# Patient Record
Sex: Male | Born: 1955
Health system: Southern US, Community
[De-identification: ages and names within clinical notes are randomized; demographics above are authoritative.]

## PROBLEM LIST (undated history)

## (undated) DIAGNOSIS — I10 Essential (primary) hypertension: Secondary | ICD-10-CM

## (undated) DIAGNOSIS — I499 Cardiac arrhythmia, unspecified: Secondary | ICD-10-CM

## (undated) DIAGNOSIS — K611 Rectal abscess: Secondary | ICD-10-CM

## (undated) DIAGNOSIS — M109 Gout, unspecified: Secondary | ICD-10-CM

## (undated) DIAGNOSIS — R091 Pleurisy: Secondary | ICD-10-CM

## (undated) DIAGNOSIS — I509 Heart failure, unspecified: Secondary | ICD-10-CM

## (undated) DIAGNOSIS — F329 Major depressive disorder, single episode, unspecified: Secondary | ICD-10-CM

## (undated) DIAGNOSIS — I219 Acute myocardial infarction, unspecified: Secondary | ICD-10-CM

## (undated) DIAGNOSIS — B019 Varicella without complication: Secondary | ICD-10-CM

## (undated) DIAGNOSIS — E291 Testicular hypofunction: Secondary | ICD-10-CM

## (undated) DIAGNOSIS — E119 Type 2 diabetes mellitus without complications: Secondary | ICD-10-CM

## (undated) DIAGNOSIS — J449 Chronic obstructive pulmonary disease, unspecified: Secondary | ICD-10-CM

## (undated) DIAGNOSIS — I2510 Atherosclerotic heart disease of native coronary artery without angina pectoris: Secondary | ICD-10-CM

## (undated) DIAGNOSIS — G4730 Sleep apnea, unspecified: Secondary | ICD-10-CM

## (undated) DIAGNOSIS — Z92241 Personal history of systemic steroid therapy: Secondary | ICD-10-CM

## (undated) DIAGNOSIS — F32A Depression, unspecified: Secondary | ICD-10-CM

## (undated) DIAGNOSIS — E785 Hyperlipidemia, unspecified: Secondary | ICD-10-CM

## (undated) DIAGNOSIS — J45909 Unspecified asthma, uncomplicated: Secondary | ICD-10-CM

## (undated) DIAGNOSIS — I739 Peripheral vascular disease, unspecified: Secondary | ICD-10-CM

## (undated) HISTORY — PX: CORONARY ARTERY BYPASS GRAFT: SHX141

## (undated) HISTORY — PX: CARDIAC CATHETERIZATION: SHX172

## (undated) HISTORY — DX: Cardiac arrhythmia, unspecified: I49.9

## (undated) HISTORY — DX: Heart failure, unspecified: I50.9

## (undated) HISTORY — PX: TOE AMPUTATION: SHX809

## (undated) HISTORY — PX: KNEE ARTHROSCOPY: SUR90

## (undated) HISTORY — DX: Acute myocardial infarction, unspecified: I21.9

## (undated) HISTORY — PX: ABDOMINAL AORTIC ANEURYSM REPAIR: SUR1152

## (undated) HISTORY — PX: TONSILLECTOMY: SUR1361

## (undated) HISTORY — DX: Chronic obstructive pulmonary disease, unspecified: J44.9

## (undated) NOTE — *Deleted (*Deleted)
NAME: [name].  DOB: July 14, 1955  MRN: [id number]  Date/Time: 02/24/2020 3:44 PM  REQUESTING PROVIDER Subjective:  REASON FOR CONSULT:  ? [name]. is a 41 y.o. with a history of DM, HTN, HLD CAD, CABG, recent PCA 10/6 for occluded RCA, presents with left foot infection He has had a foot ulcer on the plantar aspect for the past month In sept he was treated with cipro and doxy for 10 days for a wound following a scratch He presented to the ED as he was develping redness and warmth and fever BP 02/23/20 0930 (!) 122/102     Pulse Rate 02/23/20 0930 96     Resp 02/23/20 0930 20     Temp 02/23/20 0930 99.9 F (37.7 C)     Temp Source 02/23/20 0930 Oral     SpO2 02/23/20 0930 94 %     Weight 02/23/20 0931 293 lb 3.4 oz (133 kg)     Past Medical History:  Diagnosis Date  . Asthma   . Coronary artery disease   . Depression   . Diabetes mellitus without complication (HCC)   . Gout   . History anabolic steroid use   . Hyperlipidemia   . Hypertension   . Hypogonadism in male   . Morbid obesity (HCC)   . Myocardial infarction (HCC)   . Peripheral vascular disease (HCC)   . Perirectal abscess   . Pleurisy   . Sleep apnea    CPAP at night, no oxygen  . Varicella     Past Surgical History:  Procedure Laterality Date  . ABDOMINAL AORTIC ANEURYSM REPAIR    . AMPUTATION TOE Right 02/10/2016   Procedure: AMPUTATION TOE 3RD TOE;  Surgeon: Gwyneth Revels, DPM;  Location: [facility] ORS;  Service: Podiatry;  Laterality: Right;  . COLONOSCOPY WITH PROPOFOL [name] 11/18/2015   Procedure: COLONOSCOPY WITH PROPOFOL;  Surgeon: Scot Jun, MD;  Location: [facility] ENDOSCOPY;  Service: Endoscopy;  Laterality: [name];  . CORONARY ARTERY BYPASS GRAFT    . CORONARY STENT INTERVENTION [name] 02/02/2020   Procedure: CORONARY STENT INTERVENTION;  Surgeon: Marcina Millard, MD;  Location: [facility] INVASIVE CV LAB;  Service: Cardiovascular;  Laterality: [name];  . KNEE ARTHROSCOPY    . LEFT  HEART CATH AND CORS/GRAFTS ANGIOGRAPHY [name] 02/02/2020   Procedure: LEFT HEART CATH AND CORS/GRAFTS ANGIOGRAPHY;  Surgeon: Dalia Heading, MD;  Location: [facility] INVASIVE CV LAB;  Service: Cardiovascular;  Laterality: [name];  . PERIPHERAL VASCULAR CATHETERIZATION Right 01/24/2016   Procedure: Lower Extremity Angiography;  Surgeon: Renford Dills, MD;  Location: [facility] INVASIVE CV LAB;  Service: Cardiovascular;  Laterality: Right;  . PERIPHERAL VASCULAR CATHETERIZATION Right 01/25/2016   Procedure: Lower Extremity Angiography;  Surgeon: Renford Dills, MD;  Location: [facility] INVASIVE CV LAB;  Service: Cardiovascular;  Laterality: Right;  . TOE AMPUTATION    . TONSILLECTOMY      Social History   Socioeconomic History  . Marital status: Widowed    Spouse name: Not on file  . Number of children: Not on file  . Years of education: Not on file  . Highest education level: Not on file  Occupational History  . Not on file  Tobacco Use  . Smoking status: Former Smoker    Packs/day: 0.50    Years: 45.00    Pack years: 22.50    Types: Cigarettes    Quit date: 04/05/2015    Years since quitting: 4.8  . Smokeless tobacco: Never Used  Vaping  Use  . Vaping Use: Every day  Substance and Sexual Activity  . Alcohol use: Not Currently    Alcohol/week: 3.0 standard drinks    Types: 3 Glasses of wine per week  . Drug use: No  . Sexual activity: Not on file  Other Topics Concern  . Not on file  Social History Narrative  . Not on file   Social Determinants of Health   Financial Resource Strain:   . Difficulty of Paying Living Expenses: Not on file  Food Insecurity:   . Worried About Programme researcher, broadcasting/film/video in the Last Year: Not on file  . Ran Out of Food in the Last Year: Not on file  Transportation Needs:   . Lack of Transportation (Medical): Not on file  . Lack of Transportation (Non-Medical): Not on file  Physical Activity:   . Days of Exercise per Week: Not on file  . Minutes of Exercise per  Session: Not on file  Stress:   . Feeling of Stress : Not on file  Social Connections:   . Frequency of Communication with Friends and Family: Not on file  . Frequency of Social Gatherings with Friends and Family: Not on file  . Attends Religious Services: Not on file  . Active Member of Clubs or Organizations: Not on file  . Attends Banker Meetings: Not on file  . Marital Status: Not on file  Intimate Partner Violence:   . Fear of Current or Ex-Partner: Not on file  . Emotionally Abused: Not on file  . Physically Abused: Not on file  . Sexually Abused: Not on file    History reviewed. No pertinent family history. No Known Allergies ID  Recent  Procedure Surgery Injections Trauma Sick contacts Travel Antibiotic use Food- raw/exotic Steroid/immune suppressants/splenectomy/Hardware Animal bites Tick exposure Water sports Fishing/hunting/animal bird exposure ? Current Facility-Administered Medications  Medication Dose Route Frequency Provider Last Rate Last Admin  . [START ON 02/25/2020] 0.9 %  sodium chloride infusion   Intravenous Continuous Stegmayer, Kimberly A, PA-C      . 0.9 %  sodium chloride infusion   Intravenous Continuous Stegmayer, Kimberly A, PA-C      . acetaminophen (TYLENOL) tablet 650 mg  650 mg Oral Q6H PRN Howerter, Justin B, DO       Or  . acetaminophen (TYLENOL) suppository 650 mg  650 mg Rectal Q6H PRN Howerter, Justin B, DO      . acidophilus (RISAQUAD) capsule 1 capsule  1 capsule Oral Daily Howerter, Justin B, DO      . apixaban (ELIQUIS) tablet 5 mg  5 mg Oral BID Howerter, Justin B, DO   5 mg at 02/23/20 2103  . aspirin EC tablet 81 mg  81 mg Oral Daily Howerter, Justin B, DO      . atorvastatin (LIPITOR) tablet 40 mg  40 mg Oral QHS Howerter, Justin B, DO   40 mg at 02/23/20 2103  . clopidogrel (PLAVIX) tablet 75 mg  75 mg Oral Q breakfast Howerter, Justin B, DO      . HYDROcodone-acetaminophen (NORCO/VICODIN) 5-325 MG per tablet 1  tablet  1 tablet Oral Q6H PRN Howerter, Justin B, DO   1 tablet at 02/23/20 2109  . insulin aspart (novoLOG) injection 0-15 Units  0-15 Units Subcutaneous TID WC Howerter, Justin B, DO   11 Units at 02/24/20 1227  . insulin glargine (LANTUS) injection 20 Units  20 Units Subcutaneous BID Lynn Ito, MD      .  metoprolol tartrate (LOPRESSOR) tablet 50 mg  50 mg Oral BID Howerter, Justin B, DO   50 mg at 02/24/20 0827  . ondansetron (ZOFRAN) injection 4 mg  4 mg Intravenous Q6H PRN Howerter, Justin B, DO      . piperacillin-tazobactam (ZOSYN) IVPB 3.375 g  3.375 g Intravenous Q8H Foye Deer, RPH 12.5 mL/hr at 02/24/20 0834 3.375 g at 02/24/20 0834  . povidone-iodine 10 % swab 2 application  2 application Topical Once Gwyneth Revels, DPM      . pramipexole (MIRAPEX) tablet 1 mg  1 mg Oral QHS Howerter, Justin B, DO   1 mg at 02/23/20 2103  . primidone (MYSOLINE) tablet 250 mg  250 mg Oral BID Howerter, Justin B, DO   250 mg at 02/23/20 2104  . tamsulosin (FLOMAX) capsule 0.4 mg  0.4 mg Oral Daily Howerter, Justin B, DO      . vancomycin (VANCOCIN) IVPB 1000 mg/200 mL premix  1,000 mg Intravenous Q12H Foye Deer, RPH 200 mL/hr at 02/24/20 1237 1,000 mg at 02/24/20 1237   Facility-Administered Medications Ordered in Other Encounters  Medication Dose Route Frequency Provider Last Rate Last Admin  . sodium chloride flush (NS) 0.9 % injection 3 mL  3 mL Intravenous Q12H Dalia Heading, MD         Abtx:  Anti-infectives (From admission, onward)   Start     Dose/Rate Route Frequency Ordered Stop   02/23/20 2000  vancomycin (VANCOCIN) IVPB 1000 mg/200 mL premix        1,000 mg 200 mL/hr over 60 Minutes Intravenous Every 12 hours 02/23/20 1602     02/23/20 1800  piperacillin-tazobactam (ZOSYN) IVPB 3.375 g        3.375 g 12.5 mL/hr over 240 Minutes Intravenous Every 8 hours 02/23/20 1600     02/23/20 1030  vancomycin (VANCOCIN) IVPB 1000 mg/200 mL premix        1,000 mg 200 mL/hr over 60  Minutes Intravenous  Once 02/23/20 1025 02/23/20 1302   02/23/20 1030  piperacillin-tazobactam (ZOSYN) IVPB 3.375 g        3.375 g 100 mL/hr over 30 Minutes Intravenous  Once 02/23/20 1025 02/23/20 1159      REVIEW OF SYSTEMS:  Const: negative fever, negative chills, negative weight loss Eyes: negative diplopia or visual changes, negative eye pain ENT: negative coryza, negative sore throat Resp: negative cough, hemoptysis, dyspnea Cards: negative for chest pain, palpitations, lower extremity edema GU: negative for frequency, dysuria and hematuria GI: Negative for abdominal pain, diarrhea, bleeding, constipation Skin: negative for rash and pruritus Heme: negative for easy bruising and gum/nose bleeding MS: negative for myalgias, arthralgias, back pain and muscle weakness Neurolo:negative for headaches, dizziness, vertigo, memory problems  Psych: negative for feelings of anxiety, depression  Endocrine: negative for thyroid, diabetes Allergy/Immunology- negative for any medication or food allergies ? Pertinent Positives include : Objective:  VITALS:  BP 119/70 (BP Location: Right Arm)   Pulse 92   Temp 99.8 F (37.7 C) (Oral)   Resp 18   Ht 6\' 4"  (1.93 m)   Wt (!) 139.7 kg   SpO2 97%   BMI 37.49 kg/m  PHYSICAL EXAM:  General: Alert, cooperative, no distress, appears stated age.  Head: Normocephalic, without obvious abnormality, atraumatic. Eyes: Conjunctivae clear, anicteric sclerae. Pupils are equal ENT Nares normal. No drainage or sinus tenderness. Lips, mucosa, and tongue normal. No Thrush Neck: Supple, symmetrical, no adenopathy, thyroid: non tender no carotid bruit and no  JVD. Back: No CVA tenderness. Lungs: Clear to auscultation bilaterally. No Wheezing or Rhonchi. No rales. Heart: Regular rate and rhythm, no murmur, rub or gallop. Abdomen: Soft, non-tender,not distended. Bowel sounds normal. No masses Extremities:       atraumatic, no cyanosis. No edema. No  clubbing Skin: No rashes or lesions. Or bruising Lymph: Cervical, supraclavicular normal. Neurologic: Grossly non-focal Pertinent Labs Lab Results CBC    Component Value Date/Time   WBC 14.3 (H) 02/24/2020 0429   RBC 3.70 (L) 02/24/2020 0429   HGB 11.4 (L) 02/24/2020 0429   HGB 14.7 07/12/2011 0919   HCT 34.0 (L) 02/24/2020 0429   HCT 45.4 07/12/2011 0919   PLT 155 02/24/2020 0429   PLT 156 07/12/2011 0919   MCV 91.9 02/24/2020 0429   MCV 96 07/12/2011 0919   MCH 30.8 02/24/2020 0429   MCHC 33.5 02/24/2020 0429   RDW 13.2 02/24/2020 0429   RDW 15.2 (H) 07/12/2011 0919   LYMPHSABS 0.9 02/24/2020 0429   LYMPHSABS 1.2 07/12/2011 0919   MONOABS 1.1 (H) 02/24/2020 0429   MONOABS 0.8 (H) 07/12/2011 0919   EOSABS 0.1 02/24/2020 0429   EOSABS 0.4 07/12/2011 0919   BASOSABS 0.1 02/24/2020 0429   BASOSABS 0.1 07/12/2011 0919    CMP Latest Ref Rng & Units 02/24/2020 02/23/2020 02/03/2020  Glucose 70 - 99 mg/dL 161(W) 960(A) 540(J)  BUN 8 - 23 mg/dL 81(X) 91(Y) 16  Creatinine 0.61 - 1.24 mg/dL 7.82(N) 5.62(Z) 3.08  Sodium 135 - 145 mmol/L 130(L) 131(L) 138  Potassium 3.5 - 5.1 mmol/L 4.1 4.6 4.4  Chloride 98 - 111 mmol/L 98 96(L) 100  CO2 22 - 32 mmol/L 24 25 28   Calcium 8.9 - 10.3 mg/dL 7.8(L) 8.4(L) 9.0  Total Protein 6.5 - 8.1 g/dL 6.5(H) 6.7 -  Total Bilirubin 0.3 - 1.2 mg/dL 0.7 0.9 -  Alkaline Phos 38 - 126 U/L 75 83 -  AST 15 - 41 U/L 15 17 -  ALT 0 - 44 U/L 16 19 -      Microbiology: Recent Results (from the past 240 hour(s))  Culture, blood (routine x 2)     Status: None (Preliminary result)   Collection Time: 02/23/20 10:58 AM   Specimen: BLOOD  Result Value Ref Range Status   Specimen Description BLOOD BLOOD RIGHT FOREARM  Final   Special Requests   Final    BOTTLES DRAWN AEROBIC AND ANAEROBIC Blood Culture adequate volume   Culture   Final    NO GROWTH < 24 HOURS Performed at [facility], [address], [place], [place] [address]    Report  Status PENDING  Incomplete  Culture, blood (routine x 2)     Status: None (Preliminary result)   Collection Time: 02/23/20 10:58 AM   Specimen: BLOOD  Result Value Ref Range Status   Specimen Description BLOOD Blood Culture adequate volume  Final   Special Requests   Final    BOTTLES DRAWN AEROBIC AND ANAEROBIC LEFT ANTECUBITAL   Culture   Final    NO GROWTH < 24 HOURS Performed at [facility], [address], [place], [place] [address]    Report Status PENDING  Incomplete  Respiratory Panel by RT PCR (Flu A&B, Covid) - Nasopharyngeal Swab     Status: None   Collection Time: 02/23/20 11:59 AM   Specimen: Nasopharyngeal Swab  Result Value Ref Range Status   SARS Coronavirus 2 by RT PCR NEGATIVE NEGATIVE Final    Comment: (NOTE) SARS-CoV-2  target nucleic acids are NOT DETECTED.  The SARS-CoV-2 RNA is generally detectable in upper respiratoy specimens during the acute phase of infection. The lowest concentration of SARS-CoV-2 viral copies this assay can detect is 131 copies/mL. A negative result does not preclude SARS-Cov-2 infection and should not be used as the sole basis for treatment or other patient management decisions. A negative result may occur with  improper specimen collection/handling, submission of specimen other than nasopharyngeal swab, presence of viral mutation(s) within the areas targeted by this assay, and inadequate number of viral copies (<131 copies/mL). A negative result must be combined with clinical observations, patient history, and epidemiological information. The expected result is Negative.  Fact Sheet for Patients:  https://www.moore.com/  Fact Sheet for Healthcare Providers:  https://www.young.biz/  This test is no t yet approved or cleared by the [place] FDA and  has been authorized for detection and/or diagnosis of SARS-CoV-2 by FDA under an Emergency Use Authorization (EUA). This EUA will  remain  in effect (meaning this test can be used) for the duration of the COVID-19 declaration under Section 564(b)(1) of the Act, 21 U.S.C. section 360bbb-3(b)(1), unless the authorization is terminated or revoked sooner.     Influenza A by PCR NEGATIVE NEGATIVE Final   Influenza B by PCR NEGATIVE NEGATIVE Final    Comment: (NOTE) The Xpert Xpress SARS-CoV-2/FLU/RSV assay is intended as an aid in  the diagnosis of influenza from Nasopharyngeal swab specimens and  should not be used as a sole basis for treatment. Nasal washings and  aspirates are unacceptable for Xpert Xpress SARS-CoV-2/FLU/RSV  testing.  Fact Sheet for Patients: https://www.moore.com/  Fact Sheet for Healthcare Providers: https://www.young.biz/  This test is not yet approved or cleared by the [place] FDA and  has been authorized for detection and/or diagnosis of SARS-CoV-2 by  FDA under an Emergency Use Authorization (EUA). This EUA will remain  in effect (meaning this test can be used) for the duration of the  Covid-19 declaration under Section 564(b)(1) of the Act, 21  U.S.C. section 360bbb-3(b)(1), unless the authorization is  terminated or revoked. Performed at [facility], [address] [place], [place] [address]   Surgical pcr screen     Status: None   Collection Time: 02/24/20 12:25 PM   Specimen: Nasal Mucosa; Nasal Swab  Result Value Ref Range Status   MRSA, PCR NEGATIVE NEGATIVE Final   Staphylococcus aureus NEGATIVE NEGATIVE Final    Comment: (NOTE) The Xpert SA Assay (FDA approved for NASAL specimens in patients 66 years of age and older), is one component of a comprehensive surveillance program. It is not intended to diagnose infection nor to guide or monitor treatment. Performed at [facility], [address] [place], [place] [address]     IMAGING RESULTS: MRI foot Skin wound deep to the third MTP joint with  underlying cellulitis and extensive soft tissue gas. Negative for abscess, osteomyelitis or septic joint. I have personally reviewed the films ? Impression/Recommendation ? ? ? ___________________________________________________ Discussed with patient, requesting provider Note:  This document was prepared using Dragon voice recognition software and may include unintentional dictation errors.

---

## 2005-02-21 ENCOUNTER — Other Ambulatory Visit: Payer: Self-pay

## 2005-02-21 ENCOUNTER — Inpatient Hospital Stay: Payer: Self-pay | Admitting: Internal Medicine

## 2005-02-24 ENCOUNTER — Other Ambulatory Visit: Payer: Self-pay

## 2005-02-24 ENCOUNTER — Emergency Department: Payer: Self-pay | Admitting: Emergency Medicine

## 2005-03-02 ENCOUNTER — Ambulatory Visit: Payer: Self-pay | Admitting: Internal Medicine

## 2007-12-03 ENCOUNTER — Ambulatory Visit: Payer: Self-pay

## 2008-03-04 ENCOUNTER — Ambulatory Visit: Payer: Self-pay

## 2009-01-06 ENCOUNTER — Ambulatory Visit: Payer: Self-pay | Admitting: Internal Medicine

## 2009-01-19 ENCOUNTER — Ambulatory Visit: Payer: Self-pay | Admitting: Internal Medicine

## 2009-08-09 ENCOUNTER — Other Ambulatory Visit: Payer: Self-pay | Admitting: Podiatry

## 2009-08-09 ENCOUNTER — Inpatient Hospital Stay: Payer: Self-pay | Admitting: Podiatry

## 2010-06-03 ENCOUNTER — Inpatient Hospital Stay: Payer: Self-pay | Admitting: Internal Medicine

## 2010-11-06 ENCOUNTER — Ambulatory Visit: Payer: Self-pay | Admitting: Cardiology

## 2011-07-12 ENCOUNTER — Ambulatory Visit: Payer: Self-pay | Admitting: Podiatry

## 2011-07-12 LAB — CBC WITH DIFFERENTIAL/PLATELET
Basophil #: 0.1 10*3/uL (ref 0.0–0.1)
Basophil %: 0.7 %
Eosinophil #: 0.4 10*3/uL (ref 0.0–0.7)
HGB: 14.7 g/dL (ref 13.0–18.0)
Lymphocyte #: 1.2 10*3/uL (ref 1.0–3.6)
Lymphocyte %: 12.5 %
MCH: 31 pg (ref 26.0–34.0)
MCHC: 32.4 g/dL (ref 32.0–36.0)
MCV: 96 fL (ref 80–100)
Monocyte #: 0.8 10*3/uL — ABNORMAL HIGH (ref 0.0–0.7)
Neutrophil #: 7.5 10*3/uL — ABNORMAL HIGH (ref 1.4–6.5)
Platelet: 156 10*3/uL (ref 150–440)
RDW: 15.2 % — ABNORMAL HIGH (ref 11.5–14.5)
WBC: 10 10*3/uL (ref 3.8–10.6)

## 2011-07-12 LAB — BASIC METABOLIC PANEL
Anion Gap: 10 (ref 7–16)
BUN: 19 mg/dL — ABNORMAL HIGH (ref 7–18)
Co2: 29 mmol/L (ref 21–32)
Creatinine: 0.74 mg/dL (ref 0.60–1.30)
EGFR (Non-African Amer.): >60
Glucose: 76 mg/dL (ref 65–99)
Osmolality: 282 (ref 275–301)
Potassium: 4.4 mmol/L (ref 3.5–5.1)
Sodium: 141 mmol/L (ref 136–145)

## 2011-07-13 ENCOUNTER — Ambulatory Visit: Payer: Self-pay | Admitting: Podiatry

## 2011-07-17 LAB — WOUND CULTURE

## 2011-07-17 LAB — PATHOLOGY REPORT

## 2011-07-21 LAB — WOUND CULTURE

## 2013-03-20 ENCOUNTER — Ambulatory Visit: Payer: Self-pay | Admitting: Internal Medicine

## 2013-04-02 ENCOUNTER — Ambulatory Visit: Payer: Self-pay | Admitting: Physician Assistant

## 2013-05-05 ENCOUNTER — Ambulatory Visit: Payer: Self-pay | Admitting: Vascular Surgery

## 2013-05-05 LAB — CREATININE, SERUM
CREATININE: 0.71 mg/dL (ref 0.60–1.30)
EGFR (African American): >60
EGFR (Non-African Amer.): >60

## 2013-05-05 LAB — BUN: BUN: 16 mg/dL (ref 7–18)

## 2013-07-20 DIAGNOSIS — E7800 Pure hypercholesterolemia, unspecified: Secondary | ICD-10-CM | POA: Insufficient documentation

## 2013-07-20 DIAGNOSIS — G4710 Hypersomnia, unspecified: Secondary | ICD-10-CM | POA: Insufficient documentation

## 2013-07-20 DIAGNOSIS — I25810 Atherosclerosis of coronary artery bypass graft(s) without angina pectoris: Secondary | ICD-10-CM | POA: Insufficient documentation

## 2013-08-17 ENCOUNTER — Ambulatory Visit: Payer: Self-pay | Admitting: General Practice

## 2013-08-21 ENCOUNTER — Ambulatory Visit: Payer: Self-pay | Admitting: General Practice

## 2013-08-21 DIAGNOSIS — Z01810 Encounter for preprocedural cardiovascular examination: Secondary | ICD-10-CM

## 2013-08-28 ENCOUNTER — Ambulatory Visit: Payer: Self-pay

## 2013-09-10 ENCOUNTER — Ambulatory Visit: Payer: Self-pay | Admitting: Vascular Surgery

## 2013-09-29 DIAGNOSIS — E669 Obesity, unspecified: Secondary | ICD-10-CM | POA: Insufficient documentation

## 2013-09-29 DIAGNOSIS — E538 Deficiency of other specified B group vitamins: Secondary | ICD-10-CM | POA: Insufficient documentation

## 2013-12-27 DIAGNOSIS — R809 Proteinuria, unspecified: Secondary | ICD-10-CM | POA: Insufficient documentation

## 2013-12-27 DIAGNOSIS — E11649 Type 2 diabetes mellitus with hypoglycemia without coma: Secondary | ICD-10-CM | POA: Insufficient documentation

## 2013-12-27 DIAGNOSIS — Z794 Long term (current) use of insulin: Secondary | ICD-10-CM | POA: Insufficient documentation

## 2014-08-21 NOTE — Op Note (Signed)
PATIENT NAME:  [name], [name] MR#:  [id number] DATE OF BIRTH:  11/16/1955  DATE OF PROCEDURE:  05/05/2013  PREOPERATIVE DIAGNOSES: 1.  Atherosclerotic occlusive disease, bilateral lower extremities.  2.  Symptomatic painful popliteal artery aneurysm, right side.  3.  Morbid obesity.  4.  Sleep apnea.  5.  Restrictive airway disease.   POSTOPERATIVE DIAGNOSES: 1.  Atherosclerotic occlusive disease, bilateral lower extremities.  2.  Symptomatic painful popliteal artery aneurysm, right side.  3.  Morbid obesity.  4.  Sleep apnea.  5.  Restrictive airway disease.   PROCEDURES PERFORMED: 1. Arterial arteriography of the right lower extremity, third order catheter placement.  2.  Percutaneous transluminal angioplasty of the right anterior tibial artery to 4 mm using  Lutonix balloon.  3.  Exclusion of right popliteal artery aneurysm, using Viabahn endo prostheses.   SURGEON: Katha Cabal, M.D.   SEDATION:  Precedex drip with IV fentanyl.   ACCESS: 87 French sheath, antegrade direction, right superficial femoral artery.   CONTRAST USED: Isovue 85 mL.   FLUORO TIME: 4.1 minutes.   INDICATIONS: [name] is a 59 year old gentleman with multiple medical problems who presents with increasing pain in his right lower extremity. He has evidence of a large popliteal artery aneurysm associated with mural thrombus. In addition, he has apparent occlusive disease of the tibial vessels, which could be a result of the aneurysm and the thrombus within it. He is having increasing pain and therefore he has elected to proceed with repair. Risks and benefits were reviewed. All questions are answered. The patient agrees to proceed.   DESCRIPTION OF PROCEDURE: The patient is taken to the special procedure suite and placed in the supine position. After adequate sedation on a Precedex drip with IV fentanyl has been achieved, his right thigh and groin area are prepped and draped in a sterile fashion.  Appropriate timeout is called.   Ultrasound is placed in a sterile sleeve. Ultrasound is utilized secondary to lack of appropriate landmarks and to avoid vascular injury. Under direct ultrasound visualization, the superficial femoral artery is identified. It is echolucent and pulsatile indicating patency. Image is recorded for the permanent record. Lidocaine 1% is infiltrated in the soft tissues and access to the femoral artery is obtained with a micropuncture needle, microwire followed by microsheath, Amplatz wire, followed by placement of a 6 French sheath. The Perclose devices are then used to in a pre- close fashion using 2 devices, placing them at the 1 o'clock and 11 o'clock positions. The sutures are then clamped with a curved hemostat and then a straight hemostat, and an 8 French sheath is inserted over the wire. Angiography is then performed demonstrating the popliteal artery aneurysm. There is also an associated narrowing just proximal to the popliteal artery aneurysm of about 60%. There is also noted to be a greater than 70% stenosis of the anterior tibial in its proximal one third. The peroneal is patent. The posterior tibial occludes throughout the majority of its course.   Heparin 4000 units is given and allowed to circulate and the sheath is upsized over the Amplatz wire to 12 [place] sheath. Wire is negotiated into the anterior tibial and a 4 x 6 Lutonix balloon is advanced across the lesion. It is inflated to 10 atmospheres for 3 full minutes. Follow-up angiography demonstrates complete resolution of the narrowing with less than 5% residual stenosis. No evidence of dissection. Distal runoff was maintained.   Over the 0.035 wire, it is then elected to use  2 Viabahn stents to exclude the popliteal artery aneurysm, an 8 x 5 is positioned distally and the distal popliteal approximately 1 cm above the origin of the anterior tibial. It is then deployed without difficulty. Subsequently, a 10 x 15  Viabahn prosthesis is deployed overlapping the 10 and then 8 by approximately 2 cm. Subsequently, a 10 x 4 balloon is advanced over the wire and inflated to seal the endoprostheses distally within the 8 mm segment. The balloon is inflated to nominal, which is only 4 atmospheres. It is then inflated to 8 to 10 atmospheres throughout the entirety of the 10 mm endoprosthesis.   Follow-up angiography through the sheath demonstrates that there is an excellent seal. There is no endoleak noted. There is excellent overlap of the endoprostheses and again, the anterior tibial is widely patent.   The sheath is removed over the wire and both Perclose devices are secured. A mild amount of oozing is noted, but it is nonpulsatile and the wire is removed. The knots are then secured and then pressure is held for approximately 15 minutes. At the conclusion of this, there is no evidence of any bleeding or hematoma. There are no immediate complications and on the table, the patient now has a 4+ bounding dorsalis pedis pulse on the right.   INTERPRETATION: Initial views of the right lower extremity demonstrate moderate to severe stenosis in the distal SFA at Hunter's canal. There is aneurysm noted in the popliteal distribution. Trifurcation is patent; however, there is a high-grade stenosis in the proximal one third of the anterior tibial. Peroneal is widely patent. The posterior tibial occludes in its midportion and remains occluded for the majority of its length.   Following angioplasty of 4 mm, there is resolution of the anterior tibial lesion following endograft placement. There is complete exclusion of the popliteal artery aneurysm.   SUMMARY: Successful exclusion of popliteal artery aneurysm with recanalization of the anterior tibial for treatment of his vascular disease.     ____________________________ Katha Cabal, MD ggs:dp D: 05/06/2013 09:20:55 ET T: 05/06/2013 09:51:09 ET JOB#: [id number]  cc: Katha Cabal, MD, <Dictator> Leonie Douglas. Doy Hutching, MD Katha Cabal MD ELECTRONICALLY SIGNED 05/14/2013 9:46

## 2014-08-21 NOTE — Op Note (Signed)
PATIENT NAME:  [name], [name] MR#:  [id number] DATE OF BIRTH:  1955-10-09  DATE OF PROCEDURE:  08/21/2013  PREOPERATIVE DIAGNOSIS: Internal derangement of the right knee.   POSTOPERATIVE DIAGNOSIS: 1. Tear of the posterior horn of the medial meniscus, right knee.  2. Tear of the posterior horn of lateral meniscus, right knee.  3. A grade III chondromalacia involving medial, lateral and patellofemoral compartments.   PROCEDURE PERFORMED: Right knee arthroscopy, partial medial and lateral meniscectomies and chondroplasty of the medial, lateral and patellofemoral compartments.   SURGEON: Laurice Record. Hooten, MD   ANESTHESIA: General.   ESTIMATED BLOOD LOSS: Minimal.   TOURNIQUET TIME: Not used.   DRAINS: None.   INDICATIONS FOR SURGERY: The patient is a 59 year old male who has been seen for complaints of persistent right knee pain. MRI demonstrated findings consistent with meniscal pathology. After discussion of the risks and benefits of surgical intervention, the patient expressed understanding of the risks, benefits, and agreed with plans for surgical intervention.   PROCEDURE IN DETAIL: The patient was brought to the Operating Room and, after adequate general anesthesia was achieved, a tourniquet was placed on the patient's right thigh, and the leg was placed in a leg holder. All bony prominences were well padded. The patient's right knee and leg were cleaned and prepped with alcohol and DuraPrep draped in the usual sterile fashion. A "timeout" was performed as per usual protocol. The anticipated portal sites were injected with 0.25% Marcaine with epinephrine. An anterolateral portal was created and a cannula was inserted. The scope was inserted and the knee was distended with fluid using the pump. The scope was advanced down the medial gutter into the medial compartment of the knee. Under visualization with the scope, an anteromedial portal was created and hook probe was inserted. Inspection  of the medial compartment demonstrated a degenerative tear of the posterior horn with the radial component. The meniscal tear was debrided using meniscal punches and a 4.5 mm shaver. Final contouring was performed using the ArthroCare wand. The remaining rim of posterior horn was visualized and probed and felt to be stable. The anterior horn was visualized and probed and also felt to be stable. There was an area of grade II to III chondromalacia involving primarily the medial femoral condyle with corresponding lesion to the medial tibial plateau. These areas were debrided and contoured using the ArthroCare wand. Scope was then advanced into the intracondylar region. The anterior cruciate ligament was visualized and probed and felt to be stable. The scope was removed from the anterolateral portal and we reinserted via the anteromedial portal so as to better visualize the lateral compartment. There was a degenerative tear involving the posterior horn of the lateral meniscus. The tear was debrided using meniscal punches and a 4.5 mm shaver. Final contouring was performed using the 50 degree ArthroCare wand. The remaining rim of meniscus was visualized and probed and felt to be stable. There was an area of grade III chondromalacia involving the anterior aspect of the lateral femoral condyle. This area was debrided and contoured using the ArthroCare wand. Finally, the scope was positioned so as to visualize the patellofemoral articulation. Grade 2 to 3 changes of chondromalacia were noted in the intracondylar notch with lesser changes to the facets of the patella. These areas were debrided using ArthroCare wand. Good patellar tracking was noted.   The knee was irrigated with copious amounts of fluid and then suctioned dry. The anterolateral portal was reapproximated using 3-0 nylon. A combination  of 0.25% Marcaine with epinephrine and 4 mg morphine was injected via the scope. The scope was removed and the anteromedial  portal was reapproximated using 3-0 nylon. Sterile dressing was applied followed by application of an ice wrap.   The patient tolerated the procedure well. He was transported to the recovery room in stable condition.   ____________________________ Laurice Record. Holley Bouche., MD jph:sg D: 08/22/2013 19:26:05 ET T: 08/23/2013 06:43:59 ET JOB#: [id number]  cc: Laurice Record. Holley Bouche., MD, <Dictator> JAMES P Holley Bouche MD ELECTRONICALLY SIGNED 08/24/2013 0:46

## 2014-08-22 NOTE — Op Note (Signed)
PATIENT NAME:  [name], [name] MR#:  [id number] DATE OF BIRTH:  [birth year]  DATE OF PROCEDURE:  07/13/2011  PREOPERATIVE DIAGNOSIS: Osteomyelitis right foot second toe.   POSTOPERATIVE DIAGNOSIS: Osteomyelitis right foot second toe.   PROCEDURE PERFORMED: Insertion of left arm basilic PICC line.   SURGEON: Katha Cabal, MD  INDICATIONS: The patient is a 59 year old gentleman with osteomyelitis who will require IV antibiotics for greater than seven days. He is therefore undergoing placement of a PICC line so that he can receive his needed therapy.   DESCRIPTION OF PROCEDURE: Patient is taken to special procedures, placed in supine position. The right arm is extended palm upward. Ultrasound is placed in a sterile sleeve. Ultrasound is utilized secondary to lack of appropriate landmarks and to avoid vascular injury. Under direct ultrasound visualization basilic vein is identified. It is echolucent, homogeneous, easily compressible indicating patency. Image is recorded for the permanent record. Access to the basilic vein is then obtained with real-time ultrasound visualization using a micropuncture needle, microwire followed by micro sheath. Micro sheath is then advanced under fluoroscopic guidance so that the tip is in the SVC. Measurements are made and a single-lumen Morpheus PICC line is trimmed to 49 cm. Dilator and peel-away sheath are then inserted and the PICC line is advanced over the wire through the peel-away sheath. Wire and peel-away sheath are removed and the PICC line is secured to the arm with a dressing. PICC line aspirates and flushes easily.   ____________________________ Katha Cabal, MD ggs:cms D: 07/13/2011 16:22:52 ET T: 07/13/2011 16:51:29 ET JOB#: [id number]  cc: Katha Cabal, MD, <Dictator> Katha Cabal MD ELECTRONICALLY SIGNED 07/17/2011 12:22

## 2014-08-22 NOTE — Op Note (Signed)
PATIENT NAME:  [name], [name] MR#:  [id number] DATE OF BIRTH:  [birth year]  DATE OF PROCEDURE:  07/13/2011  PREOPERATIVE DIAGNOSIS: Right second toe and joint osteomyelitis.   POSTOPERATIVE DIAGNOSIS: Right second toe and joint osteomyelitis.   PROCEDURE PERFORMED: Right second ray amputation.   SURGEON: Ameira Alessandrini A. Vickki Muff, DPM  ANESTHESIA: MAC with local.   HEMOSTASIS: Epinephrine infiltrated along incision site.   COMPLICATIONS: None.   SPECIMENS:  1. Second toe and metatarsal for pathology.  2. Metatarsal as clean margin, second metatarsal.  3. Swab of second toe and joint.  4. Bone from second toe for culture.   OPERATIVE INDICATIONS: This is a 59 year old gentleman brought into the OR for infection of the second toe and joint. He had noted erosive changes on his x-ray with purulent drainage directly from the toe and joint with severe erythema and edema. We discussed conservative versus surgical intervention. He understands he will need surgical intervention for eradication of most of the infected toe as well as long-term antibiotics.   DESCRIPTION OF PROCEDURE: Patient was brought into the Operating Room, placed on the operating table in the supine position. IV sedation was administered by the anesthesia team. A local block was placed around the patient's right second ray. Incision was made initially on the dorsal aspect of the second MTPJ with sharp and blunt dissection taken down to the joint itself. There was noted to be purulent drainage from the second toe joint coursing around the proximal phalanx and toe region. At this time, I made the decision to perform the amputation of the second toe and removal of the bone from the metatarsal head. Next, a racket-type of incision was made circumferential around the second toe at the metatarsophalangeal joint. Full thickness dissection was carried down to the joint. The toe was then disarticulated at the MTPJ. At this time, at the surgical  neck of the metatarsal, a power saw was used to remove the metatarsal head. This was all sent for pathological examination. A portion of the proximal phalanx was removed and sent for culture of the bone. A swab of the infectious material was also sent for culture. At this time, the wound was then flushed with copious amounts of irrigation. No further infectious process was noted medial, lateral or proximal. Closure was performed of the skin with 3-0 Vicryl and 2-0 nylon. The proximal margin was packed with half-inch iodoform gauze packing. He tolerated the procedure and anesthesia well, was transported from the Operating Room to the PAC-U with all vital signs stable and neurovascular status intact. I will see him in the outpatient clinic in 5 to 7 days. He will be on oral antibiotics for now. We will provide an appointment for patient to see infectious disease as the patient will need long-term IV antibiotics. Will need to set up a PICC line to be placed next week.   ____________________________ Pete Glatter. Vickki Muff, DPM jaf:cms D: 07/13/2011 13:57:59 ET T: 07/13/2011 14:04:01 ET JOB#: [id number]  cc: Larkin Ina A. Vickki Muff, DPM, <Dictator> Demeka Sutter DPM ELECTRONICALLY SIGNED 07/13/2011 15:17

## 2015-05-05 DIAGNOSIS — E1121 Type 2 diabetes mellitus with diabetic nephropathy: Secondary | ICD-10-CM | POA: Diagnosis not present

## 2015-05-05 DIAGNOSIS — E785 Hyperlipidemia, unspecified: Secondary | ICD-10-CM | POA: Diagnosis not present

## 2015-05-05 DIAGNOSIS — E538 Deficiency of other specified B group vitamins: Secondary | ICD-10-CM | POA: Diagnosis not present

## 2015-05-05 DIAGNOSIS — E8881 Metabolic syndrome: Secondary | ICD-10-CM | POA: Diagnosis not present

## 2015-05-05 DIAGNOSIS — Z794 Long term (current) use of insulin: Secondary | ICD-10-CM | POA: Diagnosis not present

## 2015-05-05 DIAGNOSIS — E1165 Type 2 diabetes mellitus with hyperglycemia: Secondary | ICD-10-CM | POA: Diagnosis not present

## 2015-05-19 DIAGNOSIS — G4710 Hypersomnia, unspecified: Secondary | ICD-10-CM | POA: Diagnosis not present

## 2015-05-19 DIAGNOSIS — E1121 Type 2 diabetes mellitus with diabetic nephropathy: Secondary | ICD-10-CM | POA: Diagnosis not present

## 2015-05-19 DIAGNOSIS — E7800 Pure hypercholesterolemia, unspecified: Secondary | ICD-10-CM | POA: Diagnosis not present

## 2015-05-19 DIAGNOSIS — E662 Morbid (severe) obesity with alveolar hypoventilation: Secondary | ICD-10-CM | POA: Diagnosis not present

## 2015-05-19 DIAGNOSIS — E118 Type 2 diabetes mellitus with unspecified complications: Secondary | ICD-10-CM | POA: Diagnosis not present

## 2015-05-19 DIAGNOSIS — G4730 Sleep apnea, unspecified: Secondary | ICD-10-CM | POA: Diagnosis not present

## 2015-05-19 DIAGNOSIS — Z794 Long term (current) use of insulin: Secondary | ICD-10-CM | POA: Diagnosis not present

## 2015-05-19 DIAGNOSIS — I10 Essential (primary) hypertension: Secondary | ICD-10-CM | POA: Diagnosis not present

## 2015-05-19 DIAGNOSIS — Z79899 Other long term (current) drug therapy: Secondary | ICD-10-CM | POA: Diagnosis not present

## 2015-05-19 DIAGNOSIS — Z125 Encounter for screening for malignant neoplasm of prostate: Secondary | ICD-10-CM | POA: Diagnosis not present

## 2015-07-19 DIAGNOSIS — I25810 Atherosclerosis of coronary artery bypass graft(s) without angina pectoris: Secondary | ICD-10-CM | POA: Diagnosis not present

## 2015-07-19 DIAGNOSIS — Z794 Long term (current) use of insulin: Secondary | ICD-10-CM | POA: Diagnosis not present

## 2015-07-19 DIAGNOSIS — E118 Type 2 diabetes mellitus with unspecified complications: Secondary | ICD-10-CM | POA: Diagnosis not present

## 2015-07-19 DIAGNOSIS — I10 Essential (primary) hypertension: Secondary | ICD-10-CM | POA: Diagnosis not present

## 2015-07-19 DIAGNOSIS — E7800 Pure hypercholesterolemia, unspecified: Secondary | ICD-10-CM | POA: Diagnosis not present

## 2015-07-21 DIAGNOSIS — Z125 Encounter for screening for malignant neoplasm of prostate: Secondary | ICD-10-CM | POA: Diagnosis not present

## 2015-07-21 DIAGNOSIS — E7800 Pure hypercholesterolemia, unspecified: Secondary | ICD-10-CM | POA: Diagnosis not present

## 2015-07-21 DIAGNOSIS — I10 Essential (primary) hypertension: Secondary | ICD-10-CM | POA: Diagnosis not present

## 2015-07-21 DIAGNOSIS — Z794 Long term (current) use of insulin: Secondary | ICD-10-CM | POA: Diagnosis not present

## 2015-07-21 DIAGNOSIS — Z79899 Other long term (current) drug therapy: Secondary | ICD-10-CM | POA: Diagnosis not present

## 2015-07-21 DIAGNOSIS — E1165 Type 2 diabetes mellitus with hyperglycemia: Secondary | ICD-10-CM | POA: Diagnosis not present

## 2015-08-04 DIAGNOSIS — E1121 Type 2 diabetes mellitus with diabetic nephropathy: Secondary | ICD-10-CM | POA: Diagnosis not present

## 2015-08-04 DIAGNOSIS — Z794 Long term (current) use of insulin: Secondary | ICD-10-CM | POA: Diagnosis not present

## 2015-08-04 DIAGNOSIS — E1165 Type 2 diabetes mellitus with hyperglycemia: Secondary | ICD-10-CM | POA: Diagnosis not present

## 2015-08-04 DIAGNOSIS — E8881 Metabolic syndrome: Secondary | ICD-10-CM | POA: Diagnosis not present

## 2015-08-04 DIAGNOSIS — E538 Deficiency of other specified B group vitamins: Secondary | ICD-10-CM | POA: Diagnosis not present

## 2015-08-04 DIAGNOSIS — E785 Hyperlipidemia, unspecified: Secondary | ICD-10-CM | POA: Diagnosis not present

## 2015-09-06 DIAGNOSIS — E538 Deficiency of other specified B group vitamins: Secondary | ICD-10-CM | POA: Diagnosis not present

## 2015-09-06 DIAGNOSIS — Z1211 Encounter for screening for malignant neoplasm of colon: Secondary | ICD-10-CM | POA: Diagnosis not present

## 2015-09-06 DIAGNOSIS — E118 Type 2 diabetes mellitus with unspecified complications: Secondary | ICD-10-CM | POA: Diagnosis not present

## 2015-09-06 DIAGNOSIS — E7800 Pure hypercholesterolemia, unspecified: Secondary | ICD-10-CM | POA: Diagnosis not present

## 2015-09-06 DIAGNOSIS — I10 Essential (primary) hypertension: Secondary | ICD-10-CM | POA: Diagnosis not present

## 2015-09-06 DIAGNOSIS — Z79899 Other long term (current) drug therapy: Secondary | ICD-10-CM | POA: Diagnosis not present

## 2015-09-06 DIAGNOSIS — J301 Allergic rhinitis due to pollen: Secondary | ICD-10-CM | POA: Diagnosis not present

## 2015-09-06 DIAGNOSIS — I25810 Atherosclerosis of coronary artery bypass graft(s) without angina pectoris: Secondary | ICD-10-CM | POA: Diagnosis not present

## 2015-09-06 DIAGNOSIS — E662 Morbid (severe) obesity with alveolar hypoventilation: Secondary | ICD-10-CM | POA: Diagnosis not present

## 2015-09-06 DIAGNOSIS — Z0000 Encounter for general adult medical examination without abnormal findings: Secondary | ICD-10-CM | POA: Diagnosis not present

## 2015-09-06 DIAGNOSIS — E11649 Type 2 diabetes mellitus with hypoglycemia without coma: Secondary | ICD-10-CM | POA: Diagnosis not present

## 2015-09-06 DIAGNOSIS — N400 Enlarged prostate without lower urinary tract symptoms: Secondary | ICD-10-CM | POA: Diagnosis not present

## 2015-09-08 DIAGNOSIS — I70219 Atherosclerosis of native arteries of extremities with intermittent claudication, unspecified extremity: Secondary | ICD-10-CM | POA: Diagnosis not present

## 2015-09-08 DIAGNOSIS — E669 Obesity, unspecified: Secondary | ICD-10-CM | POA: Diagnosis not present

## 2015-09-08 DIAGNOSIS — I714 Abdominal aortic aneurysm, without rupture: Secondary | ICD-10-CM | POA: Diagnosis not present

## 2015-09-08 DIAGNOSIS — I724 Aneurysm of artery of lower extremity: Secondary | ICD-10-CM | POA: Diagnosis not present

## 2015-09-08 DIAGNOSIS — M79609 Pain in unspecified limb: Secondary | ICD-10-CM | POA: Diagnosis not present

## 2015-09-08 DIAGNOSIS — I10 Essential (primary) hypertension: Secondary | ICD-10-CM | POA: Diagnosis not present

## 2015-09-08 DIAGNOSIS — E119 Type 2 diabetes mellitus without complications: Secondary | ICD-10-CM | POA: Diagnosis not present

## 2015-09-08 DIAGNOSIS — I739 Peripheral vascular disease, unspecified: Secondary | ICD-10-CM | POA: Diagnosis not present

## 2015-09-22 DIAGNOSIS — Z800 Family history of malignant neoplasm of digestive organs: Secondary | ICD-10-CM | POA: Diagnosis not present

## 2015-11-02 DIAGNOSIS — E1165 Type 2 diabetes mellitus with hyperglycemia: Secondary | ICD-10-CM | POA: Diagnosis not present

## 2015-11-02 DIAGNOSIS — Z79899 Other long term (current) drug therapy: Secondary | ICD-10-CM | POA: Diagnosis not present

## 2015-11-02 DIAGNOSIS — Z794 Long term (current) use of insulin: Secondary | ICD-10-CM | POA: Diagnosis not present

## 2015-11-02 DIAGNOSIS — E7800 Pure hypercholesterolemia, unspecified: Secondary | ICD-10-CM | POA: Diagnosis not present

## 2015-11-08 DIAGNOSIS — E785 Hyperlipidemia, unspecified: Secondary | ICD-10-CM | POA: Diagnosis not present

## 2015-11-08 DIAGNOSIS — E1121 Type 2 diabetes mellitus with diabetic nephropathy: Secondary | ICD-10-CM | POA: Diagnosis not present

## 2015-11-08 DIAGNOSIS — Z794 Long term (current) use of insulin: Secondary | ICD-10-CM | POA: Diagnosis not present

## 2015-11-08 DIAGNOSIS — E8881 Metabolic syndrome: Secondary | ICD-10-CM | POA: Diagnosis not present

## 2015-11-08 DIAGNOSIS — E538 Deficiency of other specified B group vitamins: Secondary | ICD-10-CM | POA: Diagnosis not present

## 2015-11-17 ENCOUNTER — Encounter: Payer: Self-pay | Admitting: *Deleted

## 2015-11-18 ENCOUNTER — Ambulatory Visit
Admission: RE | Admit: 2015-11-18 | Discharge: 2015-11-18 | Disposition: A | Discharge status: Home / Self Care | Payer: PPO | Source: Ambulatory Visit | Attending: Unknown Physician Specialty | Admitting: Unknown Physician Specialty

## 2015-11-18 ENCOUNTER — Encounter
Admission: RE | Disposition: A | Discharge status: Home / Self Care | Payer: Self-pay | Source: Ambulatory Visit | Attending: Unknown Physician Specialty

## 2015-11-18 ENCOUNTER — Ambulatory Visit: Payer: PPO | Admitting: Anesthesiology

## 2015-11-18 ENCOUNTER — Encounter: Payer: Self-pay | Admitting: *Deleted

## 2015-11-18 DIAGNOSIS — I129 Hypertensive chronic kidney disease with stage 1 through stage 4 chronic kidney disease, or unspecified chronic kidney disease: Secondary | ICD-10-CM | POA: Diagnosis not present

## 2015-11-18 DIAGNOSIS — Z951 Presence of aortocoronary bypass graft: Secondary | ICD-10-CM | POA: Insufficient documentation

## 2015-11-18 DIAGNOSIS — I2510 Atherosclerotic heart disease of native coronary artery without angina pectoris: Secondary | ICD-10-CM | POA: Diagnosis not present

## 2015-11-18 DIAGNOSIS — K648 Other hemorrhoids: Secondary | ICD-10-CM | POA: Diagnosis not present

## 2015-11-18 DIAGNOSIS — K621 Rectal polyp: Secondary | ICD-10-CM | POA: Insufficient documentation

## 2015-11-18 DIAGNOSIS — E785 Hyperlipidemia, unspecified: Secondary | ICD-10-CM | POA: Insufficient documentation

## 2015-11-18 DIAGNOSIS — Z800 Family history of malignant neoplasm of digestive organs: Secondary | ICD-10-CM | POA: Diagnosis not present

## 2015-11-18 DIAGNOSIS — K635 Polyp of colon: Secondary | ICD-10-CM | POA: Insufficient documentation

## 2015-11-18 DIAGNOSIS — M109 Gout, unspecified: Secondary | ICD-10-CM | POA: Insufficient documentation

## 2015-11-18 DIAGNOSIS — G4730 Sleep apnea, unspecified: Secondary | ICD-10-CM | POA: Insufficient documentation

## 2015-11-18 DIAGNOSIS — D125 Benign neoplasm of sigmoid colon: Secondary | ICD-10-CM | POA: Insufficient documentation

## 2015-11-18 DIAGNOSIS — J449 Chronic obstructive pulmonary disease, unspecified: Secondary | ICD-10-CM | POA: Diagnosis not present

## 2015-11-18 DIAGNOSIS — Z6841 Body Mass Index (BMI) 40.0 and over, adult: Secondary | ICD-10-CM | POA: Diagnosis not present

## 2015-11-18 DIAGNOSIS — Z79899 Other long term (current) drug therapy: Secondary | ICD-10-CM | POA: Diagnosis not present

## 2015-11-18 DIAGNOSIS — K5790 Diverticulosis of intestine, part unspecified, without perforation or abscess without bleeding: Secondary | ICD-10-CM | POA: Diagnosis not present

## 2015-11-18 DIAGNOSIS — J45909 Unspecified asthma, uncomplicated: Secondary | ICD-10-CM | POA: Insufficient documentation

## 2015-11-18 DIAGNOSIS — F329 Major depressive disorder, single episode, unspecified: Secondary | ICD-10-CM | POA: Insufficient documentation

## 2015-11-18 DIAGNOSIS — Z8679 Personal history of other diseases of the circulatory system: Secondary | ICD-10-CM | POA: Insufficient documentation

## 2015-11-18 DIAGNOSIS — Z7984 Long term (current) use of oral hypoglycemic drugs: Secondary | ICD-10-CM | POA: Insufficient documentation

## 2015-11-18 DIAGNOSIS — K5730 Diverticulosis of large intestine without perforation or abscess without bleeding: Secondary | ICD-10-CM | POA: Diagnosis not present

## 2015-11-18 DIAGNOSIS — Z1211 Encounter for screening for malignant neoplasm of colon: Secondary | ICD-10-CM | POA: Diagnosis not present

## 2015-11-18 DIAGNOSIS — F17200 Nicotine dependence, unspecified, uncomplicated: Secondary | ICD-10-CM | POA: Diagnosis not present

## 2015-11-18 DIAGNOSIS — I252 Old myocardial infarction: Secondary | ICD-10-CM | POA: Insufficient documentation

## 2015-11-18 DIAGNOSIS — D124 Benign neoplasm of descending colon: Secondary | ICD-10-CM | POA: Diagnosis not present

## 2015-11-18 DIAGNOSIS — K640 First degree hemorrhoids: Secondary | ICD-10-CM | POA: Diagnosis not present

## 2015-11-18 DIAGNOSIS — N189 Chronic kidney disease, unspecified: Secondary | ICD-10-CM | POA: Insufficient documentation

## 2015-11-18 DIAGNOSIS — E1122 Type 2 diabetes mellitus with diabetic chronic kidney disease: Secondary | ICD-10-CM | POA: Insufficient documentation

## 2015-11-18 DIAGNOSIS — I10 Essential (primary) hypertension: Secondary | ICD-10-CM | POA: Diagnosis not present

## 2015-11-18 DIAGNOSIS — D128 Benign neoplasm of rectum: Secondary | ICD-10-CM | POA: Diagnosis not present

## 2015-11-18 HISTORY — DX: Depression, unspecified: F32.A

## 2015-11-18 HISTORY — DX: Acute myocardial infarction, unspecified: I21.9

## 2015-11-18 HISTORY — DX: Unspecified asthma, uncomplicated: J45.909

## 2015-11-18 HISTORY — DX: Major depressive disorder, single episode, unspecified: F32.9

## 2015-11-18 HISTORY — DX: Testicular hypofunction: E29.1

## 2015-11-18 HISTORY — PX: COLONOSCOPY WITH PROPOFOL: SHX5780

## 2015-11-18 HISTORY — DX: Atherosclerotic heart disease of native coronary artery without angina pectoris: I25.10

## 2015-11-18 HISTORY — DX: Essential (primary) hypertension: I10

## 2015-11-18 HISTORY — DX: Hyperlipidemia, unspecified: E78.5

## 2015-11-18 HISTORY — DX: Sleep apnea, unspecified: G47.30

## 2015-11-18 HISTORY — DX: Personal history of systemic steroid therapy: Z92.241

## 2015-11-18 HISTORY — DX: Rectal abscess: K61.1

## 2015-11-18 HISTORY — DX: Varicella without complication: B01.9

## 2015-11-18 HISTORY — DX: Pleurisy: R09.1

## 2015-11-18 HISTORY — DX: Gout, unspecified: M10.9

## 2015-11-18 HISTORY — DX: Morbid (severe) obesity due to excess calories: E66.01

## 2015-11-18 HISTORY — DX: Type 2 diabetes mellitus without complications: E11.9

## 2015-11-18 LAB — GLUCOSE, CAPILLARY: Glucose-Capillary: 156 mg/dL — ABNORMAL HIGH (ref 65–99)

## 2015-11-18 SURGERY — COLONOSCOPY WITH PROPOFOL
Anesthesia: General

## 2015-11-18 MED ORDER — EPHEDRINE SULFATE 50 MG/ML IJ SOLN
INTRAMUSCULAR | Status: DC | PRN
  Administered 2015-11-18: 5 mg via INTRAVENOUS
  Administered 2015-11-18 (×2): 10 mg via INTRAVENOUS
  Administered 2015-11-18: 5 mg via INTRAVENOUS
  Administered 2015-11-18 (×2): 10 mg via INTRAVENOUS

## 2015-11-18 MED ORDER — FENTANYL CITRATE (PF) 100 MCG/2ML IJ SOLN
INTRAMUSCULAR | Status: DC | PRN
  Administered 2015-11-18: 50 ug via INTRAVENOUS

## 2015-11-18 MED ORDER — SODIUM CHLORIDE 0.9 % IV SOLN
INTRAVENOUS | Status: DC | PRN
  Administered 2015-11-18: 08:00:00 via INTRAVENOUS

## 2015-11-18 MED ORDER — SODIUM CHLORIDE 0.9 % IV SOLN
INTRAVENOUS | Status: DC
  Administered 2015-11-18: 08:00:00 via INTRAVENOUS

## 2015-11-18 MED ORDER — PROPOFOL 500 MG/50ML IV EMUL
INTRAVENOUS | Status: DC | PRN
  Administered 2015-11-18: 100 ug/kg/min via INTRAVENOUS

## 2015-11-18 MED ORDER — PROPOFOL 10 MG/ML IV BOLUS
INTRAVENOUS | Status: DC | PRN
  Administered 2015-11-18: 50 mg via INTRAVENOUS

## 2015-11-18 MED ORDER — MIDAZOLAM HCL 5 MG/5ML IJ SOLN
INTRAMUSCULAR | Status: DC | PRN
  Administered 2015-11-18 (×2): 1 mg via INTRAVENOUS

## 2015-11-18 MED ORDER — FENTANYL CITRATE (PF) 100 MCG/2ML IJ SOLN: 25.0000 ug | INTRAMUSCULAR | Status: DC | PRN

## 2015-11-18 MED ORDER — LIDOCAINE HCL (PF) 2 % IJ SOLN
INTRAMUSCULAR | Status: DC | PRN
  Administered 2015-11-18: 50 mg

## 2015-11-18 MED ORDER — SODIUM CHLORIDE 0.9 % IV SOLN: INTRAVENOUS | Status: DC

## 2015-11-18 MED ORDER — ONDANSETRON HCL 4 MG/2ML IJ SOLN: 4.0000 mg | Freq: Once | INTRAMUSCULAR | Status: DC | PRN

## 2015-11-18 NOTE — H&P (Signed)
Primary Care Physician:  [name], MD Primary Gastroenterologist:  Dr. Vira Agar  Pre-Procedure History & Physical: HPI:  [name]. is a 60 y.o. male is here for an colonoscopy.   Past Medical History  Diagnosis Date  . Asthma   . Chronic kidney disease   . Coronary artery disease   . Depression   . Diabetes mellitus without complication ([place])   . Hypertension   . Gout   . History anabolic steroid use   . Sleep apnea   . Hypogonadism in male   . Morbid obesity ([place])   . Myocardial infarction ([place])   . Hyperlipidemia   . Perirectal abscess   . Pleurisy   . Varicella     Past Surgical History  Procedure Laterality Date  . Coronary artery bypass graft    . Tonsillectomy    . Toe amputation    . Abdominal aortic aneurysm repair    . Knee arthroscopy      Prior to Admission medications   Medication Sig Start Date End Date Taking? Authorizing Provider  albuterol (PROVENTIL) (2.5 MG/3ML) 0.083% nebulizer solution Take 2.5 mg by nebulization every 6 (six) hours as needed for wheezing or shortness of breath.   Yes Historical Provider, MD  albuterol (VENTOLIN HFA) 108 (90 Base) MCG/ACT inhaler Inhale into the lungs every 6 (six) hours as needed for wheezing or shortness of breath.   Yes Historical Provider, MD  albuterol-ipratropium (COMBIVENT) 18-103 MCG/ACT inhaler Inhale into the lungs every 4 (four) hours.   Yes Historical Provider, MD  atorvastatin (LIPITOR) 20 MG tablet Take 20 mg by mouth daily.   Yes Historical Provider, MD  clopidogrel (PLAVIX) 75 MG tablet Take 75 mg by mouth daily.   Yes Historical Provider, MD  cyanocobalamin 2000 MCG tablet Take 2,000 mcg by mouth daily.   Yes Historical Provider, MD  furosemide (LASIX) 20 MG tablet Take 20 mg by mouth.   Yes Historical Provider, MD  isosorbide mononitrate (IMDUR) 30 MG 24 hr tablet Take 30 mg by mouth daily.   Yes Historical Provider, MD  losartan (COZAAR) 100 MG tablet Take 100 mg by mouth  daily.   Yes Historical Provider, MD  metformin (FORTAMET) 1000 MG (OSM) 24 hr tablet Take 1,000 mg by mouth daily with breakfast.   Yes Historical Provider, MD  metoprolol (LOPRESSOR) 50 MG tablet Take 50 mg by mouth 2 (two) times daily.   Yes Historical Provider, MD    Allergies as of 10/24/2015  . (Not on File)    History reviewed. No pertinent family history.  Social History   Social History  . Marital Status: Widowed    Spouse Name: [name]  . Number of Children: [name]  . Years of Education: [name]   Occupational History  . Not on file.   Social History Main Topics  . Smoking status: Current Every Day Smoker  . Smokeless tobacco: Not on file  . Alcohol Use: Yes  . Drug Use: Not on file  . Sexual Activity: Not on file   Other Topics Concern  . Not on file   Social History Narrative    Review of Systems: See HPI, otherwise negative ROS  Physical Exam: BP 134/74 mmHg  Pulse 75  Temp(Src) 96.5 F (35.8 C) (Tympanic)  Resp 20  Ht 6\' 4"  (1.93 m)  Wt 185.975 kg (410 lb)  BMI 49.93 kg/m2  SpO2 93% General:   Alert,  pleasant and cooperative in NAD, very obese, weight 400lbs  Head:  Normocephalic and atraumatic. Neck:  Supple; no masses or thyromegaly. Lungs:  Clear throughout to auscultation.    Heart:  Regular rate and rhythm. Abdomen:  Soft, nontender and nondistended. Normal bowel sounds, without guarding, and without rebound.  Large, no palpable masses. Neurologic:  Alert and  oriented x4;  grossly normal neurologically.  Impression/Plan: [name]. is here for an colonoscopy to be performed for screening due to [place] colon cancer in his mother.  Risks, benefits, limitations, and alternatives regarding  colonoscopy have been reviewed with the patient.  Questions have been answered.  All parties agreeable.   Gaylyn Cheers, MD  11/18/2015, 7:31 AM

## 2015-11-18 NOTE — Transfer of Care (Signed)
Immediate Anesthesia Transfer of Care Note  Patient: [name].  Procedure(s) Performed: Procedure(s): COLONOSCOPY WITH PROPOFOL ([name])  Patient Location: PACU  Anesthesia Type:General  Level of Consciousness: awake  Airway & Oxygen Therapy: Patient Spontanous Breathing and Patient connected to nasal cannula oxygen  Post-op Assessment: Report given to RN and Post -op Vital signs reviewed and stable  Post vital signs: Reviewed and stable  Last Vitals:  Filed Vitals:   11/18/15 0705  BP: 134/74  Pulse: 75  Temp: 35.8 C  Resp: 20    Last Pain: There were no vitals filed for this visit.       Complications: No apparent anesthesia complications

## 2015-11-18 NOTE — Anesthesia Postprocedure Evaluation (Signed)
Anesthesia Post Note  Patient: [name].  Procedure(s) Performed: Procedure(s) (LRB): COLONOSCOPY WITH PROPOFOL ([name])  Patient location during evaluation: Endoscopy Anesthesia Type: General Level of consciousness: awake and alert Pain management: pain level controlled Vital Signs Assessment: post-procedure vital signs reviewed and stable Respiratory status: spontaneous breathing, nonlabored ventilation, respiratory function stable and patient connected to nasal cannula oxygen Cardiovascular status: blood pressure returned to baseline and stable Postop Assessment: no signs of nausea or vomiting Anesthetic complications: no    Last Vitals:  Filed Vitals:   11/18/15 0830 11/18/15 0840  BP: 108/66 99/66  Pulse: 79 77  Temp:    Resp: 18 22    Last Pain: There were no vitals filed for this visit.               Jashaun Penrose S

## 2015-11-18 NOTE — Anesthesia Preprocedure Evaluation (Addendum)
Anesthesia Evaluation  Patient identified by MRN, date of birth, ID band Patient awake    Reviewed: Allergy & Precautions, NPO status , Patient's Chart, lab work & pertinent test results, reviewed documented beta blocker date and time   Airway Mallampati: II  TM Distance: >3 FB     Dental  (+) Chipped, Dental Advisory Given, Poor Dentition, Missing   Pulmonary asthma , sleep apnea and Continuous Positive Airway Pressure Ventilation , COPD,  COPD inhaler, Current Smoker,           Cardiovascular hypertension, Pt. on medications and Pt. on home beta blockers + CAD, + Past MI and + CABG       Neuro/Psych PSYCHIATRIC DISORDERS Depression    GI/Hepatic   Endo/Other  diabetes, Type 2Morbid obesity  Renal/GU Renal disease     Musculoskeletal   Abdominal   Peds  Hematology   Anesthesia Other Findings MI 14 yrs ago with CABG 10 yrs. No cardiac stents but has lower extr stents.  Reproductive/Obstetrics                            Anesthesia Physical Anesthesia Plan  ASA: III  Anesthesia Plan: General   Post-op Pain Management:    Induction: Intravenous  Airway Management Planned: Nasal Cannula  Additional Equipment:   Intra-op Plan:   Post-operative Plan:   Informed Consent: I have reviewed the patients History and Physical, chart, labs and discussed the procedure including the risks, benefits and alternatives for the proposed anesthesia with the patient or authorized representative who has indicated his/her understanding and acceptance.     Plan Discussed with: CRNA  Anesthesia Plan Comments:         Anesthesia Quick Evaluation

## 2015-11-21 ENCOUNTER — Encounter: Payer: Self-pay | Admitting: Unknown Physician Specialty

## 2015-11-21 LAB — SURGICAL PATHOLOGY

## 2015-12-29 DIAGNOSIS — S90821A Blister (nonthermal), right foot, initial encounter: Secondary | ICD-10-CM | POA: Diagnosis not present

## 2015-12-29 DIAGNOSIS — E1142 Type 2 diabetes mellitus with diabetic polyneuropathy: Secondary | ICD-10-CM | POA: Diagnosis not present

## 2015-12-29 DIAGNOSIS — L089 Local infection of the skin and subcutaneous tissue, unspecified: Secondary | ICD-10-CM | POA: Diagnosis not present

## 2016-01-11 DIAGNOSIS — E7800 Pure hypercholesterolemia, unspecified: Secondary | ICD-10-CM | POA: Diagnosis not present

## 2016-01-11 DIAGNOSIS — Z794 Long term (current) use of insulin: Secondary | ICD-10-CM | POA: Diagnosis not present

## 2016-01-11 DIAGNOSIS — R21 Rash and other nonspecific skin eruption: Secondary | ICD-10-CM | POA: Diagnosis not present

## 2016-01-11 DIAGNOSIS — Z23 Encounter for immunization: Secondary | ICD-10-CM | POA: Diagnosis not present

## 2016-01-11 DIAGNOSIS — I10 Essential (primary) hypertension: Secondary | ICD-10-CM | POA: Diagnosis not present

## 2016-01-11 DIAGNOSIS — E118 Type 2 diabetes mellitus with unspecified complications: Secondary | ICD-10-CM | POA: Diagnosis not present

## 2016-01-12 DIAGNOSIS — L97512 Non-pressure chronic ulcer of other part of right foot with fat layer exposed: Secondary | ICD-10-CM | POA: Diagnosis not present

## 2016-01-13 DIAGNOSIS — L97519 Non-pressure chronic ulcer of other part of right foot with unspecified severity: Secondary | ICD-10-CM | POA: Diagnosis not present

## 2016-01-17 DIAGNOSIS — E669 Obesity, unspecified: Secondary | ICD-10-CM | POA: Diagnosis not present

## 2016-01-17 DIAGNOSIS — M79609 Pain in unspecified limb: Secondary | ICD-10-CM | POA: Diagnosis not present

## 2016-01-17 DIAGNOSIS — I10 Essential (primary) hypertension: Secondary | ICD-10-CM | POA: Diagnosis not present

## 2016-01-17 DIAGNOSIS — I70219 Atherosclerosis of native arteries of extremities with intermittent claudication, unspecified extremity: Secondary | ICD-10-CM | POA: Diagnosis not present

## 2016-01-17 DIAGNOSIS — I739 Peripheral vascular disease, unspecified: Secondary | ICD-10-CM | POA: Diagnosis not present

## 2016-01-17 DIAGNOSIS — I724 Aneurysm of artery of lower extremity: Secondary | ICD-10-CM | POA: Diagnosis not present

## 2016-01-17 DIAGNOSIS — I714 Abdominal aortic aneurysm, without rupture: Secondary | ICD-10-CM | POA: Diagnosis not present

## 2016-01-17 DIAGNOSIS — E119 Type 2 diabetes mellitus without complications: Secondary | ICD-10-CM | POA: Diagnosis not present

## 2016-01-18 ENCOUNTER — Other Ambulatory Visit: Payer: Self-pay | Admitting: Vascular Surgery

## 2016-01-23 ENCOUNTER — Other Ambulatory Visit
Admission: RE | Admit: 2016-01-23 | Discharge: 2016-01-23 | Disposition: A | Discharge status: Home / Self Care | Payer: PPO | Source: Intra-hospital | Attending: Vascular Surgery | Admitting: Vascular Surgery

## 2016-01-23 DIAGNOSIS — F17200 Nicotine dependence, unspecified, uncomplicated: Secondary | ICD-10-CM | POA: Insufficient documentation

## 2016-01-23 DIAGNOSIS — I714 Abdominal aortic aneurysm, without rupture: Secondary | ICD-10-CM | POA: Diagnosis not present

## 2016-01-23 DIAGNOSIS — I739 Peripheral vascular disease, unspecified: Secondary | ICD-10-CM | POA: Insufficient documentation

## 2016-01-23 DIAGNOSIS — I724 Aneurysm of artery of lower extremity: Secondary | ICD-10-CM | POA: Diagnosis not present

## 2016-01-23 DIAGNOSIS — Z79899 Other long term (current) drug therapy: Secondary | ICD-10-CM | POA: Diagnosis not present

## 2016-01-23 DIAGNOSIS — Z794 Long term (current) use of insulin: Secondary | ICD-10-CM | POA: Diagnosis not present

## 2016-01-23 DIAGNOSIS — Z01812 Encounter for preprocedural laboratory examination: Secondary | ICD-10-CM | POA: Insufficient documentation

## 2016-01-23 DIAGNOSIS — E119 Type 2 diabetes mellitus without complications: Secondary | ICD-10-CM | POA: Insufficient documentation

## 2016-01-23 DIAGNOSIS — I252 Old myocardial infarction: Secondary | ICD-10-CM | POA: Diagnosis not present

## 2016-01-23 DIAGNOSIS — Z8249 Family history of ischemic heart disease and other diseases of the circulatory system: Secondary | ICD-10-CM | POA: Insufficient documentation

## 2016-01-23 DIAGNOSIS — Z9889 Other specified postprocedural states: Secondary | ICD-10-CM | POA: Insufficient documentation

## 2016-01-23 DIAGNOSIS — J449 Chronic obstructive pulmonary disease, unspecified: Secondary | ICD-10-CM | POA: Insufficient documentation

## 2016-01-23 LAB — BUN: BUN: 20 mg/dL (ref 6–20)

## 2016-01-23 LAB — CREATININE, SERUM
Creatinine, Ser: 0.87 mg/dL (ref 0.61–1.24)
GFR calc non Af Amer: >60 mL/min (ref >60)

## 2016-01-24 ENCOUNTER — Encounter
Admission: AD | Disposition: A | Discharge status: Home / Self Care | Payer: Self-pay | Source: Ambulatory Visit | Attending: Vascular Surgery

## 2016-01-24 ENCOUNTER — Inpatient Hospital Stay
Admission: AD | Admit: 2016-01-24 | Discharge: 2016-01-26 | DRG: 271 | Disposition: A | Discharge status: Home / Self Care | Payer: PPO | Source: Ambulatory Visit | Attending: Vascular Surgery | Admitting: Vascular Surgery

## 2016-01-24 ENCOUNTER — Encounter: Payer: Self-pay | Admitting: *Deleted

## 2016-01-24 DIAGNOSIS — L97319 Non-pressure chronic ulcer of right ankle with unspecified severity: Secondary | ICD-10-CM | POA: Diagnosis present

## 2016-01-24 DIAGNOSIS — I70261 Atherosclerosis of native arteries of extremities with gangrene, right leg: Secondary | ICD-10-CM | POA: Diagnosis not present

## 2016-01-24 DIAGNOSIS — I998 Other disorder of circulatory system: Secondary | ICD-10-CM | POA: Diagnosis not present

## 2016-01-24 DIAGNOSIS — I70202 Unspecified atherosclerosis of native arteries of extremities, left leg: Secondary | ICD-10-CM | POA: Diagnosis present

## 2016-01-24 DIAGNOSIS — Z823 Family history of stroke: Secondary | ICD-10-CM | POA: Diagnosis not present

## 2016-01-24 DIAGNOSIS — Z8249 Family history of ischemic heart disease and other diseases of the circulatory system: Secondary | ICD-10-CM

## 2016-01-24 DIAGNOSIS — J45909 Unspecified asthma, uncomplicated: Secondary | ICD-10-CM | POA: Diagnosis present

## 2016-01-24 DIAGNOSIS — I252 Old myocardial infarction: Secondary | ICD-10-CM | POA: Diagnosis not present

## 2016-01-24 DIAGNOSIS — Z6841 Body Mass Index (BMI) 40.0 and over, adult: Secondary | ICD-10-CM

## 2016-01-24 DIAGNOSIS — E785 Hyperlipidemia, unspecified: Secondary | ICD-10-CM | POA: Diagnosis present

## 2016-01-24 DIAGNOSIS — L97511 Non-pressure chronic ulcer of other part of right foot limited to breakdown of skin: Secondary | ICD-10-CM | POA: Diagnosis present

## 2016-01-24 DIAGNOSIS — Z794 Long term (current) use of insulin: Secondary | ICD-10-CM | POA: Diagnosis not present

## 2016-01-24 DIAGNOSIS — I724 Aneurysm of artery of lower extremity: Secondary | ICD-10-CM | POA: Diagnosis present

## 2016-01-24 DIAGNOSIS — I7092 Chronic total occlusion of artery of the extremities: Secondary | ICD-10-CM | POA: Diagnosis not present

## 2016-01-24 DIAGNOSIS — E1122 Type 2 diabetes mellitus with diabetic chronic kidney disease: Secondary | ICD-10-CM | POA: Diagnosis not present

## 2016-01-24 DIAGNOSIS — I129 Hypertensive chronic kidney disease with stage 1 through stage 4 chronic kidney disease, or unspecified chronic kidney disease: Secondary | ICD-10-CM | POA: Diagnosis not present

## 2016-01-24 DIAGNOSIS — Z836 Family history of other diseases of the respiratory system: Secondary | ICD-10-CM

## 2016-01-24 DIAGNOSIS — N189 Chronic kidney disease, unspecified: Secondary | ICD-10-CM | POA: Diagnosis present

## 2016-01-24 DIAGNOSIS — I70238 Atherosclerosis of native arteries of right leg with ulceration of other part of lower right leg: Secondary | ICD-10-CM | POA: Diagnosis not present

## 2016-01-24 DIAGNOSIS — G4730 Sleep apnea, unspecified: Secondary | ICD-10-CM | POA: Diagnosis not present

## 2016-01-24 DIAGNOSIS — Z79899 Other long term (current) drug therapy: Secondary | ICD-10-CM | POA: Diagnosis not present

## 2016-01-24 DIAGNOSIS — I2510 Atherosclerotic heart disease of native coronary artery without angina pectoris: Secondary | ICD-10-CM | POA: Diagnosis present

## 2016-01-24 DIAGNOSIS — M79609 Pain in unspecified limb: Secondary | ICD-10-CM | POA: Diagnosis not present

## 2016-01-24 DIAGNOSIS — I739 Peripheral vascular disease, unspecified: Secondary | ICD-10-CM | POA: Diagnosis not present

## 2016-01-24 DIAGNOSIS — I70221 Atherosclerosis of native arteries of extremities with rest pain, right leg: Secondary | ICD-10-CM | POA: Diagnosis not present

## 2016-01-24 DIAGNOSIS — F17200 Nicotine dependence, unspecified, uncomplicated: Secondary | ICD-10-CM | POA: Diagnosis not present

## 2016-01-24 DIAGNOSIS — L8990 Pressure ulcer of unspecified site, unspecified stage: Secondary | ICD-10-CM | POA: Insufficient documentation

## 2016-01-24 DIAGNOSIS — E119 Type 2 diabetes mellitus without complications: Secondary | ICD-10-CM | POA: Diagnosis not present

## 2016-01-24 DIAGNOSIS — I714 Abdominal aortic aneurysm, without rupture: Secondary | ICD-10-CM | POA: Diagnosis not present

## 2016-01-24 DIAGNOSIS — I70219 Atherosclerosis of native arteries of extremities with intermittent claudication, unspecified extremity: Secondary | ICD-10-CM | POA: Diagnosis not present

## 2016-01-24 DIAGNOSIS — E11621 Type 2 diabetes mellitus with foot ulcer: Secondary | ICD-10-CM | POA: Diagnosis not present

## 2016-01-24 DIAGNOSIS — E669 Obesity, unspecified: Secondary | ICD-10-CM | POA: Diagnosis not present

## 2016-01-24 DIAGNOSIS — I10 Essential (primary) hypertension: Secondary | ICD-10-CM | POA: Diagnosis not present

## 2016-01-24 HISTORY — PX: PERIPHERAL VASCULAR CATHETERIZATION: SHX172C

## 2016-01-24 LAB — CBC
HCT: 49.9 % (ref 40.0–52.0)
HCT: 48.1 % (ref 40.0–52.0)
HEMOGLOBIN: 16.1 g/dL (ref 13.0–18.0)
Hemoglobin: 16.2 g/dL (ref 13.0–18.0)
MCH: 30 pg (ref 26.0–34.0)
MCH: 30.5 pg (ref 26.0–34.0)
MCHC: 32.5 g/dL (ref 32.0–36.0)
MCHC: 33.4 g/dL (ref 32.0–36.0)
MCV: 92.2 fL (ref 80.0–100.0)
MCV: 91.5 fL (ref 80.0–100.0)
PLATELETS: 159 10*3/uL (ref 150–440)
Platelets: 147 10*3/uL — ABNORMAL LOW (ref 150–440)
RBC: 5.42 MIL/uL (ref 4.40–5.90)
RBC: 5.26 MIL/uL (ref 4.40–5.90)
RDW: 15.1 % — AB (ref 11.5–14.5)
RDW: 15.2 % — AB (ref 11.5–14.5)
WBC: 11.8 10*3/uL — AB (ref 3.8–10.6)
WBC: 12.8 10*3/uL — ABNORMAL HIGH (ref 3.8–10.6)

## 2016-01-24 LAB — HEPARIN LEVEL (UNFRACTIONATED)

## 2016-01-24 LAB — FIBRINOGEN: Fibrinogen: 477 mg/dL — ABNORMAL HIGH (ref 210–475)

## 2016-01-24 LAB — GLUCOSE, CAPILLARY
GLUCOSE-CAPILLARY: 113 mg/dL — AB (ref 65–99)
GLUCOSE-CAPILLARY: 149 mg/dL — AB (ref 65–99)

## 2016-01-24 SURGERY — LOWER EXTREMITY ANGIOGRAPHY
Anesthesia: Moderate Sedation | Site: Leg Lower | Laterality: Right

## 2016-01-24 MED ORDER — MORPHINE SULFATE (PF) 4 MG/ML IV SOLN
2.0000 mg | INTRAVENOUS | Status: DC | PRN
  Administered 2016-01-25 (×2): 4 mg via INTRAVENOUS
  Filled 2016-01-24 (×2): qty 1

## 2016-01-24 MED ORDER — LABETALOL HCL 5 MG/ML IV SOLN: 10.0000 mg | INTRAVENOUS | Status: DC | PRN

## 2016-01-24 MED ORDER — MIDAZOLAM HCL 5 MG/5ML IJ SOLN
INTRAMUSCULAR | Status: AC
  Filled 2016-01-24: qty 5

## 2016-01-24 MED ORDER — ALBUTEROL SULFATE (2.5 MG/3ML) 0.083% IN NEBU: 2.5000 mg | INHALATION_SOLUTION | Freq: Four times a day (QID) | RESPIRATORY_TRACT | Status: DC | PRN

## 2016-01-24 MED ORDER — DIPHENHYDRAMINE HCL 50 MG/ML IJ SOLN
INTRAMUSCULAR | Status: AC
  Filled 2016-01-24: qty 1

## 2016-01-24 MED ORDER — SODIUM CHLORIDE 0.9 % IV SOLN
0.5000 mg/h | INTRAVENOUS | Status: DC
  Administered 2016-01-24 – 2016-01-25 (×2): 0.5 mg/h
  Filled 2016-01-24 (×4): qty 10

## 2016-01-24 MED ORDER — ACETAMINOPHEN 325 MG PO TABS
325.0000 mg | ORAL_TABLET | ORAL | Status: DC | PRN
  Administered 2016-01-26: 650 mg via ORAL
  Filled 2016-01-24: qty 2

## 2016-01-24 MED ORDER — ALTEPLASE 2 MG IJ SOLR
INTRAMUSCULAR | Status: AC
  Filled 2016-01-24: qty 12

## 2016-01-24 MED ORDER — FENTANYL CITRATE (PF) 100 MCG/2ML IJ SOLN
INTRAMUSCULAR | Status: AC
  Filled 2016-01-24: qty 2

## 2016-01-24 MED ORDER — ONDANSETRON HCL 4 MG/2ML IJ SOLN: 4.0000 mg | Freq: Four times a day (QID) | INTRAMUSCULAR | Status: DC | PRN

## 2016-01-24 MED ORDER — CLOPIDOGREL BISULFATE 75 MG PO TABS
75.0000 mg | ORAL_TABLET | Freq: Every day | ORAL | Status: DC
  Administered 2016-01-24 – 2016-01-25 (×2): 75 mg via ORAL
  Filled 2016-01-24 (×2): qty 1

## 2016-01-24 MED ORDER — ACETAMINOPHEN 325 MG RE SUPP
325.0000 mg | RECTAL | Status: DC | PRN
  Filled 2016-01-24: qty 2

## 2016-01-24 MED ORDER — VITAMIN B-12 1000 MCG PO TABS: 2000.0000 ug | ORAL_TABLET | Freq: Every day | ORAL | Status: DC

## 2016-01-24 MED ORDER — ALUM & MAG HYDROXIDE-SIMETH 200-200-20 MG/5ML PO SUSP: 15.0000 mL | ORAL | Status: DC | PRN

## 2016-01-24 MED ORDER — METHYLPREDNISOLONE SODIUM SUCC 125 MG IJ SOLR: 125.0000 mg | INTRAMUSCULAR | Status: DC | PRN

## 2016-01-24 MED ORDER — LOSARTAN POTASSIUM 25 MG PO TABS: 100.0000 mg | ORAL_TABLET | Freq: Every day | ORAL | Status: DC

## 2016-01-24 MED ORDER — FENTANYL CITRATE (PF) 100 MCG/2ML IJ SOLN
INTRAMUSCULAR | Status: DC | PRN
  Administered 2016-01-24 (×5): 50 ug via INTRAVENOUS
  Administered 2016-01-24: 100 ug via INTRAVENOUS
  Administered 2016-01-24: 50 ug via INTRAVENOUS

## 2016-01-24 MED ORDER — HYDROMORPHONE HCL 1 MG/ML IJ SOLN
1.0000 mg | Freq: Once | INTRAMUSCULAR | Status: DC
  Administered 2016-01-25 (×2): 1 mg via INTRAVENOUS

## 2016-01-24 MED ORDER — ISOSORBIDE MONONITRATE ER 30 MG PO TB24: 30.0000 mg | ORAL_TABLET | Freq: Every day | ORAL | Status: DC

## 2016-01-24 MED ORDER — FAMOTIDINE 20 MG PO TABS: 40.0000 mg | ORAL_TABLET | ORAL | Status: DC | PRN

## 2016-01-24 MED ORDER — SODIUM CHLORIDE 0.9 % IV SOLN
INTRAVENOUS | Status: DC
  Administered 2016-01-24: 13:00:00 via INTRAVENOUS

## 2016-01-24 MED ORDER — HEPARIN SODIUM (PORCINE) 1000 UNIT/ML IJ SOLN
INTRAMUSCULAR | Status: DC | PRN
Start: 2016-01-24 — End: 2016-01-24
  Administered 2016-01-24: 4000 [IU] via INTRAVENOUS

## 2016-01-24 MED ORDER — OXYCODONE HCL 5 MG PO TABS
5.0000 mg | ORAL_TABLET | ORAL | Status: DC | PRN
  Administered 2016-01-24 – 2016-01-25 (×2): 5 mg via ORAL
  Filled 2016-01-24 (×2): qty 1

## 2016-01-24 MED ORDER — IOPAMIDOL (ISOVUE-300) INJECTION 61%
INTRAVENOUS | Status: DC | PRN
  Administered 2016-01-24: 60 mL via INTRA_ARTERIAL

## 2016-01-24 MED ORDER — SODIUM CHLORIDE 0.9 % IV SOLN
INTRAVENOUS | Status: DC
  Administered 2016-01-24 – 2016-01-26 (×3): via INTRAVENOUS

## 2016-01-24 MED ORDER — ALTEPLASE 2 MG IJ SOLR
INTRAMUSCULAR | Status: DC | PRN
Start: 2016-01-24 — End: 2016-01-24
  Administered 2016-01-24: 12 mg

## 2016-01-24 MED ORDER — HEPARIN SODIUM (PORCINE) 1000 UNIT/ML IJ SOLN
INTRAMUSCULAR | Status: AC
  Filled 2016-01-24: qty 1

## 2016-01-24 MED ORDER — DEXTROSE 5 % IV SOLN
1.5000 g | INTRAVENOUS | Status: AC
  Administered 2016-01-24 – 2016-01-25 (×2): 1.5 g via INTRAVENOUS

## 2016-01-24 MED ORDER — ATORVASTATIN CALCIUM 20 MG PO TABS
20.0000 mg | ORAL_TABLET | Freq: Every day | ORAL | Status: DC
  Administered 2016-01-25: 20 mg via ORAL
  Filled 2016-01-24: qty 1

## 2016-01-24 MED ORDER — METOPROLOL TARTRATE 50 MG PO TABS
50.0000 mg | ORAL_TABLET | Freq: Two times a day (BID) | ORAL | Status: DC
  Administered 2016-01-24 – 2016-01-25 (×2): 50 mg via ORAL
  Filled 2016-01-24 (×3): qty 1

## 2016-01-24 MED ORDER — HEPARIN (PORCINE) IN NACL 100-0.45 UNIT/ML-% IJ SOLN
400.0000 [IU]/h | INTRAMUSCULAR | Status: DC
  Administered 2016-01-24: 400 [IU]/h via INTRAVENOUS
  Filled 2016-01-24: qty 250

## 2016-01-24 MED ORDER — HYDRALAZINE HCL 20 MG/ML IJ SOLN: 5.0000 mg | INTRAMUSCULAR | Status: DC | PRN

## 2016-01-24 MED ORDER — HEPARIN (PORCINE) IN NACL 2-0.9 UNIT/ML-% IJ SOLN
INTRAMUSCULAR | Status: AC
  Filled 2016-01-24: qty 1500

## 2016-01-24 MED ORDER — TAMSULOSIN HCL 0.4 MG PO CAPS: 0.4000 mg | ORAL_CAPSULE | Freq: Every day | ORAL | Status: DC

## 2016-01-24 MED ORDER — METOPROLOL TARTRATE 5 MG/5ML IV SOLN
5.0000 mg | Freq: Four times a day (QID) | INTRAVENOUS | Status: DC
  Administered 2016-01-25 – 2016-01-26 (×4): 5 mg via INTRAVENOUS
  Filled 2016-01-24 (×4): qty 5

## 2016-01-24 MED ORDER — IPRATROPIUM-ALBUTEROL 0.5-2.5 (3) MG/3ML IN SOLN
3.0000 mL | Freq: Four times a day (QID) | RESPIRATORY_TRACT | Status: DC
  Administered 2016-01-24 – 2016-01-26 (×5): 3 mL via RESPIRATORY_TRACT
  Filled 2016-01-24 (×5): qty 3

## 2016-01-24 MED ORDER — PANTOPRAZOLE SODIUM 40 MG PO TBEC
40.0000 mg | DELAYED_RELEASE_TABLET | Freq: Every day | ORAL | Status: DC
  Administered 2016-01-24: 40 mg via ORAL
  Filled 2016-01-24: qty 1

## 2016-01-24 MED ORDER — HEPARIN (PORCINE) IN NACL 100-0.45 UNIT/ML-% IJ SOLN
INTRAMUSCULAR | Status: AC
  Filled 2016-01-24: qty 250

## 2016-01-24 MED ORDER — INSULIN ASPART 100 UNIT/ML ~~LOC~~ SOLN
0.0000 [IU] | Freq: Three times a day (TID) | SUBCUTANEOUS | Status: DC
  Administered 2016-01-25 (×2): 2 [IU] via SUBCUTANEOUS
  Administered 2016-01-25: 8 [IU] via SUBCUTANEOUS
  Administered 2016-01-25: 2 [IU] via SUBCUTANEOUS
  Administered 2016-01-26: 3 [IU] via SUBCUTANEOUS
  Filled 2016-01-24: qty 8
  Filled 2016-01-24 (×2): qty 2
  Filled 2016-01-24: qty 3
  Filled 2016-01-24: qty 2

## 2016-01-24 MED ORDER — MIDAZOLAM HCL 2 MG/2ML IJ SOLN
INTRAMUSCULAR | Status: DC | PRN
  Administered 2016-01-24 (×2): 1 mg via INTRAVENOUS
  Administered 2016-01-24 (×3): 2 mg via INTRAVENOUS

## 2016-01-24 MED ORDER — DOCUSATE SODIUM 100 MG PO CAPS
100.0000 mg | ORAL_CAPSULE | Freq: Every day | ORAL | Status: DC
  Administered 2016-01-25: 100 mg via ORAL
  Filled 2016-01-24: qty 1

## 2016-01-24 SURGICAL SUPPLY — 28 items
BALLN DORADO 6X40X80 (BALLOONS) ×3
BALLN DORADO 8X100X80 (BALLOONS) ×3
BALLOON DORADO 6X40X80 (BALLOONS) ×1 IMPLANT
BALLOON DORADO 8X100X80 (BALLOONS) ×1 IMPLANT
CANNULA 5F STIFF (CANNULA) ×3 IMPLANT
CATH INFUS 90CMX20CM (CATHETERS) ×3 IMPLANT
CATH KA2 5FR 65CM (CATHETERS) ×3 IMPLANT
CATH MACH1 6X55 (CATHETERS) ×3 IMPLANT
CATH PIG 70CM (CATHETERS) ×3 IMPLANT
DEVICE SOLENT PROXI 90CM (CATHETERS) ×3 IMPLANT
DRAPE INCISE IOBAN 66X45 STRL (DRAPES) ×6 IMPLANT
GLIDECATH 4FR STR (CATHETERS) ×3 IMPLANT
GLIDECATH F4/38/100ST (CATHETERS) ×3 IMPLANT
GLIDEWIRE ADV .035X180CM (WIRE) ×3 IMPLANT
GUIDEWIRE SUPER STIFF .035X180 (WIRE) ×3 IMPLANT
GUIDEWIRE ZIPWIRE .035X180CM (WIRE) ×3 IMPLANT
KIT CATH CVC 3 LUMEN 7FR 8IN (MISCELLANEOUS) ×3 IMPLANT
NEEDLE ENTRY 21GA 7CM ECHOTIP (NEEDLE) ×3 IMPLANT
PACK ANGIOGRAPHY (CUSTOM PROCEDURE TRAY) ×3 IMPLANT
SET INTRO CAPELLA COAXIAL (SET/KITS/TRAYS/PACK) ×3 IMPLANT
SHEATH BRITE TIP 5FRX11 (SHEATH) ×3 IMPLANT
SHEATH BRITE TIP 6FR X 23 (SHEATH) ×3 IMPLANT
SUT SILK 0 FSL (SUTURE) ×3 IMPLANT
SYR MEDRAD MARK V 150ML (SYRINGE) ×3 IMPLANT
TUBING CONTRAST HIGH PRESS 72 (TUBING) ×3 IMPLANT
VALVE COPILOT STAT (MISCELLANEOUS) ×3 IMPLANT
WIRE J 3MM .035X145CM (WIRE) ×3 IMPLANT
WIRE MAGIC TORQUE 260C (WIRE) ×3 IMPLANT

## 2016-01-24 NOTE — Op Note (Signed)
OPERATIVE NOTE   PROCEDURE: 1. Insertion of triple-lumen central venous catheter right femoral approach.  PRE-OPERATIVE DIAGNOSIS: Ischemic right leg  POST-OPERATIVE DIAGNOSIS: Same  SURGEON: Katha Cabal M.D.  ANESTHESIA: 1% lidocaine local infiltration  ESTIMATED BLOOD LOSS: Minimal cc  INDICATIONS:   Alec Less. is a 60 y.o. male who presents with ischemic right leg an occluded popliteal stents. He did not open up with mechanical thrombectomy and pulse TPA and therefore is undergoing thrombolysis overnight and requires appropriate intravenous access..  DESCRIPTION: After obtaining full informed written consent, the patient was positioned supine. The right groin was prepped and draped in a sterile fashion. Ultrasound was placed in a sterile sleeve. Ultrasound was utilized to identify the right femoral vein which is noted to be echolucent and compressible indicating patency. Images recorded for the permanent record. Under real-time visualization a Seldinger needle is inserted into the vein and the guidewires advanced without difficulty. Small counterincision was made at the wire insertion site. Dilators passed over the wire and the triple-lumen catheter is fed without difficulty.  All 3 lm aspirate and flush easily and are packed with heparin saline. Catheter secured to the skin of the right groin with 2-0 silk. A sterile dressing is applied with Biopatch.  COMPLICATIONS: None  CONDITION: Unchanged  Katha Cabal, M.D. [facility]. Office:  [phone]   01/24/2016, 6:29 PM

## 2016-01-24 NOTE — H&P (Signed)
[facility] History & Physical Update  The patient was interviewed and re-examined.  The patient's previous History and Physical has been reviewed and is unchanged.  There is no change in the plan of care. We plan to proceed with the scheduled procedure.  Hortencia Pilar, MD  01/24/2016, 2:10 PM

## 2016-01-24 NOTE — Progress Notes (Addendum)
ANTICOAGULATION CONSULT NOTE - Initial Consult  Pharmacy Consult for Heparin Indication: DVT  No Known Allergies  Patient Measurements: Height: 6\' 2"  (188 cm) Weight: (!) 400 lb (181.4 kg) IBW/kg (Calculated) : 82.2 Heparin Dosing Weight:  126.4 kg   Vital Signs: Temp: 98 F (36.7 C) (09/26 1251) Temp Source: Oral (09/26 1251) BP: 151/89 (09/26 1815) Pulse Rate: 61 (09/26 1815)  Labs:  Recent Labs  01/23/16 1331  CREATININE 0.87    Estimated Creatinine Clearance: 155.7 mL/min (by C-G formula based on SCr of 0.87 mg/dL).   Medical History: Past Medical History:  Diagnosis Date  . Asthma   . Chronic kidney disease   . Coronary artery disease   . Depression   . Diabetes mellitus without complication ([place])   . Gout   . History anabolic steroid use   . Hyperlipidemia   . Hypertension   . Hypogonadism in male   . Morbid obesity ([place])   . Myocardial infarction ([place])   . Perirectal abscess   . Pleurisy   . Sleep apnea   . Varicella     Medications:  Scheduled:  . atorvastatin  20 mg Oral Daily  . clopidogrel  75 mg Oral Daily  . [START ON 01/25/2016] docusate sodium  100 mg Oral Daily  . heparin      . [START ON 01/25/2016] insulin aspart  0-15 Units Subcutaneous TID WC  . ipratropium-albuterol  3 mL Inhalation Q6H  . [START ON 01/25/2016] isosorbide mononitrate  30 mg Oral Daily  . [START ON 01/25/2016] losartan  100 mg Oral Daily  . metoprolol  5 mg Intravenous Q6H  . metoprolol  50 mg Oral BID  . pantoprazole  40 mg Oral Daily  . tamsulosin  0.4 mg Oral Daily  . cyanocobalamin  2,000 mcg Oral Daily    Assessment: Pharmacy consulted to dose heparin in this 60 year old male undergoing thrombolysis.   Goal of Therapy:  aPTT < 50 seconds   Plan:  Start heparin infusion at 400 units/hr  Will draw HL and aPTT 6 hrs X 4 .   Will decrease Heparin gtt by 100 units/hr if PTT > 50.    Aundrea Higginbotham D 01/24/2016,7:04 PM

## 2016-01-24 NOTE — Op Note (Signed)
[name] Percutaneous Study/Intervention Procedural Note   Date of Surgery: 01/24/2016  Surgeon:  Katha Cabal, MD.  Pre-operative Diagnosis: Ischemic leg; atherosclerotic occlusive disease with rest pain; popliteal artery aneurysm status post repair  Post-operative diagnosis: Same  Procedure(s) Performed: 1. Introduction catheter into right lower extremity 2rd order catheter placement  2. Contrast injection right lower extremity for distal runoff   3. Infusion of TPA for thrombolysis right lower extremity; subsequent continued thrombolysis 4.  Angiojet mechanical thrombectomy with the DBX device right distal SFA and popliteal             5.   Percutaneous transluminal angioplasty right superficial femoral artery and popliteal     Anesthesia: Conscious sedation was administered under my direct supervision. IV Versed plus fentanyl were utilized. Continuous ECG, pulse oximetry and blood pressure was monitored throughout the entire procedure. Conscious sedation was for a total of 100 minutes.  Sheath: 6 French 23 cm Pinnacle sheath antegrade right SFA  Contrast: 60 cc  Fluoroscopy Time: 14.3 minutes  Indications: Alec A Lacerte Jr. presents with rest pain and gangrenous changes to his right foot. He has a history of popliteal artery aneurysm repair approximately 2 years ago using via Bond stents. The risks and benefits are reviewed all questions answered patient agrees to proceed.  Procedure: Alec Less. is a 60 y.o. y.o. male who was identified and appropriate procedural time out was performed. The patient was then placed supine on the table and prepped and draped in the usual sterile fashion.   Ultrasound was placed in the sterile sleeve and the right distal groin was evaluated the right SFA was echolucent and pulsatile indicating patency.  Image was recorded for the permanent record  and under real-time visualization a microneedle was inserted into the superficial femoral artery microwire followed by a micro-sheath.  A J-wire was then advanced through the micro-sheath and a  6 French sheath was then inserted over a J-wire.  A KMP catheter was then advanced over a Glidewire and hand injection contrast was used to perform distal runoff representing second order catheter placement based on the position of the access.  Distal runoff was then performed.  Kumpe catheter and a Glidewire and subsequently an advantage wire with then negotiated through the occlusion and into the peroneal where hand injection contrast demonstrated intraluminal positioning.  4000 units of heparin was then given and allowed to circulate.  TPA was then reconstituted and a DBX AngioJet was used to lace the entire length of the occlusion with 12 milligrams of TPA in 100 cc. TPA was then allowed to dwell for 30 minutes. Follow-up imaging demonstrated incomplete thrombolysis and therefore the AngioJet catheter was used to achieve a more thorough thrombectomy. Multiple passes were made after which follow-up imaging was obtained which showed a stricture at the leading edge of the stent in the distal SFA. Very poor flow was noted in the below-knee trifurcation.   A 8 x 80 [place] balloon was used to angioplasty the SFA and popliteal arteries. Inflations were to 16 atmospheres for 1 minute. Distal runoff was then reassessed.  Minimal improvement in flow was noted. The AngioJet was then reintroduced and several more passes were made but this did not improve the situation.  After review of these images the sheath is secured to skin with suture. An infusion catheter is then selected and opened onto the field advanced over the wire and positioned across the arterial occluded segment. The obturating wires  then placed in the catheter is flushed with heparinized saline. The sheath is also flushed with heparinized saline. The  infusion of TPA will begin once pharmacy is reconstituted the appropriate dose   Findings:   The SFA on the right is patent down to Hunter's canal where the artery occludes approximately 2 cm proximal to the previously placed Viabahn stents. Via extensive collaterals there is reconstitution of what appears to be the anterior tibial as well as the distal posterior tibial and peroneal from its proximal portion. Anterior tibial appears to be the best runoff.  Given these findings the catheter and wire are negotiated through the occlusion this is quite difficult and ultimately the peroneal is successfully cannulated as described above.  The occluded segment is then laced with TPA as described above however after an appropriate dwell time there is minimal flow noted and therefore mechanical thrombectomy is performed with the AngioJet. This does uncover several culprit lesions.  Following angioplasty the SFA and popliteal show minimal improvement the AngioJet mechanical thrombectomy is repeated.  However follow-up imaging does not demonstrate significant improvement again and therefore it is decided to perform an overnight infusion of TPA.   Disposition: Patient was taken to the recovery room in stable condition having tolerated the procedure well. He will be transferred to the intensive care unit when a bed becomes available  [name] 01/24/2016,6:18 PM

## 2016-01-24 NOTE — Progress Notes (Signed)
[facility] Progress Note Patient Name: [name]. DOB: December 17, 1955 MRN: [id number]   Date of Service  01/24/2016  HPI/Events of Note  Ischemic leg; atherosclerotic occlusive disease with rest pain; popliteal artery aneurysm status post repair - Infusion of TPA for thrombolysis right lower extremity; subsequent continued thrombolysis 4.  Angiojet mechanical thrombectomy with the DBX device right distal SFA and popliteal             5.   Percutaneous transluminal angioplasty right superficial femoral artery and popliteal  Patient has no current complaints. Pedal pulses dopplered bilaterally.  eICU Interventions  Heparin Pulse checks Follow crt after contrast Camera check reveals NO distress      Intervention Category Evaluation Type: New Patient Evaluation  Raylene Miyamoto. 01/24/2016, 7:49 PM

## 2016-01-24 NOTE — Progress Notes (Signed)
Patient arrived on floor with specials RN. Patient has no current complaints. Pedal pulses dopplered bilaterally. VSS. R groin has no signs of bleeding. Patient lying flat and educated not to bend R leg.  Wilnette Kales

## 2016-01-25 ENCOUNTER — Encounter
Admission: AD | Disposition: A | Discharge status: Home / Self Care | Payer: Self-pay | Source: Ambulatory Visit | Attending: Vascular Surgery

## 2016-01-25 ENCOUNTER — Encounter: Payer: Self-pay | Admitting: Vascular Surgery

## 2016-01-25 DIAGNOSIS — L8990 Pressure ulcer of unspecified site, unspecified stage: Secondary | ICD-10-CM | POA: Insufficient documentation

## 2016-01-25 HISTORY — PX: PERIPHERAL VASCULAR CATHETERIZATION: SHX172C

## 2016-01-25 LAB — CBC
HCT: 43.6 % (ref 40.0–52.0)
HEMATOCRIT: 45.3 % (ref 40.0–52.0)
HEMOGLOBIN: 14.9 g/dL (ref 13.0–18.0)
Hemoglobin: 15.3 g/dL (ref 13.0–18.0)
MCH: 30.8 pg (ref 26.0–34.0)
MCH: 31 pg (ref 26.0–34.0)
MCHC: 33.8 g/dL (ref 32.0–36.0)
MCHC: 34.1 g/dL (ref 32.0–36.0)
MCV: 91.1 fL (ref 80.0–100.0)
MCV: 90.9 fL (ref 80.0–100.0)
PLATELETS: 125 10*3/uL — AB (ref 150–440)
Platelets: 144 10*3/uL — ABNORMAL LOW (ref 150–440)
RBC: 4.97 MIL/uL (ref 4.40–5.90)
RBC: 4.8 MIL/uL (ref 4.40–5.90)
RDW: 14.8 % — AB (ref 11.5–14.5)
RDW: 15.1 % — ABNORMAL HIGH (ref 11.5–14.5)
WBC: 9.2 10*3/uL (ref 3.8–10.6)
WBC: 9.3 10*3/uL (ref 3.8–10.6)

## 2016-01-25 LAB — GLUCOSE, CAPILLARY
GLUCOSE-CAPILLARY: 149 mg/dL — AB (ref 65–99)
Glucose-Capillary: 155 mg/dL — ABNORMAL HIGH (ref 65–99)
Glucose-Capillary: 147 mg/dL — ABNORMAL HIGH (ref 65–99)
Glucose-Capillary: 253 mg/dL — ABNORMAL HIGH (ref 65–99)

## 2016-01-25 LAB — FIBRINOGEN
Fibrinogen: 303 mg/dL (ref 210–475)
Fibrinogen: 361 mg/dL (ref 210–475)

## 2016-01-25 LAB — BASIC METABOLIC PANEL
ANION GAP: 4 — AB (ref 5–15)
BUN: 15 mg/dL (ref 6–20)
CHLORIDE: 104 mmol/L (ref 101–111)
CO2: 27 mmol/L (ref 22–32)
Calcium: 8.5 mg/dL — ABNORMAL LOW (ref 8.9–10.3)
Creatinine, Ser: 0.78 mg/dL (ref 0.61–1.24)
GFR calc Af Amer: >60 mL/min (ref >60)
GFR calc non Af Amer: >60 mL/min (ref >60)
GLUCOSE: 152 mg/dL — AB (ref 65–99)
POTASSIUM: 4.5 mmol/L (ref 3.5–5.1)
Sodium: 135 mmol/L (ref 135–145)

## 2016-01-25 LAB — MRSA PCR SCREENING: MRSA by PCR: NEGATIVE

## 2016-01-25 LAB — APTT: aPTT: 43 seconds — ABNORMAL HIGH (ref 24–36)

## 2016-01-25 LAB — HEPARIN LEVEL (UNFRACTIONATED): Heparin Unfractionated: <0.1 IU/mL — ABNORMAL LOW (ref 0.30–0.70)

## 2016-01-25 SURGERY — LOWER EXTREMITY ANGIOGRAPHY
Anesthesia: Moderate Sedation | Laterality: Right

## 2016-01-25 MED ORDER — FENTANYL CITRATE (PF) 100 MCG/2ML IJ SOLN
INTRAMUSCULAR | Status: DC | PRN
  Administered 2016-01-25 (×5): 50 ug via INTRAVENOUS

## 2016-01-25 MED ORDER — MIDAZOLAM HCL 5 MG/5ML IJ SOLN
INTRAMUSCULAR | Status: AC
  Filled 2016-01-25: qty 5

## 2016-01-25 MED ORDER — SODIUM CHLORIDE FLUSH 0.9 % IV SOLN
INTRAVENOUS | Status: AC
  Filled 2016-01-25: qty 10

## 2016-01-25 MED ORDER — HYDROMORPHONE HCL 1 MG/ML IJ SOLN
INTRAMUSCULAR | Status: AC
Start: 2016-01-25 — End: 2016-01-25
  Administered 2016-01-25: 1 mg via INTRAVENOUS
  Filled 2016-01-25: qty 1

## 2016-01-25 MED ORDER — SODIUM CHLORIDE 0.9 % IJ SOLN
INTRAMUSCULAR | Status: AC
  Filled 2016-01-25: qty 50

## 2016-01-25 MED ORDER — HEPARIN SODIUM (PORCINE) 1000 UNIT/ML IJ SOLN
INTRAMUSCULAR | Status: DC | PRN
  Administered 2016-01-25: 4000 [IU] via INTRAVENOUS
  Administered 2016-01-25: 2000 [IU] via INTRAVENOUS

## 2016-01-25 MED ORDER — TIROFIBAN (AGGRASTAT) BOLUS VIA INFUSION
3825.0000 ug | Freq: Once | INTRAVENOUS | Status: AC
  Administered 2016-01-25: 3825 ug via INTRAVENOUS
  Filled 2016-01-25: qty 77

## 2016-01-25 MED ORDER — TIROFIBAN HCL IV 12.5 MG/250 ML
INTRAVENOUS | Status: AC
  Filled 2016-01-25: qty 250

## 2016-01-25 MED ORDER — DEXTROSE 5 % IV SOLN
INTRAVENOUS | Status: AC
  Filled 2016-01-25: qty 1.5

## 2016-01-25 MED ORDER — MIDAZOLAM HCL 2 MG/2ML IJ SOLN
INTRAMUSCULAR | Status: DC | PRN
  Administered 2016-01-25 (×5): 1 mg via INTRAVENOUS
  Administered 2016-01-25: 3 mg via INTRAVENOUS

## 2016-01-25 MED ORDER — IOPAMIDOL (ISOVUE-300) INJECTION 61%
INTRAVENOUS | Status: DC | PRN
  Administered 2016-01-25: 100 mL via INTRAVENOUS

## 2016-01-25 MED ORDER — MIDAZOLAM HCL 2 MG/2ML IJ SOLN
INTRAMUSCULAR | Status: AC
  Filled 2016-01-25: qty 2

## 2016-01-25 MED ORDER — TIROFIBAN HCL IN NACL 5-0.9 MG/100ML-% IV SOLN
22.9200 ug/min | INTRAVENOUS | Status: DC
  Administered 2016-01-25 – 2016-01-26 (×5): 22.92 ug/min via INTRAVENOUS
  Filled 2016-01-25 (×4): qty 100

## 2016-01-25 MED ORDER — FENTANYL CITRATE (PF) 100 MCG/2ML IJ SOLN
INTRAMUSCULAR | Status: AC
  Filled 2016-01-25: qty 2

## 2016-01-25 MED ORDER — HYDROMORPHONE HCL 1 MG/ML IJ SOLN
INTRAMUSCULAR | Status: AC
  Administered 2016-01-25: 1 mg via INTRAVENOUS
  Filled 2016-01-25: qty 1

## 2016-01-25 MED ORDER — NITROGLYCERIN 5 MG/ML IV SOLN
INTRAVENOUS | Status: AC
  Filled 2016-01-25: qty 10

## 2016-01-25 MED ORDER — NITROGLYCERIN 1 MG/10 ML FOR IR/CATH LAB
INTRA_ARTERIAL | Status: DC | PRN
  Administered 2016-01-25 (×3): 500 ug via INTRA_ARTERIAL

## 2016-01-25 MED ORDER — DIPHENHYDRAMINE HCL 50 MG/ML IJ SOLN
INTRAMUSCULAR | Status: AC
  Filled 2016-01-25: qty 1

## 2016-01-25 MED ORDER — DIPHENHYDRAMINE HCL 50 MG/ML IJ SOLN
INTRAMUSCULAR | Status: DC | PRN
  Administered 2016-01-25: 50 mg via INTRAVENOUS

## 2016-01-25 MED ORDER — HEPARIN SODIUM (PORCINE) 1000 UNIT/ML IJ SOLN
INTRAMUSCULAR | Status: AC
  Filled 2016-01-25: qty 1

## 2016-01-25 MED ORDER — LIDOCAINE HCL (PF) 1 % IJ SOLN
INTRAMUSCULAR | Status: AC
  Filled 2016-01-25: qty 30

## 2016-01-25 SURGICAL SUPPLY — 21 items
BALLN LUTONIX 4X150X130 (BALLOONS) ×9
BALLN LUTONIX DCB 5X80X130 (BALLOONS) ×3
BALLN LUTONIX DCB 6X60X130 (BALLOONS) ×3
BALLN ULTRVRSE 3X20X75C (BALLOONS) IMPLANT
BALLN ULTRVRSE 3X250X130 (BALLOONS) ×6
BALLN ULTRVRSE 7X80X130C (BALLOONS) ×3
BALLOON LUTONIX 4X150X130 (BALLOONS) ×3 IMPLANT
BALLOON LUTONIX DCB 5X80X130 (BALLOONS) ×1 IMPLANT
BALLOON LUTONIX DCB 6X60X130 (BALLOONS) ×1 IMPLANT
BALLOON ULTRVRSE 3X250X130 (BALLOONS) ×2 IMPLANT
BALLOON ULTRVRSE 7X80X130C (BALLOONS) ×1 IMPLANT
CATH VERT 100CM (CATHETERS) ×3 IMPLANT
DEVICE PRESTO INFLATION (MISCELLANEOUS) ×3 IMPLANT
DEVICE SOLENT PROXI 90CM (CATHETERS) ×3 IMPLANT
DEVICE STARCLOSE SE CLOSURE (Vascular Products) ×3 IMPLANT
GLIDEWIRE ADV .035X260CM (WIRE) ×3 IMPLANT
PACK ANGIOGRAPHY (CUSTOM PROCEDURE TRAY) ×3 IMPLANT
SHIELD RADPAD SCOOP 12X17 (MISCELLANEOUS) ×3 IMPLANT
STENT TIGRIS 7X80X120 (Permanent Stent) ×3 IMPLANT
TOWEL OR 17X26 4PK STRL BLUE (TOWEL DISPOSABLE) ×3 IMPLANT
WIRE MAGIC TORQUE 260C (WIRE) ×3 IMPLANT

## 2016-01-25 NOTE — OR Nursing (Signed)
Heparin infusion via sheath and activase discontinued on arrival to [place]

## 2016-01-25 NOTE — Progress Notes (Signed)
ANTICOAGULATION CONSULT NOTE - Initial Consult  Pharmacy Consult for Heparin Indication: DVT  No Known Allergies  Patient Measurements: Height: 6\' 2"  (188 cm) Weight: (!) 400 lb (181.4 kg) IBW/kg (Calculated) : 82.2 Heparin Dosing Weight:  126.4 kg   Vital Signs: Temp: 98.3 F (36.8 C) (09/26 2300) Temp Source: Oral (09/26 2300) BP: 139/77 (09/26 2300) Pulse Rate: 75 (09/26 2300)  Labs:  Recent Labs  01/23/16 1331 01/24/16 1932 01/24/16 2329  HGB  --  16.2 16.1  HCT  --  49.9 48.1  PLT  --  159 147*  APTT  --   --  43*  HEPARINUNFRC  --  <0.10*  --   CREATININE 0.87  --   --     Estimated Creatinine Clearance: 155.7 mL/min (by C-G formula based on SCr of 0.87 mg/dL).   Medical History: Past Medical History:  Diagnosis Date  . Asthma   . Chronic kidney disease   . Coronary artery disease   . Depression   . Diabetes mellitus without complication ([place])   . Gout   . History anabolic steroid use   . Hyperlipidemia   . Hypertension   . Hypogonadism in male   . Morbid obesity ([place])   . Myocardial infarction ([place])   . Perirectal abscess   . Pleurisy   . Sleep apnea   . Varicella     Medications:  Scheduled:  . atorvastatin  20 mg Oral Daily  . clopidogrel  75 mg Oral Daily  . docusate sodium  100 mg Oral Daily  . heparin      . insulin aspart  0-15 Units Subcutaneous TID WC  . ipratropium-albuterol  3 mL Inhalation Q6H  . isosorbide mononitrate  30 mg Oral Daily  . losartan  100 mg Oral Daily  . metoprolol  5 mg Intravenous Q6H  . metoprolol  50 mg Oral BID  . pantoprazole  40 mg Oral Daily  . tamsulosin  0.4 mg Oral Daily  . cyanocobalamin  2,000 mcg Oral Daily    Assessment: Pharmacy consulted to dose heparin in this 60 year old male undergoing thrombolysis.   Goal of Therapy:  aPTT < 50 seconds   Plan:  Start heparin infusion at 400 units/hr  Will draw HL and aPTT 6 hrs X 4 .   Will decrease Heparin gtt by 100 units/hr if PTT > 50.     9/26 2329 aPTT 43. Continue current rate, will recheck aPTT in 6 hours.  Laural Benes, Pharm.D., BCPS Clinical Pharmacist 01/25/2016,1:02 AM

## 2016-01-25 NOTE — Progress Notes (Signed)
Patient refuses any treatment of toes, or ankle open area

## 2016-01-25 NOTE — Op Note (Signed)
[name] Percutaneous Study/Intervention Procedural Note   Date of Surgery: 01/25/2016  Surgeon: Hortencia Pilar  Pre-operative Diagnosis: Atherosclerotic occlusive disease bilateral lower extremities with gangrene and ulceration of the right lower extremity; follow-up angiography status post overnight lysis with TPA  Post-operative diagnosis: Same  Procedure(s) Performed: 1. Introduction catheter into right lower extremity 3rd order catheter placement  2. Contrast injection right lower extremity for distal runoff  3. Percutaneous transluminal angioplasty right superficial femoral and popliteal arteries to 8 mm 4. Percutaneous transluminal angioplasty and stent placement right anterior tibial artery  5. Mechanical thrombectomy using the AngioJet of the distal SFA and popliteal.             6.  Mechanical thrombectomy using AngioJet of the anterior tibial artery              7.  Star close closure right superficial femoral arteriotomy which failed  Anesthesia: Conscious sedation was administered under my direct supervision. IV Versed plus fentanyl were utilized. Continuous ECG, pulse oximetry and blood pressure was monitored throughout the entire procedure.  Conscious sedation was for [name] total of 2 hour 25 minutes.  Sheath: 6 French sheath antegrade right SFA existing  Contrast: 60 cc  Fluoroscopy Time: Approximately 20 minutes  Indications: [name]. presents with increasing pain of the right lower extremity. [name] has been lysed overnight and is now returning for further intervention for limb salvage This suggests the patient is having limb threatening ischemia. The risks and benefits are reviewed all questions answered patient agrees to proceed.  Procedure:[name]. is [name] 60 y.o. y.o. male who was identified and appropriate procedural time out was performed. The  patient was then placed supine on the table and prepped and draped in the usual sterile fashion.   The right leg is prepped and draped in [name] sterile fashion and the existing infusion catheter is removed over [name] Magic torque wire. Hand injection contrast through the existing sheath demonstrates no flow throughout the entire system essentially no change compared to yesterday. An advantage wire and angled catheter are then used to negotiate the wire catheter into the peroneal. Hand injection contrast at this level demonstrates complete occlusion at multiple levels. The catheter and wire were then renegotiated. Ultimately, I was able to negotiate an advantage wire with the catheter into the anterior tibial through [name] previously noted occlusion on yesterday's angiogram. Distal runoff was then obtained demonstrating patency of the anterior tibial to the foot. This represents the additional third order catheter placement as the peroneal was the initial tibial vessel interrogated.  5000 units of heparin was then given and allowed to circulate.  The DBX catheter was then prepped on the back table and advanced and mechanical thrombectomy was performed with multiple passes through the SFA popliteal as well as the proximal one third of the anterior tibial. Following this there was minimal flow noted. Initially [name] 3 mm balloon was used to treat the anterior tibial subsequently [name] 4 mm Lutonix balloon was used to treat the anterior tibial ultimately [name] 6 mm balloon which was [name] Lutonix balloon was used to treat the popliteal and anterior tibial. Further inflation of [name] 5 mm balloon in the anterior tibial was performed this was also [name] Lutonix. At this point both the distal half of the popliteal have been treated as well as the proximal one half of the anterior tibial. Dissection was noted which extended approximately 2 cm into the anterior tibial and  therefore [name] 7 x 80 [place] stent was deployed into the anterior tibial is  postdilated to 6 mm. An 8 mm balloon inflation was used to treat the pre-existing Viabahn stents with the proximal or leading edge of the [place]. At this point flow had been reestablished however there was clearly narrowing of the mid and distal anterior tibial which was not present on previous images this was consistent with spasm and [name] total of 1500 g of nitroglycerin were infused with excellent result.  At this time we now had in-line flow from the mid SFA through the sheath down to the foot. Oblique images of the right thigh was obtained and [name] Star close device was attempted but this failed and pressure was held for 30 minutes with excellent result.  Findings: The superficial femoral and popliteal arteries initially demonstrate no flow with thrombus but after mechanical thrombectomy are noted to be widely patent.  The distal popliteal demonstrates increasing disease and [name] subtotal occlusion the trifurcation is heavily diseased with occlusion of the anterior tibial peroneal demonstrates [name]  total occlusion in its proximal portion but distally is the best runoff to the foot.  Peroneal tibioperoneal trunk and posterior tibial are not visualized in the proximal calf they do reconstitute distally.  Following angioplasty and placement of [name] stent the anterior  tibial now is in-line flow and looks quite nice. Angioplasty of the popliteal and SFA at Hunter's canal and across the knee yields an excellent result with Wood than 10% residual stenosis.  Summary: Successful recanalization right lower cavity for limb salvage   Disposition: Patient was taken to the recovery room in stable condition having tolerated the procedure well.  [name] 01/25/2016,12:14 PM

## 2016-01-25 NOTE — OR Nursing (Signed)
Dr schnier requested start dose with dilaudid one mg , given, respiratory to setup CPAP machine

## 2016-01-25 NOTE — Progress Notes (Signed)
Placed pt on [place] CPAP for sleep. Water added to humidity chamber. Unit plugged into red outlet. Pt tolerating well.

## 2016-01-25 NOTE — OR Nursing (Signed)
Unable to assess ETCO2 due to CPAP

## 2016-01-25 NOTE — Progress Notes (Signed)
Patient flat per MD order

## 2016-01-25 NOTE — Progress Notes (Signed)
Chaplain rounded the unit to provide a compassionate presence and support to the patient.  Patient requested prayer. Patient along with Chaplain and his mother joined hands. Prayer was said and patient appeared comforted. Minerva Fester [phone]

## 2016-01-25 NOTE — OR Nursing (Signed)
Start aggrastat at 12:30 per Dr Delana Meyer if groin site stable.

## 2016-01-25 NOTE — OR Nursing (Signed)
Reports itch on chest, no rash noted, itched for pt and given diphenhydramine iv per order

## 2016-01-25 NOTE — Progress Notes (Signed)
Right leg remains with dopplered  posterior tibial. and pedal pulses. Foot warm. Groin site dry. Straight leg time up at 1725. Tolerating clear liquid diet well. Morphine x 1 for right leg pain.

## 2016-01-25 NOTE — Progress Notes (Signed)
Pt remains alert and oriented and has been sinus rhythm on the monitor.  Foley remains in place and has had 1250cc out.  Heparin and TPA remains infusing.  Right groin site remains clean, dry and intact.  No hematoma noted, no bleeding noted.  Right foot is warm to the touch.  Pulses are dopplered.  Pt did lay flat all night.

## 2016-01-25 NOTE — Progress Notes (Signed)
None per patient request

## 2016-01-25 NOTE — Progress Notes (Signed)
Pt placed on [facility] RESMED CPAP. CPAP plugged into red outlet.

## 2016-01-25 NOTE — Progress Notes (Signed)
0800 Down to specials for vascular procedure.

## 2016-01-26 ENCOUNTER — Encounter: Payer: Self-pay | Admitting: *Deleted

## 2016-01-26 DIAGNOSIS — I714 Abdominal aortic aneurysm, without rupture: Secondary | ICD-10-CM | POA: Diagnosis not present

## 2016-01-26 DIAGNOSIS — I70219 Atherosclerosis of native arteries of extremities with intermittent claudication, unspecified extremity: Secondary | ICD-10-CM | POA: Diagnosis not present

## 2016-01-26 DIAGNOSIS — I10 Essential (primary) hypertension: Secondary | ICD-10-CM | POA: Diagnosis not present

## 2016-01-26 DIAGNOSIS — E669 Obesity, unspecified: Secondary | ICD-10-CM | POA: Diagnosis not present

## 2016-01-26 DIAGNOSIS — I739 Peripheral vascular disease, unspecified: Secondary | ICD-10-CM | POA: Diagnosis not present

## 2016-01-26 DIAGNOSIS — M79609 Pain in unspecified limb: Secondary | ICD-10-CM | POA: Diagnosis not present

## 2016-01-26 DIAGNOSIS — I70238 Atherosclerosis of native arteries of right leg with ulceration of other part of lower right leg: Secondary | ICD-10-CM | POA: Diagnosis not present

## 2016-01-26 DIAGNOSIS — E119 Type 2 diabetes mellitus without complications: Secondary | ICD-10-CM | POA: Diagnosis not present

## 2016-01-26 DIAGNOSIS — I724 Aneurysm of artery of lower extremity: Secondary | ICD-10-CM | POA: Diagnosis not present

## 2016-01-26 DIAGNOSIS — I70221 Atherosclerosis of native arteries of extremities with rest pain, right leg: Secondary | ICD-10-CM | POA: Diagnosis not present

## 2016-01-26 LAB — CBC
HCT: 43.3 % (ref 40.0–52.0)
Hemoglobin: 14.9 g/dL (ref 13.0–18.0)
MCH: 31 pg (ref 26.0–34.0)
MCHC: 34.4 g/dL (ref 32.0–36.0)
MCV: 90.1 fL (ref 80.0–100.0)
PLATELETS: 126 10*3/uL — AB (ref 150–440)
RBC: 4.81 MIL/uL (ref 4.40–5.90)
RDW: 14.7 % — AB (ref 11.5–14.5)
WBC: 11 10*3/uL — AB (ref 3.8–10.6)

## 2016-01-26 LAB — GLUCOSE, CAPILLARY: Glucose-Capillary: 153 mg/dL — ABNORMAL HIGH (ref 65–99)

## 2016-01-26 MED ORDER — APIXABAN 5 MG PO TABS: 5.0000 mg | ORAL_TABLET | Freq: Two times a day (BID) | ORAL | 5 refills | Status: DC

## 2016-01-26 MED ORDER — OXYCODONE HCL 5 MG PO TABS: ORAL_TABLET | ORAL | 0 refills | Status: DC

## 2016-01-26 NOTE — Discharge Summary (Signed)
[facility]    Discharge Summary  Patient ID:  [name]. MRN: [id number] DOB/AGE: [birth year] 60 y.o.  Admit date: 01/24/2016 Discharge date: 01/26/2016 Date of Surgery: 01/24/2016 - 01/25/2016 Surgeon: Juliann Mule): Katha Cabal, MD  Admission Diagnosis: RT lower extremity angio     ASO w gangrene Ischemia of lower extremity  Discharge Diagnoses:  RT lower extremity angio     ASO w gangrene Ischemia of lower extremity  Secondary Diagnoses: Past Medical History:  Diagnosis Date  . Asthma   . Chronic kidney disease   . Coronary artery disease   . Depression   . Diabetes mellitus without complication ([place])   . Gout   . History anabolic steroid use   . Hyperlipidemia   . Hypertension   . Hypogonadism in male   . Morbid obesity ([place])   . Myocardial infarction ([place])   . Perirectal abscess   . Pleurisy   . Sleep apnea   . Varicella    Procedure(s): Lower Extremity Angiography  Discharged Condition: good  HPI:  The patient is a 60 year old male who presented with an ischemic right leg, PAD, popliteal artery aneurysm s/p repair with ulceration and gangrene of the right second toe and ankle. On 01/24/16, the patient underwent a introduction catheter into right lower extremity 2rd order catheter placement, contrast injection right lower extremity for distal runoff, infusion of TPA for thrombolysis right lower extremity; subsequent continued thrombolysis, angiojet mechanical thrombectomy with the DBX device right distal SFA and popliteal with percutaneous transluminal angioplasty right superficial femoral artery and popliteal. He tolerated the procedure well and was transferred to the ICU with heparin infusion overnight. He was taken back to the radiology suite the next morning and underwent a introduction catheter into right lower extremity 3rd order catheter placement, contrast injection right lower extremity for distal runoff, percutaneous  transluminal angioplasty right superficial femoral and popliteal arteries to 8 mm, percutaneous transluminal angioplasty and stent placement right anterior tibial artery, mechanical thrombectomy using the AngioJet of the distal SFA and popliteal, mechanical thrombectomy using AngioJet of the anterior tibial artery with Star close closure right superficial femoral arteriotomy which failed. He tolerated this procedure well and was transferred back to the ICU with Aggrastat running overnight.   During this brief hospital stay, the patient diet was advanced, his foley was removed without issue, his pain was controlled with PO medications and he was ambulating independently.   Both procedures were successful and the patients right lower extremity was revascularized.   Hospital Course:  Nivan Mckeague. is a 60 y.o. male is S/P: Procedure(s): Right Lower Extremity Angiography  Physical exam:  A&Ox3, NAD CV: RRR Pulm: CTA bilaterally Abdomen: soft, non-tender, non-distended, bowel sounds present. Right Groin: Central Line intact, clean and dry. No swelling or drainage noted. GU: Foley in place draining clear urine Right Lower Extremity: warm to toes. Right Foot: tip of second toe with dry gangrene. Medial aspect of ankle with small non-granulating, non-infected wound.  Post-op wounds healing well  Pt. Ambulating, voiding and taking PO diet without difficulty.  Pt pain controlled with PO pain meds.  Labs as below  Complications:none  Consults:  None  Significant Diagnostic Studies: CBC Lab Results  Component Value Date   WBC 11.0 (H) 01/26/2016   HGB 14.9 01/26/2016   HCT 43.3 01/26/2016   MCV 90.1 01/26/2016   PLT 126 (L) 01/26/2016   BMET    Component Value Date/Time   NA  135 01/25/2016 0626   NA 141 07/12/2011 0919   K 4.5 01/25/2016 0626   K 4.4 07/12/2011 0919   CL 104 01/25/2016 0626   CL 102 07/12/2011 0919   CO2 27 01/25/2016 0626   CO2 29 07/12/2011 0919    GLUCOSE 152 (H) 01/25/2016 0626   GLUCOSE 76 07/12/2011 0919   BUN 15 01/25/2016 0626   BUN 16 05/05/2013 1022   CREATININE 0.78 01/25/2016 0626   CREATININE 0.71 05/05/2013 1022   CALCIUM 8.5 (L) 01/25/2016 0626   CALCIUM 8.8 07/12/2011 0919   GFRNONAA >60 01/25/2016 0626   GFRNONAA >60 05/05/2013 1022   GFRAA >60 01/25/2016 0626   GFRAA >60 05/05/2013 1022   COAG No results found for: INR, PROTIME  Disposition:  Discharge to :Home    Medication List    STOP taking these medications   clopidogrel 75 MG tablet Commonly known as:  PLAVIX     TAKE these medications   albuterol (2.5 MG/3ML) 0.083% nebulizer solution Commonly known as:  PROVENTIL Take 2.5 mg by nebulization every 6 (six) hours as needed for wheezing or shortness of breath.   VENTOLIN HFA 108 (90 Base) MCG/ACT inhaler Generic drug:  albuterol Inhale into the lungs every 6 (six) hours as needed for wheezing or shortness of breath.   albuterol-ipratropium 18-103 MCG/ACT inhaler Commonly known as:  COMBIVENT Inhale into the lungs every 4 (four) hours.   apixaban 5 MG Tabs tablet Commonly known as:  ELIQUIS Take 1 tablet (5 mg total) by mouth 2 (two) times daily.   atorvastatin 20 MG tablet Commonly known as:  LIPITOR Take 20 mg by mouth daily.   Biotin 10 MG Caps Take by mouth.   cyanocobalamin 2000 MCG tablet Take 2,000 mcg by mouth daily.   furosemide 20 MG tablet Commonly known as:  LASIX Take 20 mg by mouth daily as needed.   isosorbide mononitrate 30 MG 24 hr tablet Commonly known as:  IMDUR Take 30 mg by mouth daily.   losartan 100 MG tablet Commonly known as:  COZAAR Take 100 mg by mouth daily.   metformin 1000 MG (OSM) 24 hr tablet Commonly known as:  FORTAMET Take 1,000 mg by mouth 2 (two) times daily with a meal.   metoprolol 50 MG tablet Commonly known as:  LOPRESSOR Take 50 mg by mouth 2 (two) times daily.   oxyCODONE 5 MG immediate release tablet Commonly known as:  Oxy  IR/ROXICODONE One to Two Tabs by Mouth Every Four to Six Hours As Needed for Pain   tamsulosin 0.4 MG Caps capsule Commonly known as:  FLOMAX Take 0.4 mg by mouth daily.      Verbal and written Discharge instructions given to the patient. Wound care per Discharge AVS Follow-up Information    Hortencia Pilar, MD Follow up in 2 week(s).   Specialties:  Vascular Surgery, Cardiology, Radiology, Vascular Surgery Why:  Post-op Check with ABI Contact information: [address] [place] [address] [phone]          Signed: Sela Hua, PA-C  01/26/2016, 8:13 AM

## 2016-01-26 NOTE — Discharge Instructions (Signed)
Stop Plavix and Start Eliquis. Start ASA 81mg  by mouth daily. You may shower as of tomorrow.

## 2016-01-26 NOTE — Progress Notes (Signed)
Pt given discharge instructions and prescriptions. Pt verbalized understanding of instructions. 22 gauge IV removed from right forearm. Catheter intact. Pt taken via wheelchair to visitor entrance where mother picked him up in her car. Akyah Lagrange E 10:13 AM 01/26/2016

## 2016-01-27 ENCOUNTER — Encounter: Payer: Self-pay | Admitting: Vascular Surgery

## 2016-02-06 DIAGNOSIS — E785 Hyperlipidemia, unspecified: Secondary | ICD-10-CM | POA: Diagnosis not present

## 2016-02-06 DIAGNOSIS — E538 Deficiency of other specified B group vitamins: Secondary | ICD-10-CM | POA: Diagnosis not present

## 2016-02-06 DIAGNOSIS — Z794 Long term (current) use of insulin: Secondary | ICD-10-CM | POA: Diagnosis not present

## 2016-02-06 DIAGNOSIS — E1121 Type 2 diabetes mellitus with diabetic nephropathy: Secondary | ICD-10-CM | POA: Diagnosis not present

## 2016-02-08 DIAGNOSIS — I96 Gangrene, not elsewhere classified: Secondary | ICD-10-CM | POA: Diagnosis not present

## 2016-02-08 DIAGNOSIS — E1142 Type 2 diabetes mellitus with diabetic polyneuropathy: Secondary | ICD-10-CM | POA: Diagnosis not present

## 2016-02-08 DIAGNOSIS — L97512 Non-pressure chronic ulcer of other part of right foot with fat layer exposed: Secondary | ICD-10-CM | POA: Diagnosis not present

## 2016-02-09 ENCOUNTER — Ambulatory Visit
Admission: RE | Admit: 2016-02-09 | Discharge: 2016-02-09 | Disposition: A | Discharge status: Home / Self Care | Payer: PPO | Source: Ambulatory Visit | Attending: Podiatry | Admitting: Podiatry

## 2016-02-09 DIAGNOSIS — Z7982 Long term (current) use of aspirin: Secondary | ICD-10-CM | POA: Diagnosis not present

## 2016-02-09 DIAGNOSIS — Z7901 Long term (current) use of anticoagulants: Secondary | ICD-10-CM | POA: Diagnosis not present

## 2016-02-09 DIAGNOSIS — E538 Deficiency of other specified B group vitamins: Secondary | ICD-10-CM | POA: Diagnosis not present

## 2016-02-09 DIAGNOSIS — L97519 Non-pressure chronic ulcer of other part of right foot with unspecified severity: Secondary | ICD-10-CM | POA: Diagnosis not present

## 2016-02-09 DIAGNOSIS — J45909 Unspecified asthma, uncomplicated: Secondary | ICD-10-CM | POA: Diagnosis not present

## 2016-02-09 DIAGNOSIS — I2510 Atherosclerotic heart disease of native coronary artery without angina pectoris: Secondary | ICD-10-CM | POA: Diagnosis not present

## 2016-02-09 DIAGNOSIS — Z951 Presence of aortocoronary bypass graft: Secondary | ICD-10-CM | POA: Diagnosis not present

## 2016-02-09 DIAGNOSIS — Z794 Long term (current) use of insulin: Secondary | ICD-10-CM | POA: Diagnosis not present

## 2016-02-09 DIAGNOSIS — E1122 Type 2 diabetes mellitus with diabetic chronic kidney disease: Secondary | ICD-10-CM | POA: Diagnosis not present

## 2016-02-09 DIAGNOSIS — G4710 Hypersomnia, unspecified: Secondary | ICD-10-CM | POA: Diagnosis not present

## 2016-02-09 DIAGNOSIS — Z79899 Other long term (current) drug therapy: Secondary | ICD-10-CM | POA: Diagnosis not present

## 2016-02-09 DIAGNOSIS — I252 Old myocardial infarction: Secondary | ICD-10-CM | POA: Diagnosis not present

## 2016-02-09 DIAGNOSIS — L97319 Non-pressure chronic ulcer of right ankle with unspecified severity: Secondary | ICD-10-CM | POA: Diagnosis not present

## 2016-02-09 DIAGNOSIS — Z6841 Body Mass Index (BMI) 40.0 and over, adult: Secondary | ICD-10-CM | POA: Diagnosis not present

## 2016-02-09 DIAGNOSIS — F17290 Nicotine dependence, other tobacco product, uncomplicated: Secondary | ICD-10-CM | POA: Diagnosis not present

## 2016-02-09 DIAGNOSIS — E1152 Type 2 diabetes mellitus with diabetic peripheral angiopathy with gangrene: Secondary | ICD-10-CM | POA: Diagnosis not present

## 2016-02-09 DIAGNOSIS — G4730 Sleep apnea, unspecified: Secondary | ICD-10-CM | POA: Diagnosis not present

## 2016-02-09 DIAGNOSIS — M869 Osteomyelitis, unspecified: Secondary | ICD-10-CM | POA: Diagnosis not present

## 2016-02-09 DIAGNOSIS — N189 Chronic kidney disease, unspecified: Secondary | ICD-10-CM | POA: Diagnosis not present

## 2016-02-09 DIAGNOSIS — I96 Gangrene, not elsewhere classified: Secondary | ICD-10-CM | POA: Diagnosis not present

## 2016-02-09 DIAGNOSIS — I129 Hypertensive chronic kidney disease with stage 1 through stage 4 chronic kidney disease, or unspecified chronic kidney disease: Secondary | ICD-10-CM | POA: Diagnosis not present

## 2016-02-09 HISTORY — DX: Peripheral vascular disease, unspecified: I73.9

## 2016-02-09 LAB — BASIC METABOLIC PANEL
Anion gap: 6 (ref 5–15)
BUN: 14 mg/dL (ref 6–20)
CALCIUM: 8.9 mg/dL (ref 8.9–10.3)
CHLORIDE: 103 mmol/L (ref 101–111)
CO2: 29 mmol/L (ref 22–32)
CREATININE: 0.8 mg/dL (ref 0.61–1.24)
GFR calc Af Amer: >60 mL/min (ref >60)
GFR calc non Af Amer: >60 mL/min (ref >60)
Glucose, Bld: 85 mg/dL (ref 65–99)
Potassium: 4.2 mmol/L (ref 3.5–5.1)
Sodium: 138 mmol/L (ref 135–145)

## 2016-02-09 NOTE — Patient Instructions (Addendum)
  Your procedure is scheduled on: 02/10/16 Fri @ 12:15pm Report to Same Day Surgery 2nd floor medical mall  Remember: Instructions that are not followed completely may result in serious medical risk, up to and including death, or upon the discretion of your surgeon and anesthesiologist your surgery may need to be rescheduled.    _x___ 1. Do not eat food or drink liquids after midnight. No gum chewing or hard candies.     __x__ 2. No Alcohol for 24 hours before or after surgery.   __x__3. No Smoking for 24 prior to surgery.   ____  4. Bring all medications with you on the day of surgery if instructed.    __x__ 5. Notify your doctor if there is any change in your medical condition     (cold, fever, infections).     Do not wear jewelry, make-up, hairpins, clips or nail polish.  Do not wear lotions, powders, or perfumes. You may wear deodorant.  Do not shave 48 hours prior to surgery. Men may shave face and neck.  Do not bring valuables to the hospital.    [facility] is not responsible for any belongings or valuables.               Contacts, dentures or bridgework may not be worn into surgery.  Leave your suitcase in the car. After surgery it may be brought to your room.  For patients admitted to the hospital, discharge time is determined by your treatment team.   Patients discharged the day of surgery will not be allowed to drive home.    Please read over the following fact sheets that you were given:   [facility] Preparing for Surgery and or MRSA Information   _x___ Take these medicines the morning of surgery with A SIP OF WATER:    1. albuterol (VENTOLIN HFA) 108 (90 Base) MCG/ACT inhaler  2.isosorbide mononitrate (IMDUR) 30 MG 24 hr tablet  3.losartan (COZAAR) 100 MG tablet  4.metoprolol (LOPRESSOR) 50 MG tablet  5.  6.  ____Fleets enema or Magnesium Citrate as directed.   _x___ Use CHG Soap or sage wipes as directed on instruction sheet   _x___ Use inhalers on the day  of surgery and bring to hospital day of surgery  _x___ Stop metformin 2 days prior to surgery    _x___ Take 1/2 of usual insulin dose the night before surgery and none on the morning of           surgery.   _x___ Stop aspirin or coumadin, or plavix Follow Dr Vickki Muff instructions for Aspirin and Eliquis  x__ Stop Anti-inflammatories such as Advil, Aleve, Ibuprofen, Motrin, Naproxen,          Naprosyn, Goodies powders or aspirin products. Ok to take Tylenol.   ____ Stop supplements until after surgery.    ____ Bring C-Pap to the hospital.

## 2016-02-10 ENCOUNTER — Encounter
Admission: RE | Disposition: A | Discharge status: Home / Self Care | Payer: Self-pay | Source: Ambulatory Visit | Attending: Podiatry

## 2016-02-10 ENCOUNTER — Ambulatory Visit: Payer: PPO | Admitting: Anesthesiology

## 2016-02-10 ENCOUNTER — Ambulatory Visit
Admission: RE | Admit: 2016-02-10 | Discharge: 2016-02-10 | Disposition: A | Discharge status: Home / Self Care | Payer: PPO | Source: Ambulatory Visit | Attending: Podiatry | Admitting: Podiatry

## 2016-02-10 DIAGNOSIS — L97512 Non-pressure chronic ulcer of other part of right foot with fat layer exposed: Secondary | ICD-10-CM | POA: Diagnosis not present

## 2016-02-10 DIAGNOSIS — I2510 Atherosclerotic heart disease of native coronary artery without angina pectoris: Secondary | ICD-10-CM | POA: Insufficient documentation

## 2016-02-10 DIAGNOSIS — L97311 Non-pressure chronic ulcer of right ankle limited to breakdown of skin: Secondary | ICD-10-CM | POA: Diagnosis not present

## 2016-02-10 DIAGNOSIS — Z7982 Long term (current) use of aspirin: Secondary | ICD-10-CM | POA: Insufficient documentation

## 2016-02-10 DIAGNOSIS — E1152 Type 2 diabetes mellitus with diabetic peripheral angiopathy with gangrene: Secondary | ICD-10-CM | POA: Diagnosis not present

## 2016-02-10 DIAGNOSIS — I129 Hypertensive chronic kidney disease with stage 1 through stage 4 chronic kidney disease, or unspecified chronic kidney disease: Secondary | ICD-10-CM | POA: Insufficient documentation

## 2016-02-10 DIAGNOSIS — M869 Osteomyelitis, unspecified: Secondary | ICD-10-CM | POA: Diagnosis not present

## 2016-02-10 DIAGNOSIS — L97519 Non-pressure chronic ulcer of other part of right foot with unspecified severity: Secondary | ICD-10-CM | POA: Insufficient documentation

## 2016-02-10 DIAGNOSIS — L97319 Non-pressure chronic ulcer of right ankle with unspecified severity: Secondary | ICD-10-CM | POA: Insufficient documentation

## 2016-02-10 DIAGNOSIS — Z79899 Other long term (current) drug therapy: Secondary | ICD-10-CM | POA: Insufficient documentation

## 2016-02-10 DIAGNOSIS — Z7901 Long term (current) use of anticoagulants: Secondary | ICD-10-CM | POA: Insufficient documentation

## 2016-02-10 DIAGNOSIS — I252 Old myocardial infarction: Secondary | ICD-10-CM | POA: Insufficient documentation

## 2016-02-10 DIAGNOSIS — Z6841 Body Mass Index (BMI) 40.0 and over, adult: Secondary | ICD-10-CM | POA: Insufficient documentation

## 2016-02-10 DIAGNOSIS — E538 Deficiency of other specified B group vitamins: Secondary | ICD-10-CM | POA: Insufficient documentation

## 2016-02-10 DIAGNOSIS — I96 Gangrene, not elsewhere classified: Secondary | ICD-10-CM | POA: Diagnosis not present

## 2016-02-10 DIAGNOSIS — G4710 Hypersomnia, unspecified: Secondary | ICD-10-CM | POA: Insufficient documentation

## 2016-02-10 DIAGNOSIS — E1122 Type 2 diabetes mellitus with diabetic chronic kidney disease: Secondary | ICD-10-CM | POA: Insufficient documentation

## 2016-02-10 DIAGNOSIS — F17290 Nicotine dependence, other tobacco product, uncomplicated: Secondary | ICD-10-CM | POA: Insufficient documentation

## 2016-02-10 DIAGNOSIS — Z951 Presence of aortocoronary bypass graft: Secondary | ICD-10-CM | POA: Insufficient documentation

## 2016-02-10 DIAGNOSIS — Z794 Long term (current) use of insulin: Secondary | ICD-10-CM | POA: Insufficient documentation

## 2016-02-10 DIAGNOSIS — G4730 Sleep apnea, unspecified: Secondary | ICD-10-CM | POA: Insufficient documentation

## 2016-02-10 DIAGNOSIS — N189 Chronic kidney disease, unspecified: Secondary | ICD-10-CM | POA: Insufficient documentation

## 2016-02-10 DIAGNOSIS — J45909 Unspecified asthma, uncomplicated: Secondary | ICD-10-CM | POA: Insufficient documentation

## 2016-02-10 HISTORY — PX: AMPUTATION TOE: SHX6595

## 2016-02-10 LAB — GLUCOSE, CAPILLARY
Glucose-Capillary: 104 mg/dL — ABNORMAL HIGH (ref 65–99)
Glucose-Capillary: 107 mg/dL — ABNORMAL HIGH (ref 65–99)

## 2016-02-10 SURGERY — AMPUTATION, TOE
Anesthesia: Monitor Anesthesia Care | Site: Toe | Laterality: Right | Wound class: Dirty or Infected

## 2016-02-10 MED ORDER — FENTANYL CITRATE (PF) 100 MCG/2ML IJ SOLN: 25.0000 ug | INTRAMUSCULAR | Status: DC | PRN

## 2016-02-10 MED ORDER — CEFAZOLIN SODIUM-DEXTROSE 2-4 GM/100ML-% IV SOLN
2.0000 g | Freq: Once | INTRAVENOUS | Status: AC
  Administered 2016-02-10: 2 g via INTRAVENOUS

## 2016-02-10 MED ORDER — LIDOCAINE HCL (PF) 2 % IJ SOLN
INTRAMUSCULAR | Status: DC | PRN
  Administered 2016-02-10: 60 mg

## 2016-02-10 MED ORDER — BUPIVACAINE HCL 0.5 % IJ SOLN
INTRAMUSCULAR | Status: DC | PRN
  Administered 2016-02-10: 7 mL

## 2016-02-10 MED ORDER — PROPOFOL 10 MG/ML IV BOLUS
INTRAVENOUS | Status: DC | PRN
  Administered 2016-02-10: 40 mg via INTRAVENOUS
  Administered 2016-02-10: 30 mg via INTRAVENOUS

## 2016-02-10 MED ORDER — LIDOCAINE HCL (PF) 1 % IJ SOLN
INTRAMUSCULAR | Status: AC
  Filled 2016-02-10: qty 30

## 2016-02-10 MED ORDER — ONDANSETRON HCL 4 MG/2ML IJ SOLN: 4.0000 mg | Freq: Once | INTRAMUSCULAR | Status: DC | PRN

## 2016-02-10 MED ORDER — OXYCODONE-ACETAMINOPHEN 5-325 MG PO TABS: 1.0000 | ORAL_TABLET | ORAL | 0 refills | Status: DC | PRN

## 2016-02-10 MED ORDER — FAMOTIDINE 20 MG PO TABS
20.0000 mg | ORAL_TABLET | Freq: Once | ORAL | Status: AC
  Administered 2016-02-10: 20 mg via ORAL

## 2016-02-10 MED ORDER — SODIUM CHLORIDE 0.9 % IV SOLN
INTRAVENOUS | Status: DC
  Administered 2016-02-10: 13:00:00 via INTRAVENOUS

## 2016-02-10 MED ORDER — BUPIVACAINE HCL (PF) 0.5 % IJ SOLN
INTRAMUSCULAR | Status: AC
  Filled 2016-02-10: qty 30

## 2016-02-10 MED ORDER — MIDAZOLAM HCL 5 MG/5ML IJ SOLN
INTRAMUSCULAR | Status: DC | PRN
  Administered 2016-02-10: 2 mg via INTRAVENOUS

## 2016-02-10 MED ORDER — FAMOTIDINE 20 MG PO TABS
ORAL_TABLET | ORAL | Status: AC
  Administered 2016-02-10: 20 mg via ORAL
  Filled 2016-02-10: qty 1

## 2016-02-10 MED ORDER — CEFAZOLIN SODIUM-DEXTROSE 2-4 GM/100ML-% IV SOLN
INTRAVENOUS | Status: AC
  Filled 2016-02-10: qty 100

## 2016-02-10 SURGICAL SUPPLY — 40 items
BANDAGE ELASTIC 4 LF NS (GAUZE/BANDAGES/DRESSINGS) ×2 IMPLANT
BANDAGE STRETCH 3X4.1 STRL (GAUZE/BANDAGES/DRESSINGS) ×4 IMPLANT
BLADE OSC/SAGITTAL MD 5.5X18 (BLADE) ×2 IMPLANT
BLADE SURG MINI STRL (BLADE) ×2 IMPLANT
BNDG ESMARK 4X12 TAN STRL LF (GAUZE/BANDAGES/DRESSINGS) ×2 IMPLANT
BNDG GAUZE 4.5X4.1 6PLY STRL (MISCELLANEOUS) ×2 IMPLANT
CANISTER SUCT 1200ML W/VALVE (MISCELLANEOUS) ×2 IMPLANT
DRAPE FLUOR MINI C-ARM 54X84 (DRAPES) ×2 IMPLANT
DRAPE XRAY CASSETTE 23X24 (DRAPES) ×2 IMPLANT
DURAPREP 26ML APPLICATOR (WOUND CARE) ×2 IMPLANT
ELECT REM PT RETURN 9FT ADLT (ELECTROSURGICAL) ×2
ELECTRODE REM PT RTRN 9FT ADLT (ELECTROSURGICAL) ×1 IMPLANT
GAUZE IODOFORM PACK 1/2 7832 (GAUZE/BANDAGES/DRESSINGS) ×2 IMPLANT
GAUZE PETRO XEROFOAM 1X8 (MISCELLANEOUS) ×2 IMPLANT
GAUZE SPONGE 4X4 12PLY STRL (GAUZE/BANDAGES/DRESSINGS) ×2 IMPLANT
GAUZE STRETCH 2X75IN STRL (MISCELLANEOUS) ×2 IMPLANT
GLOVE BIO SURGEON STRL SZ7.5 (GLOVE) ×2 IMPLANT
GLOVE INDICATOR 8.0 STRL GRN (GLOVE) ×2 IMPLANT
GOWN STRL REUS W/ TWL LRG LVL3 (GOWN DISPOSABLE) ×2 IMPLANT
GOWN STRL REUS W/TWL LRG LVL3 (GOWN DISPOSABLE) ×2
HANDPIECE INTERPULSE COAX TIP (DISPOSABLE) ×1
HEMOSTAT SURGICEL 4X8 (HEMOSTASIS) ×2 IMPLANT
KIT RM TURNOVER STRD PROC AR (KITS) ×2 IMPLANT
LABEL OR SOLS (LABEL) ×2 IMPLANT
NEEDLE FILTER BLUNT 18X 1/2SAF (NEEDLE) ×1
NEEDLE FILTER BLUNT 18X1 1/2 (NEEDLE) ×1 IMPLANT
NEEDLE HYPO 25X1 1.5 SAFETY (NEEDLE) ×2 IMPLANT
NS IRRIG 500ML POUR BTL (IV SOLUTION) ×2 IMPLANT
PACK EXTREMITY ARMC (MISCELLANEOUS) ×2 IMPLANT
PAD ABD DERMACEA PRESS 5X9 (GAUZE/BANDAGES/DRESSINGS) ×4 IMPLANT
SET HNDPC FAN SPRY TIP SCT (DISPOSABLE) ×1 IMPLANT
SOL .9 NS 3000ML IRR  AL (IV SOLUTION) ×1
SOL .9 NS 3000ML IRR UROMATIC (IV SOLUTION) ×1 IMPLANT
STOCKINETTE M/LG 89821 (MISCELLANEOUS) ×2 IMPLANT
STRAP SAFETY BODY (MISCELLANEOUS) ×2 IMPLANT
SUT ETHILON 3-0 FS-10 30 BLK (SUTURE) ×2
SUT ETHILON 5-0 FS-2 18 BLK (SUTURE) ×2 IMPLANT
SUT VIC AB 4-0 FS2 27 (SUTURE) ×2 IMPLANT
SUTURE EHLN 3-0 FS-10 30 BLK (SUTURE) ×1 IMPLANT
SYRINGE 10CC LL (SYRINGE) ×6 IMPLANT

## 2016-02-10 NOTE — Anesthesia Preprocedure Evaluation (Signed)
Anesthesia Evaluation  Patient identified by MRN, date of birth, ID band Patient awake    Reviewed: Allergy & Precautions, H&P , NPO status , Patient's Chart, lab work & pertinent test results, reviewed documented beta blocker date and time   Airway Mallampati: II  TM Distance: >3 FB Neck ROM: full    Dental no notable dental hx. (+) Teeth Intact   Pulmonary neg pulmonary ROS, asthma , sleep apnea and Continuous Positive Airway Pressure Ventilation , Current Smoker,    Pulmonary exam normal breath sounds clear to auscultation       Cardiovascular Exercise Tolerance: Good hypertension, + CAD, + Past MI and + Peripheral Vascular Disease  negative cardio ROS   Rhythm:regular Rate:Normal     Neuro/Psych PSYCHIATRIC DISORDERS negative neurological ROS  negative psych ROS   GI/Hepatic negative GI ROS, Neg liver ROS,   Endo/Other  negative endocrine ROSdiabetesMorbid obesity  Renal/GU      Musculoskeletal   Abdominal   Peds  Hematology negative hematology ROS (+)   Anesthesia Other Findings   Reproductive/Obstetrics negative OB ROS                             Anesthesia Physical Anesthesia Plan  ASA: III  Anesthesia Plan: MAC   Post-op Pain Management:    Induction:   Airway Management Planned: Nasal Cannula  Additional Equipment:   Intra-op Plan:   Post-operative Plan:   Informed Consent: I have reviewed the patients History and Physical, chart, labs and discussed the procedure including the risks, benefits and alternatives for the proposed anesthesia with the patient or authorized representative who has indicated his/her understanding and acceptance.     Plan Discussed with: CRNA  Anesthesia Plan Comments:         Anesthesia Quick Evaluation

## 2016-02-10 NOTE — Op Note (Signed)
Operative note   Surgeon:Emalene Welte Lawyer: None    Preop diagnosis: 1. Gangrene right third toe  2. Ulcer right medial ankle    Postop diagnosis: Same    Procedure: 1. Amputation right third  2. Debridement superficial right medial ankle ulceration    EBL: Minimal    Anesthesia:local and IV sedation    Hemostasis: None    Specimen: Gangrenous right third toe    Complications: None    Operative indications:Alec A Cronk Jr. is an 60 y.o. that presents today for surgical intervention.  The risks/benefits/alternatives/complications have been discussed and consent has been given.    Procedure:  Patient was brought into the OR and placed on the operating table in thesupine position. After anesthesia was obtained theright lower extremity was prepped and draped in usual sterile fashion.  Attention was directed to the right second toe where 2 elliptical incisions were made dorsal and plantar proximal to the proximal finder joint. Full-thickness incision was taken down to the metatarsophalangeal joint and the toe was disarticulated at the MTP J. This was sent for pathological examination. The wound was flushed with sterile saline with a bulb syringe. Closure of the skin was performed with a nylon. A small Surgicel was placed into the wound.  Attention was then directed to the medial aspect of the right ankle where curettage debridement was performed of the superficial medial right ankle ulceration. This was taken down to bleeding skin. This was excisional type of debridement. The ulcer measured 1.5 cm in diameter.  A well compresses sterile bulky dressing was applied to the right foot and ankle.    Patient tolerated the procedure and anesthesia well.  Was transported from the OR to the PACU with all vital signs stable and vascular status intact. To be discharged per routine protocol.  Will follow up in approximately 1 week in the outpatient clinic.

## 2016-02-10 NOTE — H&P (Signed)
HISTORY AND PHYSICAL INTERVAL NOTE:  02/10/2016  12:16 PM  Alec A Arther Jr.  has presented today for surgery, with the diagnosis of gangrene 3rd toe.  The various methods of treatment have been discussed with the patient.  No guarantees were given.  After consideration of risks, benefits and other options for treatment, the patient has consented to surgery.  I have reviewed the patients' chart and labs.    No data found.   A history and physical examination was performed in my office.  The patient was reexamined.  There have been no changes to this history and physical examination.  [name]

## 2016-02-10 NOTE — Progress Notes (Signed)
Can move right great toe and blanches poor and can not feel   Dr Vickki Muff aware  Pt has poor circulation

## 2016-02-10 NOTE — Discharge Instructions (Addendum)
[facility] °[facility] ° °POST OPERATIVE INSTRUCTIONS FOR DR. TROXLER AND DR. FOWLER °[facility] ° ° °1. Take your medication as prescribed.  Pain medication should be taken only as needed. ° °2. Keep the dressing clean, dry and intact. ° °3. Keep your foot elevated above the heart level for the first 48 hours. ° °4. Walking to the bathroom and brief periods of walking are acceptable, unless we have instructed you to be non-weight bearing. ° °5. Always wear your post-op shoe when walking.  Always use your crutches if you are to be non-weight bearing. ° °6. Do not take a shower. Baths are permissible as long as the foot is kept out of the water.  ° °7. Every hour you are awake:  °- Bend your knee 15 times. °- Flex foot 15 times °- Massage calf 15 times ° °8. Call [facility] ([phone]) if any of the following problems occur: °- You develop a temperature or fever. °- The bandage becomes saturated with blood. °- Medication does not stop your pain. °- Injury of the foot occurs. °- Any symptoms of infection including redness, odor, or red streaks running from wound. ° ° ° ° °AMBULATORY SURGERY  °DISCHARGE INSTRUCTIONS ° ° °1) The drugs that you were given will stay in your system until tomorrow so for the next 24 hours you should not: ° °A) Drive an automobile °B) Make any legal decisions °C) Drink any alcoholic beverage ° ° °2) You may resume regular meals tomorrow.  Today it is better to start with liquids and gradually work up to solid foods. ° °You may eat anything you prefer, but it is better to start with liquids, then soup and crackers, and gradually work up to solid foods. ° ° °3) Please notify your doctor immediately if you have any unusual bleeding, trouble breathing, redness and pain at the surgery site, drainage, fever, or pain not relieved by medication. ° ° ° °4) Additional Instructions: ° ° ° ° ° ° ° °Please contact your physician with any  problems or Same Day Surgery at [phone], Monday through Friday 6 am to 4 pm, or [place] [facility] number at [phone]. ° °

## 2016-02-13 ENCOUNTER — Other Ambulatory Visit (INDEPENDENT_AMBULATORY_CARE_PROVIDER_SITE_OTHER): Payer: Self-pay | Admitting: Vascular Surgery

## 2016-02-13 ENCOUNTER — Ambulatory Visit (INDEPENDENT_AMBULATORY_CARE_PROVIDER_SITE_OTHER): Payer: PPO

## 2016-02-13 ENCOUNTER — Encounter: Payer: Self-pay | Admitting: Podiatry

## 2016-02-13 ENCOUNTER — Ambulatory Visit (INDEPENDENT_AMBULATORY_CARE_PROVIDER_SITE_OTHER): Payer: PPO | Admitting: Vascular Surgery

## 2016-02-13 VITALS — BP 150/83 | HR 68 | Resp 17 | Ht 76.0 in | Wt >= 6400 oz

## 2016-02-13 DIAGNOSIS — E669 Obesity, unspecified: Secondary | ICD-10-CM | POA: Diagnosis not present

## 2016-02-13 DIAGNOSIS — I739 Peripheral vascular disease, unspecified: Secondary | ICD-10-CM

## 2016-02-13 DIAGNOSIS — E1169 Type 2 diabetes mellitus with other specified complication: Secondary | ICD-10-CM | POA: Diagnosis not present

## 2016-02-13 DIAGNOSIS — I10 Essential (primary) hypertension: Secondary | ICD-10-CM | POA: Diagnosis not present

## 2016-02-13 DIAGNOSIS — I70213 Atherosclerosis of native arteries of extremities with intermittent claudication, bilateral legs: Secondary | ICD-10-CM

## 2016-02-13 DIAGNOSIS — M79609 Pain in unspecified limb: Secondary | ICD-10-CM

## 2016-02-13 DIAGNOSIS — M79604 Pain in right leg: Secondary | ICD-10-CM

## 2016-02-13 NOTE — Progress Notes (Signed)
Subjective:    Patient ID: [name]., male    DOB: 1955/06/09, 60 y.o.   MRN: [id number] Chief Complaint  Patient presents with  . Follow-up   Patient presents for his first post-procedure follow up. He is s/p a LLE angiogram on 01/24/16. He underwent an amputation of his right third toe by Dr. Vickki Muff on 10/13. He is without complaint. States an improvement in his RLE symptoms - states they are resolved. Amputation site seems to be healing well. The patient underwent an ABI which showed Right ABI: 1.02 and Left 0.96, great toe pressures and PPG waveforms are within normal limits (on 09/08/15, Right ABI: 1.05 and Left 1.07). The patient denies any claudication like symptoms, rest pain or new / worsening ulcers to his feet / toes.     Review of Systems  Constitutional: Negative.   HENT: Negative.   Eyes: Negative.   Respiratory: Negative.   Cardiovascular: Positive for leg swelling (Bilateral Lower Extremity).  Gastrointestinal: Negative.   Endocrine: Negative.   Genitourinary: Negative.   Musculoskeletal: Negative.   Skin: Positive for wound (Right Foot Amputation Site).  Allergic/Immunologic: Negative.   Neurological: Negative.   Hematological: Negative.   Psychiatric/Behavioral: Negative.       Objective:   Physical Exam  Constitutional: He appears well-developed and well-nourished.  HENT:  Head: Normocephalic and atraumatic.  Eyes: Conjunctivae and EOM are normal. Pupils are equal, round, and reactive to light.  Neck: Normal range of motion.  Cardiovascular: Normal rate, regular rhythm, normal heart sounds and intact distal pulses.   Pulses:      Popliteal pulses are 2+ on the right side, and 2+ on the left side.       Dorsalis pedis pulses are 2+ on the right side, and 2+ on the left side.       Posterior tibial pulses are 2+ on the right side, and 2+ on the left side.  Pulmonary/Chest: Effort normal and breath sounds normal.  Abdominal: Soft. Bowel sounds are  normal.  Musculoskeletal: Normal range of motion.  Neurological: He is alert.  Skin:  Right Foot: Dressed in wound dressing. Did not remove.   Psychiatric: He has a normal mood and affect. His behavior is normal. Judgment and thought content normal.   BP (!) 150/83   Pulse 68   Resp 17   Ht 6\' 4"  (1.93 m)   Wt (!) 403 lb (182.8 kg)   BMI 49.05 kg/m   Past Medical History:  Diagnosis Date  . Asthma   . Coronary artery disease   . Depression   . Diabetes mellitus without complication ([place])   . Gout   . History anabolic steroid use   . Hyperlipidemia   . Hypertension   . Hypogonadism in male   . Morbid obesity ([place])   . Myocardial infarction   . Peripheral vascular disease ([place])   . Perirectal abscess   . Pleurisy   . Sleep apnea   . Varicella    Social History   Social History  . Marital status: Widowed    Spouse name: [name]  . Number of children: [name]  . Years of education: [name]   Occupational History  . Not on file.   Social History Main Topics  . Smoking status: Current Every Day Smoker    Packs/day: 0.50    Years: 45.00    Types: Cigarettes  . Smokeless tobacco: Never Used  . Alcohol use 1.8 oz/week    3 Glasses  of wine per week  . Drug use: No  . Sexual activity: Not on file   Other Topics Concern  . Not on file   Social History Narrative  . No narrative on file   Past Surgical History:  Procedure Laterality Date  . ABDOMINAL AORTIC ANEURYSM REPAIR    . AMPUTATION TOE Right 02/10/2016   Procedure: AMPUTATION TOE 3RD TOE;  Surgeon: [name], DPM;  Location: [facility] ORS;  Service: Podiatry;  Laterality: Right;  . COLONOSCOPY WITH PROPOFOL [name] 11/18/2015   Procedure: COLONOSCOPY WITH PROPOFOL;  Surgeon: Alec Silvas, MD;  Location: [facility] ENDOSCOPY;  Service: Endoscopy;  Laterality: [name];  . CORONARY ARTERY BYPASS GRAFT    . KNEE ARTHROSCOPY    . PERIPHERAL VASCULAR CATHETERIZATION Right 01/24/2016   Procedure: Lower Extremity Angiography;   Surgeon: Alec Cabal, MD;  Location: [facility];  Service: Cardiovascular;  Laterality: Right;  . PERIPHERAL VASCULAR CATHETERIZATION Right 01/25/2016   Procedure: Lower Extremity Angiography;  Surgeon: Alec Cabal, MD;  Location: [facility];  Service: Cardiovascular;  Laterality: Right;  . TOE AMPUTATION    . TONSILLECTOMY     No family history on file.  No Known Allergies     Assessment & Plan:  Patient presents for his first post-procedure follow up. He is s/p a LLE angiogram on 01/24/16. He underwent an amputation of his right third toe by Dr. Vickki Muff on 10/13. He is without complaint. States an improvement in his RLE symptoms - states they are resolved. Amputation site seems to be healing well. The patient underwent an ABI which showed Right ABI: 1.02 and Left 0.96, great toe pressures and PPG waveforms are within normal limits (on 09/08/15, Right ABI: 1.05 and Left 1.07). The patient denies any claudication like symptoms, rest pain or new / worsening ulcers to his feet / toes.   1. Diabetes mellitus type 2 in obese ([name]) - Stable Encouraged good control as its slows the progression of atherosclerotic disease  2. Essential hypertension, benign - Stab;e Encouraged good control as its slows the progression of atherosclerotic disease  3. PAD (peripheral artery disease) ([place]) - Improvement States an improvement in his RLE symptoms - states they are resolved. Amputation site seems to be healing well. The patient underwent an ABI which was normal. Physical exam improved. No indication for intervention at this time.   Current Outpatient Prescriptions on File Prior to Visit  Medication Sig Dispense Refill  . albuterol (PROVENTIL) (2.5 MG/3ML) 0.083% nebulizer solution Take 2.5 mg by nebulization every 6 (six) hours as needed for wheezing or shortness of breath.    Marland Kitchen albuterol (VENTOLIN HFA) 108 (90 Base) MCG/ACT inhaler Inhale into the lungs every 6 (six) hours as  needed for wheezing or shortness of breath.    Marland Kitchen albuterol-ipratropium (COMBIVENT) 18-103 MCG/ACT inhaler Inhale into the lungs every 4 (four) hours.    Marland Kitchen apixaban (ELIQUIS) 5 MG TABS tablet Take 1 tablet (5 mg total) by mouth 2 (two) times daily. 60 tablet 5  . aspirin EC 81 MG tablet Take 81 mg by mouth daily.    . Biotin 10 MG CAPS Take by mouth.    . furosemide (LASIX) 20 MG tablet Take 20 mg by mouth daily as needed.     . insulin NPH Human (HUMULIN N,NOVOLIN N) 100 UNIT/ML injection Inject 15 Units into the skin 3 (three) times daily. At 8:30 am, 2 pm, and 7 pm    . insulin NPH Human (  HUMULIN N,NOVOLIN N) 100 UNIT/ML injection Inject 10-40 Units into the skin at bedtime. 11:00pm    . insulin regular (NOVOLIN R,HUMULIN R) 100 units/mL injection Inject 15 Units into the skin 3 (three) times daily before meals. At 8:30am, 2pm and 7pm    . ipratropium-albuterol (DUONEB) 0.5-2.5 (3) MG/3ML SOLN Take 3 mLs by nebulization every 6 (six) hours as needed.    . isosorbide mononitrate (IMDUR) 30 MG 24 hr tablet Take 30 mg by mouth daily.    Marland Kitchen losartan (COZAAR) 100 MG tablet Take 100 mg by mouth daily.    . metformin (FORTAMET) 1000 MG (OSM) 24 hr tablet Take 1,000 mg by mouth 2 (two) times daily with a meal.     . metoprolol (LOPRESSOR) 50 MG tablet Take 50 mg by mouth 2 (two) times daily.    Marland Kitchen oxyCODONE (OXY IR/ROXICODONE) 5 MG immediate release tablet One to Two Tabs by Mouth Every Four to Six Hours As Needed for Pain 45 tablet 0  . oxyCODONE-acetaminophen (ROXICET) 5-325 MG tablet Take 1-2 tablets by mouth every 4 (four) hours as needed for severe pain. 30 tablet 0  . tamsulosin (FLOMAX) 0.4 MG CAPS capsule Take 0.4 mg by mouth daily.    . vitamin B-12 (CYANOCOBALAMIN) 1000 MCG tablet Take 10,000 mcg by mouth daily.     Current Facility-Administered Medications on File Prior to Visit  Medication Dose Route Frequency Provider Last Rate Last Dose  . lidocaine (XYLOCAINE) 2 % injection   Other  Anesthesia Intra-op Letitia Neri, CRNA   60 mg at 02/10/16 1251  . midazolam (VERSED) 5 MG/5ML injection   Intravenous Anesthesia Intra-op Letitia Neri, CRNA   2 mg at 02/10/16 1248  . propofol (DIPRIVAN) 10 mg/mL bolus/IV push    Anesthesia Intra-op Letitia Neri, CRNA   30 mg at 02/10/16 1312   There are no Patient Instructions on file for this visit. Return in about 3 months (around 05/15/2016) for ABI and RLE Arterial Duplex for Three Month Post Procedure Follow Up.   Turon Kilmer A Neeti Knudtson, PA-C

## 2016-02-14 DIAGNOSIS — E1121 Type 2 diabetes mellitus with diabetic nephropathy: Secondary | ICD-10-CM | POA: Diagnosis not present

## 2016-02-14 DIAGNOSIS — E538 Deficiency of other specified B group vitamins: Secondary | ICD-10-CM | POA: Diagnosis not present

## 2016-02-14 DIAGNOSIS — Z794 Long term (current) use of insulin: Secondary | ICD-10-CM | POA: Diagnosis not present

## 2016-02-14 DIAGNOSIS — E8881 Metabolic syndrome: Secondary | ICD-10-CM | POA: Diagnosis not present

## 2016-02-14 DIAGNOSIS — E785 Hyperlipidemia, unspecified: Secondary | ICD-10-CM | POA: Diagnosis not present

## 2016-02-14 DIAGNOSIS — E1165 Type 2 diabetes mellitus with hyperglycemia: Secondary | ICD-10-CM | POA: Diagnosis not present

## 2016-02-14 LAB — SURGICAL PATHOLOGY

## 2016-02-15 DIAGNOSIS — M79671 Pain in right foot: Secondary | ICD-10-CM | POA: Diagnosis not present

## 2016-02-15 DIAGNOSIS — Z794 Long term (current) use of insulin: Secondary | ICD-10-CM | POA: Diagnosis not present

## 2016-02-15 DIAGNOSIS — E1165 Type 2 diabetes mellitus with hyperglycemia: Secondary | ICD-10-CM | POA: Diagnosis not present

## 2016-02-23 DIAGNOSIS — E662 Morbid (severe) obesity with alveolar hypoventilation: Secondary | ICD-10-CM | POA: Diagnosis not present

## 2016-02-23 DIAGNOSIS — I25810 Atherosclerosis of coronary artery bypass graft(s) without angina pectoris: Secondary | ICD-10-CM | POA: Diagnosis not present

## 2016-02-23 DIAGNOSIS — E7800 Pure hypercholesterolemia, unspecified: Secondary | ICD-10-CM | POA: Diagnosis not present

## 2016-02-23 DIAGNOSIS — I10 Essential (primary) hypertension: Secondary | ICD-10-CM | POA: Diagnosis not present

## 2016-02-28 ENCOUNTER — Telehealth (INDEPENDENT_AMBULATORY_CARE_PROVIDER_SITE_OTHER): Payer: Self-pay

## 2016-02-28 NOTE — Telephone Encounter (Signed)
Patient called stating he was taking Plavix and was changed to Eliquis, he stated he started having pain in his stomach and when he had hard bowel movement he saw it was bloody. He has been bleeding since then but it has lighten up what to do?

## 2016-02-28 NOTE — Telephone Encounter (Signed)
Called the patient and let him know what the P.A. Hezzie Bump advised not taking his evening dose of Eliquis and restarting in the morning but if bleeding worsen to call back to the office. Patient was okay with that and stated he felt because he has ulcers and the pain felt like when his ulcers are bothering him, so he has taken something for that as well. See previous notes.

## 2016-02-28 NOTE — Telephone Encounter (Signed)
Patient can skip one dose of Eliquis this evening and restart in the morning. Most likely irritated his hemorrhoids. If bleeding should worsen please have the patient call the office.

## 2016-02-29 DIAGNOSIS — L97519 Non-pressure chronic ulcer of other part of right foot with unspecified severity: Secondary | ICD-10-CM | POA: Diagnosis not present

## 2016-02-29 DIAGNOSIS — L97321 Non-pressure chronic ulcer of left ankle limited to breakdown of skin: Secondary | ICD-10-CM | POA: Diagnosis not present

## 2016-03-13 ENCOUNTER — Other Ambulatory Visit (INDEPENDENT_AMBULATORY_CARE_PROVIDER_SITE_OTHER): Payer: Self-pay | Admitting: Vascular Surgery

## 2016-03-13 DIAGNOSIS — I714 Abdominal aortic aneurysm, without rupture, unspecified: Secondary | ICD-10-CM

## 2016-03-19 ENCOUNTER — Other Ambulatory Visit (INDEPENDENT_AMBULATORY_CARE_PROVIDER_SITE_OTHER): Payer: Self-pay

## 2016-03-19 ENCOUNTER — Encounter (INDEPENDENT_AMBULATORY_CARE_PROVIDER_SITE_OTHER): Payer: Self-pay

## 2016-03-19 ENCOUNTER — Ambulatory Visit (INDEPENDENT_AMBULATORY_CARE_PROVIDER_SITE_OTHER): Payer: PPO | Admitting: Vascular Surgery

## 2016-03-28 DIAGNOSIS — L97321 Non-pressure chronic ulcer of left ankle limited to breakdown of skin: Secondary | ICD-10-CM | POA: Diagnosis not present

## 2016-03-28 DIAGNOSIS — I83019 Varicose veins of right lower extremity with ulcer of unspecified site: Secondary | ICD-10-CM | POA: Diagnosis not present

## 2016-03-28 DIAGNOSIS — E1142 Type 2 diabetes mellitus with diabetic polyneuropathy: Secondary | ICD-10-CM | POA: Diagnosis not present

## 2016-03-29 ENCOUNTER — Encounter: Payer: Self-pay | Admitting: Podiatry

## 2016-03-29 NOTE — Anesthesia Postprocedure Evaluation (Signed)
Anesthesia Post Note  Patient: [name].  Procedure(s) Performed: Procedure(s) (LRB): AMPUTATION TOE 3RD TOE (Right)  Patient location during evaluation: PACU Anesthesia Type: General Level of consciousness: awake and alert Pain management: pain level controlled Vital Signs Assessment: post-procedure vital signs reviewed and stable Respiratory status: spontaneous breathing, nonlabored ventilation, respiratory function stable and patient connected to nasal cannula oxygen Cardiovascular status: blood pressure returned to baseline and stable Postop Assessment: no signs of nausea or vomiting Anesthetic complications: no    Last Vitals:  Vitals:   02/10/16 1421 02/10/16 1444  BP: (!) 166/86 (!) 176/98  Pulse: 63 69  Resp: 16 18  Temp: 36.2 C     Last Pain:  Vitals:   02/13/16 0812  TempSrc:   PainSc: 0-No pain                 Molli Barrows

## 2016-03-29 NOTE — Transfer of Care (Signed)
Immediate Anesthesia Transfer of Care Note  Patient: [name].  Procedure(s) Performed: Procedure(s): AMPUTATION TOE 3RD TOE (Right)  Patient Location: PACU  Anesthesia Type:General  Level of Consciousness: awake  Airway & Oxygen Therapy: Patient Spontanous Breathing  Post-op Assessment: Report given to RN  Post vital signs: stable  Last Vitals:  Vitals:   02/10/16 1421 02/10/16 1444  BP: (!) 166/86 (!) 176/98  Pulse: 63 69  Resp: 16 18  Temp: 36.2 C     Last Pain:  Vitals:   02/13/16 0812  TempSrc:   PainSc: 0-No pain         Complications: No apparent anesthesia complications

## 2016-05-02 DIAGNOSIS — B351 Tinea unguium: Secondary | ICD-10-CM | POA: Diagnosis not present

## 2016-05-02 DIAGNOSIS — L97321 Non-pressure chronic ulcer of left ankle limited to breakdown of skin: Secondary | ICD-10-CM | POA: Diagnosis not present

## 2016-05-02 DIAGNOSIS — E1142 Type 2 diabetes mellitus with diabetic polyneuropathy: Secondary | ICD-10-CM | POA: Diagnosis not present

## 2016-05-08 DIAGNOSIS — Z794 Long term (current) use of insulin: Secondary | ICD-10-CM | POA: Diagnosis not present

## 2016-05-08 DIAGNOSIS — E1165 Type 2 diabetes mellitus with hyperglycemia: Secondary | ICD-10-CM | POA: Diagnosis not present

## 2016-05-15 DIAGNOSIS — E538 Deficiency of other specified B group vitamins: Secondary | ICD-10-CM | POA: Diagnosis not present

## 2016-05-15 DIAGNOSIS — E1121 Type 2 diabetes mellitus with diabetic nephropathy: Secondary | ICD-10-CM | POA: Diagnosis not present

## 2016-05-15 DIAGNOSIS — E785 Hyperlipidemia, unspecified: Secondary | ICD-10-CM | POA: Diagnosis not present

## 2016-05-15 DIAGNOSIS — Z89421 Acquired absence of other right toe(s): Secondary | ICD-10-CM | POA: Diagnosis not present

## 2016-05-15 DIAGNOSIS — E1142 Type 2 diabetes mellitus with diabetic polyneuropathy: Secondary | ICD-10-CM | POA: Diagnosis not present

## 2016-05-15 DIAGNOSIS — E8881 Metabolic syndrome: Secondary | ICD-10-CM | POA: Diagnosis not present

## 2016-05-15 DIAGNOSIS — Z794 Long term (current) use of insulin: Secondary | ICD-10-CM | POA: Diagnosis not present

## 2016-05-21 ENCOUNTER — Encounter (INDEPENDENT_AMBULATORY_CARE_PROVIDER_SITE_OTHER): Payer: PPO

## 2016-05-21 ENCOUNTER — Ambulatory Visit (INDEPENDENT_AMBULATORY_CARE_PROVIDER_SITE_OTHER): Payer: PPO | Admitting: Vascular Surgery

## 2016-05-30 DIAGNOSIS — E662 Morbid (severe) obesity with alveolar hypoventilation: Secondary | ICD-10-CM | POA: Diagnosis not present

## 2016-05-30 DIAGNOSIS — E538 Deficiency of other specified B group vitamins: Secondary | ICD-10-CM | POA: Diagnosis not present

## 2016-05-30 DIAGNOSIS — Z79899 Other long term (current) drug therapy: Secondary | ICD-10-CM | POA: Diagnosis not present

## 2016-05-30 DIAGNOSIS — I10 Essential (primary) hypertension: Secondary | ICD-10-CM | POA: Diagnosis not present

## 2016-05-30 DIAGNOSIS — Z125 Encounter for screening for malignant neoplasm of prostate: Secondary | ICD-10-CM | POA: Diagnosis not present

## 2016-05-30 DIAGNOSIS — I739 Peripheral vascular disease, unspecified: Secondary | ICD-10-CM | POA: Diagnosis not present

## 2016-05-30 DIAGNOSIS — Z6841 Body Mass Index (BMI) 40.0 and over, adult: Secondary | ICD-10-CM | POA: Diagnosis not present

## 2016-05-30 DIAGNOSIS — E6609 Other obesity due to excess calories: Secondary | ICD-10-CM | POA: Diagnosis not present

## 2016-05-30 DIAGNOSIS — N400 Enlarged prostate without lower urinary tract symptoms: Secondary | ICD-10-CM | POA: Diagnosis not present

## 2016-05-30 DIAGNOSIS — E7800 Pure hypercholesterolemia, unspecified: Secondary | ICD-10-CM | POA: Diagnosis not present

## 2016-05-30 DIAGNOSIS — E1121 Type 2 diabetes mellitus with diabetic nephropathy: Secondary | ICD-10-CM | POA: Diagnosis not present

## 2016-05-30 DIAGNOSIS — E118 Type 2 diabetes mellitus with unspecified complications: Secondary | ICD-10-CM | POA: Diagnosis not present

## 2016-06-28 ENCOUNTER — Ambulatory Visit (INDEPENDENT_AMBULATORY_CARE_PROVIDER_SITE_OTHER): Payer: PPO

## 2016-06-28 ENCOUNTER — Ambulatory Visit (INDEPENDENT_AMBULATORY_CARE_PROVIDER_SITE_OTHER): Payer: PPO | Admitting: Vascular Surgery

## 2016-06-28 DIAGNOSIS — I739 Peripheral vascular disease, unspecified: Secondary | ICD-10-CM

## 2016-08-09 DIAGNOSIS — Z79899 Other long term (current) drug therapy: Secondary | ICD-10-CM | POA: Diagnosis not present

## 2016-08-09 DIAGNOSIS — Z125 Encounter for screening for malignant neoplasm of prostate: Secondary | ICD-10-CM | POA: Diagnosis not present

## 2016-08-09 DIAGNOSIS — I10 Essential (primary) hypertension: Secondary | ICD-10-CM | POA: Diagnosis not present

## 2016-08-09 DIAGNOSIS — E1121 Type 2 diabetes mellitus with diabetic nephropathy: Secondary | ICD-10-CM | POA: Diagnosis not present

## 2016-08-09 DIAGNOSIS — E1142 Type 2 diabetes mellitus with diabetic polyneuropathy: Secondary | ICD-10-CM | POA: Diagnosis not present

## 2016-08-09 DIAGNOSIS — Z794 Long term (current) use of insulin: Secondary | ICD-10-CM | POA: Diagnosis not present

## 2016-08-09 DIAGNOSIS — E7800 Pure hypercholesterolemia, unspecified: Secondary | ICD-10-CM | POA: Diagnosis not present

## 2016-08-15 DIAGNOSIS — I25810 Atherosclerosis of coronary artery bypass graft(s) without angina pectoris: Secondary | ICD-10-CM | POA: Diagnosis not present

## 2016-08-15 DIAGNOSIS — E7800 Pure hypercholesterolemia, unspecified: Secondary | ICD-10-CM | POA: Diagnosis not present

## 2016-08-15 DIAGNOSIS — E662 Morbid (severe) obesity with alveolar hypoventilation: Secondary | ICD-10-CM | POA: Diagnosis not present

## 2016-08-15 DIAGNOSIS — I10 Essential (primary) hypertension: Secondary | ICD-10-CM | POA: Diagnosis not present

## 2016-08-15 DIAGNOSIS — E1121 Type 2 diabetes mellitus with diabetic nephropathy: Secondary | ICD-10-CM | POA: Diagnosis not present

## 2016-08-16 DIAGNOSIS — E8881 Metabolic syndrome: Secondary | ICD-10-CM | POA: Diagnosis not present

## 2016-08-16 DIAGNOSIS — E1142 Type 2 diabetes mellitus with diabetic polyneuropathy: Secondary | ICD-10-CM | POA: Diagnosis not present

## 2016-08-16 DIAGNOSIS — Z6841 Body Mass Index (BMI) 40.0 and over, adult: Secondary | ICD-10-CM | POA: Diagnosis not present

## 2016-08-16 DIAGNOSIS — E785 Hyperlipidemia, unspecified: Secondary | ICD-10-CM | POA: Diagnosis not present

## 2016-08-16 DIAGNOSIS — Z794 Long term (current) use of insulin: Secondary | ICD-10-CM | POA: Diagnosis not present

## 2016-08-16 DIAGNOSIS — E538 Deficiency of other specified B group vitamins: Secondary | ICD-10-CM | POA: Diagnosis not present

## 2016-08-16 DIAGNOSIS — Z89421 Acquired absence of other right toe(s): Secondary | ICD-10-CM | POA: Diagnosis not present

## 2016-08-16 DIAGNOSIS — E1121 Type 2 diabetes mellitus with diabetic nephropathy: Secondary | ICD-10-CM | POA: Diagnosis not present

## 2016-08-29 DIAGNOSIS — Z794 Long term (current) use of insulin: Secondary | ICD-10-CM | POA: Diagnosis not present

## 2016-08-29 DIAGNOSIS — I10 Essential (primary) hypertension: Secondary | ICD-10-CM | POA: Diagnosis not present

## 2016-08-29 DIAGNOSIS — E662 Morbid (severe) obesity with alveolar hypoventilation: Secondary | ICD-10-CM | POA: Diagnosis not present

## 2016-08-29 DIAGNOSIS — E118 Type 2 diabetes mellitus with unspecified complications: Secondary | ICD-10-CM | POA: Diagnosis not present

## 2016-08-29 DIAGNOSIS — E7800 Pure hypercholesterolemia, unspecified: Secondary | ICD-10-CM | POA: Diagnosis not present

## 2016-11-26 ENCOUNTER — Ambulatory Visit
Admission: RE | Admit: 2016-11-26 | Discharge: 2016-11-26 | Disposition: A | Discharge status: Home / Self Care | Payer: PPO | Source: Ambulatory Visit | Attending: Physician Assistant | Admitting: Physician Assistant

## 2016-11-26 ENCOUNTER — Other Ambulatory Visit: Payer: Self-pay | Admitting: Physician Assistant

## 2016-11-26 DIAGNOSIS — M7989 Other specified soft tissue disorders: Secondary | ICD-10-CM | POA: Insufficient documentation

## 2016-11-26 DIAGNOSIS — E118 Type 2 diabetes mellitus with unspecified complications: Secondary | ICD-10-CM | POA: Diagnosis not present

## 2016-11-26 DIAGNOSIS — Z794 Long term (current) use of insulin: Secondary | ICD-10-CM | POA: Diagnosis not present

## 2016-11-26 DIAGNOSIS — I10 Essential (primary) hypertension: Secondary | ICD-10-CM | POA: Diagnosis not present

## 2016-11-26 DIAGNOSIS — R609 Edema, unspecified: Secondary | ICD-10-CM | POA: Diagnosis not present

## 2016-12-11 DIAGNOSIS — I10 Essential (primary) hypertension: Secondary | ICD-10-CM | POA: Diagnosis not present

## 2016-12-11 DIAGNOSIS — Z0000 Encounter for general adult medical examination without abnormal findings: Secondary | ICD-10-CM | POA: Diagnosis not present

## 2016-12-11 DIAGNOSIS — Z794 Long term (current) use of insulin: Secondary | ICD-10-CM | POA: Diagnosis not present

## 2016-12-11 DIAGNOSIS — E118 Type 2 diabetes mellitus with unspecified complications: Secondary | ICD-10-CM | POA: Diagnosis not present

## 2016-12-11 DIAGNOSIS — E7800 Pure hypercholesterolemia, unspecified: Secondary | ICD-10-CM | POA: Diagnosis not present

## 2016-12-11 DIAGNOSIS — Z79899 Other long term (current) drug therapy: Secondary | ICD-10-CM | POA: Diagnosis not present

## 2016-12-11 DIAGNOSIS — E1121 Type 2 diabetes mellitus with diabetic nephropathy: Secondary | ICD-10-CM | POA: Diagnosis not present

## 2016-12-11 DIAGNOSIS — E662 Morbid (severe) obesity with alveolar hypoventilation: Secondary | ICD-10-CM | POA: Diagnosis not present

## 2017-02-07 DIAGNOSIS — E118 Type 2 diabetes mellitus with unspecified complications: Secondary | ICD-10-CM | POA: Diagnosis not present

## 2017-02-07 DIAGNOSIS — Z794 Long term (current) use of insulin: Secondary | ICD-10-CM | POA: Diagnosis not present

## 2017-02-07 DIAGNOSIS — L03115 Cellulitis of right lower limb: Secondary | ICD-10-CM | POA: Diagnosis not present

## 2017-02-14 DIAGNOSIS — I10 Essential (primary) hypertension: Secondary | ICD-10-CM | POA: Diagnosis not present

## 2017-02-14 DIAGNOSIS — E7800 Pure hypercholesterolemia, unspecified: Secondary | ICD-10-CM | POA: Diagnosis not present

## 2017-02-14 DIAGNOSIS — I25810 Atherosclerosis of coronary artery bypass graft(s) without angina pectoris: Secondary | ICD-10-CM | POA: Diagnosis not present

## 2017-02-22 DIAGNOSIS — E11649 Type 2 diabetes mellitus with hypoglycemia without coma: Secondary | ICD-10-CM | POA: Diagnosis not present

## 2017-02-22 DIAGNOSIS — E1121 Type 2 diabetes mellitus with diabetic nephropathy: Secondary | ICD-10-CM | POA: Diagnosis not present

## 2017-02-22 DIAGNOSIS — Z79899 Other long term (current) drug therapy: Secondary | ICD-10-CM | POA: Diagnosis not present

## 2017-02-22 DIAGNOSIS — E538 Deficiency of other specified B group vitamins: Secondary | ICD-10-CM | POA: Diagnosis not present

## 2017-02-22 DIAGNOSIS — I10 Essential (primary) hypertension: Secondary | ICD-10-CM | POA: Diagnosis not present

## 2017-02-22 DIAGNOSIS — E785 Hyperlipidemia, unspecified: Secondary | ICD-10-CM | POA: Diagnosis not present

## 2017-02-22 DIAGNOSIS — Z6841 Body Mass Index (BMI) 40.0 and over, adult: Secondary | ICD-10-CM | POA: Diagnosis not present

## 2017-02-22 DIAGNOSIS — E8881 Metabolic syndrome: Secondary | ICD-10-CM | POA: Diagnosis not present

## 2017-02-22 DIAGNOSIS — Z89421 Acquired absence of other right toe(s): Secondary | ICD-10-CM | POA: Diagnosis not present

## 2017-02-22 DIAGNOSIS — Z794 Long term (current) use of insulin: Secondary | ICD-10-CM | POA: Diagnosis not present

## 2017-02-22 DIAGNOSIS — E7800 Pure hypercholesterolemia, unspecified: Secondary | ICD-10-CM | POA: Diagnosis not present

## 2017-02-22 DIAGNOSIS — E1142 Type 2 diabetes mellitus with diabetic polyneuropathy: Secondary | ICD-10-CM | POA: Diagnosis not present

## 2017-03-19 DIAGNOSIS — E7800 Pure hypercholesterolemia, unspecified: Secondary | ICD-10-CM | POA: Diagnosis not present

## 2017-03-19 DIAGNOSIS — I10 Essential (primary) hypertension: Secondary | ICD-10-CM | POA: Diagnosis not present

## 2017-03-19 DIAGNOSIS — E538 Deficiency of other specified B group vitamins: Secondary | ICD-10-CM | POA: Diagnosis not present

## 2017-03-19 DIAGNOSIS — E662 Morbid (severe) obesity with alveolar hypoventilation: Secondary | ICD-10-CM | POA: Diagnosis not present

## 2017-03-19 DIAGNOSIS — Z0000 Encounter for general adult medical examination without abnormal findings: Secondary | ICD-10-CM | POA: Diagnosis not present

## 2017-03-19 DIAGNOSIS — E11649 Type 2 diabetes mellitus with hypoglycemia without coma: Secondary | ICD-10-CM | POA: Diagnosis not present

## 2017-03-19 DIAGNOSIS — J449 Chronic obstructive pulmonary disease, unspecified: Secondary | ICD-10-CM | POA: Diagnosis not present

## 2017-03-19 DIAGNOSIS — J431 Panlobular emphysema: Secondary | ICD-10-CM | POA: Diagnosis not present

## 2017-05-28 DIAGNOSIS — E8881 Metabolic syndrome: Secondary | ICD-10-CM | POA: Diagnosis not present

## 2017-05-28 DIAGNOSIS — E1121 Type 2 diabetes mellitus with diabetic nephropathy: Secondary | ICD-10-CM | POA: Diagnosis not present

## 2017-05-28 DIAGNOSIS — Z794 Long term (current) use of insulin: Secondary | ICD-10-CM | POA: Diagnosis not present

## 2017-05-28 DIAGNOSIS — E1142 Type 2 diabetes mellitus with diabetic polyneuropathy: Secondary | ICD-10-CM | POA: Diagnosis not present

## 2017-05-28 DIAGNOSIS — Z6841 Body Mass Index (BMI) 40.0 and over, adult: Secondary | ICD-10-CM | POA: Diagnosis not present

## 2017-05-28 DIAGNOSIS — E538 Deficiency of other specified B group vitamins: Secondary | ICD-10-CM | POA: Diagnosis not present

## 2017-05-28 DIAGNOSIS — E785 Hyperlipidemia, unspecified: Secondary | ICD-10-CM | POA: Diagnosis not present

## 2017-05-28 DIAGNOSIS — Z89421 Acquired absence of other right toe(s): Secondary | ICD-10-CM | POA: Diagnosis not present

## 2017-06-19 DIAGNOSIS — E7800 Pure hypercholesterolemia, unspecified: Secondary | ICD-10-CM | POA: Diagnosis not present

## 2017-06-19 DIAGNOSIS — E662 Morbid (severe) obesity with alveolar hypoventilation: Secondary | ICD-10-CM | POA: Diagnosis not present

## 2017-06-19 DIAGNOSIS — Z794 Long term (current) use of insulin: Secondary | ICD-10-CM | POA: Diagnosis not present

## 2017-06-19 DIAGNOSIS — Z125 Encounter for screening for malignant neoplasm of prostate: Secondary | ICD-10-CM | POA: Diagnosis not present

## 2017-06-19 DIAGNOSIS — Z79899 Other long term (current) drug therapy: Secondary | ICD-10-CM | POA: Diagnosis not present

## 2017-06-19 DIAGNOSIS — E11649 Type 2 diabetes mellitus with hypoglycemia without coma: Secondary | ICD-10-CM | POA: Diagnosis not present

## 2017-06-19 DIAGNOSIS — E118 Type 2 diabetes mellitus with unspecified complications: Secondary | ICD-10-CM | POA: Diagnosis not present

## 2017-06-19 DIAGNOSIS — I10 Essential (primary) hypertension: Secondary | ICD-10-CM | POA: Diagnosis not present

## 2017-08-27 DIAGNOSIS — E662 Morbid (severe) obesity with alveolar hypoventilation: Secondary | ICD-10-CM | POA: Diagnosis not present

## 2017-08-27 DIAGNOSIS — I25810 Atherosclerosis of coronary artery bypass graft(s) without angina pectoris: Secondary | ICD-10-CM | POA: Diagnosis not present

## 2017-08-27 DIAGNOSIS — E1121 Type 2 diabetes mellitus with diabetic nephropathy: Secondary | ICD-10-CM | POA: Diagnosis not present

## 2017-08-27 DIAGNOSIS — I10 Essential (primary) hypertension: Secondary | ICD-10-CM | POA: Diagnosis not present

## 2017-08-27 DIAGNOSIS — E7800 Pure hypercholesterolemia, unspecified: Secondary | ICD-10-CM | POA: Diagnosis not present

## 2017-09-12 DIAGNOSIS — Z79899 Other long term (current) drug therapy: Secondary | ICD-10-CM | POA: Diagnosis not present

## 2017-09-12 DIAGNOSIS — I10 Essential (primary) hypertension: Secondary | ICD-10-CM | POA: Diagnosis not present

## 2017-09-12 DIAGNOSIS — E7800 Pure hypercholesterolemia, unspecified: Secondary | ICD-10-CM | POA: Diagnosis not present

## 2017-09-12 DIAGNOSIS — Z794 Long term (current) use of insulin: Secondary | ICD-10-CM | POA: Diagnosis not present

## 2017-09-12 DIAGNOSIS — E118 Type 2 diabetes mellitus with unspecified complications: Secondary | ICD-10-CM | POA: Diagnosis not present

## 2017-09-12 DIAGNOSIS — Z125 Encounter for screening for malignant neoplasm of prostate: Secondary | ICD-10-CM | POA: Diagnosis not present

## 2017-09-19 DIAGNOSIS — Z0000 Encounter for general adult medical examination without abnormal findings: Secondary | ICD-10-CM | POA: Diagnosis not present

## 2017-09-19 DIAGNOSIS — E7800 Pure hypercholesterolemia, unspecified: Secondary | ICD-10-CM | POA: Diagnosis not present

## 2017-09-19 DIAGNOSIS — E662 Morbid (severe) obesity with alveolar hypoventilation: Secondary | ICD-10-CM | POA: Diagnosis not present

## 2017-09-19 DIAGNOSIS — I10 Essential (primary) hypertension: Secondary | ICD-10-CM | POA: Diagnosis not present

## 2017-09-19 DIAGNOSIS — Z794 Long term (current) use of insulin: Secondary | ICD-10-CM | POA: Diagnosis not present

## 2017-09-19 DIAGNOSIS — E118 Type 2 diabetes mellitus with unspecified complications: Secondary | ICD-10-CM | POA: Diagnosis not present

## 2017-09-19 DIAGNOSIS — E1121 Type 2 diabetes mellitus with diabetic nephropathy: Secondary | ICD-10-CM | POA: Diagnosis not present

## 2017-09-30 DIAGNOSIS — J301 Allergic rhinitis due to pollen: Secondary | ICD-10-CM | POA: Diagnosis not present

## 2017-09-30 DIAGNOSIS — H9202 Otalgia, left ear: Secondary | ICD-10-CM | POA: Diagnosis not present

## 2017-12-12 DIAGNOSIS — E1121 Type 2 diabetes mellitus with diabetic nephropathy: Secondary | ICD-10-CM | POA: Diagnosis not present

## 2017-12-16 DIAGNOSIS — Z79899 Other long term (current) drug therapy: Secondary | ICD-10-CM | POA: Diagnosis not present

## 2017-12-16 DIAGNOSIS — E7800 Pure hypercholesterolemia, unspecified: Secondary | ICD-10-CM | POA: Diagnosis not present

## 2017-12-16 DIAGNOSIS — E1121 Type 2 diabetes mellitus with diabetic nephropathy: Secondary | ICD-10-CM | POA: Diagnosis not present

## 2017-12-16 DIAGNOSIS — I10 Essential (primary) hypertension: Secondary | ICD-10-CM | POA: Diagnosis not present

## 2018-01-03 ENCOUNTER — Other Ambulatory Visit: Payer: Self-pay | Admitting: Pharmacist

## 2018-01-03 NOTE — Patient Outreach (Signed)
[facility]) Care Management  01/03/2018  Jaquil Todt. 1955-07-20 [id number]  Subjective  Incoming call from [name] in response to the EMMI Medication Adherence Campaign. Speak with patient. HIPAA identifiers verified and verbal consent received.  Patient reports that he takes metformin 500 mg twice daily. Reports that his dose has been decreased  because he lost a significant amount of weight. Reports that he wears a [employer] and checks his readings throughout the day. Patient reports that he also uses Regular Insulin that he purchases over the counter as directed by his PCP on a sliding scale if needed based on his blood sugar readings. Patient reports that his most recent A1C was 6.8%. However, perform Epic chart review and note that patient's A1C was 6.8% on 05/28/17, but then 7.5% on 09/12/17 and 7.7% on 12/12/17. Counsel patient on the importance of medication adherence and blood sugar control. Mr. Alligood states that he knows his body best and how to manage it.  Mr. Strey denies any medication questions/concerns at this time.  Objective  A1C 7.7% (12/12/17) 7.5% (09/12/17) 6.8% (05/28/17)  eGFR >60 mL/min (09/12/17)   Assessment   Patient with gradually increasing A1C from 6.8% to 7.7% since January not currently on target dose of metformin, but reporting using sliding scale regular insulin.   PLAN  1) Will forward this note and a letter to patient's PCP to recommend that provider consider titrating up patient's metformin dose to metformin ER 1000 mg twice daily for improved blood glucose control. Will also request that provider review patient's use of his regular insulin to ensure that he is using it properly to avoid potential hypoglycemic episodes.  2) Will close pharmacy episode.  Harlow Asa, PharmD, [facility] [phone]

## 2018-03-03 DIAGNOSIS — I10 Essential (primary) hypertension: Secondary | ICD-10-CM | POA: Diagnosis not present

## 2018-03-03 DIAGNOSIS — E1121 Type 2 diabetes mellitus with diabetic nephropathy: Secondary | ICD-10-CM | POA: Diagnosis not present

## 2018-03-03 DIAGNOSIS — E7800 Pure hypercholesterolemia, unspecified: Secondary | ICD-10-CM | POA: Diagnosis not present

## 2018-03-03 DIAGNOSIS — I25810 Atherosclerosis of coronary artery bypass graft(s) without angina pectoris: Secondary | ICD-10-CM | POA: Diagnosis not present

## 2018-03-03 DIAGNOSIS — Z6841 Body Mass Index (BMI) 40.0 and over, adult: Secondary | ICD-10-CM | POA: Diagnosis not present

## 2018-03-13 DIAGNOSIS — E7800 Pure hypercholesterolemia, unspecified: Secondary | ICD-10-CM | POA: Diagnosis not present

## 2018-03-13 DIAGNOSIS — Z0189 Encounter for other specified special examinations: Secondary | ICD-10-CM | POA: Diagnosis not present

## 2018-03-13 DIAGNOSIS — Z79899 Other long term (current) drug therapy: Secondary | ICD-10-CM | POA: Diagnosis not present

## 2018-03-13 DIAGNOSIS — I10 Essential (primary) hypertension: Secondary | ICD-10-CM | POA: Diagnosis not present

## 2018-03-13 DIAGNOSIS — E1121 Type 2 diabetes mellitus with diabetic nephropathy: Secondary | ICD-10-CM | POA: Diagnosis not present

## 2018-03-15 DIAGNOSIS — J069 Acute upper respiratory infection, unspecified: Secondary | ICD-10-CM | POA: Diagnosis not present

## 2018-03-18 DIAGNOSIS — E1121 Type 2 diabetes mellitus with diabetic nephropathy: Secondary | ICD-10-CM | POA: Diagnosis not present

## 2018-03-18 DIAGNOSIS — E7800 Pure hypercholesterolemia, unspecified: Secondary | ICD-10-CM | POA: Diagnosis not present

## 2018-03-18 DIAGNOSIS — I25810 Atherosclerosis of coronary artery bypass graft(s) without angina pectoris: Secondary | ICD-10-CM | POA: Diagnosis not present

## 2018-03-18 DIAGNOSIS — J301 Allergic rhinitis due to pollen: Secondary | ICD-10-CM | POA: Diagnosis not present

## 2018-03-18 DIAGNOSIS — I10 Essential (primary) hypertension: Secondary | ICD-10-CM | POA: Diagnosis not present

## 2018-06-18 DIAGNOSIS — I10 Essential (primary) hypertension: Secondary | ICD-10-CM | POA: Diagnosis not present

## 2018-06-18 DIAGNOSIS — Z0000 Encounter for general adult medical examination without abnormal findings: Secondary | ICD-10-CM | POA: Diagnosis not present

## 2018-06-18 DIAGNOSIS — E1121 Type 2 diabetes mellitus with diabetic nephropathy: Secondary | ICD-10-CM | POA: Diagnosis not present

## 2018-06-18 DIAGNOSIS — Z79899 Other long term (current) drug therapy: Secondary | ICD-10-CM | POA: Diagnosis not present

## 2018-06-18 DIAGNOSIS — E7800 Pure hypercholesterolemia, unspecified: Secondary | ICD-10-CM | POA: Diagnosis not present

## 2018-06-18 DIAGNOSIS — Z125 Encounter for screening for malignant neoplasm of prostate: Secondary | ICD-10-CM | POA: Diagnosis not present

## 2018-06-18 DIAGNOSIS — Z6836 Body mass index (BMI) 36.0-36.9, adult: Secondary | ICD-10-CM | POA: Diagnosis not present

## 2018-06-18 DIAGNOSIS — I25810 Atherosclerosis of coronary artery bypass graft(s) without angina pectoris: Secondary | ICD-10-CM | POA: Diagnosis not present

## 2018-09-03 DIAGNOSIS — I25810 Atherosclerosis of coronary artery bypass graft(s) without angina pectoris: Secondary | ICD-10-CM | POA: Diagnosis not present

## 2018-09-30 DIAGNOSIS — Z79899 Other long term (current) drug therapy: Secondary | ICD-10-CM | POA: Diagnosis not present

## 2018-09-30 DIAGNOSIS — Z125 Encounter for screening for malignant neoplasm of prostate: Secondary | ICD-10-CM | POA: Diagnosis not present

## 2018-09-30 DIAGNOSIS — I10 Essential (primary) hypertension: Secondary | ICD-10-CM | POA: Diagnosis not present

## 2018-09-30 DIAGNOSIS — E7800 Pure hypercholesterolemia, unspecified: Secondary | ICD-10-CM | POA: Diagnosis not present

## 2018-09-30 DIAGNOSIS — E1121 Type 2 diabetes mellitus with diabetic nephropathy: Secondary | ICD-10-CM | POA: Diagnosis not present

## 2018-10-07 DIAGNOSIS — I10 Essential (primary) hypertension: Secondary | ICD-10-CM | POA: Diagnosis not present

## 2018-10-07 DIAGNOSIS — E7800 Pure hypercholesterolemia, unspecified: Secondary | ICD-10-CM | POA: Diagnosis not present

## 2018-10-07 DIAGNOSIS — Z79899 Other long term (current) drug therapy: Secondary | ICD-10-CM | POA: Diagnosis not present

## 2018-10-07 DIAGNOSIS — Z0000 Encounter for general adult medical examination without abnormal findings: Secondary | ICD-10-CM | POA: Diagnosis not present

## 2018-10-07 DIAGNOSIS — E538 Deficiency of other specified B group vitamins: Secondary | ICD-10-CM | POA: Diagnosis not present

## 2018-10-07 DIAGNOSIS — E1121 Type 2 diabetes mellitus with diabetic nephropathy: Secondary | ICD-10-CM | POA: Diagnosis not present

## 2018-10-07 DIAGNOSIS — I25810 Atherosclerosis of coronary artery bypass graft(s) without angina pectoris: Secondary | ICD-10-CM | POA: Diagnosis not present

## 2018-10-07 DIAGNOSIS — K635 Polyp of colon: Secondary | ICD-10-CM | POA: Diagnosis not present

## 2018-10-07 DIAGNOSIS — E118 Type 2 diabetes mellitus with unspecified complications: Secondary | ICD-10-CM | POA: Diagnosis not present

## 2019-02-03 DIAGNOSIS — Z79899 Other long term (current) drug therapy: Secondary | ICD-10-CM | POA: Diagnosis not present

## 2019-02-03 DIAGNOSIS — E7800 Pure hypercholesterolemia, unspecified: Secondary | ICD-10-CM | POA: Diagnosis not present

## 2019-02-03 DIAGNOSIS — E1121 Type 2 diabetes mellitus with diabetic nephropathy: Secondary | ICD-10-CM | POA: Diagnosis not present

## 2019-02-03 DIAGNOSIS — I10 Essential (primary) hypertension: Secondary | ICD-10-CM | POA: Diagnosis not present

## 2019-02-13 DIAGNOSIS — Z23 Encounter for immunization: Secondary | ICD-10-CM | POA: Diagnosis not present

## 2019-02-13 DIAGNOSIS — E118 Type 2 diabetes mellitus with unspecified complications: Secondary | ICD-10-CM | POA: Diagnosis not present

## 2019-02-13 DIAGNOSIS — M533 Sacrococcygeal disorders, not elsewhere classified: Secondary | ICD-10-CM | POA: Diagnosis not present

## 2019-02-13 DIAGNOSIS — Z79899 Other long term (current) drug therapy: Secondary | ICD-10-CM | POA: Diagnosis not present

## 2019-02-13 DIAGNOSIS — I10 Essential (primary) hypertension: Secondary | ICD-10-CM | POA: Diagnosis not present

## 2019-02-13 DIAGNOSIS — E11649 Type 2 diabetes mellitus with hypoglycemia without coma: Secondary | ICD-10-CM | POA: Diagnosis not present

## 2019-02-13 DIAGNOSIS — E1121 Type 2 diabetes mellitus with diabetic nephropathy: Secondary | ICD-10-CM | POA: Diagnosis not present

## 2019-02-13 DIAGNOSIS — G4733 Obstructive sleep apnea (adult) (pediatric): Secondary | ICD-10-CM | POA: Diagnosis not present

## 2019-02-13 DIAGNOSIS — E7800 Pure hypercholesterolemia, unspecified: Secondary | ICD-10-CM | POA: Diagnosis not present

## 2019-03-03 DIAGNOSIS — E7800 Pure hypercholesterolemia, unspecified: Secondary | ICD-10-CM | POA: Diagnosis not present

## 2019-03-03 DIAGNOSIS — I25810 Atherosclerosis of coronary artery bypass graft(s) without angina pectoris: Secondary | ICD-10-CM | POA: Diagnosis not present

## 2019-03-03 DIAGNOSIS — E118 Type 2 diabetes mellitus with unspecified complications: Secondary | ICD-10-CM | POA: Diagnosis not present

## 2019-03-03 DIAGNOSIS — I739 Peripheral vascular disease, unspecified: Secondary | ICD-10-CM | POA: Diagnosis not present

## 2019-03-03 DIAGNOSIS — Z6836 Body mass index (BMI) 36.0-36.9, adult: Secondary | ICD-10-CM | POA: Diagnosis not present

## 2019-03-03 DIAGNOSIS — I10 Essential (primary) hypertension: Secondary | ICD-10-CM | POA: Diagnosis not present

## 2019-04-28 DIAGNOSIS — Z6836 Body mass index (BMI) 36.0-36.9, adult: Secondary | ICD-10-CM | POA: Diagnosis not present

## 2019-04-28 DIAGNOSIS — R079 Chest pain, unspecified: Secondary | ICD-10-CM | POA: Diagnosis not present

## 2019-04-28 DIAGNOSIS — I25810 Atherosclerosis of coronary artery bypass graft(s) without angina pectoris: Secondary | ICD-10-CM | POA: Diagnosis not present

## 2019-04-28 DIAGNOSIS — I10 Essential (primary) hypertension: Secondary | ICD-10-CM | POA: Diagnosis not present

## 2019-04-28 DIAGNOSIS — E118 Type 2 diabetes mellitus with unspecified complications: Secondary | ICD-10-CM | POA: Diagnosis not present

## 2019-04-28 DIAGNOSIS — I739 Peripheral vascular disease, unspecified: Secondary | ICD-10-CM | POA: Diagnosis not present

## 2019-05-04 DIAGNOSIS — G4733 Obstructive sleep apnea (adult) (pediatric): Secondary | ICD-10-CM | POA: Diagnosis not present

## 2019-05-07 DIAGNOSIS — G4733 Obstructive sleep apnea (adult) (pediatric): Secondary | ICD-10-CM | POA: Diagnosis not present

## 2019-05-08 DIAGNOSIS — E118 Type 2 diabetes mellitus with unspecified complications: Secondary | ICD-10-CM | POA: Diagnosis not present

## 2019-05-08 DIAGNOSIS — Z79899 Other long term (current) drug therapy: Secondary | ICD-10-CM | POA: Diagnosis not present

## 2019-05-15 DIAGNOSIS — Z79899 Other long term (current) drug therapy: Secondary | ICD-10-CM | POA: Diagnosis not present

## 2019-05-15 DIAGNOSIS — G720 Drug-induced myopathy: Secondary | ICD-10-CM | POA: Insufficient documentation

## 2019-05-15 DIAGNOSIS — G2581 Restless legs syndrome: Secondary | ICD-10-CM | POA: Insufficient documentation

## 2019-05-15 DIAGNOSIS — I25810 Atherosclerosis of coronary artery bypass graft(s) without angina pectoris: Secondary | ICD-10-CM | POA: Diagnosis not present

## 2019-05-15 DIAGNOSIS — I10 Essential (primary) hypertension: Secondary | ICD-10-CM | POA: Diagnosis not present

## 2019-05-15 DIAGNOSIS — Z0000 Encounter for general adult medical examination without abnormal findings: Secondary | ICD-10-CM | POA: Diagnosis not present

## 2019-05-15 DIAGNOSIS — R079 Chest pain, unspecified: Secondary | ICD-10-CM | POA: Diagnosis not present

## 2019-05-15 DIAGNOSIS — T466X5A Adverse effect of antihyperlipidemic and antiarteriosclerotic drugs, initial encounter: Secondary | ICD-10-CM | POA: Diagnosis not present

## 2019-05-15 DIAGNOSIS — E7800 Pure hypercholesterolemia, unspecified: Secondary | ICD-10-CM | POA: Diagnosis not present

## 2019-05-15 DIAGNOSIS — Z20822 Contact with and (suspected) exposure to covid-19: Secondary | ICD-10-CM | POA: Diagnosis not present

## 2019-05-15 DIAGNOSIS — E118 Type 2 diabetes mellitus with unspecified complications: Secondary | ICD-10-CM | POA: Diagnosis not present

## 2019-05-25 DIAGNOSIS — I739 Peripheral vascular disease, unspecified: Secondary | ICD-10-CM | POA: Diagnosis not present

## 2019-05-25 DIAGNOSIS — E1121 Type 2 diabetes mellitus with diabetic nephropathy: Secondary | ICD-10-CM | POA: Diagnosis not present

## 2019-05-25 DIAGNOSIS — I25810 Atherosclerosis of coronary artery bypass graft(s) without angina pectoris: Secondary | ICD-10-CM | POA: Diagnosis not present

## 2019-05-25 DIAGNOSIS — I10 Essential (primary) hypertension: Secondary | ICD-10-CM | POA: Diagnosis not present

## 2019-05-25 DIAGNOSIS — Z6836 Body mass index (BMI) 36.0-36.9, adult: Secondary | ICD-10-CM | POA: Diagnosis not present

## 2019-08-07 DIAGNOSIS — E7800 Pure hypercholesterolemia, unspecified: Secondary | ICD-10-CM | POA: Diagnosis not present

## 2019-08-07 DIAGNOSIS — E118 Type 2 diabetes mellitus with unspecified complications: Secondary | ICD-10-CM | POA: Diagnosis not present

## 2019-08-07 DIAGNOSIS — I10 Essential (primary) hypertension: Secondary | ICD-10-CM | POA: Diagnosis not present

## 2019-08-07 DIAGNOSIS — Z79899 Other long term (current) drug therapy: Secondary | ICD-10-CM | POA: Diagnosis not present

## 2019-08-14 DIAGNOSIS — E118 Type 2 diabetes mellitus with unspecified complications: Secondary | ICD-10-CM | POA: Diagnosis not present

## 2019-08-14 DIAGNOSIS — I25810 Atherosclerosis of coronary artery bypass graft(s) without angina pectoris: Secondary | ICD-10-CM | POA: Diagnosis not present

## 2019-08-14 DIAGNOSIS — E7800 Pure hypercholesterolemia, unspecified: Secondary | ICD-10-CM | POA: Diagnosis not present

## 2019-08-14 DIAGNOSIS — I10 Essential (primary) hypertension: Secondary | ICD-10-CM | POA: Diagnosis not present

## 2019-08-14 DIAGNOSIS — Z125 Encounter for screening for malignant neoplasm of prostate: Secondary | ICD-10-CM | POA: Diagnosis not present

## 2019-08-14 DIAGNOSIS — Z79899 Other long term (current) drug therapy: Secondary | ICD-10-CM | POA: Diagnosis not present

## 2019-09-04 DIAGNOSIS — I739 Peripheral vascular disease, unspecified: Secondary | ICD-10-CM | POA: Diagnosis not present

## 2019-09-04 DIAGNOSIS — G4710 Hypersomnia, unspecified: Secondary | ICD-10-CM | POA: Diagnosis not present

## 2019-09-04 DIAGNOSIS — G4730 Sleep apnea, unspecified: Secondary | ICD-10-CM | POA: Diagnosis not present

## 2019-09-04 DIAGNOSIS — E118 Type 2 diabetes mellitus with unspecified complications: Secondary | ICD-10-CM | POA: Diagnosis not present

## 2019-09-04 DIAGNOSIS — I10 Essential (primary) hypertension: Secondary | ICD-10-CM | POA: Diagnosis not present

## 2019-09-04 DIAGNOSIS — Z6836 Body mass index (BMI) 36.0-36.9, adult: Secondary | ICD-10-CM | POA: Diagnosis not present

## 2019-09-04 DIAGNOSIS — E1121 Type 2 diabetes mellitus with diabetic nephropathy: Secondary | ICD-10-CM | POA: Diagnosis not present

## 2019-09-04 DIAGNOSIS — E7800 Pure hypercholesterolemia, unspecified: Secondary | ICD-10-CM | POA: Diagnosis not present

## 2019-09-04 DIAGNOSIS — I25810 Atherosclerosis of coronary artery bypass graft(s) without angina pectoris: Secondary | ICD-10-CM | POA: Diagnosis not present

## 2019-11-12 DIAGNOSIS — Z125 Encounter for screening for malignant neoplasm of prostate: Secondary | ICD-10-CM | POA: Diagnosis not present

## 2019-11-12 DIAGNOSIS — Z79899 Other long term (current) drug therapy: Secondary | ICD-10-CM | POA: Diagnosis not present

## 2019-11-12 DIAGNOSIS — I10 Essential (primary) hypertension: Secondary | ICD-10-CM | POA: Diagnosis not present

## 2019-11-12 DIAGNOSIS — E118 Type 2 diabetes mellitus with unspecified complications: Secondary | ICD-10-CM | POA: Diagnosis not present

## 2019-11-12 DIAGNOSIS — E7800 Pure hypercholesterolemia, unspecified: Secondary | ICD-10-CM | POA: Diagnosis not present

## 2019-11-19 DIAGNOSIS — T466X5A Adverse effect of antihyperlipidemic and antiarteriosclerotic drugs, initial encounter: Secondary | ICD-10-CM | POA: Diagnosis not present

## 2019-11-19 DIAGNOSIS — I25810 Atherosclerosis of coronary artery bypass graft(s) without angina pectoris: Secondary | ICD-10-CM | POA: Diagnosis not present

## 2019-11-19 DIAGNOSIS — E7800 Pure hypercholesterolemia, unspecified: Secondary | ICD-10-CM | POA: Diagnosis not present

## 2019-11-19 DIAGNOSIS — E118 Type 2 diabetes mellitus with unspecified complications: Secondary | ICD-10-CM | POA: Diagnosis not present

## 2019-11-19 DIAGNOSIS — G2581 Restless legs syndrome: Secondary | ICD-10-CM | POA: Diagnosis not present

## 2019-11-19 DIAGNOSIS — I10 Essential (primary) hypertension: Secondary | ICD-10-CM | POA: Diagnosis not present

## 2019-11-19 DIAGNOSIS — Z79899 Other long term (current) drug therapy: Secondary | ICD-10-CM | POA: Diagnosis not present

## 2019-11-19 DIAGNOSIS — G720 Drug-induced myopathy: Secondary | ICD-10-CM | POA: Diagnosis not present

## 2020-01-06 ENCOUNTER — Emergency Department: Payer: PPO

## 2020-01-06 ENCOUNTER — Other Ambulatory Visit: Payer: Self-pay

## 2020-01-06 DIAGNOSIS — R509 Fever, unspecified: Secondary | ICD-10-CM | POA: Diagnosis not present

## 2020-01-06 DIAGNOSIS — L03116 Cellulitis of left lower limb: Secondary | ICD-10-CM | POA: Diagnosis not present

## 2020-01-06 DIAGNOSIS — R238 Other skin changes: Secondary | ICD-10-CM | POA: Diagnosis present

## 2020-01-06 DIAGNOSIS — J439 Emphysema, unspecified: Secondary | ICD-10-CM | POA: Diagnosis not present

## 2020-01-06 DIAGNOSIS — Z5321 Procedure and treatment not carried out due to patient leaving prior to being seen by health care provider: Secondary | ICD-10-CM | POA: Diagnosis not present

## 2020-01-06 LAB — CBC WITH DIFFERENTIAL/PLATELET
Abs Immature Granulocytes: 0.04 10*3/uL (ref 0.00–0.07)
Basophils Absolute: 0.1 10*3/uL (ref 0.0–0.1)
Basophils Relative: 1 %
Eosinophils Absolute: 0.1 10*3/uL (ref 0.0–0.5)
Eosinophils Relative: 1 %
HCT: 41.5 % (ref 39.0–52.0)
Hemoglobin: 14.2 g/dL (ref 13.0–17.0)
Immature Granulocytes: 0 %
Lymphocytes Relative: 6 %
Lymphs Abs: 0.6 10*3/uL — ABNORMAL LOW (ref 0.7–4.0)
MCH: 31 pg (ref 26.0–34.0)
MCHC: 34.2 g/dL (ref 30.0–36.0)
MCV: 90.6 fL (ref 80.0–100.0)
Monocytes Absolute: 0.4 10*3/uL (ref 0.1–1.0)
Monocytes Relative: 4 %
Neutro Abs: 8.6 10*3/uL — ABNORMAL HIGH (ref 1.7–7.7)
Neutrophils Relative %: 88 %
Platelets: 150 10*3/uL (ref 150–400)
RBC: 4.58 MIL/uL (ref 4.22–5.81)
RDW: 14.4 % (ref 11.5–15.5)
WBC: 9.8 10*3/uL (ref 4.0–10.5)
nRBC: 0 % (ref 0.0–0.2)

## 2020-01-06 LAB — COMPREHENSIVE METABOLIC PANEL
ALT: 24 U/L (ref 0–44)
AST: 19 U/L (ref 15–41)
Albumin: 4.1 g/dL (ref 3.5–5.0)
Alkaline Phosphatase: 79 U/L (ref 38–126)
Anion gap: 9 (ref 5–15)
BUN: 22 mg/dL (ref 8–23)
CO2: 24 mmol/L (ref 22–32)
Calcium: 8.7 mg/dL — ABNORMAL LOW (ref 8.9–10.3)
Chloride: 103 mmol/L (ref 98–111)
Creatinine, Ser: 0.9 mg/dL (ref 0.61–1.24)
GFR calc Af Amer: >60 mL/min (ref >60)
GFR calc non Af Amer: >60 mL/min (ref >60)
Glucose, Bld: 211 mg/dL — ABNORMAL HIGH (ref 70–99)
Potassium: 4.2 mmol/L (ref 3.5–5.1)
Sodium: 136 mmol/L (ref 135–145)
Total Bilirubin: 0.7 mg/dL (ref 0.3–1.2)
Total Protein: 7.2 g/dL (ref 6.5–8.1)

## 2020-01-06 LAB — URINALYSIS, COMPLETE (UACMP) WITH MICROSCOPIC
Bacteria, UA: NONE SEEN
Bilirubin Urine: NEGATIVE
Glucose, UA: 50 mg/dL — AB
Hgb urine dipstick: NEGATIVE
Ketones, ur: NEGATIVE mg/dL
Leukocytes,Ua: NEGATIVE
Nitrite: NEGATIVE
Protein, ur: 100 mg/dL — AB
Specific Gravity, Urine: 1.019 (ref 1.005–1.030)
pH: 5 (ref 5.0–8.0)

## 2020-01-06 LAB — LACTIC ACID, PLASMA: Lactic Acid, Venous: 1.6 mmol/L (ref 0.5–1.9)

## 2020-01-06 MED ORDER — ACETAMINOPHEN 325 MG PO TABS
650.0000 mg | ORAL_TABLET | Freq: Once | ORAL | Status: AC
  Administered 2020-01-06: 650 mg via ORAL
  Filled 2020-01-06: qty 2

## 2020-01-06 NOTE — ED Triage Notes (Signed)
Pt comes POV with possible cellulitis. Pt scraped his leg on a tomato cage a few days ago and is now red and oozing. Pt has been using muprocin. Localized to about 6 inch radius. Pt reports possible fever.

## 2020-01-07 ENCOUNTER — Emergency Department
Admission: EM | Admit: 2020-01-07 | Discharge: 2020-01-07 | Disposition: A | Discharge status: Home / Self Care | Payer: PPO | Attending: Emergency Medicine | Admitting: Emergency Medicine

## 2020-01-07 DIAGNOSIS — L03116 Cellulitis of left lower limb: Secondary | ICD-10-CM | POA: Diagnosis not present

## 2020-01-07 DIAGNOSIS — S81802A Unspecified open wound, left lower leg, initial encounter: Secondary | ICD-10-CM | POA: Diagnosis not present

## 2020-01-07 DIAGNOSIS — R6883 Chills (without fever): Secondary | ICD-10-CM | POA: Diagnosis not present

## 2020-01-07 LAB — TROPONIN I (HIGH SENSITIVITY)
Troponin I (High Sensitivity): 18 ng/L — ABNORMAL HIGH (ref <18)
Troponin I (High Sensitivity): 27 ng/L — ABNORMAL HIGH (ref <18)

## 2020-01-07 NOTE — ED Notes (Addendum)
Pt called from main ED waiting room for vitals, no answer.

## 2020-01-07 NOTE — ED Notes (Signed)
Pt called from main ED waiting room for vitals, no answer.

## 2020-01-07 NOTE — ED Notes (Signed)
Pt called from [facility] to treatment room, no response 

## 2020-01-28 DIAGNOSIS — I25710 Atherosclerosis of autologous vein coronary artery bypass graft(s) with unstable angina pectoris: Secondary | ICD-10-CM | POA: Diagnosis not present

## 2020-01-28 DIAGNOSIS — E118 Type 2 diabetes mellitus with unspecified complications: Secondary | ICD-10-CM | POA: Diagnosis not present

## 2020-01-28 DIAGNOSIS — E11649 Type 2 diabetes mellitus with hypoglycemia without coma: Secondary | ICD-10-CM | POA: Diagnosis not present

## 2020-01-28 DIAGNOSIS — I739 Peripheral vascular disease, unspecified: Secondary | ICD-10-CM | POA: Diagnosis not present

## 2020-01-28 DIAGNOSIS — I10 Essential (primary) hypertension: Secondary | ICD-10-CM | POA: Diagnosis not present

## 2020-01-28 DIAGNOSIS — R079 Chest pain, unspecified: Secondary | ICD-10-CM | POA: Diagnosis not present

## 2020-01-29 ENCOUNTER — Other Ambulatory Visit
Admission: RE | Admit: 2020-01-29 | Discharge: 2020-01-29 | Disposition: A | Discharge status: Home / Self Care | Payer: PPO | Source: Ambulatory Visit | Attending: Cardiology | Admitting: Cardiology

## 2020-01-29 DIAGNOSIS — Z20822 Contact with and (suspected) exposure to covid-19: Secondary | ICD-10-CM | POA: Diagnosis not present

## 2020-01-29 DIAGNOSIS — Z01812 Encounter for preprocedural laboratory examination: Secondary | ICD-10-CM | POA: Insufficient documentation

## 2020-01-30 LAB — SARS CORONAVIRUS 2 (TAT 6-24 HRS): SARS Coronavirus 2: NEGATIVE

## 2020-02-01 ENCOUNTER — Other Ambulatory Visit: Payer: Self-pay | Admitting: Cardiology

## 2020-02-01 MED ORDER — SODIUM CHLORIDE 0.9% FLUSH: 3.0000 mL | Freq: Two times a day (BID) | INTRAVENOUS | Status: DC

## 2020-02-02 ENCOUNTER — Encounter: Payer: Self-pay | Admitting: Cardiology

## 2020-02-02 ENCOUNTER — Encounter
Admission: RE | Disposition: A | Discharge status: Home / Self Care | Payer: Self-pay | Source: Home / Self Care | Attending: Cardiology

## 2020-02-02 ENCOUNTER — Observation Stay
Admission: RE | Admit: 2020-02-02 | Discharge: 2020-02-03 | Disposition: A | Discharge status: Home / Self Care | Payer: PPO | Attending: Cardiology | Admitting: Cardiology

## 2020-02-02 ENCOUNTER — Other Ambulatory Visit: Payer: Self-pay

## 2020-02-02 DIAGNOSIS — Z7982 Long term (current) use of aspirin: Secondary | ICD-10-CM | POA: Insufficient documentation

## 2020-02-02 DIAGNOSIS — I129 Hypertensive chronic kidney disease with stage 1 through stage 4 chronic kidney disease, or unspecified chronic kidney disease: Secondary | ICD-10-CM | POA: Diagnosis not present

## 2020-02-02 DIAGNOSIS — I25798 Atherosclerosis of other coronary artery bypass graft(s) with other forms of angina pectoris: Secondary | ICD-10-CM | POA: Diagnosis not present

## 2020-02-02 DIAGNOSIS — J449 Chronic obstructive pulmonary disease, unspecified: Secondary | ICD-10-CM | POA: Insufficient documentation

## 2020-02-02 DIAGNOSIS — Z7901 Long term (current) use of anticoagulants: Secondary | ICD-10-CM | POA: Insufficient documentation

## 2020-02-02 DIAGNOSIS — Z79899 Other long term (current) drug therapy: Secondary | ICD-10-CM | POA: Insufficient documentation

## 2020-02-02 DIAGNOSIS — I2510 Atherosclerotic heart disease of native coronary artery without angina pectoris: Secondary | ICD-10-CM | POA: Diagnosis present

## 2020-02-02 DIAGNOSIS — I252 Old myocardial infarction: Secondary | ICD-10-CM | POA: Insufficient documentation

## 2020-02-02 DIAGNOSIS — R079 Chest pain, unspecified: Secondary | ICD-10-CM

## 2020-02-02 DIAGNOSIS — R0789 Other chest pain: Secondary | ICD-10-CM | POA: Diagnosis present

## 2020-02-02 DIAGNOSIS — Z794 Long term (current) use of insulin: Secondary | ICD-10-CM | POA: Diagnosis not present

## 2020-02-02 DIAGNOSIS — E1122 Type 2 diabetes mellitus with diabetic chronic kidney disease: Secondary | ICD-10-CM | POA: Insufficient documentation

## 2020-02-02 DIAGNOSIS — F17220 Nicotine dependence, chewing tobacco, uncomplicated: Secondary | ICD-10-CM | POA: Diagnosis not present

## 2020-02-02 DIAGNOSIS — I25810 Atherosclerosis of coronary artery bypass graft(s) without angina pectoris: Principal | ICD-10-CM | POA: Insufficient documentation

## 2020-02-02 DIAGNOSIS — N189 Chronic kidney disease, unspecified: Secondary | ICD-10-CM | POA: Diagnosis not present

## 2020-02-02 HISTORY — PX: LEFT HEART CATH AND CORS/GRAFTS ANGIOGRAPHY: CATH118250

## 2020-02-02 HISTORY — PX: CORONARY STENT INTERVENTION: CATH118234

## 2020-02-02 LAB — GLUCOSE, CAPILLARY
Glucose-Capillary: 257 mg/dL — ABNORMAL HIGH (ref 70–99)
Glucose-Capillary: 317 mg/dL — ABNORMAL HIGH (ref 70–99)

## 2020-02-02 LAB — POCT ACTIVATED CLOTTING TIME: Activated Clotting Time: 312 seconds

## 2020-02-02 SURGERY — LEFT HEART CATH AND CORS/GRAFTS ANGIOGRAPHY
Anesthesia: Moderate Sedation

## 2020-02-02 MED ORDER — ASPIRIN 81 MG PO CHEW
CHEWABLE_TABLET | ORAL | Status: AC
  Administered 2020-02-02: 81 mg via ORAL
  Filled 2020-02-02: qty 1

## 2020-02-02 MED ORDER — METOPROLOL TARTRATE 50 MG PO TABS
50.0000 mg | ORAL_TABLET | Freq: Two times a day (BID) | ORAL | Status: DC
  Administered 2020-02-02 – 2020-02-03 (×2): 50 mg via ORAL
  Filled 2020-02-02 (×2): qty 1

## 2020-02-02 MED ORDER — ASPIRIN 81 MG PO CHEW
81.0000 mg | CHEWABLE_TABLET | Freq: Every day | ORAL | Status: DC
  Administered 2020-02-03: 81 mg via ORAL
  Filled 2020-02-02: qty 1

## 2020-02-02 MED ORDER — INSULIN DETEMIR 100 UNIT/ML ~~LOC~~ SOLN
30.0000 [IU] | Freq: Every day | SUBCUTANEOUS | Status: DC
  Administered 2020-02-02: 30 [IU] via SUBCUTANEOUS
  Filled 2020-02-02 (×3): qty 0.3

## 2020-02-02 MED ORDER — SODIUM CHLORIDE 0.9 % IV SOLN
INTRAVENOUS | Status: AC | PRN
  Administered 2020-02-02: 1.75 mg/kg/h via INTRAVENOUS

## 2020-02-02 MED ORDER — ASPIRIN 81 MG PO CHEW: 81.0000 mg | CHEWABLE_TABLET | ORAL | Status: AC

## 2020-02-02 MED ORDER — HEPARIN (PORCINE) IN NACL 1000-0.9 UT/500ML-% IV SOLN
INTRAVENOUS | Status: DC | PRN
  Administered 2020-02-02 (×2): 500 mL

## 2020-02-02 MED ORDER — INFLUENZA VAC SPLIT QUAD 0.5 ML IM SUSY
0.5000 mL | PREFILLED_SYRINGE | INTRAMUSCULAR | Status: DC
  Filled 2020-02-02: qty 0.5

## 2020-02-02 MED ORDER — TAMSULOSIN HCL 0.4 MG PO CAPS
0.4000 mg | ORAL_CAPSULE | Freq: Every day | ORAL | Status: DC
  Administered 2020-02-03: 0.4 mg via ORAL
  Filled 2020-02-02: qty 1

## 2020-02-02 MED ORDER — INSULIN ASPART 100 UNIT/ML ~~LOC~~ SOLN
15.0000 [IU] | Freq: Three times a day (TID) | SUBCUTANEOUS | Status: DC
  Administered 2020-02-02 – 2020-02-03 (×3): 15 [IU] via SUBCUTANEOUS
  Filled 2020-02-02 (×3): qty 1

## 2020-02-02 MED ORDER — INSULIN DETEMIR 100 UNIT/ML ~~LOC~~ SOLN
15.0000 [IU] | Freq: Every day | SUBCUTANEOUS | Status: DC
  Administered 2020-02-03: 15 [IU] via SUBCUTANEOUS
  Filled 2020-02-02 (×2): qty 0.15

## 2020-02-02 MED ORDER — MIDAZOLAM HCL 2 MG/2ML IJ SOLN
INTRAMUSCULAR | Status: AC
  Filled 2020-02-02: qty 2

## 2020-02-02 MED ORDER — CLOPIDOGREL BISULFATE 75 MG PO TABS
75.0000 mg | ORAL_TABLET | Freq: Every day | ORAL | Status: DC
  Administered 2020-02-03: 75 mg via ORAL
  Filled 2020-02-02: qty 1

## 2020-02-02 MED ORDER — ACETAMINOPHEN 325 MG PO TABS
650.0000 mg | ORAL_TABLET | ORAL | Status: DC | PRN
  Administered 2020-02-02: 650 mg via ORAL
  Filled 2020-02-02: qty 2

## 2020-02-02 MED ORDER — IPRATROPIUM BROMIDE 0.06 % NA SOLN
2.0000 | Freq: Every day | NASAL | Status: DC
  Administered 2020-02-02: 2 via NASAL
  Filled 2020-02-02: qty 15

## 2020-02-02 MED ORDER — SODIUM CHLORIDE 0.9 % WEIGHT BASED INFUSION: 75.0000 mL/h | INTRAVENOUS | Status: AC

## 2020-02-02 MED ORDER — SODIUM CHLORIDE 0.9% FLUSH
3.0000 mL | Freq: Two times a day (BID) | INTRAVENOUS | Status: DC
  Administered 2020-02-02 – 2020-02-03 (×2): 3 mL via INTRAVENOUS

## 2020-02-02 MED ORDER — ISOSORBIDE MONONITRATE ER 30 MG PO TB24
30.0000 mg | ORAL_TABLET | Freq: Every day | ORAL | Status: DC
  Administered 2020-02-03: 30 mg via ORAL
  Filled 2020-02-02: qty 1

## 2020-02-02 MED ORDER — HYDRALAZINE HCL 20 MG/ML IJ SOLN: 10.0000 mg | INTRAMUSCULAR | Status: AC | PRN

## 2020-02-02 MED ORDER — MIDAZOLAM HCL 2 MG/2ML IJ SOLN
INTRAMUSCULAR | Status: DC | PRN
  Administered 2020-02-02: 1 mg via INTRAVENOUS

## 2020-02-02 MED ORDER — SODIUM CHLORIDE 0.9 % IV SOLN: INTRAVENOUS | Status: DC

## 2020-02-02 MED ORDER — SODIUM CHLORIDE 0.9% FLUSH: 3.0000 mL | INTRAVENOUS | Status: DC | PRN

## 2020-02-02 MED ORDER — LOSARTAN POTASSIUM 50 MG PO TABS
100.0000 mg | ORAL_TABLET | Freq: Every day | ORAL | Status: DC
  Administered 2020-02-03: 100 mg via ORAL
  Filled 2020-02-02: qty 2

## 2020-02-02 MED ORDER — FENTANYL CITRATE (PF) 100 MCG/2ML IJ SOLN
INTRAMUSCULAR | Status: DC | PRN
Start: 2020-02-02 — End: 2020-02-02
  Administered 2020-02-02: 25 ug via INTRAVENOUS

## 2020-02-02 MED ORDER — IPRATROPIUM-ALBUTEROL 20-100 MCG/ACT IN AERS
2.0000 | INHALATION_SPRAY | Freq: Four times a day (QID) | RESPIRATORY_TRACT | Status: DC
  Administered 2020-02-02 – 2020-02-03 (×3): 2 via RESPIRATORY_TRACT
  Filled 2020-02-02: qty 4

## 2020-02-02 MED ORDER — INSULIN NPH (HUMAN) (ISOPHANE) 100 UNIT/ML ~~LOC~~ SUSP: 30.0000 [IU] | Freq: Every day | SUBCUTANEOUS | Status: DC

## 2020-02-02 MED ORDER — SODIUM CHLORIDE 0.9 % WEIGHT BASED INFUSION: 1.0000 mL/kg/h | INTRAVENOUS | Status: DC

## 2020-02-02 MED ORDER — BIVALIRUDIN BOLUS VIA INFUSION - CUPID
INTRAVENOUS | Status: DC | PRN
  Administered 2020-02-02: 100.35 mg via INTRAVENOUS

## 2020-02-02 MED ORDER — CLOPIDOGREL BISULFATE 75 MG PO TABS
ORAL_TABLET | ORAL | Status: AC
  Filled 2020-02-02: qty 8

## 2020-02-02 MED ORDER — IOHEXOL 300 MG/ML  SOLN
INTRAMUSCULAR | Status: DC | PRN
  Administered 2020-02-02: 125 mL via INTRA_ARTERIAL

## 2020-02-02 MED ORDER — SODIUM CHLORIDE 0.9 % IV SOLN: 250.0000 mL | INTRAVENOUS | Status: DC | PRN

## 2020-02-02 MED ORDER — FENTANYL CITRATE (PF) 100 MCG/2ML IJ SOLN
INTRAMUSCULAR | Status: AC
  Filled 2020-02-02: qty 2

## 2020-02-02 MED ORDER — ONDANSETRON HCL 4 MG/2ML IJ SOLN: 4.0000 mg | Freq: Four times a day (QID) | INTRAMUSCULAR | Status: DC | PRN

## 2020-02-02 MED ORDER — NITROGLYCERIN 1 MG/10 ML FOR IR/CATH LAB
INTRA_ARTERIAL | Status: AC
  Filled 2020-02-02: qty 10

## 2020-02-02 MED ORDER — BIVALIRUDIN TRIFLUOROACETATE 250 MG IV SOLR
INTRAVENOUS | Status: AC
  Filled 2020-02-02: qty 250

## 2020-02-02 MED ORDER — IOHEXOL 300 MG/ML  SOLN
INTRAMUSCULAR | Status: DC | PRN
  Administered 2020-02-02: 130 mL

## 2020-02-02 MED ORDER — INSULIN NPH (HUMAN) (ISOPHANE) 100 UNIT/ML ~~LOC~~ SUSP: 15.0000 [IU] | Freq: Every day | SUBCUTANEOUS | Status: DC

## 2020-02-02 MED ORDER — INSULIN REGULAR HUMAN 100 UNIT/ML IJ SOLN: 15.0000 [IU] | Freq: Three times a day (TID) | INTRAMUSCULAR | Status: DC

## 2020-02-02 MED ORDER — HEPARIN (PORCINE) IN NACL 1000-0.9 UT/500ML-% IV SOLN
INTRAVENOUS | Status: AC
  Filled 2020-02-02: qty 1000

## 2020-02-02 MED ORDER — LABETALOL HCL 5 MG/ML IV SOLN: 10.0000 mg | INTRAVENOUS | Status: AC | PRN

## 2020-02-02 MED ORDER — CLOPIDOGREL BISULFATE 75 MG PO TABS
ORAL_TABLET | ORAL | Status: DC | PRN
  Administered 2020-02-02: 600 mg via ORAL

## 2020-02-02 SURGICAL SUPPLY — 21 items
BALLN TREK RX 3.0X12 (BALLOONS) ×4
BALLN ~~LOC~~ TREK RX 4.5X20 (BALLOONS) ×4
BALLOON TREK RX 3.0X12 (BALLOONS) ×2 IMPLANT
BALLOON ~~LOC~~ TREK RX 4.5X20 (BALLOONS) ×2 IMPLANT
CATH INFINITI 5 FR IM (CATHETERS) ×4 IMPLANT
CATH INFINITI 5FR ANG PIGTAIL (CATHETERS) ×4 IMPLANT
CATH INFINITI 5FR JL4 (CATHETERS) ×4 IMPLANT
CATH INFINITI JR4 5F (CATHETERS) ×4 IMPLANT
CATH VISTA GUIDE 6FR JR4 (CATHETERS) ×4 IMPLANT
DEVICE CLOSURE MYNXGRIP 6/7F (Vascular Products) ×4 IMPLANT
KIT ENCORE 26 ADVANTAGE (KITS) ×4 IMPLANT
KIT MANI 3VAL PERCEP (MISCELLANEOUS) ×4 IMPLANT
NEEDLE PERC 18GX7CM (NEEDLE) ×4 IMPLANT
PACK CARDIAC CATH (CUSTOM PROCEDURE TRAY) ×4 IMPLANT
SHEATH AVANTI 5FR X 11CM (SHEATH) ×4 IMPLANT
SHEATH AVANTI 6FR X 11CM (SHEATH) ×4 IMPLANT
SHEATH BRITE TIP 6FR X 23 (SHEATH) ×4 IMPLANT
STENT SYNERGY DES 4X24 (Permanent Stent) ×4 IMPLANT
WIRE EMERALD 3MM-J .035X260CM (WIRE) ×4 IMPLANT
WIRE G HI TQ BMW 190 (WIRE) ×4 IMPLANT
WIRE GUIDERIGHT .035X150 (WIRE) ×4 IMPLANT

## 2020-02-02 NOTE — H&P (Signed)
Chief Complaint: Chief Complaint  Patient presents with  . 6 month follow up  Date of Service: 01/28/2020 Date of Birth: [birth year] PCP: [name], MD  History of Present Illness: [name] is a 64 y.o.male patient who has a history of coronary artery disease s/p cabg In 2006 with a LIMA to the LAD and a svg to the om1, and hypertension. He also has marked obesity and chronic kidney disease however is aggressively working on weight loss and getting success with it.Marland Kitchen He is being treated for diabetes mellitus as well. He is attempting to lose weight on keto diet and he has been fairly successful as he has lost approximately 30 pounds. Recently however he has noted some chest discomfort. Has features both typical atypical of angina. His electrocardiogram at his previous visit revealed sinus rhythm at a rate of 64 with a PR interval of 132 ms, QRS duration 100 ms with a QTC of 408 ms and QRS axis of 20 degrees. There were no ischemic changes. He underwent a functional study which showed an ejection fraction about 35 to 40% with a fixed inferior defect with borderline reversibility. His chest pain is improved with self increasing his nitrates. Marland Kitchen He is asymptomatic at present. EKG today showed nsr at 68 with a pr interval of 154, QRS of 100 ms, and QTc of 393 ms. No obvious ischemia. Pain is similar to his angina and is worse with activity.  Past Medical and Surgical History  Past Medical History Past Medical History:  Diagnosis Date  . Allergic rhinitis  . Asthma  . Chronic kidney disease  . COPD (chronic obstructive pulmonary disease) (CMS-HCC)  . Coronary atherosclerosis of autologous vein bypass graft 07/20/2013  . Depression  . Diabetes mellitus type 2, uncomplicated (CMS-HCC)  Type I (juvenile type) diabetes mellitus without mention of complication, not stated as uncontrolled (250.01)  . Essential hypertension, benign 07/20/2013  . Gout, joint  . History anabolic steroid use  x 5  years in 69s  . Hypersomnia with sleep apnea, unspecified 07/20/2013  . Hypogonadism male  . Morbid obesity (CMS-HCC)  . Myocardial infarct (CMS-HCC)  at age 60  . Other and unspecified hyperlipidemia 07/20/2013  . Perirectal abscess  . Pleurisy  . Pure hypercholesterolemia 07/20/2013  . Sleep apnea  . Varicella   Past Surgical History He has a past surgical history that includes Tonsillectomy; Amputation Toe; Aneurism (May 05 2013); Coronary artery bypass graft (02/2005); Knee arthroscopy (Right, 08/21/2013); Incision & Drainage Abscess Perirectal; and Colonoscopy (11/18/2015).   Medications and Allergies  Current Medications  Current Outpatient Medications  Medication Sig Dispense Refill  . acetaminophen (TYLENOL) 325 MG tablet Take 650 mg by mouth every 4 (four) hours as needed for Pain. Patient normally takes 2 in the morning and 2 at night.  Marland Kitchen apixaban (ELIQUIS) 5 mg tablet Take 1 tablet (5 mg total) by mouth every 12 (twelve) hours. 60 tablet 11  . aspirin 81 MG EC tablet Take 81 mg by mouth once daily.  . biotin 10,000 mcg Cap Take by mouth.  . CYANOCOBALAMIN, VITAMIN B-12, (VITAMIN B-12 ORAL) Take 2,500 mcg by mouth once daily.  . flash glucose scanning reader (FREESTYLE LIBRE READER) Misc Use 1 each once daily. 1 each 1  . flash glucose sensor (FREESTYLE LIBRE SENSOR) Kit Use 3 each every 10 (ten) days. 3 kit 11  . FUROsemide (LASIX) 20 MG tablet Take 1 tablet (20 mg total) by mouth once daily as needed for Edema. Travis  tablet 5  . insulin NPH (HUMULIN N,NOVOLIN N) 100 unit/mL injection Inject subcutaneously 2 (two) times daily before meals. 15 in the morning and 30-45u at bedtime  . insulin REGULAR (HUMULIN R,NOVOLIN R) injection (concentration 100 units/mL) Inject subcutaneously 3 (three) times daily before meals. 15-20 units TID  . ipratropium (ATROVENT) 0.06 % nasal spray Place 2 sprays into both nostrils nightly. 15 mL 5  . ipratropium-albuterol (COMBIVENT) 18-103  mcg/actuation inhaler Inhale 2 inhalations into the lungs 4 (four) times daily as needed. Reported on 08/04/2015  . isosorbide mononitrate (IMDUR) 30 MG ER tablet Take 1 tablet (30 mg total) by mouth 2 (two) times daily. 60 tablet 11  . losartan (COZAAR) 100 MG tablet Take 1 tablet (100 mg total) by mouth once daily. 30 tablet 11  . metFORMIN (GLUCOPHAGE) 1000 MG tablet Take 1 tablet (1,000 mg total) by mouth 2 (two) times daily with meals. 60 tablet 11  . metoprolol tartrate (LOPRESSOR) 50 MG tablet Take 1 tablet (50 mg total) by mouth 2 (two) times daily. 60 tablet 11  . mupirocin (BACTROBAN) 2 % ointment COPAY FOR CREAM IS 85 DOLLARS BUT OINTMENT IS ONLY 5 DOLLARS. CAN WE SWITCH?? 22 g 5  . ONETOUCH ULTRA TEST test strip TEST four times a day as directed 400 each 3  . tamsulosin (FLOMAX) 0.4 mg capsule Take 1 capsule (0.4 mg total) by mouth once daily. Take 30 minutes after same meal each day. 30 capsule 11  . triamcinolone 0.5 % cream Apply topically 2 (two) times daily. Apply 2 x per day (up to 7-10 days) 30 g 5   No current facility-administered medications for this visit.   Allergies: Statins-hmg-coa reductase inhibitors  Social and Family History  Social History reports that he quit smoking about 4 years ago. His smoking use included cigars and cigarettes. He started smoking about 51 years ago. He has a 66.00 pack-year smoking history. He has quit using smokeless tobacco. His smokeless tobacco use included chew and snuff. He reports previous alcohol use. He reports that he does not use drugs.  Family History Family History  Problem Relation Age of Onset  . Coronary Artery Disease (Blocked arteries around heart) Father  . High blood pressure (Hypertension) Father  . Stroke Father  . Alcohol abuse Father  . Asthma Mother  . Diabetes type II Mother  . Colon cancer Mother  . Crohn's disease Brother   Review of Systems  Review of Systems  Constitutional: Negative for chills,  diaphoresis, fever, malaise/fatigue and weight loss.  HENT: Negative for congestion, ear discharge, hearing loss and tinnitus.  Eyes: Negative for blurred vision.  Respiratory: Negative for cough, hemoptysis, sputum production and wheezing.  Cardiovascular: Positive for leg swelling. Negative for chest pain, palpitations, orthopnea, claudication and PND.  Gastrointestinal: Negative for abdominal pain, blood in stool, constipation, diarrhea, heartburn, melena, nausea and vomiting.  Genitourinary: Negative for dysuria, frequency, hematuria and urgency.  Musculoskeletal: Negative for back pain, falls, joint pain and myalgias.  Skin: Negative for itching and rash.  Neurological: Negative for dizziness, tingling, focal weakness, loss of consciousness and weakness.  Endo/Heme/Allergies: Negative for polydipsia. Does not bruise/bleed easily.  Psychiatric/Behavioral: Negative for depression, memory loss and substance abuse. The patient is not nervous/anxious.    Physical Examination   Vitals: BP (!) 150/92  Pulse 51  Resp 16  Ht 194.3 cm (6' 4.5")  Wt (!) 136.5 kg (301 lb)  SpO2 99%  BMI 36.16 kg/m  Ht:194.3 cm (6' 4.5") Wt:(!) 136.5  kg (301 lb) DVZ:RUYP surface area is 2.71 meters squared. Body mass index is 36.16 kg/m.  Wt Readings from Last 3 Encounters:  01/28/20 (!) 136.5 kg (301 lb)  01/07/20 (!) 131.1 kg (289 lb)  11/19/19 (!) 134.3 kg (296 lb)   BP Readings from Last 3 Encounters:  01/28/20 (!) 150/92  01/07/20 117/67  11/19/19 (!) 152/90   General appearance appears in no acute distress  Head Mouth and Eye exam Normocephalic, without obvious abnormality, atraumatic Dentition is good Eyes appear anicteric   LUNGS Breath Sounds: Normal Percussion: Normal  CARDIOVASCULAR JVP CV wave: no HJR: no Elevation at 90 degrees: None Carotid Pulse: normal pulsation bilaterally Bruit: None Apex: apical impulse normal  Auscultation Rhythm: normal sinus rhythm S1:  normal S2: normal Clicks: no Rub: no Murmurs: no murmurs  Gallop: None ABDOMEN Liver enlargement: Not able to determine secondary to body habitus Pulsatile aorta: Not appreciable with obesity Ascites: No obvious ascites per generalize obesity makes this difficult to assess Bruits: no  EXTREMITIES Clubbing: no Edema: 3+ bilateral pedal edema Pulses: peripheral pulses symmetrical Femoral Bruits: no Amputation: no SKIN Rash: no Cyanosis: no Embolic phemonenon: no Bruising: no NEURO Alert and Oriented to person, place and time: yes Non focal: yes  PSYCH: Pt appears to have normal affect  LABS REVIEWED Last 3 CBC results: Lab Results  Component Value Date  WBC 6.4 11/12/2019  WBC 6.6 08/07/2019  WBC 7.4 05/08/2019   Lab Results  Component Value Date  HGB 13.8 (L) 11/12/2019  HGB 14.3 08/07/2019  HGB 14.0 (L) 05/08/2019   Lab Results  Component Value Date  HCT 43.3 11/12/2019  HCT 44.5 08/07/2019  HCT 43.9 05/08/2019   Lab Results  Component Value Date  PLT 152 11/12/2019  PLT 165 08/07/2019  PLT 182 05/08/2019   Lab Results  Component Value Date  CREATININE 0.9 11/12/2019  BUN 28 (H) 11/12/2019  NA 141 11/12/2019  K 5.0 11/12/2019  CL 107 11/12/2019  CO2 27.7 11/12/2019   Lab Results  Component Value Date  HGBA1C 8.8 (H) 11/12/2019   Lab Results  Component Value Date  HDL 43.4 11/12/2019  HDL 43.1 08/07/2019  HDL 35.5 02/03/2019   Lab Results  Component Value Date  LDLCALC 144 (H) 11/12/2019  LDLCALC 132 (H) 08/07/2019  LDLCALC 137 (H) 02/03/2019   Lab Results  Component Value Date  TRIG 109 11/12/2019  TRIG 117 08/07/2019  TRIG 101 02/03/2019   Lab Results  Component Value Date  ALT 18 11/12/2019  AST 12 11/12/2019  ALKPHOS 90 11/12/2019   Assessment and Plan   64 y.o. male with  ICD-10-CM ICD-9-CM  1. Coronary atherosclerosis of autologous vein bypass graft without angina-currently stable with Tums both typical and atypical  of angina. Baseline electrocardiogram unremarkable. No evidence of significant reversible ischemia on recent functional study done in January of this year. We will continue with current medical therapy and follow. Continue with dual anti-platelet therapy. Avoid activity causing symptoms. Due to worsening symptoms, with need to proceed with left heart cath to evaluate. WIll discuss access points with vascular surgery. Risk and benefits of cath explained to the patient. I25.810 414.02  2. Essential hypertension, benign-blood pressure appears fairly well controlled.Marland Kitchen DASH diet recommended I10 401.1  3. Pure hypercholesterolemia-Will continue with atorvastatin at 20 mg daily with an LDL goal of less than 100 O36.5 272.0  4. Type II diabetes mellitus with manifestations-weight loss and continue with metformin and insulin E11.8 250.90  5. Hypersomnia with sleep  apnea weight loss and CPAP. Patient reports compliance. G47.10 780.53  G47.30   6. PVD-bilateral pvd. Will discuss iwht vascular surgery regarding cath access site.   No follow-ups on file.  These notes generated with voice recognition software. I apologize for typographical errors.  Sydnee Levans, MD    Pt seen and examined. No change from above

## 2020-02-02 NOTE — Progress Notes (Signed)
Cardiac stent card given to patient's significant other, Judeen Hammans, at bedside during recovery at the request of the patient.

## 2020-02-03 ENCOUNTER — Encounter: Payer: Self-pay | Admitting: Cardiology

## 2020-02-03 DIAGNOSIS — I25810 Atherosclerosis of coronary artery bypass graft(s) without angina pectoris: Secondary | ICD-10-CM | POA: Diagnosis not present

## 2020-02-03 DIAGNOSIS — I2510 Atherosclerotic heart disease of native coronary artery without angina pectoris: Secondary | ICD-10-CM | POA: Diagnosis not present

## 2020-02-03 LAB — BASIC METABOLIC PANEL
Anion gap: 10 (ref 5–15)
BUN: 16 mg/dL (ref 8–23)
CO2: 28 mmol/L (ref 22–32)
Calcium: 9 mg/dL (ref 8.9–10.3)
Chloride: 100 mmol/L (ref 98–111)
Creatinine, Ser: 0.83 mg/dL (ref 0.61–1.24)
GFR calc non Af Amer: >60 mL/min (ref >60)
Glucose, Bld: 219 mg/dL — ABNORMAL HIGH (ref 70–99)
Potassium: 4.4 mmol/L (ref 3.5–5.1)
Sodium: 138 mmol/L (ref 135–145)

## 2020-02-03 LAB — GLUCOSE, CAPILLARY
Glucose-Capillary: 214 mg/dL — ABNORMAL HIGH (ref 70–99)
Glucose-Capillary: 362 mg/dL — ABNORMAL HIGH (ref 70–99)
Glucose-Capillary: 323 mg/dL — ABNORMAL HIGH (ref 70–99)

## 2020-02-03 LAB — CBC
HCT: 43.1 % (ref 39.0–52.0)
Hemoglobin: 14.4 g/dL (ref 13.0–17.0)
MCH: 30.6 pg (ref 26.0–34.0)
MCHC: 33.4 g/dL (ref 30.0–36.0)
MCV: 91.7 fL (ref 80.0–100.0)
Platelets: 166 10*3/uL (ref 150–400)
RBC: 4.7 MIL/uL (ref 4.22–5.81)
RDW: 13.6 % (ref 11.5–15.5)
WBC: 6.3 10*3/uL (ref 4.0–10.5)
nRBC: 0 % (ref 0.0–0.2)

## 2020-02-03 MED ORDER — ATORVASTATIN CALCIUM 20 MG PO TABS: 40.0000 mg | ORAL_TABLET | Freq: Every day | ORAL | Status: DC

## 2020-02-03 MED ORDER — CLOPIDOGREL BISULFATE 75 MG PO TABS: 75.0000 mg | ORAL_TABLET | Freq: Every day | ORAL | 11 refills | Status: DC

## 2020-02-03 MED ORDER — CLOPIDOGREL BISULFATE 75 MG PO TABS
75.0000 mg | ORAL_TABLET | Freq: Every day | ORAL | 0 refills | Status: DC
Start: 2020-02-04 — End: 2021-05-09

## 2020-02-03 MED ORDER — ISOSORBIDE MONONITRATE ER 30 MG PO TB24
30.0000 mg | ORAL_TABLET | Freq: Every day | ORAL | 0 refills | Status: DC
Start: 2020-02-04 — End: 2020-02-23

## 2020-02-03 MED ORDER — ATORVASTATIN CALCIUM 40 MG PO TABS
40.0000 mg | ORAL_TABLET | Freq: Every day | ORAL | 0 refills | Status: DC
Start: 2020-02-03 — End: 2021-05-19

## 2020-02-03 MED ORDER — ASPIRIN EC 81 MG PO TBEC: 81.0000 mg | DELAYED_RELEASE_TABLET | Freq: Every day | ORAL | 2 refills | Status: DC

## 2020-02-03 NOTE — Discharge Summary (Signed)
Physician Discharge Summary  Patient ID: [name]. MRN: [id number] DOB/AGE: [birth year] 65 y.o.  Admit date: 02/02/2020 Discharge date: 02/03/2020  Admission Diagnoses:  Discharge Diagnoses:  Active Problems:   CAD (coronary artery disease)   Discharged Condition: good  Hospital Course: Pt presented as an outpatient for cardiac cath which revealed occluded lad and pcx with 90% stenosis in the rca. Patent lima to lad an doccluded svg to om1. Underwent pci of rca with des. Did well post pci.   Consults: None  Significant Diagnostic Studies: angiography: cardiac cath and pci  Treatments: IV hydration  Discharge Exam: Blood pressure 126/73, pulse 63, temperature 97.7 F (36.5 C), temperature source Oral, resp. rate 18, height 6\' 4"  (1.93 m), weight 133 kg, SpO2 100 %. General appearance: alert and cooperative Head: Normocephalic, without obvious abnormality, atraumatic Neck: no adenopathy, no carotid bruit, no JVD, supple, symmetrical, trachea midline and thyroid not enlarged, symmetric, no tenderness/mass/nodules Resp: clear to auscultation bilaterally Cardio: regular rate and rhythm, S1, S2 normal, no murmur, click, rub or gallop GI: soft, non-tender; bowel sounds normal; no masses,  no organomegaly Pulses: 2+ and symmetric Neurologic: Grossly normal  Disposition:   Discharge Instructions    AMB Referral to Cardiac Rehabilitation - Phase II   Complete by: As directed    Diagnosis: Coronary Stents   After initial evaluation and assessments completed: Virtual Based Care may be provided alone or in conjunction with Phase 2 Cardiac Rehab based on patient barriers.: Yes       Follow-up Information    Teodoro Spray, MD Follow up in 1 week(s).   Specialty: Cardiology Contact information: [place] [address] [phone]               Signed: Teodoro Spray 02/03/2020, 2:08 PM

## 2020-02-03 NOTE — Progress Notes (Signed)
Patient Name: [name]. Date of Encounter: 02/03/2020  Hospital Problem List     Active Problems:   CAD (coronary artery disease)    Patient Profile     64 y.o.male patient who has a history of coronary artery disease s/p cabg In 2006 with a LIMA to the LAD and a svg to the om1, and hypertension. He also has marked obesity and chronic kidney disease however is aggressively working on weight loss and getting success with it.Marland Kitchen He is being treated for diabetes mellitus as well. He is attempting to lose weight on keto diet and he has been fairly successful as he has lost approximately 30 pounds. Recently however he has noted some chest discomfort. Has features both typical atypical of angina. His electrocardiogram at his previous visit revealed sinus rhythm at a rate of 64 with a PR interval of 132 ms, QRS duration 100 ms with a QTC of 408 ms and QRS axis of 20 degrees. There were no ischemic changes. He underwent a functional study which showed an ejection fraction about 35 to 40% with a fixed inferior defect with borderline reversibility. His chest pain is improved with self increasing his nitrates. Marland Kitchen He is asymptomatic at present. EKG today showed nsr at 68 with a pr interval of 154, QRS of 100 ms, and QTc of  Subjective   Doing well post PCI  Inpatient Medications    . aspirin  81 mg Oral Daily  . clopidogrel  75 mg Oral Q breakfast  . influenza vac split quadrivalent PF  0.5 mL Intramuscular Tomorrow-1000  . insulin aspart  15 Units Subcutaneous TID AC  . insulin detemir  15 Units Subcutaneous QAC breakfast  . insulin detemir  30 Units Subcutaneous QHS  . ipratropium  2 spray Each Nare QHS  . Ipratropium-Albuterol  2 puff Inhalation Q6H  . isosorbide mononitrate  30 mg Oral Daily  . losartan  100 mg Oral Daily  . metoprolol tartrate  50 mg Oral BID  . sodium chloride flush  3 mL Intravenous Q12H  . tamsulosin  0.4 mg Oral Daily    Vital Signs    Vitals:   02/02/20  1940 02/03/20 0213 02/03/20 0820 02/03/20 1140  BP: (!) 144/88 (!) 150/80 (!) 144/83 126/73  Pulse: 75 73 83 63  Resp: 18 18    Temp: 98.2 F (36.8 C) 98.2 F (36.8 C) 98.4 F (36.9 C) 97.7 F (36.5 C)  TempSrc:   Oral Oral  SpO2: 97% 97% 97% 100%  Weight:  133 kg    Height:        Intake/Output Summary (Last 24 hours) at 02/03/2020 1232 Last data filed at 02/02/2020 1800 Gross per 24 hour  Intake 305.25 ml  Output 600 ml  Net -294.75 ml   Filed Weights   02/02/20 1106 02/02/20 1804 02/03/20 0213  Weight: 133.8 kg 134.5 kg 133 kg    Physical Exam    GEN: Well nourished, well developed, in no acute distress.  HEENT: normal.  Neck: Supple, no JVD, carotid bruits, or masses. Cardiac: RRR, no murmurs, rubs, or gallops.  4+ edema bilaterally.  Radials/DP/PT 2+ and equal bilaterally.  Respiratory:  Respirations regular and unlabored, clear to auscultation bilaterally. GI: Soft, nontender, nondistended, BS + x 4. MS: no deformity or atrophy. Skin: warm and dry, no rash. Neuro:  Strength and sensation are intact. Psych: Normal affect.  Labs    CBC Recent Labs    02/03/20 0650  WBC 6.3  HGB 14.4  HCT 43.1  MCV 91.7  PLT 233   Basic Metabolic Panel Recent Labs    02/03/20 0650  NA 138  K 4.4  CL 100  CO2 28  GLUCOSE 219*  BUN 16  CREATININE 0.83  CALCIUM 9.0   Liver Function Tests No results for input(s): AST, ALT, ALKPHOS, BILITOT, PROT, ALBUMIN in the last 72 hours. No results for input(s): LIPASE, AMYLASE in the last 72 hours. Cardiac Enzymes No results for input(s): CKTOTAL, CKMB, CKMBINDEX, TROPONINI in the last 72 hours. BNP No results for input(s): BNP in the last 72 hours. D-Dimer No results for input(s): DDIMER in the last 72 hours. Hemoglobin A1C No results for input(s): HGBA1C in the last 72 hours. Fasting Lipid Panel No results for input(s): CHOL, HDL, LDLCALC, TRIG, CHOLHDL, LDLDIRECT in the last 72 hours. Thyroid Function Tests No  results for input(s): TSH, T4TOTAL, T3FREE, THYROIDAB in the last 72 hours.  Invalid input(s): FREET3  Telemetry    Normal sinus rhythm  ECG    Normal sinus rhythm no ischemia  Radiology    DG Chest 2 View  Result Date: 01/06/2020 CLINICAL DATA:  Lower extremity infection, fever EXAM: CHEST - 2 VIEW COMPARISON:  06/05/2010 FINDINGS: Frontal and lateral views of the chest demonstrates stable postsurgical changes from bypass surgery. The cardiac silhouette is unremarkable. Chronic changes of emphysema with interstitial scarring and hyperinflation. No airspace disease, effusion, or pneumothorax. No acute bony abnormalities. IMPRESSION: 1. Stable emphysema.  No acute process. Electronically Signed   By: Randa Ngo M.D.   On: 01/06/2020 19:02   CARDIAC CATHETERIZATION  Result Date: 02/03/2020  Prox LAD to Mid LAD lesion is 100% stenosed.  Mid Cx lesion is 100% stenosed.  Mid RCA lesion is 90% stenosed.  Origin to Prox Graft lesion is 100% stenosed.  A drug-eluting stent was successfully placed using a STENT SYNERGY DES [id number].  Post intervention, there is a 0% residual stenosis.  1.  High-grade 90% stenosis mid RCA 2.  Successful PCI with DES mid RCA Recommendations 1.  Dual antiplatelet therapy uninterrupted for 1 year   CARDIAC CATHETERIZATION  Result Date: 02/02/2020  Mid RCA lesion is 90% stenosed.  Prox LAD to Mid LAD lesion is 100% stenosed.  Mid Cx lesion is 100% stenosed.  Origin to Prox Graft lesion is 100% stenosed.  LM normal LAD-100% proxiimal LCx-100% mid RCA-90% mid Refer to Dr. Saralyn Pilar to consider pci of RCA    Assessment & Plan  1.  Coronary artery disease-status post cardiac cath revealing significant disease in his RCA with a patent LIMA to the LAD and occluded vein graft to the OM.  Underwent placement of a drug-eluting stent in the RCA with no complications.  Doing well post cath.  Okay for discharge on following medications We will discharge on enteric-coated  aspirin 81 mg daily, Plavix 75 mg daily, isosorbide mononitrate 30 mg daily, losartan 100 mg daily, Eliquis 5 mg twice daily, furosemide 20 mg daily, and atorvastatin 40 mg daily.  L will schedule outpatient follow-up.  1 week in our office.  Signed, Javier Docker Lorie Melichar MD 02/03/2020, 12:32 PM  Pager: [phone]

## 2020-02-03 NOTE — Progress Notes (Signed)
Discussed discharge instructions with patient and wife including medications and follow up appointments.    Discussed handout on femoral site care and S&S of bleeding/infection

## 2020-02-03 NOTE — Progress Notes (Addendum)
Inpatient Diabetes Program Recommendations  AACE/ADA: New Consensus Statement on Inpatient Glycemic Control   Target Ranges:  Prepandial:   less than 140 mg/dL      Peak postprandial:   less than 180 mg/dL (1-2 hours)      Critically ill patients:  140 - 180 mg/dL   Results for [name], [name] (MRN [id number]) as of 02/03/2020 10:49  Ref. Range 02/02/2020 11:36 02/02/2020 22:26 02/03/2020 09:23  Glucose-Capillary Latest Ref Range: 70 - 99 mg/dL 257 (H) 317 (H) 362 (H)   Review of Glycemic Control  Diabetes history: DM2 Outpatient Diabetes medications:Lantus 20-25 units BID, Metformin 1000 mg BID  Current orders for Inpatient glycemic control: Levemir 15 units QAM, Levemir 30 units QHS, Novolog 15 units TID with meals  Inpatient Diabetes Program Recommendations:    Insulin: Please consider ordering CBGs AC&HS, Novolog 0-15 units TID with meals and Novolog 0-5 units QHS.  NOTE: In reviewing chart noted home medication list has Lantus and Metformin for DM medication. However, in H&P it notes NPH, Regular insulin, and Metformin for DM management. Per Care Everywhere patient's last A1C was 8.8% on 11/12/19 and patient sees Dr. Doy Hutching as PCP (last seen 11/19/19 but not able to see prescribed DM medications). Spoke with patient over the phone and he confirms that he is taking only Lantus 20-25 units BID and Metformin 1000 mg BID for outpatient DM medications. Patient states that his average glucose per his glucometer is around 196 mg/dl.  Patient states that his mother recently passed away and that he took care of her for 3 months and during that time he did not keep DM well controlled as he was busy taking care of his mother so he anticipates his next A1C may be a little higher. Patient reports that he use to be on Humulin R U500 in the past and he started a ketogenic diet about 3 years ago and was able to lose weight (has lost 180 pounds over the past 3 years). Patient states that at home he does  not eat a lot of carbohydrates but he notes that since he has been in the hospital, he has been eating more carbohydrates than he would normally eat. Patient states that he plans to get back on track with his health and taking care of himself. Patient verbalized understanding of information discussed and states that he has no questions at this time related to DM.  Thanks, Barnie Alderman, RN, MSN, CDE Diabetes Coordinator Inpatient Diabetes Program [phone] (Team Pager from 8am to 5pm)

## 2020-02-11 DIAGNOSIS — I25710 Atherosclerosis of autologous vein coronary artery bypass graft(s) with unstable angina pectoris: Secondary | ICD-10-CM | POA: Diagnosis not present

## 2020-02-11 DIAGNOSIS — E11649 Type 2 diabetes mellitus with hypoglycemia without coma: Secondary | ICD-10-CM | POA: Diagnosis not present

## 2020-02-11 DIAGNOSIS — I10 Essential (primary) hypertension: Secondary | ICD-10-CM | POA: Diagnosis not present

## 2020-02-11 DIAGNOSIS — I2510 Atherosclerotic heart disease of native coronary artery without angina pectoris: Secondary | ICD-10-CM | POA: Diagnosis not present

## 2020-02-23 ENCOUNTER — Other Ambulatory Visit: Payer: Self-pay

## 2020-02-23 ENCOUNTER — Emergency Department: Payer: PPO

## 2020-02-23 ENCOUNTER — Inpatient Hospital Stay
Admission: EM | Admit: 2020-02-23 | Discharge: 2020-03-03 | DRG: 853 | Disposition: A | Discharge status: Home Health | Payer: PPO | Attending: Internal Medicine | Admitting: Internal Medicine

## 2020-02-23 ENCOUNTER — Encounter: Payer: Self-pay | Admitting: Emergency Medicine

## 2020-02-23 DIAGNOSIS — I252 Old myocardial infarction: Secondary | ICD-10-CM

## 2020-02-23 DIAGNOSIS — Z951 Presence of aortocoronary bypass graft: Secondary | ICD-10-CM

## 2020-02-23 DIAGNOSIS — A419 Sepsis, unspecified organism: Secondary | ICD-10-CM

## 2020-02-23 DIAGNOSIS — Z89421 Acquired absence of other right toe(s): Secondary | ICD-10-CM | POA: Diagnosis not present

## 2020-02-23 DIAGNOSIS — E1152 Type 2 diabetes mellitus with diabetic peripheral angiopathy with gangrene: Secondary | ICD-10-CM | POA: Diagnosis present

## 2020-02-23 DIAGNOSIS — Z7951 Long term (current) use of inhaled steroids: Secondary | ICD-10-CM

## 2020-02-23 DIAGNOSIS — I2510 Atherosclerotic heart disease of native coronary artery without angina pectoris: Secondary | ICD-10-CM | POA: Diagnosis not present

## 2020-02-23 DIAGNOSIS — E1142 Type 2 diabetes mellitus with diabetic polyneuropathy: Secondary | ICD-10-CM | POA: Diagnosis not present

## 2020-02-23 DIAGNOSIS — I739 Peripheral vascular disease, unspecified: Secondary | ICD-10-CM | POA: Diagnosis not present

## 2020-02-23 DIAGNOSIS — M869 Osteomyelitis, unspecified: Secondary | ICD-10-CM | POA: Diagnosis not present

## 2020-02-23 DIAGNOSIS — S98912A Complete traumatic amputation of left foot, level unspecified, initial encounter: Secondary | ICD-10-CM | POA: Diagnosis not present

## 2020-02-23 DIAGNOSIS — Z89422 Acquired absence of other left toe(s): Secondary | ICD-10-CM | POA: Diagnosis not present

## 2020-02-23 DIAGNOSIS — I10 Essential (primary) hypertension: Secondary | ICD-10-CM | POA: Diagnosis present

## 2020-02-23 DIAGNOSIS — L97524 Non-pressure chronic ulcer of other part of left foot with necrosis of bone: Secondary | ICD-10-CM | POA: Diagnosis not present

## 2020-02-23 DIAGNOSIS — E11621 Type 2 diabetes mellitus with foot ulcer: Secondary | ICD-10-CM | POA: Diagnosis present

## 2020-02-23 DIAGNOSIS — Z20822 Contact with and (suspected) exposure to covid-19: Secondary | ICD-10-CM | POA: Diagnosis not present

## 2020-02-23 DIAGNOSIS — G4733 Obstructive sleep apnea (adult) (pediatric): Secondary | ICD-10-CM | POA: Diagnosis present

## 2020-02-23 DIAGNOSIS — E1165 Type 2 diabetes mellitus with hyperglycemia: Secondary | ICD-10-CM | POA: Diagnosis present

## 2020-02-23 DIAGNOSIS — J45909 Unspecified asthma, uncomplicated: Secondary | ICD-10-CM | POA: Diagnosis present

## 2020-02-23 DIAGNOSIS — E1151 Type 2 diabetes mellitus with diabetic peripheral angiopathy without gangrene: Secondary | ICD-10-CM | POA: Diagnosis not present

## 2020-02-23 DIAGNOSIS — E1140 Type 2 diabetes mellitus with diabetic neuropathy, unspecified: Secondary | ICD-10-CM | POA: Diagnosis not present

## 2020-02-23 DIAGNOSIS — Z7901 Long term (current) use of anticoagulants: Secondary | ICD-10-CM | POA: Diagnosis not present

## 2020-02-23 DIAGNOSIS — N400 Enlarged prostate without lower urinary tract symptoms: Secondary | ICD-10-CM | POA: Diagnosis not present

## 2020-02-23 DIAGNOSIS — I70245 Atherosclerosis of native arteries of left leg with ulceration of other part of foot: Secondary | ICD-10-CM | POA: Diagnosis not present

## 2020-02-23 DIAGNOSIS — Z09 Encounter for follow-up examination after completed treatment for conditions other than malignant neoplasm: Secondary | ICD-10-CM

## 2020-02-23 DIAGNOSIS — A4102 Sepsis due to Methicillin resistant Staphylococcus aureus: Principal | ICD-10-CM | POA: Diagnosis present

## 2020-02-23 DIAGNOSIS — Z89432 Acquired absence of left foot: Secondary | ICD-10-CM | POA: Diagnosis not present

## 2020-02-23 DIAGNOSIS — N179 Acute kidney failure, unspecified: Secondary | ICD-10-CM | POA: Diagnosis not present

## 2020-02-23 DIAGNOSIS — Z79899 Other long term (current) drug therapy: Secondary | ICD-10-CM

## 2020-02-23 DIAGNOSIS — Z955 Presence of coronary angioplasty implant and graft: Secondary | ICD-10-CM

## 2020-02-23 DIAGNOSIS — Z7982 Long term (current) use of aspirin: Secondary | ICD-10-CM

## 2020-02-23 DIAGNOSIS — L03115 Cellulitis of right lower limb: Secondary | ICD-10-CM | POA: Diagnosis present

## 2020-02-23 DIAGNOSIS — Z6838 Body mass index (BMI) 38.0-38.9, adult: Secondary | ICD-10-CM

## 2020-02-23 DIAGNOSIS — L97529 Non-pressure chronic ulcer of other part of left foot with unspecified severity: Secondary | ICD-10-CM | POA: Diagnosis present

## 2020-02-23 DIAGNOSIS — Z87891 Personal history of nicotine dependence: Secondary | ICD-10-CM | POA: Diagnosis not present

## 2020-02-23 DIAGNOSIS — Z794 Long term (current) use of insulin: Secondary | ICD-10-CM

## 2020-02-23 DIAGNOSIS — L089 Local infection of the skin and subcutaneous tissue, unspecified: Secondary | ICD-10-CM | POA: Diagnosis not present

## 2020-02-23 DIAGNOSIS — A480 Gas gangrene: Secondary | ICD-10-CM | POA: Diagnosis present

## 2020-02-23 DIAGNOSIS — I70249 Atherosclerosis of native arteries of left leg with ulceration of unspecified site: Secondary | ICD-10-CM | POA: Diagnosis not present

## 2020-02-23 DIAGNOSIS — M8610 Other acute osteomyelitis, unspecified site: Secondary | ICD-10-CM | POA: Diagnosis not present

## 2020-02-23 DIAGNOSIS — L03116 Cellulitis of left lower limb: Principal | ICD-10-CM

## 2020-02-23 DIAGNOSIS — G4730 Sleep apnea, unspecified: Secondary | ICD-10-CM | POA: Diagnosis not present

## 2020-02-23 DIAGNOSIS — L02612 Cutaneous abscess of left foot: Secondary | ICD-10-CM | POA: Diagnosis not present

## 2020-02-23 DIAGNOSIS — E11628 Type 2 diabetes mellitus with other skin complications: Secondary | ICD-10-CM | POA: Diagnosis not present

## 2020-02-23 DIAGNOSIS — R6520 Severe sepsis without septic shock: Secondary | ICD-10-CM | POA: Diagnosis not present

## 2020-02-23 DIAGNOSIS — Z9862 Peripheral vascular angioplasty status: Secondary | ICD-10-CM | POA: Diagnosis not present

## 2020-02-23 DIAGNOSIS — M7989 Other specified soft tissue disorders: Secondary | ICD-10-CM | POA: Diagnosis not present

## 2020-02-23 DIAGNOSIS — J449 Chronic obstructive pulmonary disease, unspecified: Secondary | ICD-10-CM | POA: Diagnosis not present

## 2020-02-23 DIAGNOSIS — E669 Obesity, unspecified: Secondary | ICD-10-CM | POA: Diagnosis present

## 2020-02-23 DIAGNOSIS — M86172 Other acute osteomyelitis, left ankle and foot: Secondary | ICD-10-CM | POA: Diagnosis present

## 2020-02-23 DIAGNOSIS — E785 Hyperlipidemia, unspecified: Secondary | ICD-10-CM | POA: Diagnosis not present

## 2020-02-23 DIAGNOSIS — Z7902 Long term (current) use of antithrombotics/antiplatelets: Secondary | ICD-10-CM

## 2020-02-23 DIAGNOSIS — E1169 Type 2 diabetes mellitus with other specified complication: Secondary | ICD-10-CM | POA: Diagnosis present

## 2020-02-23 DIAGNOSIS — A4902 Methicillin resistant Staphylococcus aureus infection, unspecified site: Secondary | ICD-10-CM | POA: Diagnosis not present

## 2020-02-23 LAB — COMPREHENSIVE METABOLIC PANEL
ALT: 19 U/L (ref 0–44)
AST: 17 U/L (ref 15–41)
Albumin: 3.1 g/dL — ABNORMAL LOW (ref 3.5–5.0)
Alkaline Phosphatase: 83 U/L (ref 38–126)
Anion gap: 10 (ref 5–15)
BUN: 31 mg/dL — ABNORMAL HIGH (ref 8–23)
CO2: 25 mmol/L (ref 22–32)
Calcium: 8.4 mg/dL — ABNORMAL LOW (ref 8.9–10.3)
Chloride: 96 mmol/L — ABNORMAL LOW (ref 98–111)
Creatinine, Ser: 1.34 mg/dL — ABNORMAL HIGH (ref 0.61–1.24)
GFR, Estimated: 59 mL/min — ABNORMAL LOW (ref >60)
Glucose, Bld: 406 mg/dL — ABNORMAL HIGH (ref 70–99)
Potassium: 4.6 mmol/L (ref 3.5–5.1)
Sodium: 131 mmol/L — ABNORMAL LOW (ref 135–145)
Total Bilirubin: 0.9 mg/dL (ref 0.3–1.2)
Total Protein: 6.7 g/dL (ref 6.5–8.1)

## 2020-02-23 LAB — CBC WITH DIFFERENTIAL/PLATELET
Abs Immature Granulocytes: 0.09 10*3/uL — ABNORMAL HIGH (ref 0.00–0.07)
Basophils Absolute: 0.1 10*3/uL (ref 0.0–0.1)
Basophils Relative: 0 %
Eosinophils Absolute: 0 10*3/uL (ref 0.0–0.5)
Eosinophils Relative: 0 %
HCT: 38.9 % — ABNORMAL LOW (ref 39.0–52.0)
Hemoglobin: 12.8 g/dL — ABNORMAL LOW (ref 13.0–17.0)
Immature Granulocytes: 1 %
Lymphocytes Relative: 6 %
Lymphs Abs: 0.9 10*3/uL (ref 0.7–4.0)
MCH: 30.5 pg (ref 26.0–34.0)
MCHC: 32.9 g/dL (ref 30.0–36.0)
MCV: 92.6 fL (ref 80.0–100.0)
Monocytes Absolute: 1.1 10*3/uL — ABNORMAL HIGH (ref 0.1–1.0)
Monocytes Relative: 7 %
Neutro Abs: 14.1 10*3/uL — ABNORMAL HIGH (ref 1.7–7.7)
Neutrophils Relative %: 86 %
Platelets: 161 10*3/uL (ref 150–400)
RBC: 4.2 MIL/uL — ABNORMAL LOW (ref 4.22–5.81)
RDW: 13.5 % (ref 11.5–15.5)
WBC: 16.3 10*3/uL — ABNORMAL HIGH (ref 4.0–10.5)
nRBC: 0 % (ref 0.0–0.2)

## 2020-02-23 LAB — RESPIRATORY PANEL BY RT PCR (FLU A&B, COVID)
Influenza A by PCR: NEGATIVE
Influenza B by PCR: NEGATIVE
SARS Coronavirus 2 by RT PCR: NEGATIVE

## 2020-02-23 LAB — GLUCOSE, CAPILLARY
Glucose-Capillary: 322 mg/dL — ABNORMAL HIGH (ref 70–99)
Glucose-Capillary: 305 mg/dL — ABNORMAL HIGH (ref 70–99)

## 2020-02-23 LAB — MAGNESIUM: Magnesium: 1.8 mg/dL (ref 1.7–2.4)

## 2020-02-23 LAB — LACTIC ACID, PLASMA
Lactic Acid, Venous: 2.1 mmol/L (ref 0.5–1.9)
Lactic Acid, Venous: 1.5 mmol/L (ref 0.5–1.9)

## 2020-02-23 MED ORDER — ISOSORBIDE MONONITRATE ER 60 MG PO TB24: 60.0000 mg | ORAL_TABLET | Freq: Two times a day (BID) | ORAL | Status: DC

## 2020-02-23 MED ORDER — APIXABAN 5 MG PO TABS
5.0000 mg | ORAL_TABLET | Freq: Two times a day (BID) | ORAL | Status: DC
  Administered 2020-02-23 – 2020-03-03 (×14): 5 mg via ORAL
  Filled 2020-02-23 (×14): qty 1

## 2020-02-23 MED ORDER — INSULIN GLARGINE 100 UNIT/ML ~~LOC~~ SOLN
12.0000 [IU] | Freq: Two times a day (BID) | SUBCUTANEOUS | Status: DC
  Administered 2020-02-23 – 2020-02-24 (×2): 12 [IU] via SUBCUTANEOUS
  Filled 2020-02-23 (×3): qty 0.12

## 2020-02-23 MED ORDER — ATORVASTATIN CALCIUM 20 MG PO TABS
40.0000 mg | ORAL_TABLET | Freq: Every day | ORAL | Status: DC
  Administered 2020-02-23 – 2020-03-02 (×5): 40 mg via ORAL
  Filled 2020-02-23 (×7): qty 2

## 2020-02-23 MED ORDER — ACETAMINOPHEN 650 MG RE SUPP: 650.0000 mg | Freq: Four times a day (QID) | RECTAL | Status: DC | PRN

## 2020-02-23 MED ORDER — INSULIN ASPART 100 UNIT/ML ~~LOC~~ SOLN
0.0000 [IU] | Freq: Three times a day (TID) | SUBCUTANEOUS | Status: DC
  Administered 2020-02-23 – 2020-02-24 (×3): 11 [IU] via SUBCUTANEOUS
  Administered 2020-02-25: 15 [IU] via SUBCUTANEOUS
  Administered 2020-02-25 – 2020-02-26 (×3): 11 [IU] via SUBCUTANEOUS
  Administered 2020-02-26 – 2020-02-27 (×3): 8 [IU] via SUBCUTANEOUS
  Administered 2020-02-27 (×2): 5 [IU] via SUBCUTANEOUS
  Administered 2020-02-28 (×3): 3 [IU] via SUBCUTANEOUS
  Administered 2020-02-29: 2 [IU] via SUBCUTANEOUS
  Administered 2020-02-29 – 2020-03-01 (×2): 3 [IU] via SUBCUTANEOUS
  Administered 2020-03-01: 5 [IU] via SUBCUTANEOUS
  Administered 2020-03-01: 2 [IU] via SUBCUTANEOUS
  Administered 2020-03-02: 5 [IU] via SUBCUTANEOUS
  Administered 2020-03-02: 3 [IU] via SUBCUTANEOUS
  Administered 2020-03-03 (×2): 5 [IU] via SUBCUTANEOUS
  Filled 2020-02-23 (×24): qty 1

## 2020-02-23 MED ORDER — PRAMIPEXOLE DIHYDROCHLORIDE 1 MG PO TABS
1.0000 mg | ORAL_TABLET | Freq: Every day | ORAL | Status: DC
  Administered 2020-02-23 – 2020-03-02 (×9): 1 mg via ORAL
  Filled 2020-02-23 (×10): qty 1

## 2020-02-23 MED ORDER — LACTATED RINGERS IV SOLN: INTRAVENOUS | Status: AC

## 2020-02-23 MED ORDER — SODIUM CHLORIDE 0.9 % IV BOLUS
1000.0000 mL | Freq: Once | INTRAVENOUS | Status: AC
  Administered 2020-02-23: 1000 mL via INTRAVENOUS

## 2020-02-23 MED ORDER — VANCOMYCIN HCL IN DEXTROSE 1-5 GM/200ML-% IV SOLN
1000.0000 mg | Freq: Two times a day (BID) | INTRAVENOUS | Status: DC
  Administered 2020-02-23 – 2020-02-25 (×4): 1000 mg via INTRAVENOUS
  Filled 2020-02-23 (×7): qty 200

## 2020-02-23 MED ORDER — PIPERACILLIN-TAZOBACTAM 3.375 G IVPB 30 MIN
3.3750 g | Freq: Once | INTRAVENOUS | Status: AC
  Administered 2020-02-23: 3.375 g via INTRAVENOUS
  Filled 2020-02-23: qty 50

## 2020-02-23 MED ORDER — RISAQUAD PO CAPS
1.0000 | ORAL_CAPSULE | Freq: Every day | ORAL | Status: DC
  Administered 2020-02-26 – 2020-03-03 (×6): 1 via ORAL
  Filled 2020-02-23 (×6): qty 1

## 2020-02-23 MED ORDER — ACETAMINOPHEN 325 MG PO TABS
650.0000 mg | ORAL_TABLET | Freq: Four times a day (QID) | ORAL | Status: DC | PRN
  Administered 2020-03-03: 650 mg via ORAL
  Filled 2020-02-23 (×3): qty 2

## 2020-02-23 MED ORDER — METOPROLOL TARTRATE 50 MG PO TABS
50.0000 mg | ORAL_TABLET | Freq: Two times a day (BID) | ORAL | Status: DC
  Administered 2020-02-23 – 2020-03-03 (×17): 50 mg via ORAL
  Filled 2020-02-23 (×17): qty 1

## 2020-02-23 MED ORDER — CLOPIDOGREL BISULFATE 75 MG PO TABS
75.0000 mg | ORAL_TABLET | Freq: Every day | ORAL | Status: DC
  Administered 2020-02-25 – 2020-03-03 (×7): 75 mg via ORAL
  Filled 2020-02-23 (×7): qty 1

## 2020-02-23 MED ORDER — GADOBUTROL 1 MMOL/ML IV SOLN
10.0000 mL | Freq: Once | INTRAVENOUS | Status: AC | PRN
  Administered 2020-02-23: 10 mL via INTRAVENOUS

## 2020-02-23 MED ORDER — TAMSULOSIN HCL 0.4 MG PO CAPS
0.4000 mg | ORAL_CAPSULE | Freq: Every day | ORAL | Status: DC
  Administered 2020-02-25 – 2020-03-03 (×7): 0.4 mg via ORAL
  Filled 2020-02-23 (×7): qty 1

## 2020-02-23 MED ORDER — ASPIRIN EC 81 MG PO TBEC
81.0000 mg | DELAYED_RELEASE_TABLET | Freq: Every day | ORAL | Status: DC
  Administered 2020-02-25 – 2020-03-03 (×7): 81 mg via ORAL
  Filled 2020-02-23 (×7): qty 1

## 2020-02-23 MED ORDER — PIPERACILLIN-TAZOBACTAM 3.375 G IVPB
3.3750 g | Freq: Three times a day (TID) | INTRAVENOUS | Status: DC
  Administered 2020-02-23 – 2020-03-01 (×21): 3.375 g via INTRAVENOUS
  Filled 2020-02-23 (×20): qty 50

## 2020-02-23 MED ORDER — HYDROCODONE-ACETAMINOPHEN 5-325 MG PO TABS
1.0000 | ORAL_TABLET | Freq: Four times a day (QID) | ORAL | Status: DC | PRN
  Administered 2020-02-23 – 2020-03-01 (×16): 1 via ORAL
  Filled 2020-02-23 (×17): qty 1

## 2020-02-23 MED ORDER — ONDANSETRON HCL 4 MG/2ML IJ SOLN: 4.0000 mg | Freq: Four times a day (QID) | INTRAMUSCULAR | Status: DC | PRN

## 2020-02-23 MED ORDER — PRIMIDONE 250 MG PO TABS
250.0000 mg | ORAL_TABLET | Freq: Two times a day (BID) | ORAL | Status: DC
  Administered 2020-02-23 – 2020-03-03 (×15): 250 mg via ORAL
  Filled 2020-02-23 (×19): qty 1

## 2020-02-23 MED ORDER — VANCOMYCIN HCL IN DEXTROSE 1-5 GM/200ML-% IV SOLN
1000.0000 mg | Freq: Once | INTRAVENOUS | Status: AC
  Administered 2020-02-23: 1000 mg via INTRAVENOUS
  Filled 2020-02-23: qty 200

## 2020-02-23 NOTE — ED Notes (Signed)
Pt transported to MRI 

## 2020-02-23 NOTE — ED Notes (Signed)
Pt presents to ED with c/o of L foot infection. Pt was sent from [facility] clinic. Pt does have a HX of diabetes and prior HX of amputation of toes. 3rd toe on L foot is necrotic in appearence. Pt states he has been having som chills for the past 2-3 days, pt states this has been an ongoing issue for 3 or so weeks. Pt is A&Ox4 and VSS at this time.

## 2020-02-23 NOTE — ED Provider Notes (Signed)
[facility] Emergency Department Provider Note ____________________________________________   First MD Initiated Contact with Patient 02/23/20 [id number]     (approximate)  I have reviewed the triage vital signs and the nursing notes.   HISTORY  Chief Complaint Cellulitis    HPI [name]. is a 64 y.o. male with PMH as noted below including diabetes who presents referred from the podiatry clinic with redness and swelling to the left lower leg and concern for cellulitis.  The patient states that the redness and swelling has worsened over the last 2 to 3 days.  He reports an ulcer that developed on the bottom of the left foot for the last few months.  His third toe also recently became discolored.  He denies any acute pain; he states that he has had chronic numbness to both lower extremities after cardiac surgery 15 years ago, so this is his baseline.  He reports some nausea and subjective fevers.  Past Medical History:  Diagnosis Date  . Asthma   . Coronary artery disease   . Depression   . Diabetes mellitus without complication ([place])   . Gout   . History anabolic steroid use   . Hyperlipidemia   . Hypertension   . Hypogonadism in male   . Morbid obesity ([place])   . Myocardial infarction ([place])   . Peripheral vascular disease ([place])   . Perirectal abscess   . Pleurisy   . Sleep apnea    CPAP at night, no oxygen  . Varicella     Patient Active Problem List   Diagnosis Date Noted  . CAD (coronary artery disease) 02/02/2020  . Diabetes mellitus type 2 in obese ([place]) 02/13/2016  . Essential hypertension, benign 02/13/2016  . PAD (peripheral artery disease) ([place]) 02/13/2016  . Pressure injury of skin 01/25/2016    Past Surgical History:  Procedure Laterality Date  . ABDOMINAL AORTIC ANEURYSM REPAIR    . AMPUTATION TOE Right 02/10/2016   Procedure: AMPUTATION TOE 3RD TOE;  Surgeon: Samara Deist, DPM;  Location: [facility] ORS;  Service: Podiatry;   Laterality: Right;  . COLONOSCOPY WITH PROPOFOL [name] 11/18/2015   Procedure: COLONOSCOPY WITH PROPOFOL;  Surgeon: Manya Silvas, MD;  Location: [facility] ENDOSCOPY;  Service: Endoscopy;  Laterality: [name];  . CORONARY ARTERY BYPASS GRAFT    . CORONARY STENT INTERVENTION [name] 02/02/2020   Procedure: CORONARY STENT INTERVENTION;  Surgeon: Isaias Cowman, MD;  Location: [place] CV [facility];  Service: Cardiovascular;  Laterality: [name];  . KNEE ARTHROSCOPY    . LEFT HEART CATH AND CORS/GRAFTS ANGIOGRAPHY [name] 02/02/2020   Procedure: LEFT HEART CATH AND CORS/GRAFTS ANGIOGRAPHY;  Surgeon: Teodoro Spray, MD;  Location: [place] [facility] LAB;  Service: Cardiovascular;  Laterality: [name];  . PERIPHERAL VASCULAR CATHETERIZATION Right 01/24/2016   Procedure: Lower Extremity Angiography;  Surgeon: Katha Cabal, MD;  Location: [place] [facility];  Service: Cardiovascular;  Laterality: Right;  . PERIPHERAL VASCULAR CATHETERIZATION Right 01/25/2016   Procedure: Lower Extremity Angiography;  Surgeon: Katha Cabal, MD;  Location: [place] [facility];  Service: Cardiovascular;  Laterality: Right;  . TOE AMPUTATION    . TONSILLECTOMY      Prior to Admission medications   Medication Sig Start Date End Date Taking? Authorizing Provider  albuterol (PROVENTIL) (2.5 MG/3ML) 0.083% nebulizer solution Take 2.5 mg by nebulization every 6 (six) hours as needed for wheezing or shortness of breath.    [provider]  apixaban (ELIQUIS) 5 MG TABS tablet  Take 1 tablet (5 mg total) by mouth 2 (two) times daily. 01/26/16   Stegmayer, Janalyn Harder, PA-C  aspirin EC 81 MG tablet Take 1 tablet (81 mg total) by mouth daily. Swallow whole. 02/03/20 02/02/21  Teodoro Spray, MD  atorvastatin (LIPITOR) 40 MG tablet Take 1 tablet (40 mg total) by mouth daily. 02/03/20   Teodoro Spray, MD  clopidogrel (PLAVIX) 75 MG tablet Take 1 tablet (75 mg total) by mouth daily. 02/03/20 02/02/21  Teodoro Spray, MD  clopidogrel  (PLAVIX) 75 MG tablet Take 1 tablet (75 mg total) by mouth daily with breakfast. 02/04/20   Teodoro Spray, MD  ferrous sulfate 325 (65 FE) MG tablet Take 325 mg by mouth daily with breakfast.    [provider]  furosemide (LASIX) 20 MG tablet Take 20 mg by mouth daily as needed for fluid or edema.     [provider]  ipratropium-albuterol (DUONEB) 0.5-2.5 (3) MG/3ML SOLN Take 3 mLs by nebulization every 6 (six) hours as needed. Patient not taking: Reported on 02/02/2020    [provider]  isosorbide mononitrate (IMDUR) 30 MG 24 hr tablet Take 1 tablet (30 mg total) by mouth daily. 02/04/20   Teodoro Spray, MD  Lactobacillus (ACIDOPHILUS) CAPS capsule Take 1 capsule by mouth daily.    [provider]  LANTUS SOLOSTAR 100 UNIT/ML Solostar Pen Inject 25 Units into the skin in the morning and at bedtime. 11/24/19   [provider]  losartan (COZAAR) 100 MG tablet Take 100 mg by mouth daily.    [provider]  MEDIUM CHAIN TRIGLYCERIDES PO Take 1,000 mg by mouth daily.    [provider]  metFORMIN (GLUCOPHAGE) 1000 MG tablet Take 1,000 mg by mouth 2 (two) times daily.  12/02/17   [provider]  metoprolol (LOPRESSOR) 50 MG tablet Take 50 mg by mouth 2 (two) times daily.    [provider]  Multiple Vitamins-Minerals (MULTIVITAMIN WITH MINERALS) tablet Take 1 tablet by mouth daily.    [provider]  nitroGLYCERIN (NITROSTAT) 0.4 MG SL tablet Place 0.4 mg under the tongue daily as needed. 01/25/20   [provider]  pramipexole (MIRAPEX) 1 MG tablet Take 1 mg by mouth at bedtime. 08/24/19   [provider]  primidone (MYSOLINE) 250 MG tablet Take 250 mg by mouth 2 (two) times daily. 01/23/20   [provider]  tamsulosin (FLOMAX) 0.4 MG CAPS capsule Take 0.4 mg by mouth daily.    [provider]  vitamin B-12 (CYANOCOBALAMIN) 1000 MCG tablet Take 10,000 mcg by mouth daily.  Energy shot    [provider]  zinc gluconate 50 MG tablet Take 50 mg by mouth daily.    [provider]    Allergies Patient has no known allergies.  No family history on file.  Social History Social History   Tobacco Use  . Smoking status: Former Smoker    Packs/day: 0.50    Years: 45.00    Pack years: 22.50    Types: Cigarettes    Quit date: 04/05/2015    Years since quitting: 4.8  . Smokeless tobacco: Never Used  Vaping Use  . Vaping Use: Every day  Substance Use Topics  . Alcohol use: Not Currently    Alcohol/week: 3.0 standard drinks    Types: 3 Glasses of wine per week  . Drug use: No    Review of Systems  Constitutional: Positive for subjective fever. Eyes: No redness  ENT: No sore throat. Cardiovascular: Denies chest pain. Respiratory: Denies shortness of breath. Gastrointestinal: Positive for nausea. Genitourinary: Negative for dysuria.  Musculoskeletal: Negative for back pain.  Positive for left lower leg swelling. Skin: Positive for redness to left lower leg. Neurological: Negative for acute weakness or numbness.   ____________________________________________   PHYSICAL EXAM:  VITAL SIGNS: ED Triage Vitals  Enc Vitals Group     BP 02/23/20 0930 (!) 122/102     Pulse Rate 02/23/20 0930 96     Resp 02/23/20 0930 20     Temp 02/23/20 0930 99.9 F (37.7 C)     Temp Source 02/23/20 0930 Oral     SpO2 02/23/20 0930 94 %     Weight 02/23/20 0931 293 lb 3.4 oz (133 kg)     Height 02/23/20 0931 6\' 4"  (1.93 m)     Head Circumference --      Peak Flow --      Pain Score 02/23/20 0931 3     Pain Loc --      Pain Edu? --      Excl. in [place]? --     Constitutional: Alert and oriented.  Relatively well appearing and in no acute distress. Eyes: Conjunctivae are normal.  Head: Atraumatic. Nose: No congestion/rhinnorhea. Mouth/Throat: Mucous membranes are slightly dry. Neck: Normal range of motion.  Cardiovascular: Normal rate,  regular rhythm.  Good peripheral circulation. Respiratory: Normal respiratory effort.  No retractions.  Gastrointestinal: No distention.  Musculoskeletal: 1+ left lower extremity edema.  Erythema, induration and swelling to the dorsal left foot and lower leg extending up halfway to the knee.  No crepitus.  Approximately 1 cm open ulcer to the plantar aspect of the left foot with no surrounding erythema or induration.  It is currently not draining any fluid.  Third toe appears dusky.  Other toes with normal color and cap refill. Neurologic:  Normal speech and language. No gross focal neurologic deficits are appreciated.  Skin:  Skin is warm and dry. No rash noted. Psychiatric: Mood and affect are normal. Speech and behavior are normal.  ____________________________________________   LABS (all labs ordered are listed, but only abnormal results are displayed)  Labs Reviewed  LACTIC ACID, PLASMA - Abnormal; Notable for the following components:      Result Value   Lactic Acid, Venous 2.1 (*)    All other components within normal limits  COMPREHENSIVE METABOLIC PANEL - Abnormal; Notable for the following components:   Sodium 131 (*)    Chloride 96 (*)    Glucose, Bld 406 (*)    BUN 31 (*)    Creatinine, Ser 1.34 (*)    Calcium 8.4 (*)    Albumin 3.1 (*)    GFR, Estimated 59 (*)    All other components within normal limits  CBC WITH DIFFERENTIAL/PLATELET - Abnormal; Notable for the following components:   WBC 16.3 (*)    RBC 4.20 (*)    Hemoglobin 12.8 (*)    HCT 38.9 (*)    Neutro Abs 14.1 (*)    Monocytes Absolute 1.1 (*)    Abs Immature Granulocytes 0.09 (*)    All other components within normal limits  RESPIRATORY PANEL BY RT PCR (FLU A&B, COVID)  CULTURE, BLOOD (ROUTINE X 2)  CULTURE, BLOOD (ROUTINE X 2)  LACTIC ACID, PLASMA  CBG MONITORING, ED   ____________________________________________  EKG   ____________________________________________  RADIOLOGY  XR L foot:  No x-ray evidence of acute osteomyelitis.  Gas  within the soft tissue of the third toe. MRI L foot: Pending  ____________________________________________   PROCEDURES  Procedure(s) performed: No  Procedures  Critical Care performed: No ____________________________________________   INITIAL IMPRESSION / ASSESSMENT AND PLAN / ED COURSE  Pertinent labs & imaging results that were available during my care of the patient were reviewed by me and considered in my medical decision making (see chart for details).  64 year old male with PMH as noted above presents with worsening erythema and swelling to the left lower leg and left foot over the last few days.  He has had a diabetic ulcer on the plantar aspect of the foot for some time and was seen in podiatry today and referred to the ED.  I reviewed the past medical records in [place].  The patient was admitted a few weeks ago for a cardiac cath without complications.  He was seen by Dr. Vickki Muff from podiatry today and referred to the ED.  On exam, the patient is overall relatively well-appearing.  His vital signs are normal except for borderline elevated temperature.  He has erythema and induration to the left foot and lower leg up to about halfway towards the knee.  There is a small ulcer on the plantar aspect of the left foot which currently is not draining.  The left third toe appears purple and dusky.  Color and cap refill are normal to the other toes.  Presentation is consistent with cellulitis to the left lower leg, versus possible osteomyelitis.  I am concerned for gangrene to the left third toe although this appears subacute.  There does not appear to be an acute circulatory issues the rest of the left lower leg.  Initial x-ray reveals some gas in the soft tissues.  This is likely from the ulcer.  I do not suspect necrotizing fasciitis based on the clinical exam.  We will obtain lab work-up, start broad-spectrum antibiotics, and proceed with  MRI to eval for osteomyelitis or other complication.  I anticipate admission.  ----------------------------------------- 1:56 PM on 02/23/2020 -----------------------------------------  Work-up is consistent with systemic infection/possible sepsis.  Broad-spectrum antibiotics were administered.  The patient's vital signs remain stable.  MRI is pending.  I discussed the case with Dr. Luana Shu from podiatry who confirms that podiatry will evaluate the patient today.  I then discussed the case with the hospitalist for admission. ____________________________________________   FINAL CLINICAL IMPRESSION(S) / ED DIAGNOSES  Final diagnoses:  Cellulitis of left lower extremity      NEW MEDICATIONS STARTED DURING THIS VISIT:  New Prescriptions   No medications on file     Note:  This document was prepared using Dragon voice recognition software and may include unintentional dictation errors.    Arta Silence, MD 02/23/20 1357

## 2020-02-23 NOTE — ED Triage Notes (Signed)
Patient to ED from [facility] for c/o left foot infection. Patient reports he has had issues for approx one month. +Redness and warmth present. Patient describes clear drainage with foul odor. Patient reports temp of 99.9 at home.

## 2020-02-23 NOTE — Progress Notes (Signed)
Pharmacy Antibiotic Note  [name]. is a 64 y.o. male admitted on 02/23/2020 with sepsis and cellulitis.  Pharmacy has been consulted for Vancomycin and Zosyn dosing.  Plan: Vancomycin 1g IV q12h per nomogram.   Zosyn 3.375g IV q8h extended infusion.  Monitor renal function closely.  Height: 6\' 4"  (193 cm) Weight: 133 kg (293 lb 3.4 oz) IBW/kg (Calculated) : 86.8  Temp (24hrs), Avg:99.9 F (37.7 C), Min:99.9 F (37.7 C), Max:99.9 F (37.7 C)  Recent Labs  Lab 02/23/20 1058 02/23/20 1454  WBC 16.3*  --   CREATININE 1.34*  --   LATICACIDVEN 2.1* 1.5    Estimated Creatinine Clearance: 81.9 mL/min (A) (by C-G formula based on SCr of 1.34 mg/dL (H)).    No Known Allergies  Antimicrobials this admission: vancomcyin 10/26 >>  zosyn 10/26 >>   Dose adjustments this admission:  Microbiology results:  Thank you for allowing pharmacy to be a part of this patients care.  Paulina Fusi, PharmD, BCPS 02/23/2020 4:43 PM

## 2020-02-23 NOTE — Consult Note (Signed)
ORTHOPAEDIC CONSULTATION  REQUESTING PHYSICIAN: Howerter, Ethelda Chick, DO  Chief Complaint: Left foot infection.  HPI: [name]. is a 64 y.o. male who complains of worsening redness and swelling with drainage to his left foot.  He had an ulcer there for a couple of months now.  They try to take care of it but it has progressively become worse.  He has had some chills of recent.  Seen in the outpatient clinic today with noted cellulitis to his leg with obvious early necrosis of his left third toe.  He was sent to the ER for admission for evaluation.  Upon admission he has an elevated white blood cell count.  X-ray showed gas in the soft tissue of the third toe and MRI showed the same.  No obvious osteomyelitis at this time.  He has known history of diabetes with neuropathy.  He status post multiple toe amputations on his right foot.  Past Medical History:  Diagnosis Date  . Asthma   . Coronary artery disease   . Depression   . Diabetes mellitus without complication ([place])   . Gout   . History anabolic steroid use   . Hyperlipidemia   . Hypertension   . Hypogonadism in male   . Morbid obesity ([place])   . Myocardial infarction ([place])   . Peripheral vascular disease ([place])   . Perirectal abscess   . Pleurisy   . Sleep apnea    CPAP at night, no oxygen  . Varicella    Past Surgical History:  Procedure Laterality Date  . ABDOMINAL AORTIC ANEURYSM REPAIR    . AMPUTATION TOE Right 02/10/2016   Procedure: AMPUTATION TOE 3RD TOE;  Surgeon: Samara Deist, DPM;  Location: [facility] ORS;  Service: Podiatry;  Laterality: Right;  . COLONOSCOPY WITH PROPOFOL [name] 11/18/2015   Procedure: COLONOSCOPY WITH PROPOFOL;  Surgeon: Manya Silvas, MD;  Location: [facility] ENDOSCOPY;  Service: Endoscopy;  Laterality: [name];  . CORONARY ARTERY BYPASS GRAFT    . CORONARY STENT INTERVENTION [name] 02/02/2020   Procedure: CORONARY STENT INTERVENTION;  Surgeon: Isaias Cowman, MD;  Location: [place] [facility];   Service: Cardiovascular;  Laterality: [name];  . KNEE ARTHROSCOPY    . LEFT HEART CATH AND CORS/GRAFTS ANGIOGRAPHY [name] 02/02/2020   Procedure: LEFT HEART CATH AND CORS/GRAFTS ANGIOGRAPHY;  Surgeon: Teodoro Spray, MD;  Location: [place] [facility];  Service: Cardiovascular;  Laterality: [name];  . PERIPHERAL VASCULAR CATHETERIZATION Right 01/24/2016   Procedure: Lower Extremity Angiography;  Surgeon: Katha Cabal, MD;  Location: [place] [facility];  Service: Cardiovascular;  Laterality: Right;  . PERIPHERAL VASCULAR CATHETERIZATION Right 01/25/2016   Procedure: Lower Extremity Angiography;  Surgeon: Katha Cabal, MD;  Location: [place] [facility];  Service: Cardiovascular;  Laterality: Right;  . TOE AMPUTATION    . TONSILLECTOMY     Social History   Socioeconomic History  . Marital status: Widowed    Spouse name: Not on file  . Number of children: Not on file  . Years of education: Not on file  . Highest education level: Not on file  Occupational History  . Not on file  Tobacco Use  . Smoking status: Former Smoker    Packs/day: 0.50    Years: 45.00    Pack years: 22.50    Types: Cigarettes    Quit date: 04/05/2015    Years since quitting: 4.8  . Smokeless tobacco: Never Used  Vaping Use  . Vaping Use: Every day  Substance and Sexual Activity  . Alcohol use: Not Currently    Alcohol/week: 3.0 standard drinks    Types: 3 Glasses of wine per week  . Drug use: No  . Sexual activity: Not on file  Other Topics Concern  . Not on file  Social History Narrative  . Not on file   Social Determinants of Health   Financial Resource Strain:   . Difficulty of Paying Living Expenses: Not on file  Food Insecurity:   . Worried About Charity fundraiser in the Last Year: Not on file  . Ran Out of Food in the Last Year: Not on file  Transportation Needs:   . Lack of Transportation (Medical): Not on file  . Lack of Transportation (Non-Medical): Not on file  Physical Activity:    . Days of Exercise per Week: Not on file  . Minutes of Exercise per Session: Not on file  Stress:   . Feeling of Stress : Not on file  Social Connections:   . Frequency of Communication with Friends and Family: Not on file  . Frequency of Social Gatherings with Friends and Family: Not on file  . Attends Religious Services: Not on file  . Active Member of Clubs or Organizations: Not on file  . Attends Archivist Meetings: Not on file  . Marital Status: Not on file   History reviewed. No pertinent family history. No Known Allergies Prior to Admission medications   Medication Sig Start Date End Date Taking? Authorizing Provider  apixaban (ELIQUIS) 5 MG TABS tablet Take 1 tablet (5 mg total) by mouth 2 (two) times daily. 01/26/16  Yes Stegmayer, Janalyn Harder, PA-C  aspirin EC 81 MG tablet Take 1 tablet (81 mg total) by mouth daily. Swallow whole. 02/03/20 02/02/21 Yes Teodoro Spray, MD  atorvastatin (LIPITOR) 40 MG tablet Take 1 tablet (40 mg total) by mouth daily. 02/03/20  Yes Teodoro Spray, MD  clopidogrel (PLAVIX) 75 MG tablet Take 1 tablet (75 mg total) by mouth daily with breakfast. 02/04/20  Yes Fath, Javier Docker, MD  ferrous sulfate 325 (65 FE) MG tablet Take 325 mg by mouth daily with breakfast.   Yes [provider]  furosemide (LASIX) 20 MG tablet Take 20 mg by mouth daily as needed for fluid or edema.    Yes [provider]  isosorbide mononitrate (IMDUR) 60 MG 24 hr tablet Take 60 mg by mouth 2 (two) times daily. 02/22/20  Yes [provider]  Lactobacillus (ACIDOPHILUS) CAPS capsule Take 1 capsule by mouth daily.   Yes [provider]  LANTUS SOLOSTAR 100 UNIT/ML Solostar Pen Inject 26 Units into the skin in the morning and at bedtime.  11/24/19  Yes [provider]  losartan (COZAAR) 100 MG tablet Take 100 mg by mouth daily.   Yes [provider]  MEDIUM CHAIN TRIGLYCERIDES PO Take 1,000 mg by mouth daily.   Yes  [provider]  metFORMIN (GLUCOPHAGE) 1000 MG tablet Take 1,000 mg by mouth 2 (two) times daily.  12/02/17  Yes [provider]  metoprolol (LOPRESSOR) 50 MG tablet Take 50 mg by mouth 2 (two) times daily.   Yes [provider]  Multiple Vitamins-Minerals (MULTIVITAMIN WITH MINERALS) tablet Take 1 tablet by mouth daily.   Yes [provider]  pramipexole (MIRAPEX) 1 MG tablet Take 1 mg by mouth at bedtime. 08/24/19  Yes [provider]  primidone (MYSOLINE) 250 MG tablet Take 250 mg by mouth 2 (  two) times daily. 01/23/20  Yes [provider]  tamsulosin (FLOMAX) 0.4 MG CAPS capsule Take 0.4 mg by mouth daily.   Yes [provider]  vitamin B-12 (CYANOCOBALAMIN) 1000 MCG tablet Take 10,000 mcg by mouth daily. Energy shot   Yes [provider]  zinc gluconate 50 MG tablet Take 50 mg by mouth daily.   Yes [provider]  albuterol (PROVENTIL) (2.5 MG/3ML) 0.083% nebulizer solution Take 2.5 mg by nebulization every 6 (six) hours as needed for wheezing or shortness of breath.    [provider]  ipratropium-albuterol (DUONEB) 0.5-2.5 (3) MG/3ML SOLN Take 3 mLs by nebulization every 6 (six) hours as needed. Patient not taking: Reported on 02/02/2020    [provider]  nitroGLYCERIN (NITROSTAT) 0.4 MG SL tablet Place 0.4 mg under the tongue daily as needed. 01/25/20   [provider]   MR FOOT LEFT W WO CONTRAST  Result Date: 02/23/2020 CLINICAL DATA:  Diabetic patient with a necrotic appearing left third toe. EXAM: MRI OF THE LEFT FOREFOOT WITHOUT AND WITH CONTRAST TECHNIQUE: Multiplanar, multisequence MR imaging of the left forefoot was performed both before and after administration of intravenous contrast. CONTRAST:  10 mL GADAVIST IV SOLN COMPARISON:  Plain films left foot 02/23/2020. FINDINGS: Bones/Joint/Cartilage No marrow edema or enhancement to suggest osteomyelitis is identified. Mild first  MTP degenerative change is seen. Small, benign cyst in the bases of the third and fourth metatarsals are noted. No joint effusion. Ligaments Intact. Muscles and Tendons Intact. No intramuscular fluid collection. There is fatty atrophy of intrinsic musculature the foot. Soft tissues Skin ulceration is seen on the plantar surface of the foot deep to the third MTP joint. There is extensive subcutaneous edema diffusely about the foot and third toe. A large volume of gas is seen in the soft tissues about the third toe and in the third intermetatarsal space. IMPRESSION: Skin wound deep to the third MTP joint with underlying cellulitis and extensive soft tissue gas. Negative for abscess, osteomyelitis or septic joint. Electronically Signed   By: Inge Rise M.D.   On: 02/23/2020 14:43   DG Foot Complete Left  Result Date: 02/23/2020 CLINICAL DATA:  Left foot infection with drainage EXAM: LEFT FOOT - COMPLETE 3+ VIEW COMPARISON:  None. FINDINGS: No acute fracture or dislocation. Are no focal bony erosion is identified. There is a probable plantar ulceration in the region of the third MTP joint. There is soft tissue swelling of the third digit with extensive gas within the soft tissues extending from the level of the distal phalanx to the third metatarsal head. Diffuse soft tissue swelling of the forefoot. IMPRESSION: 1. No specific radiographic evidence of osteomyelitis. 2. Soft tissue swelling of the left third toe with extensive gas within the soft tissues, concerning for infection with a gas-forming organism. Electronically Signed   By: Davina Poke D.O.   On: 02/23/2020 10:09    Positive ROS: All other systems have been reviewed and were otherwise negative with the exception of those mentioned in the HPI and as above.  12 point ROS was performed.  Physical Exam: General: Alert and oriented.  No apparent distress.  Vascular:  Left foot:Dorsalis Pedis:  diminished Posterior Tibial:   diminished  Right foot: Dorsalis Pedis:  absent Posterior Tibial:  absent  Neuro:absent protective sensation  Derm: Right foot without ulceration.  Left foot with noted necrosis of the third toe.  There is an open ulceration to the plantar aspect of the third  MTPJ with foul odor.  Diffuse erythema to the foot and lower leg to the mid calf mid tibial region.  Ortho/MS: Diffuse edema to the left lower extremity at this time.  Pitting in nature.  Assessment: Gas producing infection left third toe Diabetes with ulceration left foot Diabetic foot infection  Plan: Patient has gas producing infection with necrosis of the third toe.  He will need debridement and I&D of the area.  I went ahead and consented him today for surgical intervention.  At minimum he will need an amputation of the third toe with likely removal of the metatarsal head and debridement of the ulcerative site.  I discussed this with the patient and consent has been given.  Orders were placed and will perform surgery tomorrow.  Dr. Luana Shu will take over as he is on-call and he will plan for surgery tomorrow.  I have discussed the case with Dr. Luana Shu.      Elesa Hacker, DPM Cell [phone]   02/23/2020 5:25 PM

## 2020-02-23 NOTE — H&P (Signed)
History and Physical    PLEASE NOTE THAT DRAGON DICTATION SOFTWARE WAS USED IN THE CONSTRUCTION OF THIS NOTE.   [name]. TSV:[id number] DOB: July 26, 1955 DOA: 02/23/2020  PCP: [name], MD Patient coming from: home   I have personally briefly reviewed patient's old medical records in [place]  Chief Complaint: redness and swelling of left foot  HPI: [name]. is a 64 y.o. male with medical history significant for peripheral artery disease on chronic Eliquis therapy, coronary artery disease status post PCI with drug-eluting stent to the mid RCA on 02/03/2020, type 2 diabetes mellitus, hypertension, obstructive sleep apnea compliant on home nocturnal CPAP, who is admitted to [facility] on 02/23/2020 with severe sepsis due to left lower extremity cellulitis after presenting from home to [facility] Department complaining of left lower extremity erythema.   In the context of an ulcer on the plantar surface of his left foot for which she has been closely following with podiatry, the patient reports development of progressive erythema, swelling, and increased warmth relating to the dorsal aspect of his left foot, with radiation of such approximately over the anterior aspect of the left leg, terminating distal to the left knee.  He notes intermittent associated serous discharge. reports associated subjective fever in the absence of any chills, rigors, or generalized myalgias.  He also notes mild intermittent nausea over the last 1 to 2 days in the absence of any vomiting.  Denies any diarrhea, abdominal pain, melena, or hematochezia.  In the setting of the above symptoms, the patient had an acute appointment in podiatry clinic earlier today with Dr. Vickki Muff at which time he was instructed to present to the emergency department for further evaluation of potential underlying cellulitis of the left lower extremity.   Denies any  recent headache, neck stiffness, rhinitis, rhinorrhea, sore throat, shortness of breath, cough.  No recent traveling or known COVID-19 exposures.  Denies any recent dysuria, gross hematuria, or change in urinary urgency/frequency.   The patient confirms that he underwent coronary angiography on 02/03/2020 which revealed 90% stenosis of the mid RCA which time he underwent PCI with drug-eluting stent placement to the mid RCA.  Per my review of the ensuing cardiology note, the patient is to remain on on dual antiplatelet therapy without interruption for the next 1 year.  Denies any ensuing chest pain, shortness of breath, palpitations, diaphoresis, dizziness, presyncope, or syncope.  The patient reports that in the context of a history of peripheral artery disease, and he had previously undergone right-sided popliteal stent placement, and that he has been instructed to remain on Eliquis for continued medical management at this, even while on dual antiplatelet therapy for recent placement of drug-eluting stent.   Past medical history also notable for history of type 2 diabetes mellitus as well as obstructive sleep apnea with associated reported good compliance with home nocturnal CPAP.    ED Course:  Vital signs in the ED were notable for the following: Temperature max 99.9; heart rate 90-96; blood pressure 116/67-120 9/66; respiratory rate 18-20, oxygen saturation 94% on room air.  Labs were notable for the following: CMP notable for the following: Sodium 131, which corrects to 136 when taking into account concomitant hyperglycemia, bicarbonate 25, creatinine 1.34 relative to most recent prior creatinine data point 0.83 on 02/03/2020, glucose 406.  CBC notable for white blood cell count of 16,300 with 86% neutrophils, hemoglobin 12.8.  Initial lactic acid 2.1, which trended  down to 1.5 following interval administration of IV fluids, as further described below.  COVID-19 PCR as well as influenza PCR found to  be negative.  Blood cultures x2 were collected prior to initiation of antibiotics.  Plain films of the left foot showed evidence of soft tissue swelling associated with the left third toe consistent with cellulitis in the absence of any evidence of osteomyelitis.  Ensuing MRI of the left foot patient tissue swelling consistent with cellulitis, but also showed no evidence of underlying osteomyelitis.  The patient's case was discussed with the on-call podiatrist, Dr. Luana Shu, who recommended admission to the hospital service for further evaluation and management of severe sepsis due to left lower extremity cellulitis, including initiation of broad-spectrum antibiotics, with plan for Dr. Vickki Muff of podiatry to evaluate the patient later today with additional recommendations to be made at that time. Dr. Luana Shu felt that it was not necessary at this time to make patient n.p.o., pending Dr. Alvera Singh anticipated ensuing evaluation.  While in the ED, the following were administered: IV Vanc and dose of Zosyn. IV NS  X 1 L bolus.     Review of Systems: As per HPI otherwise 10 point review of systems negative.   Past Medical History:  Diagnosis Date  . Asthma   . Coronary artery disease   . Depression   . Diabetes mellitus without complication ([place])   . Gout   . History anabolic steroid use   . Hyperlipidemia   . Hypertension   . Hypogonadism in male   . Morbid obesity ([place])   . Myocardial infarction ([place])   . Peripheral vascular disease ([place])   . Perirectal abscess   . Pleurisy   . Sleep apnea    CPAP at night, no oxygen  . Varicella     Past Surgical History:  Procedure Laterality Date  . ABDOMINAL AORTIC ANEURYSM REPAIR    . AMPUTATION TOE Right 02/10/2016   Procedure: AMPUTATION TOE 3RD TOE;  Surgeon: Samara Deist, DPM;  Location: [facility] ORS;  Service: Podiatry;  Laterality: Right;  . COLONOSCOPY WITH PROPOFOL [name] 11/18/2015   Procedure: COLONOSCOPY WITH PROPOFOL;  Surgeon: Manya Silvas, MD;  Location: [facility] ENDOSCOPY;  Service: Endoscopy;  Laterality: [name];  . CORONARY ARTERY BYPASS GRAFT    . CORONARY STENT INTERVENTION [name] 02/02/2020   Procedure: CORONARY STENT INTERVENTION;  Surgeon: Isaias Cowman, MD;  Location: [place] CV LAB;  Service: Cardiovascular;  Laterality: [name];  . KNEE ARTHROSCOPY    . LEFT HEART CATH AND CORS/GRAFTS ANGIOGRAPHY [name] 02/02/2020   Procedure: LEFT HEART CATH AND CORS/GRAFTS ANGIOGRAPHY;  Surgeon: Teodoro Spray, MD;  Location: [place] CV LAB;  Service: Cardiovascular;  Laterality: [name];  . PERIPHERAL VASCULAR CATHETERIZATION Right 01/24/2016   Procedure: Lower Extremity Angiography;  Surgeon: Katha Cabal, MD;  Location: [place] CV LAB;  Service: Cardiovascular;  Laterality: Right;  . PERIPHERAL VASCULAR CATHETERIZATION Right 01/25/2016   Procedure: Lower Extremity Angiography;  Surgeon: Katha Cabal, MD;  Location: [place] CV LAB;  Service: Cardiovascular;  Laterality: Right;  . TOE AMPUTATION    . TONSILLECTOMY      Social History:  reports that he quit smoking about 4 years ago. His smoking use included cigarettes. He has a 22.50 pack-year smoking history. He has never used smokeless tobacco. He reports previous alcohol use of about 3.0 standard drinks of alcohol per week. He reports that he does not use drugs.   No Known Allergies  No family  history on file.   Prior to Admission medications   Medication Sig Start Date End Date Taking? Authorizing Provider  albuterol (PROVENTIL) (2.5 MG/3ML) 0.083% nebulizer solution Take 2.5 mg by nebulization every 6 (six) hours as needed for wheezing or shortness of breath.    [provider]  apixaban (ELIQUIS) 5 MG TABS tablet Take 1 tablet (5 mg total) by mouth 2 (two) times daily. 01/26/16   Stegmayer, Janalyn Harder, PA-C  aspirin EC 81 MG tablet Take 1 tablet (81 mg total) by mouth daily. Swallow whole. 02/03/20 02/02/21  Teodoro Spray, MD    atorvastatin (LIPITOR) 40 MG tablet Take 1 tablet (40 mg total) by mouth daily. 02/03/20   Teodoro Spray, MD  clopidogrel (PLAVIX) 75 MG tablet Take 1 tablet (75 mg total) by mouth daily. 02/03/20 02/02/21  Teodoro Spray, MD  clopidogrel (PLAVIX) 75 MG tablet Take 1 tablet (75 mg total) by mouth daily with breakfast. 02/04/20   Teodoro Spray, MD  ferrous sulfate 325 (65 FE) MG tablet Take 325 mg by mouth daily with breakfast.    [provider]  furosemide (LASIX) 20 MG tablet Take 20 mg by mouth daily as needed for fluid or edema.     [provider]  ipratropium-albuterol (DUONEB) 0.5-2.5 (3) MG/3ML SOLN Take 3 mLs by nebulization every 6 (six) hours as needed. Patient not taking: Reported on 02/02/2020    [provider]  isosorbide mononitrate (IMDUR) 30 MG 24 hr tablet Take 1 tablet (30 mg total) by mouth daily. 02/04/20   Teodoro Spray, MD  Lactobacillus (ACIDOPHILUS) CAPS capsule Take 1 capsule by mouth daily.    [provider]  LANTUS SOLOSTAR 100 UNIT/ML Solostar Pen Inject 25 Units into the skin in the morning and at bedtime. 11/24/19   [provider]  losartan (COZAAR) 100 MG tablet Take 100 mg by mouth daily.    [provider]  MEDIUM CHAIN TRIGLYCERIDES PO Take 1,000 mg by mouth daily.    [provider]  metFORMIN (GLUCOPHAGE) 1000 MG tablet Take 1,000 mg by mouth 2 (two) times daily.  12/02/17   [provider]  metoprolol (LOPRESSOR) 50 MG tablet Take 50 mg by mouth 2 (two) times daily.    [provider]  Multiple Vitamins-Minerals (MULTIVITAMIN WITH MINERALS) tablet Take 1 tablet by mouth daily.    [provider]  nitroGLYCERIN (NITROSTAT) 0.4 MG SL tablet Place 0.4 mg under the tongue daily as needed. 01/25/20   [provider]  pramipexole (MIRAPEX) 1 MG tablet Take 1 mg by mouth at bedtime. 08/24/19   [provider]  primidone (MYSOLINE) 250 MG tablet Take 250 mg by  mouth 2 (two) times daily. 01/23/20   [provider]  tamsulosin (FLOMAX) 0.4 MG CAPS capsule Take 0.4 mg by mouth daily.    [provider]  vitamin B-12 (CYANOCOBALAMIN) 1000 MCG tablet Take 10,000 mcg by mouth daily. Energy shot    [provider]  zinc gluconate 50 MG tablet Take 50 mg by mouth daily.    [provider]     Objective    Physical Exam: Vitals:   02/23/20 0930 02/23/20 0931 02/23/20 1130 02/23/20 1215  BP: (!) 122/102  116/67 129/66  Pulse: 96  90 92  Resp: '20  18 19  ' Temp: 99.9 F (37.7 C)     TempSrc: Oral     SpO2: 94%  94% 94%  Weight:  133 kg  Height:  '6\' 4"'  (1.93 m)      General: appears to be stated age; alert, oriented Skin: warm, dry; also noted on plantar surface of left foot, with erythema over the dorsal surface of left foot and extending proximally over the anterior aspect of the left lower extremity, terminating distally to the left knee and associated with increased warmth and swelling. Head:  AT/[place] Eyes:  PEARL b/l, EOMI Mouth:  Oral mucosa membranes appear dry, normal dentition Neck: supple; trachea midline Heart:  RRR; did not appreciate any M/R/G Lungs: CTAB, did not appreciate any wheezes, rales, or rhonchi Abdomen: + BS; soft, ND, NT Extremities: Erythema, swelling, increased warmth associated with the left lower extremity, as further detailed below; discoloration of the left third toe also noted; no muscle wasting   Labs on Admission: I have personally reviewed following labs and imaging studies  CBC: Recent Labs  Lab 02/23/20 1058  WBC 16.3*  NEUTROABS 14.1*  HGB 12.8*  HCT 38.9*  MCV 92.6  PLT 916   Basic Metabolic Panel: Recent Labs  Lab 02/23/20 1058  NA 131*  K 4.6  CL 96*  CO2 25  GLUCOSE 406*  BUN 31*  CREATININE 1.34*  CALCIUM 8.4*   GFR: Estimated Creatinine Clearance: 81.9 mL/min (A) (by C-G formula based on SCr of 1.34 mg/dL (H)). Liver Function Tests: Recent Labs   Lab 02/23/20 1058  AST 17  ALT 19  ALKPHOS 83  BILITOT 0.9  PROT 6.7  ALBUMIN 3.1*   No results for input(s): LIPASE, AMYLASE in the last 168 hours. No results for input(s): AMMONIA in the last 168 hours. Coagulation Profile: No results for input(s): INR, PROTIME in the last 168 hours. Cardiac Enzymes: No results for input(s): CKTOTAL, CKMB, CKMBINDEX, TROPONINI in the last 168 hours. BNP (last 3 results) No results for input(s): PROBNP in the last 8760 hours. HbA1C: No results for input(s): HGBA1C in the last 72 hours. CBG: No results for input(s): GLUCAP in the last 168 hours. Lipid Profile: No results for input(s): CHOL, HDL, LDLCALC, TRIG, CHOLHDL, LDLDIRECT in the last 72 hours. Thyroid Function Tests: No results for input(s): TSH, T4TOTAL, FREET4, T3FREE, THYROIDAB in the last 72 hours. Anemia Panel: No results for input(s): VITAMINB12, FOLATE, FERRITIN, TIBC, IRON, RETICCTPCT in the last 72 hours. Urine analysis:    Component Value Date/Time   COLORURINE YELLOW (A) 01/06/2020 1853   APPEARANCEUR CLEAR (A) 01/06/2020 1853   LABSPEC 1.019 01/06/2020 1853   PHURINE 5.0 01/06/2020 1853   GLUCOSEU 50 (A) 01/06/2020 1853   HGBUR NEGATIVE 01/06/2020 1853   BILIRUBINUR NEGATIVE 01/06/2020 [place] NEGATIVE 01/06/2020 1853   PROTEINUR 100 (A) 01/06/2020 1853   NITRITE NEGATIVE 01/06/2020 1853   LEUKOCYTESUR NEGATIVE 01/06/2020 1853    Radiological Exams on Admission: DG Foot Complete Left  Result Date: 02/23/2020 CLINICAL DATA:  Left foot infection with drainage EXAM: LEFT FOOT - COMPLETE 3+ VIEW COMPARISON:  None. FINDINGS: No acute fracture or dislocation. Are no focal bony erosion is identified. There is a probable plantar ulceration in the region of the third MTP joint. There is soft tissue swelling of the third digit with extensive gas within the soft tissues extending from the level of the distal phalanx to the third metatarsal head. Diffuse soft tissue  swelling of the forefoot. IMPRESSION: 1. No specific radiographic evidence of osteomyelitis. 2. Soft tissue swelling of the left third toe with extensive gas within the soft tissues, concerning for infection with a gas-forming  organism. Electronically Signed   By: Davina Poke D.O.   On: 02/23/2020 10:09     Assessment/Plan   [name]. is a 64 y.o. male with medical history significant for peripheral artery disease on chronic Eliquis therapy, coronary artery disease status post PCI with drug-eluting stent to the mid RCA on 02/03/2020, type 2 diabetes mellitus, hypertension, obstructive sleep apnea compliant on home nocturnal CPAP, who is admitted to [facility] on 02/23/2020 with severe sepsis due to left lower extremity cellulitis after presenting from home to [facility] Emergency Department complaining of left lower extremity erythema.    Principal Problem:   Cellulitis of left lower extremity Active Problems:   Diabetes mellitus type 2 in obese (HCC)   PAD (peripheral artery disease) (HCC)   CAD (coronary artery disease)   Severe sepsis (HCC)   AKI (acute kidney injury) ([place])    #) Severe sepsis due to cellulitis: Diagnosis on the basis of left lower extremity erythema, swelling, and increased warmth, with subsequent plain films and MRI showing evidence of subcutaneous swelling consistent with cellulitis in the absence of any evidence of acute osteomyelitis.  Additionally, no finding of crepitus to suggest necrotizing fasciitis.  This is in the context of documented history of peripheral artery disease resulting in prior amputation of the right third toe.  Portal of entry appears ulcer noted on the plantar surface of the left foot.  SIRS criteria met with presenting leukocytosis as well as tachycardia.  Criteria met for patient sepsis to be considered severe in nature on the basis of concomitant evidence of endorgan damage in the form of elevated  initial lactic acid as well as presenting acute kidney injury.  Of note, in the absence of an elevated lactic acid greater than 4 or the presence of hypotension, there is an average.  The present time for initiation of 30 mL per cake IV fluid bolus.  In the setting of severe sepsis, the patient cellulitis is classified as severe in quality, and in the absence of any overt purulent discharge warrants broad-spectrum antibiotics, including provision of MRSA coverage as well as antipseudomonal coverage.  Blood cultures x2 were collected in the ED this evening prior to initiation of IV vancomycin and Zosyn.  Of note following interval IV fluids and initiation of antibiotics, initial beta lactic acid has normalized to 1.5.   Plan: Monitor for results of blood cultures x2.  Continue IV vancomycin and Zosyn.  Repeat CBC with differential in the morning.  Podiatry formally consulted, with plan for the patient to be evaluated by Dr. Vickki Muff later today, with additional recommendations to be rendered at that time, including recommendations regarding the necessity of taking the patient to the OR, particularly to address the discolored appearing left third toe.  Check INR.     #) Acute kidney injury: Presenting creatinine noted to be 1.34 relative to most recent prior value of 0.823 when checked on 02/03/2020.  Appears to be prerenal in nature the basis of intravascular depletion stemming from concomitant severe sepsis, with potential pharmacologic exacerbation from outpatient losartan.  Plan: Gentle IV fluids.  Work-up and management of presenting severe sepsis due to left lower extremity cellulitis as above.  Urinalysis with microscopy to evaluate presence of urinary casts.  We will also check random urine sodium as well as random urine creatinine.  Monitor strict I's and O's and daily weights.  Attempt to avoid nephrotoxic agents.  Repeat BMP.  Hold home losartan      #)  Obstructive sleep apnea: The patient  reports good compliance with his home nocturnal CPAP.  Plan: I have placed a respiratory therapy order for provision of hospital and CPAP machine for nocturnal use at the patient's home setting during this hospitalization.      #) Type 2 diabetes mellitus: Outpatient diabetic regimen includes Metformin as well as Lantus 26 units twice daily, in the absence of any short acting insulin.. With home check hemoglobin A1c due to the importance of optimization glycemic management in order to promote improved healing of her extremity ulcerative lesions as well as reduce risk for subsequent cellulitis.   Plan: Check hemoglobin A1c.  Lantus 12 units subcu twice daily, first dose now.  Accu-Cheks before every meal and at bedtime with associated moderate dose sliding scale insulin.  We will hold her Metformin during this hospitalization     #) Peripheral artery disease: Patient reports that he is not Eliquis therapy, and was instructed to continue his Eliquis even while on dual antiplatelet therapy for recent drug-eluting stent to the mid RCA.  Plan: Continue home Eliquis.     #) Coronary artery disease: patient underwent coronary angiography on 02/03/2020 which revealed 90% stenosis of the mid RCA which time he underwent PCI with drug-eluting stent placement to the mid RCA.  Per my review of the ensuing cardiology note, the patient is to remain on on dual antiplatelet therapy without interruption for the next 1 year.  Cardiac regimen also includes Lopressor and high intensity atorvastatin.  The patient denies any recent chest pain.  Plan: Continue dual antiplatelet therapy, as above.  Continue home as well as atorvastatin.      #) BPH: On Flomax as an outpatient.  Plan: Continue.  Monitor strict I's and O's and daily weights.  Repeat BMP in the morning.     DVT prophylaxis: Continue home Eliquis Code Status: Full code Family Communication: The patient's case was discussed with his wife,  who is present at bedside. Disposition Plan: Per Rounding Team Consults called: Case was discussed with the on-call podiatrist, Dr. Luana Shu, with plan for Dr. Vickki Muff of podiatry to evaluate the patient in person later today. Admission status: Inpatient; med telemetry.    PLEASE NOTE THAT DRAGON DICTATION SOFTWARE WAS USED IN THE CONSTRUCTION OF THIS NOTE.   [facility] Pager [phone] From 12PM- 12AM  Otherwise, please contact night-coverage  [email or web address] Password [email or web address]  02/23/2020, 1:41 PM

## 2020-02-23 NOTE — Progress Notes (Signed)
Pt refused to wear our CPAP. Pt is aware that if he were to change his mind a CPAP can be made available to him. RN aware as well.

## 2020-02-24 ENCOUNTER — Other Ambulatory Visit (INDEPENDENT_AMBULATORY_CARE_PROVIDER_SITE_OTHER): Payer: Self-pay | Admitting: Vascular Surgery

## 2020-02-24 ENCOUNTER — Encounter: Payer: Self-pay | Admitting: Internal Medicine

## 2020-02-24 ENCOUNTER — Inpatient Hospital Stay: Payer: PPO | Admitting: Anesthesiology

## 2020-02-24 ENCOUNTER — Encounter
Admission: EM | Disposition: A | Discharge status: Home Health | Payer: Self-pay | Source: Home / Self Care | Attending: Internal Medicine

## 2020-02-24 DIAGNOSIS — A419 Sepsis, unspecified organism: Secondary | ICD-10-CM | POA: Diagnosis present

## 2020-02-24 DIAGNOSIS — N179 Acute kidney failure, unspecified: Secondary | ICD-10-CM | POA: Diagnosis present

## 2020-02-24 HISTORY — PX: IRRIGATION AND DEBRIDEMENT FOOT: SHX6602

## 2020-02-24 HISTORY — PX: AMPUTATION TOE: SHX6595

## 2020-02-24 LAB — HEMOGLOBIN A1C
Hgb A1c MFr Bld: 9.7 % — ABNORMAL HIGH (ref 4.8–5.6)
Mean Plasma Glucose: 232 mg/dL

## 2020-02-24 LAB — CBC WITH DIFFERENTIAL/PLATELET
Abs Immature Granulocytes: 0.18 10*3/uL — ABNORMAL HIGH (ref 0.00–0.07)
Basophils Absolute: 0.1 10*3/uL (ref 0.0–0.1)
Basophils Relative: 0 %
Eosinophils Absolute: 0.1 10*3/uL (ref 0.0–0.5)
Eosinophils Relative: 1 %
HCT: 34 % — ABNORMAL LOW (ref 39.0–52.0)
Hemoglobin: 11.4 g/dL — ABNORMAL LOW (ref 13.0–17.0)
Immature Granulocytes: 1 %
Lymphocytes Relative: 6 %
Lymphs Abs: 0.9 10*3/uL (ref 0.7–4.0)
MCH: 30.8 pg (ref 26.0–34.0)
MCHC: 33.5 g/dL (ref 30.0–36.0)
MCV: 91.9 fL (ref 80.0–100.0)
Monocytes Absolute: 1.1 10*3/uL — ABNORMAL HIGH (ref 0.1–1.0)
Monocytes Relative: 8 %
Neutro Abs: 12 10*3/uL — ABNORMAL HIGH (ref 1.7–7.7)
Neutrophils Relative %: 84 %
Platelets: 155 10*3/uL (ref 150–400)
RBC: 3.7 MIL/uL — ABNORMAL LOW (ref 4.22–5.81)
RDW: 13.2 % (ref 11.5–15.5)
WBC: 14.3 10*3/uL — ABNORMAL HIGH (ref 4.0–10.5)
nRBC: 0 % (ref 0.0–0.2)

## 2020-02-24 LAB — PROTIME-INR
INR: 1.3 — ABNORMAL HIGH (ref 0.8–1.2)
Prothrombin Time: 15.9 seconds — ABNORMAL HIGH (ref 11.4–15.2)

## 2020-02-24 LAB — COMPREHENSIVE METABOLIC PANEL
ALT: 16 U/L (ref 0–44)
AST: 15 U/L (ref 15–41)
Albumin: 2.6 g/dL — ABNORMAL LOW (ref 3.5–5.0)
Alkaline Phosphatase: 75 U/L (ref 38–126)
Anion gap: 8 (ref 5–15)
BUN: 33 mg/dL — ABNORMAL HIGH (ref 8–23)
CO2: 24 mmol/L (ref 22–32)
Calcium: 7.8 mg/dL — ABNORMAL LOW (ref 8.9–10.3)
Chloride: 98 mmol/L (ref 98–111)
Creatinine, Ser: 1.32 mg/dL — ABNORMAL HIGH (ref 0.61–1.24)
GFR, Estimated: 60 mL/min — ABNORMAL LOW (ref >60)
Glucose, Bld: 387 mg/dL — ABNORMAL HIGH (ref 70–99)
Potassium: 4.1 mmol/L (ref 3.5–5.1)
Sodium: 130 mmol/L — ABNORMAL LOW (ref 135–145)
Total Bilirubin: 0.7 mg/dL (ref 0.3–1.2)
Total Protein: 5.8 g/dL — ABNORMAL LOW (ref 6.5–8.1)

## 2020-02-24 LAB — SURGICAL PCR SCREEN
MRSA, PCR: NEGATIVE
Staphylococcus aureus: NEGATIVE

## 2020-02-24 LAB — GLUCOSE, CAPILLARY
Glucose-Capillary: 349 mg/dL — ABNORMAL HIGH (ref 70–99)
Glucose-Capillary: 337 mg/dL — ABNORMAL HIGH (ref 70–99)
Glucose-Capillary: 288 mg/dL — ABNORMAL HIGH (ref 70–99)
Glucose-Capillary: 296 mg/dL — ABNORMAL HIGH (ref 70–99)

## 2020-02-24 LAB — MAGNESIUM: Magnesium: 1.8 mg/dL (ref 1.7–2.4)

## 2020-02-24 LAB — HIV ANTIBODY (ROUTINE TESTING W REFLEX): HIV Screen 4th Generation wRfx: NONREACTIVE

## 2020-02-24 SURGERY — IRRIGATION AND DEBRIDEMENT FOOT
Anesthesia: General | Site: Toe | Laterality: Left

## 2020-02-24 MED ORDER — FENTANYL CITRATE (PF) 100 MCG/2ML IJ SOLN: 25.0000 ug | INTRAMUSCULAR | Status: DC | PRN

## 2020-02-24 MED ORDER — CHLORHEXIDINE GLUCONATE 4 % EX LIQD
60.0000 mL | Freq: Once | CUTANEOUS | Status: AC
  Administered 2020-02-24: 4 via TOPICAL

## 2020-02-24 MED ORDER — FENTANYL CITRATE (PF) 100 MCG/2ML IJ SOLN
INTRAMUSCULAR | Status: AC
  Filled 2020-02-24: qty 2

## 2020-02-24 MED ORDER — POVIDONE-IODINE 10 % EX SWAB: 2.0000 "application " | Freq: Once | CUTANEOUS | Status: DC

## 2020-02-24 MED ORDER — DEXAMETHASONE SODIUM PHOSPHATE 10 MG/ML IJ SOLN
INTRAMUSCULAR | Status: AC
  Filled 2020-02-24: qty 1

## 2020-02-24 MED ORDER — MIDAZOLAM HCL 2 MG/2ML IJ SOLN
INTRAMUSCULAR | Status: AC
  Filled 2020-02-24: qty 2

## 2020-02-24 MED ORDER — LIDOCAINE HCL (PF) 1 % IJ SOLN
INTRAMUSCULAR | Status: DC | PRN
  Administered 2020-02-24: 20 mL

## 2020-02-24 MED ORDER — FENTANYL CITRATE (PF) 100 MCG/2ML IJ SOLN
25.0000 ug | INTRAMUSCULAR | Status: DC | PRN
  Administered 2020-02-24: 25 ug via INTRAVENOUS

## 2020-02-24 MED ORDER — ONDANSETRON HCL 4 MG/2ML IJ SOLN
INTRAMUSCULAR | Status: AC
  Filled 2020-02-24: qty 2

## 2020-02-24 MED ORDER — LIDOCAINE HCL (PF) 2 % IJ SOLN
INTRAMUSCULAR | Status: AC
  Filled 2020-02-24: qty 5

## 2020-02-24 MED ORDER — PROPOFOL 500 MG/50ML IV EMUL
INTRAVENOUS | Status: AC
  Filled 2020-02-24: qty 50

## 2020-02-24 MED ORDER — INSULIN GLARGINE 100 UNIT/ML ~~LOC~~ SOLN
20.0000 [IU] | Freq: Two times a day (BID) | SUBCUTANEOUS | Status: DC
  Administered 2020-02-24: 20 [IU] via SUBCUTANEOUS
  Filled 2020-02-24 (×4): qty 0.2

## 2020-02-24 MED ORDER — FENTANYL CITRATE (PF) 100 MCG/2ML IJ SOLN
INTRAMUSCULAR | Status: DC | PRN
  Administered 2020-02-24 (×2): 25 ug via INTRAVENOUS
  Administered 2020-02-24 (×2): 50 ug via INTRAVENOUS

## 2020-02-24 MED ORDER — PROPOFOL 10 MG/ML IV BOLUS
INTRAVENOUS | Status: DC | PRN
  Administered 2020-02-24 (×2): 40 mg via INTRAVENOUS
  Administered 2020-02-24: 20 mg via INTRAVENOUS
  Administered 2020-02-24: 40 mg via INTRAVENOUS

## 2020-02-24 MED ORDER — VANCOMYCIN HCL 1000 MG IV SOLR
INTRAVENOUS | Status: DC | PRN
  Administered 2020-02-24: 1000 mg via TOPICAL

## 2020-02-24 MED ORDER — ONDANSETRON HCL 4 MG/2ML IJ SOLN: 4.0000 mg | Freq: Once | INTRAMUSCULAR | Status: DC | PRN

## 2020-02-24 MED ORDER — KETAMINE HCL 50 MG/ML IJ SOLN
INTRAMUSCULAR | Status: DC | PRN
  Administered 2020-02-24: 15 mg via INTRAMUSCULAR
  Administered 2020-02-24: 10 mg via INTRAMUSCULAR

## 2020-02-24 MED ORDER — SODIUM CHLORIDE 0.9 % IV SOLN: INTRAVENOUS | Status: DC

## 2020-02-24 MED ORDER — ONDANSETRON HCL 4 MG/2ML IJ SOLN
INTRAMUSCULAR | Status: DC | PRN
  Administered 2020-02-24: 4 mg via INTRAVENOUS

## 2020-02-24 MED ORDER — KETAMINE HCL 50 MG/ML IJ SOLN
INTRAMUSCULAR | Status: AC
  Filled 2020-02-24: qty 10

## 2020-02-24 MED ORDER — PROPOFOL 500 MG/50ML IV EMUL
INTRAVENOUS | Status: DC | PRN
  Administered 2020-02-24: 25 ug/kg/min via INTRAVENOUS

## 2020-02-24 SURGICAL SUPPLY — 60 items
BAG COUNTER SPONGE EZ (MISCELLANEOUS) ×2 IMPLANT
BLADE OSC/SAGITTAL MD 5.5X18 (BLADE) IMPLANT
BLADE OSCILLATING/SAGITTAL (BLADE)
BLADE SW THK.38XMED LNG THN (BLADE) IMPLANT
BNDG CONFORM 2 STRL LF (GAUZE/BANDAGES/DRESSINGS) IMPLANT
BNDG CONFORM 3 STRL LF (GAUZE/BANDAGES/DRESSINGS) IMPLANT
BNDG ELASTIC 3X5.8 VLCR NS LF (GAUZE/BANDAGES/DRESSINGS) IMPLANT
BNDG ELASTIC 4X5.8 VLCR NS LF (GAUZE/BANDAGES/DRESSINGS) IMPLANT
BNDG ESMARK 4X12 TAN STRL LF (GAUZE/BANDAGES/DRESSINGS) ×4 IMPLANT
BNDG GAUZE 4.5X4.1 6PLY STRL (MISCELLANEOUS) ×4 IMPLANT
CANISTER SUCT 1200ML W/VALVE (MISCELLANEOUS) ×2 IMPLANT
CANISTER SUCT 3000ML PPV (MISCELLANEOUS) IMPLANT
COUNTER SPONGE BAG EZ (MISCELLANEOUS)
COVER WAND RF STERILE (DRAPES) ×4 IMPLANT
CUFF TOURN SGL QUICK 12 (TOURNIQUET CUFF) IMPLANT
CUFF TOURN SGL QUICK 18X4 (TOURNIQUET CUFF) ×2 IMPLANT
DRAPE FLUOR MINI C-ARM 54X84 (DRAPES) IMPLANT
DURAPREP 26ML APPLICATOR (WOUND CARE) ×2 IMPLANT
ELECT REM PT RETURN 9FT ADLT (ELECTROSURGICAL) ×4
ELECTRODE REM PT RTRN 9FT ADLT (ELECTROSURGICAL) ×2 IMPLANT
GAUZE PACKING 1/4 X5 YD (GAUZE/BANDAGES/DRESSINGS) IMPLANT
GAUZE PACKING IODOFORM 1/2 (PACKING) ×2 IMPLANT
GAUZE PACKING IODOFORM 1X5 (PACKING) IMPLANT
GAUZE SPONGE 4X4 12PLY STRL (GAUZE/BANDAGES/DRESSINGS) ×4 IMPLANT
GAUZE XEROFORM 1X8 LF (GAUZE/BANDAGES/DRESSINGS) ×4 IMPLANT
GLOVE BIO SURGEON STRL SZ7 (GLOVE) ×4 IMPLANT
GLOVE INDICATOR 7.0 STRL GRN (GLOVE) ×4 IMPLANT
GOWN STRL REUS W/ TWL LRG LVL3 (GOWN DISPOSABLE) ×4 IMPLANT
GOWN STRL REUS W/TWL LRG LVL3 (GOWN DISPOSABLE) ×4
HANDPIECE VERSAJET DEBRIDEMENT (MISCELLANEOUS) IMPLANT
IV NS 1000ML (IV SOLUTION)
IV NS 1000ML BAXH (IV SOLUTION) IMPLANT
KIT TURNOVER KIT A (KITS) ×4 IMPLANT
LABEL OR SOLS (LABEL) IMPLANT
NDL FILTER BLUNT 18X1 1/2 (NEEDLE) ×2 IMPLANT
NDL HYPO 25X1 1.5 SAFETY (NEEDLE) ×4 IMPLANT
NEEDLE FILTER BLUNT 18X 1/2SAF (NEEDLE) ×2
NEEDLE FILTER BLUNT 18X1 1/2 (NEEDLE) ×2 IMPLANT
NEEDLE HYPO 25X1 1.5 SAFETY (NEEDLE) ×8 IMPLANT
NS IRRIG 500ML POUR BTL (IV SOLUTION) ×4 IMPLANT
PACK EXTREMITY (MISCELLANEOUS) ×4 IMPLANT
PAD ABD DERMACEA PRESS 5X9 (GAUZE/BANDAGES/DRESSINGS) ×4 IMPLANT
PULSAVAC PLUS IRRIG FAN TIP (DISPOSABLE) ×4
RASP SM TEAR CROSS CUT (RASP) IMPLANT
SOL .9 NS 3000ML IRR  AL (IV SOLUTION)
SOL .9 NS 3000ML IRR UROMATIC (IV SOLUTION) IMPLANT
SOL PREP PVP 2OZ (MISCELLANEOUS) ×4
SOLUTION PREP PVP 2OZ (MISCELLANEOUS) IMPLANT
STOCKINETTE STRL 6IN 960660 (GAUZE/BANDAGES/DRESSINGS) ×4 IMPLANT
SUT ETHILON 3-0 FS-10 30 BLK (SUTURE) ×12
SUT ETHILON 4-0 (SUTURE)
SUT ETHILON 4-0 FS2 18XMFL BLK (SUTURE)
SUT VIC AB 3-0 SH 27 (SUTURE) ×2
SUT VIC AB 3-0 SH 27X BRD (SUTURE) IMPLANT
SUT VIC AB 4-0 FS2 27 (SUTURE) IMPLANT
SUTURE EHLN 3-0 FS-10 30 BLK (SUTURE) IMPLANT
SUTURE ETHLN 4-0 FS2 18XMF BLK (SUTURE) IMPLANT
SWAB CULTURE AMIES ANAERIB BLU (MISCELLANEOUS) ×2 IMPLANT
SYR 10ML LL (SYRINGE) ×4 IMPLANT
TIP FAN IRRIG PULSAVAC PLUS (DISPOSABLE) IMPLANT

## 2020-02-24 NOTE — Progress Notes (Addendum)
Inpatient Diabetes Program Recommendations  AACE/ADA: New Consensus Statement on Inpatient Glycemic Control (2015)  Target Ranges:  Prepandial:   less than 140 mg/dL      Peak postprandial:   less than 180 mg/dL (1-2 hours)      Critically ill patients:  140 - 180 mg/dL   Lab Results  Component Value Date   GLUCAP 349 (H) 02/24/2020    Review of Glycemic Control Results for Sickinger, CREED KAIL (MRN [id number]) as of 02/24/2020 10:42  Ref. Range 02/03/2020 11:43 02/23/2020 18:20 02/23/2020 21:14 02/24/2020 07:45  Glucose-Capillary Latest Ref Range: 70 - 99 mg/dL 323 (H) 322 (H) 305 (H) 349 (H)   Diabetes history: DM 2 Outpatient Diabetes medications:  Lantus 26 units bid Metformin 1000 mg bid Current orders for Inpatient glycemic control:  Novolog moderate tid with meals Lantus 12 units bid Inpatient Diabetes Program Recommendations:   Please consider increasing Lantus to 25 units bid and also increase Novolog correction to q 4 hours (while NPO). A1C pending.  Thanks  Adah Perl, RN, BC-ADM Inpatient Diabetes Coordinator Pager [phone] (8a-5p)

## 2020-02-24 NOTE — Anesthesia Procedure Notes (Signed)
Date/Time: 02/24/2020 5:25 PM Performed by: Johnna Acosta, CRNA Pre-anesthesia Checklist: Patient identified, Emergency Drugs available, Suction available, Patient being monitored and Timeout performed Patient Re-evaluated:Patient Re-evaluated prior to induction Oxygen Delivery Method: Simple face mask Preoxygenation: Pre-oxygenation with 100% oxygen Induction Type: IV induction

## 2020-02-24 NOTE — Plan of Care (Signed)
°  Problem: Coping: °Goal: Level of anxiety will decrease °Outcome: Progressing °  °

## 2020-02-24 NOTE — Anesthesia Postprocedure Evaluation (Signed)
Anesthesia Post Note  Patient: [name].  Procedure(s) Performed: IRRIGATION AND DEBRIDEMENT FOOT (Left Foot) AMPUTATION TOE (Left Toe)  Patient location during evaluation: PACU Anesthesia Type: General Level of consciousness: awake and alert Pain management: pain level controlled Vital Signs Assessment: post-procedure vital signs reviewed and stable Respiratory status: spontaneous breathing, nonlabored ventilation, respiratory function stable and patient connected to nasal cannula oxygen Cardiovascular status: blood pressure returned to baseline and stable Postop Assessment: no apparent nausea or vomiting Anesthetic complications: no   No complications documented.   Last Vitals:  Vitals:   02/24/20 1929 02/24/20 1959  BP: 105/62 116/67  Pulse: 84 87  Resp: 17 18  Temp: 36.8 C 36.8 C  SpO2: 94% 96%    Last Pain:  Vitals:   02/24/20 1959  TempSrc: Oral  PainSc:                  Martha Clan

## 2020-02-24 NOTE — Op Note (Signed)
PODIATRY / FOOT AND ANKLE SURGERY OPERATIVE REPORT    SURGEON: Caroline More, DPM  PRE-OPERATIVE DIAGNOSIS:  1. 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 Left foot cellulitis 3. DM2 with polyneuropathy, uncontrolled 4. PVD  POST-OPERATIVE DIAGNOSIS: same  PROCEDURE(S): 1. L partial 3rd ray amputation  HEMOSTASIS: L ankle tourniquet  ANESTHESIA: MAC  ESTIMATED BLOOD LOSS: 30 cc  FINDING(S): 1.  Gas gangrene L 3rd toe with necrosis of tissue  PATHOLOGY/SPECIMEN(S):  L 3rd ray with proximal margin marked in ink - path.  Wound/bone culture L 3rd MTPJ/digit  INDICATIONS:   [name]. is a 64 y.o. male who presents with a nonhealing ulceration to the left third metatarsal plantar with cellulitic changes and purulent drainage as well as discoloration to the third toe.  Patient was seen by Dr. Vickki Muff in clinic and was noted to have notable signs of infection and cellulitis to the foot.  Patient was sent to the emergency room for further evaluation and treatment as well as antibiotic therapy and potential surgical intervention.  X-ray and MRI imaging revealed gas in the soft tissues about the third toe but did not show any signs of osteomyelitis.  Discussed all treatment options with patient both conservative and surgical attempts at correction at this time patient is elected for procedure consisting of left partial third ray amputation due to the amount of infection present.  Vascular is also been consulted and planning for an angiogram on 02/25/2020.  DESCRIPTION: After obtaining full informed written consent, the patient was brought back to the operating room and placed supine upon the operating table.  The patient received IV antibiotics prior to induction.  After obtaining adequate anesthesia, 20 cc of half percent Marcaine plain was injected about the left third ray in a ray type block.  The patient was prepped and draped in the standard fashion.  Left ankle tourniquet was  applied.  At this time a 3-1 type of elliptical incision was performed around the third digit with arms extending proximally both dorsally and plantarly such that the plantar ulceration was also excised on the bottom of the third metatarsal phalangeal joint.  The incision was made straight to bone.  A fairly substantial amount of bleeding occurred during this so the Esmarch bandage was used to exsanguinate the left lower extremity and the pneumatic left ankle tourniquet was inflated.  The incision was made straight down to bone.  At this time the third metatarsal phalangeal joint was identified and extensor tenotomy and capsulotomy was performed followed by release the collateral and suspensory ligaments as well as plantar plate and flexor tendon.  The third digit was disarticulated and passed off the operative site.  Circumferential dissection was then performed around the third metatarsal head to the midshaft portion.  The sagittal bone saw was then used to resect the third metatarsal at the midshaft and it was passed off the operative site.  The proximal margin was marked in ink and both the third digit and third metatarsal distal were passed off and sent off for pathology.  A wound culture was also taken and passed off and sent off to the lab.  The wound was then probed in multiple directions through the flexor and extensor tendon sheaths and no further purulence was able to be expressed.  The surgical site was then flushed with copious amounts normal sterile saline after debridement was performed with curette of soft tissues with pulse lavage 3 L.  Vancomycin powder was then sprinkled  into the wound.  Some deep closure was obtained with 3-0 Vicryl, the skin was then reapproximated well coapted partially with 3-0 nylon in a combination of simple and horizontal mattress type stitching.  The wound was also packed with half-inch iodoform packing gauze.  The pneumatic ankle tourniquet was deflated and a prompt  hyperemic response was noted all digits left foot.  Hemostasis appeared to be well under control.  Postoperative dressing was then applied consisting of Xeroform followed by 4 x 4 gauze, ABD, Kerlix, Ace wrap.  Patient tolerated procedure and anesthesia well was transferred to recovery room vital signs stable vascular status intact all toes left foot.  Patient was discharged back to the inpatient room with the appropriate orders.  Patient remain nonweightbearing to left lower extremity at all times.  Patient to continue with IV antibiotic therapy based on culture results.  COMPLICATIONS: none  CONDITION: good, stable  Caroline More, DPM

## 2020-02-24 NOTE — Progress Notes (Addendum)
PROGRESS NOTE    [name].  YBO:[id number] DOB: 22-Jan-1956 DOA: 02/23/2020 PCP: [name], MD    Brief Narrative:   [name]. is a 64 y.o. male with medical history significant for peripheral artery disease on chronic Eliquis therapy, coronary artery disease status post PCI with drug-eluting stent to the mid RCA on 02/03/2020, type 2 diabetes mellitus, hypertension, obstructive sleep apnea compliant on home nocturnal CPAP, who is admitted to [facility] on 02/23/2020 with severe sepsis due to left lower extremity cellulitis after presenting from home to [name] Emergency Department complaining of left lower extremity erythema.  The patient confirms that he underwent coronary angiography on 02/03/2020 which revealed 90% stenosis of the mid RCA which time he underwent PCI with drug-eluting stent placement to the mid RCA.  Per my review of the ensuing cardiology note, the patient is to remain on on dual antiplatelet therapy without interruption for the next 1 year.  Denies any ensuing chest pain, shortness of breath, palpitations, diaphoresis, dizziness, presyncope, or syncope  Consultants:    podiatry, vascular surgery  Procedures:   Antimicrobials:   zosyn  Vancomycin  Subjective: States feels cold. No chills. Afebrile. Wife at bedside. Does report the erythema of his left lower extremity looks better.  Objective: Vitals:   02/23/20 2030 02/23/20 2332 02/24/20 0521 02/24/20 0744  BP: (!) 107/58 121/67 118/62 (!) 120/55  Pulse: (!) 106 96 (!) 101 (!) 104  Resp: 18 17 18 18   Temp: 98.8 F (37.1 C) 98.3 F (36.8 C) 99.1 F (37.3 C) 98.5 F (36.9 C)  TempSrc: Oral Oral Oral Oral  SpO2: 94% 98% 95% 94%  Weight:   (!) 139.7 kg   Height:        Intake/Output Summary (Last 24 hours) at 02/24/2020 0851 Last data filed at 02/24/2020 2778 Gross per 24 hour  Intake 294.46 ml  Output --  Net 294.46 ml   Filed Weights    02/23/20 0931 02/24/20 0521  Weight: 133 kg (!) 139.7 kg    Examination:  General exam: Appears calm and comfortable  Respiratory system: Clear to auscultation. Respiratory effort normal. Cardiovascular system: S1 & S2 heard, RRR. No JVD, murmurs, rubs, gallops or clicks. No pedal edema. Gastrointestinal system: Abdomen is nondistended, soft and nontender. . Normal bowel sounds heard. Central nervous system: Alert and oriented. Grossly intact Extremities: Bilateral lower extremity edema, right lower extremity chronic venous stasis with hyperpigmentation warm to touch. Left lower extremity with erythema, warmth and swelling upper part of shins. +venous statis Skin: Warm dry Psychiatry: Judgement and insight appear normal. Mood & affect appropriate.     Data Reviewed: I have personally reviewed following labs and imaging studies  CBC: Recent Labs  Lab 02/23/20 1058 02/24/20 0429  WBC 16.3* 14.3*  NEUTROABS 14.1* 12.0*  HGB 12.8* 11.4*  HCT 38.9* 34.0*  MCV 92.6 91.9  PLT 161 242   Basic Metabolic Panel: Recent Labs  Lab 02/23/20 1058 02/24/20 0429  NA 131* 130*  K 4.6 4.1  CL 96* 98  CO2 25 24  GLUCOSE 406* 387*  BUN 31* 33*  CREATININE 1.34* 1.32*  CALCIUM 8.4* 7.8*  MG 1.8 1.8   GFR: Estimated Creatinine Clearance: 85.2 mL/min (A) (by C-G formula based on SCr of 1.32 mg/dL (H)). Liver Function Tests: Recent Labs  Lab 02/23/20 1058 02/24/20 0429  AST 17 15  ALT 19 16  ALKPHOS 83 75  BILITOT 0.9 0.7  PROT  6.7 5.8*  ALBUMIN 3.1* 2.6*   No results for input(s): LIPASE, AMYLASE in the last 168 hours. No results for input(s): AMMONIA in the last 168 hours. Coagulation Profile: Recent Labs  Lab 02/24/20 0429  INR 1.3*   Cardiac Enzymes: No results for input(s): CKTOTAL, CKMB, CKMBINDEX, TROPONINI in the last 168 hours. BNP (last 3 results) No results for input(s): PROBNP in the last 8760 hours. HbA1C: No results for input(s): HGBA1C in the last 72  hours. CBG: Recent Labs  Lab 02/23/20 1820 02/23/20 2114 02/24/20 0745  GLUCAP 322* 305* 349*   Lipid Profile: No results for input(s): CHOL, HDL, LDLCALC, TRIG, CHOLHDL, LDLDIRECT in the last 72 hours. Thyroid Function Tests: No results for input(s): TSH, T4TOTAL, FREET4, T3FREE, THYROIDAB in the last 72 hours. Anemia Panel: No results for input(s): VITAMINB12, FOLATE, FERRITIN, TIBC, IRON, RETICCTPCT in the last 72 hours. Sepsis Labs: Recent Labs  Lab 02/23/20 1058 02/23/20 1454  LATICACIDVEN 2.1* 1.5    Recent Results (from the past 240 hour(s))  Culture, blood (routine x 2)     Status: None (Preliminary result)   Collection Time: 02/23/20 10:58 AM   Specimen: BLOOD  Result Value Ref Range Status   Specimen Description BLOOD BLOOD RIGHT FOREARM  Final   Special Requests   Final    BOTTLES DRAWN AEROBIC AND ANAEROBIC Blood Culture adequate volume   Culture   Final    NO GROWTH < 24 HOURS Performed at [facility], [address], [place], [place] [address]    Report Status PENDING  Incomplete  Culture, blood (routine x 2)     Status: None (Preliminary result)   Collection Time: 02/23/20 10:58 AM   Specimen: BLOOD  Result Value Ref Range Status   Specimen Description BLOOD Blood Culture adequate volume  Final   Special Requests   Final    BOTTLES DRAWN AEROBIC AND ANAEROBIC LEFT ANTECUBITAL   Culture   Final    NO GROWTH < 24 HOURS Performed at [facility], [address], [place],  [address]    Report Status PENDING  Incomplete  Respiratory Panel by RT PCR (Flu A&B, Covid) - Nasopharyngeal Swab     Status: None   Collection Time: 02/23/20 11:59 AM   Specimen: Nasopharyngeal Swab  Result Value Ref Range Status   SARS Coronavirus 2 by RT PCR NEGATIVE NEGATIVE Final    Comment: (NOTE) SARS-CoV-2 target nucleic acids are NOT DETECTED.  The SARS-CoV-2 RNA is generally detectable in upper respiratoy specimens during the acute phase of  infection. The lowest concentration of SARS-CoV-2 viral copies this assay can detect is 131 copies/mL. A negative result does not preclude SARS-Cov-2 infection and should not be used as the sole basis for treatment or other patient management decisions. A negative result may occur with  improper specimen collection/handling, submission of specimen other than nasopharyngeal swab, presence of viral mutation(s) within the areas targeted by this assay, and inadequate number of viral copies (<131 copies/mL). A negative result must be combined with clinical observations, patient history, and epidemiological information. The expected result is Negative.  Fact Sheet for Patients:  PinkCheek.be  Fact Sheet for Healthcare Providers:  GravelBags.it  This test is no t yet approved or cleared by the [place] FDA and  has been authorized for detection and/or diagnosis of SARS-CoV-2 by FDA under an Emergency Use Authorization (EUA). This EUA will remain  in effect (meaning this test can be used) for the duration of the  COVID-19 declaration under Section 564(b)(1) of the Act, 21 U.S.C. section 360bbb-3(b)(1), unless the authorization is terminated or revoked sooner.     Influenza A by PCR NEGATIVE NEGATIVE Final   Influenza B by PCR NEGATIVE NEGATIVE Final    Comment: (NOTE) The Xpert Xpress SARS-CoV-2/FLU/RSV assay is intended as an aid in  the diagnosis of influenza from Nasopharyngeal swab specimens and  should not be used as a sole basis for treatment. Nasal washings and  aspirates are unacceptable for Xpert Xpress SARS-CoV-2/FLU/RSV  testing.  Fact Sheet for Patients: PinkCheek.be  Fact Sheet for Healthcare Providers: GravelBags.it  This test is not yet approved or cleared by the [place] FDA and  has been authorized for detection and/or diagnosis of SARS-CoV-2  by  FDA under an Emergency Use Authorization (EUA). This EUA will remain  in effect (meaning this test can be used) for the duration of the  Covid-19 declaration under Section 564(b)(1) of the Act, 21  U.S.C. section 360bbb-3(b)(1), unless the authorization is  terminated or revoked. Performed at [facility], [address], [place], [place] [address]          Radiology Studies: MR FOOT LEFT W WO CONTRAST  Result Date: 02/23/2020 CLINICAL DATA:  Diabetic patient with a necrotic appearing left third toe. EXAM: MRI OF THE LEFT FOREFOOT WITHOUT AND WITH CONTRAST TECHNIQUE: Multiplanar, multisequence MR imaging of the left forefoot was performed both before and after administration of intravenous contrast. CONTRAST:  10 mL GADAVIST IV SOLN COMPARISON:  Plain films left foot 02/23/2020. FINDINGS: Bones/Joint/Cartilage No marrow edema or enhancement to suggest osteomyelitis is identified. Mild first MTP degenerative change is seen. Small, benign cyst in the bases of the third and fourth metatarsals are noted. No joint effusion. Ligaments Intact. Muscles and Tendons Intact. No intramuscular fluid collection. There is fatty atrophy of intrinsic musculature the foot. Soft tissues Skin ulceration is seen on the plantar surface of the foot deep to the third MTP joint. There is extensive subcutaneous edema diffusely about the foot and third toe. A large volume of gas is seen in the soft tissues about the third toe and in the third intermetatarsal space. IMPRESSION: Skin wound deep to the third MTP joint with underlying cellulitis and extensive soft tissue gas. Negative for abscess, osteomyelitis or septic joint. Electronically Signed   By: Inge Rise M.D.   On: 02/23/2020 14:43   DG Foot Complete Left  Result Date: 02/23/2020 CLINICAL DATA:  Left foot infection with drainage EXAM: LEFT FOOT - COMPLETE 3+ VIEW COMPARISON:  None. FINDINGS: No acute fracture or dislocation. Are no focal  bony erosion is identified. There is a probable plantar ulceration in the region of the third MTP joint. There is soft tissue swelling of the third digit with extensive gas within the soft tissues extending from the level of the distal phalanx to the third metatarsal head. Diffuse soft tissue swelling of the forefoot. IMPRESSION: 1. No specific radiographic evidence of osteomyelitis. 2. Soft tissue swelling of the left third toe with extensive gas within the soft tissues, concerning for infection with a gas-forming organism. Electronically Signed   By: Davina Poke D.O.   On: 02/23/2020 10:09        Scheduled Meds: . acidophilus  1 capsule Oral Daily  . apixaban  5 mg Oral BID  . aspirin EC  81 mg Oral Daily  . atorvastatin  40 mg Oral QHS  . clopidogrel  75 mg Oral Q breakfast  .  insulin aspart  0-15 Units Subcutaneous TID WC  . insulin glargine  12 Units Subcutaneous BID  . metoprolol tartrate  50 mg Oral BID  . pramipexole  1 mg Oral QHS  . primidone  250 mg Oral BID  . tamsulosin  0.4 mg Oral Daily   Continuous Infusions: . piperacillin-tazobactam (ZOSYN)  IV 3.375 g (02/24/20 0834)  . vancomycin 1,000 mg (02/23/20 2112)    Assessment & Plan:   Principal Problem:   Cellulitis of left lower extremity Active Problems:   Diabetes mellitus type 2 in obese (HCC)   PAD (peripheral artery disease) (HCC)   CAD (coronary artery disease)   Severe sepsis (HCC)   AKI (acute kidney injury) ([place])   #)  sepsis due to cellulitis: Diagnosis on the basis of left lower extremity erythema, swelling, and increased warmth, with subsequent plain films and MRI showing evidence of subcutaneous swelling consistent with cellulitis in the absence of any evidence of acute osteomyelitis.   Cultures pending Cellulitis with minimal improvement Leukocytosis improving, lactic acid normalized We will continue on IV antibiotics We will consult ID Plan for debridement and I&D by podiatry of the  necrosis of the third toe. At minimum may need amputation of the third toe with likely removal of the metatarsal head and debridement of the ulcerative site. Surgery consulted-plan for left lower extremity angiogram with possible intervention in a.m.      #) Acute kidney injury: Presenting creatinine noted to be 1.34 relative to most recent prior value of 0.823 when checked on 02/03/2020.   The renal secondary to intravascular depletion/severe sepsis with exacerbation of outpatient losartan  ARB on hold Creatinine is 1.32 Will continue with ivf. Monitor levels closely as pt is on vanco and zosyn until further change by ID recommendation .       #) Obstructive sleep apnea: On CPAP at home and reports compliance     #) Type 2 diabetes mellitus with hyperglycemia Blood glucose levels are elevated Hemoglobin A1c pending We will increase Lantus to 20 units twice daily R-ISS If need to will add NovoLog with meals Hold home Metformin for now   #) Peripheral artery disease/CAD: patient underwent coronary angiography on 02/03/2020 -s/p DES to mid RCA Per cardiology needs to be on dual antiplatelet therapy without interruption for 1 year  on Eliquis and Plavix  Continue with beta-blockers and statin therapy     #) BPH: Continue Flomax      DVT prophylaxis: Eliquis Code Status: Full Family Communication: Wife at bedside  Status is: Inpatient  Remains inpatient appropriate because:IV treatments appropriate due to intensity of illness or inability to take PO   Dispo: The patient is from: Home              Anticipated d/c is to: Home              Anticipated d/c date is: 3 days              Patient currently is not medically stable to d/c.diagnostic w/u pending . Needs ivabx             LOS: 1 day   Time spent: 45 min with >50% on coc   Nolberto Hanlon, MD [facility] Pager [phone]  If 7PM-7AM, please contact  night-coverage [email or web address] Password [email or web address] 02/24/2020, 8:51 AM

## 2020-02-24 NOTE — Anesthesia Preprocedure Evaluation (Signed)
Anesthesia Evaluation  Patient identified by MRN, date of birth, ID band Patient awake    Reviewed: Allergy & Precautions, H&P , NPO status , Patient's Chart, lab work & pertinent test results, reviewed documented beta blocker date and time   History of Anesthesia Complications (+) AWARENESS UNDER ANESTHESIA and history of anesthetic complications  Airway Mallampati: II  TM Distance: >3 FB Neck ROM: full    Dental  (+) Edentulous Upper, Upper Dentures, Missing, Dental Advidsory Given, Poor Dentition, Caps   Pulmonary neg shortness of breath, asthma , sleep apnea and Continuous Positive Airway Pressure Ventilation , COPD, neg recent URI, Current Smoker, former smoker,    Pulmonary exam normal breath sounds clear to auscultation       Cardiovascular Exercise Tolerance: Good hypertension, (-) angina+ CAD, + Past MI, + Cardiac Stents, + CABG and + Peripheral Vascular Disease  (-) dysrhythmias (-) Valvular Problems/Murmurs Rhythm:regular Rate:Normal     Neuro/Psych PSYCHIATRIC DISORDERS Depression negative neurological ROS     GI/Hepatic negative GI ROS, Neg liver ROS,   Endo/Other  diabetesMorbid obesity  Renal/GU negative Renal ROS     Musculoskeletal   Abdominal   Peds  Hematology negative hematology ROS (+)   Anesthesia Other Findings Past Medical History: No date: Asthma No date: Coronary artery disease No date: Depression No date: Diabetes mellitus without complication (HCC) No date: Gout No date: History anabolic steroid use No date: Hyperlipidemia No date: Hypertension No date: Hypogonadism in male No date: Morbid obesity (HCC) No date: Myocardial infarction ([place]) No date: Peripheral vascular disease ([place]) No date: Perirectal abscess No date: Pleurisy No date: Sleep apnea     Comment:  CPAP at night, no oxygen No date: Varicella   Reproductive/Obstetrics negative OB ROS                              Anesthesia Physical  Anesthesia Plan  ASA: III and emergent  Anesthesia Plan: General   Post-op Pain Management:    Induction: Intravenous  PONV Risk Score and Plan: 2 and Propofol infusion and TIVA  Airway Management Planned: Natural Airway and Simple Face Mask  Additional Equipment:   Intra-op Plan:   Post-operative Plan:   Informed Consent: I have reviewed the patients History and Physical, chart, labs and discussed the procedure including the risks, benefits and alternatives for the proposed anesthesia with the patient or authorized representative who has indicated his/her understanding and acceptance.       Plan Discussed with: CRNA  Anesthesia Plan Comments:         Anesthesia Quick Evaluation

## 2020-02-24 NOTE — Progress Notes (Signed)
[facility] Daily Progress Note   Consult received. Patient is known to our service. Placed on Dr. Lucky Cowboy scheduled tomorrow for left lower extremity angiogram with possible intervention. Full consult to follow.  Marcelle Overlie PA-C 02/24/2020 12:41 PM

## 2020-02-24 NOTE — Transfer of Care (Signed)
Immediate Anesthesia Transfer of Care Note  Patient: [name].  Procedure(s) Performed: IRRIGATION AND DEBRIDEMENT FOOT (Left Foot) AMPUTATION TOE (Left Toe)  Patient Location: PACU  Anesthesia Type:General  Level of Consciousness: awake, alert  and oriented  Airway & Oxygen Therapy: Patient Spontanous Breathing and Patient connected to face mask oxygen  Post-op Assessment: Report given to RN and Post -op Vital signs reviewed and stable  Post vital signs: Reviewed and stable  Last Vitals:  Vitals Value Taken Time  BP 121/63 02/24/20 1829  Temp 36.3 C 02/24/20 1829  Pulse 87 02/24/20 1832  Resp 18 02/24/20 1829  SpO2 99 % 02/24/20 1832    Last Pain:  Vitals:   02/24/20 1609  TempSrc:   PainSc: 9          Complications: No complications documented.

## 2020-02-24 NOTE — H&P (Addendum)
HISTORY AND PHYSICAL INTERVAL NOTE:  02/24/2020  4:49 PM  [name].  has presented today for surgery, with the diagnosis of left foot ulcer, gas gangrene.  The various methods of treatment have been discussed with the patient.  No guarantees were given.  After consideration of risks, benefits and other options for treatment, the patient has consented to surgery.  I have reviewed the patients chart and labs.    PROCEDURE: LEFT PARTIAL THIRD RAY AMPUTATION, INCISION AND DRAINAGE LEFT FOOT WITH REMOVAL OF ALL NONVIABLE AND NECROTIC TISSUE  Case is an emergency situation in which he needs the procedure due to infection and concern for further sepsis if left untreated.  Discussed with anesthesia who is agreeable to proceed.  A history and physical examination was performed in hospital.  The patient was reexamined.  There have been no changes to this history and physical examination.  Caroline More, DPM

## 2020-02-25 ENCOUNTER — Encounter
Admission: EM | Disposition: A | Discharge status: Home Health | Payer: Self-pay | Source: Home / Self Care | Attending: Internal Medicine

## 2020-02-25 ENCOUNTER — Encounter: Payer: Self-pay | Admitting: Podiatry

## 2020-02-25 ENCOUNTER — Inpatient Hospital Stay: Payer: PPO

## 2020-02-25 DIAGNOSIS — I70249 Atherosclerosis of native arteries of left leg with ulceration of unspecified site: Secondary | ICD-10-CM

## 2020-02-25 DIAGNOSIS — I2510 Atherosclerotic heart disease of native coronary artery without angina pectoris: Secondary | ICD-10-CM | POA: Diagnosis not present

## 2020-02-25 DIAGNOSIS — L089 Local infection of the skin and subcutaneous tissue, unspecified: Secondary | ICD-10-CM

## 2020-02-25 DIAGNOSIS — E11628 Type 2 diabetes mellitus with other skin complications: Secondary | ICD-10-CM

## 2020-02-25 DIAGNOSIS — I739 Peripheral vascular disease, unspecified: Secondary | ICD-10-CM | POA: Diagnosis not present

## 2020-02-25 HISTORY — PX: LOWER EXTREMITY ANGIOGRAPHY: CATH118251

## 2020-02-25 LAB — GLUCOSE, CAPILLARY
Glucose-Capillary: 411 mg/dL — ABNORMAL HIGH (ref 70–99)
Glucose-Capillary: 379 mg/dL — ABNORMAL HIGH (ref 70–99)
Glucose-Capillary: 386 mg/dL — ABNORMAL HIGH (ref 70–99)
Glucose-Capillary: 314 mg/dL — ABNORMAL HIGH (ref 70–99)
Glucose-Capillary: 310 mg/dL — ABNORMAL HIGH (ref 70–99)
Glucose-Capillary: 347 mg/dL — ABNORMAL HIGH (ref 70–99)
Glucose-Capillary: 332 mg/dL — ABNORMAL HIGH (ref 70–99)

## 2020-02-25 LAB — CBC
HCT: 35.1 % — ABNORMAL LOW (ref 39.0–52.0)
Hemoglobin: 11.5 g/dL — ABNORMAL LOW (ref 13.0–17.0)
MCH: 30.4 pg (ref 26.0–34.0)
MCHC: 32.8 g/dL (ref 30.0–36.0)
MCV: 92.9 fL (ref 80.0–100.0)
Platelets: 146 10*3/uL — ABNORMAL LOW (ref 150–400)
RBC: 3.78 MIL/uL — ABNORMAL LOW (ref 4.22–5.81)
RDW: 13.2 % (ref 11.5–15.5)
WBC: 11.1 10*3/uL — ABNORMAL HIGH (ref 4.0–10.5)
nRBC: 0 % (ref 0.0–0.2)

## 2020-02-25 LAB — BASIC METABOLIC PANEL
Anion gap: 7 (ref 5–15)
BUN: 26 mg/dL — ABNORMAL HIGH (ref 8–23)
CO2: 26 mmol/L (ref 22–32)
Calcium: 7.7 mg/dL — ABNORMAL LOW (ref 8.9–10.3)
Chloride: 100 mmol/L (ref 98–111)
Creatinine, Ser: 1.06 mg/dL (ref 0.61–1.24)
GFR, Estimated: >60 mL/min (ref >60)
Glucose, Bld: 407 mg/dL — ABNORMAL HIGH (ref 70–99)
Potassium: 4.3 mmol/L (ref 3.5–5.1)
Sodium: 133 mmol/L — ABNORMAL LOW (ref 135–145)

## 2020-02-25 SURGERY — LOWER EXTREMITY ANGIOGRAPHY
Anesthesia: Moderate Sedation | Laterality: Left

## 2020-02-25 MED ORDER — IODIXANOL 320 MG/ML IV SOLN
INTRAVENOUS | Status: DC | PRN
  Administered 2020-02-25: 50 mL

## 2020-02-25 MED ORDER — HEPARIN SODIUM (PORCINE) 1000 UNIT/ML IJ SOLN
INTRAMUSCULAR | Status: DC | PRN
  Administered 2020-02-25: 5000 [IU] via INTRAVENOUS

## 2020-02-25 MED ORDER — HYDROMORPHONE HCL 1 MG/ML IJ SOLN
1.0000 mg | Freq: Once | INTRAMUSCULAR | Status: AC | PRN
  Administered 2020-02-26: 1 mg via INTRAVENOUS
  Filled 2020-02-25: qty 1

## 2020-02-25 MED ORDER — NITROGLYCERIN 1 MG/10 ML FOR IR/CATH LAB
INTRA_ARTERIAL | Status: DC | PRN
  Administered 2020-02-25: 200 ug via INTRA_ARTERIAL

## 2020-02-25 MED ORDER — FENTANYL CITRATE (PF) 100 MCG/2ML IJ SOLN
INTRAMUSCULAR | Status: AC
  Filled 2020-02-25: qty 2

## 2020-02-25 MED ORDER — MIDAZOLAM HCL 2 MG/ML PO SYRP: 8.0000 mg | ORAL_SOLUTION | Freq: Once | ORAL | Status: DC | PRN

## 2020-02-25 MED ORDER — METHYLPREDNISOLONE SODIUM SUCC 125 MG IJ SOLR: 125.0000 mg | Freq: Once | INTRAMUSCULAR | Status: DC | PRN

## 2020-02-25 MED ORDER — FAMOTIDINE 20 MG PO TABS
ORAL_TABLET | ORAL | Status: AC
  Filled 2020-02-25: qty 2

## 2020-02-25 MED ORDER — INSULIN GLARGINE 100 UNIT/ML ~~LOC~~ SOLN
25.0000 [IU] | Freq: Two times a day (BID) | SUBCUTANEOUS | Status: DC
  Administered 2020-02-25 (×2): 25 [IU] via SUBCUTANEOUS
  Filled 2020-02-25 (×4): qty 0.25

## 2020-02-25 MED ORDER — FAMOTIDINE 20 MG PO TABS: 40.0000 mg | ORAL_TABLET | Freq: Once | ORAL | Status: DC | PRN

## 2020-02-25 MED ORDER — FENTANYL CITRATE (PF) 100 MCG/2ML IJ SOLN
INTRAMUSCULAR | Status: DC | PRN
  Administered 2020-02-25 (×2): 50 ug via INTRAVENOUS

## 2020-02-25 MED ORDER — HEPARIN SODIUM (PORCINE) 1000 UNIT/ML IJ SOLN
INTRAMUSCULAR | Status: AC
  Filled 2020-02-25: qty 1

## 2020-02-25 MED ORDER — MIDAZOLAM HCL 2 MG/2ML IJ SOLN
INTRAMUSCULAR | Status: DC | PRN
  Administered 2020-02-25 (×2): 2 mg via INTRAVENOUS

## 2020-02-25 MED ORDER — MIDAZOLAM HCL 5 MG/5ML IJ SOLN
INTRAMUSCULAR | Status: AC
  Filled 2020-02-25: qty 5

## 2020-02-25 MED ORDER — INSULIN ASPART 100 UNIT/ML ~~LOC~~ SOLN
5.0000 [IU] | Freq: Three times a day (TID) | SUBCUTANEOUS | Status: DC
  Administered 2020-02-25 – 2020-02-26 (×3): 5 [IU] via SUBCUTANEOUS
  Filled 2020-02-25 (×2): qty 1

## 2020-02-25 MED ORDER — NITROGLYCERIN IN D5W 200-5 MCG/ML-% IV SOLN
INTRAVENOUS | Status: AC
  Filled 2020-02-25: qty 250

## 2020-02-25 MED ORDER — DIPHENHYDRAMINE HCL 50 MG/ML IJ SOLN
INTRAMUSCULAR | Status: AC
  Filled 2020-02-25: qty 1

## 2020-02-25 MED ORDER — LINEZOLID 600 MG PO TABS
600.0000 mg | ORAL_TABLET | Freq: Two times a day (BID) | ORAL | Status: DC
  Administered 2020-02-25 – 2020-03-03 (×12): 600 mg via ORAL
  Filled 2020-02-25 (×17): qty 1

## 2020-02-25 MED ORDER — DIPHENHYDRAMINE HCL 50 MG/ML IJ SOLN: 50.0000 mg | Freq: Once | INTRAMUSCULAR | Status: DC | PRN

## 2020-02-25 MED ORDER — METHYLPREDNISOLONE SODIUM SUCC 125 MG IJ SOLR
INTRAMUSCULAR | Status: AC
  Filled 2020-02-25: qty 2

## 2020-02-25 MED ORDER — ONDANSETRON HCL 4 MG/2ML IJ SOLN: 4.0000 mg | Freq: Four times a day (QID) | INTRAMUSCULAR | Status: DC | PRN

## 2020-02-25 SURGICAL SUPPLY — 18 items
BALLN LUTONIX 018 4X220X130 (BALLOONS) ×3
BALLN LUTONIX 018 5X100X130 (BALLOONS) ×3
BALLN ULTRVRSE 3.5X300X150 (BALLOONS) ×3
BALLOON LUTONIX 018 4X220X130 (BALLOONS) ×1 IMPLANT
BALLOON LUTONIX 018 5X100X130 (BALLOONS) ×1 IMPLANT
BALLOON ULTRVRSE 3.5X300X150 (BALLOONS) ×1 IMPLANT
CATH ANGIO 5F PIGTAIL 65CM (CATHETERS) ×3 IMPLANT
CATH VERT 5FR 125CM (CATHETERS) ×3 IMPLANT
DEVICE STARCLOSE SE CLOSURE (Vascular Products) ×3 IMPLANT
GLIDEWIRE ADV .035X260CM (WIRE) ×3 IMPLANT
KIT ENCORE 26 ADVANTAGE (KITS) ×3 IMPLANT
PACK ANGIOGRAPHY (CUSTOM PROCEDURE TRAY) ×3 IMPLANT
SHEATH ANL2 6FRX45 HC (SHEATH) ×3 IMPLANT
SHEATH BRITE TIP 5FRX11 (SHEATH) ×3 IMPLANT
SYR MEDRAD MARK 7 150ML (SYRINGE) ×3 IMPLANT
TUBING CONTRAST HIGH PRESS 72 (TUBING) ×6 IMPLANT
WIRE G V18X300CM (WIRE) ×3 IMPLANT
WIRE J 3MM .035X145CM (WIRE) ×3 IMPLANT

## 2020-02-25 NOTE — Progress Notes (Signed)
PROGRESS NOTE    [name].  PIR:[id number] DOB: 09/29/1955 DOA: 02/23/2020 PCP: [name], MD    Brief Narrative:   [name]. is a 64 y.o. male with medical history significant for peripheral artery disease on chronic Eliquis therapy, coronary artery disease status post PCI with drug-eluting stent to the mid RCA on 02/03/2020, type 2 diabetes mellitus, hypertension, obstructive sleep apnea compliant on home nocturnal CPAP, who is admitted to [facility] on 02/23/2020 with severe sepsis due to left lower extremity cellulitis after presenting from home to [facility] complaining of left lower extremity erythema.  The patient confirms that he underwent coronary angiography on 02/03/2020 which revealed 90% stenosis of the mid RCA which time he underwent PCI with drug-eluting stent placement to the mid RCA.  Per my review of the ensuing cardiology note, the patient is to remain on on dual antiplatelet therapy without interruption for the next 1 year.  Denies any ensuing chest pain, shortness of breath, palpitations, diaphoresis, dizziness, presyncope, or syncope  10/28- this am bg 411.   Consultants:    podiatry, vascular surgery  Procedures:   Antimicrobials:   zosyn  Vancomycin  Subjective: Has no complaints this am. Wife at bedside.    Objective: Vitals:   02/25/20 0037 02/25/20 0358 02/25/20 0401 02/25/20 0750  BP: (!) 144/76 135/66  129/67  Pulse: 93 95  92  Resp:  19  20  Temp: 98.9 F (37.2 C) 98.2 F (36.8 C)  99.5 F (37.5 C)  TempSrc:  Oral  Oral  SpO2: 95% 94%  94%  Weight:   (!) 139.9 kg   Height:        Intake/Output Summary (Last 24 hours) at 02/25/2020 0754 Last data filed at 02/25/2020 0700 Gross per 24 hour  Intake 1618.61 ml  Output 10 ml  Net 1608.61 ml   Filed Weights   02/23/20 0931 02/24/20 0521 02/25/20 0401  Weight: 133 kg (!) 139.7 kg (!) 139.9 kg     Examination: Calm, comfortable, NAD CTA, no wheeze rales rhonchi's RRR S1-S2 no murmurs Soft benign positive bowel sounds Bilateral lower extremity edema, left lower extremity wrapped in dressing Mood and affect appropriate in current setting Alert oriented x3 grossly intact     Data Reviewed: I have personally reviewed following labs and imaging studies  CBC: Recent Labs  Lab 02/23/20 1058 02/24/20 0429 02/25/20 0455  WBC 16.3* 14.3* 11.1*  NEUTROABS 14.1* 12.0*  --   HGB 12.8* 11.4* 11.5*  HCT 38.9* 34.0* 35.1*  MCV 92.6 91.9 92.9  PLT 161 155 630*   Basic Metabolic Panel: Recent Labs  Lab 02/23/20 1058 02/24/20 0429 02/25/20 0455  NA 131* 130* 133*  K 4.6 4.1 4.3  CL 96* 98 100  CO2 25 24 26   GLUCOSE 406* 387* 407*  BUN 31* 33* 26*  CREATININE 1.34* 1.32* 1.06  CALCIUM 8.4* 7.8* 7.7*  MG 1.8 1.8  --    GFR: Estimated Creatinine Clearance: 106.1 mL/min (by C-G formula based on SCr of 1.06 mg/dL). Liver Function Tests: Recent Labs  Lab 02/23/20 1058 02/24/20 0429  AST 17 15  ALT 19 16  ALKPHOS 83 75  BILITOT 0.9 0.7  PROT 6.7 5.8*  ALBUMIN 3.1* 2.6*   No results for input(s): LIPASE, AMYLASE in the last 168 hours. No results for input(s): AMMONIA in the last 168 hours. Coagulation Profile: Recent Labs  Lab 02/24/20 0429  INR 1.3*  Cardiac Enzymes: No results for input(s): CKTOTAL, CKMB, CKMBINDEX, TROPONINI in the last 168 hours. BNP (last 3 results) No results for input(s): PROBNP in the last 8760 hours. HbA1C: Recent Labs    02/24/20 0429  HGBA1C 9.7*   CBG: Recent Labs  Lab 02/24/20 0745 02/24/20 1129 02/24/20 1829 02/24/20 2127 02/25/20 0747  GLUCAP 349* 337* 288* 296* 411*   Lipid Profile: No results for input(s): CHOL, HDL, LDLCALC, TRIG, CHOLHDL, LDLDIRECT in the last 72 hours. Thyroid Function Tests: No results for input(s): TSH, T4TOTAL, FREET4, T3FREE, THYROIDAB in the last 72 hours. Anemia Panel: No results  for input(s): VITAMINB12, FOLATE, FERRITIN, TIBC, IRON, RETICCTPCT in the last 72 hours. Sepsis Labs: Recent Labs  Lab 02/23/20 1058 02/23/20 1454  LATICACIDVEN 2.1* 1.5    Recent Results (from the past 240 hour(s))  Culture, blood (routine x 2)     Status: None (Preliminary result)   Collection Time: 02/23/20 10:58 AM   Specimen: BLOOD  Result Value Ref Range Status   Specimen Description BLOOD BLOOD RIGHT FOREARM  Final   Special Requests   Final    BOTTLES DRAWN AEROBIC AND ANAEROBIC Blood Culture adequate volume   Culture   Final    NO GROWTH 2 DAYS Performed at [facility], [address], [place], [place] [address]    Report Status PENDING  Incomplete  Culture, blood (routine x 2)     Status: None (Preliminary result)   Collection Time: 02/23/20 10:58 AM   Specimen: BLOOD  Result Value Ref Range Status   Specimen Description BLOOD Blood Culture adequate volume  Final   Special Requests   Final    BOTTLES DRAWN AEROBIC AND ANAEROBIC LEFT ANTECUBITAL   Culture   Final    NO GROWTH 2 DAYS Performed at [facility], [address], [place], [place] [address]    Report Status PENDING  Incomplete  Respiratory Panel by RT PCR (Flu A&B, Covid) - Nasopharyngeal Swab     Status: None   Collection Time: 02/23/20 11:59 AM   Specimen: Nasopharyngeal Swab  Result Value Ref Range Status   SARS Coronavirus 2 by RT PCR NEGATIVE NEGATIVE Final    Comment: (NOTE) SARS-CoV-2 target nucleic acids are NOT DETECTED.  The SARS-CoV-2 RNA is generally detectable in upper respiratoy specimens during the acute phase of infection. The lowest concentration of SARS-CoV-2 viral copies this assay can detect is 131 copies/mL. A negative result does not preclude SARS-Cov-2 infection and should not be used as the sole basis for treatment or other patient management decisions. A negative result may occur with  improper specimen collection/handling, submission of specimen  other than nasopharyngeal swab, presence of viral mutation(s) within the areas targeted by this assay, and inadequate number of viral copies (<131 copies/mL). A negative result must be combined with clinical observations, patient history, and epidemiological information. The expected result is Negative.  Fact Sheet for Patients:  PinkCheek.be  Fact Sheet for Healthcare Providers:  GravelBags.it  This test is no t yet approved or cleared by the [place] FDA and  has been authorized for detection and/or diagnosis of SARS-CoV-2 by FDA under an Emergency Use Authorization (EUA). This EUA will remain  in effect (meaning this test can be used) for the duration of the COVID-19 declaration under Section 564(b)(1) of the Act, 21 U.S.C. section 360bbb-3(b)(1), unless the authorization is terminated or revoked sooner.     Influenza A by PCR NEGATIVE NEGATIVE Final   Influenza B by  PCR NEGATIVE NEGATIVE Final    Comment: (NOTE) The Xpert Xpress SARS-CoV-2/FLU/RSV assay is intended as an aid in  the diagnosis of influenza from Nasopharyngeal swab specimens and  should not be used as a sole basis for treatment. Nasal washings and  aspirates are unacceptable for Xpert Xpress SARS-CoV-2/FLU/RSV  testing.  Fact Sheet for Patients: PinkCheek.be  Fact Sheet for Healthcare Providers: GravelBags.it  This test is not yet approved or cleared by the [place] FDA and  has been authorized for detection and/or diagnosis of SARS-CoV-2 by  FDA under an Emergency Use Authorization (EUA). This EUA will remain  in effect (meaning this test can be used) for the duration of the  Covid-19 declaration under Section 564(b)(1) of the Act, 21  U.S.C. section 360bbb-3(b)(1), unless the authorization is  terminated or revoked. Performed at [facility] [address],  [place], [place] [address]   Surgical pcr screen     Status: None   Collection Time: 02/24/20 12:25 PM   Specimen: Nasal Mucosa; Nasal Swab  Result Value Ref Range Status   MRSA, PCR NEGATIVE NEGATIVE Final   Staphylococcus aureus NEGATIVE NEGATIVE Final    Comment: (NOTE) The Xpert SA Assay (FDA approved for NASAL specimens in patients 49 years of age and older), is one component of a comprehensive surveillance program. It is not intended to diagnose infection nor to guide or monitor treatment. Performed at [facility], [address] [place], [place] [address]   Aerobic/Anaerobic Culture (surgical/deep wound)     Status: None (Preliminary result)   Collection Time: 02/24/20  5:44 PM   Specimen: PATH Other; Body Fluid  Result Value Ref Range Status   Specimen Description   Final    WOUND 3RD RAY LEFT FOOT SAMPLE A Performed at [facility], [address], [place], [place] [address]    Special Requests   Final    NONE Performed at [facility], [place], [place] [address]    Gram Stain   Final    NO WBC SEEN ABUNDANT GRAM POSITIVE COCCI FEW GRAM NEGATIVE DIPLOCOCCI Performed at [facility], [place] [address] [place], [place] [address]    Culture PENDING  Incomplete   Report Status PENDING  Incomplete         Radiology Studies: MR FOOT LEFT W WO CONTRAST  Result Date: 02/23/2020 CLINICAL DATA:  Diabetic patient with a necrotic appearing left third toe. EXAM: MRI OF THE LEFT FOREFOOT WITHOUT AND WITH CONTRAST TECHNIQUE: Multiplanar, multisequence MR imaging of the left forefoot was performed both before and after administration of intravenous contrast. CONTRAST:  10 mL GADAVIST IV SOLN COMPARISON:  Plain films left foot 02/23/2020. FINDINGS: Bones/Joint/Cartilage No marrow edema or enhancement to suggest osteomyelitis is identified. Mild first MTP degenerative change is seen. Small, benign cyst in the bases of the third and  fourth metatarsals are noted. No joint effusion. Ligaments Intact. Muscles and Tendons Intact. No intramuscular fluid collection. There is fatty atrophy of intrinsic musculature the foot. Soft tissues Skin ulceration is seen on the plantar surface of the foot deep to the third MTP joint. There is extensive subcutaneous edema diffusely about the foot and third toe. A large volume of gas is seen in the soft tissues about the third toe and in the third intermetatarsal space. IMPRESSION: Skin wound deep to the third MTP joint with underlying cellulitis and extensive soft tissue gas. Negative for abscess, osteomyelitis or septic joint. Electronically  Signed   By: Inge Rise M.D.   On: 02/23/2020 14:43   DG Foot Complete Left  Result Date: 02/23/2020 CLINICAL DATA:  Left foot infection with drainage EXAM: LEFT FOOT - COMPLETE 3+ VIEW COMPARISON:  None. FINDINGS: No acute fracture or dislocation. Are no focal bony erosion is identified. There is a probable plantar ulceration in the region of the third MTP joint. There is soft tissue swelling of the third digit with extensive gas within the soft tissues extending from the level of the distal phalanx to the third metatarsal head. Diffuse soft tissue swelling of the forefoot. IMPRESSION: 1. No specific radiographic evidence of osteomyelitis. 2. Soft tissue swelling of the left third toe with extensive gas within the soft tissues, concerning for infection with a gas-forming organism. Electronically Signed   By: Davina Poke D.O.   On: 02/23/2020 10:09        Scheduled Meds: . acidophilus  1 capsule Oral Daily  . apixaban  5 mg Oral BID  . aspirin EC  81 mg Oral Daily  . atorvastatin  40 mg Oral QHS  . clopidogrel  75 mg Oral Q breakfast  . insulin aspart  0-15 Units Subcutaneous TID WC  . insulin glargine  20 Units Subcutaneous BID  . metoprolol tartrate  50 mg Oral BID  . pramipexole  1 mg Oral QHS  . primidone  250 mg Oral BID  . tamsulosin   0.4 mg Oral Daily   Continuous Infusions: . sodium chloride 75 mL/hr at 02/25/20 0700  . piperacillin-tazobactam (ZOSYN)  IV Stopped (02/25/20 1610)  . vancomycin Stopped (02/24/20 2251)    Assessment & Plan:   Principal Problem:   Cellulitis of left lower extremity Active Problems:   Diabetes mellitus type 2 in obese (HCC)   PAD (peripheral artery disease) ([place])   CAD (coronary artery disease)   Severe sepsis (HCC)   AKI (acute kidney injury) ([place])   #)  sepsis due to cellulitis/left third MTPJ ulceration with gas gangrene: Diagnosis on the basis of left lower extremity erythema, swelling, and increased warmth, with subsequent plain films and MRI showing evidence of subcutaneous swelling consistent with cellulitis in the absence of any evidence of acute osteomyelitis.   -Leukocytosis and lactic acid improved Cultures pending Status post left partial third ray amputation by Dr. Luana Shu on 10/27 Plan for angiogram this a.m. by vascular ID consulted    #) Acute kidney injury: Presenting creatinine noted to be 1.34 relative to most recent prior value of 0.823 when checked on 02/03/2020.   The renal secondary to intravascular depletion/severe sepsis with exacerbation of outpatient losartan  ARB on hold 10/28-renal function improved with IV fluids.  Creatinine 1.06 today. We will continue on hydration since he is going for angiogram. Monitor closely as also patient on vancomycin and Zosyn.      #) Obstructive sleep apnea: On CPAP at home     #) Type 2 diabetes mellitus with hyperglycemia-controlled Hemoglobin A1c 9.7 Blood glucose levels are elevated here We will increase Lantus to 25 units twice daily Add NovoLog 5U 3 times daily with meals Continue R-ISS Hold Metformin for now      #) Peripheral artery disease/CAD: Plan for angiogram today by vascular  Patient underwent coronary angiography on 02/03/2020 status post DES to mid RCA  We will continue on  Eliquis and Plavix  Per cardiology needs to be on dual antiplatelet therapy without interruption for 1 year  Continue beta-blockers and statin therapy       #)  BPH: Continue Flomax      DVT prophylaxis: Eliquis Code Status: Full Family Communication: Wife at bedside  Status is: Inpatient  Remains inpatient appropriate because:IV treatments appropriate due to intensity of illness or inability to take PO   Dispo: The patient is from: Home              Anticipated d/c is to: Home              Anticipated d/c date is: 3 days              Patient currently is not medically stable to d/c.diagnostic w/u pending . Needs ivabx             LOS: 2 days   Time spent: 45 min with >50% on coc   Nolberto Hanlon, MD [facility] Pager [phone]  If 7PM-7AM, please contact night-coverage [email or web address] Password [facility] 02/25/2020, 7:54 AM

## 2020-02-25 NOTE — Consult Note (Addendum)
NAME: [name]  DOB: 1955/07/04  MRN: [id number]  Date/Time: 02/25/2020 8:37 PM  REQUESTING PROVIDER: Dr. Reggy Eye Subjective:  REASON FOR CONSULT: Left foot infection ?History from patient and his partner and chart reviewed Ramie Erman. is a 64 y.o. with a history of DM, HTN, HLD  CAD, CABG, recent PCA 10/6 for occluded RCA, presents with left foot infection.  As per his partner patient has had a callus in the left foot for many months now.  She used to file it and one time the core of the callus came out and there was a small hole.  Patient was walking with crocs and no shoes and and his partner saw gravel and sand in that hole.  But she cleaned.  Few days ago the foot started to get red and swollen and painful.  Patient was also having some fever and chills.  He went to see Dr. Vickki Muff on 02/22/2020 who saw the patient and sent him to the emergency department.  In sept he was treated with cipro and doxy for 10 days for a wound on his left leg following a scratch. Vitals in the ED were as follows BP 02/23/20 0930 (!) 122/102  Pulse Rate 02/23/20 0930 96  Resp 02/23/20 0930 20  Temp 02/23/20 0930 99.9 F (37.7 C)  Temp Source 02/23/20 0930 Oral  SpO2 02/23/20 0930 94 %  Weight 02/23/20 0931 293 lb 3.4 oz (133 kg)   Labs revealed a WBC of 16.3, hemoglobin of 12.8, platelet 161.  Lactate was 2.1.  Creatinine was 1.34. he underwent an MRI of the left foot which showed skin wound deep to the third metatarsal phalangeal joint with underlying cellulitis and extensive soft tissue gas.  It was negative for abscess, osteomyelitis or septic joint.  He was started on Vanco and Zosyn.  He underwent surgery on 02/23/2020.  Dr. Luana Shu did a partial left third ray amputation.  Culture and pathology have been sent. He underwent angio today and had PTA of the left posterior tibial artery and left popliteal artery and proximal tibioperoneal trunk. I am seeing the patient for  the management of the infection. Patient had a similar infection on the right foot many years ago and had couple of toes removed He has diabetes but had lost significant weight and was off medications for some time.  But recently sugars started to increase.  He also has neck neuropathy and has reduced sensations on his feet. He has peripheral artery disease and has had stents on his right leg He also has coronary artery disease and recently underwent stenting to the RCA.  He has a history of CABG Past Medical History:  Diagnosis Date  . Asthma   . Coronary artery disease   . Depression   . Diabetes mellitus without complication ([place])   . Gout   . History anabolic steroid use   . Hyperlipidemia   . Hypertension   . Hypogonadism in male   . Morbid obesity ([place])   . Myocardial infarction ([place])   . Peripheral vascular disease ([place])   . Perirectal abscess   . Pleurisy   . Sleep apnea    CPAP at night, no oxygen  . Varicella     Past Surgical History:  Procedure Laterality Date  . ABDOMINAL AORTIC ANEURYSM REPAIR    . AMPUTATION TOE Right 02/10/2016   Procedure: AMPUTATION TOE 3RD TOE;  Surgeon: Samara Deist, DPM;  Location: [facility] ORS;  Service: Podiatry;  Laterality:  Right;  . AMPUTATION TOE Left 02/24/2020   Procedure: AMPUTATION TOE;  Surgeon: Caroline More, DPM;  Location: [facility] ORS;  Service: Podiatry;  Laterality: Left;  . COLONOSCOPY WITH PROPOFOL [name] 11/18/2015   Procedure: COLONOSCOPY WITH PROPOFOL;  Surgeon: Manya Silvas, MD;  Location: [facility] ENDOSCOPY;  Service: Endoscopy;  Laterality: [name];  . CORONARY ARTERY BYPASS GRAFT    . CORONARY STENT INTERVENTION [name] 02/02/2020   Procedure: CORONARY STENT INTERVENTION;  Surgeon: Isaias Cowman, MD;  Location: [place] [facility];  Service: Cardiovascular;  Laterality: [name];  . IRRIGATION AND DEBRIDEMENT FOOT Left 02/24/2020   Procedure: IRRIGATION AND DEBRIDEMENT FOOT;  Surgeon: Caroline More, DPM;  Location: [facility] ORS;   Service: Podiatry;  Laterality: Left;  . KNEE ARTHROSCOPY    . LEFT HEART CATH AND CORS/GRAFTS ANGIOGRAPHY [name] 02/02/2020   Procedure: LEFT HEART CATH AND CORS/GRAFTS ANGIOGRAPHY;  Surgeon: Teodoro Spray, MD;  Location: [place] [facility];  Service: Cardiovascular;  Laterality: [name];  . LOWER EXTREMITY ANGIOGRAPHY Left 02/25/2020   Procedure: Lower Extremity Angiography;  Surgeon: Algernon Huxley, MD;  Location: [place] [facility];  Service: Cardiovascular;  Laterality: Left;  . PERIPHERAL VASCULAR CATHETERIZATION Right 01/24/2016   Procedure: Lower Extremity Angiography;  Surgeon: Katha Cabal, MD;  Location: [place] [facility];  Service: Cardiovascular;  Laterality: Right;  . PERIPHERAL VASCULAR CATHETERIZATION Right 01/25/2016   Procedure: Lower Extremity Angiography;  Surgeon: Katha Cabal, MD;  Location: [place] [facility];  Service: Cardiovascular;  Laterality: Right;  . TOE AMPUTATION    . TONSILLECTOMY      Social History   Socioeconomic History  . Marital status: Widowed    Spouse name: Not on file  . Number of children: Not on file  . Years of education: Not on file  . Highest education level: Not on file  Occupational History  . Not on file  Tobacco Use  . Smoking status: Former Smoker    Packs/day: 0.50    Years: 45.00    Pack years: 22.50    Types: Cigarettes    Quit date: 04/05/2015    Years since quitting: 4.8  . Smokeless tobacco: Never Used  Vaping Use  . Vaping Use: Every day  Substance and Sexual Activity  . Alcohol use: Not Currently    Alcohol/week: 3.0 standard drinks    Types: 3 Glasses of wine per week  . Drug use: No  . Sexual activity: Not on file  Other Topics Concern  . Not on file  Social History Narrative  . Not on file   Social Determinants of Health   Financial Resource Strain:   . Difficulty of Paying Living Expenses: Not on file  Food Insecurity:   . Worried About Charity fundraiser in the Last Year: Not on file  . Ran  Out of Food in the Last Year: Not on file  Transportation Needs:   . Lack of Transportation (Medical): Not on file  . Lack of Transportation (Non-Medical): Not on file  Physical Activity:   . Days of Exercise per Week: Not on file  . Minutes of Exercise per Session: Not on file  Stress:   . Feeling of Stress : Not on file  Social Connections:   . Frequency of Communication with Friends and Family: Not on file  . Frequency of Social Gatherings with Friends and Family: Not on file  . Attends Religious Services: Not on file  . Active Member of Clubs or Organizations:  Not on file  . Attends Archivist Meetings: Not on file  . Marital Status: Not on file  Intimate Partner Violence:   . Fear of Current or Ex-Partner: Not on file  . Emotionally Abused: Not on file  . Physically Abused: Not on file  . Sexually Abused: Not on file    No Known Allergies  ?Family history Mom had dementia  Current Facility-Administered Medications  Medication Dose Route Frequency Provider Last Rate Last Admin  . 0.9 %  sodium chloride infusion   Intravenous Continuous Algernon Huxley, MD 75 mL/hr at 02/25/20 1749 New Bag at 02/25/20 1749  . acetaminophen (TYLENOL) tablet 650 mg  650 mg Oral Q6H PRN Algernon Huxley, MD       Or  . acetaminophen (TYLENOL) suppository 650 mg  650 mg Rectal Q6H PRN Algernon Huxley, MD      . acidophilus (RISAQUAD) capsule 1 capsule  1 capsule Oral Daily Dew, Erskine Squibb, MD      . apixaban (ELIQUIS) tablet 5 mg  5 mg Oral BID Algernon Huxley, MD   5 mg at 02/23/20 2103  . aspirin EC tablet 81 mg  81 mg Oral Daily Algernon Huxley, MD   81 mg at 02/25/20 1439  . atorvastatin (LIPITOR) tablet 40 mg  40 mg Oral QHS Algernon Huxley, MD   40 mg at 02/24/20 2145  . clopidogrel (PLAVIX) tablet 75 mg  75 mg Oral Q breakfast Algernon Huxley, MD   75 mg at 02/25/20 4818  . fentaNYL (SUBLIMAZE) 100 MCG/2ML injection           . heparin sodium (porcine) 1000 UNIT/ML injection           .  HYDROcodone-acetaminophen (NORCO/VICODIN) 5-325 MG per tablet 1 tablet  1 tablet Oral Q6H PRN Algernon Huxley, MD   1 tablet at 02/25/20 1925  . HYDROmorphone (DILAUDID) injection 1 mg  1 mg Intravenous Once PRN Algernon Huxley, MD      . insulin aspart (novoLOG) injection 0-15 Units  0-15 Units Subcutaneous TID WC Algernon Huxley, MD   11 Units at 02/25/20 1751  . insulin aspart (novoLOG) injection 5 Units  5 Units Subcutaneous TID WC Algernon Huxley, MD   5 Units at 02/25/20 1750  . insulin glargine (LANTUS) injection 25 Units  25 Units Subcutaneous BID Algernon Huxley, MD   25 Units at 02/25/20 1008  . linezolid (ZYVOX) tablet 600 mg  600 mg Oral Q12H Jaxtyn Linville, MD      . metoprolol tartrate (LOPRESSOR) tablet 50 mg  50 mg Oral BID Algernon Huxley, MD   50 mg at 02/24/20 2145  . midazolam (VERSED) 5 MG/5ML injection           . nitroGLYCERIN 0.2 mg/mL in dextrose 5 % infusion           . ondansetron (ZOFRAN) injection 4 mg  4 mg Intravenous Q6H PRN Algernon Huxley, MD      . ondansetron [facility]) injection 4 mg  4 mg Intravenous Q6H PRN Algernon Huxley, MD      . piperacillin-tazobactam (ZOSYN) IVPB 3.375 g  3.375 g Intravenous Q8H Algernon Huxley, MD 12.5 mL/hr at 02/25/20 1801 3.375 g at 02/25/20 1801  . pramipexole (MIRAPEX) tablet 1 mg  1 mg Oral QHS Algernon Huxley, MD   1 mg at 02/24/20 2145  . primidone (MYSOLINE) tablet 250 mg  250  mg Oral BID Algernon Huxley, MD   250 mg at 02/24/20 2145  . tamsulosin (FLOMAX) capsule 0.4 mg  0.4 mg Oral Daily Algernon Huxley, MD   0.4 mg at 02/25/20 1439   Facility-Administered Medications Ordered in Other Encounters  Medication Dose Route Frequency Provider Last Rate Last Admin  . sodium chloride flush (NS) 0.9 % injection 3 mL  3 mL Intravenous Q12H Teodoro Spray, MD         Abtx:  Anti-infectives (From admission, onward)   Start     Dose/Rate Route Frequency Ordered Stop   02/25/20 2200  linezolid (ZYVOX) tablet 600 mg        600 mg Oral Every 12 hours 02/25/20  1739     02/24/20 1748  vancomycin (VANCOCIN) powder  Status:  Discontinued          As needed 02/24/20 1748 02/24/20 1825   02/23/20 2000  vancomycin (VANCOCIN) IVPB 1000 mg/200 mL premix  Status:  Discontinued        1,000 mg 200 mL/hr over 60 Minutes Intravenous Every 12 hours 02/23/20 1602 02/25/20 1739   02/23/20 1800  piperacillin-tazobactam (ZOSYN) IVPB 3.375 g        3.375 g 12.5 mL/hr over 240 Minutes Intravenous Every 8 hours 02/23/20 1600     02/23/20 1030  vancomycin (VANCOCIN) IVPB 1000 mg/200 mL premix        1,000 mg 200 mL/hr over 60 Minutes Intravenous  Once 02/23/20 1025 02/23/20 1302   02/23/20 1030  piperacillin-tazobactam (ZOSYN) IVPB 3.375 g        3.375 g 100 mL/hr over 30 Minutes Intravenous  Once 02/23/20 1025 02/23/20 1159      REVIEW OF SYSTEMS:  Const: fever,  chills, intentional weight loss Eyes: negative diplopia or visual changes, negative eye pain ENT: negative coryza, negative sore throat Resp: negative cough, hemoptysis, dyspnea Cards: negative for chest pain, palpitations, lower extremity edema GU: negative for frequency, dysuria and hematuria GI: Negative for abdominal pain, diarrhea, bleeding, constipation Skin: negative for rash and pruritus Heme: negative for easy bruising and gum/nose bleeding MS: As above Neurolo: Decreased sensation feet Psych: negative for feelings of anxiety, depression  Endocrine: Poorly controlled diabetes Allergy/Immunology- negative for any medication or food allergies ?  Objective:  VITALS:  BP 128/73 (BP Location: Left Arm)   Pulse 96   Temp 98.9 F (37.2 C) (Oral)   Resp 18   Ht 6\' 4"  (1.93 m)   Wt (!) 139.9 kg   SpO2 99%   BMI 37.54 kg/m  PHYSICAL EXAM:  General: Alert, cooperative, no distress, appears stated age.  Head: Normocephalic, without obvious abnormality, atraumatic. Eyes: Conjunctivae clear, anicteric sclerae. Pupils are equal ENT Nares normal. No drainage or sinus tenderness. Lips,  mucosa, and tongue normal. No Thrush Neck: Supple, symmetrical, no adenopathy, thyroid: non tender no carotid bruit and no JVD. Back: No CVA tenderness. Lungs: Clear to auscultation bilaterally. No Wheezing or Rhonchi. No rales. Heart: Regular rate and rhythm, no murmur, rub or gallop. Abdomen: Soft, non-tender,not distended. Bowel sounds normal. No masses Extremities: Left foot.  Dorsum of the foot has some erythema.  There is third dose amputated and the surgical site has maceration and some nonviable tissue.    Pre surgery     Right foot absent third and fourth toe  Skin: No rashes or lesions. Or bruising Lymph: Cervical, supraclavicular normal. Neurologic: Decreased sensation feet Pertinent Labs Lab Results CBC    Component  Value Date/Time   WBC 11.1 (H) 02/25/2020 0455   RBC 3.78 (L) 02/25/2020 0455   HGB 11.5 (L) 02/25/2020 0455   HGB 14.7 07/12/2011 0919   HCT 35.1 (L) 02/25/2020 0455   HCT 45.4 07/12/2011 0919   PLT 146 (L) 02/25/2020 0455   PLT 156 07/12/2011 0919   MCV 92.9 02/25/2020 0455   MCV 96 07/12/2011 0919   MCH 30.4 02/25/2020 0455   MCHC 32.8 02/25/2020 0455   RDW 13.2 02/25/2020 0455   RDW 15.2 (H) 07/12/2011 0919   LYMPHSABS 0.9 02/24/2020 0429   LYMPHSABS 1.2 07/12/2011 0919   MONOABS 1.1 (H) 02/24/2020 0429   MONOABS 0.8 (H) 07/12/2011 0919   EOSABS 0.1 02/24/2020 0429   EOSABS 0.4 07/12/2011 0919   BASOSABS 0.1 02/24/2020 0429   BASOSABS 0.1 07/12/2011 0919    CMP Latest Ref Rng & Units 02/25/2020 02/24/2020 02/23/2020  Glucose 70 - 99 mg/dL 407(H) 387(H) 406(H)  BUN 8 - 23 mg/dL 26(H) 33(H) 31(H)  Creatinine 0.61 - 1.24 mg/dL 1.06 1.32(H) 1.34(H)  Sodium 135 - 145 mmol/L 133(L) 130(L) 131(L)  Potassium 3.5 - 5.1 mmol/L 4.3 4.1 4.6  Chloride 98 - 111 mmol/L 100 98 96(L)  CO2 22 - 32 mmol/L 26 24 25   Calcium 8.9 - 10.3 mg/dL 7.7(L) 7.8(L) 8.4(L)  Total Protein 6.5 - 8.1 g/dL - 5.8(L) 6.7  Total Bilirubin 0.3 - 1.2 mg/dL - 0.7 0.9    Alkaline Phos 38 - 126 U/L - 75 83  AST 15 - 41 U/L - 15 17  ALT 0 - 44 U/L - 16 19      Microbiology: Recent Results (from the past 240 hour(s))  Culture, blood (routine x 2)     Status: None (Preliminary result)   Collection Time: 02/23/20 10:58 AM   Specimen: BLOOD  Result Value Ref Range Status   Specimen Description BLOOD BLOOD RIGHT FOREARM  Final   Special Requests   Final    BOTTLES DRAWN AEROBIC AND ANAEROBIC Blood Culture adequate volume   Culture   Final    NO GROWTH 2 DAYS Performed at [facility], [address] [place], [place] [address]    Report Status PENDING  Incomplete  Culture, blood (routine x 2)     Status: None (Preliminary result)   Collection Time: 02/23/20 10:58 AM   Specimen: BLOOD  Result Value Ref Range Status   Specimen Description BLOOD Blood Culture adequate volume  Final   Special Requests   Final    BOTTLES DRAWN AEROBIC AND ANAEROBIC LEFT ANTECUBITAL   Culture   Final    NO GROWTH 2 DAYS Performed at [facility], [address] [place], [place] [address]    Report Status PENDING  Incomplete  Respiratory Panel by RT PCR (Flu A&B, Covid) - Nasopharyngeal Swab     Status: None   Collection Time: 02/23/20 11:59 AM   Specimen: Nasopharyngeal Swab  Result Value Ref Range Status   SARS Coronavirus 2 by RT PCR NEGATIVE NEGATIVE Final    Comment: (NOTE) SARS-CoV-2 target nucleic acids are NOT DETECTED.  The SARS-CoV-2 RNA is generally detectable in upper respiratoy specimens during the acute phase of infection. The lowest concentration of SARS-CoV-2 viral copies this assay can detect is 131 copies/mL. A negative result does not preclude SARS-Cov-2 infection and should not be used as the sole basis for treatment or other patient management decisions. A negative result may occur with  improper specimen collection/handling, submission of  specimen other than nasopharyngeal swab, presence of viral mutation(s) within  the areas targeted by this assay, and inadequate number of viral copies (<131 copies/mL). A negative result must be combined with clinical observations, patient history, and epidemiological information. The expected result is Negative.  Fact Sheet for Patients:  PinkCheek.be  Fact Sheet for Healthcare Providers:  GravelBags.it  This test is no t yet approved or cleared by the [place] FDA and  has been authorized for detection and/or diagnosis of SARS-CoV-2 by FDA under an Emergency Use Authorization (EUA). This EUA will remain  in effect (meaning this test can be used) for the duration of the COVID-19 declaration under Section 564(b)(1) of the Act, 21 U.S.C. section 360bbb-3(b)(1), unless the authorization is terminated or revoked sooner.     Influenza A by PCR NEGATIVE NEGATIVE Final   Influenza B by PCR NEGATIVE NEGATIVE Final    Comment: (NOTE) The Xpert Xpress SARS-CoV-2/FLU/RSV assay is intended as an aid in  the diagnosis of influenza from Nasopharyngeal swab specimens and  should not be used as a sole basis for treatment. Nasal washings and  aspirates are unacceptable for Xpert Xpress SARS-CoV-2/FLU/RSV  testing.  Fact Sheet for Patients: PinkCheek.be  Fact Sheet for Healthcare Providers: GravelBags.it  This test is not yet approved or cleared by the [place] FDA and  has been authorized for detection and/or diagnosis of SARS-CoV-2 by  FDA under an Emergency Use Authorization (EUA). This EUA will remain  in effect (meaning this test can be used) for the duration of the  Covid-19 declaration under Section 564(b)(1) of the Act, 21  U.S.C. section 360bbb-3(b)(1), unless the authorization is  terminated or revoked. Performed at [facility], [address], [place], [place] [address]   Surgical pcr screen     Status: None   Collection  Time: 02/24/20 12:25 PM   Specimen: Nasal Mucosa; Nasal Swab  Result Value Ref Range Status   MRSA, PCR NEGATIVE NEGATIVE Final   Staphylococcus aureus NEGATIVE NEGATIVE Final    Comment: (NOTE) The Xpert SA Assay (FDA approved for NASAL specimens in patients 8 years of age and older), is one component of a comprehensive surveillance program. It is not intended to diagnose infection nor to guide or monitor treatment. Performed at [facility], [address] [place], [place] [address]   Aerobic/Anaerobic Culture (surgical/deep wound)     Status: None (Preliminary result)   Collection Time: 02/24/20  5:44 PM   Specimen: PATH Other; Body Fluid  Result Value Ref Range Status   Specimen Description   Final    WOUND 3RD RAY LEFT FOOT SAMPLE A Performed at [facility], [address], [place], [place] [address]    Special Requests   Final    NONE Performed at [facility], [place], [place] [address]    Gram Stain   Final    NO WBC SEEN ABUNDANT GRAM POSITIVE COCCI FEW GRAM NEGATIVE DIPLOCOCCI Performed at [facility], [place] [address], [place], [place] [address]    Culture PENDING  Incomplete   Report Status PENDING  Incomplete    IMAGING RESULTS: MRI reviewed I have personally reviewed the films ? Impression/Recommendation ? Left foot infection in a patient with diabetes mellitus/neuropathy and peripheral artery disease. Status post ray excision of the third toe.  he may need further surgery because of some nonviable tissue.  Status post angioplasty Currently on Vanco and Zosyn.  We will change  the vancomycin to linezolid.   Diabetes mellitus.  Poorly controlled.  Hemoglobin A1c more than 9. Currently on insulin.  AKI.  Will avoid Vanco Zosyn combination.  CAD status post CABG Recent stent placement to the RCA.  On Plavix and aspirin. Hyperlipidemia on atorvastatin Hypertension on metoprolol. Patient seen with Dr.  Cleda Mccreedy? ___________________________________________________ Discussed with patient, and his partner. Note:  This document was prepared using Dragon voice recognition software and may include unintentional dictation errors.

## 2020-02-25 NOTE — Interval H&P Note (Signed)
History and Physical Interval Note:  02/25/2020 10:59 AM  [name].  has presented today for surgery, with the diagnosis of Atherosclerotic Disease With Gangrene.  The various methods of treatment have been discussed with the patient and family. After consideration of risks, benefits and other options for treatment, the patient has consented to  Procedure(s): Lower Extremity Angiography (Left) as a surgical intervention.  The patient's history has been reviewed, patient examined, no change in status, stable for surgery.  I have reviewed the patient's chart and labs.  Questions were answered to the patient's satisfaction.     [name]

## 2020-02-25 NOTE — Progress Notes (Signed)
Day of Surgery   Subjective/Chief Complaint: Patient seen.  States he is feeling okay and some better than yesterday.  Vascular intervention performed today.   Objective: Vital signs in last 24 hours: Temp:  [97.1 F (36.2 C)-99.5 F (37.5 C)] 98.9 F (37.2 C) (10/28 1713) Pulse Rate:  [80-106] 96 (10/28 1713) Resp:  [10-20] 18 (10/28 1713) BP: (92-144)/(48-110) 128/73 (10/28 1713) SpO2:  [91 %-99 %] 99 % (10/28 1713) Weight:  [139.9 kg] 139.9 kg (10/28 1108) Last BM Date: 02/22/20  Intake/Output from previous day: 10/27 0701 - 10/28 0700 In: 1618.6 [I.V.:779.9; IV Piggyback:838.8] Out: 10 [Blood:10] Intake/Output this shift: Total I/O In: 369.4 [I.V.:161.9; IV Piggyback:207.5] Out: -   Bandage on the left foot is dry and intact.  Upon removal there is heavy bleeding noted along the dorsal and plantar aspect of the foot.  Upon removal incision appears coapted.  Some significant edema still in the foot with ecchymotic appearance along the dorsum of the foot.  Fourth toe also appears a little bit dusky as compared to the other toes although capillary refill intact.      Lab Results:  Recent Labs    02/24/20 0429 02/25/20 0455  WBC 14.3* 11.1*  HGB 11.4* 11.5*  HCT 34.0* 35.1*  PLT 155 146*   BMET Recent Labs    02/24/20 0429 02/25/20 0455  NA 130* 133*  K 4.1 4.3  CL 98 100  CO2 24 26  GLUCOSE 387* 407*  BUN 33* 26*  CREATININE 1.32* 1.06  CALCIUM 7.8* 7.7*   PT/INR Recent Labs    02/24/20 0429  LABPROT 15.9*  INR 1.3*   ABG No results for input(s): PHART, HCO3 in the last 72 hours.  Invalid input(s): PCO2, PO2  Studies/Results: PERIPHERAL VASCULAR CATHETERIZATION  Result Date: 02/25/2020 See op note  DG Foot Complete Left  Result Date: 02/25/2020 CLINICAL DATA:  Status post partial third ray amputation. EXAM: LEFT FOOT - COMPLETE 3+ VIEW COMPARISON:  Radiographs 02/23/2020 FINDINGS: There is a sharp osteotomy defect involving the midshaft  region of the third metatarsal consistent with recent amputation. No complicating features are identified. The other bony structures are intact. Diffuse soft tissue swelling and scattered soft tissue calcifications. IMPRESSION: Status post partial third ray amputation. Electronically Signed   By: Marijo Sanes M.D.   On: 02/25/2020 15:39    Anti-infectives: Anti-infectives (From admission, onward)   Start     Dose/Rate Route Frequency Ordered Stop   02/25/20 2200  linezolid (ZYVOX) tablet 600 mg        600 mg Oral Every 12 hours 02/25/20 1739     02/24/20 1748  vancomycin (VANCOCIN) powder  Status:  Discontinued          As needed 02/24/20 1748 02/24/20 1825   02/23/20 2000  vancomycin (VANCOCIN) IVPB 1000 mg/200 mL premix  Status:  Discontinued        1,000 mg 200 mL/hr over 60 Minutes Intravenous Every 12 hours 02/23/20 1602 02/25/20 1739   02/23/20 1800  piperacillin-tazobactam (ZOSYN) IVPB 3.375 g        3.375 g 12.5 mL/hr over 240 Minutes Intravenous Every 8 hours 02/23/20 1600     02/23/20 1030  vancomycin (VANCOCIN) IVPB 1000 mg/200 mL premix        1,000 mg 200 mL/hr over 60 Minutes Intravenous  Once 02/23/20 1025 02/23/20 1302   02/23/20 1030  piperacillin-tazobactam (ZOSYN) IVPB 3.375 g        3.375 g 100 mL/hr  over 30 Minutes Intravenous  Once 02/23/20 1025 02/23/20 1159      Assessment/Plan: s/p Procedure(s): Lower Extremity Angiography (Left) Assessment: Condition guarded status post ray resection left foot.   Plan: Iodoform gauze packed within the dorsal aspect of the foot followed by a bulky gauze bandage.  Patient seen in consultation with infectious disease.  Discussed with the patient that he most likely will need a secondary I&D as there may be some continued infection.  Will discuss the patient with Dr. Luana Shu.  Continue on antibiotics.  LOS: 2 days    Durward Fortes 02/25/2020

## 2020-02-25 NOTE — Plan of Care (Signed)
  Problem: Health Behavior/Discharge Planning: Goal: Ability to manage health-related needs will improve Outcome: Progressing   

## 2020-02-25 NOTE — Op Note (Signed)
[facility]  Percutaneous Study/Intervention Procedural Note   Date of Surgery: 02/25/2020  Surgeon(s):Shannyn Jankowiak    Assistants:none  Pre-operative Diagnosis: PAD with ulceration LLE  Post-operative diagnosis:  Same  Procedure(s) Performed:             1.  Ultrasound guidance for vascular access right femoral artery             2.  Catheter placement into left common femoral artery from right femoral approach             3.  Aortogram and selective left lower extremity angiogram             4.  Percutaneous transluminal angioplasty of left posterior tibial artery with 3 mm diameter conventional and 4 mm diameter Lutonix drug-coated angioplasty balloon             5.   Percutaneous transluminal angioplasty of the left popliteal artery and proximal tibioperoneal trunk with 5 mm diameter Lutonix drug-coated angioplasty balloon  6.   StarClose closure device right femoral artery  EBL: 5 cc  Contrast: 50 cc  Fluoro Time: 3.9 minutes  Moderate Conscious Sedation Time: approximately 45 minutes using 4 mg of Versed and 100 mcg of Fentanyl              Indications:  Patient is a 64 y.o.male with nonhealing ulceration of the left foot. The patient is brought in for angiography for further evaluation and potential treatment.  Due to the limb threatening nature of the situation, angiogram was performed for attempted limb salvage. The patient is aware that if the procedure fails, amputation would be expected.  The patient also understands that even with successful revascularization, amputation may still be required due to the severity of the situation.  Risks and benefits are discussed and informed consent is obtained.   Procedure:  The patient was identified and appropriate procedural time out was performed.  The patient was then placed supine on the table and prepped and draped in the usual sterile fashion. Moderate conscious sedation was administered during a face to  face encounter with the patient throughout the procedure with my supervision of the RN administering medicines and monitoring the patient's vital signs, pulse oximetry, telemetry and mental status throughout from the start of the procedure until the patient was taken to the recovery room. Ultrasound was used to evaluate the right common femoral artery.  It was patent .  A digital ultrasound image was acquired.  A Seldinger needle was used to access the right common femoral artery under direct ultrasound guidance and a permanent image was performed.  A 0.035 J wire was advanced without resistance and a 5Fr sheath was placed.  Pigtail catheter was placed into the aorta and an AP aortogram was performed. This demonstrated moderate right renal artery stenosis, left renal artery with no stenosis, aorta and iliac arteries are calcific but not stenotic. I then crossed the aortic bifurcation and advanced to the left femoral head. Selective left lower extremity angiogram was then performed. This demonstrated calcified but not stenotic left common femoral artery, profunda femoris artery, and superficial femoral artery.  There is a mild stenosis in the 30 to 40% range at Hunter's canal.  The below-knee popliteal artery had moderate stenosis in the 60 to 65% range tracking down into the proximal tibioperoneal trunk.  The anterior tibial artery was diseased proximally and occluded after several centimeters and did not reconstitute distally.  The peroneal artery was  of good size and did not have any focal stenosis.  The posterior tibial artery had moderate to severe stenosis in the 70 to 75% range in the proximal segment and then normalized for 5 to 10 cm and then had another area of stenosis in the 80% range in the mid posterior tibial artery.  It was then continuous to the foot. It was felt that it was in the patient's best interest to proceed with intervention after these images to avoid a second procedure and a larger amount  of contrast and fluoroscopy based off of the findings from the initial angiogram. The patient was systemically heparinized and a 6 [place] Ansell sheath was then placed over the [employer] wire. I then used a Kumpe catheter and the advantage wire to navigate down through the popliteal artery and into the posterior tibial artery were exchanged for a 0.018 wire and parked this at the ankle.  I then treated the popliteal artery and the proximal tibioperoneal trunk with a 5 mm diameter by 10 cm length Lutonix drug-coated angioplasty balloon inflated to 8 atm for 1 minute.  The posterior tibial artery was initially treated with a 4 mm diameter by 22 cm length Lutonix drug-coated angioplasty balloon inflated to 6 atm for 1 minute, and when there appeared to be some residual disease and possible dissection in the proximal segment, I then exchanged for a 3.5 mm diameter by 30 cm length angioplasty balloon and inflated this to 6 atm for 1 minute.  Completion imaging following this showed marked improvement with less than 20% residual stenosis in the posterior tibial artery after treatment.  There is also some spasm that we treated with intra-arterial nitroglycerin prior to the completion image. I elected to terminate the procedure. The sheath was removed and StarClose closure device was deployed in the right femoral artery with excellent hemostatic result. The patient was taken to the recovery room in stable condition having tolerated the procedure well.  Findings:               Aortogram:  moderate right renal artery stenosis, left renal artery with no stenosis, aorta and iliac arteries are calcific but not stenotic.             Left lower Extremity:  This demonstrated calcified but not stenotic left common femoral artery, profunda femoris artery, and superficial femoral artery.  There is a mild stenosis in the 30 to 40% range at Hunter's canal.  The below-knee popliteal artery had moderate stenosis in the 60 to  65% range tracking down into the proximal tibioperoneal trunk.  The anterior tibial artery was diseased proximally and occluded after several centimeters and did not reconstitute distally.  The peroneal artery was of good size and did not have any focal stenosis.  The posterior tibial artery had moderate to severe stenosis in the 70 to 75% range in the proximal segment and then normalized for 5 to 10 cm and then had another area of stenosis in the 80% range in the mid posterior tibial artery.  It was then continuous to the foot.   Disposition: Patient was taken to the recovery room in stable condition having tolerated the procedure well.  Complications: None  [name] 02/25/2020 12:15 PM   This note was created with Dragon Medical transcription system. Any errors in dictation are purely unintentional.

## 2020-02-25 NOTE — Progress Notes (Signed)
Report called to [facility] , pt tolerated sandwich/gingerale while in recovery. Right femoral site c/d/i  No s/s hematoma noted, pulses with doppler present. No c/o's

## 2020-02-25 NOTE — Progress Notes (Signed)
Inpatient Diabetes Program Recommendations  AACE/ADA: New Consensus Statement on Inpatient Glycemic Control   Target Ranges:  Prepandial:   less than 140 mg/dL      Peak postprandial:   less than 180 mg/dL (1-2 hours)      Critically ill patients:  140 - 180 mg/dL   Results for [name], [name] (MRN [id number]) as of 02/25/2020 08:23  Ref. Range 02/24/2020 07:45 02/24/2020 11:29 02/24/2020 18:29 02/24/2020 21:27 02/25/2020 07:47  Glucose-Capillary Latest Ref Range: 70 - 99 mg/dL 349 (H) 337 (H) 288 (H) 296 (H) 411 (H)  Results for [name], [name] (MRN [id number]) as of 02/25/2020 08:23  Ref. Range 02/24/2020 04:29  Hemoglobin A1C Latest Ref Range: 4.8 - 5.6 % 9.7 (H)   Review of Glycemic Control  Diabetes history: DM2 Outpatient Diabetes medications: Lantus 26 units BID, Metformin 1000 mg BID Current orders for Inpatient glycemic control: Lantus 25 units BID, Novolog 5 units TID with meals, Novolog 0-15 units TID with meals  Inpatient Diabetes Program Recommendations:    HbgA1C: A1C 9.7% on 02/24/20 indicating an average glucose of 232 mg/dl over the past 2-3 months.  NOTE: Noted Lantus increased and meal coverage added this morning.  Inpatient Diabetes Coordinator spoke with patient on 02/03/20 during prior admission. Patient had reported that his mother recently passed away and that he took care of her for 3 months and during that time he did not keep DM well controlled as he was busy taking care of his mother. Patient also reported he use to be on Humulin R U500 in the past and he started a ketogenic diet about 3 years ago and was able to lose weight (has lost 180 pounds over the past 3 years). Will continue to follow along while inpatient.  Thanks, Barnie Alderman, RN, MSN, CDE Diabetes Coordinator Inpatient Diabetes Program [phone] (Team Pager from 8am to 5pm)

## 2020-02-26 LAB — GLUCOSE, CAPILLARY
Glucose-Capillary: 265 mg/dL — ABNORMAL HIGH (ref 70–99)
Glucose-Capillary: 288 mg/dL — ABNORMAL HIGH (ref 70–99)
Glucose-Capillary: 302 mg/dL — ABNORMAL HIGH (ref 70–99)
Glucose-Capillary: 258 mg/dL — ABNORMAL HIGH (ref 70–99)

## 2020-02-26 LAB — BASIC METABOLIC PANEL
Anion gap: 9 (ref 5–15)
BUN: 22 mg/dL (ref 8–23)
CO2: 23 mmol/L (ref 22–32)
Calcium: 7.8 mg/dL — ABNORMAL LOW (ref 8.9–10.3)
Chloride: 102 mmol/L (ref 98–111)
Creatinine, Ser: 0.97 mg/dL (ref 0.61–1.24)
GFR, Estimated: >60 mL/min (ref >60)
Glucose, Bld: 344 mg/dL — ABNORMAL HIGH (ref 70–99)
Potassium: 4.5 mmol/L (ref 3.5–5.1)
Sodium: 134 mmol/L — ABNORMAL LOW (ref 135–145)

## 2020-02-26 MED ORDER — INSULIN ASPART 100 UNIT/ML ~~LOC~~ SOLN
10.0000 [IU] | Freq: Three times a day (TID) | SUBCUTANEOUS | Status: DC
  Administered 2020-02-26 – 2020-02-27 (×2): 10 [IU] via SUBCUTANEOUS
  Filled 2020-02-26 (×2): qty 1

## 2020-02-26 MED ORDER — INSULIN GLARGINE 100 UNIT/ML ~~LOC~~ SOLN
35.0000 [IU] | Freq: Two times a day (BID) | SUBCUTANEOUS | Status: DC
  Administered 2020-02-26 – 2020-02-27 (×3): 35 [IU] via SUBCUTANEOUS
  Filled 2020-02-26 (×4): qty 0.35

## 2020-02-26 MED ORDER — INSULIN ASPART 100 UNIT/ML ~~LOC~~ SOLN
8.0000 [IU] | Freq: Three times a day (TID) | SUBCUTANEOUS | Status: DC
  Administered 2020-02-26: 8 [IU] via SUBCUTANEOUS
  Filled 2020-02-26: qty 1

## 2020-02-26 NOTE — Progress Notes (Signed)
Patient resting in bed. He has not experienced any chest discomfort or pain this evening thus far. RN will continue to monitor for changes.

## 2020-02-26 NOTE — Progress Notes (Signed)
PROGRESS NOTE    [name].  SEG:[id number] DOB: November 24, 1955 DOA: 02/23/2020 PCP: [name], MD    Brief Narrative:   [name]. is a 64 y.o. male with medical history significant for peripheral artery disease on chronic Eliquis therapy, coronary artery disease status post PCI with drug-eluting stent to the mid RCA on 02/03/2020, type 2 diabetes mellitus, hypertension, obstructive sleep apnea compliant on home nocturnal CPAP, who is admitted to [facility] on 02/23/2020 with severe sepsis due to left lower extremity cellulitis after presenting from home to [facility] complaining of left lower extremity erythema.  The patient confirms that he underwent coronary angiography on 02/03/2020 which revealed 90% stenosis of the mid RCA which time he underwent PCI with drug-eluting stent placement to the mid RCA.  Per my review of the ensuing cardiology note, the patient is to remain on on dual antiplatelet therapy without interruption for the next 1 year.  Denies any ensuing chest pain, shortness of breath, palpitations, diaphoresis, dizziness, presyncope, or syncope  10/29-BG levels high in 300's.   Consultants:    podiatry, vascular surgery  Procedures:   Antimicrobials:   zosyn  Vancomycin  Subjective: Pt denies cp, chest pressure, sob, fever, chills   Objective: Vitals:   02/25/20 2054 02/25/20 2348 02/26/20 0451 02/26/20 0508  BP: (!) 145/71 120/63 116/70   Pulse: (!) 104 83 77   Resp: 18 16 16    Temp:  98.5 F (36.9 C) 97.8 F (36.6 C)   TempSrc:  Oral Oral   SpO2: 100% 96% 97%   Weight:    (!) 141.2 kg  Height:        Intake/Output Summary (Last 24 hours) at 02/26/2020 0805 Last data filed at 02/26/2020 0604 Gross per 24 hour  Intake 609.35 ml  Output 600 ml  Net 9.35 ml   Filed Weights   02/25/20 0401 02/25/20 1108 02/26/20 0508  Weight: (!) 139.9 kg (!) 139.9 kg (!) 141.2 kg     Examination:   Calm, comfortable CTA, no wheeze rales rhonchi's RRR S1-2 no murmurs rubs Soft benign positive bowel sounds Mild edema bilaterally, left foot wrapped Alert oriented x3, grossly intact Mood and affect appropriate in current setting   Data Reviewed: I have personally reviewed following labs and imaging studies  CBC: Recent Labs  Lab 02/23/20 1058 02/24/20 0429 02/25/20 0455  WBC 16.3* 14.3* 11.1*  NEUTROABS 14.1* 12.0*  --   HGB 12.8* 11.4* 11.5*  HCT 38.9* 34.0* 35.1*  MCV 92.6 91.9 92.9  PLT 161 155 737*   Basic Metabolic Panel: Recent Labs  Lab 02/23/20 1058 02/24/20 0429 02/25/20 0455 02/26/20 0427  NA 131* 130* 133* 134*  K 4.6 4.1 4.3 4.5  CL 96* 98 100 102  CO2 25 24 26 23   GLUCOSE 406* 387* 407* 344*  BUN 31* 33* 26* 22  CREATININE 1.34* 1.32* 1.06 0.97  CALCIUM 8.4* 7.8* 7.7* 7.8*  MG 1.8 1.8  --   --    GFR: Estimated Creatinine Clearance: 116.6 mL/min (by C-G formula based on SCr of 0.97 mg/dL). Liver Function Tests: Recent Labs  Lab 02/23/20 1058 02/24/20 0429  AST 17 15  ALT 19 16  ALKPHOS 83 75  BILITOT 0.9 0.7  PROT 6.7 5.8*  ALBUMIN 3.1* 2.6*   No results for input(s): LIPASE, AMYLASE in the last 168 hours. No results for input(s): AMMONIA in the last 168 hours. Coagulation Profile: Recent Labs  Lab 02/24/20 0429  INR 1.3*   Cardiac Enzymes: No results for input(s): CKTOTAL, CKMB, CKMBINDEX, TROPONINI in the last 168 hours. BNP (last 3 results) No results for input(s): PROBNP in the last 8760 hours. HbA1C: Recent Labs    02/24/20 0429  HGBA1C 9.7*   CBG: Recent Labs  Lab 02/25/20 1106 02/25/20 1234 02/25/20 1352 02/25/20 1641 02/25/20 2141  GLUCAP 386* 314* 310* 347* 332*   Lipid Profile: No results for input(s): CHOL, HDL, LDLCALC, TRIG, CHOLHDL, LDLDIRECT in the last 72 hours. Thyroid Function Tests: No results for input(s): TSH, T4TOTAL, FREET4, T3FREE, THYROIDAB in the last 72 hours. Anemia  Panel: No results for input(s): VITAMINB12, FOLATE, FERRITIN, TIBC, IRON, RETICCTPCT in the last 72 hours. Sepsis Labs: Recent Labs  Lab 02/23/20 1058 02/23/20 1454  LATICACIDVEN 2.1* 1.5    Recent Results (from the past 240 hour(s))  Culture, blood (routine x 2)     Status: None (Preliminary result)   Collection Time: 02/23/20 10:58 AM   Specimen: BLOOD  Result Value Ref Range Status   Specimen Description BLOOD BLOOD RIGHT FOREARM  Final   Special Requests   Final    BOTTLES DRAWN AEROBIC AND ANAEROBIC Blood Culture adequate volume   Culture   Final    NO GROWTH 3 DAYS Performed at [facility] [place] [facility], [address] [place], [place] [address]    Report Status PENDING  Incomplete  Culture, blood (routine x 2)     Status: None (Preliminary result)   Collection Time: 02/23/20 10:58 AM   Specimen: BLOOD  Result Value Ref Range Status   Specimen Description BLOOD Blood Culture adequate volume  Final   Special Requests   Final    BOTTLES DRAWN AEROBIC AND ANAEROBIC LEFT ANTECUBITAL   Culture   Final    NO GROWTH 3 DAYS Performed at [facility], [address], [place], [place] [address]    Report Status PENDING  Incomplete  Respiratory Panel by RT PCR (Flu A&B, Covid) - Nasopharyngeal Swab     Status: None   Collection Time: 02/23/20 11:59 AM   Specimen: Nasopharyngeal Swab  Result Value Ref Range Status   SARS Coronavirus 2 by RT PCR NEGATIVE NEGATIVE Final    Comment: (NOTE) SARS-CoV-2 target nucleic acids are NOT DETECTED.  The SARS-CoV-2 RNA is generally detectable in upper respiratoy specimens during the acute phase of infection. The lowest concentration of SARS-CoV-2 viral copies this assay can detect is 131 copies/mL. A negative result does not preclude SARS-Cov-2 infection and should not be used as the sole basis for treatment or other patient management decisions. A negative result may occur with  improper specimen collection/handling, submission  of specimen other than nasopharyngeal swab, presence of viral mutation(s) within the areas targeted by this assay, and inadequate number of viral copies (<131 copies/mL). A negative result must be combined with clinical observations, patient history, and epidemiological information. The expected result is Negative.  Fact Sheet for Patients:  PinkCheek.be  Fact Sheet for Healthcare Providers:  GravelBags.it  This test is no t yet approved or cleared by the [place] FDA and  has been authorized for detection and/or diagnosis of SARS-CoV-2 by FDA under an Emergency Use Authorization (EUA). This EUA will remain  in effect (meaning this test can be used) for the duration of the COVID-19 declaration under Section 564(b)(1) of the Act, 21 U.S.C. section 360bbb-3(b)(1), unless the authorization is terminated or revoked sooner.     Influenza A by PCR  NEGATIVE NEGATIVE Final   Influenza B by PCR NEGATIVE NEGATIVE Final    Comment: (NOTE) The Xpert Xpress SARS-CoV-2/FLU/RSV assay is intended as an aid in  the diagnosis of influenza from Nasopharyngeal swab specimens and  should not be used as a sole basis for treatment. Nasal washings and  aspirates are unacceptable for Xpert Xpress SARS-CoV-2/FLU/RSV  testing.  Fact Sheet for Patients: PinkCheek.be  Fact Sheet for Healthcare Providers: GravelBags.it  This test is not yet approved or cleared by the [place] FDA and  has been authorized for detection and/or diagnosis of SARS-CoV-2 by  FDA under an Emergency Use Authorization (EUA). This EUA will remain  in effect (meaning this test can be used) for the duration of the  Covid-19 declaration under Section 564(b)(1) of the Act, 21  U.S.C. section 360bbb-3(b)(1), unless the authorization is  terminated or revoked. Performed at [facility], [address], [place], [place] [address]   Surgical pcr screen     Status: None   Collection Time: 02/24/20 12:25 PM   Specimen: Nasal Mucosa; Nasal Swab  Result Value Ref Range Status   MRSA, PCR NEGATIVE NEGATIVE Final   Staphylococcus aureus NEGATIVE NEGATIVE Final    Comment: (NOTE) The Xpert SA Assay (FDA approved for NASAL specimens in patients 28 years of age and older), is one component of a comprehensive surveillance program. It is not intended to diagnose infection nor to guide or monitor treatment. Performed at [facility], [address] [place], [place] [address]   Aerobic/Anaerobic Culture (surgical/deep wound)     Status: None (Preliminary result)   Collection Time: 02/24/20  5:44 PM   Specimen: PATH Other; Body Fluid  Result Value Ref Range Status   Specimen Description   Final    WOUND 3RD RAY LEFT FOOT SAMPLE A Performed at [facility], [address], [place], [place] [address]    Special Requests   Final    NONE Performed at [facility], [place], [place] [address]    Gram Stain   Final    NO WBC SEEN ABUNDANT GRAM POSITIVE COCCI FEW GRAM NEGATIVE DIPLOCOCCI Performed at [facility], [place] [address] [place],  [address]    Culture PENDING  Incomplete   Report Status PENDING  Incomplete         Radiology Studies: PERIPHERAL VASCULAR CATHETERIZATION  Result Date: 02/25/2020 See op note  DG Foot Complete Left  Result Date: 02/25/2020 CLINICAL DATA:  Status post partial third ray amputation. EXAM: LEFT FOOT - COMPLETE 3+ VIEW COMPARISON:  Radiographs 02/23/2020 FINDINGS: There is a sharp osteotomy defect involving the midshaft region of the third metatarsal consistent with recent amputation. No complicating features are identified. The other bony structures are intact. Diffuse soft tissue swelling and scattered soft tissue calcifications. IMPRESSION: Status post partial third ray amputation. Electronically  Signed   By: Marijo Sanes M.D.   On: 02/25/2020 15:39        Scheduled Meds: . acidophilus  1 capsule Oral Daily  . apixaban  5 mg Oral BID  . aspirin EC  81 mg Oral Daily  . atorvastatin  40 mg Oral QHS  . clopidogrel  75 mg Oral Q breakfast  . insulin aspart  0-15 Units Subcutaneous TID WC  . insulin aspart  8 Units Subcutaneous TID WC  . insulin glargine  35 Units Subcutaneous BID  . linezolid  600 mg Oral Q12H  . metoprolol  tartrate  50 mg Oral BID  . pramipexole  1 mg Oral QHS  . primidone  250 mg Oral BID  . tamsulosin  0.4 mg Oral Daily   Continuous Infusions: . sodium chloride 75 mL/hr at 02/25/20 1749  . piperacillin-tazobactam (ZOSYN)  IV 3.375 g (02/26/20 0225)    Assessment & Plan:   Principal Problem:   Cellulitis of left lower extremity Active Problems:   Diabetes mellitus type 2 in obese (HCC)   PAD (peripheral artery disease) (HCC)   CAD (coronary artery disease)   Severe sepsis (HCC)   AKI (acute kidney injury) ([place])   #)  sepsis due to cellulitis/left third MTPJ ulceration with gas gangrene:  plain films and MRI showing evidence of subcutaneous swelling consistent with cellulitis in the absence of any evidence of acute osteomyelitis.   -Leukocytosis and lactic acid improved Cultures pending Status post left partial third ray amputation by Dr. Luana Shu on 10/27 10/29-status post angiogram by Dr. Lucky Cowboy on 10/28.  Status post angioplasty of the left posterior tibial artery with drug-coated angioplasty balloon, angioplasty of the left popliteal artery and proximal tibioperoneal trunk als0 -Per podiatry patient will likely need a secondary I&D as there may be some continued infection and Dr. Caryl Comes will discuss this with Dr. Luana Shu. ID following-Vanco was switched to linezolid and will continue with Zosyn      #) Acute kidney injury: Presenting creatinine noted to be 1.34 relative to most recent prior value of 0.823 when checked on 02/03/2020.   The renal  secondary to intravascular depletion/severe sepsis with exacerbation of outpatient losartan  ARB on hold 10/29-creatinine stable at 0.97 today.  Will discontinue IV fluids     #) Obstructive sleep apnea: On CPAP at home and reports compliance     #) Type 2 diabetes mellitus with hyperglycemia-uncontrolled Hemoglobin A1c 9.7 Discussed with patient importance of controlling his diabetes as outpatient 10/29-bg levels elevated. Likely infection attributing to this too Increase Lantus to 35units bid Increase novolog to 10 units with meals tid Riss       #) Peripheral artery disease/CAD: Patient had coronary cath on 02/03/2020 status post DES to mid RCA S/p angiogram on 10/28 Status post angiogram on 02/25/2020-please see #1.  Status post angioplasty. Continue with Eliquis and Plavix Will need to continue without interruption for 1 year Continue statin and beta-blockers Patient was inquiring about his Imdur, told patient that we are unable to reinstitute his Imdur as his blood pressure will not tolerate it.  He verbalizes an understanding.  Currently he is asymptomatic.     #) BPH: On Flomax      DVT prophylaxis: Eliquis Code Status: Full Family Communication: Wife at bedside  Status is: Inpatient  Remains inpatient appropriate because:IV treatments appropriate due to intensity of illness or inability to take PO   Dispo: The patient is from: Home              Anticipated d/c is to: Home              Anticipated d/c date is: 3 days              Patient currently is not medically stable to d/c. needs I/D. Needs ivabx             LOS: 3 days   Time spent: 35 min with >50% on coc   Nolberto Hanlon, MD [facility] Pager [phone]  If 7PM-7AM, please contact night-coverage [email or web address] Password [email or web address] 02/26/2020, 8:05 AM

## 2020-02-26 NOTE — Plan of Care (Signed)

## 2020-02-26 NOTE — Progress Notes (Signed)
ID Chart reviewed       Saw Dr.Baker's note Pt with left foot infection- s/p 3rd toe amputaion PAD GBS in surgical wound Currently on linezolid and zosyn- if culture finalize will de-escalate accordingly Awaiting demarcation of the wound for further surgery as per Dr. Luana Shu ID will follow him peripherally this weekend- call if needed

## 2020-02-26 NOTE — Plan of Care (Signed)

## 2020-02-26 NOTE — Progress Notes (Signed)
Pharmacy Antibiotic Note  [name]. is a 64 y.o. male admitted on 02/23/2020 with sepsis and cellulitis.  Pharmacy consulted for Vancomycin and Zosyn dosing. Vancomycin was changed to linezolid 10/28.  Day 4 IV abx    Plan: Continue Zosyn 3.375g IV q8h extended infusion.  Monitor renal function closely.  Height: 6\' 4"  (193 cm) Weight: (!) 141.2 kg (311 lb 3.2 oz) IBW/kg (Calculated) : 86.8  Temp (24hrs), Avg:98.2 F (36.8 C), Min:97.5 F (36.4 C), Max:99 F (37.2 C)  Recent Labs  Lab 02/23/20 1058 02/23/20 1454 02/24/20 0429 02/25/20 0455 02/26/20 0427  WBC 16.3*  --  14.3* 11.1*  --   CREATININE 1.34*  --  1.32* 1.06 0.97  LATICACIDVEN 2.1* 1.5  --   --   --     Estimated Creatinine Clearance: 116.6 mL/min (by C-G formula based on SCr of 0.97 mg/dL).    No Known Allergies  Antimicrobials this admission: vancomcyin 10/26 >> 10/28 zosyn 10/26 >>  Linezolid 10/28>>   Microbiology results: 10/28 Left Foot Cx: Abundant  GPC, few GR Diplococci  10/26 Bcx: NG TD 10/27 MRSA PCR: negative   Thank you for allowing pharmacy to be a part of this patient's care.  Pernell Dupre, PharmD, BCPS Clinical Pharmacist 02/26/2020 8:45 AM

## 2020-02-26 NOTE — Progress Notes (Signed)
[facility] Daily Progress Note  Subjective: 02/25/20: 1. Ultrasound guidance for vascular access right femoral artery 2. Catheter placement into left common femoral artery from right femoral approach 3. Aortogram and selective left lower extremity angiogram 4. Percutaneous transluminal angioplasty of left posterior tibial artery with 3 mm diameter conventional and 4 mm diameter Lutonix drug-coated angioplasty balloon 5.  Percutaneous transluminal angioplasty of the left popliteal artery and proximal tibioperoneal trunk with 5 mm diameter Lutonix drug-coated angioplasty balloon             6.  StarClose closure device right femoral artery  Patient without complaint.  No issues overnight.  Objective: Vitals:   02/26/20 0451 02/26/20 0508 02/26/20 0811 02/26/20 1100  BP: 116/70  136/79 135/75  Pulse: 77  83 89  Resp: 16  17 18   Temp: 97.8 F (36.6 C)  98.7 F (37.1 C) 98.3 F (36.8 C)  TempSrc: Oral  Oral Oral  SpO2: 97%  99% 98%  Weight:  (!) 141.2 kg    Height:        Intake/Output Summary (Last 24 hours) at 02/26/2020 1505 Last data filed at 02/26/2020 1151 Gross per 24 hour  Intake 240 ml  Output 1225 ml  Net -985 ml   Physical Exam: A&Ox3, NAD CV: RRR Pulmonary: CTA Bilaterally Abdomen: Soft, Nontender, Nondistended Right groin:  Access site: Clean dry and intact.  No swelling or drainage. Vascular:  Left lower extremity: Thigh soft.  Calf soft.  Extremities warm distally toes.  Unable to palpate pedal pulses due to podiatry dressing intact.  The dressing is clean and dry.   Laboratory: CBC    Component Value Date/Time   WBC 11.1 (H) 02/25/2020 0455   HGB 11.5 (L) 02/25/2020 0455   HGB 14.7 07/12/2011 0919   HCT 35.1 (L) 02/25/2020 0455   HCT 45.4 07/12/2011 0919   PLT 146 (L) 02/25/2020 0455   PLT 156 07/12/2011 0919   BMET    Component Value Date/Time   NA 134 (L)  02/26/2020 0427   NA 141 07/12/2011 0919   K 4.5 02/26/2020 0427   K 4.4 07/12/2011 0919   CL 102 02/26/2020 0427   CL 102 07/12/2011 0919   CO2 23 02/26/2020 0427   CO2 29 07/12/2011 0919   GLUCOSE 344 (H) 02/26/2020 0427   GLUCOSE 76 07/12/2011 0919   BUN 22 02/26/2020 0427   BUN 16 05/05/2013 1022   CREATININE 0.97 02/26/2020 0427   CREATININE 0.71 05/05/2013 1022   CALCIUM 7.8 (L) 02/26/2020 0427   CALCIUM 8.8 07/12/2011 0919   GFRNONAA >60 02/26/2020 0427   GFRNONAA >60 05/05/2013 1022   GFRAA >60 01/06/2020 1853   GFRAA >60 05/05/2013 1022   Assessment/Planning: The patient is a 64 year old male with multiple medical issues including known atherosclerotic disease to the right lower extremity requiring endovascular intervention now presents with cellulitis/wounds to left lower extremity now status post endovascular intervention - POD#1  1) successful endovascular interventions left lower extremity. 2) on aspirin Plavix, Eliquis and statin for medical management 3) had long discussion with the patient in regard to being compliant.  He understands that he will most likely have to undergo repeat right lower extremity angiogram on the importance of continued monitoring of the left lower extremity.  Patient expresses understanding 4) vascular surgery will sign off at this time 5) we will continue to follow in the outpatient setting  Discussed with Dr. Ellis [facility] PA-C 02/26/2020 3:05 PM

## 2020-02-26 NOTE — Care Management Important Message (Signed)
Important Message  Patient Details  Name: [name]. MRN: [id number] Date of Birth: 1956/01/07   Medicare Important Message Given:  Yes     Juliann Pulse A Riyana Biel 02/26/2020, 11:11 AM

## 2020-02-26 NOTE — Progress Notes (Signed)
1 Day Post-Op   Subjective/Chief Complaint: Patient seen.  States he is feeling okay and some better than yesterday.  Vascular intervention performed yesterday.  Patient has minimal pain to his foot overall at this time and is feeling better in general.  Patient is trying to maintain nonweightbearing on the foot since procedure.  Bandage appears to be clean, dry, and intact since it was placed with Dr. Cleda Mccreedy yesterday.  Objective: Vital signs in last 24 hours: Temp:  [97.8 F (36.6 C)-98.7 F (37.1 C)] 98.4 F (36.9 C) (10/29 1547) Pulse Rate:  [76-104] 76 (10/29 1547) Resp:  [16-18] 17 (10/29 1547) BP: (116-145)/(63-79) 140/75 (10/29 1547) SpO2:  [96 %-100 %] 99 % (10/29 1547) Weight:  [141.2 kg] 141.2 kg (10/29 0508) Last BM Date: 02/26/20  Intake/Output from previous day: 10/28 0701 - 10/29 0700 In: 609.4 [P.O.:240; I.V.:161.9; IV Piggyback:207.5] Out: 600 [Urine:600] Intake/Output this shift: Total I/O In: -  Out: 450 [Urine:450]  Capillary fill time appears to be intact to digits on the left foot.  Appears to still have some dusky changes present around mainly the dorsal incision site with some maceration of tissues present.  No odor noted, decreased erythema and edema since procedure.  Appears to be slightly improved since previous visit.  No purulent drainage noted today.  Incision still appears to be well coapted with sutures intact.  Lab Results:  Recent Labs    02/24/20 0429 02/25/20 0455  WBC 14.3* 11.1*  HGB 11.4* 11.5*  HCT 34.0* 35.1*  PLT 155 146*   BMET Recent Labs    02/25/20 0455 02/26/20 0427  NA 133* 134*  K 4.3 4.5  CL 100 102  CO2 26 23  GLUCOSE 407* 344*  BUN 26* 22  CREATININE 1.06 0.97  CALCIUM 7.7* 7.8*   PT/INR Recent Labs    02/24/20 0429  LABPROT 15.9*  INR 1.3*   ABG No results for input(s): PHART, HCO3 in the last 72 hours.  Invalid input(s): PCO2, PO2  Studies/Results: PERIPHERAL VASCULAR CATHETERIZATION  Result Date:  02/25/2020 See op note  DG Foot Complete Left  Result Date: 02/25/2020 CLINICAL DATA:  Status post partial third ray amputation. EXAM: LEFT FOOT - COMPLETE 3+ VIEW COMPARISON:  Radiographs 02/23/2020 FINDINGS: There is a sharp osteotomy defect involving the midshaft region of the third metatarsal consistent with recent amputation. No complicating features are identified. The other bony structures are intact. Diffuse soft tissue swelling and scattered soft tissue calcifications. IMPRESSION: Status post partial third ray amputation. Electronically Signed   By: Marijo Sanes M.D.   On: 02/25/2020 15:39    Anti-infectives: Anti-infectives (From admission, onward)   Start     Dose/Rate Route Frequency Ordered Stop   02/25/20 2200  linezolid (ZYVOX) tablet 600 mg        600 mg Oral Every 12 hours 02/25/20 1739     02/24/20 1748  vancomycin (VANCOCIN) powder  Status:  Discontinued          As needed 02/24/20 1748 02/24/20 1825   02/23/20 2000  vancomycin (VANCOCIN) IVPB 1000 mg/200 mL premix  Status:  Discontinued        1,000 mg 200 mL/hr over 60 Minutes Intravenous Every 12 hours 02/23/20 1602 02/25/20 1739   02/23/20 1800  piperacillin-tazobactam (ZOSYN) IVPB 3.375 g        3.375 g 12.5 mL/hr over 240 Minutes Intravenous Every 8 hours 02/23/20 1600     02/23/20 1030  vancomycin (VANCOCIN) IVPB 1000 mg/200 mL  premix        1,000 mg 200 mL/hr over 60 Minutes Intravenous  Once 02/23/20 1025 02/23/20 1302   02/23/20 1030  piperacillin-tazobactam (ZOSYN) IVPB 3.375 g        3.375 g 100 mL/hr over 30 Minutes Intravenous  Once 02/23/20 1025 02/23/20 1159      Assessment/Plan: s/p Procedure(s): Lower Extremity Angiography (Left) Assessment: Condition guarded status post ray resection left foot.   Plan:  -Patient seen and examined. -No purulence expressible today.  Tissues still appeared to be demarcating at this time.  Mild serous drainage present.  Decreased erythema and edema with no  odor.  Appears to be slightly improved since previous visit. -Iodoform gauze packed within the dorsal aspect of the foot followed by a bulky gauze bandage. -Wound cultures pending still.  Appreciate IV antibiotic recommendations per infectious disease. -Appreciate vascular recommendations. -Discussed with patient the need for potential further debridement in the future after tissues have demarcated further.  We will continue to monitor the situation closely. -Patient to continue nonweightbearing at all times left lower extremity.  PT consultation ordered.    LOS: 3 days    Caroline More, [facility] 02/26/2020

## 2020-02-26 NOTE — Progress Notes (Signed)
Patient is insistent on taking Imdur medication d/t stent placement 3 weeks ago. He states that if he does not get it, he will have chest pain. RN communicated with provider on call E.Ouma, NP, via secure messaging. She reports this medication was discontinued by surgeon/cards prior to pts surgical procedure. Provider will communicate with surgeon or pt cardiologist to visit w/ pt regarding discontinuation vs restarting this home medication. Patient understands plan. In the meantime, pt is aware that if he experiences chest pain, NTG will be given for relief.

## 2020-02-27 LAB — GLUCOSE, CAPILLARY
Glucose-Capillary: 254 mg/dL — ABNORMAL HIGH (ref 70–99)
Glucose-Capillary: 207 mg/dL — ABNORMAL HIGH (ref 70–99)
Glucose-Capillary: 202 mg/dL — ABNORMAL HIGH (ref 70–99)
Glucose-Capillary: 199 mg/dL — ABNORMAL HIGH (ref 70–99)

## 2020-02-27 LAB — CBC
HCT: 38.1 % — ABNORMAL LOW (ref 39.0–52.0)
Hemoglobin: 12.3 g/dL — ABNORMAL LOW (ref 13.0–17.0)
MCH: 29.9 pg (ref 26.0–34.0)
MCHC: 32.3 g/dL (ref 30.0–36.0)
MCV: 92.5 fL (ref 80.0–100.0)
Platelets: 206 10*3/uL (ref 150–400)
RBC: 4.12 MIL/uL — ABNORMAL LOW (ref 4.22–5.81)
RDW: 13.2 % (ref 11.5–15.5)
WBC: 6.7 10*3/uL (ref 4.0–10.5)
nRBC: 0 % (ref 0.0–0.2)

## 2020-02-27 MED ORDER — INSULIN GLARGINE 100 UNIT/ML ~~LOC~~ SOLN
5.0000 [IU] | Freq: Once | SUBCUTANEOUS | Status: AC
  Administered 2020-02-27: 5 [IU] via SUBCUTANEOUS
  Filled 2020-02-27: qty 0.05

## 2020-02-27 MED ORDER — INSULIN GLARGINE 100 UNIT/ML ~~LOC~~ SOLN
40.0000 [IU] | Freq: Two times a day (BID) | SUBCUTANEOUS | Status: DC
  Administered 2020-02-27 – 2020-02-29 (×4): 40 [IU] via SUBCUTANEOUS
  Administered 2020-02-29: 20 [IU] via SUBCUTANEOUS
  Administered 2020-03-01 – 2020-03-03 (×5): 40 [IU] via SUBCUTANEOUS
  Filled 2020-02-27 (×12): qty 0.4

## 2020-02-27 MED ORDER — INSULIN ASPART 100 UNIT/ML ~~LOC~~ SOLN
15.0000 [IU] | Freq: Three times a day (TID) | SUBCUTANEOUS | Status: DC
  Administered 2020-02-27 – 2020-03-02 (×9): 15 [IU] via SUBCUTANEOUS
  Filled 2020-02-27 (×9): qty 1

## 2020-02-27 NOTE — Evaluation (Signed)
Physical Therapy Evaluation Patient Details Name: [name]. MRN: [id number] DOB: February 26, 1956 Today's Date: 02/27/2020   History of Present Illness  Pt is a 64 y/o M admitted on 02/23/20 for sepsis 2/2 L foot redness & swelling & pt found to have cellulitis. Pt underwent L partial 3rd ray amputation, I&D of L foot on 02/24/20 & now NWB LLE. Pt also underwent Lower Extremity Angiography (Left) on 02/25/20. PMH: PAD, CAD s/p PCI with drug eluting stent to mid RCA on 02/03/20, DM2, HTN, OSA, asthma, depression, gout, HLD, obesity, MI.    Clinical Impression  Pt received in bed & agreeable to tx but reporting need to use restroom & reporting he's been ambulating into bathroom. Pt begins ambulating with RW with weight bearing through L heel despite therapist educating him multiple times on NWB LLE with pt & visitor both reporting he was told by nursing staff he could weight bear. Pt has continent void while standing without maintaining weight bearing restrictions & returns to sitting on bed in same manner. PT thoroughly educates pt on NWB LLE per podiatry note and provides education & demo re: standing/gait with RW with NWB LLE & using BUE to offset weight to allow him to advance RLE vs solely hopping on RLE. Pt reports "I can't do that" on RLE but willing to attempt. Pt elects to significantly elevate EOB for sit>stand transfers, reporting he has modified all furniture at home for increased height, and pt with very poor eccentric control with stand>sit. Pt attempts ambulating with RW & NWB LLE with min assist x 6 ft by EOB but report this is a great effort & difficult for him, also reporting he will get a knee scooter to use at home. PT obtains knee scooter & adjusts It several times to be of appropriate height with pt ambulating with AD x 25 ft + 35 ft with min assist then CGA fade to close supervision with pt reporting increased comfort with use of AD although he continues to demonstrate difficulty  transferring to standing & LLE on AD while maintaining balance & NWB LLE. This PT has concerns re: pt negotiating several locations of stairs within his home but he can safely access his house via level side entry. Do not anticipate pt being willing to d/c to SNF so at this time recommending HHPT services to follow up with pt. Will continue to follow pt acutely to focus on gait training, safety awareness, & stair training as able.    Follow Up Recommendations Home health PT;Supervision for mobility/OOB    Equipment Recommendations   (knee scooter, w/c for community mobility)    Recommendations for Other Services       Precautions / Restrictions Precautions Precautions: Fall Restrictions Weight Bearing Restrictions: Yes LLE Weight Bearing: Non weight bearing      Mobility  Bed Mobility               General bed mobility comments: did not observe, pt received & left sitting EOB Patient Response: Impulsive  Transfers Overall transfer level: Needs assistance Equipment used: Rolling walker (2 wheeled) (knee scooter) Transfers: Sit to/from Stand Sit to Stand: Supervision            Ambulation/Gait Ambulation/Gait assistance: Supervision;Min guard Gait Distance (Feet): 35 Feet Assistive device:  (knee scooter)          Stairs            Wheelchair Mobility    Modified Rankin (Stroke Patients Only)  Balance Overall balance assessment: Needs assistance Sitting-balance support: Feet supported;Bilateral upper extremity supported Sitting balance-Leahy Scale: Normal     Standing balance support: Bilateral upper extremity supported;During functional activity Standing balance-Leahy Scale: Fair                               Pertinent Vitals/Pain Pain Assessment: 0-10 Pain Score: 3  Pain Location: LLE Pain Descriptors / Indicators:  (neuropathic pain) Pain Intervention(s): Limited activity within patient's tolerance;Monitored during  session    [place] expects to be discharged to:: Private residence Living Arrangements: Spouse/significant other Available Help at Discharge: Family Type of Home: House Home Access: Level entry (at side entrance)     Home Layout: Multi-level Home Equipment: Environmental consultant - 2 wheels      Prior Function Level of Independence: Independent               Hand Dominance        Extremity/Trunk Assessment   Upper Extremity Assessment Upper Extremity Assessment: Generalized weakness    Lower Extremity Assessment Lower Extremity Assessment: Generalized weakness       Communication   Communication: No difficulties  Cognition Arousal/Alertness: Awake/alert Behavior During Therapy: WFL for tasks assessed/performed;Impulsive Overall Cognitive Status: Within Functional Limits for tasks assessed                                        General Comments      Exercises     Assessment/Plan    PT Assessment Patient needs continued PT services  PT Problem List Decreased mobility;Decreased safety awareness;Decreased knowledge of precautions;Decreased activity tolerance;Decreased skin integrity;Obesity;Decreased balance;Decreased knowledge of use of DME;Impaired sensation;Pain       PT Treatment Interventions Wheelchair mobility training;DME instruction;Therapeutic exercise;Gait training;Balance training;Stair training;Neuromuscular re-education;Functional mobility training;Therapeutic activities;Patient/family education    PT Goals (Current goals can be found in the Care Plan section)  Acute Rehab PT Goals Patient Stated Goal: none stated PT Goal Formulation: With patient/family Time For Goal Achievement: 03/11/20 Potential to Achieve Goals: Fair    Frequency 7X/week   Barriers to discharge Inaccessible home environment;Decreased caregiver support multiple steps in home    Co-evaluation               AM-PAC PT "6 Clicks" Mobility   Outcome Measure Help needed turning from your back to your side while in a flat bed without using bedrails?: None Help needed moving from lying on your back to sitting on the side of a flat bed without using bedrails?: None Help needed moving to and from a bed to a chair (including a wheelchair)?: A Little Help needed standing up from a chair using your arms (e.g., wheelchair or bedside chair)?: A Little Help needed to walk in hospital room?: A Little Help needed climbing 3-5 steps with a railing? : Total 6 Click Score: 18    End of Session Equipment Utilized During Treatment: Gait belt Activity Tolerance: Patient tolerated treatment well Patient left: with bed alarm set;with family/visitor present (sitting EOB) Nurse Communication: Mobility status;Weight bearing status PT Visit Diagnosis: Unsteadiness on feet (R26.81);Other abnormalities of gait and mobility (R26.89);Difficulty in walking, not elsewhere classified (R26.2)    Time: 5993-5701 PT Time Calculation (min) (ACUTE ONLY): 33 min   Charges:   PT Evaluation $PT Eval Moderate Complexity: 1 Mod PT Treatments $Gait Training: 8-22 mins $  Therapeutic Activity: 8-22 mins        Lavone Nian, PT, DPT 02/27/20, 11:10 AM   [name] 02/27/2020, 11:05 AM

## 2020-02-27 NOTE — Progress Notes (Signed)
PROGRESS NOTE    [name].  HUD:[id number] DOB: 1955/12/06 DOA: 02/23/2020 PCP: [name], MD    Brief Narrative:   [name]. is a 64 y.o. male with medical history significant for peripheral artery disease on chronic Eliquis therapy, coronary artery disease status post PCI with drug-eluting stent to the mid RCA on 02/03/2020, type 2 diabetes mellitus, hypertension, obstructive sleep apnea compliant on home nocturnal CPAP, who is admitted to [facility] on 02/23/2020 with severe sepsis due to left lower extremity cellulitis after presenting from home to [facility] Emergency Department complaining of left lower extremity erythema.  The patient confirms that he underwent coronary angiography on 02/03/2020 which revealed 90% stenosis of the mid RCA which time he underwent PCI with drug-eluting stent placement to the mid RCA.  Per my review of the ensuing cardiology note, the patient is to remain on on dual antiplatelet therapy without interruption for the next 1 year.  Denies any ensuing chest pain, shortness of breath, palpitations, diaphoresis, dizziness, presyncope, or syncope  10/29-BG levels high in 300's. 10/30-no overnight issues. BG 200's.   Consultants:    podiatry, vascular surgery  Procedures:   Antimicrobials:   zosyn  Vancomycin  Subjective: Wife at bedside.  Patient denies any shortness of breath, fever, chills or any other symptoms.  Apparently last night when she he got Dilaudid he was acting little loopy and laughing.   Objective: Vitals:   02/26/20 1547 02/26/20 2149 02/27/20 0010 02/27/20 0500  BP: 140/75 125/77 116/60   Pulse: 76 72 73   Resp: 17  18   Temp: 98.4 F (36.9 C) 98 F (36.7 C) 98.1 F (36.7 C)   TempSrc: Oral Oral Oral   SpO2: 99% 98% 98%   Weight:    (!) 141.3 kg  Height:        Intake/Output Summary (Last 24 hours) at 02/27/2020 0759 Last data filed at 02/27/2020 0209 Gross per  24 hour  Intake --  Output 1275 ml  Net -1275 ml   Filed Weights   02/25/20 1108 02/26/20 0508 02/27/20 0500  Weight: (!) 139.9 kg (!) 141.2 kg (!) 141.3 kg    Examination: Sitting up in bed, smiling, comfortable CTA, no wheeze rales rhonchi's Regular S1-S2 no murmurs or gallops Soft benign positive bowel sounds Bilateral edema, left lower extremity erythema and warmth has improved.  Erythema is up to mid shin now. , dressing in place Alert oriented x3 Affect and mood appropriate in current setting   Data Reviewed: I have personally reviewed following labs and imaging studies  CBC: Recent Labs  Lab 02/23/20 1058 02/24/20 0429 02/25/20 0455 02/27/20 0413  WBC 16.3* 14.3* 11.1* 6.7  NEUTROABS 14.1* 12.0*  --   --   HGB 12.8* 11.4* 11.5* 12.3*  HCT 38.9* 34.0* 35.1* 38.1*  MCV 92.6 91.9 92.9 92.5  PLT 161 155 146* 858   Basic Metabolic Panel: Recent Labs  Lab 02/23/20 1058 02/24/20 0429 02/25/20 0455 02/26/20 0427  NA 131* 130* 133* 134*  K 4.6 4.1 4.3 4.5  CL 96* 98 100 102  CO2 25 24 26 23   GLUCOSE 406* 387* 407* 344*  BUN 31* 33* 26* 22  CREATININE 1.34* 1.32* 1.06 0.97  CALCIUM 8.4* 7.8* 7.7* 7.8*  MG 1.8 1.8  --   --    GFR: Estimated Creatinine Clearance: 116.6 mL/min (by C-G formula based on SCr of 0.97 mg/dL). Liver Function Tests: Recent Labs  Lab  02/23/20 1058 02/24/20 0429  AST 17 15  ALT 19 16  ALKPHOS 83 75  BILITOT 0.9 0.7  PROT 6.7 5.8*  ALBUMIN 3.1* 2.6*   No results for input(s): LIPASE, AMYLASE in the last 168 hours. No results for input(s): AMMONIA in the last 168 hours. Coagulation Profile: Recent Labs  Lab 02/24/20 0429  INR 1.3*   Cardiac Enzymes: No results for input(s): CKTOTAL, CKMB, CKMBINDEX, TROPONINI in the last 168 hours. BNP (last 3 results) No results for input(s): PROBNP in the last 8760 hours. HbA1C: No results for input(s): HGBA1C in the last 72 hours. CBG: Recent Labs  Lab 02/25/20 2141 02/26/20 0813  02/26/20 1138 02/26/20 1640 02/26/20 2128  GLUCAP 332* 265* 288* 302* 258*   Lipid Profile: No results for input(s): CHOL, HDL, LDLCALC, TRIG, CHOLHDL, LDLDIRECT in the last 72 hours. Thyroid Function Tests: No results for input(s): TSH, T4TOTAL, FREET4, T3FREE, THYROIDAB in the last 72 hours. Anemia Panel: No results for input(s): VITAMINB12, FOLATE, FERRITIN, TIBC, IRON, RETICCTPCT in the last 72 hours. Sepsis Labs: Recent Labs  Lab 02/23/20 1058 02/23/20 1454  LATICACIDVEN 2.1* 1.5    Recent Results (from the past 240 hour(s))  Culture, blood (routine x 2)     Status: None (Preliminary result)   Collection Time: 02/23/20 10:58 AM   Specimen: BLOOD  Result Value Ref Range Status   Specimen Description BLOOD BLOOD RIGHT FOREARM  Final   Special Requests   Final    BOTTLES DRAWN AEROBIC AND ANAEROBIC Blood Culture adequate volume   Culture   Final    NO GROWTH 4 DAYS Performed at [facility], [address], [place], [place] [address]    Report Status PENDING  Incomplete  Culture, blood (routine x 2)     Status: None (Preliminary result)   Collection Time: 02/23/20 10:58 AM   Specimen: BLOOD  Result Value Ref Range Status   Specimen Description BLOOD Blood Culture adequate volume  Final   Special Requests   Final    BOTTLES DRAWN AEROBIC AND ANAEROBIC LEFT ANTECUBITAL   Culture   Final    NO GROWTH 4 DAYS Performed at [facility], [address], [place], [place] [address]    Report Status PENDING  Incomplete  Respiratory Panel by RT PCR (Flu A&B, Covid) - Nasopharyngeal Swab     Status: None   Collection Time: 02/23/20 11:59 AM   Specimen: Nasopharyngeal Swab  Result Value Ref Range Status   SARS Coronavirus 2 by RT PCR NEGATIVE NEGATIVE Final    Comment: (NOTE) SARS-CoV-2 target nucleic acids are NOT DETECTED.  The SARS-CoV-2 RNA is generally detectable in upper respiratoy specimens during the acute phase of infection. The  lowest concentration of SARS-CoV-2 viral copies this assay can detect is 131 copies/mL. A negative result does not preclude SARS-Cov-2 infection and should not be used as the sole basis for treatment or other patient management decisions. A negative result may occur with  improper specimen collection/handling, submission of specimen other than nasopharyngeal swab, presence of viral mutation(s) within the areas targeted by this assay, and inadequate number of viral copies (<131 copies/mL). A negative result must be combined with clinical observations, patient history, and epidemiological information. The expected result is Negative.  Fact Sheet for Patients:  PinkCheek.be  Fact Sheet for Healthcare Providers:  GravelBags.it  This test is no t yet approved or cleared by the [place] FDA and  has been authorized for detection and/or diagnosis of SARS-CoV-2  by FDA under an Emergency Use Authorization (EUA). This EUA will remain  in effect (meaning this test can be used) for the duration of the COVID-19 declaration under Section 564(b)(1) of the Act, 21 U.S.C. section 360bbb-3(b)(1), unless the authorization is terminated or revoked sooner.     Influenza A by PCR NEGATIVE NEGATIVE Final   Influenza B by PCR NEGATIVE NEGATIVE Final    Comment: (NOTE) The Xpert Xpress SARS-CoV-2/FLU/RSV assay is intended as an aid in  the diagnosis of influenza from Nasopharyngeal swab specimens and  should not be used as a sole basis for treatment. Nasal washings and  aspirates are unacceptable for Xpert Xpress SARS-CoV-2/FLU/RSV  testing.  Fact Sheet for Patients: PinkCheek.be  Fact Sheet for Healthcare Providers: GravelBags.it  This test is not yet approved or cleared by the [place] FDA and  has been authorized for detection and/or diagnosis of SARS-CoV-2 by  FDA under  an Emergency Use Authorization (EUA). This EUA will remain  in effect (meaning this test can be used) for the duration of the  Covid-19 declaration under Section 564(b)(1) of the Act, 21  U.S.C. section 360bbb-3(b)(1), unless the authorization is  terminated or revoked. Performed at [facility], [address] [place], [place] [address]   Surgical pcr screen     Status: None   Collection Time: 02/24/20 12:25 PM   Specimen: Nasal Mucosa; Nasal Swab  Result Value Ref Range Status   MRSA, PCR NEGATIVE NEGATIVE Final   Staphylococcus aureus NEGATIVE NEGATIVE Final    Comment: (NOTE) The Xpert SA Assay (FDA approved for NASAL specimens in patients 25 years of age and older), is one component of a comprehensive surveillance program. It is not intended to diagnose infection nor to guide or monitor treatment. Performed at [facility], [address] [place], [place] [address]   Aerobic/Anaerobic Culture (surgical/deep wound)     Status: None (Preliminary result)   Collection Time: 02/24/20  5:44 PM   Specimen: PATH Other; Body Fluid  Result Value Ref Range Status   Specimen Description   Final    WOUND 3RD RAY LEFT FOOT SAMPLE A Performed at [facility], [address], [place], [place] [address]    Special Requests   Final    NONE Performed at [facility], [place], [place] [address]    Gram Stain   Final    NO WBC SEEN ABUNDANT GRAM POSITIVE COCCI FEW GRAM NEGATIVE DIPLOCOCCI    Culture   Final    MODERATE GROUP B STREP(S.AGALACTIAE)ISOLATED TESTING AGAINST S. AGALACTIAE NOT ROUTINELY PERFORMED DUE TO PREDICTABILITY OF AMP/PEN/VAN SUSCEPTIBILITY. Performed at [facility], [place] [address], [place], [place] [address]    Report Status PENDING  Incomplete         Radiology Studies: PERIPHERAL VASCULAR CATHETERIZATION  Result Date: 02/25/2020 See op note  DG Foot Complete Left  Result Date:  02/25/2020 CLINICAL DATA:  Status post partial third ray amputation. EXAM: LEFT FOOT - COMPLETE 3+ VIEW COMPARISON:  Radiographs 02/23/2020 FINDINGS: There is a sharp osteotomy defect involving the midshaft region of the third metatarsal consistent with recent amputation. No complicating features are identified. The other bony structures are intact. Diffuse soft tissue swelling and scattered soft tissue calcifications. IMPRESSION: Status post partial third ray amputation. Electronically Signed   By: Marijo Sanes M.D.   On: 02/25/2020 15:39        Scheduled Meds: . acidophilus  1 capsule Oral  Daily  . apixaban  5 mg Oral BID  . aspirin EC  81 mg Oral Daily  . atorvastatin  40 mg Oral QHS  . clopidogrel  75 mg Oral Q breakfast  . insulin aspart  0-15 Units Subcutaneous TID WC  . insulin aspart  10 Units Subcutaneous TID WC  . insulin glargine  35 Units Subcutaneous BID  . linezolid  600 mg Oral Q12H  . metoprolol tartrate  50 mg Oral BID  . pramipexole  1 mg Oral QHS  . primidone  250 mg Oral BID  . tamsulosin  0.4 mg Oral Daily   Continuous Infusions: . piperacillin-tazobactam (ZOSYN)  IV 3.375 g (02/27/20 0209)    Assessment & Plan:   Principal Problem:   Cellulitis of left lower extremity Active Problems:   Diabetes mellitus type 2 in obese (HCC)   PAD (peripheral artery disease) (HCC)   CAD (coronary artery disease)   Severe sepsis (HCC)   AKI (acute kidney injury) ([place])   #)  sepsis due to cellulitis/left third MTPJ ulceration with gas gangrene:  plain films and MRI showing evidence of subcutaneous swelling consistent with cellulitis in the absence of any evidence of acute osteomyelitis.   Status post left partial third ray amputation by Dr. Luana Shu on 10/27 10/29-status post angiogram by Dr. Lucky Cowboy on 10/28.  Status post angioplasty of the left posterior tibial artery with drug-coated angioplasty balloon, angioplasty of the left popliteal artery and proximal tibioperoneal  trunk also 10/30-leukocytosis has improved.  Lactic acid levels improved.` Per podiatry patient likely will need to go back to the OR for further debridement and washout and potential wound VAC application. Patient will be placed on n.p.o. at 8 AM on 02/29/2020 for surgical procedure at some point in the afternoon/evening Patient should be nonweightbearing for the most part of all times to the left lower extremity but can use heel contact and surgical shoe when performing transfers. PT consult Patient should try to stay off foot as much as possible at this time Wound cultures growing strep agalactiae and staph aureurs, sensitivities pending.  ID following-continue with linezolid and Zosyn until able to switch to p.o. medication based on sensitivity     #) Acute kidney injury: Presenting creatinine noted to be 1.34 relative to most recent prior value of 0.823 when checked on 02/03/2020.   Likely due to intravascular depletion/severe sepsis.  Likely exacerbated with losartan. ARB on hold. Creatinine improved with hydration.  Remained stable. Monitor periodically   #) Obstructive sleep apnea: On CPAP at home reports compliance     #) Type 2 diabetes mellitus with hyperglycemia-uncontrolled Hemoglobin A1c 9.7 Discussed with patient importance of controlling his diabetes as outpatient 10/30-blood glucose level slowly improving.   Likely infection contributing to elevated blood sugars 2.   Will increase Lantus to 40 units twice daily, 10/29-bg levels elevated. Likely infection attributing to this too Increase Lantus to 35units bid Increase NovoLog with meals 3 times daily to 15 units Continue R-ISS   #) Peripheral artery disease/CAD: Patient had coronary cath on 02/03/2020 status post DES to mid RCA S/p angiogram on 10/28 Status post angiogram on 02/25/2020-please see #1.  Status post angioplasty. Continue with Plavix and Eliquis, per cardiology without interruption for 1  year Continue with beta-blockers and statin As blood pressure is stable and he is also going back to the OR may be hypotensive post procedure   #) BPH: On Flomax      DVT prophylaxis: Eliquis Code Status: Full  Family Communication: Wife at bedside  Status is: Inpatient  Remains inpatient appropriate because:IV treatments appropriate due to intensity of illness or inability to take PO   Dispo: The patient is from: Home              Anticipated d/c is to: Home              Anticipated d/c date is: 3 days              Patient currently is not medically stable to d/c.  Will be going to the OR on Monday, need to continue IV antibiotics, follow-up culture sensitivities            LOS: 4 days   Time spent: 35 min with >50% on coc   Nolberto Hanlon, MD [facility] Pager [phone]  If 7PM-7AM, please contact night-coverage [email or web address] Password [email or web address] 02/27/2020, 7:59 AM

## 2020-02-27 NOTE — Progress Notes (Signed)
2 Days Post-Op   Subjective/Chief Complaint: Patient seen.  States he is feeling okay and some better than yesterday.  Patient has minimal pain to his foot overall at this time and is feeling better in general.  Patient is trying to maintain nonweightbearing on the foot since procedure. Patient worked with physical therapy this morning.  Objective: Vital signs in last 24 hours: Temp:  [98 F (36.7 C)-98.4 F (36.9 C)] 98.3 F (36.8 C) (10/30 0800) Pulse Rate:  [72-76] 75 (10/30 0800) Resp:  [16-18] 16 (10/30 0800) BP: (116-140)/(60-80) 130/80 (10/30 0800) SpO2:  [96 %-99 %] 96 % (10/30 0800) Weight:  [141.3 kg] 141.3 kg (10/30 0500) Last BM Date: 02/25/20  Intake/Output from previous day: 10/29 0701 - 10/30 0700 In: -  Out: 5176 [Urine:1275] Intake/Output this shift: Total I/O In: 593.4 [P.O.:240; IV Piggyback:353.4] Out: -   Capillary fill time appears to be intact to digits on the left foot.  Appears to still have some dusky changes present around mainly the dorsal incision site with some maceration of tissues present.  Appears to be slightly improved since previous visit.  No purulent drainage noted today.  Incision still appears to be well coapted with sutures intact with the exception of the dorsal aspect of the foot which appears to have surgical dehiscence already with some appearing nonviable tissue around the area of the incision line. Erythema and edema though do appear to be improving all since admission and since procedure. No odor present. Some mild discoloration to the plantar incision line with some new areas of potential necrosis.        Lab Results:  Recent Labs    02/25/20 0455 02/27/20 0413  WBC 11.1* 6.7  HGB 11.5* 12.3*  HCT 35.1* 38.1*  PLT 146* 206   BMET Recent Labs    02/25/20 0455 02/26/20 0427  NA 133* 134*  K 4.3 4.5  CL 100 102  CO2 26 23  GLUCOSE 407* 344*  BUN 26* 22  CREATININE 1.06 0.97  CALCIUM 7.7* 7.8*   PT/INR No results  for input(s): LABPROT, INR in the last 72 hours. ABG No results for input(s): PHART, HCO3 in the last 72 hours.  Invalid input(s): PCO2, PO2  Studies/Results: DG Foot Complete Left  Result Date: 02/25/2020 CLINICAL DATA:  Status post partial third ray amputation. EXAM: LEFT FOOT - COMPLETE 3+ VIEW COMPARISON:  Radiographs 02/23/2020 FINDINGS: There is a sharp osteotomy defect involving the midshaft region of the third metatarsal consistent with recent amputation. No complicating features are identified. The other bony structures are intact. Diffuse soft tissue swelling and scattered soft tissue calcifications. IMPRESSION: Status post partial third ray amputation. Electronically Signed   By: Marijo Sanes M.D.   On: 02/25/2020 15:39    Anti-infectives: Anti-infectives (From admission, onward)   Start     Dose/Rate Route Frequency Ordered Stop   02/25/20 2200  linezolid (ZYVOX) tablet 600 mg        600 mg Oral Every 12 hours 02/25/20 1739     02/24/20 1748  vancomycin (VANCOCIN) powder  Status:  Discontinued          As needed 02/24/20 1748 02/24/20 1825   02/23/20 2000  vancomycin (VANCOCIN) IVPB 1000 mg/200 mL premix  Status:  Discontinued        1,000 mg 200 mL/hr over 60 Minutes Intravenous Every 12 hours 02/23/20 1602 02/25/20 1739   02/23/20 1800  piperacillin-tazobactam (ZOSYN) IVPB 3.375 g  3.375 g 12.5 mL/hr over 240 Minutes Intravenous Every 8 hours 02/23/20 1600     02/23/20 1030  vancomycin (VANCOCIN) IVPB 1000 mg/200 mL premix        1,000 mg 200 mL/hr over 60 Minutes Intravenous  Once 02/23/20 1025 02/23/20 1302   02/23/20 1030  piperacillin-tazobactam (ZOSYN) IVPB 3.375 g        3.375 g 100 mL/hr over 30 Minutes Intravenous  Once 02/23/20 1025 02/23/20 1159      Assessment/Plan: s/p Procedure(s): Lower Extremity Angiography (Left) Assessment: Condition guarded status post ray resection left foot.   Plan:  -Patient seen and examined. -No purulence  expressible today.  Tissues still appeared to be demarcating at this time.  Mild serous drainage present.  Decreased erythema and edema with no odor. Tissues appear to be demarcating at this time and appears to have some mild incisional necrosis. -Most of the dorsal sutures removed today and iodoform packing gauze was once again placed in the dorsal aspect of the incision line followed by bulky gauze dressing. -Orders placed for dressing changes over the next 2 days. -Discussed with patient that he needs to go back to the operating room for further debridement and washout and potential wound VAC application. Discussed all treatment options with the patient both conservative and surgical attempts at correction at this time is elected for surgical procedure consisting of incision and drainage with debridement of all nonviable necrotic tissues to the left foot with application of wound VAC and potential antibiotic bead placement. Patient agreeable to procedure once benefits and complications were discussed. -Patient will be placed n.p.o. at 8 AM on 02/29/2020 for surgical procedure at some point in the late afternoon/early evening. -Patient should be nonweightbearing for the most part at all times to the left lower extremity but can use heel contact and surgical shoe when performing transfers. Appreciate PT recommendations. Patient should try to stay off of the foot is much as possible at this time. -Wound cultures growing strep agalactiae and staph aureus, sensitivities to follow. Appreciate IV antibiotic recommendations per infectious disease. Could potentially switch to oral antibiotics if sensitivities come back favorable. Defer to infectious disease for further therapy. -Appreciate vascular recommendations.    LOS: 4 days    Caroline More, DPM 02/27/2020

## 2020-02-28 LAB — URINALYSIS, COMPLETE (UACMP) WITH MICROSCOPIC
Bacteria, UA: NONE SEEN
Bilirubin Urine: NEGATIVE
Glucose, UA: NEGATIVE mg/dL
Hgb urine dipstick: NEGATIVE
Ketones, ur: NEGATIVE mg/dL
Leukocytes,Ua: NEGATIVE
Nitrite: NEGATIVE
Protein, ur: 30 mg/dL — AB
Specific Gravity, Urine: 1.009 (ref 1.005–1.030)
Squamous Epithelial / HPF: NONE SEEN (ref 0–5)
pH: 5 (ref 5.0–8.0)

## 2020-02-28 LAB — CULTURE, BLOOD (ROUTINE X 2)
Culture: NO GROWTH
Culture: NO GROWTH
Special Requests: ADEQUATE
Specimen Description: ADEQUATE

## 2020-02-28 LAB — GLUCOSE, CAPILLARY
Glucose-Capillary: 181 mg/dL — ABNORMAL HIGH (ref 70–99)
Glucose-Capillary: 192 mg/dL — ABNORMAL HIGH (ref 70–99)
Glucose-Capillary: 160 mg/dL — ABNORMAL HIGH (ref 70–99)
Glucose-Capillary: 150 mg/dL — ABNORMAL HIGH (ref 70–99)

## 2020-02-28 LAB — SURGICAL PCR SCREEN
MRSA, PCR: NEGATIVE
Staphylococcus aureus: NEGATIVE

## 2020-02-28 LAB — CREATININE, URINE, RANDOM: Creatinine, Urine: 45 mg/dL

## 2020-02-28 LAB — SODIUM, URINE, RANDOM: Sodium, Ur: 66 mmol/L

## 2020-02-28 MED ORDER — CHLORHEXIDINE GLUCONATE 4 % EX LIQD: 60.0000 mL | Freq: Once | CUTANEOUS | Status: DC

## 2020-02-28 MED ORDER — ENSURE PRE-SURGERY PO LIQD
296.0000 mL | Freq: Once | ORAL | Status: DC
  Filled 2020-02-28: qty 296

## 2020-02-28 NOTE — Progress Notes (Addendum)
PT Cancellation Note  Patient Details Name: [name]. MRN: [id number] DOB: 1956/02/22   Cancelled Treatment:     Pt received in bed, initially agreeable to tx with PT providing pt with knee scooter but pt reports "I'm okay with that", opting instead for exercises. PT provides pt with supine HEP but pt then declines performing exercises at this time, reporting he just woke up. Pt's family member in room but pt reports he has not received post op shoe (nurse notified & ordered one). PT educates pt on weight bearing restrictions (weight bearing through L heel with post op shoe for transfers only, otherwise NWB) with pt & family member reporting MD informed pt he can weight bear through heel for ambulation into bathroom (nurse messaged MD who later confirms this). Pt declines participation in PT at this time.  Lavone Nian, PT, DPT 02/28/20, 12:29 PM   [name] 02/28/2020, 12:26 PM

## 2020-02-28 NOTE — Progress Notes (Signed)
[facility] [place] NAME: [name]    MR#:  [id number]  DATE OF BIRTH:  07-20-1955  SUBJECTIVE:  patient was not too keen on working with physical therapy much today. Wife at bedside. Frustrated with high sugars and recent ongoing social stressors at home.  REVIEW OF SYSTEMS:   Review of Systems  Constitutional: Negative for chills, fever and weight loss.  HENT: Negative for ear discharge, ear pain and nosebleeds.   Eyes: Negative for blurred vision, pain and discharge.  Respiratory: Negative for sputum production, shortness of breath, wheezing and stridor.   Cardiovascular: Negative for chest pain, palpitations, orthopnea and PND.  Gastrointestinal: Negative for abdominal pain, diarrhea, nausea and vomiting.  Genitourinary: Negative for frequency and urgency.  Musculoskeletal: Positive for joint pain. Negative for back pain.  Neurological: Negative for sensory change, speech change, focal weakness and weakness.  Psychiatric/Behavioral: Negative for depression and hallucinations. The patient is not nervous/anxious.    Tolerating Diet:yes Tolerating PT:   DRUG ALLERGIES:  No Known Allergies  VITALS:  Blood pressure 135/81, pulse 70, temperature 98.4 F (36.9 C), temperature source Oral, resp. rate 16, height 6\' 4"  (1.93 m), weight (!) 142.9 kg, SpO2 97 %.  PHYSICAL EXAMINATION:   Physical Exam  GENERAL:  64 y.o.-year-old patient lying in the bed with no acute distress. obese HEENT: Head atraumatic, normocephalic. Oropharynx and nasopharynx clear.  NECK:  Supple, no jugular venous distention. No thyroid enlargement, no tenderness.  LUNGS: Normal breath sounds bilaterally, no wheezing, rales, rhonchi. No use of accessory muscles of respiration.  CARDIOVASCULAR: S1, S2 normal. No murmurs, rubs, or gallops.  ABDOMEN: Soft, nontender, nondistended. Bowel sounds present. No organomegaly or mass.  EXTREMITIES: No cyanosis,  clubbing or edema b/l.   Left LE wrapped with dressing NEUROLOGIC: Cranial nerves II through XII are intact. No focal Motor or sensory deficits b/l.   PSYCHIATRIC:  patient is alert and oriented x 3.  SKIN: No obvious rash  LABORATORY PANEL:  CBC Recent Labs  Lab 02/27/20 0413  WBC 6.7  HGB 12.3*  HCT 38.1*  PLT 206    Chemistries  Recent Labs  Lab 02/24/20 0429 02/25/20 0455 02/26/20 0427  NA 130*   < > 134*  K 4.1   < > 4.5  CL 98   < > 102  CO2 24   < > 23  GLUCOSE 387*   < > 344*  BUN 33*   < > 22  CREATININE 1.32*   < > 0.97  CALCIUM 7.8*   < > 7.8*  MG 1.8  --   --   AST 15  --   --   ALT 16  --   --   ALKPHOS 75  --   --   BILITOT 0.7  --   --    < > = values in this interval not displayed.   Cardiac Enzymes No results for input(s): TROPONINI in the last 168 hours. RADIOLOGY:  No results found. ASSESSMENT AND PLAN:  Alec Woodis a 64 y.o.malewith medical history significant forperipheral artery disease on chronic Eliquis therapy, coronary artery disease status post PCI with drug-eluting stent to the mid RCA on 02/03/2020, type 2 diabetes mellitus, hypertension, obstructive sleep apnea compliant on home nocturnal CPAP,who is admitted on 10/26/2021with severe sepsis due to left lower extremity cellulitisafter presenting from home to  Emergency Department complaining of left lower extremity erythema.  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 --Status post left partial third ray amputation by Dr. Luana Shu on 10/27 --10/29-status post angiogram by Dr. Lucky Cowboy on 10/28.  Status post angioplasty of the left posterior tibial artery with drug-coated angioplasty balloon, angioplasty of the left popliteal artery and proximal tibioperoneal trunk also --10/30--Per podiatry patient likely will need to  go back to the OR for further debridement and washout and potential wound VAC application on Nov 1st --Patient should be nonweightbearing for the most part of all times to the left lower extremity but can use heel contact and surgical shoe when performing transfers. --PT consult appreciated --Wound cultures growing strep agalactiae and staph aureurs, sensitivities pending.  ID following-continue with linezolid and Zosyn until able to switch to p.o. medication based on sensitivity  # Acute kidney injury: Presenting creatinine noted to be 1.34 relative to most recent prior value of 0.823 when checked on 02/03/2020.  -Likely due to intravascular depletion/severe sepsis.  Likely exacerbated with losartan. ARB on hold. -Creatinine improved with hydration.  Remained stable.  # Obstructive sleep apnea: On CPAP at home reports compliance  # Type 2 diabetes mellitus with hyperglycemia-uncontrolled with PAD --Hemoglobin A1c 9.7 --Discussed with patient importance of controlling his diabetes as outpatient  -Likely infection contributing to elevated blood sugars 2.   -currently on Lantus  40 units twice daily with NovoLog with meals 3 times daily to 15 units --Continue R-ISS  # Peripheral artery disease/CAD: -Patient had coronary cath on 02/03/2020 status post DES to mid RCA S/p angiogram on 10/28 Status post angiogram on 02/25/2020-please see #1.  Status post angioplasty. -continue with Plavix and Eliquis, per cardiology without interruption for 1 year --on beta-blockers and statin  # BPH: On Flomax      DVT prophylaxis: Eliquis Code Status: Full Family Communication: Wife at bedside  Status is: Inpatient  Remains inpatient appropriate because:IV treatments appropriate due to intensity of illness or inability to take PO   Dispo: The patient is from: Home  Anticipated d/c is to: Home  Anticipated d/c date is:2- 3 days  Patient  currently is not medically stable to d/c.  Will be going to the OR on Monday, need to continue IV antibiotics, follow-up culture sensitivities      TOTAL TIME TAKING CARE OF THIS PATIENT: *25* minutes.  >50% time spent on counselling and coordination of care  Note: This dictation was prepared with Dragon dictation along with smaller phrase technology. Any transcriptional errors that result from this process are unintentional.  Fritzi Mandes M.D    [facility]   CC: Primary care physician; [name], MDPatient ID: [name]., male   DOB: March 28, 1956, 64 y.o.   MRN: [id number]

## 2020-02-28 NOTE — Plan of Care (Signed)

## 2020-02-29 ENCOUNTER — Encounter: Payer: Self-pay | Admitting: Internal Medicine

## 2020-02-29 ENCOUNTER — Encounter
Admission: EM | Disposition: A | Discharge status: Home Health | Payer: Self-pay | Source: Home / Self Care | Attending: Internal Medicine

## 2020-02-29 ENCOUNTER — Inpatient Hospital Stay: Payer: PPO | Admitting: Registered Nurse

## 2020-02-29 HISTORY — PX: IRRIGATION AND DEBRIDEMENT FOOT: SHX6602

## 2020-02-29 HISTORY — PX: APPLICATION OF WOUND VAC: SHX5189

## 2020-02-29 LAB — SURGICAL PATHOLOGY

## 2020-02-29 LAB — GLUCOSE, CAPILLARY
Glucose-Capillary: 189 mg/dL — ABNORMAL HIGH (ref 70–99)
Glucose-Capillary: 143 mg/dL — ABNORMAL HIGH (ref 70–99)
Glucose-Capillary: 126 mg/dL — ABNORMAL HIGH (ref 70–99)
Glucose-Capillary: 189 mg/dL — ABNORMAL HIGH (ref 70–99)

## 2020-02-29 SURGERY — IRRIGATION AND DEBRIDEMENT FOOT
Anesthesia: General | Site: Foot | Laterality: Left

## 2020-02-29 MED ORDER — PROPOFOL 500 MG/50ML IV EMUL
INTRAVENOUS | Status: DC | PRN
  Administered 2020-02-29: 150 ug/kg/min via INTRAVENOUS

## 2020-02-29 MED ORDER — SODIUM CHLORIDE 0.9 % IV SOLN: INTRAVENOUS | Status: DC | PRN

## 2020-02-29 MED ORDER — FENTANYL CITRATE (PF) 100 MCG/2ML IJ SOLN
INTRAMUSCULAR | Status: AC
  Filled 2020-02-29: qty 2

## 2020-02-29 MED ORDER — FENTANYL CITRATE (PF) 100 MCG/2ML IJ SOLN: 25.0000 ug | INTRAMUSCULAR | Status: DC | PRN

## 2020-02-29 MED ORDER — PROPOFOL 10 MG/ML IV BOLUS
INTRAVENOUS | Status: AC
  Filled 2020-02-29: qty 20

## 2020-02-29 MED ORDER — MORPHINE SULFATE (PF) 2 MG/ML IV SOLN
1.0000 mg | Freq: Once | INTRAVENOUS | Status: AC
  Administered 2020-03-02: 1 mg via INTRAVENOUS
  Filled 2020-02-29: qty 1

## 2020-02-29 MED ORDER — EPHEDRINE SULFATE 50 MG/ML IJ SOLN
INTRAMUSCULAR | Status: DC | PRN
  Administered 2020-02-29 (×4): 10 mg via INTRAVENOUS

## 2020-02-29 MED ORDER — OXYCODONE HCL 5 MG/5ML PO SOLN: 5.0000 mg | Freq: Once | ORAL | Status: DC | PRN

## 2020-02-29 MED ORDER — LIDOCAINE HCL (PF) 1 % IJ SOLN
INTRAMUSCULAR | Status: AC
  Filled 2020-02-29: qty 30

## 2020-02-29 MED ORDER — BUPIVACAINE HCL (PF) 0.5 % IJ SOLN
INTRAMUSCULAR | Status: AC
  Filled 2020-02-29: qty 30

## 2020-02-29 MED ORDER — MIDAZOLAM HCL 2 MG/2ML IJ SOLN
INTRAMUSCULAR | Status: AC
  Filled 2020-02-29: qty 2

## 2020-02-29 MED ORDER — PROPOFOL 500 MG/50ML IV EMUL
INTRAVENOUS | Status: AC
  Filled 2020-02-29: qty 50

## 2020-02-29 MED ORDER — FENTANYL CITRATE (PF) 100 MCG/2ML IJ SOLN
INTRAMUSCULAR | Status: DC | PRN
  Administered 2020-02-29 (×4): 25 ug via INTRAVENOUS

## 2020-02-29 MED ORDER — PROPOFOL 10 MG/ML IV BOLUS
INTRAVENOUS | Status: DC | PRN
  Administered 2020-02-29: 70 mg via INTRAVENOUS

## 2020-02-29 MED ORDER — KETAMINE HCL 50 MG/ML IJ SOLN
INTRAMUSCULAR | Status: DC | PRN
  Administered 2020-02-29: 25 mg via INTRAMUSCULAR

## 2020-02-29 MED ORDER — MIDAZOLAM HCL 2 MG/2ML IJ SOLN
INTRAMUSCULAR | Status: DC | PRN
  Administered 2020-02-29: 2 mg via INTRAVENOUS

## 2020-02-29 MED ORDER — LIDOCAINE HCL (PF) 1 % IJ SOLN
INTRAMUSCULAR | Status: DC | PRN
  Administered 2020-02-29: 10 mL

## 2020-02-29 MED ORDER — PIPERACILLIN-TAZOBACTAM 3.375 G IVPB
INTRAVENOUS | Status: AC
  Filled 2020-02-29: qty 50

## 2020-02-29 MED ORDER — EPHEDRINE 5 MG/ML INJ
INTRAVENOUS | Status: AC
  Filled 2020-02-29: qty 10

## 2020-02-29 MED ORDER — OXYCODONE HCL 5 MG PO TABS: 5.0000 mg | ORAL_TABLET | Freq: Once | ORAL | Status: DC | PRN

## 2020-02-29 SURGICAL SUPPLY — 58 items
BAG COUNTER SPONGE EZ (MISCELLANEOUS) ×3 IMPLANT
BLADE OSC/SAGITTAL MD 5.5X18 (BLADE) IMPLANT
BLADE OSCILLATING/SAGITTAL (BLADE)
BLADE SW THK.38XMED LNG THN (BLADE) IMPLANT
BNDG CONFORM 2 STRL LF (GAUZE/BANDAGES/DRESSINGS) IMPLANT
BNDG CONFORM 3 STRL LF (GAUZE/BANDAGES/DRESSINGS) IMPLANT
BNDG ELASTIC 3X5.8 VLCR NS LF (GAUZE/BANDAGES/DRESSINGS) IMPLANT
BNDG ELASTIC 4X5.8 VLCR NS LF (GAUZE/BANDAGES/DRESSINGS) IMPLANT
BNDG ESMARK 4X12 TAN STRL LF (GAUZE/BANDAGES/DRESSINGS) IMPLANT
BNDG GAUZE 4.5X4.1 6PLY STRL (MISCELLANEOUS) ×4 IMPLANT
CANISTER SUCT 1200ML W/VALVE (MISCELLANEOUS) IMPLANT
CANISTER SUCT 3000ML PPV (MISCELLANEOUS) IMPLANT
COUNTER SPONGE BAG EZ (MISCELLANEOUS) ×1
COVER WAND RF STERILE (DRAPES) ×4 IMPLANT
CUFF TOURN SGL QUICK 12 (TOURNIQUET CUFF) IMPLANT
CUFF TOURN SGL QUICK 18X4 (TOURNIQUET CUFF) ×4 IMPLANT
DRAPE FLUOR MINI C-ARM 54X84 (DRAPES) IMPLANT
DRSG VAC ATS MED SENSATRAC (GAUZE/BANDAGES/DRESSINGS) ×4 IMPLANT
DURAPREP 26ML APPLICATOR (WOUND CARE) ×4 IMPLANT
ELECT REM PT RETURN 9FT ADLT (ELECTROSURGICAL) ×4
ELECTRODE REM PT RTRN 9FT ADLT (ELECTROSURGICAL) ×2 IMPLANT
GAUZE PACKING 1/4 X5 YD (GAUZE/BANDAGES/DRESSINGS) IMPLANT
GAUZE PACKING IODOFORM 1X5 (PACKING) IMPLANT
GAUZE SPONGE 4X4 12PLY STRL (GAUZE/BANDAGES/DRESSINGS) ×4 IMPLANT
GAUZE XEROFORM 1X8 LF (GAUZE/BANDAGES/DRESSINGS) IMPLANT
GLOVE BIO SURGEON STRL SZ7 (GLOVE) ×4 IMPLANT
GLOVE INDICATOR 7.0 STRL GRN (GLOVE) ×4 IMPLANT
GOWN STRL REUS W/ TWL LRG LVL3 (GOWN DISPOSABLE) ×4 IMPLANT
GOWN STRL REUS W/TWL LRG LVL3 (GOWN DISPOSABLE) ×4
HANDPIECE VERSAJET DEBRIDEMENT (MISCELLANEOUS) ×4 IMPLANT
IV NS 1000ML (IV SOLUTION) ×2
IV NS 1000ML BAXH (IV SOLUTION) ×2 IMPLANT
KIT TURNOVER KIT A (KITS) ×4 IMPLANT
LABEL OR SOLS (LABEL) IMPLANT
NEEDLE FILTER BLUNT 18X 1/2SAF (NEEDLE) ×2
NEEDLE FILTER BLUNT 18X1 1/2 (NEEDLE) ×2 IMPLANT
NEEDLE HYPO 25X1 1.5 SAFETY (NEEDLE) ×8 IMPLANT
NS IRRIG 500ML POUR BTL (IV SOLUTION) ×4 IMPLANT
PACK EXTREMITY (MISCELLANEOUS) ×4 IMPLANT
PAD ABD DERMACEA PRESS 5X9 (GAUZE/BANDAGES/DRESSINGS) IMPLANT
PULSAVAC PLUS IRRIG FAN TIP (DISPOSABLE) ×4
RASP SM TEAR CROSS CUT (RASP) IMPLANT
SOL .9 NS 3000ML IRR  AL (IV SOLUTION) ×2
SOL .9 NS 3000ML IRR UROMATIC (IV SOLUTION) ×2 IMPLANT
SOL PREP PVP 2OZ (MISCELLANEOUS) ×4
SOLUTION PREP PVP 2OZ (MISCELLANEOUS) ×2 IMPLANT
STOCKINETTE STRL 6IN 960660 (GAUZE/BANDAGES/DRESSINGS) ×4 IMPLANT
SUT ETHILON 3-0 FS-10 30 BLK (SUTURE) ×4
SUT ETHILON 4-0 (SUTURE)
SUT ETHILON 4-0 FS2 18XMFL BLK (SUTURE)
SUT VIC AB 3-0 SH 27 (SUTURE)
SUT VIC AB 3-0 SH 27X BRD (SUTURE) IMPLANT
SUT VIC AB 4-0 FS2 27 (SUTURE) IMPLANT
SUTURE EHLN 3-0 FS-10 30 BLK (SUTURE) ×2 IMPLANT
SUTURE ETHLN 4-0 FS2 18XMF BLK (SUTURE) IMPLANT
SWAB CULTURE AMIES ANAERIB BLU (MISCELLANEOUS) IMPLANT
SYR 10ML LL (SYRINGE) ×4 IMPLANT
TIP FAN IRRIG PULSAVAC PLUS (DISPOSABLE) ×2 IMPLANT

## 2020-02-29 NOTE — Care Management Important Message (Signed)
Important Message  Patient Details  Name: [name]. MRN: [id number] Date of Birth: Jun 05, 1955   Medicare Important Message Given:  Yes     Juliann Pulse A Unika Nazareno 02/29/2020, 10:58 AM

## 2020-02-29 NOTE — Op Note (Signed)
[facility]    SURGEON: Caroline More, DPM  PRE-OPERATIVE DIAGNOSIS:  1.  Left foot wound status post incision and drainage with partial third ray amputation due to abscess and osteomyelitis third ray and gas gangrene 2.  Diabetes type 2 polyneuropathy 3.  PVD  POST-OPERATIVE DIAGNOSIS: Same  PROCEDURE(S): 1. Left foot wound debridement to the level of tendon with removal of all nonviable necrotic tissues; wound measures approximately 13.5 x 3 x 3 cm 2. Application of wound VAC  HEMOSTASIS: No tourniquet  ANESTHESIA: MAC  ESTIMATED BLOOD LOSS: 30 cc  FINDING(S): 1.  Nonviable necrotic tissue around the incision line for the incision and drainage with partial third ray amputation 2.  Mild bleeding occurred during debridement indicating fairly substantial peripheral vascular disease  PATHOLOGY/SPECIMEN(S): None  INDICATIONS:   Alec Less. is a 64 y.o. male who presents status post left foot incision and drainage with partial third ray amputation due to abscess, gas gangrene, and osteomyelitis along with cellulitis.  Patient's infection has gone down since his first procedure but presents today for further debridement and washout along with attempt at partial closure and application of wound VAC therapy.  All treatment options were discussed with the patient both conservative and surgical attempts at correction at this time patient has elected to proceed with procedure as described above..  DESCRIPTION: After obtaining full informed written consent, the patient was brought back to the operating room and placed supine upon the operating table.  The patient received IV antibiotics prior to induction.  After obtaining adequate anesthesia, 10 cc of half percent Marcaine plain was infiltrated about the operative site.  A high ankle tourniquet was placed but was never inflated during the case.  The patient was prepped and draped in the standard  fashion.  Attention was then directed to the left foot at the third ray level where all the sutures were removed and passed off the operative site.  The wound was then explored removing all nonviable necrotic tissues with use of curette, 15 blade, and Versajet.  The predebridement wound measurement measured approximately 13.5 x 3 x 3 cm.  The tissue was debrided down to the level of tendon.  There appeared to be some bleeding at the plantar aspect of the foot but the dorsal aspect of the foot had mild bleeding upon debridement and mild bleeding between the residual fourth and second toes indicating likely fairly substantial peripheral vascular disease.  Once all nonviable necrotic tissue was removed with use of debridement techniques the pulse lavage was used to irrigate the area with 3 L of normal sterile saline with bacitracin mixed in.  Hemostasis appeared to be well achieved at this time as once again it did not appear to be bleeding all that much after debridement.  The tissues though did appear to be healthy at this time after debridement although minimal bleeding occurred.  The post debridement wound measurement was the same as the predebridement.  The plantar portion of the incision was then reapproximated well coapted with retention type sutures with 3-0 nylon.  The wound VAC was then applied over the area of the open wound at the dorsal foot and between the second and third toes.  Suction was then applied and there is noted to be no leaks.  The suction was set at 125.  A postoperative dressing was then applied consisting of 4 x 4 gauze, Kerlix, Ace wrap.  Patient tolerated the procedure and anesthesia  well was transferred to recovery room vital signs stable and vascular status seemingly intact still to the left foot.  The patient will be discharged back to the inpatient room with the appropriate postoperative instructions and medications.  Patient will continue to be followed up with until  discharge.  COMPLICATIONS: None  CONDITION: Good, stable  Caroline More, DPM

## 2020-02-29 NOTE — Progress Notes (Signed)
PROGRESS NOTE    [name].  [name]:[id number] DOB: [birth year] DOA: 02/23/2020 PCP: [name], MD    Brief Narrative:   Alec Less. is a 64 y.o. male with medical history significant for peripheral artery disease on chronic Eliquis therapy, coronary artery disease status post PCI with drug-eluting stent to the mid RCA on 02/03/2020, type 2 diabetes mellitus, hypertension, obstructive sleep apnea compliant on home nocturnal CPAP, who is admitted to [facility] on 02/23/2020 with severe sepsis due to left lower extremity cellulitis after presenting from home to [facility] Emergency Department complaining of left lower extremity erythema.  The patient confirms that he underwent coronary angiography on 02/03/2020 which revealed 90% stenosis of the mid RCA which time he underwent PCI with drug-eluting stent placement to the mid RCA.  Per my review of the ensuing cardiology note, the patient is to remain on on dual antiplatelet therapy without interruption for the next 1 year.  Denies any ensuing chest pain, shortness of breath, palpitations, diaphoresis, dizziness, presyncope, or syncope  10/29-BG levels high in 300's. 10/30-no overnight issues. BG 200's.   Consultants:    podiatry, vascular surgery  Procedures:   Antimicrobials:   zosyn  Vancomycin-d/c'd [name]  Subjective: Has no complaints. No sob, pain, cp, +BM  Objective: Vitals:   02/28/20 0642 02/28/20 0754 02/28/20 1653 02/28/20 2356  BP:  135/81 138/81 135/61  Pulse:  70 71 70  Resp:  16 16 19   Temp:  98.4 F (36.9 C) 98.2 F (36.8 C) 98.1 F (36.7 C)  TempSrc:  Oral Oral Oral  SpO2:  97% 97% 99%  Weight: (!) 142.9 kg     Height:       No intake or output data in the 24 hours ending 02/29/20 0739 Filed Weights   02/26/20 0508 02/27/20 0500 02/28/20 0642  Weight: (!) 141.2 kg (!) 141.3 kg (!) 142.9 kg    Examination: Calm comfortable, NAD CTA, no wheeze  rales rhonchi's RRR S1-S2 no murmurs Soft benign positive bowel sounds Bilateral edema, left lower extremity erythema and warmth has improved now to one fourth left mid shin Alert oriented x3, grossly intact Mood and affect appropriate in current setting    Data Reviewed: I have personally reviewed following labs and imaging studies  CBC: Recent Labs  Lab 02/23/20 1058 02/24/20 0429 02/25/20 0455 02/27/20 0413  WBC 16.3* 14.3* 11.1* 6.7  NEUTROABS 14.1* 12.0*  --   --   HGB 12.8* 11.4* 11.5* 12.3*  HCT 38.9* 34.0* 35.1* 38.1*  MCV 92.6 91.9 92.9 92.5  PLT 161 155 146* 563   Basic Metabolic Panel: Recent Labs  Lab 02/23/20 1058 02/24/20 0429 02/25/20 0455 02/26/20 0427  NA 131* 130* 133* 134*  K 4.6 4.1 4.3 4.5  CL 96* 98 100 102  CO2 25 24 26 23   GLUCOSE 406* 387* 407* 344*  BUN 31* 33* 26* 22  CREATININE 1.34* 1.32* 1.06 0.97  CALCIUM 8.4* 7.8* 7.7* 7.8*  MG 1.8 1.8  --   --    GFR: Estimated Creatinine Clearance: 117.3 mL/min (by C-G formula based on SCr of 0.97 mg/dL). Liver Function Tests: Recent Labs  Lab 02/23/20 1058 02/24/20 0429  AST 17 15  ALT 19 16  ALKPHOS 83 75  BILITOT 0.9 0.7  PROT 6.7 5.8*  ALBUMIN 3.1* 2.6*   No results for input(s): LIPASE, AMYLASE in the last 168 hours. No results for input(s): AMMONIA in the last 168 hours.  Coagulation Profile: Recent Labs  Lab 02/24/20 0429  INR 1.3*   Cardiac Enzymes: No results for input(s): CKTOTAL, CKMB, CKMBINDEX, TROPONINI in the last 168 hours. BNP (last 3 results) No results for input(s): PROBNP in the last 8760 hours. HbA1C: No results for input(s): HGBA1C in the last 72 hours. CBG: Recent Labs  Lab 02/27/20 2116 02/28/20 0753 02/28/20 1137 02/28/20 1654 02/28/20 2104  GLUCAP 199* 181* 192* 160* 150*   Lipid Profile: No results for input(s): CHOL, HDL, LDLCALC, TRIG, CHOLHDL, LDLDIRECT in the last 72 hours. Thyroid Function Tests: No results for input(s): TSH, T4TOTAL,  FREET4, T3FREE, THYROIDAB in the last 72 hours. Anemia Panel: No results for input(s): VITAMINB12, FOLATE, FERRITIN, TIBC, IRON, RETICCTPCT in the last 72 hours. Sepsis Labs: Recent Labs  Lab 02/23/20 1058 02/23/20 1454  LATICACIDVEN 2.1* 1.5    Recent Results (from the past 240 hour(s))  Culture, blood (routine x 2)     Status: None   Collection Time: 02/23/20 10:58 AM   Specimen: BLOOD  Result Value Ref Range Status   Specimen Description BLOOD BLOOD RIGHT FOREARM  Final   Special Requests   Final    BOTTLES DRAWN AEROBIC AND ANAEROBIC Blood Culture adequate volume   Culture   Final    NO GROWTH 5 DAYS Performed at [facility], [address] [place], [place] [address]    Report Status 02/28/2020 FINAL  Final  Culture, blood (routine x 2)     Status: None   Collection Time: 02/23/20 10:58 AM   Specimen: BLOOD  Result Value Ref Range Status   Specimen Description BLOOD Blood Culture adequate volume  Final   Special Requests   Final    BOTTLES DRAWN AEROBIC AND ANAEROBIC LEFT ANTECUBITAL   Culture   Final    NO GROWTH 5 DAYS Performed at [facility], [address] [place], [place] [address]    Report Status 02/28/2020 FINAL  Final  Respiratory Panel by RT PCR (Flu A&B, Covid) - Nasopharyngeal Swab     Status: None   Collection Time: 02/23/20 11:59 AM   Specimen: Nasopharyngeal Swab  Result Value Ref Range Status   SARS Coronavirus 2 by RT PCR NEGATIVE NEGATIVE Final    Comment: (NOTE) SARS-CoV-2 target nucleic acids are NOT DETECTED.  The SARS-CoV-2 RNA is generally detectable in upper respiratoy specimens during the acute phase of infection. The lowest concentration of SARS-CoV-2 viral copies this assay can detect is 131 copies/mL. A negative result does not preclude SARS-Cov-2 infection and should not be used as the sole basis for treatment or other patient management decisions. A negative result may occur with  improper specimen  collection/handling, submission of specimen other than nasopharyngeal swab, presence of viral mutation(s) within the areas targeted by this assay, and inadequate number of viral copies (<131 copies/mL). A negative result must be combined with clinical observations, patient history, and epidemiological information. The expected result is Negative.  Fact Sheet for Patients:  PinkCheek.be  Fact Sheet for Healthcare Providers:  GravelBags.it  This test is no t yet approved or cleared by the [place] FDA and  has been authorized for detection and/or diagnosis of SARS-CoV-2 by FDA under an Emergency Use Authorization (EUA). This EUA will remain  in effect (meaning this test can be used) for the duration of the COVID-19 declaration under Section 564(b)(1) of the Act, 21 U.S.C. section 360bbb-3(b)(1), unless the authorization is terminated or revoked sooner.     Influenza A by  PCR NEGATIVE NEGATIVE Final   Influenza B by PCR NEGATIVE NEGATIVE Final    Comment: (NOTE) The Xpert Xpress SARS-CoV-2/FLU/RSV assay is intended as an aid in  the diagnosis of influenza from Nasopharyngeal swab specimens and  should not be used as a sole basis for treatment. Nasal washings and  aspirates are unacceptable for Xpert Xpress SARS-CoV-2/FLU/RSV  testing.  Fact Sheet for Patients: PinkCheek.be  Fact Sheet for Healthcare Providers: GravelBags.it  This test is not yet approved or cleared by the [place] FDA and  has been authorized for detection and/or diagnosis of SARS-CoV-2 by  FDA under an Emergency Use Authorization (EUA). This EUA will remain  in effect (meaning this test can be used) for the duration of the  Covid-19 declaration under Section 564(b)(1) of the Act, 21  U.S.C. section 360bbb-3(b)(1), unless the authorization is  terminated or revoked. Performed at [facility], [address], [place], [place] [address]   Surgical pcr screen     Status: None   Collection Time: 02/24/20 12:25 PM   Specimen: Nasal Mucosa; Nasal Swab  Result Value Ref Range Status   MRSA, PCR NEGATIVE NEGATIVE Final   Staphylococcus aureus NEGATIVE NEGATIVE Final    Comment: (NOTE) The Xpert SA Assay (FDA approved for NASAL specimens in patients 22 years of age and older), is one component of a comprehensive surveillance program. It is not intended to diagnose infection nor to guide or monitor treatment. Performed at [facility], [address] [place], [place] [address]   Aerobic/Anaerobic Culture (surgical/deep wound)     Status: None (Preliminary result)   Collection Time: 02/24/20  5:44 PM   Specimen: PATH Other; Body Fluid  Result Value Ref Range Status   Specimen Description   Final    WOUND 3RD RAY LEFT FOOT SAMPLE A Performed at [facility], [address] [place], [place] [address]    Special Requests   Final    NONE Performed at [facility], [place], [place] [address]    Gram Stain   Final    NO WBC SEEN ABUNDANT GRAM POSITIVE COCCI FEW GRAM NEGATIVE DIPLOCOCCI Performed at [facility], [place] [address], [place], [place] [address]    Culture   Final    MODERATE GROUP B STREP(S.AGALACTIAE)ISOLATED TESTING AGAINST S. AGALACTIAE NOT ROUTINELY PERFORMED DUE TO PREDICTABILITY OF AMP/PEN/VAN SUSCEPTIBILITY. RARE METHICILLIN RESISTANT STAPHYLOCOCCUS AUREUS NO ANAEROBES ISOLATED; CULTURE IN PROGRESS FOR 5 DAYS    Report Status PENDING  Incomplete   Organism ID, Bacteria METHICILLIN RESISTANT STAPHYLOCOCCUS AUREUS  Final      Susceptibility   Methicillin resistant staphylococcus aureus - MIC*    CIPROFLOXACIN >=8 RESISTANT Resistant     ERYTHROMYCIN <=0.25 SENSITIVE Sensitive     GENTAMICIN <=0.5 SENSITIVE Sensitive     OXACILLIN >=4 RESISTANT Resistant     TETRACYCLINE <=1 SENSITIVE Sensitive      VANCOMYCIN 1 SENSITIVE Sensitive     TRIMETH/SULFA 20 SENSITIVE Sensitive     CLINDAMYCIN <=0.25 SENSITIVE Sensitive     RIFAMPIN <=0.5 SENSITIVE Sensitive     Inducible Clindamycin NEGATIVE Sensitive     * RARE METHICILLIN RESISTANT STAPHYLOCOCCUS AUREUS  Surgical pcr screen     Status: None   Collection Time: 02/28/20  6:45 PM   Specimen: Nasal Mucosa; Nasal Swab  Result Value Ref Range Status   MRSA, PCR NEGATIVE NEGATIVE Final   Staphylococcus aureus NEGATIVE NEGATIVE Final    Comment: (  NOTE) The Xpert SA Assay (FDA approved for NASAL specimens in patients 31 years of age and older), is one component of a comprehensive surveillance program. It is not intended to diagnose infection nor to guide or monitor treatment. Performed at [facility], [address] [place], [place] [address]          Radiology Studies: No results found.      Scheduled Meds: . acidophilus  1 capsule Oral Daily  . apixaban  5 mg Oral BID  . aspirin EC  81 mg Oral Daily  . atorvastatin  40 mg Oral QHS  . chlorhexidine  60 mL Topical Once  . clopidogrel  75 mg Oral Q breakfast  . feeding supplement  296 mL Oral Once  . insulin aspart  0-15 Units Subcutaneous TID WC  . insulin aspart  15 Units Subcutaneous TID WC  . insulin glargine  40 Units Subcutaneous BID  . linezolid  600 mg Oral Q12H  . metoprolol tartrate  50 mg Oral BID  . pramipexole  1 mg Oral QHS  . primidone  250 mg Oral BID  . tamsulosin  0.4 mg Oral Daily   Continuous Infusions: . piperacillin-tazobactam (ZOSYN)  IV 3.375 g (02/29/20 0116)    Assessment & Plan:   Principal Problem:   Cellulitis of left lower extremity Active Problems:   Diabetes mellitus type 2 in obese (HCC)   PAD (peripheral artery disease) (HCC)   CAD (coronary artery disease)   Severe sepsis (HCC)   AKI (acute kidney injury) ([place])   #)  sepsis due to cellulitis/left third MTPJ ulceration with gas gangrene:  plain films and MRI  showing evidence of subcutaneous swelling consistent with cellulitis in the absence of any evidence of acute osteomyelitis.   Status post left partial third ray amputation by Dr. Luana Shu on 10/27 10/29-status post angiogram by Dr. Lucky Cowboy on 10/28.  Status post angioplasty of the left posterior tibial artery with drug-coated angioplasty balloon, angioplasty of the left popliteal artery and proximal tibioperoneal trunk also 11/1-plan to go to the OR this afternoon for further debridement and washout and potential wound VAC application Wound cultures growing strep agalactiae and staph aureus  Per podiatry patient likely will need to go back to the OR for further debridement and washout and potential wound VAC application. Patient will be placed on n.p.o. at 8 AM on 02/29/2020 for surgical procedure at some point in the afternoon/evening Patient should be nonweightbearing for the most part of all times to the left lower extremity but can use heel contact and surgical shoe when performing transfers. PT consult Patient should try to stay off foot as much as possible at this time Wound cultures growing strep agalactiae and staph aureurs, MRSA ID following-will continue with linezolid and Zosyn    #) Acute kidney injury: Presenting creatinine noted to be 1.34 relative to most recent prior value of 0.823 when checked on 02/03/2020.   Likely due to intravascular depletion/severe sepsis.  Likely exacerbated with losartan. 11/1-continue holding ARB for now  Creatinine did improve with hydration.  Creatinine is 0.97  Will check periodically     #) Obstructive sleep apnea: On CPAP at home Reports compliance     #) Type 2 diabetes mellitus with hyperglycemia-uncontrolled Hemoglobin A1c 9.7 Discussed with patient importance of controlling his diabetes as outpatient Likely infection contributing to elevated blood sugars 2.   11/1- BG levels improved.  On Lantus 40U bid, but will give 1/2 of morning  dose  since he is NPO. discussed this with pt. And he is agreeable. Will continue with novolog with meals. RISS    #) Peripheral artery disease/CAD: Patient had coronary cath on 02/03/2020 status post DES to mid RCA S/p angiogram on 10/28 Status post angiogram on 02/25/2020-please see #1.  Status post angioplasty. Continue with Plavix and Eliquis per cardiology without interruption for 1 year Continue beta-blockers and statins   #) BPH: On Flomax      DVT prophylaxis: Eliquis Code Status: Full Family Communication: None at bedside  Status is: Inpatient  Remains inpatient appropriate because:IV treatments appropriate due to intensity of illness or inability to take PO   Dispo: The patient is from: Home              Anticipated d/c is to: Home              Anticipated d/c date is: 3 days              Patient currently is not medically stable to d/c.  Will be going to the OR today           LOS: 6 days   Time spent: 35 min with >50% on coc   Nolberto Hanlon, MD [facility] Pager [phone]  If 7PM-7AM, please contact night-coverage [email or web address] Password [facility] 02/29/2020, 7:39 AM

## 2020-02-29 NOTE — Progress Notes (Signed)
[place] INFECTIOUS DISEASE PROGRESS NOTE Date of Admission:  02/23/2020     ID: Alec Less. is a 64 y.o. male with L foot cellulitis  Principal Problem:   Cellulitis of left lower extremity Active Problems:   Diabetes mellitus type 2 in obese (HCC)   PAD (peripheral artery disease) (HCC)   CAD (coronary artery disease)   Severe sepsis (HCC)   AKI (acute kidney injury) ([place])   Subjective: No fevers, wound improving per wife. For or this pm  ROS  Eleven systems are reviewed and negative except per hpi  Medications:  Antibiotics Given (last 72 hours)    Date/Time Action Medication Dose Rate   02/26/20 1706 New Bag/Given   piperacillin-tazobactam (ZOSYN) IVPB 3.375 g 3.375 g 12.5 mL/hr   02/26/20 2144 Given   linezolid (ZYVOX) tablet 600 mg 600 mg    02/27/20 0209 New Bag/Given   piperacillin-tazobactam (ZOSYN) IVPB 3.375 g 3.375 g 12.5 mL/hr   02/27/20 0900 Given   linezolid (ZYVOX) tablet 600 mg 600 mg    02/27/20 1224 New Bag/Given   piperacillin-tazobactam (ZOSYN) IVPB 3.375 g 3.375 g 12.5 mL/hr   02/27/20 1805 New Bag/Given   piperacillin-tazobactam (ZOSYN) IVPB 3.375 g 3.375 g 12.5 mL/hr   02/27/20 2258 Given   linezolid (ZYVOX) tablet 600 mg 600 mg    02/28/20 0210 New Bag/Given   piperacillin-tazobactam (ZOSYN) IVPB 3.375 g 3.375 g 12.5 mL/hr   02/28/20 0930 Given   linezolid (ZYVOX) tablet 600 mg 600 mg    02/28/20 8786 New Bag/Given   piperacillin-tazobactam (ZOSYN) IVPB 3.375 g 3.375 g 12.5 mL/hr   02/28/20 1722 New Bag/Given   piperacillin-tazobactam (ZOSYN) IVPB 3.375 g 3.375 g 12.5 mL/hr   02/29/20 0116 New Bag/Given   piperacillin-tazobactam (ZOSYN) IVPB 3.375 g 3.375 g 12.5 mL/hr   02/29/20 0855 New Bag/Given   piperacillin-tazobactam (ZOSYN) IVPB 3.375 g 3.375 g 12.5 mL/hr     . acidophilus  1 capsule Oral Daily  . apixaban  5 mg Oral BID  . aspirin EC  81 mg Oral Daily  . atorvastatin  40 mg Oral QHS  . chlorhexidine  60 mL  Topical Once  . clopidogrel  75 mg Oral Q breakfast  . feeding supplement  296 mL Oral Once  . insulin aspart  0-15 Units Subcutaneous TID WC  . insulin aspart  15 Units Subcutaneous TID WC  . insulin glargine  40 Units Subcutaneous BID  . linezolid  600 mg Oral Q12H  . metoprolol tartrate  50 mg Oral BID  . pramipexole  1 mg Oral QHS  . primidone  250 mg Oral BID  . tamsulosin  0.4 mg Oral Daily    Objective: Vital signs in last 24 hours: Temp:  [98.1 F (36.7 C)-98.2 F (36.8 C)] 98.1 F (36.7 C) (11/01 0800) Pulse Rate:  [70-71] 70 (11/01 0800) Resp:  [16-19] 18 (11/01 0800) BP: (135-144)/(61-81) 144/78 (11/01 0800) SpO2:  [97 %-99 %] 97 % (11/01 0800) Physical Exam  Constitutional: He is oriented to person, place, and time. He appears well-developed and well-nourished. No distress. obese HENT: ancietric Abdominal: Soft.  Neurological: He is alert and oriented to person, place, and time.  Skin: LLE wound examined- see photos Psychiatric: He has a normal mood and affect. His behavior is normal.         Lab Results Recent Labs    02/27/20 0413  WBC 6.7  HGB 12.3*  HCT 38.1*    Microbiology: Results  for orders placed or performed during the hospital encounter of 02/23/20  Culture, blood (routine x 2)     Status: None   Collection Time: 02/23/20 10:58 AM   Specimen: BLOOD  Result Value Ref Range Status   Specimen Description BLOOD BLOOD RIGHT FOREARM  Final   Special Requests   Final    BOTTLES DRAWN AEROBIC AND ANAEROBIC Blood Culture adequate volume   Culture   Final    NO GROWTH 5 DAYS Performed at [facility], [address] [place], [place] [address]    Report Status 02/28/2020 FINAL  Final  Culture, blood (routine x 2)     Status: None   Collection Time: 02/23/20 10:58 AM   Specimen: BLOOD  Result Value Ref Range Status   Specimen Description BLOOD Blood Culture adequate volume  Final   Special Requests   Final    BOTTLES DRAWN  AEROBIC AND ANAEROBIC LEFT ANTECUBITAL   Culture   Final    NO GROWTH 5 DAYS Performed at [facility], [address] [place], [place] [address]    Report Status 02/28/2020 FINAL  Final  Respiratory Panel by RT PCR (Flu A&B, Covid) - Nasopharyngeal Swab     Status: None   Collection Time: 02/23/20 11:59 AM   Specimen: Nasopharyngeal Swab  Result Value Ref Range Status   SARS Coronavirus 2 by RT PCR NEGATIVE NEGATIVE Final    Comment: (NOTE) SARS-CoV-2 target nucleic acids are NOT DETECTED.  The SARS-CoV-2 RNA is generally detectable in upper respiratoy specimens during the acute phase of infection. The lowest concentration of SARS-CoV-2 viral copies this assay can detect is 131 copies/mL. A negative result does not preclude SARS-Cov-2 infection and should not be used as the sole basis for treatment or other patient management decisions. A negative result may occur with  improper specimen collection/handling, submission of specimen other than nasopharyngeal swab, presence of viral mutation(s) within the areas targeted by this assay, and inadequate number of viral copies (<131 copies/mL). A negative result must be combined with clinical observations, patient history, and epidemiological information. The expected result is Negative.  Fact Sheet for Patients:  PinkCheek.be  Fact Sheet for Healthcare Providers:  GravelBags.it  This test is no t yet approved or cleared by the [place] FDA and  has been authorized for detection and/or diagnosis of SARS-CoV-2 by FDA under an Emergency Use Authorization (EUA). This EUA will remain  in effect (meaning this test can be used) for the duration of the COVID-19 declaration under Section 564(b)(1) of the Act, 21 U.S.C. section 360bbb-3(b)(1), unless the authorization is terminated or revoked sooner.     Influenza A by PCR NEGATIVE NEGATIVE Final   Influenza B by PCR  NEGATIVE NEGATIVE Final    Comment: (NOTE) The Xpert Xpress SARS-CoV-2/FLU/RSV assay is intended as an aid in  the diagnosis of influenza from Nasopharyngeal swab specimens and  should not be used as a sole basis for treatment. Nasal washings and  aspirates are unacceptable for Xpert Xpress SARS-CoV-2/FLU/RSV  testing.  Fact Sheet for Patients: PinkCheek.be  Fact Sheet for Healthcare Providers: GravelBags.it  This test is not yet approved or cleared by the [place] FDA and  has been authorized for detection and/or diagnosis of SARS-CoV-2 by  FDA under an Emergency Use Authorization (EUA). This EUA will remain  in effect (meaning this test can be used) for the duration of the  Covid-19 declaration under Section 564(b)(1) of the Act, 21  U.S.C. section  360bbb-3(b)(1), unless the authorization is  terminated or revoked. Performed at [facility], [address] [place], [place] [address]   Surgical pcr screen     Status: None   Collection Time: 02/24/20 12:25 PM   Specimen: Nasal Mucosa; Nasal Swab  Result Value Ref Range Status   MRSA, PCR NEGATIVE NEGATIVE Final   Staphylococcus aureus NEGATIVE NEGATIVE Final    Comment: (NOTE) The Xpert SA Assay (FDA approved for NASAL specimens in patients 72 years of age and older), is one component of a comprehensive surveillance program. It is not intended to diagnose infection nor to guide or monitor treatment. Performed at [facility], [address] [place], [place] [address]   Aerobic/Anaerobic Culture (surgical/deep wound)     Status: None (Preliminary result)   Collection Time: 02/24/20  5:44 PM   Specimen: PATH Other; Body Fluid  Result Value Ref Range Status   Specimen Description   Final    WOUND 3RD RAY LEFT FOOT SAMPLE A Performed at [facility], [address], [place], [place] [address]    Special Requests   Final     NONE Performed at [facility], [place], [place] [address]    Gram Stain   Final    NO WBC SEEN ABUNDANT GRAM POSITIVE COCCI FEW GRAM NEGATIVE DIPLOCOCCI Performed at [facility], [place] [address] [place], [place] [address]    Culture   Final    MODERATE GROUP B STREP(S.AGALACTIAE)ISOLATED TESTING AGAINST S. AGALACTIAE NOT ROUTINELY PERFORMED DUE TO PREDICTABILITY OF AMP/PEN/VAN SUSCEPTIBILITY. RARE METHICILLIN RESISTANT STAPHYLOCOCCUS AUREUS NO ANAEROBES ISOLATED; CULTURE IN PROGRESS FOR 5 DAYS    Report Status PENDING  Incomplete   Organism ID, Bacteria METHICILLIN RESISTANT STAPHYLOCOCCUS AUREUS  Final      Susceptibility   Methicillin resistant staphylococcus aureus - MIC*    CIPROFLOXACIN >=8 RESISTANT Resistant     ERYTHROMYCIN <=0.25 SENSITIVE Sensitive     GENTAMICIN <=0.5 SENSITIVE Sensitive     OXACILLIN >=4 RESISTANT Resistant     TETRACYCLINE <=1 SENSITIVE Sensitive     VANCOMYCIN 1 SENSITIVE Sensitive     TRIMETH/SULFA 20 SENSITIVE Sensitive     CLINDAMYCIN <=0.25 SENSITIVE Sensitive     RIFAMPIN <=0.5 SENSITIVE Sensitive     Inducible Clindamycin NEGATIVE Sensitive     * RARE METHICILLIN RESISTANT STAPHYLOCOCCUS AUREUS  Surgical pcr screen     Status: None   Collection Time: 02/28/20  6:45 PM   Specimen: Nasal Mucosa; Nasal Swab  Result Value Ref Range Status   MRSA, PCR NEGATIVE NEGATIVE Final   Staphylococcus aureus NEGATIVE NEGATIVE Final    Comment: (NOTE) The Xpert SA Assay (FDA approved for NASAL specimens in patients 77 years of age and older), is one component of a comprehensive surveillance program. It is not intended to diagnose infection nor to guide or monitor treatment. Performed at [facility], [address], [place], [place] [address]     Studies/Results: No results found.  Assessment/Plan: 64 yo with DM, PAD admitted with L foot infection and gas gangrene.. S/p angioplasty LLE. S/p L 3rd ray  amp 10/27. 11/1 - for repeat surgery today. Foot still quite inflamed.  Cultures with MRSA and GBS.   Rec Proceed with surgery today. Cont zosyn and linezolid.    Thank you very much for the consult. Will follow with you.  Leonel Ramsay   02/29/2020, 1:13 PM

## 2020-02-29 NOTE — Progress Notes (Signed)
Pharmacy Antibiotic Note  [name]. is a 64 y.o. male admitted on 02/23/2020 with sepsis and cellulitis.  Pharmacy consulted for Vancomycin and Zosyn dosing. Vancomycin was changed to linezolid 10/28.  Day 7 IV abx    Plan: Continue Zosyn 3.375g IV q8h extended infusion.  Monitor renal function closely.  Height: 6\' 4"  (193 cm) Weight: (!) 142.9 kg (315 lb) IBW/kg (Calculated) : 86.8  Temp (24hrs), Avg:98.1 F (36.7 C), Min:98.1 F (36.7 C), Max:98.2 F (36.8 C)  Recent Labs  Lab 02/23/20 1058 02/23/20 1454 02/24/20 0429 02/25/20 0455 02/26/20 0427 02/27/20 0413  WBC 16.3*  --  14.3* 11.1*  --  6.7  CREATININE 1.34*  --  1.32* 1.06 0.97  --   LATICACIDVEN 2.1* 1.5  --   --   --   --     Estimated Creatinine Clearance: 117.3 mL/min (by C-G formula based on SCr of 0.97 mg/dL).    No Known Allergies  Antimicrobials this admission: vancomcyin 10/26 >> 10/28 zosyn 10/26 >>  Linezolid 10/28>>   Microbiology results: 10/28 Left Foot Cx: MODERATE GROUP B STREP(S.AGALACTIAE); MRSA 10/26 Bcx: NG TD 10/27 MRSA PCR: negative   Thank you for allowing pharmacy to be a part of this patient's care.  Rowland Lathe, PharmD Clinical Pharmacist 02/29/2020 10:55 AM

## 2020-02-29 NOTE — H&P (Signed)
HISTORY AND PHYSICAL INTERVAL NOTE:  02/29/2020  4:46 PM  [name].  has presented today for surgery, with the diagnosis of left foot abscess/wound.  The various methods of treatment have been discussed with the patient.  No guarantees were given.  After consideration of risks, benefits and other options for treatment, the patient has consented to surgery.  I have reviewed the patients' chart and labs.   PROCEDURE: LEFT FOOT INCISION AND DRAINAGE WITH REMOVAL OF ALL NONVIABLE AND NECROTIC TISSUE WITH WOUND DEBRIDEMENT, POSSIBLE WOUND VAC APPLICATION  A history and physical examination was performed in hospital.  The patient was reexamined.  There have been no changes to this history and physical examination.  Caroline More, DPM

## 2020-02-29 NOTE — Progress Notes (Signed)
PT Cancellation Note  Patient Details Name: [name] MRN: [id number] DOB: [birth year]   Cancelled Treatment:     Chart reviewed & attempted to see pt. Initial attempt pt toileting with family member reporting pt isn't feeling well this AM. Returned later & pt lying in bed declining participation at this time, reporting fatigue & LLE hurting "like a toothache all night" with pt noting he has had pain medication. Pt anticipating I&D later today as well. Will f/u as able & when pt is willing.  Lavone Nian, PT, DPT 02/29/20, 11:37 AM    [name] 02/29/2020, 11:36 AM

## 2020-02-29 NOTE — Anesthesia Postprocedure Evaluation (Signed)
Anesthesia Post Note  Patient: [name].  Procedure(s) Performed: IRRIGATION AND DEBRIDEMENT FOOT (Left ) APPLICATION OF WOUND VAC (Left Foot)  Patient location during evaluation: PACU Anesthesia Type: General Level of consciousness: awake and alert Pain management: pain level controlled Vital Signs Assessment: post-procedure vital signs reviewed and stable Respiratory status: spontaneous breathing, nonlabored ventilation, respiratory function stable and patient connected to nasal cannula oxygen Cardiovascular status: blood pressure returned to baseline and stable Postop Assessment: no apparent nausea or vomiting Anesthetic complications: no   No complications documented.   Last Vitals:  Vitals:   02/29/20 1826 02/29/20 1830  BP: 136/72   Pulse: 81 75  Resp: 14 15  Temp:  (!) 36.1 C  SpO2: 98% 100%    Last Pain:  Vitals:   02/29/20 1830  TempSrc:   PainSc: 0-No pain                 Precious Haws Gizel Riedlinger

## 2020-02-29 NOTE — Transfer of Care (Signed)
Immediate Anesthesia Transfer of Care Note  Patient: [name]  Procedure(s) Performed: IRRIGATION AND DEBRIDEMENT FOOT (Left ) APPLICATION OF WOUND VAC (Left Foot)  Patient Location: PACU  Anesthesia Type:General  Level of Consciousness: awake, drowsy and patient cooperative  Airway & Oxygen Therapy: Patient Spontanous Breathing and Patient connected to nasal cannula oxygen  Post-op Assessment: Report given to RN and Post -op Vital signs reviewed and stable  Post vital signs: Reviewed and stable  Last Vitals:  Vitals Value Taken Time  BP 125/61 02/29/20 1811  Temp 36.1 C 02/29/20 1808  Pulse 76 02/29/20 1815  Resp 19 02/29/20 1815  SpO2 98 % 02/29/20 1815    Last Pain:  Vitals:   02/29/20 1808  TempSrc:   PainSc: 0-No pain      Patients Stated Pain Goal: 0 (68/15/94 7076)  Complications: No complications documented.

## 2020-02-29 NOTE — Anesthesia Preprocedure Evaluation (Signed)
Anesthesia Evaluation  Patient identified by MRN, date of birth, ID band Patient awake    Reviewed: Allergy & Precautions, H&P , NPO status , Patient's Chart, lab work & pertinent test results, reviewed documented beta blocker date and time   History of Anesthesia Complications (+) AWARENESS UNDER ANESTHESIA and history of anesthetic complications  Airway Mallampati: II  TM Distance: >3 FB Neck ROM: full    Dental  (+) Edentulous Upper, Upper Dentures, Missing, Dental Advidsory Given, Poor Dentition, Caps   Pulmonary neg shortness of breath, asthma , sleep apnea and Continuous Positive Airway Pressure Ventilation , COPD, neg recent URI, Current Smoker, former smoker,    Pulmonary exam normal        Cardiovascular Exercise Tolerance: Good hypertension, (-) angina+ CAD, + Past MI, + Cardiac Stents, + CABG and + Peripheral Vascular Disease  Normal cardiovascular exam(-) dysrhythmias (-) Valvular Problems/Murmurs     Neuro/Psych PSYCHIATRIC DISORDERS Depression negative neurological ROS     GI/Hepatic negative GI ROS, Neg liver ROS,   Endo/Other  diabetesMorbid obesity  Renal/GU Renal disease     Musculoskeletal   Abdominal   Peds  Hematology negative hematology ROS (+)   Anesthesia Other Findings Past Medical History: No date: Asthma No date: Coronary artery disease No date: Depression No date: Diabetes mellitus without complication (HCC) No date: Gout No date: History anabolic steroid use No date: Hyperlipidemia No date: Hypertension No date: Hypogonadism in male No date: Morbid obesity (HCC) No date: Myocardial infarction ([place]) No date: Peripheral vascular disease ([place]) No date: Perirectal abscess No date: Pleurisy No date: Sleep apnea     Comment:  CPAP at night, no oxygen No date: Varicella   Reproductive/Obstetrics negative OB ROS                             Anesthesia  Physical  Anesthesia Plan  ASA: IV and emergent  Anesthesia Plan: General   Post-op Pain Management:    Induction: Intravenous  PONV Risk Score and Plan: 2 and Propofol infusion and TIVA  Airway Management Planned: Natural Airway and Simple Face Mask  Additional Equipment:   Intra-op Plan:   Post-operative Plan:   Informed Consent: I have reviewed the patients History and Physical, chart, labs and discussed the procedure including the risks, benefits and alternatives for the proposed anesthesia with the patient or authorized representative who has indicated his/her understanding and acceptance.     Dental Advisory Given  Plan Discussed with: CRNA  Anesthesia Plan Comments: (Patient consented for risks of anesthesia including but not limited to:  - adverse reactions to medications - risk of intubation if required - damage to eyes, teeth, lips or other oral mucosa - nerve damage due to positioning  - sore throat or hoarseness - Damage to heart, brain, nerves, lungs, other parts of body or loss of life  Patient voiced understanding.)        Anesthesia Quick Evaluation

## 2020-03-01 ENCOUNTER — Encounter: Payer: Self-pay | Admitting: Podiatry

## 2020-03-01 DIAGNOSIS — Z9862 Peripheral vascular angioplasty status: Secondary | ICD-10-CM

## 2020-03-01 DIAGNOSIS — Z89422 Acquired absence of other left toe(s): Secondary | ICD-10-CM

## 2020-03-01 DIAGNOSIS — E1140 Type 2 diabetes mellitus with diabetic neuropathy, unspecified: Secondary | ICD-10-CM

## 2020-03-01 DIAGNOSIS — E1151 Type 2 diabetes mellitus with diabetic peripheral angiopathy without gangrene: Secondary | ICD-10-CM

## 2020-03-01 DIAGNOSIS — Z794 Long term (current) use of insulin: Secondary | ICD-10-CM

## 2020-03-01 DIAGNOSIS — M8610 Other acute osteomyelitis, unspecified site: Secondary | ICD-10-CM

## 2020-03-01 LAB — CBC
HCT: 37.3 % — ABNORMAL LOW (ref 39.0–52.0)
Hemoglobin: 12.1 g/dL — ABNORMAL LOW (ref 13.0–17.0)
MCH: 30 pg (ref 26.0–34.0)
MCHC: 32.4 g/dL (ref 30.0–36.0)
MCV: 92.6 fL (ref 80.0–100.0)
Platelets: 269 10*3/uL (ref 150–400)
RBC: 4.03 MIL/uL — ABNORMAL LOW (ref 4.22–5.81)
RDW: 13.3 % (ref 11.5–15.5)
WBC: 7.5 10*3/uL (ref 4.0–10.5)
nRBC: 0 % (ref 0.0–0.2)

## 2020-03-01 LAB — AEROBIC/ANAEROBIC CULTURE W GRAM STAIN (SURGICAL/DEEP WOUND): Gram Stain: NONE SEEN

## 2020-03-01 LAB — BASIC METABOLIC PANEL
Anion gap: 8 (ref 5–15)
BUN: 13 mg/dL (ref 8–23)
CO2: 29 mmol/L (ref 22–32)
Calcium: 8.6 mg/dL — ABNORMAL LOW (ref 8.9–10.3)
Chloride: 102 mmol/L (ref 98–111)
Creatinine, Ser: 0.99 mg/dL (ref 0.61–1.24)
GFR, Estimated: >60 mL/min (ref >60)
Glucose, Bld: 183 mg/dL — ABNORMAL HIGH (ref 70–99)
Potassium: 4.8 mmol/L (ref 3.5–5.1)
Sodium: 139 mmol/L (ref 135–145)

## 2020-03-01 LAB — GLUCOSE, CAPILLARY
Glucose-Capillary: 147 mg/dL — ABNORMAL HIGH (ref 70–99)
Glucose-Capillary: 219 mg/dL — ABNORMAL HIGH (ref 70–99)
Glucose-Capillary: 158 mg/dL — ABNORMAL HIGH (ref 70–99)
Glucose-Capillary: 188 mg/dL — ABNORMAL HIGH (ref 70–99)

## 2020-03-01 MED ORDER — HYDROCODONE-ACETAMINOPHEN 5-325 MG PO TABS
1.0000 | ORAL_TABLET | ORAL | Status: DC | PRN
  Administered 2020-03-01 – 2020-03-02 (×6): 1 via ORAL
  Filled 2020-03-01 (×6): qty 1

## 2020-03-01 MED ORDER — SODIUM CHLORIDE 0.9 % IV SOLN
3.0000 g | Freq: Four times a day (QID) | INTRAVENOUS | Status: DC
  Administered 2020-03-01 – 2020-03-03 (×8): 3 g via INTRAVENOUS
  Filled 2020-03-01: qty 0.75
  Filled 2020-03-01: qty 8
  Filled 2020-03-01: qty 0.75
  Filled 2020-03-01: qty 8
  Filled 2020-03-01: qty 3
  Filled 2020-03-01: qty 8
  Filled 2020-03-01: qty 0.75
  Filled 2020-03-01: qty 3
  Filled 2020-03-01: qty 8
  Filled 2020-03-01: qty 0.75
  Filled 2020-03-01: qty 8
  Filled 2020-03-01 (×2): qty 0.75

## 2020-03-01 NOTE — Progress Notes (Signed)
Physical Therapy Treatment Patient Details Name: [name]. MRN: [id number] DOB: [birth year] Today's Date: 03/01/2020    History of Present Illness Pt is a 64 y/o M admitted on 02/23/20 for sepsis 2/2 L foot redness & swelling & pt found to have cellulitis. Pt underwent L partial 3rd ray amputation, I&D of L foot on 02/24/20 & now NWB LLE. Pt also underwent Lower Extremity Angiography (Left) on 02/25/20. PMH: PAD, CAD s/p PCI with drug eluting stent to mid RCA on 02/03/20, DM2, HTN, OSA, asthma, depression, gout, HLD, obesity, MI.    PT Comments    Pt received in supine position upon arrival and agreeable to therapy.  Due to weight bearing restrictions, bed-level exercises performed and listed below.  Pt was able to increase total amount of exercises while in supine position.  Pt requested cotton gown due to sweating in slick gown.  Nursing notified and gown will be donned when vitals are taken next.  Pt self-reports that he has been getting to EOB independently and does not usually need much assistance with bed mobility.  Current discharge plans to HHPT remain appropriate at this time.  Pt will continue to benefit from skilled therapy in order to address deficits listed below.   Follow Up Recommendations  Home health PT;Supervision for mobility/OOB     Equipment Recommendations       Recommendations for Other Services       Precautions / Restrictions Precautions Precautions: Fall Restrictions Weight Bearing Restrictions: Yes RLE Weight Bearing: Weight bearing as tolerated LLE Weight Bearing: Non weight bearing Other Position/Activity Restrictions: Per pt, unable to weight bear on LLE follow surgical procedure last ngiht.    Mobility  Bed Mobility                  Transfers                    Ambulation/Gait                 Stairs             Wheelchair Mobility    Modified Rankin (Stroke Patients Only)       Balance        Sitting balance - Comments: Not assessed, performed bed-level exercises.                                    Cognition Arousal/Alertness: Awake/alert Behavior During Therapy: WFL for tasks assessed/performed Overall Cognitive Status: Within Functional Limits for tasks assessed                                        Exercises General Exercises - Lower Extremity Ankle Circles/Pumps: AROM;Strengthening;Both;20 reps;Supine Quad Sets: AROM;Strengthening;Both;20 reps;Supine Gluteal Sets: AROM;Strengthening;Both;20 reps;Supine Short Arc Quad: AROM;Strengthening;Both;20 reps;Supine Heel Slides: AROM;Strengthening;Both;20 reps;Supine Hip ABduction/ADduction: AROM;Strengthening;Both;20 reps;Supine Straight Leg Raises: AROM;Strengthening;Both;20 reps;Supine Hip Flexion/Marching: AROM;Strengthening;Both;20 reps;Supine    General Comments        Pertinent Vitals/Pain Pain Assessment: 0-10 Pain Score: 3  Pain Location: LLE Pain Intervention(s): Limited activity within patient's tolerance;Monitored during session    Home Living                      Prior Function            PT Goals (  current goals can now be found in the care plan section) Progress towards PT goals: Progressing toward goals    Frequency    7X/week      PT Plan      Co-evaluation              AM-PAC PT "6 Clicks" Mobility   Outcome Measure  Help needed turning from your back to your side while in a flat bed without using bedrails?: None Help needed moving from lying on your back to sitting on the side of a flat bed without using bedrails?: None Help needed moving to and from a bed to a chair (including a wheelchair)?: A Little Help needed standing up from a chair using your arms (e.g., wheelchair or bedside chair)?: A Little Help needed to walk in hospital room?: A Little Help needed climbing 3-5 steps with a railing? : Total 6 Click Score: 18    End of  Session   Activity Tolerance: Patient tolerated treatment well Patient left: with bed alarm set;with family/visitor present Nurse Communication: Mobility status;Weight bearing status PT Visit Diagnosis: Unsteadiness on feet (R26.81);Other abnormalities of gait and mobility (R26.89);Difficulty in walking, not elsewhere classified (R26.2)     Time: 1422-1510 PT Time Calculation (min) (ACUTE ONLY): 48 min  Charges:  $Therapeutic Exercise: 23-37 mins                     Gwenlyn Saran, PT, DPT 03/01/20, 4:21 PM

## 2020-03-01 NOTE — Progress Notes (Signed)
Dressing intact.  Wound vac stable. Toes well perfused. Dr Luana Shu to f/u tomorrow.   C/W NWB to left foot. Will need home health for wound vac changes m/w/f.

## 2020-03-01 NOTE — Consult Note (Signed)
[facility] Nurse Consult Note: Reason for Consult:VAC dressing to be applied Wednesday per Dr Luana Shu.  Will follow.  Domenic Moras MSN, RN, FNP-BC CWON Wound, Ostomy, Continence Nurse Pager [phone]

## 2020-03-01 NOTE — Progress Notes (Signed)
Pharmacy Antibiotic Note  [name]. is a 64 y.o. male admitted on 02/23/2020 with sepsis and cellulitis.  Pharmacy consulted for Unasyn dosing. Zosyn was changed to Unasyn today. The last dose of Zosyn was this morning at 0818.  Day 8 IV abx    Plan: Will order Unasyn IV 3g q6h .  Monitor renal function closely.  Height: 6\' 4"  (193 cm) Weight: (!) 142.9 kg (315 lb) IBW/kg (Calculated) : 86.8  Temp (24hrs), Avg:97.7 F (36.5 C), Min:96.9 F (36.1 C), Max:98.1 F (36.7 C)  Recent Labs  Lab 02/23/20 1454 02/24/20 0429 02/25/20 0455 02/26/20 0427 02/27/20 0413 03/01/20 0349  WBC  --  14.3* 11.1*  --  6.7 7.5  CREATININE  --  1.32* 1.06 0.97  --  0.99  LATICACIDVEN 1.5  --   --   --   --   --     Estimated Creatinine Clearance: 114.9 mL/min (by C-G formula based on SCr of 0.99 mg/dL).    No Known Allergies  Antimicrobials this admission: vancomcyin 10/26 >> 10/28 zosyn 10/26 >> 11/2 Linezolid 10/28>>  Unasyn 11/2 >>  Microbiology results: 10/28 Left Foot Cx: MODERATE GROUP B STREP(S.AGALACTIAE); MRSA 10/26 Bcx: NG TD 10/27 MRSA PCR: negative   Thank you for allowing pharmacy to be a part of this patient's care.  Rowland Lathe, PharmD Clinical Pharmacist 03/01/2020 2:37 PM

## 2020-03-01 NOTE — Progress Notes (Signed)
ID Pt doing better No pain   Patient Vitals for the past 24 hrs:  BP Temp Temp src Pulse Resp SpO2  03/01/20 1211 140/76 97.6 F (36.4 C) Oral 61 18 98 %  03/01/20 0727 134/80 98 F (36.7 C) Oral 67 17 97 %  03/01/20 0318 128/77 97.9 F (36.6 C) Oral 70 17 98 %  02/29/20 2339 114/81 98 F (36.7 C) Oral 71 19 96 %  02/29/20 2244 130/80 97.7 F (36.5 C) Oral 77 18 97 %  02/29/20 2139 (!) 148/75 98.1 F (36.7 C) Oral 79 18 97 %  02/29/20 2045 132/73 97.9 F (36.6 C) Oral 85 18 97 %  02/29/20 1937 (!) 150/89 97.6 F (36.4 C) Oral 80 16 97 %  02/29/20 1830 -- (!) 97 F (36.1 C) -- 75 15 100 %  02/29/20 1826 136/72 -- -- 81 14 98 %  02/29/20 1815 -- -- -- 76 19 98 %  02/29/20 1811 125/61 -- -- 75 12 97 %  02/29/20 1808 -- (!) 96.9 F (36.1 C) -- 81 (!) 9 95 %   O/e awak and alert Left foot surgical wound covered with wound vac  02/29/20           CBC Latest Ref Rng & Units 03/01/2020 02/27/2020 02/25/2020  WBC 4.0 - 10.5 K/uL 7.5 6.7 11.1(H)  Hemoglobin 13.0 - 17.0 g/dL 12.1(L) 12.3(L) 11.5(L)  Hematocrit 39 - 52 % 37.3(L) 38.1(L) 35.1(L)  Platelets 150 - 400 K/uL 269 206 146(L)    CMP Latest Ref Rng & Units 03/01/2020 02/26/2020 02/25/2020  Glucose 70 - 99 mg/dL 183(H) 344(H) 407(H)  BUN 8 - 23 mg/dL 13 22 26(H)  Creatinine 0.61 - 1.24 mg/dL 0.99 0.97 1.06  Sodium 135 - 145 mmol/L 139 134(L) 133(L)  Potassium 3.5 - 5.1 mmol/L 4.8 4.5 4.3  Chloride 98 - 111 mmol/L 102 102 100  CO2 22 - 32 mmol/L 29 23 26   Calcium 8.9 - 10.3 mg/dL 8.6(L) 7.8(L) 7.7(L)  Total Protein 6.5 - 8.1 g/dL - - -  Total Bilirubin 0.3 - 1.2 mg/dL - - -  Alkaline Phos 38 - 126 U/L - - -  AST 15 - 41 U/L - - -  ALT 0 - 44 U/L - - -   THIRD RAY, LEFT FOOT; AMPUTATION:  - SKIN AND UNDERLYING SOFT TISSUE WITH ABSCESS FORMATION AND AREAS OF  NECROSIS.  - ACUTE OSTEOMYELITIS OF UNDERLYING BONE.  - INFLAMED VIABLE SOFT TISSUE AND UNREMARKABLE ARTICULAR BONE AT  RESECTION MARGIN.  -  SEPARATE FRAGMENT OF BONE (DISTAL THIRD METATARSAL) WITH FOCAL ACUTE  OSTEOMYELITIS; INKED CUT RESECTION MARGIN IS NEGATIVE FOR OSTEOMYELITIS ?   Impression/recommendation Left foot infection in a patient with diabetes mellitus/neuropathy and peripheral artery disease. Status post ray excision of the third toe. MRSA /GBS cultured Acute osteo on pathology Margin clear Had further debridement yesterday 02/29/20 and has wound vac   PAD  Status post angioplasty Dc zosyn Continue inezolid, DC zosyn But linezolid cannot be given for a long time Will discuss with Podiatrist and if he needs Iv antibiotic will then switch to vancomycin   Diabetes mellitus.  Poorly controlled.  Currently on insulin.  AKI.  resolved  CAD status post CABG Recent stent placement to the RCA.  On Plavix and aspirin. Hyperlipidemia on atorvastatin Hypertension on metoprolol. Discussed the management with patient and his wife

## 2020-03-01 NOTE — Progress Notes (Addendum)
PROGRESS NOTE    [name].  BMW:[id number] DOB: [birth year] DOA: 02/23/2020 PCP: [name], MD    Brief Narrative:   [name] is a 64 y.o. male with medical history significant for peripheral artery disease on chronic Eliquis therapy, coronary artery disease status post PCI with drug-eluting stent to the mid RCA on 02/03/2020, type 2 diabetes mellitus, hypertension, obstructive sleep apnea compliant on home nocturnal CPAP, who is admitted to [facility] on 02/23/2020 with severe sepsis due to left lower extremity cellulitis after presenting from home to [facility] Emergency Department complaining of left lower extremity erythema.  The patient confirms that he underwent coronary angiography on 02/03/2020 which revealed 90% stenosis of the mid RCA which time he underwent PCI with drug-eluting stent placement to the mid RCA.  Per my review of the ensuing cardiology note, the patient is to remain on on dual antiplatelet therapy without interruption for the next 1 year.  Denies any ensuing chest pain, shortness of breath, palpitations, diaphoresis, dizziness, presyncope, or syncope  10/29-BG levels high in 300's. 10/30-no overnight issues. BG 200's. 11/2-s/p Lt foot I /D and wound vac application  Consultants:    podiatry, vascular surgery, ID  Procedures:   Antimicrobials:   zosyn  Vancomycin-d/c'd Linozolid  Subjective: +bm. C/o his frequency of pain med not covering in between. Offered iv pain med but wants to "wait on it" . Prefers not getting them  Objective: Vitals:   02/29/20 2244 02/29/20 2339 03/01/20 0318 03/01/20 0727  BP: 130/80 114/81 128/77 134/80  Pulse: 77 71 70 67  Resp: 18 19 17 17   Temp: 97.7 F (36.5 C) 98 F (36.7 C) 97.9 F (36.6 C) 98 F (36.7 C)  TempSrc: Oral Oral Oral Oral  SpO2: 97% 96% 98% 97%  Weight:      Height:        Intake/Output Summary (Last 24 hours) at 03/01/2020 0807 Last  data filed at 03/01/2020 0600 Gross per 24 hour  Intake 170 ml  Output 1255 ml  Net -1085 ml   Filed Weights   02/26/20 0508 02/27/20 0500 02/28/20 0642  Weight: (!) 141.2 kg (!) 141.3 kg (!) 142.9 kg    Examination: Calm, comfortable wife at bedside Clear to auscultation no wheeze rhonchi's Regular S1-S2 no murmurs or gallops Positive bowel sounds soft nontender nondistended Bilateral edema, left foot dressing in place, regression of the erythema Alert oriented x3, grossly intact Mood and affect stable and appropriate    Data Reviewed: I have personally reviewed following labs and imaging studies  CBC: Recent Labs  Lab 02/23/20 1058 02/24/20 0429 02/25/20 0455 02/27/20 0413 03/01/20 0349  WBC 16.3* 14.3* 11.1* 6.7 7.5  NEUTROABS 14.1* 12.0*  --   --   --   HGB 12.8* 11.4* 11.5* 12.3* 12.1*  HCT 38.9* 34.0* 35.1* 38.1* 37.3*  MCV 92.6 91.9 92.9 92.5 92.6  PLT 161 155 146* 206 272   Basic Metabolic Panel: Recent Labs  Lab 02/23/20 1058 02/24/20 0429 02/25/20 0455 02/26/20 0427 03/01/20 0349  NA 131* 130* 133* 134* 139  K 4.6 4.1 4.3 4.5 4.8  CL 96* 98 100 102 102  CO2 25 24 26 23 29   GLUCOSE 406* 387* 407* 344* 183*  BUN 31* 33* 26* 22 13  CREATININE 1.34* 1.32* 1.06 0.97 0.99  CALCIUM 8.4* 7.8* 7.7* 7.8* 8.6*  MG 1.8 1.8  --   --   --    GFR: Estimated  Creatinine Clearance: 114.9 mL/min (by C-G formula based on SCr of 0.99 mg/dL). Liver Function Tests: Recent Labs  Lab 02/23/20 1058 02/24/20 0429  AST 17 15  ALT 19 16  ALKPHOS 83 75  BILITOT 0.9 0.7  PROT 6.7 5.8*  ALBUMIN 3.1* 2.6*   No results for input(s): LIPASE, AMYLASE in the last 168 hours. No results for input(s): AMMONIA in the last 168 hours. Coagulation Profile: Recent Labs  Lab 02/24/20 0429  INR 1.3*   Cardiac Enzymes: No results for input(s): CKTOTAL, CKMB, CKMBINDEX, TROPONINI in the last 168 hours. BNP (last 3 results) No results for input(s): PROBNP in the last 8760  hours. HbA1C: No results for input(s): HGBA1C in the last 72 hours. CBG: Recent Labs  Lab 02/29/20 0801 02/29/20 1130 02/29/20 1818 02/29/20 2241 03/01/20 0725  GLUCAP 189* 143* 126* 189* 147*   Lipid Profile: No results for input(s): CHOL, HDL, LDLCALC, TRIG, CHOLHDL, LDLDIRECT in the last 72 hours. Thyroid Function Tests: No results for input(s): TSH, T4TOTAL, FREET4, T3FREE, THYROIDAB in the last 72 hours. Anemia Panel: No results for input(s): VITAMINB12, FOLATE, FERRITIN, TIBC, IRON, RETICCTPCT in the last 72 hours. Sepsis Labs: Recent Labs  Lab 02/23/20 1058 02/23/20 1454  LATICACIDVEN 2.1* 1.5    Recent Results (from the past 240 hour(s))  Culture, blood (routine x 2)     Status: None   Collection Time: 02/23/20 10:58 AM   Specimen: BLOOD  Result Value Ref Range Status   Specimen Description BLOOD BLOOD RIGHT FOREARM  Final   Special Requests   Final    BOTTLES DRAWN AEROBIC AND ANAEROBIC Blood Culture adequate volume   Culture   Final    NO GROWTH 5 DAYS Performed at [facility], [address] [place], [place] [address]    Report Status 02/28/2020 FINAL  Final  Culture, blood (routine x 2)     Status: None   Collection Time: 02/23/20 10:58 AM   Specimen: BLOOD  Result Value Ref Range Status   Specimen Description BLOOD Blood Culture adequate volume  Final   Special Requests   Final    BOTTLES DRAWN AEROBIC AND ANAEROBIC LEFT ANTECUBITAL   Culture   Final    NO GROWTH 5 DAYS Performed at [facility], [address] [place], [place] [address]    Report Status 02/28/2020 FINAL  Final  Respiratory Panel by RT PCR (Flu A&B, Covid) - Nasopharyngeal Swab     Status: None   Collection Time: 02/23/20 11:59 AM   Specimen: Nasopharyngeal Swab  Result Value Ref Range Status   SARS Coronavirus 2 by RT PCR NEGATIVE NEGATIVE Final    Comment: (NOTE) SARS-CoV-2 target nucleic acids are NOT DETECTED.  The SARS-CoV-2 RNA is generally  detectable in upper respiratoy specimens during the acute phase of infection. The lowest concentration of SARS-CoV-2 viral copies this assay can detect is 131 copies/mL. A negative result does not preclude SARS-Cov-2 infection and should not be used as the sole basis for treatment or other patient management decisions. A negative result may occur with  improper specimen collection/handling, submission of specimen other than nasopharyngeal swab, presence of viral mutation(s) within the areas targeted by this assay, and inadequate number of viral copies (<131 copies/mL). A negative result must be combined with clinical observations, patient history, and epidemiological information. The expected result is Negative.  Fact Sheet for Patients:  PinkCheek.be  Fact Sheet for Healthcare Providers:  GravelBags.it  This test is no t yet approved  or cleared by the [place] and  has been authorized for detection and/or diagnosis of SARS-CoV-2 by FDA under an Emergency Use Authorization (EUA). This EUA will remain  in effect (meaning this test can be used) for the duration of the COVID-19 declaration under Section 564(b)(1) of the Act, 21 U.S.C. section 360bbb-3(b)(1), unless the authorization is terminated or revoked sooner.     Influenza A by PCR NEGATIVE NEGATIVE Final   Influenza B by PCR NEGATIVE NEGATIVE Final    Comment: (NOTE) The Xpert Xpress SARS-CoV-2/FLU/RSV assay is intended as an aid in  the diagnosis of influenza from Nasopharyngeal swab specimens and  should not be used as a sole basis for treatment. Nasal washings and  aspirates are unacceptable for Xpert Xpress SARS-CoV-2/FLU/RSV  testing.  Fact Sheet for Patients: PinkCheek.be  Fact Sheet for Healthcare Providers: GravelBags.it  This test is not yet approved or cleared by the [place] FDA and    has been authorized for detection and/or diagnosis of SARS-CoV-2 by  FDA under an Emergency Use Authorization (EUA). This EUA will remain  in effect (meaning this test can be used) for the duration of the  Covid-19 declaration under Section 564(b)(1) of the Act, 21  U.S.C. section 360bbb-3(b)(1), unless the authorization is  terminated or revoked. Performed at [facility], [address], [place], [place] [address]   Surgical pcr screen     Status: None   Collection Time: 02/24/20 12:25 PM   Specimen: Nasal Mucosa; Nasal Swab  Result Value Ref Range Status   MRSA, PCR NEGATIVE NEGATIVE Final   Staphylococcus aureus NEGATIVE NEGATIVE Final    Comment: (NOTE) The Xpert SA Assay (FDA approved for NASAL specimens in patients 64 years of age and older), is one component of a comprehensive surveillance program. It is not intended to diagnose infection nor to guide or monitor treatment. Performed at [facility], [address] [place], [place] [address]   Aerobic/Anaerobic Culture (surgical/deep wound)     Status: None (Preliminary result)   Collection Time: 02/24/20  5:44 PM   Specimen: PATH Other; Body Fluid  Result Value Ref Range Status   Specimen Description   Final    WOUND 3RD RAY LEFT FOOT SAMPLE A Performed at [facility], [address] [place], [place] [address]    Special Requests   Final    NONE Performed at [facility], [place], [place] [address]    Gram Stain   Final    NO WBC SEEN ABUNDANT GRAM POSITIVE COCCI FEW GRAM NEGATIVE DIPLOCOCCI Performed at [facility], [place] [address], [place], [place] [address]    Culture   Final    MODERATE GROUP B STREP(S.AGALACTIAE)ISOLATED TESTING AGAINST S. AGALACTIAE NOT ROUTINELY PERFORMED DUE TO PREDICTABILITY OF AMP/PEN/VAN SUSCEPTIBILITY. RARE METHICILLIN RESISTANT STAPHYLOCOCCUS AUREUS NO ANAEROBES ISOLATED; CULTURE IN PROGRESS FOR 5 DAYS    Report Status  PENDING  Incomplete   Organism ID, Bacteria METHICILLIN RESISTANT STAPHYLOCOCCUS AUREUS  Final      Susceptibility   Methicillin resistant staphylococcus aureus - MIC*    CIPROFLOXACIN >=8 RESISTANT Resistant     ERYTHROMYCIN <=0.25 SENSITIVE Sensitive     GENTAMICIN <=0.5 SENSITIVE Sensitive     OXACILLIN >=4 RESISTANT Resistant     TETRACYCLINE <=1 SENSITIVE Sensitive     VANCOMYCIN 1 SENSITIVE Sensitive     TRIMETH/SULFA 20 SENSITIVE Sensitive     CLINDAMYCIN <=0.25 SENSITIVE Sensitive  RIFAMPIN <=0.5 SENSITIVE Sensitive     Inducible Clindamycin NEGATIVE Sensitive     * RARE METHICILLIN RESISTANT STAPHYLOCOCCUS AUREUS  Surgical pcr screen     Status: None   Collection Time: 02/28/20  6:45 PM   Specimen: Nasal Mucosa; Nasal Swab  Result Value Ref Range Status   MRSA, PCR NEGATIVE NEGATIVE Final   Staphylococcus aureus NEGATIVE NEGATIVE Final    Comment: (NOTE) The Xpert SA Assay (FDA approved for NASAL specimens in patients 71 years of age and older), is one component of a comprehensive surveillance program. It is not intended to diagnose infection nor to guide or monitor treatment. Performed at [facility], [address], [place], [place] [address]          Radiology Studies: No results found.      Scheduled Meds: . acidophilus  1 capsule Oral Daily  . apixaban  5 mg Oral BID  . aspirin EC  81 mg Oral Daily  . atorvastatin  40 mg Oral QHS  . clopidogrel  75 mg Oral Q breakfast  . insulin aspart  0-15 Units Subcutaneous TID WC  . insulin aspart  15 Units Subcutaneous TID WC  . insulin glargine  40 Units Subcutaneous BID  . linezolid  600 mg Oral Q12H  . metoprolol tartrate  50 mg Oral BID  .  morphine injection  1 mg Intravenous Once  . pramipexole  1 mg Oral QHS  . primidone  250 mg Oral BID  . tamsulosin  0.4 mg Oral Daily   Continuous Infusions: . piperacillin-tazobactam (ZOSYN)  IV 3.375 g (03/01/20 0309)    Assessment & Plan:     Principal Problem:   Cellulitis of left lower extremity Active Problems:   Diabetes mellitus type 2 in obese (HCC)   PAD (peripheral artery disease) (HCC)   CAD (coronary artery disease)   Severe sepsis (HCC)   AKI (acute kidney injury) ([place])   #)  sepsis due to cellulitis/left third MTPJ ulceration with gas gangrene:  plain films and MRI showing evidence of subcutaneous swelling consistent with cellulitis in the absence of any evidence of acute osteomyelitis.   Status post left partial third ray amputation by Dr. Luana Shu on 10/27 10/29-status post angiogram by Dr. Lucky Cowboy on 10/28.  Status post angioplasty of the left posterior tibial artery with drug-coated angioplasty balloon, angioplasty of the left popliteal artery and proximal tibioperoneal trunk also 11/2-status post I&D with wound VAC application on 14/4  Wound cultures growing strep agalactiae and staph aureus-MRSA ID following -continue with Zosyn and linezolid Added probiotics Will change Norco to every 4 hours from every 6    Per podiatry patient likely will need to go back to the OR for further debridement and washout and potential wound VAC application. Patient will be placed on n.p.o. at 8 AM on 02/29/2020 for surgical procedure at some point in the afternoon/evening Patient should be nonweightbearing for the most part of all times to the left lower extremity but can use heel contact and surgical shoe when performing transfers. PT consult Patient should try to stay off foot as much as possible at this time Wound cultures growing strep agalactiae and staph aureurs, MRSA ID following-will continue with linezolid and Zosyn    #) Acute kidney injury: Presenting creatinine noted to be 1.34 relative to most recent prior value of 0.823 when checked on 02/03/2020.   Likely due to intravascular depletion/severe sepsis.  Likely exacerbated with losartan. 1/2 -continue to hold ARB for  now  Creatinine remained stable at 0.99 today   Continue to monitor      #) Obstructive sleep apnea:  On CPAP at home and is compliant   #) Type 2 diabetes mellitus with hyperglycemia-uncontrolled Hemoglobin A1c 9.7 Discussed with patient importance of controlling his diabetes as outpatient Likely infection contributing to elevated blood sugars 2.   11/2 -blood glucose levels improved  We will continue with Lantus 40 twice daily  Continue with current NovoLog with meals  R-ISS     #) Peripheral artery disease/CAD: Patient had coronary cath on 02/03/2020 status post DES to mid RCA S/p angiogram on 10/28 Status post angiogram on 02/25/2020-please see #1.  Status post angioplasty. Continue with Plavix and Eliquis per cardiology without interruption for 1 year  Continue statins and beta-blockers  Continue with aspirin    #) BPH: On Flomax      DVT prophylaxis: Eliquis Code Status: Full Family Communication: None at bedside  Status is: Inpatient  Remains inpatient appropriate because:IV treatments appropriate due to intensity of illness or inability to take PO   Dispo: The patient is from: Home              Anticipated d/c is to: Home              Anticipated d/c date is: 3 days              Patient currently is not medically stable to d/c.  On iv abx. Need to f/u ID and podiatry for any further debridment.          LOS: 7 days   Time spent: 35 min with >50% on coc   Nolberto Hanlon, MD [facility] Pager [phone]  If 7PM-7AM, please contact night-coverage [email or web address] Password [facility] 03/01/2020, 8:07 AM

## 2020-03-02 DIAGNOSIS — A4902 Methicillin resistant Staphylococcus aureus infection, unspecified site: Secondary | ICD-10-CM

## 2020-03-02 LAB — GLUCOSE, CAPILLARY
Glucose-Capillary: 129 mg/dL — ABNORMAL HIGH (ref 70–99)
Glucose-Capillary: 210 mg/dL — ABNORMAL HIGH (ref 70–99)
Glucose-Capillary: 120 mg/dL — ABNORMAL HIGH (ref 70–99)
Glucose-Capillary: 200 mg/dL — ABNORMAL HIGH (ref 70–99)

## 2020-03-02 MED ORDER — LINEZOLID 600 MG PO TABS: 600.0000 mg | ORAL_TABLET | Freq: Two times a day (BID) | ORAL | 0 refills | Status: AC

## 2020-03-02 MED ORDER — ISOSORBIDE MONONITRATE ER 30 MG PO TB24
60.0000 mg | ORAL_TABLET | Freq: Two times a day (BID) | ORAL | Status: DC
  Administered 2020-03-02 – 2020-03-03 (×3): 60 mg via ORAL
  Filled 2020-03-02 (×3): qty 2

## 2020-03-02 MED ORDER — ZINC SULFATE 220 (50 ZN) MG PO CAPS
220.0000 mg | ORAL_CAPSULE | Freq: Every day | ORAL | Status: DC
  Administered 2020-03-03: 220 mg via ORAL
  Filled 2020-03-02: qty 1

## 2020-03-02 MED ORDER — LOSARTAN POTASSIUM 50 MG PO TABS
100.0000 mg | ORAL_TABLET | Freq: Every day | ORAL | Status: DC
  Administered 2020-03-02 – 2020-03-03 (×2): 100 mg via ORAL
  Filled 2020-03-02 (×2): qty 2

## 2020-03-02 MED ORDER — ADULT MULTIVITAMIN W/MINERALS CH
1.0000 | ORAL_TABLET | Freq: Every day | ORAL | Status: DC
  Administered 2020-03-02 – 2020-03-03 (×2): 1 via ORAL
  Filled 2020-03-02 (×2): qty 1

## 2020-03-02 MED ORDER — METFORMIN HCL 500 MG PO TABS
1000.0000 mg | ORAL_TABLET | Freq: Two times a day (BID) | ORAL | Status: DC
  Administered 2020-03-02 – 2020-03-03 (×2): 1000 mg via ORAL
  Filled 2020-03-02 (×2): qty 2

## 2020-03-02 MED ORDER — FERROUS SULFATE 325 (65 FE) MG PO TABS
325.0000 mg | ORAL_TABLET | Freq: Every day | ORAL | Status: DC
  Administered 2020-03-03: 325 mg via ORAL
  Filled 2020-03-02: qty 1

## 2020-03-02 MED ORDER — FUROSEMIDE 20 MG PO TABS: 20.0000 mg | ORAL_TABLET | Freq: Every day | ORAL | Status: DC | PRN

## 2020-03-02 MED ORDER — AMOXICILLIN-POT CLAVULANATE 875-125 MG PO TABS: 1.0000 | ORAL_TABLET | Freq: Two times a day (BID) | ORAL | 0 refills | Status: AC

## 2020-03-02 MED ORDER — INSULIN ASPART 100 UNIT/ML ~~LOC~~ SOLN
10.0000 [IU] | Freq: Three times a day (TID) | SUBCUTANEOUS | Status: DC
  Administered 2020-03-02 – 2020-03-03 (×3): 10 [IU] via SUBCUTANEOUS
  Filled 2020-03-02 (×3): qty 1

## 2020-03-02 NOTE — Progress Notes (Signed)
ID Pt doing better No pain   Patient Vitals for the past 24 hrs:  BP Temp Temp src Pulse Resp SpO2 Weight  03/02/20 0754 (!) 143/76 98.2 F (36.8 C) Oral 64 18 98 % --  03/02/20 0500 -- -- -- -- -- -- (!) 143.2 kg  03/02/20 0444 (!) 142/68 98.4 F (36.9 C) Oral 66 17 99 % --  03/01/20 2339 130/78 98 F (36.7 C) Oral 62 18 99 % --  03/01/20 2058 (!) 154/81 98 F (36.7 C) Oral 72 18 98 % --  03/01/20 1612 139/78 98.6 F (37 C) Oral 79 18 100 % --  03/01/20 1211 140/76 97.6 F (36.4 C) Oral 61 18 98 % --   O/e awak and alert Left foot surgical wound covered with wound vac  03/02/20       02/29/20           CBC Latest Ref Rng & Units 03/01/2020 02/27/2020 02/25/2020  WBC 4.0 - 10.5 K/uL 7.5 6.7 11.1(H)  Hemoglobin 13.0 - 17.0 g/dL 12.1(L) 12.3(L) 11.5(L)  Hematocrit 39 - 52 % 37.3(L) 38.1(L) 35.1(L)  Platelets 150 - 400 K/uL 269 206 146(L)    CMP Latest Ref Rng & Units 03/01/2020 02/26/2020 02/25/2020  Glucose 70 - 99 mg/dL 183(H) 344(H) 407(H)  BUN 8 - 23 mg/dL 13 22 26(H)  Creatinine 0.61 - 1.24 mg/dL 0.99 0.97 1.06  Sodium 135 - 145 mmol/L 139 134(L) 133(L)  Potassium 3.5 - 5.1 mmol/L 4.8 4.5 4.3  Chloride 98 - 111 mmol/L 102 102 100  CO2 22 - 32 mmol/L 29 23 26   Calcium 8.9 - 10.3 mg/dL 8.6(L) 7.8(L) 7.7(L)  Total Protein 6.5 - 8.1 g/dL - - -  Total Bilirubin 0.3 - 1.2 mg/dL - - -  Alkaline Phos 38 - 126 U/L - - -  AST 15 - 41 U/L - - -  ALT 0 - 44 U/L - - -   THIRD RAY, LEFT FOOT; AMPUTATION:  - SKIN AND UNDERLYING SOFT TISSUE WITH ABSCESS FORMATION AND AREAS OF  NECROSIS.  - ACUTE OSTEOMYELITIS OF UNDERLYING BONE.  - INFLAMED VIABLE SOFT TISSUE AND UNREMARKABLE ARTICULAR BONE AT  RESECTION MARGIN.  - SEPARATE FRAGMENT OF BONE (DISTAL THIRD METATARSAL) WITH FOCAL ACUTE  OSTEOMYELITIS; INKED CUT RESECTION MARGIN IS NEGATIVE FOR OSTEOMYELITIS ?   Impression/recommendation Left foot infection in a patient with diabetes mellitus/neuropathy and  peripheral artery disease. Status post ray excision of the third toe. MRSA /GBS cultured Acute osteo on pathology Margin clear Had further debridement  02/29/20 and has wound vac   PAD  Status post angioplasty As we are treating like deep tissue infection and not residual bone infection can treat with linezolid + augmentin for a total of 2 weeks on discharge   Diabetes mellitus.  Poorly controlled.  Currently on insulin.  AKI.  resolved  CAD status post CABG Recent stent placement to the RCA.  On Plavix and aspirin. Hyperlipidemia on atorvastatin Hypertension on metoprolol. Discussed the management with patient and his wife Discussed with Dr.Baker

## 2020-03-02 NOTE — Progress Notes (Signed)
End of Shift:   No acute changes over night. Patient is in bed and sleeping at change of shift. NPWT on left foot. Gave report to Bet, R.N.

## 2020-03-02 NOTE — Consult Note (Signed)
[facility] Nurse Consult Note: Reason for Consult: First post op dressing change.  Dr Luana Shu at bedside.  Wound type:surgical amputation Left foot wound debridement to the level of tendon with removal of all nonviable necrotic tissues Pressure Injury POA: /NA Measurement: 14 cm x 3.2 cm x 3 cm  Wound bed:Red, bleeds with care.  Drainage (amount, consistency, odor) moderate bleeding with NPWT VAC application Periwound: wound located between toes.  Amputation of third metatarsal.  SUTURE LINE to dorsal foot at amputation site.  Stitches are present and edges are not approximated.  Patient and spouse are cautioned that NO PRESSURE should be placed on the foot to protect this suture line. I have placed a barrier ring over the plantar suture line and over the dorsal foot. Where skin is denuded.  Dressing procedure/placement/frequency: Cleanse wound to left foot with NS.  Apply barrier rings to dorsal and plantar foot and between toes to promote seal.  Fill with black foam. Trac pad bridgedto dorsal foot. Covered with drape. Seal achieved with some reinforcement.  Change Mon/wed Fri Will not follow at this time.  Please re-consult if needed.  Domenic Moras MSN, RN, FNP-BC CWON Wound, Ostomy, Continence Nurse Pager [phone]

## 2020-03-02 NOTE — Progress Notes (Signed)
Pharmacy - Antimicrobial Stewardship  Plan for patient to be on amoxicillin/clavulonate and linezolid at discharge x 14d.  Ran linezolid through insurance and to be ~$37.  Spoke with patient and wife and they can afford.  Patient also educated on how to take the antibiotics, side effects and interactions. QUestions answered.  Prescriptions for antibiotics sent to [facility] (confirmed with patient).  They will order linezolid so will have in stock if discharged tomorrow  Doreene Eland, PharmD, BCPS.   Work Cell: [phone] 03/02/2020 4:16 PM

## 2020-03-02 NOTE — TOC Initial Note (Signed)
Transition of Care (TOC) - Initial/Assessment Note    Patient Details  Name: [name]. MRN: [id number] Date of Birth: [birth year]  Transition of Care [facility]) CM/SW Contact:    Shelbie Ammons, RN Phone Number: 03/02/2020, 1:01 PM  Clinical Narrative:     RNCM spoke with patient and fiance [name] by phone. Patient was admitted to hospital due cellulitis of his lower extremity which has resulted in 3rd toe amputation and 2 surgeries. Patient is normally independent at home. Both patient and fiance are agreeable to home health being arranged and are agreeable to whoever can accept his insurance. They also understand he will be set up with a NPWT device to go home with. He reports he has already ordered a knee scooter.  RNCM reached out to [place] with [facility] and she accepted referral for home health. They can start patient on Saturday 11/6 and MD was made aware.  RNCM reached out to [facility] with [employer] for NPWT.               Expected Discharge Plan: [place] Barriers to Discharge: Continued Medical Work up   Patient Goals and CMS Choice        Expected Discharge Plan and Services Expected Discharge Plan: [place] Choice: [place] arrangements for the past 2 months: Single Family Home                 DME Arranged: Negative pressure wound device DME Agency: [employer] Date DME Agency Contacted: 03/02/20 Time DME Agency Contacted: 62 Representative spoke with at DME Agency: [place] Arranged: RN, PT, OT [place] Agency: [facility] Date [place]: 03/02/20 Time [place]: 76 Representative spoke with at [name]: [place] Arrangements/Services Living arrangements for the past 2 months: [place] with:: Significant Other Patient language and need for interpreter reviewed:: Yes Do you feel safe going back to the place where you live?: Yes      Need  for Family Participation in Patient Care: Yes (Comment) Care giver support system in place?: Yes (comment)   Criminal Activity/Legal Involvement Pertinent to Current Situation/Hospitalization: No - Comment as needed  Activities of Daily Living Home Assistive Devices/Equipment: Eyeglasses, CBG Meter, Dentures (specify type) ADL Screening (condition at time of admission) Patient's cognitive ability adequate to safely complete daily activities?: Yes Is the patient deaf or have difficulty hearing?: No Does the patient have difficulty seeing, even when wearing glasses/contacts?: No Does the patient have difficulty concentrating, remembering, or making decisions?: No Patient able to express need for assistance with ADLs?: Yes Does the patient have difficulty dressing or bathing?: No Independently performs ADLs?: Yes (appropriate for developmental age) Does the patient have difficulty walking or climbing stairs?: No Weakness of Legs: None Weakness of Arms/Hands: None  Permission Sought/Granted                  Emotional Assessment Appearance:: Appears stated age Attitude/Demeanor/Rapport: Engaged Affect (typically observed): Appropriate Orientation: : Oriented to Self, Oriented to Place, Oriented to  Time, Oriented to Situation Alcohol / Substance Use: Not Applicable Psych Involvement: No (comment)  Admission diagnosis:  Cellulitis of left lower extremity [L03.116] Patient Active Problem List   Diagnosis Date Noted  . Severe sepsis ([place]) 02/24/2020  . AKI (acute kidney injury) ([place]) 02/24/2020  . Cellulitis of left lower extremity 02/23/2020  . CAD (coronary artery disease)  02/02/2020  . Diabetes mellitus type 2 in obese ([place]) 02/13/2016  . Essential hypertension, benign 02/13/2016  . PAD (peripheral artery disease) ([place]) 02/13/2016  . Pressure injury of skin 01/25/2016   PCP:  [name], MD Pharmacy:   [place], [address] [address] Phone: [phone] Fax: [place], [place] [address] Phone: [phone] Fax: [phone]     Social Determinants of Health (SDOH) Interventions    Readmission Risk Interventions No flowsheet data found.

## 2020-03-02 NOTE — Discharge Instructions (Signed)
Podiatry discharge instructions: 1.  Keep dressings clean, dry, and intact and have wound care team change dressings hopefully every Monday, Wednesday, Friday consisting of wound VAC changes. 2.  Take antibiotics as prescribed until gone and continue close follow-up with infectious disease. 3.  Recommend continued close follow-up with vascular surgery as well due to history of peripheral vascular disease and since you have had recent revascularization procedure to the left lower extremity. 4.  Continue nonweightbearing at all times to your surgical extremity in surgical shoe.  Okay for heel contact only when transferring but otherwise need to be staying off the foot at all times. 5.  Please schedule follow-up appointment for 1 to 2 weeks after discharge date.

## 2020-03-02 NOTE — Progress Notes (Signed)
2 Days Post-Op   Subjective/Chief Complaint: Patient seen.  States he is feeling okay and some better than yesterday.  Patient has minimal pain to his foot overall at this time and is feeling better in general.  Patient is trying to maintain nonweightbearing on the foot since procedure.   Objective: Vital signs in last 24 hours: Temp:  [97.5 F (36.4 C)-98.6 F (37 C)] 97.5 F (36.4 C) (11/03 1147) Pulse Rate:  [62-79] 66 (11/03 1147) Resp:  [17-18] 18 (11/03 1147) BP: (130-154)/(68-90) 136/90 (11/03 1147) SpO2:  [98 %-100 %] 98 % (11/03 1147) Weight:  [143.2 kg] 143.2 kg (11/03 0500) Last BM Date: 03/02/20  Intake/Output from previous day: 11/02 0701 - 11/03 0700 In: 300 [IV Piggyback:300] Out: 3150 [Urine:3150] Intake/Output this shift: Total I/O In: -  Out: 500 [Urine:500]  Capillary fill time appears to be intact to digits on the left foot.  Appears to still have some dusky changes present around mainly the dorsal incision site with some maceration of tissues present.  Appears to be slightly improved since previous visit.  No purulent drainage noted today.  Plantar incision line appears to be partially dehisced but retention sutures are still intact.  Skin edges appear to be mildly macerated.  Erythema and edema though do appear to be improving all since admission and since procedure. No odor present.  Wound bed appears to be fibrogranular for the most part at this time.  Hemostasis appears to be well achieved.          Lab Results:  Recent Labs    03/01/20 0349  WBC 7.5  HGB 12.1*  HCT 37.3*  PLT 269   BMET Recent Labs    03/01/20 0349  NA 139  K 4.8  CL 102  CO2 29  GLUCOSE 183*  BUN 13  CREATININE 0.99  CALCIUM 8.6*   PT/INR No results for input(s): LABPROT, INR in the last 72 hours. ABG No results for input(s): PHART, HCO3 in the last 72 hours.  Invalid input(s): PCO2, PO2  Studies/Results: No results  found.  Anti-infectives: Anti-infectives (From admission, onward)   Start     Dose/Rate Route Frequency Ordered Stop   03/01/20 1600  Ampicillin-Sulbactam (UNASYN) 3 g in sodium chloride 0.9 % 100 mL IVPB        3 g 200 mL/hr over 30 Minutes Intravenous Every 6 hours 03/01/20 1442     02/29/20 1708  piperacillin-tazobactam (ZOSYN) 3.375 GM/50ML IVPB       Note to Pharmacy: Wynonia Lawman, [place]   : cabinet override      02/29/20 1708 02/29/20 1711   02/25/20 2200  linezolid (ZYVOX) tablet 600 mg        600 mg Oral Every 12 hours 02/25/20 1739     02/24/20 1748  vancomycin (VANCOCIN) powder  Status:  Discontinued          As needed 02/24/20 1748 02/24/20 1825   02/23/20 2000  vancomycin (VANCOCIN) IVPB 1000 mg/200 mL premix  Status:  Discontinued        1,000 mg 200 mL/hr over 60 Minutes Intravenous Every 12 hours 02/23/20 1602 02/25/20 1739   02/23/20 1800  piperacillin-tazobactam (ZOSYN) IVPB 3.375 g  Status:  Discontinued        3.375 g 12.5 mL/hr over 240 Minutes Intravenous Every 8 hours 02/23/20 1600 03/01/20 1426   02/23/20 1030  vancomycin (VANCOCIN) IVPB 1000 mg/200 mL premix        1,000 mg 200 mL/hr over  60 Minutes Intravenous  Once 02/23/20 1025 02/23/20 1302   02/23/20 1030  piperacillin-tazobactam (ZOSYN) IVPB 3.375 g        3.375 g 100 mL/hr over 30 Minutes Intravenous  Once 02/23/20 1025 02/23/20 1159      Assessment/Plan: s/p Procedure(s): IRRIGATION AND DEBRIDEMENT FOOT (Left) APPLICATION OF WOUND VAC (Left) Assessment: Condition guarded status post ray resection left foot.   Plan:  -Patient seen and examined. -Wound VAC removed and wound examined today.  Appears to be stable at this time.  No further evidence of infection present. -Wound care nurse to apply wound VAC today and to have changes performed almost every other day - Monday, Wednesday, Friday schedule.  Home health orders placed. -Appreciate PT/OT recommendations.  Home health orders placed. -Patient  instructed to maintain nonweightbearing at all times left lower extremity.  He may use the left heel for transfers only in a surgical shoe but really needs to be staying off the foot for the most part at all times. -Appreciate infectious disease recommendations.  Likely to go home on 2 weeks of oral antibiotics consisting of linezolid as well as Augmentin.  Recommend close follow-up. -Appreciate vascular recommendations.  Recommend close follow-up as patient had minimal bleeding during procedure at the dorsal aspect of the foot.  Podiatry team to sign off at this time.  Patient may follow-up in clinic within 1-2 weeks of discharge date.  Reconsult if any further problems arise.    LOS: 8 days    Caroline More, [facility] 03/02/2020

## 2020-03-02 NOTE — Progress Notes (Signed)
Physical Therapy Treatment Patient Details Name: [name]. MRN: [id number] DOB: 1956/02/03 Today's Date: 03/02/2020    History of Present Illness Pt is a 64 y/o M admitted on 02/23/20 for sepsis 2/2 L foot redness & swelling & pt found to have cellulitis. Pt underwent L partial 3rd ray amputation, I&D of L foot on 02/24/20 & now NWB LLE. Pt also underwent Lower Extremity Angiography (Left) on 02/25/20. PMH: PAD, CAD s/p PCI with drug eluting stent to mid RCA on 02/03/20, DM2, HTN, OSA, asthma, depression, gout, HLD, obesity, MI.    PT Comments    Pt supine upon arrival to room and agreeable to therapy.  Pt performed bed-level exercises and continue to perform good amount of reps with adequate control.  Pt then proceeded to perform bed mobility and utilized bedrails in order to come upright.  Pt then proceeded to come upright into standing with use of FWW and without placing weight through LLE.  Pt then returned to seated EOB with nursing staff in room and taking vitals.  All needs met prior to leaving room.  Current discharge plans to HHPT remain appropriate at this time.  Pt will continue to benefit from skilled therapy in order to address deficits listed below.    Follow Up Recommendations  Home health PT;Supervision for mobility/OOB     Equipment Recommendations       Recommendations for Other Services       Precautions / Restrictions Precautions Precautions: Fall Restrictions Weight Bearing Restrictions: Yes RLE Weight Bearing: Weight bearing as tolerated LLE Weight Bearing: Non weight bearing Other Position/Activity Restrictions: Per pt, unable to weight bear on LLE follow surgical procedure last ngiht.    Mobility  Bed Mobility Overal bed mobility: Modified Independent             General bed mobility comments: Pt able to utilize handrails on bed to come upright.  Transfers Overall transfer level: Needs assistance Equipment used: Rolling walker (2  wheeled) Transfers: Sit to/from Stand Sit to Stand: Supervision            Ambulation/Gait                 Stairs             Wheelchair Mobility    Modified Rankin (Stroke Patients Only)       Balance Overall balance assessment: Needs assistance Sitting-balance support: Feet supported;Bilateral upper extremity supported Sitting balance-Leahy Scale: Normal     Standing balance support: Bilateral upper extremity supported;During functional activity Standing balance-Leahy Scale: Fair                              Cognition Arousal/Alertness: Awake/alert Behavior During Therapy: WFL for tasks assessed/performed Overall Cognitive Status: Within Functional Limits for tasks assessed                                        Exercises General Exercises - Lower Extremity Ankle Circles/Pumps: AROM;Strengthening;Both;20 reps;Supine Quad Sets: AROM;Strengthening;Both;20 reps;Supine Gluteal Sets: AROM;Strengthening;Both;20 reps;Supine Short Arc Quad: AROM;Strengthening;Both;20 reps;Supine Heel Slides: AROM;Strengthening;Both;20 reps;Supine Hip ABduction/ADduction: AROM;Strengthening;Both;20 reps;Supine Straight Leg Raises: AROM;Strengthening;Both;20 reps;Supine Hip Flexion/Marching: AROM;Strengthening;Both;20 reps;Supine    General Comments        Pertinent Vitals/Pain Pain Assessment: 0-10 Pain Score: 3  Pain Location: LLE Pain Intervention(s): Limited activity within patient's tolerance;Monitored during session  Home Living                      Prior Function            PT Goals (current goals can now be found in the care plan section) Progress towards PT goals: Progressing toward goals    Frequency    7X/week      PT Plan      Co-evaluation              AM-PAC PT "6 Clicks" Mobility   Outcome Measure  Help needed turning from your back to your side while in a flat bed without using  bedrails?: None Help needed moving from lying on your back to sitting on the side of a flat bed without using bedrails?: None Help needed moving to and from a bed to a chair (including a wheelchair)?: A Little Help needed standing up from a chair using your arms (e.g., wheelchair or bedside chair)?: A Little Help needed to walk in hospital room?: A Little Help needed climbing 3-5 steps with a railing? : Total 6 Click Score: 18    End of Session   Activity Tolerance: Patient tolerated treatment well Patient left: with bed alarm set;with family/visitor present Nurse Communication: Mobility status;Weight bearing status PT Visit Diagnosis: Unsteadiness on feet (R26.81);Other abnormalities of gait and mobility (R26.89);Difficulty in walking, not elsewhere classified (R26.2)     Time: 1833-5825 PT Time Calculation (min) (ACUTE ONLY): 24 min  Charges:  $Therapeutic Exercise: 23-37 mins                       Gwenlyn Saran, PT, DPT 03/02/20, 12:31 PM

## 2020-03-02 NOTE — Progress Notes (Signed)
[facility] [place] [facility] [place] NAME: [name]    MR#:  [id number]  DATE OF BIRTH:  Jan 31, 1956  SUBJECTIVE:   No new complaints. Family in the room. REVIEW OF SYSTEMS:   Review of Systems  Constitutional: Negative for chills, fever and weight loss.  HENT: Negative for ear discharge, ear pain and nosebleeds.   Eyes: Negative for blurred vision, pain and discharge.  Respiratory: Negative for sputum production, shortness of breath, wheezing and stridor.   Cardiovascular: Negative for chest pain, palpitations, orthopnea and PND.  Gastrointestinal: Negative for abdominal pain, diarrhea, nausea and vomiting.  Genitourinary: Negative for frequency and urgency.  Musculoskeletal: Negative for back pain and joint pain.  Neurological: Negative for sensory change, speech change, focal weakness and weakness.  Psychiatric/Behavioral: Negative for depression and hallucinations. The patient is not nervous/anxious.    Tolerating Diet: yes Tolerating PT:   DRUG ALLERGIES:  No Known Allergies  VITALS:  Blood pressure 136/90, pulse 66, temperature (!) 97.5 F (36.4 C), temperature source Oral, resp. rate 18, height 6\' 4"  (1.93 m), weight (!) 143.2 kg, SpO2 98 %.  PHYSICAL EXAMINATION:   Physical Exam  GENERAL:  64 y.o.-year-old patient lying in the bed with no acute distress. obese HEENT: Head atraumatic, normocephalic. Oropharynx and nasopharynx clear.  NECK:  Supple, no jugular venous distention. No thyroid enlargement, no tenderness.  LUNGS: Normal breath sounds bilaterally, no wheezing, rales, rhonchi. No use of accessory muscles of respiration.  CARDIOVASCULAR: S1, S2 normal. No murmurs, rubs, or gallops.  ABDOMEN: Soft, nontender, nondistended. Bowel sounds present. No organomegaly or mass.  EXTREMITIES:       NEUROLOGIC: grossly nonfocalPSYCHIATRIC:  patient is alert and oriented x 3.  SKIN: as above  LABORATORY PANEL:  CBC Recent Labs    Lab 03/01/20 0349  WBC 7.5  HGB 12.1*  HCT 37.3*  PLT 269    Chemistries  Recent Labs  Lab 03/01/20 0349  NA 139  K 4.8  CL 102  CO2 29  GLUCOSE 183*  BUN 13  CREATININE 0.99  CALCIUM 8.6*   Cardiac Enzymes No results for input(s): TROPONINI in the last 168 hours. RADIOLOGY:  No results found. ASSESSMENT AND PLAN:  Alec Woodis a 64 y.o.malewith medical history significant forperipheral artery disease on chronic Eliquis therapy, coronary artery disease status post PCI with drug-eluting stent to the mid RCA on 02/03/2020, type 2 diabetes mellitus, hypertension, obstructive sleep apnea compliant on home nocturnal CPAP,who is admitted on 10/26/2021with severe sepsis due to left lower extremity cellulitisafter presenting from home to  Emergency Department complaining of left lower extremity erythema.  # Sepsis due to cellulitis/left third MTPJ ulceration with gas gangrene:  Diabetic foot/PAD -- MRI showing evidence of subcutaneous swelling consistent with cellulitis in the absence of any evidence of acute osteomyelitis. --Status post left partial third ray amputation by Dr. Luana Wood on 10/27 --10/29-status post angiogram by Dr. Lucky Wood on 10/28. Status post angioplasty of the left posterior tibial artery with drug-coated angioplasty balloon, angioplasty of the left popliteal artery and proximal tibioperoneal trunk also --10/30--Per podiatry patient likely will need to go back to the OR for further debridement and washout and potential wound VAC application on Nov 1st --Patient should be nonweightbearing for the most part of all times to the left lower extremity but can use heel contact and surgical shoe when performing transfers. --PT consult appreciated --Wound cultures growing strep agalactiaeand staph aureurs, sensitivities pending.  11/1--Left foot wound  debridement to the level of tendon with removal of all nonviable necrotic tissues; wound measures  approximately 72.8 x 3 x 3 cm Application of wound VAC by dr [name] -ID following-continue with linezolid and Unasyn--will be on po abx at discharge -11/3--per Dr [name] pt will need Wound vac--TOC will be able to arrange it on 03/03/20  # Acute kidney injury: Presenting creatinine noted to be 1.34 relative to most recent prior value of 0.823 when checked on 02/03/2020. -Likely due to intravascular depletion/severe sepsis. Likely exacerbated with losartan. ARB now resumed -Creatinine improved with hydration. Remained stable.  # Obstructive sleep apnea:On CPAP at home reports compliance  # Type 2 diabetes mellitus with hyperglycemia-uncontrolled with PAD --Hemoglobin A1c 9.7 --Discussed with patient importance of controlling his diabetes as outpatient -Likely infection contributing to elevated blood sugars 2.  -currently on Lantus  40 units twice daily with NovoLog with meals 3 times daily to 10 units -added home metformin 1000 mg bid (home med) --Continue R-ISS  # Peripheral artery disease/CAD: -Patient had coronary cath on 02/03/2020 status post DES to mid RCA S/p angiogram on 10/28 Status post angiogram on 02/25/2020-please see #1. Status post angioplasty. -continue with Plavix and Eliquis, per cardiology without interruption for 1 year --on beta-blockers and statin -resumed Imdur  # BPH:On Flomax  #HTN on losartan     DVT prophylaxis:Eliquis Code Status: Full Family Communication:Wife at bedside  Status is: Inpatient  Remains inpatient appropriate because per TOC Wound vac will be arranged tomorrow--11/4   Dispo: The patient is from: Home Anticipated d/c is to: Home Anticipated d/c date is:03/03/2020 Patient currently ismedically stable to d/c.Needs wound vac set up for home . TOC working on it  TOTAL TIME TAKING CARE OF THIS PATIENT: *25* minutes.  >50% time spent on counselling and coordination of  care  Note: This dictation was prepared with Dragon dictation along with smaller phrase technology. Any transcriptional errors that result from this process are unintentional.  [name] M.D    [facility]   CC: Primary care physician; [name], MDPatient ID: Alec Less., male   DOB: 1955-07-31, 64 y.o.   MRN: [id number]

## 2020-03-03 DIAGNOSIS — E1169 Type 2 diabetes mellitus with other specified complication: Secondary | ICD-10-CM

## 2020-03-03 DIAGNOSIS — E669 Obesity, unspecified: Secondary | ICD-10-CM

## 2020-03-03 LAB — GLUCOSE, CAPILLARY
Glucose-Capillary: 210 mg/dL — ABNORMAL HIGH (ref 70–99)
Glucose-Capillary: 204 mg/dL — ABNORMAL HIGH (ref 70–99)

## 2020-03-03 MED ORDER — HYDROCODONE-ACETAMINOPHEN 5-325 MG PO TABS: 1.0000 | ORAL_TABLET | Freq: Four times a day (QID) | ORAL | 0 refills | Status: DC | PRN

## 2020-03-03 MED ORDER — LANTUS SOLOSTAR 100 UNIT/ML ~~LOC~~ SOPN
40.0000 [IU] | PEN_INJECTOR | Freq: Two times a day (BID) | SUBCUTANEOUS | 11 refills | Status: DC
Start: 2020-03-03 — End: 2021-01-16

## 2020-03-03 MED ORDER — INSULIN ASPART 100 UNIT/ML ~~LOC~~ SOLN
10.0000 [IU] | Freq: Three times a day (TID) | SUBCUTANEOUS | 11 refills | Status: DC
Start: 2020-03-03 — End: 2021-01-19

## 2020-03-03 NOTE — TOC Progression Note (Signed)
Transition of Care (TOC) - Progression Note    Patient Details  Name: [name]. MRN: [id number] Date of Birth: Jul 04, 1955  Transition of Care [facility]) CM/SW Contact  Shelbie Ammons, RN Phone Number: 03/03/2020, 1:49 PM  Clinical Narrative:   RNCM met with patient in room. Medicare IM was given. Patient up to side of bed and girlfriend sitting in recliner. Relayed to patient that this CM was asked to come back due to some questions he had. Patient relaying events of last day related to obtaining NPWT and getting ready to go home. Reviewed steps to process of getting NPWT for home use as well as that [facility] has been set up for [facility] once he gets home and patient was agreeable. RNCM will follow for  any further needs.     Expected Discharge Plan: [place] Barriers to Discharge: Continued Medical Work up  Expected Discharge Plan and Services Expected Discharge Plan: [place] Choice: [place] arrangements for the past 2 months: Single Family Home Expected Discharge Date: 03/03/20               DME Arranged: Negative pressure wound device DME Agency: [employer] Date DME Agency Contacted: 03/02/20 Time DME Agency Contacted: 28 Representative spoke with at DME Agency: [place]: RN, PT, OT [place] Agency: [facility] Date [place]: 03/02/20 Time [place]: 1300 Representative spoke with at [place]: [place] Determinants of Health ([place]) Interventions    Readmission Risk Interventions No flowsheet data found.

## 2020-03-03 NOTE — Progress Notes (Signed)
Discharge Note: Reviewed discharge instructions with patient. Pt verbalized understanding.  Portable negative wound vac in place. Pt d/c to home with wound vac kit. Suctioning device. IV cath intact upon removal. Left foot boot on.Staff wheeled Pt out.  Pt transported to home via private vehicle.

## 2020-03-03 NOTE — Consult Note (Signed)
[place] Nurse wound follow up Wound type:Surgical amputation site third metatarsal.   Alarmed blockage overnight and potentially discharging today. I have reached out to discharge planning for clarification on NPWT and HH needs, per patient/wife request.  Measurement:see yesterday's note Wound bed: ruddy red Drainage (amount, consistency, odor) minimal serosanguinous  No odor.  Periwound: wound between toes, complex dressing change.  Requires 3 barrier rings to dorsal foot and between toes to promote seal. Will order 10 rings for discharge for [facility] nurse to use  Dressing procedure/placement/frequency: Continue MOn/Wed/Fri dressing changes.  Will follow.  Domenic Moras MSN, RN, FNP-BC CWON Wound, Ostomy, Continence Nurse Pager [phone]

## 2020-03-03 NOTE — Discharge Summary (Signed)
[facility] [place] NAME: [name]    MR#:  [id number]  DATE OF BIRTH:  [birth year]  DATE OF ADMISSION:  02/23/2020 ADMITTING PHYSICIAN: Rhetta Mura, DO  DATE OF DISCHARGE: 03/03/2020  PRIMARY CARE PHYSICIAN: [name], MD    ADMISSION DIAGNOSIS:  Cellulitis of left lower extremity [Z30.865]  DISCHARGE DIAGNOSIS:  Sepsis POA--resolved due to left foot gangrene Left foot s/p debridement due to Diabetic foot--now has Wound vac Acute Kidney injury in the setting of sepsis--resolved  SECONDARY DIAGNOSIS:   Past Medical History:  Diagnosis Date  . Asthma   . Coronary artery disease   . Depression   . Diabetes mellitus without complication ([place])   . Gout   . History anabolic steroid use   . Hyperlipidemia   . Hypertension   . Hypogonadism in male   . Morbid obesity ([place])   . Myocardial infarction ([place])   . Peripheral vascular disease ([place])   . Perirectal abscess   . Pleurisy   . Sleep apnea    CPAP at night, no oxygen  . Varicella     HOSPITAL COURSE:   Alec Woodis a 64 y.o.malewith medical history significant forperipheral artery disease on chronic Eliquis therapy, coronary artery disease status post PCI with drug-eluting stent to the mid RCA on 02/03/2020, type 2 diabetes mellitus, hypertension, obstructive sleep apnea compliant on home nocturnal CPAP,who is admitted on 10/26/2021with severe sepsis due to left lower extremity cellulitisafter presenting from home to Emergency Department complaining of left lower extremity erythema.  #Sepsis due to cellulitis/left third MTPJ ulceration with gas gangrene: Diabetic foot/PAD --MRI showing evidence of subcutaneous swelling consistent with cellulitis in the absence of any evidence of acute osteomyelitis. --Status post left partial third ray amputation by Dr. Luana Shu on 10/27 --10/29-status post angiogram by Dr. Lucky Cowboy on 10/28. Status post  angioplasty of the left posterior tibial artery with drug-coated angioplasty balloon, angioplasty of the left popliteal artery and proximal tibioperoneal trunk also --10/30--Per podiatry patient likely will need to go back to the OR for further debridement and washout and potential wound VAC applicationon Nov 1st --Patient should be nonweightbearing for the most part of all times to the left lower extremity but can use heel contact and surgical shoe when performing transfers. --PT consultappreciated --Wound cultures growing strep agalactiaeand staph aureurs, sensitivities pending.  11/1--Left foot wound debridement to the level of tendon with removal of all nonviable necrotic tissues;wound measures approximately 78.4 x 3 x 3 cm Application of wound VAC by dr Luana Shu -ID following-continue with linezolid and Unasyn--will be on po abx at discharge -11/3--per Dr Luana Shu pt will need Wound vac--TOC will be able to arrange it on 03/03/20 11/4--overall doing well. Wound vac to be applied for going home  # Acute kidney injury: Presenting creatinine noted to be 1.34 relative to most recent prior value of 0.823 when checked on 02/03/2020. -Likely due to intravascular depletion/severe sepsis.  -ARB now resumed -Creatinine improved with hydration. Remained stable.  # Obstructive sleep apnea:On CPAP at home reports compliance  # Type 2 diabetes mellitus with hyperglycemia-uncontrolledwith PAD --Hemoglobin A1c 9.7 --Discussed with patient importance of controlling his diabetes as outpatient -Likely infection contributing to elevated blood sugars 2. -currently onLantus 40units twice daily withNovoLog with meals 3 times daily to 10 units -added home metformin 1000 mg bid (home med) --Continue R-ISS  # Peripheral artery disease/CAD: -Patient had coronary cath on 02/03/2020 status post DES to mid RCA  S/p angiogram on 10/28 Status post angiogram on 02/25/2020-please see #1. Status post  angioplasty. -continue with ASA,Plavix and Eliquis, per cardiology Dr Ubaldo Glassing without interruption for 1 year --onbeta-blockers and statin -resumed Imdur  # BPH:On Flomax  #HTN on losartan     DVT prophylaxis:Eliquis Code Status: Full Family Communication:Wife at bedside  Status is: Inpatient   Dispo: The patient is from: Home Anticipated d/c is to: Home with [facility] Anticipated d/c date is:03/03/2020 Patient currently is medically stable to d/c today CONSULTS OBTAINED:  Treatment Team:  Tsosie Billing, MD Caroline More, DPM  DRUG ALLERGIES:  No Known Allergies  DISCHARGE MEDICATIONS:   Allergies as of 03/03/2020   No Known Allergies     Medication List    STOP taking these medications   albuterol (2.5 MG/3ML) 0.083% nebulizer solution Commonly known as: PROVENTIL     TAKE these medications   Acidophilus Caps capsule Take 1 capsule by mouth daily.   amoxicillin-clavulanate 875-125 MG tablet Commonly known as: Augmentin Take 1 tablet by mouth 2 (two) times daily for 14 days.   apixaban 5 MG Tabs tablet Commonly known as: ELIQUIS Take 1 tablet (5 mg total) by mouth 2 (two) times daily.   aspirin EC 81 MG tablet Take 1 tablet (81 mg total) by mouth daily. Swallow whole.   atorvastatin 40 MG tablet Commonly known as: LIPITOR Take 1 tablet (40 mg total) by mouth daily.   clopidogrel 75 MG tablet Commonly known as: PLAVIX Take 1 tablet (75 mg total) by mouth daily with breakfast.   ferrous sulfate 325 (65 FE) MG tablet Take 325 mg by mouth daily with breakfast.   furosemide 20 MG tablet Commonly known as: LASIX Take 20 mg by mouth daily as needed for fluid or edema.   HYDROcodone-acetaminophen 5-325 MG tablet Commonly known as: NORCO/VICODIN Take 1 tablet by mouth every 6 (six) hours as needed for severe pain.   insulin aspart 100 UNIT/ML injection Commonly known as: novoLOG Inject 10 Units  into the skin 3 (three) times daily with meals.   isosorbide mononitrate 60 MG 24 hr tablet Commonly known as: IMDUR Take 60 mg by mouth 2 (two) times daily.   Lantus SoloStar 100 UNIT/ML Solostar Pen Generic drug: insulin glargine Inject 40 Units into the skin 2 (two) times daily. What changed:   how much to take  when to take this   linezolid 600 MG tablet Commonly known as: Zyvox Take 1 tablet (600 mg total) by mouth 2 (two) times daily for 14 days.   losartan 100 MG tablet Commonly known as: COZAAR Take 100 mg by mouth daily.   MEDIUM CHAIN TRIGLYCERIDES PO Take 1,000 mg by mouth daily.   metFORMIN 1000 MG tablet Commonly known as: GLUCOPHAGE Take 1,000 mg by mouth 2 (two) times daily.   metoprolol tartrate 50 MG tablet Commonly known as: LOPRESSOR Take 50 mg by mouth 2 (two) times daily.   multivitamin with minerals tablet Take 1 tablet by mouth daily.   nitroGLYCERIN 0.4 MG SL tablet Commonly known as: NITROSTAT Place 0.4 mg under the tongue daily as needed.   pramipexole 1 MG tablet Commonly known as: MIRAPEX Take 1 mg by mouth at bedtime.   primidone 250 MG tablet Commonly known as: MYSOLINE Take 250 mg by mouth 2 (two) times daily.   tamsulosin 0.4 MG Caps capsule Commonly known as: FLOMAX Take 0.4 mg by mouth daily.   vitamin B-12 1000 MCG tablet Commonly known as: CYANOCOBALAMIN Take 10,000 mcg  by mouth daily. Energy shot   zinc gluconate 50 MG tablet Take 50 mg by mouth daily.            Discharge Care Instructions  (From admission, onward)         Start     Ordered   03/03/20 0000  Discharge wound care:       Comments: Patient and spouse are cautioned that NO PRESSURE should be placed on the foot to protect this suture line. I have placed a barrier ring over the plantar suture line and over the dorsal foot. Where skin is denuded.  Dressing procedure/placement/frequency: Cleanse wound to left foot with NS.  Apply barrier rings to  dorsal and plantar foot and between toes to promote seal.  Fill with black foam. Trac pad bridgedto dorsal foot. Covered with drape. Seal achieved with some reinforcement.  Change Mon/wed Fri Amount of suction 125 mm/hg   03/03/20 0845          If you experience worsening of your admission symptoms, develop shortness of breath, life threatening emergency, suicidal or homicidal thoughts you must seek medical attention immediately by calling 911 or calling your MD immediately  if symptoms less severe.  You Must read complete instructions/literature along with all the possible adverse reactions/side effects for all the Medicines you take and that have been prescribed to you. Take any new Medicines after you have completely understood and accept all the possible adverse reactions/side effects.   Please note  You were cared for by a hospitalist during your hospital stay. If you have any questions about your discharge medications or the care you received while you were in the hospital after you are discharged, you can call the unit and asked to speak with the hospitalist on call if the hospitalist that took care of you is not available. Once you are discharged, your primary care physician will handle any further medical issues. Please note that NO REFILLS for any discharge medications will be authorized once you are discharged, as it is imperative that you return to your primary care physician (or establish a relationship with a primary care physician if you do not have one) for your aftercare needs so that they can reassess your need for medications and monitor your lab values. Today   SUBJECTIVE   No new complaints  VITAL SIGNS:  Blood pressure 123/72, pulse 67, temperature 97.9 F (36.6 C), resp. rate 18, height 6\' 4"  (1.93 m), weight (!) 143.2 kg, SpO2 97 %.  I/O:    Intake/Output Summary (Last 24 hours) at 03/03/2020 0848 Last data filed at 03/03/2020 0750 Gross per 24 hour  Intake --   Output 3000 ml  Net -3000 ml    PHYSICAL EXAMINATION:  GENERAL:  64 y.o.-year-old patient lying in the bed with no acute distress. obese EYES: Pupils equal, round, reactive to light and accommodation. No scleral icterus.  HEENT: Head atraumatic, normocephalic. Oropharynx and nasopharynx clear.  NECK:  Supple, no jugular venous distention. No thyroid enlargement, no tenderness.  LUNGS: Normal breath sounds bilaterally, no wheezing, rales,rhonchi or crepitation. No use of accessory muscles of respiration.  CARDIOVASCULAR: S1, S2 normal. No murmurs, rubs, or gallops.  ABDOMEN: Soft, non-tender, non-distended. Bowel sounds present. No organomegaly or mass.  EXTREMITIES: as below NEUROLOGIC: Cranial nerves II through XII are intact. Muscle strength 5/5 in all extremities. Sensation intact. Gait not checked.  PSYCHIATRIC: The patient is alert and oriented x 3.    SKIN: as above  DATA  REVIEW:   CBC  Recent Labs  Lab 03/01/20 0349  WBC 7.5  HGB 12.1*  HCT 37.3*  PLT 269    Chemistries  Recent Labs  Lab 03/01/20 0349  NA 139  K 4.8  CL 102  CO2 29  GLUCOSE 183*  BUN 13  CREATININE 0.99  CALCIUM 8.6*    Microbiology Results   Recent Results (from the past 240 hour(s))  Culture, blood (routine x 2)     Status: None   Collection Time: 02/23/20 10:58 AM   Specimen: BLOOD  Result Value Ref Range Status   Specimen Description BLOOD BLOOD RIGHT FOREARM  Final   Special Requests   Final    BOTTLES DRAWN AEROBIC AND ANAEROBIC Blood Culture adequate volume   Culture   Final    NO GROWTH 5 DAYS Performed at [facility], [address], [place], [place] [address]    Report Status 02/28/2020 FINAL  Final  Culture, blood (routine x 2)     Status: None   Collection Time: 02/23/20 10:58 AM   Specimen: BLOOD  Result Value Ref Range Status   Specimen Description BLOOD Blood Culture adequate volume  Final   Special Requests   Final    BOTTLES DRAWN AEROBIC AND  ANAEROBIC LEFT ANTECUBITAL   Culture   Final    NO GROWTH 5 DAYS Performed at [facility], [address] [place], [place] [address]    Report Status 02/28/2020 FINAL  Final  Respiratory Panel by RT PCR (Flu A&B, Covid) - Nasopharyngeal Swab     Status: None   Collection Time: 02/23/20 11:59 AM   Specimen: Nasopharyngeal Swab  Result Value Ref Range Status   SARS Coronavirus 2 by RT PCR NEGATIVE NEGATIVE Final    Comment: (NOTE) SARS-CoV-2 target nucleic acids are NOT DETECTED.  The SARS-CoV-2 RNA is generally detectable in upper respiratoy specimens during the acute phase of infection. The lowest concentration of SARS-CoV-2 viral copies this assay can detect is 131 copies/mL. A negative result does not preclude SARS-Cov-2 infection and should not be used as the sole basis for treatment or other patient management decisions. A negative result may occur with  improper specimen collection/handling, submission of specimen other than nasopharyngeal swab, presence of viral mutation(s) within the areas targeted by this assay, and inadequate number of viral copies (<131 copies/mL). A negative result must be combined with clinical observations, patient history, and epidemiological information. The expected result is Negative.  Fact Sheet for Patients:  PinkCheek.be  Fact Sheet for Healthcare Providers:  GravelBags.it  This test is no t yet approved or cleared by the [place] FDA and  has been authorized for detection and/or diagnosis of SARS-CoV-2 by FDA under an Emergency Use Authorization (EUA). This EUA will remain  in effect (meaning this test can be used) for the duration of the COVID-19 declaration under Section 564(b)(1) of the Act, 21 U.S.C. section 360bbb-3(b)(1), unless the authorization is terminated or revoked sooner.     Influenza A by PCR NEGATIVE NEGATIVE Final   Influenza B by PCR NEGATIVE  NEGATIVE Final    Comment: (NOTE) The Xpert Xpress SARS-CoV-2/FLU/RSV assay is intended as an aid in  the diagnosis of influenza from Nasopharyngeal swab specimens and  should not be used as a sole basis for treatment. Nasal washings and  aspirates are unacceptable for Xpert Xpress SARS-CoV-2/FLU/RSV  testing.  Fact Sheet for Patients: PinkCheek.be  Fact Sheet for Healthcare Providers: GravelBags.it  This test is  not yet approved or cleared by the [place] and  has been authorized for detection and/or diagnosis of SARS-CoV-2 by  FDA under an Emergency Use Authorization (EUA). This EUA will remain  in effect (meaning this test can be used) for the duration of the  Covid-19 declaration under Section 564(b)(1) of the Act, 21  U.S.C. section 360bbb-3(b)(1), unless the authorization is  terminated or revoked. Performed at [facility], [address], [place], [place] [address]   Surgical pcr screen     Status: None   Collection Time: 02/24/20 12:25 PM   Specimen: Nasal Mucosa; Nasal Swab  Result Value Ref Range Status   MRSA, PCR NEGATIVE NEGATIVE Final   Staphylococcus aureus NEGATIVE NEGATIVE Final    Comment: (NOTE) The Xpert SA Assay (FDA approved for NASAL specimens in patients 50 years of age and older), is one component of a comprehensive surveillance program. It is not intended to diagnose infection nor to guide or monitor treatment. Performed at [facility], [address], [place], [place] [address]   Aerobic/Anaerobic Culture (surgical/deep wound)     Status: None   Collection Time: 02/24/20  5:44 PM   Specimen: PATH Other; Body Fluid  Result Value Ref Range Status   Specimen Description   Final    WOUND 3RD RAY LEFT FOOT SAMPLE A Performed at [facility], [address], [place], [place] [address]    Special Requests   Final    NONE Performed at [facility],  [place], [place] [address]    Gram Stain   Final    NO WBC SEEN ABUNDANT GRAM POSITIVE COCCI FEW GRAM NEGATIVE DIPLOCOCCI    Culture   Final    MODERATE GROUP B STREP(S.AGALACTIAE)ISOLATED TESTING AGAINST S. AGALACTIAE NOT ROUTINELY PERFORMED DUE TO PREDICTABILITY OF AMP/PEN/VAN SUSCEPTIBILITY. RARE METHICILLIN RESISTANT STAPHYLOCOCCUS AUREUS NO ANAEROBES ISOLATED Performed at [facility], [place] [address], [place],  [address]    Report Status 03/01/2020 FINAL  Final   Organism ID, Bacteria METHICILLIN RESISTANT STAPHYLOCOCCUS AUREUS  Final      Susceptibility   Methicillin resistant staphylococcus aureus - MIC*    CIPROFLOXACIN >=8 RESISTANT Resistant     ERYTHROMYCIN <=0.25 SENSITIVE Sensitive     GENTAMICIN <=0.5 SENSITIVE Sensitive     OXACILLIN >=4 RESISTANT Resistant     TETRACYCLINE <=1 SENSITIVE Sensitive     VANCOMYCIN 1 SENSITIVE Sensitive     TRIMETH/SULFA 20 SENSITIVE Sensitive     CLINDAMYCIN <=0.25 SENSITIVE Sensitive     RIFAMPIN <=0.5 SENSITIVE Sensitive     Inducible Clindamycin NEGATIVE Sensitive     * RARE METHICILLIN RESISTANT STAPHYLOCOCCUS AUREUS  Surgical pcr screen     Status: None   Collection Time: 02/28/20  6:45 PM   Specimen: Nasal Mucosa; Nasal Swab  Result Value Ref Range Status   MRSA, PCR NEGATIVE NEGATIVE Final   Staphylococcus aureus NEGATIVE NEGATIVE Final    Comment: (NOTE) The Xpert SA Assay (FDA approved for NASAL specimens in patients 24 years of age and older), is one component of a comprehensive surveillance program. It is not intended to diagnose infection nor to guide or monitor treatment. Performed at [facility], [address] [place],  [address]     RADIOLOGY:  No results found.   CODE STATUS:     Code Status Orders  (From admission, onward)         Start  Ordered   02/23/20 1534  Full code  Continuous        02/23/20 1535        Code Status History    Date  Active Date Inactive Code Status Order ID Comments User Context   02/02/2020 1338 02/03/2020 2234 Full Code [id number]  Isaias Cowman, MD Inpatient   01/24/2016 1724 01/26/2016 1324 Full Code [id number]  Schnier, Dolores Lory, MD Inpatient   Advance Care Planning Activity    Advance Directive Documentation     Most Recent Value  Type of Advance Directive Healthcare Power of Attorney  Pre-existing out of facility DNR order (yellow form or pink MOST form) --  "MOST" Form in Place? --       TOTAL TIME TAKING CARE OF THIS PATIENT: 35 minutes.    Fritzi Mandes M.D  [facility]    CC: Primary care physician; [name], MD

## 2020-03-03 NOTE — Care Management Important Message (Signed)
Important Message  Patient Details  Name: [name]. MRN: [id number] Date of Birth: 08-06-1955   Medicare Important Message Given:  Yes     Shelbie Ammons, RN 03/03/2020, 1:48 PM

## 2020-03-05 DIAGNOSIS — I252 Old myocardial infarction: Secondary | ICD-10-CM | POA: Diagnosis not present

## 2020-03-05 DIAGNOSIS — Z7982 Long term (current) use of aspirin: Secondary | ICD-10-CM | POA: Diagnosis not present

## 2020-03-05 DIAGNOSIS — Z6834 Body mass index (BMI) 34.0-34.9, adult: Secondary | ICD-10-CM | POA: Diagnosis not present

## 2020-03-05 DIAGNOSIS — Z89421 Acquired absence of other right toe(s): Secondary | ICD-10-CM | POA: Diagnosis not present

## 2020-03-05 DIAGNOSIS — Z4801 Encounter for change or removal of surgical wound dressing: Secondary | ICD-10-CM | POA: Diagnosis not present

## 2020-03-05 DIAGNOSIS — I10 Essential (primary) hypertension: Secondary | ICD-10-CM | POA: Diagnosis not present

## 2020-03-05 DIAGNOSIS — G4733 Obstructive sleep apnea (adult) (pediatric): Secondary | ICD-10-CM | POA: Diagnosis not present

## 2020-03-05 DIAGNOSIS — E291 Testicular hypofunction: Secondary | ICD-10-CM | POA: Diagnosis not present

## 2020-03-05 DIAGNOSIS — N400 Enlarged prostate without lower urinary tract symptoms: Secondary | ICD-10-CM | POA: Diagnosis not present

## 2020-03-05 DIAGNOSIS — L03116 Cellulitis of left lower limb: Secondary | ICD-10-CM | POA: Diagnosis not present

## 2020-03-05 DIAGNOSIS — Z7901 Long term (current) use of anticoagulants: Secondary | ICD-10-CM | POA: Diagnosis not present

## 2020-03-05 DIAGNOSIS — Z955 Presence of coronary angioplasty implant and graft: Secondary | ICD-10-CM | POA: Diagnosis not present

## 2020-03-05 DIAGNOSIS — E785 Hyperlipidemia, unspecified: Secondary | ICD-10-CM | POA: Diagnosis not present

## 2020-03-05 DIAGNOSIS — E1151 Type 2 diabetes mellitus with diabetic peripheral angiopathy without gangrene: Secondary | ICD-10-CM | POA: Diagnosis not present

## 2020-03-05 DIAGNOSIS — Z7902 Long term (current) use of antithrombotics/antiplatelets: Secondary | ICD-10-CM | POA: Diagnosis not present

## 2020-03-05 DIAGNOSIS — Z794 Long term (current) use of insulin: Secondary | ICD-10-CM | POA: Diagnosis not present

## 2020-03-05 DIAGNOSIS — Z89422 Acquired absence of other left toe(s): Secondary | ICD-10-CM | POA: Diagnosis not present

## 2020-03-05 DIAGNOSIS — I2510 Atherosclerotic heart disease of native coronary artery without angina pectoris: Secondary | ICD-10-CM | POA: Diagnosis not present

## 2020-03-05 DIAGNOSIS — Z951 Presence of aortocoronary bypass graft: Secondary | ICD-10-CM | POA: Diagnosis not present

## 2020-03-05 DIAGNOSIS — J45909 Unspecified asthma, uncomplicated: Secondary | ICD-10-CM | POA: Diagnosis not present

## 2020-03-05 DIAGNOSIS — M109 Gout, unspecified: Secondary | ICD-10-CM | POA: Diagnosis not present

## 2020-03-05 DIAGNOSIS — Z4781 Encounter for orthopedic aftercare following surgical amputation: Secondary | ICD-10-CM | POA: Diagnosis not present

## 2020-03-05 DIAGNOSIS — Z7984 Long term (current) use of oral hypoglycemic drugs: Secondary | ICD-10-CM | POA: Diagnosis not present

## 2020-03-05 DIAGNOSIS — F32A Depression, unspecified: Secondary | ICD-10-CM | POA: Diagnosis not present

## 2020-03-07 DIAGNOSIS — I10 Essential (primary) hypertension: Secondary | ICD-10-CM | POA: Diagnosis not present

## 2020-03-07 DIAGNOSIS — T8189XA Other complications of procedures, not elsewhere classified, initial encounter: Secondary | ICD-10-CM | POA: Diagnosis not present

## 2020-03-08 DIAGNOSIS — E118 Type 2 diabetes mellitus with unspecified complications: Secondary | ICD-10-CM | POA: Diagnosis not present

## 2020-03-08 DIAGNOSIS — E7800 Pure hypercholesterolemia, unspecified: Secondary | ICD-10-CM | POA: Diagnosis not present

## 2020-03-08 DIAGNOSIS — I10 Essential (primary) hypertension: Secondary | ICD-10-CM | POA: Diagnosis not present

## 2020-03-08 DIAGNOSIS — L97523 Non-pressure chronic ulcer of other part of left foot with necrosis of muscle: Secondary | ICD-10-CM | POA: Diagnosis not present

## 2020-03-09 DIAGNOSIS — I10 Essential (primary) hypertension: Secondary | ICD-10-CM | POA: Diagnosis not present

## 2020-03-09 DIAGNOSIS — L97422 Non-pressure chronic ulcer of left heel and midfoot with fat layer exposed: Secondary | ICD-10-CM | POA: Diagnosis not present

## 2020-03-09 DIAGNOSIS — T8189XA Other complications of procedures, not elsewhere classified, initial encounter: Secondary | ICD-10-CM | POA: Diagnosis not present

## 2020-03-09 DIAGNOSIS — E11621 Type 2 diabetes mellitus with foot ulcer: Secondary | ICD-10-CM | POA: Diagnosis not present

## 2020-03-09 DIAGNOSIS — E1142 Type 2 diabetes mellitus with diabetic polyneuropathy: Secondary | ICD-10-CM | POA: Diagnosis not present

## 2020-03-11 ENCOUNTER — Encounter: Payer: Self-pay | Admitting: Podiatry

## 2020-03-14 DIAGNOSIS — G4733 Obstructive sleep apnea (adult) (pediatric): Secondary | ICD-10-CM | POA: Diagnosis not present

## 2020-03-14 DIAGNOSIS — I10 Essential (primary) hypertension: Secondary | ICD-10-CM | POA: Diagnosis not present

## 2020-03-15 ENCOUNTER — Ambulatory Visit: Payer: PPO | Attending: Infectious Diseases | Admitting: Infectious Diseases

## 2020-03-15 ENCOUNTER — Encounter: Payer: Self-pay | Admitting: Infectious Diseases

## 2020-03-15 ENCOUNTER — Other Ambulatory Visit
Admission: RE | Admit: 2020-03-15 | Discharge: 2020-03-15 | Disposition: A | Discharge status: Home / Self Care | Payer: PPO | Attending: Infectious Diseases | Admitting: Infectious Diseases

## 2020-03-15 VITALS — BP 144/84 | HR 75 | Temp 98.0°F | Resp 16 | Ht 76.0 in | Wt 315.0 lb

## 2020-03-15 DIAGNOSIS — Z833 Family history of diabetes mellitus: Secondary | ICD-10-CM | POA: Insufficient documentation

## 2020-03-15 DIAGNOSIS — Z951 Presence of aortocoronary bypass graft: Secondary | ICD-10-CM | POA: Insufficient documentation

## 2020-03-15 DIAGNOSIS — Z955 Presence of coronary angioplasty implant and graft: Secondary | ICD-10-CM | POA: Insufficient documentation

## 2020-03-15 DIAGNOSIS — D649 Anemia, unspecified: Secondary | ICD-10-CM | POA: Insufficient documentation

## 2020-03-15 DIAGNOSIS — Z794 Long term (current) use of insulin: Secondary | ICD-10-CM | POA: Insufficient documentation

## 2020-03-15 DIAGNOSIS — E785 Hyperlipidemia, unspecified: Secondary | ICD-10-CM | POA: Diagnosis not present

## 2020-03-15 DIAGNOSIS — Z79899 Other long term (current) drug therapy: Secondary | ICD-10-CM | POA: Diagnosis not present

## 2020-03-15 DIAGNOSIS — I10 Essential (primary) hypertension: Secondary | ICD-10-CM | POA: Diagnosis not present

## 2020-03-15 DIAGNOSIS — Z87891 Personal history of nicotine dependence: Secondary | ICD-10-CM | POA: Insufficient documentation

## 2020-03-15 DIAGNOSIS — Z7902 Long term (current) use of antithrombotics/antiplatelets: Secondary | ICD-10-CM | POA: Insufficient documentation

## 2020-03-15 DIAGNOSIS — I2510 Atherosclerotic heart disease of native coronary artery without angina pectoris: Secondary | ICD-10-CM | POA: Diagnosis not present

## 2020-03-15 DIAGNOSIS — L089 Local infection of the skin and subcutaneous tissue, unspecified: Secondary | ICD-10-CM | POA: Insufficient documentation

## 2020-03-15 DIAGNOSIS — Z8249 Family history of ischemic heart disease and other diseases of the circulatory system: Secondary | ICD-10-CM | POA: Diagnosis not present

## 2020-03-15 DIAGNOSIS — E1140 Type 2 diabetes mellitus with diabetic neuropathy, unspecified: Secondary | ICD-10-CM | POA: Diagnosis not present

## 2020-03-15 DIAGNOSIS — Z7982 Long term (current) use of aspirin: Secondary | ICD-10-CM | POA: Diagnosis not present

## 2020-03-15 DIAGNOSIS — Z7901 Long term (current) use of anticoagulants: Secondary | ICD-10-CM | POA: Diagnosis not present

## 2020-03-15 DIAGNOSIS — E1152 Type 2 diabetes mellitus with diabetic peripheral angiopathy with gangrene: Secondary | ICD-10-CM | POA: Insufficient documentation

## 2020-03-15 LAB — CBC WITH DIFFERENTIAL/PLATELET
Abs Immature Granulocytes: 0.03 10*3/uL (ref 0.00–0.07)
Basophils Absolute: 0.1 10*3/uL (ref 0.0–0.1)
Basophils Relative: 1 %
Eosinophils Absolute: 0.3 10*3/uL (ref 0.0–0.5)
Eosinophils Relative: 4 %
HCT: 43.8 % (ref 39.0–52.0)
Hemoglobin: 13.6 g/dL (ref 13.0–17.0)
Immature Granulocytes: 0 %
Lymphocytes Relative: 23 %
Lymphs Abs: 1.8 10*3/uL (ref 0.7–4.0)
MCH: 29.6 pg (ref 26.0–34.0)
MCHC: 31.1 g/dL (ref 30.0–36.0)
MCV: 95.2 fL (ref 80.0–100.0)
Monocytes Absolute: 0.5 10*3/uL (ref 0.1–1.0)
Monocytes Relative: 7 %
Neutro Abs: 4.8 10*3/uL (ref 1.7–7.7)
Neutrophils Relative %: 65 %
Platelets: 184 10*3/uL (ref 150–400)
RBC: 4.6 MIL/uL (ref 4.22–5.81)
RDW: 14 % (ref 11.5–15.5)
WBC: 7.5 10*3/uL (ref 4.0–10.5)
nRBC: 0 % (ref 0.0–0.2)

## 2020-03-15 LAB — SEDIMENTATION RATE: Sed Rate: 25 mm/hr — ABNORMAL HIGH (ref 0–20)

## 2020-03-15 LAB — COMPREHENSIVE METABOLIC PANEL
ALT: 17 U/L (ref 0–44)
AST: 14 U/L — ABNORMAL LOW (ref 15–41)
Albumin: 3.7 g/dL (ref 3.5–5.0)
Alkaline Phosphatase: 109 U/L (ref 38–126)
Anion gap: 11 (ref 5–15)
BUN: 27 mg/dL — ABNORMAL HIGH (ref 8–23)
CO2: 30 mmol/L (ref 22–32)
Calcium: 9.2 mg/dL (ref 8.9–10.3)
Chloride: 96 mmol/L — ABNORMAL LOW (ref 98–111)
Creatinine, Ser: 0.84 mg/dL (ref 0.61–1.24)
GFR, Estimated: >60 mL/min (ref >60)
Glucose, Bld: 286 mg/dL — ABNORMAL HIGH (ref 70–99)
Potassium: 5.1 mmol/L (ref 3.5–5.1)
Sodium: 137 mmol/L (ref 135–145)
Total Bilirubin: 0.6 mg/dL (ref 0.3–1.2)
Total Protein: 7.9 g/dL (ref 6.5–8.1)

## 2020-03-15 LAB — C-REACTIVE PROTEIN: CRP: 0.9 mg/dL (ref <1.0)

## 2020-03-15 NOTE — Progress Notes (Signed)
NAME: [name].  DOB: Aug 29, 1955  MRN: [id number]  Date/Time: 03/15/2020 10:46 AM   Subjective:  Here for follow up for left foot infection. Patient was recently at [facility] between 02/23/2020 and 03/03/2020 for left foot i gangrene.  He underwent amputation of the third toe and debridement ?Culture was positive for MRSA and GBS.  After initial antibiotics in the hospital he was discharged with p.o. linezolid and p.o. Augmentin for 2 weeks. He is here with his wife He has a history of diabetes mellitus, coronary artery disease recent stent placement, hypertension, hyperlipidemia Patient is doing well. His wife is doing the wound VAC changes. She did the dressing last night and and does not want to take it down today They have an appointment with Dr. Luana Shu podiatrist tomorrow. As per the patient and the wife the wound is healing well He is taking linezolid and Augmentin has got 2 more days to go He has no side effects from the medications.  He has no fever.  Past Medical History:  Diagnosis Date  . Asthma   . Coronary artery disease   . Depression   . Diabetes mellitus without complication ([place])   . Gout   . History anabolic steroid use   . Hyperlipidemia   . Hypertension   . Hypogonadism in male   . Morbid obesity ([place])   . Myocardial infarction ([place])   . Peripheral vascular disease ([place])   . Perirectal abscess   . Pleurisy   . Sleep apnea    CPAP at night, no oxygen  . Varicella     Past Surgical History:  Procedure Laterality Date  . ABDOMINAL AORTIC ANEURYSM REPAIR    . AMPUTATION TOE Right 02/10/2016   Procedure: AMPUTATION TOE 3RD TOE;  Surgeon: Samara Deist, DPM;  Location: [facility] ORS;  Service: Podiatry;  Laterality: Right;  . AMPUTATION TOE Left 02/24/2020   Procedure: AMPUTATION TOE;  Surgeon: Caroline More, DPM;  Location: [facility] ORS;  Service: Podiatry;  Laterality: Left;  . APPLICATION OF WOUND VAC Left 02/29/2020   Procedure: APPLICATION OF WOUND VAC;   Surgeon: Caroline More, DPM;  Location: [facility] ORS;  Service: Podiatry;  Laterality: Left;  . COLONOSCOPY WITH PROPOFOL [name] 11/18/2015   Procedure: COLONOSCOPY WITH PROPOFOL;  Surgeon: Manya Silvas, MD;  Location: [facility] ENDOSCOPY;  Service: Endoscopy;  Laterality: [name];  . CORONARY ARTERY BYPASS GRAFT    . CORONARY STENT INTERVENTION [name] 02/02/2020   Procedure: CORONARY STENT INTERVENTION;  Surgeon: Isaias Cowman, MD;  Location: [place] [facility];  Service: Cardiovascular;  Laterality: [name];  . IRRIGATION AND DEBRIDEMENT FOOT Left 02/29/2020   Procedure: IRRIGATION AND DEBRIDEMENT FOOT;  Surgeon: Caroline More, DPM;  Location: [facility] ORS;  Service: Podiatry;  Laterality: Left;  . IRRIGATION AND DEBRIDEMENT FOOT Left 02/24/2020   Procedure: IRRIGATION AND DEBRIDEMENT FOOT;  Surgeon: Caroline More, DPM;  Location: [facility] ORS;  Service: Podiatry;  Laterality: Left;  . KNEE ARTHROSCOPY    . LEFT HEART CATH AND CORS/GRAFTS ANGIOGRAPHY [name] 02/02/2020   Procedure: LEFT HEART CATH AND CORS/GRAFTS ANGIOGRAPHY;  Surgeon: Teodoro Spray, MD;  Location: [place] [facility];  Service: Cardiovascular;  Laterality: [name];  . LOWER EXTREMITY ANGIOGRAPHY Left 02/25/2020   Procedure: Lower Extremity Angiography;  Surgeon: Algernon Huxley, MD;  Location: [place] [facility];  Service: Cardiovascular;  Laterality: Left;  . PERIPHERAL VASCULAR CATHETERIZATION Right 01/24/2016   Procedure: Lower Extremity Angiography;  Surgeon: Katha Cabal, MD;  Location: [facility] INVASIVE CV  LAB;  Service: Cardiovascular;  Laterality: Right;  . PERIPHERAL VASCULAR CATHETERIZATION Right 01/25/2016   Procedure: Lower Extremity Angiography;  Surgeon: Katha Cabal, MD;  Location: [place] [facility];  Service: Cardiovascular;  Laterality: Right;  . TOE AMPUTATION    . TONSILLECTOMY      Social History   Socioeconomic History  . Marital status: Widowed    Spouse name: Not on file  . Number of children: Not on file  . Years of  education: Not on file  . Highest education level: Not on file  Occupational History  . Not on file  Tobacco Use  . Smoking status: Former Smoker    Packs/day: 0.50    Years: 45.00    Pack years: 22.50    Types: Cigarettes    Quit date: 04/05/2015    Years since quitting: 4.9  . Smokeless tobacco: Never Used  Vaping Use  . Vaping Use: Every day  Substance and Sexual Activity  . Alcohol use: Not Currently    Alcohol/week: 3.0 standard drinks    Types: 3 Glasses of wine per week  . Drug use: No  . Sexual activity: Not on file  Other Topics Concern  . Not on file  Social History Narrative  . Not on file   Social Determinants of Health   Financial Resource Strain:   . Difficulty of Paying Living Expenses: Not on file  Food Insecurity:   . Worried About Charity fundraiser in the Last Year: Not on file  . Ran Out of Food in the Last Year: Not on file  Transportation Needs:   . Lack of Transportation (Medical): Not on file  . Lack of Transportation (Non-Medical): Not on file  Physical Activity:   . Days of Exercise per Week: Not on file  . Minutes of Exercise per Session: Not on file  Stress:   . Feeling of Stress : Not on file  Social Connections:   . Frequency of Communication with Friends and Family: Not on file  . Frequency of Social Gatherings with Friends and Family: Not on file  . Attends Religious Services: Not on file  . Active Member of Clubs or Organizations: Not on file  . Attends Archivist Meetings: Not on file  . Marital Status: Not on file  Intimate Partner Violence:   . Fear of Current or Ex-Partner: Not on file  . Emotionally Abused: Not on file  . Physically Abused: Not on file  . Sexually Abused: Not on file    No Known Allergies  FH CAD Father Hypertension Father CVA Father Alcohol abuse Father Asthma mother Diabetes mellitus mother Colon cancer mother Crohn's disease brother ? Current Outpatient Medications  Medication Sig  Dispense Refill  . amoxicillin-clavulanate (AUGMENTIN) 875-125 MG tablet Take 1 tablet by mouth 2 (two) times daily for 14 days. 28 tablet 0  . apixaban (ELIQUIS) 5 MG TABS tablet Take 1 tablet (5 mg total) by mouth 2 (two) times daily. 60 tablet 5  . aspirin EC 81 MG tablet Take 1 tablet (81 mg total) by mouth daily. Swallow whole. 150 tablet 2  . clopidogrel (PLAVIX) 75 MG tablet Take 1 tablet (75 mg total) by mouth daily with breakfast. 30 tablet 0  . ferrous sulfate 325 (65 FE) MG tablet Take 325 mg by mouth daily with breakfast.    . furosemide (LASIX) 20 MG tablet Take 20 mg by mouth daily as needed for fluid or edema.     Marland Kitchen  HYDROcodone-acetaminophen (NORCO/VICODIN) 5-325 MG tablet Take 1 tablet by mouth every 6 (six) hours as needed for severe pain. 20 tablet 0  . insulin aspart (NOVOLOG) 100 UNIT/ML injection Inject 10 Units into the skin 3 (three) times daily with meals. 10 mL 11  . isosorbide mononitrate (IMDUR) 60 MG 24 hr tablet Take 60 mg by mouth 2 (two) times daily.    . Lactobacillus (ACIDOPHILUS) CAPS capsule Take 1 capsule by mouth daily.    Marland Kitchen LANTUS SOLOSTAR 100 UNIT/ML Solostar Pen Inject 40 Units into the skin 2 (two) times daily. 15 mL 11  . linezolid (ZYVOX) 600 MG tablet Take 1 tablet (600 mg total) by mouth 2 (two) times daily for 14 days. 28 tablet 0  . losartan (COZAAR) 100 MG tablet Take 100 mg by mouth daily.    Marland Kitchen MEDIUM CHAIN TRIGLYCERIDES PO Take 1,000 mg by mouth daily.    . metFORMIN (GLUCOPHAGE) 1000 MG tablet Take 1,000 mg by mouth 2 (two) times daily.     . metoprolol (LOPRESSOR) 50 MG tablet Take 50 mg by mouth 2 (two) times daily.    . Multiple Vitamins-Minerals (MULTIVITAMIN WITH MINERALS) tablet Take 1 tablet by mouth daily.    . nitroGLYCERIN (NITROSTAT) 0.4 MG SL tablet Place 0.4 mg under the tongue daily as needed.    . pramipexole (MIRAPEX) 1 MG tablet Take 1 mg by mouth at bedtime.    . primidone (MYSOLINE) 250 MG tablet Take 250 mg by mouth 2 (two)  times daily.    . tamsulosin (FLOMAX) 0.4 MG CAPS capsule Take 0.4 mg by mouth daily.    . vitamin B-12 (CYANOCOBALAMIN) 1000 MCG tablet Take 10,000 mcg by mouth daily. Energy shot    . zinc gluconate 50 MG tablet Take 50 mg by mouth daily.    Marland Kitchen atorvastatin (LIPITOR) 40 MG tablet Take 1 tablet (40 mg total) by mouth daily. 30 tablet 0   No current facility-administered medications for this visit.   Facility-Administered Medications Ordered in Other Visits  Medication Dose Route Frequency Provider Last Rate Last Admin  . sodium chloride flush (NS) 0.9 % injection 3 mL  3 mL Intravenous Q12H Teodoro Spray, MD         Abtx:  Anti-infectives (From admission, onward)   None      REVIEW OF SYSTEMS:  Const: negative fever, negative chills, negative weight loss Eyes: negative diplopia or visual changes, negative eye pain ENT: negative coryza, negative sore throat Resp: negative cough, hemoptysis, dyspnea Cards: negative for chest pain, palpitations, lower extremity edema GU: negative for frequency, dysuria and hematuria GI: Negative for abdominal pain,  Patient currently on keto diet.  Last night had pizza and had loose stools.  Skin: negative for rash and pruritus Heme: negative for easy bruising and gum/nose bleeding MS: using knee scooter, not bearing weight on the left foot Neurolo:negative for headaches, dizziness, vertigo, memory problems  Psych: negative for feelings of anxiety, depression  Endocrine: negative for thyroid, diabetes Allergy/Immunology- negative for any medication or food allergies Objective:  VITALS:  BP (!) 144/84   Pulse 75   Temp 98 F (36.7 C)   Resp 16   Ht 6\' 4"  (1.93 m)   Wt (!) 315 lb (142.9 kg)   SpO2 94%   BMI 38.34 kg/m  PHYSICAL EXAM:  General: Alert, cooperative, no distress, appears stated age.  Neck: Supple, . Back: No CVA tenderness. Lungs: Clear to auscultation bilaterally. No Wheezing or Rhonchi. No rales. Heart:  Regular rate  and rhythm, no murmur, rub or gallop. Abdomen: not examined Extremities: left foot dressing not removed as requested by patient Skin: No rashes or lesions. Or bruising Lymph: Cervical, supraclavicular normal. Neurologic: Grossly non-focal      ? Impression/Recommendation ?Left foot infection in a patient with diabetes mellitus/neuropathy and peripheral artery disease. Status post ray excision of the third toe. MRSA /GBS cultured Acute osteo on pathology Margin clear Had further debridement  02/29/20 and has wound vac On linezolid and augmentin given for 2 weeks - last day Thursday  PAD Status post angioplasty  Diabetes mellitus. not well controlled- on insulin  AKI. resolved  CAD status post CABG Recent stent placement to the RCA. On Plavix and aspirin and eliquis. Hyperlipidemia on atorvastatin Hypertension on metoprolol. Anemia- Will do labs today ??will see him tomorrow in Dr.Baker's office when he changes the dressing ___________________________________________________ Discussed with patient, and wife Note:  This document was prepared using Dragon voice recognition software and may include unintentional dictation errors.

## 2020-03-15 NOTE — Patient Instructions (Addendum)
You are here for follow up of the left foot infection- you recently had amputation of the 3rd toe , you had MRSA and GBS in the wound and you were discharged on linezolid and augmentin for 2 weeks- you had a dressing change last night and you say that the wound looks good- you are seeing Dr.BAker tomorrow and you prefer that I dont remove the dressing today. I Will speak with Dr.baker tomorrow . Today we will do some labs- cbc/cmp/esr today.

## 2020-03-16 DIAGNOSIS — Z4801 Encounter for change or removal of surgical wound dressing: Secondary | ICD-10-CM | POA: Diagnosis not present

## 2020-03-16 DIAGNOSIS — E1151 Type 2 diabetes mellitus with diabetic peripheral angiopathy without gangrene: Secondary | ICD-10-CM | POA: Diagnosis not present

## 2020-03-16 DIAGNOSIS — E1142 Type 2 diabetes mellitus with diabetic polyneuropathy: Secondary | ICD-10-CM | POA: Diagnosis not present

## 2020-03-16 DIAGNOSIS — I10 Essential (primary) hypertension: Secondary | ICD-10-CM | POA: Diagnosis not present

## 2020-03-16 DIAGNOSIS — E11621 Type 2 diabetes mellitus with foot ulcer: Secondary | ICD-10-CM | POA: Diagnosis not present

## 2020-03-16 DIAGNOSIS — I70245 Atherosclerosis of native arteries of left leg with ulceration of other part of foot: Secondary | ICD-10-CM | POA: Diagnosis not present

## 2020-03-16 DIAGNOSIS — G4733 Obstructive sleep apnea (adult) (pediatric): Secondary | ICD-10-CM | POA: Diagnosis not present

## 2020-03-16 DIAGNOSIS — L03116 Cellulitis of left lower limb: Secondary | ICD-10-CM | POA: Diagnosis not present

## 2020-03-16 DIAGNOSIS — Z4781 Encounter for orthopedic aftercare following surgical amputation: Secondary | ICD-10-CM | POA: Diagnosis not present

## 2020-03-16 DIAGNOSIS — I2510 Atherosclerotic heart disease of native coronary artery without angina pectoris: Secondary | ICD-10-CM | POA: Diagnosis not present

## 2020-03-16 DIAGNOSIS — L97423 Non-pressure chronic ulcer of left heel and midfoot with necrosis of muscle: Secondary | ICD-10-CM | POA: Diagnosis not present

## 2020-03-23 DIAGNOSIS — Z7984 Long term (current) use of oral hypoglycemic drugs: Secondary | ICD-10-CM | POA: Diagnosis not present

## 2020-03-23 DIAGNOSIS — I10 Essential (primary) hypertension: Secondary | ICD-10-CM | POA: Diagnosis not present

## 2020-03-23 DIAGNOSIS — I2510 Atherosclerotic heart disease of native coronary artery without angina pectoris: Secondary | ICD-10-CM | POA: Diagnosis not present

## 2020-03-23 DIAGNOSIS — F32A Depression, unspecified: Secondary | ICD-10-CM | POA: Diagnosis not present

## 2020-03-23 DIAGNOSIS — L03116 Cellulitis of left lower limb: Secondary | ICD-10-CM | POA: Diagnosis not present

## 2020-03-23 DIAGNOSIS — Z89421 Acquired absence of other right toe(s): Secondary | ICD-10-CM | POA: Diagnosis not present

## 2020-03-23 DIAGNOSIS — Z89422 Acquired absence of other left toe(s): Secondary | ICD-10-CM | POA: Diagnosis not present

## 2020-03-23 DIAGNOSIS — Z794 Long term (current) use of insulin: Secondary | ICD-10-CM | POA: Diagnosis not present

## 2020-03-23 DIAGNOSIS — I252 Old myocardial infarction: Secondary | ICD-10-CM | POA: Diagnosis not present

## 2020-03-23 DIAGNOSIS — Z6834 Body mass index (BMI) 34.0-34.9, adult: Secondary | ICD-10-CM | POA: Diagnosis not present

## 2020-03-23 DIAGNOSIS — Z4781 Encounter for orthopedic aftercare following surgical amputation: Secondary | ICD-10-CM | POA: Diagnosis not present

## 2020-03-23 DIAGNOSIS — L97423 Non-pressure chronic ulcer of left heel and midfoot with necrosis of muscle: Secondary | ICD-10-CM | POA: Diagnosis not present

## 2020-03-23 DIAGNOSIS — Z7902 Long term (current) use of antithrombotics/antiplatelets: Secondary | ICD-10-CM | POA: Diagnosis not present

## 2020-03-23 DIAGNOSIS — N400 Enlarged prostate without lower urinary tract symptoms: Secondary | ICD-10-CM | POA: Diagnosis not present

## 2020-03-23 DIAGNOSIS — Z951 Presence of aortocoronary bypass graft: Secondary | ICD-10-CM | POA: Diagnosis not present

## 2020-03-23 DIAGNOSIS — Z955 Presence of coronary angioplasty implant and graft: Secondary | ICD-10-CM | POA: Diagnosis not present

## 2020-03-23 DIAGNOSIS — E785 Hyperlipidemia, unspecified: Secondary | ICD-10-CM | POA: Diagnosis not present

## 2020-03-23 DIAGNOSIS — G4733 Obstructive sleep apnea (adult) (pediatric): Secondary | ICD-10-CM | POA: Diagnosis not present

## 2020-03-23 DIAGNOSIS — Z4801 Encounter for change or removal of surgical wound dressing: Secondary | ICD-10-CM | POA: Diagnosis not present

## 2020-03-23 DIAGNOSIS — E291 Testicular hypofunction: Secondary | ICD-10-CM | POA: Diagnosis not present

## 2020-03-23 DIAGNOSIS — E11621 Type 2 diabetes mellitus with foot ulcer: Secondary | ICD-10-CM | POA: Diagnosis not present

## 2020-03-23 DIAGNOSIS — Z7982 Long term (current) use of aspirin: Secondary | ICD-10-CM | POA: Diagnosis not present

## 2020-03-23 DIAGNOSIS — J45909 Unspecified asthma, uncomplicated: Secondary | ICD-10-CM | POA: Diagnosis not present

## 2020-03-23 DIAGNOSIS — E1151 Type 2 diabetes mellitus with diabetic peripheral angiopathy without gangrene: Secondary | ICD-10-CM | POA: Diagnosis not present

## 2020-03-23 DIAGNOSIS — Z7901 Long term (current) use of anticoagulants: Secondary | ICD-10-CM | POA: Diagnosis not present

## 2020-03-23 DIAGNOSIS — M109 Gout, unspecified: Secondary | ICD-10-CM | POA: Diagnosis not present

## 2020-03-28 DIAGNOSIS — I10 Essential (primary) hypertension: Secondary | ICD-10-CM | POA: Diagnosis not present

## 2020-03-28 DIAGNOSIS — T8189XA Other complications of procedures, not elsewhere classified, initial encounter: Secondary | ICD-10-CM | POA: Diagnosis not present

## 2020-03-29 ENCOUNTER — Other Ambulatory Visit (INDEPENDENT_AMBULATORY_CARE_PROVIDER_SITE_OTHER): Payer: Self-pay | Admitting: Vascular Surgery

## 2020-03-29 DIAGNOSIS — I70249 Atherosclerosis of native arteries of left leg with ulceration of unspecified site: Secondary | ICD-10-CM

## 2020-03-29 DIAGNOSIS — Z9862 Peripheral vascular angioplasty status: Secondary | ICD-10-CM

## 2020-03-29 DIAGNOSIS — I739 Peripheral vascular disease, unspecified: Secondary | ICD-10-CM

## 2020-03-30 DIAGNOSIS — Z4781 Encounter for orthopedic aftercare following surgical amputation: Secondary | ICD-10-CM | POA: Diagnosis not present

## 2020-03-30 DIAGNOSIS — E291 Testicular hypofunction: Secondary | ICD-10-CM | POA: Diagnosis not present

## 2020-03-30 DIAGNOSIS — Z89422 Acquired absence of other left toe(s): Secondary | ICD-10-CM | POA: Diagnosis not present

## 2020-03-30 DIAGNOSIS — Z4801 Encounter for change or removal of surgical wound dressing: Secondary | ICD-10-CM | POA: Diagnosis not present

## 2020-03-30 DIAGNOSIS — E785 Hyperlipidemia, unspecified: Secondary | ICD-10-CM | POA: Diagnosis not present

## 2020-03-30 DIAGNOSIS — J45909 Unspecified asthma, uncomplicated: Secondary | ICD-10-CM | POA: Diagnosis not present

## 2020-03-30 DIAGNOSIS — G4733 Obstructive sleep apnea (adult) (pediatric): Secondary | ICD-10-CM | POA: Diagnosis not present

## 2020-03-30 DIAGNOSIS — Z7982 Long term (current) use of aspirin: Secondary | ICD-10-CM | POA: Diagnosis not present

## 2020-03-30 DIAGNOSIS — Z7901 Long term (current) use of anticoagulants: Secondary | ICD-10-CM | POA: Diagnosis not present

## 2020-03-30 DIAGNOSIS — I252 Old myocardial infarction: Secondary | ICD-10-CM | POA: Diagnosis not present

## 2020-03-30 DIAGNOSIS — Z951 Presence of aortocoronary bypass graft: Secondary | ICD-10-CM | POA: Diagnosis not present

## 2020-03-30 DIAGNOSIS — Z7902 Long term (current) use of antithrombotics/antiplatelets: Secondary | ICD-10-CM | POA: Diagnosis not present

## 2020-03-30 DIAGNOSIS — Z955 Presence of coronary angioplasty implant and graft: Secondary | ICD-10-CM | POA: Diagnosis not present

## 2020-03-30 DIAGNOSIS — Z6834 Body mass index (BMI) 34.0-34.9, adult: Secondary | ICD-10-CM | POA: Diagnosis not present

## 2020-03-30 DIAGNOSIS — I2510 Atherosclerotic heart disease of native coronary artery without angina pectoris: Secondary | ICD-10-CM | POA: Diagnosis not present

## 2020-03-30 DIAGNOSIS — Z794 Long term (current) use of insulin: Secondary | ICD-10-CM | POA: Diagnosis not present

## 2020-03-30 DIAGNOSIS — N400 Enlarged prostate without lower urinary tract symptoms: Secondary | ICD-10-CM | POA: Diagnosis not present

## 2020-03-30 DIAGNOSIS — Z89421 Acquired absence of other right toe(s): Secondary | ICD-10-CM | POA: Diagnosis not present

## 2020-03-30 DIAGNOSIS — M109 Gout, unspecified: Secondary | ICD-10-CM | POA: Diagnosis not present

## 2020-03-30 DIAGNOSIS — I10 Essential (primary) hypertension: Secondary | ICD-10-CM | POA: Diagnosis not present

## 2020-03-30 DIAGNOSIS — F32A Depression, unspecified: Secondary | ICD-10-CM | POA: Diagnosis not present

## 2020-03-30 DIAGNOSIS — Z7984 Long term (current) use of oral hypoglycemic drugs: Secondary | ICD-10-CM | POA: Diagnosis not present

## 2020-03-30 DIAGNOSIS — L03116 Cellulitis of left lower limb: Secondary | ICD-10-CM | POA: Diagnosis not present

## 2020-03-30 DIAGNOSIS — E1151 Type 2 diabetes mellitus with diabetic peripheral angiopathy without gangrene: Secondary | ICD-10-CM | POA: Diagnosis not present

## 2020-04-01 ENCOUNTER — Ambulatory Visit (INDEPENDENT_AMBULATORY_CARE_PROVIDER_SITE_OTHER): Payer: PPO

## 2020-04-01 ENCOUNTER — Other Ambulatory Visit: Payer: Self-pay

## 2020-04-01 ENCOUNTER — Encounter (INDEPENDENT_AMBULATORY_CARE_PROVIDER_SITE_OTHER): Payer: Self-pay | Admitting: Nurse Practitioner

## 2020-04-01 ENCOUNTER — Ambulatory Visit (INDEPENDENT_AMBULATORY_CARE_PROVIDER_SITE_OTHER): Payer: PPO | Admitting: Nurse Practitioner

## 2020-04-01 VITALS — BP 157/84 | HR 80 | Resp 16 | Wt 338.0 lb

## 2020-04-01 DIAGNOSIS — I70249 Atherosclerosis of native arteries of left leg with ulceration of unspecified site: Secondary | ICD-10-CM

## 2020-04-01 DIAGNOSIS — I10 Essential (primary) hypertension: Secondary | ICD-10-CM | POA: Diagnosis not present

## 2020-04-01 DIAGNOSIS — I739 Peripheral vascular disease, unspecified: Secondary | ICD-10-CM | POA: Diagnosis not present

## 2020-04-01 DIAGNOSIS — E7800 Pure hypercholesterolemia, unspecified: Secondary | ICD-10-CM | POA: Diagnosis not present

## 2020-04-01 DIAGNOSIS — Z9862 Peripheral vascular angioplasty status: Secondary | ICD-10-CM | POA: Diagnosis not present

## 2020-04-06 DIAGNOSIS — E1142 Type 2 diabetes mellitus with diabetic polyneuropathy: Secondary | ICD-10-CM | POA: Diagnosis not present

## 2020-04-06 DIAGNOSIS — E11621 Type 2 diabetes mellitus with foot ulcer: Secondary | ICD-10-CM | POA: Diagnosis not present

## 2020-04-06 DIAGNOSIS — L97423 Non-pressure chronic ulcer of left heel and midfoot with necrosis of muscle: Secondary | ICD-10-CM | POA: Diagnosis not present

## 2020-04-08 DIAGNOSIS — I10 Essential (primary) hypertension: Secondary | ICD-10-CM | POA: Diagnosis not present

## 2020-04-08 DIAGNOSIS — T8189XA Other complications of procedures, not elsewhere classified, initial encounter: Secondary | ICD-10-CM | POA: Diagnosis not present

## 2020-04-09 NOTE — Progress Notes (Signed)
Subjective:    Patient ID: Alec Less., male    DOB: 01/09/1956, 64 y.o.   MRN: [id number] Chief Complaint  Patient presents with  . Follow-up    [facility] 39month post le angio     The patient returns to the office for followup and review status post angiogram with intervention.  The patient underwent intervention on 02/25/2020 including:  Procedure(s) Performed: 1. Ultrasound guidance for vascular access right femoral artery 2. Catheter placement into left common femoral artery from right femoral approach 3. Aortogram and selective left lower extremity angiogram 4. Percutaneous transluminal angioplasty of left posterior tibial artery with 3 mm diameter conventional and 4 mm diameter Lutonix drug-coated angioplasty balloon 5.  Percutaneous transluminal angioplasty of the left popliteal artery and proximal tibioperoneal trunk with 5 mm diameter Lutonix drug-coated angioplasty balloon             6.  StarClose closure device right femoral artery  The patient does have a history of previous interventions in 2017 to the right lower extremity.   The patient notes improvement in the lower extremity symptoms. No interval shortening of the patient's claudication distance or rest pain symptoms. Previous wounds are healing well but not completely healed at this time.  He continues to follow with podiatry for wound treatment.  No new ulcers or wounds have occurred since the last visit.  There have been no significant changes to the patient's overall health care.  The patient denies amaurosis fugax or recent TIA symptoms. There are no recent neurological changes noted. The patient denies history of DVT, PE or superficial thrombophlebitis. The patient denies recent episodes of angina or shortness of breath.   ABI's Rt=0.99 and Lt=0.84  (previous ABI's Rt=1.18 and Lt=0.85) Duplex US of the right tibial arteries reveals  triphasic waveforms mostly throughout with transition to monophasic waveforms at the proximal popliteal artery.  The previously placed stent is open with no evidence of restenosis.  The peroneal artery was not well visualized.  The left lower extremity shows triphasic waveforms down to the level of the mid SFA where it transitions to biphasic.  The tibial arteries have monophasic waveforms.  It is noted that the left has a 50 to 74% stenosis in the superficial femoral artery.   Review of Systems  Cardiovascular: Positive for leg swelling.  Musculoskeletal: Positive for gait problem.  Skin: Positive for wound.  Neurological: Positive for weakness.  All other systems reviewed and are negative.      Objective:   Physical Exam Vitals reviewed.  HENT:     Head: Normocephalic.  Cardiovascular:     Rate and Rhythm: Normal rate and regular rhythm.     Pulses: Decreased pulses.  Pulmonary:     Effort: Pulmonary effort is normal.  Musculoskeletal:        General: Swelling present.  Neurological:     Mental Status: He is alert and oriented to person, place, and time.  Psychiatric:        Mood and Affect: Mood normal.        Behavior: Behavior normal.        Thought Content: Thought content normal.        Judgment: Judgment normal.     BP (!) 157/84 (BP Location: Right Arm)   Pulse 80   Resp 16   Wt (!) 338 lb (153.3 kg)   BMI 41.14 kg/m   Past Medical History:  Diagnosis Date  . Asthma   . Coronary artery  disease   . Depression   . Diabetes mellitus without complication ([place])   . Gout   . History anabolic steroid use   . Hyperlipidemia   . Hypertension   . Hypogonadism in male   . Morbid obesity ([place])   . Myocardial infarction ([place])   . Peripheral vascular disease ([place])   . Perirectal abscess   . Pleurisy   . Sleep apnea    CPAP at night, no oxygen  . Varicella     Social History   Socioeconomic History  . Marital status: Widowed    Spouse name: Not on file  .  Number of children: Not on file  . Years of education: Not on file  . Highest education level: Not on file  Occupational History  . Not on file  Tobacco Use  . Smoking status: Former Smoker    Packs/day: 0.50    Years: 45.00    Pack years: 22.50    Types: Cigarettes    Quit date: 04/05/2015    Years since quitting: 5.0  . Smokeless tobacco: Never Used  Vaping Use  . Vaping Use: Every day  Substance and Sexual Activity  . Alcohol use: Not Currently    Alcohol/week: 3.0 standard drinks    Types: 3 Glasses of wine per week  . Drug use: No  . Sexual activity: Not on file  Other Topics Concern  . Not on file  Social History Narrative  . Not on file   Social Determinants of Health   Financial Resource Strain: Not on file  Food Insecurity: Not on file  Transportation Needs: Not on file  Physical Activity: Not on file  Stress: Not on file  Social Connections: Not on file  Intimate Partner Violence: Not on file    Past Surgical History:  Procedure Laterality Date  . ABDOMINAL AORTIC ANEURYSM REPAIR    . AMPUTATION TOE Right 02/10/2016   Procedure: AMPUTATION TOE 3RD TOE;  Surgeon: Samara Deist, DPM;  Location: [facility] ORS;  Service: Podiatry;  Laterality: Right;  . AMPUTATION TOE Left 02/24/2020   Procedure: AMPUTATION TOE;  Surgeon: Caroline More, DPM;  Location: [facility];  Service: Podiatry;  Laterality: Left;  . APPLICATION OF WOUND VAC Left 02/29/2020   Procedure: APPLICATION OF WOUND VAC;  Surgeon: Caroline More, DPM;  Location: [facility];  Service: Podiatry;  Laterality: Left;  . COLONOSCOPY WITH PROPOFOL [name] 11/18/2015   Procedure: COLONOSCOPY WITH PROPOFOL;  Surgeon: Manya Silvas, MD;  Location: [facility] ENDOSCOPY;  Service: Endoscopy;  Laterality: [name];  . CORONARY ARTERY BYPASS GRAFT    . CORONARY STENT INTERVENTION [name] 02/02/2020   Procedure: CORONARY STENT INTERVENTION;  Surgeon: Isaias Cowman, MD;  Location: [place] CV LAB;  Service: Cardiovascular;   Laterality: [name];  . IRRIGATION AND DEBRIDEMENT FOOT Left 02/29/2020   Procedure: IRRIGATION AND DEBRIDEMENT FOOT;  Surgeon: Caroline More, DPM;  Location: [facility] OR[facility];  Service: Podiatry;  Laterality: Left;  . IRRIGATION AND DEBRIDEMENT FOOT Left 02/24/2020   Procedure: IRRIGATION AND DEBRIDEMENT FOOT;  Surgeon: Caroline More, DPM;  Location: [facility] OR[facility];  Service: Podiatry;  Laterality: Left;  . KNEE ARTHROSCOPY    . LEFT HEART CATH AND CORS/GRAFTS ANGIOGRAPHY [name] 02/02/2020   Procedure: LEFT HEART CATH AND CORS/GRAFTS ANGIOGRAPHY;  Surgeon: Teodoro Spray, MD;  Location: [place] [facility];  Service: Cardiovascular;  Laterality: [name];  . LOWER EXTREMITY ANGIOGRAPHY Left 02/25/2020   Procedure: Lower Extremity Angiography;  Surgeon: Algernon Huxley, MD;  Location: [place]  [facility];  Service: Cardiovascular;  Laterality: Left;  . PERIPHERAL VASCULAR CATHETERIZATION Right 01/24/2016   Procedure: Lower Extremity Angiography;  Surgeon: Katha Cabal, MD;  Location: [place] [facility];  Service: Cardiovascular;  Laterality: Right;  . PERIPHERAL VASCULAR CATHETERIZATION Right 01/25/2016   Procedure: Lower Extremity Angiography;  Surgeon: Katha Cabal, MD;  Location: [place] [facility];  Service: Cardiovascular;  Laterality: Right;  . TOE AMPUTATION    . TONSILLECTOMY      History reviewed. No pertinent family history.  No Known Allergies  CBC Latest Ref Rng & Units 03/15/2020 03/01/2020 02/27/2020  WBC 4.0 - 10.5 K/uL 7.5 7.5 6.7  Hemoglobin 13.0 - 17.0 g/dL 13.6 12.1(L) 12.3(L)  Hematocrit 39.0 - 52.0 % 43.8 37.3(L) 38.1(L)  Platelets 150 - 400 K/uL 184 269 206      CMP     Component Value Date/Time   NA 137 03/15/2020 1141   NA 141 07/12/2011 0919   K 5.1 03/15/2020 1141   K 4.4 07/12/2011 0919   CL 96 (L) 03/15/2020 1141   CL 102 07/12/2011 0919   CO2 30 03/15/2020 1141   CO2 29 07/12/2011 0919   GLUCOSE 286 (H) 03/15/2020 1141   GLUCOSE 76 07/12/2011 0919   BUN 27 (H)  03/15/2020 1141   BUN 16 05/05/2013 1022   CREATININE 0.84 03/15/2020 1141   CREATININE 0.71 05/05/2013 1022   CALCIUM 9.2 03/15/2020 1141   CALCIUM 8.8 07/12/2011 0919   PROT 7.9 03/15/2020 1141   ALBUMIN 3.7 03/15/2020 1141   AST 14 (L) 03/15/2020 1141   ALT 17 03/15/2020 1141   ALKPHOS 109 03/15/2020 1141   BILITOT 0.6 03/15/2020 1141   GFRNONAA >60 03/15/2020 1141   GFRNONAA >60 05/05/2013 1022   GFRAA >60 01/06/2020 1853   GFRAA >60 05/05/2013 1022     VAS [place] ABI WITH/WO TBI  Result Date: 04/07/2020 LOWER EXTREMITY DOPPLER STUDY  Vascular Interventions: 02/25/2020 PTA of left PTA. PTA of left popliteal and                         proximal tibioperoneal trubk. Comparison Study: 06/28/2016 Performing Technologist: Charlane Ferretti RT (R)(VS)  Examination Guidelines: A complete evaluation includes at minimum, Doppler waveform signals and systolic blood pressure reading at the level of bilateral brachial, anterior tibial, and posterior tibial arteries, when vessel segments are accessible. Bilateral testing is considered an integral part of a complete examination. Photoelectric Plethysmograph (PPG) waveforms and toe systolic pressure readings are included as required and additional duplex testing as needed. Limited examinations for reoccurring indications may be performed as noted.  ABI Findings: +---------+------------------+-----+----------+--------+ Right    Rt Pressure (mmHg)IndexWaveform  Comment  +---------+------------------+-----+----------+--------+ Brachial 135                                       +---------+------------------+-----+----------+--------+ ATA      146               0.99 biphasic           +---------+------------------+-----+----------+--------+ PTA      126               0.86 monophasic         +---------+------------------+-----+----------+--------+ Great Toe126               0.86 Normal              +---------+------------------+-----+----------+--------+ +---------+------------------+-----+----------+---------+  Left     Lt Pressure (mmHg)IndexWaveform  Comment   +---------+------------------+-----+----------+---------+ Brachial 147                                        +---------+------------------+-----+----------+---------+ ATA      124               0.84 monophasic          +---------+------------------+-----+----------+---------+ PTA      101               0.69 monophasic          +---------+------------------+-----+----------+---------+ Great Toe                                 Amputated +---------+------------------+-----+----------+---------+ +-------+-----------+-----------+------------+------------+ ABI/TBIToday's ABIToday's TBIPrevious ABIPrevious TBI +-------+-----------+-----------+------------+------------+ Right  .99        .86        1.18        1.16         +-------+-----------+-----------+------------+------------+ Left   .84                   .85         1.05         +-------+-----------+-----------+------------+------------+ Bilateral ABIs appear essentially unchanged compared to prior study on 06/28/2016. Right TBIs appear essentially unchanged compared to prior study on 06/28/2016.  Summary: Right: Resting right ankle-brachial index is within normal range. No evidence of significant right lower extremity arterial disease. The right toe-brachial index is normal. Left: Resting left ankle-brachial index indicates mild left lower extremity arterial disease. *See table(s) above for measurements and observations.  Electronically signed by Hortencia Pilar MD on 04/07/2020 at 5:48:10 PM.   Final        Assessment & Plan:   1. Atherosclerosis of native artery of left lower extremity with ulceration, unspecified ulceration site [facility]) Recommend:  The patient is status post successful angiogram with intervention.  The patient reports that the  claudication symptoms and leg pain is essentially gone.   The patient denies lifestyle limiting changes at this point in time.  Patient's wound is continuing to heal.  No further invasive studies, angiography or surgery at this time The patient should continue walking and begin a more formal exercise program.  The patient should continue antiplatelet therapy and aggressive treatment of the lipid abnormalities  Smoking cessation was again discussed  The patient should continue wearing graduated compression socks 10-15 mmHg strength to control the mild edema.  Patient should undergo noninvasive studies as ordered. The patient will follow up with me after the studies.    2. Pure hypercholesterolemia Continue statin as ordered and reviewed, no changes at this time   3. Essential hypertension, benign Continue antihypertensive medications as already ordered, these medications have been reviewed and there are no changes at this time.    Current Outpatient Medications on File Prior to Visit  Medication Sig Dispense Refill  . apixaban (ELIQUIS) 5 MG TABS tablet Take 1 tablet (5 mg total) by mouth 2 (two) times daily. 60 tablet 5  . aspirin EC 81 MG tablet Take 1 tablet (81 mg total) by mouth daily. Swallow whole. 150 tablet 2  . atorvastatin (LIPITOR) 40 MG tablet Take 1 tablet (40 mg total) by mouth daily. 30 tablet 0  . clopidogrel (PLAVIX) 75  MG tablet Take 1 tablet (75 mg total) by mouth daily with breakfast. 30 tablet 0  . ferrous sulfate 325 (65 FE) MG tablet Take 325 mg by mouth daily with breakfast.    . furosemide (LASIX) 20 MG tablet Take 20 mg by mouth daily as needed for fluid or edema.     Marland Kitchen HYDROcodone-acetaminophen (NORCO/VICODIN) 5-325 MG tablet Take 1 tablet by mouth every 6 (six) hours as needed for severe pain. 20 tablet 0  . insulin aspart (NOVOLOG) 100 UNIT/ML injection Inject 10 Units into the skin 3 (three) times daily with meals. (Patient taking differently: Inject 18  Units into the skin 3 (three) times daily with meals. ) 10 mL 11  . isosorbide mononitrate (IMDUR) 60 MG 24 hr tablet Take 60 mg by mouth 2 (two) times daily.    . Lactobacillus (ACIDOPHILUS) CAPS capsule Take 1 capsule by mouth daily.    Marland Kitchen LANTUS SOLOSTAR 100 UNIT/ML Solostar Pen Inject 40 Units into the skin 2 (two) times daily. (Patient taking differently: Inject 25 Units into the skin 2 (two) times daily. Pt only takes 25 units at bedtime) 15 mL 11  . losartan (COZAAR) 100 MG tablet Take 100 mg by mouth daily.    Marland Kitchen MEDIUM CHAIN TRIGLYCERIDES PO Take 1,000 mg by mouth daily.    . metFORMIN (GLUCOPHAGE) 1000 MG tablet Take 1,000 mg by mouth 2 (two) times daily.     . metoprolol (LOPRESSOR) 50 MG tablet Take 50 mg by mouth 2 (two) times daily.    . Multiple Vitamins-Minerals (MULTIVITAMIN WITH MINERALS) tablet Take 1 tablet by mouth daily.    . nitroGLYCERIN (NITROSTAT) 0.4 MG SL tablet Place 0.4 mg under the tongue daily as needed.    . pramipexole (MIRAPEX) 1 MG tablet Take 1 mg by mouth at bedtime.    . primidone (MYSOLINE) 250 MG tablet Take 250 mg by mouth 2 (two) times daily.    . tamsulosin (FLOMAX) 0.4 MG CAPS capsule Take 0.4 mg by mouth daily.    . vitamin B-12 (CYANOCOBALAMIN) 1000 MCG tablet Take 10,000 mcg by mouth daily. Energy shot    . zinc gluconate 50 MG tablet Take 50 mg by mouth daily.     Current Facility-Administered Medications on File Prior to Visit  Medication Dose Route Frequency Provider Last Rate Last Admin  . sodium chloride flush (NS) 0.9 % injection 3 mL  3 mL Intravenous Q12H Teodoro Spray, MD        There are no Patient Instructions on file for this visit. No follow-ups on file.   Kris Hartmann, NP

## 2020-04-10 ENCOUNTER — Encounter (INDEPENDENT_AMBULATORY_CARE_PROVIDER_SITE_OTHER): Payer: Self-pay | Admitting: Nurse Practitioner

## 2020-04-11 DIAGNOSIS — E1151 Type 2 diabetes mellitus with diabetic peripheral angiopathy without gangrene: Secondary | ICD-10-CM | POA: Diagnosis not present

## 2020-04-11 DIAGNOSIS — Z4801 Encounter for change or removal of surgical wound dressing: Secondary | ICD-10-CM | POA: Diagnosis not present

## 2020-04-11 DIAGNOSIS — G4733 Obstructive sleep apnea (adult) (pediatric): Secondary | ICD-10-CM | POA: Diagnosis not present

## 2020-04-11 DIAGNOSIS — Z6834 Body mass index (BMI) 34.0-34.9, adult: Secondary | ICD-10-CM | POA: Diagnosis not present

## 2020-04-11 DIAGNOSIS — F32A Depression, unspecified: Secondary | ICD-10-CM | POA: Diagnosis not present

## 2020-04-11 DIAGNOSIS — J45909 Unspecified asthma, uncomplicated: Secondary | ICD-10-CM | POA: Diagnosis not present

## 2020-04-11 DIAGNOSIS — E785 Hyperlipidemia, unspecified: Secondary | ICD-10-CM | POA: Diagnosis not present

## 2020-04-11 DIAGNOSIS — Z794 Long term (current) use of insulin: Secondary | ICD-10-CM | POA: Diagnosis not present

## 2020-04-11 DIAGNOSIS — Z89422 Acquired absence of other left toe(s): Secondary | ICD-10-CM | POA: Diagnosis not present

## 2020-04-11 DIAGNOSIS — M109 Gout, unspecified: Secondary | ICD-10-CM | POA: Diagnosis not present

## 2020-04-11 DIAGNOSIS — I252 Old myocardial infarction: Secondary | ICD-10-CM | POA: Diagnosis not present

## 2020-04-11 DIAGNOSIS — Z951 Presence of aortocoronary bypass graft: Secondary | ICD-10-CM | POA: Diagnosis not present

## 2020-04-11 DIAGNOSIS — I2510 Atherosclerotic heart disease of native coronary artery without angina pectoris: Secondary | ICD-10-CM | POA: Diagnosis not present

## 2020-04-11 DIAGNOSIS — E291 Testicular hypofunction: Secondary | ICD-10-CM | POA: Diagnosis not present

## 2020-04-11 DIAGNOSIS — L03116 Cellulitis of left lower limb: Secondary | ICD-10-CM | POA: Diagnosis not present

## 2020-04-11 DIAGNOSIS — Z89421 Acquired absence of other right toe(s): Secondary | ICD-10-CM | POA: Diagnosis not present

## 2020-04-11 DIAGNOSIS — Z7982 Long term (current) use of aspirin: Secondary | ICD-10-CM | POA: Diagnosis not present

## 2020-04-11 DIAGNOSIS — N400 Enlarged prostate without lower urinary tract symptoms: Secondary | ICD-10-CM | POA: Diagnosis not present

## 2020-04-11 DIAGNOSIS — Z7901 Long term (current) use of anticoagulants: Secondary | ICD-10-CM | POA: Diagnosis not present

## 2020-04-11 DIAGNOSIS — Z7902 Long term (current) use of antithrombotics/antiplatelets: Secondary | ICD-10-CM | POA: Diagnosis not present

## 2020-04-11 DIAGNOSIS — I10 Essential (primary) hypertension: Secondary | ICD-10-CM | POA: Diagnosis not present

## 2020-04-11 DIAGNOSIS — Z7984 Long term (current) use of oral hypoglycemic drugs: Secondary | ICD-10-CM | POA: Diagnosis not present

## 2020-04-11 DIAGNOSIS — Z955 Presence of coronary angioplasty implant and graft: Secondary | ICD-10-CM | POA: Diagnosis not present

## 2020-04-11 DIAGNOSIS — Z4781 Encounter for orthopedic aftercare following surgical amputation: Secondary | ICD-10-CM | POA: Diagnosis not present

## 2020-04-13 DIAGNOSIS — E11621 Type 2 diabetes mellitus with foot ulcer: Secondary | ICD-10-CM | POA: Diagnosis not present

## 2020-04-13 DIAGNOSIS — L97423 Non-pressure chronic ulcer of left heel and midfoot with necrosis of muscle: Secondary | ICD-10-CM | POA: Diagnosis not present

## 2020-04-13 DIAGNOSIS — I10 Essential (primary) hypertension: Secondary | ICD-10-CM | POA: Diagnosis not present

## 2020-04-13 DIAGNOSIS — E1142 Type 2 diabetes mellitus with diabetic polyneuropathy: Secondary | ICD-10-CM | POA: Diagnosis not present

## 2020-04-13 DIAGNOSIS — G4733 Obstructive sleep apnea (adult) (pediatric): Secondary | ICD-10-CM | POA: Diagnosis not present

## 2020-04-20 DIAGNOSIS — I10 Essential (primary) hypertension: Secondary | ICD-10-CM | POA: Diagnosis not present

## 2020-04-20 DIAGNOSIS — T8189XA Other complications of procedures, not elsewhere classified, initial encounter: Secondary | ICD-10-CM | POA: Diagnosis not present

## 2020-06-27 ENCOUNTER — Other Ambulatory Visit (INDEPENDENT_AMBULATORY_CARE_PROVIDER_SITE_OTHER): Payer: Self-pay | Admitting: Vascular Surgery

## 2020-06-27 DIAGNOSIS — I70249 Atherosclerosis of native arteries of left leg with ulceration of unspecified site: Secondary | ICD-10-CM

## 2020-06-27 DIAGNOSIS — Z9862 Peripheral vascular angioplasty status: Secondary | ICD-10-CM

## 2020-06-28 ENCOUNTER — Encounter (INDEPENDENT_AMBULATORY_CARE_PROVIDER_SITE_OTHER): Payer: Self-pay | Admitting: Vascular Surgery

## 2020-06-28 ENCOUNTER — Other Ambulatory Visit: Payer: Self-pay

## 2020-06-28 ENCOUNTER — Ambulatory Visit (INDEPENDENT_AMBULATORY_CARE_PROVIDER_SITE_OTHER): Payer: Medicare Other

## 2020-06-28 ENCOUNTER — Ambulatory Visit (INDEPENDENT_AMBULATORY_CARE_PROVIDER_SITE_OTHER): Payer: Medicare Other | Admitting: Vascular Surgery

## 2020-06-28 VITALS — BP 144/77 | HR 76 | Resp 16 | Wt 335.8 lb

## 2020-06-28 DIAGNOSIS — E669 Obesity, unspecified: Secondary | ICD-10-CM

## 2020-06-28 DIAGNOSIS — I70249 Atherosclerosis of native arteries of left leg with ulceration of unspecified site: Secondary | ICD-10-CM | POA: Diagnosis not present

## 2020-06-28 DIAGNOSIS — E7800 Pure hypercholesterolemia, unspecified: Secondary | ICD-10-CM | POA: Diagnosis not present

## 2020-06-28 DIAGNOSIS — E1169 Type 2 diabetes mellitus with other specified complication: Secondary | ICD-10-CM | POA: Diagnosis not present

## 2020-06-28 DIAGNOSIS — I10 Essential (primary) hypertension: Secondary | ICD-10-CM

## 2020-06-28 DIAGNOSIS — Z9862 Peripheral vascular angioplasty status: Secondary | ICD-10-CM | POA: Diagnosis not present

## 2020-06-28 DIAGNOSIS — I739 Peripheral vascular disease, unspecified: Secondary | ICD-10-CM

## 2020-06-28 NOTE — Assessment & Plan Note (Signed)
His noninvasive studies today show a right ABI of 0.99 and a left ABI of 0.84 duplex shows 50 to 74% stenosis in the left SFA but no other obvious focal stenosis identified in the lower extremities.  His wound is improving but not entirely healed.  We will continue to keep him on a short interval follow-up of 3 months until his wound is completely healed.  He is on Eliquis and Plavix and will continue these until his follow-up visit.

## 2020-06-28 NOTE — Assessment & Plan Note (Signed)
blood pressure control important in reducing the progression of atherosclerotic disease. On appropriate oral medications.  

## 2020-06-28 NOTE — Assessment & Plan Note (Signed)
lipid control important in reducing the progression of atherosclerotic disease. Continue statin therapy  

## 2020-06-28 NOTE — Progress Notes (Signed)
MRN : [id number]  [name]. is a 65 y.o. (1956/02/24) male who presents with chief complaint of  Chief Complaint  Patient presents with  . Follow-up    3 month ultrasound follow up  .  History of Present Illness: Patient returns today in follow up of his PAD.  He underwent left lower extremity revascularization about 4 months ago for nonhealing ulceration.  He has been following with podiatry who is very pleased with the progress of his wound since revascularization.  It is not entirely healed.  His pain is better.  He does have swelling in both legs.  He is a large man and does not stay off his feet very well.  His noninvasive studies today show a right ABI of 0.99 and a left ABI of 0.84 duplex shows 50 to 74% stenosis in the left SFA but no other obvious focal stenosis identified in the lower extremities.  Current Outpatient Medications  Medication Sig Dispense Refill  . apixaban (ELIQUIS) 5 MG TABS tablet Take 1 tablet (5 mg total) by mouth 2 (two) times daily. 60 tablet 5  . aspirin EC 81 MG tablet Take 1 tablet (81 mg total) by mouth daily. Swallow whole. 150 tablet 2  . clopidogrel (PLAVIX) 75 MG tablet Take 1 tablet (75 mg total) by mouth daily with breakfast. 30 tablet 0  . ferrous sulfate 325 (65 FE) MG tablet Take 325 mg by mouth daily with breakfast.    . furosemide (LASIX) 20 MG tablet Take 20 mg by mouth daily as needed for fluid or edema.     Marland Kitchen HYDROcodone-acetaminophen (NORCO/VICODIN) 5-325 MG tablet Take 1 tablet by mouth every 6 (six) hours as needed for severe pain. 20 tablet 0  . insulin aspart (NOVOLOG) 100 UNIT/ML injection Inject 10 Units into the skin 3 (three) times daily with meals. (Patient taking differently: Inject 18 Units into the skin 3 (three) times daily with meals.) 10 mL 11  . isosorbide mononitrate (IMDUR) 60 MG 24 hr tablet Take 60 mg by mouth 2 (two) times daily.    . Lactobacillus (ACIDOPHILUS) CAPS capsule Take 1 capsule by mouth daily.     Marland Kitchen LANTUS SOLOSTAR 100 UNIT/ML Solostar Pen Inject 40 Units into the skin 2 (two) times daily. (Patient taking differently: Inject 25 Units into the skin 2 (two) times daily. Pt only takes 25 units at bedtime) 15 mL 11  . losartan (COZAAR) 100 MG tablet Take 100 mg by mouth daily.    Marland Kitchen MEDIUM CHAIN TRIGLYCERIDES PO Take 1,000 mg by mouth daily.    . metFORMIN (GLUCOPHAGE) 1000 MG tablet Take 1,000 mg by mouth 2 (two) times daily.     . metoprolol (LOPRESSOR) 50 MG tablet Take 50 mg by mouth 2 (two) times daily.    . Multiple Vitamins-Minerals (MULTIVITAMIN WITH MINERALS) tablet Take 1 tablet by mouth daily.    . nitroGLYCERIN (NITROSTAT) 0.4 MG SL tablet Place 0.4 mg under the tongue daily as needed.    Marland Kitchen OZEMPIC, 0.25 OR 0.5 MG/DOSE, 2 MG/1.5ML SOPN Inject into the skin.    . pramipexole (MIRAPEX) 1 MG tablet Take 1 mg by mouth at bedtime.    . primidone (MYSOLINE) 250 MG tablet Take 250 mg by mouth 2 (two) times daily.    . tamsulosin (FLOMAX) 0.4 MG CAPS capsule Take 0.4 mg by mouth daily.    . vitamin B-12 (CYANOCOBALAMIN) 1000 MCG tablet Take 10,000 mcg by mouth daily. Energy shot    .  zinc gluconate 50 MG tablet Take 50 mg by mouth daily.    Marland Kitchen atorvastatin (LIPITOR) 40 MG tablet Take 1 tablet (40 mg total) by mouth daily. 30 tablet 0   No current facility-administered medications for this visit.   Facility-Administered Medications Ordered in Other Visits  Medication Dose Route Frequency Provider Last Rate Last Admin  . sodium chloride flush (NS) 0.9 % injection 3 mL  3 mL Intravenous Q12H Teodoro Spray, MD        Past Medical History:  Diagnosis Date  . Asthma   . Coronary artery disease   . Depression   . Diabetes mellitus without complication ([place])   . Gout   . History anabolic steroid use   . Hyperlipidemia   . Hypertension   . Hypogonadism in male   . Morbid obesity ([place])   . Myocardial infarction ([place])   . Peripheral vascular disease ([place])   . Perirectal abscess   .  Pleurisy   . Sleep apnea    CPAP at night, no oxygen  . Varicella     Past Surgical History:  Procedure Laterality Date  . ABDOMINAL AORTIC ANEURYSM REPAIR    . AMPUTATION TOE Right 02/10/2016   Procedure: AMPUTATION TOE 3RD TOE;  Surgeon: Samara Deist, DPM;  Location: [facility] ORS;  Service: Podiatry;  Laterality: Right;  . AMPUTATION TOE Left 02/24/2020   Procedure: AMPUTATION TOE;  Surgeon: Caroline More, DPM;  Location: [facility];  Service: Podiatry;  Laterality: Left;  . APPLICATION OF WOUND VAC Left 02/29/2020   Procedure: APPLICATION OF WOUND VAC;  Surgeon: Caroline More, DPM;  Location: [facility];  Service: Podiatry;  Laterality: Left;  . COLONOSCOPY WITH PROPOFOL [name] 11/18/2015   Procedure: COLONOSCOPY WITH PROPOFOL;  Surgeon: Manya Silvas, MD;  Location: [facility] ENDOSCOPY;  Service: Endoscopy;  Laterality: [name];  . CORONARY ARTERY BYPASS GRAFT    . CORONARY STENT INTERVENTION [name] 02/02/2020   Procedure: CORONARY STENT INTERVENTION;  Surgeon: Isaias Cowman, MD;  Location: [place] [facility];  Service: Cardiovascular;  Laterality: [name];  . IRRIGATION AND DEBRIDEMENT FOOT Left 02/29/2020   Procedure: IRRIGATION AND DEBRIDEMENT FOOT;  Surgeon: Caroline More, DPM;  Location: [facility] OR[facility];  Service: Podiatry;  Laterality: Left;  . IRRIGATION AND DEBRIDEMENT FOOT Left 02/24/2020   Procedure: IRRIGATION AND DEBRIDEMENT FOOT;  Surgeon: Caroline More, DPM;  Location: [facility] ORS;  Service: Podiatry;  Laterality: Left;  . KNEE ARTHROSCOPY    . LEFT HEART CATH AND CORS/GRAFTS ANGIOGRAPHY [name] 02/02/2020   Procedure: LEFT HEART CATH AND CORS/GRAFTS ANGIOGRAPHY;  Surgeon: Teodoro Spray, MD;  Location: [place] [facility];  Service: Cardiovascular;  Laterality: [name];  . LOWER EXTREMITY ANGIOGRAPHY Left 02/25/2020   Procedure: Lower Extremity Angiography;  Surgeon: Algernon Huxley, MD;  Location: [place] [facility];  Service: Cardiovascular;  Laterality: Left;  . PERIPHERAL VASCULAR CATHETERIZATION  Right 01/24/2016   Procedure: Lower Extremity Angiography;  Surgeon: Katha Cabal, MD;  Location: [place] [facility];  Service: Cardiovascular;  Laterality: Right;  . PERIPHERAL VASCULAR CATHETERIZATION Right 01/25/2016   Procedure: Lower Extremity Angiography;  Surgeon: Katha Cabal, MD;  Location: [place] [facility];  Service: Cardiovascular;  Laterality: Right;  . TOE AMPUTATION    . TONSILLECTOMY       Social History   Tobacco Use  . Smoking status: Former Smoker    Packs/day: 0.50    Years: 45.00    Pack years: 22.50    Types: Cigarettes  Quit date: 04/05/2015    Years since quitting: 5.2  . Smokeless tobacco: Never Used  Vaping Use  . Vaping Use: Every day  Substance Use Topics  . Alcohol use: Not Currently    Alcohol/week: 3.0 standard drinks    Types: 3 Glasses of wine per week  . Drug use: No      Family History No bleeding or clotting disorders  Allergies  Allergen Reactions  . Statins     Other reaction(s): Muscle Pain Causes legs to ache per pt     REVIEW OF SYSTEMS (Negative unless checked)  Constitutional: [] Weight loss  [] Fever  [] Chills Cardiac: [] Chest pain   [] Chest pressure   [] Palpitations   [] Shortness of breath when laying flat   [] Shortness of breath at rest   [] Shortness of breath with exertion. Vascular:  [] Pain in legs with walking   [] Pain in legs at rest   [] Pain in legs when laying flat   [] Claudication   [] Pain in feet when walking  [] Pain in feet at rest  [] Pain in feet when laying flat   [] History of DVT   [] Phlebitis   [] Swelling in legs   [] Varicose veins   [x] Non-healing ulcers Pulmonary:   [] Uses home oxygen   [] Productive cough   [] Hemoptysis   [] Wheeze  [] COPD   [] Asthma Neurologic:  [] Dizziness  [] Blackouts   [] Seizures   [] History of stroke   [] History of TIA  [] Aphasia   [] Temporary blindness   [] Dysphagia   [] Weakness or numbness in arms   [] Weakness or numbness in legs Musculoskeletal:  [x] Arthritis   [] Joint  swelling   [] Joint pain   [] Low back pain Hematologic:  [] Easy bruising  [] Easy bleeding   [] Hypercoagulable state   [] Anemic   Gastrointestinal:  [] Blood in stool   [] Vomiting blood  [] Gastroesophageal reflux/heartburn   [] Abdominal pain Genitourinary:  [] Chronic kidney disease   [] Difficult urination  [] Frequent urination  [] Burning with urination   [] Hematuria Skin:  [] Rashes   [x] Ulcers   [x] Wounds Psychological:  [] History of anxiety   []  History of major depression.  Physical Examination  BP (!) 144/77 (BP Location: Right Arm)   Pulse 76   Resp 16   Wt (!) 335 lb 12.8 oz (152.3 kg)   BMI 40.87 kg/m  Gen:  WD/WN, NAD. Obese  Head: /AT, No temporalis wasting. Ear/Nose/Throat: Hearing grossly intact, nares w/o erythema or drainage Eyes: Conjunctiva clear. Sclera non-icteric Neck: Supple.  Trachea midline Pulmonary:  Good air movement, no use of accessory muscles.  Cardiac: irregular Vascular:  Vessel Right Left  Radial Palpable Palpable                          PT  not palpable  not palpable  DP  trace palpable  trace palpable    Musculoskeletal: M/S 5/5 throughout.  No deformity or atrophy.  Left foot wound is currently dressed.  2+ bilateral lower extremity edema. Neurologic: Sensation grossly intact in extremities.  Symmetrical.  Speech is fluent.  Psychiatric: Judgment intact, Mood & affect appropriate for pt's clinical situation. Dermatologic: Left foot wound currently dressed       Labs No results found for this or any previous visit (from the past 2160 hour(s)).  Radiology No results found.  Assessment/Plan  Diabetes mellitus type 2 in obese (HCC) blood glucose control important in reducing the progression of atherosclerotic disease. Also, involved in wound healing. On appropriate medications.   Essential hypertension, benign  blood pressure control important in reducing the progression of atherosclerotic disease. On appropriate oral  medications.   Pure hypercholesterolemia lipid control important in reducing the progression of atherosclerotic disease. Continue statin therapy   PAD (peripheral artery disease) ([place]) His noninvasive studies today show a right ABI of 0.99 and a left ABI of 0.84 duplex shows 50 to 74% stenosis in the left SFA but no other obvious focal stenosis identified in the lower extremities.  His wound is improving but not entirely healed.  We will continue to keep him on a short interval follow-up of 3 months until his wound is completely healed.  He is on Eliquis and Plavix and will continue these until his follow-up visit.    [name], MD  06/28/2020 4:13 PM    This note was created with Dragon medical transcription system.  Any errors from dictation are purely unintentional

## 2020-06-28 NOTE — Assessment & Plan Note (Signed)
blood glucose control important in reducing the progression of atherosclerotic disease. Also, involved in wound healing. On appropriate medications.  

## 2020-09-09 DIAGNOSIS — I70245 Atherosclerosis of native arteries of left leg with ulceration of other part of foot: Secondary | ICD-10-CM | POA: Diagnosis not present

## 2020-09-09 DIAGNOSIS — I739 Peripheral vascular disease, unspecified: Secondary | ICD-10-CM | POA: Diagnosis not present

## 2020-09-09 DIAGNOSIS — E1142 Type 2 diabetes mellitus with diabetic polyneuropathy: Secondary | ICD-10-CM | POA: Diagnosis not present

## 2020-09-09 DIAGNOSIS — L97522 Non-pressure chronic ulcer of other part of left foot with fat layer exposed: Secondary | ICD-10-CM | POA: Diagnosis not present

## 2020-09-12 DIAGNOSIS — E1159 Type 2 diabetes mellitus with other circulatory complications: Secondary | ICD-10-CM | POA: Diagnosis not present

## 2020-09-12 DIAGNOSIS — Z794 Long term (current) use of insulin: Secondary | ICD-10-CM | POA: Diagnosis not present

## 2020-09-12 DIAGNOSIS — E1169 Type 2 diabetes mellitus with other specified complication: Secondary | ICD-10-CM | POA: Diagnosis not present

## 2020-09-12 DIAGNOSIS — E1142 Type 2 diabetes mellitus with diabetic polyneuropathy: Secondary | ICD-10-CM | POA: Diagnosis not present

## 2020-09-16 DIAGNOSIS — E7800 Pure hypercholesterolemia, unspecified: Secondary | ICD-10-CM | POA: Diagnosis not present

## 2020-09-16 DIAGNOSIS — Z79899 Other long term (current) drug therapy: Secondary | ICD-10-CM | POA: Diagnosis not present

## 2020-09-16 DIAGNOSIS — E1121 Type 2 diabetes mellitus with diabetic nephropathy: Secondary | ICD-10-CM | POA: Diagnosis not present

## 2020-09-16 DIAGNOSIS — I10 Essential (primary) hypertension: Secondary | ICD-10-CM | POA: Diagnosis not present

## 2020-09-21 DIAGNOSIS — L97522 Non-pressure chronic ulcer of other part of left foot with fat layer exposed: Secondary | ICD-10-CM | POA: Diagnosis not present

## 2020-09-28 DIAGNOSIS — E7800 Pure hypercholesterolemia, unspecified: Secondary | ICD-10-CM | POA: Diagnosis not present

## 2020-09-28 DIAGNOSIS — L97522 Non-pressure chronic ulcer of other part of left foot with fat layer exposed: Secondary | ICD-10-CM | POA: Diagnosis not present

## 2020-09-28 DIAGNOSIS — I739 Peripheral vascular disease, unspecified: Secondary | ICD-10-CM | POA: Diagnosis not present

## 2020-09-28 DIAGNOSIS — I70245 Atherosclerosis of native arteries of left leg with ulceration of other part of foot: Secondary | ICD-10-CM | POA: Diagnosis not present

## 2020-09-28 DIAGNOSIS — E1121 Type 2 diabetes mellitus with diabetic nephropathy: Secondary | ICD-10-CM | POA: Diagnosis not present

## 2020-09-28 DIAGNOSIS — E1142 Type 2 diabetes mellitus with diabetic polyneuropathy: Secondary | ICD-10-CM | POA: Diagnosis not present

## 2020-09-28 DIAGNOSIS — Z79899 Other long term (current) drug therapy: Secondary | ICD-10-CM | POA: Diagnosis not present

## 2020-09-28 DIAGNOSIS — I10 Essential (primary) hypertension: Secondary | ICD-10-CM | POA: Diagnosis not present

## 2020-09-29 DIAGNOSIS — K449 Diaphragmatic hernia without obstruction or gangrene: Secondary | ICD-10-CM | POA: Diagnosis not present

## 2020-09-29 DIAGNOSIS — Z951 Presence of aortocoronary bypass graft: Secondary | ICD-10-CM | POA: Diagnosis not present

## 2020-09-29 DIAGNOSIS — S0081XA Abrasion of other part of head, initial encounter: Secondary | ICD-10-CM | POA: Diagnosis not present

## 2020-09-29 DIAGNOSIS — S199XXA Unspecified injury of neck, initial encounter: Secondary | ICD-10-CM | POA: Diagnosis not present

## 2020-09-29 DIAGNOSIS — S30811A Abrasion of abdominal wall, initial encounter: Secondary | ICD-10-CM | POA: Diagnosis not present

## 2020-09-29 DIAGNOSIS — Z7901 Long term (current) use of anticoagulants: Secondary | ICD-10-CM | POA: Diagnosis not present

## 2020-09-29 DIAGNOSIS — Z7902 Long term (current) use of antithrombotics/antiplatelets: Secondary | ICD-10-CM | POA: Diagnosis not present

## 2020-09-29 DIAGNOSIS — Z041 Encounter for examination and observation following transport accident: Secondary | ICD-10-CM | POA: Diagnosis not present

## 2020-09-29 DIAGNOSIS — S0990XA Unspecified injury of head, initial encounter: Secondary | ICD-10-CM | POA: Diagnosis not present

## 2020-09-29 DIAGNOSIS — S0181XA Laceration without foreign body of other part of head, initial encounter: Secondary | ICD-10-CM | POA: Diagnosis not present

## 2020-09-29 DIAGNOSIS — E1151 Type 2 diabetes mellitus with diabetic peripheral angiopathy without gangrene: Secondary | ICD-10-CM | POA: Diagnosis not present

## 2020-09-29 DIAGNOSIS — R931 Abnormal findings on diagnostic imaging of heart and coronary circulation: Secondary | ICD-10-CM | POA: Diagnosis not present

## 2020-09-29 DIAGNOSIS — S0083XA Contusion of other part of head, initial encounter: Secondary | ICD-10-CM | POA: Diagnosis not present

## 2020-09-29 DIAGNOSIS — Z794 Long term (current) use of insulin: Secondary | ICD-10-CM | POA: Diagnosis not present

## 2020-09-29 DIAGNOSIS — S301XXA Contusion of abdominal wall, initial encounter: Secondary | ICD-10-CM | POA: Diagnosis not present

## 2020-09-29 DIAGNOSIS — Z79899 Other long term (current) drug therapy: Secondary | ICD-10-CM | POA: Diagnosis not present

## 2020-09-29 DIAGNOSIS — I2510 Atherosclerotic heart disease of native coronary artery without angina pectoris: Secondary | ICD-10-CM | POA: Diagnosis not present

## 2020-09-29 DIAGNOSIS — K760 Fatty (change of) liver, not elsewhere classified: Secondary | ICD-10-CM | POA: Diagnosis not present

## 2020-09-29 DIAGNOSIS — S50812A Abrasion of left forearm, initial encounter: Secondary | ICD-10-CM | POA: Diagnosis not present

## 2020-09-29 DIAGNOSIS — Z043 Encounter for examination and observation following other accident: Secondary | ICD-10-CM | POA: Diagnosis not present

## 2020-10-05 DIAGNOSIS — M7989 Other specified soft tissue disorders: Secondary | ICD-10-CM | POA: Diagnosis not present

## 2020-10-05 DIAGNOSIS — S8002XA Contusion of left knee, initial encounter: Secondary | ICD-10-CM | POA: Diagnosis not present

## 2020-10-05 DIAGNOSIS — M1612 Unilateral primary osteoarthritis, left hip: Secondary | ICD-10-CM | POA: Diagnosis not present

## 2020-10-05 DIAGNOSIS — L97522 Non-pressure chronic ulcer of other part of left foot with fat layer exposed: Secondary | ICD-10-CM | POA: Diagnosis not present

## 2020-10-05 DIAGNOSIS — S7012XA Contusion of left thigh, initial encounter: Secondary | ICD-10-CM | POA: Diagnosis not present

## 2020-10-10 ENCOUNTER — Emergency Department
Admission: EM | Admit: 2020-10-10 | Discharge: 2020-10-10 | Disposition: A | Discharge status: Home / Self Care | Payer: Medicare Other | Attending: Emergency Medicine | Admitting: Emergency Medicine

## 2020-10-10 ENCOUNTER — Emergency Department: Payer: Medicare Other

## 2020-10-10 ENCOUNTER — Encounter: Payer: Self-pay | Admitting: Emergency Medicine

## 2020-10-10 ENCOUNTER — Other Ambulatory Visit: Payer: Self-pay

## 2020-10-10 DIAGNOSIS — Z951 Presence of aortocoronary bypass graft: Secondary | ICD-10-CM | POA: Insufficient documentation

## 2020-10-10 DIAGNOSIS — Z794 Long term (current) use of insulin: Secondary | ICD-10-CM | POA: Diagnosis not present

## 2020-10-10 DIAGNOSIS — R600 Localized edema: Secondary | ICD-10-CM | POA: Diagnosis not present

## 2020-10-10 DIAGNOSIS — I2510 Atherosclerotic heart disease of native coronary artery without angina pectoris: Secondary | ICD-10-CM | POA: Insufficient documentation

## 2020-10-10 DIAGNOSIS — Z7902 Long term (current) use of antithrombotics/antiplatelets: Secondary | ICD-10-CM | POA: Insufficient documentation

## 2020-10-10 DIAGNOSIS — Z79899 Other long term (current) drug therapy: Secondary | ICD-10-CM | POA: Insufficient documentation

## 2020-10-10 DIAGNOSIS — Z87891 Personal history of nicotine dependence: Secondary | ICD-10-CM | POA: Insufficient documentation

## 2020-10-10 DIAGNOSIS — Z7982 Long term (current) use of aspirin: Secondary | ICD-10-CM | POA: Diagnosis not present

## 2020-10-10 DIAGNOSIS — Z7984 Long term (current) use of oral hypoglycemic drugs: Secondary | ICD-10-CM | POA: Diagnosis not present

## 2020-10-10 DIAGNOSIS — S7012XD Contusion of left thigh, subsequent encounter: Secondary | ICD-10-CM

## 2020-10-10 DIAGNOSIS — E119 Type 2 diabetes mellitus without complications: Secondary | ICD-10-CM | POA: Insufficient documentation

## 2020-10-10 DIAGNOSIS — S7012XA Contusion of left thigh, initial encounter: Secondary | ICD-10-CM | POA: Insufficient documentation

## 2020-10-10 DIAGNOSIS — S79922A Unspecified injury of left thigh, initial encounter: Secondary | ICD-10-CM | POA: Diagnosis present

## 2020-10-10 DIAGNOSIS — M79605 Pain in left leg: Secondary | ICD-10-CM | POA: Diagnosis not present

## 2020-10-10 DIAGNOSIS — Z955 Presence of coronary angioplasty implant and graft: Secondary | ICD-10-CM | POA: Diagnosis not present

## 2020-10-10 DIAGNOSIS — J45909 Unspecified asthma, uncomplicated: Secondary | ICD-10-CM | POA: Insufficient documentation

## 2020-10-10 DIAGNOSIS — S8012XA Contusion of left lower leg, initial encounter: Secondary | ICD-10-CM | POA: Diagnosis not present

## 2020-10-10 DIAGNOSIS — Y92410 Unspecified street and highway as the place of occurrence of the external cause: Secondary | ICD-10-CM | POA: Diagnosis not present

## 2020-10-10 DIAGNOSIS — I10 Essential (primary) hypertension: Secondary | ICD-10-CM | POA: Diagnosis not present

## 2020-10-10 DIAGNOSIS — Z7901 Long term (current) use of anticoagulants: Secondary | ICD-10-CM | POA: Insufficient documentation

## 2020-10-10 DIAGNOSIS — Z8679 Personal history of other diseases of the circulatory system: Secondary | ICD-10-CM | POA: Diagnosis not present

## 2020-10-10 DIAGNOSIS — M7989 Other specified soft tissue disorders: Secondary | ICD-10-CM | POA: Diagnosis not present

## 2020-10-10 MED ORDER — OXYCODONE-ACETAMINOPHEN 5-325 MG PO TABS: 1.0000 | ORAL_TABLET | Freq: Four times a day (QID) | ORAL | 0 refills | Status: DC | PRN

## 2020-10-10 MED ORDER — OXYCODONE-ACETAMINOPHEN 5-325 MG PO TABS
1.0000 | ORAL_TABLET | Freq: Once | ORAL | Status: AC
  Administered 2020-10-10: 1 via ORAL
  Filled 2020-10-10: qty 1

## 2020-10-10 MED ORDER — CYCLOBENZAPRINE HCL 5 MG PO TABS: 5.0000 mg | ORAL_TABLET | Freq: Three times a day (TID) | ORAL | 0 refills | Status: DC | PRN
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
## 2020-10-12 DIAGNOSIS — E1142 Type 2 diabetes mellitus with diabetic polyneuropathy: Secondary | ICD-10-CM | POA: Diagnosis not present

## 2020-10-12 DIAGNOSIS — I739 Peripheral vascular disease, unspecified: Secondary | ICD-10-CM | POA: Diagnosis not present

## 2020-10-12 DIAGNOSIS — L97522 Non-pressure chronic ulcer of other part of left foot with fat layer exposed: Secondary | ICD-10-CM | POA: Diagnosis not present

## 2020-10-12 DIAGNOSIS — I70245 Atherosclerosis of native arteries of left leg with ulceration of other part of foot: Secondary | ICD-10-CM | POA: Diagnosis not present

## 2020-10-16 ENCOUNTER — Other Ambulatory Visit (INDEPENDENT_AMBULATORY_CARE_PROVIDER_SITE_OTHER): Payer: Self-pay | Admitting: Vascular Surgery

## 2020-10-16 DIAGNOSIS — I739 Peripheral vascular disease, unspecified: Secondary | ICD-10-CM

## 2020-10-18 ENCOUNTER — Encounter (INDEPENDENT_AMBULATORY_CARE_PROVIDER_SITE_OTHER): Payer: Medicare Other

## 2020-10-18 ENCOUNTER — Ambulatory Visit (INDEPENDENT_AMBULATORY_CARE_PROVIDER_SITE_OTHER): Payer: Medicare Other | Admitting: Vascular Surgery

## 2020-10-19 DIAGNOSIS — L97522 Non-pressure chronic ulcer of other part of left foot with fat layer exposed: Secondary | ICD-10-CM | POA: Diagnosis not present

## 2020-10-19 DIAGNOSIS — L03116 Cellulitis of left lower limb: Secondary | ICD-10-CM | POA: Diagnosis not present

## 2020-10-19 DIAGNOSIS — E1142 Type 2 diabetes mellitus with diabetic polyneuropathy: Secondary | ICD-10-CM | POA: Diagnosis not present

## 2020-10-19 DIAGNOSIS — I70245 Atherosclerosis of native arteries of left leg with ulceration of other part of foot: Secondary | ICD-10-CM | POA: Diagnosis not present

## 2020-10-26 DIAGNOSIS — I739 Peripheral vascular disease, unspecified: Secondary | ICD-10-CM | POA: Diagnosis not present

## 2020-10-26 DIAGNOSIS — I70245 Atherosclerosis of native arteries of left leg with ulceration of other part of foot: Secondary | ICD-10-CM | POA: Diagnosis not present

## 2020-10-26 DIAGNOSIS — L97522 Non-pressure chronic ulcer of other part of left foot with fat layer exposed: Secondary | ICD-10-CM | POA: Diagnosis not present

## 2020-10-26 DIAGNOSIS — E1142 Type 2 diabetes mellitus with diabetic polyneuropathy: Secondary | ICD-10-CM | POA: Diagnosis not present

## 2020-11-02 DIAGNOSIS — E1142 Type 2 diabetes mellitus with diabetic polyneuropathy: Secondary | ICD-10-CM | POA: Diagnosis not present

## 2020-11-02 DIAGNOSIS — S80822A Blister (nonthermal), left lower leg, initial encounter: Secondary | ICD-10-CM | POA: Diagnosis not present

## 2020-11-02 DIAGNOSIS — L97522 Non-pressure chronic ulcer of other part of left foot with fat layer exposed: Secondary | ICD-10-CM | POA: Diagnosis not present

## 2020-11-02 DIAGNOSIS — S90822A Blister (nonthermal), left foot, initial encounter: Secondary | ICD-10-CM | POA: Diagnosis not present

## 2020-11-09 DIAGNOSIS — Z0000 Encounter for general adult medical examination without abnormal findings: Secondary | ICD-10-CM | POA: Diagnosis not present

## 2020-11-09 DIAGNOSIS — L97522 Non-pressure chronic ulcer of other part of left foot with fat layer exposed: Secondary | ICD-10-CM | POA: Diagnosis not present

## 2020-11-09 DIAGNOSIS — E1142 Type 2 diabetes mellitus with diabetic polyneuropathy: Secondary | ICD-10-CM | POA: Diagnosis not present

## 2020-11-09 DIAGNOSIS — I70245 Atherosclerosis of native arteries of left leg with ulceration of other part of foot: Secondary | ICD-10-CM | POA: Diagnosis not present

## 2020-11-09 DIAGNOSIS — E119 Type 2 diabetes mellitus without complications: Secondary | ICD-10-CM | POA: Diagnosis not present

## 2020-11-09 DIAGNOSIS — I739 Peripheral vascular disease, unspecified: Secondary | ICD-10-CM | POA: Diagnosis not present

## 2020-11-09 DIAGNOSIS — I10 Essential (primary) hypertension: Secondary | ICD-10-CM | POA: Diagnosis not present

## 2020-11-09 DIAGNOSIS — I2510 Atherosclerotic heart disease of native coronary artery without angina pectoris: Secondary | ICD-10-CM | POA: Diagnosis not present

## 2020-11-23 DIAGNOSIS — L97521 Non-pressure chronic ulcer of other part of left foot limited to breakdown of skin: Secondary | ICD-10-CM | POA: Diagnosis not present

## 2020-11-23 DIAGNOSIS — I70245 Atherosclerosis of native arteries of left leg with ulceration of other part of foot: Secondary | ICD-10-CM | POA: Diagnosis not present

## 2020-11-23 DIAGNOSIS — S80822A Blister (nonthermal), left lower leg, initial encounter: Secondary | ICD-10-CM | POA: Diagnosis not present

## 2020-11-23 DIAGNOSIS — L97519 Non-pressure chronic ulcer of other part of right foot with unspecified severity: Secondary | ICD-10-CM | POA: Diagnosis not present

## 2020-11-23 DIAGNOSIS — L97522 Non-pressure chronic ulcer of other part of left foot with fat layer exposed: Secondary | ICD-10-CM | POA: Diagnosis not present

## 2020-11-25 DIAGNOSIS — L97519 Non-pressure chronic ulcer of other part of right foot with unspecified severity: Secondary | ICD-10-CM | POA: Diagnosis not present

## 2020-12-02 DIAGNOSIS — L97522 Non-pressure chronic ulcer of other part of left foot with fat layer exposed: Secondary | ICD-10-CM | POA: Diagnosis not present

## 2020-12-02 DIAGNOSIS — E1142 Type 2 diabetes mellitus with diabetic polyneuropathy: Secondary | ICD-10-CM | POA: Diagnosis not present

## 2020-12-07 DIAGNOSIS — L97522 Non-pressure chronic ulcer of other part of left foot with fat layer exposed: Secondary | ICD-10-CM | POA: Diagnosis not present

## 2020-12-07 DIAGNOSIS — E1142 Type 2 diabetes mellitus with diabetic polyneuropathy: Secondary | ICD-10-CM | POA: Diagnosis not present

## 2020-12-07 DIAGNOSIS — I739 Peripheral vascular disease, unspecified: Secondary | ICD-10-CM | POA: Diagnosis not present

## 2020-12-07 DIAGNOSIS — I70245 Atherosclerosis of native arteries of left leg with ulceration of other part of foot: Secondary | ICD-10-CM | POA: Diagnosis not present

## 2020-12-19 DIAGNOSIS — E1142 Type 2 diabetes mellitus with diabetic polyneuropathy: Secondary | ICD-10-CM | POA: Diagnosis not present

## 2020-12-19 DIAGNOSIS — Z794 Long term (current) use of insulin: Secondary | ICD-10-CM | POA: Diagnosis not present

## 2020-12-19 DIAGNOSIS — E1169 Type 2 diabetes mellitus with other specified complication: Secondary | ICD-10-CM | POA: Diagnosis not present

## 2020-12-19 DIAGNOSIS — E1159 Type 2 diabetes mellitus with other circulatory complications: Secondary | ICD-10-CM | POA: Diagnosis not present

## 2020-12-21 DIAGNOSIS — I70245 Atherosclerosis of native arteries of left leg with ulceration of other part of foot: Secondary | ICD-10-CM | POA: Diagnosis not present

## 2020-12-21 DIAGNOSIS — I739 Peripheral vascular disease, unspecified: Secondary | ICD-10-CM | POA: Diagnosis not present

## 2020-12-21 DIAGNOSIS — L97522 Non-pressure chronic ulcer of other part of left foot with fat layer exposed: Secondary | ICD-10-CM | POA: Diagnosis not present

## 2020-12-21 DIAGNOSIS — E1142 Type 2 diabetes mellitus with diabetic polyneuropathy: Secondary | ICD-10-CM | POA: Diagnosis not present

## 2020-12-28 ENCOUNTER — Other Ambulatory Visit: Payer: Self-pay | Admitting: Internal Medicine

## 2020-12-28 ENCOUNTER — Other Ambulatory Visit: Payer: Self-pay

## 2020-12-28 ENCOUNTER — Ambulatory Visit
Admission: RE | Admit: 2020-12-28 | Discharge: 2020-12-28 | Disposition: A | Discharge status: Home / Self Care | Payer: Medicare Other | Source: Ambulatory Visit | Attending: Internal Medicine | Admitting: Internal Medicine

## 2020-12-28 ENCOUNTER — Ambulatory Visit
Admission: RE | Admit: 2020-12-28 | Discharge: 2020-12-28 | Disposition: A | Discharge status: Home / Self Care | Payer: Medicare Other | Attending: Internal Medicine | Admitting: Internal Medicine

## 2020-12-28 ENCOUNTER — Encounter: Payer: Medicare Other | Attending: Internal Medicine | Admitting: Internal Medicine

## 2020-12-28 DIAGNOSIS — I890 Lymphedema, not elsewhere classified: Secondary | ICD-10-CM | POA: Diagnosis not present

## 2020-12-28 DIAGNOSIS — S91302A Unspecified open wound, left foot, initial encounter: Secondary | ICD-10-CM

## 2020-12-28 DIAGNOSIS — S91104A Unspecified open wound of right lesser toe(s) without damage to nail, initial encounter: Secondary | ICD-10-CM | POA: Diagnosis not present

## 2020-12-28 DIAGNOSIS — X58XXXA Exposure to other specified factors, initial encounter: Secondary | ICD-10-CM | POA: Insufficient documentation

## 2020-12-28 DIAGNOSIS — L97529 Non-pressure chronic ulcer of other part of left foot with unspecified severity: Secondary | ICD-10-CM

## 2020-12-28 DIAGNOSIS — E11621 Type 2 diabetes mellitus with foot ulcer: Secondary | ICD-10-CM | POA: Diagnosis not present

## 2020-12-28 DIAGNOSIS — Z87891 Personal history of nicotine dependence: Secondary | ICD-10-CM | POA: Diagnosis not present

## 2020-12-28 DIAGNOSIS — L89899 Pressure ulcer of other site, unspecified stage: Secondary | ICD-10-CM | POA: Insufficient documentation

## 2020-12-28 DIAGNOSIS — E08621 Diabetes mellitus due to underlying condition with foot ulcer: Secondary | ICD-10-CM | POA: Diagnosis not present

## 2020-12-28 DIAGNOSIS — E1151 Type 2 diabetes mellitus with diabetic peripheral angiopathy without gangrene: Secondary | ICD-10-CM | POA: Diagnosis not present

## 2020-12-28 DIAGNOSIS — S81801A Unspecified open wound, right lower leg, initial encounter: Secondary | ICD-10-CM | POA: Insufficient documentation

## 2020-12-28 DIAGNOSIS — Z89429 Acquired absence of other toe(s), unspecified side: Secondary | ICD-10-CM

## 2020-12-28 DIAGNOSIS — M7989 Other specified soft tissue disorders: Secondary | ICD-10-CM | POA: Diagnosis not present

## 2020-12-28 DIAGNOSIS — S81802A Unspecified open wound, left lower leg, initial encounter: Secondary | ICD-10-CM | POA: Diagnosis not present

## 2020-12-28 DIAGNOSIS — I872 Venous insufficiency (chronic) (peripheral): Secondary | ICD-10-CM | POA: Diagnosis not present

## 2020-12-28 NOTE — Progress Notes (Signed)
DONLD, LUEBBE ([id number]) Visit Report for 12/28/2020 Abuse/Suicide Risk Screen Details Patient Name: RASON, PASLEY A. Date of Service: 12/28/2020 9:45 AM Medical Record Number: [id number] Patient Account Number: [id number] Date of Birth/Sex: Oct 22, 1955 (65 y.o. M) Treating RN: Donnamarie Poag Primary Care Noelle Sease: Fulton Reek Other Clinician: Referring Keimon Basaldua: Caroline More Treating Errin Chewning/Extender: Yaakov Guthrie in Treatment: 0 Abuse/Suicide Risk Screen Items Answer ABUSE RISK SCREEN: Has anyone close to you tried to hurt or harm you recentlyo No Do you feel uncomfortable with anyone in your familyo No Has anyone forced you do things that you didnot want to doo No Electronic Signature(s) Signed: 12/28/2020 3:14:25 PM By: Donnamarie Poag Entered By: Donnamarie Poag on 12/28/2020 10:16:04 Montfort, Nanine Means ([id number]) -------------------------------------------------------------------------------- Activities of Daily Living Details Patient Name: Rosenbach, Kaye A. Date of Service: 12/28/2020 9:45 AM Medical Record Number: [id number] Patient Account Number: [id number] Date of Birth/Sex: [birth year] (65 y.o. M) Treating RN: Donnamarie Poag Primary Care Damonie Furney: Fulton Reek Other Clinician: Referring Leonila Speranza: Caroline More Treating Majed Pellegrin/Extender: Yaakov Guthrie in Treatment: 0 Activities of Daily Living Items Answer Activities of Daily Living (Please select one for each item) Drive Automobile Completely Able Take Medications Completely Able Use Telephone Completely Able Care for Appearance Completely Able Use Toilet Completely Able Bath / Shower Completely Able Dress Self Completely Able Feed Self Completely Able Walk Completely Able Get In / Out Bed Completely Able Housework Need Assistance Prepare Meals Need Assistance Handle Money Completely Able Shop for Self Need Assistance Electronic Signature(s) Signed: 12/28/2020 3:14:25 PM By: Donnamarie Poag Entered By: Donnamarie Poag on 12/28/2020 09:59:31 Mcwatters, Nanine Means ([id number]) -------------------------------------------------------------------------------- Education Screening Details Patient Name: Neidig, Derryl A. Date of Service: 12/28/2020 9:45 AM Medical Record Number: [id number] Patient Account Number: [id number] Date of Birth/Sex: April 07, 1956 (65 y.o. M) Treating RN: Donnamarie Poag Primary Care Kerria Sapien: Fulton Reek Other Clinician: Referring Criss Bartles: Caroline More Treating Satsuki Zillmer/Extender: Yaakov Guthrie in Treatment: 0 Primary Learner Assessed: Patient Learning Preferences/Education Level/Primary Language Learning Preference: Explanation Highest Education Level: College or Above Preferred Language: English Cognitive Barrier Language Barrier: No Translator Needed: No Memory Deficit: No Emotional Barrier: No Cultural/Religious Beliefs Affecting Medical Care: No Physical Barrier Impaired Vision: No Impaired Hearing: No Decreased Hand dexterity: No Knowledge/Comprehension Knowledge Level: High Comprehension Level: High Ability to understand written instructions: High Ability to understand verbal instructions: High Motivation Anxiety Level: Calm Cooperation: Cooperative Education Importance: Acknowledges Need Interest in Health Problems: Asks Questions Perception: Coherent Willingness to Engage in Self-Management High Activities: Readiness to Engage in Self-Management High Activities: Electronic Signature(s) Signed: 12/28/2020 3:14:25 PM By: Donnamarie Poag Entered ByDonnamarie Poag on 12/28/2020 09:59:56 Pomeroy, Nanine Means ([id number]) -------------------------------------------------------------------------------- Fall Risk Assessment Details Patient Name: Deardorff, Mindy A. Date of Service: 12/28/2020 9:45 AM Medical Record Number: [id number] Patient Account Number: [id number] Date of Birth/Sex: March 29, 1956 (65 y.o. M) Treating RN: Donnamarie Poag Primary Care Jazilyn Siegenthaler: Fulton Reek Other Clinician: Referring Rettie Laird: Caroline More Treating Keishana Klinger/Extender: Yaakov Guthrie in Treatment: 0 Fall Risk Assessment Items Have you had 2 or more falls in the last 12 monthso 0 No Have you had any fall that resulted in injury in the last 12 monthso 0 No FALLS RISK SCREEN History of falling - immediate or within 3 months 0 No Secondary diagnosis (Do you have 2 or more medical diagnoseso) 0 No Ambulatory aid None/bed rest/wheelchair/nurse 0 Yes Crutches/cane/walker 0 No Furniture 0 No Intravenous therapy Access/Saline/Heparin Lock 0 No Gait/Transferring Normal/ bed rest/ wheelchair 0 Yes Weak (short steps with or without shuffle,  stooped but able to lift head while walking, may 0 No seek support from furniture) Impaired (short steps with shuffle, may have difficulty arising from chair, head down, impaired 0 No balance) Mental Status Oriented to own ability 0 Yes Electronic Signature(s) Signed: 12/28/2020 3:14:25 PM By: Donnamarie Poag Entered By: Donnamarie Poag on 12/28/2020 10:00:24 Dicker, Nanine Means ([id number]) -------------------------------------------------------------------------------- Foot Assessment Details Patient Name: Boehle, Adelbert A. Date of Service: 12/28/2020 9:45 AM Medical Record Number: [id number] Patient Account Number: [id number] Date of Birth/Sex: [birth year] (65 y.o. M) Treating RN: Donnamarie Poag Primary Care Shantese Raven: Fulton Reek Other Clinician: Referring Natanael Saladin: Caroline More Treating Obrien Huskins/Extender: Yaakov Guthrie in Treatment: 0 Foot Assessment Items Site Locations + = Sensation present, - = Sensation absent, C = Callus, U = Ulcer R = Redness, W = Warmth, M = Maceration, PU = Pre-ulcerative lesion F = Fissure, S = Swelling, D = Dryness Assessment Right: Left: Other Deformity: No No Prior Foot Ulcer: No No Prior Amputation: Yes Yes Charcot Joint: No No Ambulatory  Status: Ambulatory Without Help Gait: Steady Electronic Signature(s) Signed: 12/28/2020 3:14:25 PM By: Donnamarie Poag Entered By: Donnamarie Poag on 12/28/2020 10:12:18 Soy, Nanine Means ([id number]) -------------------------------------------------------------------------------- Nutrition Risk Screening Details Patient Name: Covin, Samarth A. Date of Service: 12/28/2020 9:45 AM Medical Record Number: [id number] Patient Account Number: [id number] Date of Birth/Sex: [birth year] (65 y.o. M) Treating RN: Donnamarie Poag Primary Care Osborne Serio: Fulton Reek Other Clinician: Referring Aleiah Mohammed: Caroline More Treating Davaun Quintela/Extender: Yaakov Guthrie in Treatment: 0 Height (in): 74.5 Weight (lbs): 363 Body Mass Index (BMI): 46 Nutrition Risk Screening Items Score Screening NUTRITION RISK SCREEN: I have an illness or condition that made me change the kind and/or amount of food I eat 0 No I eat fewer than two meals per day 0 No I eat few fruits and vegetables, or milk products 0 No I have three or more drinks of beer, liquor or wine almost every day 0 No I have tooth or mouth problems that make it hard for me to eat 0 No I don't always have enough money to buy the food I need 0 No I eat alone most of the time 0 No I take three or more different prescribed or over-the-counter drugs a day 1 Yes Without wanting to, I have lost or gained 10 pounds in the last six months 2 Yes I am not always physically able to shop, cook and/or feed myself 0 No Nutrition Protocols Good Risk Protocol Moderate Risk Protocol 0 Provide education on nutrition High Risk Proctocol Risk Level: Moderate Risk Score: 3 Electronic Signature(s) Signed: 12/28/2020 3:14:25 PM By: Donnamarie Poag Entered By: Donnamarie Poag on 12/28/2020 10:01:45

## 2020-12-29 DIAGNOSIS — I10 Essential (primary) hypertension: Secondary | ICD-10-CM | POA: Diagnosis not present

## 2020-12-29 DIAGNOSIS — I2510 Atherosclerotic heart disease of native coronary artery without angina pectoris: Secondary | ICD-10-CM | POA: Diagnosis not present

## 2020-12-29 DIAGNOSIS — G4733 Obstructive sleep apnea (adult) (pediatric): Secondary | ICD-10-CM | POA: Diagnosis not present

## 2020-12-29 DIAGNOSIS — I25710 Atherosclerosis of autologous vein coronary artery bypass graft(s) with unstable angina pectoris: Secondary | ICD-10-CM | POA: Diagnosis not present

## 2020-12-29 NOTE — Progress Notes (Signed)
[name] ([id number]) Visit Report for 12/28/2020 Chief Complaint Document Details Patient Name: [name], [name] Date of Service: 12/28/2020 9:45 AM Medical Record Number: [id number] Patient Account Number: [id number] Date of Birth/Sex: Nov 12, 1955 (65 y.o. M) Treating RN: Donnamarie Poag Primary Care Provider: Fulton Reek Other Clinician: Referring Provider: Caroline More Treating Provider/Extender: Yaakov Guthrie in Treatment: 0 Information Obtained from: Patient Chief Complaint 8/31; Multiple scattered open wounds to his lower extremities bilaterally. Left plantar foot wound Electronic Signature(s) Signed: 12/29/2020 4:07:23 PM By: Kalman Shan DO Entered By: Kalman Shan on 12/29/2020 12:56:04 [name], [name] ([id number]) -------------------------------------------------------------------------------- HPI Details Patient Name: [name], [name] Date of Service: 12/28/2020 9:45 AM Medical Record Number: [id number] Patient Account Number: [id number] Date of Birth/Sex: April 15, 1956 (65 y.o. M) Treating RN: Donnamarie Poag Primary Care Provider: Fulton Reek Other Clinician: Referring Provider: Caroline More Treating Provider/Extender: Yaakov Guthrie in Treatment: 0 History of Present Illness HPI Description: Admission 8/31 [name] is a 65 year old male with a past medical history of uncontrolled insulin-dependent type 2 diabetes, CAD, osteomyelitis to the left third toe status post partial third ray amputation, and peripheral arterial disease that presents for a left plantar foot wound and bilateral lower extremity wounds. The left plantar foot wound has been present for at least 7 months. He has been following podiatry for this issue. He uses silver collagen to this area. He reports that in the past 1 to 2 months he has developed multiple scattered open wounds to his lower extremities bilaterally that have worsened over time. He states  that they often develop as either blisters or he hits his leg against an object.He is currently using Betadine to these areas. He denies signs of infection. He has compression stockings however he cannot put these on very easily. Also he is on his feet most of the day. He states he does not have anyone to help him do the activities he needs to get done. Electronic Signature(s) Signed: 12/29/2020 4:07:23 PM By: Kalman Shan DO Entered By: Kalman Shan on 12/29/2020 16:00:08 [name] ([id number]) -------------------------------------------------------------------------------- Physical Exam Details Patient Name: [name], [name] Date of Service: 12/28/2020 9:45 AM Medical Record Number: [id number] Patient Account Number: [id number] Date of Birth/Sex: 11/14/1955 (64 y.o. M) Treating RN: Donnamarie Poag Primary Care Provider: Fulton Reek Other Clinician: Referring Provider: Caroline More Treating Provider/Extender: Yaakov Guthrie in Treatment: 0 Constitutional . Cardiovascular . Psychiatric . Notes Left foot: To the plantar aspect there is an open wound with nonviable tissue and granulation tissue present. He has multiple scattered wounds throughout his legs bilaterally that are either due from trauma or blister rupture. Granulation tissue throughout except for the right foot the wounds have slough or scabs. Electronic Signature(s) Signed: 12/29/2020 4:07:23 PM By: Kalman Shan DO Entered By: Kalman Shan on 12/29/2020 16:00:18 [name] ([id number]) -------------------------------------------------------------------------------- Physician Orders Details Patient Name: Alec Schooner A. Date of Service: 12/28/2020 9:45 AM Medical Record Number: [id number] Patient Account Number: [id number] Date of Birth/Sex: 1955/09/12 (65 y.o. M) Treating RN: Donnamarie Poag Primary Care Provider: Fulton Reek Other Clinician: Referring Provider: Caroline More Treating Provider/Extender: Yaakov Guthrie in Treatment: 0 Verbal / Phone Orders: No Diagnosis Coding Follow-up Appointments o Return Appointment in 1 week. o Nurse Visit as needed - NURSE VISIT WEEKLY TO ALLOW WRAPPED [place] o ADMIT to [place] for wound care. May utilize formulary equivalent dressing for wound treatment orders unless otherwise specified. Home Health Nurse may visit PRN to address  patientos wound care needs. - REFERRAL SENT TODAY o Scheduled days for dressing changes to be completed; exception, patient has scheduled wound care visit that day. o **Please direct any NON-WOUND related issues/requests for orders to patient's Primary Care Physician. **If current dressing causes regression in wound condition, may D/C ordered dressing product/s and apply Normal Saline Moist Dressing daily until next [place] or Other MD appointment. **Notify [facility] of regression in wound condition at [phone]. Bathing/ Shower/ Hygiene o May shower with wound dressing protected with water repellent cover or cast protector. o No tub bath. Edema Control - Lymphedema / Segmental Compressive Device / Other Bilateral Lower Extremities o Optional: One layer of unna paste to top of compression wrap (to act as an anchor). o Patient to wear own Velcro compression garment. Remove compression stockings every night before going to bed and put on every morning when getting up. - oRDERING JUXTELITE TODAY o Elevate legs to the level of the heart and pump ankles as often as possible o Elevate leg(s) parallel to the floor when sitting. o DO YOUR BEST to sleep in the bed at night. DO NOT sleep in your recliner. Long hours of sitting in a recliner leads to swelling of the legs and/or potential wounds on your backside. Off-Loading o Open toe surgical shoe with peg assist. Additional Orders / Instructions o Follow  Nutritious Diet and Increase Protein Intake Wound Treatment Wound #1 - Foot Wound Laterality: Plantar, Left Cleanser: Soap and Water 2 x Per Week/30 Days Discharge Instructions: Gently cleanse wound with antibacterial soap, rinse and pat dry prior to dressing wounds Primary Dressing: Silvercel 4 1/4x 4 1/4 (in/in) 2 x Per Week/30 Days Discharge Instructions: Apply Silvercel 4 1/4x 4 1/4 (in/in) as instructed Secondary Dressing: Zetuvit Absorbent Pad, 4x4 (in/in) 2 x Per Week/30 Days Discharge Instructions: ABSORBANT DRESSING OR ALTERNATE SUB Compression Wrap: Profore Lite LF 3 Multilayer Compression Bandaging System 2 x Per Week/30 Days Discharge Instructions: Apply 3 multi-layer wrap as prescribed. Wound #2 - Lower Leg Wound Laterality: Left, Midline, Distal Cleanser: Soap and Water 2 x Per Week/30 Days Discharge Instructions: Gently cleanse wound with antibacterial soap, rinse and pat dry prior to dressing wounds Cleanser: Wound Cleanser 2 x Per Week/30 Days Discharge Instructions: Wash your hands with soap and water. Remove old dressing, discard into plastic bag and place into trash. Cleanse the wound with Wound Cleanser prior to applying a clean dressing using gauze sponges, not tissues or cotton balls. Do not scrub or use excessive force. Pat dry using gauze sponges, not tissue or cotton balls. Primary Dressing: Silvercel 4 1/4x 4 1/4 (in/in) 2 x Per Week/30 Days [name], [name] ([id number]) Discharge Instructions: Apply Silvercel 4 1/4x 4 1/4 (in/in) as instructed Secondary Dressing: Zetuvit Absorbent Pad, 4x4 (in/in) 2 x Per Week/30 Days Discharge Instructions: ABSORBANT DRESSING OR ALTERNATE SUB Compression Wrap: Profore Lite LF 3 Multilayer Compression Bandaging System 2 x Per Week/30 Days Discharge Instructions: Apply 3 multi-layer wrap as prescribed. Wound #3 - Lower Leg Wound Laterality: Left, Medial, Distal Cleanser: Soap and Water 2 x Per Week/30 Days Discharge  Instructions: Gently cleanse wound with antibacterial soap, rinse and pat dry prior to dressing wounds Cleanser: Wound Cleanser 2 x Per Week/30 Days Discharge Instructions: Wash your hands with soap and water. Remove old dressing, discard into plastic bag and place into trash. Cleanse the wound with Wound Cleanser prior to applying a clean dressing using gauze sponges, not tissues or cotton balls. Do not  scrub or use excessive force. Pat dry using gauze sponges, not tissue or cotton balls. Primary Dressing: Silvercel 4 1/4x 4 1/4 (in/in) 2 x Per Week/30 Days Discharge Instructions: Apply Silvercel 4 1/4x 4 1/4 (in/in) as instructed Secondary Dressing: Zetuvit Absorbent Pad, 4x4 (in/in) 2 x Per Week/30 Days Discharge Instructions: ABSORBANT DRESSING OR ALTERNATE SUB Compression Wrap: Profore Lite LF 3 Multilayer Compression Bandaging System 2 x Per Week/30 Days Discharge Instructions: Apply 3 multi-layer wrap as prescribed. Compression Stockings: Circaid Juxta Lite Compression Wrap Left Leg Compression Amount: 30-40 mmHG Discharge Instructions: Apply Circaid Juxta Lite Compression Wrap as directed Wound #4 - Lower Leg Wound Laterality: Right, Circumferential Cleanser: Soap and Water 2 x Per Week/30 Days Discharge Instructions: Gently cleanse wound with antibacterial soap, rinse and pat dry prior to dressing wounds Cleanser: Wound Cleanser 2 x Per Week/30 Days Discharge Instructions: Wash your hands with soap and water. Remove old dressing, discard into plastic bag and place into trash. Cleanse the wound with Wound Cleanser prior to applying a clean dressing using gauze sponges, not tissues or cotton balls. Do not scrub or use excessive force. Pat dry using gauze sponges, not tissue or cotton balls. Primary Dressing: Silvercel 4 1/4x 4 1/4 (in/in) 2 x Per Week/30 Days Discharge Instructions: Apply Silvercel 4 1/4x 4 1/4 (in/in) as instructed Secondary Dressing: Zetuvit Absorbent Pad, 4x4 (in/in)  2 x Per Week/30 Days Discharge Instructions: ABSORBANT DRESSING OR ALTERNATE SUB Compression Wrap: Profore Lite LF 3 Multilayer Compression Bandaging System 2 x Per Week/30 Days Discharge Instructions: Apply 3 multi-layer wrap as prescribed. Compression Stockings: Circaid Juxta Lite Compression Wrap (DME) Right Leg Compression Amount: 30-40 mmHG Discharge Instructions: Apply Circaid Juxta Lite Compression Wrap as directed Wound #5 - Toe Fifth Wound Laterality: Right, Anterior Cleanser: Soap and Water 2 x Per Week/30 Days Discharge Instructions: Gently cleanse wound with antibacterial soap, rinse and pat dry prior to dressing wounds Cleanser: Wound Cleanser 2 x Per Week/30 Days Discharge Instructions: Wash your hands with soap and water. Remove old dressing, discard into plastic bag and place into trash. Cleanse the wound with Wound Cleanser prior to applying a clean dressing using gauze sponges, not tissues or cotton balls. Do not scrub or use excessive force. Pat dry using gauze sponges, not tissue or cotton balls. Primary Dressing: Silvercel 4 1/4x 4 1/4 (in/in) 2 x Per Week/30 Days Discharge Instructions: Apply Silvercel 4 1/4x 4 1/4 (in/in) as instructed Secondary Dressing: Zetuvit Absorbent Pad, 4x4 (in/in) 2 x Per Week/30 Days Discharge Instructions: ABSORBANT DRESSING OR ALTERNATE SUB [name], [name] ([id number]) Compression Wrap: Profore Lite LF 3 Multilayer Compression Bandaging System 2 x Per Week/30 Days Discharge Instructions: Apply 3 multi-layer wrap as prescribed. Wound #6 - Knee Wound Laterality: Right, Anterior Cleanser: Soap and Water 2 x Per Week/30 Days Discharge Instructions: Gently cleanse wound with antibacterial soap, rinse and pat dry prior to dressing wounds Cleanser: Wound Cleanser 2 x Per Week/30 Days Discharge Instructions: Wash your hands with soap and water. Remove old dressing, discard into plastic bag and place into trash. Cleanse the wound with Wound  Cleanser prior to applying a clean dressing using gauze sponges, not tissues or cotton balls. Do not scrub or use excessive force. Pat dry using gauze sponges, not tissue or cotton balls. Primary Dressing: Silvercel 4 1/4x 4 1/4 (in/in) 2 x Per Week/30 Days Discharge Instructions: Apply Silvercel 4 1/4x 4 1/4 (in/in) as instructed Secondary Dressing: Zetuvit Absorbent Pad, 4x4 (in/in) 2 x Per Week/30 Days Discharge Instructions:  ABSORBANT DRESSING OR ALTERNATE SUB Compression Wrap: Profore Lite LF 3 Multilayer Compression Bandaging System 2 x Per Week/30 Days Discharge Instructions: Apply 3 multi-layer wrap as prescribed. Radiology o X-ray, foot - take your xray order and [facility] of your left foot today Electronic Signature(s) Signed: 12/29/2020 8:10:35 AM By: Donnamarie Poag Signed: 12/29/2020 4:07:23 PM By: Kalman Shan DO Previous Signature: 12/28/2020 3:14:25 PM Version By: Donnamarie Poag Entered By: Donnamarie Poag on 12/29/2020 08:08:50 Binney, [name] ([id number]) -------------------------------------------------------------------------------- Problem List Details Patient Name: Mino, Can A. Date of Service: 12/28/2020 9:45 AM Medical Record Number: [id number] Patient Account Number: [id number] Date of Birth/Sex: [birth year] (65 y.o. M) Treating RN: Donnamarie Poag Primary Care Provider: Fulton Reek Other Clinician: Referring Provider: Caroline More Treating Provider/Extender: Yaakov Guthrie in Treatment: 0 Active Problems ICD-10 Encounter Code Description Active Date MDM Diagnosis S91.302A Unspecified open wound, left foot, initial encounter 12/28/2020 No Yes E11.621 Type 2 diabetes mellitus with foot ulcer 12/28/2020 No Yes S81.802A Unspecified open wound, left lower leg, initial encounter 12/28/2020 No Yes S81.801A Unspecified open wound, right lower leg, initial encounter 12/28/2020 No Yes I87.2 Venous insufficiency (chronic) (peripheral)  12/28/2020 No Yes I73.9 Peripheral vascular disease, unspecified 12/28/2020 No Yes Inactive Problems Resolved Problems Electronic Signature(s) Signed: 12/29/2020 4:07:23 PM By: Kalman Shan DO Entered By: Kalman Shan on 12/29/2020 12:54:37 Osika, [name] ([id number]) -------------------------------------------------------------------------------- Progress Note Details Patient Name: [name], [name] Date of Service: 12/28/2020 9:45 AM Medical Record Number: [id number] Patient Account Number: [id number] Date of Birth/Sex: 05-16-1955 (65 y.o. M) Treating RN: Donnamarie Poag Primary Care Provider: Fulton Reek Other Clinician: Referring Provider: Caroline More Treating Provider/Extender: Yaakov Guthrie in Treatment: 0 Subjective Chief Complaint Information obtained from Patient 8/31; Multiple scattered open wounds to his lower extremities bilaterally. Left plantar foot wound History of Present Illness (HPI) Admission 8/31 Mr. garnell exner is a 65 year old male with a past medical history of uncontrolled insulin-dependent type 2 diabetes, CAD, osteomyelitis to the left third toe status post partial third ray amputation, and peripheral arterial disease that presents for a left plantar foot wound and bilateral lower extremity wounds. The left plantar foot wound has been present for at least 7 months. He has been following podiatry for this issue. He uses silver collagen to this area. He reports that in the past 1 to 2 months he has developed multiple scattered open wounds to his lower extremities bilaterally that have worsened over time. He states that they often develop as either blisters or he hits his leg against an object.He is currently using Betadine to these areas. He denies signs of infection. He has compression stockings however he cannot put these on very easily. Also he is on his feet most of the day. He states he does not have anyone to help him do the  activities he needs to get done. Patient History Information obtained from Patient. Allergies No Known Allergies Social History Former smoker - ended on 05/01/2015, Marital Status - Married, Alcohol Use - Rarely, Drug Use - Prior History, Caffeine Use - Daily. Medical History Respiratory Patient has history of Asthma, Chronic Obstructive Pulmonary Disease (COPD), Sleep Apnea Cardiovascular Patient has history of Coronary Artery Disease, Hypertension, Myocardial Infarction, Peripheral Venous Disease Gastrointestinal Patient has history of Colitis - IBS Endocrine Patient has history of Type II Diabetes Musculoskeletal Patient has history of Gout, Osteoarthritis Patient is treated with Insulin, Oral Agents. Blood sugar is tested. Review of Systems (ROS) Constitutional Symptoms (General Health) Denies complaints  or symptoms of Fatigue, Fever, Chills, Marked Weight Change. Eyes Denies complaints or symptoms of Dry Eyes, Vision Changes, Glasses / Contacts. Ear/Nose/Mouth/Throat Denies complaints or symptoms of Difficult clearing ears, Sinusitis. Hematologic/Lymphatic Denies complaints or symptoms of Bleeding / Clotting Disorders, Human Immunodeficiency Virus. Respiratory Complains or has symptoms of Shortness of Breath. Cardiovascular Complains or has symptoms of LE edema. Gastrointestinal Denies complaints or symptoms of Frequent diarrhea, Nausea, Vomiting. Genitourinary Denies complaints or symptoms of Kidney failure/ Dialysis, Incontinence/dribbling. Immunological Denies complaints or symptoms of Hives, Itching. Integumentary (Skin) Complains or has symptoms of Bleeding or bruising tendency, Swelling. Denies complaints or symptoms of Wounds, Breakdown. Neurologic Denies complaints or symptoms of Numbness/parasthesias, Focal/Weakness. Psychiatric Complains or has symptoms of Anxiety, DX DIAGNOSIS DEPRESSION [name], [name] ([id number]) Objective Constitutional Vitals Time  Taken: 9:55 AM, Height: 74.5 in, Source: Stated, Weight: 363 lbs, Source: Measured, BMI: 46, Temperature: 98.4 F, Pulse: 83 bpm, Respiratory Rate: 18 breaths/min, Blood Pressure: 168/81 mmHg. General Notes: Left foot: To the plantar aspect there is an open wound with nonviable tissue and granulation tissue present. He has multiple scattered wounds throughout his legs bilaterally that are either due from trauma or blister rupture. Granulation tissue throughout except for the right foot the wounds have slough or scabs. Integumentary (Hair, Skin) Wound #1 status is Open. Original cause of wound was Pressure Injury. The date acquired was: 05/14/2020. The wound is located on the [place]. The wound measures 0.8cm length x 0.9cm width x 0.5cm depth; 0.565cm^2 area and 0.283cm^3 volume. There is Fat Layer (Subcutaneous Tissue) exposed. There is no tunneling or undermining noted. There is a medium amount of serosanguineous drainage noted. There is medium (34-66%) pink granulation within the wound bed. There is a medium (34-66%) amount of necrotic tissue within the wound bed including Adherent Slough. Wound #2 status is Open. Original cause of wound was Trauma. The date acquired was: 11/30/2020. The wound is located on the Left,Distal,Midline Lower Leg. The wound measures 8cm length x 7cm width x 0.1cm depth; 43.982cm^2 area and 4.398cm^3 volume. There is Fat Layer (Subcutaneous Tissue) exposed. There is no tunneling or undermining noted. There is a medium amount of serosanguineous drainage noted. There is large (67-100%) red granulation within the wound bed. There is a small (1-33%) amount of necrotic tissue within the wound bed including Adherent Slough. Wound #3 status is Open. Original cause of wound was Shear/Friction. The date acquired was: 12/18/2020. The wound is located on the Left,Distal,Medial Lower Leg. The wound measures 3.5cm length x 4cm width x 0.1cm depth; 10.996cm^2 area and 1.1cm^3  volume. There is Fat Layer (Subcutaneous Tissue) exposed. There is no tunneling or undermining noted. There is a medium amount of serosanguineous drainage noted. There is large (67-100%) granulation within the wound bed. There is a small (1-33%) amount of necrotic tissue within the wound bed including Adherent Slough. Wound #4 status is Open. Original cause of wound was Blister. The date acquired was: 11/11/2020. The wound is located on the Right,Circumferential Lower Leg. The wound measures 16cm length x 9cm width x 0.1cm depth; 113.097cm^2 area and 11.31cm^3 volume. There is Fat Layer (Subcutaneous Tissue) exposed. There is a large amount of serous drainage noted. There is medium (34-66%) red, pink granulation within the wound bed. There is a medium (34-66%) amount of necrotic tissue within the wound bed including Eschar and Adherent Slough. Wound #5 status is Open. Original cause of wound was Shear/Friction. The date acquired was: 12/23/2020. The wound is located on the Right,Anterior Toe  Fifth. The wound measures 0.7cm length x 0.4cm width x 0.1cm depth; 0.22cm^2 area and 0.022cm^3 volume. There is Fat Layer (Subcutaneous Tissue) exposed. There is no tunneling or undermining noted. There is a medium amount of serosanguineous drainage noted. There is no granulation within the wound bed. There is a large (67-100%) amount of necrotic tissue within the wound bed including Eschar. Wound #6 status is Open. Original cause of wound was Skin Tear/Laceration. The date acquired was: 11/30/2020. The wound is located on the Right,Anterior Knee. The wound measures 1.3cm length x 1.9cm width x 0.1cm depth; 1.94cm^2 area and 0.194cm^3 volume. There is Fat Layer (Subcutaneous Tissue) exposed. There is no tunneling or undermining noted. There is a medium amount of serosanguineous drainage noted. There is no granulation within the wound bed. There is a large (67-100%) amount of necrotic tissue within the wound bed  including Eschar. Assessment Active Problems ICD-10 Unspecified open wound, left foot, initial encounter Type 2 diabetes mellitus with foot ulcer Unspecified open wound, left lower leg, initial encounter Unspecified open wound, right lower leg, initial encounter Venous insufficiency (chronic) (peripheral) Peripheral vascular disease, unspecified Patient presents with multiple wounds. The left plantar foot wound has been followed by podiatry. With a 25-monthhistory of nonhealing wound I would like to obtain an x-ray of this foot. For now I recommended using silver alginate. He does have weeping of his legs bilaterally on exam and [name], [name] ([id number] would benefit from compression therapy. His last ABIs were 0.88 on the right and 1.0 on the left. I recommended starting with Kerlix/Coban because he states that he has discomfort with compression wrapping. We will also order juxta light compressions bilaterally since he has a difficult time putting on regular compression stockings. He currently denies signs of infection. I recommended silver alginate to the leg wounds. He has a couple trauma wounds to his knee and foot. We will put silver alginate on these as well. We will have close follow-up with a nurse visit at the end of this week to assure progress. No obvious signs of infection on exam today. 50 minutes was spent on the encounter including face-to-face, EMR review and coordination of care. Procedures Wound #2 Pre-procedure diagnosis of Wound #2 is a Lymphedema located on the Left,Distal,Midline Lower Leg . There was a Three Layer Compression Therapy Procedure by BDonnamarie Poag RN. Post procedure Diagnosis Wound #2: Same as Pre-Procedure Wound #4 Pre-procedure diagnosis of Wound #4 is a Lymphedema located on the Right,Circumferential Lower Leg . There was a Three Layer Compression Therapy Procedure by BDonnamarie Poag RN. Post procedure Diagnosis Wound #4: Same as  Pre-Procedure Plan Follow-up Appointments: Return Appointment in 1 week. Nurse Visit as needed - NURSE VISIT WEEKLY TO ALLOW WRAPPED TWICE WEEKLY Home Health: [facility]. May utilize formulary equivalent dressing for wound treatment orders unless otherwise specified. Home Health Nurse may visit PRN to address patient s wound care needs. - REFERRAL SENT TODAY Scheduled days for dressing changes to be completed; exception, patient has scheduled wound care visit that day. **Please direct any NON-WOUND related issues/requests for orders to patient's Primary Care Physician. **If current dressing causes regression in wound condition, may D/C ordered dressing product/s and apply Normal Saline Moist Dressing daily until next [facility] Other MD appointment. **Notify [facility] of regression in wound condition at [phone] Bathing/ Shower/ Hygiene: May shower with wound dressing protected with water repellent cover or cast protector. No tub bath. Edema Control -  Lymphedema / Segmental Compressive Device / Other: Optional: One layer of unna paste to top of compression wrap (to act as an anchor). Patient to wear own Velcro compression garment. Remove compression stockings every night before going to bed and put on every morning when getting up. - oRDERING JUXTELITE TODAY Elevate legs to the level of the heart and pump ankles as often as possible Elevate leg(s) parallel to the floor when sitting. DO YOUR BEST to sleep in the bed at night. DO NOT sleep in your recliner. Long hours of sitting in a recliner leads to swelling of the legs and/or potential wounds on your backside. Off-Loading: Open toe surgical shoe with peg assist. Additional Orders / Instructions: Follow Nutritious Diet and Increase Protein Intake Radiology ordered were: X-ray, foot - take your xray order and [facility] of your left foot today WOUND #1: -  Foot Wound Laterality: Plantar, Left Cleanser: Soap and Water 2 x Per Week/30 Days Discharge Instructions: Gently cleanse wound with antibacterial soap, rinse and pat dry prior to dressing wounds Primary Dressing: Silvercel 4 1/4x 4 1/4 (in/in) 2 x Per Week/30 Days Discharge Instructions: Apply Silvercel 4 1/4x 4 1/4 (in/in) as instructed Secondary Dressing: Zetuvit Absorbent Pad, 4x4 (in/in) 2 x Per Week/30 Days Discharge Instructions: ABSORBANT DRESSING OR ALTERNATE SUB Compression Wrap: Profore Lite LF 3 Multilayer Compression Bandaging System 2 x Per Week/30 Days Discharge Instructions: Apply 3 multi-layer wrap as prescribed. WOUND #2: - Lower Leg Wound Laterality: Left, Midline, Distal Cleanser: Soap and Water 2 x Per Week/30 Days Discharge Instructions: Gently cleanse wound with antibacterial soap, rinse and pat dry prior to dressing wounds Cleanser: Wound Cleanser 2 x Per Week/30 Days Discharge Instructions: Wash your hands with soap and water. Remove old dressing, discard into plastic bag and place into trash. Cleanse the wound with Wound Cleanser prior to applying a clean dressing using gauze sponges, not tissues or cotton balls. Do not scrub or use excessive force. Pat dry using gauze sponges, not tissue or cotton balls. Primary Dressing: Silvercel 4 1/4x 4 1/4 (in/in) 2 x Per Week/30 Days Discharge Instructions: Apply Silvercel 4 1/4x 4 1/4 (in/in) as instructed [name], [name] ([id number]) Secondary Dressing: Zetuvit Absorbent Pad, 4x4 (in/in) 2 x Per Week/30 Days Discharge Instructions: ABSORBANT DRESSING OR ALTERNATE SUB Compression Wrap: Profore Lite LF 3 Multilayer Compression Bandaging System 2 x Per Week/30 Days Discharge Instructions: Apply 3 multi-layer wrap as prescribed. WOUND #3: - Lower Leg Wound Laterality: Left, Medial, Distal Cleanser: Soap and Water 2 x Per Week/30 Days Discharge Instructions: Gently cleanse wound with antibacterial soap, rinse and pat dry  prior to dressing wounds Cleanser: Wound Cleanser 2 x Per Week/30 Days Discharge Instructions: Wash your hands with soap and water. Remove old dressing, discard into plastic bag and place into trash. Cleanse the wound with Wound Cleanser prior to applying a clean dressing using gauze sponges, not tissues or cotton balls. Do not scrub or use excessive force. Pat dry using gauze sponges, not tissue or cotton balls. Primary Dressing: Silvercel 4 1/4x 4 1/4 (in/in) 2 x Per Week/30 Days Discharge Instructions: Apply Silvercel 4 1/4x 4 1/4 (in/in) as instructed Secondary Dressing: Zetuvit Absorbent Pad, 4x4 (in/in) 2 x Per Week/30 Days Discharge Instructions: ABSORBANT DRESSING OR ALTERNATE SUB Compression Wrap: Profore Lite LF 3 Multilayer Compression Bandaging System 2 x Per Week/30 Days Discharge Instructions: Apply 3 multi-layer wrap as prescribed. Compression Stockings: Circaid Juxta Lite Compression Wrap Compression Amount: 30-40 mmHg (left)  Discharge Instructions: Apply Circaid Juxta Lite Compression Wrap as directed WOUND #4: - Lower Leg Wound Laterality: Right, Circumferential Cleanser: Soap and Water 2 x Per Week/30 Days Discharge Instructions: Gently cleanse wound with antibacterial soap, rinse and pat dry prior to dressing wounds Cleanser: Wound Cleanser 2 x Per Week/30 Days Discharge Instructions: Wash your hands with soap and water. Remove old dressing, discard into plastic bag and place into trash. Cleanse the wound with Wound Cleanser prior to applying a clean dressing using gauze sponges, not tissues or cotton balls. Do not scrub or use excessive force. Pat dry using gauze sponges, not tissue or cotton balls. Primary Dressing: Silvercel 4 1/4x 4 1/4 (in/in) 2 x Per Week/30 Days Discharge Instructions: Apply Silvercel 4 1/4x 4 1/4 (in/in) as instructed Secondary Dressing: Zetuvit Absorbent Pad, 4x4 (in/in) 2 x Per Week/30 Days Discharge Instructions: ABSORBANT DRESSING OR ALTERNATE  SUB Compression Wrap: Profore Lite LF 3 Multilayer Compression Bandaging System 2 x Per Week/30 Days Discharge Instructions: Apply 3 multi-layer wrap as prescribed. Compression Stockings: Circaid Juxta Lite Compression Wrap (DME) Compression Amount: 30-40 mmHg (right) Discharge Instructions: Apply Circaid Juxta Lite Compression Wrap as directed WOUND #5: - Toe Fifth Wound Laterality: Right, Anterior Cleanser: Soap and Water 2 x Per Week/30 Days Discharge Instructions: Gently cleanse wound with antibacterial soap, rinse and pat dry prior to dressing wounds Cleanser: Wound Cleanser 2 x Per Week/30 Days Discharge Instructions: Wash your hands with soap and water. Remove old dressing, discard into plastic bag and place into trash. Cleanse the wound with Wound Cleanser prior to applying a clean dressing using gauze sponges, not tissues or cotton balls. Do not scrub or use excessive force. Pat dry using gauze sponges, not tissue or cotton balls. Primary Dressing: Silvercel 4 1/4x 4 1/4 (in/in) 2 x Per Week/30 Days Discharge Instructions: Apply Silvercel 4 1/4x 4 1/4 (in/in) as instructed Secondary Dressing: Zetuvit Absorbent Pad, 4x4 (in/in) 2 x Per Week/30 Days Discharge Instructions: ABSORBANT DRESSING OR ALTERNATE SUB Compression Wrap: Profore Lite LF 3 Multilayer Compression Bandaging System 2 x Per Week/30 Days Discharge Instructions: Apply 3 multi-layer wrap as prescribed. WOUND #6: - Knee Wound Laterality: Right, Anterior Cleanser: Soap and Water 2 x Per Week/30 Days Discharge Instructions: Gently cleanse wound with antibacterial soap, rinse and pat dry prior to dressing wounds Cleanser: Wound Cleanser 2 x Per Week/30 Days Discharge Instructions: Wash your hands with soap and water. Remove old dressing, discard into plastic bag and place into trash. Cleanse the wound with Wound Cleanser prior to applying a clean dressing using gauze sponges, not tissues or cotton balls. Do not scrub or  use excessive force. Pat dry using gauze sponges, not tissue or cotton balls. Primary Dressing: Silvercel 4 1/4x 4 1/4 (in/in) 2 x Per Week/30 Days Discharge Instructions: Apply Silvercel 4 1/4x 4 1/4 (in/in) as instructed Secondary Dressing: Zetuvit Absorbent Pad, 4x4 (in/in) 2 x Per Week/30 Days Discharge Instructions: ABSORBANT DRESSING OR ALTERNATE SUB Compression Wrap: Profore Lite LF 3 Multilayer Compression Bandaging System 2 x Per Week/30 Days Discharge Instructions: Apply 3 multi-layer wrap as prescribed. 1. Left foot x-ray 2. Silver alginate 3. Kerlix/Coban bilaterally 4. Follow-up nurse visit on 9/2 5. Follow-up in 1 week with me Electronic Signature(s) Signed: 12/29/2020 4:07:23 PM By: Kalman Shan DO Entered By: Kalman Shan on 12/29/2020 16:06:25 Bromell, [name] ([id number]) -------------------------------------------------------------------------------- ROS/PFSH Details Patient Name: Alec Schooner A. Date of Service: 12/28/2020 9:45 AM Medical Record Number: [id number] Patient Account Number: [id number] Date of Birth/Sex: May 10, 1955 (64  y.o. M) Treating RN: Donnamarie Poag Primary Care Provider: Fulton Reek Other Clinician: Referring Provider: Caroline More Treating Provider/Extender: Yaakov Guthrie in Treatment: 0 Information Obtained From Patient Constitutional Symptoms (General Health) Complaints and Symptoms: Negative for: Fatigue; Fever; Chills; Marked Weight Change Eyes Complaints and Symptoms: Negative for: Dry Eyes; Vision Changes; Glasses / Contacts Ear/Nose/Mouth/Throat Complaints and Symptoms: Negative for: Difficult clearing ears; Sinusitis Hematologic/Lymphatic Complaints and Symptoms: Negative for: Bleeding / Clotting Disorders; Human Immunodeficiency Virus Respiratory Complaints and Symptoms: Positive for: Shortness of Breath Medical History: Positive for: Asthma; Chronic Obstructive Pulmonary Disease (COPD); Sleep  Apnea Cardiovascular Complaints and Symptoms: Positive for: LE edema Medical History: Positive for: Coronary Artery Disease; Hypertension; Myocardial Infarction; Peripheral Venous Disease Gastrointestinal Complaints and Symptoms: Negative for: Frequent diarrhea; Nausea; Vomiting Medical History: Positive for: Colitis - IBS Genitourinary Complaints and Symptoms: Negative for: Kidney failure/ Dialysis; Incontinence/dribbling Immunological Complaints and Symptoms: Negative for: Hives; Itching Integumentary (Skin) [name], [name] ([id number]) Complaints and Symptoms: Positive for: Bleeding or bruising tendency; Swelling Negative for: Wounds; Breakdown Neurologic Complaints and Symptoms: Negative for: Numbness/parasthesias; Focal/Weakness Psychiatric Complaints and Symptoms: Positive for: Anxiety Review of System Notes: DX DIAGNOSIS DEPRESSION Endocrine Medical History: Positive for: Type II Diabetes Time with diabetes: 2000 Treated with: Insulin, Oral agents Blood sugar tested every day: Yes Tested : Musculoskeletal Medical History: Positive for: Gout; Osteoarthritis Oncologic Immunizations Pneumococcal Vaccine: Received Pneumococcal Vaccination: No Implantable Devices None Family and Social History Former smoker - ended on 05/01/2015; Marital Status - Married; Alcohol Use: Rarely; Drug Use: Prior History; Caffeine Use: Daily; Financial Concerns: No; Food, Clothing or Shelter Needs: No; Support System Lacking: No; Transportation Concerns: No Electronic Signature(s) Signed: 12/28/2020 3:14:25 PM By: Donnamarie Poag Signed: 12/29/2020 4:07:23 PM By: Kalman Shan DO Entered By: Donnamarie Poag on 12/28/2020 10:19:20 Stlouis, [name] ([id number]) -------------------------------------------------------------------------------- [name] Details Patient Name: [name], [name] Date of Service: 12/28/2020 Medical Record Number: [id number] Patient Account Number:  [id number] Date of Birth/Sex: 1956-03-09 (65 y.o. M) Treating RN: Donnamarie Poag Primary Care Provider: Fulton Reek Other Clinician: Referring Provider: Caroline More Treating Provider/Extender: Yaakov Guthrie in Treatment: 0 Diagnosis Coding ICD-10 Codes Code Description [id number] Unspecified open wound, left foot, initial encounter E11.621 Type 2 diabetes mellitus with foot ulcer S81.802A Unspecified open wound, left lower leg, initial encounter S81.801A Unspecified open wound, right lower leg, initial encounter I87.2 Venous insufficiency (chronic) (peripheral) I73.9 Peripheral vascular disease, unspecified Facility Procedures CPT4: Description Modifier Quantity Code [id number] - WOUND CARE VISIT-LEV 3 EST PT 1 CPT4: [id number] BILATERAL: Application of multi-layer venous compression system; leg (below knee), including 1 ankle and foot. Physician Procedures CPT4 Code: [id number] Description: [id number] - WC PHYS LEVEL 4 - NEW PT Modifier: Quantity: 1 CPT4 Code: Description: ICD-10 Diagnosis Description [id number] Unspecified open wound, left foot, initial encounter E11.621 Type 2 diabetes mellitus with foot ulcer S81.802A Unspecified open wound, left lower leg, initial encounter S81.801A Unspecified open wound,  right lower leg, initial encounter Modifier: Quantity: Electronic Signature(s) Signed: 12/29/2020 11:20:11 AM By: Gretta Cool, BSN, RN, CWS, Kim RN, BSN Signed: 12/29/2020 4:07:23 PM By: Kalman Shan DO Previous Signature: 12/29/2020 10:46:45 AM Version By: Kalman Shan DO Previous Signature: 12/29/2020 8:10:23 AM Version By: Donnamarie Poag Previous Signature: 12/28/2020 3:14:25 PM Version By: Donnamarie Poag Entered By: Gretta Cool, BSN, RN, CWS, Kim on 12/29/2020 11:20:10

## 2020-12-29 NOTE — Progress Notes (Addendum)
PENG, HINESLEY ([id number]) Visit Report for 12/28/2020 Allergy List Details Patient Name: [name], [name] Date of Service: 12/28/2020 9:45 AM Medical Record Number: [id number] Patient Account Number: [id number] Date of Birth/Sex: 07-Oct-1955 (65 y.o. M) Treating RN: Donnamarie Poag Primary Care Amy Gothard: Fulton Reek Other Clinician: Referring Indira Sorenson: Fulton Reek Treating Dia Donate/Extender: Yaakov Guthrie in Treatment: 0 Allergies Active Allergies No Known Allergies Allergy Notes Electronic Signature(s) Signed: 12/28/2020 3:14:25 PM By: Donnamarie Poag Entered By: Donnamarie Poag on 12/28/2020 09:59:01 [name] ([id number]) -------------------------------------------------------------------------------- Arrival Information Details Patient Name: Alec Schooner A. Date of Service: 12/28/2020 9:45 AM Medical Record Number: [id number] Patient Account Number: [id number] Date of Birth/Sex: 1955-10-02 (65 y.o. M) Treating RN: Donnamarie Poag Primary Care Jahnai Slingerland: Fulton Reek Other Clinician: Referring Gradie Butrick: Fulton Reek Treating Patric Vanpelt/Extender: Yaakov Guthrie in Treatment: 0 Visit Information Patient Arrived: Ambulatory Arrival Time: 09:53 Accompanied By: wife Transfer Assistance: None Patient Identification Verified: Yes Secondary Verification Process Completed: Yes Patient Has Alerts: Yes Patient Alerts: Patient on Blood Thinner PLAVIX/ELIQUIS Diabetic AVVS ABI 06/28/20 LEFT 0.84; RIGHT 0.99 Electronic Signature(s) Signed: 12/28/2020 3:14:25 PM By: Donnamarie Poag Entered By: Donnamarie Poag on 12/28/2020 10:23:06 [name], [name] ([id number]) -------------------------------------------------------------------------------- Clinic Level of Care Assessment Details Patient Name: [name], [name] Date of Service: 12/28/2020 9:45 AM Medical Record Number: [id number] Patient Account Number: [id number] Date of Birth/Sex: [birth year] (65 y.o.  M) Treating RN: Donnamarie Poag Primary Care Lavonne Cass: Fulton Reek Other Clinician: Referring Martavion Couper: Fulton Reek Treating Sedona Wenk/Extender: Yaakov Guthrie in Treatment: 0 Clinic Level of Care Assessment Items TOOL 1 Quantity Score '[]'$  - Use when EandM and Procedure is performed on INITIAL visit 0 ASSESSMENTS - Nursing Assessment / Reassessment X - General Physical Exam (combine w/ comprehensive assessment (listed just below) when performed on new 1 20 pt. evals) X- 1 25 Comprehensive Assessment (HX, ROS, Risk Assessments, Wounds Hx, etc.) ASSESSMENTS - Wound and Skin Assessment / Reassessment X - Dermatologic / Skin Assessment (not related to wound area) 1 10 ASSESSMENTS - Ostomy and/or Continence Assessment and Care '[]'$  - Incontinence Assessment and Management 0 '[]'$  - 0 Ostomy Care Assessment and Management (repouching, etc.) PROCESS - Coordination of Care '[]'$  - Simple Patient / Family Education for ongoing care 0 X- 1 20 Complex (extensive) Patient / Family Education for ongoing care X- 1 10 Staff obtains Consents, Records, Test Results / Process Orders X- 1 10 Staff telephones HHA, Nursing Homes / Clarify orders / etc '[]'$  - 0 Routine Transfer to another Facility (non-emergent condition) '[]'$  - 0 Routine Hospital Admission (non-emergent condition) X- 1 15 New Admissions / Biomedical engineer / Ordering NPWT, Apligraf, etc. '[]'$  - 0 Emergency Hospital Admission (emergent condition) PROCESS - Special Needs '[]'$  - Pediatric / Minor Patient Management 0 '[]'$  - 0 Isolation Patient Management '[]'$  - 0 Hearing / Language / Visual special needs '[]'$  - 0 Assessment of Community assistance (transportation, D/C planning, etc.) '[]'$  - 0 Additional assistance / Altered mentation '[]'$  - 0 Support Surface(s) Assessment (bed, cushion, seat, etc.) INTERVENTIONS - Miscellaneous '[]'$  - External ear exam 0 '[]'$  - 0 Patient Transfer (multiple staff / Civil Service fast streamer / Similar devices) '[]'$  -  0 Simple Staple / Suture removal (25 or less) '[]'$  - 0 Complex Staple / Suture removal (26 or more) '[]'$  - 0 Hypo/Hyperglycemic Management (do not check if billed separately) '[]'$  - 0 Ankle / Brachial Index (ABI) - do not check if billed separately Has the patient been seen at the hospital within the last three years:  Yes Total Score: 110 Level Of Care: New/Established - Level 3 [name], [name] ([id number]) Electronic Signature(s) Signed: 12/29/2020 4:51:56 PM By: Gretta Cool, BSN, RN, CWS, Kim RN, BSN Previous Signature: 12/29/2020 8:10:35 AM Version By: Donnamarie Poag Previous Signature: 12/28/2020 3:14:25 PM Version By: Donnamarie Poag Entered By: Gretta Cool BSN, RN, CWS, Kim on 12/29/2020 11:19:58 [name] ([id number]) -------------------------------------------------------------------------------- Compression Therapy Details Patient Name: [name], [name] Date of Service: 12/28/2020 9:45 AM Medical Record Number: [id number] Patient Account Number: [id number] Date of Birth/Sex: August 03, 1955 (65 y.o. M) Treating RN: Donnamarie Poag Primary Care Janiel Derhammer: Fulton Reek Other Clinician: Referring Chantrice Hagg: Fulton Reek Treating Margaruite Top/Extender: Yaakov Guthrie in Treatment: 0 Compression Therapy Performed for Wound Assessment: Wound #2 Left,Distal,Midline Lower Leg Performed By: Clinician Donnamarie Poag, RN Compression Type: Three Layer Post Procedure Diagnosis Same as Pre-procedure Electronic Signature(s) Signed: 12/28/2020 3:14:25 PM By: Donnamarie Poag Entered By: Donnamarie Poag on 12/28/2020 11:23:43 [name], [name] ([id number]) -------------------------------------------------------------------------------- Compression Therapy Details Patient Name: [name], [name] Date of Service: 12/28/2020 9:45 AM Medical Record Number: [id number] Patient Account Number: [id number] Date of Birth/Sex: [birth year] (65 y.o. M) Treating RN: Donnamarie Poag Primary Care Juda Toepfer: Fulton Reek Other  Clinician: Referring Akacia Boltz: Fulton Reek Treating Shaquel Josephson/Extender: Yaakov Guthrie in Treatment: 0 Compression Therapy Performed for Wound Assessment: Wound #4 Right,Circumferential Lower Leg Performed By: Clinician Donnamarie Poag, RN Compression Type: Three Layer Post Procedure Diagnosis Same as Pre-procedure Electronic Signature(s) Signed: 12/28/2020 3:14:25 PM By: Donnamarie Poag Entered By: Donnamarie Poag on 12/28/2020 11:23:43 [name], [name] ([id number]) -------------------------------------------------------------------------------- Encounter Discharge Information Details Patient Name: Alec Schooner A. Date of Service: 12/28/2020 9:45 AM Medical Record Number: [id number] Patient Account Number: [id number] Date of Birth/Sex: 1955/06/06 (65 y.o. M) Treating RN: Donnamarie Poag Primary Care Candice Lunney: Fulton Reek Other Clinician: Referring Taelyn Broecker: Fulton Reek Treating Sulo Janczak/Extender: Yaakov Guthrie in Treatment: 0 Encounter Discharge Information Items Discharge Condition: Stable Ambulatory Status: Wheelchair Discharge Destination: Home Transportation: Private Auto Accompanied By: wife Schedule Follow-up Appointment: Yes Clinical Summary of Care: Electronic Signature(s) Signed: 12/28/2020 3:14:25 PM By: Donnamarie Poag Entered By: Donnamarie Poag on 12/28/2020 12:09:35 Goulart, [name] ([id number]) -------------------------------------------------------------------------------- Lower Extremity Assessment Details Patient Name: [name], [name] Date of Service: 12/28/2020 9:45 AM Medical Record Number: [id number] Patient Account Number: [id number] Date of Birth/Sex: Nov 13, 1955 (65 y.o. M) Treating RN: Donnamarie Poag Primary Care Julio Storr: Fulton Reek Other Clinician: Referring Jasani Lengel: Fulton Reek Treating Nabor Thomann/Extender: Yaakov Guthrie in Treatment: 0 Edema Assessment Assessed: Shirlyn Goltz: Yes] [Right: Yes] Edema: [Left: Yes] [Right:  Yes] Calf Left: Right: Point of Measurement: 37 cm From Medial Instep 57.5 cm 52 cm Ankle Left: Right: Point of Measurement: 11 cm From Medial Instep 30 cm 30 cm Knee To Floor Left: Right: From Medial Instep 44 cm 44 cm Vascular Assessment Pulses: Dorsalis Pedis Palpable: [Left:No Yes] [Right:No Yes] Electronic Signature(s) Signed: 12/28/2020 3:14:25 PM By: Donnamarie Poag Entered By: Donnamarie Poag on 12/28/2020 10:22:31 [name], [name] ([id number]) -------------------------------------------------------------------------------- Multi Wound Chart Details Patient Name: [name], [name] Date of Service: 12/28/2020 9:45 AM Medical Record Number: [id number] Patient Account Number: [id number] Date of Birth/Sex: Sep 17, 1955 (65 y.o. M) Treating RN: Donnamarie Poag Primary Care Clover Feehan: Fulton Reek Other Clinician: Referring Brette Cast: Fulton Reek Treating Alicianna Litchford/Extender: Yaakov Guthrie in Treatment: 0 Vital Signs Height(in): 74.5 Pulse(bpm): 68 Weight(lbs): [id number] Blood Pressure(mmHg): 168/81 Body Mass Index(BMI): 46 Temperature(F): 98.4 Respiratory Rate(breaths/min): 18 Photos: Wound Location: Left, Plantar Foot Left, Distal, Midline Lower Leg Left, Distal, Medial Lower Leg Wounding Event: Pressure Injury Trauma Shear/Friction Primary Etiology:  Diabetic Wound/Ulcer of the Lower Lymphedema Lymphedema Extremity Secondary Etiology: [name] [name] [name] Comorbid History: Asthma, Chronic Obstructive Asthma, Chronic Obstructive Asthma, Chronic Obstructive Pulmonary Disease (COPD), Sleep Pulmonary Disease (COPD), Sleep Pulmonary Disease (COPD), Sleep Apnea, Coronary Artery Disease, Apnea, Coronary Artery Disease, Apnea, Coronary Artery Disease, Hypertension, Myocardial Infarction, Hypertension, Myocardial Infarction, Hypertension, Myocardial Infarction, Peripheral Venous Disease, Colitis, Peripheral Venous Disease, Colitis, Peripheral Venous Disease, Colitis, Type II Diabetes,  Gout, Type II Diabetes, Gout, Type II Diabetes, Gout, Osteoarthritis Osteoarthritis Osteoarthritis Date Acquired: 05/14/2020 11/30/2020 12/18/2020 Weeks of Treatment: 0 0 0 Wound Status: Open Open Open Clustered Wound: No Yes Yes Measurements L x W x D (cm) 0.8x0.9x0.5 8x7x0.1 3.5x4x0.1 Area (cm) : 0.565 43.982 10.996 Volume (cm) : 0.283 4.398 1.1 Classification: Grade 2 Full Thickness Without Exposed Full Thickness Without Exposed Support Structures Support Structures Exudate Amount: Medium Medium Medium Exudate Type: Serosanguineous Serosanguineous Serosanguineous Exudate Color: red, brown red, brown red, brown Granulation Amount: Medium (34-66%) Large (67-100%) Large (67-100%) Granulation Quality: Pink Red [name] Necrotic Amount: Medium (34-66%) Small (1-33%) Small (1-33%) Necrotic Tissue: Adherent [place] Exposed Structures: Fat Layer (Subcutaneous Tissue): Fat Layer (Subcutaneous Tissue): Fat Layer (Subcutaneous Tissue): Yes Yes Yes Fascia: No Fascia: No Fascia: No Tendon: No Tendon: No Tendon: No Muscle: No Muscle: No Muscle: No Joint: No Joint: No Joint: No Bone: No Bone: No Bone: No Procedures Performed: [name] Compression Therapy [name] Wound Number: '4 5 6 '$ Photos: [name], Alec AMarland Kitchen ([id number]) Wound Location: Right, Circumferential Lower Leg Right, Anterior Toe Fifth Right, Anterior Knee Wounding Event: Blister Shear/Friction Skin Tear/Laceration Primary Etiology: Lymphedema Diabetic Wound/Ulcer of the Lower Trauma, Other Extremity Secondary Etiology: Venous Leg Ulcer [name] [name] Comorbid History: Asthma, Chronic Obstructive Asthma, Chronic Obstructive Asthma, Chronic Obstructive Pulmonary Disease (COPD), Sleep Pulmonary Disease (COPD), Sleep Pulmonary Disease (COPD), Sleep Apnea, Coronary Artery Disease, Apnea, Coronary Artery Disease, Apnea, Coronary Artery Disease, Hypertension, Myocardial Infarction, Hypertension, Myocardial Infarction,  Hypertension, Myocardial Infarction, Peripheral Venous Disease, Colitis, Peripheral Venous Disease, Colitis, Peripheral Venous Disease, Colitis, Type II Diabetes, Gout, Type II Diabetes, Gout, Type II Diabetes, Gout, Osteoarthritis Osteoarthritis Osteoarthritis Date Acquired: 11/11/2020 12/23/2020 11/30/2020 Weeks of Treatment: 0 0 0 Wound Status: Open Open Open Clustered Wound: Yes No No Measurements L x W x D (cm) 16x9x0.1 0.7x0.4x0.1 1.3x1.9x0.1 Area (cm) : 113.097 0.22 1.94 Volume (cm) : 11.31 0.022 0.194 Classification: Full Thickness Without Exposed Grade 2 Full Thickness Without Exposed Support Structures Support Structures Exudate Amount: Large Medium Medium Exudate Type: Serous Serosanguineous Serosanguineous Exudate Color: amber red, brown red, brown Granulation Amount: Medium (34-66%) None Present (0%) None Present (0%) Granulation Quality: Red, Pink [name] [name] Necrotic Amount: Medium (34-66%) Large (67-100%) Large (67-100%) Necrotic Tissue: Eschar, Adherent Slough Eschar Eschar Exposed Structures: Fat Layer (Subcutaneous Tissue): Fat Layer (Subcutaneous Tissue): Fat Layer (Subcutaneous Tissue): Yes Yes Yes Fascia: No Fascia: No Fascia: No Tendon: No Tendon: No Tendon: No Muscle: No Muscle: No Muscle: No Joint: No Joint: No Joint: No Bone: No Bone: No Bone: No Procedures Performed: Compression Therapy [name] [name] Treatment Notes Wound #1 (Foot) Wound Laterality: Plantar, Left Cleanser Soap and Water Discharge Instruction: Gently cleanse wound with antibacterial soap, rinse and pat dry prior to dressing wounds Peri-Wound Care Topical Primary Dressing Silvercel 4 1/4x 4 1/4 (in/in) Discharge Instruction: Apply Silvercel 4 1/4x 4 1/4 (in/in) as instructed Secondary Dressing Zetuvit Absorbent Pad, 4x4 (in/in) Discharge Instruction: ABSORBANT DRESSING OR ALTERNATE SUB Secured With Compression Wrap Profore Lite LF 3 Multilayer Compression [place] Discharge  Instruction: Apply 3 multi-layer wrap as prescribed. Compression Stockings Add-Ons Nusser, Kristine A. ([id number]) Wound #2 (Lower Leg) Wound Laterality: Left, Midline, Distal Cleanser Soap and Water Discharge Instruction: Gently cleanse wound with antibacterial soap, rinse and pat dry prior to dressing wounds Wound Cleanser Discharge Instruction: Wash your hands with soap and water. Remove old dressing, discard into plastic bag and place into trash. Cleanse the wound with Wound Cleanser prior to applying a clean dressing using gauze sponges, not tissues or cotton balls. Do not scrub or use excessive force. Pat dry using gauze sponges, not tissue or cotton balls. Peri-Wound Care Topical Primary Dressing Silvercel 4 1/4x 4 1/4 (in/in) Discharge Instruction: Apply Silvercel 4 1/4x 4 1/4 (in/in) as instructed Secondary Dressing Zetuvit Absorbent Pad, 4x4 (in/in) Discharge Instruction: ABSORBANT DRESSING OR ALTERNATE SUB Secured With Compression Wrap Profore Lite LF 3 Multilayer Compression Bandaging System Discharge Instruction: Apply 3 multi-layer wrap as prescribed. Compression Stockings Add-Ons Wound #3 (Lower Leg) Wound Laterality: Left, Medial, Distal Cleanser Soap and Water Discharge Instruction: Gently cleanse wound with antibacterial soap, rinse and pat dry prior to dressing wounds Wound Cleanser Discharge Instruction: Wash your hands with soap and water. Remove old dressing, discard into plastic bag and place into trash. Cleanse the wound with Wound Cleanser prior to applying a clean dressing using gauze sponges, not tissues or cotton balls. Do not scrub or use excessive force. Pat dry using gauze sponges, not tissue or cotton balls. Peri-Wound Care Topical Primary Dressing Silvercel 4 1/4x 4 1/4 (in/in) Discharge Instruction: Apply Silvercel 4 1/4x 4 1/4 (in/in) as instructed Secondary Dressing Zetuvit Absorbent Pad, 4x4 (in/in) Discharge Instruction: ABSORBANT  DRESSING OR ALTERNATE SUB Secured With Compression Wrap Profore Lite LF 3 Multilayer Compression Bandaging System Discharge Instruction: Apply 3 multi-layer wrap as prescribed. Compression Stockings Circaid Juxta Lite Compression Wrap Quantity: 1 Left Leg Compression Amount: 30-40 mmHg Discharge Instruction: Apply Circaid Juxta Lite Compression Wrap as directed Kempner, Theodus A. ([id number]) Add-Ons Wound #4 (Lower Leg) Wound Laterality: Right, Circumferential Cleanser Soap and Water Discharge Instruction: Gently cleanse wound with antibacterial soap, rinse and pat dry prior to dressing wounds Wound Cleanser Discharge Instruction: Wash your hands with soap and water. Remove old dressing, discard into plastic bag and place into trash. Cleanse the wound with Wound Cleanser prior to applying a clean dressing using gauze sponges, not tissues or cotton balls. Do not scrub or use excessive force. Pat dry using gauze sponges, not tissue or cotton balls. Peri-Wound Care Topical Primary Dressing Silvercel 4 1/4x 4 1/4 (in/in) Discharge Instruction: Apply Silvercel 4 1/4x 4 1/4 (in/in) as instructed Secondary Dressing Zetuvit Absorbent Pad, 4x4 (in/in) Discharge Instruction: ABSORBANT DRESSING OR ALTERNATE SUB Secured With Compression Wrap Profore Lite LF 3 Multilayer Compression Bandaging System Discharge Instruction: Apply 3 multi-layer wrap as prescribed. Compression Stockings Circaid Juxta Lite Compression Wrap Quantity: 1 Right Leg Compression Amount: 30-40 mmHg Discharge Instruction: Apply Circaid Juxta Lite Compression Wrap as directed Add-Ons Wound #5 (Toe Fifth) Wound Laterality: Right, Anterior Cleanser Soap and Water Discharge Instruction: Gently cleanse wound with antibacterial soap, rinse and pat dry prior to dressing wounds Wound Cleanser Discharge Instruction: Wash your hands with soap and water. Remove old dressing, discard into plastic bag and place into  trash. Cleanse the wound with Wound Cleanser prior to applying a clean dressing using gauze sponges, not tissues or cotton balls. Do not scrub or use excessive force. Pat dry using gauze sponges, not tissue or cotton balls. Peri-Wound Care Topical Primary Dressing Silvercel 4  1/4x 4 1/4 (in/in) Discharge Instruction: Apply Silvercel 4 1/4x 4 1/4 (in/in) as instructed Secondary Dressing Zetuvit Absorbent Pad, 4x4 (in/in) Discharge Instruction: ABSORBANT DRESSING OR ALTERNATE SUB Secured With Compression Wrap Profore Lite LF 3 Multilayer Compression Bandaging System Discharge Instruction: Apply 3 multi-layer wrap as prescribed. Speltz, VRAJ TEASE ([id number]) Compression Stockings Add-Ons Wound #6 (Knee) Wound Laterality: Right, Anterior Cleanser Soap and Water Discharge Instruction: Gently cleanse wound with antibacterial soap, rinse and pat dry prior to dressing wounds Wound Cleanser Discharge Instruction: Wash your hands with soap and water. Remove old dressing, discard into plastic bag and place into trash. Cleanse the wound with Wound Cleanser prior to applying a clean dressing using gauze sponges, not tissues or cotton balls. Do not scrub or use excessive force. Pat dry using gauze sponges, not tissue or cotton balls. Peri-Wound Care Topical Primary Dressing Silvercel 4 1/4x 4 1/4 (in/in) Discharge Instruction: Apply Silvercel 4 1/4x 4 1/4 (in/in) as instructed Secondary Dressing Zetuvit Absorbent Pad, 4x4 (in/in) Discharge Instruction: ABSORBANT DRESSING OR ALTERNATE SUB Secured With Compression Wrap Profore Lite LF 3 Multilayer Compression Bandaging System Discharge Instruction: Apply 3 multi-layer wrap as prescribed. Compression Stockings Add-Ons Electronic Signature(s) Signed: 12/29/2020 4:07:23 PM By: Kalman Shan DO Entered By: Kalman Shan on 12/29/2020 12:55:51 Funes, [name]  ([id number]) -------------------------------------------------------------------------------- Multi-Disciplinary Care Plan Details Patient Name: Tritz, Lawernce A. Date of Service: 12/28/2020 9:45 AM Medical Record Number: [id number] Patient Account Number: [id number] Date of Birth/Sex: [birth year] (65 y.o. M) Treating RN: Donnamarie Poag Primary Care Sheva Mcdougle: Fulton Reek Other Clinician: Referring Toluwani Yadav: Fulton Reek Treating Avigdor Dollar/Extender: Yaakov Guthrie in Treatment: 0 Active Inactive Electronic Signature(s) Signed: 02/01/2021 1:31:26 PM By: Gretta Cool, BSN, RN, CWS, Kim RN, BSN Signed: 02/23/2021 3:14:40 PM By: Donnamarie Poag Previous Signature: 12/28/2020 3:14:25 PM Version By: Donnamarie Poag Entered By: Gretta Cool BSN, RN, CWS, Kim on 02/01/2021 13:31:26 Adorno, [name] ([id number]) -------------------------------------------------------------------------------- Pain Assessment Details Patient Name: Kos, Hrishikesh A. Date of Service: 12/28/2020 9:45 AM Medical Record Number: [id number] Patient Account Number: [id number] Date of Birth/Sex: 18-Oct-1955 (65 y.o. M) Treating RN: Donnamarie Poag Primary Care Denine Brotz: Fulton Reek Other Clinician: Referring Shakeyla Giebler: Fulton Reek Treating Issabelle Mcraney/Extender: Yaakov Guthrie in Treatment: 0 Active Problems Location of Pain Severity and Description of Pain Patient Has Paino Yes Site Locations Rate the pain. Current Pain Level: 2 Pain Management and Medication Current Pain Management: Electronic Signature(s) Signed: 12/28/2020 3:14:25 PM By: Donnamarie Poag Entered By: Donnamarie Poag on 12/28/2020 09:57:24 Gram, [name] ([id number]) -------------------------------------------------------------------------------- Patient/Caregiver Education Details Patient Name: Alec Schooner A. Date of Service: 12/28/2020 9:45 AM Medical Record Number: [id number] Patient Account Number: [id number] Date of Birth/Gender: [birth year]  (65 y.o. M) Treating RN: Donnamarie Poag Primary Care Physician: Fulton Reek Other Clinician: Referring Physician: Fulton Reek Treating Physician/Extender: Yaakov Guthrie in Treatment: 0 Education Assessment Education Provided To: Patient and Caregiver Education Topics Provided Basic Hygiene: Nutrition: Offloading: Venous: [facility]: Wound Debridement: Wound/Skin Impairment: Electronic Signature(s) Signed: 12/28/2020 3:14:25 PM By: Donnamarie Poag Entered By: Donnamarie Poag on 12/28/2020 11:17:01 Tapper, [name] ([id number]) -------------------------------------------------------------------------------- Wound Assessment Details Patient Name: Koogler, Kamauri A. Date of Service: 12/28/2020 9:45 AM Medical Record Number: [id number] Patient Account Number: [id number] Date of Birth/Sex: [birth year] (65 y.o. M) Treating RN: Donnamarie Poag Primary Care Jamielee Mchale: Fulton Reek Other Clinician: Referring Mesa Janus: Fulton Reek Treating Yocheved Depner/Extender: Yaakov Guthrie in Treatment: 0 Wound Status Wound Number: 1 Primary Diabetic Wound/Ulcer of the Lower Extremity Etiology: Wound Location: Left, Plantar Foot Wound Open Wounding  Event: Pressure Injury Status: Date Acquired: 05/14/2020 Comorbid Asthma, Chronic Obstructive Pulmonary Disease (COPD), Weeks Of Treatment: 0 History: Sleep Apnea, Coronary Artery Disease, Hypertension, Clustered Wound: No Myocardial Infarction, Peripheral Venous Disease, Colitis, Type II Diabetes, Gout, Osteoarthritis Photos Wound Measurements Length: (cm) 0.8 Width: (cm) 0.9 Depth: (cm) 0.5 Area: (cm) 0.565 Volume: (cm) 0.283 % Reduction in Area: % Reduction in Volume: Tunneling: No Undermining: No Wound Description Classification: Grade 2 Exudate Amount: Medium Exudate Type: Serosanguineous Exudate Color: red, brown Foul Odor After Cleansing: No Slough/Fibrino Yes Wound Bed Granulation Amount:  Medium (34-66%) Exposed Structure Granulation Quality: Pink Fascia Exposed: No Necrotic Amount: Medium (34-66%) Fat Layer (Subcutaneous Tissue) Exposed: Yes Necrotic Quality: Adherent Slough Tendon Exposed: No Muscle Exposed: No Joint Exposed: No Bone Exposed: No Electronic Signature(s) Signed: 12/28/2020 3:14:25 PM By: Donnamarie Poag Entered By: Donnamarie Poag on 12/28/2020 10:25:52 Englander, [name] ([id number]) -------------------------------------------------------------------------------- Wound Assessment Details Patient Name: Wilsey, Kingsly A. Date of Service: 12/28/2020 9:45 AM Medical Record Number: [id number] Patient Account Number: [id number] Date of Birth/Sex: 1955-08-11 (65 y.o. M) Treating RN: Donnamarie Poag Primary Care Vesna Kable: Fulton Reek Other Clinician: Referring Sagar Tengan: Fulton Reek Treating Larcenia Holaday/Extender: Yaakov Guthrie in Treatment: 0 Wound Status Wound Number: 2 Primary Lymphedema Etiology: Wound Location: Left, Distal, Midline Lower Leg Wound Open Wounding Event: Trauma Status: Date Acquired: 11/30/2020 Comorbid Asthma, Chronic Obstructive Pulmonary Disease (COPD), Weeks Of Treatment: 0 History: Sleep Apnea, Coronary Artery Disease, Hypertension, Clustered Wound: Yes Myocardial Infarction, Peripheral Venous Disease, Colitis, Type II Diabetes, Gout, Osteoarthritis Photos Wound Measurements Length: (cm) 8 Width: (cm) 7 Depth: (cm) 0.1 Area: (cm) 43.982 Volume: (cm) 4.398 % Reduction in Area: % Reduction in Volume: Tunneling: No Undermining: No Wound Description Classification: Full Thickness Without Exposed Support Structu Exudate Amount: Medium Exudate Type: Serosanguineous Exudate Color: red, brown res Foul Odor After Cleansing: No Slough/Fibrino Yes Wound Bed Granulation Amount: Large (67-100%) Exposed Structure Granulation Quality: Red Fascia Exposed: No Necrotic Amount: Small (1-33%) Fat Layer (Subcutaneous Tissue)  Exposed: Yes Necrotic Quality: Adherent Slough Tendon Exposed: No Muscle Exposed: No Joint Exposed: No Bone Exposed: No Electronic Signature(s) Signed: 12/28/2020 3:14:25 PM By: Donnamarie Poag Entered By: Donnamarie Poag on 12/28/2020 10:27:55 Haber, [name] ([id number]) -------------------------------------------------------------------------------- Wound Assessment Details Patient Name: Simonich, Uziel A. Date of Service: 12/28/2020 9:45 AM Medical Record Number: [id number] Patient Account Number: [id number] Date of Birth/Sex: March 05, 1956 (64 y.o. M) Treating RN: Donnamarie Poag Primary Care Aireonna Bauer: Fulton Reek Other Clinician: Referring Yuriy Cui: Fulton Reek Treating Jiles Goya/Extender: Yaakov Guthrie in Treatment: 0 Wound Status Wound Number: 3 Primary Lymphedema Etiology: Wound Location: Left, Distal, Medial Lower Leg Wound Open Wounding Event: Shear/Friction Status: Date Acquired: 12/18/2020 Comorbid Asthma, Chronic Obstructive Pulmonary Disease (COPD), Weeks Of Treatment: 0 History: Sleep Apnea, Coronary Artery Disease, Hypertension, Clustered Wound: Yes Myocardial Infarction, Peripheral Venous Disease, Colitis, Type II Diabetes, Gout, Osteoarthritis Photos Wound Measurements Length: (cm) 3.5 Width: (cm) 4 Depth: (cm) 0.1 Area: (cm) 10.996 Volume: (cm) 1.1 % Reduction in Area: % Reduction in Volume: Tunneling: No Undermining: No Wound Description Classification: Full Thickness Without Exposed Support Structu Exudate Amount: Medium Exudate Type: Serosanguineous Exudate Color: red, brown res Foul Odor After Cleansing: No Slough/Fibrino Yes Wound Bed Granulation Amount: Large (67-100%) Exposed Structure Necrotic Amount: Small (1-33%) Fascia Exposed: No Necrotic Quality: Adherent Slough Fat Layer (Subcutaneous Tissue) Exposed: Yes Tendon Exposed: No Muscle Exposed: No Joint Exposed: No Bone Exposed: No Electronic Signature(s) Signed:  12/28/2020 3:14:25 PM By: Donnamarie Poag Entered ByDonnamarie Poag on 12/28/2020 10:29:33 Cabezas,  ETAN CORIELL ([id number]) -------------------------------------------------------------------------------- Wound Assessment Details Patient Name: Hickmon, Deigo A. Date of Service: 12/28/2020 9:45 AM Medical Record Number: [id number] Patient Account Number: [id number] Date of Birth/Sex: [birth year] (65 y.o. M) Treating RN: Donnamarie Poag Primary Care Ahsley Attwood: Fulton Reek Other Clinician: Referring Sayyid Harewood: Fulton Reek Treating Kaleeyah Cuffie/Extender: Yaakov Guthrie in Treatment: 0 Wound Status Wound Number: 4 Primary Lymphedema Etiology: Wound Location: Right, Circumferential Lower Leg Secondary Venous Leg Ulcer Wounding Event: Blister Etiology: Date Acquired: 11/11/2020 Wound Open Weeks Of Treatment: 0 Status: Clustered Wound: Yes Comorbid Asthma, Chronic Obstructive Pulmonary Disease (COPD), History: Sleep Apnea, Coronary Artery Disease, Hypertension, Myocardial Infarction, Peripheral Venous Disease, Colitis, Type II Diabetes, Gout, Osteoarthritis Photos Wound Measurements Length: (cm) 16 Width: (cm) 9 Depth: (cm) 0.1 Area: (cm) 113.097 Volume: (cm) 11.31 % Reduction in Area: % Reduction in Volume: Wound Description Classification: Full Thickness Without Exposed Support Structu Exudate Amount: Large Exudate Type: Serous Exudate Color: amber res Foul Odor After Cleansing: No Slough/Fibrino Yes Wound Bed Granulation Amount: Medium (34-66%) Exposed Structure Granulation Quality: Red, Pink Fascia Exposed: No Necrotic Amount: Medium (34-66%) Fat Layer (Subcutaneous Tissue) Exposed: Yes Necrotic Quality: Eschar, Adherent Slough Tendon Exposed: No Muscle Exposed: No Joint Exposed: No Bone Exposed: No Electronic Signature(s) Signed: 12/28/2020 3:14:25 PM By: Donnamarie Poag Entered By: Donnamarie Poag on 12/28/2020 10:31:58 Merrick, [name]  ([id number]) -------------------------------------------------------------------------------- Wound Assessment Details Patient Name: Ensey, Navarro A. Date of Service: 12/28/2020 9:45 AM Medical Record Number: [id number] Patient Account Number: [id number] Date of Birth/Sex: 01-Apr-1956 (65 y.o. M) Treating RN: Donnamarie Poag Primary Care Sherriann Szuch: Fulton Reek Other Clinician: Referring Ayen Viviano: Fulton Reek Treating Sarabi Sockwell/Extender: Yaakov Guthrie in Treatment: 0 Wound Status Wound Number: 5 Primary Diabetic Wound/Ulcer of the Lower Extremity Etiology: Wound Location: Right, Anterior Toe Fifth Wound Open Wounding Event: Shear/Friction Status: Date Acquired: 12/23/2020 Comorbid Asthma, Chronic Obstructive Pulmonary Disease (COPD), Weeks Of Treatment: 0 History: Sleep Apnea, Coronary Artery Disease, Hypertension, Clustered Wound: No Myocardial Infarction, Peripheral Venous Disease, Colitis, Type II Diabetes, Gout, Osteoarthritis Photos Wound Measurements Length: (cm) 0.7 Width: (cm) 0.4 Depth: (cm) 0.1 Area: (cm) 0.22 Volume: (cm) 0.022 % Reduction in Area: % Reduction in Volume: Tunneling: No Undermining: No Wound Description Classification: Grade 2 Exudate Amount: Medium Exudate Type: Serosanguineous Exudate Color: red, brown Foul Odor After Cleansing: No Slough/Fibrino Yes Wound Bed Granulation Amount: None Present (0%) Exposed Structure Necrotic Amount: Large (67-100%) Fascia Exposed: No Necrotic Quality: Eschar Fat Layer (Subcutaneous Tissue) Exposed: Yes Tendon Exposed: No Muscle Exposed: No Joint Exposed: No Bone Exposed: No Electronic Signature(s) Signed: 12/28/2020 3:14:25 PM By: Donnamarie Poag Entered By: Donnamarie Poag on 12/28/2020 10:33:53 Lagoy, [name] ([id number]) -------------------------------------------------------------------------------- Wound Assessment Details Patient Name: Bettinger, Jahden A. Date of Service: 12/28/2020  9:45 AM Medical Record Number: [id number] Patient Account Number: [id number] Date of Birth/Sex: 11/14/1955 (65 y.o. M) Treating RN: Donnamarie Poag Primary Care Barrett Holthaus: Fulton Reek Other Clinician: Referring Norvella Loscalzo: Fulton Reek Treating Ranyah Groeneveld/Extender: Yaakov Guthrie in Treatment: 0 Wound Status Wound Number: 6 Primary Trauma, Other Etiology: Wound Location: Right, Anterior Knee Wound Open Wounding Event: Skin Tear/Laceration Status: Date Acquired: 11/30/2020 Comorbid Asthma, Chronic Obstructive Pulmonary Disease (COPD), Weeks Of Treatment: 0 History: Sleep Apnea, Coronary Artery Disease, Hypertension, Clustered Wound: No Myocardial Infarction, Peripheral Venous Disease, Colitis, Type II Diabetes, Gout, Osteoarthritis Photos Wound Measurements Length: (cm) 1.3 Width: (cm) 1.9 Depth: (cm) 0.1 Area: (cm) 1.94 Volume: (cm) 0.194 % Reduction in Area: % Reduction in Volume: Tunneling: No Undermining: No Wound Description Classification: Full Thickness Without Exposed  Support Structu Exudate Amount: Medium Exudate Type: Serosanguineous Exudate Color: red, brown res Foul Odor After Cleansing: No Slough/Fibrino Yes Wound Bed Granulation Amount: None Present (0%) Exposed Structure Necrotic Amount: Large (67-100%) Fascia Exposed: No Necrotic Quality: Eschar Fat Layer (Subcutaneous Tissue) Exposed: Yes Tendon Exposed: No Muscle Exposed: No Joint Exposed: No Bone Exposed: No Electronic Signature(s) Signed: 12/28/2020 3:14:25 PM By: Donnamarie Poag Entered By: Donnamarie Poag on 12/28/2020 10:35:27 Hanley, [name] ([id number]) -------------------------------------------------------------------------------- Vitals Details Patient Name: Beg, Jenaro A. Date of Service: 12/28/2020 9:45 AM Medical Record Number: [id number] Patient Account Number: [id number] Date of Birth/Sex: Aug 04, 1955 (65 y.o. M) Treating RN: Donnamarie Poag Primary Care Tammye Kahler: Fulton Reek Other Clinician: Referring Daishaun Ayre: Fulton Reek Treating Deltha Bernales/Extender: Yaakov Guthrie in Treatment: 0 Vital Signs Time Taken: 09:55 Temperature (F): 98.4 Height (in): 74.5 Pulse (bpm): 83 Source: Stated Respiratory Rate (breaths/min): 18 Weight (lbs): 363 Blood Pressure (mmHg): 168/81 Source: Measured Reference Range: 80 - 120 mg / dl Body Mass Index (BMI): 46 Electronic Signature(s) Signed: 12/28/2020 3:14:25 PM By: Donnamarie Poag Entered ByDonnamarie Poag on 12/28/2020 09:58:48

## 2020-12-30 ENCOUNTER — Encounter: Payer: Medicare Other | Attending: Physician Assistant

## 2020-12-30 ENCOUNTER — Other Ambulatory Visit: Payer: Self-pay

## 2020-12-30 DIAGNOSIS — G4730 Sleep apnea, unspecified: Secondary | ICD-10-CM | POA: Insufficient documentation

## 2020-12-30 DIAGNOSIS — K529 Noninfective gastroenteritis and colitis, unspecified: Secondary | ICD-10-CM | POA: Diagnosis not present

## 2020-12-30 DIAGNOSIS — E1142 Type 2 diabetes mellitus with diabetic polyneuropathy: Secondary | ICD-10-CM | POA: Diagnosis not present

## 2020-12-30 DIAGNOSIS — I2510 Atherosclerotic heart disease of native coronary artery without angina pectoris: Secondary | ICD-10-CM | POA: Diagnosis not present

## 2020-12-30 DIAGNOSIS — L97919 Non-pressure chronic ulcer of unspecified part of right lower leg with unspecified severity: Secondary | ICD-10-CM | POA: Insufficient documentation

## 2020-12-30 DIAGNOSIS — J449 Chronic obstructive pulmonary disease, unspecified: Secondary | ICD-10-CM | POA: Diagnosis not present

## 2020-12-30 DIAGNOSIS — M1990 Unspecified osteoarthritis, unspecified site: Secondary | ICD-10-CM | POA: Insufficient documentation

## 2020-12-30 DIAGNOSIS — E11622 Type 2 diabetes mellitus with other skin ulcer: Secondary | ICD-10-CM | POA: Insufficient documentation

## 2020-12-30 DIAGNOSIS — M109 Gout, unspecified: Secondary | ICD-10-CM | POA: Diagnosis not present

## 2020-12-30 DIAGNOSIS — I890 Lymphedema, not elsewhere classified: Secondary | ICD-10-CM | POA: Insufficient documentation

## 2020-12-30 DIAGNOSIS — I252 Old myocardial infarction: Secondary | ICD-10-CM | POA: Diagnosis not present

## 2020-12-30 DIAGNOSIS — I10 Essential (primary) hypertension: Secondary | ICD-10-CM | POA: Insufficient documentation

## 2020-12-30 NOTE — Progress Notes (Signed)
[name] ([id number]) Visit Report for 12/30/2020 Arrival Information Details Patient Name: [name], [name] Date of Service: 12/30/2020 9:30 AM Medical Record Number: [id number] Patient Account Number: [id number] Date of Birth/Sex: 1955-10-12 (65 y.o. M) Treating RN: Donnamarie Poag Primary Care Petrice Beedy: Fulton Reek Other Clinician: Referring Luchiano Viscomi: Fulton Reek Treating Lisia Westbay/Extender: Skipper Cliche in Treatment: 0 Visit Information History Since Last Visit Added or deleted any medications: No Patient Arrived: Ambulatory Had a fall or experienced change in No Arrival Time: 09:19 activities of daily living that may affect Accompanied By: wife risk of falls: Transfer Assistance: None Hospitalized since last visit: No Patient Identification Verified: Yes Has Dressing in Place as Prescribed: Yes Secondary Verification Process Completed: Yes Has Compression in Place as Prescribed: Yes Patient Has Alerts: Yes Pain Present Now: No Patient Alerts: Patient on Blood Thinner PLAVIX/ELIQUIS Diabetic AVVS ABI 06/28/20 LEFT 0.84; RIGHT 0.99 Electronic Signature(s) Signed: 12/30/2020 1:30:21 PM By: Donnamarie Poag Entered By: Donnamarie Poag on 12/30/2020 09:20:13 Pandolfi, Nanine Means ([id number]) -------------------------------------------------------------------------------- Clinic Level of Care Assessment Details Patient Name: [name], [name] Date of Service: 12/30/2020 9:30 AM Medical Record Number: [id number] Patient Account Number: [id number] Date of Birth/Sex: [birth year] (65 y.o. M) Treating RN: Donnamarie Poag Primary Care Mattheu Brodersen: Fulton Reek Other Clinician: Referring Fady Stamps: Fulton Reek Treating Marvella Jenning/Extender: Skipper Cliche in Treatment: 0 Clinic Level of Care Assessment Items TOOL 1 Quantity Score '[]'$  - Use when EandM and Procedure is performed on INITIAL visit 0 ASSESSMENTS - Nursing Assessment / Reassessment '[]'$  - General Physical Exam  (combine w/ comprehensive assessment (listed just below) when performed on new 0 pt. evals) '[]'$  - 0 Comprehensive Assessment (HX, ROS, Risk Assessments, Wounds Hx, etc.) ASSESSMENTS - Wound and Skin Assessment / Reassessment '[]'$  - Dermatologic / Skin Assessment (not related to wound area) 0 ASSESSMENTS - Ostomy and/or Continence Assessment and Care '[]'$  - Incontinence Assessment and Management 0 '[]'$  - 0 Ostomy Care Assessment and Management (repouching, etc.) PROCESS - Coordination of Care '[]'$  - Simple Patient / Family Education for ongoing care 0 '[]'$  - 0 Complex (extensive) Patient / Family Education for ongoing care '[]'$  - 0 Staff obtains Consents, Records, Test Results / Process Orders '[]'$  - 0 Staff telephones HHA, Nursing Homes / Clarify orders / etc '[]'$  - 0 Routine Transfer to another Facility (non-emergent condition) '[]'$  - 0 Routine Hospital Admission (non-emergent condition) '[]'$  - 0 New Admissions / Biomedical engineer / Ordering NPWT, Apligraf, etc. '[]'$  - 0 Emergency Hospital Admission (emergent condition) PROCESS - Special Needs '[]'$  - Pediatric / Minor Patient Management 0 '[]'$  - 0 Isolation Patient Management '[]'$  - 0 Hearing / Language / Visual special needs '[]'$  - 0 Assessment of Community assistance (transportation, D/C planning, etc.) '[]'$  - 0 Additional assistance / Altered mentation '[]'$  - 0 Support Surface(s) Assessment (bed, cushion, seat, etc.) INTERVENTIONS - Miscellaneous '[]'$  - External ear exam 0 '[]'$  - 0 Patient Transfer (multiple staff / Civil Service fast streamer / Similar devices) '[]'$  - 0 Simple Staple / Suture removal (25 or less) '[]'$  - 0 Complex Staple / Suture removal (26 or more) '[]'$  - 0 Hypo/Hyperglycemic Management (do not check if billed separately) '[]'$  - 0 Ankle / Brachial Index (ABI) - do not check if billed separately Has the patient been seen at the hospital within the last three years: Yes Total Score: 0 Level Of Care: ____ [name] ([id number]) Electronic  Signature(s) Signed: 12/30/2020 1:30:21 PM By: Donnamarie Poag Entered By: Donnamarie Poag on 12/30/2020 09:48:10 [name], [name] Kitchen ([id number]) --------------------------------------------------------------------------------  Compression Therapy Details Patient Name: [name], [name] Date of Service: 12/30/2020 9:30 AM Medical Record Number: [id number] Patient Account Number: [id number] Date of Birth/Sex: February 02, 1956 (65 y.o. M) Treating RN: Donnamarie Poag Primary Care Bryli Mantey: Fulton Reek Other Clinician: Referring Dhani Imel: Fulton Reek Treating Davianna Deutschman/Extender: Skipper Cliche in Treatment: 0 Compression Therapy Performed for Wound Assessment: Wound #4 Right,Circumferential Lower Leg Performed By: Clinician Donnamarie Poag, RN Compression Type: Three Layer Electronic Signature(s) Signed: 12/30/2020 1:30:21 PM By: Donnamarie Poag Entered By: Donnamarie Poag on 12/30/2020 09:47:09 Kudrna, Nanine Means ([id number]) -------------------------------------------------------------------------------- Compression Therapy Details Patient Name: [name], [name] Date of Service: 12/30/2020 9:30 AM Medical Record Number: [id number] Patient Account Number: [id number] Date of Birth/Sex: December 08, 1955 (65 y.o. M) Treating RN: Donnamarie Poag Primary Care Kayliegh Boyers: Fulton Reek Other Clinician: Referring Nioka Thorington: Fulton Reek Treating Owen Pagnotta/Extender: Skipper Cliche in Treatment: 0 Compression Therapy Performed for Wound Assessment: Wound #2 Left,Distal,Midline Lower Leg Performed By: Clinician Donnamarie Poag, RN Compression Type: Three Layer Electronic Signature(s) Signed: 12/30/2020 1:30:21 PM By: Donnamarie Poag Entered By: Donnamarie Poag on 12/30/2020 09:47:09 Dowding, Nanine Means ([id number]) -------------------------------------------------------------------------------- Encounter Discharge Information Details Patient Name: Alec Schooner A. Date of Service: 12/30/2020 9:30 AM Medical Record Number:  [id number] Patient Account Number: [id number] Date of Birth/Sex: [birth year] (65 y.o. M) Treating RN: Donnamarie Poag Primary Care Elliona Doddridge: Fulton Reek Other Clinician: Referring Art Levan: Fulton Reek Treating Ashiya Kinkead/Extender: Skipper Cliche in Treatment: 0 Encounter Discharge Information Items Discharge Condition: Stable Ambulatory Status: Ambulatory Discharge Destination: Home Transportation: Private Auto Accompanied By: wife Schedule Follow-up Appointment: Yes Clinical Summary of Care: Electronic Signature(s) Signed: 12/30/2020 1:30:21 PM By: Donnamarie Poag Entered By: Donnamarie Poag on 12/30/2020 09:47:45 Storrs, Nanine Means ([id number]) -------------------------------------------------------------------------------- Wound Assessment Details Patient Name: Cashwell, Breydan A. Date of Service: 12/30/2020 9:30 AM Medical Record Number: [id number] Patient Account Number: [id number] Date of Birth/Sex: [birth year] (66 y.o. M) Treating RN: Donnamarie Poag Primary Care Honour Schwieger: Fulton Reek Other Clinician: Referring Julee Stoll: Fulton Reek Treating Ryenne Lynam/Extender: Skipper Cliche in Treatment: 0 Wound Status Wound Number: 1 Primary Diabetic Wound/Ulcer of the Lower Extremity Etiology: Wound Location: Left, Plantar Foot Wound Open Wounding Event: Pressure Injury Status: Date Acquired: 05/14/2020 Comorbid Asthma, Chronic Obstructive Pulmonary Disease (COPD), Weeks Of Treatment: 0 History: Sleep Apnea, Coronary Artery Disease, Hypertension, Clustered Wound: No Myocardial Infarction, Peripheral Venous Disease, Colitis, Type II Diabetes, Gout, Osteoarthritis Wound Measurements Length: (cm) 0.8 Width: (cm) 0.9 Depth: (cm) 0.5 Area: (cm) 0.565 Volume: (cm) 0.283 % Reduction in Area: 0% % Reduction in Volume: 0% Wound Description Classification: Grade 2 Exudate Amount: Medium Exudate Type: Serosanguineous Exudate Color: red, brown Foul Odor After Cleansing:  No Slough/Fibrino Yes Wound Bed Granulation Amount: Medium (34-66%) Exposed Structure Granulation Quality: Pink Fascia Exposed: No Necrotic Amount: Medium (34-66%) Fat Layer (Subcutaneous Tissue) Exposed: Yes Necrotic Quality: Adherent Slough Tendon Exposed: No Muscle Exposed: No Joint Exposed: No Bone Exposed: No Treatment Notes Wound #1 (Foot) Wound Laterality: Plantar, Left Cleanser Soap and Water Discharge Instruction: Gently cleanse wound with antibacterial soap, rinse and pat dry prior to dressing wounds Peri-Wound Care Topical Primary Dressing Silvercel 4 1/4x 4 1/4 (in/in) Discharge Instruction: Apply Silvercel 4 1/4x 4 1/4 (in/in) as instructed Secondary Dressing Zetuvit Absorbent Pad, 4x4 (in/in) Discharge Instruction: ABSORBANT DRESSING OR ALTERNATE SUB Secured With Compression Wrap Profore Lite LF 3 Multilayer Compression [place], [place] ([id number]) Discharge Instruction: Apply 3 multi-layer wrap as prescribed. Compression Stockings Add-Ons Electronic Signature(s) Signed: 12/30/2020 1:30:21 PM By: Donnamarie Poag Entered ByDonnamarie Poag on 12/30/2020 09:46:33  Sher, ALEX MENDIOLA ([id number]) -------------------------------------------------------------------------------- Wound Assessment Details Patient Name: Reading, Javyon A. Date of Service: 12/30/2020 9:30 AM Medical Record Number: [id number] Patient Account Number: [id number] Date of Birth/Sex: 04/22/1956 (65 y.o. M) Treating RN: Donnamarie Poag Primary Care Perfecto Purdy: Fulton Reek Other Clinician: Referring Arlando Leisinger: Fulton Reek Treating Aashika Carta/Extender: Skipper Cliche in Treatment: 0 Wound Status Wound Number: 2 Primary Lymphedema Etiology: Wound Location: Left, Distal, Midline Lower Leg Wound Open Wounding Event: Trauma Status: Date Acquired: 11/30/2020 Comorbid Asthma, Chronic Obstructive Pulmonary Disease (COPD), Weeks Of Treatment: 0 History: Sleep Apnea, Coronary  Artery Disease, Hypertension, Clustered Wound: Yes Myocardial Infarction, Peripheral Venous Disease, Colitis, Type II Diabetes, Gout, Osteoarthritis Wound Measurements Length: (cm) 8 Width: (cm) 7 Depth: (cm) 0.1 Area: (cm) 43.982 Volume: (cm) 4.398 % Reduction in Area: 0% % Reduction in Volume: 0% Wound Description Classification: Full Thickness Without Exposed Support Structures Exudate Amount: Medium Exudate Type: Serosanguineous Exudate Color: red, brown Foul Odor After Cleansing: No Slough/Fibrino Yes Wound Bed Granulation Amount: Large (67-100%) Exposed Structure Granulation Quality: Red Fascia Exposed: No Necrotic Amount: Small (1-33%) Fat Layer (Subcutaneous Tissue) Exposed: Yes Necrotic Quality: Adherent Slough Tendon Exposed: No Muscle Exposed: No Joint Exposed: No Bone Exposed: No Treatment Notes Wound #2 (Lower Leg) Wound Laterality: Left, Midline, Distal Cleanser Soap and Water Discharge Instruction: Gently cleanse wound with antibacterial soap, rinse and pat dry prior to dressing wounds Wound Cleanser Discharge Instruction: Wash your hands with soap and water. Remove old dressing, discard into plastic bag and place into trash. Cleanse the wound with Wound Cleanser prior to applying a clean dressing using gauze sponges, not tissues or cotton balls. Do not scrub or use excessive force. Pat dry using gauze sponges, not tissue or cotton balls. Peri-Wound Care Topical Primary Dressing Silvercel 4 1/4x 4 1/4 (in/in) Discharge Instruction: Apply Silvercel 4 1/4x 4 1/4 (in/in) as instructed Secondary Dressing Zetuvit Absorbent Pad, 4x4 (in/in) Discharge Instruction: ABSORBANT DRESSING OR ALTERNATE SUB [place], [place] ([id number]) Secured With Compression Wrap Profore Lite LF 3 Multilayer Compression [place] Discharge Instruction: Apply 3 multi-layer wrap as prescribed. Compression Stockings Add-Ons Electronic Signature(s) Signed: 12/30/2020  1:30:21 PM By: Donnamarie Poag Entered By: Donnamarie Poag on 12/30/2020 09:46:40 Maxham, Nanine Means ([id number]) -------------------------------------------------------------------------------- Wound Assessment Details Patient Name: Moshier, Brodi A. Date of Service: 12/30/2020 9:30 AM Medical Record Number: [id number] Patient Account Number: [id number] Date of Birth/Sex: [birth year] (65 y.o. M) Treating RN: Donnamarie Poag Primary Care Manny Vitolo: Fulton Reek Other Clinician: Referring Daveyon Kitchings: Fulton Reek Treating Eulah Walkup/Extender: Skipper Cliche in Treatment: 0 Wound Status Wound Number: 3 Primary Lymphedema Etiology: Wound Location: Left, Distal, Medial Lower Leg Wound Open Wounding Event: Shear/Friction Status: Date Acquired: 12/18/2020 Comorbid Asthma, Chronic Obstructive Pulmonary Disease (COPD), Weeks Of Treatment: 0 History: Sleep Apnea, Coronary Artery Disease, Hypertension, Clustered Wound: Yes Myocardial Infarction, Peripheral Venous Disease, Colitis, Type II Diabetes, Gout, Osteoarthritis Wound Measurements Length: (cm) 3.5 Width: (cm) 4 Depth: (cm) 0.1 Area: (cm) 10.996 Volume: (cm) 1.1 % Reduction in Area: 0% % Reduction in Volume: 0% Wound Description Classification: Full Thickness Without Exposed Support Structures Exudate Amount: Medium Exudate Type: Serosanguineous Exudate Color: red, brown Foul Odor After Cleansing: No Slough/Fibrino Yes Wound Bed Granulation Amount: Large (67-100%) Exposed Structure Necrotic Amount: Small (1-33%) Fascia Exposed: No Necrotic Quality: Adherent Slough Fat Layer (Subcutaneous Tissue) Exposed: Yes Tendon Exposed: No Muscle Exposed: No Joint Exposed: No Bone Exposed: No Treatment Notes Wound #3 (Lower Leg) Wound Laterality: Left, Medial, Distal Cleanser Soap and Water Discharge Instruction: Gently cleanse wound  with antibacterial soap, rinse and pat dry prior to dressing wounds Wound Cleanser Discharge  Instruction: Wash your hands with soap and water. Remove old dressing, discard into plastic bag and place into trash. Cleanse the wound with Wound Cleanser prior to applying a clean dressing using gauze sponges, not tissues or cotton balls. Do not scrub or use excessive force. Pat dry using gauze sponges, not tissue or cotton balls. Peri-Wound Care Topical Primary Dressing Silvercel 4 1/4x 4 1/4 (in/in) Discharge Instruction: Apply Silvercel 4 1/4x 4 1/4 (in/in) as instructed Secondary Dressing Zetuvit Absorbent Pad, 4x4 (in/in) Discharge Instruction: ABSORBANT DRESSING OR ALTERNATE SUB [place], [place] ([id number]) Secured With Compression Wrap Profore Lite LF 3 Multilayer Compression [place] Discharge Instruction: Apply 3 multi-layer wrap as prescribed. Compression Stockings Circaid Juxta Lite Compression Wrap Quantity: 1 Left Leg Compression Amount: 30-40 mmHg Discharge Instruction: Apply Circaid Juxta Lite Compression Wrap as directed Add-Ons Electronic Signature(s) Signed: 12/30/2020 1:30:21 PM By: Donnamarie Poag Entered By: Donnamarie Poag on 12/30/2020 09:46:47 [place], [place]. ([id number]) -------------------------------------------------------------------------------- Wound Assessment Details Patient Name: Busby, Sirus A. Date of Service: 12/30/2020 9:30 AM Medical Record Number: [id number] Patient Account Number: [id number] Date of Birth/Sex: 1955-06-23 (65 y.o. M) Treating RN: Donnamarie Poag Primary Care Eldred Sooy: Fulton Reek Other Clinician: Referring Aribella Vavra: Fulton Reek Treating Caroleen Stoermer/Extender: Skipper Cliche in Treatment: 0 Wound Status Wound Number: 4 Primary Etiology: Lymphedema Wound Location: Right, Circumferential Lower Leg Secondary Etiology: Venous Leg Ulcer Wounding Event: Blister Wound Status: Open Date Acquired: 11/11/2020 Weeks Of Treatment: 0 Clustered Wound: Yes Wound Measurements Length: (cm) 16 Width: (cm) 9 Depth: (cm)  0.1 Area: (cm) 113.097 Volume: (cm) 11.31 % Reduction in Area: 0% % Reduction in Volume: 0% Wound Description Classification: Full Thickness Without Exposed Support Structu Exudate Amount: Large Exudate Type: Serous Exudate Color: amber res Treatment Notes Wound #4 (Lower Leg) Wound Laterality: Right, Circumferential Cleanser Soap and Water Discharge Instruction: Gently cleanse wound with antibacterial soap, rinse and pat dry prior to dressing wounds Wound Cleanser Discharge Instruction: Wash your hands with soap and water. Remove old dressing, discard into plastic bag and place into trash. Cleanse the wound with Wound Cleanser prior to applying a clean dressing using gauze sponges, not tissues or cotton balls. Do not scrub or use excessive force. Pat dry using gauze sponges, not tissue or cotton balls. Peri-Wound Care Topical Primary Dressing Silvercel 4 1/4x 4 1/4 (in/in) Discharge Instruction: Apply Silvercel 4 1/4x 4 1/4 (in/in) as instructed Secondary Dressing Zetuvit Absorbent Pad, 4x4 (in/in) Discharge Instruction: ABSORBANT DRESSING OR ALTERNATE SUB Secured With Compression Wrap Profore Lite LF 3 Multilayer Compression Bandaging System Discharge Instruction: Apply 3 multi-layer wrap as prescribed. Compression Stockings Circaid Juxta Lite Compression Wrap Quantity: 1 Right Leg Compression Amount: 30-40 mmHg Garza, Kamden A. ([id number]) Discharge Instruction: Apply Circaid Juxta Lite Compression Wrap as directed Add-Ons Electronic Signature(s) Signed: 12/30/2020 1:30:21 PM By: Donnamarie Poag Entered By: Donnamarie Poag on 12/30/2020 09:46:28 Thornley, Shaydon Loni Muse ([id number]) -------------------------------------------------------------------------------- Wound Assessment Details Patient Name: Grassia, Zedrick A. Date of Service: 12/30/2020 9:30 AM Medical Record Number: [id number] Patient Account Number: [id number] Date of Birth/Sex: Jul 07, 1955 (65 y.o. M) Treating RN:  Donnamarie Poag Primary Care Yama Nielson: Fulton Reek Other Clinician: Referring Nyemah Watton: Fulton Reek Treating Paytin Ramakrishnan/Extender: Skipper Cliche in Treatment: 0 Wound Status Wound Number: 5 Primary Etiology: Diabetic Wound/Ulcer of the Lower Extremity Wound Location: Right, Anterior Toe Fifth Wound Status: Open Wounding Event: Shear/Friction Date Acquired: 12/23/2020 Weeks Of Treatment: 0 Clustered Wound: No Wound Measurements Length: (cm)  0.74 Width: (cm) 0.4 Depth: (cm) 0.1 Area: (cm) 0.232 Volume: (cm) 0.023 % Reduction in Area: -5.5% % Reduction in Volume: -4.5% Wound Description Classification: Grade 2 Exudate Amount: Medium Exudate Type: Serosanguineous Exudate Color: red, brown Treatment Notes Wound #5 (Toe Fifth) Wound Laterality: Right, Anterior Cleanser Soap and Water Discharge Instruction: Gently cleanse wound with antibacterial soap, rinse and pat dry prior to dressing wounds Wound Cleanser Discharge Instruction: Wash your hands with soap and water. Remove old dressing, discard into plastic bag and place into trash. Cleanse the wound with Wound Cleanser prior to applying a clean dressing using gauze sponges, not tissues or cotton balls. Do not scrub or use excessive force. Pat dry using gauze sponges, not tissue or cotton balls. Peri-Wound Care Topical Primary Dressing Silvercel 4 1/4x 4 1/4 (in/in) Discharge Instruction: Apply Silvercel 4 1/4x 4 1/4 (in/in) as instructed Secondary Dressing Zetuvit Absorbent Pad, 4x4 (in/in) Discharge Instruction: ABSORBANT DRESSING OR ALTERNATE SUB Secured With Compression Wrap Profore Lite LF 3 Multilayer Compression Bandaging System Discharge Instruction: Apply 3 multi-layer wrap as prescribed. Compression Stockings Add-Ons Electronic Signature(s) Blake, KEIMON ZEPPIERI ([id number]) Signed: 12/30/2020 1:30:21 PM By: Donnamarie Poag Entered By: Donnamarie Poag on 12/30/2020 09:46:28 [place], Nanine Means  ([id number]) -------------------------------------------------------------------------------- Wound Assessment Details Patient Name: Shawler, Tedrick A. Date of Service: 12/30/2020 9:30 AM Medical Record Number: [id number] Patient Account Number: [id number] Date of Birth/Sex: 1955/12/25 (65 y.o. M) Treating RN: Donnamarie Poag Primary Care Charlee Squibb: Fulton Reek Other Clinician: Referring Jahmari Esbenshade: Fulton Reek Treating Lyal Husted/Extender: Skipper Cliche in Treatment: 0 Wound Status Wound Number: 6 Primary Etiology: Trauma, Other Wound Location: Right, Anterior Knee Wound Status: Open Wounding Event: Skin Tear/Laceration Date Acquired: 11/30/2020 Weeks Of Treatment: 0 Clustered Wound: No Wound Measurements Length: (cm) 1.3 Width: (cm) 1.9 Depth: (cm) 0.1 Area: (cm) 1.94 Volume: (cm) 0.194 % Reduction in Area: 0% % Reduction in Volume: 0% Wound Description Classification: Full Thickness Without Exposed Support Structu Exudate Amount: Medium Exudate Type: Serosanguineous Exudate Color: red, brown res Treatment Notes Wound #6 (Knee) Wound Laterality: Right, Anterior Cleanser Soap and Water Discharge Instruction: Gently cleanse wound with antibacterial soap, rinse and pat dry prior to dressing wounds Wound Cleanser Discharge Instruction: Wash your hands with soap and water. Remove old dressing, discard into plastic bag and place into trash. Cleanse the wound with Wound Cleanser prior to applying a clean dressing using gauze sponges, not tissues or cotton balls. Do not scrub or use excessive force. Pat dry using gauze sponges, not tissue or cotton balls. Peri-Wound Care Topical Primary Dressing Silvercel 4 1/4x 4 1/4 (in/in) Discharge Instruction: Apply Silvercel 4 1/4x 4 1/4 (in/in) as instructed Secondary Dressing Zetuvit Absorbent Pad, 4x4 (in/in) Discharge Instruction: ABSORBANT DRESSING OR ALTERNATE SUB Secured With Compression Wrap Profore Lite LF 3 Multilayer  Compression Bandaging System Discharge Instruction: Apply 3 multi-layer wrap as prescribed. Compression Stockings Add-Ons Electronic Signature(s) Scheck, OLLY BURKHARD ([id number]) Signed: 12/30/2020 1:30:21 PM By: Donnamarie Poag Entered ByDonnamarie Poag on 12/30/2020 09:46:28

## 2021-01-02 DIAGNOSIS — E1151 Type 2 diabetes mellitus with diabetic peripheral angiopathy without gangrene: Secondary | ICD-10-CM | POA: Diagnosis not present

## 2021-01-02 DIAGNOSIS — I252 Old myocardial infarction: Secondary | ICD-10-CM | POA: Diagnosis not present

## 2021-01-02 DIAGNOSIS — G4730 Sleep apnea, unspecified: Secondary | ICD-10-CM | POA: Diagnosis not present

## 2021-01-02 DIAGNOSIS — I10 Essential (primary) hypertension: Secondary | ICD-10-CM | POA: Diagnosis not present

## 2021-01-02 DIAGNOSIS — E785 Hyperlipidemia, unspecified: Secondary | ICD-10-CM | POA: Diagnosis not present

## 2021-01-02 DIAGNOSIS — J45909 Unspecified asthma, uncomplicated: Secondary | ICD-10-CM | POA: Diagnosis not present

## 2021-01-02 DIAGNOSIS — M109 Gout, unspecified: Secondary | ICD-10-CM | POA: Diagnosis not present

## 2021-01-02 DIAGNOSIS — Z4781 Encounter for orthopedic aftercare following surgical amputation: Secondary | ICD-10-CM | POA: Diagnosis not present

## 2021-01-02 DIAGNOSIS — L89892 Pressure ulcer of other site, stage 2: Secondary | ICD-10-CM | POA: Diagnosis not present

## 2021-01-02 DIAGNOSIS — I70249 Atherosclerosis of native arteries of left leg with ulceration of unspecified site: Secondary | ICD-10-CM | POA: Diagnosis not present

## 2021-01-02 DIAGNOSIS — I2510 Atherosclerotic heart disease of native coronary artery without angina pectoris: Secondary | ICD-10-CM | POA: Diagnosis not present

## 2021-01-02 DIAGNOSIS — M1991 Primary osteoarthritis, unspecified site: Secondary | ICD-10-CM | POA: Diagnosis not present

## 2021-01-02 DIAGNOSIS — Z7901 Long term (current) use of anticoagulants: Secondary | ICD-10-CM | POA: Diagnosis not present

## 2021-01-02 DIAGNOSIS — Z7902 Long term (current) use of antithrombotics/antiplatelets: Secondary | ICD-10-CM | POA: Diagnosis not present

## 2021-01-02 DIAGNOSIS — F32A Depression, unspecified: Secondary | ICD-10-CM | POA: Diagnosis not present

## 2021-01-03 ENCOUNTER — Emergency Department: Payer: Medicare Other

## 2021-01-03 ENCOUNTER — Inpatient Hospital Stay
Admission: EM | Admit: 2021-01-03 | Discharge: 2021-01-19 | DRG: 853 | Disposition: A | Discharge status: Skilled Nursing Facility | Payer: Medicare Other | Attending: Internal Medicine | Admitting: Internal Medicine

## 2021-01-03 DIAGNOSIS — B951 Streptococcus, group B, as the cause of diseases classified elsewhere: Secondary | ICD-10-CM | POA: Diagnosis present

## 2021-01-03 DIAGNOSIS — G2581 Restless legs syndrome: Secondary | ICD-10-CM | POA: Diagnosis not present

## 2021-01-03 DIAGNOSIS — I10 Essential (primary) hypertension: Secondary | ICD-10-CM | POA: Diagnosis not present

## 2021-01-03 DIAGNOSIS — Z811 Family history of alcohol abuse and dependence: Secondary | ICD-10-CM

## 2021-01-03 DIAGNOSIS — Z7401 Bed confinement status: Secondary | ICD-10-CM | POA: Diagnosis not present

## 2021-01-03 DIAGNOSIS — Z888 Allergy status to other drugs, medicaments and biological substances status: Secondary | ICD-10-CM

## 2021-01-03 DIAGNOSIS — I70245 Atherosclerosis of native arteries of left leg with ulceration of other part of foot: Secondary | ICD-10-CM | POA: Diagnosis not present

## 2021-01-03 DIAGNOSIS — E7800 Pure hypercholesterolemia, unspecified: Secondary | ICD-10-CM | POA: Diagnosis present

## 2021-01-03 DIAGNOSIS — L0390 Cellulitis, unspecified: Secondary | ICD-10-CM

## 2021-01-03 DIAGNOSIS — M79606 Pain in leg, unspecified: Secondary | ICD-10-CM

## 2021-01-03 DIAGNOSIS — E1169 Type 2 diabetes mellitus with other specified complication: Secondary | ICD-10-CM | POA: Diagnosis present

## 2021-01-03 DIAGNOSIS — Z89421 Acquired absence of other right toe(s): Secondary | ICD-10-CM | POA: Diagnosis not present

## 2021-01-03 DIAGNOSIS — Z800 Family history of malignant neoplasm of digestive organs: Secondary | ICD-10-CM

## 2021-01-03 DIAGNOSIS — E11621 Type 2 diabetes mellitus with foot ulcer: Secondary | ICD-10-CM | POA: Diagnosis present

## 2021-01-03 DIAGNOSIS — Z794 Long term (current) use of insulin: Secondary | ICD-10-CM

## 2021-01-03 DIAGNOSIS — E669 Obesity, unspecified: Secondary | ICD-10-CM

## 2021-01-03 DIAGNOSIS — M86672 Other chronic osteomyelitis, left ankle and foot: Secondary | ICD-10-CM | POA: Diagnosis not present

## 2021-01-03 DIAGNOSIS — E1151 Type 2 diabetes mellitus with diabetic peripheral angiopathy without gangrene: Secondary | ICD-10-CM | POA: Diagnosis present

## 2021-01-03 DIAGNOSIS — M6281 Muscle weakness (generalized): Secondary | ICD-10-CM | POA: Diagnosis not present

## 2021-01-03 DIAGNOSIS — D649 Anemia, unspecified: Secondary | ICD-10-CM | POA: Diagnosis not present

## 2021-01-03 DIAGNOSIS — E785 Hyperlipidemia, unspecified: Secondary | ICD-10-CM | POA: Diagnosis not present

## 2021-01-03 DIAGNOSIS — Z7982 Long term (current) use of aspirin: Secondary | ICD-10-CM

## 2021-01-03 DIAGNOSIS — R2681 Unsteadiness on feet: Secondary | ICD-10-CM | POA: Diagnosis not present

## 2021-01-03 DIAGNOSIS — I872 Venous insufficiency (chronic) (peripheral): Secondary | ICD-10-CM | POA: Diagnosis not present

## 2021-01-03 DIAGNOSIS — I878 Other specified disorders of veins: Secondary | ICD-10-CM | POA: Diagnosis present

## 2021-01-03 DIAGNOSIS — Z7901 Long term (current) use of anticoagulants: Secondary | ICD-10-CM

## 2021-01-03 DIAGNOSIS — R0902 Hypoxemia: Secondary | ICD-10-CM | POA: Diagnosis not present

## 2021-01-03 DIAGNOSIS — I70248 Atherosclerosis of native arteries of left leg with ulceration of other part of lower left leg: Secondary | ICD-10-CM | POA: Diagnosis not present

## 2021-01-03 DIAGNOSIS — R0602 Shortness of breath: Secondary | ICD-10-CM | POA: Diagnosis not present

## 2021-01-03 DIAGNOSIS — Z9889 Other specified postprocedural states: Secondary | ICD-10-CM | POA: Diagnosis not present

## 2021-01-03 DIAGNOSIS — M6702 Short Achilles tendon (acquired), left ankle: Secondary | ICD-10-CM | POA: Diagnosis not present

## 2021-01-03 DIAGNOSIS — Z20822 Contact with and (suspected) exposure to covid-19: Secondary | ICD-10-CM | POA: Diagnosis present

## 2021-01-03 DIAGNOSIS — Z9119 Patient's noncompliance with other medical treatment and regimen: Secondary | ICD-10-CM

## 2021-01-03 DIAGNOSIS — J810 Acute pulmonary edema: Secondary | ICD-10-CM | POA: Diagnosis not present

## 2021-01-03 DIAGNOSIS — Z6841 Body Mass Index (BMI) 40.0 and over, adult: Secondary | ICD-10-CM | POA: Diagnosis not present

## 2021-01-03 DIAGNOSIS — N400 Enlarged prostate without lower urinary tract symptoms: Secondary | ICD-10-CM | POA: Diagnosis not present

## 2021-01-03 DIAGNOSIS — L089 Local infection of the skin and subcutaneous tissue, unspecified: Secondary | ICD-10-CM | POA: Diagnosis not present

## 2021-01-03 DIAGNOSIS — I83001 Varicose veins of unspecified lower extremity with ulcer of thigh: Secondary | ICD-10-CM | POA: Diagnosis not present

## 2021-01-03 DIAGNOSIS — L03116 Cellulitis of left lower limb: Secondary | ICD-10-CM | POA: Diagnosis present

## 2021-01-03 DIAGNOSIS — I4819 Other persistent atrial fibrillation: Secondary | ICD-10-CM | POA: Diagnosis not present

## 2021-01-03 DIAGNOSIS — L97529 Non-pressure chronic ulcer of other part of left foot with unspecified severity: Secondary | ICD-10-CM | POA: Diagnosis not present

## 2021-01-03 DIAGNOSIS — A408 Other streptococcal sepsis: Principal | ICD-10-CM | POA: Diagnosis present

## 2021-01-03 DIAGNOSIS — I4891 Unspecified atrial fibrillation: Secondary | ICD-10-CM | POA: Diagnosis not present

## 2021-01-03 DIAGNOSIS — L03119 Cellulitis of unspecified part of limb: Secondary | ICD-10-CM

## 2021-01-03 DIAGNOSIS — R739 Hyperglycemia, unspecified: Secondary | ICD-10-CM | POA: Diagnosis not present

## 2021-01-03 DIAGNOSIS — R0689 Other abnormalities of breathing: Secondary | ICD-10-CM | POA: Diagnosis not present

## 2021-01-03 DIAGNOSIS — E1165 Type 2 diabetes mellitus with hyperglycemia: Secondary | ICD-10-CM | POA: Diagnosis present

## 2021-01-03 DIAGNOSIS — I517 Cardiomegaly: Secondary | ICD-10-CM | POA: Diagnosis not present

## 2021-01-03 DIAGNOSIS — L03115 Cellulitis of right lower limb: Secondary | ICD-10-CM | POA: Diagnosis not present

## 2021-01-03 DIAGNOSIS — Z7984 Long term (current) use of oral hypoglycemic drugs: Secondary | ICD-10-CM

## 2021-01-03 DIAGNOSIS — R7881 Bacteremia: Secondary | ICD-10-CM | POA: Diagnosis not present

## 2021-01-03 DIAGNOSIS — Z951 Presence of aortocoronary bypass graft: Secondary | ICD-10-CM

## 2021-01-03 DIAGNOSIS — E1142 Type 2 diabetes mellitus with diabetic polyneuropathy: Secondary | ICD-10-CM | POA: Diagnosis not present

## 2021-01-03 DIAGNOSIS — I25810 Atherosclerosis of coronary artery bypass graft(s) without angina pectoris: Secondary | ICD-10-CM | POA: Diagnosis present

## 2021-01-03 DIAGNOSIS — G4730 Sleep apnea, unspecified: Secondary | ICD-10-CM | POA: Diagnosis not present

## 2021-01-03 DIAGNOSIS — M7989 Other specified soft tissue disorders: Secondary | ICD-10-CM | POA: Diagnosis not present

## 2021-01-03 DIAGNOSIS — I330 Acute and subacute infective endocarditis: Secondary | ICD-10-CM | POA: Diagnosis not present

## 2021-01-03 DIAGNOSIS — A419 Sepsis, unspecified organism: Secondary | ICD-10-CM | POA: Diagnosis not present

## 2021-01-03 DIAGNOSIS — I70249 Atherosclerosis of native arteries of left leg with ulceration of unspecified site: Secondary | ICD-10-CM | POA: Diagnosis not present

## 2021-01-03 DIAGNOSIS — Z87891 Personal history of nicotine dependence: Secondary | ICD-10-CM

## 2021-01-03 DIAGNOSIS — E1152 Type 2 diabetes mellitus with diabetic peripheral angiopathy with gangrene: Secondary | ICD-10-CM | POA: Diagnosis present

## 2021-01-03 DIAGNOSIS — I358 Other nonrheumatic aortic valve disorders: Secondary | ICD-10-CM | POA: Diagnosis present

## 2021-01-03 DIAGNOSIS — Z79899 Other long term (current) drug therapy: Secondary | ICD-10-CM

## 2021-01-03 DIAGNOSIS — Z4781 Encounter for orthopedic aftercare following surgical amputation: Secondary | ICD-10-CM | POA: Diagnosis not present

## 2021-01-03 DIAGNOSIS — M869 Osteomyelitis, unspecified: Secondary | ICD-10-CM | POA: Diagnosis not present

## 2021-01-03 DIAGNOSIS — Z825 Family history of asthma and other chronic lower respiratory diseases: Secondary | ICD-10-CM

## 2021-01-03 DIAGNOSIS — G4733 Obstructive sleep apnea (adult) (pediatric): Secondary | ICD-10-CM | POA: Diagnosis present

## 2021-01-03 DIAGNOSIS — I2510 Atherosclerotic heart disease of native coronary artery without angina pectoris: Secondary | ICD-10-CM | POA: Diagnosis present

## 2021-01-03 DIAGNOSIS — Z955 Presence of coronary angioplasty implant and graft: Secondary | ICD-10-CM

## 2021-01-03 DIAGNOSIS — B955 Unspecified streptococcus as the cause of diseases classified elsewhere: Secondary | ICD-10-CM | POA: Diagnosis not present

## 2021-01-03 DIAGNOSIS — Z7902 Long term (current) use of antithrombotics/antiplatelets: Secondary | ICD-10-CM

## 2021-01-03 DIAGNOSIS — L97509 Non-pressure chronic ulcer of other part of unspecified foot with unspecified severity: Secondary | ICD-10-CM | POA: Diagnosis not present

## 2021-01-03 DIAGNOSIS — Z833 Family history of diabetes mellitus: Secondary | ICD-10-CM

## 2021-01-03 DIAGNOSIS — E119 Type 2 diabetes mellitus without complications: Secondary | ICD-10-CM | POA: Diagnosis not present

## 2021-01-03 DIAGNOSIS — Z823 Family history of stroke: Secondary | ICD-10-CM

## 2021-01-03 DIAGNOSIS — E11628 Type 2 diabetes mellitus with other skin complications: Secondary | ICD-10-CM | POA: Diagnosis not present

## 2021-01-03 DIAGNOSIS — M609 Myositis, unspecified: Secondary | ICD-10-CM | POA: Diagnosis present

## 2021-01-03 DIAGNOSIS — Z89422 Acquired absence of other left toe(s): Secondary | ICD-10-CM

## 2021-01-03 DIAGNOSIS — Z736 Limitation of activities due to disability: Secondary | ICD-10-CM | POA: Diagnosis not present

## 2021-01-03 DIAGNOSIS — L89892 Pressure ulcer of other site, stage 2: Secondary | ICD-10-CM | POA: Diagnosis not present

## 2021-01-03 DIAGNOSIS — R0600 Dyspnea, unspecified: Secondary | ICD-10-CM | POA: Diagnosis not present

## 2021-01-03 DIAGNOSIS — S81801D Unspecified open wound, right lower leg, subsequent encounter: Secondary | ICD-10-CM | POA: Diagnosis not present

## 2021-01-03 DIAGNOSIS — R000 Tachycardia, unspecified: Secondary | ICD-10-CM | POA: Diagnosis not present

## 2021-01-03 DIAGNOSIS — X58XXXA Exposure to other specified factors, initial encounter: Secondary | ICD-10-CM | POA: Diagnosis present

## 2021-01-03 DIAGNOSIS — Z09 Encounter for follow-up examination after completed treatment for conditions other than malignant neoplasm: Secondary | ICD-10-CM

## 2021-01-03 DIAGNOSIS — Z8249 Family history of ischemic heart disease and other diseases of the circulatory system: Secondary | ICD-10-CM

## 2021-01-03 DIAGNOSIS — J811 Chronic pulmonary edema: Secondary | ICD-10-CM | POA: Diagnosis not present

## 2021-01-03 DIAGNOSIS — R21 Rash and other nonspecific skin eruption: Secondary | ICD-10-CM | POA: Diagnosis present

## 2021-01-03 DIAGNOSIS — I252 Old myocardial infarction: Secondary | ICD-10-CM

## 2021-01-03 DIAGNOSIS — R55 Syncope and collapse: Secondary | ICD-10-CM | POA: Diagnosis not present

## 2021-01-03 DIAGNOSIS — I4510 Unspecified right bundle-branch block: Secondary | ICD-10-CM | POA: Diagnosis present

## 2021-01-03 DIAGNOSIS — S90822A Blister (nonthermal), left foot, initial encounter: Secondary | ICD-10-CM | POA: Diagnosis present

## 2021-01-03 DIAGNOSIS — L97522 Non-pressure chronic ulcer of other part of left foot with fat layer exposed: Secondary | ICD-10-CM | POA: Diagnosis not present

## 2021-01-03 LAB — COMPREHENSIVE METABOLIC PANEL
ALT: 16 U/L (ref 0–44)
AST: 18 U/L (ref 15–41)
Albumin: 3.4 g/dL — ABNORMAL LOW (ref 3.5–5.0)
Alkaline Phosphatase: 103 U/L (ref 38–126)
Anion gap: 9 (ref 5–15)
BUN: 21 mg/dL (ref 8–23)
CO2: 27 mmol/L (ref 22–32)
Calcium: 8.8 mg/dL — ABNORMAL LOW (ref 8.9–10.3)
Chloride: 99 mmol/L (ref 98–111)
Creatinine, Ser: 0.85 mg/dL (ref 0.61–1.24)
GFR, Estimated: >60 mL/min (ref >60)
Glucose, Bld: 269 mg/dL — ABNORMAL HIGH (ref 70–99)
Potassium: 4.4 mmol/L (ref 3.5–5.1)
Sodium: 135 mmol/L (ref 135–145)
Total Bilirubin: 0.6 mg/dL (ref 0.3–1.2)
Total Protein: 7.3 g/dL (ref 6.5–8.1)

## 2021-01-03 LAB — URINALYSIS, COMPLETE (UACMP) WITH MICROSCOPIC
Bacteria, UA: NONE SEEN
Bilirubin Urine: NEGATIVE
Glucose, UA: 500 mg/dL — AB
Ketones, ur: 40 mg/dL — AB
Leukocytes,Ua: NEGATIVE
Nitrite: NEGATIVE
Protein, ur: >300 mg/dL — AB
Specific Gravity, Urine: 1.025 (ref 1.005–1.030)
pH: 7 (ref 5.0–8.0)

## 2021-01-03 LAB — CBG MONITORING, ED: Glucose-Capillary: 291 mg/dL — ABNORMAL HIGH (ref 70–99)

## 2021-01-03 LAB — BLOOD GAS, VENOUS
Acid-Base Excess: 6.2 mmol/L — ABNORMAL HIGH (ref 0.0–2.0)
Bicarbonate: 30.6 mmol/L — ABNORMAL HIGH (ref 20.0–28.0)
O2 Saturation: 73.5 %
Patient temperature: 37
pCO2, Ven: 42 mmHg — ABNORMAL LOW (ref 44.0–60.0)
pH, Ven: 7.47 — ABNORMAL HIGH (ref 7.250–7.430)
pO2, Ven: 36 mmHg (ref 32.0–45.0)

## 2021-01-03 LAB — LACTIC ACID, PLASMA
Lactic Acid, Venous: 1.7 mmol/L (ref 0.5–1.9)
Lactic Acid, Venous: 1.1 mmol/L (ref 0.5–1.9)

## 2021-01-03 LAB — BRAIN NATRIURETIC PEPTIDE: B Natriuretic Peptide: 197.5 pg/mL — ABNORMAL HIGH (ref 0.0–100.0)

## 2021-01-03 LAB — CBC WITH DIFFERENTIAL/PLATELET
Abs Immature Granulocytes: 0.06 10*3/uL (ref 0.00–0.07)
Basophils Absolute: 0.1 10*3/uL (ref 0.0–0.1)
Basophils Relative: 1 %
Eosinophils Absolute: 0.1 10*3/uL (ref 0.0–0.5)
Eosinophils Relative: 0 %
HCT: 43.7 % (ref 39.0–52.0)
Hemoglobin: 14.6 g/dL (ref 13.0–17.0)
Immature Granulocytes: 1 %
Lymphocytes Relative: 4 %
Lymphs Abs: 0.5 10*3/uL — ABNORMAL LOW (ref 0.7–4.0)
MCH: 30.5 pg (ref 26.0–34.0)
MCHC: 33.4 g/dL (ref 30.0–36.0)
MCV: 91.2 fL (ref 80.0–100.0)
Monocytes Absolute: 0.3 10*3/uL (ref 0.1–1.0)
Monocytes Relative: 3 %
Neutro Abs: 10.5 10*3/uL — ABNORMAL HIGH (ref 1.7–7.7)
Neutrophils Relative %: 91 %
Platelets: 150 10*3/uL (ref 150–400)
RBC: 4.79 MIL/uL (ref 4.22–5.81)
RDW: 13.9 % (ref 11.5–15.5)
WBC: 11.4 10*3/uL — ABNORMAL HIGH (ref 4.0–10.5)
nRBC: 0 % (ref 0.0–0.2)

## 2021-01-03 LAB — TROPONIN I (HIGH SENSITIVITY)
Troponin I (High Sensitivity): 11 ng/L (ref <18)
Troponin I (High Sensitivity): 22 ng/L — ABNORMAL HIGH (ref <18)

## 2021-01-03 LAB — RESP PANEL BY RT-PCR (FLU A&B, COVID) ARPGX2
Influenza A by PCR: NEGATIVE
Influenza B by PCR: NEGATIVE
SARS Coronavirus 2 by RT PCR: NEGATIVE

## 2021-01-03 LAB — PROTIME-INR
INR: 1 (ref 0.8–1.2)
Prothrombin Time: 13.3 seconds (ref 11.4–15.2)

## 2021-01-03 LAB — APTT: aPTT: 30 seconds (ref 24–36)

## 2021-01-03 MED ORDER — SODIUM CHLORIDE 0.9 % IV SOLN
INTRAVENOUS | Status: DC
Start: 2021-01-03 — End: 2021-01-05

## 2021-01-03 MED ORDER — METRONIDAZOLE 500 MG/100ML IV SOLN
500.0000 mg | Freq: Three times a day (TID) | INTRAVENOUS | Status: DC
  Administered 2021-01-03 – 2021-01-04 (×2): 500 mg via INTRAVENOUS
  Filled 2021-01-03 (×5): qty 100

## 2021-01-03 MED ORDER — SODIUM CHLORIDE 0.9 % IV SOLN
2.0000 g | Freq: Once | INTRAVENOUS | Status: AC
  Administered 2021-01-03: 2 g via INTRAVENOUS
  Filled 2021-01-03: qty 2

## 2021-01-03 MED ORDER — ACETAMINOPHEN 325 MG PO TABS
650.0000 mg | ORAL_TABLET | Freq: Four times a day (QID) | ORAL | Status: DC | PRN
  Administered 2021-01-04 – 2021-01-18 (×16): 650 mg via ORAL
  Filled 2021-01-03 (×16): qty 2

## 2021-01-03 MED ORDER — INSULIN ASPART 100 UNIT/ML IJ SOLN
0.0000 [IU] | Freq: Every day | INTRAMUSCULAR | Status: DC
  Administered 2021-01-03: 3 [IU] via SUBCUTANEOUS
  Administered 2021-01-04: 4 [IU] via SUBCUTANEOUS
  Administered 2021-01-05: 5 [IU] via SUBCUTANEOUS
  Administered 2021-01-06: 4 [IU] via SUBCUTANEOUS
  Administered 2021-01-07: 3 [IU] via SUBCUTANEOUS
  Administered 2021-01-08: 5 [IU] via SUBCUTANEOUS
  Administered 2021-01-09 – 2021-01-10 (×2): 3 [IU] via SUBCUTANEOUS
  Administered 2021-01-11: 5 [IU] via SUBCUTANEOUS
  Administered 2021-01-12: 3 [IU] via SUBCUTANEOUS
  Administered 2021-01-13: 4 [IU] via SUBCUTANEOUS
  Administered 2021-01-14: 5 [IU] via SUBCUTANEOUS
  Administered 2021-01-15: 4 [IU] via SUBCUTANEOUS
  Administered 2021-01-16: 2 [IU] via SUBCUTANEOUS
  Filled 2021-01-03 (×16): qty 1

## 2021-01-03 MED ORDER — PIPERACILLIN-TAZOBACTAM 3.375 G IVPB
3.3750 g | Freq: Three times a day (TID) | INTRAVENOUS | Status: DC
  Administered 2021-01-04 (×2): 3.375 g via INTRAVENOUS
  Filled 2021-01-03 (×2): qty 50

## 2021-01-03 MED ORDER — ACETAMINOPHEN 650 MG RE SUPP: 650.0000 mg | Freq: Four times a day (QID) | RECTAL | Status: DC | PRN

## 2021-01-03 MED ORDER — VANCOMYCIN HCL 2000 MG/400ML IV SOLN
2000.0000 mg | Freq: Once | INTRAVENOUS | Status: AC
  Administered 2021-01-03: 2000 mg via INTRAVENOUS
  Filled 2021-01-03: qty 400

## 2021-01-03 MED ORDER — SODIUM CHLORIDE 0.9 % IV SOLN: Freq: Once | INTRAVENOUS | Status: AC

## 2021-01-03 MED ORDER — VANCOMYCIN HCL 500 MG/100ML IV SOLN
500.0000 mg | Freq: Once | INTRAVENOUS | Status: AC
  Administered 2021-01-03: 500 mg via INTRAVENOUS
  Filled 2021-01-03: qty 100

## 2021-01-03 MED ORDER — ONDANSETRON HCL 4 MG PO TABS: 4.0000 mg | ORAL_TABLET | Freq: Four times a day (QID) | ORAL | Status: DC | PRN

## 2021-01-03 MED ORDER — VANCOMYCIN HCL 1750 MG/350ML IV SOLN
1750.0000 mg | Freq: Two times a day (BID) | INTRAVENOUS | Status: DC
  Filled 2021-01-03: qty 350

## 2021-01-03 MED ORDER — ASPIRIN EC 81 MG PO TBEC
81.0000 mg | DELAYED_RELEASE_TABLET | Freq: Every day | ORAL | Status: DC
  Administered 2021-01-04: 81 mg via ORAL
  Filled 2021-01-03: qty 1

## 2021-01-03 MED ORDER — ACETAMINOPHEN 325 MG PO TABS
650.0000 mg | ORAL_TABLET | Freq: Once | ORAL | Status: AC
  Administered 2021-01-03: 650 mg via ORAL
  Filled 2021-01-03: qty 2

## 2021-01-03 MED ORDER — ONDANSETRON HCL 4 MG/2ML IJ SOLN: 4.0000 mg | Freq: Four times a day (QID) | INTRAMUSCULAR | Status: DC | PRN

## 2021-01-03 MED ORDER — INSULIN ASPART 100 UNIT/ML IJ SOLN
0.0000 [IU] | Freq: Three times a day (TID) | INTRAMUSCULAR | Status: DC
  Administered 2021-01-04: 4 [IU] via SUBCUTANEOUS
  Administered 2021-01-04: 5 [IU] via SUBCUTANEOUS
  Administered 2021-01-05: 8 [IU] via SUBCUTANEOUS
  Administered 2021-01-05 (×2): 15 [IU] via SUBCUTANEOUS
  Administered 2021-01-06: 8 [IU] via SUBCUTANEOUS
  Administered 2021-01-06 (×2): 5 [IU] via SUBCUTANEOUS
  Administered 2021-01-07: 11 [IU] via SUBCUTANEOUS
  Administered 2021-01-07: 8 [IU] via SUBCUTANEOUS
  Administered 2021-01-07: 11 [IU] via SUBCUTANEOUS
  Administered 2021-01-08: 8 [IU] via SUBCUTANEOUS
  Administered 2021-01-08: 11 [IU] via SUBCUTANEOUS
  Administered 2021-01-08: 8 [IU] via SUBCUTANEOUS
  Administered 2021-01-09: 5 [IU] via SUBCUTANEOUS
  Administered 2021-01-09: 8 [IU] via SUBCUTANEOUS
  Administered 2021-01-10: 15 [IU] via SUBCUTANEOUS
  Administered 2021-01-10: 11 [IU] via SUBCUTANEOUS
  Administered 2021-01-11: 5 [IU] via SUBCUTANEOUS
  Administered 2021-01-11 (×2): 11 [IU] via SUBCUTANEOUS
  Administered 2021-01-12: 8 [IU] via SUBCUTANEOUS
  Administered 2021-01-12: 3 [IU] via SUBCUTANEOUS
  Administered 2021-01-12: 8 [IU] via SUBCUTANEOUS
  Administered 2021-01-13: 3 [IU] via SUBCUTANEOUS
  Administered 2021-01-13: 2 [IU] via SUBCUTANEOUS
  Administered 2021-01-13 – 2021-01-14 (×2): 5 [IU] via SUBCUTANEOUS
  Administered 2021-01-14: 8 [IU] via SUBCUTANEOUS
  Administered 2021-01-14: 11 [IU] via SUBCUTANEOUS
  Administered 2021-01-15: 8 [IU] via SUBCUTANEOUS
  Administered 2021-01-15: 15 [IU] via SUBCUTANEOUS
  Administered 2021-01-15: 8 [IU] via SUBCUTANEOUS
  Administered 2021-01-16 (×2): 5 [IU] via SUBCUTANEOUS
  Administered 2021-01-16: 2 [IU] via SUBCUTANEOUS
  Administered 2021-01-17: 3 [IU] via SUBCUTANEOUS
  Administered 2021-01-17: 5 [IU] via SUBCUTANEOUS
  Administered 2021-01-17: 8 [IU] via SUBCUTANEOUS
  Administered 2021-01-18 (×2): 3 [IU] via SUBCUTANEOUS
  Administered 2021-01-19: 2 [IU] via SUBCUTANEOUS
  Filled 2021-01-03 (×42): qty 1

## 2021-01-03 MED ORDER — APIXABAN 5 MG PO TABS
5.0000 mg | ORAL_TABLET | Freq: Two times a day (BID) | ORAL | Status: DC
  Administered 2021-01-03 – 2021-01-08 (×11): 5 mg via ORAL
  Filled 2021-01-03 (×12): qty 1

## 2021-01-03 NOTE — Consult Note (Signed)
CODE SEPSIS - PHARMACY COMMUNICATION  **Broad Spectrum Antibiotics should be administered within 1 hour of Sepsis diagnosis**  Time Code Sepsis Called/Page Received: 0906 @ 1847  Antibiotics Ordered: Cefepime 2g IV x 1, Vancomycin '2500mg'$  IV x 1 ('2000mg'$  immediately followed by '500mg'$ )  Time of 1st antibiotic administration: 0906 @ [place], PharmD Pharmacy Resident  01/03/2021 7:16 PM

## 2021-01-03 NOTE — Consult Note (Signed)
Pharmacy Antibiotic Note  [name]. is a 65 y.o. male admitted on 01/03/2021 with  L foot osteo .    Pharmacy has been consulted for Vancomycin/Zosyn dosing.   Plan: Pt to received 2500 mg loading dose vancomycin in the ED,  Will follow with Vancomycin 1750 mg IV Q 12 hrs.  Goal AUC 400-550. Expected AUC: 496 SCr used: 0.85  Will start Zosyn 3.375g q8 hr (4 hour infusion)  Height: '6\' 4"'$  (193 cm) Weight: (!) 152 kg (335 lb) IBW/kg (Calculated) : 86.8  Temp (24hrs), Avg:102 F (38.9 C), Min:99.9 F (37.7 C), Max:104.3 F (40.2 C)  Recent Labs  Lab 01/03/21 1718 01/03/21 1726 01/03/21 1927  WBC  --  11.4*  --   CREATININE  --  0.85  --   LATICACIDVEN 1.7  --  1.1    Estimated Creatinine Clearance: 138.4 mL/min (by C-G formula based on SCr of 0.85 mg/dL).    Allergies  Allergen Reactions   Statins     Other reaction(s): Muscle Pain Causes legs to ache per pt    Antimicrobials this admission: 9/6 Cefepime 2g IV x 1  9/6 Vanc >>  9/6 Zosyn >>  Dose adjustments this admission: None  Microbiology results: 9/6 BCx: pending 9/6 Resp Panel pending COVID/FLU NEG  Thank you for allowing pharmacy to be a part of this patient's care.  Lu Duffel, PharmD, BCPS Clinical Pharmacist 01/03/2021 9:51 PM

## 2021-01-03 NOTE — ED Notes (Signed)
Legs unwrapped per Dr Ellender Hose

## 2021-01-03 NOTE — ED Notes (Signed)
Delay in vitals due to pt needing to void

## 2021-01-03 NOTE — ED Notes (Signed)
Pt attempted to void in urinal , got it  On floor

## 2021-01-03 NOTE — H&P (Signed)
History and Physical    [name]. [id number] DOB: May 07, 1955 DOA: 01/03/2021  PCP: [name], MD  Patient coming from: Home  I have personally briefly reviewed patient's old medical records in [facility]  Chief Complaint: fever  HPI: [name]. is a 65 y.o. male with medical history significant of hypertension, heart failure, hyperlipidemia, diabetes presents with fever and chills.  ED provider discussed patient with his wife patient was outside confused initially had some dyspnea but sats within normal limits.  Patient's COVID and flu are negative.  Patient has chronic bilateral wounds no increase in drainage per patient.  Patient endorses some feeling of cold a week.  Documented fever of 104, repeat 101 heart rate 1 16-1 20s but IV fluids held due to heart failure.  Blood pressures 1 15-1 60s sats within normal limits on room air.  CMP within normal limits except glucose 269 anion gap 9 calcium 8.8 albumin 3.4.  Troponins 11 and 22.  CBC with white count of 11.4 H&H 14 and 43 and platelets 150.  UA positive for protein but no nitrites or leuks.  Status post blood cultures in the ED.  Left foot x-ray showing chronic bone erosion involving the lateral aspect of the head of the fifth metatarsal compatible with osteounchanged since April 31st of this year but new compared to 02/25/2020.  There is also soft tissue swelling status post third digit amputation.  Chest x-ray showing pulmonary vascular congestion.  EKG sinus tach with right bundle branch block.  Patient given Flagyl, vancomycin, Tylenol and cefepime.  No headache or neck pain.  Patient endorsing confusion earlier today to ED provider.  ED Course: Treatment as outlined above  Review of Systems: As per HPI otherwise all other systems reviewed and are negative.  Past Medical History:  Diagnosis Date   Asthma    Coronary artery disease    Depression    Diabetes mellitus without complication ([place])     Gout    History anabolic steroid use    Hyperlipidemia    Hypertension    Hypogonadism in male    Morbid obesity ([place])    Myocardial infarction ([place])    Peripheral vascular disease ([place])    Perirectal abscess    Pleurisy    Sleep apnea    CPAP at night, no oxygen   Varicella     Past Surgical History:  Procedure Laterality Date   ABDOMINAL AORTIC ANEURYSM REPAIR     AMPUTATION TOE Right 02/10/2016   Procedure: AMPUTATION TOE 3RD TOE;  Surgeon: Samara Deist, DPM;  Location: [facility];  Service: Podiatry;  Laterality: Right;   AMPUTATION TOE Left 02/24/2020   Procedure: AMPUTATION TOE;  Surgeon: Caroline More, DPM;  Location: [facility];  Service: Podiatry;  Laterality: Left;   APPLICATION OF WOUND VAC Left 02/29/2020   Procedure: APPLICATION OF WOUND VAC;  Surgeon: Caroline More, DPM;  Location: [facility];  Service: Podiatry;  Laterality: Left;   COLONOSCOPY WITH PROPOFOL [name] 11/18/2015   Procedure: COLONOSCOPY WITH PROPOFOL;  Surgeon: Manya Silvas, MD;  Location: [facility];  Service: Endoscopy;  Laterality: [name];   CORONARY ARTERY BYPASS GRAFT     CORONARY STENT INTERVENTION [name] 02/02/2020   Procedure: CORONARY STENT INTERVENTION;  Surgeon: Isaias Cowman, MD;  Location: [facility];  Service: Cardiovascular;  Laterality: [name];   IRRIGATION AND DEBRIDEMENT FOOT Left 02/29/2020   Procedure: IRRIGATION AND DEBRIDEMENT FOOT;  Surgeon: Caroline More, DPM;  Location:  [facility];  Service: Podiatry;  Laterality: Left;   IRRIGATION AND DEBRIDEMENT FOOT Left 02/24/2020   Procedure: IRRIGATION AND DEBRIDEMENT FOOT;  Surgeon: Caroline More, DPM;  Location: [facility];  Service: Podiatry;  Laterality: Left;   KNEE ARTHROSCOPY     LEFT HEART CATH AND CORS/GRAFTS ANGIOGRAPHY [name] 02/02/2020   Procedure: LEFT HEART CATH AND CORS/GRAFTS ANGIOGRAPHY;  Surgeon: Teodoro Spray, MD;  Location: [facility];  Service: Cardiovascular;  Laterality: [name];   LOWER EXTREMITY  ANGIOGRAPHY Left 02/25/2020   Procedure: Lower Extremity Angiography;  Surgeon: Algernon Huxley, MD;  Location: [facility];  Service: Cardiovascular;  Laterality: Left;   PERIPHERAL VASCULAR CATHETERIZATION Right 01/24/2016   Procedure: Lower Extremity Angiography;  Surgeon: Katha Cabal, MD;  Location: [facility];  Service: Cardiovascular;  Laterality: Right;   PERIPHERAL VASCULAR CATHETERIZATION Right 01/25/2016   Procedure: Lower Extremity Angiography;  Surgeon: Katha Cabal, MD;  Location: [facility];  Service: Cardiovascular;  Laterality: Right;   TOE AMPUTATION     TONSILLECTOMY      Social History  reports that he quit smoking about 5 years ago. His smoking use included cigarettes. He has a 22.50 pack-year smoking history. He has never used smokeless tobacco. He reports that he does not currently use alcohol after a past usage of about 3.0 standard drinks per week. He reports that he does not use drugs.  Allergies  Allergen Reactions   Statins     Other reaction(s): Muscle Pain Causes legs to ache per pt    History reviewed. No pertinent family history.   Prior to Admission medications   Medication Sig Start Date End Date Taking? Authorizing Provider  apixaban (ELIQUIS) 5 MG TABS tablet Take 1 tablet (5 mg total) by mouth 2 (two) times daily. 01/26/16   Stegmayer, Janalyn Harder, PA-C  aspirin EC 81 MG tablet Take 1 tablet (81 mg total) by mouth daily. Swallow whole. 02/03/20 02/02/21  Teodoro Spray, MD  atorvastatin (LIPITOR) 40 MG tablet Take 1 tablet (40 mg total) by mouth daily. 02/03/20   Teodoro Spray, MD  clopidogrel (PLAVIX) 75 MG tablet Take 1 tablet (75 mg total) by mouth daily with breakfast. 02/04/20   Teodoro Spray, MD  cyclobenzaprine (FLEXERIL) 5 MG tablet Take 1 tablet (5 mg total) by mouth 3 (three) times daily as needed for muscle spasms. 10/10/20   Blake Divine, MD  ferrous sulfate 325 (65 FE) MG tablet Take 325 mg by mouth  daily with breakfast.    [provider]  furosemide (LASIX) 20 MG tablet Take 20 mg by mouth daily as needed for fluid or edema.     [provider]  insulin aspart (NOVOLOG) 100 UNIT/ML injection Inject 10 Units into the skin 3 (three) times daily with meals. Patient taking differently: Inject 18 Units into the skin 3 (three) times daily with meals. 03/03/20   Fritzi Mandes, MD  isosorbide mononitrate (IMDUR) 60 MG 24 hr tablet Take 60 mg by mouth 2 (two) times daily. 02/22/20   [provider]  Lactobacillus (ACIDOPHILUS) CAPS capsule Take 1 capsule by mouth daily.    [provider]  LANTUS SOLOSTAR 100 UNIT/ML Solostar Pen Inject 40 Units into the skin 2 (two) times daily. Patient taking differently: Inject 25 Units into the skin 2 (two) times daily. Pt only takes 25 units at bedtime 03/03/20   Fritzi Mandes, MD  losartan (COZAAR) 100 MG tablet Take 100  mg by mouth daily.    [provider]  MEDIUM CHAIN TRIGLYCERIDES PO Take 1,000 mg by mouth daily.    [provider]  metFORMIN (GLUCOPHAGE) 1000 MG tablet Take 1,000 mg by mouth 2 (two) times daily.  12/02/17   [provider]  metoprolol (LOPRESSOR) 50 MG tablet Take 50 mg by mouth 2 (two) times daily.    [provider]  Multiple Vitamins-Minerals (MULTIVITAMIN WITH MINERALS) tablet Take 1 tablet by mouth daily.    [provider]  nitroGLYCERIN (NITROSTAT) 0.4 MG SL tablet Place 0.4 mg under the tongue daily as needed. 01/25/20   [provider]  oxyCODONE-acetaminophen (PERCOCET) 5-325 MG tablet Take 1 tablet by mouth every 6 (six) hours as needed for severe pain. 10/10/20 10/10/21  Blake Divine, MD  OZEMPIC, 0.25 OR 0.5 MG/DOSE, 2 MG/1.5ML SOPN Inject into the skin. 06/18/20   [provider]  pramipexole (MIRAPEX) 1 MG tablet Take 1 mg by mouth at bedtime. 08/24/19   [provider]  primidone (MYSOLINE) 250 MG tablet Take 250 mg by mouth 2  (two) times daily. 01/23/20   [provider]  tamsulosin (FLOMAX) 0.4 MG CAPS capsule Take 0.4 mg by mouth daily.    [provider]  vitamin B-12 (CYANOCOBALAMIN) 1000 MCG tablet Take 10,000 mcg by mouth daily. Energy shot    [provider]  zinc gluconate 50 MG tablet Take 50 mg by mouth daily.    [provider]    Physical Exam: Vitals:   01/03/21 1926 01/03/21 1930 01/03/21 2000 01/03/21 2030  BP:  (!) 141/64 (!) 145/65 115/64  Pulse:  (!) 119 (!) 122 (!) 116  Resp:  (!) 23 (!) 28 (!) 27  Temp: (!) 101.7 F (38.7 C)     TempSrc: Oral     SpO2:  95% 93% 96%  Weight:      Height:        Constitutional: NAD, calm, comfortable Vitals:   01/03/21 1926 01/03/21 1930 01/03/21 2000 01/03/21 2030  BP:  (!) 141/64 (!) 145/65 115/64  Pulse:  (!) 119 (!) 122 (!) 116  Resp:  (!) 23 (!) 28 (!) 27  Temp: (!) 101.7 F (38.7 C)     TempSrc: Oral     SpO2:  95% 93% 96%  Weight:      Height:       Eyes: PERRL, lids and conjunctivae normal ENMT: Dry membranes are moist. Posterior pharynx clear of any exudate or lesions.Normal dentition.  Neck: normal, supple, no masses, no thyromegaly Respiratory: clear to auscultation bilaterally, no wheezing, no crackles. Normal respiratory effort. No accessory muscle use.  Cardiovascular: Tachycardic no murmurs / rubs / gallops.  Abdomen: no tenderness, no masses palpated. Obese abdomen.  Bowel sounds positive.  Musculoskeletal: no clubbing / cyanosis. no contractures. Normal muscle tone.  Skin: Venous stasis changes bilateral lower extremities erythema and warmth top/dorsal area of foot down to ulceration inferior/plantar area 1.5x1.5cm Neurologic: CN 2-12 grossly intact. Strength 3/5 in all 4.  Psychiatric: Normal judgment and insight. Alert and oriented x 3. Normal mood.    Labs on Admission: I have personally reviewed following labs and imaging studies  CBC: Recent Labs  Lab 01/03/21 1726  WBC 11.4*   NEUTROABS 10.5*  HGB 14.6  HCT 43.7  MCV 91.2  PLT [id number]    Basic Metabolic Panel: Recent Labs  Lab 01/03/21 1726  NA 135  K 4.4  CL 99  CO2 27  GLUCOSE  269*  BUN 21  CREATININE 0.85  CALCIUM 8.8*    GFR: Estimated Creatinine Clearance: 138.4 mL/min (by C-G formula based on SCr of 0.85 mg/dL).  Liver Function Tests: Recent Labs  Lab 01/03/21 1726  AST 18  ALT 16  ALKPHOS 103  BILITOT 0.6  PROT 7.3  ALBUMIN 3.4*    Urine analysis:    Component Value Date/Time   COLORURINE STRAW (A) 01/03/2021 1726   APPEARANCEUR CLEAR 01/03/2021 1726   LABSPEC 1.025 01/03/2021 1726   PHURINE 7.0 01/03/2021 1726   GLUCOSEU 500 (A) 01/03/2021 1726   HGBUR MODERATE (A) 01/03/2021 1726   BILIRUBINUR NEGATIVE 01/03/2021 1726   KETONESUR 40 (A) 01/03/2021 1726   PROTEINUR >300 (A) 01/03/2021 1726   NITRITE NEGATIVE 01/03/2021 1726   LEUKOCYTESUR NEGATIVE 01/03/2021 1726    Radiological Exams on Admission: DG Chest Port 1 View  Result Date: 01/03/2021 CLINICAL DATA:  Sepsis.  Evaluate for infiltrate. EXAM: PORTABLE CHEST 1 VIEW COMPARISON:  01/06/2020 FINDINGS: Previous median sternotomy and CABG procedure. Stable cardiomediastinal contours. Pulmonary vascular congestion. No pleural effusion or edema. No airspace densities. IMPRESSION: Pulmonary vascular congestion. Electronically Signed   By: Kerby Moors M.D.   On: 01/03/2021 18:02   DG Foot Complete Left  Result Date: 01/03/2021 CLINICAL DATA:  Evaluate for osteomyelitis. EXAM: LEFT FOOT - COMPLETE 3+ VIEW COMPARISON:  12/28/2020 FINDINGS: Status post third ray amputation at the level of the head of the third metatarsal bone. Diffuse soft tissue swelling is identified. There is a focal bone erosion involving the lateral aspect of the head of the fifth metatarsal bone. This is a new finding compared with 02/25/2020, but appears similar to 12/28/20. No additional focal bone erosions. No fracture or dislocation. No radio-opaque  foreign bodies. IMPRESSION: 1. Chronic bone erosion involving the lateral aspect of the head of the fifth metatarsal bone compatible with osteomyelitis. Unchanged from 12/28/2020 but new compared with 02/25/2020. 2. Diffuse soft tissue swelling. 3. Status post third ray amputation. Electronically Signed   By: Kerby Moors M.D.   On: 01/03/2021 19:51    EKG: Independently reviewed.   Assessment/Plan Principal Problem:   Sepsis ([place]) Active Problems:   Diabetes mellitus type 2 in obese (HCC)   CAD (coronary artery disease)   CAD (coronary artery disease), autologous vein bypass graft   Pure hypercholesterolemia   RLS (restless legs syndrome)   Chronic osteomyelitis of left foot ([place]) 65 year old male with history of diabetes presents essentially septic for concerning for worsening osteo. Left foot osteo-- we will cover with Vanco and Zosyn Follow-up blood cultures Gentle IV fluids Will consult patient's podiatrist Dr. Caroline More.  Defer further imaging to Ortho if they desire CT or MRI  CAD-continue Plavix, isosorbide  Diabetes type 2-follow blood sugar checks and A1c and give sliding scale insulin  CHF-we will get echo and give gentle IV fluids, continue losartan  BPH-continue Flomax  Restless legs-continue pramipexole  Hyperlipidemia-continue statin  Disposition-admit patient, telemetry, continue to monitor  DVT prophylaxis: Home apixaban from PVD prior stents Code Status:   Full Family Communication:  Wife Ms. Pesqueira at bedside Disposition Plan:   Patient is from:  Home  Anticipated DC to:  Home  Anticipated DC date:  To be determined  Anticipated DC barriers: To be determined  Consults called:  Podiatry Dr. Caroline More Admission status:  Telemetry stepdown  Severity of Illness: The appropriate patient status for this patient is INPATIENT. Inpatient status is judged to be reasonable and necessary in  order to provide the required intensity of service to ensure the  patient's safety. The patient's presenting symptoms, physical exam findings, and initial radiographic and laboratory data in the context of their chronic comorbidities is felt to place them at high risk for further clinical deterioration. Furthermore, it is not anticipated that the patient will be medically stable for discharge from the hospital within 2 midnights of admission. The following factors support the patient status of inpatient.   " The patient's presenting symptoms include as above. " The worrisome physical exam findings include as above. " The initial radiographic and laboratory data are worrisome because of as above. " The chronic co-morbidities include as above.   * I certify that at the point of admission it is my clinical judgment that the patient will require inpatient hospital care spanning beyond 2 midnights from the point of admission due to high intensity of service, high risk for further deterioration and high frequency of surveillance required.Jacqlyn Krauss MD [facility] [id number] How to contact the [facility] Attending or Consulting provider [place] or covering provider during after hours [place], for this patient?   Check the care team in [facility] and look for a) attending/consulting [facility] provider listed and b) the [facility] team listed Log into [email or web address] and use [place]'s universal password to access. If you do not have the password, please contact the hospital operator. Locate the [facility] provider you are looking for under [facility] and page to a number that you can be directly reached. If you still have difficulty reaching the provider, please page the [facility] (Director on Call) for the Hospitalists listed on amion for assistance.  01/03/2021, 9:11 PM

## 2021-01-03 NOTE — ED Notes (Addendum)
fsbs 291 after dinner meal

## 2021-01-03 NOTE — Consult Note (Addendum)
PHARMACY -  BRIEF ANTIBIOTIC NOTE   Pharmacy has received consult(s) for Vancomycin/Cefepime from an ED provider.  The patient's profile has been reviewed for ht/wt/allergies/indication/available labs.    One time order(s) placed for Cefepime 2g IV x 1, Vancomycin '2500mg'$  IV x 1 ('2000mg'$  immediately followed by '500mg'$ )  Further antibiotics/pharmacy consults should be ordered by admitting physician if indicated.                       Thank you,  Lu Duffel, PharmD, BCPS Clinical Pharmacist 01/03/2021 6:47 PM

## 2021-01-03 NOTE — Sepsis Progress Note (Signed)
Elink following Code Sepsis. 

## 2021-01-03 NOTE — ED Notes (Signed)
Pt eating dinner meal  ?

## 2021-01-03 NOTE — ED Provider Notes (Signed)
[facility] Emergency Department Provider Note  ____________________________________________   Event Date/Time   First MD Initiated Contact with Patient 01/03/21 1710     (approximate)  I have reviewed the triage vital signs and the nursing notes.   HISTORY  Chief Complaint Near Syncope    HPI Alec Reeves. is a 65 y.o. male   with extensive past medical history as below including diabetes, hypertension, hyperlipidemia, CHF, here with fever, confusion.  The patient reportedly had been overall in his usual state of health until today.  He began stating that he felt very cold and weak throughout the morning.  He then was found outside shivering.  He was confused.  He had some mild increased work of breathing.  He was subsequent brought in to the ED for evaluation.  They do not recall any recent illnesses or sick contacts.  He has had COVID previously but denies any recent sick contacts.  Of note, he has chronic wounds of his bilateral legs that wound care has been taking care of at home.  They do not recall any worsening of these wounds.  On my assessment, the patient states he feels weak and cold, but otherwise without complaints.  Denies any abdominal pain, nausea, vomiting, or diarrhea.       Past Medical History:  Diagnosis Date   Asthma    Coronary artery disease    Depression    Diabetes mellitus without complication ([place])    Gout    History anabolic steroid use    Hyperlipidemia    Hypertension    Hypogonadism in male    Morbid obesity ([place])    Myocardial infarction [facility])    Peripheral vascular disease ([place])    Perirectal abscess    Pleurisy    Sleep apnea    CPAP at night, no oxygen   Varicella     Patient Active Problem List   Diagnosis Date Noted   Sepsis ([place]) 01/03/2021   Chronic osteomyelitis of left foot ([place]) 01/03/2021   Osteomyelitis ([place]) 01/03/2021   Severe sepsis ([place]) 02/24/2020   AKI (acute kidney injury) ([place])  02/24/2020   Cellulitis of left lower extremity 02/23/2020   CAD (coronary artery disease) 02/02/2020   RLS (restless legs syndrome) 05/15/2019   Statin myopathy 05/15/2019   Diabetes mellitus type 2 in obese ([place]) 02/13/2016   Essential hypertension, benign 02/13/2016   PAD (peripheral artery disease) ([place]) 02/13/2016   Pressure injury of skin 01/25/2016   Hypoglycemia associated with diabetes ([place]) 12/27/2013   Long-term insulin use ([place]) 12/27/2013   Microalbuminuria 12/27/2013   B12 deficiency 09/29/2013   Obesity 09/29/2013   CAD (coronary artery disease), autologous vein bypass graft 07/20/2013   Hypersomnia with sleep apnea 07/20/2013   Pure hypercholesterolemia 07/20/2013   Osteomyelitis of ankle or foot 07/20/2011    Past Surgical History:  Procedure Laterality Date   ABDOMINAL AORTIC ANEURYSM REPAIR     AMPUTATION TOE Right 02/10/2016   Procedure: AMPUTATION TOE 3RD TOE;  Surgeon: Samara Deist, DPM;  Location: [facility] ORS;  Service: Podiatry;  Laterality: Right;   AMPUTATION TOE Left 02/24/2020   Procedure: AMPUTATION TOE;  Surgeon: Caroline More, DPM;  Location: [facility] ORS;  Service: Podiatry;  Laterality: Left;   APPLICATION OF WOUND VAC Left 02/29/2020   Procedure: APPLICATION OF WOUND VAC;  Surgeon: Caroline More, DPM;  Location: [facility] ORS;  Service: Podiatry;  Laterality: Left;   COLONOSCOPY WITH PROPOFOL [name] 11/18/2015   Procedure: COLONOSCOPY WITH  PROPOFOL;  Surgeon: Manya Silvas, MD;  Location: [facility] ENDOSCOPY;  Service: Endoscopy;  Laterality: [name];   CORONARY ARTERY BYPASS GRAFT     CORONARY STENT INTERVENTION [name] 02/02/2020   Procedure: CORONARY STENT INTERVENTION;  Surgeon: Isaias Cowman, MD;  Location: [place] CV LAB;  Service: Cardiovascular;  Laterality: [name];   IRRIGATION AND DEBRIDEMENT FOOT Left 02/29/2020   Procedure: IRRIGATION AND DEBRIDEMENT FOOT;  Surgeon: Caroline More, DPM;  Location: [facility] ORS;  Service: Podiatry;  Laterality: Left;    IRRIGATION AND DEBRIDEMENT FOOT Left 02/24/2020   Procedure: IRRIGATION AND DEBRIDEMENT FOOT;  Surgeon: Caroline More, DPM;  Location: [facility] ORS;  Service: Podiatry;  Laterality: Left;   KNEE ARTHROSCOPY     LEFT HEART CATH AND CORS/GRAFTS ANGIOGRAPHY [name] 02/02/2020   Procedure: LEFT HEART CATH AND CORS/GRAFTS ANGIOGRAPHY;  Surgeon: Teodoro Spray, MD;  Location: [place] CV LAB;  Service: Cardiovascular;  Laterality: [name];   LOWER EXTREMITY ANGIOGRAPHY Left 02/25/2020   Procedure: Lower Extremity Angiography;  Surgeon: Algernon Huxley, MD;  Location: [place] CV LAB;  Service: Cardiovascular;  Laterality: Left;   PERIPHERAL VASCULAR CATHETERIZATION Right 01/24/2016   Procedure: Lower Extremity Angiography;  Surgeon: Katha Cabal, MD;  Location: [place] CV LAB;  Service: Cardiovascular;  Laterality: Right;   PERIPHERAL VASCULAR CATHETERIZATION Right 01/25/2016   Procedure: Lower Extremity Angiography;  Surgeon: Katha Cabal, MD;  Location: [place] CV LAB;  Service: Cardiovascular;  Laterality: Right;   TOE AMPUTATION     TONSILLECTOMY      Prior to Admission medications   Medication Sig Start Date End Date Taking? Authorizing Provider  apixaban (ELIQUIS) 5 MG TABS tablet Take 1 tablet (5 mg total) by mouth 2 (two) times daily. 01/26/16   Stegmayer, Janalyn Harder, PA-C  aspirin EC 81 MG tablet Take 1 tablet (81 mg total) by mouth daily. Swallow whole. 02/03/20 02/02/21  Teodoro Spray, MD  atorvastatin (LIPITOR) 40 MG tablet Take 1 tablet (40 mg total) by mouth daily. 02/03/20   Teodoro Spray, MD  clopidogrel (PLAVIX) 75 MG tablet Take 1 tablet (75 mg total) by mouth daily with breakfast. 02/04/20   Teodoro Spray, MD  cyclobenzaprine (FLEXERIL) 5 MG tablet Take 1 tablet (5 mg total) by mouth 3 (three) times daily as needed for muscle spasms. 10/10/20   Blake Divine, MD  ferrous sulfate 325 (65 FE) MG tablet Take 325 mg by mouth daily with breakfast.    [provider]  furosemide (LASIX) 20 MG tablet Take 20 mg by mouth daily as needed for fluid or edema.     [provider]  insulin aspart (NOVOLOG) 100 UNIT/ML injection Inject 10 Units into the skin 3 (three) times daily with meals. Patient taking differently: Inject 18 Units into the skin 3 (three) times daily with meals. 03/03/20   Fritzi Mandes, MD  isosorbide mononitrate (IMDUR) 60 MG 24 hr tablet Take 60 mg by mouth 2 (two) times daily. 02/22/20   [provider]  Lactobacillus (ACIDOPHILUS) CAPS capsule Take 1 capsule by mouth daily.    [provider]  LANTUS SOLOSTAR 100 UNIT/ML Solostar Pen Inject 40 Units into the skin 2 (two) times daily. Patient taking differently: Inject 25 Units into the skin 2 (two) times daily. Pt only takes 25 units at bedtime 03/03/20   Fritzi Mandes, MD  losartan (COZAAR) 100 MG tablet Take 100 mg by mouth daily.    [provider]  MEDIUM CHAIN TRIGLYCERIDES  PO Take 1,000 mg by mouth daily.    [provider]  metFORMIN (GLUCOPHAGE) 1000 MG tablet Take 1,000 mg by mouth 2 (two) times daily.  12/02/17   [provider]  metoprolol (LOPRESSOR) 50 MG tablet Take 50 mg by mouth 2 (two) times daily.    [provider]  Multiple Vitamins-Minerals (MULTIVITAMIN WITH MINERALS) tablet Take 1 tablet by mouth daily.    [provider]  nitroGLYCERIN (NITROSTAT) 0.4 MG SL tablet Place 0.4 mg under the tongue daily as needed. 01/25/20   [provider]  oxyCODONE-acetaminophen (PERCOCET) 5-325 MG tablet Take 1 tablet by mouth every 6 (six) hours as needed for severe pain. 10/10/20 10/10/21  Blake Divine, MD  OZEMPIC, 0.25 OR 0.5 MG/DOSE, 2 MG/1.5ML SOPN Inject into the skin. 06/18/20   [provider]  pramipexole (MIRAPEX) 1 MG tablet Take 1 mg by mouth at bedtime. 08/24/19   [provider]  primidone (MYSOLINE) 250 MG tablet Take 250 mg by mouth 2 (two) times daily. 01/23/20    [provider]  tamsulosin (FLOMAX) 0.4 MG CAPS capsule Take 0.4 mg by mouth daily.    [provider]  vitamin B-12 (CYANOCOBALAMIN) 1000 MCG tablet Take 10,000 mcg by mouth daily. Energy shot    [provider]  zinc gluconate 50 MG tablet Take 50 mg by mouth daily.    [provider]    Allergies Statins  History reviewed. No pertinent family history.  Social History Social History   Tobacco Use   Smoking status: Former    Packs/day: 0.50    Years: 45.00    Pack years: 22.50    Types: Cigarettes    Quit date: 04/05/2015    Years since quitting: 5.7   Smokeless tobacco: Never  Vaping Use   Vaping Use: Every day  Substance Use Topics   Alcohol use: Not Currently    Alcohol/week: 3.0 standard drinks    Types: 3 Glasses of wine per week   Drug use: No    Review of Systems  Review of Systems  Constitutional:  Positive for chills, fatigue and fever.  HENT:  Negative for sore throat.   Respiratory:  Negative for shortness of breath.   Cardiovascular:  Negative for chest pain.  Gastrointestinal:  Negative for abdominal pain.  Genitourinary:  Negative for flank pain.  Musculoskeletal:  Negative for neck pain.  Skin:  Negative for rash and wound.  Allergic/Immunologic: Negative for immunocompromised state.  Neurological:  Positive for weakness. Negative for numbness.  Hematological:  Does not bruise/bleed easily.  Psychiatric/Behavioral:  Positive for confusion.   All other systems reviewed and are negative.   ____________________________________________  PHYSICAL EXAM:      VITAL SIGNS: ED Triage Vitals  Enc Vitals Group     BP 01/03/21 1724 (!) 162/74     Pulse Rate 01/03/21 1714 (!) 119     Resp 01/03/21 1714 18     Temp 01/03/21 1714 99.9 F (37.7 C)     Temp Source 01/03/21 1714 Oral     SpO2 01/03/21 1714 94 %     Weight 01/03/21 1711 (!) 335 lb (152 kg)     Height 01/03/21 1711 '6\' 4"'$  (1.93 m)     Head Circumference --       Peak Flow --      Pain Score 01/03/21 1711 3     Pain Loc --      Pain Edu? --  Excl. in [place]? --      Physical Exam Vitals and nursing note reviewed.  Constitutional:      General: He is not in acute distress.    Appearance: He is well-developed.  HENT:     Head: Normocephalic and atraumatic.     Mouth/Throat:     Mouth: Mucous membranes are dry.  Eyes:     Conjunctiva/sclera: Conjunctivae normal.  Cardiovascular:     Rate and Rhythm: Regular rhythm. Tachycardia present.     Heart sounds: Normal heart sounds. No murmur heard.   No friction rub.  Pulmonary:     Effort: Pulmonary effort is normal. No respiratory distress.     Breath sounds: Normal breath sounds. No wheezing or rales.  Abdominal:     General: There is no distension.     Palpations: Abdomen is soft.     Tenderness: There is no abdominal tenderness.  Musculoskeletal:     Cervical back: Neck supple.  Skin:    General: Skin is warm.     Capillary Refill: Capillary refill takes less than 2 seconds.     Findings: Rash (Chronic venous stasis bilateral lower extremities, moderate erythema and warmth over the left dorsal foot extending to a known ulceration over the lateral foot.) present.  Neurological:     Mental Status: He is alert and oriented to person, place, and time.     Motor: No abnormal muscle tone.      ____________________________________________   LABS (all labs ordered are listed, but only abnormal results are displayed)  Labs Reviewed  COMPREHENSIVE METABOLIC PANEL - Abnormal; Notable for the following components:      Result Value   Glucose, Bld 269 (*)    Calcium 8.8 (*)    Albumin 3.4 (*)    All other components within normal limits  CBC WITH DIFFERENTIAL/PLATELET - Abnormal; Notable for the following components:   WBC 11.4 (*)    Neutro Abs 10.5 (*)    Lymphs Abs 0.5 (*)    All other components within normal limits  URINALYSIS, COMPLETE (UACMP) WITH MICROSCOPIC - Abnormal;  Notable for the following components:   Color, Urine STRAW (*)    Glucose, UA 500 (*)    Hgb urine dipstick MODERATE (*)    Ketones, ur 40 (*)    Protein, ur >300 (*)    All other components within normal limits  BRAIN NATRIURETIC PEPTIDE - Abnormal; Notable for the following components:   B Natriuretic Peptide 197.5 (*)    All other components within normal limits  BLOOD GAS, VENOUS - Abnormal; Notable for the following components:   pH, Ven 7.47 (*)    pCO2, Ven 42 (*)    Bicarbonate 30.6 (*)    Acid-Base Excess 6.2 (*)    All other components within normal limits  TROPONIN I (HIGH SENSITIVITY) - Abnormal; Notable for the following components:   Troponin I (High Sensitivity) 22 (*)    All other components within normal limits  RESP PANEL BY RT-PCR (FLU A&B, COVID) ARPGX2  CULTURE, BLOOD (SINGLE)  CULTURE, BLOOD (SINGLE)  RESPIRATORY PANEL BY PCR  LACTIC ACID, PLASMA  LACTIC ACID, PLASMA  PROTIME-INR  APTT  COMPREHENSIVE METABOLIC PANEL  CBC WITH DIFFERENTIAL/PLATELET  HEMOGLOBIN A1C  TROPONIN I (HIGH SENSITIVITY)    ____________________________________________  EKG: Sinus tachycardia, ventricular 120.  PR 154, QRS 133, QTc 460.  Nonspecific ST changes.  No acute ST elevations or depressions. ________________________________________  RADIOLOGY All imaging, including plain films, CT  scans, and ultrasounds, independently reviewed by me, and interpretations confirmed via formal radiology reads.  ED MD interpretation:   CXR: Pulm vascular congestion XR Foot: Current bone erosion involving the lateral aspect of the fifth metatarsal bone  Official radiology report(s): DG Chest Port 1 View  Result Date: 01/03/2021 CLINICAL DATA:  Sepsis.  Evaluate for infiltrate. EXAM: PORTABLE CHEST 1 VIEW COMPARISON:  01/06/2020 FINDINGS: Previous median sternotomy and CABG procedure. Stable cardiomediastinal contours. Pulmonary vascular congestion. No pleural effusion or edema. No airspace  densities. IMPRESSION: Pulmonary vascular congestion. Electronically Signed   By: Kerby Moors M.D.   On: 01/03/2021 18:02   DG Foot Complete Left  Result Date: 01/03/2021 CLINICAL DATA:  Evaluate for osteomyelitis. EXAM: LEFT FOOT - COMPLETE 3+ VIEW COMPARISON:  12/28/2020 FINDINGS: Status post third ray amputation at the level of the head of the third metatarsal bone. Diffuse soft tissue swelling is identified. There is a focal bone erosion involving the lateral aspect of the head of the fifth metatarsal bone. This is a new finding compared with 02/25/2020, but appears similar to 12/28/20. No additional focal bone erosions. No fracture or dislocation. No radio-opaque foreign bodies. IMPRESSION: 1. Chronic bone erosion involving the lateral aspect of the head of the fifth metatarsal bone compatible with osteomyelitis. Unchanged from 12/28/2020 but new compared with 02/25/2020. 2. Diffuse soft tissue swelling. 3. Status post third ray amputation. Electronically Signed   By: Kerby Moors M.D.   On: 01/03/2021 19:51    ____________________________________________  PROCEDURES   Procedure(s) performed (including Critical Care):  .Critical Care  Date/Time: 01/03/2021 10:19 PM Performed by: Duffy Bruce, MD Authorized by: Duffy Bruce, MD   Critical care provider statement:    Critical care time (minutes):  35   Critical care time was exclusive of:  Separately billable procedures and treating other patients and teaching time   Critical care was necessary to treat or prevent imminent or life-threatening deterioration of the following conditions:  Cardiac failure, circulatory failure and respiratory failure   Critical care was time spent personally by me on the following activities:  Development of treatment plan with patient or surrogate, discussions with consultants, evaluation of patient's response to treatment, examination of patient, obtaining history from patient or surrogate, ordering and  performing treatments and interventions, ordering and review of laboratory studies, ordering and review of radiographic studies, pulse oximetry, re-evaluation of patient's condition and review of old charts   I assumed direction of critical care for this patient from another provider in my specialty: no    ____________________________________________  INITIAL IMPRESSION / MDM / [place] / ED COURSE  As part of my medical decision making, I reviewed the following data within the [place] notes reviewed and incorporated, Old chart reviewed, Notes from prior ED visits, and [place] Controlled [place]stance [place]. was evaluated in Emergency Department on 01/03/2021 for the symptoms described in the history of present illness. He was evaluated in the context of the global COVID-19 pandemic, which necessitated consideration that the patient might be at risk for infection with the SARS-CoV-2 virus that causes COVID-19. Institutional protocols and algorithms that pertain to the evaluation of patients at risk for COVID-19 are in a state of rapid change based on information released by regulatory bodies including the CDC and federal and state organizations. These policies and algorithms were followed during the patient's care in the ED.  Some ED evaluations and interventions may  be delayed as a result of limited staffing during the pandemic.*     Medical Decision Making: 65 year old male here with sepsis.  Patient febrile, tachycardic, but nontoxic.  Clinically, patient has possible cellulitis of the left ankle which could explain his fever.  He also had some borderline hypoxia, though he has some mild edema noted on chest x-ray.  Will be very cautious with fluids.  BNP is slightly elevated.  He does appear intravascularly dry, however, with ketonuria.  CBC with mild leukocytosis.  Lactic acid is normal.  Will plan to cover empirically, admit for sepsis,  possibly due to cellulitis of left foot.  No other etiology identified.  He has no headache, neck stiffness, or evidence to suggest meningitis or encephalitis.  ____________________________________________  FINAL CLINICAL IMPRESSION(S) / ED DIAGNOSES  Final diagnoses:  Sepsis due to cellulitis [facility])     MEDICATIONS GIVEN DURING THIS VISIT:  Medications  metroNIDAZOLE (FLAGYL) IVPB 500 mg (500 mg Intravenous New Bag/Given 01/03/21 1915)  0.9 %  sodium chloride infusion ( Intravenous New Bag/Given 01/03/21 1857)  vancomycin (VANCOREADY) IVPB 2000 mg/400 mL (2,000 mg Intravenous New Bag/Given 01/03/21 2037)  vancomycin (VANCOREADY) IVPB 500 mg/100 mL (0 mg Intravenous Hold 01/03/21 2040)  aspirin EC tablet 81 mg (has no administration in time range)  apixaban (ELIQUIS) tablet 5 mg (has no administration in time range)  0.9 %  sodium chloride infusion (has no administration in time range)  acetaminophen (TYLENOL) tablet 650 mg (has no administration in time range)    Or  acetaminophen (TYLENOL) suppository 650 mg (has no administration in time range)  ondansetron (ZOFRAN) tablet 4 mg (has no administration in time range)    Or  ondansetron (ZOFRAN) injection 4 mg (has no administration in time range)  insulin aspart (novoLOG) injection 0-15 Units (has no administration in time range)  insulin aspart (novoLOG) injection 0-5 Units (has no administration in time range)  piperacillin-tazobactam (ZOSYN) IVPB 3.375 g (has no administration in time range)  vancomycin (VANCOREADY) IVPB 1750 mg/350 mL (has no administration in time range)  acetaminophen (TYLENOL) tablet 650 mg (650 mg Oral Given 01/03/21 1843)  ceFEPIme (MAXIPIME) 2 g in sodium chloride 0.9 % 100 mL IVPB (0 g Intravenous Stopped 01/03/21 1925)     ED Discharge Orders     None        Note:  This document was prepared using Dragon voice recognition software and may include unintentional dictation errors.   Duffy Bruce,  MD 01/03/21 2220

## 2021-01-03 NOTE — ED Triage Notes (Addendum)
Pt BIBA from home. Pt was sitting on porch (all day), then became nauseated. Pt had near syncopal event. Pt now c/o headache. Pt was seen on 9/1 at [facility] for extra fluid on his legs

## 2021-01-04 ENCOUNTER — Encounter
Admission: EM | Disposition: A | Discharge status: Skilled Nursing Facility | Payer: Self-pay | Source: Home / Self Care | Attending: Internal Medicine

## 2021-01-04 ENCOUNTER — Inpatient Hospital Stay: Payer: Medicare Other

## 2021-01-04 ENCOUNTER — Other Ambulatory Visit: Payer: Self-pay

## 2021-01-04 ENCOUNTER — Ambulatory Visit: Payer: Medicare Other | Admitting: Internal Medicine

## 2021-01-04 ENCOUNTER — Inpatient Hospital Stay
Admit: 2021-01-04 | Discharge: 2021-01-04 | Disposition: A | Discharge status: Home / Self Care | Payer: Medicare Other | Attending: Family Medicine | Admitting: Family Medicine

## 2021-01-04 DIAGNOSIS — I70248 Atherosclerosis of native arteries of left leg with ulceration of other part of lower left leg: Secondary | ICD-10-CM

## 2021-01-04 DIAGNOSIS — B955 Unspecified streptococcus as the cause of diseases classified elsewhere: Secondary | ICD-10-CM | POA: Diagnosis not present

## 2021-01-04 DIAGNOSIS — E1169 Type 2 diabetes mellitus with other specified complication: Secondary | ICD-10-CM

## 2021-01-04 DIAGNOSIS — L089 Local infection of the skin and subcutaneous tissue, unspecified: Secondary | ICD-10-CM | POA: Diagnosis not present

## 2021-01-04 DIAGNOSIS — R7881 Bacteremia: Secondary | ICD-10-CM | POA: Diagnosis not present

## 2021-01-04 DIAGNOSIS — I2510 Atherosclerotic heart disease of native coronary artery without angina pectoris: Secondary | ICD-10-CM

## 2021-01-04 DIAGNOSIS — L0390 Cellulitis, unspecified: Secondary | ICD-10-CM | POA: Diagnosis not present

## 2021-01-04 DIAGNOSIS — A419 Sepsis, unspecified organism: Secondary | ICD-10-CM | POA: Diagnosis not present

## 2021-01-04 DIAGNOSIS — E669 Obesity, unspecified: Secondary | ICD-10-CM

## 2021-01-04 DIAGNOSIS — E11628 Type 2 diabetes mellitus with other skin complications: Secondary | ICD-10-CM | POA: Diagnosis not present

## 2021-01-04 HISTORY — PX: LOWER EXTREMITY ANGIOGRAPHY: CATH118251

## 2021-01-04 LAB — GLUCOSE, CAPILLARY
Glucose-Capillary: 355 mg/dL — ABNORMAL HIGH (ref 70–99)
Glucose-Capillary: 305 mg/dL — ABNORMAL HIGH (ref 70–99)
Glucose-Capillary: 238 mg/dL — ABNORMAL HIGH (ref 70–99)
Glucose-Capillary: 254 mg/dL — ABNORMAL HIGH (ref 70–99)
Glucose-Capillary: 345 mg/dL — ABNORMAL HIGH (ref 70–99)

## 2021-01-04 LAB — CBC WITH DIFFERENTIAL/PLATELET
Abs Immature Granulocytes: 0.08 10*3/uL — ABNORMAL HIGH (ref 0.00–0.07)
Basophils Absolute: 0.1 10*3/uL (ref 0.0–0.1)
Basophils Relative: 1 %
Eosinophils Absolute: 0 10*3/uL (ref 0.0–0.5)
Eosinophils Relative: 0 %
HCT: 40.7 % (ref 39.0–52.0)
Hemoglobin: 13.4 g/dL (ref 13.0–17.0)
Immature Granulocytes: 1 %
Lymphocytes Relative: 4 %
Lymphs Abs: 0.5 10*3/uL — ABNORMAL LOW (ref 0.7–4.0)
MCH: 30 pg (ref 26.0–34.0)
MCHC: 32.9 g/dL (ref 30.0–36.0)
MCV: 91.1 fL (ref 80.0–100.0)
Monocytes Absolute: 0.2 10*3/uL (ref 0.1–1.0)
Monocytes Relative: 2 %
Neutro Abs: 11.2 10*3/uL — ABNORMAL HIGH (ref 1.7–7.7)
Neutrophils Relative %: 92 %
Platelets: 148 10*3/uL — ABNORMAL LOW (ref 150–400)
RBC: 4.47 MIL/uL (ref 4.22–5.81)
RDW: 14.3 % (ref 11.5–15.5)
WBC: 12 10*3/uL — ABNORMAL HIGH (ref 4.0–10.5)
nRBC: 0 % (ref 0.0–0.2)

## 2021-01-04 LAB — BLOOD CULTURE ID PANEL (REFLEXED) - BCID2

## 2021-01-04 LAB — COMPREHENSIVE METABOLIC PANEL
ALT: 11 U/L (ref 0–44)
AST: 12 U/L — ABNORMAL LOW (ref 15–41)
Albumin: 2.9 g/dL — ABNORMAL LOW (ref 3.5–5.0)
Alkaline Phosphatase: 87 U/L (ref 38–126)
Anion gap: 8 (ref 5–15)
BUN: 21 mg/dL (ref 8–23)
CO2: 26 mmol/L (ref 22–32)
Calcium: 8.5 mg/dL — ABNORMAL LOW (ref 8.9–10.3)
Chloride: 100 mmol/L (ref 98–111)
Creatinine, Ser: 0.98 mg/dL (ref 0.61–1.24)
GFR, Estimated: >60 mL/min (ref >60)
Glucose, Bld: 317 mg/dL — ABNORMAL HIGH (ref 70–99)
Potassium: 3.8 mmol/L (ref 3.5–5.1)
Sodium: 134 mmol/L — ABNORMAL LOW (ref 135–145)
Total Bilirubin: 0.7 mg/dL (ref 0.3–1.2)
Total Protein: 6.5 g/dL (ref 6.5–8.1)

## 2021-01-04 LAB — HEMOGLOBIN A1C
Hgb A1c MFr Bld: 8.9 % — ABNORMAL HIGH (ref 4.8–5.6)
Mean Plasma Glucose: 208.73 mg/dL

## 2021-01-04 LAB — MRSA NEXT GEN BY PCR, NASAL: MRSA by PCR Next Gen: NOT DETECTED

## 2021-01-04 SURGERY — LOWER EXTREMITY ANGIOGRAPHY
Anesthesia: Moderate Sedation | Laterality: Left

## 2021-01-04 MED ORDER — CEFAZOLIN SODIUM-DEXTROSE 2-4 GM/100ML-% IV SOLN
INTRAVENOUS | Status: AC
  Administered 2021-01-04: 2 g via INTRAVENOUS
  Filled 2021-01-04: qty 100

## 2021-01-04 MED ORDER — SODIUM CHLORIDE 0.9 % IV SOLN
2.0000 g | INTRAVENOUS | Status: DC
  Administered 2021-01-04 – 2021-01-05 (×2): 2 g via INTRAVENOUS
  Filled 2021-01-04: qty 20
  Filled 2021-01-04: qty 2
  Filled 2021-01-04 (×2): qty 20

## 2021-01-04 MED ORDER — CEFAZOLIN SODIUM-DEXTROSE 2-4 GM/100ML-% IV SOLN: 2.0000 g | INTRAVENOUS | Status: AC

## 2021-01-04 MED ORDER — VANCOMYCIN HCL 1500 MG/300ML IV SOLN: 1500.0000 mg | Freq: Two times a day (BID) | INTRAVENOUS | Status: DC

## 2021-01-04 MED ORDER — ATORVASTATIN CALCIUM 20 MG PO TABS
20.0000 mg | ORAL_TABLET | Freq: Every day | ORAL | Status: DC
  Administered 2021-01-04 – 2021-01-18 (×13): 20 mg via ORAL
  Filled 2021-01-04 (×15): qty 1

## 2021-01-04 MED ORDER — INSULIN GLARGINE-YFGN 100 UNIT/ML ~~LOC~~ SOLN
15.0000 [IU] | Freq: Two times a day (BID) | SUBCUTANEOUS | Status: DC
  Administered 2021-01-04 – 2021-01-05 (×4): 15 [IU] via SUBCUTANEOUS
  Filled 2021-01-04 (×6): qty 0.15

## 2021-01-04 MED ORDER — HEPARIN SODIUM (PORCINE) 1000 UNIT/ML IJ SOLN
INTRAMUSCULAR | Status: AC
  Filled 2021-01-04: qty 1

## 2021-01-04 MED ORDER — FENTANYL CITRATE (PF) 100 MCG/2ML IJ SOLN
INTRAMUSCULAR | Status: DC | PRN
  Administered 2021-01-04: 50 ug via INTRAVENOUS

## 2021-01-04 MED ORDER — MIDAZOLAM HCL 2 MG/2ML IJ SOLN
INTRAMUSCULAR | Status: DC | PRN
  Administered 2021-01-04: 2 mg via INTRAVENOUS

## 2021-01-04 MED ORDER — EZETIMIBE 10 MG PO TABS
10.0000 mg | ORAL_TABLET | Freq: Every day | ORAL | Status: DC
  Administered 2021-01-04 – 2021-01-18 (×15): 10 mg via ORAL
  Filled 2021-01-04 (×15): qty 1

## 2021-01-04 MED ORDER — IODIXANOL 320 MG/ML IV SOLN
INTRAVENOUS | Status: DC | PRN
  Administered 2021-01-04: 45 mL

## 2021-01-04 MED ORDER — MIDAZOLAM HCL 2 MG/2ML IJ SOLN
INTRAMUSCULAR | Status: AC
  Filled 2021-01-04: qty 2

## 2021-01-04 MED ORDER — CLOPIDOGREL BISULFATE 75 MG PO TABS
75.0000 mg | ORAL_TABLET | Freq: Every day | ORAL | Status: DC
  Administered 2021-01-05 – 2021-01-19 (×14): 75 mg via ORAL
  Filled 2021-01-04 (×17): qty 1

## 2021-01-04 MED ORDER — INSULIN ASPART 100 UNIT/ML IJ SOLN
4.0000 [IU] | Freq: Three times a day (TID) | INTRAMUSCULAR | Status: DC
  Administered 2021-01-05 – 2021-01-08 (×11): 4 [IU] via SUBCUTANEOUS
  Filled 2021-01-04 (×12): qty 1

## 2021-01-04 MED ORDER — PERFLUTREN LIPID MICROSPHERE
1.0000 mL | INTRAVENOUS | Status: AC | PRN
  Administered 2021-01-04: 3 mL via INTRAVENOUS
  Filled 2021-01-04: qty 10

## 2021-01-04 MED ORDER — FENTANYL CITRATE (PF) 100 MCG/2ML IJ SOLN
INTRAMUSCULAR | Status: AC
  Filled 2021-01-04: qty 2

## 2021-01-04 SURGICAL SUPPLY — 9 items
CATH ANGIO 5F PIGTAIL 65CM (CATHETERS) ×2 IMPLANT
COVER PROBE U/S 5X48 (MISCELLANEOUS) ×2 IMPLANT
DEVICE STARCLOSE SE CLOSURE (Vascular Products) ×2 IMPLANT
GLIDEWIRE ADV .035X260CM (WIRE) ×2 IMPLANT
PACK ANGIOGRAPHY (CUSTOM PROCEDURE TRAY) ×2 IMPLANT
SHEATH BRITE TIP 5FRX11 (SHEATH) ×2 IMPLANT
SYR MEDRAD MARK 7 150ML (SYRINGE) ×2 IMPLANT
TUBING CONTRAST HIGH PRESS 48 (TUBING) ×4 IMPLANT
WIRE GUIDERIGHT .035X150 (WIRE) ×2 IMPLANT

## 2021-01-04 NOTE — ED Notes (Signed)
To mri 

## 2021-01-04 NOTE — Progress Notes (Signed)
Inpatient Diabetes Program Recommendations  AACE/ADA: New Consensus Statement on Inpatient Glycemic Control (2015)  Target Ranges:  Prepandial:   less than 140 mg/dL      Peak postprandial:   less than 180 mg/dL (1-2 hours)      Critically ill patients:  140 - 180 mg/dL   Lab Results  Component Value Date   GLUCAP 355 (H) 01/04/2021   HGBA1C 9.7 (H) 02/24/2020    Review of Glycemic Control Results for [name], [name]" (MRN [id number]) as of 01/04/2021 10:52  Ref. Range 01/03/2021 23:01 01/04/2021 08:27  Glucose-Capillary Latest Ref Range: 70 - 99 mg/dL 291 (H) 355 (H)   Diabetes history: DM2 Outpatient Diabetes medications: Lantus 25 units qhs,Novolog 18 units tid meal coverage, Metformin 1 gm bid, Ozempic q week Current orders for Inpatient glycemic control: Semglee 15 units bid, Novolog 4 units tid meal coverage, Novolog correction 0-15 units itd + hs 0-5 units  Inpatient Diabetes Program Recommendations:   Patient admitted for left foot wound. Patient sees Dr. Honor Junes for endocrinology and last office visit was 12/19/20. A1c @ this time was 8.9 and Dr. Honor Junes added Ozempic back to patient's medication. Will most likely need increase in meal coverage based on home dose. Will follow during hospitalization.  Thank you, Nani Gasser. Cynthia Cogle, RN, MSN, CDE  Diabetes Coordinator Inpatient Glycemic Control Team Team Pager [phone] (8am-5pm) 01/04/2021 10:57 AM

## 2021-01-04 NOTE — Consult Note (Addendum)
PODIATRY / FOOT AND ANKLE SURGERY CONSULTATION NOTE  Requesting Physician: Dr. Reesa Chew  Reason for consult: Foot infection, wounds, sepsis   HPI: [name]. is a 65 y.o. male who presented to [facility] yesterday due to slightly altered mental status and general feeling unwell.  Patient has been seen by myself as well as the wound care center.  Patient has had a chronic wound to the left third interspace area.  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  This took a long time to heal but eventually healed up with local wound care, wound grafting, and use of wound VAC.  After that time patient developed an ulceration underneath the left fifth metatarsal phalangeal joint that has been unimproving and worsening despite numerous methods of local wound care including offloading, total contact casting, graft applications and use of collagen's.  Patient was sent to the wound care center for further evaluation and was treated with Profore dressings.  Patient has noticed improvement in swelling in his legs but states that his left fifth metatarsal phalangeal joint ulceration has maintained and stayed about the same.  Patient once again was brought to the emergency room due to concerns for potential sepsis.  Patient back in October had an angiogram also and follows up with [facility] regularly.  Patient noted that he had a fever and has had it for the past day or so.  Patient also felt weak overall.  Patient presents today resting in bed comfortably and states he is feeling better today and the swelling in his legs has improved.  Patient's wife is also at bedside.  Patient has a history of noncompliance and patient's wife also has a history of manipulating the wounds and not following instructions as ordered.  Patient's wife is very involved in patient's wound care.  PMHx:  Past Medical History:   Diagnosis Date   Asthma    Coronary artery disease    Depression    Diabetes mellitus without complication ([place])    Gout    History anabolic steroid use    Hyperlipidemia    Hypertension    Hypogonadism in male    Morbid obesity ([place])    Myocardial infarction ([place])    Peripheral vascular disease ([place])    Perirectal abscess    Pleurisy    Sleep apnea    CPAP at night, no oxygen   Varicella     Surgical Hx:  Past Surgical History:  Procedure Laterality Date   ABDOMINAL AORTIC ANEURYSM REPAIR     AMPUTATION TOE Right 02/10/2016   Procedure: AMPUTATION TOE 3RD TOE;  Surgeon: Samara Deist, DPM;  Location: [facility] ORS;  Service: Podiatry;  Laterality: Right;   AMPUTATION TOE Left 02/24/2020   Procedure: AMPUTATION TOE;  Surgeon: Caroline More, DPM;  Location: [facility];  Service: Podiatry;  Laterality: Left;   APPLICATION OF WOUND VAC Left 02/29/2020   Procedure: APPLICATION OF WOUND VAC;  Surgeon: Caroline More, DPM;  Location: [facility];  Service: Podiatry;  Laterality: Left;   COLONOSCOPY WITH PROPOFOL [name] 11/18/2015   Procedure: COLONOSCOPY WITH PROPOFOL;  Surgeon: Manya Silvas, MD;  Location: [facility];  Service: Endoscopy;  Laterality: [name];   CORONARY ARTERY BYPASS GRAFT     CORONARY STENT INTERVENTION [name] 02/02/2020   Procedure: CORONARY STENT INTERVENTION;  Surgeon: Isaias Cowman, MD;  Location: [place] [facility];  Service: Cardiovascular;  Laterality:  [name];   IRRIGATION AND DEBRIDEMENT FOOT Left 02/29/2020   Procedure: IRRIGATION AND DEBRIDEMENT FOOT;  Surgeon: Caroline More, DPM;  Location: [facility];  Service: Podiatry;  Laterality: Left;   IRRIGATION AND DEBRIDEMENT FOOT Left 02/24/2020   Procedure: IRRIGATION AND DEBRIDEMENT FOOT;  Surgeon: Caroline More, DPM;  Location: [facility];  Service: Podiatry;  Laterality: Left;   KNEE ARTHROSCOPY     LEFT HEART CATH AND CORS/GRAFTS ANGIOGRAPHY [name] 02/02/2020   Procedure: LEFT HEART CATH AND CORS/GRAFTS ANGIOGRAPHY;   Surgeon: Teodoro Spray, MD;  Location: [place] [facility];  Service: Cardiovascular;  Laterality: [name];   LOWER EXTREMITY ANGIOGRAPHY Left 02/25/2020   Procedure: Lower Extremity Angiography;  Surgeon: Algernon Huxley, MD;  Location: [place] [facility];  Service: Cardiovascular;  Laterality: Left;   PERIPHERAL VASCULAR CATHETERIZATION Right 01/24/2016   Procedure: Lower Extremity Angiography;  Surgeon: Katha Cabal, MD;  Location: [place] [facility];  Service: Cardiovascular;  Laterality: Right;   PERIPHERAL VASCULAR CATHETERIZATION Right 01/25/2016   Procedure: Lower Extremity Angiography;  Surgeon: Katha Cabal, MD;  Location: [place] [facility];  Service: Cardiovascular;  Laterality: Right;   TOE AMPUTATION     TONSILLECTOMY      FHx: History reviewed. No pertinent family history.  Social History:  reports that he quit smoking about 5 years ago. His smoking use included cigarettes. He has a 22.50 pack-year smoking history. He has never used smokeless tobacco. He reports that he does not currently use alcohol after a past usage of about 3.0 standard drinks per week. He reports that he does not use drugs.  Allergies:  Allergies  Allergen Reactions   Statins     Other reaction(s): Muscle Pain Causes legs to ache per pt    Medications Prior to Admission  Medication Sig Dispense Refill   apixaban (ELIQUIS) 5 MG TABS tablet Take 1 tablet (5 mg total) by mouth 2 (two) times daily. 60 tablet 5   aspirin EC 81 MG tablet Take 1 tablet (81 mg total) by mouth daily. Swallow whole. 150 tablet 2   atorvastatin (LIPITOR) 40 MG tablet Take 1 tablet (40 mg total) by mouth daily. 30 tablet 0   clopidogrel (PLAVIX) 75 MG tablet Take 1 tablet (75 mg total) by mouth daily with breakfast. 30 tablet 0   cyclobenzaprine (FLEXERIL) 5 MG tablet Take 1 tablet (5 mg total) by mouth 3 (three) times daily as needed for muscle spasms. 12 tablet 0   ezetimibe (ZETIA) 10 MG tablet Take 10 mg by mouth  daily.     ferrous sulfate 325 (65 FE) MG tablet Take 325 mg by mouth daily with breakfast.     furosemide (LASIX) 20 MG tablet Take 20 mg by mouth daily as needed for fluid or edema.  (Patient not taking: Reported on 01/03/2021)     gabapentin (NEURONTIN) 300 MG capsule Take 300 mg by mouth daily.     insulin aspart (NOVOLOG) 100 UNIT/ML injection Inject 10 Units into the skin 3 (three) times daily with meals. (Patient taking differently: Inject 18 Units into the skin 3 (three) times daily with meals.) 10 mL 11   isosorbide mononitrate (IMDUR) 60 MG 24 hr tablet Take 60 mg by mouth 2 (two) times daily.     Lactobacillus (ACIDOPHILUS) CAPS capsule Take 1 capsule by mouth daily.     LANTUS SOLOSTAR 100 UNIT/ML Solostar Pen Inject 40 Units into the skin 2 (two) times daily. (Patient taking differently: Inject 25 Units  into the skin 2 (two) times daily. Pt only takes 25 units at bedtime) 15 mL 11   losartan (COZAAR) 100 MG tablet Take 100 mg by mouth daily.     MEDIUM CHAIN TRIGLYCERIDES PO Take 1,000 mg by mouth daily.     metFORMIN (GLUCOPHAGE) 1000 MG tablet Take 1,000 mg by mouth 2 (two) times daily.      metoprolol (LOPRESSOR) 50 MG tablet Take 50 mg by mouth 2 (two) times daily.     Multiple Vitamins-Minerals (MULTIVITAMIN WITH MINERALS) tablet Take 1 tablet by mouth daily.     nitroGLYCERIN (NITROSTAT) 0.4 MG SL tablet Place 0.4 mg under the tongue daily as needed.     oxyCODONE-acetaminophen (PERCOCET) 5-325 MG tablet Take 1 tablet by mouth every 6 (six) hours as needed for severe pain. 12 tablet 0   OZEMPIC, 0.25 OR 0.5 MG/DOSE, 2 MG/1.5ML SOPN Inject into the skin.     pramipexole (MIRAPEX) 1 MG tablet Take 2 mg by mouth at bedtime.     primidone (MYSOLINE) 250 MG tablet Take 250 mg by mouth 2 (two) times daily.     tamsulosin (FLOMAX) 0.4 MG CAPS capsule Take 0.4 mg by mouth daily.     vitamin B-12 (CYANOCOBALAMIN) 1000 MCG tablet Take 10,000 mcg by mouth daily. Energy shot     zinc  gluconate 50 MG tablet Take 50 mg by mouth daily.      Physical Exam: General: Alert and oriented.  No apparent distress.  Vascular: Difficult to palpate DP and PT pulses bilateral due to swelling present.  Mild to moderate bilateral lower extremity nonpitting edema.  Neuro: Light touch sensation nearly absent to bilateral lower extremities.  Derm: Ulceration subfifth metatarsal phalange joint left foot which measures approximately 0.7 x 0.6 x 0.4 cm and probes close to bone but no obvious bone or capsule exposed at this time.  Mild odor, no associated erythema, mild serous drainage, no purulence.  Some mild erythema over the dorsal foot at the third interspace area where the previous ulceration is now completely healed.  MSK: Partial third ray amputation bilateral.  Limited ankle joint dorsiflexion noted with knee extended which is minimally improved knee flexion bilateral.  Results for orders placed or performed during the hospital encounter of 01/03/21 (from the past 48 hour(s))  Lactic acid, plasma     Status: None   Collection Time: 01/03/21  5:18 PM  Result Value Ref Range   Lactic Acid, Venous 1.7 0.5 - 1.9 mmol/L    Comment: Performed at [facility], [address] [place], [place] [address]  Comprehensive metabolic panel     Status: Abnormal   Collection Time: 01/03/21  5:26 PM  Result Value Ref Range   Sodium 135 135 - 145 mmol/L   Potassium 4.4 3.5 - 5.1 mmol/L   Chloride 99 98 - 111 mmol/L   CO2 27 22 - 32 mmol/L   Glucose, Bld 269 (H) 70 - 99 mg/dL    Comment: Glucose reference range applies only to samples taken after fasting for at least 8 hours.   BUN 21 8 - 23 mg/dL   Creatinine, Ser 0.85 0.61 - 1.24 mg/dL   Calcium 8.8 (L) 8.9 - 10.3 mg/dL   Total Protein 7.3 6.5 - 8.1 g/dL   Albumin 3.4 (L) 3.5 - 5.0 g/dL   AST 18 15 - 41 U/L   ALT 16 0 - 44 U/L   Alkaline Phosphatase 103 38 - 126 U/L   Total  Bilirubin 0.6 0.3 - 1.2 mg/dL   GFR, Estimated >60 >60  mL/min    Comment: (NOTE) Calculated using the CKD-EPI Creatinine Equation (2021)    Anion gap 9 5 - 15    Comment: Performed at [facility], [address] [place], [place] [address]  CBC WITH DIFFERENTIAL     Status: Abnormal   Collection Time: 01/03/21  5:26 PM  Result Value Ref Range   WBC 11.4 (H) 4.0 - 10.5 K/uL   RBC 4.79 4.22 - 5.81 MIL/uL   Hemoglobin 14.6 13.0 - 17.0 g/dL   HCT 43.7 39.0 - 52.0 %   MCV 91.2 80.0 - 100.0 fL   MCH 30.5 26.0 - 34.0 pg   MCHC 33.4 30.0 - 36.0 g/dL   RDW 13.9 11.5 - 15.5 %   Platelets 150 150 - 400 K/uL   nRBC 0.0 0.0 - 0.2 %   Neutrophils Relative % 91 %   Neutro Abs 10.5 (H) 1.7 - 7.7 K/uL   Lymphocytes Relative 4 %   Lymphs Abs 0.5 (L) 0.7 - 4.0 K/uL   Monocytes Relative 3 %   Monocytes Absolute 0.3 0.1 - 1.0 K/uL   Eosinophils Relative 0 %   Eosinophils Absolute 0.1 0.0 - 0.5 K/uL   Basophils Relative 1 %   Basophils Absolute 0.1 0.0 - 0.1 K/uL   Immature Granulocytes 1 %   Abs Immature Granulocytes 0.06 0.00 - 0.07 K/uL    Comment: Performed at [name], [address] [place], [place] [address]  Protime-INR     Status: None   Collection Time: 01/03/21  5:26 PM  Result Value Ref Range   Prothrombin Time 13.3 11.4 - 15.2 seconds   INR 1.0 0.8 - 1.2    Comment: (NOTE) INR goal varies based on device and disease states. Performed at [facility], [address] [place], [place] [address]   APTT     Status: None   Collection Time: 01/03/21  5:26 PM  Result Value Ref Range   aPTT 30 24 - 36 seconds    Comment: Performed at [facility], [address] [place], [place] [address]  Urinalysis, Complete w Microscopic     Status: Abnormal   Collection Time: 01/03/21  5:26 PM  Result Value Ref Range   Color, Urine STRAW (A) YELLOW   APPearance CLEAR CLEAR   Specific Gravity, Urine 1.025 1.005 - 1.030   pH 7.0 5.0 - 8.0   Glucose, UA 500 (A) NEGATIVE mg/dL   Hgb urine dipstick  MODERATE (A) NEGATIVE   Bilirubin Urine NEGATIVE NEGATIVE   Ketones, ur 40 (A) NEGATIVE mg/dL   Protein, ur >300 (A) NEGATIVE mg/dL   Nitrite NEGATIVE NEGATIVE   Leukocytes,Ua NEGATIVE NEGATIVE   Squamous Epithelial / LPF 0-5 0 - 5   WBC, UA 0-5 0 - 5 WBC/hpf   RBC / HPF 6-10 0 - 5 RBC/hpf   Bacteria, UA NONE SEEN NONE SEEN    Comment: Performed at [facility], [address] [place],  [address]  Brain natriuretic peptide     Status: Abnormal   Collection Time: 01/03/21  5:26 PM  Result Value Ref Range   B Natriuretic Peptide 197.5 (H) 0.0 - 100.0 pg/mL    Comment: Performed at [facility], [place],  [address]  Troponin I (High Sensitivity)     Status: None   Collection Time: 01/03/21  5:26 PM  Result Value  Ref Range   Troponin I (High Sensitivity) 11 <18 ng/L    Comment: (NOTE) Elevated high sensitivity troponin I (hsTnI) values and significant  changes across serial measurements may suggest ACS but many other  chronic and acute conditions are known to elevate hsTnI results.  Refer to the "Links" section for chest pain algorithms and additional  guidance. Performed at [facility], [address] [place], [place] [address]   Blood culture (routine single)     Status: None (Preliminary result)   Collection Time: 01/03/21  5:37 PM   Specimen: BLOOD  Result Value Ref Range   Specimen Description      BLOOD BLOOD RIGHT FOREARM Performed at [facility], [address], [place], [place] [address]    Special Requests      BOTTLES DRAWN AEROBIC AND ANAEROBIC Blood Culture adequate volume Performed at [facility], [address] [place], [place] [address]    Culture  Setup Time      GRAM POSITIVE COCCI AEROBIC BOTTLE ONLY CRITICAL RESULT CALLED TO, READ BACK BY AND VERIFIED WITH: Coralie Carpen 01/04/21 [place] Performed at [place] [address], [place], [place] [address]     Culture GRAM POSITIVE COCCI    Report Status PENDING   Blood Culture ID Panel (Reflexed)     Status: Abnormal   Collection Time: 01/03/21  5:37 PM  Result Value Ref Range   Enterococcus faecalis NOT DETECTED NOT DETECTED   Enterococcus Faecium NOT DETECTED NOT DETECTED   Listeria monocytogenes NOT DETECTED NOT DETECTED   Staphylococcus species NOT DETECTED NOT DETECTED   Staphylococcus aureus (BCID) NOT DETECTED NOT DETECTED   Staphylococcus epidermidis NOT DETECTED NOT DETECTED   Staphylococcus lugdunensis NOT DETECTED NOT DETECTED   Streptococcus species DETECTED (A) NOT DETECTED    Comment: Not Enterococcus species, Streptococcus agalactiae, Streptococcus pyogenes, or Streptococcus pneumoniae. CRITICAL RESULT CALLED TO, READ BACK BY AND VERIFIED WITH: Aldona Bar RAUER 01/04/21 0749 KLW    Streptococcus agalactiae NOT DETECTED NOT DETECTED   Streptococcus pneumoniae NOT DETECTED NOT DETECTED   Streptococcus pyogenes NOT DETECTED NOT DETECTED   A.calcoaceticus-baumannii NOT DETECTED NOT DETECTED   Bacteroides fragilis NOT DETECTED NOT DETECTED   Enterobacterales NOT DETECTED NOT DETECTED   Enterobacter cloacae complex NOT DETECTED NOT DETECTED   Escherichia coli NOT DETECTED NOT DETECTED   Klebsiella aerogenes NOT DETECTED NOT DETECTED   Klebsiella oxytoca NOT DETECTED NOT DETECTED   Klebsiella pneumoniae NOT DETECTED NOT DETECTED   Proteus species NOT DETECTED NOT DETECTED   Salmonella species NOT DETECTED NOT DETECTED   Serratia marcescens NOT DETECTED NOT DETECTED   Haemophilus influenzae NOT DETECTED NOT DETECTED   Neisseria meningitidis NOT DETECTED NOT DETECTED   Pseudomonas aeruginosa NOT DETECTED NOT DETECTED   Stenotrophomonas maltophilia NOT DETECTED NOT DETECTED   Candida albicans NOT DETECTED NOT DETECTED   Candida auris NOT DETECTED NOT DETECTED   Candida glabrata NOT DETECTED NOT DETECTED   Candida krusei NOT DETECTED NOT DETECTED   Candida parapsilosis NOT DETECTED  NOT DETECTED   Candida tropicalis NOT DETECTED NOT DETECTED   Cryptococcus neoformans/gattii NOT DETECTED NOT DETECTED    Comment: Performed at [facility], [address] [place], [place] [address]  Blood gas, venous     Status: Abnormal   Collection Time: 01/03/21  6:09 PM  Result Value Ref Range   pH, Ven 7.47 (H) 7.250 - 7.430   pCO2, Ven 42 (L) 44.0 - 60.0 mmHg  pO2, Ven 36.0 32.0 - 45.0 mmHg   Bicarbonate 30.6 (H) 20.0 - 28.0 mmol/L   Acid-Base Excess 6.2 (H) 0.0 - 2.0 mmol/L   O2 Saturation 73.5 %   Patient temperature 37.0    Collection site VENOUS    Sample type VENOUS     Comment: Performed at [facility], [address], [place], [place] [address]  Resp Panel by RT-PCR (Flu A&B, Covid) Nasopharyngeal Swab     Status: None   Collection Time: 01/03/21  6:24 PM   Specimen: Nasopharyngeal Swab; Nasopharyngeal(NP) swabs in vial transport medium  Result Value Ref Range   SARS Coronavirus 2 by RT PCR NEGATIVE NEGATIVE    Comment: (NOTE) SARS-CoV-2 target nucleic acids are NOT DETECTED.  The SARS-CoV-2 RNA is generally detectable in upper respiratory specimens during the acute phase of infection. The lowest concentration of SARS-CoV-2 viral copies this assay can detect is 138 copies/mL. A negative result does not preclude SARS-Cov-2 infection and should not be used as the sole basis for treatment or other patient management decisions. A negative result may occur with  improper specimen collection/handling, submission of specimen other than nasopharyngeal swab, presence of viral mutation(s) within the areas targeted by this assay, and inadequate number of viral copies(<138 copies/mL). A negative result must be combined with clinical observations, patient history, and epidemiological information. The expected result is Negative.  Fact Sheet for Patients:  EntrepreneurPulse.com.au  Fact Sheet for Healthcare Providers:   IncredibleEmployment.be  This test is no t yet approved or cleared by the [place] FDA and  has been authorized for detection and/or diagnosis of SARS-CoV-2 by FDA under an Emergency Use Authorization (EUA). This EUA will remain  in effect (meaning this test can be used) for the duration of the COVID-19 declaration under Section 564(b)(1) of the Act, 21 U.S.C.section 360bbb-3(b)(1), unless the authorization is terminated  or revoked sooner.       Influenza A by PCR NEGATIVE NEGATIVE   Influenza B by PCR NEGATIVE NEGATIVE    Comment: (NOTE) The Xpert Xpress SARS-CoV-2/FLU/RSV plus assay is intended as an aid in the diagnosis of influenza from Nasopharyngeal swab specimens and should not be used as a sole basis for treatment. Nasal washings and aspirates are unacceptable for Xpert Xpress SARS-CoV-2/FLU/RSV testing.  Fact Sheet for Patients: EntrepreneurPulse.com.au  Fact Sheet for Healthcare Providers: IncredibleEmployment.be  This test is not yet approved or cleared by the [place] FDA and has been authorized for detection and/or diagnosis of SARS-CoV-2 by FDA under an Emergency Use Authorization (EUA). This EUA will remain in effect (meaning this test can be used) for the duration of the COVID-19 declaration under Section 564(b)(1) of the Act, 21 U.S.C. section 360bbb-3(b)(1), unless the authorization is terminated or revoked.  Performed at [facility], [address] [place], [place] [address]   Blood culture (single)     Status: None (Preliminary result)   Collection Time: 01/03/21  7:15 PM   Specimen: BLOOD  Result Value Ref Range   Specimen Description BLOOD BLOOD LEFT HAND    Special Requests      BOTTLES DRAWN AEROBIC AND ANAEROBIC Blood Culture results may not be optimal due to an excessive volume of blood received in culture bottles   Culture  Setup Time      GRAM POSITIVE  COCCI ANAEROBIC BOTTLE ONLY CRITICAL RESULT CALLED TO, READ BACK BY AND VERIFIED WITH: Coralie Carpen 01/04/21 [place] Performed at [facility], [place],  [place] [address]    Culture GRAM POSITIVE COCCI    Report Status PENDING   Lactic acid, plasma     Status: None   Collection Time: 01/03/21  7:27 PM  Result Value Ref Range   Lactic Acid, Venous 1.1 0.5 - 1.9 mmol/L    Comment: Performed at [facility], [place], [place] [address]  Troponin I (High Sensitivity)     Status: Abnormal   Collection Time: 01/03/21  7:27 PM  Result Value Ref Range   Troponin I (High Sensitivity) 22 (H) <18 ng/L    Comment: (NOTE) Elevated high sensitivity troponin I (hsTnI) values and significant  changes across serial measurements may suggest ACS but many other  chronic and acute conditions are known to elevate hsTnI results.  Refer to the "Links" section for chest pain algorithms and additional  guidance. Performed at [facility], [address] [place], [place] [address]   CBG monitoring, ED     Status: Abnormal   Collection Time: 01/03/21 11:01 PM  Result Value Ref Range   Glucose-Capillary 291 (H) 70 - 99 mg/dL    Comment: Glucose reference range applies only to samples taken after fasting for at least 8 hours.  Comprehensive metabolic panel     Status: Abnormal   Collection Time: 01/04/21  6:38 AM  Result Value Ref Range   Sodium 134 (L) 135 - 145 mmol/L   Potassium 3.8 3.5 - 5.1 mmol/L   Chloride 100 98 - 111 mmol/L   CO2 26 22 - 32 mmol/L   Glucose, Bld 317 (H) 70 - 99 mg/dL    Comment: Glucose reference range applies only to samples taken after fasting for at least 8 hours.   BUN 21 8 - 23 mg/dL   Creatinine, Ser 0.98 0.61 - 1.24 mg/dL   Calcium 8.5 (L) 8.9 - 10.3 mg/dL   Total Protein 6.5 6.5 - 8.1 g/dL   Albumin 2.9 (L) 3.5 - 5.0 g/dL   AST 12 (L) 15 - 41 U/L   ALT 11 0 - 44 U/L   Alkaline Phosphatase 87 38 - 126 U/L    Total Bilirubin 0.7 0.3 - 1.2 mg/dL   GFR, Estimated >60 >60 mL/min    Comment: (NOTE) Calculated using the CKD-EPI Creatinine Equation (2021)    Anion gap 8 5 - 15    Comment: Performed at [facility], [address] [place], [place] [address]  CBC WITH DIFFERENTIAL     Status: Abnormal   Collection Time: 01/04/21  6:38 AM  Result Value Ref Range   WBC 12.0 (H) 4.0 - 10.5 K/uL   RBC 4.47 4.22 - 5.81 MIL/uL   Hemoglobin 13.4 13.0 - 17.0 g/dL   HCT 40.7 39.0 - 52.0 %   MCV 91.1 80.0 - 100.0 fL   MCH 30.0 26.0 - 34.0 pg   MCHC 32.9 30.0 - 36.0 g/dL   RDW 14.3 11.5 - 15.5 %   Platelets 148 (L) 150 - 400 K/uL   nRBC 0.0 0.0 - 0.2 %   Neutrophils Relative % 92 %   Neutro Abs 11.2 (H) 1.7 - 7.7 K/uL   Lymphocytes Relative 4 %   Lymphs Abs 0.5 (L) 0.7 - 4.0 K/uL   Monocytes Relative 2 %   Monocytes Absolute 0.2 0.1 - 1.0 K/uL   Eosinophils Relative 0 %   Eosinophils Absolute 0.0 0.0 - 0.5 K/uL   Basophils Relative 1 %   Basophils Absolute 0.1  0.0 - 0.1 K/uL   Immature Granulocytes 1 %   Abs Immature Granulocytes 0.08 (H) 0.00 - 0.07 K/uL    Comment: Performed at [facility], [address] [place], [place] [address]  MRSA Next Gen by PCR, Nasal     Status: None   Collection Time: 01/04/21  7:52 AM   Specimen: Nasal Mucosa; Nasal Swab  Result Value Ref Range   MRSA by PCR Next Gen NOT DETECTED NOT DETECTED    Comment: (NOTE) The GeneXpert MRSA Assay (FDA approved for NASAL specimens only), is one component of a comprehensive MRSA colonization surveillance program. It is not intended to diagnose MRSA infection nor to guide or monitor treatment for MRSA infections. Test performance is not FDA approved in patients less than 46 years old. Performed at [facility], [address] [place], [place] [address]   Glucose, capillary     Status: Abnormal   Collection Time: 01/04/21  8:27 AM  Result Value Ref Range   Glucose-Capillary 355 (H) 70 -  99 mg/dL    Comment: Glucose reference range applies only to samples taken after fasting for at least 8 hours.  Glucose, capillary     Status: Abnormal   Collection Time: 01/04/21 11:57 AM  Result Value Ref Range   Glucose-Capillary 305 (H) 70 - 99 mg/dL    Comment: Glucose reference range applies only to samples taken after fasting for at least 8 hours.   MR FOOT LEFT WO CONTRAST  Result Date: 01/04/2021 CLINICAL DATA:  Diabetic foot ulcer. EXAM: MRI OF THE LEFT FOOT WITHOUT CONTRAST TECHNIQUE: Multiplanar, multisequence MR imaging of the left foot was performed. No intravenous contrast was administered. COMPARISON:  Radiographs 01/03/2021. MRI 02/23/2019 FINDINGS: There is a ulceration noted along the plantar lateral aspect of the forefoot near the level of the fifth metatarsal head. No underlying subcutaneous abscess is identified. Diffuse subcutaneous soft tissue swelling/edema/fluid suggesting cellulitis. No soft tissue abscesses are identified for certain without contrast. Diffuse myositis without findings for pyomyositis. Surgical changes related to a prior amputation involving the third digit and third distal metatarsal. No findings suspicious septic arthritis or osteomyelitis. Chronic cystic type degenerative changes are noted in the bases of the third, fourth and fifth metatarsals. The major tendons and ligaments appear grossly intact. IMPRESSION: 1. Soft tissue ulceration along the plantar lateral aspect of the forefoot near the level of the fifth metatarsal head. No underlying subcutaneous abscess. 2. Diffuse cellulitis and myositis. 3. No findings for septic arthritis or osteomyelitis. Electronically Signed   By: Marijo Sanes M.D.   On: 01/04/2021 05:08   DG Chest Port 1 View  Result Date: 01/03/2021 CLINICAL DATA:  Sepsis.  Evaluate for infiltrate. EXAM: PORTABLE CHEST 1 VIEW COMPARISON:  01/06/2020 FINDINGS: Previous median sternotomy and CABG procedure. Stable cardiomediastinal contours.  Pulmonary vascular congestion. No pleural effusion or edema. No airspace densities. IMPRESSION: Pulmonary vascular congestion. Electronically Signed   By: Kerby Moors M.D.   On: 01/03/2021 18:02   DG Foot Complete Left  Result Date: 01/03/2021 CLINICAL DATA:  Evaluate for osteomyelitis. EXAM: LEFT FOOT - COMPLETE 3+ VIEW COMPARISON:  12/28/2020 FINDINGS: Status post third ray amputation at the level of the head of the third metatarsal bone. Diffuse soft tissue swelling is identified. There is a focal bone erosion involving the lateral aspect of the head of the fifth metatarsal bone. This is a new finding compared with 02/25/2020, but appears similar to 12/28/20. No additional focal bone erosions. No fracture  or dislocation. No radio-opaque foreign bodies. IMPRESSION: 1. Chronic bone erosion involving the lateral aspect of the head of the fifth metatarsal bone compatible with osteomyelitis. Unchanged from 12/28/2020 but new compared with 02/25/2020. 2. Diffuse soft tissue swelling. 3. Status post third ray amputation. Electronically Signed   By: Kerby Moors M.D.   On: 01/03/2021 19:51    Blood pressure 116/62, pulse 86, temperature 98.4 F (36.9 C), resp. rate 17, height '6\' 4"'$  (1.93 m), weight (!) 152 kg, SpO2 97 %.  Assessment Sepsis secondary to cellulitis left foot secondary to chronic open ulceration left fifth metatarsal phalangeal joint plantar Diabetes type 2 polyneuropathy PVD Equinus bilateral Noncompliance with medical treatment  Plan -Patient seen and examined. -X-ray imaging and MRI imaging reviewed and discussed with patient and patient family in detail.  Does not appear to show osteomyelitis on the MRI.  Appears show diffuse cellulitis though throughout the left foot and swelling. -Swelling and cellulitic changes appear to be improved to the left foot today.  No bone exposed through the area of the ulcerative site. -Wound debridement performed as described below without  incident. -Wound culture taken and sent off. -Blood culture grew strep bacteria.  Appreciate medicine recommendations for antibiotic therapy.  White blood cell count 12, patient currently afebrile. -Discussed treatment options with the patient both conservative and surgical attempts at correction include potential risks and complications of surgical invention.  Discussed that patient has had this ulceration subfifth metatarsal phalangeal joint for a number of months now and without improvement of all conservative measures available.  Discussed with patient that best treatment option at this time would be to perform 5th metatarsal head resection with potential closure of wound and tendo Achilles lengthening to try to reduce forefoot pressure.  No guarantees given.  Discussed with patient that he is at high risk for limb loss due to history of diabetes and PVD as well as history of noncompliance.  Patient will be required to be nonweightbearing for his series of time after the procedure until the Achilles area heals and the ulcerative site heals.  Could take up to 4 to 6 weeks.  We will order physical therapy after procedure. -Plan to perform procedure on Friday, 01/06/2021 at 12 PM.  Patient will be n.p.o. at midnight for surgery the day before. -Appreciate vascular recommendations.  Planning for angiogram tomorrow before procedure. -Discussed with patient that long-term I would still like him to follow-up with the wound care center due to having chronic blisters that occur to bilateral lower extremities due to swelling that occurs.  Patient would benefit from continued Profore dressings.  Debridment of ulcer: Location:  L plantar 5th MTPJ Pre-debridement measurement: 0.7 x 0.6 x 0.4cm Post-debridement measurement: same Tissue removed:   fibrous/HPK tissue, biofilm Ulcer was debrided sharply with combination of tissue nippers and scalpel blade into the subcutaneous tissue, 100% excisional   Caroline More, DPM 01/04/2021, 1:07 PM

## 2021-01-04 NOTE — ED Notes (Signed)
Pt accidentally pulled out iv.  Iv restarted.

## 2021-01-04 NOTE — Plan of Care (Signed)

## 2021-01-04 NOTE — Op Note (Signed)
[facility]  Percutaneous Study/Intervention Procedural Note   Date of Surgery: 01/04/2021  Surgeon(s):Storie Heffern    Assistants:none  Pre-operative Diagnosis: PAD with ulceration left lower extremity  Post-operative diagnosis:  Same  Procedure(s) Performed:             1.  Ultrasound guidance for vascular access right femoral artery             2.  Catheter placement into left superficial femoral artery from right femoral approach             3.  Aortogram and selective left lower extremity angiogram             4.   StarClose closure device right femoral artery  EBL: 10 cc  Contrast: 45 cc  Fluoro Time: 1.9 minutes  Moderate Conscious Sedation Time: approximately 15 minutes using 2 mg of Versed and 50 mcg of Fentanyl              Indications:  Patient is a 65 y.o.male with longstanding nonhealing ulceration history of peripheral arterial disease with previous interventions. The patient is brought in for angiography for further evaluation and potential treatment.  Due to the limb threatening nature of the situation, angiogram was performed for attempted limb salvage. The patient is aware that if the procedure fails, amputation would be expected.  The patient also understands that even with successful revascularization, amputation may still be required due to the severity of the situation.  Risks and benefits are discussed and informed consent is obtained.   Procedure:  The patient was identified and appropriate procedural time out was performed.  The patient was then placed supine on the table and prepped and draped in the usual sterile fashion. Moderate conscious sedation was administered during a face to face encounter with the patient throughout the procedure with my supervision of the RN administering medicines and monitoring the patient's vital signs, pulse oximetry, telemetry and mental status throughout from the start of the procedure until the patient was  taken to the recovery room. Ultrasound was used to evaluate the right common femoral artery.  It was patent .  A digital ultrasound image was acquired.  A Seldinger needle was used to access the right common femoral artery under direct ultrasound guidance and a permanent image was performed.  A 0.035 J wire was advanced without resistance and a 5Fr sheath was placed.  Pigtail catheter was placed into the aorta and an AP aortogram was performed. This demonstrated normal renal arteries and normal aorta and iliac segments without significant stenosis. I then crossed the aortic bifurcation and advanced to the left femoral head. Selective left lower extremity angiogram was then performed.  After the initial images of the SFA and common femoral artery, the catheter was advanced into the proximal to mid left SFA to help opacify distally.  This demonstrated normal common femoral artery, profunda femoris artery, and mild disease in the superficial femoral and popliteal arteries without significant stenosis of greater than 30%.  There was a normal tibial trifurcation with occlusion of the anterior tibial artery proximally without good reconstitution distally.  There was a normal tibioperoneal trunk, peroneal artery, and a very large posterior tibial artery that filled the foot well with good perfusion and no focal stenosis.  No intervention is required and the patient has adequate arterial perfusion for wound healing.  I elected to terminate the procedure. The sheath was removed and StarClose closure device was deployed in the right  femoral artery with excellent hemostatic result. The patient was taken to the recovery room in stable condition having tolerated the procedure well.  Findings:               Aortogram: Normal renal arteries, normal aorta and iliac arteries without significant stenosis             Left lower Extremity: Normal common femoral artery, profunda femoris artery, and mild disease in the superficial  femoral and popliteal arteries without significant stenosis of greater than 30%.  There was a normal tibial trifurcation with occlusion of the anterior tibial artery proximally without good reconstitution distally.  There was a normal tibioperoneal trunk, peroneal artery, and a very large posterior tibial artery that filled the foot well with good perfusion and no focal stenosis.   Disposition: Patient was taken to the recovery room in stable condition having tolerated the procedure well.  Complications: None  [name] 01/04/2021 6:02 PM   This note was created with Dragon Medical transcription system. Any errors in dictation are purely unintentional.

## 2021-01-04 NOTE — Progress Notes (Signed)
PHARMACY - PHYSICIAN COMMUNICATION CRITICAL VALUE ALERT - BLOOD CULTURE IDENTIFICATION (BCID)  [name]. is an 65 y.o. male who presented to [facility] on 01/03/2021 with a chief complaint of feeling cold and weak.    Assessment:  Blood cultures from 9/6 with GPC in both sets.  BCID detects streptococcus speciesPatient has chronic leg wounds and is seen at wound clinic.  Per EDP, concerns for L ankle cellulitis.    Name of physician (or Provider) Contacted: Dr Reesa Chew  Current antibiotics: vancomycin, cefepime, metronidazole  Changes to prescribed antibiotics recommended:  Recommendations accepted by provider - change to ceftriaxone  Results for orders placed or performed during the hospital encounter of 01/03/21  Blood Culture ID Panel (Reflexed) (Collected: 01/03/2021  5:37 PM)  Result Value Ref Range   Enterococcus faecalis NOT DETECTED NOT DETECTED   Enterococcus Faecium NOT DETECTED NOT DETECTED   Listeria monocytogenes NOT DETECTED NOT DETECTED   Staphylococcus species NOT DETECTED NOT DETECTED   Staphylococcus aureus (BCID) NOT DETECTED NOT DETECTED   Staphylococcus epidermidis NOT DETECTED NOT DETECTED   Staphylococcus lugdunensis NOT DETECTED NOT DETECTED   Streptococcus species DETECTED (A) NOT DETECTED   Streptococcus agalactiae NOT DETECTED NOT DETECTED   Streptococcus pneumoniae NOT DETECTED NOT DETECTED   Streptococcus pyogenes NOT DETECTED NOT DETECTED   A.calcoaceticus-baumannii NOT DETECTED NOT DETECTED   Bacteroides fragilis NOT DETECTED NOT DETECTED   Enterobacterales NOT DETECTED NOT DETECTED   Enterobacter cloacae complex NOT DETECTED NOT DETECTED   Escherichia coli NOT DETECTED NOT DETECTED   Klebsiella aerogenes NOT DETECTED NOT DETECTED   Klebsiella oxytoca NOT DETECTED NOT DETECTED   Klebsiella pneumoniae NOT DETECTED NOT DETECTED   Proteus species NOT DETECTED NOT DETECTED   Salmonella species NOT DETECTED NOT DETECTED   Serratia marcescens NOT  DETECTED NOT DETECTED   Haemophilus influenzae NOT DETECTED NOT DETECTED   Neisseria meningitidis NOT DETECTED NOT DETECTED   Pseudomonas aeruginosa NOT DETECTED NOT DETECTED   Stenotrophomonas maltophilia NOT DETECTED NOT DETECTED   Candida albicans NOT DETECTED NOT DETECTED   Candida auris NOT DETECTED NOT DETECTED   Candida glabrata NOT DETECTED NOT DETECTED   Candida krusei NOT DETECTED NOT DETECTED   Candida parapsilosis NOT DETECTED NOT DETECTED   Candida tropicalis NOT DETECTED NOT DETECTED   Cryptococcus neoformans/gattii NOT DETECTED NOT DETECTED   Doreene Eland, PharmD, BCPS.   Work Cell: [phone] 01/04/2021 8:41 AM

## 2021-01-04 NOTE — ED Notes (Signed)
Report messaged to marc m rn floor nurse

## 2021-01-04 NOTE — Consult Note (Signed)
[facility] Consult Note  MRN : [id number]  Alec Pencek. is a 65 y.o. (12-30-1955) male who presents with chief complaint of  Chief Complaint  Patient presents with   Near Syncope  .  History of Present Illness: I am asked to see the patient by Dr. Luana Shu for evaluation of nonhealing ulceration of the left foot.  He underwent angiogram with intervention about a year ago and has had previous interventions in years past.  He had been seen earlier this year and his perfusion was found to be stable on noninvasive studies.  At this point, his wound has still been going on for almost a year and he is in the hospital requiring debridement to the wound.  As such, further vascular evaluation is warranted to evaluate his perfusion for wound healing.  He is not complaining of any pain currently.  He is actually quite somnolent.  His wife provides much of the history.  Current Facility-Administered Medications  Medication Dose Route Frequency Provider Last Rate Last Admin   0.9 %  sodium chloride infusion   Intravenous Continuous Howington, Einar Pheasant, MD 75 mL/hr at 01/04/21 0927 New Bag at 01/04/21 0927   [MAR Hold] acetaminophen (TYLENOL) tablet 650 mg  650 mg Oral Q6H PRN Jacqlyn Krauss, MD   650 mg at 01/04/21 0152   Or   [MAR Hold] acetaminophen (TYLENOL) suppository 650 mg  650 mg Rectal Q6H PRN Howington, Einar Pheasant, MD       [MAR Hold] apixaban Arne Cleveland) tablet 5 mg  5 mg Oral BID Howington, Einar Pheasant, MD   5 mg at 01/04/21 0914   [MAR Hold] atorvastatin (LIPITOR) tablet 20 mg  20 mg Oral QHS Beers, Shanon Brow, RPH       [START ON 01/05/2021] ceFAZolin (ANCEF) IVPB 2g/100 mL premix  2 g Intravenous On Call to OR Schnier, Dolores Lory, MD       [MAR Hold] cefTRIAXone (ROCEPHIN) 2 g in sodium chloride 0.9 % 100 mL IVPB  2 g Intravenous Q24H Lorella Nimrod, MD 200 mL/hr at 01/04/21 1551 2 g at 01/04/21 1551   [MAR Hold] clopidogrel (PLAVIX) tablet 75 mg  75 mg Oral  Daily Lorna Dibble, [facility]       [MAR Hold] ezetimibe (ZETIA) tablet 10 mg  10 mg Oral QHS Lorna Dibble, [facility]       [facility] Hold] insulin aspart (novoLOG) injection 0-15 Units  0-15 Units Subcutaneous TID WC Howington, Einar Pheasant, MD   4 Units at 01/04/21 1211   [MAR Hold] insulin aspart (novoLOG) injection 0-5 Units  0-5 Units Subcutaneous QHS Howington, Einar Pheasant, MD   3 Units at 01/03/21 2315   [MAR Hold] insulin aspart (novoLOG) injection 4 Units  4 Units Subcutaneous TID WC Lorella Nimrod, MD       [MAR Hold] insulin glargine-yfgn (SEMGLEE) injection 15 Units  15 Units Subcutaneous BID Lorella Nimrod, MD   15 Units at 01/04/21 1211   [MAR Hold] ondansetron (ZOFRAN) tablet 4 mg  4 mg Oral Q6H PRN Howington, Einar Pheasant, MD       Or   [facility] Hold] ondansetron (ZOFRAN) injection 4 mg  4 mg Intravenous Q6H PRN Howington, Einar Pheasant, MD       Facility-Administered Medications Ordered in Other Encounters  Medication Dose Route Frequency Provider Last Rate Last Admin   sodium chloride flush (NS) 0.9 % injection 3 mL  3 mL Intravenous Q12H Teodoro Spray, MD  Past Medical History:  Diagnosis Date   Asthma    Coronary artery disease    Depression    Diabetes mellitus without complication ([place])    Gout    History anabolic steroid use    Hyperlipidemia    Hypertension    Hypogonadism in male    Morbid obesity ([place])    Myocardial infarction ([place])    Peripheral vascular disease ([place])    Perirectal abscess    Pleurisy    Sleep apnea    CPAP at night, no oxygen   Varicella     Past Surgical History:  Procedure Laterality Date   ABDOMINAL AORTIC ANEURYSM REPAIR     AMPUTATION TOE Right 02/10/2016   Procedure: AMPUTATION TOE 3RD TOE;  Surgeon: Samara Deist, DPM;  Location: [facility] ORS;  Service: Podiatry;  Laterality: Right;   AMPUTATION TOE Left 02/24/2020   Procedure: AMPUTATION TOE;  Surgeon: Caroline More, DPM;  Location: [facility] OR[facility];  Service: Podiatry;  Laterality: Left;   APPLICATION OF  WOUND VAC Left 02/29/2020   Procedure: APPLICATION OF WOUND VAC;  Surgeon: Caroline More, DPM;  Location: [facility];  Service: Podiatry;  Laterality: Left;   COLONOSCOPY WITH PROPOFOL [name] 11/18/2015   Procedure: COLONOSCOPY WITH PROPOFOL;  Surgeon: Manya Silvas, MD;  [facility] END[facility];  Service: Endoscopy;  Laterality: [name];   CORONARY ARTERY BYPASS GRAFT     CORONARY STENT INTERVENTION [name] 02/02/2020   Procedure: CORONARY STENT INTERVENTION;  Surgeon: Isaias Cowman, MD;  Location: [place] [facility];  Service: Cardiovascular;  Laterality: [name];   IRRIGATION AND DEBRIDEMENT FOOT Left 02/29/2020   Procedure: IRRIGATION AND DEBRIDEMENT FOOT;  Surgeon: Caroline More, DPM;  Location: [facility];  Service: Podiatry;  Laterality: Left;   IRRIGATION AND DEBRIDEMENT FOOT Left 02/24/2020   Procedure: IRRIGATION AND DEBRIDEMENT FOOT;  Surgeon: Caroline More, DPM;  Location: [facility] OR[facility];  Service: Podiatry;  Laterality: Left;   KNEE ARTHROSCOPY     LEFT HEART CATH AND CORS/GRAFTS ANGIOGRAPHY [name] 02/02/2020   Procedure: LEFT HEART CATH AND CORS/GRAFTS ANGIOGRAPHY;  Surgeon: Teodoro Spray, MD;  Location: [place] [facility];  Service: Cardiovascular;  Laterality: [name];   LOWER EXTREMITY ANGIOGRAPHY Left 02/25/2020   Procedure: Lower Extremity Angiography;  Surgeon: Algernon Huxley, MD;  Location: [place] [facility];  Service: Cardiovascular;  Laterality: Left;   PERIPHERAL VASCULAR CATHETERIZATION Right 01/24/2016   Procedure: Lower Extremity Angiography;  Surgeon: Katha Cabal, MD;  Location: [place] [facility];  Service: Cardiovascular;  Laterality: Right;   PERIPHERAL VASCULAR CATHETERIZATION Right 01/25/2016   Procedure: Lower Extremity Angiography;  Surgeon: Katha Cabal, MD;  Location: [place] [facility];  Service: Cardiovascular;  Laterality: Right;   TOE AMPUTATION     TONSILLECTOMY       Social History   Tobacco Use   Smoking status: Former    Packs/day: 0.50    Years:  45.00    Pack years: 22.50    Types: Cigarettes    Quit date: 04/05/2015    Years since quitting: 5.7   Smokeless tobacco: Never  Vaping Use   Vaping Use: Every day  Substance Use Topics   Alcohol use: Not Currently    Alcohol/week: 3.0 standard drinks    Types: 3 Glasses of wine per week   Drug use: No    Family History No bleeding disorders, clotting disorders, autoimmune diseases, or aneurysms  Allergies  Allergen Reactions   Statins     Other reaction(s): Muscle Pain  Causes legs to ache per pt     REVIEW OF SYSTEMS (Negative unless checked)  Constitutional: '[]'$ Weight loss  '[]'$ Fever  '[]'$ Chills Cardiac: '[]'$ Chest pain   '[]'$ Chest pressure   '[]'$ Palpitations   '[]'$ Shortness of breath when laying flat   '[]'$ Shortness of breath at rest   '[]'$ Shortness of breath with exertion. Vascular:  '[]'$ Pain in legs with walking   '[]'$ Pain in legs at rest   '[]'$ Pain in legs when laying flat   '[]'$ Claudication   '[]'$ Pain in feet when walking  '[]'$ Pain in feet at rest  '[]'$ Pain in feet when laying flat   '[]'$ History of DVT   '[]'$ Phlebitis   '[]'$ Swelling in legs   '[]'$ Varicose veins   '[x]'$ Non-healing ulcers Pulmonary:   '[]'$ Uses home oxygen   '[]'$ Productive cough   '[]'$ Hemoptysis   '[]'$ Wheeze  '[]'$ COPD   '[]'$ Asthma Neurologic:  '[]'$ Dizziness  '[]'$ Blackouts   '[]'$ Seizures   '[]'$ History of stroke   '[]'$ History of TIA  '[]'$ Aphasia   '[]'$ Temporary blindness   '[]'$ Dysphagia   '[]'$ Weakness or numbness in arms   '[]'$ Weakness or numbness in legs Musculoskeletal:  '[x]'$ Arthritis   '[]'$ Joint swelling   '[x]'$ Joint pain   '[]'$ Low back pain Hematologic:  '[]'$ Easy bruising  '[]'$ Easy bleeding   '[]'$ Hypercoagulable state   '[]'$ Anemic  '[]'$ Hepatitis Gastrointestinal:  '[]'$ Blood in stool   '[]'$ Vomiting blood  '[]'$ Gastroesophageal reflux/heartburn   '[]'$ Difficulty swallowing. Genitourinary:  '[]'$ Chronic kidney disease   '[]'$ Difficult urination  '[]'$ Frequent urination  '[]'$ Burning with urination   '[]'$ Blood in urine Skin:  '[]'$ Rashes   '[x]'$ Ulcers   '[x]'$ Wounds Psychological:  '[]'$ History of anxiety   '[]'$  History of major  depression.  Physical Examination  Vitals:   01/04/21 0828 01/04/21 1159 01/04/21 1652 01/04/21 1703  BP: 116/62 116/62 (!) 135/59   Pulse: (!) 106 86 79 82  Resp: '18 17 18 15  '$ Temp: 99.4 F (37.4 C) 98.4 F (36.9 C) 98.1 F (36.7 C) (!) 97.5 F (36.4 C)  TempSrc:      SpO2: 95% 97% 99% 99%  Weight:    (!) 152 kg  Height:    '6\' 4"'$  (1.93 m)   Body mass index is 40.79 kg/m. Gen:  WD/WN, NAD Head: [place]/AT, No temporalis wasting.  Ear/Nose/Throat: Hearing grossly intact, nares w/o erythema or drainage, oropharynx w/o Erythema/Exudate Eyes: Sclera non-icteric, conjunctiva clear Neck: Trachea midline.  No JVD.  Pulmonary:  Good air movement, respirations not labored, equal bilaterally.  Cardiac: RRR, normal S1, S2. Vascular:  Vessel Right Left  Radial Palpable Palpable                          PT Not palpable Not palpable  DP Not palpable Not palpable   Gastrointestinal: soft, non-tender/non-distended. No guarding/reflex.  Musculoskeletal: M/S 5/5 throughout.  Left foot and ankle in a large bulky dressing.  Lower legs are warm.  2+ bilateral lower extremity edema.  Moderate stasis dermatitis changes present bilaterally Neurologic: Sensation grossly intact in extremities.  Symmetrical.  Speech is fluent. Motor exam as listed above. Psychiatric: Judgment intact, Mood & affect appropriate for pt's clinical situation. Dermatologic: Left foot wound currently dressed     CBC Lab Results  Component Value Date   WBC 12.0 (H) 01/04/2021   HGB 13.4 01/04/2021   HCT 40.7 01/04/2021   MCV 91.1 01/04/2021   PLT 148 (L) 01/04/2021    BMET    Component Value Date/Time   NA 134 (L) 01/04/2021 0638   NA 141 07/12/2011 0919   K 3.8  01/04/2021 0638   K 4.4 07/12/2011 0919   CL 100 01/04/2021 0638   CL 102 07/12/2011 0919   CO2 26 01/04/2021 0638   CO2 29 07/12/2011 0919   GLUCOSE 317 (H) 01/04/2021 0638   GLUCOSE 76 07/12/2011 0919   BUN 21 01/04/2021 0638   BUN 16  05/05/2013 1022   CREATININE 0.98 01/04/2021 0638   CREATININE 0.71 05/05/2013 1022   CALCIUM 8.5 (L) 01/04/2021 0638   CALCIUM 8.8 07/12/2011 0919   GFRNONAA >60 01/04/2021 0638   GFRNONAA >60 05/05/2013 1022   GFRAA >60 01/06/2020 1853   GFRAA >60 05/05/2013 1022   Estimated Creatinine Clearance: 120 mL/min (by C-G formula based on SCr of 0.98 mg/dL).  COAG Lab Results  Component Value Date   INR 1.0 01/03/2021   INR 1.3 (H) 02/24/2020    Radiology MR FOOT LEFT WO CONTRAST  Result Date: 01/04/2021 CLINICAL DATA:  Diabetic foot ulcer. EXAM: MRI OF THE LEFT FOOT WITHOUT CONTRAST TECHNIQUE: Multiplanar, multisequence MR imaging of the left foot was performed. No intravenous contrast was administered. COMPARISON:  Radiographs 01/03/2021. MRI 02/23/2019 FINDINGS: There is a ulceration noted along the plantar lateral aspect of the forefoot near the level of the fifth metatarsal head. No underlying subcutaneous abscess is identified. Diffuse subcutaneous soft tissue swelling/edema/fluid suggesting cellulitis. No soft tissue abscesses are identified for certain without contrast. Diffuse myositis without findings for pyomyositis. Surgical changes related to a prior amputation involving the third digit and third distal metatarsal. No findings suspicious septic arthritis or osteomyelitis. Chronic cystic type degenerative changes are noted in the bases of the third, fourth and fifth metatarsals. The major tendons and ligaments appear grossly intact. IMPRESSION: 1. Soft tissue ulceration along the plantar lateral aspect of the forefoot near the level of the fifth metatarsal head. No underlying subcutaneous abscess. 2. Diffuse cellulitis and myositis. 3. No findings for septic arthritis or osteomyelitis. Electronically Signed   By: Marijo Sanes M.D.   On: 01/04/2021 05:08   DG Chest Port 1 View  Result Date: 01/03/2021 CLINICAL DATA:  Sepsis.  Evaluate for infiltrate. EXAM: PORTABLE CHEST 1 VIEW  COMPARISON:  01/06/2020 FINDINGS: Previous median sternotomy and CABG procedure. Stable cardiomediastinal contours. Pulmonary vascular congestion. No pleural effusion or edema. No airspace densities. IMPRESSION: Pulmonary vascular congestion. Electronically Signed   By: Kerby Moors M.D.   On: 01/03/2021 18:02   DG Foot Complete Left  Result Date: 01/03/2021 CLINICAL DATA:  Evaluate for osteomyelitis. EXAM: LEFT FOOT - COMPLETE 3+ VIEW COMPARISON:  12/28/2020 FINDINGS: Status post third ray amputation at the level of the head of the third metatarsal bone. Diffuse soft tissue swelling is identified. There is a focal bone erosion involving the lateral aspect of the head of the fifth metatarsal bone. This is a new finding compared with 02/25/2020, but appears similar to 12/28/20. No additional focal bone erosions. No fracture or dislocation. No radio-opaque foreign bodies. IMPRESSION: 1. Chronic bone erosion involving the lateral aspect of the head of the fifth metatarsal bone compatible with osteomyelitis. Unchanged from 12/28/2020 but new compared with 02/25/2020. 2. Diffuse soft tissue swelling. 3. Status post third ray amputation. Electronically Signed   By: Kerby Moors M.D.   On: 01/03/2021 19:51   DG Foot Complete Left  Result Date: 12/31/2020 CLINICAL DATA:  Diabetic ulcer EXAM: LEFT FOOT - COMPLETE 3+ VIEW COMPARISON:  Prior radiographs of the left foot 02/25/2020 FINDINGS: Surgical changes of prior partial amputation of the third toe through the mid aspect  of the metatarsal. The amputation site appears well healed. Focal soft tissue swelling present throughout the forefoot. A small rounded lucency is present underlying the fifth MTP joint. There is some high attenuation material within the lucency which is nonspecific and indeterminate. No rare fraction or destructive changes of the visualized bones to suggest osteomyelitis. No acute fracture or malalignment. No radiopaque foreign body. IMPRESSION:  1. Soft tissue ulceration along the plantar surface of the fifth MTP joint. Some internal radiodense material may represent a small amount of packing material, or mineralization of the soft tissues. 2. Surgical changes of prior healed partial amputation of the third toe. 3. Diffuse soft tissue swelling throughout the forefoot. Electronically Signed   By: Jacqulynn Cadet M.D.   On: 12/31/2020 08:48      Assessment/Plan 1.  PAD with ulceration left foot.  We will try to get an angiogram done today.  Has podiatry surgery scheduled for Friday.  Risks and benefits were discussed and they are agreeable to proceed. 2.  Lower extremity swelling with recurrent ulcerations.  Has been managed with Unna boots and this is working pretty well. 3.  Diabetes. blood glucose control important in reducing the progression of atherosclerotic disease. Also, involved in wound healing. On appropriate medications.    [name], MD  01/04/2021 5:20 PM    This note was created with Dragon medical transcription system.  Any error is purely unintentional

## 2021-01-04 NOTE — Progress Notes (Addendum)
PROGRESS NOTE    Mahesh Lennox Solders.  JR:[id number] DOB: May 20, 1955 DOA: 01/03/2021 PCP: [name], MD   Brief Narrative: Taken from H&P. Quaid A Gustin Ekdahl. is a 65 y.o. male with medical history significant of hypertension, heart failure, hyperlipidemia, diabetes presents with fever and chills.  Patient had a chronic ulcer beneath the fifth metatarsal of left foot, blood cultures growing Streptococcus.  MRI negative for osteomyelitis. Patient going for angiography later today.  Subjective: Patient was seen and examined today.  Wife at bedside.  Patient is struggling with left foot nonhealing ulcer for the past 63-month he was referred to wound care center by his provider.  Assessment & Plan:   Principal Problem:   Sepsis ([facility] Active Problems:   Diabetes mellitus type 2 in obese (HCC)   CAD (coronary artery disease)   CAD (coronary artery disease), autologous vein bypass graft   Pure hypercholesterolemia   RLS (restless legs syndrome)   Chronic osteomyelitis of left foot (HCC)   Osteomyelitis (HCC)  Sepsis/strep bacteremia/left foot cellulitis and chronic ulcer.  MRI without signs of osteomyelitis but did show extensive cellulitis.  Preliminary blood cultures growing strep. Patient with chronic ulcers, history of third ray amputation years before.  Current ulcer of about 450to 56-monthld at the base of fifth metatarsal, patient follow-up with podiatry and wound clinic. Initially started on vancomycin and Zosyn.   Podiatry was consulted-patient underwent debridement. Going for angiography later today. Potential fifth ray amputation on Friday. -Switch antibiotics to ceftriaxone. -Echocardiogram-if negative will need TEE. -ID consult  Type 2 diabetes mellitus.  A1c of 8.9.  CBG elevated. -Add Lantus 15 units twice daily-he was using 25 units twice daily at home. -Add 4 units with meals -Continue with SSI.  History of CAD.  No chest pain. -Continue home dose of  Plavix and isosorbide. -Continue with statin  Hypertension.  Blood pressure within goal. -Continue with losartan  BPH. -Continue with Flomax  Objective: Vitals:   01/04/21 0500 01/04/21 0828 01/04/21 1159 01/04/21 1652  BP:  116/62 116/62 (!) 135/59  Pulse:  (!) 106 86 79  Resp:  '18 17 18  '$ Temp:  99.4 F (37.4 C) 98.4 F (36.9 C) 98.1 F (36.7 C)  TempSrc:      SpO2:  95% 97% 99%  Weight: (!) 152 kg     Height:        Intake/Output Summary (Last 24 hours) at 01/04/2021 1701 Last data filed at 01/04/2021 1551 Gross per 24 hour  Intake 1899.84 ml  Output 700 ml  Net 1199.84 ml   Filed Weights   01/03/21 1711 01/04/21 0500  Weight: (!) 152 kg (!) 152 kg    Examination:  General exam: Appears calm and comfortable  Respiratory system: Clear to auscultation. Respiratory effort normal. Cardiovascular system: S1 & S2 heard, RRR.  Gastrointestinal system: Soft, nontender, nondistended, bowel sounds positive. Central nervous system: Alert and oriented. No focal neurological deficits. Extremities: No edema, no cyanosis, pulses intact and symmetrical.  Left foot with clean bandage. Psychiatry: Judgement and insight appear normal.    DVT prophylaxis: Eliquis Code Status: Full Family Communication: Discussed with wife at bedside Disposition Plan:  Status is: Inpatient  Remains inpatient appropriate because:Inpatient level of care appropriate due to severity of illness  Dispo: The patient is from: Home              Anticipated d/c is to: Home  Patient currently is not medically stable to d/c.   Difficult to place patient No              Level of care: Progressive Cardiac  All the records are reviewed and case discussed with Care Management/Social Worker. Management plans discussed with the patient, nursing and they are in agreement.  Consultants:  Podiatry  Procedures:  Antimicrobials:  Ceftriaxone  Data Reviewed: I have personally reviewed following  labs and imaging studies  CBC: Recent Labs  Lab 01/03/21 1726 01/04/21 0638  WBC 11.4* 12.0*  NEUTROABS 10.5* 11.2*  HGB 14.6 13.4  HCT 43.7 40.7  MCV 91.2 91.1  PLT 150 123456*   Basic Metabolic Panel: Recent Labs  Lab 01/03/21 1726 01/04/21 0638  NA 135 134*  K 4.4 3.8  CL 99 100  CO2 27 26  GLUCOSE 269* 317*  BUN 21 21  CREATININE 0.85 0.98  CALCIUM 8.8* 8.5*   GFR: Estimated Creatinine Clearance: 120 mL/min (by C-G formula based on SCr of 0.98 mg/dL). Liver Function Tests: Recent Labs  Lab 01/03/21 1726 01/04/21 0638  AST 18 12*  ALT 16 11  ALKPHOS 103 87  BILITOT 0.6 0.7  PROT 7.3 6.5  ALBUMIN 3.4* 2.9*   No results for input(s): LIPASE, AMYLASE in the last 168 hours. No results for input(s): AMMONIA in the last 168 hours. Coagulation Profile: Recent Labs  Lab 01/03/21 1726  INR 1.0   Cardiac Enzymes: No results for input(s): CKTOTAL, CKMB, CKMBINDEX, TROPONINI in the last 168 hours. BNP (last 3 results) No results for input(s): PROBNP in the last 8760 hours. HbA1C: Recent Labs    01/04/21 0638  HGBA1C 8.9*   CBG: Recent Labs  Lab 01/03/21 2301 01/04/21 0827 01/04/21 1157 01/04/21 1651  GLUCAP 291* 355* 305* 254*   Lipid Profile: No results for input(s): CHOL, HDL, LDLCALC, TRIG, CHOLHDL, LDLDIRECT in the last 72 hours. Thyroid Function Tests: No results for input(s): TSH, T4TOTAL, FREET4, T3FREE, THYROIDAB in the last 72 hours. Anemia Panel: No results for input(s): VITAMINB12, FOLATE, FERRITIN, TIBC, IRON, RETICCTPCT in the last 72 hours. Sepsis Labs: Recent Labs  Lab 01/03/21 1718 01/03/21 1927  LATICACIDVEN 1.7 1.1    Recent Results (from the past 240 hour(s))  Blood culture (routine single)     Status: None (Preliminary result)   Collection Time: 01/03/21  5:37 PM   Specimen: BLOOD  Result Value Ref Range Status   Specimen Description   Final    BLOOD BLOOD RIGHT FOREARM Performed at [facility], [address], [place], [place] [address]    Special Requests   Final    BOTTLES DRAWN AEROBIC AND ANAEROBIC Blood Culture adequate volume Performed at [facility], [address] [place], [place] [address]    Culture  Setup Time   Final    GRAM POSITIVE COCCI AEROBIC BOTTLE ONLY CRITICAL RESULT CALLED TO, READ BACK BY AND VERIFIED WITH: Coralie Carpen 01/04/21 [place] Performed at [facility], [place] [address], [place],  [address]    Culture GRAM POSITIVE COCCI  Final   Report Status PENDING  Incomplete  Blood Culture ID Panel (Reflexed)     Status: Abnormal   Collection Time: 01/03/21  5:37 PM  Result Value Ref Range Status   Enterococcus faecalis NOT DETECTED NOT DETECTED Final   Enterococcus Faecium NOT DETECTED NOT DETECTED Final   Listeria monocytogenes NOT DETECTED NOT DETECTED Final   Staphylococcus species NOT DETECTED NOT DETECTED  Final   Staphylococcus aureus (BCID) NOT DETECTED NOT DETECTED Final   Staphylococcus epidermidis NOT DETECTED NOT DETECTED Final   Staphylococcus lugdunensis NOT DETECTED NOT DETECTED Final   Streptococcus species DETECTED (A) NOT DETECTED Final    Comment: Not Enterococcus species, Streptococcus agalactiae, Streptococcus pyogenes, or Streptococcus pneumoniae. CRITICAL RESULT CALLED TO, READ BACK BY AND VERIFIED WITH: SAMANTHA RAUER 01/04/21 0749 KLW    Streptococcus agalactiae NOT DETECTED NOT DETECTED Final   Streptococcus pneumoniae NOT DETECTED NOT DETECTED Final   Streptococcus pyogenes NOT DETECTED NOT DETECTED Final   A.calcoaceticus-baumannii NOT DETECTED NOT DETECTED Final   Bacteroides fragilis NOT DETECTED NOT DETECTED Final   Enterobacterales NOT DETECTED NOT DETECTED Final   Enterobacter cloacae complex NOT DETECTED NOT DETECTED Final   Escherichia coli NOT DETECTED NOT DETECTED Final   Klebsiella aerogenes NOT DETECTED NOT DETECTED Final   Klebsiella oxytoca NOT DETECTED NOT DETECTED Final   Klebsiella pneumoniae  NOT DETECTED NOT DETECTED Final   Proteus species NOT DETECTED NOT DETECTED Final   Salmonella species NOT DETECTED NOT DETECTED Final   Serratia marcescens NOT DETECTED NOT DETECTED Final   Haemophilus influenzae NOT DETECTED NOT DETECTED Final   Neisseria meningitidis NOT DETECTED NOT DETECTED Final   Pseudomonas aeruginosa NOT DETECTED NOT DETECTED Final   Stenotrophomonas maltophilia NOT DETECTED NOT DETECTED Final   Candida albicans NOT DETECTED NOT DETECTED Final   Candida auris NOT DETECTED NOT DETECTED Final   Candida glabrata NOT DETECTED NOT DETECTED Final   Candida krusei NOT DETECTED NOT DETECTED Final   Candida parapsilosis NOT DETECTED NOT DETECTED Final   Candida tropicalis NOT DETECTED NOT DETECTED Final   Cryptococcus neoformans/gattii NOT DETECTED NOT DETECTED Final    Comment: Performed at [facility], [address] [place], [place] [address]  Resp Panel by RT-PCR (Flu A&B, Covid) Nasopharyngeal Swab     Status: None   Collection Time: 01/03/21  6:24 PM   Specimen: Nasopharyngeal Swab; Nasopharyngeal(NP) swabs in vial transport medium  Result Value Ref Range Status   SARS Coronavirus 2 by RT PCR NEGATIVE NEGATIVE Final    Comment: (NOTE) SARS-CoV-2 target nucleic acids are NOT DETECTED.  The SARS-CoV-2 RNA is generally detectable in upper respiratory specimens during the acute phase of infection. The lowest concentration of SARS-CoV-2 viral copies this assay can detect is 138 copies/mL. A negative result does not preclude SARS-Cov-2 infection and should not be used as the sole basis for treatment or other patient management decisions. A negative result may occur with  improper specimen collection/handling, submission of specimen other than nasopharyngeal swab, presence of viral mutation(s) within the areas targeted by this assay, and inadequate number of viral copies(<138 copies/mL). A negative result must be combined with clinical observations,  patient history, and epidemiological information. The expected result is Negative.  Fact Sheet for Patients:  EntrepreneurPulse.com.au  Fact Sheet for Healthcare Providers:  IncredibleEmployment.be  This test is no t yet approved or cleared by the [place] FDA and  has been authorized for detection and/or diagnosis of SARS-CoV-2 by FDA under an Emergency Use Authorization (EUA). This EUA will remain  in effect (meaning this test can be used) for the duration of the COVID-19 declaration under Section 564(b)(1) of the Act, 21 U.S.C.section 360bbb-3(b)(1), unless the authorization is terminated  or revoked sooner.       Influenza A by PCR NEGATIVE NEGATIVE Final   Influenza B by PCR NEGATIVE NEGATIVE Final    Comment: (NOTE) The Xpert Xpress  SARS-CoV-2/FLU/RSV plus assay is intended as an aid in the diagnosis of influenza from Nasopharyngeal swab specimens and should not be used as a sole basis for treatment. Nasal washings and aspirates are unacceptable for Xpert Xpress SARS-CoV-2/FLU/RSV testing.  Fact Sheet for Patients: EntrepreneurPulse.com.au  Fact Sheet for Healthcare Providers: IncredibleEmployment.be  This test is not yet approved or cleared by the [place] FDA and has been authorized for detection and/or diagnosis of SARS-CoV-2 by FDA under an Emergency Use Authorization (EUA). This EUA will remain in effect (meaning this test can be used) for the duration of the COVID-19 declaration under Section 564(b)(1) of the Act, 21 U.S.C. section 360bbb-3(b)(1), unless the authorization is terminated or revoked.  Performed at [facility], [address] [place], [place] [address]   Blood culture (single)     Status: None (Preliminary result)   Collection Time: 01/03/21  7:15 PM   Specimen: BLOOD  Result Value Ref Range Status   Specimen Description BLOOD BLOOD LEFT HAND  Final    Special Requests   Final    BOTTLES DRAWN AEROBIC AND ANAEROBIC Blood Culture results may not be optimal due to an excessive volume of blood received in culture bottles   Culture  Setup Time   Final    GRAM POSITIVE COCCI ANAEROBIC BOTTLE ONLY CRITICAL RESULT CALLED TO, READ BACK BY AND VERIFIED WITH: Coralie Carpen 01/04/21 [place] Performed at [facility], [address] [place], [place] [address]    Culture GRAM POSITIVE COCCI  Final   Report Status PENDING  Incomplete  MRSA Next Gen by PCR, Nasal     Status: None   Collection Time: 01/04/21  7:52 AM   Specimen: Nasal Mucosa; Nasal Swab  Result Value Ref Range Status   MRSA by PCR Next Gen NOT DETECTED NOT DETECTED Final    Comment: (NOTE) The GeneXpert MRSA Assay (FDA approved for NASAL specimens only), is one component of a comprehensive MRSA colonization surveillance program. It is not intended to diagnose MRSA infection nor to guide or monitor treatment for MRSA infections. Test performance is not FDA approved in patients less than 44 years old. Performed at [facility], [address] [place], [place] [address]      Radiology Studies: MR FOOT LEFT WO CONTRAST  Result Date: 01/04/2021 CLINICAL DATA:  Diabetic foot ulcer. EXAM: MRI OF THE LEFT FOOT WITHOUT CONTRAST TECHNIQUE: Multiplanar, multisequence MR imaging of the left foot was performed. No intravenous contrast was administered. COMPARISON:  Radiographs 01/03/2021. MRI 02/23/2019 FINDINGS: There is a ulceration noted along the plantar lateral aspect of the forefoot near the level of the fifth metatarsal head. No underlying subcutaneous abscess is identified. Diffuse subcutaneous soft tissue swelling/edema/fluid suggesting cellulitis. No soft tissue abscesses are identified for certain without contrast. Diffuse myositis without findings for pyomyositis. Surgical changes related to a prior amputation involving the third digit and third distal metatarsal.  No findings suspicious septic arthritis or osteomyelitis. Chronic cystic type degenerative changes are noted in the bases of the third, fourth and fifth metatarsals. The major tendons and ligaments appear grossly intact. IMPRESSION: 1. Soft tissue ulceration along the plantar lateral aspect of the forefoot near the level of the fifth metatarsal head. No underlying subcutaneous abscess. 2. Diffuse cellulitis and myositis. 3. No findings for septic arthritis or osteomyelitis. Electronically Signed   By: Marijo Sanes M.D.   On: 01/04/2021 05:08   DG Chest Port 1 View  Result Date: 01/03/2021 CLINICAL DATA:  Sepsis.  Evaluate for infiltrate. EXAM: PORTABLE CHEST 1 VIEW COMPARISON:  01/06/2020 FINDINGS: Previous median sternotomy and CABG procedure. Stable cardiomediastinal contours. Pulmonary vascular congestion. No pleural effusion or edema. No airspace densities. IMPRESSION: Pulmonary vascular congestion. Electronically Signed   By: Kerby Moors M.D.   On: 01/03/2021 18:02   DG Foot Complete Left  Result Date: 01/03/2021 CLINICAL DATA:  Evaluate for osteomyelitis. EXAM: LEFT FOOT - COMPLETE 3+ VIEW COMPARISON:  12/28/2020 FINDINGS: Status post third ray amputation at the level of the head of the third metatarsal bone. Diffuse soft tissue swelling is identified. There is a focal bone erosion involving the lateral aspect of the head of the fifth metatarsal bone. This is a new finding compared with 02/25/2020, but appears similar to 12/28/20. No additional focal bone erosions. No fracture or dislocation. No radio-opaque foreign bodies. IMPRESSION: 1. Chronic bone erosion involving the lateral aspect of the head of the fifth metatarsal bone compatible with osteomyelitis. Unchanged from 12/28/2020 but new compared with 02/25/2020. 2. Diffuse soft tissue swelling. 3. Status post third ray amputation. Electronically Signed   By: Kerby Moors M.D.   On: 01/03/2021 19:51    Scheduled Meds:  apixaban  5 mg Oral BID    atorvastatin  20 mg Oral QHS   [START ON 01/05/2021] clopidogrel  75 mg Oral Daily   ezetimibe  10 mg Oral QHS   insulin aspart  0-15 Units Subcutaneous TID WC   insulin aspart  0-5 Units Subcutaneous QHS   insulin aspart  4 Units Subcutaneous TID WC   insulin glargine-yfgn  15 Units Subcutaneous BID   Continuous Infusions:  sodium chloride 75 mL/hr at 01/04/21 0927   cefTRIAXone (ROCEPHIN)  IV 2 g (01/04/21 1551)     LOS: 1 day   Time spent: 45 minutes. More than 50% of the time was spent in counseling/coordination of care  Lorella Nimrod, MD [facility]  If 7PM-7AM, please contact night-coverage [email or web address]  01/04/2021, 5:01 PM   This record has been created using Systems analyst. Errors have been sought and corrected,but may not always be located. Such creation errors do not reflect on the standard of care.

## 2021-01-04 NOTE — Progress Notes (Signed)
*  PRELIMINARY RESULTS* Echocardiogram 2D Echocardiogram has been performed.  [place] 01/04/2021, 8:57 AM

## 2021-01-04 NOTE — Consult Note (Addendum)
[facility] Nurse Consult Note: Reason for Consult: Consult requested for left foot.  Pt has been followed by the outpatient wound care center prior to admission and also by Podiatry;  they have a consult pending. MRI indicates: "Soft tissue ulceration along the plantar lateral aspect of the forefoot near the level of the fifth metatarsal head. No underlying subcutaneous abscess. 2. Diffuse cellulitis and myositis. 3. No findings for septic arthritis or osteomyelitis. Wound type: Inner toe space with dry intact scar tissue from previous surgical wound which has healed with intact skin.  Left outer plantar foot with chronic full thickness wound; .2X.2X.2cm, dry dark brown wound bed, no odor, drainage, or fluctuance.  Dressing procedure/placement/frequency: Topical treatment orders provided for bedside nurses to perform until further recommendations are available from the podiatry team: Apply a piece of Aquacel Kellie Simmering # [phone]) to left foot wound Q day, then cover with foam dressing.  (Change foam dressing Q 3 days or PRN soiling.) Tuck 2X2 gauze in between toe space where pt has scar tissue Q day. Please re-consult if further assistance is needed.  Thank-you,  Julien Girt MSN, [place], [place], [place], [place]

## 2021-01-05 ENCOUNTER — Inpatient Hospital Stay: Payer: Medicare Other | Admitting: Certified Registered Nurse Anesthetist

## 2021-01-05 ENCOUNTER — Encounter: Payer: Self-pay | Admitting: Vascular Surgery

## 2021-01-05 DIAGNOSIS — L97509 Non-pressure chronic ulcer of other part of unspecified foot with unspecified severity: Secondary | ICD-10-CM

## 2021-01-05 DIAGNOSIS — E1169 Type 2 diabetes mellitus with other specified complication: Secondary | ICD-10-CM | POA: Diagnosis not present

## 2021-01-05 DIAGNOSIS — A419 Sepsis, unspecified organism: Secondary | ICD-10-CM | POA: Diagnosis not present

## 2021-01-05 DIAGNOSIS — E11621 Type 2 diabetes mellitus with foot ulcer: Secondary | ICD-10-CM | POA: Diagnosis not present

## 2021-01-05 DIAGNOSIS — L0390 Cellulitis, unspecified: Secondary | ICD-10-CM | POA: Diagnosis not present

## 2021-01-05 DIAGNOSIS — R7881 Bacteremia: Secondary | ICD-10-CM | POA: Diagnosis not present

## 2021-01-05 DIAGNOSIS — B955 Unspecified streptococcus as the cause of diseases classified elsewhere: Secondary | ICD-10-CM

## 2021-01-05 DIAGNOSIS — I2510 Atherosclerotic heart disease of native coronary artery without angina pectoris: Secondary | ICD-10-CM | POA: Diagnosis not present

## 2021-01-05 LAB — ECHOCARDIOGRAM COMPLETE
AR max vel: 3.82 cm2
AV Area VTI: 4.85 cm2
AV Area mean vel: 3.46 cm2
AV Mean grad: 10 mmHg
AV Peak grad: 16.6 mmHg
Ao pk vel: 2.04 m/s
Area-P 1/2: 5.97 cm2
Height: 76 in
MV VTI: 3.92 cm2
S' Lateral: 4.3 cm
Weight: 5360 oz

## 2021-01-05 LAB — COMPREHENSIVE METABOLIC PANEL
ALT: 13 U/L (ref 0–44)
AST: 13 U/L — ABNORMAL LOW (ref 15–41)
Albumin: 2.8 g/dL — ABNORMAL LOW (ref 3.5–5.0)
Alkaline Phosphatase: 78 U/L (ref 38–126)
Anion gap: 8 (ref 5–15)
BUN: 19 mg/dL (ref 8–23)
CO2: 27 mmol/L (ref 22–32)
Calcium: 8.1 mg/dL — ABNORMAL LOW (ref 8.9–10.3)
Chloride: 100 mmol/L (ref 98–111)
Creatinine, Ser: 0.97 mg/dL (ref 0.61–1.24)
GFR, Estimated: >60 mL/min (ref >60)
Glucose, Bld: 325 mg/dL — ABNORMAL HIGH (ref 70–99)
Potassium: 4.1 mmol/L (ref 3.5–5.1)
Sodium: 135 mmol/L (ref 135–145)
Total Bilirubin: 0.5 mg/dL (ref 0.3–1.2)
Total Protein: 6.4 g/dL — ABNORMAL LOW (ref 6.5–8.1)

## 2021-01-05 LAB — CBC WITH DIFFERENTIAL/PLATELET
Abs Immature Granulocytes: 0.03 10*3/uL (ref 0.00–0.07)
Basophils Absolute: 0 10*3/uL (ref 0.0–0.1)
Basophils Relative: 1 %
Eosinophils Absolute: 0.3 10*3/uL (ref 0.0–0.5)
Eosinophils Relative: 3 %
HCT: 43.8 % (ref 39.0–52.0)
Hemoglobin: 14.2 g/dL (ref 13.0–17.0)
Immature Granulocytes: 0 %
Lymphocytes Relative: 13 %
Lymphs Abs: 0.9 10*3/uL (ref 0.7–4.0)
MCH: 30.3 pg (ref 26.0–34.0)
MCHC: 32.4 g/dL (ref 30.0–36.0)
MCV: 93.4 fL (ref 80.0–100.0)
Monocytes Absolute: 0.5 10*3/uL (ref 0.1–1.0)
Monocytes Relative: 6 %
Neutro Abs: 5.6 10*3/uL (ref 1.7–7.7)
Neutrophils Relative %: 77 %
Platelets: 143 10*3/uL — ABNORMAL LOW (ref 150–400)
RBC: 4.69 MIL/uL (ref 4.22–5.81)
RDW: 14.6 % (ref 11.5–15.5)
WBC: 7.3 10*3/uL (ref 4.0–10.5)
nRBC: 0 % (ref 0.0–0.2)

## 2021-01-05 LAB — GLUCOSE, CAPILLARY
Glucose-Capillary: 271 mg/dL — ABNORMAL HIGH (ref 70–99)
Glucose-Capillary: 351 mg/dL — ABNORMAL HIGH (ref 70–99)
Glucose-Capillary: 399 mg/dL — ABNORMAL HIGH (ref 70–99)
Glucose-Capillary: 389 mg/dL — ABNORMAL HIGH (ref 70–99)

## 2021-01-05 LAB — APTT: aPTT: 35 seconds (ref 24–36)

## 2021-01-05 LAB — PROTIME-INR
INR: 1.2 (ref 0.8–1.2)
Prothrombin Time: 15 seconds (ref 11.4–15.2)

## 2021-01-05 LAB — MAGNESIUM: Magnesium: 2 mg/dL (ref 1.7–2.4)

## 2021-01-05 MED ORDER — DILTIAZEM HCL 25 MG/5ML IV SOLN
5.0000 mg | Freq: Once | INTRAVENOUS | Status: AC
  Administered 2021-01-06: 5 mg via INTRAVENOUS
  Filled 2021-01-05: qty 5

## 2021-01-05 MED ORDER — AMIODARONE HCL IN DEXTROSE 360-4.14 MG/200ML-% IV SOLN
60.0000 mg/h | INTRAVENOUS | Status: AC
  Administered 2021-01-05 (×2): 60 mg/h via INTRAVENOUS
  Filled 2021-01-05 (×2): qty 200

## 2021-01-05 MED ORDER — METOPROLOL TARTRATE 50 MG PO TABS
50.0000 mg | ORAL_TABLET | Freq: Two times a day (BID) | ORAL | Status: DC
  Administered 2021-01-05 – 2021-01-06 (×3): 50 mg via ORAL
  Filled 2021-01-05 (×4): qty 1

## 2021-01-05 MED ORDER — TAMSULOSIN HCL 0.4 MG PO CAPS
0.4000 mg | ORAL_CAPSULE | Freq: Every day | ORAL | Status: DC
  Administered 2021-01-05 – 2021-01-19 (×15): 0.4 mg via ORAL
  Filled 2021-01-05 (×16): qty 1

## 2021-01-05 MED ORDER — AMIODARONE LOAD VIA INFUSION
150.0000 mg | Freq: Once | INTRAVENOUS | Status: AC
  Administered 2021-01-05: 150 mg via INTRAVENOUS
  Filled 2021-01-05: qty 83.34

## 2021-01-05 MED ORDER — DILTIAZEM HCL 25 MG/5ML IV SOLN
40.0000 mg | Freq: Once | INTRAVENOUS | Status: DC
  Filled 2021-01-05: qty 10

## 2021-01-05 MED ORDER — DILTIAZEM HCL-DEXTROSE 125-5 MG/125ML-% IV SOLN (PREMIX)
5.0000 mg/h | INTRAVENOUS | Status: DC
  Administered 2021-01-05: 5 mg/h via INTRAVENOUS
  Filled 2021-01-05: qty 125

## 2021-01-05 MED ORDER — CHLORHEXIDINE GLUCONATE 4 % EX LIQD: 60.0000 mL | Freq: Once | CUTANEOUS | Status: DC

## 2021-01-05 MED ORDER — DILTIAZEM LOAD VIA INFUSION
25.0000 mg | Freq: Once | INTRAVENOUS | Status: AC
  Administered 2021-01-05: 25 mg via INTRAVENOUS
  Filled 2021-01-05: qty 25

## 2021-01-05 MED ORDER — POVIDONE-IODINE 10 % EX SWAB: 2.0000 "application " | Freq: Once | CUTANEOUS | Status: DC

## 2021-01-05 MED ORDER — AMIODARONE HCL IN DEXTROSE 360-4.14 MG/200ML-% IV SOLN
30.0000 mg/h | INTRAVENOUS | Status: DC
  Administered 2021-01-05 – 2021-01-06 (×3): 30 mg/h via INTRAVENOUS
  Filled 2021-01-05 (×2): qty 200

## 2021-01-05 MED ORDER — METOPROLOL TARTRATE 5 MG/5ML IV SOLN
5.0000 mg | Freq: Once | INTRAVENOUS | Status: AC
  Administered 2021-01-05: 5 mg via INTRAVENOUS
  Filled 2021-01-05: qty 5

## 2021-01-05 MED ORDER — SODIUM CHLORIDE 0.9 % IV BOLUS
500.0000 mL | Freq: Once | INTRAVENOUS | Status: AC
  Administered 2021-01-05: 500 mL via INTRAVENOUS

## 2021-01-05 NOTE — Progress Notes (Signed)
1 Day Post-Op   Subjective/Chief Complaint: Patient seen.  States overall he is feeling well.  Denies any fever or chills or nausea.   Objective: Vital signs in last 24 hours: Temp:  [97.5 F (36.4 C)-98.7 F (37.1 C)] 97.7 F (36.5 C) (09/08 0700) Pulse Rate:  [67-151] 67 (09/08 0700) Resp:  [11-22] 20 (09/08 0700) BP: (92-146)/(55-97) 103/58 (09/08 0700) SpO2:  [91 %-100 %] 98 % (09/08 0700) Weight:  [152 kg-161.3 kg] 161.3 kg (09/08 0412) Last BM Date:  (PTA)  Intake/Output from previous day: 09/07 0701 - 09/08 0700 In: 1820.9 [P.O.:750; I.V.:970.7; IV Piggyback:100.2] Out: 2025 [Urine:2025] Intake/Output this shift: No intake/output data recorded.  Bandage on the foot is intact.  Lab Results:  Recent Labs    01/04/21 0638 01/05/21 0726  WBC 12.0* 7.3  HGB 13.4 14.2  HCT 40.7 43.8  PLT 148* 143*   BMET Recent Labs    01/04/21 0638 01/05/21 0726  NA 134* 135  K 3.8 4.1  CL 100 100  CO2 26 27  GLUCOSE 317* 325*  BUN 21 19  CREATININE 0.98 0.97  CALCIUM 8.5* 8.1*   PT/INR Recent Labs    01/03/21 1726 01/05/21 0726  LABPROT 13.3 15.0  INR 1.0 1.2   ABG Recent Labs    01/03/21 1809  HCO3 30.6*    Studies/Results: MR FOOT LEFT WO CONTRAST  Result Date: 01/04/2021 CLINICAL DATA:  Diabetic foot ulcer. EXAM: MRI OF THE LEFT FOOT WITHOUT CONTRAST TECHNIQUE: Multiplanar, multisequence MR imaging of the left foot was performed. No intravenous contrast was administered. COMPARISON:  Radiographs 01/03/2021. MRI 02/23/2019 FINDINGS: There is a ulceration noted along the plantar lateral aspect of the forefoot near the level of the fifth metatarsal head. No underlying subcutaneous abscess is identified. Diffuse subcutaneous soft tissue swelling/edema/fluid suggesting cellulitis. No soft tissue abscesses are identified for certain without contrast. Diffuse myositis without findings for pyomyositis. Surgical changes related to a prior amputation involving the  third digit and third distal metatarsal. No findings suspicious septic arthritis or osteomyelitis. Chronic cystic type degenerative changes are noted in the bases of the third, fourth and fifth metatarsals. The major tendons and ligaments appear grossly intact. IMPRESSION: 1. Soft tissue ulceration along the plantar lateral aspect of the forefoot near the level of the fifth metatarsal head. No underlying subcutaneous abscess. 2. Diffuse cellulitis and myositis. 3. No findings for septic arthritis or osteomyelitis. Electronically Signed   By: Marijo Sanes M.D.   On: 01/04/2021 05:08   PERIPHERAL VASCULAR CATHETERIZATION  Result Date: 01/04/2021 See surgical note for result.  DG Chest Port 1 View  Result Date: 01/03/2021 CLINICAL DATA:  Sepsis.  Evaluate for infiltrate. EXAM: PORTABLE CHEST 1 VIEW COMPARISON:  01/06/2020 FINDINGS: Previous median sternotomy and CABG procedure. Stable cardiomediastinal contours. Pulmonary vascular congestion. No pleural effusion or edema. No airspace densities. IMPRESSION: Pulmonary vascular congestion. Electronically Signed   By: Kerby Moors M.D.   On: 01/03/2021 18:02   DG Foot Complete Left  Result Date: 01/03/2021 CLINICAL DATA:  Evaluate for osteomyelitis. EXAM: LEFT FOOT - COMPLETE 3+ VIEW COMPARISON:  12/28/2020 FINDINGS: Status post third ray amputation at the level of the head of the third metatarsal bone. Diffuse soft tissue swelling is identified. There is a focal bone erosion involving the lateral aspect of the head of the fifth metatarsal bone. This is a new finding compared with 02/25/2020, but appears similar to 12/28/20. No additional focal bone erosions. No fracture or dislocation. No radio-opaque foreign bodies.  IMPRESSION: 1. Chronic bone erosion involving the lateral aspect of the head of the fifth metatarsal bone compatible with osteomyelitis. Unchanged from 12/28/2020 but new compared with 02/25/2020. 2. Diffuse soft tissue swelling. 3. Status post  third ray amputation. Electronically Signed   By: Kerby Moors M.D.   On: 01/03/2021 19:51    Anti-infectives: Anti-infectives (From admission, onward)    Start     Dose/Rate Route Frequency Ordered Stop   01/05/21 0600  ceFAZolin (ANCEF) IVPB 2g/100 mL premix        2 g 200 mL/hr over 30 Minutes Intravenous On call to O.R. 01/04/21 1711 01/05/21 0529   01/04/21 1400  cefTRIAXone (ROCEPHIN) 2 g in sodium chloride 0.9 % 100 mL IVPB        2 g 200 mL/hr over 30 Minutes Intravenous Every 24 hours 01/04/21 0853     01/04/21 1000  vancomycin (VANCOREADY) IVPB 1750 mg/350 mL  Status:  Discontinued        1,750 mg 175 mL/hr over 120 Minutes Intravenous Every 12 hours 01/03/21 2154 01/04/21 0757   01/04/21 1000  vancomycin (VANCOREADY) IVPB 1500 mg/300 mL  Status:  Discontinued        1,500 mg 150 mL/hr over 120 Minutes Intravenous Every 12 hours 01/04/21 0757 01/04/21 0853   01/03/21 2200  piperacillin-tazobactam (ZOSYN) IVPB 3.375 g  Status:  Discontinued        3.375 g 12.5 mL/hr over 240 Minutes Intravenous Every 8 hours 01/03/21 2154 01/04/21 0853   01/03/21 2100  vancomycin (VANCOREADY) IVPB 500 mg/100 mL        500 mg 100 mL/hr over 60 Minutes Intravenous  Once 01/03/21 1848 01/04/21 0019   01/03/21 1900  ceFEPIme (MAXIPIME) 2 g in sodium chloride 0.9 % 100 mL IVPB        2 g 200 mL/hr over 30 Minutes Intravenous  Once 01/03/21 1845 01/03/21 1925   01/03/21 1845  metroNIDAZOLE (FLAGYL) IVPB 500 mg  Status:  Discontinued        500 mg 100 mL/hr over 60 Minutes Intravenous Every 8 hours 01/03/21 1841 01/04/21 0853   01/03/21 1845  vancomycin (VANCOREADY) IVPB 2000 mg/400 mL        2,000 mg 200 mL/hr over 120 Minutes Intravenous  Once 01/03/21 1845 01/03/21 2248       Assessment/Plan: s/p Procedure(s): LOWER EXTREMITY ANGIOGRAPHY (Left) Assessment: Chronic ulceration left foot.  Poorly controlled diabetes with noncompliance.  Plan: Discussed with the patient that his surgery  with Dr. Luana Shu is scheduled for tomorrow.  Discussed removal of the bone beneath the chronic ulcerative area to hopefully allow for healing.  Orders will be placed by Dr. Luana Shu for consent and n.p.o. depending on what time his surgery is scheduled for tomorrow.  LOS: 2 days    Durward Fortes 01/05/2021

## 2021-01-05 NOTE — Consult Note (Signed)
NAME: [name].  DOB: 1955-10-22  MRN: [id number]  Date/Time: 01/05/2021 4:24 PM  REQUESTING PROVIDER: Dr.Amin Subjective:  REASON FOR CONSULT: bacteremia Wife at bedside who gives the history, chart reviewed- he is known to me from his previous foot infection in Oct 2021 [name]. is a 65 y.o. with a history of DM, HTN, HLD  CAD, CABG, CHF  left foot infection s/p third toe ray excision presented on 01/03/21 with near syncope,, sweating and chills confusion, of sudden onset . As per wife he was okay in the morning and had gone to the bathroom in the afternoon and she then found him in the front porch confused. She called EMS Pt has had swelling legs with blisters for a few weeks- followed at wound clinic- the coban dressing they apply gets soaking wet and they leave that for 2 days  recently saw cardiologist on 9/1 for leg swelling for which he was started on frusemide 64m daily He has chronic left foot ulceration for the past year and followed by podiatrist. In the ED temp 104,.3 HR 120 BP 162/74 WBC 11.4, plt 150, HB 14.6 and cr 0.85. Blood culture sent and h was started on IV vanco, cefepime and flagyl. As blood culture came positive for Group G strep it was changed to rocephin and I am asked to see the patient. He was seen by Dr.Baker who took culture from the wound and he is taking him for surgery tomorrow to remove the 5th met head. Angio done yesterday showed  Normal common femoral artery, profunda femoris artery, and mild disease in the superficial femoral and popliteal arteries without significant stenosis of greater than 30%.  There was a normal tibial trifurcation with occlusion of the anterior tibial artery proximally without good reconstitution distally.  There was a normal tibioperoneal trunk, peroneal artery, and a very large posterior tibial artery that filled the foot well with good perfusion and no focal stenosis.  Past Medical History:  Diagnosis Date    Asthma    Coronary artery disease    Depression    Di[place] mellitus [place] complication ([place]    Gout    History anabolic steroid use    Hyperlipidemia    Hypertension    Hypogonadism in male    Morbid obesity ([place]    Myocardial infarction ([place]    Peripheral vascular disease ([place]    Perirectal abscess    Pleurisy    Sleep apnea    CPAP at night, no oxygen   Varicella     Past Surgical History:  Procedure Laterality Date   ABDOMINAL AORTIC ANEURYSM REPAIR     AMPUTATION TOE Right 02/10/2016   Procedure: AMPUTATION TOE 3RD TOE;  Surgeon: JSamara Deist DPM;  Location: [facility];  Service: Podiatry;  Laterality: Right;   AMPUTATION TOE Left 02/24/2020   Procedure: AMPUTATION TOE;  Surgeon: BCaroline More DPM;  Location: [facility];  Service: Podiatry;  Laterality: Left;   APPLICATION OF WOUND VAC Left 02/29/2020   Procedure: APPLICATION OF WOUND VAC;  Surgeon: BCaroline More DPM;  Location: [facility];  Service: Podiatry;  Laterality: Left;   COLONOSCOPY WITH PROPOFOL [name] 11/18/2015   Procedure: COLONOSCOPY WITH PROPOFOL;  Surgeon: RManya Silvas MD;  [facility];  Service: Endoscopy;  Laterality: [name];   CORONARY ARTERY BYPASS GRAFT     CORONARY STENT INTERVENTION [name] 02/02/2020   Procedure: CORONARY STENT INTERVENTION;  Surgeon: PIsaias Cowman MD;  [facility];  Service: Cardiovascular;  Laterality: [name];   IRRIGATION AND DEBRIDEMENT FOOT Left 02/29/2020   Procedure: IRRIGATION AND DEBRIDEMENT FOOT;  Surgeon: Caroline More, DPM;  Location: [facility];  Service: Podiatry;  Laterality: Left;   IRRIGATION AND DEBRIDEMENT FOOT Left 02/24/2020   Procedure: IRRIGATION AND DEBRIDEMENT FOOT;  Surgeon: Caroline More, DPM;  Location: [facility];  Service: Podiatry;  Laterality: Left;   KNEE ARTHROSCOPY     LEFT HEART CATH AND CORS/GRAFTS ANGIOGRAPHY [name] 02/02/2020   Procedure: LEFT HEART CATH AND CORS/GRAFTS ANGIOGRAPHY;  Surgeon: Teodoro Spray, MD;   Location: [place] [facility];  Service: Cardiovascular;  Laterality: [name];   LOWER EXTREMITY ANGIOGRAPHY Left 02/25/2020   Procedure: Lower Extremity Angiography;  Surgeon: Algernon Huxley, MD;  Location: [place] [facility];  Service: Cardiovascular;  Laterality: Left;   LOWER EXTREMITY ANGIOGRAPHY Left 01/04/2021   Procedure: LOWER EXTREMITY ANGIOGRAPHY;  Surgeon: Algernon Huxley, MD;  Location: [place] [facility];  Service: Cardiovascular;  Laterality: Left;   PERIPHERAL VASCULAR CATHETERIZATION Right 01/24/2016   Procedure: Lower Extremity Angiography;  Surgeon: Katha Cabal, MD;  Location: [place] [facility];  Service: Cardiovascular;  Laterality: Right;   PERIPHERAL VASCULAR CATHETERIZATION Right 01/25/2016   Procedure: Lower Extremity Angiography;  Surgeon: Katha Cabal, MD;  Location: [place] [facility];  Service: Cardiovascular;  Laterality: Right;   TOE AMPUTATION     TONSILLECTOMY      Social History   Socioeconomic History   Marital status: Widowed    Spouse name: Not on file   Number of children: Not on file   Years of education: Not on file   Highest education level: Not on file  Occupational History   Not on file  Tobacco Use   Smoking status: Former    Packs/day: 0.50    Years: 45.00    Pack years: 22.50    Types: Cigarettes    Quit date: 04/05/2015    Years since quitting: 5.7   Smokeless tobacco: Never  Vaping Use   Vaping Use: Every day  Substance and Sexual Activity   Alcohol use: Not Currently    Alcohol/week: 3.0 standard drinks    Types: 3 Glasses of wine per week   Drug use: No   Sexual activity: Not on file  Other Topics Concern   Not on file  Social History Narrative   Not on file   Social Determinants of Health   Financial Resource Strain: Not on file  Food Insecurity: Not on file  Transportation Needs: Not on file  Physical Activity: Not on file  Stress: Not on file  Social Connections: Not on file  Intimate Partner Violence: Not  on file    FH CAD Father Hypertension Father CVA Father Alcohol abuse Father Asthma mother Diabetes mellitus mother Colon cancer mother Crohn's disease brother ?  Allergies  Allergen Reactions   Statins     Other reaction(s): Muscle Pain Causes legs to ache per pt   I? Current Facility-Administered Medications  Medication Dose Route Frequency Provider Last Rate Last Admin   acetaminophen (TYLENOL) tablet 650 mg  650 mg Oral Q6H PRN Algernon Huxley, MD   650 mg at 01/04/21 2223   Or   acetaminophen (TYLENOL) suppository 650 mg  650 mg Rectal Q6H PRN Algernon Huxley, MD       amiodarone (NEXTERONE PREMIX) 360-4.14 MG/200ML-% (1.8 mg/mL) IV infusion  30 mg/hr Intravenous Continuous Sharion Settler, NP 16.67 mL/hr at 01/05/21 1356 30 mg/hr  at 01/05/21 1356   apixaban (ELIQUIS) tablet 5 mg  5 mg Oral BID Algernon Huxley, MD   5 mg at 01/05/21 9379   atorvastatin (LIPITOR) tablet 20 mg  20 mg Oral QHS Algernon Huxley, MD   20 mg at 01/04/21 2215   cefTRIAXone (ROCEPHIN) 2 g in sodium chloride 0.9 % 100 mL IVPB  2 g Intravenous Q24H Algernon Huxley, MD 200 mL/hr at 01/04/21 1551 2 g at 01/04/21 1551   chlorhexidine (HIBICLENS) 4 % liquid 4 application  60 mL Topical Once Caroline More, DPM       clopidogrel (PLAVIX) tablet 75 mg  75 mg Oral Daily Algernon Huxley, MD   75 mg at 01/05/21 0853   diltiazem (CARDIZEM) 125 mg in dextrose 5% 125 mL (1 mg/mL) infusion  5-15 mg/hr Intravenous Continuous Sharion Settler, NP 5 mL/hr at 01/05/21 0539 5 mg/hr at 01/05/21 0539   diltiazem (CARDIZEM) injection 40 mg  40 mg Intravenous Once Sharion Settler, NP       ezetimibe (ZETIA) tablet 10 mg  10 mg Oral QHS Algernon Huxley, MD   10 mg at 01/04/21 2215   insulin aspart (novoLOG) injection 0-15 Units  0-15 Units Subcutaneous TID WC Algernon Huxley, MD   15 Units at 01/05/21 1217   insulin aspart (novoLOG) injection 0-5 Units  0-5 Units Subcutaneous QHS Algernon Huxley, MD   4 Units at 01/04/21 2305   insulin aspart  (novoLOG) injection 4 Units  4 Units Subcutaneous TID WC Algernon Huxley, MD   4 Units at 01/05/21 1218   insulin glargine-yfgn (SEMGLEE) injection 15 Units  15 Units Subcutaneous BID Algernon Huxley, MD   15 Units at 01/05/21 0853   metoprolol tartrate (LOPRESSOR) tablet 50 mg  50 mg Oral BID Sharion Settler, NP   50 mg at 01/05/21 0853   ondansetron (ZOFRAN) tablet 4 mg  4 mg Oral Q6H PRN Algernon Huxley, MD       Or   ondansetron (ZOFRAN) injection 4 mg  4 mg Intravenous Q6H PRN Algernon Huxley, MD       povidone-iodine 10 % swab 2 application  2 application Topical Once Caroline More, DPM       tamsulosin [facility]) capsule 0.4 mg  0.4 mg Oral Daily Sharion Settler, NP   0.4 mg at 01/05/21 0240   Facility-Administered Medications Ordered in Other Encounters  Medication Dose Route Frequency Provider Last Rate Last Admin   sodium chloride flush (NS) 0.9 % injection 3 mL  3 mL Intravenous Q12H Teodoro Spray, MD         Abtx:  Anti-infectives (From admission, onward)    Start     Dose/Rate Route Frequency Ordered Stop   01/05/21 0600  ceFAZolin (ANCEF) IVPB 2g/100 mL premix        2 g 200 mL/hr over 30 Minutes Intravenous On call to O.R. 01/04/21 1711 01/05/21 0529   01/04/21 1400  cefTRIAXone (ROCEPHIN) 2 g in sodium chloride 0.9 % 100 mL IVPB        2 g 200 mL/hr over 30 Minutes Intravenous Every 24 hours 01/04/21 0853     01/04/21 1000  vancomycin (VANCOREADY) IVPB 1750 mg/350 mL  Status:  Discontinued        1,750 mg 175 mL/hr over 120 Minutes Intravenous Every 12 hours 01/03/21 2154 01/04/21 0757   01/04/21 1000  vancomycin (VANCOREADY) IVPB 1500 mg/300 mL  Status:  Discontinued  1,500 mg 150 mL/hr over 120 Minutes Intravenous Every 12 hours 01/04/21 0757 01/04/21 0853   01/03/21 2200  piperacillin-tazobactam (ZOSYN) IVPB 3.375 g  Status:  Discontinued        3.375 g 12.5 mL/hr over 240 Minutes Intravenous Every 8 hours 01/03/21 2154 01/04/21 0853   01/03/21 2100  vancomycin  (VANCOREADY) IVPB 500 mg/100 mL        500 mg 100 mL/hr over 60 Minutes Intravenous  Once 01/03/21 1848 01/04/21 0019   01/03/21 1900  ceFEPIme (MAXIPIME) 2 g in sodium chloride 0.9 % 100 mL IVPB        2 g 200 mL/hr over 30 Minutes Intravenous  Once 01/03/21 1845 01/03/21 1925   01/03/21 1845  metroNIDAZOLE (FLAGYL) IVPB 500 mg  Status:  Discontinued        500 mg 100 mL/hr over 60 Minutes Intravenous Every 8 hours 01/03/21 1841 01/04/21 0853   01/03/21 1845  vancomycin (VANCOREADY) IVPB 2000 mg/400 mL        2,000 mg 200 mL/hr over 120 Minutes Intravenous  Once 01/03/21 1845 01/03/21 2248       REVIEW OF SYSTEMS:  Const:  fever,  chills, negative weight loss Eyes: negative diplopia or visual changes, negative eye pain ENT: negative coryza, negative sore throat Resp: negative cough, hemoptysis, dyspnea Cards: negative for chest pain, palpitations, lower extremity edema GU: negative for frequency, dysuria and hematuria GI: Negative for abdominal pain, diarrhea, bleeding, constipation Skin: blisters legs Heme: negative for easy bruising and gum/nose bleeding MS: negative for myalgias, arthralgias, back pain and muscle weakness Neurolo:as above  Psych: negative for feelings of anxiety, depression  Endocrine: , diabetes Allergy/Immunology-as above Objective:  VITALS:  BP 127/80   Pulse 87   Temp 98.4 F (36.9 C)   Resp (!) 22   Ht _0  (1.93 m)   Wt (!) 161.3 kg   SpO2 95%   BMI 43.30 kg/m  PHYSICAL EXAM:  General: Alert, cooperative, no distress, appears stated age. Increased BMI Head: Normocephalic, without obvious abnormality, atraumatic. Eyes: Conjunctivae clear, anicteric sclerae. Pupils are equal ENT Nares normal. No drainage or sinus tenderness. Lips, mucosa, and tongue normal. No Thrush Neck: Supple, symmetrical, no adenopathy, thyroid: non tender no carotid bruit and no JVD. Back: No CVA tenderness. Lungs: Clear to auscultation bilaterally. No Wheezing or  Rhonchi. No rales. Heart: irregular, . Abdomen: Soft, non-tender,not distended. Bowel sounds normal. No masses Extremities: b/l edema legs- blisters -deroofed and scabbing Rt foot 2nd and 3rd toe amputated- left foot 3rd toe amputated- ulcer over the 5th met head on the plantar surface         Skin: No rashes or lesions. Or bruising Lymph: Cervical, supraclavicular normal. Neurologic: Grossly non-focal Pertinent Labs Lab Results CBC    Component Value Date/Time   WBC 7.3 01/05/2021 0726   RBC 4.69 01/05/2021 0726   HGB 14.2 01/05/2021 0726   HGB 14.7 07/12/2011 0919   HCT 43.8 01/05/2021 0726   HCT 45.4 07/12/2011 0919   PLT 143 (L) 01/05/2021 0726   PLT 156 07/12/2011 0919   MCV 93.4 01/05/2021 0726   MCV 96 07/12/2011 0919   MCH 30.3 01/05/2021 0726   MCHC 32.4 01/05/2021 0726   RDW 14.6 01/05/2021 0726   RDW 15.2 (H) 07/12/2011 0919   LYMPHSABS 0.9 01/05/2021 0726   LYMPHSABS 1.2 07/12/2011 0919   MONOABS 0.5 01/05/2021 0726   MONOABS 0.8 (H) 07/12/2011 0919   EOSABS 0.3 01/05/2021 0726  EOSABS 0.4 07/12/2011 0919   BASOSABS 0.0 01/05/2021 0726   BASOSABS 0.1 07/12/2011 0919    CMP Latest Ref Rng & Units 01/05/2021 01/04/2021 01/03/2021  Glucose 70 - 99 mg/dL 325(H) 317(H) 269(H)  BUN 8 - 23 mg/dL _0 Creatinine 0.61 - 1.24 mg/dL 0.97 0.98 0.85  Sodium 135 - 145 mmol/L 135 134(L) 135  Potassium 3.5 - 5.1 mmol/L 4.1 3.8 4.4  Chloride 98 - 111 mmol/L 100 100 99  CO2 22 - 32 mmol/L _1 Calcium 8.9 - 10.3 mg/dL 8.1(L) 8.5(L) 8.8(L)  Total Protein 6.5 - 8.1 g/dL 6.4(L) 6.5 7.3  Total Bilirubin 0.3 - 1.2 mg/dL 0.5 0.7 0.6  Alkaline Phos 38 - 126 U/L 78 87 103  AST 15 - 41 U/L 13(L) 12(L) 18  ALT 0 - 44 U/L _2 Microbiology: Recent Results (from the past 240 hour(s))  Blood culture (routine single)     Status: Abnormal (Preliminary result)   Collection Time: 01/03/21  5:37 PM   Specimen: BLOOD  Result Value Ref Range Status   Specimen  Description   Final    BLOOD BLOOD RIGHT FOREARM Performed at [facility], [address] [place], [place] [address]    Special Requests   Final    BOTTLES DRAWN AEROBIC AND ANAEROBIC Blood Culture adequate volume Performed at [facility], [address] [place], [place] [address]    Culture  Setup Time   Final    GRAM POSITIVE COCCI AEROBIC BOTTLE ONLY CRITICAL RESULT CALLED TO, READ BACK BY AND VERIFIED WITH: Aldona Bar RAUER 01/04/21 0749 KLW    Culture (A)  Final    STREPTOCOCCUS GROUP G SUSCEPTIBILITIES TO FOLLOW Performed at [facility], [place] [address] [place], [place] [address]    Report Status PENDING  Incomplete  Blood Culture ID Panel (Reflexed)     Status: Abnormal   Collection Time: 01/03/21  5:37 PM  Result Value Ref Range Status   Enterococcus faecalis NOT DETECTED NOT DETECTED Final   Enterococcus Faecium NOT DETECTED NOT DETECTED Final   Listeria monocytogenes NOT DETECTED NOT DETECTED Final   Staphylococcus species NOT DETECTED NOT DETECTED Final   Staphylococcus aureus (BCID) NOT DETECTED NOT DETECTED Final   Staphylococcus epidermidis NOT DETECTED NOT DETECTED Final   Staphylococcus lugdunensis NOT DETECTED NOT DETECTED Final   Streptococcus species DETECTED (A) NOT DETECTED Final    Comment: Not Enterococcus species, Streptococcus agalactiae, Streptococcus pyogenes, or Streptococcus pneumoniae. CRITICAL RESULT CALLED TO, READ BACK BY AND VERIFIED WITH: SAMANTHA RAUER 01/04/21 0749 KLW    Streptococcus agalactiae NOT DETECTED NOT DETECTED Final   Streptococcus pneumoniae NOT DETECTED NOT DETECTED Final   Streptococcus pyogenes NOT DETECTED NOT DETECTED Final   A.calcoaceticus-baumannii NOT DETECTED NOT DETECTED Final   Bacteroides fragilis NOT DETECTED NOT DETECTED Final   Enterobacterales NOT DETECTED NOT DETECTED Final   Enterobacter cloacae complex NOT DETECTED NOT DETECTED Final   Escherichia coli NOT DETECTED NOT DETECTED  Final   Klebsiella aerogenes NOT DETECTED NOT DETECTED Final   Klebsiella oxytoca NOT DETECTED NOT DETECTED Final   Klebsiella pneumoniae NOT DETECTED NOT DETECTED Final   Proteus species NOT DETECTED NOT DETECTED Final   Salmonella species NOT DETECTED NOT DETECTED Final   Serratia marcescens NOT DETECTED NOT DETECTED Final   Haemophilus influenzae NOT DETECTED NOT DETECTED Final   Neisseria meningitidis NOT DETECTED NOT DETECTED Final   Pseudomonas aeruginosa NOT  DETECTED NOT DETECTED Final   Stenotrophomonas maltophilia NOT DETECTED NOT DETECTED Final   Candida albicans NOT DETECTED NOT DETECTED Final   Candida auris NOT DETECTED NOT DETECTED Final   Candida glabrata NOT DETECTED NOT DETECTED Final   Candida krusei NOT DETECTED NOT DETECTED Final   Candida parapsilosis NOT DETECTED NOT DETECTED Final   Candida tropicalis NOT DETECTED NOT DETECTED Final   Cryptococcus neoformans/gattii NOT DETECTED NOT DETECTED Final    Comment: Performed at [facility], [address] [place], [place] [address]  Resp Panel by RT-PCR (Flu A&B, Covid) Nasopharyngeal Swab     Status: None   Collection Time: 01/03/21  6:24 PM   Specimen: Nasopharyngeal Swab; Nasopharyngeal(NP) swabs in vial transport medium  Result Value Ref Range Status   SARS Coronavirus 2 by RT PCR NEGATIVE NEGATIVE Final    Comment: (NOTE) SARS-CoV-2 target nucleic acids are NOT DETECTED.  The SARS-CoV-2 RNA is generally detectable in upper respiratory specimens during the acute phase of infection. The lowest concentration of SARS-CoV-2 viral copies this assay can detect is 138 copies/mL. A negative result does not preclude SARS-Cov-2 infection and should not be used as the sole basis for treatment or other patient management decisions. A negative result may occur with  improper specimen collection/handling, submission of specimen other than nasopharyngeal swab, presence of viral mutation(s) within the areas targeted  by this assay, and inadequate number of viral copies(<138 copies/mL). A negative result must be combined with clinical observations, patient history, and epidemiological information. The expected result is Negative.  Fact Sheet for Patients:  EntrepreneurPulse.com.au  Fact Sheet for Healthcare Providers:  IncredibleEmployment.be  This test is no t yet approved or cleared by the [place] FDA and  has been authorized for detection and/or diagnosis of SARS-CoV-2 by FDA under an Emergency Use Authorization (EUA). This EUA will remain  in effect (meaning this test can be used) for the duration of the COVID-19 declaration under Section 564(b)(1) of the Act, 21 U.S.C.section 360bbb-3(b)(1), unless the authorization is terminated  or revoked sooner.       Influenza A by PCR NEGATIVE NEGATIVE Final   Influenza B by PCR NEGATIVE NEGATIVE Final    Comment: (NOTE) The Xpert Xpress SARS-CoV-2/FLU/RSV plus assay is intended as an aid in the diagnosis of influenza from Nasopharyngeal swab specimens and should not be used as a sole basis for treatment. Nasal washings and aspirates are unacceptable for Xpert Xpress SARS-CoV-2/FLU/RSV testing.  Fact Sheet for Patients: EntrepreneurPulse.com.au  Fact Sheet for Healthcare Providers: IncredibleEmployment.be  This test is not yet approved or cleared by the [place] FDA and has been authorized for detection and/or diagnosis of SARS-CoV-2 by FDA under an Emergency Use Authorization (EUA). This EUA will remain in effect (meaning this test can be used) for the duration of the COVID-19 declaration under Section 564(b)(1) of the Act, 21 U.S.C. section 360bbb-3(b)(1), unless the authorization is terminated or revoked.  Performed at [facility] [address] [place], [place] [address]   Blood culture (single)     Status: Abnormal (Preliminary result)    Collection Time: 01/03/21  7:15 PM   Specimen: BLOOD  Result Value Ref Range Status   Specimen Description   Final    BLOOD BLOOD LEFT HAND Performed at [facility], [address] [place], [place] [address]    Special Requests   Final    BOTTLES DRAWN AEROBIC AND ANAEROBIC Blood Culture results may not be optimal due to an  excessive volume of blood received in culture bottles Performed at [facility], [address] [place], [place] [address]    Culture  Setup Time   Final    GRAM POSITIVE COCCI ANAEROBIC BOTTLE ONLY CRITICAL RESULT CALLED TO, READ BACK BY AND VERIFIED WITH: Coralie Carpen 01/04/21 0749 KLW Performed at [facility], [address] [place], [place] [address]    Culture STREPTOCOCCUS GROUP G (A)  Final   Report Status PENDING  Incomplete  MRSA Next Gen by PCR, Nasal     Status: None   Collection Time: 01/04/21  7:52 AM   Specimen: Nasal Mucosa; Nasal Swab  Result Value Ref Range Status   MRSA by PCR Next Gen NOT DETECTED NOT DETECTED Final    Comment: (NOTE) The GeneXpert MRSA Assay (FDA approved for NASAL specimens only), is one component of a comprehensive MRSA colonization surveillance program. It is not intended to diagnose MRSA infection nor to guide or monitor treatment for MRSA infections. Test performance is not FDA approved in patients Wood than 66 years old. Performed at [facility], [address] [place], [place] [address]   Aerobic/Anaerobic Culture w Gram Stain (surgical/deep wound)     Status: None (Preliminary result)   Collection Time: 01/04/21 12:39 PM   Specimen: Foot; Wound  Result Value Ref Range Status   Specimen Description   Final    FOOT LEFT Performed at [facility], [address] [place], [place] [address]    Special Requests   Final    NONE Performed at [facility], [place], [place] [address]    Gram Stain   Final    FEW SQUAMOUS EPITHELIAL  CELLS PRESENT FEW WBC SEEN MODERATE GRAM POSITIVE COCCI FEW GRAM POSITIVE RODS    Culture   Final    CULTURE REINCUBATED FOR BETTER GROWTH Performed at [facility], [place] [address] [place], [place] [address]    Report Status PENDING  Incomplete    IMAGING RESULTS: MRI foot Soft tissue ulceration along the plantar lateral aspect of the forefoot near the level of the fifth metatarsal head. No underlying subcutaneous abscess. 2. Diffuse cellulitis and myositis. 3. No findings for septic arthritis or osteomyelitis. I have personally reviewed the films ? Impression/Recommendation Group G streptococcus bacteremia due B/l venous edema with blistering and superficial wounds and possible cellulitis Chronic left foot ulcer On ceftriaxone Would de-escalate to cefazolin or unasyn  No need for TEE as low risk for endocarditis due to acute onset, known source and no cardiac hardware  DM on insulin  Afib- started amiodarone  CAD s/p CBG   Discussed the management with the patient and his wife at bedside ? __ Note:  This document was prepared using Dragon voice recognition software and may include unintentional dictation errors.

## 2021-01-05 NOTE — Consult Note (Signed)
CARDIOLOGY CONSULT NOTE               Patient ID: [name]. MRN: [id number] DOB/AGE: Sep 05, 1955 65 y.o.  Admit date: 01/03/2021 Referring Physician Dr Lorella Nimrod hospitalist Primary Physician Dr. Doy Hutching Primary Cardiologist Dr. Jordan Hawks Reason for Consultation rule out SBE  HPI: Patient is a 65 year old obese white male hypertension heart failure hyperlipidemia diabetes with diabetic foot ulcer nonhealing osteomyelitis with fever and chills.  Patient before come to emergency room was found to be confused by his wife with some dyspnea patient sent by bilateral chronic nonhealing wounds of his legs he has had amputations before but with persistent symptoms patient was admitted and cardiology consultation was recommended.  Patient was admitted and placed on broad-spectrum antibiotic therapy cardiology consultation was recommended for possible TEE to rule out SBE.`  Review of systems complete and found to be negative unless listed above     Past Medical History:  Diagnosis Date   Asthma    Coronary artery disease    Depression    Diabetes mellitus without complication ([place])    Gout    History anabolic steroid use    Hyperlipidemia    Hypertension    Hypogonadism in male    Morbid obesity ([place])    Myocardial infarction ([place])    Peripheral vascular disease ([place])    Perirectal abscess    Pleurisy    Sleep apnea    CPAP at night, no oxygen   Varicella     Past Surgical History:  Procedure Laterality Date   ABDOMINAL AORTIC ANEURYSM REPAIR     AMPUTATION TOE Right 02/10/2016   Procedure: AMPUTATION TOE 3RD TOE;  Surgeon: Samara Deist, DPM;  Location: [facility];  Service: Podiatry;  Laterality: Right;   AMPUTATION TOE Left 02/24/2020   Procedure: AMPUTATION TOE;  Surgeon: Caroline More, DPM;  Location: [facility];  Service: Podiatry;  Laterality: Left;   APPLICATION OF WOUND VAC Left 02/29/2020   Procedure: APPLICATION OF WOUND VAC;  Surgeon: Caroline More, DPM;   Location: [facility];  Service: Podiatry;  Laterality: Left;   COLONOSCOPY WITH PROPOFOL [name] 11/18/2015   Procedure: COLONOSCOPY WITH PROPOFOL;  Surgeon: Manya Silvas, MD;  Location: [facility];  Service: Endoscopy;  Laterality: [name];   CORONARY ARTERY BYPASS GRAFT     CORONARY STENT INTERVENTION [name] 02/02/2020   Procedure: CORONARY STENT INTERVENTION;  Surgeon: Isaias Cowman, MD;  Location: [place] [facility];  Service: Cardiovascular;  Laterality: [name];   IRRIGATION AND DEBRIDEMENT FOOT Left 02/29/2020   Procedure: IRRIGATION AND DEBRIDEMENT FOOT;  Surgeon: Caroline More, DPM;  Location: [facility];  Service: Podiatry;  Laterality: Left;   IRRIGATION AND DEBRIDEMENT FOOT Left 02/24/2020   Procedure: IRRIGATION AND DEBRIDEMENT FOOT;  Surgeon: Caroline More, DPM;  Location: [facility];  Service: Podiatry;  Laterality: Left;   KNEE ARTHROSCOPY     LEFT HEART CATH AND CORS/GRAFTS ANGIOGRAPHY [name] 02/02/2020   Procedure: LEFT HEART CATH AND CORS/GRAFTS ANGIOGRAPHY;  Surgeon: Teodoro Spray, MD;  Location: [place] [facility];  Service: Cardiovascular;  Laterality: [name];   LOWER EXTREMITY ANGIOGRAPHY Left 02/25/2020   Procedure: Lower Extremity Angiography;  Surgeon: Algernon Huxley, MD;  Location: [place] [facility];  Service: Cardiovascular;  Laterality: Left;   LOWER EXTREMITY ANGIOGRAPHY Left 01/04/2021   Procedure: LOWER EXTREMITY ANGIOGRAPHY;  Surgeon: Algernon Huxley, MD;  Location: [place] [facility];  Service: Cardiovascular;  Laterality: Left;   PERIPHERAL VASCULAR CATHETERIZATION Right 01/24/2016  Procedure: Lower Extremity Angiography;  Surgeon: Katha Cabal, MD;  Location: [place] [facility];  Service: Cardiovascular;  Laterality: Right;   PERIPHERAL VASCULAR CATHETERIZATION Right 01/25/2016   Procedure: Lower Extremity Angiography;  Surgeon: Katha Cabal, MD;  Location: [place] [facility];  Service: Cardiovascular;  Laterality: Right;   TOE AMPUTATION     TONSILLECTOMY       Medications Prior to Admission  Medication Sig Dispense Refill Last Dose   apixaban (ELIQUIS) 5 MG TABS tablet Take 1 tablet (5 mg total) by mouth 2 (two) times daily. 60 tablet 5    aspirin EC 81 MG tablet Take 1 tablet (81 mg total) by mouth daily. Swallow whole. 150 tablet 2    atorvastatin (LIPITOR) 40 MG tablet Take 1 tablet (40 mg total) by mouth daily. 30 tablet 0    clopidogrel (PLAVIX) 75 MG tablet Take 1 tablet (75 mg total) by mouth daily with breakfast. 30 tablet 0    cyclobenzaprine (FLEXERIL) 5 MG tablet Take 1 tablet (5 mg total) by mouth 3 (three) times daily as needed for muscle spasms. 12 tablet 0    ezetimibe (ZETIA) 10 MG tablet Take 10 mg by mouth daily.      ferrous sulfate 325 (65 FE) MG tablet Take 325 mg by mouth daily with breakfast.      furosemide (LASIX) 20 MG tablet Take 20 mg by mouth daily as needed for fluid or edema.  (Patient not taking: Reported on 01/03/2021)   Not Taking   gabapentin (NEURONTIN) 300 MG capsule Take 300 mg by mouth daily.      insulin aspart (NOVOLOG) 100 UNIT/ML injection Inject 10 Units into the skin 3 (three) times daily with meals. (Patient taking differently: Inject 18 Units into the skin 3 (three) times daily with meals.) 10 mL 11    isosorbide mononitrate (IMDUR) 60 MG 24 hr tablet Take 60 mg by mouth 2 (two) times daily.      Lactobacillus (ACIDOPHILUS) CAPS capsule Take 1 capsule by mouth daily.      LANTUS SOLOSTAR 100 UNIT/ML Solostar Pen Inject 40 Units into the skin 2 (two) times daily. (Patient taking differently: Inject 25 Units into the skin 2 (two) times daily. Pt only takes 25 units at bedtime) 15 mL 11    losartan (COZAAR) 100 MG tablet Take 100 mg by mouth daily.      MEDIUM CHAIN TRIGLYCERIDES PO Take 1,000 mg by mouth daily.      metFORMIN (GLUCOPHAGE) 1000 MG tablet Take 1,000 mg by mouth 2 (two) times daily.       metoprolol (LOPRESSOR) 50 MG tablet Take 50 mg by mouth 2 (two) times daily.      Multiple  Vitamins-Minerals (MULTIVITAMIN WITH MINERALS) tablet Take 1 tablet by mouth daily.      nitroGLYCERIN (NITROSTAT) 0.4 MG SL tablet Place 0.4 mg under the tongue daily as needed.      oxyCODONE-acetaminophen (PERCOCET) 5-325 MG tablet Take 1 tablet by mouth every 6 (six) hours as needed for severe pain. 12 tablet 0    OZEMPIC, 0.25 OR 0.5 MG/DOSE, 2 MG/1.5ML SOPN Inject into the skin.      pramipexole (MIRAPEX) 1 MG tablet Take 2 mg by mouth at bedtime.      primidone (MYSOLINE) 250 MG tablet Take 250 mg by mouth 2 (two) times daily.      tamsulosin (FLOMAX) 0.4 MG CAPS capsule Take 0.4 mg by mouth daily.  vitamin B-12 (CYANOCOBALAMIN) 1000 MCG tablet Take 10,000 mcg by mouth daily. Energy shot      zinc gluconate 50 MG tablet Take 50 mg by mouth daily.      Social History   Socioeconomic History   Marital status: Widowed    Spouse name: Not on file   Number of children: Not on file   Years of education: Not on file   Highest education level: Not on file  Occupational History   Not on file  Tobacco Use   Smoking status: Former    Packs/day: 0.50    Years: 45.00    Pack years: 22.50    Types: Cigarettes    Quit date: 04/05/2015    Years since quitting: 5.7   Smokeless tobacco: Never  Vaping Use   Vaping Use: Every day  Substance and Sexual Activity   Alcohol use: Not Currently    Alcohol/week: 3.0 standard drinks    Types: 3 Glasses of wine per week   Drug use: No   Sexual activity: Not on file  Other Topics Concern   Not on file  Social History Narrative   Not on file   Social Determinants of Health   Financial Resource Strain: Not on file  Food Insecurity: Not on file  Transportation Needs: Not on file  Physical Activity: Not on file  Stress: Not on file  Social Connections: Not on file  Intimate Partner Violence: Not on file    History reviewed. No pertinent family history.    Review of systems complete and found to be negative unless listed above       PHYSICAL EXAM  General: Well developed, well nourished, in no acute distress HEENT:  Normocephalic and atramatic Neck:  No JVD.  Lungs: Clear bilaterally to auscultation and percussion. Heart: HRRR . Normal S1 and S2 without gallops or murmurs.  Abdomen: Bowel sounds are positive, abdomen soft and non-tender  Msk:  Back normal, normal gait. Normal strength and tone for age. Extremities: No clubbing, cyanosis or edema.   Neuro: Alert and oriented X 3. Psych:  Good affect, responds appropriately  Labs:   Lab Results  Component Value Date   WBC 7.3 01/05/2021   HGB 14.2 01/05/2021   HCT 43.8 01/05/2021   MCV 93.4 01/05/2021   PLT 143 (L) 01/05/2021    Recent Labs  Lab 01/05/21 0726  NA 135  K 4.1  CL 100  CO2 27  BUN 19  CREATININE 0.97  CALCIUM 8.1*  PROT 6.4*  BILITOT 0.5  ALKPHOS 78  ALT 13  AST 13*  GLUCOSE 325*   No results found for: CKTOTAL, CKMB, CKMBINDEX, TROPONINI No results found for: CHOL No results found for: HDL No results found for: LDLCALC No results found for: TRIG No results found for: CHOLHDL No results found for: LDLDIRECT    Radiology: MR FOOT LEFT WO CONTRAST  Result Date: 01/04/2021 CLINICAL DATA:  Diabetic foot ulcer. EXAM: MRI OF THE LEFT FOOT WITHOUT CONTRAST TECHNIQUE: Multiplanar, multisequence MR imaging of the left foot was performed. No intravenous contrast was administered. COMPARISON:  Radiographs 01/03/2021. MRI 02/23/2019 FINDINGS: There is a ulceration noted along the plantar lateral aspect of the forefoot near the level of the fifth metatarsal head. No underlying subcutaneous abscess is identified. Diffuse subcutaneous soft tissue swelling/edema/fluid suggesting cellulitis. No soft tissue abscesses are identified for certain without contrast. Diffuse myositis without findings for pyomyositis. Surgical changes related to a prior amputation involving the third digit and third  distal metatarsal. No findings suspicious septic  arthritis or osteomyelitis. Chronic cystic type degenerative changes are noted in the bases of the third, fourth and fifth metatarsals. The major tendons and ligaments appear grossly intact. IMPRESSION: 1. Soft tissue ulceration along the plantar lateral aspect of the forefoot near the level of the fifth metatarsal head. No underlying subcutaneous abscess. 2. Diffuse cellulitis and myositis. 3. No findings for septic arthritis or osteomyelitis. Electronically Signed   By: Marijo Sanes M.D.   On: 01/04/2021 05:08   PERIPHERAL VASCULAR CATHETERIZATION  Result Date: 01/04/2021 See surgical note for result.  DG Chest Port 1 View  Result Date: 01/03/2021 CLINICAL DATA:  Sepsis.  Evaluate for infiltrate. EXAM: PORTABLE CHEST 1 VIEW COMPARISON:  01/06/2020 FINDINGS: Previous median sternotomy and CABG procedure. Stable cardiomediastinal contours. Pulmonary vascular congestion. No pleural effusion or edema. No airspace densities. IMPRESSION: Pulmonary vascular congestion. Electronically Signed   By: Alec Moors M.D.   On: 01/03/2021 18:02   DG Foot Complete Left  Result Date: 01/03/2021 CLINICAL DATA:  Evaluate for osteomyelitis. EXAM: LEFT FOOT - COMPLETE 3+ VIEW COMPARISON:  12/28/2020 FINDINGS: Status post third ray amputation at the level of the head of the third metatarsal bone. Diffuse soft tissue swelling is identified. There is a focal bone erosion involving the lateral aspect of the head of the fifth metatarsal bone. This is a new finding compared with 02/25/2020, but appears similar to 12/28/20. No additional focal bone erosions. No fracture or dislocation. No radio-opaque foreign bodies. IMPRESSION: 1. Chronic bone erosion involving the lateral aspect of the head of the fifth metatarsal bone compatible with osteomyelitis. Unchanged from 12/28/2020 but new compared with 02/25/2020. 2. Diffuse soft tissue swelling. 3. Status post third ray amputation. Electronically Signed   By: Alec Moors M.D.    On: 01/03/2021 19:51   DG Foot Complete Left  Result Date: 12/31/2020 CLINICAL DATA:  Diabetic ulcer EXAM: LEFT FOOT - COMPLETE 3+ VIEW COMPARISON:  Prior radiographs of the left foot 02/25/2020 FINDINGS: Surgical changes of prior partial amputation of the third toe through the mid aspect of the metatarsal. The amputation site appears well healed. Focal soft tissue swelling present throughout the forefoot. A small rounded lucency is present underlying the fifth MTP joint. There is some high attenuation material within the lucency which is nonspecific and indeterminate. No rare fraction or destructive changes of the visualized bones to suggest osteomyelitis. No acute fracture or malalignment. No radiopaque foreign body. IMPRESSION: 1. Soft tissue ulceration along the plantar surface of the fifth MTP joint. Some internal radiodense material may represent a small amount of packing material, or mineralization of the soft tissues. 2. Surgical changes of prior healed partial amputation of the third toe. 3. Diffuse soft tissue swelling throughout the forefoot. Electronically Signed   By: Jacqulynn Cadet M.D.   On: 12/31/2020 08:48    EKG: Sinus tach nonspecific ST-T changes right bundle branch block  ASSESSMENT AND PLAN:  Sepsis Chronic osteomyelitis left foot Diabetes Coronary artery disease Hyperlipidemia Obesity Fever . Plan Agree with admit to telemetry Continue broad-spectrum antibiotic therapy Agree with ID input for management We will proceed with transesophageal echocardiogram to rule out SBE Continue to follow blood cultures Patient ultimately will need surgical debridement and possible amputation Continue Plavix and Imdur for now Maintain aggressive diabetes management and control We will arrange for TEE to be done tomorrow  Signed: Yolonda Kida MD, 01/05/2021, 9:01 AM

## 2021-01-05 NOTE — Progress Notes (Signed)
Inpatient Diabetes Program Recommendations  AACE/ADA: New Consensus Statement on Inpatient Glycemic Control   Target Ranges:  Prepandial:   less than 140 mg/dL      Peak postprandial:   less than 180 mg/dL (1-2 hours)      Critically ill patients:  140 - 180 mg/dL   Results for [name], [name]" (MRN [id number]) as of 01/05/2021 08:54  Ref. Range 01/04/2021 08:27 01/04/2021 11:57 01/04/2021 16:51 01/04/2021 17:16 01/04/2021 22:37 01/05/2021 08:18  Glucose-Capillary Latest Ref Range: 70 - 99 mg/dL 355 (H) 305 (H) 254 (H) 238 (H) 345 (H) 271 (H)   Review of Glycemic Control  Diabetes history: DM2 Outpatient Diabetes medications: Lantus 25 units QHS, Novolog 18 units TID with meals, Metformin 1000 mg BID, Ozempic 1 mg Qweek Current orders for Inpatient glycemic control: Semglee 15 units BID, Novolog 4 units TID with meals, Novolog 0-15 units TID with meals, Novolog 0-5 units QHS  Inpatient Diabetes Program Recommendations:    Insulin: Please consider increasing Semglee to 20 units BID.  Thanks, Barnie Alderman, RN, MSN, CDE Diabetes Coordinator Inpatient Diabetes Program [phone] (Team Pager from 8am to 5pm)

## 2021-01-05 NOTE — Progress Notes (Addendum)
PROGRESS NOTE    [name].  GX:[id number] DOB: 1955-10-26 DOA: 01/03/2021 PCP: [name], MD   Brief Narrative: Taken from H&P. [name]. is a 65 y.o. male with medical history significant of hypertension, heart failure, hyperlipidemia, diabetes presents with fever and chills.  Patient had a chronic ulcer beneath the fifth metatarsal of left foot, blood cultures growing Streptococcus.  MRI negative for osteomyelitis. Antibiotic switched with ceftriaxone. Podiatry is involved and he will be going for fifth ray amputation on Friday with Dr. Luana Shu Patient also underwent angiography with vascular surgery which was negative for any significant stenosis, up on the occlusion of anterior tibial artery proximally without good reconstitution distally.  9/8: Developed new onset A. fib with RVR overnight.  Failed metoprolol and diltiazem and started on amiodarone infusion by nighttime provider.  Cardiology was also consulted.  Subjective: Patient was seen and examined today.  Denies any chest pain or shortness of breath.  He denies any prior history of A. fib.  When asked about Eliquis, stating that he was taking it for the stents behind his knees.  Assessment & Plan:   Principal Problem:   Sepsis due to cellulitis [facility]) Active Problems:   Diabetes mellitus type 2 in obese (HCC)   CAD (coronary artery disease)   CAD (coronary artery disease), autologous vein bypass graft   Pure hypercholesterolemia   RLS (restless legs syndrome)   Chronic osteomyelitis of left foot (HCC)   Osteomyelitis (HCC)  Sepsis/strep bacteremia/left foot cellulitis and chronic ulcer.  MRI without signs of osteomyelitis but did show extensive cellulitis.  Preliminary blood cultures growing group G Streptococcus. Wound cultures with gram-positive cocci and rods Patient with chronic ulcers, history of third ray amputation years before.  Current ulcer of about 31 to 70-monthold at the base of  fifth metatarsal, patient follow-up with podiatry and wound clinic. Angiography with out any significant stenosis Initially started on vancomycin and Zosyn.   Podiatry was consulted-patient underwent debridement. Potential fifth ray amputation on Friday. -Switch antibiotics to ceftriaxone. -Echocardiogram-none-pending results. -ID consult  New onset A. fib with RVR.  Metoprolol and diltiazem overnight.  He was started on amiodarone infusion by the nighttime provider.  Cardiology was also consulted. -Echocardiogram done yesterday-pending results. -Will need TEE if TTE is negative. -Continue with amiodarone infusion at this time. -Already on Eliquis-we will continue  Type 2 diabetes mellitus.  A1c of 8.9.  CBG elevated. -Increase Lantus to 20 units twice daily-he was using 25 units twice daily at home. -Continue 4 units with meals -Continue with SSI.  History of CAD.  No chest pain. -Continue home dose of Plavix and isosorbide. -Continue with statin  Hypertension.  Blood pressure within goal. -Continue with losartan  BPH. -Continue with Flomax  Objective: Vitals:   01/05/21 0530 01/05/21 0545 01/05/21 0645 01/05/21 0700  BP: (!) 146/91  92/63 (!) 103/58  Pulse: (!) 111  (!) 151 67  Resp:   (!) 21 20  Temp:    97.7 F (36.5 C)  TempSrc:    Oral  SpO2: 100% 96% 94% 98%  Weight:      Height:        Intake/Output Summary (Last 24 hours) at 01/05/2021 1259 Last data filed at 01/05/2021 1018 Gross per 24 hour  Intake 1820.91 ml  Output 1525 ml  Net 295.91 ml    Filed Weights   01/04/21 0500 01/04/21 1703 01/05/21 0412  Weight: (!) 152 kg (!) 152 kg (Marland Kitchen  161.3 kg    Examination:  General.  Morbidly obese gentleman, in no acute distress. Pulmonary.  Lungs clear bilaterally, normal respiratory effort. CV.  Irregularly irregular. Abdomen.  Soft, nontender, nondistended, BS positive. CNS.  Alert and oriented x3.  No focal neurologic deficit. Extremities.  1+ LE edema,  signs of chronic venous congestion, left foot with Ace wrap. Psychiatry.  Judgment and insight appears normal.    DVT prophylaxis: Eliquis Code Status: Full Family Communication: Discussed with patient. Disposition Plan:  Status is: Inpatient  Remains inpatient appropriate because:Inpatient level of care appropriate due to severity of illness  Dispo: The patient is from: Home              Anticipated d/c is to: Home              Patient currently is not medically stable to d/c.   Difficult to place patient No              Level of care: Progressive Cardiac  All the records are reviewed and case discussed with Care Management/Social Worker. Management plans discussed with the patient, nursing and they are in agreement.  Consultants:  Podiatry  Procedures:  Antimicrobials:  Ceftriaxone  Data Reviewed: I have personally reviewed following labs and imaging studies  CBC: Recent Labs  Lab 01/03/21 1726 01/04/21 0638 01/05/21 0726  WBC 11.4* 12.0* 7.3  NEUTROABS 10.5* 11.2* 5.6  HGB 14.6 13.4 14.2  HCT 43.7 40.7 43.8  MCV 91.2 91.1 93.4  PLT 150 148* 143*    Basic Metabolic Panel: Recent Labs  Lab 01/03/21 1726 01/04/21 0638 01/05/21 0726  NA 135 134* 135  K 4.4 3.8 4.1  CL 99 100 100  CO2 '27 26 27  '$ GLUCOSE 269* 317* 325*  BUN '21 21 19  '$ CREATININE 0.85 0.98 0.97  CALCIUM 8.8* 8.5* 8.1*  MG  --   --  2.0    GFR: Estimated Creatinine Clearance: 125.2 mL/min (by C-G formula based on SCr of 0.97 mg/dL). Liver Function Tests: Recent Labs  Lab 01/03/21 1726 01/04/21 0638 01/05/21 0726  AST 18 12* 13*  ALT '16 11 13  '$ ALKPHOS 103 87 78  BILITOT 0.6 0.7 0.5  PROT 7.3 6.5 6.4*  ALBUMIN 3.4* 2.9* 2.8*    No results for input(s): LIPASE, AMYLASE in the last 168 hours. No results for input(s): AMMONIA in the last 168 hours. Coagulation Profile: Recent Labs  Lab 01/03/21 1726 01/05/21 0726  INR 1.0 1.2    Cardiac Enzymes: No results for input(s):  CKTOTAL, CKMB, CKMBINDEX, TROPONINI in the last 168 hours. BNP (last 3 results) No results for input(s): PROBNP in the last 8760 hours. HbA1C: Recent Labs    01/04/21 0638  HGBA1C 8.9*    CBG: Recent Labs  Lab 01/04/21 1651 01/04/21 1716 01/04/21 2237 01/05/21 0818 01/05/21 1146  GLUCAP 254* 238* 345* 271* 351*    Lipid Profile: No results for input(s): CHOL, HDL, LDLCALC, TRIG, CHOLHDL, LDLDIRECT in the last 72 hours. Thyroid Function Tests: No results for input(s): TSH, T4TOTAL, FREET4, T3FREE, THYROIDAB in the last 72 hours. Anemia Panel: No results for input(s): VITAMINB12, FOLATE, FERRITIN, TIBC, IRON, RETICCTPCT in the last 72 hours. Sepsis Labs: Recent Labs  Lab 01/03/21 1718 01/03/21 1927  LATICACIDVEN 1.7 1.1     Recent Results (from the past 240 hour(s))  Blood culture (routine single)     Status: Abnormal (Preliminary result)   Collection Time: 01/03/21  5:37 PM   Specimen: BLOOD  Result Value Ref Range Status   Specimen Description   Final    BLOOD BLOOD RIGHT FOREARM Performed at [facility], [address] [place], [place] [address]    Special Requests   Final    BOTTLES DRAWN AEROBIC AND ANAEROBIC Blood Culture adequate volume Performed at [facility], [name] [place], [place] [address]    Culture  Setup Time   Final    GRAM POSITIVE COCCI AEROBIC BOTTLE ONLY CRITICAL RESULT CALLED TO, READ BACK BY AND VERIFIED WITH: Aldona Bar RAUER 01/04/21 0749 KLW    Culture (A)  Final    STREPTOCOCCUS GROUP G SUSCEPTIBILITIES TO FOLLOW Performed at [facility], [place] [address], [place], [place] [address]    Report Status PENDING  Incomplete  Blood Culture ID Panel (Reflexed)     Status: Abnormal   Collection Time: 01/03/21  5:37 PM  Result Value Ref Range Status   Enterococcus faecalis NOT DETECTED NOT DETECTED Final   Enterococcus Faecium NOT DETECTED NOT DETECTED Final   Listeria monocytogenes NOT DETECTED NOT  DETECTED Final   Staphylococcus species NOT DETECTED NOT DETECTED Final   Staphylococcus aureus (BCID) NOT DETECTED NOT DETECTED Final   Staphylococcus epidermidis NOT DETECTED NOT DETECTED Final   Staphylococcus lugdunensis NOT DETECTED NOT DETECTED Final   Streptococcus species DETECTED (A) NOT DETECTED Final    Comment: Not Enterococcus species, Streptococcus agalactiae, Streptococcus pyogenes, or Streptococcus pneumoniae. CRITICAL RESULT CALLED TO, READ BACK BY AND VERIFIED WITH: SAMANTHA RAUER 01/04/21 0749 KLW    Streptococcus agalactiae NOT DETECTED NOT DETECTED Final   Streptococcus pneumoniae NOT DETECTED NOT DETECTED Final   Streptococcus pyogenes NOT DETECTED NOT DETECTED Final   A.calcoaceticus-baumannii NOT DETECTED NOT DETECTED Final   Bacteroides fragilis NOT DETECTED NOT DETECTED Final   Enterobacterales NOT DETECTED NOT DETECTED Final   Enterobacter cloacae complex NOT DETECTED NOT DETECTED Final   Escherichia coli NOT DETECTED NOT DETECTED Final   Klebsiella aerogenes NOT DETECTED NOT DETECTED Final   Klebsiella oxytoca NOT DETECTED NOT DETECTED Final   Klebsiella pneumoniae NOT DETECTED NOT DETECTED Final   Proteus species NOT DETECTED NOT DETECTED Final   Salmonella species NOT DETECTED NOT DETECTED Final   Serratia marcescens NOT DETECTED NOT DETECTED Final   Haemophilus influenzae NOT DETECTED NOT DETECTED Final   Neisseria meningitidis NOT DETECTED NOT DETECTED Final   Pseudomonas aeruginosa NOT DETECTED NOT DETECTED Final   Stenotrophomonas maltophilia NOT DETECTED NOT DETECTED Final   Candida albicans NOT DETECTED NOT DETECTED Final   Candida auris NOT DETECTED NOT DETECTED Final   Candida glabrata NOT DETECTED NOT DETECTED Final   Candida krusei NOT DETECTED NOT DETECTED Final   Candida parapsilosis NOT DETECTED NOT DETECTED Final   Candida tropicalis NOT DETECTED NOT DETECTED Final   Cryptococcus neoformans/gattii NOT DETECTED NOT DETECTED Final    Comment:  Performed at [facility], [address] [place],  [address]  Resp Panel by RT-PCR (Flu A&B, Covid) Nasopharyngeal Swab     Status: None   Collection Time: 01/03/21  6:24 PM   Specimen: Nasopharyngeal Swab; Nasopharyngeal(NP) swabs in vial transport medium  Result Value Ref Range Status   SARS Coronavirus 2 by RT PCR NEGATIVE NEGATIVE Final    Comment: (NOTE) SARS-CoV-2 target nucleic acids are NOT DETECTED.  The SARS-CoV-2 RNA is generally detectable in upper respiratory specimens during the acute phase of infection. The lowest concentration of SARS-CoV-2 viral copies this assay can detect is 138 copies/mL.  A negative result does not preclude SARS-Cov-2 infection and should not be used as the sole basis for treatment or other patient management decisions. A negative result may occur with  improper specimen collection/handling, submission of specimen other than nasopharyngeal swab, presence of viral mutation(s) within the areas targeted by this assay, and inadequate number of viral copies(<138 copies/mL). A negative result must be combined with clinical observations, patient history, and epidemiological information. The expected result is Negative.  Fact Sheet for Patients:  EntrepreneurPulse.com.au  Fact Sheet for Healthcare Providers:  IncredibleEmployment.be  This test is no t yet approved or cleared by the [place] FDA and  has been authorized for detection and/or diagnosis of SARS-CoV-2 by FDA under an Emergency Use Authorization (EUA). This EUA will remain  in effect (meaning this test can be used) for the duration of the COVID-19 declaration under Section 564(b)(1) of the Act, 21 U.S.C.section 360bbb-3(b)(1), unless the authorization is terminated  or revoked sooner.       Influenza A by PCR NEGATIVE NEGATIVE Final   Influenza B by PCR NEGATIVE NEGATIVE Final    Comment: (NOTE) The Xpert Xpress  SARS-CoV-2/FLU/RSV plus assay is intended as an aid in the diagnosis of influenza from Nasopharyngeal swab specimens and should not be used as a sole basis for treatment. Nasal washings and aspirates are unacceptable for Xpert Xpress SARS-CoV-2/FLU/RSV testing.  Fact Sheet for Patients: EntrepreneurPulse.com.au  Fact Sheet for Healthcare Providers: IncredibleEmployment.be  This test is not yet approved or cleared by the [place] FDA and has been authorized for detection and/or diagnosis of SARS-CoV-2 by FDA under an Emergency Use Authorization (EUA). This EUA will remain in effect (meaning this test can be used) for the duration of the COVID-19 declaration under Section 564(b)(1) of the Act, 21 U.S.C. section 360bbb-3(b)(1), unless the authorization is terminated or revoked.  Performed at [facility], [address] [place], [place] [address]   Blood culture (single)     Status: Abnormal (Preliminary result)   Collection Time: 01/03/21  7:15 PM   Specimen: BLOOD  Result Value Ref Range Status   Specimen Description   Final    BLOOD BLOOD LEFT HAND Performed at [facility], [address] [place], [place] [address]    Special Requests   Final    BOTTLES DRAWN AEROBIC AND ANAEROBIC Blood Culture results may not be optimal due to an excessive volume of blood received in culture bottles Performed at [facility], [address] [place], [place] [address]    Culture  Setup Time   Final    GRAM POSITIVE COCCI ANAEROBIC BOTTLE ONLY CRITICAL RESULT CALLED TO, READ BACK BY AND VERIFIED WITH: Coralie Carpen 01/04/21 [place] Performed at [facility], [address] [place],  [address]    Culture STREPTOCOCCUS GROUP G (A)  Final   Report Status PENDING  Incomplete  MRSA Next Gen by PCR, Nasal     Status: None   Collection Time: 01/04/21  7:52 AM   Specimen: Nasal Mucosa; Nasal Swab  Result  Value Ref Range Status   MRSA by PCR Next Gen NOT DETECTED NOT DETECTED Final    Comment: (NOTE) The GeneXpert MRSA Assay (FDA approved for NASAL specimens only), is one component of a comprehensive MRSA colonization surveillance program. It is not intended to diagnose MRSA infection nor to guide or monitor treatment for MRSA infections. Test performance is not FDA approved in patients less than 33 years old. Performed at [facility], [place], [place] [address]   Aerobic/Anaerobic Culture w Gram Stain (surgical/deep wound)     Status: None (Preliminary result)   Collection Time: 01/04/21 12:39 PM   Specimen: Foot; Wound  Result Value Ref Range Status   Specimen Description   Final    FOOT LEFT Performed at [facility], [address], [place], [place] [address]    Special Requests   Final    NONE Performed at [facility], [place], [place] [address]    Gram Stain   Final    FEW SQUAMOUS EPITHELIAL CELLS PRESENT FEW WBC SEEN MODERATE GRAM POSITIVE COCCI FEW GRAM POSITIVE RODS    Culture   Final    CULTURE REINCUBATED FOR BETTER GROWTH Performed at [facility], [place] [address] [place], [place] [address]    Report Status PENDING  Incomplete      Radiology Studies: MR FOOT LEFT WO CONTRAST  Result Date: 01/04/2021 CLINICAL DATA:  Diabetic foot ulcer. EXAM: MRI OF THE LEFT FOOT WITHOUT CONTRAST TECHNIQUE: Multiplanar, multisequence MR imaging of the left foot was performed. No intravenous contrast was administered. COMPARISON:  Radiographs 01/03/2021. MRI 02/23/2019 FINDINGS: There is a ulceration noted along the plantar lateral aspect of the forefoot near the level of the fifth metatarsal head. No underlying subcutaneous abscess is identified. Diffuse subcutaneous soft tissue swelling/edema/fluid suggesting cellulitis. No soft tissue abscesses are identified for certain without contrast. Diffuse myositis without  findings for pyomyositis. Surgical changes related to a prior amputation involving the third digit and third distal metatarsal. No findings suspicious septic arthritis or osteomyelitis. Chronic cystic type degenerative changes are noted in the bases of the third, fourth and fifth metatarsals. The major tendons and ligaments appear grossly intact. IMPRESSION: 1. Soft tissue ulceration along the plantar lateral aspect of the forefoot near the level of the fifth metatarsal head. No underlying subcutaneous abscess. 2. Diffuse cellulitis and myositis. 3. No findings for septic arthritis or osteomyelitis. Electronically Signed   By: Marijo Sanes M.D.   On: 01/04/2021 05:08   PERIPHERAL VASCULAR CATHETERIZATION  Result Date: 01/04/2021 See surgical note for result.  DG Chest Port 1 View  Result Date: 01/03/2021 CLINICAL DATA:  Sepsis.  Evaluate for infiltrate. EXAM: PORTABLE CHEST 1 VIEW COMPARISON:  01/06/2020 FINDINGS: Previous median sternotomy and CABG procedure. Stable cardiomediastinal contours. Pulmonary vascular congestion. No pleural effusion or edema. No airspace densities. IMPRESSION: Pulmonary vascular congestion. Electronically Signed   By: Kerby Moors M.D.   On: 01/03/2021 18:02   DG Foot Complete Left  Result Date: 01/03/2021 CLINICAL DATA:  Evaluate for osteomyelitis. EXAM: LEFT FOOT - COMPLETE 3+ VIEW COMPARISON:  12/28/2020 FINDINGS: Status post third ray amputation at the level of the head of the third metatarsal bone. Diffuse soft tissue swelling is identified. There is a focal bone erosion involving the lateral aspect of the head of the fifth metatarsal bone. This is a new finding compared with 02/25/2020, but appears similar to 12/28/20. No additional focal bone erosions. No fracture or dislocation. No radio-opaque foreign bodies. IMPRESSION: 1. Chronic bone erosion involving the lateral aspect of the head of the fifth metatarsal bone compatible with osteomyelitis. Unchanged from  12/28/2020 but new compared with 02/25/2020. 2. Diffuse soft tissue swelling. 3. Status post third ray amputation. Electronically Signed   By: Kerby Moors M.D.   On: 01/03/2021 19:51    Scheduled Meds:  apixaban  5 mg Oral BID   atorvastatin  20 mg Oral QHS   chlorhexidine  60 mL Topical Once   clopidogrel  75 mg Oral Daily   diltiazem  40 mg Intravenous Once   ezetimibe  10 mg Oral QHS   insulin aspart  0-15 Units Subcutaneous TID WC   insulin aspart  0-5 Units Subcutaneous QHS   insulin aspart  4 Units Subcutaneous TID WC   insulin glargine-yfgn  15 Units Subcutaneous BID   metoprolol tartrate  50 mg Oral BID   povidone-iodine  2 application Topical Once   tamsulosin  0.4 mg Oral Daily   Continuous Infusions:  amiodarone 60 mg/hr (01/05/21 1009)   Followed by   amiodarone     cefTRIAXone (ROCEPHIN)  IV 2 g (01/04/21 1551)   diltiazem (CARDIZEM) infusion 5 mg/hr (01/05/21 0539)     LOS: 2 days   Time spent: 45 minutes. More than 50% of the time was spent in counseling/coordination of care  Lorella Nimrod, MD [facility]  If 7PM-7AM, please contact night-coverage [email or web address]  01/05/2021, 12:59 PM   This record has been created using Systems analyst. Errors have been sought and corrected,but may not always be located. Such creation errors do not reflect on the standard of care.

## 2021-01-05 NOTE — Progress Notes (Signed)
   01/05/21 0700  Assess: MEWS Score  Temp 97.7 F (36.5 C)  BP (!) 103/58  Pulse Rate 67  ECG Heart Rate (!) 119  Resp 20  Level of Consciousness Alert  SpO2 98 %  Assess: MEWS Score  MEWS Temp 0  MEWS Systolic 0  MEWS Pulse 2  MEWS RR 0  MEWS LOC 0  MEWS Score 2  MEWS Score Color Yellow  Assess: if the MEWS score is Yellow or Red  Were vital signs taken at a resting state? Yes  Focused Assessment No change from prior assessment  Does the patient meet 2 or more of the SIRS criteria? No  MEWS guidelines implemented *See Row Information* No, previously red, continue vital signs every 4 hours  Treat  MEWS Interventions Other (Comment) (Pt taken off of cardizem drip (due to low BP) and changed to amiodarone drip immediately prior to this shift)  Assess: SIRS CRITERIA  SIRS Temperature  0  SIRS Pulse 1  SIRS Respirations  0  SIRS WBC 0  SIRS Score Sum  1

## 2021-01-06 ENCOUNTER — Encounter
Admission: EM | Disposition: A | Discharge status: Skilled Nursing Facility | Payer: Self-pay | Source: Home / Self Care | Attending: Internal Medicine

## 2021-01-06 ENCOUNTER — Inpatient Hospital Stay: Admit: 2021-01-06 | Payer: Medicare Other

## 2021-01-06 DIAGNOSIS — L089 Local infection of the skin and subcutaneous tissue, unspecified: Secondary | ICD-10-CM

## 2021-01-06 DIAGNOSIS — E1169 Type 2 diabetes mellitus with other specified complication: Secondary | ICD-10-CM | POA: Diagnosis not present

## 2021-01-06 DIAGNOSIS — E11628 Type 2 diabetes mellitus with other skin complications: Secondary | ICD-10-CM

## 2021-01-06 DIAGNOSIS — B955 Unspecified streptococcus as the cause of diseases classified elsewhere: Secondary | ICD-10-CM | POA: Diagnosis not present

## 2021-01-06 DIAGNOSIS — R7881 Bacteremia: Secondary | ICD-10-CM | POA: Diagnosis not present

## 2021-01-06 DIAGNOSIS — L0390 Cellulitis, unspecified: Secondary | ICD-10-CM | POA: Diagnosis not present

## 2021-01-06 DIAGNOSIS — A419 Sepsis, unspecified organism: Secondary | ICD-10-CM | POA: Diagnosis not present

## 2021-01-06 DIAGNOSIS — I2510 Atherosclerotic heart disease of native coronary artery without angina pectoris: Secondary | ICD-10-CM | POA: Diagnosis not present

## 2021-01-06 LAB — C-REACTIVE PROTEIN: CRP: 16.1 mg/dL — ABNORMAL HIGH (ref <1.0)

## 2021-01-06 LAB — CULTURE, BLOOD (SINGLE)

## 2021-01-06 LAB — GLUCOSE, CAPILLARY
Glucose-Capillary: 272 mg/dL — ABNORMAL HIGH (ref 70–99)
Glucose-Capillary: 238 mg/dL — ABNORMAL HIGH (ref 70–99)
Glucose-Capillary: 216 mg/dL — ABNORMAL HIGH (ref 70–99)
Glucose-Capillary: 302 mg/dL — ABNORMAL HIGH (ref 70–99)

## 2021-01-06 SURGERY — ECHOCARDIOGRAM, TRANSESOPHAGEAL
Anesthesia: Moderate Sedation

## 2021-01-06 MED ORDER — INSULIN GLARGINE-YFGN 100 UNIT/ML ~~LOC~~ SOLN
20.0000 [IU] | Freq: Two times a day (BID) | SUBCUTANEOUS | Status: DC
  Administered 2021-01-06 – 2021-01-08 (×5): 20 [IU] via SUBCUTANEOUS
  Filled 2021-01-06 (×7): qty 0.2

## 2021-01-06 MED ORDER — SODIUM CHLORIDE 0.9 % IV SOLN
3.0000 g | Freq: Four times a day (QID) | INTRAVENOUS | Status: DC
  Administered 2021-01-06 – 2021-01-16 (×39): 3 g via INTRAVENOUS
  Filled 2021-01-06 (×3): qty 3
  Filled 2021-01-06: qty 8
  Filled 2021-01-06: qty 3
  Filled 2021-01-06: qty 8
  Filled 2021-01-06 (×2): qty 3
  Filled 2021-01-06 (×2): qty 8
  Filled 2021-01-06 (×3): qty 3
  Filled 2021-01-06: qty 8
  Filled 2021-01-06: qty 3
  Filled 2021-01-06 (×5): qty 8
  Filled 2021-01-06 (×4): qty 3
  Filled 2021-01-06: qty 8
  Filled 2021-01-06: qty 3
  Filled 2021-01-06: qty 8
  Filled 2021-01-06 (×3): qty 3
  Filled 2021-01-06: qty 8
  Filled 2021-01-06 (×2): qty 3
  Filled 2021-01-06: qty 8
  Filled 2021-01-06 (×2): qty 3
  Filled 2021-01-06 (×4): qty 8
  Filled 2021-01-06 (×2): qty 3
  Filled 2021-01-06: qty 8

## 2021-01-06 MED ORDER — PRAMIPEXOLE DIHYDROCHLORIDE 1 MG PO TABS
2.0000 mg | ORAL_TABLET | Freq: Every day | ORAL | Status: DC
  Administered 2021-01-06 – 2021-01-18 (×13): 2 mg via ORAL
  Filled 2021-01-06 (×14): qty 2

## 2021-01-06 MED ORDER — DILTIAZEM HCL 30 MG PO TABS
30.0000 mg | ORAL_TABLET | Freq: Four times a day (QID) | ORAL | Status: DC
  Administered 2021-01-06 – 2021-01-07 (×3): 30 mg via ORAL
  Filled 2021-01-06 (×3): qty 1

## 2021-01-06 MED ORDER — DIGOXIN 0.25 MG/ML IJ SOLN
0.2500 mg | Freq: Once | INTRAMUSCULAR | Status: AC
  Administered 2021-01-06: 0.25 mg via INTRAVENOUS
  Filled 2021-01-06: qty 2

## 2021-01-06 MED ORDER — AMIODARONE HCL 200 MG PO TABS
400.0000 mg | ORAL_TABLET | Freq: Every day | ORAL | Status: DC
  Administered 2021-01-06 – 2021-01-12 (×7): 400 mg via ORAL
  Filled 2021-01-06 (×7): qty 2

## 2021-01-06 MED ORDER — DIGOXIN 0.25 MG/ML IJ SOLN
0.1250 mg | Freq: Every day | INTRAMUSCULAR | Status: DC
  Administered 2021-01-07 – 2021-01-12 (×6): 0.125 mg via INTRAVENOUS
  Filled 2021-01-06 (×6): qty 2

## 2021-01-06 MED ORDER — SODIUM CHLORIDE 0.9 % IV SOLN: INTRAVENOUS | Status: DC

## 2021-01-06 MED ORDER — DIGOXIN 0.25 MG/ML IJ SOLN
0.5000 mg | Freq: Once | INTRAMUSCULAR | Status: AC
  Administered 2021-01-06: 0.5 mg via INTRAVENOUS
  Filled 2021-01-06: qty 2

## 2021-01-06 MED ORDER — PRIMIDONE 250 MG PO TABS
250.0000 mg | ORAL_TABLET | Freq: Two times a day (BID) | ORAL | Status: DC
  Administered 2021-01-06 – 2021-01-19 (×26): 250 mg via ORAL
  Filled 2021-01-06 (×27): qty 1

## 2021-01-06 MED ORDER — METOPROLOL TARTRATE 50 MG PO TABS
50.0000 mg | ORAL_TABLET | Freq: Three times a day (TID) | ORAL | Status: DC
  Administered 2021-01-06 – 2021-01-08 (×6): 50 mg via ORAL
  Filled 2021-01-06 (×5): qty 1

## 2021-01-06 NOTE — Progress Notes (Signed)
Per Dr Clayborn Bigness, TEE will be canceled today. Ok to put in diet order per MD. Diet order placed

## 2021-01-06 NOTE — Progress Notes (Signed)
Pt HR now running in the mid 120's to mid 140's consistently. He had PO 400 mg amiodarone and 0.'5mg'$  digoxin at 1515. Amio drip stopped at 1615. Gave '50mg'$  Metoprolol at 1722. MD notified. Waiting on further orders. Will continue to monitor.

## 2021-01-06 NOTE — Progress Notes (Signed)
PODIATRY / FOOT AND ANKLE SURGERY PROGRESS NOTE  Requesting Physician: Dr. Reesa Chew  Reason for consult: Foot infection, wounds, sepsis   HPI: [name]. is a 65 y.o. male who presents today resting in bed comfortably.  Patient denies nausea, vomiting, fever, chills.  Patient has had recent on set of acute A. fib with unstable high heart rate.  Unfortunately patient surgery had to be canceled due to this and he is being worked up further by cardiology.  PMHx:  Past Medical History:  Diagnosis Date   Asthma    Coronary artery disease    Depression    Diabetes mellitus without complication ([place])    Gout    History anabolic steroid use    Hyperlipidemia    Hypertension    Hypogonadism in male    Morbid obesity ([place])    Myocardial infarction ([place])    Peripheral vascular disease ([place])    Perirectal abscess    Pleurisy    Sleep apnea    CPAP at night, no oxygen   Varicella     Surgical Hx:  Past Surgical History:  Procedure Laterality Date   ABDOMINAL AORTIC ANEURYSM REPAIR     AMPUTATION TOE Right 02/10/2016   Procedure: AMPUTATION TOE 3RD TOE;  Surgeon: Samara Deist, DPM;  Location: [facility];  Service: Podiatry;  Laterality: Right;   AMPUTATION TOE Left 02/24/2020   Procedure: AMPUTATION TOE;  Surgeon: Caroline More, DPM;  Location: [facility];  Service: Podiatry;  Laterality: Left;   APPLICATION OF WOUND VAC Left 02/29/2020   Procedure: APPLICATION OF WOUND VAC;  Surgeon: Caroline More, DPM;  Location: [facility];  Service: Podiatry;  Laterality: Left;   COLONOSCOPY WITH PROPOFOL [name] 11/18/2015   Procedure: COLONOSCOPY WITH PROPOFOL;  Surgeon: Manya Silvas, MD;  Location: [facility];  Service: Endoscopy;  Laterality: [name];   CORONARY ARTERY BYPASS GRAFT     CORONARY STENT INTERVENTION [name] 02/02/2020   Procedure: CORONARY STENT INTERVENTION;  Surgeon: Isaias Cowman, MD;  Location: [place] [facility];  Service: Cardiovascular;  Laterality: [name];   IRRIGATION  AND DEBRIDEMENT FOOT Left 02/29/2020   Procedure: IRRIGATION AND DEBRIDEMENT FOOT;  Surgeon: Caroline More, DPM;  Location: [facility];  Service: Podiatry;  Laterality: Left;   IRRIGATION AND DEBRIDEMENT FOOT Left 02/24/2020   Procedure: IRRIGATION AND DEBRIDEMENT FOOT;  Surgeon: Caroline More, DPM;  Location: [facility];  Service: Podiatry;  Laterality: Left;   KNEE ARTHROSCOPY     LEFT HEART CATH AND CORS/GRAFTS ANGIOGRAPHY [name] 02/02/2020   Procedure: LEFT HEART CATH AND CORS/GRAFTS ANGIOGRAPHY;  Surgeon: Teodoro Spray, MD;  Location: [place] [facility];  Service: Cardiovascular;  Laterality: [name];   LOWER EXTREMITY ANGIOGRAPHY Left 02/25/2020   Procedure: Lower Extremity Angiography;  Surgeon: Algernon Huxley, MD;  Location: [place] [facility];  Service: Cardiovascular;  Laterality: Left;   LOWER EXTREMITY ANGIOGRAPHY Left 01/04/2021   Procedure: LOWER EXTREMITY ANGIOGRAPHY;  Surgeon: Algernon Huxley, MD;  Location: [place] [facility];  Service: Cardiovascular;  Laterality: Left;   PERIPHERAL VASCULAR CATHETERIZATION Right 01/24/2016   Procedure: Lower Extremity Angiography;  Surgeon: Katha Cabal, MD;  Location: [place] [facility];  Service: Cardiovascular;  Laterality: Right;   PERIPHERAL VASCULAR CATHETERIZATION Right 01/25/2016   Procedure: Lower Extremity Angiography;  Surgeon: Katha Cabal, MD;  Location: [place] [facility];  Service: Cardiovascular;  Laterality: Right;   TOE AMPUTATION     TONSILLECTOMY      FHx: History reviewed.  No pertinent family history.  Social History:  reports that he quit smoking about 5 years ago. His smoking use included cigarettes. He has a 22.50 pack-year smoking history. He has never used smokeless tobacco. He reports that he does not currently use alcohol after a past usage of about 3.0 standard drinks per week. He reports that he does not use drugs.  Allergies:  Allergies  Allergen Reactions   Statins     Other reaction(s): Muscle  Pain Causes legs to ache per pt    Medications Prior to Admission  Medication Sig Dispense Refill   insulin lispro (HUMALOG) 100 UNIT/ML injection Inject 15-28 Units into the skin 3 (three) times daily with meals.     apixaban (ELIQUIS) 5 MG TABS tablet Take 1 tablet (5 mg total) by mouth 2 (two) times daily. 60 tablet 5   aspirin EC 81 MG tablet Take 1 tablet (81 mg total) by mouth daily. Swallow whole. 150 tablet 2   atorvastatin (LIPITOR) 40 MG tablet Take 1 tablet (40 mg total) by mouth daily. 30 tablet 0   clopidogrel (PLAVIX) 75 MG tablet Take 1 tablet (75 mg total) by mouth daily with breakfast. 30 tablet 0   cyclobenzaprine (FLEXERIL) 5 MG tablet Take 1 tablet (5 mg total) by mouth 3 (three) times daily as needed for muscle spasms. (Patient not taking: No sig reported) 12 tablet 0   ezetimibe (ZETIA) 10 MG tablet Take 10 mg by mouth daily.     ferrous sulfate 325 (65 FE) MG tablet Take 325 mg by mouth daily with breakfast.     furosemide (LASIX) 20 MG tablet Take 20 mg by mouth daily as needed for fluid or edema.     gabapentin (NEURONTIN) 300 MG capsule Take 300 mg by mouth daily.     insulin aspart (NOVOLOG) 100 UNIT/ML injection Inject 10 Units into the skin 3 (three) times daily with meals. (Patient not taking: No sig reported) 10 mL 11   isosorbide mononitrate (IMDUR) 60 MG 24 hr tablet Take 60 mg by mouth 2 (two) times daily.     Lactobacillus (ACIDOPHILUS) CAPS capsule Take 1 capsule by mouth daily. (Patient not taking: Reported on 01/05/2021)     LANTUS SOLOSTAR 100 UNIT/ML Solostar Pen Inject 40 Units into the skin 2 (two) times daily. (Patient taking differently: Inject 25 Units into the skin 2 (two) times daily.) 15 mL 11   losartan (COZAAR) 100 MG tablet Take 100 mg by mouth daily.     MEDIUM CHAIN TRIGLYCERIDES PO Take 1,000 mg by mouth daily.     metFORMIN (GLUCOPHAGE) 1000 MG tablet Take 1,000 mg by mouth 2 (two) times daily.      metoprolol (LOPRESSOR) 50 MG tablet Take  50 mg by mouth 2 (two) times daily.     Multiple Vitamins-Minerals (MULTIVITAMIN WITH MINERALS) tablet Take 1 tablet by mouth daily.     nitroGLYCERIN (NITROSTAT) 0.4 MG SL tablet Place 0.4 mg under the tongue daily as needed.     oxyCODONE-acetaminophen (PERCOCET) 5-325 MG tablet Take 1 tablet by mouth every 6 (six) hours as needed for severe pain. (Patient not taking: No sig reported) 12 tablet 0   OZEMPIC, 0.25 OR 0.5 MG/DOSE, 2 MG/1.5ML SOPN Inject into the skin.     pramipexole (MIRAPEX) 1 MG tablet Take 2 mg by mouth at bedtime.     primidone (MYSOLINE) 250 MG tablet Take 250 mg by mouth 2 (two) times daily.     tamsulosin (FLOMAX) 0.4  MG CAPS capsule Take 0.4 mg by mouth daily.     vitamin B-12 (CYANOCOBALAMIN) 1000 MCG tablet Take 10,000 mcg by mouth daily. Energy shot     zinc gluconate 50 MG tablet Take 50 mg by mouth daily.      Physical Exam: General: Alert and oriented.  No apparent distress.  Vascular: Difficult to palpate DP and PT pulses bilateral due to swelling present.  Mild to moderate bilateral lower extremity nonpitting edema.  Neuro: Light touch sensation nearly absent to bilateral lower extremities.  Derm: Ulceration subfifth metatarsal phalange joint left foot which measures approximately 0.5 x 0.3 x 0.3 cm and probes close to bone but no obvious bone or capsule exposed at this time.  No odor, no associated erythema, mild serous drainage, no purulence.  MSK: Partial third ray amputation bilateral.  Limited ankle joint dorsiflexion noted with knee extended which is minimally improved knee flexion bilateral.  Results for orders placed or performed during the hospital encounter of 01/03/21 (from the past 48 hour(s))  Glucose, capillary     Status: Abnormal   Collection Time: 01/04/21  4:51 PM  Result Value Ref Range   Glucose-Capillary 254 (H) 70 - 99 mg/dL    Comment: Glucose reference range applies only to samples taken after fasting for at least 8 hours.   Glucose, capillary     Status: Abnormal   Collection Time: 01/04/21  5:16 PM  Result Value Ref Range   Glucose-Capillary 238 (H) 70 - 99 mg/dL    Comment: Glucose reference range applies only to samples taken after fasting for at least 8 hours.  Glucose, capillary     Status: Abnormal   Collection Time: 01/04/21 10:37 PM  Result Value Ref Range   Glucose-Capillary 345 (H) 70 - 99 mg/dL    Comment: Glucose reference range applies only to samples taken after fasting for at least 8 hours.  Comprehensive metabolic panel     Status: Abnormal   Collection Time: 01/05/21  7:26 AM  Result Value Ref Range   Sodium 135 135 - 145 mmol/L   Potassium 4.1 3.5 - 5.1 mmol/L   Chloride 100 98 - 111 mmol/L   CO2 27 22 - 32 mmol/L   Glucose, Bld 325 (H) 70 - 99 mg/dL    Comment: Glucose reference range applies only to samples taken after fasting for at least 8 hours.   BUN 19 8 - 23 mg/dL   Creatinine, Ser 0.97 0.61 - 1.24 mg/dL   Calcium 8.1 (L) 8.9 - 10.3 mg/dL   Total Protein 6.4 (L) 6.5 - 8.1 g/dL   Albumin 2.8 (L) 3.5 - 5.0 g/dL   AST 13 (L) 15 - 41 U/L   ALT 13 0 - 44 U/L   Alkaline Phosphatase 78 38 - 126 U/L   Total Bilirubin 0.5 0.3 - 1.2 mg/dL   GFR, Estimated >60 >60 mL/min    Comment: (NOTE) Calculated using the CKD-EPI Creatinine Equation (2021)    Anion gap 8 5 - 15    Comment: Performed at [facility], [address], [place], [place] [address]  Magnesium     Status: None   Collection Time: 01/05/21  7:26 AM  Result Value Ref Range   Magnesium 2.0 1.7 - 2.4 mg/dL    Comment: Performed at [facility], [address], [place], [place] [address]  CBC with Differential/Platelet     Status: Abnormal   Collection Time: 01/05/21  7:26 AM  Result Value  Ref Range   WBC 7.3 4.0 - 10.5 K/uL   RBC 4.69 4.22 - 5.81 MIL/uL   Hemoglobin 14.2 13.0 - 17.0 g/dL   HCT 43.8 39.0 - 52.0 %   MCV 93.4 80.0 - 100.0 fL   MCH 30.3 26.0 - 34.0 pg   MCHC 32.4 30.0 - 36.0 g/dL    RDW 14.6 11.5 - 15.5 %   Platelets 143 (L) 150 - 400 K/uL   nRBC 0.0 0.0 - 0.2 %   Neutrophils Relative % 77 %   Neutro Abs 5.6 1.7 - 7.7 K/uL   Lymphocytes Relative 13 %   Lymphs Abs 0.9 0.7 - 4.0 K/uL   Monocytes Relative 6 %   Monocytes Absolute 0.5 0.1 - 1.0 K/uL   Eosinophils Relative 3 %   Eosinophils Absolute 0.3 0.0 - 0.5 K/uL   Basophils Relative 1 %   Basophils Absolute 0.0 0.0 - 0.1 K/uL   Immature Granulocytes 0 %   Abs Immature Granulocytes 0.03 0.00 - 0.07 K/uL    Comment: Performed at [facility], [address] [place], [place] [address]  APTT     Status: None   Collection Time: 01/05/21  7:26 AM  Result Value Ref Range   aPTT 35 24 - 36 seconds    Comment: Performed at [facility], [address] [place], [place] [address]  Protime-INR     Status: None   Collection Time: 01/05/21  7:26 AM  Result Value Ref Range   Prothrombin Time 15.0 11.4 - 15.2 seconds   INR 1.2 0.8 - 1.2    Comment: (NOTE) INR goal varies based on device and disease states. Performed at [facility], [address] [place], [place] [address]   Glucose, capillary     Status: Abnormal   Collection Time: 01/05/21  8:18 AM  Result Value Ref Range   Glucose-Capillary 271 (H) 70 - 99 mg/dL    Comment: Glucose reference range applies only to samples taken after fasting for at least 8 hours.  Glucose, capillary     Status: Abnormal   Collection Time: 01/05/21 11:46 AM  Result Value Ref Range   Glucose-Capillary 351 (H) 70 - 99 mg/dL    Comment: Glucose reference range applies only to samples taken after fasting for at least 8 hours.  Glucose, capillary     Status: Abnormal   Collection Time: 01/05/21  5:33 PM  Result Value Ref Range   Glucose-Capillary 399 (H) 70 - 99 mg/dL    Comment: Glucose reference range applies only to samples taken after fasting for at least 8 hours.  Glucose, capillary     Status: Abnormal   Collection Time: 01/05/21  9:02 PM   Result Value Ref Range   Glucose-Capillary 389 (H) 70 - 99 mg/dL    Comment: Glucose reference range applies only to samples taken after fasting for at least 8 hours.  CULTURE, BLOOD (ROUTINE X 2) w Reflex to ID Panel     Status: None (Preliminary result)   Collection Time: 01/06/21 12:23 AM   Specimen: BLOOD  Result Value Ref Range   Specimen Description BLOOD RIGHT ASSIST CONTROL    Special Requests      BOTTLES DRAWN AEROBIC AND ANAEROBIC Blood Culture adequate volume   Culture      NO GROWTH < 12 HOURS Performed at [facility], [address] [place], [place] [address]    Report Status PENDING   CULTURE, BLOOD (ROUTINE X  2) w Reflex to ID Panel     Status: None (Preliminary result)   Collection Time: 01/06/21 12:25 AM   Specimen: BLOOD  Result Value Ref Range   Specimen Description BLOOD LEFT ASSIST CONTROL    Special Requests      BOTTLES DRAWN AEROBIC ONLY Blood Culture adequate volume   Culture      NO GROWTH < 12 HOURS Performed at [facility], [address] [place], [place] [address]    Report Status PENDING   Glucose, capillary     Status: Abnormal   Collection Time: 01/06/21  8:37 AM  Result Value Ref Range   Glucose-Capillary 272 (H) 70 - 99 mg/dL    Comment: Glucose reference range applies only to samples taken after fasting for at least 8 hours.  Glucose, capillary     Status: Abnormal   Collection Time: 01/06/21 11:28 AM  Result Value Ref Range   Glucose-Capillary 238 (H) 70 - 99 mg/dL    Comment: Glucose reference range applies only to samples taken after fasting for at least 8 hours.   PERIPHERAL VASCULAR CATHETERIZATION  Result Date: 01/04/2021 See surgical note for result.   Blood pressure 116/81, pulse (!) 132, temperature 98.7 F (37.1 C), temperature source Oral, resp. rate 19, height '6\' 4"'$  (1.93 m), weight (!) 159.9 kg, SpO2 96 %.  Assessment Sepsis secondary to cellulitis left foot secondary to chronic open ulceration  left fifth metatarsal phalangeal joint plantar Diabetes type 2 polyneuropathy PVD Equinus bilateral Noncompliance with medical treatment  Plan -Patient seen and examined. -X-ray imaging and MRI imaging reviewed and discussed with patient and patient family in detail.  Does not appear to show osteomyelitis on the MRI.  Appears show diffuse cellulitis though throughout the left foot and swelling. -Swelling and cellulitic changes appear to be improved to the left foot today.  No bone exposed through the area of the ulcerative site. -Wound culture growing gram-positive cocci and gram-positive rods.  Patient's blood culture also positive for strep.  Infectious disease has been consulted, appreciate recommendations for antibiotic therapy. -Unfortunately patient surgery had to be delayed due to new onset acute A. fib, unstable.  Appreciate cardiology recommendations and patient will need cardiology clearance prior to any surgical intervention. -Patient's foot remains stable at this time so surgery is not urgently needed at this time.  Would still recommend potentially proceeding with procedure early next week after patient has had full cardiology work-up and treatment. -Patient is still try to stay at the left foot is much as possible with heel contact and surgical shoe and dressing orders have been placed for changes every other day. -We will likely follow back up with the patient on Monday after further cardiology work-up.  Caroline More, DPM 01/06/2021, 1:28 PM

## 2021-01-06 NOTE — Progress Notes (Signed)
Inpatient Diabetes Program Recommendations  AACE/ADA: New Consensus Statement on Inpatient Glycemic Control   Target Ranges:  Prepandial:   less than 140 mg/dL      Peak postprandial:   less than 180 mg/dL (1-2 hours)      Critically ill patients:  140 - 180 mg/dL   Results for [name], [name]" (MRN [id number]) as of 01/06/2021 09:44  Ref. Range 01/05/2021 08:18 01/05/2021 11:46 01/05/2021 17:33 01/05/2021 21:02 01/06/2021 08:37  Glucose-Capillary Latest Ref Range: 70 - 99 mg/dL 271 (H) 351 (H) 399 (H) 389 (H) 272 (H)    Review of Glycemic Control  Diabetes history: DM2 Outpatient Diabetes medications: Lantus 25 units QHS, Novolog 18 units TID with meals, Metformin 1000 mg BID, Ozempic 1 mg Qweek Current orders for Inpatient glycemic control: Semglee 15 units BID, Novolog 4 units TID with meals, Novolog 0-15 units TID with meals, Novolog 0-5 units QHS   Inpatient Diabetes Program Recommendations:     Insulin: Please consider increasing Semglee to 20 units BID and meal coverage to Novolog 8 units TID with meals.   Thanks, Barnie Alderman, RN, MSN, CDE Diabetes Coordinator Inpatient Diabetes Program [phone] (Team Pager from 8am to 5pm)

## 2021-01-06 NOTE — Progress Notes (Addendum)
PROGRESS NOTE    [name].  JR:[id number] DOB: [birth year] DOA: 01/03/2021 PCP: [name], MD   Brief Narrative: Taken from H&P. Alec A Markelle Nelli. is a 65 y.o. male with medical history significant of hypertension, heart failure, hyperlipidemia, diabetes presents with fever and chills.  Patient had a chronic ulcer beneath the fifth metatarsal of left foot, blood cultures growing Streptococcus.  MRI negative for osteomyelitis. Antibiotic switched with ceftriaxone. Podiatry is involved and he will be going for fifth ray amputation on Friday with Dr. Luana Shu Patient also underwent angiography with vascular surgery which was negative for any significant stenosis, up on the occlusion of anterior tibial artery proximally without good reconstitution distally.  9/8: Developed new onset A. fib with RVR overnight.  Failed metoprolol and diltiazem and started on amiodarone infusion by nighttime provider.  Cardiology was also consulted.  9/9: Patient's wife was very upset this morning stating that we are responsible for her husband's condition.  She was upset as pharmacy told her that his home heart medications were not started on admission and that is the reason his heart rate is up.  Tried explaining that he has a new onset A. fib with RVR, we have already started home metoprolol yesterday morning which was held on admission due to softer blood pressure.  She also wants to start Imdur and losartan, tried explaining multiple times that is not in the best interest of patient at this time due to softer blood pressure as he presented with sepsis.  She does continue to blame that you are responsible and I am going to sue everyone if anything happens to my husband. She was also accusing me that I am liar as I told them there is an infection and most likely the sources his foot wound, stating that infectious disease told that source most likely is the blister on the foot not the ulcer.   Apparently podiatry told her that there is no infection??  Tried explaining bacteremia and the possible source but she was keeps shouting that she is not stupid and I am keep lying with them.  She does not want to see me anymore.  Heart rate remained elevated.  Cardiology was consulted for new onset A. fib with RVR, they are more fixated on TEE to rule out endocarditis despite sending the message that per ID group G strep are low risk for endocarditis unless patient has an artificial valve.  Apparently do not want to try cardioversion with TEE either, no clear communication at this time.  His foot surgery got canceled till Monday because of continuation of A. fib with RVR.  Subjective: Patient was seen and examined today.  Denies any chest pain but he and his wife was very frustrated with the care he was getting here.  Assessment & Plan:   Principal Problem:   Sepsis due to cellulitis [facility]) Active Problems:   Diabetes mellitus type 2 in obese (HCC)   CAD (coronary artery disease)   CAD (coronary artery disease), autologous vein bypass graft   Pure hypercholesterolemia   RLS (restless legs syndrome)   Chronic osteomyelitis of left foot (HCC)   Osteomyelitis (HCC)  Sepsis/strep bacteremia/left foot cellulitis and chronic ulcer.  MRI without signs of osteomyelitis but did show extensive cellulitis. Blood cultures growing group G Streptococcus with good sensitivity.   Wound cultures with gram-positive cocci and rods-final results pending Repeat blood cultures done today negative in 12 hours. Patient with chronic ulcers, history of third ray  amputation a year before.  Current ulcer of about 68 to 66-monthold at the base of fifth metatarsal, patient follow-up with podiatry and wound clinic.  Multiple blisters with concern of surrounding cellulitis and signs of chronic venous stasis lower extremities. Angiography with out any significant stenosis-please see vascular report Initially started on  vancomycin and Zosyn and later switched with ceftriaxone Podiatry was consulted-patient underwent debridement. Patient was supposed to go for fifth metatarsal head amputation today which was canceled due to persistent A. fib with RVR.  Most likely will be done on Monday now -ID was also consulted -TTE was without any significant abnormality. -Per ID no need for TEE as patient does not have any artificial valve.  New onset A. fib with RVR.  Patient failed metoprolol and diltiazem overnight.  He was started on amiodarone infusion by the nighttime provider.  Cardiology was also consulted. Heart rate remained elevated despite being on amiodarone infusion. -Continue with amiodarone infusion at this time. -Cardiology will be managing his A. fib with RVR -Already on Eliquis-we will continue  Type 2 diabetes mellitus.  A1c of 8.9.  CBG remained elevated. -Increase Lantus to 20 units twice daily-he was using 25 units twice daily at home. -Continue 4 units with meals -Continue with SSI.  History of CAD.  No chest pain. -Continue home dose of Plavix and isosorbide. -Continue with statin  Hypertension.  Blood pressure within goal. -Continue with losartan  BPH. -Continue with Flomax  Morbid obesity. Estimated body mass index is 42.92 kg/m as calculated from the following:   Height as of this encounter: '6\' 4"'$  (1.93 m).   Weight as of this encounter: 159.9 kg.  This will complicate overall prognosis.  Objective: Vitals:   01/06/21 0755 01/06/21 1007 01/06/21 1116 01/06/21 1538  BP: 123/73  116/81 117/61  Pulse: (!) 110 (!) 132  (!) 110  Resp: '17  19 17  '$ Temp: 98.9 F (37.2 C)  98.7 F (37.1 C) 98.5 F (36.9 C)  TempSrc: Oral  Oral Oral  SpO2: 95%  96% 96%  Weight:      Height:        Intake/Output Summary (Last 24 hours) at 01/06/2021 1542 Last data filed at 01/06/2021 1539 Gross per 24 hour  Intake 1349.54 ml  Output 1750 ml  Net -400.46 ml    Filed Weights   01/04/21 1703  01/05/21 0412 01/06/21 0600  Weight: (!) 152 kg (!) 161.3 kg (!) 159.9 kg    Examination:  General.  Morbidly obese gentleman, in no acute distress. Pulmonary.  Lungs clear bilaterally, normal respiratory effort. CV.  Regular rate and rhythm, no JVD, rub or murmur. Abdomen.  Soft, nontender, nondistended, BS positive. CNS.  Alert and oriented .  No focal neurologic deficit. Extremities.  1+ LE edema with signs of chronic venous congestion and some blistering, left foot with Ace wrap. Psychiatry.  Judgment and insight appears normal.   DVT prophylaxis: Eliquis Code Status: Full Family Communication: Discussed with patient and wife at bedside. Disposition Plan:  Status is: Inpatient  Remains inpatient appropriate because:Inpatient level of care appropriate due to severity of illness  Dispo: The patient is from: Home              Anticipated d/c is to: Home              Patient currently is not medically stable to d/c.   Difficult to place patient No  Level of care: Progressive Cardiac  All the records are reviewed and case discussed with Care Management/Social Worker. Management plans discussed with the patient, nursing and they are in agreement.  Consultants:  Podiatry Cardiology ID  Procedures:  Antimicrobials:  Ceftriaxone  Data Reviewed: I have personally reviewed following labs and imaging studies  CBC: Recent Labs  Lab 01/03/21 1726 01/04/21 0638 01/05/21 0726  WBC 11.4* 12.0* 7.3  NEUTROABS 10.5* 11.2* 5.6  HGB 14.6 13.4 14.2  HCT 43.7 40.7 43.8  MCV 91.2 91.1 93.4  PLT 150 148* 143*    Basic Metabolic Panel: Recent Labs  Lab 01/03/21 1726 01/04/21 0638 01/05/21 0726  NA 135 134* 135  K 4.4 3.8 4.1  CL 99 100 100  CO2 '27 26 27  '$ GLUCOSE 269* 317* 325*  BUN '21 21 19  '$ CREATININE 0.85 0.98 0.97  CALCIUM 8.8* 8.5* 8.1*  MG  --   --  2.0    GFR: Estimated Creatinine Clearance: 124.6 mL/min (by C-G formula based on SCr of 0.97  mg/dL). Liver Function Tests: Recent Labs  Lab 01/03/21 1726 01/04/21 0638 01/05/21 0726  AST 18 12* 13*  ALT '16 11 13  '$ ALKPHOS 103 87 78  BILITOT 0.6 0.7 0.5  PROT 7.3 6.5 6.4*  ALBUMIN 3.4* 2.9* 2.8*    No results for input(s): LIPASE, AMYLASE in the last 168 hours. No results for input(s): AMMONIA in the last 168 hours. Coagulation Profile: Recent Labs  Lab 01/03/21 1726 01/05/21 0726  INR 1.0 1.2    Cardiac Enzymes: No results for input(s): CKTOTAL, CKMB, CKMBINDEX, TROPONINI in the last 168 hours. BNP (last 3 results) No results for input(s): PROBNP in the last 8760 hours. HbA1C: Recent Labs    01/04/21 0638  HGBA1C 8.9*    CBG: Recent Labs  Lab 01/05/21 1146 01/05/21 1733 01/05/21 2102 01/06/21 0837 01/06/21 1128  GLUCAP 351* 399* 389* 272* 238*    Lipid Profile: No results for input(s): CHOL, HDL, LDLCALC, TRIG, CHOLHDL, LDLDIRECT in the last 72 hours. Thyroid Function Tests: No results for input(s): TSH, T4TOTAL, FREET4, T3FREE, THYROIDAB in the last 72 hours. Anemia Panel: No results for input(s): VITAMINB12, FOLATE, FERRITIN, TIBC, IRON, RETICCTPCT in the last 72 hours. Sepsis Labs: Recent Labs  Lab 01/03/21 1718 01/03/21 1927  LATICACIDVEN 1.7 1.1     Recent Results (from the past 240 hour(s))  Blood culture (routine single)     Status: Abnormal (Preliminary result)   Collection Time: 01/03/21  5:37 PM   Specimen: BLOOD  Result Value Ref Range Status   Specimen Description   Final    BLOOD BLOOD RIGHT FOREARM Performed at [facility], [address], [place], [place] [address]    Special Requests   Final    BOTTLES DRAWN AEROBIC AND ANAEROBIC Blood Culture adequate volume Performed at [facility], [address] [place] [address]    Culture  Setup Time   Final    GRAM POSITIVE COCCI AEROBIC BOTTLE ONLY CRITICAL RESULT CALLED TO, READ BACK BY AND VERIFIED WITH: Coralie Carpen 01/04/21 [place] Performed at [facility], [place] [address], [place], [place] [address]    Culture STREPTOCOCCUS GROUP G (A)  Final   Report Status PENDING  Incomplete   Organism ID, Bacteria STREPTOCOCCUS GROUP G  Final      Susceptibility   Streptococcus group g - MIC*    CLINDAMYCIN <=0.25 SENSITIVE Sensitive     AMPICILLIN <=0.25 SENSITIVE Sensitive  ERYTHROMYCIN <=0.12 SENSITIVE Sensitive     VANCOMYCIN 0.5 SENSITIVE Sensitive     CEFTRIAXONE <=0.12 SENSITIVE Sensitive     LEVOFLOXACIN 0.5 SENSITIVE Sensitive     PENICILLIN Value in next row Sensitive      SENSITIVEMIC =0.06S    * STREPTOCOCCUS GROUP G  Blood Culture ID Panel (Reflexed)     Status: Abnormal   Collection Time: 01/03/21  5:37 PM  Result Value Ref Range Status   Enterococcus faecalis NOT DETECTED NOT DETECTED Final   Enterococcus Faecium NOT DETECTED NOT DETECTED Final   Listeria monocytogenes NOT DETECTED NOT DETECTED Final   Staphylococcus species NOT DETECTED NOT DETECTED Final   Staphylococcus aureus (BCID) NOT DETECTED NOT DETECTED Final   Staphylococcus epidermidis NOT DETECTED NOT DETECTED Final   Staphylococcus lugdunensis NOT DETECTED NOT DETECTED Final   Streptococcus species DETECTED (A) NOT DETECTED Final    Comment: Not Enterococcus species, Streptococcus agalactiae, Streptococcus pyogenes, or Streptococcus pneumoniae. CRITICAL RESULT CALLED TO, READ BACK BY AND VERIFIED WITH: SAMANTHA RAUER 01/04/21 0749 KLW    Streptococcus agalactiae NOT DETECTED NOT DETECTED Final   Streptococcus pneumoniae NOT DETECTED NOT DETECTED Final   Streptococcus pyogenes NOT DETECTED NOT DETECTED Final   A.calcoaceticus-baumannii NOT DETECTED NOT DETECTED Final   Bacteroides fragilis NOT DETECTED NOT DETECTED Final   Enterobacterales NOT DETECTED NOT DETECTED Final   Enterobacter cloacae complex NOT DETECTED NOT DETECTED Final   Escherichia coli NOT DETECTED NOT DETECTED Final   Klebsiella aerogenes NOT DETECTED NOT  DETECTED Final   Klebsiella oxytoca NOT DETECTED NOT DETECTED Final   Klebsiella pneumoniae NOT DETECTED NOT DETECTED Final   Proteus species NOT DETECTED NOT DETECTED Final   Salmonella species NOT DETECTED NOT DETECTED Final   Serratia marcescens NOT DETECTED NOT DETECTED Final   Haemophilus influenzae NOT DETECTED NOT DETECTED Final   Neisseria meningitidis NOT DETECTED NOT DETECTED Final   Pseudomonas aeruginosa NOT DETECTED NOT DETECTED Final   Stenotrophomonas maltophilia NOT DETECTED NOT DETECTED Final   Candida albicans NOT DETECTED NOT DETECTED Final   Candida auris NOT DETECTED NOT DETECTED Final   Candida glabrata NOT DETECTED NOT DETECTED Final   Candida krusei NOT DETECTED NOT DETECTED Final   Candida parapsilosis NOT DETECTED NOT DETECTED Final   Candida tropicalis NOT DETECTED NOT DETECTED Final   Cryptococcus neoformans/gattii NOT DETECTED NOT DETECTED Final    Comment: Performed at [facility], [address] [place], [place] [address]  Resp Panel by RT-PCR (Flu A&B, Covid) Nasopharyngeal Swab     Status: None   Collection Time: 01/03/21  6:24 PM   Specimen: Nasopharyngeal Swab; Nasopharyngeal(NP) swabs in vial transport medium  Result Value Ref Range Status   SARS Coronavirus 2 by RT PCR NEGATIVE NEGATIVE Final    Comment: (NOTE) SARS-CoV-2 target nucleic acids are NOT DETECTED.  The SARS-CoV-2 RNA is generally detectable in upper respiratory specimens during the acute phase of infection. The lowest concentration of SARS-CoV-2 viral copies this assay can detect is 138 copies/mL. A negative result does not preclude SARS-Cov-2 infection and should not be used as the sole basis for treatment or other patient management decisions. A negative result may occur with  improper specimen collection/handling, submission of specimen other than nasopharyngeal swab, presence of viral mutation(s) within the areas targeted by this assay, and inadequate number of  viral copies(<138 copies/mL). A negative result must be combined with clinical observations, patient history, and epidemiological information. The expected result is Negative.  Fact Sheet for  Patients:  EntrepreneurPulse.com.au  Fact Sheet for Healthcare Providers:  IncredibleEmployment.be  This test is no t yet approved or cleared by the [place] FDA and  has been authorized for detection and/or diagnosis of SARS-CoV-2 by FDA under an Emergency Use Authorization (EUA). This EUA will remain  in effect (meaning this test can be used) for the duration of the COVID-19 declaration under Section 564(b)(1) of the Act, 21 U.S.C.section 360bbb-3(b)(1), unless the authorization is terminated  or revoked sooner.       Influenza A by PCR NEGATIVE NEGATIVE Final   Influenza B by PCR NEGATIVE NEGATIVE Final    Comment: (NOTE) The Xpert Xpress SARS-CoV-2/FLU/RSV plus assay is intended as an aid in the diagnosis of influenza from Nasopharyngeal swab specimens and should not be used as a sole basis for treatment. Nasal washings and aspirates are unacceptable for Xpert Xpress SARS-CoV-2/FLU/RSV testing.  Fact Sheet for Patients: EntrepreneurPulse.com.au  Fact Sheet for Healthcare Providers: IncredibleEmployment.be  This test is not yet approved or cleared by the [place] FDA and has been authorized for detection and/or diagnosis of SARS-CoV-2 by FDA under an Emergency Use Authorization (EUA). This EUA will remain in effect (meaning this test can be used) for the duration of the COVID-19 declaration under Section 564(b)(1) of the Act, 21 U.S.C. section 360bbb-3(b)(1), unless the authorization is terminated or revoked.  Performed at [facility], [address] [place], [place] [address]   Blood culture (single)     Status: Abnormal   Collection Time: 01/03/21  7:15 PM   Specimen: BLOOD  Result  Value Ref Range Status   Specimen Description   Final    BLOOD BLOOD LEFT HAND Performed at [facility], [address] [place], [place] [address]    Special Requests   Final    BOTTLES DRAWN AEROBIC AND ANAEROBIC Blood Culture results may not be optimal due to an excessive volume of blood received in culture bottles Performed at [facility], [address] [place], [place] [address]    Culture  Setup Time   Final    GRAM POSITIVE COCCI ANAEROBIC BOTTLE ONLY CRITICAL RESULT CALLED TO, READ BACK BY AND VERIFIED WITH: Coralie Carpen 01/04/21 [place] Performed at [facility], [address] [place], [place] [address]    Culture (A)  Final    STREPTOCOCCUS GROUP G SUSCEPTIBILITIES PERFORMED ON PREVIOUS CULTURE WITHIN THE LAST 5 DAYS. Performed at [facility], [place] [address] [place], [place] [address]    Report Status 01/06/2021 FINAL  Final  MRSA Next Gen by PCR, Nasal     Status: None   Collection Time: 01/04/21  7:52 AM   Specimen: Nasal Mucosa; Nasal Swab  Result Value Ref Range Status   MRSA by PCR Next Gen NOT DETECTED NOT DETECTED Final    Comment: (NOTE) The GeneXpert MRSA Assay (FDA approved for NASAL specimens only), is one component of a comprehensive MRSA colonization surveillance program. It is not intended to diagnose MRSA infection nor to guide or monitor treatment for MRSA infections. Test performance is not FDA approved in patients less than 84 years old. Performed at [facility], [address] [place], [place] [address]   Aerobic/Anaerobic Culture w Gram Stain (surgical/deep wound)     Status: None (Preliminary result)   Collection Time: 01/04/21 12:39 PM   Specimen: Foot; Wound  Result Value Ref Range Status   Specimen Description   Final    FOOT LEFT Performed  at [facility], [address], [place], [place] [address]    Special Requests   Final    NONE Performed at [facility],  [place], [place] [address]    Gram Stain   Final    FEW SQUAMOUS EPITHELIAL CELLS PRESENT FEW WBC SEEN MODERATE GRAM POSITIVE COCCI FEW GRAM POSITIVE RODS Performed at [facility], [place] [address], [place], [place] [address]    Culture   Final    CULTURE REINCUBATED FOR BETTER GROWTH NO ANAEROBES ISOLATED; CULTURE IN PROGRESS FOR 5 DAYS    Report Status PENDING  Incomplete  CULTURE, BLOOD (ROUTINE X 2) w Reflex to ID Panel     Status: None (Preliminary result)   Collection Time: 01/06/21 12:23 AM   Specimen: BLOOD  Result Value Ref Range Status   Specimen Description BLOOD RIGHT ASSIST CONTROL  Final   Special Requests   Final    BOTTLES DRAWN AEROBIC AND ANAEROBIC Blood Culture adequate volume   Culture   Final    NO GROWTH < 12 HOURS Performed at [facility], [address] [place], [place] [address]    Report Status PENDING  Incomplete  CULTURE, BLOOD (ROUTINE X 2) w Reflex to ID Panel     Status: None (Preliminary result)   Collection Time: 01/06/21 12:25 AM   Specimen: BLOOD  Result Value Ref Range Status   Specimen Description BLOOD LEFT ASSIST CONTROL  Final   Special Requests   Final    BOTTLES DRAWN AEROBIC ONLY Blood Culture adequate volume   Culture   Final    NO GROWTH < 12 HOURS Performed at [facility], [address], [place], [place] [address]    Report Status PENDING  Incomplete      Radiology Studies: PERIPHERAL VASCULAR CATHETERIZATION  Result Date: 01/04/2021 See surgical note for result.   Scheduled Meds:  amiodarone  400 mg Oral Daily   apixaban  5 mg Oral BID   atorvastatin  20 mg Oral QHS   chlorhexidine  60 mL Topical Once   clopidogrel  75 mg Oral Daily   ezetimibe  10 mg Oral QHS   insulin aspart  0-15 Units Subcutaneous TID WC   insulin aspart  0-5 Units Subcutaneous QHS   insulin aspart  4 Units Subcutaneous TID WC   insulin glargine-yfgn  20 Units Subcutaneous BID   metoprolol tartrate   50 mg Oral Q8H   povidone-iodine  2 application Topical Once   tamsulosin  0.4 mg Oral Daily   Continuous Infusions:  sodium chloride     ampicillin-sulbactam (UNASYN) IV 3 g (01/06/21 1326)     LOS: 3 days   Time spent: 50 minutes. More than 50% of the time was spent in counseling/coordination of care  Lorella Nimrod, MD [facility]  If 7PM-7AM, please contact night-coverage [email or web address]  01/06/2021, 3:42 PM   This record has been created using Systems analyst. Errors have been sought and corrected,but may not always be located. Such creation errors do not reflect on the standard of care.

## 2021-01-06 NOTE — Progress Notes (Signed)
  DOA 01/03/21   Subjective: Pt feels okay He does not c/o palpitations He has no fever No sob  Medications:   apixaban  5 mg Oral BID   atorvastatin  20 mg Oral QHS   chlorhexidine  60 mL Topical Once   clopidogrel  75 mg Oral Daily   ezetimibe  10 mg Oral QHS   insulin aspart  0-15 Units Subcutaneous TID WC   insulin aspart  0-5 Units Subcutaneous QHS   insulin aspart  4 Units Subcutaneous TID WC   insulin glargine-yfgn  20 Units Subcutaneous BID   metoprolol tartrate  50 mg Oral BID   povidone-iodine  2 application Topical Once   tamsulosin  0.4 mg Oral Daily    Objective: Vital signs in last 24 hours: Temp:  [98.2 F (36.8 C)-98.9 F (37.2 C)] 98.7 F (37.1 C) (09/09 1116) Pulse Rate:  [87-132] 132 (09/09 1007) Resp:  [17-24] 19 (09/09 1116) BP: (116-142)/(61-90) 116/81 (09/09 1116) SpO2:  [95 %-99 %] 96 % (09/09 1116) Weight:  [159.9 kg] 159.9 kg (09/09 0600)  PHYSICAL EXAM:  General: Alert, cooperative, no distress, increased BMI Head: Normocephalic, without obvious abnormality, atraumatic. Eyes: Conjunctivae clear, anicteric sclerae. Pupils are equal ENT Nares normal. No drainage or sinus tenderness. Lips, mucosa, and tongue normal. No Thrush Neck: Supple, symmetrical, no adenopathy, thyroid: non tender Lungs: b/l air entry Heart:AFIB with RVR. Abdomen: Soft, non-tender,not distended. Bowel sounds normal. No masses Extremities: venous edema legs Scabbed deroofed blisters       Left foot- lateral side ulcer    Skin: No rashes or lesions. Or bruising Lymph: Cervical, supraclavicular normal. Neurologic: Grossly non-focal  Lab Results Recent Labs    01/04/21 0638 01/05/21 0726  WBC 12.0* 7.3  HGB 13.4 14.2  HCT 40.7 43.8  NA 134* 135  K 3.8 4.1  CL 100 100  CO2 26 27  BUN 21 19  CREATININE 0.98 0.97   Liver Panel Recent Labs    01/04/21 0638 01/05/21 0726  PROT 6.5 6.4*  ALBUMIN 2.9* 2.8*  AST 12* 13*  ALT 11 13  ALKPHOS 87 63   BILITOT 0.7 0.5  Microbiology: 01/03/21 2 sets of BC- Streptococcus Group G bacteremia 01/04/21 WC- gram positive cocci 9/9 22 BC  sent     Assessment/Plan: Group G streptococcus bacteremia- source could be the left foot ulcer- the venous edema and b/l blistering with oozing legs could be  source as well- it is better with rest Ceftriaxone changed to ampicillin/Sulbactam 2 d echo shows normal mitral and aortic valve Suspicion for endocarditis is low . But if TEE is required for cardiac reason it is fine to get it  Chronic left foot ulcer- surgery for met head resection when stable  AFIB with RVR- pt on Amiodrone, digoxin, diltiazem and metoprolol ( There is a past h/o AFIB mentioned in Dr.Fath's note)   CAD s/p CABG had stent to RCA last year- on plavix Eliquis   DM on insulin  H/o DFI-left foot  2nd toe amputation last year- with MRSA/GBS_ and treated appropriately   Discussed the management with the patient and his wife Discussed with hospitalist I will be awy until 01/11/21- [facility] Physician covering by phone. Please call if needed

## 2021-01-06 NOTE — Progress Notes (Signed)
[facility]    SUBJECTIVE: Patient states to be doing okay denies any chest pain no shortness of breath no fever no tachycardia still awaiting surgery for his left leg ulcer.  TEE was deferred.Marland Kitchen  Hopefully pursue toe surgery on Monday.   Vitals:   01/06/21 0600 01/06/21 0755 01/06/21 1007 01/06/21 1116  BP:  123/73  116/81  Pulse:  (!) 110 (!) 132   Resp:  17  19  Temp:  98.9 F (37.2 C)  98.7 F (37.1 C)  TempSrc:  Oral  Oral  SpO2:  95%  96%  Weight: (!) 159.9 kg     Height:         Intake/Output Summary (Last 24 hours) at 01/06/2021 1416 Last data filed at 01/06/2021 1300 Gross per 24 hour  Intake 1109.54 ml  Output 1750 ml  Net -640.46 ml      PHYSICAL EXAM  General: Well developed, well nourished, in no acute distress HEENT:  Normocephalic and atramatic Neck:  No JVD.  Lungs: Clear bilaterally to auscultation and percussion. Heart: HRRR . Normal S1 and S2 without gallops or murmurs.  Abdomen: Bowel sounds are positive, abdomen soft and non-tender  Msk:  Back normal, normal gait. Normal strength and tone for age. Extremities: No clubbing, cyanosis or edema.   Neuro: Alert and oriented X 3. Psych:  Good affect, responds appropriately   LABS: Basic Metabolic Panel: Recent Labs    01/04/21 0638 01/05/21 0726  NA 134* 135  K 3.8 4.1  CL 100 100  CO2 26 27  GLUCOSE 317* 325*  BUN 21 19  CREATININE 0.98 0.97  CALCIUM 8.5* 8.1*  MG  --  2.0   Liver Function Tests: Recent Labs    01/04/21 0638 01/05/21 0726  AST 12* 13*  ALT 11 13  ALKPHOS 87 78  BILITOT 0.7 0.5  PROT 6.5 6.4*  ALBUMIN 2.9* 2.8*   No results for input(s): LIPASE, AMYLASE in the last 72 hours. CBC: Recent Labs    01/04/21 0638 01/05/21 0726  WBC 12.0* 7.3  NEUTROABS 11.2* 5.6  HGB 13.4 14.2  HCT 40.7 43.8  MCV 91.1 93.4  PLT 148* 143*   Cardiac Enzymes: No results for input(s): CKTOTAL, CKMB, CKMBINDEX, TROPONINI in the last 72 hours. BNP: Invalid input(s):  POCBNP D-Dimer: No results for input(s): DDIMER in the last 72 hours. Hemoglobin A1C: Recent Labs    01/04/21 0638  HGBA1C 8.9*   Fasting Lipid Panel: No results for input(s): CHOL, HDL, LDLCALC, TRIG, CHOLHDL, LDLDIRECT in the last 72 hours. Thyroid Function Tests: No results for input(s): TSH, T4TOTAL, T3FREE, THYROIDAB in the last 72 hours.  Invalid input(s): FREET3 Anemia Panel: No results for input(s): VITAMINB12, FOLATE, FERRITIN, TIBC, IRON, RETICCTPCT in the last 72 hours.  PERIPHERAL VASCULAR CATHETERIZATION  Result Date: 01/04/2021 See surgical note for result.    TELEMETRY: Atrial fibrillation tachycardia rapid ventricular response:  ASSESSMENT AND PLAN:  Principal Problem:   Sepsis due to cellulitis [facility]) Active Problems:   Diabetes mellitus type 2 in obese (HCC)   CAD (coronary artery disease)   CAD (coronary artery disease), autologous vein bypass graft   Pure hypercholesterolemia   RLS (restless legs syndrome)   Chronic osteomyelitis of left foot (HCC)   Osteomyelitis (HCC)    Plan Chronic osteomyelitis left foot chronic ulcer Abscess related to chronic leg infection Atrial fibrillation rapid ventricular response recommend increase rate control medications digoxin amiodarone metoprolol Cardizem Diabetes type 2 uncomplicated continue current therapy Obesity  recommend weight loss exercise portion control anticoagulation for atrial fibrillation until surgery Patient preop for foot surgery secondary to osteomyelitis and sepsis Continue rate control for atrial fibrillation Continue telemetry follow-up EKGs control Defer TEE at this point    Yolonda Kida, MD, 01/06/2021 2:16 PM

## 2021-01-06 NOTE — Progress Notes (Signed)
Pt started with Afib RVR-MD notified, given lopressor IV at 0437 and started cardizem drip at 0540-drip help at 0642 due to drop in bp-IV bolus given and amiodarone drip started at [place]

## 2021-01-06 NOTE — Care Management Important Message (Signed)
Important Message  Patient Details  Name: [name]. MRN: [id number] Date of Birth: [birth year]   Medicare Important Message Given:  Yes     [name] 01/06/2021, 3:25 PM

## 2021-01-06 NOTE — Consult Note (Signed)
Patient and family member were given the Freestyle libre to replace their old one due to needing a MRI. Asked them to put in on once the patient is discharged from the hospital in case any imaging is needed.   Eleonore Chiquito, PharmD, BCPS

## 2021-01-07 DIAGNOSIS — A419 Sepsis, unspecified organism: Secondary | ICD-10-CM | POA: Diagnosis not present

## 2021-01-07 DIAGNOSIS — L0390 Cellulitis, unspecified: Secondary | ICD-10-CM | POA: Diagnosis not present

## 2021-01-07 LAB — GLUCOSE, CAPILLARY
Glucose-Capillary: 258 mg/dL — ABNORMAL HIGH (ref 70–99)
Glucose-Capillary: 301 mg/dL — ABNORMAL HIGH (ref 70–99)
Glucose-Capillary: 315 mg/dL — ABNORMAL HIGH (ref 70–99)
Glucose-Capillary: 286 mg/dL — ABNORMAL HIGH (ref 70–99)

## 2021-01-07 LAB — SEDIMENTATION RATE: Sed Rate: 50 mm/hr — ABNORMAL HIGH (ref 0–20)

## 2021-01-07 MED ORDER — DILTIAZEM HCL-DEXTROSE 125-5 MG/125ML-% IV SOLN (PREMIX)
5.0000 mg/h | INTRAVENOUS | Status: DC
  Administered 2021-01-07: 15 mg/h via INTRAVENOUS
  Administered 2021-01-07: 5 mg/h via INTRAVENOUS
  Administered 2021-01-08: 15 mg/h via INTRAVENOUS
  Filled 2021-01-07 (×3): qty 125

## 2021-01-07 NOTE — Progress Notes (Signed)
PROGRESS NOTE    [name].  [id number] DOB: Jun 30, 1955 DOA: 01/03/2021 PCP: [name], MD   Chief Complain: Fever, chills  Brief Narrative: Patient is a 65 year old male with history of hypertension, congestive heart failure, hyperlipidemia, diabetes type 2, paroxysmal A. fib, chronic ulcer on the left foot presented to the emergency department with complaints of fever, chills.  On presentation he was febrile with temperature of 104, he was tachycardic.  He was found to have worsening ulcer of the left foot.  Started on Rocephin antibiotics, blood culture sent.  Podiatry also consulted.  X-ray of the left foot showed osteomyelitis but MRI was not suggestive of osteomyelitis but showed cellulitis/myositis.  Blood cultures showed Streptococcus, ID consulted, currently on Unasyn.  Hospital course also remarkable for A. fib with RVR, cardiology consulted.  Podiatry planning for fifth ray amputation but delayed due to heart rate.  Assessment & Plan:   Principal Problem:   Sepsis due to cellulitis [facility]) Active Problems:   Diabetes mellitus type 2 in obese (HCC)   CAD (coronary artery disease)   CAD (coronary artery disease), autologous vein bypass graft   Pure hypercholesterolemia   RLS (restless legs syndrome)   Chronic osteomyelitis of left foot (HCC)   Osteomyelitis (HCC)   Left foot cellulitis/left foot chronic ulcer/streptococcal bacteremia: Presented with worsening left foot ulcer.  MRI without signs of osteomyelitis but showed extensive cellulitis/myositis. Blood cultures showed group B streptococcus.  Currently on Unasyn.  ID following.  Repeat blood cultures have been negative. He follows with podiatry as an outpatient.  Being planned for ray amputation after stabilization in heart rate.  No need of TEE as per ID.  TTE did not show any vegetation. Vascular surgery was also following during this hospitalization and he underwent angiogram  New onset A. fib with  RVR: Cardiology following.  Started on amiodarone drip which has been changed to oral.  Still in A. fib with the RVR this morning.  Started on Cardizem drip.  Also on digoxin, metoprolol.  Monitor on telemetry He is on Eliquis for anticoagulation .  Diabetes type 2: Recent hemoglobin of 8.9.  Monitor blood sugars.  Continue current insulin regimen  History of coronary artery disease: No anginal symptoms.  On Plavix, isosorbide at home.  Also on a statin.  Hypertension: Currently stable.  Continue current medications  BPH: On Flomax  Morbid obesity: BMI 43.47         DVT prophylaxis:Eliquis Code Status: Full Family Communication: None at the bedside Status is: Inpatient  Remains inpatient appropriate because:Inpatient level of care appropriate due to severity of illness  Dispo: The patient is from: Home              Anticipated d/c is to: Home              Patient currently is not medically stable to d/c.   Difficult to place patient No      Consultants: Cardiology, ID, podiatry  Procedures: None yet  Antimicrobials:  Anti-infectives (From admission, onward)    Start     Dose/Rate Route Frequency Ordered Stop   01/06/21 1230  Ampicillin-Sulbactam (UNASYN) 3 g in sodium chloride 0.9 % 100 mL IVPB        3 g 200 mL/hr over 30 Minutes Intravenous Every 6 hours 01/06/21 1130     01/05/21 0600  ceFAZolin (ANCEF) IVPB 2g/100 mL premix        2 g 200 mL/hr over 30 Minutes  Intravenous On call to O.R. 01/04/21 1711 01/05/21 0529   01/04/21 1400  cefTRIAXone (ROCEPHIN) 2 g in sodium chloride 0.9 % 100 mL IVPB  Status:  Discontinued        2 g 200 mL/hr over 30 Minutes Intravenous Every 24 hours 01/04/21 0853 01/06/21 1130   01/04/21 1000  vancomycin (VANCOREADY) IVPB 1750 mg/350 mL  Status:  Discontinued        1,750 mg 175 mL/hr over 120 Minutes Intravenous Every 12 hours 01/03/21 2154 01/04/21 0757   01/04/21 1000  vancomycin (VANCOREADY) IVPB 1500 mg/300 mL  Status:   Discontinued        1,500 mg 150 mL/hr over 120 Minutes Intravenous Every 12 hours 01/04/21 0757 01/04/21 0853   01/03/21 2200  piperacillin-tazobactam (ZOSYN) IVPB 3.375 g  Status:  Discontinued        3.375 g 12.5 mL/hr over 240 Minutes Intravenous Every 8 hours 01/03/21 2154 01/04/21 0853   01/03/21 2100  vancomycin (VANCOREADY) IVPB 500 mg/100 mL        500 mg 100 mL/hr over 60 Minutes Intravenous  Once 01/03/21 1848 01/04/21 0019   01/03/21 1900  ceFEPIme (MAXIPIME) 2 g in sodium chloride 0.9 % 100 mL IVPB        2 g 200 mL/hr over 30 Minutes Intravenous  Once 01/03/21 1845 01/03/21 1925   01/03/21 1845  metroNIDAZOLE (FLAGYL) IVPB 500 mg  Status:  Discontinued        500 mg 100 mL/hr over 60 Minutes Intravenous Every 8 hours 01/03/21 1841 01/04/21 0853   01/03/21 1845  vancomycin (VANCOREADY) IVPB 2000 mg/400 mL        2,000 mg 200 mL/hr over 120 Minutes Intravenous  Once 01/03/21 1845 01/03/21 2248       Subjective:  Patient seen and examined at the bedside this morning.  When I evaluated him in the room, he was in A. fib with RVR.  He did not complain of any chest pain, shortness of breath.  Looks comfortable.  He does not have new complaints today. Objective: Vitals:   01/07/21 1053 01/07/21 1059 01/07/21 1115 01/07/21 1131  BP: (!) 148/123 (!) 148/108 (!) 146/135 (!) 145/70  Pulse: (!) 145 (!) 131 (!) 122 (!) 135  Resp: 16  16   Temp:      TempSrc:      SpO2:      Weight:      Height:        Intake/Output Summary (Last 24 hours) at 01/07/2021 1138 Last data filed at 01/07/2021 [id number] Gross per 24 hour  Intake 580 ml  Output 0 ml  Net 580 ml   Filed Weights   01/05/21 0412 01/06/21 0600 01/07/21 0435  Weight: (!) 161.3 kg (!) 159.9 kg (!) 162 kg    Examination:  General exam: Overall comfortable, not in distress,obese HEENT: PERRL Respiratory system:  no wheezes or crackles  Cardiovascular system: A. fib with RVR Gastrointestinal system: Abdomen is  nondistended, soft and nontender. Central nervous system: Alert and oriented Extremities: Left foot wrapped in dressing Skin:   chronic venous stasis on bilateral lower extremities    Data Reviewed: I have personally reviewed following labs and imaging studies  CBC: Recent Labs  Lab 01/03/21 1726 01/04/21 0638 01/05/21 0726  WBC 11.4* 12.0* 7.3  NEUTROABS 10.5* 11.2* 5.6  HGB 14.6 13.4 14.2  HCT 43.7 40.7 43.8  MCV 91.2 91.1 93.4  PLT 150 148* [id number]*   Basic Metabolic Panel:  Recent Labs  Lab 01/03/21 1726 01/04/21 0638 01/05/21 0726  NA 135 134* 135  K 4.4 3.8 4.1  CL 99 100 100  CO2 '27 26 27  '$ GLUCOSE 269* 317* 325*  BUN '21 21 19  '$ CREATININE 0.85 0.98 0.97  CALCIUM 8.8* 8.5* 8.1*  MG  --   --  2.0   GFR: Estimated Creatinine Clearance: 125.5 mL/min (by C-G formula based on SCr of 0.97 mg/dL). Liver Function Tests: Recent Labs  Lab 01/03/21 1726 01/04/21 0638 01/05/21 0726  AST 18 12* 13*  ALT '16 11 13  '$ ALKPHOS 103 87 78  BILITOT 0.6 0.7 0.5  PROT 7.3 6.5 6.4*  ALBUMIN 3.4* 2.9* 2.8*   No results for input(s): LIPASE, AMYLASE in the last 168 hours. No results for input(s): AMMONIA in the last 168 hours. Coagulation Profile: Recent Labs  Lab 01/03/21 1726 01/05/21 0726  INR 1.0 1.2   Cardiac Enzymes: No results for input(s): CKTOTAL, CKMB, CKMBINDEX, TROPONINI in the last 168 hours. BNP (last 3 results) No results for input(s): PROBNP in the last 8760 hours. HbA1C: No results for input(s): HGBA1C in the last 72 hours. CBG: Recent Labs  Lab 01/06/21 0837 01/06/21 1128 01/06/21 1623 01/06/21 2218 01/07/21 0820  GLUCAP 272* 238* 216* 302* 258*   Lipid Profile: No results for input(s): CHOL, HDL, LDLCALC, TRIG, CHOLHDL, LDLDIRECT in the last 72 hours. Thyroid Function Tests: No results for input(s): TSH, T4TOTAL, FREET4, T3FREE, THYROIDAB in the last 72 hours. Anemia Panel: No results for input(s): VITAMINB12, FOLATE, FERRITIN, TIBC, IRON,  RETICCTPCT in the last 72 hours. Sepsis Labs: Recent Labs  Lab 01/03/21 1718 01/03/21 1927  LATICACIDVEN 1.7 1.1    Recent Results (from the past 240 hour(s))  Blood culture (routine single)     Status: Abnormal (Preliminary result)   Collection Time: 01/03/21  5:37 PM   Specimen: BLOOD  Result Value Ref Range Status   Specimen Description   Final    BLOOD BLOOD RIGHT FOREARM Performed at [facility], [address], [place], [place] [address]    Special Requests   Final    BOTTLES DRAWN AEROBIC AND ANAEROBIC Blood Culture adequate volume Performed at [facility], [address] [place], [place] [address]    Culture  Setup Time   Final    GRAM POSITIVE COCCI AEROBIC BOTTLE ONLY CRITICAL RESULT CALLED TO, READ BACK BY AND VERIFIED WITH: Coralie Carpen 01/04/21 [place] Performed at [facility], [place] [address], [place], [place] [address]    Culture STREPTOCOCCUS GROUP G (A)  Final   Report Status PENDING  Incomplete   Organism ID, Bacteria STREPTOCOCCUS GROUP G  Final      Susceptibility   Streptococcus group g - MIC*    CLINDAMYCIN <=0.25 SENSITIVE Sensitive     AMPICILLIN <=0.25 SENSITIVE Sensitive     ERYTHROMYCIN <=0.12 SENSITIVE Sensitive     VANCOMYCIN 0.5 SENSITIVE Sensitive     CEFTRIAXONE <=0.12 SENSITIVE Sensitive     LEVOFLOXACIN 0.5 SENSITIVE Sensitive     PENICILLIN Value in next row Sensitive      SENSITIVEMIC =0.06S    * STREPTOCOCCUS GROUP G  Blood Culture ID Panel (Reflexed)     Status: Abnormal   Collection Time: 01/03/21  5:37 PM  Result Value Ref Range Status   Enterococcus faecalis NOT DETECTED NOT DETECTED Final   Enterococcus Faecium NOT DETECTED NOT DETECTED Final   Listeria monocytogenes NOT DETECTED NOT DETECTED Final  Staphylococcus species NOT DETECTED NOT DETECTED Final   Staphylococcus aureus (BCID) NOT DETECTED NOT DETECTED Final   Staphylococcus epidermidis NOT DETECTED NOT DETECTED Final   Staphylococcus  lugdunensis NOT DETECTED NOT DETECTED Final   Streptococcus species DETECTED (A) NOT DETECTED Final    Comment: Not Enterococcus species, Streptococcus agalactiae, Streptococcus pyogenes, or Streptococcus pneumoniae. CRITICAL RESULT CALLED TO, READ BACK BY AND VERIFIED WITH: SAMANTHA RAUER 01/04/21 0749 KLW    Streptococcus agalactiae NOT DETECTED NOT DETECTED Final   Streptococcus pneumoniae NOT DETECTED NOT DETECTED Final   Streptococcus pyogenes NOT DETECTED NOT DETECTED Final   A.calcoaceticus-baumannii NOT DETECTED NOT DETECTED Final   Bacteroides fragilis NOT DETECTED NOT DETECTED Final   Enterobacterales NOT DETECTED NOT DETECTED Final   Enterobacter cloacae complex NOT DETECTED NOT DETECTED Final   Escherichia coli NOT DETECTED NOT DETECTED Final   Klebsiella aerogenes NOT DETECTED NOT DETECTED Final   Klebsiella oxytoca NOT DETECTED NOT DETECTED Final   Klebsiella pneumoniae NOT DETECTED NOT DETECTED Final   Proteus species NOT DETECTED NOT DETECTED Final   Salmonella species NOT DETECTED NOT DETECTED Final   Serratia marcescens NOT DETECTED NOT DETECTED Final   Haemophilus influenzae NOT DETECTED NOT DETECTED Final   Neisseria meningitidis NOT DETECTED NOT DETECTED Final   Pseudomonas aeruginosa NOT DETECTED NOT DETECTED Final   Stenotrophomonas maltophilia NOT DETECTED NOT DETECTED Final   Candida albicans NOT DETECTED NOT DETECTED Final   Candida auris NOT DETECTED NOT DETECTED Final   Candida glabrata NOT DETECTED NOT DETECTED Final   Candida krusei NOT DETECTED NOT DETECTED Final   Candida parapsilosis NOT DETECTED NOT DETECTED Final   Candida tropicalis NOT DETECTED NOT DETECTED Final   Cryptococcus neoformans/gattii NOT DETECTED NOT DETECTED Final    Comment: Performed at [facility], [address] [place], [place] [address]  Resp Panel by RT-PCR (Flu A&B, Covid) Nasopharyngeal Swab     Status: None   Collection Time: 01/03/21  6:24 PM   Specimen:  Nasopharyngeal Swab; Nasopharyngeal(NP) swabs in vial transport medium  Result Value Ref Range Status   SARS Coronavirus 2 by RT PCR NEGATIVE NEGATIVE Final    Comment: (NOTE) SARS-CoV-2 target nucleic acids are NOT DETECTED.  The SARS-CoV-2 RNA is generally detectable in upper respiratory specimens during the acute phase of infection. The lowest concentration of SARS-CoV-2 viral copies this assay can detect is 138 copies/mL. A negative result does not preclude SARS-Cov-2 infection and should not be used as the sole basis for treatment or other patient management decisions. A negative result may occur with  improper specimen collection/handling, submission of specimen other than nasopharyngeal swab, presence of viral mutation(s) within the areas targeted by this assay, and inadequate number of viral copies(<138 copies/mL). A negative result must be combined with clinical observations, patient history, and epidemiological information. The expected result is Negative.  Fact Sheet for Patients:  EntrepreneurPulse.com.au  Fact Sheet for Healthcare Providers:  IncredibleEmployment.be  This test is no t yet approved or cleared by the [place] FDA and  has been authorized for detection and/or diagnosis of SARS-CoV-2 by FDA under an Emergency Use Authorization (EUA). This EUA will remain  in effect (meaning this test can be used) for the duration of the COVID-19 declaration under Section 564(b)(1) of the Act, 21 U.S.C.section 360bbb-3(b)(1), unless the authorization is terminated  or revoked sooner.       Influenza A by PCR NEGATIVE NEGATIVE Final   Influenza B by PCR NEGATIVE NEGATIVE Final  Comment: (NOTE) The Xpert Xpress SARS-CoV-2/FLU/RSV plus assay is intended as an aid in the diagnosis of influenza from Nasopharyngeal swab specimens and should not be used as a sole basis for treatment. Nasal washings and aspirates are unacceptable for  Xpert Xpress SARS-CoV-2/FLU/RSV testing.  Fact Sheet for Patients: EntrepreneurPulse.com.au  Fact Sheet for Healthcare Providers: IncredibleEmployment.be  This test is not yet approved or cleared by the [place] FDA and has been authorized for detection and/or diagnosis of SARS-CoV-2 by FDA under an Emergency Use Authorization (EUA). This EUA will remain in effect (meaning this test can be used) for the duration of the COVID-19 declaration under Section 564(b)(1) of the Act, 21 U.S.C. section 360bbb-3(b)(1), unless the authorization is terminated or revoked.  Performed at [facility], [address] [place], [place] [address]   Blood culture (single)     Status: Abnormal   Collection Time: 01/03/21  7:15 PM   Specimen: BLOOD  Result Value Ref Range Status   Specimen Description   Final    BLOOD BLOOD LEFT HAND Performed at [facility], [address] [place], [place] [address]    Special Requests   Final    BOTTLES DRAWN AEROBIC AND ANAEROBIC Blood Culture results may not be optimal due to an excessive volume of blood received in culture bottles Performed at [facility], [address] [place], [place] [address]    Culture  Setup Time   Final    GRAM POSITIVE COCCI ANAEROBIC BOTTLE ONLY CRITICAL RESULT CALLED TO, READ BACK BY AND VERIFIED WITH: Coralie Carpen 01/04/21 [place] Performed at [facility], [address] [place], [place] [address]    Culture (A)  Final    STREPTOCOCCUS GROUP G SUSCEPTIBILITIES PERFORMED ON PREVIOUS CULTURE WITHIN THE LAST 5 DAYS. Performed at [facility], [place] [address], [place], [place]  [address]    Report Status 01/06/2021 FINAL  Final  MRSA Next Gen by PCR, Nasal     Status: None   Collection Time: 01/04/21  7:52 AM   Specimen: Nasal Mucosa; Nasal Swab  Result Value Ref Range Status   MRSA by PCR Next Gen NOT DETECTED NOT DETECTED Final     Comment: (NOTE) The GeneXpert MRSA Assay (FDA approved for NASAL specimens only), is one component of a comprehensive MRSA colonization surveillance program. It is not intended to diagnose MRSA infection nor to guide or monitor treatment for MRSA infections. Test performance is not FDA approved in patients less than 41 years old. Performed at [facility], [address] [place], [place] [address]   Aerobic/Anaerobic Culture w Gram Stain (surgical/deep wound)     Status: None (Preliminary result)   Collection Time: 01/04/21 12:39 PM   Specimen: Foot; Wound  Result Value Ref Range Status   Specimen Description   Final    FOOT LEFT Performed at [facility], [address] [place], [place] [address]    Special Requests   Final    NONE Performed at [facility], [place], [place] [address]    Gram Stain   Final    FEW SQUAMOUS EPITHELIAL CELLS PRESENT FEW WBC SEEN MODERATE GRAM POSITIVE COCCI FEW GRAM POSITIVE RODS Performed at [facility], [place] [address] [place], [place] [address]    Culture   Final    CULTURE REINCUBATED FOR BETTER GROWTH NO ANAEROBES ISOLATED; CULTURE IN PROGRESS FOR 5 DAYS    Report Status PENDING  Incomplete  CULTURE, BLOOD (ROUTINE X 2) w Reflex to ID Panel     Status: None (Preliminary result)   Collection Time: 01/06/21 12:23 AM   Specimen: BLOOD  Result Value Ref Range Status   Specimen Description BLOOD RIGHT ASSIST CONTROL  Final   Special Requests   Final    BOTTLES DRAWN AEROBIC AND ANAEROBIC Blood Culture adequate volume   Culture   Final    NO GROWTH 1 DAY Performed at [facility], [address], [place], [place] [address]    Report Status PENDING  Incomplete  CULTURE, BLOOD (ROUTINE X 2) w Reflex to ID Panel     Status: None (Preliminary result)   Collection Time: 01/06/21 12:25 AM   Specimen: BLOOD  Result Value Ref Range Status   Specimen Description BLOOD LEFT ASSIST  CONTROL  Final   Special Requests   Final    BOTTLES DRAWN AEROBIC ONLY Blood Culture adequate volume   Culture   Final    NO GROWTH 1 DAY Performed at [facility], [address] [place], [place] [address]    Report Status PENDING  Incomplete         Radiology Studies: No results found.      Scheduled Meds:  amiodarone  400 mg Oral Daily   apixaban  5 mg Oral BID   atorvastatin  20 mg Oral QHS   chlorhexidine  60 mL Topical Once   clopidogrel  75 mg Oral Daily   digoxin  0.125 mg Intravenous Daily   ezetimibe  10 mg Oral QHS   insulin aspart  0-15 Units Subcutaneous TID WC   insulin aspart  0-5 Units Subcutaneous QHS   insulin aspart  4 Units Subcutaneous TID WC   insulin glargine-yfgn  20 Units Subcutaneous BID   metoprolol tartrate  50 mg Oral Q8H   povidone-iodine  2 application Topical Once   pramipexole  2 mg Oral QHS   primidone  250 mg Oral BID   tamsulosin  0.4 mg Oral Daily   Continuous Infusions:  sodium chloride     ampicillin-sulbactam (UNASYN) IV 3 g (01/07/21 0525)   diltiazem (CARDIZEM) infusion 10 mg/hr (01/07/21 1132)     LOS: 4 days    Time spent:25 mins. More than 50% of that time was spent in counseling and/or coordination of care.      Shelly Coss, MD [facility] P9/01/2021, 11:38 AM

## 2021-01-07 NOTE — Progress Notes (Signed)
[name]    SUBJECTIVE: Patient has been doing reasonably well feels better but still has significant tachycardia rapid atrial fibrillation.  We will continue to advance rate controlling drugs if unsuccessful will consider cardioversion.   Vitals:   01/07/21 0014 01/07/21 0043 01/07/21 0435 01/07/21 0732  BP: 126/82  110/90 106/77  Pulse: (!) 150  (!) 136 (!) 130  Resp: 18 16  (!) 25  Temp: 99.1 F (37.3 C)  97.6 F (36.4 C) 97.7 F (36.5 C)  TempSrc: Oral  Oral Oral  SpO2: 94%  96% 97%  Weight:   (!) 162 kg   Height:         Intake/Output Summary (Last 24 hours) at 01/07/2021 [id number] Last data filed at 01/07/2021 0730 Gross per 24 hour  Intake 580 ml  Output 400 ml  Net 180 ml      PHYSICAL EXAM  General: Well developed, well nourished, in no acute distress HEENT:  Normocephalic and atramatic Neck:  No JVD.  Lungs: Clear bilaterally to auscultation and percussion. Heart: HRRR . Normal S1 and S2 without gallops or murmurs.  Abdomen: Bowel sounds are positive, abdomen soft and non-tender  Msk:  Back normal, normal gait. Normal strength and tone for age. Extremities: No clubbing, cyanosis or edema.   Neuro: Alert and oriented X 3. Psych:  Good affect, responds appropriately   LABS: Basic Metabolic Panel: Recent Labs    01/05/21 0726  NA 135  K 4.1  CL 100  CO2 27  GLUCOSE 325*  BUN 19  CREATININE 0.97  CALCIUM 8.1*  MG 2.0   Liver Function Tests: Recent Labs    01/05/21 0726  AST 13*  ALT 13  ALKPHOS 78  BILITOT 0.5  PROT 6.4*  ALBUMIN 2.8*   No results for input(s): LIPASE, AMYLASE in the last 72 hours. CBC: Recent Labs    01/05/21 0726  WBC 7.3  NEUTROABS 5.6  HGB 14.2  HCT 43.8  MCV 93.4  PLT 143*   Cardiac Enzymes: No results for input(s): CKTOTAL, CKMB, CKMBINDEX, TROPONINI in the last 72 hours. BNP: Invalid input(s): POCBNP D-Dimer: No results for input(s): DDIMER in the last 72 hours. Hemoglobin A1C: No results for  input(s): HGBA1C in the last 72 hours. Fasting Lipid Panel: No results for input(s): CHOL, HDL, LDLCALC, TRIG, CHOLHDL, LDLDIRECT in the last 72 hours. Thyroid Function Tests: No results for input(s): TSH, T4TOTAL, T3FREE, THYROIDAB in the last 72 hours.  Invalid input(s): FREET3 Anemia Panel: No results for input(s): VITAMINB12, FOLATE, FERRITIN, TIBC, IRON, RETICCTPCT in the last 72 hours.  No results found.   Echo preserved left ventricular function EF of 60%  TELEMETRY: Atrial fibrillation rapid ventricular sponsor nonspecific ST-T changes:  ASSESSMENT AND PLAN:  Principal Problem:   Sepsis due to cellulitis [facility]) Active Problems:   Diabetes mellitus type 2 in obese (HCC)   CAD (coronary artery disease)   CAD (coronary artery disease), autologous vein bypass graft   Pure hypercholesterolemia   RLS (restless legs syndrome)   Chronic osteomyelitis of left foot (HCC)   Osteomyelitis (HCC)    Plan Continue to advance rate controlling drugs for A. fib patient rate runs between 110 and 130 Aggressive antibiotic therapy for broad-spectrum for cellulitis of the leg Continue diabetes management and control Currently on amiodarone load metoprolol digoxin diltiazem drip Agree with statin therapy for hyperlipidemia Preop for foot surgery hopefully on Monday Currently on diltiazem drip will hopefully be able to transition to p.o. Borderline hypotension  which is made it difficult to control his rate we will continue current therapy   Alec Kida, MD 01/07/2021 9:07 AM

## 2021-01-07 NOTE — Progress Notes (Signed)
3 Days Post-Op   Subjective/Chief Complaint: Patient seems to be comfortable and is getting around independently.  He denies any fevers or chills.   Objective: Vital signs in last 24 hours: Temp:  [97.6 F (36.4 C)-99.1 F (37.3 C)] 98.2 F (36.8 C) (09/10 1131) Pulse Rate:  [98-150] 121 (09/10 1215) Resp:  [16-25] 16 (09/10 1115) BP: (106-148)/(61-135) 111/98 (09/10 1215) SpO2:  [94 %-97 %] 97 % (09/10 0732) Weight:  [162 kg] 162 kg (09/10 0435) Last BM Date:  (PTA)  Intake/Output from previous day: 09/09 0701 - 09/10 0700 In: 580 [P.O.:480; IV Piggyback:100] Out: 400 [Urine:400] Intake/Output this shift: No intake/output data recorded.  General appearance: alert, cooperative, appears stated age, and no distress Resp: clear to auscultation bilaterally and normal percussion bilaterally Cardio: regular rate and rhythm, S1, S2 normal, no murmur, click, rub or gallop Intact dressings to the lower extremity.  Groin puncture site unremarkable  Lab Results:  Recent Labs    01/05/21 0726  WBC 7.3  HGB 14.2  HCT 43.8  PLT 143*   BMET Recent Labs    01/05/21 0726  NA 135  K 4.1  CL 100  CO2 27  GLUCOSE 325*  BUN 19  CREATININE 0.97  CALCIUM 8.1*   PT/INR Recent Labs    01/05/21 0726  LABPROT 15.0  INR 1.2   ABG No results for input(s): PHART, HCO3 in the last 72 hours.  Invalid input(s): PCO2, PO2  Studies/Results: No results found.  Anti-infectives: Anti-infectives (From admission, onward)    Start     Dose/Rate Route Frequency Ordered Stop   01/06/21 1230  Ampicillin-Sulbactam (UNASYN) 3 g in sodium chloride 0.9 % 100 mL IVPB        3 g 200 mL/hr over 30 Minutes Intravenous Every 6 hours 01/06/21 1130     01/05/21 0600  ceFAZolin (ANCEF) IVPB 2g/100 mL premix        2 g 200 mL/hr over 30 Minutes Intravenous On call to O.R. 01/04/21 1711 01/05/21 0529   01/04/21 1400  cefTRIAXone (ROCEPHIN) 2 g in sodium chloride 0.9 % 100 mL IVPB  Status:   Discontinued        2 g 200 mL/hr over 30 Minutes Intravenous Every 24 hours 01/04/21 0853 01/06/21 1130   01/04/21 1000  vancomycin (VANCOREADY) IVPB 1750 mg/350 mL  Status:  Discontinued        1,750 mg 175 mL/hr over 120 Minutes Intravenous Every 12 hours 01/03/21 2154 01/04/21 0757   01/04/21 1000  vancomycin (VANCOREADY) IVPB 1500 mg/300 mL  Status:  Discontinued        1,500 mg 150 mL/hr over 120 Minutes Intravenous Every 12 hours 01/04/21 0757 01/04/21 0853   01/03/21 2200  piperacillin-tazobactam (ZOSYN) IVPB 3.375 g  Status:  Discontinued        3.375 g 12.5 mL/hr over 240 Minutes Intravenous Every 8 hours 01/03/21 2154 01/04/21 0853   01/03/21 2100  vancomycin (VANCOREADY) IVPB 500 mg/100 mL        500 mg 100 mL/hr over 60 Minutes Intravenous  Once 01/03/21 1848 01/04/21 0019   01/03/21 1900  ceFEPIme (MAXIPIME) 2 g in sodium chloride 0.9 % 100 mL IVPB        2 g 200 mL/hr over 30 Minutes Intravenous  Once 01/03/21 1845 01/03/21 1925   01/03/21 1845  metroNIDAZOLE (FLAGYL) IVPB 500 mg  Status:  Discontinued        500 mg 100 mL/hr over 60 Minutes Intravenous  Every 8 hours 01/03/21 1841 01/04/21 0853   01/03/21 1845  vancomycin (VANCOREADY) IVPB 2000 mg/400 mL        2,000 mg 200 mL/hr over 120 Minutes Intravenous  Once 01/03/21 1845 01/03/21 2248       Assessment/Plan: s/p Procedure(s): LOWER EXTREMITY ANGIOGRAPHY (Left) Continue with current medical therapy, will follow on a as needed basis.  LOS: 4 days    [name] 01/07/2021

## 2021-01-08 DIAGNOSIS — L0390 Cellulitis, unspecified: Secondary | ICD-10-CM | POA: Diagnosis not present

## 2021-01-08 DIAGNOSIS — A419 Sepsis, unspecified organism: Secondary | ICD-10-CM | POA: Diagnosis not present

## 2021-01-08 LAB — CBC
HCT: 40.2 % (ref 39.0–52.0)
Hemoglobin: 13.5 g/dL (ref 13.0–17.0)
MCH: 31.2 pg (ref 26.0–34.0)
MCHC: 33.6 g/dL (ref 30.0–36.0)
MCV: 92.8 fL (ref 80.0–100.0)
Platelets: 153 10*3/uL (ref 150–400)
RBC: 4.33 MIL/uL (ref 4.22–5.81)
RDW: 14.1 % (ref 11.5–15.5)
WBC: 7.7 10*3/uL (ref 4.0–10.5)
nRBC: 0 % (ref 0.0–0.2)

## 2021-01-08 LAB — BASIC METABOLIC PANEL
Anion gap: 7 (ref 5–15)
BUN: 27 mg/dL — ABNORMAL HIGH (ref 8–23)
CO2: 26 mmol/L (ref 22–32)
Calcium: 7.6 mg/dL — ABNORMAL LOW (ref 8.9–10.3)
Chloride: 103 mmol/L (ref 98–111)
Creatinine, Ser: 0.95 mg/dL (ref 0.61–1.24)
GFR, Estimated: >60 mL/min (ref >60)
Glucose, Bld: 289 mg/dL — ABNORMAL HIGH (ref 70–99)
Potassium: 4 mmol/L (ref 3.5–5.1)
Sodium: 136 mmol/L (ref 135–145)

## 2021-01-08 LAB — GLUCOSE, CAPILLARY
Glucose-Capillary: 267 mg/dL — ABNORMAL HIGH (ref 70–99)
Glucose-Capillary: 296 mg/dL — ABNORMAL HIGH (ref 70–99)
Glucose-Capillary: 329 mg/dL — ABNORMAL HIGH (ref 70–99)
Glucose-Capillary: 377 mg/dL — ABNORMAL HIGH (ref 70–99)

## 2021-01-08 MED ORDER — DILTIAZEM HCL 30 MG PO TABS
90.0000 mg | ORAL_TABLET | Freq: Four times a day (QID) | ORAL | Status: DC
  Administered 2021-01-08 (×3): 90 mg via ORAL
  Filled 2021-01-08 (×4): qty 3

## 2021-01-08 MED ORDER — METOPROLOL TARTRATE 50 MG PO TABS
100.0000 mg | ORAL_TABLET | Freq: Two times a day (BID) | ORAL | Status: DC
  Administered 2021-01-08 (×2): 100 mg via ORAL
  Filled 2021-01-08 (×2): qty 2

## 2021-01-08 MED ORDER — INSULIN ASPART 100 UNIT/ML IJ SOLN
7.0000 [IU] | Freq: Three times a day (TID) | INTRAMUSCULAR | Status: DC
  Administered 2021-01-09 – 2021-01-11 (×6): 7 [IU] via SUBCUTANEOUS
  Filled 2021-01-08 (×5): qty 1

## 2021-01-08 MED ORDER — INSULIN GLARGINE-YFGN 100 UNIT/ML ~~LOC~~ SOLN
30.0000 [IU] | Freq: Two times a day (BID) | SUBCUTANEOUS | Status: DC
  Administered 2021-01-08 – 2021-01-10 (×4): 30 [IU] via SUBCUTANEOUS
  Filled 2021-01-08 (×5): qty 0.3

## 2021-01-08 NOTE — Progress Notes (Signed)
PROGRESS NOTE    [name].  GX:[id number] DOB: 1955/12/09 DOA: 01/03/2021 PCP: [name], MD   Chief Complain: Fever, chills  Brief Narrative: Patient is a 65 year old male with history of hypertension, congestive heart failure, hyperlipidemia, diabetes type 2, paroxysmal A. fib, chronic ulcer on the left foot presented to the emergency department with complaints of fever, chills.  On presentation he was febrile with temperature of 104, he was tachycardic.  He was found to have worsening ulcer of the left foot.  Started on Rocephin antibiotics, blood culture sent.  Podiatry also consulted.  X-ray of the left foot showed osteomyelitis but MRI was not suggestive of osteomyelitis but showed cellulitis/myositis.  Blood cultures showed Streptococcus, ID consulted, currently on Unasyn.  Hospital course also remarkable for A. fib with RVR, cardiology consulted.  Podiatry planning for fifth ray amputation but delayed due to uncontrolled heart rate.  Heart rate fluctuating from 100-140.  On Cardizem drip.  Cardiology following  Assessment & Plan:   Principal Problem:   Sepsis due to cellulitis [facility]) Active Problems:   Diabetes mellitus type 2 in obese (HCC)   CAD (coronary artery disease)   CAD (coronary artery disease), autologous vein bypass graft   Pure hypercholesterolemia   RLS (restless legs syndrome)   Chronic osteomyelitis of left foot (HCC)   Osteomyelitis (HCC)   Left foot cellulitis/left foot chronic ulcer/streptococcal bacteremia: Presented with worsening left foot ulcer.  MRI without signs of osteomyelitis but showed extensive cellulitis/myositis. Blood cultures showed group B streptococcus.  Currently on Unasyn.  ID following.  Repeat blood cultures have been negative. He follows with podiatry as an outpatient.  Being planned for ray amputation after stabilization in heart rate.  No need of TEE as per ID.  TTE did not show any vegetation. Vascular surgery was  also following during this hospitalization and he underwent angiogram  New onset A. fib with RVR: Cardiology following.  Started on amiodarone drip which has been changed to oral.  Still in A. fib with the RVR this morning.on Cardizem drip.  Also on digoxin, metoprolol.  Monitor on telemetry He is on Eliquis for anticoagulation .  Cardiology closely following  Diabetes type 2: Recent hemoglobin of 8.9.  Monitor blood sugars.  Continue current insulin regimen  History of coronary artery disease: No anginal symptoms.  On Plavix, isosorbide at home.  Also on a statin.  Hypertension: Currently stable.  Continue current medications  BPH: On Flomax  Morbid obesity: BMI 43.47         DVT prophylaxis:Eliquis Code Status: Full Family Communication: None at the bedside Status is: Inpatient  Remains inpatient appropriate because:Inpatient level of care appropriate due to severity of illness  Dispo: The patient is from: Home              Anticipated d/c is to: Home              Patient currently is not medically stable to d/c.   Difficult to place patient No      Consultants: Cardiology, ID, podiatry  Procedures: None yet  Antimicrobials:  Anti-infectives (From admission, onward)    Start     Dose/Rate Route Frequency Ordered Stop   01/06/21 1230  Ampicillin-Sulbactam (UNASYN) 3 g in sodium chloride 0.9 % 100 mL IVPB        3 g 200 mL/hr over 30 Minutes Intravenous Every 6 hours 01/06/21 1130     01/05/21 0600  ceFAZolin (ANCEF) IVPB 2g/100 mL  premix        2 g 200 mL/hr over 30 Minutes Intravenous On call to O.R. 01/04/21 1711 01/05/21 0529   01/04/21 1400  cefTRIAXone (ROCEPHIN) 2 g in sodium chloride 0.9 % 100 mL IVPB  Status:  Discontinued        2 g 200 mL/hr over 30 Minutes Intravenous Every 24 hours 01/04/21 0853 01/06/21 1130   01/04/21 1000  vancomycin (VANCOREADY) IVPB 1750 mg/350 mL  Status:  Discontinued        1,750 mg 175 mL/hr over 120 Minutes Intravenous  Every 12 hours 01/03/21 2154 01/04/21 0757   01/04/21 1000  vancomycin (VANCOREADY) IVPB 1500 mg/300 mL  Status:  Discontinued        1,500 mg 150 mL/hr over 120 Minutes Intravenous Every 12 hours 01/04/21 0757 01/04/21 0853   01/03/21 2200  piperacillin-tazobactam (ZOSYN) IVPB 3.375 g  Status:  Discontinued        3.375 g 12.5 mL/hr over 240 Minutes Intravenous Every 8 hours 01/03/21 2154 01/04/21 0853   01/03/21 2100  vancomycin (VANCOREADY) IVPB 500 mg/100 mL        500 mg 100 mL/hr over 60 Minutes Intravenous  Once 01/03/21 1848 01/04/21 0019   01/03/21 1900  ceFEPIme (MAXIPIME) 2 g in sodium chloride 0.9 % 100 mL IVPB        2 g 200 mL/hr over 30 Minutes Intravenous  Once 01/03/21 1845 01/03/21 1925   01/03/21 1845  metroNIDAZOLE (FLAGYL) IVPB 500 mg  Status:  Discontinued        500 mg 100 mL/hr over 60 Minutes Intravenous Every 8 hours 01/03/21 1841 01/04/21 0853   01/03/21 1845  vancomycin (VANCOREADY) IVPB 2000 mg/400 mL        2,000 mg 200 mL/hr over 120 Minutes Intravenous  Once 01/03/21 1845 01/03/21 2248       Subjective:  Patient seen and examined at the bedside this morning.  He was seen in A. fib with RVR but asymptomatic.  Lying on bed.  Denies any chest pain or shortness of breath.   . Objective: Vitals:   01/08/21 0451 01/08/21 0541 01/08/21 0557 01/08/21 0742  BP: 123/85   130/76  Pulse: (!) 106  (!) 118 86  Resp: 20   18  Temp: 97.8 F (36.6 C)   98.2 F (36.8 C)  TempSrc:      SpO2: 91%   100%  Weight:  (!) 162.5 kg    Height:        Intake/Output Summary (Last 24 hours) at 01/08/2021 0759 Last data filed at 01/07/2021 1820 Gross per 24 hour  Intake 240 ml  Output 0 ml  Net 240 ml   Filed Weights   01/06/21 0600 01/07/21 0435 01/08/21 0541  Weight: (!) 159.9 kg (!) 162 kg (!) 162.5 kg    Examination:  General exam: Overall comfortable, not in distress, morbidly obese HEENT: PERRL Respiratory system:  no wheezes or crackles   Cardiovascular system: Irregularly irregular rhythm Gastrointestinal system: Abdomen is nondistended, soft and nontender. Central nervous system: Alert and oriented Extremities: Left foot wrapped with dressing Skin: Chronic venous stasis changes on bilateral lower extremities    Data Reviewed: I have personally reviewed following labs and imaging studies  CBC: Recent Labs  Lab 01/03/21 1726 01/04/21 0638 01/05/21 0726 01/08/21 0533  WBC 11.4* 12.0* 7.3 7.7  NEUTROABS 10.5* 11.2* 5.6  --   HGB 14.6 13.4 14.2 13.5  HCT 43.7 40.7 43.8 40.2  MCV 91.2 91.1 93.4 92.8  PLT 150 148* 143* 0000000   Basic Metabolic Panel: Recent Labs  Lab 01/03/21 1726 01/04/21 0638 01/05/21 0726 01/08/21 0533  NA 135 134* 135 136  K 4.4 3.8 4.1 4.0  CL 99 100 100 103  CO2 '27 26 27 26  '$ GLUCOSE 269* 317* 325* 289*  BUN '21 21 19 '$ 27*  CREATININE 0.85 0.98 0.97 0.95  CALCIUM 8.8* 8.5* 8.1* 7.6*  MG  --   --  2.0  --    GFR: Estimated Creatinine Clearance: 128.4 mL/min (by C-G formula based on SCr of 0.95 mg/dL). Liver Function Tests: Recent Labs  Lab 01/03/21 1726 01/04/21 0638 01/05/21 0726  AST 18 12* 13*  ALT '16 11 13  '$ ALKPHOS 103 87 78  BILITOT 0.6 0.7 0.5  PROT 7.3 6.5 6.4*  ALBUMIN 3.4* 2.9* 2.8*   No results for input(s): LIPASE, AMYLASE in the last 168 hours. No results for input(s): AMMONIA in the last 168 hours. Coagulation Profile: Recent Labs  Lab 01/03/21 1726 01/05/21 0726  INR 1.0 1.2   Cardiac Enzymes: No results for input(s): CKTOTAL, CKMB, CKMBINDEX, TROPONINI in the last 168 hours. BNP (last 3 results) No results for input(s): PROBNP in the last 8760 hours. HbA1C: No results for input(s): HGBA1C in the last 72 hours. CBG: Recent Labs  Lab 01/07/21 0820 01/07/21 1143 01/07/21 1631 01/07/21 2152 01/08/21 0741  GLUCAP 258* 301* 315* 286* 267*   Lipid Profile: No results for input(s): CHOL, HDL, LDLCALC, TRIG, CHOLHDL, LDLDIRECT in the last 72  hours. Thyroid Function Tests: No results for input(s): TSH, T4TOTAL, FREET4, T3FREE, THYROIDAB in the last 72 hours. Anemia Panel: No results for input(s): VITAMINB12, FOLATE, FERRITIN, TIBC, IRON, RETICCTPCT in the last 72 hours. Sepsis Labs: Recent Labs  Lab 01/03/21 1718 01/03/21 1927  LATICACIDVEN 1.7 1.1    Recent Results (from the past 240 hour(s))  Blood culture (routine single)     Status: Abnormal (Preliminary result)   Collection Time: 01/03/21  5:37 PM   Specimen: BLOOD  Result Value Ref Range Status   Specimen Description   Final    BLOOD BLOOD RIGHT FOREARM Performed at [facility], [address] [place], [place] [address]    Special Requests   Final    BOTTLES DRAWN AEROBIC AND ANAEROBIC Blood Culture adequate volume Performed at [facility], [address] [place], [place] [address]    Culture  Setup Time   Final    GRAM POSITIVE COCCI AEROBIC BOTTLE ONLY CRITICAL RESULT CALLED TO, READ BACK BY AND VERIFIED WITH: Coralie Carpen 01/04/21 [place] Performed at [facility], [place] [address] [place], [place] [address]    Culture STREPTOCOCCUS GROUP G (A)  Final   Report Status PENDING  Incomplete   Organism ID, Bacteria STREPTOCOCCUS GROUP G  Final      Susceptibility   Streptococcus group g - MIC*    CLINDAMYCIN <=0.25 SENSITIVE Sensitive     AMPICILLIN <=0.25 SENSITIVE Sensitive     ERYTHROMYCIN <=0.12 SENSITIVE Sensitive     VANCOMYCIN 0.5 SENSITIVE Sensitive     CEFTRIAXONE <=0.12 SENSITIVE Sensitive     LEVOFLOXACIN 0.5 SENSITIVE Sensitive     PENICILLIN Value in next row Sensitive      SENSITIVEMIC =0.06S    * STREPTOCOCCUS GROUP G  Blood Culture ID Panel (Reflexed)     Status: Abnormal   Collection Time: 01/03/21  5:37 PM  Result Value Ref Range Status  Enterococcus faecalis NOT DETECTED NOT DETECTED Final   Enterococcus Faecium NOT DETECTED NOT DETECTED Final   Listeria monocytogenes NOT DETECTED NOT DETECTED Final    Staphylococcus species NOT DETECTED NOT DETECTED Final   Staphylococcus aureus (BCID) NOT DETECTED NOT DETECTED Final   Staphylococcus epidermidis NOT DETECTED NOT DETECTED Final   Staphylococcus lugdunensis NOT DETECTED NOT DETECTED Final   Streptococcus species DETECTED (A) NOT DETECTED Final    Comment: Not Enterococcus species, Streptococcus agalactiae, Streptococcus pyogenes, or Streptococcus pneumoniae. CRITICAL RESULT CALLED TO, READ BACK BY AND VERIFIED WITH: SAMANTHA RAUER 01/04/21 0749 KLW    Streptococcus agalactiae NOT DETECTED NOT DETECTED Final   Streptococcus pneumoniae NOT DETECTED NOT DETECTED Final   Streptococcus pyogenes NOT DETECTED NOT DETECTED Final   A.calcoaceticus-baumannii NOT DETECTED NOT DETECTED Final   Bacteroides fragilis NOT DETECTED NOT DETECTED Final   Enterobacterales NOT DETECTED NOT DETECTED Final   Enterobacter cloacae complex NOT DETECTED NOT DETECTED Final   Escherichia coli NOT DETECTED NOT DETECTED Final   Klebsiella aerogenes NOT DETECTED NOT DETECTED Final   Klebsiella oxytoca NOT DETECTED NOT DETECTED Final   Klebsiella pneumoniae NOT DETECTED NOT DETECTED Final   Proteus species NOT DETECTED NOT DETECTED Final   Salmonella species NOT DETECTED NOT DETECTED Final   Serratia marcescens NOT DETECTED NOT DETECTED Final   Haemophilus influenzae NOT DETECTED NOT DETECTED Final   Neisseria meningitidis NOT DETECTED NOT DETECTED Final   Pseudomonas aeruginosa NOT DETECTED NOT DETECTED Final   Stenotrophomonas maltophilia NOT DETECTED NOT DETECTED Final   Candida albicans NOT DETECTED NOT DETECTED Final   Candida auris NOT DETECTED NOT DETECTED Final   Candida glabrata NOT DETECTED NOT DETECTED Final   Candida krusei NOT DETECTED NOT DETECTED Final   Candida parapsilosis NOT DETECTED NOT DETECTED Final   Candida tropicalis NOT DETECTED NOT DETECTED Final   Cryptococcus neoformans/gattii NOT DETECTED NOT DETECTED Final    Comment: Performed at  [facility], [address] [place], [place] [address]  Resp Panel by RT-PCR (Flu A&B, Covid) Nasopharyngeal Swab     Status: None   Collection Time: 01/03/21  6:24 PM   Specimen: Nasopharyngeal Swab; Nasopharyngeal(NP) swabs in vial transport medium  Result Value Ref Range Status   SARS Coronavirus 2 by RT PCR NEGATIVE NEGATIVE Final    Comment: (NOTE) SARS-CoV-2 target nucleic acids are NOT DETECTED.  The SARS-CoV-2 RNA is generally detectable in upper respiratory specimens during the acute phase of infection. The lowest concentration of SARS-CoV-2 viral copies this assay can detect is 138 copies/mL. A negative result does not preclude SARS-Cov-2 infection and should not be used as the sole basis for treatment or other patient management decisions. A negative result may occur with  improper specimen collection/handling, submission of specimen other than nasopharyngeal swab, presence of viral mutation(s) within the areas targeted by this assay, and inadequate number of viral copies(<138 copies/mL). A negative result must be combined with clinical observations, patient history, and epidemiological information. The expected result is Negative.  Fact Sheet for Patients:  EntrepreneurPulse.com.au  Fact Sheet for Healthcare Providers:  IncredibleEmployment.be  This test is no t yet approved or cleared by the [place] FDA and  has been authorized for detection and/or diagnosis of SARS-CoV-2 by FDA under an Emergency Use Authorization (EUA). This EUA will remain  in effect (meaning this test can be used) for the duration of the COVID-19 declaration under Section 564(b)(1) of the Act, 21 U.S.C.section 360bbb-3(b)(1), unless the authorization is terminated  or revoked sooner.       Influenza A by PCR NEGATIVE NEGATIVE Final   Influenza B by PCR NEGATIVE NEGATIVE Final    Comment: (NOTE) The Xpert Xpress SARS-CoV-2/FLU/RSV plus assay  is intended as an aid in the diagnosis of influenza from Nasopharyngeal swab specimens and should not be used as a sole basis for treatment. Nasal washings and aspirates are unacceptable for Xpert Xpress SARS-CoV-2/FLU/RSV testing.  Fact Sheet for Patients: EntrepreneurPulse.com.au  Fact Sheet for Healthcare Providers: IncredibleEmployment.be  This test is not yet approved or cleared by the [place] FDA and has been authorized for detection and/or diagnosis of SARS-CoV-2 by FDA under an Emergency Use Authorization (EUA). This EUA will remain in effect (meaning this test can be used) for the duration of the COVID-19 declaration under Section 564(b)(1) of the Act, 21 U.S.C. section 360bbb-3(b)(1), unless the authorization is terminated or revoked.  Performed at [facility], [address] [place], [place] [address]   Blood culture (single)     Status: Abnormal   Collection Time: 01/03/21  7:15 PM   Specimen: BLOOD  Result Value Ref Range Status   Specimen Description   Final    BLOOD BLOOD LEFT HAND Performed at [facility], [address] [place], [place] [address]    Special Requests   Final    BOTTLES DRAWN AEROBIC AND ANAEROBIC Blood Culture results may not be optimal due to an excessive volume of blood received in culture bottles Performed at [facility], [address] [place], [place] [address]    Culture  Setup Time   Final    GRAM POSITIVE COCCI ANAEROBIC BOTTLE ONLY CRITICAL RESULT CALLED TO, READ BACK BY AND VERIFIED WITH: Coralie Carpen 01/04/21 [place] Performed at [facility], [address] [place], [place] [address]    Culture (A)  Final    STREPTOCOCCUS GROUP G SUSCEPTIBILITIES PERFORMED ON PREVIOUS CULTURE WITHIN THE LAST 5 DAYS. Performed at [facility], [place] [address], [place], [place] [address]    Report Status 01/06/2021 FINAL  Final  MRSA Next Gen by PCR,  Nasal     Status: None   Collection Time: 01/04/21  7:52 AM   Specimen: Nasal Mucosa; Nasal Swab  Result Value Ref Range Status   MRSA by PCR Next Gen NOT DETECTED NOT DETECTED Final    Comment: (NOTE) The GeneXpert MRSA Assay (FDA approved for NASAL specimens only), is one component of a comprehensive MRSA colonization surveillance program. It is not intended to diagnose MRSA infection nor to guide or monitor treatment for MRSA infections. Test performance is not FDA approved in patients less than 78 years old. Performed at [facility], [address] [place], [place] [address]   Aerobic/Anaerobic Culture w Gram Stain (surgical/deep wound)     Status: None (Preliminary result)   Collection Time: 01/04/21 12:39 PM   Specimen: Foot; Wound  Result Value Ref Range Status   Specimen Description   Final    FOOT LEFT Performed at [facility], [address] [place], [place] [address]    Special Requests   Final    NONE Performed at [facility], [address] [place], [place] [address]    Gram Stain   Final    FEW SQUAMOUS EPITHELIAL CELLS PRESENT FEW WBC SEEN MODERATE GRAM POSITIVE COCCI FEW GRAM POSITIVE RODS Performed at [facility], [place] [address] [place], [place] [address]    Culture  Final    CULTURE REINCUBATED FOR BETTER GROWTH NO ANAEROBES ISOLATED; CULTURE IN PROGRESS FOR 5 DAYS    Report Status PENDING  Incomplete  CULTURE, BLOOD (ROUTINE X 2) w Reflex to ID Panel     Status: None (Preliminary result)   Collection Time: 01/06/21 12:23 AM   Specimen: BLOOD  Result Value Ref Range Status   Specimen Description BLOOD RIGHT ASSIST CONTROL  Final   Special Requests   Final    BOTTLES DRAWN AEROBIC AND ANAEROBIC Blood Culture adequate volume   Culture   Final    NO GROWTH 2 DAYS Performed at [facility], [address], [place], [place] [address]    Report Status PENDING  Incomplete  CULTURE, BLOOD (ROUTINE X 2)  w Reflex to ID Panel     Status: None (Preliminary result)   Collection Time: 01/06/21 12:25 AM   Specimen: BLOOD  Result Value Ref Range Status   Specimen Description BLOOD LEFT ASSIST CONTROL  Final   Special Requests   Final    BOTTLES DRAWN AEROBIC ONLY Blood Culture adequate volume   Culture   Final    NO GROWTH 2 DAYS Performed at [facility], [address] [place], [place] [address]    Report Status PENDING  Incomplete         Radiology Studies: No results found.      Scheduled Meds:  amiodarone  400 mg Oral Daily   apixaban  5 mg Oral BID   atorvastatin  20 mg Oral QHS   chlorhexidine  60 mL Topical Once   clopidogrel  75 mg Oral Daily   digoxin  0.125 mg Intravenous Daily   ezetimibe  10 mg Oral QHS   insulin aspart  0-15 Units Subcutaneous TID WC   insulin aspart  0-5 Units Subcutaneous QHS   insulin aspart  4 Units Subcutaneous TID WC   insulin glargine-yfgn  20 Units Subcutaneous BID   metoprolol tartrate  50 mg Oral Q8H   povidone-iodine  2 application Topical Once   pramipexole  2 mg Oral QHS   primidone  250 mg Oral BID   tamsulosin  0.4 mg Oral Daily   Continuous Infusions:  sodium chloride     ampicillin-sulbactam (UNASYN) IV 3 g (01/08/21 0602)   diltiazem (CARDIZEM) infusion 15 mg/hr (01/08/21 0613)     LOS: 5 days    Time spent:25 mins. More than 50% of that time was spent in counseling and/or coordination of care.      Shelly Coss, MD [facility] P9/02/2021, 7:59 AM

## 2021-01-08 NOTE — Progress Notes (Signed)
[facility] Cardiology    SUBJECTIVE: Patient feels reasonably well asymptomatic denies any palpitations or tachycardia awaiting surgical procedure on his foot denies any fever chills or sweats   Vitals:   01/08/21 0451 01/08/21 0541 01/08/21 0557 01/08/21 0742  BP: 123/85   130/76  Pulse: (!) 106  (!) 118 86  Resp: 20   18  Temp: 97.8 F (36.6 C)   98.2 F (36.8 C)  TempSrc:    Oral  SpO2: 91%   100%  Weight:  (!) 162.5 kg    Height:         Intake/Output Summary (Last 24 hours) at 01/08/2021 1204 Last data filed at 01/08/2021 0940 Gross per 24 hour  Intake 1688.04 ml  Output 0 ml  Net 1688.04 ml      PHYSICAL EXAM  General: Well developed, well nourished, in no acute distress HEENT:  Normocephalic and atramatic Neck:  No JVD.  Lungs: Clear bilaterally to auscultation and percussion. Heart: Irregular irregular. Normal S1 and S2 without gallops or murmurs.  Abdomen: Bowel sounds are positive, abdomen soft and non-tender  Msk:  Back normal, normal gait. Normal strength and tone for age. Extremities: No clubbing, cyanosis or edema.  Left foot bandaged Neuro: Alert and oriented X 3. Psych:  Good affect, responds appropriately   LABS: Basic Metabolic Panel: Recent Labs    01/08/21 0533  NA 136  K 4.0  CL 103  CO2 26  GLUCOSE 289*  BUN 27*  CREATININE 0.95  CALCIUM 7.6*   Liver Function Tests: No results for input(s): AST, ALT, ALKPHOS, BILITOT, PROT, ALBUMIN in the last 72 hours. No results for input(s): LIPASE, AMYLASE in the last 72 hours. CBC: Recent Labs    01/08/21 0533  WBC 7.7  HGB 13.5  HCT 40.2  MCV 92.8  PLT 153   Cardiac Enzymes: No results for input(s): CKTOTAL, CKMB, CKMBINDEX, TROPONINI in the last 72 hours. BNP: Invalid input(s): POCBNP D-Dimer: No results for input(s): DDIMER in the last 72 hours. Hemoglobin A1C: No results for input(s): HGBA1C in the last 72 hours. Fasting Lipid Panel: No results for input(s): CHOL, HDL, LDLCALC,  TRIG, CHOLHDL, LDLDIRECT in the last 72 hours. Thyroid Function Tests: No results for input(s): TSH, T4TOTAL, T3FREE, THYROIDAB in the last 72 hours.  Invalid input(s): FREET3 Anemia Panel: No results for input(s): VITAMINB12, FOLATE, FERRITIN, TIBC, IRON, RETICCTPCT in the last 72 hours.  No results found.   Echo preserved left ventricular function  TELEMETRY: Atrial fibrillation rapid ventricular response:  ASSESSMENT AND PLAN:  Principal Problem:   Sepsis due to cellulitis [facility]) Active Problems:   Diabetes mellitus type 2 in obese (HCC)   CAD (coronary artery disease)   CAD (coronary artery disease), autologous vein bypass graft   Pure hypercholesterolemia   RLS (restless legs syndrome)   Chronic osteomyelitis of left foot (HCC)   Osteomyelitis (HCC)    Plan plan Atrial fibrillation rapid ventricular response Preop for foot surgery for posterior diabetic foot ulcer Continue diabetes management and control Continue medical therapy for coronary artery disease Continue statin therapy for hyperlipidemia Continue broad-spectrum antibiotic therapy Recommend weight loss exercise portion control   Yolonda Kida, MD 01/08/2021 12:04 PM

## 2021-01-08 NOTE — Progress Notes (Signed)
PHARMACY - PHYSICIAN COMMUNICATION CRITICAL VALUE ALERT - BLOOD CULTURE IDENTIFICATION (BCID)  Romero A Vihas Mcphearson. is an 65 y.o. male who presented to [facility] on 01/03/2021 with a chief complaint of group G bacteremia 2/2 LLE wound infection.  Assessment:  BCID. New report of GPR's in 1 of 4 (anaero) vials; in addition to known prior 2 of 4 group-G strep. GPR's could represent corynebacterium, listeria, clostridium, etc. Current coverage w/ Unasyn 3g q6h is appropriate until further speciation, no recommendation for regimen change at present   Name of physician (or Provider) Contacted: Dr. Tawanna Solo  Current antibiotics: Unasyn 3g q6H  Changes to prescribed antibiotics recommended:  Patient is on recommended antibiotics - No changes needed; CTM for speciation of GPR's.  Results for orders placed or performed during the hospital encounter of 01/03/21  Blood Culture ID Panel (Reflexed) (Collected: 01/03/2021  5:37 PM)  Result Value Ref Range   Enterococcus faecalis NOT DETECTED NOT DETECTED   Enterococcus Faecium NOT DETECTED NOT DETECTED   Listeria monocytogenes NOT DETECTED NOT DETECTED   Staphylococcus species NOT DETECTED NOT DETECTED   Staphylococcus aureus (BCID) NOT DETECTED NOT DETECTED   Staphylococcus epidermidis NOT DETECTED NOT DETECTED   Staphylococcus lugdunensis NOT DETECTED NOT DETECTED   Streptococcus species DETECTED (A) NOT DETECTED   Streptococcus agalactiae NOT DETECTED NOT DETECTED   Streptococcus pneumoniae NOT DETECTED NOT DETECTED   Streptococcus pyogenes NOT DETECTED NOT DETECTED   A.calcoaceticus-baumannii NOT DETECTED NOT DETECTED   Bacteroides fragilis NOT DETECTED NOT DETECTED   Enterobacterales NOT DETECTED NOT DETECTED   Enterobacter cloacae complex NOT DETECTED NOT DETECTED   Escherichia coli NOT DETECTED NOT DETECTED   Klebsiella aerogenes NOT DETECTED NOT DETECTED   Klebsiella oxytoca NOT DETECTED NOT DETECTED   Klebsiella pneumoniae NOT DETECTED  NOT DETECTED   Proteus species NOT DETECTED NOT DETECTED   Salmonella species NOT DETECTED NOT DETECTED   Serratia marcescens NOT DETECTED NOT DETECTED   Haemophilus influenzae NOT DETECTED NOT DETECTED   Neisseria meningitidis NOT DETECTED NOT DETECTED   Pseudomonas aeruginosa NOT DETECTED NOT DETECTED   Stenotrophomonas maltophilia NOT DETECTED NOT DETECTED   Candida albicans NOT DETECTED NOT DETECTED   Candida auris NOT DETECTED NOT DETECTED   Candida glabrata NOT DETECTED NOT DETECTED   Candida krusei NOT DETECTED NOT DETECTED   Candida parapsilosis NOT DETECTED NOT DETECTED   Candida tropicalis NOT DETECTED NOT DETECTED   Cryptococcus neoformans/gattii NOT DETECTED NOT DETECTED    Shanon Brow Isabele Lollar 01/08/2021  11:19 AM

## 2021-01-09 DIAGNOSIS — L89892 Pressure ulcer of other site, stage 2: Secondary | ICD-10-CM | POA: Diagnosis not present

## 2021-01-09 DIAGNOSIS — I70249 Atherosclerosis of native arteries of left leg with ulceration of unspecified site: Secondary | ICD-10-CM | POA: Diagnosis not present

## 2021-01-09 DIAGNOSIS — A419 Sepsis, unspecified organism: Secondary | ICD-10-CM | POA: Diagnosis not present

## 2021-01-09 DIAGNOSIS — L0390 Cellulitis, unspecified: Secondary | ICD-10-CM | POA: Diagnosis not present

## 2021-01-09 DIAGNOSIS — E1151 Type 2 diabetes mellitus with diabetic peripheral angiopathy without gangrene: Secondary | ICD-10-CM | POA: Diagnosis not present

## 2021-01-09 DIAGNOSIS — Z4781 Encounter for orthopedic aftercare following surgical amputation: Secondary | ICD-10-CM | POA: Diagnosis not present

## 2021-01-09 LAB — GLUCOSE, CAPILLARY
Glucose-Capillary: 260 mg/dL — ABNORMAL HIGH (ref 70–99)
Glucose-Capillary: 209 mg/dL — ABNORMAL HIGH (ref 70–99)
Glucose-Capillary: 215 mg/dL — ABNORMAL HIGH (ref 70–99)
Glucose-Capillary: 146 mg/dL — ABNORMAL HIGH (ref 70–99)
Glucose-Capillary: 295 mg/dL — ABNORMAL HIGH (ref 70–99)

## 2021-01-09 MED ORDER — METOPROLOL TARTRATE 50 MG PO TABS
50.0000 mg | ORAL_TABLET | Freq: Two times a day (BID) | ORAL | Status: DC
  Administered 2021-01-09 – 2021-01-15 (×14): 50 mg via ORAL
  Filled 2021-01-09 (×14): qty 1

## 2021-01-09 MED ORDER — DILTIAZEM HCL-DEXTROSE 125-5 MG/125ML-% IV SOLN (PREMIX)
5.0000 mg/h | INTRAVENOUS | Status: DC
  Administered 2021-01-09: 10 mg/h via INTRAVENOUS
  Administered 2021-01-09: 12.5 mg/h via INTRAVENOUS
  Administered 2021-01-10 – 2021-01-12 (×7): 15 mg/h via INTRAVENOUS
  Filled 2021-01-09 (×10): qty 125

## 2021-01-09 MED ORDER — DILTIAZEM LOAD VIA INFUSION
20.0000 mg | Freq: Once | INTRAVENOUS | Status: AC
  Administered 2021-01-09: 20 mg via INTRAVENOUS
  Filled 2021-01-09: qty 20

## 2021-01-09 MED ORDER — SODIUM CHLORIDE 0.9 % IV BOLUS
500.0000 mL | Freq: Once | INTRAVENOUS | Status: AC
  Administered 2021-01-09: 500 mL via INTRAVENOUS

## 2021-01-09 NOTE — Progress Notes (Signed)
[place] [facility] [place] NAME: [name]    MR#:  [id number]  DATE OF BIRTH:  1955/06/16  SUBJECTIVE:  patient currently on IV diltiazem drip for intermittent tachycardia. His scheduled for podiatry for fifth ray debridement. Wife at bedside  REVIEW OF SYSTEMS:   Review of Systems  Constitutional:  Negative for chills, fever and weight loss.  HENT:  Negative for ear discharge, ear pain and nosebleeds.   Eyes:  Negative for blurred vision, pain and discharge.  Respiratory:  Negative for sputum production, shortness of breath, wheezing and stridor.   Cardiovascular:  Negative for chest pain, palpitations, orthopnea and PND.  Gastrointestinal:  Negative for abdominal pain, diarrhea, nausea and vomiting.  Genitourinary:  Negative for frequency and urgency.  Musculoskeletal:  Positive for joint pain. Negative for back pain.  Neurological:  Negative for sensory change, speech change, focal weakness and weakness.  Psychiatric/Behavioral:  Negative for depression and hallucinations. The patient is not nervous/anxious.   Tolerating Diet:npo Tolerating PT:   DRUG ALLERGIES:   Allergies  Allergen Reactions  . Statins     Other reaction(s): Muscle Pain Causes legs to ache per pt    VITALS:  Blood pressure (!) 114/97, pulse 87, temperature 98.7 F (37.1 C), resp. rate 19, height '6\' 4"'$  (1.93 m), weight (!) 162.8 kg, SpO2 96 %.  PHYSICAL EXAMINATION:   Physical Exam  GENERAL:  65 y.o.-year-old patient lying in the bed with no acute distress. obese LUNGS: Normal breath sounds bilaterally, no wheezing, rales, rhonchi. No use of accessory muscles of respiration.  CARDIOVASCULAR: S1, S2 normal. No murmurs, rubs, or gallops. Irregularly irregular ABDOMEN: Soft, nontender, nondistended. Bowel sounds present. No organomegaly or mass.  EXTREMITIES:   bilateral chronic venous stasis changes. Left foot wrapped with  dressing NEUROLOGIC: non  focal PSYCHIATRIC:  patient is alert and oriented x 3.  SKIN: as above  LABORATORY PANEL:  CBC Recent Labs  Lab 01/08/21 0533  WBC 7.7  HGB 13.5  HCT 40.2  PLT 153    Chemistries  Recent Labs  Lab 01/05/21 0726 01/08/21 0533  NA 135 136  K 4.1 4.0  CL 100 103  CO2 27 26  GLUCOSE 325* 289*  BUN 19 27*  CREATININE 0.97 0.95  CALCIUM 8.1* 7.6*  MG 2.0  --   AST 13*  --   ALT 13  --   ALKPHOS 78  --   BILITOT 0.5  --    Cardiac Enzymes No results for input(s): TROPONINI in the last 168 hours. RADIOLOGY:  No results found. ASSESSMENT AND PLAN:  65 year old male with history of hypertension, congestive heart failure, hyperlipidemia, diabetes type 2, paroxysmal A. fib, chronic ulcer on the left foot presented to the emergency department with complaints of fever, chills.  On presentation he was febrile with temperature of 104, he was tachycardic.  He was found to have worsening ulcer of the left foot  Left foot cellulitis/left foot chronic ulcer/streptococcal bacteremia:  --Presented with worsening left foot ulcer.   --MRI without signs of osteomyelitis but showed extensive cellulitis/myositis. --Blood cultures showed group B streptococcus.   --Currently on Unasyn.  ID following.  Repeat blood cultures have been negative. --Podiatry Dr Luana Shu plans for ray amputation after stabilization in heart rate.  No need of TEE as per ID.  TTE did not show any vegetation. --Vascular surgery was also following during this hospitalization and he underwent angiogram   New onset A. fib with  RVR: [facility] following.  -- Started on amiodarone drip which has been changed to oral.  Still in A. fib with the RVR this morning--on Cardizem drip.   --cont digoxin, metoprolol -- on Eliquis for anticoagulation --held for foot procedure   Diabetes type 2: Recent hemoglobin A1c  of 8.9.   --Continue current insulin regimen   History of coronary artery disease: No anginal symptoms.  On  Plavix, isosorbide,statin.   Hypertension: Currently stable.  Continue current medications   BPH: On Flomax   Morbid obesity: BMI 43.47         DVT prophylaxis:Eliquis Code Status: Full Family Communication:wife at the bedside Status is: Inpatient   Remains inpatient appropriate because:Inpatient level of care appropriate due to severity of illness   Dispo: The patient is from: Home              Anticipated d/c is to: Home              Patient currently is not medically stable to d/c.              Difficult to place patient No      TOTAL TIME TAKING CARE OF THIS PATIENT: 35 minutes.  >50% time spent on counselling and coordination of care  Note: This dictation was prepared with Dragon dictation along with smaller phrase technology. Any transcriptional errors that result from this process are unintentional.  Fritzi Mandes M.D    [facility]   CC: Primary care physician; [name], MD Patient ID: [name], male   DOB: 21-Sep-1955, 65 y.o.   MRN: [id number]

## 2021-01-09 NOTE — Progress Notes (Signed)
PODIATRY / FOOT AND ANKLE SURGERY PROGRESS NOTE  Requesting Physician: Dr. Reesa Chew  Reason for consult: Foot infection, wounds, sepsis   HPI: [name]. is a 65 y.o. male who presents today resting in bed comfortably.  Pt was transferred back to room due to emergency OR cases.  Pt has been NPO since midnight.  PMHx:  Past Medical History:  Diagnosis Date   Asthma    Coronary artery disease    Depression    Diabetes mellitus without complication ([place])    Gout    History anabolic steroid use    Hyperlipidemia    Hypertension    Hypogonadism in male    Morbid obesity ([place])    Myocardial infarction ([place])    Peripheral vascular disease ([place])    Perirectal abscess    Pleurisy    Sleep apnea    CPAP at night, no oxygen   Varicella     Surgical Hx:  Past Surgical History:  Procedure Laterality Date   ABDOMINAL AORTIC ANEURYSM REPAIR     AMPUTATION TOE Right 02/10/2016   Procedure: AMPUTATION TOE 3RD TOE;  Surgeon: Samara Deist, DPM;  Location: [facility] ORS;  Service: Podiatry;  Laterality: Right;   AMPUTATION TOE Left 02/24/2020   Procedure: AMPUTATION TOE;  Surgeon: Caroline More, DPM;  Location: [facility] ORS;  Service: Podiatry;  Laterality: Left;   APPLICATION OF WOUND VAC Left 02/29/2020   Procedure: APPLICATION OF WOUND VAC;  Surgeon: Caroline More, DPM;  Location: [facility] ORS;  Service: Podiatry;  Laterality: Left;   COLONOSCOPY WITH PROPOFOL [name] 11/18/2015   Procedure: COLONOSCOPY WITH PROPOFOL;  Surgeon: Manya Silvas, MD;  Location: [facility] ENDOSCOPY;  Service: Endoscopy;  Laterality: [name];   CORONARY ARTERY BYPASS GRAFT     CORONARY STENT INTERVENTION [name] 02/02/2020   Procedure: CORONARY STENT INTERVENTION;  Surgeon: Isaias Cowman, MD;  Location: [place] [facility];  Service: Cardiovascular;  Laterality: [name];   IRRIGATION AND DEBRIDEMENT FOOT Left 02/29/2020   Procedure: IRRIGATION AND DEBRIDEMENT FOOT;  Surgeon: Caroline More, DPM;  Location: [facility] ORS;  Service:  Podiatry;  Laterality: Left;   IRRIGATION AND DEBRIDEMENT FOOT Left 02/24/2020   Procedure: IRRIGATION AND DEBRIDEMENT FOOT;  Surgeon: Caroline More, DPM;  Location: [facility] ORS;  Service: Podiatry;  Laterality: Left;   KNEE ARTHROSCOPY     LEFT HEART CATH AND CORS/GRAFTS ANGIOGRAPHY [name] 02/02/2020   Procedure: LEFT HEART CATH AND CORS/GRAFTS ANGIOGRAPHY;  Surgeon: Teodoro Spray, MD;  Location: [place] [facility];  Service: Cardiovascular;  Laterality: [name];   LOWER EXTREMITY ANGIOGRAPHY Left 02/25/2020   Procedure: Lower Extremity Angiography;  Surgeon: Algernon Huxley, MD;  Location: [place] [facility];  Service: Cardiovascular;  Laterality: Left;   LOWER EXTREMITY ANGIOGRAPHY Left 01/04/2021   Procedure: LOWER EXTREMITY ANGIOGRAPHY;  Surgeon: Algernon Huxley, MD;  Location: [place] [facility];  Service: Cardiovascular;  Laterality: Left;   PERIPHERAL VASCULAR CATHETERIZATION Right 01/24/2016   Procedure: Lower Extremity Angiography;  Surgeon: Katha Cabal, MD;  Location: [place] [facility];  Service: Cardiovascular;  Laterality: Right;   PERIPHERAL VASCULAR CATHETERIZATION Right 01/25/2016   Procedure: Lower Extremity Angiography;  Surgeon: Katha Cabal, MD;  Location: [place] [facility];  Service: Cardiovascular;  Laterality: Right;   TOE AMPUTATION     TONSILLECTOMY      FHx: History reviewed. No pertinent family history.  Social History:  reports that he quit smoking about 5 years ago. His smoking use included cigarettes. He has  a 22.50 pack-year smoking history. He has never used smokeless tobacco. He reports that he does not currently use alcohol after a past usage of about 3.0 standard drinks per week. He reports that he does not use drugs.  Allergies:  Allergies  Allergen Reactions   Statins     Other reaction(s): Muscle Pain Causes legs to ache per pt    Medications Prior to Admission  Medication Sig Dispense Refill   insulin lispro (HUMALOG) 100 UNIT/ML injection  Inject 15-28 Units into the skin 3 (three) times daily with meals.     apixaban (ELIQUIS) 5 MG TABS tablet Take 1 tablet (5 mg total) by mouth 2 (two) times daily. 60 tablet 5   aspirin EC 81 MG tablet Take 1 tablet (81 mg total) by mouth daily. Swallow whole. 150 tablet 2   atorvastatin (LIPITOR) 40 MG tablet Take 1 tablet (40 mg total) by mouth daily. 30 tablet 0   clopidogrel (PLAVIX) 75 MG tablet Take 1 tablet (75 mg total) by mouth daily with breakfast. 30 tablet 0   cyclobenzaprine (FLEXERIL) 5 MG tablet Take 1 tablet (5 mg total) by mouth 3 (three) times daily as needed for muscle spasms. (Patient not taking: No sig reported) 12 tablet 0   ezetimibe (ZETIA) 10 MG tablet Take 10 mg by mouth daily.     ferrous sulfate 325 (65 FE) MG tablet Take 325 mg by mouth daily with breakfast.     furosemide (LASIX) 20 MG tablet Take 20 mg by mouth daily as needed for fluid or edema.     gabapentin (NEURONTIN) 300 MG capsule Take 300 mg by mouth daily.     insulin aspart (NOVOLOG) 100 UNIT/ML injection Inject 10 Units into the skin 3 (three) times daily with meals. (Patient not taking: No sig reported) 10 mL 11   isosorbide mononitrate (IMDUR) 60 MG 24 hr tablet Take 60 mg by mouth 2 (two) times daily.     Lactobacillus (ACIDOPHILUS) CAPS capsule Take 1 capsule by mouth daily. (Patient not taking: Reported on 01/05/2021)     LANTUS SOLOSTAR 100 UNIT/ML Solostar Pen Inject 40 Units into the skin 2 (two) times daily. (Patient taking differently: Inject 25 Units into the skin 2 (two) times daily.) 15 mL 11   losartan (COZAAR) 100 MG tablet Take 100 mg by mouth daily.     MEDIUM CHAIN TRIGLYCERIDES PO Take 1,000 mg by mouth daily.     metFORMIN (GLUCOPHAGE) 1000 MG tablet Take 1,000 mg by mouth 2 (two) times daily.      metoprolol (LOPRESSOR) 50 MG tablet Take 50 mg by mouth 2 (two) times daily.     Multiple Vitamins-Minerals (MULTIVITAMIN WITH MINERALS) tablet Take 1 tablet by mouth daily.     nitroGLYCERIN  (NITROSTAT) 0.4 MG SL tablet Place 0.4 mg under the tongue daily as needed.     oxyCODONE-acetaminophen (PERCOCET) 5-325 MG tablet Take 1 tablet by mouth every 6 (six) hours as needed for severe pain. (Patient not taking: No sig reported) 12 tablet 0   OZEMPIC, 0.25 OR 0.5 MG/DOSE, 2 MG/1.5ML SOPN Inject into the skin.     pramipexole (MIRAPEX) 1 MG tablet Take 2 mg by mouth at bedtime.     primidone (MYSOLINE) 250 MG tablet Take 250 mg by mouth 2 (two) times daily.     tamsulosin (FLOMAX) 0.4 MG CAPS capsule Take 0.4 mg by mouth daily.     vitamin B-12 (CYANOCOBALAMIN) 1000 MCG tablet Take 10,000 mcg by mouth  daily. Energy shot     zinc gluconate 50 MG tablet Take 50 mg by mouth daily.      Physical Exam: General: Alert and oriented.  No apparent distress.  Vascular: Difficult to palpate DP and PT pulses bilateral due to swelling present.  Mild to moderate bilateral lower extremity nonpitting edema.  Neuro: Light touch sensation nearly absent to bilateral lower extremities.  Derm: Ulceration subfifth metatarsal phalange joint left foot which measures approximately 0.5 x 0.3 x 0.3 cm and probes close to bone but no obvious bone or capsule exposed at this time.  No odor, no associated erythema, mild serous drainage, no purulence.  MSK: Partial third ray amputation bilateral.  Limited ankle joint dorsiflexion noted with knee extended which is minimally improved knee flexion bilateral.  Results for orders placed or performed during the hospital encounter of 01/03/21 (from the past 48 hour(s))  Glucose, capillary     Status: Abnormal   Collection Time: 01/07/21  9:52 PM  Result Value Ref Range   Glucose-Capillary 286 (H) 70 - 99 mg/dL    Comment: Glucose reference range applies only to samples taken after fasting for at least 8 hours.  CBC     Status: None   Collection Time: 01/08/21  5:33 AM  Result Value Ref Range   WBC 7.7 4.0 - 10.5 K/uL   RBC 4.33 4.22 - 5.81 MIL/uL   Hemoglobin 13.5  13.0 - 17.0 g/dL   HCT 40.2 39.0 - 52.0 %   MCV 92.8 80.0 - 100.0 fL   MCH 31.2 26.0 - 34.0 pg   MCHC 33.6 30.0 - 36.0 g/dL   RDW 14.1 11.5 - 15.5 %   Platelets 153 150 - 400 K/uL   nRBC 0.0 0.0 - 0.2 %    Comment: Performed at [facility], [address], [place], [place] [id number]  Basic metabolic panel     Status: Abnormal   Collection Time: 01/08/21  5:33 AM  Result Value Ref Range   Sodium 136 135 - 145 mmol/L   Potassium 4.0 3.5 - 5.1 mmol/L   Chloride 103 98 - 111 mmol/L   CO2 26 22 - 32 mmol/L   Glucose, Bld 289 (H) 70 - 99 mg/dL    Comment: Glucose reference range applies only to samples taken after fasting for at least 8 hours.   BUN 27 (H) 8 - 23 mg/dL   Creatinine, Ser 0.95 0.61 - 1.24 mg/dL   Calcium 7.6 (L) 8.9 - 10.3 mg/dL   GFR, Estimated >60 >60 mL/min    Comment: (NOTE) Calculated using the CKD-EPI Creatinine Equation (2021)    Anion gap 7 5 - 15    Comment: Performed at [facility], [address] [place], [place] [address]  Glucose, capillary     Status: Abnormal   Collection Time: 01/08/21  7:41 AM  Result Value Ref Range   Glucose-Capillary 267 (H) 70 - 99 mg/dL    Comment: Glucose reference range applies only to samples taken after fasting for at least 8 hours.  Glucose, capillary     Status: Abnormal   Collection Time: 01/08/21 12:04 PM  Result Value Ref Range   Glucose-Capillary 296 (H) 70 - 99 mg/dL    Comment: Glucose reference range applies only to samples taken after fasting for at least 8 hours.  Glucose, capillary     Status: Abnormal   Collection Time: 01/08/21  4:33 PM  Result Value Ref Range   Glucose-Capillary 329 (  H) 70 - 99 mg/dL    Comment: Glucose reference range applies only to samples taken after fasting for at least 8 hours.  Glucose, capillary     Status: Abnormal   Collection Time: 01/08/21  8:56 PM  Result Value Ref Range   Glucose-Capillary 377 (H) 70 - 99 mg/dL    Comment: Glucose reference range  applies only to samples taken after fasting for at least 8 hours.  Glucose, capillary     Status: Abnormal   Collection Time: 01/09/21  8:22 AM  Result Value Ref Range   Glucose-Capillary 260 (H) 70 - 99 mg/dL    Comment: Glucose reference range applies only to samples taken after fasting for at least 8 hours.  Glucose, capillary     Status: Abnormal   Collection Time: 01/09/21 11:44 AM  Result Value Ref Range   Glucose-Capillary 215 (H) 70 - 99 mg/dL    Comment: Glucose reference range applies only to samples taken after fasting for at least 8 hours.  Glucose, capillary     Status: Abnormal   Collection Time: 01/09/21 12:26 PM  Result Value Ref Range   Glucose-Capillary 209 (H) 70 - 99 mg/dL    Comment: Glucose reference range applies only to samples taken after fasting for at least 8 hours.  Glucose, capillary     Status: Abnormal   Collection Time: 01/09/21  4:25 PM  Result Value Ref Range   Glucose-Capillary 146 (H) 70 - 99 mg/dL    Comment: Glucose reference range applies only to samples taken after fasting for at least 8 hours.   No results found.  Blood pressure (!) 104/92, pulse (!) 110, temperature 98.7 F (37.1 C), resp. rate 18, height '6\' 4"'$  (1.93 m), weight (!) 162.8 kg, SpO2 96 %.  Assessment Sepsis secondary to cellulitis left foot secondary to chronic open ulceration left fifth metatarsal phalangeal joint plantar Diabetes type 2 polyneuropathy PVD Equinus bilateral Noncompliance with medical treatment  Plan -Patient seen and examined. -X-ray imaging and MRI imaging reviewed and discussed with patient and patient family in detail.  Does not appear to show osteomyelitis on the MRI.  Appears show diffuse cellulitis though throughout the left foot and swelling. -Swelling and cellulitic changes appear to be improved to the left foot today.  No bone exposed through the area of the ulcerative site. -Wound culture growing gram-positive cocci and gram-positive rods.   Patient's blood culture also positive for strep.  Infectious disease has been consulted, appreciate recommendations for antibiotic therapy. -Unfortunately case delayed further today due to OR emergencies.  Will plan for surgery tomorrow again at 430pm.  NPO order placed for 830 tomorrow.  Diet order placed in the mean time. -Patient's foot remains stable at this time so surgery is not urgently needed at this time.  -Patient is still try to stay at the left foot is much as possible with heel contact and surgical shoe and dressing orders have been placed for changes every other day. -Cardiology has cleared for surgery.  Appreciate recommendations  Caroline More, DPM 01/09/2021, 7:10 PM

## 2021-01-09 NOTE — Progress Notes (Signed)
[facility] Cardiology    SUBJECTIVE: Patient feels reasonably well heart rate somewhat improved mostly below 100 but still in A. fib denies any significant symptoms ready for his surgery   Vitals:   01/08/21 2002 01/08/21 2350 01/09/21 0503 01/09/21 0547  BP: (!) 136/98 107/64 127/83   Pulse: 77 89 69   Resp: '20 20 16   '$ Temp: 97.7 F (36.5 C) 97.8 F (36.6 C) 98.3 F (36.8 C)   TempSrc: Oral     SpO2: 99% 95% 98%   Weight:    (!) 162.8 kg  Height:         Intake/Output Summary (Last 24 hours) at 01/09/2021 0604 Last data filed at 01/08/2021 1820 Gross per 24 hour  Intake 2091.63 ml  Output 0 ml  Net 2091.63 ml      PHYSICAL EXAM  General: Well developed, well nourished, in no acute distress HEENT:  Normocephalic and atramatic Neck:  No JVD.  Lungs: Clear bilaterally to auscultation and percussion. Heart: HRRR . Normal S1 and S2 without gallops or murmurs.  Abdomen: Bowel sounds are positive, abdomen soft and non-tender  Msk:  Back normal, normal gait. Normal strength and tone for age. Extremities: No clubbing, cyanosis or edema.   Neuro: Alert and oriented X 3. Psych:  Good affect, responds appropriately   LABS: Basic Metabolic Panel: Recent Labs    01/08/21 0533  NA 136  K 4.0  CL 103  CO2 26  GLUCOSE 289*  BUN 27*  CREATININE 0.95  CALCIUM 7.6*   Liver Function Tests: No results for input(s): AST, ALT, ALKPHOS, BILITOT, PROT, ALBUMIN in the last 72 hours. No results for input(s): LIPASE, AMYLASE in the last 72 hours. CBC: Recent Labs    01/08/21 0533  WBC 7.7  HGB 13.5  HCT 40.2  MCV 92.8  PLT 153   Cardiac Enzymes: No results for input(s): CKTOTAL, CKMB, CKMBINDEX, TROPONINI in the last 72 hours. BNP: Invalid input(s): POCBNP D-Dimer: No results for input(s): DDIMER in the last 72 hours. Hemoglobin A1C: No results for input(s): HGBA1C in the last 72 hours. Fasting Lipid Panel: No results for input(s): CHOL, HDL, LDLCALC, TRIG, CHOLHDL,  LDLDIRECT in the last 72 hours. Thyroid Function Tests: No results for input(s): TSH, T4TOTAL, T3FREE, THYROIDAB in the last 72 hours.  Invalid input(s): FREET3 Anemia Panel: No results for input(s): VITAMINB12, FOLATE, FERRITIN, TIBC, IRON, RETICCTPCT in the last 72 hours.  No results found.   Echo preserved overall left ventricular function at 60  TELEMETRY: Atrial fibrillation rate around 100:  ASSESSMENT AND PLAN:  Principal Problem:   Sepsis due to cellulitis [facility]) Active Problems:   Diabetes mellitus type 2 in obese (HCC)   CAD (coronary artery disease)   CAD (coronary artery disease), autologous vein bypass graft   Pure hypercholesterolemia   RLS (restless legs syndrome)   Chronic osteomyelitis of left foot (HCC)   Osteomyelitis ([place])    Plan Heart rate somewhat improved average of around 100 still in A. Fib Slightly low blood pressure we will give a 500 cc fluid bolus Will reduce metoprolol for now to 50 mg twice a day Continue Cardizem drip to help with rate control Patient feels reasonably well Will clear the patient has a mild to moderate risk for his foot surgery hopefully that can be done today   Yolonda Kida, MD 01/09/2021 6:04 AM

## 2021-01-09 NOTE — Progress Notes (Signed)
Patient was transferred back to floor; room 258, per Anesthesiologist Dr Andree Elk  due to timing of procedure.  Report given to floor RN Caryl Pina

## 2021-01-09 NOTE — Care Management Important Message (Signed)
Important Message  Patient Details  Name: [name]. MRN: [id number] Date of Birth: 08/15/1955   Medicare Important Message Given:  Yes     [name] 01/09/2021, 1:07 PM

## 2021-01-10 ENCOUNTER — Inpatient Hospital Stay: Payer: Medicare Other | Admitting: Anesthesiology

## 2021-01-10 ENCOUNTER — Encounter: Payer: Self-pay | Admitting: Family Medicine

## 2021-01-10 ENCOUNTER — Inpatient Hospital Stay: Payer: Medicare Other

## 2021-01-10 ENCOUNTER — Encounter
Admission: EM | Disposition: A | Discharge status: Skilled Nursing Facility | Payer: Self-pay | Source: Home / Self Care | Attending: Internal Medicine

## 2021-01-10 DIAGNOSIS — A419 Sepsis, unspecified organism: Secondary | ICD-10-CM | POA: Diagnosis not present

## 2021-01-10 DIAGNOSIS — L0390 Cellulitis, unspecified: Secondary | ICD-10-CM | POA: Diagnosis not present

## 2021-01-10 HISTORY — PX: ACHILLES TENDON SURGERY: SHX542

## 2021-01-10 HISTORY — PX: METATARSAL HEAD EXCISION: SHX5027

## 2021-01-10 LAB — GLUCOSE, CAPILLARY
Glucose-Capillary: 380 mg/dL — ABNORMAL HIGH (ref 70–99)
Glucose-Capillary: 317 mg/dL — ABNORMAL HIGH (ref 70–99)
Glucose-Capillary: 162 mg/dL — ABNORMAL HIGH (ref 70–99)
Glucose-Capillary: 149 mg/dL — ABNORMAL HIGH (ref 70–99)
Glucose-Capillary: 234 mg/dL — ABNORMAL HIGH (ref 70–99)

## 2021-01-10 LAB — CULTURE, BLOOD (SINGLE): Special Requests: ADEQUATE

## 2021-01-10 LAB — AEROBIC/ANAEROBIC CULTURE W GRAM STAIN (SURGICAL/DEEP WOUND)

## 2021-01-10 SURGERY — KIDNER PROCEDURE, MODIFIED
Anesthesia: General | Site: Toe | Laterality: Left

## 2021-01-10 MED ORDER — METOPROLOL TARTRATE 5 MG/5ML IV SOLN: 5.0000 mg | Freq: Three times a day (TID) | INTRAVENOUS | Status: DC | PRN

## 2021-01-10 MED ORDER — ONDANSETRON HCL 4 MG/2ML IJ SOLN: 4.0000 mg | Freq: Once | INTRAMUSCULAR | Status: DC | PRN

## 2021-01-10 MED ORDER — INSULIN GLARGINE-YFGN 100 UNIT/ML ~~LOC~~ SOLN
40.0000 [IU] | Freq: Two times a day (BID) | SUBCUTANEOUS | Status: DC
  Administered 2021-01-10 – 2021-01-11 (×2): 40 [IU] via SUBCUTANEOUS
  Filled 2021-01-10 (×3): qty 0.4

## 2021-01-10 MED ORDER — PROPOFOL 10 MG/ML IV BOLUS
INTRAVENOUS | Status: AC
  Filled 2021-01-10: qty 20

## 2021-01-10 MED ORDER — FENTANYL CITRATE (PF) 100 MCG/2ML IJ SOLN
INTRAMUSCULAR | Status: AC
  Filled 2021-01-10: qty 2

## 2021-01-10 MED ORDER — PROPOFOL 500 MG/50ML IV EMUL
INTRAVENOUS | Status: DC | PRN
  Administered 2021-01-10: 150 ug/kg/min via INTRAVENOUS

## 2021-01-10 MED ORDER — 0.9 % SODIUM CHLORIDE (POUR BTL) OPTIME
TOPICAL | Status: DC | PRN
  Administered 2021-01-10: 200 mL

## 2021-01-10 MED ORDER — BUPIVACAINE HCL (PF) 0.5 % IJ SOLN
INTRAMUSCULAR | Status: DC | PRN
  Administered 2021-01-10: 20 mL

## 2021-01-10 MED ORDER — BUPIVACAINE HCL (PF) 0.5 % IJ SOLN
INTRAMUSCULAR | Status: AC
  Filled 2021-01-10: qty 30

## 2021-01-10 MED ORDER — FENTANYL CITRATE (PF) 100 MCG/2ML IJ SOLN: 25.0000 ug | INTRAMUSCULAR | Status: DC | PRN

## 2021-01-10 SURGICAL SUPPLY — 46 items
BAG COUNTER SPONGE SURGICOUNT (BAG) ×1 IMPLANT
BLADE OSC/SAGITTAL MD 9X18.5 (BLADE) ×3 IMPLANT
BLADE SURG 15 STRL LF DISP TIS (BLADE) ×4 IMPLANT
BLADE SURG 15 STRL SS (BLADE) ×2
BNDG COHESIVE 4X5 TAN ST LF (GAUZE/BANDAGES/DRESSINGS) ×3 IMPLANT
BNDG ELASTIC 4X5.8 VLCR NS LF (GAUZE/BANDAGES/DRESSINGS) ×6 IMPLANT
BNDG ELASTIC 4X5.8 VLCR STR LF (GAUZE/BANDAGES/DRESSINGS) ×3 IMPLANT
BNDG ELASTIC 6X5.8 VLCR STR LF (GAUZE/BANDAGES/DRESSINGS) ×3 IMPLANT
BNDG GAUZE ELAST 4 BULKY (GAUZE/BANDAGES/DRESSINGS) ×5 IMPLANT
BNDG STRETCH 4X75 STRL LF (GAUZE/BANDAGES/DRESSINGS) ×3 IMPLANT
BOOT STEPPER DURA XLG (SOFTGOODS) ×1 IMPLANT
COVER LIGHT HANDLE STERIS (MISCELLANEOUS) ×6 IMPLANT
DRSG MEPILEX FLEX 3X3 (GAUZE/BANDAGES/DRESSINGS) IMPLANT
ELECT REM PT RETURN 9FT ADLT (ELECTROSURGICAL) ×3
ELECTRODE REM PT RTRN 9FT ADLT (ELECTROSURGICAL) ×2 IMPLANT
GAUZE 4X4 16PLY ~~LOC~~+RFID DBL (SPONGE) ×3 IMPLANT
GAUZE SPONGE 4X4 12PLY STRL (GAUZE/BANDAGES/DRESSINGS) ×3 IMPLANT
GAUZE XEROFORM 1X8 LF (GAUZE/BANDAGES/DRESSINGS) ×5 IMPLANT
GLOVE SURG ENC MOIS LTX SZ7 (GLOVE) ×3 IMPLANT
GLOVE SURG UNDER LTX SZ7 (GLOVE) ×3 IMPLANT
GLOVE SURG UNDER POLY LF SZ7 (GLOVE) ×3 IMPLANT
GOWN STRL REUS W/ TWL LRG LVL3 (GOWN DISPOSABLE) ×4 IMPLANT
GOWN STRL REUS W/TWL LRG LVL3 (GOWN DISPOSABLE) ×2
KIT TURNOVER KIT A (KITS) ×3 IMPLANT
MANIFOLD NEPTUNE II (INSTRUMENTS) ×3 IMPLANT
NDL HYPO 25X1 1.5 SAFETY (NEEDLE) ×4 IMPLANT
NDL SAFETY ECLIPSE 18X1.5 (NEEDLE) ×2 IMPLANT
NEEDLE HYPO 18GX1.5 SHARP (NEEDLE) ×1
NEEDLE HYPO 25X1 1.5 SAFETY (NEEDLE) ×6 IMPLANT
NS IRRIG 500ML POUR BTL (IV SOLUTION) ×3 IMPLANT
PACK EXTREMITY ARMC (MISCELLANEOUS) ×3 IMPLANT
PAD ABD DERMACEA PRESS 5X9 (GAUZE/BANDAGES/DRESSINGS) ×1 IMPLANT
PENCIL SMOKE EVACUATOR (MISCELLANEOUS) ×3 IMPLANT
STOCKINETTE IMPERVIOUS 9X36 MD (GAUZE/BANDAGES/DRESSINGS) ×3 IMPLANT
SUT ETHILON 2 0 FS 18 (SUTURE) ×6 IMPLANT
SUT ETHILON 2 0 FSLX (SUTURE) ×3 IMPLANT
SUT ETHILON 3-0 FS-10 30 BLK (SUTURE) ×6
SUT ETHILON NAB PS2 4-0 18IN (SUTURE) ×3 IMPLANT
SUT VIC AB 2-0 CT1 27 (SUTURE) ×1
SUT VIC AB 2-0 CT1 TAPERPNT 27 (SUTURE) ×2 IMPLANT
SUT VIC AB 2-0 SH 27 (SUTURE) ×1
SUT VIC AB 2-0 SH 27XBRD (SUTURE) ×2 IMPLANT
SUT VIC AB 3-0 SH 27 (SUTURE) ×2
SUT VIC AB 3-0 SH 27X BRD (SUTURE) ×4 IMPLANT
SUTURE EHLN 3-0 FS-10 30 BLK (SUTURE) ×4 IMPLANT
SYR 10ML LL (SYRINGE) ×6 IMPLANT

## 2021-01-10 NOTE — Op Note (Signed)
PODIATRY / FOOT AND ANKLE SURGERY OPERATIVE REPORT    SURGEON: Caroline More, DPM  PRE-OPERATIVE DIAGNOSIS:  1.  Diabetic foot ulceration left plantar fifth metatarsal phalangeal joint 2.  Equinus left lower extremity 3.  PVD 4.  Diabetes type 2 polyneuropathy 5.  History of noncompliance  POST-OPERATIVE DIAGNOSIS: Same  PROCEDURE(S): Left fifth metatarsal head resection Tendo Achilles lengthening left Wound debridement left plantar fifth metatarsal phalangeal joint 100% excisional subcutaneous  HEMOSTASIS: Left high ankle tourniquet  ANESTHESIA: MAC  ESTIMATED BLOOD LOSS: 10 cc  FINDING(S): 1.  Wound to the plantar aspect the left fifth metatarsal phalangeal joint measured approximately 1 cm x 0.8 cm x 0.4 cm, no exposed capsule or bone. 2.  Fairly normal-appearing morphology to the fifth metatarsal head, no evidence of osteomyelitis  PATHOLOGY/SPECIMEN(S): Left fifth metatarsal head with proximal margin marked in purple ink  INDICATIONS:   [name]. is a 65 y.o. male who presents with a nonhealing ulceration to the plantar aspect of the left fifth metatarsal phalangeal joint.  Patient's has been noncompliant with instructions overall.  Patient has a long history of diabetic foot ulcerations and subsequent amputations.  Patient has been treated for numerous blisters that have occurred to bilateral lower extremities due to uncontrolled swelling also to bilateral lower extremities.  Patient has not been compliant with wound care instructions or with compression dressing instructions.  Patient was sent to the wound care center for further evaluation and management as the wound to the plantar fifth metatarsal phalange joint appeared to be stagnant despite offloading measures including total contact casting, grafts, padding, strapping, taping and local wound care.  Patient went to the wound care center and was receiving Profore dressings but then subsequently started feeling  ill so went to the emergency room and was septic with bacteria in his blood.  Unknown source but could be due to multiple ulcerations to bilateral lower extremities as well as left fifth plantar metatarsal phalangeal joint ulceration.  X-ray imaging did show potential for osteomyelitis of the fifth metatarsal head but MRI imaging did not confirm that.  Discussed all treatment options with patient both conservative and surgical attempts at correction include potential risks and complications at this time they have elected for surgical intervention consisting of left tendo Achilles lengthening and fifth metatarsal head resection with debridement of wound and potential closure of wound.  All questions answered including postoperative course in detail, no guarantees given.  Discussed with patient that since he is very noncompliant and uncontrolled diabetic that he will likely have difficulty healing this wound as he has had with previous wounds in the past.  Patient understands..  DESCRIPTION: After obtaining full informed written consent, the patient was brought back to the operating room and placed supine upon the operating table.  The patient received IV antibiotics prior to induction.  After obtaining adequate anesthesia, 1/5 ray block and Achilles tendon block were performed with 20 cc of half percent Marcaine plain.  The patient was prepped and draped in the standard fashion.  An Esmarch bandage was used to exsanguinate the left lower extremity pneumatic ankle tourniquet was inflated.  Attention was directed to the posterior aspect of the left heel where incisions were marked out for the tendo Achilles lengthening.  3 stab incisions were made along the central aspect of the Achilles tendon with approximately 3 to 4 cm between each incision.  The most distal incision was directed centrally and the blade was turned medially the central incision was  directed centrally and scooped laterally and the most proximal  incision was placed centrally and moved medially thereby hemisected and the Achilles tendon in 3 different areas.  Increased dorsiflexion was noted across the ankle joint to approximately 10 degrees with the knee extended indicating successful lengthening of the Achilles tendon.  The stab incisions were then reapproximated well coapted with skin staples.  Attention was then directed to the dorsal aspect of the fifth metatarsal phalangeal of the left foot where a linear longitudinal incision was made slightly lateral to the tendon of the extensor digitorum longus.  The incision was deepened to the subcutaneous tissues utilizing sharp and blunt dissection and care was taken to identify and retract all vital neurovascular structures no venous contributories were cauterized as necessary.  At this time a capsular and periosteal incision was made lateral to the tendon of the extensor digitorum longus and the periosteum and capsule was reflected medially and laterally thereby exposing the fifth metatarsal head at the operative site.  The sagittal bone saw was then used to create an osteotomy at the distal shaft of the fifth metatarsal with the appropriate beveling leaving more dorsal medial than plantar lateral.  This osteotomy was made through and through and the fifth metatarsal head was dissected free and removed from the operative site.  The proximal margin was marked in purple ink.  The fifth metatarsal head appeared to have normal morphology overall and did not have any signs or symptoms consistent with osteomyelitis.  This was passed off in the operative site and sent off to pathology.  The surgical site was flushed with copious amounts normal sterile saline.  The periosteal and capsular structures were reapproximated well coapted with 3-0 Vicryl along with subcutaneous tissues.  The skin was then reapproximated well coapted with 3-0 nylon in a combination of simple and horizontal mattress type  stitching.  Attention was then directed to the plantar left fifth metatarsal phalangeal joints where the wound was measured to be approximately 1 cm x 0.8 cm x 0.4 cm predebridement and appeared to be the same postdebridement.  100% excisional subcutaneous wound debridement was performed with 15 blade without incident.  The base of the wound appeared to be healthy and granular overall after debridement.  The wound was flushed with copious amounts normal sterile saline.  Retention sutures were then placed across the wound area to pull the wound back together and wound margins appear to be well coapted overall with 3-0 nylon.  Postoperative dressing was then applied consisting of Xeroform to the incision lines followed by 4 x 4 gauze, ABD, Kerlix, Ace wrap.  The pneumatic ankle tourniquet was deflated and a prompt hyperemic response was noted all digits left foot.  The patient tolerated the procedure and anesthesia well was transferred to recovery room vital signs stable vascular status intact all toes left foot.  Patient was placed in a postoperative boot.  Patient should remain nonweightbearing to left lower extremity at all times.  PT and OT orders placed.  Patient is on bedrest currently likely due to A. fib with RVR and uncontrolled heart rate.  Would defer to cardiology for further work-up and management.  Once again patient will have to be nonweightbearing for potentially 4 to 6 weeks due to Achilles tendon lengthening and wound present to the plantar aspect of the left fifth metatarsal phalangeal joint.  We will follow-up with patient again tomorrow.  Believe that all infection to the area was likely removed with debridement of wound and  with resection of fifth metatarsal phalangeal joint.  Defer to infectious disease for further antibiotic management.    COMPLICATIONS: None  CONDITION: Good, stable  Caroline More, DPM

## 2021-01-10 NOTE — Progress Notes (Signed)
Inpatient Diabetes Program Recommendations  AACE/ADA: New Consensus Statement on Inpatient Glycemic Control   Target Ranges:  Prepandial:   less than 140 mg/dL      Peak postprandial:   less than 180 mg/dL (1-2 hours)      Critically ill patients:  140 - 180 mg/dL   Results for Hladik, LORD BADLEY" (MRN [id number]) as of 01/10/2021 10:34  Ref. Range 01/09/2021 08:22 01/09/2021 11:44 01/09/2021 12:26 01/09/2021 16:25 01/09/2021 22:21 01/10/2021 09:27  Glucose-Capillary Latest Ref Range: 70 - 99 mg/dL 260 (H)  Novolog 15 units 215 (H) 209 (H)  Novolog 12 units 146 (H) 295 (H)  Novolog 3 units 380 (H)  Novolog 22 units   Review of Glycemic Control  Diabetes history: DM2 Outpatient Diabetes medications: Lantus 25 units QHS, Novolog 18 units TID with meals, Metformin 1000 mg BID, Ozempic 1 mg Qweek Current orders for Inpatient glycemic control: Semglee 15 units BID, Novolog 7 units TID with meals, Novolog 0-15 units TID with meals, Novolog 0-5 units QHS   Inpatient Diabetes Program Recommendations:     Insulin: Please consider increasing Semglee to 40 units BID   Tama Headings RN, MSN, BC-ADM Inpatient Diabetes Coordinator Team Pager [phone] (8a-5p)

## 2021-01-10 NOTE — Transfer of Care (Signed)
Immediate Anesthesia Transfer of Care Note  Patient: [name].  Procedure(s) Performed: ACHILLES LENGTHENING/KIDNER (Left) METATARSAL HEAD EXCISION - LEFT 5th (Left: Toe)  Patient Location: PACU  Anesthesia Type:MAC  Level of Consciousness: sedated  Airway & Oxygen Therapy: Patient Spontanous Breathing and Patient connected to face mask oxygen  Post-op Assessment: Report given to RN and Post -op Vital signs reviewed and stable  Post vital signs: Reviewed and stable  Last Vitals:  Vitals Value Taken Time  BP 84/60 01/10/21 1745  Temp    Pulse 66 01/10/21 1748  Resp 16 01/10/21 1748  SpO2 96 % 01/10/21 1748  Vitals shown include unvalidated device data.  Last Pain:  Vitals:   01/10/21 1611  TempSrc: Tympanic  PainSc: 0-No pain      Patients Stated Pain Goal: 0 (73/57/89 [id number])  Complications: No notable events documented.

## 2021-01-10 NOTE — H&P (Signed)
HISTORY AND PHYSICAL INTERVAL NOTE:  01/10/2021  4:09 PM  [name].  has presented today for surgery, with the diagnosis of osteomyelitis, equinus, wound left foot.  The various methods of treatment have been discussed with the patient.  No guarantees were given.  After consideration of risks, benefits and other options for treatment, the patient has consented to surgery.  I have reviewed the patients' chart and labs.    PROCEDURE: ALL LEFT FOOT TENDOACHILLES LENGTHENING 5TH METATARSAL HEAD RESECTION HEAD RESECTION  A history and physical examination was performed in my the hospital.  The patient was reexamined.  There have been no changes to this history and physical examination.  Caroline More, DPM

## 2021-01-10 NOTE — Progress Notes (Signed)
[place] [facility] [place] NAME: [name]    MR#:  [id number]  DATE OF BIRTH:  December 03, 1955  SUBJECTIVE:  patient currently on IV diltiazem drip for intermittent tachycardia. His scheduled for podiatry for fifth ray debridement this afternoon. Got cancelled due to emergency surgeries yday  REVIEW OF SYSTEMS:   Review of Systems  Constitutional:  Negative for chills, fever and weight loss.  HENT:  Negative for ear discharge, ear pain and nosebleeds.   Eyes:  Negative for blurred vision, pain and discharge.  Respiratory:  Negative for sputum production, shortness of breath, wheezing and stridor.   Cardiovascular:  Negative for chest pain, palpitations, orthopnea and PND.  Gastrointestinal:  Negative for abdominal pain, diarrhea, nausea and vomiting.  Genitourinary:  Negative for frequency and urgency.  Musculoskeletal:  Positive for joint pain. Negative for back pain.  Neurological:  Negative for sensory change, speech change, focal weakness and weakness.  Psychiatric/Behavioral:  Negative for depression and hallucinations. The patient is not nervous/anxious.   Tolerating Diet:npo Tolerating PT:   DRUG ALLERGIES:   Allergies  Allergen Reactions  . Statins     Other reaction(s): Muscle Pain Causes legs to ache per pt    VITALS:  Blood pressure 123/85, pulse 99, temperature 98.6 F (37 C), resp. rate 18, height '6\' 4"'$  (1.93 m), weight (!) 162.8 kg, SpO2 96 %.  PHYSICAL EXAMINATION:   Physical Exam  GENERAL:  65 y.o.-year-old patient lying in the bed with no acute distress. obese LUNGS: Normal breath sounds bilaterally, no wheezing, rales, rhonchi. No use of accessory muscles of respiration.  CARDIOVASCULAR: S1, S2 normal. No murmurs, rubs, or gallops. Irregularly irregular ABDOMEN: Soft, nontender, nondistended. Bowel sounds present. No organomegaly or mass.  EXTREMITIES:   bilateral chronic venous stasis changes. Left foot wrapped  with  dressing NEUROLOGIC: non focal PSYCHIATRIC:  patient is alert and oriented x 3.  SKIN: as above  LABORATORY PANEL:  CBC Recent Labs  Lab 01/08/21 0533  WBC 7.7  HGB 13.5  HCT 40.2  PLT 153     Chemistries  Recent Labs  Lab 01/05/21 0726 01/08/21 0533  NA 135 136  K 4.1 4.0  CL 100 103  CO2 27 26  GLUCOSE 325* 289*  BUN 19 27*  CREATININE 0.97 0.95  CALCIUM 8.1* 7.6*  MG 2.0  --   AST 13*  --   ALT 13  --   ALKPHOS 78  --   BILITOT 0.5  --     Cardiac Enzymes No results for input(s): TROPONINI in the last 168 hours. RADIOLOGY:  No results found. ASSESSMENT AND PLAN:  65 year old male with history of hypertension, congestive heart failure, hyperlipidemia, diabetes type 2, paroxysmal A. fib, chronic ulcer on the left foot presented to the emergency department with complaints of fever, chills.  On presentation he was febrile with temperature of 104, he was tachycardic.  He was found to have worsening ulcer of the left foot  Left foot cellulitis/left foot chronic ulcer/streptococcal bacteremia:  --Presented with worsening left foot ulcer.   --MRI without signs of osteomyelitis but showed extensive cellulitis/myositis. --Blood cultures showed group B streptococcus.   --Currently on Unasyn.  ID following.  Repeat blood cultures have been negative. --Podiatry Dr Luana Shu plans for ray amputation after stabilization in heart rate.  No need of TEE as per ID.  TTE did not show any vegetation. --Vascular surgery was also following during this hospitalization and he underwent angiogram --  9/13--pt for left foot surgery today   New onset A. fib with RVR:  --[facility] following.  -- pt was on amiodarone drip which has been changed to oral.  --Still in A. fib with the RVR -- now on Cardizem drip.   --cont digoxin, metoprolol -- on Eliquis for anticoagulation --held for foot procedure   Diabetes type 2: Recent hemoglobin A1c  of 8.9.   --Continue current insulin  regimen and makes changes per Diabetes coordinator   History of coronary artery disease: No anginal symptoms.  -- On Plavix, isosorbide,statin.   Hypertension: Currently stable. On metoprolol, IV Cardizem drip  BPH: On Flomax   Morbid obesity: BMI 43.47         DVT prophylaxis:Eliquis Code Status: Full Family Communication:wife at the bedside 9/12 Status is: Inpatient   Remains inpatient appropriate because:Inpatient level of care appropriate due to severity of illness   Dispo: The patient is from: Home              Anticipated d/c is to: Home              Patient currently is not medically stable to d/c.              Difficult to place patient No      TOTAL TIME TAKING CARE OF THIS PATIENT: 25 minutes.  >50% time spent on counselling and coordination of care  Note: This dictation was prepared with Dragon dictation along with smaller phrase technology. Any transcriptional errors that result from this process are unintentional.  Fritzi Mandes M.D    [facility]   CC: Primary care physician; [name], MD Patient ID: Alec Less., male   DOB: [birth year], 66 y.o.   MRN: [id number]

## 2021-01-10 NOTE — Anesthesia Postprocedure Evaluation (Signed)
Anesthesia Post Note  Patient: [name].  Procedure(s) Performed: ACHILLES LENGTHENING/KIDNER (Left) METATARSAL HEAD EXCISION - LEFT 5th (Left: Toe)  Patient location during evaluation: PACU Anesthesia Type: General Level of consciousness: awake and alert Pain management: pain level controlled Vital Signs Assessment: post-procedure vital signs reviewed and stable Respiratory status: spontaneous breathing, nonlabored ventilation, respiratory function stable and patient connected to nasal cannula oxygen Cardiovascular status: blood pressure returned to baseline and stable Postop Assessment: no apparent nausea or vomiting Anesthetic complications: no   No notable events documented.   Last Vitals:  Vitals:   01/10/21 1815 01/10/21 1845  BP: 120/70 127/85  Pulse: 93 (!) 108  Resp: (!) 27 14  Temp: (!) 36.3 C (!) 36.3 C  SpO2: 93% 95%    Last Pain:  Vitals:   01/10/21 1815  TempSrc:   PainSc: 0-No pain                 Martha Clan

## 2021-01-10 NOTE — Progress Notes (Signed)
[facility] Card[facility]    SUBJECTIVE: Patient states to be doing reasonably well awaiting surgery denies any palpitations or tachycardia   Vitals:   01/10/21 0011 01/10/21 0330 01/10/21 0938 01/10/21 1222  BP: 98/76 97/74 104/77 123/85  Pulse:   (!) 117 99  Resp: '17 18  18  '$ Temp: 98.8 F (37.1 C) 98.3 F (36.8 C)  98.6 F (37 C)  TempSrc: Oral Oral    SpO2: 95% 95% 96% 96%  Weight:      Height:         Intake/Output Summary (Last 24 hours) at 01/10/2021 1245 Last data filed at 01/09/2021 2300 Gross per 24 hour  Intake 1198.48 ml  Output 1050 ml  Net 148.48 ml      PHYSICAL EXAM  General: Well developed, well nourished, in no acute distress HEENT:  Normocephalic and atramatic Neck:  No JVD.  Lungs: Clear bilaterally to auscultation and percussion. Heart: Irregularly irregular. Normal S1 and S2 without gallops or murmurs.  Abdomen: Bowel sounds are positive, abdomen soft and non-tender  Msk:  Back normal, normal gait. Normal strength and tone for age. Extremities: No clubbing, cyanosis or edema.  Bandage leg with wound Neuro: Alert and oriented X 3. Psych:  Good affect, responds appropriately   LABS: Basic Metabolic Panel: Recent Labs    01/08/21 0533  NA 136  K 4.0  CL 103  CO2 26  GLUCOSE 289*  BUN 27*  CREATININE 0.95  CALCIUM 7.6*   Liver Function Tests: No results for input(s): AST, ALT, ALKPHOS, BILITOT, PROT, ALBUMIN in the last 72 hours. No results for input(s): LIPASE, AMYLASE in the last 72 hours. CBC: Recent Labs    01/08/21 0533  WBC 7.7  HGB 13.5  HCT 40.2  MCV 92.8  PLT 153   Cardiac Enzymes: No results for input(s): CKTOTAL, CKMB, CKMBINDEX, TROPONINI in the last 72 hours. BNP: Invalid input(s): POCBNP D-Dimer: No results for input(s): DDIMER in the last 72 hours. Hemoglobin A1C: No results for input(s): HGBA1C in the last 72 hours. Fasting Lipid Panel: No results for input(s): CHOL, HDL, LDLCALC, TRIG, CHOLHDL, LDLDIRECT in the  last 72 hours. Thyroid Function Tests: No results for input(s): TSH, T4TOTAL, T3FREE, THYROIDAB in the last 72 hours.  Invalid input(s): FREET3 Anemia Panel: No results for input(s): VITAMINB12, FOLATE, FERRITIN, TIBC, IRON, RETICCTPCT in the last 72 hours.  No results found.   Echo preserved left ventricular function 60%  TELEMETRY: Rapid atrial fibrillation rate of 120 nonspecific ST-T changes:  ASSESSMENT AND PLAN:  Principal Problem:   Sepsis due to cellulitis [facility]) Active Problems:   Diabetes mellitus type 2 in obese (HCC)   CAD (coronary artery disease)   CAD (coronary artery disease), autologous vein bypass graft   Pure hypercholesterolemia   RLS (restless legs syndrome)   Chronic osteomyelitis of left foot ([place])   Osteomyelitis ([place])   Plan Patient still has significant tachycardia with atrial fibrillation better controlled with Cardizem IV dose of metoprolol was also given to help Recommend IV metoprolol as needed every hour to help with tachycardia Once surgery is completed we will consider cardioversion Continue diabetes management and control Agree with antibiotic therapy for cellulitis abscess Agree with surgical procedure to help with osteomyelitis Will continue to work on controlling A. fib and heart rate   Yolonda Kida, MD 01/10/2021 12:45 PM

## 2021-01-10 NOTE — Anesthesia Preprocedure Evaluation (Signed)
Anesthesia Evaluation  Patient identified by MRN, date of birth, ID band Patient awake    Reviewed: Allergy & Precautions, H&P , NPO status , Patient's Chart, lab work & pertinent test results, reviewed documented beta blocker date and time   History of Anesthesia Complications (+) AWARENESS UNDER ANESTHESIA and history of anesthetic complications  Airway Mallampati: II  TM Distance: >3 FB Neck ROM: full    Dental  (+) Edentulous Upper, Upper Dentures, Missing, Dental Advidsory Given, Poor Dentition, Caps   Pulmonary neg shortness of breath, asthma , sleep apnea and Continuous Positive Airway Pressure Ventilation , COPD, neg recent URI, Current Smoker, former smoker,    Pulmonary exam normal        Cardiovascular Exercise Tolerance: Good hypertension, (-) angina+ CAD, + Past MI, + Cardiac Stents, + CABG and + Peripheral Vascular Disease  Normal cardiovascular exam(-) dysrhythmias (-) Valvular Problems/Murmurs     Neuro/Psych PSYCHIATRIC DISORDERS Depression negative neurological ROS     GI/Hepatic negative GI ROS, Neg liver ROS,   Endo/Other  diabetesMorbid obesity  Renal/GU Renal disease     Musculoskeletal   Abdominal   Peds  Hematology negative hematology ROS (+)   Anesthesia Other Findings Past Medical History: No date: Asthma No date: Coronary artery disease No date: Depression No date: Diabetes mellitus without complication (HCC) No date: Gout No date: History anabolic steroid use No date: Hyperlipidemia No date: Hypertension No date: Hypogonadism in male No date: Morbid obesity (HCC) No date: Myocardial infarction ([place]) No date: Peripheral vascular disease ([place]) No date: Perirectal abscess No date: Pleurisy No date: Sleep apnea     Comment:  CPAP at night, no oxygen No date: Varicella   Reproductive/Obstetrics negative OB ROS                             Anesthesia  Physical  Anesthesia Plan  ASA: 4 and emergent  Anesthesia Plan: General   Post-op Pain Management:    Induction: Intravenous  PONV Risk Score and Plan: 2 and Propofol infusion and TIVA  Airway Management Planned: Natural Airway and Simple Face Mask  Additional Equipment:   Intra-op Plan:   Post-operative Plan:   Informed Consent: I have reviewed the patients History and Physical, chart, labs and discussed the procedure including the risks, benefits and alternatives for the proposed anesthesia with the patient or authorized representative who has indicated his/her understanding and acceptance.     Dental Advisory Given  Plan Discussed with: CRNA  Anesthesia Plan Comments: (Patient consented for risks of anesthesia including but not limited to:  - adverse reactions to medications - risk of intubation if required - damage to eyes, teeth, lips or other oral mucosa - nerve damage due to positioning  - sore throat or hoarseness - Damage to heart, brain, nerves, lungs, other parts of body or loss of life  Patient voiced understanding.)        Anesthesia Quick Evaluation

## 2021-01-11 ENCOUNTER — Ambulatory Visit: Payer: Medicare Other | Admitting: Internal Medicine

## 2021-01-11 DIAGNOSIS — I25810 Atherosclerosis of coronary artery bypass graft(s) without angina pectoris: Secondary | ICD-10-CM

## 2021-01-11 DIAGNOSIS — I4891 Unspecified atrial fibrillation: Secondary | ICD-10-CM | POA: Diagnosis not present

## 2021-01-11 DIAGNOSIS — L0390 Cellulitis, unspecified: Secondary | ICD-10-CM | POA: Diagnosis not present

## 2021-01-11 DIAGNOSIS — E1169 Type 2 diabetes mellitus with other specified complication: Secondary | ICD-10-CM | POA: Diagnosis not present

## 2021-01-11 LAB — CULTURE, BLOOD (ROUTINE X 2)
Culture: NO GROWTH
Culture: NO GROWTH
Special Requests: ADEQUATE
Special Requests: ADEQUATE

## 2021-01-11 LAB — GLUCOSE, CAPILLARY
Glucose-Capillary: 248 mg/dL — ABNORMAL HIGH (ref 70–99)
Glucose-Capillary: 312 mg/dL — ABNORMAL HIGH (ref 70–99)
Glucose-Capillary: 341 mg/dL — ABNORMAL HIGH (ref 70–99)
Glucose-Capillary: 398 mg/dL — ABNORMAL HIGH (ref 70–99)

## 2021-01-11 LAB — DIGOXIN LEVEL: Digoxin Level: 0.6 ng/mL — ABNORMAL LOW (ref 0.8–2.0)

## 2021-01-11 MED ORDER — APIXABAN 5 MG PO TABS
5.0000 mg | ORAL_TABLET | Freq: Two times a day (BID) | ORAL | Status: DC
  Administered 2021-01-11 – 2021-01-19 (×16): 5 mg via ORAL
  Filled 2021-01-11 (×16): qty 1

## 2021-01-11 MED ORDER — INSULIN GLARGINE-YFGN 100 UNIT/ML ~~LOC~~ SOLN
42.0000 [IU] | Freq: Two times a day (BID) | SUBCUTANEOUS | Status: DC
  Administered 2021-01-11 – 2021-01-13 (×4): 42 [IU] via SUBCUTANEOUS
  Filled 2021-01-11 (×5): qty 0.42

## 2021-01-11 MED ORDER — INSULIN ASPART 100 UNIT/ML IJ SOLN
9.0000 [IU] | Freq: Three times a day (TID) | INTRAMUSCULAR | Status: DC
  Administered 2021-01-11 – 2021-01-13 (×6): 9 [IU] via SUBCUTANEOUS
  Filled 2021-01-11 (×6): qty 1

## 2021-01-11 MED ORDER — APIXABAN 5 MG PO TABS: 5.0000 mg | ORAL_TABLET | Freq: Two times a day (BID) | ORAL | Status: DC

## 2021-01-11 NOTE — Progress Notes (Signed)
Inpatient Diabetes Program Recommendations  AACE/ADA: New Consensus Statement on Inpatient Glycemic Control (2015)  Target Ranges:  Prepandial:   less than 140 mg/dL      Peak postprandial:   less than 180 mg/dL (1-2 hours)      Critically ill patients:  140 - 180 mg/dL   Lab Results  Component Value Date   GLUCAP 312 (H) 01/11/2021   HGBA1C 8.9 (H) 01/04/2021    Review of Glycemic Control Results for [name], [name]" (MRN [id number]) as of 01/11/2021 12:05  Ref. Range 01/10/2021 16:11 01/10/2021 17:54 01/10/2021 21:17 01/11/2021 07:55 01/11/2021 11:50  Glucose-Capillary Latest Ref Range: 70 - 99 mg/dL 162 (H) 149 (H) 234 (H) 248 (H) 312 (H)   Diabetes history: DM2 Outpatient Diabetes medications: Lantus 25 units QHS, Novolog 18 units TID with meals, Metformin 1000 mg BID, Ozempic 1 mg Qweek Current orders for Inpatient glycemic control: Semglee 40 units BID, Novolog 7 units TID with meals, Novolog 0-15 units TID with meals, Novolog 0-5 units QHS  Inpatient Diabetes Program Recommendations:   Postprandial CBGs elevated. Consider: -Increase Novolog meal coverage to 9 units tid if eats 50% Secure chat sent to Dr. [place] Pink.  Thank you, Nani Gasser. [name], RN, MSN, CDE  Diabetes Coordinator Inpatient Glycemic Control Team Team Pager [phone] (8am-5pm) 01/11/2021 12:07 PM

## 2021-01-11 NOTE — Evaluation (Signed)
Physical Therapy Evaluation Patient Details Name: [name]. MRN: [id number] DOB: Sep 25, 1955 Today's Date: 01/11/2021  History of Present Illness  Per MD Notes: Alec Less. is a 65 year old male with history of hypertension, congestive heart failure, hyperlipidemia, diabetes type 2, chronic ulcer on the left foot presented to the emergency department with complaints of fever, chills.  On presentation he was febrile with temperature of 104, he was tachycardic.  He was found to have worsening ulcer of the left foot.  Patient was hospitalized.  Seen by podiatry and underwent ray amputation.  He also developed atrial fibrillation with RVR during this hospital stay.  Clinical Impression  Patient resting in bed upon arrival to room; wife at bedside, supportive and encouraging.  Patient endorsing generalized fatigue, but agreeable to participation with session as tolerated.  L LE post-op dressing and boot donned throughout session; R LE post-op shoe donned for all [place] activities.  Generally weak and deconditioned throughout LEs; significant difficulty/inability maintaining NWB L LE with all mobility tasks. Currently requiring min assist for sit/stand from elevated bed surface (unable to complete from standard height); min/mod assist for single step forward/backward with RW (x3 trials).  Despite step by step cuing for sequence and technique, patient unable to maintain NWB with stepping trials; additional gait efforts deferred as result. Did trial scooting edge of bed to assess ability to successfully complete scoot/squat pivot transfers; able to generate lateral movement, but unable to fully clear buttocks.  Does fatigue quickly, and endorses generalized pain to R UE due to previous IV infiltration. Per notes, patient has successfully utilized knee scooter after previous surgery; may consider trial of this device in subsequent sessions to enhance ability to maintain NWB L LE. Would benefit  from skilled PT to address above deficits and promote optimal return to PLOF; recommend transition to STR upon discharge from acute hospitalization, as patient unable to demonstrate ability to safely negotiate home environment.        Recommendations for follow up therapy are one component of a multi-disciplinary discharge planning process, led by the attending physician.  Recommendations may be updated based on patient status, additional functional criteria and insurance authorization.  Follow Up Recommendations SNF    Equipment Recommendations  Wheelchair (measurements PT);Wheelchair cushion (measurements PT) (drop arm bariatric BSC)    Recommendations for Other Services       Precautions / Restrictions Precautions Precautions: Fall Restrictions Weight Bearing Restrictions: Yes LLE Weight Bearing: Non weight bearing      Mobility  Bed Mobility Overal bed mobility: Needs Assistance Bed Mobility: Supine to Sit;Sit to Supine     Supine to sit: Supervision Sit to supine: Supervision   General bed mobility comments: increased time/effort, havy use of bilat UEs requried to complete movement transition    Transfers Overall transfer level: Needs assistance Equipment used: Rolling walker (2 wheeled) Transfers: Sit to/from Stand Sit to Stand: Min assist         General transfer comment: unable to stand from standard bed height; requires bed surface elevated approx 5-6" to successfully complete sit/stand.  Educated in L LE positioning to otimize adherence to NWB with movement transition  Ambulation/Gait Ambulation/Gait assistance: Herbalist (Feet): 1 Feet Assistive device: Rolling walker (2 wheeled)       General Gait Details: 3-point, step to gait pattern requireinging step by step cuing/instruction for sequence and NWB L LE.  Unable to maintain NWB despite max cuing/assist; additional standing/gait efforts deferred as result.  Stairs  Wheelchair Mobility    Modified Rankin (Stroke Patients Only)       Balance Overall balance assessment: Needs assistance Sitting-balance support: No upper extremity supported;Feet supported Sitting balance-Leahy Scale: Normal     Standing balance support: Bilateral upper extremity supported Standing balance-Leahy Scale: Fair                               Pertinent Vitals/Pain Pain Assessment: Faces Pain Score: 4  Pain Location: LLE Pain Descriptors / Indicators: Discomfort;Grimacing;Guarding Pain Intervention(s): Limited activity within patient's tolerance;Monitored during session;Repositioned    Home Living Family/patient expects to be discharged to:: Private residence Living Arrangements: Spouse/significant other Available Help at Discharge: Family Type of Home: House Home Access: Level entry     Home Layout: Multi-level Home Equipment: Environmental consultant - 2 wheels;Tub bench;Cane - quad;Cane - single point Additional Comments: Bari RW; Futures trader; Investment banker, corporate.  Per wife, she has a hisory of back problems and can provide very limited physical assist.    Prior Function Level of Independence: Independent with assistive device(s)         Comments: Pt reports he had generally gotten back to baseline level of functional independence since his most recent foot sx ~10 months PTA. He endorses occasionally using his walking stick or straight cane for longer distance mobility.  Denies fall history.     Hand Dominance        Extremity/Trunk Assessment   Upper Extremity Assessment Upper Extremity Assessment: Overall WFL for tasks assessed    Lower Extremity Assessment Lower Extremity Assessment: Generalized weakness (R LE grossly at least 4/5 throughout; denies sensory deficit.  L LE grossly at least 4-/5 throughout; L ankle immobilized in boot throughout evaluation.)       Communication   Communication: No difficulties  Cognition Arousal/Alertness:  Awake/alert Behavior During Therapy: WFL for tasks assessed/performed Overall Cognitive Status: Within Functional Limits for tasks assessed                                        General Comments      Exercises Other Exercises Other Exercises: Educated in role of PT and progressive mobility, NWB restrictions to L LE And functional implications.  Discussed home set up/potential modifications, options for mobility and accessibility until [place] L LE advanced by surgeon. Other Exercises: Lateral scooting edge of bed (to assess ability for scoot/squat pivot transfer if needed), close sup.  Able to generate lateral movement, but unable to fully clear buttocks..  Does fatigue quikly and still requires constant cuing for NWB L LE (though ability improved in sitting versus standing).   Assessment/Plan    PT Assessment Patient needs continued PT services  PT Problem List Decreased strength;Decreased range of motion;Decreased activity tolerance;Decreased balance;Decreased mobility;Decreased coordination;Decreased knowledge of use of DME;Decreased safety awareness;Decreased knowledge of precautions;Obesity;Decreased skin integrity;Pain       PT Treatment Interventions DME instruction;Gait training;Stair training;Functional mobility training;Balance training;Therapeutic exercise;Therapeutic activities;Patient/family education    PT Goals (Current goals can be found in the Care Plan section)  Acute Rehab PT Goals Patient Stated Goal: To get back to working on my [place]. PT Goal Formulation: With patient/family Time For Goal Achievement: 01/25/21 Potential to Achieve Goals: Good    Frequency Min 2X/week   Barriers to discharge        Co-evaluation  AM-PAC PT "6 Clicks" Mobility  Outcome Measure Help needed turning from your back to your side while in a flat bed without using bedrails?: None Help needed moving from lying on your back to sitting on the  side of a flat bed without using bedrails?: None Help needed moving to and from a bed to a chair (including a wheelchair)?: A Little Help needed standing up from a chair using your arms (e.g., wheelchair or bedside chair)?: A Lot Help needed to walk in hospital room?: Total Help needed climbing 3-5 steps with a railing? : Total 6 Click Score: 15    End of Session Equipment Utilized During Treatment: Gait belt Activity Tolerance: Patient tolerated treatment well Patient left: in bed;with call bell/phone within reach;with bed alarm set Nurse Communication: Mobility status (Patient/wife asking about doppler to R LE; RN/MD informed/aware) PT Visit Diagnosis: Muscle weakness (generalized) (M62.81);Other abnormalities of gait and mobility (R26.89);Pain Pain - Right/Left: Left Pain - part of body: Ankle and joints of foot    Time: [id number] PT Time Calculation (min) (ACUTE ONLY): 44 min   Charges:   PT Evaluation $PT Eval Moderate Complexity: 1 Mod PT Treatments $Therapeutic Activity: 23-37 mins        Gwendolen Hewlett H. Owens Shark, PT, DPT, NCS 01/11/21, 10:05 PM [phone]

## 2021-01-11 NOTE — Progress Notes (Signed)
PODIATRY / FOOT AND ANKLE SURGERY PROGRESS NOTE  Requesting Physician: Dr. Reesa Chew  Reason for consult: Foot infection, wounds, sepsis   HPI: [name]. is a 65 y.o. male who presents today resting in bed comfortably status post 1 day left Achilles tendon lengthening, fifth metatarsal head resection, debridement of wound with attempted closure of wound.  Patient is kept his boot on dressing clean, dry, and intact since surgery.  Patient has not put weight on the foot since the procedure.  Patient has worked with physical therapy today but did have some difficulty staying off the left foot.  Patient states that he has some mild pain to the posterior aspect of the left heel at the area of the Achilles procedure site and at the area of the forefoot procedure.  Patient overall states that pain is fairly well controlled.  PMHx:  Past Medical History:  Diagnosis Date   Asthma    Coronary artery disease    Depression    Diabetes mellitus without complication ([place])    Gout    History anabolic steroid use    Hyperlipidemia    Hypertension    Hypogonadism in male    Morbid obesity ([place])    Myocardial infarction ([place])    Peripheral vascular disease ([place])    Perirectal abscess    Pleurisy    Sleep apnea    CPAP at night, no oxygen   Varicella     Surgical Hx:  Past Surgical History:  Procedure Laterality Date   ABDOMINAL AORTIC ANEURYSM REPAIR     AMPUTATION TOE Right 02/10/2016   Procedure: AMPUTATION TOE 3RD TOE;  Surgeon: Samara Deist, DPM;  Location: [facility];  Service: Podiatry;  Laterality: Right;   AMPUTATION TOE Left 02/24/2020   Procedure: AMPUTATION TOE;  Surgeon: Caroline More, DPM;  Location: [facility];  Service: Podiatry;  Laterality: Left;   APPLICATION OF WOUND VAC Left 02/29/2020   Procedure: APPLICATION OF WOUND VAC;  Surgeon: Caroline More, DPM;  Location: [facility];  Service: Podiatry;  Laterality: Left;   COLONOSCOPY WITH PROPOFOL [name] 11/18/2015   Procedure:  COLONOSCOPY WITH PROPOFOL;  Surgeon: Manya Silvas, MD;  Location: [facility];  Service: Endoscopy;  Laterality: [name];   CORONARY ARTERY BYPASS GRAFT     CORONARY STENT INTERVENTION [name] 02/02/2020   Procedure: CORONARY STENT INTERVENTION;  Surgeon: Isaias Cowman, MD;  Location: [place] [facility];  Service: Cardiovascular;  Laterality: [name];   IRRIGATION AND DEBRIDEMENT FOOT Left 02/29/2020   Procedure: IRRIGATION AND DEBRIDEMENT FOOT;  Surgeon: Caroline More, DPM;  Location: [facility];  Service: Podiatry;  Laterality: Left;   IRRIGATION AND DEBRIDEMENT FOOT Left 02/24/2020   Procedure: IRRIGATION AND DEBRIDEMENT FOOT;  Surgeon: Caroline More, DPM;  Location: [facility];  Service: Podiatry;  Laterality: Left;   KNEE ARTHROSCOPY     LEFT HEART CATH AND CORS/GRAFTS ANGIOGRAPHY [name] 02/02/2020   Procedure: LEFT HEART CATH AND CORS/GRAFTS ANGIOGRAPHY;  Surgeon: Teodoro Spray, MD;  Location: [place] [facility];  Service: Cardiovascular;  Laterality: [name];   LOWER EXTREMITY ANGIOGRAPHY Left 02/25/2020   Procedure: Lower Extremity Angiography;  Surgeon: Algernon Huxley, MD;  Location: [place] [facility];  Service: Cardiovascular;  Laterality: Left;   LOWER EXTREMITY ANGIOGRAPHY Left 01/04/2021   Procedure: LOWER EXTREMITY ANGIOGRAPHY;  Surgeon: Algernon Huxley, MD;  Location: [place] [facility];  Service: Cardiovascular;  Laterality: Left;   PERIPHERAL VASCULAR CATHETERIZATION Right 01/24/2016   Procedure: Lower Extremity Angiography;  Surgeon:  Katha Cabal, MD;  Location: [place] [facility];  Service: Cardiovascular;  Laterality: Right;   PERIPHERAL VASCULAR CATHETERIZATION Right 01/25/2016   Procedure: Lower Extremity Angiography;  Surgeon: Katha Cabal, MD;  Location: [place] [facility];  Service: Cardiovascular;  Laterality: Right;   TOE AMPUTATION     TONSILLECTOMY      FHx: History reviewed. No pertinent family history.  Social History:  reports that he quit smoking about 5  years ago. His smoking use included cigarettes. He has a 22.50 pack-year smoking history. He has never used smokeless tobacco. He reports that he does not currently use alcohol after a past usage of about 3.0 standard drinks per week. He reports that he does not use drugs.  Allergies:  Allergies  Allergen Reactions   Statins     Other reaction(s): Muscle Pain Causes legs to ache per pt    Medications Prior to Admission  Medication Sig Dispense Refill   insulin lispro (HUMALOG) 100 UNIT/ML injection Inject 15-28 Units into the skin 3 (three) times daily with meals.     metoprolol (LOPRESSOR) 50 MG tablet Take 50 mg by mouth 2 (two) times daily.     primidone (MYSOLINE) 250 MG tablet Take 250 mg by mouth 2 (two) times daily.     tamsulosin (FLOMAX) 0.4 MG CAPS capsule Take 0.4 mg by mouth daily.     vitamin B-12 (CYANOCOBALAMIN) 1000 MCG tablet Take 10,000 mcg by mouth daily. Energy shot     zinc gluconate 50 MG tablet Take 50 mg by mouth daily.     apixaban (ELIQUIS) 5 MG TABS tablet Take 1 tablet (5 mg total) by mouth 2 (two) times daily. 60 tablet 5   aspirin EC 81 MG tablet Take 1 tablet (81 mg total) by mouth daily. Swallow whole. 150 tablet 2   atorvastatin (LIPITOR) 40 MG tablet Take 1 tablet (40 mg total) by mouth daily. 30 tablet 0   clopidogrel (PLAVIX) 75 MG tablet Take 1 tablet (75 mg total) by mouth daily with breakfast. 30 tablet 0   cyclobenzaprine (FLEXERIL) 5 MG tablet Take 1 tablet (5 mg total) by mouth 3 (three) times daily as needed for muscle spasms. (Patient not taking: No sig reported) 12 tablet 0   ezetimibe (ZETIA) 10 MG tablet Take 10 mg by mouth daily.     ferrous sulfate 325 (65 FE) MG tablet Take 325 mg by mouth daily with breakfast.     furosemide (LASIX) 20 MG tablet Take 20 mg by mouth daily as needed for fluid or edema.     gabapentin (NEURONTIN) 300 MG capsule Take 300 mg by mouth daily.     insulin aspart (NOVOLOG) 100 UNIT/ML injection Inject 10 Units into  the skin 3 (three) times daily with meals. (Patient not taking: No sig reported) 10 mL 11   isosorbide mononitrate (IMDUR) 60 MG 24 hr tablet Take 60 mg by mouth 2 (two) times daily.     Lactobacillus (ACIDOPHILUS) CAPS capsule Take 1 capsule by mouth daily. (Patient not taking: Reported on 01/05/2021)     LANTUS SOLOSTAR 100 UNIT/ML Solostar Pen Inject 40 Units into the skin 2 (two) times daily. (Patient taking differently: Inject 25 Units into the skin 2 (two) times daily.) 15 mL 11   losartan (COZAAR) 100 MG tablet Take 100 mg by mouth daily.     MEDIUM CHAIN TRIGLYCERIDES PO Take 1,000 mg by mouth daily.     metFORMIN (GLUCOPHAGE) 1000 MG tablet Take  1,000 mg by mouth 2 (two) times daily.      Multiple Vitamins-Minerals (MULTIVITAMIN WITH MINERALS) tablet Take 1 tablet by mouth daily.     nitroGLYCERIN (NITROSTAT) 0.4 MG SL tablet Place 0.4 mg under the tongue daily as needed.     oxyCODONE-acetaminophen (PERCOCET) 5-325 MG tablet Take 1 tablet by mouth every 6 (six) hours as needed for severe pain. (Patient not taking: No sig reported) 12 tablet 0   OZEMPIC, 0.25 OR 0.5 MG/DOSE, 2 MG/1.5ML SOPN Inject into the skin.     pramipexole (MIRAPEX) 1 MG tablet Take 2 mg by mouth at bedtime.      Physical Exam: General: Alert and oriented.  No apparent distress.  Vascular: Difficult to palpate DP and PT pulses bilateral due to swelling present.  Mild to moderate bilateral lower extremity nonpitting edema.  Neuro: Light touch sensation nearly absent to bilateral lower extremities.  Derm: Incision line to the posterior lower leg x3 appears to be intact with skin staples intact, no erythema, no edema, no drainage, no signs of infection present.  Incision site to the dorsal lateral aspect of the fifth metatarsal phalangeal joint appears to be well coapted with sutures intact with mild associated edema, minimal erythema, no drainage, no signs of infection present.  Plantar wound area to the left plantar  fifth metatarsal phalangeal joint appears to be well coapted with sutures intact today as well with minimal serous drainage, no odor, no erythema, mild edema.      MSK: Partial third ray amputation bilateral.  Limited ankle joint dorsiflexion noted with knee extended which is minimally improved knee flexion bilateral.  Left fifth metatarsal head resection  Results for orders placed or performed during the hospital encounter of 01/03/21 (from the past 48 hour(s))  Glucose, capillary     Status: Abnormal   Collection Time: 01/09/21  4:25 PM  Result Value Ref Range   Glucose-Capillary 146 (H) 70 - 99 mg/dL    Comment: Glucose reference range applies only to samples taken after fasting for at least 8 hours.  Glucose, capillary     Status: Abnormal   Collection Time: 01/09/21 10:21 PM  Result Value Ref Range   Glucose-Capillary 295 (H) 70 - 99 mg/dL    Comment: Glucose reference range applies only to samples taken after fasting for at least 8 hours.  Glucose, capillary     Status: Abnormal   Collection Time: 01/10/21  9:27 AM  Result Value Ref Range   Glucose-Capillary 380 (H) 70 - 99 mg/dL    Comment: Glucose reference range applies only to samples taken after fasting for at least 8 hours.  Glucose, capillary     Status: Abnormal   Collection Time: 01/10/21 12:20 PM  Result Value Ref Range   Glucose-Capillary 317 (H) 70 - 99 mg/dL    Comment: Glucose reference range applies only to samples taken after fasting for at least 8 hours.  Glucose, capillary     Status: Abnormal   Collection Time: 01/10/21  4:11 PM  Result Value Ref Range   Glucose-Capillary 162 (H) 70 - 99 mg/dL    Comment: Glucose reference range applies only to samples taken after fasting for at least 8 hours.  Glucose, capillary     Status: Abnormal   Collection Time: 01/10/21  5:54 PM  Result Value Ref Range   Glucose-Capillary 149 (H) 70 - 99 mg/dL    Comment: Glucose reference range applies only to samples taken  after fasting for at  least 8 hours.  Glucose, capillary     Status: Abnormal   Collection Time: 01/10/21  9:17 PM  Result Value Ref Range   Glucose-Capillary 234 (H) 70 - 99 mg/dL    Comment: Glucose reference range applies only to samples taken after fasting for at least 8 hours.  Glucose, capillary     Status: Abnormal   Collection Time: 01/11/21  7:55 AM  Result Value Ref Range   Glucose-Capillary 248 (H) 70 - 99 mg/dL    Comment: Glucose reference range applies only to samples taken after fasting for at least 8 hours.  Digoxin level     Status: Abnormal   Collection Time: 01/11/21  8:28 AM  Result Value Ref Range   Digoxin Level 0.6 (L) 0.8 - 2.0 ng/mL    Comment: Performed at [facility], [address] [place], [place] [address]  Glucose, capillary     Status: Abnormal   Collection Time: 01/11/21 11:50 AM  Result Value Ref Range   Glucose-Capillary 312 (H) 70 - 99 mg/dL    Comment: Glucose reference range applies only to samples taken after fasting for at least 8 hours.   DG Foot Complete Left  Result Date: 01/10/2021 CLINICAL DATA:  Postop EXAM: LEFT FOOT - COMPLETE 3+ VIEW COMPARISON:  MRI left foot 01/04/2021, left foot 01/03/2021 FINDINGS: Interval proximal fifth digit metatarsal amputation with clean margins. Redemonstration of prior transmetatarsal third digit amputation with clear margins. No cortical erosion or destruction. There is no evidence of fracture or dislocation. There is no evidence of arthropathy or other focal bone abnormality. Subcutaneus soft tissue edema and emphysema overlying the fifth digit likely postsurgical. Subcutaneus soft tissue emphysema overlying the distal posterior ankle with associated skin staples. IMPRESSION: 1. Interval proximal fifth digit metatarsal amputation with clean margins. 2. Prior transmetatarsal third digit amputation with clear margins. 3. Associated subcutaneus soft tissue edema emphysema likely postsurgical.  Electronically Signed   By: Iven Finn M.D.   On: 01/10/2021 19:47    Blood pressure 117/84, pulse 100, temperature 98.8 F (37.1 C), resp. rate 16, height '6\' 4"'$  (1.93 m), weight (!) 162.8 kg, SpO2 93 %.  Assessment Sepsis secondary to cellulitis left foot secondary to chronic open ulceration left fifth metatarsal phalangeal joint plantar status post left fifth metatarsal head resection with wound debridement and closure and tendo Achilles lengthening Diabetes type 2 polyneuropathy PVD Noncompliance with medical treatment  Plan -Patient seen and examined. -Postoperative x-ray imaging reviewed, appears to be within normal limits overall. -Pathology specimen still pending from surgical intervention.  Previous wound culture grew MSSA.  Previous blood culture that was positive upon admission grew strep G bacteria and Propionibacterium Acnes.  Defer to infectious disease for antibiotic therapy upon discharge.  From podiatry perspective believe that all infection was removed with amputation and debridement of wound.  Could send home on oral antibiotics but once again defer to infectious disease if IV antibiotics are needed due to previous bacteremia. -Skin edges to the incision sites appear to be well coapted with sutures intact with no signs of infection today. -Applied Xeroform to the incision lines followed by 4 x 4 gauze, ABD, Kerlix, Ace wrap from toes to below the knee.  Patient is to keep this dressing clean, dry, and intact for the next week and then will have it changed in the office or if he still in the hospital can change in the hospital. -Appreciate PT/OT recommendations.  Patient may require skilled nursing facility/rehab due to  need to be nonweightbearing for approximately 4 to 6 weeks due to tendo Achilles lengthening and ulcer present to left foot.  Discussed and stressed importance of compliance with patient in detail.  Podiatry team to follow more peripherally at this point.   Once medically stable from podiatry perspective patient can be discharged with follow-up in clinic within 1 week of surgical date.  Caroline More, DPM 01/11/2021, 12:28 PM

## 2021-01-11 NOTE — Progress Notes (Signed)
[facility] PROGRESS NOTE   Alec A Wolpert Jr. JR:[id number] DOB: 01-08-1956 DOA: 01/03/2021  PCP: [name], MD  Brief History/Interval Summary: 65 year old male with history of hypertension, congestive heart failure, hyperlipidemia, diabetes type 2, chronic ulcer on the left foot presented to the emergency department with complaints of fever, chills.  On presentation he was febrile with temperature of 104, he was tachycardic.  He was found to have worsening ulcer of the left foot.  Patient was hospitalized.  Seen by podiatry and underwent ray amputation.  He also developed atrial fibrillation with RVR during this hospital stay.  Consultants: Podiatry.  Cardiology  Procedures: Ray amputation left fifth metatarsal.  Tendo Achilles lengthening on the left.  Wound debridement left plantar fifth metatarsal phalangeal joint  Antibiotics: Anti-infectives (From admission, onward)    Start     Dose/Rate Route Frequency Ordered Stop   01/06/21 1230  Ampicillin-Sulbactam (UNASYN) 3 g in sodium chloride 0.9 % 100 mL IVPB        3 g 200 mL/hr over 30 Minutes Intravenous Every 6 hours 01/06/21 1130     01/05/21 0600  ceFAZolin (ANCEF) IVPB 2g/100 mL premix        2 g 200 mL/hr over 30 Minutes Intravenous On call to O.R. 01/04/21 1711 01/05/21 0529   01/04/21 1400  cefTRIAXone (ROCEPHIN) 2 g in sodium chloride 0.9 % 100 mL IVPB  Status:  Discontinued        2 g 200 mL/hr over 30 Minutes Intravenous Every 24 hours 01/04/21 0853 01/06/21 1130   01/04/21 1000  vancomycin (VANCOREADY) IVPB 1750 mg/350 mL  Status:  Discontinued        1,750 mg 175 mL/hr over 120 Minutes Intravenous Every 12 hours 01/03/21 2154 01/04/21 0757   01/04/21 1000  vancomycin (VANCOREADY) IVPB 1500 mg/300 mL  Status:  Discontinued        1,500 mg 150 mL/hr over 120 Minutes Intravenous Every 12 hours 01/04/21 0757 01/04/21 0853   01/03/21 2200  piperacillin-tazobactam (ZOSYN) IVPB 3.375 g  Status:   Discontinued        3.375 g 12.5 mL/hr over 240 Minutes Intravenous Every 8 hours 01/03/21 2154 01/04/21 0853   01/03/21 2100  vancomycin (VANCOREADY) IVPB 500 mg/100 mL        500 mg 100 mL/hr over 60 Minutes Intravenous  Once 01/03/21 1848 01/04/21 0019   01/03/21 1900  ceFEPIme (MAXIPIME) 2 g in sodium chloride 0.9 % 100 mL IVPB        2 g 200 mL/hr over 30 Minutes Intravenous  Once 01/03/21 1845 01/03/21 1925   01/03/21 1845  metroNIDAZOLE (FLAGYL) IVPB 500 mg  Status:  Discontinued        500 mg 100 mL/hr over 60 Minutes Intravenous Every 8 hours 01/03/21 1841 01/04/21 0853   01/03/21 1845  vancomycin (VANCOREADY) IVPB 2000 mg/400 mL        2,000 mg 200 mL/hr over 120 Minutes Intravenous  Once 01/03/21 1845 01/03/21 2248       Subjective/Interval History: Patient denies any significant pain in the left lower extremity.  No chest pain shortness of breath.  No nausea vomiting.  His wife is at the bedside   Assessment/Plan:  Left foot cellulitis with chronic ulcer/streptococcal bacteremia Presented with worsening left foot ulcer.  MRI did not show any signs of osteomyelitis but showed extensive cellulitis and myositis.  Blood cultures positive for group B streptococcus.  Infectious disease was consulted.  Patient currently  on Unasyn. Patient underwent amputation of the affected area on 9/13.  He is nonweightbearing on the left lower extremity. Antibiotic duration to be determined by infectious disease. Transthoracic echocardiogram did not suggest any vegetation. Patient was also seen by vascular surgery during this hospitalization and underwent left lower extremity angiogram.  No significant stenosis was noted.  Atrial fibrillation with RVR This is apparently a new diagnosis for him.  He was on apixaban prior to admission but was on it for peripheral artery disease.  Cardiology has been following him here in the hospital.  Was on a amiodarone infusion which has been changed over to  oral.  Has been on diltiazem infusion as well.  Eliquis was on hold for surgery.  Will be resumed this evening. Patient also noted to be on digoxin and metoprolol. Further management per cardiology.  Cardioversion is being considered.  Diabetes mellitus type 2, uncontrolled with hyperglycemia Noted to be on SSI and glargine.  CBGs poorly controlled.  HbA1c 8.9.  Could consider further increase in dose of insulin.  Prior to admission patient was on metformin Ozempic Lantus NovoLog.  Coronary artery disease Stable.  Continue home medications.  Essential hypertension Monitor blood pressures closely.  History of BPH Continue Flomax  Morbid obesity Estimated body mass index is 43.69 kg/m as calculated from the following:   Height as of this encounter: '6\' 4"'$  (1.93 m).   Weight as of this encounter: 162.8 kg.    DVT Prophylaxis: Apixaban to be resumed tonight Code Status: Full code Family Communication: Discussed with his wife Disposition Plan: PT and OT is pending.  May need to go to SNF for rehab  Status is: Inpatient  Remains inpatient appropriate because:IV treatments appropriate due to intensity of illness or inability to take PO and Inpatient level of care appropriate due to severity of illness  Dispo: The patient is from: Home              Anticipated d/c is to:  To be determined              Patient currently is not medically stable to d/c.   Difficult to place patient No      Medications: Scheduled:  amiodarone  400 mg Oral Daily   apixaban  5 mg Oral BID   atorvastatin  20 mg Oral QHS   clopidogrel  75 mg Oral Daily   digoxin  0.125 mg Intravenous Daily   ezetimibe  10 mg Oral QHS   insulin aspart  0-15 Units Subcutaneous TID WC   insulin aspart  0-5 Units Subcutaneous QHS   insulin aspart  7 Units Subcutaneous TID WC   insulin glargine-yfgn  40 Units Subcutaneous BID   metoprolol tartrate  50 mg Oral BID   pramipexole  2 mg Oral QHS   primidone  250 mg Oral  BID   tamsulosin  0.4 mg Oral Daily   Continuous:  ampicillin-sulbactam (UNASYN) IV 3 g (01/11/21 1222)   diltiazem (CARDIZEM) infusion 15 mg/hr (01/11/21 1041)   [id number] **OR** acetaminophen, metoprolol tartrate, ondansetron **OR** ondansetron (ZOFRAN) IV   Objective:  Vital Signs  Vitals:   01/10/21 2335 01/11/21 0557 01/11/21 0754 01/11/21 1152  BP: 133/82 (!) 136/91 117/78 117/84  Pulse: (!) 53 77 87 100  Resp: '15 15 18 16  '$ Temp: 98.9 F (37.2 C) 98.6 F (37 C) 97.7 F (36.5 C) 98.8 F (37.1 C)  TempSrc: Oral     SpO2: 94% 95% 96% 93%  Weight:  Height:        Intake/Output Summary (Last 24 hours) at 01/11/2021 1258 Last data filed at 01/11/2021 1152 Gross per 24 hour  Intake 1060 ml  Output 1203 ml  Net -143 ml   Filed Weights   01/08/21 0541 01/09/21 0547 01/10/21 1611  Weight: (!) 162.5 kg (!) 162.8 kg (!) 162.8 kg    General appearance: Awake alert.  In no distress Resp: Clear to auscultation bilaterally.  Normal effort Cardio: S1-S2 is irregularly irregular.  No S3-S4.  No rubs or bruit. GI: Abdomen is soft.  Nontender nondistended.  Bowel sounds are present normal.  No masses organomegaly Extremities: No edema.  Left lower extremity is in dressing Neurologic: Alert and oriented x3.  No focal neurological deficits.    Lab Results:  Data Reviewed: I have personally reviewed following labs and imaging studies  CBC: Recent Labs  Lab 01/05/21 0726 01/08/21 0533  WBC 7.3 7.7  NEUTROABS 5.6  --   HGB 14.2 13.5  HCT 43.8 40.2  MCV 93.4 92.8  PLT 143* 0000000    Basic Metabolic Panel: Recent Labs  Lab 01/05/21 0726 01/08/21 0533  NA 135 136  K 4.1 4.0  CL 100 103  CO2 27 26  GLUCOSE 325* 289*  BUN 19 27*  CREATININE 0.97 0.95  CALCIUM 8.1* 7.6*  MG 2.0  --     GFR: Estimated Creatinine Clearance: 128.5 mL/min (by C-G formula based on SCr of 0.95 mg/dL).  Liver Function Tests: Recent Labs  Lab 01/05/21 0726  AST 13*   ALT 13  ALKPHOS 78  BILITOT 0.5  PROT 6.4*  ALBUMIN 2.8*     Coagulation Profile: Recent Labs  Lab 01/05/21 0726  INR 1.2     CBG: Recent Labs  Lab 01/10/21 1611 01/10/21 1754 01/10/21 2117 01/11/21 0755 01/11/21 1150  GLUCAP 162* 149* 234* 248* 312*     Recent Results (from the past 240 hour(s))  Blood culture (routine single)     Status: Abnormal   Collection Time: 01/03/21  5:37 PM   Specimen: BLOOD  Result Value Ref Range Status   Specimen Description   Final    BLOOD BLOOD RIGHT FOREARM Performed at [facility], [address] [place], [place] [address]    Special Requests   Final    BOTTLES DRAWN AEROBIC AND ANAEROBIC Blood Culture adequate volume Performed at [facility], [address] [place], [place] [address]    Culture  Setup Time   Final    GRAM POSITIVE COCCI AEROBIC BOTTLE ONLY CRITICAL RESULT CALLED TO, READ BACK BY AND VERIFIED WITH: SAMANTHA RAUER 01/04/21 0749 KLW GRAM POSITIVE RODS ANAEROBIC BOTTLE ONLY CRITICAL RESULT CALLED TO, READ BACK BY AND VERIFIED WITH: BRANDON BEERS 01/08/21 @ 1110 BY SB Performed at [facility], [address] [place], [place] [address]    Culture (A)  Final    STREPTOCOCCUS GROUP G PROPIONIBACTERIUM ACNES Standardized susceptibility testing for this organism is not available. Performed at [facility], [place] [address], [place], [place] [address]    Report Status 01/10/2021 FINAL  Final   Organism ID, Bacteria STREPTOCOCCUS GROUP G  Final      Susceptibility   Streptococcus group g - MIC*    CLINDAMYCIN <=0.25 SENSITIVE Sensitive     AMPICILLIN <=0.25 SENSITIVE Sensitive     ERYTHROMYCIN <=0.12 SENSITIVE Sensitive     VANCOMYCIN 0.5 SENSITIVE Sensitive     CEFTRIAXONE <=0.12 SENSITIVE Sensitive  LEVOFLOXACIN 0.5 SENSITIVE Sensitive     PENICILLIN Value in next row Sensitive      SENSITIVEMIC =0.06S    * STREPTOCOCCUS GROUP G  Blood Culture ID Panel (Reflexed)      Status: Abnormal   Collection Time: 01/03/21  5:37 PM  Result Value Ref Range Status   Enterococcus faecalis NOT DETECTED NOT DETECTED Final   Enterococcus Faecium NOT DETECTED NOT DETECTED Final   Listeria monocytogenes NOT DETECTED NOT DETECTED Final   Staphylococcus species NOT DETECTED NOT DETECTED Final   Staphylococcus aureus (BCID) NOT DETECTED NOT DETECTED Final   Staphylococcus epidermidis NOT DETECTED NOT DETECTED Final   Staphylococcus lugdunensis NOT DETECTED NOT DETECTED Final   Streptococcus species DETECTED (A) NOT DETECTED Final    Comment: Not Enterococcus species, Streptococcus agalactiae, Streptococcus pyogenes, or Streptococcus pneumoniae. CRITICAL RESULT CALLED TO, READ BACK BY AND VERIFIED WITH: SAMANTHA RAUER 01/04/21 0749 KLW    Streptococcus agalactiae NOT DETECTED NOT DETECTED Final   Streptococcus pneumoniae NOT DETECTED NOT DETECTED Final   Streptococcus pyogenes NOT DETECTED NOT DETECTED Final   A.calcoaceticus-baumannii NOT DETECTED NOT DETECTED Final   Bacteroides fragilis NOT DETECTED NOT DETECTED Final   Enterobacterales NOT DETECTED NOT DETECTED Final   Enterobacter cloacae complex NOT DETECTED NOT DETECTED Final   Escherichia coli NOT DETECTED NOT DETECTED Final   Klebsiella aerogenes NOT DETECTED NOT DETECTED Final   Klebsiella oxytoca NOT DETECTED NOT DETECTED Final   Klebsiella pneumoniae NOT DETECTED NOT DETECTED Final   Proteus species NOT DETECTED NOT DETECTED Final   Salmonella species NOT DETECTED NOT DETECTED Final   Serratia marcescens NOT DETECTED NOT DETECTED Final   Haemophilus influenzae NOT DETECTED NOT DETECTED Final   Neisseria meningitidis NOT DETECTED NOT DETECTED Final   Pseudomonas aeruginosa NOT DETECTED NOT DETECTED Final   Stenotrophomonas maltophilia NOT DETECTED NOT DETECTED Final   Candida albicans NOT DETECTED NOT DETECTED Final   Candida auris NOT DETECTED NOT DETECTED Final   Candida glabrata NOT DETECTED NOT  DETECTED Final   Candida krusei NOT DETECTED NOT DETECTED Final   Candida parapsilosis NOT DETECTED NOT DETECTED Final   Candida tropicalis NOT DETECTED NOT DETECTED Final   Cryptococcus neoformans/gattii NOT DETECTED NOT DETECTED Final    Comment: Performed at [facility], [address] [place], [place] [address]  Resp Panel by RT-PCR (Flu A&B, Covid) Nasopharyngeal Swab     Status: None   Collection Time: 01/03/21  6:24 PM   Specimen: Nasopharyngeal Swab; Nasopharyngeal(NP) swabs in vial transport medium  Result Value Ref Range Status   SARS Coronavirus 2 by RT PCR NEGATIVE NEGATIVE Final    Comment: (NOTE) SARS-CoV-2 target nucleic acids are NOT DETECTED.  The SARS-CoV-2 RNA is generally detectable in upper respiratory specimens during the acute phase of infection. The lowest concentration of SARS-CoV-2 viral copies this assay can detect is 138 copies/mL. A negative result does not preclude SARS-Cov-2 infection and should not be used as the sole basis for treatment or other patient management decisions. A negative result may occur with  improper specimen collection/handling, submission of specimen other than nasopharyngeal swab, presence of viral mutation(s) within the areas targeted by this assay, and inadequate number of viral copies(<138 copies/mL). A negative result must be combined with clinical observations, patient history, and epidemiological information. The expected result is Negative.  Fact Sheet for Patients:  EntrepreneurPulse.com.au  Fact Sheet for Healthcare Providers:  IncredibleEmployment.be  This test is no t yet approved or cleared by the [place]  States FDA and  has been authorized for detection and/or diagnosis of SARS-CoV-2 by FDA under an Emergency Use Authorization (EUA). This EUA will remain  in effect (meaning this test can be used) for the duration of the COVID-19 declaration under Section 564(b)(1) of  the Act, 21 U.S.C.section 360bbb-3(b)(1), unless the authorization is terminated  or revoked sooner.       Influenza A by PCR NEGATIVE NEGATIVE Final   Influenza B by PCR NEGATIVE NEGATIVE Final    Comment: (NOTE) The Xpert Xpress SARS-CoV-2/FLU/RSV plus assay is intended as an aid in the diagnosis of influenza from Nasopharyngeal swab specimens and should not be used as a sole basis for treatment. Nasal washings and aspirates are unacceptable for Xpert Xpress SARS-CoV-2/FLU/RSV testing.  Fact Sheet for Patients: EntrepreneurPulse.com.au  Fact Sheet for Healthcare Providers: IncredibleEmployment.be  This test is not yet approved or cleared by the [place] FDA and has been authorized for detection and/or diagnosis of SARS-CoV-2 by FDA under an Emergency Use Authorization (EUA). This EUA will remain in effect (meaning this test can be used) for the duration of the COVID-19 declaration under Section 564(b)(1) of the Act, 21 U.S.C. section 360bbb-3(b)(1), unless the authorization is terminated or revoked.  Performed at [facility], [address] [place], [place] [address]   Blood culture (single)     Status: Abnormal   Collection Time: 01/03/21  7:15 PM   Specimen: BLOOD  Result Value Ref Range Status   Specimen Description   Final    BLOOD BLOOD LEFT HAND Performed at [facility], [address] [place], [place] [address]    Special Requests   Final    BOTTLES DRAWN AEROBIC AND ANAEROBIC Blood Culture results may not be optimal due to an excessive volume of blood received in culture bottles Performed at [facility], [address] [place], [place] [address]    Culture  Setup Time   Final    GRAM POSITIVE COCCI ANAEROBIC BOTTLE ONLY CRITICAL RESULT CALLED TO, READ BACK BY AND VERIFIED WITH: Coralie Carpen 01/04/21 [place] Performed at [facility], [address] [place], [place]  [address]    Culture (A)  Final    STREPTOCOCCUS GROUP G SUSCEPTIBILITIES PERFORMED ON PREVIOUS CULTURE WITHIN THE LAST 5 DAYS. Performed at [facility], [place] [address], [place], [place] [address]    Report Status 01/06/2021 FINAL  Final  MRSA Next Gen by PCR, Nasal     Status: None   Collection Time: 01/04/21  7:52 AM   Specimen: Nasal Mucosa; Nasal Swab  Result Value Ref Range Status   MRSA by PCR Next Gen NOT DETECTED NOT DETECTED Final    Comment: (NOTE) The GeneXpert MRSA Assay (FDA approved for NASAL specimens only), is one component of a comprehensive MRSA colonization surveillance program. It is not intended to diagnose MRSA infection nor to guide or monitor treatment for MRSA infections. Test performance is not FDA approved in patients less than 73 years old. Performed at [facility], [address] [place], [place] [address]   Aerobic/Anaerobic Culture w Gram Stain (surgical/deep wound)     Status: None   Collection Time: 01/04/21 12:39 PM   Specimen: Foot; Wound  Result Value Ref Range Status   Specimen Description   Final    FOOT LEFT Performed at [facility], [address], [place], [place] [address]    Special Requests   Final    NONE Performed at  [address] [place], [place] [address]    Gram Stain   Final    FEW SQUAMOUS EPITHELIAL CELLS PRESENT FEW WBC SEEN MODERATE GRAM POSITIVE COCCI FEW GRAM POSITIVE RODS    Culture   Final    RARE STAPHYLOCOCCUS AUREUS WITHIN MIXED ORGANISMS NO ANAEROBES ISOLATED Performed at [facility], [place] [address], [place], [place] [address]    Report Status 01/10/2021 FINAL  Final   Organism ID, Bacteria STAPHYLOCOCCUS AUREUS  Final      Susceptibility   Staphylococcus aureus - MIC*    CIPROFLOXACIN <=0.5 SENSITIVE Sensitive     ERYTHROMYCIN RESISTANT Resistant     GENTAMICIN <=0.5 SENSITIVE Sensitive     OXACILLIN 0.5 SENSITIVE Sensitive     TETRACYCLINE  <=1 SENSITIVE Sensitive     VANCOMYCIN <=0.5 SENSITIVE Sensitive     TRIMETH/SULFA 80 RESISTANT Resistant     CLINDAMYCIN RESISTANT Resistant     RIFAMPIN <=0.5 SENSITIVE Sensitive     Inducible Clindamycin POSITIVE Resistant     * RARE STAPHYLOCOCCUS AUREUS  CULTURE, BLOOD (ROUTINE X 2) w Reflex to ID Panel     Status: None   Collection Time: 01/06/21 12:23 AM   Specimen: BLOOD  Result Value Ref Range Status   Specimen Description BLOOD RIGHT ASSIST CONTROL  Final   Special Requests   Final    BOTTLES DRAWN AEROBIC AND ANAEROBIC Blood Culture adequate volume   Culture   Final    NO GROWTH 5 DAYS Performed at [facility], [address] [place], [place] [address]    Report Status 01/11/2021 FINAL  Final  CULTURE, BLOOD (ROUTINE X 2) w Reflex to ID Panel     Status: None   Collection Time: 01/06/21 12:25 AM   Specimen: BLOOD  Result Value Ref Range Status   Specimen Description BLOOD LEFT ASSIST CONTROL  Final   Special Requests   Final    BOTTLES DRAWN AEROBIC ONLY Blood Culture adequate volume   Culture   Final    NO GROWTH 5 DAYS Performed at [facility], [address], [place], [place] [address]    Report Status 01/11/2021 FINAL  Final      Radiology Studies: DG Foot Complete Left  Result Date: 01/10/2021 CLINICAL DATA:  Postop EXAM: LEFT FOOT - COMPLETE 3+ VIEW COMPARISON:  MRI left foot 01/04/2021, left foot 01/03/2021 FINDINGS: Interval proximal fifth digit metatarsal amputation with clean margins. Redemonstration of prior transmetatarsal third digit amputation with clear margins. No cortical erosion or destruction. There is no evidence of fracture or dislocation. There is no evidence of arthropathy or other focal bone abnormality. Subcutaneus soft tissue edema and emphysema overlying the fifth digit likely postsurgical. Subcutaneus soft tissue emphysema overlying the distal posterior ankle with associated skin staples. IMPRESSION: 1. Interval  proximal fifth digit metatarsal amputation with clean margins. 2. Prior transmetatarsal third digit amputation with clear margins. 3. Associated subcutaneus soft tissue edema emphysema likely postsurgical. Electronically Signed   By: Iven Finn M.D.   On: 01/10/2021 19:47       LOS: 8 days   [facility] Pager on [email or web address]  01/11/2021, 12:58 PM

## 2021-01-11 NOTE — Evaluation (Signed)
Occupational Therapy Evaluation Patient Details Name: [name]. MRN: [id number] DOB: 1956-04-13 Today's Date: 01/11/2021   History of Present Illness Per MD Notes: Alec Less. is a 65 year old male with history of hypertension, congestive heart failure, hyperlipidemia, diabetes type 2, chronic ulcer on the left foot presented to the emergency department with complaints of fever, chills.  On presentation he was febrile with temperature of 104, he was tachycardic.  He was found to have worsening ulcer of the left foot.  Patient was hospitalized.  Seen by podiatry and underwent ray amputation.  He also developed atrial fibrillation with RVR during this hospital stay.   Clinical Impression   [name] was seen for OT evaluation this date. Prior to hospital admission, pt was generally independent with ADL management and functional mobility. Pt lives with his spouse in a multi-level home with a level entrance at the side. Currently pt demonstrates impairments as described below (See OT problem list) which functionally limit his ability to perform ADL/self-care tasks. Pt currently requires MAX A for functional mobility and LB ADL management.  Pt would benefit from skilled OT services to address noted impairments and functional limitations (see below for any additional details) in order to maximize safety and independence while minimizing falls risk and caregiver burden. Upon hospital discharge, recommend STR to maximize pt safety and return to PLOF.       Recommendations for follow up therapy are one component of a multi-disciplinary discharge planning process, led by the attending physician.  Recommendations may be updated based on patient status, additional functional criteria and insurance authorization.   Follow Up Recommendations  SNF    Equipment Recommendations  [facility]    Recommendations for Other Services       Precautions / Restrictions  Precautions Precautions: Fall Restrictions Weight Bearing Restrictions: Yes LLE Weight Bearing: Non weight bearing      Mobility Bed Mobility Overal bed mobility: Needs Assistance             General bed mobility comments: Deferred at pt request.    Transfers                      Balance Overall balance assessment: Needs assistance                                         ADL either performed or assessed with clinical judgement   ADL Overall ADL's : Needs assistance/impaired                                       General ADL Comments: Pt significantly functionally limited by cardiopulmonary status, increased pain and decreased functional use of his LLE. He currently has NWB orders with LLE to remain in boot at all times. He declines OOB/EOB activity 2/2 fatigue at this time. Per PT report, he demonstrates poor adherence to NWB status, but was able to come to full stand from elevated bed. Currently anticipate MAX A for LB ADL managemet.     Vision Patient Visual Report: No change from baseline       Perception     Praxis      Pertinent Vitals/Pain Pain Assessment: 0-10 Pain Score: 7  Pain Location: LLE Pain Descriptors / Indicators:  Discomfort;Grimacing;Guarding Pain Intervention(s): Monitored during session;Limited activity within patient's tolerance     Hand Dominance     Extremity/Trunk Assessment Upper Extremity Assessment Upper Extremity Assessment: Overall WFL for tasks assessed (BUE WFL (grossly 4+ to 5/5) with no focal weakness appreciated.)   Lower Extremity Assessment Lower Extremity Assessment: Defer to PT evaluation       Communication Communication Communication: No difficulties   Cognition Arousal/Alertness: Awake/alert (Minimally lethargic t/o session.) Behavior During Therapy: WFL for tasks assessed/performed Overall Cognitive Status: Within Functional Limits for tasks assessed                                  General Comments: Pt is plesant, conversational, and generally engaged in therapy session but endorses fatigue from therapy and recent bandage change during session.   General Comments  VSS t/o session. Pt remains on 2L [place] t/o session with SO2 at rest 95-96%    Exercises Other Exercises Other Exercises: Pt/caregiver (spouse present at bedside t/o session) educated on role of OT in acute setting, importance of functional activity for pt recovery, falls prevention strategies for home and hospital, considerations for ADL management in light of NWB status, compensatory bathing and dressing strategies, and routines modifications for improved safety and functional independence upon hospital DC.   Shoulder Instructions      Home Living Family/patient expects to be discharged to:: Private residence Living Arrangements: Spouse/significant other Available Help at Discharge: Family Type of Home: House Home Access: Level entry (At side entrance)     Home Layout: Multi-level Alternate Level Stairs-Number of Steps: several areas of 1-2 steps to access kitchen & various parts of home   Bathroom Shower/Tub: Tub/shower unit         Home Equipment: [place] - 2 wheels;Tub bench;Cane - quad;Cane - single point   Additional Comments: Bari RW; Futures trader; Walking Stick      Prior Functioning/Environment Level of Independence: Independent with assistive device(s)        Comments: Pt reports he had generally gotten back to baseline level of functional independence since his most recent foot sx ~10 months PTA. He endorses occasionally using his walking stick or straight cane for longer distance mobility.        OT Problem List: Decreased strength;Decreased coordination;Decreased activity tolerance;Decreased safety awareness;Impaired balance (sitting and/or standing);Decreased knowledge of use of DME or AE;Decreased range of motion;Pain      OT  Treatment/Interventions: Self-care/ADL training;Therapeutic exercise;Neuromuscular education;Therapeutic activities;Balance training;Energy conservation;DME and/or AE instruction;Patient/family education    OT Goals(Current goals can be found in the care plan section) Acute Rehab OT Goals Patient Stated Goal: To get back to working on my [place]. OT Goal Formulation: With patient/family Time For Goal Achievement: 01/25/21 Potential to Achieve Goals: Good ADL Goals Pt Will Perform Grooming: with adaptive equipment;sitting;with modified independence Pt Will Perform Lower Body Dressing: with min assist;with caregiver independent in assisting;with adaptive equipment;sit to/from stand Pt Will Transfer to Toilet: bedside commode;stand pivot transfer;with set-up;with supervision Pt Will Perform Toileting - Clothing Manipulation and hygiene: sit to/from stand;with set-up;with supervision  OT Frequency: Min 2X/week   Barriers to D/C: Inaccessible home environment  multi-level home       Co-evaluation              AM-PAC OT "6 Clicks" Daily Activity     Outcome Measure Help from another person eating meals?: A Little Help from another person taking care of  personal grooming?: A Little Help from another person toileting, which includes using toliet, bedpan, or urinal?: A Lot Help from another person bathing (including washing, rinsing, drying)?: A Lot Help from another person to put on and taking off regular upper body clothing?: A Little Help from another person to put on and taking off regular lower body clothing?: A Lot 6 Click Score: 15   End of Session    Activity Tolerance: Patient limited by fatigue Patient left: in bed;with call bell/phone within reach;with bed alarm set;with family/visitor present  OT Visit Diagnosis: Other abnormalities of gait and mobility (R26.89);Muscle weakness (generalized) (M62.81);Pain Pain - Right/Left: Left Pain - part of body: Leg;Ankle and  joints of foot                Time: 1335-1409 OT Time Calculation (min): 34 min Charges:  OT General Charges $OT Visit: 1 Visit OT Evaluation $OT Eval Moderate Complexity: 1 Mod OT Treatments $Self Care/Home Management : 8-22 mins  Shara Blazing, M.S., OTR/L Ascom: [phone] 01/11/21, 4:09 PM

## 2021-01-12 DIAGNOSIS — E1169 Type 2 diabetes mellitus with other specified complication: Secondary | ICD-10-CM | POA: Diagnosis not present

## 2021-01-12 DIAGNOSIS — L0390 Cellulitis, unspecified: Secondary | ICD-10-CM | POA: Diagnosis not present

## 2021-01-12 DIAGNOSIS — E11621 Type 2 diabetes mellitus with foot ulcer: Secondary | ICD-10-CM | POA: Diagnosis not present

## 2021-01-12 DIAGNOSIS — I25810 Atherosclerosis of coronary artery bypass graft(s) without angina pectoris: Secondary | ICD-10-CM | POA: Diagnosis not present

## 2021-01-12 DIAGNOSIS — L97509 Non-pressure chronic ulcer of other part of unspecified foot with unspecified severity: Secondary | ICD-10-CM | POA: Diagnosis not present

## 2021-01-12 DIAGNOSIS — I4891 Unspecified atrial fibrillation: Secondary | ICD-10-CM | POA: Diagnosis not present

## 2021-01-12 DIAGNOSIS — R7881 Bacteremia: Secondary | ICD-10-CM | POA: Diagnosis not present

## 2021-01-12 LAB — CBC
HCT: 40.3 % (ref 39.0–52.0)
Hemoglobin: 13.1 g/dL (ref 13.0–17.0)
MCH: 30.1 pg (ref 26.0–34.0)
MCHC: 32.5 g/dL (ref 30.0–36.0)
MCV: 92.6 fL (ref 80.0–100.0)
Platelets: 246 10*3/uL (ref 150–400)
RBC: 4.35 MIL/uL (ref 4.22–5.81)
RDW: 13.8 % (ref 11.5–15.5)
WBC: 8.3 10*3/uL (ref 4.0–10.5)
nRBC: 0 % (ref 0.0–0.2)

## 2021-01-12 LAB — BASIC METABOLIC PANEL
Anion gap: 7 (ref 5–15)
BUN: 15 mg/dL (ref 8–23)
CO2: 28 mmol/L (ref 22–32)
Calcium: 8.1 mg/dL — ABNORMAL LOW (ref 8.9–10.3)
Chloride: 102 mmol/L (ref 98–111)
Creatinine, Ser: 0.75 mg/dL (ref 0.61–1.24)
GFR, Estimated: >60 mL/min (ref >60)
Glucose, Bld: 230 mg/dL — ABNORMAL HIGH (ref 70–99)
Potassium: 4.3 mmol/L (ref 3.5–5.1)
Sodium: 137 mmol/L (ref 135–145)

## 2021-01-12 LAB — SURGICAL PATHOLOGY

## 2021-01-12 LAB — GLUCOSE, CAPILLARY
Glucose-Capillary: 184 mg/dL — ABNORMAL HIGH (ref 70–99)
Glucose-Capillary: 263 mg/dL — ABNORMAL HIGH (ref 70–99)
Glucose-Capillary: 260 mg/dL — ABNORMAL HIGH (ref 70–99)
Glucose-Capillary: 278 mg/dL — ABNORMAL HIGH (ref 70–99)

## 2021-01-12 MED ORDER — AMIODARONE HCL 200 MG PO TABS
200.0000 mg | ORAL_TABLET | Freq: Every day | ORAL | Status: DC
  Administered 2021-01-13 – 2021-01-19 (×7): 200 mg via ORAL
  Filled 2021-01-12 (×7): qty 1

## 2021-01-12 MED ORDER — OXYCODONE HCL 5 MG PO TABS
5.0000 mg | ORAL_TABLET | ORAL | Status: DC | PRN
  Administered 2021-01-13 – 2021-01-15 (×5): 5 mg via ORAL
  Filled 2021-01-12 (×5): qty 1

## 2021-01-12 MED ORDER — DILTIAZEM HCL 60 MG PO TABS
120.0000 mg | ORAL_TABLET | Freq: Three times a day (TID) | ORAL | Status: DC
  Administered 2021-01-12 – 2021-01-14 (×6): 120 mg via ORAL
  Filled 2021-01-12 (×2): qty 2
  Filled 2021-01-12: qty 4
  Filled 2021-01-12: qty 2
  Filled 2021-01-12: qty 4
  Filled 2021-01-12 (×2): qty 2
  Filled 2021-01-12: qty 4
  Filled 2021-01-12: qty 2
  Filled 2021-01-12: qty 4
  Filled 2021-01-12: qty 2
  Filled 2021-01-12: qty 4
  Filled 2021-01-12: qty 2

## 2021-01-12 MED ORDER — DIGOXIN 125 MCG PO TABS
0.0625 mg | ORAL_TABLET | Freq: Every day | ORAL | Status: DC
  Administered 2021-01-13 – 2021-01-19 (×7): 0.0625 mg via ORAL
  Filled 2021-01-12 (×8): qty 0.5

## 2021-01-12 NOTE — Progress Notes (Signed)
Heart rates been reasonable Transition off of IV dill to p.o. Diltizem 120 mg 3 times a day and reduce dose if heart rate is adequately controlled Reduce amiodarone to 200 mg once a day With digoxin to p.o. 0.0625 daily Obtain digoxin level in the morning Hopefully patient will be prepared for discharge out of the hospital probably to skilled nursing Maintain Eliquis 5 twice a day for anticoagulation

## 2021-01-12 NOTE — Progress Notes (Signed)
CCMD called to report 2.19 sec pause, pt symptomatic/cardizem gtt stopped at 1802. MD made aware/no new orders at this time. Will continue to monitor.

## 2021-01-12 NOTE — Progress Notes (Signed)
Date of Admission:  01/03/2021     ID: Alec Less. is a 65 y.o. male  Principal Problem:   Sepsis due to cellulitis [facility]) Active Problems:   Diabetes mellitus type 2 in obese ([place])   CAD (coronary artery disease)   CAD (coronary artery disease), autologous vein bypass graft   Pure hypercholesterolemia   RLS (restless legs syndrome)   Chronic osteomyelitis of left foot ([facility])   Osteomyelitis ([facility])    Subjective: Pt doing better Says pain leg much better Swelling leg better  Medications:   [START ON 01/13/2021] amiodarone  200 mg Oral Daily   apixaban  5 mg Oral BID   atorvastatin  20 mg Oral QHS   clopidogrel  75 mg Oral Daily   [START ON 01/13/2021] digoxin  0.0625 mg Oral Daily   diltiazem  120 mg Oral Q8H   ezetimibe  10 mg Oral QHS   insulin aspart  0-15 Units Subcutaneous TID WC   insulin aspart  0-5 Units Subcutaneous QHS   insulin aspart  9 Units Subcutaneous TID WC   insulin glargine-yfgn  42 Units Subcutaneous BID   metoprolol tartrate  50 mg Oral BID   pramipexole  2 mg Oral QHS   primidone  250 mg Oral BID   tamsulosin  0.4 mg Oral Daily    Objective: Vital signs in last 24 hours: Temp:  [97.7 F (36.5 C)-97.9 F (36.6 C)] 97.7 F (36.5 C) (09/15 1635) Pulse Rate:  [83-100] 83 (09/15 1635) Resp:  [13-27] 18 (09/15 1138) BP: (107-126)/(66-81) 116/75 (09/15 1635) SpO2:  [94 %-97 %] 97 % (09/15 1635) Weight:  [170.9 kg] 170.9 kg (09/15 0500)  PHYSICAL EXAM:  General: Alert, cooperative, no distress, appears stated age.  Head: Normocephalic, without obvious abnormality, atraumatic. Eyes: Conjunctivae clear, anicteric sclerae. Pupils are equal ENT Nares normal. No drainage or sinus tenderness. Lips, mucosa, and tongue normal. No Thrush Neck: Supple, symmetrical, no adenopathy, thyroid: non tender no carotid bruit and no JVD. Back: No CVA tenderness. Lungs: Clear to auscultation bilaterally. No Wheezing or Rhonchi. No rales. Heart: irregular  well controlled Abdomen: Soft, non-tender,not distended. Bowel sounds normal. No masses Extremities: ;eft foot     Skin: No rashes or lesions. Or bruising Lymph: Cervical, supraclavicular normal. Neurologic: Grossly non-focal  Lab Results Recent Labs    01/12/21 0922  WBC 8.3  HGB 13.1  HCT 40.3  NA 137  K 4.3  CL 102  CO2 28  BUN 15  CREATININE 0.75   Liver Panel No results for input(s): PROT, ALBUMIN, AST, ALT, ALKPHOS, BILITOT, BILIDIR, IBILI in the last 72 hours. Sedimentation Rate No results for input(s): ESRSEDRATE in the last 72 hours. C-Reactive Protein No results for input(s): CRP in the last 72 hours.  Microbiology: [facility] group G bacteremia Repeat culture from 01/06/21 NG 9/7 sueprficial wound culture- mixed organisms including staph    Assessment/Plan: Group G streptococcus bacteremia- source  could be left foot ulcer- the venous edema and b/l blistering with oozing legs could be  source as well- he is s/p resection of the Left fifth metatarsal head on 01/10/21 2 d echo shows normal mitral and aortic valve Currently on unasyn day 10 of IV  Chronic left foot ulcer- underwent met head resection left 5th toe- therapeutic amputation Pathology - VIABLE BONE AND SOFT TISSUE, WITHOUT ACUTE INFLAMMATION/OSTEOMYELITIS. Will need PO augmentin on discharge until 01/21/21  Venous edema legs with blisters - dried up-    AFIB -  well controlled now - pt on Amiodrone, digoxin, diltiazem and metoprolol On eliquis and plavix  CAD s/p CABG had stent to RCA last year- on plavix Eliquis    DM on insulin   H/o DFI-left foot  2nd toe amputation last year- with MRSA/GBS_ and treated appropriately    Discussed the management with the patient and hospitalist ID will sign off- call if needed

## 2021-01-12 NOTE — Progress Notes (Signed)
[facility] PROGRESS NOTE   Alec A Klinker Jr. [id number] DOB: 1956/02/08 DOA: 01/03/2021  PCP: [name], MD  Brief History/Interval Summary: 65 year old male with history of hypertension, congestive heart failure, hyperlipidemia, diabetes type 2, chronic ulcer on the left foot presented to the emergency department with complaints of fever, chills.  On presentation he was febrile with temperature of 104, he was tachycardic.  He was found to have worsening ulcer of the left foot.  Patient was hospitalized.  Seen by podiatry and underwent ray amputation.  He also developed atrial fibrillation with RVR during this hospital stay.  Consultants: Podiatry.  Cardiology  Procedures: Ray amputation left fifth metatarsal.  Tendo Achilles lengthening on the left.  Wound debridement left plantar fifth metatarsal phalangeal joint  Antibiotics: Anti-infectives (From admission, onward)    Start     Dose/Rate Route Frequency Ordered Stop   01/06/21 1230  Ampicillin-Sulbactam (UNASYN) 3 g in sodium chloride 0.9 % 100 mL IVPB        3 g 200 mL/hr over 30 Minutes Intravenous Every 6 hours 01/06/21 1130     01/05/21 0600  ceFAZolin (ANCEF) IVPB 2g/100 mL premix        2 g 200 mL/hr over 30 Minutes Intravenous On call to O.R. 01/04/21 1711 01/05/21 0529   01/04/21 1400  cefTRIAXone (ROCEPHIN) 2 g in sodium chloride 0.9 % 100 mL IVPB  Status:  Discontinued        2 g 200 mL/hr over 30 Minutes Intravenous Every 24 hours 01/04/21 0853 01/06/21 1130   01/04/21 1000  vancomycin (VANCOREADY) IVPB 1750 mg/350 mL  Status:  Discontinued        1,750 mg 175 mL/hr over 120 Minutes Intravenous Every 12 hours 01/03/21 2154 01/04/21 0757   01/04/21 1000  vancomycin (VANCOREADY) IVPB 1500 mg/300 mL  Status:  Discontinued        1,500 mg 150 mL/hr over 120 Minutes Intravenous Every 12 hours 01/04/21 0757 01/04/21 0853   01/03/21 2200  piperacillin-tazobactam (ZOSYN) IVPB 3.375 g  Status:   Discontinued        3.375 g 12.5 mL/hr over 240 Minutes Intravenous Every 8 hours 01/03/21 2154 01/04/21 0853   01/03/21 2100  vancomycin (VANCOREADY) IVPB 500 mg/100 mL        500 mg 100 mL/hr over 60 Minutes Intravenous  Once 01/03/21 1848 01/04/21 0019   01/03/21 1900  ceFEPIme (MAXIPIME) 2 g in sodium chloride 0.9 % 100 mL IVPB        2 g 200 mL/hr over 30 Minutes Intravenous  Once 01/03/21 1845 01/03/21 1925   01/03/21 1845  metroNIDAZOLE (FLAGYL) IVPB 500 mg  Status:  Discontinued        500 mg 100 mL/hr over 60 Minutes Intravenous Every 8 hours 01/03/21 1841 01/04/21 0853   01/03/21 1845  vancomycin (VANCOREADY) IVPB 2000 mg/400 mL        2,000 mg 200 mL/hr over 120 Minutes Intravenous  Once 01/03/21 1845 01/03/21 2248       Subjective/Interval History: Patient denies any pain in the left lower extremity.  No chest pain or shortness of breath.     Assessment/Plan:  Left foot cellulitis with chronic ulcer/streptococcal bacteremia Presented with worsening left foot ulcer.  MRI did not show any signs of osteomyelitis but showed extensive cellulitis and myositis.   Blood cultures positive for group B streptococcus.  Infectious disease was consulted.  Patient currently on Unasyn. Patient underwent amputation of the affected  area on 9/13.  He is nonweightbearing on the left lower extremity. Antibiotic duration to be determined by infectious disease. Transthoracic echocardiogram did not suggest any vegetation. Patient was also seen by vascular surgery during this hospitalization and underwent left lower extremity angiogram.  No significant stenosis was noted. Seems to be stable.  Noted to be afebrile.  WBC is normal.  Atrial fibrillation with RVR This is apparently a new diagnosis for him.  He was on apixaban prior to admission but was on it for peripheral artery disease.  Cardiology has been following him here in the hospital.  Was on a amiodarone infusion which has been changed  over to oral.  Patient remains on diltiazem infusion.  Eliquis was initially placed on hold for surgery.  Resumed yesterday evening.  Patient also remains on digoxin and metoprolol.  Waiting on cardiology input.   Diabetes mellitus type 2, uncontrolled with hyperglycemia Noted to be on SSI and glargine.  CBGs poorly controlled.  HbA1c 8.9.   Prior to admission patient was on metformin Ozempic Lantus NovoLog. Glargine insulin dose was increased slightly last night.  Continue to monitor CBGs trends and we can adjust dose accordingly.  Coronary artery disease Stable.  Continue home medications.  Essential hypertension Monitor blood pressures closely.  History of BPH Continue Flomax  Morbid obesity Estimated body mass index is 45.86 kg/m as calculated from the following:   Height as of this encounter: '6\' 4"'$  (1.93 m).   Weight as of this encounter: 170.9 kg.    DVT Prophylaxis: Apixaban Code Status: Full code Family Communication: Discussed with patient.  No family at bedside. Disposition Plan: Seen by PT.  SNF is recommended.  Status is: Inpatient  Remains inpatient appropriate because:IV treatments appropriate due to intensity of illness or inability to take PO and Inpatient level of care appropriate due to severity of illness  Dispo: The patient is from: Home              Anticipated d/c is to:  To be determined              Patient currently is not medically stable to d/c.   Difficult to place patient No      Medications: Scheduled:  amiodarone  400 mg Oral Daily   apixaban  5 mg Oral BID   atorvastatin  20 mg Oral QHS   clopidogrel  75 mg Oral Daily   digoxin  0.125 mg Intravenous Daily   ezetimibe  10 mg Oral QHS   insulin aspart  0-15 Units Subcutaneous TID WC   insulin aspart  0-5 Units Subcutaneous QHS   insulin aspart  9 Units Subcutaneous TID WC   insulin glargine-yfgn  42 Units Subcutaneous BID   metoprolol tartrate  50 mg Oral BID   pramipexole  2 mg  Oral QHS   primidone  250 mg Oral BID   tamsulosin  0.4 mg Oral Daily   Continuous:  ampicillin-sulbactam (UNASYN) IV 3 g (01/12/21 0541)   diltiazem (CARDIZEM) infusion 15 mg/hr (01/12/21 1024)   [id number] **OR** acetaminophen, metoprolol tartrate, ondansetron **OR** ondansetron (ZOFRAN) IV   Objective:  Vital Signs  Vitals:   01/12/21 0419 01/12/21 0500 01/12/21 0740 01/12/21 1138  BP: 110/71  107/69 113/77  Pulse: 92  94   Resp: 18   18  Temp: 97.7 F (36.5 C)  97.8 F (36.6 C) 97.9 F (36.6 C)  TempSrc: Oral  Axillary Axillary  SpO2:   94% 94%  Weight:  Marland Kitchen)  170.9 kg    Height:        Intake/Output Summary (Last 24 hours) at 01/12/2021 1217 Last data filed at 01/12/2021 1155 Gross per 24 hour  Intake 1773.41 ml  Output 1300 ml  Net 473.41 ml    Filed Weights   01/09/21 0547 01/10/21 1611 01/12/21 0500  Weight: (!) 162.8 kg (!) 162.8 kg (!) 170.9 kg    General appearance: Awake alert.  In no distress Resp: Clear to auscultation bilaterally.  Normal effort Cardio: S1-S2 is irregularly irregular GI: Abdomen is soft.  Nontender nondistended.  Bowel sounds are present normal.  No masses organomegaly Extremities: Left lower extremity is in a cast. Neurologic: Alert and oriented x3.  No focal neurological deficits.      Lab Results:  Data Reviewed: I have personally reviewed following labs and imaging studies  CBC: Recent Labs  Lab 01/08/21 0533 01/12/21 0922  WBC 7.7 8.3  HGB 13.5 13.1  HCT 40.2 40.3  MCV 92.8 92.6  PLT 153 246     Basic Metabolic Panel: Recent Labs  Lab 01/08/21 0533 01/12/21 0922  NA 136 137  K 4.0 4.3  CL 103 102  CO2 26 28  GLUCOSE 289* 230*  BUN 27* 15  CREATININE 0.95 0.75  CALCIUM 7.6* 8.1*     GFR: Estimated Creatinine Clearance: 156.8 mL/min (by C-G formula based on SCr of 0.75 mg/dL).     CBG: Recent Labs  Lab 01/11/21 1150 01/11/21 1703 01/11/21 2208 01/12/21 0740 01/12/21 1142  GLUCAP  312* 341* 398* 184* 263*      Recent Results (from the past 240 hour(s))  Blood culture (routine single)     Status: Abnormal   Collection Time: 01/03/21  5:37 PM   Specimen: BLOOD  Result Value Ref Range Status   Specimen Description   Final    BLOOD BLOOD RIGHT FOREARM Performed at [facility], [address], [place], [place] [address]    Special Requests   Final    BOTTLES DRAWN AEROBIC AND ANAEROBIC Blood Culture adequate volume Performed at [facility], [address] [place], [place] [address]    Culture  Setup Time   Final    GRAM POSITIVE COCCI AEROBIC BOTTLE ONLY CRITICAL RESULT CALLED TO, READ BACK BY AND VERIFIED WITH: SAMANTHA RAUER 01/04/21 0749 KLW GRAM POSITIVE RODS ANAEROBIC BOTTLE ONLY CRITICAL RESULT CALLED TO, READ BACK BY AND VERIFIED WITH: BRANDON BEERS 01/08/21 @ 1110 BY SB Performed at [facility], [address] [place], [place] [address]    Culture (A)  Final    STREPTOCOCCUS GROUP G PROPIONIBACTERIUM ACNES Standardized susceptibility testing for this organism is not available. Performed at [facility], [place] [address], [place], [place] [address]    Report Status 01/10/2021 FINAL  Final   Organism ID, Bacteria STREPTOCOCCUS GROUP G  Final      Susceptibility   Streptococcus group g - MIC*    CLINDAMYCIN <=0.25 SENSITIVE Sensitive     AMPICILLIN <=0.25 SENSITIVE Sensitive     ERYTHROMYCIN <=0.12 SENSITIVE Sensitive     VANCOMYCIN 0.5 SENSITIVE Sensitive     CEFTRIAXONE <=0.12 SENSITIVE Sensitive     LEVOFLOXACIN 0.5 SENSITIVE Sensitive     PENICILLIN Value in next row Sensitive      SENSITIVEMIC =0.06S    * STREPTOCOCCUS GROUP G  Blood Culture ID Panel (Reflexed)     Status: Abnormal   Collection Time: 01/03/21  5:37 PM  Result Value Ref  Range Status   Enterococcus faecalis NOT DETECTED NOT DETECTED Final   Enterococcus Faecium NOT DETECTED NOT DETECTED Final   Listeria monocytogenes NOT DETECTED NOT  DETECTED Final   Staphylococcus species NOT DETECTED NOT DETECTED Final   Staphylococcus aureus (BCID) NOT DETECTED NOT DETECTED Final   Staphylococcus epidermidis NOT DETECTED NOT DETECTED Final   Staphylococcus lugdunensis NOT DETECTED NOT DETECTED Final   Streptococcus species DETECTED (A) NOT DETECTED Final    Comment: Not Enterococcus species, Streptococcus agalactiae, Streptococcus pyogenes, or Streptococcus pneumoniae. CRITICAL RESULT CALLED TO, READ BACK BY AND VERIFIED WITH: SAMANTHA RAUER 01/04/21 0749 KLW    Streptococcus agalactiae NOT DETECTED NOT DETECTED Final   Streptococcus pneumoniae NOT DETECTED NOT DETECTED Final   Streptococcus pyogenes NOT DETECTED NOT DETECTED Final   A.calcoaceticus-baumannii NOT DETECTED NOT DETECTED Final   Bacteroides fragilis NOT DETECTED NOT DETECTED Final   Enterobacterales NOT DETECTED NOT DETECTED Final   Enterobacter cloacae complex NOT DETECTED NOT DETECTED Final   Escherichia coli NOT DETECTED NOT DETECTED Final   Klebsiella aerogenes NOT DETECTED NOT DETECTED Final   Klebsiella oxytoca NOT DETECTED NOT DETECTED Final   Klebsiella pneumoniae NOT DETECTED NOT DETECTED Final   Proteus species NOT DETECTED NOT DETECTED Final   Salmonella species NOT DETECTED NOT DETECTED Final   Serratia marcescens NOT DETECTED NOT DETECTED Final   Haemophilus influenzae NOT DETECTED NOT DETECTED Final   Neisseria meningitidis NOT DETECTED NOT DETECTED Final   Pseudomonas aeruginosa NOT DETECTED NOT DETECTED Final   Stenotrophomonas maltophilia NOT DETECTED NOT DETECTED Final   Candida albicans NOT DETECTED NOT DETECTED Final   Candida auris NOT DETECTED NOT DETECTED Final   Candida glabrata NOT DETECTED NOT DETECTED Final   Candida krusei NOT DETECTED NOT DETECTED Final   Candida parapsilosis NOT DETECTED NOT DETECTED Final   Candida tropicalis NOT DETECTED NOT DETECTED Final   Cryptococcus neoformans/gattii NOT DETECTED NOT DETECTED Final    Comment:  Performed at [facility], [address] [place], [place] [address]  Resp Panel by RT-PCR (Flu A&B, Covid) Nasopharyngeal Swab     Status: None   Collection Time: 01/03/21  6:24 PM   Specimen: Nasopharyngeal Swab; Nasopharyngeal(NP) swabs in vial transport medium  Result Value Ref Range Status   SARS Coronavirus 2 by RT PCR NEGATIVE NEGATIVE Final    Comment: (NOTE) SARS-CoV-2 target nucleic acids are NOT DETECTED.  The SARS-CoV-2 RNA is generally detectable in upper respiratory specimens during the acute phase of infection. The lowest concentration of SARS-CoV-2 viral copies this assay can detect is 138 copies/mL. A negative result does not preclude SARS-Cov-2 infection and should not be used as the sole basis for treatment or other patient management decisions. A negative result may occur with  improper specimen collection/handling, submission of specimen other than nasopharyngeal swab, presence of viral mutation(s) within the areas targeted by this assay, and inadequate number of viral copies(<138 copies/mL). A negative result must be combined with clinical observations, patient history, and epidemiological information. The expected result is Negative.  Fact Sheet for Patients:  EntrepreneurPulse.com.au  Fact Sheet for Healthcare Providers:  IncredibleEmployment.be  This test is no t yet approved or cleared by the [place] FDA and  has been authorized for detection and/or diagnosis of SARS-CoV-2 by FDA under an Emergency Use Authorization (EUA). This EUA will remain  in effect (meaning this test can be used) for the duration of the COVID-19 declaration under Section 564(b)(1) of the Act, 21 U.S.C.section 360bbb-3(b)(1), unless the  authorization is terminated  or revoked sooner.       Influenza A by PCR NEGATIVE NEGATIVE Final   Influenza B by PCR NEGATIVE NEGATIVE Final    Comment: (NOTE) The Xpert Xpress  SARS-CoV-2/FLU/RSV plus assay is intended as an aid in the diagnosis of influenza from Nasopharyngeal swab specimens and should not be used as a sole basis for treatment. Nasal washings and aspirates are unacceptable for Xpert Xpress SARS-CoV-2/FLU/RSV testing.  Fact Sheet for Patients: EntrepreneurPulse.com.au  Fact Sheet for Healthcare Providers: IncredibleEmployment.be  This test is not yet approved or cleared by the [place] FDA and has been authorized for detection and/or diagnosis of SARS-CoV-2 by FDA under an Emergency Use Authorization (EUA). This EUA will remain in effect (meaning this test can be used) for the duration of the COVID-19 declaration under Section 564(b)(1) of the Act, 21 U.S.C. section 360bbb-3(b)(1), unless the authorization is terminated or revoked.  Performed at [facility], [place], [address] [place], [place] [address]   Blood culture (single)     Status: Abnormal   Collection Time: 01/03/21  7:15 PM   Specimen: BLOOD  Result Value Ref Range Status   Specimen Description   Final    BLOOD BLOOD LEFT HAND Performed at [facility], [address] [place], [place] [address]    Special Requests   Final    BOTTLES DRAWN AEROBIC AND ANAEROBIC Blood Culture results may not be optimal due to an excessive volume of blood received in culture bottles Performed at [facility], [address] [place], [place] [address]    Culture  Setup Time   Final    GRAM POSITIVE COCCI ANAEROBIC BOTTLE ONLY CRITICAL RESULT CALLED TO, READ BACK BY AND VERIFIED WITH: Coralie Carpen 01/04/21 Toco Performed at [facility], [address] [place], [place] [address]    Culture (A)  Final    STREPTOCOCCUS GROUP G SUSCEPTIBILITIES PERFORMED ON PREVIOUS CULTURE WITHIN THE LAST 5 DAYS. Performed at [facility], [place] [address] [place], [place] [address]    Report Status 01/06/2021 FINAL   Final  MRSA Next Gen by PCR, Nasal     Status: None   Collection Time: 01/04/21  7:52 AM   Specimen: Nasal Mucosa; Nasal Swab  Result Value Ref Range Status   MRSA by PCR Next Gen NOT DETECTED NOT DETECTED Final    Comment: (NOTE) The GeneXpert MRSA Assay (FDA approved for NASAL specimens only), is one component of a comprehensive MRSA colonization surveillance program. It is not intended to diagnose MRSA infection nor to guide or monitor treatment for MRSA infections. Test performance is not FDA approved in patients less than 40 years old. Performed at [facility], [address] [place], [place] [address]   Aerobic/Anaerobic Culture w Gram Stain (surgical/deep wound)     Status: None   Collection Time: 01/04/21 12:39 PM   Specimen: Foot; Wound  Result Value Ref Range Status   Specimen Description   Final    FOOT LEFT Performed at [facility], [address] [place], [place] [address]    Special Requests   Final    NONE Performed at [facility], [address] [place], [place] [address]    Gram Stain   Final    FEW SQUAMOUS EPITHELIAL CELLS PRESENT FEW WBC SEEN MODERATE GRAM POSITIVE COCCI FEW GRAM POSITIVE RODS    Culture   Final    RARE STAPHYLOCOCCUS AUREUS WITHIN MIXED  ORGANISMS NO ANAEROBES ISOLATED Performed at [facility], [place] [address] [place], [place] [address]    Report Status 01/10/2021 FINAL  Final   Organism ID, Bacteria STAPHYLOCOCCUS AUREUS  Final      Susceptibility   Staphylococcus aureus - MIC*    CIPROFLOXACIN <=0.5 SENSITIVE Sensitive     ERYTHROMYCIN RESISTANT Resistant     GENTAMICIN <=0.5 SENSITIVE Sensitive     OXACILLIN 0.5 SENSITIVE Sensitive     TETRACYCLINE <=1 SENSITIVE Sensitive     VANCOMYCIN <=0.5 SENSITIVE Sensitive     TRIMETH/SULFA 80 RESISTANT Resistant     CLINDAMYCIN RESISTANT Resistant     RIFAMPIN <=0.5 SENSITIVE Sensitive     Inducible Clindamycin POSITIVE Resistant     * RARE  STAPHYLOCOCCUS AUREUS  CULTURE, BLOOD (ROUTINE X 2) w Reflex to ID Panel     Status: None   Collection Time: 01/06/21 12:23 AM   Specimen: BLOOD  Result Value Ref Range Status   Specimen Description BLOOD RIGHT ASSIST CONTROL  Final   Special Requests   Final    BOTTLES DRAWN AEROBIC AND ANAEROBIC Blood Culture adequate volume   Culture   Final    NO GROWTH 5 DAYS Performed at [facility], [address] [place], [place] [address]    Report Status 01/11/2021 FINAL  Final  CULTURE, BLOOD (ROUTINE X 2) w Reflex to ID Panel     Status: None   Collection Time: 01/06/21 12:25 AM   Specimen: BLOOD  Result Value Ref Range Status   Specimen Description BLOOD LEFT ASSIST CONTROL  Final   Special Requests   Final    BOTTLES DRAWN AEROBIC ONLY Blood Culture adequate volume   Culture   Final    NO GROWTH 5 DAYS Performed at [facility], [address] [place], [place] [address]    Report Status 01/11/2021 FINAL  Final       Radiology Studies: DG Foot Complete Left  Result Date: 01/10/2021 CLINICAL DATA:  Postop EXAM: LEFT FOOT - COMPLETE 3+ VIEW COMPARISON:  MRI left foot 01/04/2021, left foot 01/03/2021 FINDINGS: Interval proximal fifth digit metatarsal amputation with clean margins. Redemonstration of prior transmetatarsal third digit amputation with clear margins. No cortical erosion or destruction. There is no evidence of fracture or dislocation. There is no evidence of arthropathy or other focal bone abnormality. Subcutaneus soft tissue edema and emphysema overlying the fifth digit likely postsurgical. Subcutaneus soft tissue emphysema overlying the distal posterior ankle with associated skin staples. IMPRESSION: 1. Interval proximal fifth digit metatarsal amputation with clean margins. 2. Prior transmetatarsal third digit amputation with clear margins. 3. Associated subcutaneus soft tissue edema emphysema likely postsurgical. Electronically Signed   By: Iven Finn M.D.   On: 01/10/2021 19:47       LOS: 9 days   Erikson Danzy [employer] on [email or web address]  01/12/2021, 12:17 PM

## 2021-01-12 NOTE — Progress Notes (Signed)
PT Cancellation Note  Patient Details Name: [name]. MRN: [id number] DOB: 07/21/1955   Cancelled Treatment:     PT attempt. Pt sleeping upon arriving but easily awakes. He requested holding PT today. Spouse is bringing personal knee scooter this evening and he requested to wait till its here to have PT session. Will return tomorrow morning per pt request and continue to follow per current POC.    Willette Pa 01/12/2021, 3:42 PM

## 2021-01-12 NOTE — Progress Notes (Signed)
Occupational Therapy Treatment Patient Details Name: [name]. MRN: [id number] DOB: 1955/07/31 Today's Date: 01/12/2021   History of present illness Per MD Notes: [name] is a 65 year old male with history of hypertension, congestive heart failure, hyperlipidemia, diabetes type 2, chronic ulcer on the left foot presented to the emergency department with complaints of fever, chills.  On presentation he was febrile with temperature of 104, he was tachycardic.  He was found to have worsening ulcer of the left foot.  Patient was hospitalized.  Seen by podiatry and underwent ray amputation.  He also developed atrial fibrillation with RVR during this hospital stay.   OT comments  Pt requesting to transfer to [facility] upon OT arrival.  OT assisted with SPT using RW, vc for sequencing and technique.  Removed R non-skid sock for easier sliding of R foot as pt was unable to hop to maintain NWB to LLE.  Still had difficulty maintaining NWB and did rest L heel within CAM boot on the ground.  Pt had successful BM and managed toilet hygiene with set up.  Returned back to EOB for instruction in use of sockaid, reacher, and dressing stick to manage LB dressing.  Pt has reacher at home, likely spouse will continue to manage socks per pt, but pt found dressing stick helpful and OT advised on options to purchase.  Pt inquired about toilet transfers at home; pt has elevated seats and toilet safety rails that he can install.  OT agreed with this, as pt would need BUE support for safe transfer.  Pt reports he's more likely to request [facility] upon discharge, but OT has concerns about his stairs to enter home.  Pt would likely not be able to maintain his NWB status to LLE, but will defer to PT for stair training if appropriate; would benefit from ramp access.  Pt left in bed with alarm set, phone and call light within reach, and all other needs met. Pt will continue to benefit from acute OT for increasing safety  and indep with ADL transfers.    Recommendations for follow up therapy are one component of a multi-disciplinary discharge planning process, led by the attending physician.  Recommendations may be updated based on patient status, additional functional criteria and insurance authorization.    Follow Up Recommendations  SNF    Equipment Recommendations  3 in 1 bedside commode          Precautions / Restrictions Precautions Precautions: Fall Required Braces or Orthoses:  (CAM boot to L foot) Restrictions Weight Bearing Restrictions: Yes LLE Weight Bearing: Non weight bearing       Mobility Bed Mobility Overal bed mobility: Needs Assistance Bed Mobility: Supine to Sit;Sit to Supine     Supine to sit: Supervision Sit to supine: Supervision   General bed mobility comments: increased time/effort, heavy use of bilat UEs requried to complete movement transition Patient Response: Cooperative  Transfers   Equipment used: Rolling walker (2 wheeled) Transfers: Sit to/from Stand Sit to Stand: Min assist         General transfer comment: unable to stand from standard bed height; requires bed surface elevated approx 5-6" to successfully complete sit/stand.  Educated in L LE positioning to otimize adherence to NWB with movement transition    Balance Overall balance assessment: Needs assistance Sitting-balance support: No upper extremity supported;Feet supported Sitting balance-Leahy Scale: Normal     Standing balance support: Bilateral upper extremity supported Standing balance-Leahy Scale: Fair  ADL either performed or assessed with clinical judgement   ADL Overall ADL's : Needs assistance/impaired                     Lower Body Dressing: Moderate assistance;Sit to/from stand Lower Body Dressing Details (indicate cue type and reason): instruction with reacher/sockaid/dressing stick Toilet Transfer: Minimal  assistance;BSC;RW Toilet Transfer Details (indicate cue type and reason): SPT from elevated bed Toileting- Clothing Manipulation and Hygiene: Set up;Sitting/lateral lean Toileting - Clothing Manipulation Details (indicate cue type and reason): BSC     Functional mobility during ADLs: Minimal assistance;Rolling walker General ADL Comments: SPT with RW, difficulty maintaining complete NWB to LLE, pt tends to rest L heel on the floor within CAM boot.     Vision Patient Visual Report: No change from baseline                Cognition Arousal/Alertness: Awake/alert Behavior During Therapy: WFL for tasks assessed/performed Overall Cognitive Status: Within Functional Limits for tasks assessed                                          Exercises                  Pertinent Vitals/ Pain       Pain Assessment: 0-10 Pain Score: 0-No pain Pain Location: Pt denies any pain in LLE at rest or with activity Pain Intervention(s): Monitored during session                                                          Frequency  Min 2X/week        Progress Toward Goals  OT Goals(current goals can now be found in the care plan section)  Progress towards OT goals: Progressing toward goals  Acute Rehab OT Goals Patient Stated Goal: 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 OT Goal Formulation: With patient Time For Goal Achievement: 01/25/21 Potential to Achieve Goals: Good  Plan Discharge plan remains appropriate;Other (comment) (Short term rehab recommended d/t pt has stairs to enter home, but pt requests home health if possible.  Pt would likely need a ramp.)                     AM-PAC OT "6 Clicks" Daily Activity     Outcome Measure   Help from another person eating meals?: None Help from another person taking care of personal grooming?: A Little Help from another person toileting, which includes using toliet, bedpan, or  urinal?: A Lot Help from another person bathing (including washing, rinsing, drying)?: A Lot Help from another person to put on and taking off regular upper body clothing?: A Little Help from another person to put on and taking off regular lower body clothing?: A Lot 6 Click Score: 16    End of Session Equipment Utilized During Treatment: Rolling walker  OT Visit Diagnosis: Other abnormalities of gait and mobility (R26.89);Muscle weakness (generalized) (M62.81)   Activity Tolerance Patient tolerated treatment well   Patient Left in bed;with call bell/phone within reach;with bed alarm set   Nurse Communication Mobility status;Other (comment) (Notified NT of successful BM)  Time: 2641-5830 OT Time Calculation (min): 40 min  Charges: OT General Charges $OT Visit: 1 Visit OT Treatments $Self Care/Home Management : 38-52 mins  Leta Speller, MS, OTR/L   Darleene Cleaver 01/12/2021, 5:17 PM

## 2021-01-12 NOTE — Plan of Care (Signed)
Patient slept few hours with BiPAP on,   Patient on continuous Cardizem drip,  HR between 120 to 88 whole night, C/O headache to frontal lobe rated to 7/10. Given Tylenol once.   No BM.     Problem: Education: Goal: Knowledge of General Education information will improve Description: Including pain rating scale, medication(s)/side effects and non-pharmacologic comfort measures Outcome: Progressing   Problem: Health Behavior/Discharge Planning: Goal: Ability to manage health-related needs will improve Outcome: Progressing   Problem: Clinical Measurements: Goal: Ability to maintain clinical measurements within normal limits will improve Outcome: Progressing Goal: Will remain free from infection Outcome: Progressing Goal: Diagnostic test results will improve Outcome: Progressing Goal: Respiratory complications will improve Outcome: Progressing Goal: Cardiovascular complication will be avoided Outcome: Progressing   Problem: Activity: Goal: Risk for activity intolerance will decrease Outcome: Progressing   Problem: Nutrition: Goal: Adequate nutrition will be maintained Outcome: Progressing   Problem: Coping: Goal: Level of anxiety will decrease Outcome: Progressing   Problem: Elimination: Goal: Will not experience complications related to bowel motility Outcome: Progressing Goal: Will not experience complications related to urinary retention Outcome: Progressing   Problem: Pain Managment: Goal: General experience of comfort will improve Outcome: Progressing   Problem: Safety: Goal: Ability to remain free from injury will improve Outcome: Progressing   Problem: Skin Integrity: Goal: Risk for impaired skin integrity will decrease Outcome: Progressing

## 2021-01-13 ENCOUNTER — Inpatient Hospital Stay: Payer: Medicare Other

## 2021-01-13 DIAGNOSIS — I25810 Atherosclerosis of coronary artery bypass graft(s) without angina pectoris: Secondary | ICD-10-CM | POA: Diagnosis not present

## 2021-01-13 DIAGNOSIS — L0390 Cellulitis, unspecified: Secondary | ICD-10-CM | POA: Diagnosis not present

## 2021-01-13 DIAGNOSIS — E1169 Type 2 diabetes mellitus with other specified complication: Secondary | ICD-10-CM | POA: Diagnosis not present

## 2021-01-13 DIAGNOSIS — I4891 Unspecified atrial fibrillation: Secondary | ICD-10-CM | POA: Diagnosis not present

## 2021-01-13 LAB — GLUCOSE, CAPILLARY
Glucose-Capillary: 185 mg/dL — ABNORMAL HIGH (ref 70–99)
Glucose-Capillary: 205 mg/dL — ABNORMAL HIGH (ref 70–99)
Glucose-Capillary: 145 mg/dL — ABNORMAL HIGH (ref 70–99)
Glucose-Capillary: 297 mg/dL — ABNORMAL HIGH (ref 70–99)

## 2021-01-13 MED ORDER — INSULIN GLARGINE-YFGN 100 UNIT/ML ~~LOC~~ SOLN
44.0000 [IU] | Freq: Two times a day (BID) | SUBCUTANEOUS | Status: DC
  Administered 2021-01-13 – 2021-01-15 (×4): 44 [IU] via SUBCUTANEOUS
  Filled 2021-01-13 (×5): qty 0.44

## 2021-01-13 MED ORDER — FUROSEMIDE 10 MG/ML IJ SOLN
20.0000 mg | Freq: Once | INTRAMUSCULAR | Status: AC
  Administered 2021-01-13: 20 mg via INTRAVENOUS
  Filled 2021-01-13: qty 2

## 2021-01-13 MED ORDER — INSULIN ASPART 100 UNIT/ML IJ SOLN
12.0000 [IU] | Freq: Three times a day (TID) | INTRAMUSCULAR | Status: DC
  Administered 2021-01-13 – 2021-01-19 (×17): 12 [IU] via SUBCUTANEOUS
  Filled 2021-01-13 (×17): qty 1

## 2021-01-13 NOTE — Care Management Important Message (Signed)
Important Message  Patient Details  Name: [name]. MRN: [id number] Date of Birth: Nov 28, 1955   Medicare Important Message Given:  Yes     [name] 01/13/2021, 2:49 PM

## 2021-01-13 NOTE — Progress Notes (Signed)
Cpap refused by pt 

## 2021-01-13 NOTE — Progress Notes (Signed)
Physical Therapy Treatment Patient Details Name: [name]. MRN: [id number] DOB: 1956-03-24 Today's Date: 01/13/2021   History of Present Illness Per MD Notes: Alec Less. is a 65 year old male with history of hypertension, congestive heart failure, hyperlipidemia, diabetes type 2, chronic ulcer on the left foot presented to the emergency department with complaints of fever, chills.  On presentation he was febrile with temperature of 104, he was tachycardic.  He was found to have worsening ulcer of the left foot.  Patient was hospitalized.  Seen by podiatry and underwent ray amputation.  He also developed atrial fibrillation with RVR during this hospital stay.    PT Comments    Patient is making progress towards his therapy goals. Trialed transferring to L knee scooter for improve adherence to L NWB status. Pt requires significant elevation of bed to be able to achieve sit to stand transfer and heavy usage of bed rails to assist with movement of L foot onto scooter. On room air, pt desaturated to 85% SpO2 with bed mobility and placed back on 2.5L O2. Nurse notified of oxygen desaturation. In depth conversation regarding discharge recommendation of SNF due to barriers within the home and pt more agreeable to short term rehab. Pt will continue to benefit from skilled PT services to increase overall mobility.    Recommendations for follow up therapy are one component of a multi-disciplinary discharge planning process, led by the attending physician.  Recommendations may be updated based on patient status, additional functional criteria and insurance authorization.  Follow Up Recommendations  SNF     Equipment Recommendations  Wheelchair (measurements PT);Wheelchair cushion (measurements PT)    Recommendations for Other Services       Precautions / Restrictions Precautions Precautions: Fall Restrictions Weight Bearing Restrictions: Yes LLE Weight Bearing: Non weight  bearing     Mobility  Bed Mobility Overal bed mobility: Needs Assistance Bed Mobility: Supine to Sit;Sit to Supine     Supine to sit: Supervision Sit to supine: Supervision   General bed mobility comments: Increased time required to complete and cues for seated scooting towards EOB. Cues for usage of UEs to lift bottom for reduce shear forces and protect skin integrity    Transfers Overall transfer level: Needs assistance Equipment used: Rolling walker (2 wheeled) Transfers: Sit to/from Stand Sit to Stand: Min assist         General transfer comment: Requires elevated bed surface and MinA for coming to standing with bariatric RW. Pt requiring cues for L LE NWB and hand placement to push from bed. Trialed transfer to L knee scooter and pt requiring minA and heavy usage of bed rails to stabilize himself. Cues for proper placement of L LE onto scooter.  Ambulation/Gait                 Stairs             Wheelchair Mobility    Modified Rankin (Stroke Patients Only)       Balance Overall balance assessment: Needs assistance Sitting-balance support: Single extremity supported Sitting balance-Leahy Scale: Normal     Standing balance support: Bilateral upper extremity supported Standing balance-Leahy Scale: Poor Standing balance comment: Poor balance likely due to L NWB status. Requires UE assistance to assist with transfers and initial standing at EOB         Cognition Arousal/Alertness: Awake/alert Behavior During Therapy: [facility] for tasks assessed/performed Overall Cognitive Status: Within Functional Limits for tasks assessed  General Comments: Decreased awareness of WB precautions      Exercises Other Exercises Other Exercises: Pt educated heavily on discharge recommendation and after conversation, pt able to identify barriers to discharge home and the role of rehabilitation. Pt educated on exercises that he can complete in the bed to maintain  muscle strength.    General Comments        Pertinent Vitals/Pain Pain Assessment: 0-10 Pain Score: 3  Pain Location: LLE Pain Descriptors / Indicators: Discomfort Pain Intervention(s): Limited activity within patient's tolerance;Monitored during session    Home Living          Prior Function            PT Goals (current goals can now be found in the care plan section) Acute Rehab PT Goals PT Goal Formulation: With patient/family Time For Goal Achievement: 01/25/21 Potential to Achieve Goals: Good Progress towards PT goals: Progressing toward goals    Frequency    Min 2X/week      PT Plan Current plan remains appropriate    Co-evaluation              AM-PAC PT "6 Clicks" Mobility   Outcome Measure  Help needed turning from your back to your side while in a flat bed without using bedrails?: None Help needed moving from lying on your back to sitting on the side of a flat bed without using bedrails?: None Help needed moving to and from a bed to a chair (including a wheelchair)?: A Little Help needed standing up from a chair using your arms (e.g., wheelchair or bedside chair)?: A Lot Help needed to walk in hospital room?: Total Help needed climbing 3-5 steps with a railing? : Total 6 Click Score: 15    End of Session Equipment Utilized During Treatment: Gait belt Activity Tolerance: Patient tolerated treatment well Patient left: in bed;with call bell/phone within reach;with bed alarm set Nurse Communication: Mobility status PT Visit Diagnosis: Muscle weakness (generalized) (M62.81);Other abnormalities of gait and mobility (R26.89);Pain Pain - Right/Left: Left Pain - part of body: Ankle and joints of foot     Time: [id number] PT Time Calculation (min) (ACUTE ONLY): 39 min  Charges:  $Therapeutic Activity: 38-52 mins                     Andrey Campanile, SPT    Andrey Campanile 01/13/2021, 1:05 PM

## 2021-01-13 NOTE — NC FL2 (Signed)
[place] LEVEL OF CARE SCREENING TOOL     IDENTIFICATION  Patient Name: [name]. Birthdate: 1955/10/29 Sex: male Admission Date (Current Location): 01/03/2021  [facility] and [place] Number:  Engineering geologist and Address:  [facility], [address] [place], [place] [address]      Provider Number: [id number]  Attending Physician Name and Address:  Bonnielee Haff, MD  Relative Name and Phone Number:       Current Level of Care: Hospital Recommended Level of Care: [place] Prior Approval Number:    Date Approved/Denied:   PASRR Number: [id number]  Discharge Plan: SNF    Current Diagnoses: Patient Active Problem List   Diagnosis Date Noted   Sepsis due to cellulitis ([place]) 01/03/2021   Chronic osteomyelitis of left foot ([place]) 01/03/2021   Osteomyelitis ([place]) 01/03/2021   Severe sepsis ([place]) 02/24/2020   AKI (acute kidney injury) ([place]) 02/24/2020   Cellulitis of left lower extremity 02/23/2020   CAD (coronary artery disease) 02/02/2020   RLS (restless legs syndrome) 05/15/2019   Statin myopathy 05/15/2019   Diabetes mellitus type 2 in obese ([place]) 02/13/2016   Essential hypertension, benign 02/13/2016   PAD (peripheral artery disease) ([place]) 02/13/2016   Pressure injury of skin 01/25/2016   Hypoglycemia associated with diabetes ([place]) 12/27/2013   Long-term insulin use ([place]) 12/27/2013   Microalbuminuria 12/27/2013   B12 deficiency 09/29/2013   Obesity 09/29/2013   CAD (coronary artery disease), autologous vein bypass graft 07/20/2013   Hypersomnia with sleep apnea 07/20/2013   Pure hypercholesterolemia 07/20/2013   Osteomyelitis of ankle or foot 07/20/2011    Orientation RESPIRATION BLADDER Height & Weight     Self, Time, Situation, Place  O2, Other (Comment) (Nasal Canula 2.5 L. Uses CPAP QHS at home.) Continent Weight: (!) 376 lb 12.3 oz (170.9 kg) Height:  '6\' 4"'$  (193 cm)  BEHAVIORAL  SYMPTOMS/MOOD NEUROLOGICAL BOWEL NUTRITION STATUS   (None)  (None) Continent Diet (Heart healthy/carb modified)  AMBULATORY STATUS COMMUNICATION OF NEEDS Skin   Limited Assist Verbally Skin abrasions, Bruising, Surgical wounds, Other (Comment) (Incision on foot (ace wrap). Wound on left foot (gauze, ace wrap).)                       Personal Care Assistance Level of Assistance  Bathing, Feeding, Dressing Bathing Assistance: Limited assistance Feeding assistance: Independent Dressing Assistance: Limited assistance     Functional Limitations Info  Sight, Hearing, Speech Sight Info: Adequate Hearing Info: Adequate Speech Info: Adequate    SPECIAL CARE FACTORS FREQUENCY  PT (By licensed PT), OT (By licensed OT)     PT Frequency: 5 x week OT Frequency: 5 x week            Contractures Contractures Info: Not present    Additional Factors Info  Code Status, Allergies Code Status Info: Full code Allergies Info: Statins           Current Medications (01/13/2021):  This is the current hospital active medication list Current Facility-Administered Medications  Medication Dose Route Frequency Provider Last Rate Last Admin   acetaminophen (TYLENOL) tablet 650 mg  650 mg Oral Q6H PRN Caroline More, DPM   650 mg at 01/12/21 1646   Or   acetaminophen (TYLENOL) suppository 650 mg  650 mg Rectal Q6H PRN Caroline More, DPM       amiodarone (PACERONE) tablet 200 mg  200 mg Oral Daily Callwood, Loran Senters, MD  200 mg at 01/13/21 0920   Ampicillin-Sulbactam (UNASYN) 3 g in sodium chloride 0.9 % 100 mL IVPB  3 g Intravenous Q6H Caroline More, DPM 200 mL/hr at 01/13/21 1201 3 g at 01/13/21 1201   apixaban (ELIQUIS) tablet 5 mg  5 mg Oral BID Oswald Hillock, RPH   5 mg at 01/13/21 0920   atorvastatin (LIPITOR) tablet 20 mg  20 mg Oral QHS Caroline More, DPM   20 mg at 01/12/21 2301   clopidogrel (PLAVIX) tablet 75 mg  75 mg Oral Daily Caroline More, DPM   75 mg at 01/13/21 0920    digoxin (LANOXIN) tablet 0.0625 mg  0.0625 mg Oral Daily Callwood, Dwayne D, MD   0.0625 mg at 01/13/21 0920   diltiazem (CARDIZEM) tablet 120 mg  120 mg Oral Q8H Callwood, Dwayne D, MD   120 mg at 01/13/21 0523   ezetimibe (ZETIA) tablet 10 mg  10 mg Oral QHS Caroline More, DPM   10 mg at 01/12/21 2301   insulin aspart (novoLOG) injection 0-15 Units  0-15 Units Subcutaneous TID WC Caroline More, DPM   5 Units at 01/13/21 1200   insulin aspart (novoLOG) injection 0-5 Units  0-5 Units Subcutaneous QHS Caroline More, DPM   3 Units at 01/12/21 2302   insulin aspart (novoLOG) injection 9 Units  9 Units Subcutaneous TID WC Bonnielee Haff, MD   9 Units at 01/13/21 1200   insulin glargine-yfgn (SEMGLEE) injection 42 Units  42 Units Subcutaneous BID Bonnielee Haff, MD   42 Units at 01/13/21 0921   metoprolol tartrate (LOPRESSOR) injection 5 mg  5 mg Intravenous Q8H PRN Caroline More, DPM       metoprolol tartrate (LOPRESSOR) tablet 50 mg  50 mg Oral BID Caroline More, DPM   50 mg at 01/13/21 0920   ondansetron (ZOFRAN) tablet 4 mg  4 mg Oral Q6H PRN Caroline More, DPM       Or   ondansetron (ZOFRAN) injection 4 mg  4 mg Intravenous Q6H PRN Caroline More, DPM       oxyCODONE (Oxy IR/ROXICODONE) immediate release tablet 5 mg  5 mg Oral Q4H PRN Bonnielee Haff, MD   5 mg at 01/13/21 0932   pramipexole (MIRAPEX) tablet 2 mg  2 mg Oral QHS Caroline More, DPM   2 mg at 01/12/21 2311   primidone (MYSOLINE) tablet 250 mg  250 mg Oral BID Caroline More, DPM   250 mg at 01/13/21 0920   tamsulosin (FLOMAX) capsule 0.4 mg  0.4 mg Oral Daily Caroline More, DPM   0.4 mg at 01/13/21 0920   Facility-Administered Medications Ordered in Other Encounters  Medication Dose Route Frequency Provider Last Rate Last Admin   sodium chloride flush (NS) 0.9 % injection 3 mL  3 mL Intravenous Q12H Teodoro Spray, MD         Discharge Medications: Please see discharge summary for a list of discharge medications.  Relevant  Imaging Results:  Relevant Lab Results:   Additional Information SS#: [id number]. No COVID vaccines.  Candie Chroman, LCSW

## 2021-01-13 NOTE — Progress Notes (Signed)
[facility] PROGRESS NOTE   [name]. [id number] DOB: Jan 27, 1956 DOA: 01/03/2021  PCP: [name], MD  Brief History/Interval Summary: 65 year old male with history of hypertension, congestive heart failure, hyperlipidemia, diabetes type 2, chronic ulcer on the left foot presented to the emergency department with complaints of fever, chills.  On presentation he was febrile with temperature of 104, he was tachycardic.  He was found to have worsening ulcer of the left foot.  Patient was hospitalized.  Seen by podiatry and underwent ray amputation.  He also developed atrial fibrillation with RVR during this hospital stay.  Consultants: Podiatry.  Cardiology  Procedures: Ray amputation left fifth metatarsal.  Tendo Achilles lengthening on the left.  Wound debridement left plantar fifth metatarsal phalangeal joint  Antibiotics: Anti-infectives (From admission, onward)    Start     Dose/Rate Route Frequency Ordered Stop   01/06/21 1230  Ampicillin-Sulbactam (UNASYN) 3 g in sodium chloride 0.9 % 100 mL IVPB        3 g 200 mL/hr over 30 Minutes Intravenous Every 6 hours 01/06/21 1130     01/05/21 0600  ceFAZolin (ANCEF) IVPB 2g/100 mL premix        2 g 200 mL/hr over 30 Minutes Intravenous On call to O.R. 01/04/21 1711 01/05/21 0529   01/04/21 1400  cefTRIAXone (ROCEPHIN) 2 g in sodium chloride 0.9 % 100 mL IVPB  Status:  Discontinued        2 g 200 mL/hr over 30 Minutes Intravenous Every 24 hours 01/04/21 0853 01/06/21 1130   01/04/21 1000  vancomycin (VANCOREADY) IVPB 1750 mg/350 mL  Status:  Discontinued        1,750 mg 175 mL/hr over 120 Minutes Intravenous Every 12 hours 01/03/21 2154 01/04/21 0757   01/04/21 1000  vancomycin (VANCOREADY) IVPB 1500 mg/300 mL  Status:  Discontinued        1,500 mg 150 mL/hr over 120 Minutes Intravenous Every 12 hours 01/04/21 0757 01/04/21 0853   01/03/21 2200  piperacillin-tazobactam (ZOSYN) IVPB 3.375 g  Status:   Discontinued        3.375 g 12.5 mL/hr over 240 Minutes Intravenous Every 8 hours 01/03/21 2154 01/04/21 0853   01/03/21 2100  vancomycin (VANCOREADY) IVPB 500 mg/100 mL        500 mg 100 mL/hr over 60 Minutes Intravenous  Once 01/03/21 1848 01/04/21 0019   01/03/21 1900  ceFEPIme (MAXIPIME) 2 g in sodium chloride 0.9 % 100 mL IVPB        2 g 200 mL/hr over 30 Minutes Intravenous  Once 01/03/21 1845 01/03/21 1925   01/03/21 1845  metroNIDAZOLE (FLAGYL) IVPB 500 mg  Status:  Discontinued        500 mg 100 mL/hr over 60 Minutes Intravenous Every 8 hours 01/03/21 1841 01/04/21 0853   01/03/21 1845  vancomycin (VANCOREADY) IVPB 2000 mg/400 mL        2,000 mg 200 mL/hr over 120 Minutes Intravenous  Once 01/03/21 1845 01/03/21 2248       Subjective/Interval History: Patient mentions that pain in the left leg is reasonably well controlled.  Denies any nausea vomiting.  Noted to be on oxygen this morning.  He mentions that his levels were low earlier today.   Assessment/Plan:  Left foot cellulitis with chronic ulcer/streptococcal bacteremia Presented with worsening left foot ulcer.  MRI did not show any signs of osteomyelitis but showed extensive cellulitis and myositis.   Blood cultures positive for group B streptococcus.  Infectious disease was consulted.  Patient currently on Unasyn. Patient underwent amputation of the affected area on 9/13.  He is nonweightbearing on the left lower extremity.  Seen by PT and OT and SNF is recommended for rehabilitation. Transthoracic echocardiogram did not suggest any vegetation. Patient was also seen by vascular surgery during this hospitalization and underwent left lower extremity angiogram.  No significant stenosis was noted.  Patient seems to be stable.  Afebrile.  Pain is well controlled.  Hypoxia Noted to be on oxygen this morning.  Apparently was 85% on room air at rest.  No abnormal auscultation findings on examination.  He denies any shortness  of breath or chest pain.  We will do a chest x-ray.  Incentive spirometry.  He does have a history of sleep apnea and uses CPAP at night.  Perhaps oxygen saturations were checked when he was asleep.  Atrial fibrillation with RVR This is apparently a new diagnosis for him.  He was on apixaban prior to admission but was on it for peripheral artery disease.  Cardiology has been following him here in the hospital.  Was on a amiodarone infusion which has been changed over to oral.  He was also on diltiazem infusion.  Changed over to oral diltiazem.  Heart rate seems to be stable although still going up to 120 at times.  Remains on digoxin and metoprolol as well.  Remains on Eliquis.  Eliquis was held briefly for surgery.   Diabetes mellitus type 2, uncontrolled with hyperglycemia Noted to be on SSI and glargine. HbA1c 8.9.   Prior to admission patient was on metformin Ozempic Lantus NovoLog. Glargine dose was adjusted.  CBGs seem to be slightly better.  Will make further adjustments tomorrow depending on trend.    Coronary artery disease Stable.  Continue home medications.  Essential hypertension Monitor blood pressures closely.  Blood pressure is reasonably well controlled.  History of BPH Continue Flomax  Morbid obesity Estimated body mass index is 45.86 kg/m as calculated from the following:   Height as of this encounter: '6\' 4"'$  (1.93 m).   Weight as of this encounter: 170.9 kg.    DVT Prophylaxis: Apixaban Code Status: Full code Family Communication: Discussed with patient.  No family at bedside. Disposition Plan: Seen by PT.  SNF is recommended.  Status is: Inpatient  Remains inpatient appropriate because:IV treatments appropriate due to intensity of illness or inability to take PO and Inpatient level of care appropriate due to severity of illness  Dispo: The patient is from: Home              Anticipated d/c is to:  To be determined              Patient currently is not  medically stable to d/c.   Difficult to place patient No      Medications: Scheduled:  amiodarone  200 mg Oral Daily   apixaban  5 mg Oral BID   atorvastatin  20 mg Oral QHS   clopidogrel  75 mg Oral Daily   digoxin  0.0625 mg Oral Daily   diltiazem  120 mg Oral Q8H   ezetimibe  10 mg Oral QHS   insulin aspart  0-15 Units Subcutaneous TID WC   insulin aspart  0-5 Units Subcutaneous QHS   insulin aspart  9 Units Subcutaneous TID WC   insulin glargine-yfgn  42 Units Subcutaneous BID   metoprolol tartrate  50 mg Oral BID   pramipexole  2 mg Oral  QHS   primidone  250 mg Oral BID   tamsulosin  0.4 mg Oral Daily   Continuous:  ampicillin-sulbactam (UNASYN) IV 3 g (01/13/21 1201)   [id number] **OR** acetaminophen, metoprolol tartrate, ondansetron **OR** ondansetron (ZOFRAN) IV, oxyCODONE   Objective:  Vital Signs  Vitals:   01/12/21 2300 01/13/21 0040 01/13/21 0510 01/13/21 0933  BP: (!) 125/91 120/86 124/83 139/89  Pulse:    (!) 109  Resp:  (!) 22 18   Temp:  97.8 F (36.6 C) 97.7 F (36.5 C)   TempSrc:  Oral Oral   SpO2:  96%  94%  Weight:      Height:        Intake/Output Summary (Last 24 hours) at 01/13/2021 1310 Last data filed at 01/13/2021 0945 Gross per 24 hour  Intake 3359.24 ml  Output 1950 ml  Net 1409.24 ml    Filed Weights   01/09/21 0547 01/10/21 1611 01/12/21 0500  Weight: (!) 162.8 kg (!) 162.8 kg (!) 170.9 kg    General appearance: Awake alert.  In no distress Resp: Diminished air entry at the bases.  Normal effort.  No wheezing or rhonchi.  No definite crackles. Cardio: S1-S2 is irregularly irregular.  Telemetry shows heart rate varying between 80 to 120. GI: Abdomen is soft.  Nontender nondistended.  Bowel sounds are present normal.  No masses organomegaly Extremities: Left lower extremity is in a cast. Neurologic: Alert and oriented x3.  No focal neurological deficits.      Lab Results:  Data Reviewed: I have personally  reviewed following labs and imaging studies  CBC: Recent Labs  Lab 01/08/21 0533 01/12/21 0922  WBC 7.7 8.3  HGB 13.5 13.1  HCT 40.2 40.3  MCV 92.8 92.6  PLT 153 246     Basic Metabolic Panel: Recent Labs  Lab 01/08/21 0533 01/12/21 0922  NA 136 137  K 4.0 4.3  CL 103 102  CO2 26 28  GLUCOSE 289* 230*  BUN 27* 15  CREATININE 0.95 0.75  CALCIUM 7.6* 8.1*     GFR: Estimated Creatinine Clearance: 156.8 mL/min (by C-G formula based on SCr of 0.75 mg/dL).     CBG: Recent Labs  Lab 01/12/21 1142 01/12/21 1625 01/12/21 2107 01/13/21 0853 01/13/21 1134  GLUCAP 263* 260* 278* 185* 205*      Recent Results (from the past 240 hour(s))  Blood culture (routine single)     Status: Abnormal   Collection Time: 01/03/21  5:37 PM   Specimen: BLOOD  Result Value Ref Range Status   Specimen Description   Final    BLOOD BLOOD RIGHT FOREARM Performed at [facility], [address], [place], [place] [address]    Special Requests   Final    BOTTLES DRAWN AEROBIC AND ANAEROBIC Blood Culture adequate volume Performed at [facility], [address] [place], [place] [address]    Culture  Setup Time   Final    GRAM POSITIVE COCCI AEROBIC BOTTLE ONLY CRITICAL RESULT CALLED TO, READ BACK BY AND VERIFIED WITH: SAMANTHA RAUER 01/04/21 0749 KLW GRAM POSITIVE RODS ANAEROBIC BOTTLE ONLY CRITICAL RESULT CALLED TO, READ BACK BY AND VERIFIED WITH: BRANDON BEERS 01/08/21 @ 1110 BY SB Performed at [facility], [address] [place], [place] [address]    Culture (A)  Final    STREPTOCOCCUS GROUP G PROPIONIBACTERIUM ACNES Standardized susceptibility testing for this organism is not available. Performed at [facility], [place] [address] [place], [place] [address]  Report Status 01/10/2021 FINAL  Final   Organism ID, Bacteria STREPTOCOCCUS GROUP G  Final      Susceptibility   Streptococcus group g - MIC*    CLINDAMYCIN <=0.25 SENSITIVE  Sensitive     AMPICILLIN <=0.25 SENSITIVE Sensitive     ERYTHROMYCIN <=0.12 SENSITIVE Sensitive     VANCOMYCIN 0.5 SENSITIVE Sensitive     CEFTRIAXONE <=0.12 SENSITIVE Sensitive     LEVOFLOXACIN 0.5 SENSITIVE Sensitive     PENICILLIN Value in next row Sensitive      SENSITIVEMIC =0.06S    * STREPTOCOCCUS GROUP G  Blood Culture ID Panel (Reflexed)     Status: Abnormal   Collection Time: 01/03/21  5:37 PM  Result Value Ref Range Status   Enterococcus faecalis NOT DETECTED NOT DETECTED Final   Enterococcus Faecium NOT DETECTED NOT DETECTED Final   Listeria monocytogenes NOT DETECTED NOT DETECTED Final   Staphylococcus species NOT DETECTED NOT DETECTED Final   Staphylococcus aureus (BCID) NOT DETECTED NOT DETECTED Final   Staphylococcus epidermidis NOT DETECTED NOT DETECTED Final   Staphylococcus lugdunensis NOT DETECTED NOT DETECTED Final   Streptococcus species DETECTED (A) NOT DETECTED Final    Comment: Not Enterococcus species, Streptococcus agalactiae, Streptococcus pyogenes, or Streptococcus pneumoniae. CRITICAL RESULT CALLED TO, READ BACK BY AND VERIFIED WITH: SAMANTHA RAUER 01/04/21 0749 KLW    Streptococcus agalactiae NOT DETECTED NOT DETECTED Final   Streptococcus pneumoniae NOT DETECTED NOT DETECTED Final   Streptococcus pyogenes NOT DETECTED NOT DETECTED Final   A.calcoaceticus-baumannii NOT DETECTED NOT DETECTED Final   Bacteroides fragilis NOT DETECTED NOT DETECTED Final   Enterobacterales NOT DETECTED NOT DETECTED Final   Enterobacter cloacae complex NOT DETECTED NOT DETECTED Final   Escherichia coli NOT DETECTED NOT DETECTED Final   Klebsiella aerogenes NOT DETECTED NOT DETECTED Final   Klebsiella oxytoca NOT DETECTED NOT DETECTED Final   Klebsiella pneumoniae NOT DETECTED NOT DETECTED Final   Proteus species NOT DETECTED NOT DETECTED Final   Salmonella species NOT DETECTED NOT DETECTED Final   Serratia marcescens NOT DETECTED NOT DETECTED Final   Haemophilus  influenzae NOT DETECTED NOT DETECTED Final   Neisseria meningitidis NOT DETECTED NOT DETECTED Final   Pseudomonas aeruginosa NOT DETECTED NOT DETECTED Final   Stenotrophomonas maltophilia NOT DETECTED NOT DETECTED Final   Candida albicans NOT DETECTED NOT DETECTED Final   Candida auris NOT DETECTED NOT DETECTED Final   Candida glabrata NOT DETECTED NOT DETECTED Final   Candida krusei NOT DETECTED NOT DETECTED Final   Candida parapsilosis NOT DETECTED NOT DETECTED Final   Candida tropicalis NOT DETECTED NOT DETECTED Final   Cryptococcus neoformans/gattii NOT DETECTED NOT DETECTED Final    Comment: Performed at [facility], [address] [place], [place] [address]  Resp Panel by RT-PCR (Flu A&B, Covid) Nasopharyngeal Swab     Status: None   Collection Time: 01/03/21  6:24 PM   Specimen: Nasopharyngeal Swab; Nasopharyngeal(NP) swabs in vial transport medium  Result Value Ref Range Status   SARS Coronavirus 2 by RT PCR NEGATIVE NEGATIVE Final    Comment: (NOTE) SARS-CoV-2 target nucleic acids are NOT DETECTED.  The SARS-CoV-2 RNA is generally detectable in upper respiratory specimens during the acute phase of infection. The lowest concentration of SARS-CoV-2 viral copies this assay can detect is 138 copies/mL. A negative result does not preclude SARS-Cov-2 infection and should not be used as the sole basis for treatment or other patient management decisions. A negative result may occur with  improper specimen collection/handling,  submission of specimen other than nasopharyngeal swab, presence of viral mutation(s) within the areas targeted by this assay, and inadequate number of viral copies(<138 copies/mL). A negative result must be combined with clinical observations, patient history, and epidemiological information. The expected result is Negative.  Fact Sheet for Patients:  EntrepreneurPulse.com.au  Fact Sheet for Healthcare Providers:   IncredibleEmployment.be  This test is no t yet approved or cleared by the [place] FDA and  has been authorized for detection and/or diagnosis of SARS-CoV-2 by FDA under an Emergency Use Authorization (EUA). This EUA will remain  in effect (meaning this test can be used) for the duration of the COVID-19 declaration under Section 564(b)(1) of the Act, 21 U.S.C.section 360bbb-3(b)(1), unless the authorization is terminated  or revoked sooner.       Influenza A by PCR NEGATIVE NEGATIVE Final   Influenza B by PCR NEGATIVE NEGATIVE Final    Comment: (NOTE) The Xpert Xpress SARS-CoV-2/FLU/RSV plus assay is intended as an aid in the diagnosis of influenza from Nasopharyngeal swab specimens and should not be used as a sole basis for treatment. Nasal washings and aspirates are unacceptable for Xpert Xpress SARS-CoV-2/FLU/RSV testing.  Fact Sheet for Patients: EntrepreneurPulse.com.au  Fact Sheet for Healthcare Providers: IncredibleEmployment.be  This test is not yet approved or cleared by the [place] FDA and has been authorized for detection and/or diagnosis of SARS-CoV-2 by FDA under an Emergency Use Authorization (EUA). This EUA will remain in effect (meaning this test can be used) for the duration of the COVID-19 declaration under Section 564(b)(1) of the Act, 21 U.S.C. section 360bbb-3(b)(1), unless the authorization is terminated or revoked.  Performed at [facility] [place], [address] [place], [place] [address]   Blood culture (single)     Status: Abnormal   Collection Time: 01/03/21  7:15 PM   Specimen: BLOOD  Result Value Ref Range Status   Specimen Description   Final    BLOOD BLOOD LEFT HAND Performed at [facility], [address] [place], [place] [address]    Special Requests   Final    BOTTLES DRAWN AEROBIC AND ANAEROBIC Blood Culture results may not be optimal due to an excessive  volume of blood received in culture bottles Performed at [facility], [address] [place], [place] [address]    Culture  Setup Time   Final    GRAM POSITIVE COCCI ANAEROBIC BOTTLE ONLY CRITICAL RESULT CALLED TO, READ BACK BY AND VERIFIED WITH: Coralie Carpen 01/04/21 [place] Performed at [facility], [address] [place], [place] [address]    Culture (A)  Final    STREPTOCOCCUS GROUP G SUSCEPTIBILITIES PERFORMED ON PREVIOUS CULTURE WITHIN THE LAST 5 DAYS. Performed at [facility], [place] [address] [place], [place] [address]    Report Status 01/06/2021 FINAL  Final  MRSA Next Gen by PCR, Nasal     Status: None   Collection Time: 01/04/21  7:52 AM   Specimen: Nasal Mucosa; Nasal Swab  Result Value Ref Range Status   MRSA by PCR Next Gen NOT DETECTED NOT DETECTED Final    Comment: (NOTE) The GeneXpert MRSA Assay (FDA approved for NASAL specimens only), is one component of a comprehensive MRSA colonization surveillance program. It is not intended to diagnose MRSA infection nor to guide or monitor treatment for MRSA infections. Test performance is not FDA approved in patients less than 5 years old. Performed at [facility], [place], [place]  [address]   Aerobic/Anaerobic Culture w Gram Stain (surgical/deep wound)     Status: None   Collection Time: 01/04/21 12:39 PM   Specimen: Foot; Wound  Result Value Ref Range Status   Specimen Description   Final    FOOT LEFT Performed at [facility], [address] [place], [place] [address]    Special Requests   Final    NONE Performed at [facility], [address] [place], [place] [address]    Gram Stain   Final    FEW SQUAMOUS EPITHELIAL CELLS PRESENT FEW WBC SEEN MODERATE GRAM POSITIVE COCCI FEW GRAM POSITIVE RODS    Culture   Final    RARE STAPHYLOCOCCUS AUREUS WITHIN MIXED ORGANISMS NO ANAEROBES ISOLATED Performed at [facility],  [place] [address], [place], [place] [address]    Report Status 01/10/2021 FINAL  Final   Organism ID, Bacteria STAPHYLOCOCCUS AUREUS  Final      Susceptibility   Staphylococcus aureus - MIC*    CIPROFLOXACIN <=0.5 SENSITIVE Sensitive     ERYTHROMYCIN RESISTANT Resistant     GENTAMICIN <=0.5 SENSITIVE Sensitive     OXACILLIN 0.5 SENSITIVE Sensitive     TETRACYCLINE <=1 SENSITIVE Sensitive     VANCOMYCIN <=0.5 SENSITIVE Sensitive     TRIMETH/SULFA 80 RESISTANT Resistant     CLINDAMYCIN RESISTANT Resistant     RIFAMPIN <=0.5 SENSITIVE Sensitive     Inducible Clindamycin POSITIVE Resistant     * RARE STAPHYLOCOCCUS AUREUS  CULTURE, BLOOD (ROUTINE X 2) w Reflex to ID Panel     Status: None   Collection Time: 01/06/21 12:23 AM   Specimen: BLOOD  Result Value Ref Range Status   Specimen Description BLOOD RIGHT ASSIST CONTROL  Final   Special Requests   Final    BOTTLES DRAWN AEROBIC AND ANAEROBIC Blood Culture adequate volume   Culture   Final    NO GROWTH 5 DAYS Performed at [facility], [address] [place], [place] [address]    Report Status 01/11/2021 FINAL  Final  CULTURE, BLOOD (ROUTINE X 2) w Reflex to ID Panel     Status: None   Collection Time: 01/06/21 12:25 AM   Specimen: BLOOD  Result Value Ref Range Status   Specimen Description BLOOD LEFT ASSIST CONTROL  Final   Special Requests   Final    BOTTLES DRAWN AEROBIC ONLY Blood Culture adequate volume   Culture   Final    NO GROWTH 5 DAYS Performed at [facility], [address] [place], [place] [address]    Report Status 01/11/2021 FINAL  Final       Radiology Studies: No results found.     LOS: 10 days   Elissia Spiewak [employer] on [email or web address]  01/13/2021, 1:10 PM

## 2021-01-13 NOTE — TOC Initial Note (Signed)
Transition of Care (TOC) - Initial/Assessment Note    Patient Details  Name: [name]. MRN: [id number] Date of Birth: 1955-07-23  Transition of Care [facility]) CM/SW Contact:    Candie Chroman, LCSW Phone Number: 01/13/2021, 2:31 PM  Clinical Narrative:  CSW met with patient. No supports at bedside. CSW introduced role and explained that PT recommendations would be discussed. Patient is considering SNF vs home health. He will discuss with his wife. He gave permission for CSW to go ahead and send out SNF referral to see what his options are. Gave CMS scores for facilities within 25 miles of his zip code. Patient does not have COVID vaccines. No further concerns. CSW encouraged patient to contact CSW as needed. CSW will continue to follow patient for support and facilitate discharge to SNF vs home when stable.                Expected Discharge Plan: [place] (vs home health) Barriers to Discharge: Continued Medical Work up   Patient Goals and CMS Choice   CMS Medicare.gov Compare Post Acute Care list provided to:: Patient    Expected Discharge Plan and Services Expected Discharge Plan: [place] (vs home health)     Post Acute Care Choice: [place] arrangements for the past 2 months: [place]                                      Prior Living Arrangements/Services Living arrangements for the past 2 months: Single Family Home Lives with:: Spouse Patient language and need for interpreter reviewed:: Yes Do you feel safe going back to the place where you live?: Yes      Need for Family Participation in Patient Care: Yes (Comment) Care giver support system in place?: Yes (comment)   Criminal Activity/Legal Involvement Pertinent to Current Situation/Hospitalization: No - Comment as needed  Activities of Daily Living Home Assistive Devices/Equipment: None ADL Screening (condition at time of  admission) Patient's cognitive ability adequate to safely complete daily activities?: Yes Is the patient deaf or have difficulty hearing?: No Does the patient have difficulty seeing, even when wearing glasses/contacts?: Yes Does the patient have difficulty concentrating, remembering, or making decisions?: No Patient able to express need for assistance with ADLs?: No Does the patient have difficulty dressing or bathing?: No Independently performs ADLs?: Yes (appropriate for developmental age) Does the patient have difficulty walking or climbing stairs?: Yes Weakness of Legs: None Weakness of Arms/Hands: None  Permission Sought/Granted Permission sought to share information with : Facility Art therapist granted to share information with : Yes, Verbal Permission Granted     Permission granted to share info w AGENCY: SNF's        Emotional Assessment Appearance:: Appears stated age Attitude/Demeanor/Rapport: Engaged, Gracious Affect (typically observed): Accepting, Appropriate, Calm, Pleasant Orientation: : Oriented to Self, Oriented to Place, Oriented to  Time, Oriented to Situation Alcohol / Substance Use: Not Applicable Psych Involvement: No (comment)  Admission diagnosis:  Osteomyelitis ([place]) [M86.9] Sepsis due to cellulitis ([place]) [L03.90, A41.9] Patient Active Problem List   Diagnosis Date Noted   Sepsis due to cellulitis ([place]) 01/03/2021   Chronic osteomyelitis of left foot ([place]) 01/03/2021   Osteomyelitis ([place]) 01/03/2021   Severe sepsis ([place]) 02/24/2020   AKI (acute kidney injury) ([place]) 02/24/2020   Cellulitis of left lower extremity 02/23/2020  CAD (coronary artery disease) 02/02/2020   RLS (restless legs syndrome) 05/15/2019   Statin myopathy 05/15/2019   Diabetes mellitus type 2 in obese ([place]) 02/13/2016   Essential hypertension, benign 02/13/2016   PAD (peripheral artery disease) ([place]) 02/13/2016   Pressure injury of skin 01/25/2016    Hypoglycemia associated with diabetes ([place]) 12/27/2013   Long-term insulin use ([place]) 12/27/2013   Microalbuminuria 12/27/2013   B12 deficiency 09/29/2013   Obesity 09/29/2013   CAD (coronary artery disease), autologous vein bypass graft 07/20/2013   Hypersomnia with sleep apnea 07/20/2013   Pure hypercholesterolemia 07/20/2013   Osteomyelitis of ankle or foot 07/20/2011   PCP:  [name], MD Pharmacy:   [facility], [place] [address] [address] Phone: [phone] Fax: [place], [place] [address] Phone: [phone] Fax: [phone]     Social Determinants of Health (SDOH) Interventions    Readmission Risk Interventions No flowsheet data found.

## 2021-01-13 NOTE — Progress Notes (Signed)
Inpatient Diabetes Program Recommendations  AACE/ADA: New Consensus Statement on Inpatient Glycemic Control (2015)  Target Ranges:  Prepandial:   less than 140 mg/dL      Peak postprandial:   less than 180 mg/dL (1-2 hours)      Critically ill patients:  140 - 180 mg/dL   Lab Results  Component Value Date   GLUCAP 205 (H) 01/13/2021   HGBA1C 8.9 (H) 01/04/2021    Review of Glycemic Control Results for Sweis, IBRAHIM KNIPE" (MRN [id number]) as of 01/13/2021 12:20  Ref. Range 01/12/2021 16:25 01/12/2021 21:07 01/13/2021 08:53 01/13/2021 11:34  Glucose-Capillary Latest Ref Range: 70 - 99 mg/dL 260 (H) 278 (H) 185 (H) 205 (H)   Diabetes history: DM2 Outpatient Diabetes medications: Lantus 25 units QHS, Novolog 18 units TID with meals, Metformin 1000 mg BID, Ozempic 1 mg Qweek Current orders for Inpatient glycemic control: Semglee 42 units BID, Novolog 9 units TID with meals, Novolog 0-15 units TID with meals, Novolog 0-5 units QHS   Inpatient Diabetes Program Recommendations  Consider increasing Novolog 12 units TID (Assuming patient consuming >50% of meals) and Semglee to 44 units BID.   Thanks, Bronson Curb, MSN, RNC-OB Diabetes Coordinator [phone] (8a-5p)

## 2021-01-13 NOTE — Progress Notes (Signed)
SATURATION QUALIFICATIONS: (This note is used to comply with regulatory documentation for home oxygen)  Patient Saturations on Room Air at Rest = 85%   

## 2021-01-14 DIAGNOSIS — L0390 Cellulitis, unspecified: Secondary | ICD-10-CM | POA: Diagnosis not present

## 2021-01-14 DIAGNOSIS — I25810 Atherosclerosis of coronary artery bypass graft(s) without angina pectoris: Secondary | ICD-10-CM | POA: Diagnosis not present

## 2021-01-14 DIAGNOSIS — E1169 Type 2 diabetes mellitus with other specified complication: Secondary | ICD-10-CM | POA: Diagnosis not present

## 2021-01-14 DIAGNOSIS — I4891 Unspecified atrial fibrillation: Secondary | ICD-10-CM | POA: Diagnosis not present

## 2021-01-14 LAB — BASIC METABOLIC PANEL
Anion gap: 4 — ABNORMAL LOW (ref 5–15)
BUN: 16 mg/dL (ref 8–23)
CO2: 31 mmol/L (ref 22–32)
Calcium: 7.9 mg/dL — ABNORMAL LOW (ref 8.9–10.3)
Chloride: 102 mmol/L (ref 98–111)
Creatinine, Ser: 0.9 mg/dL (ref 0.61–1.24)
GFR, Estimated: >60 mL/min (ref >60)
Glucose, Bld: 251 mg/dL — ABNORMAL HIGH (ref 70–99)
Potassium: 4.3 mmol/L (ref 3.5–5.1)
Sodium: 137 mmol/L (ref 135–145)

## 2021-01-14 LAB — CBC
HCT: 40.4 % (ref 39.0–52.0)
Hemoglobin: 12.5 g/dL — ABNORMAL LOW (ref 13.0–17.0)
MCH: 28.9 pg (ref 26.0–34.0)
MCHC: 30.9 g/dL (ref 30.0–36.0)
MCV: 93.3 fL (ref 80.0–100.0)
Platelets: 281 10*3/uL (ref 150–400)
RBC: 4.33 MIL/uL (ref 4.22–5.81)
RDW: 13.8 % (ref 11.5–15.5)
WBC: 9 10*3/uL (ref 4.0–10.5)
nRBC: 0 % (ref 0.0–0.2)

## 2021-01-14 LAB — GLUCOSE, CAPILLARY
Glucose-Capillary: 208 mg/dL — ABNORMAL HIGH (ref 70–99)
Glucose-Capillary: 328 mg/dL — ABNORMAL HIGH (ref 70–99)
Glucose-Capillary: 265 mg/dL — ABNORMAL HIGH (ref 70–99)
Glucose-Capillary: 353 mg/dL — ABNORMAL HIGH (ref 70–99)

## 2021-01-14 LAB — DIGOXIN LEVEL: Digoxin Level: 0.3 ng/mL — ABNORMAL LOW (ref 0.8–2.0)

## 2021-01-14 MED ORDER — FUROSEMIDE 20 MG PO TABS
20.0000 mg | ORAL_TABLET | Freq: Every day | ORAL | Status: DC
  Administered 2021-01-15 – 2021-01-16 (×2): 20 mg via ORAL
  Filled 2021-01-14 (×2): qty 1

## 2021-01-14 MED ORDER — GABAPENTIN 300 MG PO CAPS
300.0000 mg | ORAL_CAPSULE | Freq: Every day | ORAL | Status: DC
  Administered 2021-01-14 – 2021-01-19 (×6): 300 mg via ORAL
  Filled 2021-01-14 (×6): qty 1

## 2021-01-14 MED ORDER — DILTIAZEM HCL ER COATED BEADS 120 MG PO CP24
240.0000 mg | ORAL_CAPSULE | Freq: Once | ORAL | Status: AC
  Administered 2021-01-14: 240 mg via ORAL
  Filled 2021-01-14: qty 2

## 2021-01-14 MED ORDER — DILTIAZEM HCL ER COATED BEADS 180 MG PO CP24
300.0000 mg | ORAL_CAPSULE | Freq: Every day | ORAL | Status: DC
  Administered 2021-01-15 – 2021-01-19 (×5): 300 mg via ORAL
  Filled 2021-01-14 (×5): qty 1

## 2021-01-14 NOTE — Progress Notes (Signed)
[facility] PROGRESS NOTE   Alec A Tungate Jr. [id number] DOB: [birth year] DOA: 01/03/2021  PCP: [name], MD  Brief History/Interval Summary: 65 year old male with history of hypertension, congestive heart failure, hyperlipidemia, diabetes type 2, chronic ulcer on the left foot presented to the emergency department with complaints of fever, chills.  On presentation he was febrile with temperature of 104, he was tachycardic.  He was found to have worsening ulcer of the left foot.  Patient was hospitalized.  Seen by podiatry and underwent ray amputation.  He also developed atrial fibrillation with RVR during this hospital stay.  Consultants: Podiatry.  Cardiology  Procedures: Ray amputation left fifth metatarsal.  Tendo Achilles lengthening on the left.  Wound debridement left plantar fifth metatarsal phalangeal joint  Antibiotics: Anti-infectives (From admission, onward)    Start     Dose/Rate Route Frequency Ordered Stop   01/06/21 1230  Ampicillin-Sulbactam (UNASYN) 3 g in sodium chloride 0.9 % 100 mL IVPB        3 g 200 mL/hr over 30 Minutes Intravenous Every 6 hours 01/06/21 1130     01/05/21 0600  ceFAZolin (ANCEF) IVPB 2g/100 mL premix        2 g 200 mL/hr over 30 Minutes Intravenous On call to O.R. 01/04/21 1711 01/05/21 0529   01/04/21 1400  cefTRIAXone (ROCEPHIN) 2 g in sodium chloride 0.9 % 100 mL IVPB  Status:  Discontinued        2 g 200 mL/hr over 30 Minutes Intravenous Every 24 hours 01/04/21 0853 01/06/21 1130   01/04/21 1000  vancomycin (VANCOREADY) IVPB 1750 mg/350 mL  Status:  Discontinued        1,750 mg 175 mL/hr over 120 Minutes Intravenous Every 12 hours 01/03/21 2154 01/04/21 0757   01/04/21 1000  vancomycin (VANCOREADY) IVPB 1500 mg/300 mL  Status:  Discontinued        1,500 mg 150 mL/hr over 120 Minutes Intravenous Every 12 hours 01/04/21 0757 01/04/21 0853   01/03/21 2200  piperacillin-tazobactam (ZOSYN) IVPB 3.375 g  Status:   Discontinued        3.375 g 12.5 mL/hr over 240 Minutes Intravenous Every 8 hours 01/03/21 2154 01/04/21 0853   01/03/21 2100  vancomycin (VANCOREADY) IVPB 500 mg/100 mL        500 mg 100 mL/hr over 60 Minutes Intravenous  Once 01/03/21 1848 01/04/21 0019   01/03/21 1900  ceFEPIme (MAXIPIME) 2 g in sodium chloride 0.9 % 100 mL IVPB        2 g 200 mL/hr over 30 Minutes Intravenous  Once 01/03/21 1845 01/03/21 1925   01/03/21 1845  metroNIDAZOLE (FLAGYL) IVPB 500 mg  Status:  Discontinued        500 mg 100 mL/hr over 60 Minutes Intravenous Every 8 hours 01/03/21 1841 01/04/21 0853   01/03/21 1845  vancomycin (VANCOREADY) IVPB 2000 mg/400 mL        2,000 mg 200 mL/hr over 120 Minutes Intravenous  Once 01/03/21 1845 01/03/21 2248       Subjective/Interval History: Patient feels well.  Has not been using the hospital CPAP as the settings are not correct.  Has been using oxygen instead.  Denies any shortness of breath this morning.  Pain in the left lower leg is well controlled.     Assessment/Plan:  Left foot cellulitis with chronic ulcer/streptococcal bacteremia Presented with worsening left foot ulcer.  MRI did not show any signs of osteomyelitis but showed extensive cellulitis and myositis.  Blood cultures positive for group B streptococcus.  Infectious disease was consulted.  Patient currently on Unasyn. Patient underwent amputation of the affected area on 9/13.  He is nonweightbearing on the left lower extremity.  Seen by PT and OT and SNF is recommended for rehabilitation. Transthoracic echocardiogram did not suggest any vegetation. Patient was also seen by vascular surgery during this hospitalization and underwent left lower extremity angiogram.  No significant stenosis was noted.  Patient seems to be stable.  Afebrile.  Pain is well controlled. ID recommends transitioning to oral Augmentin on 9/19 and to continue that until 9/24.  Hypoxia probably due to sleep apnea Noted to  have a hypoxia yesterday morning which prompted application of oxygen.  Chest x-ray suggested some vascular congestion.  He was given a dose of furosemide.  Seems to have improved.  Lungs clear to auscultation today.  Sleep apnea is also contributing since patient has not been using the CPAP here in the hospital.  He also mentions that he takes Lasix at home.  Will resume his usual home dose.    Atrial fibrillation with RVR This is a new diagnosis for him.  He was on apixaban prior to admission but was on it for peripheral artery disease.  Cardiology has been following him here in the hospital.  Was on a amiodarone infusion which has been changed over to oral.  He was also on diltiazem infusion.  Changed over to oral diltiazem.   Heart rate seems to be reasonably well controlled.  We will change him to long-acting Cardizem.  He also remains on oral digoxin.  Also on metoprolol twice a day.   Anticoagulated with apixaban.  Diabetes mellitus type 2, uncontrolled with hyperglycemia Noted to be on SSI and glargine. HbA1c 8.9.   Prior to admission patient was on metformin Ozempic Lantus NovoLog. Remains on glargine 44 units twice a day.  Dose was adjusted yesterday.  Continue to monitor for now.  Will not make any adjustments today.  Also on meal coverage.  Coronary artery disease Stable.  Continue home medications.  Essential hypertension Monitor blood pressures closely.  Blood pressure is reasonably well controlled.  History of BPH Continue Flomax  Morbid obesity Estimated body mass index is 45.78 kg/m as calculated from the following:   Height as of this encounter: '6\' 4"'$  (1.93 m).   Weight as of this encounter: 170.6 kg.    DVT Prophylaxis: Apixaban Code Status: Full code Family Communication: Discussed with patient.  Wife is at the bedside. Disposition Plan: Seen by PT.  SNF is recommended.  Status is: Inpatient  Remains inpatient appropriate because:IV treatments appropriate due  to intensity of illness or inability to take PO and Inpatient level of care appropriate due to severity of illness  Dispo: The patient is from: Home              Anticipated d/c is to:  To be determined              Patient currently is not medically stable to d/c.   Difficult to place patient No      Medications: Scheduled:  amiodarone  200 mg Oral Daily   apixaban  5 mg Oral BID   atorvastatin  20 mg Oral QHS   clopidogrel  75 mg Oral Daily   digoxin  0.0625 mg Oral Daily   diltiazem  120 mg Oral Q8H   ezetimibe  10 mg Oral QHS   insulin aspart  0-15 Units Subcutaneous  TID WC   insulin aspart  0-5 Units Subcutaneous QHS   insulin aspart  12 Units Subcutaneous TID WC   insulin glargine-yfgn  44 Units Subcutaneous BID   metoprolol tartrate  50 mg Oral BID   pramipexole  2 mg Oral QHS   primidone  250 mg Oral BID   tamsulosin  0.4 mg Oral Daily   Continuous:  ampicillin-sulbactam (UNASYN) IV 3 g (01/14/21 0528)   [id number] **OR** acetaminophen, metoprolol tartrate, ondansetron **OR** ondansetron (ZOFRAN) IV, oxyCODONE   Objective:  Vital Signs  Vitals:   01/13/21 2152 01/14/21 0351 01/14/21 0500 01/14/21 0911  BP: 136/83 111/70  112/86  Pulse: 77 62  (!) 103  Resp: '18 18  18  '$ Temp: 98.1 F (36.7 C) 98.7 F (37.1 C)  98.2 F (36.8 C)  TempSrc:      SpO2: 97% 99%  93%  Weight:   (!) 170.6 kg   Height:        Intake/Output Summary (Last 24 hours) at 01/14/2021 1024 Last data filed at 01/14/2021 1015 Gross per 24 hour  Intake 720 ml  Output 600 ml  Net 120 ml    Filed Weights   01/10/21 1611 01/12/21 0500 01/14/21 0500  Weight: (!) 162.8 kg (!) 170.9 kg (!) 170.6 kg    General appearance: Awake alert.  In no distress Resp: Clear to auscultation bilaterally.  Normal effort Cardio: S1-S2 is irregularly irregular GI: Abdomen is soft.  Nontender nondistended.  Bowel sounds are present normal.  No masses organomegaly Extremities: Left lower extremity  is in a cast Neurologic: Alert and oriented x3.  No focal neurological deficits.      Lab Results:  Data Reviewed: I have personally reviewed following labs and imaging studies  CBC: Recent Labs  Lab 01/08/21 0533 01/12/21 0922 01/14/21 0411  WBC 7.7 8.3 9.0  HGB 13.5 13.1 12.5*  HCT 40.2 40.3 40.4  MCV 92.8 92.6 93.3  PLT 153 246 281     Basic Metabolic Panel: Recent Labs  Lab 01/08/21 0533 01/12/21 0922 01/14/21 0411  NA 136 137 137  K 4.0 4.3 4.3  CL 103 102 102  CO2 '26 28 31  '$ GLUCOSE 289* 230* 251*  BUN 27* 15 16  CREATININE 0.95 0.75 0.90  CALCIUM 7.6* 8.1* 7.9*     GFR: Estimated Creatinine Clearance: 139.2 mL/min (by C-G formula based on SCr of 0.9 mg/dL).     CBG: Recent Labs  Lab 01/13/21 0853 01/13/21 1134 01/13/21 1732 01/13/21 2155 01/14/21 0758  GLUCAP 185* 205* 145* 297* 208*      Recent Results (from the past 240 hour(s))  Aerobic/Anaerobic Culture w Gram Stain (surgical/deep wound)     Status: None   Collection Time: 01/04/21 12:39 PM   Specimen: Foot; Wound  Result Value Ref Range Status   Specimen Description   Final    FOOT LEFT Performed at [facility], [address] [place], [name] [address]    Special Requests   Final    NONE Performed at [facility], [address] [place], [place] [address]    Gram Stain   Final    FEW SQUAMOUS EPITHELIAL CELLS PRESENT FEW WBC SEEN MODERATE GRAM POSITIVE COCCI FEW GRAM POSITIVE RODS    Culture   Final    RARE STAPHYLOCOCCUS AUREUS WITHIN MIXED ORGANISMS NO ANAEROBES ISOLATED Performed at [facility], [place] [address] [place], [place] [address]    Report Status 01/10/2021 FINAL  Final  Organism ID, Bacteria STAPHYLOCOCCUS AUREUS  Final      Susceptibility   Staphylococcus aureus - MIC*    CIPROFLOXACIN <=0.5 SENSITIVE Sensitive     ERYTHROMYCIN RESISTANT Resistant     GENTAMICIN <=0.5 SENSITIVE Sensitive     OXACILLIN 0.5 SENSITIVE  Sensitive     TETRACYCLINE <=1 SENSITIVE Sensitive     VANCOMYCIN <=0.5 SENSITIVE Sensitive     TRIMETH/SULFA 80 RESISTANT Resistant     CLINDAMYCIN RESISTANT Resistant     RIFAMPIN <=0.5 SENSITIVE Sensitive     Inducible Clindamycin POSITIVE Resistant     * RARE STAPHYLOCOCCUS AUREUS  CULTURE, BLOOD (ROUTINE X 2) w Reflex to ID Panel     Status: None   Collection Time: 01/06/21 12:23 AM   Specimen: BLOOD  Result Value Ref Range Status   Specimen Description BLOOD RIGHT ASSIST CONTROL  Final   Special Requests   Final    BOTTLES DRAWN AEROBIC AND ANAEROBIC Blood Culture adequate volume   Culture   Final    NO GROWTH 5 DAYS Performed at [facility], [address] [place], [place] [address]    Report Status 01/11/2021 FINAL  Final  CULTURE, BLOOD (ROUTINE X 2) w Reflex to ID Panel     Status: None   Collection Time: 01/06/21 12:25 AM   Specimen: BLOOD  Result Value Ref Range Status   Specimen Description BLOOD LEFT ASSIST CONTROL  Final   Special Requests   Final    BOTTLES DRAWN AEROBIC ONLY Blood Culture adequate volume   Culture   Final    NO GROWTH 5 DAYS Performed at [facility], [address], [place], [place] [address]    Report Status 01/11/2021 FINAL  Final       Radiology Studies: [facility] Chest Port 1 View  Result Date: 01/13/2021 CLINICAL DATA:  Hypoxia. EXAM: PORTABLE CHEST 1 VIEW COMPARISON:  January 03, 2021. FINDINGS: Stable cardiomegaly with central pulmonary vascular congestion. Status post coronary bypass graft. Both lungs are clear. The visualized skeletal structures are unremarkable. IMPRESSION: Stable cardiomegaly with central pulmonary vascular congestion. Electronically Signed   By: Marijo Conception M.D.   On: 01/13/2021 16:46       LOS: 11 days   Clearence Vitug [employer] on [email or web address]  01/14/2021, 10:24 AM

## 2021-01-15 DIAGNOSIS — I4891 Unspecified atrial fibrillation: Secondary | ICD-10-CM | POA: Diagnosis not present

## 2021-01-15 DIAGNOSIS — E1169 Type 2 diabetes mellitus with other specified complication: Secondary | ICD-10-CM | POA: Diagnosis not present

## 2021-01-15 DIAGNOSIS — I25810 Atherosclerosis of coronary artery bypass graft(s) without angina pectoris: Secondary | ICD-10-CM | POA: Diagnosis not present

## 2021-01-15 DIAGNOSIS — L0390 Cellulitis, unspecified: Secondary | ICD-10-CM | POA: Diagnosis not present

## 2021-01-15 LAB — GLUCOSE, CAPILLARY
Glucose-Capillary: 364 mg/dL — ABNORMAL HIGH (ref 70–99)
Glucose-Capillary: 297 mg/dL — ABNORMAL HIGH (ref 70–99)
Glucose-Capillary: 274 mg/dL — ABNORMAL HIGH (ref 70–99)
Glucose-Capillary: 334 mg/dL — ABNORMAL HIGH (ref 70–99)

## 2021-01-15 MED ORDER — INSULIN GLARGINE-YFGN 100 UNIT/ML ~~LOC~~ SOLN
50.0000 [IU] | Freq: Two times a day (BID) | SUBCUTANEOUS | Status: DC
  Administered 2021-01-15 – 2021-01-18 (×6): 50 [IU] via SUBCUTANEOUS
  Filled 2021-01-15 (×7): qty 0.5

## 2021-01-15 MED ORDER — INSULIN GLARGINE-YFGN 100 UNIT/ML ~~LOC~~ SOLN
6.0000 [IU] | Freq: Once | SUBCUTANEOUS | Status: AC
  Administered 2021-01-15: 6 [IU] via SUBCUTANEOUS
  Filled 2021-01-15: qty 0.06

## 2021-01-15 NOTE — Progress Notes (Signed)
[facility] PROGRESS NOTE   Alec A Hanton Jr. [id number] DOB: 09-Dec-1955 DOA: 01/03/2021  PCP: [name], MD  Brief History/Interval Summary: 65 year old male with history of hypertension, congestive heart failure, hyperlipidemia, diabetes type 2, chronic ulcer on the left foot presented to the emergency department with complaints of fever, chills.  On presentation he was febrile with temperature of 104, he was tachycardic.  He was found to have worsening ulcer of the left foot.  Patient was hospitalized.  Seen by podiatry and underwent ray amputation.  He also developed atrial fibrillation with RVR during this hospital stay.  Consultants: Podiatry.  Cardiology  Procedures: Ray amputation left fifth metatarsal.  Tendo Achilles lengthening on the left.  Wound debridement left plantar fifth metatarsal phalangeal joint  Antibiotics: Anti-infectives (From admission, onward)    Start     Dose/Rate Route Frequency Ordered Stop   01/06/21 1230  Ampicillin-Sulbactam (UNASYN) 3 g in sodium chloride 0.9 % 100 mL IVPB        3 g 200 mL/hr over 30 Minutes Intravenous Every 6 hours 01/06/21 1130     01/05/21 0600  ceFAZolin (ANCEF) IVPB 2g/100 mL premix        2 g 200 mL/hr over 30 Minutes Intravenous On call to O.R. 01/04/21 1711 01/05/21 0529   01/04/21 1400  cefTRIAXone (ROCEPHIN) 2 g in sodium chloride 0.9 % 100 mL IVPB  Status:  Discontinued        2 g 200 mL/hr over 30 Minutes Intravenous Every 24 hours 01/04/21 0853 01/06/21 1130   01/04/21 1000  vancomycin (VANCOREADY) IVPB 1750 mg/350 mL  Status:  Discontinued        1,750 mg 175 mL/hr over 120 Minutes Intravenous Every 12 hours 01/03/21 2154 01/04/21 0757   01/04/21 1000  vancomycin (VANCOREADY) IVPB 1500 mg/300 mL  Status:  Discontinued        1,500 mg 150 mL/hr over 120 Minutes Intravenous Every 12 hours 01/04/21 0757 01/04/21 0853   01/03/21 2200  piperacillin-tazobactam (ZOSYN) IVPB 3.375 g  Status:   Discontinued        3.375 g 12.5 mL/hr over 240 Minutes Intravenous Every 8 hours 01/03/21 2154 01/04/21 0853   01/03/21 2100  vancomycin (VANCOREADY) IVPB 500 mg/100 mL        500 mg 100 mL/hr over 60 Minutes Intravenous  Once 01/03/21 1848 01/04/21 0019   01/03/21 1900  ceFEPIme (MAXIPIME) 2 g in sodium chloride 0.9 % 100 mL IVPB        2 g 200 mL/hr over 30 Minutes Intravenous  Once 01/03/21 1845 01/03/21 1925   01/03/21 1845  metroNIDAZOLE (FLAGYL) IVPB 500 mg  Status:  Discontinued        500 mg 100 mL/hr over 60 Minutes Intravenous Every 8 hours 01/03/21 1841 01/04/21 0853   01/03/21 1845  vancomycin (VANCOREADY) IVPB 2000 mg/400 mL        2,000 mg 200 mL/hr over 120 Minutes Intravenous  Once 01/03/21 1845 01/03/21 2248       Subjective/Interval History: Patient mentions that pain in the left foot is well controlled.  Denies any chest pain shortness of breath.  Overall he feels better.    Assessment/Plan:  Left foot cellulitis with chronic ulcer/streptococcal bacteremia Presented with worsening left foot ulcer.  MRI did not show any signs of osteomyelitis but showed extensive cellulitis and myositis.   Blood cultures positive for group B streptococcus.  Infectious disease was consulted.  Patient currently on Unasyn.  Patient underwent amputation of the affected area on 9/13.  He is nonweightbearing on the left lower extremity.  Seen by PT and OT and SNF is recommended for rehabilitation. Transthoracic echocardiogram did not suggest any vegetation. Patient was also seen by vascular surgery during this hospitalization and underwent left lower extremity angiogram.  No significant stenosis was noted.  Patient seems to be stable.  Afebrile.  Pain is well controlled. ID recommends transitioning to oral Augmentin on 9/19 and to continue that until 9/24.  Hypoxia probably due to sleep apnea Noted to have a hypoxia which prompted application of oxygen.  Chest x-ray suggested some  vascular congestion.  He was given a dose of furosemide.  Seems to have improved.  Sleep apnea is also contributing since patient has not been using the CPAP here in the hospital.  He also mentions that he takes Lasix at home.  Lasix was resumed.  Atrial fibrillation with RVR This is a new diagnosis for him.  He was on apixaban prior to admission but was on it for peripheral artery disease.  Cardiology has been following him here in the hospital.  Was on a amiodarone infusion which has been changed over to oral.  He was also on diltiazem infusion.  Changed over to oral diltiazem.  He also remains on digoxin and metoprolol.  Digoxin level 0.3 yesterday evening. Heart rate seems to be better.  He was changed over to long-acting Cardizem yesterday.  Will monitor heart rate trends over the next 24 hours.  He is anticoagulated with apixaban.  Diabetes mellitus type 2, uncontrolled with hyperglycemia Noted to be on SSI and glargine. HbA1c 8.9.   Prior to admission patient was on metformin Ozempic Lantus NovoLog. CBGs remain poorly controlled.  Will increase dose of glargine today.  He is on SSI and meal coverage.    Coronary artery disease Stable.  Continue home medications.  Essential hypertension Monitor blood pressures closely.  Blood pressure is reasonably well controlled.  History of BPH Continue Flomax  Morbid obesity Estimated body mass index is 45.78 kg/m as calculated from the following:   Height as of this encounter: '6\' 4"'$  (1.93 m).   Weight as of this encounter: 170.6 kg.    DVT Prophylaxis: Apixaban Code Status: Full code Family Communication: Discussed with patient.   Disposition Plan: Seen by PT.  SNF is recommended.  Status is: Inpatient  Remains inpatient appropriate because:IV treatments appropriate due to intensity of illness or inability to take PO and Inpatient level of care appropriate due to severity of illness  Dispo: The patient is from: Home               Anticipated d/c is to:  To be determined              Patient currently is not medically stable to d/c.   Difficult to place patient No      Medications: Scheduled:  amiodarone  200 mg Oral Daily   apixaban  5 mg Oral BID   atorvastatin  20 mg Oral QHS   clopidogrel  75 mg Oral Daily   digoxin  0.0625 mg Oral Daily   diltiazem  300 mg Oral Daily   ezetimibe  10 mg Oral QHS   furosemide  20 mg Oral Daily   gabapentin  300 mg Oral Daily   insulin aspart  0-15 Units Subcutaneous TID WC   insulin aspart  0-5 Units Subcutaneous QHS   insulin aspart  12 Units Subcutaneous  TID WC   insulin glargine-yfgn  44 Units Subcutaneous BID   metoprolol tartrate  50 mg Oral BID   pramipexole  2 mg Oral QHS   primidone  250 mg Oral BID   tamsulosin  0.4 mg Oral Daily   Continuous:  ampicillin-sulbactam (UNASYN) IV 3 g (01/15/21 0544)   [id number] **OR** acetaminophen, metoprolol tartrate, ondansetron **OR** ondansetron (ZOFRAN) IV, oxyCODONE   Objective:  Vital Signs  Vitals:   01/15/21 0400 01/15/21 0404 01/15/21 0758 01/15/21 0918  BP: 110/80  98/73 (!) 120/96  Pulse:   94   Resp: '18  18 19  '$ Temp: 98.2 F (36.8 C)  98 F (36.7 C)   TempSrc: Oral     SpO2: 96%  100%   Weight:  (!) 170.6 kg    Height:        Intake/Output Summary (Last 24 hours) at 01/15/2021 1148 Last data filed at 01/15/2021 0400 Gross per 24 hour  Intake 1126.26 ml  Output 1650 ml  Net -523.74 ml    Filed Weights   01/12/21 0500 01/14/21 0500 01/15/21 0404  Weight: (!) 170.9 kg (!) 170.6 kg (!) 170.6 kg    General appearance: Awake alert.  In no distress Resp: Clear to auscultation bilaterally.  Normal effort Cardio: S1-S2 is irregularly irregular. GI: Abdomen is soft.  Nontender nondistended.  Bowel sounds are present normal.  No masses organomegaly Left leg remains in a cast Neurologic: Alert and oriented x3.  No focal neurological deficits.      Lab Results:  Data Reviewed: I have  personally reviewed following labs and imaging studies  CBC: Recent Labs  Lab 01/12/21 0922 01/14/21 0411  WBC 8.3 9.0  HGB 13.1 12.5*  HCT 40.3 40.4  MCV 92.6 93.3  PLT 246 281     Basic Metabolic Panel: Recent Labs  Lab 01/12/21 0922 01/14/21 0411  NA 137 137  K 4.3 4.3  CL 102 102  CO2 28 31  GLUCOSE 230* 251*  BUN 15 16  CREATININE 0.75 0.90  CALCIUM 8.1* 7.9*     GFR: Estimated Creatinine Clearance: 139.2 mL/min (by C-G formula based on SCr of 0.9 mg/dL).     CBG: Recent Labs  Lab 01/14/21 0758 01/14/21 1151 01/14/21 1705 01/14/21 2129 01/15/21 0758  GLUCAP 208* 328* 265* 353* 364*      Recent Results (from the past 240 hour(s))  CULTURE, BLOOD (ROUTINE X 2) w Reflex to ID Panel     Status: None   Collection Time: 01/06/21 12:23 AM   Specimen: BLOOD  Result Value Ref Range Status   Specimen Description BLOOD RIGHT ASSIST CONTROL  Final   Special Requests   Final    BOTTLES DRAWN AEROBIC AND ANAEROBIC Blood Culture adequate volume   Culture   Final    NO GROWTH 5 DAYS Performed at [facility], [address] [place], [place] [address]    Report Status 01/11/2021 FINAL  Final  CULTURE, BLOOD (ROUTINE X 2) w Reflex to ID Panel     Status: None   Collection Time: 01/06/21 12:25 AM   Specimen: BLOOD  Result Value Ref Range Status   Specimen Description BLOOD LEFT ASSIST CONTROL  Final   Special Requests   Final    BOTTLES DRAWN AEROBIC ONLY Blood Culture adequate volume   Culture   Final    NO GROWTH 5 DAYS Performed at [facility], [address] [place], [place] [address]    Report Status  01/11/2021 FINAL  Final       Radiology Studies: DG Chest Port 1 View  Result Date: 01/13/2021 CLINICAL DATA:  Hypoxia. EXAM: PORTABLE CHEST 1 VIEW COMPARISON:  January 03, 2021. FINDINGS: Stable cardiomegaly with central pulmonary vascular congestion. Status post coronary bypass graft. Both lungs are clear. The visualized  skeletal structures are unremarkable. IMPRESSION: Stable cardiomegaly with central pulmonary vascular congestion. Electronically Signed   By: Marijo Conception M.D.   On: 01/13/2021 16:46       LOS: 12 days   [facility] Pager on [email or web address]  01/15/2021, 11:48 AM

## 2021-01-15 NOTE — Progress Notes (Signed)
Patient refused CPAP for the night  

## 2021-01-16 ENCOUNTER — Inpatient Hospital Stay: Payer: Medicare Other

## 2021-01-16 ENCOUNTER — Encounter: Payer: Self-pay | Admitting: Family Medicine

## 2021-01-16 DIAGNOSIS — L0390 Cellulitis, unspecified: Secondary | ICD-10-CM | POA: Diagnosis not present

## 2021-01-16 DIAGNOSIS — A419 Sepsis, unspecified organism: Secondary | ICD-10-CM | POA: Diagnosis not present

## 2021-01-16 LAB — BLOOD GAS, VENOUS
Acid-Base Excess: 5.7 mmol/L — ABNORMAL HIGH (ref 0.0–2.0)
Bicarbonate: 34 mmol/L — ABNORMAL HIGH (ref 20.0–28.0)
O2 Saturation: 89.3 %
Patient temperature: 37
pCO2, Ven: 66 mmHg — ABNORMAL HIGH (ref 44.0–60.0)
pH, Ven: 7.32 (ref 7.250–7.430)
pO2, Ven: 62 mmHg — ABNORMAL HIGH (ref 32.0–45.0)

## 2021-01-16 LAB — BASIC METABOLIC PANEL
Anion gap: 7 (ref 5–15)
BUN: 19 mg/dL (ref 8–23)
CO2: 29 mmol/L (ref 22–32)
Calcium: 8.3 mg/dL — ABNORMAL LOW (ref 8.9–10.3)
Chloride: 103 mmol/L (ref 98–111)
Creatinine, Ser: 1.11 mg/dL (ref 0.61–1.24)
GFR, Estimated: >60 mL/min (ref >60)
Glucose, Bld: 211 mg/dL — ABNORMAL HIGH (ref 70–99)
Potassium: 4.5 mmol/L (ref 3.5–5.1)
Sodium: 139 mmol/L (ref 135–145)

## 2021-01-16 LAB — GLUCOSE, CAPILLARY
Glucose-Capillary: 150 mg/dL — ABNORMAL HIGH (ref 70–99)
Glucose-Capillary: 209 mg/dL — ABNORMAL HIGH (ref 70–99)
Glucose-Capillary: 201 mg/dL — ABNORMAL HIGH (ref 70–99)
Glucose-Capillary: 208 mg/dL — ABNORMAL HIGH (ref 70–99)

## 2021-01-16 LAB — CBC
HCT: 39.9 % (ref 39.0–52.0)
Hemoglobin: 12.6 g/dL — ABNORMAL LOW (ref 13.0–17.0)
MCH: 30.4 pg (ref 26.0–34.0)
MCHC: 31.6 g/dL (ref 30.0–36.0)
MCV: 96.1 fL (ref 80.0–100.0)
Platelets: 271 10*3/uL (ref 150–400)
RBC: 4.15 MIL/uL — ABNORMAL LOW (ref 4.22–5.81)
RDW: 14 % (ref 11.5–15.5)
WBC: 9.2 10*3/uL (ref 4.0–10.5)
nRBC: 0 % (ref 0.0–0.2)

## 2021-01-16 LAB — MAGNESIUM: Magnesium: 2.2 mg/dL (ref 1.7–2.4)

## 2021-01-16 MED ORDER — INSULIN ASPART 100 UNIT/ML IJ SOLN: 9.0000 [IU] | Freq: Three times a day (TID) | INTRAMUSCULAR | 11 refills | Status: DC

## 2021-01-16 MED ORDER — OXYCODONE HCL 5 MG PO TABS: 5.0000 mg | ORAL_TABLET | ORAL | 0 refills | Status: AC | PRN

## 2021-01-16 MED ORDER — FUROSEMIDE 10 MG/ML IJ SOLN
20.0000 mg | Freq: Once | INTRAMUSCULAR | Status: AC
  Administered 2021-01-17: 20 mg via INTRAVENOUS
  Filled 2021-01-16: qty 2

## 2021-01-16 MED ORDER — METOPROLOL TARTRATE 25 MG PO TABS: 25.0000 mg | ORAL_TABLET | Freq: Two times a day (BID) | ORAL | Status: DC

## 2021-01-16 MED ORDER — AMOXICILLIN-POT CLAVULANATE 875-125 MG PO TABS: 1.0000 | ORAL_TABLET | Freq: Two times a day (BID) | ORAL | Status: AC

## 2021-01-16 MED ORDER — DILTIAZEM HCL ER COATED BEADS 300 MG PO CP24: 300.0000 mg | ORAL_CAPSULE | Freq: Every day | ORAL | Status: DC

## 2021-01-16 MED ORDER — LANTUS SOLOSTAR 100 UNIT/ML ~~LOC~~ SOPN: 50.0000 [IU] | PEN_INJECTOR | Freq: Two times a day (BID) | SUBCUTANEOUS | 11 refills | Status: DC

## 2021-01-16 MED ORDER — AMIODARONE HCL 200 MG PO TABS: 200.0000 mg | ORAL_TABLET | Freq: Every day | ORAL | Status: DC

## 2021-01-16 MED ORDER — OZEMPIC (0.25 OR 0.5 MG/DOSE) 2 MG/1.5ML ~~LOC~~ SOPN: 0.2500 mg | PEN_INJECTOR | SUBCUTANEOUS | Status: DC

## 2021-01-16 MED ORDER — DIGOXIN 62.5 MCG PO TABS: 0.0625 mg | ORAL_TABLET | Freq: Every day | ORAL | Status: DC

## 2021-01-16 MED ORDER — METOPROLOL TARTRATE 25 MG PO TABS
25.0000 mg | ORAL_TABLET | Freq: Two times a day (BID) | ORAL | Status: DC
  Administered 2021-01-17 – 2021-01-19 (×5): 25 mg via ORAL
  Filled 2021-01-16 (×5): qty 1

## 2021-01-16 MED ORDER — AMOXICILLIN-POT CLAVULANATE 875-125 MG PO TABS
1.0000 | ORAL_TABLET | Freq: Two times a day (BID) | ORAL | Status: DC
  Administered 2021-01-16 – 2021-01-19 (×7): 1 via ORAL
  Filled 2021-01-16 (×6): qty 1

## 2021-01-16 NOTE — Care Management Important Message (Signed)
Important Message  Patient Details  Name: [name]. MRN: [id number] Date of Birth: 25-Aug-1955   Medicare Important Message Given:  Yes     [name] 01/16/2021, 4:48 PM

## 2021-01-16 NOTE — TOC Progression Note (Signed)
Transition of Care (TOC) - Progression Note    Patient Details  Name: [name]. MRN: [id number] Date of Birth: Jun 18, 1955  Transition of Care [facility]) CM/SW Contact  Eileen Stanford, LCSW Phone Number: 01/16/2021, 12:39 PM  Clinical Narrative:  CSW spoke with pt regarding SNF and pt states he wants to talk to his spouse about it. Pt states he will talk to her in the next 1-2 hours. Pt has been faxed out however, no bed offers at this time. Pt will need prior auth for SNF once choice is made.     Expected Discharge Plan: [place] (vs home health) Barriers to Discharge: Continued Medical Work up  Expected Discharge Plan and Services Expected Discharge Plan: [place] (vs home health)     Post Acute Care Choice: [place] arrangements for the past 2 months: Single Family Home                                       Social Determinants of Health (SDOH) Interventions    Readmission Risk Interventions No flowsheet data found.

## 2021-01-16 NOTE — Progress Notes (Signed)
Patient's insulin this morning was 150. (2 units + 12 units) of Insulin administered at breakfast.

## 2021-01-16 NOTE — Discharge Summary (Signed)
[facility]  Physician Discharge Summary   Patient ID: [name]. MRN: [id number] DOB/AGE: [id number] y.o.  Admit date: 01/03/2021 Discharge date:   01/16/2021   PCP: [name], MD  DISCHARGE DIAGNOSES:  Left foot cellulitis with chronic ulcer status post ray amputation of left fifth metatarsal Streptococcal bacteremia Atrial fibrillation with RVR, new diagnosis Diabetes mellitus type 2, uncontrolled with hyperglycemia Coronary artery disease Essential hypertension History of BPH Obstructive sleep apnea   RECOMMENDATIONS FOR OUTPATIENT FOLLOW UP: Please check a CBC and basic metabolic panel in 1 week Needs to be seen by Dr. Clayborn Bigness with cardiology in 1 to 2 weeks   Home Health: Going to SNF Equipment/Devices: None  CODE STATUS: Full code  DISCHARGE CONDITION: fair  Diet recommendation: Modified carbohydrate  INITIAL HISTORY: 65 year old male with history of hypertension, congestive heart failure, hyperlipidemia, diabetes type 2, chronic ulcer on the left foot presented to the emergency department with complaints of fever, chills.  On presentation he was febrile with temperature of 104, he was tachycardic.  He was found to have worsening ulcer of the left foot.  Patient was hospitalized.  Seen by podiatry and underwent ray amputation.  He also developed atrial fibrillation with RVR during this hospital stay.  Consultants: Podiatry.  Cardiology   Procedures: Ray amputation left fifth metatarsal.  Tendo Achilles lengthening on the left.  Wound debridement left plantar fifth metatarsal phalangeal joint  HOSPITAL COURSE:   Left foot cellulitis with chronic ulcer/streptococcal bacteremia Presented with worsening left foot ulcer.  MRI did not show any signs of osteomyelitis but showed extensive cellulitis and myositis.   Blood cultures positive for group B streptococcus.  Infectious disease was consulted.   Patient underwent amputation of the  affected area on 9/13.  He is nonweightbearing on the left lower extremity.  Seen by PT and OT and SNF is recommended for rehabilitation. Transthoracic echocardiogram did not suggest any vegetation. Patient was also seen by vascular surgery during this hospitalization and underwent left lower extremity angiogram.  No significant stenosis was noted.  Patient seems to be stable.  Afebrile.  Pain is well controlled. Patient was on Unasyn.  ID recommends transitioning to oral Augmentin on 9/19 and to continue that until 9/24.   Hypoxia probably due to sleep apnea Noted to have a hypoxia which prompted application of oxygen.  Chest x-ray suggested some vascular congestion.  He was given a dose of furosemide.  Seems to have improved.  Sleep apnea is also contributing since patient has not been using the CPAP here in the hospital.  He also mentions that he takes Lasix at home.  Lasix was resumed.  Atrial fibrillation with RVR This is a new diagnosis for him.  He was on apixaban prior to admission but was on it for peripheral artery disease.  Cardiology has been following him here in the hospital.  Was on a amiodarone infusion which has been changed over to oral.  He was also on diltiazem infusion.  Changed over to oral diltiazem.  He also remains on digoxin and metoprolol.  Digoxin level 0.3. Patient seems to have converted to sinus rhythm as of yesterday.  We will continue current dose of Cardizem.  We will decrease the dose of his metoprolol.  Continue amiodarone and digoxin.  Continue anticoagulation with apixaban.    Diabetes mellitus type 2, uncontrolled with hyperglycemia HbA1c 8.9.  Prior to admission patient was on metformin Ozempic Lantus NovoLog.  These can be resumed at discharge.  Coronary artery disease Stable.  Continue home medications.  Essential hypertension Stable.  Continue medications.  History of BPH Continue Flomax  Morbid obesity Estimated body mass index is 45.78 kg/m as  calculated from the following:   Height as of this encounter: '6\' 4"'$  (1.93 m).   Weight as of this encounter: 170.6 kg.    Patient is stable overall.  Okay for discharge to SNF.  PERTINENT LABS:  The results of significant diagnostics from this hospitalization (including imaging, microbiology, ancillary and laboratory) are listed below for reference.     Labs:  COVID-19 Labs   Lab Results  Component Value Date   SARSCOV2NAA NEGATIVE 01/03/2021   [place] NEGATIVE 02/23/2020   [place] NEGATIVE 01/29/2020      Basic Metabolic Panel: Recent Labs  Lab 01/12/21 0922 01/14/21 0411 01/16/21 0357  NA 137 137 139  K 4.3 4.3 4.5  CL 102 102 103  CO2 '28 31 29  '$ GLUCOSE 230* 251* 211*  BUN '15 16 19  '$ CREATININE 0.75 0.90 1.11  CALCIUM 8.1* 7.9* 8.3*  MG  --   --  2.2    CBC: Recent Labs  Lab 01/12/21 0922 01/14/21 0411 01/16/21 0357  WBC 8.3 9.0 9.2  HGB 13.1 12.5* 12.6*  HCT 40.3 40.4 39.9  MCV 92.6 93.3 96.1  PLT 246 281 271    BNP: BNP (last 3 results) Recent Labs    01/03/21 1726  BNP 197.5*     CBG: Recent Labs  Lab 01/15/21 1157 01/15/21 1623 01/15/21 2057 01/16/21 0752 01/16/21 1153  GLUCAP 297* 274* 334* 150* 209*     IMAGING STUDIES MR FOOT LEFT WO CONTRAST  Result Date: 01/04/2021 CLINICAL DATA:  Diabetic foot ulcer. EXAM: MRI OF THE LEFT FOOT WITHOUT CONTRAST TECHNIQUE: Multiplanar, multisequence MR imaging of the left foot was performed. No intravenous contrast was administered. COMPARISON:  Radiographs 01/03/2021. MRI 02/23/2019 FINDINGS: There is a ulceration noted along the plantar lateral aspect of the forefoot near the level of the fifth metatarsal head. No underlying subcutaneous abscess is identified. Diffuse subcutaneous soft tissue swelling/edema/fluid suggesting cellulitis. No soft tissue abscesses are identified for certain without contrast. Diffuse myositis without findings for pyomyositis. Surgical changes related to a  prior amputation involving the third digit and third distal metatarsal. No findings suspicious septic arthritis or osteomyelitis. Chronic cystic type degenerative changes are noted in the bases of the third, fourth and fifth metatarsals. The major tendons and ligaments appear grossly intact. IMPRESSION: 1. Soft tissue ulceration along the plantar lateral aspect of the forefoot near the level of the fifth metatarsal head. No underlying subcutaneous abscess. 2. Diffuse cellulitis and myositis. 3. No findings for septic arthritis or osteomyelitis. Electronically Signed   By: Marijo Sanes M.D.   On: 01/04/2021 05:08   PERIPHERAL VASCULAR CATHETERIZATION  Result Date: 01/04/2021 See surgical note for result.  DG Chest Port 1 View  Result Date: 01/13/2021 CLINICAL DATA:  Hypoxia. EXAM: PORTABLE CHEST 1 VIEW COMPARISON:  January 03, 2021. FINDINGS: Stable cardiomegaly with central pulmonary vascular congestion. Status post coronary bypass graft. Both lungs are clear. The visualized skeletal structures are unremarkable. IMPRESSION: Stable cardiomegaly with central pulmonary vascular congestion. Electronically Signed   By: Marijo Conception M.D.   On: 01/13/2021 16:46   DG Chest Port 1 View  Result Date: 01/03/2021 CLINICAL DATA:  Sepsis.  Evaluate for infiltrate. EXAM: PORTABLE CHEST 1 VIEW COMPARISON:  01/06/2020 FINDINGS: Previous median sternotomy and CABG procedure. Stable cardiomediastinal contours. Pulmonary vascular congestion.  No pleural effusion or edema. No airspace densities. IMPRESSION: Pulmonary vascular congestion. Electronically Signed   By: Alec Moors M.D.   On: 01/03/2021 18:02   DG Foot Complete Left  Result Date: 01/10/2021 CLINICAL DATA:  Postop EXAM: LEFT FOOT - COMPLETE 3+ VIEW COMPARISON:  MRI left foot 01/04/2021, left foot 01/03/2021 FINDINGS: Interval proximal fifth digit metatarsal amputation with clean margins. Redemonstration of prior transmetatarsal third digit amputation with  clear margins. No cortical erosion or destruction. There is no evidence of fracture or dislocation. There is no evidence of arthropathy or other focal bone abnormality. Subcutaneus soft tissue edema and emphysema overlying the fifth digit likely postsurgical. Subcutaneus soft tissue emphysema overlying the distal posterior ankle with associated skin staples. IMPRESSION: 1. Interval proximal fifth digit metatarsal amputation with clean margins. 2. Prior transmetatarsal third digit amputation with clear margins. 3. Associated subcutaneus soft tissue edema emphysema likely postsurgical. Electronically Signed   By: Iven Finn M.D.   On: 01/10/2021 19:47   DG Foot Complete Left  Result Date: 01/03/2021 CLINICAL DATA:  Evaluate for osteomyelitis. EXAM: LEFT FOOT - COMPLETE 3+ VIEW COMPARISON:  12/28/2020 FINDINGS: Status post third ray amputation at the level of the head of the third metatarsal bone. Diffuse soft tissue swelling is identified. There is a focal bone erosion involving the lateral aspect of the head of the fifth metatarsal bone. This is a new finding compared with 02/25/2020, but appears similar to 12/28/20. No additional focal bone erosions. No fracture or dislocation. No radio-opaque foreign bodies. IMPRESSION: 1. Chronic bone erosion involving the lateral aspect of the head of the fifth metatarsal bone compatible with osteomyelitis. Unchanged from 12/28/2020 but new compared with 02/25/2020. 2. Diffuse soft tissue swelling. 3. Status post third ray amputation. Electronically Signed   By: Alec Moors M.D.   On: 01/03/2021 19:51   DG Foot Complete Left  Result Date: 12/31/2020 CLINICAL DATA:  Diabetic ulcer EXAM: LEFT FOOT - COMPLETE 3+ VIEW COMPARISON:  Prior radiographs of the left foot 02/25/2020 FINDINGS: Surgical changes of prior partial amputation of the third toe through the mid aspect of the metatarsal. The amputation site appears well healed. Focal soft tissue swelling present  throughout the forefoot. A small rounded lucency is present underlying the fifth MTP joint. There is some high attenuation material within the lucency which is nonspecific and indeterminate. No rare fraction or destructive changes of the visualized bones to suggest osteomyelitis. No acute fracture or malalignment. No radiopaque foreign body. IMPRESSION: 1. Soft tissue ulceration along the plantar surface of the fifth MTP joint. Some internal radiodense material may represent a small amount of packing material, or mineralization of the soft tissues. 2. Surgical changes of prior healed partial amputation of the third toe. 3. Diffuse soft tissue swelling throughout the forefoot. Electronically Signed   By: Jacqulynn Cadet M.D.   On: 12/31/2020 08:48   ECHOCARDIOGRAM COMPLETE  Result Date: 01/05/2021    ECHOCARDIOGRAM REPORT   Patient Name:   Clare Breyer. Date of Exam: 01/04/2021 Medical Rec #:  [id number]             Height:       76.0 in Accession #:    [id number]            Weight:       335.0 lb Date of Birth:  04-25-1956            BSA:          2.758 m Patient  Age:    19 years              BP:           130/59 mmHg Patient Gender: M                     HR:           105 bpm. Exam Location:  [facility] Procedure: 2D Echo, Color Doppler, Cardiac Doppler and Intracardiac            Opacification Agent Indications:     R06.00 Dyspnea  History:         Patient has no prior history of Echocardiogram examinations.                  CAD, PVD; Risk Factors:Hypertension, Diabetes, Dyslipidemia and                  Sleep Apnea.  Sonographer:     Charmayne Sheer Referring Phys:  [id number] Jacqlyn Krauss Diagnosing Phys: Donnelly Angelica  Sonographer Comments: Suboptimal apical window and suboptimal subcostal window. Image acquisition challenging due to patient body habitus and Image acquisition challenging due to uncooperative patient. IMPRESSIONS  1. Left ventricular ejection fraction, by estimation, is 60 to 65%. The left  ventricle has normal function. The left ventricle has no regional wall motion abnormalities. Still images unable to load. Cannot assess diastolic dysfunction.  2. Right ventricular systolic function is normal. The right ventricular size is normal.  3. The mitral valve is normal in structure. No evidence of mitral valve regurgitation.  4. The aortic valve is grossly normal. Aortic valve regurgitation is not visualized. Mild aortic valve sclerosis is present, with no evidence of aortic valve stenosis.  5. The inferior vena cava is normal in size with greater than 50% respiratory variability, suggesting right atrial pressure of 3 mmHg. FINDINGS  Left Ventricle: Left ventricular ejection fraction, by estimation, is 60 to 65%. The left ventricle has normal function. The left ventricle has no regional wall motion abnormalities. Definity contrast agent was given IV to delineate the left ventricular  endocardial borders. The left ventricular internal cavity size was normal in size. There is no left ventricular hypertrophy. Still images unable to load. Cannot assess diastolic dysfunction. Right Ventricle: The right ventricular size is normal. No increase in right ventricular wall thickness. Right ventricular systolic function is normal. Left Atrium: Left atrial size was not well visualized. Right Atrium: Right atrial size was not well visualized. Pericardium: There is no evidence of pericardial effusion. Mitral Valve: The mitral valve is normal in structure. No evidence of mitral valve regurgitation. MV peak gradient, 7.7 mmHg. The mean mitral valve gradient is 5.0 mmHg. Tricuspid Valve: The tricuspid valve is not well visualized. Tricuspid valve regurgitation is not demonstrated. Aortic Valve: The aortic valve is grossly normal. Aortic valve regurgitation is not visualized. Mild aortic valve sclerosis is present, with no evidence of aortic valve stenosis. Aortic valve mean gradient measures 10.0 mmHg. Aortic valve peak  gradient measures 16.6 mmHg. Aortic valve area, by VTI measures 4.85 cm. Pulmonic Valve: The pulmonic valve was not well visualized. Pulmonic valve regurgitation is not visualized. Aorta: The aortic root is normal in size and structure. Venous: The inferior vena cava is normal in size with greater than 50% respiratory variability, suggesting right atrial pressure of 3 mmHg. IAS/Shunts: The interatrial septum was not assessed.  LEFT VENTRICLE PLAX 2D LVIDd:  6.10 cm LVIDs:         4.30 cm LV PW:         0.90 cm LV IVS:        1.10 cm LVOT diam:     2.70 cm LV SV:         140 LV SV Index:   51 LVOT Area:     5.73 cm  RIGHT VENTRICLE RV Basal diam:  4.50 cm LEFT ATRIUM         Index      RIGHT ATRIUM           Index LA diam:    4.80 cm 1.74 cm/m RA Area:     20.30 cm                                RA Volume:   61.00 ml  22.12 ml/m  AORTIC VALVE                    PULMONIC VALVE AV Area (Vmax):    3.82 cm     PV Vmax:       1.26 m/s AV Area (Vmean):   3.46 cm     PV Vmean:      81.600 cm/s AV Area (VTI):     4.85 cm     PV VTI:        0.174 m AV Vmax:           204.00 cm/s  PV Peak grad:  6.4 mmHg AV Vmean:          150.000 cm/s PV Mean grad:  3.0 mmHg AV VTI:            0.288 m AV Peak Grad:      16.6 mmHg AV Mean Grad:      10.0 mmHg LVOT Vmax:         136.00 cm/s LVOT Vmean:        90.700 cm/s LVOT VTI:          0.244 m LVOT/AV VTI ratio: 0.85  AORTA Ao Root diam: 3.60 cm MITRAL VALVE MV Area (PHT): 5.97 cm     SHUNTS MV Area VTI:   3.92 cm     Systemic VTI:  0.24 m MV Peak grad:  7.7 mmHg     Systemic Diam: 2.70 cm MV Mean grad:  5.0 mmHg MV Vmax:       1.39 m/s MV Vmean:      100.0 cm/s MV Decel Time: 127 msec MV E velocity: 128.00 cm/s MV A velocity: 123.00 cm/s MV E/A ratio:  1.04 Donnelly Angelica Electronically signed by Donnelly Angelica Signature Date/Time: 01/05/2021/4:29:09 PM    Final     DISCHARGE EXAMINATION: Vitals:   01/16/21 0520 01/16/21 0700 01/16/21 0828 01/16/21 1141  BP: 112/80 117/80  117/80 137/78  Pulse: (!) 58 (!) 59 60 67  Resp: '17 17 13   '$ Temp: 97.6 F (36.4 C) 98.7 F (37.1 C) 98.7 F (37.1 C) 98.7 F (37.1 C)  TempSrc: Oral Oral Oral Oral  SpO2: 98% 93% 97%   Weight: (!) 175.2 kg  (!) 175.2 kg   Height:   '6\' 4"'$  (1.93 m)    General appearance: Awake alert.  In no distress Resp: Clear to auscultation bilaterally.  Normal effort Cardio: S1-S2 is normal regular.  No S3-S4.  No rubs murmurs or bruit GI: Abdomen is  soft.  Nontender nondistended.  Bowel sounds are present normal.  No masses organomegaly    DISPOSITION: SNF  Discharge Instructions     Call MD for:  difficulty breathing, headache or visual disturbances   Complete by: As directed    Call MD for:  extreme fatigue   Complete by: As directed    Call MD for:  persistant dizziness or light-headedness   Complete by: As directed    Call MD for:  persistant nausea and vomiting   Complete by: As directed    Call MD for:  redness, tenderness, or signs of infection (pain, swelling, redness, odor or green/yellow discharge around incision site)   Complete by: As directed    Call MD for:  severe uncontrolled pain   Complete by: As directed    Call MD for:  temperature >100.4   Complete by: As directed    Diet - low sodium heart healthy   Complete by: As directed    Discharge instructions   Complete by: As directed    Please review instructions on the discharge summary.  You were cared for by a hospitalist during your hospital stay. If you have any questions about your discharge medications or the care you received while you were in the hospital after you are discharged, you can call the unit and asked to speak with the hospitalist on call if the hospitalist that took care of you is not available. Once you are discharged, your primary care physician will handle any further medical issues. Please note that NO REFILLS for any discharge medications will be authorized once you are discharged, as it is  imperative that you return to your primary care physician (or establish a relationship with a primary care physician if you do not have one) for your aftercare needs so that they can reassess your need for medications and monitor your lab values. If you do not have a primary care physician, you can call [phone] for a physician referral.   Discharge wound care:   Complete by: As directed    Keep the current cast over his left lower extremity.  Please call either Dr. Luana Shu Dr. Vickki Muff or Dr. Cleda Mccreedy at [facility] for further instructions.   Increase activity slowly   Complete by: As directed          Allergies as of 01/16/2021       Reactions   Statins    Other reaction(s): Muscle Pain Causes legs to ache per pt        Medication List     STOP taking these medications    Acidophilus Caps capsule   aspirin EC 81 MG tablet   insulin aspart 100 UNIT/ML injection Commonly known as: novoLOG Replaced by: insulin aspart 100 UNIT/ML injection   isosorbide mononitrate 60 MG 24 hr tablet Commonly known as: IMDUR   losartan 100 MG tablet Commonly known as: COZAAR   oxyCODONE-acetaminophen 5-325 MG tablet Commonly known as: Percocet       TAKE these medications    amiodarone 200 MG tablet Commonly known as: PACERONE Take 1 tablet (200 mg total) by mouth daily.   amoxicillin-clavulanate 875-125 MG tablet Commonly known as: AUGMENTIN Take 1 tablet by mouth every 12 (twelve) hours for 5 days.   apixaban 5 MG Tabs tablet Commonly known as: ELIQUIS Take 1 tablet (5 mg total) by mouth 2 (two) times daily.   atorvastatin 40 MG tablet Commonly known as: LIPITOR Take 1 tablet (40 mg total)  by mouth daily.   clopidogrel 75 MG tablet Commonly known as: PLAVIX Take 1 tablet (75 mg total) by mouth daily with breakfast.   Digoxin 62.5 MCG Tabs Take 0.0625 mg by mouth daily.   diltiazem 300 MG 24 hr capsule Commonly known as: CARDIZEM CD Take 1 capsule (300 mg  total) by mouth daily.   ezetimibe 10 MG tablet Commonly known as: ZETIA Take 10 mg by mouth daily.   ferrous sulfate 325 (65 FE) MG tablet Take 325 mg by mouth daily with breakfast.   furosemide 20 MG tablet Commonly known as: LASIX Take 20 mg by mouth daily as needed for fluid or edema.   gabapentin 300 MG capsule Commonly known as: NEURONTIN Take 300 mg by mouth daily.   insulin aspart 100 UNIT/ML injection Commonly known as: novoLOG Inject 9 Units into the skin 3 (three) times daily with meals. Replaces: insulin aspart 100 UNIT/ML injection   insulin lispro 100 UNIT/ML injection Commonly known as: HUMALOG Inject 15-28 Units into the skin 3 (three) times daily with meals.   Lantus SoloStar 100 UNIT/ML Solostar Pen Generic drug: insulin glargine Inject 50 Units into the skin 2 (two) times daily. What changed: how much to take   MEDIUM CHAIN TRIGLYCERIDES PO Take 1,000 mg by mouth daily.   metFORMIN 1000 MG tablet Commonly known as: GLUCOPHAGE Take 1,000 mg by mouth 2 (two) times daily.   metoprolol tartrate 25 MG tablet Commonly known as: LOPRESSOR Take 1 tablet (25 mg total) by mouth 2 (two) times daily. Start taking on: January 17, 2021 What changed:  medication strength how much to take   multivitamin with minerals tablet Take 1 tablet by mouth daily.   nitroGLYCERIN 0.4 MG SL tablet Commonly known as: NITROSTAT Place 0.4 mg under the tongue daily as needed.   oxyCODONE 5 MG immediate release tablet Commonly known as: Oxy IR/ROXICODONE Take 1 tablet (5 mg total) by mouth every 4 (four) hours as needed for up to 7 days for moderate pain.   Ozempic (0.25 or 0.5 MG/DOSE) 2 MG/1.5ML Sopn Generic drug: Semaglutide(0.25 or 0.'5MG'$ /DOS) Inject 0.25 mg into the skin once a week. What changed:  how much to take when to take this   pramipexole 1 MG tablet Commonly known as: MIRAPEX Take 2 mg by mouth at bedtime.   primidone 250 MG tablet Commonly known  as: MYSOLINE Take 250 mg by mouth 2 (two) times daily.   tamsulosin 0.4 MG Caps capsule Commonly known as: FLOMAX Take 0.4 mg by mouth daily.   vitamin B-12 1000 MCG tablet Commonly known as: CYANOCOBALAMIN Take 10,000 mcg by mouth daily. Energy shot   zinc gluconate 50 MG tablet Take 50 mg by mouth daily.               Discharge Care Instructions  (From admission, onward)           Start     Ordered   01/16/21 0000  Discharge wound care:       Comments: Keep the current cast over his left lower extremity.  Please call either Dr. Luana Shu Dr. Vickki Muff or Dr. Cleda Mccreedy at [facility] for further instructions.   01/16/21 0919              Follow-up Information     Samara Deist, DPM. Schedule an appointment as soon as possible for a visit in 1 week(s).   Specialty: Podiatry Why: Dr. Luana Shu will be out of office the next 2  weeks, followup with Dr. Vickki Muff or Dr. Cleda Mccreedy next week, call to make appointment Contact information: [address] [address] [phone]         [name], MD Follow up.   Specialty: Internal Medicine Contact information: [place] [address] [phone]         Yolonda Kida, MD. Schedule an appointment as soon as possible for a visit in 2 week(s).   Specialties: Cardiology, Internal Medicine Why: For atrial fibrillation Contact information: [place] [address] [phone]                 TOTAL DISCHARGE TIME: 26 minutes  [place]  [facility] Pager on [email or web address]  01/16/2021, 12:23 PM

## 2021-01-16 NOTE — Progress Notes (Signed)
Patient complained of a headache at 11:30. Pain was 7/10. 650 mg Tylenol given PRN. 12:30 reassessment  3/10.

## 2021-01-16 NOTE — Progress Notes (Signed)
Occupational Therapy Treatment Patient Details Name: Ole Lafon. MRN: [id number] DOB: 1955/12/06 Today's Date: 01/16/2021   History of present illness Per MD Notes: [name] is a 65 year old male with history of hypertension, congestive heart failure, hyperlipidemia, diabetes type 2, chronic ulcer on the left foot presented to the emergency department with complaints of fever, chills.  On presentation he was febrile with temperature of 104, he was tachycardic.  He was found to have worsening ulcer of the left foot.  Patient was hospitalized.  Seen by podiatry and underwent ray amputation.  He also developed atrial fibrillation with RVR during this hospital stay.   OT comments  Chart reviewed, pt greeted in bed agreeable to OT tx session. Tx session targeted progressing strength and endurance in order to facilitate improved independent, safe ADL completion. Pt encouraged to transfer OOB to chair, declines on this date. Grooming, bathing, dressing tasks performed at EOB with good tolerance, sitting balance. Pt with improved NWBing on LLE with STS during ADL participation however continues to require frequent vcs for appropriate technique.  VSS on 2L [place] throughout tx session. Pt is left as received, NAD, all needs met. OT will continue to follow while admitted. OT continues to recommend discharge to SNF to address functional deficits.   Recommendations for follow up therapy are one component of a multi-disciplinary discharge planning process, led by the attending physician.  Recommendations may be updated based on patient status, additional functional criteria and insurance authorization.    Follow Up Recommendations  SNF    Equipment Recommendations  3 in 1 bedside commode (per next venue of care)    Recommendations for Other Services      Precautions / Restrictions Precautions Precautions: Fall Restrictions Weight Bearing Restrictions: Yes LLE Weight Bearing: Non  weight bearing       Mobility Bed Mobility Overal bed mobility: Needs Assistance Bed Mobility: Supine to Sit;Sit to Supine     Supine to sit: Supervision Sit to supine: Supervision   General bed mobility comments: 1 vc required for body mechanics on this date; [facility] raised    Transfers Overall transfer level: Needs assistance Equipment used: Rolling walker (2 wheeled) Transfers: Sit to/from Stand Sit to Stand: Min assist         General transfer comment: Elevated bed for STS with bariatic RW.    Balance Overall balance assessment: Needs assistance Sitting-balance support: Single extremity supported Sitting balance-Leahy Scale: Normal     Standing balance support: Bilateral upper extremity supported Standing balance-Leahy Scale: Fair Standing balance comment: F standing balance with use of RW. Pt able to kick leg out to adhere to nwbing, able to sustain ~20 seconds.                           ADL either performed or assessed with clinical judgement   ADL Overall ADL's : Needs assistance/impaired                                       General ADL Comments: MIN A for UB dressing, UB bathing; MOD A for LB Bathing; MIN A for STS 2x during ADL tasks. Frequent vcs required for NWBing to LLE;     Vision Baseline Vision/History: 0 No visual deficits     Perception     Praxis      Cognition Arousal/Alertness: Awake/alert Behavior  During Therapy: WFL for tasks assessed/performed Overall Cognitive Status: Within Functional Limits for tasks assessed                                                General Comments vss throughout session. 3L [place] 95-95% throughout.    Pertinent Vitals/ Pain       Pain Assessment: 0-10 Pain Score: 1  Pain Location: lower back Pain Descriptors / Indicators: Discomfort Pain Intervention(s): Limited activity within patient's tolerance;Monitored during session;Repositioned                                                           Frequency  Min 2X/week        Progress Toward Goals  OT Goals(current goals can now be found in the care plan section)  Progress towards OT goals: Progressing toward goals  Acute Rehab OT Goals Patient Stated Goal: to get better OT Goal Formulation: With patient Potential to Achieve Goals: Good  Plan Discharge plan remains appropriate    Co-evaluation                 AM-PAC OT "6 Clicks" Daily Activity     Outcome Measure   Help from another person eating meals?: None Help from another person taking care of personal grooming?: A Little Help from another person toileting, which includes using toliet, bedpan, or urinal?: A Lot Help from another person bathing (including washing, rinsing, drying)?: A Little Help from another person to put on and taking off regular upper body clothing?: A Little Help from another person to put on and taking off regular lower body clothing?: A Lot 6 Click Score: 17    End of Session Equipment Utilized During Treatment: Rolling walker  OT Visit Diagnosis: Other abnormalities of gait and mobility (R26.89);Muscle weakness (generalized) (M62.81)   Activity Tolerance Patient tolerated treatment well   Patient Left in bed;with call bell/phone within reach;with bed alarm set   Nurse Communication Mobility status (NT)        Time: 0136-0205 OT Time Calculation (min): 29 min  Charges: OT General Charges $OT Visit: 1 Visit OT Treatments $Self Care/Home Management : 23-37 mins Shanon Payor, OTD OTR/L  01/16/21, 2:25 PM

## 2021-01-16 NOTE — Progress Notes (Signed)
Insulin at lunch was 209. (5 units + 12 units given)

## 2021-01-17 DIAGNOSIS — E1169 Type 2 diabetes mellitus with other specified complication: Secondary | ICD-10-CM | POA: Diagnosis not present

## 2021-01-17 DIAGNOSIS — I4891 Unspecified atrial fibrillation: Secondary | ICD-10-CM | POA: Diagnosis not present

## 2021-01-17 DIAGNOSIS — J810 Acute pulmonary edema: Secondary | ICD-10-CM

## 2021-01-17 DIAGNOSIS — I25810 Atherosclerosis of coronary artery bypass graft(s) without angina pectoris: Secondary | ICD-10-CM | POA: Diagnosis not present

## 2021-01-17 LAB — GLUCOSE, CAPILLARY
Glucose-Capillary: 177 mg/dL — ABNORMAL HIGH (ref 70–99)
Glucose-Capillary: 230 mg/dL — ABNORMAL HIGH (ref 70–99)
Glucose-Capillary: 257 mg/dL — ABNORMAL HIGH (ref 70–99)
Glucose-Capillary: 180 mg/dL — ABNORMAL HIGH (ref 70–99)

## 2021-01-17 MED ORDER — POTASSIUM CHLORIDE CRYS ER 20 MEQ PO TBCR
20.0000 meq | EXTENDED_RELEASE_TABLET | Freq: Once | ORAL | Status: AC
  Administered 2021-01-17: 20 meq via ORAL
  Filled 2021-01-17: qty 1

## 2021-01-17 MED ORDER — FUROSEMIDE 40 MG PO TABS: 40.0000 mg | ORAL_TABLET | Freq: Every day | ORAL | Status: DC

## 2021-01-17 MED ORDER — FUROSEMIDE 10 MG/ML IJ SOLN
40.0000 mg | Freq: Every day | INTRAMUSCULAR | Status: DC
  Administered 2021-01-17 – 2021-01-19 (×3): 40 mg via INTRAVENOUS
  Filled 2021-01-17 (×3): qty 4

## 2021-01-17 MED ORDER — FUROSEMIDE 10 MG/ML IJ SOLN
40.0000 mg | Freq: Once | INTRAMUSCULAR | Status: AC
  Administered 2021-01-17: 40 mg via INTRAVENOUS
  Filled 2021-01-17: qty 4

## 2021-01-17 NOTE — TOC Progression Note (Signed)
Transition of Care (TOC) - Progression Note    Patient Details  Name: [name]. MRN: [id number] Date of Birth: 1955-07-06  Transition of Care [facility]) CM/SW Contact  Eileen Stanford, LCSW Phone Number: 01/17/2021, 8:28 AM  Clinical Narrative:   CSW spoke with pt and pt's wife. At this time they are interested in [facility] or [facility].     Expected Discharge Plan: [place] (vs home health) Barriers to Discharge: Continued Medical Work up  Expected Discharge Plan and Services Expected Discharge Plan: [place] (vs home health)     Post Acute Care Choice: [place] arrangements for the past 2 months: Single Family Home                                       Social Determinants of Health (SDOH) Interventions    Readmission Risk Interventions No flowsheet data found.

## 2021-01-17 NOTE — Progress Notes (Signed)
Paged on call at approximately 2145 due to pt c/o "I can't get comfortable and I feel winded" while lying in bed. Pt is currently satting 92-94% on NC4L and apparently was on room air this morning. Spoke with NP Hollace Hayward via secure messaging. A chest xray and ABG was ordered. After these resulted, pt was ordered lasix and strongly encourage to wear bipap due to pco2 of 66 on venous gas. Pt was agreeable to wear bipap and remains on it at this time. Current o2 sats on bipap are 96% and he is resting comfortably.

## 2021-01-17 NOTE — Progress Notes (Signed)
Physical Therapy Treatment Patient Details Name: [name]. MRN: [id number] DOB: 05/16/1955 Today's Date: 01/17/2021   History of Present Illness Per MD Notes: Alec Less. is a 65 year old male with history of hypertension, congestive heart failure, hyperlipidemia, diabetes type 2, chronic ulcer on the left foot presented to the emergency department with complaints of fever, chills.  On presentation he was febrile with temperature of 104, he was tachycardic.  He was found to have worsening ulcer of the left foot.  Patient was hospitalized.  Seen by podiatry and underwent ray amputation.  He also developed atrial fibrillation with RVR during this hospital stay.    PT Comments    Pt alert, seated EOB attempting to transfer bed <> BSC. Pt education provided on transferring only with PT/nursing assist. Current primary limitation is limited mobility while maintaining current NWB status. Pt transfers w/ RW, MIN A for cues and physical assist to prevent WB on LLE. Pt is unable amb w/ RW while maintaining NWB. Trialed a knee scooter with transfers bed > scooter utilizing RW for balance and MIN A for stabilizing equipment. Pt unable to maintain WB status during transfer, but he was able to move forward/backward without LOB. Skilled PT intervention is indicated to address deficits in function, mobility, and to return to PLOF as able.  Discharge recommendations remain SNF to maximize functional mobility while maintaining current NWB status to LLE.   Recommendations for follow up therapy are one component of a multi-disciplinary discharge planning process, led by the attending physician.  Recommendations may be updated based on patient status, additional functional criteria and insurance authorization.  Follow Up Recommendations  SNF     Equipment Recommendations  Wheelchair (measurements PT);Wheelchair cushion (measurements PT)    Recommendations for Other Services        Precautions / Restrictions Precautions Precautions: Fall Restrictions Weight Bearing Restrictions: Yes LLE Weight Bearing: Non weight bearing     Mobility  Bed Mobility               General bed mobility comments: pt seated EOB start of session    Transfers Overall transfer level: Needs assistance Equipment used: Rolling walker (2 wheeled) Transfers: Sit to/from [facility] Sit to Stand: Min assist;From elevated surface Stand pivot transfers: Min assist       General transfer comment: Min A to maintain WB precautions  Ambulation/Gait Ambulation/Gait assistance: Min assist Gait Distance (Feet): 3 Feet Assistive device: Rolling walker (2 wheeled) (knee scooter)       General Gait Details: Pt able to step fwd, back w/ RW bed <> BSC w/ consistent cues for maintaining WB precautions; Pt pivots off of LLE for turning and is unable to maintain NWB status w/ RW. Trialed a knee scooter and pt was able to scoot fwd, back without LOB   Stairs             Wheelchair Mobility    Modified Rankin (Stroke Patients Only)       Balance Overall balance assessment: Needs assistance Sitting-balance support: Single extremity supported Sitting balance-Leahy Scale: Normal     Standing balance support: Bilateral upper extremity supported Standing balance-Leahy Scale: Fair Standing balance comment: requires BUE support or knee scooter to maintain NWB status                            Cognition Arousal/Alertness: Awake/alert Behavior During Therapy: WFL for tasks assessed/performed Overall Cognitive Status: Within  Functional Limits for tasks assessed                                        Exercises Other Exercises Other Exercises: Bed <> BSC transfer, RW w/ MIN A; cues for keeping leg out straight and up with WB precautions; Pt able to perform pericare independently in seated position with PT assist for environment setup     General Comments        Pertinent Vitals/Pain Pain Assessment: Faces Faces Pain Scale: Hurts a little bit Pain Location: LBP Pain Descriptors / Indicators: Discomfort Pain Intervention(s): Limited activity within patient's tolerance;Monitored during session;Repositioned    Home Living                      Prior Function            PT Goals (current goals can now be found in the care plan section) Progress towards PT goals: Progressing toward goals    Frequency    Min 2X/week      PT Plan Current plan remains appropriate    Co-evaluation              AM-PAC PT "6 Clicks" Mobility   Outcome Measure  Help needed turning from your back to your side while in a flat bed without using bedrails?: None Help needed moving from lying on your back to sitting on the side of a flat bed without using bedrails?: None Help needed moving to and from a bed to a chair (including a wheelchair)?: A Little Help needed standing up from a chair using your arms (e.g., wheelchair or bedside chair)?: A Little Help needed to walk in hospital room?: A Lot Help needed climbing 3-5 steps with a railing? : Total 6 Click Score: 17    End of Session Equipment Utilized During Treatment: Gait belt Activity Tolerance: Patient tolerated treatment well Patient left: in chair;with call bell/phone within reach;with chair alarm set;with nursing/sitter in room Nurse Communication: Mobility status PT Visit Diagnosis: Muscle weakness (generalized) (M62.81);Other abnormalities of gait and mobility (R26.89);Pain Pain - Right/Left: Left Pain - part of body: Ankle and joints of foot     Time: 2542-7062 PT Time Calculation (min) (ACUTE ONLY): 44 min  Charges:                        [employer], SPT

## 2021-01-17 NOTE — Progress Notes (Addendum)
[facility] PROGRESS NOTE   Alec A Titterington Jr. JOI:[id number] DOB: 11-Sep-1955 DOA: 01/03/2021  PCP: [name], MD  Brief History/Interval Summary: 65 year old male with history of hypertension, congestive heart failure, hyperlipidemia, diabetes type 2, chronic ulcer on the left foot presented to the emergency department with complaints of fever, chills.  On presentation he was febrile with temperature of 104, he was tachycardic.  He was found to have worsening ulcer of the left foot.  Patient was hospitalized.  Seen by podiatry and underwent ray amputation.  He also developed atrial fibrillation with RVR during this hospital stay.  Consultants: Podiatry.  Cardiology  Procedures: Ray amputation left fifth metatarsal.  Tendo Achilles lengthening on the left.  Wound debridement left plantar fifth metatarsal phalangeal joint  Antibiotics: Anti-infectives (From admission, onward)    Start     Dose/Rate Route Frequency Ordered Stop   01/16/21 1130  amoxicillin-clavulanate (AUGMENTIN) 875-125 MG per tablet 1 tablet        1 tablet Oral Every 12 hours 01/16/21 0910 01/22/21 1129   01/16/21 0000  amoxicillin-clavulanate (AUGMENTIN) 875-125 MG tablet        1 tablet Oral Every 12 hours 01/16/21 0919 01/21/21 2359   01/06/21 1230  Ampicillin-Sulbactam (UNASYN) 3 g in sodium chloride 0.9 % 100 mL IVPB  Status:  Discontinued        3 g 200 mL/hr over 30 Minutes Intravenous Every 6 hours 01/06/21 1130 01/16/21 0910   01/05/21 0600  ceFAZolin (ANCEF) IVPB 2g/100 mL premix        2 g 200 mL/hr over 30 Minutes Intravenous On call to O.R. 01/04/21 1711 01/05/21 0529   01/04/21 1400  cefTRIAXone (ROCEPHIN) 2 g in sodium chloride 0.9 % 100 mL IVPB  Status:  Discontinued        2 g 200 mL/hr over 30 Minutes Intravenous Every 24 hours 01/04/21 0853 01/06/21 1130   01/04/21 1000  vancomycin (VANCOREADY) IVPB 1750 mg/350 mL  Status:  Discontinued        1,750 mg 175 mL/hr over 120 Minutes  Intravenous Every 12 hours 01/03/21 2154 01/04/21 0757   01/04/21 1000  vancomycin (VANCOREADY) IVPB 1500 mg/300 mL  Status:  Discontinued        1,500 mg 150 mL/hr over 120 Minutes Intravenous Every 12 hours 01/04/21 0757 01/04/21 0853   01/03/21 2200  piperacillin-tazobactam (ZOSYN) IVPB 3.375 g  Status:  Discontinued        3.375 g 12.5 mL/hr over 240 Minutes Intravenous Every 8 hours 01/03/21 2154 01/04/21 0853   01/03/21 2100  vancomycin (VANCOREADY) IVPB 500 mg/100 mL        500 mg 100 mL/hr over 60 Minutes Intravenous  Once 01/03/21 1848 01/04/21 0019   01/03/21 1900  ceFEPIme (MAXIPIME) 2 g in sodium chloride 0.9 % 100 mL IVPB        2 g 200 mL/hr over 30 Minutes Intravenous  Once 01/03/21 1845 01/03/21 1925   01/03/21 1845  metroNIDAZOLE (FLAGYL) IVPB 500 mg  Status:  Discontinued        500 mg 100 mL/hr over 60 Minutes Intravenous Every 8 hours 01/03/21 1841 01/04/21 0853   01/03/21 1845  vancomycin (VANCOREADY) IVPB 2000 mg/400 mL        2,000 mg 200 mL/hr over 120 Minutes Intravenous  Once 01/03/21 1845 01/03/21 2248       Subjective/Interval History: Patient mentions that pain in the left foot is well controlled.  Denies any chest  pain shortness of breath.  Overall he feels better.    Assessment/Plan:  Left foot cellulitis with chronic ulcer/streptococcal bacteremia Presented with worsening left foot ulcer.  MRI did not show any signs of osteomyelitis but showed extensive cellulitis and myositis.   Blood cultures positive for group B streptococcus.  Infectious disease was consulted.   Patient underwent amputation of the affected area on 9/13.  He is nonweightbearing on the left lower extremity.  Seen by PT and OT and SNF is recommended for rehabilitation. Transthoracic echocardiogram did not suggest any vegetation. Patient was also seen by vascular surgery during this hospitalization and underwent left lower extremity angiogram.  No significant stenosis was noted.   Patient seems to be stable.  Afebrile.  Pain is well controlled. Patient was on Unasyn.  Patient was transitioned to Augmentin on 19/9, this is to be continued until 9/24.  Acute Pulmonary edema Patient noted to be hypoxic and short of breath overnight.  Chest x-ray raises concern for pulm edema.  Patient had an echocardiogram recently on 9/7 which showed normal systolic function.  Pulmonary edema likely triggered by atrial fibrillation.  He was on oral Lasix.  Given IV Lasix last night.  Will give him another dose this morning.  He denies any chest pain.  We will do an EKG.  Continue to monitor closely.  Hopefully he will turn around quickly and will likely need a higher dose of Lasix at discharge.  Atrial fibrillation with RVR This is a new diagnosis for him.  He was on apixaban prior to admission but was on it for peripheral artery disease.  [facility] has been following him here in the hospital.  Was on a amiodarone infusion which has been changed over to oral.  He was also on diltiazem infusion.  Changed over to oral diltiazem.  He also remains on digoxin and metoprolol.  Digoxin level 0.3. Patient subsequently converted to sinus rhythm.  He seems to be tolerating his long-acting Cardizem, metoprolol and amiodarone and digoxin at this time.      Diabetes mellitus type 2, uncontrolled with hyperglycemia HbA1c 8.9.  Prior to admission patient was on metformin Ozempic Lantus NovoLog.  These can be resumed at discharge.  Currently on glargine insulin and SSI.  CBGs are reasonably well controlled.   Coronary artery disease Stable.  Continue home medications.  Essential hypertension Stable.  Continue medications.  History of BPH Continue Flomax  Morbid obesity Estimated body mass index is 45.78 kg/m as calculated from the following:   Height as of this encounter: 6\' 4"  (1.93 m).   Weight as of this encounter: 170.6 kg.    DVT Prophylaxis: Apixaban Code Status: Full code Family  Communication: Discussed with patient.   Disposition Plan: Seen by PT.  SNF is recommended.  Status is: Inpatient  Remains inpatient appropriate because:IV treatments appropriate due to intensity of illness or inability to take PO and Inpatient level of care appropriate due to severity of illness  Dispo: The patient is from: Home              Anticipated d/c is to:  To be determined              Patient currently is not medically stable to d/c.   Difficult to place patient No      Medications: Scheduled:  amiodarone  200 mg Oral Daily   amoxicillin-clavulanate  1 tablet Oral Q12H   apixaban  5 mg Oral BID   atorvastatin  20 mg Oral QHS   clopidogrel  75 mg Oral Daily   digoxin  0.0625 mg Oral Daily   diltiazem  300 mg Oral Daily   ezetimibe  10 mg Oral QHS   furosemide  40 mg Intravenous Daily   gabapentin  300 mg Oral Daily   insulin aspart  0-15 Units Subcutaneous TID WC   insulin aspart  0-5 Units Subcutaneous QHS   insulin aspart  12 Units Subcutaneous TID WC   insulin glargine-yfgn  50 Units Subcutaneous BID   metoprolol tartrate  25 mg Oral BID   pramipexole  2 mg Oral QHS   primidone  250 mg Oral BID   tamsulosin  0.4 mg Oral Daily   Continuous:   [id number] **OR** acetaminophen, metoprolol tartrate, ondansetron **OR** ondansetron (ZOFRAN) IV, oxyCODONE   Objective:  Vital Signs  Vitals:   01/17/21 0622 01/17/21 0802 01/17/21 0805 01/17/21 1024  BP:  133/78    Pulse:  76  84  Resp:  18    Temp:  97.7 F (36.5 C)    TempSrc:  Oral    SpO2:  90%    Weight: (!) 168.5 kg  (!) 168.2 kg   Height:        Intake/Output Summary (Last 24 hours) at 01/17/2021 1120 Last data filed at 01/17/2021 1047 Gross per 24 hour  Intake 1140 ml  Output 3800 ml  Net -2660 ml    Filed Weights   01/16/21 0828 01/17/21 0622 01/17/21 0805  Weight: (!) 175.2 kg (!) 168.5 kg (!) 168.2 kg    General appearance: Awake alert.  In no distress Resp: Crackles heard  bilateral lungs.  No wheezing or rhonchi. Cardio: S1-S2 is normal regular.  No S3-S4.  No rubs murmurs or bruit GI: Abdomen is soft.  Nontender nondistended.  Bowel sounds are present normal.  No masses organomegaly Extremities: Left leg is in a cast.  Edema noted in right leg. Neurologic: Alert and oriented x3.  No focal neurological deficits.     Lab Results:  Data Reviewed: I have personally reviewed following labs and imaging studies  CBC: Recent Labs  Lab 01/12/21 0922 01/14/21 0411 01/16/21 0357  WBC 8.3 9.0 9.2  HGB 13.1 12.5* 12.6*  HCT 40.3 40.4 39.9  MCV 92.6 93.3 96.1  PLT 246 281 271     Basic Metabolic Panel: Recent Labs  Lab 01/12/21 0922 01/14/21 0411 01/16/21 0357  NA 137 137 139  K 4.3 4.3 4.5  CL 102 102 103  CO2 28 31 29   GLUCOSE 230* 251* 211*  BUN 15 16 19   CREATININE 0.75 0.90 1.11  CALCIUM 8.1* 7.9* 8.3*  MG  --   --  2.2     GFR: Estimated Creatinine Clearance: 112 mL/min (by C-G formula based on SCr of 1.11 mg/dL).     CBG: Recent Labs  Lab 01/16/21 0752 01/16/21 1153 01/16/21 1700 01/16/21 2026 01/17/21 0801  GLUCAP 150* 209* 201* 208* 177*      No results found for this or any previous visit (from the past 240 hour(s)).      Radiology Studies: DG Chest 1 View  Result Date: 01/16/2021 CLINICAL DATA:  Shortness of breath. History of asthma, diabetes, hypertension. EXAM: CHEST  1 VIEW COMPARISON:  01/13/2021 FINDINGS: Postoperative changes in the mediastinum. Cardiac enlargement with pulmonary vascular congestion. Perihilar and basilar interstitial infiltration, likely representing developing interstitial edema. No definite pleural effusion. IMPRESSION: Cardiac enlargement with pulmonary vascular congestion. Developing perihilar and  basilar interstitial edema. Electronically Signed   By: Lucienne Capers M.D.   On: 01/16/2021 22:22       LOS: 14 days   Kristina Bertone [employer] on  [email or web address]  01/17/2021, 11:21 AM

## 2021-01-17 NOTE — Progress Notes (Signed)
7 Days Post-Op   Subjective/Chief Complaint: Patient seen.  Doing well.  No complaints of pain.  Dressings and boot on the right foot have remained in place.   Objective: Vital signs in last 24 hours: Temp:  [97.5 F (36.4 C)-98.7 F (37.1 C)] 98.1 F (36.7 C) (09/20 1202) Pulse Rate:  [59-84] 63 (09/20 1202) Resp:  [13-18] 18 (09/20 1202) BP: (121-157)/(71-88) 134/80 (09/20 1202) SpO2:  [90 %-99 %] 99 % (09/20 1202) Weight:  [168.2 kg-168.5 kg] 168.2 kg (09/20 0805) Last BM Date: 01/16/21  Intake/Output from previous day: 09/19 0701 - 09/20 0700 In: 1480 [P.O.:1380; IV Piggyback:100] Out: 2300 [Urine:2300] Intake/Output this shift: Total I/O In: 240 [P.O.:240] Out: 1500 [Urine:1500]  The bandage is dry and intact with no evidence of any bleeding or drainage.  Upon removal the foot incisions are well coapted with no signs of drainage.  Achilles stab incisions are dry and intact.     Lab Results:  Recent Labs    01/16/21 0357  WBC 9.2  HGB 12.6*  HCT 39.9  PLT 271   BMET Recent Labs    01/16/21 0357  NA 139  K 4.5  CL 103  CO2 29  GLUCOSE 211*  BUN 19  CREATININE 1.11  CALCIUM 8.3*   PT/INR No results for input(s): LABPROT, INR in the last 72 hours. ABG Recent Labs    01/16/21 2227  HCO3 34.0*    Studies/Results: DG Chest 1 View  Result Date: 01/16/2021 CLINICAL DATA:  Shortness of breath. History of asthma, diabetes, hypertension. EXAM: CHEST  1 VIEW COMPARISON:  01/13/2021 FINDINGS: Postoperative changes in the mediastinum. Cardiac enlargement with pulmonary vascular congestion. Perihilar and basilar interstitial infiltration, likely representing developing interstitial edema. No definite pleural effusion. IMPRESSION: Cardiac enlargement with pulmonary vascular congestion. Developing perihilar and basilar interstitial edema. Electronically Signed   By: Lucienne Capers M.D.   On: 01/16/2021 22:22    Anti-infectives: Anti-infectives (From  admission, onward)    Start     Dose/Rate Route Frequency Ordered Stop   01/16/21 1130  amoxicillin-clavulanate (AUGMENTIN) 875-125 MG per tablet 1 tablet        1 tablet Oral Every 12 hours 01/16/21 0910 01/22/21 1129   01/16/21 0000  amoxicillin-clavulanate (AUGMENTIN) 875-125 MG tablet        1 tablet Oral Every 12 hours 01/16/21 0919 01/21/21 2359   01/06/21 1230  Ampicillin-Sulbactam (UNASYN) 3 g in sodium chloride 0.9 % 100 mL IVPB  Status:  Discontinued        3 g 200 mL/hr over 30 Minutes Intravenous Every 6 hours 01/06/21 1130 01/16/21 0910   01/05/21 0600  ceFAZolin (ANCEF) IVPB 2g/100 mL premix        2 g 200 mL/hr over 30 Minutes Intravenous On call to O.R. 01/04/21 1711 01/05/21 0529   01/04/21 1400  cefTRIAXone (ROCEPHIN) 2 g in sodium chloride 0.9 % 100 mL IVPB  Status:  Discontinued        2 g 200 mL/hr over 30 Minutes Intravenous Every 24 hours 01/04/21 0853 01/06/21 1130   01/04/21 1000  vancomycin (VANCOREADY) IVPB 1750 mg/350 mL  Status:  Discontinued        1,750 mg 175 mL/hr over 120 Minutes Intravenous Every 12 hours 01/03/21 2154 01/04/21 0757   01/04/21 1000  vancomycin (VANCOREADY) IVPB 1500 mg/300 mL  Status:  Discontinued        1,500 mg 150 mL/hr over 120 Minutes Intravenous Every 12 hours 01/04/21 0757 01/04/21  1537   01/03/21 2200  piperacillin-tazobactam (ZOSYN) IVPB 3.375 g  Status:  Discontinued        3.375 g 12.5 mL/hr over 240 Minutes Intravenous Every 8 hours 01/03/21 2154 01/04/21 0853   01/03/21 2100  vancomycin (VANCOREADY) IVPB 500 mg/100 mL        500 mg 100 mL/hr over 60 Minutes Intravenous  Once 01/03/21 1848 01/04/21 0019   01/03/21 1900  ceFEPIme (MAXIPIME) 2 g in sodium chloride 0.9 % 100 mL IVPB        2 g 200 mL/hr over 30 Minutes Intravenous  Once 01/03/21 1845 01/03/21 1925   01/03/21 1845  metroNIDAZOLE (FLAGYL) IVPB 500 mg  Status:  Discontinued        500 mg 100 mL/hr over 60 Minutes Intravenous Every 8 hours 01/03/21 1841  01/04/21 0853   01/03/21 1845  vancomycin (VANCOREADY) IVPB 2000 mg/400 mL        2,000 mg 200 mL/hr over 120 Minutes Intravenous  Once 01/03/21 1845 01/03/21 2248       Assessment/Plan: s/p Procedure(s): ACHILLES LENGTHENING/KIDNER (Left) METATARSAL HEAD EXCISION - LEFT 5th (Left) Assessment: Status post excision fifth metatarsal head and Achilles tendon lengthening left foot, stable.  Plan: Betadine applied to the foot incisions followed by gauze dressing to the foot and leg followed by Ace wrap.  Fracture boot reapplied to the left lower extremity.  Patient instructed on nonweightbearing on the left foot.  Patient is stable for discharge as far as podiatry is concerned.  Follow-up in a week and a half with Dr. Luana Shu for reevaluation and suture and staple removal.  LOS: 14 days    Durward Fortes 01/17/2021

## 2021-01-17 NOTE — Progress Notes (Signed)
Pt refuses to wear CPAP tonight. Pt stated that his wife brought his CPAP machine in from home but refuses to wear that one as well. Pt states he is just going to wear oxygen tonight. Made patient aware that if he changes his mind to let his RN know and RT will come back and assist him.

## 2021-01-17 NOTE — Progress Notes (Signed)
Patient complained of Head Ache of 5/10 since 4am this morning. Pt requested Tylenol. Administered at 10:20 am . See MAR.  Assessment of patient's lung sounds: rales in the lower lobes.   Patient is now on IV Lasix   RLE has edema 3+ with dry flaky skin just above and around the ankles.  LLE has a dressing and boot on. Per physician's orders, dressing was not removed/ changed. Skin on LLE was not assessed.  Patient received Insulin this morning with meals. Vital Signs stable.

## 2021-01-18 ENCOUNTER — Ambulatory Visit: Payer: Medicare Other | Admitting: Internal Medicine

## 2021-01-18 DIAGNOSIS — L03116 Cellulitis of left lower limb: Secondary | ICD-10-CM | POA: Diagnosis not present

## 2021-01-18 DIAGNOSIS — R7881 Bacteremia: Secondary | ICD-10-CM | POA: Diagnosis not present

## 2021-01-18 DIAGNOSIS — J810 Acute pulmonary edema: Secondary | ICD-10-CM | POA: Diagnosis not present

## 2021-01-18 LAB — BASIC METABOLIC PANEL
Anion gap: 5 (ref 5–15)
BUN: 22 mg/dL (ref 8–23)
CO2: 33 mmol/L — ABNORMAL HIGH (ref 22–32)
Calcium: 8.4 mg/dL — ABNORMAL LOW (ref 8.9–10.3)
Chloride: 101 mmol/L (ref 98–111)
Creatinine, Ser: 0.88 mg/dL (ref 0.61–1.24)
GFR, Estimated: >60 mL/min (ref >60)
Glucose, Bld: 110 mg/dL — ABNORMAL HIGH (ref 70–99)
Potassium: 4.7 mmol/L (ref 3.5–5.1)
Sodium: 139 mmol/L (ref 135–145)

## 2021-01-18 LAB — MAGNESIUM: Magnesium: 2.2 mg/dL (ref 1.7–2.4)

## 2021-01-18 LAB — CBC
HCT: 39.6 % (ref 39.0–52.0)
Hemoglobin: 12.1 g/dL — ABNORMAL LOW (ref 13.0–17.0)
MCH: 29.3 pg (ref 26.0–34.0)
MCHC: 30.6 g/dL (ref 30.0–36.0)
MCV: 95.9 fL (ref 80.0–100.0)
Platelets: 248 10*3/uL (ref 150–400)
RBC: 4.13 MIL/uL — ABNORMAL LOW (ref 4.22–5.81)
RDW: 13.9 % (ref 11.5–15.5)
WBC: 8.3 10*3/uL (ref 4.0–10.5)
nRBC: 0 % (ref 0.0–0.2)

## 2021-01-18 LAB — GLUCOSE, CAPILLARY
Glucose-Capillary: 67 mg/dL — ABNORMAL LOW (ref 70–99)
Glucose-Capillary: 153 mg/dL — ABNORMAL HIGH (ref 70–99)
Glucose-Capillary: 154 mg/dL — ABNORMAL HIGH (ref 70–99)
Glucose-Capillary: 180 mg/dL — ABNORMAL HIGH (ref 70–99)
Glucose-Capillary: 192 mg/dL — ABNORMAL HIGH (ref 70–99)

## 2021-01-18 MED ORDER — INSULIN GLARGINE-YFGN 100 UNIT/ML ~~LOC~~ SOLN
48.0000 [IU] | Freq: Two times a day (BID) | SUBCUTANEOUS | Status: DC
  Administered 2021-01-18 – 2021-01-19 (×2): 48 [IU] via SUBCUTANEOUS
  Filled 2021-01-18 (×4): qty 0.48

## 2021-01-18 NOTE — Progress Notes (Signed)
SATURATION QUALIFICATIONS: (This note is used to comply with regulatory documentation for home oxygen)  Patient Saturations on Room Air at Rest = 85%  Patient Saturations on Room Air while Ambulating = %  Patient Saturations on  Liters of oxygen while Ambulating = %  Please briefly explain why patient needs home oxygen:patient O2 sat on room air while resting 85%, applied 2L [place] 93%  [name], Tivis Ringer, RN

## 2021-01-18 NOTE — Progress Notes (Signed)
Offered CPAP and educated regarding benefits - pt continues to refuse, says "I have to pee so much that it's hard to wear, I barely get it on and then I have to take it off again. I feel pressure in my lungs when it's on too." Did reiterate that the "pressure" he feels is beneficial for lung perfusion as well as helping alleviate the fluid build up. Also provided reassurance that nursing and respiratory staff are happy to assist him with the mask as needed to accommodate toileting. The patient does have a urinal within reach of the bed but prefers to stand to urinate - "it's hard to see what I'm doing around that mask." Pt states understanding but remains firm that he does not want to wear it tonight. Advised the pt to please let me or RT know if he changes his mind at any time. Pt demonstrates appropriate understanding and use of the call light and has it in reach.

## 2021-01-18 NOTE — Progress Notes (Signed)
Inpatient Diabetes Program Recommendations  AACE/ADA: New Consensus Statement on Inpatient Glycemic Control  Target Ranges:  Prepandial:   less than 140 mg/dL      [facility] postprandial:   less than 180 mg/dL (1-2 hours)      Critically ill patients:  140 - 180 mg/dL   Results for [name], [name]" (MRN [id number]) as of 01/18/2021 10:44  Ref. Range 01/17/2021 08:01 01/17/2021 12:00 01/17/2021 16:12 01/17/2021 21:04 01/18/2021 07:54 01/18/2021 10:05  Glucose-Capillary Latest Ref Range: 70 - 99 mg/dL 177 (H) 230 (H) 257 (H) 180 (H) 67 (L) 153 (H)    Review of Glycemic Control  Diabetes history: DM2 Outpatient Diabetes medications: Lantus 25 units QHS, Novolog 18 units TID with meals, Metformin 1000 mg BID, Ozempic 1 mg Qweek Current orders for Inpatient glycemic control: Semglee 50 units BID, Novolog 12 units TID with meals, Novolog 0-15 units TID with meals, Novolog 0-5 units QHS  Inpatient Diabetes Program Recommendations:    Insulin: Fasting glucose 67 mg/dl today. Please consider decreasing Semglee to 48 units BID.  Thanks, Barnie Alderman, RN, MSN, CDE Diabetes Coordinator Inpatient Diabetes Program [phone] (Team Pager from 8am to 5pm)'

## 2021-01-18 NOTE — TOC Progression Note (Signed)
Transition of Care (TOC) - Progression Note    Patient Details  Name: [name]. MRN: [id number] Date of Birth: [birth year]  Transition of Care [facility]) CM/SW [place], [place] Phone Number: 01/18/2021, 1:01 PM  Clinical Narrative:      CSW updated both patient and spouse that he has a bed tomorrow at [facility], both agreeable.   Patient has been approved by insurance.   MD has been updated and covid test ordered in preparation for dc to [facility] tomorrow.    Expected Discharge Plan: [place] (vs home health) Barriers to Discharge: Continued Medical Work up  Expected Discharge Plan and Services Expected Discharge Plan: [place] (vs home health)     Post Acute Care Choice: [place] arrangements for the past 2 months: Single Family Home                                       Social Determinants of Health (SDOH) Interventions    Readmission Risk Interventions No flowsheet data found.

## 2021-01-18 NOTE — TOC Progression Note (Signed)
Transition of Care (TOC) - Progression Note    Patient Details  Name: [name]. MRN: [id number] Date of Birth: 1955/09/10  Transition of Care [facility]) CM/SW [place], [place] Phone Number: 01/18/2021, 9:10 AM  Clinical Narrative:     CSW called and lvm with patient's [name] regarding bed offers. At this time only bed offer is from [facility]. CSW notes she had preference for [facility] or [place]. CSW did reach out to both facilities 9/20 and has followed up with with [facility] this morning 9/21 pending official acceptance.   CSW has started [employer].       Expected Discharge Plan: [place] (vs home health) Barriers to Discharge: Continued Medical Work up  Expected Discharge Plan and Services Expected Discharge Plan: [place] (vs home health)     Post Acute Care Choice: [place] arrangements for the past 2 months: Single Family Home                                       Social Determinants of Health (SDOH) Interventions    Readmission Risk Interventions No flowsheet data found.

## 2021-01-18 NOTE — Plan of Care (Signed)
  Problem: Education: Goal: Knowledge of General Education information will improve Description Including pain rating scale, medication(s)/side effects and non-pharmacologic comfort measures Outcome: Progressing   Problem: Health Behavior/Discharge Planning: Goal: Ability to manage health-related needs will improve Outcome: Progressing   

## 2021-01-18 NOTE — Progress Notes (Signed)
PROGRESS NOTE    [name].  TMH:[id number] DOB: [birth year] DOA: 01/03/2021 PCP: [name], MD   Assessment & Plan:   Principal Problem:   Sepsis due to cellulitis [facility]) Active Problems:   Diabetes mellitus type 2 in obese ([place])   CAD (coronary artery disease)   CAD (coronary artery disease), autologous vein bypass graft   Pure hypercholesterolemia   RLS (restless legs syndrome)   Chronic osteomyelitis of left foot (HCC)   Osteomyelitis (HCC)   Left foot cellulitis: w/ chronic ulcer/streptococcal bacteremia. MRI did not show any signs of osteomyelitis but showed extensive cellulitis and myositis.  Blood cultures positive for group B streptococcus.  Infectious disease was consulted. Continue on augmentin until 9/24 as per ID  Strep bacteremia: likely secondary to above. Continue on augmentin until 9/24 as per ID. Echo did not show any vegetation   Acute pulmonary edema: continue on IV lasix. Monitor I/Os.    A. fib: w/ RVR. New onset. Continue on amiodarone, eliquis, metoprolol & cardizem     DM2: poorly controlled, HbA1c 8.9. Continue on glargine, SSI w/ accuchecks .   CAD: continue on metoprolol  HTN: continue on metoprolol, cardizem   Hx of BPH: continue on flomax   Morbid obesity: BMI 45.7. Complicates overall care & prognosis     DVT prophylaxis: eliquis  Code Status: full  Family Communication:  Disposition Plan: likely d/c to SNF  Level of care: Progressive Cardiac  Status is: Inpatient  Remains inpatient appropriate because:IV treatments appropriate due to intensity of illness or inability to take PO and Inpatient level of care appropriate due to severity of illness  Dispo: The patient is from: Home              Anticipated d/c is to: SNF              Patient currently is medically stable for d/c    Difficult to place patient : unclear     Consultants:  Podiatry   Procedures:   Antimicrobials: augmentin    Subjective: Pt c/o  malaise   Objective: Vitals:   01/17/21 2318 01/18/21 0344 01/18/21 0408 01/18/21 0750  BP: 116/67 128/73  129/67  Pulse: (!) 54 68  73  Resp: 20 20  20   Temp:  98 F (36.7 C)  97.6 F (36.4 C)  TempSrc:  Oral  Oral  SpO2: 94% 90%  94%  Weight:   (!) 166.2 kg   Height:        Intake/Output Summary (Last 24 hours) at 01/18/2021 0804 Last data filed at 01/18/2021 0348 Gross per 24 hour  Intake 720 ml  Output 4700 ml  Net -3980 ml   Filed Weights   01/17/21 0622 01/17/21 0805 01/18/21 0408  Weight: (!) 168.5 kg (!) 168.2 kg (!) 166.2 kg    Examination:  General exam: Appears calm. Morbidly obese Respiratory system: diminished breath sounds b/l  Cardiovascular system: S1 & S2+. No  rubs, gallops or clicks.  Gastrointestinal system: Abdomen is obese, soft and nontender.  Normal bowel sounds heard. Central nervous system: Alert and oriented. Moves all extremities. Psychiatry: Judgement and insight appear normal. Mood & affect appropriate.     Data Reviewed: I have personally reviewed following labs and imaging studies  CBC: Recent Labs  Lab 01/12/21 0922 01/14/21 0411 01/16/21 0357 01/18/21 0410  WBC 8.3 9.0 9.2 8.3  HGB 13.1 12.5* 12.6* 12.1*  HCT 40.3 40.4 39.9 39.6  MCV 92.6 93.3 96.1  95.9  PLT 246 281 271 024   Basic Metabolic Panel: Recent Labs  Lab 01/12/21 0922 01/14/21 0411 01/16/21 0357 01/18/21 0410  NA 137 137 139 139  K 4.3 4.3 4.5 4.7  CL 102 102 103 101  CO2 28 31 29  33*  GLUCOSE 230* 251* 211* 110*  BUN 15 16 19 22   CREATININE 0.75 0.90 1.11 0.88  CALCIUM 8.1* 7.9* 8.3* 8.4*  MG  --   --  2.2 2.2   GFR: Estimated Creatinine Clearance: 140.4 mL/min (by C-G formula based on SCr of 0.88 mg/dL). Liver Function Tests: No results for input(s): AST, ALT, ALKPHOS, BILITOT, PROT, ALBUMIN in the last 168 hours. No results for input(s): LIPASE, AMYLASE in the last 168 hours. No results for input(s): AMMONIA in the last 168 hours. Coagulation  Profile: No results for input(s): INR, PROTIME in the last 168 hours. Cardiac Enzymes: No results for input(s): CKTOTAL, CKMB, CKMBINDEX, TROPONINI in the last 168 hours. BNP (last 3 results) No results for input(s): PROBNP in the last 8760 hours. HbA1C: No results for input(s): HGBA1C in the last 72 hours. CBG: Recent Labs  Lab 01/17/21 0801 01/17/21 1200 01/17/21 1612 01/17/21 2104 01/18/21 0754  GLUCAP 177* 230* 257* 180* 67*   Lipid Profile: No results for input(s): CHOL, HDL, LDLCALC, TRIG, CHOLHDL, LDLDIRECT in the last 72 hours. Thyroid Function Tests: No results for input(s): TSH, T4TOTAL, FREET4, T3FREE, THYROIDAB in the last 72 hours. Anemia Panel: No results for input(s): VITAMINB12, FOLATE, FERRITIN, TIBC, IRON, RETICCTPCT in the last 72 hours. Sepsis Labs: No results for input(s): PROCALCITON, LATICACIDVEN in the last 168 hours.  No results found for this or any previous visit (from the past 240 hour(s)).       Radiology Studies: DG Chest 1 View  Result Date: 01/16/2021 CLINICAL DATA:  Shortness of breath. History of asthma, diabetes, hypertension. EXAM: CHEST  1 VIEW COMPARISON:  01/13/2021 FINDINGS: Postoperative changes in the mediastinum. Cardiac enlargement with pulmonary vascular congestion. Perihilar and basilar interstitial infiltration, likely representing developing interstitial edema. No definite pleural effusion. IMPRESSION: Cardiac enlargement with pulmonary vascular congestion. Developing perihilar and basilar interstitial edema. Electronically Signed   By: Lucienne Capers M.D.   On: 01/16/2021 22:22        Scheduled Meds:  amiodarone  200 mg Oral Daily   amoxicillin-clavulanate  1 tablet Oral Q12H   apixaban  5 mg Oral BID   atorvastatin  20 mg Oral QHS   clopidogrel  75 mg Oral Daily   digoxin  0.0625 mg Oral Daily   diltiazem  300 mg Oral Daily   ezetimibe  10 mg Oral QHS   furosemide  40 mg Intravenous Daily   gabapentin  300 mg Oral  Daily   insulin aspart  0-15 Units Subcutaneous TID WC   insulin aspart  0-5 Units Subcutaneous QHS   insulin aspart  12 Units Subcutaneous TID WC   insulin glargine-yfgn  50 Units Subcutaneous BID   metoprolol tartrate  25 mg Oral BID   pramipexole  2 mg Oral QHS   primidone  250 mg Oral BID   tamsulosin  0.4 mg Oral Daily   Continuous Infusions:   LOS: 15 days    Time spent: 33 mins     Wyvonnia Dusky, MD [facility] Pager [phone]  If 7PM-7AM, please contact night-coverage 01/18/2021, 8:04 AM

## 2021-01-19 DIAGNOSIS — I509 Heart failure, unspecified: Secondary | ICD-10-CM | POA: Diagnosis not present

## 2021-01-19 DIAGNOSIS — Z736 Limitation of activities due to disability: Secondary | ICD-10-CM | POA: Diagnosis not present

## 2021-01-19 DIAGNOSIS — G4730 Sleep apnea, unspecified: Secondary | ICD-10-CM | POA: Diagnosis not present

## 2021-01-19 DIAGNOSIS — A4189 Other specified sepsis: Secondary | ICD-10-CM | POA: Diagnosis not present

## 2021-01-19 DIAGNOSIS — E1140 Type 2 diabetes mellitus with diabetic neuropathy, unspecified: Secondary | ICD-10-CM | POA: Diagnosis not present

## 2021-01-19 DIAGNOSIS — Z7401 Bed confinement status: Secondary | ICD-10-CM | POA: Diagnosis not present

## 2021-01-19 DIAGNOSIS — Z6841 Body Mass Index (BMI) 40.0 and over, adult: Secondary | ICD-10-CM | POA: Diagnosis not present

## 2021-01-19 DIAGNOSIS — I2510 Atherosclerotic heart disease of native coronary artery without angina pectoris: Secondary | ICD-10-CM | POA: Diagnosis not present

## 2021-01-19 DIAGNOSIS — J810 Acute pulmonary edema: Secondary | ICD-10-CM | POA: Diagnosis not present

## 2021-01-19 DIAGNOSIS — D649 Anemia, unspecified: Secondary | ICD-10-CM | POA: Diagnosis not present

## 2021-01-19 DIAGNOSIS — E119 Type 2 diabetes mellitus without complications: Secondary | ICD-10-CM | POA: Diagnosis not present

## 2021-01-19 DIAGNOSIS — I739 Peripheral vascular disease, unspecified: Secondary | ICD-10-CM | POA: Diagnosis not present

## 2021-01-19 DIAGNOSIS — M86672 Other chronic osteomyelitis, left ankle and foot: Secondary | ICD-10-CM | POA: Diagnosis not present

## 2021-01-19 DIAGNOSIS — I25710 Atherosclerosis of autologous vein coronary artery bypass graft(s) with unstable angina pectoris: Secondary | ICD-10-CM | POA: Diagnosis not present

## 2021-01-19 DIAGNOSIS — I4891 Unspecified atrial fibrillation: Secondary | ICD-10-CM | POA: Diagnosis not present

## 2021-01-19 DIAGNOSIS — R0902 Hypoxemia: Secondary | ICD-10-CM | POA: Diagnosis not present

## 2021-01-19 DIAGNOSIS — I10 Essential (primary) hypertension: Secondary | ICD-10-CM | POA: Diagnosis not present

## 2021-01-19 DIAGNOSIS — Z79899 Other long term (current) drug therapy: Secondary | ICD-10-CM | POA: Diagnosis not present

## 2021-01-19 DIAGNOSIS — G2581 Restless legs syndrome: Secondary | ICD-10-CM | POA: Diagnosis not present

## 2021-01-19 DIAGNOSIS — Z4781 Encounter for orthopedic aftercare following surgical amputation: Secondary | ICD-10-CM | POA: Diagnosis not present

## 2021-01-19 DIAGNOSIS — L03116 Cellulitis of left lower limb: Secondary | ICD-10-CM | POA: Diagnosis not present

## 2021-01-19 DIAGNOSIS — R7881 Bacteremia: Secondary | ICD-10-CM | POA: Diagnosis not present

## 2021-01-19 DIAGNOSIS — N400 Enlarged prostate without lower urinary tract symptoms: Secondary | ICD-10-CM | POA: Diagnosis not present

## 2021-01-19 DIAGNOSIS — Z89422 Acquired absence of other left toe(s): Secondary | ICD-10-CM | POA: Diagnosis not present

## 2021-01-19 DIAGNOSIS — E7800 Pure hypercholesterolemia, unspecified: Secondary | ICD-10-CM | POA: Diagnosis not present

## 2021-01-19 DIAGNOSIS — R2681 Unsteadiness on feet: Secondary | ICD-10-CM | POA: Diagnosis not present

## 2021-01-19 DIAGNOSIS — R52 Pain, unspecified: Secondary | ICD-10-CM | POA: Diagnosis not present

## 2021-01-19 DIAGNOSIS — L03119 Cellulitis of unspecified part of limb: Secondary | ICD-10-CM | POA: Diagnosis not present

## 2021-01-19 DIAGNOSIS — E785 Hyperlipidemia, unspecified: Secondary | ICD-10-CM | POA: Diagnosis not present

## 2021-01-19 DIAGNOSIS — M6281 Muscle weakness (generalized): Secondary | ICD-10-CM | POA: Diagnosis not present

## 2021-01-19 LAB — BASIC METABOLIC PANEL
Anion gap: 4 — ABNORMAL LOW (ref 5–15)
BUN: 24 mg/dL — ABNORMAL HIGH (ref 8–23)
CO2: 33 mmol/L — ABNORMAL HIGH (ref 22–32)
Calcium: 8.3 mg/dL — ABNORMAL LOW (ref 8.9–10.3)
Chloride: 99 mmol/L (ref 98–111)
Creatinine, Ser: 0.8 mg/dL (ref 0.61–1.24)
GFR, Estimated: >60 mL/min (ref >60)
Glucose, Bld: 106 mg/dL — ABNORMAL HIGH (ref 70–99)
Potassium: 4.6 mmol/L (ref 3.5–5.1)
Sodium: 136 mmol/L (ref 135–145)

## 2021-01-19 LAB — CBC
HCT: 35.6 % — ABNORMAL LOW (ref 39.0–52.0)
Hemoglobin: 11 g/dL — ABNORMAL LOW (ref 13.0–17.0)
MCH: 29.1 pg (ref 26.0–34.0)
MCHC: 30.9 g/dL (ref 30.0–36.0)
MCV: 94.2 fL (ref 80.0–100.0)
Platelets: 249 10*3/uL (ref 150–400)
RBC: 3.78 MIL/uL — ABNORMAL LOW (ref 4.22–5.81)
RDW: 14.1 % (ref 11.5–15.5)
WBC: 6.5 10*3/uL (ref 4.0–10.5)
nRBC: 0 % (ref 0.0–0.2)

## 2021-01-19 LAB — GLUCOSE, CAPILLARY
Glucose-Capillary: 128 mg/dL — ABNORMAL HIGH (ref 70–99)
Glucose-Capillary: 105 mg/dL — ABNORMAL HIGH (ref 70–99)

## 2021-01-19 LAB — SARS CORONAVIRUS 2 (TAT 6-24 HRS): SARS Coronavirus 2: NEGATIVE

## 2021-01-19 NOTE — Progress Notes (Signed)
Report called to nurse Margreta Journey at [facility], patient transported [facility]. Discharge packet sent via EMS.Family aware of discharge plan.  [employer], Tivis Ringer, RN

## 2021-01-19 NOTE — Care Management Important Message (Signed)
Important Message  Patient Details  Name: [name]. MRN: [id number] Date of Birth: 1955/09/04   Medicare Important Message Given:  Yes     [name] 01/19/2021, 2:18 PM

## 2021-01-19 NOTE — Plan of Care (Signed)
  Problem: Education: Goal: Knowledge of General Education information will improve Description: Including pain rating scale, medication(s)/side effects and non-pharmacologic comfort measures Outcome: Adequate for Discharge   Problem: Health Behavior/Discharge Planning: Goal: Ability to manage health-related needs will improve Outcome: Adequate for Discharge   Problem: Clinical Measurements: Goal: Ability to maintain clinical measurements within normal limits will improve Outcome: Adequate for Discharge Goal: Will remain free from infection Outcome: Adequate for Discharge Goal: Diagnostic test results will improve Outcome: Adequate for Discharge Goal: Respiratory complications will improve Outcome: Adequate for Discharge Goal: Cardiovascular complication will be avoided Outcome: Adequate for Discharge   Problem: Activity: Goal: Risk for activity intolerance will decrease Outcome: Adequate for Discharge   Problem: Nutrition: Goal: Adequate nutrition will be maintained Outcome: Adequate for Discharge   Problem: Coping: Goal: Level of anxiety will decrease Outcome: Adequate for Discharge   Problem: Elimination: Goal: Will not experience complications related to bowel motility Outcome: Adequate for Discharge Goal: Will not experience complications related to urinary retention Outcome: Adequate for Discharge   Problem: Pain Managment: Goal: General experience of comfort will improve Outcome: Adequate for Discharge   Problem: Safety: Goal: Ability to remain free from injury will improve Outcome: Adequate for Discharge   Problem: Skin Integrity: Goal: Risk for impaired skin integrity will decrease Outcome: Adequate for Discharge   Problem: Acute Rehab OT Goals (only OT should resolve) Goal: Pt. Will Perform Grooming Outcome: Adequate for Discharge Goal: Pt. Will Perform Lower Body Dressing Outcome: Adequate for Discharge Goal: Pt. Will Transfer To Toilet Outcome:  Adequate for Discharge Goal: Pt. Will Perform Toileting-Clothing Manipulation Outcome: Adequate for Discharge   Problem: Acute Rehab PT Goals(only PT should resolve) Goal: Pt Will Go Supine/Side To Sit Outcome: Adequate for Discharge Goal: Pt Will Transfer Bed To Chair/Chair To Bed Outcome: Adequate for Discharge Goal: Pt Will Ambulate Outcome: Adequate for Discharge Goal: Pt Will Go Up/Down Stairs Outcome: Adequate for Discharge

## 2021-01-19 NOTE — Discharge Summary (Addendum)
Physician Discharge Summary  [name]. TML:[id number] DOB: October 08, 1955 DOA: 01/03/2021  PCP: [name], MD  Admit date: 01/03/2021 Discharge date: 01/19/2021  Admitted From: home  Disposition:  SNF  Recommendations for Outpatient Follow-up:  Follow up with PCP in 1-2 weeks F/u w/ podiatry, Dr. Luana Shu, in 1.5 weeks  Home Health: no  Equipment/Devices: 2L [place] (can wean as tolerated)  Discharge Condition: stable  CODE STATUS: full  Diet recommendation: Heart Healthy / Carb Modified  Brief/Interim Summary: HPI was taken from Dr. Dwyane Dee: [name]. is a 65 y.o. male with medical history significant of hypertension, heart failure, hyperlipidemia, diabetes presents with fever and chills.  ED provider discussed patient with his wife patient was outside confused initially had some dyspnea but sats within normal limits.  Patient's COVID and flu are negative.  Patient has chronic bilateral wounds no increase in drainage per patient.  Patient endorses some feeling of cold a week.  Documented fever of 104, repeat 101 heart rate 1 16-1 20s but IV fluids held due to heart failure.  Blood pressures 1 15-1 60s sats within normal limits on room air.  CMP within normal limits except glucose 269 anion gap 9 calcium 8.8 albumin 3.4.  Troponins 11 and 22.  CBC with white count of 11.4 H&H 14 and 43 and platelets 150.  UA positive for protein but no nitrites or leuks.  Status post blood cultures in the ED.  Left foot x-ray showing chronic bone erosion involving the lateral aspect of the head of the fifth metatarsal compatible with osteounchanged since April 31st of this year but new compared to 02/25/2020.  There is also soft tissue swelling status post third digit amputation.  Chest x-ray showing pulmonary vascular congestion.  EKG sinus tach with right bundle branch block.  Patient given Flagyl, vancomycin, Tylenol and cefepime.  No headache or neck pain.  Patient endorsing confusion  earlier today to ED provider.   As per Dr. [place] Pink:  65 year old male with history of hypertension, congestive heart failure, hyperlipidemia, diabetes type 2, chronic ulcer on the left foot presented to the emergency department with complaints of fever, chills.  On presentation he was febrile with temperature of 104, he was tachycardic.  He was found to have worsening ulcer of the left foot.  Patient was hospitalized.  Seen by podiatry and underwent ray amputation.  He also developed atrial fibrillation with RVR during this hospital stay.   As per Dr. Jimmye Norman 9/21-9/22/22: Podiatry cleaned the pt for d/c and to f/u w/ Dr. Luana Shu in 1.5 weeks for suture and staple removal. PT/OT rec SNF and pt was d/c to SNF.    Discharge Diagnoses:  Principal Problem:   Sepsis due to cellulitis [facility]) Active Problems:   Diabetes mellitus type 2 in obese (HCC)   CAD (coronary artery disease)   CAD (coronary artery disease), autologous vein bypass graft   Pure hypercholesterolemia   RLS (restless legs syndrome)   Chronic osteomyelitis of left foot (HCC)   Osteomyelitis (HCC)  Left foot cellulitis: w/ chronic ulcer/streptococcal bacteremia. MRI did not show any signs of osteomyelitis but showed extensive cellulitis and myositis. Non weight bearing of left foot as per podiatry.  Blood cultures positive for group B streptococcus.  Continue on augmentin until 9/24 as per ID  Strep bacteremia: likely secondary to above. Continue on augmentin until 9/24 as per ID. Echo did not show any vegetation   Acute pulmonary edema: continue on lasix. Monitor I/Os.  A. fib: w/ RVR. New onset. Continue on amiodarone, eliquis, metoprolol & cardizem     DM2: poorly controlled, HbA1c 8.9. Continue on insulin and metformin at d/c    CAD: continue on metoprolol  HTN: continue on metoprolol, cardizem   Hx of BPH: continue on flomax   Morbid obesity: BMI 45.7. Complicates overall care & prognosis    Discharge  Instructions  Discharge Instructions     Call MD for:  difficulty breathing, headache or visual disturbances   Complete by: As directed    Call MD for:  extreme fatigue   Complete by: As directed    Call MD for:  persistant dizziness or light-headedness   Complete by: As directed    Call MD for:  persistant nausea and vomiting   Complete by: As directed    Call MD for:  redness, tenderness, or signs of infection (pain, swelling, redness, odor or green/yellow discharge around incision site)   Complete by: As directed    Call MD for:  severe uncontrolled pain   Complete by: As directed    Call MD for:  temperature >100.4   Complete by: As directed    Diet - low sodium heart healthy   Complete by: As directed    Diet Carb Modified   Complete by: As directed    Discharge instructions   Complete by: As directed    Please review instructions on the discharge summary.  You were cared for by a hospitalist during your hospital stay. If you have any questions about your discharge medications or the care you received while you were in the hospital after you are discharged, you can call the unit and asked to speak with the hospitalist on call if the hospitalist that took care of you is not available. Once you are discharged, your primary care physician will handle any further medical issues. Please note that NO REFILLS for any discharge medications will be authorized once you are discharged, as it is imperative that you return to your primary care physician (or establish a relationship with a primary care physician if you do not have one) for your aftercare needs so that they can reassess your need for medications and monitor your lab values. If you do not have a primary care physician, you can call [phone] for a physician referral.   Discharge instructions   Complete by: As directed    F/u w/ PCP in 1 week. F/u w/ podiatry, Dr. Luana Shu, 1.5 weeks.   Discharge wound care:   Complete by: As  directed    Keep the current cast over his left lower extremity.  Please call either Dr. Luana Shu Dr. Vickki Muff or Dr. Cleda Mccreedy at [facility] for further instructions.   Discharge wound care:   Complete by: As directed    Betadine applied to the foot incisions followed by gauze dressing to the foot and leg followed by Ace wrap.  Fracture boot reapplied to the left lower extremity.  Patient instructed on nonweightbearing on the left foot.   Increase activity slowly   Complete by: As directed    Increase activity slowly   Complete by: As directed       Allergies as of 01/19/2021       Reactions   Statins    Other reaction(s): Muscle Pain Causes legs to ache per pt        Medication List     STOP taking these medications    Acidophilus Caps capsule  aspirin EC 81 MG tablet   insulin aspart 100 UNIT/ML injection Commonly known as: novoLOG Replaced by: insulin aspart 100 UNIT/ML injection   isosorbide mononitrate 60 MG 24 hr tablet Commonly known as: IMDUR   losartan 100 MG tablet Commonly known as: COZAAR   oxyCODONE-acetaminophen 5-325 MG tablet Commonly known as: Percocet       TAKE these medications    amiodarone 200 MG tablet Commonly known as: PACERONE Take 1 tablet (200 mg total) by mouth daily.   amoxicillin-clavulanate 875-125 MG tablet Commonly known as: AUGMENTIN Take 1 tablet by mouth every 12 (twelve) hours for 5 days.   apixaban 5 MG Tabs tablet Commonly known as: ELIQUIS Take 1 tablet (5 mg total) by mouth 2 (two) times daily.   atorvastatin 40 MG tablet Commonly known as: LIPITOR Take 1 tablet (40 mg total) by mouth daily.   clopidogrel 75 MG tablet Commonly known as: PLAVIX Take 1 tablet (75 mg total) by mouth daily with breakfast.   Digoxin 62.5 MCG Tabs Take 0.0625 mg by mouth daily.   diltiazem 300 MG 24 hr capsule Commonly known as: CARDIZEM CD Take 1 capsule (300 mg total) by mouth daily.   ezetimibe 10 MG  tablet Commonly known as: ZETIA Take 10 mg by mouth daily.   ferrous sulfate 325 (65 FE) MG tablet Take 325 mg by mouth daily with breakfast.   furosemide 20 MG tablet Commonly known as: LASIX Take 20 mg by mouth daily as needed for fluid or edema.   gabapentin 300 MG capsule Commonly known as: NEURONTIN Take 300 mg by mouth daily.   insulin aspart 100 UNIT/ML injection Commonly known as: novoLOG Inject 9 Units into the skin 3 (three) times daily with meals. Replaces: insulin aspart 100 UNIT/ML injection   insulin lispro 100 UNIT/ML injection Commonly known as: HUMALOG Inject 15-28 Units into the skin 3 (three) times daily with meals.   Lantus SoloStar 100 UNIT/ML Solostar Pen Generic drug: insulin glargine Inject 50 Units into the skin 2 (two) times daily. What changed: how much to take   MEDIUM CHAIN TRIGLYCERIDES PO Take 1,000 mg by mouth daily.   metFORMIN 1000 MG tablet Commonly known as: GLUCOPHAGE Take 1,000 mg by mouth 2 (two) times daily.   metoprolol tartrate 25 MG tablet Commonly known as: LOPRESSOR Take 1 tablet (25 mg total) by mouth 2 (two) times daily. What changed:  medication strength how much to take   multivitamin with minerals tablet Take 1 tablet by mouth daily.   nitroGLYCERIN 0.4 MG SL tablet Commonly known as: NITROSTAT Place 0.4 mg under the tongue daily as needed.   oxyCODONE 5 MG immediate release tablet Commonly known as: Oxy IR/ROXICODONE Take 1 tablet (5 mg total) by mouth every 4 (four) hours as needed for up to 7 days for moderate pain.   Ozempic (0.25 or 0.5 MG/DOSE) 2 MG/1.5ML Sopn Generic drug: Semaglutide(0.25 or 0.5MG /DOS) Inject 0.25 mg into the skin once a week. What changed:  how much to take when to take this   pramipexole 1 MG tablet Commonly known as: MIRAPEX Take 2 mg by mouth at bedtime.   primidone 250 MG tablet Commonly known as: MYSOLINE Take 250 mg by mouth 2 (two) times daily.   tamsulosin 0.4 MG  Caps capsule Commonly known as: FLOMAX Take 0.4 mg by mouth daily.   vitamin B-12 1000 MCG tablet Commonly known as: CYANOCOBALAMIN Take 10,000 mcg by mouth daily. Energy shot   zinc gluconate 50 MG  tablet Take 50 mg by mouth daily.               Discharge Care Instructions  (From admission, onward)           Start     Ordered   01/19/21 0000  Discharge wound care:       Comments: Betadine applied to the foot incisions followed by gauze dressing to the foot and leg followed by Ace wrap.  Fracture boot reapplied to the left lower extremity.  Patient instructed on nonweightbearing on the left foot.   01/19/21 1131   01/16/21 0000  Discharge wound care:       Comments: Keep the current cast over his left lower extremity.  Please call either Dr. Luana Shu Dr. Vickki Muff or Dr. Cleda Mccreedy at [facility] for further instructions.   01/16/21 0919            Follow-up Information     Samara Deist, DPM. Schedule an appointment as soon as possible for a visit in 1 week(s).   Specialty: Podiatry Why: Dr. Luana Shu will be out of office the next 2 weeks, followup with Dr. Vickki Muff or Dr. Cleda Mccreedy next week, call to make appointment Contact information: [place] [address] [phone]         [name], MD Follow up.   Specialty: Internal Medicine Contact information: [place] [address] [phone]         Yolonda Kida, MD. Schedule an appointment as soon as possible for a visit in 2 week(s).   Specialties: Cardiology, Internal Medicine Why: For atrial fibrillation Contact information: [place] [address] [phone]                Allergies  Allergen Reactions   Statins     Other reaction(s): Muscle Pain Causes legs to ache per pt    Consultations: Podiatry  ID   Procedures/Studies: DG Chest 1 View  Result Date: 01/16/2021 CLINICAL  DATA:  Shortness of breath. History of asthma, diabetes, hypertension. EXAM: CHEST  1 VIEW COMPARISON:  01/13/2021 FINDINGS: Postoperative changes in the mediastinum. Cardiac enlargement with pulmonary vascular congestion. Perihilar and basilar interstitial infiltration, likely representing developing interstitial edema. No definite pleural effusion. IMPRESSION: Cardiac enlargement with pulmonary vascular congestion. Developing perihilar and basilar interstitial edema. Electronically Signed   By: Lucienne Capers M.D.   On: 01/16/2021 22:22   MR FOOT LEFT WO CONTRAST  Result Date: 01/04/2021 CLINICAL DATA:  Diabetic foot ulcer. EXAM: MRI OF THE LEFT FOOT WITHOUT CONTRAST TECHNIQUE: Multiplanar, multisequence MR imaging of the left foot was performed. No intravenous contrast was administered. COMPARISON:  Radiographs 01/03/2021. MRI 02/23/2019 FINDINGS: There is a ulceration noted along the plantar lateral aspect of the forefoot near the level of the fifth metatarsal head. No underlying subcutaneous abscess is identified. Diffuse subcutaneous soft tissue swelling/edema/fluid suggesting cellulitis. No soft tissue abscesses are identified for certain without contrast. Diffuse myositis without findings for pyomyositis. Surgical changes related to a prior amputation involving the third digit and third distal metatarsal. No findings suspicious septic arthritis or osteomyelitis. Chronic cystic type degenerative changes are noted in the bases of the third, fourth and fifth metatarsals. The major tendons and ligaments appear grossly intact. IMPRESSION: 1. Soft tissue ulceration along the plantar lateral aspect of the forefoot near the level of the fifth metatarsal head. No underlying subcutaneous abscess. 2. Diffuse cellulitis and myositis. 3. No findings  for septic arthritis or osteomyelitis. Electronically Signed   By: Marijo Sanes M.D.   On: 01/04/2021 05:08   PERIPHERAL VASCULAR CATHETERIZATION  Result Date:  01/04/2021 See surgical note for result.  DG Chest Port 1 View  Result Date: 01/13/2021 CLINICAL DATA:  Hypoxia. EXAM: PORTABLE CHEST 1 VIEW COMPARISON:  January 03, 2021. FINDINGS: Stable cardiomegaly with central pulmonary vascular congestion. Status post coronary bypass graft. Both lungs are clear. The visualized skeletal structures are unremarkable. IMPRESSION: Stable cardiomegaly with central pulmonary vascular congestion. Electronically Signed   By: Marijo Conception M.D.   On: 01/13/2021 16:46   DG Chest Port 1 View  Result Date: 01/03/2021 CLINICAL DATA:  Sepsis.  Evaluate for infiltrate. EXAM: PORTABLE CHEST 1 VIEW COMPARISON:  01/06/2020 FINDINGS: Previous median sternotomy and CABG procedure. Stable cardiomediastinal contours. Pulmonary vascular congestion. No pleural effusion or edema. No airspace densities. IMPRESSION: Pulmonary vascular congestion. Electronically Signed   By: Alec Moors M.D.   On: 01/03/2021 18:02   DG Foot Complete Left  Result Date: 01/10/2021 CLINICAL DATA:  Postop EXAM: LEFT FOOT - COMPLETE 3+ VIEW COMPARISON:  MRI left foot 01/04/2021, left foot 01/03/2021 FINDINGS: Interval proximal fifth digit metatarsal amputation with clean margins. Redemonstration of prior transmetatarsal third digit amputation with clear margins. No cortical erosion or destruction. There is no evidence of fracture or dislocation. There is no evidence of arthropathy or other focal bone abnormality. Subcutaneus soft tissue edema and emphysema overlying the fifth digit likely postsurgical. Subcutaneus soft tissue emphysema overlying the distal posterior ankle with associated skin staples. IMPRESSION: 1. Interval proximal fifth digit metatarsal amputation with clean margins. 2. Prior transmetatarsal third digit amputation with clear margins. 3. Associated subcutaneus soft tissue edema emphysema likely postsurgical. Electronically Signed   By: Iven Finn M.D.   On: 01/10/2021 19:47   DG Foot  Complete Left  Result Date: 01/03/2021 CLINICAL DATA:  Evaluate for osteomyelitis. EXAM: LEFT FOOT - COMPLETE 3+ VIEW COMPARISON:  12/28/2020 FINDINGS: Status post third ray amputation at the level of the head of the third metatarsal bone. Diffuse soft tissue swelling is identified. There is a focal bone erosion involving the lateral aspect of the head of the fifth metatarsal bone. This is a new finding compared with 02/25/2020, but appears similar to 12/28/20. No additional focal bone erosions. No fracture or dislocation. No radio-opaque foreign bodies. IMPRESSION: 1. Chronic bone erosion involving the lateral aspect of the head of the fifth metatarsal bone compatible with osteomyelitis. Unchanged from 12/28/2020 but new compared with 02/25/2020. 2. Diffuse soft tissue swelling. 3. Status post third ray amputation. Electronically Signed   By: Alec Moors M.D.   On: 01/03/2021 19:51   DG Foot Complete Left  Result Date: 12/31/2020 CLINICAL DATA:  Diabetic ulcer EXAM: LEFT FOOT - COMPLETE 3+ VIEW COMPARISON:  Prior radiographs of the left foot 02/25/2020 FINDINGS: Surgical changes of prior partial amputation of the third toe through the mid aspect of the metatarsal. The amputation site appears well healed. Focal soft tissue swelling present throughout the forefoot. A small rounded lucency is present underlying the fifth MTP joint. There is some high attenuation material within the lucency which is nonspecific and indeterminate. No rare fraction or destructive changes of the visualized bones to suggest osteomyelitis. No acute fracture or malalignment. No radiopaque foreign body. IMPRESSION: 1. Soft tissue ulceration along the plantar surface of the fifth MTP joint. Some internal radiodense material may represent a small amount of packing material, or mineralization of the soft  tissues. 2. Surgical changes of prior healed partial amputation of the third toe. 3. Diffuse soft tissue swelling throughout the  forefoot. Electronically Signed   By: Jacqulynn Cadet M.D.   On: 12/31/2020 08:48   ECHOCARDIOGRAM COMPLETE  Result Date: 01/05/2021    ECHOCARDIOGRAM REPORT   Patient Name:   Keeyon Privitera. Date of Exam: 01/04/2021 Medical Rec #:  [id number]             Height:       76.0 in Accession #:    [id number]            Weight:       335.0 lb Date of Birth:  1956-01-21            BSA:          2.758 m Patient Age:    65 years              BP:           130/59 mmHg Patient Gender: M                     HR:           105 bpm. Exam Location:  [facility] Procedure: 2D Echo, Color Doppler, Cardiac Doppler and Intracardiac            Opacification Agent Indications:     R06.00 Dyspnea  History:         Patient has no prior history of Echocardiogram examinations.                  CAD, PVD; Risk Factors:Hypertension, Diabetes, Dyslipidemia and                  Sleep Apnea.  Sonographer:     Charmayne Sheer Referring Phys:  [id number] Jacqlyn Krauss Diagnosing Phys: Donnelly Angelica  Sonographer Comments: Suboptimal apical window and suboptimal subcostal window. Image acquisition challenging due to patient body habitus and Image acquisition challenging due to uncooperative patient. IMPRESSIONS  1. Left ventricular ejection fraction, by estimation, is 60 to 65%. The left ventricle has normal function. The left ventricle has no regional wall motion abnormalities. Still images unable to load. Cannot assess diastolic dysfunction.  2. Right ventricular systolic function is normal. The right ventricular size is normal.  3. The mitral valve is normal in structure. No evidence of mitral valve regurgitation.  4. The aortic valve is grossly normal. Aortic valve regurgitation is not visualized. Mild aortic valve sclerosis is present, with no evidence of aortic valve stenosis.  5. The inferior vena cava is normal in size with greater than 50% respiratory variability, suggesting right atrial pressure of 3 mmHg. FINDINGS  Left Ventricle: Left  ventricular ejection fraction, by estimation, is 60 to 65%. The left ventricle has normal function. The left ventricle has no regional wall motion abnormalities. Definity contrast agent was given IV to delineate the left ventricular  endocardial borders. The left ventricular internal cavity size was normal in size. There is no left ventricular hypertrophy. Still images unable to load. Cannot assess diastolic dysfunction. Right Ventricle: The right ventricular size is normal. No increase in right ventricular wall thickness. Right ventricular systolic function is normal. Left Atrium: Left atrial size was not well visualized. Right Atrium: Right atrial size was not well visualized. Pericardium: There is no evidence of pericardial effusion. Mitral Valve: The mitral valve is normal in structure. No evidence of mitral valve regurgitation. MV peak  gradient, 7.7 mmHg. The mean mitral valve gradient is 5.0 mmHg. Tricuspid Valve: The tricuspid valve is not well visualized. Tricuspid valve regurgitation is not demonstrated. Aortic Valve: The aortic valve is grossly normal. Aortic valve regurgitation is not visualized. Mild aortic valve sclerosis is present, with no evidence of aortic valve stenosis. Aortic valve mean gradient measures 10.0 mmHg. Aortic valve peak gradient measures 16.6 mmHg. Aortic valve area, by VTI measures 4.85 cm. Pulmonic Valve: The pulmonic valve was not well visualized. Pulmonic valve regurgitation is not visualized. Aorta: The aortic root is normal in size and structure. Venous: The inferior vena cava is normal in size with greater than 50% respiratory variability, suggesting right atrial pressure of 3 mmHg. IAS/Shunts: The interatrial septum was not assessed.  LEFT VENTRICLE PLAX 2D LVIDd:         6.10 cm LVIDs:         4.30 cm LV PW:         0.90 cm LV IVS:        1.10 cm LVOT diam:     2.70 cm LV SV:         140 LV SV Index:   51 LVOT Area:     5.73 cm  RIGHT VENTRICLE RV Basal diam:  4.50 cm LEFT  ATRIUM         Index      RIGHT ATRIUM           Index LA diam:    4.80 cm 1.74 cm/m RA Area:     20.30 cm                                RA Volume:   61.00 ml  22.12 ml/m  AORTIC VALVE                    PULMONIC VALVE AV Area (Vmax):    3.82 cm     PV Vmax:       1.26 m/s AV Area (Vmean):   3.46 cm     PV Vmean:      81.600 cm/s AV Area (VTI):     4.85 cm     PV VTI:        0.174 m AV Vmax:           204.00 cm/s  PV Peak grad:  6.4 mmHg AV Vmean:          150.000 cm/s PV Mean grad:  3.0 mmHg AV VTI:            0.288 m AV Peak Grad:      16.6 mmHg AV Mean Grad:      10.0 mmHg LVOT Vmax:         136.00 cm/s LVOT Vmean:        90.700 cm/s LVOT VTI:          0.244 m LVOT/AV VTI ratio: 0.85  AORTA Ao Root diam: 3.60 cm MITRAL VALVE MV Area (PHT): 5.97 cm     SHUNTS MV Area VTI:   3.92 cm     Systemic VTI:  0.24 m MV Peak grad:  7.7 mmHg     Systemic Diam: 2.70 cm MV Mean grad:  5.0 mmHg MV Vmax:       1.39 m/s MV Vmean:      100.0 cm/s MV Decel Time: 127 msec MV E velocity: 128.00 cm/s MV A velocity:  123.00 cm/s MV E/A ratio:  1.04 Donnelly Angelica Electronically signed by Donnelly Angelica Signature Date/Time: 01/05/2021/4:29:09 PM    Final    (Echo, Carotid, EGD, Colonoscopy, ERCP)    Subjective: Pt c/o fatigue    Discharge Exam: Vitals:   01/19/21 0844 01/19/21 1125  BP: (!) 141/67 127/74  Pulse: 74   Resp: 10 14  Temp: 98 F (36.7 C) 98.1 F (36.7 C)  SpO2: 95% 96%   Vitals:   01/19/21 0438 01/19/21 0517 01/19/21 0844 01/19/21 1125  BP: 129/71  (!) 141/67 127/74  Pulse: 64  74   Resp: 15  10 14   Temp: 98.4 F (36.9 C)  98 F (36.7 C) 98.1 F (36.7 C)  TempSrc:   Oral Oral  SpO2: 93%  95% 96%  Weight:  (!) 172.6 kg    Height:        General: Pt is alert, awake, not in acute distress. Morbidly obese Cardiovascular: S1/S2+, no rubs, no gallops Respiratory: diminished breath sounds b/l otherwise clear Abdominal: Soft, NT, obese, bowel sounds + Extremities: no cyanosis    The results of  significant diagnostics from this hospitalization (including imaging, microbiology, ancillary and laboratory) are listed below for reference.     Microbiology: Recent Results (from the past 240 hour(s))  SARS CORONAVIRUS 2 (TAT 6-24 HRS) Nasopharyngeal Nasopharyngeal Swab     Status: None   Collection Time: 01/18/21  6:13 PM   Specimen: Nasopharyngeal Swab  Result Value Ref Range Status   SARS Coronavirus 2 NEGATIVE NEGATIVE Final    Comment: (NOTE) SARS-CoV-2 target nucleic acids are NOT DETECTED.  The SARS-CoV-2 RNA is generally detectable in upper and lower respiratory specimens during the acute phase of infection. Negative results do not preclude SARS-CoV-2 infection, do not rule out co-infections with other pathogens, and should not be used as the sole basis for treatment or other patient management decisions. Negative results must be combined with clinical observations, patient history, and epidemiological information. The expected result is Negative.  Fact Sheet for Patients: SugarRoll.be  Fact Sheet for Healthcare Providers: https://www.woods-mathews.com/  This test is not yet approved or cleared by the [place] FDA and  has been authorized for detection and/or diagnosis of SARS-CoV-2 by FDA under an Emergency Use Authorization (EUA). This EUA will remain  in effect (meaning this test can be used) for the duration of the COVID-19 declaration under Se ction 564(b)(1) of the Act, 21 U.S.C. section 360bbb-3(b)(1), unless the authorization is terminated or revoked sooner.  Performed at [facility], [place] [address], [place], [place] [address]      Labs: BNP (last 3 results) Recent Labs    01/03/21 1726  BNP 768.1*   Basic Metabolic Panel: Recent Labs  Lab 01/14/21 0411 01/16/21 0357 01/18/21 0410 01/19/21 0422  NA 137 139 139 136  K 4.3 4.5 4.7 4.6  CL 102 103 101 99  CO2 31 29 33* 33*  GLUCOSE 251* 211*  110* 106*  BUN 16 19 22  24*  CREATININE 0.90 1.11 0.88 0.80  CALCIUM 7.9* 8.3* 8.4* 8.3*  MG  --  2.2 2.2  --    Liver Function Tests: No results for input(s): AST, ALT, ALKPHOS, BILITOT, PROT, ALBUMIN in the last 168 hours. No results for input(s): LIPASE, AMYLASE in the last 168 hours. No results for input(s): AMMONIA in the last 168 hours. CBC: Recent Labs  Lab 01/14/21 0411 01/16/21 0357 01/18/21 0410 01/19/21 0422  WBC 9.0 9.2 8.3 6.5  HGB 12.5* 12.6* 12.1* 11.0*  HCT 40.4 39.9 39.6 35.6*  MCV 93.3 96.1 95.9 94.2  PLT 281 271 248 249   Cardiac Enzymes: No results for input(s): CKTOTAL, CKMB, CKMBINDEX, TROPONINI in the last 168 hours. BNP: Invalid input(s): POCBNP CBG: Recent Labs  Lab 01/18/21 1217 01/18/21 1544 01/18/21 2137 01/19/21 0817 01/19/21 1130  GLUCAP 154* 180* 192* 128* 105*   D-Dimer No results for input(s): DDIMER in the last 72 hours. Hgb A1c No results for input(s): HGBA1C in the last 72 hours. Lipid Profile No results for input(s): CHOL, HDL, LDLCALC, TRIG, CHOLHDL, LDLDIRECT in the last 72 hours. Thyroid function studies No results for input(s): TSH, T4TOTAL, T3FREE, THYROIDAB in the last 72 hours.  Invalid input(s): FREET3 Anemia work up No results for input(s): VITAMINB12, FOLATE, FERRITIN, TIBC, IRON, RETICCTPCT in the last 72 hours. Urinalysis    Component Value Date/Time   COLORURINE STRAW (A) 01/03/2021 1726   APPEARANCEUR CLEAR 01/03/2021 1726   LABSPEC 1.025 01/03/2021 1726   PHURINE 7.0 01/03/2021 1726   GLUCOSEU 500 (A) 01/03/2021 1726   HGBUR MODERATE (A) 01/03/2021 1726   BILIRUBINUR NEGATIVE 01/03/2021 1726   KETONESUR 40 (A) 01/03/2021 1726   PROTEINUR >300 (A) 01/03/2021 1726   NITRITE NEGATIVE 01/03/2021 1726   LEUKOCYTESUR NEGATIVE 01/03/2021 1726   Sepsis Labs Invalid input(s): PROCALCITONIN,  WBC,  LACTICIDVEN Microbiology Recent Results (from the past 240 hour(s))  SARS CORONAVIRUS 2 (TAT 6-24 HRS)  Nasopharyngeal Nasopharyngeal Swab     Status: None   Collection Time: 01/18/21  6:13 PM   Specimen: Nasopharyngeal Swab  Result Value Ref Range Status   SARS Coronavirus 2 NEGATIVE NEGATIVE Final    Comment: (NOTE) SARS-CoV-2 target nucleic acids are NOT DETECTED.  The SARS-CoV-2 RNA is generally detectable in upper and lower respiratory specimens during the acute phase of infection. Negative results do not preclude SARS-CoV-2 infection, do not rule out co-infections with other pathogens, and should not be used as the sole basis for treatment or other patient management decisions. Negative results must be combined with clinical observations, patient history, and epidemiological information. The expected result is Negative.  Fact Sheet for Patients: SugarRoll.be  Fact Sheet for Healthcare Providers: https://www.woods-mathews.com/  This test is not yet approved or cleared by the [place] FDA and  has been authorized for detection and/or diagnosis of SARS-CoV-2 by FDA under an Emergency Use Authorization (EUA). This EUA will remain  in effect (meaning this test can be used) for the duration of the COVID-19 declaration under Se ction 564(b)(1) of the Act, 21 U.S.C. section 360bbb-3(b)(1), unless the authorization is terminated or revoked sooner.  Performed at [facility], [place] [address] [place], [place] [address]      Time coordinating discharge: Over 30 minutes  SIGNED:   Wyvonnia Dusky, MD  [facility] 01/19/2021, 11:32 AM Pager   If 7PM-7AM, please contact night-coverage

## 2021-01-19 NOTE — TOC Transition Note (Signed)
Transition of Care [facility]) - CM/SW Discharge Note   Patient Details  Name: [name]. MRN: [id number] Date of Birth: Nov 21, 1955  [facility]) CM/SW Contact:  Alberteen Sam, LCSW Phone Number: 01/19/2021, 3:00 PM   Clinical Narrative:     Patient will DC to: [facility] Anticipated DC date: 01/19/21 Family notified: [name] (spouse) Transport by: Johnanna Schneiders  Per MD patient ready for DC to [facility] . RN, patient, patient's family, and facility notified of DC. Discharge Summary sent to facility. RN given number for report   [phone] Room 702. DC packet on chart. Ambulance transport requested for patient.  CSW signing off.  Pricilla Riffle, LCSW    Final next level of care: Skilled Nursing Facility Barriers to Discharge: No Barriers Identified   Patient Goals and CMS Choice Patient states their goals for this hospitalization and ongoing recovery are:: to go home CMS Medicare.gov Compare Post Acute Care list provided to:: Patient Choice offered to / list presented to : Patient  Discharge Placement              Patient chooses bed at: [facility] [place] Patient to be transferred to facility by: [facility] Name of family member notified: spoue Patient and family notified of of transfer: 01/19/21  Discharge Plan and Services     Post Acute Care Choice: [place] Determinants of Health (SDOH) Interventions     Readmission Risk Interventions No flowsheet data found.

## 2021-01-20 DIAGNOSIS — L03119 Cellulitis of unspecified part of limb: Secondary | ICD-10-CM | POA: Diagnosis not present

## 2021-01-20 DIAGNOSIS — I4891 Unspecified atrial fibrillation: Secondary | ICD-10-CM | POA: Diagnosis not present

## 2021-01-20 DIAGNOSIS — A4189 Other specified sepsis: Secondary | ICD-10-CM | POA: Diagnosis not present

## 2021-01-20 DIAGNOSIS — E1140 Type 2 diabetes mellitus with diabetic neuropathy, unspecified: Secondary | ICD-10-CM | POA: Diagnosis not present

## 2021-01-24 ENCOUNTER — Encounter: Payer: Self-pay | Admitting: Podiatry

## 2021-01-27 DIAGNOSIS — I4891 Unspecified atrial fibrillation: Secondary | ICD-10-CM | POA: Diagnosis not present

## 2021-01-27 DIAGNOSIS — L03119 Cellulitis of unspecified part of limb: Secondary | ICD-10-CM | POA: Diagnosis not present

## 2021-01-27 DIAGNOSIS — E1140 Type 2 diabetes mellitus with diabetic neuropathy, unspecified: Secondary | ICD-10-CM | POA: Diagnosis not present

## 2021-01-27 DIAGNOSIS — R52 Pain, unspecified: Secondary | ICD-10-CM | POA: Diagnosis not present

## 2021-01-31 DIAGNOSIS — I10 Essential (primary) hypertension: Secondary | ICD-10-CM | POA: Diagnosis not present

## 2021-01-31 DIAGNOSIS — I4891 Unspecified atrial fibrillation: Secondary | ICD-10-CM | POA: Diagnosis not present

## 2021-01-31 DIAGNOSIS — L03119 Cellulitis of unspecified part of limb: Secondary | ICD-10-CM | POA: Diagnosis not present

## 2021-01-31 DIAGNOSIS — Z89422 Acquired absence of other left toe(s): Secondary | ICD-10-CM | POA: Diagnosis not present

## 2021-02-03 DIAGNOSIS — L03119 Cellulitis of unspecified part of limb: Secondary | ICD-10-CM | POA: Diagnosis not present

## 2021-02-03 DIAGNOSIS — R52 Pain, unspecified: Secondary | ICD-10-CM | POA: Diagnosis not present

## 2021-02-03 DIAGNOSIS — I4891 Unspecified atrial fibrillation: Secondary | ICD-10-CM | POA: Diagnosis not present

## 2021-02-03 DIAGNOSIS — E1140 Type 2 diabetes mellitus with diabetic neuropathy, unspecified: Secondary | ICD-10-CM | POA: Diagnosis not present

## 2021-02-06 DIAGNOSIS — I10 Essential (primary) hypertension: Secondary | ICD-10-CM | POA: Diagnosis not present

## 2021-02-06 DIAGNOSIS — I25710 Atherosclerosis of autologous vein coronary artery bypass graft(s) with unstable angina pectoris: Secondary | ICD-10-CM | POA: Diagnosis not present

## 2021-02-06 DIAGNOSIS — E1140 Type 2 diabetes mellitus with diabetic neuropathy, unspecified: Secondary | ICD-10-CM | POA: Diagnosis not present

## 2021-02-06 DIAGNOSIS — L03119 Cellulitis of unspecified part of limb: Secondary | ICD-10-CM | POA: Diagnosis not present

## 2021-02-06 DIAGNOSIS — I739 Peripheral vascular disease, unspecified: Secondary | ICD-10-CM | POA: Diagnosis not present

## 2021-02-06 DIAGNOSIS — E7800 Pure hypercholesterolemia, unspecified: Secondary | ICD-10-CM | POA: Diagnosis not present

## 2021-02-06 DIAGNOSIS — M6281 Muscle weakness (generalized): Secondary | ICD-10-CM | POA: Diagnosis not present

## 2021-02-07 DIAGNOSIS — L89892 Pressure ulcer of other site, stage 2: Secondary | ICD-10-CM | POA: Diagnosis not present

## 2021-02-07 DIAGNOSIS — Z7901 Long term (current) use of anticoagulants: Secondary | ICD-10-CM | POA: Diagnosis not present

## 2021-02-07 DIAGNOSIS — Z7902 Long term (current) use of antithrombotics/antiplatelets: Secondary | ICD-10-CM | POA: Diagnosis not present

## 2021-02-07 DIAGNOSIS — I252 Old myocardial infarction: Secondary | ICD-10-CM | POA: Diagnosis not present

## 2021-02-07 DIAGNOSIS — Z4781 Encounter for orthopedic aftercare following surgical amputation: Secondary | ICD-10-CM | POA: Diagnosis not present

## 2021-02-07 DIAGNOSIS — M109 Gout, unspecified: Secondary | ICD-10-CM | POA: Diagnosis not present

## 2021-02-07 DIAGNOSIS — I10 Essential (primary) hypertension: Secondary | ICD-10-CM | POA: Diagnosis not present

## 2021-02-07 DIAGNOSIS — M1991 Primary osteoarthritis, unspecified site: Secondary | ICD-10-CM | POA: Diagnosis not present

## 2021-02-07 DIAGNOSIS — E785 Hyperlipidemia, unspecified: Secondary | ICD-10-CM | POA: Diagnosis not present

## 2021-02-07 DIAGNOSIS — F32A Depression, unspecified: Secondary | ICD-10-CM | POA: Diagnosis not present

## 2021-02-07 DIAGNOSIS — G4730 Sleep apnea, unspecified: Secondary | ICD-10-CM | POA: Diagnosis not present

## 2021-02-07 DIAGNOSIS — E1151 Type 2 diabetes mellitus with diabetic peripheral angiopathy without gangrene: Secondary | ICD-10-CM | POA: Diagnosis not present

## 2021-02-07 DIAGNOSIS — I2510 Atherosclerotic heart disease of native coronary artery without angina pectoris: Secondary | ICD-10-CM | POA: Diagnosis not present

## 2021-02-07 DIAGNOSIS — J45909 Unspecified asthma, uncomplicated: Secondary | ICD-10-CM | POA: Diagnosis not present

## 2021-02-07 DIAGNOSIS — I70249 Atherosclerosis of native arteries of left leg with ulceration of unspecified site: Secondary | ICD-10-CM | POA: Diagnosis not present

## 2021-02-09 DIAGNOSIS — G4733 Obstructive sleep apnea (adult) (pediatric): Secondary | ICD-10-CM | POA: Diagnosis not present

## 2021-02-09 DIAGNOSIS — I10 Essential (primary) hypertension: Secondary | ICD-10-CM | POA: Diagnosis not present

## 2021-02-17 ENCOUNTER — Encounter: Payer: Self-pay | Admitting: Podiatry

## 2021-02-20 DIAGNOSIS — L851 Acquired keratosis [keratoderma] palmaris et plantaris: Secondary | ICD-10-CM | POA: Diagnosis not present

## 2021-02-20 DIAGNOSIS — E1142 Type 2 diabetes mellitus with diabetic polyneuropathy: Secondary | ICD-10-CM | POA: Diagnosis not present

## 2021-02-20 DIAGNOSIS — Z89422 Acquired absence of other left toe(s): Secondary | ICD-10-CM | POA: Diagnosis not present

## 2021-03-10 DIAGNOSIS — S80822D Blister (nonthermal), left lower leg, subsequent encounter: Secondary | ICD-10-CM | POA: Diagnosis not present

## 2021-03-10 DIAGNOSIS — Z4800 Encounter for change or removal of nonsurgical wound dressing: Secondary | ICD-10-CM | POA: Diagnosis not present

## 2021-03-10 DIAGNOSIS — E1151 Type 2 diabetes mellitus with diabetic peripheral angiopathy without gangrene: Secondary | ICD-10-CM | POA: Diagnosis not present

## 2021-03-10 DIAGNOSIS — E1142 Type 2 diabetes mellitus with diabetic polyneuropathy: Secondary | ICD-10-CM | POA: Diagnosis not present

## 2021-03-12 DIAGNOSIS — I10 Essential (primary) hypertension: Secondary | ICD-10-CM | POA: Diagnosis not present

## 2021-03-12 DIAGNOSIS — G4733 Obstructive sleep apnea (adult) (pediatric): Secondary | ICD-10-CM | POA: Diagnosis not present

## 2021-03-15 DIAGNOSIS — E1121 Type 2 diabetes mellitus with diabetic nephropathy: Secondary | ICD-10-CM | POA: Diagnosis not present

## 2021-03-15 DIAGNOSIS — I10 Essential (primary) hypertension: Secondary | ICD-10-CM | POA: Diagnosis not present

## 2021-03-15 DIAGNOSIS — Z79899 Other long term (current) drug therapy: Secondary | ICD-10-CM | POA: Diagnosis not present

## 2021-03-15 DIAGNOSIS — E7800 Pure hypercholesterolemia, unspecified: Secondary | ICD-10-CM | POA: Diagnosis not present

## 2021-03-21 DIAGNOSIS — I2510 Atherosclerotic heart disease of native coronary artery without angina pectoris: Secondary | ICD-10-CM | POA: Diagnosis not present

## 2021-03-21 DIAGNOSIS — I110 Hypertensive heart disease with heart failure: Secondary | ICD-10-CM | POA: Diagnosis not present

## 2021-03-21 DIAGNOSIS — E1121 Type 2 diabetes mellitus with diabetic nephropathy: Secondary | ICD-10-CM | POA: Diagnosis not present

## 2021-03-21 DIAGNOSIS — E7800 Pure hypercholesterolemia, unspecified: Secondary | ICD-10-CM | POA: Diagnosis not present

## 2021-03-21 DIAGNOSIS — I25710 Atherosclerosis of autologous vein coronary artery bypass graft(s) with unstable angina pectoris: Secondary | ICD-10-CM | POA: Diagnosis not present

## 2021-03-21 DIAGNOSIS — I10 Essential (primary) hypertension: Secondary | ICD-10-CM | POA: Diagnosis not present

## 2021-04-11 DIAGNOSIS — G4733 Obstructive sleep apnea (adult) (pediatric): Secondary | ICD-10-CM | POA: Diagnosis not present

## 2021-04-11 DIAGNOSIS — I10 Essential (primary) hypertension: Secondary | ICD-10-CM | POA: Diagnosis not present

## 2021-04-28 DIAGNOSIS — E1142 Type 2 diabetes mellitus with diabetic polyneuropathy: Secondary | ICD-10-CM | POA: Diagnosis not present

## 2021-04-28 DIAGNOSIS — E1169 Type 2 diabetes mellitus with other specified complication: Secondary | ICD-10-CM | POA: Diagnosis not present

## 2021-04-28 DIAGNOSIS — Z794 Long term (current) use of insulin: Secondary | ICD-10-CM | POA: Diagnosis not present

## 2021-04-28 DIAGNOSIS — E1159 Type 2 diabetes mellitus with other circulatory complications: Secondary | ICD-10-CM | POA: Diagnosis not present

## 2021-05-08 ENCOUNTER — Other Ambulatory Visit: Payer: Self-pay

## 2021-05-08 ENCOUNTER — Emergency Department: Payer: Medicare Other

## 2021-05-08 ENCOUNTER — Inpatient Hospital Stay
Admission: EM | Admit: 2021-05-08 | Discharge: 2021-05-16 | DRG: 264 | Disposition: A | Discharge status: Home Health | Payer: Medicare Other | Attending: Internal Medicine | Admitting: Internal Medicine

## 2021-05-08 DIAGNOSIS — E1142 Type 2 diabetes mellitus with diabetic polyneuropathy: Secondary | ICD-10-CM | POA: Diagnosis present

## 2021-05-08 DIAGNOSIS — E785 Hyperlipidemia, unspecified: Secondary | ICD-10-CM | POA: Diagnosis present

## 2021-05-08 DIAGNOSIS — L97522 Non-pressure chronic ulcer of other part of left foot with fat layer exposed: Secondary | ICD-10-CM

## 2021-05-08 DIAGNOSIS — L97429 Non-pressure chronic ulcer of left heel and midfoot with unspecified severity: Secondary | ICD-10-CM | POA: Diagnosis not present

## 2021-05-08 DIAGNOSIS — Z6841 Body Mass Index (BMI) 40.0 and over, adult: Secondary | ICD-10-CM | POA: Diagnosis not present

## 2021-05-08 DIAGNOSIS — F32A Depression, unspecified: Secondary | ICD-10-CM | POA: Diagnosis not present

## 2021-05-08 DIAGNOSIS — L97529 Non-pressure chronic ulcer of other part of left foot with unspecified severity: Secondary | ICD-10-CM | POA: Diagnosis not present

## 2021-05-08 DIAGNOSIS — Z955 Presence of coronary angioplasty implant and graft: Secondary | ICD-10-CM

## 2021-05-08 DIAGNOSIS — I2510 Atherosclerotic heart disease of native coronary artery without angina pectoris: Secondary | ICD-10-CM | POA: Diagnosis present

## 2021-05-08 DIAGNOSIS — L97421 Non-pressure chronic ulcer of left heel and midfoot limited to breakdown of skin: Secondary | ICD-10-CM | POA: Diagnosis not present

## 2021-05-08 DIAGNOSIS — I5032 Chronic diastolic (congestive) heart failure: Secondary | ICD-10-CM

## 2021-05-08 DIAGNOSIS — G4733 Obstructive sleep apnea (adult) (pediatric): Secondary | ICD-10-CM | POA: Diagnosis present

## 2021-05-08 DIAGNOSIS — Z20822 Contact with and (suspected) exposure to covid-19: Secondary | ICD-10-CM | POA: Diagnosis not present

## 2021-05-08 DIAGNOSIS — L03116 Cellulitis of left lower limb: Secondary | ICD-10-CM | POA: Diagnosis not present

## 2021-05-08 DIAGNOSIS — E11622 Type 2 diabetes mellitus with other skin ulcer: Secondary | ICD-10-CM | POA: Diagnosis present

## 2021-05-08 DIAGNOSIS — I4891 Unspecified atrial fibrillation: Secondary | ICD-10-CM | POA: Insufficient documentation

## 2021-05-08 DIAGNOSIS — J9601 Acute respiratory failure with hypoxia: Secondary | ICD-10-CM | POA: Diagnosis present

## 2021-05-08 DIAGNOSIS — L97829 Non-pressure chronic ulcer of other part of left lower leg with unspecified severity: Secondary | ICD-10-CM | POA: Diagnosis present

## 2021-05-08 DIAGNOSIS — N400 Enlarged prostate without lower urinary tract symptoms: Secondary | ICD-10-CM

## 2021-05-08 DIAGNOSIS — Z91199 Patient's noncompliance with other medical treatment and regimen due to unspecified reason: Secondary | ICD-10-CM

## 2021-05-08 DIAGNOSIS — Z89421 Acquired absence of other right toe(s): Secondary | ICD-10-CM

## 2021-05-08 DIAGNOSIS — Z794 Long term (current) use of insulin: Secondary | ICD-10-CM

## 2021-05-08 DIAGNOSIS — I739 Peripheral vascular disease, unspecified: Secondary | ICD-10-CM | POA: Diagnosis present

## 2021-05-08 DIAGNOSIS — Z7902 Long term (current) use of antithrombotics/antiplatelets: Secondary | ICD-10-CM

## 2021-05-08 DIAGNOSIS — I4892 Unspecified atrial flutter: Secondary | ICD-10-CM | POA: Diagnosis not present

## 2021-05-08 DIAGNOSIS — R600 Localized edema: Secondary | ICD-10-CM | POA: Diagnosis not present

## 2021-05-08 DIAGNOSIS — A419 Sepsis, unspecified organism: Secondary | ICD-10-CM | POA: Diagnosis not present

## 2021-05-08 DIAGNOSIS — G4730 Sleep apnea, unspecified: Secondary | ICD-10-CM | POA: Diagnosis present

## 2021-05-08 DIAGNOSIS — L97219 Non-pressure chronic ulcer of right calf with unspecified severity: Secondary | ICD-10-CM | POA: Diagnosis present

## 2021-05-08 DIAGNOSIS — I480 Paroxysmal atrial fibrillation: Secondary | ICD-10-CM | POA: Diagnosis present

## 2021-05-08 DIAGNOSIS — M109 Gout, unspecified: Secondary | ICD-10-CM | POA: Diagnosis present

## 2021-05-08 DIAGNOSIS — S91302A Unspecified open wound, left foot, initial encounter: Secondary | ICD-10-CM | POA: Diagnosis not present

## 2021-05-08 DIAGNOSIS — I25710 Atherosclerosis of autologous vein coronary artery bypass graft(s) with unstable angina pectoris: Secondary | ICD-10-CM | POA: Diagnosis not present

## 2021-05-08 DIAGNOSIS — E11621 Type 2 diabetes mellitus with foot ulcer: Secondary | ICD-10-CM | POA: Diagnosis not present

## 2021-05-08 DIAGNOSIS — J811 Chronic pulmonary edema: Secondary | ICD-10-CM | POA: Diagnosis not present

## 2021-05-08 DIAGNOSIS — I110 Hypertensive heart disease with heart failure: Secondary | ICD-10-CM | POA: Diagnosis not present

## 2021-05-08 DIAGNOSIS — I878 Other specified disorders of veins: Secondary | ICD-10-CM | POA: Diagnosis present

## 2021-05-08 DIAGNOSIS — E1152 Type 2 diabetes mellitus with diabetic peripheral angiopathy with gangrene: Secondary | ICD-10-CM | POA: Diagnosis not present

## 2021-05-08 DIAGNOSIS — I25810 Atherosclerosis of coronary artery bypass graft(s) without angina pectoris: Secondary | ICD-10-CM | POA: Diagnosis not present

## 2021-05-08 DIAGNOSIS — G2581 Restless legs syndrome: Secondary | ICD-10-CM | POA: Diagnosis present

## 2021-05-08 DIAGNOSIS — I509 Heart failure, unspecified: Principal | ICD-10-CM

## 2021-05-08 DIAGNOSIS — I10 Essential (primary) hypertension: Secondary | ICD-10-CM | POA: Diagnosis not present

## 2021-05-08 DIAGNOSIS — I5033 Acute on chronic diastolic (congestive) heart failure: Secondary | ICD-10-CM | POA: Diagnosis present

## 2021-05-08 DIAGNOSIS — R601 Generalized edema: Secondary | ICD-10-CM | POA: Diagnosis not present

## 2021-05-08 DIAGNOSIS — Z951 Presence of aortocoronary bypass graft: Secondary | ICD-10-CM

## 2021-05-08 DIAGNOSIS — E782 Mixed hyperlipidemia: Secondary | ICD-10-CM | POA: Diagnosis not present

## 2021-05-08 DIAGNOSIS — Z7901 Long term (current) use of anticoagulants: Secondary | ICD-10-CM

## 2021-05-08 DIAGNOSIS — Z79899 Other long term (current) drug therapy: Secondary | ICD-10-CM

## 2021-05-08 DIAGNOSIS — I483 Typical atrial flutter: Secondary | ICD-10-CM | POA: Diagnosis not present

## 2021-05-08 DIAGNOSIS — M79604 Pain in right leg: Secondary | ICD-10-CM | POA: Diagnosis not present

## 2021-05-08 DIAGNOSIS — I96 Gangrene, not elsewhere classified: Secondary | ICD-10-CM | POA: Diagnosis not present

## 2021-05-08 DIAGNOSIS — E119 Type 2 diabetes mellitus without complications: Secondary | ICD-10-CM

## 2021-05-08 DIAGNOSIS — L97519 Non-pressure chronic ulcer of other part of right foot with unspecified severity: Secondary | ICD-10-CM | POA: Diagnosis present

## 2021-05-08 DIAGNOSIS — E7800 Pure hypercholesterolemia, unspecified: Secondary | ICD-10-CM | POA: Diagnosis not present

## 2021-05-08 DIAGNOSIS — R251 Tremor, unspecified: Secondary | ICD-10-CM | POA: Diagnosis present

## 2021-05-08 DIAGNOSIS — G4710 Hypersomnia, unspecified: Secondary | ICD-10-CM | POA: Diagnosis present

## 2021-05-08 DIAGNOSIS — I890 Lymphedema, not elsewhere classified: Secondary | ICD-10-CM | POA: Diagnosis present

## 2021-05-08 DIAGNOSIS — L97909 Non-pressure chronic ulcer of unspecified part of unspecified lower leg with unspecified severity: Secondary | ICD-10-CM | POA: Insufficient documentation

## 2021-05-08 DIAGNOSIS — Z7984 Long term (current) use of oral hypoglycemic drugs: Secondary | ICD-10-CM

## 2021-05-08 DIAGNOSIS — L97511 Non-pressure chronic ulcer of other part of right foot limited to breakdown of skin: Secondary | ICD-10-CM | POA: Diagnosis not present

## 2021-05-08 DIAGNOSIS — R000 Tachycardia, unspecified: Secondary | ICD-10-CM | POA: Insufficient documentation

## 2021-05-08 DIAGNOSIS — L03115 Cellulitis of right lower limb: Secondary | ICD-10-CM | POA: Diagnosis present

## 2021-05-08 DIAGNOSIS — L97422 Non-pressure chronic ulcer of left heel and midfoot with fat layer exposed: Secondary | ICD-10-CM | POA: Diagnosis not present

## 2021-05-08 DIAGNOSIS — E1165 Type 2 diabetes mellitus with hyperglycemia: Secondary | ICD-10-CM | POA: Diagnosis present

## 2021-05-08 DIAGNOSIS — I5031 Acute diastolic (congestive) heart failure: Secondary | ICD-10-CM | POA: Diagnosis not present

## 2021-05-08 DIAGNOSIS — F17290 Nicotine dependence, other tobacco product, uncomplicated: Secondary | ICD-10-CM | POA: Diagnosis present

## 2021-05-08 DIAGNOSIS — I517 Cardiomegaly: Secondary | ICD-10-CM | POA: Diagnosis not present

## 2021-05-08 DIAGNOSIS — I83009 Varicose veins of unspecified lower extremity with ulcer of unspecified site: Secondary | ICD-10-CM | POA: Insufficient documentation

## 2021-05-08 DIAGNOSIS — M7989 Other specified soft tissue disorders: Secondary | ICD-10-CM | POA: Diagnosis not present

## 2021-05-08 DIAGNOSIS — L97512 Non-pressure chronic ulcer of other part of right foot with fat layer exposed: Secondary | ICD-10-CM | POA: Diagnosis not present

## 2021-05-08 DIAGNOSIS — Z89422 Acquired absence of other left toe(s): Secondary | ICD-10-CM

## 2021-05-08 DIAGNOSIS — Z7982 Long term (current) use of aspirin: Secondary | ICD-10-CM

## 2021-05-08 DIAGNOSIS — I252 Old myocardial infarction: Secondary | ICD-10-CM

## 2021-05-08 LAB — CBC WITH DIFFERENTIAL/PLATELET
Abs Immature Granulocytes: 0.04 10*3/uL (ref 0.00–0.07)
Basophils Absolute: 0.1 10*3/uL (ref 0.0–0.1)
Basophils Relative: 1 %
Eosinophils Absolute: 0.2 10*3/uL (ref 0.0–0.5)
Eosinophils Relative: 3 %
HCT: 39.2 % (ref 39.0–52.0)
Hemoglobin: 11.7 g/dL — ABNORMAL LOW (ref 13.0–17.0)
Immature Granulocytes: 1 %
Lymphocytes Relative: 14 %
Lymphs Abs: 1.3 10*3/uL (ref 0.7–4.0)
MCH: 28.5 pg (ref 26.0–34.0)
MCHC: 29.8 g/dL — ABNORMAL LOW (ref 30.0–36.0)
MCV: 95.6 fL (ref 80.0–100.0)
Monocytes Absolute: 0.6 10*3/uL (ref 0.1–1.0)
Monocytes Relative: 7 %
Neutro Abs: 6.6 10*3/uL (ref 1.7–7.7)
Neutrophils Relative %: 74 %
Platelets: 289 10*3/uL (ref 150–400)
RBC: 4.1 MIL/uL — ABNORMAL LOW (ref 4.22–5.81)
RDW: 14.9 % (ref 11.5–15.5)
WBC: 8.8 10*3/uL (ref 4.0–10.5)
nRBC: 0 % (ref 0.0–0.2)

## 2021-05-08 LAB — COMPREHENSIVE METABOLIC PANEL WITH GFR
ALT: 31 U/L (ref 0–44)
AST: 27 U/L (ref 15–41)
Albumin: 2.9 g/dL — ABNORMAL LOW (ref 3.5–5.0)
Alkaline Phosphatase: 197 U/L — ABNORMAL HIGH (ref 38–126)
Anion gap: 10 (ref 5–15)
BUN: 20 mg/dL (ref 8–23)
CO2: 32 mmol/L (ref 22–32)
Calcium: 8.2 mg/dL — ABNORMAL LOW (ref 8.9–10.3)
Chloride: 97 mmol/L — ABNORMAL LOW (ref 98–111)
Creatinine, Ser: 1.02 mg/dL (ref 0.61–1.24)
GFR, Estimated: >60 mL/min (ref >60)
Glucose, Bld: 209 mg/dL — ABNORMAL HIGH (ref 70–99)
Potassium: 5.1 mmol/L (ref 3.5–5.1)
Sodium: 139 mmol/L (ref 135–145)
Total Bilirubin: 0.8 mg/dL (ref 0.3–1.2)
Total Protein: 7.3 g/dL (ref 6.5–8.1)

## 2021-05-08 LAB — TYPE AND SCREEN
ABO/RH(D): O NEG
Antibody Screen: NEGATIVE

## 2021-05-08 LAB — URINALYSIS, ROUTINE W REFLEX MICROSCOPIC
Bacteria, UA: NONE SEEN
Bilirubin Urine: NEGATIVE
Glucose, UA: 150 mg/dL — AB
Hgb urine dipstick: NEGATIVE
Ketones, ur: 5 mg/dL — AB
Leukocytes,Ua: NEGATIVE
Nitrite: NEGATIVE
Protein, ur: >=300 mg/dL — AB
Specific Gravity, Urine: 1.026 (ref 1.005–1.030)
pH: 7 (ref 5.0–8.0)

## 2021-05-08 LAB — PROTIME-INR
INR: 1.2 (ref 0.8–1.2)
Prothrombin Time: 14.8 seconds (ref 11.4–15.2)

## 2021-05-08 LAB — LACTIC ACID, PLASMA
Lactic Acid, Venous: 2.3 mmol/L (ref 0.5–1.9)
Lactic Acid, Venous: 1.5 mmol/L (ref 0.5–1.9)

## 2021-05-08 MED ORDER — FUROSEMIDE 10 MG/ML IJ SOLN
40.0000 mg | Freq: Once | INTRAMUSCULAR | Status: AC
  Administered 2021-05-08: 40 mg via INTRAVENOUS
  Filled 2021-05-08: qty 4

## 2021-05-08 MED ORDER — ACETAMINOPHEN 325 MG PO TABS
650.0000 mg | ORAL_TABLET | ORAL | Status: DC | PRN
  Administered 2021-05-13 – 2021-05-14 (×2): 650 mg via ORAL
  Filled 2021-05-08 (×2): qty 2

## 2021-05-08 MED ORDER — HYDROCODONE-ACETAMINOPHEN 5-325 MG PO TABS
1.0000 | ORAL_TABLET | Freq: Once | ORAL | Status: AC
  Administered 2021-05-08: 1 via ORAL
  Filled 2021-05-08: qty 1

## 2021-05-08 MED ORDER — FUROSEMIDE 10 MG/ML IJ SOLN
40.0000 mg | Freq: Two times a day (BID) | INTRAMUSCULAR | Status: DC
  Administered 2021-05-09: 40 mg via INTRAVENOUS
  Filled 2021-05-08: qty 4

## 2021-05-08 MED ORDER — VANCOMYCIN HCL IN DEXTROSE 1-5 GM/200ML-% IV SOLN
1000.0000 mg | Freq: Once | INTRAVENOUS | Status: AC
  Administered 2021-05-09: 1000 mg via INTRAVENOUS
  Filled 2021-05-08: qty 200

## 2021-05-08 MED ORDER — METOPROLOL TARTRATE 5 MG/5ML IV SOLN
5.0000 mg | Freq: Once | INTRAVENOUS | Status: AC
  Administered 2021-05-08: 5 mg via INTRAVENOUS
  Filled 2021-05-08: qty 5

## 2021-05-08 MED ORDER — OXYCODONE HCL 5 MG PO TABS
10.0000 mg | ORAL_TABLET | Freq: Once | ORAL | Status: AC
  Administered 2021-05-08: 10 mg via ORAL
  Filled 2021-05-08: qty 2

## 2021-05-08 MED ORDER — SODIUM CHLORIDE 0.9% FLUSH: 3.0000 mL | INTRAVENOUS | Status: DC | PRN

## 2021-05-08 MED ORDER — NITROGLYCERIN 0.4 MG SL SUBL: 0.4000 mg | SUBLINGUAL_TABLET | SUBLINGUAL | Status: DC | PRN

## 2021-05-08 MED ORDER — SODIUM CHLORIDE 0.9 % IV SOLN
2.0000 g | Freq: Once | INTRAVENOUS | Status: AC
  Administered 2021-05-08: 2 g via INTRAVENOUS
  Filled 2021-05-08: qty 2

## 2021-05-08 MED ORDER — SODIUM CHLORIDE 0.9% FLUSH
3.0000 mL | Freq: Two times a day (BID) | INTRAVENOUS | Status: DC
  Administered 2021-05-09 – 2021-05-16 (×15): 3 mL via INTRAVENOUS

## 2021-05-08 MED ORDER — METOPROLOL TARTRATE 25 MG PO TABS
25.0000 mg | ORAL_TABLET | Freq: Two times a day (BID) | ORAL | Status: DC
  Administered 2021-05-09 (×2): 25 mg via ORAL
  Filled 2021-05-08 (×2): qty 1

## 2021-05-08 MED ORDER — INSULIN ASPART 100 UNIT/ML IJ SOLN
0.0000 [IU] | Freq: Three times a day (TID) | INTRAMUSCULAR | Status: DC
  Administered 2021-05-09 – 2021-05-10 (×3): 4 [IU] via SUBCUTANEOUS
  Administered 2021-05-10: 11 [IU] via SUBCUTANEOUS
  Administered 2021-05-10: 4 [IU] via SUBCUTANEOUS
  Administered 2021-05-11: 11 [IU] via SUBCUTANEOUS
  Administered 2021-05-11: 15 [IU] via SUBCUTANEOUS
  Administered 2021-05-11: 11 [IU] via SUBCUTANEOUS
  Administered 2021-05-12: 7 [IU] via SUBCUTANEOUS
  Administered 2021-05-12 – 2021-05-13 (×2): 3 [IU] via SUBCUTANEOUS
  Administered 2021-05-13 – 2021-05-14 (×4): 7 [IU] via SUBCUTANEOUS
  Administered 2021-05-15: 4 [IU] via SUBCUTANEOUS
  Administered 2021-05-16: 3 [IU] via SUBCUTANEOUS
  Filled 2021-05-08 (×15): qty 1

## 2021-05-08 MED ORDER — ONDANSETRON 4 MG PO TBDP
4.0000 mg | ORAL_TABLET | Freq: Once | ORAL | Status: AC
  Administered 2021-05-08: 4 mg via ORAL
  Filled 2021-05-08: qty 1

## 2021-05-08 MED ORDER — SODIUM CHLORIDE 0.9 % IV SOLN
250.0000 mL | INTRAVENOUS | Status: DC | PRN
  Administered 2021-05-10 – 2021-05-11 (×2): 250 mL via INTRAVENOUS

## 2021-05-08 MED ORDER — TAMSULOSIN HCL 0.4 MG PO CAPS
0.4000 mg | ORAL_CAPSULE | Freq: Every day | ORAL | Status: DC
Start: 2021-05-09 — End: 2021-05-16
  Administered 2021-05-09 – 2021-05-16 (×8): 0.4 mg via ORAL
  Filled 2021-05-08 (×8): qty 1

## 2021-05-08 MED ORDER — ONDANSETRON HCL 4 MG/2ML IJ SOLN: 4.0000 mg | Freq: Four times a day (QID) | INTRAMUSCULAR | Status: DC | PRN

## 2021-05-08 MED ORDER — ENOXAPARIN SODIUM 80 MG/0.8ML IJ SOSY
0.5000 mg/kg | PREFILLED_SYRINGE | INTRAMUSCULAR | Status: DC
  Administered 2021-05-09: 80 mg via SUBCUTANEOUS
  Filled 2021-05-08: qty 0.8

## 2021-05-08 MED ORDER — INSULIN ASPART 100 UNIT/ML IJ SOLN
0.0000 [IU] | Freq: Every day | INTRAMUSCULAR | Status: DC
  Administered 2021-05-09 – 2021-05-11 (×3): 4 [IU] via SUBCUTANEOUS
  Administered 2021-05-12: 2 [IU] via SUBCUTANEOUS
  Administered 2021-05-13: 4 [IU] via SUBCUTANEOUS
  Administered 2021-05-15: 2 [IU] via SUBCUTANEOUS
  Filled 2021-05-08 (×6): qty 1

## 2021-05-08 MED ORDER — INSULIN GLARGINE 100 UNIT/ML ~~LOC~~ SOLN
50.0000 [IU] | Freq: Two times a day (BID) | SUBCUTANEOUS | Status: DC
  Filled 2021-05-08: qty 0.5

## 2021-05-08 MED ORDER — VANCOMYCIN HCL 1500 MG/300ML IV SOLN
1500.0000 mg | Freq: Once | INTRAVENOUS | Status: AC
  Administered 2021-05-09: 1500 mg via INTRAVENOUS
  Filled 2021-05-08: qty 300

## 2021-05-08 NOTE — Progress Notes (Signed)
PHARMACIST - PHYSICIAN COMMUNICATION  CONCERNING:  Enoxaparin (Lovenox) for DVT Prophylaxis    RECOMMENDATION: Patient was prescribed enoxaprin 40mg  q24 hours for VTE prophylaxis.   Filed Weights   05/08/21 1140  Weight: (!) 158.8 kg (350 lb)    Body mass index is 42.6 kg/m.  Estimated Creatinine Clearance: 116.5 mL/min (by C-G formula based on SCr of 1.02 mg/dL).   Based on [place] patient is candidate for enoxaparin 0.5mg /kg TBW SQ every 24 hours based on BMI being >30.  DESCRIPTION: Pharmacy has adjusted enoxaparin dose per [facility] policy.  Patient is now receiving enoxaparin 0.5 mg/kg every 24 hours   Renda Rolls, PharmD, [name] 05/08/2021 11:57 PM

## 2021-05-08 NOTE — ED Provider Triage Note (Signed)
Emergency Medicine Provider Triage Evaluation Note  [name]. , a 66 y.o. male  was evaluated in triage.  Pt complains of bilateral lower extremity swelling and rapid hear rate. For the past several days, his legs have been draining. He has gained about 40lb of fluid over the past 6 week. He reports being compliant with his medications.  Review of Systems  Positive: Leg pain and swelling, shortness of breath Negative: Chest pain  Physical Exam  BP (!) 142/98 (BP Location: Left Arm)    Pulse (!) 124    Temp 98.5 F (36.9 C) (Oral)    Resp 18    Ht 6\' 4"  (1.93 m)    Wt (!) 158.8 kg    SpO2 94%    BMI 42.60 kg/m  Gen:   Awake, no distress   Resp:  Normal effort  MSK:   Moves extremities without difficulty  Other:    Medical Decision Making  Medically screening exam initiated at 11:46 AM.  Appropriate orders placed.  [name]. was informed that the remainder of the evaluation will be completed by another provider, this initial triage assessment does not replace that evaluation, and the importance of remaining in the ED until their evaluation is complete.    [name], FNP 05/08/21 1158

## 2021-05-08 NOTE — ED Provider Notes (Signed)
[facility] Provider Note    Event Date/Time   First MD Initiated Contact with Patient 05/08/21 2052     (approximate)   History   Leg Swelling and Tachycardia   HPI  [name]. is a 66 y.o. male  here with weakness, leg swelling, tachycardia. Pt primary complaint is b/l leg edema, swelling, wounds, and SOB. Pt reports that he has had worsening leg swelling in both legs for "a few weeks" but particularly the last week. He has begun having weeping drainage from both legs, has hd to sleep in a chair due to the fluid "pouring" from his legs. Reports he has now had an ulcer open on his left heel and right toe, and this has been more painful. He has also had SOB with exertion, orthopnea, and cough. Feels like his abdomen has been swelling as well. He has had difficulty getting around the house which is why he came in today. No known fevers. He has ben taking his medications and lasix as prescribed. He believes he is several pounds heavier than his normal weight.       Physical Exam   Triage Vital Signs: ED Triage Vitals  Enc Vitals Group     BP 05/08/21 1139 (!) 142/98     Pulse Rate 05/08/21 1139 (!) 124     Resp 05/08/21 1139 18     Temp 05/08/21 1139 98.5 F (36.9 C)     Temp Source 05/08/21 1139 Oral     SpO2 05/08/21 1139 94 %     Weight 05/08/21 1140 (!) 350 lb (158.8 kg)     Height 05/08/21 1140 6\' 4"  (1.93 m)     Head Circumference --      [facility] --      Pain Score 05/08/21 1140 10     Pain Loc --      Pain Edu? --      Excl. in [place]? --     Most recent vital signs: Vitals:   05/08/21 2245 05/08/21 2300  BP: (!) 134/96 132/90  Pulse: (!) 123 (!) 121  Resp: (!) 21 (!) 21  Temp:    SpO2: 98% 97%     General: Awake, no distress.  CV:  Good peripheral perfusion. Tachycardia, no murmurs. 3+ pitting edema bilaterally. Resp:  Normal effort. Rales bilaterally with mild tachypnea. Abd:  No distention. Limited by habitus, but no  tenderness. Other:  Weeping wounds to bilateral LE, with secondary skin changes of venous stasis and chronic lymphedema. Foul smelling, open ulcer to left heel and dorsal aspect of right first toe. S/p bilateral toe amputations.   ED Results / Procedures / Treatments   Labs (all labs ordered are listed, but only abnormal results are displayed) Labs Reviewed  COMPREHENSIVE METABOLIC PANEL - Abnormal; Notable for the following components:      Result Value   Chloride 97 (*)    Glucose, Bld 209 (*)    Calcium 8.2 (*)    Albumin 2.9 (*)    Alkaline Phosphatase 197 (*)    All other components within normal limits  LACTIC ACID, PLASMA - Abnormal; Notable for the following components:   Lactic Acid, Venous 2.3 (*)    All other components within normal limits  CBC WITH DIFFERENTIAL/PLATELET - Abnormal; Notable for the following components:   RBC 4.10 (*)    Hemoglobin 11.7 (*)    MCHC 29.8 (*)    All other components within normal  limits  URINALYSIS, ROUTINE W REFLEX MICROSCOPIC - Abnormal; Notable for the following components:   Color, Urine YELLOW (*)    APPearance CLEAR (*)    Glucose, UA 150 (*)    Ketones, ur 5 (*)    Protein, ur >=300 (*)    All other components within normal limits  CULTURE, BLOOD (ROUTINE X 2)  CULTURE, BLOOD (ROUTINE X 2)  LACTIC ACID, PLASMA  PROTIME-INR  DIGOXIN LEVEL  BRAIN NATRIURETIC PEPTIDE  HIV ANTIBODY (ROUTINE TESTING W REFLEX)  BASIC METABOLIC PANEL  CBC  CREATININE, SERUM  HEMOGLOBIN A1C  TYPE AND SCREEN  TYPE AND SCREEN  TYPE AND SCREEN     EKG  Accelerated junctional tachycardia, ventricular rate 123.  QRS 114, QTc 468.  Right bundle branch block.  No acute ST elevations.   RADIOLOGY Chest x-ray: Reviewed by me, shows mild bilateral pulmonary edema and cardiomegaly Ultrasound DVT: Diffuse edema, no DVT.  Agree with radiology DG Foot Right: Previous amputations noted, ? Arthroopathy at first IP joint DG Foot Left: No acute  abnormality    PROCEDURES:  Critical Care performed: No  .1-3 Lead EKG Interpretation Performed by: Duffy Bruce, MD Authorized by: Duffy Bruce, MD     Interpretation: non-specific     ECG rate:  100-120s   ECG rate assessment: tachycardic     Rhythm: other rhythm     Ectopy: PVCs     Conduction: normal   Comments:     Indication: Weakness .Critical Care Performed by: Duffy Bruce, MD Authorized by: Duffy Bruce, MD   Critical care provider statement:    Critical care time (minutes):  30   Critical care time was exclusive of:  Separately billable procedures and treating other patients   Critical care was necessary to treat or prevent imminent or life-threatening deterioration of the following conditions:  Cardiac failure, circulatory failure, sepsis and respiratory failure   Critical care was time spent personally by me on the following activities:  Development of treatment plan with patient or surrogate, discussions with consultants, evaluation of patient's response to treatment, examination of patient, ordering and review of laboratory studies, ordering and review of radiographic studies, ordering and performing treatments and interventions, pulse oximetry, re-evaluation of patient's condition and review of old [place] ED: Medications  vancomycin (VANCOCIN) IVPB 1000 mg/200 mL premix (has no administration in time range)    Followed by  vancomycin (VANCOREADY) IVPB 1500 mg/300 mL (has no administration in time range)  metoprolol tartrate (LOPRESSOR) tablet 25 mg (has no administration in time range)  nitroGLYCERIN (NITROSTAT) SL tablet 0.4 mg (has no administration in time range)  insulin glargine (LANTUS) Solostar Pen 50 Units (has no administration in time range)  tamsulosin (FLOMAX) capsule 0.4 mg (has no administration in time range)  sodium chloride flush (NS) 0.9 % injection 3 mL (has no administration in time range)  sodium  chloride flush (NS) 0.9 % injection 3 mL (has no administration in time range)  0.9 %  sodium chloride infusion (has no administration in time range)  acetaminophen (TYLENOL) tablet 650 mg (has no administration in time range)  ondansetron (ZOFRAN) injection 4 mg (has no administration in time range)  enoxaparin (LOVENOX) injection 80 mg (has no administration in time range)  furosemide (LASIX) injection 40 mg (has no administration in time range)  insulin aspart (novoLOG) injection 0-5 Units (has no administration in time range)  insulin aspart (novoLOG) injection 0-20 Units (has no administration in time  range)  oxyCODONE (Oxy IR/ROXICODONE) immediate release tablet 10 mg (10 mg Oral Given 05/08/21 1158)  HYDROcodone-acetaminophen (NORCO/VICODIN) 5-325 MG per tablet 1 tablet (1 tablet Oral Given 05/08/21 1814)  ondansetron (ZOFRAN-ODT) disintegrating tablet 4 mg (4 mg Oral Given 05/08/21 1814)  furosemide (LASIX) injection 40 mg (40 mg Intravenous Given 05/08/21 2154)  metoprolol tartrate (LOPRESSOR) injection 5 mg (5 mg Intravenous Given 05/08/21 2154)  ceFEPIme (MAXIPIME) 2 g in sodium chloride 0.9 % 100 mL IVPB (2 g Intravenous New Bag/Given 05/08/21 2319)     IMPRESSION / MDM / ASSESSMENT AND PLAN / ED COURSE  I reviewed the triage vital signs and the nursing notes.                               The patient is on the cardiac monitor to evaluate for evidence of arrhythmia and/or significant heart rate changes.   Ddx: CHF with anasarca, renal failure, liver failure, venous stasis, LE cellulitis with diabetic foot wounds, AFib RVR with subsequent CHF  66 yo M here with multiple complaints, primarily LE edema likely 2/2 overt fluid overload and CHF. Pt has diffuse anasarca on exam, chest x-ray concerning for possible pulmonary edema.  No signs of significant renal failure on CMP.  CBC without significant leukocytosis and anemia is at baseline.  No apparent liver failure based on normal LFTs.  DVT  study of the bilateral extremity shows no evidence of DVT.  Regarding his foot wounds, he does have multiple areas that appear to be superinfected.  Will start empiric antibiotics.  Certainly high risk for osteo and has a history of multiple prior amputations.  Plain films are largely unremarkable.  There is mention of possible regular arthropathy at the first IP joint, and he does have a wound here.  Will likely need podiatry consult as an inpatient.  Will plan to admit to medicine for IV diuresis, management of what I suspect is a secondary exacerbation of his A. fib, as well as superimposed wound cellulitis.  Pt given IV lasix, IV lopressor for his diuresis and afib. Given IV Vanc/Cefepime for his possible diabetic ulcer-related cellulitis.   MEDICATIONS GIVEN IN ED: Medications  vancomycin (VANCOCIN) IVPB 1000 mg/200 mL premix (has no administration in time range)    Followed by  vancomycin (VANCOREADY) IVPB 1500 mg/300 mL (has no administration in time range)  metoprolol tartrate (LOPRESSOR) tablet 25 mg (has no administration in time range)  nitroGLYCERIN (NITROSTAT) SL tablet 0.4 mg (has no administration in time range)  insulin glargine (LANTUS) Solostar Pen 50 Units (has no administration in time range)  tamsulosin (FLOMAX) capsule 0.4 mg (has no administration in time range)  sodium chloride flush (NS) 0.9 % injection 3 mL (has no administration in time range)  sodium chloride flush (NS) 0.9 % injection 3 mL (has no administration in time range)  0.9 %  sodium chloride infusion (has no administration in time range)  acetaminophen (TYLENOL) tablet 650 mg (has no administration in time range)  ondansetron (ZOFRAN) injection 4 mg (has no administration in time range)  enoxaparin (LOVENOX) injection 80 mg (has no administration in time range)  furosemide (LASIX) injection 40 mg (has no administration in time range)  insulin aspart (novoLOG) injection 0-5 Units (has no administration in  time range)  insulin aspart (novoLOG) injection 0-20 Units (has no administration in time range)  oxyCODONE (Oxy IR/ROXICODONE) immediate release tablet 10 mg (10 mg Oral Given  05/08/21 1158)  HYDROcodone-acetaminophen (NORCO/VICODIN) 5-325 MG per tablet 1 tablet (1 tablet Oral Given 05/08/21 1814)  ondansetron (ZOFRAN-ODT) disintegrating tablet 4 mg (4 mg Oral Given 05/08/21 1814)  furosemide (LASIX) injection 40 mg (40 mg Intravenous Given 05/08/21 2154)  metoprolol tartrate (LOPRESSOR) injection 5 mg (5 mg Intravenous Given 05/08/21 2154)  ceFEPIme (MAXIPIME) 2 g in sodium chloride 0.9 % 100 mL IVPB (2 g Intravenous New Bag/Given 05/08/21 2319)      Consults: Hospitalist consult Dr. Damita Dunnings, who will admit.   EMR reviewed Reviewed old records from office visit at [facility] today showing his anasarca, weakness, also Dr. Melynda Ripple endocrinology visit 03/2021.     FINAL CLINICAL IMPRESSION(S) / ED DIAGNOSES   Final diagnoses:  Acute on chronic congestive heart failure, unspecified heart failure type ([place])  Leg edema  Diabetic ulcer of both feet ([place])     Rx / DC Orders   ED Discharge Orders     None        Note:  This document was prepared using Dragon voice recognition software and may include unintentional dictation errors.   Duffy Bruce, MD 05/08/21 [phone]

## 2021-05-08 NOTE — ED Triage Notes (Signed)
Pt here with bilateral leg swelling and tachycardia. Pt has had draining from both lower extremities which has increased with the past few days. Pt also having tachycardia for a few weeks. Pt in NAD in triage.[name]

## 2021-05-08 NOTE — H&P (Signed)
History and Physical    [name]. OHF:[id number] DOB: Jul 27, 1955 DOA: 05/08/2021  PCP: Alec Crouch, MD   Patient coming from: home  I have personally briefly reviewed patient's relevant medical records in [place]  Chief Complaint: lower extremity edema  HPI: [name]. is a 66 y.o. male with medical history significant for CAD s/p CABG 2006 and PCI of RCA 12/2020 and PCI, HTN, BPH, DM, class III obesity, PAD, A. fib on apixaban, amiodarone and digoxin, sleep apnea, chronic ulcer left foot, PAD s/p left first ray amputation, chronic venous stasis on Unna boots, chronic ulcer left foot, diastolic CHF last EF 60 to 65% 12/2020, OSA on CPAP who presents to the emergency room for evaluation of worsening bilateral lower extremity edema associated with weeping of the extremities as well as dyspnea on exertion.  Patient was hospitalized in September 2022 for cellulitis of chronic left foot ulcer and found to have streptococcal bacteremia at that time.  LLE angiogram showed no significant stenosis.  Patient reports that his diuretics are not helping with the swelling.  He denies cough, fever or chills.  He is followed both by vascular surgery and podiatry.  ED course: Afebrile.  Tachycardic to the 120s and tachypneic to 22, BP 134/91.  O2 sat 94% on room air Blood work WBC 8.8 with lactic acid 2.3-1.5, blood glucose 204 with other blood work unremarkable BNP and digoxin level pending Urinalysis unremarkable COVID and flu pending  Patient started on cefepime and vancomycin and given a dose of furosemide as well as a dose of metoprolol.  Hospitalist consulted for admission.   Review of Systems: As per HPI otherwise all other systems on review of systems negative.   Assessment/Plan    Bilateral cellulitis of lower leg , with possible sepsis   Left heel ulcer with fat layer exposed, left ([place])   Infected ulcer left great toe -Rocephin and vancomycin - Keep legs  elevated - Follow blood and wound cultures.  History of streptococcal bacteremia related to cellulitis - Consider vascular consult for repeat angiogram (no significant stenosis in September 2022) - Podiatry consult    Acute on chronic  CHF unspecified (congestive heart failure)  -Patient with previous EF of 35%, now improved to 60% on 9/22 - IV Lasix - Continue metoprolol, digoxin - Daily weights with intake and output monitoring - Consider cardiology consult   Chronic anticoagulation   AF (paroxysmal atrial fibrillation) (HCC) -Continue amiodarone, - We will hold apixaban in case debridement necessary      Essential hypertension, benign - Continue metoprolol    PAD (peripheral artery disease) (HCC) - Continue clopidogrel and atorvastatin - Consider vascular consult    CAD (coronary artery disease), autologous vein bypass graft - Denies chest pain and no acute ST-T wave changes on EKG - Continue clopidogrel, atorvastatin, metoprolol and nitroglycerin as needed    Hypersomnia with sleep apnea - Continue CPAP and supplemental oxygen as needed    Diabetes mellitus without complication (HCC) - Sliding scale insulin    Obesity, Class III, BMI 40-49.9 (morbid obesity) (HCC) - Complicating factor to overall prognosis and care - PT and OT evaluations    BPH (benign prostatic hyperplasia) - Continue tamsulosin     DVT prophylaxis: Lovenox  Code Status: full code  Family Communication:  wife  Disposition Plan: Back to previous home environment Consults called: Podiatry, cardiology, vascular Status:At the time of admission, it appears that the appropriate admission status for this patient  is INPATIENT. This is judged to be reasonable and necessary in order to provide the required intensity of service to ensure the patient's safety given the presenting symptoms, physical exam findings, and initial radiographic and laboratory data in the context of their  Comorbid conditions.    Patient requires inpatient status due to high intensity of service, high risk for further deterioration and high frequency of surveillance required.   I certify that at the point of admission it is my clinical judgment that the patient will require inpatient hospital care spanning beyond 2 midnights     Physical Exam: Vitals:   05/08/21 2159 05/08/21 2200 05/08/21 2245 05/08/21 2300  BP: 111/82 104/78 (!) 134/96 132/90  Pulse: (!) 123 (!) 120 (!) 123 (!) 121  Resp: 20  (!) 21 (!) 21  Temp:      TempSrc:      SpO2: 90% 90% 98% 97%  Weight:      Height:       Constitutional: Alert, oriented x 3 .  Conversational dyspnea HEENT:      Head: Normocephalic and atraumatic.         Eyes: PERLA, EOMI, Conjunctivae are normal. Sclera is non-icteric.       Mouth/Throat: Mucous membranes are moist.       Neck: Supple with no signs of meningismus. Cardiovascular: Tachycardic. No murmurs, gallops, or rubs. 2+ symmetrical distal pulses are present . No JVD.  3+ LE edema Respiratory: Respiratory effort increased.bibasilar rales Gastrointestinal: Soft, non tender, non distended. Positive bowel sounds.  Genitourinary: No CVA tenderness. Musculoskeletal: Nontender with normal range of motion in all extremities. No cyanosis, or erythema of extremities.  Left heel ulcer and great toe ulcer and right great toe ulcer.  Amputation third digit right foot Neurologic:  Face is symmetric. Moving all extremities. No gross focal neurologic deficits . Skin: Skin is warm, dry.  No rash or ulcers Psychiatric: Mood and affect are appropriate     Past Medical History:  Diagnosis Date   Asthma    Coronary artery disease    Depression    Diabetes mellitus without complication ([place])    Gout    History anabolic steroid use    Hyperlipidemia    Hypertension    Hypogonadism in male    Morbid obesity ([place])    Myocardial infarction ([place])    Peripheral vascular disease ([place])    Perirectal abscess    Pleurisy     Sleep apnea    CPAP at night, no oxygen   Varicella     Past Surgical History:  Procedure Laterality Date   ABDOMINAL AORTIC ANEURYSM REPAIR     ACHILLES TENDON SURGERY Left 01/10/2021   Procedure: ACHILLES LENGTHENING/KIDNER;  Surgeon: Caroline More, DPM;  Location: [facility];  Service: Podiatry;  Laterality: Left;   AMPUTATION TOE Right 02/10/2016   Procedure: AMPUTATION TOE 3RD TOE;  Surgeon: Samara Deist, DPM;  Location: [facility];  Service: Podiatry;  Laterality: Right;   AMPUTATION TOE Left 02/24/2020   Procedure: AMPUTATION TOE;  Surgeon: Caroline More, DPM;  Location: [facility];  Service: Podiatry;  Laterality: Left;   APPLICATION OF WOUND VAC Left 02/29/2020   Procedure: APPLICATION OF WOUND VAC;  Surgeon: Caroline More, DPM;  Location: [facility];  Service: Podiatry;  Laterality: Left;   COLONOSCOPY WITH PROPOFOL [name] 11/18/2015   Procedure: COLONOSCOPY WITH PROPOFOL;  Surgeon: Manya Silvas, MD;  Location: [facility];  Service: Endoscopy;  Laterality: [name];   CORONARY ARTERY BYPASS GRAFT  CORONARY STENT INTERVENTION [name] 02/02/2020   Procedure: CORONARY STENT INTERVENTION;  Surgeon: Isaias Cowman, MD;  Location: [place] [facility];  Service: Cardiovascular;  Laterality: [name];   IRRIGATION AND DEBRIDEMENT FOOT Left 02/29/2020   Procedure: IRRIGATION AND DEBRIDEMENT FOOT;  Surgeon: Caroline More, DPM;  Location: [facility];  Service: Podiatry;  Laterality: Left;   IRRIGATION AND DEBRIDEMENT FOOT Left 02/24/2020   Procedure: IRRIGATION AND DEBRIDEMENT FOOT;  Surgeon: Caroline More, DPM;  Location: [facility];  Service: Podiatry;  Laterality: Left;   KNEE ARTHROSCOPY     LEFT HEART CATH AND CORS/GRAFTS ANGIOGRAPHY [name] 02/02/2020   Procedure: LEFT HEART CATH AND CORS/GRAFTS ANGIOGRAPHY;  Surgeon: Teodoro Spray, MD;  Location: [place] [facility];  Service: Cardiovascular;  Laterality: [name];   LOWER EXTREMITY ANGIOGRAPHY Left 02/25/2020   Procedure: Lower Extremity  Angiography;  Surgeon: Algernon Huxley, MD;  Location: [place] [facility];  Service: Cardiovascular;  Laterality: Left;   LOWER EXTREMITY ANGIOGRAPHY Left 01/04/2021   Procedure: LOWER EXTREMITY ANGIOGRAPHY;  Surgeon: Algernon Huxley, MD;  Location: [place] [facility];  Service: Cardiovascular;  Laterality: Left;   METATARSAL HEAD EXCISION Left 01/10/2021   Procedure: METATARSAL HEAD EXCISION - LEFT 5th;  Surgeon: Caroline More, DPM;  Location: [facility];  Service: Podiatry;  Laterality: Left;   PERIPHERAL VASCULAR CATHETERIZATION Right 01/24/2016   Procedure: Lower Extremity Angiography;  Surgeon: Katha Cabal, MD;  Location: [place] [facility];  Service: Cardiovascular;  Laterality: Right;   PERIPHERAL VASCULAR CATHETERIZATION Right 01/25/2016   Procedure: Lower Extremity Angiography;  Surgeon: Katha Cabal, MD;  Location: [place] [facility];  Service: Cardiovascular;  Laterality: Right;   TOE AMPUTATION     TONSILLECTOMY       reports that he quit smoking about 6 years ago. His smoking use included cigarettes. He has a 22.50 pack-year smoking history. He has never used smokeless tobacco. He reports that he does not currently use alcohol after a past usage of about 3.0 standard drinks per week. He reports that he does not use drugs.  Allergies  Allergen Reactions   Statins     Other reaction(s): Muscle Pain Causes legs to ache per pt    History reviewed. No pertinent family history.    Prior to Admission medications   Medication Sig Start Date End Date Taking? Authorizing Provider  amiodarone (PACERONE) 200 MG tablet Take 1 tablet (200 mg total) by mouth daily. 01/16/21   Bonnielee Haff, MD  apixaban (ELIQUIS) 5 MG TABS tablet Take 1 tablet (5 mg total) by mouth 2 (two) times daily. 01/26/16   Stegmayer, Joelene Millin A, PA-C  atorvastatin (LIPITOR) 40 MG tablet Take 1 tablet (40 mg total) by mouth daily. 02/03/20   Teodoro Spray, MD  clopidogrel (PLAVIX) 75 MG tablet Take 1 tablet  (75 mg total) by mouth daily with breakfast. 02/04/20   Teodoro Spray, MD  digoxin 62.5 MCG TABS Take 0.0625 mg by mouth daily. 01/16/21   Bonnielee Haff, MD  diltiazem (CARDIZEM CD) 300 MG 24 hr capsule Take 1 capsule (300 mg total) by mouth daily. 01/16/21   Bonnielee Haff, MD  ezetimibe (ZETIA) 10 MG tablet Take 10 mg by mouth daily. 12/19/20 12/19/21  [provider]  ferrous sulfate 325 (65 FE) MG tablet Take 325 mg by mouth daily with breakfast.    [provider]  furosemide (LASIX) 20 MG tablet Take 20 mg by mouth daily as needed for fluid or edema.  [provider]  gabapentin (NEURONTIN) 300 MG capsule Take 300 mg by mouth daily. 12/02/20   [provider]  insulin aspart (NOVOLOG) 100 UNIT/ML injection Inject 9 Units into the skin 3 (three) times daily with meals. 01/16/21   Bonnielee Haff, MD  insulin lispro (HUMALOG) 100 UNIT/ML injection Inject 15-28 Units into the skin 3 (three) times daily with meals. 12/20/20   [provider]  LANTUS SOLOSTAR 100 UNIT/ML Solostar Pen Inject 50 Units into the skin 2 (two) times daily. 01/16/21   Bonnielee Haff, MD  MEDIUM CHAIN TRIGLYCERIDES PO Take 1,000 mg by mouth daily.    [provider]  metFORMIN (GLUCOPHAGE) 1000 MG tablet Take 1,000 mg by mouth 2 (two) times daily.  12/02/17   [provider]  metoprolol tartrate (LOPRESSOR) 25 MG tablet Take 1 tablet (25 mg total) by mouth 2 (two) times daily. 01/17/21   Bonnielee Haff, MD  Multiple Vitamins-Minerals (MULTIVITAMIN WITH MINERALS) tablet Take 1 tablet by mouth daily.    [provider]  nitroGLYCERIN (NITROSTAT) 0.4 MG SL tablet Place 0.4 mg under the tongue daily as needed. 01/25/20   [provider]  OZEMPIC, 0.25 OR 0.5 MG/DOSE, 2 MG/1.5ML SOPN Inject 0.25 mg into the skin once a week. 01/16/21   Bonnielee Haff, MD  pramipexole (MIRAPEX) 1 MG tablet Take 2 mg by mouth at bedtime. 11/09/20   [provider]   primidone (MYSOLINE) 250 MG tablet Take 250 mg by mouth 2 (two) times daily. 01/23/20   [provider]  tamsulosin (FLOMAX) 0.4 MG CAPS capsule Take 0.4 mg by mouth daily.    [provider]  vitamin B-12 (CYANOCOBALAMIN) 1000 MCG tablet Take 10,000 mcg by mouth daily. Energy shot    [provider]  zinc gluconate 50 MG tablet Take 50 mg by mouth daily.    [provider]      Labs on Admission: I have personally reviewed following labs and imaging studies  CBC: Recent Labs  Lab 05/08/21 1154  WBC 8.8  NEUTROABS 6.6  HGB 11.7*  HCT 39.2  MCV 95.6  PLT 315   Basic Metabolic Panel: Recent Labs  Lab 05/08/21 1154  NA 139  K 5.1  CL 97*  CO2 32  GLUCOSE 209*  BUN 20  CREATININE 1.02  CALCIUM 8.2*   GFR: Estimated Creatinine Clearance: 116.5 mL/min (by C-G formula based on SCr of 1.02 mg/dL). Liver Function Tests: Recent Labs  Lab 05/08/21 1154  AST 27  ALT 31  ALKPHOS 197*  BILITOT 0.8  PROT 7.3  ALBUMIN 2.9*   No results for input(s): LIPASE, AMYLASE in the last 168 hours. No results for input(s): AMMONIA in the last 168 hours. Coagulation Profile: Recent Labs  Lab 05/08/21 1154  INR 1.2   Cardiac Enzymes: No results for input(s): CKTOTAL, CKMB, CKMBINDEX, TROPONINI in the last 168 hours. BNP (last 3 results) No results for input(s): PROBNP in the last 8760 hours. HbA1C: No results for input(s): HGBA1C in the last 72 hours. CBG: No results for input(s): GLUCAP in the last 168 hours. Lipid Profile: No results for input(s): CHOL, HDL, LDLCALC, TRIG, CHOLHDL, LDLDIRECT in the last 72 hours. Thyroid Function Tests: No results for input(s): TSH, T4TOTAL, FREET4, T3FREE, THYROIDAB in the last 72 hours. Anemia Panel: No results for input(s): VITAMINB12, FOLATE, FERRITIN, TIBC, IRON, RETICCTPCT in the last 72 hours. Urine analysis:    Component Value Date/Time   COLORURINE YELLOW (A) 05/08/2021 2202   APPEARANCEUR  CLEAR (A) 05/08/2021 2202   LABSPEC 1.026 05/08/2021 2202   PHURINE 7.0 05/08/2021 2202   GLUCOSEU 150 (A) 05/08/2021 2202   HGBUR NEGATIVE 05/08/2021 2202   BILIRUBINUR NEGATIVE 05/08/2021 2202   KETONESUR 5 (A) 05/08/2021 2202   PROTEINUR >=300 (A) 05/08/2021 2202   NITRITE NEGATIVE 05/08/2021 2202   LEUKOCYTESUR NEGATIVE 05/08/2021 2202    Radiological Exams on Admission: DG Chest 2 View  Result Date: 05/08/2021 CLINICAL DATA:  Sepsis. EXAM: CHEST - 2 VIEW COMPARISON:  January 16, 2021. FINDINGS: Status post coronary bypass graft. Stable cardiomegaly with central pulmonary vascular congestion is noted. Mild bilateral pulmonary edema may be present. No consolidative process is noted. Bony thorax is unremarkable. IMPRESSION: Stable cardiomegaly with mild central pulmonary vascular congestion and probable mild bilateral pulmonary edema. Electronically Signed   By: Marijo Conception M.D.   On: 05/08/2021 12:31   US Venous Img Lower Bilateral  Result Date: 05/08/2021 CLINICAL DATA:  Pain and swelling EXAM: Bilateral LOWER EXTREMITY VENOUS DOPPLER ULTRASOUND TECHNIQUE: Gray-scale sonography with compression, as well as color and duplex ultrasound, were performed to evaluate the deep venous system(s) from the level of the common femoral vein through the popliteal and proximal calf veins. COMPARISON:  None. FINDINGS: VENOUS Normal compressibility of the common femoral, superficial femoral, and popliteal veins, as well as the visualized calf veins. Visualized portions of profunda femoral vein and great saphenous vein unremarkable. No filling defects to suggest DVT on grayscale or color Doppler imaging. Doppler waveforms show normal direction of venous flow, normal respiratory plasticity and response to augmentation. OTHER None. Limitations: Edema and habitus. IMPRESSION: Negative. Electronically Signed   By: Donavan Foil M.D.   On: 05/08/2021 21:19   DG Foot Complete Left  Result Date:  05/08/2021 CLINICAL DATA:  Bilateral foot wound EXAM: LEFT FOOT - COMPLETE 3+ VIEW COMPARISON:  01/10/2021 FINDINGS: No acute fracture is seen. Previous trans meta tarsal amputation third digit and partial amputation of fifth metatarsal. Cut margins appear smooth. No bony destructive change. No soft tissue emphysema. IMPRESSION: No acute osseous abnormality. Previous transmetatarsal amputation third digit and partial amputation of fifth metatarsal. Electronically Signed   By: Donavan Foil M.D.   On: 05/08/2021 23:06   DG Foot Complete Right  Result Date: 05/08/2021 CLINICAL DATA:  Foot wound EXAM: RIGHT FOOT COMPLETE - 3+ VIEW COMPARISON:  None. FINDINGS: No fracture is seen. Prior transmetatarsal amputation second digit and previous amputation of third digit at the level of the MTP joint. Smooth cut margins. Irregular arthropathy with erosive changes at the first IP joint of uncertain chronicity. There is soft tissue swelling. Negative for soft tissue emphysema. IMPRESSION: 1. Previous partial amputation of the second and third digits without acute findings here 2. Irregular arthropathy at the first IP joint with erosive changes of uncertain chronicity, correlate for signs of active infection here. Evaluation with MRI may be obtained as indicated. Electronically Signed   By: Donavan Foil M.D.   On: 05/08/2021 23:09       Athena Masse MD [facility]   05/08/2021, 11:25 PM

## 2021-05-08 NOTE — Progress Notes (Signed)
PHARMACY -  BRIEF ANTIBIOTIC NOTE   Pharmacy has received consult(s) for Cefepime and Vancomycin from an ED provider.  The patient's profile has been reviewed for ht/wt/allergies/indication/available labs.    One time order(s) placed for Cefepime 2 gm and Vancomycin 2500 mg per pt wt of 158.8 kg.  Further antibiotics/pharmacy consults should be ordered by admitting physician if indicated.                       Thank you, Renda Rolls, PharmD, [facility] 05/08/2021 11:04 PM

## 2021-05-09 DIAGNOSIS — R601 Generalized edema: Secondary | ICD-10-CM

## 2021-05-09 LAB — BLOOD CULTURE ID PANEL (REFLEXED) - BCID2

## 2021-05-09 LAB — RESP PANEL BY RT-PCR (FLU A&B, COVID) ARPGX2
Influenza A by PCR: NEGATIVE
Influenza B by PCR: NEGATIVE
SARS Coronavirus 2 by RT PCR: NEGATIVE

## 2021-05-09 LAB — CBG MONITORING, ED
Glucose-Capillary: 170 mg/dL — ABNORMAL HIGH (ref 70–99)
Glucose-Capillary: 192 mg/dL — ABNORMAL HIGH (ref 70–99)
Glucose-Capillary: 269 mg/dL — ABNORMAL HIGH (ref 70–99)
Glucose-Capillary: 340 mg/dL — ABNORMAL HIGH (ref 70–99)

## 2021-05-09 LAB — BASIC METABOLIC PANEL
Anion gap: 8 (ref 5–15)
BUN: 20 mg/dL (ref 8–23)
CO2: 32 mmol/L (ref 22–32)
Calcium: 8.4 mg/dL — ABNORMAL LOW (ref 8.9–10.3)
Chloride: 96 mmol/L — ABNORMAL LOW (ref 98–111)
Creatinine, Ser: 1.15 mg/dL (ref 0.61–1.24)
GFR, Estimated: >60 mL/min (ref >60)
Glucose, Bld: 178 mg/dL — ABNORMAL HIGH (ref 70–99)
Potassium: 4.3 mmol/L (ref 3.5–5.1)
Sodium: 136 mmol/L (ref 135–145)

## 2021-05-09 LAB — HEMOGLOBIN A1C
Hgb A1c MFr Bld: 9.2 % — ABNORMAL HIGH (ref 4.8–5.6)
Mean Plasma Glucose: 217.34 mg/dL

## 2021-05-09 LAB — DIGOXIN LEVEL: Digoxin Level: <0.2 ng/mL — ABNORMAL LOW (ref 0.8–2.0)

## 2021-05-09 LAB — HIV ANTIBODY (ROUTINE TESTING W REFLEX): HIV Screen 4th Generation wRfx: NONREACTIVE

## 2021-05-09 LAB — BRAIN NATRIURETIC PEPTIDE: B Natriuretic Peptide: 531.5 pg/mL — ABNORMAL HIGH (ref 0.0–100.0)

## 2021-05-09 LAB — APTT: aPTT: 34 seconds (ref 24–36)

## 2021-05-09 MED ORDER — AMIODARONE IV BOLUS ONLY 150 MG/100ML
INTRAVENOUS | Status: AC
  Filled 2021-05-09: qty 100

## 2021-05-09 MED ORDER — DIGOXIN 125 MCG PO TABS
0.0625 mg | ORAL_TABLET | Freq: Every day | ORAL | Status: DC
  Administered 2021-05-09 – 2021-05-16 (×8): 0.0625 mg via ORAL
  Filled 2021-05-09 (×8): qty 0.5

## 2021-05-09 MED ORDER — VANCOMYCIN HCL 1500 MG/300ML IV SOLN: 1500.0000 mg | Freq: Two times a day (BID) | INTRAVENOUS | Status: DC

## 2021-05-09 MED ORDER — HYDROCODONE-ACETAMINOPHEN 5-325 MG PO TABS
1.0000 | ORAL_TABLET | ORAL | Status: DC | PRN
  Administered 2021-05-09 – 2021-05-15 (×16): 1 via ORAL
  Filled 2021-05-09 (×16): qty 1

## 2021-05-09 MED ORDER — EZETIMIBE 10 MG PO TABS
10.0000 mg | ORAL_TABLET | Freq: Every day | ORAL | Status: DC
  Administered 2021-05-09 – 2021-05-16 (×8): 10 mg via ORAL
  Filled 2021-05-09 (×8): qty 1

## 2021-05-09 MED ORDER — METRONIDAZOLE 500 MG/100ML IV SOLN
500.0000 mg | Freq: Two times a day (BID) | INTRAVENOUS | Status: DC
  Administered 2021-05-09 – 2021-05-10 (×3): 500 mg via INTRAVENOUS
  Filled 2021-05-09 (×3): qty 100

## 2021-05-09 MED ORDER — PRAMIPEXOLE DIHYDROCHLORIDE 1 MG PO TABS
2.0000 mg | ORAL_TABLET | Freq: Every day | ORAL | Status: DC
  Administered 2021-05-09 – 2021-05-15 (×7): 2 mg via ORAL
  Filled 2021-05-09 (×8): qty 2

## 2021-05-09 MED ORDER — SODIUM CHLORIDE 0.9 % IV SOLN: 2.0000 g | Freq: Three times a day (TID) | INTRAVENOUS | Status: DC

## 2021-05-09 MED ORDER — INSULIN GLARGINE-YFGN 100 UNIT/ML ~~LOC~~ SOLN
50.0000 [IU] | Freq: Two times a day (BID) | SUBCUTANEOUS | Status: DC
  Administered 2021-05-09 – 2021-05-11 (×4): 50 [IU] via SUBCUTANEOUS
  Filled 2021-05-09 (×6): qty 0.5

## 2021-05-09 MED ORDER — PRAMIPEXOLE DIHYDROCHLORIDE 1 MG PO TABS
1.0000 mg | ORAL_TABLET | Freq: Every day | ORAL | Status: DC
  Administered 2021-05-09: 1 mg via ORAL
  Filled 2021-05-09: qty 1

## 2021-05-09 MED ORDER — PRIMIDONE 250 MG PO TABS
250.0000 mg | ORAL_TABLET | Freq: Two times a day (BID) | ORAL | Status: DC
  Administered 2021-05-09 – 2021-05-16 (×15): 250 mg via ORAL
  Filled 2021-05-09 (×16): qty 1

## 2021-05-09 MED ORDER — ASPIRIN EC 81 MG PO TBEC
81.0000 mg | DELAYED_RELEASE_TABLET | Freq: Every day | ORAL | Status: DC
  Administered 2021-05-09 – 2021-05-16 (×8): 81 mg via ORAL
  Filled 2021-05-09 (×8): qty 1

## 2021-05-09 MED ORDER — AMIODARONE HCL 200 MG PO TABS
200.0000 mg | ORAL_TABLET | Freq: Every day | ORAL | Status: DC
  Administered 2021-05-09: 200 mg via ORAL
  Filled 2021-05-09: qty 1

## 2021-05-09 MED ORDER — AMIODARONE HCL IN DEXTROSE 360-4.14 MG/200ML-% IV SOLN
30.0000 mg/h | INTRAVENOUS | Status: DC
  Administered 2021-05-10 – 2021-05-11 (×3): 30 mg/h via INTRAVENOUS
  Filled 2021-05-09 (×3): qty 200

## 2021-05-09 MED ORDER — ATORVASTATIN CALCIUM 20 MG PO TABS
40.0000 mg | ORAL_TABLET | Freq: Every day | ORAL | Status: DC
  Administered 2021-05-10 – 2021-05-16 (×7): 40 mg via ORAL
  Filled 2021-05-09 (×8): qty 2

## 2021-05-09 MED ORDER — AMIODARONE HCL IN DEXTROSE 360-4.14 MG/200ML-% IV SOLN
60.0000 mg/h | INTRAVENOUS | Status: DC
  Administered 2021-05-09: 60 mg/h via INTRAVENOUS
  Filled 2021-05-09: qty 200

## 2021-05-09 MED ORDER — GABAPENTIN 300 MG PO CAPS
300.0000 mg | ORAL_CAPSULE | Freq: Every day | ORAL | Status: DC
  Administered 2021-05-09 – 2021-05-15 (×7): 300 mg via ORAL
  Filled 2021-05-09 (×8): qty 1

## 2021-05-09 MED ORDER — ISOSORBIDE MONONITRATE ER 60 MG PO TB24
60.0000 mg | ORAL_TABLET | Freq: Two times a day (BID) | ORAL | Status: DC
  Administered 2021-05-09 – 2021-05-16 (×15): 60 mg via ORAL
  Filled 2021-05-09 (×15): qty 1

## 2021-05-09 MED ORDER — SODIUM CHLORIDE 0.9 % IV SOLN
2.0000 g | Freq: Three times a day (TID) | INTRAVENOUS | Status: DC
  Administered 2021-05-09 – 2021-05-11 (×7): 2 g via INTRAVENOUS
  Filled 2021-05-09 (×9): qty 2

## 2021-05-09 MED ORDER — APIXABAN 5 MG PO TABS: 5.0000 mg | ORAL_TABLET | Freq: Two times a day (BID) | ORAL | Status: DC

## 2021-05-09 MED ORDER — HEPARIN (PORCINE) 25000 UT/250ML-% IV SOLN
2350.0000 [IU]/h | INTRAVENOUS | Status: DC
  Administered 2021-05-09: 17:00:00 1800 [IU]/h via INTRAVENOUS
  Administered 2021-05-10: 2350 [IU]/h via INTRAVENOUS
  Filled 2021-05-09 (×3): qty 250

## 2021-05-09 MED ORDER — AMIODARONE LOAD VIA INFUSION
150.0000 mg | Freq: Once | INTRAVENOUS | Status: AC
  Administered 2021-05-09: 150 mg via INTRAVENOUS
  Filled 2021-05-09: qty 83.34

## 2021-05-09 MED ORDER — VANCOMYCIN HCL 1500 MG/300ML IV SOLN
1500.0000 mg | Freq: Two times a day (BID) | INTRAVENOUS | Status: DC
  Administered 2021-05-10 – 2021-05-11 (×3): 1500 mg via INTRAVENOUS
  Filled 2021-05-09 (×6): qty 300

## 2021-05-09 MED ORDER — FUROSEMIDE 10 MG/ML IJ SOLN
60.0000 mg | Freq: Two times a day (BID) | INTRAMUSCULAR | Status: AC
  Administered 2021-05-09 – 2021-05-10 (×3): 60 mg via INTRAVENOUS
  Filled 2021-05-09 (×2): qty 8
  Filled 2021-05-09: qty 6

## 2021-05-09 MED ORDER — METOPROLOL TARTRATE 50 MG PO TABS
50.0000 mg | ORAL_TABLET | Freq: Two times a day (BID) | ORAL | Status: DC
  Administered 2021-05-09: 50 mg via ORAL

## 2021-05-09 MED ORDER — HEPARIN BOLUS VIA INFUSION
5000.0000 [IU] | Freq: Once | INTRAVENOUS | Status: AC
  Administered 2021-05-09: 5000 [IU] via INTRAVENOUS
  Filled 2021-05-09: qty 5000

## 2021-05-09 MED ORDER — DILTIAZEM HCL ER COATED BEADS 180 MG PO CP24
300.0000 mg | ORAL_CAPSULE | Freq: Every day | ORAL | Status: DC
  Administered 2021-05-09 – 2021-05-16 (×8): 300 mg via ORAL
  Filled 2021-05-09 (×8): qty 1

## 2021-05-09 NOTE — Consult Note (Signed)
WOC consulted for LE ulcerations, no images in the chart. Requested images to be placed.  Vascular and podiatry following this patient outpatient. Vascular has seen this patient and is not recommending any further intervention and has deferred care of the LEs to the podiatrist.   In the presence of known PAD I would not feel comfortable placing Unna's boots especially with this patient history of CHF as well has LE pain  I will request images and I will follow along as needed however appears vascular has deferred to podiatry for the care of the wounds.  I will await consultation by podiatrist   [place], [place], [place]

## 2021-05-09 NOTE — Progress Notes (Signed)
PHARMACY - PHYSICIAN COMMUNICATION CRITICAL VALUE ALERT - BLOOD CULTURE IDENTIFICATION (BCID)  [name]. is an 66 y.o. male who presented to [facility] on 05/08/2021 with a chief complaint of cellulitis  Assessment:  Blood cultures with GPC in 1 of 4 bottles, BCID = Staphylococcus species (Not S aureus or S epidermidis).  On antibiotics for cellulitis,  H has PMH of diabetic foot infections  Name of physician (or Provider) Contacted: Dr Si Raider  Current antibiotics: Vancomycin and cefepime  Changes to prescribed antibiotics recommended:  Patient is on recommended antibiotics - No changes needed - blood cx may be contaminant but on vancomycin for skin/soft tissue infection  Results for orders placed or performed during the hospital encounter of 05/08/21  Blood Culture ID Panel (Reflexed) (Collected: 05/08/2021 12:56 PM)  Result Value Ref Range   Enterococcus faecalis NOT DETECTED NOT DETECTED   Enterococcus Faecium NOT DETECTED NOT DETECTED   Listeria monocytogenes NOT DETECTED NOT DETECTED   Staphylococcus species DETECTED (A) NOT DETECTED   Staphylococcus aureus (BCID) NOT DETECTED NOT DETECTED   Staphylococcus epidermidis NOT DETECTED NOT DETECTED   Staphylococcus lugdunensis NOT DETECTED NOT DETECTED   Streptococcus species NOT DETECTED NOT DETECTED   Streptococcus agalactiae NOT DETECTED NOT DETECTED   Streptococcus pneumoniae NOT DETECTED NOT DETECTED   Streptococcus pyogenes NOT DETECTED NOT DETECTED   A.calcoaceticus-baumannii NOT DETECTED NOT DETECTED   Bacteroides fragilis NOT DETECTED NOT DETECTED   Enterobacterales NOT DETECTED NOT DETECTED   Enterobacter cloacae complex NOT DETECTED NOT DETECTED   Escherichia coli NOT DETECTED NOT DETECTED   Klebsiella aerogenes NOT DETECTED NOT DETECTED   Klebsiella oxytoca NOT DETECTED NOT DETECTED   Klebsiella pneumoniae NOT DETECTED NOT DETECTED   Proteus species NOT DETECTED NOT DETECTED   Salmonella species NOT DETECTED  NOT DETECTED   Serratia marcescens NOT DETECTED NOT DETECTED   Haemophilus influenzae NOT DETECTED NOT DETECTED   Neisseria meningitidis NOT DETECTED NOT DETECTED   Pseudomonas aeruginosa NOT DETECTED NOT DETECTED   Stenotrophomonas maltophilia NOT DETECTED NOT DETECTED   Candida albicans NOT DETECTED NOT DETECTED   Candida auris NOT DETECTED NOT DETECTED   Candida glabrata NOT DETECTED NOT DETECTED   Candida krusei NOT DETECTED NOT DETECTED   Candida parapsilosis NOT DETECTED NOT DETECTED   Candida tropicalis NOT DETECTED NOT DETECTED   Cryptococcus neoformans/gattii NOT DETECTED NOT DETECTED    Doreene Eland, PharmD, BCPS, BCIDP Work Cell: [phone] 05/09/2021 10:03 AM

## 2021-05-09 NOTE — Progress Notes (Signed)
Pharmacy Antibiotic Note  [name]. is a 66 y.o. male admitted on 05/08/2021 with cellulitis.  Pharmacy has been consulted for Cefepime and Vancomycin dosing for 7 days.  Plan: Cefepime 2 gm q8h x 7 days per indication and current renal fxn.  Vancomycin Pt given initial dose of 2500 mg x 1 dose. Vancomycin 1500 mg IV Q 12 hrs x 13 doses Goal AUC 400-550. Expected AUC: 490.7 SCr used: 1.02, Vd used: 0.5, BMI 42.6  Pharmacy will continue to follow and adjust abx dosing if warranted.   Height: 6\' 4"  (193 cm) Weight: (!) 158.8 kg (350 lb) IBW/kg (Calculated) : 86.8  Temp (24hrs), Avg:98.2 F (36.8 C), Min:97.9 F (36.6 C), Max:98.5 F (36.9 C)  Recent Labs  Lab 05/08/21 1154 05/08/21 1258  WBC 8.8  --   CREATININE 1.02  --   LATICACIDVEN 2.3* 1.5    Estimated Creatinine Clearance: 116.5 mL/min (by C-G formula based on SCr of 1.02 mg/dL).    Allergies  Allergen Reactions   Statins     Other reaction(s): Muscle Pain Causes legs to ache per pt    Antimicrobials this admission: 1/10 Cefepime >> x 7 days 1/10 Vancomycin  >> 7 days  Microbiology results: 1/9 BCx: Pending  Thank you for allowing pharmacy to be a part of this patients care.  Renda Rolls, PharmD, MBA 05/09/2021 1:12 AM

## 2021-05-09 NOTE — Progress Notes (Addendum)
PROGRESS NOTE    [name].  OIZ:[id number] DOB: 1956/03/04 DOA: 05/08/2021 PCP: [name], MD  Outpatient Specialists: podiatry, vascular surgery, cardiology, endocrinology, wound care    Brief Narrative:   From admission h and p  [name]. is a 66 y.o. male with medical history significant for CAD s/p CABG 2006 and PCI of RCA 12/2020 and PCI, HTN, BPH, DM, class III obesity, PAD, A. fib on apixaban, amiodarone and digoxin, sleep apnea, chronic ulcer left foot, PAD s/p left first ray amputation, chronic venous stasis on [place] boots, chronic ulcer left foot, diastolic CHF last EF 60 to 65% 12/2020, OSA on CPAP who presents to the emergency room for evaluation of worsening bilateral lower extremity edema associated with weeping of the extremities as well as dyspnea on exertion.  Patient was hospitalized in September 2022 for cellulitis of chronic left foot ulcer and found to have streptococcal bacteremia at that time.  LLE angiogram showed no significant stenosis.  Patient reports that his diuretics are not helping with the swelling.  He denies cough, fever or chills.  He is followed both by vascular surgery and podiatry.   Assessment & Plan:   Principal Problem:   Anasarca Active Problems:   Essential hypertension, benign   PAD (peripheral artery disease) (HCC)   Bilateral cellulitis of lower leg   CAD (coronary artery disease), autologous vein bypass graft   Hypersomnia with sleep apnea   Diabetes mellitus without complication (HCC)   Obesity, Class III, BMI 40-49.9 (morbid obesity) (HCC)   BPH (benign prostatic hyperplasia)   Chronic anticoagulation   Acute on chronic diastolic CHF (congestive heart failure) (HCC)   Foot ulcer with fat layer exposed, left (HCC)   Venous stasis of both lower extremities   AF (paroxysmal atrial fibrillation) (HCC)   CHF exacerbation ([place])  # Bilateral lower extremity cellulitis X-ray no overt signs osteo. Tachycardic,  afebrile and feeling well, hemodynamically stable - podiatry to see, further imaging per them - hold eliquis in anticipation of possible surgery - cont vanc/cefepime (started 1/9), add metronidazole (started 1/10)  # Staph bacteremia On 1/4 BCs, coag neg staph, discussed w/ pharm id, likely contaminant - vanc as above - f/u culture and sensitivity and other BCs  # Peripheral arterial disease Angiogram 12/2020 w/o sig stenosis - resume apixaban when able, continue asa and statin  # HFpEF with exacerbation # Acute hypoxic respiratory failure Recent TTE w/ preserved EF. CXR w/ pulmonary vascular congestion/edema, requiring 2 L O2 (86% off), with LE edema - hold home lasix 40 po qd, continue 40 IV bid - strict I/os - cont home dig, amio, dilt,   # A fib with RVR HR in 120s here, hemodynamically stable - treating infection and chf as above - resume home meds dig, amio, dilt, imdur - cardiology consult if rvr persists  # OSA - cpap qhs  # T2DM Hx poor control. Glucose here acceptable - semglee 50, ssi  # HTN Here normotensive - home dilt, metop, imdur  # CAD S/p cabg, recent pci with DES to RCA 12/2020. Asymtpomatic - home aspirin, atorva, zetia - eliquis on hold  # Peripheral neuropathy - home gabapentin  # RLS, tremor - pramipexole, primidone  # BPH - flomax    DVT prophylaxis: lovenox Code Status: full Family Communication: wife updated @ bedside 1/10 Level of care: Progressive Status is: Inpatient  Remains inpatient appropriate because: severity of illness        Consultants:  podiatry  Procedures: none     Subjective: No chest pain or palpitationis, no fevers/chills, has appetite  Objective: Vitals:   05/09/21 0500 05/09/21 0600 05/09/21 0630 05/09/21 0700  BP: 129/83 105/74 128/81 131/79  Pulse: (!) 121 (!) 120 (!) 120 (!) 118  Resp: 20 16 (!) 26 16  Temp:      TempSrc:      SpO2: 97% 93% 98% 98%  Weight:      Height:         Intake/Output Summary (Last 24 hours) at 05/09/2021 0818 Last data filed at 05/09/2021 0655 Gross per 24 hour  Intake 700 ml  Output --  Net 700 ml   Filed Weights   05/08/21 1140  Weight: (!) 158.8 kg    Examination:  General exam: Appears calm and comfortable  Respiratory system: Clear to auscultation. Respiratory effort normal. Cardiovascular system: S1 & S2 heard, tachycardic, mild systolic murmur Gastrointestinal system: Abdomen is obese, soft and nontender. No organomegaly or masses felt. Normal bowel sounds heard. Central nervous system: Alert and oriented. No focal neurological deficits. Extremities: Symmetric 5 x 5 power. Pitting edema to thighs. Erythema to upper calves. Ulcers and flaking of skin. Left heel ulcer. No obvious areas of fluctuance Psychiatry: Judgement and insight appear normal. Mood & affect appropriate.     Data Reviewed: I have personally reviewed following labs and imaging studies  CBC: Recent Labs  Lab 05/08/21 1154  WBC 8.8  NEUTROABS 6.6  HGB 11.7*  HCT 39.2  MCV 95.6  PLT 169   Basic Metabolic Panel: Recent Labs  Lab 05/08/21 1154 05/09/21 0720  NA 139 136  K 5.1 4.3  CL 97* 96*  CO2 32 32  GLUCOSE 209* 178*  BUN 20 20  CREATININE 1.02 1.15  CALCIUM 8.2* 8.4*   GFR: Estimated Creatinine Clearance: 103.3 mL/min (by C-G formula based on SCr of 1.15 mg/dL). Liver Function Tests: Recent Labs  Lab 05/08/21 1154  AST 27  ALT 31  ALKPHOS 197*  BILITOT 0.8  PROT 7.3  ALBUMIN 2.9*   No results for input(s): LIPASE, AMYLASE in the last 168 hours. No results for input(s): AMMONIA in the last 168 hours. Coagulation Profile: Recent Labs  Lab 05/08/21 1154  INR 1.2   Cardiac Enzymes: No results for input(s): CKTOTAL, CKMB, CKMBINDEX, TROPONINI in the last 168 hours. BNP (last 3 results) No results for input(s): PROBNP in the last 8760 hours. HbA1C: No results for input(s): HGBA1C in the last 72 hours. CBG: Recent  Labs  Lab 05/09/21 0758  GLUCAP 170*   Lipid Profile: No results for input(s): CHOL, HDL, LDLCALC, TRIG, CHOLHDL, LDLDIRECT in the last 72 hours. Thyroid Function Tests: No results for input(s): TSH, T4TOTAL, FREET4, T3FREE, THYROIDAB in the last 72 hours. Anemia Panel: No results for input(s): VITAMINB12, FOLATE, FERRITIN, TIBC, IRON, RETICCTPCT in the last 72 hours. Urine analysis:    Component Value Date/Time   COLORURINE YELLOW (A) 05/08/2021 2202   APPEARANCEUR CLEAR (A) 05/08/2021 2202   LABSPEC 1.026 05/08/2021 2202   PHURINE 7.0 05/08/2021 2202   GLUCOSEU 150 (A) 05/08/2021 2202   HGBUR NEGATIVE 05/08/2021 2202   BILIRUBINUR NEGATIVE 05/08/2021 2202   KETONESUR 5 (A) 05/08/2021 2202   PROTEINUR >=300 (A) 05/08/2021 2202   NITRITE NEGATIVE 05/08/2021 2202   LEUKOCYTESUR NEGATIVE 05/08/2021 2202   Sepsis Labs: @LABRCNTIP (procalcitonin:4,lacticidven:4)  ) Recent Results (from the past 240 hour(s))  Culture, blood (Routine x 2)     Status: None (Preliminary  result)   Collection Time: 05/08/21 11:54 AM   Specimen: BLOOD  Result Value Ref Range Status   Specimen Description BLOOD LEFT ANTECUBITAL  Final   Special Requests   Final    BOTTLES DRAWN AEROBIC AND ANAEROBIC Blood Culture adequate volume   Culture   Final    NO GROWTH < 24 HOURS Performed at [facility], [address] [place], [place] [address]    Report Status PENDING  Incomplete  Culture, blood (Routine x 2)     Status: None (Preliminary result)   Collection Time: 05/08/21 12:56 PM   Specimen: BLOOD  Result Value Ref Range Status   Specimen Description BLOOD BLOOD LEFT FOREARM  Final   Special Requests   Final    BOTTLES DRAWN AEROBIC AND ANAEROBIC Blood Culture adequate volume   Culture  Setup Time   Final    GRAM POSITIVE COCCI Organism ID to follow AEROBIC BOTTLE ONLY CRITICAL RESULT CALLED TO, READ BACK BY AND VERIFIED WITH: LISA KOUTZZ 05/09/21 0706 Performed at [facility], [address] [place], [place] [address]    Culture GRAM POSITIVE COCCI  Final   Report Status PENDING  Incomplete  Blood Culture ID Panel (Reflexed)     Status: Abnormal   Collection Time: 05/08/21 12:56 PM  Result Value Ref Range Status   Enterococcus faecalis NOT DETECTED NOT DETECTED Final   Enterococcus Faecium NOT DETECTED NOT DETECTED Final   Listeria monocytogenes NOT DETECTED NOT DETECTED Final   Staphylococcus species DETECTED (A) NOT DETECTED Final    Comment: CRITICAL RESULT CALLED TO, READ BACK BY AND VERIFIED WITH: LISA KOTZZ 05/09/21 0707 MW    Staphylococcus aureus (BCID) NOT DETECTED NOT DETECTED Final   Staphylococcus epidermidis NOT DETECTED NOT DETECTED Final   Staphylococcus lugdunensis NOT DETECTED NOT DETECTED Final   Streptococcus species NOT DETECTED NOT DETECTED Final   Streptococcus agalactiae NOT DETECTED NOT DETECTED Final   Streptococcus pneumoniae NOT DETECTED NOT DETECTED Final   Streptococcus pyogenes NOT DETECTED NOT DETECTED Final   A.calcoaceticus-baumannii NOT DETECTED NOT DETECTED Final   Bacteroides fragilis NOT DETECTED NOT DETECTED Final   Enterobacterales NOT DETECTED NOT DETECTED Final   Enterobacter cloacae complex NOT DETECTED NOT DETECTED Final   Escherichia coli NOT DETECTED NOT DETECTED Final   Klebsiella aerogenes NOT DETECTED NOT DETECTED Final   Klebsiella oxytoca NOT DETECTED NOT DETECTED Final   Klebsiella pneumoniae NOT DETECTED NOT DETECTED Final   Proteus species NOT DETECTED NOT DETECTED Final   Salmonella species NOT DETECTED NOT DETECTED Final   Serratia marcescens NOT DETECTED NOT DETECTED Final   Haemophilus influenzae NOT DETECTED NOT DETECTED Final   Neisseria meningitidis NOT DETECTED NOT DETECTED Final   Pseudomonas aeruginosa NOT DETECTED NOT DETECTED Final   Stenotrophomonas maltophilia NOT DETECTED NOT DETECTED Final   Candida albicans NOT DETECTED NOT DETECTED Final   Candida auris NOT DETECTED NOT  DETECTED Final   Candida glabrata NOT DETECTED NOT DETECTED Final   Candida krusei NOT DETECTED NOT DETECTED Final   Candida parapsilosis NOT DETECTED NOT DETECTED Final   Candida tropicalis NOT DETECTED NOT DETECTED Final   Cryptococcus neoformans/gattii NOT DETECTED NOT DETECTED Final    Comment: Performed at [facility], [address] [place], [place] [address]         Radiology Studies: DG Chest 2 View  Result Date: 05/08/2021 CLINICAL DATA:  Sepsis. EXAM: CHEST - 2 VIEW COMPARISON:  January 16, 2021. FINDINGS: Status post coronary  bypass graft. Stable cardiomegaly with central pulmonary vascular congestion is noted. Mild bilateral pulmonary edema may be present. No consolidative process is noted. Bony thorax is unremarkable. IMPRESSION: Stable cardiomegaly with mild central pulmonary vascular congestion and probable mild bilateral pulmonary edema. Electronically Signed   By: Marijo Conception M.D.   On: 05/08/2021 12:31   US Venous Img Lower Bilateral  Result Date: 05/08/2021 CLINICAL DATA:  Pain and swelling EXAM: Bilateral LOWER EXTREMITY VENOUS DOPPLER ULTRASOUND TECHNIQUE: Gray-scale sonography with compression, as well as color and duplex ultrasound, were performed to evaluate the deep venous system(s) from the level of the common femoral vein through the popliteal and proximal calf veins. COMPARISON:  None. FINDINGS: VENOUS Normal compressibility of the common femoral, superficial femoral, and popliteal veins, as well as the visualized calf veins. Visualized portions of profunda femoral vein and great saphenous vein unremarkable. No filling defects to suggest DVT on grayscale or color Doppler imaging. Doppler waveforms show normal direction of venous flow, normal respiratory plasticity and response to augmentation. OTHER None. Limitations: Edema and habitus. IMPRESSION: Negative. Electronically Signed   By: Donavan Foil M.D.   On: 05/08/2021 21:19   DG Foot Complete  Left  Result Date: 05/08/2021 CLINICAL DATA:  Bilateral foot wound EXAM: LEFT FOOT - COMPLETE 3+ VIEW COMPARISON:  01/10/2021 FINDINGS: No acute fracture is seen. Previous trans meta tarsal amputation third digit and partial amputation of fifth metatarsal. Cut margins appear smooth. No bony destructive change. No soft tissue emphysema. IMPRESSION: No acute osseous abnormality. Previous transmetatarsal amputation third digit and partial amputation of fifth metatarsal. Electronically Signed   By: Donavan Foil M.D.   On: 05/08/2021 23:06   DG Foot Complete Right  Result Date: 05/08/2021 CLINICAL DATA:  Foot wound EXAM: RIGHT FOOT COMPLETE - 3+ VIEW COMPARISON:  None. FINDINGS: No fracture is seen. Prior transmetatarsal amputation second digit and previous amputation of third digit at the level of the MTP joint. Smooth cut margins. Irregular arthropathy with erosive changes at the first IP joint of uncertain chronicity. There is soft tissue swelling. Negative for soft tissue emphysema. IMPRESSION: 1. Previous partial amputation of the second and third digits without acute findings here 2. Irregular arthropathy at the first IP joint with erosive changes of uncertain chronicity, correlate for signs of active infection here. Evaluation with MRI may be obtained as indicated. Electronically Signed   By: Donavan Foil M.D.   On: 05/08/2021 23:09        Scheduled Meds:  enoxaparin (LOVENOX) injection  0.5 mg/kg Subcutaneous Q24H   furosemide  40 mg Intravenous Q12H   gabapentin  300 mg Oral Daily   insulin aspart  0-20 Units Subcutaneous TID WC   insulin aspart  0-5 Units Subcutaneous QHS   insulin glargine-yfgn  50 Units Subcutaneous BID   metoprolol tartrate  25 mg Oral BID   pramipexole  1 mg Oral QHS   sodium chloride flush  3 mL Intravenous Q12H   tamsulosin  0.4 mg Oral Daily   Continuous Infusions:  sodium chloride     ceFEPime (MAXIPIME) IV Stopped (05/09/21 0655)   vancomycin       LOS: 1  day    Time spent: 50 min    Desma Maxim, MD [facility]   If 7PM-7AM, please contact night-coverage [email or web address] Password [facility] 05/09/2021, 8:18 AM

## 2021-05-09 NOTE — Consult Note (Signed)
ANTICOAGULATION CONSULT NOTE - Follow Up Consult  Pharmacy Consult for heparin Indication: atrial fibrillation  Allergies  Allergen Reactions   Statins     Other reaction(s): Muscle Pain Causes legs to ache per pt    Patient Measurements: Height: 6\' 4"  (193 cm) Weight: (!) 158.8 kg (350 lb) IBW/kg (Calculated) : 86.8 Heparin Dosing Weight: 123.6 kg  Vital Signs: Temp: 98.7 F (37.1 C) (01/10 1446) Temp Source: Oral (01/10 1446) BP: 127/101 (01/10 1446) Pulse Rate: 115 (01/10 1446)  Labs: Recent Labs    05/08/21 1154 05/09/21 0720  HGB 11.7*  --   HCT 39.2  --   PLT 289  --   LABPROT 14.8  --   INR 1.2  --   CREATININE 1.02 1.15    Estimated Creatinine Clearance: 103.3 mL/min (by C-G formula based on SCr of 1.15 mg/dL).   Medications:  PTA: Eliquis 5 mg BID, last dose 05/08/21 am Inpatient: Pt received lovenox 80 mg on 1/10 at 0807 for DVT prophylaxis  Assessment: 66 y.o. male with medical history significant for CAD, HTN, BPH, DM, PAD, chronic ulcer left foot, A. fib on apixaban who presents to the ED for evaluation for worsening bilateral lower extremity edema. Pharmacy has been consulted for heparin dosing for afib.  Baseline labs: Hgb 11.7, plts 289, INR 1.2, aPTT ordered   Goal of Therapy:  Heparin level 0.3-0.7 units/ml aPTT 66-102 seconds Monitor platelets by anticoagulation protocol: Yes   Plan:  Give 5000 units bolus x 1 Start heparin infusion at 1800 units/hr Check aPTT level in 6 hours and daily while on heparin, continue to monitor aPTT levels until aPTT and heparin levels correlate  Continue to monitor H&H and platelets  Darnelle Bos, PharmD 05/09/2021,4:05 PM

## 2021-05-09 NOTE — Consult Note (Addendum)
CARDIOLOGY CONSULT NOTE               Patient ID: [name]. MRN: [id number] DOB/AGE: 09-03-1955 66 y.o.  Admit date: 05/08/2021 Referring Physician Dr Damita Dunnings Primary Physician Dr. Doy Hutching Primary Cardiologist Dr. Bartholome Bill  Reason for Consultation heart failure  HPI: pt is a [phone] with a PMH significant for CAD s/p CABG 2006 and PCI of RCA 01/2020, HFpEF (LVEF 60 to 65% 12/2020),HTN, BPH, DM, class III obesity, PAD s/p left third toe amputation, chronic venous stasis on Unna boots,  A. fib on apixaban, amiodarone and digoxin, sleep apnea, chronic ulcer left foot, , OSA on CPAP who presents to the emergency room for evaluation of worsening bilateral lower extremity edema associated with weeping of the extremities.  He was hospitalized 12/2020 with a similar presentation of lower extremity cellulitis and had strep bacteremia at that time. Cardiology was consulted for assistance with diuresis and his heart failure.  The patient is accompanied by his wife who is the main historian as the patient just received pain meds and is sleeping for apparently the first time all night.  She states that the patient has had significant progressive bilateral lower extremity swelling and associated weeping from his chronic lower leg wounds for the past 3-4 weeks.  His wife states that the patient could stand still upright for a few seconds and when he would move up puddle of clear fluid would be left to his feet from where he stood.  She states that the patient drinks significant amounts of caffeinated coffee throughout the day, water and soda -she thinks sometimes it is greater than 64 ounces per day but doesn't really keep track.  She denies the patient having significant dietary indiscretions over the holidays and is reportedly trying to get back to a keto diet.  She encourages the patient to elevate his lower legs as instructed by his primary care provider which he has been compliant with mostly.   He reports compliance with his Lasix which he takes once daily.  His wife thinks he was febrile prior to presentation and overall just felt very badly.  She does not think he has been significantly short of breath however he is not very mobile.   Labs on arrival are significant for creatinine 1.15, EGFR greater than 60.  BNP 531.5, lactate trending 2.3-1.5.  H&H 11.7/39.2. hemoglobin A1c 9.2.  Vitals are significant for blood pressure between 01-410 systolic, heart rate sustained in the 120s since admission.  O2 sat 92% on 2 L by nasal cannula.  He received Lasix 54m x2 while he has been in the ED.  Review of systems complete and found to be negative unless listed above   Past Medical History:  Diagnosis Date   Asthma    Coronary artery disease    Depression    Diabetes mellitus without complication ([facility]    Gout    History anabolic steroid use    Hyperlipidemia    Hypertension    Hypogonadism in male    Morbid obesity ([facility]    Myocardial infarction ([facility]    Peripheral vascular disease ([facility]    Perirectal abscess    Pleurisy    Sleep apnea    CPAP at night, no oxygen   Varicella     Past Surgical History:  Procedure Laterality Date   ABDOMINAL AORTIC ANEURYSM REPAIR     ACHILLES TENDON SURGERY Left 01/10/2021   Procedure: ACHILLES LENGTHENING/KIDNER;  Surgeon: BCaroline More DPM;  Location: [facility];  Service: Podiatry;  Laterality: Left;   AMPUTATION TOE Right 02/10/2016   Procedure: AMPUTATION TOE 3RD TOE;  Surgeon: Samara Deist, DPM;  Location: [facility];  Service: Podiatry;  Laterality: Right;   AMPUTATION TOE Left 02/24/2020   Procedure: AMPUTATION TOE;  Surgeon: Caroline More, DPM;  Location: [facility];  Service: Podiatry;  Laterality: Left;   APPLICATION OF WOUND VAC Left 02/29/2020   Procedure: APPLICATION OF WOUND VAC;  Surgeon: Caroline More, DPM;  Location: [facility];  Service: Podiatry;  Laterality: Left;   COLONOSCOPY WITH PROPOFOL [name] 11/18/2015   Procedure:  COLONOSCOPY WITH PROPOFOL;  Surgeon: Manya Silvas, MD;  Location: [facility];  Service: Endoscopy;  Laterality: [name];   CORONARY ARTERY BYPASS GRAFT     CORONARY STENT INTERVENTION [name] 02/02/2020   Procedure: CORONARY STENT INTERVENTION;  Surgeon: Isaias Cowman, MD;  Location: [place] [facility];  Service: Cardiovascular;  Laterality: [name];   IRRIGATION AND DEBRIDEMENT FOOT Left 02/29/2020   Procedure: IRRIGATION AND DEBRIDEMENT FOOT;  Surgeon: Caroline More, DPM;  Location: [facility];  Service: Podiatry;  Laterality: Left;   IRRIGATION AND DEBRIDEMENT FOOT Left 02/24/2020   Procedure: IRRIGATION AND DEBRIDEMENT FOOT;  Surgeon: Caroline More, DPM;  Location: [facility];  Service: Podiatry;  Laterality: Left;   KNEE ARTHROSCOPY     LEFT HEART CATH AND CORS/GRAFTS ANGIOGRAPHY [name] 02/02/2020   Procedure: LEFT HEART CATH AND CORS/GRAFTS ANGIOGRAPHY;  Surgeon: Teodoro Spray, MD;  Location: [name] [facility];  Service: Cardiovascular;  Laterality: [name];   LOWER EXTREMITY ANGIOGRAPHY Left 02/25/2020   Procedure: Lower Extremity Angiography;  Surgeon: Algernon Huxley, MD;  Location: [place] [facility];  Service: Cardiovascular;  Laterality: Left;   LOWER EXTREMITY ANGIOGRAPHY Left 01/04/2021   Procedure: LOWER EXTREMITY ANGIOGRAPHY;  Surgeon: Algernon Huxley, MD;  Location: [place] [facility];  Service: Cardiovascular;  Laterality: Left;   METATARSAL HEAD EXCISION Left 01/10/2021   Procedure: METATARSAL HEAD EXCISION - LEFT 5th;  Surgeon: Caroline More, DPM;  Location: [facility];  Service: Podiatry;  Laterality: Left;   PERIPHERAL VASCULAR CATHETERIZATION Right 01/24/2016   Procedure: Lower Extremity Angiography;  Surgeon: Katha Cabal, MD;  Location: [place] [facility];  Service: Cardiovascular;  Laterality: Right;   PERIPHERAL VASCULAR CATHETERIZATION Right 01/25/2016   Procedure: Lower Extremity Angiography;  Surgeon: Katha Cabal, MD;  Location: [place] [facility];  Service:  Cardiovascular;  Laterality: Right;   TOE AMPUTATION     TONSILLECTOMY      (Not in a hospital admission)  Social History   Socioeconomic History   Marital status: Married    Spouse name: Not on file   Number of children: Not on file   Years of education: Not on file   Highest education level: Not on file  Occupational History   Not on file  Tobacco Use   Smoking status: Former    Packs/day: 0.50    Years: 45.00    Pack years: 22.50    Types: Cigarettes    Quit date: 04/05/2015    Years since quitting: 6.0   Smokeless tobacco: Never  Vaping Use   Vaping Use: Every day  Substance and Sexual Activity   Alcohol use: Not Currently    Alcohol/week: 3.0 standard drinks    Types: 3 Glasses of wine per week   Drug use: No   Sexual activity: Not on file  Other Topics Concern   Not on file  Social History Narrative  Not on file   Social Determinants of Health   Financial Resource Strain: Not on file  Food Insecurity: Not on file  Transportation Needs: Not on file  Physical Activity: Not on file  Stress: Not on file  Social Connections: Not on file  Intimate Partner Violence: Not on file    History reviewed. No pertinent family history.    Review of systems complete and found to be negative unless listed above   PHYSICAL EXAM General: Obese Caucasian male, well nourished, in no acute distress.  Comfortably sleeping and snoring during interview.  Wife present at bedside HEENT:  Normocephalic and atraumatic. Neck:  No JVD.  Lungs: Normal respiratory effort on 2 L by nasal cannula. Clear bilaterally to auscultation. No wheezes, crackles, rhonchi.  Heart: Tachycardic RR . Normal S1 and S2 without gallops or murmurs. Radial pulse 2+ bilaterally. Abdomen: Obese appearing.  Msk: Normal strength and tone for age. Extremities: Significant bilateral lower extremity edema and tightness with associated chronic venous stasis changes, 2 to 3 cm linear wound on right plantar foot.  Medial right calf with linear open wound draining blood and clear fluid. Top of right great doe with open  ulcer. LEFT foot s/p third toe amputation. no clubbing, cyanosis.   Neuro: Somnolent.  Psych:  unable to assess  Labs:   Lab Results  Component Value Date   WBC 8.8 05/08/2021   HGB 11.7 (L) 05/08/2021   HCT 39.2 05/08/2021   MCV 95.6 05/08/2021   PLT 289 05/08/2021    Recent Labs  Lab 05/08/21 1154 05/09/21 0720  NA 139 136  K 5.1 4.3  CL 97* 96*  CO2 32 32  BUN 20 20  CREATININE 1.02 1.15  CALCIUM 8.2* 8.4*  PROT 7.3  --   BILITOT 0.8  --   ALKPHOS 197*  --   ALT 31  --   AST 27  --   GLUCOSE 209* 178*   No results found for: CKTOTAL, CKMB, CKMBINDEX, TROPONINI No results found for: CHOL No results found for: HDL No results found for: LDLCALC No results found for: TRIG No results found for: CHOLHDL No results found for: LDLDIRECT    Radiology: DG Chest 2 View  Result Date: 05/08/2021 CLINICAL DATA:  Sepsis. EXAM: CHEST - 2 VIEW COMPARISON:  January 16, 2021. FINDINGS: Status post coronary bypass graft. Stable cardiomegaly with central pulmonary vascular congestion is noted. Mild bilateral pulmonary edema may be present. No consolidative process is noted. Bony thorax is unremarkable. IMPRESSION: Stable cardiomegaly with mild central pulmonary vascular congestion and probable mild bilateral pulmonary edema. Electronically Signed   By: Marijo Conception M.D.   On: 05/08/2021 12:31   US Venous Img Lower Bilateral  Result Date: 05/08/2021 CLINICAL DATA:  Pain and swelling EXAM: Bilateral LOWER EXTREMITY VENOUS DOPPLER ULTRASOUND TECHNIQUE: Gray-scale sonography with compression, as well as color and duplex ultrasound, were performed to evaluate the deep venous system(s) from the level of the common femoral vein through the popliteal and proximal calf veins. COMPARISON:  None. FINDINGS: VENOUS Normal compressibility of the common femoral, superficial femoral, and popliteal  veins, as well as the visualized calf veins. Visualized portions of profunda femoral vein and great saphenous vein unremarkable. No filling defects to suggest DVT on grayscale or color Doppler imaging. Doppler waveforms show normal direction of venous flow, normal respiratory plasticity and response to augmentation. OTHER None. Limitations: Edema and habitus. IMPRESSION: Negative. Electronically Signed   By: Madie Reno.D.  On: 05/08/2021 21:19   DG Foot Complete Left  Result Date: 05/08/2021 CLINICAL DATA:  Bilateral foot wound EXAM: LEFT FOOT - COMPLETE 3+ VIEW COMPARISON:  01/10/2021 FINDINGS: No acute fracture is seen. Previous trans meta tarsal amputation third digit and partial amputation of fifth metatarsal. Cut margins appear smooth. No bony destructive change. No soft tissue emphysema. IMPRESSION: No acute osseous abnormality. Previous transmetatarsal amputation third digit and partial amputation of fifth metatarsal. Electronically Signed   By: Donavan Foil M.D.   On: 05/08/2021 23:06   DG Foot Complete Right  Result Date: 05/08/2021 CLINICAL DATA:  Foot wound EXAM: RIGHT FOOT COMPLETE - 3+ VIEW COMPARISON:  None. FINDINGS: No fracture is seen. Prior transmetatarsal amputation second digit and previous amputation of third digit at the level of the MTP joint. Smooth cut margins. Irregular arthropathy with erosive changes at the first IP joint of uncertain chronicity. There is soft tissue swelling. Negative for soft tissue emphysema. IMPRESSION: 1. Previous partial amputation of the second and third digits without acute findings here 2. Irregular arthropathy at the first IP joint with erosive changes of uncertain chronicity, correlate for signs of active infection here. Evaluation with MRI may be obtained as indicated. Electronically Signed   By: Donavan Foil M.D.   On: 05/08/2021 23:09    ECHO LVEF 60 to 65% 12/2020  TELEMETRY reviewed by me: atrial flutter with 2:1 conduction rate 119  EKG  reviewed by me: atrial flutter with 2:1 conduction 123  ASSESSMENT AND PLAN:  65yoM with a PMH significant for CAD s/p CABG 2006 and PCI of RCA 01/2020, HFpEF (LVEF 60 to 65% 12/2020), HTN, BPH, diabetes type 2, class III obesity, PAD s/p left third toe amputation, chronic venous stasis on Unna boots,  A. fib on apixaban, amiodarone and digoxin, sleep apnea, chronic ulcer left foot, OSA on CPAP who presents to the emergency room for evaluation of worsening bilateral lower extremity edema associated with weeping of the extremities.  Cardiology was consulted for assistance with diuresis and his heart failure.  #HFpEF (LVEF 60 to 65% 12/2020) BNP elevated to 531, increased from hospitalization in September 197 Chest x-ray revealed mild central pulmonary vascular congestion and probable mild bilateral pulmonary edema. s/p Lasix 40 mg IV x2. -ordered Lasix 60 mg twice daily x 3 doses to assist with diuresis. Will continue to monitor.  -Echocardiogram performed and formal read is pending. -Encouraged fluid restrictions to 64 ounces per day, monitoring salt intake.  Monitor I's and O's while inpatient.  #PAD s/p left third toe amputation #Chronic lower extremity wounds associated with venous stasis -Appreciate vascular surgery and podiatry recommendations -Agree with supportive care as per primary team -plan as below  #Paroxysmal Atrial fibrillation on eliquis Current rhythm is atrial flutter with 2:1 conduction. CHA2DS2-VASc 5. Stopping PO amio and starting IV amio for 24 hours with goals to transition to PO later in hospital course as his rate is more controlled Continue digoxin 0.0625 mg once daily, Cardizem CD 300 mg Eliquis on hold, consider initiating heparin in the interim as need for surgery is determined.   #CAD s/p CABG 2006 and PCI of RCA 01/2020 #HTN #Hyperlipidemia -Continue Imdur -Continue Zetia -Would recommend high intensity statin therapy however patient has reported allergy of  myalgias to statins -Plan as above  #type 2 diabetes Poorly controlled. A1c on admission 9.2, mgmt per primary team   This patient was seen and examined by Dr. Donnelly Angelica and he is in agreement with this plan of  care.   Signed: Tristan Schroeder , PA-C 05/09/2021, 10:14 AM

## 2021-05-09 NOTE — Consult Note (Signed)
ORTHOPAEDIC CONSULTATION  REQUESTING PHYSICIAN: Wouk, Ailene Rud, MD  Chief Complaint: Nonhealing ulcers both feet  HPI: [name]. is a 66 y.o. male who complains of multiple nonhealing ulcers to the lower extremities.  Known to our practice for some time.  He admitted with whole body fluid overload and tachycardia.  Has had drainage to lower leg ulcerations.  Past Medical History:  Diagnosis Date   Asthma    Coronary artery disease    Depression    Diabetes mellitus without complication ([place])    Gout    History anabolic steroid use    Hyperlipidemia    Hypertension    Hypogonadism in male    Morbid obesity ([place])    Myocardial infarction ([place])    Peripheral vascular disease ([place])    Perirectal abscess    Pleurisy    Sleep apnea    CPAP at night, no oxygen   Varicella    Past Surgical History:  Procedure Laterality Date   ABDOMINAL AORTIC ANEURYSM REPAIR     ACHILLES TENDON SURGERY Left 01/10/2021   Procedure: ACHILLES LENGTHENING/KIDNER;  Surgeon: Caroline More, DPM;  Location: [facility];  Service: Podiatry;  Laterality: Left;   AMPUTATION TOE Right 02/10/2016   Procedure: AMPUTATION TOE 3RD TOE;  Surgeon: Samara Deist, DPM;  Location: [facility];  Service: Podiatry;  Laterality: Right;   AMPUTATION TOE Left 02/24/2020   Procedure: AMPUTATION TOE;  Surgeon: Caroline More, DPM;  Location: [facility];  Service: Podiatry;  Laterality: Left;   APPLICATION OF WOUND VAC Left 02/29/2020   Procedure: APPLICATION OF WOUND VAC;  Surgeon: Caroline More, DPM;  Location: [facility];  Service: Podiatry;  Laterality: Left;   COLONOSCOPY WITH PROPOFOL [name] 11/18/2015   Procedure: COLONOSCOPY WITH PROPOFOL;  Surgeon: Manya Silvas, MD;  Location: [facility];  Service: Endoscopy;  Laterality: [name];   CORONARY ARTERY BYPASS GRAFT     CORONARY STENT INTERVENTION [name] 02/02/2020   Procedure: CORONARY STENT INTERVENTION;  Surgeon: Isaias Cowman, MD;  Location: [place] [facility];  Service: Cardiovascular;  Laterality: [name];   IRRIGATION AND DEBRIDEMENT FOOT Left 02/29/2020   Procedure: IRRIGATION AND DEBRIDEMENT FOOT;  Surgeon: Caroline More, DPM;  Location: [facility];  Service: Podiatry;  Laterality: Left;   IRRIGATION AND DEBRIDEMENT FOOT Left 02/24/2020   Procedure: IRRIGATION AND DEBRIDEMENT FOOT;  Surgeon: Caroline More, DPM;  Location: [facility];  Service: Podiatry;  Laterality: Left;   KNEE ARTHROSCOPY     LEFT HEART CATH AND CORS/GRAFTS ANGIOGRAPHY [name] 02/02/2020   Procedure: LEFT HEART CATH AND CORS/GRAFTS ANGIOGRAPHY;  Surgeon: Teodoro Spray, MD;  Location: [place] [facility];  Service: Cardiovascular;  Laterality: [name];   LOWER EXTREMITY ANGIOGRAPHY Left 02/25/2020   Procedure: Lower Extremity Angiography;  Surgeon: Algernon Huxley, MD;  Location: [place] [facility];  Service: Cardiovascular;  Laterality: Left;   LOWER EXTREMITY ANGIOGRAPHY Left 01/04/2021   Procedure: LOWER EXTREMITY ANGIOGRAPHY;  Surgeon: Algernon Huxley, MD;  Location: [place] [facility];  Service: Cardiovascular;  Laterality: Left;   METATARSAL HEAD EXCISION Left 01/10/2021   Procedure: METATARSAL HEAD EXCISION - LEFT 5th;  Surgeon: Caroline More, DPM;  Location: [facility];  Service: Podiatry;  Laterality: Left;   PERIPHERAL VASCULAR CATHETERIZATION Right 01/24/2016   Procedure: Lower Extremity Angiography;  Surgeon: Katha Cabal, MD;  Location: [place] [facility];  Service: Cardiovascular;  Laterality: Right;   PERIPHERAL VASCULAR CATHETERIZATION Right 01/25/2016   Procedure: Lower Extremity Angiography;  Surgeon: Belenda Cruise  Eloise Levels, MD;  Location: [place] [facility];  Service: Cardiovascular;  Laterality: Right;   TOE AMPUTATION     TONSILLECTOMY     Social History   Socioeconomic History   Marital status: Married    Spouse name: Not on file   Number of children: Not on file   Years of education: Not on file   Highest education level: Not on file  Occupational History   Not  on file  Tobacco Use   Smoking status: Former    Packs/day: 0.50    Years: 45.00    Pack years: 22.50    Types: Cigarettes    Quit date: 04/05/2015    Years since quitting: 6.0   Smokeless tobacco: Never  Vaping Use   Vaping Use: Every day  Substance and Sexual Activity   Alcohol use: Not Currently    Alcohol/week: 3.0 standard drinks    Types: 3 Glasses of wine per week   Drug use: No   Sexual activity: Not on file  Other Topics Concern   Not on file  Social History Narrative   Not on file   Social Determinants of Health   Financial Resource Strain: Not on file  Food Insecurity: Not on file  Transportation Needs: Not on file  Physical Activity: Not on file  Stress: Not on file  Social Connections: Not on file   History reviewed. No pertinent family history. Allergies  Allergen Reactions   Statins     Other reaction(s): Muscle Pain Causes legs to ache per pt   Prior to Admission medications   Medication Sig Start Date End Date Taking? Authorizing Provider  amiodarone (PACERONE) 200 MG tablet Take 1 tablet (200 mg total) by mouth daily. 01/16/21  Yes Bonnielee Haff, MD  apixaban (ELIQUIS) 5 MG TABS tablet Take 1 tablet (5 mg total) by mouth 2 (two) times daily. 01/26/16  Yes Stegmayer, Janalyn Harder, PA-C  digoxin 62.5 MCG TABS Take 0.0625 mg by mouth daily. 01/16/21  Yes Bonnielee Haff, MD  diltiazem (CARDIZEM CD) 300 MG 24 hr capsule Take 1 capsule (300 mg total) by mouth daily. 01/16/21  Yes Bonnielee Haff, MD  ezetimibe (ZETIA) 10 MG tablet Take 10 mg by mouth daily. 12/19/20 12/19/21 Yes [provider]  ferrous sulfate 325 (65 FE) MG tablet Take 325 mg by mouth daily with breakfast.   Yes [provider]  furosemide (LASIX) 20 MG tablet Take 40 mg by mouth daily.   Yes [provider]  gabapentin (NEURONTIN) 300 MG capsule Take 300 mg by mouth at bedtime. 12/02/20  Yes [provider]  insulin aspart (NOVOLOG) 100 UNIT/ML injection  Inject 9 Units into the skin 3 (three) times daily with meals. Patient taking differently: Inject 15 Units into the skin 3 (three) times daily with meals. SSI extra if BG high from 3 to 15 units 01/16/21  Yes Bonnielee Haff, MD  isosorbide mononitrate (IMDUR) 60 MG 24 hr tablet Take 60 mg by mouth 2 (two) times daily. 04/21/21  Yes [provider]  LANTUS SOLOSTAR 100 UNIT/ML Solostar Pen Inject 50 Units into the skin 2 (two) times daily. Patient taking differently: Inject 14 Units into the skin in the morning. 01/16/21  Yes Bonnielee Haff, MD  metFORMIN (GLUCOPHAGE) 1000 MG tablet Take 1,000 mg by mouth 2 (two) times daily.  12/02/17  Yes [provider]  metoprolol tartrate (LOPRESSOR) 50 MG tablet Take 50 mg by mouth 2 (two) times daily. 04/21/21  Yes [provider]  Multiple Vitamins-Minerals (MULTIVITAMIN WITH MINERALS) tablet Take 1 tablet by mouth daily.   Yes [provider]  nitroGLYCERIN (NITROSTAT) 0.4 MG SL tablet Place 0.4 mg under the tongue daily as needed. 01/25/20  Yes [provider]  potassium chloride (KLOR-CON) 10 MEQ tablet Take 10 mEq by mouth daily. 03/21/21  Yes [provider]  pramipexole (MIRAPEX) 1 MG tablet Take 2 mg by mouth at bedtime. 11/09/20  Yes [provider]  primidone (MYSOLINE) 250 MG tablet Take 250 mg by mouth 2 (two) times daily. 01/23/20  Yes [provider]  tamsulosin (FLOMAX) 0.4 MG CAPS capsule Take 0.4 mg by mouth daily.   Yes [provider]  vitamin B-12 (CYANOCOBALAMIN) 1000 MCG tablet Take 10,000 mcg by mouth daily. Energy shot   Yes [provider]  zinc gluconate 50 MG tablet Take 50 mg by mouth daily.   Yes [provider]  atorvastatin (LIPITOR) 40 MG tablet Take 1 tablet (40 mg total) by mouth daily. 02/03/20   Teodoro Spray, MD  metoprolol tartrate (LOPRESSOR) 25 MG tablet Take 1 tablet (25 mg total) by mouth 2 (two) times daily. Patient not  taking: Reported on 05/09/2021 01/17/21   Bonnielee Haff, MD  OZEMPIC, 0.25 OR 0.5 MG/DOSE, 2 MG/1.5ML SOPN Inject 0.25 mg into the skin once a week. Patient not taking: Reported on 05/09/2021 01/16/21   Bonnielee Haff, MD   DG Chest 2 View  Result Date: 05/08/2021 CLINICAL DATA:  Sepsis. EXAM: CHEST - 2 VIEW COMPARISON:  January 16, 2021. FINDINGS: Status post coronary bypass graft. Stable cardiomegaly with central pulmonary vascular congestion is noted. Mild bilateral pulmonary edema may be present. No consolidative process is noted. Bony thorax is unremarkable. IMPRESSION: Stable cardiomegaly with mild central pulmonary vascular congestion and probable mild bilateral pulmonary edema. Electronically Signed   By: Marijo Conception M.D.   On: 05/08/2021 12:31   US Venous Img Lower Bilateral  Result Date: 05/08/2021 CLINICAL DATA:  Pain and swelling EXAM: Bilateral LOWER EXTREMITY VENOUS DOPPLER ULTRASOUND TECHNIQUE: Gray-scale sonography with compression, as well as color and duplex ultrasound, were performed to evaluate the deep venous system(s) from the level of the common femoral vein through the popliteal and proximal calf veins. COMPARISON:  None. FINDINGS: VENOUS Normal compressibility of the common femoral, superficial femoral, and popliteal veins, as well as the visualized calf veins. Visualized portions of profunda femoral vein and great saphenous vein unremarkable. No filling defects to suggest DVT on grayscale or color Doppler imaging. Doppler waveforms show normal direction of venous flow, normal respiratory plasticity and response to augmentation. OTHER None. Limitations: Edema and habitus. IMPRESSION: Negative. Electronically Signed   By: Donavan Foil M.D.   On: 05/08/2021 21:19   DG Foot Complete Left  Result Date: 05/08/2021 CLINICAL DATA:  Bilateral foot wound EXAM: LEFT FOOT - COMPLETE 3+ VIEW COMPARISON:  01/10/2021 FINDINGS: No acute fracture is seen. Previous trans meta tarsal  amputation third digit and partial amputation of fifth metatarsal. Cut margins appear smooth. No bony destructive change. No soft tissue emphysema. IMPRESSION: No acute osseous abnormality. Previous transmetatarsal amputation third digit and partial amputation of fifth metatarsal. Electronically Signed   By: Donavan Foil M.D.   On: 05/08/2021 23:06   DG Foot Complete Right  Result Date: 05/08/2021 CLINICAL DATA:  Foot wound EXAM: RIGHT FOOT COMPLETE - 3+ VIEW COMPARISON:  None. FINDINGS: No fracture is seen. Prior transmetatarsal amputation second digit and previous amputation of third digit  at the level of the MTP joint. Smooth cut margins. Irregular arthropathy with erosive changes at the first IP joint of uncertain chronicity. There is soft tissue swelling. Negative for soft tissue emphysema. IMPRESSION: 1. Previous partial amputation of the second and third digits without acute findings here 2. Irregular arthropathy at the first IP joint with erosive changes of uncertain chronicity, correlate for signs of active infection here. Evaluation with MRI may be obtained as indicated. Electronically Signed   By: Donavan Foil M.D.   On: 05/08/2021 23:09    Positive ROS: All other systems have been reviewed and were otherwise negative with the exception of those mentioned in the HPI and as above.  12 point ROS was performed.  Physical Exam: General: Alert and oriented.  No apparent distress.  Vascular:  Left foot:Dorsalis Pedis:  absent Posterior Tibial:  absent  Right foot: Dorsalis Pedis:  absent Posterior Tibial:  absent absent pulses likely secondary to severe edema.  Neuro:absent  Derm: Dorsal aspect of the interphalangeal joint of the right great toe is a superficial ulceration.  Measures approximately a centimeter and half.  No signs of active infection.  Plantar aspect of the left heel has a superficial necrotic ulceration.  I attempted to remove some of the overlying necrotic tissue and no  signs of active purulent drainage or infection.  This does not probe with a blunt probe today.  No surrounding erythema to the heel.  Ortho/MS: Severe diffuse bilateral lower extremity edema.  He has multiple areas of draining blister fluid likely from his fluid overload and venous insufficiency.  Vascular has evaluated.  Assessment: Superficial necrotic left heel ulceration with superficial ulceration right great toe Diabetes with neuropathy Severe venous stasis with venous ulcerations bilateral lower extremities   Plan: The left heel likely need superficial bedside debridement of the necrotic tissue.  I will discussed this with Dr. Luana Shu.  I unfortunately was not able to perform this today at bedside.  The right great toe ulceration is stable.  Will write for padded dressings to both areas for now.  Continue with elevation of the lower extremities.  Vascular has recommended Unna boots for the venous stasis and venous ulcerations to the lower legs.  No need for vascular invention at this time.  Personally evaluated the x-rays to bilateral lower extremities.  Air of concern on the interphalangeal joint of the right great toe looks to be chronic changes.  There is no signs of obvious acute erosive changes.  Clinically this does not appear to be infected.  Left heel shows the ulcerative site.  No erosive changes or signs of osteomyelitis.    [name], DPM Cell [phone]   05/09/2021 2:08 PM

## 2021-05-09 NOTE — Consult Note (Signed)
[facility] Consult Note  MRN : [id number]  [name]. is a 66 y.o. (11-29-1955) male who presents with chief complaint of  Chief Complaint  Patient presents with   Leg Swelling   Tachycardia  .  History of Present Illness: I am asked to see the patient by Dr. Si Raider for evaluation of multiple nonhealing ulcerations of both lower extremities.  The patient is known to our practice and about 4 5 months ago had a left lower extremity angiogram for nonhealing ulcerations on that side prior to his toe amputation.  This is ultimately healed reasonably well, but he has a large left heel ulceration as well as skin breakdown in the calf and lower leg from marked swelling and stasis dermatitis changes present.  He also has nonhealing ulceration on the tip of the right great toe as well as on the first metatarsal head area.  These are clean with good granulation tissue.  He also has right lower extremity calf ulcerations from skin breakdown and weeping from the swelling and marked stasis dermatitis.  There are some erythema in both lower extremities.  He is admitted with anasarca and whole body fluid overload as well as tachycardia.  The increased swelling seem to worsen the ulcerations on his legs.  He is not particularly mobile.  His wife says he does try to elevate his legs.  He complains of pain in his legs nearly all the time.  Current Facility-Administered Medications  Medication Dose Route Frequency Provider Last Rate Last Admin   0.9 %  sodium chloride infusion  250 mL Intravenous PRN Athena Masse, MD       acetaminophen (TYLENOL) tablet 650 mg  650 mg Oral Q4H PRN Athena Masse, MD       amiodarone (PACERONE) tablet 200 mg  200 mg Oral Daily Gwynne Edinger, MD   200 mg at 05/09/21 1157   aspirin EC tablet 81 mg  81 mg Oral Daily Gwynne Edinger, MD   81 mg at 05/09/21 2620   atorvastatin (LIPITOR) tablet 40 mg  40 mg Oral Daily Wouk, Ailene Rud, MD       ceFEPIme (MAXIPIME) 2 g in sodium chloride 0.9 % 100 mL IVPB  2 g Intravenous Q8H Renda Rolls, [place]   Stopped at 05/09/21 3559   digoxin (LANOXIN) tablet 0.0625 mg  0.0625 mg Oral Daily Gwynne Edinger, MD   0.0625 mg at 05/09/21 0949   diltiazem (CARDIZEM CD) 24 hr capsule 300 mg  300 mg Oral Daily Gwynne Edinger, MD   300 mg at 05/09/21 0949   enoxaparin (LOVENOX) injection 80 mg  0.5 mg/kg Subcutaneous Q24H Athena Masse, MD   80 mg at 05/09/21 7416   ezetimibe (ZETIA) tablet 10 mg  10 mg Oral Daily Gwynne Edinger, MD   10 mg at 05/09/21 0954   furosemide (LASIX) injection 40 mg  40 mg Intravenous Q12H Athena Masse, MD   40 mg at 05/09/21 0805   gabapentin (NEURONTIN) capsule 300 mg  300 mg Oral Daily Athena Masse, MD       HYDROcodone-acetaminophen (NORCO/VICODIN) 5-325 MG per tablet 1 tablet  1 tablet Oral Q4H PRN Athena Masse, MD   1 tablet at 05/09/21 0805   insulin aspart (novoLOG) injection 0-20 Units  0-20 Units Subcutaneous TID WC Athena Masse, MD   4 Units at 05/09/21 0806   insulin aspart (novoLOG) injection  0-5 Units  0-5 Units Subcutaneous QHS Athena Masse, MD       insulin glargine-yfgn [facility]) injection 50 Units  50 Units Subcutaneous BID Renda Rolls, RPH   50 Units at 05/09/21 5956   isosorbide mononitrate (IMDUR) 24 hr tablet 60 mg  60 mg Oral BID Gwynne Edinger, MD   60 mg at 05/09/21 3875   metroNIDAZOLE (FLAGYL) IVPB 500 mg  500 mg Intravenous Q12H Wouk, Ailene Rud, MD       nitroGLYCERIN (NITROSTAT) SL tablet 0.4 mg  0.4 mg Sublingual Q5 min PRN Athena Masse, MD       ondansetron [facility]) injection 4 mg  4 mg Intravenous Q6H PRN Athena Masse, MD       pramipexole (MIRAPEX) tablet 2 mg  2 mg Oral QHS Wouk, Ailene Rud, MD       primidone (MYSOLINE) tablet 250 mg  250 mg Oral BID Gwynne Edinger, MD   250 mg at 05/09/21 0949   sodium chloride flush (NS) 0.9 % injection 3 mL  3 mL Intravenous Q12H Judd Gaudier V, MD   3 mL at 05/09/21 0807   sodium chloride flush (NS) 0.9 % injection 3 mL  3 mL Intravenous PRN Athena Masse, MD       tamsulosin [facility]) capsule 0.4 mg  0.4 mg Oral Daily Judd Gaudier V, MD   0.4 mg at 05/09/21 0805   vancomycin (VANCOREADY) IVPB 1500 mg/300 mL  1,500 mg Intravenous Q12H Renda Rolls, RPH       Current Outpatient Medications  Medication Sig Dispense Refill   amiodarone (PACERONE) 200 MG tablet Take 1 tablet (200 mg total) by mouth daily.     apixaban (ELIQUIS) 5 MG TABS tablet Take 1 tablet (5 mg total) by mouth 2 (two) times daily. 60 tablet 5   digoxin 62.5 MCG TABS Take 0.0625 mg by mouth daily.     diltiazem (CARDIZEM CD) 300 MG 24 hr capsule Take 1 capsule (300 mg total) by mouth daily.     ezetimibe (ZETIA) 10 MG tablet Take 10 mg by mouth daily.     ferrous sulfate 325 (65 FE) MG tablet Take 325 mg by mouth daily with breakfast.     furosemide (LASIX) 20 MG tablet Take 40 mg by mouth daily.     gabapentin (NEURONTIN) 300 MG capsule Take 300 mg by mouth at bedtime.     insulin aspart (NOVOLOG) 100 UNIT/ML injection Inject 9 Units into the skin 3 (three) times daily with meals. (Patient taking differently: Inject 15 Units into the skin 3 (three) times daily with meals. SSI extra if BG high from 3 to 15 units) 10 mL 11   isosorbide mononitrate (IMDUR) 60 MG 24 hr tablet Take 60 mg by mouth 2 (two) times daily.     LANTUS SOLOSTAR 100 UNIT/ML Solostar Pen Inject 50 Units into the skin 2 (two) times daily. (Patient taking differently: Inject 14 Units into the skin in the morning.) 15 mL 11   metFORMIN (GLUCOPHAGE) 1000 MG tablet Take 1,000 mg by mouth 2 (two) times daily.      metoprolol tartrate (LOPRESSOR) 50 MG tablet Take 50 mg by mouth 2 (two) times daily.     Multiple Vitamins-Minerals (MULTIVITAMIN WITH MINERALS) tablet Take 1 tablet by mouth daily.     nitroGLYCERIN (NITROSTAT) 0.4 MG SL tablet Place 0.4 mg under the tongue daily as needed.      potassium chloride (KLOR-CON)  10 MEQ tablet Take 10 mEq by mouth daily.     pramipexole (MIRAPEX) 1 MG tablet Take 2 mg by mouth at bedtime.     primidone (MYSOLINE) 250 MG tablet Take 250 mg by mouth 2 (two) times daily.     tamsulosin (FLOMAX) 0.4 MG CAPS capsule Take 0.4 mg by mouth daily.     vitamin B-12 (CYANOCOBALAMIN) 1000 MCG tablet Take 10,000 mcg by mouth daily. Energy shot     zinc gluconate 50 MG tablet Take 50 mg by mouth daily.     atorvastatin (LIPITOR) 40 MG tablet Take 1 tablet (40 mg total) by mouth daily. 30 tablet 0   metoprolol tartrate (LOPRESSOR) 25 MG tablet Take 1 tablet (25 mg total) by mouth 2 (two) times daily. (Patient not taking: Reported on 05/09/2021)     OZEMPIC, 0.25 OR 0.5 MG/DOSE, 2 MG/1.5ML SOPN Inject 0.25 mg into the skin once a week. (Patient not taking: Reported on 05/09/2021)     Facility-Administered Medications Ordered in Other Encounters  Medication Dose Route Frequency Provider Last Rate Last Admin   sodium chloride flush (NS) 0.9 % injection 3 mL  3 mL Intravenous Q12H Teodoro Spray, MD        Past Medical History:  Diagnosis Date   Asthma    Coronary artery disease    Depression    Diabetes mellitus without complication ([place])    Gout    History anabolic steroid use    Hyperlipidemia    Hypertension    Hypogonadism in male    Morbid obesity ([place])    Myocardial infarction ([place])    Peripheral vascular disease ([place])    Perirectal abscess    Pleurisy    Sleep apnea    CPAP at night, no oxygen   Varicella     Past Surgical History:  Procedure Laterality Date   ABDOMINAL AORTIC ANEURYSM REPAIR     ACHILLES TENDON SURGERY Left 01/10/2021   Procedure: ACHILLES LENGTHENING/KIDNER;  Surgeon: Caroline More, DPM;  Location: [facility];  Service: Podiatry;  Laterality: Left;   AMPUTATION TOE Right 02/10/2016   Procedure: AMPUTATION TOE 3RD TOE;  Surgeon: Samara Deist, DPM;  Location: [facility];  Service: Podiatry;  Laterality: Right;    AMPUTATION TOE Left 02/24/2020   Procedure: AMPUTATION TOE;  Surgeon: Caroline More, DPM;  Location: [facility];  Service: Podiatry;  Laterality: Left;   APPLICATION OF WOUND VAC Left 02/29/2020   Procedure: APPLICATION OF WOUND VAC;  Surgeon: Caroline More, DPM;  Location: [facility];  Service: Podiatry;  Laterality: Left;   COLONOSCOPY WITH PROPOFOL [name] 11/18/2015   Procedure: COLONOSCOPY WITH PROPOFOL;  Surgeon: Manya Silvas, MD;  Location: [facility];  Service: Endoscopy;  Laterality: [name];   CORONARY ARTERY BYPASS GRAFT     CORONARY STENT INTERVENTION [name] 02/02/2020   Procedure: CORONARY STENT INTERVENTION;  Surgeon: Isaias Cowman, MD;  Location: [facility];  Service: Cardiovascular;  Laterality: [name];   IRRIGATION AND DEBRIDEMENT FOOT Left 02/29/2020   Procedure: IRRIGATION AND DEBRIDEMENT FOOT;  Surgeon: Caroline More, DPM;  Location: [facility];  Service: Podiatry;  Laterality: Left;   IRRIGATION AND DEBRIDEMENT FOOT Left 02/24/2020   Procedure: IRRIGATION AND DEBRIDEMENT FOOT;  Surgeon: Caroline More, DPM;  Location: [facility];  Service: Podiatry;  Laterality: Left;   KNEE ARTHROSCOPY     LEFT HEART CATH AND CORS/GRAFTS ANGIOGRAPHY [name] 02/02/2020   Procedure: LEFT HEART CATH AND CORS/GRAFTS ANGIOGRAPHY;  Surgeon: Teodoro Spray, MD;  Location:  [facility];  Service: Cardiovascular;  Laterality: [name];   LOWER EXTREMITY ANGIOGRAPHY Left 02/25/2020   Procedure: Lower Extremity Angiography;  Surgeon: Algernon Huxley, MD;  Location: [facility] [place] [facility];  Service: Cardiovascular;  Laterality: Left;   LOWER EXTREMITY ANGIOGRAPHY Left 01/04/2021   Procedure: LOWER EXTREMITY ANGIOGRAPHY;  Surgeon: Algernon Huxley, MD;  Location: [place] [facility];  Service: Cardiovascular;  Laterality: Left;   METATARSAL HEAD EXCISION Left 01/10/2021   Procedure: METATARSAL HEAD EXCISION - LEFT 5th;  Surgeon: Caroline More, DPM;  Location: [facility];  Service: Podiatry;  Laterality: Left;    PERIPHERAL VASCULAR CATHETERIZATION Right 01/24/2016   Procedure: Lower Extremity Angiography;  Surgeon: Katha Cabal, MD;  Location: [facility];  Service: Cardiovascular;  Laterality: Right;   PERIPHERAL VASCULAR CATHETERIZATION Right 01/25/2016   Procedure: Lower Extremity Angiography;  Surgeon: Katha Cabal, MD;  Location: [facility];  Service: Cardiovascular;  Laterality: Right;   TOE AMPUTATION     TONSILLECTOMY       Social History   Tobacco Use   Smoking status: Former    Packs/day: 0.50    Years: 45.00    Pack years: 22.50    Types: Cigarettes    Quit date: 04/05/2015    Years since quitting: 6.0   Smokeless tobacco: Never  Vaping Use   Vaping Use: Every day  Substance Use Topics   Alcohol use: Not Currently    Alcohol/week: 3.0 standard drinks    Types: 3 Glasses of wine per week   Drug use: No    Family History No bleeding disorders, clotting disorders, autoimmune diseases, or aneurysms  Allergies  Allergen Reactions   Statins     Other reaction(s): Muscle Pain Causes legs to ache per pt     REVIEW OF SYSTEMS (Negative unless checked)  Constitutional: [] Weight loss  [] Fever  [] Chills Cardiac: [] Chest pain   [] Chest pressure   [] Palpitations   [] Shortness of breath when laying flat   [] Shortness of breath at rest   [x] Shortness of breath with exertion. Vascular:  [x] Pain in legs with walking   [x] Pain in legs at rest   [x] Pain in legs when laying flat   [] Claudication   [] Pain in feet when walking  [] Pain in feet at rest  [] Pain in feet when laying flat   [] History of DVT   [] Phlebitis   [x] Swelling in legs   [] Varicose veins   [x] Non-healing ulcers Pulmonary:   [] Uses home oxygen   [] Productive cough   [] Hemoptysis   [] Wheeze  [x] COPD   [x] Asthma Neurologic:  [] Dizziness  [] Blackouts   [] Seizures   [] History of stroke   [] History of TIA  [] Aphasia   [] Temporary blindness   [] Dysphagia   [] Weakness or numbness in arms   [] Weakness or  numbness in legs Musculoskeletal:  [x] Arthritis   [] Joint swelling   [] Joint pain   [] Low back pain Hematologic:  [] Easy bruising  [] Easy bleeding   [] Hypercoagulable state   [] Anemic  [] Hepatitis Gastrointestinal:  [] Blood in stool   [] Vomiting blood  [x] Gastroesophageal reflux/heartburn   [] Difficulty swallowing. Genitourinary:  [] Chronic kidney disease   [] Difficult urination  [] Frequent urination  [] Burning with urination   [] Blood in urine Skin:  [] Rashes   [x] Ulcers   [x] Wounds Psychological:  [] History of anxiety   []  History of major depression.  Physical Examination  Vitals:   05/09/21 0500 05/09/21 0600 05/09/21 0630 05/09/21 0700  BP: 129/83 105/74 128/81 131/79  Pulse: Marland Kitchen)  121 (!) 120 (!) 120 (!) 118  Resp: 20 16 (!) 26 16  Temp:      TempSrc:      SpO2: 97% 93% 98% 98%  Weight:      Height:       Body mass index is 42.6 kg/m. Gen:  WD/WN, NAD. Obese. Appears older than stated age. Head: [place]/AT, No temporalis wasting.  Ear/Nose/Throat: Hearing grossly intact, nares w/o erythema or drainage, oropharynx w/o Erythema/Exudate Eyes: Sclera non-icteric, conjunctiva clear Neck: Trachea midline.  No JVD.  Pulmonary:  Good air movement, respirations not labored, equal bilaterally.  Cardiac: RRR, normal S1, S2. Vascular:  Vessel Right Left  Radial Palpable Palpable                          PT Not Palpable Not Palpable  DP Trace Palpable 1+ Palpable   Gastrointestinal: soft, non-tender/non-distended. No guarding/reflex.  Musculoskeletal: M/S 5/5 throughout.  Extremities without ischemic changes.  Multiple ulcerations as described below.  Marked stasis dermatitis changes present bilaterally.  2+ right lower extremity edema and 3+ left lower extremity edema is present Neurologic: Sensation grossly intact in extremities.  Symmetrical.  Speech is fluent. Motor exam as listed above. Psychiatric: Judgment intact, Mood & affect appropriate for pt's clinical  situation. Dermatologic: Multiple wounds present on both lower extremities.  He has a pressure ulcer on the left heel that is about the size of a quarter.  He has a small ulceration on the right great toe that is about the size of a dime as well as a crack on the first metatarsal head on the right foot that is about 2 cm in length.  He also has some superficial calf ulcerations from his swelling and chronic stasis dermatitis changes present on both lower extremities.  Mild erythema is present in the calves bilaterally.     CBC Lab Results  Component Value Date   WBC 8.8 05/08/2021   HGB 11.7 (L) 05/08/2021   HCT 39.2 05/08/2021   MCV 95.6 05/08/2021   PLT 289 05/08/2021    BMET    Component Value Date/Time   NA 136 05/09/2021 0720   NA 141 07/12/2011 0919   K 4.3 05/09/2021 0720   K 4.4 07/12/2011 0919   CL 96 (L) 05/09/2021 0720   CL 102 07/12/2011 0919   CO2 32 05/09/2021 0720   CO2 29 07/12/2011 0919   GLUCOSE 178 (H) 05/09/2021 0720   GLUCOSE 76 07/12/2011 0919   BUN 20 05/09/2021 0720   BUN 16 05/05/2013 1022   CREATININE 1.15 05/09/2021 0720   CREATININE 0.71 05/05/2013 1022   CALCIUM 8.4 (L) 05/09/2021 0720   CALCIUM 8.8 07/12/2011 0919   GFRNONAA >60 05/09/2021 0720   GFRNONAA >60 05/05/2013 1022   GFRAA >60 01/06/2020 1853   GFRAA >60 05/05/2013 1022   Estimated Creatinine Clearance: 103.3 mL/min (by C-G formula based on SCr of 1.15 mg/dL).  COAG Lab Results  Component Value Date   INR 1.2 05/08/2021   INR 1.2 01/05/2021   INR 1.0 01/03/2021    Radiology DG Chest 2 View  Result Date: 05/08/2021 CLINICAL DATA:  Sepsis. EXAM: CHEST - 2 VIEW COMPARISON:  January 16, 2021. FINDINGS: Status post coronary bypass graft. Stable cardiomegaly with central pulmonary vascular congestion is noted. Mild bilateral pulmonary edema may be present. No consolidative process is noted. Bony thorax is unremarkable. IMPRESSION: Stable cardiomegaly with mild central pulmonary  vascular congestion and probable  mild bilateral pulmonary edema. Electronically Signed   By: Marijo Conception M.D.   On: 05/08/2021 12:31   US Venous Img Lower Bilateral  Result Date: 05/08/2021 CLINICAL DATA:  Pain and swelling EXAM: Bilateral LOWER EXTREMITY VENOUS DOPPLER ULTRASOUND TECHNIQUE: Gray-scale sonography with compression, as well as color and duplex ultrasound, were performed to evaluate the deep venous system(s) from the level of the common femoral vein through the popliteal and proximal calf veins. COMPARISON:  None. FINDINGS: VENOUS Normal compressibility of the common femoral, superficial femoral, and popliteal veins, as well as the visualized calf veins. Visualized portions of profunda femoral vein and great saphenous vein unremarkable. No filling defects to suggest DVT on grayscale or color Doppler imaging. Doppler waveforms show normal direction of venous flow, normal respiratory plasticity and response to augmentation. OTHER None. Limitations: Edema and habitus. IMPRESSION: Negative. Electronically Signed   By: Donavan Foil M.D.   On: 05/08/2021 21:19   DG Foot Complete Left  Result Date: 05/08/2021 CLINICAL DATA:  Bilateral foot wound EXAM: LEFT FOOT - COMPLETE 3+ VIEW COMPARISON:  01/10/2021 FINDINGS: No acute fracture is seen. Previous trans meta tarsal amputation third digit and partial amputation of fifth metatarsal. Cut margins appear smooth. No bony destructive change. No soft tissue emphysema. IMPRESSION: No acute osseous abnormality. Previous transmetatarsal amputation third digit and partial amputation of fifth metatarsal. Electronically Signed   By: Donavan Foil M.D.   On: 05/08/2021 23:06   DG Foot Complete Right  Result Date: 05/08/2021 CLINICAL DATA:  Foot wound EXAM: RIGHT FOOT COMPLETE - 3+ VIEW COMPARISON:  None. FINDINGS: No fracture is seen. Prior transmetatarsal amputation second digit and previous amputation of third digit at the level of the MTP joint. Smooth cut  margins. Irregular arthropathy with erosive changes at the first IP joint of uncertain chronicity. There is soft tissue swelling. Negative for soft tissue emphysema. IMPRESSION: 1. Previous partial amputation of the second and third digits without acute findings here 2. Irregular arthropathy at the first IP joint with erosive changes of uncertain chronicity, correlate for signs of active infection here. Evaluation with MRI may be obtained as indicated. Electronically Signed   By: Donavan Foil M.D.   On: 05/08/2021 23:09      Assessment/Plan 1.  PAD with ulcerations throughout both lower extremities.  His perfusion was fairly normal a few months ago and I suspect that arterial insufficiency is not the major contributing factor to the new ulcerations.  It is more pressure, volume overload, and swelling.  I will hold off on angiogram at this time as I think the yield is fairly low, but if podiatry or other services feel that this is essential we can do an angiogram at any point during this admission on either lower extremity.  I would try to elevate his legs and consider Unna boot wraps for the calf ulcerations and skin breakdown.  I will defer the management of the foot wounds to podiatry. 2.  Volume overload.  Has worsened his leg swelling and ulcerations on the calves.  Likely multifactorial.  His large-size is a factor as well. 3.  Diabetes. blood glucose control important in reducing the progression of atherosclerotic disease. Also, involved in wound healing. On appropriate medications. 4.  Hypertension. blood pressure control important in reducing the progression of atherosclerotic disease. On appropriate oral medications.    [name], MD  05/09/2021 10:03 AM    This note was created with Dragon medical transcription system.  Any error is purely  unintentional

## 2021-05-09 NOTE — ED Notes (Signed)
Md made aware of patient leg and groin wounds , awaiting orders

## 2021-05-10 ENCOUNTER — Inpatient Hospital Stay
Admit: 2021-05-10 | Discharge: 2021-05-10 | Disposition: A | Discharge status: Home / Self Care | Payer: Medicare Other | Attending: Cardiology | Admitting: Cardiology

## 2021-05-10 LAB — CBG MONITORING, ED
Glucose-Capillary: 192 mg/dL — ABNORMAL HIGH (ref 70–99)
Glucose-Capillary: 199 mg/dL — ABNORMAL HIGH (ref 70–99)
Glucose-Capillary: 291 mg/dL — ABNORMAL HIGH (ref 70–99)

## 2021-05-10 LAB — APTT
aPTT: 42 seconds — ABNORMAL HIGH (ref 24–36)
aPTT: 49 seconds — ABNORMAL HIGH (ref 24–36)
aPTT: 49 seconds — ABNORMAL HIGH (ref 24–36)

## 2021-05-10 LAB — CBC
HCT: 37 % — ABNORMAL LOW (ref 39.0–52.0)
Hemoglobin: 11.3 g/dL — ABNORMAL LOW (ref 13.0–17.0)
MCH: 28.6 pg (ref 26.0–34.0)
MCHC: 30.5 g/dL (ref 30.0–36.0)
MCV: 93.7 fL (ref 80.0–100.0)
Platelets: 281 10*3/uL (ref 150–400)
RBC: 3.95 MIL/uL — ABNORMAL LOW (ref 4.22–5.81)
RDW: 15 % (ref 11.5–15.5)
WBC: 7 10*3/uL (ref 4.0–10.5)
nRBC: 0 % (ref 0.0–0.2)

## 2021-05-10 LAB — BASIC METABOLIC PANEL
Anion gap: 7 (ref 5–15)
BUN: 23 mg/dL (ref 8–23)
CO2: 31 mmol/L (ref 22–32)
Calcium: 8.2 mg/dL — ABNORMAL LOW (ref 8.9–10.3)
Chloride: 100 mmol/L (ref 98–111)
Creatinine, Ser: 1.14 mg/dL (ref 0.61–1.24)
GFR, Estimated: >60 mL/min (ref >60)
Glucose, Bld: 192 mg/dL — ABNORMAL HIGH (ref 70–99)
Potassium: 4.3 mmol/L (ref 3.5–5.1)
Sodium: 138 mmol/L (ref 135–145)

## 2021-05-10 LAB — ECHOCARDIOGRAM COMPLETE
Height: 76 in
S' Lateral: 3.9 cm
Weight: 5600 oz

## 2021-05-10 LAB — HEPARIN LEVEL (UNFRACTIONATED): Heparin Unfractionated: 0.34 IU/mL (ref 0.30–0.70)

## 2021-05-10 LAB — GLUCOSE, CAPILLARY: Glucose-Capillary: 322 mg/dL — ABNORMAL HIGH (ref 70–99)

## 2021-05-10 MED ORDER — HEPARIN BOLUS VIA INFUSION
3700.0000 [IU] | Freq: Once | INTRAVENOUS | Status: AC
  Administered 2021-05-10: 3700 [IU] via INTRAVENOUS
  Filled 2021-05-10: qty 3700

## 2021-05-10 MED ORDER — APIXABAN 5 MG PO TABS
5.0000 mg | ORAL_TABLET | Freq: Two times a day (BID) | ORAL | Status: DC
  Administered 2021-05-10 – 2021-05-16 (×12): 5 mg via ORAL
  Filled 2021-05-10 (×12): qty 1

## 2021-05-10 MED ORDER — TRIAMCINOLONE 0.1 % CREAM:EUCERIN CREAM 1:1
TOPICAL_CREAM | CUTANEOUS | Status: DC
  Filled 2021-05-10: qty 1

## 2021-05-10 MED ORDER — TRIAMCINOLONE ACETONIDE 0.1 % EX CREA
TOPICAL_CREAM | CUTANEOUS | Status: DC
  Filled 2021-05-10: qty 15

## 2021-05-10 MED ORDER — HYDROCERIN EX CREA
1.0000 "application " | TOPICAL_CREAM | CUTANEOUS | Status: DC
  Administered 2021-05-11 – 2021-05-15 (×2): 1 via TOPICAL
  Filled 2021-05-10: qty 113

## 2021-05-10 MED ORDER — METRONIDAZOLE 500 MG PO TABS
500.0000 mg | ORAL_TABLET | Freq: Two times a day (BID) | ORAL | Status: DC
  Administered 2021-05-10 – 2021-05-11 (×2): 500 mg via ORAL
  Filled 2021-05-10 (×3): qty 1

## 2021-05-10 MED ORDER — COLLAGENASE 250 UNIT/GM EX OINT
TOPICAL_OINTMENT | Freq: Every day | CUTANEOUS | Status: DC
  Filled 2021-05-10: qty 30

## 2021-05-10 NOTE — Progress Notes (Signed)
PHARMACIST - PHYSICIAN COMMUNICATION  CONCERNING: Antibiotic IV to Oral Route Change Policy  RECOMMENDATION: This patient is receiving metronidazole by the intravenous route.  Based on criteria approved by the Pharmacy and Therapeutics Committee, the antibiotic(s) is/are being converted to the equivalent oral dose form(s).   DESCRIPTION: These criteria include: Patient being treated for a respiratory tract infection, urinary tract infection, cellulitis or clostridium difficile associated diarrhea if on metronidazole The patient is not neutropenic and does not exhibit a GI malabsorption state The patient is eating (either orally or via tube) and/or has been taking other orally administered medications for a least 24 hours The patient is improving clinically and has a Tmax < 100.5  If you have questions about this conversion, please contact the [facility]  05/10/21

## 2021-05-10 NOTE — Progress Notes (Addendum)
CARDIOLOGY CONSULT NOTE               Patient ID: [name]. MRN: [id number] DOB/AGE: 67-26-1957 67 y.o.  Admit date: 05/08/2021 Referring Physician Dr Damita Dunnings Primary Physician Dr. Doy Hutching Primary Cardiologist Dr. Bartholome Bill  Reason for Consultation heart failure  HPI: pt is a [phone] with a PMH significant for CAD s/p CABG 2006 and PCI of RCA 01/2020, HFpEF (LVEF 60 to 65% 12/2020),HTN, BPH, DM, class III obesity, PAD s/p left third toe amputation, chronic venous stasis on Unna boots,  A. fib on apixaban, amiodarone and digoxin, sleep apnea, chronic ulcer left foot, , OSA on CPAP who presents to the emergency room for evaluation of worsening bilateral lower extremity edema associated with weeping of the extremities.  He was hospitalized 12/2020 with a similar presentation of lower extremity cellulitis and had strep bacteremia at that time. Cardiology was consulted for assistance with diuresis and his heart failure.  Interval history -echo resulted with LVEF 50-55%, moderate LVH without regional wall motion abnormalities or valvular pathologies.  -No acute events overnight, podiatry planning for debridement of lower extremity wounds today -Urine output 0.7 L reported, continuing IV Lasix 60 twice daily for now -Encourage patient to be compliant with telemetry monitoring, per nursing patient kept taking off leads and only wanted them to be placed posteriorly -Denies chest pain, shortness of breath, palpitations despite his heart rate remaining in the 110s  Review of systems complete and found to be negative unless listed above   Past Medical History:  Diagnosis Date   Asthma    Coronary artery disease    Depression    Diabetes mellitus without complication ([place])    Gout    History anabolic steroid use    Hyperlipidemia    Hypertension    Hypogonadism in male    Morbid obesity ([place])    Myocardial infarction ([place])    Peripheral vascular disease ([place])    Perirectal  abscess    Pleurisy    Sleep apnea    CPAP at night, no oxygen   Varicella     Past Surgical History:  Procedure Laterality Date   ABDOMINAL AORTIC ANEURYSM REPAIR     ACHILLES TENDON SURGERY Left 01/10/2021   Procedure: ACHILLES LENGTHENING/KIDNER;  Surgeon: Caroline More, DPM;  Location: [facility];  Service: Podiatry;  Laterality: Left;   AMPUTATION TOE Right 02/10/2016   Procedure: AMPUTATION TOE 3RD TOE;  Surgeon: Samara Deist, DPM;  Location: [facility];  Service: Podiatry;  Laterality: Right;   AMPUTATION TOE Left 02/24/2020   Procedure: AMPUTATION TOE;  Surgeon: Caroline More, DPM;  Location: [facility];  Service: Podiatry;  Laterality: Left;   APPLICATION OF WOUND VAC Left 02/29/2020   Procedure: APPLICATION OF WOUND VAC;  Surgeon: Caroline More, DPM;  Location: [facility];  Service: Podiatry;  Laterality: Left;   COLONOSCOPY WITH PROPOFOL [name] 11/18/2015   Procedure: COLONOSCOPY WITH PROPOFOL;  Surgeon: Manya Silvas, MD;  Location: [facility];  Service: Endoscopy;  Laterality: [name];   CORONARY ARTERY BYPASS GRAFT     CORONARY STENT INTERVENTION [name] 02/02/2020   Procedure: CORONARY STENT INTERVENTION;  Surgeon: Isaias Cowman, MD;  Location: [place] [facility];  Service: Cardiovascular;  Laterality: [name];   IRRIGATION AND DEBRIDEMENT FOOT Left 02/29/2020   Procedure: IRRIGATION AND DEBRIDEMENT FOOT;  Surgeon: Caroline More, DPM;  Location: [facility];  Service: Podiatry;  Laterality: Left;   IRRIGATION AND DEBRIDEMENT FOOT Left 02/24/2020   Procedure: IRRIGATION AND DEBRIDEMENT  FOOT;  Surgeon: Caroline More, DPM;  Location: [facility];  Service: Podiatry;  Laterality: Left;   KNEE ARTHROSCOPY     LEFT HEART CATH AND CORS/GRAFTS ANGIOGRAPHY [name] 02/02/2020   Procedure: LEFT HEART CATH AND CORS/GRAFTS ANGIOGRAPHY;  Surgeon: Teodoro Spray, MD;  Location: [place] [facility];  Service: Cardiovascular;  Laterality: [name];   LOWER EXTREMITY ANGIOGRAPHY Left 02/25/2020   Procedure: Lower  Extremity Angiography;  Surgeon: Algernon Huxley, MD;  Location: [place] [facility];  Service: Cardiovascular;  Laterality: Left;   LOWER EXTREMITY ANGIOGRAPHY Left 01/04/2021   Procedure: LOWER EXTREMITY ANGIOGRAPHY;  Surgeon: Algernon Huxley, MD;  Location: [place] [facility];  Service: Cardiovascular;  Laterality: Left;   METATARSAL HEAD EXCISION Left 01/10/2021   Procedure: METATARSAL HEAD EXCISION - LEFT 5th;  Surgeon: Caroline More, DPM;  Location: [facility];  Service: Podiatry;  Laterality: Left;   PERIPHERAL VASCULAR CATHETERIZATION Right 01/24/2016   Procedure: Lower Extremity Angiography;  Surgeon: Katha Cabal, MD;  Location: [place] [facility];  Service: Cardiovascular;  Laterality: Right;   PERIPHERAL VASCULAR CATHETERIZATION Right 01/25/2016   Procedure: Lower Extremity Angiography;  Surgeon: Katha Cabal, MD;  Location: [place] [facility];  Service: Cardiovascular;  Laterality: Right;   TOE AMPUTATION     TONSILLECTOMY      (Not in a hospital admission)  Social History   Socioeconomic History   Marital status: Married    Spouse name: Not on file   Number of children: Not on file   Years of education: Not on file   Highest education level: Not on file  Occupational History   Not on file  Tobacco Use   Smoking status: Former    Packs/day: 0.50    Years: 45.00    Pack years: 22.50    Types: Cigarettes    Quit date: 04/05/2015    Years since quitting: 6.1   Smokeless tobacco: Never  Vaping Use   Vaping Use: Every day  Substance and Sexual Activity   Alcohol use: Not Currently    Alcohol/week: 3.0 standard drinks    Types: 3 Glasses of wine per week   Drug use: No   Sexual activity: Not on file  Other Topics Concern   Not on file  Social History Narrative   Not on file   Social Determinants of Health   Financial Resource Strain: Not on file  Food Insecurity: Not on file  Transportation Needs: Not on file  Physical Activity: Not on file  Stress: Not  on file  Social Connections: Not on file  Intimate Partner Violence: Not on file    History reviewed. No pertinent family history.    Review of systems complete and found to be negative unless listed above   PHYSICAL EXAM General: Obese Caucasian male, well nourished, in no acute distress.  Examined sitting upright with feet off edge of bed HEENT:  Normocephalic and atraumatic. Neck:  No JVD.  Lungs: Normal respiratory effort on 2 L by nasal cannula. Clear bilaterally to auscultation. No wheezes, crackles, rhonchi.  Heart: Tachycardic irregularly irregular rhythm. Normal S1 and S2 without gallops or murmurs. Radial pulse 2+ bilaterally. Abdomen: Obese appearing.  Msk: Normal strength and tone for age. Extremities: Significant bilateral lower extremity edema and tightness with woody chronic venous stasis changes, 2 to 3 cm linear wound on right plantar foot. Medial right calf with linear open wound draining blood and clear fluid. Top of right great doe with open  ulcer. LEFT foot s/p third toe amputation. RIGHT foot s/p third and fourth toe amputation  Neuro: Alert and oriented x3 Psych: Mood appropriate for situation.  Labs:   Lab Results  Component Value Date   WBC 7.0 05/10/2021   HGB 11.3 (L) 05/10/2021   HCT 37.0 (L) 05/10/2021   MCV 93.7 05/10/2021   PLT 281 05/10/2021    Recent Labs  Lab 05/08/21 1154 05/09/21 0720 05/10/21 0656  NA 139   < > 138  K 5.1   < > 4.3  CL 97*   < > 100  CO2 32   < > 31  BUN 20   < > 23  CREATININE 1.02   < > 1.14  CALCIUM 8.2*   < > 8.2*  PROT 7.3  --   --   BILITOT 0.8  --   --   ALKPHOS 197*  --   --   ALT 31  --   --   AST 27  --   --   GLUCOSE 209*   < > 192*   < > = values in this interval not displayed.    No results found for: CKTOTAL, CKMB, CKMBINDEX, TROPONINI No results found for: CHOL No results found for: HDL No results found for: LDLCALC No results found for: TRIG No results found for: CHOLHDL No results found  for: LDLDIRECT    Radiology: DG Chest 2 View  Result Date: 05/08/2021 CLINICAL DATA:  Sepsis. EXAM: CHEST - 2 VIEW COMPARISON:  January 16, 2021. FINDINGS: Status post coronary bypass graft. Stable cardiomegaly with central pulmonary vascular congestion is noted. Mild bilateral pulmonary edema may be present. No consolidative process is noted. Bony thorax is unremarkable. IMPRESSION: Stable cardiomegaly with mild central pulmonary vascular congestion and probable mild bilateral pulmonary edema. Electronically Signed   By: Marijo Conception M.D.   On: 05/08/2021 12:31   US Venous Img Lower Bilateral  Result Date: 05/08/2021 CLINICAL DATA:  Pain and swelling EXAM: Bilateral LOWER EXTREMITY VENOUS DOPPLER ULTRASOUND TECHNIQUE: Gray-scale sonography with compression, as well as color and duplex ultrasound, were performed to evaluate the deep venous system(s) from the level of the common femoral vein through the popliteal and proximal calf veins. COMPARISON:  None. FINDINGS: VENOUS Normal compressibility of the common femoral, superficial femoral, and popliteal veins, as well as the visualized calf veins. Visualized portions of profunda femoral vein and great saphenous vein unremarkable. No filling defects to suggest DVT on grayscale or color Doppler imaging. Doppler waveforms show normal direction of venous flow, normal respiratory plasticity and response to augmentation. OTHER None. Limitations: Edema and habitus. IMPRESSION: Negative. Electronically Signed   By: Donavan Foil M.D.   On: 05/08/2021 21:19   DG Foot Complete Left  Result Date: 05/08/2021 CLINICAL DATA:  Bilateral foot wound EXAM: LEFT FOOT - COMPLETE 3+ VIEW COMPARISON:  01/10/2021 FINDINGS: No acute fracture is seen. Previous trans meta tarsal amputation third digit and partial amputation of fifth metatarsal. Cut margins appear smooth. No bony destructive change. No soft tissue emphysema. IMPRESSION: No acute osseous abnormality. Previous  transmetatarsal amputation third digit and partial amputation of fifth metatarsal. Electronically Signed   By: Donavan Foil M.D.   On: 05/08/2021 23:06   DG Foot Complete Right  Result Date: 05/08/2021 CLINICAL DATA:  Foot wound EXAM: RIGHT FOOT COMPLETE - 3+ VIEW COMPARISON:  None. FINDINGS: No fracture is seen. Prior transmetatarsal amputation second digit and previous amputation of third digit at the level of  the MTP joint. Smooth cut margins. Irregular arthropathy with erosive changes at the first IP joint of uncertain chronicity. There is soft tissue swelling. Negative for soft tissue emphysema. IMPRESSION: 1. Previous partial amputation of the second and third digits without acute findings here 2. Irregular arthropathy at the first IP joint with erosive changes of uncertain chronicity, correlate for signs of active infection here. Evaluation with MRI may be obtained as indicated. Electronically Signed   By: Donavan Foil M.D.   On: 05/08/2021 23:09   ECHOCARDIOGRAM COMPLETE  Result Date: 05/10/2021    ECHOCARDIOGRAM REPORT   Patient Name:   [name]. Date of Exam: 05/10/2021 Medical Rec #:  [id number]             Height:       76.0 in Accession #:    [id number]            Weight:       350.0 lb Date of Birth:  24-Apr-1956            BSA:          2.810 m Patient Age:    31 years              BP:           114/65 mmHg Patient Gender: M                     HR:           118 bpm. Exam Location:  [facility] Procedure: 2D Echo, Cardiac Doppler and Color Doppler Indications:     CHF I50.9  History:         Patient has prior history of Echocardiogram examinations, most                  recent 01/04/2021. Previous Myocardial Infarction; Risk                  Factors:Hypertension and Diabetes.  Sonographer:     [name] Sport Referring Phys:  [id number] [name] Ondria Oswald Diagnosing Phys: Donnelly Angelica  Sonographer Comments: Technically challenging study due to limited acoustic windows, suboptimal parasternal window,  no apical window and no subcostal window. IMPRESSIONS  1. Left ventricular ejection fraction, by estimation, is 50 to 55%. The left ventricle has low normal function. The left ventricle has no regional wall motion abnormalities. There is moderate left ventricular hypertrophy. Left ventricular diastolic function could not be evaluated.  2. Right ventricular systolic function was not well visualized. The right ventricular size is not well visualized.  3. The mitral valve is normal in structure. No evidence of mitral valve regurgitation.  4. The aortic valve is normal in structure. Aortic valve regurgitation is not visualized. Aortic valve sclerosis is present, with no evidence of aortic valve stenosis. FINDINGS  Left Ventricle: Left ventricular ejection fraction, by estimation, is 50 to 55%. The left ventricle has low normal function. The left ventricle has no regional wall motion abnormalities. The left ventricular internal cavity size was normal in size. There is moderate left ventricular hypertrophy. Left ventricular diastolic function could not be evaluated. Right Ventricle: The right ventricular size is not well visualized. Right vetricular wall thickness was not well visualized. Right ventricular systolic function was not well visualized. Left Atrium: Left atrial size was not well visualized. Right Atrium: Right atrial size was not well visualized. Pericardium: There is no evidence of pericardial effusion. Mitral Valve: The mitral valve is normal  in structure. Mild mitral annular calcification. No evidence of mitral valve regurgitation. Tricuspid Valve: The tricuspid valve is normal in structure. Tricuspid valve regurgitation is not demonstrated. Aortic Valve: The aortic valve is normal in structure. Aortic valve regurgitation is not visualized. Aortic valve sclerosis is present, with no evidence of aortic valve stenosis. Pulmonic Valve: The pulmonic valve was not well visualized. Pulmonic valve regurgitation is  not visualized. No evidence of pulmonic stenosis. Aorta: The aortic root is normal in size and structure. Venous: The inferior vena cava was not well visualized. IAS/Shunts: The interatrial septum was not well visualized.  LEFT VENTRICLE PLAX 2D LVIDd:         5.80 cm LVIDs:         3.90 cm LV PW:         1.20 cm LV IVS:        1.60 cm LVOT diam:     2.00 cm LVOT Area:     3.14 cm  LEFT ATRIUM         Index LA diam:    5.10 cm 1.81 cm/m                        PULMONIC VALVE AORTA                 PV Vmax:        0.48 m/s Ao Root diam: 3.20 cm PV Vmean:       28.700 cm/s                       PV VTI:         0.057 m                       PV Peak grad:   0.9 mmHg                       PV Mean grad:   0.0 mmHg                       RVOT Peak grad: 1 mmHg   SHUNTS Systemic Diam: 2.00 cm Pulmonic VTI:  0.077 m Donnelly Angelica Electronically signed by Donnelly Angelica Signature Date/Time: 05/10/2021/9:11:30 AM    Final     ECHO LVEF 60 to 65% 12/2020  TELEMETRY reviewed by me: atrial flutter with 2:1 conduction rate 119  EKG reviewed by me: atrial flutter with 2:1 conduction 123  ASSESSMENT AND PLAN:  65yoM with a PMH significant for CAD s/p CABG 2006 and PCI of RCA 01/2020, HFpEF (LVEF 60 to 65% 12/2020), HTN, BPH, diabetes type 2, class III obesity, PAD s/p left third toe amputation, chronic venous stasis on Unna boots,  A. fib on apixaban, amiodarone and digoxin, sleep apnea, chronic ulcer left foot, OSA on CPAP who presents to the emergency room for evaluation of worsening bilateral lower extremity edema associated with weeping of the extremities.  Cardiology was consulted for assistance with diuresis and his heart failure.  #Acute on chronic HFpEF (LVEF 50-55% 05/10/2020) BNP elevated to 531, increased from hospitalization in September 197 Chest x-ray revealed mild central pulmonary vascular congestion and probable mild bilateral pulmonary edema. -Continue Lasix 60 mg twice daily doses to assist with diuresis. Will  continue to monitor output and titrate lasix as needed.  -Echocardiogram performed and formal read is pending. -Encouraged fluid restrictions to 64 ounces per  day, monitoring salt intake.   -Monitor I's and O's while inpatient.  #PAD s/p left third toe amputation #Chronic lower extremity wounds associated with venous stasis -Appreciate vascular surgery and podiatry recommendations -Agree with supportive care as per primary team -plan as below  #Paroxysmal Atrial fibrillation on eliquis Rhythm yesterday was atrial flutter with 2:1 conduction, pt not currently hooked up to telemetry during interview but remains tachycardiac on physical exam. CHA2DS2-VASc 5. Continue IV amio for now. Goals to transition to PO amiodarone 200mg  BID later in hospital course as his rate is more controlled.  Continue digoxin 0.0625 mg once daily, Cardizem CD 300 mg Eliquis on hold, continue heparin today as podiatry is performing a wound debridement. Will likely start back eliquis tomorrow.   #CAD s/p CABG 2006 and PCI of RCA 01/2020 #HTN #Hyperlipidemia -Continue Imdur -Continue Zetia -Would recommend high intensity statin therapy however patient has reported allergy of myalgias to statins -Plan as above  #type 2 diabetes Poorly controlled. A1c on admission 9.2, mgmt per primary team   This patient's plan of care was discussed and created with Dr. Donnelly Angelica and he is in agreement.   Signed: Tristan Schroeder , PA-C 05/10/2021, 9:13 AM

## 2021-05-10 NOTE — ED Notes (Signed)
This RN informed unit secretary Nitchia of new dressing request from wound care/ortho to be ordered

## 2021-05-10 NOTE — Progress Notes (Addendum)
PROGRESS NOTE    [name].  VFI:[id number] DOB: 1955-12-22 DOA: 05/08/2021 PCP: [name], MD     Brief Narrative:  Alec Less. is a 65 y.o. male with medical history significant for CAD s/p CABG 2006 and PCI of RCA 12/2020 and PCI, HTN, BPH, DM, class III obesity, PAD, A. fib on apixaban, amiodarone and digoxin, sleep apnea, chronic ulcer left foot, PAD s/p left first ray amputation, chronic venous stasis on Unna boots, chronic ulcer left foot, diastolic CHF last EF 60 to 65% 12/2020, OSA on CPAP who presents to the emergency room for evaluation of worsening bilateral lower extremity edema associated with weeping of the extremities as well as dyspnea on exertion.  Patient was hospitalized in September 2022 for cellulitis of chronic left foot ulcer and found to have streptococcal bacteremia at that time.  LLE angiogram showed no significant stenosis.  He is followed both by vascular surgery and podiatry.  New events last 24 hours / Subjective: Patient seen in the emergency department, no new complaints. Continues to have bilateral LE edema   Assessment & Plan:   Principal Problem:   Anasarca Active Problems:   Essential hypertension, benign   PAD (peripheral artery disease) (HCC)   Bilateral cellulitis of lower leg   CAD (coronary artery disease), autologous vein bypass graft   Hypersomnia with sleep apnea   Diabetes mellitus without complication (HCC)   Obesity, Class III, BMI 40-49.9 (morbid obesity) (HCC)   BPH (benign prostatic hyperplasia)   Chronic anticoagulation   Acute on chronic diastolic CHF (congestive heart failure) (HCC)   Foot ulcer with fat layer exposed, left (HCC)   Venous stasis of both lower extremities   AF (paroxysmal atrial fibrillation) (HCC)   CHF exacerbation (HCC)   Bilateral lower extremity cellulitis, venous stasis ulcers, PAD -Appreciate vascular surgery and podiatry -Vascular surgery holding off on angiogram at this  time -Remains on vancomycin, cefepime, Flagyl -Eliquis on hold in anticipation for debridement  Staph bacteremia -This is likely to be a contaminant as only seen 1 of 4 blood culture bottles -On antibiotics as above regardless  Acute on chronic diastolic HF  -Repeat echocardiogram showed EF 50 to 55% -Cardiology following -Continue IV Lasix  A. fib with RVR -Continue amiodarone, diltiazem, digoxin -Eliquis on hold pending debridement, remains on IV heparin  Diabetes mellitus type 2, uncontrolled with hyperglycemia -A1c 9.2 -Continue Semglee, sliding scale insulin  CAD status post CABG -Continue aspirin, atorvastatin, Imdur  RLS -Continue pramipexole, primidone  BPH -Continue Flomax  OSA -CPAP nightly  DVT prophylaxis: IV Heparin    Code Status: Full code  Family Communication: None at bedside  Disposition Plan:  Status is: Inpatient  Remains inpatient appropriate because: IV antibiotics, IV  heparin       Consultants:  Cardiology Podiatry Vascular surgery   Procedures:  None   Antimicrobials:  Anti-infectives (From admission, onward)    Start     Dose/Rate Route Frequency Ordered Stop   05/10/21 2200  metroNIDAZOLE (FLAGYL) tablet 500 mg        500 mg Oral Every 12 hours 05/10/21 1238     05/09/21 1400  vancomycin (VANCOREADY) IVPB 1500 mg/300 mL        1,500 mg 150 mL/hr over 120 Minutes Intravenous Every 12 hours 05/09/21 0106 05/16/21 0159   05/09/21 1300  vancomycin (VANCOREADY) IVPB 1500 mg/300 mL  Status:  Discontinued        1,500 mg 150 mL/hr over  120 Minutes Intravenous Every 12 hours 05/09/21 0104 05/09/21 0106   05/09/21 0830  metroNIDAZOLE (FLAGYL) IVPB 500 mg  Status:  Discontinued        500 mg 100 mL/hr over 60 Minutes Intravenous Every 12 hours 05/09/21 0828 05/10/21 1238   05/09/21 0600  ceFEPIme (MAXIPIME) 2 g in sodium chloride 0.9 % 100 mL IVPB  Status:  Discontinued        2 g 200 mL/hr over 30 Minutes Intravenous Every 8  hours 05/09/21 0050 05/09/21 0105   05/09/21 0600  ceFEPIme (MAXIPIME) 2 g in sodium chloride 0.9 % 100 mL IVPB        2 g 200 mL/hr over 30 Minutes Intravenous Every 8 hours 05/09/21 0105 05/16/21 0559   05/09/21 0000  vancomycin (VANCOCIN) IVPB 1000 mg/200 mL premix       See Hyperspace for full Linked Orders Report.   1,000 mg 200 mL/hr over 60 Minutes Intravenous  Once 05/08/21 2303 05/09/21 0625   05/09/21 0000  vancomycin (VANCOREADY) IVPB 1500 mg/300 mL       See Hyperspace for full Linked Orders Report.   1,500 mg 150 mL/hr over 120 Minutes Intravenous  Once 05/08/21 2303 05/09/21 0257   05/08/21 2315  ceFEPIme (MAXIPIME) 2 g in sodium chloride 0.9 % 100 mL IVPB        2 g 200 mL/hr over 30 Minutes Intravenous  Once 05/08/21 2303 05/09/21 0030        Objective: Vitals:   05/10/21 0942 05/10/21 0945 05/10/21 1130 05/10/21 1200  BP: (!) 158/88 (!) 152/88 113/80 121/86  Pulse: (!) 118 (!) 117 (!) 119 (!) 117  Resp:  19 16 (!) 22  Temp:      TempSrc:      SpO2:  99% 93% 95%  Weight:      Height:        Intake/Output Summary (Last 24 hours) at 05/10/2021 1324 Last data filed at 05/10/2021 0254 Gross per 24 hour  Intake 680.9 ml  Output 1650 ml  Net -969.1 ml   Filed Weights   05/08/21 1140  Weight: (!) 158.8 kg    Examination:  General exam: Appears calm and comfortable  Respiratory system: Clear to auscultation. Respiratory effort normal. No respiratory distress. No conversational dyspnea.  Cardiovascular system: S1 & S2 heard, tachycardic. No murmurs. + Bilateral pedal edema. Gastrointestinal system: Abdomen is nondistended, soft and nontender. Normal bowel sounds heard. Central nervous system: Alert and oriented. No focal neurological deficits. Speech clear.  Skin:     Psychiatry: Judgement and insight appear normal. Mood & affect appropriate.   Data Reviewed: I have personally reviewed following labs and imaging studies  CBC: Recent Labs  Lab  05/08/21 1154 05/10/21 0656  WBC 8.8 7.0  NEUTROABS 6.6  --   HGB 11.7* 11.3*  HCT 39.2 37.0*  MCV 95.6 93.7  PLT 289 323   Basic Metabolic Panel: Recent Labs  Lab 05/08/21 1154 05/09/21 0720 05/10/21 0656  NA 139 136 138  K 5.1 4.3 4.3  CL 97* 96* 100  CO2 32 32 31  GLUCOSE 209* 178* 192*  BUN 20 20 23   CREATININE 1.02 1.15 1.14  CALCIUM 8.2* 8.4* 8.2*   GFR: Estimated Creatinine Clearance: 104.2 mL/min (by C-G formula based on SCr of 1.14 mg/dL). Liver Function Tests: Recent Labs  Lab 05/08/21 1154  AST 27  ALT 31  ALKPHOS 197*  BILITOT 0.8  PROT 7.3  ALBUMIN 2.9*   No  results for input(s): LIPASE, AMYLASE in the last 168 hours. No results for input(s): AMMONIA in the last 168 hours. Coagulation Profile: Recent Labs  Lab 05/08/21 1154  INR 1.2   Cardiac Enzymes: No results for input(s): CKTOTAL, CKMB, CKMBINDEX, TROPONINI in the last 168 hours. BNP (last 3 results) No results for input(s): PROBNP in the last 8760 hours. HbA1C: Recent Labs    05/09/21 0720  HGBA1C 9.2*   CBG: Recent Labs  Lab 05/09/21 1129 05/09/21 1626 05/09/21 2320 05/10/21 0715 05/10/21 1143  GLUCAP 192* 269* 340* 192* 199*   Lipid Profile: No results for input(s): CHOL, HDL, LDLCALC, TRIG, CHOLHDL, LDLDIRECT in the last 72 hours. Thyroid Function Tests: No results for input(s): TSH, T4TOTAL, FREET4, T3FREE, THYROIDAB in the last 72 hours. Anemia Panel: No results for input(s): VITAMINB12, FOLATE, FERRITIN, TIBC, IRON, RETICCTPCT in the last 72 hours. Sepsis Labs: Recent Labs  Lab 05/08/21 1154 05/08/21 1258  LATICACIDVEN 2.3* 1.5    Recent Results (from the past 240 hour(s))  Culture, blood (Routine x 2)     Status: None (Preliminary result)   Collection Time: 05/08/21 11:54 AM   Specimen: BLOOD  Result Value Ref Range Status   Specimen Description BLOOD LEFT ANTECUBITAL  Final   Special Requests   Final    BOTTLES DRAWN AEROBIC AND ANAEROBIC Blood Culture  adequate volume   Culture   Final    NO GROWTH 2 DAYS Performed at [facility], [address], [place], [place] [address]    Report Status PENDING  Incomplete  Culture, blood (Routine x 2)     Status: Abnormal (Preliminary result)   Collection Time: 05/08/21 12:56 PM   Specimen: BLOOD  Result Value Ref Range Status   Specimen Description   Final    BLOOD BLOOD LEFT FOREARM Performed at [facility], [address] [place], [place] [address]    Special Requests   Final    BOTTLES DRAWN AEROBIC AND ANAEROBIC Blood Culture adequate volume Performed at [facility], [address] [place], [place] [address]    Culture  Setup Time   Final    GRAM POSITIVE COCCI Organism ID to follow AEROBIC BOTTLE ONLY CRITICAL RESULT CALLED TO, READ BACK BY AND VERIFIED WITH: LISA KOUTZZ 05/09/21 0706 Performed at [facility], [address] [place], [place] [address]    Culture (A)  Final    STAPHYLOCOCCUS HOMINIS THE SIGNIFICANCE OF ISOLATING THIS ORGANISM FROM A SINGLE SET OF BLOOD CULTURES WHEN MULTIPLE SETS ARE DRAWN IS UNCERTAIN. PLEASE NOTIFY THE MICROBIOLOGY DEPARTMENT WITHIN ONE WEEK IF SPECIATION AND SENSITIVITIES ARE REQUIRED. Performed at [facility], [place] [address] [place], [place] [address]    Report Status PENDING  Incomplete  Blood Culture ID Panel (Reflexed)     Status: Abnormal   Collection Time: 05/08/21 12:56 PM  Result Value Ref Range Status   Enterococcus faecalis NOT DETECTED NOT DETECTED Final   Enterococcus Faecium NOT DETECTED NOT DETECTED Final   Listeria monocytogenes NOT DETECTED NOT DETECTED Final   Staphylococcus species DETECTED (A) NOT DETECTED Final    Comment: CRITICAL RESULT CALLED TO, READ BACK BY AND VERIFIED WITH: LISA KOTZZ 05/09/21 0707 MW    Staphylococcus aureus (BCID) NOT DETECTED NOT DETECTED Final   Staphylococcus epidermidis NOT DETECTED NOT DETECTED Final   Staphylococcus lugdunensis NOT DETECTED  NOT DETECTED Final   Streptococcus species NOT DETECTED NOT DETECTED Final   Streptococcus agalactiae NOT DETECTED NOT DETECTED Final  Streptococcus pneumoniae NOT DETECTED NOT DETECTED Final   Streptococcus pyogenes NOT DETECTED NOT DETECTED Final   A.calcoaceticus-baumannii NOT DETECTED NOT DETECTED Final   Bacteroides fragilis NOT DETECTED NOT DETECTED Final   Enterobacterales NOT DETECTED NOT DETECTED Final   Enterobacter cloacae complex NOT DETECTED NOT DETECTED Final   Escherichia coli NOT DETECTED NOT DETECTED Final   Klebsiella aerogenes NOT DETECTED NOT DETECTED Final   Klebsiella oxytoca NOT DETECTED NOT DETECTED Final   Klebsiella pneumoniae NOT DETECTED NOT DETECTED Final   Proteus species NOT DETECTED NOT DETECTED Final   Salmonella species NOT DETECTED NOT DETECTED Final   Serratia marcescens NOT DETECTED NOT DETECTED Final   Haemophilus influenzae NOT DETECTED NOT DETECTED Final   Neisseria meningitidis NOT DETECTED NOT DETECTED Final   Pseudomonas aeruginosa NOT DETECTED NOT DETECTED Final   Stenotrophomonas maltophilia NOT DETECTED NOT DETECTED Final   Candida albicans NOT DETECTED NOT DETECTED Final   Candida auris NOT DETECTED NOT DETECTED Final   Candida glabrata NOT DETECTED NOT DETECTED Final   Candida krusei NOT DETECTED NOT DETECTED Final   Candida parapsilosis NOT DETECTED NOT DETECTED Final   Candida tropicalis NOT DETECTED NOT DETECTED Final   Cryptococcus neoformans/gattii NOT DETECTED NOT DETECTED Final    Comment: Performed at [facility], [address] [place], [place] [address]  Resp Panel by RT-PCR (Flu A&B, Covid) Nasopharyngeal Swab     Status: None   Collection Time: 05/09/21  9:50 AM   Specimen: Nasopharyngeal Swab; Nasopharyngeal(NP) swabs in vial transport medium  Result Value Ref Range Status   SARS Coronavirus 2 by RT PCR NEGATIVE NEGATIVE Final    Comment: (NOTE) SARS-CoV-2 target nucleic acids are NOT DETECTED.  The  SARS-CoV-2 RNA is generally detectable in upper respiratory specimens during the acute phase of infection. The lowest concentration of SARS-CoV-2 viral copies this assay can detect is 138 copies/mL. A negative result does not preclude SARS-Cov-2 infection and should not be used as the sole basis for treatment or other patient management decisions. A negative result may occur with  improper specimen collection/handling, submission of specimen other than nasopharyngeal swab, presence of viral mutation(s) within the areas targeted by this assay, and inadequate number of viral copies(<138 copies/mL). A negative result must be combined with clinical observations, patient history, and epidemiological information. The expected result is Negative.  Fact Sheet for Patients:  EntrepreneurPulse.com.au  Fact Sheet for Healthcare Providers:  IncredibleEmployment.be  This test is no t yet approved or cleared by the [place] FDA and  has been authorized for detection and/or diagnosis of SARS-CoV-2 by FDA under an Emergency Use Authorization (EUA). This EUA will remain  in effect (meaning this test can be used) for the duration of the COVID-19 declaration under Section 564(b)(1) of the Act, 21 U.S.C.section 360bbb-3(b)(1), unless the authorization is terminated  or revoked sooner.       Influenza A by PCR NEGATIVE NEGATIVE Final   Influenza B by PCR NEGATIVE NEGATIVE Final    Comment: (NOTE) The Xpert Xpress SARS-CoV-2/FLU/RSV plus assay is intended as an aid in the diagnosis of influenza from Nasopharyngeal swab specimens and should not be used as a sole basis for treatment. Nasal washings and aspirates are unacceptable for Xpert Xpress SARS-CoV-2/FLU/RSV testing.  Fact Sheet for Patients: EntrepreneurPulse.com.au  Fact Sheet for Healthcare Providers: IncredibleEmployment.be  This test is not yet approved or  cleared by the [place] FDA and has been authorized for detection and/or diagnosis of SARS-CoV-2 by FDA under  an Emergency Use Authorization (EUA). This EUA will remain in effect (meaning this test can be used) for the duration of the COVID-19 declaration under Section 564(b)(1) of the Act, 21 U.S.C. section 360bbb-3(b)(1), unless the authorization is terminated or revoked.  Performed at [facility], [address], [place], [place] [address]       Radiology Studies: US Venous Img Lower Bilateral  Result Date: 05/08/2021 CLINICAL DATA:  Pain and swelling EXAM: Bilateral LOWER EXTREMITY VENOUS DOPPLER ULTRASOUND TECHNIQUE: Gray-scale sonography with compression, as well as color and duplex ultrasound, were performed to evaluate the deep venous system(s) from the level of the common femoral vein through the popliteal and proximal calf veins. COMPARISON:  None. FINDINGS: VENOUS Normal compressibility of the common femoral, superficial femoral, and popliteal veins, as well as the visualized calf veins. Visualized portions of profunda femoral vein and great saphenous vein unremarkable. No filling defects to suggest DVT on grayscale or color Doppler imaging. Doppler waveforms show normal direction of venous flow, normal respiratory plasticity and response to augmentation. OTHER None. Limitations: Edema and habitus. IMPRESSION: Negative. Electronically Signed   By: Donavan Foil M.D.   On: 05/08/2021 21:19   DG Foot Complete Left  Result Date: 05/08/2021 CLINICAL DATA:  Bilateral foot wound EXAM: LEFT FOOT - COMPLETE 3+ VIEW COMPARISON:  01/10/2021 FINDINGS: No acute fracture is seen. Previous trans meta tarsal amputation third digit and partial amputation of fifth metatarsal. Cut margins appear smooth. No bony destructive change. No soft tissue emphysema. IMPRESSION: No acute osseous abnormality. Previous transmetatarsal amputation third digit and partial amputation of fifth metatarsal.  Electronically Signed   By: Donavan Foil M.D.   On: 05/08/2021 23:06   DG Foot Complete Right  Result Date: 05/08/2021 CLINICAL DATA:  Foot wound EXAM: RIGHT FOOT COMPLETE - 3+ VIEW COMPARISON:  None. FINDINGS: No fracture is seen. Prior transmetatarsal amputation second digit and previous amputation of third digit at the level of the MTP joint. Smooth cut margins. Irregular arthropathy with erosive changes at the first IP joint of uncertain chronicity. There is soft tissue swelling. Negative for soft tissue emphysema. IMPRESSION: 1. Previous partial amputation of the second and third digits without acute findings here 2. Irregular arthropathy at the first IP joint with erosive changes of uncertain chronicity, correlate for signs of active infection here. Evaluation with MRI may be obtained as indicated. Electronically Signed   By: Donavan Foil M.D.   On: 05/08/2021 23:09   ECHOCARDIOGRAM COMPLETE  Result Date: 05/10/2021    ECHOCARDIOGRAM REPORT   Patient Name:   Alec Norfleet. Date of Exam: 05/10/2021 Medical Rec #:  [id number]             Height:       76.0 in Accession #:    [id number]            Weight:       350.0 lb Date of Birth:  11-30-1955            BSA:          2.810 m Patient Age:    20 years              BP:           114/65 mmHg Patient Gender: M                     HR:           118 bpm. Exam Location:  [facility] Procedure: 2D Echo, Cardiac Doppler and Color Doppler Indications:     CHF I50.9  History:         Patient has prior history of Echocardiogram examinations, most                  recent 01/04/2021. Previous Myocardial Infarction; Risk                  Factors:Hypertension and Diabetes.  Sonographer:     [name] Sport Referring Phys:  [id number] [place] TANG Diagnosing Phys: Donnelly Angelica  Sonographer Comments: Technically challenging study due to limited acoustic windows, suboptimal parasternal window, no apical window and no subcostal window. IMPRESSIONS  1. Left ventricular ejection  fraction, by estimation, is 50 to 55%. The left ventricle has low normal function. The left ventricle has no regional wall motion abnormalities. There is moderate left ventricular hypertrophy. Left ventricular diastolic function could not be evaluated.  2. Right ventricular systolic function was not well visualized. The right ventricular size is not well visualized.  3. The mitral valve is normal in structure. No evidence of mitral valve regurgitation.  4. The aortic valve is normal in structure. Aortic valve regurgitation is not visualized. Aortic valve sclerosis is present, with no evidence of aortic valve stenosis. FINDINGS  Left Ventricle: Left ventricular ejection fraction, by estimation, is 50 to 55%. The left ventricle has low normal function. The left ventricle has no regional wall motion abnormalities. The left ventricular internal cavity size was normal in size. There is moderate left ventricular hypertrophy. Left ventricular diastolic function could not be evaluated. Right Ventricle: The right ventricular size is not well visualized. Right vetricular wall thickness was not well visualized. Right ventricular systolic function was not well visualized. Left Atrium: Left atrial size was not well visualized. Right Atrium: Right atrial size was not well visualized. Pericardium: There is no evidence of pericardial effusion. Mitral Valve: The mitral valve is normal in structure. Mild mitral annular calcification. No evidence of mitral valve regurgitation. Tricuspid Valve: The tricuspid valve is normal in structure. Tricuspid valve regurgitation is not demonstrated. Aortic Valve: The aortic valve is normal in structure. Aortic valve regurgitation is not visualized. Aortic valve sclerosis is present, with no evidence of aortic valve stenosis. Pulmonic Valve: The pulmonic valve was not well visualized. Pulmonic valve regurgitation is not visualized. No evidence of pulmonic stenosis. Aorta: The aortic root is normal  in size and structure. Venous: The inferior vena cava was not well visualized. IAS/Shunts: The interatrial septum was not well visualized.  LEFT VENTRICLE PLAX 2D LVIDd:         5.80 cm LVIDs:         3.90 cm LV PW:         1.20 cm LV IVS:        1.60 cm LVOT diam:     2.00 cm LVOT Area:     3.14 cm  LEFT ATRIUM         Index LA diam:    5.10 cm 1.81 cm/m                        PULMONIC VALVE AORTA                 PV Vmax:        0.48 m/s Ao Root diam: 3.20 cm PV Vmean:       28.700 cm/s  PV VTI:         0.057 m                       PV Peak grad:   0.9 mmHg                       PV Mean grad:   0.0 mmHg                       RVOT Peak grad: 1 mmHg   SHUNTS Systemic Diam: 2.00 cm Pulmonic VTI:  0.077 m Donnelly Angelica Electronically signed by Donnelly Angelica Signature Date/Time: 05/10/2021/9:11:30 AM    Final       Scheduled Meds:  aspirin EC  81 mg Oral Daily   atorvastatin  40 mg Oral Daily   collagenase   Topical Daily   digoxin  0.0625 mg Oral Daily   diltiazem  300 mg Oral Daily   ezetimibe  10 mg Oral Daily   furosemide  60 mg Intravenous BID   gabapentin  300 mg Oral Daily   insulin aspart  0-20 Units Subcutaneous TID WC   insulin aspart  0-5 Units Subcutaneous QHS   insulin glargine-yfgn  50 Units Subcutaneous BID   isosorbide mononitrate  60 mg Oral BID   metroNIDAZOLE  500 mg Oral Q12H   pramipexole  2 mg Oral QHS   primidone  250 mg Oral BID   sodium chloride flush  3 mL Intravenous Q12H   tamsulosin  0.4 mg Oral Daily   Continuous Infusions:  sodium chloride     amiodarone Stopped (05/10/21 0254)   ceFEPime (MAXIPIME) IV Stopped (05/10/21 0749)   heparin 2,350 Units/hr (05/10/21 0941)   vancomycin Stopped (05/10/21 0721)     LOS: 2 days      Time spent: 35 minutes   Dessa Phi, DO [facility] 05/10/2021, 1:24 PM   Available via Epic secure chat 7am-7pm After these hours, please refer to coverage provider listed on [email or web address]

## 2021-05-10 NOTE — Consult Note (Signed)
ANTICOAGULATION CONSULT NOTE  Pharmacy Consult for heparin Indication: atrial fibrillation  Allergies  Allergen Reactions   Statins     Other reaction(s): Muscle Pain Causes legs to ache per pt    Patient Measurements: Height: 6\' 4"  (193 cm) Weight: (!) 158.8 kg (350 lb) IBW/kg (Calculated) : 86.8 Heparin Dosing Weight: 123.6 kg  Vital Signs: BP: 114/65 (01/11 0600) Pulse Rate: 118 (01/11 0600)  Labs: Recent Labs    05/08/21 1154 05/09/21 0720 05/09/21 1420 05/09/21 2339 05/10/21 0656  HGB 11.7*  --   --   --  11.3*  HCT 39.2  --   --   --  37.0*  PLT 289  --   --   --  281  APTT  --   --  34 42* 49*  LABPROT 14.8  --   --   --   --   INR 1.2  --   --   --   --   HEPARINUNFRC  --   --   --   --  0.34  CREATININE 1.02 1.15  --   --  1.14     Estimated Creatinine Clearance: 104.2 mL/min (by C-G formula based on SCr of 1.14 mg/dL).   Medications:  PTA: Eliquis 5 mg BID, last dose 05/08/21 am Inpatient: Pt received lovenox 80 mg on 1/10 at 0807 for DVT prophylaxis  Assessment: 66 y.o. male with medical history significant for CAD, HTN, BPH, DM, PAD, chronic ulcer left foot, A. fib on apixaban who presents to the ED for evaluation for worsening bilateral lower extremity edema. Pharmacy has been consulted for heparin dosing for afib.  Baseline labs: Hgb 11.7, plts 289, INR 1.2, aPTT ordered   Goal of Therapy:  Heparin level 0.3-0.7 units/ml aPTT 66-102 seconds Monitor platelets by anticoagulation protocol: Yes  1/10 2339 aPTT 42, subtherapeutic 1/11 0656 aPTT 49, subtherapeutic   Plan:  Give 3700 units bolus x 1 Increase heparin infusion to 2350 units/hr Recheck aPTT level in 6 hours after rate change. Continue to monitor aPTT levels until aPTT and daily heparin level correlate  Continue to monitor H&H and platelets  Pearla Dubonnet, PharmD Clinical Pharmacist 05/10/2021 8:35 AM

## 2021-05-10 NOTE — Progress Notes (Addendum)
PT Cancellation Note  Patient Details Name: [name]. MRN: [id number] DOB: 07-Aug-1955   Cancelled Treatment:    Reason Eval/Treat Not Completed: Other (comment) Per conversation with nursing medical team still working on plan for proper combination of LE wrapping and surgical shoe type.  Addendum: MD also requested PT to hold this date secondary to resting HR in the 120s. Will attempt to see pt at a future date/time as medically appropriate once plan is in place and surgical shoes are obtained.    Linus Salmons PT, DPT 05/10/21, 3:00 PM

## 2021-05-10 NOTE — Progress Notes (Signed)
*  PRELIMINARY RESULTS* Echocardiogram 2D Echocardiogram has been performed.  [name] Sport 05/10/2021, 8:31 AM

## 2021-05-10 NOTE — Consult Note (Signed)
[facility] Nurse Consult Note: Reason for Consult: requested on 05/09/21 to place bilateral Unna's boots after podietry performs bedside debridement on foot wounds 05/11/21. Patient non compliant with compression therapy, stockings at home per wife.    Wound type: Neuropathic and venous stasis   Pressure Injury POA: NA  Measurement: see podiatry notes  Wound bed: reviewed images and wound are clean and pink s/p debridement; left heel; right great toe and left second toe wounds, all 100% clean and pale; areas of cracking related to lymphedema and venous stasis posterior RLE and lateral LLE  Drainage (amount, consistency, odor) bloody, nothing appears purulent today  Periwound:edema and venous dermatitis   Dressing procedure/placement/frequency: Arrived to place Unna's boots bilaterally just after Dr. Luana Shu has left the department.  Proceeded with application of Unna's boots bilaterally. Patient tolerated without difficulty.  Requested bedside nurse to apply silicone foam dressings to the right great toe and the left second toe, however she is not sure they will have them available.   When I returned to computer to document, Dr. Luana Shu has placed wound care orders for enzymatic debridement ointment to the left heel, silver hydrofiber to the weeping areas of the bilateral LEs. Dry gauze dressings to the toe wounds.  Discussed via secure chat with Dr. Luana Shu, today it has been a challenge to obtain the supplies needed for the Unna's boots (none in stock in the materials department) obtained from the [facility].  ED does not stock other items needed for wound care and the staff are limited on the abilities to obtain the supplies. For that reason the WOC has opted to try to obtain the supplies for the next change. Dr. Luana Shu aware we will change Unna's boots tomorrow.  Santyl has been ordered by the MD; this should arrive from the pharmacy I have requested ED staff obtain silver hydrofiber (I have provided Kellie Simmering #) and  requested 3 additional rolls of Coban to be placed in the patient's room I will notify my teammate that will cover the [facility] tomorrow to search for Unna's boots on the nursing floors prior to seeing this patient since materials does not have in stock I have added Eucerin cream for the venous stasis dermatitis after secure chat with podiatrist.  Orders for Unna's boots to be change 2x wk, requested this be Mondays/Thursdays beginning tomorrow.  Discussed in detail with patient and his wife who is quite involved in the patient's care that compliance with compression stockings or wraps at home is so very important, when patients do not wear stockings and the legs swell then the stockings do not fit and the legs end up with ulcerations.  Discussed with wife need for therapeutic compression and that having him measured in a medical supply store is best.  After therapy with Unna's boots when legs have decreased in size is the ideal time to take him for measurements.  We discussed use of Juxta light compression because of the challenges with application of the traditional full foot and leg stockings.  She would need to have patient measured for these.  On in the morning before arising from bed and off at bedtime once in the bed.   Will need follow up or [facility] for compression therapy either outpatient MD office or wound care center of patient's choice.   [place] nurse will plan to see tomorrow for change and addition of topical therapy.    Thanks  Dakarai Mcglocklin [employer], RN,CWOCN, CNS, [place] [phone])

## 2021-05-10 NOTE — Consult Note (Signed)
PODIATRY / FOOT AND ANKLE SURGERY CONSULTATION NOTE  Requesting Physician: Judd Gaudier, MD  Reason for consult: Venous/lymphedema wounds bilateral lower extremities, left heel wound, right hallux wound, left second toe wound, cellulitis bilateral lower extremities  Chief Complaint: Wounds and swelling to both feet   HPI: Alec Colter. is a 66 y.o. male who presents with multiple wounds to bilateral lower extremities including left heel, left second toe dorsal, right hallux dorsal, bilateral lower extremity venous multiple wounds/lymphedema wounds, cellulitis to bilateral lower extremities.  Patient came to the hospital due to increased leg swelling, wounds, shortness of breath.  Over the past couple of weeks he is noted that is gotten worse.  Patient was seen previously for wounds to both feet in clinic which had subsequently healed.  Patient was advised to follow-up in 1 month but patient never showed up for any further follow-up.  Patient subsequently gained several pounds since his last visit.  Patient recently noticed the left heel wound, patient presents also with significant other today who notes that the heel wound also was just noticed but she is unsure how long its been there for.  Patient presents today resting in bed fairly comfortably but appears to be very tired and is falling asleep in conversation.  PMHx:  Past Medical History:  Diagnosis Date   Asthma    Coronary artery disease    Depression    Diabetes mellitus without complication ([place])    Gout    History anabolic steroid use    Hyperlipidemia    Hypertension    Hypogonadism in male    Morbid obesity ([place])    Myocardial infarction ([place])    Peripheral vascular disease ([place])    Perirectal abscess    Pleurisy    Sleep apnea    CPAP at night, no oxygen   Varicella     Surgical Hx:  Past Surgical History:  Procedure Laterality Date   ABDOMINAL AORTIC ANEURYSM REPAIR     ACHILLES TENDON SURGERY Left 01/10/2021    Procedure: ACHILLES LENGTHENING/KIDNER;  Surgeon: Caroline More, DPM;  Location: [facility];  Service: Podiatry;  Laterality: Left;   AMPUTATION TOE Right 02/10/2016   Procedure: AMPUTATION TOE 3RD TOE;  Surgeon: Samara Deist, DPM;  Location: [facility];  Service: Podiatry;  Laterality: Right;   AMPUTATION TOE Left 02/24/2020   Procedure: AMPUTATION TOE;  Surgeon: Caroline More, DPM;  Location: [facility];  Service: Podiatry;  Laterality: Left;   APPLICATION OF WOUND VAC Left 02/29/2020   Procedure: APPLICATION OF WOUND VAC;  Surgeon: Caroline More, DPM;  Location: [facility];  Service: Podiatry;  Laterality: Left;   COLONOSCOPY WITH PROPOFOL [name] 11/18/2015   Procedure: COLONOSCOPY WITH PROPOFOL;  Surgeon: Manya Silvas, MD;  Location: [facility] END[facility];  Service: Endoscopy;  Laterality: [name];   CORONARY ARTERY BYPASS GRAFT     CORONARY STENT INTERVENTION [name] 02/02/2020   Procedure: CORONARY STENT INTERVENTION;  Surgeon: Isaias Cowman, MD;  Location: [place] [facility];  Service: Cardiovascular;  Laterality: [name];   IRRIGATION AND DEBRIDEMENT FOOT Left 02/29/2020   Procedure: IRRIGATION AND DEBRIDEMENT FOOT;  Surgeon: Caroline More, DPM;  Location: [facility];  Service: Podiatry;  Laterality: Left;   IRRIGATION AND DEBRIDEMENT FOOT Left 02/24/2020   Procedure: IRRIGATION AND DEBRIDEMENT FOOT;  Surgeon: Caroline More, DPM;  Location: [facility];  Service: Podiatry;  Laterality: Left;   KNEE ARTHROSCOPY     LEFT HEART CATH AND CORS/GRAFTS ANGIOGRAPHY [name] 02/02/2020   Procedure: LEFT HEART CATH  AND CORS/GRAFTS ANGIOGRAPHY;  Surgeon: Teodoro Spray, MD;  Location: [place] [facility];  Service: Cardiovascular;  Laterality: [name];   LOWER EXTREMITY ANGIOGRAPHY Left 02/25/2020   Procedure: Lower Extremity Angiography;  Surgeon: Algernon Huxley, MD;  Location: [place] [facility];  Service: Cardiovascular;  Laterality: Left;   LOWER EXTREMITY ANGIOGRAPHY Left 01/04/2021   Procedure: LOWER EXTREMITY ANGIOGRAPHY;   Surgeon: Algernon Huxley, MD;  Location: [place] [facility];  Service: Cardiovascular;  Laterality: Left;   METATARSAL HEAD EXCISION Left 01/10/2021   Procedure: METATARSAL HEAD EXCISION - LEFT 5th;  Surgeon: Caroline More, DPM;  Location: [facility];  Service: Podiatry;  Laterality: Left;   PERIPHERAL VASCULAR CATHETERIZATION Right 01/24/2016   Procedure: Lower Extremity Angiography;  Surgeon: Katha Cabal, MD;  Location: [place] [facility];  Service: Cardiovascular;  Laterality: Right;   PERIPHERAL VASCULAR CATHETERIZATION Right 01/25/2016   Procedure: Lower Extremity Angiography;  Surgeon: Katha Cabal, MD;  Location: [place] [facility];  Service: Cardiovascular;  Laterality: Right;   TOE AMPUTATION     TONSILLECTOMY      FHx: History reviewed. No pertinent family history.  Social History:  reports that he quit smoking about 6 years ago. His smoking use included cigarettes. He has a 22.50 pack-year smoking history. He has never used smokeless tobacco. He reports that he does not currently use alcohol after a past usage of about 3.0 standard drinks per week. He reports that he does not use drugs.  Allergies:  Allergies  Allergen Reactions   Statins     Other reaction(s): Muscle Pain Causes legs to ache per pt   (Not in a hospital admission)   Physical Exam: General: Alert and oriented.  No apparent distress.  Vascular: DP/PT pulses difficult to palpate due to large amount of substantial swelling to bilateral lower extremities.  Bilateral lower extremity pitting edema present, moderate to severe, associated erythema present to bilateral lower extremities as well.  Neuro: Light touch sensation absent to bilateral lower extremities.  Derm: Multiple venous/lymphedema wounds present to bilateral lower extremities with associated erythema and edema, serosanguineous drainage present from these venous wounds, which all appear to be superficial to level of dermis.  Dry cracked skin  noted to bilateral lower extremities from the lower knee to distal.        Subcutaneous ulceration present to the dorsal aspect of the right IPJ hallux which measures approximately 0.5 x 0.5 x 0.1 cm, no associated erythema or edema present.    Subcutaneous ulceration present to the dorsal aspect of the left second toe at the PIPJ which measures approximately 0.4 x 0.4 x 0.1 cm, no associated erythema or edema present, mild serosanguineous drainage.  Large plantar heel ulcer which measures 2 cm x 1.5 cm x 0.4 cm into the subcutaneous tissue, prior to debridement wound appeared to be 100% necrotic with a large amount of contamination present with foreign debris, mild associated erythema and edema, mild odor, once this was removed the wound bed appeared to be fibro-granular after debridement.  No bone exposed at this time.    MSK: Metatarsus adductus present.  Calcaneal gait/stance bilaterally.  Left and right partial third ray amputations.  Left fifth metatarsal head resection.  Results for orders placed or performed during the hospital encounter of 05/08/21 (from the past 48 hour(s))  Type and screen Ordered by PROVIDER DEFAULT     Status: None   Collection Time: 05/08/21  1:35 PM  Result Value Ref Range  ABO/RH(D) O NEG    Antibody Screen NEG    Sample Expiration      05/11/2021,2359 Performed at [facility], [address] [place], [place] [address]   Urinalysis, Routine w reflex microscopic     Status: Abnormal   Collection Time: 05/08/21 10:02 PM  Result Value Ref Range   Color, Urine YELLOW (A) YELLOW   APPearance CLEAR (A) CLEAR   Specific Gravity, Urine 1.026 1.005 - 1.030   pH 7.0 5.0 - 8.0   Glucose, UA 150 (A) NEGATIVE mg/dL   Hgb urine dipstick NEGATIVE NEGATIVE   Bilirubin Urine NEGATIVE NEGATIVE   Ketones, ur 5 (A) NEGATIVE mg/dL   Protein, ur >=300 (A) NEGATIVE mg/dL   Nitrite NEGATIVE NEGATIVE   Leukocytes,Ua NEGATIVE NEGATIVE   RBC / HPF 0-5 0 -  5 RBC/hpf   WBC, UA 0-5 0 - 5 WBC/hpf   Bacteria, UA NONE SEEN NONE SEEN   Squamous Epithelial / LPF 0-5 0 - 5    Comment: Performed at [facility], [address], [place], [place] [address]  Basic metabolic panel     Status: Abnormal   Collection Time: 05/09/21  7:20 AM  Result Value Ref Range   Sodium 136 135 - 145 mmol/L   Potassium 4.3 3.5 - 5.1 mmol/L   Chloride 96 (L) 98 - 111 mmol/L   CO2 32 22 - 32 mmol/L   Glucose, Bld 178 (H) 70 - 99 mg/dL    Comment: Glucose reference range applies only to samples taken after fasting for at least 8 hours.   BUN 20 8 - 23 mg/dL   Creatinine, Ser 1.15 0.61 - 1.24 mg/dL   Calcium 8.4 (L) 8.9 - 10.3 mg/dL   GFR, Estimated >60 >60 mL/min    Comment: (NOTE) Calculated using the CKD-EPI Creatinine Equation (2021)    Anion gap 8 5 - 15    Comment: Performed at [facility], [address] [place], [place] [address]  Digoxin level     Status: Abnormal   Collection Time: 05/09/21  7:20 AM  Result Value Ref Range   Digoxin Level <0.2 (L) 0.8 - 2.0 ng/mL    Comment: Performed at [facility], [address], [place], [place] [address]  Brain natriuretic peptide     Status: Abnormal   Collection Time: 05/09/21  7:20 AM  Result Value Ref Range   B Natriuretic Peptide 531.5 (H) 0.0 - 100.0 pg/mL    Comment: Performed at [facility], [address] [place], [place] [address]  Hemoglobin A1c     Status: Abnormal   Collection Time: 05/09/21  7:20 AM  Result Value Ref Range   Hgb A1c MFr Bld 9.2 (H) 4.8 - 5.6 %    Comment: (NOTE) Pre diabetes:          5.7%-6.4%  Diabetes:              >6.4%  Glycemic control for   <7.0% adults with diabetes    Mean Plasma Glucose 217.34 mg/dL    Comment: Performed at [facility], [place] [address], [place], [place] [address]  HIV Antibody (routine testing w rflx)     Status: None   Collection Time: 05/09/21  7:20 AM  Result Value Ref Range   HIV Screen 4th  Generation wRfx Non Reactive Non Reactive    Comment: Performed at [facility], [place] [address], [place], [place] [address]  CBG monitoring,  ED     Status: Abnormal   Collection Time: 05/09/21  7:58 AM  Result Value Ref Range   Glucose-Capillary 170 (H) 70 - 99 mg/dL    Comment: Glucose reference range applies only to samples taken after fasting for at least 8 hours.  Resp Panel by RT-PCR (Flu A&B, Covid) Nasopharyngeal Swab     Status: None   Collection Time: 05/09/21  9:50 AM   Specimen: Nasopharyngeal Swab; Nasopharyngeal(NP) swabs in vial transport medium  Result Value Ref Range   SARS Coronavirus 2 by RT PCR NEGATIVE NEGATIVE    Comment: (NOTE) SARS-CoV-2 target nucleic acids are NOT DETECTED.  The SARS-CoV-2 RNA is generally detectable in upper respiratory specimens during the acute phase of infection. The lowest concentration of SARS-CoV-2 viral copies this assay can detect is 138 copies/mL. A negative result does not preclude SARS-Cov-2 infection and should not be used as the sole basis for treatment or other patient management decisions. A negative result may occur with  improper specimen collection/handling, submission of specimen other than nasopharyngeal swab, presence of viral mutation(s) within the areas targeted by this assay, and inadequate number of viral copies(<138 copies/mL). A negative result must be combined with clinical observations, patient history, and epidemiological information. The expected result is Negative.  Fact Sheet for Patients:  EntrepreneurPulse.com.au  Fact Sheet for Healthcare Providers:  IncredibleEmployment.be  This test is no t yet approved or cleared by the [place] FDA and  has been authorized for detection and/or diagnosis of SARS-CoV-2 by FDA under an Emergency Use Authorization (EUA). This EUA will remain  in effect (meaning this test can be used) for the duration of the COVID-19  declaration under Section 564(b)(1) of the Act, 21 U.S.C.section 360bbb-3(b)(1), unless the authorization is terminated  or revoked sooner.       Influenza A by PCR NEGATIVE NEGATIVE   Influenza B by PCR NEGATIVE NEGATIVE    Comment: (NOTE) The Xpert Xpress SARS-CoV-2/FLU/RSV plus assay is intended as an aid in the diagnosis of influenza from Nasopharyngeal swab specimens and should not be used as a sole basis for treatment. Nasal washings and aspirates are unacceptable for Xpert Xpress SARS-CoV-2/FLU/RSV testing.  Fact Sheet for Patients: EntrepreneurPulse.com.au  Fact Sheet for Healthcare Providers: IncredibleEmployment.be  This test is not yet approved or cleared by the [place] FDA and has been authorized for detection and/or diagnosis of SARS-CoV-2 by FDA under an Emergency Use Authorization (EUA). This EUA will remain in effect (meaning this test can be used) for the duration of the COVID-19 declaration under Section 564(b)(1) of the Act, 21 U.S.C. section 360bbb-3(b)(1), unless the authorization is terminated or revoked.  Performed at [facility], [address] [place], [place] [address]   CBG monitoring, ED     Status: Abnormal   Collection Time: 05/09/21 11:29 AM  Result Value Ref Range   Glucose-Capillary 192 (H) 70 - 99 mg/dL    Comment: Glucose reference range applies only to samples taken after fasting for at least 8 hours.  APTT     Status: None   Collection Time: 05/09/21  2:20 PM  Result Value Ref Range   aPTT 34 24 - 36 seconds    Comment: Performed at [facility], [address] [place], [place] [address]  CBG monitoring, ED     Status: Abnormal   Collection Time: 05/09/21  4:26 PM  Result Value Ref Range   Glucose-Capillary 269 (H) 70 - 99 mg/dL    Comment:  Glucose reference range applies only to samples taken after fasting for at least 8 hours.  CBG monitoring, ED     Status: Abnormal    Collection Time: 05/09/21 11:20 PM  Result Value Ref Range   Glucose-Capillary 340 (H) 70 - 99 mg/dL    Comment: Glucose reference range applies only to samples taken after fasting for at least 8 hours.  APTT     Status: Abnormal   Collection Time: 05/09/21 11:39 PM  Result Value Ref Range   aPTT 42 (H) 24 - 36 seconds    Comment:        IF BASELINE aPTT IS ELEVATED, SUGGEST PATIENT RISK ASSESSMENT BE USED TO DETERMINE APPROPRIATE ANTICOAGULANT THERAPY. Performed at [facility], [address] [place], [place] [address]   Basic metabolic panel     Status: Abnormal   Collection Time: 05/10/21  6:56 AM  Result Value Ref Range   Sodium 138 135 - 145 mmol/L   Potassium 4.3 3.5 - 5.1 mmol/L   Chloride 100 98 - 111 mmol/L   CO2 31 22 - 32 mmol/L   Glucose, Bld 192 (H) 70 - 99 mg/dL    Comment: Glucose reference range applies only to samples taken after fasting for at least 8 hours.   BUN 23 8 - 23 mg/dL   Creatinine, Ser 1.14 0.61 - 1.24 mg/dL   Calcium 8.2 (L) 8.9 - 10.3 mg/dL   GFR, Estimated >60 >60 mL/min    Comment: (NOTE) Calculated using the CKD-EPI Creatinine Equation (2021)    Anion gap 7 5 - 15    Comment: Performed at [facility], [place], [place] [address]  Heparin level (unfractionated)     Status: None   Collection Time: 05/10/21  6:56 AM  Result Value Ref Range   Heparin Unfractionated 0.34 0.30 - 0.70 IU/mL    Comment: (NOTE) The clinical reportable range upper limit is being lowered to >1.10 to align with the FDA approved guidance for the current laboratory assay.  If heparin results are below expected values, and patient dosage has  been confirmed, suggest follow up testing of antithrombin III levels. Performed at [facility], [address] [place], [place] [address]   APTT     Status: Abnormal   Collection Time: 05/10/21  6:56 AM  Result Value Ref Range   aPTT 49 (H) 24 - 36 seconds    Comment:         IF BASELINE aPTT IS ELEVATED, SUGGEST PATIENT RISK ASSESSMENT BE USED TO DETERMINE APPROPRIATE ANTICOAGULANT THERAPY. Performed at [facility], [address] [place], [place] [address]   CBC     Status: Abnormal   Collection Time: 05/10/21  6:56 AM  Result Value Ref Range   WBC 7.0 4.0 - 10.5 K/uL   RBC 3.95 (L) 4.22 - 5.81 MIL/uL   Hemoglobin 11.3 (L) 13.0 - 17.0 g/dL   HCT 37.0 (L) 39.0 - 52.0 %   MCV 93.7 80.0 - 100.0 fL   MCH 28.6 26.0 - 34.0 pg   MCHC 30.5 30.0 - 36.0 g/dL   RDW 15.0 11.5 - 15.5 %   Platelets 281 150 - 400 K/uL   nRBC 0.0 0.0 - 0.2 %    Comment: Performed at [facility], [address] [place], [place] [address]  CBG monitoring, ED     Status: Abnormal   Collection Time: 05/10/21  7:15 AM  Result Value Ref Range  Glucose-Capillary 192 (H) 70 - 99 mg/dL    Comment: Glucose reference range applies only to samples taken after fasting for at least 8 hours.  CBG monitoring, ED     Status: Abnormal   Collection Time: 05/10/21 11:43 AM  Result Value Ref Range   Glucose-Capillary 199 (H) 70 - 99 mg/dL    Comment: Glucose reference range applies only to samples taken after fasting for at least 8 hours.   US Venous Img Lower Bilateral  Result Date: 05/08/2021 CLINICAL DATA:  Pain and swelling EXAM: Bilateral LOWER EXTREMITY VENOUS DOPPLER ULTRASOUND TECHNIQUE: Gray-scale sonography with compression, as well as color and duplex ultrasound, were performed to evaluate the deep venous system(s) from the level of the common femoral vein through the popliteal and proximal calf veins. COMPARISON:  None. FINDINGS: VENOUS Normal compressibility of the common femoral, superficial femoral, and popliteal veins, as well as the visualized calf veins. Visualized portions of profunda femoral vein and great saphenous vein unremarkable. No filling defects to suggest DVT on grayscale or color Doppler imaging. Doppler waveforms show normal direction of venous  flow, normal respiratory plasticity and response to augmentation. OTHER None. Limitations: Edema and habitus. IMPRESSION: Negative. Electronically Signed   By: Donavan Foil M.D.   On: 05/08/2021 21:19   DG Foot Complete Left  Result Date: 05/08/2021 CLINICAL DATA:  Bilateral foot wound EXAM: LEFT FOOT - COMPLETE 3+ VIEW COMPARISON:  01/10/2021 FINDINGS: No acute fracture is seen. Previous trans meta tarsal amputation third digit and partial amputation of fifth metatarsal. Cut margins appear smooth. No bony destructive change. No soft tissue emphysema. IMPRESSION: No acute osseous abnormality. Previous transmetatarsal amputation third digit and partial amputation of fifth metatarsal. Electronically Signed   By: Donavan Foil M.D.   On: 05/08/2021 23:06   DG Foot Complete Right  Result Date: 05/08/2021 CLINICAL DATA:  Foot wound EXAM: RIGHT FOOT COMPLETE - 3+ VIEW COMPARISON:  None. FINDINGS: No fracture is seen. Prior transmetatarsal amputation second digit and previous amputation of third digit at the level of the MTP joint. Smooth cut margins. Irregular arthropathy with erosive changes at the first IP joint of uncertain chronicity. There is soft tissue swelling. Negative for soft tissue emphysema. IMPRESSION: 1. Previous partial amputation of the second and third digits without acute findings here 2. Irregular arthropathy at the first IP joint with erosive changes of uncertain chronicity, correlate for signs of active infection here. Evaluation with MRI may be obtained as indicated. Electronically Signed   By: Donavan Foil M.D.   On: 05/08/2021 23:09   ECHOCARDIOGRAM COMPLETE  Result Date: 05/10/2021    ECHOCARDIOGRAM REPORT   Patient Name:   [name]. Date of Exam: 05/10/2021 Medical Rec #:  [id number]             Height:       76.0 in Accession #:    [id number]            Weight:       350.0 lb Date of Birth:  Jan 21, 1956            BSA:          2.810 m Patient Age:    60 years               BP:           114/65 mmHg Patient Gender: M  HR:           118 bpm. Exam Location:  [facility] Procedure: 2D Echo, Cardiac Doppler and Color Doppler Indications:     CHF I50.9  History:         Patient has prior history of Echocardiogram examinations, most                  recent 01/04/2021. Previous Myocardial Infarction; Risk                  Factors:Hypertension and Diabetes.  Sonographer:     [name] Sport Referring Phys:  [id number] Slater TANG Diagnosing Phys: Donnelly Angelica  Sonographer Comments: Technically challenging study due to limited acoustic windows, suboptimal parasternal window, no apical window and no subcostal window. IMPRESSIONS  1. Left ventricular ejection fraction, by estimation, is 50 to 55%. The left ventricle has low normal function. The left ventricle has no regional wall motion abnormalities. There is moderate left ventricular hypertrophy. Left ventricular diastolic function could not be evaluated.  2. Right ventricular systolic function was not well visualized. The right ventricular size is not well visualized.  3. The mitral valve is normal in structure. No evidence of mitral valve regurgitation.  4. The aortic valve is normal in structure. Aortic valve regurgitation is not visualized. Aortic valve sclerosis is present, with no evidence of aortic valve stenosis. FINDINGS  Left Ventricle: Left ventricular ejection fraction, by estimation, is 50 to 55%. The left ventricle has low normal function. The left ventricle has no regional wall motion abnormalities. The left ventricular internal cavity size was normal in size. There is moderate left ventricular hypertrophy. Left ventricular diastolic function could not be evaluated. Right Ventricle: The right ventricular size is not well visualized. Right vetricular wall thickness was not well visualized. Right ventricular systolic function was not well visualized. Left Atrium: Left atrial size was not well visualized. Right Atrium: Right  atrial size was not well visualized. Pericardium: There is no evidence of pericardial effusion. Mitral Valve: The mitral valve is normal in structure. Mild mitral annular calcification. No evidence of mitral valve regurgitation. Tricuspid Valve: The tricuspid valve is normal in structure. Tricuspid valve regurgitation is not demonstrated. Aortic Valve: The aortic valve is normal in structure. Aortic valve regurgitation is not visualized. Aortic valve sclerosis is present, with no evidence of aortic valve stenosis. Pulmonic Valve: The pulmonic valve was not well visualized. Pulmonic valve regurgitation is not visualized. No evidence of pulmonic stenosis. Aorta: The aortic root is normal in size and structure. Venous: The inferior vena cava was not well visualized. IAS/Shunts: The interatrial septum was not well visualized.  LEFT VENTRICLE PLAX 2D LVIDd:         5.80 cm LVIDs:         3.90 cm LV PW:         1.20 cm LV IVS:        1.60 cm LVOT diam:     2.00 cm LVOT Area:     3.14 cm  LEFT ATRIUM         Index LA diam:    5.10 cm 1.81 cm/m                        PULMONIC VALVE AORTA                 PV Vmax:        0.48 m/s Ao Root diam: 3.20 cm PV Vmean:  28.700 cm/s                       PV VTI:         0.057 m                       PV Peak grad:   0.9 mmHg                       PV Mean grad:   0.0 mmHg                       RVOT Peak grad: 1 mmHg   SHUNTS Systemic Diam: 2.00 cm Pulmonic VTI:  0.077 m Donnelly Angelica Electronically signed by Donnelly Angelica Signature Date/Time: 05/10/2021/9:11:30 AM    Final     Blood pressure (!) 152/88, pulse (!) 117, temperature 98.8 F (37.1 C), temperature source Oral, resp. rate 19, height 6\' 4"  (1.93 m), weight (!) 158.8 kg, SpO2 99 %.  Assessment Bilateral lower extremity venous/lymphedema ulcerations with associated cellulitis Left plantar heel wound, subcutaneous neuropathic ulcer Right IPJ hallux dorsal and PIPJ left second toe dorsal neuropathic ulcerations Diabetes  type 2 with polyneuropathy, uncontrolled PVD  Plan -Patient seen and examined. -X-ray imaging reviewed and discussed with patient in detail.  Does not appear to have any signs of osteomyelitis based on x-ray imaging. -Clinically wounds appear to be fairly stable at this time but more concerning is the left plantar heel wound as well as the erythema that is present and swelling to bilateral lower extremities due to uncontrolled venous insufficiency and lymphedema.  Likely some association with CHF as well. -Wound debridements performed as described below without incident.  Superficial gauze debridements and cleaning performed of venous ulcerations to bilateral lower extremities. -Wound care has been consulted.  Recommend applying Santyl to the left heel wound, silver alginate to all other wounds followed by [employer] applications, web roll, Coban from the toes to below knee.  Recommend changing 2 times a week.  If able to go potentially leave the left heel exposed to apply Santyl to the left heel daily, but would still need to wrap up the left forefoot and ankle to control swelling further. -Due to the substantial nature and numerous quantity of wounds he will likely have to follow-up with the wound care center. -Patient needs to be nonweightbearing to the left heel at all times.  We will order a surgical shoe with forefoot wedge and order physical therapy to try to work on ambulating in this manner.  Patient should only be up for short distances and should once again not be putting much weight at all on his left foot due to the heel ulcer that is present. -Discussed with patient and significant other that patient is at high risk for limb loss due to multiple wounds that are present, history of PVD as well as uncontrolled diabetes, substantial swelling and cellulitis present.  Patient appears to have a calcaneal gait bilaterally as well based on examination today left worse than right which is likely  gotten worse with patient's most recent weight gain in which patient weighs now around 400 pounds.  Podiatry team to follow more peripherally at this time.  Debridment of ulcer: Location: Right IPJ hallux Pre-debridement measurement: 0.5 x 0.5 x 0.1 cm Post-debridement measurement: Same Tissue removed:   Fibrous tissue, hyperkeratotic tissue, biofilm Ulcer was debrided sharply with combination  of tissue nippers and scalpel blade into the subcutaneous tissue  Debridment of ulcer: Location: Left plantar heel Pre-debridement measurement: 2 x 1.5 x 0.4 cm Post-debridement measurement: Same Tissue removed:   Fibrous tissue, necrotic tissue, biofilm, hyperkeratotic tissue Ulcer was debrided sharply with combination of tissue nippers and scalpel blade into the subcutaneous tissue  Debridment of ulcer: Location: Left second toe PIPJ dorsal Pre-debridement measurement: 0.4 x 0.4 x 0.1 cm Post-debridement measurement: Same Tissue removed:   Fibrous tissue, biofilm, hyperkeratotic tissue Ulcer was debrided sharply with combination of tissue nippers and scalpel blade into the subcutaneous tissue  Caroline More, DPM 05/10/2021, 1:03 PM

## 2021-05-10 NOTE — ED Notes (Signed)
This RN went into this pt's room upon seeing pt O2 sats of 80, upon entering this pt's room this RN noted this pt's [place] was on the floor. This RN replaced the [place] and asked the pt and pt's wife what happened, pt lethargic at this time, wife reported she had taken it off to get this pt more comfortable and had forgotten to put it back on. This RN stressed how important it was that this pt's [place] stay in place d/t pt being dependent on supplemental oxygen, this pt's wife stated "it's no big deal. He's fine" This RN educated wife on how critical pt's O2 readings were and that they were going to continue to drop without the [place] in place which could be life threatening to this pt. This pt's wife verbalized understanding at this time.

## 2021-05-10 NOTE — ED Notes (Signed)
Kara RN aware of assigned bed 

## 2021-05-10 NOTE — Consult Note (Signed)
[place] for heparin Indication: atrial fibrillation  Allergies  Allergen Reactions   Statins     Other reaction(s): Muscle Pain Causes legs to ache per pt    Patient Measurements: Height: 6\' 4"  (193 cm) Weight: (!) 158.8 kg (350 lb) IBW/kg (Calculated) : 86.8 Heparin Dosing Weight: 123.6 kg  Vital Signs: Temp: 98.8 F (37.1 C) (01/10 1840) Temp Source: Oral (01/10 1840) BP: 139/92 (01/10 2325) Pulse Rate: 119 (01/10 2325)  Labs: Recent Labs    05/08/21 1154 05/09/21 0720 05/09/21 1420 05/09/21 2339  HGB 11.7*  --   --   --   HCT 39.2  --   --   --   PLT 289  --   --   --   APTT  --   --  34 42*  LABPROT 14.8  --   --   --   INR 1.2  --   --   --   CREATININE 1.02 1.15  --   --      Estimated Creatinine Clearance: 103.3 mL/min (by C-G formula based on SCr of 1.15 mg/dL).   Medications:  PTA: Eliquis 5 mg BID, last dose 05/08/21 am Inpatient: Pt received lovenox 80 mg on 1/10 at 0807 for DVT prophylaxis  Assessment: 66 y.o. male with medical history significant for CAD, HTN, BPH, DM, PAD, chronic ulcer left foot, A. fib on apixaban who presents to the ED for evaluation for worsening bilateral lower extremity edema. Pharmacy has been consulted for heparin dosing for afib.  Baseline labs: Hgb 11.7, plts 289, INR 1.2, aPTT ordered   Goal of Therapy:  Heparin level 0.3-0.7 units/ml aPTT 66-102 seconds Monitor platelets by anticoagulation protocol: Yes  1/10 02339 aPTT 42, subtherapeutic   Plan:  Give 3700 units bolus x 1 Increase heparin infusion to 2100 units/hr Recheck aPTT level in 6 hours after rate change. Continue to monitor aPTT levels until aPTT and daily heparin level correlate  Continue to monitor H&H and platelets  Renda Rolls, PharmD, [facility] 05/10/2021 12:22 AM

## 2021-05-10 NOTE — ED Notes (Signed)
Spoke with [place] in [employer], he will bring the [employer] down, informed him pts room number and RN

## 2021-05-10 NOTE — ED Notes (Addendum)
This RN requested Secretary Nitchia order two Unna's boots for this pt from materials at this time.

## 2021-05-10 NOTE — Consult Note (Signed)
Heart Failure Nurse Navigator Note  HFpEF 50 to 55%.  Moderate left ventricular hypertrophy.  Had previously been reported in September 2022 at 60 to 65%.  He presented to the emergency room with complaints of worsening shortness of breath, lower extremity edema.  Comorbidities:  Coronary artery disease status post CABG in 2006 PCI to the RCA 2021 Hypertension Diabetes Class III obesity Paroxysmal atrial fibrillation on anticoagulation Sleep apnea compliant with CPAP use Venous stasis  Medications:  Amiodarone infusion Furosemide 60 mg IV twice a day Atorvastatin 40 mg daily Aspirin 81 mg Cardizem CD 300 mg daily Zetia 10 mg daily Isosorbide mononitrate 60 mg twice daily Digoxin 0.0625 mg daily Heparin infusion  Currently on hold  Eliquis 5 mg twice daily  Labs:  BNP elevated at 531, in September that had been 100.  Sodium 138, potassium 4.3, chloride 100, CO2 31, BUN 23, creatinine 1.14 Weight is 158.8 kg Blood pressure 121/86     Initial meeting with patient and wife today in the emergency room.  Discussed heart failure and the function of his heart.  Patient admits to using salt at the table.  Explained the reasoning behind behind removing the saltshaker from the table.  Explained that salt/sodium makes the body hold onto extra fluid resulting and swelling of the abdomen, legs, feet and hands, etc. went over using natural spices or Mrs. Dash, along with vinegar, lemon juice etc.  Discussed low-sodium diet, wife states that he had previously been on keto diet but with the illness of the patient's mother and her death they had gotten away from that but do plan get back on that diet.  Patient states that he had loss approximately 50 pounds with that diet.  Explained the importance of sticking with the 64 ounce, fluid restriction.  Went over what patient drinks within a days time, he has been known to drink 1-2 pots of coffee with each pot containing 12 cups.   Also will drink bottled water, tea and diet soda.  Wife goes on to state that he will make extra coffee to fill a large thermal cup.  Recommended taking a measuring cup so they know exactly how much liquid that thermal cup holds.  Stressed the importance of sticking with no more than 64 ounces in a days time unless he is vomiting, diarrhea or profusely sweating.  Also discussed decreasing caffeine intake and his heart rhythm.  Wife states that her recent appointment patients diuretic had been increased along with that was instructed to increase his fluid intake.  Explained the reasoning behind fluid restriction and diuretics.  She voices understanding.  Patient has a scale and does weigh himself.  Stressed the importance of weighing himself daily at the same time with the same amount of close or in the new.  Also discussed recording and reporting a 2 to 3 pound weight gain overnight or 5 pounds within the week.  Also discussed follow-up in the outpatient heart failure clinic.  He has an appointment on January 20 at 9 AM.  He has a 9% rating of no-show, 3 out of 32 appointments.  Also stressed to bring the medication bottles with him to the appointment.  Wife states that he has quite a few and wondered if just making a list would not be sufficient.  Explained to her that it is better to check the medication bottles, she was given the red clinic bag for transporting the medications.  They had no further questions at this time.  Pricilla Riffle RN [facility]

## 2021-05-10 NOTE — Progress Notes (Signed)
Inpatient Diabetes Program Recommendations  AACE/ADA: New Consensus Statement on Inpatient Glycemic Control   Target Ranges:  Prepandial:   less than 140 mg/dL      Peak postprandial:   less than 180 mg/dL (1-2 hours)      Critically ill patients:  140 - 180 mg/dL    Latest Reference Range & Units 05/09/21 07:58 05/09/21 11:29 05/09/21 16:26 05/09/21 23:20 05/10/21 07:15  Glucose-Capillary 70 - 99 mg/dL 170 (H) 192 (H) 269 (H) 340 (H) 192 (H)    Latest Reference Range & Units 05/09/21 07:20  Hemoglobin A1C 4.8 - 5.6 % 9.2 (H)   Review of Glycemic Control  Diabetes history: DM2 Outpatient Diabetes medications: Lantus 50 units BID, Novolog 15 units TID with meals, plus Novolog correction scale, Metformin 1000 mg BID Current orders for Inpatient glycemic control: Semglee 50 units BID, Novolog 0-20 units TID with meals, Novolog 0-5 units QHS  Inpatient Diabetes Program Recommendations:    Insulin: In reviewing chart, noted Semglee 50 units was charted as not given last night at 22:22 and fasting glucose 192 mg/dl. Patient already received Semglee 50 units this morning. Will follow trends. If any hypoglycemia, may need to decrease Semglee dose.  HbgA1C:  A1C 9.2% on 05/09/21 indicating an average glucose of 217 mg/dl over the past 2-3 months.  NOTE: Spoke with patient over the phone about diabetes medications and control. Patient reports being followed by Dr. Honor Junes (Endocrinologist) and was last seen on 04/28/21. Patient reports that he is taking Lantus 40-50 units BID, Novolog sliding scale TID, and Metformin 1000 mg BID as an outpatient for diabetes control. Per office note by Dr. Honor Junes on 04/28/21, patient was asked to "Change your lantus to 80 units every morning.  Take 15 of novolog before most meals.  If you are under 125 before the meal, take 7 instead of 15 If you are over 200 before the meal, take 18 instead of 15" Discussed Dr. Sherren Mocha noted insulin dosages and patient  reports that he has been through a lot the past few weeks and he is not sure why Dr. Honor Junes changed his Lantus to 80 units once a day. Informed patient hat per office note, patient had reported missing evening dose of Lantus due to falling asleep which is why Lantus was changed to 80 units once a day. Patient reports he was still taking the insulin as he had been BID and he feels this works best for him. Patient also notes that he does forget to take Lantus as well as Novolog at times. Patient reports that over the past couple of months he ran out of Novolog and went a few days without it and had to use a bottle of his wife's insulin. Patient also notes that the insulin is expensive and he has went to [place] and bought Novolin Regular at times to use in place of Novolog.  Patient reports that he uses [employer] [place] for glucose monitoring and his glucose had been trending fairly good lately.  Discussed A1C results (9.2% on 05/09/21) and explained that current A1C indicates an average glucose of 217 mg/dl over the past 2-3 months. Discussed that prior A1C was 8.3% on 03/15/21, so current A1C is higher.  Discussed glucose and A1C goals. Discussed importance of checking CBGs and maintaining good CBG control to prevent long-term and short-term complications. Patient states he has a follow up appointment with Dr. Honor Junes 6 months out from last appointment. Encouraged patient to reach out to Dr. Honor Junes to  clarify how he should be taking his insulin. Patient states he feels that the Lantus 40-50 units BID works well for him and he prefers to continue taking it BID. Encouraged patient to reach out to Dr. Sherren Mocha office if glucose are consistently over 180 mg/dl at home as he may need some changes with insulin regimen. Patient verbalized understanding of information discussed and reports no further questions at this time related to diabetes.  Thanks, Barnie Alderman, RN, MSN, CDE Diabetes Coordinator Inpatient  Diabetes Program [phone] (Team Pager)

## 2021-05-11 LAB — GLUCOSE, CAPILLARY
Glucose-Capillary: 305 mg/dL — ABNORMAL HIGH (ref 70–99)
Glucose-Capillary: 281 mg/dL — ABNORMAL HIGH (ref 70–99)
Glucose-Capillary: 253 mg/dL — ABNORMAL HIGH (ref 70–99)
Glucose-Capillary: 340 mg/dL — ABNORMAL HIGH (ref 70–99)

## 2021-05-11 LAB — BASIC METABOLIC PANEL
Anion gap: 4 — ABNORMAL LOW (ref 5–15)
BUN: 20 mg/dL (ref 8–23)
CO2: 33 mmol/L — ABNORMAL HIGH (ref 22–32)
Calcium: 8 mg/dL — ABNORMAL LOW (ref 8.9–10.3)
Chloride: 100 mmol/L (ref 98–111)
Creatinine, Ser: 1.01 mg/dL (ref 0.61–1.24)
GFR, Estimated: >60 mL/min (ref >60)
Glucose, Bld: 301 mg/dL — ABNORMAL HIGH (ref 70–99)
Potassium: 4 mmol/L (ref 3.5–5.1)
Sodium: 137 mmol/L (ref 135–145)

## 2021-05-11 LAB — CULTURE, BLOOD (ROUTINE X 2): Special Requests: ADEQUATE

## 2021-05-11 LAB — MAGNESIUM: Magnesium: 2 mg/dL (ref 1.7–2.4)

## 2021-05-11 MED ORDER — METOPROLOL TARTRATE 25 MG PO TABS
25.0000 mg | ORAL_TABLET | Freq: Two times a day (BID) | ORAL | Status: DC
  Administered 2021-05-11 – 2021-05-12 (×3): 25 mg via ORAL
  Filled 2021-05-11 (×3): qty 1

## 2021-05-11 MED ORDER — INSULIN GLARGINE-YFGN 100 UNIT/ML ~~LOC~~ SOLN
55.0000 [IU] | Freq: Two times a day (BID) | SUBCUTANEOUS | Status: DC
  Administered 2021-05-11 – 2021-05-16 (×10): 55 [IU] via SUBCUTANEOUS
  Filled 2021-05-11 (×11): qty 0.55

## 2021-05-11 MED ORDER — AMOXICILLIN-POT CLAVULANATE 875-125 MG PO TABS
1.0000 | ORAL_TABLET | Freq: Two times a day (BID) | ORAL | Status: DC
  Administered 2021-05-11 – 2021-05-16 (×11): 1 via ORAL
  Filled 2021-05-11 (×11): qty 1

## 2021-05-11 MED ORDER — FUROSEMIDE 40 MG PO TABS: 60.0000 mg | ORAL_TABLET | Freq: Every day | ORAL | Status: DC

## 2021-05-11 MED ORDER — FUROSEMIDE 10 MG/ML IJ SOLN
60.0000 mg | Freq: Once | INTRAMUSCULAR | Status: AC
  Administered 2021-05-11: 60 mg via INTRAVENOUS
  Filled 2021-05-11: qty 6

## 2021-05-11 MED ORDER — AMIODARONE HCL 200 MG PO TABS: 200.0000 mg | ORAL_TABLET | Freq: Every day | ORAL | Status: DC

## 2021-05-11 MED ORDER — INSULIN ASPART 100 UNIT/ML IJ SOLN
6.0000 [IU] | Freq: Three times a day (TID) | INTRAMUSCULAR | Status: DC
  Administered 2021-05-11 – 2021-05-14 (×8): 6 [IU] via SUBCUTANEOUS
  Filled 2021-05-11 (×8): qty 1

## 2021-05-11 MED ORDER — AMIODARONE HCL 200 MG PO TABS
200.0000 mg | ORAL_TABLET | Freq: Two times a day (BID) | ORAL | Status: DC
  Administered 2021-05-11 – 2021-05-16 (×11): 200 mg via ORAL
  Filled 2021-05-11 (×11): qty 1

## 2021-05-11 NOTE — Care Management Important Message (Signed)
Important Message  Patient Details  Name: [name]. MRN: [id number] Date of Birth: Feb 19, 1956   Medicare Important Message Given:  Yes     [name] 05/11/2021, 12:59 PM

## 2021-05-11 NOTE — Evaluation (Signed)
Occupational Therapy Evaluation Patient Details Name: [name]. MRN: [id number] DOB: December 01, 1955 Today's Date: 05/11/2021   History of Present Illness [name]. is a 66 y.o. male with PMH of CAD s/p CABG and subsequent PCI in October 2021, HFpEF, hypertension, type 2 diabetes, morbid obesity, PAD, atrial fibrillation who presents with worsening lower extremity edema and weeping of his chronic lower extremity wounds.   Clinical Impression   [name] was seen for OT/PT co-evaluation this date. Prior to hospital admission, pt was MOD I for mobility using RW and assist for LBD from wife. Pt lives with spouse in split level home c level entry. Pt presents to acute OT demonstrating impaired ADL performance and functional mobility 2/2 decreased activity tolerance and functional strength/ROM/balance deficits. Upon arrival pt seated EOB - noted to be Wbing through L heel. Pt reports impaired BLE sensation, unable to detect L heel Wbing. Family and pt educated re: functional application of [place], home/routines modifications, and AE/DME recs.   Pt currently requires MAX A don L wedge shoe sitting EOB. MIN A + RW for ADL t/f - assist to stabilize RW and cues for technique/safety. Pt left sitting EOB with wedge shoe donner, bed alarm set, RN notified. Pt would benefit from skilled OT to address noted impairments and functional limitations (see below for any additional details). Upon hospital discharge, recommend HHOT to maximize pt safety and return to PLOF.       Recommendations for follow up therapy are one component of a multi-disciplinary discharge planning process, led by the attending physician.  Recommendations may be updated based on patient status, additional functional criteria and insurance authorization.   Follow Up Recommendations  Home health OT    Assistance Recommended at Discharge Frequent or constant Supervision/Assistance  Patient can return home with the  following A little help with walking and/or transfers;A lot of help with bathing/dressing/bathroom;Assistance with cooking/housework    Functional Status Assessment  Patient has had a recent decline in their functional status and demonstrates the ability to make significant improvements in function in a reasonable and predictable amount of time.  Equipment Recommendations  BSC/3in1    Recommendations for Other Services       Precautions / Restrictions Precautions Precautions: Fall Required Braces or Orthoses: Other Brace Other Brace: L wedge shoe Restrictions Weight Bearing Restrictions: Yes LLE Weight Bearing: Touchdown weight bearing Other Position/Activity Restrictions: "NWB to L heel at all times.  L wedge shoe for short distances and should not be putting much weight at all on his left foot" - podiatry note 05/10/21.      Mobility Bed Mobility               General bed mobility comments: received and left sitting EOB    Transfers Overall transfer level: Needs assistance Equipment used: Rolling walker (2 wheels) Transfers: Sit to/from Stand Sit to Stand: Min assist                  Balance Overall balance assessment: Needs assistance Sitting-balance support: No upper extremity supported;Feet supported Sitting balance-Leahy Scale: Good     Standing balance support: Bilateral upper extremity supported Standing balance-Leahy Scale: Fair                             ADL either performed or assessed with clinical judgement   ADL Overall ADL's : Needs assistance/impaired  General ADL Comments: MAX A don L wedge shoe sitting EOB. MIN A + RW for ADL t/f - assist to stabilize RW and cues for technique/safety.      Pertinent Vitals/Pain Pain Assessment: No/denies pain     Hand Dominance Right   Extremity/Trunk Assessment Upper Extremity Assessment Upper Extremity Assessment: Overall WFL  for tasks assessed   Lower Extremity Assessment Lower Extremity Assessment: Generalized weakness (impaired sensation - reports baseline)       Communication Communication Communication: No difficulties   Cognition Arousal/Alertness: Awake/alert Behavior During Therapy: WFL for tasks assessed/performed Overall Cognitive Status: Within Functional Limits for tasks assessed                                 General Comments: poor adherence to [place] - limited by decreased BLE sensation      Home Living Family/patient expects to be discharged to:: Private residence Living Arrangements: Spouse/significant other Available Help at Discharge: Family Type of Home: House Home Access: Level entry     Home Layout: Multi-level     Bathroom Shower/Tub: Teacher, early years/pre: Handicapped height     Home Equipment: Conservation officer, nature (2 wheels);Cane - quad;Shower seat - built in   Additional Comments: Bari RW; Futures trader; Investment banker, corporate.  Per wife, she has a hisory of back problems and can provide very limited physical assist.      Prior Functioning/Environment Prior Level of Function : Independent/Modified Independent               ADLs Comments: wife assists for LB dressing        OT Problem List: Decreased strength;Decreased range of motion;Decreased activity tolerance;Impaired balance (sitting and/or standing);Decreased safety awareness;Decreased knowledge of precautions;Impaired sensation      OT Treatment/Interventions: Therapeutic exercise;Self-care/ADL training;Energy conservation;DME and/or AE instruction;Therapeutic activities;Patient/family education;Balance training    OT Goals(Current goals can be found in the care plan section) Acute Rehab OT Goals Patient Stated Goal: to go home OT Goal Formulation: With patient/family Time For Goal Achievement: 05/25/21 Potential to Achieve Goals: Good ADL Goals Pt Will Perform Grooming: with  modified independence;sitting (c no cues for WBing pcns) Pt Will Transfer to Toilet: with modified independence;ambulating;bedside commode (c LRAD PRN) Pt Will Perform Toileting - Clothing Manipulation and hygiene: with modified independence;sitting/lateral leans  OT Frequency: Min 2X/week    Co-evaluation PT/OT/SLP Co-Evaluation/Treatment: Yes Reason for Co-Treatment: To address functional/ADL transfers PT goals addressed during session: Mobility/safety with mobility OT goals addressed during session: ADL's and self-care      AM-PAC OT "6 Clicks" Daily Activity     Outcome Measure Help from another person eating meals?: None Help from another person taking care of personal grooming?: None Help from another person toileting, which includes using toliet, bedpan, or urinal?: A Little Help from another person bathing (including washing, rinsing, drying)?: A Lot Help from another person to put on and taking off regular upper body clothing?: A Little Help from another person to put on and taking off regular lower body clothing?: A Lot 6 Click Score: 18   End of Session Equipment Utilized During Treatment: Rolling walker (2 wheels) Nurse Communication: Mobility status  Activity Tolerance: Patient tolerated treatment well Patient left: in bed;with call bell/phone within reach;with bed alarm set;with family/visitor present  OT Visit Diagnosis: Unsteadiness on feet (R26.81);Other abnormalities of gait and mobility (R26.89)  Time: 4818-5909 OT Time Calculation (min): 35 min Charges:  OT General Charges $OT Visit: 1 Visit OT Evaluation $OT Eval Moderate Complexity: 1 Mod OT Treatments $Self Care/Home Management : 8-22 mins  [name], M.S. OTR/L  05/11/21, 3:25 PM  ascom [phone]

## 2021-05-11 NOTE — Progress Notes (Signed)
Inpatient Diabetes Program Recommendations  AACE/ADA: New Consensus Statement on Inpatient Glycemic Control (2015)  Target Ranges:  Prepandial:   less than 140 mg/dL      Peak postprandial:   less than 180 mg/dL (1-2 hours)      Critically ill patients:  140 - 180 mg/dL    Latest Reference Range & Units 05/10/21 07:15 05/10/21 11:43 05/10/21 16:50 05/10/21 21:34  Glucose-Capillary 70 - 99 mg/dL 192 (H)  4 units Novolog  50 units Semglee @0948  199 (H)  4 units Novolog  291 (H)  11 units Novolog @1823  322 (H)  4 units Novolog  50 units Semglee @2226      Latest Reference Range & Units 05/11/21 08:15  Glucose-Capillary 70 - 99 mg/dL 305 (H)  15 units Novolog   (H): Data is abnormally high    Home DM Meds: Lantus 50 units BID     Novolog 15 units TID with meals, plus Novolog correction scale     Metformin 1000 mg BID   Current Orders: Semglee 50 units BID  Novolog 0-20 units TID ac/hs    MD- Note CBG 305 this AM.  Please consider:  1. Increase Semglee to 55 units BID (10% increase)  2. Start Novolog Meal Coverage: Novolog 6 units TID with meals Hold if pt eats <50% of meal, Hold if pt NPO    --Will follow patient during hospitalization--  Wyn Quaker RN, MSN, CDE Diabetes Coordinator Inpatient Glycemic Control Team Team Pager: [phone] (8a-5p)

## 2021-05-11 NOTE — Evaluation (Signed)
Physical Therapy Evaluation Patient Details Name: [name]. MRN: [id number] DOB: 22-Mar-1956 Today's Date: 05/11/2021  History of Present Illness  Calen Geister. is a 66 y.o. male with PMH of CAD s/p CABG and subsequent PCI in October 2021, HFpEF, hypertension, type 2 diabetes, morbid obesity, PAD, atrial fibrillation who presents with worsening lower extremity edema and weeping of his chronic lower extremity wounds.   Clinical Impression  Pt admitted with above diagnosis.  Pt received with OT and spouse present in room, resting at EOB. Per OT pt sitting EOB with L heel Wb'ing on floor. Pt, OT, and spouse reporting to PT on DME, PLOF, DME, assist that can be provided by spouse at home. At baseline pt mod-I with mobility with walking stick but most recently with BRW. Receives help with LB dressing from wife. Pt on Amidarone drip so limited in mobility at eval. Required assist in donning L forefoot wedge shoe for NWB'ing on L heel for mobility. Pt does display unsafe standing techniques initially requiring extensive cuing for hand placement, anterior weight shift, using LUE on counter top to pull himself into standing with bed slightly elevated. Required frequent cuing for maintaining Wb'ing precautions ambulating 10-15~ within room with fair carryover. Overall pt is very steady with BRW use, but does have difficulty with Wb'ing precautions with shoe on due to baseline sensation deficits from peripheral neuropathy. Pt able to return to seated Eob with mod cuing for BRW sequencing and hand placement prior to descent with fair carryover. PT/OT educating pt and spouse on safe standing practices to BRW due to inability to rely on counter tops in home environment. Post education, with min guard, pt able to attain standing with safe hand placement to BRW with good stability. Will continue to follow pt acutely to ensure improved compliance with L foot WB'ing precautions and progress safe mobility  as medically appropriate. Will benefit from [facility] PT at discharge due to deficits in safe use of DME, difficulty transferring, and minor balance deficits with maintenance of forefoot wedge shoe for mobility on L foot. Pt currently with functional limitations due to the deficits listed below (see PT Problem List). Pt will benefit from skilled PT to increase their independence and safety with mobility to allow discharge to the venue listed below.      Recommendations for follow up therapy are one component of a multi-disciplinary discharge planning process, led by the attending physician.  Recommendations may be updated based on patient status, additional functional criteria and insurance authorization.  Follow Up Recommendations Home health PT    Assistance Recommended at Discharge Frequent or constant Supervision/Assistance  Patient can return home with the following  A little help with walking and/or transfers;Assistance with cooking/housework;Assist for transportation;A little help with bathing/dressing/bathroom    Equipment Recommendations BSC/3in1 (bariatric)  Recommendations for Other Services       Functional Status Assessment Patient has had a recent decline in their functional status and demonstrates the ability to make significant improvements in function in a reasonable and predictable amount of time.     Precautions / Restrictions Precautions Precautions: Fall Required Braces or Orthoses: Other Brace Other Brace: L wedge shoe Restrictions Weight Bearing Restrictions: Yes LLE Weight Bearing: Touchdown weight bearing Other Position/Activity Restrictions: "NWB to L heel at all times.  L wedge shoe for short distances and should not be putting much weight at all on his left foot" - podiatry note 05/10/21.      Mobility  Bed Mobility  General bed mobility comments: received and left sitting EOB Patient Response: Cooperative  Transfers Overall transfer level:  Needs assistance Equipment used: Rolling walker (2 wheels) Transfers: Sit to/from Stand Sit to Stand: Min assist           General transfer comment: EOB slightly elevated to match recliner height at home. Requirs cuing and eduation for safe hand placement.    Ambulation/Gait Ambulation/Gait assistance: Min guard Gait Distance (Feet): 10 Feet Assistive device: Rolling walker (2 wheels) Gait Pattern/deviations: Step-to pattern       General Gait Details: step to requiring mod to max VC's to maintain weight on L forefoot with forefoot wedge shoe on. Difficulty with weightbearing precautions due to neuropathy  Stairs            Wheelchair Mobility    Modified Rankin (Stroke Patients Only)       Balance Overall balance assessment: Needs assistance Sitting-balance support: No upper extremity supported;Feet supported Sitting balance-Leahy Scale: Good     Standing balance support: Bilateral upper extremity supported Standing balance-Leahy Scale: Fair                               Pertinent Vitals/Pain Pain Assessment: No/denies pain    Home Living Family/patient expects to be discharged to:: Private residence Living Arrangements: Spouse/significant other Available Help at Discharge: Family Type of Home: House Home Access: Level entry     Alternate Level Stairs-Number of Steps: several areas of 1-2 steps to access kitchen & various parts of home Home Layout: Multi-level Home Equipment: [place] (2 wheels);Cane - quad;Shower seat - built in Additional Comments: Bari RW; Futures trader; Investment banker, corporate.  Per wife, she has a history of back problems and can provide very limited physical assist.    Prior Function Prior Level of Function : Independent/Modified Independent               ADLs Comments: wife assists for LB dressing     Hand Dominance   Dominant Hand: Right    Extremity/Trunk Assessment   Upper Extremity Assessment Upper  Extremity Assessment: Overall WFL for tasks assessed    Lower Extremity Assessment Lower Extremity Assessment: Generalized weakness (poor sensation in BLE's due to peripheral neuropathy)    Cervical / Trunk Assessment Cervical / Trunk Assessment: Normal  Communication   Communication: No difficulties  Cognition Arousal/Alertness: Awake/alert Behavior During Therapy: WFL for tasks assessed/performed Overall Cognitive Status: Within Functional Limits for tasks assessed                                 General Comments: poor adherence to [place] - limited by decreased BLE sensation        General Comments General comments (skin integrity, edema, etc.): HR trending 115-117 BPM at rest and with mobility.    Exercises Other Exercises Other Exercises: Role of PT in acute setting, DME recs, D/c recs, safe STS technique with RW.   Assessment/Plan    PT Assessment Patient needs continued PT services  PT Problem List Decreased strength;Decreased range of motion;Decreased knowledge of use of DME;Decreased activity tolerance;Decreased safety awareness;Decreased balance;Decreased mobility;Decreased knowledge of precautions;Impaired sensation       PT Treatment Interventions DME instruction;Balance training;Gait training;Neuromuscular re-education;Stair training;Functional mobility training;Patient/family education;Therapeutic activities;Therapeutic exercise    PT Goals (Current goals can be found in the Care Plan section)  Acute Rehab PT  Goals Patient Stated Goal: to go home, stay active PT Goal Formulation: With patient Time For Goal Achievement: 05/25/21 Potential to Achieve Goals: Good    Frequency Min 2X/week     Co-evaluation PT/OT/SLP Co-Evaluation/Treatment: Yes Reason for Co-Treatment: Complexity of the patient's impairments (multi-system involvement);For patient/therapist safety;To address functional/ADL transfers PT goals addressed during session:  Mobility/safety with mobility;Proper use of DME OT goals addressed during session: ADL's and self-care       AM-PAC PT "6 Clicks" Mobility  Outcome Measure Help needed turning from your back to your side while in a flat bed without using bedrails?: A Little Help needed moving from lying on your back to sitting on the side of a flat bed without using bedrails?: A Little Help needed moving to and from a bed to a chair (including a wheelchair)?: A Little Help needed standing up from a chair using your arms (e.g., wheelchair or bedside chair)?: A Lot Help needed to walk in hospital room?: A Little Help needed climbing 3-5 steps with a railing? : A Lot 6 Click Score: 16    End of Session Equipment Utilized During Treatment: Gait belt Activity Tolerance: Patient tolerated treatment well Patient left: in bed;with bed alarm set;with family/visitor present Nurse Communication: Mobility status PT Visit Diagnosis: Unsteadiness on feet (R26.81);Other abnormalities of gait and mobility (R26.89);Difficulty in walking, not elsewhere classified (R26.2)    Time: 6168-3729 PT Time Calculation (min) (ACUTE ONLY): 23 min   Charges:   PT Evaluation $PT Eval Moderate Complexity: 1 Mod PT Treatments $Gait Training: 8-22 mins       Salem Caster. Fairly IV, PT, DPT Physical Therapist- [facility]  05/11/2021, 3:37 PM

## 2021-05-11 NOTE — Progress Notes (Signed)
PT Cancellation Note  Patient Details Name: [name]. MRN: [id number] DOB: May 09, 1955   Cancelled Treatment:    Reason Eval/Treat Not Completed: Other (comment). Orders received, chart reviewed. Per entry to room, L shoe has not been delivered to maintain L foot WB precautions. Medical team notified. Will monitor for delivery of shoe before initiating therapy.    Salem Caster. Fairly IV, PT, DPT Physical Therapist- [facility]  05/11/2021, 9:40 AM

## 2021-05-11 NOTE — Consult Note (Signed)
[place] Nurse wound follow up Lymphedema to bilateral legs, open wounds to right leg and left heel, left toe.  Wound type:neuropathic and venous insufficiency Measurement: Left heel: 2.5 cm x 2 cm x 0.3 cm  Wound bed: white, stringy fibrin present Drainage (amount, consistency, odor) minimal serosanguinous no odor Periwound: Dry skin, edema Dressing procedure/placement/frequency: Cleanse bilateral lower legs with soap and water twice weekly.  Apply silver absorptive dressing to open wounds.  Apply Santyl to left heel wound and top with NS moist gauze.  Wrap both legs with zinc layer and secure with self adherent coban dressing.  Daily (between [employer] dressings):  Bedside RN to remove brown (COBAN ) layer to left leg and cut away heel dressing to expose wound.  Apply Santyl ointment to wound bed and secure with NS moist gauze.  Re-wrap leg with COban layer (50% overlap).   Will follow.  Domenic Moras MSN, RN, FNP-BC [facility] Wound, Ostomy, Continence Nurse Pager [phone]

## 2021-05-11 NOTE — Progress Notes (Signed)
CARDIOLOGY CONSULT NOTE               Patient ID: [name]. MRN: [id number] DOB/AGE: 05/16/1955 66 y.o.  Admit date: 05/08/2021 Referring Physician Dr Damita Dunnings Primary Physician Dr. Doy Hutching Primary Cardiologist Dr. Bartholome Bill  Reason for Consultation heart failure  HPI: pt is a [phone] with a PMH significant for CAD s/p CABG 2006 and PCI of RCA 01/2020, HFpEF (LVEF 60 to 65% 12/2020),HTN, BPH, DM, class III obesity, PAD s/p left third toe amputation, chronic venous stasis on Unna boots,  A. fib on apixaban, amiodarone and digoxin, sleep apnea, chronic ulcer left foot, , OSA on CPAP who presents to the emergency room for evaluation of worsening bilateral lower extremity edema associated with weeping of the extremities.  He was hospitalized 12/2020 with a similar presentation of lower extremity cellulitis and had strep bacteremia at that time. Cardiology was consulted for assistance with diuresis and his heart failure.  Interval history: -off O2 by [place] and feels like he has more energy and is breathing better than yesterday.  -denies chest pain, sob, palpitations. -tolerated wound debridement well with podiatry.  -remains in atrial flutter with rate ~118 -heparin gtt discontinued yesterday and was started back on eliquis last night.   Review of systems complete and found to be negative unless listed above   Past Medical History:  Diagnosis Date   Asthma    Coronary artery disease    Depression    Diabetes mellitus without complication ([place])    Gout    History anabolic steroid use    Hyperlipidemia    Hypertension    Hypogonadism in male    Morbid obesity ([place])    Myocardial infarction ([place])    Peripheral vascular disease ([place])    Perirectal abscess    Pleurisy    Sleep apnea    CPAP at night, no oxygen   Varicella     Past Surgical History:  Procedure Laterality Date   ABDOMINAL AORTIC ANEURYSM REPAIR     ACHILLES TENDON SURGERY Left 01/10/2021   Procedure:  ACHILLES LENGTHENING/KIDNER;  Surgeon: Caroline More, DPM;  Location: [facility];  Service: Podiatry;  Laterality: Left;   AMPUTATION TOE Right 02/10/2016   Procedure: AMPUTATION TOE 3RD TOE;  Surgeon: Samara Deist, DPM;  Location: [facility];  Service: Podiatry;  Laterality: Right;   AMPUTATION TOE Left 02/24/2020   Procedure: AMPUTATION TOE;  Surgeon: Caroline More, DPM;  Location: [facility];  Service: Podiatry;  Laterality: Left;   APPLICATION OF WOUND VAC Left 02/29/2020   Procedure: APPLICATION OF WOUND VAC;  Surgeon: Caroline More, DPM;  Location: [facility];  Service: Podiatry;  Laterality: Left;   COLONOSCOPY WITH PROPOFOL [name] 11/18/2015   Procedure: COLONOSCOPY WITH PROPOFOL;  Surgeon: Manya Silvas, MD;  Location: [facility];  Service: Endoscopy;  Laterality: [name];   CORONARY ARTERY BYPASS GRAFT     CORONARY STENT INTERVENTION [name] 02/02/2020   Procedure: CORONARY STENT INTERVENTION;  Surgeon: Isaias Cowman, MD;  Location: [place] [facility];  Service: Cardiovascular;  Laterality: [name];   IRRIGATION AND DEBRIDEMENT FOOT Left 02/29/2020   Procedure: IRRIGATION AND DEBRIDEMENT FOOT;  Surgeon: Caroline More, DPM;  Location: [facility];  Service: Podiatry;  Laterality: Left;   IRRIGATION AND DEBRIDEMENT FOOT Left 02/24/2020   Procedure: IRRIGATION AND DEBRIDEMENT FOOT;  Surgeon: Caroline More, DPM;  Location: [facility];  Service: Podiatry;  Laterality: Left;   KNEE ARTHROSCOPY     LEFT HEART CATH AND CORS/GRAFTS ANGIOGRAPHY  [name] 02/02/2020   Procedure: LEFT HEART CATH AND CORS/GRAFTS ANGIOGRAPHY;  Surgeon: Teodoro Spray, MD;  Location: [place] [facility];  Service: Cardiovascular;  Laterality: [name];   LOWER EXTREMITY ANGIOGRAPHY Left 02/25/2020   Procedure: Lower Extremity Angiography;  Surgeon: Algernon Huxley, MD;  Location: [place] [facility];  Service: Cardiovascular;  Laterality: Left;   LOWER EXTREMITY ANGIOGRAPHY Left 01/04/2021   Procedure: LOWER EXTREMITY ANGIOGRAPHY;  Surgeon:  Algernon Huxley, MD;  Location: [place] [facility];  Service: Cardiovascular;  Laterality: Left;   METATARSAL HEAD EXCISION Left 01/10/2021   Procedure: METATARSAL HEAD EXCISION - LEFT 5th;  Surgeon: Caroline More, DPM;  Location: [facility];  Service: Podiatry;  Laterality: Left;   PERIPHERAL VASCULAR CATHETERIZATION Right 01/24/2016   Procedure: Lower Extremity Angiography;  Surgeon: Katha Cabal, MD;  Location: [place] [facility];  Service: Cardiovascular;  Laterality: Right;   PERIPHERAL VASCULAR CATHETERIZATION Right 01/25/2016   Procedure: Lower Extremity Angiography;  Surgeon: Katha Cabal, MD;  Location: [place] [facility];  Service: Cardiovascular;  Laterality: Right;   TOE AMPUTATION     TONSILLECTOMY      Medications Prior to Admission  Medication Sig Dispense Refill Last Dose   amiodarone (PACERONE) 200 MG tablet Take 1 tablet (200 mg total) by mouth daily.   05/08/2021 at 0830   apixaban (ELIQUIS) 5 MG TABS tablet Take 1 tablet (5 mg total) by mouth 2 (two) times daily. 60 tablet 5 05/08/2021 at 0830   digoxin 62.5 MCG TABS Take 0.0625 mg by mouth daily.   05/08/2021 at 0830   diltiazem (CARDIZEM CD) 300 MG 24 hr capsule Take 1 capsule (300 mg total) by mouth daily.   05/08/2021 at 0830   ezetimibe (ZETIA) 10 MG tablet Take 10 mg by mouth daily.   05/06/2021 at 2100   ferrous sulfate 325 (65 FE) MG tablet Take 325 mg by mouth daily with breakfast.   05/08/2021 at 0830   furosemide (LASIX) 20 MG tablet Take 40 mg by mouth daily.   05/08/2021 at 0830   gabapentin (NEURONTIN) 300 MG capsule Take 300 mg by mouth at bedtime.   05/06/2021 at 2100   insulin aspart (NOVOLOG) 100 UNIT/ML injection Inject 9 Units into the skin 3 (three) times daily with meals. (Patient taking differently: Inject 15 Units into the skin 3 (three) times daily with meals. SSI extra if BG high from 3 to 15 units) 10 mL 11 05/08/2021 at 0830   isosorbide mononitrate (IMDUR) 60 MG 24 hr tablet Take 60 mg by mouth 2 (two)  times daily.   05/08/2021 at [place] 100 UNIT/ML Solostar Pen Inject 50 Units into the skin 2 (two) times daily. (Patient taking differently: Inject 14 Units into the skin in the morning.) 15 mL 11 05/08/2021 at 0830   metFORMIN (GLUCOPHAGE) 1000 MG tablet Take 1,000 mg by mouth 2 (two) times daily.    05/08/2021 at 0830   metoprolol tartrate (LOPRESSOR) 50 MG tablet Take 50 mg by mouth 2 (two) times daily.   05/08/2021 at 0830   Multiple Vitamins-Minerals (MULTIVITAMIN WITH MINERALS) tablet Take 1 tablet by mouth daily.   05/08/2021 at 0830   nitroGLYCERIN (NITROSTAT) 0.4 MG SL tablet Place 0.4 mg under the tongue daily as needed.   prn at prn   potassium chloride (KLOR-CON) 10 MEQ tablet Take 10 mEq by mouth daily.   05/08/2021 at 0830   pramipexole (MIRAPEX) 1 MG tablet Take 2  mg by mouth at bedtime.   05/06/2021 at 2100   primidone (MYSOLINE) 250 MG tablet Take 250 mg by mouth 2 (two) times daily.   05/08/2021 at 0830   tamsulosin (FLOMAX) 0.4 MG CAPS capsule Take 0.4 mg by mouth daily.   05/08/2021 at 0830   vitamin B-12 (CYANOCOBALAMIN) 1000 MCG tablet Take 10,000 mcg by mouth daily. Energy shot   05/08/2021 at 0830   zinc gluconate 50 MG tablet Take 50 mg by mouth daily.   05/08/2021 at 0830   atorvastatin (LIPITOR) 40 MG tablet Take 1 tablet (40 mg total) by mouth daily. 30 tablet 0    metoprolol tartrate (LOPRESSOR) 25 MG tablet Take 1 tablet (25 mg total) by mouth 2 (two) times daily. (Patient not taking: Reported on 05/09/2021)   Not Taking   OZEMPIC, 0.25 OR 0.5 MG/DOSE, 2 MG/1.5ML SOPN Inject 0.25 mg into the skin once a week. (Patient not taking: Reported on 05/09/2021)   Not Taking    Social History   Socioeconomic History   Marital status: Married    Spouse name: Not on file   Number of children: Not on file   Years of education: Not on file   Highest education level: Not on file  Occupational History   Not on file  Tobacco Use   Smoking status: Former    Packs/day: 0.50     Years: 45.00    Pack years: 22.50    Types: Cigarettes    Quit date: 04/05/2015    Years since quitting: 6.1   Smokeless tobacco: Never  Vaping Use   Vaping Use: Every day  Substance and Sexual Activity   Alcohol use: Not Currently    Alcohol/week: 3.0 standard drinks    Types: 3 Glasses of wine per week   Drug use: No   Sexual activity: Not on file  Other Topics Concern   Not on file  Social History Narrative   Not on file   Social Determinants of Health   Financial Resource Strain: Not on file  Food Insecurity: Not on file  Transportation Needs: Not on file  Physical Activity: Not on file  Stress: Not on file  Social Connections: Not on file  Intimate Partner Violence: Not on file    History reviewed. No pertinent family history.    Review of systems complete and found to be negative unless listed above   PHYSICAL EXAM General: Pleasant Caucasian male, well nourished, in no acute distress. Examined sitting at incline in PCU bed.  HEENT:  Normocephalic and atraumatic. Neck:  No JVD.  Lungs: Normal respiratory effort on room air. Bibasilar crackles. No wheezes, rhonchi.  Heart: Tachycardic irregularly irregular rhythm. Normal S1 and S2 without gallops or murmurs. Radial pulse 2+ bilaterally. Abdomen: Obese appearing.  Msk: Normal strength and tone for age. Extremities: bilateral lower extremities wrapped in ACE bandages.  Neuro: Alert and oriented x3 Psych: Mood appropriate for situation.  Labs:   Lab Results  Component Value Date   WBC 7.0 05/10/2021   HGB 11.3 (L) 05/10/2021   HCT 37.0 (L) 05/10/2021   MCV 93.7 05/10/2021   PLT 281 05/10/2021    Recent Labs  Lab 05/08/21 1154 05/09/21 0720 05/10/21 0656  NA 139   < > 138  K 5.1   < > 4.3  CL 97*   < > 100  CO2 32   < > 31  BUN 20   < > 23  CREATININE 1.02   < >  1.14  CALCIUM 8.2*   < > 8.2*  PROT 7.3  --   --   BILITOT 0.8  --   --   ALKPHOS 197*  --   --   ALT 31  --   --   AST 27  --   --    GLUCOSE 209*   < > 192*   < > = values in this interval not displayed.    No results found for: CKTOTAL, CKMB, CKMBINDEX, TROPONINI No results found for: CHOL No results found for: HDL No results found for: LDLCALC No results found for: TRIG No results found for: CHOLHDL No results found for: LDLDIRECT    Radiology: DG Chest 2 View  Result Date: 05/08/2021 CLINICAL DATA:  Sepsis. EXAM: CHEST - 2 VIEW COMPARISON:  January 16, 2021. FINDINGS: Status post coronary bypass graft. Stable cardiomegaly with central pulmonary vascular congestion is noted. Mild bilateral pulmonary edema may be present. No consolidative process is noted. Bony thorax is unremarkable. IMPRESSION: Stable cardiomegaly with mild central pulmonary vascular congestion and probable mild bilateral pulmonary edema. Electronically Signed   By: Marijo Conception M.D.   On: 05/08/2021 12:31   US Venous Img Lower Bilateral  Result Date: 05/08/2021 CLINICAL DATA:  Pain and swelling EXAM: Bilateral LOWER EXTREMITY VENOUS DOPPLER ULTRASOUND TECHNIQUE: Gray-scale sonography with compression, as well as color and duplex ultrasound, were performed to evaluate the deep venous system(s) from the level of the common femoral vein through the popliteal and proximal calf veins. COMPARISON:  None. FINDINGS: VENOUS Normal compressibility of the common femoral, superficial femoral, and popliteal veins, as well as the visualized calf veins. Visualized portions of profunda femoral vein and great saphenous vein unremarkable. No filling defects to suggest DVT on grayscale or color Doppler imaging. Doppler waveforms show normal direction of venous flow, normal respiratory plasticity and response to augmentation. OTHER None. Limitations: Edema and habitus. IMPRESSION: Negative. Electronically Signed   By: Donavan Foil M.D.   On: 05/08/2021 21:19   DG Foot Complete Left  Result Date: 05/08/2021 CLINICAL DATA:  Bilateral foot wound EXAM: LEFT FOOT - COMPLETE  3+ VIEW COMPARISON:  01/10/2021 FINDINGS: No acute fracture is seen. Previous trans meta tarsal amputation third digit and partial amputation of fifth metatarsal. Cut margins appear smooth. No bony destructive change. No soft tissue emphysema. IMPRESSION: No acute osseous abnormality. Previous transmetatarsal amputation third digit and partial amputation of fifth metatarsal. Electronically Signed   By: Donavan Foil M.D.   On: 05/08/2021 23:06   DG Foot Complete Right  Result Date: 05/08/2021 CLINICAL DATA:  Foot wound EXAM: RIGHT FOOT COMPLETE - 3+ VIEW COMPARISON:  None. FINDINGS: No fracture is seen. Prior transmetatarsal amputation second digit and previous amputation of third digit at the level of the MTP joint. Smooth cut margins. Irregular arthropathy with erosive changes at the first IP joint of uncertain chronicity. There is soft tissue swelling. Negative for soft tissue emphysema. IMPRESSION: 1. Previous partial amputation of the second and third digits without acute findings here 2. Irregular arthropathy at the first IP joint with erosive changes of uncertain chronicity, correlate for signs of active infection here. Evaluation with MRI may be obtained as indicated. Electronically Signed   By: Donavan Foil M.D.   On: 05/08/2021 23:09   ECHOCARDIOGRAM COMPLETE  Result Date: 05/10/2021    ECHOCARDIOGRAM REPORT   Patient Name:   Fay Swider. Date of Exam: 05/10/2021 Medical Rec #:  [id number]  Height:       76.0 in Accession #:    [id number]            Weight:       350.0 lb Date of Birth:  1955/12/14            BSA:          2.810 m Patient Age:    60 years              BP:           114/65 mmHg Patient Gender: M                     HR:           118 bpm. Exam Location:  [facility] Procedure: 2D Echo, Cardiac Doppler and Color Doppler Indications:     CHF I50.9  History:         Patient has prior history of Echocardiogram examinations, most                  recent 01/04/2021. Previous  Myocardial Infarction; Risk                  Factors:Hypertension and Diabetes.  Sonographer:     [name] Sport Referring Phys:  [id number] [place] Jeneane Pieczynski Diagnosing Phys: Donnelly Angelica  Sonographer Comments: Technically challenging study due to limited acoustic windows, suboptimal parasternal window, no apical window and no subcostal window. IMPRESSIONS  1. Left ventricular ejection fraction, by estimation, is 50 to 55%. The left ventricle has low normal function. The left ventricle has no regional wall motion abnormalities. There is moderate left ventricular hypertrophy. Left ventricular diastolic function could not be evaluated.  2. Right ventricular systolic function was not well visualized. The right ventricular size is not well visualized.  3. The mitral valve is normal in structure. No evidence of mitral valve regurgitation.  4. The aortic valve is normal in structure. Aortic valve regurgitation is not visualized. Aortic valve sclerosis is present, with no evidence of aortic valve stenosis. FINDINGS  Left Ventricle: Left ventricular ejection fraction, by estimation, is 50 to 55%. The left ventricle has low normal function. The left ventricle has no regional wall motion abnormalities. The left ventricular internal cavity size was normal in size. There is moderate left ventricular hypertrophy. Left ventricular diastolic function could not be evaluated. Right Ventricle: The right ventricular size is not well visualized. Right vetricular wall thickness was not well visualized. Right ventricular systolic function was not well visualized. Left Atrium: Left atrial size was not well visualized. Right Atrium: Right atrial size was not well visualized. Pericardium: There is no evidence of pericardial effusion. Mitral Valve: The mitral valve is normal in structure. Mild mitral annular calcification. No evidence of mitral valve regurgitation. Tricuspid Valve: The tricuspid valve is normal in structure. Tricuspid valve  regurgitation is not demonstrated. Aortic Valve: The aortic valve is normal in structure. Aortic valve regurgitation is not visualized. Aortic valve sclerosis is present, with no evidence of aortic valve stenosis. Pulmonic Valve: The pulmonic valve was not well visualized. Pulmonic valve regurgitation is not visualized. No evidence of pulmonic stenosis. Aorta: The aortic root is normal in size and structure. Venous: The inferior vena cava was not well visualized. IAS/Shunts: The interatrial septum was not well visualized.  LEFT VENTRICLE PLAX 2D LVIDd:         5.80 cm LVIDs:         3.90 cm LV PW:  1.20 cm LV IVS:        1.60 cm LVOT diam:     2.00 cm LVOT Area:     3.14 cm  LEFT ATRIUM         Index LA diam:    5.10 cm 1.81 cm/m                        PULMONIC VALVE AORTA                 PV Vmax:        0.48 m/s Ao Root diam: 3.20 cm PV Vmean:       28.700 cm/s                       PV VTI:         0.057 m                       PV Peak grad:   0.9 mmHg                       PV Mean grad:   0.0 mmHg                       RVOT Peak grad: 1 mmHg   SHUNTS Systemic Diam: 2.00 cm Pulmonic VTI:  0.077 m Donnelly Angelica Electronically signed by Donnelly Angelica Signature Date/Time: 05/10/2021/9:11:30 AM    Final     ECHO LVEF 60 to 65% 12/2020  TELEMETRY reviewed by me: atrial flutter 119  EKG reviewed by me: atrial flutter with 2:1 conduction 123  ASSESSMENT AND PLAN:  65yoM with a PMH significant for CAD s/p CABG 2006 and PCI of RCA 01/2020, HFpEF (LVEF 60 to 65% 12/2020), HTN, BPH, diabetes type 2, class III obesity, PAD s/p left third toe amputation, chronic venous stasis on Unna boots,  A. fib on apixaban, amiodarone and digoxin, sleep apnea, chronic ulcer left foot, OSA on CPAP who presents to the emergency room for evaluation of worsening bilateral lower extremity edema associated with weeping of the extremities.  Cardiology was consulted for assistance with diuresis and his heart failure.  #Acute on chronic  HFpEF (LVEF 50-55% 05/10/2020) -clinically improving, off O2 today. S/p lasix 40mg  IV x2 and 60mg  IV x3. -ordered lasix 60mg  IV x1 for this morning and start PO lasix 60mg  once daily tomorrow -strongly encouraged fluid restrictions to 64 ounces per day, monitoring salt intake.   -Monitor I's and O's while inpatient.  #PAD s/p left third toe amputation #Chronic lower extremity wounds associated with venous stasis -Appreciate vascular surgery, podiatry, and wound care recommendations -on cefepime day 3 and vancomycin day 2. -completed ~24hrs of flagyl 1/11  #Paroxysmal Atrial fibrillation on eliquis CHA2DS2-VASc 5. -remains in atrial flutter with rate around ~118.  -stop amio gtt today and start PO amiodarone 200mg  BID today.  -added back metoprolol tartrate at 25mg  BID (home dose is 50mg  BID)  -Continue digoxin 0.0625 mg once daily, Cardizem CD 300 mg -hepatin gtt stopped 1/11 after wound debridement. Continue eliquis 5mg  BID.   #CAD s/p CABG 2006 and PCI of RCA 01/2020 #HTN #Hyperlipidemia -Continue Imdur -Continue Zetia -Would recommend high intensity statin therapy however patient has reported allergy of myalgias to statins -Plan as above  #type 2 diabetes Poorly controlled. A1c on admission 9.2, mgmt per primary team   This patient's plan  of care was discussed and created with Dr. Donnelly Angelica and he is in agreement.   Signed: Tristan Schroeder , PA-C 05/11/2021, 7:55 AM

## 2021-05-11 NOTE — Progress Notes (Signed)
PROGRESS NOTE    [name].  RDE:[id number] DOB: 1955-11-15 DOA: 05/08/2021 PCP: [name], MD     Brief Narrative:  Alec Less. is a 66 y.o. male with medical history significant for CAD s/p CABG 2006 and PCI of RCA 12/2020 and PCI, HTN, BPH, DM, class III obesity, PAD, A. fib on apixaban, amiodarone and digoxin, sleep apnea, chronic ulcer left foot, PAD s/p left first ray amputation, chronic venous stasis on Unna boots, chronic ulcer left foot, diastolic CHF last EF 60 to 65% 12/2020, OSA on CPAP who presents to the emergency room for evaluation of worsening bilateral lower extremity edema associated with weeping of the extremities as well as dyspnea on exertion.  Patient was hospitalized in September 2022 for cellulitis of chronic left foot ulcer and found to have streptococcal bacteremia at that time.  LLE angiogram showed no significant stenosis.  He is followed both by vascular surgery and podiatry.  New events last 24 hours / Subjective: Patient tolerated debridement well yesterday.  He remains in sinus tachycardia with rate in the 110s, has no complaints of chest pain, palpitation or worsening shortness of breath.  Urine output recorded 2300 mL yesterday.  Assessment & Plan:   Principal Problem:   Anasarca Active Problems:   Essential hypertension, benign   PAD (peripheral artery disease) (HCC)   Bilateral cellulitis of lower leg   CAD (coronary artery disease), autologous vein bypass graft   Hypersomnia with sleep apnea   Diabetes mellitus without complication (HCC)   Obesity, Class III, BMI 40-49.9 (morbid obesity) (HCC)   BPH (benign prostatic hyperplasia)   Chronic anticoagulation   Acute on chronic diastolic CHF (congestive heart failure) (HCC)   Foot ulcer with fat layer exposed, left (HCC)   Venous stasis of both lower extremities   AF (paroxysmal atrial fibrillation) (HCC)   CHF exacerbation (HCC)   Bilateral lower extremity cellulitis,  venous stasis ulcers, PAD -Appreciate vascular surgery and podiatry -Vascular surgery holding off on angiogram at this time -Status post bedside debridement by Dr. Luana Shu, podiatry, 1/11.  He will need to follow-up with wound care center.  Nonweightbearing to left heel, surgical shoe was ordered -Vancomycin, cefepime, Flagyl --> Augmentin -Aspirin   Staph hominis bacteremia -This is likely to be a contaminant as only seen 1 of 4 blood culture bottles -On antibiotics as above regardless  Acute on chronic diastolic HF  -Repeat echocardiogram showed EF 50 to 55% -Cardiology following -Continue IV Lasix --> PO   A. fib with RVR -Continue amiodarone, diltiazem, digoxin, metoprolol, Eliquis  Diabetes mellitus type 2, uncontrolled with hyperglycemia -A1c 9.2 -Continue Semglee, NovoLog, sliding scale insulin  CAD status post CABG -Continue aspirin, atorvastatin, Imdur  RLS -Continue pramipexole, primidone  BPH -Continue Flomax  OSA -CPAP nightly  DVT prophylaxis:  apixaban (ELIQUIS) tablet 5 mg  Code Status: Full code  Family Communication: None at bedside  Disposition Plan:  Status is: Inpatient  Remains inpatient appropriate because: Remains tachycardic     Consultants:  Cardiology Podiatry Vascular surgery   Procedures:  None   Antimicrobials:  Anti-infectives (From admission, onward)    Start     Dose/Rate Route Frequency Ordered Stop   05/10/21 2200  metroNIDAZOLE (FLAGYL) tablet 500 mg        500 mg Oral Every 12 hours 05/10/21 1238     05/09/21 1400  vancomycin (VANCOREADY) IVPB 1500 mg/300 mL        1,500 mg 150 mL/hr over  120 Minutes Intravenous Every 12 hours 05/09/21 0106 05/16/21 0159   05/09/21 1300  vancomycin (VANCOREADY) IVPB 1500 mg/300 mL  Status:  Discontinued        1,500 mg 150 mL/hr over 120 Minutes Intravenous Every 12 hours 05/09/21 0104 05/09/21 0106   05/09/21 0830  metroNIDAZOLE (FLAGYL) IVPB 500 mg  Status:  Discontinued         500 mg 100 mL/hr over 60 Minutes Intravenous Every 12 hours 05/09/21 0828 05/10/21 1238   05/09/21 0600  ceFEPIme (MAXIPIME) 2 g in sodium chloride 0.9 % 100 mL IVPB  Status:  Discontinued        2 g 200 mL/hr over 30 Minutes Intravenous Every 8 hours 05/09/21 0050 05/09/21 0105   05/09/21 0600  ceFEPIme (MAXIPIME) 2 g in sodium chloride 0.9 % 100 mL IVPB        2 g 200 mL/hr over 30 Minutes Intravenous Every 8 hours 05/09/21 0105 05/16/21 0559   05/09/21 0000  vancomycin (VANCOCIN) IVPB 1000 mg/200 mL premix       See Hyperspace for full Linked Orders Report.   1,000 mg 200 mL/hr over 60 Minutes Intravenous  Once 05/08/21 2303 05/09/21 0625   05/09/21 0000  vancomycin (VANCOREADY) IVPB 1500 mg/300 mL       See Hyperspace for full Linked Orders Report.   1,500 mg 150 mL/hr over 120 Minutes Intravenous  Once 05/08/21 2303 05/09/21 0257   05/08/21 2315  ceFEPIme (MAXIPIME) 2 g in sodium chloride 0.9 % 100 mL IVPB        2 g 200 mL/hr over 30 Minutes Intravenous  Once 05/08/21 2303 05/09/21 0030        Objective: Vitals:   05/10/21 2309 05/11/21 0444 05/11/21 0812 05/11/21 1143  BP: (!) 145/89 132/80 112/82 105/66  Pulse: (!) 117 (!) 116 (!) 117 (!) 119  Resp: 18 17 19 20   Temp: (!) 97.4 F (36.3 C) 97.6 F (36.4 C) 98.2 F (36.8 C) 98.2 F (36.8 C)  TempSrc:  Oral    SpO2: 97% 100% 96% 92%  Weight:  (!) 184.8 kg    Height:        Intake/Output Summary (Last 24 hours) at 05/11/2021 1149 Last data filed at 05/11/2021 1000 Gross per 24 hour  Intake 2821.12 ml  Output 2750 ml  Net 71.12 ml    Filed Weights   05/08/21 1140 05/11/21 0444  Weight: (!) 158.8 kg (!) 184.8 kg    Examination: General exam: Appears calm and comfortable  Respiratory system: Clear to auscultation. Respiratory effort normal.  On room air Cardiovascular system: S1 & S2 heard, tachycardic, regular rhythm.  Bilateral pedal edema. Gastrointestinal system: Abdomen is nondistended, soft and  nontender. Normal bowel sounds heard. Central nervous system: Alert and oriented. Non focal exam. Speech clear  Extremities: Bilateral lower extremities in Unna boots Psychiatry: Judgement and insight appear stable. Mood & affect appropriate.    Data Reviewed: I have personally reviewed following labs and imaging studies  CBC: Recent Labs  Lab 05/08/21 1154 05/10/21 0656  WBC 8.8 7.0  NEUTROABS 6.6  --   HGB 11.7* 11.3*  HCT 39.2 37.0*  MCV 95.6 93.7  PLT 289 664    Basic Metabolic Panel: Recent Labs  Lab 05/08/21 1154 05/09/21 0720 05/10/21 0656 05/11/21 0644  NA 139 136 138 137  K 5.1 4.3 4.3 4.0  CL 97* 96* 100 100  CO2 32 32 31 33*  GLUCOSE 209* 178* 192* 301*  BUN 20 20 23 20   CREATININE 1.02 1.15 1.14 1.01  CALCIUM 8.2* 8.4* 8.2* 8.0*  MG  --   --   --  2.0    GFR: Estimated Creatinine Clearance: 128.2 mL/min (by C-G formula based on SCr of 1.01 mg/dL). Liver Function Tests: Recent Labs  Lab 05/08/21 1154  AST 27  ALT 31  ALKPHOS 197*  BILITOT 0.8  PROT 7.3  ALBUMIN 2.9*    No results for input(s): LIPASE, AMYLASE in the last 168 hours. No results for input(s): AMMONIA in the last 168 hours. Coagulation Profile: Recent Labs  Lab 05/08/21 1154  INR 1.2    Cardiac Enzymes: No results for input(s): CKTOTAL, CKMB, CKMBINDEX, TROPONINI in the last 168 hours. BNP (last 3 results) No results for input(s): PROBNP in the last 8760 hours. HbA1C: Recent Labs    05/09/21 0720  HGBA1C 9.2*    CBG: Recent Labs  Lab 05/10/21 1143 05/10/21 1650 05/10/21 2134 05/11/21 0815 05/11/21 1141  GLUCAP 199* 291* 322* 305* 281*    Lipid Profile: No results for input(s): CHOL, HDL, LDLCALC, TRIG, CHOLHDL, LDLDIRECT in the last 72 hours. Thyroid Function Tests: No results for input(s): TSH, T4TOTAL, FREET4, T3FREE, THYROIDAB in the last 72 hours. Anemia Panel: No results for input(s): VITAMINB12, FOLATE, FERRITIN, TIBC, IRON, RETICCTPCT in the last  72 hours. Sepsis Labs: Recent Labs  Lab 05/08/21 1154 05/08/21 1258  LATICACIDVEN 2.3* 1.5     Recent Results (from the past 240 hour(s))  Culture, blood (Routine x 2)     Status: None (Preliminary result)   Collection Time: 05/08/21 11:54 AM   Specimen: BLOOD  Result Value Ref Range Status   Specimen Description BLOOD LEFT ANTECUBITAL  Final   Special Requests   Final    BOTTLES DRAWN AEROBIC AND ANAEROBIC Blood Culture adequate volume   Culture   Final    NO GROWTH 3 DAYS Performed at [facility], [address] [place], [place] [address]    Report Status PENDING  Incomplete  Culture, blood (Routine x 2)     Status: Abnormal   Collection Time: 05/08/21 12:56 PM   Specimen: BLOOD  Result Value Ref Range Status   Specimen Description   Final    BLOOD BLOOD LEFT FOREARM Performed at [facility], [address] [place], [place] [address]    Special Requests   Final    BOTTLES DRAWN AEROBIC AND ANAEROBIC Blood Culture adequate volume Performed at [facility], [address] [place], [place] [address]    Culture  Setup Time   Final    GRAM POSITIVE COCCI Organism ID to follow AEROBIC BOTTLE ONLY CRITICAL RESULT CALLED TO, READ BACK BY AND VERIFIED WITH: LISA KOUTZZ 05/09/21 0706 Performed at [facility], [address] [place], [place] [address]    Culture (A)  Final    STAPHYLOCOCCUS HOMINIS THE SIGNIFICANCE OF ISOLATING THIS ORGANISM FROM A SINGLE SET OF BLOOD CULTURES WHEN MULTIPLE SETS ARE DRAWN IS UNCERTAIN. PLEASE NOTIFY THE MICROBIOLOGY DEPARTMENT WITHIN ONE WEEK IF SPECIATION AND SENSITIVITIES ARE REQUIRED. Performed at [facility], [place] [address] [place], [place] [address]    Report Status 05/11/2021 FINAL  Final  Blood Culture ID Panel (Reflexed)     Status: Abnormal   Collection Time: 05/08/21 12:56 PM  Result Value Ref Range Status   Enterococcus faecalis NOT DETECTED NOT DETECTED Final   Enterococcus  Faecium NOT DETECTED NOT DETECTED Final   Listeria  monocytogenes NOT DETECTED NOT DETECTED Final   Staphylococcus species DETECTED (A) NOT DETECTED Final    Comment: CRITICAL RESULT CALLED TO, READ BACK BY AND VERIFIED WITH: LISA KOTZZ 05/09/21 0707 MW    Staphylococcus aureus (BCID) NOT DETECTED NOT DETECTED Final   Staphylococcus epidermidis NOT DETECTED NOT DETECTED Final   Staphylococcus lugdunensis NOT DETECTED NOT DETECTED Final   Streptococcus species NOT DETECTED NOT DETECTED Final   Streptococcus agalactiae NOT DETECTED NOT DETECTED Final   Streptococcus pneumoniae NOT DETECTED NOT DETECTED Final   Streptococcus pyogenes NOT DETECTED NOT DETECTED Final   A.calcoaceticus-baumannii NOT DETECTED NOT DETECTED Final   Bacteroides fragilis NOT DETECTED NOT DETECTED Final   Enterobacterales NOT DETECTED NOT DETECTED Final   Enterobacter cloacae complex NOT DETECTED NOT DETECTED Final   Escherichia coli NOT DETECTED NOT DETECTED Final   Klebsiella aerogenes NOT DETECTED NOT DETECTED Final   Klebsiella oxytoca NOT DETECTED NOT DETECTED Final   Klebsiella pneumoniae NOT DETECTED NOT DETECTED Final   Proteus species NOT DETECTED NOT DETECTED Final   Salmonella species NOT DETECTED NOT DETECTED Final   Serratia marcescens NOT DETECTED NOT DETECTED Final   Haemophilus influenzae NOT DETECTED NOT DETECTED Final   Neisseria meningitidis NOT DETECTED NOT DETECTED Final   Pseudomonas aeruginosa NOT DETECTED NOT DETECTED Final   Stenotrophomonas maltophilia NOT DETECTED NOT DETECTED Final   Candida albicans NOT DETECTED NOT DETECTED Final   Candida auris NOT DETECTED NOT DETECTED Final   Candida glabrata NOT DETECTED NOT DETECTED Final   Candida krusei NOT DETECTED NOT DETECTED Final   Candida parapsilosis NOT DETECTED NOT DETECTED Final   Candida tropicalis NOT DETECTED NOT DETECTED Final   Cryptococcus neoformans/gattii NOT DETECTED NOT DETECTED Final    Comment: Performed at [facility], [address] [place], [place] [address]  Resp Panel by RT-PCR (Flu A&B, Covid) Nasopharyngeal Swab     Status: None   Collection Time: 05/09/21  9:50 AM   Specimen: Nasopharyngeal Swab; Nasopharyngeal(NP) swabs in vial transport medium  Result Value Ref Range Status   SARS Coronavirus 2 by RT PCR NEGATIVE NEGATIVE Final    Comment: (NOTE) SARS-CoV-2 target nucleic acids are NOT DETECTED.  The SARS-CoV-2 RNA is generally detectable in upper respiratory specimens during the acute phase of infection. The lowest concentration of SARS-CoV-2 viral copies this assay can detect is 138 copies/mL. A negative result does not preclude SARS-Cov-2 infection and should not be used as the sole basis for treatment or other patient management decisions. A negative result may occur with  improper specimen collection/handling, submission of specimen other than nasopharyngeal swab, presence of viral mutation(s) within the areas targeted by this assay, and inadequate number of viral copies(<138 copies/mL). A negative result must be combined with clinical observations, patient history, and epidemiological information. The expected result is Negative.  Fact Sheet for Patients:  EntrepreneurPulse.com.au  Fact Sheet for Healthcare Providers:  IncredibleEmployment.be  This test is no t yet approved or cleared by the [place] FDA and  has been authorized for detection and/or diagnosis of SARS-CoV-2 by FDA under an Emergency Use Authorization (EUA). This EUA will remain  in effect (meaning this test can be used) for the duration of the COVID-19 declaration under Section 564(b)(1) of the Act, 21 U.S.C.section 360bbb-3(b)(1), unless the authorization is terminated  or revoked sooner.       Influenza A by PCR NEGATIVE NEGATIVE Final   Influenza B by PCR NEGATIVE NEGATIVE Final    Comment: (NOTE)  The Xpert Xpress SARS-CoV-2/FLU/RSV plus assay is  intended as an aid in the diagnosis of influenza from Nasopharyngeal swab specimens and should not be used as a sole basis for treatment. Nasal washings and aspirates are unacceptable for Xpert Xpress SARS-CoV-2/FLU/RSV testing.  Fact Sheet for Patients: EntrepreneurPulse.com.au  Fact Sheet for Healthcare Providers: IncredibleEmployment.be  This test is not yet approved or cleared by the [place] FDA and has been authorized for detection and/or diagnosis of SARS-CoV-2 by FDA under an Emergency Use Authorization (EUA). This EUA will remain in effect (meaning this test can be used) for the duration of the COVID-19 declaration under Section 564(b)(1) of the Act, 21 U.S.C. section 360bbb-3(b)(1), unless the authorization is terminated or revoked.  Performed at [facility], [address], [place], [place] [address]        Radiology Studies: ECHOCARDIOGRAM COMPLETE  Result Date: 05/10/2021    ECHOCARDIOGRAM REPORT   Patient Name:   Alec Guerrini. Date of Exam: 05/10/2021 Medical Rec #:  [id number]             Height:       76.0 in Accession #:    [id number]            Weight:       350.0 lb Date of Birth:  10-24-1955            BSA:          2.810 m Patient Age:    89 years              BP:           114/65 mmHg Patient Gender: M                     HR:           118 bpm. Exam Location:  [facility] Procedure: 2D Echo, Cardiac Doppler and Color Doppler Indications:     CHF I50.9  History:         Patient has prior history of Echocardiogram examinations, most                  recent 01/04/2021. Previous Myocardial Infarction; Risk                  Factors:Hypertension and Diabetes.  Sonographer:     [name] Sport Referring Phys:  [id number] Matthews TANG Diagnosing Phys: Donnelly Angelica  Sonographer Comments: Technically challenging study due to limited acoustic windows, suboptimal parasternal window, no apical window and no subcostal window.  IMPRESSIONS  1. Left ventricular ejection fraction, by estimation, is 50 to 55%. The left ventricle has low normal function. The left ventricle has no regional wall motion abnormalities. There is moderate left ventricular hypertrophy. Left ventricular diastolic function could not be evaluated.  2. Right ventricular systolic function was not well visualized. The right ventricular size is not well visualized.  3. The mitral valve is normal in structure. No evidence of mitral valve regurgitation.  4. The aortic valve is normal in structure. Aortic valve regurgitation is not visualized. Aortic valve sclerosis is present, with no evidence of aortic valve stenosis. FINDINGS  Left Ventricle: Left ventricular ejection fraction, by estimation, is 50 to 55%. The left ventricle has low normal function. The left ventricle has no regional wall motion abnormalities. The left ventricular internal cavity size was normal in size. There is moderate left ventricular hypertrophy. Left ventricular diastolic function could not be evaluated. Right Ventricle:  The right ventricular size is not well visualized. Right vetricular wall thickness was not well visualized. Right ventricular systolic function was not well visualized. Left Atrium: Left atrial size was not well visualized. Right Atrium: Right atrial size was not well visualized. Pericardium: There is no evidence of pericardial effusion. Mitral Valve: The mitral valve is normal in structure. Mild mitral annular calcification. No evidence of mitral valve regurgitation. Tricuspid Valve: The tricuspid valve is normal in structure. Tricuspid valve regurgitation is not demonstrated. Aortic Valve: The aortic valve is normal in structure. Aortic valve regurgitation is not visualized. Aortic valve sclerosis is present, with no evidence of aortic valve stenosis. Pulmonic Valve: The pulmonic valve was not well visualized. Pulmonic valve regurgitation is not visualized. No evidence of pulmonic  stenosis. Aorta: The aortic root is normal in size and structure. Venous: The inferior vena cava was not well visualized. IAS/Shunts: The interatrial septum was not well visualized.  LEFT VENTRICLE PLAX 2D LVIDd:         5.80 cm LVIDs:         3.90 cm LV PW:         1.20 cm LV IVS:        1.60 cm LVOT diam:     2.00 cm LVOT Area:     3.14 cm  LEFT ATRIUM         Index LA diam:    5.10 cm 1.81 cm/m                        PULMONIC VALVE AORTA                 PV Vmax:        0.48 m/s Ao Root diam: 3.20 cm PV Vmean:       28.700 cm/s                       PV VTI:         0.057 m                       PV Peak grad:   0.9 mmHg                       PV Mean grad:   0.0 mmHg                       RVOT Peak grad: 1 mmHg   SHUNTS Systemic Diam: 2.00 cm Pulmonic VTI:  0.077 m Donnelly Angelica Electronically signed by Donnelly Angelica Signature Date/Time: 05/10/2021/9:11:30 AM    Final       Scheduled Meds:  amiodarone  200 mg Oral BID   apixaban  5 mg Oral BID   aspirin EC  81 mg Oral Daily   atorvastatin  40 mg Oral Daily   collagenase   Topical Daily   digoxin  0.0625 mg Oral Daily   diltiazem  300 mg Oral Daily   ezetimibe  10 mg Oral Daily   furosemide  60 mg Intravenous Once   [START ON 05/12/2021] furosemide  60 mg Oral Daily   gabapentin  300 mg Oral Daily   triamcinolone cream   Topical Once per day on Mon Thu   And   hydrocerin  1 application Topical Once per day on Mon Thu   insulin aspart  0-20 Units Subcutaneous TID WC   insulin  aspart  0-5 Units Subcutaneous QHS   insulin aspart  6 Units Subcutaneous TID WC   insulin glargine-yfgn  55 Units Subcutaneous BID   isosorbide mononitrate  60 mg Oral BID   metoprolol tartrate  25 mg Oral BID   metroNIDAZOLE  500 mg Oral Q12H   pramipexole  2 mg Oral QHS   primidone  250 mg Oral BID   sodium chloride flush  3 mL Intravenous Q12H   tamsulosin  0.4 mg Oral Daily   Continuous Infusions:  sodium chloride 5 mL/hr at 05/11/21 0700   ceFEPime (MAXIPIME) IV  200 mL/hr at 05/11/21 0700   vancomycin 1,500 mg (05/11/21 0218)     LOS: 3 days      Time spent: 25 minutes   Dessa Phi, DO [facility] 05/11/2021, 11:49 AM   Available via Epic secure chat 7am-7pm After these hours, please refer to coverage provider listed on [email or web address]

## 2021-05-12 LAB — CBC
HCT: 33.5 % — ABNORMAL LOW (ref 39.0–52.0)
Hemoglobin: 10.3 g/dL — ABNORMAL LOW (ref 13.0–17.0)
MCH: 28.1 pg (ref 26.0–34.0)
MCHC: 30.7 g/dL (ref 30.0–36.0)
MCV: 91.3 fL (ref 80.0–100.0)
Platelets: 274 10*3/uL (ref 150–400)
RBC: 3.67 MIL/uL — ABNORMAL LOW (ref 4.22–5.81)
RDW: 15.1 % (ref 11.5–15.5)
WBC: 6.3 10*3/uL (ref 4.0–10.5)
nRBC: 0 % (ref 0.0–0.2)

## 2021-05-12 LAB — GLUCOSE, CAPILLARY
Glucose-Capillary: 89 mg/dL (ref 70–99)
Glucose-Capillary: 137 mg/dL — ABNORMAL HIGH (ref 70–99)
Glucose-Capillary: 217 mg/dL — ABNORMAL HIGH (ref 70–99)
Glucose-Capillary: 202 mg/dL — ABNORMAL HIGH (ref 70–99)

## 2021-05-12 LAB — BASIC METABOLIC PANEL
Anion gap: 5 (ref 5–15)
BUN: 22 mg/dL (ref 8–23)
CO2: 31 mmol/L (ref 22–32)
Calcium: 8 mg/dL — ABNORMAL LOW (ref 8.9–10.3)
Chloride: 100 mmol/L (ref 98–111)
Creatinine, Ser: 1.05 mg/dL (ref 0.61–1.24)
GFR, Estimated: >60 mL/min (ref >60)
Glucose, Bld: 114 mg/dL — ABNORMAL HIGH (ref 70–99)
Potassium: 3.9 mmol/L (ref 3.5–5.1)
Sodium: 136 mmol/L (ref 135–145)

## 2021-05-12 MED ORDER — METOPROLOL TARTRATE 50 MG PO TABS
50.0000 mg | ORAL_TABLET | Freq: Two times a day (BID) | ORAL | Status: DC
  Administered 2021-05-12 – 2021-05-16 (×8): 50 mg via ORAL
  Filled 2021-05-12 (×8): qty 1

## 2021-05-12 MED ORDER — METOPROLOL TARTRATE 25 MG PO TABS
25.0000 mg | ORAL_TABLET | Freq: Once | ORAL | Status: AC
  Administered 2021-05-12: 25 mg via ORAL
  Filled 2021-05-12: qty 1

## 2021-05-12 MED ORDER — FUROSEMIDE 10 MG/ML IJ SOLN
60.0000 mg | Freq: Two times a day (BID) | INTRAMUSCULAR | Status: AC
  Administered 2021-05-12 – 2021-05-13 (×3): 60 mg via INTRAVENOUS
  Filled 2021-05-12 (×3): qty 6

## 2021-05-12 NOTE — Progress Notes (Signed)
CARDIOLOGY CONSULT NOTE               Patient ID: [name]. MRN: [id number] DOB/AGE: 09-30-1955 66 y.o.  Admit date: 05/08/2021 Referring Physician Dr Damita Dunnings Primary Physician Dr. Doy Hutching Primary Cardiologist Dr. Bartholome Bill  Reason for Consultation heart failure  HPI: pt is a [phone] with a PMH significant for CAD s/p CABG 2006 and PCI of RCA 01/2020, HFpEF (LVEF 60 to 65% 12/2020),HTN, BPH, DM, class III obesity, PAD s/p left third toe amputation, chronic venous stasis on Unna boots,  A. fib on apixaban, amiodarone and digoxin, sleep apnea, chronic ulcer left foot, , OSA on CPAP who presents to the emergency room for evaluation of worsening bilateral lower extremity edema associated with weeping of the extremities.  He was hospitalized 12/2020 with a similar presentation of lower extremity cellulitis and had strep bacteremia at that time. Cardiology was consulted for assistance with diuresis and his heart failure.  Interval history: -reports feeling about the same as yesterday -HR a little better around ~113 with addition of metop tartrate 25mg  BID yesterday -continuing IV lasix 60mg  BID today -denies chest pain, palpitations, SOB  Review of systems complete and found to be negative unless listed above   Past Medical History:  Diagnosis Date   Asthma    Coronary artery disease    Depression    Diabetes mellitus without complication ([place])    Gout    History anabolic steroid use    Hyperlipidemia    Hypertension    Hypogonadism in male    Morbid obesity ([place])    Myocardial infarction ([place])    Peripheral vascular disease ([place])    Perirectal abscess    Pleurisy    Sleep apnea    CPAP at night, no oxygen   Varicella     Past Surgical History:  Procedure Laterality Date   ABDOMINAL AORTIC ANEURYSM REPAIR     ACHILLES TENDON SURGERY Left 01/10/2021   Procedure: ACHILLES LENGTHENING/KIDNER;  Surgeon: Caroline More, DPM;  Location: [facility];  Service: Podiatry;   Laterality: Left;   AMPUTATION TOE Right 02/10/2016   Procedure: AMPUTATION TOE 3RD TOE;  Surgeon: Samara Deist, DPM;  Location: [facility];  Service: Podiatry;  Laterality: Right;   AMPUTATION TOE Left 02/24/2020   Procedure: AMPUTATION TOE;  Surgeon: Caroline More, DPM;  Location: [facility];  Service: Podiatry;  Laterality: Left;   APPLICATION OF WOUND VAC Left 02/29/2020   Procedure: APPLICATION OF WOUND VAC;  Surgeon: Caroline More, DPM;  Location: [facility];  Service: Podiatry;  Laterality: Left;   COLONOSCOPY WITH PROPOFOL [name] 11/18/2015   Procedure: COLONOSCOPY WITH PROPOFOL;  Surgeon: Manya Silvas, MD;  Location: [facility];  Service: Endoscopy;  Laterality: [name];   CORONARY ARTERY BYPASS GRAFT     CORONARY STENT INTERVENTION [name] 02/02/2020   Procedure: CORONARY STENT INTERVENTION;  Surgeon: Isaias Cowman, MD;  Location: [place] [facility];  Service: Cardiovascular;  Laterality: [name];   IRRIGATION AND DEBRIDEMENT FOOT Left 02/29/2020   Procedure: IRRIGATION AND DEBRIDEMENT FOOT;  Surgeon: Caroline More, DPM;  Location: [facility];  Service: Podiatry;  Laterality: Left;   IRRIGATION AND DEBRIDEMENT FOOT Left 02/24/2020   Procedure: IRRIGATION AND DEBRIDEMENT FOOT;  Surgeon: Caroline More, DPM;  Location: [facility];  Service: Podiatry;  Laterality: Left;   KNEE ARTHROSCOPY     LEFT HEART CATH AND CORS/GRAFTS ANGIOGRAPHY [name] 02/02/2020   Procedure: LEFT HEART CATH AND CORS/GRAFTS ANGIOGRAPHY;  Surgeon: Teodoro Spray, MD;  Location: [place] [facility];  Service: Cardiovascular;  Laterality: [name];   LOWER EXTREMITY ANGIOGRAPHY Left 02/25/2020   Procedure: Lower Extremity Angiography;  Surgeon: Algernon Huxley, MD;  Location: [place] [facility];  Service: Cardiovascular;  Laterality: Left;   LOWER EXTREMITY ANGIOGRAPHY Left 01/04/2021   Procedure: LOWER EXTREMITY ANGIOGRAPHY;  Surgeon: Algernon Huxley, MD;  Location: [place] [facility];  Service: Cardiovascular;  Laterality: Left;    METATARSAL HEAD EXCISION Left 01/10/2021   Procedure: METATARSAL HEAD EXCISION - LEFT 5th;  Surgeon: Caroline More, DPM;  Location: [facility];  Service: Podiatry;  Laterality: Left;   PERIPHERAL VASCULAR CATHETERIZATION Right 01/24/2016   Procedure: Lower Extremity Angiography;  Surgeon: Katha Cabal, MD;  Location: [place] [facility];  Service: Cardiovascular;  Laterality: Right;   PERIPHERAL VASCULAR CATHETERIZATION Right 01/25/2016   Procedure: Lower Extremity Angiography;  Surgeon: Katha Cabal, MD;  Location: [place] [facility];  Service: Cardiovascular;  Laterality: Right;   TOE AMPUTATION     TONSILLECTOMY      Medications Prior to Admission  Medication Sig Dispense Refill Last Dose   amiodarone (PACERONE) 200 MG tablet Take 1 tablet (200 mg total) by mouth daily.   05/08/2021 at 0830   apixaban (ELIQUIS) 5 MG TABS tablet Take 1 tablet (5 mg total) by mouth 2 (two) times daily. 60 tablet 5 05/08/2021 at 0830   digoxin 62.5 MCG TABS Take 0.0625 mg by mouth daily.   05/08/2021 at 0830   diltiazem (CARDIZEM CD) 300 MG 24 hr capsule Take 1 capsule (300 mg total) by mouth daily.   05/08/2021 at 0830   ezetimibe (ZETIA) 10 MG tablet Take 10 mg by mouth daily.   05/06/2021 at 2100   ferrous sulfate 325 (65 FE) MG tablet Take 325 mg by mouth daily with breakfast.   05/08/2021 at 0830   furosemide (LASIX) 20 MG tablet Take 40 mg by mouth daily.   05/08/2021 at 0830   gabapentin (NEURONTIN) 300 MG capsule Take 300 mg by mouth at bedtime.   05/06/2021 at 2100   insulin aspart (NOVOLOG) 100 UNIT/ML injection Inject 9 Units into the skin 3 (three) times daily with meals. (Patient taking differently: Inject 15 Units into the skin 3 (three) times daily with meals. SSI extra if BG high from 3 to 15 units) 10 mL 11 05/08/2021 at 0830   isosorbide mononitrate (IMDUR) 60 MG 24 hr tablet Take 60 mg by mouth 2 (two) times daily.   05/08/2021 at [place] 100 UNIT/ML Solostar Pen Inject 50 Units into the  skin 2 (two) times daily. (Patient taking differently: Inject 14 Units into the skin in the morning.) 15 mL 11 05/08/2021 at 0830   metFORMIN (GLUCOPHAGE) 1000 MG tablet Take 1,000 mg by mouth 2 (two) times daily.    05/08/2021 at 0830   metoprolol tartrate (LOPRESSOR) 50 MG tablet Take 50 mg by mouth 2 (two) times daily.   05/08/2021 at 0830   Multiple Vitamins-Minerals (MULTIVITAMIN WITH MINERALS) tablet Take 1 tablet by mouth daily.   05/08/2021 at 0830   nitroGLYCERIN (NITROSTAT) 0.4 MG SL tablet Place 0.4 mg under the tongue daily as needed.   prn at prn   potassium chloride (KLOR-CON) 10 MEQ tablet Take 10 mEq by mouth daily.   05/08/2021 at 0830   pramipexole (MIRAPEX) 1 MG tablet Take 2 mg by mouth at bedtime.   05/06/2021 at 2100   primidone (MYSOLINE) 250 MG tablet Take  250 mg by mouth 2 (two) times daily.   05/08/2021 at 0830   tamsulosin (FLOMAX) 0.4 MG CAPS capsule Take 0.4 mg by mouth daily.   05/08/2021 at 0830   vitamin B-12 (CYANOCOBALAMIN) 1000 MCG tablet Take 10,000 mcg by mouth daily. Energy shot   05/08/2021 at 0830   zinc gluconate 50 MG tablet Take 50 mg by mouth daily.   05/08/2021 at 0830   atorvastatin (LIPITOR) 40 MG tablet Take 1 tablet (40 mg total) by mouth daily. 30 tablet 0    metoprolol tartrate (LOPRESSOR) 25 MG tablet Take 1 tablet (25 mg total) by mouth 2 (two) times daily. (Patient not taking: Reported on 05/09/2021)   Not Taking   OZEMPIC, 0.25 OR 0.5 MG/DOSE, 2 MG/1.5ML SOPN Inject 0.25 mg into the skin once a week. (Patient not taking: Reported on 05/09/2021)   Not Taking    Social History   Socioeconomic History   Marital status: Married    Spouse name: Not on file   Number of children: Not on file   Years of education: Not on file   Highest education level: Not on file  Occupational History   Not on file  Tobacco Use   Smoking status: Former    Packs/day: 0.50    Years: 45.00    Pack years: 22.50    Types: Cigarettes    Quit date: 04/05/2015    Years since  quitting: 6.1   Smokeless tobacco: Never  Vaping Use   Vaping Use: Every day  Substance and Sexual Activity   Alcohol use: Not Currently    Alcohol/week: 3.0 standard drinks    Types: 3 Glasses of wine per week   Drug use: No   Sexual activity: Not on file  Other Topics Concern   Not on file  Social History Narrative   Not on file   Social Determinants of Health   Financial Resource Strain: Not on file  Food Insecurity: Not on file  Transportation Needs: Not on file  Physical Activity: Not on file  Stress: Not on file  Social Connections: Not on file  Intimate Partner Violence: Not on file    History reviewed. No pertinent family history.    Review of systems complete and found to be negative unless listed above   PHYSICAL EXAM General: Pleasant Caucasian male, well nourished, in no acute distress. Examined laying flat in PCU bed.  HEENT:  Normocephalic and atraumatic. Neck:  No JVD.  Lungs: Normal respiratory effort on room air. no crackles. No wheezes, rhonchi.  Heart: Tachycardic irregularly irregular rhythm. Normal S1 and S2 without gallops or murmurs. Radial pulse 2+ bilaterally. Abdomen: Obese appearing.  Msk: Normal strength and tone for age. Extremities: bilateral lower extremities wrapped in ACE bandages.  Neuro: Alert and oriented x3 Psych: Mood appropriate for situation.  Labs:   Lab Results  Component Value Date   WBC 6.3 05/12/2021   HGB 10.3 (L) 05/12/2021   HCT 33.5 (L) 05/12/2021   MCV 91.3 05/12/2021   PLT 274 05/12/2021    Recent Labs  Lab 05/08/21 1154 05/09/21 0720 05/12/21 0625  NA 139   < > 136  K 5.1   < > 3.9  CL 97*   < > 100  CO2 32   < > 31  BUN 20   < > 22  CREATININE 1.02   < > 1.05  CALCIUM 8.2*   < > 8.0*  PROT 7.3  --   --  BILITOT 0.8  --   --   ALKPHOS 197*  --   --   ALT 31  --   --   AST 27  --   --   GLUCOSE 209*   < > 114*   < > = values in this interval not displayed.    No results found for: CKTOTAL,  CKMB, CKMBINDEX, TROPONINI No results found for: CHOL No results found for: HDL No results found for: LDLCALC No results found for: TRIG No results found for: CHOLHDL No results found for: LDLDIRECT    Radiology: DG Chest 2 View  Result Date: 05/08/2021 CLINICAL DATA:  Sepsis. EXAM: CHEST - 2 VIEW COMPARISON:  January 16, 2021. FINDINGS: Status post coronary bypass graft. Stable cardiomegaly with central pulmonary vascular congestion is noted. Mild bilateral pulmonary edema may be present. No consolidative process is noted. Bony thorax is unremarkable. IMPRESSION: Stable cardiomegaly with mild central pulmonary vascular congestion and probable mild bilateral pulmonary edema. Electronically Signed   By: Marijo Conception M.D.   On: 05/08/2021 12:31   US Venous Img Lower Bilateral  Result Date: 05/08/2021 CLINICAL DATA:  Pain and swelling EXAM: Bilateral LOWER EXTREMITY VENOUS DOPPLER ULTRASOUND TECHNIQUE: Gray-scale sonography with compression, as well as color and duplex ultrasound, were performed to evaluate the deep venous system(s) from the level of the common femoral vein through the popliteal and proximal calf veins. COMPARISON:  None. FINDINGS: VENOUS Normal compressibility of the common femoral, superficial femoral, and popliteal veins, as well as the visualized calf veins. Visualized portions of profunda femoral vein and great saphenous vein unremarkable. No filling defects to suggest DVT on grayscale or color Doppler imaging. Doppler waveforms show normal direction of venous flow, normal respiratory plasticity and response to augmentation. OTHER None. Limitations: Edema and habitus. IMPRESSION: Negative. Electronically Signed   By: Donavan Foil M.D.   On: 05/08/2021 21:19   DG Foot Complete Left  Result Date: 05/08/2021 CLINICAL DATA:  Bilateral foot wound EXAM: LEFT FOOT - COMPLETE 3+ VIEW COMPARISON:  01/10/2021 FINDINGS: No acute fracture is seen. Previous trans meta tarsal amputation  third digit and partial amputation of fifth metatarsal. Cut margins appear smooth. No bony destructive change. No soft tissue emphysema. IMPRESSION: No acute osseous abnormality. Previous transmetatarsal amputation third digit and partial amputation of fifth metatarsal. Electronically Signed   By: Donavan Foil M.D.   On: 05/08/2021 23:06   DG Foot Complete Right  Result Date: 05/08/2021 CLINICAL DATA:  Foot wound EXAM: RIGHT FOOT COMPLETE - 3+ VIEW COMPARISON:  None. FINDINGS: No fracture is seen. Prior transmetatarsal amputation second digit and previous amputation of third digit at the level of the MTP joint. Smooth cut margins. Irregular arthropathy with erosive changes at the first IP joint of uncertain chronicity. There is soft tissue swelling. Negative for soft tissue emphysema. IMPRESSION: 1. Previous partial amputation of the second and third digits without acute findings here 2. Irregular arthropathy at the first IP joint with erosive changes of uncertain chronicity, correlate for signs of active infection here. Evaluation with MRI may be obtained as indicated. Electronically Signed   By: Donavan Foil M.D.   On: 05/08/2021 23:09   ECHOCARDIOGRAM COMPLETE  Result Date: 05/10/2021    ECHOCARDIOGRAM REPORT   Patient Name:   [name]. Date of Exam: 05/10/2021 Medical Rec #:  [id number]             Height:       76.0 in Accession #:  [id number]            Weight:       350.0 lb Date of Birth:  15-Jan-1956            BSA:          2.810 m Patient Age:    17 years              BP:           114/65 mmHg Patient Gender: M                     HR:           118 bpm. Exam Location:  [facility] Procedure: 2D Echo, Cardiac Doppler and Color Doppler Indications:     CHF I50.9  History:         Patient has prior history of Echocardiogram examinations, most                  recent 01/04/2021. Previous Myocardial Infarction; Risk                  Factors:Hypertension and Diabetes.  Sonographer:     [name] Sport  Referring Phys:  [id number] [place] Marlaya Turck Diagnosing Phys: Donnelly Angelica  Sonographer Comments: Technically challenging study due to limited acoustic windows, suboptimal parasternal window, no apical window and no subcostal window. IMPRESSIONS  1. Left ventricular ejection fraction, by estimation, is 50 to 55%. The left ventricle has low normal function. The left ventricle has no regional wall motion abnormalities. There is moderate left ventricular hypertrophy. Left ventricular diastolic function could not be evaluated.  2. Right ventricular systolic function was not well visualized. The right ventricular size is not well visualized.  3. The mitral valve is normal in structure. No evidence of mitral valve regurgitation.  4. The aortic valve is normal in structure. Aortic valve regurgitation is not visualized. Aortic valve sclerosis is present, with no evidence of aortic valve stenosis. FINDINGS  Left Ventricle: Left ventricular ejection fraction, by estimation, is 50 to 55%. The left ventricle has low normal function. The left ventricle has no regional wall motion abnormalities. The left ventricular internal cavity size was normal in size. There is moderate left ventricular hypertrophy. Left ventricular diastolic function could not be evaluated. Right Ventricle: The right ventricular size is not well visualized. Right vetricular wall thickness was not well visualized. Right ventricular systolic function was not well visualized. Left Atrium: Left atrial size was not well visualized. Right Atrium: Right atrial size was not well visualized. Pericardium: There is no evidence of pericardial effusion. Mitral Valve: The mitral valve is normal in structure. Mild mitral annular calcification. No evidence of mitral valve regurgitation. Tricuspid Valve: The tricuspid valve is normal in structure. Tricuspid valve regurgitation is not demonstrated. Aortic Valve: The aortic valve is normal in structure. Aortic valve regurgitation  is not visualized. Aortic valve sclerosis is present, with no evidence of aortic valve stenosis. Pulmonic Valve: The pulmonic valve was not well visualized. Pulmonic valve regurgitation is not visualized. No evidence of pulmonic stenosis. Aorta: The aortic root is normal in size and structure. Venous: The inferior vena cava was not well visualized. IAS/Shunts: The interatrial septum was not well visualized.  LEFT VENTRICLE PLAX 2D LVIDd:         5.80 cm LVIDs:         3.90 cm LV PW:         1.20 cm LV IVS:  1.60 cm LVOT diam:     2.00 cm LVOT Area:     3.14 cm  LEFT ATRIUM         Index LA diam:    5.10 cm 1.81 cm/m                        PULMONIC VALVE AORTA                 PV Vmax:        0.48 m/s Ao Root diam: 3.20 cm PV Vmean:       28.700 cm/s                       PV VTI:         0.057 m                       PV Peak grad:   0.9 mmHg                       PV Mean grad:   0.0 mmHg                       RVOT Peak grad: 1 mmHg   SHUNTS Systemic Diam: 2.00 cm Pulmonic VTI:  0.077 m Donnelly Angelica Electronically signed by Donnelly Angelica Signature Date/Time: 05/10/2021/9:11:30 AM    Final     ECHO LVEF 60 to 65% 12/2020  TELEMETRY reviewed by me: atrial flutter 119  EKG reviewed by me: atrial flutter with 2:1 conduction 123  ASSESSMENT AND PLAN:  65yoM with a PMH significant for CAD s/p CABG 2006 and PCI of RCA 01/2020, HFpEF (LVEF 60 to 65% 12/2020), HTN, BPH, diabetes type 2, class III obesity, PAD s/p left third toe amputation, chronic venous stasis on Unna boots,  A. fib on apixaban, amiodarone and digoxin, sleep apnea, chronic ulcer left foot, OSA on CPAP who presents to the emergency room for evaluation of worsening bilateral lower extremity edema associated with weeping of the extremities.  Cardiology was consulted for assistance with diuresis and his heart failure.  #Acute on chronic HFpEF (LVEF 50-55% 05/10/2020) -clinically improving, off O2 since 1/12. S/p lasix 40mg  IV x2 and 60mg  IV  x3. -continue IV lasix 60mg  x2 today and one more IV dose in the morning and hopefully can transition back to PO 60mg  (home dose) thereafter.  -renal function stable.  -strongly reiterated fluid restrictions to 64 ounces per day, monitoring salt intake.  -Monitor I's and O's while inpatient.  #PAD s/p left third toe amputation and right toe amputations #Chronic lower extremity wounds associated with venous stasis -Appreciate vascular surgery, podiatry, and wound care recommendations -on cefepime day 3 and vancomycin day 2. -completed ~24hrs of flagyl 1/11  #Paroxysmal Atrial fibrillation on eliquis CHA2DS2-VASc 5. -remains in atrial flutter with rate around ~114. -continue PO amiodarone 200mg  BID today.  -increased metoprolol tartrate back to home dosing at 50mg  BID   -Continue digoxin 0.0625 mg once daily, Cardizem CD 300 mg -hepatin gtt stopped 1/11 after wound debridement. Continue eliquis 5mg  BID.   #CAD s/p CABG 2006 and PCI of RCA 01/2020 #HTN #Hyperlipidemia -Continue Imdur -Continue Zetia -Would recommend high intensity statin therapy however patient has reported allergy of myalgias to statins -Plan as above  #type 2 diabetes Poorly controlled. A1c on admission 9.2, mgmt per primary team   This patient's plan  of care was discussed and created with Dr. Donnelly Angelica and he is in agreement.   Signed: Tristan Schroeder , PA-C 05/12/2021, 1:11 PM

## 2021-05-12 NOTE — Progress Notes (Signed)
PODIATRY / FOOT AND ANKLE SURGERY PROGRESS NOTE  Requesting Physician: Judd Gaudier, MD  Reason for consult: Venous/lymphedema wounds bilateral lower extremities, left heel wound, right hallux wound, left second toe wound, cellulitis bilateral lower extremities  Chief Complaint: Wounds and swelling to both feet   HPI: [name]. is a 67 y.o. male who presents today resting in bed comfortably.  Patient has been going to the bathroom a lot as expected with use of Lasix and diuretics.  Patient is tolerated dressings well overall to bilateral lower extremities and notes that he is feeling much better today.  He has been trying to stay off of his left foot is much as possible but does not have the surgical shoe on today when examining.  Patient is sitting up resting bedside eating lunch.  PMHx:  Past Medical History:  Diagnosis Date   Asthma    Coronary artery disease    Depression    Diabetes mellitus without complication ([employer])    Gout    History anabolic steroid use    Hyperlipidemia    Hypertension    Hypogonadism in male    Morbid obesity ([place])    Myocardial infarction ([place])    Peripheral vascular disease ([place])    Perirectal abscess    Pleurisy    Sleep apnea    CPAP at night, no oxygen   Varicella     Surgical Hx:  Past Surgical History:  Procedure Laterality Date   ABDOMINAL AORTIC ANEURYSM REPAIR     ACHILLES TENDON SURGERY Left 01/10/2021   Procedure: ACHILLES LENGTHENING/KIDNER;  Surgeon: Caroline More, DPM;  Location: [facility];  Service: Podiatry;  Laterality: Left;   AMPUTATION TOE Right 02/10/2016   Procedure: AMPUTATION TOE 3RD TOE;  Surgeon: Samara Deist, DPM;  Location: [facility];  Service: Podiatry;  Laterality: Right;   AMPUTATION TOE Left 02/24/2020   Procedure: AMPUTATION TOE;  Surgeon: Caroline More, DPM;  Location: [facility];  Service: Podiatry;  Laterality: Left;   APPLICATION OF WOUND VAC Left 02/29/2020   Procedure: APPLICATION OF WOUND VAC;   Surgeon: Caroline More, DPM;  Location: [facility];  Service: Podiatry;  Laterality: Left;   COLONOSCOPY WITH PROPOFOL [name] 11/18/2015   Procedure: COLONOSCOPY WITH PROPOFOL;  Surgeon: Manya Silvas, MD;  Location: [facility];  Service: Endoscopy;  Laterality: [name];   CORONARY ARTERY BYPASS GRAFT     CORONARY STENT INTERVENTION [name] 02/02/2020   Procedure: CORONARY STENT INTERVENTION;  Surgeon: Isaias Cowman, MD;  Location: [place] [facility];  Service: Cardiovascular;  Laterality: [name];   IRRIGATION AND DEBRIDEMENT FOOT Left 02/29/2020   Procedure: IRRIGATION AND DEBRIDEMENT FOOT;  Surgeon: Caroline More, DPM;  Location: [facility];  Service: Podiatry;  Laterality: Left;   IRRIGATION AND DEBRIDEMENT FOOT Left 02/24/2020   Procedure: IRRIGATION AND DEBRIDEMENT FOOT;  Surgeon: Caroline More, DPM;  Location: [facility];  Service: Podiatry;  Laterality: Left;   KNEE ARTHROSCOPY     LEFT HEART CATH AND CORS/GRAFTS ANGIOGRAPHY [name] 02/02/2020   Procedure: LEFT HEART CATH AND CORS/GRAFTS ANGIOGRAPHY;  Surgeon: Teodoro Spray, MD;  Location: [place] [facility];  Service: Cardiovascular;  Laterality: [name];   LOWER EXTREMITY ANGIOGRAPHY Left 02/25/2020   Procedure: Lower Extremity Angiography;  Surgeon: Algernon Huxley, MD;  Location: [place] [facility];  Service: Cardiovascular;  Laterality: Left;   LOWER EXTREMITY ANGIOGRAPHY Left 01/04/2021   Procedure: LOWER EXTREMITY ANGIOGRAPHY;  Surgeon: Algernon Huxley, MD;  Location: [place] [facility];  Service:  Cardiovascular;  Laterality: Left;   METATARSAL HEAD EXCISION Left 01/10/2021   Procedure: METATARSAL HEAD EXCISION - LEFT 5th;  Surgeon: Caroline More, DPM;  Location: [facility];  Service: Podiatry;  Laterality: Left;   PERIPHERAL VASCULAR CATHETERIZATION Right 01/24/2016   Procedure: Lower Extremity Angiography;  Surgeon: Katha Cabal, MD;  Location: [place] [facility];  Service: Cardiovascular;  Laterality: Right;   PERIPHERAL VASCULAR  CATHETERIZATION Right 01/25/2016   Procedure: Lower Extremity Angiography;  Surgeon: Katha Cabal, MD;  Location: [place] [facility];  Service: Cardiovascular;  Laterality: Right;   TOE AMPUTATION     TONSILLECTOMY      FHx: History reviewed. No pertinent family history.  Social History:  reports that he quit smoking about 6 years ago. His smoking use included cigarettes. He has a 22.50 pack-year smoking history. He has never used smokeless tobacco. He reports that he does not currently use alcohol after a past usage of about 3.0 standard drinks per week. He reports that he does not use drugs.  Allergies:  Allergies  Allergen Reactions   Statins     Other reaction(s): Muscle Pain Causes legs to ache per pt   Medications Prior to Admission  Medication Sig Dispense Refill   amiodarone (PACERONE) 200 MG tablet Take 1 tablet (200 mg total) by mouth daily.     apixaban (ELIQUIS) 5 MG TABS tablet Take 1 tablet (5 mg total) by mouth 2 (two) times daily. 60 tablet 5   digoxin 62.5 MCG TABS Take 0.0625 mg by mouth daily.     diltiazem (CARDIZEM CD) 300 MG 24 hr capsule Take 1 capsule (300 mg total) by mouth daily.     ezetimibe (ZETIA) 10 MG tablet Take 10 mg by mouth daily.     ferrous sulfate 325 (65 FE) MG tablet Take 325 mg by mouth daily with breakfast.     furosemide (LASIX) 20 MG tablet Take 40 mg by mouth daily.     gabapentin (NEURONTIN) 300 MG capsule Take 300 mg by mouth at bedtime.     insulin aspart (NOVOLOG) 100 UNIT/ML injection Inject 9 Units into the skin 3 (three) times daily with meals. (Patient taking differently: Inject 15 Units into the skin 3 (three) times daily with meals. SSI extra if BG high from 3 to 15 units) 10 mL 11   isosorbide mononitrate (IMDUR) 60 MG 24 hr tablet Take 60 mg by mouth 2 (two) times daily.     LANTUS SOLOSTAR 100 UNIT/ML Solostar Pen Inject 50 Units into the skin 2 (two) times daily. (Patient taking differently: Inject 14 Units into the skin  in the morning.) 15 mL 11   metFORMIN (GLUCOPHAGE) 1000 MG tablet Take 1,000 mg by mouth 2 (two) times daily.      metoprolol tartrate (LOPRESSOR) 50 MG tablet Take 50 mg by mouth 2 (two) times daily.     Multiple Vitamins-Minerals (MULTIVITAMIN WITH MINERALS) tablet Take 1 tablet by mouth daily.     nitroGLYCERIN (NITROSTAT) 0.4 MG SL tablet Place 0.4 mg under the tongue daily as needed.     potassium chloride (KLOR-CON) 10 MEQ tablet Take 10 mEq by mouth daily.     pramipexole (MIRAPEX) 1 MG tablet Take 2 mg by mouth at bedtime.     primidone (MYSOLINE) 250 MG tablet Take 250 mg by mouth 2 (two) times daily.     tamsulosin (FLOMAX) 0.4 MG CAPS capsule Take 0.4 mg by mouth daily.     vitamin B-12 (  CYANOCOBALAMIN) 1000 MCG tablet Take 10,000 mcg by mouth daily. Energy shot     zinc gluconate 50 MG tablet Take 50 mg by mouth daily.     atorvastatin (LIPITOR) 40 MG tablet Take 1 tablet (40 mg total) by mouth daily. 30 tablet 0   metoprolol tartrate (LOPRESSOR) 25 MG tablet Take 1 tablet (25 mg total) by mouth 2 (two) times daily. (Patient not taking: Reported on 05/09/2021)     OZEMPIC, 0.25 OR 0.5 MG/DOSE, 2 MG/1.5ML SOPN Inject 0.25 mg into the skin once a week. (Patient not taking: Reported on 05/09/2021)       Physical Exam: General: Alert and oriented.  No apparent distress.  Vascular: DP/PT pulses difficult to palpate due to large amount of substantial swelling to bilateral lower extremities.  Bilateral lower extremity pitting edema present, moderate to severe, associated erythema present to bilateral lower extremities as well.  Neuro: Light touch sensation absent to bilateral lower extremities.  Derm: Dressings left clean, dry, and intact to bilateral lower extremities today.  Previous results shown below from patient's last examination.  Multiple venous/lymphedema wounds present to bilateral lower extremities with associated erythema and edema, serosanguineous drainage present from  these venous wounds, which all appear to be superficial to level of dermis.  Dry cracked skin noted to bilateral lower extremities from the lower knee to distal.      Subcutaneous ulceration present to the dorsal aspect of the right IPJ hallux which measures approximately 0.5 x 0.5 x 0.1 cm, no associated erythema or edema present.    Subcutaneous ulceration present to the dorsal aspect of the left second toe at the PIPJ which measures approximately 0.4 x 0.4 x 0.1 cm, no associated erythema or edema present, mild serosanguineous drainage.  Large plantar heel ulcer which measures 2 cm x 1.5 cm x 0.4 cm into the subcutaneous tissue, prior to debridement wound appeared to be 100% necrotic with a large amount of contamination present with foreign debris, mild associated erythema and edema, mild odor, once this was removed the wound bed appeared to be fibro-granular after debridement.  No bone exposed at this time.    MSK: Metatarsus adductus present.  Calcaneal gait/stance bilaterally.  Left and right partial third ray amputations.  Left fifth metatarsal head resection.  Results for orders placed or performed during the hospital encounter of 05/08/21 (from the past 48 hour(s))  APTT     Status: Abnormal   Collection Time: 05/10/21  2:48 PM  Result Value Ref Range   aPTT 49 (H) 24 - 36 seconds    Comment:        IF BASELINE aPTT IS ELEVATED, SUGGEST PATIENT RISK ASSESSMENT BE USED TO DETERMINE APPROPRIATE ANTICOAGULANT THERAPY. Performed at [facility], [address] [place], [place] [address]   CBG monitoring, ED     Status: Abnormal   Collection Time: 05/10/21  4:50 PM  Result Value Ref Range   Glucose-Capillary 291 (H) 70 - 99 mg/dL    Comment: Glucose reference range applies only to samples taken after fasting for at least 8 hours.  Glucose, capillary     Status: Abnormal   Collection Time: 05/10/21  9:34 PM  Result Value Ref Range   Glucose-Capillary 322 (H) 70 - 99  mg/dL    Comment: Glucose reference range applies only to samples taken after fasting for at least 8 hours.  Basic metabolic panel     Status: Abnormal   Collection Time: 05/11/21  6:44 AM  Result Value Ref Range   Sodium 137 135 - 145 mmol/L   Potassium 4.0 3.5 - 5.1 mmol/L   Chloride 100 98 - 111 mmol/L   CO2 33 (H) 22 - 32 mmol/L   Glucose, Bld 301 (H) 70 - 99 mg/dL    Comment: Glucose reference range applies only to samples taken after fasting for at least 8 hours.   BUN 20 8 - 23 mg/dL   Creatinine, Ser 1.01 0.61 - 1.24 mg/dL   Calcium 8.0 (L) 8.9 - 10.3 mg/dL   GFR, Estimated >60 >60 mL/min    Comment: (NOTE) Calculated using the CKD-EPI Creatinine Equation (2021)    Anion gap 4 (L) 5 - 15    Comment: Performed at [facility], [address] [place], [place] [address]  Magnesium     Status: None   Collection Time: 05/11/21  6:44 AM  Result Value Ref Range   Magnesium 2.0 1.7 - 2.4 mg/dL    Comment: Performed at [facility], [address] [place], [place] [address]  Glucose, capillary     Status: Abnormal   Collection Time: 05/11/21  8:15 AM  Result Value Ref Range   Glucose-Capillary 305 (H) 70 - 99 mg/dL    Comment: Glucose reference range applies only to samples taken after fasting for at least 8 hours.  Glucose, capillary     Status: Abnormal   Collection Time: 05/11/21 11:41 AM  Result Value Ref Range   Glucose-Capillary 281 (H) 70 - 99 mg/dL    Comment: Glucose reference range applies only to samples taken after fasting for at least 8 hours.  Glucose, capillary     Status: Abnormal   Collection Time: 05/11/21  4:57 PM  Result Value Ref Range   Glucose-Capillary 253 (H) 70 - 99 mg/dL    Comment: Glucose reference range applies only to samples taken after fasting for at least 8 hours.  Glucose, capillary     Status: Abnormal   Collection Time: 05/11/21 10:05 PM  Result Value Ref Range   Glucose-Capillary 340 (H) 70 - 99 mg/dL    Comment:  Glucose reference range applies only to samples taken after fasting for at least 8 hours.  Basic metabolic panel     Status: Abnormal   Collection Time: 05/12/21  6:25 AM  Result Value Ref Range   Sodium 136 135 - 145 mmol/L   Potassium 3.9 3.5 - 5.1 mmol/L   Chloride 100 98 - 111 mmol/L   CO2 31 22 - 32 mmol/L   Glucose, Bld 114 (H) 70 - 99 mg/dL    Comment: Glucose reference range applies only to samples taken after fasting for at least 8 hours.   BUN 22 8 - 23 mg/dL   Creatinine, Ser 1.05 0.61 - 1.24 mg/dL   Calcium 8.0 (L) 8.9 - 10.3 mg/dL   GFR, Estimated >60 >60 mL/min    Comment: (NOTE) Calculated using the CKD-EPI Creatinine Equation (2021)    Anion gap 5 5 - 15    Comment: Performed at [facility], [address] [place], [place] [address]  CBC     Status: Abnormal   Collection Time: 05/12/21  6:25 AM  Result Value Ref Range   WBC 6.3 4.0 - 10.5 K/uL   RBC 3.67 (L) 4.22 - 5.81 MIL/uL   Hemoglobin 10.3 (L) 13.0 - 17.0 g/dL   HCT 33.5 (L) 39.0 - 52.0 %   MCV 91.3 80.0 - 100.0 fL  MCH 28.1 26.0 - 34.0 pg   MCHC 30.7 30.0 - 36.0 g/dL   RDW 15.1 11.5 - 15.5 %   Platelets 274 150 - 400 K/uL   nRBC 0.0 0.0 - 0.2 %    Comment: Performed at [facility], [address] [place], [place] [address]  Glucose, capillary     Status: None   Collection Time: 05/12/21  8:13 AM  Result Value Ref Range   Glucose-Capillary 89 70 - 99 mg/dL    Comment: Glucose reference range applies only to samples taken after fasting for at least 8 hours.  Glucose, capillary     Status: Abnormal   Collection Time: 05/12/21 11:45 AM  Result Value Ref Range   Glucose-Capillary 137 (H) 70 - 99 mg/dL    Comment: Glucose reference range applies only to samples taken after fasting for at least 8 hours.   No results found.  Blood pressure 117/69, pulse (!) 108, temperature 98.3 F (36.8 C), temperature source Oral, resp. rate 18, height 6\' 4"  (1.93 m), weight (!) 185.5 kg, SpO2 92  %.  Assessment Bilateral lower extremity venous/lymphedema ulcerations with associated cellulitis Left plantar heel wound, subcutaneous neuropathic ulcer Right IPJ hallux dorsal and PIPJ left second toe dorsal neuropathic ulcerations Diabetes type 2 with polyneuropathy, uncontrolled PVD  Plan -Patient seen and examined. -Appreciate wound care recommendations.  Recommend Unna boot changes for the next week twice weekly and then after that likely can be changed once a week.  Recommend applying silver alginate to the venous wounds to bilateral lower extremities.  Leave the left heel exposed for changes with Santyl daily and apply 4 x 4 gauze, ABD, Kerlix, Coban with compression. -Due to the substantial nature and numerous quantity of wounds he will likely have to follow-up with the wound care center.  Follow-up instructions placed in chart. -Patient needs to be nonweightbearing to the left heel at all times.  Forefoot wedge shoe obtained.  Patient can be weightbearing for short distances with all pressure on the forefoot but should not put any pressure on the heel.  Stressed importance. -Discussed with patient and significant other that patient is at high risk for limb loss due to multiple wounds that are present, history of PVD as well as uncontrolled diabetes, substantial swelling and cellulitis present.  Patient appears to have a calcaneal gait bilaterally as well based on examination today left worse than right which is likely gotten worse with patient's most recent weight gain in which patient weighs now around 400 pounds.  Podiatry team to sign off at this time.  Discharge instructions placed in chart.  Caroline More, DPM 05/12/2021, 12:21 PM

## 2021-05-12 NOTE — Progress Notes (Addendum)
PROGRESS NOTE    [name].  YSA:[id number] DOB: 1955-12-08 DOA: 05/08/2021 PCP: [name], MD     Brief Narrative:  [name]. is a 66 y.o. male with medical history significant for CAD s/p CABG 2006 and PCI of RCA 12/2020 and PCI, HTN, BPH, DM, class III obesity, PAD, A. fib on apixaban, amiodarone and digoxin, sleep apnea, chronic ulcer left foot, PAD s/p left first ray amputation, chronic venous stasis on Unna boots, chronic ulcer left foot, diastolic CHF last EF 60 to 65% 12/2020, OSA on CPAP who presents to the emergency room for evaluation of worsening bilateral lower extremity edema associated with weeping of the extremities as well as dyspnea on exertion.  Patient was hospitalized in September 2022 for cellulitis of chronic left foot ulcer and found to have streptococcal bacteremia at that time.  LLE angiogram showed no significant stenosis.  He is followed both by vascular surgery and podiatry.  New events last 24 hours / Subjective: Doing well, no new complaints today. Denies chest pain, SOB. Continues to have LE edema. UOP recorded 2052ml.   Assessment & Plan:   Principal Problem:   Anasarca Active Problems:   Essential hypertension, benign   PAD (peripheral artery disease) (HCC)   Bilateral cellulitis of lower leg   CAD (coronary artery disease), autologous vein bypass graft   Hypersomnia with sleep apnea   Diabetes mellitus without complication (HCC)   Obesity, Class III, BMI 40-49.9 (morbid obesity) (HCC)   BPH (benign prostatic hyperplasia)   Chronic anticoagulation   Acute on chronic diastolic CHF (congestive heart failure) (HCC)   Foot ulcer with fat layer exposed, left (HCC)   Venous stasis of both lower extremities   AF (paroxysmal atrial fibrillation) (HCC)   CHF exacerbation (HCC)   Bilateral lower extremity cellulitis, venous stasis ulcers, PAD -Appreciate vascular surgery and podiatry -Vascular surgery holding off on angiogram at  this time -Status post bedside debridement by Dr. Luana Shu, podiatry, 1/11.  He will need to follow-up with wound care center.  Nonweightbearing to left heel, surgical shoe was ordered -Vancomycin, cefepime, Flagyl --> Augmentin for 7 days  -Aspirin   Staph hominis bacteremia -This is likely to be a contaminant as only seen 1 of 4 blood culture bottles -On antibiotics as above regardless  Acute on chronic diastolic HF  -Repeat echocardiogram showed EF 50 to 55% -Cardiology following -Back on IV Lasix -Remains volume overloaded   A. fib with RVR -Continue amiodarone, diltiazem, digoxin, metoprolol, Eliquis  Diabetes mellitus type 2, uncontrolled with hyperglycemia -A1c 9.2 -Continue Semglee, NovoLog, sliding scale insulin  CAD status post CABG -Continue aspirin, atorvastatin, Imdur  RLS -Continue pramipexole, primidone  BPH -Continue Flomax  OSA -CPAP nightly  DVT prophylaxis:  apixaban (ELIQUIS) tablet 5 mg  Code Status: Full code  Family Communication: None at bedside  Disposition Plan:  Status is: Inpatient  Remains inpatient appropriate because: Remains tachycardic, edematous      Consultants:  Cardiology Podiatry Vascular surgery   Procedures:  None   Antimicrobials:  Anti-infectives (From admission, onward)    Start     Dose/Rate Route Frequency Ordered Stop   05/11/21 1330  amoxicillin-clavulanate (AUGMENTIN) 875-125 MG per tablet 1 tablet        1 tablet Oral 2 times daily 05/11/21 1241 05/18/21 0959   05/10/21 2200  metroNIDAZOLE (FLAGYL) tablet 500 mg  Status:  Discontinued        500 mg Oral Every 12 hours 05/10/21  1238 05/11/21 1241   05/09/21 1400  vancomycin (VANCOREADY) IVPB 1500 mg/300 mL  Status:  Discontinued        1,500 mg 150 mL/hr over 120 Minutes Intravenous Every 12 hours 05/09/21 0106 05/11/21 1241   05/09/21 1300  vancomycin (VANCOREADY) IVPB 1500 mg/300 mL  Status:  Discontinued        1,500 mg 150 mL/hr over 120 Minutes  Intravenous Every 12 hours 05/09/21 0104 05/09/21 0106   05/09/21 0830  metroNIDAZOLE (FLAGYL) IVPB 500 mg  Status:  Discontinued        500 mg 100 mL/hr over 60 Minutes Intravenous Every 12 hours 05/09/21 0828 05/10/21 1238   05/09/21 0600  ceFEPIme (MAXIPIME) 2 g in sodium chloride 0.9 % 100 mL IVPB  Status:  Discontinued        2 g 200 mL/hr over 30 Minutes Intravenous Every 8 hours 05/09/21 0050 05/09/21 0105   05/09/21 0600  ceFEPIme (MAXIPIME) 2 g in sodium chloride 0.9 % 100 mL IVPB  Status:  Discontinued        2 g 200 mL/hr over 30 Minutes Intravenous Every 8 hours 05/09/21 0105 05/11/21 1241   05/09/21 0000  vancomycin (VANCOCIN) IVPB 1000 mg/200 mL premix       See Hyperspace for full Linked Orders Report.   1,000 mg 200 mL/hr over 60 Minutes Intravenous  Once 05/08/21 2303 05/09/21 0625   05/09/21 0000  vancomycin (VANCOREADY) IVPB 1500 mg/300 mL       See Hyperspace for full Linked Orders Report.   1,500 mg 150 mL/hr over 120 Minutes Intravenous  Once 05/08/21 2303 05/09/21 0257   05/08/21 2315  ceFEPIme (MAXIPIME) 2 g in sodium chloride 0.9 % 100 mL IVPB        2 g 200 mL/hr over 30 Minutes Intravenous  Once 05/08/21 2303 05/09/21 0030        Objective: Vitals:   05/12/21 0037 05/12/21 0428 05/12/21 0812 05/12/21 1144  BP: 93/60 110/76 110/67 117/69  Pulse: (!) 112 (!) 113 (!) 110 (!) 108  Resp:   16 18  Temp: 98.3 F (36.8 C) 97.7 F (36.5 C) 98 F (36.7 C) 98.3 F (36.8 C)  TempSrc: Oral Oral Oral Oral  SpO2: 94% 99% 94% 92%  Weight:  (!) 185.5 kg    Height:        Intake/Output Summary (Last 24 hours) at 05/12/2021 1206 Last data filed at 05/12/2021 0900 Gross per 24 hour  Intake 1176.61 ml  Output 1551 ml  Net -374.39 ml    Filed Weights   05/08/21 1140 05/11/21 0444 05/12/21 0428  Weight: (!) 158.8 kg (!) 184.8 kg (!) 185.5 kg    Examination: General exam: Appears calm and comfortable  Respiratory system: Clear to auscultation. Respiratory  effort normal.  On room air Cardiovascular system: S1 & S2 heard, tachycardic, regular rhythm.  +Bilateral pedal edema. Gastrointestinal system: Abdomen is nondistended, soft and nontender. Normal bowel sounds heard. Central nervous system: Alert and oriented. Non focal exam. Speech clear  Extremities: Bilateral lower extremities in Unna boots Psychiatry: Judgement and insight appear stable. Mood & affect appropriate.    Data Reviewed: I have personally reviewed following labs and imaging studies  CBC: Recent Labs  Lab 05/08/21 1154 05/10/21 0656 05/12/21 0625  WBC 8.8 7.0 6.3  NEUTROABS 6.6  --   --   HGB 11.7* 11.3* 10.3*  HCT 39.2 37.0* 33.5*  MCV 95.6 93.7 91.3  PLT 289 281 274  Basic Metabolic Panel: Recent Labs  Lab 05/08/21 1154 05/09/21 0720 05/10/21 0656 05/11/21 0644 05/12/21 0625  NA 139 136 138 137 136  K 5.1 4.3 4.3 4.0 3.9  CL 97* 96* 100 100 100  CO2 32 32 31 33* 31  GLUCOSE 209* 178* 192* 301* 114*  BUN 20 20 23 20 22   CREATININE 1.02 1.15 1.14 1.01 1.05  CALCIUM 8.2* 8.4* 8.2* 8.0* 8.0*  MG  --   --   --  2.0  --     GFR: Estimated Creatinine Clearance: 123.6 mL/min (by C-G formula based on SCr of 1.05 mg/dL). Liver Function Tests: Recent Labs  Lab 05/08/21 1154  AST 27  ALT 31  ALKPHOS 197*  BILITOT 0.8  PROT 7.3  ALBUMIN 2.9*    No results for input(s): LIPASE, AMYLASE in the last 168 hours. No results for input(s): AMMONIA in the last 168 hours. Coagulation Profile: Recent Labs  Lab 05/08/21 1154  INR 1.2    Cardiac Enzymes: No results for input(s): CKTOTAL, CKMB, CKMBINDEX, TROPONINI in the last 168 hours. BNP (last 3 results) No results for input(s): PROBNP in the last 8760 hours. HbA1C: No results for input(s): HGBA1C in the last 72 hours.  CBG: Recent Labs  Lab 05/11/21 1141 05/11/21 1657 05/11/21 2205 05/12/21 0813 05/12/21 1145  GLUCAP 281* 253* 340* 89 137*    Lipid Profile: No results for input(s):  CHOL, HDL, LDLCALC, TRIG, CHOLHDL, LDLDIRECT in the last 72 hours. Thyroid Function Tests: No results for input(s): TSH, T4TOTAL, FREET4, T3FREE, THYROIDAB in the last 72 hours. Anemia Panel: No results for input(s): VITAMINB12, FOLATE, FERRITIN, TIBC, IRON, RETICCTPCT in the last 72 hours. Sepsis Labs: Recent Labs  Lab 05/08/21 1154 05/08/21 1258  LATICACIDVEN 2.3* 1.5     Recent Results (from the past 240 hour(s))  Culture, blood (Routine x 2)     Status: None (Preliminary result)   Collection Time: 05/08/21 11:54 AM   Specimen: BLOOD  Result Value Ref Range Status   Specimen Description BLOOD LEFT ANTECUBITAL  Final   Special Requests   Final    BOTTLES DRAWN AEROBIC AND ANAEROBIC Blood Culture adequate volume   Culture   Final    NO GROWTH 4 DAYS Performed at [facility], [address] [place], [place] [address]    Report Status PENDING  Incomplete  Culture, blood (Routine x 2)     Status: Abnormal   Collection Time: 05/08/21 12:56 PM   Specimen: BLOOD  Result Value Ref Range Status   Specimen Description   Final    BLOOD BLOOD LEFT FOREARM Performed at [facility], [address], [place], [place] [address]    Special Requests   Final    BOTTLES DRAWN AEROBIC AND ANAEROBIC Blood Culture adequate volume Performed at [facility], [address] [place], [place] [address]    Culture  Setup Time   Final    GRAM POSITIVE COCCI Organism ID to follow AEROBIC BOTTLE ONLY CRITICAL RESULT CALLED TO, READ BACK BY AND VERIFIED WITH: LISA KOUTZZ 05/09/21 0706 Performed at [facility], [address] [place], [place] [address]    Culture (A)  Final    STAPHYLOCOCCUS HOMINIS THE SIGNIFICANCE OF ISOLATING THIS ORGANISM FROM A SINGLE SET OF BLOOD CULTURES WHEN MULTIPLE SETS ARE DRAWN IS UNCERTAIN. PLEASE NOTIFY THE MICROBIOLOGY DEPARTMENT WITHIN ONE WEEK IF SPECIATION AND SENSITIVITIES ARE REQUIRED. Performed at [facility], [place],  [place] [address]    Report Status 05/11/2021 FINAL  Final  Blood Culture ID Panel (Reflexed)     Status: Abnormal   Collection Time: 05/08/21 12:56 PM  Result Value Ref Range Status   Enterococcus faecalis NOT DETECTED NOT DETECTED Final   Enterococcus Faecium NOT DETECTED NOT DETECTED Final   Listeria monocytogenes NOT DETECTED NOT DETECTED Final   Staphylococcus species DETECTED (A) NOT DETECTED Final    Comment: CRITICAL RESULT CALLED TO, READ BACK BY AND VERIFIED WITH: LISA KOTZZ 05/09/21 0707 MW    Staphylococcus aureus (BCID) NOT DETECTED NOT DETECTED Final   Staphylococcus epidermidis NOT DETECTED NOT DETECTED Final   Staphylococcus lugdunensis NOT DETECTED NOT DETECTED Final   Streptococcus species NOT DETECTED NOT DETECTED Final   Streptococcus agalactiae NOT DETECTED NOT DETECTED Final   Streptococcus pneumoniae NOT DETECTED NOT DETECTED Final   Streptococcus pyogenes NOT DETECTED NOT DETECTED Final   A.calcoaceticus-baumannii NOT DETECTED NOT DETECTED Final   Bacteroides fragilis NOT DETECTED NOT DETECTED Final   Enterobacterales NOT DETECTED NOT DETECTED Final   Enterobacter cloacae complex NOT DETECTED NOT DETECTED Final   Escherichia coli NOT DETECTED NOT DETECTED Final   Klebsiella aerogenes NOT DETECTED NOT DETECTED Final   Klebsiella oxytoca NOT DETECTED NOT DETECTED Final   Klebsiella pneumoniae NOT DETECTED NOT DETECTED Final   Proteus species NOT DETECTED NOT DETECTED Final   Salmonella species NOT DETECTED NOT DETECTED Final   Serratia marcescens NOT DETECTED NOT DETECTED Final   Haemophilus influenzae NOT DETECTED NOT DETECTED Final   Neisseria meningitidis NOT DETECTED NOT DETECTED Final   Pseudomonas aeruginosa NOT DETECTED NOT DETECTED Final   Stenotrophomonas maltophilia NOT DETECTED NOT DETECTED Final   Candida albicans NOT DETECTED NOT DETECTED Final   Candida auris NOT DETECTED NOT DETECTED Final   Candida glabrata NOT  DETECTED NOT DETECTED Final   Candida krusei NOT DETECTED NOT DETECTED Final   Candida parapsilosis NOT DETECTED NOT DETECTED Final   Candida tropicalis NOT DETECTED NOT DETECTED Final   Cryptococcus neoformans/gattii NOT DETECTED NOT DETECTED Final    Comment: Performed at [facility], [address] [place], [place] [address]  Resp Panel by RT-PCR (Flu A&B, Covid) Nasopharyngeal Swab     Status: None   Collection Time: 05/09/21  9:50 AM   Specimen: Nasopharyngeal Swab; Nasopharyngeal(NP) swabs in vial transport medium  Result Value Ref Range Status   SARS Coronavirus 2 by RT PCR NEGATIVE NEGATIVE Final    Comment: (NOTE) SARS-CoV-2 target nucleic acids are NOT DETECTED.  The SARS-CoV-2 RNA is generally detectable in upper respiratory specimens during the acute phase of infection. The lowest concentration of SARS-CoV-2 viral copies this assay can detect is 138 copies/mL. A negative result does not preclude SARS-Cov-2 infection and should not be used as the sole basis for treatment or other patient management decisions. A negative result may occur with  improper specimen collection/handling, submission of specimen other than nasopharyngeal swab, presence of viral mutation(s) within the areas targeted by this assay, and inadequate number of viral copies(<138 copies/mL). A negative result must be combined with clinical observations, patient history, and epidemiological information. The expected result is Negative.  Fact Sheet for Patients:  EntrepreneurPulse.com.au  Fact Sheet for Healthcare Providers:  IncredibleEmployment.be  This test is no t yet approved or cleared by the [place] FDA and  has been authorized for detection and/or diagnosis of SARS-CoV-2 by FDA under an Emergency Use Authorization (EUA). This EUA will remain  in effect (meaning  this test can be used) for the duration of the COVID-19 declaration under Section  564(b)(1) of the Act, 21 U.S.C.section 360bbb-3(b)(1), unless the authorization is terminated  or revoked sooner.       Influenza A by PCR NEGATIVE NEGATIVE Final   Influenza B by PCR NEGATIVE NEGATIVE Final    Comment: (NOTE) The Xpert Xpress SARS-CoV-2/FLU/RSV plus assay is intended as an aid in the diagnosis of influenza from Nasopharyngeal swab specimens and should not be used as a sole basis for treatment. Nasal washings and aspirates are unacceptable for Xpert Xpress SARS-CoV-2/FLU/RSV testing.  Fact Sheet for Patients: EntrepreneurPulse.com.au  Fact Sheet for Healthcare Providers: IncredibleEmployment.be  This test is not yet approved or cleared by the [place] FDA and has been authorized for detection and/or diagnosis of SARS-CoV-2 by FDA under an Emergency Use Authorization (EUA). This EUA will remain in effect (meaning this test can be used) for the duration of the COVID-19 declaration under Section 564(b)(1) of the Act, 21 U.S.C. section 360bbb-3(b)(1), unless the authorization is terminated or revoked.  Performed at [facility], [address] [place], [place] [address]        Radiology Studies: No results found.    Scheduled Meds:  amiodarone  200 mg Oral BID   amoxicillin-clavulanate  1 tablet Oral BID   apixaban  5 mg Oral BID   aspirin EC  81 mg Oral Daily   atorvastatin  40 mg Oral Daily   collagenase   Topical Daily   digoxin  0.0625 mg Oral Daily   diltiazem  300 mg Oral Daily   ezetimibe  10 mg Oral Daily   furosemide  60 mg Intravenous BID   gabapentin  300 mg Oral Daily   triamcinolone cream   Topical Once per day on Mon Thu   And   hydrocerin  1 application Topical Once per day on Mon Thu   insulin aspart  0-20 Units Subcutaneous TID WC   insulin aspart  0-5 Units Subcutaneous QHS   insulin aspart  6 Units Subcutaneous TID WC   insulin glargine-yfgn  55 Units Subcutaneous BID    isosorbide mononitrate  60 mg Oral BID   metoprolol tartrate  25 mg Oral Once   metoprolol tartrate  50 mg Oral BID   pramipexole  2 mg Oral QHS   primidone  250 mg Oral BID   sodium chloride flush  3 mL Intravenous Q12H   tamsulosin  0.4 mg Oral Daily   Continuous Infusions:  sodium chloride Stopped (05/11/21 1542)     LOS: 4 days     Dessa Phi, DO [facility] 05/12/2021, 12:06 PM   Available via Epic secure chat 7am-7pm After these hours, please refer to coverage provider listed on [email or web address]

## 2021-05-12 NOTE — Progress Notes (Addendum)
Mobility Specialist - Progress Note   05/12/21 1400  Mobility  Activity Ambulated in room  Level of Assistance Standby assist, set-up cues, supervision of patient - no hands on  Assistive Device Front wheel walker  Distance Ambulated (ft) 10 ft  Mobility Ambulated with assistance in room  Mobility Response Tolerated fair  Mobility performed by Mobility specialist  $Mobility charge 1 Mobility    Pt lying in bed upon arrival, utilizing RA. Pt does voice that "R leg feels like it's going to sleep" complains of wrapping feeling like it may be too tight. Supervision and extra time to exit bed. Boot donned. Pt ambulated in room with supervision. Max cueing to follow WB protocols on LLE with poor carryover. Gestures used to demonstrate proper technique vs pt's technique. Pt sat EOB still requiring cues for WB while seated with fair carryover. Noticed pt began rubbing on RLE making grimacing faces. Pt returned supine with assist for LE support. RN entered and was notified about pt's pain and concern of bandage being too tight. Pt left in bed with needs in reach. Spouse at bedside.    Kathee Delton Mobility Specialist 05/12/21, 2:58 PM

## 2021-05-13 LAB — CULTURE, BLOOD (ROUTINE X 2)
Culture: NO GROWTH
Special Requests: ADEQUATE

## 2021-05-13 LAB — GLUCOSE, CAPILLARY
Glucose-Capillary: 80 mg/dL (ref 70–99)
Glucose-Capillary: 124 mg/dL — ABNORMAL HIGH (ref 70–99)
Glucose-Capillary: 216 mg/dL — ABNORMAL HIGH (ref 70–99)
Glucose-Capillary: 201 mg/dL — ABNORMAL HIGH (ref 70–99)
Glucose-Capillary: 306 mg/dL — ABNORMAL HIGH (ref 70–99)

## 2021-05-13 LAB — BASIC METABOLIC PANEL
Anion gap: 7 (ref 5–15)
BUN: 24 mg/dL — ABNORMAL HIGH (ref 8–23)
CO2: 32 mmol/L (ref 22–32)
Calcium: 8.2 mg/dL — ABNORMAL LOW (ref 8.9–10.3)
Chloride: 99 mmol/L (ref 98–111)
Creatinine, Ser: 1.07 mg/dL (ref 0.61–1.24)
GFR, Estimated: >60 mL/min (ref >60)
Glucose, Bld: 114 mg/dL — ABNORMAL HIGH (ref 70–99)
Potassium: 3.9 mmol/L (ref 3.5–5.1)
Sodium: 138 mmol/L (ref 135–145)

## 2021-05-13 MED ORDER — FUROSEMIDE 10 MG/ML IJ SOLN
60.0000 mg | Freq: Two times a day (BID) | INTRAMUSCULAR | Status: DC
  Administered 2021-05-13: 60 mg via INTRAVENOUS
  Filled 2021-05-13: qty 6

## 2021-05-13 MED ORDER — FUROSEMIDE 40 MG PO TABS: 60.0000 mg | ORAL_TABLET | Freq: Every day | ORAL | Status: DC

## 2021-05-13 NOTE — Progress Notes (Signed)
PROGRESS NOTE    Alec Lennox Solders.  WCB:[id number] DOB: Feb 05, 1956 DOA: 05/08/2021 PCP: [name], MD     Brief Narrative:  Alec Less. is a 66 y.o. male with medical history significant for CAD s/p CABG 2006 and PCI of RCA 12/2020 and PCI, HTN, BPH, DM, class III obesity, PAD, A. fib on apixaban, amiodarone and digoxin, sleep apnea, chronic ulcer left foot, PAD s/p left first ray amputation, chronic venous stasis on Unna boots, chronic ulcer left foot, diastolic CHF last EF 60 to 65% 12/2020, OSA on CPAP who presents to the emergency room for evaluation of worsening bilateral lower extremity edema associated with weeping of the extremities as well as dyspnea on exertion.  Patient was hospitalized in September 2022 for cellulitis of chronic left foot ulcer and found to have streptococcal bacteremia at that time.  LLE angiogram showed no significant stenosis.  He is followed both by vascular surgery and podiatry.  New events last 24 hours / Subjective: Sitting at the side of the bed, eating breakfast.  He has no complaints of chest pain or shortness of breath.  Urine output recorded 4500 mL yesterday.  Edema present but improving.  Assessment & Plan:   Principal Problem:   Anasarca Active Problems:   Essential hypertension, benign   PAD (peripheral artery disease) (HCC)   Bilateral cellulitis of lower leg   CAD (coronary artery disease), autologous vein bypass graft   Hypersomnia with sleep apnea   Diabetes mellitus without complication (HCC)   Obesity, Class III, BMI 40-49.9 (morbid obesity) (HCC)   BPH (benign prostatic hyperplasia)   Chronic anticoagulation   Acute on chronic diastolic CHF (congestive heart failure) (HCC)   Foot ulcer with fat layer exposed, left (HCC)   Venous stasis of both lower extremities   AF (paroxysmal atrial fibrillation) (HCC)   CHF exacerbation (HCC)   Bilateral lower extremity cellulitis, venous stasis ulcers, PAD -Appreciate  vascular surgery and podiatry -Vascular surgery holding off on angiogram at this time -Status post bedside debridement by Dr. Luana Shu, podiatry, 1/11.  He will need to follow-up with wound care center.  Nonweightbearing to left heel, surgical shoe was ordered -Vancomycin, cefepime, Flagyl --> Augmentin for 7 days  -Aspirin   Staph hominis bacteremia -This is likely to be a contaminant as only seen 1 of 4 blood culture bottles -On antibiotics as above regardless  Acute on chronic diastolic HF  -Repeat echocardiogram showed EF 50 to 55% -Cardiology following -IV Lasix this morning, further dosing adjustments per cardiology  -Remains volume overloaded   A. fib with RVR -Continue amiodarone, diltiazem, digoxin, metoprolol, Eliquis  Diabetes mellitus type 2, uncontrolled with hyperglycemia -A1c 9.2 -Continue Semglee, NovoLog, sliding scale insulin  CAD status post CABG -Continue aspirin, atorvastatin, Imdur  RLS -Continue pramipexole, primidone  BPH -Continue Flomax  OSA -CPAP nightly  DVT prophylaxis:  apixaban (ELIQUIS) tablet 5 mg  Code Status: Full code  Family Communication: None at bedside  Disposition Plan:  Status is: Inpatient  Remains inpatient appropriate because: Remains tachycardic, edematous      Consultants:  Cardiology Podiatry Vascular surgery   Procedures:  None   Antimicrobials:  Anti-infectives (From admission, onward)    Start     Dose/Rate Route Frequency Ordered Stop   05/11/21 1330  amoxicillin-clavulanate (AUGMENTIN) 875-125 MG per tablet 1 tablet        1 tablet Oral 2 times daily 05/11/21 1241 05/18/21 0959   05/10/21 2200  metroNIDAZOLE (FLAGYL) tablet  500 mg  Status:  Discontinued        500 mg Oral Every 12 hours 05/10/21 1238 05/11/21 1241   05/09/21 1400  vancomycin (VANCOREADY) IVPB 1500 mg/300 mL  Status:  Discontinued        1,500 mg 150 mL/hr over 120 Minutes Intravenous Every 12 hours 05/09/21 0106 05/11/21 1241    05/09/21 1300  vancomycin (VANCOREADY) IVPB 1500 mg/300 mL  Status:  Discontinued        1,500 mg 150 mL/hr over 120 Minutes Intravenous Every 12 hours 05/09/21 0104 05/09/21 0106   05/09/21 0830  metroNIDAZOLE (FLAGYL) IVPB 500 mg  Status:  Discontinued        500 mg 100 mL/hr over 60 Minutes Intravenous Every 12 hours 05/09/21 0828 05/10/21 1238   05/09/21 0600  ceFEPIme (MAXIPIME) 2 g in sodium chloride 0.9 % 100 mL IVPB  Status:  Discontinued        2 g 200 mL/hr over 30 Minutes Intravenous Every 8 hours 05/09/21 0050 05/09/21 0105   05/09/21 0600  ceFEPIme (MAXIPIME) 2 g in sodium chloride 0.9 % 100 mL IVPB  Status:  Discontinued        2 g 200 mL/hr over 30 Minutes Intravenous Every 8 hours 05/09/21 0105 05/11/21 1241   05/09/21 0000  vancomycin (VANCOCIN) IVPB 1000 mg/200 mL premix       See Hyperspace for full Linked Orders Report.   1,000 mg 200 mL/hr over 60 Minutes Intravenous  Once 05/08/21 2303 05/09/21 0625   05/09/21 0000  vancomycin (VANCOREADY) IVPB 1500 mg/300 mL       See Hyperspace for full Linked Orders Report.   1,500 mg 150 mL/hr over 120 Minutes Intravenous  Once 05/08/21 2303 05/09/21 0257   05/08/21 2315  ceFEPIme (MAXIPIME) 2 g in sodium chloride 0.9 % 100 mL IVPB        2 g 200 mL/hr over 30 Minutes Intravenous  Once 05/08/21 2303 05/09/21 0030        Objective: Vitals:   05/13/21 0015 05/13/21 0444 05/13/21 0500 05/13/21 0735  BP: 113/79 100/69  112/73  Pulse: (!) 111 (!) 110  (!) 109  Resp: 18 20  18   Temp: 97.6 F (36.4 C) 97.6 F (36.4 C)  98.2 F (36.8 C)  TempSrc:      SpO2: 96% 98%  94%  Weight:   (!) 182.8 kg   Height:        Intake/Output Summary (Last 24 hours) at 05/13/2021 1035 Last data filed at 05/12/2021 2300 Gross per 24 hour  Intake 480 ml  Output 3300 ml  Net -2820 ml    Filed Weights   05/11/21 0444 05/12/21 0428 05/13/21 0500  Weight: (!) 184.8 kg (!) 185.5 kg (!) 182.8 kg    Examination: General exam: Appears  calm and comfortable  Respiratory system: Clear to auscultation. Respiratory effort normal.  On room air Cardiovascular system: S1 & S2 heard, tachycardic, regular rhythm.  +Bilateral pedal edema. Gastrointestinal system: Abdomen is nondistended, soft and nontender. Normal bowel sounds heard. Central nervous system: Alert and oriented. Non focal exam. Speech clear  Extremities: Bilateral lower extremities in Unna boots Psychiatry: Judgement and insight appear stable. Mood & affect appropriate.    Data Reviewed: I have personally reviewed following labs and imaging studies  CBC: Recent Labs  Lab 05/08/21 1154 05/10/21 0656 05/12/21 0625  WBC 8.8 7.0 6.3  NEUTROABS 6.6  --   --   HGB 11.7* 11.3* 10.3*  HCT 39.2 37.0* 33.5*  MCV 95.6 93.7 91.3  PLT 289 281 387    Basic Metabolic Panel: Recent Labs  Lab 05/09/21 0720 05/10/21 0656 05/11/21 0644 05/12/21 0625 05/13/21 0440  NA 136 138 137 136 138  K 4.3 4.3 4.0 3.9 3.9  CL 96* 100 100 100 99  CO2 32 31 33* 31 32  GLUCOSE 178* 192* 301* 114* 114*  BUN 20 23 20 22  24*  CREATININE 1.15 1.14 1.01 1.05 1.07  CALCIUM 8.4* 8.2* 8.0* 8.0* 8.2*  MG  --   --  2.0  --   --     GFR: Estimated Creatinine Clearance: 120.3 mL/min (by C-G formula based on SCr of 1.07 mg/dL). Liver Function Tests: Recent Labs  Lab 05/08/21 1154  AST 27  ALT 31  ALKPHOS 197*  BILITOT 0.8  PROT 7.3  ALBUMIN 2.9*    No results for input(s): LIPASE, AMYLASE in the last 168 hours. No results for input(s): AMMONIA in the last 168 hours. Coagulation Profile: Recent Labs  Lab 05/08/21 1154  INR 1.2    Cardiac Enzymes: No results for input(s): CKTOTAL, CKMB, CKMBINDEX, TROPONINI in the last 168 hours. BNP (last 3 results) No results for input(s): PROBNP in the last 8760 hours. HbA1C: No results for input(s): HGBA1C in the last 72 hours.  CBG: Recent Labs  Lab 05/12/21 0813 05/12/21 1145 05/12/21 1638 05/12/21 2024 05/13/21 0736   GLUCAP 89 137* 217* 202* 80    Lipid Profile: No results for input(s): CHOL, HDL, LDLCALC, TRIG, CHOLHDL, LDLDIRECT in the last 72 hours. Thyroid Function Tests: No results for input(s): TSH, T4TOTAL, FREET4, T3FREE, THYROIDAB in the last 72 hours. Anemia Panel: No results for input(s): VITAMINB12, FOLATE, FERRITIN, TIBC, IRON, RETICCTPCT in the last 72 hours. Sepsis Labs: Recent Labs  Lab 05/08/21 1154 05/08/21 1258  LATICACIDVEN 2.3* 1.5     Recent Results (from the past 240 hour(s))  Culture, blood (Routine x 2)     Status: None   Collection Time: 05/08/21 11:54 AM   Specimen: BLOOD  Result Value Ref Range Status   Specimen Description BLOOD LEFT ANTECUBITAL  Final   Special Requests   Final    BOTTLES DRAWN AEROBIC AND ANAEROBIC Blood Culture adequate volume   Culture   Final    NO GROWTH 5 DAYS Performed at [facility], [address] [place], [place] [address]    Report Status 05/13/2021 FINAL  Final  Culture, blood (Routine x 2)     Status: Abnormal   Collection Time: 05/08/21 12:56 PM   Specimen: BLOOD  Result Value Ref Range Status   Specimen Description   Final    BLOOD BLOOD LEFT FOREARM Performed at [facility], [address] [place], [place] [address]    Special Requests   Final    BOTTLES DRAWN AEROBIC AND ANAEROBIC Blood Culture adequate volume Performed at [facility], [address] [place], [place] [address]    Culture  Setup Time   Final    GRAM POSITIVE COCCI Organism ID to follow AEROBIC BOTTLE ONLY CRITICAL RESULT CALLED TO, READ BACK BY AND VERIFIED WITH: LISA KOUTZZ 05/09/21 0706 Performed at [facility], [address] [place], [place] [address]    Culture (A)  Final    STAPHYLOCOCCUS HOMINIS THE SIGNIFICANCE OF ISOLATING THIS ORGANISM FROM A SINGLE SET OF BLOOD CULTURES WHEN MULTIPLE SETS ARE DRAWN IS UNCERTAIN. PLEASE NOTIFY THE MICROBIOLOGY DEPARTMENT WITHIN ONE WEEK IF SPECIATION  AND  SENSITIVITIES ARE REQUIRED. Performed at [facility], [place] [address] [place], [place] [address]    Report Status 05/11/2021 FINAL  Final  Blood Culture ID Panel (Reflexed)     Status: Abnormal   Collection Time: 05/08/21 12:56 PM  Result Value Ref Range Status   Enterococcus faecalis NOT DETECTED NOT DETECTED Final   Enterococcus Faecium NOT DETECTED NOT DETECTED Final   Listeria monocytogenes NOT DETECTED NOT DETECTED Final   Staphylococcus species DETECTED (A) NOT DETECTED Final    Comment: CRITICAL RESULT CALLED TO, READ BACK BY AND VERIFIED WITH: LISA KOTZZ 05/09/21 0707 MW    Staphylococcus aureus (BCID) NOT DETECTED NOT DETECTED Final   Staphylococcus epidermidis NOT DETECTED NOT DETECTED Final   Staphylococcus lugdunensis NOT DETECTED NOT DETECTED Final   Streptococcus species NOT DETECTED NOT DETECTED Final   Streptococcus agalactiae NOT DETECTED NOT DETECTED Final   Streptococcus pneumoniae NOT DETECTED NOT DETECTED Final   Streptococcus pyogenes NOT DETECTED NOT DETECTED Final   A.calcoaceticus-baumannii NOT DETECTED NOT DETECTED Final   Bacteroides fragilis NOT DETECTED NOT DETECTED Final   Enterobacterales NOT DETECTED NOT DETECTED Final   Enterobacter cloacae complex NOT DETECTED NOT DETECTED Final   Escherichia coli NOT DETECTED NOT DETECTED Final   Klebsiella aerogenes NOT DETECTED NOT DETECTED Final   Klebsiella oxytoca NOT DETECTED NOT DETECTED Final   Klebsiella pneumoniae NOT DETECTED NOT DETECTED Final   Proteus species NOT DETECTED NOT DETECTED Final   Salmonella species NOT DETECTED NOT DETECTED Final   Serratia marcescens NOT DETECTED NOT DETECTED Final   Haemophilus influenzae NOT DETECTED NOT DETECTED Final   Neisseria meningitidis NOT DETECTED NOT DETECTED Final   Pseudomonas aeruginosa NOT DETECTED NOT DETECTED Final   Stenotrophomonas maltophilia NOT DETECTED NOT DETECTED Final   Candida albicans NOT DETECTED NOT DETECTED Final   Candida auris  NOT DETECTED NOT DETECTED Final   Candida glabrata NOT DETECTED NOT DETECTED Final   Candida krusei NOT DETECTED NOT DETECTED Final   Candida parapsilosis NOT DETECTED NOT DETECTED Final   Candida tropicalis NOT DETECTED NOT DETECTED Final   Cryptococcus neoformans/gattii NOT DETECTED NOT DETECTED Final    Comment: Performed at [facility], [address] [place], [place] [address]  Resp Panel by RT-PCR (Flu A&B, Covid) Nasopharyngeal Swab     Status: None   Collection Time: 05/09/21  9:50 AM   Specimen: Nasopharyngeal Swab; Nasopharyngeal(NP) swabs in vial transport medium  Result Value Ref Range Status   SARS Coronavirus 2 by RT PCR NEGATIVE NEGATIVE Final    Comment: (NOTE) SARS-CoV-2 target nucleic acids are NOT DETECTED.  The SARS-CoV-2 RNA is generally detectable in upper respiratory specimens during the acute phase of infection. The lowest concentration of SARS-CoV-2 viral copies this assay can detect is 138 copies/mL. A negative result does not preclude SARS-Cov-2 infection and should not be used as the sole basis for treatment or other patient management decisions. A negative result may occur with  improper specimen collection/handling, submission of specimen other than nasopharyngeal swab, presence of viral mutation(s) within the areas targeted by this assay, and inadequate number of viral copies(<138 copies/mL). A negative result must be combined with clinical observations, patient history, and epidemiological information. The expected result is Negative.  Fact Sheet for Patients:  EntrepreneurPulse.com.au  Fact Sheet for Healthcare Providers:  IncredibleEmployment.be  This test is no t yet approved or cleared by the [place] FDA and  has been authorized for detection and/or diagnosis of SARS-CoV-2  by FDA under an Emergency Use Authorization (EUA). This EUA will remain  in effect (meaning this test can be used) for  the duration of the COVID-19 declaration under Section 564(b)(1) of the Act, 21 U.S.C.section 360bbb-3(b)(1), unless the authorization is terminated  or revoked sooner.       Influenza A by PCR NEGATIVE NEGATIVE Final   Influenza B by PCR NEGATIVE NEGATIVE Final    Comment: (NOTE) The Xpert Xpress SARS-CoV-2/FLU/RSV plus assay is intended as an aid in the diagnosis of influenza from Nasopharyngeal swab specimens and should not be used as a sole basis for treatment. Nasal washings and aspirates are unacceptable for Xpert Xpress SARS-CoV-2/FLU/RSV testing.  Fact Sheet for Patients: EntrepreneurPulse.com.au  Fact Sheet for Healthcare Providers: IncredibleEmployment.be  This test is not yet approved or cleared by the [place] FDA and has been authorized for detection and/or diagnosis of SARS-CoV-2 by FDA under an Emergency Use Authorization (EUA). This EUA will remain in effect (meaning this test can be used) for the duration of the COVID-19 declaration under Section 564(b)(1) of the Act, 21 U.S.C. section 360bbb-3(b)(1), unless the authorization is terminated or revoked.  Performed at [facility], [address], [place], [place] [address]        Radiology Studies: No results found.    Scheduled Meds:  amiodarone  200 mg Oral BID   amoxicillin-clavulanate  1 tablet Oral BID   apixaban  5 mg Oral BID   aspirin EC  81 mg Oral Daily   atorvastatin  40 mg Oral Daily   collagenase   Topical Daily   digoxin  0.0625 mg Oral Daily   diltiazem  300 mg Oral Daily   ezetimibe  10 mg Oral Daily   gabapentin  300 mg Oral Daily   triamcinolone cream   Topical Once per day on Mon Thu   And   hydrocerin  1 application Topical Once per day on Mon Thu   insulin aspart  0-20 Units Subcutaneous TID WC   insulin aspart  0-5 Units Subcutaneous QHS   insulin aspart  6 Units Subcutaneous TID WC   insulin glargine-yfgn  55 Units  Subcutaneous BID   isosorbide mononitrate  60 mg Oral BID   metoprolol tartrate  50 mg Oral BID   pramipexole  2 mg Oral QHS   primidone  250 mg Oral BID   sodium chloride flush  3 mL Intravenous Q12H   tamsulosin  0.4 mg Oral Daily   Continuous Infusions:  sodium chloride Stopped (05/11/21 1542)     LOS: 5 days     Dessa Phi, DO [facility] 05/13/2021, 10:35 AM   Available via Epic secure chat 7am-7pm After these hours, please refer to coverage provider listed on [email or web address]

## 2021-05-13 NOTE — Progress Notes (Signed)
CARDIOLOGY CONSULT NOTE               Patient ID: [name]. MRN: [id number] DOB/AGE: 67-Dec-1957 67 y.o.  Admit date: 05/08/2021 Referring Physician Dr Damita Dunnings Primary Physician Dr. Doy Hutching Primary Cardiologist Dr. Bartholome Bill  Reason for Consultation heart failure  HPI: pt is [phone] with a PMH significant for CAD s/p CABG 2006 and PCI of RCA 01/2020, HFpEF (LVEF 60 to 65% 12/2020),HTN, BPH, DM, class III obesity, PAD s/p left third toe amputation, chronic venous stasis on Unna boots,  A. fib on apixaban, amiodarone and digoxin, sleep apnea, chronic ulcer left foot, , OSA on CPAP who presents to the emergency room for evaluation of worsening bilateral lower extremity edema associated with weeping of the extremities.  He was hospitalized 12/2020 with a similar presentation of lower extremity cellulitis and had strep bacteremia at that time. Cardiology was consulted for assistance with diuresis and his heart failure.  Interval history: - Feeling better but continues to have significant edema.  - No shortness of breath.   Review of systems complete and found to be negative unless listed above   Past Medical History:  Diagnosis Date   Asthma    Coronary artery disease    Depression    Diabetes mellitus without complication ([place])    Gout    History anabolic steroid use    Hyperlipidemia    Hypertension    Hypogonadism in male    Morbid obesity ([place])    Myocardial infarction ([place])    Peripheral vascular disease ([place])    Perirectal abscess    Pleurisy    Sleep apnea    CPAP at night, no oxygen   Varicella     Past Surgical History:  Procedure Laterality Date   ABDOMINAL AORTIC ANEURYSM REPAIR     ACHILLES TENDON SURGERY Left 01/10/2021   Procedure: ACHILLES LENGTHENING/KIDNER;  Surgeon: Caroline More, DPM;  Location: [facility];  Service: Podiatry;  Laterality: Left;   AMPUTATION TOE Right 02/10/2016   Procedure: AMPUTATION TOE 3RD TOE;  Surgeon: Samara Deist, DPM;   Location: [facility];  Service: Podiatry;  Laterality: Right;   AMPUTATION TOE Left 02/24/2020   Procedure: AMPUTATION TOE;  Surgeon: Caroline More, DPM;  Location: [facility];  Service: Podiatry;  Laterality: Left;   APPLICATION OF WOUND VAC Left 02/29/2020   Procedure: APPLICATION OF WOUND VAC;  Surgeon: Caroline More, DPM;  Location: [facility];  Service: Podiatry;  Laterality: Left;   COLONOSCOPY WITH PROPOFOL [name] 11/18/2015   Procedure: COLONOSCOPY WITH PROPOFOL;  Surgeon: Manya Silvas, MD;  Location: [facility] ENDOSCOPY;  Service: Endoscopy;  Laterality: [name];   CORONARY ARTERY BYPASS GRAFT     CORONARY STENT INTERVENTION [name] 02/02/2020   Procedure: CORONARY STENT INTERVENTION;  Surgeon: Isaias Cowman, MD;  Location: [place] [facility];  Service: Cardiovascular;  Laterality: [name];   IRRIGATION AND DEBRIDEMENT FOOT Left 02/29/2020   Procedure: IRRIGATION AND DEBRIDEMENT FOOT;  Surgeon: Caroline More, DPM;  Location: [facility];  Service: Podiatry;  Laterality: Left;   IRRIGATION AND DEBRIDEMENT FOOT Left 02/24/2020   Procedure: IRRIGATION AND DEBRIDEMENT FOOT;  Surgeon: Caroline More, DPM;  Location: [facility];  Service: Podiatry;  Laterality: Left;   KNEE ARTHROSCOPY     LEFT HEART CATH AND CORS/GRAFTS ANGIOGRAPHY [name] 02/02/2020   Procedure: LEFT HEART CATH AND CORS/GRAFTS ANGIOGRAPHY;  Surgeon: Teodoro Spray, MD;  Location: [place] [facility];  Service: Cardiovascular;  Laterality: [name];   LOWER EXTREMITY ANGIOGRAPHY  Left 02/25/2020   Procedure: Lower Extremity Angiography;  Surgeon: Algernon Huxley, MD;  Location: [place] [facility];  Service: Cardiovascular;  Laterality: Left;   LOWER EXTREMITY ANGIOGRAPHY Left 01/04/2021   Procedure: LOWER EXTREMITY ANGIOGRAPHY;  Surgeon: Algernon Huxley, MD;  Location: [place] [facility];  Service: Cardiovascular;  Laterality: Left;   METATARSAL HEAD EXCISION Left 01/10/2021   Procedure: METATARSAL HEAD EXCISION - LEFT 5th;  Surgeon: Caroline More, DPM;   Location: [facility];  Service: Podiatry;  Laterality: Left;   PERIPHERAL VASCULAR CATHETERIZATION Right 01/24/2016   Procedure: Lower Extremity Angiography;  Surgeon: Katha Cabal, MD;  Location: [place] [facility];  Service: Cardiovascular;  Laterality: Right;   PERIPHERAL VASCULAR CATHETERIZATION Right 01/25/2016   Procedure: Lower Extremity Angiography;  Surgeon: Katha Cabal, MD;  Location: [place] [facility];  Service: Cardiovascular;  Laterality: Right;   TOE AMPUTATION     TONSILLECTOMY      Medications Prior to Admission  Medication Sig Dispense Refill Last Dose   amiodarone (PACERONE) 200 MG tablet Take 1 tablet (200 mg total) by mouth daily.   05/08/2021 at 0830   apixaban (ELIQUIS) 5 MG TABS tablet Take 1 tablet (5 mg total) by mouth 2 (two) times daily. 60 tablet 5 05/08/2021 at 0830   digoxin 62.5 MCG TABS Take 0.0625 mg by mouth daily.   05/08/2021 at 0830   diltiazem (CARDIZEM CD) 300 MG 24 hr capsule Take 1 capsule (300 mg total) by mouth daily.   05/08/2021 at 0830   ezetimibe (ZETIA) 10 MG tablet Take 10 mg by mouth daily.   05/06/2021 at 2100   ferrous sulfate 325 (65 FE) MG tablet Take 325 mg by mouth daily with breakfast.   05/08/2021 at 0830   furosemide (LASIX) 20 MG tablet Take 40 mg by mouth daily.   05/08/2021 at 0830   gabapentin (NEURONTIN) 300 MG capsule Take 300 mg by mouth at bedtime.   05/06/2021 at 2100   insulin aspart (NOVOLOG) 100 UNIT/ML injection Inject 9 Units into the skin 3 (three) times daily with meals. (Patient taking differently: Inject 15 Units into the skin 3 (three) times daily with meals. SSI extra if BG high from 3 to 15 units) 10 mL 11 05/08/2021 at 0830   isosorbide mononitrate (IMDUR) 60 MG 24 hr tablet Take 60 mg by mouth 2 (two) times daily.   05/08/2021 at [place] 100 UNIT/ML Solostar Pen Inject 50 Units into the skin 2 (two) times daily. (Patient taking differently: Inject 14 Units into the skin in the morning.) 15 mL 11 05/08/2021 at  0830   metFORMIN (GLUCOPHAGE) 1000 MG tablet Take 1,000 mg by mouth 2 (two) times daily.    05/08/2021 at 0830   metoprolol tartrate (LOPRESSOR) 50 MG tablet Take 50 mg by mouth 2 (two) times daily.   05/08/2021 at 0830   Multiple Vitamins-Minerals (MULTIVITAMIN WITH MINERALS) tablet Take 1 tablet by mouth daily.   05/08/2021 at 0830   nitroGLYCERIN (NITROSTAT) 0.4 MG SL tablet Place 0.4 mg under the tongue daily as needed.   prn at prn   potassium chloride (KLOR-CON) 10 MEQ tablet Take 10 mEq by mouth daily.   05/08/2021 at 0830   pramipexole (MIRAPEX) 1 MG tablet Take 2 mg by mouth at bedtime.   05/06/2021 at 2100   primidone (MYSOLINE) 250 MG tablet Take 250 mg by mouth 2 (two) times daily.   05/08/2021 at 0830   tamsulosin (  FLOMAX) 0.4 MG CAPS capsule Take 0.4 mg by mouth daily.   05/08/2021 at 0830   vitamin B-12 (CYANOCOBALAMIN) 1000 MCG tablet Take 10,000 mcg by mouth daily. Energy shot   05/08/2021 at 0830   zinc gluconate 50 MG tablet Take 50 mg by mouth daily.   05/08/2021 at 0830   atorvastatin (LIPITOR) 40 MG tablet Take 1 tablet (40 mg total) by mouth daily. 30 tablet 0    metoprolol tartrate (LOPRESSOR) 25 MG tablet Take 1 tablet (25 mg total) by mouth 2 (two) times daily. (Patient not taking: Reported on 05/09/2021)   Not Taking   OZEMPIC, 0.25 OR 0.5 MG/DOSE, 2 MG/1.5ML SOPN Inject 0.25 mg into the skin once a week. (Patient not taking: Reported on 05/09/2021)   Not Taking    Social History   Socioeconomic History   Marital status: Married    Spouse name: Not on file   Number of children: Not on file   Years of education: Not on file   Highest education level: Not on file  Occupational History   Not on file  Tobacco Use   Smoking status: Former    Packs/day: 0.50    Years: 45.00    Pack years: 22.50    Types: Cigarettes    Quit date: 04/05/2015    Years since quitting: 6.1   Smokeless tobacco: Never  Vaping Use   Vaping Use: Every day  Substance and Sexual Activity   Alcohol use: Not  Currently    Alcohol/week: 3.0 standard drinks    Types: 3 Glasses of wine per week   Drug use: No   Sexual activity: Not on file  Other Topics Concern   Not on file  Social History Narrative   Not on file   Social Determinants of Health   Financial Resource Strain: Not on file  Food Insecurity: Not on file  Transportation Needs: Not on file  Physical Activity: Not on file  Stress: Not on file  Social Connections: Not on file  Intimate Partner Violence: Not on file    History reviewed. No pertinent family history.    Review of systems complete and found to be negative unless listed above   PHYSICAL EXAM General: Pleasant Caucasian male, well nourished, in no acute distress. Examined laying flat in PCU bed.  HEENT:  Normocephalic and atraumatic. Neck:  No JVD.  Lungs: Normal respiratory effort on room air. no crackles. No wheezes, rhonchi.  Heart: Tachycardic irregularly irregular rhythm. Normal S1 and S2 without gallops or murmurs. Radial pulse 2+ bilaterally. Abdomen: Obese appearing.  Msk: Normal strength and tone for age. Extremities: bilateral lower extremities wrapped in ACE bandages.  Neuro: Alert and oriented x3 Psych: Mood appropriate for situation.  Labs:   Lab Results  Component Value Date   WBC 6.3 05/12/2021   HGB 10.3 (L) 05/12/2021   HCT 33.5 (L) 05/12/2021   MCV 91.3 05/12/2021   PLT 274 05/12/2021    Recent Labs  Lab 05/08/21 1154 05/09/21 0720 05/13/21 0440  NA 139   < > 138  K 5.1   < > 3.9  CL 97*   < > 99  CO2 32   < > 32  BUN 20   < > 24*  CREATININE 1.02   < > 1.07  CALCIUM 8.2*   < > 8.2*  PROT 7.3  --   --   BILITOT 0.8  --   --   ALKPHOS 197*  --   --  ALT 31  --   --   AST 27  --   --   GLUCOSE 209*   < > 114*   < > = values in this interval not displayed.    No results found for: CKTOTAL, CKMB, CKMBINDEX, TROPONINI No results found for: CHOL No results found for: HDL No results found for: LDLCALC No results found for:  TRIG No results found for: CHOLHDL No results found for: LDLDIRECT    Radiology: DG Chest 2 View  Result Date: 05/08/2021 CLINICAL DATA:  Sepsis. EXAM: CHEST - 2 VIEW COMPARISON:  January 16, 2021. FINDINGS: Status post coronary bypass graft. Stable cardiomegaly with central pulmonary vascular congestion is noted. Mild bilateral pulmonary edema may be present. No consolidative process is noted. Bony thorax is unremarkable. IMPRESSION: Stable cardiomegaly with mild central pulmonary vascular congestion and probable mild bilateral pulmonary edema. Electronically Signed   By: Marijo Conception M.D.   On: 05/08/2021 12:31   US Venous Img Lower Bilateral  Result Date: 05/08/2021 CLINICAL DATA:  Pain and swelling EXAM: Bilateral LOWER EXTREMITY VENOUS DOPPLER ULTRASOUND TECHNIQUE: Gray-scale sonography with compression, as well as color and duplex ultrasound, were performed to evaluate the deep venous system(s) from the level of the common femoral vein through the popliteal and proximal calf veins. COMPARISON:  None. FINDINGS: VENOUS Normal compressibility of the common femoral, superficial femoral, and popliteal veins, as well as the visualized calf veins. Visualized portions of profunda femoral vein and great saphenous vein unremarkable. No filling defects to suggest DVT on grayscale or color Doppler imaging. Doppler waveforms show normal direction of venous flow, normal respiratory plasticity and response to augmentation. OTHER None. Limitations: Edema and habitus. IMPRESSION: Negative. Electronically Signed   By: Donavan Foil M.D.   On: 05/08/2021 21:19   DG Foot Complete Left  Result Date: 05/08/2021 CLINICAL DATA:  Bilateral foot wound EXAM: LEFT FOOT - COMPLETE 3+ VIEW COMPARISON:  01/10/2021 FINDINGS: No acute fracture is seen. Previous trans meta tarsal amputation third digit and partial amputation of fifth metatarsal. Cut margins appear smooth. No bony destructive change. No soft tissue emphysema.  IMPRESSION: No acute osseous abnormality. Previous transmetatarsal amputation third digit and partial amputation of fifth metatarsal. Electronically Signed   By: Donavan Foil M.D.   On: 05/08/2021 23:06   DG Foot Complete Right  Result Date: 05/08/2021 CLINICAL DATA:  Foot wound EXAM: RIGHT FOOT COMPLETE - 3+ VIEW COMPARISON:  None. FINDINGS: No fracture is seen. Prior transmetatarsal amputation second digit and previous amputation of third digit at the level of the MTP joint. Smooth cut margins. Irregular arthropathy with erosive changes at the first IP joint of uncertain chronicity. There is soft tissue swelling. Negative for soft tissue emphysema. IMPRESSION: 1. Previous partial amputation of the second and third digits without acute findings here 2. Irregular arthropathy at the first IP joint with erosive changes of uncertain chronicity, correlate for signs of active infection here. Evaluation with MRI may be obtained as indicated. Electronically Signed   By: Donavan Foil M.D.   On: 05/08/2021 23:09   ECHOCARDIOGRAM COMPLETE  Result Date: 05/10/2021    ECHOCARDIOGRAM REPORT   Patient Name:   [name]. Date of Exam: 05/10/2021 Medical Rec #:  [id number]             Height:       76.0 in Accession #:    [id number]            Weight:  350.0 lb Date of Birth:  06-13-1955            BSA:          2.810 m Patient Age:    75 years              BP:           114/65 mmHg Patient Gender: M                     HR:           118 bpm. Exam Location:  [facility] Procedure: 2D Echo, Cardiac Doppler and Color Doppler Indications:     CHF I50.9  History:         Patient has prior history of Echocardiogram examinations, most                  recent 01/04/2021. Previous Myocardial Infarction; Risk                  Factors:Hypertension and Diabetes.  Sonographer:     [name] Sport Referring Phys:  [id number] [place] TANG Diagnosing Phys: Donnelly Angelica  Sonographer Comments: Technically challenging study due to limited  acoustic windows, suboptimal parasternal window, no apical window and no subcostal window. IMPRESSIONS  1. Left ventricular ejection fraction, by estimation, is 50 to 55%. The left ventricle has low normal function. The left ventricle has no regional wall motion abnormalities. There is moderate left ventricular hypertrophy. Left ventricular diastolic function could not be evaluated.  2. Right ventricular systolic function was not well visualized. The right ventricular size is not well visualized.  3. The mitral valve is normal in structure. No evidence of mitral valve regurgitation.  4. The aortic valve is normal in structure. Aortic valve regurgitation is not visualized. Aortic valve sclerosis is present, with no evidence of aortic valve stenosis. FINDINGS  Left Ventricle: Left ventricular ejection fraction, by estimation, is 50 to 55%. The left ventricle has low normal function. The left ventricle has no regional wall motion abnormalities. The left ventricular internal cavity size was normal in size. There is moderate left ventricular hypertrophy. Left ventricular diastolic function could not be evaluated. Right Ventricle: The right ventricular size is not well visualized. Right vetricular wall thickness was not well visualized. Right ventricular systolic function was not well visualized. Left Atrium: Left atrial size was not well visualized. Right Atrium: Right atrial size was not well visualized. Pericardium: There is no evidence of pericardial effusion. Mitral Valve: The mitral valve is normal in structure. Mild mitral annular calcification. No evidence of mitral valve regurgitation. Tricuspid Valve: The tricuspid valve is normal in structure. Tricuspid valve regurgitation is not demonstrated. Aortic Valve: The aortic valve is normal in structure. Aortic valve regurgitation is not visualized. Aortic valve sclerosis is present, with no evidence of aortic valve stenosis. Pulmonic Valve: The pulmonic valve was not  well visualized. Pulmonic valve regurgitation is not visualized. No evidence of pulmonic stenosis. Aorta: The aortic root is normal in size and structure. Venous: The inferior vena cava was not well visualized. IAS/Shunts: The interatrial septum was not well visualized.  LEFT VENTRICLE PLAX 2D LVIDd:         5.80 cm LVIDs:         3.90 cm LV PW:         1.20 cm LV IVS:        1.60 cm LVOT diam:     2.00 cm LVOT Area:  3.14 cm  LEFT ATRIUM         Index LA diam:    5.10 cm 1.81 cm/m                        PULMONIC VALVE AORTA                 PV Vmax:        0.48 m/s Ao Root diam: 3.20 cm PV Vmean:       28.700 cm/s                       PV VTI:         0.057 m                       PV Peak grad:   0.9 mmHg                       PV Mean grad:   0.0 mmHg                       RVOT Peak grad: 1 mmHg   SHUNTS Systemic Diam: 2.00 cm Pulmonic VTI:  0.077 m Donnelly Angelica Electronically signed by Donnelly Angelica Signature Date/Time: 05/10/2021/9:11:30 AM    Final     ECHO LVEF 60 to 65% 12/2020  TELEMETRY reviewed by me: atrial flutter 119  EKG reviewed by me: atrial flutter with 2:1 conduction 123  ASSESSMENT AND PLAN:  65yoM with a PMH significant for CAD s/p CABG 2006 and PCI of RCA 01/2020, HFpEF (LVEF 60 to 65% 12/2020), HTN, BPH, diabetes type 2, class III obesity, PAD s/p left third toe amputation, chronic venous stasis on Unna boots,  A. fib on apixaban, amiodarone and digoxin, sleep apnea, chronic ulcer left foot, OSA on CPAP who presents to the emergency room for evaluation of worsening bilateral lower extremity edema associated with weeping of the extremities.  Cardiology was consulted for assistance with diuresis and his heart failure.  #Acute on chronic HFpEF (LVEF 50-55% 05/10/2020) -clinically improving, off O2 since 1/12.  -continue IV lasix 60mg  BID for now. Still appears volume overloaded.  -renal function stable.  -strongly reiterated fluid restrictions to 64 ounces per day, monitoring salt intake.   -Monitor I's and O's while inpatient.  #PAD s/p left third toe amputation and right toe amputations #Chronic lower extremity wounds associated with venous stasis -Appreciate vascular surgery, podiatry, and wound care recommendations -on cefepime day 3 and vancomycin day 2. -completed ~24hrs of flagyl 1/11  #Paroxysmal Atrial fibrillation on eliquis CHA2DS2-VASc 5. -remains in atrial flutter with rate around ~110. May consider DCCV if persistent in the future.  -continue PO amiodarone 200mg  BID today.  -Continue metoprolol tartrate at 50mg  BID   -Continue digoxin 0.0625 mg once daily, Cardizem CD 300 mg -Continue eliquis 5mg  BID.   #CAD s/p CABG 2006 and PCI of RCA 01/2020 #HTN #Hyperlipidemia -Continue Imdur -Continue Zetia -Would recommend high intensity statin therapy however patient has reported allergy of myalgias to statins -Plan as above  #type 2 diabetes Poorly controlled. A1c on admission 9.2, mgmt per primary team   Signed: Andrez Grime , MD 05/13/2021, 1:08 PM

## 2021-05-13 NOTE — Plan of Care (Signed)
°  Problem: Education: Goal: Knowledge of General Education information will improve Description: Including pain rating scale, medication(s)/side effects and non-pharmacologic comfort measures Outcome: Progressing   Problem: Health Behavior/Discharge Planning: Goal: Ability to manage health-related needs will improve Outcome: Progressing   Problem: Clinical Measurements: Goal: Ability to maintain clinical measurements within normal limits will improve Outcome: Progressing Goal: Will remain free from infection Outcome: Progressing Goal: Diagnostic test results will improve Outcome: Progressing Goal: Respiratory complications will improve Outcome: Progressing Goal: Cardiovascular complication will be avoided Outcome: Progressing   Problem: Activity: Goal: Risk for activity intolerance will decrease Outcome: Progressing   Problem: Nutrition: Goal: Adequate nutrition will be maintained Outcome: Progressing   Problem: Coping: Goal: Level of anxiety will decrease Outcome: Progressing   Problem: Elimination: Goal: Will not experience complications related to bowel motility Outcome: Progressing Goal: Will not experience complications related to urinary retention Outcome: Progressing   Problem: Pain Managment: Goal: General experience of comfort will improve Outcome: Progressing   Problem: Safety: Goal: Ability to remain free from injury will improve Outcome: Progressing   Problem: Skin Integrity: Goal: Risk for impaired skin integrity will decrease Outcome: Progressing   Problem: Education: Goal: Ability to demonstrate management of disease process will improve Outcome: Progressing Goal: Ability to verbalize understanding of medication therapies will improve Outcome: Progressing

## 2021-05-13 NOTE — Progress Notes (Signed)
°   05/12/21 1959  Assess: MEWS Score  Temp 97.8 F (36.6 C)  BP 100/81  Pulse Rate (!) 111  Resp 20  SpO2 94 %  O2 Device Room Air  Assess: MEWS Score  MEWS Temp 0  MEWS Systolic 1  MEWS Pulse 2  MEWS RR 0  MEWS LOC 0  MEWS Score 3  MEWS Score Color Yellow  Assess: if the MEWS score is Yellow or Red  Were vital signs taken at a resting state? Yes  Focused Assessment No change from prior assessment  Does the patient meet 2 or more of the SIRS criteria? No  MEWS guidelines implemented *See Row Information* Yes  Treat  Pain Scale 0-10  Pain Score 4  Take Vital Signs  Increase Vital Sign Frequency  Yellow: Q 2hr X 2 then Q 4hr X 2, if remains yellow, continue Q 4hrs  Escalate  MEWS: Escalate Yellow: discuss with charge nurse/RN and consider discussing with provider and RRT  Notify: Charge Nurse/RN  Name of Charge Nurse/RN Notified Felicia RN  Date Charge Nurse/RN Notified 05/12/21  Time Charge Nurse/RN Notified 2030  Document  Patient Outcome Stabilized after interventions  Progress note created (see row info) Yes  Assess: SIRS CRITERIA  SIRS Temperature  0  SIRS Pulse 1  SIRS Respirations  0  SIRS WBC 0  SIRS Score Sum  1    Yellow MEWs guidelines initiated due to tachycardia and mild hypotension.  Patient has been tachycardic this admission and received narcotic pain medication on prior shift.  No symptoms related to elevated heart rate or low BP.  Charge nurse aware, Patient resting comfortably at this time with no complaints.

## 2021-05-14 LAB — BASIC METABOLIC PANEL
Anion gap: 7 (ref 5–15)
BUN: 22 mg/dL (ref 8–23)
CO2: 33 mmol/L — ABNORMAL HIGH (ref 22–32)
Calcium: 8.2 mg/dL — ABNORMAL LOW (ref 8.9–10.3)
Chloride: 97 mmol/L — ABNORMAL LOW (ref 98–111)
Creatinine, Ser: 1.12 mg/dL (ref 0.61–1.24)
GFR, Estimated: >60 mL/min (ref >60)
Glucose, Bld: 217 mg/dL — ABNORMAL HIGH (ref 70–99)
Potassium: 4.2 mmol/L (ref 3.5–5.1)
Sodium: 137 mmol/L (ref 135–145)

## 2021-05-14 LAB — GLUCOSE, CAPILLARY
Glucose-Capillary: 203 mg/dL — ABNORMAL HIGH (ref 70–99)
Glucose-Capillary: 222 mg/dL — ABNORMAL HIGH (ref 70–99)
Glucose-Capillary: 233 mg/dL — ABNORMAL HIGH (ref 70–99)

## 2021-05-14 MED ORDER — FUROSEMIDE 40 MG PO TABS
60.0000 mg | ORAL_TABLET | Freq: Every day | ORAL | Status: DC
  Administered 2021-05-14 – 2021-05-16 (×3): 60 mg via ORAL
  Filled 2021-05-14 (×3): qty 1

## 2021-05-14 MED ORDER — INSULIN ASPART 100 UNIT/ML IJ SOLN
10.0000 [IU] | Freq: Three times a day (TID) | INTRAMUSCULAR | Status: DC
  Administered 2021-05-14 – 2021-05-16 (×6): 10 [IU] via SUBCUTANEOUS
  Filled 2021-05-14 (×6): qty 1

## 2021-05-14 NOTE — TOC Initial Note (Addendum)
Transition of Care (TOC) - Initial/Assessment Note    Patient Details  Name: [name]. MRN: [id number] Date of Birth: 06-20-1955  Transition of Care [facility]) CM/SW Contact:    Alberteen Sam, LCSW Phone Number: 05/14/2021, 1:10 PM  Clinical Narrative:                  CSW spoke with patient regarding home health services, agreeable with no preference of agency.   CSW sent referral to [facility] with [facility], agreeable to accept patient for [facility] PT, OT and RN.   Patient reports he has a rolling walker at home and does not need a 3in1.   Patient reports his spouse will transport him home tomorrow.   No further needs identified at this time.   Expected Discharge Plan: [place] Barriers to Discharge: Continued Medical Work up   Patient Goals and CMS Choice Patient states their goals for this hospitalization and ongoing recovery are:: to go home CMS Medicare.gov Compare Post Acute Care list provided to:: Patient Choice offered to / list presented to : Patient  Expected Discharge Plan and Services Expected Discharge Plan: [place]       Living arrangements for the past 2 months: Single Family Home                           HH Arranged: PT, RN [facility] Agency: [place] Date HH Agency Contacted: 05/14/21 Time HH Agency Contacted: 63 Representative spoke with at [place]: Corene Cornea  Prior Living Arrangements/Services Living arrangements for the past 2 months: [place] with:: Spouse   Do you feel safe going back to the place where you live?: Yes               Activities of Daily Living Home Assistive Devices/Equipment: Wheelchair, Shower chair with back, Radio producer (specify quad or straight), CPAP ADL Screening (condition at time of admission) Patient's cognitive ability adequate to safely complete daily activities?: Yes Is the patient deaf or have difficulty hearing?: No Does the patient have  difficulty seeing, even when wearing glasses/contacts?: No Does the patient have difficulty concentrating, remembering, or making decisions?: No Patient able to express need for assistance with ADLs?: Yes Does the patient have difficulty dressing or bathing?: No Independently performs ADLs?: Yes (appropriate for developmental age) Does the patient have difficulty walking or climbing stairs?: Yes Weakness of Legs: Both Weakness of Arms/Hands: None  Permission Sought/Granted                  Emotional Assessment       Orientation: : Oriented to Self, Oriented to Place, Oriented to  Time, Oriented to Situation Alcohol / Substance Use: Not Applicable Psych Involvement: No (comment)  Admission diagnosis:  Leg edema [R60.0] CHF exacerbation (HCC) [I50.9] Diabetic ulcer of both feet ([place]) [N27.782, L97.519, L97.529] Acute on chronic congestive heart failure, unspecified heart failure type ([place]) [I50.9] Patient Active Problem List   Diagnosis Date Noted   CHF exacerbation ([place]) 05/08/2021   Diabetes mellitus without complication ([place])    Atrial fibrillation with RVR (HCC)    Obesity, Class III, BMI 40-49.9 (morbid obesity) ([place])    Venous stasis ulcer (HCC)    Sinus tachycardia    BPH (benign prostatic hyperplasia)    Chronic anticoagulation    Anasarca    Acute on chronic diastolic CHF (congestive heart failure) ([place])  Foot ulcer with fat layer exposed, left ([place])    Venous stasis of both lower extremities    AF (paroxysmal atrial fibrillation) ([place])    Sepsis due to cellulitis ([place]) 01/03/2021   Chronic osteomyelitis of left foot ([place]) 01/03/2021   Osteomyelitis ([place]) 01/03/2021   Severe sepsis ([place]) 02/24/2020   AKI (acute kidney injury) ([place]) 02/24/2020   Bilateral cellulitis of lower leg 02/23/2020   CAD (coronary artery disease) 02/02/2020   RLS (restless legs syndrome) 05/15/2019   Statin myopathy 05/15/2019   Diabetes mellitus type 2 in obese ([place]) 02/13/2016    Essential hypertension, benign 02/13/2016   PAD (peripheral artery disease) ([place]) 02/13/2016   Pressure injury of skin 01/25/2016   Hypoglycemia associated with diabetes ([place]) 12/27/2013   Long-term insulin use ([place]) 12/27/2013   Microalbuminuria 12/27/2013   B12 deficiency 09/29/2013   Obesity 09/29/2013   CAD (coronary artery disease), autologous vein bypass graft 07/20/2013   Hypersomnia with sleep apnea 07/20/2013   Pure hypercholesterolemia 07/20/2013   Osteomyelitis of ankle or foot 07/20/2011   PCP:  [name], MD Pharmacy:   [place], [place] [address] [address] Phone: [phone] Fax: [place], [place] [address] Phone: [phone] Fax: [phone]     Social Determinants of Health (SDOH) Interventions    Readmission Risk Interventions No flowsheet data found.

## 2021-05-14 NOTE — Progress Notes (Signed)
CARDIOLOGY CONSULT NOTE               Patient ID: [name]. MRN: [id number] DOB/AGE: Jun 13, 1955 66 y.o.  Admit date: 05/08/2021 Referring Physician Dr Damita Dunnings Primary Physician Dr. Doy Hutching Primary Cardiologist Dr. Bartholome Bill  Reason for Consultation heart failure  HPI: pt is a [phone] with a PMH significant for CAD s/p CABG 2006 and PCI of RCA 01/2020, HFpEF (LVEF 60 to 65% 12/2020),HTN, BPH, DM, class III obesity, PAD s/p left third toe amputation, chronic venous stasis on Unna boots,  A. fib on apixaban, amiodarone and digoxin, sleep apnea, chronic ulcer left foot, , OSA on CPAP who presents to the emergency room for evaluation of worsening bilateral lower extremity edema associated with weeping of the extremities.  He was hospitalized 12/2020 with a similar presentation of lower extremity cellulitis and had strep bacteremia at that time. Cardiology was consulted for assistance with diuresis and his heart failure.  Interval history: - Excellent UOP yesterday. Slight rise in creatinine with increase in Bicarb. - Feels good today. Swelling is improved, though he is starting to find it difficult to tell a difference day to day.  - No significant shortness of breath.   Review of systems complete and found to be negative unless listed above   Past Medical History:  Diagnosis Date   Asthma    Coronary artery disease    Depression    Diabetes mellitus without complication ([place])    Gout    History anabolic steroid use    Hyperlipidemia    Hypertension    Hypogonadism in male    Morbid obesity ([place])    Myocardial infarction ([place])    Peripheral vascular disease ([place])    Perirectal abscess    Pleurisy    Sleep apnea    CPAP at night, no oxygen   Varicella     Past Surgical History:  Procedure Laterality Date   ABDOMINAL AORTIC ANEURYSM REPAIR     ACHILLES TENDON SURGERY Left 01/10/2021   Procedure: ACHILLES LENGTHENING/KIDNER;  Surgeon: Caroline More, DPM;  Location:  [facility] ORS;  Service: Podiatry;  Laterality: Left;   AMPUTATION TOE Right 02/10/2016   Procedure: AMPUTATION TOE 3RD TOE;  Surgeon: Samara Deist, DPM;  Location: [facility] ORS;  Service: Podiatry;  Laterality: Right;   AMPUTATION TOE Left 02/24/2020   Procedure: AMPUTATION TOE;  Surgeon: Caroline More, DPM;  Location: [facility] ORS;  Service: Podiatry;  Laterality: Left;   APPLICATION OF WOUND VAC Left 02/29/2020   Procedure: APPLICATION OF WOUND VAC;  Surgeon: Caroline More, DPM;  Location: [facility] ORS;  Service: Podiatry;  Laterality: Left;   COLONOSCOPY WITH PROPOFOL [name] 11/18/2015   Procedure: COLONOSCOPY WITH PROPOFOL;  Surgeon: Manya Silvas, MD;  Location: [facility]COPY;  Service: Endoscopy;  Laterality: [name];   CORONARY ARTERY BYPASS GRAFT     CORONARY STENT INTERVENTION [name] 02/02/2020   Procedure: CORONARY STENT INTERVENTION;  Surgeon: Isaias Cowman, MD;  Location: [place] [facility];  Service: Cardiovascular;  Laterality: [name];   IRRIGATION AND DEBRIDEMENT FOOT Left 02/29/2020   Procedure: IRRIGATION AND DEBRIDEMENT FOOT;  Surgeon: Caroline More, DPM;  Location: [facility] ORS;  Service: Podiatry;  Laterality: Left;   IRRIGATION AND DEBRIDEMENT FOOT Left 02/24/2020   Procedure: IRRIGATION AND DEBRIDEMENT FOOT;  Surgeon: Caroline More, DPM;  Location: [facility] OR[facility];  Service: Podiatry;  Laterality: Left;   KNEE ARTHROSCOPY     LEFT HEART CATH AND CORS/GRAFTS ANGIOGRAPHY [name] 02/02/2020   Procedure: LEFT HEART CATH  AND CORS/GRAFTS ANGIOGRAPHY;  Surgeon: Teodoro Spray, MD;  Location: [place] CV LAB;  Service: Cardiovascular;  Laterality: [name];   LOWER EXTREMITY ANGIOGRAPHY Left 02/25/2020   Procedure: Lower Extremity Angiography;  Surgeon: Algernon Huxley, MD;  Location: [place] CV [facility];  Service: Cardiovascular;  Laterality: Left;   LOWER EXTREMITY ANGIOGRAPHY Left 01/04/2021   Procedure: LOWER EXTREMITY ANGIOGRAPHY;  Surgeon: Algernon Huxley, MD;  Location: [place] [facility] LAB;  Service:  Cardiovascular;  Laterality: Left;   METATARSAL HEAD EXCISION Left 01/10/2021   Procedure: METATARSAL HEAD EXCISION - LEFT 5th;  Surgeon: Caroline More, DPM;  Location: [facility] OR[facility];  Service: Podiatry;  Laterality: Left;   PERIPHERAL VASCULAR CATHETERIZATION Right 01/24/2016   Procedure: Lower Extremity Angiography;  Surgeon: Katha Cabal, MD;  Location: [place] [facility];  Service: Cardiovascular;  Laterality: Right;   PERIPHERAL VASCULAR CATHETERIZATION Right 01/25/2016   Procedure: Lower Extremity Angiography;  Surgeon: Katha Cabal, MD;  Location: [place] [facility];  Service: Cardiovascular;  Laterality: Right;   TOE AMPUTATION     TONSILLECTOMY      Medications Prior to Admission  Medication Sig Dispense Refill Last Dose   amiodarone (PACERONE) 200 MG tablet Take 1 tablet (200 mg total) by mouth daily.   05/08/2021 at 0830   apixaban (ELIQUIS) 5 MG TABS tablet Take 1 tablet (5 mg total) by mouth 2 (two) times daily. 60 tablet 5 05/08/2021 at 0830   digoxin 62.5 MCG TABS Take 0.0625 mg by mouth daily.   05/08/2021 at 0830   diltiazem (CARDIZEM CD) 300 MG 24 hr capsule Take 1 capsule (300 mg total) by mouth daily.   05/08/2021 at 0830   ezetimibe (ZETIA) 10 MG tablet Take 10 mg by mouth daily.   05/06/2021 at 2100   ferrous sulfate 325 (65 FE) MG tablet Take 325 mg by mouth daily with breakfast.   05/08/2021 at 0830   furosemide (LASIX) 20 MG tablet Take 40 mg by mouth daily.   05/08/2021 at 0830   gabapentin (NEURONTIN) 300 MG capsule Take 300 mg by mouth at bedtime.   05/06/2021 at 2100   insulin aspart (NOVOLOG) 100 UNIT/ML injection Inject 9 Units into the skin 3 (three) times daily with meals. (Patient taking differently: Inject 15 Units into the skin 3 (three) times daily with meals. SSI extra if BG high from 3 to 15 units) 10 mL 11 05/08/2021 at 0830   isosorbide mononitrate (IMDUR) 60 MG 24 hr tablet Take 60 mg by mouth 2 (two) times daily.   05/08/2021 at [place] 100 UNIT/ML  Solostar Pen Inject 50 Units into the skin 2 (two) times daily. (Patient taking differently: Inject 14 Units into the skin in the morning.) 15 mL 11 05/08/2021 at 0830   metFORMIN (GLUCOPHAGE) 1000 MG tablet Take 1,000 mg by mouth 2 (two) times daily.    05/08/2021 at 0830   metoprolol tartrate (LOPRESSOR) 50 MG tablet Take 50 mg by mouth 2 (two) times daily.   05/08/2021 at 0830   Multiple Vitamins-Minerals (MULTIVITAMIN WITH MINERALS) tablet Take 1 tablet by mouth daily.   05/08/2021 at 0830   nitroGLYCERIN (NITROSTAT) 0.4 MG SL tablet Place 0.4 mg under the tongue daily as needed.   prn at prn   potassium chloride (KLOR-CON) 10 MEQ tablet Take 10 mEq by mouth daily.   05/08/2021 at 0830   pramipexole (MIRAPEX) 1 MG tablet Take 2 mg by mouth at bedtime.   05/06/2021  at 2100   primidone (MYSOLINE) 250 MG tablet Take 250 mg by mouth 2 (two) times daily.   05/08/2021 at 0830   tamsulosin (FLOMAX) 0.4 MG CAPS capsule Take 0.4 mg by mouth daily.   05/08/2021 at 0830   vitamin B-12 (CYANOCOBALAMIN) 1000 MCG tablet Take 10,000 mcg by mouth daily. Energy shot   05/08/2021 at 0830   zinc gluconate 50 MG tablet Take 50 mg by mouth daily.   05/08/2021 at 0830   atorvastatin (LIPITOR) 40 MG tablet Take 1 tablet (40 mg total) by mouth daily. 30 tablet 0    metoprolol tartrate (LOPRESSOR) 25 MG tablet Take 1 tablet (25 mg total) by mouth 2 (two) times daily. (Patient not taking: Reported on 05/09/2021)   Not Taking   OZEMPIC, 0.25 OR 0.5 MG/DOSE, 2 MG/1.5ML SOPN Inject 0.25 mg into the skin once a week. (Patient not taking: Reported on 05/09/2021)   Not Taking    Social History   Socioeconomic History   Marital status: Married    Spouse name: Not on file   Number of children: Not on file   Years of education: Not on file   Highest education level: Not on file  Occupational History   Not on file  Tobacco Use   Smoking status: Former    Packs/day: 0.50    Years: 45.00    Pack years: 22.50    Types: Cigarettes    Quit  date: 04/05/2015    Years since quitting: 6.1   Smokeless tobacco: Never  Vaping Use   Vaping Use: Every day  Substance and Sexual Activity   Alcohol use: Not Currently    Alcohol/week: 3.0 standard drinks    Types: 3 Glasses of wine per week   Drug use: No   Sexual activity: Not on file  Other Topics Concern   Not on file  Social History Narrative   Not on file   Social Determinants of Health   Financial Resource Strain: Not on file  Food Insecurity: Not on file  Transportation Needs: Not on file  Physical Activity: Not on file  Stress: Not on file  Social Connections: Not on file  Intimate Partner Violence: Not on file    History reviewed. No pertinent family history.    Review of systems complete and found to be negative unless listed above   PHYSICAL EXAM General: Pleasant Caucasian male, well nourished, in no acute distress. Examined laying flat in PCU bed.  HEENT:  Normocephalic and atraumatic. Neck:  No JVD.  Lungs: Normal respiratory effort on room air. no crackles. No wheezes, rhonchi.  Heart: Tachycardic irregularly irregular rhythm. Normal S1 and S2 without gallops or murmurs. Radial pulse 2+ bilaterally. Abdomen: Obese appearing.  Msk: Normal strength and tone for age. Extremities: bilateral lower extremities wrapped in ACE bandages.  Neuro: Alert and oriented x3 Psych: Mood appropriate for situation.  Labs:   Lab Results  Component Value Date   WBC 6.3 05/12/2021   HGB 10.3 (L) 05/12/2021   HCT 33.5 (L) 05/12/2021   MCV 91.3 05/12/2021   PLT 274 05/12/2021    Recent Labs  Lab 05/08/21 1154 05/09/21 0720 05/14/21 0547  NA 139   < > 137  K 5.1   < > 4.2  CL 97*   < > 97*  CO2 32   < > 33*  BUN 20   < > 22  CREATININE 1.02   < > 1.12  CALCIUM 8.2*   < > 8.2*  PROT 7.3  --   --   BILITOT 0.8  --   --   ALKPHOS 197*  --   --   ALT 31  --   --   AST 27  --   --   GLUCOSE 209*   < > 217*   < > = values in this interval not displayed.     No results found for: CKTOTAL, CKMB, CKMBINDEX, TROPONINI No results found for: CHOL No results found for: HDL No results found for: LDLCALC No results found for: TRIG No results found for: CHOLHDL No results found for: LDLDIRECT    Radiology: DG Chest 2 View  Result Date: 05/08/2021 CLINICAL DATA:  Sepsis. EXAM: CHEST - 2 VIEW COMPARISON:  January 16, 2021. FINDINGS: Status post coronary bypass graft. Stable cardiomegaly with central pulmonary vascular congestion is noted. Mild bilateral pulmonary edema may be present. No consolidative process is noted. Bony thorax is unremarkable. IMPRESSION: Stable cardiomegaly with mild central pulmonary vascular congestion and probable mild bilateral pulmonary edema. Electronically Signed   By: Marijo Conception M.D.   On: 05/08/2021 12:31   US Venous Img Lower Bilateral  Result Date: 05/08/2021 CLINICAL DATA:  Pain and swelling EXAM: Bilateral LOWER EXTREMITY VENOUS DOPPLER ULTRASOUND TECHNIQUE: Gray-scale sonography with compression, as well as color and duplex ultrasound, were performed to evaluate the deep venous system(s) from the level of the common femoral vein through the popliteal and proximal calf veins. COMPARISON:  None. FINDINGS: VENOUS Normal compressibility of the common femoral, superficial femoral, and popliteal veins, as well as the visualized calf veins. Visualized portions of profunda femoral vein and great saphenous vein unremarkable. No filling defects to suggest DVT on grayscale or color Doppler imaging. Doppler waveforms show normal direction of venous flow, normal respiratory plasticity and response to augmentation. OTHER None. Limitations: Edema and habitus. IMPRESSION: Negative. Electronically Signed   By: Donavan Foil M.D.   On: 05/08/2021 21:19   DG Foot Complete Left  Result Date: 05/08/2021 CLINICAL DATA:  Bilateral foot wound EXAM: LEFT FOOT - COMPLETE 3+ VIEW COMPARISON:  01/10/2021 FINDINGS: No acute fracture is seen.  Previous trans meta tarsal amputation third digit and partial amputation of fifth metatarsal. Cut margins appear smooth. No bony destructive change. No soft tissue emphysema. IMPRESSION: No acute osseous abnormality. Previous transmetatarsal amputation third digit and partial amputation of fifth metatarsal. Electronically Signed   By: Donavan Foil M.D.   On: 05/08/2021 23:06   DG Foot Complete Right  Result Date: 05/08/2021 CLINICAL DATA:  Foot wound EXAM: RIGHT FOOT COMPLETE - 3+ VIEW COMPARISON:  None. FINDINGS: No fracture is seen. Prior transmetatarsal amputation second digit and previous amputation of third digit at the level of the MTP joint. Smooth cut margins. Irregular arthropathy with erosive changes at the first IP joint of uncertain chronicity. There is soft tissue swelling. Negative for soft tissue emphysema. IMPRESSION: 1. Previous partial amputation of the second and third digits without acute findings here 2. Irregular arthropathy at the first IP joint with erosive changes of uncertain chronicity, correlate for signs of active infection here. Evaluation with MRI may be obtained as indicated. Electronically Signed   By: Donavan Foil M.D.   On: 05/08/2021 23:09   ECHOCARDIOGRAM COMPLETE  Result Date: 05/10/2021    ECHOCARDIOGRAM REPORT   Patient Name:   Jalene Lacko. Date of Exam: 05/10/2021 Medical Rec #:  [id number]             Height:  76.0 in Accession #:    [id number]            Weight:       350.0 lb Date of Birth:  January 15, 1956            BSA:          2.810 m Patient Age:    64 years              BP:           114/65 mmHg Patient Gender: M                     HR:           118 bpm. Exam Location:  [facility] Procedure: 2D Echo, Cardiac Doppler and Color Doppler Indications:     CHF I50.9  History:         Patient has prior history of Echocardiogram examinations, most                  recent 01/04/2021. Previous Myocardial Infarction; Risk                  Factors:Hypertension and  Diabetes.  Sonographer:     [name] Sport Referring Phys:  [id number] [place] TANG Diagnosing Phys: Donnelly Angelica  Sonographer Comments: Technically challenging study due to limited acoustic windows, suboptimal parasternal window, no apical window and no subcostal window. IMPRESSIONS  1. Left ventricular ejection fraction, by estimation, is 50 to 55%. The left ventricle has low normal function. The left ventricle has no regional wall motion abnormalities. There is moderate left ventricular hypertrophy. Left ventricular diastolic function could not be evaluated.  2. Right ventricular systolic function was not well visualized. The right ventricular size is not well visualized.  3. The mitral valve is normal in structure. No evidence of mitral valve regurgitation.  4. The aortic valve is normal in structure. Aortic valve regurgitation is not visualized. Aortic valve sclerosis is present, with no evidence of aortic valve stenosis. FINDINGS  Left Ventricle: Left ventricular ejection fraction, by estimation, is 50 to 55%. The left ventricle has low normal function. The left ventricle has no regional wall motion abnormalities. The left ventricular internal cavity size was normal in size. There is moderate left ventricular hypertrophy. Left ventricular diastolic function could not be evaluated. Right Ventricle: The right ventricular size is not well visualized. Right vetricular wall thickness was not well visualized. Right ventricular systolic function was not well visualized. Left Atrium: Left atrial size was not well visualized. Right Atrium: Right atrial size was not well visualized. Pericardium: There is no evidence of pericardial effusion. Mitral Valve: The mitral valve is normal in structure. Mild mitral annular calcification. No evidence of mitral valve regurgitation. Tricuspid Valve: The tricuspid valve is normal in structure. Tricuspid valve regurgitation is not demonstrated. Aortic Valve: The aortic valve is normal  in structure. Aortic valve regurgitation is not visualized. Aortic valve sclerosis is present, with no evidence of aortic valve stenosis. Pulmonic Valve: The pulmonic valve was not well visualized. Pulmonic valve regurgitation is not visualized. No evidence of pulmonic stenosis. Aorta: The aortic root is normal in size and structure. Venous: The inferior vena cava was not well visualized. IAS/Shunts: The interatrial septum was not well visualized.  LEFT VENTRICLE PLAX 2D LVIDd:         5.80 cm LVIDs:         3.90 cm LV PW:  1.20 cm LV IVS:        1.60 cm LVOT diam:     2.00 cm LVOT Area:     3.14 cm  LEFT ATRIUM         Index LA diam:    5.10 cm 1.81 cm/m                        PULMONIC VALVE AORTA                 PV Vmax:        0.48 m/s Ao Root diam: 3.20 cm PV Vmean:       28.700 cm/s                       PV VTI:         0.057 m                       PV Peak grad:   0.9 mmHg                       PV Mean grad:   0.0 mmHg                       RVOT Peak grad: 1 mmHg   SHUNTS Systemic Diam: 2.00 cm Pulmonic VTI:  0.077 m Donnelly Angelica Electronically signed by Donnelly Angelica Signature Date/Time: 05/10/2021/9:11:30 AM    Final     ECHO LVEF 60 to 65% 12/2020  TELEMETRY reviewed by me: atrial flutter 119  EKG reviewed by me: atrial flutter with 2:1 conduction 123  ASSESSMENT AND PLAN:  65yoM with a PMH significant for CAD s/p CABG 2006 and PCI of RCA 01/2020, HFpEF (LVEF 60 to 65% 12/2020), HTN, BPH, diabetes type 2, class III obesity, PAD s/p left third toe amputation, chronic venous stasis on Unna boots,  A. fib on apixaban, amiodarone and digoxin, sleep apnea, chronic ulcer left foot, OSA on CPAP who presents to the emergency room for evaluation of worsening bilateral lower extremity edema associated with weeping of the extremities.  Cardiology was consulted for assistance with diuresis and his heart failure.  #Acute on chronic HFpEF (LVEF 50-55% 05/10/2020) -clinically improving, off O2 since 1/12.   -Diuresed very well on lasix 60 mg IV BID through 05/13/20. - Contraction alkalosis developing. Transition to PO lasix 60 daily.  -renal function stable.  -strongly reiterated fluid restrictions to 64 ounces per day, monitoring salt intake.  -Monitor I's and O's while inpatient. - Possibly ready for discharge tomorrow from a cardiology perspective.   #PAD s/p left third toe amputation and right toe amputations #Chronic lower extremity wounds associated with venous stasis -Appreciate vascular surgery, podiatry, and wound care recommendations -on cefepime day 3 and vancomycin day 2. -completed ~24hrs of flagyl 1/11  #Paroxysmal Atrial fibrillation on eliquis CHA2DS2-VASc 5. -remains in atrial flutter with rate around ~110. May consider DCCV if persistent in the future.  -continue PO amiodarone 200mg  BID today.  -Continue metoprolol tartrate at 50mg  BID   -Continue digoxin 0.0625 mg once daily, Cardizem CD 300 mg -Continue eliquis 5mg  BID.   #CAD s/p CABG 2006 and PCI of RCA 01/2020 #HTN #Hyperlipidemia -Continue Imdur -Continue Zetia -Would recommend high intensity statin therapy however patient has reported allergy of myalgias to statins -Plan as above  #type 2 diabetes Poorly controlled. A1c on admission 9.2, mgmt  per primary team   Signed: Andrez Grime , MD 05/14/2021, 8:09 AM

## 2021-05-14 NOTE — Progress Notes (Signed)
PROGRESS NOTE    [name].  ENI:[id number] DOB: [birth year] DOA: 05/08/2021 PCP: [name], MD     Brief Narrative:  Alec Less. is a 66 y.o. male with medical history significant for CAD s/p CABG 2006 and PCI of RCA 12/2020 and PCI, HTN, BPH, DM, class III obesity, PAD, A. fib on apixaban, amiodarone and digoxin, sleep apnea, chronic ulcer left foot, PAD s/p left first ray amputation, chronic venous stasis on Unna boots, chronic ulcer left foot, diastolic CHF last EF 60 to 65% 12/2020, OSA on CPAP who presents to the emergency room for evaluation of worsening bilateral lower extremity edema associated with weeping of the extremities as well as dyspnea on exertion.  Patient was hospitalized in September 2022 for cellulitis of chronic left foot ulcer and found to have streptococcal bacteremia at that time.  LLE angiogram showed no significant stenosis.  He is followed both by vascular surgery and podiatry.  New events last 24 hours / Subjective: Doing well without complaints of SOB or CP. UOP 6314ml recorded yesterday. Edema improving.   Assessment & Plan:   Principal Problem:   Anasarca Active Problems:   Essential hypertension, benign   PAD (peripheral artery disease) (HCC)   Bilateral cellulitis of lower leg   CAD (coronary artery disease), autologous vein bypass graft   Hypersomnia with sleep apnea   Diabetes mellitus without complication (HCC)   Obesity, Class III, BMI 40-49.9 (morbid obesity) (HCC)   BPH (benign prostatic hyperplasia)   Chronic anticoagulation   Acute on chronic diastolic CHF (congestive heart failure) (HCC)   Foot ulcer with fat layer exposed, left (HCC)   Venous stasis of both lower extremities   AF (paroxysmal atrial fibrillation) (HCC)   CHF exacerbation (HCC)   Bilateral lower extremity cellulitis, venous stasis ulcers, PAD -Appreciate vascular surgery and podiatry -Vascular surgery holding off on angiogram at this  time -Status post bedside debridement by Dr. Luana Shu, podiatry, 1/11.  He will need to follow-up with wound care center.  Nonweightbearing to left heel, surgical shoe was ordered -Vancomycin, cefepime, Flagyl --> Augmentin for 7 days  -Aspirin   Staph hominis bacteremia -This is likely to be a contaminant as only seen 1 of 4 blood culture bottles -On antibiotics as above regardless  Acute on chronic diastolic HF  -Repeat echocardiogram showed EF 50 to 55% -Cardiology following -Remains volume overloaded but improving, good UOP -Lasix switched to PO today   A. fib with RVR -Continue amiodarone, diltiazem, digoxin, metoprolol, Eliquis  Diabetes mellitus type 2, uncontrolled with hyperglycemia -A1c 9.2 -Continue Semglee, NovoLog, sliding scale insulin. Dose of novolog increased today due to hyperglycemia   CAD status post CABG -Continue aspirin, atorvastatin, Imdur  RLS -Continue pramipexole, primidone  BPH -Continue Flomax  OSA -CPAP nightly  DVT prophylaxis:  apixaban (ELIQUIS) tablet 5 mg  Code Status: Full code  Family Communication: None at bedside  Disposition Plan:  Status is: Inpatient  Remains inpatient appropriate because: diuretic dose adjustments. Hopeful dc home 1/16    Consultants:  Cardiology Podiatry Vascular surgery   Procedures:  None   Antimicrobials:  Anti-infectives (From admission, onward)    Start     Dose/Rate Route Frequency Ordered Stop   05/11/21 1330  amoxicillin-clavulanate (AUGMENTIN) 875-125 MG per tablet 1 tablet        1 tablet Oral 2 times daily 05/11/21 1241 05/18/21 0959   05/10/21 2200  metroNIDAZOLE (FLAGYL) tablet 500 mg  Status:  Discontinued  500 mg Oral Every 12 hours 05/10/21 1238 05/11/21 1241   05/09/21 1400  vancomycin (VANCOREADY) IVPB 1500 mg/300 mL  Status:  Discontinued        1,500 mg 150 mL/hr over 120 Minutes Intravenous Every 12 hours 05/09/21 0106 05/11/21 1241   05/09/21 1300  vancomycin  (VANCOREADY) IVPB 1500 mg/300 mL  Status:  Discontinued        1,500 mg 150 mL/hr over 120 Minutes Intravenous Every 12 hours 05/09/21 0104 05/09/21 0106   05/09/21 0830  metroNIDAZOLE (FLAGYL) IVPB 500 mg  Status:  Discontinued        500 mg 100 mL/hr over 60 Minutes Intravenous Every 12 hours 05/09/21 0828 05/10/21 1238   05/09/21 0600  ceFEPIme (MAXIPIME) 2 g in sodium chloride 0.9 % 100 mL IVPB  Status:  Discontinued        2 g 200 mL/hr over 30 Minutes Intravenous Every 8 hours 05/09/21 0050 05/09/21 0105   05/09/21 0600  ceFEPIme (MAXIPIME) 2 g in sodium chloride 0.9 % 100 mL IVPB  Status:  Discontinued        2 g 200 mL/hr over 30 Minutes Intravenous Every 8 hours 05/09/21 0105 05/11/21 1241   05/09/21 0000  vancomycin (VANCOCIN) IVPB 1000 mg/200 mL premix       See Hyperspace for full Linked Orders Report.   1,000 mg 200 mL/hr over 60 Minutes Intravenous  Once 05/08/21 2303 05/09/21 0625   05/09/21 0000  vancomycin (VANCOREADY) IVPB 1500 mg/300 mL       See Hyperspace for full Linked Orders Report.   1,500 mg 150 mL/hr over 120 Minutes Intravenous  Once 05/08/21 2303 05/09/21 0257   05/08/21 2315  ceFEPIme (MAXIPIME) 2 g in sodium chloride 0.9 % 100 mL IVPB        2 g 200 mL/hr over 30 Minutes Intravenous  Once 05/08/21 2303 05/09/21 0030        Objective: Vitals:   05/14/21 0109 05/14/21 0404 05/14/21 0500 05/14/21 0753  BP: 120/84 105/68  123/90  Pulse: (!) 108 (!) 108  (!) 108  Resp: 20 19  19   Temp: 97.7 F (36.5 C) 97.7 F (36.5 C)  98 F (36.7 C)  TempSrc:      SpO2: 94% 93%  95%  Weight:   (!) 180.8 kg   Height:        Intake/Output Summary (Last 24 hours) at 05/14/2021 1023 Last data filed at 05/14/2021 0950 Gross per 24 hour  Intake 960 ml  Output 6375 ml  Net -5415 ml    Filed Weights   05/12/21 0428 05/13/21 0500 05/14/21 0500  Weight: (!) 185.5 kg (!) 182.8 kg (!) 180.8 kg    Examination: General exam: Appears calm and comfortable   Respiratory system: Clear to auscultation. Respiratory effort normal.  On room air Cardiovascular system: S1 & S2 heard, tachycardic, regular rhythm.  +Bilateral pedal edema, softer today  Gastrointestinal system: Abdomen is nondistended, soft and nontender. Normal bowel sounds heard. Central nervous system: Alert and oriented. Non focal exam. Speech clear  Extremities: Bilateral lower extremities in Unna boots Psychiatry: Judgement and insight appear stable. Mood & affect appropriate.    Data Reviewed: I have personally reviewed following labs and imaging studies  CBC: Recent Labs  Lab 05/08/21 1154 05/10/21 0656 05/12/21 0625  WBC 8.8 7.0 6.3  NEUTROABS 6.6  --   --   HGB 11.7* 11.3* 10.3*  HCT 39.2 37.0* 33.5*  MCV 95.6 93.7 91.3  PLT 289 281 329    Basic Metabolic Panel: Recent Labs  Lab 05/10/21 0656 05/11/21 0644 05/12/21 0625 05/13/21 0440 05/14/21 0547  NA 138 137 136 138 137  K 4.3 4.0 3.9 3.9 4.2  CL 100 100 100 99 97*  CO2 31 33* 31 32 33*  GLUCOSE 192* 301* 114* 114* 217*  BUN 23 20 22  24* 22  CREATININE 1.14 1.01 1.05 1.07 1.12  CALCIUM 8.2* 8.0* 8.0* 8.2* 8.2*  MG  --  2.0  --   --   --     GFR: Estimated Creatinine Clearance: 114.2 mL/min (by C-G formula based on SCr of 1.12 mg/dL). Liver Function Tests: Recent Labs  Lab 05/08/21 1154  AST 27  ALT 31  ALKPHOS 197*  BILITOT 0.8  PROT 7.3  ALBUMIN 2.9*    No results for input(s): LIPASE, AMYLASE in the last 168 hours. No results for input(s): AMMONIA in the last 168 hours. Coagulation Profile: Recent Labs  Lab 05/08/21 1154  INR 1.2    Cardiac Enzymes: No results for input(s): CKTOTAL, CKMB, CKMBINDEX, TROPONINI in the last 168 hours. BNP (last 3 results) No results for input(s): PROBNP in the last 8760 hours. HbA1C: No results for input(s): HGBA1C in the last 72 hours.  CBG: Recent Labs  Lab 05/13/21 1201 05/13/21 1619 05/13/21 1749 05/13/21 2019 05/14/21 0750  GLUCAP  124* 216* 201* 306* 203*    Lipid Profile: No results for input(s): CHOL, HDL, LDLCALC, TRIG, CHOLHDL, LDLDIRECT in the last 72 hours. Thyroid Function Tests: No results for input(s): TSH, T4TOTAL, FREET4, T3FREE, THYROIDAB in the last 72 hours. Anemia Panel: No results for input(s): VITAMINB12, FOLATE, FERRITIN, TIBC, IRON, RETICCTPCT in the last 72 hours. Sepsis Labs: Recent Labs  Lab 05/08/21 1154 05/08/21 1258  LATICACIDVEN 2.3* 1.5     Recent Results (from the past 240 hour(s))  Culture, blood (Routine x 2)     Status: None   Collection Time: 05/08/21 11:54 AM   Specimen: BLOOD  Result Value Ref Range Status   Specimen Description BLOOD LEFT ANTECUBITAL  Final   Special Requests   Final    BOTTLES DRAWN AEROBIC AND ANAEROBIC Blood Culture adequate volume   Culture   Final    NO GROWTH 5 DAYS Performed at [facility], [address], [place], [place] [address]    Report Status 05/13/2021 FINAL  Final  Culture, blood (Routine x 2)     Status: Abnormal   Collection Time: 05/08/21 12:56 PM   Specimen: BLOOD  Result Value Ref Range Status   Specimen Description   Final    BLOOD BLOOD LEFT FOREARM Performed at [facility], [address], [place], [place] [address]    Special Requests   Final    BOTTLES DRAWN AEROBIC AND ANAEROBIC Blood Culture adequate volume Performed at [facility], [address] [place], [place] [address]    Culture  Setup Time   Final    GRAM POSITIVE COCCI Organism ID to follow AEROBIC BOTTLE ONLY CRITICAL RESULT CALLED TO, READ BACK BY AND VERIFIED WITH: LISA KOUTZZ 05/09/21 0706 Performed at [facility], [address] [place], [place] [address]    Culture (A)  Final    STAPHYLOCOCCUS HOMINIS THE SIGNIFICANCE OF ISOLATING THIS ORGANISM FROM A SINGLE SET OF BLOOD CULTURES WHEN MULTIPLE SETS ARE DRAWN IS UNCERTAIN. PLEASE NOTIFY THE MICROBIOLOGY DEPARTMENT WITHIN ONE WEEK IF SPECIATION AND  SENSITIVITIES ARE REQUIRED. Performed at [facility], [address] [place], [place] [address]    Report Status 05/11/2021 FINAL  Final  Blood Culture ID Panel (Reflexed)     Status: Abnormal   Collection Time: 05/08/21 12:56 PM  Result Value Ref Range Status   Enterococcus faecalis NOT DETECTED NOT DETECTED Final   Enterococcus Faecium NOT DETECTED NOT DETECTED Final   Listeria monocytogenes NOT DETECTED NOT DETECTED Final   Staphylococcus species DETECTED (A) NOT DETECTED Final    Comment: CRITICAL RESULT CALLED TO, READ BACK BY AND VERIFIED WITH: LISA KOTZZ 05/09/21 0707 MW    Staphylococcus aureus (BCID) NOT DETECTED NOT DETECTED Final   Staphylococcus epidermidis NOT DETECTED NOT DETECTED Final   Staphylococcus lugdunensis NOT DETECTED NOT DETECTED Final   Streptococcus species NOT DETECTED NOT DETECTED Final   Streptococcus agalactiae NOT DETECTED NOT DETECTED Final   Streptococcus pneumoniae NOT DETECTED NOT DETECTED Final   Streptococcus pyogenes NOT DETECTED NOT DETECTED Final   A.calcoaceticus-baumannii NOT DETECTED NOT DETECTED Final   Bacteroides fragilis NOT DETECTED NOT DETECTED Final   Enterobacterales NOT DETECTED NOT DETECTED Final   Enterobacter cloacae complex NOT DETECTED NOT DETECTED Final   Escherichia coli NOT DETECTED NOT DETECTED Final   Klebsiella aerogenes NOT DETECTED NOT DETECTED Final   Klebsiella oxytoca NOT DETECTED NOT DETECTED Final   Klebsiella pneumoniae NOT DETECTED NOT DETECTED Final   Proteus species NOT DETECTED NOT DETECTED Final   Salmonella species NOT DETECTED NOT DETECTED Final   Serratia marcescens NOT DETECTED NOT DETECTED Final   Haemophilus influenzae NOT DETECTED NOT DETECTED Final   Neisseria meningitidis NOT DETECTED NOT DETECTED Final   Pseudomonas aeruginosa NOT DETECTED NOT DETECTED Final   Stenotrophomonas maltophilia NOT DETECTED NOT DETECTED Final   Candida albicans NOT DETECTED NOT DETECTED Final   Candida auris  NOT DETECTED NOT DETECTED Final   Candida glabrata NOT DETECTED NOT DETECTED Final   Candida krusei NOT DETECTED NOT DETECTED Final   Candida parapsilosis NOT DETECTED NOT DETECTED Final   Candida tropicalis NOT DETECTED NOT DETECTED Final   Cryptococcus neoformans/gattii NOT DETECTED NOT DETECTED Final    Comment: Performed at [facility], [address] [place], [place] [address]  Resp Panel by RT-PCR (Flu A&B, Covid) Nasopharyngeal Swab     Status: None   Collection Time: 05/09/21  9:50 AM   Specimen: Nasopharyngeal Swab; Nasopharyngeal(NP) swabs in vial transport medium  Result Value Ref Range Status   SARS Coronavirus 2 by RT PCR NEGATIVE NEGATIVE Final    Comment: (NOTE) SARS-CoV-2 target nucleic acids are NOT DETECTED.  The SARS-CoV-2 RNA is generally detectable in upper respiratory specimens during the acute phase of infection. The lowest concentration of SARS-CoV-2 viral copies this assay can detect is 138 copies/mL. A negative result does not preclude SARS-Cov-2 infection and should not be used as the sole basis for treatment or other patient management decisions. A negative result may occur with  improper specimen collection/handling, submission of specimen other than nasopharyngeal swab, presence of viral mutation(s) within the areas targeted by this assay, and inadequate number of viral copies(<138 copies/mL). A negative result must be combined with clinical observations, patient history, and epidemiological information. The expected result is Negative.  Fact Sheet for Patients:  EntrepreneurPulse.com.au  Fact Sheet for Healthcare Providers:  IncredibleEmployment.be  This test is no t yet approved or cleared by the [place] FDA and  has been authorized for detection and/or diagnosis of SARS-CoV-2 by FDA under an Emergency Use Authorization (EUA). This EUA  will remain  in effect (meaning this test can be used) for  the duration of the COVID-19 declaration under Section 564(b)(1) of the Act, 21 U.S.C.section 360bbb-3(b)(1), unless the authorization is terminated  or revoked sooner.       Influenza A by PCR NEGATIVE NEGATIVE Final   Influenza B by PCR NEGATIVE NEGATIVE Final    Comment: (NOTE) The Xpert Xpress SARS-CoV-2/FLU/RSV plus assay is intended as an aid in the diagnosis of influenza from Nasopharyngeal swab specimens and should not be used as a sole basis for treatment. Nasal washings and aspirates are unacceptable for Xpert Xpress SARS-CoV-2/FLU/RSV testing.  Fact Sheet for Patients: EntrepreneurPulse.com.au  Fact Sheet for Healthcare Providers: IncredibleEmployment.be  This test is not yet approved or cleared by the [place] FDA and has been authorized for detection and/or diagnosis of SARS-CoV-2 by FDA under an Emergency Use Authorization (EUA). This EUA will remain in effect (meaning this test can be used) for the duration of the COVID-19 declaration under Section 564(b)(1) of the Act, 21 U.S.C. section 360bbb-3(b)(1), unless the authorization is terminated or revoked.  Performed at [facility], [address], [place], [place] [address]        Radiology Studies: No results found.    Scheduled Meds:  amiodarone  200 mg Oral BID   amoxicillin-clavulanate  1 tablet Oral BID   apixaban  5 mg Oral BID   aspirin EC  81 mg Oral Daily   atorvastatin  40 mg Oral Daily   collagenase   Topical Daily   digoxin  0.0625 mg Oral Daily   diltiazem  300 mg Oral Daily   ezetimibe  10 mg Oral Daily   furosemide  60 mg Oral Daily   gabapentin  300 mg Oral Daily   triamcinolone cream   Topical Once per day on Mon Thu   And   hydrocerin  1 application Topical Once per day on Mon Thu   insulin aspart  0-20 Units Subcutaneous TID WC   insulin aspart  0-5 Units Subcutaneous QHS   insulin aspart  6 Units Subcutaneous TID WC    insulin glargine-yfgn  55 Units Subcutaneous BID   isosorbide mononitrate  60 mg Oral BID   metoprolol tartrate  50 mg Oral BID   pramipexole  2 mg Oral QHS   primidone  250 mg Oral BID   sodium chloride flush  3 mL Intravenous Q12H   tamsulosin  0.4 mg Oral Daily   Continuous Infusions:  sodium chloride Stopped (05/11/21 1542)     LOS: 6 days     Dessa Phi, DO [facility] 05/14/2021, 10:23 AM   Available via Epic secure chat 7am-7pm After these hours, please refer to coverage provider listed on [email or web address]

## 2021-05-15 LAB — BASIC METABOLIC PANEL
Anion gap: 7 (ref 5–15)
BUN: 24 mg/dL — ABNORMAL HIGH (ref 8–23)
CO2: 32 mmol/L (ref 22–32)
Calcium: 8.3 mg/dL — ABNORMAL LOW (ref 8.9–10.3)
Chloride: 99 mmol/L (ref 98–111)
Creatinine, Ser: 0.98 mg/dL (ref 0.61–1.24)
GFR, Estimated: >60 mL/min (ref >60)
Glucose, Bld: 79 mg/dL (ref 70–99)
Potassium: 4 mmol/L (ref 3.5–5.1)
Sodium: 138 mmol/L (ref 135–145)

## 2021-05-15 LAB — CBC
HCT: 33.7 % — ABNORMAL LOW (ref 39.0–52.0)
Hemoglobin: 10.5 g/dL — ABNORMAL LOW (ref 13.0–17.0)
MCH: 28.2 pg (ref 26.0–34.0)
MCHC: 31.2 g/dL (ref 30.0–36.0)
MCV: 90.6 fL (ref 80.0–100.0)
Platelets: 262 10*3/uL (ref 150–400)
RBC: 3.72 MIL/uL — ABNORMAL LOW (ref 4.22–5.81)
RDW: 15.1 % (ref 11.5–15.5)
WBC: 6.3 10*3/uL (ref 4.0–10.5)
nRBC: 0 % (ref 0.0–0.2)

## 2021-05-15 LAB — GLUCOSE, CAPILLARY
Glucose-Capillary: 138 mg/dL — ABNORMAL HIGH (ref 70–99)
Glucose-Capillary: 101 mg/dL — ABNORMAL HIGH (ref 70–99)
Glucose-Capillary: 176 mg/dL — ABNORMAL HIGH (ref 70–99)
Glucose-Capillary: 245 mg/dL — ABNORMAL HIGH (ref 70–99)

## 2021-05-15 NOTE — Progress Notes (Signed)
CARDIOLOGY CONSULT NOTE               Patient ID: [name]. MRN: [id number] DOB/AGE: 1956/04/20 66 y.o.  Admit date: 05/08/2021 Referring Physician Dr Damita Dunnings Primary Physician Dr. Doy Hutching Primary Cardiologist Dr. Bartholome Bill  Reason for Consultation heart failure  HPI: pt is a [phone] with a PMH significant for CAD s/p CABG 2006 and PCI of RCA 01/2020, HFpEF (LVEF 60 to 65% 12/2020),HTN, BPH, DM, class III obesity, PAD s/p left third toe amputation, chronic venous stasis on Unna boots,  A. fib on apixaban, amiodarone and digoxin, sleep apnea, chronic ulcer left foot, , OSA on CPAP who presents to the emergency room for evaluation of worsening bilateral lower extremity edema associated with weeping of the extremities.  He was hospitalized 12/2020 with a similar presentation of lower extremity cellulitis and had strep bacteremia at that time. Cardiology was consulted for assistance with diuresis and his heart failure.  Interval history: - Did well with PO lasix yesterday.  - Feels good this morning. Denies shortness of breath or chest pain.   Review of systems complete and found to be negative unless listed above   Past Medical History:  Diagnosis Date   Asthma    Coronary artery disease    Depression    Diabetes mellitus without complication ([place])    Gout    History anabolic steroid use    Hyperlipidemia    Hypertension    Hypogonadism in male    Morbid obesity ([place])    Myocardial infarction ([place])    Peripheral vascular disease ([place])    Perirectal abscess    Pleurisy    Sleep apnea    CPAP at night, no oxygen   Varicella     Past Surgical History:  Procedure Laterality Date   ABDOMINAL AORTIC ANEURYSM REPAIR     ACHILLES TENDON SURGERY Left 01/10/2021   Procedure: ACHILLES LENGTHENING/KIDNER;  Surgeon: Caroline More, DPM;  Location: [facility];  Service: Podiatry;  Laterality: Left;   AMPUTATION TOE Right 02/10/2016   Procedure: AMPUTATION TOE 3RD TOE;   Surgeon: Samara Deist, DPM;  Location: [facility];  Service: Podiatry;  Laterality: Right;   AMPUTATION TOE Left 02/24/2020   Procedure: AMPUTATION TOE;  Surgeon: Caroline More, DPM;  Location: [facility];  Service: Podiatry;  Laterality: Left;   APPLICATION OF WOUND VAC Left 02/29/2020   Procedure: APPLICATION OF WOUND VAC;  Surgeon: Caroline More, DPM;  Location: [facility];  Service: Podiatry;  Laterality: Left;   COLONOSCOPY WITH PROPOFOL [name] 11/18/2015   Procedure: COLONOSCOPY WITH PROPOFOL;  Surgeon: Manya Silvas, MD;  Location: [facility] ENDOSCOPY;  Service: Endoscopy;  Laterality: [name];   CORONARY ARTERY BYPASS GRAFT     CORONARY STENT INTERVENTION [name] 02/02/2020   Procedure: CORONARY STENT INTERVENTION;  Surgeon: Isaias Cowman, MD;  Location: [place] [facility];  Service: Cardiovascular;  Laterality: [name];   IRRIGATION AND DEBRIDEMENT FOOT Left 02/29/2020   Procedure: IRRIGATION AND DEBRIDEMENT FOOT;  Surgeon: Caroline More, DPM;  Location: [facility];  Service: Podiatry;  Laterality: Left;   IRRIGATION AND DEBRIDEMENT FOOT Left 02/24/2020   Procedure: IRRIGATION AND DEBRIDEMENT FOOT;  Surgeon: Caroline More, DPM;  Location: [facility];  Service: Podiatry;  Laterality: Left;   KNEE ARTHROSCOPY     LEFT HEART CATH AND CORS/GRAFTS ANGIOGRAPHY [name] 02/02/2020   Procedure: LEFT HEART CATH AND CORS/GRAFTS ANGIOGRAPHY;  Surgeon: Teodoro Spray, MD;  Location: [place] [facility];  Service: Cardiovascular;  Laterality: [name];  LOWER EXTREMITY ANGIOGRAPHY Left 02/25/2020   Procedure: Lower Extremity Angiography;  Surgeon: Algernon Huxley, MD;  Location: [place] [facility];  Service: Cardiovascular;  Laterality: Left;   LOWER EXTREMITY ANGIOGRAPHY Left 01/04/2021   Procedure: LOWER EXTREMITY ANGIOGRAPHY;  Surgeon: Algernon Huxley, MD;  Location: [place] [facility];  Service: Cardiovascular;  Laterality: Left;   METATARSAL HEAD EXCISION Left 01/10/2021   Procedure: METATARSAL HEAD EXCISION - LEFT 5th;   Surgeon: Caroline More, DPM;  Location: [facility];  Service: Podiatry;  Laterality: Left;   PERIPHERAL VASCULAR CATHETERIZATION Right 01/24/2016   Procedure: Lower Extremity Angiography;  Surgeon: Katha Cabal, MD;  Location: [place] [facility];  Service: Cardiovascular;  Laterality: Right;   PERIPHERAL VASCULAR CATHETERIZATION Right 01/25/2016   Procedure: Lower Extremity Angiography;  Surgeon: Katha Cabal, MD;  Location: [place] [facility];  Service: Cardiovascular;  Laterality: Right;   TOE AMPUTATION     TONSILLECTOMY      Medications Prior to Admission  Medication Sig Dispense Refill Last Dose   amiodarone (PACERONE) 200 MG tablet Take 1 tablet (200 mg total) by mouth daily.   05/08/2021 at 0830   apixaban (ELIQUIS) 5 MG TABS tablet Take 1 tablet (5 mg total) by mouth 2 (two) times daily. 60 tablet 5 05/08/2021 at 0830   digoxin 62.5 MCG TABS Take 0.0625 mg by mouth daily.   05/08/2021 at 0830   diltiazem (CARDIZEM CD) 300 MG 24 hr capsule Take 1 capsule (300 mg total) by mouth daily.   05/08/2021 at 0830   ezetimibe (ZETIA) 10 MG tablet Take 10 mg by mouth daily.   05/06/2021 at 2100   ferrous sulfate 325 (65 FE) MG tablet Take 325 mg by mouth daily with breakfast.   05/08/2021 at 0830   furosemide (LASIX) 20 MG tablet Take 40 mg by mouth daily.   05/08/2021 at 0830   gabapentin (NEURONTIN) 300 MG capsule Take 300 mg by mouth at bedtime.   05/06/2021 at 2100   insulin aspart (NOVOLOG) 100 UNIT/ML injection Inject 9 Units into the skin 3 (three) times daily with meals. (Patient taking differently: Inject 15 Units into the skin 3 (three) times daily with meals. SSI extra if BG high from 3 to 15 units) 10 mL 11 05/08/2021 at 0830   isosorbide mononitrate (IMDUR) 60 MG 24 hr tablet Take 60 mg by mouth 2 (two) times daily.   05/08/2021 at [place] 100 UNIT/ML Solostar Pen Inject 50 Units into the skin 2 (two) times daily. (Patient taking differently: Inject 14 Units into the skin in the  morning.) 15 mL 11 05/08/2021 at 0830   metFORMIN (GLUCOPHAGE) 1000 MG tablet Take 1,000 mg by mouth 2 (two) times daily.    05/08/2021 at 0830   metoprolol tartrate (LOPRESSOR) 50 MG tablet Take 50 mg by mouth 2 (two) times daily.   05/08/2021 at 0830   Multiple Vitamins-Minerals (MULTIVITAMIN WITH MINERALS) tablet Take 1 tablet by mouth daily.   05/08/2021 at 0830   nitroGLYCERIN (NITROSTAT) 0.4 MG SL tablet Place 0.4 mg under the tongue daily as needed.   prn at prn   potassium chloride (KLOR-CON) 10 MEQ tablet Take 10 mEq by mouth daily.   05/08/2021 at 0830   pramipexole (MIRAPEX) 1 MG tablet Take 2 mg by mouth at bedtime.   05/06/2021 at 2100   primidone (MYSOLINE) 250 MG tablet Take 250 mg by mouth 2 (two) times daily.   05/08/2021 at 0830  tamsulosin (FLOMAX) 0.4 MG CAPS capsule Take 0.4 mg by mouth daily.   05/08/2021 at 0830   vitamin B-12 (CYANOCOBALAMIN) 1000 MCG tablet Take 10,000 mcg by mouth daily. Energy shot   05/08/2021 at 0830   zinc gluconate 50 MG tablet Take 50 mg by mouth daily.   05/08/2021 at 0830   atorvastatin (LIPITOR) 40 MG tablet Take 1 tablet (40 mg total) by mouth daily. 30 tablet 0    metoprolol tartrate (LOPRESSOR) 25 MG tablet Take 1 tablet (25 mg total) by mouth 2 (two) times daily. (Patient not taking: Reported on 05/09/2021)   Not Taking   OZEMPIC, 0.25 OR 0.5 MG/DOSE, 2 MG/1.5ML SOPN Inject 0.25 mg into the skin once a week. (Patient not taking: Reported on 05/09/2021)   Not Taking    Social History   Socioeconomic History   Marital status: Married    Spouse name: Not on file   Number of children: Not on file   Years of education: Not on file   Highest education level: Not on file  Occupational History   Not on file  Tobacco Use   Smoking status: Former    Packs/day: 0.50    Years: 45.00    Pack years: 22.50    Types: Cigarettes    Quit date: 04/05/2015    Years since quitting: 6.1   Smokeless tobacco: Never  Vaping Use   Vaping Use: Every day  Substance and  Sexual Activity   Alcohol use: Not Currently    Alcohol/week: 3.0 standard drinks    Types: 3 Glasses of wine per week   Drug use: No   Sexual activity: Not on file  Other Topics Concern   Not on file  Social History Narrative   Not on file   Social Determinants of Health   Financial Resource Strain: Not on file  Food Insecurity: Not on file  Transportation Needs: Not on file  Physical Activity: Not on file  Stress: Not on file  Social Connections: Not on file  Intimate Partner Violence: Not on file    History reviewed. No pertinent family history.    Review of systems complete and found to be negative unless listed above   PHYSICAL EXAM General: Pleasant Caucasian male, well nourished, in no acute distress. Examined laying flat in PCU bed.  HEENT:  Normocephalic and atraumatic. Neck:  No JVD.  Lungs: Normal respiratory effort on room air. no crackles. No wheezes, rhonchi.  Heart: Tachycardic regular rhythm. Normal S1 and S2 without gallops or murmurs. Radial pulse 2+ bilaterally. Abdomen: Obese appearing.  Msk: Normal strength and tone for age. Extremities: bilateral lower extremities wrapped in ACE bandages.  Neuro: Alert and oriented x3 Psych: Mood appropriate for situation.  Labs:   Lab Results  Component Value Date   WBC 6.3 05/15/2021   HGB 10.5 (L) 05/15/2021   HCT 33.7 (L) 05/15/2021   MCV 90.6 05/15/2021   PLT 262 05/15/2021    Recent Labs  Lab 05/08/21 1154 05/09/21 0720 05/15/21 0520  NA 139   < > 138  K 5.1   < > 4.0  CL 97*   < > 99  CO2 32   < > 32  BUN 20   < > 24*  CREATININE 1.02   < > 0.98  CALCIUM 8.2*   < > 8.3*  PROT 7.3  --   --   BILITOT 0.8  --   --   ALKPHOS 197*  --   --  ALT 31  --   --   AST 27  --   --   GLUCOSE 209*   < > 79   < > = values in this interval not displayed.    No results found for: CKTOTAL, CKMB, CKMBINDEX, TROPONINI No results found for: CHOL No results found for: HDL No results found for: LDLCALC No  results found for: TRIG No results found for: CHOLHDL No results found for: LDLDIRECT    Radiology: DG Chest 2 View  Result Date: 05/08/2021 CLINICAL DATA:  Sepsis. EXAM: CHEST - 2 VIEW COMPARISON:  January 16, 2021. FINDINGS: Status post coronary bypass graft. Stable cardiomegaly with central pulmonary vascular congestion is noted. Mild bilateral pulmonary edema may be present. No consolidative process is noted. Bony thorax is unremarkable. IMPRESSION: Stable cardiomegaly with mild central pulmonary vascular congestion and probable mild bilateral pulmonary edema. Electronically Signed   By: Marijo Conception M.D.   On: 05/08/2021 12:31   US Venous Img Lower Bilateral  Result Date: 05/08/2021 CLINICAL DATA:  Pain and swelling EXAM: Bilateral LOWER EXTREMITY VENOUS DOPPLER ULTRASOUND TECHNIQUE: Gray-scale sonography with compression, as well as color and duplex ultrasound, were performed to evaluate the deep venous system(s) from the level of the common femoral vein through the popliteal and proximal calf veins. COMPARISON:  None. FINDINGS: VENOUS Normal compressibility of the common femoral, superficial femoral, and popliteal veins, as well as the visualized calf veins. Visualized portions of profunda femoral vein and great saphenous vein unremarkable. No filling defects to suggest DVT on grayscale or color Doppler imaging. Doppler waveforms show normal direction of venous flow, normal respiratory plasticity and response to augmentation. OTHER None. Limitations: Edema and habitus. IMPRESSION: Negative. Electronically Signed   By: Donavan Foil M.D.   On: 05/08/2021 21:19   DG Foot Complete Left  Result Date: 05/08/2021 CLINICAL DATA:  Bilateral foot wound EXAM: LEFT FOOT - COMPLETE 3+ VIEW COMPARISON:  01/10/2021 FINDINGS: No acute fracture is seen. Previous trans meta tarsal amputation third digit and partial amputation of fifth metatarsal. Cut margins appear smooth. No bony destructive change. No soft  tissue emphysema. IMPRESSION: No acute osseous abnormality. Previous transmetatarsal amputation third digit and partial amputation of fifth metatarsal. Electronically Signed   By: Donavan Foil M.D.   On: 05/08/2021 23:06   DG Foot Complete Right  Result Date: 05/08/2021 CLINICAL DATA:  Foot wound EXAM: RIGHT FOOT COMPLETE - 3+ VIEW COMPARISON:  None. FINDINGS: No fracture is seen. Prior transmetatarsal amputation second digit and previous amputation of third digit at the level of the MTP joint. Smooth cut margins. Irregular arthropathy with erosive changes at the first IP joint of uncertain chronicity. There is soft tissue swelling. Negative for soft tissue emphysema. IMPRESSION: 1. Previous partial amputation of the second and third digits without acute findings here 2. Irregular arthropathy at the first IP joint with erosive changes of uncertain chronicity, correlate for signs of active infection here. Evaluation with MRI may be obtained as indicated. Electronically Signed   By: Donavan Foil M.D.   On: 05/08/2021 23:09   ECHOCARDIOGRAM COMPLETE  Result Date: 05/10/2021    ECHOCARDIOGRAM REPORT   Patient Name:   [name]. Date of Exam: 05/10/2021 Medical Rec #:  [id number]             Height:       76.0 in Accession #:    [id number]            Weight:  350.0 lb Date of Birth:  Oct 30, 1955            BSA:          2.810 m Patient Age:    38 years              BP:           114/65 mmHg Patient Gender: M                     HR:           118 bpm. Exam Location:  [facility] Procedure: 2D Echo, Cardiac Doppler and Color Doppler Indications:     CHF I50.9  History:         Patient has prior history of Echocardiogram examinations, most                  recent 01/04/2021. Previous Myocardial Infarction; Risk                  Factors:Hypertension and Diabetes.  Sonographer:     [name] Sport Referring Phys:  [id number] [place] TANG Diagnosing Phys: Donnelly Angelica  Sonographer Comments: Technically challenging  study due to limited acoustic windows, suboptimal parasternal window, no apical window and no subcostal window. IMPRESSIONS  1. Left ventricular ejection fraction, by estimation, is 50 to 55%. The left ventricle has low normal function. The left ventricle has no regional wall motion abnormalities. There is moderate left ventricular hypertrophy. Left ventricular diastolic function could not be evaluated.  2. Right ventricular systolic function was not well visualized. The right ventricular size is not well visualized.  3. The mitral valve is normal in structure. No evidence of mitral valve regurgitation.  4. The aortic valve is normal in structure. Aortic valve regurgitation is not visualized. Aortic valve sclerosis is present, with no evidence of aortic valve stenosis. FINDINGS  Left Ventricle: Left ventricular ejection fraction, by estimation, is 50 to 55%. The left ventricle has low normal function. The left ventricle has no regional wall motion abnormalities. The left ventricular internal cavity size was normal in size. There is moderate left ventricular hypertrophy. Left ventricular diastolic function could not be evaluated. Right Ventricle: The right ventricular size is not well visualized. Right vetricular wall thickness was not well visualized. Right ventricular systolic function was not well visualized. Left Atrium: Left atrial size was not well visualized. Right Atrium: Right atrial size was not well visualized. Pericardium: There is no evidence of pericardial effusion. Mitral Valve: The mitral valve is normal in structure. Mild mitral annular calcification. No evidence of mitral valve regurgitation. Tricuspid Valve: The tricuspid valve is normal in structure. Tricuspid valve regurgitation is not demonstrated. Aortic Valve: The aortic valve is normal in structure. Aortic valve regurgitation is not visualized. Aortic valve sclerosis is present, with no evidence of aortic valve stenosis. Pulmonic Valve: The  pulmonic valve was not well visualized. Pulmonic valve regurgitation is not visualized. No evidence of pulmonic stenosis. Aorta: The aortic root is normal in size and structure. Venous: The inferior vena cava was not well visualized. IAS/Shunts: The interatrial septum was not well visualized.  LEFT VENTRICLE PLAX 2D LVIDd:         5.80 cm LVIDs:         3.90 cm LV PW:         1.20 cm LV IVS:        1.60 cm LVOT diam:     2.00 cm LVOT Area:  3.14 cm  LEFT ATRIUM         Index LA diam:    5.10 cm 1.81 cm/m                        PULMONIC VALVE AORTA                 PV Vmax:        0.48 m/s Ao Root diam: 3.20 cm PV Vmean:       28.700 cm/s                       PV VTI:         0.057 m                       PV Peak grad:   0.9 mmHg                       PV Mean grad:   0.0 mmHg                       RVOT Peak grad: 1 mmHg   SHUNTS Systemic Diam: 2.00 cm Pulmonic VTI:  0.077 m Donnelly Angelica Electronically signed by Donnelly Angelica Signature Date/Time: 05/10/2021/9:11:30 AM    Final     ECHO LVEF 60 to 65% 12/2020  TELEMETRY reviewed by me: atrial flutter 119  EKG reviewed by me: atrial flutter with 2:1 conduction 123  ASSESSMENT AND PLAN:  65yoM with a PMH significant for CAD s/p CABG 2006 and PCI of RCA 01/2020, HFpEF (LVEF 60 to 65% 12/2020), HTN, BPH, diabetes type 2, class III obesity, PAD s/p left third toe amputation, chronic venous stasis on Unna boots,  A. fib on apixaban, amiodarone and digoxin, sleep apnea, chronic ulcer left foot, OSA on CPAP who presents to the emergency room for evaluation of worsening bilateral lower extremity edema associated with weeping of the extremities.  Cardiology was consulted for assistance with diuresis and his heart failure.  #Acute on chronic HFpEF (LVEF 50-55% 05/10/2020) -clinically improving, off O2 since 1/12.  -Diuresed very well on lasix 60 mg IV BID through 05/13/20. - Continue lasix 60 mg PO daily -strongly reiterated fluid restrictions to 64 ounces per day,  monitoring salt intake.  -Monitor I's and O's while inpatient. - Safe for discharge from a cardiology perspective, but he would like to stay 1 more day to assure he continues in the right direction. Please have him follow up with me in clinic in 1-2 weeks.   #PAD s/p left third toe amputation and right toe amputations #Chronic lower extremity wounds associated with venous stasis -Appreciate vascular surgery, podiatry, and wound care recommendations -Augmentin per primary/ vascular/podiatry -completed ~24hrs of flagyl 1/11  #Paroxysmal Atrial fibrillation on eliquis CHA2DS2-VASc 5. -remains in atrial flutter with rate around ~110. May consider DCCV if persistent in the future.  -continue PO amiodarone 200mg  BID today.  -Continue metoprolol tartrate at 50mg  BID   -Continue digoxin 0.0625 mg once daily, Cardizem CD 300 mg -Continue eliquis 5mg  BID.   #CAD s/p CABG 2006 and PCI of RCA 01/2020 #HTN #Hyperlipidemia -Continue Imdur -Continue Zetia -Would recommend high intensity statin therapy however patient has reported allergy of myalgias to statins -Plan as above  #type 2 diabetes Poorly controlled. A1c on admission 9.2, mgmt per primary team   Signed: Andrez Grime , MD 05/15/2021,  7:51 AM

## 2021-05-15 NOTE — Progress Notes (Signed)
Occupational Therapy Treatment Patient Details Name: [name]. MRN: [id number] DOB: 1955/09/23 Today's Date: 05/15/2021   History of present illness [name]. is a 66 y.o. male with PMH of CAD s/p CABG and subsequent PCI in October 2021, HFpEF, hypertension, type 2 diabetes, morbid obesity, PAD, atrial fibrillation who presents with worsening lower extremity edema and weeping of his chronic lower extremity wounds.   OT comments  Pt received standing at the sink using urinal, no post-op shoe on, no grippy socks on, B feet wrapped. Pt reports he knows he should be wearing it but doesn't see the point when he was just standing to use the urinal, not really "walking much." Pt shares that he tries to minimize WBing to L heel. Pt educated in L foot precautions and reasons for the shoe and why limiting WBing to heel was importance. Pt instructed in falls prevention, facilitated problem solving for ADL/IADL routines at home. Pt verbalized understanding. Continues to demo poor adherence to precautions. Continue to recommend [place] services.    Recommendations for follow up therapy are one component of a multi-disciplinary discharge planning process, led by the attending physician.  Recommendations may be updated based on patient status, additional functional criteria and insurance authorization.    Follow Up Recommendations  Home health OT    Assistance Recommended at Discharge Intermittent Supervision/Assistance  Patient can return home with the following  A little help with walking and/or transfers;A lot of help with bathing/dressing/bathroom;Assistance with cooking/housework;Assist for transportation;Help with stairs or ramp for entrance   Equipment Recommendations  BSC/3in1    Recommendations for Other Services      Precautions / Restrictions Precautions Precautions: Fall Required Braces or Orthoses: Other Brace Other Brace: L wedge shoe Restrictions Weight Bearing  Restrictions: Yes LLE Weight Bearing: Non weight bearing Other Position/Activity Restrictions: "NWB to L heel at all times.  L wedge shoe for short distances and should not be putting much weight at all on his left foot" - podiatry note 05/10/21.       Mobility Bed Mobility Overal bed mobility: Modified Independent                  Transfers                   General transfer comment: received standing at the sink using urinal     Balance Overall balance assessment: Needs assistance Sitting-balance support: No upper extremity supported;Feet supported Sitting balance-Leahy Scale: Good     Standing balance support: During functional activity;No upper extremity supported Standing balance-Leahy Scale: Fair                             ADL either performed or assessed with clinical judgement   ADL                                         General ADL Comments: Pt mod indep with urinal in standing (performing prior to OT's arrival) no shoe on. Pt continues to require assist for donning wedge shoe    Extremity/Trunk Assessment              Vision       Perception     Praxis      Cognition Arousal/Alertness: Awake/alert Behavior During Therapy: WFL for tasks assessed/performed Overall Cognitive Status: Within  Functional Limits for tasks assessed                                 General Comments: poor adherence to [facility] pcns          Exercises Other Exercises Other Exercises: Pt educated in L foot precautions and reasons for the shoe and why limiting WBing to heel was importance. Pt instructed in falls prevention, facilitated problem solving for ADL/IADL routines at home.   Shoulder Instructions       General Comments      Pertinent Vitals/ Pain       Pain Assessment: No/denies pain  Home Living                                          Prior Functioning/Environment               Frequency  Min 2X/week        Progress Toward Goals  OT Goals(current goals can now be found in the care plan section)  Progress towards OT goals: Progressing toward goals  Acute Rehab OT Goals Patient Stated Goal: to go home OT Goal Formulation: With patient/family Time For Goal Achievement: 05/25/21 Potential to Achieve Goals: Good  Plan Discharge plan remains appropriate;Frequency remains appropriate    Co-evaluation                 AM-PAC OT "6 Clicks" Daily Activity     Outcome Measure   Help from another person eating meals?: None Help from another person taking care of personal grooming?: None Help from another person toileting, which includes using toliet, bedpan, or urinal?: A Little Help from another person bathing (including washing, rinsing, drying)?: A Lot Help from another person to put on and taking off regular upper body clothing?: A Little Help from another person to put on and taking off regular lower body clothing?: A Lot 6 Click Score: 18    End of Session    OT Visit Diagnosis: Unsteadiness on feet (R26.81);Other abnormalities of gait and mobility (R26.89)   Activity Tolerance Patient tolerated treatment well   Patient Left in bed;with call bell/phone within reach   Nurse Communication          Time: 3664-4034 OT Time Calculation (min): 24 min  Charges: OT General Charges $OT Visit: 1 Visit OT Treatments $Self Care/Home Management : 23-37 mins  Ardeth Perfect., MPH, MS, OTR/L ascom [phone] 05/15/21, 4:11 PM

## 2021-05-15 NOTE — Progress Notes (Signed)
PROGRESS NOTE    [name].  VWU:[id number] DOB: 1955/12/28 DOA: 05/08/2021 PCP: [name], MD     Brief Narrative:  Alec Less. is a 66 y.o. male with medical history significant for CAD s/p CABG 2006 and PCI of RCA 12/2020 and PCI, HTN, BPH, DM, class III obesity, PAD, A. fib on apixaban, amiodarone and digoxin, sleep apnea, chronic ulcer left foot, PAD s/p left first ray amputation, chronic venous stasis on Unna boots, chronic ulcer left foot, diastolic CHF last EF 60 to 65% 12/2020, OSA on CPAP who presents to the emergency room for evaluation of worsening bilateral lower extremity edema associated with weeping of the extremities as well as dyspnea on exertion.  Patient was hospitalized in September 2022 for cellulitis of chronic left foot ulcer and found to have streptococcal bacteremia at that time.  LLE angiogram showed no significant stenosis.  He is followed both by vascular surgery and podiatry.  New events last 24 hours / Subjective: He states that he cannot really tell that his edema is much better.  Urine output recorded 3755 mL yesterday.  No chest pain or shortness of breath.    Assessment & Plan:   Principal Problem:   Anasarca Active Problems:   Essential hypertension, benign   PAD (peripheral artery disease) (HCC)   Bilateral cellulitis of lower leg   CAD (coronary artery disease), autologous vein bypass graft   Hypersomnia with sleep apnea   Diabetes mellitus without complication (HCC)   Obesity, Class III, BMI 40-49.9 (morbid obesity) (HCC)   BPH (benign prostatic hyperplasia)   Chronic anticoagulation   Acute on chronic diastolic CHF (congestive heart failure) (HCC)   Foot ulcer with fat layer exposed, left (HCC)   Venous stasis of both lower extremities   AF (paroxysmal atrial fibrillation) (HCC)   CHF exacerbation (HCC)   Bilateral lower extremity cellulitis, venous stasis ulcers, PAD -Appreciate vascular surgery and  podiatry -Vascular surgery holding off on angiogram at this time -Status post bedside debridement by Dr. Luana Wood, podiatry, 1/11.  He will need to follow-up with wound care Wood.  Nonweightbearing to left heel, surgical shoe was ordered -Vancomycin, cefepime, Flagyl --> Augmentin for 7 days  -Aspirin   Staph hominis bacteremia -This is likely to be a contaminant as only seen 1 of 4 blood culture bottles -On antibiotics as above regardless  Acute on chronic diastolic HF  -Repeat echocardiogram showed EF 50 to 55% -Cardiology following -Remains volume overloaded but improving, good UOP -Now on p.o. Lasix  A. fib with RVR -Continue amiodarone, diltiazem, digoxin, metoprolol, Eliquis  Diabetes mellitus type 2, uncontrolled with hyperglycemia -A1c 9.2 -Continue Semglee, NovoLog, sliding scale insulin. Dose of novolog increased today due to hyperglycemia   CAD status post CABG -Continue aspirin, atorvastatin, Imdur  RLS -Continue pramipexole, primidone  BPH -Continue Flomax  OSA -CPAP nightly  DVT prophylaxis:  apixaban (ELIQUIS) tablet 5 mg  Code Status: Full code  Family Communication: None at bedside  Disposition Plan:  Status is: Inpatient  Remains inpatient appropriate because: Remains volume overloaded, remains on p.o. Lasix.  Likely discharge home with home health 1/17    Consultants:  Cardiology Podiatry Vascular surgery   Procedures:  None   Antimicrobials:  Anti-infectives (From admission, onward)    Start     Dose/Rate Route Frequency Ordered Stop   05/11/21 1330  amoxicillin-clavulanate (AUGMENTIN) 875-125 MG per tablet 1 tablet        1 tablet Oral 2 times  daily 05/11/21 1241 05/18/21 0959   05/10/21 2200  metroNIDAZOLE (FLAGYL) tablet 500 mg  Status:  Discontinued        500 mg Oral Every 12 hours 05/10/21 1238 05/11/21 1241   05/09/21 1400  vancomycin (VANCOREADY) IVPB 1500 mg/300 mL  Status:  Discontinued        1,500 mg 150 mL/hr over 120  Minutes Intravenous Every 12 hours 05/09/21 0106 05/11/21 1241   05/09/21 1300  vancomycin (VANCOREADY) IVPB 1500 mg/300 mL  Status:  Discontinued        1,500 mg 150 mL/hr over 120 Minutes Intravenous Every 12 hours 05/09/21 0104 05/09/21 0106   05/09/21 0830  metroNIDAZOLE (FLAGYL) IVPB 500 mg  Status:  Discontinued        500 mg 100 mL/hr over 60 Minutes Intravenous Every 12 hours 05/09/21 0828 05/10/21 1238   05/09/21 0600  ceFEPIme (MAXIPIME) 2 g in sodium chloride 0.9 % 100 mL IVPB  Status:  Discontinued        2 g 200 mL/hr over 30 Minutes Intravenous Every 8 hours 05/09/21 0050 05/09/21 0105   05/09/21 0600  ceFEPIme (MAXIPIME) 2 g in sodium chloride 0.9 % 100 mL IVPB  Status:  Discontinued        2 g 200 mL/hr over 30 Minutes Intravenous Every 8 hours 05/09/21 0105 05/11/21 1241   05/09/21 0000  vancomycin (VANCOCIN) IVPB 1000 mg/200 mL premix       See Hyperspace for full Linked Orders Report.   1,000 mg 200 mL/hr over 60 Minutes Intravenous  Once 05/08/21 2303 05/09/21 0625   05/09/21 0000  vancomycin (VANCOREADY) IVPB 1500 mg/300 mL       See Hyperspace for full Linked Orders Report.   1,500 mg 150 mL/hr over 120 Minutes Intravenous  Once 05/08/21 2303 05/09/21 0257   05/08/21 2315  ceFEPIme (MAXIPIME) 2 g in sodium chloride 0.9 % 100 mL IVPB        2 g 200 mL/hr over 30 Minutes Intravenous  Once 05/08/21 2303 05/09/21 0030        Objective: Vitals:   05/14/21 1613 05/14/21 1949 05/15/21 0017 05/15/21 0552  BP: 123/75 136/84 111/75 119/80  Pulse: (!) 109 (!) 108 (!) 105 (!) 106  Resp: 20 18 18 18   Temp: 98.3 F (36.8 C) 98.2 F (36.8 C) 97.8 F (36.6 C) 97.6 F (36.4 C)  TempSrc:    Oral  SpO2: 92% 95% 90% 94%  Weight:      Height:        Intake/Output Summary (Last 24 hours) at 05/15/2021 1008 Last data filed at 05/15/2021 0555 Gross per 24 hour  Intake 480 ml  Output 3456 ml  Net -2976 ml    Filed Weights   05/12/21 0428 05/13/21 0500 05/14/21 0500   Weight: (!) 185.5 kg (!) 182.8 kg (!) 180.8 kg    Examination: General exam: Appears calm and comfortable  Respiratory system: Clear to auscultation. Respiratory effort normal.  On room air Cardiovascular system: S1 & S2 heard, tachycardic, regular rhythm.  +Bilateral pitting pedal edema Gastrointestinal system: Abdomen is nondistended, soft and nontender. Normal bowel sounds heard. Central nervous system: Alert and oriented. Non focal exam. Speech clear  Extremities: Bilateral lower extremities in Unna boots Psychiatry: Judgement and insight appear stable. Mood & affect appropriate.    Data Reviewed: I have personally reviewed following labs and imaging studies  CBC: Recent Labs  Wood 05/08/21 1154 05/10/21 0656 05/12/21 7062 05/15/21 0520  WBC 8.8 7.0 6.3 6.3  NEUTROABS 6.6  --   --   --   HGB 11.7* 11.3* 10.3* 10.5*  HCT 39.2 37.0* 33.5* 33.7*  MCV 95.6 93.7 91.3 90.6  PLT 289 281 274 546    Basic Metabolic Panel: Recent Labs  Wood 05/11/21 0644 05/12/21 0625 05/13/21 0440 05/14/21 0547 05/15/21 0520  NA 137 136 138 137 138  K 4.0 3.9 3.9 4.2 4.0  CL 100 100 99 97* 99  CO2 33* 31 32 33* 32  GLUCOSE 301* 114* 114* 217* 79  BUN 20 22 24* 22 24*  CREATININE 1.01 1.05 1.07 1.12 0.98  CALCIUM 8.0* 8.0* 8.2* 8.2* 8.3*  MG 2.0  --   --   --   --     GFR: Estimated Creatinine Clearance: 130.5 mL/min (by C-G formula based on SCr of 0.98 mg/dL). Liver Function Tests: Recent Labs  Wood 05/08/21 1154  AST 27  ALT 31  ALKPHOS 197*  BILITOT 0.8  PROT 7.3  ALBUMIN 2.9*    No results for input(s): LIPASE, AMYLASE in the last 168 hours. No results for input(s): AMMONIA in the last 168 hours. Coagulation Profile: Recent Labs  Wood 05/08/21 1154  INR 1.2    Cardiac Enzymes: No results for input(s): CKTOTAL, CKMB, CKMBINDEX, TROPONINI in the last 168 hours. BNP (last 3 results) No results for input(s): PROBNP in the last 8760 hours. HbA1C: No results for  input(s): HGBA1C in the last 72 hours.  CBG: Recent Labs  Wood 05/13/21 1749 05/13/21 2019 05/14/21 0750 05/14/21 1301 05/14/21 1614  GLUCAP 201* 306* 203* 222* 233*    Lipid Profile: No results for input(s): CHOL, HDL, LDLCALC, TRIG, CHOLHDL, LDLDIRECT in the last 72 hours. Thyroid Function Tests: No results for input(s): TSH, T4TOTAL, FREET4, T3FREE, THYROIDAB in the last 72 hours. Anemia Panel: No results for input(s): VITAMINB12, FOLATE, FERRITIN, TIBC, IRON, RETICCTPCT in the last 72 hours. Sepsis Labs: Recent Labs  Wood 05/08/21 1154 05/08/21 1258  LATICACIDVEN 2.3* 1.5     Recent Results (from the past 240 hour(s))  Culture, blood (Routine x 2)     Status: None   Collection Time: 05/08/21 11:54 AM   Specimen: BLOOD  Result Value Ref Range Status   Specimen Description BLOOD LEFT ANTECUBITAL  Final   Special Requests   Final    BOTTLES DRAWN AEROBIC AND ANAEROBIC Blood Culture adequate volume   Culture   Final    NO GROWTH 5 DAYS Performed at [name], [address] [place], [place] [address]    Report Status 05/13/2021 FINAL  Final  Culture, blood (Routine x 2)     Status: Abnormal   Collection Time: 05/08/21 12:56 PM   Specimen: BLOOD  Result Value Ref Range Status   Specimen Description   Final    BLOOD BLOOD LEFT FOREARM Performed at [name], [address] [place], [place] [address]    Special Requests   Final    BOTTLES DRAWN AEROBIC AND ANAEROBIC Blood Culture adequate volume Performed at [facility], [address] [place], [place] [address]    Culture  Setup Time   Final    GRAM POSITIVE COCCI Organism ID to follow AEROBIC BOTTLE ONLY CRITICAL RESULT CALLED TO, READ BACK BY AND VERIFIED WITH: LISA KOUTZZ 05/09/21 0706 Performed at [name], [address] [place], [place] [address]    Culture (A)  Final    STAPHYLOCOCCUS HOMINIS THE SIGNIFICANCE OF  ISOLATING THIS ORGANISM FROM A SINGLE  SET OF BLOOD CULTURES WHEN MULTIPLE SETS ARE DRAWN IS UNCERTAIN. PLEASE NOTIFY THE MICROBIOLOGY DEPARTMENT WITHIN ONE WEEK IF SPECIATION AND SENSITIVITIES ARE REQUIRED. Performed at [name], [place] [address] [place], [place] [address]    Report Status 05/11/2021 FINAL  Final  Blood Culture ID Panel (Reflexed)     Status: Abnormal   Collection Time: 05/08/21 12:56 PM  Result Value Ref Range Status   Enterococcus faecalis NOT DETECTED NOT DETECTED Final   Enterococcus Faecium NOT DETECTED NOT DETECTED Final   Listeria monocytogenes NOT DETECTED NOT DETECTED Final   Staphylococcus species DETECTED (A) NOT DETECTED Final    Comment: CRITICAL RESULT CALLED TO, READ BACK BY AND VERIFIED WITH: LISA KOTZZ 05/09/21 0707 MW    Staphylococcus aureus (BCID) NOT DETECTED NOT DETECTED Final   Staphylococcus epidermidis NOT DETECTED NOT DETECTED Final   Staphylococcus lugdunensis NOT DETECTED NOT DETECTED Final   Streptococcus species NOT DETECTED NOT DETECTED Final   Streptococcus agalactiae NOT DETECTED NOT DETECTED Final   Streptococcus pneumoniae NOT DETECTED NOT DETECTED Final   Streptococcus pyogenes NOT DETECTED NOT DETECTED Final   A.calcoaceticus-baumannii NOT DETECTED NOT DETECTED Final   Bacteroides fragilis NOT DETECTED NOT DETECTED Final   Enterobacterales NOT DETECTED NOT DETECTED Final   Enterobacter cloacae complex NOT DETECTED NOT DETECTED Final   Escherichia coli NOT DETECTED NOT DETECTED Final   Klebsiella aerogenes NOT DETECTED NOT DETECTED Final   Klebsiella oxytoca NOT DETECTED NOT DETECTED Final   Klebsiella pneumoniae NOT DETECTED NOT DETECTED Final   Proteus species NOT DETECTED NOT DETECTED Final   Salmonella species NOT DETECTED NOT DETECTED Final   Serratia marcescens NOT DETECTED NOT DETECTED Final   Haemophilus influenzae NOT DETECTED NOT DETECTED Final   Neisseria meningitidis NOT DETECTED NOT DETECTED Final   Pseudomonas aeruginosa NOT DETECTED NOT DETECTED  Final   Stenotrophomonas maltophilia NOT DETECTED NOT DETECTED Final   Candida albicans NOT DETECTED NOT DETECTED Final   Candida auris NOT DETECTED NOT DETECTED Final   Candida glabrata NOT DETECTED NOT DETECTED Final   Candida krusei NOT DETECTED NOT DETECTED Final   Candida parapsilosis NOT DETECTED NOT DETECTED Final   Candida tropicalis NOT DETECTED NOT DETECTED Final   Cryptococcus neoformans/gattii NOT DETECTED NOT DETECTED Final    Comment: Performed at [name], [address] [place], [place] [address]  Resp Panel by RT-PCR (Flu A&B, Covid) Nasopharyngeal Swab     Status: None   Collection Time: 05/09/21  9:50 AM   Specimen: Nasopharyngeal Swab; Nasopharyngeal(NP) swabs in vial transport medium  Result Value Ref Range Status   SARS Coronavirus 2 by RT PCR NEGATIVE NEGATIVE Final    Comment: (NOTE) SARS-CoV-2 target nucleic acids are NOT DETECTED.  The SARS-CoV-2 RNA is generally detectable in upper respiratory specimens during the acute phase of infection. The lowest concentration of SARS-CoV-2 viral copies this assay can detect is 138 copies/mL. A negative result does not preclude SARS-Cov-2 infection and should not be used as the sole basis for treatment or other patient management decisions. A negative result may occur with  improper specimen collection/handling, submission of specimen other than nasopharyngeal swab, presence of viral mutation(s) within the areas targeted by this assay, and inadequate number of viral copies(<138 copies/mL). A negative result must be combined with clinical observations, patient history, and epidemiological information. The expected result is Negative.  Fact Sheet for Patients:  EntrepreneurPulse.com.au  Fact Sheet for Healthcare Providers:  IncredibleEmployment.be  This test is no t yet approved or cleared by the [place] and  has been authorized for detection and/or  diagnosis of SARS-CoV-2 by FDA under an Emergency Use Authorization (EUA). This EUA will remain  in effect (meaning this test can be used) for the duration of the COVID-19 declaration under Section 564(b)(1) of the Act, 21 U.S.C.section 360bbb-3(b)(1), unless the authorization is terminated  or revoked sooner.       Influenza A by PCR NEGATIVE NEGATIVE Final   Influenza B by PCR NEGATIVE NEGATIVE Final    Comment: (NOTE) The Xpert Xpress SARS-CoV-2/FLU/RSV plus assay is intended as an aid in the diagnosis of influenza from Nasopharyngeal swab specimens and should not be used as a sole basis for treatment. Nasal washings and aspirates are unacceptable for Xpert Xpress SARS-CoV-2/FLU/RSV testing.  Fact Sheet for Patients: EntrepreneurPulse.com.au  Fact Sheet for Healthcare Providers: IncredibleEmployment.be  This test is not yet approved or cleared by the [place] FDA and has been authorized for detection and/or diagnosis of SARS-CoV-2 by FDA under an Emergency Use Authorization (EUA). This EUA will remain in effect (meaning this test can be used) for the duration of the COVID-19 declaration under Section 564(b)(1) of the Act, 21 U.S.C. section 360bbb-3(b)(1), unless the authorization is terminated or revoked.  Performed at [name], [address], [place], [place] [address]        Radiology Studies: No results found.    Scheduled Meds:  amiodarone  200 mg Oral BID   amoxicillin-clavulanate  1 tablet Oral BID   apixaban  5 mg Oral BID   aspirin EC  81 mg Oral Daily   atorvastatin  40 mg Oral Daily   collagenase   Topical Daily   digoxin  0.0625 mg Oral Daily   diltiazem  300 mg Oral Daily   ezetimibe  10 mg Oral Daily   furosemide  60 mg Oral Daily   gabapentin  300 mg Oral Daily   triamcinolone cream   Topical Once per day on Mon Thu   And   hydrocerin  1 application Topical Once per day on Mon Thu    insulin aspart  0-20 Units Subcutaneous TID WC   insulin aspart  0-5 Units Subcutaneous QHS   insulin aspart  10 Units Subcutaneous TID WC   insulin glargine-yfgn  55 Units Subcutaneous BID   isosorbide mononitrate  60 mg Oral BID   metoprolol tartrate  50 mg Oral BID   pramipexole  2 mg Oral QHS   primidone  250 mg Oral BID   sodium chloride flush  3 mL Intravenous Q12H   tamsulosin  0.4 mg Oral Daily   Continuous Infusions:  sodium chloride Stopped (05/11/21 1542)     LOS: 7 days     Dessa Phi, DO [facility] 05/15/2021, 10:08 AM   Available via Epic secure chat 7am-7pm After these hours, please refer to coverage provider listed on [email or web address]

## 2021-05-15 NOTE — Consult Note (Signed)
[facility] Nurse wound follow up Wound type:Lymphedema, pressure injury to left heel. No open wounds on RLE or LLE other than heel wound today Measurement:2cm x 2.5cm ocal x 0.3cm Wound QJF:[id number] fibrinous slough present. Improving. Drainage (amount, consistency, odor) small serous to light yellow Periwound: dry skin consistent with lymphedema changes Dressing procedure/placement/frequency: Boots removed, legs washed and dried by NT.  Santyl applied to left heel wound, topped with saline dampened gauze and dry gauze. Bilateral Unna's boots applied (left foot with heel exposed) and topped with Kerlix roll gauze (left foot with heel exposed). This is covered by Coban self adherent wrap dressing.and left heel is covered.   Appreciate assistance of NT who was instrumental in the process.  [facility] nursing team will follow while in house, and will remain available to this patient, the nursing and medical teams.  Next Unna's Boot change is Thursday, if still in house.  Nursing to apply collagenase (Santyl) per orders daily.   Thanks, Maudie Flakes, MSN, RN, [place], Arther Abbott  Pager# [phone]

## 2021-05-16 DIAGNOSIS — E7800 Pure hypercholesterolemia, unspecified: Secondary | ICD-10-CM | POA: Diagnosis not present

## 2021-05-16 DIAGNOSIS — I2510 Atherosclerotic heart disease of native coronary artery without angina pectoris: Secondary | ICD-10-CM | POA: Diagnosis not present

## 2021-05-16 DIAGNOSIS — I10 Essential (primary) hypertension: Secondary | ICD-10-CM | POA: Diagnosis not present

## 2021-05-16 DIAGNOSIS — I25710 Atherosclerosis of autologous vein coronary artery bypass graft(s) with unstable angina pectoris: Secondary | ICD-10-CM | POA: Diagnosis not present

## 2021-05-16 LAB — BASIC METABOLIC PANEL
Anion gap: 4 — ABNORMAL LOW (ref 5–15)
BUN: 20 mg/dL (ref 8–23)
CO2: 33 mmol/L — ABNORMAL HIGH (ref 22–32)
Calcium: 8.1 mg/dL — ABNORMAL LOW (ref 8.9–10.3)
Chloride: 98 mmol/L (ref 98–111)
Creatinine, Ser: 1.04 mg/dL (ref 0.61–1.24)
GFR, Estimated: >60 mL/min (ref >60)
Glucose, Bld: 166 mg/dL — ABNORMAL HIGH (ref 70–99)
Potassium: 4.4 mmol/L (ref 3.5–5.1)
Sodium: 135 mmol/L (ref 135–145)

## 2021-05-16 LAB — GLUCOSE, CAPILLARY: Glucose-Capillary: 136 mg/dL — ABNORMAL HIGH (ref 70–99)

## 2021-05-16 MED ORDER — FUROSEMIDE 20 MG PO TABS: 60.0000 mg | ORAL_TABLET | Freq: Every day | ORAL | 0 refills | Status: DC

## 2021-05-16 MED ORDER — AMOXICILLIN-POT CLAVULANATE 875-125 MG PO TABS: 1.0000 | ORAL_TABLET | Freq: Two times a day (BID) | ORAL | 0 refills | Status: AC

## 2021-05-16 MED ORDER — ASPIRIN 81 MG PO TBEC: 81.0000 mg | DELAYED_RELEASE_TABLET | Freq: Every day | ORAL | 0 refills | Status: DC

## 2021-05-16 MED ORDER — AMIODARONE HCL 200 MG PO TABS: 200.0000 mg | ORAL_TABLET | Freq: Two times a day (BID) | ORAL | 0 refills | Status: DC

## 2021-05-16 NOTE — Plan of Care (Signed)

## 2021-05-16 NOTE — Care Management Important Message (Signed)
Important Message  Patient Details  Name: [name]. MRN: [id number] Date of Birth: 21-Mar-1956   Medicare Important Message Given:  Yes     [name] 05/16/2021, 11:36 AM

## 2021-05-16 NOTE — Discharge Summary (Signed)
Physician Discharge Summary  [name]. RWE:[id number] DOB: 11/02/1955 DOA: 05/08/2021  PCP: [name], MD  Admit date: 05/08/2021 Discharge date: 05/16/2021  Admitted From: Home Disposition:  Home with home health   Recommendations for Outpatient Follow-up:  Follow up with PCP in 1 week Follow-up with cardiology, Dr. Corky Sox, in 1-2 weeks Follow-up with podiatry, Dr. Luana Shu, in 1 month Follow-up with vascular surgery, Dr. Lucky Cowboy, in 1 month Follow-up with wound care center as soon as possible  Discharge Condition: Stable CODE STATUS: Full  Diet recommendation: Heart healthy   Brief/Interim Summary: [name]. is a 66 y.o. male with medical history significant for CAD s/p CABG 2006 and PCI of RCA 12/2020 and PCI, HTN, BPH, DM, class III obesity, PAD, A. fib on apixaban, amiodarone and digoxin, sleep apnea, chronic ulcer left foot, PAD s/p left first ray amputation, chronic venous stasis on Unna boots, chronic ulcer left foot, diastolic CHF last EF 60 to 65% 12/2020, OSA on CPAP who presents to the emergency room for evaluation of worsening bilateral lower extremity edema associated with weeping of the extremities as well as dyspnea on exertion.  Patient was hospitalized in September 2022 for cellulitis of chronic left foot ulcer and found to have streptococcal bacteremia at that time.  LLE angiogram showed no significant stenosis.  He is followed both by vascular surgery and podiatry.  Patient underwent bedside debridement by podiatry on 1/11.  He was treated with broad-spectrum antibiotics which were de-escalated to Augmentin.  He was diuresed with IV Lasix which was transitioned to p.o. Lasix for discharge.  Discharge Diagnoses:  Principal Problem:   Anasarca Active Problems:   Essential hypertension, benign   PAD (peripheral artery disease) (HCC)   Bilateral cellulitis of lower leg   CAD (coronary artery disease), autologous vein bypass graft   Hypersomnia with  sleep apnea   Diabetes mellitus without complication (HCC)   Obesity, Class III, BMI 40-49.9 (morbid obesity) (HCC)   BPH (benign prostatic hyperplasia)   Chronic anticoagulation   Acute on chronic diastolic CHF (congestive heart failure) (HCC)   Foot ulcer with fat layer exposed, left (HCC)   Venous stasis of both lower extremities   AF (paroxysmal atrial fibrillation) (HCC)   CHF exacerbation (HCC)   Bilateral lower extremity cellulitis, venous stasis ulcers, PAD -Appreciate vascular surgery and podiatry -Vascular surgery holding off on angiogram at this time -Status post bedside debridement by Dr. Luana Shu, podiatry, 1/11.  He will need to follow-up with wound care center.  Nonweightbearing to left heel, surgical shoe was ordered -Vancomycin, cefepime, Flagyl --> Augmentin for 7 days (last day 1/18) -Aspirin    Staph hominis bacteremia -This is likely to be a contaminant as only seen 1 of 4 blood culture bottles -On antibiotics as above regardless  Acute on chronic diastolic HF  -Repeat echocardiogram showed EF 50 to 55% -Cardiology following, follow-up in the clinic in 1 to 2 weeks -Now on p.o. Lasix, has diuresed well during hospitalization   A. fib with RVR -Continue amiodarone, diltiazem, digoxin, metoprolol, Eliquis   Diabetes mellitus type 2, uncontrolled with hyperglycemia -A1c 9.2 -Continue Semglee, NovoLog, sliding scale insulin.    CAD status post CABG -Continue aspirin, atorvastatin, Imdur   RLS -Continue pramipexole, primidone   BPH -Continue Flomax   OSA -CPAP nightly  Discharge Instructions  Discharge Instructions     (HEART FAILURE PATIENTS) Call MD:  Anytime you have any of the following symptoms: 1) 3 pound weight gain in  24 hours or 5 pounds in 1 week 2) shortness of breath, with or without a dry hacking cough 3) swelling in the hands, feet or stomach 4) if you have to sleep on extra pillows at night in order to breathe.   Complete by: As  directed    Call MD for:  difficulty breathing, headache or visual disturbances   Complete by: As directed    Call MD for:  extreme fatigue   Complete by: As directed    Call MD for:  persistant dizziness or light-headedness   Complete by: As directed    Call MD for:  persistant nausea and vomiting   Complete by: As directed    Call MD for:  severe uncontrolled pain   Complete by: As directed    Call MD for:  temperature >100.4   Complete by: As directed    Diet - low sodium heart healthy   Complete by: As directed    Discharge instructions   Complete by: As directed    You were cared for by a hospitalist during your hospital stay. If you have any questions about your discharge medications or the care you received while you were in the hospital after you are discharged, you can call the unit and ask to speak with the hospitalist on call if the hospitalist that took care of you is not available. Once you are discharged, your primary care physician will handle any further medical issues. Please note that NO REFILLS for any discharge medications will be authorized once you are discharged, as it is imperative that you return to your primary care physician (or establish a relationship with a primary care physician if you do not have one) for your aftercare needs so that they can reassess your need for medications and monitor your lab values.   Discharge wound care:   Complete by: As directed    Cleanse bilateral lower extremities with warm soapy water and dry clean.  Apply silver alginate to venous wounds present to bilateral lower extremities and to the right hallux and left second toe wounds.  Apply Santyl to the left heel wound.  To the left heel wound apply 4 x 4 gauze and ABD pad.  Apply gauze to the left second toe and right hallux wounds as well as a small amount of Kling to hold in place.  Apply Unna boots to bilateral lower extremities from forefoot to below knee.  Would be best to try to  keep the left heel somewhat exposed for applications of Santyl daily if possible, but would still recommend compressing the left foot and heel along with the Unna boot to control swelling further.  Recommend changing Unna boots 2x weekly but if possible recommend applying Santyl to the left heel daily with dressing changes.   Increase activity slowly   Complete by: As directed       Allergies as of 05/16/2021       Reactions   Statins    Other reaction(s): Muscle Pain Causes legs to ache per pt        Medication List     STOP taking these medications    Ozempic (0.25 or 0.5 MG/DOSE) 2 MG/1.5ML Sopn Generic drug: Semaglutide(0.25 or 0.5MG /DOS)       TAKE these medications    amiodarone 200 MG tablet Commonly known as: PACERONE Take 1 tablet (200 mg total) by mouth 2 (two) times daily. What changed: when to take this   amoxicillin-clavulanate 875-125 MG tablet  Commonly known as: AUGMENTIN Take 1 tablet by mouth 2 (two) times daily for 1 day.   apixaban 5 MG Tabs tablet Commonly known as: ELIQUIS Take 1 tablet (5 mg total) by mouth 2 (two) times daily.   aspirin 81 MG EC tablet Take 1 tablet (81 mg total) by mouth daily. Swallow whole. Start taking on: May 17, 2021   atorvastatin 40 MG tablet Commonly known as: LIPITOR Take 1 tablet (40 mg total) by mouth daily.   Digoxin 62.5 MCG Tabs Take 0.0625 mg by mouth daily.   diltiazem 300 MG 24 hr capsule Commonly known as: CARDIZEM CD Take 1 capsule (300 mg total) by mouth daily.   ezetimibe 10 MG tablet Commonly known as: ZETIA Take 10 mg by mouth daily.   ferrous sulfate 325 (65 FE) MG tablet Take 325 mg by mouth daily with breakfast.   furosemide 20 MG tablet Commonly known as: LASIX Take 3 tablets (60 mg total) by mouth daily. Start taking on: May 17, 2021 What changed: how much to take   gabapentin 300 MG capsule Commonly known as: NEURONTIN Take 300 mg by mouth at bedtime.   insulin aspart  100 UNIT/ML injection Commonly known as: novoLOG Inject 9 Units into the skin 3 (three) times daily with meals. What changed:  how much to take additional instructions   isosorbide mononitrate 60 MG 24 hr tablet Commonly known as: IMDUR Take 60 mg by mouth 2 (two) times daily.   Lantus SoloStar 100 UNIT/ML Solostar Pen Generic drug: insulin glargine Inject 50 Units into the skin 2 (two) times daily. What changed:  how much to take when to take this   metFORMIN 1000 MG tablet Commonly known as: GLUCOPHAGE Take 1,000 mg by mouth 2 (two) times daily.   metoprolol tartrate 50 MG tablet Commonly known as: LOPRESSOR Take 50 mg by mouth 2 (two) times daily. What changed: Another medication with the same name was removed. Continue taking this medication, and follow the directions you see here.   multivitamin with minerals tablet Take 1 tablet by mouth daily.   nitroGLYCERIN 0.4 MG SL tablet Commonly known as: NITROSTAT Place 0.4 mg under the tongue daily as needed.   potassium chloride 10 MEQ tablet Commonly known as: KLOR-CON Take 10 mEq by mouth daily.   pramipexole 1 MG tablet Commonly known as: MIRAPEX Take 2 mg by mouth at bedtime.   primidone 250 MG tablet Commonly known as: MYSOLINE Take 250 mg by mouth 2 (two) times daily.   tamsulosin 0.4 MG Caps capsule Commonly known as: FLOMAX Take 0.4 mg by mouth daily.   vitamin B-12 1000 MCG tablet Commonly known as: CYANOCOBALAMIN Take 10,000 mcg by mouth daily. Energy shot   zinc gluconate 50 MG tablet Take 50 mg by mouth daily.               Discharge Care Instructions  (From admission, onward)           Start     Ordered   05/16/21 0000  Discharge wound care:       Comments: Cleanse bilateral lower extremities with warm soapy water and dry clean.  Apply silver alginate to venous wounds present to bilateral lower extremities and to the right hallux and left second toe wounds.  Apply Santyl to the  left heel wound.  To the left heel wound apply 4 x 4 gauze and ABD pad.  Apply gauze to the left second toe and right hallux wounds as  well as a small amount of Kling to hold in place.  Apply Unna boots to bilateral lower extremities from forefoot to below knee.  Would be best to try to keep the left heel somewhat exposed for applications of Santyl daily if possible, but would still recommend compressing the left foot and heel along with the Unna boot to control swelling further.  Recommend changing Unna boots 2x weekly but if possible recommend applying Santyl to the left heel daily with dressing changes.   05/16/21 1032            Follow-up Information     [facility]. Schedule an appointment as soon as possible for a visit in 1 week(s).   Specialty: Wound Care Contact information: [address] [id number] ar [phone]        Algernon Huxley, MD. Schedule an appointment as soon as possible for a visit in 1 month(s).   Specialties: Vascular Surgery, Radiology, Interventional Cardiology Contact information: [place] [address] [phone]         Caroline More, DPM. Schedule an appointment as soon as possible for a visit in 1 month(s).   Specialty: Podiatry Contact information: [place] [address] [phone]         Andrez Grime, MD. Go in 1 week(s).   Specialty: Cardiology Why: please follow up 1-2 weeks after discharge Contact information: [place] [address] [phone]         [name], MD. Schedule an appointment as soon as possible for a visit in 1 week(s).   Specialty: Internal Medicine Contact information: [place] [address] [phone]                Allergies  Allergen Reactions   Statins     Other reaction(s): Muscle Pain Causes legs to ache per pt       Procedures/Studies: DG Chest 2 View  Result Date: 05/08/2021 CLINICAL DATA:  Sepsis. EXAM: CHEST - 2 VIEW COMPARISON:  January 16, 2021. FINDINGS: Status post coronary bypass graft. Stable cardiomegaly with central pulmonary vascular congestion is noted. Mild bilateral pulmonary edema may be present. No consolidative process is noted. Bony thorax is unremarkable. IMPRESSION: Stable cardiomegaly with mild central pulmonary vascular congestion and probable mild bilateral pulmonary edema. Electronically Signed   By: Marijo Conception M.D.   On: 05/08/2021 12:31   US Venous Img Lower Bilateral  Result Date: 05/08/2021 CLINICAL DATA:  Pain and swelling EXAM: Bilateral LOWER EXTREMITY VENOUS DOPPLER ULTRASOUND TECHNIQUE: Gray-scale sonography with compression, as well as color and duplex ultrasound, were performed to evaluate the deep venous system(s) from the level of the common femoral vein through the popliteal and proximal calf veins. COMPARISON:  None. FINDINGS: VENOUS Normal compressibility of the common femoral, superficial femoral, and popliteal veins, as well as the visualized calf veins. Visualized portions of profunda femoral vein and great saphenous vein unremarkable. No filling defects to suggest DVT on grayscale or color Doppler imaging. Doppler waveforms show normal direction of venous flow, normal respiratory plasticity and response to augmentation. OTHER None. Limitations: Edema and habitus. IMPRESSION: Negative. Electronically Signed   By: Donavan Foil M.D.   On: 05/08/2021 21:19   DG Foot Complete Left  Result Date: 05/08/2021 CLINICAL DATA:  Bilateral foot wound EXAM: LEFT FOOT - COMPLETE 3+ VIEW COMPARISON:  01/10/2021 FINDINGS: No acute fracture is seen. Previous trans  meta tarsal amputation third digit and partial amputation of fifth metatarsal. Cut margins appear smooth. No bony destructive change. No soft tissue emphysema. IMPRESSION: No acute osseous abnormality. Previous  transmetatarsal amputation third digit and partial amputation of fifth metatarsal. Electronically Signed   By: Donavan Foil M.D.   On: 05/08/2021 23:06   DG Foot Complete Right  Result Date: 05/08/2021 CLINICAL DATA:  Foot wound EXAM: RIGHT FOOT COMPLETE - 3+ VIEW COMPARISON:  None. FINDINGS: No fracture is seen. Prior transmetatarsal amputation second digit and previous amputation of third digit at the level of the MTP joint. Smooth cut margins. Irregular arthropathy with erosive changes at the first IP joint of uncertain chronicity. There is soft tissue swelling. Negative for soft tissue emphysema. IMPRESSION: 1. Previous partial amputation of the second and third digits without acute findings here 2. Irregular arthropathy at the first IP joint with erosive changes of uncertain chronicity, correlate for signs of active infection here. Evaluation with MRI may be obtained as indicated. Electronically Signed   By: Donavan Foil M.D.   On: 05/08/2021 23:09   ECHOCARDIOGRAM COMPLETE  Result Date: 05/10/2021    ECHOCARDIOGRAM REPORT   Patient Name:   Rondale Nies. Date of Exam: 05/10/2021 Medical Rec #:  [id number]             Height:       76.0 in Accession #:    [id number]            Weight:       350.0 lb Date of Birth:  [birth year]            BSA:          2.810 m Patient Age:    66 years              BP:           114/65 mmHg Patient Gender: M                     HR:           118 bpm. Exam Location:  [facility] Procedure: 2D Echo, Cardiac Doppler and Color Doppler Indications:     CHF I50.9  History:         Patient has prior history of Echocardiogram examinations, most                  recent 01/04/2021. Previous Myocardial Infarction; Risk                  Factors:Hypertension and Diabetes.  Sonographer:     [name] Sport Referring Phys:  [id number] [place] TANG Diagnosing Phys: Donnelly Angelica  Sonographer Comments: Technically challenging study due to limited acoustic windows, suboptimal parasternal window,  no apical window and no subcostal window. IMPRESSIONS  1. Left ventricular ejection fraction, by estimation, is 50 to 55%. The left ventricle has low normal function. The left ventricle has no regional wall motion abnormalities. There is moderate left ventricular hypertrophy. Left ventricular diastolic function could not be evaluated.  2. Right ventricular systolic function was not well visualized. The right ventricular size is not well visualized.  3. The mitral valve is normal in structure. No evidence of mitral valve regurgitation.  4. The aortic valve is normal in structure. Aortic valve regurgitation is not visualized. Aortic valve sclerosis is present, with no evidence of aortic valve stenosis. FINDINGS  Left Ventricle: Left ventricular ejection fraction, by estimation, is 50 to 55%.  The left ventricle has low normal function. The left ventricle has no regional wall motion abnormalities. The left ventricular internal cavity size was normal in size. There is moderate left ventricular hypertrophy. Left ventricular diastolic function could not be evaluated. Right Ventricle: The right ventricular size is not well visualized. Right vetricular wall thickness was not well visualized. Right ventricular systolic function was not well visualized. Left Atrium: Left atrial size was not well visualized. Right Atrium: Right atrial size was not well visualized. Pericardium: There is no evidence of pericardial effusion. Mitral Valve: The mitral valve is normal in structure. Mild mitral annular calcification. No evidence of mitral valve regurgitation. Tricuspid Valve: The tricuspid valve is normal in structure. Tricuspid valve regurgitation is not demonstrated. Aortic Valve: The aortic valve is normal in structure. Aortic valve regurgitation is not visualized. Aortic valve sclerosis is present, with no evidence of aortic valve stenosis. Pulmonic Valve: The pulmonic valve was not well visualized. Pulmonic valve regurgitation is  not visualized. No evidence of pulmonic stenosis. Aorta: The aortic root is normal in size and structure. Venous: The inferior vena cava was not well visualized. IAS/Shunts: The interatrial septum was not well visualized.  LEFT VENTRICLE PLAX 2D LVIDd:         5.80 cm LVIDs:         3.90 cm LV PW:         1.20 cm LV IVS:        1.60 cm LVOT diam:     2.00 cm LVOT Area:     3.14 cm  LEFT ATRIUM         Index LA diam:    5.10 cm 1.81 cm/m                        PULMONIC VALVE AORTA                 PV Vmax:        0.48 m/s Ao Root diam: 3.20 cm PV Vmean:       28.700 cm/s                       PV VTI:         0.057 m                       PV Peak grad:   0.9 mmHg                       PV Mean grad:   0.0 mmHg                       RVOT Peak grad: 1 mmHg   SHUNTS Systemic Diam: 2.00 cm Pulmonic VTI:  0.077 m Donnelly Angelica Electronically signed by Donnelly Angelica Signature Date/Time: 05/10/2021/9:11:30 AM    Final        Discharge Exam: Vitals:   05/16/21 0401 05/16/21 0751  BP: 101/66 106/66  Pulse: (!) 106 (!) 105  Resp:  19  Temp: 98.3 F (36.8 C) 98.3 F (36.8 C)  SpO2: 93% 90%    General: Pt is alert, awake, not in acute distress Cardiovascular: tachycardic regular rhythm, S1/S2 +, bilateral edema but improved since admission  Respiratory: CTA bilaterally, no wheezing, no rhonchi, no respiratory distress, no conversational dyspnea, on room air  Abdominal: Soft, NT, ND, bowel sounds + Extremities: in Unna boots bilaterally  Psych: Normal mood and affect, stable judgement and insight     The results of significant diagnostics from this hospitalization (including imaging, microbiology, ancillary and laboratory) are listed below for reference.     Microbiology: Recent Results (from the past 240 hour(s))  Culture, blood (Routine x 2)     Status: None   Collection Time: 05/08/21 11:54 AM   Specimen: BLOOD  Result Value Ref Range Status   Specimen Description BLOOD LEFT ANTECUBITAL  Final    Special Requests   Final    BOTTLES DRAWN AEROBIC AND ANAEROBIC Blood Culture adequate volume   Culture   Final    NO GROWTH 5 DAYS Performed at [facility], [address] [place], [place] [address]    Report Status 05/13/2021 FINAL  Final  Culture, blood (Routine x 2)     Status: Abnormal   Collection Time: 05/08/21 12:56 PM   Specimen: BLOOD  Result Value Ref Range Status   Specimen Description   Final    BLOOD BLOOD LEFT FOREARM Performed at [facility], [address], [place], [place] [address]    Special Requests   Final    BOTTLES DRAWN AEROBIC AND ANAEROBIC Blood Culture adequate volume Performed at [facility], [address] [place], [place] [address]    Culture  Setup Time   Final    GRAM POSITIVE COCCI Organism ID to follow AEROBIC BOTTLE ONLY CRITICAL RESULT CALLED TO, READ BACK BY AND VERIFIED WITH: LISA KOUTZZ 05/09/21 0706 Performed at [facility], [address] [place], [place] [address]    Culture (A)  Final    STAPHYLOCOCCUS HOMINIS THE SIGNIFICANCE OF ISOLATING THIS ORGANISM FROM A SINGLE SET OF BLOOD CULTURES WHEN MULTIPLE SETS ARE DRAWN IS UNCERTAIN. PLEASE NOTIFY THE MICROBIOLOGY DEPARTMENT WITHIN ONE WEEK IF SPECIATION AND SENSITIVITIES ARE REQUIRED. Performed at [facility], [place] [address] [place], [place] [address]    Report Status 05/11/2021 FINAL  Final  Blood Culture ID Panel (Reflexed)     Status: Abnormal   Collection Time: 05/08/21 12:56 PM  Result Value Ref Range Status   Enterococcus faecalis NOT DETECTED NOT DETECTED Final   Enterococcus Faecium NOT DETECTED NOT DETECTED Final   Listeria monocytogenes NOT DETECTED NOT DETECTED Final   Staphylococcus species DETECTED (A) NOT DETECTED Final    Comment: CRITICAL RESULT CALLED TO, READ BACK BY AND VERIFIED WITH: LISA KOTZZ 05/09/21 0707 MW    Staphylococcus aureus (BCID) NOT DETECTED NOT DETECTED Final   Staphylococcus epidermidis NOT  DETECTED NOT DETECTED Final   Staphylococcus lugdunensis NOT DETECTED NOT DETECTED Final   Streptococcus species NOT DETECTED NOT DETECTED Final   Streptococcus agalactiae NOT DETECTED NOT DETECTED Final   Streptococcus pneumoniae NOT DETECTED NOT DETECTED Final   Streptococcus pyogenes NOT DETECTED NOT DETECTED Final   A.calcoaceticus-baumannii NOT DETECTED NOT DETECTED Final   Bacteroides fragilis NOT DETECTED NOT DETECTED Final   Enterobacterales NOT DETECTED NOT DETECTED Final   Enterobacter cloacae complex NOT DETECTED NOT DETECTED Final   Escherichia coli NOT DETECTED NOT DETECTED Final   Klebsiella aerogenes NOT DETECTED NOT DETECTED Final   Klebsiella oxytoca NOT DETECTED NOT DETECTED Final   Klebsiella pneumoniae NOT DETECTED NOT DETECTED Final   Proteus species NOT DETECTED NOT DETECTED Final   Salmonella species NOT DETECTED NOT DETECTED Final   Serratia marcescens NOT DETECTED NOT DETECTED Final   Haemophilus influenzae NOT DETECTED NOT DETECTED Final   Neisseria meningitidis NOT DETECTED NOT  DETECTED Final   Pseudomonas aeruginosa NOT DETECTED NOT DETECTED Final   Stenotrophomonas maltophilia NOT DETECTED NOT DETECTED Final   Candida albicans NOT DETECTED NOT DETECTED Final   Candida auris NOT DETECTED NOT DETECTED Final   Candida glabrata NOT DETECTED NOT DETECTED Final   Candida krusei NOT DETECTED NOT DETECTED Final   Candida parapsilosis NOT DETECTED NOT DETECTED Final   Candida tropicalis NOT DETECTED NOT DETECTED Final   Cryptococcus neoformans/gattii NOT DETECTED NOT DETECTED Final    Comment: Performed at [facility], [address] [place], [place] [address]  Resp Panel by RT-PCR (Flu A&B, Covid) Nasopharyngeal Swab     Status: None   Collection Time: 05/09/21  9:50 AM   Specimen: Nasopharyngeal Swab; Nasopharyngeal(NP) swabs in vial transport medium  Result Value Ref Range Status   SARS Coronavirus 2 by RT PCR NEGATIVE NEGATIVE Final    Comment:  (NOTE) SARS-CoV-2 target nucleic acids are NOT DETECTED.  The SARS-CoV-2 RNA is generally detectable in upper respiratory specimens during the acute phase of infection. The lowest concentration of SARS-CoV-2 viral copies this assay can detect is 138 copies/mL. A negative result does not preclude SARS-Cov-2 infection and should not be used as the sole basis for treatment or other patient management decisions. A negative result may occur with  improper specimen collection/handling, submission of specimen other than nasopharyngeal swab, presence of viral mutation(s) within the areas targeted by this assay, and inadequate number of viral copies(<138 copies/mL). A negative result must be combined with clinical observations, patient history, and epidemiological information. The expected result is Negative.  Fact Sheet for Patients:  EntrepreneurPulse.com.au  Fact Sheet for Healthcare Providers:  IncredibleEmployment.be  This test is no t yet approved or cleared by the [place] FDA and  has been authorized for detection and/or diagnosis of SARS-CoV-2 by FDA under an Emergency Use Authorization (EUA). This EUA will remain  in effect (meaning this test can be used) for the duration of the COVID-19 declaration under Section 564(b)(1) of the Act, 21 U.S.C.section 360bbb-3(b)(1), unless the authorization is terminated  or revoked sooner.       Influenza A by PCR NEGATIVE NEGATIVE Final   Influenza B by PCR NEGATIVE NEGATIVE Final    Comment: (NOTE) The Xpert Xpress SARS-CoV-2/FLU/RSV plus assay is intended as an aid in the diagnosis of influenza from Nasopharyngeal swab specimens and should not be used as a sole basis for treatment. Nasal washings and aspirates are unacceptable for Xpert Xpress SARS-CoV-2/FLU/RSV testing.  Fact Sheet for Patients: EntrepreneurPulse.com.au  Fact Sheet for Healthcare  Providers: IncredibleEmployment.be  This test is not yet approved or cleared by the [place] FDA and has been authorized for detection and/or diagnosis of SARS-CoV-2 by FDA under an Emergency Use Authorization (EUA). This EUA will remain in effect (meaning this test can be used) for the duration of the COVID-19 declaration under Section 564(b)(1) of the Act, 21 U.S.C. section 360bbb-3(b)(1), unless the authorization is terminated or revoked.  Performed at [facility], [address] [place], [place] [address]      Labs: BNP (last 3 results) Recent Labs    01/03/21 1726 05/09/21 0720  BNP 197.5* 678.9*   Basic Metabolic Panel: Recent Labs  Lab 05/11/21 0644 05/12/21 0625 05/13/21 0440 05/14/21 0547 05/15/21 0520 05/16/21 0501  NA 137 136 138 137 138 135  K 4.0 3.9 3.9 4.2 4.0 4.4  CL 100 100 99 97* 99 98  CO2 33* 31 32 33* 32 33*  GLUCOSE 301* 114* 114* 217* 79 166*  BUN 20 22 24* 22 24* 20  CREATININE 1.01 1.05 1.07 1.12 0.98 1.04  CALCIUM 8.0* 8.0* 8.2* 8.2* 8.3* 8.1*  MG 2.0  --   --   --   --   --    Liver Function Tests: No results for input(s): AST, ALT, ALKPHOS, BILITOT, PROT, ALBUMIN in the last 168 hours. No results for input(s): LIPASE, AMYLASE in the last 168 hours. No results for input(s): AMMONIA in the last 168 hours. CBC: Recent Labs  Lab 05/10/21 0656 05/12/21 0625 05/15/21 0520  WBC 7.0 6.3 6.3  HGB 11.3* 10.3* 10.5*  HCT 37.0* 33.5* 33.7*  MCV 93.7 91.3 90.6  PLT 281 274 262   Cardiac Enzymes: No results for input(s): CKTOTAL, CKMB, CKMBINDEX, TROPONINI in the last 168 hours. BNP: Invalid input(s): POCBNP CBG: Recent Labs  Lab 05/15/21 1018 05/15/21 1247 05/15/21 1710 05/15/21 2007 05/16/21 0751  GLUCAP 138* 101* 176* 245* 136*   D-Dimer No results for input(s): DDIMER in the last 72 hours. Hgb A1c No results for input(s): HGBA1C in the last 72 hours. Lipid Profile No results for  input(s): CHOL, HDL, LDLCALC, TRIG, CHOLHDL, LDLDIRECT in the last 72 hours. Thyroid function studies No results for input(s): TSH, T4TOTAL, T3FREE, THYROIDAB in the last 72 hours.  Invalid input(s): FREET3 Anemia work up No results for input(s): VITAMINB12, FOLATE, FERRITIN, TIBC, IRON, RETICCTPCT in the last 72 hours. Urinalysis    Component Value Date/Time   COLORURINE YELLOW (A) 05/08/2021 2202   APPEARANCEUR CLEAR (A) 05/08/2021 2202   LABSPEC 1.026 05/08/2021 2202   PHURINE 7.0 05/08/2021 2202   GLUCOSEU 150 (A) 05/08/2021 2202   HGBUR NEGATIVE 05/08/2021 2202   BILIRUBINUR NEGATIVE 05/08/2021 2202   KETONESUR 5 (A) 05/08/2021 2202   PROTEINUR >=300 (A) 05/08/2021 2202   NITRITE NEGATIVE 05/08/2021 2202   LEUKOCYTESUR NEGATIVE 05/08/2021 2202   Sepsis Labs Invalid input(s): PROCALCITONIN,  WBC,  LACTICIDVEN Microbiology Recent Results (from the past 240 hour(s))  Culture, blood (Routine x 2)     Status: None   Collection Time: 05/08/21 11:54 AM   Specimen: BLOOD  Result Value Ref Range Status   Specimen Description BLOOD LEFT ANTECUBITAL  Final   Special Requests   Final    BOTTLES DRAWN AEROBIC AND ANAEROBIC Blood Culture adequate volume   Culture   Final    NO GROWTH 5 DAYS Performed at [facility], [address], [place], [place] [address]    Report Status 05/13/2021 FINAL  Final  Culture, blood (Routine x 2)     Status: Abnormal   Collection Time: 05/08/21 12:56 PM   Specimen: BLOOD  Result Value Ref Range Status   Specimen Description   Final    BLOOD BLOOD LEFT FOREARM Performed at [facility], [address] [place], [place] [address]    Special Requests   Final    BOTTLES DRAWN AEROBIC AND ANAEROBIC Blood Culture adequate volume Performed at [facility], [address] [place], [place] [address]    Culture  Setup Time   Final    GRAM POSITIVE COCCI Organism ID to follow AEROBIC BOTTLE ONLY CRITICAL RESULT CALLED  TO, READ BACK BY AND VERIFIED WITH: LISA KOUTZZ 05/09/21 0706 Performed at [facility], [address] [place], [place] [address]    Culture (A)  Final    STAPHYLOCOCCUS HOMINIS THE SIGNIFICANCE OF ISOLATING THIS ORGANISM FROM A SINGLE SET OF BLOOD CULTURES  WHEN MULTIPLE SETS ARE DRAWN IS UNCERTAIN. PLEASE NOTIFY THE MICROBIOLOGY DEPARTMENT WITHIN ONE WEEK IF SPECIATION AND SENSITIVITIES ARE REQUIRED. Performed at [facility], [place] [address], [place], [place] [address]    Report Status 05/11/2021 FINAL  Final  Blood Culture ID Panel (Reflexed)     Status: Abnormal   Collection Time: 05/08/21 12:56 PM  Result Value Ref Range Status   Enterococcus faecalis NOT DETECTED NOT DETECTED Final   Enterococcus Faecium NOT DETECTED NOT DETECTED Final   Listeria monocytogenes NOT DETECTED NOT DETECTED Final   Staphylococcus species DETECTED (A) NOT DETECTED Final    Comment: CRITICAL RESULT CALLED TO, READ BACK BY AND VERIFIED WITH: LISA KOTZZ 05/09/21 0707 MW    Staphylococcus aureus (BCID) NOT DETECTED NOT DETECTED Final   Staphylococcus epidermidis NOT DETECTED NOT DETECTED Final   Staphylococcus lugdunensis NOT DETECTED NOT DETECTED Final   Streptococcus species NOT DETECTED NOT DETECTED Final   Streptococcus agalactiae NOT DETECTED NOT DETECTED Final   Streptococcus pneumoniae NOT DETECTED NOT DETECTED Final   Streptococcus pyogenes NOT DETECTED NOT DETECTED Final   A.calcoaceticus-baumannii NOT DETECTED NOT DETECTED Final   Bacteroides fragilis NOT DETECTED NOT DETECTED Final   Enterobacterales NOT DETECTED NOT DETECTED Final   Enterobacter cloacae complex NOT DETECTED NOT DETECTED Final   Escherichia coli NOT DETECTED NOT DETECTED Final   Klebsiella aerogenes NOT DETECTED NOT DETECTED Final   Klebsiella oxytoca NOT DETECTED NOT DETECTED Final   Klebsiella pneumoniae NOT DETECTED NOT DETECTED Final   Proteus species NOT DETECTED NOT DETECTED Final   Salmonella species  NOT DETECTED NOT DETECTED Final   Serratia marcescens NOT DETECTED NOT DETECTED Final   Haemophilus influenzae NOT DETECTED NOT DETECTED Final   Neisseria meningitidis NOT DETECTED NOT DETECTED Final   Pseudomonas aeruginosa NOT DETECTED NOT DETECTED Final   Stenotrophomonas maltophilia NOT DETECTED NOT DETECTED Final   Candida albicans NOT DETECTED NOT DETECTED Final   Candida auris NOT DETECTED NOT DETECTED Final   Candida glabrata NOT DETECTED NOT DETECTED Final   Candida krusei NOT DETECTED NOT DETECTED Final   Candida parapsilosis NOT DETECTED NOT DETECTED Final   Candida tropicalis NOT DETECTED NOT DETECTED Final   Cryptococcus neoformans/gattii NOT DETECTED NOT DETECTED Final    Comment: Performed at [facility], [address] [place], [place] [address]  Resp Panel by RT-PCR (Flu A&B, Covid) Nasopharyngeal Swab     Status: None   Collection Time: 05/09/21  9:50 AM   Specimen: Nasopharyngeal Swab; Nasopharyngeal(NP) swabs in vial transport medium  Result Value Ref Range Status   SARS Coronavirus 2 by RT PCR NEGATIVE NEGATIVE Final    Comment: (NOTE) SARS-CoV-2 target nucleic acids are NOT DETECTED.  The SARS-CoV-2 RNA is generally detectable in upper respiratory specimens during the acute phase of infection. The lowest concentration of SARS-CoV-2 viral copies this assay can detect is 138 copies/mL. A negative result does not preclude SARS-Cov-2 infection and should not be used as the sole basis for treatment or other patient management decisions. A negative result may occur with  improper specimen collection/handling, submission of specimen other than nasopharyngeal swab, presence of viral mutation(s) within the areas targeted by this assay, and inadequate number of viral copies(<138 copies/mL). A negative result must be combined with clinical observations, patient history, and epidemiological information. The expected result is Negative.  Fact Sheet for  Patients:  EntrepreneurPulse.com.au  Fact Sheet for Healthcare Providers:  IncredibleEmployment.be  This test is no t yet approved or  cleared by the [place] and  has been authorized for detection and/or diagnosis of SARS-CoV-2 by FDA under an Emergency Use Authorization (EUA). This EUA will remain  in effect (meaning this test can be used) for the duration of the COVID-19 declaration under Section 564(b)(1) of the Act, 21 U.S.C.section 360bbb-3(b)(1), unless the authorization is terminated  or revoked sooner.       Influenza A by PCR NEGATIVE NEGATIVE Final   Influenza B by PCR NEGATIVE NEGATIVE Final    Comment: (NOTE) The Xpert Xpress SARS-CoV-2/FLU/RSV plus assay is intended as an aid in the diagnosis of influenza from Nasopharyngeal swab specimens and should not be used as a sole basis for treatment. Nasal washings and aspirates are unacceptable for Xpert Xpress SARS-CoV-2/FLU/RSV testing.  Fact Sheet for Patients: EntrepreneurPulse.com.au  Fact Sheet for Healthcare Providers: IncredibleEmployment.be  This test is not yet approved or cleared by the [place] FDA and has been authorized for detection and/or diagnosis of SARS-CoV-2 by FDA under an Emergency Use Authorization (EUA). This EUA will remain in effect (meaning this test can be used) for the duration of the COVID-19 declaration under Section 564(b)(1) of the Act, 21 U.S.C. section 360bbb-3(b)(1), unless the authorization is terminated or revoked.  Performed at [facility], [address] [place], [place] [address]      Patient was seen and examined on the day of discharge and was found to be in stable condition. Time coordinating discharge: 20 minutes including assessment and coordination of care, as well as examination of the patient.   SIGNED:  Dessa Phi, DO [facility] 05/16/2021, 10:33 AM

## 2021-05-16 NOTE — TOC Transition Note (Signed)
Transition of Care [facility]) - CM/SW Discharge Note   Patient Details  Name: [name]. MRN: [id number] Date of Birth: 1955/05/11  Transition of [facility]) CM/SW Contact:  Alberteen Sam, [place] Phone Number: 05/16/2021, 11:00 AM   Clinical Narrative:     Patient to discharge home today with [place] PT OT and RN, Corene Cornea with [facility], agreeable to accept patient for [facility] PT, OT and RN.    Patient reports he has a rolling walker at home and does not need a 3in1.    Patient reports his spouse will transport him home. NO further discharge needs identified at this time.      Final next level of care: [place] Barriers to Discharge: No Barriers Identified   Patient Goals and CMS Choice Patient states their goals for this hospitalization and ongoing recovery are:: to go home CMS Medicare.gov Compare Post Acute Care list provided to:: Patient Choice offered to / list presented to : Patient  Discharge Placement                    Patient and family notified of of transfer: 05/16/21  Discharge Plan and Services                          HH Arranged: PT, RN [facility] Agency: [place] ([place]) Date [place]: 05/16/21 Time HH Agency Contacted: 1100 Representative spoke with at [place] Aricka Goldberger: [place] ([place]) Interventions     Readmission Risk Interventions No flowsheet data found.

## 2021-05-16 NOTE — Progress Notes (Signed)
Physical Therapy Treatment Patient Details Name: [name]. MRN: [id number] DOB: 1955/10/30 Today's Date: 05/16/2021   History of Present Illness Naresh Althaus. is a 66 y.o. male with PMH of CAD s/p CABG and subsequent PCI in October 2021, HFpEF, hypertension, type 2 diabetes, morbid obesity, PAD, atrial fibrillation who presents with worsening lower extremity edema and weeping of his chronic lower extremity wounds.    PT Comments    Patient alert, sitting at EOB with RN for medication administration, no post op shoes donned. Pt exhibited good sitting balance, but ultimately needed maxA for R and L post op shoe donning. Sit <> stand with supervision, cued to decrease L heel weight bearing as able, pt with difficulty throughout mobility. He ambulated ~39ft in room with BRW, CGA provided but not required. Cued for weight bearing with fair carryover. Pt up in chair with all needs in reach, no further questions at this time. The patient would benefit from further skilled PT intervention to continue to progress towards goals. Recommendation remains appropriate.         Recommendations for follow up therapy are one component of a multi-disciplinary discharge planning process, led by the attending physician.  Recommendations may be updated based on patient status, additional functional criteria and insurance authorization.  Follow Up Recommendations  Home health PT     Assistance Recommended at Discharge Frequent or constant Supervision/Assistance  Patient can return home with the following A little help with bathing/dressing/bathroom;Assistance with cooking/housework;Assist for transportation;Help with stairs or ramp for entrance   Equipment Recommendations  BSC/3in1 (bariatric)    Recommendations for Other Services       Precautions / Restrictions Precautions Precautions: Fall Required Braces or Orthoses: Other Brace Other Brace: L wedge shoe, r post op  shoe Restrictions Weight Bearing Restrictions: Yes LLE Weight Bearing: Non weight bearing Other Position/Activity Restrictions: "NWB to L heel at all times.  L wedge shoe for short distances and should not be putting much weight at all on his left foot" - podiatry note 05/10/21.     Mobility  Bed Mobility Overal bed mobility: Modified Independent             General bed mobility comments: sitting EOB at start of session    Transfers Overall transfer level: Needs assistance Equipment used: Rolling walker (2 wheels) Transfers: Sit to/from Stand Sit to Stand: Supervision           General transfer comment: cued for weight bearing precautions    Ambulation/Gait Ambulation/Gait assistance: Min guard Gait Distance (Feet): 15 Feet Assistive device: Rolling walker (2 wheels)         General Gait Details: cued for forefoot weight bearing as able   Stairs             Wheelchair Mobility    Modified Rankin (Stroke Patients Only)       Balance Overall balance assessment: Needs assistance Sitting-balance support: No upper extremity supported, Feet supported Sitting balance-Leahy Scale: Good Sitting balance - Comments: maxA needed to don bilateral post op shoe     Standing balance-Leahy Scale: Fair                              Cognition Arousal/Alertness: Awake/alert Behavior During Therapy: WFL for tasks assessed/performed Overall Cognitive Status: Within Functional Limits for tasks assessed  General Comments: poor adherence to [facility] pcns        Exercises      General Comments        Pertinent Vitals/Pain Pain Assessment Pain Assessment: 0-10 Pain Score: 6  Pain Location: L foot Pain Descriptors / Indicators: Aching Pain Intervention(s): Limited activity within patient's tolerance, Monitored during session, Repositioned    Home Living                          Prior  Function            PT Goals (current goals can now be found in the care plan section) Progress towards PT goals: Progressing toward goals    Frequency    Min 2X/week      PT Plan Current plan remains appropriate    Co-evaluation              AM-PAC PT "6 Clicks" Mobility   Outcome Measure  Help needed turning from your back to your side while in a flat bed without using bedrails?: None Help needed moving from lying on your back to sitting on the side of a flat bed without using bedrails?: None Help needed moving to and from a bed to a chair (including a wheelchair)?: None Help needed standing up from a chair using your arms (e.g., wheelchair or bedside chair)?: None Help needed to walk in hospital room?: A Little Help needed climbing 3-5 steps with a railing? : A Little 6 Click Score: 22    End of Session   Activity Tolerance: Patient tolerated treatment well Patient left: in chair;with call bell/phone within reach   PT Visit Diagnosis: Unsteadiness on feet (R26.81);Other abnormalities of gait and mobility (R26.89);Difficulty in walking, not elsewhere classified (R26.2)     Time: 4388-8757 PT Time Calculation (min) (ACUTE ONLY): 24 min  Charges:  $Therapeutic Activity: 8-22 mins                     Lieutenant Diego PT, DPT 10:13 AM,05/16/21

## 2021-05-17 ENCOUNTER — Ambulatory Visit: Payer: Medicare Other | Admitting: Family

## 2021-05-18 DIAGNOSIS — L03115 Cellulitis of right lower limb: Secondary | ICD-10-CM | POA: Diagnosis not present

## 2021-05-18 DIAGNOSIS — I480 Paroxysmal atrial fibrillation: Secondary | ICD-10-CM | POA: Diagnosis not present

## 2021-05-18 DIAGNOSIS — I5033 Acute on chronic diastolic (congestive) heart failure: Secondary | ICD-10-CM | POA: Diagnosis not present

## 2021-05-18 DIAGNOSIS — E1165 Type 2 diabetes mellitus with hyperglycemia: Secondary | ICD-10-CM | POA: Diagnosis not present

## 2021-05-18 DIAGNOSIS — I110 Hypertensive heart disease with heart failure: Secondary | ICD-10-CM | POA: Diagnosis not present

## 2021-05-18 DIAGNOSIS — I2510 Atherosclerotic heart disease of native coronary artery without angina pectoris: Secondary | ICD-10-CM | POA: Diagnosis not present

## 2021-05-18 DIAGNOSIS — L89623 Pressure ulcer of left heel, stage 3: Secondary | ICD-10-CM | POA: Diagnosis not present

## 2021-05-18 DIAGNOSIS — L97512 Non-pressure chronic ulcer of other part of right foot with fat layer exposed: Secondary | ICD-10-CM | POA: Diagnosis not present

## 2021-05-18 DIAGNOSIS — L97821 Non-pressure chronic ulcer of other part of left lower leg limited to breakdown of skin: Secondary | ICD-10-CM | POA: Diagnosis not present

## 2021-05-18 DIAGNOSIS — N400 Enlarged prostate without lower urinary tract symptoms: Secondary | ICD-10-CM | POA: Diagnosis not present

## 2021-05-18 DIAGNOSIS — Z4800 Encounter for change or removal of nonsurgical wound dressing: Secondary | ICD-10-CM | POA: Diagnosis not present

## 2021-05-18 DIAGNOSIS — G4733 Obstructive sleep apnea (adult) (pediatric): Secondary | ICD-10-CM | POA: Diagnosis not present

## 2021-05-18 DIAGNOSIS — L03116 Cellulitis of left lower limb: Secondary | ICD-10-CM | POA: Diagnosis not present

## 2021-05-18 DIAGNOSIS — E11621 Type 2 diabetes mellitus with foot ulcer: Secondary | ICD-10-CM | POA: Diagnosis not present

## 2021-05-18 DIAGNOSIS — E1151 Type 2 diabetes mellitus with diabetic peripheral angiopathy without gangrene: Secondary | ICD-10-CM | POA: Diagnosis not present

## 2021-05-18 NOTE — Progress Notes (Signed)
Patient ID: Alec Less., male    DOB: 02-05-1956, 66 y.o.   MRN: [id number]  HPI  [name] is a 66 y/o male with a history of atrial fibrillation, asthma, CAD, DM, hyperlipidemia, HTN, depression, gout, hypogonadism, PVD, COPD, pleurisy, sleep apnea, previous tobacco use and chronic heart failure.   Echo report from 05/10/21 reviewed and showed an EF of 50-55% along with moderate LVH  LHC done 02/02/20 and showed: Mid RCA lesion is 90% stenosed. Prox LAD to Mid LAD lesion is 100% stenosed. Mid Cx lesion is 100% stenosed. Origin to Prox Graft lesion is 100% stenosed.  LM normal LAD-100% proxiimal LCx-100% mid RCA-90% mid  Admitted 05/08/21 due to shortness of breath, pedal edema and weeping of fluids.  LLE angiogram showed no significant stenosis. Vascular surgery, wound and cardiology consults obtained. Given IV antibiotics due to cellulitis. Initially given IV lasix with transition to oral diuretics. Bedside debridement of left foot done by podiatry. Discharged after 8 days.  [name] presents today for his initial visit with a chief complaint of moderate shortness of breath with minimal exertion. [name] describes this as chronic in nature having been present for several years. [name] has associated fatigue, pedal edema, tremors, bilateral feet pain and difficulty sleeping along with this. [name] denies any dizziness, abdominal distention, palpitations, chest pain or cough.   Weighing intermittently. Currently has home health nurse coming to change his UNNA boots and to change the dressing on his heel.   Has been out of eliquis & gabapentin for a couple of weeks so has been taking some old plavix in place of the eliquis. Found out today that a new RX had finally been called in to the pharmacy.   Reports difficulty sleeping due to his restless legs and the pain from his open wounds. Is going to call and see about getting some pain medication called in.  Admits to drinking a lot of fluids daily.  Drinks 2 twelve cup coffee pots of caffeinated coffee daily along with other fluids. Is aware that [name]'s been drinking too much fluids.   Past Medical History:  Diagnosis Date   Arrhythmia    atrial fibrillation   Asthma    CHF (congestive heart failure) (HCC)    COPD (chronic obstructive pulmonary disease) (HCC)    Coronary artery disease    Depression    Diabetes mellitus without complication (HCC)    Gout    History anabolic steroid use    Hyperlipidemia    Hypertension    Hypogonadism in male    Morbid obesity ([place])    Myocardial infarction ([place])    Peripheral vascular disease ([place])    Perirectal abscess    Pleurisy    Sleep apnea    CPAP at night, no oxygen   Varicella    Past Surgical History:  Procedure Laterality Date   ABDOMINAL AORTIC ANEURYSM REPAIR     ACHILLES TENDON SURGERY Left 01/10/2021   Procedure: ACHILLES LENGTHENING/KIDNER;  Surgeon: Caroline More, DPM;  Location: [facility];  Service: Podiatry;  Laterality: Left;   AMPUTATION TOE Right 02/10/2016   Procedure: AMPUTATION TOE 3RD TOE;  Surgeon: Samara Deist, DPM;  Location: [facility];  Service: Podiatry;  Laterality: Right;   AMPUTATION TOE Left 02/24/2020   Procedure: AMPUTATION TOE;  Surgeon: Caroline More, DPM;  Location: [facility];  Service: Podiatry;  Laterality: Left;   APPLICATION OF WOUND VAC Left 02/29/2020   Procedure: APPLICATION OF WOUND VAC;  Surgeon: Caroline More, DPM;  Location: [facility];  Service: Podiatry;  Laterality: Left;   COLONOSCOPY WITH PROPOFOL [name] 11/18/2015   Procedure: COLONOSCOPY WITH PROPOFOL;  Surgeon: Manya Silvas, MD;  Location: [facility];  Service: Endoscopy;  Laterality: [name];   CORONARY ARTERY BYPASS GRAFT     CORONARY STENT INTERVENTION [name] 02/02/2020   Procedure: CORONARY STENT INTERVENTION;  Surgeon: Isaias Cowman, MD;  Location: [place] [facility];  Service: Cardiovascular;  Laterality: [name];   IRRIGATION AND DEBRIDEMENT FOOT Left 02/29/2020   Procedure:  IRRIGATION AND DEBRIDEMENT FOOT;  Surgeon: Caroline More, DPM;  Location: [facility];  Service: Podiatry;  Laterality: Left;   IRRIGATION AND DEBRIDEMENT FOOT Left 02/24/2020   Procedure: IRRIGATION AND DEBRIDEMENT FOOT;  Surgeon: Caroline More, DPM;  Location: [facility];  Service: Podiatry;  Laterality: Left;   KNEE ARTHROSCOPY     LEFT HEART CATH AND CORS/GRAFTS ANGIOGRAPHY [name] 02/02/2020   Procedure: LEFT HEART CATH AND CORS/GRAFTS ANGIOGRAPHY;  Surgeon: Teodoro Spray, MD;  Location: [place] [facility];  Service: Cardiovascular;  Laterality: [name];   LOWER EXTREMITY ANGIOGRAPHY Left 02/25/2020   Procedure: Lower Extremity Angiography;  Surgeon: Algernon Huxley, MD;  Location: [place] [facility];  Service: Cardiovascular;  Laterality: Left;   LOWER EXTREMITY ANGIOGRAPHY Left 01/04/2021   Procedure: LOWER EXTREMITY ANGIOGRAPHY;  Surgeon: Algernon Huxley, MD;  Location: [place] [facility];  Service: Cardiovascular;  Laterality: Left;   METATARSAL HEAD EXCISION Left 01/10/2021   Procedure: METATARSAL HEAD EXCISION - LEFT 5th;  Surgeon: Caroline More, DPM;  Location: [facility];  Service: Podiatry;  Laterality: Left;   PERIPHERAL VASCULAR CATHETERIZATION Right 01/24/2016   Procedure: Lower Extremity Angiography;  Surgeon: Katha Cabal, MD;  Location: [place] [facility];  Service: Cardiovascular;  Laterality: Right;   PERIPHERAL VASCULAR CATHETERIZATION Right 01/25/2016   Procedure: Lower Extremity Angiography;  Surgeon: Katha Cabal, MD;  Location: [place] [facility];  Service: Cardiovascular;  Laterality: Right;   TOE AMPUTATION     TONSILLECTOMY     History reviewed. No pertinent family history. Social History   Tobacco Use   Smoking status: Former    Packs/day: 0.50    Years: 45.00    Pack years: 22.50    Types: Cigarettes    Quit date: 04/05/2015    Years since quitting: 6.1   Smokeless tobacco: Never  Substance Use Topics   Alcohol use: Not Currently    Alcohol/week: 3.0  standard drinks    Types: 3 Glasses of wine per week   Allergies  Allergen Reactions   Statins     Other reaction(s): Muscle Pain Causes legs to ache per pt   Prior to Admission medications   Medication Sig Start Date End Date Taking? Authorizing Provider  amiodarone (PACERONE) 200 MG tablet Take 1 tablet (200 mg total) by mouth 2 (two) times daily. 05/16/21  Yes Dessa Phi, DO  aspirin EC 81 MG EC tablet Take 1 tablet (81 mg total) by mouth daily. Swallow whole. 05/17/21  Yes Dessa Phi, DO  digoxin 62.5 MCG TABS Take 0.0625 mg by mouth daily. 01/16/21  Yes Bonnielee Haff, MD  diltiazem (CARDIZEM CD) 300 MG 24 hr capsule Take 1 capsule (300 mg total) by mouth daily. 01/16/21  Yes Bonnielee Haff, MD  ezetimibe (ZETIA) 10 MG tablet Take 10 mg by mouth daily. 12/19/20 12/19/21 Yes [provider]  ferrous sulfate 325 (65 FE) MG tablet Take 325 mg by mouth daily with breakfast.   Yes [provider]  furosemide (LASIX) 20 MG tablet Take 3 tablets (60 mg total) by mouth daily. 05/17/21  Yes Dessa Phi, DO  insulin aspart (NOVOLOG) 100 UNIT/ML injection Inject 9 Units into the skin 3 (three) times daily with meals. Patient taking differently: Inject 15 Units into the skin 3 (three) times daily with meals. SSI extra if BG high from 3 to 15 units 01/16/21  Yes Bonnielee Haff, MD  isosorbide mononitrate (IMDUR) 60 MG 24 hr tablet Take 60 mg by mouth 2 (two) times daily. 04/21/21  Yes [provider]  LANTUS SOLOSTAR 100 UNIT/ML Solostar Pen Inject 50 Units into the skin 2 (two) times daily. Patient taking differently: Inject 40 Units into the skin 2 (two) times daily. 01/16/21  Yes Bonnielee Haff, MD  metFORMIN (GLUCOPHAGE) 1000 MG tablet Take 1,000 mg by mouth 2 (two) times daily.  12/02/17  Yes [provider]  metoprolol tartrate (LOPRESSOR) 50 MG tablet Take 50 mg by mouth 2 (two) times daily. 04/21/21  Yes [provider]  nitroGLYCERIN  (NITROSTAT) 0.4 MG SL tablet Place 0.4 mg under the tongue daily as needed. 01/25/20  Yes [provider]  potassium chloride (KLOR-CON) 10 MEQ tablet Take 10 mEq by mouth daily. 03/21/21  Yes [provider]  pramipexole (MIRAPEX) 1 MG tablet Take 2 mg by mouth at bedtime. 11/09/20  Yes [provider]  tamsulosin (FLOMAX) 0.4 MG CAPS capsule Take 0.4 mg by mouth daily.   Yes [provider]  apixaban (ELIQUIS) 5 MG TABS tablet Take 1 tablet (5 mg total) by mouth 2 (two) times daily. Patient not taking: Reported on 05/19/2021 01/26/16   Stegmayer, Joelene Millin A, PA-C  gabapentin (NEURONTIN) 300 MG capsule Take 300 mg by mouth at bedtime. Patient not taking: Reported on 05/19/2021 12/02/20   [provider]  Multiple Vitamins-Minerals (MULTIVITAMIN WITH MINERALS) tablet Take 1 tablet by mouth daily. Patient not taking: Reported on 05/19/2021    [provider]  primidone (MYSOLINE) 250 MG tablet Take 250 mg by mouth 2 (two) times daily. Patient not taking: Reported on 05/19/2021 01/23/20   [provider]  vitamin B-12 (CYANOCOBALAMIN) 1000 MCG tablet Take 10,000 mcg by mouth daily. Energy shot Patient not taking: Reported on 05/19/2021    [provider]  zinc gluconate 50 MG tablet Take 50 mg by mouth daily. Patient not taking: Reported on 05/19/2021    [provider]   Review of Systems  Constitutional:  Positive for fatigue (tire easily). Negative for appetite change.  HENT:  Negative for congestion and postnasal drip.   Eyes: Negative.   Respiratory:  Positive for shortness of breath (with minimal exertion). Negative for cough.   Cardiovascular:  Positive for leg swelling. Negative for chest pain and palpitations.  Gastrointestinal:  Negative for abdominal distention and abdominal pain.  Endocrine: Negative.   Genitourinary: Negative.   Musculoskeletal:  Positive for arthralgias (left heel). Negative for back pain.   Skin:  Positive for wound (left heel/ right toe).  Allergic/Immunologic: Negative.   Neurological:  Positive for tremors. Negative for dizziness and light-headedness.  Hematological:  Negative for adenopathy. Bruises/bleeds easily.  Psychiatric/Behavioral:  Positive for sleep disturbance (due to feet pain/ restless leg). Negative for dysphoric mood. The patient is not nervous/anxious.    Vitals:   05/19/21 0912  BP: (!) 141/89  Pulse: (!) 106  Resp: 18  Weight: (!) 379 lb 2 oz (172 kg)  Height: 6\' 4"  (1.93 m)   Wt Readings from  Last 3 Encounters:  05/19/21 (!) 379 lb 2 oz (172 kg)  05/14/21 (!) 398 lb 8 oz (180.8 kg)  01/19/21 (!) 380 lb 8.2 oz (172.6 kg)   Lab Results  Component Value Date   CREATININE 1.04 05/16/2021   CREATININE 0.98 05/15/2021   CREATININE 1.12 05/14/2021    Physical Exam Vitals and nursing note reviewed. Exam conducted with a chaperone present (wife).  Constitutional:      Appearance: Normal appearance.  HENT:     Head: Normocephalic and atraumatic.  Cardiovascular:     Rate and Rhythm: Tachycardia present. Rhythm irregular.  Pulmonary:     Effort: Pulmonary effort is normal. No respiratory distress.     Breath sounds: No wheezing or rales.  Abdominal:     General: There is no distension.     Palpations: Abdomen is soft.  Musculoskeletal:     Cervical back: Normal range of motion.     Right lower leg: Edema (4+ pitting at the knee; wrapped in [place] boot) present.     Left lower leg: Edema (4+ pitting at knee; wrapped in UNNA boot) present.  Skin:    General: Skin is warm and dry.     Comments: Right toe wrapped and left heel wrapped  Neurological:     General: No focal deficit present.     Mental Status: [name] is alert and oriented to person, place, and time.  Psychiatric:        Mood and Affect: Mood normal.        Behavior: Behavior normal.        Thought Content: Thought content normal.    Assessment & Plan:  1: Chronic heart failure  with preserved ejection fraction with structural changes (LVH)- - NYHA class III - fluid overloaded with bilateral pedal edema - has scales but hasn't been weighing daily; instructed to weigh daily and call for an overnight weight gain of > 2 pounds or a weekly weight gain of > 5 pounds - now not adding salt and has been using NoSalt; admits that [name] used to use a lot of salt on his food; explained the importance of keeping daily sodium intake to ~ 2000mg / day - drinking 24 cups of coffee (192 ounces) daily along with other fluids; explained that it was too much fluids that [name]'s drinking and [name] needs to decrease his fluid consumption with the goal of drinking 60-64 ounces daily - not on GDMT; says that [name] was on losartan in the past but is unsure of why [name] was taken off of it; discussed adding entresto or jardiance at next visit - does wear his CPAP nightly - BNP 05/09/21 was 531.5  2: HTN- - BP mildly elevated (141/89) but [name] says that [name]'s in a lot of pain between his restless legs and wounds on his feet; picking up new RX for his gabapentin today - saw PCP ([place]) 03/21/21 - BMP 05/16/21 reviewed and showed sodium 135, potassium 4.4, creatinine 1.04 and GFR >60  3: DM- - saw endocrinology [facility]) 04/28/21 - A1c 05/09/21 was 9.2%  4: Atrial fibrillation- - saw cardiology ([place]) 05/16/21; returns ~ 2 weeks - [name] says that [name]'s been out of eliquis for ~ 2 weeks and has been taking his old plavix in place; pharmacy called and said that his PCP had sent in a prescription for this  5: PAD/ lymphedema- - currently both legs are wrapped in UNNA boots to the knees and getting changed by home health nurse -  says that the swelling is some better in the mornings after his legs have been elevated   Medication list reviewed.   Return in 1 month, sooner if needed.

## 2021-05-19 ENCOUNTER — Encounter: Payer: Self-pay | Admitting: Family

## 2021-05-19 ENCOUNTER — Ambulatory Visit: Payer: Medicare Other | Attending: Family | Admitting: Family

## 2021-05-19 ENCOUNTER — Other Ambulatory Visit: Payer: Self-pay

## 2021-05-19 VITALS — BP 141/89 | HR 106 | Resp 18 | Ht 76.0 in | Wt 379.1 lb

## 2021-05-19 DIAGNOSIS — I10 Essential (primary) hypertension: Secondary | ICD-10-CM

## 2021-05-19 DIAGNOSIS — Z87891 Personal history of nicotine dependence: Secondary | ICD-10-CM | POA: Diagnosis not present

## 2021-05-19 DIAGNOSIS — I2510 Atherosclerotic heart disease of native coronary artery without angina pectoris: Secondary | ICD-10-CM | POA: Insufficient documentation

## 2021-05-19 DIAGNOSIS — E1151 Type 2 diabetes mellitus with diabetic peripheral angiopathy without gangrene: Secondary | ICD-10-CM

## 2021-05-19 DIAGNOSIS — I890 Lymphedema, not elsewhere classified: Secondary | ICD-10-CM | POA: Diagnosis not present

## 2021-05-19 DIAGNOSIS — J449 Chronic obstructive pulmonary disease, unspecified: Secondary | ICD-10-CM | POA: Diagnosis not present

## 2021-05-19 DIAGNOSIS — I480 Paroxysmal atrial fibrillation: Secondary | ICD-10-CM

## 2021-05-19 DIAGNOSIS — I5032 Chronic diastolic (congestive) heart failure: Secondary | ICD-10-CM | POA: Diagnosis not present

## 2021-05-19 DIAGNOSIS — Z7901 Long term (current) use of anticoagulants: Secondary | ICD-10-CM | POA: Diagnosis not present

## 2021-05-19 DIAGNOSIS — I4891 Unspecified atrial fibrillation: Secondary | ICD-10-CM | POA: Diagnosis not present

## 2021-05-19 DIAGNOSIS — M109 Gout, unspecified: Secondary | ICD-10-CM | POA: Diagnosis not present

## 2021-05-19 DIAGNOSIS — G4730 Sleep apnea, unspecified: Secondary | ICD-10-CM | POA: Insufficient documentation

## 2021-05-19 DIAGNOSIS — E785 Hyperlipidemia, unspecified: Secondary | ICD-10-CM | POA: Insufficient documentation

## 2021-05-19 DIAGNOSIS — E291 Testicular hypofunction: Secondary | ICD-10-CM | POA: Insufficient documentation

## 2021-05-19 DIAGNOSIS — I110 Hypertensive heart disease with heart failure: Secondary | ICD-10-CM | POA: Diagnosis not present

## 2021-05-19 DIAGNOSIS — I739 Peripheral vascular disease, unspecified: Secondary | ICD-10-CM

## 2021-05-19 DIAGNOSIS — F32A Depression, unspecified: Secondary | ICD-10-CM | POA: Insufficient documentation

## 2021-05-19 DIAGNOSIS — Z794 Long term (current) use of insulin: Secondary | ICD-10-CM

## 2021-05-19 NOTE — Patient Instructions (Addendum)
Begin weighing daily and call for an overnight weight gain of 3 pounds or more or a weekly weight gain of more than 5 pounds.    [facility] will be moving around the corner to [id number] mid-February. Our phone number will remain the same    Keep daily fluid intake to 60-64 ounces of day

## 2021-05-22 DIAGNOSIS — Z89429 Acquired absence of other toe(s), unspecified side: Secondary | ICD-10-CM | POA: Diagnosis not present

## 2021-05-22 DIAGNOSIS — Z79899 Other long term (current) drug therapy: Secondary | ICD-10-CM | POA: Diagnosis not present

## 2021-05-22 DIAGNOSIS — I5031 Acute diastolic (congestive) heart failure: Secondary | ICD-10-CM | POA: Diagnosis not present

## 2021-05-22 DIAGNOSIS — I110 Hypertensive heart disease with heart failure: Secondary | ICD-10-CM | POA: Diagnosis not present

## 2021-05-22 DIAGNOSIS — I509 Heart failure, unspecified: Secondary | ICD-10-CM | POA: Diagnosis not present

## 2021-05-22 DIAGNOSIS — I517 Cardiomegaly: Secondary | ICD-10-CM | POA: Diagnosis not present

## 2021-05-26 ENCOUNTER — Encounter: Payer: Medicare Other | Attending: Physician Assistant | Admitting: Physician Assistant

## 2021-05-26 ENCOUNTER — Other Ambulatory Visit: Payer: Self-pay

## 2021-05-26 DIAGNOSIS — Z794 Long term (current) use of insulin: Secondary | ICD-10-CM | POA: Diagnosis not present

## 2021-05-26 DIAGNOSIS — L97522 Non-pressure chronic ulcer of other part of left foot with fat layer exposed: Secondary | ICD-10-CM | POA: Insufficient documentation

## 2021-05-26 DIAGNOSIS — I10 Essential (primary) hypertension: Secondary | ICD-10-CM | POA: Diagnosis not present

## 2021-05-26 DIAGNOSIS — Z87891 Personal history of nicotine dependence: Secondary | ICD-10-CM | POA: Insufficient documentation

## 2021-05-26 DIAGNOSIS — L89893 Pressure ulcer of other site, stage 3: Secondary | ICD-10-CM | POA: Diagnosis not present

## 2021-05-26 DIAGNOSIS — I890 Lymphedema, not elsewhere classified: Secondary | ICD-10-CM | POA: Insufficient documentation

## 2021-05-26 DIAGNOSIS — I872 Venous insufficiency (chronic) (peripheral): Secondary | ICD-10-CM | POA: Diagnosis not present

## 2021-05-26 DIAGNOSIS — I5042 Chronic combined systolic (congestive) and diastolic (congestive) heart failure: Secondary | ICD-10-CM | POA: Insufficient documentation

## 2021-05-26 DIAGNOSIS — Z79899 Other long term (current) drug therapy: Secondary | ICD-10-CM | POA: Insufficient documentation

## 2021-05-26 DIAGNOSIS — E11621 Type 2 diabetes mellitus with foot ulcer: Secondary | ICD-10-CM | POA: Diagnosis not present

## 2021-05-26 DIAGNOSIS — L89623 Pressure ulcer of left heel, stage 3: Secondary | ICD-10-CM | POA: Insufficient documentation

## 2021-05-26 DIAGNOSIS — I110 Hypertensive heart disease with heart failure: Secondary | ICD-10-CM | POA: Diagnosis not present

## 2021-05-26 NOTE — Progress Notes (Signed)
KHASIR, WOODROME ([id number]) Visit Report for 05/26/2021 Allergy List Details Patient Name: [name], [name] Date of Service: 05/26/2021 10:00 AM Medical Record Number: [id number] Patient Account Number: [id number] Date of Birth/Sex: 04-28-1956 (66 y.o. M) Treating RN: Donnamarie Poag Primary Care Travonne Schowalter: Fulton Reek Other Clinician: Referring Bird Tailor: Caroline More Treating Rajvi Armentor/Extender: Jeri Cos Weeks in Treatment: 0 Allergies Active Allergies No Known Allergies Allergy Notes Electronic Signature(s) Signed: 05/26/2021 4:05:14 PM By: Donnamarie Poag Entered By: Donnamarie Poag on 05/26/2021 10:57:01 Else, Nanine Means ([id number]) -------------------------------------------------------------------------------- Arrival Information Details Patient Name: Alec Schooner A. Date of Service: 05/26/2021 10:00 AM Medical Record Number: [id number] Patient Account Number: [id number] Date of Birth/Sex: October 09, 1955 (66 y.o. M) Treating RN: Donnamarie Poag Primary Care Terral Cooks: Fulton Reek Other Clinician: Referring Pranit Owensby: Caroline More Treating Maxton Noreen/Extender: Skipper Cliche in Treatment: 0 Visit Information Patient Arrived: Ambulatory Arrival Time: 10:25 Accompanied By: wife Transfer Assistance: EasyPivot Patient Lift Patient Identification Verified: Yes Secondary Verification Process Completed: Yes Patient Requires Transmission-Based No Precautions: Patient Has Alerts: Yes Patient Alerts: Patient on Blood Thinner DIABETIC Eliquis/Aspirin 81 [facility] ABI 06/2020 L 1.0; R 0.88 History Since Last Visit Electronic Signature(s) Signed: 05/26/2021 4:05:14 PM By: Donnamarie Poag Entered By: Donnamarie Poag on 05/26/2021 11:06:52 Moore, Nanine Means ([id number]) -------------------------------------------------------------------------------- Clinic Level of Care Assessment Details Patient Name: [name], [name] Date of Service: 05/26/2021 10:00 AM Medical Record Number:  [id number] Patient Account Number: [id number] Date of Birth/Sex: [birth year] (66 y.o. M) Treating RN: Donnamarie Poag Primary Care Joshua Zeringue: Fulton Reek Other Clinician: Referring Danyel Griess: Caroline More Treating Zacharie Portner/Extender: Skipper Cliche in Treatment: 0 Clinic Level of Care Assessment Items TOOL 1 Quantity Score X - Use when EandM and Procedure is performed on INITIAL visit 1 0 ASSESSMENTS - Nursing Assessment / Reassessment X - General Physical Exam (combine w/ comprehensive assessment (listed just below) when performed on new 1 20 pt. evals) X- 1 25 Comprehensive Assessment (HX, ROS, Risk Assessments, Wounds Hx, etc.) ASSESSMENTS - Wound and Skin Assessment / Reassessment X - Dermatologic / Skin Assessment (not related to wound area) 1 10 ASSESSMENTS - Ostomy and/or Continence Assessment and Care []  - Incontinence Assessment and Management 0 []  - 0 Ostomy Care Assessment and Management (repouching, etc.) PROCESS - Coordination of Care []  - Simple Patient / Family Education for ongoing care 0 X- 1 20 Complex (extensive) Patient / Family Education for ongoing care X- 1 10 Staff obtains Consents, Records, Test Results / Process Orders X- 1 10 Staff telephones HHA, Nursing Homes / Clarify orders / etc []  - 0 Routine Transfer to another Facility (non-emergent condition) []  - 0 Routine Hospital Admission (non-emergent condition) X- 1 15 New Admissions / Biomedical engineer / Ordering NPWT, Apligraf, etc. []  - 0 Emergency Hospital Admission (emergent condition) PROCESS - Special Needs []  - Pediatric / Minor Patient Management 0 []  - 0 Isolation Patient Management []  - 0 Hearing / Language / Visual special needs []  - 0 Assessment of Community assistance (transportation, D/C planning, etc.) []  - 0 Additional assistance / Altered mentation []  - 0 Support Surface(s) Assessment (bed, cushion, seat, etc.) INTERVENTIONS - Miscellaneous []  - External ear exam  0 []  - 0 Patient Transfer (multiple staff / Civil Service fast streamer / Similar devices) []  - 0 Simple Staple / Suture removal (25 or less) []  - 0 Complex Staple / Suture removal (26 or more) []  - 0 Hypo/Hyperglycemic Management (do not check if billed separately) []  - 0 Ankle / Brachial Index (ABI) - do not check if billed separately  Has the patient been seen at the hospital within the last three years: Yes Total Score: 110 Level Of Care: New/Established - Level 3 Auzenne, Dong A. ([id number]) Electronic Signature(s) Signed: 05/26/2021 4:05:14 PM By: Donnamarie Poag Entered By: Donnamarie Poag on 05/26/2021 11:53:28 Debell, Nanine Means ([id number]) -------------------------------------------------------------------------------- Encounter Discharge Information Details Patient Name: Higuchi, [name] Date of Service: 05/26/2021 10:00 AM Medical Record Number: [id number] Patient Account Number: [id number] Date of Birth/Sex: 03/16/1956 (66 y.o. M) Treating RN: Donnamarie Poag Primary Care Mateya Torti: Fulton Reek Other Clinician: Referring Emanuela Runnion: Caroline More Treating Garon Melander/Extender: Skipper Cliche in Treatment: 0 Encounter Discharge Information Items Post Procedure Vitals Discharge Condition: Stable Temperature (F): 98.3 Ambulatory Status: Walker Pulse (bpm): 100 Discharge Destination: Home Respiratory Rate (breaths/min): 18 Transportation: Private Auto Blood Pressure (mmHg): 90/60 Accompanied By: wife Schedule Follow-up Appointment: Yes Clinical Summary of Care: Electronic Signature(s) Signed: 05/26/2021 4:05:14 PM By: Donnamarie Poag Entered By: Donnamarie Poag on 05/26/2021 12:11:16 Zelenak, Nanine Means ([id number]) -------------------------------------------------------------------------------- Lower Extremity Assessment Details Patient Name: Wood, [name] Date of Service: 05/26/2021 10:00 AM Medical Record Number: [id number] Patient Account Number: [id number] Date of Birth/Sex:  18-Dec-1955 (66 y.o. M) Treating RN: Donnamarie Poag Primary Care Iysis Germain: Fulton Reek Other Clinician: Referring Nashea Chumney: Caroline More Treating Navah Grondin/Extender: Skipper Cliche in Treatment: 0 Edema Assessment Assessed: Shirlyn Goltz: Yes] [Right: Yes] Edema: [Left: Yes] [Right: Yes] Calf Left: Right: Point of Measurement: 36 cm From Medial Instep 51 cm 53 cm Ankle Left: Right: Point of Measurement: 12 cm From Medial Instep 30.5 cm 29 cm Knee To Floor Left: Right: From Medial Instep 47 cm 47 cm Vascular Assessment Pulses: Dorsalis Pedis Palpable: [Left:No Yes] [Right:No Yes] Notes ABI at [facility] 06/2020 Electronic Signature(s) Signed: 05/26/2021 4:05:14 PM By: Donnamarie Poag Entered By: Donnamarie Poag on 05/26/2021 79:89:21 Charnley, Nanine Means ([id number]) -------------------------------------------------------------------------------- Multi Wound Chart Details Patient Name: Pereira, Dekendrick A. Date of Service: 05/26/2021 10:00 AM Medical Record Number: [id number] Patient Account Number: [id number] Date of Birth/Sex: 12/13/1955 (66 y.o. M) Treating RN: Donnamarie Poag Primary Care Darus Hershman: Fulton Reek Other Clinician: Referring Davanta Meuser: Caroline More Treating Ilayda Toda/Extender: Skipper Cliche in Treatment: 0 Vital Signs Height(in): 64 Pulse(bpm): 100 Weight(lbs): 371 Blood Pressure(mmHg): 90/60 Body Mass Index(BMI): 48.9 Temperature(F): 98.3 Respiratory Rate(breaths/min): 18 Photos: Wound Location: Left Calcaneus Left, Anterior Toe Second Right Toe Great Wounding Event: Gradually Appeared Gradually Appeared Gradually Appeared Primary Etiology: Pressure Ulcer Diabetic Wound/Ulcer of the Lower Pressure Ulcer Extremity Secondary Etiology: [name] [name] Diabetic Wound/Ulcer of the Lower Extremity Comorbid History: Lymphedema, Asthma, Chronic Lymphedema, Asthma, Chronic Lymphedema, Asthma, Chronic Obstructive Pulmonary Disease Obstructive Pulmonary Disease Obstructive Pulmonary  Disease (COPD), Sleep Apnea, Arrhythmia, (COPD), Sleep Apnea, Arrhythmia, (COPD), Sleep Apnea, Arrhythmia, Congestive Heart Failure, Coronary Congestive Heart Failure, Coronary Congestive Heart Failure, Coronary Artery Disease, Hypertension, Artery Disease, Hypertension, Artery Disease, Hypertension, Myocardial Infarction, Peripheral Myocardial Infarction, Peripheral Myocardial Infarction, Peripheral Venous Disease, Colitis, Type II Venous Disease, Colitis, Type II Venous Disease, Colitis, Type II Diabetes, History of pressure Diabetes, History of pressure Diabetes, History of pressure wounds, Gout, Osteoarthritis wounds, Gout, Osteoarthritis wounds, Gout, Osteoarthritis Date Acquired: 05/05/2021 04/26/2021 04/26/2021 Weeks of Treatment: 0 0 0 Wound Status: Open Open Open Wound Recurrence: No No No Pending Amputation on Yes Yes Yes Presentation: Measurements L x W x D (cm) 2.2x3.3x1 0.3x0.3x0.1 0.8x1x0.1 Area (cm) : 5.702 0.071 0.628 Volume (cm) : 5.702 0.007 0.063 Classification: Category/Stage III Grade 1 Category/Stage III Exudate Amount: Large Medium Medium Exudate Type: Serosanguineous Serosanguineous Serosanguineous Exudate Color: red, brown red, brown red, brown  Granulation Amount: Small (1-33%) Small (1-33%) Large (67-100%) Granulation Quality: Pink Pink Red, Pink Necrotic Amount: Large (67-100%) Large (67-100%) Small (1-33%) Exposed Structures: Fat Layer (Subcutaneous Tissue): Fat Layer (Subcutaneous Tissue): Fat Layer (Subcutaneous Tissue): Yes Yes Yes Fascia: No Fascia: No Fascia: No Tendon: No Tendon: No Tendon: No Muscle: No Muscle: No Muscle: No Joint: No Joint: No Joint: No Bone: No Bone: No Bone: No Treatment Notes Kopec, DAELIN HASTE ([id number]) Electronic Signature(s) Signed: 05/26/2021 4:05:14 PM By: Donnamarie Poag Entered By: Donnamarie Poag on 05/26/2021 11:35:38 Fooks, Nanine Means  ([id number]) -------------------------------------------------------------------------------- Multi-Disciplinary Care Plan Details Patient Name: Alec Schooner A. Date of Service: 05/26/2021 10:00 AM Medical Record Number: [id number] Patient Account Number: [id number] Date of Birth/Sex: [birth year] (66 y.o. M) Treating RN: Donnamarie Poag Primary Care Mikeala Girdler: Fulton Reek Other Clinician: Referring Cherilyn Sautter: Caroline More Treating Nellie Pester/Extender: Skipper Cliche in Treatment: 0 Active Inactive Wound/Skin Impairment Nursing Diagnoses: Impaired tissue integrity Knowledge deficit related to smoking impact on wound healing Knowledge deficit related to ulceration/compromised skin integrity Goals: Patient will have a decrease in wound volume by X% from date: (specify in notes) Date Initiated: 05/26/2021 Target Resolution Date: 06/23/2021 Goal Status: Active Patient/caregiver will verbalize understanding of skin care regimen Date Initiated: 05/26/2021 Target Resolution Date: 06/09/2021 Goal Status: Active Interventions: Assess patient/caregiver ability to obtain necessary supplies Assess patient/caregiver ability to perform ulcer/skin care regimen upon admission and as needed Notes: Electronic Signature(s) Signed: 05/26/2021 4:05:14 PM By: Donnamarie Poag Entered By: Donnamarie Poag on 05/26/2021 11:34:37 Carlile, Nanine Means ([id number]) -------------------------------------------------------------------------------- Non-Wound Condition Assessment Details Patient Name: Darden, Kenshawn A. Date of Service: 05/26/2021 10:00 AM Medical Record Number: [id number] Patient Account Number: [id number] Date of Birth/Sex: 25-Aug-1955 (66 y.o. M) Treating RN: Donnamarie Poag Primary Care Arnice Vanepps: Fulton Reek Other Clinician: Referring Mykale Gandolfo: Caroline More Treating Madine Sarr/Extender: Jeri Cos Weeks in Treatment: 0 Non-Wound Condition: Condition: Lymphedema Location: Leg Side:  Right Photos Electronic Signature(s) Signed: 05/26/2021 4:05:14 PM By: Donnamarie Poag Entered By: Donnamarie Poag on 05/26/2021 11:19:36 Jurich, Nanine Means ([id number]) -------------------------------------------------------------------------------- Non-Wound Condition Assessment Details Patient Name: Grenfell, Jarome A. Date of Service: 05/26/2021 10:00 AM Medical Record Number: [id number] Patient Account Number: [id number] Date of Birth/Sex: Apr 13, 1956 (66 y.o. M) Treating RN: Donnamarie Poag Primary Care Keondrick Dilks: Fulton Reek Other Clinician: Referring Shameeka Silliman: Caroline More Treating Audrielle Vankuren/Extender: Jeri Cos Weeks in Treatment: 0 Non-Wound Condition: Condition: Lymphedema Location: Leg Side: Left Photos Electronic Signature(s) Signed: 05/26/2021 4:05:14 PM By: Donnamarie Poag Entered By: Donnamarie Poag on 05/26/2021 11:20:02 Gornick, Ross Loni Muse ([id number]) -------------------------------------------------------------------------------- Non-Wound Condition Assessment Details Patient Name: Asberry, Mehtaab A. Date of Service: 05/26/2021 10:00 AM Medical Record Number: [id number] Patient Account Number: [id number] Date of Birth/Sex: 04-01-1956 (66 y.o. M) Treating RN: Donnamarie Poag Primary Care Jadence Kinlaw: Fulton Reek Other Clinician: Referring Renleigh Ouellet: Caroline More Treating Lucien Budney/Extender: Skipper Cliche in Treatment: 0 Non-Wound Condition: Condition: Other Dermatologic Condition Location: Foot Side: Right Photos Notes cracked callus Electronic Signature(s) Signed: 05/26/2021 4:05:14 PM By: Donnamarie Poag Entered By: Donnamarie Poag on 05/26/2021 11:20:31 Mcnicholas, Nanine Means ([id number]) -------------------------------------------------------------------------------- Pain Assessment Details Patient Name: Acord, Karel A. Date of Service: 05/26/2021 10:00 AM Medical Record Number: [id number] Patient Account Number: [id number] Date of Birth/Sex: 1956-02-12 (66 y.o. M) Treating  RN: Donnamarie Poag Primary Care Nadean Montanaro: Fulton Reek Other Clinician: Referring Mayrani Khamis: Caroline More Treating Merve Hotard/Extender: Skipper Cliche in Treatment: 0 Active Problems Location of Pain Severity and Description of Pain Patient Has Paino Yes Site Locations Pain Location: Generalized Pain, Pain in Ulcers Rate the pain. Current Pain Level: 5  Pain Management and Medication Current Pain Management: Electronic Signature(s) Signed: 05/26/2021 4:05:14 PM By: Donnamarie Poag Entered By: Donnamarie Poag on 05/26/2021 10:38:08 Weikel, Nanine Means ([id number]) -------------------------------------------------------------------------------- Patient/Caregiver Education Details Patient Name: Alec Schooner A. Date of Service: 05/26/2021 10:00 AM Medical Record Number: [id number] Patient Account Number: [id number] Date of Birth/Gender: 08/19/1955 (66 y.o. M) Treating RN: Donnamarie Poag Primary Care Physician: Fulton Reek Other Clinician: Referring Physician: Caroline More Treating Physician/Extender: Skipper Cliche in Treatment: 0 Education Assessment Education Provided To: Patient and Caregiver Education Topics Provided Basic Hygiene: Elevated Blood Sugar/ Impact on Healing: Infection: Nutrition: Pain: Pressure: Venous: [facility] [place]: Wound Debridement: Wound/Skin Impairment: Electronic Signature(s) Signed: 05/26/2021 4:05:14 PM By: Donnamarie Poag Entered By: Donnamarie Poag on 05/26/2021 11:36:33 Armand, Nanine Means ([id number]) -------------------------------------------------------------------------------- Wound Assessment Details Patient Name: Baldinger, Demico A. Date of Service: 05/26/2021 10:00 AM Medical Record Number: [id number] Patient Account Number: [id number] Date of Birth/Sex: 1956/04/24 (66 y.o. M) Treating RN: Donnamarie Poag Primary Care Mehar Kirkwood: Fulton Reek Other Clinician: Referring Madisyn Mawhinney: Caroline More Treating Wilmer Santillo/Extender:  Skipper Cliche in Treatment: 0 Wound Status Wound Number: 10 Primary Venous Leg Ulcer Etiology: Wound Location: Right, Posterior Lower Leg Wound Open Wounding Event: Gradually Appeared Status: Date Acquired: 05/26/2021 Comorbid Lymphedema, Asthma, Chronic Obstructive Pulmonary Weeks Of Treatment: 0 History: Disease (COPD), Sleep Apnea, Arrhythmia, Congestive Clustered Wound: No Heart Failure, Coronary Artery Disease, Hypertension, Myocardial Infarction, Peripheral Venous Disease, Colitis, Type II Diabetes, History of pressure wounds, Gout, Osteoarthritis Photos Wound Measurements Length: (cm) 1 Width: (cm) 0.8 Depth: (cm) 0.1 Area: (cm) 0.628 Volume: (cm) 0.063 % Reduction in Area: % Reduction in Volume: Tunneling: No Undermining: No Wound Description Classification: Full Thickness Without Exposed Support Structures Exudate Amount: Medium Exudate Type: Serosanguineous Exudate Color: red, brown Foul Odor After Cleansing: No Slough/Fibrino No Wound Bed Granulation Amount: Large (67-100%) Exposed Structure Granulation Quality: Red Fascia Exposed: No Necrotic Amount: Small (1-33%) Fat Layer (Subcutaneous Tissue) Exposed: Yes Necrotic Quality: Adherent Slough Tendon Exposed: No Muscle Exposed: No Joint Exposed: No Bone Exposed: No Treatment Notes Wound #10 (Lower Leg) Wound Laterality: Right, Posterior Cleanser Normal Saline Largent, Ondra A. ([id number]) Discharge Instruction: Wash your hands with soap and water. Remove old dressing, discard into plastic bag and place into trash. Cleanse the wound with Normal Saline prior to applying a clean dressing using gauze sponges, not tissues or cotton balls. Do not scrub or use excessive force. Pat dry using gauze sponges, not tissue or cotton balls. Soap and Water Discharge Instruction: Gently cleanse wound with antibacterial soap, rinse and pat dry prior to dressing wounds Wound Cleanser Discharge Instruction: Wash  your hands with soap and water. Remove old dressing, discard into plastic bag and place into trash. Cleanse the wound with Wound Cleanser prior to applying a clean dressing using gauze sponges, not tissues or cotton balls. Do not scrub or use excessive force. Pat dry using gauze sponges, not tissue or cotton balls. Peri-Wound Care Topical Triamcinolone Acetonide Cream, 0.1%, 15 (g) tube Discharge Instruction: Mixed with half Eucerin or alternative-to bilateral lower legsApply as directed by Loyda Costin. Eucerin lotion Discharge Instruction: Mixed 1/2 with TCA to bilateral lower legs Primary Dressing Iodosorb 40 (g) Discharge Instruction: Apply IodoSorb to wound bed only as directed. Secondary Dressing Zetuvit Plus Silicone Non-bordered 5x5 (in/in) Secured With Compression Wrap Profore Lite LF 3 Multilayer Compression Bandaging System Discharge Instruction: Apply 3 multi-layer wrap as prescribed. Compression Stockings Add-Ons Electronic Signature(s) Signed: 05/26/2021 4:05:14 PM By: Donnamarie Poag Entered By: Donnamarie Poag  on 05/26/2021 11:41:20 Bozich, ULICES MAACK ([id number]) -------------------------------------------------------------------------------- Wound Assessment Details Patient Name: Crunkleton, Yoan A. Date of Service: 05/26/2021 10:00 AM Medical Record Number: [id number] Patient Account Number: [id number] Date of Birth/Sex: [birth year] (66 y.o. M) Treating RN: Donnamarie Poag Primary Care Larra Crunkleton: Fulton Reek Other Clinician: Referring Keyante Durio: Caroline More Treating Sipriano Fendley/Extender: Skipper Cliche in Treatment: 0 Wound Status Wound Number: 7 Primary Pressure Ulcer Etiology: Wound Location: Left Calcaneus Wound Open Wounding Event: Gradually Appeared Status: Date Acquired: 05/05/2021 Comorbid Lymphedema, Asthma, Chronic Obstructive Pulmonary Weeks Of Treatment: 0 History: Disease (COPD), Sleep Apnea, Arrhythmia, Congestive Clustered Wound: No Heart Failure,  Coronary Artery Disease, Hypertension, Pending Amputation On Presentation Myocardial Infarction, Peripheral Venous Disease, Colitis, Type II Diabetes, History of pressure wounds, Gout, Osteoarthritis Photos Wound Measurements Length: (cm) 2.2 % Redu Width: (cm) 3.3 % Redu Depth: (cm) 1 Tunnel Area: (cm) 5.702 Under Volume: (cm) 5.702 ction in Area: ction in Volume: ing: No mining: No Wound Description Classification: Category/Stage III Foul O Exudate Amount: Large Slough Exudate Type: Serosanguineous Exudate Color: red, brown dor After Cleansing: No /Fibrino Yes Wound Bed Granulation Amount: Small (1-33%) Exposed Structure Granulation Quality: Pink Fascia Exposed: No Necrotic Amount: Large (67-100%) Fat Layer (Subcutaneous Tissue) Exposed: Yes Necrotic Quality: Adherent Slough Tendon Exposed: No Muscle Exposed: No Joint Exposed: No Bone Exposed: No Treatment Notes Wound #7 (Calcaneus) Wound Laterality: Left Cleanser Normal Saline Daywalt, Jshaun A. ([id number]) Discharge Instruction: Wash your hands with soap and water. Remove old dressing, discard into plastic bag and place into trash. Cleanse the wound with Normal Saline prior to applying a clean dressing using gauze sponges, not tissues or cotton balls. Do not scrub or use excessive force. Pat dry using gauze sponges, not tissue or cotton balls. Soap and Water Discharge Instruction: Gently cleanse wound with antibacterial soap, rinse and pat dry prior to dressing wounds Wound Cleanser Discharge Instruction: Wash your hands with soap and water. Remove old dressing, discard into plastic bag and place into trash. Cleanse the wound with Wound Cleanser prior to applying a clean dressing using gauze sponges, not tissues or cotton balls. Do not scrub or use excessive force. Pat dry using gauze sponges, not tissue or cotton balls. Peri-Wound Care Topical Triamcinolone Acetonide Cream, 0.1%, 15 (g) tube Discharge  Instruction: Mixed with half Eucerin or alternative-to bilateral lower legsApply as directed by Sebert Stollings. Eucerin lotion Discharge Instruction: Mixed 1/2 with TCA to bilateral lower legs Primary Dressing Iodosorb 40 (g) Discharge Instruction: Apply IodoSorb to wound bed only as directed. Secondary Dressing Zetuvit Plus Silicone Non-bordered 5x5 (in/in) Secured With Compression Wrap Profore Lite LF 3 Multilayer Compression Bandaging System Discharge Instruction: Apply 3 multi-layer wrap as prescribed. Compression Stockings Add-Ons Electronic Signature(s) Signed: 05/26/2021 4:05:14 PM By: Donnamarie Poag Entered By: Donnamarie Poag on 05/26/2021 11:13:32 Racanelli, Nanine Means ([id number]) -------------------------------------------------------------------------------- Wound Assessment Details Patient Name: Hibbitts, Toure A. Date of Service: 05/26/2021 10:00 AM Medical Record Number: [id number] Patient Account Number: [id number] Date of Birth/Sex: 06/10/1955 (66 y.o. M) Treating RN: Donnamarie Poag Primary Care Kouper Spinella: Fulton Reek Other Clinician: Referring Alfonso Carden: Caroline More Treating Truett Mcfarlan/Extender: Skipper Cliche in Treatment: 0 Wound Status Wound Number: 9 Primary Pressure Ulcer Etiology: Wound Location: Right Toe Great Secondary Diabetic Wound/Ulcer of the Lower Extremity Wounding Event: Gradually Appeared Etiology: Date Acquired: 04/26/2021 Wound Open Weeks Of Treatment: 0 Status: Clustered Wound: No Comorbid Lymphedema, Asthma, Chronic Obstructive Pulmonary History: Disease (COPD), Sleep Apnea, Arrhythmia, Congestive Heart Failure, Coronary Artery Disease, Hypertension, Myocardial Infarction, Peripheral Venous Disease, Colitis, Type II  Diabetes, History of pressure wounds, Gout, Osteoarthritis Photos Wound Measurements Length: (cm) 0.8 Width: (cm) 1 Depth: (cm) 0.1 Area: (cm) 0.628 Volume: (cm) 0.063 % Reduction in Area: % Reduction in Volume: Tunneling:  No Undermining: No Wound Description Classification: Category/Stage III Exudate Amount: Medium Exudate Type: Serosanguineous Exudate Color: red, brown Foul Odor After Cleansing: No Slough/Fibrino Yes Wound Bed Granulation Amount: Large (67-100%) Exposed Structure Granulation Quality: Red, Pink Fascia Exposed: No Necrotic Amount: Small (1-33%) Fat Layer (Subcutaneous Tissue) Exposed: Yes Necrotic Quality: Adherent Slough Tendon Exposed: No Muscle Exposed: No Joint Exposed: No Bone Exposed: No Electronic Signature(s) Signed: 05/26/2021 4:05:14 PM By: Donnamarie Poag Entered By: Donnamarie Poag on 05/26/2021 11:18:03 Rudder, Nanine Means ([id number]) -------------------------------------------------------------------------------- [place] Details Patient Name: Hannan, Blaike A. Date of Service: 05/26/2021 10:00 AM Medical Record Number: [id number] Patient Account Number: [id number] Date of Birth/Sex: 07/03/1955 (66 y.o. M) Treating RN: Donnamarie Poag Primary Care Natausha Jungwirth: Fulton Reek Other Clinician: Referring Yarielys Beed: Caroline More Treating Deliliah Spranger/Extender: Skipper Cliche in Treatment: 0 Vital Signs Time Taken: 10:40 Temperature (F): 98.3 Height (in): 73 Pulse (bpm): 100 Source: Stated Respiratory Rate (breaths/min): 18 Weight (lbs): 371 Blood Pressure (mmHg): 90/60 Source: Stated Reference Range: 80 - 120 mg / dl Body Mass Index (BMI): 48.9 Notes unable to stand on scales; stated daily weights at home Denies dizziness; stated will monitor BP and inform cardiology as recent changes to Metoprolol increase Electronic Signature(s) Signed: 05/26/2021 4:05:14 PM By: Donnamarie Poag Entered ByDonnamarie Poag on 05/26/2021 10:42:56

## 2021-05-26 NOTE — Progress Notes (Signed)
JAIREN, GOLDFARB ([id number]) Visit Report for 05/26/2021 Chief Complaint Document Details Patient Name: Alec, Wood Wood. Date of Service: 05/26/2021 10:00 AM Medical Record Number: [id number] Patient Account Number: [id number] Date of Birth/Sex: [birth year] (66 y.o. M) Treating RN: Donnamarie Poag Primary Care Provider: Fulton Reek Other Clinician: Referring Provider: Caroline More Treating Provider/Extender: Skipper Cliche in Treatment: 0 Information Obtained from: Patient Chief Complaint Bilateral LE Ulcers Electronic Signature(s) Signed: 05/26/2021 11:27:52 AM By: Worthy Keeler PA-C Entered By: Worthy Keeler on 05/26/2021 11:27:52 Baiz, Nanine Means ([id number]) -------------------------------------------------------------------------------- Debridement Details Patient Name: [name]. Date of Service: 05/26/2021 10:00 AM Medical Record Number: [id number] Patient Account Number: [id number] Date of Birth/Sex: [birth year] (66 y.o. M) Treating RN: Donnamarie Poag Primary Care Provider: Fulton Reek Other Clinician: Referring Provider: Caroline More Treating Provider/Extender: Skipper Cliche in Treatment: 0 Debridement Performed for Wound #7 Left Calcaneus Assessment: Performed By: Physician Tommie Sams., PA-C Debridement Type: Debridement Level of Consciousness (Pre- Awake and Alert procedure): Pre-procedure Verification/Time Out Yes - 11:44 Taken: Start Time: 11:45 Pain Control: Lidocaine Total Area Debrided (L x W): 2.2 (cm) x 3.3 (cm) = 7.26 (cm) Tissue and other material Viable, Non-Viable, Slough, Subcutaneous, Slough debrided: Level: Skin/Subcutaneous Tissue Debridement Description: Excisional Instrument: Curette Bleeding: Moderate Hemostasis Achieved: Pressure Response to Treatment: Procedure was tolerated well Level of Consciousness (Post- Awake and Alert procedure): Post Debridement Measurements of Total Wound Length: (cm)  2.2 Stage: Category/Stage III Width: (cm) 3.3 Depth: (cm) 1 Volume: (cm) 5.702 Character of Wound/Ulcer Post Debridement: Improved Post Procedure Diagnosis Same as Pre-procedure Electronic Signature(s) Signed: 05/26/2021 4:05:14 PM By: Donnamarie Poag Signed: 05/26/2021 4:30:05 PM By: Worthy Keeler PA-C Entered By: Donnamarie Poag on 05/26/2021 11:48:28 Betty, Nanine Means ([id number]) -------------------------------------------------------------------------------- HPI Details Patient Name: [name], [name]. Date of Service: 05/26/2021 10:00 AM Medical Record Number: [id number] Patient Account Number: [id number] Date of Birth/Sex: 12-27-1955 (66 y.o. M) Treating RN: Donnamarie Poag Primary Care Provider: Fulton Reek Other Clinician: Referring Provider: Caroline More Treating Provider/Extender: Skipper Cliche in Treatment: 0 History of Present Illness HPI Description: Admission 8/31 [name] is Wood 66 year old male with Wood past medical history of uncontrolled insulin-dependent type 2 diabetes, CAD, osteomyelitis to the left third toe status post partial third ray amputation, and peripheral arterial disease that presents for Wood left plantar foot wound and bilateral lower extremity wounds. The left plantar foot wound has been present for at least 7 months. He has been following podiatry for this issue. He uses silver collagen to this area. He reports that in the past 1 to 2 months he has developed multiple scattered open wounds to his lower extremities bilaterally that have worsened over time. He states that they often develop as either blisters or he hits his leg against an object.He is currently using Betadine to these areas. He denies signs of infection. He has compression stockings however he cannot put these on very easily. Also he is on his feet most of the day. He states he does not have anyone to help him do the activities he needs to get done. Readmission: 05/26/2021 upon  evaluation today patient presents for reevaluation here in the clinic concerning issues he is been having with his bilateral lower extremities, his right great toe, and left heel primarily. With that being said he has been in the hospital recently and his hemoglobin A1c on January 10 was 9.2. With that being said he does have Wood lot of really thickened dry skin attached to  his legs when he try to remove that today. He is in agreement with the plan. Nonetheless I do believe that based on what we are seeing he is been having Wood lot of issues with his congestive heart failure I do have him on fluid pills he does go to the bathroom quite Wood bit. Subsequently has been in [employer] but I do not think that is doing quite enough to help control his edema. He also needs something better than Santyl to put on the heel right now when he was in the hospital they actually cut Wood space open in the compression around his heel so they can open it up and actually apply the Santyl daily. I think he needs something better than that. Again the patient does have Wood history of diabetes mellitus type 2, chronic venous insufficiency, hypertension, and congestive heart failure. He also has signs of stage III lymphedema as well. Electronic Signature(s) Signed: 05/26/2021 12:55:10 PM By: Worthy Keeler PA-C Entered By: Worthy Keeler on 05/26/2021 12:55:10 Conwell, Nanine Means ([id number]) -------------------------------------------------------------------------------- Physical Exam Details Patient Name: [name], [name]. Date of Service: 05/26/2021 10:00 AM Medical Record Number: [id number] Patient Account Number: [id number] Date of Birth/Sex: 1956-04-09 (66 y.o. M) Treating RN: Donnamarie Poag Primary Care Provider: Fulton Reek Other Clinician: Referring Provider: Caroline More Treating Provider/Extender: Skipper Cliche in Treatment: 0 Constitutional patient is hypotensive.. pulse regular and within target range  for patient.Marland Kitchen respirations regular, non-labored and within target range for patient.Marland Kitchen temperature within target range for patient.. Well-nourished and well-hydrated in no acute distress. Eyes conjunctiva clear no eyelid edema noted. pupils equal round and reactive to light and accommodation. Ears, Nose, Mouth, and Throat no gross abnormality of ear auricles or external auditory canals. normal hearing noted during conversation. mucus membranes moist. Respiratory normal breathing without difficulty. Cardiovascular 1+ dorsalis pedis/posterior tibialis pulses. 1+ pitting edema of the bilateral lower extremities. Musculoskeletal normal gait and posture. no significant deformity or arthritic changes, no loss or range of motion, no clubbing. Psychiatric this patient is able to make decisions and demonstrates good insight into disease process. Alert and Oriented x 3. pleasant and cooperative. Notes Upon inspection patient's wound bed actually showed signs of necrotic tissue on the heel I did actually perform some debridement to clear this away today. I do believe the Iodosorb/Iodoflex will probably be Wood better way to go. That way we do not have to take the compression off. Home health should be able to help with this as well with dressing changes on Monday and Wednesday and then we can do Friday to get them 3 times per week. I do believe he needs to have this changed frequently due to how much this is draining. Electronic Signature(s) Signed: 05/26/2021 12:56:50 PM By: Worthy Keeler PA-C Entered By: Worthy Keeler on 05/26/2021 12:56:49 Meikle, Nanine Means ([id number]) -------------------------------------------------------------------------------- Physician Orders Details Patient Name: [name]. Date of Service: 05/26/2021 10:00 AM Medical Record Number: [id number] Patient Account Number: [id number] Date of Birth/Sex: [birth year] (67 y.o. M) Treating RN: Donnamarie Poag Primary Care  Provider: Fulton Reek Other Clinician: Referring Provider: Caroline More Treating Provider/Extender: Skipper Cliche in Treatment: 0 Verbal / Phone Orders: No Diagnosis Coding ICD-10 Coding Code Description [phone] Pressure ulcer of left heel, stage 3 E11.621 Type 2 diabetes mellitus with foot ulcer L97.522 Non-pressure chronic ulcer of other part of left foot with fat layer exposed L89.893 Pressure ulcer of other site, stage 3 I87.2 Venous insufficiency (chronic) (peripheral)  I50.42 Chronic combined systolic (congestive) and diastolic (congestive) heart failure I10 Essential (primary) hypertension Follow-up Appointments o Return Appointment in 1 week. o Nurse Visit as needed [place] for wound care. May utilize formulary equivalent dressing for wound treatment orders unless otherwise specified. Home Health Nurse may visit [facility] to address patientos wound care needs. o Scheduled days for dressing changes to be completed; exception, patient has scheduled wound care visit that day. - HH to wrap twice weekly and at wound center one time unless he is unable to have appt at wound center that week o **Please direct any NON-WOUND related issues/requests for orders to patient's Primary Care Physician. **If current dressing causes regression in wound condition, may D/C ordered dressing product/s and apply Normal Saline Moist Dressing daily until next [place] or Other MD appointment. **Notify Wound Healing Center of regression in wound condition at [phone]. Bathing/ Shower/ Hygiene o May shower with wound dressing protected with water repellent cover or cast protector. o No tub bath. Anesthetic (Use 'Patient Medications' Section for Anesthetic Order Entry) o Lidocaine applied to wound bed Edema Control - Lymphedema / Segmental Compressive Device / Other o Elevate legs to the level of the  heart and pump ankles as often as possible o Elevate leg(s) parallel to the floor when sitting. o DO YOUR BEST to sleep in the bed at night. DO NOT sleep in your recliner. Long hours of sitting in Wood recliner leads to swelling of the legs and/or potential wounds on your backside. Non-Wound Condition o Additional non-wound orders/instructions: - Triamcinolone mixed with lotion to bilateral lower legs Off-Loading o Open toe surgical shoe with peg assist. - L heel wedge shoe o Turn and reposition every 2 hours Additional Orders / Instructions o Follow Nutritious Diet and Increase Protein Intake - monitor blood sugar Wound Treatment Wound #10 - Lower Leg Wound Laterality: Right, Posterior Cleanser: Normal Saline 3 x Per Week/15 Days Discharge Instructions: Wash your hands with soap and water. Remove old dressing, discard into plastic bag and place into trash. Cleanse the wound with Normal Saline prior to applying Wood clean dressing using gauze sponges, not tissues or cotton balls. Do not scrub or use excessive force. Pat dry using gauze sponges, not tissue or cotton balls. Baumgardner, ROCHESTER SERPE ([id number]) Cleanser: Soap and Water 3 x Per Week/15 Days Discharge Instructions: Gently cleanse wound with antibacterial soap, rinse and pat dry prior to dressing wounds Cleanser: Wound Cleanser 3 x Per Week/15 Days Discharge Instructions: Wash your hands with soap and water. Remove old dressing, discard into plastic bag and place into trash. Cleanse the wound with Wound Cleanser prior to applying Wood clean dressing using gauze sponges, not tissues or cotton balls. Do not scrub or use excessive force. Pat dry using gauze sponges, not tissue or cotton balls. Topical: Triamcinolone Acetonide Cream, 0.1%, 15 (g) tube 3 x Per Week/15 Days Discharge Instructions: Mixed with half Eucerin or alternative-to bilateral lower legsApply as directed by provider. Topical: Eucerin lotion 3 x Per Week/15  Days Discharge Instructions: Mixed 1/2 with TCA to bilateral lower legs Primary Dressing: Iodosorb 40 (g) 3 x Per Week/15 Days Discharge Instructions: Apply IodoSorb to wound bed only as directed. Secondary Dressing: Zetuvit Plus Silicone Non-bordered 5x5 (in/in) 3 x Per Week/15 Days Compression Wrap: Profore Lite LF 3 Multilayer Compression Bandaging System 3 x Per Week/15 Days Discharge Instructions: Apply 3 multi-layer wrap as prescribed. Wound #7 - Calcaneus Wound  Laterality: Left Cleanser: Normal Saline 3 x Per Week/15 Days Discharge Instructions: Wash your hands with soap and water. Remove old dressing, discard into plastic bag and place into trash. Cleanse the wound with Normal Saline prior to applying Wood clean dressing using gauze sponges, not tissues or cotton balls. Do not scrub or use excessive force. Pat dry using gauze sponges, not tissue or cotton balls. Cleanser: Soap and Water 3 x Per Week/15 Days Discharge Instructions: Gently cleanse wound with antibacterial soap, rinse and pat dry prior to dressing wounds Cleanser: Wound Cleanser 3 x Per Week/15 Days Discharge Instructions: Wash your hands with soap and water. Remove old dressing, discard into plastic bag and place into trash. Cleanse the wound with Wound Cleanser prior to applying Wood clean dressing using gauze sponges, not tissues or cotton balls. Do not scrub or use excessive force. Pat dry using gauze sponges, not tissue or cotton balls. Topical: Triamcinolone Acetonide Cream, 0.1%, 15 (g) tube 3 x Per Week/15 Days Discharge Instructions: Mixed with half Eucerin or alternative-to bilateral lower legsApply as directed by provider. Topical: Eucerin lotion 3 x Per Week/15 Days Discharge Instructions: Mixed 1/2 with TCA to bilateral lower legs Primary Dressing: Iodosorb 40 (g) 3 x Per Week/15 Days Discharge Instructions: Apply IodoSorb to wound bed only as directed. Secondary Dressing: Zetuvit Plus Silicone Non-bordered 5x5  (in/in) 3 x Per Week/15 Days Compression Wrap: Profore Lite LF 3 Multilayer Compression Bandaging System 3 x Per Week/15 Days Discharge Instructions: Apply 3 multi-layer wrap as prescribed. Wound #9 - Toe Great Wound Laterality: Right Cleanser: Normal Saline 3 x Per Week/15 Days Discharge Instructions: Wash your hands with soap and water. Remove old dressing, discard into plastic bag and place into trash. Cleanse the wound with Normal Saline prior to applying Wood clean dressing using gauze sponges, not tissues or cotton balls. Do not scrub or use excessive force. Pat dry using gauze sponges, not tissue or cotton balls. Cleanser: Soap and Water 3 x Per Week/15 Days Discharge Instructions: Gently cleanse wound with antibacterial soap, rinse and pat dry prior to dressing wounds Cleanser: Wound Cleanser 3 x Per Week/15 Days Discharge Instructions: Wash your hands with soap and water. Remove old dressing, discard into plastic bag and place into trash. Cleanse the wound with Wound Cleanser prior to applying Wood clean dressing using gauze sponges, not tissues or cotton balls. Do not scrub or use excessive force. Pat dry using gauze sponges, not tissue or cotton balls. Topical: Triamcinolone Acetonide Cream, 0.1%, 15 (g) tube 3 x Per Week/15 Days Discharge Instructions: Mixed with half Eucerin or alternative-to bilateral lower legsApply as directed by provider. Topical: Eucerin lotion 3 x Per Week/15 Days [name], [name]. ([id number]) Discharge Instructions: Mixed 1/2 with TCA to bilateral lower legs Primary Dressing: Iodosorb 40 (g) 3 x Per Week/15 Days Discharge Instructions: Apply IodoSorb to wound bed only as directed. Secondary Dressing: Zetuvit Plus Silicone Non-bordered 5x5 (in/in) 3 x Per Week/15 Days Compression Wrap: Profore Lite LF 3 Multilayer Compression Bandaging System 3 x Per Week/15 Days Discharge Instructions: Apply 3 multi-layer wrap as prescribed. Patient Medications Allergies: No  Known Allergies Notifications Medication Indication Start End triamcinolone acetonide 05/26/2021 DOSE topical 0.1 % ointment - ointment topical applied to the legs mixed 1:1 with eucerin by the home health nurse to be applied covering the legs before wraps 3x weekly Electronic Signature(s) Signed: 05/26/2021 1:00:35 PM By: Worthy Keeler PA-C Entered By: Worthy Keeler on 05/26/2021 13:00:34 Nygaard, Nanine Means ([id number]) -------------------------------------------------------------------------------- Problem  List Details Patient Name: ALTUS, [name]. Date of Service: 05/26/2021 10:00 AM Medical Record Number: [id number] Patient Account Number: [id number] Date of Birth/Sex: [birth year] (66 y.o. M) Treating RN: Donnamarie Poag Primary Care Provider: Fulton Reek Other Clinician: Referring Provider: Caroline More Treating Provider/Extender: Skipper Cliche in Treatment: 0 Active Problems ICD-10 Encounter Code Description Active Date MDM Diagnosis [phone] Pressure ulcer of left heel, stage 3 05/26/2021 No Yes E11.621 Type 2 diabetes mellitus with foot ulcer 05/26/2021 No Yes L97.522 Non-pressure chronic ulcer of other part of left foot with fat layer 05/26/2021 No Yes exposed L89.893 Pressure ulcer of other site, stage 3 05/26/2021 No Yes I87.2 Venous insufficiency (chronic) (peripheral) 05/26/2021 No Yes I50.42 Chronic combined systolic (congestive) and diastolic (congestive) heart 05/26/2021 No Yes failure I10 Essential (primary) hypertension 05/26/2021 No Yes Inactive Problems Resolved Problems Electronic Signature(s) Signed: 05/26/2021 11:27:28 AM By: Worthy Keeler PA-C Entered By: Worthy Keeler on 05/26/2021 11:27:28 Crawshaw, Nanine Means ([id number]) -------------------------------------------------------------------------------- Progress Note Details Patient Name: Snellgrove, Abdulahad Wood. Date of Service: 05/26/2021 10:00 AM Medical Record Number: [id number] Patient Account  Number: [id number] Date of Birth/Sex: 1955/05/10 (66 y.o. M) Treating RN: Donnamarie Poag Primary Care Provider: Fulton Reek Other Clinician: Referring Provider: Caroline More Treating Provider/Extender: Skipper Cliche in Treatment: 0 Subjective Chief Complaint Information obtained from Patient Bilateral LE Ulcers History of Present Illness (HPI) Admission 8/31 Mr. meldon hanzlik is Wood 66 year old male with Wood past medical history of uncontrolled insulin-dependent type 2 diabetes, CAD, osteomyelitis to the left third toe status post partial third ray amputation, and peripheral arterial disease that presents for Wood left plantar foot wound and bilateral lower extremity wounds. The left plantar foot wound has been present for at least 7 months. He has been following podiatry for this issue. He uses silver collagen to this area. He reports that in the past 1 to 2 months he has developed multiple scattered open wounds to his lower extremities bilaterally that have worsened over time. He states that they often develop as either blisters or he hits his leg against an object.He is currently using Betadine to these areas. He denies signs of infection. He has compression stockings however he cannot put these on very easily. Also he is on his feet most of the day. He states he does not have anyone to help him do the activities he needs to get done. Readmission: 05/26/2021 upon evaluation today patient presents for reevaluation here in the clinic concerning issues he is been having with his bilateral lower extremities, his right great toe, and left heel primarily. With that being said he has been in the hospital recently and his hemoglobin A1c on January 10 was 9.2. With that being said he does have Wood lot of really thickened dry skin attached to his legs when he try to remove that today. He is in agreement with the plan. Nonetheless I do believe that based on what we are seeing he is been having Wood lot of  issues with his congestive heart failure I do have him on fluid pills he does go to the bathroom quite Wood bit. Subsequently has been in [employer] but I do not think that is doing quite enough to help control his edema. He also needs something better than Santyl to put on the heel right now when he was in the hospital they actually cut Wood space open in the compression around his heel so they can open it up and actually apply the Santyl daily.  I think he needs something better than that. Again the patient does have Wood history of diabetes mellitus type 2, chronic venous insufficiency, hypertension, and congestive heart failure. He also has signs of stage III lymphedema as well. Patient History Information obtained from Patient. Allergies No Known Allergies Social History Former smoker - ended on 05/01/2015, Marital Status - Married, Alcohol Use - Rarely, Drug Use - Prior History, Caffeine Use - Daily. Medical History Hematologic/Lymphatic Patient has history of Lymphedema Respiratory Patient has history of Asthma, Chronic Obstructive Pulmonary Disease (COPD), Sleep Apnea Cardiovascular Patient has history of Arrhythmia, Congestive Heart Failure, Coronary Artery Disease, Hypertension, Myocardial Infarction, Peripheral Venous Disease Gastrointestinal Patient has history of Colitis - IBS Endocrine Patient has history of Type II Diabetes Integumentary (Skin) Patient has history of History of pressure wounds Musculoskeletal Patient has history of Gout, Osteoarthritis Patient is treated with Insulin, Oral Agents. Blood sugar is tested. Blood sugar results noted at the following times: Breakfast - 153. Review of Systems (ROS) Constitutional Symptoms (General Health) Denies complaints or symptoms of Fatigue, Fever, Chills, Marked Weight Change. Eyes Complains or has symptoms of Vision Changes. Ear/Nose/Mouth/Throat Denies complaints or symptoms of Difficult clearing ears,  Sinusitis. Respiratory Kogler, Topher Wood. ([id number]) Complains or has symptoms of Shortness of Breath. Cardiovascular Complains or has symptoms of LE edema. Genitourinary Denies complaints or symptoms of Kidney failure/ Dialysis, Incontinence/dribbling. Immunological Denies complaints or symptoms of Hives, Itching. Integumentary (Skin) Complains or has symptoms of Wounds, Breakdown, Swelling. Denies complaints or symptoms of Bleeding or bruising tendency. Neurologic Denies complaints or symptoms of Numbness/parasthesias, Focal/Weakness. Psychiatric Denies complaints or symptoms of Anxiety, Claustrophobia. Objective Constitutional patient is hypotensive.. pulse regular and within target range for patient.Marland Kitchen respirations regular, non-labored and within target range for patient.Marland Kitchen temperature within target range for patient.. Well-nourished and well-hydrated in no acute distress. Vitals Time Taken: 10:40 AM, Height: 73 in, Source: Stated, Weight: 371 lbs, Source: Stated, BMI: 48.9, Temperature: 98.3 F, Pulse: 100 bpm, Respiratory Rate: 18 breaths/min, Blood Pressure: 90/60 mmHg. General Notes: unable to stand on scales; stated daily weights at home Denies dizziness; stated will monitor BP and inform cardiology as recent changes to Metoprolol increase Eyes conjunctiva clear no eyelid edema noted. pupils equal round and reactive to light and accommodation. Ears, Nose, Mouth, and Throat no gross abnormality of ear auricles or external auditory canals. normal hearing noted during conversation. mucus membranes moist. Respiratory normal breathing without difficulty. Cardiovascular 1+ dorsalis pedis/posterior tibialis pulses. 1+ pitting edema of the bilateral lower extremities. Musculoskeletal normal gait and posture. no significant deformity or arthritic changes, no loss or range of motion, no clubbing. Psychiatric this patient is able to make decisions and demonstrates good insight into  disease process. Alert and Oriented x 3. pleasant and cooperative. General Notes: Upon inspection patient's wound bed actually showed signs of necrotic tissue on the heel I did actually perform some debridement to clear this away today. I do believe the Iodosorb/Iodoflex will probably be Wood better way to go. That way we do not have to take the compression off. Home health should be able to help with this as well with dressing changes on Monday and Wednesday and then we can do Friday to get them 3 times per week. I do believe he needs to have this changed frequently due to how much this is draining. Integumentary (Hair, Skin) Wound #10 status is Open. Original cause of wound was Gradually Appeared. The date acquired was: 05/26/2021. The wound is located on the [place]  Leg. The wound measures 1cm length x 0.8cm width x 0.1cm depth; 0.628cm^2 area and 0.063cm^3 volume. There is Fat Layer (Subcutaneous Tissue) exposed. There is no tunneling or undermining noted. There is Wood medium amount of serosanguineous drainage noted. There is large (67-100%) red granulation within the wound bed. There is Wood small (1-33%) amount of necrotic tissue within the wound bed including Adherent Slough. Wound #7 status is Open. Original cause of wound was Gradually Appeared. The date acquired was: 05/05/2021. The wound is located on the Left Calcaneus. The wound measures 2.2cm length x 3.3cm width x 1cm depth; 5.702cm^2 area and 5.702cm^3 volume. There is Fat Layer (Subcutaneous Tissue) exposed. There is no tunneling or undermining noted. There is Wood large amount of serosanguineous drainage noted. There is small (1-33%) pink granulation within the wound bed. There is Wood large (67-100%) amount of necrotic tissue within the wound bed including Adherent Slough. Wound #9 status is Open. Original cause of wound was Gradually Appeared. The date acquired was: 04/26/2021. The wound is located on the Right Toe Great. The wound  measures 0.8cm length x 1cm width x 0.1cm depth; 0.628cm^2 area and 0.063cm^3 volume. There is Fat Layer (Subcutaneous Tissue) exposed. There is no tunneling or undermining noted. There is Wood medium amount of serosanguineous drainage noted. There is large (67-100%) red, pink granulation within the wound bed. There is Wood small (1-33%) amount of necrotic tissue within the wound bed including Adherent Slough. [name], [name]. ([id number]) Other Condition(s) Patient presents with Lymphedema located on the Right Leg. Patient presents with Lymphedema located on the Left Leg. Patient presents with Other Dermatologic Condition located on the Right Foot. General Notes: cracked callus Assessment Active Problems ICD-10 Pressure ulcer of left heel, stage 3 Type 2 diabetes mellitus with foot ulcer Non-pressure chronic ulcer of other part of left foot with fat layer exposed Pressure ulcer of other site, stage 3 Venous insufficiency (chronic) (peripheral) Chronic combined systolic (congestive) and diastolic (congestive) heart failure Essential (primary) hypertension Procedures Wound #7 Pre-procedure diagnosis of Wound #7 is Wood Pressure Ulcer located on the Left Calcaneus . There was Wood Excisional Skin/Subcutaneous Tissue Debridement with Wood total area of 7.26 sq cm performed by Tommie Sams., PA-C. With the following instrument(s): Curette to remove Viable and Non-Viable tissue/material. Material removed includes Subcutaneous Tissue and Slough and after achieving pain control using Lidocaine. Wood time out was conducted at 11:44, prior to the start of the procedure. Wood Moderate amount of bleeding was controlled with Pressure. The procedure was tolerated well. Post Debridement Measurements: 2.2cm length x 3.3cm width x 1cm depth; 5.702cm^3 volume. Post debridement Stage noted as Category/Stage III. Character of Wound/Ulcer Post Debridement is improved. Post procedure Diagnosis Wound #7: Same as  Pre-Procedure Plan Follow-up Appointments: Return Appointment in 1 week. Nurse Visit as needed Home Health: [facility]: - [facility] for wound care. May utilize formulary equivalent dressing for wound treatment orders unless otherwise specified. Home Health Nurse may visit PRN to address patient s wound care needs. Scheduled days for dressing changes to be completed; exception, patient has scheduled wound care visit that day. - HH to wrap twice weekly and at wound center one time unless he is unable to have appt at wound center that week **Please direct any NON-WOUND related issues/requests for orders to patient's Primary Care Physician. **If current dressing causes regression in wound condition, may D/C ordered dressing product/s and apply Normal Saline Moist Dressing daily until next [place] or  Other MD appointment. **Notify Wound Healing Center of regression in wound condition at [phone]. Bathing/ Shower/ Hygiene: May shower with wound dressing protected with water repellent cover or cast protector. No tub bath. Anesthetic (Use 'Patient Medications' Section for Anesthetic Order Entry): Lidocaine applied to wound bed Edema Control - Lymphedema / Segmental Compressive Device / Other: Elevate legs to the level of the heart and pump ankles as often as possible Elevate leg(s) parallel to the floor when sitting. DO YOUR BEST to sleep in the bed at night. DO NOT sleep in your recliner. Long hours of sitting in Wood recliner leads to swelling of the legs and/or potential wounds on your backside. Non-Wound Condition: Additional non-wound orders/instructions: - Triamcinolone mixed with lotion to bilateral lower legs Off-Loading: Open toe surgical shoe with peg assist. - L heel wedge shoe Turn and reposition every 2 hours Bissette, Cameryn Wood. ([id number]) Additional Orders / Instructions: Follow Nutritious Diet and Increase Protein Intake - monitor  blood sugar The following medication(s) was prescribed: triamcinolone acetonide topical 0.1 % ointment ointment topical applied to the legs mixed 1:1 with eucerin by the home health nurse to be applied covering the legs before wraps 3x weekly starting 05/26/2021 WOUND #10: - Lower Leg Wound Laterality: Right, Posterior Cleanser: Normal Saline 3 x Per Week/15 Days Discharge Instructions: Wash your hands with soap and water. Remove old dressing, discard into plastic bag and place into trash. Cleanse the wound with Normal Saline prior to applying Wood clean dressing using gauze sponges, not tissues or cotton balls. Do not scrub or use excessive force. Pat dry using gauze sponges, not tissue or cotton balls. Cleanser: Soap and Water 3 x Per Week/15 Days Discharge Instructions: Gently cleanse wound with antibacterial soap, rinse and pat dry prior to dressing wounds Cleanser: Wound Cleanser 3 x Per Week/15 Days Discharge Instructions: Wash your hands with soap and water. Remove old dressing, discard into plastic bag and place into trash. Cleanse the wound with Wound Cleanser prior to applying Wood clean dressing using gauze sponges, not tissues or cotton balls. Do not scrub or use excessive force. Pat dry using gauze sponges, not tissue or cotton balls. Topical: Triamcinolone Acetonide Cream, 0.1%, 15 (g) tube 3 x Per Week/15 Days Discharge Instructions: Mixed with half Eucerin or alternative-to bilateral lower legsApply as directed by provider. Topical: Eucerin lotion 3 x Per Week/15 Days Discharge Instructions: Mixed 1/2 with TCA to bilateral lower legs Primary Dressing: Iodosorb 40 (g) 3 x Per Week/15 Days Discharge Instructions: Apply IodoSorb to wound bed only as directed. Secondary Dressing: Zetuvit Plus Silicone Non-bordered 5x5 (in/in) 3 x Per Week/15 Days Compression Wrap: Profore Lite LF 3 Multilayer Compression Bandaging System 3 x Per Week/15 Days Discharge Instructions: Apply 3 multi-layer  wrap as prescribed. WOUND #7: - Calcaneus Wound Laterality: Left Cleanser: Normal Saline 3 x Per Week/15 Days Discharge Instructions: Wash your hands with soap and water. Remove old dressing, discard into plastic bag and place into trash. Cleanse the wound with Normal Saline prior to applying Wood clean dressing using gauze sponges, not tissues or cotton balls. Do not scrub or use excessive force. Pat dry using gauze sponges, not tissue or cotton balls. Cleanser: Soap and Water 3 x Per Week/15 Days Discharge Instructions: Gently cleanse wound with antibacterial soap, rinse and pat dry prior to dressing wounds Cleanser: Wound Cleanser 3 x Per Week/15 Days Discharge Instructions: Wash your hands with soap and water. Remove old dressing, discard into plastic bag and place into trash. Cleanse the  wound with Wound Cleanser prior to applying Wood clean dressing using gauze sponges, not tissues or cotton balls. Do not scrub or use excessive force. Pat dry using gauze sponges, not tissue or cotton balls. Topical: Triamcinolone Acetonide Cream, 0.1%, 15 (g) tube 3 x Per Week/15 Days Discharge Instructions: Mixed with half Eucerin or alternative-to bilateral lower legsApply as directed by provider. Topical: Eucerin lotion 3 x Per Week/15 Days Discharge Instructions: Mixed 1/2 with TCA to bilateral lower legs Primary Dressing: Iodosorb 40 (g) 3 x Per Week/15 Days Discharge Instructions: Apply IodoSorb to wound bed only as directed. Secondary Dressing: Zetuvit Plus Silicone Non-bordered 5x5 (in/in) 3 x Per Week/15 Days Compression Wrap: Profore Lite LF 3 Multilayer Compression Bandaging System 3 x Per Week/15 Days Discharge Instructions: Apply 3 multi-layer wrap as prescribed. WOUND #9: - Toe Great Wound Laterality: Right Cleanser: Normal Saline 3 x Per Week/15 Days Discharge Instructions: Wash your hands with soap and water. Remove old dressing, discard into plastic bag and place into trash. Cleanse the  wound with Normal Saline prior to applying Wood clean dressing using gauze sponges, not tissues or cotton balls. Do not scrub or use excessive force. Pat dry using gauze sponges, not tissue or cotton balls. Cleanser: Soap and Water 3 x Per Week/15 Days Discharge Instructions: Gently cleanse wound with antibacterial soap, rinse and pat dry prior to dressing wounds Cleanser: Wound Cleanser 3 x Per Week/15 Days Discharge Instructions: Wash your hands with soap and water. Remove old dressing, discard into plastic bag and place into trash. Cleanse the wound with Wound Cleanser prior to applying Wood clean dressing using gauze sponges, not tissues or cotton balls. Do not scrub or use excessive force. Pat dry using gauze sponges, not tissue or cotton balls. Topical: Triamcinolone Acetonide Cream, 0.1%, 15 (g) tube 3 x Per Week/15 Days Discharge Instructions: Mixed with half Eucerin or alternative-to bilateral lower legsApply as directed by provider. Topical: Eucerin lotion 3 x Per Week/15 Days Discharge Instructions: Mixed 1/2 with TCA to bilateral lower legs Primary Dressing: Iodosorb 40 (g) 3 x Per Week/15 Days Discharge Instructions: Apply IodoSorb to wound bed only as directed. Secondary Dressing: Zetuvit Plus Silicone Non-bordered 5x5 (in/in) 3 x Per Week/15 Days Compression Wrap: Profore Lite LF 3 Multilayer Compression Bandaging System 3 x Per Week/15 Days Discharge Instructions: Apply 3 multi-layer wrap as prescribed. 1. Based on what I am seeing currently I am going to go ahead and initiate treatment with Wood mixture of lotion and triamcinolone to the legs in general to help with some of the irritation in regard to the legs and the significantly dry skin. The patient is in agreement with that plan. 2. I am also can recommend that we initiate Iodosorb/Iodoflex for any open wound locations on the lower extremities bilaterally. I think that this will be Wood good option to help treat the regions in general  and to be honest to keep everything consistent which I think will also be good. 3. I am also going to suggest that we go ahead and initiate Wood 3 layer compression wrap I do not want to go to Wood 4-layer I think that might be too strong considering his congestive heart failure I do not want to overdo anything here he is in agreement with that plan. We will see patient back for reevaluation in 2 weeks here in the clinic. If anything worsens or changes patient will contact our office for additional recommendations. Polgar, SHIRL LUDINGTON ([id number]) Electronic Signature(s) Signed: 05/26/2021 1:02:10 PM  By: Worthy Keeler PA-C Entered By: Worthy Keeler on 05/26/2021 13:02:10 Lembo, Nanine Means ([id number]) -------------------------------------------------------------------------------- ROS/PFSH Details Patient Name: [name], [name]. Date of Service: 05/26/2021 10:00 AM Medical Record Number: [id number] Patient Account Number: [id number] Date of Birth/Sex: Aug 07, 1955 (66 y.o. M) Treating RN: Donnamarie Poag Primary Care Provider: Fulton Reek Other Clinician: Referring Provider: Caroline More Treating Provider/Extender: Skipper Cliche in Treatment: 0 Information Obtained From Patient Constitutional Symptoms (General Health) Complaints and Symptoms: Negative for: Fatigue; Fever; Chills; Marked Weight Change Eyes Complaints and Symptoms: Positive for: Vision Changes Ear/Nose/Mouth/Throat Complaints and Symptoms: Negative for: Difficult clearing ears; Sinusitis Respiratory Complaints and Symptoms: Positive for: Shortness of Breath Medical History: Positive for: Asthma; Chronic Obstructive Pulmonary Disease (COPD); Sleep Apnea Cardiovascular Complaints and Symptoms: Positive for: LE edema Medical History: Positive for: Arrhythmia; Congestive Heart Failure; Coronary Artery Disease; Hypertension; Myocardial Infarction; Peripheral Venous Disease Genitourinary Complaints and  Symptoms: Negative for: Kidney failure/ Dialysis; Incontinence/dribbling Immunological Complaints and Symptoms: Negative for: Hives; Itching Integumentary (Skin) Complaints and Symptoms: Positive for: Wounds; Breakdown; Swelling Negative for: Bleeding or bruising tendency Medical History: Positive for: History of pressure wounds Neurologic Complaints and Symptoms: Negative for: Numbness/parasthesias; Focal/Weakness Psychiatric [place], Alec VASTINE. ([id number]) Complaints and Symptoms: Negative for: Anxiety; Claustrophobia Hematologic/Lymphatic Medical History: Positive for: Lymphedema Gastrointestinal Medical History: Positive for: Colitis - IBS Endocrine Medical History: Positive for: Type II Diabetes Time with diabetes: 2000 Treated with: Insulin, Oral agents Blood sugar tested every day: Yes Tested : daily Blood sugar testing results: Breakfast: 153 Musculoskeletal Medical History: Positive for: Gout; Osteoarthritis Oncologic Immunizations Pneumococcal Vaccine: Received Pneumococcal Vaccination: No Implantable Devices None Family and Social History Former smoker - ended on 05/01/2015; Marital Status - Married; Alcohol Use: Rarely; Drug Use: Prior History; Caffeine Use: Daily; Financial Concerns: No; Food, Clothing or Shelter Needs: No; Support System Lacking: No; Transportation Concerns: No Electronic Signature(s) Signed: 05/26/2021 4:05:14 PM By: Donnamarie Poag Signed: 05/26/2021 4:30:05 PM By: Worthy Keeler PA-C Entered By: Donnamarie Poag on 05/26/2021 10:59:08 Kilpatrick, Nanine Means ([id number]) -------------------------------------------------------------------------------- SuperBill Details Patient Name: [name], [name]. Date of Service: 05/26/2021 Medical Record Number: [id number] Patient Account Number: [id number] Date of Birth/Sex: 1955/10/01 (66 y.o. M) Treating RN: Donnamarie Poag Primary Care Provider: Fulton Reek Other Clinician: Referring Provider: Caroline More Treating Provider/Extender: Skipper Cliche in Treatment: 0 Diagnosis Coding ICD-10 Codes Code Description [phone] Pressure ulcer of left heel, stage 3 E11.621 Type 2 diabetes mellitus with foot ulcer L97.522 Non-pressure chronic ulcer of other part of left foot with fat layer exposed L89.893 Pressure ulcer of other site, stage 3 I87.2 Venous insufficiency (chronic) (peripheral) I50.42 Chronic combined systolic (congestive) and diastolic (congestive) heart failure I10 Essential (primary) hypertension Facility Procedures CPT4 Code: 23536144 Description: 99213 - WOUND CARE VISIT-LEV 3 EST PT Modifier: Quantity: 1 CPT4 Code: 31540086 Description: 11042 - DEB SUBQ TISSUE 20 SQ CM/< Modifier: Quantity: 1 CPT4 Code: Description: ICD-10 Diagnosis Description L89.623 Pressure ulcer of left heel, stage 3 Modifier: Quantity: Physician Procedures CPT4 Code: 7619509 Description: 99214 - WC PHYS LEVEL 4 - EST PT Modifier: 25 Quantity: 1 CPT4 Code: Description: ICD-10 Diagnosis Description L89.623 Pressure ulcer of left heel, stage 3 E11.621 Type 2 diabetes mellitus with foot ulcer L97.522 Non-pressure chronic ulcer of other part of left foot with fat layer exp L89.893 Pressure ulcer of other site,  stage 3 Modifier: osed Quantity: CPT4 Code: 3267124 Description: 11042 - WC PHYS SUBQ TISS 20 SQ CM Modifier: Quantity: 1 CPT4 Code: Description: ICD-10 Diagnosis Description [phone]  Pressure ulcer of left heel, stage 3 Modifier: Quantity: Electronic Signature(s) Signed: 05/26/2021 1:02:46 PM By: Worthy Keeler PA-C Entered By: Worthy Keeler on 05/26/2021 13:02:46

## 2021-05-26 NOTE — Progress Notes (Signed)
MCADOO, MUZQUIZ ([id number]) Visit Report for 05/26/2021 Abuse Risk Screen Details Patient Name: [name], [name] Date of Service: 05/26/2021 10:00 AM Medical Record Number: [id number] Patient Account Number: [id number] Date of Birth/Sex: July 22, 1955 (66 y.o. M) Treating RN: Donnamarie Poag Primary Care Edric Fetterman: Fulton Reek Other Clinician: Referring Lavontae Cornia: Caroline More Treating Geovanni Rahming/Extender: Skipper Cliche in Treatment: 0 Abuse Risk Screen Items Answer ABUSE RISK SCREEN: Has anyone close to you tried to hurt or harm you recentlyo No Do you feel uncomfortable with anyone in your familyo No Has anyone forced you do things that you didnot want to doo No Electronic Signature(s) Signed: 05/26/2021 4:05:14 PM By: Donnamarie Poag Entered By: Donnamarie Poag on 05/26/2021 10:59:17 Martindelcampo, Nanine Means ([id number]) -------------------------------------------------------------------------------- Activities of Daily Living Details Patient Name: [name], [name] Date of Service: 05/26/2021 10:00 AM Medical Record Number: [id number] Patient Account Number: [id number] Date of Birth/Sex: Sep 26, 1955 (66 y.o. M) Treating RN: Donnamarie Poag Primary Care Araly Kaas: Fulton Reek Other Clinician: Referring Placido Hangartner: Caroline More Treating Jace Fermin/Extender: Skipper Cliche in Treatment: 0 Activities of Daily Living Items Answer Activities of Daily Living (Please select one for each item) Drive Automobile Need Assistance Take Medications Completely Able Use Telephone Completely Able Care for Appearance Need Assistance Use Toilet Need Assistance Bath / Shower Need Assistance Dress Self Need Assistance Feed Self Completely Able Walk Completely Able Get In / Out Bed Completely Able Housework Not Able Prepare Meals Need Assistance Handle Money Completely Able Shop for Self Need Assistance Electronic Signature(s) Signed: 05/26/2021 4:05:14 PM By: Donnamarie Poag Entered By: Donnamarie Poag on  05/26/2021 10:59:45 Banet, Nanine Means ([id number]) -------------------------------------------------------------------------------- Education Screening Details Patient Name: [name], [name] Date of Service: 05/26/2021 10:00 AM Medical Record Number: [id number] Patient Account Number: [id number] Date of Birth/Sex: August 19, 1955 (66 y.o. M) Treating RN: Donnamarie Poag Primary Care Tylee Newby: Fulton Reek Other Clinician: Referring Colby Reels: Caroline More Treating Adonis Yim/Extender: Skipper Cliche in Treatment: 0 Primary Learner Assessed: Patient Learning Preferences/Education Level/Primary Language Highest Education Level: College or Above Preferred Language: English Cognitive Barrier Language Barrier: No Translator Needed: No Memory Deficit: No Emotional Barrier: No Cultural/Religious Beliefs Affecting Medical Care: No Physical Barrier Impaired Vision: No Impaired Hearing: No Decreased Hand dexterity: No Knowledge/Comprehension Knowledge Level: High Comprehension Level: High Ability to understand written instructions: High Ability to understand verbal instructions: High Motivation Anxiety Level: Calm Cooperation: Cooperative Education Importance: Acknowledges Need Interest in Health Problems: Asks Questions Perception: Coherent Willingness to Engage in Self-Management High Activities: Readiness to Engage in Self-Management High Activities: Electronic Signature(s) Signed: 05/26/2021 4:05:14 PM By: Donnamarie Poag Entered By: Donnamarie Poag on 05/26/2021 11:00:05 Mink, Nanine Means ([id number]) -------------------------------------------------------------------------------- Fall Risk Assessment Details Patient Name: [name], [name] Date of Service: 05/26/2021 10:00 AM Medical Record Number: [id number] Patient Account Number: [id number] Date of Birth/Sex: 06/24/1955 (66 y.o. M) Treating RN: Donnamarie Poag Primary Care Cantrell Martus: Fulton Reek Other Clinician: Referring  Onica Davidovich: Caroline More Treating Jahzier Villalon/Extender: Skipper Cliche in Treatment: 0 Fall Risk Assessment Items Have you had 2 or more falls in the last 12 monthso 0 No Have you had any fall that resulted in injury in the last 12 monthso 0 No FALLS RISK SCREEN History of falling - immediate or within 3 months 0 No Secondary diagnosis (Do you have 2 or more medical diagnoseso) 15 Yes Ambulatory aid None/bed rest/wheelchair/nurse 0 No Crutches/cane/walker 15 Yes Furniture 0 No Intravenous therapy Access/Saline/Heparin Lock 0 No Gait/Transferring Normal/ bed rest/ wheelchair 0 No Weak (short steps with or without shuffle, stooped but able  to lift head while walking, may 10 Yes seek support from furniture) Impaired (short steps with shuffle, may have difficulty arising from chair, head down, impaired 0 No balance) Mental Status Oriented to own ability 0 Yes Electronic Signature(s) Signed: 05/26/2021 4:05:14 PM By: Donnamarie Poag Entered By: Donnamarie Poag on 05/26/2021 11:00:30 [place], Nanine Means ([id number]) -------------------------------------------------------------------------------- Foot Assessment Details Patient Name: [name], [name] Date of Service: 05/26/2021 10:00 AM Medical Record Number: [id number] Patient Account Number: [id number] Date of Birth/Sex: 1955-07-24 (66 y.o. M) Treating RN: Donnamarie Poag Primary Care Roberta Angell: Fulton Reek Other Clinician: Referring Soraiya Ahner: Caroline More Treating Markas Aldredge/Extender: Skipper Cliche in Treatment: 0 Foot Assessment Items Site Locations + = Sensation present, - = Sensation absent, C = Callus, U = Ulcer R = Redness, W = Warmth, M = Maceration, PU = Pre-ulcerative lesion F = Fissure, S = Swelling, D = Dryness Assessment Right: Left: Other Deformity: No No Prior Foot Ulcer: Yes Yes Prior Amputation: Yes Yes Charcot Joint: No No Ambulatory Status: Ambulatory With Help Assistance Device: Walker Gait:  Steady Electronic Signature(s) Signed: 05/26/2021 4:05:14 PM By: Donnamarie Poag Entered By: Donnamarie Poag on 05/26/2021 11:03:03 Arns, Nanine Means ([id number]) -------------------------------------------------------------------------------- Nutrition Risk Screening Details Patient Name: [name], [name] Date of Service: 05/26/2021 10:00 AM Medical Record Number: [id number] Patient Account Number: [id number] Date of Birth/Sex: August 11, 1955 (67 y.o. M) Treating RN: Donnamarie Poag Primary Care Deddrick Saindon: Fulton Reek Other Clinician: Referring Lilu Mcglown: Caroline More Treating Quinnie Barcelo/Extender: Skipper Cliche in Treatment: 0 Height (in): 73 Weight (lbs): 371 Body Mass Index (BMI): 48.9 Nutrition Risk Screening Items Score Screening NUTRITION RISK SCREEN: I have an illness or condition that made me change the kind and/or amount of food I eat 0 No I eat fewer than two meals per day 0 No I eat few fruits and vegetables, or milk products 0 No I have three or more drinks of beer, liquor or wine almost every day 0 No I have tooth or mouth problems that make it hard for me to eat 0 No I don't always have enough money to buy the food I need 0 No I eat alone most of the time 0 No I take three or more different prescribed or over-the-counter drugs a day 1 Yes Without wanting to, I have lost or gained 10 pounds in the last six months 0 No I am not always physically able to shop, cook and/or feed myself 0 No Nutrition Protocols Good Risk Protocol 0 No interventions needed Moderate Risk Protocol 0 Provide education on nutrition High Risk Proctocol Risk Level: Good Risk Score: 1 Electronic Signature(s) Signed: 05/26/2021 4:05:14 PM By: Donnamarie Poag Entered ByDonnamarie Poag on 05/26/2021 11:01:14

## 2021-05-31 DIAGNOSIS — E875 Hyperkalemia: Secondary | ICD-10-CM | POA: Diagnosis not present

## 2021-05-31 DIAGNOSIS — I10 Essential (primary) hypertension: Secondary | ICD-10-CM | POA: Diagnosis not present

## 2021-05-31 DIAGNOSIS — I739 Peripheral vascular disease, unspecified: Secondary | ICD-10-CM | POA: Diagnosis not present

## 2021-05-31 DIAGNOSIS — I5031 Acute diastolic (congestive) heart failure: Secondary | ICD-10-CM | POA: Diagnosis not present

## 2021-05-31 DIAGNOSIS — I25710 Atherosclerosis of autologous vein coronary artery bypass graft(s) with unstable angina pectoris: Secondary | ICD-10-CM | POA: Diagnosis not present

## 2021-06-02 ENCOUNTER — Encounter: Payer: Medicare Other | Attending: Physician Assistant | Admitting: Physician Assistant

## 2021-06-02 ENCOUNTER — Other Ambulatory Visit: Payer: Self-pay

## 2021-06-02 DIAGNOSIS — L97818 Non-pressure chronic ulcer of other part of right lower leg with other specified severity: Secondary | ICD-10-CM | POA: Diagnosis not present

## 2021-06-02 DIAGNOSIS — E875 Hyperkalemia: Secondary | ICD-10-CM | POA: Diagnosis not present

## 2021-06-02 DIAGNOSIS — I872 Venous insufficiency (chronic) (peripheral): Secondary | ICD-10-CM | POA: Insufficient documentation

## 2021-06-02 DIAGNOSIS — Z794 Long term (current) use of insulin: Secondary | ICD-10-CM | POA: Insufficient documentation

## 2021-06-02 DIAGNOSIS — I890 Lymphedema, not elsewhere classified: Secondary | ICD-10-CM | POA: Diagnosis not present

## 2021-06-02 DIAGNOSIS — E11621 Type 2 diabetes mellitus with foot ulcer: Secondary | ICD-10-CM | POA: Insufficient documentation

## 2021-06-02 DIAGNOSIS — L97522 Non-pressure chronic ulcer of other part of left foot with fat layer exposed: Secondary | ICD-10-CM | POA: Diagnosis not present

## 2021-06-02 DIAGNOSIS — L89893 Pressure ulcer of other site, stage 3: Secondary | ICD-10-CM | POA: Diagnosis not present

## 2021-06-02 DIAGNOSIS — I110 Hypertensive heart disease with heart failure: Secondary | ICD-10-CM | POA: Diagnosis not present

## 2021-06-02 DIAGNOSIS — I5042 Chronic combined systolic (congestive) and diastolic (congestive) heart failure: Secondary | ICD-10-CM | POA: Diagnosis not present

## 2021-06-02 DIAGNOSIS — M1990 Unspecified osteoarthritis, unspecified site: Secondary | ICD-10-CM | POA: Insufficient documentation

## 2021-06-02 DIAGNOSIS — L89623 Pressure ulcer of left heel, stage 3: Secondary | ICD-10-CM | POA: Diagnosis not present

## 2021-06-02 NOTE — Progress Notes (Addendum)
NAIM, MURTHA ([id number]) Visit Report for 06/02/2021 Arrival Information Details Patient Name: WHITAKER, HOLDERMAN A. Date of Service: 06/02/2021 10:45 AM Medical Record Number: [id number] Patient Account Number: [id number] Date of Birth/Sex: [birth year] (66 y.o. M) Treating RN: Donnamarie Poag Primary Care : Fulton Reek Other Clinician: Referring : Fulton Reek Treating /Extender: Skipper Cliche in Treatment: 1 Visit Information History Since Last Visit Added or deleted any medications: No Patient Arrived: [name] Had a fall or experienced change in No Arrival Time: 10:46 activities of daily living that may affect Accompanied By: gdaughter risk of falls: Transfer Assistance: None Hospitalized since last visit: No Patient Identification Verified: Yes Has Dressing in Place as Prescribed: Yes Secondary Verification Process Completed: Yes Pain Present Now: No Patient Requires Transmission-Based No Precautions: Patient Has Alerts: Yes Patient Alerts: Patient on Blood Thinner DIABETIC Eliquis/Aspirin 81 AVVS ABI 06/2020 L 1.0; R 0.88 Electronic Signature(s) Signed: 06/02/2021 12:52:45 PM By: Donnamarie Poag Entered By: Donnamarie Poag on 06/02/2021 10:52:06 Leisure, Nanine Means ([id number]) -------------------------------------------------------------------------------- Compression Therapy Details Patient Name: Roper, Mead A. Date of Service: 06/02/2021 10:45 AM Medical Record Number: [id number] Patient Account Number: [id number] Date of Birth/Sex: 1956-04-09 (66 y.o. M) Treating RN: Donnamarie Poag Primary Care : Fulton Reek Other Clinician: Referring : Fulton Reek Treating /Extender: Skipper Cliche in Treatment: 1 Compression Therapy Performed for Wound Assessment: Wound #10 Right,Posterior Lower Leg Performed By: Clinician Donnamarie Poag, RN Compression Type: Three Layer Post Procedure Diagnosis Same as  Pre-procedure Electronic Signature(s) Signed: 06/02/2021 12:52:45 PM By: Donnamarie Poag Entered By: Donnamarie Poag on 06/02/2021 11:12:14 Struss, Nanine Means ([id number]) -------------------------------------------------------------------------------- Compression Therapy Details Patient Name: Craker, Riyaan A. Date of Service: 06/02/2021 10:45 AM Medical Record Number: [id number] Patient Account Number: [id number] Date of Birth/Sex: 1955-06-12 (66 y.o. M) Treating RN: Donnamarie Poag Primary Care : Fulton Reek Other Clinician: Referring : Fulton Reek Treating /Extender: Skipper Cliche in Treatment: 1 Compression Therapy Performed for Wound Assessment: Wound #7 Left Calcaneus Performed By: Clinician Donnamarie Poag, RN Compression Type: Three Layer Post Procedure Diagnosis Same as Pre-procedure Electronic Signature(s) Signed: 06/02/2021 12:52:45 PM By: Donnamarie Poag Entered By: Donnamarie Poag on 06/02/2021 11:12:32 Lasorsa, Nanine Means ([id number]) -------------------------------------------------------------------------------- Encounter Discharge Information Details Patient Name: [name] Date of Service: 06/02/2021 10:45 AM Medical Record Number: [id number] Patient Account Number: [id number] Date of Birth/Sex: 06/25/1955 (66 y.o. M) Treating RN: Donnamarie Poag Primary Care : Fulton Reek Other Clinician: Referring : Fulton Reek Treating /Extender: Skipper Cliche in Treatment: 1 Encounter Discharge Information Items Post Procedure Vitals Discharge Condition: Stable Temperature (F): 97.8 Ambulatory Status: Walker Pulse (bpm): 106 Discharge Destination: Home Respiratory Rate (breaths/min): 16 Transportation: Private Auto Blood Pressure (mmHg): 149/88 Accompanied By: gdaughter Schedule Follow-up Appointment: Yes Clinical Summary of Care: Electronic Signature(s) Signed: 06/02/2021 12:52:45 PM By: Donnamarie Poag Entered By:  Donnamarie Poag on 06/02/2021 11:48:30 Appleman, Nanine Means ([id number]) -------------------------------------------------------------------------------- Lower Extremity Assessment Details Patient Name: Raffo, Lexie A. Date of Service: 06/02/2021 10:45 AM Medical Record Number: [id number] Patient Account Number: [id number] Date of Birth/Sex: 10/08/1955 (66 y.o. M) Treating RN: Donnamarie Poag Primary Care : Fulton Reek Other Clinician: Referring : Fulton Reek Treating /Extender: Skipper Cliche in Treatment: 1 Edema Assessment Assessed: [Left: Yes] [Right: Yes] Edema: [Left: Yes] [Right: Yes] Calf Left: Right: Point of Measurement: 36 cm From Medial Instep 54 cm 54.5 cm Ankle Left: Right: Point of Measurement: 12 cm From Medial Instep 30.5 cm 29 cm Knee To Floor Left: Right: From Medial Instep 47 cm 474 cm Vascular  Assessment Pulses: Dorsalis Pedis Palpable: [Left:Yes] [Right:Yes] Electronic Signature(s) Signed: 06/02/2021 12:52:45 PM By: Donnamarie Poag Entered By: Donnamarie Poag on 06/02/2021 11:10:03 Tison, Nanine Means ([id number]) -------------------------------------------------------------------------------- Multi Wound Chart Details Patient Name: Douds, Rykker A. Date of Service: 06/02/2021 10:45 AM Medical Record Number: [id number] Patient Account Number: [id number] Date of Birth/Sex: [birth year] (66 y.o. M) Treating RN: Donnamarie Poag Primary Care Lala Been: Fulton Reek Other Clinician: Referring Jendaya Gossett: Fulton Reek Treating Meriel Kelliher/Extender: Skipper Cliche in Treatment: 1 Vital Signs Height(in): 33 Pulse(bpm): 106 Weight(lbs): 557 Blood Pressure(mmHg): 149/88 Body Mass Index(BMI): 48.9 Temperature(F): 97.8 Respiratory Rate(breaths/min): 18 Photos: Wound Location: Right, Posterior Lower Leg Left Calcaneus Right Toe Great Wounding Event: Gradually Appeared Gradually Appeared Gradually Appeared Primary Etiology: Venous Leg Ulcer  Pressure Ulcer Pressure Ulcer Secondary Etiology: [name] [name] Diabetic Wound/Ulcer of the Lower Extremity Comorbid History: Lymphedema, Asthma, Chronic Lymphedema, Asthma, Chronic Lymphedema, Asthma, Chronic Obstructive Pulmonary Disease Obstructive Pulmonary Disease Obstructive Pulmonary Disease (COPD), Sleep Apnea, Arrhythmia, (COPD), Sleep Apnea, Arrhythmia, (COPD), Sleep Apnea, Arrhythmia, Congestive Heart Failure, Coronary Congestive Heart Failure, Coronary Congestive Heart Failure, Coronary Artery Disease, Hypertension, Artery Disease, Hypertension, Artery Disease, Hypertension, Myocardial Infarction, Peripheral Myocardial Infarction, Peripheral Myocardial Infarction, Peripheral Venous Disease, Colitis, Type II Venous Disease, Colitis, Type II Venous Disease, Colitis, Type II Diabetes, History of pressure Diabetes, History of pressure Diabetes, History of pressure wounds, Gout, Osteoarthritis wounds, Gout, Osteoarthritis wounds, Gout, Osteoarthritis Date Acquired: 05/26/2021 05/05/2021 04/26/2021 Weeks of Treatment: _0 Wound Status: Open Open Open Wound Recurrence: No No No Pending Amputation on No Yes Yes Presentation: Measurements L x W x D (cm) 0.5x0.5x0.1 2.5x3.3x1 1x1x0.1 Area (cm) : 0.196 6.48 0.785 Volume (cm) : 0.02 6.48 0.079 % Reduction in Area: 68.80% -13.60% -25.00% % Reduction in Volume: 68.30% -13.60% -25.40% Classification: Full Thickness Without Exposed Category/Stage III Category/Stage III Support Structures Exudate Amount: Medium Large Medium Exudate Type: Serosanguineous Serosanguineous Serosanguineous Exudate Color: red, brown red, brown red, brown Granulation Amount: Large (67-100%) Small (1-33%) Large (67-100%) Granulation Quality: Red Pink Red, Pink Necrotic Amount: Small (1-33%) Large (67-100%) Small (1-33%) Necrotic Tissue: Adherent Erwin Exposed Structures: Fat Layer (Subcutaneous Tissue): Fat Layer (Subcutaneous  Tissue): Fat Layer (Subcutaneous Tissue): Yes Yes Yes Fascia: No Fascia: No Fascia: No Tendon: No Tendon: No Tendon: No Muscle: No Muscle: No Muscle: No Joint: No Joint: No Joint: No Bone: No Bone: No Bone: No Standing, SEANMICHAEL SALMONS ([id number]) Treatment Notes Electronic Signature(s) Signed: 06/02/2021 12:52:45 PM By: Donnamarie Poag Entered By: Donnamarie Poag on 06/02/2021 11:10:43 Covault, Nanine Means ([id number]) -------------------------------------------------------------------------------- Multi-Disciplinary Care Plan Details Patient Name: [name] Date of Service: 06/02/2021 10:45 AM Medical Record Number: [id number] Patient Account Number: [id number] Date of Birth/Sex: 1955-07-30 (66 y.o. M) Treating RN: Donnamarie Poag Primary Care Masayo Fera: Fulton Reek Other Clinician: Referring Natthew Marlatt: Fulton Reek Treating Ambyr Qadri/Extender: Skipper Cliche in Treatment: 1 Active Inactive Wound/Skin Impairment Nursing Diagnoses: Impaired tissue integrity Knowledge deficit related to smoking impact on wound healing Knowledge deficit related to ulceration/compromised skin integrity Goals: Patient will have a decrease in wound volume by X% from date: (specify in notes) Date Initiated: 05/26/2021 Target Resolution Date: 06/23/2021 Goal Status: Active Patient/caregiver will verbalize understanding of skin care regimen Date Initiated: 05/26/2021 Date Inactivated: 06/02/2021 Target Resolution Date: 06/09/2021 Goal Status: Met Interventions: Assess patient/caregiver ability to obtain necessary supplies Assess patient/caregiver ability to perform ulcer/skin care regimen upon admission and as needed Notes: Electronic Signature(s) Signed: 06/02/2021 12:52:45 PM By: Donnamarie Poag Entered ByDonnamarie Poag on 06/02/2021  11:10:25 Rayos, Niyam A. ([id number]) -------------------------------------------------------------------------------- Non-Wound Condition Assessment Details Patient  Name: Royer, Brysun A. Date of Service: 06/02/2021 10:45 AM Medical Record Number: [id number] Patient Account Number: [id number] Date of Birth/Sex: [birth year] (66 y.o. M) Treating RN: Donnamarie Poag Primary Care Brentt Fread: Fulton Reek Other Clinician: Referring Anjoli Diemer: Fulton Reek Treating Jeriann Sayres/Extender: Jeri Cos Weeks in Treatment: 1 Non-Wound Condition: Condition: Lymphedema Location: Leg Side: Left Photos Electronic Signature(s) Signed: 06/02/2021 12:52:45 PM By: Donnamarie Poag Entered By: Donnamarie Poag on 06/02/2021 11:11:14 Kuzel, Fionn Loni Muse ([id number]) -------------------------------------------------------------------------------- Non-Wound Condition Assessment Details Patient Name: Seabolt, Deonte A. Date of Service: 06/02/2021 10:45 AM Medical Record Number: [id number] Patient Account Number: [id number] Date of Birth/Sex: [birth year] (66 y.o. M) Treating RN: Donnamarie Poag Primary Care Joia Doyle: Fulton Reek Other Clinician: Referring Braydan Marriott: Fulton Reek Treating Glenda Kunst/Extender: Jeri Cos Weeks in Treatment: 1 Non-Wound Condition: Condition: Lymphedema Location: Leg Side: Right Photos Electronic Signature(s) Signed: 06/02/2021 12:52:45 PM By: Donnamarie Poag Entered By: Donnamarie Poag on 06/02/2021 11:11:38 Stamour, Nanine Means ([id number]) -------------------------------------------------------------------------------- Pain Assessment Details Patient Name: Counts, Jourdin A. Date of Service: 06/02/2021 10:45 AM Medical Record Number: [id number] Patient Account Number: [id number] Date of Birth/Sex: 1955-08-06 (66 y.o. M) Treating RN: Donnamarie Poag Primary Care Zohar Maroney: Fulton Reek Other Clinician: Referring Damein Gaunce: Fulton Reek Treating Ajahni Nay/Extender: Skipper Cliche in Treatment: 1 Active Problems Location of Pain Severity and Description of Pain Patient Has Paino No Site Locations Rate the pain. Current Pain Level: 0 Pain Management  and Medication Current Pain Management: Electronic Signature(s) Signed: 06/02/2021 12:52:45 PM By: Donnamarie Poag Entered By: Donnamarie Poag on 06/02/2021 10:52:49 Dulak, Nanine Means ([id number]) -------------------------------------------------------------------------------- Patient/Caregiver Education Details Patient Name: [name] Date of Service: 06/02/2021 10:45 AM Medical Record Number: [id number] Patient Account Number: [id number] Date of Birth/Gender: Sep 13, 1955 (66 y.o. M) Treating RN: Donnamarie Poag Primary Care Physician: Fulton Reek Other Clinician: Referring Physician: Fulton Reek Treating Physician/Extender: Skipper Cliche in Treatment: 1 Education Assessment Education Provided To: Patient Education Topics Provided Basic Hygiene: Venous: Wound/Skin Impairment: Electronic Signature(s) Signed: 06/02/2021 12:52:45 PM By: Donnamarie Poag Entered By: Donnamarie Poag on 06/02/2021 11:19:34 Boyett, Nanine Means ([id number]) -------------------------------------------------------------------------------- Wound Assessment Details Patient Name: Kier, Noris A. Date of Service: 06/02/2021 10:45 AM Medical Record Number: [id number] Patient Account Number: [id number] Date of Birth/Sex: [birth year] (66 y.o. M) Treating RN: Donnamarie Poag Primary Care Ayanni Tun: Fulton Reek Other Clinician: Referring Oliviana Mcgahee: Fulton Reek Treating Iana Buzan/Extender: Skipper Cliche in Treatment: 1 Wound Status Wound Number: 10 Primary Venous Leg Ulcer Etiology: Wound Location: Right, Posterior Lower Leg Wound Open Wounding Event: Gradually Appeared Status: Date Acquired: 05/26/2021 Comorbid Lymphedema, Asthma, Chronic Obstructive Pulmonary Weeks Of Treatment: 1 History: Disease (COPD), Sleep Apnea, Arrhythmia, Congestive Clustered Wound: No Heart Failure, Coronary Artery Disease, Hypertension, Myocardial Infarction, Peripheral Venous Disease, Colitis, Type II Diabetes, History of  pressure wounds, Gout, Osteoarthritis Photos Wound Measurements Length: (cm) 0.5 Width: (cm) 0.5 Depth: (cm) 0.1 Area: (cm) 0.196 Volume: (cm) 0.02 % Reduction in Area: 68.8% % Reduction in Volume: 68.3% Wound Description Classification: Full Thickness Without Exposed Support Structures Exudate Amount: Medium Exudate Type: Serosanguineous Exudate Color: red, brown Foul Odor After Cleansing: No Slough/Fibrino Yes Wound Bed Granulation Amount: Large (67-100%) Exposed Structure Granulation Quality: Red Fascia Exposed: No Necrotic Amount: Small (1-33%) Fat Layer (Subcutaneous Tissue) Exposed: Yes Necrotic Quality: Adherent Slough Tendon Exposed: No Muscle Exposed: No Joint Exposed: No Bone Exposed: No Treatment Notes Wound #10 (Lower Leg) Wound Laterality: Right, Posterior Cleanser Normal Saline Hapke, Wolf A. ([id number]) Discharge Instruction: Wash your hands  with soap and water. Remove old dressing, discard into plastic bag and place into trash. Cleanse the wound with Normal Saline prior to applying a clean dressing using gauze sponges, not tissues or cotton balls. Do not scrub or use excessive force. Pat dry using gauze sponges, not tissue or cotton balls. Soap and Water Discharge Instruction: Gently cleanse wound with antibacterial soap, rinse and pat dry prior to dressing wounds Wound Cleanser Discharge Instruction: Wash your hands with soap and water. Remove old dressing, discard into plastic bag and place into trash. Cleanse the wound with Wound Cleanser prior to applying a clean dressing using gauze sponges, not tissues or cotton balls. Do not scrub or use excessive force. Pat dry using gauze sponges, not tissue or cotton balls. Peri-Wound Care Topical Triamcinolone Acetonide Cream, 0.1%, 15 (g) tube Discharge Instruction: Mixed with half Eucerin or alternative-to bilateral lower legsApply as directed by . Eucerin lotion Discharge Instruction: Mixed  1/2 with TCA to bilateral lower legs Primary Dressing Iodosorb 40 (g) Discharge Instruction: Apply IodoSorb to wound bed only as directed. Secondary Dressing Zetuvit Plus Silicone Non-bordered 5x5 (in/in) Secured With Compression Wrap Profore Lite LF 3 Multilayer Compression Bandaging System Discharge Instruction: Apply 3 multi-layer wrap as prescribed. Compression Stockings Add-Ons Electronic Signature(s) Signed: 06/02/2021 12:52:45 PM By: Donnamarie Poag Entered By: Donnamarie Poag on 06/02/2021 11:07:22 Liebert, Nanine Means ([id number]) -------------------------------------------------------------------------------- Wound Assessment Details Patient Name: Dupler, Hobson A. Date of Service: 06/02/2021 10:45 AM Medical Record Number: [id number] Patient Account Number: [id number] Date of Birth/Sex: 04/10/1956 (66 y.o. M) Treating RN: Donnamarie Poag Primary Care : Fulton Reek Other Clinician: Referring : Fulton Reek Treating /Extender: Skipper Cliche in Treatment: 1 Wound Status Wound Number: 7 Primary Pressure Ulcer Etiology: Wound Location: Left Calcaneus Wound Open Wounding Event: Gradually Appeared Status: Date Acquired: 05/05/2021 Comorbid Lymphedema, Asthma, Chronic Obstructive Pulmonary Weeks Of Treatment: 1 History: Disease (COPD), Sleep Apnea, Arrhythmia, Congestive Clustered Wound: No Heart Failure, Coronary Artery Disease, Hypertension, Pending Amputation On Presentation Myocardial Infarction, Peripheral Venous Disease, Colitis, Type II Diabetes, History of pressure wounds, Gout, Osteoarthritis Photos Wound Measurements Length: (cm) 2.5 % R Width: (cm) 3.3 % R Depth: (cm) 1 Area: (cm) 6.48 Volume: (cm) 6.48 eduction in Area: -13.6% eduction in Volume: -13.6% Wound Description Classification: Category/Stage III Fo Exudate Amount: Large Sl Exudate Type: Serosanguineous Exudate Color: red, brown ul Odor After Cleansing:  No ough/Fibrino Yes Wound Bed Granulation Amount: Small (1-33%) Exposed Structure Granulation Quality: Pink Fascia Exposed: No Necrotic Amount: Large (67-100%) Fat Layer (Subcutaneous Tissue) Exposed: Yes Necrotic Quality: Eschar, Adherent Slough Tendon Exposed: No Muscle Exposed: No Joint Exposed: No Bone Exposed: No Treatment Notes Wound #7 (Calcaneus) Wound Laterality: Left Cleanser Normal Saline Girardin, Dare A. ([id number]) Discharge Instruction: Wash your hands with soap and water. Remove old dressing, discard into plastic bag and place into trash. Cleanse the wound with Normal Saline prior to applying a clean dressing using gauze sponges, not tissues or cotton balls. Do not scrub or use excessive force. Pat dry using gauze sponges, not tissue or cotton balls. Soap and Water Discharge Instruction: Gently cleanse wound with antibacterial soap, rinse and pat dry prior to dressing wounds Wound Cleanser Discharge Instruction: Wash your hands with soap and water. Remove old dressing, discard into plastic bag and place into trash. Cleanse the wound with Wound Cleanser prior to applying a clean dressing using gauze sponges, not tissues or cotton balls. Do not scrub or use excessive force. Pat dry using gauze sponges, not tissue or cotton  balls. Peri-Wound Care Topical Triamcinolone Acetonide Cream, 0.1%, 15 (g) tube Discharge Instruction: Mixed with half Eucerin or alternative-to bilateral lower legsApply as directed by . Eucerin lotion Discharge Instruction: Mixed 1/2 with TCA to bilateral lower legs Primary Dressing Iodosorb 40 (g) Discharge Instruction: Apply IodoSorb to wound bed only as directed. Secondary Dressing Zetuvit Plus Silicone Non-bordered 5x5 (in/in) Secured With Compression Wrap Profore Lite LF 3 Multilayer Compression Bandaging System Discharge Instruction: Apply 3 multi-layer wrap as prescribed. Compression Stockings Add-Ons Electronic  Signature(s) Signed: 06/02/2021 12:52:45 PM By: Donnamarie Poag Entered By: Donnamarie Poag on 06/02/2021 11:07:54 Myhre, Nanine Means ([id number]) -------------------------------------------------------------------------------- Wound Assessment Details Patient Name: Menken, Viet A. Date of Service: 06/02/2021 10:45 AM Medical Record Number: [id number] Patient Account Number: [id number] Date of Birth/Sex: [birth year] (66 y.o. M) Treating RN: Donnamarie Poag Primary Care : Fulton Reek Other Clinician: Referring : Fulton Reek Treating /Extender: Skipper Cliche in Treatment: 1 Wound Status Wound Number: 9 Primary Pressure Ulcer Etiology: Wound Location: Right Toe Great Secondary Diabetic Wound/Ulcer of the Lower Extremity Wounding Event: Gradually Appeared Etiology: Date Acquired: 04/26/2021 Wound Open Weeks Of Treatment: 1 Status: Clustered Wound: No Comorbid Lymphedema, Asthma, Chronic Obstructive Pulmonary Pending Amputation On Presentation History: Disease (COPD), Sleep Apnea, Arrhythmia, Congestive Heart Failure, Coronary Artery Disease, Hypertension, Myocardial Infarction, Peripheral Venous Disease, Colitis, Type II Diabetes, History of pressure wounds, Gout, Osteoarthritis Photos Wound Measurements Length: (cm) 1 % Reduc Width: (cm) 1 % Reduc Depth: (cm) 0.1 Area: (cm) 0.785 Volume: (cm) 0.079 tion in Area: -25% tion in Volume: -25.4% Wound Description Classification: Category/Stage III Foul O Exudate Amount: Medium Slough Exudate Type: Serosanguineous Exudate Color: red, brown dor After Cleansing: No /Fibrino Yes Wound Bed Granulation Amount: Large (67-100%) Exposed Structure Granulation Quality: Red, Pink Fascia Exposed: No Necrotic Amount: Small (1-33%) Fat Layer (Subcutaneous Tissue) Exposed: Yes Necrotic Quality: Adherent Slough Tendon Exposed: No Muscle Exposed: No Joint Exposed: No Bone Exposed: No Treatment Notes Wound #9  (Toe Great) Wound Laterality: Right Cleanser Yip, Erasmus A. ([id number]) Normal Saline Discharge Instruction: Wash your hands with soap and water. Remove old dressing, discard into plastic bag and place into trash. Cleanse the wound with Normal Saline prior to applying a clean dressing using gauze sponges, not tissues or cotton balls. Do not scrub or use excessive force. Pat dry using gauze sponges, not tissue or cotton balls. Soap and Water Discharge Instruction: Gently cleanse wound with antibacterial soap, rinse and pat dry prior to dressing wounds Wound Cleanser Discharge Instruction: Wash your hands with soap and water. Remove old dressing, discard into plastic bag and place into trash. Cleanse the wound with Wound Cleanser prior to applying a clean dressing using gauze sponges, not tissues or cotton balls. Do not scrub or use excessive force. Pat dry using gauze sponges, not tissue or cotton balls. Peri-Wound Care Topical Triamcinolone Acetonide Cream, 0.1%, 15 (g) tube Discharge Instruction: Mixed with half Eucerin or alternative-to bilateral lower legsApply as directed by . Eucerin lotion Discharge Instruction: Mixed 1/2 with TCA to bilateral lower legs Primary Dressing Iodosorb 40 (g) Discharge Instruction: Apply IodoSorb to wound bed only as directed. Secondary Dressing Zetuvit Plus Silicone Non-bordered 5x5 (in/in) Secured With Compression Wrap Profore Lite LF 3 Multilayer Compression Bandaging System Discharge Instruction: Apply 3 multi-layer wrap as prescribed. Compression Stockings Add-Ons Electronic Signature(s) Signed: 06/02/2021 12:52:45 PM By: Donnamarie Poag Entered By: Donnamarie Poag on 06/02/2021 11:08:33 Jefferys, Nanine Means ([id number]) -------------------------------------------------------------------------------- Vitals Details Patient Name: Ditommaso, Deloss A. Date of Service: 06/02/2021 10:45 AM Medical  Record Number: [id number] Patient Account Number:  [id number] Date of Birth/Sex: [birth year] (66 y.o. M) Treating RN: Donnamarie Poag Primary Care Tillie Viverette: Fulton Reek Other Clinician: Referring Azadeh Hyder: Fulton Reek Treating Hazyl Marseille/Extender: Skipper Cliche in Treatment: 1 Vital Signs Time Taken: 10:50 Temperature (F): 97.8 Height (in): 73 Pulse (bpm): 106 Weight (lbs): 371 Respiratory Rate (breaths/min): 18 Body Mass Index (BMI): 48.9 Blood Pressure (mmHg): 149/88 Reference Range: 80 - 120 mg / dl Electronic Signature(s) Signed: 06/02/2021 12:52:45 PM By: Donnamarie Poag Entered ByDonnamarie Poag on 06/02/2021 10:52:38

## 2021-06-02 NOTE — Progress Notes (Addendum)
REIS, GOGA ([id number]) Visit Report for 06/02/2021 Chief Complaint Document Details Patient Name: [name], [name]. Date of Service: 06/02/2021 10:45 AM Medical Record Number: [id number] Patient Account Number: [id number] Date of Birth/Sex: 08/24/1955 (Wood y.o. M) Treating RN: Donnamarie Poag Primary Care Provider: Fulton Wood Other Clinician: Referring Provider: Fulton Wood Treating Provider/Extender: Skipper Cliche in Treatment: 1 Information Obtained from: Patient Chief Complaint Bilateral LE Ulcers Electronic Signature(s) Signed: 06/02/2021 10:48:40 AM By: Worthy Keeler PA-C Entered By: Worthy Keeler on 06/02/2021 10:48:40 [name], [name] ([id number]) -------------------------------------------------------------------------------- Debridement Details Patient Name: [name]. Date of Service: 06/02/2021 10:45 AM Medical Record Number: [id number] Patient Account Number: [id number] Date of Birth/Sex: March 23, 1956 (66 y.o. M) Treating RN: Donnamarie Poag Primary Care Provider: Fulton Wood Other Clinician: Referring Provider: Fulton Wood Treating Provider/Extender: Skipper Cliche in Treatment: 1 Debridement Performed for Wound #7 Left Calcaneus Assessment: Performed By: Physician Tommie Sams., PA-C Debridement Type: Debridement Level of Consciousness (Pre- Awake and Alert procedure): Pre-procedure Verification/Time Out Yes - 11:18 Taken: Start Time: 11:19 Pain Control: Lidocaine Total Area Debrided (L x W): 2 (cm) x 2 (cm) = 4 (cm) Tissue and other material Viable, Non-Viable, Slough, Subcutaneous, Slough debrided: Level: Skin/Subcutaneous Tissue Debridement Description: Excisional Instrument: Forceps, Scissors Bleeding: Minimum Hemostasis Achieved: Pressure Response to Treatment: Procedure was tolerated well Level of Consciousness (Post- Awake and Alert procedure): Post Debridement Measurements of Total Wound Length: (cm)  2.5 Stage: Category/Stage III Width: (cm) 3.3 Depth: (cm) 1 Volume: (cm) 6.48 Character of Wound/Ulcer Post Debridement: Improved Post Procedure Diagnosis Same as Pre-procedure Electronic Signature(s) Signed: 06/02/2021 12:52:45 PM By: Donnamarie Poag Signed: 06/02/2021 4:48:07 PM By: Worthy Keeler PA-C Entered By: Donnamarie Poag on 06/02/2021 11:23:05 [name], [name] ([id number]) -------------------------------------------------------------------------------- HPI Details Patient Name: [name]. Date of Service: 06/02/2021 10:45 AM Medical Record Number: [id number] Patient Account Number: [id number] Date of Birth/Sex: [birth year] (66 y.o. M) Treating RN: Donnamarie Poag Primary Care Provider: Fulton Wood Other Clinician: Referring Provider: Fulton Wood Treating Provider/Extender: Skipper Cliche in Treatment: 1 History of Present Illness HPI Description: Admission 8/31 [name] is [name] year old male with Wood past medical history of uncontrolled insulin-dependent type 2 diabetes, CAD, osteomyelitis to the left third toe status post partial third ray amputation, and peripheral arterial disease that presents for Wood left plantar foot wound and bilateral lower extremity wounds. The left plantar foot wound has been present for at least 7 months. He has been following podiatry for this issue. He uses silver collagen to this area. He reports that in the past 1 to 2 months he has developed multiple scattered open wounds to his lower extremities bilaterally that have worsened over time. He states that they often develop as either blisters or he hits his leg against an object.He is currently using Betadine to these areas. He denies signs of infection. He has compression stockings however he cannot put these on very easily. Also he is on his feet most of the day. He states he does not have anyone to help him do the activities he needs to get done. Readmission: 05/26/2021 upon  evaluation today patient presents for reevaluation here in the clinic concerning issues he is been having with his bilateral lower extremities, his right great toe, and left heel primarily. With that being said he has been in the hospital recently and his hemoglobin A1c on January 10 was 9.2. With that being said he does have Wood lot of really thickened dry skin attached  to his legs when he try to remove that today. He is in agreement with the plan. Nonetheless I do believe that based on what we are seeing he is been having Wood lot of issues with his congestive heart failure I do have him on fluid pills he does go to the bathroom quite Wood bit. Subsequently has been in [employer] but I do not think that is doing quite enough to help control his edema. He also needs something better than Santyl to put on the heel right now when he was in the hospital they actually cut Wood space open in the compression around his heel so they can open it up and actually apply the Santyl daily. I think he needs something better than that. Again the patient does have Wood history of diabetes mellitus type 2, chronic venous insufficiency, hypertension, and congestive heart failure. He also has signs of stage III lymphedema as well. 06/02/2021 upon evaluation today patient appears to be doing decently well in regard to his wounds. Fortunately I do not see any signs of active infection at this time which is great news. Fortunately I think that he is making excellent progress. Nonetheless I do think that we seem to be making some progress here. Electronic Signature(s) Signed: 06/02/2021 11:30:13 AM By: Worthy Keeler PA-C Entered By: Worthy Keeler on 06/02/2021 11:30:12 [name], [name] ([id number]) -------------------------------------------------------------------------------- Physical Exam Details Patient Name: [name]. Date of Service: 06/02/2021 10:45 AM Medical Record Number: [id number] Patient Account Number:  [id number] Date of Birth/Sex: 1956/03/24 (66 y.o. M) Treating RN: Donnamarie Poag Primary Care Provider: Fulton Wood Other Clinician: Referring Provider: Fulton Wood Treating Provider/Extender: Skipper Cliche in Treatment: 1 Constitutional Well-nourished and well-hydrated in no acute distress. Psychiatric this patient is able to make decisions and demonstrates good insight into disease process. Alert and Oriented x 3. pleasant and cooperative. Notes Upon inspection patient's wound bed actually showed signs of good granulation epithelization at this point. Fortunately I do not see any signs of active infection locally nor systemically which is great news. No fevers, chills, nausea, vomiting, or diarrhea. Overall I think that the Iodoflex is doing Wood good job for him. Electronic Signature(s) Signed: 06/02/2021 11:30:38 AM By: Worthy Keeler PA-C Entered By: Worthy Keeler on 06/02/2021 11:30:38 [name], [name] ([id number]) -------------------------------------------------------------------------------- Physician Orders Details Patient Name: [name], [name]. Date of Service: 06/02/2021 10:45 AM Medical Record Number: [id number] Patient Account Number: [id number] Date of Birth/Sex: 07/14/1955 (66 y.o. M) Treating RN: Donnamarie Poag Primary Care Provider: Fulton Wood Other Clinician: Referring Provider: Fulton Wood Treating Provider/Extender: Skipper Cliche in Treatment: 1 Verbal / Phone Orders: No Diagnosis Coding ICD-10 Coding Code Description [phone] Pressure ulcer of left heel, stage 3 E11.621 Type 2 diabetes mellitus with foot ulcer L97.522 Non-pressure chronic ulcer of other part of left foot with fat layer exposed L89.893 Pressure ulcer of other site, stage 3 I87.2 Venous insufficiency (chronic) (peripheral) I50.42 Chronic combined systolic (congestive) and diastolic (congestive) heart failure I10 Essential (primary) hypertension Follow-up Appointments o  Return Appointment in 1 week. o Nurse Visit as needed [place] for wound care. May utilize formulary equivalent dressing for wound treatment orders unless otherwise specified. Home Health Nurse may visit [facility] to address patientos wound care needs. o Scheduled days for dressing changes to be completed; exception, patient has scheduled wound care visit that day. - HH to wrap twice weekly and at  wound center one time unless he is unable to have appt at wound center that week o **Please direct any NON-WOUND related issues/requests for orders to patient's Primary Care Physician. **If current dressing causes regression in wound condition, may D/C ordered dressing product/s and apply Normal Saline Moist Dressing daily until next [place] or Other MD appointment. **Notify Wound Healing Center of regression in wound condition at [phone]. Bathing/ Shower/ Hygiene o May shower with wound dressing protected with water repellent cover or cast protector. o No tub bath. Anesthetic (Use 'Patient Medications' Section for Anesthetic Order Entry) o Lidocaine applied to wound bed Edema Control - Lymphedema / Segmental Compressive Device / Other o Optional: One layer of unna paste to top of compression wrap (to act as an anchor). o Elevate legs to the level of the heart and pump ankles as often as possible o Elevate leg(s) parallel to the floor when sitting. o DO YOUR BEST to sleep in the bed at night. DO NOT sleep in your recliner. Long hours of sitting in Wood recliner leads to swelling of the legs and/or potential wounds on your backside. Non-Wound Condition o Additional non-wound orders/instructions: - Triamcinolone mixed with lotion to bilateral lower legs gauze between toes to keep dry; bandaid to cover newly healed skin to toe Off-Loading o Open toe surgical shoe with peg assist. - L heel wedge shoe o  Turn and reposition every 2 hours Additional Orders / Instructions o Follow Nutritious Diet and Increase Protein Intake - monitor blood sugar Wound Treatment Wound #10 - Lower Leg Wound Laterality: Right, Posterior Cleanser: Normal Saline 3 x Per Week/15 Days [name], [name]. ([id number]) Discharge Instructions: Wash your hands with soap and water. Remove old dressing, discard into plastic bag and place into trash. Cleanse the wound with Normal Saline prior to applying Wood clean dressing using gauze sponges, not tissues or cotton balls. Do not scrub or use excessive force. Pat dry using gauze sponges, not tissue or cotton balls. Cleanser: Soap and Water 3 x Per Week/15 Days Discharge Instructions: Gently cleanse wound with antibacterial soap, rinse and pat dry prior to dressing wounds Cleanser: Wound Cleanser 3 x Per Week/15 Days Discharge Instructions: Wash your hands with soap and water. Remove old dressing, discard into plastic bag and place into trash. Cleanse the wound with Wound Cleanser prior to applying Wood clean dressing using gauze sponges, not tissues or cotton balls. Do not scrub or use excessive force. Pat dry using gauze sponges, not tissue or cotton balls. Topical: Triamcinolone Acetonide Cream, 0.1%, 15 (g) tube 3 x Per Week/15 Days Discharge Instructions: Mixed with half Eucerin or alternative-to bilateral lower legsApply as directed by provider. Topical: Eucerin lotion 3 x Per Week/15 Days Discharge Instructions: Mixed 1/2 with TCA to bilateral lower legs Primary Dressing: Iodosorb 40 (g) 3 x Per Week/15 Days Discharge Instructions: Apply IodoSorb to wound bed only as directed. Secondary Dressing: Zetuvit Plus Silicone Non-bordered 5x5 (in/in) 3 x Per Week/15 Days Compression Wrap: Profore Lite LF 3 Multilayer Compression Bandaging System 3 x Per Week/15 Days Discharge Instructions: Apply 3 multi-layer wrap as prescribed. Wound #7 - Calcaneus Wound Laterality:  Left Cleanser: Normal Saline 3 x Per Week/15 Days Discharge Instructions: Wash your hands with soap and water. Remove old dressing, discard into plastic bag and place into trash. Cleanse the wound with Normal Saline prior to applying Wood clean dressing using gauze sponges, not tissues or cotton balls. Do not scrub or use excessive force. Pat dry using gauze  sponges, not tissue or cotton balls. Cleanser: Soap and Water 3 x Per Week/15 Days Discharge Instructions: Gently cleanse wound with antibacterial soap, rinse and pat dry prior to dressing wounds Cleanser: Wound Cleanser 3 x Per Week/15 Days Discharge Instructions: Wash your hands with soap and water. Remove old dressing, discard into plastic bag and place into trash. Cleanse the wound with Wound Cleanser prior to applying Wood clean dressing using gauze sponges, not tissues or cotton balls. Do not scrub or use excessive force. Pat dry using gauze sponges, not tissue or cotton balls. Topical: Triamcinolone Acetonide Cream, 0.1%, 15 (g) tube 3 x Per Week/15 Days Discharge Instructions: Mixed with half Eucerin or alternative-to bilateral lower legsApply as directed by provider. Topical: Eucerin lotion 3 x Per Week/15 Days Discharge Instructions: Mixed 1/2 with TCA to bilateral lower legs Primary Dressing: Iodosorb 40 (g) 3 x Per Week/15 Days Discharge Instructions: Apply IodoSorb to wound bed only as directed. Secondary Dressing: Zetuvit Plus Silicone Non-bordered 5x5 (in/in) 3 x Per Week/15 Days Compression Wrap: Profore Lite LF 3 Multilayer Compression Bandaging System 3 x Per Week/15 Days Discharge Instructions: Apply 3 multi-layer wrap as prescribed. Wound #9 - Toe Great Wound Laterality: Right Cleanser: Normal Saline 3 x Per Week/15 Days Discharge Instructions: Wash your hands with soap and water. Remove old dressing, discard into plastic bag and place into trash. Cleanse the wound with Normal Saline prior to applying Wood clean dressing using  gauze sponges, not tissues or cotton balls. Do not scrub or use excessive force. Pat dry using gauze sponges, not tissue or cotton balls. Cleanser: Soap and Water 3 x Per Week/15 Days Discharge Instructions: Gently cleanse wound with antibacterial soap, rinse and pat dry prior to dressing wounds Cleanser: Wound Cleanser 3 x Per Week/15 Days Discharge Instructions: Wash your hands with soap and water. Remove old dressing, discard into plastic bag and place into trash. Cleanse the wound with Wound Cleanser prior to applying Wood clean dressing using gauze sponges, not tissues or cotton balls. Do not scrub or use excessive force. Pat dry using gauze sponges, not tissue or cotton balls. [name], [name] ([id number]) Topical: Triamcinolone Acetonide Cream, 0.1%, 15 (g) tube 3 x Per Week/15 Days Discharge Instructions: Mixed with half Eucerin or alternative-to bilateral lower legsApply as directed by provider. Topical: Eucerin lotion 3 x Per Week/15 Days Discharge Instructions: Mixed 1/2 with TCA to bilateral lower legs Primary Dressing: Iodosorb 40 (g) 3 x Per Week/15 Days Discharge Instructions: Apply IodoSorb to wound bed only as directed. Secondary Dressing: Zetuvit Plus Silicone Non-bordered 5x5 (in/in) 3 x Per Week/15 Days Compression Wrap: Profore Lite LF 3 Multilayer Compression Bandaging System 3 x Per Week/15 Days Discharge Instructions: Apply 3 multi-layer wrap as prescribed. Electronic Signature(s) Signed: 06/02/2021 12:52:45 PM By: Donnamarie Poag Signed: 06/02/2021 4:48:07 PM By: Worthy Keeler PA-C Entered By: Donnamarie Poag on 06/02/2021 11:45:27 [name], [name] ([id number]) -------------------------------------------------------------------------------- Problem List Details Patient Name: [name], [name]. Date of Service: 06/02/2021 10:45 AM Medical Record Number: [id number] Patient Account Number: [id number] Date of Birth/Sex: 07-May-1955 (66 y.o. M) Treating RN: Donnamarie Poag Primary  Care Provider: Fulton Wood Other Clinician: Referring Provider: Fulton Wood Treating Provider/Extender: Skipper Cliche in Treatment: 1 Active Problems ICD-10 Encounter Code Description Active Date MDM Diagnosis [phone] Pressure ulcer of left heel, stage 3 05/26/2021 No Yes E11.621 Type 2 diabetes mellitus with foot ulcer 05/26/2021 No Yes L97.522 Non-pressure chronic ulcer of other part of left foot with fat layer 05/26/2021 No Yes exposed  Q68.341 Pressure ulcer of other site, stage 3 05/26/2021 No Yes I87.2 Venous insufficiency (chronic) (peripheral) 05/26/2021 No Yes I50.42 Chronic combined systolic (congestive) and diastolic (congestive) heart 05/26/2021 No Yes failure I10 Essential (primary) hypertension 05/26/2021 No Yes Inactive Problems Resolved Problems Electronic Signature(s) Signed: 06/02/2021 10:48:35 AM By: Worthy Keeler PA-C Entered By: Worthy Keeler on 06/02/2021 10:48:35 [name], [name] ([id number]) -------------------------------------------------------------------------------- Progress Note Details Patient Name: [name], [name]. Date of Service: 06/02/2021 10:45 AM Medical Record Number: [id number] Patient Account Number: [id number] Date of Birth/Sex: 08-01-1955 (66 y.o. M) Treating RN: Donnamarie Poag Primary Care Provider: Fulton Wood Other Clinician: Referring Provider: Fulton Wood Treating Provider/Extender: Skipper Cliche in Treatment: 1 Subjective Chief Complaint Information obtained from Patient Bilateral LE Ulcers History of Present Illness (HPI) Admission 8/31 Mr. lawernce earll is [name] year old male with Wood past medical history of uncontrolled insulin-dependent type 2 diabetes, CAD, osteomyelitis to the left third toe status post partial third ray amputation, and peripheral arterial disease that presents for Wood left plantar foot wound and bilateral lower extremity wounds. The left plantar foot wound has been present for at least 7  months. He has been following podiatry for this issue. He uses silver collagen to this area. He reports that in the past 1 to 2 months he has developed multiple scattered open wounds to his lower extremities bilaterally that have worsened over time. He states that they often develop as either blisters or he hits his leg against an object.He is currently using Betadine to these areas. He denies signs of infection. He has compression stockings however he cannot put these on very easily. Also he is on his feet most of the day. He states he does not have anyone to help him do the activities he needs to get done. Readmission: 05/26/2021 upon evaluation today patient presents for reevaluation here in the clinic concerning issues he is been having with his bilateral lower extremities, his right great toe, and left heel primarily. With that being said he has been in the hospital recently and his hemoglobin A1c on January 10 was 9.2. With that being said he does have Wood lot of really thickened dry skin attached to his legs when he try to remove that today. He is in agreement with the plan. Nonetheless I do believe that based on what we are seeing he is been having Wood lot of issues with his congestive heart failure I do have him on fluid pills he does go to the bathroom quite Wood bit. Subsequently has been in [employer] but I do not think that is doing quite enough to help control his edema. He also needs something better than Santyl to put on the heel right now when he was in the hospital they actually cut Wood space open in the compression around his heel so they can open it up and actually apply the Santyl daily. I think he needs something better than that. Again the patient does have Wood history of diabetes mellitus type 2, chronic venous insufficiency, hypertension, and congestive heart failure. He also has signs of stage III lymphedema as well. 06/02/2021 upon evaluation today patient appears to be doing  decently well in regard to his wounds. Fortunately I do not see any signs of active infection at this time which is great news. Fortunately I think that he is making excellent progress. Nonetheless I do think that we seem to be making some progress here. Objective Constitutional Well-nourished and well-hydrated in no  acute distress. Vitals Time Taken: 10:50 AM, Height: 73 in, Weight: 371 lbs, BMI: 48.9, Temperature: 97.8 F, Pulse: 106 bpm, Respiratory Rate: 18 breaths/min, Blood Pressure: 149/88 mmHg. Psychiatric this patient is able to make decisions and demonstrates good insight into disease process. Alert and Oriented x 3. pleasant and cooperative. General Notes: Upon inspection patient's wound bed actually showed signs of good granulation epithelization at this point. Fortunately I do not see any signs of active infection locally nor systemically which is great news. No fevers, chills, nausea, vomiting, or diarrhea. Overall I think that the Iodoflex is doing Wood good job for him. Integumentary (Hair, Skin) Wound #10 status is Open. Original cause of wound was Gradually Appeared. The date acquired was: 05/26/2021. The wound has been in treatment 1 weeks. The wound is located on the Right,Posterior Lower Leg. The wound measures 0.5cm length x 0.5cm width x 0.1cm depth; 0.196cm^2 area and 0.02cm^3 volume. There is Fat Layer (Subcutaneous Tissue) exposed. There is Wood medium amount of serosanguineous drainage noted. There is large (Wood-100%) red granulation within the wound bed. There is Wood small (1-33%) amount of necrotic tissue within the wound bed including Adherent Slough. [name], [name]. ([id number]) Wound #7 status is Open. Original cause of wound was Gradually Appeared. The date acquired was: 05/05/2021. The wound has been in treatment 1 weeks. The wound is located on the Left Calcaneus. The wound measures 2.5cm length x 3.3cm width x 1cm depth; 6.48cm^2 area and 6.48cm^3 volume. There is  Fat Layer (Subcutaneous Tissue) exposed. There is Wood large amount of serosanguineous drainage noted. There is small (1-33%) pink granulation within the wound bed. There is Wood large (Wood-100%) amount of necrotic tissue within the wound bed including Eschar and Adherent Slough. Wound #9 status is Open. Original cause of wound was Gradually Appeared. The date acquired was: 04/26/2021. The wound has been in treatment 1 weeks. The wound is located on the Right Toe Great. The wound measures 1cm length x 1cm width x 0.1cm depth; 0.785cm^2 area and 0.079cm^3 volume. There is Fat Layer (Subcutaneous Tissue) exposed. There is Wood medium amount of serosanguineous drainage noted. There is large (Wood-100%) red, pink granulation within the wound bed. There is Wood small (1-33%) amount of necrotic tissue within the wound bed including Adherent Slough. Other Condition(s) Patient presents with Lymphedema located on the Left Leg. Patient presents with Lymphedema located on the Right Leg. Assessment Active Problems ICD-10 Pressure ulcer of left heel, stage 3 Type 2 diabetes mellitus with foot ulcer Non-pressure chronic ulcer of other part of left foot with fat layer exposed Pressure ulcer of other site, stage 3 Venous insufficiency (chronic) (peripheral) Chronic combined systolic (congestive) and diastolic (congestive) heart failure Essential (primary) hypertension Procedures Wound #7 Pre-procedure diagnosis of Wound #7 is Wood Pressure Ulcer located on the Left Calcaneus . There was Wood Excisional Skin/Subcutaneous Tissue Debridement with Wood total area of 4 sq cm performed by Tommie Sams., PA-C. With the following instrument(s): Forceps, and Scissors to remove Viable and Non-Viable tissue/material. Material removed includes Subcutaneous Tissue and Slough and after achieving pain control using Lidocaine. Wood time out was conducted at 11:18, prior to the start of the procedure. Wood Minimum amount of bleeding was controlled  with Pressure. The procedure was tolerated well. Post Debridement Measurements: 2.5cm length x 3.3cm width x 1cm depth; 6.48cm^3 volume. Post debridement Stage noted as Category/Stage III. Character of Wound/Ulcer Post Debridement is improved. Post procedure Diagnosis Wound #7: Same as Pre-Procedure Pre-procedure diagnosis of Wound #  7 is Wood Pressure Ulcer located on the Left Calcaneus . There was Wood Three Layer Compression Therapy Procedure by Donnamarie Poag, RN. Post procedure Diagnosis Wound #7: Same as Pre-Procedure Wound #10 Pre-procedure diagnosis of Wound #10 is Wood Venous Leg Ulcer located on the Right,Posterior Lower Leg . There was Wood Three Layer Compression Therapy Procedure by Donnamarie Poag, RN. Post procedure Diagnosis Wound #10: Same as Pre-Procedure Plan Follow-up Appointments: Return Appointment in 1 week. Nurse Visit as needed Home Health: [place]: - [place] for wound care. May utilize formulary equivalent dressing for wound treatment orders unless otherwise specified. Home Health Nurse may visit PRN to address patient s wound care needs. Whiting, Justus Wood. ([id number]) Scheduled days for dressing changes to be completed; exception, patient has scheduled wound care visit that day. - HH to wrap twice weekly and at wound center one time unless he is unable to have appt at wound center that week **Please direct any NON-WOUND related issues/requests for orders to patient's Primary Care Physician. **If current dressing causes regression in wound condition, may D/C ordered dressing product/s and apply Normal Saline Moist Dressing daily until next [place] or Other MD appointment. **Notify [facility] of regression in wound condition at [phone]. Bathing/ Shower/ Hygiene: May shower with wound dressing protected with water repellent cover or cast protector. No tub bath. Anesthetic (Use 'Patient Medications' Section for Anesthetic  Order Entry): Lidocaine applied to wound bed Edema Control - Lymphedema / Segmental Compressive Device / Other: Optional: One layer of unna paste to top of compression wrap (to act as an anchor). Elevate legs to the level of the heart and pump ankles as often as possible Elevate leg(s) parallel to the floor when sitting. DO YOUR BEST to sleep in the bed at night. DO NOT sleep in your recliner. Long hours of sitting in Wood recliner leads to swelling of the legs and/or potential wounds on your backside. Non-Wound Condition: Additional non-wound orders/instructions: - Triamcinolone mixed with lotion to bilateral lower legs Off-Loading: Open toe surgical shoe with peg assist. - L heel wedge shoe Turn and reposition every 2 hours Additional Orders / Instructions: Follow Nutritious Diet and Increase Protein Intake - monitor blood sugar WOUND #10: - Lower Leg Wound Laterality: Right, Posterior Cleanser: Normal Saline 3 x Per Week/15 Days Discharge Instructions: Wash your hands with soap and water. Remove old dressing, discard into plastic bag and place into trash. Cleanse the wound with Normal Saline prior to applying Wood clean dressing using gauze sponges, not tissues or cotton balls. Do not scrub or use excessive force. Pat dry using gauze sponges, not tissue or cotton balls. Cleanser: Soap and Water 3 x Per Week/15 Days Discharge Instructions: Gently cleanse wound with antibacterial soap, rinse and pat dry prior to dressing wounds Cleanser: Wound Cleanser 3 x Per Week/15 Days Discharge Instructions: Wash your hands with soap and water. Remove old dressing, discard into plastic bag and place into trash. Cleanse the wound with Wound Cleanser prior to applying Wood clean dressing using gauze sponges, not tissues or cotton balls. Do not scrub or use excessive force. Pat dry using gauze sponges, not tissue or cotton balls. Topical: Triamcinolone Acetonide Cream, 0.1%, 15 (g) tube 3 x Per Week/15  Days Discharge Instructions: Mixed with half Eucerin or alternative-to bilateral lower legsApply as directed by provider. Topical: Eucerin lotion 3 x Per Week/15 Days Discharge Instructions: Mixed 1/2 with TCA to bilateral lower legs Primary Dressing: Iodosorb 40 (g)  3 x Per Week/15 Days Discharge Instructions: Apply IodoSorb to wound bed only as directed. Secondary Dressing: Zetuvit Plus Silicone Non-bordered 5x5 (in/in) 3 x Per Week/15 Days Compression Wrap: Profore Lite LF 3 Multilayer Compression Bandaging System 3 x Per Week/15 Days Discharge Instructions: Apply 3 multi-layer wrap as prescribed. WOUND #7: - Calcaneus Wound Laterality: Left Cleanser: Normal Saline 3 x Per Week/15 Days Discharge Instructions: Wash your hands with soap and water. Remove old dressing, discard into plastic bag and place into trash. Cleanse the wound with Normal Saline prior to applying Wood clean dressing using gauze sponges, not tissues or cotton balls. Do not scrub or use excessive force. Pat dry using gauze sponges, not tissue or cotton balls. Cleanser: Soap and Water 3 x Per Week/15 Days Discharge Instructions: Gently cleanse wound with antibacterial soap, rinse and pat dry prior to dressing wounds Cleanser: Wound Cleanser 3 x Per Week/15 Days Discharge Instructions: Wash your hands with soap and water. Remove old dressing, discard into plastic bag and place into trash. Cleanse the wound with Wound Cleanser prior to applying Wood clean dressing using gauze sponges, not tissues or cotton balls. Do not scrub or use excessive force. Pat dry using gauze sponges, not tissue or cotton balls. Topical: Triamcinolone Acetonide Cream, 0.1%, 15 (g) tube 3 x Per Week/15 Days Discharge Instructions: Mixed with half Eucerin or alternative-to bilateral lower legsApply as directed by provider. Topical: Eucerin lotion 3 x Per Week/15 Days Discharge Instructions: Mixed 1/2 with TCA to bilateral lower legs Primary Dressing:  Iodosorb 40 (g) 3 x Per Week/15 Days Discharge Instructions: Apply IodoSorb to wound bed only as directed. Secondary Dressing: Zetuvit Plus Silicone Non-bordered 5x5 (in/in) 3 x Per Week/15 Days Compression Wrap: Profore Lite LF 3 Multilayer Compression Bandaging System 3 x Per Week/15 Days Discharge Instructions: Apply 3 multi-layer wrap as prescribed. WOUND #9: - Toe Great Wound Laterality: Right Cleanser: Normal Saline 3 x Per Week/15 Days Discharge Instructions: Wash your hands with soap and water. Remove old dressing, discard into plastic bag and place into trash. Cleanse the wound with Normal Saline prior to applying Wood clean dressing using gauze sponges, not tissues or cotton balls. Do not scrub or use excessive force. Pat dry using gauze sponges, not tissue or cotton balls. Cleanser: Soap and Water 3 x Per Week/15 Days Discharge Instructions: Gently cleanse wound with antibacterial soap, rinse and pat dry prior to dressing wounds Cleanser: Wound Cleanser 3 x Per Week/15 Days Discharge Instructions: Wash your hands with soap and water. Remove old dressing, discard into plastic bag and place into trash. Cleanse the wound with Wound Cleanser prior to applying Wood clean dressing using gauze sponges, not tissues or cotton balls. Do not scrub or use excessive force. Pat dry using gauze sponges, not tissue or cotton balls. Topical: Triamcinolone Acetonide Cream, 0.1%, 15 (g) tube 3 x Per Week/15 Days Discharge Instructions: Mixed with half Eucerin or alternative-to bilateral lower legsApply as directed by provider. Topical: Eucerin lotion 3 x Per Week/15 Days Discharge Instructions: Mixed 1/2 with TCA to bilateral lower legs Primary Dressing: Iodosorb 40 (g) 3 x Per Week/15 Days Discharge Instructions: Apply IodoSorb to wound bed only as directed. Conkey, OLANREWAJU OSBORN ([id number]) Secondary Dressing: Zetuvit Plus Silicone Non-bordered 5x5 (in/in) 3 x Per Week/15 Days Compression Wrap: Profore  Lite LF 3 Multilayer Compression Bandaging System 3 x Per Week/15 Days Discharge Instructions: Apply 3 multi-layer wrap as prescribed. 1. Would recommend that we going continue with the wound care measures as before  the patient is in agreement with plan. This includes the use of the Iodoflex to all wounds which I think is doing Wood good job. 2. I am also going to recommend that we have the patient continue with the compression wrapping which I think is also doing Wood good job here for him. We have been doing Wood 3 layer compression wrap. We will see patient back for reevaluation in 1 week here in the clinic. If anything worsens or changes patient will contact our office for additional recommendations. Electronic Signature(s) Signed: 06/02/2021 11:31:18 AM By: Worthy Keeler PA-C Entered By: Worthy Keeler on 06/02/2021 11:31:18 Gama, [name] ([id number]) -------------------------------------------------------------------------------- SuperBill Details Patient Name: [name]. Date of Service: 06/02/2021 Medical Record Number: [id number] Patient Account Number: [id number] Date of Birth/Sex: Dec 07, 1955 (66 y.o. M) Treating RN: Donnamarie Poag Primary Care Provider: Fulton Wood Other Clinician: Referring Provider: Fulton Wood Treating Provider/Extender: Skipper Cliche in Treatment: 1 Diagnosis Coding ICD-10 Codes Code Description [phone] Pressure ulcer of left heel, stage 3 E11.621 Type 2 diabetes mellitus with foot ulcer L97.522 Non-pressure chronic ulcer of other part of left foot with fat layer exposed L89.893 Pressure ulcer of other site, stage 3 I87.2 Venous insufficiency (chronic) (peripheral) I50.42 Chronic combined systolic (congestive) and diastolic (congestive) heart failure I10 Essential (primary) hypertension Facility Procedures CPT4 Code: [id number] Description: 29528 - DEB SUBQ TISSUE 20 SQ CM/< Modifier: Quantity: 1 CPT4 Code: Description: ICD-10 Diagnosis  Description L89.623 Pressure ulcer of left heel, stage 3 Modifier: Quantity: Physician Procedures CPT4 Code: [id number] Description: 11042 - WC PHYS SUBQ TISS 20 SQ CM Modifier: Quantity: 1 CPT4 Code: Description: ICD-10 Diagnosis Description L89.623 Pressure ulcer of left heel, stage 3 Modifier: Quantity: Electronic Signature(s) Signed: 06/02/2021 11:31:26 AM By: Worthy Keeler PA-C Entered By: Worthy Keeler on 06/02/2021 11:31:26

## 2021-06-09 ENCOUNTER — Encounter: Payer: Medicare Other | Admitting: Physician Assistant

## 2021-06-09 ENCOUNTER — Other Ambulatory Visit: Payer: Self-pay

## 2021-06-09 DIAGNOSIS — E875 Hyperkalemia: Secondary | ICD-10-CM | POA: Diagnosis not present

## 2021-06-09 DIAGNOSIS — L97818 Non-pressure chronic ulcer of other part of right lower leg with other specified severity: Secondary | ICD-10-CM | POA: Diagnosis not present

## 2021-06-09 DIAGNOSIS — Z794 Long term (current) use of insulin: Secondary | ICD-10-CM | POA: Diagnosis not present

## 2021-06-09 DIAGNOSIS — I10 Essential (primary) hypertension: Secondary | ICD-10-CM | POA: Diagnosis not present

## 2021-06-09 DIAGNOSIS — E11621 Type 2 diabetes mellitus with foot ulcer: Secondary | ICD-10-CM | POA: Diagnosis not present

## 2021-06-09 DIAGNOSIS — M1990 Unspecified osteoarthritis, unspecified site: Secondary | ICD-10-CM | POA: Diagnosis not present

## 2021-06-09 DIAGNOSIS — I5042 Chronic combined systolic (congestive) and diastolic (congestive) heart failure: Secondary | ICD-10-CM | POA: Diagnosis not present

## 2021-06-09 DIAGNOSIS — L89623 Pressure ulcer of left heel, stage 3: Secondary | ICD-10-CM | POA: Diagnosis not present

## 2021-06-09 DIAGNOSIS — L97522 Non-pressure chronic ulcer of other part of left foot with fat layer exposed: Secondary | ICD-10-CM | POA: Diagnosis not present

## 2021-06-09 DIAGNOSIS — L89893 Pressure ulcer of other site, stage 3: Secondary | ICD-10-CM | POA: Diagnosis not present

## 2021-06-09 DIAGNOSIS — I110 Hypertensive heart disease with heart failure: Secondary | ICD-10-CM | POA: Diagnosis not present

## 2021-06-09 DIAGNOSIS — I890 Lymphedema, not elsewhere classified: Secondary | ICD-10-CM | POA: Diagnosis not present

## 2021-06-09 DIAGNOSIS — I872 Venous insufficiency (chronic) (peripheral): Secondary | ICD-10-CM | POA: Diagnosis not present

## 2021-06-09 NOTE — Progress Notes (Addendum)
EMAAD, NANNA ([id number]) Visit Report for 06/09/2021 Chief Complaint Document Details Patient Name: [name], [name]. Date of Service: 06/09/2021 11:00 AM Medical Record Number: [id number] Patient Account Number: [id number] Date of Birth/Sex: 06-Jun-1955 (66 y.o. M) Treating RN: Carlene Coria Primary Care Provider: Fulton Reek Other Clinician: Referring Provider: Fulton Reek Treating Provider/Extender: Skipper Cliche in Treatment: 2 Information Obtained from: Patient Chief Complaint Bilateral LE Ulcers Electronic Signature(s) Signed: 06/09/2021 11:08:01 AM By: Worthy Keeler PA-C Entered By: Worthy Keeler on 06/09/2021 11:08:01 Lesko, Nanine Means ([id number]) -------------------------------------------------------------------------------- HPI Details Patient Name: [name]. Date of Service: 06/09/2021 11:00 AM Medical Record Number: [id number] Patient Account Number: [id number] Date of Birth/Sex: Apr 05, 1956 (66 y.o. M) Treating RN: Carlene Coria Primary Care Provider: Fulton Reek Other Clinician: Referring Provider: Fulton Reek Treating Provider/Extender: Skipper Cliche in Treatment: 2 History of Present Illness HPI Description: Admission 8/31 Mr. justyn langham is Wood 66 year old male with Wood past medical history of uncontrolled insulin-dependent type 2 diabetes, CAD, osteomyelitis to the left third toe status post partial third ray amputation, and peripheral arterial disease that presents for Wood left plantar foot wound and bilateral lower extremity wounds. The left plantar foot wound has been present for at least 7 months. He has been following podiatry for this issue. He uses silver collagen to this area. He reports that in the past 1 to 2 months he has developed multiple scattered open wounds to his lower extremities bilaterally that have worsened over time. He states that they often develop as either blisters or he hits his leg against an  object.He is currently using Betadine to these areas. He denies signs of infection. He has compression stockings however he cannot put these on very easily. Also he is on his feet most of the day. He states he does not have anyone to help him do the activities he needs to get done. Readmission: 05/26/2021 upon evaluation today patient presents for reevaluation here in the clinic concerning issues he is been having with his bilateral lower extremities, his right great toe, and left heel primarily. With that being said he has been in the hospital recently and his hemoglobin A1c on January 10 was 9.2. With that being said he does have Wood lot of really thickened dry skin attached to his legs when he try to remove that today. He is in agreement with the plan. Nonetheless I do believe that based on what we are seeing he is been having Wood lot of issues with his congestive heart failure I do have him on fluid pills he does go to the bathroom quite Wood bit. Subsequently has been in [employer] but I do not think that is doing quite enough to help control his edema. He also needs something better than Santyl to put on the heel right now when he was in the hospital they actually cut Wood space open in the compression around his heel so they can open it up and actually apply the Santyl daily. I think he needs something better than that. Again the patient does have Wood history of diabetes mellitus type 2, chronic venous insufficiency, hypertension, and congestive heart failure. He also has signs of stage III lymphedema as well. 06/02/2021 upon evaluation today patient appears to be doing decently well in regard to his wounds. Fortunately I do not see any signs of active infection at this time which is great news. Fortunately I think that he is making excellent progress. Nonetheless I do think  that we seem to be making some progress here. 06/09/2021 upon evaluation today patient appears to be doing well with regard to his  legs all things considered unfortunately the left heel is not doing nearly as well as what I like to see. There is Wood lot of necrotic tissue this is very boggy. I think that it is going to require potentially much more debridement but again we really need to get the results of the arterial testing done and this is actually scheduled for the 15th. Electronic Signature(s) Signed: 06/09/2021 1:13:56 PM By: Worthy Keeler PA-C Entered By: Worthy Keeler on 06/09/2021 13:13:56 Schneller, Nanine Means ([id number]) -------------------------------------------------------------------------------- Physical Exam Details Patient Name: [name], [name]. Date of Service: 06/09/2021 11:00 AM Medical Record Number: [id number] Patient Account Number: [id number] Date of Birth/Sex: 1956-02-05 (66 y.o. M) Treating RN: Carlene Coria Primary Care Provider: Fulton Reek Other Clinician: Referring Provider: Fulton Reek Treating Provider/Extender: Skipper Cliche in Treatment: 2 Constitutional Well-nourished and well-hydrated in no acute distress. Respiratory normal breathing without difficulty. Psychiatric this patient is able to make decisions and demonstrates good insight into disease process. Alert and Oriented x 3. pleasant and cooperative. Notes Upon inspection patient's wounds showed signs unfortunately being somewhat boggy in regard to the left heel as well as having Wood lot of necrotic tissue here. I think that he is going require some sharp debridement but again we are holding off on trying to get the results of his vascular study that should be the 15th of this month. Electronic Signature(s) Signed: 06/09/2021 1:14:22 PM By: Worthy Keeler PA-C Entered By: Worthy Keeler on 06/09/2021 13:14:22 Bouler, Nanine Means ([id number]) -------------------------------------------------------------------------------- Physician Orders Details Patient Name: [name]. Date of Service: 06/09/2021  11:00 AM Medical Record Number: [id number] Patient Account Number: [id number] Date of Birth/Sex: [birth year] (66 y.o. M) Treating RN: Donnamarie Poag Primary Care Provider: Fulton Reek Other Clinician: Referring Provider: Fulton Reek Treating Provider/Extender: Skipper Cliche in Treatment: 2 Verbal / Phone Orders: No Diagnosis Coding ICD-10 Coding Code Description [phone] Pressure ulcer of left heel, stage 3 E11.621 Type 2 diabetes mellitus with foot ulcer L97.522 Non-pressure chronic ulcer of other part of left foot with fat layer exposed L89.893 Pressure ulcer of other site, stage 3 I87.2 Venous insufficiency (chronic) (peripheral) I50.42 Chronic combined systolic (congestive) and diastolic (congestive) heart failure I10 Essential (primary) hypertension Follow-up Appointments o Return Appointment in 1 week. o Nurse Visit as needed [place] for wound care. May utilize formulary equivalent dressing for wound treatment orders unless otherwise specified. Home Health Nurse may visit PRN to address patientos wound care needs. o Scheduled days for dressing changes to be completed; exception, patient has scheduled wound care visit that day. - HH to wrap twice weekly and at wound center one time unless he is unable to have appt at wound center that week o **Please direct any NON-WOUND related issues/requests for orders to patient's Primary Care Physician. **If current dressing causes regression in wound condition, may D/C ordered dressing product/s and apply Normal Saline Moist Dressing daily until next [place] or Other MD appointment. **Notify Wound Healing Center of regression in wound condition at [phone]. Bathing/ Shower/ Hygiene o May shower with wound dressing protected with water repellent cover or cast protector. o No tub bath. Anesthetic (Use 'Patient Medications' Section for  Anesthetic Order Entry) o Lidocaine applied to wound bed Edema Control - Lymphedema / Segmental Compressive Device /  Other o Optional: One layer of unna paste to top of compression wrap (to act as an anchor). o Elevate legs to the level of the heart and pump ankles as often as possible o Elevate leg(s) parallel to the floor when sitting. o DO YOUR BEST to sleep in the bed at night. DO NOT sleep in your recliner. Long hours of sitting in Wood recliner leads to swelling of the legs and/or potential wounds on your backside. Non-Wound Condition o Additional non-wound orders/instructions: - Triamcinolone mixed with lotion to bilateral lower legs gauze between toes to keep dry; bandaid to cover newly healed skin to toe Off-Loading o Open toe surgical shoe with peg assist. - L heel wedge shoe o Turn and reposition every 2 hours Additional Orders / Instructions o Follow Nutritious Diet and Increase Protein Intake - monitor blood sugar Wound Treatment Wound #11 - Lower Leg Wound Laterality: Right Cleanser: Normal Saline 3 x Per Week/15 Days Mable, Nigel Wood. ([id number]) Discharge Instructions: Wash your hands with soap and water. Remove old dressing, discard into plastic bag and place into trash. Cleanse the wound with Normal Saline prior to applying Wood clean dressing using gauze sponges, not tissues or cotton balls. Do not scrub or use excessive force. Pat dry using gauze sponges, not tissue or cotton balls. Cleanser: Soap and Water 3 x Per Week/15 Days Discharge Instructions: Gently cleanse wound with antibacterial soap, rinse and pat dry prior to dressing wounds Cleanser: Wound Cleanser 3 x Per Week/15 Days Discharge Instructions: Wash your hands with soap and water. Remove old dressing, discard into plastic bag and place into trash. Cleanse the wound with Wound Cleanser prior to applying Wood clean dressing using gauze sponges, not tissues or cotton balls. Do not scrub or use  excessive force. Pat dry using gauze sponges, not tissue or cotton balls. Topical: Triamcinolone Acetonide Cream, 0.1%, 15 (g) tube 3 x Per Week/15 Days Discharge Instructions: Mixed with half Eucerin or alternative-to bilateral lower legsApply as directed by provider. Topical: Eucerin lotion 3 x Per Week/15 Days Discharge Instructions: Mixed 1/2 with TCA to bilateral lower legs Primary Dressing: Iodosorb 40 (g) 3 x Per Week/15 Days Discharge Instructions: Apply IodoSorb to wound bed only as directed. Secondary Dressing: Zetuvit Plus Silicone Non-bordered 5x5 (in/in) 3 x Per Week/15 Days Compression Wrap: Profore Lite LF 3 Multilayer Compression Bandaging System 3 x Per Week/15 Days Discharge Instructions: Apply 3 multi-layer wrap as prescribed. Wound #7 - Calcaneus Wound Laterality: Left Cleanser: Normal Saline 3 x Per Week/15 Days Discharge Instructions: Wash your hands with soap and water. Remove old dressing, discard into plastic bag and place into trash. Cleanse the wound with Normal Saline prior to applying Wood clean dressing using gauze sponges, not tissues or cotton balls. Do not scrub or use excessive force. Pat dry using gauze sponges, not tissue or cotton balls. Cleanser: Soap and Water 3 x Per Week/15 Days Discharge Instructions: Gently cleanse wound with antibacterial soap, rinse and pat dry prior to dressing wounds Cleanser: Wound Cleanser 3 x Per Week/15 Days Discharge Instructions: Wash your hands with soap and water. Remove old dressing, discard into plastic bag and place into trash. Cleanse the wound with Wound Cleanser prior to applying Wood clean dressing using gauze sponges, not tissues or cotton balls. Do not scrub or use excessive force. Pat dry using gauze sponges, not tissue or cotton balls. Topical: Triamcinolone Acetonide Cream, 0.1%, 15 (g) tube 3 x Per Week/15 Days Discharge Instructions: Mixed with half Eucerin or alternative-to  bilateral lower legsApply as directed by  provider. Topical: Eucerin lotion 3 x Per Week/15 Days Discharge Instructions: Mixed 1/2 with TCA to bilateral lower legs Primary Dressing: Iodosorb 40 (g) 3 x Per Week/15 Days Discharge Instructions: Apply IodoSorb to wound bed only as directed. Secondary Dressing: Zetuvit Plus Silicone Non-bordered 5x5 (in/in) 3 x Per Week/15 Days Compression Wrap: Profore Lite LF 3 Multilayer Compression Bandaging System 3 x Per Week/15 Days Discharge Instructions: Apply 3 multi-layer wrap as prescribed. Wound #9 - Toe Great Wound Laterality: Right Cleanser: Normal Saline 3 x Per Week/15 Days Discharge Instructions: Wash your hands with soap and water. Remove old dressing, discard into plastic bag and place into trash. Cleanse the wound with Normal Saline prior to applying Wood clean dressing using gauze sponges, not tissues or cotton balls. Do not scrub or use excessive force. Pat dry using gauze sponges, not tissue or cotton balls. Cleanser: Soap and Water 3 x Per Week/15 Days Discharge Instructions: Gently cleanse wound with antibacterial soap, rinse and pat dry prior to dressing wounds Cleanser: Wound Cleanser 3 x Per Week/15 Days Discharge Instructions: Wash your hands with soap and water. Remove old dressing, discard into plastic bag and place into trash. Cleanse the wound with Wound Cleanser prior to applying Wood clean dressing using gauze sponges, not tissues or cotton balls. Do not scrub or use excessive force. Pat dry using gauze sponges, not tissue or cotton balls. Cieslinski, MANASES ETCHISON ([id number]) Topical: Triamcinolone Acetonide Cream, 0.1%, 15 (g) tube 3 x Per Week/15 Days Discharge Instructions: Mixed with half Eucerin or alternative-to bilateral lower legsApply as directed by provider. Topical: Eucerin lotion 3 x Per Week/15 Days Discharge Instructions: Mixed 1/2 with TCA to bilateral lower legs Primary Dressing: Iodosorb 40 (g) 3 x Per Week/15 Days Discharge Instructions: Apply IodoSorb to wound  bed only as directed. Secondary Dressing: Zetuvit Plus Silicone Non-bordered 5x5 (in/in) 3 x Per Week/15 Days Compression Wrap: Profore Lite LF 3 Multilayer Compression Bandaging System 3 x Per Week/15 Days Discharge Instructions: Apply 3 multi-layer wrap as prescribed. Electronic Signature(s) Signed: 06/19/2021 4:23:33 PM By: Donnamarie Poag Signed: 06/27/2021 4:38:22 PM By: Worthy Keeler PA-C Previous Signature: 06/09/2021 12:08:53 PM Version By: Donnamarie Poag Previous Signature: 06/09/2021 4:49:47 PM Version By: Worthy Keeler PA-C Entered By: Donnamarie Poag on 06/16/2021 14:12:21 Angerer, Nanine Means ([id number]) -------------------------------------------------------------------------------- Problem List Details Patient Name: [name], [name]. Date of Service: 06/09/2021 11:00 AM Medical Record Number: [id number] Patient Account Number: [id number] Date of Birth/Sex: Dec 27, 1955 (66 y.o. M) Treating RN: Carlene Coria Primary Care Provider: Fulton Reek Other Clinician: Referring Provider: Fulton Reek Treating Provider/Extender: Skipper Cliche in Treatment: 2 Active Problems ICD-10 Encounter Code Description Active Date MDM Diagnosis [phone] Pressure ulcer of left heel, stage 3 05/26/2021 No Yes E11.621 Type 2 diabetes mellitus with foot ulcer 05/26/2021 No Yes L97.522 Non-pressure chronic ulcer of other part of left foot with fat layer 05/26/2021 No Yes exposed L89.893 Pressure ulcer of other site, stage 3 05/26/2021 No Yes I87.2 Venous insufficiency (chronic) (peripheral) 05/26/2021 No Yes I50.42 Chronic combined systolic (congestive) and diastolic (congestive) heart 05/26/2021 No Yes failure I10 Essential (primary) hypertension 05/26/2021 No Yes Inactive Problems Resolved Problems Electronic Signature(s) Signed: 06/09/2021 11:03:15 AM By: Worthy Keeler PA-C Entered By: Worthy Keeler on 06/09/2021 11:03:15 Iafrate, Nanine Means  ([id number]) -------------------------------------------------------------------------------- Progress Note Details Patient Name: [name], [name]. Date of Service: 06/09/2021 11:00 AM Medical Record Number: [id number] Patient Account Number: [id number] Date of Birth/Sex: 1956/02/22 (66 y.o. M)  Treating RN: Carlene Coria Primary Care Provider: Fulton Reek Other Clinician: Referring Provider: Fulton Reek Treating Provider/Extender: Skipper Cliche in Treatment: 2 Subjective Chief Complaint Information obtained from Patient Bilateral LE Ulcers History of Present Illness (HPI) Admission 8/31 Mr. chapin arduini is Wood 66 year old male with Wood past medical history of uncontrolled insulin-dependent type 2 diabetes, CAD, osteomyelitis to the left third toe status post partial third ray amputation, and peripheral arterial disease that presents for Wood left plantar foot wound and bilateral lower extremity wounds. The left plantar foot wound has been present for at least 7 months. He has been following podiatry for this issue. He uses silver collagen to this area. He reports that in the past 1 to 2 months he has developed multiple scattered open wounds to his lower extremities bilaterally that have worsened over time. He states that they often develop as either blisters or he hits his leg against an object.He is currently using Betadine to these areas. He denies signs of infection. He has compression stockings however he cannot put these on very easily. Also he is on his feet most of the day. He states he does not have anyone to help him do the activities he needs to get done. Readmission: 05/26/2021 upon evaluation today patient presents for reevaluation here in the clinic concerning issues he is been having with his bilateral lower extremities, his right great toe, and left heel primarily. With that being said he has been in the hospital recently and his hemoglobin A1c on January 10 was 9.2.  With that being said he does have Wood lot of really thickened dry skin attached to his legs when he try to remove that today. He is in agreement with the plan. Nonetheless I do believe that based on what we are seeing he is been having Wood lot of issues with his congestive heart failure I do have him on fluid pills he does go to the bathroom quite Wood bit. Subsequently has been in [employer] but I do not think that is doing quite enough to help control his edema. He also needs something better than Santyl to put on the heel right now when he was in the hospital they actually cut Wood space open in the compression around his heel so they can open it up and actually apply the Santyl daily. I think he needs something better than that. Again the patient does have Wood history of diabetes mellitus type 2, chronic venous insufficiency, hypertension, and congestive heart failure. He also has signs of stage III lymphedema as well. 06/02/2021 upon evaluation today patient appears to be doing decently well in regard to his wounds. Fortunately I do not see any signs of active infection at this time which is great news. Fortunately I think that he is making excellent progress. Nonetheless I do think that we seem to be making some progress here. 06/09/2021 upon evaluation today patient appears to be doing well with regard to his legs all things considered unfortunately the left heel is not doing nearly as well as what I like to see. There is Wood lot of necrotic tissue this is very boggy. I think that it is going to require potentially much more debridement but again we really need to get the results of the arterial testing done and this is actually scheduled for the 15th. Objective Constitutional Well-nourished and well-hydrated in no acute distress. Vitals Time Taken: 11:00 AM, Height: 73 in, Weight: 371 lbs, BMI: 48.9, Temperature: 97.6 F,  Pulse: 106 bpm, Respiratory Rate: 18 breaths/min, Blood Pressure: 153/97  mmHg. Respiratory normal breathing without difficulty. Psychiatric this patient is able to make decisions and demonstrates good insight into disease process. Alert and Oriented x 3. pleasant and cooperative. General Notes: Upon inspection patient's wounds showed signs unfortunately being somewhat boggy in regard to the left heel as well as having Wood lot of necrotic tissue here. I think that he is going require some sharp debridement but again we are holding off on trying to get the results of his vascular study that should be the 15th of this month. Tinner, Adonys Wood. ([id number]) Integumentary (Hair, Skin) Wound #10 status is Open. Original cause of wound was Gradually Appeared. The date acquired was: 05/26/2021. The wound has been in treatment 2 weeks. The wound is located on the Right,Posterior Lower Leg. The wound measures 0.5cm length x 0.5cm width x 0.1cm depth; 0.196cm^2 area and 0.02cm^3 volume. There is Fat Layer (Subcutaneous Tissue) exposed. There is Wood medium amount of serosanguineous drainage noted. There is large (67-100%) red granulation within the wound bed. There is Wood small (1-33%) amount of necrotic tissue within the wound bed including Adherent Slough. Wound #10 status is Open. Original cause of wound was Gradually Appeared. The date acquired was: 05/26/2021. The wound is located on the Right,Posterior Lower Leg. The wound measures 0.5cm length x 0.5cm width x 0.1cm depth; 0.196cm^2 area and 0.02cm^3 volume. There is Fat Layer (Subcutaneous Tissue) exposed. There is no tunneling or undermining noted. There is Wood medium amount of serosanguineous drainage noted. There is large (67-100%) red granulation within the wound bed. There is Wood small (1-33%) amount of necrotic tissue within the wound bed including Adherent Slough. Wound #7 status is Open. Original cause of wound was Gradually Appeared. The date acquired was: 05/05/2021. The wound has been in treatment 2 weeks. The wound is  located on the Left Calcaneus. The wound measures 3cm length x 3.7cm width x 0.1cm depth; 8.718cm^2 area and 0.872cm^3 volume. There is Wood large amount of serosanguineous drainage noted. Wound #9 status is Open. Original cause of wound was Gradually Appeared. The date acquired was: 04/26/2021. The wound has been in treatment 2 weeks. The wound is located on the Right Toe Great. The wound measures 1.3cm length x 1cm width x 0.1cm depth; 1.021cm^2 area and 0.102cm^3 volume. There is Wood medium amount of serosanguineous drainage noted. Assessment Active Problems ICD-10 Pressure ulcer of left heel, stage 3 Type 2 diabetes mellitus with foot ulcer Non-pressure chronic ulcer of other part of left foot with fat layer exposed Pressure ulcer of other site, stage 3 Venous insufficiency (chronic) (peripheral) Chronic combined systolic (congestive) and diastolic (congestive) heart failure Essential (primary) hypertension Procedures Wound #10 Pre-procedure diagnosis of Wound #10 is Wood Venous Leg Ulcer located on the Right,Posterior Lower Leg . There was Wood Three Layer Compression Therapy Procedure by Donnamarie Poag, RN. Post procedure Diagnosis Wound #10: Same as Pre-Procedure Wound #7 Pre-procedure diagnosis of Wound #7 is Wood Pressure Ulcer located on the Left Calcaneus . There was Wood Three Layer Compression Therapy Procedure by Donnamarie Poag, RN. Post procedure Diagnosis Wound #7: Same as Pre-Procedure Plan Follow-up Appointments: Return Appointment in 1 week. Nurse Visit as needed Home Health: [place]: - [place] for wound care. May utilize formulary equivalent dressing for wound treatment orders unless otherwise specified. Home Health Nurse may visit PRN to address patient s wound care needs. Scheduled days for dressing changes to be completed; exception,  patient has scheduled wound care visit that day. - HH to wrap twice weekly and at wound center one time unless he is  unable to have appt at wound center that week **Please direct any NON-WOUND related issues/requests for orders to patient's Primary Care Physician. **If current dressing causes regression in wound condition, may D/C ordered dressing product/s and apply Normal Saline Moist Dressing daily until next [place] or Other MD appointment. **Notify Wound Healing Center of regression in wound condition at [phone]. Bathing/ Shower/ Hygiene: ODAS, OZER ([id number]) May shower with wound dressing protected with water repellent cover or cast protector. No tub bath. Anesthetic (Use 'Patient Medications' Section for Anesthetic Order Entry): Lidocaine applied to wound bed Edema Control - Lymphedema / Segmental Compressive Device / Other: Optional: One layer of unna paste to top of compression wrap (to act as an anchor). Elevate legs to the level of the heart and pump ankles as often as possible Elevate leg(s) parallel to the floor when sitting. DO YOUR BEST to sleep in the bed at night. DO NOT sleep in your recliner. Long hours of sitting in Wood recliner leads to swelling of the legs and/or potential wounds on your backside. Non-Wound Condition: Additional non-wound orders/instructions: - Triamcinolone mixed with lotion to bilateral lower legs gauze between toes to keep dry; bandaid to cover newly healed skin to toe Off-Loading: Open toe surgical shoe with peg assist. - L heel wedge shoe Turn and reposition every 2 hours Additional Orders / Instructions: Follow Nutritious Diet and Increase Protein Intake - monitor blood sugar WOUND #10: - Lower Leg Wound Laterality: Right, Posterior Cleanser: Normal Saline 3 x Per Week/15 Days Discharge Instructions: Wash your hands with soap and water. Remove old dressing, discard into plastic bag and place into trash. Cleanse the wound with Normal Saline prior to applying Wood clean dressing using gauze sponges, not tissues or cotton balls. Do not  scrub or use excessive force. Pat dry using gauze sponges, not tissue or cotton balls. Cleanser: Soap and Water 3 x Per Week/15 Days Discharge Instructions: Gently cleanse wound with antibacterial soap, rinse and pat dry prior to dressing wounds Cleanser: Wound Cleanser 3 x Per Week/15 Days Discharge Instructions: Wash your hands with soap and water. Remove old dressing, discard into plastic bag and place into trash. Cleanse the wound with Wound Cleanser prior to applying Wood clean dressing using gauze sponges, not tissues or cotton balls. Do not scrub or use excessive force. Pat dry using gauze sponges, not tissue or cotton balls. Topical: Triamcinolone Acetonide Cream, 0.1%, 15 (g) tube 3 x Per Week/15 Days Discharge Instructions: Mixed with half Eucerin or alternative-to bilateral lower legsApply as directed by provider. Topical: Eucerin lotion 3 x Per Week/15 Days Discharge Instructions: Mixed 1/2 with TCA to bilateral lower legs Primary Dressing: Iodosorb 40 (g) 3 x Per Week/15 Days Discharge Instructions: Apply IodoSorb to wound bed only as directed. Secondary Dressing: Zetuvit Plus Silicone Non-bordered 5x5 (in/in) 3 x Per Week/15 Days Compression Wrap: Profore Lite LF 3 Multilayer Compression Bandaging System 3 x Per Week/15 Days Discharge Instructions: Apply 3 multi-layer wrap as prescribed. WOUND #7: - Calcaneus Wound Laterality: Left Cleanser: Normal Saline 3 x Per Week/15 Days Discharge Instructions: Wash your hands with soap and water. Remove old dressing, discard into plastic bag and place into trash. Cleanse the wound with Normal Saline prior to applying Wood clean dressing using gauze sponges, not tissues or cotton balls. Do not scrub or use excessive force. Pat dry  using gauze sponges, not tissue or cotton balls. Cleanser: Soap and Water 3 x Per Week/15 Days Discharge Instructions: Gently cleanse wound with antibacterial soap, rinse and pat dry prior to dressing wounds Cleanser:  Wound Cleanser 3 x Per Week/15 Days Discharge Instructions: Wash your hands with soap and water. Remove old dressing, discard into plastic bag and place into trash. Cleanse the wound with Wound Cleanser prior to applying Wood clean dressing using gauze sponges, not tissues or cotton balls. Do not scrub or use excessive force. Pat dry using gauze sponges, not tissue or cotton balls. Topical: Triamcinolone Acetonide Cream, 0.1%, 15 (g) tube 3 x Per Week/15 Days Discharge Instructions: Mixed with half Eucerin or alternative-to bilateral lower legsApply as directed by provider. Topical: Eucerin lotion 3 x Per Week/15 Days Discharge Instructions: Mixed 1/2 with TCA to bilateral lower legs Primary Dressing: Iodosorb 40 (g) 3 x Per Week/15 Days Discharge Instructions: Apply IodoSorb to wound bed only as directed. Secondary Dressing: Zetuvit Plus Silicone Non-bordered 5x5 (in/in) 3 x Per Week/15 Days Compression Wrap: Profore Lite LF 3 Multilayer Compression Bandaging System 3 x Per Week/15 Days Discharge Instructions: Apply 3 multi-layer wrap as prescribed. WOUND #9: - Toe Great Wound Laterality: Right Cleanser: Normal Saline 3 x Per Week/15 Days Discharge Instructions: Wash your hands with soap and water. Remove old dressing, discard into plastic bag and place into trash. Cleanse the wound with Normal Saline prior to applying Wood clean dressing using gauze sponges, not tissues or cotton balls. Do not scrub or use excessive force. Pat dry using gauze sponges, not tissue or cotton balls. Cleanser: Soap and Water 3 x Per Week/15 Days Discharge Instructions: Gently cleanse wound with antibacterial soap, rinse and pat dry prior to dressing wounds Cleanser: Wound Cleanser 3 x Per Week/15 Days Discharge Instructions: Wash your hands with soap and water. Remove old dressing, discard into plastic bag and place into trash. Cleanse the wound with Wound Cleanser prior to applying Wood clean dressing using gauze  sponges, not tissues or cotton balls. Do not scrub or use excessive force. Pat dry using gauze sponges, not tissue or cotton balls. Topical: Triamcinolone Acetonide Cream, 0.1%, 15 (g) tube 3 x Per Week/15 Days Discharge Instructions: Mixed with half Eucerin or alternative-to bilateral lower legsApply as directed by provider. Topical: Eucerin lotion 3 x Per Week/15 Days Discharge Instructions: Mixed 1/2 with TCA to bilateral lower legs Primary Dressing: Iodosorb 40 (g) 3 x Per Week/15 Days Discharge Instructions: Apply IodoSorb to wound bed only as directed. Secondary Dressing: Zetuvit Plus Silicone Non-bordered 5x5 (in/in) 3 x Per Week/15 Days Compression Wrap: Profore Lite LF 3 Multilayer Compression Bandaging System 3 x Per Week/15 Days Discharge Instructions: Apply 3 multi-layer wrap as prescribed. Hartl, Deanthony Wood. ([id number]) 1. Would recommend for the time being that we continue with the wound care measures as before. We will get Wood be utilizing the Iodosorb followed by the 3 layer compression wrap. 2. I am also can recommend that we have the patient continue to monitor for any signs of worsening or infection if anything changes he should let me know. Right now I feel like we are on the right track but is just very slow. 3. I am also going to suggest patient should continue to elevate his legs and again we will see what the results of the arterial study with ABI and TBI show when he has this performed on 15 February. We will see patient back for reevaluation in 1 week here in the  clinic. If anything worsens or changes patient will contact our office for additional recommendations. Electronic Signature(s) Signed: 06/09/2021 1:15:03 PM By: Worthy Keeler PA-C Entered By: Worthy Keeler on 06/09/2021 13:15:03 Kalil, Nanine Means ([id number]) -------------------------------------------------------------------------------- SuperBill Details Patient Name: [name]. Date of  Service: 06/09/2021 Medical Record Number: [id number] Patient Account Number: [id number] Date of Birth/Sex: 29-Jul-1955 (66 y.o. M) Treating RN: Donnamarie Poag Primary Care Provider: Fulton Reek Other Clinician: Referring Provider: Fulton Reek Treating Provider/Extender: Skipper Cliche in Treatment: 2 Diagnosis Coding ICD-10 Codes Code Description [phone] Pressure ulcer of left heel, stage 3 E11.621 Type 2 diabetes mellitus with foot ulcer L97.522 Non-pressure chronic ulcer of other part of left foot with fat layer exposed L89.893 Pressure ulcer of other site, stage 3 I87.2 Venous insufficiency (chronic) (peripheral) I50.42 Chronic combined systolic (congestive) and diastolic (congestive) heart failure I10 Essential (primary) hypertension Facility Procedures CPT4: Description Modifier Quantity Code [id number] BILATERAL: Application of multi-layer venous compression system; leg (below knee), including 1 ankle and foot. Physician Procedures CPT4 Code: [id number] Description: 37858 - WC PHYS LEVEL 4 - EST PT Modifier: Quantity: 1 CPT4 Code: Description: ICD-10 Diagnosis Description L89.623 Pressure ulcer of left heel, stage 3 E11.621 Type 2 diabetes mellitus with foot ulcer L97.522 Non-pressure chronic ulcer of other part of left foot with fat layer ex L89.893 Pressure ulcer of other site,  stage 3 Modifier: posed Quantity: Electronic Signature(s) Signed: 06/09/2021 1:15:28 PM By: Worthy Keeler PA-C Previous Signature: 06/09/2021 12:08:53 PM Version By: Donnamarie Poag Entered By: Worthy Keeler on 06/09/2021 13:15:27

## 2021-06-09 NOTE — Progress Notes (Addendum)
SATCHEL, HEIDINGER ([id number]) Visit Report for 06/09/2021 Arrival Information Details Patient Name: DARLY, FAILS A. Date of Service: 06/09/2021 11:00 AM Medical Record Number: [id number] Patient Account Number: [id number] Date of Birth/Sex: [birth year] (66 y.o. M) Treating RN: Carlene Coria Primary Care Joely Losier: Fulton Reek Other Clinician: Referring Rayetta Veith: Fulton Reek Treating Krithika Tome/Extender: Skipper Cliche in Treatment: 2 Visit Information History Since Last Visit All ordered tests and consults were completed: No Patient Arrived: [name] Added or deleted any medications: No Arrival Time: 10:54 Any new allergies or adverse reactions: No Accompanied By: wife Had a fall or experienced change in No Transfer Assistance: None activities of daily living that may affect Patient Identification Verified: Yes risk of falls: Secondary Verification Process Completed: Yes Signs or symptoms of abuse/neglect since last visito No Patient Requires Transmission-Based No Hospitalized since last visit: No Precautions: Implantable device outside of the clinic excluding No Patient Has Alerts: Yes cellular tissue based products placed in the center Patient Alerts: Patient on Blood since last visit: Thinner Has Dressing in Place as Prescribed: Yes DIABETIC Has Compression in Place as Prescribed: Yes Eliquis/Aspirin 81 AVVS ABI 06/2020 Pain Present Now: No L 1.0; R 0.88 Electronic Signature(s) Signed: 06/09/2021 3:45:36 PM By: Carlene Coria RN Entered By: Carlene Coria on 06/09/2021 11:00:20 Parlett, Nanine Means ([id number]) -------------------------------------------------------------------------------- Compression Therapy Details Patient Name: Mednick, Cornelio A. Date of Service: 06/09/2021 11:00 AM Medical Record Number: [id number] Patient Account Number: [id number] Date of Birth/Sex: Jan 24, 1956 (66 y.o. M) Treating RN: Donnamarie Poag Primary Care Eleah Lahaie: Fulton Reek Other  Clinician: Referring Eran Windish: Fulton Reek Treating Aianna Fahs/Extender: Skipper Cliche in Treatment: 2 Compression Therapy Performed for Wound Assessment: Wound #10 Right,Posterior Lower Leg Performed By: Clinician Donnamarie Poag, RN Compression Type: Three Layer Post Procedure Diagnosis Same as Pre-procedure Electronic Signature(s) Signed: 06/09/2021 12:08:53 PM By: Donnamarie Poag Entered By: Donnamarie Poag on 06/09/2021 11:42:46 Gortney, Nanine Means ([id number]) -------------------------------------------------------------------------------- Compression Therapy Details Patient Name: Topper, Chuong A. Date of Service: 06/09/2021 11:00 AM Medical Record Number: [id number] Patient Account Number: [id number] Date of Birth/Sex: June 15, 1955 (66 y.o. M) Treating RN: Donnamarie Poag Primary Care Sera Hitsman: Fulton Reek Other Clinician: Referring Dani Wallner: Fulton Reek Treating Colt Martelle/Extender: Skipper Cliche in Treatment: 2 Compression Therapy Performed for Wound Assessment: Wound #7 Left Calcaneus Performed By: Clinician Donnamarie Poag, RN Compression Type: Three Layer Post Procedure Diagnosis Same as Pre-procedure Electronic Signature(s) Signed: 06/09/2021 12:08:53 PM By: Donnamarie Poag Entered By: Donnamarie Poag on 06/09/2021 11:42:46 Fiallo, Nanine Means ([id number]) -------------------------------------------------------------------------------- Encounter Discharge Information Details Patient Name: [name] Date of Service: 06/09/2021 11:00 AM Medical Record Number: [id number] Patient Account Number: [id number] Date of Birth/Sex: 1955/09/18 (66 y.o. M) Treating RN: Donnamarie Poag Primary Care Maclane Holloran: Fulton Reek Other Clinician: Referring Davaun Quintela: Fulton Reek Treating Jesusa Stenerson/Extender: Skipper Cliche in Treatment: 2 Encounter Discharge Information Items Discharge Condition: Stable Ambulatory Status: Walker Discharge Destination: Home Transportation: Private  Auto Accompanied By: wife Schedule Follow-up Appointment: Yes Clinical Summary of Care: Electronic Signature(s) Signed: 06/09/2021 12:08:53 PM By: Donnamarie Poag Entered By: Donnamarie Poag on 06/09/2021 11:46:45 Bashore, Nanine Means ([id number]) -------------------------------------------------------------------------------- Lower Extremity Assessment Details Patient Name: Kasik, Jakwon A. Date of Service: 06/09/2021 11:00 AM Medical Record Number: [id number] Patient Account Number: [id number] Date of Birth/Sex: July 17, 1955 (66 y.o. M) Treating RN: Carlene Coria Primary Care Saulo Anthis: Fulton Reek Other Clinician: Referring Keefe Zawistowski: Fulton Reek Treating Jaquanda Wickersham/Extender: Skipper Cliche in Treatment: 2 Edema Assessment Assessed: [Left: Yes] [Right: Yes] Edema: [Left: Yes] [Right: Yes] Calf Left: Right: Point of Measurement: 36 cm From  Medial Instep 56 cm 54.5 cm Ankle Left: Right: Point of Measurement: 12 cm From Medial Instep 30.5 cm 29 cm Knee To Floor Left: Right: From Medial Instep 47 cm 47 cm Vascular Assessment Pulses: Dorsalis Pedis Palpable: [Left:Yes] [Right:Yes] Electronic Signature(s) Signed: 06/09/2021 3:45:36 PM By: Carlene Coria RN Entered By: Carlene Coria on 06/09/2021 11:23:03 Gilpatrick, Nanine Means ([id number]) -------------------------------------------------------------------------------- Multi Wound Chart Details Patient Name: Schroepfer, Lynx A. Date of Service: 06/09/2021 11:00 AM Medical Record Number: [id number] Patient Account Number: [id number] Date of Birth/Sex: 05-10-1955 (66 y.o. M) Treating RN: Donnamarie Poag Primary Care Karuna Balducci: Fulton Reek Other Clinician: Referring Ayza Ripoll: Fulton Reek Treating Reiley Bertagnolli/Extender: Skipper Cliche in Treatment: 2 Vital Signs Height(in): 71 Pulse(bpm): 106 Weight(lbs): 371 Blood Pressure(mmHg): 153/97 Body Mass Index(BMI): 48.9 Temperature(F): 97.6 Respiratory Rate(breaths/min): 48 Photos:  [10:No Photos] [7:No Photos No Photos] Wound Location: [10:Right, Posterior Lower Leg] [7:Right, Posterior Lower Leg Left Calcaneus] Wounding Event: [10:Gradually Appeared] [7:Gradually Appeared Gradually Appeared] Primary Etiology: [10:Venous Leg Ulcer] [7:Venous Leg Ulcer Pressure Ulcer] Secondary Etiology: [10:[name]] [7:[name] [name]] Comorbid History: [10:Lymphedema, Asthma, Chronic Obstructive Pulmonary Disease (COPD), Sleep Apnea, Arrhythmia, Congestive Heart Failure, Coronary Artery Disease, Hypertension, Myocardial Infarction, Peripheral Venous Disease, Colitis, Type II Diabetes,  History of pressure wounds, Gout, Osteoarthritis] [7:Lymphedema, Asthma, Chronic [name] Obstructive Pulmonary Disease (COPD), Sleep Apnea, Arrhythmia, Congestive Heart Failure, Coronary Artery Disease, Hypertension, Myocardial Infarction, Peripheral Venous  Disease, Colitis, Type II Diabetes, History of pressure wounds, Gout, Osteoarthritis] Date Acquired: [10:05/26/2021] [7:05/26/2021 05/05/2021] Weeks of Treatment: [10:2] [7:0 2] Wound Status: [10:Open] [7:Open Open] Wound Recurrence: [10:No] [7:No No] Pending Amputation on [10:No] [7:No Yes] Presentation: Measurements L x W x D (cm) [10:0.5x0.5x0.1] [7:0.5x0.5x0.1 3x3.7x0.1] Area (cm) : [10:0.196] [7:0.196 8.718] Volume (cm) : [10:0.02] [7:0.02 0.872] % Reduction in Area: [10:68.80%] [7:0.00% -52.90%] % Reduction in Volume: [10:68.30%] [7:0.00% 84.70%] Classification: [10:Full Thickness Without Exposed Support Structures] [7:Full Thickness Without Exposed Category/Stage III Support Structures] Exudate Amount: [10:Medium] [7:Medium Large] Exudate Type: [10:Serosanguineous] [7:Serosanguineous Serosanguineous] Exudate Color: [10:red, brown] [7:red, brown red, brown] Granulation Amount: [10:Large (67-100%)] [7:Large (67-100%) [name]] Granulation Quality: [10:Red] [7:Red [name]] Necrotic Amount: [10:Small (1-33%)] [7:Small (1-33%) [name]] Exposed Structures: [10:Fat Layer  (Subcutaneous Tissue): Yes Fascia: No Tendon: No Muscle: No Joint: No Bone: No [name]] [7:Fat Layer (Subcutaneous Tissue): [name] Yes Fascia: No Tendon: No Muscle: No Joint: No Bone: No Large (67-100%) [name]] Wound Number: 9 [name] [name] Photos: No Photos [name] [name] Wound Location: Right Toe Great [name] [name] Wounding Event: Gradually Appeared [name] [name] Primary Etiology: Pressure Ulcer [name] [name] Secondary Etiology: Diabetic Wound/Ulcer of the Lower [name] [name] Extremity Comorbid History: [name] [name] [name] Date Acquired: 04/26/2021 [name] [name] Weeks of Treatment: 2 [name] [name] Wound Status: Open [name] [name] Zebrowski, Bunnie A. ([id number]) Wound Recurrence: No [name] [name] Pending Amputation on Yes [name] [name] Presentation: Measurements L x W x D (cm) 1.3x1x0.1 [name] [name] Area (cm) : 1.021 [name] [name] Volume (cm) : 0.102 [name] [name] % Reduction in Area: -62.60% [name] [name] % Reduction in Volume: -61.90% [name] [name] Classification: Category/Stage III [name] [name] Exudate Amount: Medium [name] [name] Exudate Type: Serosanguineous [name] [name] Exudate Color: red, brown [name] [name] Granulation Amount: [name] [name] [name] Granulation Quality: [name] [name] [name] Necrotic Amount: [name] [name] [name] Exposed Structures: [name] [name] [name] Epithelialization: [name] [name] [name] Treatment Notes Electronic Signature(s) Signed: 06/09/2021 12:08:53 PM By: Donnamarie Poag Entered By: Donnamarie Poag on 06/09/2021 11:41:16 Sarabia, Nanine Means ([id number]) -------------------------------------------------------------------------------- Multi-Disciplinary Care Plan Details Patient Name: [name] Date of Service: 06/09/2021 11:00 AM Medical Record Number:  [id number] Patient Account Number: [id number] Date of Birth/Sex: Sep 23, 1955 (66 y.o. M) Treating RN: Carlene Coria Primary Care Vicenta Olds: Fulton Reek Other Clinician: Referring Dreonna Hussein: Fulton Reek Treating Mirabelle Cyphers/Extender: Skipper Cliche in Treatment: 2 Active Inactive Wound/Skin Impairment Nursing Diagnoses: Impaired tissue integrity Knowledge  deficit related to smoking impact on wound healing Knowledge deficit related to ulceration/compromised skin integrity Goals: Patient will have a decrease in wound volume by X% from date: (specify in notes) Date Initiated: 05/26/2021 Target Resolution Date: 06/23/2021 Goal Status: Active Patient/caregiver will verbalize understanding of skin care regimen Date Initiated: 05/26/2021 Date Inactivated: 06/02/2021 Target Resolution Date: 06/09/2021 Goal Status: Met Interventions: Assess patient/caregiver ability to obtain necessary supplies Assess patient/caregiver ability to perform ulcer/skin care regimen upon admission and as needed Notes: Electronic Signature(s) Signed: 06/09/2021 12:08:53 PM By: Donnamarie Poag Signed: 06/09/2021 3:45:36 PM By: Carlene Coria RN Entered By: Donnamarie Poag on 06/09/2021 11:42:17 Petruzzi, Nanine Means ([id number]) -------------------------------------------------------------------------------- Pain Assessment Details Patient Name: [name] Date of Service: 06/09/2021 11:00 AM Medical Record Number: [id number] Patient Account Number: [id number] Date of Birth/Sex: 28-Jun-1955 (66 y.o. M) Treating RN: Carlene Coria Primary Care Mariapaula Krist: Fulton Reek Other Clinician: Referring Lalla Laham: Fulton Reek Treating Icela Glymph/Extender: Skipper Cliche in Treatment: 2 Active Problems Location of Pain Severity and Description of Pain Patient Has Paino No Site Locations Pain Management and Medication Current Pain Management: Electronic Signature(s) Signed: 06/09/2021 3:45:36 PM By: Carlene Coria RN Entered By: Carlene Coria on 06/09/2021 11:00:54 Blanks, Nanine Means ([id number]) -------------------------------------------------------------------------------- Patient/Caregiver Education Details Patient Name: [name] Date of Service: 06/09/2021 11:00 AM Medical Record Number: [id number] Patient Account Number: [id number] Date of Birth/Gender: 03-04-1956  (66 y.o. M) Treating RN: Donnamarie Poag Primary Care Physician: Fulton Reek Other Clinician: Referring Physician: Fulton Reek Treating Physician/Extender: Skipper Cliche in Treatment: 2 Education Assessment Education Provided To: Patient and Caregiver Education Topics Provided Basic Hygiene: Venous: Wound/Skin Impairment: Electronic Signature(s) Signed: 06/09/2021 12:08:53 PM By: Donnamarie Poag Entered By: Donnamarie Poag on 06/09/2021 11:45:06 Scally, Nanine Means ([id number]) -------------------------------------------------------------------------------- Wound Assessment Details Patient Name: Suydam, Aziel A. Date of Service: 06/09/2021 11:00 AM Medical Record Number: [id number] Patient Account Number: [id number] Date of Birth/Sex: [birth year] (66 y.o. M) Treating RN: Carlene Coria Primary Care Teshara Moree: Fulton Reek Other Clinician: Referring Ashonte Angelucci: Fulton Reek Treating Casey Fye/Extender: Skipper Cliche in Treatment: 2 Wound Status Wound Number: 10 Primary Venous Leg Ulcer Etiology: Wound Location: Right, Posterior Lower Leg Wound Open Wounding Event: Gradually Appeared Status: Date Acquired: 05/26/2021 Comorbid Lymphedema, Asthma, Chronic Obstructive Pulmonary Weeks Of Treatment: 0 History: Disease (COPD), Sleep Apnea, Arrhythmia, Congestive Clustered Wound: No Heart Failure, Coronary Artery Disease, Hypertension, Myocardial Infarction, Peripheral Venous Disease, Colitis, Type II Diabetes, History of pressure wounds, Gout, Osteoarthritis Wound Measurements Length: (cm) 0.5 Width: (cm) 0.5 Depth: (cm) 0.1 Area: (cm) 0.196 Volume: (cm) 0.02 % Reduction in Area: 0% % Reduction in Volume: 0% Epithelialization: Large (67-100%) Tunneling: No Undermining: No Wound Description Classification: Full Thickness Without Exposed Support Structures Exudate Amount: Medium Exudate Type: Serosanguineous Exudate Color: red, brown Foul Odor After Cleansing:  No Slough/Fibrino Yes Wound Bed Granulation Amount: Large (67-100%) Exposed Structure Granulation Quality: Red Fascia Exposed: No Necrotic Amount: Small (1-33%) Fat Layer (Subcutaneous Tissue) Exposed: Yes Necrotic Quality: Adherent Slough Tendon Exposed: No Muscle Exposed: No Joint Exposed: No Bone Exposed: No Electronic Signature(s) Signed: 06/09/2021 11:21:56 AM By: Carlene Coria RN Entered By: Carlene Coria on 06/09/2021 11:21:55 Raphael, Nanine Means ([id number]) -------------------------------------------------------------------------------- Wound Assessment Details Patient Name: Arp, Corran A. Date of Service: 06/09/2021  11:00 AM Medical Record Number: [id number] Patient Account Number: [id number] Date of Birth/Sex: 25-Jul-1955 (66 y.o. M) Treating RN: Donnamarie Poag Primary Care Sharilynn Cassity: Fulton Reek Other Clinician: Referring Evangelina Delancey: Fulton Reek Treating Shaunee Mulkern/Extender: Skipper Cliche in Treatment: 2 Wound Status Wound Number: 7 Primary Etiology: Pressure Ulcer Wound Location: Left Calcaneus Wound Status: Open Wounding Event: Gradually Appeared Date Acquired: 05/05/2021 Weeks Of Treatment: 2 Clustered Wound: No Pending Amputation On Presentation Wound Measurements Length: (cm) 3 Width: (cm) 3.7 Depth: (cm) 1 Area: (cm) 8.718 Volume: (cm) 8.718 % Reduction in Area: -52.9% % Reduction in Volume: -52.9% Wound Description Classification: Category/Stage III Exudate Amount: Large Exudate Type: Serosanguineous Exudate Color: red, brown Treatment Notes Wound #7 (Calcaneus) Wound Laterality: Left Cleanser Normal Saline Discharge Instruction: Wash your hands with soap and water. Remove old dressing, discard into plastic bag and place into trash. Cleanse the wound with Normal Saline prior to applying a clean dressing using gauze sponges, not tissues or cotton balls. Do not scrub or use excessive force. Pat dry using gauze sponges, not tissue or cotton  balls. Soap and Water Discharge Instruction: Gently cleanse wound with antibacterial soap, rinse and pat dry prior to dressing wounds Wound Cleanser Discharge Instruction: Wash your hands with soap and water. Remove old dressing, discard into plastic bag and place into trash. Cleanse the wound with Wound Cleanser prior to applying a clean dressing using gauze sponges, not tissues or cotton balls. Do not scrub or use excessive force. Pat dry using gauze sponges, not tissue or cotton balls. Peri-Wound Care Topical Triamcinolone Acetonide Cream, 0.1%, 15 (g) tube Discharge Instruction: Mixed with half Eucerin or alternative-to bilateral lower legsApply as directed by Ibrohim Simmers. Eucerin lotion Discharge Instruction: Mixed 1/2 with TCA to bilateral lower legs Primary Dressing Iodosorb 40 (g) Discharge Instruction: Apply IodoSorb to wound bed only as directed. Secondary Dressing Zetuvit Plus Silicone Non-bordered 5x5 (in/in) Secured With Triggs, Hadden A. ([id number]) Compression Wrap Profore Lite LF 3 Multilayer Compression Bandaging System Discharge Instruction: Apply 3 multi-layer wrap as prescribed. Compression Stockings Add-Ons Electronic Signature(s) Signed: 06/19/2021 4:23:33 PM By: Donnamarie Poag Previous Signature: 06/09/2021 3:45:36 PM Version By: Carlene Coria RN Entered By: Donnamarie Poag on 06/16/2021 13:20:08 Roscher, Nanine Means ([id number]) -------------------------------------------------------------------------------- Wound Assessment Details Patient Name: Pokorny, Kayle A. Date of Service: 06/09/2021 11:00 AM Medical Record Number: [id number] Patient Account Number: [id number] Date of Birth/Sex: December 05, 1955 (66 y.o. M) Treating RN: Carlene Coria Primary Care Brenlyn Beshara: Fulton Reek Other Clinician: Referring Lemon Sternberg: Fulton Reek Treating Adolphe Fortunato/Extender: Skipper Cliche in Treatment: 2 Wound Status Wound Number: 9 Primary Etiology: Pressure Ulcer Wound Location:  Right Toe Great Secondary Etiology: Diabetic Wound/Ulcer of the Lower Extremity Wounding Event: Gradually Appeared Wound Status: Open Date Acquired: 04/26/2021 Weeks Of Treatment: 2 Clustered Wound: No Pending Amputation On Presentation Wound Measurements Length: (cm) 1.3 Width: (cm) 1 Depth: (cm) 0.1 Area: (cm) 1.021 Volume: (cm) 0.102 % Reduction in Area: -62.6% % Reduction in Volume: -61.9% Wound Description Classification: Category/Stage III Exudate Amount: Medium Exudate Type: Serosanguineous Exudate Color: red, brown Electronic Signature(s) Signed: 06/09/2021 3:45:36 PM By: Carlene Coria RN Entered By: Carlene Coria on 06/09/2021 11:16:20 Fry, Nanine Means ([id number]) -------------------------------------------------------------------------------- Vitals Details Patient Name: [name] Date of Service: 06/09/2021 11:00 AM Medical Record Number: [id number] Patient Account Number: [id number] Date of Birth/Sex: December 09, 1955 (66 y.o. M) Treating RN: Carlene Coria Primary Care Abisola Carrero: Fulton Reek Other Clinician: Referring Jordin Dambrosio: Fulton Reek Treating Chanice Brenton/Extender: Skipper Cliche in Treatment: 2 Vital Signs Time Taken: 11:00 Temperature (F): 97.6  Height (in): 73 Pulse (bpm): 106 Weight (lbs): 371 Respiratory Rate (breaths/min): 18 Body Mass Index (BMI): 48.9 Blood Pressure (mmHg): 153/97 Reference Range: 80 - 120 mg / dl Electronic Signature(s) Signed: 06/09/2021 3:45:36 PM By: Carlene Coria RN Entered By: Carlene Coria on 06/09/2021 11:00:43

## 2021-06-14 ENCOUNTER — Other Ambulatory Visit (INDEPENDENT_AMBULATORY_CARE_PROVIDER_SITE_OTHER): Payer: Self-pay | Admitting: Physician Assistant

## 2021-06-14 ENCOUNTER — Other Ambulatory Visit (INDEPENDENT_AMBULATORY_CARE_PROVIDER_SITE_OTHER): Payer: Medicare Other

## 2021-06-14 ENCOUNTER — Other Ambulatory Visit: Payer: Self-pay

## 2021-06-14 DIAGNOSIS — I739 Peripheral vascular disease, unspecified: Secondary | ICD-10-CM

## 2021-06-14 DIAGNOSIS — L97429 Non-pressure chronic ulcer of left heel and midfoot with unspecified severity: Secondary | ICD-10-CM | POA: Diagnosis not present

## 2021-06-16 ENCOUNTER — Other Ambulatory Visit: Payer: Self-pay

## 2021-06-16 ENCOUNTER — Encounter: Payer: Medicare Other | Admitting: Internal Medicine

## 2021-06-16 ENCOUNTER — Other Ambulatory Visit
Admission: RE | Admit: 2021-06-16 | Discharge: 2021-06-16 | Disposition: A | Discharge status: Home / Self Care | Payer: Medicare Other | Source: Ambulatory Visit | Attending: Internal Medicine | Admitting: Internal Medicine

## 2021-06-16 DIAGNOSIS — L03116 Cellulitis of left lower limb: Secondary | ICD-10-CM | POA: Insufficient documentation

## 2021-06-16 DIAGNOSIS — L97818 Non-pressure chronic ulcer of other part of right lower leg with other specified severity: Secondary | ICD-10-CM | POA: Diagnosis not present

## 2021-06-16 DIAGNOSIS — L97522 Non-pressure chronic ulcer of other part of left foot with fat layer exposed: Secondary | ICD-10-CM | POA: Diagnosis not present

## 2021-06-16 DIAGNOSIS — L89893 Pressure ulcer of other site, stage 3: Secondary | ICD-10-CM | POA: Diagnosis not present

## 2021-06-16 DIAGNOSIS — I872 Venous insufficiency (chronic) (peripheral): Secondary | ICD-10-CM | POA: Diagnosis not present

## 2021-06-16 DIAGNOSIS — I110 Hypertensive heart disease with heart failure: Secondary | ICD-10-CM | POA: Diagnosis not present

## 2021-06-16 DIAGNOSIS — B999 Unspecified infectious disease: Secondary | ICD-10-CM | POA: Insufficient documentation

## 2021-06-16 DIAGNOSIS — L89623 Pressure ulcer of left heel, stage 3: Secondary | ICD-10-CM | POA: Diagnosis not present

## 2021-06-16 DIAGNOSIS — I5042 Chronic combined systolic (congestive) and diastolic (congestive) heart failure: Secondary | ICD-10-CM | POA: Diagnosis not present

## 2021-06-16 DIAGNOSIS — M1990 Unspecified osteoarthritis, unspecified site: Secondary | ICD-10-CM | POA: Diagnosis not present

## 2021-06-16 DIAGNOSIS — I890 Lymphedema, not elsewhere classified: Secondary | ICD-10-CM | POA: Diagnosis not present

## 2021-06-16 DIAGNOSIS — Z794 Long term (current) use of insulin: Secondary | ICD-10-CM | POA: Diagnosis not present

## 2021-06-16 DIAGNOSIS — E11621 Type 2 diabetes mellitus with foot ulcer: Secondary | ICD-10-CM | POA: Diagnosis not present

## 2021-06-16 NOTE — Progress Notes (Signed)
EEAN, BUSS ([id number]) Visit Report for 06/16/2021 Arrival Information Details Patient Name: [name], [name]. Date of Service: 06/16/2021 1:00 PM Medical Record Number: [id number] Patient Account Number: [id number] Date of Birth/Sex: 11-08-1955 (66 y.o. M) Treating RN: Donnamarie Poag Primary Care Antonella Upson: Fulton Reek Other Clinician: Referring Jennet Scroggin: Fulton Reek Treating Riona Lahti/Extender: Tito Dine in Treatment: 3 Visit Information History Since Last Visit Added or deleted any medications: No Patient Arrived: [name] Had Wood fall or experienced change in No Arrival Time: 13:02 activities of daily living that may affect Accompanied By: wife risk of falls: Transfer Assistance: None Hospitalized since last visit: No Patient Identification Verified: Yes Has Dressing in Place as Prescribed: Yes Secondary Verification Process Completed: Yes Has Compression in Place as Prescribed: Yes Patient Requires Transmission-Based No Pain Present Now: Yes Precautions: Patient Has Alerts: Yes Patient Alerts: Patient on Blood Thinner DIABETIC Eliquis/Aspirin 81 [facility] ABI 06/2020 L 1.0; R 0.88 ABI at [facility] 06/14/21 Electronic Signature(s) Signed: 06/16/2021 3:50:58 PM By: Donnamarie Poag Entered By: Donnamarie Poag on 06/16/2021 14:13:42 [name], [name] ([id number]) -------------------------------------------------------------------------------- Compression Therapy Details Patient Name: [name], [name]. Date of Service: 06/16/2021 1:00 PM Medical Record Number: [id number] Patient Account Number: [id number] Date of Birth/Sex: 09-Nov-1955 (66 y.o. M) Treating RN: Donnamarie Poag Primary Care Nkenge Sonntag: Fulton Reek Other Clinician: Referring Asucena Galer: Fulton Reek Treating Diyana Starrett/Extender: Tito Dine in Treatment: 3 Compression Therapy Performed for Wound Assessment: Wound #7 Left Calcaneus Performed By: Clinician Donnamarie Poag, RN Compression Type:  Three Layer Post Procedure Diagnosis Same as Pre-procedure Electronic Signature(s) Signed: 06/16/2021 3:50:58 PM By: Donnamarie Poag Entered By: Donnamarie Poag on 06/16/2021 13:26:11 [name], [name] ([id number]) -------------------------------------------------------------------------------- Compression Therapy Details Patient Name: [name], [name]. Date of Service: 06/16/2021 1:00 PM Medical Record Number: [id number] Patient Account Number: [id number] Date of Birth/Sex: 09-22-1955 (66 y.o. M) Treating RN: Donnamarie Poag Primary Care Sura Canul: Fulton Reek Other Clinician: Referring Orhan Mayorga: Fulton Reek Treating Tyric Rodeheaver/Extender: Tito Dine in Treatment: 3 Compression Therapy Performed for Wound Assessment: Wound #9 Right Toe Great Performed By: Junius Argyle, RN Compression Type: Three Layer Post Procedure Diagnosis Same as Pre-procedure Electronic Signature(s) Signed: 06/16/2021 3:50:58 PM By: Donnamarie Poag Entered By: Donnamarie Poag on 06/16/2021 13:26:11 [name], [name] ([id number]) -------------------------------------------------------------------------------- Encounter Discharge Information Details Patient Name: [name]. Date of Service: 06/16/2021 1:00 PM Medical Record Number: [id number] Patient Account Number: [id number] Date of Birth/Sex: 1956-03-23 (66 y.o. M) Treating RN: Donnamarie Poag Primary Care Zauria Dombek: Fulton Reek Other Clinician: Referring Sybol Morre: Fulton Reek Treating Lyza Houseworth/Extender: Tito Dine in Treatment: 3 Encounter Discharge Information Items Post Procedure Vitals Discharge Condition: Stable Temperature (F): 97.9 Ambulatory Status: Walker Pulse (bpm): 102 Discharge Destination: Home Respiratory Rate (breaths/min): 20 Transportation: Private Auto Blood Pressure (mmHg): 149/93 Accompanied By: wife Schedule Follow-up Appointment: Yes Clinical Summary of Care: Electronic Signature(s) Signed:  06/16/2021 3:50:58 PM By: Donnamarie Poag Entered By: Donnamarie Poag on 06/16/2021 14:08:54 [name], [name] ([id number]) -------------------------------------------------------------------------------- Lower Extremity Assessment Details Patient Name: [name], [name]. Date of Service: 06/16/2021 1:00 PM Medical Record Number: [id number] Patient Account Number: [id number] Date of Birth/Sex: 22-Dec-1955 (66 y.o. M) Treating RN: Donnamarie Poag Primary Care Ryder Man: Fulton Reek Other Clinician: Referring Poet Hineman: Fulton Reek Treating Fernandez Kenley/Extender: Tito Dine in Treatment: 3 Edema Assessment Assessed: [Left: Yes] [Right: Yes] Edema: [Left: Yes] [Right: Yes] Calf Left: Right: Point of Measurement: 36 cm From Medial Instep 55 cm 55 cm Ankle Left: Right: Point of Measurement: 12 cm From Medial Instep 33  cm 29 cm Knee To Floor Left: Right: From Medial Instep 47 cm 47 cm Vascular Assessment Pulses: Dorsalis Pedis Palpable: [Left:Yes] [Right:Yes] Electronic Signature(s) Signed: 06/16/2021 3:50:58 PM By: Donnamarie Poag Entered By: Donnamarie Poag on 06/16/2021 13:25:31 [name], [name] ([id number]) -------------------------------------------------------------------------------- Multi Wound Chart Details Patient Name: [name], [name]. Date of Service: 06/16/2021 1:00 PM Medical Record Number: [id number] Patient Account Number: [id number] Date of Birth/Sex: [birth year] (66 y.o. M) Treating RN: Donnamarie Poag Primary Care Aurelius Gildersleeve: Fulton Reek Other Clinician: Referring Clayten Allcock: Fulton Reek Treating Valisha Heslin/Extender: Tito Dine in Treatment: 3 Vital Signs Height(in): 78 Pulse(bpm): 102 Weight(lbs): 371 Blood Pressure(mmHg): 149/93 Body Mass Index(BMI): 48.9 Temperature(F): 97.9 Respiratory Rate(breaths/min): 20 Photos: Wound Location: Right, Posterior Lower Leg Left Calcaneus Right Toe Great Wounding Event: Gradually Appeared Gradually  Appeared Gradually Appeared Primary Etiology: Venous Leg Ulcer Pressure Ulcer Pressure Ulcer Secondary Etiology: [name] [name] Diabetic Wound/Ulcer of the Lower Extremity Comorbid History: Lymphedema, Asthma, Chronic Lymphedema, Asthma, Chronic Lymphedema, Asthma, Chronic Obstructive Pulmonary Disease Obstructive Pulmonary Disease Obstructive Pulmonary Disease (COPD), Sleep Apnea, Arrhythmia, (COPD), Sleep Apnea, Arrhythmia, (COPD), Sleep Apnea, Arrhythmia, Congestive Heart Failure, Coronary Congestive Heart Failure, Coronary Congestive Heart Failure, Coronary Artery Disease, Hypertension, Artery Disease, Hypertension, Artery Disease, Hypertension, Myocardial Infarction, Peripheral Myocardial Infarction, Peripheral Myocardial Infarction, Peripheral Venous Disease, Colitis, Type II Venous Disease, Colitis, Type II Venous Disease, Colitis, Type II Diabetes, History of pressure Diabetes, History of pressure Diabetes, History of pressure wounds, Gout, Osteoarthritis wounds, Gout, Osteoarthritis wounds, Gout, Osteoarthritis Date Acquired: 05/26/2021 05/05/2021 04/26/2021 Weeks of Treatment: _0 Wound Status: Healed - Epithelialized Open Open Wound Recurrence: No No No Pending Amputation on No Yes Yes Presentation: Measurements L x W x D (cm) 0x0x0 3x3.6x1.5 1x1x0.1 Area (cm) : 0 8.482 0.785 Volume (cm) : 0 12.723 0.079 % Reduction in Area: 100.00% -48.80% -25.00% % Reduction in Volume: 100.00% -123.10% -25.40% Starting Position 1 (o'clock): 10 Ending Position 1 (o'clock): 3 Maximum Distance 1 (cm): 0.5 Undermining: [name] Yes No Classification: Full Thickness Without Exposed Category/Stage III Category/Stage III Support Structures Exudate Amount: None Present Large Medium Exudate Type: [name] Serosanguineous Serosanguineous Exudate Color: [name] red, brown red, brown Wound Margin: [name] Thickened [name] Granulation Amount: None Present (0%) Small (1-33%) Large (67-100%) Granulation Quality: [name] Pink Red,  Pink Necrotic Amount: None Present (0%) Large (67-100%) Small (1-33%) Necrotic Tissue: [name] Eschar, Adherent [place] Exposed Structures: Fascia: No Fat Layer (Subcutaneous Tissue): Fat Layer (Subcutaneous Tissue): Fat Layer (Subcutaneous Tissue): Yes Yes No Fascia: No Fascia: No Tendon: No Tendon: No Tendon: No Linhardt, Garth Wood. ([id number]) Muscle: No Muscle: No Muscle: No Joint: No Joint: No Joint: No Bone: No Bone: No Bone: No Epithelialization: Large (67-100%) [name] [name] Treatment Notes Electronic Signature(s) Signed: 06/16/2021 3:50:58 PM By: Donnamarie Poag Entered By: Donnamarie Poag on 06/16/2021 13:25:50 [name], [name] ([id number]) -------------------------------------------------------------------------------- Multi-Disciplinary Care Plan Details Patient Name: [name]. Date of Service: 06/16/2021 1:00 PM Medical Record Number: [id number] Patient Account Number: [id number] Date of Birth/Sex: [birth year] (66 y.o. M) Treating RN: Donnamarie Poag Primary Care Celese Banner: Fulton Reek Other Clinician: Referring Vaun Hyndman: Fulton Reek Treating Skyann Ganim/Extender: Tito Dine in Treatment: 3 Active Inactive Wound/Skin Impairment Nursing Diagnoses: Impaired tissue integrity Knowledge deficit related to smoking impact on wound healing Knowledge deficit related to ulceration/compromised skin integrity Goals: Patient will have Wood decrease in wound volume by X% from date: (specify in notes) Date Initiated: 05/26/2021 Target Resolution Date: 06/23/2021 Goal Status: Active Patient/caregiver will verbalize understanding of skin care regimen  Date Initiated: 05/26/2021 Date Inactivated: 06/02/2021 Target Resolution Date: 06/09/2021 Goal Status: Met Interventions: Assess patient/caregiver ability to obtain necessary supplies Assess patient/caregiver ability to perform ulcer/skin care regimen upon admission and as needed Notes: Electronic  Signature(s) Signed: 06/16/2021 3:50:58 PM By: Donnamarie Poag Entered By: Donnamarie Poag on 06/16/2021 13:25:41 [name], [name] ([id number]) -------------------------------------------------------------------------------- Non-Wound Condition Assessment Details Patient Name: [name], [name]. Date of Service: 06/16/2021 1:00 PM Medical Record Number: [id number] Patient Account Number: [id number] Date of Birth/Sex: June 12, 1955 (66 y.o. M) Treating RN: Donnamarie Poag Primary Care Shantee Hayne: Fulton Reek Other Clinician: Referring Xaiden Fleig: Fulton Reek Treating Sheikh Leverich/Extender: [name] Dillon Weeks in Treatment: 3 Non-Wound Condition: Condition: Lymphedema Location: Leg Side: Right Photos Electronic Signature(s) Signed: 06/16/2021 3:50:58 PM By: Donnamarie Poag Entered By: Donnamarie Poag on 06/16/2021 13:23:47 [name], [name] ([id number]) -------------------------------------------------------------------------------- Non-Wound Condition Assessment Details Patient Name: [name], [name]. Date of Service: 06/16/2021 1:00 PM Medical Record Number: [id number] Patient Account Number: [id number] Date of Birth/Sex: 25-Oct-1955 (66 y.o. M) Treating RN: Donnamarie Poag Primary Care Darlene Brozowski: Fulton Reek Other Clinician: Referring Maahir Horst: Fulton Reek Treating Albi Rappaport/Extender: [name] Dillon Weeks in Treatment: 3 Non-Wound Condition: Condition: Lymphedema Location: Leg Side: Left Photos Electronic Signature(s) Signed: 06/16/2021 3:50:58 PM By: Donnamarie Poag Entered By: Donnamarie Poag on 06/16/2021 13:24:07 Blackard, [name] ([id number]) -------------------------------------------------------------------------------- Pain Assessment Details Patient Name: Garner, Caillou Wood. Date of Service: 06/16/2021 1:00 PM Medical Record Number: [id number] Patient Account Number: [id number] Date of Birth/Sex: 10/25/1955 (66 y.o. M) Treating RN: Donnamarie Poag Primary Care Priyansh Pry: Fulton Reek Other Clinician: Referring Nao Linz: Fulton Reek Treating [name] Clapsaddle/Extender: Tito Dine in Treatment: 3 Active Problems Location of Pain Severity and Description of Pain Patient Has Paino Yes Site Locations Pain Location: Pain in Ulcers Rate the pain. Current Pain Level: 5 Pain Management and Medication Current Pain Management: Electronic Signature(s) Signed: 06/16/2021 3:50:58 PM By: Donnamarie Poag Entered By: Donnamarie Poag on 06/16/2021 13:07:02 Belvedere, [name] ([id number]) -------------------------------------------------------------------------------- Patient/Caregiver Education Details Patient Name: [name]. Date of Service: 06/16/2021 1:00 PM Medical Record Number: [id number] Patient Account Number: [id number] Date of Birth/Gender: 01-May-1955 (66 y.o. M) Treating RN: Donnamarie Poag Primary Care Physician: Fulton Reek Other Clinician: Referring Physician: Fulton Reek Treating Physician/Extender: Tito Dine in Treatment: 3 Education Assessment Education Provided To: Patient Education Topics Provided Basic Hygiene: Infection: Offloading: Wound/Skin Impairment: Electronic Signature(s) Signed: 06/16/2021 3:50:58 PM By: Donnamarie Poag Entered By: Donnamarie Poag on 06/16/2021 14:01:02 Luiz, [name] ([id number]) -------------------------------------------------------------------------------- Wound Assessment Details Patient Name: Mancinelli, Sudeep Wood. Date of Service: 06/16/2021 1:00 PM Medical Record Number: [id number] Patient Account Number: [id number] Date of Birth/Sex: 09/22/1955 (66 y.o. M) Treating RN: Donnamarie Poag Primary Care Corretta Munce: Fulton Reek Other Clinician: Referring Kimara Bencomo: Fulton Reek Treating Aiven Kampe/Extender: Tito Dine in Treatment: 3 Wound Status Wound Number: 10 Primary Venous Leg Ulcer Etiology: Wound Location: Right, Posterior Lower Leg Wound Healed -  Epithelialized Wounding Event: Gradually Appeared Status: Date Acquired: 05/26/2021 Comorbid Lymphedema, Asthma, Chronic Obstructive Pulmonary Weeks Of Treatment: 1 History: Disease (COPD), Sleep Apnea, Arrhythmia, Congestive Clustered Wound: No Heart Failure, Coronary Artery Disease, Hypertension, Myocardial Infarction, Peripheral Venous Disease, Colitis, Type II Diabetes, History of pressure wounds, Gout, Osteoarthritis Photos Wound Measurements Length: (cm) 0 Width: (cm) 0 Depth: (cm) 0 Area: (cm) 0 Volume: (cm) 0 % Reduction in Area: 100% % Reduction in Volume: 100% Epithelialization: Large (67-100%) Wound Description Classification: Full Thickness Without Exposed Support Structure Exudate Amount: None Present s Foul Odor After Cleansing: No Slough/Fibrino No Wound  Bed Granulation Amount: None Present (0%) Exposed Structure Necrotic Amount: None Present (0%) Fascia Exposed: No Fat Layer (Subcutaneous Tissue) Exposed: No Tendon Exposed: No Muscle Exposed: No Joint Exposed: No Bone Exposed: No Electronic Signature(s) Signed: 06/16/2021 3:50:58 PM By: Donnamarie Poag Entered By: Donnamarie Poag on 06/16/2021 13:22:49 Radziewicz, [name] ([id number]) -------------------------------------------------------------------------------- Wound Assessment Details Patient Name: Sobocinski, Alireza Wood. Date of Service: 06/16/2021 1:00 PM Medical Record Number: [id number] Patient Account Number: [id number] Date of Birth/Sex: 1955-06-20 (66 y.o. M) Treating RN: Donnamarie Poag Primary Care Wafaa Deemer: Fulton Reek Other Clinician: Referring Deerica Waszak: Fulton Reek Treating Carlita Whitcomb/Extender: Tito Dine in Treatment: 3 Wound Status Wound Number: 11 Primary Venous Leg Ulcer Etiology: Wound Location: Right Lower Leg Wound Open Wounding Event: Gradually Appeared Status: Date Acquired: 06/16/2021 Comorbid Lymphedema, Asthma, Chronic Obstructive Pulmonary Weeks Of Treatment:  1 History: Disease (COPD), Sleep Apnea, Arrhythmia, Congestive Clustered Wound: No Heart Failure, Coronary Artery Disease, Hypertension, Myocardial Infarction, Peripheral Venous Disease, Colitis, Type II Diabetes, History of pressure wounds, Gout, Osteoarthritis Photos Wound Measurements Length: (cm) 0.5 Width: (cm) 0.4 Depth: (cm) 0.1 Area: (cm) 0.157 Volume: (cm) 0.016 % Reduction in Area: 0% % Reduction in Volume: 0% Wound Description Classification: Full Thickness Without Exposed Support Structures Exudate Amount: Medium Exudate Type: Serosanguineous Exudate Color: red, brown Foul Odor After Cleansing: No Slough/Fibrino No Wound Bed Granulation Amount: Large (67-100%) Exposed Structure Granulation Quality: Red Fascia Exposed: No Necrotic Amount: None Present (0%) Fat Layer (Subcutaneous Tissue) Exposed: Yes Tendon Exposed: No Muscle Exposed: No Joint Exposed: No Bone Exposed: No Treatment Notes Wound #11 (Lower Leg) Wound Laterality: Right Cleanser Peri-Wound Care Lashway, Simran AMarland Kitchen ([id number]) Topical Primary Dressing Secondary Dressing Secured With Compression Wrap Compression Stockings Add-Ons Electronic Signature(s) Signed: 06/16/2021 3:50:58 PM By: Donnamarie Poag Entered By: Donnamarie Poag on 06/16/2021 14:12:59 Cauthorn, [name] ([id number]) -------------------------------------------------------------------------------- Wound Assessment Details Patient Name: Henricks, Curties Wood. Date of Service: 06/16/2021 1:00 PM Medical Record Number: [id number] Patient Account Number: [id number] Date of Birth/Sex: 16-Feb-1956 (66 y.o. M) Treating RN: Donnamarie Poag Primary Care Melroy Bougher: Fulton Reek Other Clinician: Referring Xoie Kreuser: Fulton Reek Treating Konya Fauble/Extender: Tito Dine in Treatment: 3 Wound Status Wound Number: 7 Primary Pressure Ulcer Etiology: Wound Location: Left Calcaneus Wound Open Wounding Event: Gradually  Appeared Status: Date Acquired: 05/05/2021 Comorbid Lymphedema, Asthma, Chronic Obstructive Pulmonary Weeks Of Treatment: 3 History: Disease (COPD), Sleep Apnea, Arrhythmia, Congestive Clustered Wound: No Heart Failure, Coronary Artery Disease, Hypertension, Pending Amputation On Presentation Myocardial Infarction, Peripheral Venous Disease, Colitis, Type II Diabetes, History of pressure wounds, Gout, Osteoarthritis Photos Wound Measurements Length: (cm) 3 % Red Width: (cm) 3.6 % Red Depth: (cm) 1.5 [place] Area: (cm) 8.482 Unde Volume: (cm) 12.723 St End Max uction in Area: -48.8% uction in Volume: -123.1% ling: No rmining: Yes arting Position (o'clock): 10 ing Position (o'clock): 3 imum Distance: (cm) 0.5 Wound Description Classification: Category/Stage III Foul Wound Margin: Thickened Slou Exudate Amount: Large Exudate Type: Serosanguineous Exudate Color: red, brown Odor After Cleansing: No gh/Fibrino Yes Wound Bed Granulation Amount: Small (1-33%) Exposed Structure Granulation Quality: Pink Fascia Exposed: No Necrotic Amount: Large (67-100%) Fat Layer (Subcutaneous Tissue) Exposed: Yes Necrotic Quality: Eschar, Adherent Slough Tendon Exposed: No Muscle Exposed: No Joint Exposed: No Bone Exposed: No Treatment Notes Wound #7 (Calcaneus) Wound Laterality: Left Biello, Branden Wood. ([id number]) Cleanser Normal Saline Discharge Instruction: Wash your hands with soap and water. Remove old dressing, discard into plastic bag and place into trash. Cleanse the wound with Normal Saline prior to applying Wood  clean dressing using gauze sponges, not tissues or cotton balls. Do not scrub or use excessive force. Pat dry using gauze sponges, not tissue or cotton balls. Soap and Water Discharge Instruction: Gently cleanse wound with antibacterial soap, rinse and pat dry prior to dressing wounds Wound Cleanser Discharge Instruction: Wash your hands with soap and water. Remove old  dressing, discard into plastic bag and place into trash. Cleanse the wound with Wound Cleanser prior to applying Wood clean dressing using gauze sponges, not tissues or cotton balls. Do not scrub or use excessive force. Pat dry using gauze sponges, not tissue or cotton balls. Peri-Wound Care Topical Triamcinolone Acetonide Cream, 0.1%, 15 (g) tube Discharge Instruction: Mixed with half Eucerin or alternative-to bilateral lower legsApply as directed by . Eucerin lotion Discharge Instruction: Mixed 1/2 with TCA to bilateral lower legs Primary Dressing Silvercel 4 1/4x 4 1/4 (in/in) Discharge Instruction: Apply Silvercel 4 1/4x 4 1/4 (in/in) as instructed Secondary Dressing Zetuvit Plus Silicone Non-bordered 5x5 (in/in) Secured With Compression Wrap Profore Lite LF 3 Multilayer Compression Bandaging System Discharge Instruction: Apply 3 multi-layer wrap as prescribed. Compression Stockings Add-Ons Electronic Signature(s) Signed: 06/16/2021 3:50:58 PM By: Donnamarie Poag Entered By: Donnamarie Poag on 06/16/2021 13:19:21 Hopping, [name] ([id number]) -------------------------------------------------------------------------------- Wound Assessment Details Patient Name: Ermis, Daegan Wood. Date of Service: 06/16/2021 1:00 PM Medical Record Number: [id number] Patient Account Number: [id number] Date of Birth/Sex: [birth year] (66 y.o. M) Treating RN: Donnamarie Poag Primary Care : Fulton Reek Other Clinician: Referring : Fulton Reek Treating /Extender: Tito Dine in Treatment: 3 Wound Status Wound Number: 9 Primary Pressure Ulcer Etiology: Wound Location: Right Toe Great Secondary Diabetic Wound/Ulcer of the Lower Extremity Wounding Event: Gradually Appeared Etiology: Date Acquired: 04/26/2021 Wound Open Weeks Of Treatment: 3 Status: Clustered Wound: No Comorbid Lymphedema, Asthma, Chronic Obstructive Pulmonary Pending Amputation On  Presentation History: Disease (COPD), Sleep Apnea, Arrhythmia, Congestive Heart Failure, Coronary Artery Disease, Hypertension, Myocardial Infarction, Peripheral Venous Disease, Colitis, Type II Diabetes, History of pressure wounds, Gout, Osteoarthritis Photos Wound Measurements Length: (cm) 1 % Redu Width: (cm) 1 % Redu Depth: (cm) 0.1 Tunnel Area: (cm) 0.785 Under Volume: (cm) 0.079 ction in Area: -25% ction in Volume: -25.4% ing: No mining: No Wound Description Classification: Category/Stage III Foul Exudate Amount: Medium Sloug Exudate Type: Serosanguineous Exudate Color: red, brown Odor After Cleansing: No h/Fibrino Yes Wound Bed Granulation Amount: Large (67-100%) Exposed Structure Granulation Quality: Red, Pink Fascia Exposed: No Necrotic Amount: Small (1-33%) Fat Layer (Subcutaneous Tissue) Exposed: Yes Necrotic Quality: Adherent Slough Tendon Exposed: No Muscle Exposed: No Joint Exposed: No Bone Exposed: No Treatment Notes Wound #9 (Toe Great) Wound Laterality: Right Cleanser Vandeberg, Jerid Wood. ([id number]) Normal Saline Discharge Instruction: Wash your hands with soap and water. Remove old dressing, discard into plastic bag and place into trash. Cleanse the wound with Normal Saline prior to applying Wood clean dressing using gauze sponges, not tissues or cotton balls. Do not scrub or use excessive force. Pat dry using gauze sponges, not tissue or cotton balls. Soap and Water Discharge Instruction: Gently cleanse wound with antibacterial soap, rinse and pat dry prior to dressing wounds Wound Cleanser Discharge Instruction: Wash your hands with soap and water. Remove old dressing, discard into plastic bag and place into trash. Cleanse the wound with Wound Cleanser prior to applying Wood clean dressing using gauze sponges, not tissues or cotton balls. Do not scrub or use excessive force. Pat dry using gauze sponges, not tissue or cotton balls. Peri-Wound  Care Topical  Triamcinolone Acetonide Cream, 0.1%, 15 (g) tube Discharge Instruction: Mixed with half Eucerin or alternative-to bilateral lower legsApply as directed by Antwan Bribiesca. Eucerin lotion Discharge Instruction: Mixed 1/2 with TCA to bilateral lower legs Primary Dressing Iodosorb 40 (g) Discharge Instruction: Apply IodoSorb to wound bed only as directed. Secondary Dressing Zetuvit Plus Silicone Non-bordered 5x5 (in/in) Secured With Compression Wrap Profore Lite LF 3 Multilayer Compression Bandaging System Discharge Instruction: Apply 3 multi-layer wrap as prescribed. Compression Stockings Add-Ons Electronic Signature(s) Signed: 06/16/2021 3:50:58 PM By: Donnamarie Poag Entered By: Donnamarie Poag on 06/16/2021 13:21:38 Schlarb, [name] ([id number]) -------------------------------------------------------------------------------- Vitals Details Patient Name: [name]. Date of Service: 06/16/2021 1:00 PM Medical Record Number: [id number] Patient Account Number: [id number] Date of Birth/Sex: [birth year] (66 y.o. M) Treating RN: Donnamarie Poag Primary Care Tyechia Allmendinger: Fulton Reek Other Clinician: Referring Dawnna Gritz: Fulton Reek Treating Jovon Streetman/Extender: Tito Dine in Treatment: 3 Vital Signs Time Taken: 13:04 Temperature (F): 97.9 Height (in): 73 Pulse (bpm): 102 Weight (lbs): 371 Respiratory Rate (breaths/min): 20 Body Mass Index (BMI): 48.9 Blood Pressure (mmHg): 149/93 Reference Range: 80 - 120 mg / dl Electronic Signature(s) Signed: 06/16/2021 3:50:58 PM By: Donnamarie Poag Entered ByDonnamarie Poag on 06/16/2021 13:06:37

## 2021-06-16 NOTE — Progress Notes (Addendum)
[name], [name] ([id number]) Visit Report for 06/16/2021 Debridement Details Patient Name: [name], [name] Date of Service: 06/16/2021 1:00 PM Medical Record Number: [id number] Patient Account Number: [id number] Date of Birth/Sex: May 09, 1955 (66 y.o. M) Treating RN: Donnamarie Poag Primary Care Provider: Fulton Reek Other Clinician: Referring Provider: Fulton Reek Treating Provider/Extender: Tito Dine in Treatment: 3 Debridement Performed for Wound #7 Left Calcaneus Assessment: Performed By: Physician [name] Dillon, MD Debridement Type: Debridement Level of Consciousness (Pre- Awake and Alert procedure): Pre-procedure Verification/Time Out Yes - 13:44 Taken: Start Time: 13:45 Pain Control: Lidocaine Total Area Debrided (L x W): 3 (cm) x 3.5 (cm) = 10.5 (cm) Tissue and other material Viable, Non-Viable, Slough, Subcutaneous, Slough debrided: Level: Skin/Subcutaneous Tissue Debridement Description: Excisional Instrument: Curette Specimen: Tissue Culture Number of Specimens Taken: 1 Bleeding: Large Hemostasis Achieved: Silver Nitrate Response to Treatment: Procedure was tolerated well Level of Consciousness (Post- Awake and Alert procedure): Post Debridement Measurements of Total Wound Length: (cm) 3 Stage: Category/Stage III Width: (cm) 3.6 Depth: (cm) 1.5 Volume: (cm) 12.723 Character of Wound/Ulcer Post Debridement: Improved Post Procedure Diagnosis Same as Pre-procedure Electronic Signature(s) Signed: 06/16/2021 3:30:14 PM By: Linton Ham MD Signed: 06/16/2021 3:50:58 PM By: Donnamarie Poag Entered By: Donnamarie Poag on 06/16/2021 13:52:12 [name], [name] ([id number]) -------------------------------------------------------------------------------- HPI Details Patient Name: [name], [name] Date of Service: 06/16/2021 1:00 PM Medical Record Number: [id number] Patient Account Number: [id number] Date of Birth/Sex: 03/04/1956 (66 y.o.  M) Treating RN: Donnamarie Poag Primary Care Provider: Fulton Reek Other Clinician: Referring Provider: Fulton Reek Treating Provider/Extender: Tito Dine in Treatment: 3 History of Present Illness HPI Description: Admission 8/31 Mr. samantha olivera is a 66 year old male with a past medical history of uncontrolled insulin-dependent type 2 diabetes, CAD, osteomyelitis to the left third toe status post partial third ray amputation, and peripheral arterial disease that presents for a left plantar foot wound and bilateral lower extremity wounds. The left plantar foot wound has been present for at least 7 months. He has been following podiatry for this issue. He uses silver collagen to this area. He reports that in the past 1 to 2 months he has developed multiple scattered open wounds to his lower extremities bilaterally that have worsened over time. He states that they often develop as either blisters or he hits his leg against an object.He is currently using Betadine to these areas. He denies signs of infection. He has compression stockings however he cannot put these on very easily. Also he is on his feet most of the day. He states he does not have anyone to help him do the activities he needs to get done. Readmission: 05/26/2021 upon evaluation today patient presents for reevaluation here in the clinic concerning issues he is been having with his bilateral lower extremities, his right great toe, and left heel primarily. With that being said he has been in the hospital recently and his hemoglobin A1c on January 10 was 9.2. With that being said he does have a lot of really thickened dry skin attached to his legs when he try to remove that today. He is in agreement with the plan. Nonetheless I do believe that based on what we are seeing he is been having a lot of issues with his congestive heart failure I do have him on fluid pills he does go to the bathroom quite a bit. Subsequently  has been in [employer] but I do not think that is doing quite enough to help control  his edema. He also needs something better than Santyl to put on the heel right now when he was in the hospital they actually cut a space open in the compression around his heel so they can open it up and actually apply the Santyl daily. I think he needs something better than that. Again the patient does have a history of diabetes mellitus type 2, chronic venous insufficiency, hypertension, and congestive heart failure. He also has signs of stage III lymphedema as well. 06/02/2021 upon evaluation today patient appears to be doing decently well in regard to his wounds. Fortunately I do not see any signs of active infection at this time which is great news. Fortunately I think that he is making excellent progress. Nonetheless I do think that we seem to be making some progress here. 06/09/2021 upon evaluation today patient appears to be doing well with regard to his legs all things considered unfortunately the left heel is not doing nearly as well as what I like to see. There is a lot of necrotic tissue this is very boggy. I think that it is going to require potentially much more debridement but again we really need to get the results of the arterial testing done and this is actually scheduled for the 15th. 2/17; arrives in clinic today with the left plantar heel wound completely necrotic and malodorous. Comparing the picture with last week this looks quite a bit worse. He has been using Iodoflex under compression. He has an area on the right first toe which seems clean and better and a new area on the right anterior mid Tibia. I was unable to find the results of his arterial studies that he had done on 1/15. Presumably they have not been read. I can see that they have been done Electronic Signature(s) Signed: 06/16/2021 3:30:14 PM By: Linton Ham MD Entered By: Linton Ham on 06/16/2021 13:59:45 [name],  [name] ([id number]) -------------------------------------------------------------------------------- Physical Exam Details Patient Name: [name], [name] Date of Service: 06/16/2021 1:00 PM Medical Record Number: [id number] Patient Account Number: [id number] Date of Birth/Sex: 02-11-1956 (66 y.o. M) Treating RN: Donnamarie Poag Primary Care Provider: Fulton Reek Other Clinician: Referring Provider: Fulton Reek Treating Provider/Extender: Tito Dine in Treatment: 3 Constitutional Patient is hypertensive.. Pulse regular and within target range for patient.Marland Kitchen Respirations regular, non-labored and within target range.. Temperature is normal and within the target range for the patient.Marland Kitchen appears in no distress. Notes Wound exam; the left heel is completely necrotic and malodorous. Boggy. I used a #15 scalpel and pickups to remove the surface here and then used a #5 curette. Following an aggressive debridement I did a swab culture of the surface underneath this. Hemostasis with Gelfoam and a pressure dressing Electronic Signature(s) Signed: 06/16/2021 3:30:14 PM By: Linton Ham MD Entered By: Linton Ham on 06/16/2021 14:00:38 [name], [name] ([id number]) -------------------------------------------------------------------------------- Physician Orders Details Patient Name: Sedlacek, Jaree A. Date of Service: 06/16/2021 1:00 PM Medical Record Number: [id number] Patient Account Number: [id number] Date of Birth/Sex: [birth year] (66 y.o. M) Treating RN: Donnamarie Poag Primary Care Provider: Fulton Reek Other Clinician: Referring Provider: Fulton Reek Treating Provider/Extender: Tito Dine in Treatment: 3 Verbal / Phone Orders: No Diagnosis Coding ICD-10 Coding Code Description [phone] Pressure ulcer of left heel, stage 3 E11.621 Type 2 diabetes mellitus with foot ulcer L97.522 Non-pressure chronic ulcer of other part of left foot with fat  layer exposed L89.893 Pressure ulcer of other site, stage 3 I87.2 Venous insufficiency (chronic) (peripheral) I50.42 Chronic  combined systolic (congestive) and diastolic (congestive) heart failure I10 Essential (primary) hypertension L97.818 Non-pressure chronic ulcer of other part of right lower leg with other specified severity Follow-up Appointments o Return Appointment in 1 week. o Nurse Visit as needed [facility] for wound care. May utilize formulary equivalent dressing for wound treatment orders unless otherwise specified. Home Health Nurse may visit PRN to address patientos wound care needs. o Scheduled days for dressing changes to be completed; exception, patient has scheduled wound care visit that day. - HH to wrap twice weekly and at wound center one time unless he is unable to have appt at wound center that week o **Please direct any NON-WOUND related issues/requests for orders to patient's Primary Care Physician. **If current dressing causes regression in wound condition, may D/C ordered dressing product/s and apply Normal Saline Moist Dressing daily until next [place] or Other MD appointment. **Notify Wound Healing Center of regression in wound condition at [phone]. Bathing/ Shower/ Hygiene o May shower with wound dressing protected with water repellent cover or cast protector. o No tub bath. Anesthetic (Use 'Patient Medications' Section for Anesthetic Order Entry) o Lidocaine applied to wound bed Edema Control - Lymphedema / Segmental Compressive Device / Other o Optional: One layer of unna paste to top of compression wrap (to act as an anchor). o Elevate legs to the level of the heart and pump ankles as often as possible o Elevate leg(s) parallel to the floor when sitting. o DO YOUR BEST to sleep in the bed at night. DO NOT sleep in your recliner. Long hours of sitting in a recliner  leads to swelling of the legs and/or potential wounds on your backside. Non-Wound Condition o Additional non-wound orders/instructions: - Triamcinolone mixed with lotion to bilateral lower legs gauze between toes to keep dry; bandaid to cover newly healed skin to toe Off-Loading o Open toe surgical shoe with peg assist. - L heel wedge shoe o Turn and reposition every 2 hours - keep pressure off of left heel Additional Orders / Instructions o Follow Nutritious Diet and Increase Protein Intake - monitor blood sugar Medications-Please add to medication list. o P.O. Antibiotics - Pick up the antibiotic today and take as directed starting today Finerty, Avelino A. ([id number]) Wound Treatment Wound #7 - Calcaneus Wound Laterality: Left Cleanser: Normal Saline 3 x Per Week/15 Days Discharge Instructions: Wash your hands with soap and water. Remove old dressing, discard into plastic bag and place into trash. Cleanse the wound with Normal Saline prior to applying a clean dressing using gauze sponges, not tissues or cotton balls. Do not scrub or use excessive force. Pat dry using gauze sponges, not tissue or cotton balls. Cleanser: Soap and Water 3 x Per Week/15 Days Discharge Instructions: Gently cleanse wound with antibacterial soap, rinse and pat dry prior to dressing wounds Cleanser: Wound Cleanser 3 x Per Week/15 Days Discharge Instructions: Wash your hands with soap and water. Remove old dressing, discard into plastic bag and place into trash. Cleanse the wound with Wound Cleanser prior to applying a clean dressing using gauze sponges, not tissues or cotton balls. Do not scrub or use excessive force. Pat dry using gauze sponges, not tissue or cotton balls. Topical: Triamcinolone Acetonide Cream, 0.1%, 15 (g) tube 3 x Per Week/15 Days Discharge Instructions: Mixed with half Eucerin or alternative-to bilateral lower legsApply as directed by provider. Topical: Eucerin lotion 3 x Per  Week/15 Days Discharge Instructions: Mixed 1/2  with TCA to bilateral lower legs Primary Dressing: Silvercel 4 1/4x 4 1/4 (in/in) 3 x Per Week/15 Days Discharge Instructions: Apply Silvercel 4 1/4x 4 1/4 (in/in) as instructed Secondary Dressing: Zetuvit Plus Silicone Non-bordered 5x5 (in/in) 3 x Per Week/15 Days Compression Wrap: Profore Lite LF 3 Multilayer Compression Bandaging System 3 x Per Week/15 Days Discharge Instructions: Apply 3 multi-layer wrap as prescribed. Wound #9 - Toe Great Wound Laterality: Right Cleanser: Normal Saline 3 x Per Week/15 Days Discharge Instructions: Wash your hands with soap and water. Remove old dressing, discard into plastic bag and place into trash. Cleanse the wound with Normal Saline prior to applying a clean dressing using gauze sponges, not tissues or cotton balls. Do not scrub or use excessive force. Pat dry using gauze sponges, not tissue or cotton balls. Cleanser: Soap and Water 3 x Per Week/15 Days Discharge Instructions: Gently cleanse wound with antibacterial soap, rinse and pat dry prior to dressing wounds Cleanser: Wound Cleanser 3 x Per Week/15 Days Discharge Instructions: Wash your hands with soap and water. Remove old dressing, discard into plastic bag and place into trash. Cleanse the wound with Wound Cleanser prior to applying a clean dressing using gauze sponges, not tissues or cotton balls. Do not scrub or use excessive force. Pat dry using gauze sponges, not tissue or cotton balls. Topical: Triamcinolone Acetonide Cream, 0.1%, 15 (g) tube 3 x Per Week/15 Days Discharge Instructions: Mixed with half Eucerin or alternative-to bilateral lower legsApply as directed by provider. Topical: Eucerin lotion 3 x Per Week/15 Days Discharge Instructions: Mixed 1/2 with TCA to bilateral lower legs Primary Dressing: Iodosorb 40 (g) 3 x Per Week/15 Days Discharge Instructions: Apply IodoSorb to wound bed only as directed. Secondary Dressing: Zetuvit Plus  Silicone Non-bordered 5x5 (in/in) 3 x Per Week/15 Days Compression Wrap: Profore Lite LF 3 Multilayer Compression Bandaging System 3 x Per Week/15 Days Discharge Instructions: Apply 3 multi-layer wrap as prescribed. Laboratory o Bacteria identified in Wound by Culture (MICRO) - (ICD10 [phone] - Pressure ulcer of left heel, stage 3) oooo LOINC Code: 1950-9 oooo Convenience Name: Wound culture routine Patient Medications [name], [name] ([id number]) Allergies: No Known Allergies Notifications Medication Indication Start End Augmentin wound infection left 06/16/2021 heel DOSE oral 500 mg-125 mg tablet - 1 tablet oral bid for 7 days Electronic Signature(s) Signed: 06/21/2021 4:40:06 PM By: Donnamarie Poag Signed: 07/07/2021 1:04:35 PM By: Linton Ham MD Previous Signature: 06/21/2021 1:59:49 PM Version By: Donnamarie Poag Previous Signature: 06/16/2021 2:03:29 PM Version By: Linton Ham MD Entered By: Donnamarie Poag on 06/21/2021 16:35:45 [name], [name] ([id number]) -------------------------------------------------------------------------------- Problem List Details Patient Name: Housman, Geoff A. Date of Service: 06/16/2021 1:00 PM Medical Record Number: [id number] Patient Account Number: [id number] Date of Birth/Sex: March 11, 1956 (67 y.o. M) Treating RN: Donnamarie Poag Primary Care Provider: Fulton Reek Other Clinician: Referring Provider: Fulton Reek Treating Provider/Extender: Tito Dine in Treatment: 3 Active Problems ICD-10 Encounter Code Description Active Date MDM Diagnosis [phone] Pressure ulcer of left heel, stage 3 05/26/2021 No Yes E11.621 Type 2 diabetes mellitus with foot ulcer 05/26/2021 No Yes L97.522 Non-pressure chronic ulcer of other part of left foot with fat layer 05/26/2021 No Yes exposed L89.893 Pressure ulcer of other site, stage 3 05/26/2021 No Yes I87.2 Venous insufficiency (chronic) (peripheral) 05/26/2021 No Yes I50.42 Chronic combined  systolic (congestive) and diastolic (congestive) heart 05/26/2021 No Yes failure I10 Essential (primary) hypertension 05/26/2021 No Yes L97.818 Non-pressure chronic ulcer of other part of right lower leg with other  06/16/2021 No Yes specified severity Inactive Problems Resolved Problems Electronic Signature(s) Signed: 06/16/2021 3:30:14 PM By: Linton Ham MD Entered By: Linton Ham on 06/16/2021 13:53:58 [name], [name] ([id number]) -------------------------------------------------------------------------------- Progress Note Details Patient Name: [name], [name] Date of Service: 06/16/2021 1:00 PM Medical Record Number: [id number] Patient Account Number: [id number] Date of Birth/Sex: Aug 09, 1955 (66 y.o. M) Treating RN: Donnamarie Poag Primary Care Provider: Fulton Reek Other Clinician: Referring Provider: Fulton Reek Treating Provider/Extender: Tito Dine in Treatment: 3 Subjective History of Present Illness (HPI) Admission 8/31 Mr. ezrael sam is a 66 year old male with a past medical history of uncontrolled insulin-dependent type 2 diabetes, CAD, osteomyelitis to the left third toe status post partial third ray amputation, and peripheral arterial disease that presents for a left plantar foot wound and bilateral lower extremity wounds. The left plantar foot wound has been present for at least 7 months. He has been following podiatry for this issue. He uses silver collagen to this area. He reports that in the past 1 to 2 months he has developed multiple scattered open wounds to his lower extremities bilaterally that have worsened over time. He states that they often develop as either blisters or he hits his leg against an object.He is currently using Betadine to these areas. He denies signs of infection. He has compression stockings however he cannot put these on very easily. Also he is on his feet most of the day. He states he does not have anyone to help  him do the activities he needs to get done. Readmission: 05/26/2021 upon evaluation today patient presents for reevaluation here in the clinic concerning issues he is been having with his bilateral lower extremities, his right great toe, and left heel primarily. With that being said he has been in the hospital recently and his hemoglobin A1c on January 10 was 9.2. With that being said he does have a lot of really thickened dry skin attached to his legs when he try to remove that today. He is in agreement with the plan. Nonetheless I do believe that based on what we are seeing he is been having a lot of issues with his congestive heart failure I do have him on fluid pills he does go to the bathroom quite a bit. Subsequently has been in [employer] but I do not think that is doing quite enough to help control his edema. He also needs something better than Santyl to put on the heel right now when he was in the hospital they actually cut a space open in the compression around his heel so they can open it up and actually apply the Santyl daily. I think he needs something better than that. Again the patient does have a history of diabetes mellitus type 2, chronic venous insufficiency, hypertension, and congestive heart failure. He also has signs of stage III lymphedema as well. 06/02/2021 upon evaluation today patient appears to be doing decently well in regard to his wounds. Fortunately I do not see any signs of active infection at this time which is great news. Fortunately I think that he is making excellent progress. Nonetheless I do think that we seem to be making some progress here. 06/09/2021 upon evaluation today patient appears to be doing well with regard to his legs all things considered unfortunately the left heel is not doing nearly as well as what I like to see. There is a lot of necrotic tissue this is very boggy. I think that it is going to  require potentially much more debridement but  again we really need to get the results of the arterial testing done and this is actually scheduled for the 15th. 2/17; arrives in clinic today with the left plantar heel wound completely necrotic and malodorous. Comparing the picture with last week this looks quite a bit worse. He has been using Iodoflex under compression. He has an area on the right first toe which seems clean and better and a new area on the right anterior mid Tibia. I was unable to find the results of his arterial studies that he had done on 1/15. Presumably they have not been read. I can see that they have been done Objective Constitutional Patient is hypertensive.. Pulse regular and within target range for patient.Marland Kitchen Respirations regular, non-labored and within target range.. Temperature is normal and within the target range for the patient.Marland Kitchen appears in no distress. Vitals Time Taken: 1:04 PM, Height: 73 in, Weight: 371 lbs, BMI: 48.9, Temperature: 97.9 F, Pulse: 102 bpm, Respiratory Rate: 20 breaths/min, Blood Pressure: 149/93 mmHg. General Notes: Wound exam; the left heel is completely necrotic and malodorous. Boggy. I used a #15 scalpel and pickups to remove the surface here and then used a #5 curette. Following an aggressive debridement I did a swab culture of the surface underneath this. Hemostasis with Gelfoam and a pressure dressing Integumentary (Hair, Skin) Wound #10 status is Healed - Epithelialized. Original cause of wound was Gradually Appeared. The date acquired was: 05/26/2021. The wound has been in treatment 1 weeks. The wound is located on the Right,Posterior Lower Leg. The wound measures 0cm length x 0cm width x 0cm depth; 0cm^2 area and 0cm^3 volume. There is a none present amount of drainage noted. There is no granulation within the wound bed. There is no [name], [name] ([id number]) necrotic tissue within the wound bed. Wound #7 status is Open. Original cause of wound was Gradually Appeared. The date  acquired was: 05/05/2021. The wound has been in treatment 3 weeks. The wound is located on the Left Calcaneus. The wound measures 3cm length x 3.6cm width x 1.5cm depth; 8.482cm^2 area and 12.723cm^3 volume. There is Fat Layer (Subcutaneous Tissue) exposed. There is no tunneling noted, however, there is undermining starting at 10:00 and ending at 3:00 with a maximum distance of 0.5cm. There is a large amount of serosanguineous drainage noted. The wound margin is thickened. There is small (1-33%) pink granulation within the wound bed. There is a large (67-100%) amount of necrotic tissue within the wound bed including Eschar and Adherent Slough. Wound #9 status is Open. Original cause of wound was Gradually Appeared. The date acquired was: 04/26/2021. The wound has been in treatment 3 weeks. The wound is located on the Right Toe Great. The wound measures 1cm length x 1cm width x 0.1cm depth; 0.785cm^2 area and 0.079cm^3 volume. There is Fat Layer (Subcutaneous Tissue) exposed. There is no tunneling or undermining noted. There is a medium amount of serosanguineous drainage noted. There is large (67-100%) red, pink granulation within the wound bed. There is a small (1-33%) amount of necrotic tissue within the wound bed including Adherent Slough. Assessment Active Problems ICD-10 Pressure ulcer of left heel, stage 3 Type 2 diabetes mellitus with foot ulcer Non-pressure chronic ulcer of other part of left foot with fat layer exposed Pressure ulcer of other site, stage 3 Venous insufficiency (chronic) (peripheral) Chronic combined systolic (congestive) and diastolic (congestive) heart failure Essential (primary) hypertension Non-pressure chronic ulcer of other part of right lower  leg with other specified severity Procedures Wound #7 Pre-procedure diagnosis of Wound #7 is a Pressure Ulcer located on the Left Calcaneus . There was a Excisional Skin/Subcutaneous Tissue Debridement with a total area of  10.5 sq cm performed by [name] Dillon, MD. With the following instrument(s): Curette to remove Viable and Non-Viable tissue/material. Material removed includes Subcutaneous Tissue and Slough and after achieving pain control using Lidocaine. 1 specimen was taken by a Tissue Culture and sent to the lab per facility protocol. A time out was conducted at 13:44, prior to the start of the procedure. A Large amount of bleeding was controlled with Silver Nitrate. The procedure was tolerated well. Post Debridement Measurements: 3cm length x 3.6cm width x 1.5cm depth; 12.723cm^3 volume. Post debridement Stage noted as Category/Stage III. Character of Wound/Ulcer Post Debridement is improved. Post procedure Diagnosis Wound #7: Same as Pre-Procedure Pre-procedure diagnosis of Wound #7 is a Pressure Ulcer located on the Left Calcaneus . There was a Three Layer Compression Therapy Procedure by Donnamarie Poag, RN. Post procedure Diagnosis Wound #7: Same as Pre-Procedure Wound #9 Pre-procedure diagnosis of Wound #9 is a Pressure Ulcer located on the Right Toe Great . There was a Three Layer Compression Therapy Procedure by Donnamarie Poag, RN. Post procedure Diagnosis Wound #9: Same as Pre-Procedure Plan Follow-up Appointments: Return Appointment in 1 week. Nurse Visit as needed Home Health: [facility]: - [place] for wound care. May utilize formulary equivalent dressing for wound treatment orders unless otherwise specified. Home Health Nurse may visit PRN to address patient s wound care needs. Scheduled days for dressing changes to be completed; exception, patient has scheduled wound care visit that day. - HH to wrap twice weekly and [name], [name] ([id number]) at wound center one time unless he is unable to have appt at wound center that week **Please direct any NON-WOUND related issues/requests for orders to patient's Primary Care Physician. **If current dressing causes  regression in wound condition, may D/C ordered dressing product/s and apply Normal Saline Moist Dressing daily until next [place] or Other MD appointment. **Notify [facility] of regression in wound condition at [phone]. Bathing/ Shower/ Hygiene: May shower with wound dressing protected with water repellent cover or cast protector. No tub bath. Anesthetic (Use 'Patient Medications' Section for Anesthetic Order Entry): Lidocaine applied to wound bed Edema Control - Lymphedema / Segmental Compressive Device / Other: Optional: One layer of unna paste to top of compression wrap (to act as an anchor). Elevate legs to the level of the heart and pump ankles as often as possible Elevate leg(s) parallel to the floor when sitting. DO YOUR BEST to sleep in the bed at night. DO NOT sleep in your recliner. Long hours of sitting in a recliner leads to swelling of the legs and/or potential wounds on your backside. Non-Wound Condition: Additional non-wound orders/instructions: - Triamcinolone mixed with lotion to bilateral lower legs gauze between toes to keep dry; bandaid to cover newly healed skin to toe Off-Loading: Open toe surgical shoe with peg assist. - L heel wedge shoe Turn and reposition every 2 hours - keep pressure off of left heel Additional Orders / Instructions: Follow Nutritious Diet and Increase Protein Intake - monitor blood sugar Medications-Please add to medication list.: P.O. Antibiotics - Pick up the antibiotic today and take as directed starting today WOUND #7: - Calcaneus Wound Laterality: Left Cleanser: Normal Saline 3 x Per Week/15 Days Discharge Instructions: Wash your hands with soap and  water. Remove old dressing, discard into plastic bag and place into trash. Cleanse the wound with Normal Saline prior to applying a clean dressing using gauze sponges, not tissues or cotton balls. Do not scrub or use excessive force. Pat dry using gauze sponges, not  tissue or cotton balls. Cleanser: Soap and Water 3 x Per Week/15 Days Discharge Instructions: Gently cleanse wound with antibacterial soap, rinse and pat dry prior to dressing wounds Cleanser: Wound Cleanser 3 x Per Week/15 Days Discharge Instructions: Wash your hands with soap and water. Remove old dressing, discard into plastic bag and place into trash. Cleanse the wound with Wound Cleanser prior to applying a clean dressing using gauze sponges, not tissues or cotton balls. Do not scrub or use excessive force. Pat dry using gauze sponges, not tissue or cotton balls. Topical: Triamcinolone Acetonide Cream, 0.1%, 15 (g) tube 3 x Per Week/15 Days Discharge Instructions: Mixed with half Eucerin or alternative-to bilateral lower legsApply as directed by provider. Topical: Eucerin lotion 3 x Per Week/15 Days Discharge Instructions: Mixed 1/2 with TCA to bilateral lower legs Primary Dressing: Silvercel 4 1/4x 4 1/4 (in/in) 3 x Per Week/15 Days Discharge Instructions: Apply Silvercel 4 1/4x 4 1/4 (in/in) as instructed Secondary Dressing: Zetuvit Plus Silicone Non-bordered 5x5 (in/in) 3 x Per Week/15 Days Compression Wrap: Profore Lite LF 3 Multilayer Compression Bandaging System 3 x Per Week/15 Days Discharge Instructions: Apply 3 multi-layer wrap as prescribed. WOUND #9: - Toe Great Wound Laterality: Right Cleanser: Normal Saline 3 x Per Week/15 Days Discharge Instructions: Wash your hands with soap and water. Remove old dressing, discard into plastic bag and place into trash. Cleanse the wound with Normal Saline prior to applying a clean dressing using gauze sponges, not tissues or cotton balls. Do not scrub or use excessive force. Pat dry using gauze sponges, not tissue or cotton balls. Cleanser: Soap and Water 3 x Per Week/15 Days Discharge Instructions: Gently cleanse wound with antibacterial soap, rinse and pat dry prior to dressing wounds Cleanser: Wound Cleanser 3 x Per Week/15  Days Discharge Instructions: Wash your hands with soap and water. Remove old dressing, discard into plastic bag and place into trash. Cleanse the wound with Wound Cleanser prior to applying a clean dressing using gauze sponges, not tissues or cotton balls. Do not scrub or use excessive force. Pat dry using gauze sponges, not tissue or cotton balls. Topical: Triamcinolone Acetonide Cream, 0.1%, 15 (g) tube 3 x Per Week/15 Days Discharge Instructions: Mixed with half Eucerin or alternative-to bilateral lower legsApply as directed by provider. Topical: Eucerin lotion 3 x Per Week/15 Days Discharge Instructions: Mixed 1/2 with TCA to bilateral lower legs Primary Dressing: Iodosorb 40 (g) 3 x Per Week/15 Days Discharge Instructions: Apply IodoSorb to wound bed only as directed. Secondary Dressing: Zetuvit Plus Silicone Non-bordered 5x5 (in/in) 3 x Per Week/15 Days Compression Wrap: Profore Lite LF 3 Multilayer Compression Bandaging System 3 x Per Week/15 Days Discharge Instructions: Apply 3 multi-layer wrap as prescribed. 1. Aggressive debridement. Culture done 2. Empiric Augmentin 3. He had an x-ray done last month that was negative although imaging may need to be repeated here. 4. I change the primary dressing to silver alginate because of the concern for infection 5. Still using Iodoflex to the 2 wounds on the right including the right first toe and right mid tibia 6. Unable to find his arterial studies from 2 days ago. They are not available to print in the system Electronic Signature(s) [name], [name] ([id number])  Signed: 06/16/2021 3:30:14 PM By: Linton Ham MD Entered By: Linton Ham on 06/16/2021 14:02:00 [name], [name] ([id number]) -------------------------------------------------------------------------------- SuperBill Details Patient Name: [name] Date of Service: 06/16/2021 Medical Record Number: [id number] Patient Account Number: [id number] Date of  Birth/Sex: 1955-11-09 (66 y.o. M) Treating RN: Donnamarie Poag Primary Care Provider: Fulton Reek Other Clinician: Referring Provider: Fulton Reek Treating Provider/Extender: Tito Dine in Treatment: 3 Diagnosis Coding ICD-10 Codes Code Description [id number] Pressure ulcer of left heel, stage 3 E11.621 Type 2 diabetes mellitus with foot ulcer L97.522 Non-pressure chronic ulcer of other part of left foot with fat layer exposed L89.893 Pressure ulcer of other site, stage 3 I87.2 Venous insufficiency (chronic) (peripheral) I50.42 Chronic combined systolic (congestive) and diastolic (congestive) heart failure I10 Essential (primary) hypertension L97.818 Non-pressure chronic ulcer of other part of right lower leg with other specified severity Facility Procedures CPT4 Code: 73428768 Description: 11042 - DEB SUBQ TISSUE 20 SQ CM/< Modifier: Quantity: 1 CPT4 Code: Description: ICD-10 Diagnosis Description L89.623 Pressure ulcer of left heel, stage 3 Modifier: Quantity: Physician Procedures CPT4 Code: 1157262 Description: 11042 - WC PHYS SUBQ TISS 20 SQ CM Modifier: Quantity: 1 CPT4 Code: Description: ICD-10 Diagnosis Description L89.623 Pressure ulcer of left heel, stage 3 Modifier: Quantity: Electronic Signature(s) Signed: 06/16/2021 3:30:14 PM By: Linton Ham MD Signed: 06/16/2021 3:50:58 PM By: Donnamarie Poag Entered ByDonnamarie Poag on 06/16/2021 14:00:47

## 2021-06-17 DIAGNOSIS — L03116 Cellulitis of left lower limb: Secondary | ICD-10-CM | POA: Diagnosis not present

## 2021-06-17 DIAGNOSIS — N400 Enlarged prostate without lower urinary tract symptoms: Secondary | ICD-10-CM | POA: Diagnosis not present

## 2021-06-17 DIAGNOSIS — E11621 Type 2 diabetes mellitus with foot ulcer: Secondary | ICD-10-CM | POA: Diagnosis not present

## 2021-06-17 DIAGNOSIS — G4733 Obstructive sleep apnea (adult) (pediatric): Secondary | ICD-10-CM | POA: Diagnosis not present

## 2021-06-17 DIAGNOSIS — L97821 Non-pressure chronic ulcer of other part of left lower leg limited to breakdown of skin: Secondary | ICD-10-CM | POA: Diagnosis not present

## 2021-06-17 DIAGNOSIS — I110 Hypertensive heart disease with heart failure: Secondary | ICD-10-CM | POA: Diagnosis not present

## 2021-06-17 DIAGNOSIS — E1165 Type 2 diabetes mellitus with hyperglycemia: Secondary | ICD-10-CM | POA: Diagnosis not present

## 2021-06-17 DIAGNOSIS — E1151 Type 2 diabetes mellitus with diabetic peripheral angiopathy without gangrene: Secondary | ICD-10-CM | POA: Diagnosis not present

## 2021-06-17 DIAGNOSIS — I480 Paroxysmal atrial fibrillation: Secondary | ICD-10-CM | POA: Diagnosis not present

## 2021-06-17 DIAGNOSIS — I5033 Acute on chronic diastolic (congestive) heart failure: Secondary | ICD-10-CM | POA: Diagnosis not present

## 2021-06-17 DIAGNOSIS — I2510 Atherosclerotic heart disease of native coronary artery without angina pectoris: Secondary | ICD-10-CM | POA: Diagnosis not present

## 2021-06-17 DIAGNOSIS — L03115 Cellulitis of right lower limb: Secondary | ICD-10-CM | POA: Diagnosis not present

## 2021-06-17 DIAGNOSIS — Z4800 Encounter for change or removal of nonsurgical wound dressing: Secondary | ICD-10-CM | POA: Diagnosis not present

## 2021-06-17 DIAGNOSIS — L89623 Pressure ulcer of left heel, stage 3: Secondary | ICD-10-CM | POA: Diagnosis not present

## 2021-06-17 DIAGNOSIS — L97512 Non-pressure chronic ulcer of other part of right foot with fat layer exposed: Secondary | ICD-10-CM | POA: Diagnosis not present

## 2021-06-20 ENCOUNTER — Other Ambulatory Visit: Payer: Self-pay | Admitting: Family

## 2021-06-20 ENCOUNTER — Encounter: Payer: Self-pay | Admitting: Family

## 2021-06-20 ENCOUNTER — Other Ambulatory Visit
Admission: RE | Admit: 2021-06-20 | Discharge: 2021-06-20 | Disposition: A | Discharge status: Home / Self Care | Payer: Medicare Other | Source: Ambulatory Visit | Attending: Family | Admitting: Family

## 2021-06-20 ENCOUNTER — Ambulatory Visit (HOSPITAL_BASED_OUTPATIENT_CLINIC_OR_DEPARTMENT_OTHER): Payer: Medicare Other | Admitting: Family

## 2021-06-20 ENCOUNTER — Other Ambulatory Visit: Payer: Self-pay

## 2021-06-20 VITALS — BP 117/78 | HR 97 | Resp 20 | Ht 76.0 in | Wt 383.3 lb

## 2021-06-20 DIAGNOSIS — Z87891 Personal history of nicotine dependence: Secondary | ICD-10-CM | POA: Insufficient documentation

## 2021-06-20 DIAGNOSIS — M79671 Pain in right foot: Secondary | ICD-10-CM | POA: Insufficient documentation

## 2021-06-20 DIAGNOSIS — I739 Peripheral vascular disease, unspecified: Secondary | ICD-10-CM

## 2021-06-20 DIAGNOSIS — Z794 Long term (current) use of insulin: Secondary | ICD-10-CM

## 2021-06-20 DIAGNOSIS — I480 Paroxysmal atrial fibrillation: Secondary | ICD-10-CM | POA: Diagnosis not present

## 2021-06-20 DIAGNOSIS — I110 Hypertensive heart disease with heart failure: Secondary | ICD-10-CM | POA: Insufficient documentation

## 2021-06-20 DIAGNOSIS — I890 Lymphedema, not elsewhere classified: Secondary | ICD-10-CM | POA: Insufficient documentation

## 2021-06-20 DIAGNOSIS — J449 Chronic obstructive pulmonary disease, unspecified: Secondary | ICD-10-CM | POA: Insufficient documentation

## 2021-06-20 DIAGNOSIS — Z7901 Long term (current) use of anticoagulants: Secondary | ICD-10-CM | POA: Insufficient documentation

## 2021-06-20 DIAGNOSIS — I5033 Acute on chronic diastolic (congestive) heart failure: Secondary | ICD-10-CM | POA: Diagnosis not present

## 2021-06-20 DIAGNOSIS — I2510 Atherosclerotic heart disease of native coronary artery without angina pectoris: Secondary | ICD-10-CM | POA: Insufficient documentation

## 2021-06-20 DIAGNOSIS — Z7982 Long term (current) use of aspirin: Secondary | ICD-10-CM | POA: Insufficient documentation

## 2021-06-20 DIAGNOSIS — I252 Old myocardial infarction: Secondary | ICD-10-CM | POA: Insufficient documentation

## 2021-06-20 DIAGNOSIS — I4891 Unspecified atrial fibrillation: Secondary | ICD-10-CM | POA: Insufficient documentation

## 2021-06-20 DIAGNOSIS — M79672 Pain in left foot: Secondary | ICD-10-CM | POA: Insufficient documentation

## 2021-06-20 DIAGNOSIS — I5032 Chronic diastolic (congestive) heart failure: Secondary | ICD-10-CM | POA: Insufficient documentation

## 2021-06-20 DIAGNOSIS — I10 Essential (primary) hypertension: Secondary | ICD-10-CM

## 2021-06-20 DIAGNOSIS — E1151 Type 2 diabetes mellitus with diabetic peripheral angiopathy without gangrene: Secondary | ICD-10-CM | POA: Diagnosis not present

## 2021-06-20 DIAGNOSIS — G4730 Sleep apnea, unspecified: Secondary | ICD-10-CM | POA: Insufficient documentation

## 2021-06-20 DIAGNOSIS — E785 Hyperlipidemia, unspecified: Secondary | ICD-10-CM | POA: Insufficient documentation

## 2021-06-20 LAB — BASIC METABOLIC PANEL
Anion gap: 8 (ref 5–15)
BUN: 30 mg/dL — ABNORMAL HIGH (ref 8–23)
CO2: 29 mmol/L (ref 22–32)
Calcium: 8.2 mg/dL — ABNORMAL LOW (ref 8.9–10.3)
Chloride: 99 mmol/L (ref 98–111)
Creatinine, Ser: 1.32 mg/dL — ABNORMAL HIGH (ref 0.61–1.24)
GFR, Estimated: 59 mL/min — ABNORMAL LOW (ref >60)
Glucose, Bld: 255 mg/dL — ABNORMAL HIGH (ref 70–99)
Potassium: 5.1 mmol/L (ref 3.5–5.1)
Sodium: 136 mmol/L (ref 135–145)

## 2021-06-20 MED ORDER — METOLAZONE 2.5 MG PO TABS: 2.5000 mg | ORAL_TABLET | Freq: Every day | ORAL | 0 refills | Status: DC

## 2021-06-20 NOTE — Progress Notes (Signed)
Patient ID: Alec Less., male    DOB: [birth year], 66 y.o.   MRN: [id number]   [name] is a 66 y/o male with a history of atrial fibrillation, asthma, CAD, DM, hyperlipidemia, HTN, depression, gout, hypogonadism, PVD, COPD, pleurisy, sleep apnea, previous tobacco use and chronic heart failure.   Echo report from 05/10/21 reviewed and showed an EF of 50-55% along with moderate LVH  LHC done 02/02/20 and showed: Mid RCA lesion is 90% stenosed. Prox LAD to Mid LAD lesion is 100% stenosed. Mid Cx lesion is 100% stenosed. Origin to Prox Graft lesion is 100% stenosed.  LM normal LAD-100% proxiimal LCx-100% mid RCA-90% mid  Admitted 05/08/21 due to shortness of breath, pedal edema and weeping of fluids.  LLE angiogram showed no significant stenosis. Vascular surgery, wound and cardiology consults obtained. Given IV antibiotics due to cellulitis. Initially given IV lasix with transition to oral diuretics. Bedside debridement of left foot done by podiatry. Discharged after 8 days.  He presents today for a follow up visit with a chief complaint of moderate shortness of breath with minimal exertion. He describes this as chronic in nature having been present for several years, and worsening over the last several days. He has associated fatigue, pedal edema, abdominal edema, tremors, bilateral feet pain and difficulty sleeping along with this. He denies any dizziness, palpitations, chest pain or cough.   Weighing daily, and his weights are consistently within 4 lbs either direction. He is going to the wound clinic [facility] for wound care. He currently has an infection in his heel that he is being treated for with augmentin.   Past Medical History:  Diagnosis Date   Arrhythmia    atrial fibrillation   Asthma    CHF (congestive heart failure) (HCC)    COPD (chronic obstructive pulmonary disease) (HCC)    Coronary artery disease    Depression    Diabetes mellitus without complication (HCC)     Gout    History anabolic steroid use    Hyperlipidemia    Hypertension    Hypogonadism in male    Morbid obesity ([place])    Myocardial infarction ([place])    Peripheral vascular disease ([place])    Perirectal abscess    Pleurisy    Sleep apnea    CPAP at night, no oxygen   Varicella    Past Surgical History:  Procedure Laterality Date   ABDOMINAL AORTIC ANEURYSM REPAIR     ACHILLES TENDON SURGERY Left 01/10/2021   Procedure: ACHILLES LENGTHENING/KIDNER;  Surgeon: Caroline More, DPM;  Location: [facility];  Service: Podiatry;  Laterality: Left;   AMPUTATION TOE Right 02/10/2016   Procedure: AMPUTATION TOE 3RD TOE;  Surgeon: Samara Deist, DPM;  Location: [facility];  Service: Podiatry;  Laterality: Right;   AMPUTATION TOE Left 02/24/2020   Procedure: AMPUTATION TOE;  Surgeon: Caroline More, DPM;  Location: [facility];  Service: Podiatry;  Laterality: Left;   APPLICATION OF WOUND VAC Left 02/29/2020   Procedure: APPLICATION OF WOUND VAC;  Surgeon: Caroline More, DPM;  Location: [facility];  Service: Podiatry;  Laterality: Left;   COLONOSCOPY WITH PROPOFOL [name] 11/18/2015   Procedure: COLONOSCOPY WITH PROPOFOL;  Surgeon: Manya Silvas, MD;  Location: [facility];  Service: Endoscopy;  Laterality: [name];   CORONARY ARTERY BYPASS GRAFT     CORONARY STENT INTERVENTION [name] 02/02/2020   Procedure: CORONARY STENT INTERVENTION;  Surgeon: Isaias Cowman, MD;  Location: [place] [facility];  Service: Cardiovascular;  Laterality: [name];  IRRIGATION AND DEBRIDEMENT FOOT Left 02/29/2020   Procedure: IRRIGATION AND DEBRIDEMENT FOOT;  Surgeon: Caroline More, DPM;  Location: [facility];  Service: Podiatry;  Laterality: Left;   IRRIGATION AND DEBRIDEMENT FOOT Left 02/24/2020   Procedure: IRRIGATION AND DEBRIDEMENT FOOT;  Surgeon: Caroline More, DPM;  Location: [facility];  Service: Podiatry;  Laterality: Left;   KNEE ARTHROSCOPY     LEFT HEART CATH AND CORS/GRAFTS ANGIOGRAPHY [name] 02/02/2020   Procedure: LEFT HEART  CATH AND CORS/GRAFTS ANGIOGRAPHY;  Surgeon: Teodoro Spray, MD;  Location: [place] [facility];  Service: Cardiovascular;  Laterality: [name];   LOWER EXTREMITY ANGIOGRAPHY Left 02/25/2020   Procedure: Lower Extremity Angiography;  Surgeon: Algernon Huxley, MD;  Location: [place] [facility];  Service: Cardiovascular;  Laterality: Left;   LOWER EXTREMITY ANGIOGRAPHY Left 01/04/2021   Procedure: LOWER EXTREMITY ANGIOGRAPHY;  Surgeon: Algernon Huxley, MD;  Location: [place] [facility];  Service: Cardiovascular;  Laterality: Left;   METATARSAL HEAD EXCISION Left 01/10/2021   Procedure: METATARSAL HEAD EXCISION - LEFT 5th;  Surgeon: Caroline More, DPM;  Location: [facility];  Service: Podiatry;  Laterality: Left;   PERIPHERAL VASCULAR CATHETERIZATION Right 01/24/2016   Procedure: Lower Extremity Angiography;  Surgeon: Katha Cabal, MD;  Location: [place] [facility];  Service: Cardiovascular;  Laterality: Right;   PERIPHERAL VASCULAR CATHETERIZATION Right 01/25/2016   Procedure: Lower Extremity Angiography;  Surgeon: Katha Cabal, MD;  Location: [place] [facility];  Service: Cardiovascular;  Laterality: Right;   TOE AMPUTATION     TONSILLECTOMY     History reviewed. No pertinent family history. Social History   Tobacco Use   Smoking status: Former    Packs/day: 0.50    Years: 45.00    Pack years: 22.50    Types: Cigarettes    Quit date: 04/05/2015    Years since quitting: 6.2   Smokeless tobacco: Never  Substance Use Topics   Alcohol use: Not Currently    Alcohol/week: 3.0 standard drinks    Types: 3 Glasses of wine per week   Allergies  Allergen Reactions   Statins     Other reaction(s): Muscle Pain Causes legs to ache per pt   Prior to Admission medications   Medication Sig Start Date End Date Taking? Authorizing Provider  amiodarone (PACERONE) 200 MG tablet Take 1 tablet (200 mg total) by mouth 2 (two) times daily. 05/16/21  Yes Dessa Phi, DO  aspirin EC 81 MG EC tablet  Take 1 tablet (81 mg total) by mouth daily. Swallow whole. 05/17/21  Yes Dessa Phi, DO  digoxin 62.5 MCG TABS Take 0.0625 mg by mouth daily. 01/16/21  Yes Bonnielee Haff, MD  diltiazem (CARDIZEM CD) 300 MG 24 hr capsule Take 1 capsule (300 mg total) by mouth daily. 01/16/21  Yes Bonnielee Haff, MD  ezetimibe (ZETIA) 10 MG tablet Take 10 mg by mouth daily. 12/19/20 12/19/21 Yes [provider]  ferrous sulfate 325 (65 FE) MG tablet Take 325 mg by mouth daily with breakfast.   Yes [provider]  furosemide (LASIX) 20 MG tablet Take 3 tablets (60 mg total) by mouth daily. 05/17/21  Yes Dessa Phi, DO  insulin aspart (NOVOLOG) 100 UNIT/ML injection Inject 9 Units into the skin 3 (three) times daily with meals. Patient taking differently: Inject 15 Units into the skin 3 (three) times daily with meals. SSI extra if BG high from 3 to 15 units 01/16/21  Yes Bonnielee Haff, MD  isosorbide mononitrate (IMDUR) 60 MG  24 hr tablet Take 60 mg by mouth 2 (two) times daily. 04/21/21  Yes [provider]  LANTUS SOLOSTAR 100 UNIT/ML Solostar Pen Inject 50 Units into the skin 2 (two) times daily. Patient taking differently: Inject 40 Units into the skin 2 (two) times daily. 01/16/21  Yes Bonnielee Haff, MD  metFORMIN (GLUCOPHAGE) 1000 MG tablet Take 1,000 mg by mouth 2 (two) times daily.  12/02/17  Yes [provider]  metoprolol tartrate (LOPRESSOR) 50 MG tablet Take 50 mg by mouth 2 (two) times daily. 04/21/21  Yes [provider]  nitroGLYCERIN (NITROSTAT) 0.4 MG SL tablet Place 0.4 mg under the tongue daily as needed. 01/25/20  Yes [provider]  potassium chloride (KLOR-CON) 10 MEQ tablet Take 10 mEq by mouth daily. 03/21/21  Yes [provider]  pramipexole (MIRAPEX) 1 MG tablet Take 2 mg by mouth at bedtime. 11/09/20  Yes [provider]  tamsulosin (FLOMAX) 0.4 MG CAPS capsule Take 0.4 mg by mouth daily.   Yes [provider]   apixaban (ELIQUIS) 5 MG TABS tablet Take 1 tablet (5 mg total) by mouth 2 (two) times daily. Patient not taking: Reported on 05/19/2021 01/26/16   Stegmayer, Joelene Millin A, PA-C  gabapentin (NEURONTIN) 300 MG capsule Take 300 mg by mouth at bedtime. Patient not taking: Reported on 05/19/2021 12/02/20   [provider]  Multiple Vitamins-Minerals (MULTIVITAMIN WITH MINERALS) tablet Take 1 tablet by mouth daily. Patient not taking: Reported on 05/19/2021    [provider]  primidone (MYSOLINE) 250 MG tablet Take 250 mg by mouth 2 (two) times daily. Patient not taking: Reported on 05/19/2021 01/23/20   [provider]  vitamin B-12 (CYANOCOBALAMIN) 1000 MCG tablet Take 10,000 mcg by mouth daily. Energy shot Patient not taking: Reported on 05/19/2021    [provider]  zinc gluconate 50 MG tablet Take 50 mg by mouth daily. Patient not taking: Reported on 05/19/2021    [provider]   Review of Systems  Constitutional:  Positive for fatigue (tire easily). Negative for appetite change.  HENT:  Positive for rhinorrhea. Negative for congestion and postnasal drip.   Eyes: Negative.   Respiratory:  Positive for shortness of breath. Negative for cough.   Cardiovascular:  Positive for leg swelling. Negative for chest pain and palpitations.  Gastrointestinal:  Positive for abdominal distention. Negative for abdominal pain.  Endocrine: Negative.   Genitourinary: Negative.   Musculoskeletal:  Positive for arthralgias (left heel). Negative for back pain.  Skin:  Positive for wound (left heel/ right toe).  Allergic/Immunologic: Negative.   Neurological:  Positive for tremors. Negative for dizziness and light-headedness.  Hematological:  Negative for adenopathy. Bruises/bleeds easily.  Psychiatric/Behavioral:  Positive for sleep disturbance (due to feet pain/ restless leg). Negative for dysphoric mood. The patient is not nervous/anxious.    Vitals:   06/20/21 1333   BP: 117/78  Pulse: 97  Resp: 20  SpO2: 91%  Weight: (!) 383 lb 5 oz (173.9 kg)  Height: 6\' 4"  (1.93 m)   Wt Readings from Last 3 Encounters:  06/20/21 (!) 383 lb 5 oz (173.9 kg)  05/19/21 (!) 379 lb 2 oz (172 kg)  05/14/21 (!) 398 lb 8 oz (180.8 kg)   Lab Results  Component Value Date   CREATININE 1.04 05/16/2021   CREATININE 0.98 05/15/2021   CREATININE 1.12 05/14/2021    Physical Exam Vitals and nursing note reviewed. Exam conducted with a chaperone present (wife).  Constitutional:  Appearance: Normal appearance. He is obese. He is ill-appearing.  HENT:     Head: Normocephalic and atraumatic.     Nose: Rhinorrhea present.  Cardiovascular:     Rate and Rhythm: Normal rate and regular rhythm.  Pulmonary:     Effort: No respiratory distress.     Breath sounds: No wheezing or rales.  Abdominal:     General: There is distension.     Palpations: Abdomen is soft.  Musculoskeletal:     Cervical back: Normal range of motion.     Right lower leg: Edema (4+ pitting at the knee; wrapped in [place] boot) present.     Left lower leg: Edema (4+ pitting at knee; wrapped in UNNA boot) present.  Skin:    General: Skin is warm and dry.  Neurological:     General: No focal deficit present.     Mental Status: He is alert and oriented to person, place, and time.  Psychiatric:        Mood and Affect: Mood normal.        Behavior: Behavior normal.        Thought Content: Thought content normal.    Assessment & Plan:  1: Acute on Chronic heart failure with preserved ejection fraction with structural changes (LVH)- - NYHA class III - fluid overloaded with bilateral pedal edema, panus swelling & weight gain - metolazone 2.5 mg PO x 2 days to start 2/22 am, provided instructions to take 30 min prior to lasix administration - check BMP today - return 2/24 for repeat BMP - weighing daily, weigh today 383, up 4 lbs from last visit - now not adding salt and has been using NoSalt;  admits that he used to use a lot of salt on his food; explained the importance of keeping daily sodium intake to ~ 2000mg / day - drinking much less fluids/day, admits not < 64 ounces but has tremendously decreased his liquid intake - not on GDMT; says that he was on losartan in the past but is unsure of why he was taken off of it - will discuss adding entresto at next visit - may not be a good candidate for SGLT2 due to body habitus and frequent fungal infections around abdominal folds - does wear his CPAP nightly - BNP 05/09/21 was 531.5  2: HTN- - BP 117/78 - saw PCP ([facility]) 06/02/21 - BMP 05/16/21 reviewed and showed sodium 135, potassium 4.4, creatinine 1.04 and GFR >60  3: DM- - saw endocrinology [facility]) 04/28/21; returns 09/29/21 - A1c 05/09/21 was 9.2%  4: Atrial fibrillation- - will see [facility] on 08/09/21  5: PAD/ lymphedema- - currently both legs are wrapped in UNNA boots to the knees and getting changed by home health nurse - says that the swelling is some better in the mornings after his legs have been elevated   Medication list reviewed.   Return in 2 weeks, sooner if needed.

## 2021-06-20 NOTE — Patient Instructions (Addendum)
Continue to weigh daily.   Metolazone 2.5 mg by mouth will be sent to your pharmacy.  Take this medication for 2 days, 30 minutes prior to taking your lasix.  Record your weights on you paper.  Return Friday around 12 to get your lab work checked.

## 2021-06-21 ENCOUNTER — Telehealth: Payer: Self-pay

## 2021-06-21 DIAGNOSIS — L97522 Non-pressure chronic ulcer of other part of left foot with fat layer exposed: Secondary | ICD-10-CM | POA: Diagnosis not present

## 2021-06-21 DIAGNOSIS — I739 Peripheral vascular disease, unspecified: Secondary | ICD-10-CM | POA: Diagnosis not present

## 2021-06-21 DIAGNOSIS — E1142 Type 2 diabetes mellitus with diabetic polyneuropathy: Secondary | ICD-10-CM | POA: Diagnosis not present

## 2021-06-21 DIAGNOSIS — L97512 Non-pressure chronic ulcer of other part of right foot with fat layer exposed: Secondary | ICD-10-CM | POA: Diagnosis not present

## 2021-06-21 LAB — AEROBIC CULTURE W GRAM STAIN (SUPERFICIAL SPECIMEN)

## 2021-06-21 NOTE — Telephone Encounter (Addendum)
Patient called with below lab results. He acknowledged his results and stated he will keep his appointment for lab work on Friday. Georg Ruddle, RN ----- Message from Alisa Graff, [place] sent at 06/21/2021 10:03 AM EST ----- Kidney function is a little worse but this could be due to the fluid buildup you currently have. Potassium is normal. We will be rechecking all of this on Friday when you get your labs drawn.

## 2021-06-22 DIAGNOSIS — L03116 Cellulitis of left lower limb: Secondary | ICD-10-CM | POA: Diagnosis not present

## 2021-06-22 DIAGNOSIS — E1151 Type 2 diabetes mellitus with diabetic peripheral angiopathy without gangrene: Secondary | ICD-10-CM | POA: Diagnosis not present

## 2021-06-22 DIAGNOSIS — L97821 Non-pressure chronic ulcer of other part of left lower leg limited to breakdown of skin: Secondary | ICD-10-CM | POA: Diagnosis not present

## 2021-06-22 DIAGNOSIS — L89623 Pressure ulcer of left heel, stage 3: Secondary | ICD-10-CM | POA: Diagnosis not present

## 2021-06-23 ENCOUNTER — Telehealth: Payer: Self-pay | Admitting: Family

## 2021-06-23 ENCOUNTER — Encounter: Payer: Medicare Other | Admitting: Physician Assistant

## 2021-06-23 ENCOUNTER — Other Ambulatory Visit: Payer: Self-pay

## 2021-06-23 ENCOUNTER — Other Ambulatory Visit
Admission: RE | Admit: 2021-06-23 | Discharge: 2021-06-23 | Disposition: A | Discharge status: Home / Self Care | Payer: Medicare Other | Source: Ambulatory Visit | Attending: Family | Admitting: Family

## 2021-06-23 DIAGNOSIS — S81801D Unspecified open wound, right lower leg, subsequent encounter: Secondary | ICD-10-CM | POA: Diagnosis not present

## 2021-06-23 DIAGNOSIS — I83001 Varicose veins of unspecified lower extremity with ulcer of thigh: Secondary | ICD-10-CM | POA: Diagnosis not present

## 2021-06-23 DIAGNOSIS — L97812 Non-pressure chronic ulcer of other part of right lower leg with fat layer exposed: Secondary | ICD-10-CM | POA: Diagnosis not present

## 2021-06-23 DIAGNOSIS — Z01812 Encounter for preprocedural laboratory examination: Secondary | ICD-10-CM | POA: Insufficient documentation

## 2021-06-23 DIAGNOSIS — I890 Lymphedema, not elsewhere classified: Secondary | ICD-10-CM | POA: Diagnosis not present

## 2021-06-23 DIAGNOSIS — L89893 Pressure ulcer of other site, stage 3: Secondary | ICD-10-CM | POA: Diagnosis not present

## 2021-06-23 DIAGNOSIS — M1990 Unspecified osteoarthritis, unspecified site: Secondary | ICD-10-CM | POA: Diagnosis not present

## 2021-06-23 DIAGNOSIS — I110 Hypertensive heart disease with heart failure: Secondary | ICD-10-CM | POA: Diagnosis not present

## 2021-06-23 DIAGNOSIS — L89623 Pressure ulcer of left heel, stage 3: Secondary | ICD-10-CM | POA: Diagnosis not present

## 2021-06-23 DIAGNOSIS — I5042 Chronic combined systolic (congestive) and diastolic (congestive) heart failure: Secondary | ICD-10-CM | POA: Diagnosis not present

## 2021-06-23 DIAGNOSIS — I872 Venous insufficiency (chronic) (peripheral): Secondary | ICD-10-CM | POA: Diagnosis not present

## 2021-06-23 DIAGNOSIS — L97422 Non-pressure chronic ulcer of left heel and midfoot with fat layer exposed: Secondary | ICD-10-CM | POA: Diagnosis not present

## 2021-06-23 DIAGNOSIS — L97818 Non-pressure chronic ulcer of other part of right lower leg with other specified severity: Secondary | ICD-10-CM | POA: Diagnosis not present

## 2021-06-23 DIAGNOSIS — E11621 Type 2 diabetes mellitus with foot ulcer: Secondary | ICD-10-CM | POA: Diagnosis not present

## 2021-06-23 DIAGNOSIS — Z794 Long term (current) use of insulin: Secondary | ICD-10-CM | POA: Diagnosis not present

## 2021-06-23 DIAGNOSIS — L97522 Non-pressure chronic ulcer of other part of left foot with fat layer exposed: Secondary | ICD-10-CM | POA: Diagnosis not present

## 2021-06-23 LAB — BASIC METABOLIC PANEL
Anion gap: 7 (ref 5–15)
BUN: 23 mg/dL (ref 8–23)
CO2: 32 mmol/L (ref 22–32)
Calcium: 8.1 mg/dL — ABNORMAL LOW (ref 8.9–10.3)
Chloride: 98 mmol/L (ref 98–111)
Creatinine, Ser: 1.24 mg/dL (ref 0.61–1.24)
GFR, Estimated: >60 mL/min (ref >60)
Glucose, Bld: 214 mg/dL — ABNORMAL HIGH (ref 70–99)
Potassium: 4.4 mmol/L (ref 3.5–5.1)
Sodium: 137 mmol/L (ref 135–145)

## 2021-06-23 MED ORDER — METOLAZONE 2.5 MG PO TABS: 2.5000 mg | ORAL_TABLET | Freq: Every day | ORAL | 0 refills | Status: DC

## 2021-06-23 NOTE — Telephone Encounter (Signed)
Called patient and spoke to wife regarding patient's labs after taking 2 doses of metolazone 2.5mg  daily for 2 days. Kidney function has improved slightly even with metolazone and potassium/sodium levels are normal.   Both patient and wife note that legs are still swollen but patient says that his breathing had improved some while taking the metolazone. Noted that he urinated for a longer period of time on the days that he took the metolazone.   Based on his symptoms and that his lab work was normal, will give an additional 3 doses of metolazone 2.5mg . He will take 1 tablet every morning prior to his morning furosemide. If he feels markedly better after another 2 doses, he does not have to take the 3rd dose.   We will check lab work on Tuesday, 06/27/21 to re-evaluate kidney function. Wife verbalized understanding.   She also mentions that [facility] will be starting next week

## 2021-06-23 NOTE — Progress Notes (Addendum)
RADEN, BYINGTON ([id number]) Visit Report for 06/23/2021 Chief Complaint Document Details Patient Name: [name], [name] Date of Service: 06/23/2021 1:00 PM Medical Record Number: [id number] Patient Account Number: [id number] Date of Birth/Sex: 11-23-1955 (66 y.o. M) Treating RN: Donnamarie Poag Primary Care Provider: Fulton Reek Other Clinician: Referring Provider: Fulton Reek Treating Provider/Extender: Skipper Cliche in Treatment: 4 Information Obtained from: Patient Chief Complaint Bilateral LE Ulcers Electronic Signature(s) Signed: 06/23/2021 1:49:48 PM By: Worthy Keeler PA-C Entered By: Worthy Keeler on 06/23/2021 13:49:48 [name] ([id number]) -------------------------------------------------------------------------------- HPI Details Patient Name: [name], [name] Date of Service: 06/23/2021 1:00 PM Medical Record Number: [id number] Patient Account Number: [id number] Date of Birth/Sex: 01-10-1956 (66 y.o. M) Treating RN: Donnamarie Poag Primary Care Provider: Fulton Reek Other Clinician: Referring Provider: Fulton Reek Treating Provider/Extender: Skipper Cliche in Treatment: 4 History of Present Illness HPI Description: Admission 8/31 [name] is a 66 year old male with a past medical history of uncontrolled insulin-dependent type 2 diabetes, CAD, osteomyelitis to the left third toe status post partial third ray amputation, and peripheral arterial disease that presents for a left plantar foot wound and bilateral lower extremity wounds. The left plantar foot wound has been present for at least 7 months. He has been following podiatry for this issue. He uses silver collagen to this area. He reports that in the past 1 to 2 months he has developed multiple scattered open wounds to his lower extremities bilaterally that have worsened over time. He states that they often develop as either blisters or he hits his leg against an object.He  is currently using Betadine to these areas. He denies signs of infection. He has compression stockings however he cannot put these on very easily. Also he is on his feet most of the day. He states he does not have anyone to help him do the activities he needs to get done. Readmission: 05/26/2021 upon evaluation today patient presents for reevaluation here in the clinic concerning issues he is been having with his bilateral lower extremities, his right great toe, and left heel primarily. With that being said he has been in the hospital recently and his hemoglobin A1c on January 10 was 9.2. With that being said he does have a lot of really thickened dry skin attached to his legs when he try to remove that today. He is in agreement with the plan. Nonetheless I do believe that based on what we are seeing he is been having a lot of issues with his congestive heart failure I do have him on fluid pills he does go to the bathroom quite a bit. Subsequently has been in [employer] but I do not think that is doing quite enough to help control his edema. He also needs something better than Santyl to put on the heel right now when he was in the hospital they actually cut a space open in the compression around his heel so they can open it up and actually apply the Santyl daily. I think he needs something better than that. Again the patient does have a history of diabetes mellitus type 2, chronic venous insufficiency, hypertension, and congestive heart failure. He also has signs of stage III lymphedema as well. 06/02/2021 upon evaluation today patient appears to be doing decently well in regard to his wounds. Fortunately I do not see any signs of active infection at this time which is great news. Fortunately I think that he is making excellent progress. Nonetheless I do think  that we seem to be making some progress here. 06/09/2021 upon evaluation today patient appears to be doing well with regard to his legs all  things considered unfortunately the left heel is not doing nearly as well as what I like to see. There is a lot of necrotic tissue this is very boggy. I think that it is going to require potentially much more debridement but again we really need to get the results of the arterial testing done and this is actually scheduled for the 15th. 2/17; arrives in clinic today with the left plantar heel wound completely necrotic and malodorous. Comparing the picture with last week this looks quite a bit worse. He has been using Iodoflex under compression. He has an area on the right first toe which seems clean and better and a new area on the right anterior mid Tibia. I was unable to find the results of his arterial studies that he had done on 1/15. Presumably they have not been read. I can see that they have been done 06/23/2021 upon evaluation today patient appears to be doing well currently in regard to his legs. I am actually very pleased in that regard. Fortunately I do not see any signs of active infection at this time. I have reviewed his arterial study and it appeared to be pretty good with TBI's on the left of 0.88 on the right 0.87. Overall I think that we are definitely headed in the right direction which is great news. Fortunately I do not see any signs of active infection locally or systemically at this time. Electronic Signature(s) Signed: 06/23/2021 2:27:35 PM By: Worthy Keeler PA-C Entered By: Worthy Keeler on 06/23/2021 14:27:35 [name], [name] ([id number]) -------------------------------------------------------------------------------- Physical Exam Details Patient Name: [name], [name] Date of Service: 06/23/2021 1:00 PM Medical Record Number: [id number] Patient Account Number: [id number] Date of Birth/Sex: Jan 13, 1956 (66 y.o. M) Treating RN: Donnamarie Poag Primary Care Provider: Fulton Reek Other Clinician: Referring Provider: Fulton Reek Treating Provider/Extender:  Skipper Cliche in Treatment: 4 Constitutional Well-nourished and well-hydrated in no acute distress. Respiratory normal breathing without difficulty. Psychiatric this patient is able to make decisions and demonstrates good insight into disease process. Alert and Oriented x 3. pleasant and cooperative. Notes Upon inspection patient's wounds appear to be doing good pretty much across the board. The wound cavity is the heel on the left which is still showing signs of significant necrotic tissue. Fortunately I do not see any evidence of active infection locally nor systemically at this time which is great news. Electronic Signature(s) Signed: 06/23/2021 2:28:26 PM By: Worthy Keeler PA-C Entered By: Worthy Keeler on 06/23/2021 14:28:26 [name], [name] ([id number]) -------------------------------------------------------------------------------- Physician Orders Details Patient Name: Alec Schooner A. Date of Service: 06/23/2021 1:00 PM Medical Record Number: [id number] Patient Account Number: [id number] Date of Birth/Sex: 02/28/1956 (66 y.o. M) Treating RN: Donnamarie Poag Primary Care Provider: Fulton Reek Other Clinician: Referring Provider: Fulton Reek Treating Provider/Extender: Skipper Cliche in Treatment: 4 Verbal / Phone Orders: No Diagnosis Coding ICD-10 Coding Code Description [phone] Pressure ulcer of left heel, stage 3 E11.621 Type 2 diabetes mellitus with foot ulcer L97.522 Non-pressure chronic ulcer of other part of left foot with fat layer exposed L89.893 Pressure ulcer of other site, stage 3 I87.2 Venous insufficiency (chronic) (peripheral) I50.42 Chronic combined systolic (congestive) and diastolic (congestive) heart failure I10 Essential (primary) hypertension L97.818 Non-pressure chronic ulcer of other part of right lower leg with other specified severity Follow-up Appointments o Return  Appointment in 1 week. o Nurse Visit as needed [facility] for wound care. May utilize formulary equivalent dressing for wound treatment orders unless otherwise specified. Home Health Nurse may visit PRN to address patientos wound care needs. o Scheduled days for dressing changes to be completed; exception, patient has scheduled wound care visit that day. - HH to wrap twice weekly and at wound center one time unless he is unable to have appt at wound center that week o **Please direct any NON-WOUND related issues/requests for orders to patient's Primary Care Physician. **If current dressing causes regression in wound condition, may D/C ordered dressing product/s and apply Normal Saline Moist Dressing daily until next [facility] or Other MD appointment. **Notify Wound Healing Center of regression in wound condition at [phone]. Bathing/ Shower/ Hygiene o May shower with wound dressing protected with water repellent cover or cast protector. o No tub bath. Anesthetic (Use 'Patient Medications' Section for Anesthetic Order Entry) o Lidocaine applied to wound bed Edema Control - Lymphedema / Segmental Compressive Device / Other o Optional: One layer of unna paste to top of compression wrap (to act as an anchor). o Elevate legs to the level of the heart and pump ankles as often as possible o Elevate leg(s) parallel to the floor when sitting. o DO YOUR BEST to sleep in the bed at night. DO NOT sleep in your recliner. Long hours of sitting in a recliner leads to swelling of the legs and/or potential wounds on your backside. Non-Wound Condition o Additional non-wound orders/instructions: - Triamcinolone mixed with lotion to bilateral lower legs gauze between toes to keep dry; bandaid to cover newly healed skin to toe Off-Loading o Open toe surgical shoe with peg assist. - L heel wedge shoe Recommend knee scooter o Turn and reposition every 2 hours - keep  pressure off of left heel Additional Orders / Instructions o Follow Nutritious Diet and Increase Protein Intake - monitor blood sugar Medications-Please add to medication list. o P.O. Antibiotics - Pick up extended amount of antibiotic and take as directed for the heel wound Advani, Dejan A. ([id number]) Wound Treatment Wound #11 - Lower Leg Wound Laterality: Right Cleanser: Normal Saline 3 x Per Week/15 Days Discharge Instructions: Wash your hands with soap and water. Remove old dressing, discard into plastic bag and place into trash. Cleanse the wound with Normal Saline prior to applying a clean dressing using gauze sponges, not tissues or cotton balls. Do not scrub or use excessive force. Pat dry using gauze sponges, not tissue or cotton balls. Cleanser: Soap and Water 3 x Per Week/15 Days Discharge Instructions: Gently cleanse wound with antibacterial soap, rinse and pat dry prior to dressing wounds Cleanser: Wound Cleanser 3 x Per Week/15 Days Discharge Instructions: Wash your hands with soap and water. Remove old dressing, discard into plastic bag and place into trash. Cleanse the wound with Wound Cleanser prior to applying a clean dressing using gauze sponges, not tissues or cotton balls. Do not scrub or use excessive force. Pat dry using gauze sponges, not tissue or cotton balls. Topical: Triamcinolone Acetonide Cream, 0.1%, 15 (g) tube 3 x Per Week/15 Days Discharge Instructions: Mixed with half Eucerin or alternative-to bilateral lower legsApply as directed by provider. Topical: Eucerin lotion 3 x Per Week/15 Days Discharge Instructions: Mixed 1/2 with TCA to bilateral lower legs Primary Dressing: Iodosorb 40 (g) 3 x Per Week/15 Days Discharge Instructions: Apply IodoSorb to wound  bed only as directed. Secondary Dressing: Zetuvit Plus Silicone Non-bordered 5x5 (in/in) 3 x Per Week/15 Days Compression Wrap: Profore Lite LF 3 Multilayer Compression Bandaging System 3 x Per  Week/15 Days Discharge Instructions: Apply 3 multi-layer wrap as prescribed. Compression Stockings: Circaid Juxta Lite Compression Wrap (DME) Right Leg Compression Amount: 30-40 mmHG Discharge Instructions: Apply Circaid Juxta Lite Compression Wrap as directed Wound #7 - Calcaneus Wound Laterality: Left Cleanser: Normal Saline 3 x Per Week/15 Days Discharge Instructions: Wash your hands with soap and water. Remove old dressing, discard into plastic bag and place into trash. Cleanse the wound with Normal Saline prior to applying a clean dressing using gauze sponges, not tissues or cotton balls. Do not scrub or use excessive force. Pat dry using gauze sponges, not tissue or cotton balls. Cleanser: Soap and Water 3 x Per Week/15 Days Discharge Instructions: Gently cleanse wound with antibacterial soap, rinse and pat dry prior to dressing wounds Cleanser: Wound Cleanser 3 x Per Week/15 Days Discharge Instructions: Wash your hands with soap and water. Remove old dressing, discard into plastic bag and place into trash. Cleanse the wound with Wound Cleanser prior to applying a clean dressing using gauze sponges, not tissues or cotton balls. Do not scrub or use excessive force. Pat dry using gauze sponges, not tissue or cotton balls. Topical: Triamcinolone Acetonide Cream, 0.1%, 15 (g) tube 3 x Per Week/15 Days Discharge Instructions: Mixed with half Eucerin or alternative-to bilateral lower legsApply as directed by provider. Topical: Eucerin lotion 3 x Per Week/15 Days Discharge Instructions: Mixed 1/2 with TCA to bilateral lower legs Primary Dressing: Iodosorb 40 (g) 3 x Per Week/15 Days Discharge Instructions: Apply IodoSorb to wound bed only as directed. Secondary Dressing: Zetuvit Plus Silicone Non-bordered 5x5 (in/in) 3 x Per Week/15 Days Compression Wrap: Profore Lite LF 3 Multilayer Compression Bandaging System 3 x Per Week/15 Days Discharge Instructions: Apply 3 multi-layer wrap as  prescribed. Wound #9 - Toe Great Wound Laterality: Right Cleanser: Normal Saline 3 x Per Week/15 Days Discharge Instructions: Wash your hands with soap and water. Remove old dressing, discard into plastic bag and place into trash. Cleanse the wound with Normal Saline prior to applying a clean dressing using gauze sponges, not tissues or cotton balls. Do not scrub or use excessive force. Pat dry using gauze sponges, not tissue or cotton balls. [name], [name] ([id number]) Cleanser: Soap and Water 3 x Per Week/15 Days Discharge Instructions: Gently cleanse wound with antibacterial soap, rinse and pat dry prior to dressing wounds Cleanser: Wound Cleanser 3 x Per Week/15 Days Discharge Instructions: Wash your hands with soap and water. Remove old dressing, discard into plastic bag and place into trash. Cleanse the wound with Wound Cleanser prior to applying a clean dressing using gauze sponges, not tissues or cotton balls. Do not scrub or use excessive force. Pat dry using gauze sponges, not tissue or cotton balls. Topical: Triamcinolone Acetonide Cream, 0.1%, 15 (g) tube 3 x Per Week/15 Days Discharge Instructions: Mixed with half Eucerin or alternative-to bilateral lower legsApply as directed by provider. Topical: Eucerin lotion 3 x Per Week/15 Days Discharge Instructions: Mixed 1/2 with TCA to bilateral lower legs Primary Dressing: Iodosorb 40 (g) 3 x Per Week/15 Days Discharge Instructions: Apply IodoSorb to wound bed only as directed. Secondary Dressing: Zetuvit Plus Silicone Non-bordered 5x5 (in/in) 3 x Per Week/15 Days Compression Wrap: Profore Lite LF 3 Multilayer Compression Bandaging System 3 x Per Week/15 Days Discharge Instructions: Apply 3 multi-layer wrap as prescribed. Patient  Medications Allergies: No Known Allergies Notifications Medication Indication Start End Augmentin 06/26/2021 DOSE 1 - oral 875 mg-125 mg tablet - 1 tablet oral taken 2 times per day for 14  days. Electronic Signature(s) Signed: 06/26/2021 8:26:41 AM By: Worthy Keeler PA-C Previous Signature: 06/23/2021 4:05:24 PM Version By: Worthy Keeler PA-C Previous Signature: 06/23/2021 4:55:51 PM Version By: Donnamarie Poag Entered By: Worthy Keeler on 06/26/2021 08:26:40 [name], [name] ([id number]) -------------------------------------------------------------------------------- Problem List Details Patient Name: [name], [name] Date of Service: 06/23/2021 1:00 PM Medical Record Number: [id number] Patient Account Number: [id number] Date of Birth/Sex: 1955/07/30 (66 y.o. M) Treating RN: Donnamarie Poag Primary Care Provider: Fulton Reek Other Clinician: Referring Provider: Fulton Reek Treating Provider/Extender: Skipper Cliche in Treatment: 4 Active Problems ICD-10 Encounter Code Description Active Date MDM Diagnosis [phone] Pressure ulcer of left heel, stage 3 05/26/2021 No Yes E11.621 Type 2 diabetes mellitus with foot ulcer 05/26/2021 No Yes L97.522 Non-pressure chronic ulcer of other part of left foot with fat layer 05/26/2021 No Yes exposed L89.893 Pressure ulcer of other site, stage 3 05/26/2021 No Yes I87.2 Venous insufficiency (chronic) (peripheral) 05/26/2021 No Yes I50.42 Chronic combined systolic (congestive) and diastolic (congestive) heart 05/26/2021 No Yes failure I10 Essential (primary) hypertension 05/26/2021 No Yes L97.818 Non-pressure chronic ulcer of other part of right lower leg with other 06/16/2021 No Yes specified severity Inactive Problems Resolved Problems Electronic Signature(s) Signed: 06/23/2021 1:49:42 PM By: Worthy Keeler PA-C Entered By: Worthy Keeler on 06/23/2021 13:49:42 [name], [name] ([id number]) -------------------------------------------------------------------------------- Progress Note Details Patient Name: [name], [name] Date of Service: 06/23/2021 1:00 PM Medical Record Number: [id number] Patient Account Number:  [id number] Date of Birth/Sex: [birth year] (66 y.o. M) Treating RN: Donnamarie Poag Primary Care Provider: Fulton Reek Other Clinician: Referring Provider: Fulton Reek Treating Provider/Extender: Skipper Cliche in Treatment: 4 Subjective Chief Complaint Information obtained from Patient Bilateral LE Ulcers History of Present Illness (HPI) Admission 8/31 Mr. gaylen venning is a 66 year old male with a past medical history of uncontrolled insulin-dependent type 2 diabetes, CAD, osteomyelitis to the left third toe status post partial third ray amputation, and peripheral arterial disease that presents for a left plantar foot wound and bilateral lower extremity wounds. The left plantar foot wound has been present for at least 7 months. He has been following podiatry for this issue. He uses silver collagen to this area. He reports that in the past 1 to 2 months he has developed multiple scattered open wounds to his lower extremities bilaterally that have worsened over time. He states that they often develop as either blisters or he hits his leg against an object.He is currently using Betadine to these areas. He denies signs of infection. He has compression stockings however he cannot put these on very easily. Also he is on his feet most of the day. He states he does not have anyone to help him do the activities he needs to get done. Readmission: 05/26/2021 upon evaluation today patient presents for reevaluation here in the clinic concerning issues he is been having with his bilateral lower extremities, his right great toe, and left heel primarily. With that being said he has been in the hospital recently and his hemoglobin A1c on January 10 was 9.2. With that being said he does have a lot of really thickened dry skin attached to his legs when he try to remove that today. He is in agreement with the plan. Nonetheless I do believe that based on what we are seeing  he is been having a lot of issues  with his congestive heart failure I do have him on fluid pills he does go to the bathroom quite a bit. Subsequently has been in [employer] but I do not think that is doing quite enough to help control his edema. He also needs something better than Santyl to put on the heel right now when he was in the hospital they actually cut a space open in the compression around his heel so they can open it up and actually apply the Santyl daily. I think he needs something better than that. Again the patient does have a history of diabetes mellitus type 2, chronic venous insufficiency, hypertension, and congestive heart failure. He also has signs of stage III lymphedema as well. 06/02/2021 upon evaluation today patient appears to be doing decently well in regard to his wounds. Fortunately I do not see any signs of active infection at this time which is great news. Fortunately I think that he is making excellent progress. Nonetheless I do think that we seem to be making some progress here. 06/09/2021 upon evaluation today patient appears to be doing well with regard to his legs all things considered unfortunately the left heel is not doing nearly as well as what I like to see. There is a lot of necrotic tissue this is very boggy. I think that it is going to require potentially much more debridement but again we really need to get the results of the arterial testing done and this is actually scheduled for the 15th. 2/17; arrives in clinic today with the left plantar heel wound completely necrotic and malodorous. Comparing the picture with last week this looks quite a bit worse. He has been using Iodoflex under compression. He has an area on the right first toe which seems clean and better and a new area on the right anterior mid Tibia. I was unable to find the results of his arterial studies that he had done on 1/15. Presumably they have not been read. I can see that they have been done 06/23/2021 upon  evaluation today patient appears to be doing well currently in regard to his legs. I am actually very pleased in that regard. Fortunately I do not see any signs of active infection at this time. I have reviewed his arterial study and it appeared to be pretty good with TBI's on the left of 0.88 on the right 0.87. Overall I think that we are definitely headed in the right direction which is great news. Fortunately I do not see any signs of active infection locally or systemically at this time. Objective Constitutional Well-nourished and well-hydrated in no acute distress. Vitals Time Taken: 1:09 PM, Height: 73 in, Weight: 371 lbs, BMI: 48.9, Temperature: 98.2 F, Pulse: 102 bpm, Respiratory Rate: 16 breaths/min, Blood Pressure: 128/79 mmHg. Respiratory [name], [name] ([id number]) normal breathing without difficulty. Psychiatric this patient is able to make decisions and demonstrates good insight into disease process. Alert and Oriented x 3. pleasant and cooperative. General Notes: Upon inspection patient's wounds appear to be doing good pretty much across the board. The wound cavity is the heel on the left which is still showing signs of significant necrotic tissue. Fortunately I do not see any evidence of active infection locally nor systemically at this time which is great news. Integumentary (Hair, Skin) Wound #11 status is Open. Original cause of wound was Gradually Appeared. The date acquired was: 06/16/2021. The wound has been in treatment 1  weeks. The wound is located on the Right Lower Leg. The wound measures 0.5cm length x 0.6cm width x 0.1cm depth; 0.236cm^2 area and 0.024cm^3 volume. There is Fat Layer (Subcutaneous Tissue) exposed. There is no tunneling or undermining noted. There is a medium amount of serosanguineous drainage noted. There is large (67-100%) red, pale granulation within the wound bed. There is a small (1-33%) amount of necrotic tissue within the wound bed  including Adherent Slough. Wound #7 status is Open. Original cause of wound was Gradually Appeared. The date acquired was: 05/05/2021. The wound has been in treatment 4 weeks. The wound is located on the Left Calcaneus. The wound measures 2.8cm length x 4.3cm width x 1.5cm depth; 9.456cm^2 area and 14.184cm^3 volume. There is Fat Layer (Subcutaneous Tissue) exposed. There is no tunneling noted, however, there is undermining starting at 10:00 and ending at 1:00 with a maximum distance of 0.8cm. There is a large amount of serosanguineous drainage noted. The wound margin is thickened. There is small (1-33%) pink granulation within the wound bed. There is a large (67-100%) amount of necrotic tissue within the wound bed including Eschar and Adherent Slough. Wound #9 status is Open. Original cause of wound was Gradually Appeared. The date acquired was: 04/26/2021. The wound has been in treatment 4 weeks. The wound is located on the Right Toe Great. The wound measures 0.7cm length x 0.8cm width x 0.2cm depth; 0.44cm^2 area and 0.088cm^3 volume. There is Fat Layer (Subcutaneous Tissue) exposed. There is no tunneling or undermining noted. There is a medium amount of serosanguineous drainage noted. There is small (1-33%) red, pink granulation within the wound bed. There is a large (67-100%) amount of necrotic tissue within the wound bed including Adherent Slough. Assessment Active Problems ICD-10 Pressure ulcer of left heel, stage 3 Type 2 diabetes mellitus with foot ulcer Non-pressure chronic ulcer of other part of left foot with fat layer exposed Pressure ulcer of other site, stage 3 Venous insufficiency (chronic) (peripheral) Chronic combined systolic (congestive) and diastolic (congestive) heart failure Essential (primary) hypertension Non-pressure chronic ulcer of other part of right lower leg with other specified severity Procedures Wound #11 Pre-procedure diagnosis of Wound #11 is a Venous Leg  Ulcer located on the Right Lower Leg . There was a Three Layer Compression Therapy Procedure by Donnamarie Poag, RN. Post procedure Diagnosis Wound #11: Same as Pre-Procedure Wound #7 Pre-procedure diagnosis of Wound #7 is a Pressure Ulcer located on the Left Calcaneus . There was a Three Layer Compression Therapy Procedure by Donnamarie Poag, RN. Post procedure Diagnosis Wound #7: Same as Pre-Procedure Plan Follow-up Appointments: Return Appointment in 1 week. Nurse Visit as needed [name], [name] ([id number]) Home Health: [place]: - [facility] for wound care. May utilize formulary equivalent dressing for wound treatment orders unless otherwise specified. Home Health Nurse may visit PRN to address patient s wound care needs. Scheduled days for dressing changes to be completed; exception, patient has scheduled wound care visit that day. - HH to wrap twice weekly and at wound center one time unless he is unable to have appt at wound center that week **Please direct any NON-WOUND related issues/requests for orders to patient's Primary Care Physician. **If current dressing causes regression in wound condition, may D/C ordered dressing product/s and apply Normal Saline Moist Dressing daily until next [place] or Other MD appointment. **Notify Wound Healing Center of regression in wound condition at [phone]. Bathing/ Shower/ Hygiene: May shower with wound dressing protected  with water repellent cover or cast protector. No tub bath. Anesthetic (Use 'Patient Medications' Section for Anesthetic Order Entry): Lidocaine applied to wound bed Edema Control - Lymphedema / Segmental Compressive Device / Other: Optional: One layer of unna paste to top of compression wrap (to act as an anchor). Elevate legs to the level of the heart and pump ankles as often as possible Elevate leg(s) parallel to the floor when sitting. DO YOUR BEST to sleep in the bed at night.  DO NOT sleep in your recliner. Long hours of sitting in a recliner leads to swelling of the legs and/or potential wounds on your backside. Non-Wound Condition: Additional non-wound orders/instructions: - Triamcinolone mixed with lotion to bilateral lower legs gauze between toes to keep dry; bandaid to cover newly healed skin to toe Off-Loading: Open toe surgical shoe with peg assist. - L heel wedge shoe Recommend knee scooter Turn and reposition every 2 hours - keep pressure off of left heel Additional Orders / Instructions: Follow Nutritious Diet and Increase Protein Intake - monitor blood sugar Medications-Please add to medication list.: P.O. Antibiotics - Pick up extended amount of antibiotic and take as directed for the heel wound The following medication(s) was prescribed: Augmentin oral 875 mg-125 mg tablet 1 1 tablet oral taken 2 times per day for 14 days. starting 06/26/2021 WOUND #11: - Lower Leg Wound Laterality: Right Cleanser: Normal Saline 3 x Per Week/15 Days Discharge Instructions: Wash your hands with soap and water. Remove old dressing, discard into plastic bag and place into trash. Cleanse the wound with Normal Saline prior to applying a clean dressing using gauze sponges, not tissues or cotton balls. Do not scrub or use excessive force. Pat dry using gauze sponges, not tissue or cotton balls. Cleanser: Soap and Water 3 x Per Week/15 Days Discharge Instructions: Gently cleanse wound with antibacterial soap, rinse and pat dry prior to dressing wounds Cleanser: Wound Cleanser 3 x Per Week/15 Days Discharge Instructions: Wash your hands with soap and water. Remove old dressing, discard into plastic bag and place into trash. Cleanse the wound with Wound Cleanser prior to applying a clean dressing using gauze sponges, not tissues or cotton balls. Do not scrub or use excessive force. Pat dry using gauze sponges, not tissue or cotton balls. Topical: Triamcinolone Acetonide Cream,  0.1%, 15 (g) tube 3 x Per Week/15 Days Discharge Instructions: Mixed with half Eucerin or alternative-to bilateral lower legsApply as directed by provider. Topical: Eucerin lotion 3 x Per Week/15 Days Discharge Instructions: Mixed 1/2 with TCA to bilateral lower legs Primary Dressing: Iodosorb 40 (g) 3 x Per Week/15 Days Discharge Instructions: Apply IodoSorb to wound bed only as directed. Secondary Dressing: Zetuvit Plus Silicone Non-bordered 5x5 (in/in) 3 x Per Week/15 Days Compression Wrap: Profore Lite LF 3 Multilayer Compression Bandaging System 3 x Per Week/15 Days Discharge Instructions: Apply 3 multi-layer wrap as prescribed. Compression Stockings: Circaid Juxta Lite Compression Wrap (DME) Compression Amount: 30-40 mmHg (right) Discharge Instructions: Apply Circaid Juxta Lite Compression Wrap as directed WOUND #7: - Calcaneus Wound Laterality: Left Cleanser: Normal Saline 3 x Per Week/15 Days Discharge Instructions: Wash your hands with soap and water. Remove old dressing, discard into plastic bag and place into trash. Cleanse the wound with Normal Saline prior to applying a clean dressing using gauze sponges, not tissues or cotton balls. Do not scrub or use excessive force. Pat dry using gauze sponges, not tissue or cotton balls. Cleanser: Soap and Water 3 x Per Week/15 Days Discharge Instructions:  Gently cleanse wound with antibacterial soap, rinse and pat dry prior to dressing wounds Cleanser: Wound Cleanser 3 x Per Week/15 Days Discharge Instructions: Wash your hands with soap and water. Remove old dressing, discard into plastic bag and place into trash. Cleanse the wound with Wound Cleanser prior to applying a clean dressing using gauze sponges, not tissues or cotton balls. Do not scrub or use excessive force. Pat dry using gauze sponges, not tissue or cotton balls. Topical: Triamcinolone Acetonide Cream, 0.1%, 15 (g) tube 3 x Per Week/15 Days Discharge Instructions: Mixed with  half Eucerin or alternative-to bilateral lower legsApply as directed by provider. Topical: Eucerin lotion 3 x Per Week/15 Days Discharge Instructions: Mixed 1/2 with TCA to bilateral lower legs Primary Dressing: Iodosorb 40 (g) 3 x Per Week/15 Days Discharge Instructions: Apply IodoSorb to wound bed only as directed. Secondary Dressing: Zetuvit Plus Silicone Non-bordered 5x5 (in/in) 3 x Per Week/15 Days Compression Wrap: Profore Lite LF 3 Multilayer Compression Bandaging System 3 x Per Week/15 Days Discharge Instructions: Apply 3 multi-layer wrap as prescribed. WOUND #9: - Toe Great Wound Laterality: Right Cleanser: Normal Saline 3 x Per Week/15 Days Discharge Instructions: Wash your hands with soap and water. Remove old dressing, discard into plastic bag and place into trash. Cleanse the wound with Normal Saline prior to applying a clean dressing using gauze sponges, not tissues or cotton balls. Do not scrub or use Faulconer, Camila A. ([id number]) excessive force. Pat dry using gauze sponges, not tissue or cotton balls. Cleanser: Soap and Water 3 x Per Week/15 Days Discharge Instructions: Gently cleanse wound with antibacterial soap, rinse and pat dry prior to dressing wounds Cleanser: Wound Cleanser 3 x Per Week/15 Days Discharge Instructions: Wash your hands with soap and water. Remove old dressing, discard into plastic bag and place into trash. Cleanse the wound with Wound Cleanser prior to applying a clean dressing using gauze sponges, not tissues or cotton balls. Do not scrub or use excessive force. Pat dry using gauze sponges, not tissue or cotton balls. Topical: Triamcinolone Acetonide Cream, 0.1%, 15 (g) tube 3 x Per Week/15 Days Discharge Instructions: Mixed with half Eucerin or alternative-to bilateral lower legsApply as directed by provider. Topical: Eucerin lotion 3 x Per Week/15 Days Discharge Instructions: Mixed 1/2 with TCA to bilateral lower legs Primary Dressing: Iodosorb 40  (g) 3 x Per Week/15 Days Discharge Instructions: Apply IodoSorb to wound bed only as directed. Secondary Dressing: Zetuvit Plus Silicone Non-bordered 5x5 (in/in) 3 x Per Week/15 Days Compression Wrap: Profore Lite LF 3 Multilayer Compression Bandaging System 3 x Per Week/15 Days Discharge Instructions: Apply 3 multi-layer wrap as prescribed. 1. Would recommend currently based on what I am seeing that we actually continue with the Iodosorb for the left heel. I think this is probably the best way to go for now. I do believe Dakin's moistened gauze could be beneficial although he need to be able to change this daily working to see about ordering a compression wrap to try to help in this regard. 2. For the time being we will continue with the compression wraps which is a 3 layer compression wrap bilaterally. Obviously if we get the Velcro compression wrap we will switch over to that on the left. That way she can change this more frequently that is daily with the Dakin's moistened gauze. We will see patient back for reevaluation in 1 week here in the clinic. If anything worsens or changes patient will contact our office for additional recommendations. Electronic  Signature(s) Signed: 06/26/2021 8:27:11 AM By: Worthy Keeler PA-C Previous Signature: 06/23/2021 2:29:13 PM Version By: Worthy Keeler PA-C Entered By: Worthy Keeler on 06/26/2021 08:27:11 [name], [name] ([id number]) -------------------------------------------------------------------------------- SuperBill Details Patient Name: Mccollam, Niel A. Date of Service: 06/23/2021 Medical Record Number: [id number] Patient Account Number: [id number] Date of Birth/Sex: [birth year] (66 y.o. M) Treating RN: Donnamarie Poag Primary Care Provider: Fulton Reek Other Clinician: Referring Provider: Fulton Reek Treating Provider/Extender: Skipper Cliche in Treatment: 4 Diagnosis Coding ICD-10 Codes Code Description [phone] Pressure  ulcer of left heel, stage 3 E11.621 Type 2 diabetes mellitus with foot ulcer L97.522 Non-pressure chronic ulcer of other part of left foot with fat layer exposed L89.893 Pressure ulcer of other site, stage 3 I87.2 Venous insufficiency (chronic) (peripheral) I50.42 Chronic combined systolic (congestive) and diastolic (congestive) heart failure I10 Essential (primary) hypertension L97.818 Non-pressure chronic ulcer of other part of right lower leg with other specified severity Facility Procedures CPT4: Description Modifier Quantity Code [id number] BILATERAL: Application of multi-layer venous compression system; leg (below knee), including 1 ankle and foot. Physician Procedures CPT4 Code: 2952841 Description: 32440 - WC PHYS LEVEL 4 - EST PT Modifier: Quantity: 1 CPT4 Code: Description: ICD-10 Diagnosis Description L89.623 Pressure ulcer of left heel, stage 3 E11.621 Type 2 diabetes mellitus with foot ulcer L97.522 Non-pressure chronic ulcer of other part of left foot with fat layer exp L89.893 Pressure ulcer of other site,  stage 3 Modifier: osed Quantity: Electronic Signature(s) Signed: 06/23/2021 2:32:36 PM By: Worthy Keeler PA-C Entered By: Worthy Keeler on 06/23/2021 14:32:35

## 2021-06-23 NOTE — Progress Notes (Signed)
RESHAD, SAAB ([id number]) Visit Report for 06/23/2021 Arrival Information Details Patient Name: NOLAN, TUAZON A. Date of Service: 06/23/2021 1:00 PM Medical Record Number: [id number] Patient Account Number: [id number] Date of Birth/Sex: Nov 18, 1955 (66 y.o. M) Treating RN: Donnamarie Poag Primary Care Tayon Parekh: Fulton Reek Other Clinician: Referring Sharmila Wrobleski: Fulton Reek Treating Sal Spratley/Extender: Skipper Cliche in Treatment: 4 Visit Information History Since Last Visit Added or deleted any medications: No Patient Arrived: Knee Scooter Had a fall or experienced change in No Arrival Time: 13:06 activities of daily living that may affect Accompanied By: wife risk of falls: Transfer Assistance: None Hospitalized since last visit: No Patient Identification Verified: Yes Has Dressing in Place as Prescribed: Yes Secondary Verification Process Completed: Yes Has Compression in Place as Prescribed: Yes Patient Requires Transmission-Based No Pain Present Now: Yes Precautions: Patient Has Alerts: Yes Patient Alerts: Patient on Blood Thinner DIABETIC Eliquis/Aspirin 81 ABI at [facility] 06/14/21 L 0.88; R 0.87 Electronic Signature(s) Signed: 06/23/2021 1:29:50 PM By: Donnamarie Poag Entered By: Donnamarie Poag on 06/23/2021 13:29:49 Pair, [place]. ([id number]) -------------------------------------------------------------------------------- Compression Therapy Details Patient Name: Tyler, Kiley A. Date of Service: 06/23/2021 1:00 PM Medical Record Number: [id number] Patient Account Number: [id number] Date of Birth/Sex: 1956/02/22 (66 y.o. M) Treating RN: Donnamarie Poag Primary Care Arlyn Buerkle: Fulton Reek Other Clinician: Referring Kayia Billinger: Fulton Reek Treating Graeme Menees/Extender: Skipper Cliche in Treatment: 4 Compression Therapy Performed for Wound Assessment: Wound #11 Right Lower Leg Performed By: Clinician Donnamarie Poag, RN Compression Type: Three Layer Post  Procedure Diagnosis Same as Pre-procedure Electronic Signature(s) Signed: 06/23/2021 4:55:51 PM By: Donnamarie Poag Entered By: Donnamarie Poag on 06/23/2021 13:53:15 Trego, Nanine Means ([id number]) -------------------------------------------------------------------------------- Compression Therapy Details Patient Name: Signore, Aloys A. Date of Service: 06/23/2021 1:00 PM Medical Record Number: [id number] Patient Account Number: [id number] Date of Birth/Sex: March 21, 1956 (66 y.o. M) Treating RN: Donnamarie Poag Primary Care Saliah Crisp: Fulton Reek Other Clinician: Referring Avamae Dehaan: Fulton Reek Treating Tracy Gerken/Extender: Skipper Cliche in Treatment: 4 Compression Therapy Performed for Wound Assessment: Wound #7 Left Calcaneus Performed By: Clinician Donnamarie Poag, RN Compression Type: Three Layer Post Procedure Diagnosis Same as Pre-procedure Electronic Signature(s) Signed: 06/23/2021 4:55:51 PM By: Donnamarie Poag Entered By: Donnamarie Poag on 06/23/2021 13:53:15 Badolato, Nanine Means ([id number]) -------------------------------------------------------------------------------- Encounter Discharge Information Details Patient Name: Grieves, Lakeith A. Date of Service: 06/23/2021 1:00 PM Medical Record Number: [id number] Patient Account Number: [id number] Date of Birth/Sex: March 22, 1956 (66 y.o. M) Treating RN: Donnamarie Poag Primary Care Nichalas Coin: Fulton Reek Other Clinician: Referring Lorcan Shelp: Fulton Reek Treating Olon Russ/Extender: Skipper Cliche in Treatment: 4 Encounter Discharge Information Items Discharge Condition: Stable Ambulatory Status: Knee Scooter Discharge Destination: Home Transportation: Private Auto Accompanied By: wife Schedule Follow-up Appointment: Yes Clinical Summary of Care: Electronic Signature(s) Signed: 06/23/2021 4:55:51 PM By: Donnamarie Poag Entered By: Donnamarie Poag on 06/23/2021 14:03:19 [place], Nanine Means  ([id number]) -------------------------------------------------------------------------------- Lower Extremity Assessment Details Patient Name: Tarpley, Jedrek A. Date of Service: 06/23/2021 1:00 PM Medical Record Number: [id number] Patient Account Number: [id number] Date of Birth/Sex: 06-13-1955 (66 y.o. M) Treating RN: Donnamarie Poag Primary Care Kimetha Trulson: Fulton Reek Other Clinician: Referring Mozelle Remlinger: Fulton Reek Treating Daanya Lanphier/Extender: Skipper Cliche in Treatment: 4 Edema Assessment Assessed: [Left: Yes] [Right: Yes] Edema: [Left: Yes] [Right: Yes] Calf Left: Right: Point of Measurement: 36 cm From Medial Instep 53 cm 54.5 cm Ankle Left: Right: Point of Measurement: 12 cm From Medial Instep 31 cm 29 cm Knee To Floor Left: Right: From Medial Instep 47 cm 47 cm Vascular Assessment Pulses: Dorsalis Pedis Palpable: [Left:Yes] [Right:Yes]  Electronic Signature(s) Signed: 06/23/2021 4:55:51 PM By: Donnamarie Poag Entered By: Donnamarie Poag on 06/23/2021 13:26:25 Bachicha, Rufino Loni Muse ([id number]) -------------------------------------------------------------------------------- Multi Wound Chart Details Patient Name: Gaza, Colleen A. Date of Service: 06/23/2021 1:00 PM Medical Record Number: [id number] Patient Account Number: [id number] Date of Birth/Sex: 10-09-1955 (66 y.o. M) Treating RN: Donnamarie Poag Primary Care Hashim Eichhorst: Fulton Reek Other Clinician: Referring Abri Vacca: Fulton Reek Treating Pelagia Iacobucci/Extender: Skipper Cliche in Treatment: 4 Vital Signs Height(in): 32 Pulse(bpm): 102 Weight(lbs): 371 Blood Pressure(mmHg): 128/79 Body Mass Index(BMI): 48.9 Temperature(F): 98.2 Respiratory Rate(breaths/min): 16 Photos: Wound Location: Right Lower Leg Left Calcaneus Right Toe Great Wounding Event: Gradually Appeared Gradually Appeared Gradually Appeared Primary Etiology: Venous Leg Ulcer Pressure Ulcer Pressure Ulcer Secondary Etiology: [name] [name] Diabetic  Wound/Ulcer of the Lower Extremity Comorbid History: Lymphedema, Asthma, Chronic Lymphedema, Asthma, Chronic Lymphedema, Asthma, Chronic Obstructive Pulmonary Disease Obstructive Pulmonary Disease Obstructive Pulmonary Disease (COPD), Sleep Apnea, Arrhythmia, (COPD), Sleep Apnea, Arrhythmia, (COPD), Sleep Apnea, Arrhythmia, Congestive Heart Failure, Coronary Congestive Heart Failure, Coronary Congestive Heart Failure, Coronary Artery Disease, Hypertension, Artery Disease, Hypertension, Artery Disease, Hypertension, Myocardial Infarction, Peripheral Myocardial Infarction, Peripheral Myocardial Infarction, Peripheral Venous Disease, Colitis, Type II Venous Disease, Colitis, Type II Venous Disease, Colitis, Type II Diabetes, History of pressure Diabetes, History of pressure Diabetes, History of pressure wounds, Gout, Osteoarthritis wounds, Gout, Osteoarthritis wounds, Gout, Osteoarthritis Date Acquired: 06/16/2021 05/05/2021 04/26/2021 Weeks of Treatment: _0 Wound Status: Open Open Open Wound Recurrence: No No No Pending Amputation on No Yes Yes Presentation: Measurements L x W x D (cm) 0.5x0.6x0.1 2.8x4.3x1.5 0.7x0.8x0.2 Area (cm) : 0.236 9.456 0.44 Volume (cm) : 0.024 14.184 0.088 % Reduction in Area: -50.30% -65.80% 29.90% % Reduction in Volume: -50.00% -148.80% -39.70% Starting Position 1 (o'clock): 10 Ending Position 1 (o'clock): 1 Maximum Distance 1 (cm): 0.8 Undermining: No Yes No Classification: Full Thickness Without Exposed Category/Stage III Category/Stage III Support Structures Exudate Amount: Medium Large Medium Exudate Type: Serosanguineous Serosanguineous Serosanguineous Exudate Color: red, brown red, brown red, brown Wound Margin: [name] Thickened [name] Granulation Amount: Large (67-100%) Small (1-33%) Small (1-33%) Granulation Quality: Red, Pale Pink Red, Pink Necrotic Amount: Small (1-33%) Large (67-100%) Large (67-100%) Necrotic Tissue: Adherent Slough Eschar, Adherent  [place] Exposed Structures: Fat Layer (Subcutaneous Tissue): Fat Layer (Subcutaneous Tissue): Fat Layer (Subcutaneous Tissue): Yes Yes Yes Fascia: No Fascia: No Fascia: No Tendon: No Tendon: No Tendon: No Mendel, Claudis A. ([id number]) Muscle: No Muscle: No Muscle: No Joint: No Joint: No Joint: No Bone: No Bone: No Bone: No Treatment Notes Electronic Signature(s) Signed: 06/23/2021 4:55:51 PM By: Donnamarie Poag Entered By: Donnamarie Poag on 06/23/2021 13:52:53 Kapler, Nanine Means ([id number]) -------------------------------------------------------------------------------- Multi-Disciplinary Care Plan Details Patient Name: [name] Date of Service: 06/23/2021 1:00 PM Medical Record Number: [id number] Patient Account Number: [id number] Date of Birth/Sex: [birth year] (66 y.o. M) Treating RN: Donnamarie Poag Primary Care Raylea Adcox: Fulton Reek Other Clinician: Referring Alieu Finnigan: Fulton Reek Treating Bawi Lakins/Extender: Skipper Cliche in Treatment: 4 Active Inactive Wound/Skin Impairment Nursing Diagnoses: Impaired tissue integrity Knowledge deficit related to smoking impact on wound healing Knowledge deficit related to ulceration/compromised skin integrity Goals: Patient will have a decrease in wound volume by X% from date: (specify in notes) Date Initiated: 05/26/2021 Target Resolution Date: 06/23/2021 Goal Status: Active Patient/caregiver will verbalize understanding of skin care regimen Date Initiated: 05/26/2021 Date Inactivated: 06/02/2021 Target Resolution Date: 06/09/2021 Goal Status: Met Interventions: Assess patient/caregiver ability to obtain necessary supplies Assess patient/caregiver ability to perform ulcer/skin care regimen upon admission and as  needed Notes: Electronic Signature(s) Signed: 06/23/2021 4:55:51 PM By: Donnamarie Poag Entered By: Donnamarie Poag on 06/23/2021 13:52:37 Getty, Nanine Means  ([id number]) -------------------------------------------------------------------------------- Pain Assessment Details Patient Name: Kia, Dequavius A. Date of Service: 06/23/2021 1:00 PM Medical Record Number: [id number] Patient Account Number: [id number] Date of Birth/Sex: December 19, 1955 (66 y.o. M) Treating RN: Donnamarie Poag Primary Care Arcadio Cope: Fulton Reek Other Clinician: Referring Soraiya Ahner: Fulton Reek Treating Jaymian Bogart/Extender: Skipper Cliche in Treatment: 4 Active Problems Location of Pain Severity and Description of Pain Patient Has Paino Yes Site Locations Pain Location: Pain in Ulcers Rate the pain. Current Pain Level: 5 Pain Management and Medication Current Pain Management: Electronic Signature(s) Signed: 06/23/2021 4:55:51 PM By: Donnamarie Poag Entered By: Donnamarie Poag on 06/23/2021 13:09:56 Denslow, Nanine Means ([id number]) -------------------------------------------------------------------------------- Patient/Caregiver Education Details Patient Name: [name] Date of Service: 06/23/2021 1:00 PM Medical Record Number: [id number] Patient Account Number: [id number] Date of Birth/Gender: [birth year] (66 y.o. M) Treating RN: Donnamarie Poag Primary Care Physician: Fulton Reek Other Clinician: Referring Physician: Fulton Reek Treating Physician/Extender: Skipper Cliche in Treatment: 4 Education Assessment Education Provided To: Patient Education Topics Provided Wound/Skin Impairment: Electronic Signature(s) Signed: 06/23/2021 4:55:51 PM By: Donnamarie Poag Entered By: Donnamarie Poag on 06/23/2021 14:02:00 Oborn, Nanine Means ([id number]) -------------------------------------------------------------------------------- Wound Assessment Details Patient Name: Bangert, Louise A. Date of Service: 06/23/2021 1:00 PM Medical Record Number: [id number] Patient Account Number: [id number] Date of Birth/Sex: Sep 02, 1955 (65 y.o. M) Treating RN: Donnamarie Poag Primary  Care Chae Shuster: Fulton Reek Other Clinician: Referring Mahiya Kercheval: Fulton Reek Treating Bertice Risse/Extender: Skipper Cliche in Treatment: 4 Wound Status Wound Number: 11 Primary Venous Leg Ulcer Etiology: Wound Location: Right Lower Leg Wound Open Wounding Event: Gradually Appeared Status: Date Acquired: 06/16/2021 Comorbid Lymphedema, Asthma, Chronic Obstructive Pulmonary Weeks Of Treatment: 1 History: Disease (COPD), Sleep Apnea, Arrhythmia, Congestive Clustered Wound: No Heart Failure, Coronary Artery Disease, Hypertension, Myocardial Infarction, Peripheral Venous Disease, Colitis, Type II Diabetes, History of pressure wounds, Gout, Osteoarthritis Photos Wound Measurements Length: (cm) 0.5 Width: (cm) 0.6 Depth: (cm) 0.1 Area: (cm) 0.236 Volume: (cm) 0.024 % Reduction in Area: -50.3% % Reduction in Volume: -50% Tunneling: No Undermining: No Wound Description Classification: Full Thickness Without Exposed Support Structures Exudate Amount: Medium Exudate Type: Serosanguineous Exudate Color: red, brown Foul Odor After Cleansing: No Slough/Fibrino No Wound Bed Granulation Amount: Large (67-100%) Exposed Structure Granulation Quality: Red, Pale Fascia Exposed: No Necrotic Amount: Small (1-33%) Fat Layer (Subcutaneous Tissue) Exposed: Yes Necrotic Quality: Adherent Slough Tendon Exposed: No Muscle Exposed: No Joint Exposed: No Bone Exposed: No Treatment Notes Wound #11 (Lower Leg) Wound Laterality: Right Cleanser Normal Saline Valdes, Nello A. ([id number]) Discharge Instruction: Wash your hands with soap and water. Remove old dressing, discard into plastic bag and place into trash. Cleanse the wound with Normal Saline prior to applying a clean dressing using gauze sponges, not tissues or cotton balls. Do not scrub or use excessive force. Pat dry using gauze sponges, not tissue or cotton balls. Soap and Water Discharge Instruction: Gently cleanse wound  with antibacterial soap, rinse and pat dry prior to dressing wounds Wound Cleanser Discharge Instruction: Wash your hands with soap and water. Remove old dressing, discard into plastic bag and place into trash. Cleanse the wound with Wound Cleanser prior to applying a clean dressing using gauze sponges, not tissues or cotton balls. Do not scrub or use excessive force. Pat dry using gauze sponges, not tissue or cotton balls. Peri-Wound Care Topical Triamcinolone Acetonide Cream, 0.1%, 15 (g) tube Discharge Instruction: Mixed with  half Eucerin or alternative-to bilateral lower legsApply as directed by Hamsa Laurich. Eucerin lotion Discharge Instruction: Mixed 1/2 with TCA to bilateral lower legs Primary Dressing Iodosorb 40 (g) Discharge Instruction: Apply IodoSorb to wound bed only as directed. Secondary Dressing Zetuvit Plus Silicone Non-bordered 5x5 (in/in) Secured With Compression Wrap Profore Lite LF 3 Multilayer Compression Bandaging System Discharge Instruction: Apply 3 multi-layer wrap as prescribed. Compression Stockings Circaid Juxta Lite Compression Wrap Quantity: 1 Right Leg Compression Amount: 30-40 mmHg Discharge Instruction: Apply Circaid Juxta Lite Compression Wrap as directed Add-Ons Electronic Signature(s) Signed: 06/23/2021 4:55:51 PM By: Donnamarie Poag Entered By: Donnamarie Poag on 06/23/2021 13:24:59 Douthat, Nanine Means ([id number]) -------------------------------------------------------------------------------- Wound Assessment Details Patient Name: Maiolo, Ziv A. Date of Service: 06/23/2021 1:00 PM Medical Record Number: [id number] Patient Account Number: [id number] Date of Birth/Sex: 1955/12/27 (66 y.o. M) Treating RN: Donnamarie Poag Primary Care Mariam Helbert: Fulton Reek Other Clinician: Referring Eddison Searls: Fulton Reek Treating Sherylann Vangorden/Extender: Skipper Cliche in Treatment: 4 Wound Status Wound Number: 7 Primary Pressure Ulcer Etiology: Wound Location:  Left Calcaneus Wound Open Wounding Event: Gradually Appeared Status: Date Acquired: 05/05/2021 Comorbid Lymphedema, Asthma, Chronic Obstructive Pulmonary Weeks Of Treatment: 4 History: Disease (COPD), Sleep Apnea, Arrhythmia, Congestive Clustered Wound: No Heart Failure, Coronary Artery Disease, Hypertension, Pending Amputation On Presentation Myocardial Infarction, Peripheral Venous Disease, Colitis, Type II Diabetes, History of pressure wounds, Gout, Osteoarthritis Photos Wound Measurements Length: (cm) 2.8 % Redu Width: (cm) 4.3 % Redu Depth: (cm) 1.5 Tunnel Area: (cm) 9.456 Under Volume: (cm) 14.184 Sta Endi Maxi ction in Area: -65.8% ction in Volume: -148.8% ing: No mining: Yes rting Position (o'clock): 10 ng Position (o'clock): 1 mum Distance: (cm) 0.8 Wound Description Classification: Category/Stage III Foul Wound Margin: Thickened Sloug Exudate Amount: Large Exudate Type: Serosanguineous Exudate Color: red, brown Odor After Cleansing: No h/Fibrino Yes Wound Bed Granulation Amount: Small (1-33%) Exposed Structure Granulation Quality: Pink Fascia Exposed: No Necrotic Amount: Large (67-100%) Fat Layer (Subcutaneous Tissue) Exposed: Yes Necrotic Quality: Eschar, Adherent Slough Tendon Exposed: No Muscle Exposed: No Joint Exposed: No Bone Exposed: No Treatment Notes Wound #7 (Calcaneus) Wound Laterality: Left Doi, Kori A. ([id number]) Cleanser Normal Saline Discharge Instruction: Wash your hands with soap and water. Remove old dressing, discard into plastic bag and place into trash. Cleanse the wound with Normal Saline prior to applying a clean dressing using gauze sponges, not tissues or cotton balls. Do not scrub or use excessive force. Pat dry using gauze sponges, not tissue or cotton balls. Soap and Water Discharge Instruction: Gently cleanse wound with antibacterial soap, rinse and pat dry prior to dressing wounds Wound Cleanser Discharge  Instruction: Wash your hands with soap and water. Remove old dressing, discard into plastic bag and place into trash. Cleanse the wound with Wound Cleanser prior to applying a clean dressing using gauze sponges, not tissues or cotton balls. Do not scrub or use excessive force. Pat dry using gauze sponges, not tissue or cotton balls. Peri-Wound Care Topical Triamcinolone Acetonide Cream, 0.1%, 15 (g) tube Discharge Instruction: Mixed with half Eucerin or alternative-to bilateral lower legsApply as directed by Vander Kueker. Eucerin lotion Discharge Instruction: Mixed 1/2 with TCA to bilateral lower legs Primary Dressing Iodosorb 40 (g) Discharge Instruction: Apply IodoSorb to wound bed only as directed. Secondary Dressing Zetuvit Plus Silicone Non-bordered 5x5 (in/in) Secured With Compression Wrap Profore Lite LF 3 Multilayer Compression Bandaging System Discharge Instruction: Apply 3 multi-layer wrap as prescribed. Compression Stockings Add-Ons Electronic Signature(s) Signed: 06/23/2021 4:55:51 PM By: Donnamarie Poag Entered By: Donnamarie Poag on  06/23/2021 13:23:14 Nantz, JAYVEON CONVEY ([id number]) -------------------------------------------------------------------------------- Wound Assessment Details Patient Name: Jacobson, Athol A. Date of Service: 06/23/2021 1:00 PM Medical Record Number: [id number] Patient Account Number: [id number] Date of Birth/Sex: July 21, 1955 (66 y.o. M) Treating RN: Donnamarie Poag Primary Care Treyton Slimp: Fulton Reek Other Clinician: Referring Domingue Coltrain: Fulton Reek Treating Jashan Cotten/Extender: Skipper Cliche in Treatment: 4 Wound Status Wound Number: 9 Primary Pressure Ulcer Etiology: Wound Location: Right Toe Great Secondary Diabetic Wound/Ulcer of the Lower Extremity Wounding Event: Gradually Appeared Etiology: Date Acquired: 04/26/2021 Wound Open Weeks Of Treatment: 4 Status: Clustered Wound: No Comorbid Lymphedema, Asthma, Chronic Obstructive  Pulmonary Pending Amputation On Presentation History: Disease (COPD), Sleep Apnea, Arrhythmia, Congestive Heart Failure, Coronary Artery Disease, Hypertension, Myocardial Infarction, Peripheral Venous Disease, Colitis, Type II Diabetes, History of pressure wounds, Gout, Osteoarthritis Photos Wound Measurements Length: (cm) 0.7 % Reduc Width: (cm) 0.8 % Reduc Depth: (cm) 0.2 Tunneli Area: (cm) 0.44 Underm Volume: (cm) 0.088 tion in Area: 29.9% tion in Volume: -39.7% ng: No ining: No Wound Description Classification: Category/Stage III Foul O Exudate Amount: Medium Slough Exudate Type: Serosanguineous Exudate Color: red, brown dor After Cleansing: No /Fibrino Yes Wound Bed Granulation Amount: Small (1-33%) Exposed Structure Granulation Quality: Red, Pink Fascia Exposed: No Necrotic Amount: Large (67-100%) Fat Layer (Subcutaneous Tissue) Exposed: Yes Necrotic Quality: Adherent Slough Tendon Exposed: No Muscle Exposed: No Joint Exposed: No Bone Exposed: No Treatment Notes Wound #9 (Toe Great) Wound Laterality: Right Cleanser Lust, Lecil A. ([id number]) Normal Saline Discharge Instruction: Wash your hands with soap and water. Remove old dressing, discard into plastic bag and place into trash. Cleanse the wound with Normal Saline prior to applying a clean dressing using gauze sponges, not tissues or cotton balls. Do not scrub or use excessive force. Pat dry using gauze sponges, not tissue or cotton balls. Soap and Water Discharge Instruction: Gently cleanse wound with antibacterial soap, rinse and pat dry prior to dressing wounds Wound Cleanser Discharge Instruction: Wash your hands with soap and water. Remove old dressing, discard into plastic bag and place into trash. Cleanse the wound with Wound Cleanser prior to applying a clean dressing using gauze sponges, not tissues or cotton balls. Do not scrub or use excessive force. Pat dry using gauze sponges, not tissue  or cotton balls. Peri-Wound Care Topical Triamcinolone Acetonide Cream, 0.1%, 15 (g) tube Discharge Instruction: Mixed with half Eucerin or alternative-to bilateral lower legsApply as directed by Korryn Pancoast. Eucerin lotion Discharge Instruction: Mixed 1/2 with TCA to bilateral lower legs Primary Dressing Iodosorb 40 (g) Discharge Instruction: Apply IodoSorb to wound bed only as directed. Secondary Dressing Zetuvit Plus Silicone Non-bordered 5x5 (in/in) Secured With Compression Wrap Profore Lite LF 3 Multilayer Compression Bandaging System Discharge Instruction: Apply 3 multi-layer wrap as prescribed. Compression Stockings Add-Ons Electronic Signature(s) Signed: 06/23/2021 4:55:51 PM By: Donnamarie Poag Entered By: Donnamarie Poag on 06/23/2021 13:24:14 Gorr, Nanine Means ([id number]) -------------------------------------------------------------------------------- Vitals Details Patient Name: [name] Date of Service: 06/23/2021 1:00 PM Medical Record Number: [id number] Patient Account Number: [id number] Date of Birth/Sex: Jul 16, 1955 (66 y.o. M) Treating RN: Donnamarie Poag Primary Care Shanavia Makela: Fulton Reek Other Clinician: Referring Marquita Lias: Fulton Reek Treating Yannet Rincon/Extender: Skipper Cliche in Treatment: 4 Vital Signs Time Taken: 13:09 Temperature (F): 98.2 Height (in): 73 Pulse (bpm): 102 Weight (lbs): 371 Respiratory Rate (breaths/min): 16 Body Mass Index (BMI): 48.9 Blood Pressure (mmHg): 128/79 Reference Range: 80 - 120 mg / dl Electronic Signature(s) Signed: 06/23/2021 4:55:51 PM By: Donnamarie Poag Entered ByDonnamarie Poag on 06/23/2021 13:09:37

## 2021-06-26 ENCOUNTER — Other Ambulatory Visit: Payer: Self-pay | Admitting: Family

## 2021-06-26 DIAGNOSIS — I5032 Chronic diastolic (congestive) heart failure: Secondary | ICD-10-CM | POA: Diagnosis not present

## 2021-06-26 DIAGNOSIS — E1151 Type 2 diabetes mellitus with diabetic peripheral angiopathy without gangrene: Secondary | ICD-10-CM | POA: Diagnosis not present

## 2021-06-26 DIAGNOSIS — M109 Gout, unspecified: Secondary | ICD-10-CM | POA: Diagnosis not present

## 2021-06-26 DIAGNOSIS — I4891 Unspecified atrial fibrillation: Secondary | ICD-10-CM | POA: Diagnosis not present

## 2021-06-26 DIAGNOSIS — R091 Pleurisy: Secondary | ICD-10-CM | POA: Diagnosis not present

## 2021-06-26 DIAGNOSIS — L89893 Pressure ulcer of other site, stage 3: Secondary | ICD-10-CM | POA: Diagnosis not present

## 2021-06-26 DIAGNOSIS — I2510 Atherosclerotic heart disease of native coronary artery without angina pectoris: Secondary | ICD-10-CM | POA: Diagnosis not present

## 2021-06-26 DIAGNOSIS — J449 Chronic obstructive pulmonary disease, unspecified: Secondary | ICD-10-CM | POA: Diagnosis not present

## 2021-06-26 DIAGNOSIS — L97522 Non-pressure chronic ulcer of other part of left foot with fat layer exposed: Secondary | ICD-10-CM | POA: Diagnosis not present

## 2021-06-26 DIAGNOSIS — I5023 Acute on chronic systolic (congestive) heart failure: Secondary | ICD-10-CM | POA: Diagnosis not present

## 2021-06-26 DIAGNOSIS — I110 Hypertensive heart disease with heart failure: Secondary | ICD-10-CM | POA: Diagnosis not present

## 2021-06-26 DIAGNOSIS — I890 Lymphedema, not elsewhere classified: Secondary | ICD-10-CM | POA: Diagnosis not present

## 2021-06-26 DIAGNOSIS — I872 Venous insufficiency (chronic) (peripheral): Secondary | ICD-10-CM | POA: Diagnosis not present

## 2021-06-26 DIAGNOSIS — E785 Hyperlipidemia, unspecified: Secondary | ICD-10-CM | POA: Diagnosis not present

## 2021-06-26 DIAGNOSIS — G4733 Obstructive sleep apnea (adult) (pediatric): Secondary | ICD-10-CM | POA: Diagnosis not present

## 2021-06-26 DIAGNOSIS — L89623 Pressure ulcer of left heel, stage 3: Secondary | ICD-10-CM | POA: Diagnosis not present

## 2021-06-27 ENCOUNTER — Other Ambulatory Visit
Admission: RE | Admit: 2021-06-27 | Discharge: 2021-06-27 | Disposition: A | Discharge status: Home / Self Care | Payer: Medicare Other | Source: Ambulatory Visit | Attending: Family | Admitting: Family

## 2021-06-27 ENCOUNTER — Other Ambulatory Visit: Payer: Self-pay

## 2021-06-27 DIAGNOSIS — Z01812 Encounter for preprocedural laboratory examination: Secondary | ICD-10-CM | POA: Insufficient documentation

## 2021-06-27 LAB — BASIC METABOLIC PANEL WITH GFR
Anion gap: 10 (ref 5–15)
BUN: 22 mg/dL (ref 8–23)
CO2: 35 mmol/L — ABNORMAL HIGH (ref 22–32)
Calcium: 8.9 mg/dL (ref 8.9–10.3)
Chloride: 93 mmol/L — ABNORMAL LOW (ref 98–111)
Creatinine, Ser: 1.14 mg/dL (ref 0.61–1.24)
GFR, Estimated: >60 mL/min
Glucose, Bld: 318 mg/dL — ABNORMAL HIGH (ref 70–99)
Potassium: 4.8 mmol/L (ref 3.5–5.1)
Sodium: 138 mmol/L (ref 135–145)

## 2021-06-28 ENCOUNTER — Telehealth: Payer: Self-pay

## 2021-06-28 ENCOUNTER — Other Ambulatory Visit: Payer: Self-pay | Admitting: Family

## 2021-06-28 MED ORDER — METOLAZONE 2.5 MG PO TABS: 2.5000 mg | ORAL_TABLET | Freq: Every day | ORAL | 0 refills | Status: DC

## 2021-06-28 NOTE — Telephone Encounter (Addendum)
Spoke with patient to review normal lab results. Patient verbalized understanding and had no further questions or concerns at this time. ?Georg Ruddle, RN ?----- Message from Alisa Graff, [place] sent at 06/28/2021  8:14 AM EST ----- ?Labs look good, kidney function and potassium are normal.  ?

## 2021-06-28 NOTE — Telephone Encounter (Addendum)
Patient called to state that he's still having some shortness of breath with activity and noticing abdominal swelling after finishing 3 day round of metolazone yesterday. He states that his weight is continuing to come down. He states he weighed 380.1 lb on 06/06/21, 370. 4 lb on 06/26/21, and 359.1 lb today, 06/28/21. He stated he was interested in doing another round of metolazone.  ?Informed patient per Darylene Price, FNP, that she is sending a prescription with instructions for another round of metolazone to his pharmacy that he can take this week. Informed patient that he will need to come in for lab work after this dose of metolazone. Patient stated that he has an appt with his PCP, Dr. Doy Hutching, on Monday 07/03/21. Instructed patient that we will contact Dr. Doy Hutching' office and ask if they can check his blood work on Monday. If not, we will call back to reschedule lab work to be done here in [facility].  ?Patient verbalized understanding and stated he has no further questions at this time. ?Georg Ruddle, RN ? ?Update: Spoke with Dr. Doy Hutching' nurse and they will get his B MET lab work at his appointment on 3/6 and fax it to [facility], [place]. ? ?

## 2021-06-30 ENCOUNTER — Other Ambulatory Visit: Payer: Self-pay

## 2021-06-30 ENCOUNTER — Encounter: Payer: Medicare Other | Attending: Physician Assistant | Admitting: Physician Assistant

## 2021-06-30 DIAGNOSIS — I70245 Atherosclerosis of native arteries of left leg with ulceration of other part of foot: Secondary | ICD-10-CM | POA: Diagnosis not present

## 2021-06-30 DIAGNOSIS — L89893 Pressure ulcer of other site, stage 3: Secondary | ICD-10-CM | POA: Diagnosis not present

## 2021-06-30 DIAGNOSIS — I872 Venous insufficiency (chronic) (peripheral): Secondary | ICD-10-CM | POA: Diagnosis not present

## 2021-06-30 DIAGNOSIS — L89623 Pressure ulcer of left heel, stage 3: Secondary | ICD-10-CM | POA: Insufficient documentation

## 2021-06-30 DIAGNOSIS — L97812 Non-pressure chronic ulcer of other part of right lower leg with fat layer exposed: Secondary | ICD-10-CM | POA: Diagnosis not present

## 2021-06-30 DIAGNOSIS — I70235 Atherosclerosis of native arteries of right leg with ulceration of other part of foot: Secondary | ICD-10-CM | POA: Insufficient documentation

## 2021-06-30 DIAGNOSIS — I2510 Atherosclerotic heart disease of native coronary artery without angina pectoris: Secondary | ICD-10-CM | POA: Insufficient documentation

## 2021-06-30 DIAGNOSIS — I5042 Chronic combined systolic (congestive) and diastolic (congestive) heart failure: Secondary | ICD-10-CM | POA: Insufficient documentation

## 2021-06-30 DIAGNOSIS — E11622 Type 2 diabetes mellitus with other skin ulcer: Secondary | ICD-10-CM | POA: Insufficient documentation

## 2021-06-30 DIAGNOSIS — E11621 Type 2 diabetes mellitus with foot ulcer: Secondary | ICD-10-CM | POA: Diagnosis not present

## 2021-06-30 DIAGNOSIS — Z89421 Acquired absence of other right toe(s): Secondary | ICD-10-CM | POA: Diagnosis not present

## 2021-06-30 DIAGNOSIS — Z794 Long term (current) use of insulin: Secondary | ICD-10-CM | POA: Diagnosis not present

## 2021-06-30 DIAGNOSIS — L97522 Non-pressure chronic ulcer of other part of left foot with fat layer exposed: Secondary | ICD-10-CM | POA: Diagnosis not present

## 2021-06-30 DIAGNOSIS — L97818 Non-pressure chronic ulcer of other part of right lower leg with other specified severity: Secondary | ICD-10-CM | POA: Insufficient documentation

## 2021-06-30 DIAGNOSIS — I110 Hypertensive heart disease with heart failure: Secondary | ICD-10-CM | POA: Insufficient documentation

## 2021-06-30 DIAGNOSIS — L97512 Non-pressure chronic ulcer of other part of right foot with fat layer exposed: Secondary | ICD-10-CM | POA: Diagnosis not present

## 2021-06-30 NOTE — Progress Notes (Addendum)
SEANN, GENTHER ([id number]) Visit Report for 06/30/2021 Chief Complaint Document Details Patient Name: [name], [name] Date of Service: 06/30/2021 1:45 PM Medical Record Number: [id number] Patient Account Number: [id number] Date of Birth/Sex: 30-Nov-1955 (66 y.o. M) Treating RN: Donnamarie Poag Primary Care Provider: Fulton Reek Other Clinician: Referring Provider: Fulton Reek Treating Provider/Extender: Skipper Cliche in Treatment: 5 Information Obtained from: Patient Chief Complaint Bilateral LE Ulcers Electronic Signature(s) Signed: 06/30/2021 2:07:09 PM By: Worthy Keeler PA-C Entered By: Worthy Keeler on 06/30/2021 14:07:09 Quiggle, Nanine Means ([id number]) -------------------------------------------------------------------------------- HPI Details Patient Name: Alec Schooner A. Date of Service: 06/30/2021 1:45 PM Medical Record Number: [id number] Patient Account Number: [id number] Date of Birth/Sex: 19-Jul-1955 (66 y.o. M) Treating RN: Donnamarie Poag Primary Care Provider: Fulton Reek Other Clinician: Referring Provider: Fulton Reek Treating Provider/Extender: Skipper Cliche in Treatment: 5 History of Present Illness HPI Description: Admission 8/31 [name] is a 66 year old male with a past medical history of uncontrolled insulin-dependent type 2 diabetes, CAD, osteomyelitis to the left third toe status post partial third ray amputation, and peripheral arterial disease that presents for a left plantar foot wound and bilateral lower extremity wounds. The left plantar foot wound has been present for at least 7 months. He has been following podiatry for this issue. He uses silver collagen to this area. He reports that in the past 1 to 2 months he has developed multiple scattered open wounds to his lower extremities bilaterally that have worsened over time. He states that they often develop as either blisters or he hits his leg against an object.He is  currently using Betadine to these areas. He denies signs of infection. He has compression stockings however he cannot put these on very easily. Also he is on his feet most of the day. He states he does not have anyone to help him do the activities he needs to get done. Readmission: 05/26/2021 upon evaluation today patient presents for reevaluation here in the clinic concerning issues he is been having with his bilateral lower extremities, his right great toe, and left heel primarily. With that being said he has been in the hospital recently and his hemoglobin A1c on January 10 was 9.2. With that being said he does have a lot of really thickened dry skin attached to his legs when he try to remove that today. He is in agreement with the plan. Nonetheless I do believe that based on what we are seeing he is been having a lot of issues with his congestive heart failure I do have him on fluid pills he does go to the bathroom quite a bit. Subsequently has been in [employer] but I do not think that is doing quite enough to help control his edema. He also needs something better than Santyl to put on the heel right now when he was in the hospital they actually cut a space open in the compression around his heel so they can open it up and actually apply the Santyl daily. I think he needs something better than that. Again the patient does have a history of diabetes mellitus type 2, chronic venous insufficiency, hypertension, and congestive heart failure. He also has signs of stage III lymphedema as well. 06/02/2021 upon evaluation today patient appears to be doing decently well in regard to his wounds. Fortunately I do not see any signs of active infection at this time which is great news. Fortunately I think that he is making excellent progress. Nonetheless I do think  that we seem to be making some progress here. 06/09/2021 upon evaluation today patient appears to be doing well with regard to his legs all  things considered unfortunately the left heel is not doing nearly as well as what I like to see. There is a lot of necrotic tissue this is very boggy. I think that it is going to require potentially much more debridement but again we really need to get the results of the arterial testing done and this is actually scheduled for the 15th. 2/17; arrives in clinic today with the left plantar heel wound completely necrotic and malodorous. Comparing the picture with last week this looks quite a bit worse. He has been using Iodoflex under compression. He has an area on the right first toe which seems clean and better and a new area on the right anterior mid Tibia. I was unable to find the results of his arterial studies that he had done on 1/15. Presumably they have not been read. I can see that they have been done 06/23/2021 upon evaluation today patient appears to be doing well currently in regard to his legs. I am actually very pleased in that regard. Fortunately I do not see any signs of active infection at this time. I have reviewed his arterial study and it appeared to be pretty good with TBI's on the left of 0.88 on the right 0.87. Overall I think that we are definitely headed in the right direction which is great news. Fortunately I do not see any signs of active infection locally or systemically at this time. 06/30/2021 upon evaluation today patient appears to be doing well with regard to the right lower extremity ulcers with very pleased in this regard. I do feel like the Iodoflex has done well here. In regard to the left heel location this is something that I am actually a little bit more concerned about I really think that switching over to the Dakin's moistened gauze dressing may be the best way to go at this point based on what we are seeing. We have been using Iodoflex is just not clearing up quite as well as I would like to see and I think this may be a better method at which to get things  under better control. The good news is he does have his Velcro compression wrap so we can try that at this point as well. Electronic Signature(s) Signed: 06/30/2021 4:53:26 PM By: Worthy Keeler PA-C Entered By: Worthy Keeler on 06/30/2021 16:53:25 Brister, Nanine Means ([id number]) -------------------------------------------------------------------------------- Physical Exam Details Patient Name: [name], [name] Date of Service: 06/30/2021 1:45 PM Medical Record Number: [id number] Patient Account Number: [id number] Date of Birth/Sex: Nov 10, 1955 (66 y.o. M) Treating RN: Donnamarie Poag Primary Care Provider: Fulton Reek Other Clinician: Referring Provider: Fulton Reek Treating Provider/Extender: Skipper Cliche in Treatment: 5 Constitutional Obese and well-hydrated in no acute distress. Respiratory normal breathing without difficulty. Psychiatric this patient is able to make decisions and demonstrates good insight into disease process. Alert and Oriented x 3. pleasant and cooperative. Notes Upon inspection patient's wound bed actually showed signs of fairly good granulation and epithelization in regard to the right wounds and no problem to keep things the same here. On the left leg we are going to go ahead and switch over to the Dakin's moistened gauze dressing we did actually show them how to do that today I Am going to send in the prescription as well. Electronic Signature(s) Signed: 06/30/2021 4:53:50 PM By: Joaquim Lai  III, Kalayla Shadden PA-C Entered By: Worthy Keeler on 06/30/2021 16:53:50 Shupert, Nanine Means ([id number]) -------------------------------------------------------------------------------- Physician Orders Details Patient Name: PHELAN, SCHADT A. Date of Service: 06/30/2021 1:45 PM Medical Record Number: [id number] Patient Account Number: [id number] Date of Birth/Sex: 03-Jul-1955 (66 y.o. M) Treating RN: Donnamarie Poag Primary Care Provider: Fulton Reek Other  Clinician: Referring Provider: Fulton Reek Treating Provider/Extender: Skipper Cliche in Treatment: 5 Verbal / Phone Orders: No Diagnosis Coding ICD-10 Coding Code Description [phone] Pressure ulcer of left heel, stage 3 E11.621 Type 2 diabetes mellitus with foot ulcer L97.522 Non-pressure chronic ulcer of other part of left foot with fat layer exposed L89.893 Pressure ulcer of other site, stage 3 I87.2 Venous insufficiency (chronic) (peripheral) I50.42 Chronic combined systolic (congestive) and diastolic (congestive) heart failure I10 Essential (primary) hypertension L97.818 Non-pressure chronic ulcer of other part of right lower leg with other specified severity Follow-up Appointments o Return Appointment in 2 weeks. - at request o Nurse Visit as needed - [facility] will change week of 3/6 [place] for wound care. May utilize formulary equivalent dressing for wound treatment orders unless otherwise specified. Home Health Nurse may visit PRN to address patientos wound care needs. o Scheduled days for dressing changes to be completed; exception, patient has scheduled wound care visit that day. - **No wound center appt week of 3/6 HH to wrap twice weekly and at wound center one time unless he is unable to have appt at wound center that week then 3 x o **Please direct any NON-WOUND related issues/requests for orders to patient's Primary Care Physician. **If current dressing causes regression in wound condition, may D/C ordered dressing product/s and apply Normal Saline Moist Dressing daily until next [place] or Other MD appointment. **Notify Wound Healing Center of regression in wound condition at [phone]. Bathing/ Shower/ Hygiene o May shower with wound dressing protected with water repellent cover or cast protector. o No tub bath. Anesthetic (Use 'Patient Medications' Section for Anesthetic  Order Entry) o Lidocaine applied to wound bed Edema Control - Lymphedema / Segmental Compressive Device / Other o Optional: One layer of unna paste to top of compression wrap (to act as an anchor). o Tubigrip single layer applied. - left leg in place of the sock that comes with Juxtelite-size F o Elevate legs to the level of the heart and pump ankles as often as possible o Elevate leg(s) parallel to the floor when sitting. o DO YOUR BEST to sleep in the bed at night. DO NOT sleep in your recliner. Long hours of sitting in a recliner leads to swelling of the legs and/or potential wounds on your backside. Non-Wound Condition o Additional non-wound orders/instructions: - Triamcinolone mixed with lotion to bilateral lower legs gauze between toes to keep dry; bandaid to cover newly healed skin to toe Off-Loading o Open toe surgical shoe with peg assist. - L heel wedge shoe Recommend knee scooter o Turn and reposition every 2 hours - keep pressure off of left heel Additional Orders / Instructions o Follow Nutritious Diet and Increase Protein Intake - monitor blood sugar o Other: - Recommend urea cream for legs Keinath, Ward A. ([id number]) Medications-Please add to medication list. o P.O. Antibiotics - Pick up extended amount of antibiotic and take as directed for the heel wound Wound Treatment Wound #11 - Lower Leg Wound Laterality: Right Cleanser: Normal Saline 3 x Per Week/15 Days Discharge Instructions: Wash your hands with soap and  water. Remove old dressing, discard into plastic bag and place into trash. Cleanse the wound with Normal Saline prior to applying a clean dressing using gauze sponges, not tissues or cotton balls. Do not scrub or use excessive force. Pat dry using gauze sponges, not tissue or cotton balls. Cleanser: Soap and Water 3 x Per Week/15 Days Discharge Instructions: Gently cleanse wound with antibacterial soap, rinse and pat dry prior to  dressing wounds Cleanser: Wound Cleanser 3 x Per Week/15 Days Discharge Instructions: Wash your hands with soap and water. Remove old dressing, discard into plastic bag and place into trash. Cleanse the wound with Wound Cleanser prior to applying a clean dressing using gauze sponges, not tissues or cotton balls. Do not scrub or use excessive force. Pat dry using gauze sponges, not tissue or cotton balls. Topical: Triamcinolone Acetonide Cream, 0.1%, 15 (g) tube 3 x Per Week/15 Days Discharge Instructions: Mixed with half Eucerin or alternative-to bilateral lower legsApply as directed by provider. Topical: Eucerin lotion 3 x Per Week/15 Days Discharge Instructions: Mixed 1/2 with TCA to bilateral lower legs Primary Dressing: Iodosorb 40 (g) 3 x Per Week/15 Days Discharge Instructions: Apply IodoSorb to wound bed only as directed. Secondary Dressing: (NON-BORDER) Zetuvit Plus Silicone NON-BORDER 5x5 (in/in) 3 x Per Week/15 Days Compression Wrap: 3-LAYER WRAP - Profore Lite LF 3 Multilayer Compression Bandaging System 3 x Per Week/15 Days Discharge Instructions: Apply 3 multi-layer wrap as prescribed. Compression Stockings: Circaid Juxta Lite Compression Wrap Right Leg Compression Amount: 30-40 mmHG Discharge Instructions: Apply Circaid Juxta Lite Compression Wrap as directed Wound #7 - Calcaneus Wound Laterality: Left Cleanser: Dakin 16 (oz) 0.25 1 x Per Day/15 Days Discharge Instructions: Use as directed. Cleanser: Normal Saline 1 x Per LFY/10 Days Discharge Instructions: Wash your hands with soap and water. Remove old dressing, discard into plastic bag and place into trash. Cleanse the wound with Normal Saline prior to applying a clean dressing using gauze sponges, not tissues or cotton balls. Do not scrub or use excessive force. Pat dry using gauze sponges, not tissue or cotton balls. Cleanser: Soap and Water 1 x Per Day/15 Days Discharge Instructions: Gently cleanse wound with  antibacterial soap, rinse and pat dry prior to dressing wounds Cleanser: Wound Cleanser 1 x Per Day/15 Days Discharge Instructions: Wash your hands with soap and water. Remove old dressing, discard into plastic bag and place into trash. Cleanse the wound with Wound Cleanser prior to applying a clean dressing using gauze sponges, not tissues or cotton balls. Do not scrub or use excessive force. Pat dry using gauze sponges, not tissue or cotton balls. Topical: Triamcinolone Acetonide Cream, 0.1%, 15 (g) tube 1 x Per Day/15 Days Discharge Instructions: Mixed with half Eucerin or alternative-to bilateral lower legsApply as directed by provider. Topical: Eucerin lotion 1 x Per Day/15 Days Discharge Instructions: Mixed 1/2 with TCA to bilateral lower legs Primary Dressing: Gauze 1 x Per Day/15 Days Discharge Instructions: Dakins lightly soaked gauze Secondary Dressing: (NON-BORDER) Zetuvit Plus Silicone NON-BORDER 5x5 (in/in) 1 x Per Day/15 Days Secured With: Medipore Tape - 58M Medipore H Soft Cloth Surgical Tape, 2x2 (in/yd) 1 x Per Day/15 Days Secured With: Kerlix Roll Sterile or Non-Sterile 6-ply 4.5x4 (yd/yd) 1 x Per Day/15 Days Discharge Instructions: Apply Kerlix as directed Kawa, Ivon A. ([id number]) Compression Wrap: Left leg JUXTELITE WITH TUBI GRIP UNDER 1 x Per Day/15 Days Wound #9 - Toe Great Wound Laterality: Right Cleanser: Normal Saline 3 x Per Week/15 Days Discharge Instructions: Wash your hands  with soap and water. Remove old dressing, discard into plastic bag and place into trash. Cleanse the wound with Normal Saline prior to applying a clean dressing using gauze sponges, not tissues or cotton balls. Do not scrub or use excessive force. Pat dry using gauze sponges, not tissue or cotton balls. Cleanser: Soap and Water 3 x Per Week/15 Days Discharge Instructions: Gently cleanse wound with antibacterial soap, rinse and pat dry prior to dressing wounds Cleanser: Wound Cleanser 3  x Per Week/15 Days Discharge Instructions: Wash your hands with soap and water. Remove old dressing, discard into plastic bag and place into trash. Cleanse the wound with Wound Cleanser prior to applying a clean dressing using gauze sponges, not tissues or cotton balls. Do not scrub or use excessive force. Pat dry using gauze sponges, not tissue or cotton balls. Topical: Triamcinolone Acetonide Cream, 0.1%, 15 (g) tube 3 x Per Week/15 Days Discharge Instructions: Mixed with half Eucerin or alternative-to bilateral lower legsApply as directed by provider. Topical: Eucerin lotion 3 x Per Week/15 Days Discharge Instructions: Mixed 1/2 with TCA to bilateral lower legs Primary Dressing: Iodosorb 40 (g) 3 x Per Week/15 Days Discharge Instructions: Apply IodoSorb to wound bed only as directed. Secondary Dressing: (NON-BORDER) Zetuvit Plus Silicone NON-BORDER 5x5 (in/in) 3 x Per Week/15 Days Compression Wrap: 3-LAYER WRAP - Profore Lite LF 3 Multilayer Compression Bandaging System 3 x Per Week/15 Days Discharge Instructions: Apply 3 multi-layer wrap as prescribed. Patient Medications Allergies: No Known Allergies Notifications Medication Indication Start End Dakin's Solution 06/30/2021 DOSE miscellaneous 0.25 % solution - Moisten gauze with Dakin's solution then wring out leaving gauze damp not saturated before packing daily into the wound as directed in clini Electronic Signature(s) Signed: 07/03/2021 2:46:35 PM By: Donnamarie Poag Signed: 07/03/2021 4:03:57 PM By: Worthy Keeler PA-C Previous Signature: 06/30/2021 4:56:09 PM Version By: Worthy Keeler PA-C Previous Signature: 06/30/2021 3:48:34 PM Version By: Donnamarie Poag Entered By: Donnamarie Poag on 07/03/2021 11:21:17 Wroe, Nanine Means ([id number]) -------------------------------------------------------------------------------- Problem List Details Patient Name: [name], [name] Date of Service: 06/30/2021 1:45 PM Medical Record Number:  [id number] Patient Account Number: [id number] Date of Birth/Sex: 1955/05/07 (66 y.o. M) Treating RN: Donnamarie Poag Primary Care Provider: Fulton Reek Other Clinician: Referring Provider: Fulton Reek Treating Provider/Extender: Skipper Cliche in Treatment: 5 Active Problems ICD-10 Encounter Code Description Active Date MDM Diagnosis [phone] Pressure ulcer of left heel, stage 3 05/26/2021 No Yes E11.621 Type 2 diabetes mellitus with foot ulcer 05/26/2021 No Yes L97.522 Non-pressure chronic ulcer of other part of left foot with fat layer 05/26/2021 No Yes exposed L89.893 Pressure ulcer of other site, stage 3 05/26/2021 No Yes I87.2 Venous insufficiency (chronic) (peripheral) 05/26/2021 No Yes I50.42 Chronic combined systolic (congestive) and diastolic (congestive) heart 05/26/2021 No Yes failure I10 Essential (primary) hypertension 05/26/2021 No Yes L97.818 Non-pressure chronic ulcer of other part of right lower leg with other 06/16/2021 No Yes specified severity Inactive Problems Resolved Problems Electronic Signature(s) Signed: 06/30/2021 2:07:02 PM By: Worthy Keeler PA-C Entered By: Worthy Keeler on 06/30/2021 14:07:02 Beals, Nanine Means ([id number]) -------------------------------------------------------------------------------- Progress Note Details Patient Name: [name], [name] Date of Service: 06/30/2021 1:45 PM Medical Record Number: [id number] Patient Account Number: [id number] Date of Birth/Sex: [birth year] (66 y.o. M) Treating RN: Donnamarie Poag Primary Care Provider: Fulton Reek Other Clinician: Referring Provider: Fulton Reek Treating Provider/Extender: Skipper Cliche in Treatment: 5 Subjective Chief Complaint Information obtained from Patient Bilateral LE Ulcers History of Present Illness (HPI) Admission 8/31 Mr. Butch  longus is a 66 year old male with a past medical history of uncontrolled insulin-dependent type 2 diabetes, CAD, osteomyelitis to  the left third toe status post partial third ray amputation, and peripheral arterial disease that presents for a left plantar foot wound and bilateral lower extremity wounds. The left plantar foot wound has been present for at least 7 months. He has been following podiatry for this issue. He uses silver collagen to this area. He reports that in the past 1 to 2 months he has developed multiple scattered open wounds to his lower extremities bilaterally that have worsened over time. He states that they often develop as either blisters or he hits his leg against an object.He is currently using Betadine to these areas. He denies signs of infection. He has compression stockings however he cannot put these on very easily. Also he is on his feet most of the day. He states he does not have anyone to help him do the activities he needs to get done. Readmission: 05/26/2021 upon evaluation today patient presents for reevaluation here in the clinic concerning issues he is been having with his bilateral lower extremities, his right great toe, and left heel primarily. With that being said he has been in the hospital recently and his hemoglobin A1c on January 10 was 9.2. With that being said he does have a lot of really thickened dry skin attached to his legs when he try to remove that today. He is in agreement with the plan. Nonetheless I do believe that based on what we are seeing he is been having a lot of issues with his congestive heart failure I do have him on fluid pills he does go to the bathroom quite a bit. Subsequently has been in [employer] but I do not think that is doing quite enough to help control his edema. He also needs something better than Santyl to put on the heel right now when he was in the hospital they actually cut a space open in the compression around his heel so they can open it up and actually apply the Santyl daily. I think he needs something better than that. Again the patient does  have a history of diabetes mellitus type 2, chronic venous insufficiency, hypertension, and congestive heart failure. He also has signs of stage III lymphedema as well. 06/02/2021 upon evaluation today patient appears to be doing decently well in regard to his wounds. Fortunately I do not see any signs of active infection at this time which is great news. Fortunately I think that he is making excellent progress. Nonetheless I do think that we seem to be making some progress here. 06/09/2021 upon evaluation today patient appears to be doing well with regard to his legs all things considered unfortunately the left heel is not doing nearly as well as what I like to see. There is a lot of necrotic tissue this is very boggy. I think that it is going to require potentially much more debridement but again we really need to get the results of the arterial testing done and this is actually scheduled for the 15th. 2/17; arrives in clinic today with the left plantar heel wound completely necrotic and malodorous. Comparing the picture with last week this looks quite a bit worse. He has been using Iodoflex under compression. He has an area on the right first toe which seems clean and better and a new area on the right anterior mid Tibia. I was unable to find the results of  his arterial studies that he had done on 1/15. Presumably they have not been read. I can see that they have been done 06/23/2021 upon evaluation today patient appears to be doing well currently in regard to his legs. I am actually very pleased in that regard. Fortunately I do not see any signs of active infection at this time. I have reviewed his arterial study and it appeared to be pretty good with TBI's on the left of 0.88 on the right 0.87. Overall I think that we are definitely headed in the right direction which is great news. Fortunately I do not see any signs of active infection locally or systemically at this time. 06/30/2021 upon evaluation  today patient appears to be doing well with regard to the right lower extremity ulcers with very pleased in this regard. I do feel like the Iodoflex has done well here. In regard to the left heel location this is something that I am actually a little bit more concerned about I really think that switching over to the Dakin's moistened gauze dressing may be the best way to go at this point based on what we are seeing. We have been using Iodoflex is just not clearing up quite as well as I would like to see and I think this may be a better method at which to get things under better control. The good news is he does have his Velcro compression wrap so we can try that at this point as well. Objective Constitutional Alcantar, Rorik A. ([id number]) Obese and well-hydrated in no acute distress. Vitals Time Taken: 1:55 PM, Height: 73 in, Weight: 371 lbs, BMI: 48.9, Temperature: 98.2 F, Pulse: 105 bpm, Respiratory Rate: 18 breaths/min, Blood Pressure: 148/77 mmHg. Respiratory normal breathing without difficulty. Psychiatric this patient is able to make decisions and demonstrates good insight into disease process. Alert and Oriented x 3. pleasant and cooperative. General Notes: Upon inspection patient's wound bed actually showed signs of fairly good granulation and epithelization in regard to the right wounds and no problem to keep things the same here. On the left leg we are going to go ahead and switch over to the Dakin's moistened gauze dressing we did actually show them how to do that today I Am going to send in the prescription as well. Integumentary (Hair, Skin) Wound #11 status is Open. Original cause of wound was Gradually Appeared. The date acquired was: 06/16/2021. The wound has been in treatment 2 weeks. The wound is located on the Right Lower Leg. The wound measures 0.5cm length x 0.6cm width x 0.1cm depth; 0.236cm^2 area and 0.024cm^3 volume. There is Fat Layer (Subcutaneous Tissue) exposed.  There is no tunneling or undermining noted. There is a medium amount of serosanguineous drainage noted. There is large (67-100%) red, pale granulation within the wound bed. There is a small (1-33%) amount of necrotic tissue within the wound bed including Adherent Slough. Wound #7 status is Open. Original cause of wound was Gradually Appeared. The date acquired was: 05/05/2021. The wound has been in treatment 5 weeks. The wound is located on the Left Calcaneus. The wound measures 3.2cm length x 4.2cm width x 1.6cm depth; 10.556cm^2 area and 16.889cm^3 volume. There is Fat Layer (Subcutaneous Tissue) exposed. There is no tunneling noted, however, there is undermining starting at 12:00 and ending at 12:00 with a maximum distance of 0.8cm. There is a large amount of serosanguineous drainage noted. The wound margin is thickened. There is small (1-33%) pink granulation within the wound bed.  There is a large (67-100%) amount of necrotic tissue within the wound bed including Eschar and Adherent Slough. Wound #9 status is Open. Original cause of wound was Gradually Appeared. The date acquired was: 04/26/2021. The wound has been in treatment 5 weeks. The wound is located on the Right Toe Great. The wound measures 0.6cm length x 0.5cm width x 0.1cm depth; 0.236cm^2 area and 0.024cm^3 volume. There is Fat Layer (Subcutaneous Tissue) exposed. There is no tunneling or undermining noted. There is a medium amount of serosanguineous drainage noted. There is large (67-100%) red, pink granulation within the wound bed. There is a small (1-33%) amount of necrotic tissue within the wound bed including Adherent Slough. Other Condition(s) Patient presents with Lymphedema located on the Bilateral Leg. Assessment Active Problems ICD-10 Pressure ulcer of left heel, stage 3 Type 2 diabetes mellitus with foot ulcer Non-pressure chronic ulcer of other part of left foot with fat layer exposed Pressure ulcer of other site,  stage 3 Venous insufficiency (chronic) (peripheral) Chronic combined systolic (congestive) and diastolic (congestive) heart failure Essential (primary) hypertension Non-pressure chronic ulcer of other part of right lower leg with other specified severity Procedures Wound #11 Pre-procedure diagnosis of Wound #11 is a Venous Leg Ulcer located on the Right Lower Leg . There was a Three Layer Compression Therapy Procedure by Donnamarie Poag, RN. Post procedure Diagnosis Wound #11: Same as Pre-Procedure Plan Dakin, [place] ([id number]) Follow-up Appointments: Return Appointment in 2 weeks. - at request Nurse Visit as needed - [facility] will change week of 3/6 Home Health: [facility]: - [facility] for wound care. May utilize formulary equivalent dressing for wound treatment orders unless otherwise specified. Home Health Nurse may visit PR[facility] to address patient s wound care needs. Scheduled days for dressing changes to be completed; exception, patient has scheduled wound care visit that day. - **No wound center appt week of 3/6 HH to wrap twice weekly and at wound center one time unless he is unable to have appt at wound center that week then 3 x **Please direct any NON-WOUND related issues/requests for orders to patient's Primary Care Physician. **If current dressing causes regression in wound condition, may D/C ordered dressing product/s and apply Normal Saline Moist Dressing daily until next [name] or Other MD appointment. **Notify Wound Healing Center of regression in wound condition at [phone]. Bathing/ Shower/ Hygiene: May shower with wound dressing protected with water repellent cover or cast protector. No tub bath. Anesthetic (Use 'Patient Medications' Section for Anesthetic Order Entry): Lidocaine applied to wound bed Edema Control - Lymphedema / Segmental Compressive Device / Other: Optional: One layer of unna paste to top of compression wrap  (to act as an anchor). Tubigrip single layer applied. - left leg in place of the sock that comes with Juxtelite-size F Elevate legs to the level of the heart and pump ankles as often as possible Elevate leg(s) parallel to the floor when sitting. DO YOUR BEST to sleep in the bed at night. DO NOT sleep in your recliner. Long hours of sitting in a recliner leads to swelling of the legs and/or potential wounds on your backside. Non-Wound Condition: Additional non-wound orders/instructions: - Triamcinolone mixed with lotion to bilateral lower legs gauze between toes to keep dry; bandaid to cover newly healed skin to toe Off-Loading: Open toe surgical shoe with peg assist. - L heel wedge shoe Recommend knee scooter Turn and reposition every 2 hours - keep pressure off of left heel Additional Orders /  Instructions: Follow Nutritious Diet and Increase Protein Intake - monitor blood sugar Other: - Recommend urea cream for legs Medications-Please add to medication list.: P.O. Antibiotics - Pick up extended amount of antibiotic and take as directed for the heel wound The following medication(s) was prescribed: Dakin's Solution miscellaneous 0.25 % solution Moisten gauze with Dakin's solution then wring out leaving gauze damp not saturated before packing daily into the wound as directed in clini starting 06/30/2021 WOUND #11: - Lower Leg Wound Laterality: Right Cleanser: Normal Saline 3 x Per Week/15 Days Discharge Instructions: Wash your hands with soap and water. Remove old dressing, discard into plastic bag and place into trash. Cleanse the wound with Normal Saline prior to applying a clean dressing using gauze sponges, not tissues or cotton balls. Do not scrub or use excessive force. Pat dry using gauze sponges, not tissue or cotton balls. Cleanser: Soap and Water 3 x Per Week/15 Days Discharge Instructions: Gently cleanse wound with antibacterial soap, rinse and pat dry prior to dressing  wounds Cleanser: Wound Cleanser 3 x Per Week/15 Days Discharge Instructions: Wash your hands with soap and water. Remove old dressing, discard into plastic bag and place into trash. Cleanse the wound with Wound Cleanser prior to applying a clean dressing using gauze sponges, not tissues or cotton balls. Do not scrub or use excessive force. Pat dry using gauze sponges, not tissue or cotton balls. Topical: Triamcinolone Acetonide Cream, 0.1%, 15 (g) tube 3 x Per Week/15 Days Discharge Instructions: Mixed with half Eucerin or alternative-to bilateral lower legsApply as directed by provider. Topical: Eucerin lotion 3 x Per Week/15 Days Discharge Instructions: Mixed 1/2 with TCA to bilateral lower legs Primary Dressing: Iodosorb 40 (g) 3 x Per Week/15 Days Discharge Instructions: Apply IodoSorb to wound bed only as directed. Secondary Dressing: (NON-BORDER) Zetuvit Plus Silicone NON-BORDER 5x5 (in/in) 3 x Per Week/15 Days Compression Wrap: 3-LAYER WRAP - Profore Lite LF 3 Multilayer Compression Bandaging System 3 x Per Week/15 Days Discharge Instructions: Apply 3 multi-layer wrap as prescribed. Compression Stockings: Circaid Juxta Lite Compression Wrap Compression Amount: 30-40 mmHg (right) Discharge Instructions: Apply Circaid Juxta Lite Compression Wrap as directed WOUND #7: - Calcaneus Wound Laterality: Left Cleanser: Dakin 16 (oz) 0.25 1 x Per Day/15 Days Discharge Instructions: Use as directed. Cleanser: Normal Saline 1 x Per PXT/06 Days Discharge Instructions: Wash your hands with soap and water. Remove old dressing, discard into plastic bag and place into trash. Cleanse the wound with Normal Saline prior to applying a clean dressing using gauze sponges, not tissues or cotton balls. Do not scrub or use excessive force. Pat dry using gauze sponges, not tissue or cotton balls. Cleanser: Soap and Water 1 x Per Day/15 Days Discharge Instructions: Gently cleanse wound with antibacterial soap,  rinse and pat dry prior to dressing wounds Cleanser: Wound Cleanser 1 x Per Day/15 Days Discharge Instructions: Wash your hands with soap and water. Remove old dressing, discard into plastic bag and place into trash. Cleanse the wound with Wound Cleanser prior to applying a clean dressing using gauze sponges, not tissues or cotton balls. Do not scrub or use excessive force. Pat dry using gauze sponges, not tissue or cotton balls. Topical: Triamcinolone Acetonide Cream, 0.1%, 15 (g) tube 1 x Per Day/15 Days Discharge Instructions: Mixed with half Eucerin or alternative-to bilateral lower legsApply as directed by provider. Topical: Eucerin lotion 1 x Per Day/15 Days Discharge Instructions: Mixed 1/2 with TCA to bilateral lower legs Primary Dressing: Gauze 1 x Per Day/15  Days Valone, [name] ([id number]) Discharge Instructions: Dakins lightly soaked gauze Secondary Dressing: (NON-BORDER) Zetuvit Plus Silicone NON-BORDER 5x5 (in/in) 1 x Per Day/15 Days Secured With: Medipore Tape - 38M Medipore H Soft Cloth Surgical Tape, 2x2 (in/yd) 1 x Per Day/15 Days Secured With: Kerlix Roll Sterile or Non-Sterile 6-ply 4.5x4 (yd/yd) 1 x Per Day/15 Days Discharge Instructions: Apply Kerlix as directed Compression Wrap: Left leg JUXTELITE WITH TUBI GRIP UNDER 1 x Per Day/15 Days WOUND #9: - Toe Great Wound Laterality: Right Cleanser: Normal Saline 3 x Per Week/15 Days Discharge Instructions: Wash your hands with soap and water. Remove old dressing, discard into plastic bag and place into trash. Cleanse the wound with Normal Saline prior to applying a clean dressing using gauze sponges, not tissues or cotton balls. Do not scrub or use excessive force. Pat dry using gauze sponges, not tissue or cotton balls. Cleanser: Soap and Water 3 x Per Week/15 Days Discharge Instructions: Gently cleanse wound with antibacterial soap, rinse and pat dry prior to dressing wounds Cleanser: Wound Cleanser 3 x Per Week/15  Days Discharge Instructions: Wash your hands with soap and water. Remove old dressing, discard into plastic bag and place into trash. Cleanse the wound with Wound Cleanser prior to applying a clean dressing using gauze sponges, not tissues or cotton balls. Do not scrub or use excessive force. Pat dry using gauze sponges, not tissue or cotton balls. Topical: Triamcinolone Acetonide Cream, 0.1%, 15 (g) tube 3 x Per Week/15 Days Discharge Instructions: Mixed with half Eucerin or alternative-to bilateral lower legsApply as directed by provider. Topical: Eucerin lotion 3 x Per Week/15 Days Discharge Instructions: Mixed 1/2 with TCA to bilateral lower legs Primary Dressing: Iodosorb 40 (g) 3 x Per Week/15 Days Discharge Instructions: Apply IodoSorb to wound bed only as directed. Secondary Dressing: (NON-BORDER) Zetuvit Plus Silicone NON-BORDER 5x5 (in/in) 3 x Per Week/15 Days Compression Wrap: 3-LAYER WRAP - Profore Lite LF 3 Multilayer Compression Bandaging System 3 x Per Week/15 Days Discharge Instructions: Apply 3 multi-layer wrap as prescribed. 1. I am going to initiate treatment with Dakin's moistened gauze dressing to the left heel I think this is going to do much better to help clean up this quite effectively. 2. I am also can recommend that we go ahead and utilize the Iodosorb to the right leg were also going to continue with the compression wrap on the right leg. 3. In regard to the left leg I am going to suggest that we use his Velcro compression wrap I think this will be a much better way to go and the patient is in agreement with plan this will allow for the Dakin's moistened gauze dressing to be done daily. We will see patient back for reevaluation in 2 weeks here in the clinic. If anything worsens or changes patient will contact our office for additional recommendations. Electronic Signature(s) Signed: 06/30/2021 4:57:29 PM By: Worthy Keeler PA-C Entered By: Worthy Keeler on  06/30/2021 16:57:29 Khader, Nanine Means ([id number]) -------------------------------------------------------------------------------- SuperBill Details Patient Name: Alec Schooner A. Date of Service: 06/30/2021 Medical Record Number: [id number] Patient Account Number: [id number] Date of Birth/Sex: [birth year] (66 y.o. M) Treating RN: Donnamarie Poag Primary Care Provider: Fulton Reek Other Clinician: Referring Provider: Fulton Reek Treating Provider/Extender: Skipper Cliche in Treatment: 5 Diagnosis Coding ICD-10 Codes Code Description [phone] Pressure ulcer of left heel, stage 3 E11.621 Type 2 diabetes mellitus with foot ulcer L97.522 Non-pressure chronic ulcer of other part of left foot with fat layer exposed  K06.349 Pressure ulcer of other site, stage 3 I87.2 Venous insufficiency (chronic) (peripheral) I50.42 Chronic combined systolic (congestive) and diastolic (congestive) heart failure I10 Essential (primary) hypertension L97.818 Non-pressure chronic ulcer of other part of right lower leg with other specified severity Facility Procedures CPT4 Code: [id number] Description: (Facility Use Only) [phone] - APPLY MULTLAY COMPRS LWR RT LEG Modifier: Quantity: 1 Physician Procedures CPT4 Code: [id number] Description: 72550 - WC PHYS LEVEL 4 - EST PT Modifier: Quantity: 1 CPT4 Code: Description: ICD-10 Diagnosis Description L89.623 Pressure ulcer of left heel, stage 3 E11.621 Type 2 diabetes mellitus with foot ulcer L97.522 Non-pressure chronic ulcer of other part of left foot with fat layer exp L89.893 Pressure ulcer of other site,  stage 3 Modifier: osed Quantity: Electronic Signature(s) Signed: 06/30/2021 4:57:53 PM By: Worthy Keeler PA-C Previous Signature: 06/30/2021 3:48:34 PM Version By: Donnamarie Poag Entered By: Worthy Keeler on 06/30/2021 16:57:53

## 2021-06-30 NOTE — Progress Notes (Signed)
IKE, MARAGH ([id number]) Visit Report for 06/30/2021 Arrival Information Details Patient Name: [name], [name] Date of Service: 06/30/2021 1:45 PM Medical Record Number: [id number] Patient Account Number: [id number] Date of Birth/Sex: Oct 02, 1955 (66 y.o. M) Treating RN: Donnamarie Poag Primary Care Dana Dorner: Fulton Reek Other Clinician: Referring Journe Hallmark: Fulton Reek Treating Chrystel Barefield/Extender: Skipper Cliche in Treatment: 5 Visit Information History Since Last Visit Added or deleted any medications: No Patient Arrived: Knee Scooter Had a fall or experienced change in No Arrival Time: 13:51 activities of daily living that may affect Accompanied By: wife risk of falls: Transfer Assistance: None Hospitalized since last visit: No Patient Identification Verified: Yes Has Dressing in Place as Prescribed: Yes Secondary Verification Process Completed: Yes Has Compression in Place as Prescribed: Yes Patient Requires Transmission-Based No Pain Present Now: Yes Precautions: Patient Has Alerts: Yes Patient Alerts: Patient on Blood Thinner DIABETIC Eliquis/Aspirin 81 ABI at [facility] 06/14/21 L 0.88; R 0.87 Electronic Signature(s) Signed: 06/30/2021 3:48:34 PM By: Donnamarie Poag Entered By: Donnamarie Poag on 06/30/2021 13:55:47 Sircy, [place]. ([id number]) -------------------------------------------------------------------------------- Compression Therapy Details Patient Name: [name], [name] Date of Service: 06/30/2021 1:45 PM Medical Record Number: [id number] Patient Account Number: [id number] Date of Birth/Sex: 28-Feb-1956 (66 y.o. M) Treating RN: Donnamarie Poag Primary Care Thressa Shiffer: Fulton Reek Other Clinician: Referring Reese Stockman: Fulton Reek Treating Desare Duddy/Extender: Skipper Cliche in Treatment: 5 Compression Therapy Performed for Wound Assessment: Wound #11 Right Lower Leg Performed By: Clinician Donnamarie Poag, RN Compression Type: Three Layer Post  Procedure Diagnosis Same as Pre-procedure Electronic Signature(s) Signed: 06/30/2021 3:48:34 PM By: Donnamarie Poag Entered By: Donnamarie Poag on 06/30/2021 14:26:01 Bents, Nanine Means ([id number]) -------------------------------------------------------------------------------- Encounter Discharge Information Details Patient Name: Alec Schooner A. Date of Service: 06/30/2021 1:45 PM Medical Record Number: [id number] Patient Account Number: [id number] Date of Birth/Sex: Aug 07, 1955 (66 y.o. M) Treating RN: Donnamarie Poag Primary Care Lavita Pontius: Fulton Reek Other Clinician: Referring Xaviar Lunn: Fulton Reek Treating Ronny Korff/Extender: Skipper Cliche in Treatment: 5 Encounter Discharge Information Items Discharge Condition: Stable Ambulatory Status: Knee Scooter Discharge Destination: Home Health Telephoned: No Orders Sent: Yes Transportation: Private Auto Accompanied By: wife Schedule Follow-up Appointment: Yes Clinical Summary of Care: Electronic Signature(s) Signed: 06/30/2021 3:48:34 PM By: Donnamarie Poag Entered By: Donnamarie Poag on 06/30/2021 14:34:49 Kendra, Nanine Means ([id number]) -------------------------------------------------------------------------------- Lower Extremity Assessment Details Patient Name: [name], [name] Date of Service: 06/30/2021 1:45 PM Medical Record Number: [id number] Patient Account Number: [id number] Date of Birth/Sex: [birth year] (66 y.o. M) Treating RN: Donnamarie Poag Primary Care Willette Mudry: Fulton Reek Other Clinician: Referring Jerzi Tigert: Fulton Reek Treating Loda Bialas/Extender: Skipper Cliche in Treatment: 5 Edema Assessment Assessed: [Left: Yes] [Right: Yes] Edema: [Left: Yes] [Right: Yes] Calf Left: Right: Point of Measurement: 36 cm From Medial Instep 47 cm 49 cm Ankle Left: Right: Point of Measurement: 12 cm From Medial Instep 29 cm 28 cm Knee To Floor Left: Right: From Medial Instep 47 cm 47 cm Electronic Signature(s) Signed:  06/30/2021 3:48:34 PM By: Donnamarie Poag Entered By: Donnamarie Poag on 06/30/2021 14:15:43 Pfiester, Nanine Means ([id number]) -------------------------------------------------------------------------------- Multi Wound Chart Details Patient Name: Lucus, [name] Date of Service: 06/30/2021 1:45 PM Medical Record Number: [id number] Patient Account Number: [id number] Date of Birth/Sex: 03/29/1956 (66 y.o. M) Treating RN: Donnamarie Poag Primary Care Bearl Talarico: Fulton Reek Other Clinician: Referring Radley Teston: Fulton Reek Treating Joran Kallal/Extender: Skipper Cliche in Treatment: 5 Vital Signs Height(in): 73 Pulse(bpm): 105 Weight(lbs): 557 Blood Pressure(mmHg): 148/77 Body Mass Index(BMI): 48.9 Temperature(F): 98.2 Respiratory Rate(breaths/min): 18 Photos: Wound Location: Right Lower Leg Left  Calcaneus Right Toe Great Wounding Event: Gradually Appeared Gradually Appeared Gradually Appeared Primary Etiology: Venous Leg Ulcer Pressure Ulcer Pressure Ulcer Secondary Etiology: [name] [name] Diabetic Wound/Ulcer of the Lower Extremity Comorbid History: Lymphedema, Asthma, Chronic Lymphedema, Asthma, Chronic Lymphedema, Asthma, Chronic Obstructive Pulmonary Disease Obstructive Pulmonary Disease Obstructive Pulmonary Disease (COPD), Sleep Apnea, Arrhythmia, (COPD), Sleep Apnea, Arrhythmia, (COPD), Sleep Apnea, Arrhythmia, Congestive Heart Failure, Coronary Congestive Heart Failure, Coronary Congestive Heart Failure, Coronary Artery Disease, Hypertension, Artery Disease, Hypertension, Artery Disease, Hypertension, Myocardial Infarction, Peripheral Myocardial Infarction, Peripheral Myocardial Infarction, Peripheral Venous Disease, Colitis, Type II Venous Disease, Colitis, Type II Venous Disease, Colitis, Type II Diabetes, History of pressure Diabetes, History of pressure Diabetes, History of pressure wounds, Gout, Osteoarthritis wounds, Gout, Osteoarthritis wounds, Gout, Osteoarthritis Date Acquired:  06/16/2021 05/05/2021 04/26/2021 Weeks of Treatment: _0 Wound Status: Open Open Open Wound Recurrence: No No No Pending Amputation on No Yes Yes Presentation: Measurements L x W x D (cm) 0.5x0.6x0.1 3.2x4.2x1.6 0.6x0.5x0.1 Area (cm) : 0.236 10.556 0.236 Volume (cm) : 0.024 16.889 0.024 % Reduction in Area: -50.30% -85.10% 62.40% % Reduction in Volume: -50.00% -196.20% 61.90% Starting Position 1 (o'clock): 12 Ending Position 1 (o'clock): 12 Maximum Distance 1 (cm): 0.8 Undermining: No Yes No Classification: Full Thickness Without Exposed Category/Stage III Category/Stage III Support Structures Exudate Amount: Medium Large Medium Exudate Type: Serosanguineous Serosanguineous Serosanguineous Exudate Color: red, brown red, brown red, brown Wound Margin: [name] Thickened [name] Granulation Amount: Large (67-100%) Small (1-33%) Large (67-100%) Granulation Quality: Red, Pale Pink Red, Pink Necrotic Amount: Small (1-33%) Large (67-100%) Small (1-33%) Necrotic Tissue: Adherent Slough Eschar, Adherent [place] Exposed Structures: Fat Layer (Subcutaneous Tissue): Fat Layer (Subcutaneous Tissue): Fat Layer (Subcutaneous Tissue): Yes Yes Yes Fascia: No Fascia: No Fascia: No Tendon: No Tendon: No Tendon: No Audia, Kelcy A. ([id number]) Muscle: No Muscle: No Muscle: No Joint: No Joint: No Joint: No Bone: No Bone: No Bone: No Treatment Notes Electronic Signature(s) Signed: 06/30/2021 3:48:34 PM By: Donnamarie Poag Entered By: Donnamarie Poag on 06/30/2021 14:25:42 Kober, Nanine Means ([id number]) -------------------------------------------------------------------------------- Multi-Disciplinary Care Plan Details Patient Name: Alec Schooner A. Date of Service: 06/30/2021 1:45 PM Medical Record Number: [id number] Patient Account Number: [id number] Date of Birth/Sex: 09-12-1955 (66 y.o. M) Treating RN: Donnamarie Poag Primary Care Huntley Knoop: Fulton Reek Other  Clinician: Referring Yentl Verge: Fulton Reek Treating Cleon Signorelli/Extender: Skipper Cliche in Treatment: 5 Active Inactive Wound/Skin Impairment Nursing Diagnoses: Impaired tissue integrity Knowledge deficit related to smoking impact on wound healing Knowledge deficit related to ulceration/compromised skin integrity Goals: Patient will have a decrease in wound volume by X% from date: (specify in notes) Date Initiated: 05/26/2021 Target Resolution Date: 06/23/2021 Goal Status: Active Patient/caregiver will verbalize understanding of skin care regimen Date Initiated: 05/26/2021 Date Inactivated: 06/02/2021 Target Resolution Date: 06/09/2021 Goal Status: Met Interventions: Assess patient/caregiver ability to obtain necessary supplies Assess patient/caregiver ability to perform ulcer/skin care regimen upon admission and as needed Notes: Electronic Signature(s) Signed: 06/30/2021 3:48:34 PM By: Donnamarie Poag Entered By: Donnamarie Poag on 06/30/2021 14:24:51 Mallery, [place]. ([id number]) -------------------------------------------------------------------------------- Non-Wound Condition Assessment Details Patient Name: [name], [name] Date of Service: 06/30/2021 1:45 PM Medical Record Number: [id number] Patient Account Number: [id number] Date of Birth/Sex: 12/12/1955 (66 y.o. M) Treating RN: Donnamarie Poag Primary Care Aino Heckert: Fulton Reek Other Clinician: Referring Iman Reinertsen: Fulton Reek Treating Adriell Polansky/Extender: Jeri Cos Weeks in Treatment: 5 Non-Wound Condition: Condition: Lymphedema Location: Leg Side: Bilateral Photos Electronic Signature(s) Signed: 06/30/2021 3:48:34 PM By: Donnamarie Poag Entered ByDonnamarie Poag on 06/30/2021 14:13:44  Tiede, JOHANNES EVERAGE ([id number]) -------------------------------------------------------------------------------- Pain Assessment Details Patient Name: [name], [name] Date of Service: 06/30/2021 1:45 PM Medical Record Number:  [id number] Patient Account Number: [id number] Date of Birth/Sex: [birth year] (66 y.o. M) Treating RN: Donnamarie Poag Primary Care Shameeka Silliman: Fulton Reek Other Clinician: Referring Adrain Nesbit: Fulton Reek Treating Nixon Sparr/Extender: Skipper Cliche in Treatment: 5 Active Problems Location of Pain Severity and Description of Pain Patient Has Paino Yes Site Locations Pain Location: Pain in Ulcers Rate the pain. Current Pain Level: 3 Pain Management and Medication Current Pain Management: Electronic Signature(s) Signed: 06/30/2021 3:48:34 PM By: Donnamarie Poag Entered By: Donnamarie Poag on 06/30/2021 13:56:12 Labra, Nanine Means ([id number]) -------------------------------------------------------------------------------- Patient/Caregiver Education Details Patient Name: Alec Schooner A. Date of Service: 06/30/2021 1:45 PM Medical Record Number: [id number] Patient Account Number: [id number] Date of Birth/Gender: Mar 04, 1956 (66 y.o. M) Treating RN: Donnamarie Poag Primary Care Physician: Fulton Reek Other Clinician: Referring Physician: Fulton Reek Treating Physician/Extender: Skipper Cliche in Treatment: 5 Education Assessment Education Provided To: Patient Education Topics Provided Basic Hygiene: Wound/Skin Impairment: Electronic Signature(s) Signed: 06/30/2021 3:48:34 PM By: Donnamarie Poag Entered By: Donnamarie Poag on 06/30/2021 14:27:39 Duquesne, Nanine Means ([id number]) -------------------------------------------------------------------------------- Wound Assessment Details Patient Name: [name], [name] Date of Service: 06/30/2021 1:45 PM Medical Record Number: [id number] Patient Account Number: [id number] Date of Birth/Sex: 06-Oct-1955 (66 y.o. M) Treating RN: Donnamarie Poag Primary Care Destenie Ingber: Fulton Reek Other Clinician: Referring Jeena Arnett: Fulton Reek Treating Emmah Bratcher/Extender: Skipper Cliche in Treatment: 5 Wound Status Wound Number: 11 Primary Venous Leg  Ulcer Etiology: Wound Location: Right Lower Leg Wound Open Wounding Event: Gradually Appeared Status: Date Acquired: 06/16/2021 Comorbid Lymphedema, Asthma, Chronic Obstructive Pulmonary Weeks Of Treatment: 2 History: Disease (COPD), Sleep Apnea, Arrhythmia, Congestive Clustered Wound: No Heart Failure, Coronary Artery Disease, Hypertension, Myocardial Infarction, Peripheral Venous Disease, Colitis, Type II Diabetes, History of pressure wounds, Gout, Osteoarthritis Photos Wound Measurements Length: (cm) 0.5 Width: (cm) 0.6 Depth: (cm) 0.1 Area: (cm) 0.236 Volume: (cm) 0.024 % Reduction in Area: -50.3% % Reduction in Volume: -50% Tunneling: No Undermining: No Wound Description Classification: Full Thickness Without Exposed Support Structu Exudate Amount: Medium Exudate Type: Serosanguineous Exudate Color: red, brown res Foul Odor After Cleansing: No Slough/Fibrino No Wound Bed Granulation Amount: Large (67-100%) Exposed Structure Granulation Quality: Red, Pale Fascia Exposed: No Necrotic Amount: Small (1-33%) Fat Layer (Subcutaneous Tissue) Exposed: Yes Necrotic Quality: Adherent Slough Tendon Exposed: No Muscle Exposed: No Joint Exposed: No Bone Exposed: No Treatment Notes Wound #11 (Lower Leg) Wound Laterality: Right Cleanser Normal Saline Karger, Jama A. ([id number]) Discharge Instruction: Wash your hands with soap and water. Remove old dressing, discard into plastic bag and place into trash. Cleanse the wound with Normal Saline prior to applying a clean dressing using gauze sponges, not tissues or cotton balls. Do not scrub or use excessive force. Pat dry using gauze sponges, not tissue or cotton balls. Soap and Water Discharge Instruction: Gently cleanse wound with antibacterial soap, rinse and pat dry prior to dressing wounds Wound Cleanser Discharge Instruction: Wash your hands with soap and water. Remove old dressing, discard into plastic bag and  place into trash. Cleanse the wound with Wound Cleanser prior to applying a clean dressing using gauze sponges, not tissues or cotton balls. Do not scrub or use excessive force. Pat dry using gauze sponges, not tissue or cotton balls. Peri-Wound Care Topical Triamcinolone Acetonide Cream, 0.1%, 15 (g) tube Discharge Instruction: Mixed with half Eucerin or alternative-to bilateral lower legsApply as directed by Zaidyn Claire. Eucerin lotion Discharge Instruction:  Mixed 1/2 with TCA to bilateral lower legs Primary Dressing Iodosorb 40 (g) Discharge Instruction: Apply IodoSorb to wound bed only as directed. Secondary Dressing Zetuvit Plus Silicone Non-bordered 5x5 (in/in) Secured With Compression Wrap Profore Lite LF 3 Multilayer Compression Bandaging System Discharge Instruction: Apply 3 multi-layer wrap as prescribed. Compression Stockings Circaid Juxta Lite Compression Wrap Quantity: 1 Right Leg Compression Amount: 30-40 mmHg Discharge Instruction: Apply Circaid Juxta Lite Compression Wrap as directed Add-Ons Electronic Signature(s) Signed: 06/30/2021 3:48:34 PM By: Donnamarie Poag Entered By: Donnamarie Poag on 06/30/2021 14:12:47 Forsman, Nanine Means ([id number]) -------------------------------------------------------------------------------- Wound Assessment Details Patient Name: [name], [name] Date of Service: 06/30/2021 1:45 PM Medical Record Number: [id number] Patient Account Number: [id number] Date of Birth/Sex: 24-Jan-1956 (66 y.o. M) Treating RN: Donnamarie Poag Primary Care Dorthula Bier: Fulton Reek Other Clinician: Referring Numan Zylstra: Fulton Reek Treating Gerda Yin/Extender: Skipper Cliche in Treatment: 5 Wound Status Wound Number: 7 Primary Pressure Ulcer Etiology: Wound Location: Left Calcaneus Wound Open Wounding Event: Gradually Appeared Status: Date Acquired: 05/05/2021 Comorbid Lymphedema, Asthma, Chronic Obstructive Pulmonary Weeks Of Treatment: 5 History: Disease  (COPD), Sleep Apnea, Arrhythmia, Congestive Clustered Wound: No Heart Failure, Coronary Artery Disease, Hypertension, Pending Amputation On Presentation Myocardial Infarction, Peripheral Venous Disease, Colitis, Type II Diabetes, History of pressure wounds, Gout, Osteoarthritis Photos Wound Measurements Length: (cm) 3.2 % Redu Width: (cm) 4.2 % Redu Depth: (cm) 1.6 Tunnel Area: (cm) 10.556 Under Volume: (cm) 16.889 Sta Endi Maxi ction in Area: -85.1% ction in Volume: -196.2% ing: No mining: Yes rting Position (o'clock): 12 ng Position (o'clock): 12 mum Distance: (cm) 0.8 Wound Description Classification: Category/Stage III Foul O Wound Margin: Thickened Slough Exudate Amount: Large Exudate Type: Serosanguineous Exudate Color: red, brown dor After Cleansing: No /Fibrino Yes Wound Bed Granulation Amount: Small (1-33%) Exposed Structure Granulation Quality: Pink Fascia Exposed: No Necrotic Amount: Large (67-100%) Fat Layer (Subcutaneous Tissue) Exposed: Yes Necrotic Quality: Eschar, Adherent Slough Tendon Exposed: No Muscle Exposed: No Joint Exposed: No Bone Exposed: No Treatment Notes Wound #7 (Calcaneus) Wound Laterality: Left Limes, Tyton A. ([id number]) Cleanser Dakin 16 (oz) 0.25 Discharge Instruction: Use as directed. Normal Saline Discharge Instruction: Wash your hands with soap and water. Remove old dressing, discard into plastic bag and place into trash. Cleanse the wound with Normal Saline prior to applying a clean dressing using gauze sponges, not tissues or cotton balls. Do not scrub or use excessive force. Pat dry using gauze sponges, not tissue or cotton balls. Soap and Water Discharge Instruction: Gently cleanse wound with antibacterial soap, rinse and pat dry prior to dressing wounds Wound Cleanser Discharge Instruction: Wash your hands with soap and water. Remove old dressing, discard into plastic bag and place into trash. Cleanse the wound  with Wound Cleanser prior to applying a clean dressing using gauze sponges, not tissues or cotton balls. Do not scrub or use excessive force. Pat dry using gauze sponges, not tissue or cotton balls. Peri-Wound Care Topical Triamcinolone Acetonide Cream, 0.1%, 15 (g) tube Discharge Instruction: Mixed with half Eucerin or alternative-to bilateral lower legsApply as directed by Kammy Klett. Eucerin lotion Discharge Instruction: Mixed 1/2 with TCA to bilateral lower legs Primary Dressing Gauze Discharge Instruction: Dakins lightly soaked gauze Secondary Dressing Zetuvit Plus Silicone Non-bordered 5x5 (in/in) Secured With Compression Wrap Profore Lite LF 3 Multilayer Compression Bandaging System Discharge Instruction: Apply 3 multi-layer wrap as prescribed. Compression Stockings Add-Ons Electronic Signature(s) Signed: 06/30/2021 3:48:34 PM By: Donnamarie Poag Entered By: Donnamarie Poag on 06/30/2021 14:10:36 Soderlund, Nanine Means ([id number]) -------------------------------------------------------------------------------- Wound Assessment Details Patient  Name: [name], [name] Date of Service: 06/30/2021 1:45 PM Medical Record Number: [id number] Patient Account Number: [id number] Date of Birth/Sex: 1955-06-16 (66 y.o. M) Treating RN: Donnamarie Poag Primary Care Jihan Rudy: Fulton Reek Other Clinician: Referring Larayne Baxley: Fulton Reek Treating Lanisa Ishler/Extender: Skipper Cliche in Treatment: 5 Wound Status Wound Number: 9 Primary Pressure Ulcer Etiology: Wound Location: Right Toe Great Secondary Diabetic Wound/Ulcer of the Lower Extremity Wounding Event: Gradually Appeared Etiology: Date Acquired: 04/26/2021 Wound Open Weeks Of Treatment: 5 Status: Clustered Wound: No Comorbid Lymphedema, Asthma, Chronic Obstructive Pulmonary Pending Amputation On Presentation History: Disease (COPD), Sleep Apnea, Arrhythmia, Congestive Heart Failure, Coronary Artery Disease, Hypertension, Myocardial  Infarction, Peripheral Venous Disease, Colitis, Type II Diabetes, History of pressure wounds, Gout, Osteoarthritis Photos Wound Measurements Length: (cm) 0.6 % Reduc Width: (cm) 0.5 % Reduc Depth: (cm) 0.1 Tunneli Area: (cm) 0.236 Underm Volume: (cm) 0.024 tion in Area: 62.4% tion in Volume: 61.9% ng: No ining: No Wound Description Classification: Category/Stage III Foul Od Exudate Amount: Medium Slough/ Exudate Type: Serosanguineous Exudate Color: red, brown or After Cleansing: No Fibrino Yes Wound Bed Granulation Amount: Large (67-100%) Exposed Structure Granulation Quality: Red, Pink Fascia Exposed: No Necrotic Amount: Small (1-33%) Fat Layer (Subcutaneous Tissue) Exposed: Yes Necrotic Quality: Adherent Slough Tendon Exposed: No Muscle Exposed: No Joint Exposed: No Bone Exposed: No Treatment Notes Wound #9 (Toe Great) Wound Laterality: Right Cleanser Drewes, Lavoris A. ([id number]) Normal Saline Discharge Instruction: Wash your hands with soap and water. Remove old dressing, discard into plastic bag and place into trash. Cleanse the wound with Normal Saline prior to applying a clean dressing using gauze sponges, not tissues or cotton balls. Do not scrub or use excessive force. Pat dry using gauze sponges, not tissue or cotton balls. Soap and Water Discharge Instruction: Gently cleanse wound with antibacterial soap, rinse and pat dry prior to dressing wounds Wound Cleanser Discharge Instruction: Wash your hands with soap and water. Remove old dressing, discard into plastic bag and place into trash. Cleanse the wound with Wound Cleanser prior to applying a clean dressing using gauze sponges, not tissues or cotton balls. Do not scrub or use excessive force. Pat dry using gauze sponges, not tissue or cotton balls. Peri-Wound Care Topical Triamcinolone Acetonide Cream, 0.1%, 15 (g) tube Discharge Instruction: Mixed with half Eucerin or alternative-to bilateral lower  legsApply as directed by Zev Blue. Eucerin lotion Discharge Instruction: Mixed 1/2 with TCA to bilateral lower legs Primary Dressing Iodosorb 40 (g) Discharge Instruction: Apply IodoSorb to wound bed only as directed. Secondary Dressing Zetuvit Plus Silicone Non-bordered 5x5 (in/in) Secured With Compression Wrap Profore Lite LF 3 Multilayer Compression Bandaging System Discharge Instruction: Apply 3 multi-layer wrap as prescribed. Compression Stockings Add-Ons Electronic Signature(s) Signed: 06/30/2021 3:48:34 PM By: Donnamarie Poag Entered By: Donnamarie Poag on 06/30/2021 14:11:51 Ewen, Nanine Means ([id number]) -------------------------------------------------------------------------------- Vitals Details Patient Name: [name], Edu A. Date of Service: 06/30/2021 1:45 PM Medical Record Number: [id number] Patient Account Number: [id number] Date of Birth/Sex: Jun 19, 1955 (66 y.o. M) Treating RN: Donnamarie Poag Primary Care Masiah Woody: Fulton Reek Other Clinician: Referring Faelyn Sigler: Fulton Reek Treating Charbel Los/Extender: Skipper Cliche in Treatment: 5 Vital Signs Time Taken: 13:55 Temperature (F): 98.2 Height (in): 73 Pulse (bpm): 105 Weight (lbs): 371 Respiratory Rate (breaths/min): 18 Body Mass Index (BMI): 48.9 Blood Pressure (mmHg): 148/77 Reference Range: 80 - 120 mg / dl Electronic Signature(s) Signed: 06/30/2021 3:48:34 PM By: Donnamarie Poag Entered ByDonnamarie Poag on 06/30/2021 13:56:04

## 2021-07-03 DIAGNOSIS — Z79899 Other long term (current) drug therapy: Secondary | ICD-10-CM | POA: Diagnosis not present

## 2021-07-03 DIAGNOSIS — E1121 Type 2 diabetes mellitus with diabetic nephropathy: Secondary | ICD-10-CM | POA: Diagnosis not present

## 2021-07-03 DIAGNOSIS — I2510 Atherosclerotic heart disease of native coronary artery without angina pectoris: Secondary | ICD-10-CM | POA: Diagnosis not present

## 2021-07-03 DIAGNOSIS — I10 Essential (primary) hypertension: Secondary | ICD-10-CM | POA: Diagnosis not present

## 2021-07-04 ENCOUNTER — Other Ambulatory Visit: Payer: Self-pay

## 2021-07-04 ENCOUNTER — Ambulatory Visit: Payer: Medicare Other | Attending: Family | Admitting: Family

## 2021-07-04 ENCOUNTER — Encounter: Payer: Self-pay | Admitting: Family

## 2021-07-04 VITALS — BP 106/66 | HR 106 | Resp 18 | Ht 76.0 in | Wt 368.2 lb

## 2021-07-04 DIAGNOSIS — I4891 Unspecified atrial fibrillation: Secondary | ICD-10-CM | POA: Diagnosis not present

## 2021-07-04 DIAGNOSIS — I480 Paroxysmal atrial fibrillation: Secondary | ICD-10-CM

## 2021-07-04 DIAGNOSIS — I2510 Atherosclerotic heart disease of native coronary artery without angina pectoris: Secondary | ICD-10-CM | POA: Diagnosis not present

## 2021-07-04 DIAGNOSIS — I5032 Chronic diastolic (congestive) heart failure: Secondary | ICD-10-CM | POA: Diagnosis not present

## 2021-07-04 DIAGNOSIS — Z794 Long term (current) use of insulin: Secondary | ICD-10-CM

## 2021-07-04 DIAGNOSIS — R251 Tremor, unspecified: Secondary | ICD-10-CM | POA: Insufficient documentation

## 2021-07-04 DIAGNOSIS — E1151 Type 2 diabetes mellitus with diabetic peripheral angiopathy without gangrene: Secondary | ICD-10-CM | POA: Insufficient documentation

## 2021-07-04 DIAGNOSIS — L03116 Cellulitis of left lower limb: Secondary | ICD-10-CM | POA: Insufficient documentation

## 2021-07-04 DIAGNOSIS — J449 Chronic obstructive pulmonary disease, unspecified: Secondary | ICD-10-CM | POA: Insufficient documentation

## 2021-07-04 DIAGNOSIS — M25572 Pain in left ankle and joints of left foot: Secondary | ICD-10-CM | POA: Insufficient documentation

## 2021-07-04 DIAGNOSIS — Z87891 Personal history of nicotine dependence: Secondary | ICD-10-CM | POA: Insufficient documentation

## 2021-07-04 DIAGNOSIS — I739 Peripheral vascular disease, unspecified: Secondary | ICD-10-CM

## 2021-07-04 DIAGNOSIS — M79671 Pain in right foot: Secondary | ICD-10-CM | POA: Insufficient documentation

## 2021-07-04 DIAGNOSIS — S90934D Unspecified superficial injury of right lesser toe(s), subsequent encounter: Secondary | ICD-10-CM | POA: Diagnosis not present

## 2021-07-04 DIAGNOSIS — Z792 Long term (current) use of antibiotics: Secondary | ICD-10-CM | POA: Insufficient documentation

## 2021-07-04 DIAGNOSIS — Z955 Presence of coronary angioplasty implant and graft: Secondary | ICD-10-CM | POA: Diagnosis not present

## 2021-07-04 DIAGNOSIS — E11649 Type 2 diabetes mellitus with hypoglycemia without coma: Secondary | ICD-10-CM | POA: Insufficient documentation

## 2021-07-04 DIAGNOSIS — X58XXXD Exposure to other specified factors, subsequent encounter: Secondary | ICD-10-CM | POA: Insufficient documentation

## 2021-07-04 DIAGNOSIS — M109 Gout, unspecified: Secondary | ICD-10-CM | POA: Insufficient documentation

## 2021-07-04 DIAGNOSIS — F32A Depression, unspecified: Secondary | ICD-10-CM | POA: Diagnosis not present

## 2021-07-04 DIAGNOSIS — R091 Pleurisy: Secondary | ICD-10-CM | POA: Insufficient documentation

## 2021-07-04 DIAGNOSIS — I110 Hypertensive heart disease with heart failure: Secondary | ICD-10-CM | POA: Insufficient documentation

## 2021-07-04 DIAGNOSIS — G479 Sleep disorder, unspecified: Secondary | ICD-10-CM | POA: Insufficient documentation

## 2021-07-04 DIAGNOSIS — I10 Essential (primary) hypertension: Secondary | ICD-10-CM

## 2021-07-04 DIAGNOSIS — G4730 Sleep apnea, unspecified: Secondary | ICD-10-CM | POA: Diagnosis not present

## 2021-07-04 DIAGNOSIS — R000 Tachycardia, unspecified: Secondary | ICD-10-CM | POA: Diagnosis not present

## 2021-07-04 DIAGNOSIS — Z79899 Other long term (current) drug therapy: Secondary | ICD-10-CM | POA: Diagnosis not present

## 2021-07-04 DIAGNOSIS — S90922D Unspecified superficial injury of left foot, subsequent encounter: Secondary | ICD-10-CM | POA: Insufficient documentation

## 2021-07-04 DIAGNOSIS — M79672 Pain in left foot: Secondary | ICD-10-CM | POA: Insufficient documentation

## 2021-07-04 DIAGNOSIS — E785 Hyperlipidemia, unspecified: Secondary | ICD-10-CM | POA: Diagnosis not present

## 2021-07-04 DIAGNOSIS — Z9989 Dependence on other enabling machines and devices: Secondary | ICD-10-CM | POA: Insufficient documentation

## 2021-07-04 DIAGNOSIS — I890 Lymphedema, not elsewhere classified: Secondary | ICD-10-CM | POA: Insufficient documentation

## 2021-07-04 DIAGNOSIS — E1121 Type 2 diabetes mellitus with diabetic nephropathy: Secondary | ICD-10-CM | POA: Diagnosis not present

## 2021-07-04 DIAGNOSIS — J3489 Other specified disorders of nose and nasal sinuses: Secondary | ICD-10-CM | POA: Diagnosis not present

## 2021-07-04 NOTE — Progress Notes (Signed)
Patient ID: Alec Less., male    DOB: [birth year], 66 y.o.   MRN: [id number]   [name] is [name] 66 y/o male with [name] history of atrial fibrillation, asthma, CAD, DM, hyperlipidemia, HTN, depression, gout, hypogonadism, PVD, COPD, pleurisy, sleep apnea, previous tobacco use and chronic heart failure.   Echo report from 05/10/21 reviewed and showed an EF of 50-55% along with moderate LVH  LHC done 02/02/20 and showed: Mid RCA lesion is 90% stenosed. Prox LAD to Mid LAD lesion is 100% stenosed. Mid Cx lesion is 100% stenosed. Origin to Prox Graft lesion is 100% stenosed.  LM normal LAD-100% proxiimal LCx-100% mid RCA-90% mid  Admitted 05/08/21 due to shortness of breath, pedal edema and weeping of fluids.  LLE angiogram showed no significant stenosis. Vascular surgery, wound and cardiology consults obtained. Given IV antibiotics due to cellulitis. Initially given IV lasix with transition to oral diuretics. Bedside debridement of left foot done by podiatry. Discharged after 8 days.  He presents today for [name] follow up visit with [name] chief complaint of moderate shortness of breath with minimal exertion. He describes this as chronic in nature having been present for several years. He has associated fatigue, pedal edema, abdominal edema, tremors, bilateral feet pain and difficulty sleeping along with this. He denies any dizziness, palpitations, chest pain or cough.   Weighing daily, and his weight has steadily decreased over the last week. He is going to the wound clinic Mon, Wed for wound care. He currently has an infection in his heel that he is being treated for with augmentin.   Past Medical History:  Diagnosis Date   Arrhythmia    atrial fibrillation   Asthma    CHF (congestive heart failure) (HCC)    COPD (chronic obstructive pulmonary disease) (HCC)    Coronary artery disease    Depression    Diabetes mellitus without complication (HCC)    Gout    History anabolic steroid use     Hyperlipidemia    Hypertension    Hypogonadism in male    Morbid obesity ([name])    Myocardial infarction ([name])    Peripheral vascular disease ([name])    Perirectal abscess    Pleurisy    Sleep apnea    CPAP at night, no oxygen   Varicella    Past Surgical History:  Procedure Laterality Date   ABDOMINAL AORTIC ANEURYSM REPAIR     ACHILLES TENDON SURGERY Left 01/10/2021   Procedure: ACHILLES LENGTHENING/KIDNER;  Surgeon: [name], DPM;  Location: [facility];  Service: Podiatry;  Laterality: Left;   AMPUTATION TOE Right 02/10/2016   Procedure: AMPUTATION TOE 3RD TOE;  Surgeon: [name], DPM;  Location: [facility];  Service: Podiatry;  Laterality: Right;   AMPUTATION TOE Left 02/24/2020   Procedure: AMPUTATION TOE;  Surgeon: [name], DPM;  Location: [facility];  Service: Podiatry;  Laterality: Left;   APPLICATION OF WOUND VAC Left 02/29/2020   Procedure: APPLICATION OF WOUND VAC;  Surgeon: [name], DPM;  Location: [facility];  Service: Podiatry;  Laterality: Left;   COLONOSCOPY WITH PROPOFOL [name] 11/18/2015   Procedure: COLONOSCOPY WITH PROPOFOL;  Surgeon: Alec Silvas, MD;  Location: [facility];  Service: Endoscopy;  Laterality: [name];   CORONARY ARTERY BYPASS GRAFT     CORONARY STENT INTERVENTION [name] 02/02/2020   Procedure: CORONARY STENT INTERVENTION;  Surgeon: Isaias Cowman, MD;  Location: [place] [facility];  Service: Cardiovascular;  Laterality: [name];   IRRIGATION AND DEBRIDEMENT FOOT Left  02/29/2020   Procedure: IRRIGATION AND DEBRIDEMENT FOOT;  Surgeon: [name], DPM;  Location: [facility];  Service: Podiatry;  Laterality: Left;   IRRIGATION AND DEBRIDEMENT FOOT Left 02/24/2020   Procedure: IRRIGATION AND DEBRIDEMENT FOOT;  Surgeon: [name], DPM;  Location: [facility];  Service: Podiatry;  Laterality: Left;   KNEE ARTHROSCOPY     LEFT HEART CATH AND CORS/GRAFTS ANGIOGRAPHY [name] 02/02/2020   Procedure: LEFT HEART CATH AND CORS/GRAFTS ANGIOGRAPHY;  Surgeon:  Teodoro Spray, MD;  Location: [place] [facility];  Service: Cardiovascular;  Laterality: [name];   LOWER EXTREMITY ANGIOGRAPHY Left 02/25/2020   Procedure: Lower Extremity Angiography;  Surgeon: Algernon Huxley, MD;  Location: [place] [facility];  Service: Cardiovascular;  Laterality: Left;   LOWER EXTREMITY ANGIOGRAPHY Left 01/04/2021   Procedure: LOWER EXTREMITY ANGIOGRAPHY;  Surgeon: Algernon Huxley, MD;  Location: [place] [facility];  Service: Cardiovascular;  Laterality: Left;   METATARSAL HEAD EXCISION Left 01/10/2021   Procedure: METATARSAL HEAD EXCISION - LEFT 5th;  Surgeon: [name], DPM;  Location: [facility];  Service: Podiatry;  Laterality: Left;   PERIPHERAL VASCULAR CATHETERIZATION Right 01/24/2016   Procedure: Lower Extremity Angiography;  Surgeon: Katha Cabal, MD;  Location: [place] [facility];  Service: Cardiovascular;  Laterality: Right;   PERIPHERAL VASCULAR CATHETERIZATION Right 01/25/2016   Procedure: Lower Extremity Angiography;  Surgeon: Katha Cabal, MD;  Location: [place] [facility];  Service: Cardiovascular;  Laterality: Right;   TOE AMPUTATION     TONSILLECTOMY     No family history on file. Social History   Tobacco Use   Smoking status: Former    Packs/day: 0.50    Years: 45.00    Pack years: 22.50    Types: Cigarettes    Quit date: 04/05/2015    Years since quitting: 6.2   Smokeless tobacco: Never  Substance Use Topics   Alcohol use: Not Currently    Alcohol/week: 3.0 standard drinks    Types: 3 Glasses of wine per week   Allergies  Allergen Reactions   Statins     Other reaction(s): Muscle Pain Causes legs to ache per pt   Prior to Admission medications   Medication Sig Start Date End Date Taking? Authorizing Provider  amiodarone (PACERONE) 200 MG tablet Take 1 tablet (200 mg total) by mouth 2 (two) times daily. 05/16/21  Yes Dessa Phi, DO  aspirin EC 81 MG EC tablet Take 1 tablet (81 mg total) by mouth daily. Swallow whole. 05/17/21   Yes Dessa Phi, DO  digoxin 62.5 MCG TABS Take 0.0625 mg by mouth daily. 01/16/21  Yes Bonnielee Haff, MD  diltiazem (CARDIZEM CD) 300 MG 24 hr capsule Take 1 capsule (300 mg total) by mouth daily. 01/16/21  Yes Bonnielee Haff, MD  ezetimibe (ZETIA) 10 MG tablet Take 10 mg by mouth daily. 12/19/20 12/19/21 Yes [provider]  ferrous sulfate 325 (65 FE) MG tablet Take 325 mg by mouth daily with breakfast.   Yes [provider]  furosemide (LASIX) 20 MG tablet Take 3 tablets (60 mg total) by mouth daily. 05/17/21  Yes Dessa Phi, DO  insulin aspart (NOVOLOG) 100 UNIT/ML injection Inject 9 Units into the skin 3 (three) times daily with meals. Patient taking differently: Inject 15 Units into the skin 3 (three) times daily with meals. SSI extra if BG high from 3 to 15 units 01/16/21  Yes Bonnielee Haff, MD  isosorbide mononitrate (IMDUR) 60 MG 24 hr tablet Take 60 mg  by mouth 2 (two) times daily. 04/21/21  Yes [provider]  LANTUS SOLOSTAR 100 UNIT/ML Solostar Pen Inject 50 Units into the skin 2 (two) times daily. Patient taking differently: Inject 40 Units into the skin 2 (two) times daily. 01/16/21  Yes Bonnielee Haff, MD  metFORMIN (GLUCOPHAGE) 1000 MG tablet Take 1,000 mg by mouth 2 (two) times daily.  12/02/17  Yes [provider]  metoprolol tartrate (LOPRESSOR) 50 MG tablet Take 50 mg by mouth 2 (two) times daily. 04/21/21  Yes [provider]  nitroGLYCERIN (NITROSTAT) 0.4 MG SL tablet Place 0.4 mg under the tongue daily as needed. 01/25/20  Yes [provider]  potassium chloride (KLOR-CON) 10 MEQ tablet Take 10 mEq by mouth daily. 03/21/21  Yes [provider]  pramipexole (MIRAPEX) 1 MG tablet Take 2 mg by mouth at bedtime. 11/09/20  Yes [provider]  tamsulosin (FLOMAX) 0.4 MG CAPS capsule Take 0.4 mg by mouth daily.   Yes [provider]  apixaban (ELIQUIS) 5 MG TABS tablet Take 1 tablet (5 mg total)  by mouth 2 (two) times daily. Patient not taking: Reported on 05/19/2021 01/26/16   [name], Alec Millin A, PA-C  gabapentin (NEURONTIN) 300 MG capsule Take 300 mg by mouth at bedtime. Patient not taking: Reported on 05/19/2021 12/02/20   [provider]  Multiple Vitamins-Minerals (MULTIVITAMIN WITH MINERALS) tablet Take 1 tablet by mouth daily. Patient not taking: Reported on 05/19/2021    [provider]  primidone (MYSOLINE) 250 MG tablet Take 250 mg by mouth 2 (two) times daily. Patient not taking: Reported on 05/19/2021 01/23/20   [provider]  vitamin B-12 (CYANOCOBALAMIN) 1000 MCG tablet Take 10,000 mcg by mouth daily. Energy shot Patient not taking: Reported on 05/19/2021    [provider]  zinc gluconate 50 MG tablet Take 50 mg by mouth daily. Patient not taking: Reported on 05/19/2021    [provider]   Review of Systems  Constitutional:  Positive for fatigue (tire easily). Negative for appetite change.  HENT:  Positive for rhinorrhea. Negative for congestion and postnasal drip.   Eyes: Negative.   Respiratory:  Positive for shortness of breath. Negative for cough.   Cardiovascular:  Positive for leg swelling. Negative for chest pain and palpitations.  Gastrointestinal:  Positive for abdominal distention. Negative for abdominal pain.  Endocrine: Negative.   Genitourinary: Negative.   Musculoskeletal:  Positive for arthralgias (left heel). Negative for back pain.  Skin:  Positive for wound (left heel/ right toe).  Allergic/Immunologic: Negative.   Neurological:  Positive for tremors. Negative for dizziness and light-headedness.  Hematological:  Negative for adenopathy. Bruises/bleeds easily.  Psychiatric/Behavioral:  Positive for sleep disturbance (due to feet pain/ restless leg). Negative for dysphoric mood. The patient is not nervous/anxious.    Vitals:   07/04/21 1359  BP: 106/66  Pulse: (!) 106  Resp: 18  SpO2: 97%  Weight: (!)  368 lb 4 oz (167 kg)  Height: '6\' 4"'$  (1.93 m)   Wt Readings from Last 3 Encounters:  07/04/21 (!) 368 lb 4 oz (167 kg)  06/20/21 (!) 383 lb 5 oz (173.9 kg)  05/19/21 (!) 379 lb 2 oz (172 kg)   Lab Results  Component Value Date   CREATININE 1.14 06/27/2021   CREATININE 1.24 06/23/2021   CREATININE 1.32 (H) 06/20/2021    Physical Exam Vitals and nursing note reviewed. Exam conducted with [name] chaperone present (wife).  Constitutional:      Appearance: Normal appearance.  He is obese. He is ill-appearing.  HENT:     Head: Normocephalic and atraumatic.     Nose: Rhinorrhea present.  Cardiovascular:     Rate and Rhythm: Normal rate and regular rhythm.  Pulmonary:     Effort: No respiratory distress.     Breath sounds: No wheezing or rales.  Abdominal:     General: There is distension.     Palpations: Abdomen is soft.  Musculoskeletal:     Cervical back: Normal range of motion.     Right lower leg: Edema (soft at knee; wrapped in [place] boot) present.     Left lower leg: Edema (4+ pitting at knee; wrapped in UNNA boot) present.  Skin:    General: Skin is warm and dry.  Neurological:     General: No focal deficit present.     Mental Status: He is alert and oriented to person, place, and time.  Psychiatric:        Mood and Affect: Mood normal.        Behavior: Behavior normal.        Thought Content: Thought content normal.    Assessment & Plan:  1: Chronic heart failure with preserved ejection fraction with structural changes (LVH)- - NYHA class III - euvolemic today  - weighing daily, weight today 268 down 15 lbs from last visit on 06/20/21 - now not adding salt and has been using NoSalt; admits that he used to use [name] lot of salt on his food; explained the importance of keeping daily sodium intake to ~ '2000mg'$ / day - drinking much less fluids/day, admits not < 64 ounces but has tremendously decreased his liquid intake - BP too low with associated fatigue to add GDMT - would like  to take metolazone on [name] routine basis; advised him to f/u with PCP as we cannot see his current labwork that was collected yesterday - may not be [name] good candidate for SGLT2 due to body habitus and frequent fungal infections around abdominal folds - does wear his CPAP nightly - BNP 05/09/21 was 531.5 - will see cardiology [name] 08/09/21)  2: HTN- - BP 106/66, re-checked 104/66 - saw PCP [name]) 07/03/21; returns in June - BMP collected 07/03/21 by PCP and still pending  3: DM- - saw endocrinology [name]) 04/28/21; returns 09/29/21 - A1c 05/09/21 was 9.2% - BS today 158, reports one episode of hypoglycemia (54)  4: Atrial fibrillation- - will see [name] on 08/09/21 - regular rhythm noted, although tachycardiac   5: PAD/ lymphedema- - currently both legs are wrapped in UNNA boots to the knees and getting changed by home health nurse - says that the swelling is some better in the mornings after his legs have been elevated   Medication list reviewed.   Return in 2 months, sooner if needed.

## 2021-07-07 ENCOUNTER — Other Ambulatory Visit: Payer: Self-pay

## 2021-07-07 ENCOUNTER — Encounter: Payer: Medicare Other | Admitting: Internal Medicine

## 2021-07-07 DIAGNOSIS — Z89421 Acquired absence of other right toe(s): Secondary | ICD-10-CM | POA: Diagnosis not present

## 2021-07-07 DIAGNOSIS — L97818 Non-pressure chronic ulcer of other part of right lower leg with other specified severity: Secondary | ICD-10-CM | POA: Diagnosis not present

## 2021-07-07 DIAGNOSIS — E11622 Type 2 diabetes mellitus with other skin ulcer: Secondary | ICD-10-CM | POA: Diagnosis not present

## 2021-07-07 DIAGNOSIS — L97522 Non-pressure chronic ulcer of other part of left foot with fat layer exposed: Secondary | ICD-10-CM | POA: Diagnosis not present

## 2021-07-07 DIAGNOSIS — L89893 Pressure ulcer of other site, stage 3: Secondary | ICD-10-CM | POA: Diagnosis not present

## 2021-07-07 DIAGNOSIS — I70235 Atherosclerosis of native arteries of right leg with ulceration of other part of foot: Secondary | ICD-10-CM | POA: Diagnosis not present

## 2021-07-07 DIAGNOSIS — I872 Venous insufficiency (chronic) (peripheral): Secondary | ICD-10-CM | POA: Diagnosis not present

## 2021-07-07 DIAGNOSIS — Z794 Long term (current) use of insulin: Secondary | ICD-10-CM | POA: Diagnosis not present

## 2021-07-07 DIAGNOSIS — L89623 Pressure ulcer of left heel, stage 3: Secondary | ICD-10-CM | POA: Diagnosis not present

## 2021-07-07 DIAGNOSIS — E11621 Type 2 diabetes mellitus with foot ulcer: Secondary | ICD-10-CM | POA: Diagnosis not present

## 2021-07-07 DIAGNOSIS — I110 Hypertensive heart disease with heart failure: Secondary | ICD-10-CM | POA: Diagnosis not present

## 2021-07-07 DIAGNOSIS — I5042 Chronic combined systolic (congestive) and diastolic (congestive) heart failure: Secondary | ICD-10-CM | POA: Diagnosis not present

## 2021-07-07 DIAGNOSIS — I70245 Atherosclerosis of native arteries of left leg with ulceration of other part of foot: Secondary | ICD-10-CM | POA: Diagnosis not present

## 2021-07-07 DIAGNOSIS — I2510 Atherosclerotic heart disease of native coronary artery without angina pectoris: Secondary | ICD-10-CM | POA: Diagnosis not present

## 2021-07-07 NOTE — Progress Notes (Signed)
ERRON, Wood ([id number]) Visit Report for 07/07/2021 Debridement Details Patient Name: [name] A. Date of Service: 07/07/2021 8:30 AM Medical Record Number: [id number] Patient Account Number: [id number] Date of Birth/Sex: 08-14-1955 (66 y.o. M) Treating RN: [name] Primary Care Provider: Fulton Wood Other Clinician: Referring Provider: Fulton Wood Treating Provider/Extender: [name] in Treatment: 6 Debridement Performed for Wound #7 Left Calcaneus Assessment: Performed By: Physician Alec Dillon, MD Debridement Type: Debridement Level of Consciousness (Pre- Awake and Alert procedure): Pre-procedure Verification/Time Out Yes - 09:23 Taken: Total Area Debrided (L x W): 3.6 (cm) x 1.5 (cm) = 5.4 (cm) Tissue and other material Viable, Non-Viable, Slough, Subcutaneous, Slough debrided: Level: Skin/Subcutaneous Tissue Debridement Description: Excisional Instrument: Curette Bleeding: Moderate Hemostasis Achieved: Silver Nitrate Response to Treatment: Procedure was tolerated well Level of Consciousness (Post- Awake and Alert procedure): Post Debridement Measurements of Total Wound Length: (cm) 3.6 Stage: Category/Stage III Width: (cm) 1.5 Depth: (cm) 1.2 Volume: (cm) 5.089 Character of Wound/Ulcer Post Debridement: Stable Post Procedure Diagnosis Same as Pre-procedure Electronic Signature(s) Signed: 07/07/2021 12:57:19 PM By: [name], BSN, RN, CWS, Kim RN, BSN Signed: 07/07/2021 1:04:01 PM By: Alec Ham MD Entered By: [name] on 07/07/2021 09:51:43 [name], [name] ([id number]) -------------------------------------------------------------------------------- HPI Details Patient Name: [name], [name] Date of Service: 07/07/2021 8:30 AM Medical Record Number: [id number] Patient Account Number: [id number] Date of Birth/Sex: July 08, 1955 (66 y.o. M) Treating RN: [name] Primary Care Provider: Fulton Wood  Other Clinician: Referring Provider: Fulton Wood Treating Provider/Extender: [name] in Treatment: 6 History of Present Illness HPI Description: Admission 8/31 [name] is a 66 year old male with a past medical history of uncontrolled insulin-dependent type 2 diabetes, CAD, osteomyelitis to the left third toe status post partial third ray amputation, and peripheral arterial disease that presents for a left plantar foot wound and bilateral lower extremity wounds. The left plantar foot wound has been present for at least 7 months. He has been following podiatry for this issue. He uses silver collagen to this area. He reports that in the past 1 to 2 months he has developed multiple scattered open wounds to his lower extremities bilaterally that have worsened over time. He states that they often develop as either blisters or he hits his leg against an object.He is currently using Betadine to these areas. He denies signs of infection. He has compression stockings however he cannot put these on very easily. Also he is on his feet most of the day. He states he does not have anyone to help him do the activities he needs to get done. Readmission: 05/26/2021 upon evaluation today patient presents for reevaluation here in the clinic concerning issues he is been having with his bilateral lower extremities, his right great toe, and left heel primarily. With that being said he has been in the hospital recently and his hemoglobin A1c on January 10 was 9.2. With that being said he does have a lot of really thickened dry skin attached to his legs when he try to remove that today. He is in agreement with the plan. Nonetheless I do believe that based on what we are seeing he is been having a lot of issues with his congestive heart failure I do have him on fluid pills he does go to the bathroom quite a bit. Subsequently has been in [employer] but I do not think that is doing quite  enough to help control his edema. He also needs something better than Santyl  to put on the heel right now when he was in the hospital they actually cut a space open in the compression around his heel so they can open it up and actually apply the Santyl daily. I think he needs something better than that. Again the patient does have a history of diabetes mellitus type 2, chronic venous insufficiency, hypertension, and congestive heart failure. He also has signs of stage III lymphedema as well. 06/02/2021 upon evaluation today patient appears to be doing decently well in regard to his wounds. Fortunately I do not see any signs of active infection at this time which is great news. Fortunately I think that he is making excellent progress. Nonetheless I do think that we seem to be making some progress here. 06/09/2021 upon evaluation today patient appears to be doing well with regard to his legs all things considered unfortunately the left heel is not doing nearly as well as what I like to see. There is a lot of necrotic tissue this is very boggy. I think that it is going to require potentially much more debridement but again we really need to get the results of the arterial testing done and this is actually scheduled for the 15th. 2/17; arrives in clinic today with the left plantar heel wound completely necrotic and malodorous. Comparing the picture with last week this looks quite a bit worse. He has been using Iodoflex under compression. He has an area on the right first toe which seems clean and better and a new area on the right anterior mid Tibia. I was unable to find the results of his arterial studies that he had done on 1/15. Presumably they have not been read. I can see that they have been done 06/23/2021 upon evaluation today patient appears to be doing well currently in regard to his legs. I am actually very pleased in that regard. Fortunately I do not see any signs of active infection at this  time. I have reviewed his arterial study and it appeared to be pretty good with TBI's on the left of 0.88 on the right 0.87. Overall I think that we are definitely headed in the right direction which is great news. Fortunately I do not see any signs of active infection locally or systemically at this time. 06/30/2021 upon evaluation today patient appears to be doing well with regard to the right lower extremity ulcers with very pleased in this regard. I do feel like the Iodoflex has done well here. In regard to the left heel location this is something that I am actually a little bit more concerned about I really think that switching over to the Dakin's moistened gauze dressing may be the best way to go at this point based on what we are seeing. We have been using Iodoflex is just not clearing up quite as well as I would like to see and I think this may be a better method at which to get things under better control. The good news is he does have his Velcro compression wrap so we can try that at this point as well. 3/10; patient has chronic venous insufficiency with a wound on the right anterior mid tibia, dorsal right first toe a large plantar left heel wound. We are using Dakin's on the left heel and Iodosorb on the right anterior lower extremity and the right first toe. The areas on the right are apparently a lot better. The left heel is certainly a lot better than I remember seeing this  the last time 3 weeks ago. He is using a scooter to offload the left heel Electronic Signature(s) Signed: 07/07/2021 1:04:01 PM By: Alec Ham MD Entered By: [name] on 07/07/2021 09:48:28 [name], [name] ([id number]) -------------------------------------------------------------------------------- Physical Exam Details Patient Name: Notarianni, Refoel A. Date of Service: 07/07/2021 8:30 AM Medical Record Number: [id number] Patient Account Number: [id number] Date of Birth/Sex: 07-05-1955 (66 y.o.  M) Treating RN: [name] Primary Care Provider: Fulton Wood Other Clinician: Referring Provider: Fulton Wood Treating Provider/Extender: [name] in Treatment: 6 Constitutional Sitting or standing Blood Pressure is within target range for patient.. Pulse regular and within target range for patient.Marland Kitchen Respirations regular, non- labored and within target range.. Temperature is normal and within the target range for the patient.Marland Kitchen appears in no distress. Notes Wound exam; his right wounds look considerably better than I remember seeing them. Very superficial right anterior lower tibia and the right great toe over the interphalangeal joint. These are just about healed. The major areas on the left plantar heel. This looks better than last time I saw it. I still used a #5 curette for debridement of very adherent surface debris and subcutaneous tissue. Hemostasis with silver nitrate and direct pressure/pressure dressing Electronic Signature(s) Signed: 07/07/2021 1:04:01 PM By: Alec Ham MD Entered By: [name] on 07/07/2021 09:49:47 [name], [name] ([id number]) -------------------------------------------------------------------------------- Physician Orders Details Patient Name: [name], [name] Date of Service: 07/07/2021 8:30 AM Medical Record Number: [id number] Patient Account Number: [id number] Date of Birth/Sex: 1955/08/24 (66 y.o. M) Treating RN: [name] Primary Care Provider: Fulton Wood Other Clinician: Referring Provider: Fulton Wood Treating Provider/Extender: [name] in Treatment: 6 Verbal / Phone Orders: No Diagnosis Coding Follow-up Appointments o Return Appointment in 2 weeks. - at request o Nurse Visit as needed [facility] for wound care. May utilize formulary equivalent dressing for wound treatment orders unless otherwise specified. Home  Health Nurse may visit PRN to address patientos wound care needs. o Scheduled days for dressing changes to be completed; exception, patient has scheduled wound care visit that day. - HH to wrap twice weekly and at wound center one time unless he is unable to have appt at wound center that week then 3 x o **Please direct any NON-WOUND related issues/requests for orders to patient's Primary Care Physician. **If current dressing causes regression in wound condition, may D/C ordered dressing product/s and apply Normal Saline Moist Dressing daily until next [facility] or Other MD appointment. **Notify Wound Healing Center of regression in wound condition at [phone]. Bathing/ Shower/ Hygiene o May shower with wound dressing protected with water repellent cover or cast protector. o No tub bath. Anesthetic (Use 'Patient Medications' Section for Anesthetic Order Entry) o Lidocaine applied to wound bed Edema Control - Lymphedema / Segmental Compressive Device / Other o Optional: One layer of unna paste to top of compression wrap (to act as an anchor). o Tubigrip single layer applied. - left leg in place of the sock that comes with Juxtelite-size F o Elevate legs to the level of the heart and pump ankles as often as possible o Elevate leg(s) parallel to the floor when sitting. o DO YOUR BEST to sleep in the bed at night. DO NOT sleep in your recliner. Long hours of sitting in a recliner leads to swelling of the legs and/or potential wounds on your backside. Non-Wound Condition o Additional non-wound orders/instructions: - Triamcinolone mixed with lotion  to bilateral lower legs gauze between toes to keep dry; bandaid to cover newly healed skin to toe Off-Loading o Open toe surgical shoe with peg assist. - L heel wedge shoe Recommend knee scooter o Turn and reposition every 2 hours - keep pressure off of left heel Additional Orders / Instructions o Follow  Nutritious Diet and Increase Protein Intake - monitor blood sugar o Other: - Recommend urea cream for legs Medications-Please add to medication list. o P.O. Antibiotics - complete extended amount of antibiotic and take as directed for the heel wound Wound Treatment Wound #11 - Lower Leg Wound Laterality: Right Cleanser: Normal Saline 3 x Per Week/15 Days Discharge Instructions: Wash your hands with soap and water. Remove old dressing, discard into plastic bag and place into trash. Cleanse the wound with Normal Saline prior to applying a clean dressing using gauze sponges, not tissues or cotton balls. Do not scrub or use excessive force. Pat dry using gauze sponges, not tissue or cotton balls. Cleanser: Soap and Water 3 x Per Week/15 Days Discharge Instructions: Gently cleanse wound with antibacterial soap, rinse and pat dry prior to dressing wounds Cleanser: Wound Cleanser 3 x Per Week/15 Days [name], [name] ([id number]) Discharge Instructions: Wash your hands with soap and water. Remove old dressing, discard into plastic bag and place into trash. Cleanse the wound with Wound Cleanser prior to applying a clean dressing using gauze sponges, not tissues or cotton balls. Do not scrub or use excessive force. Pat dry using gauze sponges, not tissue or cotton balls. Topical: Triamcinolone Acetonide Cream, 0.1%, 15 (g) tube 3 x Per Week/15 Days Discharge Instructions: Mixed with half Eucerin or alternative-to bilateral lower legsApply as directed by provider. Topical: Eucerin lotion 3 x Per Week/15 Days Discharge Instructions: Mixed 1/2 with TCA to bilateral lower legs Primary Dressing: Iodosorb 40 (g) 3 x Per Week/15 Days Discharge Instructions: Apply IodoSorb to wound bed only as directed. Secondary Dressing: (NON-BORDER) Zetuvit Plus Silicone NON-BORDER 5x5 (in/in) 3 x Per Week/15 Days Compression Wrap: UNNA PASTE ANCHOR - Unna Boot 4x10 (in/yd) 3 x Per Week/15 Days Discharge  Instructions: Unna Paste, Kerlix and Coban from base of toes to three finger widths below bend in knee. Compression Wrap: 3-LAYER WRAP - Profore Lite LF 3 Multilayer Compression Bandaging System 3 x Per Week/15 Days Discharge Instructions: Apply 3 multi-layer wrap as prescribed. Compression Stockings: Circaid Juxta Lite Compression Wrap Right Leg Compression Amount: 30-40 mmHG Discharge Instructions: Apply Circaid Juxta Lite Compression Wrap as directed Wound #7 - Calcaneus Wound Laterality: Left Cleanser: Dakin 16 (oz) 0.25 1 x Per Day/15 Days Discharge Instructions: Use as directed. Cleanser: Normal Saline 1 x Per WER/15 Days Discharge Instructions: Wash your hands with soap and water. Remove old dressing, discard into plastic bag and place into trash. Cleanse the wound with Normal Saline prior to applying a clean dressing using gauze sponges, not tissues or cotton balls. Do not scrub or use excessive force. Pat dry using gauze sponges, not tissue or cotton balls. Cleanser: Soap and Water 1 x Per Day/15 Days Discharge Instructions: Gently cleanse wound with antibacterial soap, rinse and pat dry prior to dressing wounds Cleanser: Wound Cleanser 1 x Per Day/15 Days Discharge Instructions: Wash your hands with soap and water. Remove old dressing, discard into plastic bag and place into trash. Cleanse the wound with Wound Cleanser prior to applying a clean dressing using gauze sponges, not tissues or cotton balls. Do not scrub or use excessive force. Pat dry using gauze  sponges, not tissue or cotton balls. Topical: Triamcinolone Acetonide Cream, 0.1%, 15 (g) tube 1 x Per Day/15 Days Discharge Instructions: Mixed with half Eucerin or alternative-to bilateral lower legsApply as directed by provider. Topical: Eucerin lotion 1 x Per Day/15 Days Discharge Instructions: Mixed 1/2 with TCA to bilateral lower legs Primary Dressing: Gauze 1 x Per Day/15 Days Discharge Instructions: Dakins lightly soaked  gauze Secondary Dressing: (NON-BORDER) Zetuvit Plus Silicone NON-BORDER 5x5 (in/in) 1 x Per Day/15 Days Secured With: Medipore Tape - 75M Medipore H Soft Cloth Surgical Tape, 2x2 (in/yd) 1 x Per Day/15 Days Secured With: Kerlix Roll Sterile or Non-Sterile 6-ply 4.5x4 (yd/yd) 1 x Per Day/15 Days Discharge Instructions: Apply Kerlix as directed Compression Wrap: Left leg JUXTELITE WITH TUBI GRIP UNDER 1 x Per Day/15 Days Wound #9 - Toe Great Wound Laterality: Right Cleanser: Normal Saline 3 x Per Week/15 Days Discharge Instructions: Wash your hands with soap and water. Remove old dressing, discard into plastic bag and place into trash. Cleanse the wound with Normal Saline prior to applying a clean dressing using gauze sponges, not tissues or cotton balls. Do not scrub or use excessive force. Pat dry using gauze sponges, not tissue or cotton balls. Cleanser: Soap and Water 3 x Per Week/15 Days Discharge Instructions: Gently cleanse wound with antibacterial soap, rinse and pat dry prior to dressing wounds [name], [name] ([id number]) Cleanser: Wound Cleanser 3 x Per Week/15 Days Discharge Instructions: Wash your hands with soap and water. Remove old dressing, discard into plastic bag and place into trash. Cleanse the wound with Wound Cleanser prior to applying a clean dressing using gauze sponges, not tissues or cotton balls. Do not scrub or use excessive force. Pat dry using gauze sponges, not tissue or cotton balls. Topical: Triamcinolone Acetonide Cream, 0.1%, 15 (g) tube 3 x Per Week/15 Days Discharge Instructions: Mixed with half Eucerin or alternative-to bilateral lower legsApply as directed by provider. Topical: Eucerin lotion 3 x Per Week/15 Days Discharge Instructions: Mixed 1/2 with TCA to bilateral lower legs Primary Dressing: Iodosorb 40 (g) 3 x Per Week/15 Days Discharge Instructions: Apply IodoSorb to wound bed only as directed. Secondary Dressing: (NON-BORDER) Zetuvit Plus  Silicone NON-BORDER 5x5 (in/in) 3 x Per Week/15 Days Compression Wrap: UNNA PASTE ANCHOR - Unna Boot 4x10 (in/yd) 3 x Per Week/15 Days Discharge Instructions: Unna Paste, Kerlix and Coban from base of toes to three finger widths below bend in knee. Compression Wrap: 3-LAYER WRAP - Profore Lite LF 3 Multilayer Compression Bandaging System 3 x Per Week/15 Days Discharge Instructions: Apply 3 multi-layer wrap as prescribed. Electronic Signature(s) Signed: 07/07/2021 12:57:19 PM By: [name], BSN, RN, CWS, Kim RN, BSN Signed: 07/07/2021 1:04:01 PM By: Alec Ham MD Entered By: [name], BSN, RN, CWS, [name] on 07/07/2021 12:30:51 [name], [name] ([id number]) -------------------------------------------------------------------------------- Problem List Details Patient Name: ELDAR, ROBITAILLE A. Date of Service: 07/07/2021 8:30 AM Medical Record Number: [id number] Patient Account Number: [id number] Date of Birth/Sex: Aug 21, 1955 (66 y.o. M) Treating RN: [name] Primary Care Provider: Fulton Wood Other Clinician: Referring Provider: Fulton Wood Treating Provider/Extender: [name] in Treatment: 6 Active Problems ICD-10 Encounter Code Description Active Date MDM Diagnosis [phone] Pressure ulcer of left heel, stage 3 05/26/2021 No Yes E11.621 Type 2 diabetes mellitus with foot ulcer 05/26/2021 No Yes L97.522 Non-pressure chronic ulcer of other part of left foot with fat layer 05/26/2021 No Yes exposed L89.893 Pressure ulcer of other site, stage 3 05/26/2021 No Yes I87.2 Venous insufficiency (chronic) (peripheral) 05/26/2021 No  Yes I50.42 Chronic combined systolic (congestive) and diastolic (congestive) heart 05/26/2021 No Yes failure I10 Essential (primary) hypertension 05/26/2021 No Yes L97.818 Non-pressure chronic ulcer of other part of right lower leg with other 06/16/2021 No Yes specified severity Inactive Problems Resolved Problems Electronic Signature(s) Signed:  07/07/2021 1:04:01 PM By: Alec Ham MD Entered By: [name] on 07/07/2021 09:46:29 [name], [name] ([id number]) -------------------------------------------------------------------------------- Progress Note Details Patient Name: [name], [name] Date of Service: 07/07/2021 8:30 AM Medical Record Number: [id number] Patient Account Number: [id number] Date of Birth/Sex: [birth year] (66 y.o. M) Treating RN: [name] Primary Care Provider: Fulton Wood Other Clinician: Referring Provider: Fulton Wood Treating Provider/Extender: [name] in Treatment: 6 Subjective History of Present Illness (HPI) Admission 8/31 Mr. carnell beavers is a 66 year old male with a past medical history of uncontrolled insulin-dependent type 2 diabetes, CAD, osteomyelitis to the left third toe status post partial third ray amputation, and peripheral arterial disease that presents for a left plantar foot wound and bilateral lower extremity wounds. The left plantar foot wound has been present for at least 7 months. He has been following podiatry for this issue. He uses silver collagen to this area. He reports that in the past 1 to 2 months he has developed multiple scattered open wounds to his lower extremities bilaterally that have worsened over time. He states that they often develop as either blisters or he hits his leg against an object.He is currently using Betadine to these areas. He denies signs of infection. He has compression stockings however he cannot put these on very easily. Also he is on his feet most of the day. He states he does not have anyone to help him do the activities he needs to get done. Readmission: 05/26/2021 upon evaluation today patient presents for reevaluation here in the clinic concerning issues he is been having with his bilateral lower extremities, his right great toe, and left heel primarily. With that being said he has been in the hospital recently  and his hemoglobin A1c on January 10 was 9.2. With that being said he does have a lot of really thickened dry skin attached to his legs when he try to remove that today. He is in agreement with the plan. Nonetheless I do believe that based on what we are seeing he is been having a lot of issues with his congestive heart failure I do have him on fluid pills he does go to the bathroom quite a bit. Subsequently has been in [employer] but I do not think that is doing quite enough to help control his edema. He also needs something better than Santyl to put on the heel right now when he was in the hospital they actually cut a space open in the compression around his heel so they can open it up and actually apply the Santyl daily. I think he needs something better than that. Again the patient does have a history of diabetes mellitus type 2, chronic venous insufficiency, hypertension, and congestive heart failure. He also has signs of stage III lymphedema as well. 06/02/2021 upon evaluation today patient appears to be doing decently well in regard to his wounds. Fortunately I do not see any signs of active infection at this time which is great news. Fortunately I think that he is making excellent progress. Nonetheless I do think that we seem to be making some progress here. 06/09/2021 upon evaluation today patient appears to be doing well with regard to his legs all things  considered unfortunately the left heel is not doing nearly as well as what I like to see. There is a lot of necrotic tissue this is very boggy. I think that it is going to require potentially much more debridement but again we really need to get the results of the arterial testing done and this is actually scheduled for the 15th. 2/17; arrives in clinic today with the left plantar heel wound completely necrotic and malodorous. Comparing the picture with last week this looks quite a bit worse. He has been using Iodoflex under compression.  He has an area on the right first toe which seems clean and better and a new area on the right anterior mid Tibia. I was unable to find the results of his arterial studies that he had done on 1/15. Presumably they have not been read. I can see that they have been done 06/23/2021 upon evaluation today patient appears to be doing well currently in regard to his legs. I am actually very pleased in that regard. Fortunately I do not see any signs of active infection at this time. I have reviewed his arterial study and it appeared to be pretty good with TBI's on the left of 0.88 on the right 0.87. Overall I think that we are definitely headed in the right direction which is great news. Fortunately I do not see any signs of active infection locally or systemically at this time. 06/30/2021 upon evaluation today patient appears to be doing well with regard to the right lower extremity ulcers with very pleased in this regard. I do feel like the Iodoflex has done well here. In regard to the left heel location this is something that I am actually a little bit more concerned about I really think that switching over to the Dakin's moistened gauze dressing may be the best way to go at this point based on what we are seeing. We have been using Iodoflex is just not clearing up quite as well as I would like to see and I think this may be a better method at which to get things under better control. The good news is he does have his Velcro compression wrap so we can try that at this point as well. 3/10; patient has chronic venous insufficiency with a wound on the right anterior mid tibia, dorsal right first toe a large plantar left heel wound. We are using Dakin's on the left heel and Iodosorb on the right anterior lower extremity and the right first toe. The areas on the right are apparently a lot better. The left heel is certainly a lot better than I remember seeing this the last time 3 weeks ago. He is using a scooter  to offload the left heel Objective Constitutional [name], [name] ([id number]) Sitting or standing Blood Pressure is within target range for patient.. Pulse regular and within target range for patient.Marland Kitchen Respirations regular, non- labored and within target range.. Temperature is normal and within the target range for the patient.Marland Kitchen appears in no distress. Vitals Time Taken: 8:43 AM, Height: 73 in, Weight: 371 lbs, BMI: 48.9, Temperature: 98.2 F, Pulse: 108 bpm, Respiratory Rate: 18 breaths/min, Blood Pressure: 136/81 mmHg. General Notes: Wound exam; his right wounds look considerably better than I remember seeing them. Very superficial right anterior lower tibia and the right great toe over the interphalangeal joint. These are just about healed. The major areas on the left plantar heel. This looks better than last time I saw it. I still used  a #5 curette for debridement of very adherent surface debris and subcutaneous tissue. Hemostasis with silver nitrate and direct pressure/pressure dressing Integumentary (Hair, Skin) Wound #11 status is Open. Original cause of wound was Gradually Appeared. The date acquired was: 06/16/2021. The wound has been in treatment 3 weeks. The wound is located on the Right Lower Leg. The wound measures 0.5cm length x 0.5cm width x 0.2cm depth; 0.196cm^2 area and 0.039cm^3 volume. There is Fat Layer (Subcutaneous Tissue) exposed. There is no tunneling or undermining noted. There is a medium amount of serosanguineous drainage noted. There is large (67-100%) red, pale granulation within the wound bed. There is no necrotic tissue within the wound bed. Wound #7 status is Open. Original cause of wound was Gradually Appeared. The date acquired was: 05/05/2021. The wound has been in treatment 6 weeks. The wound is located on the Left Calcaneus. The wound measures 3.6cm length x 1.5cm width x 1.2cm depth; 4.241cm^2 area and 5.089cm^3 volume. There is Fat Layer (Subcutaneous  Tissue) exposed. There is a large amount of serosanguineous drainage noted. The wound margin is thickened. There is small (1-33%) pink granulation within the wound bed. There is a large (67-100%) amount of necrotic tissue within the wound bed including Eschar and Adherent Slough. Wound #9 status is Open. Original cause of wound was Gradually Appeared. The date acquired was: 04/26/2021. The wound has been in treatment 6 weeks. The wound is located on the Right Toe Great. The wound measures 0.9cm length x 0.7cm width x 0.1cm depth; 0.495cm^2 area and 0.049cm^3 volume. There is a medium amount of serosanguineous drainage noted. Assessment Active Problems ICD-10 Pressure ulcer of left heel, stage 3 Type 2 diabetes mellitus with foot ulcer Non-pressure chronic ulcer of other part of left foot with fat layer exposed Pressure ulcer of other site, stage 3 Venous insufficiency (chronic) (peripheral) Chronic combined systolic (congestive) and diastolic (congestive) heart failure Essential (primary) hypertension Non-pressure chronic ulcer of other part of right lower leg with other specified severity Procedures Wound #7 Pre-procedure diagnosis of Wound #7 is a Pressure Ulcer located on the Left Calcaneus . There was a Excisional Skin/Subcutaneous Tissue Debridement with a total area of 5.4 sq cm performed by Alec Dillon, MD. With the following instrument(s): Curette to remove Viable and Non-Viable tissue/material. Material removed includes Subcutaneous Tissue and Slough and. No specimens were taken. A time out was conducted at 09:23, prior to the start of the procedure. A Moderate amount of bleeding was controlled with Silver Nitrate. The procedure was tolerated well. Post Debridement Measurements: 3.6cm length x 1.5cm width x 1.2cm depth; 5.089cm^3 volume. Post debridement Stage noted as Category/Stage III. Character of Wound/Ulcer Post Debridement is stable. Post procedure Diagnosis Wound #7:  Same as Pre-Procedure Plan Follow-up Appointments: Return Appointment in 2 weeks. - at request Nurse Visit as needed - [facility] will change week of 3/6 Home Health: PAVEL, GADD ([id number]) [place]: - [facility] for wound care. May utilize formulary equivalent dressing for wound treatment orders unless otherwise specified. Home Health Nurse may visit [facility] to address patient s wound care needs. Scheduled days for dressing changes to be completed; exception, patient has scheduled wound care visit that day. - **No wound center appt week of 3/6 HH to wrap twice weekly and at wound center one time unless he is unable to have appt at wound center that week then 3 x **Please direct any NON-WOUND related issues/requests for orders to patient's Primary Care Physician. **If current  dressing causes regression in wound condition, may D/C ordered dressing product/s and apply Normal Saline Moist Dressing daily until next [place] or Other MD appointment. **Notify Wound Healing Center of regression in wound condition at [phone]. Bathing/ Shower/ Hygiene: May shower with wound dressing protected with water repellent cover or cast protector. No tub bath. Anesthetic (Use 'Patient Medications' Section for Anesthetic Order Entry): Lidocaine applied to wound bed Edema Control - Lymphedema / Segmental Compressive Device / Other: Optional: One layer of unna paste to top of compression wrap (to act as an anchor). Tubigrip single layer applied. - left leg in place of the sock that comes with Juxtelite-size F Elevate legs to the level of the heart and pump ankles as often as possible Elevate leg(s) parallel to the floor when sitting. DO YOUR BEST to sleep in the bed at night. DO NOT sleep in your recliner. Long hours of sitting in a recliner leads to swelling of the legs and/or potential wounds on your backside. Non-Wound Condition: Additional non-wound  orders/instructions: - Triamcinolone mixed with lotion to bilateral lower legs gauze between toes to keep dry; bandaid to cover newly healed skin to toe Off-Loading: Open toe surgical shoe with peg assist. - L heel wedge shoe Recommend knee scooter Turn and reposition every 2 hours - keep pressure off of left heel Additional Orders / Instructions: Follow Nutritious Diet and Increase Protein Intake - monitor blood sugar Other: - Recommend urea cream for legs Medications-Please add to medication list.: P.O. Antibiotics - Pick up extended amount of antibiotic and take as directed for the heel wound WOUND #11: - Lower Leg Wound Laterality: Right Cleanser: Normal Saline 3 x Per Week/15 Days Discharge Instructions: Wash your hands with soap and water. Remove old dressing, discard into plastic bag and place into trash. Cleanse the wound with Normal Saline prior to applying a clean dressing using gauze sponges, not tissues or cotton balls. Do not scrub or use excessive force. Pat dry using gauze sponges, not tissue or cotton balls. Cleanser: Soap and Water 3 x Per Week/15 Days Discharge Instructions: Gently cleanse wound with antibacterial soap, rinse and pat dry prior to dressing wounds Cleanser: Wound Cleanser 3 x Per Week/15 Days Discharge Instructions: Wash your hands with soap and water. Remove old dressing, discard into plastic bag and place into trash. Cleanse the wound with Wound Cleanser prior to applying a clean dressing using gauze sponges, not tissues or cotton balls. Do not scrub or use excessive force. Pat dry using gauze sponges, not tissue or cotton balls. Topical: Triamcinolone Acetonide Cream, 0.1%, 15 (g) tube 3 x Per Week/15 Days Discharge Instructions: Mixed with half Eucerin or alternative-to bilateral lower legsApply as directed by provider. Topical: Eucerin lotion 3 x Per Week/15 Days Discharge Instructions: Mixed 1/2 with TCA to bilateral lower legs Primary Dressing:  Iodosorb 40 (g) 3 x Per Week/15 Days Discharge Instructions: Apply IodoSorb to wound bed only as directed. Secondary Dressing: (NON-BORDER) Zetuvit Plus Silicone NON-BORDER 5x5 (in/in) 3 x Per Week/15 Days Compression Wrap: 3-LAYER WRAP - Profore Lite LF 3 Multilayer Compression Bandaging System 3 x Per Week/15 Days Discharge Instructions: Apply 3 multi-layer wrap as prescribed. Compression Stockings: Circaid Juxta Lite Compression Wrap Compression Amount: 30-40 mmHg (right) Discharge Instructions: Apply Circaid Juxta Lite Compression Wrap as directed WOUND #7: - Calcaneus Wound Laterality: Left Cleanser: Dakin 16 (oz) 0.25 1 x Per Day/15 Days Discharge Instructions: Use as directed. Cleanser: Normal Saline 1 x Per JKD/32 Days Discharge Instructions: Wash your  hands with soap and water. Remove old dressing, discard into plastic bag and place into trash. Cleanse the wound with Normal Saline prior to applying a clean dressing using gauze sponges, not tissues or cotton balls. Do not scrub or use excessive force. Pat dry using gauze sponges, not tissue or cotton balls. Cleanser: Soap and Water 1 x Per Day/15 Days Discharge Instructions: Gently cleanse wound with antibacterial soap, rinse and pat dry prior to dressing wounds Cleanser: Wound Cleanser 1 x Per Day/15 Days Discharge Instructions: Wash your hands with soap and water. Remove old dressing, discard into plastic bag and place into trash. Cleanse the wound with Wound Cleanser prior to applying a clean dressing using gauze sponges, not tissues or cotton balls. Do not scrub or use excessive force. Pat dry using gauze sponges, not tissue or cotton balls. Topical: Triamcinolone Acetonide Cream, 0.1%, 15 (g) tube 1 x Per Day/15 Days Discharge Instructions: Mixed with half Eucerin or alternative-to bilateral lower legsApply as directed by provider. Topical: Eucerin lotion 1 x Per Day/15 Days Discharge Instructions: Mixed 1/2 with TCA to  bilateral lower legs Primary Dressing: Gauze 1 x Per Day/15 Days Discharge Instructions: Dakins lightly soaked gauze Secondary Dressing: (NON-BORDER) Zetuvit Plus Silicone NON-BORDER 5x5 (in/in) 1 x Per Day/15 Days Secured With: Medipore Tape - 74M Medipore H Soft Cloth Surgical Tape, 2x2 (in/yd) 1 x Per Day/15 Days Secured With: Kerlix Roll Sterile or Non-Sterile 6-ply 4.5x4 (yd/yd) 1 x Per Day/15 Days Discharge Instructions: Apply Kerlix as directed Compression Wrap: Left leg JUXTELITE WITH TUBI GRIP UNDER 1 x Per Day/15 Days WOUND #9: - Toe Great Wound Laterality: Right Cleanser: Normal Saline 3 x Per Week/15 Days [name], [name] ([id number]) Discharge Instructions: Wash your hands with soap and water. Remove old dressing, discard into plastic bag and place into trash. Cleanse the wound with Normal Saline prior to applying a clean dressing using gauze sponges, not tissues or cotton balls. Do not scrub or use excessive force. Pat dry using gauze sponges, not tissue or cotton balls. Cleanser: Soap and Water 3 x Per Week/15 Days Discharge Instructions: Gently cleanse wound with antibacterial soap, rinse and pat dry prior to dressing wounds Cleanser: Wound Cleanser 3 x Per Week/15 Days Discharge Instructions: Wash your hands with soap and water. Remove old dressing, discard into plastic bag and place into trash. Cleanse the wound with Wound Cleanser prior to applying a clean dressing using gauze sponges, not tissues or cotton balls. Do not scrub or use excessive force. Pat dry using gauze sponges, not tissue or cotton balls. Topical: Triamcinolone Acetonide Cream, 0.1%, 15 (g) tube 3 x Per Week/15 Days Discharge Instructions: Mixed with half Eucerin or alternative-to bilateral lower legsApply as directed by provider. Topical: Eucerin lotion 3 x Per Week/15 Days Discharge Instructions: Mixed 1/2 with TCA to bilateral lower legs Primary Dressing: Iodosorb 40 (g) 3 x Per Week/15  Days Discharge Instructions: Apply IodoSorb to wound bed only as directed. Secondary Dressing: (NON-BORDER) Zetuvit Plus Silicone NON-BORDER 5x5 (in/in) 3 x Per Week/15 Days Compression Wrap: 3-LAYER WRAP - Profore Lite LF 3 Multilayer Compression Bandaging System 3 x Per Week/15 Days Discharge Instructions: Apply 3 multi-layer wrap as prescribed. 1. I did not change any of the dressings. Continue to use Dakin's to the left heel and Iodoflex/Iodosorb to the areas on the right. 2. The patient has significant chronic venous insufficiency with skin changes on the right. We are wrapping the right leg but he uses his compression stocking on the left  Electronic Signature(s) Signed: 07/07/2021 1:04:01 PM By: Alec Ham MD Entered By: [name] on 07/07/2021 09:50:44 Amara, [name] ([id number]) -------------------------------------------------------------------------------- SuperBill Details Patient Name: [name] Date of Service: 07/07/2021 Medical Record Number: [id number] Patient Account Number: [id number] Date of Birth/Sex: December 30, 1955 (66 y.o. M) Treating RN: [name] Primary Care Provider: Fulton Wood Other Clinician: Referring Provider: Fulton Wood Treating Provider/Extender: [name] in Treatment: 6 Diagnosis Coding ICD-10 Codes Code Description [phone] Pressure ulcer of left heel, stage 3 E11.621 Type 2 diabetes mellitus with foot ulcer L97.522 Non-pressure chronic ulcer of other part of left foot with fat layer exposed L89.893 Pressure ulcer of other site, stage 3 I87.2 Venous insufficiency (chronic) (peripheral) I50.42 Chronic combined systolic (congestive) and diastolic (congestive) heart failure I10 Essential (primary) hypertension L97.818 Non-pressure chronic ulcer of other part of right lower leg with other specified severity Facility Procedures CPT4 Code: [id number] Description: 11042 - DEB SUBQ TISSUE 20 SQ  CM/< Modifier: Quantity: 1 CPT4 Code: Description: ICD-10 Diagnosis Description L89.623 Pressure ulcer of left heel, stage 3 E11.621 Type 2 diabetes mellitus with foot ulcer Modifier: Quantity: Physician Procedures CPT4 Code: [id number] Description: 11042 - WC PHYS SUBQ TISS 20 SQ CM Modifier: Quantity: 1 CPT4 Code: Description: ICD-10 Diagnosis Description L89.623 Pressure ulcer of left heel, stage 3 E11.621 Type 2 diabetes mellitus with foot ulcer Modifier: Quantity: Electronic Signature(s) Signed: 07/07/2021 1:04:01 PM By: Alec Ham MD Entered By: [name] on 07/07/2021 09:51:03

## 2021-07-07 NOTE — Progress Notes (Signed)
[name], [name] ([id number]) Visit Report for 07/07/2021 Arrival Information Details Patient Name: [name], [name] Date of Service: 07/07/2021 8:30 AM Medical Record Number: [id number] Patient Account Number: [id number] Date of Birth/Sex: [birth year] (66 y.o. M) Treating RN: Cornell Barman Primary Care Lella Mullany: Fulton Reek Other Clinician: Referring Kaila Devries: Fulton Reek Treating Ronit Marczak/Extender: Tito Dine in Treatment: 6 Visit Information History Since Last Visit Added or deleted any medications: No Patient Arrived: Knee Scooter Has Dressing in Place as Prescribed: Yes Arrival Time: 08:42 Has Compression in Place as Prescribed: Yes Accompanied By: wife Has Footwear/Offloading in Place as Prescribed: Yes Transfer Assistance: None Left: Surgical Shoe with Pressure Relief Patient Identification Verified: Yes Insole Secondary Verification Process Completed: Yes Right: Surgical Shoe with Pressure Relief Patient Requires Transmission-Based No Insole Precautions: Pain Present Now: No Patient Has Alerts: Yes Patient Alerts: Patient on Blood Thinner DIABETIC Eliquis/Aspirin 81 ABI at [facility] 06/14/21 L 0.88; R 0.87 Electronic Signature(s) Signed: 07/07/2021 12:57:19 PM By: Gretta Cool, BSN, RN, CWS, Kim RN, BSN Entered By: Gretta Cool, BSN, RN, CWS, Kim on 07/07/2021 08:43:38 Damewood, [name] ([id number]) -------------------------------------------------------------------------------- Lower Extremity Assessment Details Patient Name: Clermont, [name] Date of Service: 07/07/2021 8:30 AM Medical Record Number: [id number] Patient Account Number: [id number] Date of Birth/Sex: [name] (66 y.o. M) Treating RN: Cornell Barman Primary Care Shealynn Saulnier: Fulton Reek Other Clinician: Referring Hung Rhinesmith: Fulton Reek Treating Lamichael Youkhana/Extender: Tito Dine in Treatment: 6 Edema Assessment Assessed: [Left: No] [Right: No] [Left: Edema] [Right: :] Calf Left:  Right: Point of Measurement: 36 cm From Medial Instep 48 cm 50 cm Ankle Left: Right: Point of Measurement: 12 cm From Medial Instep 31 cm 28.5 cm Vascular Assessment Pulses: Dorsalis Pedis Palpable: [Left:Yes] [Right:Yes] Electronic Signature(s) Signed: 07/07/2021 12:57:19 PM By: Gretta Cool, BSN, RN, CWS, Kim RN, BSN Entered By: Gretta Cool, BSN, RN, CWS, Kim on 07/07/2021 09:02:36 Bame, [name] ([id number]) -------------------------------------------------------------------------------- Multi Wound Chart Details Patient Name: Heeter, [name] Date of Service: 07/07/2021 8:30 AM Medical Record Number: [id number] Patient Account Number: [id number] Date of Birth/Sex: [name], [name] (66 y.o. M) Treating RN: Cornell Barman Primary Care Reine Bristow: Fulton Reek Other Clinician: Referring Annabeth Tortora: Fulton Reek Treating Beckam Abdulaziz/Extender: Tito Dine in Treatment: 6 Vital Signs Height(in): 73 Pulse(bpm): 108 Weight(lbs): 371 Blood Pressure(mmHg): 136/81 Body Mass Index(BMI): 48.9 Temperature(F): 98.2 Respiratory Rate(breaths/min): 18 Photos: [9:No Photos] Wound Location: Right Lower Leg Left Calcaneus Right Toe Great Wounding Event: Gradually Appeared Gradually Appeared Gradually Appeared Primary Etiology: Venous Leg Ulcer Pressure Ulcer Pressure Ulcer Secondary Etiology: [name] [name] Diabetic Wound/Ulcer of the Lower Extremity Comorbid History: Lymphedema, Asthma, Chronic Lymphedema, Asthma, Chronic [name] Obstructive Pulmonary Disease Obstructive Pulmonary Disease (COPD), Sleep Apnea, Arrhythmia, (COPD), Sleep Apnea, Arrhythmia, Congestive Heart Failure, Coronary Congestive Heart Failure, Coronary Artery Disease, Hypertension, Artery Disease, Hypertension, Myocardial Infarction, Peripheral Myocardial Infarction, Peripheral Venous Disease, Colitis, Type II Venous Disease, Colitis, Type II Diabetes, History of pressure Diabetes, History of pressure wounds, Gout, Osteoarthritis  wounds, Gout, Osteoarthritis Date Acquired: 06/16/2021 05/05/2021 04/26/2021 Weeks of Treatment: _0 Wound Status: Open Open Open Wound Recurrence: No No No Pending Amputation on No Yes Yes Presentation: Measurements L x W x D (cm) 0.5x0.5x0.2 3.6x1.5x1.2 0.9x0.7x0.1 Area (cm) : 0.196 4.241 0.495 Volume (cm) : 0.039 5.089 0.049 % Reduction in Area: -24.80% 25.60% 21.20% % Reduction in Volume: -143.70% 10.80% Wood.20% Classification: Full Thickness Without Exposed Category/Stage III Category/Stage III Support Structures Exudate Amount: Medium Large Medium Exudate Type: Serosanguineous Serosanguineous Serosanguineous Exudate Color: red, brown red, brown red, brown Wound Margin: [name]  Thickened [name] Granulation Amount: Large (67-100%) Small (1-33%) [name] Granulation Quality: Red, Pale Pink [name] Necrotic Amount: None Present (0%) Large (67-100%) [name] Necrotic Tissue: [name] Eschar, Adherent Slough [name] Exposed Structures: Fat Layer (Subcutaneous Tissue): Fat Layer (Subcutaneous Tissue): [name] Yes Yes Fascia: No Fascia: No Tendon: No Tendon: No Muscle: No Muscle: No Joint: No Joint: No Bone: No Bone: No Epithelialization: Large (67-100%) [name] [name] Mcclune, Khamron A. ([id number]) Debridement: [name] Debridement - Excisional [name] Pre-procedure Verification/Time [name] 09:23 [name] Out Taken: Tissue Debrided: [name] Subcutaneous, Slough [name] Level: [name] Skin/Subcutaneous Tissue [name] Debridement Area (sq cm): [name] 5.4 [name] Instrument: [name] Curette [name] Bleeding: [name] Moderate [name] Hemostasis Achieved: [name] Silver Nitrate [name] Debridement Treatment [name] Procedure was tolerated well [name] Response: Post Debridement [name] 3.6x1.5x1.2 [name] Measurements L x W x D (cm) Post Debridement Volume: [name] 5.089 [name] (cm) Post Debridement Stage: [name] Category/Stage III [name] Procedures Performed: [name] Debridement [name] Treatment Notes Electronic Signature(s) Signed: 07/07/2021 12:57:19 PM By: Gretta Cool, BSN, RN, CWS, Kim RN, BSN Entered  By: Gretta Cool, BSN, RN, CWS, Kim on 07/07/2021 09:54:16 [name], [name] ([id number]) -------------------------------------------------------------------------------- Multi-Disciplinary Care Plan Details Patient Name: Koziel, [name] Date of Service: 07/07/2021 8:30 AM Medical Record Number: [id number] Patient Account Number: [id number] Date of Birth/Sex: [birth year] (66 y.o. M) Treating RN: Cornell Barman Primary Care Emerald Gehres: Fulton Reek Other Clinician: Referring Rekia Kujala: Fulton Reek Treating Makinzey Banes/Extender: Tito Dine in Treatment: 6 Active Inactive Wound/Skin Impairment Nursing Diagnoses: Impaired tissue integrity Knowledge deficit related to smoking impact on wound healing Knowledge deficit related to ulceration/compromised skin integrity Goals: Patient will have a decrease in wound volume by X% from date: (specify in notes) Date Initiated: 05/26/2021 Target Resolution Date: 06/23/2021 Goal Status: Active Patient/caregiver will verbalize understanding of skin care regimen Date Initiated: 05/26/2021 Date Inactivated: 06/02/2021 Target Resolution Date: 06/09/2021 Goal Status: Met Interventions: Assess patient/caregiver ability to obtain necessary supplies Assess patient/caregiver ability to perform ulcer/skin care regimen upon admission and as needed Notes: Electronic Signature(s) Signed: 07/07/2021 12:57:19 PM By: Gretta Cool, BSN, RN, CWS, Kim RN, BSN Entered By: Gretta Cool, BSN, RN, CWS, Kim on 07/07/2021 09:07:25 Lalone, [name] ([id number]) -------------------------------------------------------------------------------- Pain Assessment Details Patient Name: Mccutchan, [name] Date of Service: 07/07/2021 8:30 AM Medical Record Number: [id number] Patient Account Number: [id number] Date of Birth/Sex: [birth year] (66 y.o. M) Treating RN: Cornell Barman Primary Care Almas Rake: Fulton Reek Other Clinician: Referring Velvie Thomaston: Fulton Reek Treating  Brylie Sneath/Extender: Tito Dine in Treatment: 6 Active Problems Location of Pain Severity and Description of Pain Patient Has Paino Yes Site Locations Pain Location: Pain in Ulcers With Dressing Change: Yes Rate the pain. Current Pain Level: 3 Character of Pain Describe the Pain: Tender, Throbbing Pain Management and Medication Current Pain Management: Electronic Signature(s) Signed: 07/07/2021 12:57:19 PM By: Gretta Cool, BSN, RN, CWS, Kim RN, BSN Entered By: Gretta Cool, BSN, RN, CWS, Kim on 07/07/2021 08:44:45 [name], [name] ([id number]) -------------------------------------------------------------------------------- Patient/Caregiver Education Details Patient Name: Alec Schooner A. Date of Service: 07/07/2021 8:30 AM Medical Record Number: [id number] Patient Account Number: [id number] Date of Birth/Gender: [birth year][name] (66 y.o. M) Treating RN: Cornell Barman Primary Care Physician: Fulton Reek Other Clinician: Referring Physician: Fulton Reek Treating Physician/Extender: Tito Dine in Treatment: 6 Education Assessment Education Provided To: Patient Education Topics Provided Wound/Skin Impairment: Handouts: Caring for Your Ulcer Methods: Demonstration, Explain/Verbal Responses: State content correctly Electronic Signature(s) Signed: 07/07/2021 12:57:19 PM By: Gretta Cool, BSN, RN, CWS, Kim RN, BSN Entered By: Gretta Cool, BSN, RN, CWS, Kim on 07/07/2021 09:54:57 Hochman, [name] ([id number]) --------------------------------------------------------------------------------  Wound Assessment Details Patient Name: [name], [name] Date of Service: 07/07/2021 8:30 AM Medical Record Number: [id number] Patient Account Number: [id number] Date of Birth/Sex: [name] (66 y.o. M) Treating RN: Cornell Barman Primary Care Gearlene Godsil: Fulton Reek Other Clinician: Referring Haris Baack: Fulton Reek Treating Payden Bonus/Extender: Tito Dine in Treatment:  6 Wound Status Wound Number: 11 Primary Venous Leg Ulcer Etiology: Wound Location: Right Lower Leg Wound Open Wounding Event: Gradually Appeared Status: Date Acquired: 06/16/2021 Comorbid Lymphedema, Asthma, Chronic Obstructive Pulmonary Weeks Of Treatment: 3 History: Disease (COPD), Sleep Apnea, Arrhythmia, Congestive Clustered Wound: No Heart Failure, Coronary Artery Disease, Hypertension, Myocardial Infarction, Peripheral Venous Disease, Colitis, Type II Diabetes, History of pressure wounds, Gout, Osteoarthritis Photos Wound Measurements Length: (cm) 0.5 Width: (cm) 0.5 Depth: (cm) 0.2 Area: (cm) 0.196 Volume: (cm) 0.039 % Reduction in Area: -24.8% % Reduction in Volume: -143.7% Epithelialization: Large (67-100%) Tunneling: No Undermining: No Wound Description Classification: Full Thickness Without Exposed Support Structures Exudate Amount: Medium Exudate Type: Serosanguineous Exudate Color: red, brown Foul Odor After Cleansing: No Slough/Fibrino No Wound Bed Granulation Amount: Large (67-100%) Exposed Structure Granulation Quality: Red, Pale Fascia Exposed: No Necrotic Amount: None Present (0%) Fat Layer (Subcutaneous Tissue) Exposed: Yes Tendon Exposed: No Muscle Exposed: No Joint Exposed: No Bone Exposed: No Treatment Notes Wound #11 (Lower Leg) Wound Laterality: Right Cleanser Normal Saline Tangney, Aydian A. ([id number]) Discharge Instruction: Wash your hands with soap and water. Remove old dressing, discard into plastic bag and place into trash. Cleanse the wound with Normal Saline prior to applying a clean dressing using gauze sponges, not tissues or cotton balls. Do not scrub or use excessive force. Pat dry using gauze sponges, not tissue or cotton balls. Soap and Water Discharge Instruction: Gently cleanse wound with antibacterial soap, rinse and pat dry prior to dressing wounds Wound Cleanser Discharge Instruction: Wash your hands with soap and  water. Remove old dressing, discard into plastic bag and place into trash. Cleanse the wound with Wound Cleanser prior to applying a clean dressing using gauze sponges, not tissues or cotton balls. Do not scrub or use excessive force. Pat dry using gauze sponges, not tissue or cotton balls. Peri-Wound Care Topical Triamcinolone Acetonide Cream, 0.1%, 15 (g) tube Discharge Instruction: Mixed with half Eucerin or alternative-to bilateral lower legsApply as directed by Ames Hoban. Eucerin lotion Discharge Instruction: Mixed 1/2 with TCA to bilateral lower legs Primary Dressing Iodosorb 40 (g) Discharge Instruction: Apply IodoSorb to wound bed only as directed. Secondary Dressing (NON-BORDER) Zetuvit Plus Silicone NON-BORDER 5x5 (in/in) Secured With Compression Wrap 3-LAYER WRAP - Profore Lite LF 3 Multilayer Compression Bandaging System Discharge Instruction: Apply 3 multi-layer wrap as prescribed. Compression Stockings Circaid Juxta Lite Compression Wrap Quantity: 1 Right Leg Compression Amount: 30-40 mmHg Discharge Instruction: Apply Circaid Juxta Lite Compression Wrap as directed Add-Ons Electronic Signature(s) Signed: 07/07/2021 12:57:19 PM By: Gretta Cool, BSN, RN, CWS, Kim RN, BSN Entered By: Gretta Cool, BSN, RN, CWS, Kim on 07/07/2021 08:56:18 Honea, [name] ([id number]) -------------------------------------------------------------------------------- Wound Assessment Details Patient Name: Leger, [name] Date of Service: 07/07/2021 8:30 AM Medical Record Number: [id number] Patient Account Number: [id number] Date of Birth/Sex: [name] (66 y.o. M) Treating RN: Cornell Barman Primary Care Lille Karim: Fulton Reek Other Clinician: Referring Arieona Swaggerty: Fulton Reek Treating Asad Keeven/Extender: Tito Dine in Treatment: 6 Wound Status Wound Number: 7 Primary Pressure Ulcer Etiology: Wound Location: Left Calcaneus Wound Open Wounding Event: Gradually  Appeared Status: Date Acquired: 05/05/2021 Comorbid Lymphedema, Asthma, Chronic Obstructive Pulmonary Weeks Of Treatment: 6 History: Disease (COPD),  Sleep Apnea, Arrhythmia, Congestive Clustered Wound: No Heart Failure, Coronary Artery Disease, Hypertension, Pending Amputation On Presentation Myocardial Infarction, Peripheral Venous Disease, Colitis, Type II Diabetes, History of pressure wounds, Gout, Osteoarthritis Photos Wound Measurements Length: (cm) 3.6 % Re Width: (cm) 1.5 % Re Depth: (cm) 1.2 Area: (cm) 4.241 Volume: (cm) 5.089 duction in Area: 25.6% duction in Volume: 10.8% Wound Description Classification: Category/Stage III Fou Wound Margin: Thickened Slo Exudate Amount: Large Exudate Type: Serosanguineous Exudate Color: red, brown l Odor After Cleansing: No ugh/Fibrino Yes Wound Bed Granulation Amount: Small (1-33%) Exposed Structure Granulation Quality: Pink Fascia Exposed: No Necrotic Amount: Large (67-100%) Fat Layer (Subcutaneous Tissue) Exposed: Yes Necrotic Quality: Eschar, Adherent Slough Tendon Exposed: No Muscle Exposed: No Joint Exposed: No Bone Exposed: No Treatment Notes Wound #7 (Calcaneus) Wound Laterality: Left Cleanser Dakin 16 (oz) 0.25 Haros, Obdulio A. ([id number]) Discharge Instruction: Use as directed. Normal Saline Discharge Instruction: Wash your hands with soap and water. Remove old dressing, discard into plastic bag and place into trash. Cleanse the wound with Normal Saline prior to applying a clean dressing using gauze sponges, not tissues or cotton balls. Do not scrub or use excessive force. Pat dry using gauze sponges, not tissue or cotton balls. Soap and Water Discharge Instruction: Gently cleanse wound with antibacterial soap, rinse and pat dry prior to dressing wounds Wound Cleanser Discharge Instruction: Wash your hands with soap and water. Remove old dressing, discard into plastic bag and place into trash. Cleanse the  wound with Wound Cleanser prior to applying a clean dressing using gauze sponges, not tissues or cotton balls. Do not scrub or use excessive force. Pat dry using gauze sponges, not tissue or cotton balls. Peri-Wound Care Topical Triamcinolone Acetonide Cream, 0.1%, 15 (g) tube Discharge Instruction: Mixed with half Eucerin or alternative-to bilateral lower legsApply as directed by Lekendrick Alpern. Eucerin lotion Discharge Instruction: Mixed 1/2 with TCA to bilateral lower legs Primary Dressing Gauze Discharge Instruction: Dakins lightly soaked gauze Secondary Dressing (NON-BORDER) Zetuvit Plus Silicone NON-BORDER 5x5 (in/in) Secured With Medipore Tape - 75M Medipore H Soft Cloth Surgical Tape, 2x2 (in/yd) Kerlix Roll Sterile or Non-Sterile 6-ply 4.5x4 (yd/yd) Discharge Instruction: Apply Kerlix as directed Compression Wrap Left leg JUXTELITE WITH TUBI GRIP UNDER Compression Stockings Add-Ons Electronic Signature(s) Signed: 07/07/2021 12:57:19 PM By: Gretta Cool, BSN, RN, CWS, Kim RN, BSN Entered By: Gretta Cool, BSN, RN, CWS, Kim on 07/07/2021 08:59:58 Nagy, [name] ([id number]) -------------------------------------------------------------------------------- Wound Assessment Details Patient Name: Rainbow, [name] Date of Service: 07/07/2021 8:30 AM Medical Record Number: [id number] Patient Account Number: [id number] Date of Birth/Sex: [birth year][name] (66 y.o. M) Treating RN: Cornell Barman Primary Care Yuepheng Schaller: Fulton Reek Other Clinician: Referring Francely Craw: Fulton Reek Treating Dosha Broshears/Extender: Tito Dine in Treatment: 6 Wound Status Wound Number: 9 Primary Etiology: Pressure Ulcer Wound Location: Right Toe Great Secondary Etiology: Diabetic Wound/Ulcer of the Lower Extremity Wounding Event: Gradually Appeared Wound Status: Open Date Acquired: 04/26/2021 Weeks Of Treatment: 6 Clustered Wound: No Pending Amputation On Presentation Photos Photo Uploaded By: Gretta Cool, BSN,  RN, CWS, Kim on 07/07/2021 12:55:18 Wound Measurements Length: (cm) 0.9 Width: (cm) 0.7 Depth: (cm) 0.1 Area: (cm) 0.495 Volume: (cm) 0.049 % Reduction in Area: 21.2% % Reduction in Volume: Wood.2% Wound Description Classification: Category/Stage III Exudate Amount: Medium Exudate Type: Serosanguineous Exudate Color: red, brown Treatment Notes Wound #9 (Toe Great) Wound Laterality: Right Cleanser Normal Saline Discharge Instruction: Wash your hands with soap and water. Remove old dressing, discard into plastic bag and place into trash. Cleanse the wound  with Normal Saline prior to applying a clean dressing using gauze sponges, not tissues or cotton balls. Do not scrub or use excessive force. Pat dry using gauze sponges, not tissue or cotton balls. Soap and Water Discharge Instruction: Gently cleanse wound with antibacterial soap, rinse and pat dry prior to dressing wounds Wound Cleanser Discharge Instruction: Wash your hands with soap and water. Remove old dressing, discard into plastic bag and place into trash. Cleanse the wound with Wound Cleanser prior to applying a clean dressing using gauze sponges, not tissues or cotton balls. Do not scrub or use excessive force. Pat dry using gauze sponges, not tissue or cotton balls. Peri-Wound Care Barcomb, Jabaree A. ([id number]) Topical Triamcinolone Acetonide Cream, 0.1%, 15 (g) tube Discharge Instruction: Mixed with half Eucerin or alternative-to bilateral lower legsApply as directed by Ariyana Faw. Eucerin lotion Discharge Instruction: Mixed 1/2 with TCA to bilateral lower legs Primary Dressing Iodosorb 40 (g) Discharge Instruction: Apply IodoSorb to wound bed only as directed. Secondary Dressing (NON-BORDER) Zetuvit Plus Silicone NON-BORDER 5x5 (in/in) Secured With Compression Wrap 3-LAYER WRAP - Profore Lite LF 3 Multilayer Compression Bandaging System Discharge Instruction: Apply 3 multi-layer wrap as prescribed. Compression  Stockings Environmental education officer) Signed: 07/07/2021 12:57:19 PM By: Gretta Cool, BSN, RN, CWS, Kim RN, BSN Entered By: Gretta Cool, BSN, RN, CWS, Kim on 07/07/2021 08:57:17 Mier, [name] ([id number]) -------------------------------------------------------------------------------- Vitals Details Patient Name: Alec Schooner A. Date of Service: 07/07/2021 8:30 AM Medical Record Number: [id number] Patient Account Number: [id number] Date of Birth/Sex: [birth year][name] (66 y.o. M) Treating RN: Cornell Barman Primary Care Analeia Ismael: Fulton Reek Other Clinician: Referring Allysen Lazo: Fulton Reek Treating Nylan Nevel/Extender: Tito Dine in Treatment: 6 Vital Signs Time Taken: 08:43 Temperature (F): 98.2 Height (in): 73 Pulse (bpm): 108 Weight (lbs): 371 Respiratory Rate (breaths/min): 18 Body Mass Index (BMI): 48.9 Blood Pressure (mmHg): 136/81 Reference Range: 80 - 120 mg / dl Electronic Signature(s) Signed: 07/07/2021 12:57:19 PM By: Gretta Cool, BSN, RN, CWS, Kim RN, BSN Entered By: Gretta Cool, BSN, RN, CWS, Kim on 07/07/2021 08:43:58

## 2021-07-14 ENCOUNTER — Encounter: Payer: Medicare Other | Admitting: Physician Assistant

## 2021-07-14 ENCOUNTER — Other Ambulatory Visit: Payer: Self-pay

## 2021-07-14 DIAGNOSIS — I70245 Atherosclerosis of native arteries of left leg with ulceration of other part of foot: Secondary | ICD-10-CM | POA: Diagnosis not present

## 2021-07-14 DIAGNOSIS — Z89421 Acquired absence of other right toe(s): Secondary | ICD-10-CM | POA: Diagnosis not present

## 2021-07-14 DIAGNOSIS — E11621 Type 2 diabetes mellitus with foot ulcer: Secondary | ICD-10-CM | POA: Diagnosis not present

## 2021-07-14 DIAGNOSIS — I872 Venous insufficiency (chronic) (peripheral): Secondary | ICD-10-CM | POA: Diagnosis not present

## 2021-07-14 DIAGNOSIS — Z794 Long term (current) use of insulin: Secondary | ICD-10-CM | POA: Diagnosis not present

## 2021-07-14 DIAGNOSIS — I70235 Atherosclerosis of native arteries of right leg with ulceration of other part of foot: Secondary | ICD-10-CM | POA: Diagnosis not present

## 2021-07-14 DIAGNOSIS — I5042 Chronic combined systolic (congestive) and diastolic (congestive) heart failure: Secondary | ICD-10-CM | POA: Diagnosis not present

## 2021-07-14 DIAGNOSIS — I10 Essential (primary) hypertension: Secondary | ICD-10-CM | POA: Diagnosis not present

## 2021-07-14 DIAGNOSIS — L89623 Pressure ulcer of left heel, stage 3: Secondary | ICD-10-CM | POA: Diagnosis not present

## 2021-07-14 DIAGNOSIS — L97522 Non-pressure chronic ulcer of other part of left foot with fat layer exposed: Secondary | ICD-10-CM | POA: Diagnosis not present

## 2021-07-14 DIAGNOSIS — L97512 Non-pressure chronic ulcer of other part of right foot with fat layer exposed: Secondary | ICD-10-CM | POA: Diagnosis not present

## 2021-07-14 DIAGNOSIS — L89893 Pressure ulcer of other site, stage 3: Secondary | ICD-10-CM | POA: Diagnosis not present

## 2021-07-14 DIAGNOSIS — I2510 Atherosclerotic heart disease of native coronary artery without angina pectoris: Secondary | ICD-10-CM | POA: Diagnosis not present

## 2021-07-14 DIAGNOSIS — I110 Hypertensive heart disease with heart failure: Secondary | ICD-10-CM | POA: Diagnosis not present

## 2021-07-14 DIAGNOSIS — L97812 Non-pressure chronic ulcer of other part of right lower leg with fat layer exposed: Secondary | ICD-10-CM | POA: Diagnosis not present

## 2021-07-14 DIAGNOSIS — L97818 Non-pressure chronic ulcer of other part of right lower leg with other specified severity: Secondary | ICD-10-CM | POA: Diagnosis not present

## 2021-07-14 DIAGNOSIS — E11622 Type 2 diabetes mellitus with other skin ulcer: Secondary | ICD-10-CM | POA: Diagnosis not present

## 2021-07-14 NOTE — Progress Notes (Addendum)
Luhn, Corvin A. ([id number]) ?Visit Report for 07/14/2021 ?Chief Complaint Document Details ?Patient Name: [name], [name] ?Date of Service: 07/14/2021 1:45 PM ?Medical Record Number: [id number] ?Patient Account Number: [id number] ?Date of Birth/Sex: [birth year] (66 y.o. M) ?Treating RN: Donnamarie Poag ?Primary Care Provider: Fulton Reek Other Clinician: ?Referring Provider: Fulton Reek ?Treating Provider/Extender: Jeri Cos ?Weeks in Treatment: 7 ?Information Obtained from: Patient ?Chief Complaint ?Bilateral LE Ulcers ?Electronic Signature(s) ?Signed: 07/14/2021 1:42:09 PM By: Worthy Keeler PA-C ?Entered By: Worthy Keeler on 07/14/2021 13:42:08 ?Rathel, Dexter A. ([id number]) ?-------------------------------------------------------------------------------- ?HPI Details ?Patient Name: [name], [name] ?Date of Service: 07/14/2021 1:45 PM ?Medical Record Number: [id number] ?Patient Account Number: [id number] ?Date of Birth/Sex: [birth year] (66 y.o. M) ?Treating RN: Donnamarie Poag ?Primary Care Provider: Fulton Reek Other Clinician: ?Referring Provider: Fulton Reek ?Treating Provider/Extender: Jeri Cos ?Weeks in Treatment: 7 ?History of Present Illness ?HPI Description: Admission 8/31 ?[name] is a 66 year old male with a past medical history of uncontrolled insulin-dependent type 2 diabetes, CAD, osteomyelitis to the ?left third toe status post partial third ray amputation, and peripheral arterial disease that presents for a left plantar foot wound and bilateral lower ?extremity wounds. The left plantar foot wound has been present for at least 7 months. He has been following podiatry for this issue. He uses silver ?collagen to this area. He reports that in the past 1 to 2 months he has developed multiple scattered open wounds to his lower extremities ?bilaterally that have worsened over time. He states that they often develop as either blisters or he hits his leg against an object.He  is currently ?using Betadine to these areas. He denies signs of infection. He has compression stockings however he cannot put these on very easily. Also he is ?on his feet most of the day. He states he does not have anyone to help him do the activities he needs to get done. ?Readmission: ?05/26/2021 upon evaluation today patient presents for reevaluation here in the clinic concerning issues he is been having with his bilateral lower ?extremities, his right great toe, and left heel primarily. With that being said he has been in the hospital recently and his hemoglobin A1c on ?January 10 was 9.2. With that being said he does have a lot of really thickened dry skin attached to his legs when he try to remove that today. He ?is in agreement with the plan. Nonetheless I do believe that based on what we are seeing he is been having a lot of issues with his congestive ?heart failure I do have him on fluid pills he does go to the bathroom quite a bit. Subsequently has been in [employer] but I do not think that ?is doing quite enough to help control his edema. He also needs something better than Santyl to put on the heel right now when he was in the ?hospital they actually cut a space open in the compression around his heel so they can open it up and actually apply the Santyl daily. I think he ?needs something better than that. ?Again the patient does have a history of diabetes mellitus type 2, chronic venous insufficiency, hypertension, and congestive heart failure. He also ?has signs of stage III lymphedema as well. ?06/02/2021 upon evaluation today patient appears to be doing decently well in regard to his wounds. Fortunately I do not see any signs of active ?infection at this time which is great news. Fortunately I think that he is making excellent progress. Nonetheless I do think  that we seem to be ?making some progress here. ?06/09/2021 upon evaluation today patient appears to be doing well with regard to his legs all  things considered unfortunately the left heel is not ?doing nearly as well as what I like to see. There is a lot of necrotic tissue this is very boggy. I think that it is going to require potentially much ?more debridement but again we really need to get the results of the arterial testing done and this is actually scheduled for the 15th. ?2/17; arrives in clinic today with the left plantar heel wound completely necrotic and malodorous. Comparing the picture with last week this looks ?quite a bit worse. He has been using Iodoflex under compression. He has an area on the right first toe which seems clean and better and a new ?area on the right anterior mid Tibia. ?I was unable to find the results of his arterial studies that he had done on 1/15. Presumably they have not been read. I can see that they have ?been done ?06/23/2021 upon evaluation today patient appears to be doing well currently in regard to his legs. I am actually very pleased in that regard. ?Fortunately I do not see any signs of active infection at this time. I have reviewed his arterial study and it appeared to be pretty good with TBI's ?on the left of 0.88 on the right 0.87. Overall I think that we are definitely headed in the right direction which is great news. Fortunately I do not ?see any signs of active infection locally or systemically at this time. ?06/30/2021 upon evaluation today patient appears to be doing well with regard to the right lower extremity ulcers with very pleased in this regard. I ?do feel like the Iodoflex has done well here. In regard to the left heel location this is something that I am actually a little bit more concerned about ?I really think that switching over to the Dakin's moistened gauze dressing may be the best way to go at this point based on what we are seeing. ?We have been using Iodoflex is just not clearing up quite as well as I would like to see and I think this may be a better method at which to get ?things  under better control. The good news is he does have his Velcro compression wrap so we can try that at this point as well. ?3/10; patient has chronic venous insufficiency with a wound on the right anterior mid tibia, dorsal right first toe a large plantar left heel wound. ?We are using Dakin's on the left heel and Iodosorb on the right anterior lower extremity and the right first toe. The areas on the right are ?apparently a lot better. The left heel is certainly a lot better than I remember seeing this the last time 3 weeks ago. He is using a scooter to ?offload the left heel ?07/14/2021 upon evaluation today patient appears to be doing well with regard to his wounds in general. The heel is showing signs of improvement ?which is great on the left. On the right side leg this appears to be almost completely healed. The biggest issue is he has a lot of the dry skin ?building up I do believe that he is going to need to switch over to the Velcro wraps so he can put lotion on these areas to try to keep it from ?building up as much he will also be able to wash the leg which will be beneficial as  well in my opinion. We discussed that in greater detail today. ?Electronic Signature(s) ?Signed: 07/14/2021 5:08:38 PM By: Worthy Keeler PA-C ?Entered By: Worthy Keeler on 07/14/2021 17:08:38 ?Stegemann, Calton A. ([id number]) ?-------------------------------------------------------------------------------- ?Physical Exam Details ?Patient Name: MALEEK, CRAVER A. ?Date of Service: 07/14/2021 1:45 PM ?Medical Record Number: [id number] ?Patient Account Number: [id number] ?Date of Birth/Sex: 02-20-1956 (66 y.o. M) ?Treating RN: Donnamarie Poag ?Primary Care Provider: Fulton Reek Other Clinician: ?Referring Provider: Fulton Reek ?Treating Provider/Extender: Jeri Cos ?Weeks in Treatment: 7 ?Constitutional ?Obese and well-hydrated in no acute distress. ?Respiratory ?normal breathing without difficulty. ?Psychiatric ?this patient is  able to make decisions and demonstrates good insight into disease process. Alert and Oriented x 3. pleasant and cooperative. ?Notes ?Upon inspection no debridement was needed today which is great news the

## 2021-07-14 NOTE — Progress Notes (Addendum)
Fiorentino, Ceejay A. ([id number]) ?Visit Report for 07/14/2021 ?Arrival Information Details ?Patient Name: [name], [name] ?Date of Service: 07/14/2021 1:45 PM ?Medical Record Number: [id number] ?Patient Account Number: [id number] ?Date of Birth/Sex: [birth year] (66 y.o. M) ?Treating RN: Donnamarie Poag ?Primary Care Numan Zylstra: Fulton Reek Other Clinician: ?Referring Angellynn Kimberlin: Fulton Reek ?Treating Kathryn Cosby/Extender: Jeri Cos ?Weeks in Treatment: 7 ?Visit Information History Since Last Visit ?Added or deleted any medications: No ?Patient Arrived: Knee Scooter ?Had a fall or experienced change in No ?Arrival Time: 13:27 ?activities of daily living that may affect ?Accompanied By: wife ?risk of falls: ?Transfer Assistance: None ?Hospitalized since last visit: No ?Patient Identification Verified: Yes ?Has Dressing in Place as Prescribed: Yes ?Secondary Verification Process Completed: Yes ?Has Compression in Place as Prescribed: Yes ?Patient Requires Transmission-Based No ?Pain Present Now: No ?Precautions: ?Patient Has Alerts: Yes ?Patient Alerts: Patient on Blood ?Thinner ?DIABETIC ?Eliquis/Aspirin 81 ?ABI at [facility] 06/14/21 ?L 0.88; R 0.87 ?Electronic Signature(s) ?Signed: 07/14/2021 4:01:57 PM By: Donnamarie Poag ?Entered ByDonnamarie Poag on 07/14/2021 13:43:27 ?Oettinger, Claudie A. ([id number]) ?-------------------------------------------------------------------------------- ?Clinic Level of Care Assessment Details ?Patient Name: [name], [name] ?Date of Service: 07/14/2021 1:45 PM ?Medical Record Number: [id number] ?Patient Account Number: [id number] ?Date of Birth/Sex: July 11, 1955 (66 y.o. M) ?Treating RN: Donnamarie Poag ?Primary Care Alyana Kreiter: Fulton Reek Other Clinician: ?Referring Manna Gose: Fulton Reek ?Treating Ruthellen Tippy/Extender: Jeri Cos ?Weeks in Treatment: 7 ?Clinic Level of Care Assessment Items ?TOOL 4 Quantity Score ?'[]'$  - Use when only an EandM is performed on FOLLOW-UP visit 0 ?ASSESSMENTS - Nursing  Assessment / Reassessment ?X - Reassessment of Co-morbidities (includes updates in patient status) 1 10 ?X- 1 5 ?Reassessment of Adherence to Treatment Plan ?ASSESSMENTS - Wound and Skin Assessment / Reassessment ?'[]'$  - Simple Wound Assessment / Reassessment - one wound 0 ?X- 3 5 ?Complex Wound Assessment / Reassessment - multiple wounds ?'[]'$  - 0 ?Dermatologic / Skin Assessment (not related to wound area) ?ASSESSMENTS - Focused Assessment ?'[]'$  - Circumferential Edema Measurements - multi extremities 0 ?'[]'$  - 0 ?Nutritional Assessment / Counseling / Intervention ?'[]'$  - 0 ?Lower Extremity Assessment (monofilament, tuning fork, pulses) ?'[]'$  - 0 ?Peripheral Arterial Disease Assessment (using hand held doppler) ?ASSESSMENTS - Ostomy and/or Continence Assessment and Care ?'[]'$  - Incontinence Assessment and Management 0 ?'[]'$  - 0 ?Ostomy Care Assessment and Management (repouching, etc.) ?PROCESS - Coordination of Care ?X - Simple Patient / Family Education for ongoing care 1 15 ?'[]'$  - 0 ?Complex (extensive) Patient / Family Education for ongoing care ?X- 1 10 ?Staff obtains Consents, Records, Test Results / Process Orders ?X- 1 10 ?Staff telephones HHA, Nursing Homes / Clarify orders / etc ?'[]'$  - 0 ?Routine Transfer to another Facility (non-emergent condition) ?'[]'$  - 0 ?Routine Hospital Admission (non-emergent condition) ?'[]'$  - 0 ?New Admissions / Biomedical engineer / Ordering NPWT, Apligraf, etc. ?'[]'$  - 0 ?Emergency Hospital Admission (emergent condition) ?X- 1 10 ?Simple Discharge Coordination ?'[]'$  - 0 ?Complex (extensive) Discharge Coordination ?PROCESS - Special Needs ?'[]'$  - Pediatric / Minor Patient Management 0 ?'[]'$  - 0 ?Isolation Patient Management ?'[]'$  - 0 ?Hearing / Language / Visual special needs ?'[]'$  - 0 ?Assessment of Community assistance (transportation, D/C planning, etc.) ?'[]'$  - 0 ?Additional assistance / Altered mentation ?'[]'$  - 0 ?Support Surface(s) Assessment (bed, cushion, seat, etc.) ?INTERVENTIONS - Wound Cleansing /  Measurement ?Tall, Jewell A. ([id number]) ?'[]'$  - 0 ?Simple Wound Cleansing - one wound ?X- 3 5 ?Complex Wound Cleansing - multiple wounds ?X- 1 5 ?Wound Imaging (photographs - any number of  wounds) ?'[]'$  - 0 ?Wound Tracing (instead of photographs) ?'[]'$  - 0 ?Simple Wound Measurement - one wound ?X- 3 5 ?Complex Wound Measurement - multiple wounds ?INTERVENTIONS - Wound Dressings ?X - Small Wound Dressing one or multiple wounds 3 10 ?'[]'$  - 0 ?Medium Wound Dressing one or multiple wounds ?'[]'$  - 0 ?Large Wound Dressing one or multiple wounds ?X- 1 5 ?Application of Medications - topical ?'[]'$  - 0 ?Application of Medications - injection ?INTERVENTIONS - Miscellaneous ?'[]'$  - External ear exam 0 ?'[]'$  - 0 ?Specimen Collection (cultures, biopsies, blood, body fluids, etc.) ?'[]'$  - 0 ?Specimen(s) / Culture(s) sent or taken to Lab for analysis ?'[]'$  - 0 ?Patient Transfer (multiple staff / Civil Service fast streamer / Similar devices) ?'[]'$  - 0 ?Simple Staple / Suture removal (25 or less) ?'[]'$  - 0 ?Complex Staple / Suture removal (26 or more) ?'[]'$  - 0 ?Hypo / Hyperglycemic Management (close monitor of Blood Glucose) ?'[]'$  - 0 ?Ankle / Brachial Index (ABI) - do not check if billed separately ?X- 1 5 ?Vital Signs ?Has the patient been seen at the hospital within the last three years: Yes ?Total Score: 150 ?Level Of Care: New/Established - Level ?4 ?Electronic Signature(s) ?Signed: 07/14/2021 4:01:57 PM By: Donnamarie Poag ?Entered ByDonnamarie Poag on 07/14/2021 14:40:05 ?Rosemond, Fredric A. ([id number]) ?-------------------------------------------------------------------------------- ?Encounter Discharge Information Details ?Patient Name: [name], [name] ?Date of Service: 07/14/2021 1:45 PM ?Medical Record Number: [id number] ?Patient Account Number: [id number] ?Date of Birth/Sex: [birth year] (66 y.o. M) ?Treating RN: Donnamarie Poag ?Primary Care Sheppard Luckenbach: Fulton Reek Other Clinician: ?Referring Adriauna Campton: Fulton Reek ?Treating Krisi Azua/Extender: Jeri Cos ?Weeks  in Treatment: 7 ?Encounter Discharge Information Items ?Discharge Condition: Stable ?Ambulatory Status: Knee Scooter ?Discharge Destination: Home ?Transportation: Private Auto ?Accompanied By: wife ?Schedule Follow-up Appointment: Yes ?Clinical Summary of Care: ?Electronic Signature(s) ?Signed: 07/14/2021 4:01:57 PM By: Donnamarie Poag ?Entered ByDonnamarie Poag on 07/14/2021 14:52:19 ?Hartsough, Caedon A. ([id number]) ?-------------------------------------------------------------------------------- ?Lower Extremity Assessment Details ?Patient Name: KHAM, ZUCKERMAN A. ?Date of Service: 07/14/2021 1:45 PM ?Medical Record Number: [id number] ?Patient Account Number: [id number] ?Date of Birth/Sex: 25-Feb-1956 (66 y.o. M) ?Treating RN: Donnamarie Poag ?Primary Care Dylann Gallier: Fulton Reek Other Clinician: ?Referring Olumide Dolinger: Fulton Reek ?Treating Shreyan Hinz/Extender: Jeri Cos ?Weeks in Treatment: 7 ?Edema Assessment ?Assessed: [Left: Yes] [Right: Yes] ?Edema: [Left: Yes] [Right: Yes] ?Calf ?Left: Right: ?Point of Measurement: 36 cm From Medial Instep 48 cm 51 cm ?Ankle ?Left: Right: ?Point of Measurement: 12 cm From Medial Instep 29.5 cm 29 cm ?Electronic Signature(s) ?Signed: 07/14/2021 4:01:57 PM By: Donnamarie Poag ?Entered ByDonnamarie Poag on 07/14/2021 14:08:34 ?Rolison, Donne A. ([id number]) ?-------------------------------------------------------------------------------- ?Multi Wound Chart Details ?Patient Name: NORMA, IGNASIAK A. ?Date of Service: 07/14/2021 1:45 PM ?Medical Record Number: [id number] ?Patient Account Number: [id number] ?Date of Birth/Sex: [birth year] (66 y.o. M) ?Treating RN: Donnamarie Poag ?Primary Care Nial Hawe: Fulton Reek Other Clinician: ?Referring Bernisha Verma: Fulton Reek ?Treating Merla Sawka/Extender: Jeri Cos ?Weeks in Treatment: 7 ?Vital Signs ?Height(in): 73 ?Pulse(bpm): 108 ?Weight(lbs): 371 ?Blood Pressure(mmHg): 143/74 ?Body Mass Index(BMI): 48.9 ?Temperature(??F): 98.3 ?Respiratory  Rate(breaths/min): 18 ?Photos: ?Wound Location: Right Lower Leg Left Calcaneus Right Toe Great ?Wounding Event: Gradually Appeared Gradually Appeared Gradually Appeared ?Primary Etiology: Venous Leg Ulcer Pressure U

## 2021-07-17 ENCOUNTER — Other Ambulatory Visit (INDEPENDENT_AMBULATORY_CARE_PROVIDER_SITE_OTHER): Payer: Self-pay | Admitting: Vascular Surgery

## 2021-07-17 DIAGNOSIS — I739 Peripheral vascular disease, unspecified: Secondary | ICD-10-CM

## 2021-07-18 ENCOUNTER — Other Ambulatory Visit: Payer: Self-pay

## 2021-07-18 ENCOUNTER — Ambulatory Visit (INDEPENDENT_AMBULATORY_CARE_PROVIDER_SITE_OTHER): Payer: Medicare Other

## 2021-07-18 ENCOUNTER — Encounter (INDEPENDENT_AMBULATORY_CARE_PROVIDER_SITE_OTHER): Payer: Self-pay | Admitting: Vascular Surgery

## 2021-07-18 ENCOUNTER — Ambulatory Visit (INDEPENDENT_AMBULATORY_CARE_PROVIDER_SITE_OTHER): Payer: Medicare Other | Admitting: Vascular Surgery

## 2021-07-18 VITALS — BP 148/95 | HR 90 | Resp 16

## 2021-07-18 DIAGNOSIS — M7989 Other specified soft tissue disorders: Secondary | ICD-10-CM | POA: Diagnosis not present

## 2021-07-18 DIAGNOSIS — E1169 Type 2 diabetes mellitus with other specified complication: Secondary | ICD-10-CM | POA: Diagnosis not present

## 2021-07-18 DIAGNOSIS — I10 Essential (primary) hypertension: Secondary | ICD-10-CM | POA: Diagnosis not present

## 2021-07-18 DIAGNOSIS — E669 Obesity, unspecified: Secondary | ICD-10-CM

## 2021-07-18 DIAGNOSIS — I739 Peripheral vascular disease, unspecified: Secondary | ICD-10-CM | POA: Diagnosis not present

## 2021-07-18 DIAGNOSIS — L97522 Non-pressure chronic ulcer of other part of left foot with fat layer exposed: Secondary | ICD-10-CM

## 2021-07-18 NOTE — Assessment & Plan Note (Signed)
Swelling is under much better control after diuresis and Velcro compression system. ?

## 2021-07-18 NOTE — Progress Notes (Signed)
? ? ?MRN : [id number] ? ?[name]. is a 66 y.o. ([birth year]) male who presents with chief complaint of  ?Chief Complaint  ?Patient presents with  ? Follow-up  ?  Ultrasound follow up  ?. ? ?History of Present Illness: Patient returns today in follow up of his left foot ulceration, leg swelling, and PAD.  Things are getting better.  His swelling is under much better control.  His ulcer is healing and he is getting excellent local wound care from the wound care center.  He is very neuropathic and not having any pain at this point.  No signs of infection. ABIs today are 0.88 on the right and 1.05 on the left with biphasic waveforms and normal digital pressures and waveforms bilaterally. ? ?Current Outpatient Medications  ?Medication Sig Dispense Refill  ? albuterol (PROVENTIL) (2.5 MG/3ML) 0.083% nebulizer solution Take 2.5 mg by nebulization every 6 (six) hours as needed for wheezing or shortness of breath.    ? amiodarone (PACERONE) 200 MG tablet Take 200 mg by mouth daily.    ? amoxicillin-clavulanate (AUGMENTIN) 875-125 MG tablet Take 1 tablet by mouth 2 (two) times daily.    ? apixaban (ELIQUIS) 5 MG TABS tablet Take 1 tablet (5 mg total) by mouth 2 (two) times daily. 60 tablet 5  ? aspirin EC 81 MG EC tablet Take 1 tablet (81 mg total) by mouth daily. Swallow whole. 30 tablet 0  ? cholecalciferol (VITAMIN D3) 10 MCG (400 UNIT) TABS tablet Take 400 Units by mouth daily.    ? digoxin (LANOXIN) 0.125 MG tablet Take by mouth daily.    ? diltiazem (CARDIZEM CD) 300 MG 24 hr capsule Take 1 capsule (300 mg total) by mouth daily.    ? ezetimibe (ZETIA) 10 MG tablet Take 10 mg by mouth daily.    ? ferrous sulfate 325 (65 FE) MG tablet Take 325 mg by mouth daily with breakfast.    ? furosemide (LASIX) 20 MG tablet Take 3 tablets (60 mg total) by mouth daily. 90 tablet 0  ? gabapentin (NEURONTIN) 300 MG capsule Take 300 mg by mouth at bedtime.    ? insulin glargine (LANTUS) 100 UNIT/ML Solostar Pen Inject 40  Units into the skin 2 (two) times daily.    ? insulin lispro (HUMALOG) 100 UNIT/ML KwikPen Inject into the skin. Sliding scale    ? Insulin Syringes, Disposable, U-100 1 ML MISC by Does not apply route. 82mX 31G (15/64" length needle X 0.259m short needle    ? isosorbide mononitrate (IMDUR) 60 MG 24 hr tablet Take 60 mg by mouth 2 (two) times daily.    ? losartan (COZAAR) 100 MG tablet Take 100 mg by mouth daily.    ? metFORMIN (GLUCOPHAGE) 1000 MG tablet Take 1,000 mg by mouth 2 (two) times daily.     ? metoprolol tartrate (LOPRESSOR) 50 MG tablet Take 75 mg by mouth 2 (two) times daily.    ? MISC NATURAL PRODUCTS ER PO Take 300 mg by mouth daily. Brain Health Support    ? Multiple Vitamins-Minerals (MULTIVITAMIN WITH MINERALS) tablet Take 1 tablet by mouth daily.    ? nitroGLYCERIN (NITROSTAT) 0.4 MG SL tablet Place 0.4 mg under the tongue daily as needed.    ? pramipexole (MIRAPEX) 1 MG tablet Take 2 mg by mouth at bedtime.    ? primidone (MYSOLINE) 250 MG tablet Take 250 mg by mouth 2 (two) times daily.    ? tamsulosin (FLOMAX) 0.4 MG CAPS capsule  Take 0.4 mg by mouth daily.    ? traMADol (ULTRAM) 50 MG tablet Take by mouth every 6 (six) hours as needed.    ? vitamin B-12 (CYANOCOBALAMIN) 1000 MCG tablet Take 10,000 mcg by mouth daily.    ? vitamin C (ASCORBIC ACID) 500 MG tablet Take 500 mg by mouth daily.    ? zinc gluconate 50 MG tablet Take 50 mg by mouth daily.    ? Continuous Blood Gluc Sensor (FREESTYLE LIBRE 14 DAY SENSOR) MISC SMARTSIG:1 Each Topical Every 2 Weeks    ? ?No current facility-administered medications for this visit.  ? ? ?Past Medical History:  ?Diagnosis Date  ? Arrhythmia   ? atrial fibrillation  ? Asthma   ? CHF (congestive heart failure) ([place])   ? COPD (chronic obstructive pulmonary disease) ([place])   ? Coronary artery disease   ? Depression   ? Diabetes mellitus without complication ([place])   ? Gout   ? History anabolic steroid use   ? Hyperlipidemia   ? Hypertension   ? Hypogonadism in  male   ? Morbid obesity ([place])   ? Myocardial infarction [facility])   ? Peripheral vascular disease ([place])   ? Perirectal abscess   ? Pleurisy   ? Sleep apnea   ? CPAP at night, no oxygen  ? Varicella   ? ? ?Past Surgical History:  ?Procedure Laterality Date  ? ABDOMINAL AORTIC ANEURYSM REPAIR    ? ACHILLES TENDON SURGERY Left 01/10/2021  ? Procedure: ACHILLES LENGTHENING/KIDNER;  Surgeon: Caroline More, DPM;  Location: [facility] ORS;  Service: Podiatry;  Laterality: Left;  ? AMPUTATION TOE Right 02/10/2016  ? Procedure: AMPUTATION TOE 3RD TOE;  Surgeon: Samara Deist, DPM;  Location: [facility] OR[facility];  Service: Podiatry;  Laterality: Right;  ? AMPUTATION TOE Left 02/24/2020  ? Procedure: AMPUTATION TOE;  Surgeon: Caroline More, DPM;  Location: [facility] OR[facility];  Service: Podiatry;  Laterality: Left;  ? APPLICATION OF WOUND VAC Left 02/29/2020  ? Procedure: APPLICATION OF WOUND VAC;  Surgeon: Caroline More, DPM;  Location: [facility];  Service: Podiatry;  Laterality: Left;  ? COLONOSCOPY WITH PROPOFOL [name] 11/18/2015  ? Procedure: COLONOSCOPY WITH PROPOFOL;  Surgeon: Manya Silvas, MD;  Location: [facility];  Service: Endoscopy;  Laterality: [name];  ? CORONARY ARTERY BYPASS GRAFT    ? CORONARY STENT INTERVENTION [name] 02/02/2020  ? Procedure: CORONARY STENT INTERVENTION;  Surgeon: Isaias Cowman, MD;  Location: [place] [facility];  Service: Cardiovascular;  Laterality: [name];  ? IRRIGATION AND DEBRIDEMENT FOOT Left 02/29/2020  ? Procedure: IRRIGATION AND DEBRIDEMENT FOOT;  Surgeon: Caroline More, DPM;  Location: [facility] OR[facility];  Service: Podiatry;  Laterality: Left;  ? IRRIGATION AND DEBRIDEMENT FOOT Left 02/24/2020  ? Procedure: IRRIGATION AND DEBRIDEMENT FOOT;  Surgeon: Caroline More, DPM;  Location: [facility] OR[facility];  Service: Podiatry;  Laterality: Left;  ? KNEE ARTHROSCOPY    ? LEFT HEART CATH AND CORS/GRAFTS ANGIOGRAPHY [name] 02/02/2020  ? Procedure: LEFT HEART CATH AND CORS/GRAFTS ANGIOGRAPHY;  Surgeon: Teodoro Spray, MD;  Location: [place] [facility];  Service: Cardiovascular;  Laterality: [name];  ? LOWER EXTREMITY ANGIOGRAPHY Left 02/25/2020  ? Procedure: Lower Extremity Angiography;  Surgeon: Algernon Huxley, MD;  Location: [place] [facility];  Service: Cardiovascular;  Laterality: Left;  ? LOWER EXTREMITY ANGIOGRAPHY Left 01/04/2021  ? Procedure: LOWER EXTREMITY ANGIOGRAPHY;  Surgeon: Algernon Huxley, MD;  Location: [place] [facility];  Service: Cardiovascular;  Laterality: Left;  ? METATARSAL HEAD EXCISION Left 01/10/2021  ?  Procedure: METATARSAL HEAD EXCISION - LEFT 5th;  Surgeon: Caroline More, DPM;  Location: [facility] OR[facility];  Service: Podiatry;  Laterality: Left;  ? PERIPHERAL VASCULAR CATHETERIZATION Right 01/24/2016  ? Procedure: Lower Extremity Angiography;  Surgeon: Katha Cabal, MD;  Location: [place] [facility];  Service: Cardiovascular;  Laterality: Right;  ? PERIPHERAL VASCULAR CATHETERIZATION Right 01/25/2016  ? Procedure: Lower Extremity Angiography;  Surgeon: Katha Cabal, MD;  Location: [place] [facility];  Service: Cardiovascular;  Laterality: Right;  ? TOE AMPUTATION    ? TONSILLECTOMY    ? ? ?Social History  ?  ?     ?Tobacco Use  ? Smoking status: Former  ?    Packs/day: 0.50  ?    Years: 45.00  ?    Pack years: 22.50  ?    Types: Cigarettes  ?    Quit date: 04/05/2015  ?    Years since quitting: 6.0  ? Smokeless tobacco: Never  ?Vaping Use  ? Vaping Use: Every day  ?Substance Use Topics  ? Alcohol use: Not Currently  ?    Alcohol/week: 3.0 standard drinks  ?    Types: 3 Glasses of wine per week  ? Drug use: No  ?  ?  ?Family History ?No bleeding disorders, clotting disorders, autoimmune diseases, or aneurysms ?  ?     ?Allergies  ?Allergen Reactions  ? Statins    ?    Other reaction(s): Muscle Pain ?Causes legs to ache per pt  ?  ?  ?  ?REVIEW OF SYSTEMS (Negative unless checked) ?  ?Constitutional: '[]'$ Weight loss  '[]'$ Fever  '[]'$ Chills ?Cardiac: '[]'$ Chest pain   '[]'$ Chest pressure   '[]'$ Palpitations   '[]'$ Shortness of breath when laying flat    '[]'$ Shortness of breath at rest   '[x]'$ Shortness of breath with exertion. ?Vascular:  '[x]'$ Pain in legs with walking   '[x]'$ Pain in legs at rest   '[x]'$ Pain in legs when laying flat   '[]'$ Claudication   '[]'$ Pain in feet w

## 2021-07-18 NOTE — Assessment & Plan Note (Signed)
blood glucose control important in reducing the progression of atherosclerotic disease. Also, involved in wound healing. On appropriate medications.  

## 2021-07-18 NOTE — Assessment & Plan Note (Signed)
blood pressure control important in reducing the progression of atherosclerotic disease. On appropriate oral medications.  

## 2021-07-18 NOTE — Assessment & Plan Note (Signed)
ABIs today are 0.88 on the right and 1.05 on the left with biphasic waveforms and normal digital pressures and waveforms bilaterally.  Perfusion remains intact.  His swelling is markedly improved as well.  He is getting excellent local wound care at the wound care center.  Follow-up in 6 months with ABIs. ?

## 2021-07-21 ENCOUNTER — Encounter: Payer: Medicare Other | Admitting: Physician Assistant

## 2021-07-21 ENCOUNTER — Other Ambulatory Visit: Payer: Self-pay

## 2021-07-21 DIAGNOSIS — I110 Hypertensive heart disease with heart failure: Secondary | ICD-10-CM | POA: Diagnosis not present

## 2021-07-21 DIAGNOSIS — I70235 Atherosclerosis of native arteries of right leg with ulceration of other part of foot: Secondary | ICD-10-CM | POA: Diagnosis not present

## 2021-07-21 DIAGNOSIS — I5042 Chronic combined systolic (congestive) and diastolic (congestive) heart failure: Secondary | ICD-10-CM | POA: Diagnosis not present

## 2021-07-21 DIAGNOSIS — Z794 Long term (current) use of insulin: Secondary | ICD-10-CM | POA: Diagnosis not present

## 2021-07-21 DIAGNOSIS — I2510 Atherosclerotic heart disease of native coronary artery without angina pectoris: Secondary | ICD-10-CM | POA: Diagnosis not present

## 2021-07-21 DIAGNOSIS — L97818 Non-pressure chronic ulcer of other part of right lower leg with other specified severity: Secondary | ICD-10-CM | POA: Diagnosis not present

## 2021-07-21 DIAGNOSIS — Z89421 Acquired absence of other right toe(s): Secondary | ICD-10-CM | POA: Diagnosis not present

## 2021-07-21 DIAGNOSIS — L97422 Non-pressure chronic ulcer of left heel and midfoot with fat layer exposed: Secondary | ICD-10-CM | POA: Diagnosis not present

## 2021-07-21 DIAGNOSIS — I872 Venous insufficiency (chronic) (peripheral): Secondary | ICD-10-CM | POA: Diagnosis not present

## 2021-07-21 DIAGNOSIS — I70245 Atherosclerosis of native arteries of left leg with ulceration of other part of foot: Secondary | ICD-10-CM | POA: Diagnosis not present

## 2021-07-21 DIAGNOSIS — L97518 Non-pressure chronic ulcer of other part of right foot with other specified severity: Secondary | ICD-10-CM | POA: Diagnosis not present

## 2021-07-21 DIAGNOSIS — L89893 Pressure ulcer of other site, stage 3: Secondary | ICD-10-CM | POA: Diagnosis not present

## 2021-07-21 DIAGNOSIS — L97522 Non-pressure chronic ulcer of other part of left foot with fat layer exposed: Secondary | ICD-10-CM | POA: Diagnosis not present

## 2021-07-21 DIAGNOSIS — E11621 Type 2 diabetes mellitus with foot ulcer: Secondary | ICD-10-CM | POA: Diagnosis not present

## 2021-07-21 DIAGNOSIS — L89623 Pressure ulcer of left heel, stage 3: Secondary | ICD-10-CM | POA: Diagnosis not present

## 2021-07-21 DIAGNOSIS — E11622 Type 2 diabetes mellitus with other skin ulcer: Secondary | ICD-10-CM | POA: Diagnosis not present

## 2021-07-21 DIAGNOSIS — L97812 Non-pressure chronic ulcer of other part of right lower leg with fat layer exposed: Secondary | ICD-10-CM | POA: Diagnosis not present

## 2021-07-21 NOTE — Progress Notes (Signed)
Phariss, Jule A. ([id number]) ?Visit Report for 07/21/2021 ?Arrival Information Details ?Patient Name: [name], [name] ?Date of Service: 07/21/2021 1:45 PM ?Medical Record Number: [id number] ?Patient Account Number: [id number] ?Date of Birth/Sex: 11-03-1955 (66 y.o. M) ?Treating RN: Donnamarie Poag ?Primary Care Yunus Stoklosa: Fulton Reek Other Clinician: ?Referring Zander Ingham: Fulton Reek ?Treating Lavi Sheehan/Extender: Jeri Cos ?Weeks in Treatment: 8 ?Visit Information History Since Last Visit ?Added or deleted any medications: No ?Patient Arrived: Knee Scooter ?Had a fall or experienced change in No ?Arrival Time: 13:58 ?activities of daily living that may affect ?Accompanied By: wife ?risk of falls: ?Transfer Assistance: EasyPivot Patient Lift ?Hospitalized since last visit: No ?Patient Identification Verified: Yes ?Has Dressing in Place as Prescribed: Yes ?Secondary Verification Process Completed: Yes ?Pain Present Now: Yes ?Patient Requires Transmission-Based No ?Precautions: ?Patient Has Alerts: Yes ?Patient Alerts: Patient on Blood ?Thinner ?DIABETIC ?Eliquis/Aspirin 81 ?ABI at [facility] 06/14/21 ?L 0.88; R 0.87 ?Electronic Signature(s) ?Signed: 07/21/2021 2:33:48 PM By: Donnamarie Poag ?Entered ByDonnamarie Poag on 07/21/2021 14:01:02 ?Longhi, Jmari A. ([id number]) ?-------------------------------------------------------------------------------- ?Clinic Level of Care Assessment Details ?Patient Name: [name], [name] ?Date of Service: 07/21/2021 1:45 PM ?Medical Record Number: [id number] ?Patient Account Number: [id number] ?Date of Birth/Sex: 1955-11-12 (66 y.o. M) ?Treating RN: Donnamarie Poag ?Primary Care Helaine Yackel: Fulton Reek Other Clinician: ?Referring Alessandria Henken: Fulton Reek ?Treating Sallie Staron/Extender: Jeri Cos ?Weeks in Treatment: 8 ?Clinic Level of Care Assessment Items ?TOOL 4 Quantity Score ?'[]'$  - Use when only an EandM is performed on FOLLOW-UP visit 0 ?ASSESSMENTS - Nursing Assessment /  Reassessment ?'[]'$  - Reassessment of Co-morbidities (includes updates in patient status) 0 ?'[]'$  - 0 ?Reassessment of Adherence to Treatment Plan ?ASSESSMENTS - Wound and Skin Assessment / Reassessment ?'[]'$  - Simple Wound Assessment / Reassessment - one wound 0 ?X- 4 5 ?Complex Wound Assessment / Reassessment - multiple wounds ?'[]'$  - 0 ?Dermatologic / Skin Assessment (not related to wound area) ?ASSESSMENTS - Focused Assessment ?'[]'$  - Circumferential Edema Measurements - multi extremities 0 ?'[]'$  - 0 ?Nutritional Assessment / Counseling / Intervention ?'[]'$  - 0 ?Lower Extremity Assessment (monofilament, tuning fork, pulses) ?'[]'$  - 0 ?Peripheral Arterial Disease Assessment (using hand held doppler) ?ASSESSMENTS - Ostomy and/or Continence Assessment and Care ?'[]'$  - Incontinence Assessment and Management 0 ?'[]'$  - 0 ?Ostomy Care Assessment and Management (repouching, etc.) ?PROCESS - Coordination of Care ?X - Simple Patient / Family Education for ongoing care 1 15 ?'[]'$  - 0 ?Complex (extensive) Patient / Family Education for ongoing care ?'[]'$  - 0 ?Staff obtains Consents, Records, Test Results / Process Orders ?X- 1 10 ?Staff telephones HHA, Nursing Homes / Clarify orders / etc ?'[]'$  - 0 ?Routine Transfer to another Facility (non-emergent condition) ?'[]'$  - 0 ?Routine Hospital Admission (non-emergent condition) ?'[]'$  - 0 ?New Admissions / Biomedical engineer / Ordering NPWT, Apligraf, etc. ?'[]'$  - 0 ?Emergency Hospital Admission (emergent condition) ?X- 1 10 ?Simple Discharge Coordination ?'[]'$  - 0 ?Complex (extensive) Discharge Coordination ?PROCESS - Special Needs ?'[]'$  - Pediatric / Minor Patient Management 0 ?'[]'$  - 0 ?Isolation Patient Management ?'[]'$  - 0 ?Hearing / Language / Visual special needs ?'[]'$  - 0 ?Assessment of Community assistance (transportation, D/C planning, etc.) ?'[]'$  - 0 ?Additional assistance / Altered mentation ?'[]'$  - 0 ?Support Surface(s) Assessment (bed, cushion, seat, etc.) ?INTERVENTIONS - Wound Cleansing /  Measurement ?[name], [name] ([id number]) ?'[]'$  - 0 ?Simple Wound Cleansing - one wound ?X- 4 5 ?Complex Wound Cleansing - multiple wounds ?X- 1 5 ?Wound Imaging (photographs - any number of wounds) ?'[]'$  - 0 ?Wound Tracing (  instead of photographs) ?'[]'$  - 0 ?Simple Wound Measurement - one wound ?X- 4 5 ?Complex Wound Measurement - multiple wounds ?INTERVENTIONS - Wound Dressings ?X - Small Wound Dressing one or multiple wounds 4 10 ?'[]'$  - 0 ?Medium Wound Dressing one or multiple wounds ?'[]'$  - 0 ?Large Wound Dressing one or multiple wounds ?X- 1 5 ?Application of Medications - topical ?'[]'$  - 0 ?Application of Medications - injection ?INTERVENTIONS - Miscellaneous ?'[]'$  - External ear exam 0 ?'[]'$  - 0 ?Specimen Collection (cultures, biopsies, blood, body fluids, etc.) ?'[]'$  - 0 ?Specimen(s) / Culture(s) sent or taken to Lab for analysis ?'[]'$  - 0 ?Patient Transfer (multiple staff / Civil Service fast streamer / Similar devices) ?'[]'$  - 0 ?Simple Staple / Suture removal (25 or less) ?'[]'$  - 0 ?Complex Staple / Suture removal (26 or more) ?'[]'$  - 0 ?Hypo / Hyperglycemic Management (close monitor of Blood Glucose) ?'[]'$  - 0 ?Ankle / Brachial Index (ABI) - do not check if billed separately ?X- 1 5 ?Vital Signs ?Has the patient been seen at the hospital within the last three years: Yes ?Total Score: 150 ?Level Of Care: New/Established - Level ?4 ?Electronic Signature(s) ?Signed: 07/21/2021 4:04:30 PM By: Donnamarie Poag ?Entered ByDonnamarie Poag on 07/21/2021 14:42:08 ?[name], [name] ([id number]) ?-------------------------------------------------------------------------------- ?Encounter Discharge Information Details ?Patient Name: [name], [name] ?Date of Service: 07/21/2021 1:45 PM ?Medical Record Number: [id number] ?Patient Account Number: [id number] ?Date of Birth/Sex: 05-29-1955 (66 y.o. M) ?Treating RN: Donnamarie Poag ?Primary Care Jhase Creppel: Fulton Reek Other Clinician: ?Referring Rahsaan Weakland: Fulton Reek ?Treating Aunesti Pellegrino/Extender: Jeri Cos ?Weeks  in Treatment: 8 ?Encounter Discharge Information Items ?Discharge Condition: Stable ?Ambulatory Status: Knee Scooter ?Discharge Destination: Home Health ?Telephoned: No ?Orders Sent: Yes ?Transportation: Private Auto ?Accompanied By: wife ?Schedule Follow-up Appointment: Yes ?Clinical Summary of Care: ?Electronic Signature(s) ?Signed: 07/21/2021 4:04:30 PM By: Donnamarie Poag ?Entered ByDonnamarie Poag on 07/21/2021 14:43:01 ?[name], [name] ([id number]) ?-------------------------------------------------------------------------------- ?Lower Extremity Assessment Details ?Patient Name: [name], [name] ?Date of Service: 07/21/2021 1:45 PM ?Medical Record Number: [id number] ?Patient Account Number: [id number] ?Date of Birth/Sex: 1956-03-29 (66 y.o. M) ?Treating RN: Donnamarie Poag ?Primary Care Aracelys Glade: Fulton Reek Other Clinician: ?Referring Matai Carpenito: Fulton Reek ?Treating Garey Alleva/Extender: Jeri Cos ?Weeks in Treatment: 8 ?Edema Assessment ?Assessed: [Left: Yes] [Right: Yes] ?Edema: [Left: Yes] [Right: Yes] ?Calf ?Left: Right: ?Point of Measurement: 36 cm From Medial Instep 47.5 cm 47 cm ?Ankle ?Left: Right: ?Point of Measurement: 12 cm From Medial Instep 30 cm 28 cm ?Electronic Signature(s) ?Signed: 07/21/2021 2:33:48 PM By: Donnamarie Poag ?Entered ByDonnamarie Poag on 07/21/2021 14:16:07 ?[name], [name] ([id number]) ?-------------------------------------------------------------------------------- ?Multi Wound Chart Details ?Patient Name: [name], [name] ?Date of Service: 07/21/2021 1:45 PM ?Medical Record Number: [id number] ?Patient Account Number: [id number] ?Date of Birth/Sex: 1956-03-05 (66 y.o. M) ?Treating RN: Donnamarie Poag ?Primary Care Jaykub Mackins: Fulton Reek Other Clinician: ?Referring Amauris Debois: Fulton Reek ?Treating Christella App/Extender: Jeri Cos ?Weeks in Treatment: 8 ?Vital Signs ?Height(in): 73 ?Pulse(bpm): 114 ?Weight(lbs): 371 ?Blood Pressure(mmHg): 123/77 ?Body Mass Index(BMI):  48.9 ?Temperature(??F): 98.5 ?Respiratory Rate(breaths/min): 16 ?Photos: ?Wound Location: Right Lower Leg Right, Proximal Lower Leg Left Calcaneus ?Wounding Event: Gradually Appeared Gradually Appeared Gradually Appeared ?Primary Etiology: [place]

## 2021-07-22 NOTE — Progress Notes (Signed)
Dicesare, Deaglan A. ([id number]) ?Visit Report for 07/21/2021 ?Chief Complaint Document Details ?Patient Name: [name], [name] ?Date of Service: 07/21/2021 1:45 PM ?Medical Record Number: [id number] ?Patient Account Number: [id number] ?Date of Birth/Sex: 01/14/1956 (66 y.o. M) ?Treating RN: Donnamarie Poag ?Primary Care Provider: Fulton Reek Other Clinician: ?Referring Provider: Fulton Reek ?Treating Provider/Extender: Jeri Cos ?Weeks in Treatment: 8 ?Information Obtained from: Patient ?Chief Complaint ?Bilateral LE Ulcers ?Electronic Signature(s) ?Signed: 07/21/2021 3:07:33 PM By: Worthy Keeler PA-C ?Entered By: Worthy Keeler on 07/21/2021 15:07:33 ?[name], [name] ([id number]) ?-------------------------------------------------------------------------------- ?HPI Details ?Patient Name: [name], [name] ?Date of Service: 07/21/2021 1:45 PM ?Medical Record Number: [id number] ?Patient Account Number: [id number] ?Date of Birth/Sex: 19-May-1955 (66 y.o. M) ?Treating RN: Donnamarie Poag ?Primary Care Provider: Fulton Reek Other Clinician: ?Referring Provider: Fulton Reek ?Treating Provider/Extender: Jeri Cos ?Weeks in Treatment: 8 ?History of Present Illness ?HPI Description: Admission 8/31 ?[name] is a 66 year old male with a past medical history of uncontrolled insulin-dependent type 2 diabetes, CAD, osteomyelitis to the ?left third toe status post partial third ray amputation, and peripheral arterial disease that presents for a left plantar foot wound and bilateral lower ?extremity wounds. The left plantar foot wound has been present for at least 7 months. He has been following podiatry for this issue. He uses silver ?collagen to this area. He reports that in the past 1 to 2 months he has developed multiple scattered open wounds to his lower extremities ?bilaterally that have worsened over time. He states that they often develop as either blisters or he hits his leg against an object.He  is currently ?using Betadine to these areas. He denies signs of infection. He has compression stockings however he cannot put these on very easily. Also he is ?on his feet most of the day. He states he does not have anyone to help him do the activities he needs to get done. ?Readmission: ?05/26/2021 upon evaluation today patient presents for reevaluation here in the clinic concerning issues he is been having with his bilateral lower ?extremities, his right great toe, and left heel primarily. With that being said he has been in the hospital recently and his hemoglobin A1c on ?January 10 was 9.2. With that being said he does have a lot of really thickened dry skin attached to his legs when he try to remove that today. He ?is in agreement with the plan. Nonetheless I do believe that based on what we are seeing he is been having a lot of issues with his congestive ?heart failure I do have him on fluid pills he does go to the bathroom quite a bit. Subsequently has been in [employer] but I do not think that ?is doing quite enough to help control his edema. He also needs something better than Santyl to put on the heel right now when he was in the ?hospital they actually cut a space open in the compression around his heel so they can open it up and actually apply the Santyl daily. I think he ?needs something better than that. ?Again the patient does have a history of diabetes mellitus type 2, chronic venous insufficiency, hypertension, and congestive heart failure. He also ?has signs of stage III lymphedema as well. ?06/02/2021 upon evaluation today patient appears to be doing decently well in regard to his wounds. Fortunately I do not see any signs of active ?infection at this time which is great news. Fortunately I think that he is making excellent progress. Nonetheless I do think  that we seem to be ?making some progress here. ?06/09/2021 upon evaluation today patient appears to be doing well with regard to his legs all  things considered unfortunately the left heel is not ?doing nearly as well as what I like to see. There is a lot of necrotic tissue this is very boggy. I think that it is going to require potentially much ?more debridement but again we really need to get the results of the arterial testing done and this is actually scheduled for the 15th. ?2/17; arrives in clinic today with the left plantar heel wound completely necrotic and malodorous. Comparing the picture with last week this looks ?quite a bit worse. He has been using Iodoflex under compression. He has an area on the right first toe which seems clean and better and a new ?area on the right anterior mid Tibia. ?I was unable to find the results of his arterial studies that he had done on 1/15. Presumably they have not been read. I can see that they have ?been done ?06/23/2021 upon evaluation today patient appears to be doing well currently in regard to his legs. I am actually very pleased in that regard. ?Fortunately I do not see any signs of active infection at this time. I have reviewed his arterial study and it appeared to be pretty good with TBI's ?on the left of 0.88 on the right 0.87. Overall I think that we are definitely headed in the right direction which is great news. Fortunately I do not ?see any signs of active infection locally or systemically at this time. ?06/30/2021 upon evaluation today patient appears to be doing well with regard to the right lower extremity ulcers with very pleased in this regard. I ?do feel like the Iodoflex has done well here. In regard to the left heel location this is something that I am actually a little bit more concerned about ?I really think that switching over to the Dakin's moistened gauze dressing may be the best way to go at this point based on what we are seeing. ?We have been using Iodoflex is just not clearing up quite as well as I would like to see and I think this may be a better method at which to get ?things  under better control. The good news is he does have his Velcro compression wrap so we can try that at this point as well. ?3/10; patient has chronic venous insufficiency with a wound on the right anterior mid tibia, dorsal right first toe a large plantar left heel wound. ?We are using Dakin's on the left heel and Iodosorb on the right anterior lower extremity and the right first toe. The areas on the right are ?apparently a lot better. The left heel is certainly a lot better than I remember seeing this the last time 3 weeks ago. He is using a scooter to ?offload the left heel ?07/14/2021 upon evaluation today patient appears to be doing well with regard to his wounds in general. The heel is showing signs of improvement ?which is great on the left. On the right side leg this appears to be almost completely healed. The biggest issue is he has a lot of the dry skin ?building up I do believe that he is going to need to switch over to the Velcro wraps so he can put lotion on these areas to try to keep it from ?building up as much he will also be able to wash the leg which will be beneficial as  well in my opinion. We discussed that in greater detail today. ?07/21/2021 on inspection today patient's left heel is actually showing signs of excellent improvement which is great news overall very pleased in ?that regard. I do not see any evidence of active infection locally nor systemically which is great news as well. ?Electronic Signature(s) ?Signed: 07/21/2021 4:57:01 PM By: Worthy Keeler PA-C ?Entered By: Worthy Keeler on 07/21/2021 16:57:01 ?[name], [name] ([id number]) ?[name], [name] ([id number]) ?-------------------------------------------------------------------------------- ?Physical Exam Details ?Patient Name: [name], [name] ?Date of Service: 07/21/2021 1:45 PM ?Medical Record Number: [id number] ?Patient Account Number: [id number] ?Date of Birth/Sex: 1956-03-04 (66 y.o. M) ?Treating RN: Donnamarie Poag ?Primary  Care Provider: Fulton Reek Other Clinician: ?Referring Provider: Fulton Reek ?Treating Provider/Extender: Jeri Cos ?Weeks in Treatment: 8 ?Constitutional ?Well-nourished and well-hydrated in no

## 2021-07-26 DIAGNOSIS — I5032 Chronic diastolic (congestive) heart failure: Secondary | ICD-10-CM | POA: Diagnosis not present

## 2021-07-26 DIAGNOSIS — I890 Lymphedema, not elsewhere classified: Secondary | ICD-10-CM | POA: Diagnosis not present

## 2021-07-26 DIAGNOSIS — I110 Hypertensive heart disease with heart failure: Secondary | ICD-10-CM | POA: Diagnosis not present

## 2021-07-26 DIAGNOSIS — I872 Venous insufficiency (chronic) (peripheral): Secondary | ICD-10-CM | POA: Diagnosis not present

## 2021-07-26 DIAGNOSIS — E1151 Type 2 diabetes mellitus with diabetic peripheral angiopathy without gangrene: Secondary | ICD-10-CM | POA: Diagnosis not present

## 2021-07-26 DIAGNOSIS — I5023 Acute on chronic systolic (congestive) heart failure: Secondary | ICD-10-CM | POA: Diagnosis not present

## 2021-07-26 DIAGNOSIS — I2510 Atherosclerotic heart disease of native coronary artery without angina pectoris: Secondary | ICD-10-CM | POA: Diagnosis not present

## 2021-07-26 DIAGNOSIS — L89893 Pressure ulcer of other site, stage 3: Secondary | ICD-10-CM | POA: Diagnosis not present

## 2021-07-26 DIAGNOSIS — L89623 Pressure ulcer of left heel, stage 3: Secondary | ICD-10-CM | POA: Diagnosis not present

## 2021-07-26 DIAGNOSIS — L97522 Non-pressure chronic ulcer of other part of left foot with fat layer exposed: Secondary | ICD-10-CM | POA: Diagnosis not present

## 2021-07-28 ENCOUNTER — Encounter: Payer: Medicare Other | Admitting: Physician Assistant

## 2021-07-28 DIAGNOSIS — L89623 Pressure ulcer of left heel, stage 3: Secondary | ICD-10-CM | POA: Diagnosis not present

## 2021-07-28 DIAGNOSIS — I70235 Atherosclerosis of native arteries of right leg with ulceration of other part of foot: Secondary | ICD-10-CM | POA: Diagnosis not present

## 2021-07-28 DIAGNOSIS — Z794 Long term (current) use of insulin: Secondary | ICD-10-CM | POA: Diagnosis not present

## 2021-07-28 DIAGNOSIS — L97818 Non-pressure chronic ulcer of other part of right lower leg with other specified severity: Secondary | ICD-10-CM | POA: Diagnosis not present

## 2021-07-28 DIAGNOSIS — E11622 Type 2 diabetes mellitus with other skin ulcer: Secondary | ICD-10-CM | POA: Diagnosis not present

## 2021-07-28 DIAGNOSIS — Z89421 Acquired absence of other right toe(s): Secondary | ICD-10-CM | POA: Diagnosis not present

## 2021-07-28 DIAGNOSIS — I70245 Atherosclerosis of native arteries of left leg with ulceration of other part of foot: Secondary | ICD-10-CM | POA: Diagnosis not present

## 2021-07-28 DIAGNOSIS — I5042 Chronic combined systolic (congestive) and diastolic (congestive) heart failure: Secondary | ICD-10-CM | POA: Diagnosis not present

## 2021-07-28 DIAGNOSIS — I2510 Atherosclerotic heart disease of native coronary artery without angina pectoris: Secondary | ICD-10-CM | POA: Diagnosis not present

## 2021-07-28 DIAGNOSIS — I110 Hypertensive heart disease with heart failure: Secondary | ICD-10-CM | POA: Diagnosis not present

## 2021-07-28 DIAGNOSIS — I10 Essential (primary) hypertension: Secondary | ICD-10-CM | POA: Diagnosis not present

## 2021-07-28 DIAGNOSIS — L89893 Pressure ulcer of other site, stage 3: Secondary | ICD-10-CM | POA: Diagnosis not present

## 2021-07-28 DIAGNOSIS — L97422 Non-pressure chronic ulcer of left heel and midfoot with fat layer exposed: Secondary | ICD-10-CM | POA: Diagnosis not present

## 2021-07-28 DIAGNOSIS — E11621 Type 2 diabetes mellitus with foot ulcer: Secondary | ICD-10-CM | POA: Diagnosis not present

## 2021-07-28 DIAGNOSIS — I872 Venous insufficiency (chronic) (peripheral): Secondary | ICD-10-CM | POA: Diagnosis not present

## 2021-07-28 DIAGNOSIS — L97812 Non-pressure chronic ulcer of other part of right lower leg with fat layer exposed: Secondary | ICD-10-CM | POA: Diagnosis not present

## 2021-07-28 DIAGNOSIS — L97522 Non-pressure chronic ulcer of other part of left foot with fat layer exposed: Secondary | ICD-10-CM | POA: Diagnosis not present

## 2021-07-28 NOTE — Progress Notes (Addendum)
Stepter, [name]. ([id number]) ?Visit Report for 07/28/2021 ?Chief Complaint Document Details ?Patient Name: [name], [name]. ?Date of Service: 07/28/2021 1:45 PM ?Medical Record Number: [id number] ?Patient Account Number: [id number] ?Date of Birth/Sex: 04-01-1956 (66 y.o. M) ?Treating RN: Donnamarie Poag ?Primary Care Provider: Fulton Reek Other Clinician: ?Referring Provider: Fulton Reek ?Treating Provider/Extender: Jeri Cos ?Weeks in Treatment: 9 ?Information Obtained from: Patient ?Chief Complaint ?Bilateral LE Ulcers ?Electronic Signature(s) ?Signed: 07/28/2021 2:24:36 PM By: Worthy Keeler PA-C ?Entered By: Worthy Keeler on 07/28/2021 14:24:36 ?Lunsford, Eljay [name]. ([id number]) ?-------------------------------------------------------------------------------- ?HPI Details ?Patient Name: Alec, [name]. ?Date of Service: 07/28/2021 1:45 PM ?Medical Record Number: [id number] ?Patient Account Number: [id number] ?Date of Birth/Sex: [birth year] (66 y.o. M) ?Treating RN: Donnamarie Poag ?Primary Care Provider: Fulton Reek Other Clinician: ?Referring Provider: Fulton Reek ?Treating Provider/Extender: Jeri Cos ?Weeks in Treatment: 9 ?History of Present Illness ?HPI Description: Admission 8/31 ?[name] is [name] 66 year old male with [name] past medical history of uncontrolled insulin-dependent type 2 diabetes, CAD, osteomyelitis to the ?left third toe status post partial third ray amputation, and peripheral arterial disease that presents for [name] left plantar foot wound and bilateral lower ?extremity wounds. The left plantar foot wound has been present for at least 7 months. He has been following podiatry for this issue. He uses silver ?collagen to this area. He reports that in the past 1 to 2 months he has developed multiple scattered open wounds to his lower extremities ?bilaterally that have worsened over time. He states that they often develop as either blisters or he hits his leg against an object.He  is currently ?using Betadine to these areas. He denies signs of infection. He has compression stockings however he cannot put these on very easily. Also he is ?on his feet most of the day. He states he does not have anyone to help him do the activities he needs to get done. ?Readmission: ?05/26/2021 upon evaluation today patient presents for reevaluation here in the clinic concerning issues he is been having with his bilateral lower ?extremities, his right great toe, and left heel primarily. With that being said he has been in the hospital recently and his hemoglobin A1c on ?January 10 was 9.2. With that being said he does have [name] lot of really thickened dry skin attached to his legs when he try to remove that today. He ?is in agreement with the plan. Nonetheless I do believe that based on what we are seeing he is been having [name] lot of issues with his congestive ?heart failure I do have him on fluid pills he does go to the bathroom quite [name] bit. Subsequently has been in [employer] but I do not think that ?is doing quite enough to help control his edema. He also needs something better than Santyl to put on the heel right now when he was in the ?hospital they actually cut [name] space open in the compression around his heel so they can open it up and actually apply the Santyl daily. I think he ?needs something better than that. ?Again the patient does have [name] history of diabetes mellitus type 2, chronic venous insufficiency, hypertension, and congestive heart failure. He also ?has signs of stage III lymphedema as well. ?06/02/2021 upon evaluation today patient appears to be doing decently well in regard to his wounds. Fortunately I do not see any signs of active ?infection at this time which is great news. Fortunately I think that he is making excellent progress. Nonetheless I do think  that we seem to be ?making some progress here. ?06/09/2021 upon evaluation today patient appears to be doing well with regard to his legs all  things considered unfortunately the left heel is not ?doing nearly as well as what I like to see. There is [name] lot of necrotic tissue this is very boggy. I think that it is going to require potentially much ?more debridement but again we really need to get the results of the arterial testing done and this is actually scheduled for the 15th. ?2/17; arrives in clinic today with the left plantar heel wound completely necrotic and malodorous. Comparing the picture with last week this looks ?quite [name] bit worse. He has been using Iodoflex under compression. He has an area on the right first toe which seems clean and better and [name] new ?area on the right anterior mid Tibia. ?I was unable to find the results of his arterial studies that he had done on 1/15. Presumably they have not been read. I can see that they have ?been done ?06/23/2021 upon evaluation today patient appears to be doing well currently in regard to his legs. I am actually very pleased in that regard. ?Fortunately I do not see any signs of active infection at this time. I have reviewed his arterial study and it appeared to be pretty good with TBI's ?on the left of 0.88 on the right 0.87. Overall I think that we are definitely headed in the right direction which is great news. Fortunately I do not ?see any signs of active infection locally or systemically at this time. ?06/30/2021 upon evaluation today patient appears to be doing well with regard to the right lower extremity ulcers with very pleased in this regard. I ?do feel like the Iodoflex has done well here. In regard to the left heel location this is something that I am actually [name] little bit more concerned about ?I really think that switching over to the Dakin's moistened gauze dressing may be the best way to go at this point based on what we are seeing. ?We have been using Iodoflex is just not clearing up quite as well as I would like to see and I think this may be [name] better method at which to get ?things  under better control. The good news is he does have his Velcro compression wrap so we can try that at this point as well. ?3/10; patient has chronic venous insufficiency with [name] wound on the right anterior mid tibia, dorsal right first toe [name] large plantar left heel wound. ?We are using Dakin's on the left heel and Iodosorb on the right anterior lower extremity and the right first toe. The areas on the right are ?apparently [name] lot better. The left heel is certainly [name] lot better than I remember seeing this the last time 3 weeks ago. He is using [name] scooter to ?offload the left heel ?07/14/2021 upon evaluation today patient appears to be doing well with regard to his wounds in general. The heel is showing signs of improvement ?which is great on the left. On the right side leg this appears to be almost completely healed. The biggest issue is he has [name] lot of the dry skin ?building up I do believe that he is going to need to switch over to the Velcro wraps so he can put lotion on these areas to try to keep it from ?building up as much he will also be able to wash the leg which will be beneficial as  well in my opinion. We discussed that in greater detail today. ?07/21/2021 on inspection today patient's left heel is actually showing signs of excellent improvement which is great news overall very pleased in ?that regard. I do not see any evidence of active infection locally nor systemically which is great news as well. ?07/28/2021 upon evaluation today patient appears to be doing better in all wound locations except for his left heel. He has been up on it more in ?the past week and it shows. He does have some deep tissue injury as well as some additional breakdown unfortunately. I think that he needs to ?be very careful with this because if he is not is going to risk opening up into [name] much deeper wound which is definitely not what we want to see. ?Nicholes, Najee [name]. ([id number]) ?Electronic Signature(s) ?Signed: 07/28/2021 2:50:19  PM By: Worthy Keeler PA-C ?Entered By: Worthy Keeler on 07/28/2021 14:50:18 ?Schoenherr, Jovontae [name]. ([id number]) ?-------------------------------------------------------------------------------- ?Physical E

## 2021-07-28 NOTE — Progress Notes (Signed)
Mumpower, Theadore A. ([id number]) ?Visit Report for 07/28/2021 ?Arrival Information Details ?Patient Name: [name], [name] ?Date of Service: 07/28/2021 1:45 PM ?Medical Record Number: [id number] ?Patient Account Number: [id number] ?Date of Birth/Sex: 1956-03-28 (66 y.o. M) ?Treating RN: Donnamarie Poag ?Primary Care Yuleimy Kretz: Fulton Reek Other Clinician: ?Referring Ronesha Heenan: Fulton Reek ?Treating Maleeka Sabatino/Extender: Jeri Cos ?Weeks in Treatment: 9 ?Visit Information History Since Last Visit ?Added or deleted any medications: No ?Patient Arrived: Knee Scooter ?Had a fall or experienced change in No ?Arrival Time: 13:48 ?activities of daily living that may affect ?Accompanied By: wife ?risk of falls: ?Transfer Assistance: EasyPivot Patient Lift ?Hospitalized since last visit: No ?Patient Identification Verified: Yes ?Has Dressing in Place as Prescribed: Yes ?Secondary Verification Process Completed: Yes ?Has Compression in Place as Prescribed: Yes ?Patient Requires Transmission-Based No ?Has Footwear/Offloading in Place as Prescribed: Yes ?Precautions: ?Left: Wedge Shoe ?Patient Has Alerts: Yes ?Right: Surgical Shoe with Pressure Relief ?Patient Alerts: Patient on Blood ?Insole ?Thinner ?Pain Present Now: No ?DIABETIC ?Eliquis/Aspirin 81 ?ABI at [facility] 06/14/21 ?L 0.88; R 0.87 ?Electronic Signature(s) ?Signed: 07/28/2021 2:50:48 PM By: Donnamarie Poag ?Entered ByDonnamarie Poag on 07/28/2021 13:51:58 ?Grunden, Evian A. ([id number]) ?-------------------------------------------------------------------------------- ?Clinic Level of Care Assessment Details ?Patient Name: [name], [name] ?Date of Service: 07/28/2021 1:45 PM ?Medical Record Number: [id number] ?Patient Account Number: [id number] ?Date of Birth/Sex: 1955-05-13 (66 y.o. M) ?Treating RN: Donnamarie Poag ?Primary Care Negar Sieler: Fulton Reek Other Clinician: ?Referring Valda Christenson: Fulton Reek ?Treating Elenor Wildes/Extender: Jeri Cos ?Weeks in Treatment: 9 ?Clinic  Level of Care Assessment Items ?TOOL 4 Quantity Score ?'[]'$  - Use when only an EandM is performed on FOLLOW-UP visit 0 ?ASSESSMENTS - Nursing Assessment / Reassessment ?'[]'$  - Reassessment of Co-morbidities (includes updates in patient status) 0 ?X- 1 5 ?Reassessment of Adherence to Treatment Plan ?ASSESSMENTS - Wound and Skin Assessment / Reassessment ?'[]'$  - Simple Wound Assessment / Reassessment - one wound 0 ?X- 4 5 ?Complex Wound Assessment / Reassessment - multiple wounds ?'[]'$  - 0 ?Dermatologic / Skin Assessment (not related to wound area) ?ASSESSMENTS - Focused Assessment ?'[]'$  - Circumferential Edema Measurements - multi extremities 0 ?'[]'$  - 0 ?Nutritional Assessment / Counseling / Intervention ?'[]'$  - 0 ?Lower Extremity Assessment (monofilament, tuning fork, pulses) ?'[]'$  - 0 ?Peripheral Arterial Disease Assessment (using hand held doppler) ?ASSESSMENTS - Ostomy and/or Continence Assessment and Care ?'[]'$  - Incontinence Assessment and Management 0 ?'[]'$  - 0 ?Ostomy Care Assessment and Management (repouching, etc.) ?PROCESS - Coordination of Care ?X - Simple Patient / Family Education for ongoing care 1 15 ?'[]'$  - 0 ?Complex (extensive) Patient / Family Education for ongoing care ?'[]'$  - 0 ?Staff obtains Consents, Records, Test Results / Process Orders ?X- 1 10 ?Staff telephones HHA, Nursing Homes / Clarify orders / etc ?'[]'$  - 0 ?Routine Transfer to another Facility (non-emergent condition) ?'[]'$  - 0 ?Routine Hospital Admission (non-emergent condition) ?'[]'$  - 0 ?New Admissions / Biomedical engineer / Ordering NPWT, Apligraf, etc. ?'[]'$  - 0 ?Emergency Hospital Admission (emergent condition) ?X- 1 10 ?Simple Discharge Coordination ?'[]'$  - 0 ?Complex (extensive) Discharge Coordination ?PROCESS - Special Needs ?'[]'$  - Pediatric / Minor Patient Management 0 ?'[]'$  - 0 ?Isolation Patient Management ?'[]'$  - 0 ?Hearing / Language / Visual special needs ?'[]'$  - 0 ?Assessment of Community assistance (transportation, D/C planning, etc.) ?'[]'$  -  0 ?Additional assistance / Altered mentation ?'[]'$  - 0 ?Support Surface(s) Assessment (bed, cushion, seat, etc.) ?INTERVENTIONS - Wound Cleansing / Measurement ?[name], [name] ([id number]) ?'[]'$  - 0 ?Simple Wound Cleansing - one wound ?X-  4 5 ?Complex Wound Cleansing - multiple wounds ?X- 1 5 ?Wound Imaging (photographs - any number of wounds) ?'[]'$  - 0 ?Wound Tracing (instead of photographs) ?'[]'$  - 0 ?Simple Wound Measurement - one wound ?X- 4 5 ?Complex Wound Measurement - multiple wounds ?INTERVENTIONS - Wound Dressings ?X - Small Wound Dressing one or multiple wounds 4 10 ?'[]'$  - 0 ?Medium Wound Dressing one or multiple wounds ?'[]'$  - 0 ?Large Wound Dressing one or multiple wounds ?X- 1 5 ?Application of Medications - topical ?'[]'$  - 0 ?Application of Medications - injection ?INTERVENTIONS - Miscellaneous ?'[]'$  - External ear exam 0 ?'[]'$  - 0 ?Specimen Collection (cultures, biopsies, blood, body fluids, etc.) ?'[]'$  - 0 ?Specimen(s) / Culture(s) sent or taken to Lab for analysis ?'[]'$  - 0 ?Patient Transfer (multiple staff / Civil Service fast streamer / Similar devices) ?'[]'$  - 0 ?Simple Staple / Suture removal (25 or less) ?'[]'$  - 0 ?Complex Staple / Suture removal (26 or more) ?'[]'$  - 0 ?Hypo / Hyperglycemic Management (close monitor of Blood Glucose) ?'[]'$  - 0 ?Ankle / Brachial Index (ABI) - do not check if billed separately ?X- 1 5 ?Vital Signs ?Has the patient been seen at the hospital within the last three years: Yes ?Total Score: 155 ?Level Of Care: New/Established - Level ?4 ?Electronic Signature(s) ?Signed: 07/28/2021 2:50:48 PM By: Donnamarie Poag ?Entered ByDonnamarie Poag on 07/28/2021 14:34:20 ?[name], [name] ([id number]) ?-------------------------------------------------------------------------------- ?Encounter Discharge Information Details ?Patient Name: [name], [name] ?Date of Service: 07/28/2021 1:45 PM ?Medical Record Number: [id number] ?Patient Account Number: [id number] ?Date of Birth/Sex: [birth year] (66 y.o. M) ?Treating RN: Donnamarie Poag ?Primary Care Josie Burleigh: Fulton Reek Other Clinician: ?Referring Duane Trias: Fulton Reek ?Treating Dawana Asper/Extender: Jeri Cos ?Weeks in Treatment: 9 ?Encounter Discharge Information Items ?Discharge Condition: Stable ?Ambulatory Status: Knee Scooter ?Discharge Destination: Home Health ?Telephoned: No ?Orders Sent: Yes ?Transportation: Private Auto ?Accompanied By: wife ?Schedule Follow-up Appointment: Yes ?Clinical Summary of Care: ?Electronic Signature(s) ?Signed: 07/28/2021 2:50:48 PM By: Donnamarie Poag ?Entered ByDonnamarie Poag on 07/28/2021 14:36:34 ?[name], [name] ([id number]) ?-------------------------------------------------------------------------------- ?Lower Extremity Assessment Details ?Patient Name: [name], [name] ?Date of Service: 07/28/2021 1:45 PM ?Medical Record Number: [id number] ?Patient Account Number: [id number] ?Date of Birth/Sex: November 22, 1955 (66 y.o. M) ?Treating RN: Donnamarie Poag ?Primary Care Moustafa Mossa: Fulton Reek Other Clinician: ?Referring Rontrell Moquin: Fulton Reek ?Treating Estephany Perot/Extender: Jeri Cos ?Weeks in Treatment: 9 ?Edema Assessment ?Assessed: [Left: Yes] [Right: Yes] ?Edema: [Left: Yes] [Right: Yes] ?Calf ?Left: Right: ?Point of Measurement: 36 cm From Medial Instep 50 cm 47 cm ?Ankle ?Left: Right: ?Point of Measurement: 12 cm From Medial Instep 29.5 cm 27.5 cm ?Electronic Signature(s) ?Signed: 07/28/2021 2:50:48 PM By: Donnamarie Poag ?Entered ByDonnamarie Poag on 07/28/2021 14:16:28 ?[name], [name] ([id number]) ?-------------------------------------------------------------------------------- ?Multi Wound Chart Details ?Patient Name: [name], [name] ?Date of Service: 07/28/2021 1:45 PM ?Medical Record Number: [id number] ?Patient Account Number: [id number] ?Date of Birth/Sex: 02/08/1956 (66 y.o. M) ?Treating RN: Donnamarie Poag ?Primary Care Zareen Jamison: Fulton Reek Other Clinician: ?Referring Solange Emry: Fulton Reek ?Treating Rj Pedrosa/Extender: Jeri Cos ?Weeks  in Treatment: 9 ?Vital Signs ?Height(in): 73 ?Pulse(bpm): 118 ?Weight(lbs): 371 ?Blood Pressure(mmHg): 138/88 ?Body Mass Index(BMI): 48.9 ?Temperature(??F): 97.7 ?Respiratory Rate(breaths/min): 18 ?Photos: ?Wou

## 2021-08-04 ENCOUNTER — Inpatient Hospital Stay
Admission: EM | Admit: 2021-08-04 | Discharge: 2021-08-11 | DRG: 629 | Disposition: A | Discharge status: Skilled Nursing Facility | Payer: Medicare Other | Source: Other Acute Inpatient Hospital | Attending: Internal Medicine | Admitting: Internal Medicine

## 2021-08-04 ENCOUNTER — Emergency Department: Payer: Medicare Other

## 2021-08-04 ENCOUNTER — Observation Stay: Payer: Medicare Other

## 2021-08-04 ENCOUNTER — Encounter: Payer: Medicare Other | Attending: Physician Assistant | Admitting: Physician Assistant

## 2021-08-04 ENCOUNTER — Other Ambulatory Visit: Payer: Self-pay

## 2021-08-04 DIAGNOSIS — F17290 Nicotine dependence, other tobacco product, uncomplicated: Secondary | ICD-10-CM | POA: Diagnosis present

## 2021-08-04 DIAGNOSIS — L97424 Non-pressure chronic ulcer of left heel and midfoot with necrosis of bone: Secondary | ICD-10-CM | POA: Diagnosis not present

## 2021-08-04 DIAGNOSIS — N400 Enlarged prostate without lower urinary tract symptoms: Secondary | ICD-10-CM | POA: Diagnosis present

## 2021-08-04 DIAGNOSIS — I517 Cardiomegaly: Secondary | ICD-10-CM | POA: Diagnosis not present

## 2021-08-04 DIAGNOSIS — Z794 Long term (current) use of insulin: Secondary | ICD-10-CM | POA: Insufficient documentation

## 2021-08-04 DIAGNOSIS — R41841 Cognitive communication deficit: Secondary | ICD-10-CM | POA: Diagnosis not present

## 2021-08-04 DIAGNOSIS — Z955 Presence of coronary angioplasty implant and graft: Secondary | ICD-10-CM

## 2021-08-04 DIAGNOSIS — I739 Peripheral vascular disease, unspecified: Secondary | ICD-10-CM | POA: Diagnosis not present

## 2021-08-04 DIAGNOSIS — R600 Localized edema: Secondary | ICD-10-CM

## 2021-08-04 DIAGNOSIS — E1152 Type 2 diabetes mellitus with diabetic peripheral angiopathy with gangrene: Secondary | ICD-10-CM | POA: Diagnosis not present

## 2021-08-04 DIAGNOSIS — I890 Lymphedema, not elsewhere classified: Secondary | ICD-10-CM | POA: Diagnosis present

## 2021-08-04 DIAGNOSIS — B964 Proteus (mirabilis) (morganii) as the cause of diseases classified elsewhere: Secondary | ICD-10-CM | POA: Diagnosis present

## 2021-08-04 DIAGNOSIS — L97812 Non-pressure chronic ulcer of other part of right lower leg with fat layer exposed: Secondary | ICD-10-CM | POA: Diagnosis not present

## 2021-08-04 DIAGNOSIS — J811 Chronic pulmonary edema: Secondary | ICD-10-CM | POA: Diagnosis not present

## 2021-08-04 DIAGNOSIS — E569 Vitamin deficiency, unspecified: Secondary | ICD-10-CM | POA: Diagnosis not present

## 2021-08-04 DIAGNOSIS — L97509 Non-pressure chronic ulcer of other part of unspecified foot with unspecified severity: Secondary | ICD-10-CM | POA: Diagnosis not present

## 2021-08-04 DIAGNOSIS — G4730 Sleep apnea, unspecified: Secondary | ICD-10-CM

## 2021-08-04 DIAGNOSIS — M86172 Other acute osteomyelitis, left ankle and foot: Secondary | ICD-10-CM | POA: Diagnosis not present

## 2021-08-04 DIAGNOSIS — I10 Essential (primary) hypertension: Secondary | ICD-10-CM | POA: Diagnosis present

## 2021-08-04 DIAGNOSIS — E11621 Type 2 diabetes mellitus with foot ulcer: Secondary | ICD-10-CM | POA: Diagnosis not present

## 2021-08-04 DIAGNOSIS — R531 Weakness: Secondary | ICD-10-CM | POA: Diagnosis not present

## 2021-08-04 DIAGNOSIS — L0390 Cellulitis, unspecified: Secondary | ICD-10-CM | POA: Diagnosis present

## 2021-08-04 DIAGNOSIS — I4891 Unspecified atrial fibrillation: Secondary | ICD-10-CM | POA: Diagnosis not present

## 2021-08-04 DIAGNOSIS — E785 Hyperlipidemia, unspecified: Secondary | ICD-10-CM | POA: Diagnosis not present

## 2021-08-04 DIAGNOSIS — I872 Venous insufficiency (chronic) (peripheral): Secondary | ICD-10-CM | POA: Insufficient documentation

## 2021-08-04 DIAGNOSIS — Z7901 Long term (current) use of anticoagulants: Secondary | ICD-10-CM

## 2021-08-04 DIAGNOSIS — L89893 Pressure ulcer of other site, stage 3: Secondary | ICD-10-CM | POA: Insufficient documentation

## 2021-08-04 DIAGNOSIS — E7800 Pure hypercholesterolemia, unspecified: Secondary | ICD-10-CM | POA: Diagnosis present

## 2021-08-04 DIAGNOSIS — I2510 Atherosclerotic heart disease of native coronary artery without angina pectoris: Secondary | ICD-10-CM | POA: Insufficient documentation

## 2021-08-04 DIAGNOSIS — I482 Chronic atrial fibrillation, unspecified: Secondary | ICD-10-CM | POA: Diagnosis present

## 2021-08-04 DIAGNOSIS — E119 Type 2 diabetes mellitus without complications: Secondary | ICD-10-CM | POA: Diagnosis not present

## 2021-08-04 DIAGNOSIS — E1169 Type 2 diabetes mellitus with other specified complication: Principal | ICD-10-CM | POA: Diagnosis present

## 2021-08-04 DIAGNOSIS — G2581 Restless legs syndrome: Secondary | ICD-10-CM | POA: Diagnosis not present

## 2021-08-04 DIAGNOSIS — I5032 Chronic diastolic (congestive) heart failure: Secondary | ICD-10-CM | POA: Diagnosis present

## 2021-08-04 DIAGNOSIS — I5042 Chronic combined systolic (congestive) and diastolic (congestive) heart failure: Secondary | ICD-10-CM | POA: Insufficient documentation

## 2021-08-04 DIAGNOSIS — T148XXD Other injury of unspecified body region, subsequent encounter: Secondary | ICD-10-CM

## 2021-08-04 DIAGNOSIS — B9689 Other specified bacterial agents as the cause of diseases classified elsewhere: Secondary | ICD-10-CM | POA: Diagnosis present

## 2021-08-04 DIAGNOSIS — I878 Other specified disorders of veins: Secondary | ICD-10-CM | POA: Diagnosis present

## 2021-08-04 DIAGNOSIS — L97522 Non-pressure chronic ulcer of other part of left foot with fat layer exposed: Principal | ICD-10-CM

## 2021-08-04 DIAGNOSIS — I252 Old myocardial infarction: Secondary | ICD-10-CM

## 2021-08-04 DIAGNOSIS — Z8249 Family history of ischemic heart disease and other diseases of the circulatory system: Secondary | ICD-10-CM

## 2021-08-04 DIAGNOSIS — Z7984 Long term (current) use of oral hypoglycemic drugs: Secondary | ICD-10-CM

## 2021-08-04 DIAGNOSIS — I480 Paroxysmal atrial fibrillation: Secondary | ICD-10-CM | POA: Diagnosis present

## 2021-08-04 DIAGNOSIS — Z28310 Unvaccinated for covid-19: Secondary | ICD-10-CM | POA: Diagnosis not present

## 2021-08-04 DIAGNOSIS — L03116 Cellulitis of left lower limb: Secondary | ICD-10-CM | POA: Diagnosis present

## 2021-08-04 DIAGNOSIS — Z7401 Bed confinement status: Secondary | ICD-10-CM | POA: Diagnosis not present

## 2021-08-04 DIAGNOSIS — Z89421 Acquired absence of other right toe(s): Secondary | ICD-10-CM | POA: Insufficient documentation

## 2021-08-04 DIAGNOSIS — J449 Chronic obstructive pulmonary disease, unspecified: Secondary | ICD-10-CM | POA: Diagnosis present

## 2021-08-04 DIAGNOSIS — I5033 Acute on chronic diastolic (congestive) heart failure: Secondary | ICD-10-CM | POA: Diagnosis not present

## 2021-08-04 DIAGNOSIS — E1142 Type 2 diabetes mellitus with diabetic polyneuropathy: Secondary | ICD-10-CM | POA: Diagnosis present

## 2021-08-04 DIAGNOSIS — I70235 Atherosclerosis of native arteries of right leg with ulceration of other part of foot: Secondary | ICD-10-CM | POA: Insufficient documentation

## 2021-08-04 DIAGNOSIS — Z6841 Body Mass Index (BMI) 40.0 and over, adult: Secondary | ICD-10-CM

## 2021-08-04 DIAGNOSIS — L97818 Non-pressure chronic ulcer of other part of right lower leg with other specified severity: Secondary | ICD-10-CM | POA: Insufficient documentation

## 2021-08-04 DIAGNOSIS — G4710 Hypersomnia, unspecified: Secondary | ICD-10-CM | POA: Diagnosis present

## 2021-08-04 DIAGNOSIS — L89623 Pressure ulcer of left heel, stage 3: Secondary | ICD-10-CM | POA: Insufficient documentation

## 2021-08-04 DIAGNOSIS — I110 Hypertensive heart disease with heart failure: Secondary | ICD-10-CM | POA: Diagnosis present

## 2021-08-04 DIAGNOSIS — E1165 Type 2 diabetes mellitus with hyperglycemia: Secondary | ICD-10-CM | POA: Diagnosis present

## 2021-08-04 DIAGNOSIS — M86162 Other acute osteomyelitis, left tibia and fibula: Secondary | ICD-10-CM | POA: Diagnosis present

## 2021-08-04 DIAGNOSIS — G4733 Obstructive sleep apnea (adult) (pediatric): Secondary | ICD-10-CM | POA: Diagnosis present

## 2021-08-04 DIAGNOSIS — D649 Anemia, unspecified: Secondary | ICD-10-CM | POA: Diagnosis not present

## 2021-08-04 DIAGNOSIS — M86672 Other chronic osteomyelitis, left ankle and foot: Secondary | ICD-10-CM | POA: Diagnosis not present

## 2021-08-04 DIAGNOSIS — L97422 Non-pressure chronic ulcer of left heel and midfoot with fat layer exposed: Secondary | ICD-10-CM | POA: Diagnosis not present

## 2021-08-04 DIAGNOSIS — I70245 Atherosclerosis of native arteries of left leg with ulceration of other part of foot: Secondary | ICD-10-CM | POA: Insufficient documentation

## 2021-08-04 DIAGNOSIS — S91302A Unspecified open wound, left foot, initial encounter: Secondary | ICD-10-CM | POA: Diagnosis not present

## 2021-08-04 DIAGNOSIS — Z951 Presence of aortocoronary bypass graft: Secondary | ICD-10-CM

## 2021-08-04 DIAGNOSIS — E11628 Type 2 diabetes mellitus with other skin complications: Secondary | ICD-10-CM | POA: Diagnosis not present

## 2021-08-04 DIAGNOSIS — M7732 Calcaneal spur, left foot: Secondary | ICD-10-CM | POA: Diagnosis not present

## 2021-08-04 DIAGNOSIS — Z7982 Long term (current) use of aspirin: Secondary | ICD-10-CM

## 2021-08-04 DIAGNOSIS — M6259 Muscle wasting and atrophy, not elsewhere classified, multiple sites: Secondary | ICD-10-CM | POA: Diagnosis not present

## 2021-08-04 DIAGNOSIS — R2681 Unsteadiness on feet: Secondary | ICD-10-CM | POA: Diagnosis not present

## 2021-08-04 DIAGNOSIS — M869 Osteomyelitis, unspecified: Secondary | ICD-10-CM | POA: Diagnosis not present

## 2021-08-04 DIAGNOSIS — L089 Local infection of the skin and subcutaneous tissue, unspecified: Secondary | ICD-10-CM | POA: Diagnosis not present

## 2021-08-04 DIAGNOSIS — L97412 Non-pressure chronic ulcer of right heel and midfoot with fat layer exposed: Secondary | ICD-10-CM | POA: Diagnosis not present

## 2021-08-04 DIAGNOSIS — Z89422 Acquired absence of other left toe(s): Secondary | ICD-10-CM

## 2021-08-04 DIAGNOSIS — Z9889 Other specified postprocedural states: Secondary | ICD-10-CM | POA: Diagnosis not present

## 2021-08-04 DIAGNOSIS — E11622 Type 2 diabetes mellitus with other skin ulcer: Secondary | ICD-10-CM | POA: Insufficient documentation

## 2021-08-04 DIAGNOSIS — R5381 Other malaise: Secondary | ICD-10-CM | POA: Diagnosis not present

## 2021-08-04 DIAGNOSIS — Z79899 Other long term (current) drug therapy: Secondary | ICD-10-CM

## 2021-08-04 LAB — URINALYSIS, ROUTINE W REFLEX MICROSCOPIC
Bacteria, UA: NONE SEEN
Bilirubin Urine: NEGATIVE
Glucose, UA: NEGATIVE mg/dL
Hgb urine dipstick: NEGATIVE
Ketones, ur: NEGATIVE mg/dL
Leukocytes,Ua: NEGATIVE
Nitrite: NEGATIVE
Protein, ur: 100 mg/dL — AB
Specific Gravity, Urine: 1.011 (ref 1.005–1.030)
Squamous Epithelial / HPF: NONE SEEN (ref 0–5)
pH: 5 (ref 5.0–8.0)

## 2021-08-04 LAB — COMPREHENSIVE METABOLIC PANEL
ALT: 21 U/L (ref 0–44)
AST: 19 U/L (ref 15–41)
Albumin: 3.1 g/dL — ABNORMAL LOW (ref 3.5–5.0)
Alkaline Phosphatase: 155 U/L — ABNORMAL HIGH (ref 38–126)
Anion gap: 10 (ref 5–15)
BUN: 22 mg/dL (ref 8–23)
CO2: 28 mmol/L (ref 22–32)
Calcium: 8.6 mg/dL — ABNORMAL LOW (ref 8.9–10.3)
Chloride: 100 mmol/L (ref 98–111)
Creatinine, Ser: 0.87 mg/dL (ref 0.61–1.24)
GFR, Estimated: >60 mL/min (ref >60)
Glucose, Bld: 190 mg/dL — ABNORMAL HIGH (ref 70–99)
Potassium: 4.1 mmol/L (ref 3.5–5.1)
Sodium: 138 mmol/L (ref 135–145)
Total Bilirubin: 0.6 mg/dL (ref 0.3–1.2)
Total Protein: 7.5 g/dL (ref 6.5–8.1)

## 2021-08-04 LAB — CBC WITH DIFFERENTIAL/PLATELET
Abs Immature Granulocytes: 0.04 10*3/uL (ref 0.00–0.07)
Basophils Absolute: 0.1 10*3/uL (ref 0.0–0.1)
Basophils Relative: 1 %
Eosinophils Absolute: 0.1 10*3/uL (ref 0.0–0.5)
Eosinophils Relative: 1 %
HCT: 35.7 % — ABNORMAL LOW (ref 39.0–52.0)
Hemoglobin: 10.8 g/dL — ABNORMAL LOW (ref 13.0–17.0)
Immature Granulocytes: 0 %
Lymphocytes Relative: 8 %
Lymphs Abs: 0.9 10*3/uL (ref 0.7–4.0)
MCH: 27.3 pg (ref 26.0–34.0)
MCHC: 30.3 g/dL (ref 30.0–36.0)
MCV: 90.2 fL (ref 80.0–100.0)
Monocytes Absolute: 0.8 10*3/uL (ref 0.1–1.0)
Monocytes Relative: 8 %
Neutro Abs: 8.6 10*3/uL — ABNORMAL HIGH (ref 1.7–7.7)
Neutrophils Relative %: 82 %
Platelets: 203 10*3/uL (ref 150–400)
RBC: 3.96 MIL/uL — ABNORMAL LOW (ref 4.22–5.81)
RDW: 16.1 % — ABNORMAL HIGH (ref 11.5–15.5)
WBC: 10.5 10*3/uL (ref 4.0–10.5)
nRBC: 0 % (ref 0.0–0.2)

## 2021-08-04 LAB — LACTIC ACID, PLASMA: Lactic Acid, Venous: 1.9 mmol/L (ref 0.5–1.9)

## 2021-08-04 LAB — SEDIMENTATION RATE: Sed Rate: 58 mm/hr — ABNORMAL HIGH (ref 0–20)

## 2021-08-04 LAB — GLUCOSE, CAPILLARY: Glucose-Capillary: 243 mg/dL — ABNORMAL HIGH (ref 70–99)

## 2021-08-04 MED ORDER — LOSARTAN POTASSIUM 50 MG PO TABS
100.0000 mg | ORAL_TABLET | Freq: Every day | ORAL | Status: DC
  Administered 2021-08-05 – 2021-08-11 (×6): 100 mg via ORAL
  Filled 2021-08-04 (×6): qty 2

## 2021-08-04 MED ORDER — EZETIMIBE 10 MG PO TABS
10.0000 mg | ORAL_TABLET | Freq: Every day | ORAL | Status: DC
  Administered 2021-08-04 – 2021-08-10 (×7): 10 mg via ORAL
  Filled 2021-08-04 (×7): qty 1

## 2021-08-04 MED ORDER — INSULIN GLARGINE-YFGN 100 UNIT/ML ~~LOC~~ SOLN
40.0000 [IU] | Freq: Two times a day (BID) | SUBCUTANEOUS | Status: DC
  Administered 2021-08-04 – 2021-08-08 (×7): 40 [IU] via SUBCUTANEOUS
  Filled 2021-08-04 (×9): qty 0.4

## 2021-08-04 MED ORDER — ACETAMINOPHEN 325 MG PO TABS
650.0000 mg | ORAL_TABLET | Freq: Four times a day (QID) | ORAL | Status: DC | PRN
  Administered 2021-08-05: 650 mg via ORAL
  Filled 2021-08-04: qty 2

## 2021-08-04 MED ORDER — DAKINS (1/2 STRENGTH) 0.25 % EX SOLN
Freq: Every day | CUTANEOUS | Status: DC | PRN
  Filled 2021-08-04: qty 473

## 2021-08-04 MED ORDER — INSULIN ASPART 100 UNIT/ML IJ SOLN
0.0000 [IU] | Freq: Every day | INTRAMUSCULAR | Status: DC
  Administered 2021-08-04: 2 [IU] via SUBCUTANEOUS
  Filled 2021-08-04: qty 1

## 2021-08-04 MED ORDER — FERROUS SULFATE 325 (65 FE) MG PO TABS
325.0000 mg | ORAL_TABLET | Freq: Every day | ORAL | Status: DC
  Administered 2021-08-05 – 2021-08-11 (×6): 325 mg via ORAL
  Filled 2021-08-04 (×6): qty 1

## 2021-08-04 MED ORDER — VANCOMYCIN HCL 1500 MG/300ML IV SOLN
1500.0000 mg | Freq: Once | INTRAVENOUS | Status: AC
  Administered 2021-08-04: 1500 mg via INTRAVENOUS
  Filled 2021-08-04: qty 300

## 2021-08-04 MED ORDER — INSULIN ASPART 100 UNIT/ML IJ SOLN
0.0000 [IU] | Freq: Three times a day (TID) | INTRAMUSCULAR | Status: DC
  Administered 2021-08-05 (×3): 4 [IU] via SUBCUTANEOUS
  Administered 2021-08-06 (×2): 7 [IU] via SUBCUTANEOUS
  Filled 2021-08-04 (×5): qty 1

## 2021-08-04 MED ORDER — FUROSEMIDE 20 MG PO TABS
20.0000 mg | ORAL_TABLET | Freq: Every day | ORAL | Status: DC
  Administered 2021-08-05: 20 mg via ORAL
  Filled 2021-08-04: qty 1

## 2021-08-04 MED ORDER — VANCOMYCIN HCL IN DEXTROSE 1-5 GM/200ML-% IV SOLN
1000.0000 mg | Freq: Once | INTRAVENOUS | Status: AC
  Administered 2021-08-04: 1000 mg via INTRAVENOUS
  Filled 2021-08-04: qty 200

## 2021-08-04 MED ORDER — ADULT MULTIVITAMIN W/MINERALS CH
1.0000 | ORAL_TABLET | Freq: Every day | ORAL | Status: DC
  Administered 2021-08-05 – 2021-08-11 (×6): 1 via ORAL
  Filled 2021-08-04 (×7): qty 1

## 2021-08-04 MED ORDER — AMIODARONE HCL 200 MG PO TABS
200.0000 mg | ORAL_TABLET | Freq: Every day | ORAL | Status: DC
  Administered 2021-08-05 – 2021-08-11 (×7): 200 mg via ORAL
  Filled 2021-08-04 (×7): qty 1

## 2021-08-04 MED ORDER — APIXABAN 5 MG PO TABS
5.0000 mg | ORAL_TABLET | Freq: Two times a day (BID) | ORAL | Status: DC
  Administered 2021-08-04 – 2021-08-05 (×2): 5 mg via ORAL
  Filled 2021-08-04 (×2): qty 1

## 2021-08-04 MED ORDER — SODIUM CHLORIDE 0.9 % IV SOLN
2.0000 g | Freq: Three times a day (TID) | INTRAVENOUS | Status: DC
  Administered 2021-08-05: 2 g via INTRAVENOUS
  Filled 2021-08-04 (×3): qty 12.5

## 2021-08-04 MED ORDER — HEPARIN SODIUM (PORCINE) 5000 UNIT/ML IJ SOLN: 5000.0000 [IU] | Freq: Three times a day (TID) | INTRAMUSCULAR | Status: DC

## 2021-08-04 MED ORDER — METOPROLOL TARTRATE 50 MG PO TABS
75.0000 mg | ORAL_TABLET | Freq: Two times a day (BID) | ORAL | Status: DC
  Administered 2021-08-04 – 2021-08-11 (×14): 75 mg via ORAL
  Filled 2021-08-04 (×14): qty 1

## 2021-08-04 MED ORDER — ZINC SULFATE 220 (50 ZN) MG PO CAPS
220.0000 mg | ORAL_CAPSULE | Freq: Every day | ORAL | Status: DC
  Administered 2021-08-05 – 2021-08-11 (×6): 220 mg via ORAL
  Filled 2021-08-04 (×7): qty 1

## 2021-08-04 MED ORDER — SODIUM CHLORIDE 0.9 % IV SOLN
2.0000 g | Freq: Once | INTRAVENOUS | Status: AC
  Administered 2021-08-04: 2 g via INTRAVENOUS
  Filled 2021-08-04: qty 12.5

## 2021-08-04 MED ORDER — METFORMIN HCL 500 MG PO TABS
1000.0000 mg | ORAL_TABLET | Freq: Two times a day (BID) | ORAL | Status: DC
  Administered 2021-08-05 – 2021-08-10 (×10): 1000 mg via ORAL
  Filled 2021-08-04 (×11): qty 2

## 2021-08-04 MED ORDER — MORPHINE SULFATE (PF) 2 MG/ML IV SOLN
2.0000 mg | INTRAVENOUS | Status: AC | PRN
  Administered 2021-08-05 – 2021-08-08 (×3): 2 mg via INTRAVENOUS
  Filled 2021-08-04 (×3): qty 1

## 2021-08-04 MED ORDER — ACETAMINOPHEN 650 MG RE SUPP: 650.0000 mg | Freq: Four times a day (QID) | RECTAL | Status: DC | PRN

## 2021-08-04 MED ORDER — ASPIRIN EC 81 MG PO TBEC
81.0000 mg | DELAYED_RELEASE_TABLET | Freq: Every day | ORAL | Status: DC
  Administered 2021-08-05 – 2021-08-11 (×7): 81 mg via ORAL
  Filled 2021-08-04 (×7): qty 1

## 2021-08-04 MED ORDER — METOPROLOL TARTRATE 5 MG/5ML IV SOLN: 5.0000 mg | Freq: Three times a day (TID) | INTRAVENOUS | Status: DC | PRN

## 2021-08-04 MED ORDER — GABAPENTIN 300 MG PO CAPS
300.0000 mg | ORAL_CAPSULE | Freq: Every day | ORAL | Status: DC
  Administered 2021-08-04 – 2021-08-10 (×7): 300 mg via ORAL
  Filled 2021-08-04 (×7): qty 1

## 2021-08-04 MED ORDER — DILTIAZEM HCL ER COATED BEADS 300 MG PO CP24
300.0000 mg | ORAL_CAPSULE | Freq: Every day | ORAL | Status: DC
  Administered 2021-08-05 – 2021-08-11 (×6): 300 mg via ORAL
  Filled 2021-08-04 (×7): qty 1

## 2021-08-04 MED ORDER — NITROGLYCERIN 0.4 MG SL SUBL: 0.4000 mg | SUBLINGUAL_TABLET | SUBLINGUAL | Status: DC | PRN

## 2021-08-04 MED ORDER — VANCOMYCIN HCL 1750 MG/350ML IV SOLN
1750.0000 mg | Freq: Two times a day (BID) | INTRAVENOUS | Status: DC
  Administered 2021-08-05: 1750 mg via INTRAVENOUS
  Filled 2021-08-04 (×2): qty 350

## 2021-08-04 MED ORDER — VITAMIN B-12 1000 MCG PO TABS
10000.0000 ug | ORAL_TABLET | Freq: Every day | ORAL | Status: DC
  Administered 2021-08-05 – 2021-08-09 (×4): 10000 ug via ORAL
  Filled 2021-08-04 (×8): qty 10

## 2021-08-04 MED ORDER — ONDANSETRON HCL 4 MG/2ML IJ SOLN: 4.0000 mg | Freq: Four times a day (QID) | INTRAMUSCULAR | Status: DC | PRN

## 2021-08-04 MED ORDER — ALBUTEROL SULFATE (2.5 MG/3ML) 0.083% IN NEBU: 2.5000 mg | INHALATION_SOLUTION | Freq: Four times a day (QID) | RESPIRATORY_TRACT | Status: DC | PRN

## 2021-08-04 MED ORDER — ONDANSETRON HCL 4 MG PO TABS: 4.0000 mg | ORAL_TABLET | Freq: Four times a day (QID) | ORAL | Status: DC | PRN

## 2021-08-04 MED ORDER — FUROSEMIDE 40 MG PO TABS
40.0000 mg | ORAL_TABLET | Freq: Every day | ORAL | Status: DC
  Administered 2021-08-05 – 2021-08-11 (×6): 40 mg via ORAL
  Filled 2021-08-04 (×6): qty 1

## 2021-08-04 MED ORDER — ISOSORBIDE MONONITRATE ER 30 MG PO TB24
60.0000 mg | ORAL_TABLET | Freq: Two times a day (BID) | ORAL | Status: DC
  Administered 2021-08-04 – 2021-08-11 (×13): 60 mg via ORAL
  Filled 2021-08-04 (×13): qty 2

## 2021-08-04 MED ORDER — POLYETHYLENE GLYCOL 3350 17 G PO PACK: 17.0000 g | PACK | Freq: Every day | ORAL | Status: DC | PRN

## 2021-08-04 MED ORDER — ASCORBIC ACID 500 MG PO TABS
500.0000 mg | ORAL_TABLET | Freq: Every day | ORAL | Status: DC
  Administered 2021-08-05 – 2021-08-11 (×6): 500 mg via ORAL
  Filled 2021-08-04 (×6): qty 1

## 2021-08-04 MED ORDER — TRAMADOL HCL 50 MG PO TABS
50.0000 mg | ORAL_TABLET | Freq: Four times a day (QID) | ORAL | Status: DC | PRN
  Administered 2021-08-04 – 2021-08-08 (×3): 50 mg via ORAL
  Filled 2021-08-04 (×3): qty 1

## 2021-08-04 MED ORDER — TAMSULOSIN HCL 0.4 MG PO CAPS
0.4000 mg | ORAL_CAPSULE | Freq: Every day | ORAL | Status: DC
  Administered 2021-08-05 – 2021-08-11 (×7): 0.4 mg via ORAL
  Filled 2021-08-04 (×8): qty 1

## 2021-08-04 NOTE — Assessment & Plan Note (Addendum)
Blood pressure within goal. ?- Home losartan 100 mg daily resumed for 08/05/2021 ?- Metoprolol tartrate 75 mg p.o. twice daily resumed ?

## 2021-08-04 NOTE — Progress Notes (Signed)
PHARMACY -  BRIEF ANTIBIOTIC NOTE  ? ?Pharmacy has received consult(s) for vancomycin and cefepime from an ED provider.  The patient's profile has been reviewed for ht/wt/allergies/indication/available labs.   ? ?One time order(s) placed by MD for Vancomycin and cefepime ? ?Further antibiotics/pharmacy consults should be ordered by admitting physician if indicated.       ?                ?Thank you, ?Javarus Dorner A ?08/04/2021  5:12 PM  ?

## 2021-08-04 NOTE — Assessment & Plan Note (Addendum)
-   CPAP nightly ordered ?- He endorses compliance of cpap use ?

## 2021-08-04 NOTE — ED Triage Notes (Signed)
Pt was sent from the wound center , pt has a wound to the right heel he been getting treated at the wound center for the past month, states the wound has gotten bigger and now having redness up the leg and is concerned for osteomyelitis/cellulitis. ?

## 2021-08-04 NOTE — Assessment & Plan Note (Addendum)
-   Resumed home Eliquis 5 mg p.o. twice daily ?- Resumed home diltiazem 300 mg daily before breakfast, amiodarone 200 mg daily before breakfast, metoprolol tartrate 75 mg p.o. twice daily ?

## 2021-08-04 NOTE — Assessment & Plan Note (Addendum)
Patient has chronic nonhealing wound, failed multiple courses of antibiotics. ?CRP and ESR elevated.  Blood cultures negative in 24 hours.  Clinically looks like a lot of dead tissue with minimum erythema and serous discharge, not very impressive for acute infection.  MRI with concern of osteomyelitis.  Podiatry is planning debridement on Monday. ?High risk for BKA.  He was placed on vancomycin and cefepime on admission ?-Stop antibiotics for now and we will restart after getting deep tissue and bone biopsy for culture on Monday, patient currently medically stable ?- Per patient he uses Dakin's solution 0.25% daily around the perimeter of his wound ?- Wound care was consulted- continue with their recommendations. ?-Continue to monitor, if becomes febrile we can restart antibiotics ?

## 2021-08-04 NOTE — Progress Notes (Addendum)
Caltagirone, Posey A. ([id number]) ?Visit Report for 08/04/2021 ?Chief Complaint Document Details ?Patient Name: [name], [name] ?Date of Service: 08/04/2021 1:45 PM ?Medical Record Number: [id number] ?Patient Account Number: [id number] ?Date of Birth/Sex: 10-29-1955 (66 y.o. M) ?Treating RN: Donnamarie Poag ?Primary Care Provider: Fulton Reek Other Clinician: ?Referring Provider: Fulton Reek ?Treating Provider/Extender: Jeri Cos ?Weeks in Treatment: 10 ?Information Obtained from: Patient ?Chief Complaint ?Bilateral LE Ulcers ?Electronic Signature(s) ?Signed: 08/04/2021 2:12:14 PM By: Worthy Keeler PA-C ?Entered By: Worthy Keeler on 08/04/2021 14:12:14 ?Deveney, [name] ([id number]) ?-------------------------------------------------------------------------------- ?HPI Details ?Patient Name: [name], [name] ?Date of Service: 08/04/2021 1:45 PM ?Medical Record Number: [id number] ?Patient Account Number: [id number] ?Date of Birth/Sex: Mar 10, 1956 (66 y.o. M) ?Treating RN: Donnamarie Poag ?Primary Care Provider: Fulton Reek Other Clinician: ?Referring Provider: Fulton Reek ?Treating Provider/Extender: Jeri Cos ?Weeks in Treatment: 10 ?History of Present Illness ?HPI Description: Admission 8/31 ?Mr. Whitfield longus is a 66 year old male with a past medical history of uncontrolled insulin-dependent type 2 diabetes, CAD, osteomyelitis to the ?left third toe status post partial third ray amputation, and peripheral arterial disease that presents for a left plantar foot wound and bilateral lower ?extremity wounds. The left plantar foot wound has been present for at least 7 months. He has been following podiatry for this issue. He uses silver ?collagen to this area. He reports that in the past 1 to 2 months he has developed multiple scattered open wounds to his lower extremities ?bilaterally that have worsened over time. He states that they often develop as either blisters or he hits his leg against an object.He is  currently ?using Betadine to these areas. He denies signs of infection. He has compression stockings however he cannot put these on very easily. Also he is ?on his feet most of the day. He states he does not have anyone to help him do the activities he needs to get done. ?Readmission: ?05/26/2021 upon evaluation today patient presents for reevaluation here in the clinic concerning issues he is been having with his bilateral lower ?extremities, his right great toe, and left heel primarily. With that being said he has been in the hospital recently and his hemoglobin A1c on ?January 10 was 9.2. With that being said he does have a lot of really thickened dry skin attached to his legs when he try to remove that today. He ?is in agreement with the plan. Nonetheless I do believe that based on what we are seeing he is been having a lot of issues with his congestive ?heart failure I do have him on fluid pills he does go to the bathroom quite a bit. Subsequently has been in [employer] but I do not think that ?is doing quite enough to help control his edema. He also needs something better than Santyl to put on the heel right now when he was in the ?hospital they actually cut a space open in the compression around his heel so they can open it up and actually apply the Santyl daily. I think he ?needs something better than that. ?Again the patient does have a history of diabetes mellitus type 2, chronic venous insufficiency, hypertension, and congestive heart failure. He also ?has signs of stage III lymphedema as well. ?06/02/2021 upon evaluation today patient appears to be doing decently well in regard to his wounds. Fortunately I do not see any signs of active ?infection at this time which is great news. Fortunately I think that he is making excellent progress. Nonetheless I do think  that we seem to be ?making some progress here. ?06/09/2021 upon evaluation today patient appears to be doing well with regard to his legs all  things considered unfortunately the left heel is not ?doing nearly as well as what I like to see. There is a lot of necrotic tissue this is very boggy. I think that it is going to require potentially much ?more debridement but again we really need to get the results of the arterial testing done and this is actually scheduled for the 15th. ?2/17; arrives in clinic today with the left plantar heel wound completely necrotic and malodorous. Comparing the picture with last week this looks ?quite a bit worse. He has been using Iodoflex under compression. He has an area on the right first toe which seems clean and better and a new ?area on the right anterior mid Tibia. ?I was unable to find the results of his arterial studies that he had done on 1/15. Presumably they have not been read. I can see that they have ?been done ?06/23/2021 upon evaluation today patient appears to be doing well currently in regard to his legs. I am actually very pleased in that regard. ?Fortunately I do not see any signs of active infection at this time. I have reviewed his arterial study and it appeared to be pretty good with TBI's ?on the left of 0.88 on the right 0.87. Overall I think that we are definitely headed in the right direction which is great news. Fortunately I do not ?see any signs of active infection locally or systemically at this time. ?06/30/2021 upon evaluation today patient appears to be doing well with regard to the right lower extremity ulcers with very pleased in this regard. I ?do feel like the Iodoflex has done well here. In regard to the left heel location this is something that I am actually a little bit more concerned about ?I really think that switching over to the Dakin's moistened gauze dressing may be the best way to go at this point based on what we are seeing. ?We have been using Iodoflex is just not clearing up quite as well as I would like to see and I think this may be a better method at which to get ?things  under better control. The good news is he does have his Velcro compression wrap so we can try that at this point as well. ?3/10; patient has chronic venous insufficiency with a wound on the right anterior mid tibia, dorsal right first toe a large plantar left heel wound. ?We are using Dakin's on the left heel and Iodosorb on the right anterior lower extremity and the right first toe. The areas on the right are ?apparently a lot better. The left heel is certainly a lot better than I remember seeing this the last time 3 weeks ago. He is using a scooter to ?offload the left heel ?07/14/2021 upon evaluation today patient appears to be doing well with regard to his wounds in general. The heel is showing signs of improvement ?which is great on the left. On the right side leg this appears to be almost completely healed. The biggest issue is he has a lot of the dry skin ?building up I do believe that he is going to need to switch over to the Velcro wraps so he can put lotion on these areas to try to keep it from ?building up as much he will also be able to wash the leg which will be beneficial as  well in my opinion. We discussed that in greater detail today. ?07/21/2021 on inspection today patient's left heel is actually showing signs of excellent improvement which is great news overall very pleased in ?that regard. I do not see any evidence of active infection locally nor systemically which is great news as well. ?07/28/2021 upon evaluation today patient appears to be doing better in all wound locations except for his left heel. He has been up on it more in ?the past week and it shows. He does have some deep tissue injury as well as some additional breakdown unfortunately. I think that he needs to ?be very careful with this because if he is not is going to risk opening up into a much deeper wound which is definitely not what we want to see. ?08-04-2021 upon evaluation today patient unfortunately is doing significantly worse in  regard to his left heel ulcer. It actually appears to be much ?Laughter, Rahn A. ([id number]) ?more necrotic than what we have been seeing. This was actually doing quite well until really last week when he

## 2021-08-04 NOTE — Assessment & Plan Note (Addendum)
No chest pain. ?- Aspirin 81 mg daily, ezetimibe 10 mg nightly, Imdur 60 mg p.o. twice daily resumed ?- Home nitroglycerin sublingual every 5 minutes as needed for chest pain resumed ?

## 2021-08-04 NOTE — Assessment & Plan Note (Addendum)
Podiatry is on board, going for wound debridement on Monday. ?Please see above note ?

## 2021-08-04 NOTE — Assessment & Plan Note (Addendum)
-   Continue with Home ezetimibe 10 mg nightly  ?

## 2021-08-04 NOTE — Hospital Course (Addendum)
66 year old male with past medical history of poorly controlled diabetes on insulin, hypertension, morbid obesity, COPD, atrial fibrillation on anticoagulation, CAD status post CABG obstructive sleep apnea and PAD with a chronic left foot ulcer who presented to the emergency room after being sent over from the wound clinic for worsening cellulitis of his left foot that was not responding to outpatient antibiotics.  Patient admitted to the hospitalist service and podiatry consulted.  An MRI of the left ankle was done noting early osteomyelitis.  Given concerns for BKA, podiatry took patient on 4/10 for wound debridement and partial calcanectomy.  Postop, they are recommending 6 weeks of IV antibiotics.  Infectious disease consulted.   ? ? ?

## 2021-08-04 NOTE — Consult Note (Signed)
Pharmacy Antibiotic Note ? ?[name]. is a 66 y.o. male admitted on 08/04/2021 with cellulitis.  Pharmacy has been consulted for vancomycin & cefepime dosing. ? ?Plan: ?Per consult planning 7d of total treatment (4/7-4/14). F/u MRSA PCR ordered & cultures. Consider vancomycin trough before 4th or 5th dose if continued. ? ?Cefepime 2g IV x1 in ED (4/07 1730); followed by cefepime 2g IV q8h thereafter for total of 7day treatment. ?Vancomycin 2.5g (1g+1.5g) given as loading dose (4/7 1930); followed by vancomycin 1.75g q12h starting 4/08 0800 ?Goal AUC 400-550  ?Est AUC: 472; Cmax: 32.5; Cmin: 13 ?SCr 0.87; IBW/TBW; Vd 0.5 (BMI 42) ? ?Height: 6' 4.5" (194.3 cm) ?Weight: (!) 161.5 kg (356 lb) ?IBW/kg (Calculated) : 87.95 ? ?Temp (24hrs), Avg:98.9 ?F (37.2 ?C), Min:98.9 ?F (37.2 ?C), Max:98.9 ?F (37.2 ?C) ? ?Recent Labs  ?Lab 08/04/21 ?1539  ?WBC 10.5  ?CREATININE 0.87  ?LATICACIDVEN 1.9  ?  ?Estimated Creatinine Clearance: 138.7 mL/min (by C-G formula based on SCr of 0.87 mg/dL).   ? ?Allergies  ?Allergen Reactions  ? Statins   ?  Other reaction(s): Muscle Pain ?Causes legs to ache per pt  ? ? ?Antimicrobials this admission: ?Vancomycin/Cefepime (4/7 >> 4/14) ? ?Dose adjustments this admission: ?CTM and adjust PRN. ? ?Microbiology results: ?4/07 BCx: sent ?4/07 MRSA PCR: ordered ? ?Thank you for allowing pharmacy to be a part of this patient?s care. ? ?Shanon Brow [name] ?08/04/2021 8:32 PM ? ?

## 2021-08-04 NOTE — Assessment & Plan Note (Addendum)
-   Continue with home tamsulosin 0.4 mg p.o. daily ?

## 2021-08-04 NOTE — ED Provider Notes (Signed)
? ?[facility] ?Provider Note ? ? ? Event Date/Time  ? First MD Initiated Contact with Patient 08/04/21 1619   ?  (approximate) ? ? ?History  ? ?Wound Infection ? ? ?HPI ? ?[name]. is a 66 y.o. male with PMH of poorly controlled diabetes who presents with worsening left leg swelling and redness and concern for cellulitis.  The patient states he was referred in by his wound care specialist to change his dressing today and noted that in the last few days the patient's leg has become increasingly red and swollen.  The patient denies fever or chills.  He denies significant acute pain.  He has had no new trauma to the leg.  He states that he was on a course of an Augmentin several weeks ago. ? ? ? ?Physical Exam  ? ?Triage Vital Signs: ?ED Triage Vitals  ?Enc Vitals Group  ?   BP 08/04/21 1535 130/71  ?   Pulse Rate 08/04/21 1535 (!) 124  ?   Resp 08/04/21 1535 18  ?   Temp 08/04/21 1535 98.9 ?F (37.2 ?C)  ?   Temp Source 08/04/21 1535 Oral  ?   SpO2 08/04/21 1535 94 %  ?   Weight 08/04/21 1538 (!) 356 lb (161.5 kg)  ?   Height 08/04/21 1538 6' 4.5" (1.943 m)  ?   Head Circumference --   ?   Peak Flow --   ?   Pain Score --   ?   Pain Loc --   ?   Pain Edu? --   ?   Excl. in [place]? --   ? ? ?Most recent vital signs: ?Vitals:  ? 08/04/21 1900 08/04/21 2033  ?BP: 114/76 128/86  ?Pulse: (!) 122 (!) 119  ?Resp:  18  ?Temp:  98 ?F (36.7 ?C)  ?SpO2: 95% 96%  ? ? ? ?General: Awake, no distress.  ?CV:  Good peripheral perfusion.  ?Resp:  Normal effort.  ?Abd:  No distention.  ?Other:  Left lower extremity with approximately 3+ pitting edema.  Erythema, induration, and warmth to the entirety of the anterior lower leg.  Heel area wound with no exposed bone.  2+ DP pulse.  Motor and sensory intact to the distal foot. ? ? ?ED Results / Procedures / Treatments  ? ?Labs ?(all labs ordered are listed, but only abnormal results are displayed) ?Labs Reviewed  ?COMPREHENSIVE METABOLIC PANEL - Abnormal;  Notable for the following components:  ?    Result Value  ? Glucose, Bld 190 (*)   ? Calcium 8.6 (*)   ? Albumin 3.1 (*)   ? Alkaline Phosphatase 155 (*)   ? All other components within normal limits  ?CBC WITH DIFFERENTIAL/PLATELET - Abnormal; Notable for the following components:  ? RBC 3.96 (*)   ? Hemoglobin 10.8 (*)   ? HCT 35.7 (*)   ? RDW 16.1 (*)   ? Neutro Abs 8.6 (*)   ? All other components within normal limits  ?URINALYSIS, ROUTINE W REFLEX MICROSCOPIC - Abnormal; Notable for the following components:  ? Color, Urine YELLOW (*)   ? APPearance CLEAR (*)   ? Protein, ur 100 (*)   ? All other components within normal limits  ?CULTURE, BLOOD (ROUTINE X 2)  ?CULTURE, BLOOD (ROUTINE X 2)  ?MRSA NEXT GEN BY PCR, NASAL  ?LACTIC ACID, PLASMA  ?HEMOGLOBIN A1C  ?SEDIMENTATION RATE  ?C-REACTIVE PROTEIN  ?BASIC METABOLIC PANEL  ? ? ? ?EKG ? ? ? ? ?  RADIOLOGY ? ?XR L: I independently viewed and interpreted the images; there is no evidence of fracture or bone destruction to suggest osteomyelitis ? ?PROCEDURES: ? ?Critical Care performed: No ? ?Procedures ? ? ?MEDICATIONS ORDERED IN ED: ?Medications  ?ondansetron (ZOFRAN) tablet 4 mg (has no administration in time range)  ?  Or  ?ondansetron (ZOFRAN) injection 4 mg (has no administration in time range)  ?polyethylene glycol (MIRALAX / GLYCOLAX) packet 17 g (has no administration in time range)  ?acetaminophen (TYLENOL) tablet 650 mg (has no administration in time range)  ?  Or  ?acetaminophen (TYLENOL) suppository 650 mg (has no administration in time range)  ?vancomycin (VANCOREADY) IVPB 1500 mg/300 mL (1,500 mg Intravenous New Bag/Given 08/04/21 1933)  ?insulin aspart (novoLOG) injection 0-20 Units (has no administration in time range)  ?insulin aspart (novoLOG) injection 0-5 Units (has no administration in time range)  ?aspirin EC tablet 81 mg (has no administration in time range)  ?traMADol (ULTRAM) tablet 50 mg (has no administration in time range)  ?losartan (COZAAR)  tablet 100 mg (has no administration in time range)  ?metoprolol tartrate (LOPRESSOR) tablet 75 mg (has no administration in time range)  ?nitroGLYCERIN (NITROSTAT) SL tablet 0.4 mg (has no administration in time range)  ?insulin glargine-yfgn (SEMGLEE) injection 40 Units (has no administration in time range)  ?metFORMIN (GLUCOPHAGE) tablet 1,000 mg (has no administration in time range)  ?tamsulosin (FLOMAX) capsule 0.4 mg (has no administration in time range)  ?apixaban (ELIQUIS) tablet 5 mg (has no administration in time range)  ?ferrous sulfate tablet 325 mg (has no administration in time range)  ?vitamin B-12 (CYANOCOBALAMIN) tablet 10,000 mcg (has no administration in time range)  ?gabapentin (NEURONTIN) capsule 300 mg (has no administration in time range)  ?multivitamin with minerals tablet 1 tablet (has no administration in time range)  ?ascorbic acid (VITAMIN C) tablet 500 mg (has no administration in time range)  ?zinc sulfate capsule 220 mg (has no administration in time range)  ?albuterol (PROVENTIL) (2.5 MG/3ML) 0.083% nebulizer solution 2.5 mg (has no administration in time range)  ?amiodarone (PACERONE) tablet 200 mg (has no administration in time range)  ?diltiazem (CARDIZEM CD) 24 hr capsule 300 mg (has no administration in time range)  ?ezetimibe (ZETIA) tablet 10 mg (has no administration in time range)  ?furosemide (LASIX) tablet 20 mg (has no administration in time range)  ?furosemide (LASIX) tablet 40 mg (has no administration in time range)  ?isosorbide mononitrate (IMDUR) 24 hr tablet 60 mg (has no administration in time range)  ?metoprolol tartrate (LOPRESSOR) injection 5 mg (has no administration in time range)  ?ceFEPIme (MAXIPIME) 2 g in sodium chloride 0.9 % 100 mL IVPB (has no administration in time range)  ?vancomycin (VANCOREADY) IVPB 1750 mg/350 mL (has no administration in time range)  ?ceFEPIme (MAXIPIME) 2 g in sodium chloride 0.9 % 100 mL IVPB (0 g Intravenous Stopped 08/04/21 1820)   ?vancomycin (VANCOCIN) IVPB 1000 mg/200 mL premix (0 mg Intravenous Stopped 08/04/21 1933)  ? ? ? ?IMPRESSION / MDM / ASSESSMENT AND PLAN / ED COURSE  ?I reviewed the triage vital signs and the nursing notes. ? ?66 year old male with PMH as noted above presents with relatively acute worsening redness and swelling in the left lower leg over the last couple of days.  He was referred in by his wound care provider. ? ?I reviewed the past medical records.  The patient was most recently admitted in January.  Per the discharge summary from the hospitalist on 1/17 he  was admitted at that time for worsening bilateral lower extremity edema and possible cellulitis.  Per the wound care note from earlier today, the patient was recommended to come to the ED for likely IV antibiotics given that he recently failed a course of Augmentin and has findings concerning for progressing cellulitis. ? ?On exam the patient is somewhat tachycardic with otherwise normal vital signs.  He has significant edema to the lower extremities, with increased edema, erythema, and induration to the left lower extremity but no purulent drainage from the wound. ? ?Overall presentation is consistent with worsening cellulitis.  Given the relatively rapid progression, larger involved, and the fact that he has recently failed outpatient antibiotics, we will admit for IV antibiotics. ? ?X-ray shows no evidence of osteomyelitis.  We will obtain lab work-up, give antibiotics and reassess. ? ?----------------------------------------- ?7:05 PM on 08/04/2021 ?----------------------------------------- ? ?Lab work-up is overall reassuring.  WBC count is normal.  Lactic acid is normal. ? ?I consulted Dr. Tobie Poet from the hospitalist service; based on her discussion she agrees to admit the patient. ? ? ? ?FINAL CLINICAL IMPRESSION(S) / ED DIAGNOSES  ? ?Final diagnoses:  ?Cellulitis of left lower extremity  ? ? ? ?Rx / DC Orders  ? ?ED Discharge Orders   ? ? None  ? ?   ? ? ? ?Note:  This document was prepared using Dragon voice recognition software and may include unintentional dictation errors.  ?  Arta Silence, MD ?08/04/21 2050 ? ?

## 2021-08-04 NOTE — Assessment & Plan Note (Addendum)
Uncontrolled diabetes mellitus with hyperglycemia and A1c of 10.2. ?CBG remained elevated. ?-Continue with home dose of Lantus 40 units twice daily ?-Add 6 units for mealtime coverage ?-Continue with resistant SSI ?- Goal inpatient blood glucose level is 140-180 ?- Diabetic/carb modified/heart healthy diet ordered ?

## 2021-08-04 NOTE — Assessment & Plan Note (Addendum)
-   Resumed home diltiazem 300 mg daily with breakfast, amiodarone 200 mg daily before breakfast, metoprolol tartrate 75 mg p.o. twice daily ?- Holding home Eliquis until Tuesday afternoon for surgery on Monday. ?

## 2021-08-04 NOTE — Progress Notes (Signed)
Lana, [name] ([id number]) ?Visit Report for 08/04/2021 ?Arrival Information Details ?Patient Name: [name], [name] ?Date of Service: 08/04/2021 1:45 PM ?Medical Record Number: [id number] ?Patient Account Number: [id number] ?Date of Birth/Sex: May 07, 1955 (66 y.o. M) ?Treating RN: Donnamarie Poag ?Primary Care Chardai Gangemi: Fulton Reek Other Clinician: ?Referring Mehkai Gallo: Fulton Reek ?Treating Archit Leger/Extender: Jeri Cos ?Weeks in Treatment: 10 ?Visit Information History Since Last Visit ?Added or deleted any medications: No ?Patient Arrived: Knee Scooter ?Had a fall or experienced change in No ?Arrival Time: 13:54 ?activities of daily living that may affect ?Accompanied By: self ?risk of falls: ?Transfer Assistance: None ?Hospitalized since last visit: No ?Patient Identification Verified: Yes ?Has Dressing in Place as Prescribed: Yes ?Secondary Verification Process Completed: Yes ?Has Compression in Place as Prescribed: Yes ?Patient Requires Transmission-Based No ?Pain Present Now: Yes ?Precautions: ?Patient Has Alerts: Yes ?Patient Alerts: Patient on Blood ?Thinner ?DIABETIC ?Eliquis/Aspirin 81 ?ABI at [facility] 06/14/21 ?L 0.88; R 0.87 ?Electronic Signature(s) ?Signed: 08/04/2021 3:14:37 PM By: Donnamarie Poag ?Entered ByDonnamarie Poag on 08/04/2021 13:54:42 ?[name], [name] ([id number]) ?-------------------------------------------------------------------------------- ?Clinic Level of Care Assessment Details ?Patient Name: DELONTAE, LAMM A. ?Date of Service: 08/04/2021 1:45 PM ?Medical Record Number: [id number] ?Patient Account Number: [id number] ?Date of Birth/Sex: 1955-05-06 (66 y.o. M) ?Treating RN: Donnamarie Poag ?Primary Care Mija Effertz: Fulton Reek Other Clinician: ?Referring Kyrus Hyde: Fulton Reek ?Treating Sabiha Sura/Extender: Jeri Cos ?Weeks in Treatment: 10 ?Clinic Level of Care Assessment Items ?TOOL 4 Quantity Score ?'[]'$  - Use when only an EandM is performed on FOLLOW-UP visit 0 ?ASSESSMENTS - Nursing  Assessment / Reassessment ?X - Reassessment of Co-morbidities (includes updates in patient status) 1 10 ?X- 1 5 ?Reassessment of Adherence to Treatment Plan ?ASSESSMENTS - Wound and Skin Assessment / Reassessment ?'[]'$  - Simple Wound Assessment / Reassessment - one wound 0 ?X- 3 5 ?Complex Wound Assessment / Reassessment - multiple wounds ?X- 1 10 ?Dermatologic / Skin Assessment (not related to wound area) ?ASSESSMENTS - Focused Assessment ?'[]'$  - Circumferential Edema Measurements - multi extremities 0 ?'[]'$  - 0 ?Nutritional Assessment / Counseling / Intervention ?'[]'$  - 0 ?Lower Extremity Assessment (monofilament, tuning fork, pulses) ?'[]'$  - 0 ?Peripheral Arterial Disease Assessment (using hand held doppler) ?ASSESSMENTS - Ostomy and/or Continence Assessment and Care ?'[]'$  - Incontinence Assessment and Management 0 ?'[]'$  - 0 ?Ostomy Care Assessment and Management (repouching, etc.) ?PROCESS - Coordination of Care ?'[]'$  - Simple Patient / Family Education for ongoing care 0 ?X- 1 20 ?Complex (extensive) Patient / Family Education for ongoing care ?X- 1 10 ?Staff obtains Consents, Records, Test Results / Process Orders ?'[]'$  - 0 ?Staff telephones HHA, Nursing Homes / Clarify orders / etc ?'[]'$  - 0 ?Routine Transfer to another Facility (non-emergent condition) ?'[]'$  - 0 ?Routine Hospital Admission (non-emergent condition) ?'[]'$  - 0 ?New Admissions / Biomedical engineer / Ordering NPWT, Apligraf, etc. ?'[]'$  - 0 ?Emergency Hospital Admission (emergent condition) ?'[]'$  - 0 ?Simple Discharge Coordination ?X- 1 15 ?Complex (extensive) Discharge Coordination ?PROCESS - Special Needs ?'[]'$  - Pediatric / Minor Patient Management 0 ?'[]'$  - 0 ?Isolation Patient Management ?'[]'$  - 0 ?Hearing / Language / Visual special needs ?'[]'$  - 0 ?Assessment of Community assistance (transportation, D/C planning, etc.) ?'[]'$  - 0 ?Additional assistance / Altered mentation ?'[]'$  - 0 ?Support Surface(s) Assessment (bed, cushion, seat, etc.) ?INTERVENTIONS - Wound Cleansing /  Measurement ?[name], [name] ([id number]) ?'[]'$  - 0 ?Simple Wound Cleansing - one wound ?X- 3 5 ?Complex Wound Cleansing - multiple wounds ?X- 1 5 ?Wound Imaging (photographs - any number of wounds) ?'[]'$  -  0 ?Wound Tracing (instead of photographs) ?'[]'$  - 0 ?Simple Wound Measurement - one wound ?X- 3 5 ?Complex Wound Measurement - multiple wounds ?INTERVENTIONS - Wound Dressings ?X - Small Wound Dressing one or multiple wounds 2 10 ?X- 1 15 ?Medium Wound Dressing one or multiple wounds ?'[]'$  - 0 ?Large Wound Dressing one or multiple wounds ?X- 1 5 ?Application of Medications - topical ?'[]'$  - 0 ?Application of Medications - injection ?INTERVENTIONS - Miscellaneous ?'[]'$  - External ear exam 0 ?'[]'$  - 0 ?Specimen Collection (cultures, biopsies, blood, body fluids, etc.) ?'[]'$  - 0 ?Specimen(s) / Culture(s) sent or taken to Lab for analysis ?'[]'$  - 0 ?Patient Transfer (multiple staff / Civil Service fast streamer / Similar devices) ?'[]'$  - 0 ?Simple Staple / Suture removal (25 or less) ?'[]'$  - 0 ?Complex Staple / Suture removal (26 or more) ?'[]'$  - 0 ?Hypo / Hyperglycemic Management (close monitor of Blood Glucose) ?'[]'$  - 0 ?Ankle / Brachial Index (ABI) - do not check if billed separately ?X- 1 5 ?Vital Signs ?Has the patient been seen at the hospital within the last three years: Yes ?Total Score: 165 ?Level Of Care: New/Established - Level ?5 ?Electronic Signature(s) ?Signed: 08/04/2021 3:14:37 PM By: Donnamarie Poag ?Entered ByDonnamarie Poag on 08/04/2021 14:30:54 ?[name], [name] ([id number]) ?-------------------------------------------------------------------------------- ?Encounter Discharge Information Details ?Patient Name: [name], [name] ?Date of Service: 08/04/2021 1:45 PM ?Medical Record Number: [id number] ?Patient Account Number: [id number] ?Date of Birth/Sex: 06-08-1955 (66 y.o. M) ?Treating RN: Donnamarie Poag ?Primary Care Suhayla Chisom: Fulton Reek Other Clinician: ?Referring Felis Quillin: Fulton Reek ?Treating Jahmil Macleod/Extender: Jeri Cos ?Weeks  in Treatment: 10 ?Encounter Discharge Information Items ?Discharge Condition: Stable ?Ambulatory Status: Knee Scooter ?Discharge Destination: Emergency Room ?Telephoned: Yes ?Spoke With: pt navigator ?Orders Sent: Yes ?Transportation: Private Auto ?Accompanied By: wife ?Schedule Follow-up Appointment: Yes ?Clinical Summary of Care: ?Electronic Signature(s) ?Signed: 08/04/2021 3:14:37 PM By: Donnamarie Poag ?Entered ByDonnamarie Poag on 08/04/2021 14:33:54 ?[name], [name] ([id number]) ?-------------------------------------------------------------------------------- ?Lower Extremity Assessment Details ?Patient Name: [name], [name] ?Date of Service: 08/04/2021 1:45 PM ?Medical Record Number: [id number] ?Patient Account Number: [id number] ?Date of Birth/Sex: 05-26-1955 (67 y.o. M) ?Treating RN: Donnamarie Poag ?Primary Care Klark Vanderhoef: Fulton Reek Other Clinician: ?Referring Amber Williard: Fulton Reek ?Treating Katasha Riga/Extender: Jeri Cos ?Weeks in Treatment: 10 ?Edema Assessment ?Assessed: [Left: Yes] [Right: Yes] ?[Left: Edema] [Right: :] ?Calf ?Left: Right: ?Point of Measurement: 36 cm From Medial Instep 50 cm 47 cm ?Ankle ?Left: Right: ?Point of Measurement: 12 cm From Medial Instep 29.5 cm 28 cm ?Vascular Assessment ?Pulses: ?Dorsalis Pedis ?Palpable: [Left:Yes] [Right:Yes] ?Electronic Signature(s) ?Signed: 08/04/2021 3:14:37 PM By: Donnamarie Poag ?Entered ByDonnamarie Poag on 08/04/2021 14:10:04 ?[name], [name] ([id number]) ?-------------------------------------------------------------------------------- ?Multi Wound Chart Details ?Patient Name: [name], [name] ?Date of Service: 08/04/2021 1:45 PM ?Medical Record Number: [id number] ?Patient Account Number: [id number] ?Date of Birth/Sex: 1955/11/10 (66 y.o. M) ?Treating RN: Donnamarie Poag ?Primary Care Hatsue Sime: Fulton Reek Other Clinician: ?Referring Lauren Aguayo: Fulton Reek ?Treating Tarron Krolak/Extender: Jeri Cos ?Weeks in Treatment: 10 ?Vital Signs ?Height(in):  73 ?Pulse(bpm): 125 ?Weight(lbs): 371 ?Blood Pressure(mmHg): 163/90 ?Body Mass Index(BMI): 48.9 ?Temperature(??F): 97.7 ?Respiratory Rate(breaths/min): 18 ?Photos: ?Wound Location: Right Lower Leg Right, Prox

## 2021-08-04 NOTE — H&P (Signed)
?History and Physical  ? ?[name]. GYF:[id number] DOB: April 15, 1956 DOA: 08/04/2021 ? ?PCP: Alec Crouch, MD  ?Outpatient Specialists: Dr. Lucky [name], vascular ?Patient coming from: Wound clinic ? ?I have personally briefly reviewed patient's old medical records in [place]. ? ?Chief Concern: Cellulitis sent from wound clinic ? ?HPI: [name] is a 66 year old male with history of hypertension, insulin-dependent diabetes mellitus, morbid obesity (class III), depression, CAD status post CABG in 2006 and PCI to the RCA in 12/2020, chronic bilateral venous stasis on Unna boots, chronic left foot ulcer, COPD, OSA on CPAP nightly, atrial fibrillation, on anticoagulation, PAD status post left first toe amputation presents emergency department for chief from wound clinic for chief concerns of cellulitis failing outpatient therapy. ? ?Vitals in the emergency department show temperature of 98.9, respiration rate of 18, initial heart rate 124 and improved to 121, blood pressure 130/71, SPO2 of 94% on room air. ? ?Serum sodium 138, potassium 4.1, chloride 100, bicarb 28, BUN 22, serum creatinine of 0.87, GFR greater than 60, nonfasting blood glucose 190, WBC 10.5, hemoglobin 10.8, platelets of 203.  Blood cultures x2 have been collected and are pending.  Lactic acid was 1.9. ? ?Left foot x-ray was done in the ED and read as no acute findings.  Evidence of patient's soft tissue wound over the heel without significant change.  No evidence of underlying bone destruction. ? ?ED treatment: Cefepime and vancomycin IV. ? ?At bedside he is able to tell me his name, age, current location and current calendar year. ? ?He reports this is the same wound in January 2023 and it has been worsening. When he was discharged from the hospital in January 2023, it was improving and healing.  Per patient, in his opinion, it got worse when a wound aid/care nurse used Santyl in late January 2023 in the whole wound instead of  the surround wound which caused his wound to worsen. ? ?He denies fever, chills, cough, nausea, vomiting.  He denies chest pain, shortness of breath, abdominal pain, dysuria, diarrhea. ? ?Social history: He is a former tobacco user, quit 8-9 years ago. Previously he smoked 2 ppd. He formerly worked as a Building control surveyor. He drinks a glass of wine every 2-3 months. He denies recreational drug use.  ? ?Vaccination history: He is not vaccinated for covid 19 or influenza.  ? ?ROS: ?Constitutional: no weight change, no fever ?ENT/Mouth: no sore throat, no rhinorrhea ?Eyes: no eye pain, no vision changes ?Cardiovascular: no chest pain, no dyspnea,  no edema, no palpitations ?Respiratory: no cough, no sputum, no wheezing ?Gastrointestinal: no nausea, no vomiting, no diarrhea, no constipation ?Genitourinary: no urinary incontinence, no dysuria, no hematuria ?Musculoskeletal: no arthralgias, no myalgias ?Skin: no skin lesions, no pruritus, ?Neuro: + weakness, no loss of consciousness, no syncope ?Psych: no anxiety, no depression, + decrease appetite ?Heme/Lymph: no bruising, no bleeding ? ?ED Course: Discussed with emergency medicine provider, patient requiring hospitalization for chief concerns of cellulitis failed outpatient therapy. ? ?Assessment/Plan ? ?Principal Problem: ?  Cellulitis ?Active Problems: ?  Essential hypertension, benign ?  CAD (coronary artery disease) ?  Hypersomnia with sleep apnea ?  Long-term insulin use ([place]) ?  Pure hypercholesterolemia ?  Obesity, Class III, BMI 40-49.9 (morbid obesity) ([place]) ?  BPH (benign prostatic hyperplasia) ?  Insulin dependent type 2 diabetes mellitus ([place]) ?  Bilateral lower extremity edema ?  Wound healing, delayed ?  Atrial fibrillation, chronic ([place]) ?  ?Assessment and Plan: ? ?*  Cellulitis ?- Continue vancomycin and cefepime per pharmacy ?- Check sed rate and CRP to monitor antibiotic therapeutic response ?- Blood cultures x2 have been ordered, collected and states in process  per lab section under epic ?- Per patient he uses Dakin's solution 0.25% daily around the perimeter of his wound ?- This has been resumed via pharmacy order ? ?Atrial fibrillation, chronic ([place]) ?- Resumed home diltiazem 300 mg daily with breakfast, amiodarone 200 mg daily before breakfast, metoprolol tartrate 75 mg p.o. twice daily ?- Apixaban 5 mg p.o. twice daily resumed ? ?Wound healing, delayed ?- Podiatry, Dr. Luana [name] has been consulted via staff message and epic order placed for possible debridement ? ?Insulin dependent type 2 diabetes mellitus ([place]) ?- Resumed home long-acting insulin, glargine 40 units subcutaneous twice daily, metformin 1000 mg p.o. twice daily ?- Home insulin sliding scale has not been continued ?- Insulin SSI with at bedtime coverage, obesity/insulin resistant dosing ordered ?- Goal inpatient blood glucose level is 140-180 ?- Diabetic/carb modified/heart healthy diet ordered ? ?BPH (benign prostatic hyperplasia) ?- Resumed home tamsulosin 0.4 mg p.o. daily ? ?Pure hypercholesterolemia ?- Home ezetimibe 10 mg nightly resumed ? ?Hypersomnia with sleep apnea ?- CPAP nightly ordered ?- He endorses compliance of cpap use ? ?CAD (coronary artery disease) ?- Aspirin 81 mg daily, ezetimibe 10 mg nightly, Imdur 60 mg p.o. twice daily resumed ?- Home nitroglycerin sublingual every 5 minutes as needed for chest pain resumed ? ?Essential hypertension, benign ?- Home losartan 100 mg daily resumed for 08/05/2021 ?- Metoprolol tartrate 75 mg p.o. twice daily resumed ? ?AF (paroxysmal atrial fibrillation) (HCC)-resolved as of 08/04/2021 ?- Resumed home Eliquis 5 mg p.o. twice daily ?- Resumed home diltiazem 300 mg daily before breakfast, amiodarone 200 mg daily before breakfast, metoprolol tartrate 75 mg p.o. twice daily ? ?Chart reviewed.  ? ?Hospitalization from 05/08/2021 to 05/16/2021: For anasarca.  He was also treated for bilateral lower extremity cellulitis with vancomycin, cefepime, Flagyl and was  transition to Augmentin for 7 days, to complete course on 05/17/2021. He was treated for Staph hominis bacteremia, presumed contaminant as was only seen in 1 of 4 blood culture bottles.  He was also treated for acute on chronic diastolic heart failure, repeat echo showed ejection fraction estimated at 50 to 55%, patient was diuresed in the hospital. ? ?DVT prophylaxis: Eliquis ?Code Status: full code  ?Diet: Heart healthy/carb modified ?Family Communication: He states his wife already knows that he is in the hospital ?Disposition Plan: Pending clinical course ?Consults called: wound nurse ?Admission status: Telemetry medical, observation ? ?Past Medical History:  ?Diagnosis Date  ? Arrhythmia   ? atrial fibrillation  ? Asthma   ? CHF (congestive heart failure) ([place])   ? COPD (chronic obstructive pulmonary disease) ([place])   ? Coronary artery disease   ? Depression   ? Diabetes mellitus without complication ([place])   ? Gout   ? History anabolic steroid use   ? Hyperlipidemia   ? Hypertension   ? Hypogonadism in male   ? Morbid obesity ([place])   ? Myocardial infarction [facility])   ? Peripheral vascular disease ([place])   ? Perirectal abscess   ? Pleurisy   ? Sleep apnea   ? CPAP at night, no oxygen  ? Varicella   ? ?Past Surgical History:  ?Procedure Laterality Date  ? ABDOMINAL AORTIC ANEURYSM REPAIR    ? ACHILLES TENDON SURGERY Left 01/10/2021  ? Procedure: ACHILLES LENGTHENING/KIDNER;  Surgeon: Caroline More, DPM;  Location: [facility] ORS;  Service: Podiatry;  Laterality: Left;  ? AMPUTATION TOE Right 02/10/2016  ? Procedure: AMPUTATION TOE 3RD TOE;  Surgeon: Samara Deist, DPM;  Location: [facility] ORS;  Service: Podiatry;  Laterality: Right;  ? AMPUTATION TOE Left 02/24/2020  ? Procedure: AMPUTATION TOE;  Surgeon: Caroline More, DPM;  Location: [facility] ORS;  Service: Podiatry;  Laterality: Left;  ? APPLICATION OF WOUND VAC Left 02/29/2020  ? Procedure: APPLICATION OF WOUND VAC;  Surgeon: Caroline More, DPM;  Location: [facility] ORS;  Service:  Podiatry;  Laterality: Left;  ? COLONOSCOPY WITH PROPOFOL [name] 11/18/2015  ? Procedure: COLONOSCOPY WITH PROPOFOL;  Surgeon: Manya Silvas, MD;  Location: [facility] ENDOSCOPY;  Service: Endoscopy;  Laterality: [name];  ?

## 2021-08-05 DIAGNOSIS — E11628 Type 2 diabetes mellitus with other skin complications: Secondary | ICD-10-CM | POA: Diagnosis not present

## 2021-08-05 DIAGNOSIS — G4710 Hypersomnia, unspecified: Secondary | ICD-10-CM | POA: Diagnosis present

## 2021-08-05 DIAGNOSIS — L03116 Cellulitis of left lower limb: Secondary | ICD-10-CM | POA: Diagnosis present

## 2021-08-05 DIAGNOSIS — E1142 Type 2 diabetes mellitus with diabetic polyneuropathy: Secondary | ICD-10-CM | POA: Diagnosis present

## 2021-08-05 DIAGNOSIS — I878 Other specified disorders of veins: Secondary | ICD-10-CM

## 2021-08-05 DIAGNOSIS — E7800 Pure hypercholesterolemia, unspecified: Secondary | ICD-10-CM | POA: Diagnosis present

## 2021-08-05 DIAGNOSIS — I10 Essential (primary) hypertension: Secondary | ICD-10-CM | POA: Diagnosis not present

## 2021-08-05 DIAGNOSIS — I890 Lymphedema, not elsewhere classified: Secondary | ICD-10-CM | POA: Diagnosis present

## 2021-08-05 DIAGNOSIS — J449 Chronic obstructive pulmonary disease, unspecified: Secondary | ICD-10-CM | POA: Diagnosis present

## 2021-08-05 DIAGNOSIS — S91302A Unspecified open wound, left foot, initial encounter: Secondary | ICD-10-CM | POA: Diagnosis not present

## 2021-08-05 DIAGNOSIS — I252 Old myocardial infarction: Secondary | ICD-10-CM | POA: Diagnosis not present

## 2021-08-05 DIAGNOSIS — E1165 Type 2 diabetes mellitus with hyperglycemia: Secondary | ICD-10-CM | POA: Diagnosis present

## 2021-08-05 DIAGNOSIS — B964 Proteus (mirabilis) (morganii) as the cause of diseases classified elsewhere: Secondary | ICD-10-CM | POA: Diagnosis present

## 2021-08-05 DIAGNOSIS — Z6841 Body Mass Index (BMI) 40.0 and over, adult: Secondary | ICD-10-CM | POA: Diagnosis not present

## 2021-08-05 DIAGNOSIS — L97509 Non-pressure chronic ulcer of other part of unspecified foot with unspecified severity: Secondary | ICD-10-CM | POA: Diagnosis not present

## 2021-08-05 DIAGNOSIS — I2510 Atherosclerotic heart disease of native coronary artery without angina pectoris: Secondary | ICD-10-CM | POA: Diagnosis present

## 2021-08-05 DIAGNOSIS — L97424 Non-pressure chronic ulcer of left heel and midfoot with necrosis of bone: Secondary | ICD-10-CM | POA: Diagnosis present

## 2021-08-05 DIAGNOSIS — E119 Type 2 diabetes mellitus without complications: Secondary | ICD-10-CM | POA: Diagnosis not present

## 2021-08-05 DIAGNOSIS — M86162 Other acute osteomyelitis, left tibia and fibula: Secondary | ICD-10-CM | POA: Diagnosis not present

## 2021-08-05 DIAGNOSIS — N400 Enlarged prostate without lower urinary tract symptoms: Secondary | ICD-10-CM | POA: Diagnosis not present

## 2021-08-05 DIAGNOSIS — E1169 Type 2 diabetes mellitus with other specified complication: Secondary | ICD-10-CM | POA: Diagnosis present

## 2021-08-05 DIAGNOSIS — I5032 Chronic diastolic (congestive) heart failure: Secondary | ICD-10-CM | POA: Diagnosis present

## 2021-08-05 DIAGNOSIS — Z28310 Unvaccinated for covid-19: Secondary | ICD-10-CM | POA: Diagnosis not present

## 2021-08-05 DIAGNOSIS — M86172 Other acute osteomyelitis, left ankle and foot: Secondary | ICD-10-CM | POA: Diagnosis present

## 2021-08-05 DIAGNOSIS — E11621 Type 2 diabetes mellitus with foot ulcer: Secondary | ICD-10-CM | POA: Diagnosis present

## 2021-08-05 DIAGNOSIS — I110 Hypertensive heart disease with heart failure: Secondary | ICD-10-CM | POA: Diagnosis present

## 2021-08-05 DIAGNOSIS — F17290 Nicotine dependence, other tobacco product, uncomplicated: Secondary | ICD-10-CM | POA: Diagnosis present

## 2021-08-05 DIAGNOSIS — I480 Paroxysmal atrial fibrillation: Secondary | ICD-10-CM | POA: Diagnosis present

## 2021-08-05 DIAGNOSIS — E1152 Type 2 diabetes mellitus with diabetic peripheral angiopathy with gangrene: Secondary | ICD-10-CM | POA: Diagnosis present

## 2021-08-05 DIAGNOSIS — B9689 Other specified bacterial agents as the cause of diseases classified elsewhere: Secondary | ICD-10-CM | POA: Diagnosis present

## 2021-08-05 DIAGNOSIS — I482 Chronic atrial fibrillation, unspecified: Secondary | ICD-10-CM | POA: Diagnosis not present

## 2021-08-05 DIAGNOSIS — L089 Local infection of the skin and subcutaneous tissue, unspecified: Secondary | ICD-10-CM | POA: Diagnosis not present

## 2021-08-05 LAB — BASIC METABOLIC PANEL
Anion gap: 7 (ref 5–15)
BUN: 25 mg/dL — ABNORMAL HIGH (ref 8–23)
CO2: 27 mmol/L (ref 22–32)
Calcium: 8 mg/dL — ABNORMAL LOW (ref 8.9–10.3)
Chloride: 100 mmol/L (ref 98–111)
Creatinine, Ser: 1.13 mg/dL (ref 0.61–1.24)
GFR, Estimated: >60 mL/min (ref >60)
Glucose, Bld: 250 mg/dL — ABNORMAL HIGH (ref 70–99)
Potassium: 4.1 mmol/L (ref 3.5–5.1)
Sodium: 134 mmol/L — ABNORMAL LOW (ref 135–145)

## 2021-08-05 LAB — HEMOGLOBIN A1C
Hgb A1c MFr Bld: 10.2 % — ABNORMAL HIGH (ref 4.8–5.6)
Mean Plasma Glucose: 246.04 mg/dL

## 2021-08-05 LAB — GLUCOSE, CAPILLARY
Glucose-Capillary: 196 mg/dL — ABNORMAL HIGH (ref 70–99)
Glucose-Capillary: 192 mg/dL — ABNORMAL HIGH (ref 70–99)
Glucose-Capillary: 187 mg/dL — ABNORMAL HIGH (ref 70–99)
Glucose-Capillary: 163 mg/dL — ABNORMAL HIGH (ref 70–99)

## 2021-08-05 LAB — C-REACTIVE PROTEIN: CRP: 17.1 mg/dL — ABNORMAL HIGH (ref <1.0)

## 2021-08-05 LAB — MRSA NEXT GEN BY PCR, NASAL: MRSA by PCR Next Gen: NOT DETECTED

## 2021-08-05 MED ORDER — POVIDONE-IODINE 5 % EX SOLN: Freq: Once | CUTANEOUS | Status: DC

## 2021-08-05 MED ORDER — INSULIN ASPART 100 UNIT/ML IJ SOLN
6.0000 [IU] | Freq: Three times a day (TID) | INTRAMUSCULAR | Status: DC
  Administered 2021-08-05 – 2021-08-06 (×3): 6 [IU] via SUBCUTANEOUS
  Filled 2021-08-05 (×3): qty 1

## 2021-08-05 NOTE — Assessment & Plan Note (Signed)
Patient has an history of chronic venous stasis of lower extremities. ?-Continue home dose of Lasix 40 mg daily ?-Continue with Ace wrap ?

## 2021-08-05 NOTE — TOC Progression Note (Signed)
Transition of Care (TOC) - Progression Note  ? ? ?Patient Details  ?Name: [name]. ?MRN: [id number] ?Date of Birth: [birth year] ? ?Transition of Care (TOC) CM/SW Contact  ?Maleka Contino A Chad Tiznado, LCSW ?Phone Number: ?08/05/2021, 2:17 PM ? ?Clinical Narrative:   Pt is active with [facility] and they will resume services at d/c. Will need HH orders prior to dc. ? ? ? ?  ?  ? ?Expected Discharge Plan and Services ?  ?  ?  ?  ?  ?                ?  ?  ?  ?  ?  ?  ?  ?  ?  ?  ? ? ?Social Determinants of Health (SDOH) Interventions ?  ? ?Readmission Risk Interventions ?   ? View : No data to display.  ?  ?  ?  ? ? ?

## 2021-08-05 NOTE — Progress Notes (Signed)
Cpap not yet initiated. Pt gone from rm. ?

## 2021-08-05 NOTE — Progress Notes (Signed)
?Progress Note ? ? ?Patient: [name]. ELF:[id number] DOB: 1955-06-09 DOA: 08/04/2021     0 ?DOS: the patient was seen and examined on 08/05/2021 ?  ?Brief hospital course: ?[name] is a 66 year old male with history of hypertension, insulin-dependent diabetes mellitus, morbid obesity (class III), depression, CAD status post CABG in 2006 and PCI to the RCA in 12/2020, chronic bilateral venous stasis on Unna boots, chronic left foot ulcer, COPD, OSA on CPAP nightly, atrial fibrillation, on anticoagulation, PAD status post left first toe amputation presents emergency department for chief from wound clinic for chief concerns of cellulitis failing outpatient therapy. ? ?Vitals in the emergency department show temperature of 98.9, respiration rate of 18, initial heart rate 124 and improved to 121, blood pressure 130/71, SPO2 of 94% on room air. ? ?Serum sodium 138, potassium 4.1, chloride 100, bicarb 28, BUN 22, serum creatinine of 0.87, GFR greater than 60, nonfasting blood glucose 190, WBC 10.5, hemoglobin 10.8, platelets of 203.  Blood cultures x2 have been collected and are pending.  Lactic acid was 1.9. ? ?Left foot x-ray was done in the ED and read as no acute findings.  Evidence of patient's soft tissue wound over the heel without significant change.  No evidence of underlying bone destruction. ? ?ED treatment: Cefepime and vancomycin IV. ? ?4/8: Patient with no complaints.  MRI left ankle was ordered by podiatry which shows some concern of early osteomyelitis.  Wound was not very impressive for active infection, a lot of dead tissue with mild surrounding erythema, minimum serous discharge. ?Appropriate to stop antibiotics and obtained a wound/deep wound culture so we can treat appropriately.  Discussed with podiatry who will proceed with wound debridement and bone culture on Monday, we will stop antibiotics at this time and will restart after getting the tissue. ?We will continue with wound  care. ?Patient is high risk for BKA. ?We will involve infectious disease on Monday. ?Holding Eliquis for potential surgery on Monday, can resume on Tuesday afternoon per podiatry. ? ? ?Assessment and Plan: ?* Cellulitis ?Patient has chronic nonhealing wound, failed multiple courses of antibiotics. ?CRP and ESR elevated.  Blood cultures negative in 24 hours.  Clinically looks like a lot of dead tissue with minimum erythema and serous discharge, not very impressive for acute infection.  MRI with concern of osteomyelitis.  Podiatry is planning debridement on Monday. ?High risk for BKA.  He was placed on vancomycin and cefepime on admission ?-Stop antibiotics for now and we will restart after getting deep tissue and bone biopsy for culture on Monday, patient currently medically stable ?- Per patient he uses Dakin's solution 0.25% daily around the perimeter of his wound ?- Wound care was consulted- continue with their recommendations. ?-Continue to monitor, if becomes febrile we can restart antibiotics ? ?Wound healing, delayed ?Podiatry is on board, going for wound debridement on Monday. ?Please see above note ? ?Insulin dependent type 2 diabetes mellitus ([place]) ?Uncontrolled diabetes mellitus with hyperglycemia and A1c of 10.2. ?CBG remained elevated. ?-Continue with home dose of Lantus 40 units twice daily ?-Add 6 units for mealtime coverage ?-Continue with resistant SSI ?- Goal inpatient blood glucose level is 140-180 ?- Diabetic/carb modified/heart healthy diet ordered ? ?Essential hypertension, benign ?Blood pressure within goal. ?- Home losartan 100 mg daily resumed for 08/05/2021 ?- Metoprolol tartrate 75 mg p.o. twice daily resumed ? ?CAD (coronary artery disease) ?No chest pain. ?- Aspirin 81 mg daily, ezetimibe 10 mg nightly, Imdur 60 mg p.o. twice  daily resumed ?- Home nitroglycerin sublingual every 5 minutes as needed for chest pain resumed ? ?Hypersomnia with sleep apnea ?- CPAP nightly ordered ?- He endorses  compliance of cpap use ? ?Pure hypercholesterolemia ?- Continue with Home ezetimibe 10 mg nightly  ? ?Obesity, Class III, BMI 40-49.9 (morbid obesity) ([place]) ?Estimated body mass index is 46.03 kg/m? as calculated from the following: ?  Height as of this encounter: 6' 4.5" (1.943 m). ?  Weight as of this encounter: 173.8 kg.  ? ?-This will affect overall prognosis. ?-Counseling was provided. ? ?BPH (benign prostatic hyperplasia) ?- Continue with home tamsulosin 0.4 mg p.o. daily ? ?Atrial fibrillation, chronic ([place]) ?- Resumed home diltiazem 300 mg daily with breakfast, amiodarone 200 mg daily before breakfast, metoprolol tartrate 75 mg p.o. twice daily ?- Holding home Eliquis until Tuesday afternoon for surgery on Monday. ? ?Venous stasis of both lower extremities ?Patient has an history of chronic venous stasis of lower extremities. ?-Continue home dose of Lasix 40 mg daily ?-Continue with Ace wrap ? ?AF (paroxysmal atrial fibrillation) (HCC)-resolved as of 08/04/2021 ?- Resumed home Eliquis 5 mg p.o. twice daily ?- Resumed home diltiazem 300 mg daily before breakfast, amiodarone 200 mg daily before breakfast, metoprolol tartrate 75 mg p.o. twice daily ?  ?Subjective: Patient was seen and examined today.  No new complaints.  He was very concerned about his nonhealing wound, stating that wound was getting worse for the past 2 weeks.  Per patient he was compliant with all of his medications. ? ?Physical Exam: ?Vitals:  ? 08/05/21 0012 08/05/21 0537 08/05/21 0803 08/05/21 1116  ?BP: 127/66 97/65 106/66 106/72  ?Pulse: (!) 118 (!) 116 (!) 115 (!) 117  ?Resp: _0 ?Temp: 100 ?F (37.8 ?C) 98.5 ?F (36.9 ?C) 98 ?F (36.7 ?C) 98.4 ?F (36.9 ?C)  ?TempSrc: Oral Oral Oral Oral  ?SpO2: 91% 95% 93% 93%  ?Weight:      ?Height:      ? ?General.  Morbidly obese gentleman, in no acute distress. ?Pulmonary.  Lungs clear bilaterally, normal respiratory effort. ?CV.  Regular rate and rhythm, no JVD, rub or murmur. ?Abdomen.  Soft,  nontender, nondistended, BS positive. ?CNS.  Alert and oriented .  No focal neurologic deficit. ?Extremities.  2+ LE edema, bilateral lower extremity wrapped with Ace Wrap. ?Psychiatry.  Judgment and insight appears normal. ? ? ?. ? ? ? ? ?Data Reviewed: ?Prior notes, labs and images reviewed. ? ?Family Communication: Discussed with patient ? ?Disposition: ?Status is: Inpatient ?Remains inpatient appropriate because: Severity of illness ? ? Planned Discharge Destination: To be determined ? ? ?Time spent: 50 minutes ? ?This record has been created using Systems analyst. Errors have been sought and corrected,but may not always be located. Such creation errors do not reflect on the standard of care. ? ?Author: ?Lorella Nimrod, MD ?08/05/2021 12:58 PM ? ?For on call review [email or web address].  ?

## 2021-08-05 NOTE — Evaluation (Signed)
Physical Therapy Evaluation ?Patient Details ?Name: [name]. ?MRN: [id number] ?DOB: 1955-07-02 ?Today's Date: 08/05/2021 ? ?History of Present Illness ? Pt is a 66 y.o. male presenting to hospital 4/7 from wound clinic d/t concern of cellulitis and failing outpatient therapy.  Pt admitted with cellulitis, chronic a-fib, delayed wound healing (L heel).  PMH includes htn, IDDM, morbid obesity, depression, CAD s/p CABG 2006 and PCI to RCA in 12/2020, chronic B venous stasis on Unna boots, chronic L foot ulcer, COPD, OSA on CPAP nightly, ai-fib, PAD, s/p multiple toe amputations.  ?Clinical Impression ? Prior to hospital admission, pt was modified independent using knee scooter (was also using bariatric RW within home for past week at times as well); lives with his wife in 1 level home (level entry from side door).  Currently pt is modified independent semi-supine to sitting edge of bed; and CGA with transfers bed to recliner with bariatric RW use (see below for details).  Pt would benefit from skilled PT to address noted impairments and functional limitations (see below for any additional details).  Upon hospital discharge, pt would benefit from [place].   ? ?Recommendations for follow up therapy are one component of a multi-disciplinary discharge planning process, led by the attending physician.  Recommendations may be updated based on patient status, additional functional criteria and insurance authorization. ? ?Follow Up Recommendations Home health PT ? ?  ?Assistance Recommended at Discharge Intermittent Supervision/Assistance  ?Patient can return home with the following ? A little help with walking and/or transfers;A little help with bathing/dressing/bathroom;Assistance with cooking/housework;Assist for transportation;Help with stairs or ramp for entrance ? ?  ?Equipment Recommendations Other (comment) (pt has needed DME at home already)  ?Recommendations for Other Services ?    ?  ?Functional Status  Assessment Patient has had a recent decline in their functional status and demonstrates the ability to make significant improvements in function in a reasonable and predictable amount of time.  ? ?  ?Precautions / Restrictions Precautions ?Precautions: Fall ?Restrictions ?Weight Bearing Restrictions: Yes ?LLE Weight Bearing: Non weight bearing ?Other Position/Activity Restrictions: NWB L LE (except toe touch transfers L LE)  ? ?  ? ?Mobility ? Bed Mobility ?Overal bed mobility: Modified Independent ?  ?  ?  ?  ?  ?  ?General bed mobility comments: Semi-supine to sitting edge of bed ?  ? ?Transfers ?Overall transfer level: Needs assistance ?Equipment used: Rolling walker (2 wheels) (bariatric) ?Transfers: Sit to/from Stand, Bed to chair/wheelchair/BSC ?Sit to Stand: Min guard ?  ?Step pivot transfers: Min guard ?  ?  ?  ?General transfer comment: bed height elevated to stand from bed x1 trial; x1 trial standing from recliner (use of B armrests); initial vc's for WB'ing status for transfers (stand step turn with walker and toe touch L LE) ?  ? ?Ambulation/Gait ?  ?  ?  ?  ?  ?  ?  ?General Gait Details: Deferred d/t concerns of pt being able to maintain [place] status ? ?Stairs ?  ?  ?  ?  ?  ? ?Wheelchair Mobility ?  ? ?Modified Rankin (Stroke Patients Only) ?  ? ?  ? ?Balance Overall balance assessment: Needs assistance ?Sitting-balance support: No upper extremity supported, Feet supported ?Sitting balance-Leahy Scale: Normal ?Sitting balance - Comments: steady sitting reaching outside BOS ?  ?Standing balance support: Bilateral upper extremity supported, Reliant on assistive device for balance ?Standing balance-Leahy Scale: Good ?Standing balance comment: steady with B UE support on RW ?  ?  ?  ?  ?  ?  ?  ?  ?  ?  ?  ?   ? ? ? ?  Pertinent Vitals/Pain Pain Assessment ?Pain Assessment: Faces ?Faces Pain Scale: Hurts a little bit ?Pain Location: L heel ?Pain Descriptors / Indicators: Sore ?Pain Intervention(s): Limited  activity within patient's tolerance, Monitored during session, Repositioned  ? ? ?Home Living Family/patient expects to be discharged to:: Private residence ?Living Arrangements: Spouse/significant other ?Available Help at Discharge: Family ?Type of Home: House ?Home Access: Level entry (from side door level entry (otherwise 4 STE B railing)) ?  ?  ?  ?Home Layout: One level ?Home Equipment: Conservation officer, nature (2 wheels);Cane - quad;Shower seat - built in ?Additional Comments: Bariatric RW; knee scooter  ?  ?Prior Function Prior Level of Function : Independent/Modified Independent ?  ?  ?  ?  ?  ?  ?Mobility Comments: Pt had been walking around on L foot for the past week d/t helping his wife prepare for a party (pt either walking with walker or using knee scooter); prior to past week, pt mostly utilizing knee scooter; always uses knee scooter in community ?  ?  ? ? ?Hand Dominance  ?   ? ?  ?Extremity/Trunk Assessment  ? Upper Extremity Assessment ?Upper Extremity Assessment: Overall WFL for tasks assessed ?  ? ?Lower Extremity Assessment ?Lower Extremity Assessment:  (at least 3/5 AROM hip flexion, knee flexion/extension, and DF/PF) ?  ? ?Cervical / Trunk Assessment ?Cervical / Trunk Assessment: Other exceptions ?Cervical / Trunk Exceptions: forward head/shoulders  ?Communication  ? Communication: No difficulties  ?Cognition Arousal/Alertness: Awake/alert ?Behavior During Therapy: [facility] for tasks assessed/performed ?Overall Cognitive Status: Within Functional Limits for tasks assessed ?  ?  ?  ?  ?  ?  ?  ?  ?  ?  ?  ?  ?  ?  ?  ?  ?  ?  ?  ? ?  ?General Comments  Nursing cleared pt for participation in physical therapy.  Pt agreeable to PT session. ? ?  ?Exercises    ? ?Assessment/Plan  ?  ?PT Assessment Patient needs continued PT services  ?PT Problem List Decreased strength;Decreased mobility;Pain ? ?   ?  ?PT Treatment Interventions DME instruction;Functional mobility training;Therapeutic activities;Therapeutic  exercise;Balance training;Patient/family education   ? ?PT Goals (Current goals can be found in the Care Plan section)  ?Acute Rehab PT Goals ?Patient Stated Goal: to go home ?PT Goal Formulation: With patient ?Time For Goal Achievement: 08/19/21 ?Potential to Achieve Goals: Good ? ?  ?Frequency Min 2X/week ?  ? ? ?Co-evaluation   ?  ?  ?  ?  ? ? ?  ?AM-PAC PT "6 Clicks" Mobility  ?Outcome Measure   ?Help needed moving from lying on your back to sitting on the side of a flat bed without using bedrails?: None ?Help needed moving to and from a bed to a chair (including a wheelchair)?: A Little ?Help needed standing up from a chair using your arms (e.g., wheelchair or bedside chair)?: A Little ?Help needed to walk in hospital room?: Total ?Help needed climbing 3-5 steps with a railing? : Total ?6 Click Score: 12 ? ?  ?End of Session Equipment Utilized During Treatment: Gait belt ?Activity Tolerance: Patient tolerated treatment well ?Patient left: in chair;with call bell/phone within reach;with chair alarm set;Other (comment) (B LE's elevated on pillows) ?Nurse Communication: Mobility status;Precautions;Weight bearing status ?PT Visit Diagnosis: Other abnormalities of gait and mobility (R26.89);Muscle weakness (generalized) (M62.81);Pain ?Pain - Right/Left: Left ?Pain - part of body: Ankle and joints of foot ?  ? ?Time: 7616-0737 ?PT Time Calculation (min) (  ACUTE ONLY): 30 min ? ? ?Charges:   PT Evaluation ?$PT Eval Low Complexity: 1 Low ?PT Treatments ?$Therapeutic Activity: 8-22 mins ?  ?   ? ?Leitha Bleak, PT ?08/05/21, 1:08 PM ? ? ?

## 2021-08-05 NOTE — Assessment & Plan Note (Signed)
Estimated body mass index is 46.03 kg/m? as calculated from the following: ?  Height as of this encounter: 6' 4.5" (1.943 m). ?  Weight as of this encounter: 173.8 kg.  ? ?-This will affect overall prognosis. ?-Counseling was provided. ?

## 2021-08-05 NOTE — Consult Note (Signed)
WOC Nurse Consult Note: ?Reason for Consult:Left heel ulceration, full thickness, chronic, nonhealing. Patient has been followed closely by Outpatient wound care center and was seen yesterday and referred to ED. Also followed by Podiatric Medicine (Dr. Luana Shu) and Vascular Surgery (Dr. Lucky Cowboy). ?Wound type:Neuropathic ?Pressure Injury POA: [name] ?Measurement:Left heel: 3.7cm x 5cm x 1.2cm (taken yesterday by outpatient [facility]) ?Wound GHW:EXHB red, nonviable, dry ?Drainage (amount, consistency, odor) small serous ?Periwound: callus, peeling, erythema ?Dressing procedure/placement/frequency: ?Patient with recent ABIs in Vascular setting indicating adequate perfusion.  I have communicated to Dr. Reesa Chew via [name] this morning that I recommend consultation with the specialists already involved and overseeing his care and she indicates that Podiatry is already on board. ?I will provide conservative care orders for Nursing until Podiatry sees using soap and water cleanse, rinse and dry followed by application of folded pieces of antimicrobial nonadherent gauze (xeroform) topped with dry gauze, secured with Kerlix roll gauze/paper tape. Heels are to be floated using Prevalon boots and a sacral foam dressing used to PI prevention to sacrum.  ? ?Any orders from a physician shall supercede mine. ? ?[place] nursing team will not follow, but will remain available to this patient, the nursing and medical teams.  Please re-consult if needed. ?Thanks, ?Maudie Flakes, MSN, RN, [place], [place], CWON-AP, [place]  ?Pager# [phone]  ? ? ?  ?

## 2021-08-05 NOTE — Consult Note (Signed)
PODIATRY / FOOT AND ANKLE SURGERY CONSULTATION NOTE ? ?Requesting Physician: Dr. Reesa Chew ? ?Reason for consult: Left heel wound ? ?Chief Complaint: Left heel wound/infection ?  ?HPI: [name]. is a 66 y.o. male who presents with chronic wounds to bilateral lower extremities.  Patient presented more so due to the left plantar heel wound which is worsened over the past couple of weeks.  Patient has been taking Augmentin and has been seeing the wound care center weekly for this issue.  He notes that the wound worsened when he was up on his feet more over the past week or so despite instructions to try to stay nonweightbearing at all times to the left lower extremity.  He notes he has made a modification to his shoe to try to take pressure off of his heel but still continues to get worsening changes to his heel ulcer.  Patient was seen in the wound care center on Friday and was instructed to go to the emergency room for admission to the hospital due to concerns for infection and possible osteomyelitis.  X-ray imaging was taken which did not show any bone infection but I ordered an MRI for the patient and it did come back positive for osteomyelitis that appeared to be fairly acute to a significant portion of the calcaneus.  Patient presents today resting in bed comfortably with minimal pain discomfort to the left lower extremity.  Patient currently denies nausea, vomiting, fevers, chills. ? ?PMHx:  ?Past Medical History:  ?Diagnosis Date  ? Arrhythmia   ? atrial fibrillation  ? Asthma   ? CHF (congestive heart failure) ([place])   ? COPD (chronic obstructive pulmonary disease) ([place])   ? Coronary artery disease   ? Depression   ? Diabetes mellitus without complication ([place])   ? Gout   ? History anabolic steroid use   ? Hyperlipidemia   ? Hypertension   ? Hypogonadism in male   ? Morbid obesity ([place])   ? Myocardial infarction [facility])   ? Peripheral vascular disease ([place])   ? Perirectal abscess   ? Pleurisy   ? Sleep  apnea   ? CPAP at night, no oxygen  ? Varicella   ? ? ?Surgical Hx:  ?Past Surgical History:  ?Procedure Laterality Date  ? ABDOMINAL AORTIC ANEURYSM REPAIR    ? ACHILLES TENDON SURGERY Left 01/10/2021  ? Procedure: ACHILLES LENGTHENING/KIDNER;  Surgeon: Caroline More, DPM;  Location: [facility] ORS;  Service: Podiatry;  Laterality: Left;  ? AMPUTATION TOE Right 02/10/2016  ? Procedure: AMPUTATION TOE 3RD TOE;  Surgeon: Samara Deist, DPM;  Location: [facility] ORS;  Service: Podiatry;  Laterality: Right;  ? AMPUTATION TOE Left 02/24/2020  ? Procedure: AMPUTATION TOE;  Surgeon: Caroline More, DPM;  Location: [facility] ORS;  Service: Podiatry;  Laterality: Left;  ? APPLICATION OF WOUND VAC Left 02/29/2020  ? Procedure: APPLICATION OF WOUND VAC;  Surgeon: Caroline More, DPM;  Location: [facility] ORS;  Service: Podiatry;  Laterality: Left;  ? COLONOSCOPY WITH PROPOFOL [name] 11/18/2015  ? Procedure: COLONOSCOPY WITH PROPOFOL;  Surgeon: Manya Silvas, MD;  Location: [facility] ENDOSCOPY;  Service: Endoscopy;  Laterality: [name];  ? CORONARY ARTERY BYPASS GRAFT    ? CORONARY STENT INTERVENTION [name] 02/02/2020  ? Procedure: CORONARY STENT INTERVENTION;  Surgeon: Isaias Cowman, MD;  Location: [place] CV LAB;  Service: Cardiovascular;  Laterality: [name];  ? IRRIGATION AND DEBRIDEMENT FOOT Left 02/29/2020  ? Procedure: IRRIGATION AND DEBRIDEMENT FOOT;  Surgeon: Caroline More, DPM;  Location: [facility]  ORS;  Service: Podiatry;  Laterality: Left;  ? IRRIGATION AND DEBRIDEMENT FOOT Left 02/24/2020  ? Procedure: IRRIGATION AND DEBRIDEMENT FOOT;  Surgeon: Caroline More, DPM;  Location: [facility] ORS;  Service: Podiatry;  Laterality: Left;  ? KNEE ARTHROSCOPY    ? LEFT HEART CATH AND CORS/GRAFTS ANGIOGRAPHY [name] 02/02/2020  ? Procedure: LEFT HEART CATH AND CORS/GRAFTS ANGIOGRAPHY;  Surgeon: Teodoro Spray, MD;  Location: [place] CV LAB;  Service: Cardiovascular;  Laterality: [name];  ? LOWER EXTREMITY ANGIOGRAPHY Left 02/25/2020  ? Procedure: Lower Extremity  Angiography;  Surgeon: Algernon Huxley, MD;  Location: [place] [facility] LAB;  Service: Cardiovascular;  Laterality: Left;  ? LOWER EXTREMITY ANGIOGRAPHY Left 01/04/2021  ? Procedure: LOWER EXTREMITY ANGIOGRAPHY;  Surgeon: Algernon Huxley, MD;  Location: [place] [facility] LAB;  Service: Cardiovascular;  Laterality: Left;  ? METATARSAL HEAD EXCISION Left 01/10/2021  ? Procedure: METATARSAL HEAD EXCISION - LEFT 5th;  Surgeon: Caroline More, DPM;  Location: [facility] ORS;  Service: Podiatry;  Laterality: Left;  ? PERIPHERAL VASCULAR CATHETERIZATION Right 01/24/2016  ? Procedure: Lower Extremity Angiography;  Surgeon: Katha Cabal, MD;  Location: [place] [facility] LAB;  Service: Cardiovascular;  Laterality: Right;  ? PERIPHERAL VASCULAR CATHETERIZATION Right 01/25/2016  ? Procedure: Lower Extremity Angiography;  Surgeon: Katha Cabal, MD;  Location: [place] [facility];  Service: Cardiovascular;  Laterality: Right;  ? TOE AMPUTATION    ? TONSILLECTOMY    ? ? ?FHx:  ?Family History  ?Problem Relation Age of Onset  ? Hypertension Father   ? Coronary artery disease Father   ? Alcohol abuse Father   ? Heart failure Brother   ? ? ?Social History:  reports that he quit smoking about 6 years ago. His smoking use included cigarettes. He has a 22.50 pack-year smoking history. He has never used smokeless tobacco. He reports that he does not currently use alcohol after a past usage of about 3.0 standard drinks per week. He reports that he does not use drugs. ? ?Allergies:  ?Allergies  ?Allergen Reactions  ? Statins   ?  Other reaction(s): Muscle Pain ?Causes legs to ache per pt  ? ? ?Medications Prior to Admission  ?Medication Sig Dispense Refill  ? amiodarone (PACERONE) 200 MG tablet Take 200 mg by mouth daily.    ? apixaban (ELIQUIS) 5 MG TABS tablet Take 1 tablet (5 mg total) by mouth 2 (two) times daily. 60 tablet 5  ? aspirin EC 81 MG EC tablet Take 1 tablet (81 mg total) by mouth daily. Swallow whole. 30 tablet 0  ? Cholecalciferol  25 MCG (1000 UT) tablet Take 1,000 Units by mouth daily.    ? Cyanocobalamin (VITAMIN B-12) 5000 MCG TBDP Take 10,000 mcg by mouth daily.    ? digoxin (LANOXIN) 0.125 MG tablet Take 0.0625 mg by mouth daily.    ? diltiazem (CARDIZEM CD) 300 MG 24 hr capsule Take 1 capsule (300 mg total) by mouth daily.    ? ezetimibe (ZETIA) 10 MG tablet Take 10 mg by mouth at bedtime.    ? ferrous sulfate 325 (65 FE) MG tablet Take 325 mg by mouth daily with breakfast.    ? furosemide (LASIX) 20 MG tablet Take 20 mg by mouth daily. (Take with '40mg'$  tablet to equal '60mg'$  total)    ? furosemide (LASIX) 40 MG tablet Take 40 mg by mouth daily. (Take with '20mg'$  tablet to equal '60mg'$  total)    ? gabapentin (NEURONTIN) 300 MG capsule Take 300  mg by mouth at bedtime.    ? insulin glargine (LANTUS) 100 UNIT/ML Solostar Pen Inject 40 Units into the skin in the morning and at bedtime.    ? insulin lispro (HUMALOG) 100 UNIT/ML KwikPen Inject 15 Units into the skin 3 (three) times daily with meals. (Plus 0-15u sliding scale correction based upon glucose reading)    ? isosorbide mononitrate (IMDUR) 60 MG 24 hr tablet Take 60 mg by mouth 2 (two) times daily.    ? losartan (COZAAR) 50 MG tablet Take 50 mg by mouth daily.    ? metFORMIN (GLUCOPHAGE) 1000 MG tablet Take 1,000 mg by mouth 2 (two) times daily.     ? metoprolol tartrate (LOPRESSOR) 50 MG tablet Take 75 mg by mouth 2 (two) times daily.    ? Multiple Vitamins-Minerals (MULTIVITAMIN WITH MINERALS) tablet Take 1 tablet by mouth daily.    ? nitroGLYCERIN (NITROSTAT) 0.4 MG SL tablet Place 0.4 mg under the tongue every 5 (five) minutes as needed for chest pain.    ? pramipexole (MIRAPEX) 1 MG tablet Take 2 mg by mouth at bedtime.    ? primidone (MYSOLINE) 250 MG tablet Take 250 mg by mouth 2 (two) times daily.    ? tamsulosin (FLOMAX) 0.4 MG CAPS capsule Take 0.4 mg by mouth daily.    ? traMADol (ULTRAM) 50 MG tablet Take 50 mg by mouth daily as needed for moderate pain.    ? vitamin C (ASCORBIC  ACID) 500 MG tablet Take 500 mg by mouth daily.    ? zinc gluconate 50 MG tablet Take 50 mg by mouth daily.    ? ? ?Physical Exam: ?General: Alert and oriented.  No apparent distress. ? ?Vascular: DP/PT puls

## 2021-08-05 NOTE — Plan of Care (Signed)
?  Problem: Health Behavior/Discharge Planning: ?Goal: Ability to manage health-related needs will improve ?Outcome: Progressing ?  ?Problem: Clinical Measurements: ?Goal: Ability to maintain clinical measurements within normal limits will improve ?Outcome: Progressing ?  ?Problem: Clinical Measurements: ?Goal: Diagnostic test results will improve ?Outcome: Progressing ?  ?Problem: Clinical Measurements: ?Goal: Cardiovascular complication will be avoided ?Outcome: Progressing ?  ?Problem: Pain Managment: ?Goal: General experience of comfort will improve ?Outcome: Progressing ?  ?Problem: Clinical Measurements: ?Goal: Ability to avoid or minimize complications of infection will improve ?Outcome: Progressing ?  ?

## 2021-08-06 DIAGNOSIS — L03116 Cellulitis of left lower limb: Secondary | ICD-10-CM | POA: Diagnosis not present

## 2021-08-06 LAB — GLUCOSE, CAPILLARY
Glucose-Capillary: 78 mg/dL (ref 70–99)
Glucose-Capillary: 230 mg/dL — ABNORMAL HIGH (ref 70–99)
Glucose-Capillary: 208 mg/dL — ABNORMAL HIGH (ref 70–99)
Glucose-Capillary: 183 mg/dL — ABNORMAL HIGH (ref 70–99)

## 2021-08-06 NOTE — Plan of Care (Signed)
  Problem: Nutrition: Goal: Adequate nutrition will be maintained Outcome: Progressing   Problem: Coping: Goal: Level of anxiety will decrease Outcome: Progressing   Problem: Pain Managment: Goal: General experience of comfort will improve Outcome: Progressing   

## 2021-08-06 NOTE — Progress Notes (Signed)
Patient's family member came out from Room 124 to the Nurse's station upset and stating that she wanted Dr Reesa Chew out of the pt's room now; the pt is scheduled for surgery on 08/07/21 and she did not want him upset.  She stated that when he was in the hospital this past September 2022 she was his doctor and she never wanted her to touch him again and it should be noted in his chart; upon entering the pt's room, Dr Reesa Chew was standing at the foot of the bed asking the visitor what she had done; the visitor just pointed to the door and said "out"; Dr Reesa Chew walked out of the room with said nurse to the hallway; Dr Reesa Chew stated she would get another hospitalist to round on her ?

## 2021-08-06 NOTE — Progress Notes (Signed)
BP and HR 97/68 HR 115 then recent 98/68 HR 114 ?Messaged MD and asked about which morning medications to give d/t BP, MD said to only give Amiodarone and Metoprolol.  ?

## 2021-08-06 NOTE — Progress Notes (Signed)
?   08/06/21 0759  ?Assess: MEWS Score  ?Temp 98 ?F (36.7 ?C)  ?BP 97/68  ?Pulse Rate (!) 115  ?Resp 18  ?Level of Consciousness Alert  ?SpO2 95 %  ?O2 Device Room Air  ?Assess: MEWS Score  ?MEWS Temp 0  ?MEWS Systolic 1  ?MEWS Pulse 2  ?MEWS RR 0  ?MEWS LOC 0  ?MEWS Score 3  ?MEWS Score Color Yellow  ?Assess: if the MEWS score is Yellow or Red  ?Were vital signs taken at a resting state? No  ?Focused Assessment No change from prior assessment  ?Does the patient meet 2 or more of the SIRS criteria? No  ?MEWS guidelines implemented *See Row Information* No, previously yellow, continue vital signs every 4 hours  ?Treat  ?Pain Scale 0-10  ?Pain Score 0  ?Notify: Charge Nurse/RN  ?Name of Charge Nurse/RN Notified Robyn, RN  ?Date Charge Nurse/RN Notified 08/06/21  ?Time Charge Nurse/RN Notified [phone]  ?Assess: SIRS CRITERIA  ?SIRS Temperature  0  ?SIRS Pulse 1  ?SIRS Respirations  0  ?SIRS WBC 0  ?SIRS Score Sum  1  ? ? ?

## 2021-08-06 NOTE — Progress Notes (Signed)
?Progress Note ? ? ?Patient: [name]. IPJ:[id number] DOB: January 30, 1956 DOA: 08/04/2021     1 ?DOS: the patient was seen and examined on 08/06/2021 ?  ?Brief hospital course: ?Mr. Rakeem Colley is a 66 year old male with history of hypertension, insulin-dependent diabetes mellitus, morbid obesity (class III), depression, CAD status post CABG in 2006 and PCI to the RCA in 12/2020, chronic bilateral venous stasis on [place] boots, chronic left foot ulcer, COPD, OSA on CPAP nightly, atrial fibrillation, on anticoagulation, PAD status post left first toe amputation presents emergency department for chief from wound clinic for chief concerns of cellulitis failing outpatient therapy. ? ?Vitals in the emergency department show temperature of 98.9, respiration rate of 18, initial heart rate 124 and improved to 121, blood pressure 130/71, SPO2 of 94% on room air. ? ?Serum sodium 138, potassium 4.1, chloride 100, bicarb 28, BUN 22, serum creatinine of 0.87, GFR greater than 60, nonfasting blood glucose 190, WBC 10.5, hemoglobin 10.8, platelets of 203.  Blood cultures x2 have been collected and are pending.  Lactic acid was 1.9. ? ?Left foot x-ray was done in the ED and read as no acute findings.  Evidence of patient's soft tissue wound over the heel without significant change.  No evidence of underlying bone destruction. ? ?ED treatment: Cefepime and vancomycin IV. ? ?4/8: Patient with no complaints.  MRI left ankle was ordered by podiatry which shows some concern of early osteomyelitis.  Wound was not very impressive for active infection, a lot of dead tissue with mild surrounding erythema, minimum serous discharge. ?Appropriate to stop antibiotics and obtained a wound/deep wound culture so we can treat appropriately.  Discussed with podiatry who will proceed with wound debridement and bone culture on Monday, we will stop antibiotics at this time and will restart after getting the tissue. ?We will continue with wound  care. ?Patient is high risk for BKA. ?We will involve infectious disease on Monday. ?Holding Eliquis for potential surgery on Monday, can resume on Tuesday afternoon per podiatry. ? ?4/9: Podiatry is going to take him to the OR tomorrow for partial calcanectomy and debridement.  Patient will be non weightbearing on that extremity for a long time, very high risk for BKA per podiatry note.  Plan is to restart antibiotics after getting tissue for culture. ? ?Patient wife and 2 other family members were present in the room when I entered, as soon as I entered the room his wife started shouting that you get out of this room as you make him upset back in the admission in September.  I tried asking the reason as I had a good conversation with patient yesterday.  But she started shouting that she cannot see my face and I should get out of the room and starting shoveling me away.  Charge nurse got involved and I stepped out of the room.  I could not recall my prior incidence at that moment but later after thinking I had a similar behavior few months back when I was having a good relationship with the patient untill 2 days later when wife was in the room she started shouting at me that I made the patient very upset telling him that he has a positive blood culture and have infection and she refused to see my face. ?DOC was notified at that time. ?I reviewed the chart and find out that this is the same family and this lady is very raciest and do not want to see a different color  skin. ?DOC is going to be notified about this incidence. ? ? ?Assessment and Plan: ?* Cellulitis ?Patient has chronic nonhealing wound, failed multiple courses of antibiotics. ?CRP and ESR elevated.  Blood cultures negative in 24 hours.  Clinically looks like a lot of dead tissue with minimum erythema and serous discharge, not very impressive for acute infection.  MRI with concern of osteomyelitis.  Podiatry is planning debridement on Monday. ?High risk  for BKA.  He was placed on vancomycin and cefepime on admission ?-Stop antibiotics for now and we will restart after getting deep tissue and bone biopsy for culture on Monday, patient currently medically stable ?- Per patient he uses Dakin's solution 0.25% daily around the perimeter of his wound ?- Wound care was consulted- continue with their recommendations. ?-Continue to monitor, if becomes febrile we can restart antibiotics ?-Podiatry is going to take him to the OR tomorrow. ? ?Wound healing, delayed ?Podiatry is on board, going for wound debridement on Monday. ?Please see above note ? ?Insulin dependent type 2 diabetes mellitus ([place]) ?Uncontrolled diabetes mellitus with hyperglycemia and A1c of 10.2. ?CBG remained elevated. ?-Continue with home dose of Lantus 40 units twice daily ?-Add 6 units for mealtime coverage ?-Continue with resistant SSI ?- Goal inpatient blood glucose level is 140-180 ?- Diabetic/carb modified/heart healthy diet ordered ? ?Essential hypertension, benign ?Blood pressure within goal. ?- Home losartan 100 mg daily resumed for 08/05/2021 ?- Metoprolol tartrate 75 mg p.o. twice daily resumed ? ?CAD (coronary artery disease) ?No chest pain. ?- Aspirin 81 mg daily, ezetimibe 10 mg nightly, Imdur 60 mg p.o. twice daily resumed ?- Home nitroglycerin sublingual every 5 minutes as needed for chest pain resumed ? ?Hypersomnia with sleep apnea ?- CPAP nightly ordered ?- He endorses compliance of cpap use ? ?Pure hypercholesterolemia ?- Continue with Home ezetimibe 10 mg nightly  ? ?Obesity, Class III, BMI 40-49.9 (morbid obesity) ([place]) ?Estimated body mass index is 46.03 kg/m? as calculated from the following: ?  Height as of this encounter: 6' 4.5" (1.943 m). ?  Weight as of this encounter: 173.8 kg.  ? ?-This will affect overall prognosis. ?-Counseling was provided. ? ?BPH (benign prostatic hyperplasia) ?- Continue with home tamsulosin 0.4 mg p.o. daily ? ?Atrial fibrillation, chronic ([place]) ?-  Resumed home diltiazem 300 mg daily with breakfast, amiodarone 200 mg daily before breakfast, metoprolol tartrate 75 mg p.o. twice daily ?- Holding home Eliquis until Tuesday afternoon for surgery on Monday. ? ?Venous stasis of both lower extremities ?Patient has an history of chronic venous stasis of lower extremities. ?-Continue home dose of Lasix 40 mg daily ?-Continue with Ace wrap ? ?AF (paroxysmal atrial fibrillation) (H[facility])-resolved as of 08/04/2021 ?- Resumed home Eliquis 5 mg p.o. twice daily ?- Resumed home diltiazem 300 mg daily before breakfast, amiodarone 200 mg daily before breakfast, metoprolol tartrate 75 mg p.o. twice daily ? ? ?Subjective: Unable to talk with patient or examine him as his wife started shouting as soon as I entered in the room and shoveling me out, stating that they do not want to see my face. ? ?Physical Exam: ?Vitals:  ? 08/06/21 7737 08/06/21 3668 08/06/21 0759 08/06/21 0939  ?BP: 101/60  97/68 98/68  ?Pulse: (!) 115 (!) 109 (!) 115 (!) 114  ?Resp: (!) $RemoveBe'21 20 18   '[id number]$ ?Temp: (!) 97.5 ?F (36.4 ?C)  98 ?F (36.7 ?C)   ?TempSrc:      ?SpO2: 91% 93% 95%   ?Weight:      ?Height:      ? ?  No physical exam done today as wife did not let me talk or touch the patient. ? ?Data Reviewed: ?Prior notes and labs reviewed ? ?Family Communication: Wife and 2 other family members in the room, no meaningful communication done as wife continued to shout that you get out of the room as we do not want to see your face. ? ?Disposition: ?Status is: Inpatient ?Remains inpatient appropriate because: Severity of illness ? ? Planned Discharge Destination: To be determined ? ?DVT prophylaxis.  We will restart Eliquis after the procedure ?Time spent: 35 minutes ? ?This record has been created using Systems analyst. Errors have been sought and corrected,but may not always be located. Such creation errors do not reflect on the standard of care. ? ?Author: ?Lorella Nimrod, MD ?08/06/2021 1:20 PM ? ?For on  call review [email or web address].  ?

## 2021-08-06 NOTE — Assessment & Plan Note (Signed)
Patient has chronic nonhealing wound, failed multiple courses of antibiotics. ?CRP and ESR elevated.  Blood cultures negative in 24 hours.  Clinically looks like a lot of dead tissue with minimum erythema and serous discharge, not very impressive for acute infection.  MRI with concern of osteomyelitis.  Podiatry is planning debridement on Monday. ?High risk for BKA.  He was placed on vancomycin and cefepime on admission ?-Stop antibiotics for now and we will restart after getting deep tissue and bone biopsy for culture on Monday, patient currently medically stable ?- Per patient he uses Dakin's solution 0.25% daily around the perimeter of his wound ?- Wound care was consulted- continue with their recommendations. ?-Continue to monitor, if becomes febrile we can restart antibiotics ?-Podiatry is going to take him to the OR tomorrow. ?

## 2021-08-07 ENCOUNTER — Inpatient Hospital Stay: Payer: Medicare Other

## 2021-08-07 ENCOUNTER — Encounter
Admission: EM | Disposition: A | Discharge status: Skilled Nursing Facility | Payer: Self-pay | Attending: Internal Medicine

## 2021-08-07 ENCOUNTER — Inpatient Hospital Stay: Payer: Medicare Other | Admitting: Anesthesiology

## 2021-08-07 ENCOUNTER — Other Ambulatory Visit: Payer: Self-pay

## 2021-08-07 ENCOUNTER — Encounter: Payer: Self-pay | Admitting: Internal Medicine

## 2021-08-07 DIAGNOSIS — M86162 Other acute osteomyelitis, left tibia and fibula: Secondary | ICD-10-CM

## 2021-08-07 DIAGNOSIS — L03116 Cellulitis of left lower limb: Secondary | ICD-10-CM | POA: Diagnosis not present

## 2021-08-07 DIAGNOSIS — I482 Chronic atrial fibrillation, unspecified: Secondary | ICD-10-CM | POA: Diagnosis not present

## 2021-08-07 HISTORY — PX: INCISION AND DRAINAGE: SHX5863

## 2021-08-07 LAB — GLUCOSE, CAPILLARY
Glucose-Capillary: 71 mg/dL (ref 70–99)
Glucose-Capillary: 64 mg/dL — ABNORMAL LOW (ref 70–99)
Glucose-Capillary: 58 mg/dL — ABNORMAL LOW (ref 70–99)
Glucose-Capillary: 78 mg/dL (ref 70–99)
Glucose-Capillary: 75 mg/dL (ref 70–99)
Glucose-Capillary: 205 mg/dL — ABNORMAL HIGH (ref 70–99)

## 2021-08-07 SURGERY — INCISION AND DRAINAGE
Anesthesia: General | Site: Heel | Laterality: Left

## 2021-08-07 MED ORDER — BUPIVACAINE HCL (PF) 0.5 % IJ SOLN
INTRAMUSCULAR | Status: AC
  Filled 2021-08-07: qty 30

## 2021-08-07 MED ORDER — PHENYLEPHRINE HCL-NACL 20-0.9 MG/250ML-% IV SOLN
INTRAVENOUS | Status: AC
  Filled 2021-08-07: qty 250

## 2021-08-07 MED ORDER — 0.9 % SODIUM CHLORIDE (POUR BTL) OPTIME
TOPICAL | Status: DC | PRN
  Administered 2021-08-07: 500 mL

## 2021-08-07 MED ORDER — FENTANYL CITRATE (PF) 100 MCG/2ML IJ SOLN: 25.0000 ug | INTRAMUSCULAR | Status: DC | PRN

## 2021-08-07 MED ORDER — PROPOFOL 10 MG/ML IV BOLUS
INTRAVENOUS | Status: DC | PRN
  Administered 2021-08-07: 120 mg via INTRAVENOUS

## 2021-08-07 MED ORDER — SODIUM CHLORIDE 0.9 % IR SOLN
Status: DC | PRN
  Administered 2021-08-07: 3000 mL

## 2021-08-07 MED ORDER — BUPIVACAINE HCL (PF) 0.5 % IJ SOLN
INTRAMUSCULAR | Status: DC | PRN
  Administered 2021-08-07: 10 mL

## 2021-08-07 MED ORDER — DROPERIDOL 2.5 MG/ML IJ SOLN
0.6250 mg | Freq: Once | INTRAMUSCULAR | Status: DC | PRN
  Filled 2021-08-07: qty 2

## 2021-08-07 MED ORDER — OXYCODONE HCL 5 MG PO TABS: 5.0000 mg | ORAL_TABLET | Freq: Once | ORAL | Status: DC | PRN

## 2021-08-07 MED ORDER — HEMOSTATIC AGENTS (NO CHARGE) OPTIME
TOPICAL | Status: DC | PRN
  Administered 2021-08-07: 1 via TOPICAL

## 2021-08-07 MED ORDER — CHLORHEXIDINE GLUCONATE 0.12 % MT SOLN
15.0000 mL | Freq: Once | OROMUCOSAL | Status: AC
Start: 2021-08-07 — End: 2021-08-07

## 2021-08-07 MED ORDER — DEXTROSE 50 % IV SOLN
INTRAVENOUS | Status: AC
  Administered 2021-08-07: 25 mL
  Filled 2021-08-07: qty 50

## 2021-08-07 MED ORDER — LIDOCAINE-EPINEPHRINE 1 %-1:100000 IJ SOLN
INTRAMUSCULAR | Status: DC | PRN
  Administered 2021-08-07: 20 mL
  Administered 2021-08-07: 10 mL

## 2021-08-07 MED ORDER — PHENYLEPHRINE HCL (PRESSORS) 10 MG/ML IV SOLN
INTRAVENOUS | Status: AC
Start: 2021-08-07 — End: ?
  Filled 2021-08-07: qty 1

## 2021-08-07 MED ORDER — VANCOMYCIN HCL 1000 MG IV SOLR
INTRAVENOUS | Status: DC | PRN
  Administered 2021-08-07: 1000 mg

## 2021-08-07 MED ORDER — SODIUM CHLORIDE 0.9 % IV SOLN: INTRAVENOUS | Status: DC

## 2021-08-07 MED ORDER — LIDOCAINE-EPINEPHRINE 1 %-1:200000 IJ SOLN
INTRAMUSCULAR | Status: AC
Start: 2021-08-07 — End: ?
  Filled 2021-08-07: qty 30

## 2021-08-07 MED ORDER — CHLORHEXIDINE GLUCONATE 0.12 % MT SOLN
OROMUCOSAL | Status: AC
  Administered 2021-08-07: 15 mL via OROMUCOSAL
  Filled 2021-08-07: qty 15

## 2021-08-07 MED ORDER — PHENYLEPHRINE HCL-NACL 20-0.9 MG/250ML-% IV SOLN
INTRAVENOUS | Status: DC | PRN
  Administered 2021-08-07: 75 ug/min via INTRAVENOUS

## 2021-08-07 MED ORDER — OXYCODONE HCL 5 MG/5ML PO SOLN: 5.0000 mg | Freq: Once | ORAL | Status: DC | PRN

## 2021-08-07 MED ORDER — SEVOFLURANE IN SOLN
RESPIRATORY_TRACT | Status: AC
  Filled 2021-08-07: qty 250

## 2021-08-07 MED ORDER — LIDOCAINE HCL (CARDIAC) PF 100 MG/5ML IV SOSY
PREFILLED_SYRINGE | INTRAVENOUS | Status: DC | PRN
  Administered 2021-08-07: 100 mg via INTRAVENOUS

## 2021-08-07 MED ORDER — INSULIN ASPART 100 UNIT/ML IJ SOLN
0.0000 [IU] | INTRAMUSCULAR | Status: DC
  Administered 2021-08-07: 3 [IU] via SUBCUTANEOUS
  Administered 2021-08-08 (×2): 5 [IU] via SUBCUTANEOUS
  Administered 2021-08-08 (×2): 3 [IU] via SUBCUTANEOUS
  Filled 2021-08-07 (×5): qty 1

## 2021-08-07 MED ORDER — PHENYLEPHRINE HCL (PRESSORS) 10 MG/ML IV SOLN
INTRAVENOUS | Status: DC | PRN
Start: 2021-08-07 — End: 2021-08-07
  Administered 2021-08-07: 160 ug via INTRAVENOUS

## 2021-08-07 MED ORDER — LIDOCAINE HCL (PF) 1 % IJ SOLN
INTRAMUSCULAR | Status: AC
  Filled 2021-08-07: qty 30

## 2021-08-07 MED ORDER — ACETAMINOPHEN 10 MG/ML IV SOLN: 1000.0000 mg | Freq: Once | INTRAVENOUS | Status: DC | PRN

## 2021-08-07 SURGICAL SUPPLY — 50 items
BLADE MED AGGRESSIVE (BLADE) ×1 IMPLANT
BNDG ELASTIC 4X5.8 VLCR NS LF (GAUZE/BANDAGES/DRESSINGS) ×1 IMPLANT
BNDG ELASTIC 4X5.8 VLCR STR LF (GAUZE/BANDAGES/DRESSINGS) ×1 IMPLANT
BNDG ELASTIC 6X5.8 VLCR STR LF (GAUZE/BANDAGES/DRESSINGS) ×1 IMPLANT
BNDG ESMARK 4X12 TAN STRL LF (GAUZE/BANDAGES/DRESSINGS) ×2 IMPLANT
BNDG GAUZE ELAST 4 BULKY (GAUZE/BANDAGES/DRESSINGS) ×3 IMPLANT
BNDG STRETCH 4X75 STRL LF (GAUZE/BANDAGES/DRESSINGS) ×1 IMPLANT
CNTNR SPEC 2.5X3XGRAD LEK (MISCELLANEOUS) ×2
CONT SPEC 4OZ STER OR WHT (MISCELLANEOUS) ×2
CONTAINER SPEC 2.5X3XGRAD LEK (MISCELLANEOUS) IMPLANT
CUFF TOURN SGL QUICK 18X4 (TOURNIQUET CUFF) ×1 IMPLANT
DRSG EMULSION OIL 3X8 NADH (GAUZE/BANDAGES/DRESSINGS) ×1 IMPLANT
ELECT REM PT RETURN 9FT ADLT (ELECTROSURGICAL) ×2
ELECTRODE REM PT RTRN 9FT ADLT (ELECTROSURGICAL) ×1 IMPLANT
GAUZE SPONGE 4X4 12PLY STRL (GAUZE/BANDAGES/DRESSINGS) ×2 IMPLANT
GAUZE XEROFORM 1X8 LF (GAUZE/BANDAGES/DRESSINGS) ×2 IMPLANT
GLOVE SURG ENC MOIS LTX SZ7 (GLOVE) ×2 IMPLANT
GLOVE SURG UNDER LTX SZ7 (GLOVE) ×2 IMPLANT
GOWN STRL REUS W/ TWL LRG LVL3 (GOWN DISPOSABLE) ×2 IMPLANT
GOWN STRL REUS W/TWL LRG LVL3 (GOWN DISPOSABLE) ×2
HANDPIECE VERSAJET DEBRIDEMENT (MISCELLANEOUS) IMPLANT
HEMOSTAT SURGICEL 2X14 (HEMOSTASIS) ×1 IMPLANT
IV NS 1000ML (IV SOLUTION)
IV NS 1000ML BAXH (IV SOLUTION) IMPLANT
KIT STIMULAN RAPID CURE 5CC (Orthopedic Implant) ×1 IMPLANT
KIT TURNOVER KIT A (KITS) ×2 IMPLANT
LABEL OR SOLS (LABEL) ×1 IMPLANT
MANIFOLD NEPTUNE II (INSTRUMENTS) ×2 IMPLANT
NDL FILTER BLUNT 18X1 1/2 (NEEDLE) ×1 IMPLANT
NDL HYPO 25X1 1.5 SAFETY (NEEDLE) ×2 IMPLANT
NEEDLE FILTER BLUNT 18X 1/2SAF (NEEDLE) ×1
NEEDLE FILTER BLUNT 18X1 1/2 (NEEDLE) ×1 IMPLANT
NEEDLE HYPO 25X1 1.5 SAFETY (NEEDLE) ×4 IMPLANT
NS IRRIG 500ML POUR BTL (IV SOLUTION) ×2 IMPLANT
PACK EXTREMITY ARMC (MISCELLANEOUS) ×2 IMPLANT
PAD ABD DERMACEA PRESS 5X9 (GAUZE/BANDAGES/DRESSINGS) ×2 IMPLANT
PULSAVAC PLUS IRRIG FAN TIP (DISPOSABLE) ×2
SOL PREP POV-IOD 4OZ 10% (MISCELLANEOUS) ×1 IMPLANT
STOCKINETTE STRL 6IN 960660 (GAUZE/BANDAGES/DRESSINGS) ×2 IMPLANT
SUT ETHILON 3-0 FS-10 30 BLK (SUTURE)
SUT ETHILON 4-0 (SUTURE)
SUT ETHILON 4-0 FS2 18XMFL BLK (SUTURE)
SUT VIC AB 3-0 SH 27 (SUTURE)
SUT VIC AB 3-0 SH 27X BRD (SUTURE) IMPLANT
SUT VIC AB 4-0 FS2 27 (SUTURE) IMPLANT
SUTURE EHLN 3-0 FS-10 30 BLK (SUTURE) IMPLANT
SUTURE ETHLN 4-0 FS2 18XMF BLK (SUTURE) IMPLANT
SWAB CULTURE AMIES ANAERIB BLU (MISCELLANEOUS) ×1 IMPLANT
SYR 10ML LL (SYRINGE) ×2 IMPLANT
TIP FAN IRRIG PULSAVAC PLUS (DISPOSABLE) IMPLANT

## 2021-08-07 NOTE — Op Note (Signed)
PODIATRY / FOOT AND ANKLE SURGERY OPERATIVE REPORT ? ? ? ?SURGEON: Caroline More, DPM ? ?PRE-OPERATIVE DIAGNOSIS:  ?1.  Left calcaneal osteomyelitis with associated neuropathic ulceration ?2.  Diabetes type 2 polyneuropathy ?3.  PVD ? ?POST-OPERATIVE DIAGNOSIS: Same ? ?PROCEDURE(S): ?Left partial calcanectomy ?Left foot wound debridement with application of antibiotic beads into bone. ? ?HEMOSTASIS: Left ankle tourniquet ? ?ANESTHESIA: general ? ?ESTIMATED BLOOD LOSS: 50 cc ? ?FINDING(S): ?1.  Left calcaneal osteomyelitis ?2.  Left calcaneal heel ulceration with obvious necrosis to level of bone ? ?PATHOLOGY/SPECIMEN(S): Left calcaneal bone biopsy and bone culture ? ?INDICATIONS:   ?[name]. is a 66 y.o. male who presents with a nonhealing ulceration to the left plantar heel chronically.  Patient has been going to the wound care center for further treatment for this.  Patient was subsequently told to go to the emergency room due to signs of worsening infection including osteomyelitis and left lower extremity swelling with cellulitis.  Patient had x-ray imaging and a MRI which did confirm osteomyelitis of the calcaneus.  All treatment options were discussed with the patient both conservative and surgical attempts at correction include potential risks and complications at this time patient is elected for surgical procedure described above.  No guarantees given.  Patient is at high risk for limb loss.  Believe that ultimately this may be where patient is heading due to the amount of tissue loss that is present as well as bone infection.  Patient to consider options in the future. ? ?DESCRIPTION: ?After obtaining full informed written consent, the patient was brought back to the operating room and placed supine upon the operating table.  The patient received IV antibiotics prior to induction.  After obtaining adequate anesthesia, a one-to-one mix of 20 cc of half percent Marcaine plain and 1% lidocaine with  epinephrine was injected about the left heel ulceration.  The patient was prepped and draped in the standard fashion.  An Esmarch bandage was used to exsanguinate the left lower extremity and the pneumatic ankle tourniquet was inflated. ? ?Attention was directed to the left plantar heel.  Wound measurement prior to any debridement or procedure measured 5 cm x 4.5 cm x 2.5 cm and probed close to the calcaneus.  The wound bed appeared to be fibronecrotic with scant areas of granularity.  15 blade was used to make a circumferential incision around the area of gangrenous necrosis that involved most of the ulcerative site.  This was made deep into the deep layers of tissue to the level of plantar fascia and muscle.  The gangrenous necrosis tissue was resected and passed off the operative site.  There appeared to be a fair amount of healthy tissue to this level that appeared to be bleeding.  The plantar fascia was slightly removed from the calcaneus and the calcaneus did appear to be exposed centrally underneath some of the necrotic tissue.  The bone appeared to be soft in this area consistent with osteomyelitic changes.  At this time a elevator was used to elevate some of the surrounding soft tissues and periosteum off of the calcaneus at the plantar surface.  Once this was adequately dissected the sagittal bone saw was then used to remove the plantar lateral condyle and some of the lateral portion of the calcaneus as well as the central and a small portion of the medial tubercle of the calcaneus.  Once these areas were resected they are passed off the operative site and a bone culture was taken  and passed off and sent for culture.  A pathologic specimen was then passed off of the calcaneus for further assessment.  A rongeur was used to resect any other areas of soft bone to the level of hard healthy bleeding bone.  The tissues for the most part appeared to be viable to this point but once again there is concern that  there was gangrenous necrosis to the level of the heel consistent with osteomyelitis as well as soft bony changes.  At this time electrocauterization was used to cauterize any bleeding vessels.  The surgical site was flushed with 3 L of normal sterile saline.  After this time the wound was then packed with antibiotic beads with vancomycin.  Surgicel was then applied to the calcaneus as well as to the soft tissues to try to control bleeding further.  Adaptic was then applied to the external surface of the heel and laid over the wound and was sutured in a place to hold the antibiotic beads in place with 3-0 nylon circumferentially around the wound.   ? ?A postoperative dressing was then applied consisting of 4 x 4 gauze, ABD pads, Kerlix, Ace wrap with compression to control swelling and for hemostasis control.  The pneumatic ankle tourniquet was deflated and a prompt hyperemic response was noted all digits left foot.  The patient tolerated the procedure and anesthesia well was transferred to recovery room vital signs stable and vascular status appearing to be intact to all toes left foot.  The patient will be discharged to the PACU area and subsequently sent back to his inpatient room with the appropriate orders and instructions.  Patient is to remain nonweightbearing at all times left lower extremity and should be on bedrest for the next 12 to 24 hours to try to avoid any excessive bleeding.  Patient may continue/restart IV antibiotics.  Patient to still hold on taking Eliquis medication or blood thinner medication for 24 hours after surgery.  Patient remains at high risk for limb loss.  Ultimately believe this is likely where he is heading due to the amount of tissue loss as well as bone infection is present.  Consult placed with Dr. Lucky Cowboy with vascular surgery service to follow-up with patient as patient will likely need amputation in the future. ? ?COMPLICATIONS: None ? ?CONDITION: Good, stable ? ?Caroline More,  DPM ? ?

## 2021-08-07 NOTE — Assessment & Plan Note (Signed)
Continue diuretic and Ace wrap. ? ?

## 2021-08-07 NOTE — Assessment & Plan Note (Signed)
Continue Zetia. °

## 2021-08-07 NOTE — Assessment & Plan Note (Signed)
Blood pressure stable during this hospitalization.  Continue ARB and beta-blocker. ?

## 2021-08-07 NOTE — Consult Note (Signed)
[facility] Nurse Consult Note: ?Reason for Consult: [facility] Nurse to place NPWT to left heel over sutured adaptic that is securing antibiotic beads. Begin on Tuesday, 08/08/21 in the afternoon after Dr. Cleda Mccreedy sees patent. ?Discussed with Dr. Luana Shu via Secure Chat the dressing change schedule for the rest of the week.  Order received for NPWT placement on Tuesday, 08/08/21, change on Friday, 08/11/21 then M/W/F thereafter while in house.  ?Follow NPWT dressing placement with application of compression to LLE (Kerlix/ACE). Dress wounds on RLE with xeroform followed by Kerlix/ACE. ? ?Wound type:Infectious ? ?[facility] nursing team will follow, and will remain available to this patient, the nursing and medical teams.  ? ?Thanks, ?Maudie Flakes, MSN, RN, [place], [place], CWON-AP, [place]  ?Pager# [phone]  ? ? ? ?  ?

## 2021-08-07 NOTE — Assessment & Plan Note (Signed)
-   Continue Flomax 

## 2021-08-07 NOTE — Assessment & Plan Note (Signed)
Estimated body mass index is 46.03 kg/m? as calculated from the following: ?  Height as of this encounter: 6' 4.5" (1.943 m). ?  Weight as of this encounter: 173.8 kg.  ? ?Patient meets criteria BMI greater than 40. ?

## 2021-08-07 NOTE — Assessment & Plan Note (Signed)
-  Continue nightly CPAP °

## 2021-08-07 NOTE — Assessment & Plan Note (Signed)
Stable during this hospitalization.  Continue aspirin and statin plus antihypertensives. ? ?

## 2021-08-07 NOTE — Transfer of Care (Signed)
Immediate Anesthesia Transfer of Care Note ? ?Patient: [name]. ? ?Procedure(s) Performed: INCISION AND DRAINAGE-Partial Calcanectomy (Left: Heel) ? ?Patient Location: PACU ? ?Anesthesia Type:General ? ?Level of Consciousness: sedated ? ?Airway & Oxygen Therapy: Patient Spontanous Breathing and Patient connected to face mask oxygen ? ?Post-op Assessment: Report given to RN and Post -op Vital signs reviewed and stable ? ?Post vital signs: Reviewed and stable ? ?Last Vitals:  ?Vitals Value Taken Time  ?BP    ?Temp    ?Pulse 103 08/07/21 1756  ?Resp 19 08/07/21 1756  ?SpO2 95 % 08/07/21 1756  ?Vitals shown include unvalidated device data. ? ?Last Pain:  ?Vitals:  ? 08/07/21 1558  ?TempSrc: Temporal  ?PainSc: 0-No pain  ?   ? ?  ? ?Complications: No notable events documented. ?

## 2021-08-07 NOTE — Anesthesia Procedure Notes (Signed)
Procedure Name: LMA Insertion ?Date/Time: 08/07/2021 5:27 PM ?Performed by: Nelda Marseille, CRNA ?Pre-anesthesia Checklist: Patient identified, Patient being monitored, Timeout performed, Emergency Drugs available and Suction available ?Patient Re-evaluated:Patient Re-evaluated prior to induction ?Oxygen Delivery Method: Circle system utilized ?Preoxygenation: Pre-oxygenation with 100% oxygen ?Induction Type: IV induction ?Ventilation: Mask ventilation without difficulty ?LMA: LMA inserted ?LMA Size: 5.0 ?Tube type: Oral ?Number of attempts: 1 ?Placement Confirmation: positive ETCO2 and breath sounds checked- equal and bilateral ?Tube secured with: Tape ?Dental Injury: Teeth and Oropharynx as per pre-operative assessment  ? ? ? ? ?

## 2021-08-07 NOTE — Progress Notes (Signed)
[facility] Progress Note ? ?Patient: [name].    XKG:[id number]  DOA: 08/04/2021    ?Date of Service: the patient was seen and examined on 08/07/2021 ? ?Brief hospital course: ?66 year old male with past medical history of poorly controlled diabetes on insulin, hypertension, morbid obesity, COPD, atrial fibrillation on anticoagulation, CAD status post CABG obstructive sleep apnea and PAD with a chronic left foot ulcer who presented to the emergency room after being sent over from the wound clinic for worsening cellulitis of his left foot that was not responding to outpatient antibiotics.  Patient admitted to the hospitalist service and podiatry consulted.  An MRI of the left ankle was done noting early osteomyelitis.  Given concerns for BKA, podiatry plans to take patient on 4/10 for wound debridement and partial calcanectomy.  Infectious disease consulted.   ? ? ? ?Assessment and Plan: ?Assessment and Plan: ?* Acute osteomyelitis of lower leg, left ([place]) ?High risk for BKA.  Hoping debridement and partial calcanectomy will be enough.  For OR this afternoon. ? ?Cellulitis of left foot ?Chronic nonhealing wound and now with concerns for osteomyelitis.  Initially on vancomycin and cefepime.  Continue wound care.  Antibiotics on hold and hopefully surgery will be curative.  ? ?Insulin dependent type 2 diabetes mellitus ([place]) ?Poorly controlled.  A1c of 10.2.  Appreciate diabetic coordinator's help.  Prior to surgery today, I have made him n.p.o. with only 10 units of Lantus this morning plus every 4 hours sensitive sliding scale.  Postop, will restart Lantus twice daily but at lower dose.   ?- Diabetic/carb modified/heart healthy diet ordered ? ?Hypersomnia with sleep apnea ?Continue nightly CPAP ? ?Atrial fibrillation, chronic ([place]) ?Continued on Cardizem and amiodarone plus metoprolol.  Holding Eliquis till after surgery.  Currently rate controlled and appears to be in sinus at times. ? ?Essential  hypertension, benign ?Blood pressure stable during this hospitalization.  Continue ARB and beta-blocker. ? ?CAD (coronary artery disease) ?Stable during this hospitalization.  Continue aspirin and statin plus antihypertensives. ? ? ?Pure hypercholesterolemia ?Continue Zetia ? ?Venous stasis of both lower extremities ?Continue diuretic and Ace wrap. ? ? ?BPH (benign prostatic hyperplasia) ?Continue Flomax ? ?Obesity, Class III, BMI 40-49.9 (morbid obesity) ([place]) ?Estimated body mass index is 46.03 kg/m? as calculated from the following: ?  Height as of this encounter: 6' 4.5" (1.943 m). ?  Weight as of this encounter: 173.8 kg.  ? ?Patient meets criteria BMI greater than 40. ? ?AF (paroxysmal atrial fibrillation) (HCC)-resolved as of 08/04/2021 ?- Resumed home Eliquis 5 mg p.o. twice daily ?- Resumed home diltiazem 300 mg daily before breakfast, amiodarone 200 mg daily before breakfast, metoprolol tartrate 75 mg p.o. twice daily ? ? ? ? ? ? ?Body mass index is 46.03 kg/m?.  ?  ?   ? ?Consultants: ?Podiatry ? ?Procedures: ?Planned partial calcanectomy and wound debridement of left leg/heel ? ?Antimicrobials: ?IV vancomycin and cefepime 4/7 - 4/9 ? ?Code Status: Full code ? ? ?Subjective: Patient doing okay.  He has no pain in his foot due to neuropathy. ? ?Objective: ?Tachycardia noted ?Vitals:  ? 08/07/21 0519 08/07/21 0727  ?BP: 111/76 118/82  ?Pulse: (!) 109 (!) 110  ?Resp: 18 20  ?Temp: (!) 97.3 ?F (36.3 ?C) 97.9 ?F (36.6 ?C)  ?SpO2: 94% 96%  ? ? ?Intake/Output Summary (Last 24 hours) at 08/07/2021 1344 ?Last data filed at 08/06/2021 1847 ?Gross per 24 hour  ?Intake 240 ml  ?Output --  ?Net 240 ml  ? ?  Filed Weights  ? 08/04/21 1538 08/04/21 2033  ?Weight: (!) 161.5 kg (!) 173.8 kg  ? ?Body mass index is 46.03 kg/m?. ? ?Exam: ? ?General: Alert and oriented x3, no acute distress ?HEENT: Normocephalic, atraumatic, mucous membranes are moist ?Cardiovascular: Irregular rhythm, borderline tachycardia ?Respiratory: Decreased  breath sounds throughout secondary to body habitus ?Abdomen: Soft, nontender, nondistended, positive bowel sounds ?Musculoskeletal: No clubbing or cyanosis, trace pitting edema, left leg in soft boot ?Skin: Nonhealing ulcer for left heel ?Psychiatry: Appropriate, no evidence of psychoses ?Neurology: No focal deficits, chronic neuropathy ? ?Data Reviewed: ?There are no new results to review at this time. ? ?Disposition:  ?Status is: Inpatient ?Remains inpatient appropriate because: Surgery today ?  ? ?Anticipated discharge date: 4/11 - 4/12 ? ?Remaining issues to be resolved so that patient can be discharged: Surgery and postop recovery. ? ? ?Family Communication: Brother at the bedside ?DVT Prophylaxis: ?Place TED hose Start: 08/04/21 1827 ? ? ? ?Author: ?Annita Brod ,MD ?08/07/2021 1:44 PM ? ?To reach On-call, see care teams to locate the attending and reach out via [email or web address]. ?Between 7PM-7AM, please contact night-coverage ?If you still have difficulty reaching the attending provider, please page the [facility] (Director on Call) for [facility] on amion for assistance. ? ?

## 2021-08-07 NOTE — Assessment & Plan Note (Addendum)
Poorly controlled.  A1c of 10.2.  Appreciate diabetic coordinator's help.  Postsurgery, Lantus decreased to 35 units twice a day and resuming resistant sliding scale and adding NovoLog 4 units 3 times daily with meals. ?- Diabetic/carb modified/heart healthy diet ordered ?

## 2021-08-07 NOTE — Progress Notes (Addendum)
Inpatient Diabetes Program Recommendations ? ?AACE/ADA: New Consensus Statement on Inpatient Glycemic Control (2015) ? ?Target Ranges:  Prepandial:   less than 140 mg/dL ?     Peak postprandial:   less than 180 mg/dL (1-2 hours) ?     Critically ill patients:  140 - 180 mg/dL  ? ? Latest Reference Range & Units 08/06/21 07:54 08/06/21 12:23 08/06/21 17:20 08/06/21 21:13  ?Glucose-Capillary 70 - 99 mg/dL 78 230 (H) ? ?13 units Novolog ? ?40 units Semglee ? 208 (H) ? ?13 units Novolog ? 183 (H) ? ? ? ? ?40 units Semglee  ?(H): Data is abnormally high ? Latest Reference Range & Units 08/07/21 07:29  ?Glucose-Capillary 70 - 99 mg/dL 71  ? ? Latest Reference Range & Units 01/04/21 06:38 05/09/21 07:20 08/05/21 04:27  ?Hemoglobin A1C 4.8 - 5.6 % 8.9 (H) 9.2 (H) 10.2 (H) ? ?(246 mg/dl)  ?(H): Data is abnormally high ? ?Admit with: Cellulitis LE (sent from wound clinic) ? ?History: DM, COPD ? ?Home DM Meds: Lantus 40 units BID ?      Humalog 15 units TID with meals (Plus 0-15 units per SSI) ?      Metformin 1000 mg BID ? ?Current Orders: Semglee 40 units BID ?     Novolog Sensitive Correction Scale/ SSI (0-9 units) Q4 hours ?     Metformin 1000 mg BID ? ? ? ? ?MD- Note AM CBGs are on the lower end and pt's CBG rises if he does not receive Novolog Meal Coverage (no Novolog given yest AM for breakfast and CBG by 12pm was 230). ? ?Note that pt NPO this AM for surgery ? ?After Surgery today, Please consider: ? ?1. Reduce Semglee to 35 units BID (since AM CBGs <80) ? ?2. Restart Novolog Meal Coverage at lower dose: Novolog 4 units TID with meals ? ? ? ?Endocrinologist: Dr. Honor Junes with [facility] ?Last seen 04/28/2021 ?Was instructed to: ?Change Lantus to 80 units Daily ?Take Novolog 15 units with meals ?If you are under 125 before the meal, take 7 instead of 15 ?If you are over 200 before the meal, take 18 instead of 15 ?Diabetes Coordinator met w/ pt on 05/10/2021 and pt reported to the Diabetes RN that he did not change his  Lantus to once daily (per Dr. Sherren Mocha suggestion) and that he also ran out of Novolog--admits to forgetting to take his insulins sometimes and has purchased Novolin R insulin from [facility] as a substitute for the Novolog ? ?Addendum 11am--Met w/ pt this AM at bedside.  Reviewed home insulin regimen.  Pt told me he is still taking his Lantus as 40 units BID instead of 80 units daily as recommended by his ENDO.  Also taking Humalog 15 units TID + SSI.  Has follow up appt with ENDO in June.  Has Freestyle Libre 2 CGM and checks CBGs often--Stated to me they run >200 at home despite taking insulin as prescribed.  Strongly encouraged pt to follow up with ENDO in June as pt may need further insulin adjustments as A1c is now up to 10.2%.  Discussed with pt the importance of tight CBG control for wound healing especially after surgery.  Explained what type of insulin we are giving him and how much we are giving.  Pt did not have any questions for me regarding his inpatient or outpatient diabetes regimens. ? ? ? ? ?--Will follow patient during hospitalization-- ? ?Wyn Quaker RN, MSN, CDE ?Diabetes Coordinator ?Inpatient Glycemic Control  Team ?Team Pager: [phone] (8a-5p) ? ?

## 2021-08-07 NOTE — Assessment & Plan Note (Addendum)
Chronic nonhealing wound and now with concerns for osteomyelitis.  Initially on vancomycin and cefepime.  Continue wound care.  Initially antibiotics had been put on hold before surgery.  Now plan will be for IV antibiotics x6 weeks.  Wound VAC being placed today.  Patient requesting increase and Ultram for pain ?

## 2021-08-07 NOTE — Assessment & Plan Note (Addendum)
High risk for BKA in the future.  Patient is status post debridement and partial calcanectomy 4/10.  Infectious disease consulted.  Plan will be for IV antibiotics for the next 6 weeks.  PICC line ordered. ?

## 2021-08-07 NOTE — Assessment & Plan Note (Signed)
Continued on Cardizem and amiodarone plus metoprolol.  Holding Eliquis till after surgery.  Currently rate controlled and appears to be in sinus at times. ?

## 2021-08-07 NOTE — Anesthesia Preprocedure Evaluation (Addendum)
Anesthesia Evaluation  ?Patient identified by MRN, date of birth, ID band ?Patient awake ? ? ? ?Reviewed: ?Allergy & Precautions, NPO status , Patient's Chart, lab work & pertinent test results ? ?Airway ?Mallampati: III ? ?TM Distance: >3 FB ?Neck ROM: full ? ? ? Dental ? ?(+) Chipped, Partial Upper, Partial Lower, Missing, Poor Dentition ?  ?Pulmonary ?asthma , sleep apnea and Continuous Positive Airway Pressure Ventilation , COPD,  COPD inhaler, former smoker,  ?  ? ?+ decreased breath sounds ? ? ? ? ? Cardiovascular ?Exercise Tolerance: Poor ?hypertension, Pt. on medications ?+ CAD, + Past MI, + Cardiac Stents, + Peripheral Vascular Disease and +CHF  ?+ dysrhythmias Atrial Fibrillation  ?+ Peripheral Edema ?ECHO 1/23: ??1. Left ventricular ejection fraction, by estimation, is 50 to 55%. The  ?left ventricle has low normal function. The left ventricle has no regional  ?wall motion abnormalities. There is moderate left ventricular hypertrophy.  ?Left ventricular diastolic  ?function could not be evaluated.  ??2. Right ventricular systolic function was not well visualized. The right  ?ventricular size is not well visualized.  ??3. The mitral valve is normal in structure. No evidence of mitral valve  ?regurgitation.  ??4. The aortic valve is normal in structure. Aortic valve regurgitation is  ?not visualized. Aortic valve sclerosis is present, with no evidence of  ?aortic valve stenosis.  ? ? ?Cardizem and amiodarone plus metoprolol ?  ?Neuro/Psych ?negative neurological ROS ? negative psych ROS  ? GI/Hepatic ?negative GI ROS, Neg liver ROS,   ?Endo/Other  ?diabetes, Poorly Controlled, Type 2, Insulin DependentMorbid obesity ? Renal/GU ?  ? ?  ?Musculoskeletal ? ? Abdominal ?(+) + obese,   ?Peds ? Hematology ? ?(+) Blood dyscrasia, anemia ,   ?Anesthesia Other Findings ?Osteomyelitis  ? ?Past Medical History: ?No date: Arrhythmia ?    Comment:  atrial fibrillation ?No date:  Asthma ?No date: CHF (congestive heart failure) ([place]) ?No date: COPD (chronic obstructive pulmonary disease) ([place]) ?No date: Coronary artery disease ?No date: Depression ?No date: Diabetes mellitus without complication ([place]) ?No date: Gout ?No date: History anabolic steroid use ?No date: Hyperlipidemia ?No date: Hypertension ?No date: Hypogonadism in male ?No date: Morbid obesity ([place]) ?No date: Myocardial infarction [facility]) ?No date: Peripheral vascular disease ([place]) ?No date: Perirectal abscess ?No date: Pleurisy ?No date: Sleep apnea ?    Comment:  CPAP at night, no oxygen ?No date: Varicella ? ?Past Surgical History: ?No date: ABDOMINAL AORTIC ANEURYSM REPAIR ?01/10/2021: ACHILLES TENDON SURGERY; Left ?    Comment:  Procedure: ACHILLES LENGTHENING/KIDNER;  Surgeon: Luana Shu, ?             Mitzi Hansen, DPM;  Location: [facility] ORS;  Service: Podiatry;   ?             Laterality: Left; ?02/10/2016: AMPUTATION TOE; Right ?    Comment:  Procedure: AMPUTATION TOE 3RD TOE;  Surgeon: Larkin Ina  ?             Vickki Muff, DPM;  Location: [facility];  Service: Podiatry;   ?             Laterality: Right; ?02/24/2020: AMPUTATION TOE; Left ?    Comment:  Procedure: AMPUTATION TOE;  Surgeon: Caroline More, DPM; ?             Location: [facility] OR[facility];  Service: Podiatry;  Laterality:  ?             Left; ?98/06/3823: APPLICATION OF WOUND VAC; Left ?  Comment:  Procedure: APPLICATION OF WOUND VAC;  Surgeon: Luana Shu,  ?             Mitzi Hansen, DPM;  Location: [facility];  Service: Podiatry;   ?             Laterality: Left; ?11/18/2015: COLONOSCOPY WITH PROPOFOL; [name] ?    Comment:  Procedure: COLONOSCOPY WITH PROPOFOL;  Surgeon: Herbie [place] T ?             Vira Agar, MD;  Location: [place];  Service:  ?             Endoscopy;  Laterality: [name]; ?No date: CORONARY ARTERY BYPASS GRAFT ?02/02/2020: CORONARY STENT INTERVENTION; [name] ?    Comment:  Procedure: CORONARY STENT INTERVENTION;  Surgeon:  ?             Isaias Cowman, MD;  Location: [place] CV  ?              LAB;  Service: Cardiovascular;  Laterality: [name]; ?02/29/2020: IRRIGATION AND DEBRIDEMENT FOOT; Left ?    Comment:  Procedure: IRRIGATION AND DEBRIDEMENT FOOT;  Surgeon:  ?             Caroline More, DPM;  Location: [facility];  Service:  ?             Podiatry;  Laterality: Left; ?02/24/2020: IRRIGATION AND DEBRIDEMENT FOOT; Left ?    Comment:  Procedure: IRRIGATION AND DEBRIDEMENT FOOT;  Surgeon:  ?             Caroline More, DPM;  Location: [facility];  Service:  ?             Podiatry;  Laterality: Left; ?No date: KNEE ARTHROSCOPY ?02/02/2020: LEFT HEART CATH AND CORS/GRAFTS ANGIOGRAPHY; [name] ?    Comment:  Procedure: LEFT HEART CATH AND CORS/GRAFTS ANGIOGRAPHY;  ?             Surgeon: Teodoro Spray, MD;  Location: [place] INVASIVE CV ?             LAB;  Service: Cardiovascular;  Laterality: [name]; ?02/25/2020: LOWER EXTREMITY ANGIOGRAPHY; Left ?    Comment:  Procedure: Lower Extremity Angiography;  Surgeon: Lucky Cowboy,  ?             Erskine Squibb, MD;  Location: [place] CV LAB;  Service:  ?             Cardiovascular;  Laterality: Left; ?01/04/2021: LOWER EXTREMITY ANGIOGRAPHY; Left ?    Comment:  Procedure: LOWER EXTREMITY ANGIOGRAPHY;  Surgeon: Lucky Cowboy,  ?             Erskine Squibb, MD;  Location: [place] CV LAB;  Service:  ?             Cardiovascular;  Laterality: Left; ?01/10/2021: METATARSAL HEAD EXCISION; Left ?    Comment:  Procedure: METATARSAL HEAD EXCISION - LEFT 5th;   ?             Surgeon: Caroline More, DPM;  Location: [facility];   ?             Service: Podiatry;  Laterality: Left; ?01/24/2016: PERIPHERAL VASCULAR CATHETERIZATION; Right ?    Comment:  Procedure: Lower Extremity Angiography;  Surgeon:  ?             Katha Cabal, MD;  Location: [place] CV LAB;   ?  Service: Cardiovascular;  Laterality: Right; ?01/25/2016: PERIPHERAL VASCULAR CATHETERIZATION; Right ?    Comment:  Procedure: Lower Extremity Angiography;  Surgeon:  ?             Katha Cabal, MD;  Location: [place] CV  LAB;   ?             Service: Cardiovascular;  Laterality: Right; ?No date: TOE AMPUTATION ?No date: TONSILLECTOMY ? ?BMI   ? Body Mass Index: 42.05 kg/m?  ?  ? ? Reproductive/Obstetrics ?negative OB ROS ? ?  ? ? ? ? ? ? ? ? ? ? ? ? ? ?  ?  ? ? ? ? ? ? ? ?Anesthesia Physical ?Anesthesia Plan ? ?ASA: 3 ? ?Anesthesia Plan: General LMA  ? ?Post-op Pain Management: Regional block*, Minimal or no pain anticipated and Ofirmev IV (intra-op)*  ? ?Induction: Intravenous ? ?PONV Risk Score and Plan: Dexamethasone, Ondansetron and Treatment may vary due to age or medical condition ? ?Airway Management Planned: LMA ? ?Additional Equipment:  ? ?Intra-op Plan:  ? ?Post-operative Plan: Extubation in OR ? ?Informed Consent: I have reviewed the patients History and Physical, chart, labs and discussed the procedure including the risks, benefits and alternatives for the proposed anesthesia with the patient or authorized representative who has indicated his/her understanding and acceptance.  ? ? ? ?Dental Advisory Given ? ?Plan Discussed with: Anesthesiologist, CRNA and Surgeon ? ?Anesthesia Plan Comments:   ? ? ? ? ? ? ?Anesthesia Quick Evaluation ? ?

## 2021-08-08 ENCOUNTER — Inpatient Hospital Stay: Payer: Medicare Other

## 2021-08-08 ENCOUNTER — Encounter: Payer: Self-pay | Admitting: Podiatry

## 2021-08-08 ENCOUNTER — Inpatient Hospital Stay: Payer: Self-pay

## 2021-08-08 DIAGNOSIS — L089 Local infection of the skin and subcutaneous tissue, unspecified: Secondary | ICD-10-CM

## 2021-08-08 DIAGNOSIS — E119 Type 2 diabetes mellitus without complications: Secondary | ICD-10-CM | POA: Diagnosis not present

## 2021-08-08 DIAGNOSIS — M86162 Other acute osteomyelitis, left tibia and fibula: Secondary | ICD-10-CM | POA: Diagnosis not present

## 2021-08-08 DIAGNOSIS — I482 Chronic atrial fibrillation, unspecified: Secondary | ICD-10-CM | POA: Diagnosis not present

## 2021-08-08 DIAGNOSIS — E11628 Type 2 diabetes mellitus with other skin complications: Secondary | ICD-10-CM

## 2021-08-08 LAB — GLUCOSE, CAPILLARY
Glucose-Capillary: 224 mg/dL — ABNORMAL HIGH (ref 70–99)
Glucose-Capillary: 235 mg/dL — ABNORMAL HIGH (ref 70–99)
Glucose-Capillary: 240 mg/dL — ABNORMAL HIGH (ref 70–99)
Glucose-Capillary: 248 mg/dL — ABNORMAL HIGH (ref 70–99)
Glucose-Capillary: 267 mg/dL — ABNORMAL HIGH (ref 70–99)
Glucose-Capillary: 288 mg/dL — ABNORMAL HIGH (ref 70–99)
Glucose-Capillary: 90 mg/dL (ref 70–99)

## 2021-08-08 LAB — COMPREHENSIVE METABOLIC PANEL
ALT: 38 U/L (ref 0–44)
AST: 35 U/L (ref 15–41)
Albumin: 2.6 g/dL — ABNORMAL LOW (ref 3.5–5.0)
Alkaline Phosphatase: 246 U/L — ABNORMAL HIGH (ref 38–126)
Anion gap: 8 (ref 5–15)
BUN: 49 mg/dL — ABNORMAL HIGH (ref 8–23)
CO2: 26 mmol/L (ref 22–32)
Calcium: 8.3 mg/dL — ABNORMAL LOW (ref 8.9–10.3)
Chloride: 101 mmol/L (ref 98–111)
Creatinine, Ser: 1.2 mg/dL (ref 0.61–1.24)
GFR, Estimated: >60 mL/min (ref >60)
Glucose, Bld: 249 mg/dL — ABNORMAL HIGH (ref 70–99)
Potassium: 5 mmol/L (ref 3.5–5.1)
Sodium: 135 mmol/L (ref 135–145)
Total Bilirubin: 0.7 mg/dL (ref 0.3–1.2)
Total Protein: 7 g/dL (ref 6.5–8.1)

## 2021-08-08 LAB — CBC WITH DIFFERENTIAL/PLATELET
Abs Immature Granulocytes: 0.03 10*3/uL (ref 0.00–0.07)
Basophils Absolute: 0 10*3/uL (ref 0.0–0.1)
Basophils Relative: 1 %
Eosinophils Absolute: 0.2 10*3/uL (ref 0.0–0.5)
Eosinophils Relative: 3 %
HCT: 34.6 % — ABNORMAL LOW (ref 39.0–52.0)
Hemoglobin: 10.2 g/dL — ABNORMAL LOW (ref 13.0–17.0)
Immature Granulocytes: 0 %
Lymphocytes Relative: 11 %
Lymphs Abs: 0.8 10*3/uL (ref 0.7–4.0)
MCH: 26.5 pg (ref 26.0–34.0)
MCHC: 29.5 g/dL — ABNORMAL LOW (ref 30.0–36.0)
MCV: 89.9 fL (ref 80.0–100.0)
Monocytes Absolute: 0.7 10*3/uL (ref 0.1–1.0)
Monocytes Relative: 9 %
Neutro Abs: 5.6 10*3/uL (ref 1.7–7.7)
Neutrophils Relative %: 76 %
Platelets: 209 10*3/uL (ref 150–400)
RBC: 3.85 MIL/uL — ABNORMAL LOW (ref 4.22–5.81)
RDW: 16.4 % — ABNORMAL HIGH (ref 11.5–15.5)
WBC: 7.4 10*3/uL (ref 4.0–10.5)
nRBC: 0 % (ref 0.0–0.2)

## 2021-08-08 LAB — CK: Total CK: 27 U/L — ABNORMAL LOW (ref 49–397)

## 2021-08-08 MED ORDER — TRAMADOL HCL 50 MG PO TABS: 100.0000 mg | ORAL_TABLET | Freq: Four times a day (QID) | ORAL | Status: DC | PRN

## 2021-08-08 MED ORDER — SODIUM CHLORIDE 0.9% FLUSH: 10.0000 mL | INTRAVENOUS | Status: DC | PRN

## 2021-08-08 MED ORDER — MORPHINE SULFATE (PF) 2 MG/ML IV SOLN
2.0000 mg | INTRAVENOUS | Status: DC | PRN
  Administered 2021-08-08 – 2021-08-11 (×5): 2 mg via INTRAVENOUS
  Filled 2021-08-08 (×6): qty 1

## 2021-08-08 MED ORDER — SODIUM CHLORIDE 0.9 % IV SOLN
3.0000 g | Freq: Four times a day (QID) | INTRAVENOUS | Status: DC
  Administered 2021-08-08 – 2021-08-09 (×4): 3 g via INTRAVENOUS
  Filled 2021-08-08 (×4): qty 3
  Filled 2021-08-08: qty 8

## 2021-08-08 MED ORDER — INSULIN ASPART 100 UNIT/ML IJ SOLN
4.0000 [IU] | Freq: Three times a day (TID) | INTRAMUSCULAR | Status: DC
  Administered 2021-08-08 – 2021-08-10 (×7): 4 [IU] via SUBCUTANEOUS
  Filled 2021-08-08 (×7): qty 1

## 2021-08-08 MED ORDER — INSULIN GLARGINE-YFGN 100 UNIT/ML ~~LOC~~ SOLN
35.0000 [IU] | Freq: Two times a day (BID) | SUBCUTANEOUS | Status: DC
  Administered 2021-08-08 – 2021-08-10 (×5): 35 [IU] via SUBCUTANEOUS
  Filled 2021-08-08 (×7): qty 0.35

## 2021-08-08 MED ORDER — PRAMIPEXOLE DIHYDROCHLORIDE 1 MG PO TABS
2.0000 mg | ORAL_TABLET | Freq: Every day | ORAL | Status: DC
Start: 2021-08-08 — End: 2021-08-11
  Administered 2021-08-08 – 2021-08-10 (×3): 2 mg via ORAL
  Filled 2021-08-08 (×3): qty 2

## 2021-08-08 MED ORDER — INSULIN ASPART 100 UNIT/ML IJ SOLN
0.0000 [IU] | Freq: Three times a day (TID) | INTRAMUSCULAR | Status: DC
  Administered 2021-08-08: 7 [IU] via SUBCUTANEOUS
  Administered 2021-08-09: 11 [IU] via SUBCUTANEOUS
  Administered 2021-08-09: 4 [IU] via SUBCUTANEOUS
  Administered 2021-08-09: 11 [IU] via SUBCUTANEOUS
  Administered 2021-08-10 (×2): 3 [IU] via SUBCUTANEOUS
  Administered 2021-08-11: 4 [IU] via SUBCUTANEOUS
  Filled 2021-08-08 (×7): qty 1

## 2021-08-08 MED ORDER — PRIMIDONE 250 MG PO TABS
250.0000 mg | ORAL_TABLET | Freq: Two times a day (BID) | ORAL | Status: DC
Start: 2021-08-08 — End: 2021-08-11
  Administered 2021-08-08 – 2021-08-11 (×7): 250 mg via ORAL
  Filled 2021-08-08 (×7): qty 1

## 2021-08-08 MED ORDER — INSULIN ASPART 100 UNIT/ML IJ SOLN
0.0000 [IU] | Freq: Every day | INTRAMUSCULAR | Status: DC
  Administered 2021-08-08 – 2021-08-09 (×2): 2 [IU] via SUBCUTANEOUS
  Filled 2021-08-08 (×2): qty 1

## 2021-08-08 MED ORDER — SODIUM CHLORIDE 0.9% FLUSH
10.0000 mL | Freq: Two times a day (BID) | INTRAVENOUS | Status: DC
  Administered 2021-08-11: 10 mL

## 2021-08-08 MED ORDER — CHLORHEXIDINE GLUCONATE CLOTH 2 % EX PADS
6.0000 | MEDICATED_PAD | Freq: Every day | CUTANEOUS | Status: DC
  Administered 2021-08-09 – 2021-08-11 (×3): 6 via TOPICAL

## 2021-08-08 MED ORDER — APIXABAN 5 MG PO TABS
5.0000 mg | ORAL_TABLET | Freq: Two times a day (BID) | ORAL | Status: DC
  Administered 2021-08-08 – 2021-08-11 (×6): 5 mg via ORAL
  Filled 2021-08-08 (×6): qty 1

## 2021-08-08 NOTE — Progress Notes (Signed)
1 Day Post-Op  ? ?Subjective/Chief Complaint: ?Patient seen.  Some pain overnight.  Doing well currently. ? ? ?Objective: ?Vital signs in last 24 hours: ?Temp:  [97 ?F (36.1 ?C)-98.6 ?F (37 ?C)] 97.8 ?F (36.6 ?C) (04/11 0802) ?Pulse Rate:  [105-111] 109 (04/11 0802) ?Resp:  [14-19] 16 (04/11 0802) ?BP: (102-134)/(72-88) 130/86 (04/11 0802) ?SpO2:  [91 %-97 %] 95 % (04/11 0802) ?Weight:  [158.8 kg] 158.8 kg (04/10 1558) ?Last BM Date : 08/06/21 ? ?Intake/Output from previous day: ?04/10 0701 - 04/11 0700 ?In: 120 [P.O.:120] ?Out: 1150 [Urine:1150] ?Intake/Output this shift: ?Total I/O ?In: 240 [P.O.:240] ?Out: -  ? ?Some moderate to heavy bleeding is noted on the bandaging with some mild strikethrough.  Upon removal at the base layers there is noted to be some purulence with malodor.  Erythema and edema have significantly improved. ? ? ? ? ?Lab Results:  ?No results for input(s): WBC, HGB, HCT, PLT in the last 72 hours. ?BMET ?No results for input(s): NA, K, CL, CO2, GLUCOSE, BUN, CREATININE, CALCIUM in the last 72 hours. ?PT/INR ?No results for input(s): LABPROT, INR in the last 72 hours. ?ABG ?No results for input(s): PHART, HCO3 in the last 72 hours. ? ?Invalid input(s): PCO2, PO2 ? ?Studies/Results: ?DG Os Calcis Left ? ?Result Date: 08/07/2021 ?CLINICAL DATA:  Postop EXAM: LEFT FOOT - 2 VIEW; LEFT OS CALCIS - 2+ VIEW COMPARISON:  08/04/2021 FINDINGS: Status post interval plantar heel wound debridement and antibiotic bead packing. No fracture or dislocation. Small plantar and Achilles calcaneal spurs. Status post prior transmetatarsal amputation of the left third digit and distal left fifth metatarsal. IMPRESSION: Status post interval plantar heel wound debridement and antibiotic bead packing. No fracture or dislocation. Electronically Signed   By: Delanna Ahmadi M.D.   On: 08/07/2021 19:13  ? ?DG Foot 2 Views Left ? ?Result Date: 08/07/2021 ?CLINICAL DATA:  Postop EXAM: LEFT FOOT - 2 VIEW; LEFT OS CALCIS - 2+ VIEW  COMPARISON:  08/04/2021 FINDINGS: Status post interval plantar heel wound debridement and antibiotic bead packing. No fracture or dislocation. Small plantar and Achilles calcaneal spurs. Status post prior transmetatarsal amputation of the left third digit and distal left fifth metatarsal. IMPRESSION: Status post interval plantar heel wound debridement and antibiotic bead packing. No fracture or dislocation. Electronically Signed   By: Delanna Ahmadi M.D.   On: 08/07/2021 19:13  ? ?[place] EKG SITE RITE ? ?Result Date: 08/08/2021 ?If [employer] not attached, placement could not be confirmed due to current cardiac rhythm.  ? ?Anti-infectives: ?Anti-infectives (From admission, onward)  ? ? Start     Dose/Rate Route Frequency Ordered Stop  ? 08/07/21 1658  vancomycin (VANCOCIN) powder  Status:  Discontinued       ?   As needed 08/07/21 1658 08/07/21 1753  ? 08/05/21 0800  vancomycin (VANCOREADY) IVPB 1750 mg/350 mL  Status:  Discontinued       ? 1,750 mg ?175 mL/hr over 120 Minutes Intravenous Every 12 hours 08/04/21 2043 08/05/21 1235  ? 08/05/21 0300  ceFEPIme (MAXIPIME) 2 g in sodium chloride 0.9 % 100 mL IVPB  Status:  Discontinued       ? 2 g ?200 mL/hr over 30 Minutes Intravenous Every 8 hours 08/04/21 2043 08/05/21 1235  ? 08/04/21 1900  vancomycin (VANCOREADY) IVPB 1500 mg/300 mL       ? 1,500 mg ?150 mL/hr over 120 Minutes Intravenous  Once 08/04/21 1852 08/04/21 2133  ? 08/04/21 1715  ceFEPIme (MAXIPIME) 2  g in sodium chloride 0.9 % 100 mL IVPB       ? 2 g ?200 mL/hr over 30 Minutes Intravenous  Once 08/04/21 1706 08/04/21 1820  ? 08/04/21 1715  vancomycin (VANCOCIN) IVPB 1000 mg/200 mL premix       ? 1,000 mg ?200 mL/hr over 60 Minutes Intravenous  Once 08/04/21 1706 08/04/21 1933  ? ?  ? ? ?Assessment/Plan: ?s/p Procedure(s): ?INCISION AND DRAINAGE-Partial Calcanectomy (Left) ?Assessment: Full-thickness ulceration with osteomyelitis left heel status post debridement, condition guarded. ? ?Plan: Patient seen in  conjunction with wound care who are preparing to place a wound VAC.  Also seen in conjunction with infectious disease who will manage PICC line and IV antibiotics.  We will continue to monitor his progression. ? LOS: 3 days  ? ? ?Durward Fortes ?08/08/2021 ? ?

## 2021-08-08 NOTE — Evaluation (Signed)
Occupational Therapy Evaluation ?Patient Details ?Name: [name]. ?MRN: [id number] ?DOB: 10-Sep-1955 ?Today's Date: 08/08/2021 ? ? ?History of Present Illness Pt is a 67 y.o. male presenting to hospital 4/7 from wound clinic d/t concern of cellulitis and failing outpatient therapy.  Pt admitted with cellulitis, chronic a-fib, delayed wound healing (L heel).  PMH includes htn, IDDM, morbid obesity, depression, CAD s/p CABG 2006 and PCI to RCA in 12/2020, chronic B venous stasis on Unna boots, chronic L foot ulcer, COPD, OSA on CPAP nightly, ai-fib, PAD, s/p multiple toe amputations.  ? ?Clinical Impression ?  ?Patient presenting with decreased Ind in self care, balance, functional mobility/transfers, endurance, and safety awareness. Patient reports living at home with wife who has been helping but it has been getting more difficulty for her to do so per pt. Pt reports some ambulation with RW and use of knee scooter at home PTA. Pt engages in sink bathing with assist for LB dressing from wife at baseline. Patient currently needing mod A to stand from recliner chair and unable to maintain weight bearing status and also unable to hop with RW.  Patient will benefit from acute OT to increase overall independence in the areas of ADLs, functional mobility, and safety awareness in order to safely discharge to next venue of care.  ?   ? ?Recommendations for follow up therapy are one component of a multi-disciplinary discharge planning process, led by the attending physician.  Recommendations may be updated based on patient status, additional functional criteria and insurance authorization.  ? ?Follow Up Recommendations ? Skilled nursing-short term rehab (<3 hours/day)  ?  ?Assistance Recommended at Discharge Frequent or constant Supervision/Assistance  ?Patient can return home with the following A lot of help with walking and/or transfers;A lot of help with bathing/dressing/bathroom;Assistance with  cooking/housework;Help with stairs or ramp for entrance ? ?  ?Functional Status Assessment ? Patient has had a recent decline in their functional status and demonstrates the ability to make significant improvements in function in a reasonable and predictable amount of time.  ?Equipment Recommendations ? Other (comment) (defer to next venue of care)  ?  ?   ?Precautions / Restrictions Precautions ?Precautions: Fall ?Restrictions ?Weight Bearing Restrictions: Yes ?LLE Weight Bearing: Non weight bearing ?Other Position/Activity Restrictions: NWB L LE (except toe touch transfers L LE)  ? ?  ? ?Mobility Bed Mobility ?  ?  ?  ?  ?  ?  ?  ?General bed mobility comments: Pt seated in recliner chair ?  ? ?Transfers ?Overall transfer level: Needs assistance ?Equipment used: Rolling walker (2 wheels) ?Transfers: Sit to/from Stand ?Sit to Stand: Mod assist ?  ?  ?  ?  ?  ?General transfer comment: Mod A to stand from recliner chair and unable to maintain weight bearing precaution. ?  ? ?  ?Balance Overall balance assessment: Needs assistance ?Sitting-balance support: No upper extremity supported, Feet supported ?Sitting balance-Leahy Scale: Normal ?Sitting balance - Comments: steady sitting reaching outside BOS ?  ?Standing balance support: Bilateral upper extremity supported, Reliant on assistive device for balance ?Standing balance-Leahy Scale: Poor ?  ?  ?  ?  ?  ?  ?  ?  ?  ?  ?  ?  ?   ? ?ADL either performed or assessed with clinical judgement  ? ?ADL Overall ADL's : Needs assistance/impaired ?  ?  ?Grooming: Wash/dry hands;Wash/dry face;Sitting;Set up;Supervision/safety ?  ?  ?  ?  ?  ?  ?  ?  ?  ?  ?  Toilet Transfer Details (indicate cue type and reason): unable to hop and maintain NWB status ?  ?  ?  ?  ?  ?   ? ? ? ?Vision Patient Visual Report: No change from baseline ?   ?   ?   ?   ? ?Pertinent Vitals/Pain Pain Assessment ?Pain Assessment: 0-10 ?Pain Score: 9  ?Pain Location: L heel ?Pain Descriptors / Indicators:  Sore, Aching ?Pain Intervention(s): Monitored during session, Limited activity within patient's tolerance, Repositioned  ? ? ? ?Hand Dominance Right ?  ?Extremity/Trunk Assessment Upper Extremity Assessment ?Upper Extremity Assessment: Overall WFL for tasks assessed ?  ?Lower Extremity Assessment ?Lower Extremity Assessment: Defer to PT evaluation ?  ?  ?  ?Communication Communication ?Communication: No difficulties ?  ?Cognition Arousal/Alertness: Awake/alert ?Behavior During Therapy: [facility] for tasks assessed/performed ?Overall Cognitive Status: Within Functional Limits for tasks assessed ?  ?  ?  ?  ?  ?  ?  ?  ?  ?  ?  ?  ?  ?  ?  ?  ?  ?  ?  ?   ?   ?   ? ? ?Home Living Family/patient expects to be discharged to:: Private residence ?Living Arrangements: Spouse/significant other ?Available Help at Discharge: Family;Available PRN/intermittently ?Type of Home: House ?Home Access: Stairs to enter ?Entrance Stairs-Number of Steps: 4 ?Entrance Stairs-Rails: Can reach both;Left;Right ?Home Layout: One level;Other (Comment) (Pt reports 4 steps within the home to living room area) ?  ?  ?Bathroom Shower/Tub: Tub/shower unit;Sponge bathes at baseline ?  ?Bathroom Toilet: Handicapped height ?  ?  ?Home Equipment: Conservation officer, nature (2 wheels);Cane - quad;Shower seat - built in ?  ?Additional Comments: Bariatric RW; knee scooter ?  ? ?  ?Prior Functioning/Environment Prior Level of Function : Independent/Modified Independent ?  ?  ?  ?  ?  ?  ?Mobility Comments: Pt reports he has been ambulating with RW or using knee scooter ( mostly in community) ?ADLs Comments: wife assists for LB dressing. He has been washing at sink to keep LE dry prior to admission. ?  ? ?  ?  ?OT Problem List: Decreased strength;Decreased activity tolerance;Impaired balance (sitting and/or standing);Decreased safety awareness;Pain;Decreased knowledge of precautions;Decreased knowledge of use of DME or AE ?  ?   ?OT Treatment/Interventions: Self-care/ADL  training;Manual therapy;Energy conservation;Therapeutic activities;DME and/or AE instruction  ?  ?OT Goals(Current goals can be found in the care plan section) Acute Rehab OT Goals ?Patient Stated Goal: to get stronger ?OT Goal Formulation: With patient ?Time For Goal Achievement: 08/22/21 ?Potential to Achieve Goals: Good ?ADL Goals ?Pt Will Perform Grooming: with supervision;sitting ?Pt Will Perform Lower Body Dressing: with min assist ?Pt Will Transfer to Toilet: stand pivot transfer;with supervision ?Pt Will Perform Toileting - Clothing Manipulation and hygiene: with min guard assist;sit to/from stand  ?OT Frequency: Min 2X/week ?  ? ?   ?AM-PAC OT "6 Clicks" Daily Activity     ?Outcome Measure Help from another person eating meals?: None ?Help from another person taking care of personal grooming?: A Little ?Help from another person toileting, which includes using toliet, bedpan, or urinal?: A Lot ?  ?Help from another person to put on and taking off regular upper body clothing?: A Little ?Help from another person to put on and taking off regular lower body clothing?: A Lot ?6 Click Score: 14 ?  ?End of Session Equipment Utilized During Treatment: Rolling walker (2 wheels) ?Nurse Communication: Mobility status ? ?Activity Tolerance: Patient  limited by pain ?Patient left: in bed;with call bell/phone within reach ? ?OT Visit Diagnosis: Unsteadiness on feet (R26.81);Repeated falls (R29.6);Muscle weakness (generalized) (M62.81)  ?              ?Time: 7493-5521 ?OT Time Calculation (min): 20 min ?Charges:  OT General Charges ?$OT Visit: 1 Visit ?OT Evaluation ?$OT Eval Moderate Complexity: 1 Mod ?OT Treatments ?$Therapeutic Activity: 8-22 mins ? ?Darleen Crocker, [place], OTR/L , CBIS ?ascom [phone]  ?08/08/21, 12:55 PM  ?

## 2021-08-08 NOTE — Care Management Important Message (Signed)
Important Message ? ?Patient Details  ?Name: [name]. ?MRN: [id number] ?Date of Birth: 03-31-1956 ? ? ?Medicare Important Message Given:  Yes ? ? ? ? ?Juliann Pulse A Pius Byrom ?08/08/2021, 12:27 PM ?

## 2021-08-08 NOTE — TOC Progression Note (Signed)
Transition of Care (TOC) - Progression Note  ? ? ?Patient Details  ?Name: [name]. ?MRN: [id number] ?Date of Birth: [birth year] ? ?Transition of Care (TOC) CM/SW Contact  ?Pete Pelt, RN ?Phone Number: ?08/08/2021, 11:16 AM ? ?Clinical Narrative:   Physical Therapy recommends Home Health, Patient is open with [facility], orders requested from MD.  Potential for continued need for IV antibiotics per care team.  [facility] pharmacy notified and notified P. Chandler.  TOC to follow. ? ? ? ?  ?  ? ?Expected Discharge Plan and Services ?  ?  ?  ?  ?  ?                ?  ?  ?  ?  ?  ?  ?  ?  ?  ?  ? ? ?Social Determinants of Health (SDOH) Interventions ?  ? ?Readmission Risk Interventions ?   ? View : No data to display.  ?  ?  ?  ? ? ?

## 2021-08-08 NOTE — Progress Notes (Signed)
Peripherally Inserted Central Catheter Placement ? ?The IV Nurse has discussed with the patient and/or persons authorized to consent for the patient, the purpose of this procedure and the potential benefits and risks involved with this procedure.  The benefits include less needle sticks, lab draws from the catheter, and the patient may be discharged home with the catheter. Risks include, but not limited to, infection, bleeding, blood clot (thrombus formation), and puncture of an artery; nerve damage and irregular heartbeat and possibility to perform a PICC exchange if needed/ordered by physician.  Alternatives to this procedure were also discussed.  Bard Power PICC patient education guide, fact sheet on infection prevention and patient information card has been provided to patient /or left at bedside.   ? ?PICC Placement Documentation  ?PICC Single Lumen 08/08/21 Right Brachial 44 cm 0 cm (Active)  ?Indication for Insertion or Continuance of Line Home intravenous therapies (PICC only) 08/08/21 1847  ?Exposed Catheter (cm) 0 cm 08/08/21 1847  ?Site Assessment Clean, Dry, Intact 08/08/21 1847  ?Line Status Flushed;Saline locked;Blood return noted 08/08/21 1847  ?Dressing Type Transparent;Securing device 08/08/21 1847  ?Dressing Status Antimicrobial disc in place;Clean, Dry, Intact 08/08/21 1847  ?Safety Lock Not Applicable 86/76/19 5093  ?Dressing Intervention New dressing;Other (Comment) 08/08/21 1847  ?Dressing Change Due 08/15/21 08/08/21 1847  ? ? ? ? ? ?Enos Fling ?08/08/2021, 6:49 PM ? ?

## 2021-08-08 NOTE — Anesthesia Postprocedure Evaluation (Signed)
Anesthesia Post Note ? ?Patient: [name]. ? ?Procedure(s) Performed: INCISION AND DRAINAGE-Partial Calcanectomy (Left: Heel) ? ?Patient location during evaluation: PACU ?Anesthesia Type: General ?Level of consciousness: awake and alert ?Pain management: pain level controlled ?Vital Signs Assessment: post-procedure vital signs reviewed and stable ?Respiratory status: spontaneous breathing, nonlabored ventilation and respiratory function stable ?Cardiovascular status: blood pressure returned to baseline, stable and tachycardic ?Postop Assessment: no apparent nausea or vomiting ?Anesthetic complications: no ? ? ?No notable events documented. ? ? ?Last Vitals:  ?Vitals:  ? 08/08/21 0420 08/08/21 0802  ?BP: 131/88 130/86  ?Pulse: (!) 109 (!) 109  ?Resp: 18 16  ?Temp: 36.4 ?C 36.6 ?C  ?SpO2: 93% 95%  ?  ?Last Pain:  ?Vitals:  ? 08/08/21 0852  ?TempSrc:   ?PainSc: 7   ? ? ?  ?  ?  ?  ?  ?  ? ?[name] ? ? ? ? ?

## 2021-08-08 NOTE — Progress Notes (Addendum)
Physical Therapy Treatment ?Patient Details ?Name: [name]. ?MRN: [id number] ?DOB: 18-Feb-1956 ?Today's Date: 08/08/2021 ? ? ?History of Present Illness Pt is a 66 y.o. male presenting to hospital 4/7 from wound clinic d/t concern of cellulitis and failing outpatient therapy.  Pt admitted with cellulitis, chronic a-fib, delayed wound healing (L heel).  PMH includes htn, IDDM, morbid obesity, depression, CAD s/p CABG 2006 and PCI to RCA in 12/2020, chronic B venous stasis on Unna boots, chronic L foot ulcer, COPD, OSA on CPAP nightly, ai-fib, PAD, s/p multiple toe amputations. ? ?  ?PT Comments  ? ? Physical Therapy Treatment completed this date. Patient tolerated session well and was agreeable to treatment. Wbing precautions reviewed with patient again today, and pain was reported at 9/10 throughout session. Patient continues to be able to complete bed mobility at Mod I with increased time/effort and minimal use of bed rails. Sit to stand and stand pivot transfer from EOB>recliner was completed at [facility] with RW and elevated bed height. Patient able to maintain tow touch weight-bearing with transfer to recliner. Supine and EOB therapeutic exercises focused on BLE strengthening. Patient is progressing towards his goals and would continue to benefit from skilled physical therapy in order to optimize patient's return to PLOF. Due to patient's difficulty maintaining weight-bearing precautions, and increased assistance required for transfers, recommend STR upon discharge from acute hospitalization.  ?  ?Recommendations for follow up therapy are one component of a multi-disciplinary discharge planning process, led by the attending physician.  Recommendations may be updated based on patient status, additional functional criteria and insurance authorization. ? ?Follow Up Recommendations ? Skilled nursing-short term rehab (<3 hours/day) ?  ?  ?Assistance Recommended at Discharge Frequent or constant  Supervision/Assistance  ?Patient can return home with the following A little help with walking and/or transfers;A little help with bathing/dressing/bathroom;Assistance with cooking/housework;Assist for transportation;Help with stairs or ramp for entrance ?  ?Equipment Recommendations ? None recommended by PT (at this time, patient has recommend DME at home)  ?  ?Recommendations for Other Services   ? ? ?  ?Precautions / Restrictions Precautions ?Precautions: Fall ?Restrictions ?Weight Bearing Restrictions: Yes ?LLE Weight Bearing: Non weight bearing ?Other Position/Activity Restrictions: NWB L LE (except toe touch transfers L LE)  ?  ? ?Mobility ? Bed Mobility ?Overal bed mobility: Modified Independent ?  ?  ?  ?  ?  ?  ?General bed mobility comments: Semi-supine to sitting edge of bed ?  ? ?Transfers ?Overall transfer level: Needs assistance ?Equipment used: Rolling walker (2 wheels) ?Transfers: Sit to/from Stand, Bed to chair/wheelchair/BSC ?Sit to Stand: Min guard ?  ?Step pivot transfers: Min guard ?  ?  ?  ?General transfer comment: bed height elevated to stand from bed x1 patient demonstrated good recall on hand placement (stand step turn with walker and toe touch L LE) ?  ? ?Ambulation/Gait ?  ?  ?  ?  ?  ?  ?  ?General Gait Details: Deferred d/t concerns of pt being able to maintain [place] status ? ? ?Stairs ?  ?  ?  ?  ?  ? ? ?Wheelchair Mobility ?  ? ?Modified Rankin (Stroke Patients Only) ?  ? ? ?  ?Balance Overall balance assessment: Needs assistance ?Sitting-balance support: No upper extremity supported, Feet supported ?Sitting balance-Leahy Scale: Normal ?Sitting balance - Comments: steady sitting reaching outside BOS ?  ?Standing balance support: Bilateral upper extremity supported, Reliant on assistive device for balance ?Standing balance-Leahy Scale: Good ?Standing  balance comment: steady with B UE support on RW ?  ?  ?  ?  ?  ?  ?  ?  ?  ?  ?  ?  ? ?  ?Cognition Arousal/Alertness:  Awake/alert ?Behavior During Therapy: [facility] for tasks assessed/performed ?Overall Cognitive Status: Within Functional Limits for tasks assessed ?  ?  ?  ?  ?  ?  ?  ?  ?  ?  ?  ?  ?  ?  ?  ?  ?  ?  ?  ? ?  ?Exercises General Exercises - Lower Extremity ?Long Arc Quad: Seated, 10 reps, AROM, Both ?Heel Slides: Supine, Right, AROM, Left, AAROM, 10 reps ?Straight Leg Raises: Supine, 15 reps, AROM, Both ?Hip Flexion/Marching: Seated, 10 reps, AROM, Both ? ?  ?General Comments   ?  ?  ? ?Pertinent Vitals/Pain Pain Assessment ?Pain Assessment: 0-10 ?Pain Score: 9  ?Faces Pain Scale: Hurts a little bit ?Pain Location: L heel ?Pain Descriptors / Indicators: Sore ?Pain Intervention(s): Monitored during session, Limited activity within patient's tolerance, Repositioned  ? ? ?Home Living   ?  ?  ?  ?  ?  ?  ?  ?  ?  ?   ?  ?Prior Function    ?  ?  ?   ? ?PT Goals (current goals can now be found in the care plan section) Acute Rehab PT Goals ?Patient Stated Goal: to go home ?PT Goal Formulation: With patient ?Time For Goal Achievement: 08/19/21 ?Potential to Achieve Goals: Good ?Additional Goals ?Additional Goal #1: Pt with be able to navigate knee scooter safely >120 feet. ?Progress towards PT goals: Progressing toward goals ? ?  ?Frequency ? ? ? Min 2X/week ? ? ? ?  ?PT Plan Discharge plan needs to be updated  ? ? ?Co-evaluation   ?  ?  ?  ?  ? ?  ?AM-PAC PT "6 Clicks" Mobility   ?Outcome Measure ? Help needed turning from your back to your side while in a flat bed without using bedrails?: None ?Help needed moving from lying on your back to sitting on the side of a flat bed without using bedrails?: None ?Help needed moving to and from a bed to a chair (including a wheelchair)?: A Little ?Help needed standing up from a chair using your arms (e.g., wheelchair or bedside chair)?: A Lot ?Help needed to walk in hospital room?: A Lot ?Help needed climbing 3-5 steps with a railing? : A Lot ?6 Click Score: 16 ? ?  ?End of Session  Equipment Utilized During Treatment: Gait belt ?Activity Tolerance: Patient tolerated treatment well ?Patient left: in chair;with call bell/phone within reach;with chair alarm set ?Nurse Communication: Mobility status ?PT Visit Diagnosis: Other abnormalities of gait and mobility (R26.89);Muscle weakness (generalized) (M62.81);Pain ?Pain - Right/Left: Left ?Pain - part of body: Ankle and joints of foot ?  ? ? ?Time: 0092-3300 ?PT Time Calculation (min) (ACUTE ONLY): 19 min ? ?Charges:  $Therapeutic Exercise: 8-22 mins          ?          ? ?Iva Boop, PT  ?08/08/21. 10:22 AM ? ? ?

## 2021-08-08 NOTE — Consult Note (Signed)
[facility] Nurse Consult Note: ? ?Supplies ordered to room:  3 Medium NPWT dressing kits, 1 VAC pump, 1 Cannister, 6 skin barrier rings. ? ?Dressing change later today. ? ?[place] nursing team will follow, and will remain available to this patient, the nursing and medical teams.   ? ?Thanks, ?Maudie Flakes, MSN, RN, [place], [place], CWON-AP, [place]  ?Pager# [phone]  ? ?  ?

## 2021-08-08 NOTE — Progress Notes (Signed)
Inpatient Rehab Admissions Coordinator:  ? ?Per therapy recommendation, patient was screened for CIR candidacy by Myracle Febres, MS, CCC-SLP. At this time, Pt. does not appear to demonstrate medical necessity to justify in hospital rehabilitation/CIR. will not pursue a rehab consult for this Pt.   Recommend other rehab venues to be pursued.  Please contact me with any questions. ? ?Cobey Raineri, MS, CCC-SLP ?Rehab Admissions Coordinator  ?[phone] (celll) ?[phone] (office) ? ?

## 2021-08-08 NOTE — Consult Note (Signed)
[facility] Nurse Consult Note: ?Reason for Consult: Placement of NPWT to left heel. Dr. Luana Shu has instructed to place over adaptic that is sutured in place to secure antibiotic beads. Next dressing change is Friday, 08/11/21. Thereafter, dressing changes are to be M/W/F. ?Wound type: Surgical, infectious ?Pressure Injury POA: [name] ?Measurement:4cm x 6cm with depth unable to be determined due to the presence of adaptic sutured into place. ?Wound bed: Unable to clearly see, appears red, moist ?Drainage (amount, consistency, odor) dried blood on old dressing, no new exudate ?Periwound: partially obscured by adaptic.  ?Dressing procedure/placement/frequency: Drs. Cleda Mccreedy and Ravishankar in to see during visit. I am assisted with the dressing change by the bedside RN, H. Socin. Wife is present for dressing placement. Patient is premedicated. ?Dressing placement: ?Two skin barrier rings are used to encircle the adaptic dressing and sutures to enhance NPWT seal. One (1) piece of black foam used to cover wound, drape applied to secure. One (1) piece of foam used as bridge. This is placed on top of a piece of drape that covers the anterior foot and covered with another piece of drape and connected to wound foam. The dressing is attached to 163mHg continuous negative pressure and an immediate seal is achieved. The tubing is padded and secured by wrapping from toe to calf with Kerlix roll gauze and topped with 6-inch ACE bandage.  ?The right LE wounds are cleansed with NS, patted dry and covered with xeroform gauze. This is secured with Kerlix roll gauze and topped with ACE bandage.  ? ? ?[place] will follow, and will remain available to this patient, the nursing and medical teams.   ?Thanks, ?LMaudie Flakes MSN, RN, [place] CWON-AP, [place] ?Pager# ([phone] ? ? ? ? ? ?  ?

## 2021-08-08 NOTE — Progress Notes (Signed)
[facility] Progress Note ? ?Patient: [name].    XTG:[id number]  DOA: 08/04/2021    ?Date of Service: the patient was seen and examined on 08/08/2021 ? ?Brief hospital course: ?66 year old male with past medical history of poorly controlled diabetes on insulin, hypertension, morbid obesity, COPD, atrial fibrillation on anticoagulation, CAD status post CABG obstructive sleep apnea and PAD with a chronic left foot ulcer who presented to the emergency room after being sent over from the wound clinic for worsening cellulitis of his left foot that was not responding to outpatient antibiotics.  Patient admitted to the hospitalist service and podiatry consulted.  An MRI of the left ankle was done noting early osteomyelitis.  Given concerns for BKA, podiatry took patient on 4/10 for wound debridement and partial calcanectomy.  Postop, they are recommending 6 weeks of IV antibiotics.  Infectious disease consulted.   ? ? ? ?Assessment and Plan: ?Assessment and Plan: ?* Acute osteomyelitis of lower leg, left ([place]) ?High risk for BKA in the future.  Patient is status post debridement and partial calcanectomy 4/10.  Infectious disease consulted.  Plan will be for IV antibiotics for the next 6 weeks.  PICC line ordered. ? ?Cellulitis of left foot ?Chronic nonhealing wound and now with concerns for osteomyelitis.  Initially on vancomycin and cefepime.  Continue wound care.  Initially antibiotics had been put on hold before surgery.  Now plan will be for IV antibiotics x6 weeks.  Wound VAC being placed today.  Patient requesting increase and Ultram for pain ? ?Insulin dependent type 2 diabetes mellitus ([place]) ?Poorly controlled.  A1c of 10.2.  Appreciate diabetic coordinator's help.  Postsurgery, Lantus decreased to 35 units twice a day and resuming resistant sliding scale and adding NovoLog 4 units 3 times daily with meals. ?- Diabetic/carb modified/heart healthy diet ordered ? ?Hypersomnia with sleep  apnea ?Continue nightly CPAP ? ?Atrial fibrillation, chronic ([place]) ?Continued on Cardizem and amiodarone plus metoprolol.  Holding Eliquis till after surgery.  Currently rate controlled and appears to be in sinus at times. ? ?Essential hypertension, benign ?Blood pressure stable during this hospitalization.  Continue ARB and beta-blocker. ? ?CAD (coronary artery disease) ?Stable during this hospitalization.  Continue aspirin and statin plus antihypertensives. ? ? ?Pure hypercholesterolemia ?Continue Zetia ? ?Venous stasis of both lower extremities ?Continue diuretic and Ace wrap. ? ? ?BPH (benign prostatic hyperplasia) ?Continue Flomax ? ?Obesity, Class III, BMI 40-49.9 (morbid obesity) ([place]) ?Estimated body mass index is 46.03 kg/m? as calculated from the following: ?  Height as of this encounter: 6' 4.5" (1.943 m). ?  Weight as of this encounter: 173.8 kg.  ? ?Patient meets criteria BMI greater than 40. ? ?AF (paroxysmal atrial fibrillation) (HCC)-resolved as of 08/04/2021 ?- Resumed home Eliquis 5 mg p.o. twice daily ?- Resumed home diltiazem 300 mg daily before breakfast, amiodarone 200 mg daily before breakfast, metoprolol tartrate 75 mg p.o. twice daily ? ? ? ? ? ? ?Body mass index is 42.05 kg/m?.  ?  ?   ? ?Consultants: ?Podiatry ? ?Procedures: ?Planned partial calcanectomy and wound debridement of left leg/heel ? ?Antimicrobials: ?IV vancomycin and cefepime 4/7 - 4/9 ? ?Code Status: Full code ? ? ?Subjective: Patient doing okay.  H only pain is in his left heel ? ?Objective: ?Tachycardia noted ?Vitals:  ? 08/08/21 0420 08/08/21 0802  ?BP: 131/88 130/86  ?Pulse: (!) 109 (!) 109  ?Resp: 18 16  ?Temp: 97.6 ?F (36.4 ?C) 97.8 ?F (36.6 ?C)  ?SpO2: 93% 95%  ? ? ?  Intake/Output Summary (Last 24 hours) at 08/08/2021 1337 ?Last data filed at 08/08/2021 1022 ?Gross per 24 hour  ?Intake 360 ml  ?Output 1150 ml  ?Net -790 ml  ? ? ?Filed Weights  ? 08/04/21 1538 08/04/21 2033 08/07/21 1558  ?Weight: (!) 161.5 kg (!) 173.8 kg  (!) 158.8 kg  ? ?Body mass index is 42.05 kg/m?. ? ?Exam: ? ?General: Alert and oriented x3, no acute distress ?HEENT: Normocephalic, atraumatic, mucous membranes are moist ?Cardiovascular: Irregular rhythm, borderline tachycardia ?Respiratory: Decreased breath sounds throughout secondary to body habitus ?Abdomen: Soft, nontender, nondistended, positive bowel sounds ?Musculoskeletal: No clubbing or cyanosis, trace pitting edema, left leg in soft boot ?Skin: Nonhealing ulcer for left heel.  Foot wrapped. ?Psychiatry: Appropriate, no evidence of psychoses ?Neurology: No focal deficits, chronic neuropathy ? ?Data Reviewed: ?There are no new results to review at this time. ? ?Disposition:  ?Status is: Inpatient ?Remains inpatient appropriate because: Needs skilled nursing ?  ? ?Anticipated discharge date: 4/12 ? ?Remaining issues to be resolved so that patient can be discharged:  ?Skilled nursing facility placement ?PICC line placement ?Selection of antibiotics ?Wound VAC placement ? ? ?Family Communication: Updated wife by phone. ?DVT Prophylaxis: ?Place TED hose Start: 08/04/21 1827 ?apixaban (ELIQUIS) tablet 5 mg  ? ? ?Author: ?Annita Brod ,MD ?08/08/2021 1:37 PM ? ?To reach On-call, see care teams to locate the attending and reach out via [email or web address]. ?Between 7PM-7AM, please contact night-coverage ?If you still have difficulty reaching the attending provider, please page the [facility] (Director on Call) for [facility] on amion for assistance. ? ?

## 2021-08-08 NOTE — Consult Note (Signed)
NAME: [name].  ?DOB: [birth year]  ?MRN: [id number]  ?Date/Time: 08/08/2021 2:29 PM ? ?REQUESTING PROVIDER: Dr.Cline ?Subjective:  ?REASON FOR CONSULT: Diabetic foot infection ?Chart reviewed.  Spoke to wife at bedside.  Spoke to patient. ?[name]. is a 66 y.o. with a history of  DM, HTN, HLD  CAD, CABG, CHF  diabetic foot infection s/p amputation of toes both feet presents to the ED on 08/04/2021 with worsening left heel ulcer and cellulitis of the foot after being assessed at the wound clinic and they sending him to the ED.  Patient states he has had a heel wound in since January.  It was initially small.  He was asked to apply Santyl.  But he states the visiting nurse applied Santyl around the edge of the wound and it got worse.  He also has severe venous edema and both the legs swell up and superficial blisters formed especially on the lower right leg.  He got Unna boot on the right for some time. ?February 17/23 the left heel culture had Proteus and Enterococcus.  He was treated with Augmentin for 2 weeks following that. ?In the ED on 08/04/2021 temperature 100, BP 124/64, heart rate 118 and sats 93% ?WBC 10.5, Hb 10.8, platelet 203.  He got 1 dose of Vanco and cefepime and it was stopped after that.  He was assessed by podiatrist and was taken for heel debridement.  Heel wound was debrided and the piece of bone was sent for pathology and culture. ?I am asked to see the patient for antibiotic management ? ? ?Past Medical History:  ?Diagnosis Date  ? Arrhythmia   ? atrial fibrillation  ? Asthma   ? CHF (congestive heart failure) ([place])   ? COPD (chronic obstructive pulmonary disease) ([place])   ? Coronary artery disease   ? Depression   ? Diabetes mellitus without complication ([place])   ? Gout   ? History anabolic steroid use   ? Hyperlipidemia   ? Hypertension   ? Hypogonadism in male   ? Morbid obesity ([place])   ? Myocardial infarction [facility])   ? Peripheral vascular disease ([place])   ? Perirectal  abscess   ? Pleurisy   ? Sleep apnea   ? CPAP at night, no oxygen  ? Varicella   ?  ?Past Surgical History:  ?Procedure Laterality Date  ? ABDOMINAL AORTIC ANEURYSM REPAIR    ? ACHILLES TENDON SURGERY Left 01/10/2021  ? Procedure: ACHILLES LENGTHENING/KIDNER;  Surgeon: Caroline More, DPM;  Location: [facility] ORS;  Service: Podiatry;  Laterality: Left;  ? AMPUTATION TOE Right 02/10/2016  ? Procedure: AMPUTATION TOE 3RD TOE;  Surgeon: Samara Deist, DPM;  Location: [facility] ORS;  Service: Podiatry;  Laterality: Right;  ? AMPUTATION TOE Left 02/24/2020  ? Procedure: AMPUTATION TOE;  Surgeon: Caroline More, DPM;  Location: [facility] ORS;  Service: Podiatry;  Laterality: Left;  ? APPLICATION OF WOUND VAC Left 02/29/2020  ? Procedure: APPLICATION OF WOUND VAC;  Surgeon: Caroline More, DPM;  Location: [facility] ORS;  Service: Podiatry;  Laterality: Left;  ? COLONOSCOPY WITH PROPOFOL [name] 11/18/2015  ? Procedure: COLONOSCOPY WITH PROPOFOL;  Surgeon: Manya Silvas, MD;  Location: [facility] ENDOSCOPY;  Service: Endoscopy;  Laterality: [name];  ? CORONARY ARTERY BYPASS GRAFT    ? CORONARY STENT INTERVENTION [name] 02/02/2020  ? Procedure: CORONARY STENT INTERVENTION;  Surgeon: Isaias Cowman, MD;  Location: [place] CV LAB;  Service: Cardiovascular;  Laterality: [name];  ? INCISION AND DRAINAGE Left 08/07/2021  ?  Procedure: INCISION AND DRAINAGE-Partial Calcanectomy;  Surgeon: Caroline More, DPM;  Location: [facility] ORS;  Service: Podiatry;  Laterality: Left;  ? IRRIGATION AND DEBRIDEMENT FOOT Left 02/29/2020  ? Procedure: IRRIGATION AND DEBRIDEMENT FOOT;  Surgeon: Caroline More, DPM;  Location: [facility] ORS;  Service: Podiatry;  Laterality: Left;  ? IRRIGATION AND DEBRIDEMENT FOOT Left 02/24/2020  ? Procedure: IRRIGATION AND DEBRIDEMENT FOOT;  Surgeon: Caroline More, DPM;  Location: [facility] ORS;  Service: Podiatry;  Laterality: Left;  ? KNEE ARTHROSCOPY    ? LEFT HEART CATH AND CORS/GRAFTS ANGIOGRAPHY [name] 02/02/2020  ? Procedure: LEFT HEART CATH AND CORS/GRAFTS  ANGIOGRAPHY;  Surgeon: Teodoro Spray, MD;  Location: [facility] CV LAB;  Service: Cardiovascular;  Laterality: [name];  ? LOWER EXTREMITY ANGIOGRAPHY Left 02/25/2020  ? Procedure: Lower Extremity Angiography;  Surgeon: Algernon Huxley, MD;  Location: [place] CV LAB;  Service: Cardiovascular;  Laterality: Left;  ? LOWER EXTREMITY ANGIOGRAPHY Left 01/04/2021  ? Procedure: LOWER EXTREMITY ANGIOGRAPHY;  Surgeon: Algernon Huxley, MD;  Location: [place] CV LAB;  Service: Cardiovascular;  Laterality: Left;  ? METATARSAL HEAD EXCISION Left 01/10/2021  ? Procedure: METATARSAL HEAD EXCISION - LEFT 5th;  Surgeon: Caroline More, DPM;  Location: [facility] ORS;  Service: Podiatry;  Laterality: Left;  ? PERIPHERAL VASCULAR CATHETERIZATION Right 01/24/2016  ? Procedure: Lower Extremity Angiography;  Surgeon: Katha Cabal, MD;  Location: [place] CV LAB;  Service: Cardiovascular;  Laterality: Right;  ? PERIPHERAL VASCULAR CATHETERIZATION Right 01/25/2016  ? Procedure: Lower Extremity Angiography;  Surgeon: Katha Cabal, MD;  Location: [place] CV [facility];  Service: Cardiovascular;  Laterality: Right;  ? TOE AMPUTATION    ? TONSILLECTOMY    ?  ?Social History  ? ?Socioeconomic History  ? Marital status: Married  ?  Spouse name: Not on file  ? Number of children: Not on file  ? Years of education: Not on file  ? Highest education level: Not on file  ?Occupational History  ? Not on file  ?Tobacco Use  ? Smoking status: Former  ?  Packs/day: 0.50  ?  Years: 45.00  ?  Pack years: 22.50  ?  Types: Cigarettes  ?  Quit date: 04/05/2015  ?  Years since quitting: 6.3  ? Smokeless tobacco: Never  ?Vaping Use  ? Vaping Use: Every day  ?Substance and Sexual Activity  ? Alcohol use: Not Currently  ?  Alcohol/week: 3.0 standard drinks  ?  Types: 3 Glasses of wine per week  ? Drug use: No  ? Sexual activity: Not on file  ?Other Topics Concern  ? Not on file  ?Social History Narrative  ? Not on file  ? ?Social Determinants of Health   ? ?Financial Resource Strain: Not on file  ?Food Insecurity: Not on file  ?Transportation Needs: Not on file  ?Physical Activity: Not on file  ?Stress: Not on file  ?Social Connections: Not on file  ?Intimate Partner Violence: Not on file  ?  ?Family History  ?Problem Relation Age of Onset  ? Hypertension Father   ? Coronary artery disease Father   ? Alcohol abuse Father   ? Heart failure Brother   ? ?Allergies  ?Allergen Reactions  ? Penicillins Other (See Comments)  ?  Happened at 66 years old and pt. stated he passed out  ?He has tolerated amoxicillin/clavulanate and ampicillin/sulbactam  ? Statins   ?  Other reaction(s): Muscle Pain ?Causes legs to ache per pt  ? ?I? ?Current Facility-Administered Medications  ?  Medication Dose Route Frequency Provider Last Rate Last Admin  ? 0.9 %  sodium chloride infusion   Intravenous Continuous Caroline More, DPM 500 mL/hr at 08/07/21 1751 Rate Change at 08/07/21 1751  ? acetaminophen (TYLENOL) tablet 650 mg  650 mg Oral Q6H PRN Caroline More, DPM   650 mg at 08/05/21 0140  ? Or  ? acetaminophen (TYLENOL) suppository 650 mg  650 mg Rectal Q6H PRN Caroline More, DPM      ? albuterol (PROVENTIL) (2.5 MG/3ML) 0.083% nebulizer solution 2.5 mg  2.5 mg Nebulization Q6H PRN Caroline More, DPM      ? amiodarone (PACERONE) tablet 200 mg  200 mg Oral QAC breakfast Caroline More, DPM   200 mg at 08/08/21 1751  ? apixaban (ELIQUIS) tablet 5 mg  5 mg Oral BID Sharlotte Alamo, DPM      ? ascorbic acid (VITAMIN C) tablet 500 mg  500 mg Oral Daily Caroline More, DPM   500 mg at 08/08/21 1030  ? aspirin EC tablet 81 mg  81 mg Oral Daily Caroline More, DPM   81 mg at 08/08/21 1031  ? diltiazem (CARDIZEM CD) 24 hr capsule 300 mg  300 mg Oral QAC breakfast Caroline More, DPM   300 mg at 08/08/21 0258  ? ezetimibe (ZETIA) tablet 10 mg  10 mg Oral QHS Caroline More, DPM   10 mg at 08/07/21 2138  ? ferrous sulfate tablet 325 mg  325 mg Oral Q breakfast Caroline More, DPM   325 mg at 08/08/21 5277  ?  furosemide (LASIX) tablet 40 mg  40 mg Oral Daily Caroline More, DPM   40 mg at 08/08/21 1030  ? gabapentin (NEURONTIN) capsule 300 mg  300 mg Oral QHS Caroline More, DPM   300 mg at 08/07/21 2138  ? insulin as

## 2021-08-09 DIAGNOSIS — M86162 Other acute osteomyelitis, left tibia and fibula: Secondary | ICD-10-CM | POA: Diagnosis not present

## 2021-08-09 DIAGNOSIS — L03116 Cellulitis of left lower limb: Secondary | ICD-10-CM | POA: Diagnosis not present

## 2021-08-09 LAB — CULTURE, BLOOD (ROUTINE X 2)
Culture: NO GROWTH
Culture: NO GROWTH
Special Requests: ADEQUATE
Special Requests: ADEQUATE

## 2021-08-09 LAB — GLUCOSE, CAPILLARY
Glucose-Capillary: 146 mg/dL — ABNORMAL HIGH (ref 70–99)
Glucose-Capillary: 258 mg/dL — ABNORMAL HIGH (ref 70–99)
Glucose-Capillary: 262 mg/dL — ABNORMAL HIGH (ref 70–99)
Glucose-Capillary: 233 mg/dL — ABNORMAL HIGH (ref 70–99)

## 2021-08-09 LAB — SURGICAL PATHOLOGY

## 2021-08-09 MED ORDER — PIPERACILLIN-TAZOBACTAM 3.375 G IVPB
3.3750 g | Freq: Three times a day (TID) | INTRAVENOUS | Status: DC
  Administered 2021-08-09 – 2021-08-11 (×6): 3.375 g via INTRAVENOUS
  Filled 2021-08-09 (×6): qty 50

## 2021-08-09 NOTE — Progress Notes (Signed)
[place] at [facility] ? ? ?PATIENT NAME: [name]   ? ?MR#:  [id number] ? ?DATE OF BIRTH:  [birth year] ? ?SUBJECTIVE:  ? ? ?sitting at the edge of the bed. Eating lunch. Frustrated with his chronic foot infection. Wife at bedside. No other complaints. Got PICC line placed. ? ?VITALS:  ?Blood pressure 133/90, pulse (!) 106, temperature 97.8 ?F (36.6 ?C), resp. rate 17, height 6' 4.5" (1.943 m), weight (!) 158.8 kg, SpO2 90 %. ? ?PHYSICAL EXAMINATION:  ? ?GENERAL:  66 y.o.-year-old patient lying in the bed with no acute distress. Obese ?LUNGS: Normal breath sounds bilaterally, no wheezing, rales, rhonchi.  ?CARDIOVASCULAR: S1, S2 normal. No murmurs, rubs, or gallops.  ?ABDOMEN: Soft, nontender, nondistended. Bowel sounds present.  ?EXTREMITIES:  ? ? ?NEUROLOGIC: nonfocal  patient is alert and awake ?SKIN: as above ? ?LABORATORY PANEL:  ?CBC ?Recent Labs  ?Lab 08/08/21 ?1639  ?WBC 7.4  ?HGB 10.2*  ?HCT 34.6*  ?PLT 209  ? ? ?Chemistries  ?Recent Labs  ?Lab 08/08/21 ?1639  ?NA 135  ?K 5.0  ?CL 101  ?CO2 26  ?GLUCOSE 249*  ?BUN 49*  ?CREATININE 1.20  ?CALCIUM 8.3*  ?AST 35  ?ALT 38  ?ALKPHOS 246*  ?BILITOT 0.7  ? ?Cardiac Enzymes ?No results for input(s): TROPONINI in the last 168 hours. ?RADIOLOGY:  ?DG Os Calcis Left ? ?Result Date: 08/07/2021 ?CLINICAL DATA:  Postop EXAM: LEFT FOOT - 2 VIEW; LEFT OS CALCIS - 2+ VIEW COMPARISON:  08/04/2021 FINDINGS: Status post interval plantar heel wound debridement and antibiotic bead packing. No fracture or dislocation. Small plantar and Achilles calcaneal spurs. Status post prior transmetatarsal amputation of the left third digit and distal left fifth metatarsal. IMPRESSION: Status post interval plantar heel wound debridement and antibiotic bead packing. No fracture or dislocation. Electronically Signed   By: Delanna Ahmadi M.D.   On: 08/07/2021 19:13  ? ?DG Chest Port 1 View ? ?Result Date: 08/08/2021 ?CLINICAL DATA:  PICC placement. EXAM:  PORTABLE CHEST 1 VIEW COMPARISON:  Chest radiograph 05/08/2021 FINDINGS: Right upper extremity PICC tip in the lower SVC. No pneumothorax. Patient is post median sternotomy. Similar cardiomegaly. Vascular congestion with suggestion of mild pulmonary edema. No pleural effusion or confluent consolidation. IMPRESSION: 1. Tip of the right upper extremity PICC in the lower SVC. 2. Cardiomegaly with vascular congestion and mild pulmonary edema. Electronically Signed   By: Keith Rake M.D.   On: 08/08/2021 19:24  ? ?DG Foot 2 Views Left ? ?Result Date: 08/07/2021 ?CLINICAL DATA:  Postop EXAM: LEFT FOOT - 2 VIEW; LEFT OS CALCIS - 2+ VIEW COMPARISON:  08/04/2021 FINDINGS: Status post interval plantar heel wound debridement and antibiotic bead packing. No fracture or dislocation. Small plantar and Achilles calcaneal spurs. Status post prior transmetatarsal amputation of the left third digit and distal left fifth metatarsal. IMPRESSION: Status post interval plantar heel wound debridement and antibiotic bead packing. No fracture or dislocation. Electronically Signed   By: Delanna Ahmadi M.D.   On: 08/07/2021 19:13  ? ?[place] EKG SITE RITE ? ?Result Date: 08/08/2021 ?If [employer] not attached, placement could not be confirmed due to current cardiac rhythm.  ? ?Assessment and Plan ?66 year old male with past medical history of poorly controlled diabetes on insulin, hypertension, morbid obesity, COPD, atrial fibrillation on anticoagulation, CAD status post CABG obstructive sleep apnea and PAD with a chronic left foot ulcer who presented to the emergency room after being sent over from the wound  clinic for worsening cellulitis of his left foot that was not responding to outpatient antibiotics.  Patient admitted to the hospitalist service and podiatry consulted.  An MRI of the left ankle was done noting early osteomyelitis.  Given concerns for BKA, podiatry took patient on 4/10 for wound debridement and partial calcanectomy.   Postop, they are recommending 6 weeks of IV antibiotics.  Infectious disease consulted.   ? ? ?Acute osteomyelitis of lower leg, left (HCC) ?--High risk for BKA in the future.  ?-- Patient is status post debridement and partial calcanectomy 4/10.   ?--Infectious disease consulted.  Plan will be for IV antibiotics for the next 6 weeks.  ?-- PICC line placed 07/1221 ?  ?Cellulitis of left foot ?--Chronic nonhealing wound and now with concerns for osteomyelitis. ?--  Initially on vancomycin and cefepime. --now on IV zosyn ?--Continue wound care.  ?--Cont  Wound VAC  ?--  Patient requesting increase and Ultram for pain ?  ?Insulin dependent type 2 diabetes mellitus ([place]) ?--Poorly controlled.  A1c of 10.2.   ?-Appreciate diabetic coordinator's help.  Postsurgery ?-- Lantus decreased to 35 units twice a day  ?-- sliding scale and adding NovoLog 4 units 3 times daily with meals. ?- Diabetic/carb modified/heart healthy diet ordered ?  ?Hypersomnia with sleep apnea ?--Continue nightly CPAP ?  ?Atrial fibrillation, chronic ([place]) ?--Continued on Cardizem and amiodarone plus metoprolol. ?-- resumed Eliquis now post op ?--  Currently rate controlled and appears to be in sinus at times. ?  ?Essential hypertension, benign ?Blood pressure stable during this hospitalization.  ?-- Continue ARB and beta-blocker. ?  ?CAD (coronary artery disease) ?-- during this hospitalization.   ?--Continue aspirin and statin plus antihypertensives. ?  ? Pure hypercholesterolemia ?--Continue Zetia ?  ?Venous stasis of both lower extremities ?--Continue diuretic and Ace wrap. ?  ? BPH (benign prostatic hyperplasia) ?--Continue Flomax ?  ?Obesity, Class III, BMI 40-49.9 (morbid obesity) ([place]) ? Patient meets criteria BMI greater than 40. ? ? ? ?Procedures:status post debridement and partial calcanectomy 4/10.  ?Family communication :wife in the room ?Consults : podiatry, ID ?CODE STATUS: full ?DVT Prophylaxis : eliquis ?Level of care: Med-Surg ?Status  is: Inpatient ?Remains inpatient appropriate because: patient medical improving will discharged to rehab once bed available. ?  ? ?TOTAL TIME TAKING CARE OF THIS PATIENT: 25 minutes.  ?>50% time spent on counselling and coordination of care ? ?Note: This dictation was prepared with Dragon dictation along with smaller phrase technology. Any transcriptional errors that result from this process are unintentional. ? ?Fritzi Mandes M.D  ? ? ?[facility]  ? ?CC: ?Primary care physician; [name], MD  ?

## 2021-08-09 NOTE — Consult Note (Signed)
?[name] ?Vascular Consult Note ? ?MRN : [id number] ? ?[name]. is a 66 y.o. ([birth year]) male who presents with chief complaint of  ?Chief Complaint  ?Patient presents with  ? Wound Infection  ?. ? ? ?Consulting Physician: Dr. Caroline More ?Reason for consult: Nonhealing foot ulceration ?History of Present Illness: [name] is a 66 year old male well-known to our practice that presents to [facility] after development of left foot cellulitis and worsening of his wound and nonresponsiveness to antibiotics.  There was concern that the patient may have developed osteomyelitis.  MRI of the left ankle was done and noted early osteomyelitis.  On 08/07/2021 the patient was taken for wound debridement and partial calcanectomy.  The patient had a wound VAC placed after intervention.  He is tolerating this well thus far. ? ?Current Facility-Administered Medications  ?Medication Dose Route Frequency Provider Last Rate Last Admin  ? 0.9 %  sodium chloride infusion   Intravenous Continuous Caroline More, DPM 500 mL/hr at 08/07/21 1751 Rate Change at 08/07/21 1751  ? acetaminophen (TYLENOL) tablet 650 mg  650 mg Oral Q6H PRN Caroline More, DPM   650 mg at 08/05/21 0140  ? Or  ? acetaminophen (TYLENOL) suppository 650 mg  650 mg Rectal Q6H PRN Caroline More, DPM      ? albuterol (PROVENTIL) (2.5 MG/3ML) 0.083% nebulizer solution 2.5 mg  2.5 mg Nebulization Q6H PRN Caroline More, DPM      ? amiodarone (PACERONE) tablet 200 mg  200 mg Oral QAC breakfast Caroline More, DPM   200 mg at 08/09/21 0944  ? apixaban (ELIQUIS) tablet 5 mg  5 mg Oral BID Sharlotte Alamo, DPM   5 mg at 08/09/21 0945  ? ascorbic acid (VITAMIN C) tablet 500 mg  500 mg Oral Daily Caroline More, DPM   500 mg at 08/09/21 0944  ? aspirin EC tablet 81 mg  81 mg Oral Daily Caroline More, DPM   81 mg at 08/09/21 0944  ? Chlorhexidine Gluconate Cloth 2 % PADS 6 each  6 each Topical Daily Annita Brod, MD   6 each at 08/09/21 [phone]  ? diltiazem (CARDIZEM CD) 24 hr capsule 300 mg  300 mg Oral QAC breakfast Caroline More, DPM   300 mg at 08/09/21 0941  ? ezetimibe (ZETIA) tablet 10 mg  10 mg Oral QHS Caroline More, DPM   10 mg at 08/08/21 2100  ? ferrous sulfate tablet 325 mg  325 mg Oral Q breakfast Caroline More, DPM   325 mg at 08/09/21 0944  ? furosemide (LASIX) tablet 40 mg  40 mg Oral Daily Caroline More, DPM   40 mg at 08/09/21 0944  ? gabapentin (NEURONTIN) capsule 300 mg  300 mg Oral QHS Caroline More, DPM   300 mg at 08/08/21 2100  ? insulin aspart (novoLOG) injection 0-20 Units  0-20 Units Subcutaneous TID WC Annita Brod, MD   11 Units at 08/09/21 1452  ? insulin aspart (novoLOG) injection 0-5 Units  0-5 Units Subcutaneous QHS Annita Brod, MD   2 Units at 08/08/21 2101  ? insulin aspart (novoLOG) injection 4 Units  4 Units Subcutaneous TID WC Annita Brod, MD   4 Units at 08/09/21 1452  ? insulin glargine-yfgn (SEMGLEE) injection 35 Units  35 Units Subcutaneous BID Annita Brod, MD   35 Units at 08/09/21 1117  ? isosorbide mononitrate (IMDUR) 24 hr tablet 60 mg  60 mg Oral BID  Caroline More, DPM   60 mg at 08/09/21 0944  ? losartan (COZAAR) tablet 100 mg  100 mg Oral Daily Caroline More, DPM   100 mg at 08/09/21 1610  ? metFORMIN (GLUCOPHAGE) tablet 1,000 mg  1,000 mg Oral BID WC Caroline More, DPM   1,000 mg at 08/09/21 9604  ? metoprolol tartrate (LOPRESSOR) injection 5 mg  5 mg Intravenous Q8H PRN Caroline More, DPM      ? metoprolol tartrate (LOPRESSOR) tablet 75 mg  75 mg Oral BID Caroline More, DPM   75 mg at 08/09/21 0943  ? morphine (PF) 2 MG/ML injection 2 mg  2 mg Intravenous Q4H PRN Annita Brod, MD   2 mg at 08/09/21 1453  ? multivitamin with minerals tablet 1 tablet  1 tablet Oral Daily Caroline More, DPM   1 tablet at 08/09/21 5409  ? nitroGLYCERIN (NITROSTAT) SL tablet 0.4 mg  0.4 mg Sublingual Q5 min PRN Caroline More, DPM      ? ondansetron [facility])  tablet 4 mg  4 mg Oral Q6H PRN Caroline More, DPM      ? Or  ? ondansetron (ZOFRAN) injection 4 mg  4 mg Intravenous Q6H PRN Caroline More, DPM      ? piperacillin-tazobactam (ZOSYN) IVPB 3.375 g  3.375 g Intravenous Q8H Tsosie Billing, MD 12.5 mL/hr at 08/09/21 1605 3.375 g at 08/09/21 1605  ? polyethylene glycol (MIRALAX / GLYCOLAX) packet 17 g  17 g Oral Daily PRN Caroline More, DPM      ? pramipexole (MIRAPEX) tablet 2 mg  2 mg Oral QHS Annita Brod, MD   2 mg at 08/08/21 2101  ? primidone (MYSOLINE) tablet 250 mg  250 mg Oral BID Annita Brod, MD   250 mg at 08/09/21 8119  ? sodium chloride flush (NS) 0.9 % injection 10-40 mL  10-40 mL Intracatheter Q12H Annita Brod, MD      ? sodium chloride flush (NS) 0.9 % injection 10-40 mL  10-40 mL Intracatheter PRN Annita Brod, MD      ? sodium hypochlorite (DAKIN'S 1/2 STRENGTH) topical solution   Irrigation Daily PRN Caroline More, DPM      ? tamsulosin (FLOMAX) capsule 0.4 mg  0.4 mg Oral Daily Caroline More, DPM   0.4 mg at 08/09/21 0944  ? traMADol (ULTRAM) tablet 100 mg  100 mg Oral Q6H PRN Annita Brod, MD      ? vitamin B-12 (CYANOCOBALAMIN) tablet 10,000 mcg  10,000 mcg Oral Daily Caroline More, DPM   10,000 mcg at 08/09/21 1478  ? zinc sulfate capsule 220 mg  220 mg Oral Daily Caroline More, DPM   220 mg at 08/09/21 0944  ? ? ?Past Medical History:  ?Diagnosis Date  ? Arrhythmia   ? atrial fibrillation  ? Asthma   ? CHF (congestive heart failure) ([name])   ? COPD (chronic obstructive pulmonary disease) ([name])   ? Coronary artery disease   ? Depression   ? Diabetes mellitus without complication ([name])   ? Gout   ? History anabolic steroid use   ? Hyperlipidemia   ? Hypertension   ? Hypogonadism in male   ? Morbid obesity ([name])   ? Myocardial infarction [facility])   ? Peripheral vascular disease ([name])   ? Perirectal abscess   ? Pleurisy   ? Sleep apnea   ? CPAP at night, no oxygen  ? Varicella   ? ? ?Past Surgical History:   ?Procedure Laterality  Date  ? ABDOMINAL AORTIC ANEURYSM REPAIR    ? ACHILLES TENDON SURGERY Left 01/10/2021  ? Procedure: ACHILLES LENGTHENING/KIDNER;  Surgeon: Caroline More, DPM;  Location: [facility] ORS;  Service: Podiatry;  Laterality: Left;  ? AMPUTATION TOE Right 02/10/2016  ? Procedure: AMPUTATION TOE 3RD TOE;  Surgeon: Samara Deist, DPM;  Location: [facility] ORS;  Service: Podiatry;  Laterality: Right;  ? AMPUTATION TOE Left 02/24/2020  ? Procedure: AMPUTATION TOE;  Surgeon: Caroline More, DPM;  Location: [facility] OR[facility];  Service: Podiatry;  Laterality: Left;  ? APPLICATION OF WOUND VAC Left 02/29/2020  ? Procedure: APPLICATION OF WOUND VAC;  Surgeon: Caroline More, DPM;  Location: [facility] OR[facility];  Service: Podiatry;  Laterality: Left;  ? COLONOSCOPY WITH PROPOFOL [name] 11/18/2015  ? Procedure: COLONOSCOPY WITH PROPOFOL;  Surgeon: Manya Silvas, MD;  Location: [facility] ENDOSCOPY;  Service: Endoscopy;  Laterality: [name];  ? CORONARY ARTERY BYPASS GRAFT    ? CORONARY STENT INTERVENTION [name] 02/02/2020  ? Procedure: CORONARY STENT INTERVENTION;  Surgeon: Isaias Cowman, MD;  Location: [place] CV LAB;  Service: Cardiovascular;  Laterality: [name];  ? INCISION AND DRAINAGE Left 08/07/2021  ? Procedure: INCISION AND DRAINAGE-Partial Calcanectomy;  Surgeon: Caroline More, DPM;  Location: [facility] ORS;  Service: Podiatry;  Laterality: Left;  ? IRRIGATION AND DEBRIDEMENT FOOT Left 02/29/2020  ? Procedure: IRRIGATION AND DEBRIDEMENT FOOT;  Surgeon: Caroline More, DPM;  Location: [facility] ORS;  Service: Podiatry;  Laterality: Left;  ? IRRIGATION AND DEBRIDEMENT FOOT Left 02/24/2020  ? Procedure: IRRIGATION AND DEBRIDEMENT FOOT;  Surgeon: Caroline More, DPM;  Location: [facility] ORS;  Service: Podiatry;  Laterality: Left;  ? KNEE ARTHROSCOPY    ? LEFT HEART CATH AND CORS/GRAFTS ANGIOGRAPHY [name] 02/02/2020  ? Procedure: LEFT HEART CATH AND CORS/GRAFTS ANGIOGRAPHY;  Surgeon: Teodoro Spray, MD;  Location: [place] [facility];  Service: Cardiovascular;   Laterality: [name];  ? LOWER EXTREMITY ANGIOGRAPHY Left 02/25/2020  ? Procedure: Lower Extremity Angiography;  Surgeon: Algernon Huxley, MD;  Location: [place] [facility];  Service: Cardiovascular;  Laterality: Left;  ? L

## 2021-08-09 NOTE — Progress Notes (Addendum)
? ?Date of Admission:  08/04/2021    ? ?ID: Alec Less. is a 66 y.o. male  ?Principal Problem: ?  Acute osteomyelitis of lower leg, left ([place]) ?Active Problems: ?  Essential hypertension, benign ?  CAD (coronary artery disease) ?  Hypersomnia with sleep apnea ?  Pure hypercholesterolemia ?  Obesity, Class III, BMI 40-49.9 (morbid obesity) ([place]) ?  BPH (benign prostatic hyperplasia) ?  Venous stasis of both lower extremities ?  Cellulitis of left foot ?  Insulin dependent type 2 diabetes mellitus ([place]) ?  Atrial fibrillation, chronic ([place]) ? ? ? ?Subjective: ?Pt doing better ?Pain much better ?Eating well ?No diarrhea ?No cough or SOB ? ?Medications:  ? amiodarone  200 mg Oral QAC breakfast  ? apixaban  5 mg Oral BID  ? vitamin C  500 mg Oral Daily  ? aspirin EC  81 mg Oral Daily  ? Chlorhexidine Gluconate Cloth  6 each Topical Daily  ? diltiazem  300 mg Oral QAC breakfast  ? ezetimibe  10 mg Oral QHS  ? ferrous sulfate  325 mg Oral Q breakfast  ? furosemide  40 mg Oral Daily  ? gabapentin  300 mg Oral QHS  ? insulin aspart  0-20 Units Subcutaneous TID WC  ? insulin aspart  0-5 Units Subcutaneous QHS  ? insulin aspart  4 Units Subcutaneous TID WC  ? insulin glargine-yfgn  35 Units Subcutaneous BID  ? isosorbide mononitrate  60 mg Oral BID  ? losartan  100 mg Oral Daily  ? metFORMIN  1,000 mg Oral BID WC  ? metoprolol tartrate  75 mg Oral BID  ? multivitamin with minerals  1 tablet Oral Daily  ? pramipexole  2 mg Oral QHS  ? primidone  250 mg Oral BID  ? sodium chloride flush  10-40 mL Intracatheter Q12H  ? tamsulosin  0.4 mg Oral Daily  ? vitamin B-12  10,000 mcg Oral Daily  ? zinc sulfate  220 mg Oral Daily  ? ? ?Objective: ?Vital signs in last 24 hours: ?Temp:  [97.1 ?F (36.2 ?C)-97.8 ?F (36.6 ?C)] 97.8 ?F (36.6 ?C) (04/12 1443) ?Pulse Rate:  [106-109] 106 (04/12 0804) ?Resp:  [17-20] 17 (04/12 0804) ?BP: (113-133)/(73-92) 133/90 (04/12 0804) ?SpO2:  [89 %-97 %] 90 % (04/12 0804) ? ?PHYSICAL EXAM:   ?General: Alert, cooperative, no distress, appears stated age.  ?Head: Normocephalic, without obvious abnormality, atraumatic. ?Eyes: Conjunctivae clear, anicteric sclerae. Pupils are equal ?ENT Nares normal. No drainage or sinus tenderness. ?Lips, mucosa, and tongue normal. No Thrush ?Neck: Supple, symmetrical, no adenopathy, thyroid: non tender ?no carotid bruit and no JVD. ?Back: No CVA tenderness. ?Lungs: Clear to auscultation bilaterally. No Wheezing or Rhonchi. No rales. ?Heart: irregular- well controlled ?Abdomen: Soft, non-tender,not distended. Bowel sounds normal. No masses ?Extremities: rt leg venous edema, stasis, eczematous changes ?Left heel ulcer covered with wound vac ?Skin: No rashes or lesions. Or bruising ?Lymph: Cervical, supraclavicular normal. ?Neurologic: Grossly non-focal ? ?Lab Results ?Recent Labs  ?  08/08/21 ?1639  ?WBC 7.4  ?HGB 10.2*  ?HCT 34.6*  ?NA 135  ?K 5.0  ?CL 101  ?CO2 26  ?BUN 49*  ?CREATININE 1.20  ? ?Liver Panel ?Recent Labs  ?  08/08/21 ?1639  ?PROT 7.0  ?ALBUMIN 2.6*  ?AST 35  ?ALT 38  ?ALKPHOS 246*  ?BILITOT 0.7  ? ?Sedimentation Rate ?No results for input(s): ESRSEDRATE in the last 72 hours. ?C-Reactive Protein ?No results for input(s): CRP in the last 72  hours. ? ?Microbiology: ? ?Studies/Results: ?DG Os Calcis Left ? ?Result Date: 08/07/2021 ?CLINICAL DATA:  Postop EXAM: LEFT FOOT - 2 VIEW; LEFT OS CALCIS - 2+ VIEW COMPARISON:  08/04/2021 FINDINGS: Status post interval plantar heel wound debridement and antibiotic bead packing. No fracture or dislocation. Small plantar and Achilles calcaneal spurs. Status post prior transmetatarsal amputation of the left third digit and distal left fifth metatarsal. IMPRESSION: Status post interval plantar heel wound debridement and antibiotic bead packing. No fracture or dislocation. Electronically Signed   By: Delanna Ahmadi M.D.   On: 08/07/2021 19:13  ? ?DG Chest Port 1 View ? ?Result Date: 08/08/2021 ?CLINICAL DATA:  PICC placement.  EXAM: PORTABLE CHEST 1 VIEW COMPARISON:  Chest radiograph 05/08/2021 FINDINGS: Right upper extremity PICC tip in the lower SVC. No pneumothorax. Patient is post median sternotomy. Similar cardiomegaly. Vascular congestion with suggestion of mild pulmonary edema. No pleural effusion or confluent consolidation. IMPRESSION: 1. Tip of the right upper extremity PICC in the lower SVC. 2. Cardiomegaly with vascular congestion and mild pulmonary edema. Electronically Signed   By: Keith Rake M.D.   On: 08/08/2021 19:24  ? ?DG Foot 2 Views Left ? ?Result Date: 08/07/2021 ?CLINICAL DATA:  Postop EXAM: LEFT FOOT - 2 VIEW; LEFT OS CALCIS - 2+ VIEW COMPARISON:  08/04/2021 FINDINGS: Status post interval plantar heel wound debridement and antibiotic bead packing. No fracture or dislocation. Small plantar and Achilles calcaneal spurs. Status post prior transmetatarsal amputation of the left third digit and distal left fifth metatarsal. IMPRESSION: Status post interval plantar heel wound debridement and antibiotic bead packing. No fracture or dislocation. Electronically Signed   By: Delanna Ahmadi M.D.   On: 08/07/2021 19:13  ? ?[place] EKG SITE RITE ? ?Result Date: 08/08/2021 ?If [employer] not attached, placement could not be confirmed due to current cardiac rhythm.  ? ? ?Assessment/Plan: ? ?Left heel ulcer with cellulitis of the foot.  Patient underwent debridement yesterday. ?Pathology no osteomyelitis ?Culture multiple organisms ?Will change unasyn to zosyn as kleb oxytoca along with proteus, anerobes. Will give for 4-6 weeks ? ?Antibiotic alone is not adequate for his wound healing.  He needs to offload that foot.  And also have good diabetes control for wound healing.  Weight reduction is also an important component of wound healing ?Diabetes mellitus with peripheral neuropathy causing multiple infections of the feet and toes ?On insulin ? ?A-fib on diltiazem amiodarone metoprolol and apixaban ? ?Hypercholesterolemia: On  ezetimibe ? ? ?Hypertension on losartan and metoprolol ?BPH on tamsulosin ? ? ?Discussed the management with patient and his wife and care team ? ? ?OPAT Orders ?Diagnosis - Diabetic foot infection with left heel ulcer ?Discharge antibiotics: ?Zosyn 3.375grams Iv every 6 hours for 6 weeks  ?End Date: ?09/17/21 ? ?[facility] Care Per Protocol: ? ?Labs weekly while on IV antibiotics: ?_X_ CBC with differential ?__ BMP ?_X_ CMP ?_X_ CRP ?_X_ ESR ? ?_X_ Please pull PIC at completion of IV antibiotics ? ? ?Fax weekly labs to [phone] ? ?Clinic Follow Up Appt: 08/29/21 at 10.30 AM ? ? ?Call [phone] with any questions ? ?  ?  ?

## 2021-08-09 NOTE — TOC Progression Note (Signed)
Transition of Care (TOC) - Progression Note  ? ? ?Patient Details  ?Name: [name]. ?MRN: [id number] ?Date of Birth: 03-16-1956 ? ?Transition of Care (TOC) CM/SW Contact  ?Kerin Salen, RN ?Phone Number: ?08/09/2021, 1:32 PM ? ?Clinical Narrative: Spoke with wife, who agrees with SNF discharge. Bed search started.  ? ? ? ?  ?  ? ?Expected Discharge Plan and Services ?  ?  ?  ?  ?  ?                ?  ?  ?  ?  ?  ?  ?  ?  ?  ?  ? ? ?Social Determinants of Health (SDOH) Interventions ?  ? ?Readmission Risk Interventions ?   ? View : No data to display.  ?  ?  ?  ? ? ?

## 2021-08-09 NOTE — [place] FL2 (Signed)
?[place] MEDICAID FL2 LEVEL OF CARE SCREENING TOOL  ?  ? ?IDENTIFICATION  ?Patient Name: ?[name]. Birthdate: Feb 10, 1956 Sex: male Admission Date (Current Location): ?08/04/2021  ?[place] and [place] Number: ? [place] ?  Facility and Address:  ?[facility], [address] [place], [place] [address] ?     Provider Number: ?[id number]  ?Attending Physician Name and Address:  ?Fritzi Mandes, MD ? Relative Name and Phone Number:  ?LASSITER,[name] (Spouse)   [phone] ?   ?Current Level of Care: ?Hospital Recommended Level of Care: ?[place] Prior Approval Number: ?  ? ?Date Approved/Denied: ?  PASRR Number: ?[id number] A ? ?Discharge Plan: ?SNF ?  ? ?Current Diagnoses: ?Patient Active Problem List  ? Diagnosis Date Noted  ? Cellulitis of left foot 08/04/2021  ? Insulin dependent type 2 diabetes mellitus ([place]) 08/04/2021  ? Bilateral lower extremity edema 08/04/2021  ? Acute osteomyelitis of lower leg, left ([place]) 08/04/2021  ? Atrial fibrillation, chronic ([place]) 08/04/2021  ? Swelling of limb 07/18/2021  ? CHF exacerbation ([place]) 05/08/2021  ? Diabetes mellitus without complication ([place])   ? Atrial fibrillation with RVR ([place])   ? Obesity, Class III, BMI 40-49.9 (morbid obesity) ([place])   ? Venous stasis ulcer ([place])   ? Sinus tachycardia   ? BPH (benign prostatic hyperplasia)   ? Chronic anticoagulation   ? Anasarca   ? Acute on chronic diastolic CHF (congestive heart failure) ([place])   ? Foot ulcer with fat layer exposed, left ([place])   ? Venous stasis of both lower extremities   ? Sepsis due to cellulitis ([place]) 01/03/2021  ? Chronic osteomyelitis of left foot ([place]) 01/03/2021  ? Osteomyelitis ([place]) 01/03/2021  ? Severe sepsis ([place]) 02/24/2020  ? AKI (acute kidney injury) ([place]) 02/24/2020  ? Bilateral cellulitis of lower leg 02/23/2020  ? CAD (coronary artery disease) 02/02/2020  ? RLS (restless legs syndrome) 05/15/2019  ? Statin myopathy 05/15/2019  ? Diabetes mellitus  type 2 in obese ([place]) 02/13/2016  ? Essential hypertension, benign 02/13/2016  ? PAD (peripheral artery disease) ([place]) 02/13/2016  ? Pressure injury of skin 01/25/2016  ? Hypoglycemia associated with diabetes ([place]) 12/27/2013  ? Long-term insulin use ([place]) 12/27/2013  ? Microalbuminuria 12/27/2013  ? B12 deficiency 09/29/2013  ? Obesity 09/29/2013  ? CAD (coronary artery disease), autologous vein bypass graft 07/20/2013  ? Hypersomnia with sleep apnea 07/20/2013  ? Pure hypercholesterolemia 07/20/2013  ? Osteomyelitis of ankle or foot 07/20/2011  ? ? ?Orientation RESPIRATION BLADDER Height & Weight   ?  ?Self, Time, Situation, Place ? Normal Continent Weight: (!) 158.8 kg ?Height:  6' 4.5" (194.3 cm)  ?BEHAVIORAL SYMPTOMS/MOOD NEUROLOGICAL BOWEL NUTRITION STATUS  ?    Continent Diet  ?AMBULATORY STATUS COMMUNICATION OF NEEDS Skin   ?Extensive Assist Verbally Wound Vac, Other (Comment) (Bilateral LE wounds) ?  ?  ?  ?    ?     ?     ? ? ?Personal Care Assistance Level of Assistance  ?Bathing, Feeding, Dressing Bathing Assistance: Limited assistance ?Feeding assistance: Independent ?Dressing Assistance: Limited assistance ?   ? ?Functional Limitations Info  ?Sight, Hearing, Speech Sight Info: Adequate ?Hearing Info: Adequate ?Speech Info: Adequate  ? ? ?SPECIAL CARE FACTORS FREQUENCY  ?PT (By licensed PT), OT (By licensed OT)   ?  ?PT Frequency: 5X Week ?OT Frequency: 5X Week ?  ?  ?  ?   ? ? ?Contractures Contractures Info: Not present  ? ? ?Additional Factors Info  ?  Code Status, Allergies Code Status Info: Full ?Allergies Info: Penicillins, Statins ?  ?  ?  ?   ? ?Current Medications (08/09/2021):  This is the current hospital active medication list ?Current Facility-Administered Medications  ?Medication Dose Route Frequency Provider Last Rate Last Admin  ? 0.9 %  sodium chloride infusion   Intravenous Continuous Caroline More, DPM 500 mL/hr at 08/07/21 1751 Rate Change at 08/07/21 1751  ? acetaminophen (TYLENOL)  tablet 650 mg  650 mg Oral Q6H PRN Caroline More, DPM   650 mg at 08/05/21 0140  ? Or  ? acetaminophen (TYLENOL) suppository 650 mg  650 mg Rectal Q6H PRN Caroline More, DPM      ? albuterol (PROVENTIL) (2.5 MG/3ML) 0.083% nebulizer solution 2.5 mg  2.5 mg Nebulization Q6H PRN Caroline More, DPM      ? amiodarone (PACERONE) tablet 200 mg  200 mg Oral QAC breakfast Caroline More, DPM   200 mg at 08/09/21 0944  ? apixaban (ELIQUIS) tablet 5 mg  5 mg Oral BID Sharlotte Alamo, DPM   5 mg at 08/09/21 0945  ? ascorbic acid (VITAMIN C) tablet 500 mg  500 mg Oral Daily Caroline More, DPM   500 mg at 08/09/21 0944  ? aspirin EC tablet 81 mg  81 mg Oral Daily Caroline More, DPM   81 mg at 08/09/21 0944  ? Chlorhexidine Gluconate Cloth 2 % PADS 6 each  6 each Topical Daily Annita Brod, MD   6 each at 08/09/21 [phone]  ? diltiazem (CARDIZEM CD) 24 hr capsule 300 mg  300 mg Oral QAC breakfast Caroline More, DPM   300 mg at 08/09/21 0941  ? ezetimibe (ZETIA) tablet 10 mg  10 mg Oral QHS Caroline More, DPM   10 mg at 08/08/21 2100  ? ferrous sulfate tablet 325 mg  325 mg Oral Q breakfast Caroline More, DPM   325 mg at 08/09/21 0944  ? furosemide (LASIX) tablet 40 mg  40 mg Oral Daily Caroline More, DPM   40 mg at 08/09/21 0944  ? gabapentin (NEURONTIN) capsule 300 mg  300 mg Oral QHS Caroline More, DPM   300 mg at 08/08/21 2100  ? insulin aspart (novoLOG) injection 0-20 Units  0-20 Units Subcutaneous TID WC Annita Brod, MD   4 Units at 08/09/21 0940  ? insulin aspart (novoLOG) injection 0-5 Units  0-5 Units Subcutaneous QHS Annita Brod, MD   2 Units at 08/08/21 2101  ? insulin aspart (novoLOG) injection 4 Units  4 Units Subcutaneous TID WC Annita Brod, MD   4 Units at 08/09/21 [phone]  ? insulin glargine-yfgn (SEMGLEE) injection 35 Units  35 Units Subcutaneous BID Annita Brod, MD   35 Units at 08/09/21 1117  ? isosorbide mononitrate (IMDUR) 24 hr tablet 60 mg  60 mg Oral BID Caroline More, DPM   60 mg at  08/09/21 0944  ? losartan (COZAAR) tablet 100 mg  100 mg Oral Daily Caroline More, DPM   100 mg at 08/09/21 6644  ? metFORMIN (GLUCOPHAGE) tablet 1,000 mg  1,000 mg Oral BID WC Caroline More, DPM   1,000 mg at 08/09/21 0347  ? metoprolol tartrate (LOPRESSOR) injection 5 mg  5 mg Intravenous Q8H PRN Caroline More, DPM      ? metoprolol tartrate (LOPRESSOR) tablet 75 mg  75 mg Oral BID Caroline More, DPM   75 mg at 08/09/21 0943  ? morphine (PF) 2 MG/ML injection 2 mg  2 mg Intravenous  Q4H PRN Annita Brod, MD   2 mg at 08/09/21 4580  ? multivitamin with minerals tablet 1 tablet  1 tablet Oral Daily Caroline More, DPM   1 tablet at 08/09/21 9983  ? nitroGLYCERIN (NITROSTAT) SL tablet 0.4 mg  0.4 mg Sublingual Q5 min PRN Caroline More, DPM      ? ondansetron (ZOFRAN) tablet 4 mg  4 mg Oral Q6H PRN Caroline More, DPM      ? Or  ? ondansetron (ZOFRAN) injection 4 mg  4 mg Intravenous Q6H PRN Caroline More, DPM      ? piperacillin-tazobactam (ZOSYN) IVPB 3.375 g  3.375 g Intravenous Q8H Ravishankar, Joellyn Quails, MD      ? polyethylene glycol (MIRALAX / GLYCOLAX) packet 17 g  17 g Oral Daily PRN Caroline More, DPM      ? pramipexole (MIRAPEX) tablet 2 mg  2 mg Oral QHS Annita Brod, MD   2 mg at 08/08/21 2101  ? primidone (MYSOLINE) tablet 250 mg  250 mg Oral BID Annita Brod, MD   250 mg at 08/09/21 3825  ? sodium chloride flush (NS) 0.9 % injection 10-40 mL  10-40 mL Intracatheter Q12H Annita Brod, MD      ? sodium chloride flush (NS) 0.9 % injection 10-40 mL  10-40 mL Intracatheter PRN Annita Brod, MD      ? sodium hypochlorite (DAKIN'S 1/2 STRENGTH) topical solution   Irrigation Daily PRN Caroline More, DPM      ? tamsulosin (FLOMAX) capsule 0.4 mg  0.4 mg Oral Daily Caroline More, DPM   0.4 mg at 08/09/21 0944  ? traMADol (ULTRAM) tablet 100 mg  100 mg Oral Q6H PRN Annita Brod, MD      ? vitamin B-12 (CYANOCOBALAMIN) tablet 10,000 mcg  10,000 mcg Oral Daily Caroline More, DPM    10,000 mcg at 08/09/21 0539  ? zinc sulfate capsule 220 mg  220 mg Oral Daily Caroline More, DPM   220 mg at 08/09/21 7673  ? ? ? ?Discharge Medications: ?Please see discharge summary for a list of discharge

## 2021-08-09 NOTE — Progress Notes (Signed)
Occupational Therapy Treatment ?Patient Details ?Name: [name]. ?MRN: [id number] ?DOB: Apr 17, 1956 ?Today's Date: 08/09/2021 ? ? ?History of present illness Pt is a 66 y.o. male presenting to hospital 4/7 from wound clinic d/t concern of cellulitis and failing outpatient therapy.  Pt admitted with cellulitis, chronic a-fib, delayed wound healing (L heel).  PMH includes htn, IDDM, morbid obesity, depression, CAD s/p CABG 2006 and PCI to RCA in 12/2020, chronic B venous stasis on Unna boots, chronic L foot ulcer, COPD, OSA on CPAP nightly, ai-fib, PAD, s/p multiple toe amputations. ?  ?OT comments ? Pt seen for skilled co-treat with PT. Pt received seated on EOB and agreeable to OT intervention. Pt standing for very elevated bed of ~ 35 inches with min A of 2 and therapist placing L LE over foot in order to determine if pt is weight bearing. Pt was still found to be attempt to weight bear when coming into stand from elevated surface. Pt stands with RW and is unable to hop. Therefore knee scooter moved towards him and +2 assist to get pt safely onto scooter. Pt able to push self around RN station on scooter but does report increased fatigues and jokes that he used to be able to go to [employer] on it. Pt returning back to room and assisted to EOB. Therapist needing to physically elevated L LE when pt is repositioning on EOB to decrease weight bearing. Pt continues to need SNF to address functional deficits before returning home.   ? ?Recommendations for follow up therapy are one component of a multi-disciplinary discharge planning process, led by the attending physician.  Recommendations may be updated based on patient status, additional functional criteria and insurance authorization. ?   ?Follow Up Recommendations ? Skilled nursing-short term rehab (<3 hours/day)  ?  ?Assistance Recommended at Discharge Frequent or constant Supervision/Assistance  ?Patient can return home with the following ? A lot of help with  walking and/or transfers;A lot of help with bathing/dressing/bathroom;Assistance with cooking/housework;Help with stairs or ramp for entrance ?  ?Equipment Recommendations ? Other (comment) (defer to next venue of care)  ?  ?   ?Precautions / Restrictions Precautions ?Precautions: Fall ?Restrictions ?Weight Bearing Restrictions: Yes ?LLE Weight Bearing: Non weight bearing  ? ? ?  ? ?Mobility Bed Mobility ?Overal bed mobility: Modified Independent ?  ?  ?  ?  ?  ?  ?General bed mobility comments: Patient started and ended session sitting EOB ?  ? ?Transfers ?Overall transfer level: Needs assistance ?Equipment used: Rolling walker (2 wheels) ?Transfers: Sit to/from Stand ?Sit to Stand: From elevated surface, Min guard, +2 safety/equipment ?  ?  ?Step pivot transfers: Min guard, +2 safety/equipment ?  ?  ?General transfer comment: difficulty maintaining NWB precautions ?  ?  ?Balance Overall balance assessment: Needs assistance ?Sitting-balance support: No upper extremity supported, Feet supported ?Sitting balance-Leahy Scale: Normal ?Sitting balance - Comments: steady sitting reaching outside BOS ?  ?Standing balance support: Bilateral upper extremity supported, Reliant on assistive device for balance ?Standing balance-Leahy Scale: Poor ?Standing balance comment: increased difficulty maintaining WBing precautions ?  ?  ?  ?  ?  ?  ?  ?  ?  ?  ?  ?   ? ?ADL either performed or assessed with clinical judgement  ? ? ?Extremity/Trunk Assessment Upper Extremity Assessment ?Upper Extremity Assessment: Overall WFL for tasks assessed ?  ?  ?  ?  ?  ? ?Vision Patient Visual Report: No change from baseline ?  ?  ?   ?   ? ?  Cognition Arousal/Alertness: Awake/alert ?Behavior During Therapy: [facility] for tasks assessed/performed ?Overall Cognitive Status: Within Functional Limits for tasks assessed ?  ?  ?  ?  ?  ?  ?  ?  ?  ?  ?  ?  ?  ?  ?  ?  ?  ?  ?  ?   ?   ?   ?   ? ? ?Pertinent Vitals/ Pain       Pain Assessment ?Pain  Assessment: 0-10 ?Pain Score: 2  ?Faces Pain Scale: Hurts a little bit ?Pain Location: L heel ?Pain Descriptors / Indicators: Sore, Aching ?Pain Intervention(s): Monitored during session, Repositioned ? ?Home Living Family/patient expects to be discharged to:: Private residence ?Living Arrangements: Spouse/significant other ?Available Help at Discharge: Family;Available PRN/intermittently ?Type of Home: House ?Home Access: Stairs to enter ?  ?  ?  ?  ?  ?  ?  ?  ?  ?  ?  ?  ?  ?  ? ?  ?   ? ?Frequency ? Min 2X/week  ? ? ? ? ?  ?Progress Toward Goals ? ?OT Goals(current goals can now be found in the care plan section) ? Progress towards OT goals: Progressing toward goals ? ?Acute Rehab OT Goals ?Patient Stated Goal: to get stronger ?OT Goal Formulation: With patient ?Time For Goal Achievement: 08/22/21 ?Potential to Achieve Goals: Good  ?Plan Discharge plan remains appropriate   ? ?Co-evaluation ? ? ?   ?Reason for Co-Treatment: Complexity of the patient's impairments (multi-system involvement);For patient/therapist safety;To address functional/ADL transfers ?PT goals addressed during session: Mobility/safety with mobility ?OT goals addressed during session: ADL's and self-care ?  ? ?  ?AM-PAC OT "6 Clicks" Daily Activity     ?Outcome Measure ? ? Help from another person eating meals?: None ?Help from another person taking care of personal grooming?: A Little ?Help from another person toileting, which includes using toliet, bedpan, or urinal?: A Lot ?Help from another person bathing (including washing, rinsing, drying)?: A Lot ?Help from another person to put on and taking off regular upper body clothing?: A Little ?Help from another person to put on and taking off regular lower body clothing?: A Lot ?6 Click Score: 16 ? ?  ?End of Session Equipment Utilized During Treatment: Other (comment);Rolling walker (2 wheels) (knee scooter) ? ?OT Visit Diagnosis: Unsteadiness on feet (R26.81);Repeated falls (R29.6);Muscle  weakness (generalized) (M62.81) ?  ?Activity Tolerance Patient limited by pain ?  ?Patient Left in bed;with call bell/phone within reach ?  ?Nurse Communication Mobility status ?  ? ?   ? ?Time: 1103-1594 ?OT Time Calculation (min): 28 min ? ?Charges: OT General Charges ?$OT Visit: 1 Visit ?OT Treatments ?$Therapeutic Activity: 8-22 mins ? ?Darleen Crocker, Quinter, OTR/L , [place] ?ascom [phone]  ?08/09/21, 4:08 PM  ?

## 2021-08-09 NOTE — Progress Notes (Signed)
Physical Therapy Treatment ?Patient Details ?Name: [name]. ?MRN: [id number] ?DOB: 10-18-1955 ?Today's Date: 08/09/2021 ? ? ?History of Present Illness Pt is a 66 y.o. male presenting to hospital 4/7 from wound clinic d/t concern of cellulitis and failing outpatient therapy.  Pt admitted with cellulitis, chronic a-fib, delayed wound healing (L heel).  PMH includes htn, IDDM, morbid obesity, depression, CAD s/p CABG 2006 and PCI to RCA in 12/2020, chronic B venous stasis on Unna boots, chronic L foot ulcer, COPD, OSA on CPAP nightly, ai-fib, PAD, s/p multiple toe amputations. ? ?  ?PT Comments  ? ? PT/OT co-treat completed this date. Patient tolerated session well and was agreeable to treatment. Upon entry into room patient was sitting EOB on his phone. To complete sit to stand transfer patient required increased elevated surface with RW, and Min A +2 for safety and equipment. Patient continues to have increased difficulty maintaining NWB precautions in standing and with functional transfers. Once standing rolling scooter was placed between RW for patient to place LLE onto in order to maintain [place] precautions. Required Min A+2 for safety and equipment. Patient tolerated 1 lap around the nurses station with L scooter at [employer] for safety and equipment. Patient reported that he has 2+ steps to enter his home. At this point, patient unable to complete due to inability to maintain his weightbearing precautions on the LLE. Patient was left sitting EOB with family present and all needs met. Patient would continue to benefit from skilled physical therapy in order to optimize patient's safety, strength, and functional ability to perform ADLs. Continue to recommend STR upon discharge from acute hospitalization.  ?  ?Recommendations for follow up therapy are one component of a multi-disciplinary discharge planning process, led by the attending physician.  Recommendations may be updated based on patient status,  additional functional criteria and insurance authorization. ? ?Follow Up Recommendations ? Skilled nursing-short term rehab (<3 hours/day) ?  ?  ?Assistance Recommended at Discharge Frequent or constant Supervision/Assistance  ?Patient can return home with the following A little help with walking and/or transfers;A little help with bathing/dressing/bathroom;Assistance with cooking/housework;Assist for transportation;Help with stairs or ramp for entrance ?  ?Equipment Recommendations ? None recommended by PT  ?  ?Recommendations for Other Services   ? ? ?  ?Precautions / Restrictions Precautions ?Precautions: Fall ?Restrictions ?Weight Bearing Restrictions: Yes ?LLE Weight Bearing: Non weight bearing  ?  ? ?Mobility ? Bed Mobility ?  ?  ?  ?  ?  ?  ?  ?General bed mobility comments: Patient started and ended session sitting EOB ?  ? ?Transfers ?Overall transfer level: Needs assistance ?Equipment used: Rolling walker (2 wheels) ?Transfers: Sit to/from Stand ?Sit to Stand: From elevated surface, Min guard, +2 safety/equipment ?  ?  ?  ?  ?  ?General transfer comment: difficulty maintaining NWB preacutions with S2S and when transfers onto scooter ?  ? ?Ambulation/Gait ?Ambulation/Gait assistance: Min guard, +2 safety/equipment ?Gait Distance (Feet): 180 Feet ?  ?  ?Gait velocity: decreased ?  ?  ?General Gait Details: Patient utilized rolling scooter ~123f with CGA +2 for safety/equitment ? ? ?Stairs ?  ?  ?  ?  ?  ? ? ?Wheelchair Mobility ?  ? ?Modified Rankin (Stroke Patients Only) ?  ? ? ?  ?Balance Overall balance assessment: Needs assistance ?Sitting-balance support: No upper extremity supported, Feet supported ?Sitting balance-Leahy Scale: Normal ?Sitting balance - Comments: steady sitting reaching outside BOS ?  ?Standing balance support: Bilateral upper  extremity supported, Reliant on assistive device for balance ?Standing balance-Leahy Scale: Poor ?Standing balance comment: increased difficulty maintaining  WBing precautions ?  ?  ?  ?  ?  ?  ?  ?  ?  ?  ?  ?  ? ?  ?Cognition Arousal/Alertness: Awake/alert ?Behavior During Therapy: [facility] for tasks assessed/performed ?Overall Cognitive Status: Within Functional Limits for tasks assessed ?  ?  ?  ?  ?  ?  ?  ?  ?  ?  ?  ?  ?  ?  ?  ?  ?  ?  ?  ? ?  ?Exercises   ? ?  ?General Comments   ?  ?  ? ?Pertinent Vitals/Pain Pain Assessment ?Pain Assessment: 0-10 ?Pain Score: 2  ?Faces Pain Scale: Hurts a little bit ?Pain Location: L heel ?Pain Descriptors / Indicators: Sore, Aching ?Pain Intervention(s): Monitored during session, Repositioned  ? ? ?Home Living   ?  ?  ?  ?  ?  ?  ?  ?  ?  ?   ?  ?Prior Function    ?  ?  ?   ? ?PT Goals (current goals can now be found in the care plan section) Acute Rehab PT Goals ?Patient Stated Goal: to go home ?PT Goal Formulation: With patient ?Time For Goal Achievement: 08/19/21 ?Potential to Achieve Goals: Good ?Additional Goals ?Additional Goal #1: Pt with be able to navigate knee scooter safely >120 feet. ?Progress towards PT goals: Progressing toward goals ? ?  ?Frequency ? ? ? Min 2X/week ? ? ? ?  ?PT Plan Current plan remains appropriate  ? ? ?Co-evaluation PT/OT/SLP Co-Evaluation/Treatment: Yes ?Reason for Co-Treatment: Complexity of the patient's impairments (multi-system involvement);For patient/therapist safety;To address functional/ADL transfers ?PT goals addressed during session: Mobility/safety with mobility;Balance;Proper use of DME ?OT goals addressed during session: ADL's and self-care ?  ? ?  ?AM-PAC PT "6 Clicks" Mobility   ?Outcome Measure ? Help needed turning from your back to your side while in a flat bed without using bedrails?: A Little ?Help needed moving from lying on your back to sitting on the side of a flat bed without using bedrails?: A Little ?Help needed moving to and from a bed to a chair (including a wheelchair)?: A Lot ?Help needed standing up from a chair using your arms (e.g., wheelchair or bedside chair)?:  A Lot ?Help needed to walk in hospital room?: A Lot ?Help needed climbing 3-5 steps with a railing? : Total ?6 Click Score: 13 ? ?  ?End of Session Equipment Utilized During Treatment: Gait belt ?Activity Tolerance: Patient tolerated treatment well ?Patient left: in bed;with call bell/phone within reach;with family/visitor present;with bed alarm set ?Nurse Communication: Mobility status ?PT Visit Diagnosis: Other abnormalities of gait and mobility (R26.89);Muscle weakness (generalized) (M62.81);Pain ?Pain - Right/Left: Left ?Pain - part of body: Ankle and joints of foot ?  ? ? ?Time: 3532-9924 ?PT Time Calculation (min) (ACUTE ONLY): 28 min ? ?Charges:  $Therapeutic Activity: 8-22 mins          ?          ? ?Iva Boop, PT  ?08/09/21. 2:03 PM ? ? ?

## 2021-08-09 NOTE — Progress Notes (Signed)
PODIATRY / FOOT AND ANKLE SURGERY PROGRESS NOTE ? ?Requesting Physician: Dr. Reesa Chew ? ?Reason for consult: Left heel wound ? ?Chief Complaint: Left heel wound/infection ?  ?HPI: [name]. is a 66 y.o. male who presents today sitting up in bed at bedside comfortably.  Patient does not note any pain to his left heel today.  Patient has tolerated the wound VAC well and has not put any weight on his foot since the procedure.  Patient currently denies nausea, vomiting, fevers, chills. ? ?PMHx:  ?Past Medical History:  ?Diagnosis Date  ? Arrhythmia   ? atrial fibrillation  ? Asthma   ? CHF (congestive heart failure) ([place])   ? COPD (chronic obstructive pulmonary disease) ([place])   ? Coronary artery disease   ? Depression   ? Diabetes mellitus without complication ([place])   ? Gout   ? History anabolic steroid use   ? Hyperlipidemia   ? Hypertension   ? Hypogonadism in male   ? Morbid obesity ([place])   ? Myocardial infarction [facility])   ? Peripheral vascular disease ([place])   ? Perirectal abscess   ? Pleurisy   ? Sleep apnea   ? CPAP at night, no oxygen  ? Varicella   ? ? ?Surgical Hx:  ?Past Surgical History:  ?Procedure Laterality Date  ? ABDOMINAL AORTIC ANEURYSM REPAIR    ? ACHILLES TENDON SURGERY Left 01/10/2021  ? Procedure: ACHILLES LENGTHENING/KIDNER;  Surgeon: Caroline More, DPM;  Location: [facility] OR[facility];  Service: Podiatry;  Laterality: Left;  ? AMPUTATION TOE Right 02/10/2016  ? Procedure: AMPUTATION TOE 3RD TOE;  Surgeon: Samara Deist, DPM;  Location: [facility];  Service: Podiatry;  Laterality: Right;  ? AMPUTATION TOE Left 02/24/2020  ? Procedure: AMPUTATION TOE;  Surgeon: Caroline More, DPM;  Location: [facility];  Service: Podiatry;  Laterality: Left;  ? APPLICATION OF WOUND VAC Left 02/29/2020  ? Procedure: APPLICATION OF WOUND VAC;  Surgeon: Caroline More, DPM;  Location: [facility];  Service: Podiatry;  Laterality: Left;  ? COLONOSCOPY WITH PROPOFOL [name] 11/18/2015  ? Procedure: COLONOSCOPY WITH PROPOFOL;  Surgeon:  Manya Silvas, MD;  Location: [facility];  Service: Endoscopy;  Laterality: [name];  ? CORONARY ARTERY BYPASS GRAFT    ? CORONARY STENT INTERVENTION [name] 02/02/2020  ? Procedure: CORONARY STENT INTERVENTION;  Surgeon: Isaias Cowman, MD;  Location: [name] [facility];  Service: Cardiovascular;  Laterality: [name];  ? INCISION AND DRAINAGE Left 08/07/2021  ? Procedure: INCISION AND DRAINAGE-Partial Calcanectomy;  Surgeon: Caroline More, DPM;  Location: [facility];  Service: Podiatry;  Laterality: Left;  ? IRRIGATION AND DEBRIDEMENT FOOT Left 02/29/2020  ? Procedure: IRRIGATION AND DEBRIDEMENT FOOT;  Surgeon: Caroline More, DPM;  Location: [facility];  Service: Podiatry;  Laterality: Left;  ? IRRIGATION AND DEBRIDEMENT FOOT Left 02/24/2020  ? Procedure: IRRIGATION AND DEBRIDEMENT FOOT;  Surgeon: Caroline More, DPM;  Location: [facility];  Service: Podiatry;  Laterality: Left;  ? KNEE ARTHROSCOPY    ? LEFT HEART CATH AND CORS/GRAFTS ANGIOGRAPHY [name] 02/02/2020  ? Procedure: LEFT HEART CATH AND CORS/GRAFTS ANGIOGRAPHY;  Surgeon: Teodoro Spray, MD;  Location: [place] [facility];  Service: Cardiovascular;  Laterality: [name];  ? LOWER EXTREMITY ANGIOGRAPHY Left 02/25/2020  ? Procedure: Lower Extremity Angiography;  Surgeon: Algernon Huxley, MD;  Location: [place] [facility];  Service: Cardiovascular;  Laterality: Left;  ? LOWER EXTREMITY ANGIOGRAPHY Left 01/04/2021  ? Procedure: LOWER EXTREMITY ANGIOGRAPHY;  Surgeon: Algernon Huxley, MD;  Location: [place] [facility]  [facility];  Service: Cardiovascular;  Laterality: Left;  ? METATARSAL HEAD EXCISION Left 01/10/2021  ? Procedure: METATARSAL HEAD EXCISION - LEFT 5th;  Surgeon: Caroline More, DPM;  Location: [facility];  Service: Podiatry;  Laterality: Left;  ? PERIPHERAL VASCULAR CATHETERIZATION Right 01/24/2016  ? Procedure: Lower Extremity Angiography;  Surgeon: Katha Cabal, MD;  Location: [place] [facility];  Service: Cardiovascular;  Laterality: Right;  ? PERIPHERAL VASCULAR  CATHETERIZATION Right 01/25/2016  ? Procedure: Lower Extremity Angiography;  Surgeon: Katha Cabal, MD;  Location: [place] [facility];  Service: Cardiovascular;  Laterality: Right;  ? TOE AMPUTATION    ? TONSILLECTOMY    ? ? ?FHx:  ?Family History  ?Problem Relation Age of Onset  ? Hypertension Father   ? Coronary artery disease Father   ? Alcohol abuse Father   ? Heart failure Brother   ? ? ?Social History:  reports that he quit smoking about 6 years ago. His smoking use included cigarettes. He has a 22.50 pack-year smoking history. He has never used smokeless tobacco. He reports that he does not currently use alcohol after a past usage of about 3.0 standard drinks per week. He reports that he does not use drugs. ? ?Allergies:  ?Allergies  ?Allergen Reactions  ? Penicillins Other (See Comments)  ?  Happened at 66 years old and pt. stated he passed out  ?He has tolerated amoxicillin/clavulanate and ampicillin/sulbactam  ? Statins   ?  Other reaction(s): Muscle Pain ?Causes legs to ache per pt  ? ? ?Medications Prior to Admission  ?Medication Sig Dispense Refill  ? amiodarone (PACERONE) 200 MG tablet Take 200 mg by mouth daily.    ? apixaban (ELIQUIS) 5 MG TABS tablet Take 1 tablet (5 mg total) by mouth 2 (two) times daily. 60 tablet 5  ? aspirin EC 81 MG EC tablet Take 1 tablet (81 mg total) by mouth daily. Swallow whole. 30 tablet 0  ? Cholecalciferol 25 MCG (1000 UT) tablet Take 1,000 Units by mouth daily.    ? Cyanocobalamin (VITAMIN B-12) 5000 MCG TBDP Take 10,000 mcg by mouth daily.    ? digoxin (LANOXIN) 0.125 MG tablet Take 0.0625 mg by mouth daily.    ? diltiazem (CARDIZEM CD) 300 MG 24 hr capsule Take 1 capsule (300 mg total) by mouth daily.    ? ezetimibe (ZETIA) 10 MG tablet Take 10 mg by mouth at bedtime.    ? ferrous sulfate 325 (65 FE) MG tablet Take 325 mg by mouth daily with breakfast.    ? furosemide (LASIX) 20 MG tablet Take 20 mg by mouth daily. (Take with '40mg'$  tablet to equal '60mg'$  total)     ? furosemide (LASIX) 40 MG tablet Take 40 mg by mouth daily. (Take with '20mg'$  tablet to equal '60mg'$  total)    ? gabapentin (NEURONTIN) 300 MG capsule Take 300 mg by mouth at bedtime.    ? insulin glargine (LANTUS) 100 UNIT/ML Solostar Pen Inject 40 Units into the skin in the morning and at bedtime.    ? insulin lispro (HUMALOG) 100 UNIT/ML KwikPen Inject 15 Units into the skin 3 (three) times daily with meals. (Plus 0-15u sliding scale correction based upon glucose reading)    ? isosorbide mononitrate (IMDUR) 60 MG 24 hr tablet Take 60 mg by mouth 2 (two) times daily.    ? losartan (COZAAR) 50 MG tablet Take 50 mg by mouth daily.    ? metFORMIN (GLUCOPHAGE) 1000 MG tablet Take 1,000 mg by  mouth 2 (two) times daily.     ? metoprolol tartrate (LOPRESSOR) 50 MG tablet Take 75 mg by mouth 2 (two) times daily.    ? Multiple Vitamins-Minerals (MULTIVITAMIN WITH MINERALS) tablet Take 1 tablet by mouth daily.    ? nitroGLYCERIN (NITROSTAT) 0.4 MG SL tablet Place 0.4 mg under the tongue every 5 (five) minutes as needed for chest pain.    ? pramipexole (MIRAPEX) 1 MG tablet Take 2 mg by mouth at bedtime.    ? primidone (MYSOLINE) 250 MG tablet Take 250 mg by mouth 2 (two) times daily.    ? tamsulosin (FLOMAX) 0.4 MG CAPS capsule Take 0.4 mg by mouth daily.    ? traMADol (ULTRAM) 50 MG tablet Take 50 mg by mouth daily as needed for moderate pain.    ? vitamin C (ASCORBIC ACID) 500 MG tablet Take 500 mg by mouth daily.    ? zinc gluconate 50 MG tablet Take 50 mg by mouth daily.    ? ? ?Physical Exam: ?General: Alert and oriented.  No apparent distress. ? ?Dressings intact to bilateral lower extremities.  Wound VAC appears to still have excellent seal to the left heel.  Did not remove dressings today. ? ?Results for orders placed or performed during the hospital encounter of 08/04/21 (from the past 48 hour(s))  ?Glucose, capillary     Status: Abnormal  ? Collection Time: 08/07/21  2:10 PM  ?Result Value Ref Range  ?  Glucose-Capillary 58 (L) 70 - 99 mg/dL  ?  Comment: Glucose reference range applies only to samples taken after fasting for at least 8 hours.  ?Glucose, capillary     Status: None  ? Collection Time: 08/07/21  3:18 PM  ?Resul

## 2021-08-10 DIAGNOSIS — M86162 Other acute osteomyelitis, left tibia and fibula: Secondary | ICD-10-CM | POA: Diagnosis not present

## 2021-08-10 LAB — AEROBIC/ANAEROBIC CULTURE W GRAM STAIN (SURGICAL/DEEP WOUND): Gram Stain: NONE SEEN

## 2021-08-10 LAB — GLUCOSE, CAPILLARY
Glucose-Capillary: 101 mg/dL — ABNORMAL HIGH (ref 70–99)
Glucose-Capillary: 140 mg/dL — ABNORMAL HIGH (ref 70–99)
Glucose-Capillary: 133 mg/dL — ABNORMAL HIGH (ref 70–99)
Glucose-Capillary: 163 mg/dL — ABNORMAL HIGH (ref 70–99)

## 2021-08-10 NOTE — Progress Notes (Signed)
[place] [facility] ? ? ?PATIENT NAME: [name]   ? ?MR#:  [id number] ? ?DATE OF BIRTH:  [birth year] ? ?SUBJECTIVE:  ?Resting, no new complaints ? ? ?VITALS:  ?Blood pressure 118/77, pulse (!) 107, temperature 98 ?F (36.7 ?C), resp. rate 17, height 6' 4.5" (1.943 m), weight (!) 158.8 kg, SpO2 93 %. ? ?PHYSICAL EXAMINATION:  ? ?GENERAL:  66 y.o.-year-old patient lying in the bed with no acute distress. Obese ?LUNGS: Normal breath sounds bilaterally, no wheezing, rales, rhonchi.  ?CARDIOVASCULAR: S1, S2 normal. No murmurs, rubs, or gallops.  ?ABDOMEN: Soft, nontender, nondistended. Bowel sounds present.  ?EXTREMITIES:  ? ? ?NEUROLOGIC: nonfocal  patient is alert and awake ?SKIN: as above ? ?LABORATORY PANEL:  ?CBC ?Recent Labs  ?Lab 08/08/21 ?1639  ?WBC 7.4  ?HGB 10.2*  ?HCT 34.6*  ?PLT 209  ? ? ? ?Chemistries  ?Recent Labs  ?Lab 08/08/21 ?1639  ?NA 135  ?K 5.0  ?CL 101  ?CO2 26  ?GLUCOSE 249*  ?BUN 49*  ?CREATININE 1.20  ?CALCIUM 8.3*  ?AST 35  ?ALT 38  ?ALKPHOS 246*  ?BILITOT 0.7  ? ? ?Cardiac Enzymes ?No results for input(s): TROPONINI in the last 168 hours. ?RADIOLOGY:  ?DG Chest Port 1 View ? ?Result Date: 08/08/2021 ?CLINICAL DATA:  PICC placement. EXAM: PORTABLE CHEST 1 VIEW COMPARISON:  Chest radiograph 05/08/2021 FINDINGS: Right upper extremity PICC tip in the lower SVC. No pneumothorax. Patient is post median sternotomy. Similar cardiomegaly. Vascular congestion with suggestion of mild pulmonary edema. No pleural effusion or confluent consolidation. IMPRESSION: 1. Tip of the right upper extremity PICC in the lower SVC. 2. Cardiomegaly with vascular congestion and mild pulmonary edema. Electronically Signed   By: Keith Rake M.D.   On: 08/08/2021 19:24   ? ?Assessment and Plan ?66 year old male with past medical history of poorly controlled diabetes on insulin, hypertension, morbid obesity, COPD, atrial fibrillation on anticoagulation, CAD status post CABG  obstructive sleep apnea and PAD with a chronic left foot ulcer who presented to the emergency room after being sent over from the wound clinic for worsening cellulitis of his left foot that was not responding to outpatient antibiotics.  Patient admitted to the hospitalist service and podiatry consulted.  An MRI of the left ankle was done noting early osteomyelitis.  Given concerns for BKA, podiatry took patient on 4/10 for wound debridement and partial calcanectomy.  Postop, they are recommending 6 weeks of IV antibiotics.  Infectious disease consulted.   ? ? ?Acute osteomyelitis of lower leg, left (HCC) ?--High risk for BKA in the future.  ?-- Patient is status post debridement and partial calcanectomy 4/10.   ?--Infectious disease consulted.  Plan will be for IV antibiotics for the next 6 weeks.  ?-- PICC line placed 08/09/21 ?  ?Cellulitis of left foot ?--Chronic nonhealing wound and now with concerns for osteomyelitis. ?--  Initially on vancomycin and cefepime. --now on IV zosyn till 09/17/21 ?--Continue wound care.  ?--Cont  Wound VAC  ?--  Patient requesting increase and Ultram for pain ?  ?Insulin dependent type 2 diabetes mellitus ([place]) ?--Poorly controlled.  A1c of 10.2.   ?-Appreciate diabetic coordinator's help.  Postsurgery ?-- Lantus decreased to 35 units twice a day  ?-- sliding scale and adding NovoLog 4 units 3 times daily with meals. ?- Diabetic/carb modified/heart healthy diet ordered ?  ?Hypersomnia with sleep apnea ?--Continue nightly CPAP ?  ?Atrial fibrillation, chronic ([place]) ?--Continued on Cardizem and amiodarone plus  metoprolol. ?-- resumed Eliquis now post op ?--  Currently rate controlled and appears to be in sinus at times. ?  ?Essential hypertension, benign ?Blood pressure stable during this hospitalization.  ?-- Continue ARB and beta-blocker. ?  ?CAD (coronary artery disease) ?-- during this hospitalization.   ?--Continue aspirin and statin plus antihypertensives. ?  ? Pure  hypercholesterolemia ?--Continue Zetia ?  ?Venous stasis of both lower extremities ?--Continue diuretic and Ace wrap. ?  ? BPH (benign prostatic hyperplasia) ?--Continue Flomax ?  ?Obesity, Class III, BMI 40-49.9 (morbid obesity) ([place]) ? Patient meets criteria BMI greater than 40. ? ?OPAT Orders ?Diagnosis - Diabetic foot infection with left heel ulcer ?Discharge antibiotics: ?Zosyn 3.375grams Iv every 6 hours for 6 weeks  ?End Date: ?09/17/21 ?  ?[facility] Care Per Protocol: ?  ?Labs weekly while on IV antibiotics: ?_X_ CBC with differential ?__ BMP ?_X_ CMP ?_X_ CRP ?_X_ ESR ?  ?_X_ Please pull PIC at completion of IV antibiotics ?  ?Fax weekly labs to [phone] ?Clinic Follow Up Appt: 08/29/21 at 10.30 AM--dr ravishanfar ? ?Procedures:status post debridement and partial calcanectomy 4/10.  ?Family communication :wife in the room ?Consults : podiatry, ID ?CODE STATUS: full ?DVT Prophylaxis : eliquis ?Level of care: Med-Surg ?Status is: Inpatient ?Remains inpatient appropriate because: patient medical improving will discharged to rehab once bed available. ?Per TOC pt chose [facility] ?Awaiting insurance auth ?  ? ?TOTAL TIME TAKING CARE OF THIS PATIENT: 25 minutes.  ?>50% time spent on counselling and coordination of care ? ?Note: This dictation was prepared with Dragon dictation along with smaller phrase technology. Any transcriptional errors that result from this process are unintentional. ? ?Fritzi Mandes M.D  ? ? ?[facility]  ? ?CC: ?Primary care physician; [name], MD  ?

## 2021-08-10 NOTE — Progress Notes (Incomplete)
? ?Date of Admission:  08/04/2021    ? ?ID: Alec Less. is a 66 y.o. male  ?Principal Problem: ?  Acute osteomyelitis of lower leg, left ([place]) ?Active Problems: ?  Essential hypertension, benign ?  CAD (coronary artery disease) ?  Hypersomnia with sleep apnea ?  Pure hypercholesterolemia ?  Obesity, Class III, BMI 40-49.9 (morbid obesity) ([place]) ?  BPH (benign prostatic hyperplasia) ?  Venous stasis of both lower extremities ?  Cellulitis of left foot ?  Insulin dependent type 2 diabetes mellitus ([place]) ?  Atrial fibrillation, chronic ([place]) ? ? ? ?Subjective: ?Pt doing better ?Pain much better ?Eating well ?No diarrhea ?No cough or SOB ? ?Medications:  ? amiodarone  200 mg Oral QAC breakfast  ? apixaban  5 mg Oral BID  ? vitamin C  500 mg Oral Daily  ? aspirin EC  81 mg Oral Daily  ? Chlorhexidine Gluconate Cloth  6 each Topical Daily  ? diltiazem  300 mg Oral QAC breakfast  ? ezetimibe  10 mg Oral QHS  ? ferrous sulfate  325 mg Oral Q breakfast  ? furosemide  40 mg Oral Daily  ? gabapentin  300 mg Oral QHS  ? insulin aspart  0-20 Units Subcutaneous TID WC  ? insulin aspart  0-5 Units Subcutaneous QHS  ? insulin aspart  4 Units Subcutaneous TID WC  ? insulin glargine-yfgn  35 Units Subcutaneous BID  ? isosorbide mononitrate  60 mg Oral BID  ? losartan  100 mg Oral Daily  ? metFORMIN  1,000 mg Oral BID WC  ? metoprolol tartrate  75 mg Oral BID  ? multivitamin with minerals  1 tablet Oral Daily  ? pramipexole  2 mg Oral QHS  ? primidone  250 mg Oral BID  ? sodium chloride flush  10-40 mL Intracatheter Q12H  ? tamsulosin  0.4 mg Oral Daily  ? vitamin B-12  10,000 mcg Oral Daily  ? zinc sulfate  220 mg Oral Daily  ? ? ?Objective: ?Vital signs in last 24 hours: ?Temp:  [97.5 ?F (36.4 ?C)-98 ?F (36.7 ?C)] 98 ?F (36.7 ?C) (04/13 0735) ?Pulse Rate:  [107] 107 (04/13 0735) ?Resp:  [16-17] 17 (04/13 0735) ?BP: (118-122)/(77-85) 118/77 (04/13 0735) ?SpO2:  [92 %-94 %] 93 % (04/13 0735) ? ?PHYSICAL EXAM:  ?General:  Alert, cooperative, no distress, appears stated age.  ?Head: Normocephalic, without obvious abnormality, atraumatic. ?Eyes: Conjunctivae clear, anicteric sclerae. Pupils are equal ?ENT Nares normal. No drainage or sinus tenderness. ?Lips, mucosa, and tongue normal. No Thrush ?Neck: Supple, symmetrical, no adenopathy, thyroid: non tender ?no carotid bruit and no JVD. ?Back: No CVA tenderness. ?Lungs: Clear to auscultation bilaterally. No Wheezing or Rhonchi. No rales. ?Heart: irregular- well controlled ?Abdomen: Soft, non-tender,not distended. Bowel sounds normal. No masses ?Extremities: rt leg venous edema, stasis, eczematous changes ?Left heel ulcer covered with wound vac ?Skin: No rashes or lesions. Or bruising ?Lymph: Cervical, supraclavicular normal. ?Neurologic: Grossly non-focal ? ?Lab Results ?Recent Labs  ?  08/08/21 ?1639  ?WBC 7.4  ?HGB 10.2*  ?HCT 34.6*  ?NA 135  ?K 5.0  ?CL 101  ?CO2 26  ?BUN 49*  ?CREATININE 1.20  ? ?Liver Panel ?Recent Labs  ?  08/08/21 ?1639  ?PROT 7.0  ?ALBUMIN 2.6*  ?AST 35  ?ALT 38  ?ALKPHOS 246*  ?BILITOT 0.7  ? ?Sedimentation Rate ?No results for input(s): ESRSEDRATE in the last 72 hours. ?C-Reactive Protein ?No results for input(s): CRP in the last 72  hours. ? ?Microbiology: ? ?Studies/Results: ?DG Chest Port 1 View ? ?Result Date: 08/08/2021 ?CLINICAL DATA:  PICC placement. EXAM: PORTABLE CHEST 1 VIEW COMPARISON:  Chest radiograph 05/08/2021 FINDINGS: Right upper extremity PICC tip in the lower SVC. No pneumothorax. Patient is post median sternotomy. Similar cardiomegaly. Vascular congestion with suggestion of mild pulmonary edema. No pleural effusion or confluent consolidation. IMPRESSION: 1. Tip of the right upper extremity PICC in the lower SVC. 2. Cardiomegaly with vascular congestion and mild pulmonary edema. Electronically Signed   By: Keith Rake M.D.   On: 08/08/2021 19:24   ? ? ?Assessment/Plan: ? ?Left heel ulcer with cellulitis of the foot.  Patient underwent  debridement yesterday. ?Pathology no osteomyelitis ?Culture multiple organisms ?Will change unasyn to zosyn as kleb oxytoca along with proteus, anerobes. Will give for 4-6 weeks ? ?Antibiotic alone is not adequate for his wound healing.  He needs to offload that foot.  And also have good diabetes control for wound healing.  Weight reduction is also an important component of wound healing ?Diabetes mellitus with peripheral neuropathy causing multiple infections of the feet and toes ?On insulin ? ?A-fib on diltiazem amiodarone metoprolol and apixaban ? ?Hypercholesterolemia: On ezetimibe ? ? ?Hypertension on losartan and metoprolol ?BPH on tamsulosin ? ? ?Discussed the management with patient and his wife and care team ? ? ?OPAT Orders ?Diagnosis - Diabetic foot infection with left heel ulcer ?Discharge antibiotics: ?Zosyn 3.375grams Iv every 6 hours for 6 weeks  ?End Date: ?09/17/21 ? ?[facility] Care Per Protocol: ? ?Labs weekly while on IV antibiotics: ?_X_ CBC with differential ?__ BMP ?_X_ CMP ?_X_ CRP ?_X_ ESR ? ?_X_ Please pull PIC at completion of IV antibiotics ? ? ?Fax weekly labs to [phone] ? ?Clinic Follow Up Appt: 08/29/21 at 10.30 AM ? ? ?Call [phone] with any questions ? ?  ?  ?

## 2021-08-10 NOTE — Progress Notes (Signed)
PHARMACY CONSULT NOTE FOR: ? ?OUTPATIENT  PARENTERAL ANTIBIOTIC THERAPY (OPAT) ? ?Indication: Diabetic Foot Infection ?Regimen: Zosyn 3.375g IV q6h ?End date: 09/17/21 ? ?IV antibiotic discharge orders are pended. ?To discharging provider:  please sign these orders via discharge navigator,  ?Select New Orders & click on the button choice - Manage This Unsigned Work.  ?  ? ?Thank you for allowing pharmacy to be a part of this patient's care. ? ?Lestine Box, PharmD ?[id number] Infectious Diseases Pharmacy Resident ?  ?Please check [email or web address] for unit-specific pharmacy phone numbers  ?

## 2021-08-10 NOTE — Progress Notes (Signed)
Physical Therapy Treatment ?Patient Details ?Name: [name]. ?MRN: [id number] ?DOB: 12/04/1955 ?Today's Date: 08/10/2021 ? ? ?History of Present Illness Pt is a 66 y.o. male presenting to hospital 4/7 from wound clinic d/t concern of cellulitis and failing outpatient therapy.  Pt admitted with cellulitis, chronic a-fib, delayed wound healing (L heel).  PMH includes htn, IDDM, morbid obesity, depression, CAD s/p CABG 2006 and PCI to RCA in 12/2020, chronic B venous stasis on Unna boots, chronic L foot ulcer, COPD, OSA on CPAP nightly, ai-fib, PAD, s/p multiple toe amputations. ? ?  ?PT Comments  ? ? Physical Therapy Treatment completed this date. Patient tolerated session well and was agreeable to treatment. Upon arrival patient was supine in bed, with noted feces on the floor towards the bathroom. Patient reported going to the bathroom w/o calling for assistance. Patient stated he made it there safely on his scooter. NT called to help with clean up, RN notified, and patient educated on the importance of calling for help due to fall risk. Patient completed supine and EOB therapeutic exercises to work on BLE strengthening. Sit to stand from heightened EOB completed at supervision. In standing patient completed R SL stance with BUE support from RW at CGA/SBA x10 reps with a 3-4 second hold to work on balance and maintaining weight-bearing precautions. Patient completed a stand pivot transfer from EOB to recliner at CGA/SBA. Continues to demonstrate difficulty with maintaining NWB precautions. Patient is progressing towards his goals and would continue to benefit from skilled physical therapy in order to optimize patient's strength, safety, and functional ability to perform ADLs. Continue to recommend STR upon discharge from acute hospitalization.  ?  ?Recommendations for follow up therapy are one component of a multi-disciplinary discharge planning process, led by the attending physician.  Recommendations may  be updated based on patient status, additional functional criteria and insurance authorization. ? ?Follow Up Recommendations ? Skilled nursing-short term rehab (<3 hours/day) ?  ?  ?Assistance Recommended at Discharge Frequent or constant Supervision/Assistance  ?Patient can return home with the following A little help with walking and/or transfers;A little help with bathing/dressing/bathroom;Assistance with cooking/housework;Assist for transportation;Help with stairs or ramp for entrance ?  ?Equipment Recommendations ? None recommended by PT  ?  ?Recommendations for Other Services   ? ? ?  ?Precautions / Restrictions Precautions ?Precautions: Fall ?Restrictions ?Weight Bearing Restrictions: Yes ?LLE Weight Bearing: Non weight bearing  ?  ? ?Mobility ? Bed Mobility ?Overal bed mobility: Modified Independent ?  ?  ?  ?  ?  ?  ?  ?  ? ?Transfers ?Overall transfer level: Needs assistance ?Equipment used: Rolling walker (2 wheels) ?Transfers: Sit to/from Stand, Bed to chair/wheelchair/BSC ?Sit to Stand: Supervision, From elevated surface ?  ?Step pivot transfers: Min guard (SBA from EOB to recliner) ?  ?  ?  ?General transfer comment: difficulty maintaining NWB precautions ?  ? ?Ambulation/Gait ?  ?  ?  ?  ?  ?  ?  ?  ? ? ?Stairs ?  ?  ?  ?  ?  ? ? ?Wheelchair Mobility ?  ? ?Modified Rankin (Stroke Patients Only) ?  ? ? ?  ?Balance Overall balance assessment: Needs assistance ?Sitting-balance support: No upper extremity supported, Feet supported ?Sitting balance-Leahy Scale: Normal ?Sitting balance - Comments: steady sitting reaching outside BOS ?  ?Standing balance support: Bilateral upper extremity supported, Reliant on assistive device for balance ?Standing balance-Leahy Scale: Fair ?Standing balance comment: increased difficulty maintaining WBing precautions ?  ?  ?  ?  ?  ?  ?  ?  ?  ?  ?  ?  ? ?  ?  Cognition Arousal/Alertness: Awake/alert ?Behavior During Therapy: [facility] for tasks assessed/performed ?Overall Cognitive  Status: Within Functional Limits for tasks assessed ?  ?  ?  ?  ?  ?  ?  ?  ?  ?  ?  ?  ?  ?  ?  ?  ?  ?  ?  ? ?  ?Exercises General Exercises - Lower Extremity ?Long Arc Quad: Seated, 10 reps, AROM, Both ?Heel Slides: Supine, 10 reps, AROM, Both ?Straight Leg Raises: Supine, 15 reps, AROM, Both ?Hip Flexion/Marching: Seated, 10 reps, AROM, Both ?Other Exercises ?Other Exercises: R SL stance x10 with 3-5 second hold with BUE support from RW at CGA/SBA to work on SL balance and maintaining NWB precautions ? ?  ?General Comments   ?  ?  ? ?Pertinent Vitals/Pain Pain Assessment ?Pain Assessment: No/denies pain ?Pain Score: 2  ?Faces Pain Scale: Hurts a little bit ?Pain Location: L heel ?Pain Descriptors / Indicators: Sore, Aching ?Pain Intervention(s): Monitored during session  ? ? ?Home Living   ?  ?  ?  ?  ?  ?  ?  ?  ?  ?   ?  ?Prior Function    ?  ?  ?   ? ?PT Goals (current goals can now be found in the care plan section) Acute Rehab PT Goals ?Patient Stated Goal: to go home ?PT Goal Formulation: With patient ?Time For Goal Achievement: 08/19/21 ?Potential to Achieve Goals: Good ?Additional Goals ?Additional Goal #1: Pt with be able to navigate knee scooter safely >120 feet. ?Progress towards PT goals: Progressing toward goals ? ?  ?Frequency ? ? ? Min 2X/week ? ? ? ?  ?PT Plan Current plan remains appropriate  ? ? ?Co-evaluation   ?  ?  ?  ?  ? ?  ?AM-PAC PT "6 Clicks" Mobility   ?Outcome Measure ? Help needed turning from your back to your side while in a flat bed without using bedrails?: A Little ?Help needed moving from lying on your back to sitting on the side of a flat bed without using bedrails?: A Little ?Help needed moving to and from a bed to a chair (including a wheelchair)?: A Lot ?Help needed standing up from a chair using your arms (e.g., wheelchair or bedside chair)?: A Lot ?Help needed to walk in hospital room?: A Lot ?Help needed climbing 3-5 steps with a railing? : Total ?6 Click Score: 13 ? ?   ?End of Session Equipment Utilized During Treatment: Gait belt ?Activity Tolerance: Patient tolerated treatment well ?Patient left: in chair;with call bell/phone within reach;with chair alarm set ?Nurse Communication: Mobility status ?PT Visit Diagnosis: Other abnormalities of gait and mobility (R26.89);Muscle weakness (generalized) (M62.81);Pain ?Pain - Right/Left: Left ?Pain - part of body: Ankle and joints of foot ?  ? ? ?Time: 1610-9604 ?PT Time Calculation (min) (ACUTE ONLY): 17 min ? ?Charges:  $Therapeutic Exercise: 8-22 mins          ?          ? ?Iva Boop, PT  ?08/10/21. 3:49 PM ? ? ?

## 2021-08-10 NOTE — TOC Progression Note (Signed)
Transition of Care (TOC) - Progression Note  ? ? ?Patient Details  ?Name: [name] ?MRN: [id number] ?Date of Birth: 01-Dec-1955 ? ?Transition of Care (TOC) CM/SW Contact  ?Kerin Salen, RN ?Phone Number: ?08/10/2021, 2:41 PM ? ?Clinical Narrative: Spoke with patient about choice of SNF, [facility] or [facility]. [facility], says he was there before only complaint, the food was cold. However he prefers [facility]. Attempted to call wife twice no answer, left voice message. ?Spoke with Otila Kluver at [facility] they will order wound vac based on 5x5cm measurements given. NAVI authorization submitted.  ? ? ? ?  ?  ? ?Expected Discharge Plan and Services ?  ?  ?  ?  ?  ?                ?  ?  ?  ?  ?  ?  ?  ?  ?  ?  ? ? ?Social Determinants of Health (SDOH) Interventions ?  ? ?Readmission Risk Interventions ?   ? View : No data to display.  ?  ?  ?  ? ? ?

## 2021-08-11 ENCOUNTER — Encounter: Payer: Medicare Other | Admitting: Physician Assistant

## 2021-08-11 DIAGNOSIS — A419 Sepsis, unspecified organism: Secondary | ICD-10-CM | POA: Diagnosis not present

## 2021-08-11 DIAGNOSIS — L97424 Non-pressure chronic ulcer of left heel and midfoot with necrosis of bone: Secondary | ICD-10-CM | POA: Diagnosis not present

## 2021-08-11 DIAGNOSIS — R2689 Other abnormalities of gait and mobility: Secondary | ICD-10-CM | POA: Diagnosis not present

## 2021-08-11 DIAGNOSIS — E785 Hyperlipidemia, unspecified: Secondary | ICD-10-CM | POA: Diagnosis not present

## 2021-08-11 DIAGNOSIS — A000 Cholera due to Vibrio cholerae 01, biovar cholerae: Secondary | ICD-10-CM | POA: Diagnosis not present

## 2021-08-11 DIAGNOSIS — R531 Weakness: Secondary | ICD-10-CM | POA: Diagnosis not present

## 2021-08-11 DIAGNOSIS — E875 Hyperkalemia: Secondary | ICD-10-CM | POA: Diagnosis not present

## 2021-08-11 DIAGNOSIS — M86162 Other acute osteomyelitis, left tibia and fibula: Secondary | ICD-10-CM | POA: Diagnosis not present

## 2021-08-11 DIAGNOSIS — I4891 Unspecified atrial fibrillation: Secondary | ICD-10-CM | POA: Diagnosis not present

## 2021-08-11 DIAGNOSIS — D638 Anemia in other chronic diseases classified elsewhere: Secondary | ICD-10-CM | POA: Diagnosis not present

## 2021-08-11 DIAGNOSIS — S81801D Unspecified open wound, right lower leg, subsequent encounter: Secondary | ICD-10-CM | POA: Diagnosis not present

## 2021-08-11 DIAGNOSIS — G4730 Sleep apnea, unspecified: Secondary | ICD-10-CM | POA: Diagnosis not present

## 2021-08-11 DIAGNOSIS — L03116 Cellulitis of left lower limb: Secondary | ICD-10-CM | POA: Diagnosis not present

## 2021-08-11 DIAGNOSIS — E569 Vitamin deficiency, unspecified: Secondary | ICD-10-CM | POA: Diagnosis not present

## 2021-08-11 DIAGNOSIS — M6281 Muscle weakness (generalized): Secondary | ICD-10-CM | POA: Diagnosis not present

## 2021-08-11 DIAGNOSIS — R2681 Unsteadiness on feet: Secondary | ICD-10-CM | POA: Diagnosis not present

## 2021-08-11 DIAGNOSIS — N400 Enlarged prostate without lower urinary tract symptoms: Secondary | ICD-10-CM | POA: Diagnosis not present

## 2021-08-11 DIAGNOSIS — M86672 Other chronic osteomyelitis, left ankle and foot: Secondary | ICD-10-CM | POA: Diagnosis not present

## 2021-08-11 DIAGNOSIS — I10 Essential (primary) hypertension: Secondary | ICD-10-CM | POA: Diagnosis not present

## 2021-08-11 DIAGNOSIS — R41841 Cognitive communication deficit: Secondary | ICD-10-CM | POA: Diagnosis not present

## 2021-08-11 DIAGNOSIS — Z7401 Bed confinement status: Secondary | ICD-10-CM | POA: Diagnosis not present

## 2021-08-11 DIAGNOSIS — I25119 Atherosclerotic heart disease of native coronary artery with unspecified angina pectoris: Secondary | ICD-10-CM | POA: Diagnosis not present

## 2021-08-11 DIAGNOSIS — E1142 Type 2 diabetes mellitus with diabetic polyneuropathy: Secondary | ICD-10-CM | POA: Diagnosis not present

## 2021-08-11 DIAGNOSIS — E559 Vitamin D deficiency, unspecified: Secondary | ICD-10-CM | POA: Diagnosis not present

## 2021-08-11 DIAGNOSIS — I5033 Acute on chronic diastolic (congestive) heart failure: Secondary | ICD-10-CM | POA: Diagnosis not present

## 2021-08-11 DIAGNOSIS — E119 Type 2 diabetes mellitus without complications: Secondary | ICD-10-CM | POA: Diagnosis not present

## 2021-08-11 DIAGNOSIS — E7800 Pure hypercholesterolemia, unspecified: Secondary | ICD-10-CM | POA: Diagnosis not present

## 2021-08-11 DIAGNOSIS — M6259 Muscle wasting and atrophy, not elsewhere classified, multiple sites: Secondary | ICD-10-CM | POA: Diagnosis not present

## 2021-08-11 DIAGNOSIS — E11621 Type 2 diabetes mellitus with foot ulcer: Secondary | ICD-10-CM | POA: Diagnosis not present

## 2021-08-11 DIAGNOSIS — E0590 Thyrotoxicosis, unspecified without thyrotoxic crisis or storm: Secondary | ICD-10-CM | POA: Diagnosis not present

## 2021-08-11 DIAGNOSIS — D649 Anemia, unspecified: Secondary | ICD-10-CM | POA: Diagnosis not present

## 2021-08-11 DIAGNOSIS — R5381 Other malaise: Secondary | ICD-10-CM | POA: Diagnosis not present

## 2021-08-11 DIAGNOSIS — G2581 Restless legs syndrome: Secondary | ICD-10-CM | POA: Diagnosis not present

## 2021-08-11 DIAGNOSIS — I2510 Atherosclerotic heart disease of native coronary artery without angina pectoris: Secondary | ICD-10-CM | POA: Diagnosis not present

## 2021-08-11 LAB — GLUCOSE, CAPILLARY
Glucose-Capillary: 68 mg/dL — ABNORMAL LOW (ref 70–99)
Glucose-Capillary: 66 mg/dL — ABNORMAL LOW (ref 70–99)
Glucose-Capillary: 115 mg/dL — ABNORMAL HIGH (ref 70–99)
Glucose-Capillary: 178 mg/dL — ABNORMAL HIGH (ref 70–99)

## 2021-08-11 LAB — CREATININE, SERUM
Creatinine, Ser: 1.04 mg/dL (ref 0.61–1.24)
GFR, Estimated: >60 mL/min (ref >60)

## 2021-08-11 MED ORDER — PIPERACILLIN-TAZOBACTAM IV (FOR PTA / DISCHARGE USE ONLY): 3.3750 g | Freq: Four times a day (QID) | INTRAVENOUS | 0 refills | Status: DC

## 2021-08-11 MED ORDER — TRAMADOL HCL 50 MG PO TABS: 50.0000 mg | ORAL_TABLET | Freq: Every day | ORAL | 0 refills | Status: DC | PRN

## 2021-08-11 MED ORDER — INSULIN GLARGINE-YFGN 100 UNIT/ML ~~LOC~~ SOLN
25.0000 [IU] | Freq: Two times a day (BID) | SUBCUTANEOUS | Status: DC
  Filled 2021-08-11: qty 0.25

## 2021-08-11 MED ORDER — SODIUM CHLORIDE 0.9 % IV SOLN: INTRAVENOUS | Status: DC | PRN

## 2021-08-11 MED ORDER — PIPERACILLIN-TAZOBACTAM IV (FOR PTA / DISCHARGE USE ONLY): 3.3750 g | Freq: Four times a day (QID) | INTRAVENOUS | 0 refills | Status: AC

## 2021-08-11 MED ORDER — INSULIN GLARGINE 100 UNIT/ML SOLOSTAR PEN: 25.0000 [IU] | PEN_INJECTOR | Freq: Two times a day (BID) | SUBCUTANEOUS | 11 refills | Status: DC

## 2021-08-11 MED ORDER — INSULIN GLARGINE-YFGN 100 UNIT/ML ~~LOC~~ SOLN: 30.0000 [IU] | Freq: Two times a day (BID) | SUBCUTANEOUS | Status: DC

## 2021-08-11 NOTE — Care Management Important Message (Signed)
Important Message ? ?Patient Details  ?Name: [name]. ?MRN: [id number] ?Date of Birth: 1955/11/28 ? ? ?Medicare Important Message Given:  Yes ? ? ? ? ?[name] ?08/11/2021, 12:33 PM ?

## 2021-08-11 NOTE — Consult Note (Signed)
[place] Nurse wound follow up ?Wound type: Routine NPWT dressing change over left heel. Heel wound with antibiotic beads in place and Adaptic nonadherent gauze sutured to skin to secure beads. ?Measurement:Per last visit, 3.7cm x 5cm x 1.2cm ?Wound bed: deep red beneath Adaptic ?Drainage (amount, consistency, odor) musty odor consistent with old blood ?Periwound: intact, dry ?Dressing procedure/placement/frequency: Patient is premedicated for pain by Bedside RN. ?NPWT dressing removed, foot cleansed with tap water moistened washcloth and patted dry. Two pieces of black foam removed and discarded. ?One skin barrier ring is used to encircle wound and one piece of black foam used to cover defect; this is sealed with drape. A second piece of black foam is used to form a bridge and placed upon a layer of drape that covers the anterior foot, for two (total) pieces of black foam used. This is also covered with drape and the dressing connected to 176mHg continuous negative pressure. An immediate seal is achieved.  ?The tubing is padded with an ABD pad and the dressing secured from just below toes to just below knee with Kerlix roll gauze. This is covered with two, 6-inch ACE bandages. The tubing is labeled with the number and type of wound contact layers used, the date and measurements as we last know them.  ? ?Patient tolerated procedure well. ? ?Supplies are in room for next dressing change:  2 skin barrier rings, 1 medium dressing kit. ? ?Next dressing change is due on Monday, April 17. ? ?[facility]ursing team will follow, and will remain available to this patient, the nursing and medical teams.  ?  ?Thanks, ?LMaudie Flakes MSN, RN, [facility] [place] CWON-AP, [place] ?Pager# ([phone] ? ?  ? ? ?  ?

## 2021-08-11 NOTE — TOC Progression Note (Signed)
Transition of Care (TOC) - Progression Note  ? ? ?Patient Details  ?Name: [name]. ?MRN: [id number] ?Date of Birth: 16-Feb-1956 ? ?Transition of Care (TOC) CM/SW Contact  ?Pete Pelt, RN ?Phone Number: ?08/11/2021, 11:21 AM ? ?Clinical Narrative:   Patient discharging to [facility] today okay as per Otila Kluver.  As per Otila Kluver, the wound vac is at the facility.  Patient can go to room 710 and he will transport via EMS  Patient is aware of transfer today. ? ? ? ?  ?  ? ?Expected Discharge Plan and Services ?  ?  ?  ?  ?  ?Expected Discharge Date: 08/11/21               ?  ?  ?  ?  ?  ?  ?  ?  ?  ?  ? ? ?Social Determinants of Health (SDOH) Interventions ?  ? ?Readmission Risk Interventions ?   ? View : No data to display.  ?  ?  ?  ? ? ?

## 2021-08-11 NOTE — Plan of Care (Signed)
°  Problem: Education: °Goal: Knowledge of General Education information will improve °Description: Including pain rating scale, medication(s)/side effects and non-pharmacologic comfort measures °Outcome: Progressing °  °Problem: Clinical Measurements: °Goal: Ability to maintain clinical measurements within normal limits will improve °Outcome: Progressing °Goal: Respiratory complications will improve °Outcome: Progressing °Goal: Cardiovascular complication will be avoided °Outcome: Progressing °  °Problem: Activity: °Goal: Risk for activity intolerance will decrease °Outcome: Progressing °  °Problem: Nutrition: °Goal: Adequate nutrition will be maintained °Outcome: Progressing °  °

## 2021-08-11 NOTE — Progress Notes (Signed)
Inpatient Diabetes Program Recommendations ? ?AACE/ADA: New Consensus Statement on Inpatient Glycemic Control (2015) ? ?Target Ranges:  Prepandial:   less than 140 mg/dL ?     [facility] postprandial:   less than 180 mg/dL (1-2 hours) ?     Critically ill patients:  140 - 180 mg/dL  ? ?Lab Results  ?Component Value Date  ? GLUCAP 115 (H) 08/11/2021  ? HGBA1C 10.2 (H) 08/05/2021  ? ? ?Review of Glycemic Control ? Latest Reference Range & Units 08/10/21 16:18 08/10/21 21:19 08/11/21 08:06 08/11/21 08:38 08/11/21 10:02  ?Glucose-Capillary 70 - 99 mg/dL 133 (H) 163 (H) 68 (L) 66 (L) 115 (H)  ? ?Diabetes history: DM 2 ?Outpatient Diabetes medications:  ?Lantus 40 units bid ?Humalog 15 units tid with meals ?Metformin 1000 mg bid ?Current orders for Inpatient glycemic control:  ?Novolog resistant tid with meals and HS ?Semglee 35 units bid ? ?Inpatient Diabetes Program Recommendations:   ? ?Note mild low blood sugar this AM.  Consider reducing Semglee to 30 units bid.  ? ?Thanks,  ?Adah Perl, RN, BC-ADM ?Inpatient Diabetes Coordinator ?Pager [phone]  (8a-5p) ? ? ?

## 2021-08-11 NOTE — Discharge Summary (Signed)
?Physician Discharge Summary ?  ?Patient: [name]. MRN: [id number] DOB: Mar 20, 1956  ?Admit date:     08/04/2021  ?Discharge date: 08/11/21  ?Discharge Physician: Fritzi Mandes  ? ?PCP: [name], MD  ? ?Recommendations at discharge:  ? ?follow-up Dr. Luana Shu podiatry in 1 to 2 weeks ?follow-up primary care physician and two weeks or sooner after discharge from rehab ?follow-up Dr. Steva Ready infectious disease second me at 10:30 AM ? ?Discharge Diagnoses: ?Acute osteomyelitis left lower leg status post debridement and partial calcanectomy on 08/07/21 ? ?Hospital Course: ? ?66 year old male with past medical history of poorly controlled diabetes on insulin, hypertension, morbid obesity, COPD, atrial fibrillation on anticoagulation, CAD status post CABG obstructive sleep apnea and PAD with a chronic left foot ulcer who presented to the emergency room after being sent over from the wound clinic for worsening cellulitis of his left foot that was not responding to outpatient antibiotics.  Patient admitted to the hospitalist service and podiatry consulted.  An MRI of the left ankle was done noting early osteomyelitis.  Given concerns for BKA, podiatry took patient on 4/10 for wound debridement and partial calcanectomy.  Postop, they are recommending 6 weeks of IV antibiotics.  Infectious disease consulted.   ?  ?  ?Acute osteomyelitis of lower leg, left (HCC) ?--High risk for BKA in the future.  ?-- Patient is status post debridement and partial calcanectomy 4/10.   ?--Infectious disease consulted.  Plan will be for IV antibiotics for the next 6 weeks.  ?-- PICC line placed 08/09/21 ?  ?Cellulitis of left foot ?--Chronic nonhealing wound and now with concerns for osteomyelitis. ?--  Initially on vancomycin and cefepime. --now on IV zosyn till 09/17/21 ?--Continue wound care.  ?--Cont  Wound VAC  ?-- prn pain meds ?  ?Insulin dependent type 2 diabetes mellitus ([place]) ?--Poorly controlled.  A1c of 10.2.    ?-Appreciate diabetic coordinator's help.  Postsurgery ?-- Lantus decreased to 25 units twice a day  ?-- sliding scale and cont metformin . Adjust insuoin dsoing according to sugars at home ?- Diabetic/carb modified/heart healthy diet ordered ?  ?Hypersomnia with sleep apnea ?--Continue nightly CPAP ?  ?Atrial fibrillation, chronic ([place]) ?--Continued on Cardizem and amiodarone plus metoprolol. ?-- resumed Eliquis now post op ?--  Currently rate controlled and appears to be in sinus at times. ?  ?Essential hypertension, benign ?Blood pressure stable during this hospitalization.  ?-- Continue ARB and beta-blocker. ?  ?CAD (coronary artery disease) ?--Continue aspirin and statin plus antihypertensives. ?  ? Pure hypercholesterolemia ?--Continue Zetia ?  ?Venous stasis of both lower extremities ?--Continue diuretic and Ace wrap. ?  ? BPH (benign prostatic hyperplasia) ?--Continue Flomax ?  ?Obesity, Class III, BMI 40-49.9 (morbid obesity) ([place]) ? Patient meets criteria BMI greater than 40. ?  ?OPAT Orders ?Diagnosis - Diabetic foot infection with left heel ulcer ?Discharge antibiotics: ?Zosyn 3.375grams Iv every 6 hours for 6 weeks  ?End Date: ?09/17/21 ?  ?[facility] Care Per Protocol: ?  ?Labs weekly while on IV antibiotics: ?_X_ CBC with differential ?__ BMP ?_X_ CMP ?_X_ CRP ?_X_ ESR ?  ?_X_ Please pull PIC at completion of IV antibiotics ?  ?Fax weekly labs to [phone] ?Clinic Follow Up Appt: 08/29/21 at 10.30 AM--dr ravishanfar ?  ?Procedures:status post debridement and partial calcanectomy 4/10.  ?Family communication :wife in the room ?Consults : podiatry, ID ?CODE STATUS: full ?DVT Prophylaxis : eliquis ? ?  ? ? ? ? ?Disposition: Rehabilitation facility ?Diet recommendation:  ?  Discharge Diet Orders (From admission, onward)  ? ?  Start     Ordered  ? 08/11/21 0000  Diet - low sodium heart healthy       ? 08/11/21 1101  ? 08/11/21 0000  Diet Carb Modified       ? 08/11/21 1101  ? ?  ?  ? ?  ? ?Cardiac and Carb  modified diet ?DISCHARGE MEDICATION: ?Allergies as of 08/11/2021   ? ?   Reactions  ? Penicillins Other (See Comments)  ? Happened at 66 years old and pt. stated he passed out  ?He has tolerated amoxicillin/clavulanate and ampicillin/sulbactam  ? Statins   ? Other reaction(s): Muscle Pain ?Causes legs to ache per pt  ? ?  ? ?  ?Medication List  ?  ? ?STOP taking these medications   ? ?digoxin 0.125 MG tablet ?Commonly known as: LANOXIN ?  ?insulin lispro 100 UNIT/ML KwikPen ?Commonly known as: HUMALOG ?  ? ?  ? ?TAKE these medications   ? ?amiodarone 200 MG tablet ?Commonly known as: PACERONE ?Take 200 mg by mouth daily. ?  ?apixaban 5 MG Tabs tablet ?Commonly known as: ELIQUIS ?Take 1 tablet (5 mg total) by mouth 2 (two) times daily. ?  ?aspirin 81 MG EC tablet ?Take 1 tablet (81 mg total) by mouth daily. Swallow whole. ?  ?Cholecalciferol 25 MCG (1000 UT) tablet ?Take 1,000 Units by mouth daily. ?  ?diltiazem 300 MG 24 hr capsule ?Commonly known as: CARDIZEM CD ?Take 1 capsule (300 mg total) by mouth daily. ?  ?ezetimibe 10 MG tablet ?Commonly known as: ZETIA ?Take 10 mg by mouth at bedtime. ?  ?ferrous sulfate 325 (65 FE) MG tablet ?Take 325 mg by mouth daily with breakfast. ?  ?furosemide 40 MG tablet ?Commonly known as: LASIX ?Take 40 mg by mouth daily. (Take with 5m tablet to equal 662mtotal) ?What changed: Another medication with the same name was removed. Continue taking this medication, and follow the directions you see here. ?  ?gabapentin 300 MG capsule ?Commonly known as: NEURONTIN ?Take 300 mg by mouth at bedtime. ?  ?insulin glargine 100 UNIT/ML Solostar Pen ?Commonly known as: LANTUS ?Inject 25 Units into the skin in the morning and at bedtime. ?What changed: how much to take ?  ?isosorbide mononitrate 60 MG 24 hr tablet ?Commonly known as: IMDUR ?Take 60 mg by mouth 2 (two) times daily. ?  ?losartan 50 MG tablet ?Commonly known as: COZAAR ?Take 50 mg by mouth daily. ?  ?metFORMIN 1000 MG  tablet ?Commonly known as: GLUCOPHAGE ?Take 1,000 mg by mouth 2 (two) times daily. ?  ?metoprolol tartrate 50 MG tablet ?Commonly known as: LOPRESSOR ?Take 75 mg by mouth 2 (two) times daily. ?  ?multivitamin with minerals tablet ?Take 1 tablet by mouth daily. ?  ?nitroGLYCERIN 0.4 MG SL tablet ?Commonly known as: NITROSTAT ?Place 0.4 mg under the tongue every 5 (five) minutes as needed for chest pain. ?  ?piperacillin-tazobactam  IVPB ?Commonly known as: ZOSYN ?Inject 3.375 g into the vein every 6 (six) hours. Indication:  Diabetic Foot Infection ?First Dose: Yes ?Last Day of Therapy:  09/17/21 ?Labs - Once weekly:  CBC/D, CMP, CRP, and ESR ?Please pull PIC at completion of IV antibiotics ?Fax weekly labs to ([phone] of administration: Elastomeric (Continuous infusion) ?Method of administration may be changed at the discretion of home infusion pharmacist based upon assessment of the patient and/or caregiver's ability to self-administer the medication ordered. ?  ?  pramipexole 1 MG tablet ?Commonly known as: MIRAPEX ?Take 2 mg by mouth at bedtime. ?  ?primidone 250 MG tablet ?Commonly known as: MYSOLINE ?Take 250 mg by mouth 2 (two) times daily. ?  ?tamsulosin 0.4 MG Caps capsule ?Commonly known as: FLOMAX ?Take 0.4 mg by mouth daily. ?  ?traMADol 50 MG tablet ?Commonly known as: ULTRAM ?Take 1 tablet (50 mg total) by mouth daily as needed for moderate pain. ?  ?Vitamin B-12 5000 MCG Tbdp ?Take 10,000 mcg by mouth daily. ?  ?vitamin C 500 MG tablet ?Commonly known as: ASCORBIC ACID ?Take 500 mg by mouth daily. ?  ?zinc gluconate 50 MG tablet ?Take 50 mg by mouth daily. ?  ? ?  ? ?  ?  ? ? ?  ?Discharge Care Instructions  ?(From admission, onward)  ?  ? ? ?  ? ?  Start     Ordered  ? 08/11/21 0000  Change dressing on IV access line weekly and PRN  (Home infusion instructions - [facility] )       ? 08/11/21 1101  ? 08/11/21 0000  Discharge wound care:       ?Comments: Negative pressure wound  therapy  Every Mon-Wed-Fri 1000    ?Comments: [place] Nurse to change NPWT to left heel, place foam over adaptic. Follow with dry boot (Kerlix/ACE) to bilateral LEs. ?Question Answer Comment ?Amount of suction? 125 mm/Hg   ?Suction

## 2021-08-12 LAB — AEROBIC/ANAEROBIC CULTURE W GRAM STAIN (SURGICAL/DEEP WOUND): Gram Stain: NONE SEEN

## 2021-08-14 DIAGNOSIS — M6281 Muscle weakness (generalized): Secondary | ICD-10-CM | POA: Diagnosis not present

## 2021-08-14 DIAGNOSIS — M86672 Other chronic osteomyelitis, left ankle and foot: Secondary | ICD-10-CM | POA: Diagnosis not present

## 2021-08-14 DIAGNOSIS — I4891 Unspecified atrial fibrillation: Secondary | ICD-10-CM | POA: Diagnosis not present

## 2021-08-14 DIAGNOSIS — I25119 Atherosclerotic heart disease of native coronary artery with unspecified angina pectoris: Secondary | ICD-10-CM | POA: Diagnosis not present

## 2021-08-16 DIAGNOSIS — D638 Anemia in other chronic diseases classified elsewhere: Secondary | ICD-10-CM | POA: Diagnosis not present

## 2021-08-16 DIAGNOSIS — E569 Vitamin deficiency, unspecified: Secondary | ICD-10-CM | POA: Diagnosis not present

## 2021-08-16 DIAGNOSIS — M86672 Other chronic osteomyelitis, left ankle and foot: Secondary | ICD-10-CM | POA: Diagnosis not present

## 2021-08-16 DIAGNOSIS — E11621 Type 2 diabetes mellitus with foot ulcer: Secondary | ICD-10-CM | POA: Diagnosis not present

## 2021-08-18 ENCOUNTER — Encounter: Payer: Medicare Other | Admitting: Physician Assistant

## 2021-08-21 DIAGNOSIS — I4891 Unspecified atrial fibrillation: Secondary | ICD-10-CM | POA: Diagnosis not present

## 2021-08-21 DIAGNOSIS — M86672 Other chronic osteomyelitis, left ankle and foot: Secondary | ICD-10-CM | POA: Diagnosis not present

## 2021-08-21 DIAGNOSIS — M6281 Muscle weakness (generalized): Secondary | ICD-10-CM | POA: Diagnosis not present

## 2021-08-23 DIAGNOSIS — M86672 Other chronic osteomyelitis, left ankle and foot: Secondary | ICD-10-CM | POA: Diagnosis not present

## 2021-08-23 DIAGNOSIS — M6281 Muscle weakness (generalized): Secondary | ICD-10-CM | POA: Diagnosis not present

## 2021-08-24 DIAGNOSIS — L97424 Non-pressure chronic ulcer of left heel and midfoot with necrosis of bone: Secondary | ICD-10-CM | POA: Diagnosis not present

## 2021-08-24 DIAGNOSIS — E1142 Type 2 diabetes mellitus with diabetic polyneuropathy: Secondary | ICD-10-CM | POA: Diagnosis not present

## 2021-08-24 DIAGNOSIS — L03116 Cellulitis of left lower limb: Secondary | ICD-10-CM | POA: Diagnosis not present

## 2021-08-24 DIAGNOSIS — R2689 Other abnormalities of gait and mobility: Secondary | ICD-10-CM | POA: Diagnosis not present

## 2021-08-25 ENCOUNTER — Encounter: Payer: Medicare Other | Admitting: Physician Assistant

## 2021-08-28 DIAGNOSIS — M86672 Other chronic osteomyelitis, left ankle and foot: Secondary | ICD-10-CM | POA: Diagnosis not present

## 2021-08-28 DIAGNOSIS — S81801D Unspecified open wound, right lower leg, subsequent encounter: Secondary | ICD-10-CM | POA: Diagnosis not present

## 2021-08-28 DIAGNOSIS — E875 Hyperkalemia: Secondary | ICD-10-CM | POA: Diagnosis not present

## 2021-08-28 DIAGNOSIS — M6281 Muscle weakness (generalized): Secondary | ICD-10-CM | POA: Diagnosis not present

## 2021-08-29 ENCOUNTER — Ambulatory Visit: Payer: Medicare Other | Attending: Infectious Diseases | Admitting: Infectious Diseases

## 2021-08-29 ENCOUNTER — Encounter: Payer: Self-pay | Admitting: Infectious Diseases

## 2021-08-29 ENCOUNTER — Telehealth: Payer: Self-pay

## 2021-08-29 VITALS — BP 135/89 | HR 107 | Temp 97.7°F | Ht 76.5 in

## 2021-08-29 DIAGNOSIS — E11628 Type 2 diabetes mellitus with other skin complications: Secondary | ICD-10-CM | POA: Diagnosis not present

## 2021-08-29 DIAGNOSIS — L03116 Cellulitis of left lower limb: Secondary | ICD-10-CM | POA: Diagnosis not present

## 2021-08-29 DIAGNOSIS — Z7901 Long term (current) use of anticoagulants: Secondary | ICD-10-CM | POA: Diagnosis not present

## 2021-08-29 DIAGNOSIS — I4891 Unspecified atrial fibrillation: Secondary | ICD-10-CM | POA: Insufficient documentation

## 2021-08-29 DIAGNOSIS — E1151 Type 2 diabetes mellitus with diabetic peripheral angiopathy without gangrene: Secondary | ICD-10-CM | POA: Insufficient documentation

## 2021-08-29 DIAGNOSIS — E785 Hyperlipidemia, unspecified: Secondary | ICD-10-CM | POA: Diagnosis not present

## 2021-08-29 DIAGNOSIS — L089 Local infection of the skin and subcutaneous tissue, unspecified: Secondary | ICD-10-CM

## 2021-08-29 DIAGNOSIS — N400 Enlarged prostate without lower urinary tract symptoms: Secondary | ICD-10-CM | POA: Diagnosis not present

## 2021-08-29 DIAGNOSIS — B961 Klebsiella pneumoniae [K. pneumoniae] as the cause of diseases classified elsewhere: Secondary | ICD-10-CM | POA: Diagnosis not present

## 2021-08-29 DIAGNOSIS — E7800 Pure hypercholesterolemia, unspecified: Secondary | ICD-10-CM | POA: Insufficient documentation

## 2021-08-29 DIAGNOSIS — E1142 Type 2 diabetes mellitus with diabetic polyneuropathy: Secondary | ICD-10-CM | POA: Diagnosis not present

## 2021-08-29 DIAGNOSIS — Z79899 Other long term (current) drug therapy: Secondary | ICD-10-CM | POA: Diagnosis not present

## 2021-08-29 DIAGNOSIS — I10 Essential (primary) hypertension: Secondary | ICD-10-CM

## 2021-08-29 MED ORDER — AMOXICILLIN-POT CLAVULANATE 875-125 MG PO TABS: 1.0000 | ORAL_TABLET | Freq: Two times a day (BID) | ORAL | 0 refills | Status: DC

## 2021-08-29 NOTE — Telephone Encounter (Addendum)
I attempted to contact the patient to let him know that Pam with [facility] was able get him switched to the IV infusion ball so he could be discharged home from the facility. I also advised him that patient will meet with him before he is discharged to go over instructions on infusing the medication. ?  Per Pam she spoke with the patient social worker  Ivin Booty) with [facility] and they will not be able to discharge patient until Thursday. Rehab facility is working on getting the patient a wound vac before discharge.  Ivin Booty also wanted to makes sure Dr. Delaine Lame feels the patient well enough to go home. Please advise that patient is well enough to go home. I ?

## 2021-08-29 NOTE — Patient Instructions (Signed)
You are here for follow up of the left foot ulcer/infection- you are on zosyn 3.375grams every 6 hrs until 5/21- let us know whether you will be leaving [facility] before that as the infusion company will have to send medications to your home and reteach you to do the infusion ?

## 2021-08-29 NOTE — Telephone Encounter (Signed)
Ivin Booty with [facility] called regarding patient being discharged. I advised her Dr. Delaine Lame saw patient in office today and is ok with patient being discharged home on IV antibiotics with IV infusion ball. She advised me she wound vac has been ordered for patient discharge should be Thursday.  ?

## 2021-08-29 NOTE — Progress Notes (Signed)
NAME: [name].  ?DOB: [birth year]  ?MRN: [id number]  ?Date/Time: 08/29/2021 10:35 AM ? ? ?Subjective:  ?Patient here for follow-up after recent hospitalization.  He is currently in [facility]. ?[name] is a 66 year old male with a history of diabetes mellitus, hypertension, hyperlipidemia, CAD, CABG, CHF, venous edema both legs with venous ulceration diabetic foot infection status post amputation of multiple toes both feet ?He was recently hospitalized between 08/04/2021 until 08/11/2021 for acute osteomyelitis of the left lower leg with wound and underwent partial calcanectomy and wound debridement ?Marland Kitchen  The culture was positive for Klebsiella, rare Proteus and rare Bacteroides.  He was sent on IV Zosyn to complete 6 weeks of antibiotics which will finish on 09/17/2021.  He has severe venous edema of both his legs.  He has a wound VAC on the left heel ?He saw a podiatrist as outpatient. ?He has been referred to wound clinic and has his first appointment this Friday.  He states he wants to go home from [facility] because he will have to start paying $200 a day after May 5. ? ?Past Medical History:  ?Diagnosis Date  ? Arrhythmia   ? atrial fibrillation  ? Asthma   ? CHF (congestive heart failure) ([place])   ? COPD (chronic obstructive pulmonary disease) ([place])   ? Coronary artery disease   ? Depression   ? Diabetes mellitus without complication ([place])   ? Gout   ? History anabolic steroid use   ? Hyperlipidemia   ? Hypertension   ? Hypogonadism in male   ? Morbid obesity ([place])   ? Myocardial infarction [facility])   ? Peripheral vascular disease ([place])   ? Perirectal abscess   ? Pleurisy   ? Sleep apnea   ? CPAP at night, no oxygen  ? Varicella   ?  ?Past Surgical History:  ?Procedure Laterality Date  ? ABDOMINAL AORTIC ANEURYSM REPAIR    ? ACHILLES TENDON SURGERY Left 01/10/2021  ? Procedure: ACHILLES LENGTHENING/KIDNER;  Surgeon: Caroline More, DPM;  Location: [facility];  Service: Podiatry;  Laterality: Left;   ? AMPUTATION TOE Right 02/10/2016  ? Procedure: AMPUTATION TOE 3RD TOE;  Surgeon: Samara Deist, DPM;  Location: [facility];  Service: Podiatry;  Laterality: Right;  ? AMPUTATION TOE Left 02/24/2020  ? Procedure: AMPUTATION TOE;  Surgeon: Caroline More, DPM;  Location: [facility];  Service: Podiatry;  Laterality: Left;  ? APPLICATION OF WOUND VAC Left 02/29/2020  ? Procedure: APPLICATION OF WOUND VAC;  Surgeon: Caroline More, DPM;  Location: [facility];  Service: Podiatry;  Laterality: Left;  ? COLONOSCOPY WITH PROPOFOL [name] 11/18/2015  ? Procedure: COLONOSCOPY WITH PROPOFOL;  Surgeon: Manya Silvas, MD;  Location: [facility] ENDOSCOPY;  Service: Endoscopy;  Laterality: [name];  ? CORONARY ARTERY BYPASS GRAFT    ? CORONARY STENT INTERVENTION [name] 02/02/2020  ? Procedure: CORONARY STENT INTERVENTION;  Surgeon: Isaias Cowman, MD;  Location: [place] [facility] LAB;  Service: Cardiovascular;  Laterality: [name];  ? INCISION AND DRAINAGE Left 08/07/2021  ? Procedure: INCISION AND DRAINAGE-Partial Calcanectomy;  Surgeon: Caroline More, DPM;  Location: [facility];  Service: Podiatry;  Laterality: Left;  ? IRRIGATION AND DEBRIDEMENT FOOT Left 02/29/2020  ? Procedure: IRRIGATION AND DEBRIDEMENT FOOT;  Surgeon: Caroline More, DPM;  Location: [facility];  Service: Podiatry;  Laterality: Left;  ? IRRIGATION AND DEBRIDEMENT FOOT Left 02/24/2020  ? Procedure: IRRIGATION AND DEBRIDEMENT FOOT;  Surgeon: Caroline More, DPM;  Location: [facility];  Service: Podiatry;  Laterality: Left;  ?  KNEE ARTHROSCOPY    ? LEFT HEART CATH AND CORS/GRAFTS ANGIOGRAPHY [name] 02/02/2020  ? Procedure: LEFT HEART CATH AND CORS/GRAFTS ANGIOGRAPHY;  Surgeon: Teodoro Spray, MD;  Location: [place] [facility];  Service: Cardiovascular;  Laterality: [name];  ? LOWER EXTREMITY ANGIOGRAPHY Left 02/25/2020  ? Procedure: Lower Extremity Angiography;  Surgeon: Algernon Huxley, MD;  Location: [place] [facility];  Service: Cardiovascular;  Laterality: Left;  ? LOWER EXTREMITY ANGIOGRAPHY  Left 01/04/2021  ? Procedure: LOWER EXTREMITY ANGIOGRAPHY;  Surgeon: Algernon Huxley, MD;  Location: [place] [facility];  Service: Cardiovascular;  Laterality: Left;  ? METATARSAL HEAD EXCISION Left 01/10/2021  ? Procedure: METATARSAL HEAD EXCISION - LEFT 5th;  Surgeon: Caroline More, DPM;  Location: [facility];  Service: Podiatry;  Laterality: Left;  ? PERIPHERAL VASCULAR CATHETERIZATION Right 01/24/2016  ? Procedure: Lower Extremity Angiography;  Surgeon: Katha Cabal, MD;  Location: [place] [facility];  Service: Cardiovascular;  Laterality: Right;  ? PERIPHERAL VASCULAR CATHETERIZATION Right 01/25/2016  ? Procedure: Lower Extremity Angiography;  Surgeon: Katha Cabal, MD;  Location: [place] [facility];  Service: Cardiovascular;  Laterality: Right;  ? TOE AMPUTATION    ? TONSILLECTOMY    ?  ?Social History  ? ?Socioeconomic History  ? Marital status: Married  ?  Spouse name: Not on file  ? Number of children: Not on file  ? Years of education: Not on file  ? Highest education level: Not on file  ?Occupational History  ? Not on file  ?Tobacco Use  ? Smoking status: Former  ?  Packs/day: 0.50  ?  Years: 45.00  ?  Pack years: 22.50  ?  Types: Cigarettes  ?  Quit date: 04/05/2015  ?  Years since quitting: 6.4  ? Smokeless tobacco: Never  ?Vaping Use  ? Vaping Use: Every day  ?Substance and Sexual Activity  ? Alcohol use: Not Currently  ?  Alcohol/week: 3.0 standard drinks  ?  Types: 3 Glasses of wine per week  ? Drug use: No  ? Sexual activity: Not on file  ?Other Topics Concern  ? Not on file  ?Social History Narrative  ? Not on file  ? ?Social Determinants of Health  ? ?Financial Resource Strain: Not on file  ?Food Insecurity: Not on file  ?Transportation Needs: Not on file  ?Physical Activity: Not on file  ?Stress: Not on file  ?Social Connections: Not on file  ?Intimate Partner Violence: Not on file  ?  ?Family History  ?Problem Relation Age of Onset  ? Hypertension Father   ? Coronary artery disease Father    ? Alcohol abuse Father   ? Heart failure Brother   ? ?Allergies  ?Allergen Reactions  ? Penicillins Other (See Comments)  ?  Happened at 66 years old and pt. stated he passed out  ?He has tolerated amoxicillin/clavulanate and ampicillin/sulbactam  ? Statins   ?  Other reaction(s): Muscle Pain ?Causes legs to ache per pt  ? ?I? ?Current Outpatient Medications  ?Medication Sig Dispense Refill  ? amiodarone (PACERONE) 200 MG tablet Take 200 mg by mouth daily.    ? apixaban (ELIQUIS) 5 MG TABS tablet Take 1 tablet (5 mg total) by mouth 2 (two) times daily. 60 tablet 5  ? aspirin EC 81 MG EC tablet Take 1 tablet (81 mg total) by mouth daily. Swallow whole. 30 tablet 0  ? Cholecalciferol 25 MCG (1000 UT) tablet Take 1,000 Units by mouth daily.    ?  Cyanocobalamin (VITAMIN B-12) 5000 MCG TBDP Take 10,000 mcg by mouth daily.    ? digoxin (LANOXIN) 0.125 MG tablet Take 0.125 mg by mouth daily.    ? diltiazem (CARDIZEM CD) 300 MG 24 hr capsule Take 1 capsule (300 mg total) by mouth daily.    ? ezetimibe (ZETIA) 10 MG tablet Take 10 mg by mouth at bedtime.    ? ferrous sulfate 325 (65 FE) MG tablet Take 325 mg by mouth daily with breakfast.    ? furosemide (LASIX) 40 MG tablet Take 40 mg by mouth daily. (Take with '20mg'$  tablet to equal '60mg'$  total)    ? gabapentin (NEURONTIN) 300 MG capsule Take 300 mg by mouth at bedtime.    ? HUMALOG 100 UNIT/ML injection Inject into the skin.    ? insulin glargine (LANTUS) 100 UNIT/ML Solostar Pen Inject 25 Units into the skin in the morning and at bedtime. 15 mL 11  ? isosorbide mononitrate (IMDUR) 60 MG 24 hr tablet Take 60 mg by mouth 2 (two) times daily.    ? losartan (COZAAR) 50 MG tablet Take 50 mg by mouth daily.    ? metFORMIN (GLUCOPHAGE) 1000 MG tablet Take 1,000 mg by mouth 2 (two) times daily.     ? metoprolol tartrate (LOPRESSOR) 50 MG tablet Take 75 mg by mouth 2 (two) times daily.    ? Multiple Vitamins-Minerals (MULTIVITAMIN WITH MINERALS) tablet Take 1 tablet by mouth daily.     ? nitroGLYCERIN (NITROSTAT) 0.4 MG SL tablet Place 0.4 mg under the tongue every 5 (five) minutes as needed for chest pain.    ? piperacillin-tazobactam (ZOSYN) IVPB Inject 3.375 g into the vein ever

## 2021-08-30 DIAGNOSIS — M6281 Muscle weakness (generalized): Secondary | ICD-10-CM | POA: Diagnosis not present

## 2021-08-30 DIAGNOSIS — I4891 Unspecified atrial fibrillation: Secondary | ICD-10-CM | POA: Diagnosis not present

## 2021-08-30 DIAGNOSIS — E7800 Pure hypercholesterolemia, unspecified: Secondary | ICD-10-CM | POA: Diagnosis not present

## 2021-08-30 DIAGNOSIS — M86672 Other chronic osteomyelitis, left ankle and foot: Secondary | ICD-10-CM | POA: Diagnosis not present

## 2021-08-30 DIAGNOSIS — S81801D Unspecified open wound, right lower leg, subsequent encounter: Secondary | ICD-10-CM | POA: Diagnosis not present

## 2021-08-31 DIAGNOSIS — E7800 Pure hypercholesterolemia, unspecified: Secondary | ICD-10-CM | POA: Diagnosis not present

## 2021-08-31 DIAGNOSIS — I10 Essential (primary) hypertension: Secondary | ICD-10-CM | POA: Diagnosis not present

## 2021-08-31 DIAGNOSIS — G720 Drug-induced myopathy: Secondary | ICD-10-CM | POA: Diagnosis not present

## 2021-08-31 DIAGNOSIS — L03116 Cellulitis of left lower limb: Secondary | ICD-10-CM | POA: Diagnosis not present

## 2021-09-01 ENCOUNTER — Encounter: Payer: Medicare Other | Admitting: Physician Assistant

## 2021-09-02 DIAGNOSIS — I110 Hypertensive heart disease with heart failure: Secondary | ICD-10-CM | POA: Diagnosis not present

## 2021-09-02 DIAGNOSIS — B964 Proteus (mirabilis) (morganii) as the cause of diseases classified elsewhere: Secondary | ICD-10-CM | POA: Diagnosis not present

## 2021-09-02 DIAGNOSIS — B961 Klebsiella pneumoniae [K. pneumoniae] as the cause of diseases classified elsewhere: Secondary | ICD-10-CM | POA: Diagnosis not present

## 2021-09-02 DIAGNOSIS — E1151 Type 2 diabetes mellitus with diabetic peripheral angiopathy without gangrene: Secondary | ICD-10-CM | POA: Diagnosis not present

## 2021-09-02 DIAGNOSIS — L03116 Cellulitis of left lower limb: Secondary | ICD-10-CM | POA: Diagnosis not present

## 2021-09-02 DIAGNOSIS — E1142 Type 2 diabetes mellitus with diabetic polyneuropathy: Secondary | ICD-10-CM | POA: Diagnosis not present

## 2021-09-02 DIAGNOSIS — E1169 Type 2 diabetes mellitus with other specified complication: Secondary | ICD-10-CM | POA: Diagnosis not present

## 2021-09-02 DIAGNOSIS — B966 Bacteroides fragilis [B. fragilis] as the cause of diseases classified elsewhere: Secondary | ICD-10-CM | POA: Diagnosis not present

## 2021-09-02 DIAGNOSIS — I872 Venous insufficiency (chronic) (peripheral): Secondary | ICD-10-CM | POA: Diagnosis not present

## 2021-09-02 DIAGNOSIS — I4891 Unspecified atrial fibrillation: Secondary | ICD-10-CM | POA: Diagnosis not present

## 2021-09-02 DIAGNOSIS — J449 Chronic obstructive pulmonary disease, unspecified: Secondary | ICD-10-CM | POA: Diagnosis not present

## 2021-09-02 DIAGNOSIS — Z4889 Encounter for other specified surgical aftercare: Secondary | ICD-10-CM | POA: Diagnosis not present

## 2021-09-02 DIAGNOSIS — I509 Heart failure, unspecified: Secondary | ICD-10-CM | POA: Diagnosis not present

## 2021-09-02 DIAGNOSIS — L97919 Non-pressure chronic ulcer of unspecified part of right lower leg with unspecified severity: Secondary | ICD-10-CM | POA: Diagnosis not present

## 2021-09-02 DIAGNOSIS — I2510 Atherosclerotic heart disease of native coronary artery without angina pectoris: Secondary | ICD-10-CM | POA: Diagnosis not present

## 2021-09-02 DIAGNOSIS — M86172 Other acute osteomyelitis, left ankle and foot: Secondary | ICD-10-CM | POA: Diagnosis not present

## 2021-09-04 DIAGNOSIS — L03116 Cellulitis of left lower limb: Secondary | ICD-10-CM | POA: Diagnosis not present

## 2021-09-06 DIAGNOSIS — G720 Drug-induced myopathy: Secondary | ICD-10-CM | POA: Diagnosis not present

## 2021-09-06 DIAGNOSIS — I10 Essential (primary) hypertension: Secondary | ICD-10-CM | POA: Diagnosis not present

## 2021-09-06 DIAGNOSIS — E7800 Pure hypercholesterolemia, unspecified: Secondary | ICD-10-CM | POA: Diagnosis not present

## 2021-09-07 DIAGNOSIS — L97912 Non-pressure chronic ulcer of unspecified part of right lower leg with fat layer exposed: Secondary | ICD-10-CM | POA: Diagnosis not present

## 2021-09-07 DIAGNOSIS — L03116 Cellulitis of left lower limb: Secondary | ICD-10-CM | POA: Diagnosis not present

## 2021-09-07 DIAGNOSIS — E1142 Type 2 diabetes mellitus with diabetic polyneuropathy: Secondary | ICD-10-CM | POA: Diagnosis not present

## 2021-09-07 DIAGNOSIS — L97424 Non-pressure chronic ulcer of left heel and midfoot with necrosis of bone: Secondary | ICD-10-CM | POA: Diagnosis not present

## 2021-09-07 DIAGNOSIS — I872 Venous insufficiency (chronic) (peripheral): Secondary | ICD-10-CM | POA: Diagnosis not present

## 2021-09-08 ENCOUNTER — Encounter: Payer: Medicare Other | Attending: Physician Assistant | Admitting: Physician Assistant

## 2021-09-08 DIAGNOSIS — L89893 Pressure ulcer of other site, stage 3: Secondary | ICD-10-CM | POA: Diagnosis not present

## 2021-09-08 DIAGNOSIS — I5042 Chronic combined systolic (congestive) and diastolic (congestive) heart failure: Secondary | ICD-10-CM | POA: Diagnosis not present

## 2021-09-08 DIAGNOSIS — L89624 Pressure ulcer of left heel, stage 4: Secondary | ICD-10-CM | POA: Diagnosis not present

## 2021-09-08 DIAGNOSIS — E11621 Type 2 diabetes mellitus with foot ulcer: Secondary | ICD-10-CM | POA: Diagnosis not present

## 2021-09-08 DIAGNOSIS — I110 Hypertensive heart disease with heart failure: Secondary | ICD-10-CM | POA: Diagnosis not present

## 2021-09-08 DIAGNOSIS — L97818 Non-pressure chronic ulcer of other part of right lower leg with other specified severity: Secondary | ICD-10-CM | POA: Insufficient documentation

## 2021-09-08 DIAGNOSIS — L89623 Pressure ulcer of left heel, stage 3: Secondary | ICD-10-CM | POA: Insufficient documentation

## 2021-09-08 DIAGNOSIS — E11622 Type 2 diabetes mellitus with other skin ulcer: Secondary | ICD-10-CM | POA: Diagnosis not present

## 2021-09-08 DIAGNOSIS — I872 Venous insufficiency (chronic) (peripheral): Secondary | ICD-10-CM | POA: Insufficient documentation

## 2021-09-08 DIAGNOSIS — L97522 Non-pressure chronic ulcer of other part of left foot with fat layer exposed: Secondary | ICD-10-CM | POA: Diagnosis not present

## 2021-09-08 DIAGNOSIS — L97812 Non-pressure chronic ulcer of other part of right lower leg with fat layer exposed: Secondary | ICD-10-CM | POA: Diagnosis not present

## 2021-09-08 NOTE — Progress Notes (Addendum)
Dewalt, Cedarius A. ([id number]) ?Visit Report for 09/08/2021 ?Chief Complaint Document Details ?Patient Name: [name], [name] ?Date of Service: 09/08/2021 1:45 PM ?Medical Record Number: [id number] ?Patient Account Number: [id number] ?Date of Birth/Sex: 04-08-1956 (66 y.o. M) ?Treating RN: Donnamarie Poag ?Primary Care Provider: Fulton Reek Other Clinician: ?Referring Provider: Fulton Reek ?Treating Provider/Extender: Jeri Cos ?Weeks in Treatment: 15 ?Information Obtained from: Patient ?Chief Complaint ?Bilateral LE Ulcers ?Electronic Signature(s) ?Signed: 09/08/2021 2:39:42 PM By: Worthy Keeler PA-C ?Entered By: Worthy Keeler on 09/08/2021 14:39:41 ?Studer, [name] ([id number]) ?-------------------------------------------------------------------------------- ?Debridement Details ?Patient Name: [name], [name] ?Date of Service: 09/08/2021 1:45 PM ?Medical Record Number: [id number] ?Patient Account Number: [id number] ?Date of Birth/Sex: 1955/05/09 (66 y.o. M) ?Treating RN: Donnamarie Poag ?Primary Care Provider: Fulton Reek Other Clinician: ?Referring Provider: Fulton Reek ?Treating Provider/Extender: Jeri Cos ?Weeks in Treatment: 15 ?Debridement Performed for ?Wound #7 Left Calcaneus ?Assessment: ?Performed By: Physician Tommie Sams., PA-C ?Debridement Type: Debridement ?Level of Consciousness (Pre- ?Awake and Alert ?procedure): ?Pre-procedure Verification/Time Out ?Yes - 14:40 ?Taken: ?Start Time: 14:41 ?Pain Control: Lidocaine ?Total Area Debrided (L x W): 3.5 (cm) x 5 (cm) = 17.5 (cm?) ?Tissue and other material ?Viable, Non-Viable, Bone, Slough, Subcutaneous, Slough ?debrided: ?Level: Skin/Subcutaneous Tissue/Muscle/Bone ?Debridement Description: Excisional ?Instrument: Curette ?Bleeding: Large ?Hemostasis Achieved: Pressure ?Response to Treatment: Procedure was tolerated well ?Level of Consciousness (Post- ?Awake and Alert ?procedure): ?Post Debridement Measurements of Total  Wound ?Length: (cm) 3.5 ?Stage: Category/Stage IV ?Width: (cm) 5 ?Depth: (cm) 2.9 ?Volume: (cm?) 39.859 ?Character of Wound/Ulcer Post Debridement: Improved ?Post Procedure Diagnosis ?Same as Pre-procedure ?Electronic Signature(s) ?Signed: 09/08/2021 3:46:47 PM By: Donnamarie Poag ?Signed: 09/08/2021 6:04:02 PM By: Worthy Keeler PA-C ?Entered ByDonnamarie Poag on 09/08/2021 14:46:53 ?Dardis, [name] ([id number]) ?-------------------------------------------------------------------------------- ?HPI Details ?Patient Name: [name], [name] ?Date of Service: 09/08/2021 1:45 PM ?Medical Record Number: [id number] ?Patient Account Number: [id number] ?Date of Birth/Sex: 01-16-1956 (66 y.o. M) ?Treating RN: Donnamarie Poag ?Primary Care Provider: Fulton Reek Other Clinician: ?Referring Provider: Fulton Reek ?Treating Provider/Extender: Jeri Cos ?Weeks in Treatment: 15 ?History of Present Illness ?HPI Description: Admission 8/31 ?[name] is a 66 year old male with a past medical history of uncontrolled insulin-dependent type 2 diabetes, CAD, osteomyelitis to the ?left third toe status post partial third ray amputation, and peripheral arterial disease that presents for a left plantar foot wound and bilateral lower ?extremity wounds. The left plantar foot wound has been present for at least 7 months. He has been following podiatry for this issue. He uses silver ?collagen to this area. He reports that in the past 1 to 2 months he has developed multiple scattered open wounds to his lower extremities ?bilaterally that have worsened over time. He states that they often develop as either blisters or he hits his leg against an object.He is currently ?using Betadine to these areas. He denies signs of infection. He has compression stockings however he cannot put these on very easily. Also he is ?on his feet most of the day. He states he does not have anyone to help him do the activities he needs to get  done. ?Readmission: ?05/26/2021 upon evaluation today patient presents for reevaluation here in the clinic concerning issues he is been having with his bilateral lower ?extremities, his right great toe, and left heel primarily. With that being said he has been in the hospital recently and his hemoglobin A1c on ?January 10 was 9.2. With that being said he does have a lot of really thickened dry skin attached  to his legs when he try to remove that today. He ?is in agreement with the plan. Nonetheless I do believe that based on what we are seeing he is been having a lot of issues with his congestive ?heart failure I do have him on fluid pills he does go to the bathroom quite a bit. Subsequently has been in [employer] but I do not think that ?is doing quite enough to help control his edema. He also needs something better than Santyl to put on the heel right now when he was in the ?hospital they actually cut a space open in the compression around his heel so they can open it up and actually apply the Santyl daily. I think he ?needs something better than that. ?Again the patient does have a history of diabetes mellitus type 2, chronic venous insufficiency, hypertension, and congestive heart failure. He also ?has signs of stage III lymphedema as well. ?06/02/2021 upon evaluation today patient appears to be doing decently well in regard to his wounds. Fortunately I do not see any signs of active ?infection at this time which is great news. Fortunately I think that he is making excellent progress. Nonetheless I do think that we seem to be ?making some progress here. ?06/09/2021 upon evaluation today patient appears to be doing well with regard to his legs all things considered unfortunately the left heel is not ?doing nearly as well as what I like to see. There is a lot of necrotic tissue this is very boggy. I think that it is going to require potentially much ?more debridement but again we really need to get the results of  the arterial testing done and this is actually scheduled for the 15th. ?2/17; arrives in clinic today with the left plantar heel wound completely necrotic and malodorous. Comparing the picture with last week this looks ?quite a bit worse. He has been using Iodoflex under compression. He has an area on the right first toe which seems clean and better and a new ?area on the right anterior mid Tibia. ?I was unable to find the results of his arterial studies that he had done on 1/15. Presumably they have not been read. I can see that they have ?been done ?06/23/2021 upon evaluation today patient appears to be doing well currently in regard to his legs. I am actually very pleased in that regard. ?Fortunately I do not see any signs of active infection at this time. I have reviewed his arterial study and it appeared to be pretty good with TBI's ?on the left of 0.88 on the right 0.87. Overall I think that we are definitely headed in the right direction which is great news. Fortunately I do not ?see any signs of active infection locally or systemically at this time. ?06/30/2021 upon evaluation today patient appears to be doing well with regard to the right lower extremity ulcers with very pleased in this regard. I ?do feel like the Iodoflex has done well here. In regard to the left heel location this is something that I am actually a little bit more concerned about ?I really think that switching over to the Dakin's moistened gauze dressing may be the best way to go at this point based on what we are seeing. ?We have been using Iodoflex is just not clearing up quite as well as I would like to see and I think this may be a better method at which to get ?things under better control. The good news is  he does have his Velcro compression wrap so we can try that at this point as well. ?3/10; patient has chronic venous insufficiency with a wound on the right anterior mid tibia, dorsal right first toe a large plantar left heel  wound. ?We are using Dakin's on the left heel and Iodosorb on the right anterior lower extremity and the right first toe. The areas on the right are ?apparently a lot better. The left heel is certainly a lot better than I remember seeing t

## 2021-09-08 NOTE — Progress Notes (Signed)
Doble, Doris A. ([id number]) ?Visit Report for 09/08/2021 ?Arrival Information Details ?Patient Name: JAYDON, AVINA A. ?Date of Service: 09/08/2021 1:45 PM ?Medical Record Number: [id number] ?Patient Account Number: [id number] ?Date of Birth/Sex: 1955/11/13 (66 y.o. M) ?Treating RN: Donnamarie Poag ?Primary Care Hershall Benkert: Fulton Reek Other Clinician: ?Referring Teniya Filter: Fulton Reek ?Treating Beyla Loney/Extender: Jeri Cos ?Weeks in Treatment: 15 ?Visit Information History Since Last Visit ?Added or deleted any medications: Yes ?Patient Arrived: Knee Scooter ?Had a fall or experienced change in No ?Arrival Time: 14:06 ?activities of daily living that may affect ?Accompanied By: self ?risk of falls: ?Transfer Assistance: None ?Hospitalized since last visit: Yes ?Patient Identification Verified: Yes ?Has Dressing in Place as Prescribed: Yes ?Secondary Verification Process Completed: Yes ?Has Footwear/Offloading in Place as Prescribed: Yes ?Patient Requires Transmission-Based No ?Left: Other: ?Precautions: ?Pain Present Now: Yes ?Patient Has Alerts: Yes ?Patient Alerts: Patient on Blood ?Thinner ?DIABETIC ?Eliquis/Aspirin 81 ?ABI at [facility] 06/14/21 ?L 0.88; R 0.87 ?Electronic Signature(s) ?Signed: 09/08/2021 3:46:47 PM By: Donnamarie Poag ?Entered ByDonnamarie Poag on 09/08/2021 14:20:40 ?Mattia, Esaw A. ([id number]) ?-------------------------------------------------------------------------------- ?Encounter Discharge Information Details ?Patient Name: ODYSSEUS, CADA A. ?Date of Service: 09/08/2021 1:45 PM ?Medical Record Number: [id number] ?Patient Account Number: [id number] ?Date of Birth/Sex: 02/23/1956 (66 y.o. M) ?Treating RN: Donnamarie Poag ?Primary Care Zoriyah Scheidegger: Fulton Reek Other Clinician: ?Referring Vernice Mannina: Fulton Reek ?Treating Novalynn Branaman/Extender: Jeri Cos ?Weeks in Treatment: 15 ?Encounter Discharge Information Items Post Procedure Vitals ?Discharge Condition: Stable ?Temperature (?F):  98.9 ?Ambulatory Status: Knee Scooter ?Pulse (bpm): 86 ?Discharge Destination: Home Health ?Respiratory Rate (breaths/min): 18 ?Telephoned: No ?Blood Pressure (mmHg): 130/69 ?Orders Sent: Yes ?Transportation: Private Auto ?Accompanied By: self ?Schedule Follow-up Appointment: Yes ?Clinical Summary of Care: ?Electronic Signature(s) ?Signed: 09/08/2021 3:46:47 PM By: Donnamarie Poag ?Entered ByDonnamarie Poag on 09/08/2021 14:55:09 ?Memmott, Eunice A. ([id number]) ?-------------------------------------------------------------------------------- ?Lower Extremity Assessment Details ?Patient Name: RONDARIUS, KADRMAS A. ?Date of Service: 09/08/2021 1:45 PM ?Medical Record Number: [id number] ?Patient Account Number: [id number] ?Date of Birth/Sex: Nov 22, 1955 (66 y.o. M) ?Treating RN: Donnamarie Poag ?Primary Care Jaron Czarnecki: Fulton Reek Other Clinician: ?Referring Sible Straley: Fulton Reek ?Treating Aiyanna Awtrey/Extender: Jeri Cos ?Weeks in Treatment: 15 ?Edema Assessment ?Assessed: [Left: Yes] [Right: Yes] ?Edema: [Left: Yes] [Right: Yes] ?Calf ?Left: Right: ?Point of Measurement: 36 cm From Medial Instep 51 cm 52 cm ?Ankle ?Left: Right: ?Point of Measurement: 12 cm From Medial Instep 29 cm 28.5 cm ?Electronic Signature(s) ?Signed: 09/08/2021 3:46:47 PM By: Donnamarie Poag ?Entered ByDonnamarie Poag on 09/08/2021 14:36:16 ?Bjorn, Beatriz A. ([id number]) ?-------------------------------------------------------------------------------- ?Multi Wound Chart Details ?Patient Name: LENNON, BOUTWELL A. ?Date of Service: 09/08/2021 1:45 PM ?Medical Record Number: [id number] ?Patient Account Number: [id number] ?Date of Birth/Sex: 10-Apr-1956 (66 y.o. M) ?Treating RN: Donnamarie Poag ?Primary Care Danielys Madry: Fulton Reek Other Clinician: ?Referring Matty Deamer: Fulton Reek ?Treating Delio Slates/Extender: Jeri Cos ?Weeks in Treatment: 15 ?Vital Signs ?Height(in): 73 ?Pulse(bpm): 86 ?Weight(lbs): 371 Blood Pressure(mmHg): ?Body Mass Index(BMI):  48.9 ?Temperature(??F): 98.9 ?Respiratory Rate(breaths/min): 18 ?Photos: ?Wound Location: Right Lower Leg Right, Proximal Lower Leg Left Calcaneus ?Wounding Event: Gradually Appeared Gradually Appeared Gradually Appeared ?Primary Etiology: Venous Leg Ulcer Venous Leg Ulcer Pressure Ulcer ?Comorbid History: Lymphedema, Asthma, Chronic Lymphedema, Asthma, Chronic Lymphedema, Asthma, Chronic ?Obstructive Pulmonary Disease Obstructive Pulmonary Disease Obstructive Pulmonary Disease ?(COPD), Sleep Apnea, Arrhythmia, (COPD), Sleep Apnea, Arrhythmia, (COPD), Sleep Apnea, Arrhythmia, ?Congestive Heart Failure, Coronary Congestive Heart Failure, Coronary Congestive Heart Failure, Coronary ?Artery Disease, Hypertension, Artery Disease, Hypertension, Artery Disease, Hypertension, ?Myocardial Infarction, Peripheral Myocardial Infarction, Peripheral Myocardial Infarction, Peripheral ?Venous Disease, Colitis, Type II Venous Disease, Colitis, Type  II Venous Disease, Colitis, Type II ?Diabetes, History of pressure Diabetes, History of pressure Diabetes, History of pressure ?wounds, Gout, Osteoarthritis wounds, Gout, Osteoarthritis wounds, Gout, Osteoarthritis ?Date Acquired: 06/16/2021 07/21/2021 05/05/2021 ?Weeks of Treatment: '12 7 15 '$ ?Wound Status: Open Open Open ?Wound Recurrence: No No No ?Pending Amputation on No No Yes ?Presentation: ?Measurements L x W x D (cm) 0.3x0.4x0.1 2.2x1.2x0.1 3.5x5x2.9 ?Area (cm?) : 0.094 2.073 13.744 ?Volume (cm?) : 0.009 0.207 39.859 ?% Reduction in Area: 40.10% -230.10% -141.00% ?% Reduction in Volume: 43.80% -228.60% -599.00% ?Starting Position 1 (o'clock): 10 ?Ending Position 1 (o'clock): 12 ?Maximum Distance 1 (cm): 3 ?Undermining: No No Yes ?Classification: Full Thickness Without Exposed Full Thickness Without Exposed Category/Stage III ?Support Structures Support Structures ?Exudate Amount: Medium Medium Large ?Exudate Type: Serosanguineous Serosanguineous Serosanguineous ?Exudate Color: red,  brown red, brown red, brown ?Wound Margin: [name] [name] Thickened ?Granulation Amount: Medium (34-66%) Large (67-100%) Large (67-100%) ?Granulation Quality: Red, Pale Red, Hyper-granulation Red, Pink ?Necrotic Amount: Medium (34-66%) Small (1-33%) Small (1-33%) ?Exposed Structures: ?Fat Layer (Subcutaneous Tissue): ?Fat Layer (Subcutaneous Tissue): Fat Layer (Subcutaneous Tissue): ?Yes Yes Yes ?Fascia: No ?Fascia: No ?Fascia: No ?Tendon: No ?Tendon: No ?Tendon: No ?Muscle: No ?Muscle: No ?Muscle: No ?Joint: No ?Joint: No ?Joint: No ?Bone: No ?Bone: No ?Bone: No ?Kulish, Ovadia A. ([id number]) ?Epithelialization: Large (67-100%) [name] [name] ?Treatment Notes ?Electronic Signature(s) ?Signed: 09/08/2021 3:46:47 PM By: Donnamarie Poag ?Entered ByDonnamarie Poag on 09/08/2021 14:36:41 ?Granieri, Ronaldo A. ([id number]) ?-------------------------------------------------------------------------------- ?Multi-Disciplinary Care Plan Details ?Patient Name: FINLEE, MILO A. ?Date of Service: 09/08/2021 1:45 PM ?Medical Record Number: [id number] ?Patient Account Number: [id number] ?Date of Birth/Sex: Jul 16, 1955 (66 y.o. M) ?Treating RN: Donnamarie Poag ?Primary Care Shreyas Piatkowski: Fulton Reek Other Clinician: ?Referring Valicia Rief: Fulton Reek ?Treating Aurianna Earlywine/Extender: Jeri Cos ?Weeks in Treatment: 15 ?Active Inactive ?Electronic Signature(s) ?Signed: 09/08/2021 3:46:47 PM By: Donnamarie Poag ?Entered ByDonnamarie Poag on 09/08/2021 14:36:35 ?Gemma, Homer A. ([id number]) ?-------------------------------------------------------------------------------- ?Non-Wound Condition Assessment Details ?Patient Name: TRESHAUN, CARRICO A. ?Date of Service: 09/08/2021 1:45 PM ?Medical Record Number: [id number] ?Patient Account Number: [id number] ?Date of Birth/Sex: 25-Mar-1956 (66 y.o. M) ?Treating RN: Donnamarie Poag ?Primary Care Elihue Ebert: Fulton Reek Other Clinician: ?Referring Isaiyah Feldhaus: Fulton Reek ?Treating Mando Blatz/Extender: Jeri Cos ?Weeks in  Treatment: 15 ?Non-Wound Condition: ?Condition: Lymphedema ?Location: Leg ?Side: Bilateral ?Photos ?Electronic Signature(s) ?Signed: 09/08/2021 3:46:47 PM By: Donnamarie Poag ?Entered ByDonnamarie Poag on 09/08/2021 14:37:15 ?Duffell, Artavius A.

## 2021-09-11 DIAGNOSIS — E1142 Type 2 diabetes mellitus with diabetic polyneuropathy: Secondary | ICD-10-CM | POA: Diagnosis not present

## 2021-09-11 DIAGNOSIS — L97424 Non-pressure chronic ulcer of left heel and midfoot with necrosis of bone: Secondary | ICD-10-CM | POA: Diagnosis not present

## 2021-09-11 DIAGNOSIS — I872 Venous insufficiency (chronic) (peripheral): Secondary | ICD-10-CM | POA: Diagnosis not present

## 2021-09-11 DIAGNOSIS — L03116 Cellulitis of left lower limb: Secondary | ICD-10-CM | POA: Diagnosis not present

## 2021-09-11 DIAGNOSIS — L97912 Non-pressure chronic ulcer of unspecified part of right lower leg with fat layer exposed: Secondary | ICD-10-CM | POA: Diagnosis not present

## 2021-09-12 ENCOUNTER — Encounter (INDEPENDENT_AMBULATORY_CARE_PROVIDER_SITE_OTHER): Payer: Self-pay | Admitting: Nurse Practitioner

## 2021-09-12 ENCOUNTER — Ambulatory Visit: Payer: Medicare Other | Admitting: Family

## 2021-09-12 ENCOUNTER — Ambulatory Visit (INDEPENDENT_AMBULATORY_CARE_PROVIDER_SITE_OTHER): Payer: Medicare Other | Admitting: Nurse Practitioner

## 2021-09-12 VITALS — BP 131/79 | HR 125

## 2021-09-12 DIAGNOSIS — I739 Peripheral vascular disease, unspecified: Secondary | ICD-10-CM | POA: Diagnosis not present

## 2021-09-12 DIAGNOSIS — L97522 Non-pressure chronic ulcer of other part of left foot with fat layer exposed: Secondary | ICD-10-CM

## 2021-09-14 DIAGNOSIS — L03116 Cellulitis of left lower limb: Secondary | ICD-10-CM | POA: Diagnosis not present

## 2021-09-15 ENCOUNTER — Encounter: Payer: Medicare Other | Admitting: Physician Assistant

## 2021-09-15 DIAGNOSIS — L03116 Cellulitis of left lower limb: Secondary | ICD-10-CM | POA: Diagnosis not present

## 2021-09-18 DIAGNOSIS — L03116 Cellulitis of left lower limb: Secondary | ICD-10-CM | POA: Diagnosis not present

## 2021-09-20 ENCOUNTER — Encounter (INDEPENDENT_AMBULATORY_CARE_PROVIDER_SITE_OTHER): Payer: Self-pay | Admitting: Nurse Practitioner

## 2021-09-20 NOTE — Progress Notes (Signed)
Subjective:    Patient ID: [name]., male    DOB: 29-Apr-1956, 66 y.o.   MRN: [id number] No chief complaint on file.   [name] is a 66 year old male well-known to our practice that presented to [facility] after development of left foot cellulitis and worsening of his wound and nonresponsiveness to antibiotics.  There was concern that the patient may have developed osteomyelitis.  MRI of the left ankle was done and noted early osteomyelitis.  On 08/07/2021 the patient was taken for wound debridement and partial calcanectomy.  The patient had a wound VAC placed after intervention.  He is tolerating this well thus far.  Since the patient's discharge he has been healing well and there has been significant granulation and progression of healing with his wound VAC.   Review of Systems  Skin:  Positive for wound.  All other systems reviewed and are negative.     Objective:   Physical Exam Vitals reviewed.  HENT:     Head: Normocephalic.  Cardiovascular:     Rate and Rhythm: Normal rate.  Pulmonary:     Effort: Pulmonary effort is normal.  Skin:    General: Skin is warm and dry.  Neurological:     Mental Status: He is alert and oriented to person, place, and time.     Gait: Gait abnormal.  Psychiatric:        Mood and Affect: Mood normal.        Behavior: Behavior normal.        Thought Content: Thought content normal.        Judgment: Judgment normal.    BP 131/79 (BP Location: Left Arm)   Pulse (!) 125   Past Medical History:  Diagnosis Date   Arrhythmia    atrial fibrillation   Asthma    CHF (congestive heart failure) (HCC)    COPD (chronic obstructive pulmonary disease) (HCC)    Coronary artery disease    Depression    Diabetes mellitus without complication (HCC)    Gout    History anabolic steroid use    Hyperlipidemia    Hypertension    Hypogonadism in male    Morbid obesity (HCC)    Myocardial infarction ([place])     Peripheral vascular disease (HCC)    Perirectal abscess    Pleurisy    Sleep apnea    CPAP at night, no oxygen   Varicella     Social History   Socioeconomic History   Marital status: Married    Spouse name: Not on file   Number of children: Not on file   Years of education: Not on file   Highest education level: Not on file  Occupational History   Not on file  Tobacco Use   Smoking status: Former    Packs/day: 0.50    Years: 45.00    Pack years: 22.50    Types: Cigarettes    Quit date: 04/05/2015    Years since quitting: 6.4   Smokeless tobacco: Never  Vaping Use   Vaping Use: Every day  Substance and Sexual Activity   Alcohol use: Not Currently    Alcohol/week: 3.0 standard drinks    Types: 3 Glasses of wine per week   Drug use: No   Sexual activity: Not on file  Other Topics Concern   Not on file  Social History Narrative   Not on file   Social Determinants of Health   Financial Resource Strain:  Not on file  Food Insecurity: Not on file  Transportation Needs: Not on file  Physical Activity: Not on file  Stress: Not on file  Social Connections: Not on file  Intimate Partner Violence: Not on file    Past Surgical History:  Procedure Laterality Date   ABDOMINAL AORTIC ANEURYSM REPAIR     ACHILLES TENDON SURGERY Left 01/10/2021   Procedure: ACHILLES LENGTHENING/KIDNER;  Surgeon: Caroline More, DPM;  Location: [facility];  Service: Podiatry;  Laterality: Left;   AMPUTATION TOE Right 02/10/2016   Procedure: AMPUTATION TOE 3RD TOE;  Surgeon: Samara Deist, DPM;  Location: [facility];  Service: Podiatry;  Laterality: Right;   AMPUTATION TOE Left 02/24/2020   Procedure: AMPUTATION TOE;  Surgeon: Caroline More, DPM;  Location: [facility];  Service: Podiatry;  Laterality: Left;   APPLICATION OF WOUND VAC Left 02/29/2020   Procedure: APPLICATION OF WOUND VAC;  Surgeon: Caroline More, DPM;  Location: [facility];  Service: Podiatry;  Laterality: Left;   COLONOSCOPY WITH  PROPOFOL [name] 11/18/2015   Procedure: COLONOSCOPY WITH PROPOFOL;  Surgeon: Manya Silvas, MD;  Location: [facility];  Service: Endoscopy;  Laterality: [name];   CORONARY ARTERY BYPASS GRAFT     CORONARY STENT INTERVENTION [name] 02/02/2020   Procedure: CORONARY STENT INTERVENTION;  Surgeon: Isaias Cowman, MD;  Location: [place] [facility];  Service: Cardiovascular;  Laterality: [name];   INCISION AND DRAINAGE Left 08/07/2021   Procedure: INCISION AND DRAINAGE-Partial Calcanectomy;  Surgeon: Caroline More, DPM;  Location: [facility];  Service: Podiatry;  Laterality: Left;   IRRIGATION AND DEBRIDEMENT FOOT Left 02/29/2020   Procedure: IRRIGATION AND DEBRIDEMENT FOOT;  Surgeon: Caroline More, DPM;  Location: [facility];  Service: Podiatry;  Laterality: Left;   IRRIGATION AND DEBRIDEMENT FOOT Left 02/24/2020   Procedure: IRRIGATION AND DEBRIDEMENT FOOT;  Surgeon: Caroline More, DPM;  Location: [facility];  Service: Podiatry;  Laterality: Left;   KNEE ARTHROSCOPY     LEFT HEART CATH AND CORS/GRAFTS ANGIOGRAPHY [name] 02/02/2020   Procedure: LEFT HEART CATH AND CORS/GRAFTS ANGIOGRAPHY;  Surgeon: Teodoro Spray, MD;  Location: [place] [facility];  Service: Cardiovascular;  Laterality: [name];   LOWER EXTREMITY ANGIOGRAPHY Left 02/25/2020   Procedure: Lower Extremity Angiography;  Surgeon: Algernon Huxley, MD;  Location: [place] [facility];  Service: Cardiovascular;  Laterality: Left;   LOWER EXTREMITY ANGIOGRAPHY Left 01/04/2021   Procedure: LOWER EXTREMITY ANGIOGRAPHY;  Surgeon: Algernon Huxley, MD;  Location: [place] [facility];  Service: Cardiovascular;  Laterality: Left;   METATARSAL HEAD EXCISION Left 01/10/2021   Procedure: METATARSAL HEAD EXCISION - LEFT 5th;  Surgeon: Caroline More, DPM;  Location: [facility];  Service: Podiatry;  Laterality: Left;   PERIPHERAL VASCULAR CATHETERIZATION Right 01/24/2016   Procedure: Lower Extremity Angiography;  Surgeon: Katha Cabal, MD;  Location: [place] [facility];   Service: Cardiovascular;  Laterality: Right;   PERIPHERAL VASCULAR CATHETERIZATION Right 01/25/2016   Procedure: Lower Extremity Angiography;  Surgeon: Katha Cabal, MD;  Location: [place] [facility];  Service: Cardiovascular;  Laterality: Right;   TOE AMPUTATION     TONSILLECTOMY      Family History  Problem Relation Age of Onset   Hypertension Father    Coronary artery disease Father    Alcohol abuse Father    Heart failure Brother     Allergies  Allergen Reactions   Penicillins Other (See Comments)    Happened at 66 years old and pt. stated he passed out  He has  tolerated amoxicillin/clavulanate and ampicillin/sulbactam   Statins     Other reaction(s): Muscle Pain Causes legs to ache per pt       Latest Ref Rng & Units 08/08/2021    4:39 PM 08/04/2021    3:39 PM 05/15/2021    5:20 AM  CBC  WBC 4.0 - 10.5 K/uL 7.4   10.5   6.3    Hemoglobin 13.0 - 17.0 g/dL 10.2   10.8   10.5    Hematocrit 39.0 - 52.0 % 34.6   35.7   33.7    Platelets 150 - 400 K/uL 209   203   262        CMP     Component Value Date/Time   NA 135 08/08/2021 1639   NA 141 07/12/2011 0919   K 5.0 08/08/2021 1639   K 4.4 07/12/2011 0919   CL 101 08/08/2021 1639   CL 102 07/12/2011 0919   CO2 26 08/08/2021 1639   CO2 29 07/12/2011 0919   GLUCOSE 249 (H) 08/08/2021 1639   GLUCOSE 76 07/12/2011 0919   BUN 49 (H) 08/08/2021 1639   BUN 16 05/05/2013 1022   CREATININE 1.04 08/11/2021 0515   CREATININE 0.71 05/05/2013 1022   CALCIUM 8.3 (L) 08/08/2021 1639   CALCIUM 8.8 07/12/2011 0919   PROT 7.0 08/08/2021 1639   ALBUMIN 2.6 (L) 08/08/2021 1639   AST 35 08/08/2021 1639   ALT 38 08/08/2021 1639   ALKPHOS 246 (H) 08/08/2021 1639   BILITOT 0.7 08/08/2021 1639   GFRNONAA >60 08/11/2021 0515   GFRNONAA >60 05/05/2013 1022   GFRAA >60 01/06/2020 1853   GFRAA >60 05/05/2013 1022     No results found.     Assessment & Plan:   1. PAD (peripheral artery disease) ([place]) The patient's wound  is healing.  His most recent studies done in the hospital should he should have adequate perfusion for wound healing.  Based upon the pictures and office note it appears that the wound is progressing well.  We will have the patient return in 2 months with noninvasive studies and continue to follow.  2. Foot ulcer with fat layer exposed, left ([place]) Photos of the wound show that it is healed considerably with wound VAC therapy.  Patient is advised to continue to follow with his podiatrist as well as the wound center for continued management and treatment of the wound.   Current Outpatient Medications on File Prior to Visit  Medication Sig Dispense Refill   amiodarone (PACERONE) 200 MG tablet Take 200 mg by mouth daily.     amoxicillin-clavulanate (AUGMENTIN) 875-125 MG tablet Take 1 tablet by mouth 2 (two) times daily. 60 tablet 0   apixaban (ELIQUIS) 5 MG TABS tablet Take 1 tablet (5 mg total) by mouth 2 (two) times daily. 60 tablet 5   aspirin EC 81 MG EC tablet Take 1 tablet (81 mg total) by mouth daily. Swallow whole. 30 tablet 0   Cholecalciferol 25 MCG (1000 UT) tablet Take 1,000 Units by mouth daily.     Continuous Blood Gluc Sensor (FREESTYLE LIBRE 2 SENSOR) MISC      Cyanocobalamin (VITAMIN B-12) 5000 MCG TBDP Take 10,000 mcg by mouth daily.     diltiazem (CARDIZEM CD) 300 MG 24 hr capsule Take 1 capsule (300 mg total) by mouth daily.     ezetimibe (ZETIA) 10 MG tablet Take 10 mg by mouth at bedtime.     ferrous sulfate 325 (65 FE) MG tablet  Take 325 mg by mouth daily with breakfast.     furosemide (LASIX) 40 MG tablet Take 40 mg by mouth daily. (Take with '20mg'$  tablet to equal '60mg'$  total)     gabapentin (NEURONTIN) 300 MG capsule Take 300 mg by mouth at bedtime.     HUMALOG 100 UNIT/ML injection Inject into the skin.     insulin glargine (LANTUS) 100 UNIT/ML Solostar Pen Inject 25 Units into the skin in the morning and at bedtime. 15 mL 11   isosorbide mononitrate (IMDUR) 60 MG 24 hr  tablet Take 60 mg by mouth 2 (two) times daily.     losartan (COZAAR) 50 MG tablet Take 50 mg by mouth daily.     metFORMIN (GLUCOPHAGE) 1000 MG tablet Take 1,000 mg by mouth 2 (two) times daily.      metoprolol tartrate (LOPRESSOR) 50 MG tablet Take 100 mg by mouth 2 (two) times daily.     Multiple Vitamins-Minerals (MULTIVITAMIN WITH MINERALS) tablet Take 1 tablet by mouth daily.     nitroGLYCERIN (NITROSTAT) 0.4 MG SL tablet Place 0.4 mg under the tongue every 5 (five) minutes as needed for chest pain.     pramipexole (MIRAPEX) 1 MG tablet Take 2 mg by mouth at bedtime.     primidone (MYSOLINE) 250 MG tablet Take 250 mg by mouth 2 (two) times daily.     tamsulosin (FLOMAX) 0.4 MG CAPS capsule Take 0.4 mg by mouth daily.     traMADol (ULTRAM) 50 MG tablet Take 1 tablet (50 mg total) by mouth daily as needed for moderate pain. 10 tablet 0   vitamin C (ASCORBIC ACID) 500 MG tablet Take 500 mg by mouth daily.     zinc gluconate 50 MG tablet Take 50 mg by mouth daily.     No current facility-administered medications on file prior to visit.    There are no Patient Instructions on file for this visit. No follow-ups on file.   Kris Hartmann, NP

## 2021-09-22 ENCOUNTER — Encounter: Payer: Medicare Other | Admitting: Physician Assistant

## 2021-09-22 DIAGNOSIS — I10 Essential (primary) hypertension: Secondary | ICD-10-CM | POA: Diagnosis not present

## 2021-09-22 DIAGNOSIS — E119 Type 2 diabetes mellitus without complications: Secondary | ICD-10-CM | POA: Diagnosis not present

## 2021-09-22 DIAGNOSIS — I5042 Chronic combined systolic (congestive) and diastolic (congestive) heart failure: Secondary | ICD-10-CM | POA: Diagnosis not present

## 2021-09-22 DIAGNOSIS — L97426 Non-pressure chronic ulcer of left heel and midfoot with bone involvement without evidence of necrosis: Secondary | ICD-10-CM | POA: Diagnosis not present

## 2021-09-22 DIAGNOSIS — L97522 Non-pressure chronic ulcer of other part of left foot with fat layer exposed: Secondary | ICD-10-CM | POA: Diagnosis not present

## 2021-09-22 DIAGNOSIS — L97818 Non-pressure chronic ulcer of other part of right lower leg with other specified severity: Secondary | ICD-10-CM | POA: Diagnosis not present

## 2021-09-22 DIAGNOSIS — L89893 Pressure ulcer of other site, stage 3: Secondary | ICD-10-CM | POA: Diagnosis not present

## 2021-09-22 DIAGNOSIS — E11621 Type 2 diabetes mellitus with foot ulcer: Secondary | ICD-10-CM | POA: Diagnosis not present

## 2021-09-22 DIAGNOSIS — L89623 Pressure ulcer of left heel, stage 3: Secondary | ICD-10-CM | POA: Diagnosis not present

## 2021-09-22 DIAGNOSIS — I872 Venous insufficiency (chronic) (peripheral): Secondary | ICD-10-CM | POA: Diagnosis not present

## 2021-09-22 DIAGNOSIS — I110 Hypertensive heart disease with heart failure: Secondary | ICD-10-CM | POA: Diagnosis not present

## 2021-09-22 DIAGNOSIS — L97812 Non-pressure chronic ulcer of other part of right lower leg with fat layer exposed: Secondary | ICD-10-CM | POA: Diagnosis not present

## 2021-09-22 NOTE — Progress Notes (Addendum)
MACRAE, WIEGMAN ([id number]) Visit Report for 09/22/2021 Arrival Information Details Patient Name: [name], [name] Date of Service: 09/22/2021 1:45 PM Medical Record Number: [id number] Patient Account Number: [id number] Date of Birth/Sex: 1956/04/03 (66 y.o. M) Treating RN: Levora Dredge Primary Care March Steyer: Fulton Reek Other Clinician: Referring Yoshi Mancillas: Fulton Reek Treating Angelic Schnelle/Extender: Skipper Cliche in Treatment: 82 Visit Information History Since Last Visit Added or deleted any medications: Yes Patient Arrived: Knee Scooter Any new allergies or adverse reactions: No Arrival Time: 14:28 Had a fall or experienced change in No Accompanied By: self activities of daily living that may affect Transfer Assistance: None risk of falls: Patient Identification Verified: Yes Hospitalized since last visit: No Secondary Verification Process Completed: Yes Has Dressing in Place as Prescribed: Yes Patient Requires Transmission-Based No Pain Present Now: Yes Precautions: Patient Has Alerts: Yes Patient Alerts: Patient on Blood Thinner DIABETIC Eliquis/Aspirin 81 ABI at [facility] 06/14/21 L 0.88; R 0.87 Electronic Signature(s) Signed: 09/22/2021 4:31:40 PM By: Levora Dredge Entered By: Levora Dredge on 09/22/2021 14:29:21 Taber, Nanine Means ([id number]) -------------------------------------------------------------------------------- Clinic Level of Care Assessment Details Patient Name: [name], [name] Date of Service: 09/22/2021 1:45 PM Medical Record Number: [id number] Patient Account Number: [id number] Date of Birth/Sex: June 24, 1955 (66 y.o. M) Treating RN: Levora Dredge Primary Care Sherlin Sonier: Fulton Reek Other Clinician: Referring Alzena Gerber: Fulton Reek Treating Nehemie Casserly/Extender: Skipper Cliche in Treatment: 17 Clinic Level of Care Assessment Items TOOL 4 Quantity Score '[]'$  - Use when only an EandM is performed on FOLLOW-UP visit  0 ASSESSMENTS - Nursing Assessment / Reassessment X - Reassessment of Co-morbidities (includes updates in patient status) 1 10 '[]'$  - 0 Reassessment of Adherence to Treatment Plan ASSESSMENTS - Wound and Skin Assessment / Reassessment '[]'$  - Simple Wound Assessment / Reassessment - one wound 0 X- 2 5 Complex Wound Assessment / Reassessment - multiple wounds '[]'$  - 0 Dermatologic / Skin Assessment (not related to wound area) ASSESSMENTS - Focused Assessment X - Circumferential Edema Measurements - multi extremities 1 5 '[]'$  - 0 Nutritional Assessment / Counseling / Intervention X- 1 5 Lower Extremity Assessment (monofilament, tuning fork, pulses) '[]'$  - 0 Peripheral Arterial Disease Assessment (using hand held doppler) ASSESSMENTS - Ostomy and/or Continence Assessment and Care '[]'$  - Incontinence Assessment and Management 0 '[]'$  - 0 Ostomy Care Assessment and Management (repouching, etc.) PROCESS - Coordination of Care X - Simple Patient / Family Education for ongoing care 1 15 '[]'$  - 0 Complex (extensive) Patient / Family Education for ongoing care '[]'$  - 0 Staff obtains Programmer, systems, Records, Test Results / Process Orders '[]'$  - 0 Staff telephones HHA, Nursing Homes / Clarify orders / etc '[]'$  - 0 Routine Transfer to another Facility (non-emergent condition) '[]'$  - 0 Routine Hospital Admission (non-emergent condition) '[]'$  - 0 New Admissions / Biomedical engineer / Ordering NPWT, Apligraf, etc. '[]'$  - 0 Emergency Hospital Admission (emergent condition) X- 1 10 Simple Discharge Coordination '[]'$  - 0 Complex (extensive) Discharge Coordination PROCESS - Special Needs '[]'$  - Pediatric / Minor Patient Management 0 '[]'$  - 0 Isolation Patient Management '[]'$  - 0 Hearing / Language / Visual special needs '[]'$  - 0 Assessment of Community assistance (transportation, D/C planning, etc.) '[]'$  - 0 Additional assistance / Altered mentation '[]'$  - 0 Support Surface(s) Assessment (bed, cushion, seat,  etc.) INTERVENTIONS - Wound Cleansing / Measurement Toren, Thaddus A. ([id number]) '[]'$  - 0 Simple Wound Cleansing - one wound X- 2 5 Complex Wound Cleansing - multiple wounds X- 1 5 Wound Imaging (photographs - any number  of wounds) '[]'$  - 0 Wound Tracing (instead of photographs) '[]'$  - 0 Simple Wound Measurement - one wound X- 2 5 Complex Wound Measurement - multiple wounds INTERVENTIONS - Wound Dressings X - Small Wound Dressing one or multiple wounds 2 10 '[]'$  - 0 Medium Wound Dressing one or multiple wounds '[]'$  - 0 Large Wound Dressing one or multiple wounds '[]'$  - 0 Application of Medications - topical '[]'$  - 0 Application of Medications - injection INTERVENTIONS - Miscellaneous '[]'$  - External ear exam 0 '[]'$  - 0 Specimen Collection (cultures, biopsies, blood, body fluids, etc.) '[]'$  - 0 Specimen(s) / Culture(s) sent or taken to Lab for analysis '[]'$  - 0 Patient Transfer (multiple staff / Civil Service fast streamer / Similar devices) '[]'$  - 0 Simple Staple / Suture removal (25 or less) '[]'$  - 0 Complex Staple / Suture removal (26 or more) '[]'$  - 0 Hypo / Hyperglycemic Management (close monitor of Blood Glucose) '[]'$  - 0 Ankle / Brachial Index (ABI) - do not check if billed separately X- 1 5 Vital Signs Has the patient been seen at the hospital within the last three years: Yes Total Score: 105 Level Of Care: New/Established - Level 3 Electronic Signature(s) Signed: 09/22/2021 4:31:40 PM By: Levora Dredge Entered By: Levora Dredge on 09/22/2021 16:23:15 Brew, Nanine Means ([id number]) -------------------------------------------------------------------------------- Encounter Discharge Information Details Patient Name: Alec Schooner A. Date of Service: 09/22/2021 1:45 PM Medical Record Number: [id number] Patient Account Number: [id number] Date of Birth/Sex: 05/09/1955 (66 y.o. M) Treating RN: Levora Dredge Primary Care Brenyn Petrey: Fulton Reek Other Clinician: Referring Marquette Blodgett: Fulton Reek Treating Graviel Payeur/Extender: Skipper Cliche in Treatment: 17 Encounter Discharge Information Items Discharge Condition: Stable Ambulatory Status: Knee Scooter Discharge Destination: Home Transportation: Private Auto Accompanied By: self Schedule Follow-up Appointment: Yes Clinical Summary of Care: Electronic Signature(s) Signed: 09/22/2021 4:24:05 PM By: Levora Dredge Entered By: Levora Dredge on 09/22/2021 16:24:05 Knab, Nanine Means ([id number]) -------------------------------------------------------------------------------- Lower Extremity Assessment Details Patient Name: [name], [name] Date of Service: 09/22/2021 1:45 PM Medical Record Number: [id number] Patient Account Number: [id number] Date of Birth/Sex: [birth year] (66 y.o. M) Treating RN: Levora Dredge Primary Care Bessie Boyte: Fulton Reek Other Clinician: Referring Melburn Treiber: Fulton Reek Treating Quantavious Eggert/Extender: Jeri Cos Weeks in Treatment: 17 Edema Assessment Assessed: [Left: No] [Right: No] Edema: [Left: Yes] [Right: Yes] Calf Left: Right: Point of Measurement: 36 cm From Medial Instep 54.5 cm 51 cm Ankle Left: Right: Point of Measurement: 12 cm From Medial Instep 32.3 cm 29.5 cm Electronic Signature(s) Signed: 09/22/2021 4:31:40 PM By: Levora Dredge Entered By: Levora Dredge on 09/22/2021 14:49:58 Dipierro, Nanine Means ([id number]) -------------------------------------------------------------------------------- Multi Wound Chart Details Patient Name: [name], [name] Date of Service: 09/22/2021 1:45 PM Medical Record Number: [id number] Patient Account Number: [id number] Date of Birth/Sex: 02-08-1956 (66 y.o. M) Treating RN: Levora Dredge Primary Care Jaton Eilers: Fulton Reek Other Clinician: Referring Delshon Blanchfield: Fulton Reek Treating Catlyn Shipton/Extender: Skipper Cliche in Treatment: 17 Vital Signs Height(in): 2 Pulse(bpm): 119 Weight(lbs): 371 Blood Pressure(mmHg):  140/95 Body Mass Index(BMI): 48.9 Temperature(F): 97.5 Respiratory Rate(breaths/min): 18 Photos: Wound Location: Right Lower Leg Right, Proximal Lower Leg Left Calcaneus Wounding Event: Gradually Appeared Gradually Appeared Gradually Appeared Primary Etiology: Venous Leg Ulcer Venous Leg Ulcer Pressure Ulcer Comorbid History: Lymphedema, Asthma, Chronic Lymphedema, Asthma, Chronic Lymphedema, Asthma, Chronic Obstructive Pulmonary Disease Obstructive Pulmonary Disease Obstructive Pulmonary Disease (COPD), Sleep Apnea, Arrhythmia, (COPD), Sleep Apnea, Arrhythmia, (COPD), Sleep Apnea, Arrhythmia, Congestive Heart Failure, Coronary Congestive Heart Failure, Coronary Congestive Heart Failure, Coronary Artery Disease, Hypertension, Artery Disease, Hypertension, Artery Disease, Hypertension,  Myocardial Infarction, Peripheral Myocardial Infarction, Peripheral Myocardial Infarction, Peripheral Venous Disease, Colitis, Type II Venous Disease, Colitis, Type II Venous Disease, Colitis, Type II Diabetes, History of pressure Diabetes, History of pressure Diabetes, History of pressure wounds, Gout, Osteoarthritis wounds, Gout, Osteoarthritis wounds, Gout, Osteoarthritis Date Acquired: 06/16/2021 07/21/2021 05/05/2021 Weeks of Treatment: '14 9 17 '$ Wound Status: Open Open Open Wound Recurrence: No No No Pending Amputation on No No Yes Presentation: Measurements L x W x D (cm) 0.1x0.1x0.1 1.7x0.7x0.1 3.5x5.7x2.5 Area (cm) : 0.008 0.935 15.669 Volume (cm) : 0.001 0.093 39.172 % Reduction in Area: 94.90% -48.90% -174.80% % Reduction in Volume: 93.80% -47.60% -587.00% Position 1 (o'clock): 12 Maximum Distance 1 (cm): 3 Tunneling: No No Yes Classification: Full Thickness Without Exposed Full Thickness Without Exposed Category/Stage IV Support Structures Support Structures Exudate Amount: None Present Medium Large Exudate Type: [name] Serosanguineous Serosanguineous Exudate Color: [name] red, brown red,  brown Wound Margin: [name] [name] Thickened Granulation Amount: None Present (0%) Medium (34-66%) Large (67-100%) Granulation Quality: [name] Red, Hyper-granulation Red, Pink Necrotic Amount: Large (67-100%) Medium (34-66%) Small (1-33%) Exposed Structures: Fascia: No Fat Layer (Subcutaneous Tissue): Fat Layer (Subcutaneous Tissue): Fat Layer (Subcutaneous Tissue): Yes Yes No Fascia: No Bone: Yes Tendon: No Tendon: No Fascia: No Muscle: No Muscle: No Tendon: No Joint: No Joint: No Muscle: No Bone: No Bone: No Joint: No Epithelialization: Large (67-100%) None None Labrie, Dhillon A. ([id number]) Treatment Notes Electronic Signature(s) Signed: 09/22/2021 4:31:40 PM By: Levora Dredge Entered By: Levora Dredge on 09/22/2021 14:57:40 Milhouse, Nanine Means ([id number]) -------------------------------------------------------------------------------- Multi-Disciplinary Care Plan Details Patient Name: Alec Schooner A. Date of Service: 09/22/2021 1:45 PM Medical Record Number: [id number] Patient Account Number: [id number] Date of Birth/Sex: 04/15/1956 (66 y.o. M) Treating RN: Levora Dredge Primary Care Ula Couvillon: Fulton Reek Other Clinician: Referring Justeen Hehr: Fulton Reek Treating Kreig Parson/Extender: Skipper Cliche in Treatment: 17 Active Inactive Electronic Signature(s) Signed: 09/22/2021 4:31:40 PM By: Levora Dredge Entered By: Levora Dredge on 09/22/2021 14:57:31 Cortina, Nanine Means ([id number]) -------------------------------------------------------------------------------- Pain Assessment Details Patient Name: [name], [name] Date of Service: 09/22/2021 1:45 PM Medical Record Number: [id number] Patient Account Number: [id number] Date of Birth/Sex: 1956-02-19 (66 y.o. M) Treating RN: Levora Dredge Primary Care Oneisha Ammons: Fulton Reek Other Clinician: Referring Telma Pyeatt: Fulton Reek Treating Adella Manolis/Extender: Skipper Cliche in Treatment: 17 Active  Problems Location of Pain Severity and Description of Pain Patient Has Paino Yes Site Locations Rate the pain. Current Pain Level: 5 Pain Management and Medication Current Pain Management: Notes PT STATES PAIN IN LEFT HEEL Electronic Signature(s) Signed: 09/22/2021 4:31:40 PM By: Levora Dredge Entered By: Levora Dredge on 09/22/2021 14:33:17 Foerster, Nanine Means ([id number]) -------------------------------------------------------------------------------- Patient/Caregiver Education Details Patient Name: Alec Schooner A. Date of Service: 09/22/2021 1:45 PM Medical Record Number: [id number] Patient Account Number: [id number] Date of Birth/Gender: [birth year] (65 y.o. M) Treating RN: Levora Dredge Primary Care Physician: Fulton Reek Other Clinician: Referring Physician: Fulton Reek Treating Physician/Extender: Skipper Cliche in Treatment: 56 Education Assessment Education Provided To: Patient Education Topics Provided Wound/Skin Impairment: Handouts: Caring for Your Ulcer Methods: Explain/Verbal Responses: State content correctly Electronic Signature(s) Signed: 09/22/2021 4:31:40 PM By: Levora Dredge Entered By: Levora Dredge on 09/22/2021 16:23:32 Diop, Nanine Means ([id number]) -------------------------------------------------------------------------------- Wound Assessment Details Patient Name: [name], [name] Date of Service: 09/22/2021 1:45 PM Medical Record Number: [id number] Patient Account Number: [id number] Date of Birth/Sex: January 07, 1956 (66 y.o. M) Treating RN: Levora Dredge Primary Care Tait Balistreri: Fulton Reek Other Clinician: Referring Ledon Weihe: Fulton Reek Treating Bert Givans/Extender: Skipper Cliche in Treatment: 17 Wound Status  Wound Number: 11 Primary Venous Leg Ulcer Etiology: Wound Location: Right Lower Leg Wound Healed - Epithelialized Wounding Event: Gradually Appeared Status: Date Acquired: 06/16/2021 Comorbid  Lymphedema, Asthma, Chronic Obstructive Pulmonary Weeks Of Treatment: 14 History: Disease (COPD), Sleep Apnea, Arrhythmia, Congestive Clustered Wound: No Heart Failure, Coronary Artery Disease, Hypertension, Myocardial Infarction, Peripheral Venous Disease, Colitis, Type II Diabetes, History of pressure wounds, Gout, Osteoarthritis Photos Wound Measurements Length: (cm) 0 Width: (cm) 0 Depth: (cm) 0 Area: (cm) 0 Volume: (cm) 0 % Reduction in Area: 100% % Reduction in Volume: 100% Epithelialization: Large (67-100%) Tunneling: No Undermining: No Wound Description Classification: Full Thickness Without Exposed Support Structure Exudate Amount: None Present s Foul Odor After Cleansing: No Slough/Fibrino Yes Wound Bed Granulation Amount: None Present (0%) Exposed Structure Necrotic Amount: Large (67-100%) Fascia Exposed: No Fat Layer (Subcutaneous Tissue) Exposed: No Tendon Exposed: No Muscle Exposed: No Joint Exposed: No Bone Exposed: No Treatment Notes Wound #11 (Lower Leg) Wound Laterality: Right Cleanser Peri-Wound Care Topical Rebert, Luay AMarland Kitchen ([id number]) Primary Dressing Secondary Dressing Secured With Compression Wrap Compression Stockings Add-Ons Electronic Signature(s) Signed: 09/22/2021 4:31:40 PM By: Levora Dredge Entered By: Levora Dredge on 09/22/2021 15:03:48 Przybysz, Nanine Means ([id number]) -------------------------------------------------------------------------------- Wound Assessment Details Patient Name: [name], [name] Date of Service: 09/22/2021 1:45 PM Medical Record Number: [id number] Patient Account Number: [id number] Date of Birth/Sex: 04-Sep-1955 (66 y.o. M) Treating RN: Levora Dredge Primary Care Meilani Edmundson: Fulton Reek Other Clinician: Referring Christoph Copelan: Fulton Reek Treating Charmelle Soh/Extender: Skipper Cliche in Treatment: 17 Wound Status Wound Number: 12 Primary Venous Leg Ulcer Etiology: Wound Location: Right,  Proximal Lower Leg Wound Open Wounding Event: Gradually Appeared Status: Date Acquired: 07/21/2021 Comorbid Lymphedema, Asthma, Chronic Obstructive Pulmonary Weeks Of Treatment: 9 History: Disease (COPD), Sleep Apnea, Arrhythmia, Congestive Clustered Wound: No Heart Failure, Coronary Artery Disease, Hypertension, Myocardial Infarction, Peripheral Venous Disease, Colitis, Type II Diabetes, History of pressure wounds, Gout, Osteoarthritis Photos Wound Measurements Length: (cm) 1.7 Width: (cm) 0.7 Depth: (cm) 0.1 Area: (cm) 0.935 Volume: (cm) 0.093 % Reduction in Area: -48.9% % Reduction in Volume: -47.6% Epithelialization: None Tunneling: No Undermining: No Wound Description Classification: Full Thickness Without Exposed Support Structu Exudate Amount: Medium Exudate Type: Serosanguineous Exudate Color: red, brown res Foul Odor After Cleansing: No Slough/Fibrino Yes Wound Bed Granulation Amount: Medium (34-66%) Exposed Structure Granulation Quality: Red, Hyper-granulation Fascia Exposed: No Necrotic Amount: Medium (34-66%) Fat Layer (Subcutaneous Tissue) Exposed: Yes Necrotic Quality: Adherent Slough Tendon Exposed: No Muscle Exposed: No Joint Exposed: No Bone Exposed: No Treatment Notes Wound #12 (Lower Leg) Wound Laterality: Right, Proximal Cleanser Normal Saline Dundon, Harish A. ([id number]) Discharge Instruction: Wash your hands with soap and water. Remove old dressing, discard into plastic bag and place into trash. Cleanse the wound with Normal Saline prior to applying a clean dressing using gauze sponges, not tissues or cotton balls. Do not scrub or use excessive force. Pat dry using gauze sponges, not tissue or cotton balls. Soap and Water Discharge Instruction: Gently cleanse wound with antibacterial soap, rinse and pat dry prior to dressing wounds Wound Cleanser Discharge Instruction: Wash your hands with soap and water. Remove old dressing, discard  into plastic bag and place into trash. Cleanse the wound with Wound Cleanser prior to applying a clean dressing using gauze sponges, not tissues or cotton balls. Do not scrub or use excessive force. Pat dry using gauze sponges, not tissue or cotton balls. Peri-Wound Care Topical Primary Dressing Prisma 4.34 (in) Discharge Instruction: Moisten w/normal saline or sterile water; Cover wound as  directed. Do not remove from wound bed. Secondary Dressing ABD Pad 5x9 (in/in) Discharge Instruction: Cover with ABD pad Kerlix 4.5 x 4.1 (in/yd) Discharge Instruction: Apply Kerlix 4.5 x 4.1 (in/yd) as instructed Secured With Medipore Tape - 91M Medipore H Soft Cloth Surgical Tape, 2x2 (in/yd) Tubigrip Size F, 4x10 (in/yd) Discharge Instruction: Apply Tubigrip F 3 finger-widths below knee to base of toes to secure dressing and/or for swelling. Compression Wrap Compression Stockings Add-Ons Electronic Signature(s) Signed: 09/22/2021 4:31:40 PM By: Levora Dredge Entered By: Levora Dredge on 09/22/2021 14:51:23 Hernon, Nanine Means ([id number]) -------------------------------------------------------------------------------- Wound Assessment Details Patient Name: Recktenwald, Brittney A. Date of Service: 09/22/2021 1:45 PM Medical Record Number: [id number] Patient Account Number: [id number] Date of Birth/Sex: 02-21-1956 (66 y.o. M) Treating RN: Levora Dredge Primary Care Chattie Greeson: Fulton Reek Other Clinician: Referring Terrill Alperin: Fulton Reek Treating Milyn Stapleton/Extender: Skipper Cliche in Treatment: 17 Wound Status Wound Number: 7 Primary Pressure Ulcer Etiology: Wound Location: Left Calcaneus Wound Open Wounding Event: Gradually Appeared Status: Date Acquired: 05/05/2021 Comorbid Lymphedema, Asthma, Chronic Obstructive Pulmonary Weeks Of Treatment: 17 History: Disease (COPD), Sleep Apnea, Arrhythmia, Congestive Clustered Wound: No Heart Failure, Coronary Artery Disease,  Hypertension, Pending Amputation On Presentation Myocardial Infarction, Peripheral Venous Disease, Colitis, Type II Diabetes, History of pressure wounds, Gout, Osteoarthritis Photos Wound Measurements Length: (cm) 3.5 % Reducti Width: (cm) 5.7 % Reducti Depth: (cm) 2.5 Epithelia Area: (cm) 15.669 Tunnelin Volume: (cm) 39.172 Posit Maximu on in Area: -174.8% on in Volume: -587% lization: None g: Yes ion (o'clock): 12 m Distance: (cm) 3 Undermining: No Wound Description Classification: Category/Stage IV Foul Odo Wound Margin: Thickened Slough/F Exudate Amount: Large Exudate Type: Serosanguineous Exudate Color: red, brown r After Cleansing: No ibrino Yes Wound Bed Granulation Amount: Large (67-100%) Exposed Structure Granulation Quality: Red, Pink Fascia Exposed: No Necrotic Amount: Small (1-33%) Fat Layer (Subcutaneous Tissue) Exposed: Yes Necrotic Quality: Adherent Slough Tendon Exposed: No Muscle Exposed: No Joint Exposed: No Bone Exposed: Yes Treatment Notes Wound #7 (Calcaneus) Wound Laterality: Left Sponsel, Onix A. ([id number]) Cleanser Dakin 16 (oz) 0.25 Discharge Instruction: Use as directed. Normal Saline Discharge Instruction: Wash your hands with soap and water. Remove old dressing, discard into plastic bag and place into trash. Cleanse the wound with Normal Saline prior to applying a clean dressing using gauze sponges, not tissues or cotton balls. Do not scrub or use excessive force. Pat dry using gauze sponges, not tissue or cotton balls. Soap and Water Discharge Instruction: Gently cleanse wound with antibacterial soap, rinse and pat dry prior to dressing wounds Wound Cleanser Discharge Instruction: Wash your hands with soap and water. Remove old dressing, discard into plastic bag and place into trash. Cleanse the wound with Wound Cleanser prior to applying a clean dressing using gauze sponges, not tissues or cotton balls. Do not scrub or use  excessive force. Pat dry using gauze sponges, not tissue or cotton balls. Peri-Wound Care Topical Primary Dressing Gauze Discharge Instruction: Dakins lightly soaked gauze Secondary Dressing ABD Pad 5x9 (in/in) Discharge Instruction: Cover with ABD pad Secured With Medipore Tape - 91M Medipore H Soft Cloth Surgical Tape, 2x2 (in/yd) Kerlix Roll Sterile or Non-Sterile 6-ply 4.5x4 (yd/yd) Discharge Instruction: Apply Kerlix as directed Tubigrip Size F, 4x10 (in/yd) Discharge Instruction: Apply Tubigrip F 3 finger-widths below knee to base of toes to secure dressing and/or for swelling. Compression Wrap Left leg JUXTELITE WITH TUBI GRIP UNDER Compression Stockings Circaid Juxta Lite Compression Wrap Quantity: 1 Left Leg Compression Amount: 30-40 mmHg Discharge Instruction: Apply Circaid Juxta Lite  Compression Wrap as directed Add-Ons Electronic Signature(s) Signed: 09/22/2021 4:31:40 PM By: Levora Dredge Entered By: Levora Dredge on 09/22/2021 14:52:11 Cristobal, Nanine Means ([id number]) -------------------------------------------------------------------------------- Vitals Details Patient Name: Alec Schooner A. Date of Service: 09/22/2021 1:45 PM Medical Record Number: [id number] Patient Account Number: [id number] Date of Birth/Sex: 05-02-1955 (66 y.o. M) Treating RN: Levora Dredge Primary Care Daron Breeding: Fulton Reek Other Clinician: Referring Makahla Kiser: Fulton Reek Treating Armin Yerger/Extender: Skipper Cliche in Treatment: 17 Vital Signs Time Taken: 14:29 Temperature (F): 97.5 Height (in): 73 Pulse (bpm): 119 Weight (lbs): 371 Respiratory Rate (breaths/min): 18 Body Mass Index (BMI): 48.9 Blood Pressure (mmHg): 140/95 Reference Range: 80 - 120 mg / dl Airway Pulse Oximetry (%): 95 Notes PT STATES PULSE RUNS HIGH, PT DENIES ANY CHEST PAIN Electronic Signature(s) Signed: 09/22/2021 4:31:40 PM By: Levora Dredge Entered By: Levora Dredge on 09/22/2021  14:32:59

## 2021-09-22 NOTE — Progress Notes (Addendum)
Alec, ETTINGER ([id number]) Visit Report for 09/22/2021 Chief Complaint Document Details Patient Name: [name], [name] Date of Service: 09/22/2021 1:45 PM Medical Record Number: [id number] Patient Account Number: [id number] Date of Birth/Sex: 1956-01-18 (66 y.o. M) Treating RN: Levora Dredge Primary Care Provider: Fulton Reek Other Clinician: Referring Provider: Fulton Reek Treating Provider/Extender: Skipper Cliche in Treatment: 17 Information Obtained from: Patient Chief Complaint Bilateral LE Ulcers Electronic Signature(s) Signed: 09/22/2021 2:26:08 PM By: Worthy Keeler PA-C Entered By: Worthy Keeler on 09/22/2021 14:26:07 Fleissner, Nanine Means ([id number]) -------------------------------------------------------------------------------- HPI Details Patient Name: [name] A. Date of Service: 09/22/2021 1:45 PM Medical Record Number: [id number] Patient Account Number: [id number] Date of Birth/Sex: 07/22/1955 (66 y.o. M) Treating RN: Levora Dredge Primary Care Provider: Fulton Reek Other Clinician: Referring Provider: Fulton Reek Treating Provider/Extender: Skipper Cliche in Treatment: 18 History of Present Illness HPI Description: Admission 8/31 [name] is a 66 year old male with a past medical history of uncontrolled insulin-dependent type 2 diabetes, CAD, osteomyelitis to the left third toe status post partial third ray amputation, and peripheral arterial disease that presents for a left plantar foot wound and bilateral lower extremity wounds. The left plantar foot wound has been present for at least 7 months. He has been following podiatry for this issue. He uses silver collagen to this area. He reports that in the past 1 to 2 months he has developed multiple scattered open wounds to his lower extremities bilaterally that have worsened over time. He states that they often develop as either blisters or he hits his leg against an  object.He is currently using Betadine to these areas. He denies signs of infection. He has compression stockings however he cannot put these on very easily. Also he is on his feet most of the day. He states he does not have anyone to help him do the activities he needs to get done. Readmission: 05/26/2021 upon evaluation today patient presents for reevaluation here in the clinic concerning issues he is been having with his bilateral lower extremities, his right great toe, and left heel primarily. With that being said he has been in the hospital recently and his hemoglobin A1c on January 10 was 9.2. With that being said he does have a lot of really thickened dry skin attached to his legs when he try to remove that today. He is in agreement with the plan. Nonetheless I do believe that based on what we are seeing he is been having a lot of issues with his congestive heart failure I do have him on fluid pills he does go to the bathroom quite a bit. Subsequently has been in [employer] but I do not think that is doing quite enough to help control his edema. He also needs something better than Santyl to put on the heel right now when he was in the hospital they actually cut a space open in the compression around his heel so they can open it up and actually apply the Santyl daily. I think he needs something better than that. Again the patient does have a history of diabetes mellitus type 2, chronic venous insufficiency, hypertension, and congestive heart failure. He also has signs of stage III lymphedema as well. 06/02/2021 upon evaluation today patient appears to be doing decently well in regard to his wounds. Fortunately I do not see any signs of active infection at this time which is great news. Fortunately I think that he is making excellent progress. Nonetheless I do think  that we seem to be making some progress here. 06/09/2021 upon evaluation today patient appears to be doing well with regard to his  legs all things considered unfortunately the left heel is not doing nearly as well as what I like to see. There is a lot of necrotic tissue this is very boggy. I think that it is going to require potentially much more debridement but again we really need to get the results of the arterial testing done and this is actually scheduled for the 15th. 2/17; arrives in clinic today with the left plantar heel wound completely necrotic and malodorous. Comparing the picture with last week this looks quite a bit worse. He has been using Iodoflex under compression. He has an area on the right first toe which seems clean and better and a new area on the right anterior mid Tibia. I was unable to find the results of his arterial studies that he had done on 1/15. Presumably they have not been read. I can see that they have been done 06/23/2021 upon evaluation today patient appears to be doing well currently in regard to his legs. I am actually very pleased in that regard. Fortunately I do not see any signs of active infection at this time. I have reviewed his arterial study and it appeared to be pretty good with TBI's on the left of 0.88 on the right 0.87. Overall I think that we are definitely headed in the right direction which is great news. Fortunately I do not see any signs of active infection locally or systemically at this time. 06/30/2021 upon evaluation today patient appears to be doing well with regard to the right lower extremity ulcers with very pleased in this regard. I do feel like the Iodoflex has done well here. In regard to the left heel location this is something that I am actually a little bit more concerned about I really think that switching over to the Dakin's moistened gauze dressing may be the best way to go at this point based on what we are seeing. We have been using Iodoflex is just not clearing up quite as well as I would like to see and I think this may be a better method at which to  get things under better control. The good news is he does have his Velcro compression wrap so we can try that at this point as well. 3/10; patient has chronic venous insufficiency with a wound on the right anterior mid tibia, dorsal right first toe a large plantar left heel wound. We are using Dakin's on the left heel and Iodosorb on the right anterior lower extremity and the right first toe. The areas on the right are apparently a lot better. The left heel is certainly a lot better than I remember seeing this the last time 3 weeks ago. He is using a scooter to offload the left heel 07/14/2021 upon evaluation today patient appears to be doing well with regard to his wounds in general. The heel is showing signs of improvement which is great on the left. On the right side leg this appears to be almost completely healed. The biggest issue is he has a lot of the dry skin building up I do believe that he is going to need to switch over to the Velcro wraps so he can put lotion on these areas to try to keep it from building up as much he will also be able to wash the leg which will be beneficial as  well in my opinion. We discussed that in greater detail today. 07/21/2021 on inspection today patient's left heel is actually showing signs of excellent improvement which is great news overall very pleased in that regard. I do not see any evidence of active infection locally nor systemically which is great news as well. 07/28/2021 upon evaluation today patient appears to be doing better in all wound locations except for his left heel. He has been up on it more in the past week and it shows. He does have some deep tissue injury as well as some additional breakdown unfortunately. I think that he needs to be very careful with this because if he is not is going to risk opening up into a much deeper wound which is definitely not what we want to see. 08-04-2021 upon evaluation today patient unfortunately is doing  significantly worse in regard to his left heel ulcer. It actually appears to be much Smail, Lamarcus A. ([id number]) more necrotic than what we have been seeing. This was actually doing quite well until really last week when he was up on it a lot more. Subsequently he then ended up with over the last week staying off of it he tells me he is pretty much been completely off of it using his knee scooter to get around anywhere. Nonetheless this has worsened and appears to actually necrotic with an odor. I am actually concerned that he probably has an infection we had him on 3 weeks of oral Augmentin unfortunately that does not appear to have cleared everything he had previously tested positive for Enterococcus and Proteus as the 2 bacteria that we had him on the Augmentin for. Nonetheless at this point I am not certain that IV antibiotics are not warranted. He had 3 weeks of oral antibiotics that did not appear to be significantly effective. He probably needs to go to the ER for further evaluation and treatment. The good news is his leg ulcers actually seem to be doing well. That is on the right leg. His left heel that is the problem. 09-08-2021 upon evaluation today patient appears to be doing better actually than the last time I saw him. He was in the hospital for quite some time in the have actually placed him on antibiotic therapy. He is currently on IV Zosyn. He tells me that they are going to place him back on Augmentin in about 2 weeks according to the plan that he is aware of anyway. I do believe that this is definitely sound like a good way to go he is likely going to need ongoing treatment as far as that is concerned for some time but nonetheless I am pleased with where we stand he did had a partial calcanectomy. He is currently utilizing a wound VAC as well. 09-22-2021 upon evaluation today patient appears to be doing well with regard to his wounds. His right leg looks to be doing much better  which is excellent news. Overall I think his left heel is also doing excellent. This appears to be much cleaner there is a lot of maceration around the edges of the wound but other than that I really feel like he is on the right track here. Electronic Signature(s) Signed: 09/22/2021 4:37:16 PM By: Worthy Keeler PA-C Entered By: Worthy Keeler on 09/22/2021 16:37:15 Barot, Nanine Means ([id number]) -------------------------------------------------------------------------------- Physical Exam Details Patient Name: Verga, Hawke A. Date of Service: 09/22/2021 1:45 PM Medical Record Number: [id number] Patient Account Number: [id number] Date of Birth/Sex: January 19, 1956 (  66 y.o. M) Treating RN: Levora Dredge Primary Care Provider: Fulton Reek Other Clinician: Referring Provider: Fulton Reek Treating Provider/Extender: Skipper Cliche in Treatment: 37 Constitutional Well-nourished and well-hydrated in no acute distress. Respiratory normal breathing without difficulty. Psychiatric this patient is able to make decisions and demonstrates good insight into disease process. Alert and Oriented x 3. pleasant and cooperative. Notes Upon inspection patient's wound bed actually showed signs of good granulation epithelization at this point. Fortunately I do not see any evidence of active infection locally or systemically which is great news and overall I am extremely pleased with where things stand today. Electronic Signature(s) Signed: 09/22/2021 4:37:56 PM By: Worthy Keeler PA-C Entered By: Worthy Keeler on 09/22/2021 16:37:56 Bethune, Nanine Means ([id number]) -------------------------------------------------------------------------------- Physician Orders Details Patient Name: Hayward, Kayven A. Date of Service: 09/22/2021 1:45 PM Medical Record Number: [id number] Patient Account Number: [id number] Date of Birth/Sex: [birth year] (66 y.o. M) Treating RN: Levora Dredge Primary Care  Provider: Fulton Reek Other Clinician: Referring Provider: Fulton Reek Treating Provider/Extender: Skipper Cliche in Treatment: 17 Verbal / Phone Orders: No Diagnosis Coding ICD-10 Coding Code Description [id number] Pressure ulcer of left heel, stage 3 E11.621 Type 2 diabetes mellitus with foot ulcer L97.522 Non-pressure chronic ulcer of other part of left foot with fat layer exposed L89.893 Pressure ulcer of other site, stage 3 I87.2 Venous insufficiency (chronic) (peripheral) I50.42 Chronic combined systolic (congestive) and diastolic (congestive) heart failure I10 Essential (primary) hypertension L97.818 Non-pressure chronic ulcer of other part of right lower leg with other specified severity Follow-up Appointments o Return Appointment in 2 weeks. o Nurse Visit as needed [place]: - BAYADA----non weight bearing on left foot as much as possible until left heel is healed o [place] for wound care. May utilize formulary equivalent dressing for wound treatment orders unless otherwise specified. Home Health Nurse may visit PRN to address patientos wound care needs. - and management of wound vac to left heel o Scheduled days for dressing changes to be completed; exception, patient has scheduled wound care visit that day. o **Please direct any NON-WOUND related issues/requests for orders to patient's Primary Care Physician. **If current dressing causes regression in wound condition, may D/C ordered dressing product/s and apply Normal Saline Moist Dressing daily until next [place] or Other MD appointment. **Notify [facility] of regression in wound condition at [phone]. Bathing/ Shower/ Hygiene o May shower with wound dressing protected with water repellent cover or cast protector. o No tub bath. Anesthetic (Use 'Patient Medications' Section for Anesthetic Order Entry) o Lidocaine applied to wound  bed Edema Control - Lymphedema / Segmental Compressive Device / Other o Tubigrip single layer applied. - BILATERAL LEGS-In place of the sock that comes with Juxtelite-size F as unable to don the sock that comes with compression wrap o Patient to wear own Velcro compression garment. Remove compression stockings every night before going to bed and put on every morning when getting up. - BILATERAL LEGS-May substitute Tubi Grip F in place of the Juxtelite sock o Elevate legs to the level of the heart and pump ankles as often as possible o Elevate leg(s) parallel to the floor when sitting. o DO YOUR BEST to sleep in the bed at night. DO NOT sleep in your recliner. Long hours of sitting in a recliner leads to swelling of the legs and/or potential wounds on your backside. Non-Wound Condition o Additional non-wound orders/instructions: - Heavy Triamcinolone mixed with lotion to  bilateral lower legs Off-Loading o Turn and reposition every 2 hours - keep pressure off of left heel-wound vac handed by [facility] Additional Orders / Instructions o Follow Nutritious Diet and Increase Protein Intake - monitor blood sugar o Other: - Recommend urea cream for legs Negative Pressure Wound Therapy Schreffler, Oluwaferanmi A. ([id number]) o Wound VAC settings at 133mHg continuous pressure. Use foam to wound cavity. Please order WHITE foam to fill any tunnel/s and/or undermining when necessary. Change VAC dressing 3 X WEEK. Change canister as indicated when full. - Bayada-white foam for undermining areas, original drape for wound vac is recommended (try and avoid dermatac), please window pane to protect surrounding skin o Home Health Nurse may d/c VAC for s/s of increased infection, significant wound regression, or uncontrolled drainage. [place] [phone] o Number of foam/gauze pieces used in the dressing = Wound Treatment Wound #12 - Lower Leg Wound Laterality: Right,  Proximal Cleanser: Normal Saline 3 x Per Week/15 Days Discharge Instructions: Wash your hands with soap and water. Remove old dressing, discard into plastic bag and place into trash. Cleanse the wound with Normal Saline prior to applying a clean dressing using gauze sponges, not tissues or cotton balls. Do not scrub or use excessive force. Pat dry using gauze sponges, not tissue or cotton balls. Cleanser: Soap and Water 3 x Per Week/15 Days Discharge Instructions: Gently cleanse wound with antibacterial soap, rinse and pat dry prior to dressing wounds Cleanser: Wound Cleanser 3 x Per Week/15 Days Discharge Instructions: Wash your hands with soap and water. Remove old dressing, discard into plastic bag and place into trash. Cleanse the wound with Wound Cleanser prior to applying a clean dressing using gauze sponges, not tissues or cotton balls. Do not scrub or use excessive force. Pat dry using gauze sponges, not tissue or cotton balls. Primary Dressing: Prisma 4.34 (in) 3 x Per Week/15 Days Discharge Instructions: Moisten w/normal saline or sterile water; Cover wound as directed. Do not remove from wound bed. Secondary Dressing: ABD Pad 5x9 (in/in) 3 x Per Week/15 Days Discharge Instructions: Cover with ABD pad Secondary Dressing: Kerlix 4.5 x 4.1 (in/yd) 3 x Per Week/15 Days Discharge Instructions: Apply Kerlix 4.5 x 4.1 (in/yd) as instructed Secured With: Medipore Tape - 35M Medipore H Soft Cloth Surgical Tape, 2x2 (in/yd) 3 x Per Week/15 Days Secured With: Tubigrip Size F, 4x10 (in/yd) 3 x Per Week/15 Days Discharge Instructions: Apply Tubigrip F 3 finger-widths below knee to base of toes to secure dressing and/or for swelling. Wound #7 - Calcaneus Wound Laterality: Left Cleanser: Dakin 16 (oz) 0.25 1 x Per Day/15 Days Discharge Instructions: Use as directed. Cleanser: Normal Saline 1 x Per DONG/29Days Discharge Instructions: Wash your hands with soap and water. Remove old dressing, discard  into plastic bag and place into trash. Cleanse the wound with Normal Saline prior to applying a clean dressing using gauze sponges, not tissues or cotton balls. Do not scrub or use excessive force. Pat dry using gauze sponges, not tissue or cotton balls. Cleanser: Soap and Water 1 x Per Day/15 Days Discharge Instructions: Gently cleanse wound with antibacterial soap, rinse and pat dry prior to dressing wounds Cleanser: Wound Cleanser 1 x Per Day/15 Days Discharge Instructions: Wash your hands with soap and water. Remove old dressing, discard into plastic bag and place into trash. Cleanse the wound with Wound Cleanser prior to applying a clean dressing using gauze sponges, not tissues or cotton balls. Do not scrub or use excessive  force. Pat dry using gauze sponges, not tissue or cotton balls. Primary Dressing: Gauze 1 x Per Day/15 Days Discharge Instructions: Dakins lightly soaked gauze Secondary Dressing: ABD Pad 5x9 (in/in) 1 x Per Day/15 Days Discharge Instructions: Cover with ABD pad Secured With: Medipore Tape - 47M Medipore H Soft Cloth Surgical Tape, 2x2 (in/yd) 1 x Per Day/15 Days Secured With: Kerlix Roll Sterile or Non-Sterile 6-ply 4.5x4 (yd/yd) 1 x Per Day/15 Days Discharge Instructions: Apply Kerlix as directed Secured With: Tubigrip Size F, 4x10 (in/yd) 1 x Per Day/15 Days Discharge Instructions: Apply Tubigrip F 3 finger-widths below knee to base of toes to secure dressing and/or for swelling. Compression Wrap: Left leg JUXTELITE WITH TUBI GRIP UNDER 1 x Per Day/15 Days Crisman, Zackaria A. ([id number]) Compression Stockings: Circaid Juxta Lite Compression Wrap Left Leg Compression Amount: 30-40 mmHG Discharge Instructions: Apply Circaid Juxta Lite Compression Wrap as directed Electronic Signature(s) Signed: 09/22/2021 4:31:40 PM By: Levora Dredge Signed: 09/22/2021 4:54:58 PM By: Worthy Keeler PA-C Entered By: Levora Dredge on 09/22/2021 15:18:00 Mathey, Nanine Means  ([id number]) -------------------------------------------------------------------------------- Problem List Details Patient Name: Petrov, Zohaib A. Date of Service: 09/22/2021 1:45 PM Medical Record Number: [id number] Patient Account Number: [id number] Date of Birth/Sex: 06-24-1955 (66 y.o. M) Treating RN: Levora Dredge Primary Care Provider: Fulton Reek Other Clinician: Referring Provider: Fulton Reek Treating Provider/Extender: Skipper Cliche in Treatment: 17 Active Problems ICD-10 Encounter Code Description Active Date MDM Diagnosis [phone] Pressure ulcer of left heel, stage 3 05/26/2021 No Yes E11.621 Type 2 diabetes mellitus with foot ulcer 05/26/2021 No Yes L97.522 Non-pressure chronic ulcer of other part of left foot with fat layer 05/26/2021 No Yes exposed L89.893 Pressure ulcer of other site, stage 3 05/26/2021 No Yes I87.2 Venous insufficiency (chronic) (peripheral) 05/26/2021 No Yes I50.42 Chronic combined systolic (congestive) and diastolic (congestive) heart 05/26/2021 No Yes failure I10 Essential (primary) hypertension 05/26/2021 No Yes L97.818 Non-pressure chronic ulcer of other part of right lower leg with other 06/16/2021 No Yes specified severity Inactive Problems Resolved Problems Electronic Signature(s) Signed: 09/22/2021 2:26:04 PM By: Worthy Keeler PA-C Entered By: Worthy Keeler on 09/22/2021 14:26:03 Rathel, Nanine Means ([id number]) -------------------------------------------------------------------------------- Progress Note Details Patient Name: Cofer, Arcangel A. Date of Service: 09/22/2021 1:45 PM Medical Record Number: [id number] Patient Account Number: [id number] Date of Birth/Sex: 1956-01-17 (66 y.o. M) Treating RN: Levora Dredge Primary Care Provider: Fulton Reek Other Clinician: Referring Provider: Fulton Reek Treating Provider/Extender: Skipper Cliche in Treatment: 17 Subjective Chief Complaint Information obtained from  Patient Bilateral LE Ulcers History of Present Illness (HPI) Admission 8/31 Mr. bobie kistler is a 66 year old male with a past medical history of uncontrolled insulin-dependent type 2 diabetes, CAD, osteomyelitis to the left third toe status post partial third ray amputation, and peripheral arterial disease that presents for a left plantar foot wound and bilateral lower extremity wounds. The left plantar foot wound has been present for at least 7 months. He has been following podiatry for this issue. He uses silver collagen to this area. He reports that in the past 1 to 2 months he has developed multiple scattered open wounds to his lower extremities bilaterally that have worsened over time. He states that they often develop as either blisters or he hits his leg against an object.He is currently using Betadine to these areas. He denies signs of infection. He has compression stockings however he cannot put these on very easily. Also he is on his feet most of the day. He states he does not  have anyone to help him do the activities he needs to get done. Readmission: 05/26/2021 upon evaluation today patient presents for reevaluation here in the clinic concerning issues he is been having with his bilateral lower extremities, his right great toe, and left heel primarily. With that being said he has been in the hospital recently and his hemoglobin A1c on January 10 was 9.2. With that being said he does have a lot of really thickened dry skin attached to his legs when he try to remove that today. He is in agreement with the plan. Nonetheless I do believe that based on what we are seeing he is been having a lot of issues with his congestive heart failure I do have him on fluid pills he does go to the bathroom quite a bit. Subsequently has been in [employer] but I do not think that is doing quite enough to help control his edema. He also needs something better than Santyl to put on the heel right now  when he was in the hospital they actually cut a space open in the compression around his heel so they can open it up and actually apply the Santyl daily. I think he needs something better than that. Again the patient does have a history of diabetes mellitus type 2, chronic venous insufficiency, hypertension, and congestive heart failure. He also has signs of stage III lymphedema as well. 06/02/2021 upon evaluation today patient appears to be doing decently well in regard to his wounds. Fortunately I do not see any signs of active infection at this time which is great news. Fortunately I think that he is making excellent progress. Nonetheless I do think that we seem to be making some progress here. 06/09/2021 upon evaluation today patient appears to be doing well with regard to his legs all things considered unfortunately the left heel is not doing nearly as well as what I like to see. There is a lot of necrotic tissue this is very boggy. I think that it is going to require potentially much more debridement but again we really need to get the results of the arterial testing done and this is actually scheduled for the 15th. 2/17; arrives in clinic today with the left plantar heel wound completely necrotic and malodorous. Comparing the picture with last week this looks quite a bit worse. He has been using Iodoflex under compression. He has an area on the right first toe which seems clean and better and a new area on the right anterior mid Tibia. I was unable to find the results of his arterial studies that he had done on 1/15. Presumably they have not been read. I can see that they have been done 06/23/2021 upon evaluation today patient appears to be doing well currently in regard to his legs. I am actually very pleased in that regard. Fortunately I do not see any signs of active infection at this time. I have reviewed his arterial study and it appeared to be pretty good with TBI's on the left of 0.88 on  the right 0.87. Overall I think that we are definitely headed in the right direction which is great news. Fortunately I do not see any signs of active infection locally or systemically at this time. 06/30/2021 upon evaluation today patient appears to be doing well with regard to the right lower extremity ulcers with very pleased in this regard. I do feel like the Iodoflex has done well here. In regard to the left heel location  this is something that I am actually a little bit more concerned about I really think that switching over to the Dakin's moistened gauze dressing may be the best way to go at this point based on what we are seeing. We have been using Iodoflex is just not clearing up quite as well as I would like to see and I think this may be a better method at which to get things under better control. The good news is he does have his Velcro compression wrap so we can try that at this point as well. 3/10; patient has chronic venous insufficiency with a wound on the right anterior mid tibia, dorsal right first toe a large plantar left heel wound. We are using Dakin's on the left heel and Iodosorb on the right anterior lower extremity and the right first toe. The areas on the right are apparently a lot better. The left heel is certainly a lot better than I remember seeing this the last time 3 weeks ago. He is using a scooter to offload the left heel 07/14/2021 upon evaluation today patient appears to be doing well with regard to his wounds in general. The heel is showing signs of improvement which is great on the left. On the right side leg this appears to be almost completely healed. The biggest issue is he has a lot of the dry skin building up I do believe that he is going to need to switch over to the Velcro wraps so he can put lotion on these areas to try to keep it from building up as much he will also be able to wash the leg which will be beneficial as well in my opinion. We discussed that in  greater detail today. 07/21/2021 on inspection today patient's left heel is actually showing signs of excellent improvement which is great news overall very pleased in that regard. I do not see any evidence of active infection locally nor systemically which is great news as well. Oddo, KENECHUKWU ECKSTEIN ([id number]) 07/28/2021 upon evaluation today patient appears to be doing better in all wound locations except for his left heel. He has been up on it more in the past week and it shows. He does have some deep tissue injury as well as some additional breakdown unfortunately. I think that he needs to be very careful with this because if he is not is going to risk opening up into a much deeper wound which is definitely not what we want to see. 08-04-2021 upon evaluation today patient unfortunately is doing significantly worse in regard to his left heel ulcer. It actually appears to be much more necrotic than what we have been seeing. This was actually doing quite well until really last week when he was up on it a lot more. Subsequently he then ended up with over the last week staying off of it he tells me he is pretty much been completely off of it using his knee scooter to get around anywhere. Nonetheless this has worsened and appears to actually necrotic with an odor. I am actually concerned that he probably has an infection we had him on 3 weeks of oral Augmentin unfortunately that does not appear to have cleared everything he had previously tested positive for Enterococcus and Proteus as the 2 bacteria that we had him on the Augmentin for. Nonetheless at this point I am not certain that IV antibiotics are not warranted. He had 3 weeks of oral antibiotics that did not appear to be  significantly effective. He probably needs to go to the ER for further evaluation and treatment. The good news is his leg ulcers actually seem to be doing well. That is on the right leg. His left heel that is the problem. 09-08-2021  upon evaluation today patient appears to be doing better actually than the last time I saw him. He was in the hospital for quite some time in the have actually placed him on antibiotic therapy. He is currently on IV Zosyn. He tells me that they are going to place him back on Augmentin in about 2 weeks according to the plan that he is aware of anyway. I do believe that this is definitely sound like a good way to go he is likely going to need ongoing treatment as far as that is concerned for some time but nonetheless I am pleased with where we stand he did had a partial calcanectomy. He is currently utilizing a wound VAC as well. 09-22-2021 upon evaluation today patient appears to be doing well with regard to his wounds. His right leg looks to be doing much better which is excellent news. Overall I think his left heel is also doing excellent. This appears to be much cleaner there is a lot of maceration around the edges of the wound but other than that I really feel like he is on the right track here. Objective Constitutional Well-nourished and well-hydrated in no acute distress. Vitals Time Taken: 2:29 PM, Height: 73 in, Weight: 371 lbs, BMI: 48.9, Temperature: 97.5 F, Pulse: 119 bpm, Respiratory Rate: 18 breaths/min, Blood Pressure: 140/95 mmHg, Pulse Oximetry: 95 %. General Notes: PT STATES PULSE RUNS HIGH, PT DENIES ANY CHEST PAIN Respiratory normal breathing without difficulty. Psychiatric this patient is able to make decisions and demonstrates good insight into disease process. Alert and Oriented x 3. pleasant and cooperative. General Notes: Upon inspection patient's wound bed actually showed signs of good granulation epithelization at this point. Fortunately I do not see any evidence of active infection locally or systemically which is great news and overall I am extremely pleased with where things stand today. Integumentary (Hair, Skin) Wound #11 status is Healed - Epithelialized. Original  cause of wound was Gradually Appeared. The date acquired was: 06/16/2021. The wound has been in treatment 14 weeks. The wound is located on the Right Lower Leg. The wound measures 0cm length x 0cm width x 0cm depth; 0cm^2 area and 0cm^3 volume. There is no tunneling or undermining noted. There is a none present amount of drainage noted. There is no granulation within the wound bed. There is a large (67-100%) amount of necrotic tissue within the wound bed. Wound #12 status is Open. Original cause of wound was Gradually Appeared. The date acquired was: 07/21/2021. The wound has been in treatment 9 weeks. The wound is located on the Right,Proximal Lower Leg. The wound measures 1.7cm length x 0.7cm width x 0.1cm depth; 0.935cm^2 area and 0.093cm^3 volume. There is Fat Layer (Subcutaneous Tissue) exposed. There is no tunneling or undermining noted. There is a medium amount of serosanguineous drainage noted. There is medium (34-66%) red, hyper - granulation within the wound bed. There is a medium (34-66%) amount of necrotic tissue within the wound bed including Adherent Slough. Wound #7 status is Open. Original cause of wound was Gradually Appeared. The date acquired was: 05/05/2021. The wound has been in treatment 17 weeks. The wound is located on the Left Calcaneus. The wound measures 3.5cm length x 5.7cm width x 2.5cm depth;  15.669cm^2 area and 39.172cm^3 volume. There is bone and Fat Layer (Subcutaneous Tissue) exposed. There is no undermining noted, however, there is tunneling at 12:00 with a maximum distance of 3cm. There is a large amount of serosanguineous drainage noted. The wound margin is thickened. There is large (67-100%) red, pink granulation within the wound bed. There is a small (1-33%) amount of necrotic tissue within the wound bed including Adherent Slough. Assessment Quilling, Toan A. 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 Nurse Visit as needed Home Health: [facility]: - BAYADA----non weight bearing on left foot as much as possible until left heel is healed [facility] for wound care. May utilize formulary equivalent dressing for wound treatment orders unless otherwise specified. Home Health Nurse may visit [facility] to address patient s wound care needs. - and management of wound vac to left heel Scheduled days for dressing changes to be completed; exception, patient has scheduled wound care visit that day. **Please direct any NON-WOUND related issues/requests for orders to patient's Primary Care Physician. **If current dressing causes regression in wound condition, may D/C ordered dressing product/s and apply Normal Saline Moist Dressing daily until next [place] or Other MD appointment. **Notify [facility] of regression in wound condition at [phone]. Bathing/ Shower/ Hygiene: May shower with wound dressing protected with water repellent cover or cast protector. No tub bath. Anesthetic (Use 'Patient Medications' Section for Anesthetic Order Entry): Lidocaine applied to wound bed Edema Control - Lymphedema / Segmental Compressive Device / Other: Tubigrip single layer applied. - BILATERAL LEGS-In place of the sock that comes with Juxtelite-size F as unable to don the sock that comes with compression wrap Patient to wear own Velcro compression garment. Remove compression stockings every night before going to bed and put on every morning  when getting up. - BILATERAL LEGS-May substitute Tubi Grip F in place of the Juxtelite sock Elevate legs to the level of the heart and pump ankles as often as possible Elevate leg(s) parallel to the floor when sitting. DO YOUR BEST to sleep in the bed at night. DO NOT sleep in your recliner. Long hours of sitting in a recliner leads to swelling of the legs and/or potential wounds on your backside. Non-Wound Condition: Additional non-wound orders/instructions: - Heavy Triamcinolone mixed with lotion to bilateral lower legs Off-Loading: Turn and reposition every 2 hours - keep pressure off of left heel-wound vac handed by [facility] Additional Orders / Instructions: Follow Nutritious Diet and Increase Protein Intake - monitor blood sugar Other: - Recommend urea cream for legs Negative Pressure Wound Therapy: Wound VAC settings at 125mHg continuous pressure. Use foam to wound cavity. Please order WHITE foam to fill any tunnel/s and/or undermining when necessary. Change VAC dressing 3 X WEEK. Change canister as indicated when full. - Bayada-white foam for undermining areas, original drape for wound vac is recommended (try and avoid dermatac), please window pane to protect surrounding skin Home Health Nurse may d/c VAC for s/s of increased infection, significant wound regression, or uncontrolled drainage. [place] [phone] Number of foam/gauze pieces  used in the dressing = WOUND #12: - Lower Leg Wound Laterality: Right, Proximal Cleanser: Normal Saline 3 x Per Week/15 Days Discharge Instructions: Wash your hands with soap and water. Remove old dressing, discard into plastic bag and place into trash. Cleanse the wound with Normal Saline prior to applying a clean dressing using gauze sponges, not tissues or cotton balls. Do not scrub or use excessive force. Pat dry using gauze sponges, not tissue or cotton balls. Cleanser: Soap and Water 3 x Per Week/15 Days Discharge  Instructions: Gently cleanse wound with antibacterial soap, rinse and pat dry prior to dressing wounds Cleanser: Wound Cleanser 3 x Per Week/15 Days Discharge Instructions: Wash your hands with soap and water. Remove old dressing, discard into plastic bag and place into trash. Cleanse the wound with Wound Cleanser prior to applying a clean dressing using gauze sponges, not tissues or cotton balls. Do not scrub or use excessive force. Pat dry using gauze sponges, not tissue or cotton balls. Primary Dressing: Prisma 4.34 (in) 3 x Per Week/15 Days Discharge Instructions: Moisten w/normal saline or sterile water; Cover wound as directed. Do not remove from wound bed. Secondary Dressing: ABD Pad 5x9 (in/in) 3 x Per Week/15 Days Discharge Instructions: Cover with ABD pad Secondary Dressing: Kerlix 4.5 x 4.1 (in/yd) 3 x Per Week/15 Days Discharge Instructions: Apply Kerlix 4.5 x 4.1 (in/yd) as instructed Secured With: Medipore Tape - 18M Medipore H Soft Cloth Surgical Tape, 2x2 (in/yd) 3 x Per Week/15 Days Secured With: Tubigrip Size F, 4x10 (in/yd) 3 x Per Week/15 Days Discharge Instructions: Apply Tubigrip F 3 finger-widths below knee to base of toes to secure dressing and/or for swelling. Eland, Quashawn A. ([id number]) WOUND #7: - Calcaneus Wound Laterality: Left Cleanser: Dakin 16 (oz) 0.25 1 x Per Day/15 Days Discharge Instructions: Use as directed. Cleanser: Normal Saline 1 x Per XMI/68 Days Discharge Instructions: Wash your hands with soap and water. Remove old dressing, discard into plastic bag and place into trash. Cleanse the wound with Normal Saline prior to applying a clean dressing using gauze sponges, not tissues or cotton balls. Do not scrub or use excessive force. Pat dry using gauze sponges, not tissue or cotton balls. Cleanser: Soap and Water 1 x Per Day/15 Days Discharge Instructions: Gently cleanse wound with antibacterial soap, rinse and pat dry prior to dressing  wounds Cleanser: Wound Cleanser 1 x Per Day/15 Days Discharge Instructions: Wash your hands with soap and water. Remove old dressing, discard into plastic bag and place into trash. Cleanse the wound with Wound Cleanser prior to applying a clean dressing using gauze sponges, not tissues or cotton balls. Do not scrub or use excessive force. Pat dry using gauze sponges, not tissue or cotton balls. Primary Dressing: Gauze 1 x Per Day/15 Days Discharge Instructions: Dakins lightly soaked gauze Secondary Dressing: ABD Pad 5x9 (in/in) 1 x Per Day/15 Days Discharge Instructions: Cover with ABD pad Secured With: Medipore Tape - 18M Medipore H Soft Cloth Surgical Tape, 2x2 (in/yd) 1 x Per Day/15 Days Secured With: Kerlix Roll Sterile or Non-Sterile 6-ply 4.5x4 (yd/yd) 1 x Per Day/15 Days Discharge Instructions: Apply Kerlix as directed Secured With: Tubigrip Size F, 4x10 (in/yd) 1 x Per Day/15 Days Discharge Instructions: Apply Tubigrip F 3 finger-widths below knee to base of toes to secure dressing and/or for swelling. Compression Wrap: Left leg JUXTELITE WITH TUBI GRIP UNDER 1 x Per Day/15 Days Compression Stockings: Circaid Juxta Lite Compression Wrap Compression Amount: 30-40 mmHg (left) Discharge Instructions: Apply  Circaid Juxta Lite Compression Wrap as directed 1. I would recommend currently that we continue with the wound VAC for the left heel as this seems to be doing excellent. 2. Also can recommend that we have the patient continue to monitor for any signs of worsening infection. If anything changes to let me know as soon as possible. We will see patient back for reevaluation in 1 week here in the clinic. If anything worsens or changes patient will contact our office for additional recommendations. Electronic Signature(s) Signed: 09/22/2021 4:38:35 PM By: Worthy Keeler PA-C Entered By: Worthy Keeler on 09/22/2021 16:38:34 Sauber, Nanine Means  ([id number]) -------------------------------------------------------------------------------- SuperBill Details Patient Name: [name] A. Date of Service: 09/22/2021 Medical Record Number: [id number] Patient Account Number: [id number] Date of Birth/Sex: 24-Aug-1955 (66 y.o. M) Treating RN: Levora Dredge Primary Care Provider: Fulton Reek Other Clinician: Referring Provider: Fulton Reek Treating Provider/Extender: Skipper Cliche in Treatment: 17 Diagnosis Coding ICD-10 Codes Code Description [phone] Pressure ulcer of left heel, stage 3 E11.621 Type 2 diabetes mellitus with foot ulcer L97.522 Non-pressure chronic ulcer of other part of left foot with fat layer exposed L89.893 Pressure ulcer of other site, stage 3 I87.2 Venous insufficiency (chronic) (peripheral) I50.42 Chronic combined systolic (congestive) and diastolic (congestive) heart failure I10 Essential (primary) hypertension L97.818 Non-pressure chronic ulcer of other part of right lower leg with other specified severity Facility Procedures CPT4 Code: [id number] Description: 99213 - WOUND CARE VISIT-LEV 3 EST PT Modifier: Quantity: 1 Physician Procedures CPT4 Code: 9024097 Description: 99214 - WC PHYS LEVEL 4 - EST PT Modifier: Quantity: 1 CPT4 Code: Description: ICD-10 Diagnosis Description L89.623 Pressure ulcer of left heel, stage 3 E11.621 Type 2 diabetes mellitus with foot ulcer L97.522 Non-pressure chronic ulcer of other part of left foot with fat layer exp L89.893 Pressure ulcer of other site,  stage 3 Modifier: osed Quantity: Electronic Signature(s) Signed: 09/22/2021 4:39:18 PM By: Worthy Keeler PA-C Previous Signature: 09/22/2021 4:23:21 PM Version By: Levora Dredge Entered By: Worthy Keeler on 09/22/2021 16:39:18

## 2021-09-25 NOTE — Progress Notes (Unsigned)
Patient ID: Alec Less., male    DOB: 01/22/1956, 66 y.o.   MRN: [id number]   [name] is a 66 y/o male with a history of atrial fibrillation, asthma, CAD, DM, hyperlipidemia, HTN, depression, gout, hypogonadism, PVD, COPD, pleurisy, sleep apnea, previous tobacco use and chronic heart failure.   Echo report from 05/10/21 reviewed and showed an EF of 50-55% along with moderate LVH  LHC done 02/02/20 and showed: Mid RCA lesion is 90% stenosed. Prox LAD to Mid LAD lesion is 100% stenosed. Mid Cx lesion is 100% stenosed. Origin to Prox Graft lesion is 100% stenosed.  LM normal LAD-100% proxiimal LCx-100% mid RCA-90% mid  Admitted 08/04/21 due to worsening cellulitis of his left foot that was not responding to outpatient antibiotics. Wound, vascular and ID consults obtained.  MRI of the left ankle was done noting early osteomyelitis. Podiatry took patient on 4/10 for wound debridement and partial calcanectomy.  Postop, they are recommending 6 weeks of IV antibiotics. PT/OT evaluations done. Discharged after 7 days. Admitted 05/08/21 due to shortness of breath, pedal edema and weeping of fluids.  LLE angiogram showed no significant stenosis. Vascular surgery, wound and cardiology consults obtained. Given IV antibiotics due to cellulitis. Initially given IV lasix with transition to oral diuretics. Bedside debridement of left foot done by podiatry. Discharged after 8 days.  He presents today for a follow up visit with a chief complaint of moderate shortness of breath with little exertion. He describes this as chronic although has been worsening over the last few weeks. He has associated fatigue, pedal edema (worsening), palpitations, abdominal distention (worsening), difficulty sleeping and left foot pain along with this. He denies any dizziness, chest pain or cough.   Says that he's been taking 60-'80mg'$  furosemide daily and doesn't feel like he's getting any response from it like he used to. He  says that he feels like he was discharged back in April still retaining fluid.   Hasn't been weighing daily. Continues to have a wound vac for his left foot wound. Reports that it's healing well.   Past Medical History:  Diagnosis Date   Arrhythmia    atrial fibrillation   Asthma    CHF (congestive heart failure) (HCC)    COPD (chronic obstructive pulmonary disease) (HCC)    Coronary artery disease    Depression    Diabetes mellitus without complication (HCC)    Gout    History anabolic steroid use    Hyperlipidemia    Hypertension    Hypogonadism in male    Morbid obesity ([place])    Myocardial infarction ([place])    Peripheral vascular disease ([place])    Perirectal abscess    Pleurisy    Sleep apnea    CPAP at night, no oxygen   Varicella    Past Surgical History:  Procedure Laterality Date   ABDOMINAL AORTIC ANEURYSM REPAIR     ACHILLES TENDON SURGERY Left 01/10/2021   Procedure: ACHILLES LENGTHENING/KIDNER;  Surgeon: Caroline More, DPM;  Location: [facility];  Service: Podiatry;  Laterality: Left;   AMPUTATION TOE Right 02/10/2016   Procedure: AMPUTATION TOE 3RD TOE;  Surgeon: Samara Deist, DPM;  Location: [facility];  Service: Podiatry;  Laterality: Right;   AMPUTATION TOE Left 02/24/2020   Procedure: AMPUTATION TOE;  Surgeon: Caroline More, DPM;  Location: [facility];  Service: Podiatry;  Laterality: Left;   APPLICATION OF WOUND VAC Left 02/29/2020   Procedure: APPLICATION OF WOUND VAC;  Surgeon: Caroline More, DPM;  Location: [facility];  Service: Podiatry;  Laterality: Left;   COLONOSCOPY WITH PROPOFOL [name] 11/18/2015   Procedure: COLONOSCOPY WITH PROPOFOL;  Surgeon: Manya Silvas, MD;  Location: [facility];  Service: Endoscopy;  Laterality: [name];   CORONARY ARTERY BYPASS GRAFT     CORONARY STENT INTERVENTION [name] 02/02/2020   Procedure: CORONARY STENT INTERVENTION;  Surgeon: Isaias Cowman, MD;  Location: [place] [facility];  Service: Cardiovascular;  Laterality: [name];    INCISION AND DRAINAGE Left 08/07/2021   Procedure: INCISION AND DRAINAGE-Partial Calcanectomy;  Surgeon: Caroline More, DPM;  Location: [facility];  Service: Podiatry;  Laterality: Left;   IRRIGATION AND DEBRIDEMENT FOOT Left 02/29/2020   Procedure: IRRIGATION AND DEBRIDEMENT FOOT;  Surgeon: Caroline More, DPM;  Location: [facility];  Service: Podiatry;  Laterality: Left;   IRRIGATION AND DEBRIDEMENT FOOT Left 02/24/2020   Procedure: IRRIGATION AND DEBRIDEMENT FOOT;  Surgeon: Caroline More, DPM;  Location: [facility];  Service: Podiatry;  Laterality: Left;   KNEE ARTHROSCOPY     LEFT HEART CATH AND CORS/GRAFTS ANGIOGRAPHY [name] 02/02/2020   Procedure: LEFT HEART CATH AND CORS/GRAFTS ANGIOGRAPHY;  Surgeon: Teodoro Spray, MD;  Location: [place] [facility];  Service: Cardiovascular;  Laterality: [name];   LOWER EXTREMITY ANGIOGRAPHY Left 02/25/2020   Procedure: Lower Extremity Angiography;  Surgeon: Algernon Huxley, MD;  Location: [place] [facility];  Service: Cardiovascular;  Laterality: Left;   LOWER EXTREMITY ANGIOGRAPHY Left 01/04/2021   Procedure: LOWER EXTREMITY ANGIOGRAPHY;  Surgeon: Algernon Huxley, MD;  Location: [place] [facility];  Service: Cardiovascular;  Laterality: Left;   METATARSAL HEAD EXCISION Left 01/10/2021   Procedure: METATARSAL HEAD EXCISION - LEFT 5th;  Surgeon: Caroline More, DPM;  Location: [facility];  Service: Podiatry;  Laterality: Left;   PERIPHERAL VASCULAR CATHETERIZATION Right 01/24/2016   Procedure: Lower Extremity Angiography;  Surgeon: Katha Cabal, MD;  Location: [place] [facility];  Service: Cardiovascular;  Laterality: Right;   PERIPHERAL VASCULAR CATHETERIZATION Right 01/25/2016   Procedure: Lower Extremity Angiography;  Surgeon: Katha Cabal, MD;  Location: [place] [facility];  Service: Cardiovascular;  Laterality: Right;   TOE AMPUTATION     TONSILLECTOMY     Family History  Problem Relation Age of Onset   Hypertension Father    Coronary artery disease  Father    Alcohol abuse Father    Heart failure Brother    Social History   Tobacco Use   Smoking status: Former    Packs/day: 0.50    Years: 45.00    Pack years: 22.50    Types: Cigarettes    Quit date: 04/05/2015    Years since quitting: 6.4   Smokeless tobacco: Never  Substance Use Topics   Alcohol use: Not Currently    Alcohol/week: 3.0 standard drinks    Types: 3 Glasses of wine per week   Allergies  Allergen Reactions   Penicillins Other (See Comments)    Happened at 66 years old and pt. stated he passed out  He has tolerated amoxicillin/clavulanate and ampicillin/sulbactam   Statins     Other reaction(s): Muscle Pain Causes legs to ache per pt   Prior to Admission medications   Medication Sig Start Date End Date Taking? Authorizing Provider  amiodarone (PACERONE) 200 MG tablet Take 200 mg by mouth daily.   Yes [provider]  amoxicillin-clavulanate (AUGMENTIN) 875-125 MG tablet Take 1 tablet by mouth 2 (two) times daily. 09/18/21  Yes Tsosie Billing, MD  apixaban (ELIQUIS) 5 MG  TABS tablet Take 1 tablet (5 mg total) by mouth 2 (two) times daily. 01/26/16  Yes Stegmayer, Janalyn Harder, PA-C  aspirin EC 81 MG EC tablet Take 1 tablet (81 mg total) by mouth daily. Swallow whole. 05/17/21  Yes Dessa Phi, DO  Cholecalciferol 25 MCG (1000 UT) tablet Take 1,000 Units by mouth daily.   Yes [provider]  Continuous Blood Gluc Sensor (FREESTYLE LIBRE 2 SENSOR) MISC  09/08/21  Yes [provider]  Cyanocobalamin (VITAMIN B-12) 5000 MCG TBDP Take 10,000 mcg by mouth daily.   Yes [provider]  diltiazem (CARDIZEM CD) 300 MG 24 hr capsule Take 1 capsule (300 mg total) by mouth daily. 01/16/21  Yes Bonnielee Haff, MD  ezetimibe (ZETIA) 10 MG tablet Take 10 mg by mouth at bedtime.   Yes [provider]  ferrous sulfate 325 (65 FE) MG tablet Take 325 mg by mouth daily with breakfast.   Yes [provider]  furosemide  (LASIX) 40 MG tablet Take 40 mg by mouth daily. (Take with '20mg'$  tablet to equal '60mg'$  total)   Yes [provider]  gabapentin (NEURONTIN) 300 MG capsule Take 300 mg by mouth at bedtime. 12/02/20  Yes [provider]  HUMALOG 100 UNIT/ML injection Inject into the skin. 08/18/21  Yes [provider]  insulin glargine (LANTUS) 100 UNIT/ML Solostar Pen Inject 25 Units into the skin in the morning and at bedtime. 08/11/21  Yes Fritzi Mandes, MD  isosorbide mononitrate (IMDUR) 60 MG 24 hr tablet Take 60 mg by mouth 2 (two) times daily. 04/21/21  Yes [provider]  losartan (COZAAR) 50 MG tablet Take 50 mg by mouth daily.   Yes [provider]  metFORMIN (GLUCOPHAGE) 1000 MG tablet Take 1,000 mg by mouth 2 (two) times daily.  12/02/17  Yes [provider]  metoprolol tartrate (LOPRESSOR) 50 MG tablet Take 100 mg by mouth 2 (two) times daily. 04/21/21  Yes [provider]  Multiple Vitamins-Minerals (MULTIVITAMIN WITH MINERALS) tablet Take 1 tablet by mouth daily.   Yes [provider]  nitroGLYCERIN (NITROSTAT) 0.4 MG SL tablet Place 0.4 mg under the tongue every 5 (five) minutes as needed for chest pain.   Yes [provider]  pramipexole (MIRAPEX) 1 MG tablet Take 2 mg by mouth at bedtime. 11/09/20  Yes [provider]  primidone (MYSOLINE) 250 MG tablet Take 250 mg by mouth 2 (two) times daily. 01/23/20  Yes [provider]  tamsulosin (FLOMAX) 0.4 MG CAPS capsule Take 0.4 mg by mouth daily.   Yes [provider]  traMADol (ULTRAM) 50 MG tablet Take 1 tablet (50 mg total) by mouth daily as needed for moderate pain. 08/11/21  Yes Fritzi Mandes, MD  vitamin C (ASCORBIC ACID) 500 MG tablet Take 500 mg by mouth daily.   Yes [provider]  zinc gluconate 50 MG tablet Take 50 mg by mouth daily.   Yes [provider]   Review of Systems  Constitutional:  Positive for fatigue (tire easily).  Negative for appetite change.  HENT:  Negative for congestion, postnasal drip and rhinorrhea.   Eyes: Negative.   Respiratory:  Positive for shortness of breath (worsening). Negative for cough.   Cardiovascular:  Positive for palpitations and leg swelling (worsening). Negative for chest pain.  Gastrointestinal:  Positive for abdominal distention (worsening). Negative for abdominal pain.  Endocrine: Negative.   Genitourinary: Negative.   Musculoskeletal:  Positive for arthralgias (left heel). Negative for back pain.  Skin:  Positive for wound (left fooy).  Allergic/Immunologic: Negative.   Neurological:  Positive for tremors. Negative for dizziness and light-headedness.  Hematological:  Negative for adenopathy. Bruises/bleeds easily.  Psychiatric/Behavioral:  Positive for sleep disturbance (due to feet pain/ restless leg). Negative for dysphoric mood. The patient is not nervous/anxious.    Vitals:   09/26/21 1258  BP: 126/81  Pulse: (!) 113  Resp: 18  SpO2: 97%  Weight: (!) 403 lb 2 oz (182.9 kg)  Height: '6\' 4"'$  (1.93 m)   Wt Readings from Last 3 Encounters:  09/26/21 (!) 403 lb 2 oz (182.9 kg)  08/07/21 (!) 350 lb (158.8 kg)  07/04/21 (!) 368 lb 4 oz (167 kg)   Lab Results  Component Value Date   CREATININE 1.15 09/26/2021   CREATININE 1.04 08/11/2021   CREATININE 1.20 08/08/2021   Physical Exam Vitals and nursing note reviewed.  Constitutional:      Appearance: Normal appearance. He is obese. He is ill-appearing.  HENT:     Head: Normocephalic and atraumatic.     Nose: Rhinorrhea present.  Cardiovascular:     Rate and Rhythm: Regular rhythm. Tachycardia present.  Pulmonary:     Effort: No respiratory distress.     Breath sounds: No wheezing or rales.  Abdominal:     General: There is distension.     Palpations: Abdomen is soft.  Musculoskeletal:     Cervical back: Normal range of motion.     Right lower leg: Edema (4+ pitting edema in thigh; lower leg wrapped in  UNNA boot) present.     Left lower leg: Edema (4+ pitting edema in thigh; lower leg wrapped in UNNA boot) present.  Skin:    General: Skin is warm and dry.  Neurological:     General: No focal deficit present.     Mental Status: He is alert and oriented to person, place, and time.  Psychiatric:        Mood and Affect: Mood normal.        Behavior: Behavior normal.        Thought Content: Thought content normal.   Assessment & Plan:  1: Acute on Chronic heart failure with preserved ejection fraction with structural changes (LVH)- - NYHA class III - moderately fluid overloaded today with weight gain, worsening edema & worsening symptoms -  encouraged to resumed weighing daily; reminded to call for an overnight weight gain of > 2 pounds or a weekly weight gain of > 5 pounds - weight up 34.8 pounds from last visit here 2.5 months ago; weight documented as 369 pounds at PCP office just 3 weeks ago - will send for '80mg'$  IV lasix/ 63mq PO potassium - check BMP/BNP today - stop taking furosemide and begin torsemide '60mg'$  daily; if he starts urinating a lot and losing weight too quickly, he's to decrease this to '40mg'$  daily - check BMP next visit - may not be a good candidate for SGLT2 due to body habitus and frequent fungal infections around abdominal folds - consider changing his losartan to entresto - does wear his CPAP nightly  2: HTN- - BP looks good (126/81) - saw PCP ([place]) 09/06/21 - BMP 08/08/21 reviewed and showed sodium 135, potassium 5.0, creatinine 1.2 & GFR >60  3: DM- - saw endocrinology ([facility] 04/28/21; returns 09/29/21 - A1c 08/05/21 was 10.2% - glucose at home has been running in the mid 200's  4: Atrial fibrillation- - saw cardiology ([facility] 05/31/21 - regular rhythm noted, although  tachycardiac   5: PAD/ lymphedema- - currently both legs are wrapped in UNNA boots to the knees  - has wound vac for left foot wound - saw wound clinic 09/22/21   Medication list  reviewed.   Return in 3 days, sooner if needed.

## 2021-09-26 ENCOUNTER — Other Ambulatory Visit: Payer: Self-pay | Admitting: Family

## 2021-09-26 ENCOUNTER — Ambulatory Visit
Admission: RE | Admit: 2021-09-26 | Discharge: 2021-09-26 | Disposition: A | Discharge status: Home / Self Care | Payer: Medicare Other | Source: Ambulatory Visit | Attending: Family | Admitting: Family

## 2021-09-26 ENCOUNTER — Encounter: Payer: Self-pay | Admitting: Family

## 2021-09-26 ENCOUNTER — Ambulatory Visit (HOSPITAL_BASED_OUTPATIENT_CLINIC_OR_DEPARTMENT_OTHER): Payer: Medicare Other | Admitting: Family

## 2021-09-26 VITALS — BP 126/81 | HR 113 | Resp 18 | Ht 76.0 in | Wt >= 6400 oz

## 2021-09-26 DIAGNOSIS — I739 Peripheral vascular disease, unspecified: Secondary | ICD-10-CM | POA: Diagnosis not present

## 2021-09-26 DIAGNOSIS — N3001 Acute cystitis with hematuria: Secondary | ICD-10-CM | POA: Diagnosis not present

## 2021-09-26 DIAGNOSIS — R6520 Severe sepsis without septic shock: Secondary | ICD-10-CM | POA: Diagnosis not present

## 2021-09-26 DIAGNOSIS — L89620 Pressure ulcer of left heel, unstageable: Secondary | ICD-10-CM | POA: Diagnosis not present

## 2021-09-26 DIAGNOSIS — E46 Unspecified protein-calorie malnutrition: Secondary | ICD-10-CM | POA: Diagnosis not present

## 2021-09-26 DIAGNOSIS — G4730 Sleep apnea, unspecified: Secondary | ICD-10-CM | POA: Insufficient documentation

## 2021-09-26 DIAGNOSIS — E11621 Type 2 diabetes mellitus with foot ulcer: Secondary | ICD-10-CM | POA: Diagnosis not present

## 2021-09-26 DIAGNOSIS — M868X7 Other osteomyelitis, ankle and foot: Secondary | ICD-10-CM | POA: Diagnosis present

## 2021-09-26 DIAGNOSIS — Z79899 Other long term (current) drug therapy: Secondary | ICD-10-CM | POA: Insufficient documentation

## 2021-09-26 DIAGNOSIS — E11649 Type 2 diabetes mellitus with hypoglycemia without coma: Secondary | ICD-10-CM | POA: Diagnosis not present

## 2021-09-26 DIAGNOSIS — E119 Type 2 diabetes mellitus without complications: Secondary | ICD-10-CM | POA: Diagnosis not present

## 2021-09-26 DIAGNOSIS — I890 Lymphedema, not elsewhere classified: Secondary | ICD-10-CM | POA: Insufficient documentation

## 2021-09-26 DIAGNOSIS — E1151 Type 2 diabetes mellitus with diabetic peripheral angiopathy without gangrene: Secondary | ICD-10-CM | POA: Insufficient documentation

## 2021-09-26 DIAGNOSIS — L089 Local infection of the skin and subcutaneous tissue, unspecified: Secondary | ICD-10-CM | POA: Diagnosis not present

## 2021-09-26 DIAGNOSIS — I110 Hypertensive heart disease with heart failure: Secondary | ICD-10-CM | POA: Insufficient documentation

## 2021-09-26 DIAGNOSIS — I5033 Acute on chronic diastolic (congestive) heart failure: Secondary | ICD-10-CM | POA: Insufficient documentation

## 2021-09-26 DIAGNOSIS — Z515 Encounter for palliative care: Secondary | ICD-10-CM | POA: Diagnosis not present

## 2021-09-26 DIAGNOSIS — R0902 Hypoxemia: Secondary | ICD-10-CM | POA: Diagnosis not present

## 2021-09-26 DIAGNOSIS — M25461 Effusion, right knee: Secondary | ICD-10-CM | POA: Diagnosis not present

## 2021-09-26 DIAGNOSIS — M7989 Other specified soft tissue disorders: Secondary | ICD-10-CM | POA: Diagnosis not present

## 2021-09-26 DIAGNOSIS — J9602 Acute respiratory failure with hypercapnia: Secondary | ICD-10-CM | POA: Diagnosis not present

## 2021-09-26 DIAGNOSIS — Z89512 Acquired absence of left leg below knee: Secondary | ICD-10-CM | POA: Diagnosis not present

## 2021-09-26 DIAGNOSIS — M6281 Muscle weakness (generalized): Secondary | ICD-10-CM | POA: Diagnosis not present

## 2021-09-26 DIAGNOSIS — E785 Hyperlipidemia, unspecified: Secondary | ICD-10-CM | POA: Insufficient documentation

## 2021-09-26 DIAGNOSIS — R443 Hallucinations, unspecified: Secondary | ICD-10-CM | POA: Diagnosis not present

## 2021-09-26 DIAGNOSIS — N189 Chronic kidney disease, unspecified: Secondary | ICD-10-CM | POA: Diagnosis present

## 2021-09-26 DIAGNOSIS — R7881 Bacteremia: Secondary | ICD-10-CM | POA: Diagnosis not present

## 2021-09-26 DIAGNOSIS — I452 Bifascicular block: Secondary | ICD-10-CM | POA: Diagnosis not present

## 2021-09-26 DIAGNOSIS — I482 Chronic atrial fibrillation, unspecified: Secondary | ICD-10-CM | POA: Diagnosis not present

## 2021-09-26 DIAGNOSIS — A4102 Sepsis due to Methicillin resistant Staphylococcus aureus: Secondary | ICD-10-CM | POA: Diagnosis not present

## 2021-09-26 DIAGNOSIS — G9340 Encephalopathy, unspecified: Secondary | ICD-10-CM | POA: Diagnosis not present

## 2021-09-26 DIAGNOSIS — L97429 Non-pressure chronic ulcer of left heel and midfoot with unspecified severity: Secondary | ICD-10-CM | POA: Diagnosis not present

## 2021-09-26 DIAGNOSIS — Z4781 Encounter for orthopedic aftercare following surgical amputation: Secondary | ICD-10-CM | POA: Diagnosis not present

## 2021-09-26 DIAGNOSIS — E874 Mixed disorder of acid-base balance: Secondary | ICD-10-CM | POA: Diagnosis not present

## 2021-09-26 DIAGNOSIS — Z6841 Body Mass Index (BMI) 40.0 and over, adult: Secondary | ICD-10-CM | POA: Diagnosis not present

## 2021-09-26 DIAGNOSIS — I10 Essential (primary) hypertension: Secondary | ICD-10-CM

## 2021-09-26 DIAGNOSIS — Z9981 Dependence on supplemental oxygen: Secondary | ICD-10-CM | POA: Diagnosis not present

## 2021-09-26 DIAGNOSIS — R000 Tachycardia, unspecified: Secondary | ICD-10-CM | POA: Insufficient documentation

## 2021-09-26 DIAGNOSIS — E1142 Type 2 diabetes mellitus with diabetic polyneuropathy: Secondary | ICD-10-CM | POA: Diagnosis not present

## 2021-09-26 DIAGNOSIS — G4733 Obstructive sleep apnea (adult) (pediatric): Secondary | ICD-10-CM | POA: Diagnosis not present

## 2021-09-26 DIAGNOSIS — D509 Iron deficiency anemia, unspecified: Secondary | ICD-10-CM | POA: Diagnosis present

## 2021-09-26 DIAGNOSIS — J9601 Acute respiratory failure with hypoxia: Secondary | ICD-10-CM | POA: Diagnosis not present

## 2021-09-26 DIAGNOSIS — Z23 Encounter for immunization: Secondary | ICD-10-CM | POA: Diagnosis not present

## 2021-09-26 DIAGNOSIS — I130 Hypertensive heart and chronic kidney disease with heart failure and stage 1 through stage 4 chronic kidney disease, or unspecified chronic kidney disease: Secondary | ICD-10-CM | POA: Diagnosis not present

## 2021-09-26 DIAGNOSIS — M869 Osteomyelitis, unspecified: Secondary | ICD-10-CM | POA: Diagnosis not present

## 2021-09-26 DIAGNOSIS — D649 Anemia, unspecified: Secondary | ICD-10-CM | POA: Diagnosis not present

## 2021-09-26 DIAGNOSIS — L97409 Non-pressure chronic ulcer of unspecified heel and midfoot with unspecified severity: Secondary | ICD-10-CM | POA: Diagnosis not present

## 2021-09-26 DIAGNOSIS — Z87891 Personal history of nicotine dependence: Secondary | ICD-10-CM | POA: Insufficient documentation

## 2021-09-26 DIAGNOSIS — Z7189 Other specified counseling: Secondary | ICD-10-CM | POA: Diagnosis not present

## 2021-09-26 DIAGNOSIS — E1152 Type 2 diabetes mellitus with diabetic peripheral angiopathy with gangrene: Secondary | ICD-10-CM | POA: Diagnosis not present

## 2021-09-26 DIAGNOSIS — M19072 Primary osteoarthritis, left ankle and foot: Secondary | ICD-10-CM | POA: Diagnosis not present

## 2021-09-26 DIAGNOSIS — G9341 Metabolic encephalopathy: Secondary | ICD-10-CM | POA: Diagnosis not present

## 2021-09-26 DIAGNOSIS — I480 Paroxysmal atrial fibrillation: Secondary | ICD-10-CM

## 2021-09-26 DIAGNOSIS — M86172 Other acute osteomyelitis, left ankle and foot: Secondary | ICD-10-CM | POA: Diagnosis not present

## 2021-09-26 DIAGNOSIS — J449 Chronic obstructive pulmonary disease, unspecified: Secondary | ICD-10-CM | POA: Insufficient documentation

## 2021-09-26 DIAGNOSIS — D500 Iron deficiency anemia secondary to blood loss (chronic): Secondary | ICD-10-CM | POA: Diagnosis not present

## 2021-09-26 DIAGNOSIS — A419 Sepsis, unspecified organism: Secondary | ICD-10-CM | POA: Diagnosis not present

## 2021-09-26 DIAGNOSIS — I509 Heart failure, unspecified: Secondary | ICD-10-CM | POA: Diagnosis not present

## 2021-09-26 DIAGNOSIS — I70262 Atherosclerosis of native arteries of extremities with gangrene, left leg: Secondary | ICD-10-CM | POA: Diagnosis not present

## 2021-09-26 DIAGNOSIS — M86672 Other chronic osteomyelitis, left ankle and foot: Secondary | ICD-10-CM | POA: Diagnosis not present

## 2021-09-26 DIAGNOSIS — Z794 Long term (current) use of insulin: Secondary | ICD-10-CM

## 2021-09-26 DIAGNOSIS — R410 Disorientation, unspecified: Secondary | ICD-10-CM | POA: Diagnosis not present

## 2021-09-26 DIAGNOSIS — I4891 Unspecified atrial fibrillation: Secondary | ICD-10-CM | POA: Insufficient documentation

## 2021-09-26 DIAGNOSIS — L97522 Non-pressure chronic ulcer of other part of left foot with fat layer exposed: Secondary | ICD-10-CM | POA: Diagnosis not present

## 2021-09-26 DIAGNOSIS — I2510 Atherosclerotic heart disease of native coronary artery without angina pectoris: Secondary | ICD-10-CM | POA: Insufficient documentation

## 2021-09-26 DIAGNOSIS — L03116 Cellulitis of left lower limb: Secondary | ICD-10-CM | POA: Diagnosis not present

## 2021-09-26 DIAGNOSIS — M86272 Subacute osteomyelitis, left ankle and foot: Secondary | ICD-10-CM | POA: Diagnosis not present

## 2021-09-26 DIAGNOSIS — R338 Other retention of urine: Secondary | ICD-10-CM | POA: Diagnosis not present

## 2021-09-26 DIAGNOSIS — R609 Edema, unspecified: Secondary | ICD-10-CM | POA: Diagnosis not present

## 2021-09-26 DIAGNOSIS — J90 Pleural effusion, not elsewhere classified: Secondary | ICD-10-CM | POA: Diagnosis not present

## 2021-09-26 DIAGNOSIS — Z789 Other specified health status: Secondary | ICD-10-CM | POA: Diagnosis not present

## 2021-09-26 DIAGNOSIS — J9621 Acute and chronic respiratory failure with hypoxia: Secondary | ICD-10-CM | POA: Diagnosis not present

## 2021-09-26 DIAGNOSIS — I4892 Unspecified atrial flutter: Secondary | ICD-10-CM | POA: Diagnosis not present

## 2021-09-26 DIAGNOSIS — E875 Hyperkalemia: Secondary | ICD-10-CM | POA: Diagnosis not present

## 2021-09-26 DIAGNOSIS — I483 Typical atrial flutter: Secondary | ICD-10-CM | POA: Diagnosis not present

## 2021-09-26 DIAGNOSIS — E662 Morbid (severe) obesity with alveolar hypoventilation: Secondary | ICD-10-CM | POA: Diagnosis not present

## 2021-09-26 DIAGNOSIS — E1165 Type 2 diabetes mellitus with hyperglycemia: Secondary | ICD-10-CM | POA: Diagnosis not present

## 2021-09-26 DIAGNOSIS — G2581 Restless legs syndrome: Secondary | ICD-10-CM | POA: Diagnosis present

## 2021-09-26 DIAGNOSIS — Z89519 Acquired absence of unspecified leg below knee: Secondary | ICD-10-CM | POA: Diagnosis not present

## 2021-09-26 DIAGNOSIS — Z89421 Acquired absence of other right toe(s): Secondary | ICD-10-CM | POA: Diagnosis not present

## 2021-09-26 DIAGNOSIS — E1169 Type 2 diabetes mellitus with other specified complication: Secondary | ICD-10-CM | POA: Diagnosis not present

## 2021-09-26 DIAGNOSIS — J9622 Acute and chronic respiratory failure with hypercapnia: Secondary | ICD-10-CM | POA: Diagnosis not present

## 2021-09-26 DIAGNOSIS — E440 Moderate protein-calorie malnutrition: Secondary | ICD-10-CM | POA: Diagnosis not present

## 2021-09-26 DIAGNOSIS — R0602 Shortness of breath: Secondary | ICD-10-CM | POA: Diagnosis not present

## 2021-09-26 DIAGNOSIS — R531 Weakness: Secondary | ICD-10-CM | POA: Diagnosis not present

## 2021-09-26 DIAGNOSIS — J811 Chronic pulmonary edema: Secondary | ICD-10-CM | POA: Diagnosis not present

## 2021-09-26 DIAGNOSIS — F329 Major depressive disorder, single episode, unspecified: Secondary | ICD-10-CM | POA: Diagnosis not present

## 2021-09-26 DIAGNOSIS — R5381 Other malaise: Secondary | ICD-10-CM | POA: Diagnosis not present

## 2021-09-26 DIAGNOSIS — N390 Urinary tract infection, site not specified: Secondary | ICD-10-CM | POA: Diagnosis not present

## 2021-09-26 DIAGNOSIS — Z043 Encounter for examination and observation following other accident: Secondary | ICD-10-CM | POA: Diagnosis not present

## 2021-09-26 DIAGNOSIS — I25810 Atherosclerosis of coronary artery bypass graft(s) without angina pectoris: Secondary | ICD-10-CM | POA: Diagnosis not present

## 2021-09-26 DIAGNOSIS — R41841 Cognitive communication deficit: Secondary | ICD-10-CM | POA: Diagnosis not present

## 2021-09-26 DIAGNOSIS — B9562 Methicillin resistant Staphylococcus aureus infection as the cause of diseases classified elsewhere: Secondary | ICD-10-CM | POA: Diagnosis not present

## 2021-09-26 DIAGNOSIS — J9811 Atelectasis: Secondary | ICD-10-CM | POA: Diagnosis not present

## 2021-09-26 DIAGNOSIS — F05 Delirium due to known physiological condition: Secondary | ICD-10-CM | POA: Diagnosis not present

## 2021-09-26 LAB — BASIC METABOLIC PANEL
Anion gap: 11 (ref 5–15)
BUN: 30 mg/dL — ABNORMAL HIGH (ref 8–23)
CO2: 24 mmol/L (ref 22–32)
Calcium: 8.7 mg/dL — ABNORMAL LOW (ref 8.9–10.3)
Chloride: 101 mmol/L (ref 98–111)
Creatinine, Ser: 1.15 mg/dL (ref 0.61–1.24)
GFR, Estimated: >60 mL/min (ref >60)
Glucose, Bld: 305 mg/dL — ABNORMAL HIGH (ref 70–99)
Potassium: 5 mmol/L (ref 3.5–5.1)
Sodium: 136 mmol/L (ref 135–145)

## 2021-09-26 LAB — BRAIN NATRIURETIC PEPTIDE: B Natriuretic Peptide: 491.3 pg/mL — ABNORMAL HIGH (ref 0.0–100.0)

## 2021-09-26 MED ORDER — FUROSEMIDE 10 MG/ML IJ SOLN
INTRAMUSCULAR | Status: AC
  Administered 2021-09-26: 80 mg via INTRAVENOUS
  Filled 2021-09-26: qty 8

## 2021-09-26 MED ORDER — FUROSEMIDE 10 MG/ML IJ SOLN: 80.0000 mg | Freq: Once | INTRAMUSCULAR | Status: AC

## 2021-09-26 MED ORDER — POTASSIUM CHLORIDE CRYS ER 20 MEQ PO TBCR
EXTENDED_RELEASE_TABLET | ORAL | Status: AC
  Administered 2021-09-26: 40 meq via ORAL
  Filled 2021-09-26: qty 2

## 2021-09-26 MED ORDER — TORSEMIDE 20 MG PO TABS
60.0000 mg | ORAL_TABLET | Freq: Every day | ORAL | 3 refills | Status: DC
Start: 2021-09-26 — End: 2021-11-13

## 2021-09-26 MED ORDER — POTASSIUM CHLORIDE CRYS ER 20 MEQ PO TBCR: 40.0000 meq | EXTENDED_RELEASE_TABLET | Freq: Once | ORAL | Status: AC

## 2021-09-26 NOTE — Patient Instructions (Addendum)
Continue weighing daily and call for an overnight weight gain of 3 pounds or more or a weekly weight gain of more than 5 pounds.   If you have voicemail, please make sure your mailbox is cleaned out so that we may leave a message and please make sure to listen to any voicemails.    Stop taking furosemide and begin taking torsemide '60mg'$  daily. This will be 3 tablets every day.

## 2021-09-28 ENCOUNTER — Emergency Department: Payer: Medicare Other

## 2021-09-28 ENCOUNTER — Telehealth: Payer: Self-pay | Admitting: Family

## 2021-09-28 ENCOUNTER — Other Ambulatory Visit: Payer: Self-pay

## 2021-09-28 ENCOUNTER — Inpatient Hospital Stay: Payer: Medicare Other

## 2021-09-28 ENCOUNTER — Inpatient Hospital Stay
Admission: EM | Admit: 2021-09-28 | Discharge: 2021-11-13 | DRG: 853 | Disposition: A | Discharge status: Skilled Nursing Facility | Payer: Medicare Other | Attending: Internal Medicine | Admitting: Internal Medicine

## 2021-09-28 DIAGNOSIS — J9602 Acute respiratory failure with hypercapnia: Secondary | ICD-10-CM | POA: Diagnosis not present

## 2021-09-28 DIAGNOSIS — R338 Other retention of urine: Secondary | ICD-10-CM | POA: Clinically undetermined

## 2021-09-28 DIAGNOSIS — I130 Hypertensive heart and chronic kidney disease with heart failure and stage 1 through stage 4 chronic kidney disease, or unspecified chronic kidney disease: Secondary | ICD-10-CM | POA: Diagnosis present

## 2021-09-28 DIAGNOSIS — G4733 Obstructive sleep apnea (adult) (pediatric): Secondary | ICD-10-CM | POA: Diagnosis present

## 2021-09-28 DIAGNOSIS — M86172 Other acute osteomyelitis, left ankle and foot: Secondary | ICD-10-CM | POA: Diagnosis not present

## 2021-09-28 DIAGNOSIS — N390 Urinary tract infection, site not specified: Secondary | ICD-10-CM | POA: Diagnosis not present

## 2021-09-28 DIAGNOSIS — T502X5A Adverse effect of carbonic-anhydrase inhibitors, benzothiadiazides and other diuretics, initial encounter: Secondary | ICD-10-CM | POA: Diagnosis not present

## 2021-09-28 DIAGNOSIS — M868X7 Other osteomyelitis, ankle and foot: Secondary | ICD-10-CM | POA: Diagnosis present

## 2021-09-28 DIAGNOSIS — L97429 Non-pressure chronic ulcer of left heel and midfoot with unspecified severity: Secondary | ICD-10-CM | POA: Diagnosis not present

## 2021-09-28 DIAGNOSIS — J9601 Acute respiratory failure with hypoxia: Secondary | ICD-10-CM | POA: Diagnosis present

## 2021-09-28 DIAGNOSIS — M86672 Other chronic osteomyelitis, left ankle and foot: Secondary | ICD-10-CM | POA: Diagnosis not present

## 2021-09-28 DIAGNOSIS — F05 Delirium due to known physiological condition: Secondary | ICD-10-CM | POA: Diagnosis not present

## 2021-09-28 DIAGNOSIS — E874 Mixed disorder of acid-base balance: Secondary | ICD-10-CM | POA: Diagnosis not present

## 2021-09-28 DIAGNOSIS — N189 Chronic kidney disease, unspecified: Secondary | ICD-10-CM | POA: Diagnosis present

## 2021-09-28 DIAGNOSIS — M109 Gout, unspecified: Secondary | ICD-10-CM | POA: Diagnosis present

## 2021-09-28 DIAGNOSIS — Z89421 Acquired absence of other right toe(s): Secondary | ICD-10-CM

## 2021-09-28 DIAGNOSIS — N3001 Acute cystitis with hematuria: Secondary | ICD-10-CM | POA: Diagnosis not present

## 2021-09-28 DIAGNOSIS — E440 Moderate protein-calorie malnutrition: Secondary | ICD-10-CM | POA: Diagnosis present

## 2021-09-28 DIAGNOSIS — R531 Weakness: Secondary | ICD-10-CM

## 2021-09-28 DIAGNOSIS — I890 Lymphedema, not elsewhere classified: Secondary | ICD-10-CM | POA: Diagnosis present

## 2021-09-28 DIAGNOSIS — I252 Old myocardial infarction: Secondary | ICD-10-CM

## 2021-09-28 DIAGNOSIS — E785 Hyperlipidemia, unspecified: Secondary | ICD-10-CM | POA: Diagnosis present

## 2021-09-28 DIAGNOSIS — Z515 Encounter for palliative care: Secondary | ICD-10-CM | POA: Diagnosis not present

## 2021-09-28 DIAGNOSIS — R443 Hallucinations, unspecified: Secondary | ICD-10-CM | POA: Diagnosis not present

## 2021-09-28 DIAGNOSIS — Z7982 Long term (current) use of aspirin: Secondary | ICD-10-CM

## 2021-09-28 DIAGNOSIS — Z6841 Body Mass Index (BMI) 40.0 and over, adult: Secondary | ICD-10-CM | POA: Diagnosis not present

## 2021-09-28 DIAGNOSIS — Z7189 Other specified counseling: Secondary | ICD-10-CM | POA: Diagnosis not present

## 2021-09-28 DIAGNOSIS — G9340 Encephalopathy, unspecified: Secondary | ICD-10-CM | POA: Diagnosis not present

## 2021-09-28 DIAGNOSIS — I509 Heart failure, unspecified: Secondary | ICD-10-CM

## 2021-09-28 DIAGNOSIS — J9622 Acute and chronic respiratory failure with hypercapnia: Secondary | ICD-10-CM | POA: Diagnosis present

## 2021-09-28 DIAGNOSIS — I10 Essential (primary) hypertension: Secondary | ICD-10-CM | POA: Diagnosis not present

## 2021-09-28 DIAGNOSIS — Z79899 Other long term (current) drug therapy: Secondary | ICD-10-CM

## 2021-09-28 DIAGNOSIS — E11649 Type 2 diabetes mellitus with hypoglycemia without coma: Secondary | ICD-10-CM | POA: Diagnosis not present

## 2021-09-28 DIAGNOSIS — N400 Enlarged prostate without lower urinary tract symptoms: Secondary | ICD-10-CM | POA: Diagnosis present

## 2021-09-28 DIAGNOSIS — E1165 Type 2 diabetes mellitus with hyperglycemia: Secondary | ICD-10-CM | POA: Diagnosis not present

## 2021-09-28 DIAGNOSIS — G2581 Restless legs syndrome: Secondary | ICD-10-CM | POA: Diagnosis present

## 2021-09-28 DIAGNOSIS — Z7901 Long term (current) use of anticoagulants: Secondary | ICD-10-CM

## 2021-09-28 DIAGNOSIS — E875 Hyperkalemia: Secondary | ICD-10-CM | POA: Diagnosis not present

## 2021-09-28 DIAGNOSIS — E1169 Type 2 diabetes mellitus with other specified complication: Secondary | ICD-10-CM | POA: Diagnosis present

## 2021-09-28 DIAGNOSIS — J8281 Chronic eosinophilic pneumonia: Secondary | ICD-10-CM | POA: Diagnosis not present

## 2021-09-28 DIAGNOSIS — E11621 Type 2 diabetes mellitus with foot ulcer: Secondary | ICD-10-CM | POA: Diagnosis present

## 2021-09-28 DIAGNOSIS — D500 Iron deficiency anemia secondary to blood loss (chronic): Secondary | ICD-10-CM | POA: Diagnosis not present

## 2021-09-28 DIAGNOSIS — J9621 Acute and chronic respiratory failure with hypoxia: Secondary | ICD-10-CM | POA: Diagnosis present

## 2021-09-28 DIAGNOSIS — Z880 Allergy status to penicillin: Secondary | ICD-10-CM

## 2021-09-28 DIAGNOSIS — B9562 Methicillin resistant Staphylococcus aureus infection as the cause of diseases classified elsewhere: Secondary | ICD-10-CM | POA: Diagnosis not present

## 2021-09-28 DIAGNOSIS — M869 Osteomyelitis, unspecified: Secondary | ICD-10-CM | POA: Diagnosis present

## 2021-09-28 DIAGNOSIS — A4102 Sepsis due to Methicillin resistant Staphylococcus aureus: Principal | ICD-10-CM

## 2021-09-28 DIAGNOSIS — L89609 Pressure ulcer of unspecified heel, unspecified stage: Secondary | ICD-10-CM

## 2021-09-28 DIAGNOSIS — I452 Bifascicular block: Secondary | ICD-10-CM | POA: Diagnosis not present

## 2021-09-28 DIAGNOSIS — F329 Major depressive disorder, single episode, unspecified: Secondary | ICD-10-CM | POA: Diagnosis present

## 2021-09-28 DIAGNOSIS — Z789 Other specified health status: Secondary | ICD-10-CM | POA: Diagnosis not present

## 2021-09-28 DIAGNOSIS — L0390 Cellulitis, unspecified: Principal | ICD-10-CM

## 2021-09-28 DIAGNOSIS — E1122 Type 2 diabetes mellitus with diabetic chronic kidney disease: Secondary | ICD-10-CM | POA: Diagnosis present

## 2021-09-28 DIAGNOSIS — J9811 Atelectasis: Secondary | ICD-10-CM | POA: Diagnosis not present

## 2021-09-28 DIAGNOSIS — R410 Disorientation, unspecified: Secondary | ICD-10-CM | POA: Diagnosis not present

## 2021-09-28 DIAGNOSIS — G9341 Metabolic encephalopathy: Secondary | ICD-10-CM | POA: Diagnosis present

## 2021-09-28 DIAGNOSIS — D649 Anemia, unspecified: Secondary | ICD-10-CM | POA: Diagnosis present

## 2021-09-28 DIAGNOSIS — E1140 Type 2 diabetes mellitus with diabetic neuropathy, unspecified: Secondary | ICD-10-CM | POA: Diagnosis present

## 2021-09-28 DIAGNOSIS — M86272 Subacute osteomyelitis, left ankle and foot: Secondary | ICD-10-CM | POA: Diagnosis not present

## 2021-09-28 DIAGNOSIS — Z89519 Acquired absence of unspecified leg below knee: Secondary | ICD-10-CM | POA: Diagnosis not present

## 2021-09-28 DIAGNOSIS — R0902 Hypoxemia: Secondary | ICD-10-CM

## 2021-09-28 DIAGNOSIS — Z8249 Family history of ischemic heart disease and other diseases of the circulatory system: Secondary | ICD-10-CM

## 2021-09-28 DIAGNOSIS — I480 Paroxysmal atrial fibrillation: Secondary | ICD-10-CM | POA: Diagnosis present

## 2021-09-28 DIAGNOSIS — J440 Chronic obstructive pulmonary disease with acute lower respiratory infection: Secondary | ICD-10-CM | POA: Diagnosis not present

## 2021-09-28 DIAGNOSIS — I2510 Atherosclerotic heart disease of native coronary artery without angina pectoris: Secondary | ICD-10-CM | POA: Diagnosis present

## 2021-09-28 DIAGNOSIS — J449 Chronic obstructive pulmonary disease, unspecified: Secondary | ICD-10-CM | POA: Diagnosis present

## 2021-09-28 DIAGNOSIS — I4892 Unspecified atrial flutter: Secondary | ICD-10-CM | POA: Diagnosis not present

## 2021-09-28 DIAGNOSIS — Z23 Encounter for immunization: Secondary | ICD-10-CM

## 2021-09-28 DIAGNOSIS — Z7984 Long term (current) use of oral hypoglycemic drugs: Secondary | ICD-10-CM

## 2021-09-28 DIAGNOSIS — Z951 Presence of aortocoronary bypass graft: Secondary | ICD-10-CM

## 2021-09-28 DIAGNOSIS — E46 Unspecified protein-calorie malnutrition: Secondary | ICD-10-CM | POA: Diagnosis not present

## 2021-09-28 DIAGNOSIS — L97522 Non-pressure chronic ulcer of other part of left foot with fat layer exposed: Secondary | ICD-10-CM | POA: Diagnosis not present

## 2021-09-28 DIAGNOSIS — B9689 Other specified bacterial agents as the cause of diseases classified elsewhere: Secondary | ICD-10-CM | POA: Diagnosis not present

## 2021-09-28 DIAGNOSIS — I5033 Acute on chronic diastolic (congestive) heart failure: Secondary | ICD-10-CM | POA: Diagnosis present

## 2021-09-28 DIAGNOSIS — Z87891 Personal history of nicotine dependence: Secondary | ICD-10-CM

## 2021-09-28 DIAGNOSIS — D509 Iron deficiency anemia, unspecified: Secondary | ICD-10-CM | POA: Diagnosis present

## 2021-09-28 DIAGNOSIS — J90 Pleural effusion, not elsewhere classified: Secondary | ICD-10-CM | POA: Diagnosis present

## 2021-09-28 DIAGNOSIS — E662 Morbid (severe) obesity with alveolar hypoventilation: Secondary | ICD-10-CM | POA: Diagnosis present

## 2021-09-28 DIAGNOSIS — I70262 Atherosclerosis of native arteries of extremities with gangrene, left leg: Secondary | ICD-10-CM | POA: Diagnosis not present

## 2021-09-28 DIAGNOSIS — Z72820 Sleep deprivation: Secondary | ICD-10-CM

## 2021-09-28 DIAGNOSIS — I5032 Chronic diastolic (congestive) heart failure: Secondary | ICD-10-CM | POA: Diagnosis present

## 2021-09-28 DIAGNOSIS — Z89422 Acquired absence of other left toe(s): Secondary | ICD-10-CM

## 2021-09-28 DIAGNOSIS — L89620 Pressure ulcer of left heel, unstageable: Secondary | ICD-10-CM | POA: Diagnosis not present

## 2021-09-28 DIAGNOSIS — E66813 Obesity, class 3: Secondary | ICD-10-CM | POA: Diagnosis present

## 2021-09-28 DIAGNOSIS — Z794 Long term (current) use of insulin: Secondary | ICD-10-CM | POA: Diagnosis not present

## 2021-09-28 DIAGNOSIS — R000 Tachycardia, unspecified: Secondary | ICD-10-CM | POA: Diagnosis not present

## 2021-09-28 DIAGNOSIS — E1152 Type 2 diabetes mellitus with diabetic peripheral angiopathy with gangrene: Secondary | ICD-10-CM | POA: Diagnosis present

## 2021-09-28 DIAGNOSIS — R7881 Bacteremia: Secondary | ICD-10-CM | POA: Diagnosis not present

## 2021-09-28 DIAGNOSIS — L97422 Non-pressure chronic ulcer of left heel and midfoot with fat layer exposed: Secondary | ICD-10-CM | POA: Diagnosis present

## 2021-09-28 DIAGNOSIS — L089 Local infection of the skin and subcutaneous tissue, unspecified: Secondary | ICD-10-CM | POA: Diagnosis not present

## 2021-09-28 DIAGNOSIS — L97529 Non-pressure chronic ulcer of other part of left foot with unspecified severity: Secondary | ICD-10-CM | POA: Diagnosis present

## 2021-09-28 DIAGNOSIS — R6520 Severe sepsis without septic shock: Secondary | ICD-10-CM | POA: Diagnosis not present

## 2021-09-28 DIAGNOSIS — N179 Acute kidney failure, unspecified: Secondary | ICD-10-CM | POA: Diagnosis not present

## 2021-09-28 DIAGNOSIS — Z888 Allergy status to other drugs, medicaments and biological substances status: Secondary | ICD-10-CM

## 2021-09-28 DIAGNOSIS — Z955 Presence of coronary angioplasty implant and graft: Secondary | ICD-10-CM

## 2021-09-28 LAB — COMPREHENSIVE METABOLIC PANEL
ALT: 19 U/L (ref 0–44)
AST: 18 U/L (ref 15–41)
Albumin: 2.9 g/dL — ABNORMAL LOW (ref 3.5–5.0)
Alkaline Phosphatase: 187 U/L — ABNORMAL HIGH (ref 38–126)
Anion gap: 6 (ref 5–15)
BUN: 24 mg/dL — ABNORMAL HIGH (ref 8–23)
CO2: 32 mmol/L (ref 22–32)
Calcium: 8.5 mg/dL — ABNORMAL LOW (ref 8.9–10.3)
Chloride: 100 mmol/L (ref 98–111)
Creatinine, Ser: 1.01 mg/dL (ref 0.61–1.24)
GFR, Estimated: >60 mL/min (ref >60)
Glucose, Bld: 421 mg/dL — ABNORMAL HIGH (ref 70–99)
Potassium: 4.5 mmol/L (ref 3.5–5.1)
Sodium: 138 mmol/L (ref 135–145)
Total Bilirubin: 0.9 mg/dL (ref 0.3–1.2)
Total Protein: 7.1 g/dL (ref 6.5–8.1)

## 2021-09-28 LAB — CBC WITH DIFFERENTIAL/PLATELET
Abs Immature Granulocytes: 0.05 10*3/uL (ref 0.00–0.07)
Basophils Absolute: 0.1 10*3/uL (ref 0.0–0.1)
Basophils Relative: 1 %
Eosinophils Absolute: 0.1 10*3/uL (ref 0.0–0.5)
Eosinophils Relative: 1 %
HCT: 38.6 % — ABNORMAL LOW (ref 39.0–52.0)
Hemoglobin: 11.6 g/dL — ABNORMAL LOW (ref 13.0–17.0)
Immature Granulocytes: 1 %
Lymphocytes Relative: 7 %
Lymphs Abs: 0.7 10*3/uL (ref 0.7–4.0)
MCH: 27.4 pg (ref 26.0–34.0)
MCHC: 30.1 g/dL (ref 30.0–36.0)
MCV: 91.3 fL (ref 80.0–100.0)
Monocytes Absolute: 0.7 10*3/uL (ref 0.1–1.0)
Monocytes Relative: 7 %
Neutro Abs: 7.7 10*3/uL (ref 1.7–7.7)
Neutrophils Relative %: 83 %
Platelets: 274 10*3/uL (ref 150–400)
RBC: 4.23 MIL/uL (ref 4.22–5.81)
RDW: 16.5 % — ABNORMAL HIGH (ref 11.5–15.5)
WBC: 9.3 10*3/uL (ref 4.0–10.5)
nRBC: 0 % (ref 0.0–0.2)

## 2021-09-28 LAB — URINALYSIS, ROUTINE W REFLEX MICROSCOPIC
Bacteria, UA: NONE SEEN
Bilirubin Urine: NEGATIVE
Glucose, UA: >=500 mg/dL — AB
Hgb urine dipstick: NEGATIVE
Ketones, ur: NEGATIVE mg/dL
Leukocytes,Ua: NEGATIVE
Nitrite: NEGATIVE
Protein, ur: 30 mg/dL — AB
Specific Gravity, Urine: 1.006 (ref 1.005–1.030)
Squamous Epithelial / HPF: NONE SEEN (ref 0–5)
pH: 5 (ref 5.0–8.0)

## 2021-09-28 LAB — GLUCOSE, CAPILLARY: Glucose-Capillary: 389 mg/dL — ABNORMAL HIGH (ref 70–99)

## 2021-09-28 LAB — LACTIC ACID, PLASMA
Lactic Acid, Venous: 1.6 mmol/L (ref 0.5–1.9)
Lactic Acid, Venous: 1.2 mmol/L (ref 0.5–1.9)

## 2021-09-28 LAB — SEDIMENTATION RATE: Sed Rate: 45 mm/hr — ABNORMAL HIGH (ref 0–20)

## 2021-09-28 LAB — PROTIME-INR
INR: 1.3 — ABNORMAL HIGH (ref 0.8–1.2)
Prothrombin Time: 15.8 seconds — ABNORMAL HIGH (ref 11.4–15.2)

## 2021-09-28 MED ORDER — TAMSULOSIN HCL 0.4 MG PO CAPS
0.4000 mg | ORAL_CAPSULE | Freq: Every day | ORAL | Status: DC
Start: 2021-09-29 — End: 2021-10-08
  Administered 2021-09-29 – 2021-10-08 (×9): 0.4 mg via ORAL
  Filled 2021-09-28 (×9): qty 1

## 2021-09-28 MED ORDER — DILTIAZEM HCL ER COATED BEADS 180 MG PO CP24: 300.0000 mg | ORAL_CAPSULE | Freq: Every day | ORAL | Status: DC

## 2021-09-28 MED ORDER — LOSARTAN POTASSIUM 50 MG PO TABS
50.0000 mg | ORAL_TABLET | Freq: Every day | ORAL | Status: DC
  Administered 2021-09-29: 50 mg via ORAL
  Filled 2021-09-28: qty 1

## 2021-09-28 MED ORDER — ADULT MULTIVITAMIN W/MINERALS CH
1.0000 | ORAL_TABLET | Freq: Every day | ORAL | Status: DC
  Administered 2021-09-29 – 2021-11-13 (×45): 1 via ORAL
  Filled 2021-09-28 (×44): qty 1

## 2021-09-28 MED ORDER — PRAMIPEXOLE DIHYDROCHLORIDE 1 MG PO TABS
2.0000 mg | ORAL_TABLET | Freq: Every day | ORAL | Status: DC
  Administered 2021-09-28 – 2021-10-19 (×21): 2 mg via ORAL
  Filled 2021-09-28 (×22): qty 2

## 2021-09-28 MED ORDER — PRIMIDONE 50 MG PO TABS
250.0000 mg | ORAL_TABLET | Freq: Two times a day (BID) | ORAL | Status: DC
  Administered 2021-09-28 – 2021-11-13 (×89): 250 mg via ORAL
  Filled 2021-09-28 (×3): qty 1
  Filled 2021-09-28: qty 5
  Filled 2021-09-28 (×6): qty 1
  Filled 2021-09-28: qty 5
  Filled 2021-09-28 (×9): qty 1
  Filled 2021-09-28: qty 5
  Filled 2021-09-28 (×5): qty 1
  Filled 2021-09-28: qty 5
  Filled 2021-09-28 (×5): qty 1
  Filled 2021-09-28: qty 5
  Filled 2021-09-28 (×5): qty 1
  Filled 2021-09-28: qty 5
  Filled 2021-09-28 (×11): qty 1
  Filled 2021-09-28: qty 5
  Filled 2021-09-28 (×2): qty 1
  Filled 2021-09-28: qty 5
  Filled 2021-09-28 (×5): qty 1
  Filled 2021-09-28: qty 5
  Filled 2021-09-28 (×2): qty 1
  Filled 2021-09-28: qty 5
  Filled 2021-09-28 (×6): qty 1
  Filled 2021-09-28: qty 5
  Filled 2021-09-28 (×17): qty 1
  Filled 2021-09-28: qty 5
  Filled 2021-09-28 (×6): qty 1

## 2021-09-28 MED ORDER — ZINC SULFATE 220 (50 ZN) MG PO CAPS
220.0000 mg | ORAL_CAPSULE | Freq: Every day | ORAL | Status: AC
  Administered 2021-09-29 – 2021-10-12 (×13): 220 mg via ORAL
  Filled 2021-09-28 (×13): qty 1

## 2021-09-28 MED ORDER — PIPERACILLIN-TAZOBACTAM 3.375 G IVPB 30 MIN
3.3750 g | Freq: Once | INTRAVENOUS | Status: AC
  Administered 2021-09-28: 3.375 g via INTRAVENOUS
  Filled 2021-09-28: qty 50

## 2021-09-28 MED ORDER — ACETAMINOPHEN 650 MG RE SUPP: 650.0000 mg | Freq: Four times a day (QID) | RECTAL | Status: DC | PRN

## 2021-09-28 MED ORDER — INSULIN GLARGINE-YFGN 100 UNIT/ML ~~LOC~~ SOLN
25.0000 [IU] | Freq: Two times a day (BID) | SUBCUTANEOUS | Status: DC
  Administered 2021-09-28 – 2021-10-06 (×15): 25 [IU] via SUBCUTANEOUS
  Filled 2021-09-28 (×17): qty 0.25

## 2021-09-28 MED ORDER — METOPROLOL TARTRATE 50 MG PO TABS
100.0000 mg | ORAL_TABLET | Freq: Two times a day (BID) | ORAL | Status: DC
  Administered 2021-09-28 – 2021-10-08 (×18): 100 mg via ORAL
  Filled 2021-09-28 (×18): qty 2

## 2021-09-28 MED ORDER — ASCORBIC ACID 500 MG PO TABS
500.0000 mg | ORAL_TABLET | Freq: Every day | ORAL | Status: DC
  Administered 2021-09-29 – 2021-11-13 (×45): 500 mg via ORAL
  Filled 2021-09-28 (×46): qty 1

## 2021-09-28 MED ORDER — METRONIDAZOLE 500 MG/100ML IV SOLN
500.0000 mg | Freq: Two times a day (BID) | INTRAVENOUS | Status: DC
  Administered 2021-09-29 – 2021-10-03 (×10): 500 mg via INTRAVENOUS
  Filled 2021-09-28 (×12): qty 100

## 2021-09-28 MED ORDER — INSULIN ASPART 100 UNIT/ML IJ SOLN
20.0000 [IU] | Freq: Once | INTRAMUSCULAR | Status: AC
  Administered 2021-09-28: 20 [IU] via SUBCUTANEOUS
  Filled 2021-09-28: qty 1

## 2021-09-28 MED ORDER — FUROSEMIDE 10 MG/ML IJ SOLN
60.0000 mg | Freq: Once | INTRAMUSCULAR | Status: AC
  Administered 2021-09-28: 60 mg via INTRAVENOUS
  Filled 2021-09-28: qty 8

## 2021-09-28 MED ORDER — TRAMADOL HCL 50 MG PO TABS: 50.0000 mg | ORAL_TABLET | Freq: Every day | ORAL | Status: DC | PRN

## 2021-09-28 MED ORDER — AMIODARONE HCL 200 MG PO TABS
200.0000 mg | ORAL_TABLET | Freq: Every day | ORAL | Status: DC
  Administered 2021-09-29 – 2021-10-05 (×6): 200 mg via ORAL
  Filled 2021-09-28 (×6): qty 1

## 2021-09-28 MED ORDER — VITAMIN D 25 MCG (1000 UNIT) PO TABS
1000.0000 [IU] | ORAL_TABLET | Freq: Every day | ORAL | Status: DC
  Administered 2021-09-29 – 2021-11-13 (×45): 1000 [IU] via ORAL
  Filled 2021-09-28 (×45): qty 1

## 2021-09-28 MED ORDER — NITROGLYCERIN 0.4 MG SL SUBL
0.4000 mg | SUBLINGUAL_TABLET | SUBLINGUAL | Status: DC | PRN
Start: 2021-09-28 — End: 2021-11-13

## 2021-09-28 MED ORDER — ONDANSETRON HCL 4 MG PO TABS: 4.0000 mg | ORAL_TABLET | Freq: Four times a day (QID) | ORAL | Status: DC | PRN

## 2021-09-28 MED ORDER — GABAPENTIN 300 MG PO CAPS
300.0000 mg | ORAL_CAPSULE | Freq: Every day | ORAL | Status: DC
  Administered 2021-09-28 – 2021-10-19 (×21): 300 mg via ORAL
  Filled 2021-09-28 (×21): qty 1

## 2021-09-28 MED ORDER — EZETIMIBE 10 MG PO TABS
10.0000 mg | ORAL_TABLET | Freq: Every day | ORAL | Status: DC
  Administered 2021-09-29 – 2021-11-13 (×44): 10 mg via ORAL
  Filled 2021-09-28 (×46): qty 1

## 2021-09-28 MED ORDER — VITAMIN B-12 1000 MCG PO TABS
1000.0000 ug | ORAL_TABLET | Freq: Every day | ORAL | Status: DC
  Administered 2021-09-29 – 2021-11-13 (×44): 1000 ug via ORAL
  Filled 2021-09-28 (×45): qty 1

## 2021-09-28 MED ORDER — VANCOMYCIN HCL IN DEXTROSE 1-5 GM/200ML-% IV SOLN
1000.0000 mg | Freq: Once | INTRAVENOUS | Status: AC
Start: 2021-09-28 — End: 2021-09-28
  Administered 2021-09-28: 1000 mg via INTRAVENOUS
  Filled 2021-09-28: qty 200

## 2021-09-28 MED ORDER — FUROSEMIDE 10 MG/ML IJ SOLN
40.0000 mg | Freq: Two times a day (BID) | INTRAMUSCULAR | Status: DC
  Administered 2021-09-29: 40 mg via INTRAVENOUS
  Filled 2021-09-28: qty 4

## 2021-09-28 MED ORDER — ACETAMINOPHEN 325 MG PO TABS
650.0000 mg | ORAL_TABLET | Freq: Four times a day (QID) | ORAL | Status: DC | PRN
  Administered 2021-09-28 – 2021-11-04 (×7): 650 mg via ORAL
  Filled 2021-09-28 (×7): qty 2

## 2021-09-28 MED ORDER — ONDANSETRON HCL 4 MG/2ML IJ SOLN
4.0000 mg | Freq: Four times a day (QID) | INTRAMUSCULAR | Status: DC | PRN
  Administered 2021-11-10: 4 mg via INTRAVENOUS
  Filled 2021-09-28: qty 2

## 2021-09-28 MED ORDER — SODIUM CHLORIDE 0.9 % IV SOLN
2.0000 g | INTRAVENOUS | Status: DC
  Administered 2021-09-29: 2 g via INTRAVENOUS
  Filled 2021-09-28 (×2): qty 20

## 2021-09-28 MED ORDER — FERROUS SULFATE 325 (65 FE) MG PO TABS
325.0000 mg | ORAL_TABLET | Freq: Every day | ORAL | Status: DC
  Administered 2021-09-29 – 2021-11-13 (×45): 325 mg via ORAL
  Filled 2021-09-28 (×44): qty 1

## 2021-09-28 MED ORDER — ISOSORBIDE MONONITRATE ER 60 MG PO TB24: 60.0000 mg | ORAL_TABLET | Freq: Two times a day (BID) | ORAL | Status: DC

## 2021-09-28 MED ORDER — FUROSEMIDE 10 MG/ML IJ SOLN: 40.0000 mg | Freq: Two times a day (BID) | INTRAMUSCULAR | Status: DC

## 2021-09-28 MED ORDER — INSULIN ASPART 100 UNIT/ML IJ SOLN
0.0000 [IU] | Freq: Three times a day (TID) | INTRAMUSCULAR | Status: DC
  Administered 2021-09-29: 7 [IU] via SUBCUTANEOUS
  Administered 2021-09-29: 4 [IU] via SUBCUTANEOUS
  Administered 2021-09-29 – 2021-09-30 (×3): 7 [IU] via SUBCUTANEOUS
  Administered 2021-09-30: 4 [IU] via SUBCUTANEOUS
  Administered 2021-10-01 (×2): 11 [IU] via SUBCUTANEOUS
  Administered 2021-10-01: 4 [IU] via SUBCUTANEOUS
  Administered 2021-10-02: 3 [IU] via SUBCUTANEOUS
  Administered 2021-10-02: 11 [IU] via SUBCUTANEOUS
  Administered 2021-10-02 – 2021-10-04 (×4): 4 [IU] via SUBCUTANEOUS
  Administered 2021-10-05: 11 [IU] via SUBCUTANEOUS
  Administered 2021-10-05 (×2): 7 [IU] via SUBCUTANEOUS
  Administered 2021-10-06: 4 [IU] via SUBCUTANEOUS
  Administered 2021-10-07: 3 [IU] via SUBCUTANEOUS
  Administered 2021-10-07 (×2): 7 [IU] via SUBCUTANEOUS
  Administered 2021-10-08: 11 [IU] via SUBCUTANEOUS
  Administered 2021-10-08: 7 [IU] via SUBCUTANEOUS
  Administered 2021-10-08 – 2021-10-09 (×2): 4 [IU] via SUBCUTANEOUS
  Administered 2021-10-09: 7 [IU] via SUBCUTANEOUS
  Administered 2021-10-09: 11 [IU] via SUBCUTANEOUS
  Administered 2021-10-10 – 2021-10-12 (×5): 7 [IU] via SUBCUTANEOUS
  Administered 2021-10-12: 4 [IU] via SUBCUTANEOUS
  Administered 2021-10-13: 3 [IU] via SUBCUTANEOUS
  Administered 2021-10-13: 7 [IU] via SUBCUTANEOUS
  Administered 2021-10-13: 4 [IU] via SUBCUTANEOUS
  Administered 2021-10-14: 3 [IU] via SUBCUTANEOUS
  Administered 2021-10-14: 11 [IU] via SUBCUTANEOUS
  Administered 2021-10-14 – 2021-10-15 (×3): 7 [IU] via SUBCUTANEOUS
  Administered 2021-10-16 (×2): 4 [IU] via SUBCUTANEOUS
  Administered 2021-10-16: 7 [IU] via SUBCUTANEOUS
  Administered 2021-10-17 (×2): 4 [IU] via SUBCUTANEOUS
  Administered 2021-10-18: 3 [IU] via SUBCUTANEOUS
  Administered 2021-10-18: 11 [IU] via SUBCUTANEOUS
  Administered 2021-10-19 (×2): 3 [IU] via SUBCUTANEOUS
  Administered 2021-10-19: 7 [IU] via SUBCUTANEOUS
  Administered 2021-10-20: 3 [IU] via SUBCUTANEOUS
  Administered 2021-10-20: 4 [IU] via SUBCUTANEOUS
  Administered 2021-10-21: 7 [IU] via SUBCUTANEOUS
  Administered 2021-10-21: 3 [IU] via SUBCUTANEOUS
  Administered 2021-10-22 (×2): 7 [IU] via SUBCUTANEOUS
  Administered 2021-10-22 – 2021-10-23 (×2): 4 [IU] via SUBCUTANEOUS
  Administered 2021-10-23 (×2): 3 [IU] via SUBCUTANEOUS
  Administered 2021-10-24: 15 [IU] via SUBCUTANEOUS
  Administered 2021-10-24: 11 [IU] via SUBCUTANEOUS
  Administered 2021-10-24: 3 [IU] via SUBCUTANEOUS
  Administered 2021-10-25: 4 [IU] via SUBCUTANEOUS
  Administered 2021-10-25: 7 [IU] via SUBCUTANEOUS
  Administered 2021-10-25: 20 [IU] via SUBCUTANEOUS
  Administered 2021-10-26: 7 [IU] via SUBCUTANEOUS
  Administered 2021-10-26: 15 [IU] via SUBCUTANEOUS
  Administered 2021-10-26: 11 [IU] via SUBCUTANEOUS
  Administered 2021-10-27: 7 [IU] via SUBCUTANEOUS
  Administered 2021-10-27: 3 [IU] via SUBCUTANEOUS
  Administered 2021-10-27: 11 [IU] via SUBCUTANEOUS
  Administered 2021-10-28: 4 [IU] via SUBCUTANEOUS
  Administered 2021-10-28: 11 [IU] via SUBCUTANEOUS
  Administered 2021-10-28: 4 [IU] via SUBCUTANEOUS
  Administered 2021-10-29: 3 [IU] via SUBCUTANEOUS
  Administered 2021-10-29 (×2): 4 [IU] via SUBCUTANEOUS
  Administered 2021-10-30: 3 [IU] via SUBCUTANEOUS
  Administered 2021-10-30 – 2021-11-01 (×5): 4 [IU] via SUBCUTANEOUS
  Administered 2021-11-01 – 2021-11-02 (×2): 3 [IU] via SUBCUTANEOUS
  Administered 2021-11-02 (×2): 4 [IU] via SUBCUTANEOUS
  Administered 2021-11-03: 7 [IU] via SUBCUTANEOUS
  Administered 2021-11-03: 4 [IU] via SUBCUTANEOUS
  Administered 2021-11-03: 3 [IU] via SUBCUTANEOUS
  Administered 2021-11-04: 7 [IU] via SUBCUTANEOUS
  Administered 2021-11-04 – 2021-11-06 (×6): 4 [IU] via SUBCUTANEOUS
  Administered 2021-11-06: 15 [IU] via SUBCUTANEOUS
  Administered 2021-11-07: 7 [IU] via SUBCUTANEOUS
  Administered 2021-11-07 (×2): 4 [IU] via SUBCUTANEOUS
  Administered 2021-11-08: 3 [IU] via SUBCUTANEOUS
  Administered 2021-11-08: 4 [IU] via SUBCUTANEOUS
  Administered 2021-11-09 (×2): 7 [IU] via SUBCUTANEOUS
  Administered 2021-11-09: 3 [IU] via SUBCUTANEOUS
  Administered 2021-11-10: 4 [IU] via SUBCUTANEOUS
  Administered 2021-11-10: 7 [IU] via SUBCUTANEOUS
  Administered 2021-11-10 – 2021-11-11 (×2): 4 [IU] via SUBCUTANEOUS
  Administered 2021-11-11: 7 [IU] via SUBCUTANEOUS
  Administered 2021-11-11: 4 [IU] via SUBCUTANEOUS
  Administered 2021-11-12: 3 [IU] via SUBCUTANEOUS
  Administered 2021-11-12 (×2): 4 [IU] via SUBCUTANEOUS
  Administered 2021-11-13: 7 [IU] via SUBCUTANEOUS
  Administered 2021-11-13: 3 [IU] via SUBCUTANEOUS
  Filled 2021-09-28 (×115): qty 1

## 2021-09-28 MED ORDER — APIXABAN 5 MG PO TABS
5.0000 mg | ORAL_TABLET | Freq: Two times a day (BID) | ORAL | Status: DC
  Administered 2021-09-28 – 2021-09-30 (×4): 5 mg via ORAL
  Filled 2021-09-28 (×4): qty 1

## 2021-09-28 MED ORDER — ASPIRIN 81 MG PO TBEC
81.0000 mg | DELAYED_RELEASE_TABLET | Freq: Every day | ORAL | Status: DC
  Administered 2021-09-29 – 2021-11-13 (×45): 81 mg via ORAL
  Filled 2021-09-28 (×45): qty 1

## 2021-09-28 NOTE — Assessment & Plan Note (Addendum)
Patient's last known LVEF is 55 to 60% from a 2D echocardiogram which was done 01/23.  -We will change back to IV Lasix 60 mg IV twice daily secondary to respiratory status

## 2021-09-28 NOTE — ED Notes (Signed)
This Probation officer transferred pt via stretcher from triage to room 6. Pt was requesting for a urinal due to being in lasix. This tech offered pt with a male purewick as pt gets sob with minimal movement, pt's wife refused right away, "that does not work on him." Pt then requested for a hospital gown and to assist him remove his short. This Probation officer asked pt if the nurse and this writer could help him sit up on the side of the bed so he could use the urinal and remove his short, pt's wife stated, "I don't think he can't." Pt agreed to sit up on the side of the bed w/ assist of RN and this Probation officer.  While assisting pt to get back in bed by helping lifting his legs. Pt's wife raised her voice at this writer, "be careful be careful." This Probation officer affirmed wife that this Probation officer was aware of wound bag location. Pt's wife yelled a this staff, "well I was just letting you know." This writer continue to take care of the pt, provide quality of care, and make sure pt had no other needs found at the moment.  Pt was placed on EKG.  Call light at reach.

## 2021-09-28 NOTE — ED Triage Notes (Signed)
Pt comes into the ED via EMS from home with c/o BL LE , left worse with wound vac, worsening redness and swelling   153/88 HR125 97%RA Temp 97.6 [id number]

## 2021-09-28 NOTE — Assessment & Plan Note (Addendum)
Continue metoprolol, Cardizem and torsemide

## 2021-09-28 NOTE — Assessment & Plan Note (Addendum)
Status post left BKA on 10/03/2021. Patient on IV daptomycin.

## 2021-09-28 NOTE — Assessment & Plan Note (Deleted)
Treatment as outlined in 1 

## 2021-09-28 NOTE — Assessment & Plan Note (Addendum)
BMI 42.19 Complicates overall care and prognosis.  Recommend lifestyle modifications including physical activity and diet for weight loss and overall long-term health.

## 2021-09-28 NOTE — ED Notes (Signed)
Pt placed on 4L BNC to bring pt saturation up to 94%.

## 2021-09-28 NOTE — Assessment & Plan Note (Addendum)
Hold Mirapex.  Lower dose gabapentin at night

## 2021-09-28 NOTE — ED Notes (Signed)
Dr. Francine Graven at the bedside.

## 2021-09-28 NOTE — ED Provider Notes (Signed)
[facility] Provider Note    Event Date/Time   First MD Initiated Contact with Patient 09/28/21 1659     (approximate)   History   Wound Infection   HPI  [name]. is a 66 y.o. male  who, per vascular surgery office note dated 09/12/21 had left foot cellulitis with early osteomyelitis resulting in wound debridement and partial calcanectomy who presents to the emergency department today because of concern for possible cellulitis of the left leg.  The patient has a somewhat long standing history of issues with his left foot and leg.  Wife states that he was in the hospital a couple of months ago and had to have part of his heel removed secondary to infection.  She states that he had been doing well with his infection and has been taking the antibiotics as prescribed however noticed today increasing pain to this left leg as well as redness.  In addition the patient has been having some increased swelling.  Did go to heart failure clinic 2 days ago and was given IV Lasix. Is not on oxygen at home, but uses CPAP at night.     Physical Exam   Triage Vital Signs: ED Triage Vitals  Enc Vitals Group     BP 09/28/21 1345 136/76     Pulse Rate 09/28/21 1345 (!) 122     Resp 09/28/21 1357 20     Temp 09/28/21 1345 99 F (37.2 C)     Temp Source 09/28/21 1345 Oral     SpO2 09/28/21 1345 (!) 85 %   Most recent vital signs: Vitals:   09/28/21 1357 09/28/21 1651  BP:  109/70  Pulse:  (!) 120  Resp: 20 (!) 21  Temp:    SpO2:  98%    General: Awake, alert, oriented. CV:  Good peripheral perfusion. Tachycardia. Resp:  Normal effort. Increased respiratory effort. Abd:  No distention.  Other:  Bilateral lower extremity edema. Left leg with erythema and warmth.    ED Results / Procedures / Treatments   Labs (all labs ordered are listed, but only abnormal results are displayed) Labs Reviewed  COMPREHENSIVE METABOLIC PANEL - Abnormal; Notable for the  following components:      Result Value   Glucose, Bld 421 (*)    BUN 24 (*)    Calcium 8.5 (*)    Albumin 2.9 (*)    Alkaline Phosphatase 187 (*)    All other components within normal limits  CBC WITH DIFFERENTIAL/PLATELET - Abnormal; Notable for the following components:   Hemoglobin 11.6 (*)    HCT 38.6 (*)    RDW 16.5 (*)    All other components within normal limits  PROTIME-INR - Abnormal; Notable for the following components:   Prothrombin Time 15.8 (*)    INR 1.3 (*)    All other components within normal limits  URINALYSIS, ROUTINE W REFLEX MICROSCOPIC - Abnormal; Notable for the following components:   Color, Urine STRAW (*)    APPearance CLEAR (*)    Glucose, UA >=500 (*)    Protein, ur 30 (*)    All other components within normal limits  CULTURE, BLOOD (ROUTINE X 2)  CULTURE, BLOOD (ROUTINE X 2)  LACTIC ACID, PLASMA  LACTIC ACID, PLASMA     EKG  None   RADIOLOGY I independently interpreted and visualized the CXR. My interpretation: No pneumonia or pneumothorax.  Radiology interpretation:  IMPRESSION:  Findings suggestive of CHF with pulmonary  edema.   I independently interpreted and visualized the left foot. My interpretation: Erosion of calcaneous Radiology interpretation:  IMPRESSION:  1. Soft tissue ulceration in the plantar heel with gas and swelling  extending to the osseous surface and cortical irregularity along the  inferior aspect of the calcaneus, concerning for osteomyelitis.  2. Prior transmetatarsal amputation of the third digit and distal  fifth metatarsal.      PROCEDURES:  Critical Care performed: No  Procedures   MEDICATIONS ORDERED IN ED: Medications - No data to display   IMPRESSION / MDM / [place] / ED COURSE  I reviewed the triage vital signs and the nursing notes.                              Differential diagnosis includes, but is not limited to, cellulitis, CHF, osteomyelitis.   Patient's presentation  is most consistent with acute presentation with potential threat to life or bodily function.  Patient presented to the emergency department today because of concerns for erythema, warmth and discomfort to the left lower leg.  Additionally patient is concerned for fluid overload.  On exam patient does have significant erythema and swelling to the left lower leg.  It is warm.  I do think this is consistent with cellulitis.  Given her recent history of admission for osteomyelitis of did obtain a left foot x-ray which is concerning for osteomyelitis of the calcaneus.  Discussed with Dr. Cleda Mccreedy with podiatry.  Additionally the patient was found to be hypoxic here on room air.  Given this finding did have concern for fluid overload.  Chest x-ray is consistent with pulmonary edema.  Patient was given IV Lasix.  He was placed on oxygen.  Discussed with Dr. Francine Graven with the hospitalist service will plan on admission.   FINAL CLINICAL IMPRESSION(S) / ED DIAGNOSES   Final diagnoses:  Cellulitis, unspecified cellulitis site  Osteomyelitis, unspecified site, unspecified type ([place])  Acute on chronic congestive heart failure, unspecified heart failure type ([place])  Hypoxia      Note:  This document was prepared using Dragon voice recognition software and may include unintentional dictation errors.    Nance Pear, MD 09/28/21 2005

## 2021-09-28 NOTE — Assessment & Plan Note (Addendum)
Continue amiodarone.  Eliquis for anticoagulation.   Due to urinary retention, decreased Cardizem CD down to 240 mg daily, increased metoprolol 150 mg twice daily.    6/15: remains in A-flutter with HR's in 100's

## 2021-09-28 NOTE — H&P (Signed)
History and Physical    Patient: [name]. NTI:[id number] DOB: 03-15-1956 DOA: 09/28/2021 DOS: the patient was seen and examined on 09/28/2021 PCP: [name], MD  Patient coming from: Home  Chief Complaint:  Chief Complaint  Patient presents with   Wound Infection   HPI: Alec Less. is a 66 y.o. male with medical history significant for chronic diastolic dysfunction CHF with last known LVEF of 50 to 55%, hypertension, coronary artery disease, depression, COPD, A-fib, morbid obesity (BMI 59), obstructive sleep apnea, status post recent wound debridement and partial calcanectomy (05/16) currently on oral antibiotic therapy after completion of IV who presents to the ER for evaluation of bilateral lower extremity swelling as well as increased redness and swelling involving the left lower extremity mostly over the heel. He denies having any fever or chills but notes increased redness involving his left heel where he has a chronic wound for which he is followed up at the wound clinic. He notes worsening shortness of breath over the last 1 week associated with orthopnea and was recently started on torsemide. He denies having any chest pain, no cough, no fever, no chills, no headache, no nausea, no vomiting, no dizziness, no lightheadedness, no abdominal pain, no urinary symptoms, no changes in his bowel habits, no blurred vision no focal deficit.  Review of Systems: As mentioned in the history of present illness. All other systems reviewed and are negative. Past Medical History:  Diagnosis Date   Arrhythmia    atrial fibrillation   Asthma    CHF (congestive heart failure) (HCC)    COPD (chronic obstructive pulmonary disease) (HCC)    Coronary artery disease    Depression    Diabetes mellitus without complication (HCC)    Gout    History anabolic steroid use    Hyperlipidemia    Hypertension    Hypogonadism in male    Morbid obesity ([place])    Myocardial  infarction ([place])    Peripheral vascular disease ([place])    Perirectal abscess    Pleurisy    Sleep apnea    CPAP at night, no oxygen   Varicella    Past Surgical History:  Procedure Laterality Date   ABDOMINAL AORTIC ANEURYSM REPAIR     ACHILLES TENDON SURGERY Left 01/10/2021   Procedure: ACHILLES LENGTHENING/KIDNER;  Surgeon: Caroline More, DPM;  Location: [facility];  Service: Podiatry;  Laterality: Left;   AMPUTATION TOE Right 02/10/2016   Procedure: AMPUTATION TOE 3RD TOE;  Surgeon: Samara Deist, DPM;  Location: [facility];  Service: Podiatry;  Laterality: Right;   AMPUTATION TOE Left 02/24/2020   Procedure: AMPUTATION TOE;  Surgeon: Caroline More, DPM;  Location: [facility];  Service: Podiatry;  Laterality: Left;   APPLICATION OF WOUND VAC Left 02/29/2020   Procedure: APPLICATION OF WOUND VAC;  Surgeon: Caroline More, DPM;  Location: [facility];  Service: Podiatry;  Laterality: Left;   COLONOSCOPY WITH PROPOFOL [name] 11/18/2015   Procedure: COLONOSCOPY WITH PROPOFOL;  Surgeon: Manya Silvas, MD;  Location: [facility]OSCOPY;  Service: Endoscopy;  Laterality: [name];   CORONARY ARTERY BYPASS GRAFT     CORONARY STENT INTERVENTION [name] 02/02/2020   Procedure: CORONARY STENT INTERVENTION;  Surgeon: Isaias Cowman, MD;  Location: [place] [facility];  Service: Cardiovascular;  Laterality: [name];   INCISION AND DRAINAGE Left 08/07/2021   Procedure: INCISION AND DRAINAGE-Partial Calcanectomy;  Surgeon: Caroline More, DPM;  Location: [facility];  Service: Podiatry;  Laterality: Left;   IRRIGATION AND DEBRIDEMENT  FOOT Left 02/29/2020   Procedure: IRRIGATION AND DEBRIDEMENT FOOT;  Surgeon: Caroline More, DPM;  Location: [facility];  Service: Podiatry;  Laterality: Left;   IRRIGATION AND DEBRIDEMENT FOOT Left 02/24/2020   Procedure: IRRIGATION AND DEBRIDEMENT FOOT;  Surgeon: Caroline More, DPM;  Location: [facility];  Service: Podiatry;  Laterality: Left;   KNEE ARTHROSCOPY     LEFT HEART CATH AND CORS/GRAFTS  ANGIOGRAPHY [name] 02/02/2020   Procedure: LEFT HEART CATH AND CORS/GRAFTS ANGIOGRAPHY;  Surgeon: Teodoro Spray, MD;  Location: [place] [facility];  Service: Cardiovascular;  Laterality: [name];   LOWER EXTREMITY ANGIOGRAPHY Left 02/25/2020   Procedure: Lower Extremity Angiography;  Surgeon: Algernon Huxley, MD;  Location: [place] [facility];  Service: Cardiovascular;  Laterality: Left;   LOWER EXTREMITY ANGIOGRAPHY Left 01/04/2021   Procedure: LOWER EXTREMITY ANGIOGRAPHY;  Surgeon: Algernon Huxley, MD;  Location: [place] [facility];  Service: Cardiovascular;  Laterality: Left;   METATARSAL HEAD EXCISION Left 01/10/2021   Procedure: METATARSAL HEAD EXCISION - LEFT 5th;  Surgeon: Caroline More, DPM;  Location: [facility];  Service: Podiatry;  Laterality: Left;   PERIPHERAL VASCULAR CATHETERIZATION Right 01/24/2016   Procedure: Lower Extremity Angiography;  Surgeon: Katha Cabal, MD;  Location: [place] [facility];  Service: Cardiovascular;  Laterality: Right;   PERIPHERAL VASCULAR CATHETERIZATION Right 01/25/2016   Procedure: Lower Extremity Angiography;  Surgeon: Katha Cabal, MD;  Location: [place] [facility];  Service: Cardiovascular;  Laterality: Right;   TOE AMPUTATION     TONSILLECTOMY     Social History:  reports that he quit smoking about 6 years ago. His smoking use included cigarettes. He has a 22.50 pack-year smoking history. He has never used smokeless tobacco. He reports that he does not currently use alcohol after a past usage of about 3.0 standard drinks per week. He reports that he does not use drugs.  Allergies  Allergen Reactions   Penicillins Other (See Comments)    Happened at 66 years old and pt. stated he passed out  He has tolerated amoxicillin/clavulanate and ampicillin/sulbactam   Statins     Other reaction(s): Muscle Pain Causes legs to ache per pt    Family History  Problem Relation Age of Onset   Hypertension Father    Coronary artery disease Father     Alcohol abuse Father    Heart failure Brother     Prior to Admission medications   Medication Sig Start Date End Date Taking? Authorizing Provider  amiodarone (PACERONE) 200 MG tablet Take 200 mg by mouth daily.   Yes [provider]  amoxicillin-clavulanate (AUGMENTIN) 875-125 MG tablet Take 1 tablet by mouth 2 (two) times daily. 09/18/21  Yes Tsosie Billing, MD  apixaban (ELIQUIS) 5 MG TABS tablet Take 1 tablet (5 mg total) by mouth 2 (two) times daily. 01/26/16  Yes Stegmayer, Janalyn Harder, PA-C  aspirin EC 81 MG EC tablet Take 1 tablet (81 mg total) by mouth daily. Swallow whole. 05/17/21  Yes Dessa Phi, DO  Cholecalciferol 25 MCG (1000 UT) tablet Take 1,000 Units by mouth daily.   Yes [provider]  Cyanocobalamin (VITAMIN B-12) 5000 MCG TBDP Take 10,000 mcg by mouth daily.   Yes [provider]  diltiazem (CARDIZEM CD) 300 MG 24 hr capsule Take 1 capsule (300 mg total) by mouth daily. 01/16/21  Yes Bonnielee Haff, MD  ezetimibe (ZETIA) 10 MG tablet Take 10 mg by mouth at bedtime.   Yes [provider]  ferrous  sulfate 325 (65 FE) MG tablet Take 325 mg by mouth daily with breakfast.   Yes [provider]  gabapentin (NEURONTIN) 300 MG capsule Take 300 mg by mouth at bedtime. 12/02/20  Yes [provider]  HUMALOG 100 UNIT/ML injection Inject 15 Units into the skin in the morning and at bedtime. Sliding scale 08/18/21  Yes [provider]  insulin glargine (LANTUS) 100 UNIT/ML Solostar Pen Inject 25 Units into the skin in the morning and at bedtime. 08/11/21  Yes Fritzi Mandes, MD  isosorbide mononitrate (IMDUR) 60 MG 24 hr tablet Take 60 mg by mouth 2 (two) times daily. 04/21/21  Yes [provider]  losartan (COZAAR) 50 MG tablet Take 50 mg by mouth daily.   Yes [provider]  metFORMIN (GLUCOPHAGE) 1000 MG tablet Take 1,000 mg by mouth 2 (two) times daily.  12/02/17  Yes [provider]   metoprolol tartrate (LOPRESSOR) 50 MG tablet Take 100 mg by mouth 2 (two) times daily. 04/21/21  Yes [provider]  Multiple Vitamins-Minerals (MULTIVITAMIN WITH MINERALS) tablet Take 1 tablet by mouth daily.   Yes [provider]  nitroGLYCERIN (NITROSTAT) 0.4 MG SL tablet Place 0.4 mg under the tongue every 5 (five) minutes as needed for chest pain.   Yes [provider]  pramipexole (MIRAPEX) 1 MG tablet Take 2 mg by mouth at bedtime. 11/09/20  Yes [provider]  primidone (MYSOLINE) 250 MG tablet Take 250 mg by mouth 2 (two) times daily. 01/23/20  Yes [provider]  tamsulosin (FLOMAX) 0.4 MG CAPS capsule Take 0.4 mg by mouth daily.   Yes [provider]  torsemide (DEMADEX) 20 MG tablet Take 3 tablets (60 mg total) by mouth daily. 09/26/21  Yes Hackney, Otila Kluver A, FNP  traMADol (ULTRAM) 50 MG tablet Take 1 tablet (50 mg total) by mouth daily as needed for moderate pain. 08/11/21  Yes Fritzi Mandes, MD  vitamin C (ASCORBIC ACID) 500 MG tablet Take 500 mg by mouth daily.   Yes [provider]  zinc gluconate 50 MG tablet Take 50 mg by mouth daily.   Yes [provider]  Continuous Blood Gluc Sensor (FREESTYLE LIBRE 2 SENSOR) MISC  09/08/21   [provider]    Physical Exam: Vitals:   09/28/21 1730 09/28/21 1830 09/28/21 1900 09/28/21 2029  BP: 119/74 100/63 121/76 127/72  Pulse: (!) 120 (!) 119 (!) 108 (!) 117  Resp: $Remo'19 17 20 20  'Gmvwv$ Temp:   98.8 F (37.1 C) 98.5 F (36.9 C)  TempSrc:      SpO2: 97% 95% 96% 98%   Physical Exam Vitals and nursing note reviewed.  Constitutional:      Appearance: He is obese.     Comments: Chronically ill-appearing  HENT:     Head: Normocephalic and atraumatic.     Nose: Nose normal.     Mouth/Throat:     Mouth: Mucous membranes are moist.  Eyes:     Pupils: Pupils are equal, round, and reactive to light.  Cardiovascular:     Rate and Rhythm: Tachycardia present.   Pulmonary:     Breath sounds: Rales present.     Comments: At the bases bilaterally Abdominal:     General: Abdomen is flat. Bowel sounds are normal.     Palpations: Abdomen is soft.  Musculoskeletal:     Cervical back: Normal range of motion and neck supple.     Comments: Dressing over right heel with wound VAC  in place  Neurological:     General: No focal deficit present.     Mental Status: He is alert and oriented to person, place, and time.     Motor: Weakness present.  Psychiatric:        Mood and Affect: Mood normal.        Behavior: Behavior normal.    Data Reviewed: Relevant notes from primary care and specialist visits, past discharge summaries as available in EHR, including Care Everywhere. Prior diagnostic testing as pertinent to current admission diagnoses Updated medications and problem lists for reconciliation ED course, including vitals, labs, imaging, treatment and response to treatment Triage notes, nursing and pharmacy notes and ED provider's notes Notable results as noted in HPI Labs reviewed.  Lactic acid 1.2, sodium 138, potassium 4.5, chloride 100, bicarb 32, glucose 421, BUN 24, creatinine 1.01, calcium 8.5, total protein 7.1, albumin 2.9, AST 18, ALT 19, alk phos 187, PT 15.8, INR 1.3, white count 9.3, hemoglobin 11.6, hematocrit 38.6, platelet count 274 Chest x-ray reviewed by me shows Findings suggestive of CHF with pulmonary edema. Left foot x-ray shows Soft tissue ulceration in the plantar heel with gas and swelling extending to the osseous surface and cortical irregularity along the inferior aspect of the calcaneus, concerning for osteomyelitis. Prior transmetatarsal amputation of the third digit and distal fifth metatarsal. Twelve-lead EKG reviewed by me shows sinus tachycardia, incomplete right bundle branch block, right axis deviation There are no new results to review at this time.  Assessment and Plan: * Osteomyelitis ([place]) Patient has an ulcer  over his left heel and is status post recent left heel partial calcanectomy. He was treated with IV antibiotics as an outpatient for cellulitis and switch to Augmentin for 2 weeks which he is currently on. He also has a wound VAC to the left heel He presents to the ER for evaluation of worsening redness involving the left heel and imaging shows soft tissue ulceration in the plantar heel with gas and swelling extending to the osseous surface and cortical irregularity along the inferior aspect of the calcaneus, concerning for osteomyelitis. Prior transmetatarsal amputation of the third digit and distal fifth metatarsal. ER physician discussed x-ray findings with podiatry who has recommended IV antibiotics and an MRI for now. We will place patient on IV Rocephin and Flagyl Follow-up results of MRI of the left foot    Foot ulcer with fat layer exposed, left ([place]) Treatment as outlined in 1  Acute on chronic diastolic CHF (congestive heart failure) ([place]) Patient's last known LVEF is 55 to 60% from a 2D echocardiogram which was done 01/23 He presents for evaluation of worsening shortness of breath from his baseline associated with orthopnea and bilateral lower extremity swelling Chest x-ray shows findings suggestive of pulmonary edema Place patient on Lasix 40 mg IV twice daily Continue metoprolol and losartan  Uncontrolled type 2 diabetes mellitus with hyperglycemia, with long-term current use of insulin ([place]) Patient has a history of diabetes mellitus and noted to have significant hyperglycemia Chart review shows A1c greater than 10 Continue long-acting insulin and place patient on sliding scale coverage Maintain consistent carbohydrate diet  AF (paroxysmal atrial fibrillation) (HCC) Continue metoprolol and amiodarone for rate control Continue apixaban as primary prophylaxis for an acute stroke  Obesity, Class III, BMI 40-49.9 (morbid obesity) ([place]) Patient has a BMI of 58.83  kg/m2 Complicates overall prognosis and care Lifestyle modification and exercise has been discussed with patient in detail  Essential hypertension, benign Continue metoprolol and  losartan We will hold nitrates and diltiazem for now since patient is normotensive  RLS (restless legs syndrome) Continue pramipexole      Advance Care Planning:   Code Status: Full Code   Consults: Podiatry, Cardiology  Family Communication: Greater than 50% of time was spent discussing patient's condition and plan of care with him and his wife at the bedside.  All questions and concerns have been addressed.  They verbalized understanding and agree with the plan.  Severity of Illness: The appropriate patient status for this patient is INPATIENT. Inpatient status is judged to be reasonable and necessary in order to provide the required intensity of service to ensure the patient's safety. The patient's presenting symptoms, physical exam findings, and initial radiographic and laboratory data in the context of their chronic comorbidities is felt to place them at high risk for further clinical deterioration. Furthermore, it is not anticipated that the patient will be medically stable for discharge from the hospital within 2 midnights of admission.   * I certify that at the point of admission it is my clinical judgment that the patient will require inpatient hospital care spanning beyond 2 midnights from the point of admission due to high intensity of service, high risk for further deterioration and high frequency of surveillance required.*  Author: Collier Bullock, MD 09/28/2021 9:05 PM  For on call review [email or web address].

## 2021-09-28 NOTE — Telephone Encounter (Signed)
Received phone call from patient's wife. He received '80mg'$  furosemide IV/ 5mq PO potassium 2 days ago due to SOB, weight gain  and worsening pedal edema.   Wife says that the swelling in both legs is even worse and now his thighs are "so tight you could bounce a quarter off of it". She also says that both lower legs are now red (since yesterday) and hot to touch. He still has an open wound with a wound vac from his recent surgery on the left heel which is also causing him a lot of pain.   She says that last time his legs looked like this, he had to be admitted. Has finished his IV antibiotics and has started the augmentin.   Advised her that she could try calling Dr. RDelaine Lamefor guidance or take him to the ED for further evaluation as he may be developing cellulitis. Patient's wife verbalized understanding and was appreciative of the information.

## 2021-09-28 NOTE — Assessment & Plan Note (Addendum)
Uncontrolled.  Last hemoglobin A1c in the chart of 10.2.  Continue Semglee insulin to 21 units twice a day.  Continue short acting insulin prior to meals plus sliding scale.

## 2021-09-29 ENCOUNTER — Encounter: Payer: Self-pay | Admitting: Internal Medicine

## 2021-09-29 ENCOUNTER — Ambulatory Visit: Payer: Medicare Other | Admitting: Family

## 2021-09-29 DIAGNOSIS — M86672 Other chronic osteomyelitis, left ankle and foot: Secondary | ICD-10-CM | POA: Diagnosis not present

## 2021-09-29 DIAGNOSIS — G9341 Metabolic encephalopathy: Secondary | ICD-10-CM | POA: Clinically undetermined

## 2021-09-29 DIAGNOSIS — I5033 Acute on chronic diastolic (congestive) heart failure: Secondary | ICD-10-CM | POA: Diagnosis not present

## 2021-09-29 DIAGNOSIS — E440 Moderate protein-calorie malnutrition: Secondary | ICD-10-CM | POA: Diagnosis present

## 2021-09-29 DIAGNOSIS — R531 Weakness: Secondary | ICD-10-CM

## 2021-09-29 LAB — BASIC METABOLIC PANEL
Anion gap: 6 (ref 5–15)
BUN: 22 mg/dL (ref 8–23)
CO2: 33 mmol/L — ABNORMAL HIGH (ref 22–32)
Calcium: 8.3 mg/dL — ABNORMAL LOW (ref 8.9–10.3)
Chloride: 103 mmol/L (ref 98–111)
Creatinine, Ser: 0.99 mg/dL (ref 0.61–1.24)
GFR, Estimated: >60 mL/min (ref >60)
Glucose, Bld: 172 mg/dL — ABNORMAL HIGH (ref 70–99)
Potassium: 4.1 mmol/L (ref 3.5–5.1)
Sodium: 142 mmol/L (ref 135–145)

## 2021-09-29 LAB — BRAIN NATRIURETIC PEPTIDE: B Natriuretic Peptide: 278.1 pg/mL — ABNORMAL HIGH (ref 0.0–100.0)

## 2021-09-29 LAB — PREALBUMIN: Prealbumin: 6.5 mg/dL — ABNORMAL LOW (ref 18–38)

## 2021-09-29 LAB — CBC
HCT: 39.2 % (ref 39.0–52.0)
Hemoglobin: 11.5 g/dL — ABNORMAL LOW (ref 13.0–17.0)
MCH: 27 pg (ref 26.0–34.0)
MCHC: 29.3 g/dL — ABNORMAL LOW (ref 30.0–36.0)
MCV: 92 fL (ref 80.0–100.0)
Platelets: 252 10*3/uL (ref 150–400)
RBC: 4.26 MIL/uL (ref 4.22–5.81)
RDW: 16.7 % — ABNORMAL HIGH (ref 11.5–15.5)
WBC: 6.8 10*3/uL (ref 4.0–10.5)
nRBC: 0 % (ref 0.0–0.2)

## 2021-09-29 LAB — GLUCOSE, CAPILLARY
Glucose-Capillary: 155 mg/dL — ABNORMAL HIGH (ref 70–99)
Glucose-Capillary: 217 mg/dL — ABNORMAL HIGH (ref 70–99)
Glucose-Capillary: 244 mg/dL — ABNORMAL HIGH (ref 70–99)
Glucose-Capillary: 210 mg/dL — ABNORMAL HIGH (ref 70–99)

## 2021-09-29 LAB — C-REACTIVE PROTEIN: CRP: 16.7 mg/dL — ABNORMAL HIGH (ref <1.0)

## 2021-09-29 MED ORDER — PROSOURCE PLUS PO LIQD
30.0000 mL | Freq: Three times a day (TID) | ORAL | Status: DC
  Administered 2021-09-29 – 2021-10-02 (×10): 30 mL via ORAL
  Filled 2021-09-29 (×13): qty 30

## 2021-09-29 MED ORDER — GLUCERNA SHAKE PO LIQD
237.0000 mL | Freq: Two times a day (BID) | ORAL | Status: DC
  Administered 2021-09-30 – 2021-10-15 (×22): 237 mL via ORAL

## 2021-09-29 MED ORDER — VANCOMYCIN HCL 1250 MG/250ML IV SOLN
1250.0000 mg | Freq: Once | INTRAVENOUS | Status: AC
Start: 2021-09-30 — End: 2021-09-30
  Administered 2021-09-30: 1250 mg via INTRAVENOUS
  Filled 2021-09-29: qty 250

## 2021-09-29 MED ORDER — ENSURE ENLIVE PO LIQD
237.0000 mL | Freq: Two times a day (BID) | ORAL | Status: DC
Start: 2021-09-29 — End: 2021-09-29
  Administered 2021-09-29: 237 mL via ORAL

## 2021-09-29 MED ORDER — CHLORHEXIDINE GLUCONATE 0.12 % MT SOLN
15.0000 mL | Freq: Two times a day (BID) | OROMUCOSAL | Status: DC
  Administered 2021-09-29 – 2021-11-13 (×85): 15 mL via OROMUCOSAL
  Filled 2021-09-29 (×79): qty 15

## 2021-09-29 MED ORDER — ORAL CARE MOUTH RINSE
15.0000 mL | Freq: Two times a day (BID) | OROMUCOSAL | Status: DC
  Administered 2021-09-29 – 2021-10-12 (×22): 15 mL via OROMUCOSAL

## 2021-09-29 MED ORDER — DILTIAZEM HCL 30 MG PO TABS
90.0000 mg | ORAL_TABLET | Freq: Three times a day (TID) | ORAL | Status: DC
  Administered 2021-09-29 – 2021-10-02 (×11): 90 mg via ORAL
  Filled 2021-09-29 (×11): qty 3

## 2021-09-29 MED ORDER — VANCOMYCIN HCL 1250 MG/250ML IV SOLN
1250.0000 mg | Freq: Once | INTRAVENOUS | Status: AC
  Administered 2021-09-30: 1250 mg via INTRAVENOUS
  Filled 2021-09-29: qty 250

## 2021-09-29 MED ORDER — LIVING WELL WITH DIABETES BOOK
Freq: Once | Status: AC
  Filled 2021-09-29: qty 1

## 2021-09-29 MED ORDER — FUROSEMIDE 10 MG/ML IJ SOLN: 60.0000 mg | Freq: Two times a day (BID) | INTRAMUSCULAR | Status: DC

## 2021-09-29 MED ORDER — INSULIN ASPART 100 UNIT/ML IJ SOLN
3.0000 [IU] | Freq: Three times a day (TID) | INTRAMUSCULAR | Status: DC
Start: 2021-09-29 — End: 2021-10-01
  Administered 2021-09-29 – 2021-10-01 (×4): 3 [IU] via SUBCUTANEOUS
  Filled 2021-09-29 (×5): qty 1

## 2021-09-29 MED ORDER — NYSTATIN 100000 UNIT/GM EX POWD
Freq: Two times a day (BID) | CUTANEOUS | Status: DC
  Administered 2021-09-29 – 2021-10-28 (×3): 1 via TOPICAL
  Filled 2021-09-29 (×7): qty 15

## 2021-09-29 MED ORDER — FUROSEMIDE 10 MG/ML IJ SOLN
80.0000 mg | Freq: Two times a day (BID) | INTRAMUSCULAR | Status: DC
  Administered 2021-09-29: 80 mg via INTRAVENOUS
  Filled 2021-09-29: qty 8

## 2021-09-29 NOTE — Consult Note (Addendum)
[facility] CLINIC CARDIOLOGY CONSULT NOTE       Patient ID: [name]. MRN: [id number] DOB/AGE: June 05, 1955 66 y.o.  Admit date: 09/28/2021 Referring Physician Dr. Joylene Igo Primary Physician Dr. Osborne Oman Primary Cardiologist Dr. Sena Slate Reason for Consultation acute on chronic HFpEF   HPI: [name]. is a [phone] with a PMH significant for CAD s/p CABG 2006 and PCI of RCA 01/2020, HFpEF (LVEF 50-55% 04/2021), A. fib / flutter on apixaban, amiodarone and digoxin, HTN, BPH, morbid obesity, poorly controlled type 2 diabetes,  PAD s/p left third toe amputation, chronic venous stasis on Unna boots, OSA on CPAP who presented to [facility] ED on 09/28/21 with increasing pain and swelling in his left leg, and imaging concerning for worsening osteomyelitis of his left foot. He has has onoging infections needing wound debridement in his left lower leg over the past several months. Cardiology is consulted for assistance with his heart failure.    The patient states over the past few days he just became so weak he could barely walk and get out of bed.  He was recently seen at the heart failure clinic on 5/30 where he weighed 35 pounds more than he did 2.5 months ago at his last visit there.  He says he recently increased his Lasix from 60 mg once daily to 80 mg once daily and it did not seem to work to get all of his swelling off.  At the heart failure clinic he was given a one-time dose of 80 mg IV Lasix, and taken off of daily oral Lasix started on torsemide 60 mg daily, which she took for the past 2 days.  He says he significantly cut back on his fluid intake to a 60 ounce bottle of water per day and one 8 ounce cup of half calf coffee (was previously drinking 2 entire pots of coffee per day), and stopped adding salt to his foods and uses a salt substitute.  Despite this, his weight has increased.  He also has ongoing issues with incredibly poor diabetic control, states he does not check  his sugars or use insulin as prescribed because "he just gets tired of doing so."  Recent A1c was 10%.  He has been on IV antibiotics for some time for early osteomyelitis in his left foot, unfortunately this is progressed on MRI this admission.  At my time of evaluation he says he feels pretty weak, but made lots of urine with IV Lasix overnight.  Feels somewhat short of breath and is on supplemental oxygen by nasal cannula.  Denies chest pain, palpitations, orthopnea.  Labs are notable for potassium 4.1, BUN/creatinine 22/0.9, EGFR greater than 60.  BNP downtrending over the past 3 days from 491-278.  Blood pressure 140/83, heart rate steady at 122 in atrial flutter.  Chest x-ray with pulmonary vascular congestion without large pleural fluid collection.  Left foot MRI with substantially increased osteomyelitis of the calcaneus with at least partial rupture of the plantar fascia, large plantar heel ulcer that extends from the bony margin of the calcaneus with continued substantial distal Achilles tendinopathy, diffuse cellulitis especially along the heel.   Review of systems complete and found to be negative unless listed above     Past Medical History:  Diagnosis Date   Arrhythmia    atrial fibrillation   Asthma    CHF (congestive heart failure) (HCC)    COPD (chronic obstructive pulmonary disease) (HCC)    Coronary artery disease  Depression    Diabetes mellitus without complication ([place])    Gout    History anabolic steroid use    Hyperlipidemia    Hypertension    Hypogonadism in male    Morbid obesity ([place])    Myocardial infarction ([place])    Peripheral vascular disease ([place])    Perirectal abscess    Pleurisy    Sleep apnea    CPAP at night, no oxygen   Varicella     Past Surgical History:  Procedure Laterality Date   ABDOMINAL AORTIC ANEURYSM REPAIR     ACHILLES TENDON SURGERY Left 01/10/2021   Procedure: ACHILLES LENGTHENING/KIDNER;  Surgeon: Caroline More, DPM;   Location: [facility] ORS;  Service: Podiatry;  Laterality: Left;   AMPUTATION TOE Right 02/10/2016   Procedure: AMPUTATION TOE 3RD TOE;  Surgeon: Samara Deist, DPM;  Location: [facility];  Service: Podiatry;  Laterality: Right;   AMPUTATION TOE Left 02/24/2020   Procedure: AMPUTATION TOE;  Surgeon: Caroline More, DPM;  Location: [facility] OR[facility];  Service: Podiatry;  Laterality: Left;   APPLICATION OF WOUND VAC Left 02/29/2020   Procedure: APPLICATION OF WOUND VAC;  Surgeon: Caroline More, DPM;  Location: [facility] ORS;  Service: Podiatry;  Laterality: Left;   COLONOSCOPY WITH PROPOFOL [name] 11/18/2015   Procedure: COLONOSCOPY WITH PROPOFOL;  Surgeon: Manya Silvas, MD;  Location: [facility] ENDOSCOPY;  Service: Endoscopy;  Laterality: [name];   CORONARY ARTERY BYPASS GRAFT     CORONARY STENT INTERVENTION [name] 02/02/2020   Procedure: CORONARY STENT INTERVENTION;  Surgeon: Isaias Cowman, MD;  Location: [place] CV LAB;  Service: Cardiovascular;  Laterality: [name];   INCISION AND DRAINAGE Left 08/07/2021   Procedure: INCISION AND DRAINAGE-Partial Calcanectomy;  Surgeon: Caroline More, DPM;  Location: [facility] OR[facility];  Service: Podiatry;  Laterality: Left;   IRRIGATION AND DEBRIDEMENT FOOT Left 02/29/2020   Procedure: IRRIGATION AND DEBRIDEMENT FOOT;  Surgeon: Caroline More, DPM;  Location: [facility];  Service: Podiatry;  Laterality: Left;   IRRIGATION AND DEBRIDEMENT FOOT Left 02/24/2020   Procedure: IRRIGATION AND DEBRIDEMENT FOOT;  Surgeon: Caroline More, DPM;  Location: [facility];  Service: Podiatry;  Laterality: Left;   KNEE ARTHROSCOPY     LEFT HEART CATH AND CORS/GRAFTS ANGIOGRAPHY [name] 02/02/2020   Procedure: LEFT HEART CATH AND CORS/GRAFTS ANGIOGRAPHY;  Surgeon: Teodoro Spray, MD;  Location: [place] [facility];  Service: Cardiovascular;  Laterality: [name];   LOWER EXTREMITY ANGIOGRAPHY Left 02/25/2020   Procedure: Lower Extremity Angiography;  Surgeon: Algernon Huxley, MD;  Location: [place] [facility];  Service:  Cardiovascular;  Laterality: Left;   LOWER EXTREMITY ANGIOGRAPHY Left 01/04/2021   Procedure: LOWER EXTREMITY ANGIOGRAPHY;  Surgeon: Algernon Huxley, MD;  Location: [place] [facility];  Service: Cardiovascular;  Laterality: Left;   METATARSAL HEAD EXCISION Left 01/10/2021   Procedure: METATARSAL HEAD EXCISION - LEFT 5th;  Surgeon: Caroline More, DPM;  Location: [facility];  Service: Podiatry;  Laterality: Left;   PERIPHERAL VASCULAR CATHETERIZATION Right 01/24/2016   Procedure: Lower Extremity Angiography;  Surgeon: Katha Cabal, MD;  Location: [place] [facility];  Service: Cardiovascular;  Laterality: Right;   PERIPHERAL VASCULAR CATHETERIZATION Right 01/25/2016   Procedure: Lower Extremity Angiography;  Surgeon: Katha Cabal, MD;  Location: [place] [facility];  Service: Cardiovascular;  Laterality: Right;   TOE AMPUTATION     TONSILLECTOMY      Medications Prior to Admission  Medication Sig Dispense Refill Last Dose   amiodarone (PACERONE) 200 MG tablet Take 200 mg by  mouth daily.   09/28/2021   amoxicillin-clavulanate (AUGMENTIN) 875-125 MG tablet Take 1 tablet by mouth 2 (two) times daily. 60 tablet 0 09/28/2021   apixaban (ELIQUIS) 5 MG TABS tablet Take 1 tablet (5 mg total) by mouth 2 (two) times daily. 60 tablet 5 09/28/2021   aspirin EC 81 MG EC tablet Take 1 tablet (81 mg total) by mouth daily. Swallow whole. 30 tablet 0 09/28/2021   Cholecalciferol 25 MCG (1000 UT) tablet Take 1,000 Units by mouth daily.   09/28/2021   Cyanocobalamin (VITAMIN B-12) 5000 MCG TBDP Take 10,000 mcg by mouth daily.   09/28/2021   diltiazem (CARDIZEM CD) 300 MG 24 hr capsule Take 1 capsule (300 mg total) by mouth daily.   09/28/2021   ezetimibe (ZETIA) 10 MG tablet Take 10 mg by mouth at bedtime.   09/27/2021   ferrous sulfate 325 (65 FE) MG tablet Take 325 mg by mouth daily with breakfast.   09/28/2021   gabapentin (NEURONTIN) 300 MG capsule Take 300 mg by mouth at bedtime.   09/27/2021   HUMALOG 100 UNIT/ML  injection Inject 15 Units into the skin in the morning and at bedtime. Sliding scale   09/28/2021   insulin glargine (LANTUS) 100 UNIT/ML Solostar Pen Inject 25 Units into the skin in the morning and at bedtime. 15 mL 11 09/28/2021   isosorbide mononitrate (IMDUR) 60 MG 24 hr tablet Take 60 mg by mouth 2 (two) times daily.   09/28/2021   losartan (COZAAR) 50 MG tablet Take 50 mg by mouth daily.   09/28/2021   metFORMIN (GLUCOPHAGE) 1000 MG tablet Take 1,000 mg by mouth 2 (two) times daily.    09/28/2021   metoprolol tartrate (LOPRESSOR) 50 MG tablet Take 100 mg by mouth 2 (two) times daily.   09/28/2021   Multiple Vitamins-Minerals (MULTIVITAMIN WITH MINERALS) tablet Take 1 tablet by mouth daily.   09/28/2021   nitroGLYCERIN (NITROSTAT) 0.4 MG SL tablet Place 0.4 mg under the tongue every 5 (five) minutes as needed for chest pain.   Past Week   pramipexole (MIRAPEX) 1 MG tablet Take 2 mg by mouth at bedtime.   09/27/2021   primidone (MYSOLINE) 250 MG tablet Take 250 mg by mouth 2 (two) times daily.   09/28/2021   tamsulosin (FLOMAX) 0.4 MG CAPS capsule Take 0.4 mg by mouth daily.   09/28/2021   torsemide (DEMADEX) 20 MG tablet Take 3 tablets (60 mg total) by mouth daily. 100 tablet 3 09/28/2021   traMADol (ULTRAM) 50 MG tablet Take 1 tablet (50 mg total) by mouth daily as needed for moderate pain. 10 tablet 0 09/27/2021   vitamin C (ASCORBIC ACID) 500 MG tablet Take 500 mg by mouth daily.   09/28/2021   zinc gluconate 50 MG tablet Take 50 mg by mouth daily.   09/28/2021   Continuous Blood Gluc Sensor (FREESTYLE LIBRE 2 SENSOR) MISC       Social History   Socioeconomic History   Marital status: Married    Spouse name: Not on file   Number of children: Not on file   Years of education: Not on file   Highest education level: Not on file  Occupational History   Not on file  Tobacco Use   Smoking status: Former    Packs/day: 0.50    Years: 45.00    Pack years: 22.50    Types: Cigarettes    Quit date: 04/05/2015     Years since quitting: 6.4   Smokeless tobacco:  Never  Vaping Use   Vaping Use: Every day  Substance and Sexual Activity   Alcohol use: Not Currently    Alcohol/week: 3.0 standard drinks    Types: 3 Glasses of wine per week   Drug use: No   Sexual activity: Not on file  Other Topics Concern   Not on file  Social History Narrative   Not on file   Social Determinants of Health   Financial Resource Strain: Not on file  Food Insecurity: Not on file  Transportation Needs: Not on file  Physical Activity: Not on file  Stress: Not on file  Social Connections: Not on file  Intimate Partner Violence: Not on file    Family History  Problem Relation Age of Onset   Hypertension Father    Coronary artery disease Father    Alcohol abuse Father    Heart failure Brother       PHYSICAL EXAM General: Ill-appearing Caucasian male,  in no acute distress.  Sitting upright in PCU bed eating breakfast HEENT:  Normocephalic and atraumatic. Neck:  No JVD.  Lungs: Normal respiratory effort on oxygen by nasal cannula. Clear bilaterally to auscultation anteriorly. No wheezes, crackles, rhonchi.  Heart: Tachy but regular. Normal S1 and S2 without gallops or murmurs. Abdomen: Non-distended appearing.  Msk: Normal strength and tone for age. Extremities:  Significant bilateral lower extremity edema left greater than right with woody appearance and scaling on right lower leg.  Left lower leg with circumferential erythema and warmth to palpation.  Both heels wrapped in wound dressings.  Left lower extremity with wound VAC.  Both feet s/p multiple toes amputated Neuro: Alert and oriented X 3. Psych: Despondent mood, answers questions appropriately.   Labs:   Lab Results  Component Value Date   WBC 6.8 09/29/2021   HGB 11.5 (L) 09/29/2021   HCT 39.2 09/29/2021   MCV 92.0 09/29/2021   PLT 252 09/29/2021    Recent Labs  Lab 09/28/21 1404 09/29/21 0610  NA 138 142  K 4.5 4.1  CL 100 103  CO2 32  33*  BUN 24* 22  CREATININE 1.01 0.99  CALCIUM 8.5* 8.3*  PROT 7.1  --   BILITOT 0.9  --   ALKPHOS 187*  --   ALT 19  --   AST 18  --   GLUCOSE 421* 172*   Lab Results  Component Value Date   CKTOTAL 27 (L) 08/08/2021   No results found for: CHOL No results found for: HDL No results found for: LDLCALC No results found for: TRIG No results found for: CHOLHDL No results found for: LDLDIRECT    Radiology: MR FOOT LEFT WO CONTRAST  Result Date: 09/29/2021 CLINICAL DATA:  Osteomyelitis of the foot. EXAM: MRI OF THE LEFT FOOT WITHOUT CONTRAST TECHNIQUE: Multiplanar, multisequence MR imaging of the left forefoot was performed. No intravenous contrast was administered. COMPARISON:  Radiograph 09/28/2021 FINDINGS: Despite efforts by the technologist and patient, motion artifact is present on today's exam and could not be eliminated. This reduces exam sensitivity and specificity. Bones/Joint/Cartilage Substantially worsened osteomyelitis of the calcaneus with abnormal edema throughout the posterior calcaneus and extending to the calcaneal neck. Exposed bony surface of the posteroinferior calcaneus due to the large ulceration shown on image 21 series 13. Degenerative lesions along the calcaneal side of the posterior subtalar joint. Mild degenerative loss of articular cartilage thickness at the tibiotalar joint. Mild degenerative spurring in the midfoot. Ligaments Grossly intact, motion artifact precludes detailed assessment. Muscles and  Tendons Substantial distal Achilles tendinopathy with fusiform expansion of the Achilles tendon minimally worsened from prior. Defect and possible rupture of the proximal attachment of the plantar fascia especially the medial band. Common peroneus tendon sheath tenosynovitis. Mild distal tibialis posterior tenosynovitis. Soft tissues Dominant finding is the deep ulceration along the plantar heel extending to the bony margin of the calcaneus on image 22 series 13. This  large soft tissue defect is accompanied by substantial subcutaneous edema along the heel and tracking to a lesser extent along the ankle diffusely. IMPRESSION: 1. Substantially increased osteomyelitis of the calcaneus. At least partial rupture of the plantar fascia. A large plantar heel ulcer extends to the bony margin of the calcaneus. 2. Continued substantial distal Achilles tendinopathy. 3. Mild degenerative findings at the posterior subtalar joint. Mild degenerative spurring in the midfoot. 4. Diffuse cellulitis especially along the heel. Electronically Signed   By: Van Clines M.D.   On: 09/29/2021 07:38   DG Chest Port 1 View  Result Date: 09/28/2021 CLINICAL DATA:  Lower extremity swelling EXAM: PORTABLE CHEST 1 VIEW COMPARISON:  08/08/2021 FINDINGS: Stable cardiomegaly status post sternotomy and CABG. Pulmonary vascular congestion with bilateral perihilar and bibasilar interstitial opacities. No large pleural fluid collection. Please note the right costophrenic angle was excluded from the field of view. No pneumothorax. IMPRESSION: Findings suggestive of CHF with pulmonary edema. Electronically Signed   By: Davina Poke D.O.   On: 09/28/2021 14:19   DG Foot Complete Left  Result Date: 09/28/2021 CLINICAL DATA:  History of foot wound and osteo. EXAM: LEFT FOOT - COMPLETE 3+ VIEW COMPARISON:  August 07, 2021. FINDINGS: Soft tissue ulceration in the plantar heel with gas and swelling extending to the osseous surface and cortical irregularity along the inferior aspect of the calcaneus. Prior transmetatarsal amputation of the third digit and distal fifth metatarsal. Soft tissue swelling about the foot. IMPRESSION: 1. Soft tissue ulceration in the plantar heel with gas and swelling extending to the osseous surface and cortical irregularity along the inferior aspect of the calcaneus, concerning for osteomyelitis. 2. Prior transmetatarsal amputation of the third digit and distal fifth metatarsal.  Electronically Signed   By: Dahlia Bailiff M.D.   On: 09/28/2021 18:20    ECHO 04/2021  1. Left ventricular ejection fraction, by estimation, is 50 to 55%. The  left ventricle has low normal function. The left ventricle has no regional  wall motion abnormalities. There is moderate left ventricular hypertrophy.  Left ventricular diastolic  function could not be evaluated.   2. Right ventricular systolic function was not well visualized. The right  ventricular size is not well visualized.   3. The mitral valve is normal in structure. No evidence of mitral valve  regurgitation.   4. The aortic valve is normal in structure. Aortic valve regurgitation is  not visualized. Aortic valve sclerosis is present, with no evidence of  aortic valve stenosis.   TELEMETRY reviewed by me: Atrial flutter with rates mostly in the 110s  EKG reviewed by me: Atrial flutter, incomplete RBBB rate 120  ASSESSMENT AND PLAN:  [name]. is a [phone] with a PMH significant for CAD s/p CABG 2006 and PCI of RCA 01/2020, HFpEF (LVEF 50-55% 04/2021) A. fib / flutter on apixaban, amiodarone and digoxin, HTN, BPH, morbid obesity, poorly controlled type 2 diabetes,  PAD s/p left third toe amputation, chronic venous stasis on Unna boots, OSA on CPAP who presented to [facility] ED on 09/28/21 with increasing pain and swelling  in his left leg, and imaging concerning for worsening osteomyelitis of his left foot. He has has onoging infections needing wound debridement in his left lower leg over the past several months. Cardiology is consulted for assistance with his heart failure.    #Acute on chronic HFpEF (LVEF 50-55% 04/2021) Patient presents with a increasing weakness and inability to lift his legs and 35 pound weight gain over the past 2-1/2 months, despite attempts at fluid restriction salt substitution.  He presented to heart failure clinic on 5/30 and his BNP was elevated at 500, was given 80 mg of IV Lasix x1, with  improvement in his BNP down to 280 today.  He has had brisk diuresis with IV Lasix thus far.  Although he remains incredibly volume overloaded in his lower extremities (with significant erythema and warmth to his left lower leg) and pulmonary edema on chest x-ray. -S/p IV Lasix 60 mg x 1, 40 mg x 1 with almost 1 L reported output so far. -We will give 80 mg IV Lasix x3 doses starting this afternoon, then decrease to 60 mg twice daily thereafter, with close monitoring of his renal function (creatinine 0.99, GFR greater than 60 today) -Continue GDMT with metoprolol 100 mg XL once daily -Hold home losartan 50 mg with diuresis, restart at discharge -Repeat echocardiogram complete -Strict I's/O, daily weights  #Left foot osteomyelitis #Poorly controlled type 2 diabetes -Ongoing issues over the past several months with left foot early osteo and cellulitis, has been on IV antibiotics for some time.  MRI showing worsening of his left foot osteo, likely needing surgical involvement.  #Atrial flutter, paroxysmal atrial fibrillation Atrial flutter with heart rates in the and 110s mostly, -Continue amiodarone 200 mg once daily, metoprolol 100 mg XL once daily -Continue Eliquis 5 mg twice daily for stroke risk reduction, CHA2DS2-VASc 5  #CAD s/p CABG 2006, PCI of RCA 01/2020 -Continue aspirin, Zetia  This patient's plan of care was discussed and created with Dr. Saralyn Pilar and he is in agreement.  Signed: Tristan Schroeder , PA-C 09/29/2021, 9:36 AM [facility] Cardiology

## 2021-09-29 NOTE — Hospital Course (Addendum)
[name]. is a 66 y.o. male with medical history significant for chronic diastolic dysfunction CHF with LVEF of 50 to 55%, hypertension, coronary artery disease, depression, COPD, A-fib, morbid obesity (BMI 49), obstructive sleep apnea, status post recent wound debridement and partial calcanectomy (05/16) currently on oral antibiotic therapy after completion of IV who presents to the ER for evaluation of bilateral lower extremity swelling as well as increased redness and swelling involving the left lower extremity mostly over the heel. MRI of the foot showed left foot osteomyelitis, placed on vancomycin and Zosyn. Blood culture positive for MRSA secondary to osteomyelitis.   Left BKA performed on 6/6.  Due to volume overload due to acute on chronic diastolic congestive heart failure with secondary hypoxic respiratory failure.  Started IV Lasix 6/6 through 6/17.  Had been transitioned to oral torsemide, however due to persistent hypoxia and worsening chest x-ray with new pleural effusion and pulmonary edema, resumed on IV Lasix at 60 mg twice daily (p.m. on 6/18).  Currently patient on daptomycin as per ID to treat MRSA sepsis and will need to be on for a total of 4 weeks.  Repeat blood cultures negative.  Acute delirium during the entire hospitalization.  Mental status contniues to wax and wane.  Answering questions appropriately.  Continue Seroquel at night and BiPAP at night.  Foley catheter placed on 6/11 secondary to urinary retention.  Flomax increased to twice a day dosing and added Proscar.  Foley removed for voiding trial on 6/16, retention appears resolved.  Voiding well without significant post-void residuals per nursing.

## 2021-09-29 NOTE — Progress Notes (Signed)
Progress Note   Patient: [name]. AJO:[id number] DOB: 05-24-1955 DOA: 09/28/2021     1 DOS: the patient was seen and examined on 09/29/2021   Brief hospital course: [name]. is a 66 y.o. male with medical history significant for chronic diastolic dysfunction CHF with last known LVEF of 50 to 55%, hypertension, coronary artery disease, depression, COPD, A-fib, morbid obesity (BMI 49), obstructive sleep apnea, status post recent wound debridement and partial calcanectomy (05/16) currently on oral antibiotic therapy after completion of IV who presents to the ER for evaluation of bilateral lower extremity swelling as well as increased redness and swelling involving the left lower extremity mostly over the heel. MRI of the foot showed left foot osteomyelitis, scheduled for foot amputation.  Also placed on vancomycin and Zosyn. Patient also has evidence of acute on chronic diastolic congestive heart failure.  Started IV Lasix.  Assessment and Plan: Left foot ulcer. Left foot osteomyelitis. Sepsis ruled out. I reviewed the record, patient did not meet sepsis criteria at time of admission.  MRI did not show evidence of osteomyelitis.  Patient will be seen by podiatry, deemed need amputation of the left foot. Continue antibiotics with Zosyn and vancomycin.  Acute metabolic encephalopathy. Patient become very sleepy today, this could be secondary to infection. Obtain ABG to rule out [place] CO2 retention.  Acute on chronic diastolic congestive heart failure. Acute hypoxemic respiratory failure. POA, ruled in.  Had a worsening shortness of breath, initial oxygen saturation was 85%.  Patient was placed on 4 L oxygen.  This is secondary to exacerbation of congestive heart failure. Continue IV Lasix.  Continue oxygen treatment.  ABG to rule out CO2 retention.  Uncontrolled type 2 diabetes with hyperglycemia. Continue sliding scale insulin, add long-acting insulin.  Moderate protein  calorie malnutrition. Morbid obesity. OSA Patient has BMI of 48.69, patient also has significant malnutrition initially time with pro albumin level of 6.5.  Added Glucerna.  Paroxysmal atrial fibrillation with rapid ventricle response Patient currently on metoprolol and amiodarone.  Also continued on Eliquis. Patient currently in atrial fibrillation with rapid ventricular response.  Blood pressure stable, will add on calcium channel blocker.      Subjective:  Become very sleepy today, short of breath with exertion. Left leg pain.  Physical Exam: Vitals:   09/29/21 0112 09/29/21 0355 09/29/21 0530 09/29/21 0729  BP:  113/73  140/83  Pulse: (!) 110 (!) 110  (!) 108  Resp:  18    Temp:  98.2 F (36.8 C)  98.3 F (36.8 C)  TempSrc:  Oral    SpO2: 96% 93%  94%  Weight:   (!) 181.4 kg   Height:       General exam: Ill-appearing, Respiratory system: Significantly decreased breathing sounds. Respiratory effort normal. Cardiovascular system: Relatively regular, tachycardic. No JVD, murmurs, rubs, gallops or clicks.  Gastrointestinal system: Abdomen is nondistended, soft and nontender. No organomegaly or masses felt. Normal bowel sounds heard. Central nervous system: Drowsy and oriented. No focal neurological deficits. Extremities: Left leg 3+ edema, right leg 2+ edema. Skin: No rashes, lesions or ulcers Psychiatry: Judgement and insight appear normal. Mood & affect appropriate.   Data Reviewed:  Reviewed EKG, all telemetry strips at this admission, chest x-ray, MRI of the foot. All lab results.  Family Communication: wife updated  Disposition: Status is: Inpatient Remains inpatient appropriate because: severity of disease, iv treatment,   Planned Discharge Destination: Skilled nursing facility    Time spent: 55 minutes  Author: Sharen Hones, MD 09/29/2021 1:55 PM  For on call review [email or web address].

## 2021-09-29 NOTE — Progress Notes (Addendum)
Initial Nutrition Assessment  DOCUMENTATION CODES:   Morbid obesity, Non-severe (moderate) malnutrition in context of acute illness/injury  INTERVENTION:  - Liberalize diet to a carb modified to provide widest variety of menu options to promote nutritional adequacy as pt has had poor PO intake   - Ensure Enlive po BID, each supplement provides 350 kcal and 20 grams of protein. - 30 ml ProSource Plus TID, each supplement provides 100 kcals and 15 grams protein.  - MVI with minerals daily - Continue Vitamin C 500 mg daily - Continue Zinc 220 mg daily  NUTRITION DIAGNOSIS:   Moderate Malnutrition related to acute illness (L heel osteomyelitis) as evidenced by energy intake < 75% for > 7 days, mild fat depletion.  GOAL:   Patient will meet greater than or equal to 90% of their needs  MONITOR:   PO intake, Supplement acceptance, Labs, Weight trends, I & O's  REASON FOR ASSESSMENT:   Consult Wound healing  ASSESSMENT:   Pt admitted with wound infection. Found to have osteomyelitis of L heel. PMH significant for uncontrolled W4XL, chronic diastolic dysfunction CHF, HTN, CAD, COPD, afib, morbid obesity, OSA, s/p recent wound debridement and partial calcanectomy (05/16).  Per Surgery, plans for vascular surgery consult as pt may require amputation.   Pt appeared drowsy during visit as he would doze off during assessment. He states that within the last 1-2 weeks he has been eating poorly which he attributes to ongoing lower extremity wounds. His dietary recall includes 1-2c of coffee for breakfast and occasionally coffee for supper with a few bites of food if he feels up to eating. Prior to this, he reports eating 2-3 meals daily but did not provide details. He occasionally may have 1 nutrition supplement daily.   Meal completions: 06-02: 50%-breakfast  He suspects a degree of wt loss but is unsure of his usual wt and how much wt loss he has had. He reports having good mobility  despite lower extremity wounds. Per review of wt hx, pt's wt is noted to be +22.6 kg since 04/10. Pt with h/o CHF.  Edema: very deep pitting BLE  Pt currently meets criteria for acute malnutrition given pt's report of decreased meal intake within the last 1-2 weeks and mild fat depletions, however suspect underlying chronic malnutrition given uncontrolled diabetes. Will continue to reassess as appropriate throughout admission.   Medications: vitamin C '500mg'$  daily, peridex, vitamin D3 1000 units daily, ferrous sulfate, lasix, SSI 0-20 units TID, semglee 25 units BID, MVI, Vitamin B12, zinc sulfate 220 mg daily, IV abx   Labs: HgbA1c 10.2% (4/8), CBG's 217, 155, 389 x24 hours  UOP: 1672m x12 hours I/O's: -625 ml since admission  NUTRITION - FOCUSED PHYSICAL EXAM:  Flowsheet Row Most Recent Value  Orbital Region Mild depletion  Upper Arm Region No depletion  Thoracic and Lumbar Region No depletion  Buccal Region Mild depletion  Temple Region No depletion  Clavicle Bone Region Mild depletion  Clavicle and Acromion Bone Region No depletion  Scapular Bone Region No depletion  Dorsal Hand No depletion  Patellar Region No depletion  Anterior Thigh Region No depletion  Posterior Calf Region No depletion  Edema (RD Assessment) Severe  [very deep pitting BLE]  Hair Reviewed  Eyes Reviewed  Mouth Reviewed  Skin Reviewed  Nails Reviewed      Diet Order:   Diet Order             Diet Carb Modified Fluid consistency: Thin; Room service appropriate? Yes  Diet effective now                  EDUCATION NEEDS:   Not appropriate for education at this time  Skin:  Skin Assessment: Skin Integrity Issues: Skin Integrity Issues:: Wound VAC Wound Vac: L heel--per MD note  Last BM:  PTA  Height:   Ht Readings from Last 1 Encounters:  09/28/21 '6\' 4"'$  (1.93 m)    Weight:   Wt Readings from Last 1 Encounters:  09/29/21 (!) 181.4 kg    Ideal Body Weight:  91.8 kg  BMI:  Body  mass index is 48.69 kg/m.  Estimated Nutritional Needs:   Kcal:  2500-2700  Protein:  125-140g  Fluid:  >/=2.5L  Clayborne Dana, RDN, LDN Clinical Nutrition

## 2021-09-29 NOTE — Plan of Care (Signed)

## 2021-09-29 NOTE — Progress Notes (Signed)
PHARMACY - PHYSICIAN COMMUNICATION CRITICAL VALUE ALERT - BLOOD CULTURE IDENTIFICATION (BCID)  BCID results:  1 (aerobic) of 4 bottles growing MRSA.  Pt currently on Ceftriaxone & Flagyl.   Name of provider contacted: Rachael Fee, NP  Changes to prescribed antibiotics required: Transition Ceftriaxone to Vancomycin, continue Flagyl at this time.  Renda Rolls, PharmD, MBA 09/29/2021 11:10 PM

## 2021-09-29 NOTE — Evaluation (Signed)
Physical Therapy Evaluation Patient Details Name: [name]. MRN: [id number] DOB: Mar 12, 1956 Today's Date: 09/29/2021  History of Present Illness  [name] is a 28yoM who comes to [facility] on 09/28/21 c progression of Left chornic heel ulcer. MRI showed significant progressive osteomyelitis in the left calcaneus. PMH HTN, IDDM2, morbid obesity, depression, CAD s/p CABG 2006 and PCI to RCA in 12/2020, chronic B venous stasis on Unna boots, chronic L foot ulcer, COPD, OSA on CPAP nightly, ai-fib, PAD, s/p multiple toe amputations.  Clinical Impression  Pt admitted with above diagnosis. Pt currently with functional limitations due to the deficits listed below (see "PT Problem List"). Upon entry, pt in bed, asleep, but easily made awake and agreeable to participate. The pt is drowsy and altered throughout session but pleasant and interactive. Pt able to provide some vague info regarding prior level of function, both in tolerance and independence, but when more specific questions are asked regarding recent measures to offload his heel ulcer and DME used, his responses become increasingly more incoherent. Pt unable to SLR bilaterally and has 4/5 bilat elbow extension- unlikely he would be able to rise to standing at present, and less likely in his soon to be postoperative NWB phase. Pt is tachycardic at rest and is requiring O2 at present. Patient's performance this date reveals decreased ability, independence, and tolerance in performing all basic mobility required for performance of activities of daily living. Pt requires additional DME, close physical assistance, and cues for safe participate in mobility. Pt will benefit from skilled PT intervention to increase independence and safety with basic mobility in preparation for discharge to the venue listed below.        Recommendations for follow up therapy are one component of a multi-disciplinary discharge planning process, led by the attending  physician.  Recommendations may be updated based on patient status, additional functional criteria and insurance authorization.  Follow Up Recommendations Skilled nursing-short term rehab (<3 hours/day)    Assistance Recommended at Discharge Frequent or constant Supervision/Assistance  Patient can return home with the following  Two people to help with bathing/dressing/bathroom;Two people to help with walking and/or transfers;Assistance with cooking/housework;Assist for transportation;Help with stairs or ramp for entrance;Direct supervision/assist for medications management;Direct supervision/assist for financial management    Equipment Recommendations    Recommendations for Other Services       Functional Status Assessment Patient has had a recent decline in their functional status and demonstrates the ability to make significant improvements in function in a reasonable and predictable amount of time.     Precautions / Restrictions Precautions Precautions: Fall Restrictions Weight Bearing Restrictions: No      Mobility  Bed Mobility Overal bed mobility:  (bed exam this date; pt with AMS, tachycardia and somnolence.)                  Transfers                        Ambulation/Gait                  Stairs            Wheelchair Mobility    Modified Rankin (Stroke Patients Only)       Balance  Pertinent Vitals/Pain Pain Assessment Pain Assessment: No/denies pain    Home Living Family/patient expects to be discharged to:: Private residence Living Arrangements: Spouse/significant other Available Help at Discharge: Family;Available PRN/intermittently Type of Home: House Home Access: Stairs to enter Entrance Stairs-Rails: Can reach both;Left;Right Entrance Stairs-Number of Steps: 4   Home Layout: One level;Other (Comment) Home Equipment: Rolling Walker (2 wheels);Cane -  quad;Shower seat - built in Additional Comments: Bariatric RW; knee scooter    Prior Function Prior Level of Function : Independent/Modified Independent             Mobility Comments: Pt reports he has been ambulating with RW or using knee scooter (mostly in community) ADLs Comments: wife assists for LB dressing. He has been washing at sink to keep LE dry prior to admission.     Hand Dominance   Dominant Hand: Right    Extremity/Trunk Assessment   Upper Extremity Assessment Upper Extremity Assessment: Generalized weakness (grossly 5/5 elbow flexion and grip; grossly 4/5 elbow extension)    Lower Extremity Assessment Lower Extremity Assessment: Generalized weakness (Able to SLR bilat, unable to SLR bilat)       Communication      Cognition Arousal/Alertness:  (sleepy)   Overall Cognitive Status:  (confused, tangential respones at times, tries to interact. Poor awareness of AMS)                                          General Comments      Exercises     Assessment/Plan    PT Assessment Patient needs continued PT services  PT Problem List Decreased strength;Decreased activity tolerance;Decreased balance;Decreased mobility;Decreased cognition;Decreased knowledge of use of DME;Decreased safety awareness;Cardiopulmonary status limiting activity       PT Treatment Interventions DME instruction;Balance training;Gait training;Neuromuscular re-education;Stair training;Cognitive remediation;Functional mobility training;Patient/family education;Therapeutic activities;Wheelchair mobility training;Therapeutic exercise;Manual techniques;Modalities;Other (comment)    PT Goals (Current goals can be found in the Care Plan section)  Acute Rehab PT Goals PT Goal Formulation: Patient unable to participate in goal setting Time For Goal Achievement: 10/13/21    Frequency Min 2X/week     Co-evaluation               AM-PAC PT "6 Clicks" Mobility   Outcome Measure Help needed turning from your back to your side while in a flat bed without using bedrails?: A Lot Help needed moving from lying on your back to sitting on the side of a flat bed without using bedrails?: A Lot Help needed moving to and from a bed to a chair (including a wheelchair)?: Total Help needed standing up from a chair using your arms (e.g., wheelchair or bedside chair)?: Total Help needed to walk in hospital room?: Total Help needed climbing 3-5 steps with a railing? : Total 6 Click Score: 8    End of Session Equipment Utilized During Treatment: Oxygen Activity Tolerance: Patient limited by lethargy;Treatment limited secondary to medical complications (Comment);Other (comment) (acutely septic and with AMS) Patient left: in bed;with bed alarm set Nurse Communication: Mobility status PT Visit Diagnosis: Difficulty in walking, not elsewhere classified (R26.2);Other abnormalities of gait and mobility (R26.89)    Time: 4081-4481 PT Time Calculation (min) (ACUTE ONLY): 13 min   Charges:   PT Evaluation $PT Eval High Complexity: 1 High        2:25 PM, 09/29/21 Etta Grandchild, PT, DPT Physical Therapist - [facility]  [phone] ([place])    [place] C 09/29/2021, 2:21 PM

## 2021-09-29 NOTE — Progress Notes (Signed)
BIPAP setup, PT not ready for NIV HS. Will call.

## 2021-09-29 NOTE — Consult Note (Addendum)
[facility] Nurse has reviewed record and this patient has a positive xray or MRI for osteomyelitis, this is considered outside of the scope of practice for the [facility] nurse, for that reason [facility] Nurse will not consult.  Additionally this patient has been followed by vascular and by podietry, will await their consultations inpatient for further guidance. Would recommend patient to be converted to hospital [facility] machine for insurance/billing issues. Will add orders for this to the patient's chart.  Patient is followed for RLE in the [facility] wound care center, was seen last week. Updated orders for RLE wound per POC  from [facility].  However we do not carry the silver collagen product inpatient I will sub silver hydrofiber unless the wife chooses to bring in the product from home for use.    Re-consult if only topical wound care needed after orthopedic or surgical evaluation [facility] MSN, RN, [place], CNS, [place] [phone]    3244: Podiatry has seen patient and we have discussed L heel wound. Orders update. Saline moist to moist dressing BID. DC VAC. Patient will need amputation. Vascular to be consulted.   Requested bedside nurse to make sure patient's family takes outpatient NPWT device home and any other supplies, charger to home.   [place], [place], [place]

## 2021-09-29 NOTE — Consult Note (Addendum)
Reason for Consult: Osteomyelitis left heel with chronic wound. Referring Physician: Frederic Jericho. is an 66 y.o. male.  HPI: This is a 66 year old male with diabetes with a chronic wound on his left heel.  Has had previous partial calcanectomy and has been followed by podiatry and the wound care clinic with current use of wound VAC.  Recently some increased swelling in the lower extremities.  Presented to the hospital for evaluation.  MRI showed significant progressive osteomyelitis in the left calcaneus.  Past Medical History:  Diagnosis Date   Arrhythmia    atrial fibrillation   Asthma    CHF (congestive heart failure) (HCC)    COPD (chronic obstructive pulmonary disease) (HCC)    Coronary artery disease    Depression    Diabetes mellitus without complication (HCC)    Gout    History anabolic steroid use    Hyperlipidemia    Hypertension    Hypogonadism in male    Morbid obesity ([place])    Myocardial infarction ([place])    Peripheral vascular disease ([place])    Perirectal abscess    Pleurisy    Sleep apnea    CPAP at night, no oxygen   Varicella     Past Surgical History:  Procedure Laterality Date   ABDOMINAL AORTIC ANEURYSM REPAIR     ACHILLES TENDON SURGERY Left 01/10/2021   Procedure: ACHILLES LENGTHENING/KIDNER;  Surgeon: Caroline More, DPM;  Location: [facility];  Service: Podiatry;  Laterality: Left;   AMPUTATION TOE Right 02/10/2016   Procedure: AMPUTATION TOE 3RD TOE;  Surgeon: Samara Deist, DPM;  Location: [facility];  Service: Podiatry;  Laterality: Right;   AMPUTATION TOE Left 02/24/2020   Procedure: AMPUTATION TOE;  Surgeon: Caroline More, DPM;  Location: [facility];  Service: Podiatry;  Laterality: Left;   APPLICATION OF WOUND VAC Left 02/29/2020   Procedure: APPLICATION OF WOUND VAC;  Surgeon: Caroline More, DPM;  Location: [facility];  Service: Podiatry;  Laterality: Left;   COLONOSCOPY WITH PROPOFOL [name] 11/18/2015   Procedure: COLONOSCOPY WITH PROPOFOL;   Surgeon: Manya Silvas, MD;  Location: [facility];  Service: Endoscopy;  Laterality: [name];   CORONARY ARTERY BYPASS GRAFT     CORONARY STENT INTERVENTION [name] 02/02/2020   Procedure: CORONARY STENT INTERVENTION;  Surgeon: Isaias Cowman, MD;  Location: [place] [facility];  Service: Cardiovascular;  Laterality: [name];   INCISION AND DRAINAGE Left 08/07/2021   Procedure: INCISION AND DRAINAGE-Partial Calcanectomy;  Surgeon: Caroline More, DPM;  Location: [facility];  Service: Podiatry;  Laterality: Left;   IRRIGATION AND DEBRIDEMENT FOOT Left 02/29/2020   Procedure: IRRIGATION AND DEBRIDEMENT FOOT;  Surgeon: Caroline More, DPM;  Location: [facility];  Service: Podiatry;  Laterality: Left;   IRRIGATION AND DEBRIDEMENT FOOT Left 02/24/2020   Procedure: IRRIGATION AND DEBRIDEMENT FOOT;  Surgeon: Caroline More, DPM;  Location: [facility];  Service: Podiatry;  Laterality: Left;   KNEE ARTHROSCOPY     LEFT HEART CATH AND CORS/GRAFTS ANGIOGRAPHY [name] 02/02/2020   Procedure: LEFT HEART CATH AND CORS/GRAFTS ANGIOGRAPHY;  Surgeon: Teodoro Spray, MD;  Location: [place] [facility];  Service: Cardiovascular;  Laterality: [name];   LOWER EXTREMITY ANGIOGRAPHY Left 02/25/2020   Procedure: Lower Extremity Angiography;  Surgeon: Algernon Huxley, MD;  Location: [place] [facility];  Service: Cardiovascular;  Laterality: Left;   LOWER EXTREMITY ANGIOGRAPHY Left 01/04/2021   Procedure: LOWER EXTREMITY ANGIOGRAPHY;  Surgeon: Algernon Huxley, MD;  Location: [place] [facility];  Service: Cardiovascular;  Laterality: Left;   METATARSAL HEAD EXCISION Left 01/10/2021   Procedure: METATARSAL HEAD EXCISION - LEFT 5th;  Surgeon: Caroline More, DPM;  Location: [facility];  Service: Podiatry;  Laterality: Left;   PERIPHERAL VASCULAR CATHETERIZATION Right 01/24/2016   Procedure: Lower Extremity Angiography;  Surgeon: Katha Cabal, MD;  Location: [place] [facility];  Service: Cardiovascular;  Laterality: Right;   PERIPHERAL  VASCULAR CATHETERIZATION Right 01/25/2016   Procedure: Lower Extremity Angiography;  Surgeon: Katha Cabal, MD;  Location: [place] [facility];  Service: Cardiovascular;  Laterality: Right;   TOE AMPUTATION     TONSILLECTOMY      Family History  Problem Relation Age of Onset   Hypertension Father    Coronary artery disease Father    Alcohol abuse Father    Heart failure Brother     Social History:  reports that he quit smoking about 6 years ago. His smoking use included cigarettes. He has a 22.50 pack-year smoking history. He has never used smokeless tobacco. He reports that he does not currently use alcohol after a past usage of about 3.0 standard drinks per week. He reports that he does not use drugs.  Allergies:  Allergies  Allergen Reactions   Penicillins Other (See Comments)    Happened at 66 years old and pt. stated he passed out  He has tolerated amoxicillin/clavulanate and ampicillin/sulbactam   Statins     Other reaction(s): Muscle Pain Causes legs to ache per pt    Medications: Scheduled:  amiodarone  200 mg Oral Daily   apixaban  5 mg Oral BID   vitamin C  500 mg Oral Daily   aspirin EC  81 mg Oral Daily   chlorhexidine  15 mL Mouth Rinse BID   cholecalciferol  1,000 Units Oral Daily   ezetimibe  10 mg Oral Daily   ferrous sulfate  325 mg Oral Q breakfast   [START ON 10/01/2021] furosemide  60 mg Intravenous BID   furosemide  80 mg Intravenous BID   gabapentin  300 mg Oral QHS   insulin aspart  0-20 Units Subcutaneous TID WC   insulin glargine-yfgn  25 Units Subcutaneous BID   mouth rinse  15 mL Mouth Rinse q12n4p   metoprolol tartrate  100 mg Oral BID   multivitamin with minerals  1 tablet Oral Daily   nystatin   Topical BID   pramipexole  2 mg Oral QHS   primidone  250 mg Oral BID   tamsulosin  0.4 mg Oral Daily   vitamin B-12  1,000 mcg Oral Daily   zinc sulfate  220 mg Oral Daily    Results for orders placed or performed during the hospital  encounter of 09/28/21 (from the past 48 hour(s))  Urinalysis, Routine w reflex microscopic     Status: Abnormal   Collection Time: 09/28/21  1:25 PM  Result Value Ref Range   Color, Urine STRAW (A) YELLOW   APPearance CLEAR (A) CLEAR   Specific Gravity, Urine 1.006 1.005 - 1.030   pH 5.0 5.0 - 8.0   Glucose, UA >=500 (A) NEGATIVE mg/dL   Hgb urine dipstick NEGATIVE NEGATIVE   Bilirubin Urine NEGATIVE NEGATIVE   Ketones, ur NEGATIVE NEGATIVE mg/dL   Protein, ur 30 (A) NEGATIVE mg/dL   Nitrite NEGATIVE NEGATIVE   Leukocytes,Ua NEGATIVE NEGATIVE   RBC / HPF 0-5 0 - 5 RBC/hpf   WBC, UA 0-5 0 - 5 WBC/hpf   Bacteria, UA NONE SEEN NONE SEEN  Squamous Epithelial / LPF NONE SEEN 0 - 5   Hyaline Casts, UA PRESENT     Comment: Performed at [facility], [address] [place], [place] [address]  Culture, blood (Routine x 2)     Status: None (Preliminary result)   Collection Time: 09/28/21  1:30 PM   Specimen: BLOOD  Result Value Ref Range   Specimen Description BLOOD BLOOD LEFT FOREARM    Special Requests      BOTTLES DRAWN AEROBIC AND ANAEROBIC Blood Culture results may not be optimal due to an excessive volume of blood received in culture bottles   Culture      NO GROWTH < 12 HOURS Performed at [facility], [address], [place], [place] [address]    Report Status PENDING   Lactic acid, plasma     Status: None   Collection Time: 09/28/21  2:00 PM  Result Value Ref Range   Lactic Acid, Venous 1.6 0.5 - 1.9 mmol/L    Comment: Performed at [facility], [address], [place], [place] [address]  Culture, blood (Routine x 2)     Status: None (Preliminary result)   Collection Time: 09/28/21  2:00 PM   Specimen: BLOOD  Result Value Ref Range   Specimen Description BLOOD LEFT ANTECUBITAL    Special Requests      BOTTLES DRAWN AEROBIC AND ANAEROBIC Blood Culture results may not be optimal due to an excessive volume of blood received in culture bottles    Culture      NO GROWTH < 24 HOURS Performed at [facility], [address] [place], [place] [address]    Report Status PENDING   Comprehensive metabolic panel     Status: Abnormal   Collection Time: 09/28/21  2:04 PM  Result Value Ref Range   Sodium 138 135 - 145 mmol/L   Potassium 4.5 3.5 - 5.1 mmol/L   Chloride 100 98 - 111 mmol/L   CO2 32 22 - 32 mmol/L   Glucose, Bld 421 (H) 70 - 99 mg/dL    Comment: Glucose reference range applies only to samples taken after fasting for at least 8 hours.   BUN 24 (H) 8 - 23 mg/dL   Creatinine, Ser 1.01 0.61 - 1.24 mg/dL   Calcium 8.5 (L) 8.9 - 10.3 mg/dL   Total Protein 7.1 6.5 - 8.1 g/dL   Albumin 2.9 (L) 3.5 - 5.0 g/dL   AST 18 15 - 41 U/L   ALT 19 0 - 44 U/L   Alkaline Phosphatase 187 (H) 38 - 126 U/L   Total Bilirubin 0.9 0.3 - 1.2 mg/dL   GFR, Estimated >60 >60 mL/min    Comment: (NOTE) Calculated using the CKD-EPI Creatinine Equation (2021)    Anion gap 6 5 - 15    Comment: Performed at [facility], [address] [place], [place] [address]  CBC with Differential     Status: Abnormal   Collection Time: 09/28/21  2:04 PM  Result Value Ref Range   WBC 9.3 4.0 - 10.5 K/uL   RBC 4.23 4.22 - 5.81 MIL/uL   Hemoglobin 11.6 (L) 13.0 - 17.0 g/dL   HCT 38.6 (L) 39.0 - 52.0 %   MCV 91.3 80.0 - 100.0 fL   MCH 27.4 26.0 - 34.0 pg   MCHC 30.1 30.0 - 36.0 g/dL   RDW 16.5 (H) 11.5 - 15.5 %   Platelets 274 150 - 400 K/uL   nRBC 0.0 0.0 -  0.2 %   Neutrophils Relative % 83 %   Neutro Abs 7.7 1.7 - 7.7 K/uL   Lymphocytes Relative 7 %   Lymphs Abs 0.7 0.7 - 4.0 K/uL   Monocytes Relative 7 %   Monocytes Absolute 0.7 0.1 - 1.0 K/uL   Eosinophils Relative 1 %   Eosinophils Absolute 0.1 0.0 - 0.5 K/uL   Basophils Relative 1 %   Basophils Absolute 0.1 0.0 - 0.1 K/uL   Immature Granulocytes 1 %   Abs Immature Granulocytes 0.05 0.00 - 0.07 K/uL    Comment: Performed at [facility], [address]  [place], [place] [address]  Protime-INR     Status: Abnormal   Collection Time: 09/28/21  2:04 PM  Result Value Ref Range   Prothrombin Time 15.8 (H) 11.4 - 15.2 seconds   INR 1.3 (H) 0.8 - 1.2    Comment: (NOTE) INR goal varies based on device and disease states. Performed at [facility], [address] [place], [place] [address]   Lactic acid, plasma     Status: None   Collection Time: 09/28/21  3:25 PM  Result Value Ref Range   Lactic Acid, Venous 1.2 0.5 - 1.9 mmol/L    Comment: Performed at [facility], [address] [place], [place] [address]  Sedimentation rate     Status: Abnormal   Collection Time: 09/28/21 10:40 PM  Result Value Ref Range   Sed Rate 45 (H) 0 - 20 mm/hr    Comment: Performed at [facility], [address] [place], [place] [address]  C-reactive protein     Status: Abnormal   Collection Time: 09/28/21 10:40 PM  Result Value Ref Range   CRP 16.7 (H) <1.0 mg/dL    Comment: Performed at [facility], [address] [place], [place] [address]  Prealbumin     Status: Abnormal   Collection Time: 09/28/21 10:40 PM  Result Value Ref Range   Prealbumin 6.5 (L) 18 - 38 mg/dL    Comment: Performed at [facility] [address] [place], [place] [address]  Glucose, capillary     Status: Abnormal   Collection Time: 09/28/21 10:49 PM  Result Value Ref Range   Glucose-Capillary 389 (H) 70 - 99 mg/dL    Comment: Glucose reference range applies only to samples taken after fasting for at least 8 hours.  CBC     Status: Abnormal   Collection Time: 09/29/21  6:10 AM  Result Value Ref Range   WBC 6.8 4.0 - 10.5 K/uL   RBC 4.26 4.22 - 5.81 MIL/uL   Hemoglobin 11.5 (L) 13.0 - 17.0 g/dL   HCT 39.2 39.0 - 52.0 %   MCV 92.0 80.0 - 100.0 fL   MCH 27.0 26.0 - 34.0 pg   MCHC 29.3 (L) 30.0 - 36.0 g/dL   RDW 16.7 (H) 11.5 - 15.5 %   Platelets 252 150 - 400 K/uL   nRBC 0.0 0.0 - 0.2 %    Comment: Performed at [facility], [address], [place],  [address]  Basic metabolic panel     Status: Abnormal   Collection Time: 09/29/21  6:10 AM  Result Value Ref Range   Sodium 142 135 - 145 mmol/L   Potassium 4.1 3.5 - 5.1 mmol/L   Chloride 103 98 - 111 mmol/L   CO2 33 (H) 22 - 32 mmol/L   Glucose, Bld 172 (H) 70 - 99 mg/dL  Comment: Glucose reference range applies only to samples taken after fasting for at least 8 hours.   BUN 22 8 - 23 mg/dL   Creatinine, Ser 0.99 0.61 - 1.24 mg/dL   Calcium 8.3 (L) 8.9 - 10.3 mg/dL   GFR, Estimated >60 >60 mL/min    Comment: (NOTE) Calculated using the CKD-EPI Creatinine Equation (2021)    Anion gap 6 5 - 15    Comment: Performed at [facility], [address] [place], [place] [address]  Brain natriuretic peptide     Status: Abnormal   Collection Time: 09/29/21  6:10 AM  Result Value Ref Range   B Natriuretic Peptide 278.1 (H) 0.0 - 100.0 pg/mL    Comment: Performed at [facility], [address] [place], [place] [address]  Glucose, capillary     Status: Abnormal   Collection Time: 09/29/21  8:24 AM  Result Value Ref Range   Glucose-Capillary 155 (H) 70 - 99 mg/dL    Comment: Glucose reference range applies only to samples taken after fasting for at least 8 hours.    MR FOOT LEFT WO CONTRAST  Result Date: 09/29/2021 CLINICAL DATA:  Osteomyelitis of the foot. EXAM: MRI OF THE LEFT FOOT WITHOUT CONTRAST TECHNIQUE: Multiplanar, multisequence MR imaging of the left forefoot was performed. No intravenous contrast was administered. COMPARISON:  Radiograph 09/28/2021 FINDINGS: Despite efforts by the technologist and patient, motion artifact is present on today's exam and could not be eliminated. This reduces exam sensitivity and specificity. Bones/Joint/Cartilage Substantially worsened osteomyelitis of the calcaneus with abnormal edema throughout the posterior calcaneus and extending to the calcaneal neck. Exposed bony surface of the  posteroinferior calcaneus due to the large ulceration shown on image 21 series 13. Degenerative lesions along the calcaneal side of the posterior subtalar joint. Mild degenerative loss of articular cartilage thickness at the tibiotalar joint. Mild degenerative spurring in the midfoot. Ligaments Grossly intact, motion artifact precludes detailed assessment. Muscles and Tendons Substantial distal Achilles tendinopathy with fusiform expansion of the Achilles tendon minimally worsened from prior. Defect and possible rupture of the proximal attachment of the plantar fascia especially the medial band. Common peroneus tendon sheath tenosynovitis. Mild distal tibialis posterior tenosynovitis. Soft tissues Dominant finding is the deep ulceration along the plantar heel extending to the bony margin of the calcaneus on image 22 series 13. This large soft tissue defect is accompanied by substantial subcutaneous edema along the heel and tracking to a lesser extent along the ankle diffusely. IMPRESSION: 1. Substantially increased osteomyelitis of the calcaneus. At least partial rupture of the plantar fascia. A large plantar heel ulcer extends to the bony margin of the calcaneus. 2. Continued substantial distal Achilles tendinopathy. 3. Mild degenerative findings at the posterior subtalar joint. Mild degenerative spurring in the midfoot. 4. Diffuse cellulitis especially along the heel. Electronically Signed   By: Van Clines M.D.   On: 09/29/2021 07:38   DG Chest Port 1 View  Result Date: 09/28/2021 CLINICAL DATA:  Lower extremity swelling EXAM: PORTABLE CHEST 1 VIEW COMPARISON:  08/08/2021 FINDINGS: Stable cardiomegaly status post sternotomy and CABG. Pulmonary vascular congestion with bilateral perihilar and bibasilar interstitial opacities. No large pleural fluid collection. Please note the right costophrenic angle was excluded from the field of view. No pneumothorax. IMPRESSION: Findings suggestive of CHF with  pulmonary edema. Electronically Signed   By: Davina Poke D.O.   On: 09/28/2021 14:19   DG Foot Complete Left  Result Date: 09/28/2021 CLINICAL DATA:  History of foot  wound and osteo. EXAM: LEFT FOOT - COMPLETE 3+ VIEW COMPARISON:  August 07, 2021. FINDINGS: Soft tissue ulceration in the plantar heel with gas and swelling extending to the osseous surface and cortical irregularity along the inferior aspect of the calcaneus. Prior transmetatarsal amputation of the third digit and distal fifth metatarsal. Soft tissue swelling about the foot. IMPRESSION: 1. Soft tissue ulceration in the plantar heel with gas and swelling extending to the osseous surface and cortical irregularity along the inferior aspect of the calcaneus, concerning for osteomyelitis. 2. Prior transmetatarsal amputation of the third digit and distal fifth metatarsal. Electronically Signed   By: Dahlia Bailiff M.D.   On: 09/28/2021 18:20    Review of Systems  Constitutional:  Negative for chills and fever.  HENT:  Negative for sinus pain and sore throat.   Respiratory:  Positive for shortness of breath.   Cardiovascular:  Positive for leg swelling.  Gastrointestinal:  Negative for nausea and vomiting.  Endocrine: Negative for polydipsia and polyuria.  Genitourinary:  Negative for frequency and urgency.  Musculoskeletal:        Multiple previous toe amputations with ray resections.  Skin:        Patient has noted some significant increased swelling as well as some redness in the left lower extremity.  Chronic wound on the left heel and has been using a wound VAC.  Neurological:        Significant neuropathy associated with his diabetes.  Psychiatric/Behavioral:  Negative for confusion. The patient is not nervous/anxious.   Blood pressure 140/83, pulse (!) 108, temperature 98.3 F (36.8 C), resp. rate 18, height '6\' 4"'$  (1.93 m), weight (!) 181.4 kg, SpO2 94 %. Physical Exam Cardiovascular:     Comments: Pulses not  palpable. Skin:    Comments: Significant edema bilateral with lower extremity hyperpigmentation and erythema in the left lower extremity.  Absent hair growth.  Full-thickness wound plantar aspect of the left heel approximately 6 cm x 3.5 cm x 7 cm depth probing along the plantar surface of the calcaneus.  Neurological:     Comments: Absent sensation in the feet.    Assessment/Plan: Assessment: 1.  Full-thickness ulceration left heel with necrosis of bone and chronic osteomyelitis. 2.  Diabetes with associated neuropathy. 3.  Peripheral vascular disease.  Plan: The wound was packed with sterile saline gauze with a bulky gauze bandage.  Discussed with the patient that at this point I do not believe the foot is salvageable and he will require a higher amputation.  We will consult with vascular surgery.  Durward Fortes 09/29/2021, 10:57 AM

## 2021-09-29 NOTE — Progress Notes (Signed)
      Per Dr. Lucky Cowboy, he had conversation with Podiatry regarding this patient's need for amputation.  After reviewing chart, pt should be able to heal L BKA.  - Keep patient NPO after MN - Will see if can add on patient tomorrow   Adele Barthel, MD, FACS, FSVS Covering for [facility]: [phone]  09/29/2021, 7:58 PM

## 2021-09-29 NOTE — Progress Notes (Signed)
Inpatient Diabetes Program Recommendations  AACE/ADA: New Consensus Statement on Inpatient Glycemic Control (2015)  Target Ranges:  Prepandial:   less than 140 mg/dL      Peak postprandial:   less than 180 mg/dL (1-2 hours)      Critically ill patients:  140 - 180 mg/dL   Lab Results  Component Value Date   GLUCAP 217 (H) 09/29/2021   HGBA1C 10.2 (H) 08/05/2021    Review of Glycemic Control  Latest Reference Range & Units 09/28/21 22:49 09/29/21 08:24 09/29/21 11:47  Glucose-Capillary 70 - 99 mg/dL 389 (H) 155 (H) 217 (H)  (H): Data is abnormally high  Diabetes history: DM2 Outpatient Diabetes medications: Lantus 25 units bid, Humalog 15 units am, 15 units pm Current orders for Inpatient glycemic control: Semglee 25 units bid, Novolog 0-20 units tid  Inpatient Diabetes Program Recommendations:   Please consider: -Novolog 3 units tid meal coverage if eats 50% -Add Novolog hs correction 0-5 units Secure chat to Dr. Roosevelt Locks.  Thank you, Nani Gasser. Galaxy Borden, RN, MSN, CDE  Diabetes Coordinator Inpatient Glycemic Control Team Team Pager [phone] (8am-5pm) 09/29/2021 1:04 PM

## 2021-09-29 NOTE — Evaluation (Signed)
Occupational Therapy Evaluation Patient Details Name: [name]. MRN: [id number] DOB: 1956-01-25 Today's Date: 09/29/2021   History of Present Illness Lain Tetterton is a 34yoM who comes to [facility] on 09/28/21 c progression of Left chornic heel ulcer. MRI showed significant progressive osteomyelitis in the left calcaneus. PMH HTN, IDDM2, morbid obesity, depression, CAD s/p CABG 2006 and PCI to RCA in 12/2020, chronic B venous stasis on Unna boots, chronic L foot ulcer, COPD, OSA on CPAP nightly, ai-fib, PAD, s/p multiple toe amputations.   Clinical Impression   Patient presenting with decreased Ind in self care, balance, functional mobility/transfers, endurance, and safety awareness. Patient reports being able to stands with RW and moves around in home and community with knee scooter to maintain NWB at home. Pt reports needing assistance from wife for LB self care and IADLs. He appears to be upset and down about plan for amputation. OT attempting to provide encouragement. Patient attempting to eat lunch and falls asleep while speaking to therapist. OT assisting with repositioning in bed for safety with swallowing. Pt declined OOB/EOB activity. Patient will benefit from acute OT to increase overall independence in the areas of ADLs, functional mobility, and safety awareness in order to safely discharge to next venue of care.      Recommendations for follow up therapy are one component of a multi-disciplinary discharge planning process, led by the attending physician.  Recommendations may be updated based on patient status, additional functional criteria and insurance authorization.   Follow Up Recommendations  Skilled nursing-short term rehab (<3 hours/day)    Assistance Recommended at Discharge Frequent or constant Supervision/Assistance  Patient can return home with the following A lot of help with walking and/or transfers;A lot of help with bathing/dressing/bathroom;Assistance with  cooking/housework;Help with stairs or ramp for entrance;Assist for transportation    Functional Status Assessment  Patient has had a recent decline in their functional status and demonstrates the ability to make significant improvements in function in a reasonable and predictable amount of time.  Equipment Recommendations  Other (comment) (defer to next venue of care)       Precautions / Restrictions Precautions Precautions: Fall Restrictions Weight Bearing Restrictions: No      Mobility Bed Mobility               General bed mobility comments: max A bed mobility    Transfers                   General transfer comment: declined          ADL either performed or assessed with clinical judgement      Vision Patient Visual Report: No change from baseline              Pertinent Vitals/Pain Pain Assessment Pain Assessment: No/denies pain     Hand Dominance Right   Extremity/Trunk Assessment Upper Extremity Assessment Upper Extremity Assessment: Generalized weakness   Lower Extremity Assessment Lower Extremity Assessment: Generalized weakness       Communication Communication Communication: No difficulties   Cognition Arousal/Alertness: Awake/alert Behavior During Therapy: WFL for tasks assessed/performed Overall Cognitive Status: Within Functional Limits for tasks assessed                                                  Home Living Family/patient expects to be discharged to:: Private  residence Living Arrangements: Spouse/significant other Available Help at Discharge: Family;Available PRN/intermittently Type of Home: House Home Access: Stairs to enter [employer] of Steps: 4 Entrance Stairs-Rails: Can reach both;Left;Right Home Layout: One level     Bathroom Shower/Tub: Tub/shower unit;Sponge bathes at baseline   Bathroom Toilet: Handicapped height     Home Equipment: Conservation officer, nature (2 wheels);Cane  - quad;Shower seat - built in   Additional Comments: Bariatric RW; knee scooter      Prior Functioning/Environment Prior Level of Function : Independent/Modified Independent             Mobility Comments: Pt reports he has been ambulating with RW or using knee scooter (mostly in community) ADLs Comments: wife assists for LB dressing. He has been washing at sink to keep LE dry prior to admission. Wife assists with IADLs        OT Problem List: Decreased strength;Decreased activity tolerance;Impaired balance (sitting and/or standing);Decreased safety awareness;Cardiopulmonary status limiting activity;Decreased cognition      OT Treatment/Interventions: Self-care/ADL training;Balance training;Energy conservation;DME and/or AE instruction;Manual therapy;Patient/family education;Therapeutic activities;Neuromuscular education;Therapeutic exercise    OT Goals(Current goals can be found in the care plan section) Acute Rehab OT Goals Patient Stated Goal: to get better OT Goal Formulation: With patient Time For Goal Achievement: 10/13/21 Potential to Achieve Goals: Good ADL Goals Pt Will Perform Lower Body Dressing: with min assist;sitting/lateral leans Pt Will Transfer to Toilet: with min assist Pt Will Perform Toileting - Clothing Manipulation and hygiene: with min assist  OT Frequency: Min 2X/week       AM-PAC OT "6 Clicks" Daily Activity     Outcome Measure Help from another person eating meals?: None Help from another person taking care of personal grooming?: None Help from another person toileting, which includes using toliet, bedpan, or urinal?: Total Help from another person bathing (including washing, rinsing, drying)?: A Lot Help from another person to put on and taking off regular upper body clothing?: A Little Help from another person to put on and taking off regular lower body clothing?: Total 6 Click Score: 15   End of Session Equipment Utilized During Treatment:  Rolling walker (2 wheels) Nurse Communication: Mobility status  Activity Tolerance: Patient tolerated treatment well Patient left: in bed;with call bell/phone within reach;with bed alarm set  OT Visit Diagnosis: Unsteadiness on feet (R26.81);Repeated falls (R29.6);Muscle weakness (generalized) (M62.81)                Time: 3875-6433 OT Time Calculation (min): 12 min Charges:  OT General Charges $OT Visit: 1 Visit OT Evaluation $OT Eval Moderate Complexity: 1 [address] [place], OTR/L , CBIS ascom [phone]  09/29/21, 4:08 PM

## 2021-09-29 NOTE — TOC Initial Note (Signed)
Transition of Care (TOC) - Initial/Assessment Note    Patient Details  Name: [name]. MRN: [id number] Date of Birth: 07/09/1955  Transition of Care (TOC) CM/SW Contact:     H Reynolds, LCSW Phone Number: 09/29/2021, 11:18 AM  Clinical Narrative:                 CSW met with pt and granddaughter, Shae, at pt's bedside. CSW completed the readmission screening. Pt confirms that he sees Dr. Sparks for is primary care and is active with [facility] for his Home Health needs. Pt stated that he does not have any issues getting his medications. Pt utilizes [facility] as well [facility]. Pt states that his wife, Sherry, transports him to and from his appointments. Pt stated that he currently utilizes a knee scooter at home to get around. Pt states that he is unsure of what he equipment he will utilize now as he is getting his leg amputated. Pt states that his wife, Sherry or his granddaughter, Shea, will transport  him home upon discharge.   Expected Discharge Plan: Home w Home Health Services Barriers to Discharge: Continued Medical Work up   Patient Goals and CMS Choice        Expected Discharge Plan and Services Expected Discharge Plan: Home w Home Health Services In-house Referral: NA   Post Acute Care Choice: Resumption of Svcs/PTA Provider Living arrangements for the past 2 months: Single Family Home                                      Prior Living Arrangements/Services Living arrangements for the past 2 months: Single Family Home Lives with:: Spouse Patient language and need for interpreter reviewed:: Yes Do you feel safe going back to the place where you live?: Yes      Need for Family Participation in Patient Care: Yes (Comment) Care giver support system in place?: Yes (comment) Current home services: DME Criminal Activity/Legal Involvement Pertinent to Current Situation/Hospitalization: No - Comment as needed  Activities of Daily  Living Home Assistive Devices/Equipment: None ADL Screening (condition at time of admission) Patient's cognitive ability adequate to safely complete daily activities?: Yes Is the patient deaf or have difficulty hearing?: No Does the patient have difficulty seeing, even when wearing glasses/contacts?: No Does the patient have difficulty concentrating, remembering, or making decisions?: No Patient able to express need for assistance with ADLs?: Yes Does the patient have difficulty dressing or bathing?: No Independently performs ADLs?: Yes (appropriate for developmental age) Communication: Independent Dressing (OT): Independent Grooming: Independent Feeding: Independent Bathing: Independent Toileting: Independent In/Out Bed: Independent Walks in Home: Independent Does the patient have difficulty walking or climbing stairs?: Yes Weakness of Legs: Both Weakness of Arms/Hands: None  Permission Sought/Granted                  Emotional Assessment Appearance:: Appears stated age Attitude/Demeanor/Rapport: Engaged, Gracious Affect (typically observed): Pleasant, Adaptable, Appropriate Orientation: : Oriented to Self, Oriented to Place, Oriented to  Time, Oriented to Situation Alcohol / Substance Use: Never Used Psych Involvement: No (comment)  Admission diagnosis:  Osteomyelitis (HCC) [M86.9] Hypoxia [R09.02] Cellulitis, unspecified cellulitis site [L03.90] Osteomyelitis, unspecified site, unspecified type (HCC) [M86.9] Acute on chronic congestive heart failure, unspecified heart failure type (HCC) [I50.9] Patient Active Problem List   Diagnosis Date Noted   Cellulitis of left foot 08/04/2021   Uncontrolled type 2   diabetes mellitus with hyperglycemia, with long-term current use of insulin (HCC) 08/04/2021   Bilateral lower extremity edema 08/04/2021   Acute osteomyelitis of lower leg, left (HCC) 08/04/2021   Swelling of limb 07/18/2021   CHF exacerbation (HCC) 05/08/2021    Diabetes mellitus without complication (HCC)    Atrial fibrillation with RVR (HCC)    Obesity, Class III, BMI 40-49.9 (morbid obesity) (HCC)    Venous stasis ulcer (HCC)    Sinus tachycardia    BPH (benign prostatic hyperplasia)    Chronic anticoagulation    Anasarca    Acute on chronic diastolic CHF (congestive heart failure) (HCC)    Foot ulcer with fat layer exposed, left (HCC)    Venous stasis of both lower extremities    AF (paroxysmal atrial fibrillation) (HCC)    Sepsis due to cellulitis (HCC) 01/03/2021   Chronic osteomyelitis of left foot (HCC) 01/03/2021   Osteomyelitis (HCC) 01/03/2021   Severe sepsis (HCC) 02/24/2020   AKI (acute kidney injury) (HCC) 02/24/2020   Bilateral cellulitis of lower leg 02/23/2020   CAD (coronary artery disease) 02/02/2020   RLS (restless legs syndrome) 05/15/2019   Statin myopathy 05/15/2019   Diabetes mellitus type 2 in obese (HCC) 02/13/2016   Essential hypertension, benign 02/13/2016   PAD (peripheral artery disease) (HCC) 02/13/2016   Pressure injury of skin 01/25/2016   Hypoglycemia associated with diabetes (HCC) 12/27/2013   Long-term insulin use (HCC) 12/27/2013   Microalbuminuria 12/27/2013   B12 deficiency 09/29/2013   Obesity 09/29/2013   CAD (coronary artery disease), autologous vein bypass graft 07/20/2013   Hypersomnia with sleep apnea 07/20/2013   Pure hypercholesterolemia 07/20/2013   Osteomyelitis of ankle or foot 07/20/2011   PCP:  Sparks, Jeffrey D, MD Pharmacy:   [id number] - [place], [place] - [address] [place] [address] Phone: [phone] Fax: [phone]  [facility] [place] - [address] [place] [address] Phone: [phone] Fax: [phone]  [facility] - [place], [place] - [address] [place] [address] Phone: [phone] Fax: [phone]     Social  Determinants of Health (SDOH) Interventions    Readmission Risk Interventions    09/29/2021   11:12 AM  Readmission Risk Prevention Plan  Transportation Screening Complete  PCP or Specialist Appt within 3-5 Days Complete  HRI or Home Care Consult Complete  Social Work Consult for Recovery Care Planning/Counseling Complete  Palliative Care Screening Not Applicable  Medication Review (RN Care Manager) Complete     

## 2021-09-29 NOTE — Progress Notes (Signed)
Pharmacy Antibiotic Note  [name]. is a 66 y.o. male admitted on 09/28/2021 with bacteremia (MRSA).  Pharmacy has been consulted for Vancomycin dosing.  Plan: {Assessment:21075}  Height: '6\' 4"'$  (193 cm) Weight: (!) 181.4 kg (400 lb) IBW/kg (Calculated) : 86.8  Temp (24hrs), Avg:98.5 F (36.9 C), Min:97.9 F (36.6 C), Max:99.6 F (37.6 C)  Recent Labs  Lab 09/26/21 1401 09/28/21 1400 09/28/21 1404 09/28/21 1525 09/29/21 0610  WBC  --   --  9.3  --  6.8  CREATININE 1.15  --  1.01  --  0.99  LATICACIDVEN  --  1.6  --  1.2  --     Estimated Creatinine Clearance: 129.4 mL/min (by C-G formula based on SCr of 0.99 mg/dL).    Allergies  Allergen Reactions   Penicillins Other (See Comments)    Happened at 66 years old and pt. stated he passed out  He has tolerated amoxicillin/clavulanate and ampicillin/sulbactam   Statins     Other reaction(Wood): Muscle Pain Causes legs to ache per pt    Antimicrobials this admission: *** *** >> *** *** *** >> ***  Dose adjustments this admission: ***  Microbiology results: *** BCx: *** *** UCx: ***  *** Sputum: ***  *** MRSA PCR: ***  Thank you for allowing pharmacy to be a part of this patient'Wood care.  [name],[name] 09/29/2021 11:11 PM

## 2021-09-30 DIAGNOSIS — G9341 Metabolic encephalopathy: Secondary | ICD-10-CM | POA: Diagnosis not present

## 2021-09-30 DIAGNOSIS — I480 Paroxysmal atrial fibrillation: Secondary | ICD-10-CM | POA: Diagnosis not present

## 2021-09-30 DIAGNOSIS — M86672 Other chronic osteomyelitis, left ankle and foot: Secondary | ICD-10-CM | POA: Diagnosis not present

## 2021-09-30 DIAGNOSIS — I5033 Acute on chronic diastolic (congestive) heart failure: Secondary | ICD-10-CM | POA: Diagnosis not present

## 2021-09-30 LAB — BASIC METABOLIC PANEL
Anion gap: 7 (ref 5–15)
BUN: 26 mg/dL — ABNORMAL HIGH (ref 8–23)
CO2: 33 mmol/L — ABNORMAL HIGH (ref 22–32)
Calcium: 8.7 mg/dL — ABNORMAL LOW (ref 8.9–10.3)
Chloride: 98 mmol/L (ref 98–111)
Creatinine, Ser: 1.34 mg/dL — ABNORMAL HIGH (ref 0.61–1.24)
GFR, Estimated: 58 mL/min — ABNORMAL LOW (ref >60)
Glucose, Bld: 214 mg/dL — ABNORMAL HIGH (ref 70–99)
Potassium: 5.1 mmol/L (ref 3.5–5.1)
Sodium: 138 mmol/L (ref 135–145)

## 2021-09-30 LAB — GLUCOSE, CAPILLARY
Glucose-Capillary: 218 mg/dL — ABNORMAL HIGH (ref 70–99)
Glucose-Capillary: 193 mg/dL — ABNORMAL HIGH (ref 70–99)
Glucose-Capillary: 204 mg/dL — ABNORMAL HIGH (ref 70–99)
Glucose-Capillary: 278 mg/dL — ABNORMAL HIGH (ref 70–99)

## 2021-09-30 LAB — MAGNESIUM: Magnesium: 2 mg/dL (ref 1.7–2.4)

## 2021-09-30 MED ORDER — SODIUM CHLORIDE 0.9 % IV SOLN
2.0000 g | Freq: Every day | INTRAVENOUS | Status: DC
  Administered 2021-09-30 – 2021-10-02 (×3): 2 g via INTRAVENOUS
  Filled 2021-09-30 (×2): qty 2
  Filled 2021-09-30 (×2): qty 20
  Filled 2021-09-30: qty 2

## 2021-09-30 MED ORDER — IPRATROPIUM-ALBUTEROL 0.5-2.5 (3) MG/3ML IN SOLN
3.0000 mL | Freq: Four times a day (QID) | RESPIRATORY_TRACT | Status: DC
  Administered 2021-09-30 – 2021-10-02 (×7): 3 mL via RESPIRATORY_TRACT
  Filled 2021-09-30 (×7): qty 3

## 2021-09-30 MED ORDER — VANCOMYCIN HCL 1500 MG/300ML IV SOLN
1500.0000 mg | Freq: Two times a day (BID) | INTRAVENOUS | Status: DC
  Filled 2021-09-30: qty 300

## 2021-09-30 MED ORDER — VANCOMYCIN HCL 1250 MG/250ML IV SOLN
1250.0000 mg | Freq: Two times a day (BID) | INTRAVENOUS | Status: DC
  Administered 2021-09-30 – 2021-10-02 (×5): 1250 mg via INTRAVENOUS
  Filled 2021-09-30 (×6): qty 250

## 2021-09-30 NOTE — Progress Notes (Signed)
Pharmacy Antibiotic Note  [name]. is a 66 y.o. male admitted on 09/28/2021 with bacteremia (MRSA).  Pharmacy has been consulted for Vancomycin dosing.  Renal function worsening today, SCR 0.99>1.34, will adjust dosing as appropriate  Plan: Vancomycin 1250 mg IV Q 12 hrs.  Goal AUC 400-550. Expected AUC: 462.8 SCr used: 1.34 Vd used: 0.5, BMI 48.60  Pharmacy will continue to follow and will adjust abx dosing whenever warranted.   Height: '6\' 4"'$  (193 cm) Weight: (!) 181.1 kg (399 lb 4.1 oz) IBW/kg (Calculated) : 86.8  Temp (24hrs), Avg:98.3 F (36.8 C), Min:97.7 F (36.5 C), Max:99.6 F (37.6 C)  Recent Labs  Lab 09/26/21 1401 09/28/21 1400 09/28/21 1404 09/28/21 1525 09/29/21 0610 09/30/21 0514  WBC  --   --  9.3  --  6.8  --   CREATININE 1.15  --  1.01  --  0.99 1.34*  LATICACIDVEN  --  1.6  --  1.2  --   --      Estimated Creatinine Clearance: 95.5 mL/min (A) (by C-G formula based on SCr of 1.34 mg/dL (H)).    Allergies  Allergen Reactions   Penicillins Other (See Comments)    Happened at 66 years old and pt. stated he passed out  He has tolerated amoxicillin/clavulanate and ampicillin/sulbactam   Statins     Other reaction(s): Muscle Pain Causes legs to ache per pt    Antimicrobials this admission: 6/1 1837 Vancomycin >> 1000 mg x 1 dose 6/1 Zosyn >> x 1 dose 6/2 Ceftriaxone >>  x 1 dose 6/2 Flagyl >> x 7 days 6/3 Vancomycin >>  Microbiology results: 6/01 BCx: 1 (aerobic) of 4 bottles w/ MRSA  Thank you for allowing pharmacy to be a part of this patient's care.  Pearla Dubonnet, PharmD Clinical Pharmacist 09/30/2021 9:38 AM

## 2021-09-30 NOTE — Progress Notes (Addendum)
[facility] Cardiology  SUBJECTIVE: Patient laying in bed, denies chest pain   Vitals:   09/30/21 0117 09/30/21 0444 09/30/21 0703 09/30/21 0817  BP: (!) 124/93 126/88  118/84  Pulse: (!) 112 (!) 112  (!) 114  Resp: (!) 22 (!) '22 19 19  '$ Temp: 98.5 F (36.9 C) 97.7 F (36.5 C)  97.8 F (36.6 C)  TempSrc:      SpO2: 100% 94%  95%  Weight:      Height:         Intake/Output Summary (Last 24 hours) at 09/30/2021 0934 Last data filed at 09/30/2021 0600 Gross per 24 hour  Intake 240 ml  Output 1800 ml  Net -1560 ml      PHYSICAL EXAM  General: Well developed, well nourished, in no acute distress HEENT:  Normocephalic and atramatic Neck:  No JVD.  Lungs: Clear bilaterally to auscultation and percussion. Heart: HRRR . Normal S1 and S2 without gallops or murmurs.  Abdomen: Bowel sounds are positive, abdomen soft and non-tender  Msk:  Back normal, normal gait. Normal strength and tone for age. Extremities: No clubbing, cyanosis or edema.   Neuro: Alert and oriented X 3. Psych:  Good affect, responds appropriately   LABS: Basic Metabolic Panel: Recent Labs    09/29/21 0610 09/30/21 0514  NA 142 138  K 4.1 5.1  CL 103 98  CO2 33* 33*  GLUCOSE 172* 214*  BUN 22 26*  CREATININE 0.99 1.34*  CALCIUM 8.3* 8.7*  MG  --  2.0   Liver Function Tests: Recent Labs    09/28/21 1404  AST 18  ALT 19  ALKPHOS 187*  BILITOT 0.9  PROT 7.1  ALBUMIN 2.9*   No results for input(s): LIPASE, AMYLASE in the last 72 hours. CBC: Recent Labs    09/28/21 1404 09/29/21 0610  WBC 9.3 6.8  NEUTROABS 7.7  --   HGB 11.6* 11.5*  HCT 38.6* 39.2  MCV 91.3 92.0  PLT 274 252   Cardiac Enzymes: No results for input(s): CKTOTAL, CKMB, CKMBINDEX, TROPONINI in the last 72 hours. BNP: Invalid input(s): POCBNP D-Dimer: No results for input(s): DDIMER in the last 72 hours. Hemoglobin A1C: No results for input(s): HGBA1C in the last 72 hours. Fasting Lipid Panel: No results for input(s):  CHOL, HDL, LDLCALC, TRIG, CHOLHDL, LDLDIRECT in the last 72 hours. Thyroid Function Tests: No results for input(s): TSH, T4TOTAL, T3FREE, THYROIDAB in the last 72 hours.  Invalid input(s): FREET3 Anemia Panel: No results for input(s): VITAMINB12, FOLATE, FERRITIN, TIBC, IRON, RETICCTPCT in the last 72 hours.  MR FOOT LEFT WO CONTRAST  Result Date: 09/29/2021 CLINICAL DATA:  Osteomyelitis of the foot. EXAM: MRI OF THE LEFT FOOT WITHOUT CONTRAST TECHNIQUE: Multiplanar, multisequence MR imaging of the left forefoot was performed. No intravenous contrast was administered. COMPARISON:  Radiograph 09/28/2021 FINDINGS: Despite efforts by the technologist and patient, motion artifact is present on today's exam and could not be eliminated. This reduces exam sensitivity and specificity. Bones/Joint/Cartilage Substantially worsened osteomyelitis of the calcaneus with abnormal edema throughout the posterior calcaneus and extending to the calcaneal neck. Exposed bony surface of the posteroinferior calcaneus due to the large ulceration shown on image 21 series 13. Degenerative lesions along the calcaneal side of the posterior subtalar joint. Mild degenerative loss of articular cartilage thickness at the tibiotalar joint. Mild degenerative spurring in the midfoot. Ligaments Grossly intact, motion artifact precludes detailed assessment. Muscles and Tendons Substantial distal Achilles tendinopathy with fusiform expansion of the Achilles tendon  minimally worsened from prior. Defect and possible rupture of the proximal attachment of the plantar fascia especially the medial band. Common peroneus tendon sheath tenosynovitis. Mild distal tibialis posterior tenosynovitis. Soft tissues Dominant finding is the deep ulceration along the plantar heel extending to the bony margin of the calcaneus on image 22 series 13. This large soft tissue defect is accompanied by substantial subcutaneous edema along the heel and tracking to a  lesser extent along the ankle diffusely. IMPRESSION: 1. Substantially increased osteomyelitis of the calcaneus. At least partial rupture of the plantar fascia. A large plantar heel ulcer extends to the bony margin of the calcaneus. 2. Continued substantial distal Achilles tendinopathy. 3. Mild degenerative findings at the posterior subtalar joint. Mild degenerative spurring in the midfoot. 4. Diffuse cellulitis especially along the heel. Electronically Signed   By: Van Clines M.D.   On: 09/29/2021 07:38   DG Chest Port 1 View  Result Date: 09/28/2021 CLINICAL DATA:  Lower extremity swelling EXAM: PORTABLE CHEST 1 VIEW COMPARISON:  08/08/2021 FINDINGS: Stable cardiomegaly status post sternotomy and CABG. Pulmonary vascular congestion with bilateral perihilar and bibasilar interstitial opacities. No large pleural fluid collection. Please note the right costophrenic angle was excluded from the field of view. No pneumothorax. IMPRESSION: Findings suggestive of CHF with pulmonary edema. Electronically Signed   By: Davina Poke D.O.   On: 09/28/2021 14:19   DG Foot Complete Left  Result Date: 09/28/2021 CLINICAL DATA:  History of foot wound and osteo. EXAM: LEFT FOOT - COMPLETE 3+ VIEW COMPARISON:  August 07, 2021. FINDINGS: Soft tissue ulceration in the plantar heel with gas and swelling extending to the osseous surface and cortical irregularity along the inferior aspect of the calcaneus. Prior transmetatarsal amputation of the third digit and distal fifth metatarsal. Soft tissue swelling about the foot. IMPRESSION: 1. Soft tissue ulceration in the plantar heel with gas and swelling extending to the osseous surface and cortical irregularity along the inferior aspect of the calcaneus, concerning for osteomyelitis. 2. Prior transmetatarsal amputation of the third digit and distal fifth metatarsal. Electronically Signed   By: Dahlia Bailiff M.D.   On: 09/28/2021 18:20     Echo LVEF 50 to 55%  05/10/2021  TELEMETRY: Atrial fibrillation 115 bpm:  ASSESSMENT AND PLAN:  Principal Problem:   Osteomyelitis (HCC) Active Problems:   Essential hypertension, benign   RLS (restless legs syndrome)   Obesity, Class III, BMI 40-49.9 (morbid obesity) (HCC)   Acute on chronic diastolic CHF (congestive heart failure) (HCC)   Foot ulcer with fat layer exposed, left (HCC)   AF (paroxysmal atrial fibrillation) ([place])   Uncontrolled type 2 diabetes mellitus with hyperglycemia, with long-term current use of insulin (HCC)   Malnutrition of moderate degree   Generalized weakness   Acute metabolic encephalopathy    1.  Acute on chronic diastolic congestive heart failure, HFpEF, LVEF 50-55% 05/10/2021, good diuresis after IV furosemide 2.  Paroxysmal atrial fibrillation/atrial flutter, currently in atrial flutter heart rate 115 bpm, on amiodarone and metoprolol for rate control and Eliquis for stroke prevention 3.  Coronary artery disease, status post CABG 2006, PCI RCA 01/2020, currently without chest pain 4.  Left foot osteomyelitis on IV vancomycin, left BKA pending 5.  Acute kidney injury, BUN and creatinine 26 and 1.34, respectively  Recommendations  1.  Agree with current therapy 2.  Diuresis as needed 3.  Continue Eliquis for stroke prevention 4.  Continue amiodarone and metoprolol succinate for rate and rhythm control 5.  Possible  left BKA/08/04/18/2023   Isaias Cowman, MD, PhD, [facility] 09/30/2021 9:34 AM

## 2021-09-30 NOTE — Progress Notes (Signed)
CSW spoke with patients provider who stated to hold off on SNF placement at this time. Patients wife stated if her husband goes to SNF she does NOT want [facility].

## 2021-09-30 NOTE — NC FL2 (Signed)
[place] LEVEL OF CARE SCREENING TOOL     IDENTIFICATION  Patient Name: [name]. Birthdate: [birth year] Sex: male Admission Date (Current Location): 09/28/2021  [facility] and [place] Number:  Engineering geologist and Address:  [facility], [address], [place], [place] [address]      Provider Number: [id number]  Attending Physician Name and Address:  Sharen Hones, MD  Relative Name and Phone Number:  Sandrea Matte (Spouse)   [phone]    Current Level of Care: Hospital Recommended Level of Care: [place] Prior Approval Number:    Date Approved/Denied:   PASRR Number: [id number]  Discharge Plan: SNF    Current Diagnoses: Patient Active Problem List   Diagnosis Date Noted   Malnutrition of moderate degree 09/29/2021   Generalized weakness 16/55/3748   Acute metabolic encephalopathy 27/10/8673   Cellulitis of left foot 08/04/2021   Uncontrolled type 2 diabetes mellitus with hyperglycemia, with long-term current use of insulin ([place]) 08/04/2021   Bilateral lower extremity edema 08/04/2021   Acute osteomyelitis of lower leg, left ([place]) 08/04/2021   Swelling of limb 07/18/2021   CHF exacerbation ([place]) 05/08/2021   Diabetes mellitus without complication (HCC)    Atrial fibrillation with RVR ([place])    Obesity, Class III, BMI 40-49.9 (morbid obesity) ([place])    Venous stasis ulcer (HCC)    Sinus tachycardia    BPH (benign prostatic hyperplasia)    Chronic anticoagulation    Anasarca    Acute on chronic diastolic CHF (congestive heart failure) ([place])    Foot ulcer with fat layer exposed, left ([place])    Venous stasis of both lower extremities    AF (paroxysmal atrial fibrillation) ([place])    Sepsis due to cellulitis ([place]) 01/03/2021   Chronic osteomyelitis of left foot ([place]) 01/03/2021   Osteomyelitis ([place]) 01/03/2021   Severe sepsis ([place]) 02/24/2020   AKI (acute kidney injury) ([place]) 02/24/2020    Bilateral cellulitis of lower leg 02/23/2020   CAD (coronary artery disease) 02/02/2020   RLS (restless legs syndrome) 05/15/2019   Statin myopathy 05/15/2019   Diabetes mellitus type 2 in obese ([place]) 02/13/2016   Essential hypertension, benign 02/13/2016   PAD (peripheral artery disease) ([place]) 02/13/2016   Pressure injury of skin 01/25/2016   Hypoglycemia associated with diabetes ([place]) 12/27/2013   Long-term insulin use ([place]) 12/27/2013   Microalbuminuria 12/27/2013   B12 deficiency 09/29/2013   Obesity 09/29/2013   CAD (coronary artery disease), autologous vein bypass graft 07/20/2013   Hypersomnia with sleep apnea 07/20/2013   Pure hypercholesterolemia 07/20/2013   Osteomyelitis of ankle or foot 07/20/2011    Orientation RESPIRATION BLADDER Height & Weight     Self, Situation, Place  O2 (4 Liters of oxygen. CPAP when asleep) Incontinent Weight: (!) 399 lb 4.1 oz (181.1 kg) Height:  '6\' 4"'$  (193 cm)  BEHAVIORAL SYMPTOMS/MOOD NEUROLOGICAL BOWEL NUTRITION STATUS      Incontinent Diet (See discharge summary)  AMBULATORY STATUS COMMUNICATION OF NEEDS Skin   Extensive Assist Verbally Other (Comment) (Patient has a wound on his left heel. Left foot amputation scheduled. Wound on his right shin)                       Personal Care Assistance Level of Assistance  Bathing, Feeding, Dressing Bathing Assistance: Maximum assistance Feeding assistance: Independent Dressing Assistance: Maximum assistance     Functional Limitations Info  Sight, Hearing, Speech Sight Info: Adequate Hearing Info: Adequate Speech Info:  Adequate    SPECIAL CARE FACTORS FREQUENCY                       Contractures Contractures Info: Not present    Additional Factors Info  Code Status, Allergies, Insulin Sliding Scale Code Status Info: Full Allergies Info: Penicillins, Statins   Insulin Sliding Scale Info: Novolog Dose: 0-20 Units  Freq: 3 times daily with meals Route: [place], Novolog Dose: 3  Units  Freq: 3 times daily with meals Route: [place], Dose: 25 Units  Freq: 2 times daily Route: [place]       Current Medications (09/30/2021):  This is the current hospital active medication list Current Facility-Administered Medications  Medication Dose Route Frequency Provider Last Rate Last Admin   (feeding supplement) PROSource Plus liquid 30 mL  30 mL Oral TID BM Sharen Hones, MD   30 mL at 09/30/21 0933   acetaminophen (TYLENOL) tablet 650 mg  650 mg Oral Q6H PRN Agbata, Tochukwu, MD   650 mg at 09/28/21 2300   Or   acetaminophen (TYLENOL) suppository 650 mg  650 mg Rectal Q6H PRN Agbata, Tochukwu, MD       amiodarone (PACERONE) tablet 200 mg  200 mg Oral Daily Agbata, Tochukwu, MD   200 mg at 09/30/21 0932   apixaban (ELIQUIS) tablet 5 mg  5 mg Oral BID Agbata, Tochukwu, MD   5 mg at 09/30/21 2947   ascorbic acid (VITAMIN C) tablet 500 mg  500 mg Oral Daily Agbata, Tochukwu, MD   500 mg at 09/30/21 0932   aspirin EC tablet 81 mg  81 mg Oral Daily Agbata, Tochukwu, MD   81 mg at 09/30/21 0932   cefTRIAXone (ROCEPHIN) 2 g in sodium chloride 0.9 % 100 mL IVPB  2 g Intravenous Daily Sharen Hones, MD       chlorhexidine (PERIDEX) 0.12 % solution 15 mL  15 mL Mouth Rinse BID Agbata, Tochukwu, MD   15 mL at 09/30/21 0932   cholecalciferol (VITAMIN D3) tablet 1,000 Units  1,000 Units Oral Daily Agbata, Tochukwu, MD   1,000 Units at 09/30/21 0932   diltiazem (CARDIZEM) tablet 90 mg  90 mg Oral Q8H Sharen Hones, MD   90 mg at 09/30/21 0604   ezetimibe (ZETIA) tablet 10 mg  10 mg Oral Daily Agbata, Tochukwu, MD   10 mg at 09/30/21 0932   feeding supplement (GLUCERNA SHAKE) (GLUCERNA SHAKE) liquid 237 mL  237 mL Oral BID BM Sharen Hones, MD   237 mL at 09/30/21 0933   ferrous sulfate tablet 325 mg  325 mg Oral Q breakfast Agbata, Tochukwu, MD   325 mg at 09/30/21 0932   gabapentin (NEURONTIN) capsule 300 mg  300 mg Oral QHS Agbata, Tochukwu, MD   300 mg at 09/29/21 2153   insulin aspart (novoLOG) injection  0-20 Units  0-20 Units Subcutaneous TID WC Agbata, Tochukwu, MD   4 Units at 09/30/21 1151   insulin aspart (novoLOG) injection 3 Units  3 Units Subcutaneous TID WC Sharen Hones, MD   3 Units at 09/29/21 1702   insulin glargine-yfgn (SEMGLEE) injection 25 Units  25 Units Subcutaneous BID Agbata, Tochukwu, MD   25 Units at 09/30/21 0936   ipratropium-albuterol (DUONEB) 0.5-2.5 (3) MG/3ML nebulizer solution 3 mL  3 mL Nebulization Q6H Sharen Hones, MD   3 mL at 09/30/21 1345   MEDLINE mouth rinse  15 mL Mouth Rinse q12n4p Agbata, Tochukwu, MD   15 mL at 09/30/21 1015  metoprolol tartrate (LOPRESSOR) tablet 100 mg  100 mg Oral BID Agbata, Tochukwu, MD   100 mg at 09/30/21 0932   metroNIDAZOLE (FLAGYL) IVPB 500 mg  500 mg Intravenous Q12H Agbata, Tochukwu, MD 100 mL/hr at 09/30/21 0447 500 mg at 09/30/21 0447   multivitamin with minerals tablet 1 tablet  1 tablet Oral Daily Agbata, Tochukwu, MD   1 tablet at 09/30/21 0932   nitroGLYCERIN (NITROSTAT) SL tablet 0.4 mg  0.4 mg Sublingual Q5 min PRN Agbata, Tochukwu, MD       nystatin (MYCOSTATIN/NYSTOP) topical powder   Topical BID Sharion Settler, NP   Given at 09/30/21 0932   ondansetron (ZOFRAN) tablet 4 mg  4 mg Oral Q6H PRN Agbata, Tochukwu, MD       Or   ondansetron (ZOFRAN) injection 4 mg  4 mg Intravenous Q6H PRN Agbata, Tochukwu, MD       pramipexole (MIRAPEX) tablet 2 mg  2 mg Oral QHS Agbata, Tochukwu, MD   2 mg at 09/29/21 2156   primidone (MYSOLINE) tablet 250 mg  250 mg Oral BID Agbata, Tochukwu, MD   250 mg at 09/30/21 0936   tamsulosin (FLOMAX) capsule 0.4 mg  0.4 mg Oral Daily Agbata, Tochukwu, MD   0.4 mg at 09/30/21 0934   traMADol (ULTRAM) tablet 50 mg  50 mg Oral Daily PRN Agbata, Tochukwu, MD       vancomycin (VANCOREADY) IVPB 1250 mg/250 mL  1,250 mg Intravenous Q12H Nazari, Walid A, RPH 166.7 mL/hr at 09/30/21 1150 1,250 mg at 09/30/21 1150   vitamin B-12 (CYANOCOBALAMIN) tablet 1,000 mcg  1,000 mcg Oral Daily Agbata, Tochukwu,  MD   1,000 mcg at 09/30/21 0932   zinc sulfate capsule 220 mg  220 mg Oral Daily Agbata, Tochukwu, MD   220 mg at 09/30/21 0932     Discharge Medications: Please see discharge summary for a list of discharge medications.  Relevant Imaging Results:  Relevant Lab Results:   Additional Information SS# [id number]. Patient wears CPAP when asleep  Raina Mina, LCSWA

## 2021-09-30 NOTE — Consult Note (Addendum)
VASCULAR SURGERY CONSULTATION   Requested by:  Dr. Sharlotte Alamo  Reason for consultation: Left foot osteomyelitis    History of Present Illness   [name]. is a 66 y.o. ([birth year]) male who presents with cc: non-healing heel ulcer.  Pt is a known patient of both Podiatry and Vascular  Past Medical History:  Diagnosis Date   Arrhythmia    atrial fibrillation   Asthma    CHF (congestive heart failure) (HCC)    COPD (chronic obstructive pulmonary disease) (HCC)    Coronary artery disease    Depression    Diabetes mellitus without complication ([place])    Gout    History anabolic steroid use    Hyperlipidemia    Hypertension    Hypogonadism in male    Morbid obesity ([place])    Myocardial infarction ([place])    Peripheral vascular disease ([place])    Perirectal abscess    Pleurisy    Sleep apnea    CPAP at night, no oxygen   Varicella     Past Surgical History:  Procedure Laterality Date   ABDOMINAL AORTIC ANEURYSM REPAIR     ACHILLES TENDON SURGERY Left 01/10/2021   Procedure: ACHILLES LENGTHENING/KIDNER;  Surgeon: Caroline More, DPM;  Location: [facility];  Service: Podiatry;  Laterality: Left;   AMPUTATION TOE Right 02/10/2016   Procedure: AMPUTATION TOE 3RD TOE;  Surgeon: Samara Deist, DPM;  Location: [facility];  Service: Podiatry;  Laterality: Right;   AMPUTATION TOE Left 02/24/2020   Procedure: AMPUTATION TOE;  Surgeon: Caroline More, DPM;  Location: [facility];  Service: Podiatry;  Laterality: Left;   APPLICATION OF WOUND VAC Left 02/29/2020   Procedure: APPLICATION OF WOUND VAC;  Surgeon: Caroline More, DPM;  Location: [facility];  Service: Podiatry;  Laterality: Left;   COLONOSCOPY WITH PROPOFOL [name] 11/18/2015   Procedure: COLONOSCOPY WITH PROPOFOL;  Surgeon: Manya Silvas, MD;  Location: [facility];  Service: Endoscopy;  Laterality: [name];   CORONARY ARTERY BYPASS GRAFT     CORONARY STENT INTERVENTION [name] 02/02/2020   Procedure: CORONARY STENT  INTERVENTION;  Surgeon: Isaias Cowman, MD;  Location: [place] [facility];  Service: Cardiovascular;  Laterality: [name];   INCISION AND DRAINAGE Left 08/07/2021   Procedure: INCISION AND DRAINAGE-Partial Calcanectomy;  Surgeon: Caroline More, DPM;  Location: [facility];  Service: Podiatry;  Laterality: Left;   IRRIGATION AND DEBRIDEMENT FOOT Left 02/29/2020   Procedure: IRRIGATION AND DEBRIDEMENT FOOT;  Surgeon: Caroline More, DPM;  Location: [facility];  Service: Podiatry;  Laterality: Left;   IRRIGATION AND DEBRIDEMENT FOOT Left 02/24/2020   Procedure: IRRIGATION AND DEBRIDEMENT FOOT;  Surgeon: Caroline More, DPM;  Location: [facility];  Service: Podiatry;  Laterality: Left;   KNEE ARTHROSCOPY     LEFT HEART CATH AND CORS/GRAFTS ANGIOGRAPHY [name] 02/02/2020   Procedure: LEFT HEART CATH AND CORS/GRAFTS ANGIOGRAPHY;  Surgeon: Teodoro Spray, MD;  Location: [place] [facility];  Service: Cardiovascular;  Laterality: [name];   LOWER EXTREMITY ANGIOGRAPHY Left 02/25/2020   Procedure: Lower Extremity Angiography;  Surgeon: Algernon Huxley, MD;  Location: [place] [facility];  Service: Cardiovascular;  Laterality: Left;   LOWER EXTREMITY ANGIOGRAPHY Left 01/04/2021   Procedure: LOWER EXTREMITY ANGIOGRAPHY;  Surgeon: Algernon Huxley, MD;  Location: [place] [facility];  Service: Cardiovascular;  Laterality: Left;   METATARSAL HEAD EXCISION Left 01/10/2021   Procedure: METATARSAL HEAD EXCISION - LEFT 5th;  Surgeon: Caroline More, DPM;  Location: [facility];  Service: Podiatry;  Laterality: Left;   PERIPHERAL VASCULAR CATHETERIZATION Right 01/24/2016   Procedure: Lower Extremity Angiography;  Surgeon: Katha Cabal, MD;  Location: [place] [facility];  Service: Cardiovascular;  Laterality: Right;   PERIPHERAL VASCULAR CATHETERIZATION Right 01/25/2016   Procedure: Lower Extremity Angiography;  Surgeon: Katha Cabal, MD;  Location: [place] [facility];  Service: Cardiovascular;  Laterality: Right;   TOE  AMPUTATION     TONSILLECTOMY       Social History   Socioeconomic History   Marital status: Married    Spouse name: Not on file   Number of children: Not on file   Years of education: Not on file   Highest education level: Not on file  Occupational History   Not on file  Tobacco Use   Smoking status: Former    Packs/day: 0.50    Years: 45.00    Pack years: 22.50    Types: Cigarettes    Quit date: 04/05/2015    Years since quitting: 6.4   Smokeless tobacco: Never  Vaping Use   Vaping Use: Every day  Substance and Sexual Activity   Alcohol use: Not Currently    Alcohol/week: 3.0 standard drinks    Types: 3 Glasses of wine per week   Drug use: No   Sexual activity: Not on file  Other Topics Concern   Not on file  Social History Narrative   Not on file   Social Determinants of Health   Financial Resource Strain: Not on file  Food Insecurity: Not on file  Transportation Needs: Not on file  Physical Activity: Not on file  Stress: Not on file  Social Connections: Not on file  Intimate Partner Violence: Not on file    Family History  Problem Relation Age of Onset   Hypertension Father    Coronary artery disease Father    Alcohol abuse Father    Heart failure Brother     Current Facility-Administered Medications  Medication Dose Route Frequency Provider Last Rate Last Admin   (feeding supplement) PROSource Plus liquid 30 mL  30 mL Oral TID BM Sharen Hones, MD   30 mL at 09/29/21 2154   acetaminophen (TYLENOL) tablet 650 mg  650 mg Oral Q6H PRN Agbata, Tochukwu, MD   650 mg at 09/28/21 2300   Or   acetaminophen (TYLENOL) suppository 650 mg  650 mg Rectal Q6H PRN Agbata, Tochukwu, MD       amiodarone (PACERONE) tablet 200 mg  200 mg Oral Daily Agbata, Tochukwu, MD   200 mg at 09/29/21 0907   apixaban (ELIQUIS) tablet 5 mg  5 mg Oral BID Agbata, Tochukwu, MD   5 mg at 09/29/21 2154   ascorbic acid (VITAMIN C) tablet 500 mg  500 mg Oral Daily Agbata, Tochukwu, MD    500 mg at 09/29/21 0908   aspirin EC tablet 81 mg  81 mg Oral Daily Agbata, Tochukwu, MD   81 mg at 09/29/21 0908   chlorhexidine (PERIDEX) 0.12 % solution 15 mL  15 mL Mouth Rinse BID Agbata, Tochukwu, MD   15 mL at 09/29/21 2154   cholecalciferol (VITAMIN D3) tablet 1,000 Units  1,000 Units Oral Daily Agbata, Tochukwu, MD   1,000 Units at 09/29/21 0907   diltiazem (CARDIZEM) tablet 90 mg  90 mg Oral Q8H Sharen Hones, MD   90 mg at 09/30/21 0604   ezetimibe (ZETIA) tablet 10 mg  10 mg Oral Daily Agbata, Tochukwu, MD   10 mg  at 09/29/21 0908   feeding supplement (GLUCERNA SHAKE) (GLUCERNA SHAKE) liquid 237 mL  237 mL Oral BID BM Sharen Hones, MD       ferrous sulfate tablet 325 mg  325 mg Oral Q breakfast Agbata, Tochukwu, MD   325 mg at 09/29/21 0908   gabapentin (NEURONTIN) capsule 300 mg  300 mg Oral QHS Agbata, Tochukwu, MD   300 mg at 09/29/21 2153   insulin aspart (novoLOG) injection 0-20 Units  0-20 Units Subcutaneous TID WC Agbata, Tochukwu, MD   7 Units at 09/29/21 1702   insulin aspart (novoLOG) injection 3 Units  3 Units Subcutaneous TID WC Sharen Hones, MD   3 Units at 09/29/21 1702   insulin glargine-yfgn (SEMGLEE) injection 25 Units  25 Units Subcutaneous BID Agbata, Tochukwu, MD   25 Units at 09/29/21 2154   MEDLINE mouth rinse  15 mL Mouth Rinse q12n4p Agbata, Tochukwu, MD   15 mL at 09/29/21 1550   metoprolol tartrate (LOPRESSOR) tablet 100 mg  100 mg Oral BID Agbata, Tochukwu, MD   100 mg at 09/29/21 2153   metroNIDAZOLE (FLAGYL) IVPB 500 mg  500 mg Intravenous Q12H Agbata, Tochukwu, MD 100 mL/hr at 09/30/21 0447 500 mg at 09/30/21 0447   multivitamin with minerals tablet 1 tablet  1 tablet Oral Daily Agbata, Tochukwu, MD   1 tablet at 09/29/21 0908   nitroGLYCERIN (NITROSTAT) SL tablet 0.4 mg  0.4 mg Sublingual Q5 min PRN Agbata, Tochukwu, MD       nystatin (MYCOSTATIN/NYSTOP) topical powder   Topical BID Sharion Settler, NP   Given at 09/29/21 2155   ondansetron (ZOFRAN) tablet  4 mg  4 mg Oral Q6H PRN Agbata, Tochukwu, MD       Or   ondansetron (ZOFRAN) injection 4 mg  4 mg Intravenous Q6H PRN Agbata, Tochukwu, MD       pramipexole (MIRAPEX) tablet 2 mg  2 mg Oral QHS Agbata, Tochukwu, MD   2 mg at 09/29/21 2156   primidone (MYSOLINE) tablet 250 mg  250 mg Oral BID Agbata, Tochukwu, MD   250 mg at 09/29/21 2154   tamsulosin (FLOMAX) capsule 0.4 mg  0.4 mg Oral Daily Agbata, Tochukwu, MD   0.4 mg at 09/29/21 3299   traMADol (ULTRAM) tablet 50 mg  50 mg Oral Daily PRN Agbata, Tochukwu, MD       vancomycin (VANCOREADY) IVPB 1500 mg/300 mL  1,500 mg Intravenous Q12H Belue, Alver Sorrow, RPH       vitamin B-12 (CYANOCOBALAMIN) tablet 1,000 mcg  1,000 mcg Oral Daily Agbata, Tochukwu, MD   1,000 mcg at 09/29/21 2426   zinc sulfate capsule 220 mg  220 mg Oral Daily Agbata, Tochukwu, MD   220 mg at 09/29/21 0908    Allergies  Allergen Reactions   Penicillins Other (See Comments)    Happened at 67 years old and pt. stated he passed out  He has tolerated amoxicillin/clavulanate and ampicillin/sulbactam   Statins     Other reaction(s): Muscle Pain Causes legs to ache per pt    REVIEW OF SYSTEMS (negative unless checked):   Cardiac:  '[]'$  Chest pain or chest pressure? '[]'$  Shortness of breath upon activity? '[]'$  Shortness of breath when lying flat? '[]'$  Irregular heart rhythm?  Vascular:  '[]'$  Pain in calf, thigh, or hip brought on by walking? '[]'$  Pain in feet at night that wakes you up from your sleep? '[]'$  Blood clot in your veins? '[]'$  Leg swelling?  Pulmonary:  '[]'$  Oxygen  at home? '[]'$  Productive cough? '[]'$  Wheezing?  Neurologic:  '[]'$  Sudden weakness in arms or legs? '[]'$  Sudden numbness in arms or legs? '[]'$  Sudden onset of difficult speaking or slurred speech? '[]'$  Temporary loss of vision in one eye? '[]'$  Problems with dizziness?  Gastrointestinal:  '[]'$  Blood in stool? '[]'$  Vomited blood?  Genitourinary:  '[]'$  Burning when urinating? '[]'$  Blood in urine?  Psychiatric:  '[]'$  Major  depression  Hematologic:  '[]'$  Bleeding problems? '[]'$  Problems with blood clotting?  Dermatologic:  '[]'$  Rashes or ulcers?  Constitutional:  '[]'$  Fever or chills?  Ear/Nose/Throat:  '[]'$  Change in hearing? '[]'$  Nose bleeds? '[]'$  Sore throat?  Musculoskeletal:  '[]'$  Back pain? '[]'$  Joint pain? '[]'$  Muscle pain?   Physical Examination     Vitals:   09/30/21 0116 09/30/21 0117 09/30/21 0444 09/30/21 0703  BP:  (!) 124/93 126/88   Pulse:  (!) 112 (!) 112   Resp:  (!) 22 (!) 22 19  Temp:  98.5 F (36.9 C) 97.7 F (36.5 C)   TempSrc:      SpO2:  100% 94%   Weight: (!) 181.1 kg     Height:       Body mass index is 48.6 kg/m.  General Alert, O x 3, Obese, Ill appearing  Pulmonary Sym exp, good B air movt, CTA B  Cardiac RRR, Nl S1, S2, no Murmurs, No rubs, No S3,S4  Vascular Vessel Right Left  Radial Palpable Palpable  Brachial Palpable Palpable  Carotid Palpable, No Bruit Palpable, No Bruit  Aorta Not palpable [name]  Femoral Not palpable due to pannus Not palpable due to pannus  Popliteal Not palpable Not palpable  PT Not palpable Not palpable  DP Not palpable Not palpable    Musculo- skeletal M/S 5/5 throughout , L heel ulcer down to calcaneus, blanching erythema from foot to knee level    Non-invasive Vascular Imaging      ABI (07/18/21)    ABI Findings:  +---------+------------------+-----+--------+--------+  Right    Rt Pressure (mmHg)IndexWaveformComment   +---------+------------------+-----+--------+--------+  Brachial 148                                      +---------+------------------+-----+--------+--------+  ATA      126               0.84 biphasic          +---------+------------------+-----+--------+--------+  PTA      132               0.88 biphasic          +---------+------------------+-----+--------+--------+  Great Toe145               0.97         4th Toe   +---------+------------------+-----+--------+--------+    +---------+------------------+-----+--------+-------+  Left     Lt Pressure (mmHg)IndexWaveformComment  +---------+------------------+-----+--------+-------+  Brachial 150                                     +---------+------------------+-----+--------+-------+  ATA      143               0.95 biphasic         +---------+------------------+-----+--------+-------+  PTA      158  1.05 biphasic         +---------+------------------+-----+--------+-------+  Great Toe155               1.03                  +---------+------------------+-----+--------+-------+   +-------+-----------+-----------+------------+------------+  ABI/TBIToday's ABIToday's TBIPrevious ABIPrevious TBI  +-------+-----------+-----------+------------+------------+  Right  .88        .97        .87         [name]           +-------+-----------+-----------+------------+------------+  Left   1.05       1.03       .88         .65           +-------+-----------+-----------+------------+------------+        Laboratory   CBC    Latest Ref Rng & Units 09/29/2021    6:10 AM 09/28/2021    2:04 PM 08/08/2021    4:39 PM  CBC  WBC 4.0 - 10.5 K/uL 6.8   9.3   7.4    Hemoglobin 13.0 - 17.0 g/dL 11.5   11.6   10.2    Hematocrit 39.0 - 52.0 % 39.2   38.6   34.6    Platelets 150 - 400 K/uL 252   274   209      BMP    Latest Ref Rng & Units 09/30/2021    5:14 AM 09/29/2021    6:10 AM 09/28/2021    2:04 PM  BMP  Glucose 70 - 99 mg/dL 214   172   421    BUN 8 - 23 mg/dL '26   22   24    '$ Creatinine 0.61 - 1.24 mg/dL 1.34   0.99   1.01    Sodium 135 - 145 mmol/L 138   142   138    Potassium 3.5 - 5.1 mmol/L 5.1   4.1   4.5    Chloride 98 - 111 mmol/L 98   103   100    CO2 22 - 32 mmol/L 33   33   32    Calcium 8.9 - 10.3 mg/dL 8.7   8.3   8.5      Coagulation Lab Results  Component Value Date   INR 1.3 (H) 09/28/2021   INR 1.2 05/08/2021   INR 1.2 01/05/2021   No  results found for: PTT  Lipids No results found for: CHOL, TRIG, HDL, CHOLHDL, VLDL, LDLCALC, LDLDIRECT   Radiology     MR FOOT LEFT WO CONTRAST  Result Date: 09/29/2021 CLINICAL DATA:  Osteomyelitis of the foot. EXAM: MRI OF THE LEFT FOOT WITHOUT CONTRAST TECHNIQUE: Multiplanar, multisequence MR imaging of the left forefoot was performed. No intravenous contrast was administered. COMPARISON:  Radiograph 09/28/2021 FINDINGS: Despite efforts by the technologist and patient, motion artifact is present on today's exam and could not be eliminated. This reduces exam sensitivity and specificity. Bones/Joint/Cartilage Substantially worsened osteomyelitis of the calcaneus with abnormal edema throughout the posterior calcaneus and extending to the calcaneal neck. Exposed bony surface of the posteroinferior calcaneus due to the large ulceration shown on image 21 series 13. Degenerative lesions along the calcaneal side of the posterior subtalar joint. Mild degenerative loss of articular cartilage thickness at the tibiotalar joint. Mild degenerative spurring in the midfoot. Ligaments Grossly intact, motion artifact precludes detailed assessment. Muscles and Tendons Substantial distal Achilles tendinopathy with fusiform expansion of the Achilles tendon  minimally worsened from prior. Defect and possible rupture of the proximal attachment of the plantar fascia especially the medial band. Common peroneus tendon sheath tenosynovitis. Mild distal tibialis posterior tenosynovitis. Soft tissues Dominant finding is the deep ulceration along the plantar heel extending to the bony margin of the calcaneus on image 22 series 13. This large soft tissue defect is accompanied by substantial subcutaneous edema along the heel and tracking to a lesser extent along the ankle diffusely. IMPRESSION: 1. Substantially increased osteomyelitis of the calcaneus. At least partial rupture of the plantar fascia. A large plantar heel ulcer extends  to the bony margin of the calcaneus. 2. Continued substantial distal Achilles tendinopathy. 3. Mild degenerative findings at the posterior subtalar joint. Mild degenerative spurring in the midfoot. 4. Diffuse cellulitis especially along the heel. Electronically Signed   By: Van Clines M.D.   On: 09/29/2021 07:38   DG Chest Port 1 View  Result Date: 09/28/2021 CLINICAL DATA:  Lower extremity swelling EXAM: PORTABLE CHEST 1 VIEW COMPARISON:  08/08/2021 FINDINGS: Stable cardiomegaly status post sternotomy and CABG. Pulmonary vascular congestion with bilateral perihilar and bibasilar interstitial opacities. No large pleural fluid collection. Please note the right costophrenic angle was excluded from the field of view. No pneumothorax. IMPRESSION: Findings suggestive of CHF with pulmonary edema. Electronically Signed   By: Davina Poke D.O.   On: 09/28/2021 14:19   DG Foot Complete Left  Result Date: 09/28/2021 CLINICAL DATA:  History of foot wound and osteo. EXAM: LEFT FOOT - COMPLETE 3+ VIEW COMPARISON:  August 07, 2021. FINDINGS: Soft tissue ulceration in the plantar heel with gas and swelling extending to the osseous surface and cortical irregularity along the inferior aspect of the calcaneus. Prior transmetatarsal amputation of the third digit and distal fifth metatarsal. Soft tissue swelling about the foot. IMPRESSION: 1. Soft tissue ulceration in the plantar heel with gas and swelling extending to the osseous surface and cortical irregularity along the inferior aspect of the calcaneus, concerning for osteomyelitis. 2. Prior transmetatarsal amputation of the third digit and distal fifth metatarsal. Electronically Signed   By: Dahlia Bailiff M.D.   On: 09/28/2021 18:20      Medical Decision Making   Jaquel A Beaumont Brooke Bonito. is a 66 y.o. male who presents with: L non-salvageable foot with ascending cellulitis  Given significant ascending cellulitis, I have some concerns for possible abscess  development with L BKA I suggested consideration of guillotine L BKA Pt is needs to time to consider proceed.  Reportedly his wife will be by later today. Will resume diet and keep him NPO after MN in case he elects to proceed. Dr. Lucky Cowboy felt the patient would do better if he had the amputation done this weekend. Thank you for allowing [place] to participate in this patient's care.   Adele Barthel, MD, FACS, FSVS Covering for [facility]: [phone]  09/30/2021, 8:17 AM  Addendum  After further review of the chart, pt is on Eliquis.  This will need to be reversed for 48 hours before any further intervention possible.  Adele Barthel, MD, FACS, FSVS Covering for [facility]: [phone]  09/30/2021, 10:29 AM

## 2021-09-30 NOTE — Progress Notes (Signed)
PT placed on CPAP for HS, with 3L O2 bleed. Stable at this time.

## 2021-09-30 NOTE — Progress Notes (Signed)
  Progress Note   Patient: [name]. MGN:[id number] DOB: 1955/12/26 DOA: 09/28/2021     2 DOS: the patient was seen and examined on 09/30/2021   Brief hospital course: Amritpal A Sheahan Brooke Bonito. is a 66 y.o. male with medical history significant for chronic diastolic dysfunction CHF with last known LVEF of 50 to 55%, hypertension, coronary artery disease, depression, COPD, A-fib, morbid obesity (BMI 49), obstructive sleep apnea, status post recent wound debridement and partial calcanectomy (05/16) currently on oral antibiotic therapy after completion of IV who presents to the ER for evaluation of bilateral lower extremity swelling as well as increased redness and swelling involving the left lower extremity mostly over the heel. MRI of the foot showed left foot osteomyelitis, scheduled for foot amputation.  Also placed on vancomycin and Zosyn. Patient also has evidence of acute on chronic diastolic congestive heart failure.  Started IV Lasix.  Assessment and Plan: Left foot ulcer. Left foot osteomyelitis. Sepsis ruled out. MRI of the foot showed a severe osteomyelitis.  Currently pending left foot amputation.  Blood culture 1/4 positive for staph species, likely contaminants. We will continue antibiotics with Rocephin, Flagyl and vancomycin for now.  Acute metabolic encephalopathy. Patient was confused yesterday but later improved.  Continue to follow.  Acute on chronic diastolic congestive heart failure. Acute hypoxemic respiratory failure. POA, ruled in.  Acute kidney injury secondary to diuretics. Volume status much better today, will hold Lasix for today.  Recheck BMP tomorrow.  Uncontrolled type 2 diabetes with hyperglycemia. Continue sliding scale insulin, add long-acting insulin.   Moderate protein calorie malnutrition. Continue protein supplement.  Morbid obesity. BMI 48.69 OSA Patient was on CPAP last night.  We will continue CPAP while asleep.  Paroxysmal atrial  fibrillation with rapid ventricle response Continue Eliquis, continue beta-blocker as well as calcium channel blocker.  Followed by cardiology      Subjective:  Patient is sleepy this morning, has some confusion. Still on 4 L oxygen.  Short of breath with exertion.  Physical Exam: Vitals:   09/30/21 0444 09/30/21 0703 09/30/21 0817 09/30/21 1145  BP: 126/88  118/84 124/83  Pulse: (!) 112  (!) 114 (!) 107  Resp: (!) '22 19 19 19  '$ Temp: 97.7 F (36.5 C)  97.8 F (36.6 C)   TempSrc:      SpO2: 94%  95% 95%  Weight:      Height:       General exam: Appears calm and comfortable  Respiratory system: Decreased breathing sounds. Respiratory effort normal. Cardiovascular system: Appear regular with tachycardia. No JVD, murmurs, rubs, gallops or clicks. No pedal edema. Gastrointestinal system: Abdomen is nondistended, soft and nontender. No organomegaly or masses felt. Normal bowel sounds heard. Central nervous system: Alert and oriented x2. No focal neurological deficits. Extremities: Symmetric 5 x 5 power. Skin: No rashes, lesions or ulcers Psychiatry: Judgement and insight appear normal. Mood & affect appropriate.   Data Reviewed:  Lab results reviewed.  Family Communication:   Disposition: Status is: Inpatient Remains inpatient appropriate because: Severity of disease, pending inpatient procedure, IV treatment.  Planned Discharge Destination: Skilled nursing facility    Time spent: 35 minutes  Author: Sharen Hones, MD 09/30/2021 12:33 PM  For on call review [email or web address].

## 2021-10-01 DIAGNOSIS — M86672 Other chronic osteomyelitis, left ankle and foot: Secondary | ICD-10-CM | POA: Diagnosis not present

## 2021-10-01 DIAGNOSIS — L089 Local infection of the skin and subcutaneous tissue, unspecified: Secondary | ICD-10-CM | POA: Diagnosis not present

## 2021-10-01 DIAGNOSIS — I5033 Acute on chronic diastolic (congestive) heart failure: Secondary | ICD-10-CM | POA: Diagnosis not present

## 2021-10-01 DIAGNOSIS — G9341 Metabolic encephalopathy: Secondary | ICD-10-CM | POA: Diagnosis not present

## 2021-10-01 DIAGNOSIS — I890 Lymphedema, not elsewhere classified: Secondary | ICD-10-CM

## 2021-10-01 DIAGNOSIS — I480 Paroxysmal atrial fibrillation: Secondary | ICD-10-CM | POA: Diagnosis not present

## 2021-10-01 LAB — BLOOD CULTURE ID PANEL (REFLEXED) - BCID2

## 2021-10-01 LAB — BASIC METABOLIC PANEL
Anion gap: 6 (ref 5–15)
BUN: 31 mg/dL — ABNORMAL HIGH (ref 8–23)
CO2: 30 mmol/L (ref 22–32)
Calcium: 8.4 mg/dL — ABNORMAL LOW (ref 8.9–10.3)
Chloride: 100 mmol/L (ref 98–111)
Creatinine, Ser: 1.03 mg/dL (ref 0.61–1.24)
GFR, Estimated: >60 mL/min (ref >60)
Glucose, Bld: 232 mg/dL — ABNORMAL HIGH (ref 70–99)
Potassium: 4.4 mmol/L (ref 3.5–5.1)
Sodium: 136 mmol/L (ref 135–145)

## 2021-10-01 LAB — CBC
HCT: 39.5 % (ref 39.0–52.0)
Hemoglobin: 11.5 g/dL — ABNORMAL LOW (ref 13.0–17.0)
MCH: 26.7 pg (ref 26.0–34.0)
MCHC: 29.1 g/dL — ABNORMAL LOW (ref 30.0–36.0)
MCV: 91.9 fL (ref 80.0–100.0)
Platelets: 267 10*3/uL (ref 150–400)
RBC: 4.3 MIL/uL (ref 4.22–5.81)
RDW: 16.6 % — ABNORMAL HIGH (ref 11.5–15.5)
WBC: 5.7 10*3/uL (ref 4.0–10.5)
nRBC: 0 % (ref 0.0–0.2)

## 2021-10-01 LAB — APTT
aPTT: 34 seconds (ref 24–36)
aPTT: 66 seconds — ABNORMAL HIGH (ref 24–36)

## 2021-10-01 LAB — GLUCOSE, CAPILLARY
Glucose-Capillary: 193 mg/dL — ABNORMAL HIGH (ref 70–99)
Glucose-Capillary: 261 mg/dL — ABNORMAL HIGH (ref 70–99)
Glucose-Capillary: 277 mg/dL — ABNORMAL HIGH (ref 70–99)
Glucose-Capillary: 281 mg/dL — ABNORMAL HIGH (ref 70–99)

## 2021-10-01 LAB — MAGNESIUM: Magnesium: 2 mg/dL (ref 1.7–2.4)

## 2021-10-01 MED ORDER — HEPARIN (PORCINE) 25000 UT/250ML-% IV SOLN
2100.0000 [IU]/h | INTRAVENOUS | Status: DC
Start: 2021-10-01 — End: 2021-10-01

## 2021-10-01 MED ORDER — QUETIAPINE FUMARATE 25 MG PO TABS
25.0000 mg | ORAL_TABLET | Freq: Every day | ORAL | Status: DC
  Administered 2021-10-01 – 2021-10-05 (×4): 25 mg via ORAL
  Filled 2021-10-01 (×4): qty 1

## 2021-10-01 MED ORDER — HEPARIN (PORCINE) 25000 UT/250ML-% IV SOLN
2300.0000 [IU]/h | INTRAVENOUS | Status: DC
  Administered 2021-10-01 – 2021-10-02 (×2): 2100 [IU]/h via INTRAVENOUS
  Administered 2021-10-02: 2250 [IU]/h via INTRAVENOUS
  Filled 2021-10-01 (×3): qty 250

## 2021-10-01 MED ORDER — INSULIN ASPART 100 UNIT/ML IJ SOLN
5.0000 [IU] | Freq: Three times a day (TID) | INTRAMUSCULAR | Status: DC
  Administered 2021-10-01 – 2021-10-05 (×8): 5 [IU] via SUBCUTANEOUS
  Filled 2021-10-01 (×7): qty 1

## 2021-10-01 MED ORDER — HEPARIN BOLUS VIA INFUSION
4000.0000 [IU] | Freq: Once | INTRAVENOUS | Status: AC
  Administered 2021-10-01: 4000 [IU] via INTRAVENOUS
  Filled 2021-10-01: qty 4000

## 2021-10-01 MED ORDER — TORSEMIDE 20 MG PO TABS
40.0000 mg | ORAL_TABLET | Freq: Every day | ORAL | Status: DC
  Administered 2021-10-01: 40 mg via ORAL
  Filled 2021-10-01: qty 2

## 2021-10-01 MED ORDER — HEPARIN BOLUS VIA INFUSION: 4000.0000 [IU] | Freq: Once | INTRAVENOUS | Status: DC

## 2021-10-01 NOTE — Consult Note (Signed)
NAME: [name].  DOB: Jul 05, 1955  MRN: [id number]  Date/Time: 10/01/2021 3:36 PM  REQUESTING PROVIDER: Dr.Zhang Subjective:  REASON FOR CONSULT: MRSA bacteremia ? [name]. is a 66 y.o. with a history of DM, HTN, HLD, OSA, CAD, CABG, CHF, venous edema /lymphedema legs, s/p amputation of multiple toes both feet, acute osteo of left calcaneum with wound for which he underwent partial calcanectomy April 2023 and culture positive for Kleb, proteus, bacteroides and he received 6 weeks of IV zosyn which he competed on 09/17/21 , and was put on augmentin following that  admitted on 09/28/21 with b/l lower extremity swelling and redness of the left foot.Vitals in the ED  BP 127/72, temp 98.5, HR 117, wt 402 pounds WBC 9.3 HB 11.6, PLT 274 Bloodc ulture sent and started on vanco/ceftriaxone and flagyl Seen by Dr.Cline who recommended BKA . I am seeing the patient for MRSA Pt and wife at bed side Pt apparently was confused last night and though he wa sin [employer]  Past Medical History:  Diagnosis Date   Arrhythmia    atrial fibrillation   Asthma    CHF (congestive heart failure) (HCC)    COPD (chronic obstructive pulmonary disease) ([place])    Coronary artery disease    Depression    Diabetes mellitus without complication ([place])    Gout    History anabolic steroid use    Hyperlipidemia    Hypertension    Hypogonadism in male    Morbid obesity ([place])    Myocardial infarction ([place])    Peripheral vascular disease ([place])    Perirectal abscess    Pleurisy    Sleep apnea    CPAP at night, no oxygen   Varicella     Past Surgical History:  Procedure Laterality Date   ABDOMINAL AORTIC ANEURYSM REPAIR     ACHILLES TENDON SURGERY Left 01/10/2021   Procedure: ACHILLES LENGTHENING/KIDNER;  Surgeon: Caroline More, DPM;  Location: [facility];  Service: Podiatry;  Laterality: Left;   AMPUTATION TOE Right 02/10/2016   Procedure: AMPUTATION TOE 3RD TOE;  Surgeon: Samara Deist, DPM;   Location: [facility];  Service: Podiatry;  Laterality: Right;   AMPUTATION TOE Left 02/24/2020   Procedure: AMPUTATION TOE;  Surgeon: Caroline More, DPM;  Location: [facility];  Service: Podiatry;  Laterality: Left;   APPLICATION OF WOUND VAC Left 02/29/2020   Procedure: APPLICATION OF WOUND VAC;  Surgeon: Caroline More, DPM;  Location: [facility];  Service: Podiatry;  Laterality: Left;   COLONOSCOPY WITH PROPOFOL [name] 11/18/2015   Procedure: COLONOSCOPY WITH PROPOFOL;  Surgeon: Manya Silvas, MD;  Location: [facility];  Service: Endoscopy;  Laterality: [name];   CORONARY ARTERY BYPASS GRAFT     CORONARY STENT INTERVENTION [name] 02/02/2020   Procedure: CORONARY STENT INTERVENTION;  Surgeon: Isaias Cowman, MD;  Location: [place] [facility];  Service: Cardiovascular;  Laterality: [name];   INCISION AND DRAINAGE Left 08/07/2021   Procedure: INCISION AND DRAINAGE-Partial Calcanectomy;  Surgeon: Caroline More, DPM;  Location: [facility];  Service: Podiatry;  Laterality: Left;   IRRIGATION AND DEBRIDEMENT FOOT Left 02/29/2020   Procedure: IRRIGATION AND DEBRIDEMENT FOOT;  Surgeon: Caroline More, DPM;  Location: [facility];  Service: Podiatry;  Laterality: Left;   IRRIGATION AND DEBRIDEMENT FOOT Left 02/24/2020   Procedure: IRRIGATION AND DEBRIDEMENT FOOT;  Surgeon: Caroline More, DPM;  Location: [facility];  Service: Podiatry;  Laterality: Left;   KNEE ARTHROSCOPY     LEFT HEART CATH AND CORS/GRAFTS ANGIOGRAPHY  [name] 02/02/2020   Procedure: LEFT HEART CATH AND CORS/GRAFTS ANGIOGRAPHY;  Surgeon: Teodoro Spray, MD;  Location: [place] [facility];  Service: Cardiovascular;  Laterality: [name];   LOWER EXTREMITY ANGIOGRAPHY Left 02/25/2020   Procedure: Lower Extremity Angiography;  Surgeon: Algernon Huxley, MD;  Location: [place] [facility];  Service: Cardiovascular;  Laterality: Left;   LOWER EXTREMITY ANGIOGRAPHY Left 01/04/2021   Procedure: LOWER EXTREMITY ANGIOGRAPHY;  Surgeon: Algernon Huxley, MD;  Location: [place] [facility];  Service: Cardiovascular;  Laterality: Left;   METATARSAL HEAD EXCISION Left 01/10/2021   Procedure: METATARSAL HEAD EXCISION - LEFT 5th;  Surgeon: Caroline More, DPM;  Location: [facility];  Service: Podiatry;  Laterality: Left;   PERIPHERAL VASCULAR CATHETERIZATION Right 01/24/2016   Procedure: Lower Extremity Angiography;  Surgeon: Katha Cabal, MD;  Location: [place] [facility];  Service: Cardiovascular;  Laterality: Right;   PERIPHERAL VASCULAR CATHETERIZATION Right 01/25/2016   Procedure: Lower Extremity Angiography;  Surgeon: Katha Cabal, MD;  Location: [place] [facility];  Service: Cardiovascular;  Laterality: Right;   TOE AMPUTATION     TONSILLECTOMY      Social History   Socioeconomic History   Marital status: Married    Spouse name: Not on file   Number of children: Not on file   Years of education: Not on file   Highest education level: Not on file  Occupational History   Not on file  Tobacco Use   Smoking status: Former    Packs/day: 0.50    Years: 45.00    Pack years: 22.50    Types: Cigarettes    Quit date: 04/05/2015    Years since quitting: 6.4   Smokeless tobacco: Never  Vaping Use   Vaping Use: Every day  Substance and Sexual Activity   Alcohol use: Not Currently    Alcohol/week: 3.0 standard drinks    Types: 3 Glasses of wine per week   Drug use: No   Sexual activity: Not on file  Other Topics Concern   Not on file  Social History Narrative   Not on file   Social Determinants of Health   Financial Resource Strain: Not on file  Food Insecurity: Not on file  Transportation Needs: Not on file  Physical Activity: Not on file  Stress: Not on file  Social Connections: Not on file  Intimate Partner Violence: Not on file    Family History  Problem Relation Age of Onset   Hypertension Father    Coronary artery disease Father    Alcohol abuse Father    Heart failure Brother    Allergies  Allergen Reactions   Penicillins  Other (See Comments)    Happened at 66 years old and pt. stated he passed out  He has tolerated amoxicillin/clavulanate and ampicillin/sulbactam   Statins     Other reaction(s): Muscle Pain Causes legs to ache per pt   I? Current Facility-Administered Medications  Medication Dose Route Frequency Provider Last Rate Last Admin   (feeding supplement) PROSource Plus liquid 30 mL  30 mL Oral TID BM Sharen Hones, MD   30 mL at 10/01/21 1317   acetaminophen (TYLENOL) tablet 650 mg  650 mg Oral Q6H PRN Agbata, Tochukwu, MD   650 mg at 09/28/21 2300   Or   acetaminophen (TYLENOL) suppository 650 mg  650 mg Rectal Q6H PRN Agbata, Tochukwu, MD       amiodarone (PACERONE) tablet 200 mg  200 mg Oral Daily Agbata,  Tochukwu, MD   200 mg at 10/01/21 0841   ascorbic acid (VITAMIN C) tablet 500 mg  500 mg Oral Daily Agbata, Tochukwu, MD   500 mg at 10/01/21 9211   aspirin EC tablet 81 mg  81 mg Oral Daily Agbata, Tochukwu, MD   81 mg at 10/01/21 0841   cefTRIAXone (ROCEPHIN) 2 g in sodium chloride 0.9 % 100 mL IVPB  2 g Intravenous Daily Sharen Hones, MD 200 mL/hr at 09/30/21 1601 2 g at 09/30/21 1601   chlorhexidine (PERIDEX) 0.12 % solution 15 mL  15 mL Mouth Rinse BID Agbata, Tochukwu, MD   15 mL at 09/30/21 2159   cholecalciferol (VITAMIN D3) tablet 1,000 Units  1,000 Units Oral Daily Agbata, Tochukwu, MD   1,000 Units at 10/01/21 0842   diltiazem (CARDIZEM) tablet 90 mg  90 mg Oral Q8H Sharen Hones, MD   90 mg at 10/01/21 1317   ezetimibe (ZETIA) tablet 10 mg  10 mg Oral Daily Agbata, Tochukwu, MD   10 mg at 10/01/21 0842   feeding supplement (GLUCERNA SHAKE) (GLUCERNA SHAKE) liquid 237 mL  237 mL Oral BID BM Sharen Hones, MD   237 mL at 10/01/21 1318   ferrous sulfate tablet 325 mg  325 mg Oral Q breakfast Agbata, Tochukwu, MD   325 mg at 10/01/21 0844   gabapentin (NEURONTIN) capsule 300 mg  300 mg Oral QHS Agbata, Tochukwu, MD   300 mg at 09/30/21 2159   heparin ADULT infusion 100 units/mL (25000  units/237m)  2,100 Units/hr Intravenous Continuous CBenita Gutter RPH 21 mL/hr at 10/01/21 1519 2,100 Units/hr at 10/01/21 1519   insulin aspart (novoLOG) injection 0-20 Units  0-20 Units Subcutaneous TID WC Agbata, Tochukwu, MD   11 Units at 10/01/21 1317   insulin aspart (novoLOG) injection 5 Units  5 Units Subcutaneous TID WC ZSharen Hones MD       insulin glargine-yfgn (SEMGLEE) injection 25 Units  25 Units Subcutaneous BID Agbata, Tochukwu, MD   25 Units at 10/01/21 0842   ipratropium-albuterol (DUONEB) 0.5-2.5 (3) MG/3ML nebulizer solution 3 mL  3 mL Nebulization Q6H ZSharen Hones MD   3 mL at 10/01/21 1434   MEDLINE mouth rinse  15 mL Mouth Rinse q12n4p Agbata, Tochukwu, MD   15 mL at 09/30/21 1727   metoprolol tartrate (LOPRESSOR) tablet 100 mg  100 mg Oral BID Agbata, Tochukwu, MD   100 mg at 10/01/21 0841   metroNIDAZOLE (FLAGYL) IVPB 500 mg  500 mg Intravenous Q12H Agbata, Tochukwu, MD 100 mL/hr at 10/01/21 1522 500 mg at 10/01/21 1522   multivitamin with minerals tablet 1 tablet  1 tablet Oral Daily Agbata, Tochukwu, MD   1 tablet at 10/01/21 0841   nitroGLYCERIN (NITROSTAT) SL tablet 0.4 mg  0.4 mg Sublingual Q5 min PRN Agbata, Tochukwu, MD       nystatin (MYCOSTATIN/NYSTOP) topical powder   Topical BID MSharion Settler NP   Given at 10/01/21 0844   ondansetron (ZOFRAN) tablet 4 mg  4 mg Oral Q6H PRN Agbata, Tochukwu, MD       Or   ondansetron (ZOFRAN) injection 4 mg  4 mg Intravenous Q6H PRN Agbata, Tochukwu, MD       pramipexole (MIRAPEX) tablet 2 mg  2 mg Oral QHS Agbata, Tochukwu, MD   2 mg at 09/30/21 2159   primidone (MYSOLINE) tablet 250 mg  250 mg Oral BID Agbata, Tochukwu, MD   250 mg at 10/01/21 0842   QUEtiapine (SEROQUEL) tablet 25  mg  25 mg Oral QHS Sharen Hones, MD       tamsulosin [facility]) capsule 0.4 mg  0.4 mg Oral Daily Agbata, Tochukwu, MD   0.4 mg at 10/01/21 0842   torsemide (DEMADEX) tablet 40 mg  40 mg Oral Daily Sharen Hones, MD   40 mg at 10/01/21 1514    traMADol (ULTRAM) tablet 50 mg  50 mg Oral Daily PRN Agbata, Tochukwu, MD       vancomycin (VANCOREADY) IVPB 1250 mg/250 mL  1,250 mg Intravenous Q12H Nazari, Walid A, RPH 166.7 mL/hr at 10/01/21 1322 1,250 mg at 10/01/21 1322   vitamin B-12 (CYANOCOBALAMIN) tablet 1,000 mcg  1,000 mcg Oral Daily Agbata, Tochukwu, MD   1,000 mcg at 10/01/21 0841   zinc sulfate capsule 220 mg  220 mg Oral Daily Agbata, Tochukwu, MD   220 mg at 10/01/21 0841     Abtx:  Anti-infectives (From admission, onward)    Start     Dose/Rate Route Frequency Ordered Stop   09/30/21 1330  cefTRIAXone (ROCEPHIN) 2 g in sodium chloride 0.9 % 100 mL IVPB        2 g 200 mL/hr over 30 Minutes Intravenous Daily 09/30/21 1233     09/30/21 1200  vancomycin (VANCOREADY) IVPB 1500 mg/300 mL  Status:  Discontinued        1,500 mg 150 mL/hr over 120 Minutes Intravenous Every 12 hours 09/30/21 0120 09/30/21 0927   09/30/21 1200  vancomycin (VANCOREADY) IVPB 1250 mg/250 mL        1,250 mg 166.7 mL/hr over 90 Minutes Intravenous Every 12 hours 09/30/21 0927     09/30/21 0000  vancomycin (VANCOREADY) IVPB 1250 mg/250 mL       See Hyperspace for full Linked Orders Report.   1,250 mg 166.7 mL/hr over 90 Minutes Intravenous  Once 09/29/21 2308 09/30/21 0538   09/30/21 0000  vancomycin (VANCOREADY) IVPB 1250 mg/250 mL       See Hyperspace for full Linked Orders Report.   1,250 mg 166.7 mL/hr over 90 Minutes Intravenous  Once 09/29/21 2308 09/30/21 0255   09/29/21 0200  cefTRIAXone (ROCEPHIN) 2 g in sodium chloride 0.9 % 100 mL IVPB  Status:  Discontinued       See Hyperspace for full Linked Orders Report.   2 g 200 mL/hr over 30 Minutes Intravenous Every 24 hours 09/28/21 2051 09/29/21 2309   09/29/21 0200  metroNIDAZOLE (FLAGYL) IVPB 500 mg       See Hyperspace for full Linked Orders Report.   500 mg 100 mL/hr over 60 Minutes Intravenous Every 12 hours 09/28/21 2051 10/05/21 1559   09/28/21 1830  vancomycin (VANCOCIN) IVPB 1000  mg/200 mL premix        1,000 mg 200 mL/hr over 60 Minutes Intravenous  Once 09/28/21 1826 09/28/21 1937   09/28/21 1745  piperacillin-tazobactam (ZOSYN) IVPB 3.375 g        3.375 g 100 mL/hr over 30 Minutes Intravenous  Once 09/28/21 1736 09/28/21 1838       REVIEW OF SYSTEMS:  Const: negative fever, negative chills, negative weight loss Eyes: negative diplopia or visual changes, negative eye pain ENT: negative coryza, negative sore throat Resp: negative cough, hemoptysis, dyspnea Cards: negative for chest pain, palpitations, lower extremity edema GU: negative for frequency, dysuria and hematuria GI: Negative for abdominal pain, diarrhea, bleeding, constipation Skin: negative for rash and pruritus Heme: negative for easy bruising and gum/nose bleeding MS: b/l pain legs Generalized weakness Poor mobility  Neurolo:confusion Psych: anxiety, depression  Endocrine:  diabetes Allergy/Immunology- as above Objective:  VITALS:  BP 113/75 (BP Location: Left Arm)   Pulse (!) 112   Temp 98.4 F (36.9 C)   Resp 18   Ht '6\' 4"'$  (1.93 m)   Wt (!) 184.5 kg   SpO2 96%   BMI 49.52 kg/m   PHYSICAL EXAM:  General: Alert, cooperative, no distress, appears stated age. severe obesity Head: Normocephalic, without obvious abnormality, atraumatic. Eyes: Conjunctivae clear, anicteric sclerae. Pupils are equal ENT Nares normal. No drainage or sinus tenderness. Lips, mucosa, and tongue normal. No Thrush Neck: Supple, symmetrical, no adenopathy, thyroid: non tender no carotid bruit and no JVD. Back: No CVA tenderness. Lungs: b/l air entry Heart: irregular Abdomen: Soft, non-tender,not distended. Bowel sounds normal. No masses Extremities: b/l venous/lymphedema Ulcer left heel- covered with slimy discharge   Rt leg- no open wounds- crusted epidermis Skin: No rashes or lesions. Or bruising Lymph: Cervical, supraclavicular normal. Neurologic: Grossly non-focal Pertinent Labs Lab  Results CBC    Component Value Date/Time   WBC 5.7 10/01/2021 0539   RBC 4.30 10/01/2021 0539   HGB 11.5 (L) 10/01/2021 0539   HGB 14.7 07/12/2011 0919   HCT 39.5 10/01/2021 0539   HCT 45.4 07/12/2011 0919   PLT 267 10/01/2021 0539   PLT 156 07/12/2011 0919   MCV 91.9 10/01/2021 0539   MCV 96 07/12/2011 0919   MCH 26.7 10/01/2021 0539   MCHC 29.1 (L) 10/01/2021 0539   RDW 16.6 (H) 10/01/2021 0539   RDW 15.2 (H) 07/12/2011 0919   LYMPHSABS 0.7 09/28/2021 1404   LYMPHSABS 1.2 07/12/2011 0919   MONOABS 0.7 09/28/2021 1404   MONOABS 0.8 (H) 07/12/2011 0919   EOSABS 0.1 09/28/2021 1404   EOSABS 0.4 07/12/2011 0919   BASOSABS 0.1 09/28/2021 1404   BASOSABS 0.1 07/12/2011 0919       Latest Ref Rng & Units 10/01/2021    5:39 AM 09/30/2021    5:14 AM 09/29/2021    6:10 AM  CMP  Glucose 70 - 99 mg/dL 232   214   172    BUN 8 - 23 mg/dL '31   26   22    '$ Creatinine 0.61 - 1.24 mg/dL 1.03   1.34   0.99    Sodium 135 - 145 mmol/L 136   138   142    Potassium 3.5 - 5.1 mmol/L 4.4   5.1   4.1    Chloride 98 - 111 mmol/L 100   98   103    CO2 22 - 32 mmol/L 30   33   33    Calcium 8.9 - 10.3 mg/dL 8.4   8.7   8.3        Microbiology: Recent Results (from the past 240 hour(s))  Culture, blood (Routine x 2)     Status: None (Preliminary result)   Collection Time: 09/28/21  1:30 PM   Specimen: BLOOD  Result Value Ref Range Status   Specimen Description BLOOD BLOOD LEFT FOREARM  Final   Special Requests   Final    BOTTLES DRAWN AEROBIC AND ANAEROBIC Blood Culture results may not be optimal due to an excessive volume of blood received in culture bottles   Culture   Final    NO GROWTH 3 DAYS Performed at [facility], [address], [place],  [address]    Report Status PENDING  Incomplete  Culture, blood (Routine x 2)  Status: Abnormal (Preliminary result)   Collection Time: 09/28/21  2:00 PM   Specimen: BLOOD  Result Value Ref Range Status   Specimen  Description   Final    BLOOD LEFT ANTECUBITAL Performed at [facility], [address] [place], [place] [address]    Special Requests   Final    BOTTLES DRAWN AEROBIC AND ANAEROBIC Blood Culture results may not be optimal due to an excessive volume of blood received in culture bottles Performed at [facility], [address] [place],  [address]    Culture  Setup Time   Final    GRAM POSITIVE COCCI AEROBIC BOTTLE ONLY CRITICAL RESULT CALLED TO, READ BACK BY AND VERIFIED WITH: MORGAN HICKS '@2227'$  09/29/21 LFD GRAM STAIN REVIEWED-AGREE WITH RESULT    Culture (A)  Final    STAPHYLOCOCCUS AUREUS SUSCEPTIBILITIES TO FOLLOW CRITICAL RESULT CALLED TO, READ BACK BY AND VERIFIED WITH: PHARMD ALEC C [id number] AT 1016 BY CM Performed at [facility], [place] [employer] [address] [place],  [address]    Report Status PENDING  Incomplete  Blood Culture ID Panel (Reflexed)     Status: Abnormal   Collection Time: 09/28/21  2:00 PM  Result Value Ref Range Status   Enterococcus faecalis NOT DETECTED NOT DETECTED Final   Enterococcus Faecium NOT DETECTED NOT DETECTED Final   Listeria monocytogenes NOT DETECTED NOT DETECTED Final   Staphylococcus species DETECTED (A) NOT DETECTED Final    Comment: CRITICAL RESULT CALLED TO, READ BACK BY AND VERIFIED WITH: MORGAN HICKS @ 2227 09/29/21 LFD    Staphylococcus aureus (BCID) DETECTED (A) NOT DETECTED Final    Comment: Methicillin (oxacillin)-resistant Staphylococcus aureus (MRSA). MRSA is predictably resistant to beta-lactam antibiotics (except ceftaroline). Preferred therapy is vancomycin unless clinically contraindicated. Patient requires contact precautions if  hospitalized. CRITICAL RESULT CALLED TO, READ BACK BY AND VERIFIED WITH: MORGAN HICKS ON 09/29/21 AT 2227 LFD    Staphylococcus epidermidis NOT DETECTED NOT DETECTED Final   Staphylococcus lugdunensis NOT DETECTED NOT DETECTED Final   Streptococcus species NOT DETECTED  NOT DETECTED Final   Streptococcus agalactiae NOT DETECTED NOT DETECTED Final   Streptococcus pneumoniae NOT DETECTED NOT DETECTED Final   Streptococcus pyogenes NOT DETECTED NOT DETECTED Final   A.calcoaceticus-baumannii NOT DETECTED NOT DETECTED Final   Bacteroides fragilis NOT DETECTED NOT DETECTED Final   Enterobacterales NOT DETECTED NOT DETECTED Final   Enterobacter cloacae complex NOT DETECTED NOT DETECTED Final   Escherichia coli NOT DETECTED NOT DETECTED Final   Klebsiella aerogenes NOT DETECTED NOT DETECTED Final   Klebsiella oxytoca NOT DETECTED NOT DETECTED Final   Klebsiella pneumoniae NOT DETECTED NOT DETECTED Final   Proteus species NOT DETECTED NOT DETECTED Final   Salmonella species NOT DETECTED NOT DETECTED Final   Serratia marcescens NOT DETECTED NOT DETECTED Final   Haemophilus influenzae NOT DETECTED NOT DETECTED Final   Neisseria meningitidis NOT DETECTED NOT DETECTED Final   Pseudomonas aeruginosa NOT DETECTED NOT DETECTED Final   Stenotrophomonas maltophilia NOT DETECTED NOT DETECTED Final   Candida albicans NOT DETECTED NOT DETECTED Final   Candida auris NOT DETECTED NOT DETECTED Final   Candida glabrata NOT DETECTED NOT DETECTED Final   Candida krusei NOT DETECTED NOT DETECTED Final   Candida parapsilosis NOT DETECTED NOT DETECTED Final   Candida tropicalis NOT DETECTED NOT DETECTED Final   Cryptococcus neoformans/gattii NOT DETECTED NOT DETECTED Final   Meth resistant mecA/C and MREJ DETECTED (A) NOT DETECTED Final    Comment: CRITICAL RESULT  CALLED TO, READ BACK BY AND VERIFIED WITH: MORGAN HICKS @ 2227 09/29/21 LFD Performed at [facility], [address] [place], [place] [address]     IMAGING RESULTS: I have personally revie MRI of the left foot Increased osteomyelitis of the calcaneus.   ? Impression/Recommendation ? MRSA bacteremia likely source is the left heel wound We will repeat culture Low bioburden as the blood culture came  back positive after 3 days We will get 2D echo Patient is currently on vancomycin.  May need to change to daptomycin in a day or so.  Left heel ulcer with osteomyelitis.  Status post partial calcanectomy in April 2023 and received 6 weeks of IV antibiotics for Klebsiella, Proteus and anaerobes.  Following which he has been on Augmentin. The wound is infected Seen by Dr.Cline who does not think the foot is salvageable and I agree.  Going for BKA Currently on vancomycin, ceftriaxone and Flagyl Wound culture has been sent   Severe lymphedema/venous edema of both legs  Paroxysmal A-fib and now in atrial flutter.  On amiodarone and metoprolol and Eliquis.  Coronary artery disease status post CABG.  Acute on chronic diastolic congestive heart failure management as per primary team..  AKI has resolved Patient is currently on vancomycin observe closely  ___________________________________________________ Discussed with patient, and his wife at bedside.  Note:  This document was prepared using Dragon voice recognition software and may include unintentional dictation errors.

## 2021-10-01 NOTE — Progress Notes (Signed)
Pt removed mitts and second IV. Remains confused. 2200 dose of Eliquis held per provider, Sharion Settler, NP. 1000 dose of  Eliquis will need to be held as well in anticipation of potential leg amputation today. NPO since midnight.

## 2021-10-01 NOTE — Progress Notes (Signed)
      Daily Progress Note   Assessment/Planning:   Non-salvageable L heel ulcer  Pt has elected to proceed with L leg amputation.  Pt was still undecided on Saturday. Pt was still on Eliquis Friday so L BKA vs AKA was not possible during this weekend due to need for reversal. Pt's last dose reportedly last night Not clear when pt will be scheduled Hold Eliquis.  Heparin bridge if deemed necessary by Cardiology Dr. Junie Panning will be back on Monday.   Subjective  - * No surgery found *   No events overnight, elects to proceed with amputation   Objective   Vitals:   10/01/21 0031 10/01/21 0500 10/01/21 0525 10/01/21 0814  BP: 117/77  122/89 120/85  Pulse: (!) 114  (!) 114 (!) 111  Resp: '18  18 20  '$ Temp: 98.4 F (36.9 C)  97.9 F (36.6 C) 97.7 F (36.5 C)  TempSrc: Oral  Oral Oral  SpO2: 90%  95% 98%  Weight:  (!) 184.5 kg    Height:         Intake/Output Summary (Last 24 hours) at 10/01/2021 1007 Last data filed at 10/01/2021 0624 Gross per 24 hour  Intake 1000 ml  Output 500 ml  Net 500 ml    VASC L foot bandaged    Laboratory   CBC    Latest Ref Rng & Units 10/01/2021    5:39 AM 09/29/2021    6:10 AM 09/28/2021    2:04 PM  CBC  WBC 4.0 - 10.5 K/uL 5.7   6.8   9.3    Hemoglobin 13.0 - 17.0 g/dL 11.5   11.5   11.6    Hematocrit 39.0 - 52.0 % 39.5   39.2   38.6    Platelets 150 - 400 K/uL 267   252   274      BMET    Component Value Date/Time   NA 136 10/01/2021 0539   NA 141 07/12/2011 0919   K 4.4 10/01/2021 0539   K 4.4 07/12/2011 0919   CL 100 10/01/2021 0539   CL 102 07/12/2011 0919   CO2 30 10/01/2021 0539   CO2 29 07/12/2011 0919   GLUCOSE 232 (H) 10/01/2021 0539   GLUCOSE 76 07/12/2011 0919   BUN 31 (H) 10/01/2021 0539   BUN 16 05/05/2013 1022   CREATININE 1.03 10/01/2021 0539   CREATININE 0.71 05/05/2013 1022   CALCIUM 8.4 (L) 10/01/2021 0539   CALCIUM 8.8 07/12/2011 0919   GFRNONAA >60 10/01/2021 0539   GFRNONAA >60 05/05/2013 1022    GFRAA >60 01/06/2020 1853   GFRAA >60 05/05/2013 1022     Adele Barthel, MD, FACS, FSVS Covering for [facility]: [phone]  10/01/2021, 10:07 AM

## 2021-10-01 NOTE — Progress Notes (Signed)
  Progress Note   Patient: [name]. ULA:[id number] DOB: [birth year] DOA: 09/28/2021     3 DOS: the patient was seen and examined on 10/01/2021   Brief hospital course: Alec A Rausch Brooke Bonito. is a 66 y.o. male with medical history significant for chronic diastolic dysfunction CHF with last known LVEF of 50 to 55%, hypertension, coronary artery disease, depression, COPD, A-fib, morbid obesity (BMI 49), obstructive sleep apnea, status post recent wound debridement and partial calcanectomy (05/16) currently on oral antibiotic therapy after completion of IV who presents to the ER for evaluation of bilateral lower extremity swelling as well as increased redness and swelling involving the left lower extremity mostly over the heel. MRI of the foot showed left foot osteomyelitis, scheduled for foot amputation.  Also placed on vancomycin and Zosyn. Patient also has evidence of acute on chronic diastolic congestive heart failure.  Started IV Lasix.  Assessment and Plan: Left foot ulcer. Left foot osteomyelitis. MRSA bacteremia. Sepsis ruled out. MRI of the foot showed a severe osteomyelitis.  Currently pending left foot amputation.  Blood culture 1/4 positive for MRSA, ID consult obtained.  Continue antibiotics with vancomycin, Rocephin and Flagyl.  Acute metabolic encephalopathy. Patient could not sleep last night, he was very agitated.  Spoke to patient wife, patient often has significant agitation when he is sick in the hospital.  I will add Seroquel to her regimen to improve the sleep.  Acute on chronic diastolic congestive heart failure. Acute hypoxemic respiratory failure. POA, ruled in.  Acute kidney injury secondary to diuretics. Condition has improved, renal function has improved to normal.  Restart oral torsemide.  Uncontrolled type 2 diabetes with hyperglycemia. Continue sliding scale insulin, scheduled long-acting and short-acting insulin.   Moderate protein calorie  malnutrition. Continue protein supplement.  Morbid obesity. BMI 48.69 OSA Patient could not be put on CPAP last night due to agitation.  Discussed with RN, will give Seroquel tonight, need to place on CPAP at nighttime.  Paroxysmal atrial fibrillation with rapid ventricle response Appreciate cardiology consult.  Continue beta-blocker and calcium channel blocker.          Subjective:  Patient has some confusion today, no agitation. Short of breath with exertion, still on oxygen.  Physical Exam: Vitals:   10/01/21 0500 10/01/21 0525 10/01/21 0814 10/01/21 1116  BP:  122/89 120/85 113/75  Pulse:  (!) 114 (!) 111 (!) 112  Resp:  '18 20 18  '$ Temp:  97.9 F (36.6 C) 97.7 F (36.5 C) 98.4 F (36.9 C)  TempSrc:  Oral Oral   SpO2:  95% 98% 96%  Weight: (!) 184.5 kg     Height:       General exam: Appears calm and comfortable  Respiratory system: Decreased breath sounds. Respiratory effort normal. Cardiovascular system: Tachycardic, regular. No JVD, murmurs, rubs, gallops or clicks. No pedal edema. Gastrointestinal system: Abdomen is nondistended, soft and nontender. No organomegaly or masses felt. Normal bowel sounds heard. Central nervous system: Alert and oriented x2. No focal neurological deficits. Extremities: Symmetric 5 x 5 power. Skin: No rashes, lesions or ulcers   Data Reviewed:  Lab results reviewed.  Family Communication: Wife updated at bedside.  Disposition: Status is: Inpatient Remains inpatient appropriate because: Severity of disease, IV antibiotics, pending procedure.  Planned Discharge Destination: Skilled nursing facility    Time spent: 28 minutes  Author: Sharen Hones, MD 10/01/2021 2:07 PM  For on call review [email or web address].

## 2021-10-01 NOTE — Consult Note (Signed)
ANTICOAGULATION CONSULT NOTE  Pharmacy Consult for IV Heparin Indication: atrial fibrillation  Patient Measurements: Height: '6\' 4"'$  (193 cm) Weight: (!) 184.5 kg (406 lb 12.8 oz) IBW/kg (Calculated) : 86.8 Heparin Dosing Weight: 130.7 kg  Labs: Recent Labs    09/28/21 1404 09/29/21 0610 09/30/21 0514 10/01/21 0539  HGB 11.6* 11.5*  --  11.5*  HCT 38.6* 39.2  --  39.5  PLT 274 252  --  267  LABPROT 15.8*  --   --   --   INR 1.3*  --   --   --   CREATININE 1.01 0.99 1.34* 1.03    Estimated Creatinine Clearance: 125.6 mL/min (by C-G formula based on SCr of 1.03 mg/dL).   Medical History: Past Medical History:  Diagnosis Date   Arrhythmia    atrial fibrillation   Asthma    CHF (congestive heart failure) (HCC)    COPD (chronic obstructive pulmonary disease) (HCC)    Coronary artery disease    Depression    Diabetes mellitus without complication (HCC)    Gout    History anabolic steroid use    Hyperlipidemia    Hypertension    Hypogonadism in male    Morbid obesity ([place])    Myocardial infarction ([place])    Peripheral vascular disease ([place])    Perirectal abscess    Pleurisy    Sleep apnea    CPAP at night, no oxygen   Varicella     Medications:  Apixaban 5 mg BID (last dose 6/3 at 0933)   Assessment: Patient is a 66 y/o M with medical history as above and including Afib on apixaban who is admitted with left foot ulcer / left foot OM. Vascular surgery following and plan is for L BKA vs AKA pending scheduling. Apixaban is on hold for impending procedure. Pharmacy consulted to initiate and manage heparin infusion peri-operatively for Afib.   CBC notable for stable anemia. Baseline aPTT pending. Baseline INR 1.3.  Goal of Therapy:  Heparin level 0.3-0.7 units/ml aPTT 66 - 102 seconds Monitor platelets by anticoagulation protocol: Yes   Plan:  --Heparin 4000 unit IV bolus followed by continuous infusion at 2100 units/hr --aPTT 6 hours after initiation of  infusion --Daily CBC per protocol while on IV heparin  Benita Gutter 10/01/2021,12:15 PM

## 2021-10-01 NOTE — Progress Notes (Signed)
Patient more confused, difficult to reorient and agitated. Pulled IV out of arm, tore IV tubing and phone cord in half, removed purewick, removed tele, pushed bedside items onto floor and bed. Refusing to wear CPAP. Denies pain. Mitts placed on patient's hands. Notified Sharion Settler, NP. No new orders at this time.

## 2021-10-01 NOTE — Progress Notes (Signed)
[facility] Cardiology  SUBJECTIVE: Patient laying in bed, denies chest pain or shortness of breath   Vitals:   10/01/21 0500 10/01/21 0525 10/01/21 0814 10/01/21 1116  BP:  122/89 120/85 113/75  Pulse:  (!) 114 (!) 111 (!) 112  Resp:  '18 20 18  '$ Temp:  97.9 F (36.6 C) 97.7 F (36.5 C) 98.4 F (36.9 C)  TempSrc:  Oral Oral   SpO2:  95% 98% 96%  Weight: (!) 184.5 kg     Height:         Intake/Output Summary (Last 24 hours) at 10/01/2021 1146 Last data filed at 10/01/2021 2956 Gross per 24 hour  Intake 1000 ml  Output 500 ml  Net 500 ml      PHYSICAL EXAM  General: Well developed, well nourished, in no acute distress HEENT:  Normocephalic and atramatic Neck:  No JVD.  Lungs: Clear bilaterally to auscultation and percussion. Heart: HRRR . Normal S1 and S2 without gallops or murmurs.  Abdomen: Bowel sounds are positive, abdomen soft and non-tender  Msk:  Back normal, normal gait. Normal strength and tone for age. Extremities: No clubbing, cyanosis or edema.   Neuro: Alert and oriented X 3. Psych:  Good affect, responds appropriately   LABS: Basic Metabolic Panel: Recent Labs    09/30/21 0514 10/01/21 0539  NA 138 136  K 5.1 4.4  CL 98 100  CO2 33* 30  GLUCOSE 214* 232*  BUN 26* 31*  CREATININE 1.34* 1.03  CALCIUM 8.7* 8.4*  MG 2.0 2.0   Liver Function Tests: Recent Labs    09/28/21 1404  AST 18  ALT 19  ALKPHOS 187*  BILITOT 0.9  PROT 7.1  ALBUMIN 2.9*   No results for input(s): LIPASE, AMYLASE in the last 72 hours. CBC: Recent Labs    09/28/21 1404 09/29/21 0610 10/01/21 0539  WBC 9.3 6.8 5.7  NEUTROABS 7.7  --   --   HGB 11.6* 11.5* 11.5*  HCT 38.6* 39.2 39.5  MCV 91.3 92.0 91.9  PLT 274 252 267   Cardiac Enzymes: No results for input(s): CKTOTAL, CKMB, CKMBINDEX, TROPONINI in the last 72 hours. BNP: Invalid input(s): POCBNP D-Dimer: No results for input(s): DDIMER in the last 72 hours. Hemoglobin A1C: No results for input(s): HGBA1C in  the last 72 hours. Fasting Lipid Panel: No results for input(s): CHOL, HDL, LDLCALC, TRIG, CHOLHDL, LDLDIRECT in the last 72 hours. Thyroid Function Tests: No results for input(s): TSH, T4TOTAL, T3FREE, THYROIDAB in the last 72 hours.  Invalid input(s): FREET3 Anemia Panel: No results for input(s): VITAMINB12, FOLATE, FERRITIN, TIBC, IRON, RETICCTPCT in the last 72 hours.  No results found.   Echo LVEF 50-55% 05/10/2021  TELEMETRY: Atrial fibrillation 111 bpm:  ASSESSMENT AND PLAN:  Principal Problem:   Osteomyelitis (HCC) Active Problems:   Essential hypertension, benign   RLS (restless legs syndrome)   Obesity, Class III, BMI 40-49.9 (morbid obesity) (HCC)   Acute on chronic diastolic CHF (congestive heart failure) (HCC)   Foot ulcer with fat layer exposed, left (HCC)   AF (paroxysmal atrial fibrillation) ([place])   Uncontrolled type 2 diabetes mellitus with hyperglycemia, with long-term current use of insulin (HCC)   Malnutrition of moderate degree   Generalized weakness   Acute metabolic encephalopathy    1. Acute on chronic diastolic congestive heart failure, HFpEF, LVEF 50-55% 05/10/2021, good diuresis after IV furosemide 2.  Paroxysmal atrial fibrillation/atrial flutter, currently in atrial flutter heart rate 115 bpm, on amiodarone and metoprolol for  rate control and Eliquis for stroke prevention 3.  Coronary artery disease, status post CABG 2006, PCI RCA 01/2020, currently without chest pain 4.  Left foot osteomyelitis on IV vancomycin, left BKA pending 5.  Acute kidney injury, BUN and creatinine 26 and 1.34, respectively   Recommendations   1.  Agree with current therapy 2.  Diuresis as needed 3.  Hold Eliquis at least 4 doses for surgery 4.  Continue amiodarone and metoprolol succinate for rate and rhythm control 5.  Heparin drip till surgery 6.  Left AKA/BKA pending     Isaias Cowman, MD, PhD, [facility] 10/01/2021 11:46 AM

## 2021-10-01 NOTE — Consult Note (Signed)
ANTICOAGULATION CONSULT NOTE  Pharmacy Consult for IV Heparin Indication: atrial fibrillation  Patient Measurements: Height: '6\' 4"'$  (193 cm) Weight: (!) 184.5 kg (406 lb 12.8 oz) IBW/kg (Calculated) : 86.8 Heparin Dosing Weight: 130.7 kg  Labs: Recent Labs    09/29/21 0610 09/30/21 0514 10/01/21 0539 10/01/21 1231 10/01/21 2055  HGB 11.5*  --  11.5*  --   --   HCT 39.2  --  39.5  --   --   PLT 252  --  267  --   --   APTT  --   --   --  34 66*  CREATININE 0.99 1.34* 1.03  --   --      Estimated Creatinine Clearance: 125.6 mL/min (by C-G formula based on SCr of 1.03 mg/dL).   Medical History: Past Medical History:  Diagnosis Date   Arrhythmia    atrial fibrillation   Asthma    CHF (congestive heart failure) (HCC)    COPD (chronic obstructive pulmonary disease) (HCC)    Coronary artery disease    Depression    Diabetes mellitus without complication (HCC)    Gout    History anabolic steroid use    Hyperlipidemia    Hypertension    Hypogonadism in male    Morbid obesity ([place])    Myocardial infarction ([place])    Peripheral vascular disease ([place])    Perirectal abscess    Pleurisy    Sleep apnea    CPAP at night, no oxygen   Varicella     Medications:  Apixaban 5 mg BID (last dose 6/3 at 0933)   Assessment: Patient is a 66 y/o M with medical history as above and including Afib on apixaban who is admitted with left foot ulcer / left foot OM. Vascular surgery following and plan is for L BKA vs AKA pending scheduling. Apixaban is on hold for impending procedure. Pharmacy consulted to initiate and manage heparin infusion peri-operatively for Afib.   CBC notable for stable anemia. Baseline aPTT pending. Baseline INR 1.3.  Date Time aPTT Rate/comment 6/4 2055 66 2100 un/hr, therapeutic  Goal of Therapy:  Heparin level 0.3-0.7 units/ml aPTT 66 - 102 seconds Monitor platelets by anticoagulation protocol: Yes   Plan:  aPTT 66, therapeutic Continue heparin  infusion at 2100 units/hr Check confirmatory aPTT in 6 hours  Monitor daily CBC, s/s of bleed   Darnelle Bos, PharmD 10/01/2021,9:36 PM

## 2021-10-02 DIAGNOSIS — M869 Osteomyelitis, unspecified: Secondary | ICD-10-CM

## 2021-10-02 DIAGNOSIS — B9562 Methicillin resistant Staphylococcus aureus infection as the cause of diseases classified elsewhere: Secondary | ICD-10-CM

## 2021-10-02 DIAGNOSIS — I5033 Acute on chronic diastolic (congestive) heart failure: Secondary | ICD-10-CM | POA: Diagnosis not present

## 2021-10-02 DIAGNOSIS — R7881 Bacteremia: Secondary | ICD-10-CM

## 2021-10-02 DIAGNOSIS — M86672 Other chronic osteomyelitis, left ankle and foot: Secondary | ICD-10-CM | POA: Diagnosis not present

## 2021-10-02 DIAGNOSIS — G9341 Metabolic encephalopathy: Secondary | ICD-10-CM | POA: Diagnosis not present

## 2021-10-02 LAB — GLUCOSE, CAPILLARY
Glucose-Capillary: 122 mg/dL — ABNORMAL HIGH (ref 70–99)
Glucose-Capillary: 176 mg/dL — ABNORMAL HIGH (ref 70–99)
Glucose-Capillary: 273 mg/dL — ABNORMAL HIGH (ref 70–99)
Glucose-Capillary: 186 mg/dL — ABNORMAL HIGH (ref 70–99)

## 2021-10-02 LAB — BASIC METABOLIC PANEL
Anion gap: 7 (ref 5–15)
BUN: 34 mg/dL — ABNORMAL HIGH (ref 8–23)
CO2: 30 mmol/L (ref 22–32)
Calcium: 7.9 mg/dL — ABNORMAL LOW (ref 8.9–10.3)
Chloride: 101 mmol/L (ref 98–111)
Creatinine, Ser: 1.03 mg/dL (ref 0.61–1.24)
GFR, Estimated: >60 mL/min (ref >60)
Glucose, Bld: 158 mg/dL — ABNORMAL HIGH (ref 70–99)
Potassium: 4.5 mmol/L (ref 3.5–5.1)
Sodium: 138 mmol/L (ref 135–145)

## 2021-10-02 LAB — CBC
HCT: 37.1 % — ABNORMAL LOW (ref 39.0–52.0)
Hemoglobin: 10.9 g/dL — ABNORMAL LOW (ref 13.0–17.0)
MCH: 27 pg (ref 26.0–34.0)
MCHC: 29.4 g/dL — ABNORMAL LOW (ref 30.0–36.0)
MCV: 92.1 fL (ref 80.0–100.0)
Platelets: 237 10*3/uL (ref 150–400)
RBC: 4.03 MIL/uL — ABNORMAL LOW (ref 4.22–5.81)
RDW: 16.2 % — ABNORMAL HIGH (ref 11.5–15.5)
WBC: 5 10*3/uL (ref 4.0–10.5)
nRBC: 0 % (ref 0.0–0.2)

## 2021-10-02 LAB — HEPARIN LEVEL (UNFRACTIONATED)
Heparin Unfractionated: 0.31 IU/mL (ref 0.30–0.70)
Heparin Unfractionated: 0.43 IU/mL (ref 0.30–0.70)

## 2021-10-02 LAB — BLOOD GAS, ARTERIAL
Acid-Base Excess: 8.1 mmol/L — ABNORMAL HIGH (ref 0.0–2.0)
Bicarbonate: 35.1 mmol/L — ABNORMAL HIGH (ref 20.0–28.0)
FIO2: 0.36 %
O2 Saturation: 95 %
Patient temperature: 37
pCO2 arterial: 58 mmHg — ABNORMAL HIGH (ref 32–48)
pH, Arterial: 7.39 (ref 7.35–7.45)
pO2, Arterial: 69 mmHg — ABNORMAL LOW (ref 83–108)

## 2021-10-02 LAB — CULTURE, BLOOD (ROUTINE X 2)

## 2021-10-02 LAB — APTT
aPTT: 64 seconds — ABNORMAL HIGH (ref 24–36)
aPTT: 51 seconds — ABNORMAL HIGH (ref 24–36)
aPTT: 70 seconds — ABNORMAL HIGH (ref 24–36)

## 2021-10-02 LAB — CK: Total CK: 32 U/L — ABNORMAL LOW (ref 49–397)

## 2021-10-02 MED ORDER — FUROSEMIDE 10 MG/ML IJ SOLN
40.0000 mg | Freq: Two times a day (BID) | INTRAMUSCULAR | Status: DC
  Administered 2021-10-02: 40 mg via INTRAVENOUS
  Filled 2021-10-02: qty 4

## 2021-10-02 MED ORDER — IPRATROPIUM-ALBUTEROL 0.5-2.5 (3) MG/3ML IN SOLN
3.0000 mL | Freq: Four times a day (QID) | RESPIRATORY_TRACT | Status: DC | PRN
Start: 2021-10-02 — End: 2021-10-25

## 2021-10-02 MED ORDER — CHLORHEXIDINE GLUCONATE CLOTH 2 % EX PADS
6.0000 | MEDICATED_PAD | Freq: Once | CUTANEOUS | Status: AC
  Administered 2021-10-03: 6 via TOPICAL

## 2021-10-02 MED ORDER — HEPARIN BOLUS VIA INFUSION
1800.0000 [IU] | Freq: Once | INTRAVENOUS | Status: AC
  Administered 2021-10-02: 1800 [IU] via INTRAVENOUS
  Filled 2021-10-02: qty 1800

## 2021-10-02 MED ORDER — FUROSEMIDE 10 MG/ML IJ SOLN: 40.0000 mg | Freq: Every day | INTRAMUSCULAR | Status: DC

## 2021-10-02 MED ORDER — VANCOMYCIN HCL 1500 MG/300ML IV SOLN
1500.0000 mg | Freq: Two times a day (BID) | INTRAVENOUS | Status: AC
  Administered 2021-10-03 (×2): 1500 mg via INTRAVENOUS
  Filled 2021-10-02 (×3): qty 300

## 2021-10-02 NOTE — Progress Notes (Signed)
Occupational Therapy Treatment Patient Details Name: [name]. MRN: [id number] DOB: October 08, 1955 Today's Date: 10/02/2021   History of present illness [name] is a 25yoM who [facility] c progression of Left chornic heel ulcer. MRI showed significant progressive osteomyelitis in the left calcaneus. PMH HTN, IDDM2, morbid obesity, depression, CAD s/p CABG 2006 and PCI to RCA in 12/2020, chronic B venous stasis on Unna boots, chronic L foot ulcer, COPD, OSA on CPAP nightly, ai-fib, PAD, s/p multiple toe amputations.   OT comments  Upon entering the room, pt supine in bed in side lying position having BM at bed level. Pt needing total A for hygiene during session. Pt noted to have increased confusion and [place] and is much slower to process information. Pt rolls L <> R with max A and heavy use of bed rails. Total A to don clean hospital gown and +2 assistance to reposition pt in bed for comfort with lunch tray in front of him. Pt able to set up his own tray without assistance. Pt remains in bed with all needs within reach.    Recommendations for follow up therapy are one component of a multi-disciplinary discharge planning process, led by the attending physician.  Recommendations may be updated based on patient status, additional functional criteria and insurance authorization.    Follow Up Recommendations  Skilled nursing-short term rehab (<3 hours/day)    Assistance Recommended at Discharge Frequent or constant Supervision/Assistance  Patient can return home with the following  A lot of help with walking and/or transfers;A lot of help with bathing/dressing/bathroom;Assistance with cooking/housework;Help with stairs or ramp for entrance;Assist for transportation   Equipment Recommendations  Other (comment) (defer to next venue of care)       Precautions / Restrictions Precautions Precautions: Fall       Mobility Bed  Mobility Overal bed mobility: Needs Assistance Bed Mobility: Rolling Rolling: Max assist              Transfers                   General transfer comment: declined         ADL either performed or assessed with clinical judgement   ADL Overall ADL's : Needs assistance/impaired                                       General ADL Comments: total A for hygiene from bed level after BM and max A for rolling.    Extremity/Trunk Assessment Upper Extremity Assessment Upper Extremity Assessment: Generalized weakness   Lower Extremity Assessment Lower Extremity Assessment: Generalized weakness        Vision Patient Visual Report: No change from baseline            Cognition Arousal/Alertness: Awake/alert Behavior During Therapy: Flat affect Overall Cognitive Status: Impaired/Different from baseline                                 General Comments: Pt with increased confusion and reports , " I walked to the airport" last night. Very slow to process information.                   Pertinent Vitals/ Pain       Pain Assessment Pain Assessment:  Faces Faces Pain Scale: Hurts little more Pain Location: generalized Pain Descriptors / Indicators: Discomfort Pain Intervention(s): Limited activity within patient's tolerance, Repositioned, Premedicated before session         Frequency  Min 2X/week        Progress Toward Goals  OT Goals(current goals can now be found in the care plan section)  Progress towards OT goals: Progressing toward goals  Acute Rehab OT Goals Patient Stated Goal: to get better OT Goal Formulation: With patient Time For Goal Achievement: 10/13/21 Potential to Achieve Goals: Good  Plan Discharge plan remains appropriate;Frequency remains appropriate       AM-PAC OT "6 Clicks" Daily Activity     Outcome Measure   Help from another person eating meals?: None Help from another person taking care  of personal grooming?: A Little Help from another person toileting, which includes using toliet, bedpan, or urinal?: Total Help from another person bathing (including washing, rinsing, drying)?: A Lot Help from another person to put on and taking off regular upper body clothing?: A Lot Help from another person to put on and taking off regular lower body clothing?: Total 6 Click Score: 13    End of Session Equipment Utilized During Treatment: Rolling walker (2 wheels)  OT Visit Diagnosis: Unsteadiness on feet (R26.81);Repeated falls (R29.6);Muscle weakness (generalized) (M62.81)   Activity Tolerance Patient tolerated treatment well   Patient Left in bed;with call bell/phone within reach;with bed alarm set   Nurse Communication Mobility status        Time: 1207-1221 OT Time Calculation (min): 14 min  Charges: OT General Charges $OT Visit: 1 Visit OT Treatments $Self Care/Home Management : 8-22 mins  Darleen Crocker, MS, OTR/L , CBIS ascom [phone]  10/02/21, 1:22 PM

## 2021-10-02 NOTE — Consult Note (Signed)
ANTICOAGULATION CONSULT NOTE  Pharmacy Consult for IV Heparin Indication: atrial fibrillation  Patient Measurements: Height: '6\' 4"'$  (193 cm) Weight: (!) 184.7 kg (407 lb 3 oz) IBW/kg (Calculated) : 86.8 Heparin Dosing Weight: 130.7 kg  Labs: Recent Labs    09/30/21 0514 10/01/21 0539 10/01/21 1231 10/01/21 2055 10/02/21 0330 10/02/21 1251  HGB  --  11.5*  --   --  10.9*  --   HCT  --  39.5  --   --  37.1*  --   PLT  --  267  --   --  237  --   APTT  --   --  34 66* 64*  --   HEPARINUNFRC  --   --   --   --  0.31 0.43  CREATININE 1.34* 1.03  --   --  1.03  --   CKTOTAL  --   --   --   --   --  32*     Estimated Creatinine Clearance: 125.7 mL/min (by C-G formula based on SCr of 1.03 mg/dL).   Medical History: Past Medical History:  Diagnosis Date   Arrhythmia    atrial fibrillation   Asthma    CHF (congestive heart failure) (HCC)    COPD (chronic obstructive pulmonary disease) (HCC)    Coronary artery disease    Depression    Diabetes mellitus without complication (HCC)    Gout    History anabolic steroid use    Hyperlipidemia    Hypertension    Hypogonadism in male    Morbid obesity ([place])    Myocardial infarction ([place])    Peripheral vascular disease ([place])    Perirectal abscess    Pleurisy    Sleep apnea    CPAP at night, no oxygen   Varicella     Medications:  Apixaban 5 mg BID (last dose 6/3 at 0933)   Assessment: Patient is a 66 y/o M with medical history as above and including Afib on apixaban who is admitted with left foot ulcer / left foot OM. Vascular surgery following and plan is for L BKA vs AKA pending scheduling. Apixaban is on hold for impending procedure. Pharmacy consulted to initiate and manage heparin infusion peri-operatively for Afib.   CBC notable for stable anemia. Baseline aPTT pending. Baseline INR 1.3.   Date Time aPTT / HL Rate/comment 6/4 2055 66  2100 un/hr 6/5 0330 64 / 0.31  subthera/therapeutic 6/5 1251 0.43  Thera    Goal of Therapy:  Heparin level 0.3-0.7 units/ml aPTT 66 - 102 seconds Monitor platelets by anticoagulation protocol: Yes   Plan:  Continue heparin infusion at 2250 units/hr F/u  HL with am labs Monitor daily CBC, s/s of bleed  Chinita Greenland PharmD Clinical Pharmacist 10/02/2021

## 2021-10-02 NOTE — H&P (View-Only) (Signed)
[facility]  Daily Progress Note   Subjective  -   Patient resting.  No new complaints or problems.  Has now been off of Eliquis for 2 days  Objective Vitals:   10/02/21 0043 10/02/21 0503 10/02/21 0827 10/02/21 1202  BP: 114/80 109/79 (!) 129/95 120/72  Pulse: (!) 109 (!) 107 (!) 109 (!) 113  Resp: '18 18 18   '$ Temp: (!) 97.4 F (36.3 C) 98 F (36.7 C) (!) 97.5 F (36.4 C) 98.1 F (36.7 C)  TempSrc: Oral  Oral   SpO2: (!) 77% (!) 84% 100% (!) 89%  Weight:  (!) 184.7 kg    Height:  '6\' 4"'$  (1.93 m)      Intake/Output Summary (Last 24 hours) at 10/02/2021 1607 Last data filed at 10/02/2021 1103 Gross per 24 hour  Intake 753.05 ml  Output 1500 ml  Net -746.95 ml    PULM  CTAB CV  tachycardic VASC  left foot with erythema mild drainage  Laboratory CBC    Component Value Date/Time   WBC 5.0 10/02/2021 0330   HGB 10.9 (L) 10/02/2021 0330   HGB 14.7 07/12/2011 0919   HCT 37.1 (L) 10/02/2021 0330   HCT 45.4 07/12/2011 0919   PLT 237 10/02/2021 0330   PLT 156 07/12/2011 0919    BMET    Component Value Date/Time   NA 138 10/02/2021 0330   NA 141 07/12/2011 0919   K 4.5 10/02/2021 0330   K 4.4 07/12/2011 0919   CL 101 10/02/2021 0330   CL 102 07/12/2011 0919   CO2 30 10/02/2021 0330   CO2 29 07/12/2011 0919   GLUCOSE 158 (H) 10/02/2021 0330   GLUCOSE 76 07/12/2011 0919   BUN 34 (H) 10/02/2021 0330   BUN 16 05/05/2013 1022   CREATININE 1.03 10/02/2021 0330   CREATININE 0.71 05/05/2013 1022   CALCIUM 7.9 (L) 10/02/2021 0330   CALCIUM 8.8 07/12/2011 0919   GFRNONAA >60 10/02/2021 0330   GFRNONAA >60 05/05/2013 1022   GFRAA >60 01/06/2020 1853   GFRAA >60 05/05/2013 1022    Assessment/Planning:   Nonsalvageable left foot with severe infection Patient now agreeable to left leg amputation and this has been scheduled for tomorrow morning Risks and benefits discussed   [name]  10/02/2021, 4:07 PM

## 2021-10-02 NOTE — Consult Note (Signed)
[place] for IV Heparin Indication: atrial fibrillation  Patient Measurements: Height: '6\' 4"'$  (193 cm) Weight: (!) 184.5 kg (406 lb 12.8 oz) IBW/kg (Calculated) : 86.8 Heparin Dosing Weight: 130.7 kg  Labs: Recent Labs    09/29/21 0610 09/30/21 0514 10/01/21 0539 10/01/21 1231 10/01/21 2055 10/02/21 0330  HGB 11.5*  --  11.5*  --   --  10.9*  HCT 39.2  --  39.5  --   --  37.1*  PLT 252  --  267  --   --  237  APTT  --   --   --  34 66* 64*  HEPARINUNFRC  --   --   --   --   --  0.31  CREATININE 0.99 1.34* 1.03  --   --  1.03     Estimated Creatinine Clearance: 125.6 mL/min (by C-G formula based on SCr of 1.03 mg/dL).   Medical History: Past Medical History:  Diagnosis Date   Arrhythmia    atrial fibrillation   Asthma    CHF (congestive heart failure) (HCC)    COPD (chronic obstructive pulmonary disease) (HCC)    Coronary artery disease    Depression    Diabetes mellitus without complication (HCC)    Gout    History anabolic steroid use    Hyperlipidemia    Hypertension    Hypogonadism in male    Morbid obesity ([place])    Myocardial infarction ([place])    Peripheral vascular disease ([place])    Perirectal abscess    Pleurisy    Sleep apnea    CPAP at night, no oxygen   Varicella     Medications:  Apixaban 5 mg BID (last dose 6/3 at 0933)   Assessment: Patient is a 66 y/o M with medical history as above and including Afib on apixaban who is admitted with left foot ulcer / left foot OM. Vascular surgery following and plan is for L BKA vs AKA pending scheduling. Apixaban is on hold for impending procedure. Pharmacy consulted to initiate and manage heparin infusion peri-operatively for Afib.   CBC notable for stable anemia. Baseline aPTT pending. Baseline INR 1.3.   Date Time aPTT / HL Rate/comment 6/4 2055 66  2100 un/hr 6/5 0330 64 / 0.31  subthera/therapeutic  Goal of Therapy:  Heparin level 0.3-0.7 units/ml aPTT 66 -  102 seconds Monitor platelets by anticoagulation protocol: Yes   Plan:  Increase heparin infusion to 2250 units/hr Recheck HL in 6 hours after rate change Monitor daily CBC, s/s of bleed  Renda Rolls, PharmD, [facility] 10/02/2021 5:30 AM

## 2021-10-02 NOTE — TOC Progression Note (Signed)
Transition of Care (TOC) - Progression Note    Patient Details  Name: [name]. MRN: [id number] Date of Birth: 27-Sep-1955  Transition of Care [facility]) CM/SW Contact  Pete Pelt, RN Phone Number: 10/02/2021, 3:28 PM  Clinical Narrative:  Vascular surgery to be scheduled, and patient will be re-evaluated after surgery for disposition  TOC to follow.    Expected Discharge Plan: [place] Barriers to Discharge: Continued Medical Work up  Expected Discharge Plan and Services Expected Discharge Plan: [place] In-house Referral: NA   Post Acute Care Choice: Resumption of Svcs/PTA Provider Living arrangements for the past 2 months: Single Family Home                                       Social Determinants of Health (SDOH) Interventions    Readmission Risk Interventions    09/29/2021   11:12 AM  Readmission Risk Prevention Plan  Transportation Screening Complete  PCP or Specialist Appt within 3-5 Days Complete  HRI or [place] Complete  Social Work Consult for [place] Planning/Counseling Complete  Palliative Care Screening Not Applicable  Medication Review Press photographer) Complete

## 2021-10-02 NOTE — Progress Notes (Addendum)
  Progress Note   Patient: [name]. SVX:[id number] DOB: 1955/09/19 DOA: 09/28/2021     4 DOS: the patient was seen and examined on 10/02/2021   Brief hospital course: [name]. is a 66 y.o. male with medical history significant for chronic diastolic dysfunction CHF with last known LVEF of 50 to 55%, hypertension, coronary artery disease, depression, COPD, A-fib, morbid obesity (BMI 49), obstructive sleep apnea, status post recent wound debridement and partial calcanectomy (05/16) currently on oral antibiotic therapy after completion of IV who presents to the ER for evaluation of bilateral lower extremity swelling as well as increased redness and swelling involving the left lower extremity mostly over the heel. MRI of the foot showed left foot osteomyelitis, scheduled for foot amputation.  Also placed on vancomycin and Zosyn. Patient also has evidence of acute on chronic diastolic congestive heart failure.  Started IV Lasix.  Assessment and Plan:  Left foot ulcer. Left foot osteomyelitis. MRSA bacteremia. Sepsis ruled out. MRI of the foot showed a severe osteomyelitis.  Currently pending left foot amputation.  Blood culture 1/4 positive for MRSA,  Patient is pending foot amputation.  Scheduled for tomorrow. Continue current antibiotics Appreciate ID consult.  Acute metabolic encephalopathy. Patient slept better last night, Wood confused today.  Acute on chronic diastolic congestive heart failure. Acute hypoxemic respiratory failure. POA, ruled in.  Acute kidney injury secondary to diuretics. Patient renal function has normalized.  However, patient still requiring oxygen.  Still has some volume overload, will restart IV Lasix.  Follow closely.  Uncontrolled type 2 diabetes with hyperglycemia. Continue current regimen.   Moderate protein calorie malnutrition. Continue protein supplement.  Morbid obesity. BMI 48.69 OSA Continue CPAP while asleep.  Paroxysmal  atrial fibrillation with rapid ventricle response Anticoagulation on hold for anticipated surgery.     Subjective:  Patient slept better last night. Patient still has confusion but better today. Still requiring oxygen, short of breath with exertion.  Physical Exam: Vitals:   10/02/21 0043 10/02/21 0503 10/02/21 0827 10/02/21 1202  BP: 114/80 109/79 (!) 129/95 120/72  Pulse: (!) 109 (!) 107 (!) 109 (!) 113  Resp: '18 18 18   '$ Temp: (!) 97.4 F (36.3 C) 98 F (36.7 C) (!) 97.5 F (36.4 C) 98.1 F (36.7 C)  TempSrc: Oral  Oral   SpO2: (!) 77% (!) 84% 100% (!) 89%  Weight:  (!) 184.7 kg    Height:       General exam: Appears calm and comfortable  Respiratory system: Decreased breathing sounds. Respiratory effort normal. Cardiovascular system: Appear regular. No JVD, murmurs, rubs, gallops or clicks. No pedal edema. Gastrointestinal system: Abdomen is nondistended, soft and nontender. No organomegaly or masses felt. Normal bowel sounds heard. Central nervous system: Alert and oriented x2. No focal neurological deficits. Extremities: Symmetric 5 x 5 power. Skin: No rashes, lesions or ulcers ' Data Reviewed:  All lab results reviewed  Family Communication: wife updated via phone  Disposition: Status is: Inpatient Remains inpatient appropriate because: Severity of disease, IV treatment, pending procedure.  Planned Discharge Destination:  Pending decision    Time spent: 35 minutes  Author: Sharen Hones, MD 10/02/2021 12:26 PM  For on call review [email or web address].

## 2021-10-02 NOTE — Consult Note (Signed)
ANTICOAGULATION CONSULT NOTE  Pharmacy Consult for IV Heparin Indication: atrial fibrillation  Patient Measurements: Height: '6\' 4"'$  (193 cm) Weight: (!) 184.7 kg (407 lb 3 oz) IBW/kg (Calculated) : 86.8 Heparin Dosing Weight: 130.7 kg  Labs: Recent Labs    09/30/21 0514 10/01/21 0539 10/01/21 1231 10/01/21 2055 10/02/21 0330 10/02/21 1251 10/02/21 1343  HGB  --  11.5*  --   --  10.9*  --   --   HCT  --  39.5  --   --  37.1*  --   --   PLT  --  267  --   --  237  --   --   APTT  --   --    < > 66* 64*  --  51*  HEPARINUNFRC  --   --   --   --  0.31 0.43  --   CREATININE 1.34* 1.03  --   --  1.03  --   --   CKTOTAL  --   --   --   --   --  32*  --    < > = values in this interval not displayed.     Estimated Creatinine Clearance: 125.7 mL/min (by C-G formula based on SCr of 1.03 mg/dL).   Medical History: Past Medical History:  Diagnosis Date   Arrhythmia    atrial fibrillation   Asthma    CHF (congestive heart failure) (HCC)    COPD (chronic obstructive pulmonary disease) (HCC)    Coronary artery disease    Depression    Diabetes mellitus without complication (HCC)    Gout    History anabolic steroid use    Hyperlipidemia    Hypertension    Hypogonadism in male    Morbid obesity ([place])    Myocardial infarction ([place])    Peripheral vascular disease ([place])    Perirectal abscess    Pleurisy    Sleep apnea    CPAP at night, no oxygen   Varicella     Medications:  Apixaban 5 mg BID (last dose 6/3 at 0933)   Assessment: Patient is a 66 y/o M with medical history as above and including Afib on apixaban who is admitted with left foot ulcer / left foot OM. Vascular surgery following and plan is for L BKA vs AKA pending scheduling. Apixaban is on hold for impending procedure. Pharmacy consulted to initiate and manage heparin infusion peri-operatively for Afib.   CBC notable for stable anemia. Baseline aPTT pending. Baseline INR 1.3.   Date Time aPTT /  HL Rate/comment 6/4 2055 66  2100 un/hr 6/5 0330 64 / 0.31  subthera/therapeutic 6/5 1251 51/0.43 Subthera/Thera-  inc from 2250 u/hr to 2300 u/hr  Goal of Therapy:  Heparin level 0.3-0.7 units/ml aPTT 66 - 102 seconds Monitor platelets by anticoagulation protocol: Yes   Plan:  Bolus 1800 units Increase heparin infusion at 2300 units/hr F/u  aPTT in 6 hrs and HL with am labs.  Switch to HL when aPTT/HL correlating Monitor daily CBC, s/s of bleed  Chinita Greenland PharmD Clinical Pharmacist 10/02/2021

## 2021-10-02 NOTE — Progress Notes (Signed)
Pharmacy Antibiotic Note  [name]. is a 66 y.o. male admitted on 09/28/2021 with bacteremia (MRSA)/heel osteomyelitis.  Pharmacy has been consulted for Vancomycin dosing.  Renal function improved SCR 0.99>1.34>1.03 will adjust dosing as appropriate -planning BKA  10/03/21  Plan: Vancomycin 1500 mg IV Q 12 hrs.  Goal AUC 400-550. Expected AUC: 418 SCr used: 1.03 Vd used: 0.5, BMI 48.60  Pharmacy will continue to follow and will adjust abx dosing whenever warranted.   Height: '6\' 4"'$  (193 cm) Weight: (!) 184.7 kg (407 lb 3 oz) IBW/kg (Calculated) : 86.8  Temp (24hrs), Avg:97.8 F (36.6 C), Min:97.4 F (36.3 C), Max:98.1 F (36.7 C)  Recent Labs  Lab 09/28/21 1400 09/28/21 1404 09/28/21 1525 09/29/21 0610 09/30/21 0514 10/01/21 0539 10/02/21 0330  WBC  --  9.3  --  6.8  --  5.7 5.0  CREATININE  --  1.01  --  0.99 1.34* 1.03 1.03  LATICACIDVEN 1.6  --  1.2  --   --   --   --      Estimated Creatinine Clearance: 125.7 mL/min (by C-G formula based on SCr of 1.03 mg/dL).    Allergies  Allergen Reactions   Penicillins Other (See Comments)    Happened at 66 years old and pt. stated he passed out  He has tolerated amoxicillin/clavulanate and ampicillin/sulbactam   Statins     Other reaction(s): Muscle Pain Causes legs to ache per pt    Antimicrobials this admission: 6/1 1837 Vancomycin >> 1000 mg x 1 dose 6/1 Zosyn >> x 1 dose 6/2 Ceftriaxone >>   6/2 Flagyl >>     x 7 days 6/3 Vancomycin >>  Microbiology results: 6/01 BCx: 1 (aerobic) of 4 bottles w/ MRSA 6/4 L foot: GPC  Thank you for allowing pharmacy to be a part of this patient's care.  Noralee Space, PharmD Clinical Pharmacist 10/02/2021 1:45 PM

## 2021-10-02 NOTE — Consult Note (Signed)
ANTICOAGULATION CONSULT NOTE  Pharmacy Consult for IV Heparin Indication: atrial fibrillation  Patient Measurements: Height: '6\' 4"'$  (193 cm) Weight: (!) 184.7 kg (407 lb 3 oz) IBW/kg (Calculated) : 86.8 Heparin Dosing Weight: 130.7 kg  Labs: Recent Labs    09/30/21 0514 10/01/21 0539 10/01/21 1231 10/02/21 0330 10/02/21 1251 10/02/21 1343 10/02/21 2119  HGB  --  11.5*  --  10.9*  --   --   --   HCT  --  39.5  --  37.1*  --   --   --   PLT  --  267  --  237  --   --   --   APTT  --   --    < > 64*  --  51* 70*  HEPARINUNFRC  --   --   --  0.31 0.43  --   --   CREATININE 1.34* 1.03  --  1.03  --   --   --   CKTOTAL  --   --   --   --  32*  --   --    < > = values in this interval not displayed.     Estimated Creatinine Clearance: 125.7 mL/min (by C-G formula based on SCr of 1.03 mg/dL).   Medical History: Past Medical History:  Diagnosis Date   Arrhythmia    atrial fibrillation   Asthma    CHF (congestive heart failure) (HCC)    COPD (chronic obstructive pulmonary disease) (HCC)    Coronary artery disease    Depression    Diabetes mellitus without complication (HCC)    Gout    History anabolic steroid use    Hyperlipidemia    Hypertension    Hypogonadism in male    Morbid obesity ([place])    Myocardial infarction ([place])    Peripheral vascular disease ([place])    Perirectal abscess    Pleurisy    Sleep apnea    CPAP at night, no oxygen   Varicella     Medications:  Apixaban 5 mg BID (last dose 6/3 at 0933)   Assessment: Patient is a 66 y/o M with medical history as above and including Afib on apixaban who is admitted with left foot ulcer / left foot OM. Vascular surgery following and plan is for L BKA vs AKA pending scheduling. Apixaban is on hold for impending procedure. Pharmacy consulted to initiate and manage heparin infusion peri-operatively for Afib.   CBC notable for stable anemia. Baseline aPTT pending. Baseline INR 1.3.   Date Time aPTT /  HL Rate/comment 6/4 2055 66  2100 un/hr 6/5 0330 64 / 0.31  subthera/therapeutic 6/5 1251 51/0.43 Subthera/Thera-  inc from 2250 u/hr to 2300 u/hr 6/5       2119   70                    Therapeutic @ 2300 units/hr.   Goal of Therapy:  Heparin level 0.3-0.7 units/ml aPTT 66 - 102 seconds Monitor platelets by anticoagulation protocol: Yes   Plan:  6/5 @ 2119:   aPTT = 70 , therapeutic X 1 @ 2300 units/hr Left leg amputation planned for 6/6 AM.   Will d/c heparin drip at least 6 hrs prior to procedure.  Will recheck aPTT and HL on 6/6 @ 0300.   Clinical Pharmacist 10/02/2021

## 2021-10-02 NOTE — Progress Notes (Signed)
ID: [name] is a 66 y.o. male  Principal Problem:   Osteomyelitis ([place]) Active Problems:   Essential hypertension, benign   RLS (restless legs syndrome)   Obesity, Class III, BMI 40-49.9 (morbid obesity) (HCC)   Acute on chronic diastolic CHF (congestive heart failure) (HCC)   Foot ulcer with fat layer exposed, left (HCC)   AF (paroxysmal atrial fibrillation) (HCC)   Uncontrolled type 2 diabetes mellitus with hyperglycemia, with long-term current use of insulin (HCC)   Malnutrition of moderate degree   Generalized weakness   Acute metabolic encephalopathy    Subjective: Pt says he is okay But he is confused  Medications:   (feeding supplement) PROSource Plus  30 mL Oral TID BM   amiodarone  200 mg Oral Daily   vitamin C  500 mg Oral Daily   aspirin EC  81 mg Oral Daily   chlorhexidine  15 mL Mouth Rinse BID   cholecalciferol  1,000 Units Oral Daily   diltiazem  90 mg Oral Q8H   ezetimibe  10 mg Oral Daily   feeding supplement (GLUCERNA SHAKE)  237 mL Oral BID BM   ferrous sulfate  325 mg Oral Q breakfast   furosemide  40 mg Intravenous Q12H   gabapentin  300 mg Oral QHS   insulin aspart  0-20 Units Subcutaneous TID WC   insulin aspart  5 Units Subcutaneous TID WC   insulin glargine-yfgn  25 Units Subcutaneous BID   ipratropium-albuterol  3 mL Nebulization Q6H   mouth rinse  15 mL Mouth Rinse q12n4p   metoprolol tartrate  100 mg Oral BID   multivitamin with minerals  1 tablet Oral Daily   nystatin   Topical BID   pramipexole  2 mg Oral QHS   primidone  250 mg Oral BID   QUEtiapine  25 mg Oral QHS   tamsulosin  0.4 mg Oral Daily   vitamin B-12  1,000 mcg Oral Daily   zinc sulfate  220 mg Oral Daily    Objective: Vital signs in last 24 hours: Temp:  [97.4 F (36.3 C)-98.4 F (36.9 C)] 97.5 F (36.4 C) (06/05 0827) Pulse Rate:  [107-112] 109 (06/05 0827) Resp:  [17-18] 18 (06/05 0827) BP: (109-137)/(75-95) 129/95 (06/05 0827) SpO2:  [77 %-100  %] 100 % (06/05 0827) Weight:  [184.7 kg] 184.7 kg (06/05 0503)    PHYSICAL EXAM:  General: Alert,oriented in place and person confused Lungs: b/l air entry Heart: irregular Abdomen: Soft, non-tender,not distended. Bowel sounds normal. No masses Extremities:left heel   Skin: No rashes or lesions. Or bruising Lymph: Cervical, supraclavicular normal. Neurologic: Grossly non-focal  Lab Results Recent Labs    10/01/21 0539 10/02/21 0330  WBC 5.7 5.0  HGB 11.5* 10.9*  HCT 39.5 37.1*  NA 136 138  K 4.4 4.5  CL 100 101  CO2 30 30  BUN 31* 34*  CREATININE 1.03 1.03     Microbiology: 09/28/2021 blood culture 1 out of 4 bottle MRSA    Assessment/Plan: MRSA bacteremia secondary to the left heel wound.  Low bioburden as the blood culture came positive after 3 days and in 1 bottle 2D echo has been done Patient is currently on vancomycin, ceftriaxone and Flagyl. He is undergoing BKA tomorrow May switch to Dapto after that  Left heel ulcer with osteomyelitis.  He is status post partial calcanectomy in April 2023 and received 6 weeks of IV antibiotics for Klebsiella, Proteus, anaerobes.  Following which she had  been on Augmentin.  AKI resolved  Severe venous edema of the legs  Paroxysmal A-fib now in atrial flutter.  On amiodarone, metoprolol and Eliquis  Coronary artery disease status post CABG  Confusion could be from infection but can be from CO2 retention and not being on CPAP.Marland Kitchen No evidence of UTI  Discussed the management with the wife and the hospitalist.

## 2021-10-02 NOTE — Progress Notes (Signed)
[facility] Cardiology  Patient ID: [name]. MRN: [id number] DOB/AGE: 1955-09-26 66 y.o.   Admit date: 09/28/2021 Referring Physician Dr. Francine Graven Primary Physician Dr. Felipa Furnace Primary Cardiologist Dr. Donnelly Angelica Reason for Consultation acute on chronic HFpEF    HPI: Barret "Alec" Philip Wood. is a [phone] with a PMH significant for CAD s/p CABG 2006 and PCI of RCA 01/2020, HFpEF (LVEF 50-55% 04/2021), A. fib / flutter on apixaban, amiodarone and digoxin, HTN, BPH, morbid obesity, poorly controlled type 2 diabetes,  PAD s/p left third toe amputation, chronic venous stasis on Unna boots, OSA on CPAP who presented to [facility] ED on 09/28/21 with increasing pain and swelling in his left leg, and imaging concerning for worsening osteomyelitis of his left foot. He has has onoging infections needing wound debridement in his left lower leg over the past several months. Cardiology is consulted for assistance with his heart failure.    Interval History:  -delirious overnight -diuresing well, but also getting lots of volume through IV vanc -breathing improving, no chest pain, palpitations   Vitals:   10/01/21 2041 10/02/21 0043 10/02/21 0503 10/02/21 0827  BP: (!) 137/92 114/80 109/79 (!) 129/95  Pulse: (!) 112 (!) 109 (!) 107 (!) 109  Resp: '18 18 18 18  '$ Temp: 98 F (36.7 C) (!) 97.4 F (36.3 C) 98 F (36.7 C) (!) 97.5 F (36.4 C)  TempSrc: Oral Oral  Oral  SpO2: 96% (!) 77% (!) 84% 100%  Weight:   (!) 184.7 kg   Height:         Intake/Output Summary (Last 24 hours) at 10/02/2021 1129 Last data filed at 10/02/2021 1103 Gross per 24 hour  Intake 753.05 ml  Output 1500 ml  Net -746.95 ml    PHYSICAL EXAM General: Ill-appearing Caucasian male,  in no acute distress. Laying at incline in PCU bed HEENT:  Normocephalic and atraumatic. Neck:   No JVD.  Lungs: Normal respiratory effort on room air. Clear bilaterally to auscultation anteriorly. No wheezes, crackles, rhonchi.  Heart: Tachy  but regular. Normal S1 and S2 without gallops or murmurs. Abdomen: Non-distended appearing.  Msk: Normal strength and tone for age. Extremities:  Significant bilateral lower extremity edema left greater than right with woody appearance and scaling on right lower leg.  Left lower leg with circumferential erythema and warmth to palpation.  Both heels wrapped in wound dressings.   Both feet s/p multiple toes amputated Neuro: Alert and oriented X 3. Psych: Despondent mood, answers questions appropriately.    LABS: Basic Metabolic Panel: Recent Labs    09/30/21 0514 10/01/21 0539 10/02/21 0330  NA 138 136 138  K 5.1 4.4 4.5  CL 98 100 101  CO2 33* 30 30  GLUCOSE 214* 232* 158*  BUN 26* 31* 34*  CREATININE 1.34* 1.03 1.03  CALCIUM 8.7* 8.4* 7.9*  MG 2.0 2.0  --     Liver Function Tests: No results for input(s): AST, ALT, ALKPHOS, BILITOT, PROT, ALBUMIN in the last 72 hours.  No results for input(s): LIPASE, AMYLASE in the last 72 hours. CBC: Recent Labs    10/01/21 0539 10/02/21 0330  WBC 5.7 5.0  HGB 11.5* 10.9*  HCT 39.5 37.1*  MCV 91.9 92.1  PLT 267 237    Cardiac Enzymes: No results for input(s): CKTOTAL, CKMB, CKMBINDEX, TROPONINI in the last 72 hours. BNP: Invalid input(s): POCBNP D-Dimer: No results for input(s): DDIMER in the last 72 hours. Hemoglobin A1C: No results for input(s): HGBA1C in  the last 72 hours. Fasting Lipid Panel: No results for input(s): CHOL, HDL, LDLCALC, TRIG, CHOLHDL, LDLDIRECT in the last 72 hours. Thyroid Function Tests: No results for input(s): TSH, T4TOTAL, T3FREE, THYROIDAB in the last 72 hours.  Invalid input(s): FREET3 Anemia Panel: No results for input(s): VITAMINB12, FOLATE, FERRITIN, TIBC, IRON, RETICCTPCT in the last 72 hours.  No results found.   Echo LVEF 50-55% 05/10/2021  TELEMETRY: Atrial fibrillation 111 bpm:  ASSESSMENT AND PLAN:  Principal Problem:   Osteomyelitis (HCC) Active Problems:   Essential  hypertension, benign   RLS (restless legs syndrome)   Obesity, Class III, BMI 40-49.9 (morbid obesity) (HCC)   Acute on chronic diastolic CHF (congestive heart failure) (HCC)   Foot ulcer with fat layer exposed, left (HCC)   AF (paroxysmal atrial fibrillation) ([place])   Uncontrolled type 2 diabetes mellitus with hyperglycemia, with long-term current use of insulin (HCC)   Malnutrition of moderate degree   Generalized weakness   Acute metabolic encephalopathy    1. Acute on chronic diastolic congestive heart failure, HFpEF, LVEF 50-55% 05/10/2021, good diuresis after IV furosemide 2.  Paroxysmal atrial fibrillation/atrial flutter, currently in atrial flutter heart rate 115 bpm, on amiodarone and metoprolol for rate control and Eliquis for stroke prevention, cardizem '90mg'$  q8h added by primary team 3.  Coronary artery disease, status post CABG 2006, PCI RCA 01/2020, currently without chest pain 4.  Left foot osteomyelitis on IV vancomycin, left BKA pending 5.  Acute kidney injury, improving to Cr 1.03 and >60 after lasix dose decreased   Recommendations   1.  Agree with current therapy 2.  Continue IV lasix '40mg'$  BID, BMP daily  3.  Hold Eliquis at least 4 doses for surgery (last dose morning 6/3) 4.  Continue amiodarone, metoprolol tartrate, and cardizem for rate and rhythm control 5.  Heparin drip till surgery 6.  Left AKA/BKA pending   This patient's plan of care was discussed and created with Dr. Saralyn Pilar and he is in agreement.    Tristan Schroeder, PA-C 10/02/2021 11:29 AM

## 2021-10-02 NOTE — Progress Notes (Signed)
[facility]  Daily Progress Note   Subjective  -   Patient resting.  No new complaints or problems.  Has now been off of Eliquis for 2 days  Objective Vitals:   10/02/21 0043 10/02/21 0503 10/02/21 0827 10/02/21 1202  BP: 114/80 109/79 (!) 129/95 120/72  Pulse: (!) 109 (!) 107 (!) 109 (!) 113  Resp: '18 18 18   '$ Temp: (!) 97.4 F (36.3 C) 98 F (36.7 C) (!) 97.5 F (36.4 C) 98.1 F (36.7 C)  TempSrc: Oral  Oral   SpO2: (!) 77% (!) 84% 100% (!) 89%  Weight:  (!) 184.7 kg    Height:  '6\' 4"'$  (1.93 m)      Intake/Output Summary (Last 24 hours) at 10/02/2021 1607 Last data filed at 10/02/2021 1103 Gross per 24 hour  Intake 753.05 ml  Output 1500 ml  Net -746.95 ml    PULM  CTAB CV  tachycardic VASC  left foot with erythema mild drainage  Laboratory CBC    Component Value Date/Time   WBC 5.0 10/02/2021 0330   HGB 10.9 (L) 10/02/2021 0330   HGB 14.7 07/12/2011 0919   HCT 37.1 (L) 10/02/2021 0330   HCT 45.4 07/12/2011 0919   PLT 237 10/02/2021 0330   PLT 156 07/12/2011 0919    BMET    Component Value Date/Time   NA 138 10/02/2021 0330   NA 141 07/12/2011 0919   K 4.5 10/02/2021 0330   K 4.4 07/12/2011 0919   CL 101 10/02/2021 0330   CL 102 07/12/2011 0919   CO2 30 10/02/2021 0330   CO2 29 07/12/2011 0919   GLUCOSE 158 (H) 10/02/2021 0330   GLUCOSE 76 07/12/2011 0919   BUN 34 (H) 10/02/2021 0330   BUN 16 05/05/2013 1022   CREATININE 1.03 10/02/2021 0330   CREATININE 0.71 05/05/2013 1022   CALCIUM 7.9 (L) 10/02/2021 0330   CALCIUM 8.8 07/12/2011 0919   GFRNONAA >60 10/02/2021 0330   GFRNONAA >60 05/05/2013 1022   GFRAA >60 01/06/2020 1853   GFRAA >60 05/05/2013 1022    Assessment/Planning:   Nonsalvageable left foot with severe infection Patient now agreeable to left leg amputation and this has been scheduled for tomorrow morning Risks and benefits discussed   [name]  10/02/2021, 4:07 PM

## 2021-10-03 ENCOUNTER — Inpatient Hospital Stay: Payer: Medicare Other | Admitting: Certified Registered Nurse Anesthetist

## 2021-10-03 ENCOUNTER — Encounter: Payer: Self-pay | Admitting: Internal Medicine

## 2021-10-03 ENCOUNTER — Other Ambulatory Visit: Payer: Self-pay

## 2021-10-03 ENCOUNTER — Encounter
Admission: EM | Disposition: A | Discharge status: Skilled Nursing Facility | Payer: Self-pay | Source: Home / Self Care | Attending: Internal Medicine

## 2021-10-03 ENCOUNTER — Ambulatory Visit: Payer: Medicare Other | Admitting: Infectious Diseases

## 2021-10-03 DIAGNOSIS — I5033 Acute on chronic diastolic (congestive) heart failure: Secondary | ICD-10-CM | POA: Diagnosis not present

## 2021-10-03 DIAGNOSIS — R7881 Bacteremia: Secondary | ICD-10-CM | POA: Diagnosis not present

## 2021-10-03 DIAGNOSIS — M86172 Other acute osteomyelitis, left ankle and foot: Secondary | ICD-10-CM | POA: Diagnosis not present

## 2021-10-03 DIAGNOSIS — I70262 Atherosclerosis of native arteries of extremities with gangrene, left leg: Secondary | ICD-10-CM | POA: Diagnosis not present

## 2021-10-03 DIAGNOSIS — M86672 Other chronic osteomyelitis, left ankle and foot: Secondary | ICD-10-CM | POA: Diagnosis not present

## 2021-10-03 DIAGNOSIS — G9341 Metabolic encephalopathy: Secondary | ICD-10-CM | POA: Diagnosis not present

## 2021-10-03 DIAGNOSIS — L97429 Non-pressure chronic ulcer of left heel and midfoot with unspecified severity: Secondary | ICD-10-CM

## 2021-10-03 DIAGNOSIS — G4733 Obstructive sleep apnea (adult) (pediatric): Secondary | ICD-10-CM | POA: Diagnosis present

## 2021-10-03 DIAGNOSIS — B9562 Methicillin resistant Staphylococcus aureus infection as the cause of diseases classified elsewhere: Secondary | ICD-10-CM | POA: Diagnosis not present

## 2021-10-03 DIAGNOSIS — J9601 Acute respiratory failure with hypoxia: Secondary | ICD-10-CM | POA: Diagnosis present

## 2021-10-03 HISTORY — PX: AMPUTATION: SHX166

## 2021-10-03 LAB — CBC
HCT: 39.6 % (ref 39.0–52.0)
Hemoglobin: 11.6 g/dL — ABNORMAL LOW (ref 13.0–17.0)
MCH: 26.7 pg (ref 26.0–34.0)
MCHC: 29.3 g/dL — ABNORMAL LOW (ref 30.0–36.0)
MCV: 91 fL (ref 80.0–100.0)
Platelets: 232 10*3/uL (ref 150–400)
RBC: 4.35 MIL/uL (ref 4.22–5.81)
RDW: 16.1 % — ABNORMAL HIGH (ref 11.5–15.5)
WBC: 5.4 10*3/uL (ref 4.0–10.5)
nRBC: 0 % (ref 0.0–0.2)

## 2021-10-03 LAB — BLOOD GAS, ARTERIAL
Acid-Base Excess: 6.1 mmol/L — ABNORMAL HIGH (ref 0.0–2.0)
Bicarbonate: 34.5 mmol/L — ABNORMAL HIGH (ref 20.0–28.0)
Delivery systems: POSITIVE
Expiratory PAP: 5 cmH2O
FIO2: 36 %
Inspiratory PAP: 12 cmH2O
O2 Saturation: 93.5 %
Patient temperature: 37
pCO2 arterial: 67 mmHg (ref 32–48)
pH, Arterial: 7.32 — ABNORMAL LOW (ref 7.35–7.45)
pO2, Arterial: 69 mmHg — ABNORMAL LOW (ref 83–108)

## 2021-10-03 LAB — BASIC METABOLIC PANEL
Anion gap: 6 (ref 5–15)
BUN: 35 mg/dL — ABNORMAL HIGH (ref 8–23)
CO2: 33 mmol/L — ABNORMAL HIGH (ref 22–32)
Calcium: 8.2 mg/dL — ABNORMAL LOW (ref 8.9–10.3)
Chloride: 100 mmol/L (ref 98–111)
Creatinine, Ser: 1 mg/dL (ref 0.61–1.24)
GFR, Estimated: >60 mL/min (ref >60)
Glucose, Bld: 142 mg/dL — ABNORMAL HIGH (ref 70–99)
Potassium: 4.7 mmol/L (ref 3.5–5.1)
Sodium: 139 mmol/L (ref 135–145)

## 2021-10-03 LAB — GLUCOSE, CAPILLARY
Glucose-Capillary: 146 mg/dL — ABNORMAL HIGH (ref 70–99)
Glucose-Capillary: 170 mg/dL — ABNORMAL HIGH (ref 70–99)
Glucose-Capillary: 189 mg/dL — ABNORMAL HIGH (ref 70–99)
Glucose-Capillary: 195 mg/dL — ABNORMAL HIGH (ref 70–99)
Glucose-Capillary: 170 mg/dL — ABNORMAL HIGH (ref 70–99)

## 2021-10-03 LAB — CULTURE, BLOOD (ROUTINE X 2): Culture: NO GROWTH

## 2021-10-03 LAB — APTT: aPTT: 35 seconds (ref 24–36)

## 2021-10-03 LAB — PREPARE RBC (CROSSMATCH)

## 2021-10-03 LAB — MAGNESIUM: Magnesium: 2 mg/dL (ref 1.7–2.4)

## 2021-10-03 LAB — BRAIN NATRIURETIC PEPTIDE: B Natriuretic Peptide: 527.6 pg/mL — ABNORMAL HIGH (ref 0.0–100.0)

## 2021-10-03 LAB — HEPARIN LEVEL (UNFRACTIONATED): Heparin Unfractionated: <0.1 IU/mL — ABNORMAL LOW (ref 0.30–0.70)

## 2021-10-03 SURGERY — AMPUTATION BELOW KNEE
Anesthesia: General | Site: Knee | Laterality: Left

## 2021-10-03 MED ORDER — BUPIVACAINE HCL (PF) 0.5 % IJ SOLN
INTRAMUSCULAR | Status: AC
Start: 2021-10-03 — End: ?
  Filled 2021-10-03: qty 30

## 2021-10-03 MED ORDER — ROCURONIUM BROMIDE 100 MG/10ML IV SOLN
INTRAVENOUS | Status: DC | PRN
  Administered 2021-10-03: 10 mg via INTRAVENOUS
  Administered 2021-10-03 (×3): 30 mg via INTRAVENOUS

## 2021-10-03 MED ORDER — CHLORHEXIDINE GLUCONATE CLOTH 2 % EX PADS
6.0000 | MEDICATED_PAD | Freq: Every day | CUTANEOUS | Status: DC
  Administered 2021-10-04 – 2021-10-07 (×4): 6 via TOPICAL

## 2021-10-03 MED ORDER — OXYCODONE-ACETAMINOPHEN 5-325 MG PO TABS
1.0000 | ORAL_TABLET | ORAL | Status: DC | PRN
  Administered 2021-10-04: 2 via ORAL
  Administered 2021-10-05 – 2021-10-06 (×2): 1 via ORAL
  Filled 2021-10-03: qty 2
  Filled 2021-10-03 (×2): qty 1

## 2021-10-03 MED ORDER — 0.9 % SODIUM CHLORIDE (POUR BTL) OPTIME
TOPICAL | Status: DC | PRN
  Administered 2021-10-03: 1000 mL

## 2021-10-03 MED ORDER — ALBUMIN HUMAN 5 % IV SOLN: INTRAVENOUS | Status: DC | PRN

## 2021-10-03 MED ORDER — ONDANSETRON HCL 4 MG/2ML IJ SOLN
INTRAMUSCULAR | Status: DC | PRN
  Administered 2021-10-03: 4 mg via INTRAVENOUS

## 2021-10-03 MED ORDER — ONDANSETRON HCL 4 MG/2ML IJ SOLN
INTRAMUSCULAR | Status: AC
  Filled 2021-10-03: qty 2

## 2021-10-03 MED ORDER — DEXAMETHASONE SODIUM PHOSPHATE 10 MG/ML IJ SOLN
INTRAMUSCULAR | Status: DC | PRN
  Administered 2021-10-03: 4 mg via INTRAVENOUS

## 2021-10-03 MED ORDER — FENTANYL CITRATE (PF) 100 MCG/2ML IJ SOLN
25.0000 ug | INTRAMUSCULAR | Status: DC | PRN
  Administered 2021-10-03: 25 ug via INTRAVENOUS
  Administered 2021-10-03: 50 ug via INTRAVENOUS

## 2021-10-03 MED ORDER — LIDOCAINE HCL (PF) 2 % IJ SOLN
INTRAMUSCULAR | Status: AC
  Filled 2021-10-03: qty 5

## 2021-10-03 MED ORDER — ACETAMINOPHEN 10 MG/ML IV SOLN
INTRAVENOUS | Status: AC
  Filled 2021-10-03: qty 100

## 2021-10-03 MED ORDER — SUCCINYLCHOLINE CHLORIDE 200 MG/10ML IV SOSY
PREFILLED_SYRINGE | INTRAVENOUS | Status: AC
  Filled 2021-10-03: qty 10

## 2021-10-03 MED ORDER — SODIUM CHLORIDE 0.9 % IV SOLN
6.0000 mg/kg | INTRAVENOUS | Status: DC
  Administered 2021-10-04 – 2021-10-22 (×19): 800 mg via INTRAVENOUS
  Filled 2021-10-03 (×22): qty 16

## 2021-10-03 MED ORDER — ROCURONIUM BROMIDE 10 MG/ML (PF) SYRINGE
PREFILLED_SYRINGE | INTRAVENOUS | Status: AC
  Filled 2021-10-03: qty 10

## 2021-10-03 MED ORDER — OXYCODONE HCL 5 MG/5ML PO SOLN: 5.0000 mg | Freq: Once | ORAL | Status: DC | PRN

## 2021-10-03 MED ORDER — EPHEDRINE SULFATE (PRESSORS) 50 MG/ML IJ SOLN
INTRAMUSCULAR | Status: DC | PRN
  Administered 2021-10-03: 10 mg via INTRAVENOUS
  Administered 2021-10-03: 5 mg via INTRAVENOUS

## 2021-10-03 MED ORDER — ONDANSETRON HCL 4 MG/2ML IJ SOLN
4.0000 mg | Freq: Four times a day (QID) | INTRAMUSCULAR | Status: DC | PRN
Start: 2021-10-03 — End: 2021-10-03

## 2021-10-03 MED ORDER — MIDAZOLAM HCL 2 MG/2ML IJ SOLN
INTRAMUSCULAR | Status: AC
  Filled 2021-10-03: qty 2

## 2021-10-03 MED ORDER — PROPOFOL 10 MG/ML IV BOLUS
INTRAVENOUS | Status: DC | PRN
  Administered 2021-10-03: 100 mg via INTRAVENOUS

## 2021-10-03 MED ORDER — SODIUM CHLORIDE 0.9 % IV SOLN: INTRAVENOUS | Status: DC | PRN

## 2021-10-03 MED ORDER — PHENYLEPHRINE 80 MCG/ML (10ML) SYRINGE FOR IV PUSH (FOR BLOOD PRESSURE SUPPORT)
PREFILLED_SYRINGE | INTRAVENOUS | Status: AC
  Filled 2021-10-03: qty 20

## 2021-10-03 MED ORDER — FENTANYL CITRATE (PF) 100 MCG/2ML IJ SOLN
INTRAMUSCULAR | Status: AC
  Administered 2021-10-03: 25 ug via INTRAVENOUS
  Filled 2021-10-03: qty 2

## 2021-10-03 MED ORDER — SUCCINYLCHOLINE CHLORIDE 200 MG/10ML IV SOSY
PREFILLED_SYRINGE | INTRAVENOUS | Status: DC | PRN
  Administered 2021-10-03: 200 mg via INTRAVENOUS

## 2021-10-03 MED ORDER — LIDOCAINE HCL (CARDIAC) PF 100 MG/5ML IV SOSY
PREFILLED_SYRINGE | INTRAVENOUS | Status: DC | PRN
  Administered 2021-10-03: 100 mg via INTRAVENOUS

## 2021-10-03 MED ORDER — FENTANYL CITRATE (PF) 100 MCG/2ML IJ SOLN
INTRAMUSCULAR | Status: AC
  Filled 2021-10-03: qty 2

## 2021-10-03 MED ORDER — BUPIVACAINE LIPOSOME 1.3 % IJ SUSP
INTRAMUSCULAR | Status: DC | PRN
  Administered 2021-10-03: 100 mL

## 2021-10-03 MED ORDER — PHENYLEPHRINE HCL-NACL 20-0.9 MG/250ML-% IV SOLN
INTRAVENOUS | Status: DC | PRN
  Administered 2021-10-03: 30 ug/min via INTRAVENOUS

## 2021-10-03 MED ORDER — FENTANYL CITRATE (PF) 100 MCG/2ML IJ SOLN
INTRAMUSCULAR | Status: DC | PRN
Start: 2021-10-03 — End: 2021-10-03
  Administered 2021-10-03 (×3): 25 ug via INTRAVENOUS

## 2021-10-03 MED ORDER — PHENYLEPHRINE HCL (PRESSORS) 10 MG/ML IV SOLN
INTRAVENOUS | Status: DC | PRN
  Administered 2021-10-03: 80 ug via INTRAVENOUS
  Administered 2021-10-03: 160 ug via INTRAVENOUS
  Administered 2021-10-03: 80 ug via INTRAVENOUS
  Administered 2021-10-03 (×6): 160 ug via INTRAVENOUS

## 2021-10-03 MED ORDER — OXYCODONE HCL 5 MG PO TABS: 5.0000 mg | ORAL_TABLET | Freq: Once | ORAL | Status: DC | PRN

## 2021-10-03 MED ORDER — SODIUM CHLORIDE (PF) 0.9 % IJ SOLN
INTRAMUSCULAR | Status: AC
  Filled 2021-10-03: qty 50

## 2021-10-03 MED ORDER — SUGAMMADEX SODIUM 500 MG/5ML IV SOLN
INTRAVENOUS | Status: DC | PRN
  Administered 2021-10-03: 400 mg via INTRAVENOUS

## 2021-10-03 MED ORDER — PROPOFOL 10 MG/ML IV BOLUS
INTRAVENOUS | Status: AC
  Filled 2021-10-03: qty 20

## 2021-10-03 MED ORDER — ACETAMINOPHEN 10 MG/ML IV SOLN
INTRAVENOUS | Status: DC | PRN
  Administered 2021-10-03: 1000 mg via INTRAVENOUS

## 2021-10-03 MED ORDER — METOPROLOL TARTRATE 5 MG/5ML IV SOLN
5.0000 mg | Freq: Four times a day (QID) | INTRAVENOUS | Status: DC
  Administered 2021-10-03 – 2021-10-06 (×11): 5 mg via INTRAVENOUS
  Filled 2021-10-03 (×11): qty 5

## 2021-10-03 MED ORDER — HYDROMORPHONE HCL 1 MG/ML IJ SOLN
1.0000 mg | Freq: Once | INTRAMUSCULAR | Status: AC | PRN
  Administered 2021-10-03: 1 mg via INTRAVENOUS
  Filled 2021-10-03: qty 1

## 2021-10-03 MED ORDER — HYDROMORPHONE HCL 1 MG/ML IJ SOLN
1.0000 mg | INTRAMUSCULAR | Status: DC | PRN
  Administered 2021-10-03 – 2021-10-04 (×2): 1 mg via INTRAVENOUS
  Filled 2021-10-03 (×2): qty 1

## 2021-10-03 MED ORDER — BUPIVACAINE LIPOSOME 1.3 % IJ SUSP
INTRAMUSCULAR | Status: AC
Start: 2021-10-03 — End: ?
  Filled 2021-10-03: qty 20

## 2021-10-03 MED ORDER — FUROSEMIDE 10 MG/ML IJ SOLN
40.0000 mg | Freq: Two times a day (BID) | INTRAMUSCULAR | Status: AC
  Administered 2021-10-03 – 2021-10-13 (×20): 40 mg via INTRAVENOUS
  Filled 2021-10-03 (×21): qty 4

## 2021-10-03 MED ORDER — PHENYLEPHRINE HCL-NACL 20-0.9 MG/250ML-% IV SOLN
INTRAVENOUS | Status: AC
  Filled 2021-10-03: qty 250

## 2021-10-03 SURGICAL SUPPLY — 55 items
BAG COUNTER SPONGE SURGICOUNT (BAG) ×2 IMPLANT
BLADE SAGITTAL WIDE XTHICK NO (BLADE) ×2 IMPLANT
BLADE SURG SZ10 CARB STEEL (BLADE) ×2 IMPLANT
BNDG COHESIVE 4X5 TAN ST LF (GAUZE/BANDAGES/DRESSINGS) ×2 IMPLANT
BNDG ELASTIC 4X5.8 VLCR NS LF (GAUZE/BANDAGES/DRESSINGS) ×2 IMPLANT
BNDG GAUZE ELAST 4 BULKY (GAUZE/BANDAGES/DRESSINGS) ×2 IMPLANT
BRUSH SCRUB EZ  4% CHG (MISCELLANEOUS) ×1
BRUSH SCRUB EZ 4% CHG (MISCELLANEOUS) ×1 IMPLANT
CANISTER WOUND CARE 500ML ATS (WOUND CARE) IMPLANT
CHLORAPREP W/TINT 26 (MISCELLANEOUS) ×2 IMPLANT
DERMABOND ADVANCED (GAUZE/BANDAGES/DRESSINGS) ×2
DERMABOND ADVANCED .7 DNX12 (GAUZE/BANDAGES/DRESSINGS) ×2 IMPLANT
DRAPE INCISE IOBAN 66X45 STRL (DRAPES) ×2 IMPLANT
DRSG GAUZE FLUFF 36X18 (GAUZE/BANDAGES/DRESSINGS) ×2 IMPLANT
DRSG VAC ATS MED SENSATRAC (GAUZE/BANDAGES/DRESSINGS) IMPLANT
ELECT CAUTERY BLADE 6.4 (BLADE) ×2 IMPLANT
ELECT REM PT RETURN 9FT ADLT (ELECTROSURGICAL) ×2
ELECTRODE REM PT RTRN 9FT ADLT (ELECTROSURGICAL) ×1 IMPLANT
GLOVE BIO SURGEON STRL SZ7 (GLOVE) ×2 IMPLANT
GLOVE SURG SYN 8.0 (GLOVE) ×2 IMPLANT
GLOVE SURG SYN 8.0 PF PI (GLOVE) ×1 IMPLANT
GLOVE SURG UNDER LTX SZ7.5 (GLOVE) ×2 IMPLANT
GOWN STRL REUS W/ TWL LRG LVL3 (GOWN DISPOSABLE) ×1 IMPLANT
GOWN STRL REUS W/ TWL XL LVL3 (GOWN DISPOSABLE) ×2 IMPLANT
GOWN STRL REUS W/TWL LRG LVL3 (GOWN DISPOSABLE) ×1
GOWN STRL REUS W/TWL XL LVL3 (GOWN DISPOSABLE) ×2
HANDLE YANKAUER SUCT BULB TIP (MISCELLANEOUS) ×2 IMPLANT
KIT SUTURE REMOVAL HAMOT (SET/KITS/TRAYS/PACK) ×1 IMPLANT
KIT TURNOVER KIT A (KITS) ×2 IMPLANT
LABEL OR SOLS (LABEL) ×2 IMPLANT
MANIFOLD NEPTUNE II (INSTRUMENTS) ×2 IMPLANT
NS IRRIG 500ML POUR BTL (IV SOLUTION) ×2 IMPLANT
PACK EXTREMITY ARMC (MISCELLANEOUS) ×2 IMPLANT
PAD ABD DERMACEA PRESS 5X9 (GAUZE/BANDAGES/DRESSINGS) ×2 IMPLANT
PAD PREP 24X41 OB/GYN DISP (PERSONAL CARE ITEMS) ×2 IMPLANT
SPONGE T-LAP 18X18 ~~LOC~~+RFID (SPONGE) ×5 IMPLANT
STAPLER SKIN PROX 35W (STAPLE) ×1 IMPLANT
STOCKINETTE M/LG 89821 (MISCELLANEOUS) ×2 IMPLANT
SUT MNCRL 4-0 (SUTURE) ×2
SUT MNCRL 4-0 27XMFL (SUTURE) ×2
SUT SILK 2 0 (SUTURE) ×1
SUT SILK 2-0 18XBRD TIE 12 (SUTURE) ×1 IMPLANT
SUT SILK 3 0 (SUTURE) ×2
SUT SILK 3-0 18XBRD TIE 12 (SUTURE) ×1 IMPLANT
SUT VIC AB 0 CT1 36 (SUTURE) ×9 IMPLANT
SUT VIC AB 3-0 SH 27 (SUTURE) ×2
SUT VIC AB 3-0 SH 27X BRD (SUTURE) ×2 IMPLANT
SUT VICRYL PLUS ABS 0 54 (SUTURE) ×2 IMPLANT
SUT VICRYL+ 3-0 36IN CT-1 (SUTURE) ×6 IMPLANT
SUTURE MNCRL 4-0 27XMF (SUTURE) ×1 IMPLANT
SYR 20ML LL LF (SYRINGE) ×4 IMPLANT
TAPE UMBILICAL 1/8 X36 TWILL (MISCELLANEOUS) ×2 IMPLANT
TOWEL OR 17X26 4PK STRL BLUE (TOWEL DISPOSABLE) ×5 IMPLANT
WATER STERILE IRR 1000ML POUR (IV SOLUTION) ×1 IMPLANT
WATER STERILE IRR 500ML POUR (IV SOLUTION) ×2 IMPLANT

## 2021-10-03 NOTE — Anesthesia Postprocedure Evaluation (Signed)
Anesthesia Post Note  Patient: [name].  Procedure(s) Performed: AMPUTATION BELOW KNEE (Left: Knee)  Patient location during evaluation: PACU Anesthesia Type: General Level of consciousness: awake Pain management: pain level controlled Vital Signs Assessment: post-procedure vital signs reviewed and stable Respiratory status: spontaneous breathing (BiPAP) Cardiovascular status: blood pressure returned to baseline and stable Postop Assessment: no apparent nausea or vomiting Anesthetic complications: no Comments: Transferring patient to stepdown unit   No notable events documented.   Last Vitals:  Vitals:   10/03/21 1215 10/03/21 1230  BP:  112/75  Pulse:  (!) 104  Resp: 10 10  Temp:    SpO2:  92%    Last Pain:  Vitals:   10/03/21 1215  TempSrc:   PainSc: 6                  Precious Haws Elisheba Mcdonnell

## 2021-10-03 NOTE — Op Note (Signed)
OPERATIVE NOTE   PROCEDURE: Left below-the-knee amputation  PRE-OPERATIVE DIAGNOSIS: Left osteomyelitis of the calcaneus with gangrene  POST-OPERATIVE DIAGNOSIS: same as above  SURGEON: Katha Cabal, MD  ASSISTANT(S): None  ANESTHESIA: general  ESTIMATED BLOOD LOSS: 1700 cc  FINDING(S): Massive leg with profound venous hypertension and severe skin changes consistent with venous stasis dermatitis.     SPECIMEN(S):  Left below-the-knee amputation  INDICATIONS:   [name]. is a 66 y.o. male who presents with left leg gangrene.  The patient is scheduled for a left below-the-knee amputation.  I discussed in depth with the patient the risks, benefits, and alternatives to this procedure.  The patient is aware that the risk of this operation included but are not limited to:  bleeding, infection, myocardial infarction, stroke, death, failure to heal amputation wound, and possible need for more proximal amputation.  The patient is aware of the risks and agrees proceed forward with the procedure.  DESCRIPTION:  After full informed written consent was obtained from the patient, the patient was brought back to the operating room, and placed supine upon the operating table.  Prior to induction, the patient received IV antibiotics.  The patient was then prepped and draped in the standard fashion for a below-the-knee amputation.  After obtaining adequate anesthesia, the patient was prepped and draped in the standard fashion for a left below-the-knee amputation.  I marked out the anterior incision two finger breadths below the tibial tuberosity and then the marked out a posterior flap that was one third of the circumference of the calf in length.   I made the incisions for these flaps, and then dissected through the subcutaneous tissue, fascia, and muscle anteriorly.  I elevated  the periosteal tissue superiorly so that the tibia was about 3-4 cm shorter than the anterior skin flap.   I then transected the tibia with a power saw and then took a wedge off the tibia anteriorly with the power saw.  Then I smoothed out the rough edges.  In a similar fashion, I cut back the fibula about two centimeters higher than the level of the tibia with a bone cutter.  I put a bone hook into the distal tibia and then used a large amputation knife to sharply develop a tissue plane through the muscle along the fibula.  In such fashion, the posterior flap was developed.  At this point, the specimen was passed off the field as the below-the-knee amputation.  At this point, I clamped all visibly bleeding arteries and veins using a combination of suture ligation with Silk suture and electrocautery.  Bleeding continued to be controlled with electrocautery and suture ligature.  The stump was washed off with sterile normal saline and no further active bleeding was noted.  I reapproximated the anterior and posterior fascia  with interrupted stitches of 0 Vicryl.  This was completed along the entire length of anterior and posterior fascia until there were no more loose space in the fascial line. I then placed a layer of 2-0 Vicryl sutures in the subcutaneous tissue. The skin was then  reapproximated with staples.  The stump was washed off and dried.  The incision was dressed with Xeroform and  then fluffs were applied.  Kerlix was wrapped around the leg and then gently an ACE wrap was applied.    COMPLICATIONS: none  CONDITION: stable   [name]  10/03/2021, 11:26 AM    This note was created with Dragon Medical transcription system. Any  errors in dictation are purely unintentional.

## 2021-10-03 NOTE — Progress Notes (Signed)
ID Pt had BKA today He is sedated and sleeping Has BIPAP Wife at bed side  BP 132/90 (BP Location: Right Arm)   Pulse (!) 107   Temp 97.9 F (36.6 C)   Resp 16   Ht '6\' 4"'$  (1.93 m)   Wt (!) 192.8 kg   SpO2 100%   BMI 51.73 kg/m   BiPAP Hs-tachycardia Left bka- surgical dressing  Labs    Latest Ref Rng & Units 10/03/2021    3:03 AM 10/02/2021    3:30 AM 10/01/2021    5:39 AM  CBC  WBC 4.0 - 10.5 K/uL 5.4   5.0   5.7    Hemoglobin 13.0 - 17.0 g/dL 11.6   10.9   11.5    Hematocrit 39.0 - 52.0 % 39.6   37.1   39.5    Platelets 150 - 400 K/uL 232   237   267         Latest Ref Rng & Units 10/03/2021    3:03 AM 10/02/2021    3:30 AM 10/01/2021    5:39 AM  CMP  Glucose 70 - 99 mg/dL 142   158   232    BUN 8 - 23 mg/dL 35   34   31    Creatinine 0.61 - 1.24 mg/dL 1.00   1.03   1.03    Sodium 135 - 145 mmol/L 139   138   136    Potassium 3.5 - 5.1 mmol/L 4.7   4.5   4.4    Chloride 98 - 111 mmol/L 100   101   100    CO2 22 - 32 mmol/L 33   30   30    Calcium 8.9 - 10.3 mg/dL 8.2   7.9   8.4         Impression/recommendation MRSA bacteremia secondary to left heel ulcer with underlying osteomyelitis. Patient is currently on vancomycin, ceftriaxone and Flagyl.  He underwent BKA today.  We will switch vancomycin to daptomycin.  And discontinue ceftriaxone and Flagyl  Encephalopathy.  Multifactorial but predominantly due to CO2 narcosis.  Currently on BiPAP.  Severe venous edema of both legs  AKI resolved  Paroxysmal A-fib with atrial flutter.  On amiodarone metoprolol and Eliquis  Coronary artery disease status post CABG  Diastolic CHF started on furosemide  Discussed the management with his wife at bedside

## 2021-10-03 NOTE — Anesthesia Preprocedure Evaluation (Addendum)
Anesthesia Evaluation  Patient identified by MRN, date of birth, ID band Patient awake and Patient confused    Reviewed: Allergy & Precautions, NPO status , Patient's Chart, lab work & pertinent test results  History of Anesthesia Complications Negative for: history of anesthetic complications  Airway Mallampati: III  TM Distance: >3 FB Neck ROM: full    Dental  (+) Chipped, Poor Dentition, Missing   Pulmonary asthma , sleep apnea , COPD,  COPD inhaler and oxygen dependent, former smoker,    Pulmonary exam normal        Cardiovascular Exercise Tolerance: Good hypertension, + CAD, + Past MI, + Cardiac Stents, + Peripheral Vascular Disease and +CHF  Normal cardiovascular exam     Neuro/Psych PSYCHIATRIC DISORDERS  Neuromuscular disease    GI/Hepatic negative GI ROS, Neg liver ROS,   Endo/Other  negative endocrine ROSdiabetes  Renal/GU Renal disease     Musculoskeletal   Abdominal   Peds  Hematology negative hematology ROS (+)   Anesthesia Other Findings Past Medical History: No date: Arrhythmia     Comment:  atrial fibrillation No date: Asthma No date: CHF (congestive heart failure) (HCC) No date: COPD (chronic obstructive pulmonary disease) (HCC) No date: Coronary artery disease No date: Depression No date: Diabetes mellitus without complication (HCC) No date: Gout No date: History anabolic steroid use No date: Hyperlipidemia No date: Hypertension No date: Hypogonadism in male No date: Morbid obesity ([place]) No date: Myocardial infarction ([place]) No date: Peripheral vascular disease ([place]) No date: Perirectal abscess No date: Pleurisy No date: Sleep apnea     Comment:  CPAP at night, no oxygen No date: Varicella  Past Surgical History: No date: ABDOMINAL AORTIC ANEURYSM REPAIR 01/10/2021: ACHILLES TENDON SURGERY; Left     Comment:  Procedure: ACHILLES LENGTHENING/KIDNER;  Surgeon: Caroline More, DPM;  Location: [facility];  Service: Podiatry;                Laterality: Left; 02/10/2016: AMPUTATION TOE; Right     Comment:  Procedure: AMPUTATION TOE 3RD TOE;  Surgeon: Samara Deist, DPM;  Location: [facility];  Service: Podiatry;                Laterality: Right; 02/24/2020: AMPUTATION TOE; Left     Comment:  Procedure: AMPUTATION TOE;  Surgeon: Caroline More, DPM;              Location: [facility];  Service: Podiatry;  Laterality:               Left; 25/11/5275: APPLICATION OF WOUND VAC; Left     Comment:  Procedure: APPLICATION OF WOUND VAC;  Surgeon: Caroline More, DPM;  Location: [facility];  Service: Podiatry;                Laterality: Left; 11/18/2015: COLONOSCOPY WITH PROPOFOL; [name]     Comment:  Procedure: COLONOSCOPY WITH PROPOFOL;  Surgeon: Manya Silvas, MD;  Location: [facility];  Service:               Endoscopy;  Laterality: [name]; No date: CORONARY ARTERY BYPASS GRAFT 02/02/2020: CORONARY STENT INTERVENTION; [name]     Comment:  Procedure: CORONARY STENT INTERVENTION;  Surgeon:               Isaias Cowman, MD;  Location: [place] [facility]               LAB;  Service: Cardiovascular;  Laterality: [name]; 08/07/2021: INCISION AND DRAINAGE; Left     Comment:  Procedure: INCISION AND DRAINAGE-Partial Calcanectomy;                Surgeon: Caroline More, DPM;  Location: [facility];                Service: Podiatry;  Laterality: Left; 02/29/2020: IRRIGATION AND DEBRIDEMENT FOOT; Left     Comment:  Procedure: IRRIGATION AND DEBRIDEMENT FOOT;  Surgeon:               Caroline More, DPM;  Location: [facility];  Service:               Podiatry;  Laterality: Left; 02/24/2020: IRRIGATION AND DEBRIDEMENT FOOT; Left     Comment:  Procedure: IRRIGATION AND DEBRIDEMENT FOOT;  Surgeon:               Caroline More, DPM;  Location: [facility];  Service:               Podiatry;  Laterality: Left; No date: KNEE ARTHROSCOPY 02/02/2020: LEFT HEART  CATH AND CORS/GRAFTS ANGIOGRAPHY; [name]     Comment:  Procedure: LEFT HEART CATH AND CORS/GRAFTS ANGIOGRAPHY;               Surgeon: Teodoro Spray, MD;  Location: [place] [facility]              [facility];  Service: Cardiovascular;  Laterality: [name]; 02/25/2020: LOWER EXTREMITY ANGIOGRAPHY; Left     Comment:  Procedure: Lower Extremity Angiography;  Surgeon: Algernon Huxley, MD;  Location: [place] [facility];  Service:               Cardiovascular;  Laterality: Left; 01/04/2021: LOWER EXTREMITY ANGIOGRAPHY; Left     Comment:  Procedure: LOWER EXTREMITY ANGIOGRAPHY;  Surgeon: Algernon Huxley, MD;  Location: [place] [facility];  Service:               Cardiovascular;  Laterality: Left; 01/10/2021: METATARSAL HEAD EXCISION; Left     Comment:  Procedure: METATARSAL HEAD EXCISION - LEFT 5th;                Surgeon: Caroline More, DPM;  Location: [facility];                Service: Podiatry;  Laterality: Left; 01/24/2016: PERIPHERAL VASCULAR CATHETERIZATION; Right     Comment:  Procedure: Lower Extremity Angiography;  Surgeon:               Katha Cabal, MD;  Location: [place] [facility];                Service: Cardiovascular;  Laterality: Right; 01/25/2016: PERIPHERAL VASCULAR CATHETERIZATION; Right     Comment:  Procedure: Lower Extremity Angiography;  Surgeon:               Katha Cabal, MD;  Location: [place] [facility];                Service: Cardiovascular;  Laterality: Right; No date:  TOE AMPUTATION No date: TONSILLECTOMY  BMI    Body Mass Index: 51.73 kg/m      Reproductive/Obstetrics negative OB ROS                            Anesthesia Physical Anesthesia Plan  ASA: 4  Anesthesia Plan: General ETT   Post-op Pain Management:    Induction: Intravenous  PONV Risk Score and Plan: Ondansetron, Dexamethasone, Midazolam and Treatment may vary due to age or medical condition  Airway Management Planned: Oral ETT  Additional  Equipment:   Intra-op Plan:   Post-operative Plan: Extubation in OR and Possible Post-op intubation/ventilation  Informed Consent: I have reviewed the patients History and Physical, chart, labs and discussed the procedure including the risks, benefits and alternatives for the proposed anesthesia with the patient or authorized representative who has indicated his/her understanding and acceptance.     Dental Advisory Given  Plan Discussed with: Anesthesiologist, CRNA and Surgeon  Anesthesia Plan Comments: (Patient and wife consented for risks of anesthesia including but not limited to:  - adverse reactions to medications - damage to eyes, teeth, lips or other oral mucosa - nerve damage due to positioning  - sore throat or hoarseness - Damage to heart, brain, nerves, lungs, other parts of body or loss of life  They voiced understanding.)        Anesthesia Quick Evaluation

## 2021-10-03 NOTE — Consult Note (Signed)
ANTICOAGULATION CONSULT NOTE  Pharmacy Consult for IV Heparin Indication: atrial fibrillation  Patient Measurements: Height: '6\' 4"'$  (193 cm) Weight: (!) 184.7 kg (407 lb 3 oz) IBW/kg (Calculated) : 86.8 Heparin Dosing Weight: 130.7 kg  Labs: Recent Labs    10/01/21 0539 10/01/21 1231 10/02/21 0330 10/02/21 1251 10/02/21 1343 10/02/21 2119 10/03/21 0303  HGB 11.5*  --  10.9*  --   --   --  11.6*  HCT 39.5  --  37.1*  --   --   --  39.6  PLT 267  --  237  --   --   --  232  APTT  --    < > 64*  --  51* 70* 35  HEPARINUNFRC  --   --  0.31 0.43  --   --  <0.10*  CREATININE 1.03  --  1.03  --   --   --  1.00  CKTOTAL  --   --   --  32*  --   --   --    < > = values in this interval not displayed.     Estimated Creatinine Clearance: 129.5 mL/min (by C-G formula based on SCr of 1 mg/dL).   Medical History: Past Medical History:  Diagnosis Date   Arrhythmia    atrial fibrillation   Asthma    CHF (congestive heart failure) (HCC)    COPD (chronic obstructive pulmonary disease) (HCC)    Coronary artery disease    Depression    Diabetes mellitus without complication (HCC)    Gout    History anabolic steroid use    Hyperlipidemia    Hypertension    Hypogonadism in male    Morbid obesity ([place])    Myocardial infarction ([place])    Peripheral vascular disease ([place])    Perirectal abscess    Pleurisy    Sleep apnea    CPAP at night, no oxygen   Varicella     Medications:  Apixaban 5 mg BID (last dose 6/3 at 0933)   Assessment: Patient is a 66 y/o M with medical history as above and including Afib on apixaban who is admitted with left foot ulcer / left foot OM. Vascular surgery following and plan is for L BKA vs AKA pending scheduling. Apixaban is on hold for impending procedure. Pharmacy consulted to initiate and manage heparin infusion peri-operatively for Afib.   CBC notable for stable anemia. Baseline aPTT pending. Baseline INR 1.3.   Date Time aPTT /  HL Rate/comment 6/4 2055 66  2100 un/hr 6/5 0330 64 / 0.31  subthera/therapeutic 6/5 1251 51/0.43 Subthera/Thera-  inc from 2250 u/hr to 2300 u/hr 6/5       2119   70                    Therapeutic @ 2300 units/hr.  6/6       0303   35 / < 0.1         Heparin gtt d/c'd @ 0000  Goal of Therapy:  Heparin level 0.3-0.7 units/ml aPTT 66 - 102 seconds Monitor platelets by anticoagulation protocol: Yes   Plan:  6/5 @ 2119:   aPTT = 70 , therapeutic X 1 @ 2300 units/hr Left leg amputation planned for 6/6 AM,  Heparin drip d/c'd on 6/6 @ 0000.  Will recheck aPTT and HL on 6/6 @ 0300.   6/6 @ 0303:  HL = < 0.1 ,  aPTT = 35 - Heparin  drip d/c'd @ 0000.    HL is < 0.1 so should be ok to use HL to dose heparin if MD decides to restart heparin drip.  - Will need to f/u plans for anticoag after surgery.    Alexandria D Clinical Pharmacist 10/03/2021

## 2021-10-03 NOTE — Progress Notes (Signed)
Progress Note   Patient: [name]. KXF:[id number] DOB: [birth year] DOA: 09/28/2021     5 DOS: the patient was seen and examined on 10/03/2021   Brief hospital course: Tomasz A Parady Brooke Bonito. is a 66 y.o. male with medical history significant for chronic diastolic dysfunction CHF with last known LVEF of 50 to 55%, hypertension, coronary artery disease, depression, COPD, A-fib, morbid obesity (BMI 49), obstructive sleep apnea, status post recent wound debridement and partial calcanectomy (05/16) currently on oral antibiotic therapy after completion of IV who presents to the ER for evaluation of bilateral lower extremity swelling as well as increased redness and swelling involving the left lower extremity mostly over the heel. MRI of the foot showed left foot osteomyelitis, placed on vancomycin and Zosyn. Patient also has evidence of acute on chronic diastolic congestive heart failure.  Started IV Lasix. Patient also had a blood culture positive for MRSA secondary to osteomyelitis.  Foot amputation performed on 6/6.  Assessment and Plan: Left foot ulcer. Left foot osteomyelitis. MRSA bacteremia. Sepsis ruled out. MRI of the foot showed a severe osteomyelitis.  Currently pending left foot amputation.  Blood culture 1/4 positive for MRSA,  Patient is a followed by ID, status post foot amputation 6/6. We will continue Rocephin and vancomycin under guidance of ID.  Repeat blood culture pending.  Acute metabolic encephalopathy. This is multifactorial, secondary to infection, sleep deprivation, CO2 retention. Patient is status post amputation today, continue Seroquel to improve sleep.  BiPAP to improve CO2 retention.  Acute on chronic diastolic congestive heart failure. Acute respiratory failure with hypoxemia and hypercapnia. POA, ruled in.  Acute kidney injury secondary to diuretics. Morbid obesity. BMI 48.69 OSA Patient has been treated with IV Lasix, currently patient still has  significant volume overload postop.  Increase IV Lasix to twice a day. Patient appears to have chronic CO2 retention from OSA, patient was intubated before surgery.  Postoperatively, patient developed worsening CO2 retention with respiratory acidosis.  We will continue BiPAP for today and at night. Condition is critical, will follow closely in the progressive unit.   Uncontrolled type 2 diabetes with hyperglycemia. Continue current regimen.   Moderate protein calorie malnutrition. Continue protein supplement.   Paroxysmal atrial fibrillation with rapid ventricle response Anticoagulation on hold for surgery.  May consider restart tomorrow.        Subjective:  Patient still has significant confusion, currently on BiPAP.  No short of breath.  Physical Exam: Vitals:   10/03/21 1321 10/03/21 1329 10/03/21 1330 10/03/21 1345  BP:  113/77 113/77 109/75  Pulse: (!) 106 (!) 106 (!) 106 (!) 106  Resp: '16 10 10 10  '$ Temp:  97.8 F (36.6 C)    TempSrc:      SpO2: 97% 96% 96% 97%  Weight:      Height:       General exam: Appears calm and comfortable  Respiratory system: Crackles in the base. Respiratory effort normal. Cardiovascular system: Regular. No JVD, murmurs, rubs, gallops or clicks. No pedal edema. Gastrointestinal system: Abdomen is nondistended, soft and nontender. No organomegaly or masses felt. Normal bowel sounds heard. Central nervous system: Drowsy and oriented x2. No focal neurological deficits. Extremities: Symmetric 5 x 5 power. Skin: No rashes, lesions or ulcers   Data Reviewed:  All results reviewed.  Family Communication: Wife updated  Disposition: Status is: Inpatient Remains inpatient appropriate because: Severity of disease, requiring BiPAP.  IV treatment.  Planned Discharge Destination: Skilled nursing facility    Time  spent: 55 minutes  Author: Sharen Hones, MD 10/03/2021 2:05 PM  For on call review [email or web address].

## 2021-10-03 NOTE — Addendum Note (Signed)
Addendum  created 10/03/21 1251 by Tollie Eth, CRNA   Intraprocedure Flowsheets edited

## 2021-10-03 NOTE — Anesthesia Procedure Notes (Signed)
Procedure Name: Intubation Date/Time: 10/03/2021 7:45 AM Performed by: Tollie Eth, CRNA Pre-anesthesia Checklist: Patient identified, Patient being monitored, Timeout performed, Emergency Drugs available and Suction available Patient Re-evaluated:Patient Re-evaluated prior to induction Oxygen Delivery Method: Circle system utilized Preoxygenation: Pre-oxygenation with 100% oxygen Induction Type: IV induction and Rapid sequence Laryngoscope Size: McGraph and 4 Grade View: Grade I Tube type: Oral Tube size: 7.5 mm Number of attempts: 1 Airway Equipment and Method: Stylet and Video-laryngoscopy Placement Confirmation: ETT inserted through vocal cords under direct vision, positive ETCO2 and breath sounds checked- equal and bilateral Secured at: 21 cm Tube secured with: Tape Dental Injury: Teeth and Oropharynx as per pre-operative assessment

## 2021-10-03 NOTE — Interval H&P Note (Signed)
History and Physical Interval Note:  10/03/2021 6:07 AM  [name].  has presented today for surgery, with the diagnosis of Osteomyelitis.  The various methods of treatment have been discussed with the patient and family. After consideration of risks, benefits and other options for treatment, the patient has consented to  Procedure(s): AMPUTATION BELOW KNEE (Left) as a surgical intervention.  The patient's history has been reviewed, patient examined, no change in status, stable for surgery.  I have reviewed the patient's chart and labs.  Questions were answered to the patient's satisfaction.     Hortencia Pilar

## 2021-10-03 NOTE — Transfer of Care (Signed)
Immediate Anesthesia Transfer of Care Note  Patient: [name].  Procedure(s) Performed: AMPUTATION BELOW KNEE (Left: Knee)  Patient Location: PACU  Anesthesia Type:General  Level of Consciousness: drowsy  Airway & Oxygen Therapy: Pt remains on BiPAP per anesthesia plan  Post-op Assessment: Report given to RN and Post -op Vital signs reviewed and stable  Post vital signs: Reviewed and stable  Last Vitals:  Vitals Value Taken Time  BP 125/83 10/03/21 1131  Temp 36.2 C 10/03/21 1131  Pulse 105 10/03/21 1138  Resp 0 10/03/21 1138  SpO2 91 % 10/03/21 1138  Vitals shown include unvalidated device data.  Last Pain:  Vitals:   10/03/21 0659  TempSrc: Temporal  PainSc: 3       Patients Stated Pain Goal: 0 (59/97/74 1423)  Complications: No notable events documented.

## 2021-10-03 NOTE — Progress Notes (Signed)
PT placed on BIPA 12/5 with a 4L O2 bleed (36%), tolerating well at this time. PT instructed to stay on BIPAP throughout the night, due to his prior non compliance. RN aware.

## 2021-10-03 NOTE — Progress Notes (Signed)
Anesthesia:  Dr. Amie Critchley aware of blood gas. Dr. Roosevelt Locks:  attending aware of blood gas:  leave patient on bipap and transfer to step down. Patient awake at intervals, opens eyes, nods head to simple questions.

## 2021-10-03 NOTE — Progress Notes (Signed)
Spoke to pt wife prior to moving patient to room closer to nurses station. Discussed pain management and have agreed current regimen has been effective.

## 2021-10-04 ENCOUNTER — Encounter: Payer: Self-pay | Admitting: Vascular Surgery

## 2021-10-04 ENCOUNTER — Telehealth (INDEPENDENT_AMBULATORY_CARE_PROVIDER_SITE_OTHER): Payer: Self-pay

## 2021-10-04 DIAGNOSIS — A4102 Sepsis due to Methicillin resistant Staphylococcus aureus: Secondary | ICD-10-CM | POA: Diagnosis not present

## 2021-10-04 DIAGNOSIS — J9601 Acute respiratory failure with hypoxia: Secondary | ICD-10-CM

## 2021-10-04 DIAGNOSIS — J9602 Acute respiratory failure with hypercapnia: Secondary | ICD-10-CM

## 2021-10-04 DIAGNOSIS — B9562 Methicillin resistant Staphylococcus aureus infection as the cause of diseases classified elsewhere: Secondary | ICD-10-CM | POA: Diagnosis not present

## 2021-10-04 DIAGNOSIS — I10 Essential (primary) hypertension: Secondary | ICD-10-CM

## 2021-10-04 DIAGNOSIS — Z4889 Encounter for other specified surgical aftercare: Secondary | ICD-10-CM

## 2021-10-04 DIAGNOSIS — E1165 Type 2 diabetes mellitus with hyperglycemia: Secondary | ICD-10-CM

## 2021-10-04 DIAGNOSIS — R7881 Bacteremia: Secondary | ICD-10-CM | POA: Diagnosis not present

## 2021-10-04 DIAGNOSIS — E440 Moderate protein-calorie malnutrition: Secondary | ICD-10-CM

## 2021-10-04 DIAGNOSIS — R410 Disorientation, unspecified: Secondary | ICD-10-CM | POA: Clinically undetermined

## 2021-10-04 DIAGNOSIS — R6520 Severe sepsis without septic shock: Secondary | ICD-10-CM

## 2021-10-04 DIAGNOSIS — I5033 Acute on chronic diastolic (congestive) heart failure: Secondary | ICD-10-CM | POA: Diagnosis not present

## 2021-10-04 DIAGNOSIS — J9621 Acute and chronic respiratory failure with hypoxia: Secondary | ICD-10-CM | POA: Diagnosis present

## 2021-10-04 DIAGNOSIS — M86672 Other chronic osteomyelitis, left ankle and foot: Secondary | ICD-10-CM | POA: Diagnosis not present

## 2021-10-04 DIAGNOSIS — Z794 Long term (current) use of insulin: Secondary | ICD-10-CM

## 2021-10-04 DIAGNOSIS — Z89512 Acquired absence of left leg below knee: Secondary | ICD-10-CM

## 2021-10-04 LAB — CBC
HCT: 33.1 % — ABNORMAL LOW (ref 39.0–52.0)
Hemoglobin: 9.7 g/dL — ABNORMAL LOW (ref 13.0–17.0)
MCH: 26.9 pg (ref 26.0–34.0)
MCHC: 29.3 g/dL — ABNORMAL LOW (ref 30.0–36.0)
MCV: 91.7 fL (ref 80.0–100.0)
Platelets: 236 10*3/uL (ref 150–400)
RBC: 3.61 MIL/uL — ABNORMAL LOW (ref 4.22–5.81)
RDW: 17.2 % — ABNORMAL HIGH (ref 11.5–15.5)
WBC: 9.5 10*3/uL (ref 4.0–10.5)
nRBC: 0 % (ref 0.0–0.2)

## 2021-10-04 LAB — BPAM RBC
Blood Product Expiration Date: 202306242359
Blood Product Expiration Date: 202306242359
ISSUE DATE / TIME: 202306060933
ISSUE DATE / TIME: 202306060933
Unit Type and Rh: 5100
Unit Type and Rh: 5100

## 2021-10-04 LAB — GLUCOSE, CAPILLARY
Glucose-Capillary: 142 mg/dL — ABNORMAL HIGH (ref 70–99)
Glucose-Capillary: 160 mg/dL — ABNORMAL HIGH (ref 70–99)
Glucose-Capillary: 219 mg/dL — ABNORMAL HIGH (ref 70–99)
Glucose-Capillary: 233 mg/dL — ABNORMAL HIGH (ref 70–99)

## 2021-10-04 LAB — TYPE AND SCREEN
ABO/RH(D): O NEG
Antibody Screen: NEGATIVE
Unit division: 0
Unit division: 0

## 2021-10-04 LAB — BASIC METABOLIC PANEL
Anion gap: 4 — ABNORMAL LOW (ref 5–15)
BUN: 34 mg/dL — ABNORMAL HIGH (ref 8–23)
CO2: 32 mmol/L (ref 22–32)
Calcium: 8 mg/dL — ABNORMAL LOW (ref 8.9–10.3)
Chloride: 106 mmol/L (ref 98–111)
Creatinine, Ser: 0.97 mg/dL (ref 0.61–1.24)
GFR, Estimated: >60 mL/min (ref >60)
Glucose, Bld: 154 mg/dL — ABNORMAL HIGH (ref 70–99)
Potassium: 4.9 mmol/L (ref 3.5–5.1)
Sodium: 142 mmol/L (ref 135–145)

## 2021-10-04 LAB — MAGNESIUM: Magnesium: 1.9 mg/dL (ref 1.7–2.4)

## 2021-10-04 MED ORDER — ENSURE MAX PROTEIN PO LIQD
11.0000 [oz_av] | Freq: Every day | ORAL | Status: DC
  Administered 2021-10-04 – 2021-10-15 (×11): 11 [oz_av] via ORAL
  Filled 2021-10-04: qty 330

## 2021-10-04 MED ORDER — HEPARIN (PORCINE) 25000 UT/250ML-% IV SOLN
3200.0000 [IU]/h | INTRAVENOUS | Status: DC
Start: 2021-10-04 — End: 2021-10-06
  Administered 2021-10-04: 2300 [IU]/h via INTRAVENOUS
  Administered 2021-10-05: 2600 [IU]/h via INTRAVENOUS
  Administered 2021-10-05: 2550 [IU]/h via INTRAVENOUS
  Administered 2021-10-06: 2900 [IU]/h via INTRAVENOUS
  Administered 2021-10-06: 3200 [IU]/h via INTRAVENOUS
  Filled 2021-10-04 (×5): qty 250

## 2021-10-04 MED ORDER — HALOPERIDOL LACTATE 5 MG/ML IJ SOLN: 1.0000 mg | Freq: Four times a day (QID) | INTRAMUSCULAR | Status: DC | PRN

## 2021-10-04 MED ORDER — MORPHINE SULFATE (PF) 2 MG/ML IV SOLN
2.0000 mg | INTRAVENOUS | Status: DC | PRN
  Administered 2021-10-04 – 2021-10-06 (×2): 2 mg via INTRAVENOUS
  Filled 2021-10-04 (×2): qty 1

## 2021-10-04 MED ORDER — HALOPERIDOL LACTATE 5 MG/ML IJ SOLN
0.5000 mg | INTRAMUSCULAR | Status: AC | PRN
  Administered 2021-10-04 (×2): 0.5 mg via INTRAVENOUS
  Filled 2021-10-04 (×2): qty 1

## 2021-10-04 MED ORDER — DILTIAZEM HCL ER COATED BEADS 180 MG PO CP24
360.0000 mg | ORAL_CAPSULE | Freq: Every day | ORAL | Status: DC
  Administered 2021-10-04 – 2021-10-07 (×4): 360 mg via ORAL
  Filled 2021-10-04 (×4): qty 2

## 2021-10-04 NOTE — Progress Notes (Signed)
Pt remains confused and pulling at lines. Mitts on both hands but has removed them multiple times. At this time on room at with O2 saturation of 92 as pt will not keep oxygen on.

## 2021-10-04 NOTE — Consult Note (Signed)
ANTICOAGULATION CONSULT NOTE  Pharmacy Consult for IV Heparin Indication: atrial fibrillation  Patient Measurements: Height: '6\' 4"'$  (193 cm) Weight: (!) 192.8 kg (425 lb) IBW/kg (Calculated) : 86.8 Heparin Dosing Weight: 130.7 kg  Labs: Recent Labs    10/02/21 0330 10/02/21 1251 10/02/21 1343 10/02/21 2119 10/03/21 0303 10/04/21 0621  HGB 10.9*  --   --   --  11.6* 9.7*  HCT 37.1*  --   --   --  39.6 33.1*  PLT 237  --   --   --  232 236  APTT 64*  --  51* 70* 35  --   HEPARINUNFRC 0.31 0.43  --   --  <0.10*  --   CREATININE 1.03  --   --   --  1.00 0.97  CKTOTAL  --  32*  --   --   --   --      Estimated Creatinine Clearance: 136.9 mL/min (by C-G formula based on SCr of 0.97 mg/dL).   Medical History: Past Medical History:  Diagnosis Date   Arrhythmia    atrial fibrillation   Asthma    CHF (congestive heart failure) (HCC)    COPD (chronic obstructive pulmonary disease) (HCC)    Coronary artery disease    Depression    Diabetes mellitus without complication (HCC)    Gout    History anabolic steroid use    Hyperlipidemia    Hypertension    Hypogonadism in male    Morbid obesity ([place])    Myocardial infarction ([place])    Peripheral vascular disease (HCC)    Perirectal abscess    Pleurisy    Sleep apnea    CPAP at night, no oxygen   Varicella   Heparin Dosing Weight: 130.7 kg  Medications:  Apixaban 5 mg BID (last dose 6/3 at 0933)   Assessment: Patient is a 66 y/o M with medical history as above and including Afib on apixaban who is admitted with left foot ulcer / left foot OM. Vascular surgery following and plan is for L BKA vs AKA pending scheduling. Apixaban is on hold for impending procedure. Pharmacy consulted to initiate and manage heparin infusion peri-operatively for Afib.   CBC notable for stable anemia. Baseline aPTT pending. Baseline INR 1.3.   Date Time aPTT / HL Rate/comment 6/4 2055 66  2100 un/hr 6/5 0330 64 / 0.31   subthera/therapeutic 6/5 1251 51/0.43 Subthera/Thera-  inc from 2250 u/hr to 2300 u/hr 6/5       2119   70                    Therapeutic @ 2300 units/hr.  6/6       0303   35 / < 0.1         Heparin gtt d/c'd @ 0000 --- 6/7  Goal of Therapy:  Heparin level 0.3-0.7 units/ml aPTT 66 - 102 seconds Monitor platelets by anticoagulation protocol: Yes   Plan:  Left leg amputation planned for 6/6 AM >> Will resume at previous therapeutic rate 6/07, but skip initial bolus per d/w Dr. Delana Meyer.  6/5 @ 2119: aPTT = 70  >> So last therapeutic @ 2300 units/hr 6/6 '@0303'$ : HL on was < 0.1 >> So no further DOAC interference. Titrate by HL alone.  Resume heparin rate at 2300 un/hr (no initial bolus; then per nomogram thereafter) Check heparin level in 6hrs after restart CTM CBC daily while heparin gtt active.  Shanon Brow Kerri Asche Clinical  Pharmacist 10/04/2021

## 2021-10-04 NOTE — Telephone Encounter (Signed)
Patient's spouse called the office stating that Dr. Delana Meyer has given orders for Heparin which was not being given to the patient who is still an in pt at [facility]. I advised that she should talk with the nursing station or charge nurse regarding this as we are the office and unable to make any changes. Patient's spouse got irate stating why can't the surgical doctor change this and as I was explaining again that she should speak with someone at the hospital she hung up.

## 2021-10-04 NOTE — Progress Notes (Signed)
Nutrition Follow-up  DOCUMENTATION CODES:   Morbid obesity, Non-severe (moderate) malnutrition in context of acute illness/injury  INTERVENTION:   -Continue Glucerna Shake po BID, each supplement provides 220 kcal and 10 grams of protein  -D/c Prosource Plus -Ensure Max po daily, each supplement provides 150 kcal and 30 grams of protein -Continue MVI with minerals daily -Continue 500 mg vitamin C BID -Continue 220 mg zinc sulfate x 14 days  NUTRITION DIAGNOSIS:   Moderate Malnutrition related to acute illness (L heel osteomyelitis) as evidenced by energy intake < 75% for > 7 days, mild fat depletion.  Ongoing  GOAL:   Patient will meet greater than or equal to 90% of their needs  Progressing   MONITOR:   PO intake, Supplement acceptance, Labs, Weight trends, I & O's  REASON FOR ASSESSMENT:   Consult Wound healing  ASSESSMENT:   Pt admitted with wound infection. Found to have osteomyelitis of L heel. PMH significant for uncontrolled G2XB, chronic diastolic dysfunction CHF, HTN, CAD, COPD, afib, morbid obesity, OSA, s/p recent wound debridement and partial calcanectomy (05/16).  6/6- s/p lt BKA  Reviewed I/O's: -497 ml x 24 hours and -3.6 L since admission  UOP: 2 L x 24 hours   Pt sleeping soundly on bi-pap at time of visit. No family at bedside.   Per RN notes, pt with increased agitation today.   Pt with good appetite. Noted meal completions 50-100%. Pt is drinking Glucerna supplements, but refusing Prosource.   Medications reviewed and include cardizem.   Labs reviewed: CBGS: 142-195   Diet Order:   Diet Order             DIET DYS 3 Room service appropriate? Yes; Fluid consistency: Thin  Diet effective now                   EDUCATION NEEDS:   Not appropriate for education at this time  Skin:  Skin Assessment: Skin Integrity Issues: Skin Integrity Issues:: Other (Comment), Incisions Wound Vac: - Incisions: s/p lt BKA Other: venous statsis  ulcer to rt pretibial, MASD to abdominal folds and groin  Last BM:  10/01/21  Height:   Ht Readings from Last 1 Encounters:  10/03/21 '6\' 4"'$  (1.93 m)    Weight:   Wt Readings from Last 1 Encounters:  10/03/21 (!) 192.8 kg    Ideal Body Weight:  91.8 kg  BMI:  Body mass index is 51.73 kg/m.  Estimated Nutritional Needs:   Kcal:  2100-2300  Protein:  120-140 grams  Fluid:  > 2 L    Loistine Chance, RD, LDN, [place] Registered Dietitian II Certified Diabetes Care and Education Specialist Please refer to [facility] for RD and/or RD on-call/weekend/after hours pager

## 2021-10-04 NOTE — Progress Notes (Signed)
[facility]  Daily Progress Note   Subjective  - 1 Day Post-Op  Patient is awake but fairly confused not oriented to person place or time.  Wife is a bit concerned.  He has been off his BiPAP all day.  At this point he is not complaining of any pain whatsoever in his left leg  Objective Vitals:   10/04/21 1155 10/04/21 1238 10/04/21 1611 10/04/21 1643  BP: 128/73 133/73 120/69   Pulse: (!) 115  (!) 114   Resp:  16    Temp:   98.4 F (36.9 C)   TempSrc:   Axillary   SpO2:   91% 93%  Weight:      Height:        Intake/Output Summary (Last 24 hours) at 10/04/2021 1758 Last data filed at 10/04/2021 1410 Gross per 24 hour  Intake 563.26 ml  Output 1200 ml  Net -636.74 ml    PULM  Normal effort , no use of accessory muscles CV  No JVD, RRR Abd      No distended, nontender VASC  dressing clean dry and intact no blood staining to speak of  Laboratory CBC    Component Value Date/Time   WBC 9.5 10/04/2021 0621   HGB 9.7 (L) 10/04/2021 0621   HGB 14.7 07/12/2011 0919   HCT 33.1 (L) 10/04/2021 0621   HCT 45.4 07/12/2011 0919   PLT 236 10/04/2021 0621   PLT 156 07/12/2011 0919    BMET    Component Value Date/Time   NA 142 10/04/2021 0621   NA 141 07/12/2011 0919   K 4.9 10/04/2021 0621   K 4.4 07/12/2011 0919   CL 106 10/04/2021 0621   CL 102 07/12/2011 0919   CO2 32 10/04/2021 0621   CO2 29 07/12/2011 0919   GLUCOSE 154 (H) 10/04/2021 0621   GLUCOSE 76 07/12/2011 0919   BUN 34 (H) 10/04/2021 0621   BUN 16 05/05/2013 1022   CREATININE 0.97 10/04/2021 0621   CREATININE 0.71 05/05/2013 1022   CALCIUM 8.0 (L) 10/04/2021 0621   CALCIUM 8.8 07/12/2011 0919   GFRNONAA >60 10/04/2021 0621   GFRNONAA >60 05/05/2013 1022   GFRAA >60 01/06/2020 1853   GFRAA >60 05/05/2013 1022    Assessment/Planning: POD #1 s/p left BKA  I have been on the phone with pharmacy and have reviewed his orders.  Given his current state I have discontinued his Haldol  as well as his Dilaudid.  The Dilaudid was used initially since he was requiring continuous BiPAP postop and therefore could not take oral medications were easily.  Thus, I a opted for the longer half-life.  However given the present circumstances I will discontinue the Dilaudid as I mentioned above and will order Percocet with a lower dose of morphine for breakthrough if needed.  A with respect to his heparin I have also communicated with pharmacy we will restart his heparin drip with no initial bolus at the previous therapeutic rate and then follow the nomogram thereafter.  My plan is to change his initial surgical dressing on Friday and if his stump looks good and there is no concern for any need for further surgery then we will begin Eliquis Friday afternoon/evening.   Hortencia Pilar  10/04/2021, 5:58 PM

## 2021-10-04 NOTE — Progress Notes (Signed)
OT Cancellation Note  Patient Details Name: [name]. MRN: [id number] DOB: 09/03/1955   Cancelled Treatment:    Reason Eval/Treat Not Completed: Other (comment). Per chart review and RN, pt remains on bipap this morning. He has had increased agitation and received haldol and dilaudid around 530. OT to continue to follow and will attempt re-evaluation when pt is able to actively participate.  Darleen Crocker, MS, OTR/L , CBIS ascom [phone]  10/04/21, 9:01 AM

## 2021-10-04 NOTE — Progress Notes (Signed)
Progress Note   Patient: [name]. CHE:[id number] DOB: [birth year] DOA: 09/28/2021     6 DOS: the patient was seen and examined on 10/04/2021   Brief hospital course: Raider A Lenis Brooke Bonito. is a 66 y.o. male with medical history significant for chronic diastolic dysfunction CHF with last known LVEF of 50 to 55%, hypertension, coronary artery disease, depression, COPD, A-fib, morbid obesity (BMI 49), obstructive sleep apnea, status post recent wound debridement and partial calcanectomy (05/16) currently on oral antibiotic therapy after completion of IV who presents to the ER for evaluation of bilateral lower extremity swelling as well as increased redness and swelling involving the left lower extremity mostly over the heel. MRI of the foot showed left foot osteomyelitis, placed on vancomycin and Zosyn. Patient also has evidence of acute on chronic diastolic congestive heart failure.  Started IV Lasix. Patient also had a blood culture positive for MRSA secondary to osteomyelitis.  Left below the amputation performed on 6/6.  Assessment and Plan: * Sepsis due to methicillin resistant Staphylococcus aureus (MRSA) ([place]) MRSA growing out of blood cultures on 09/28/2021.  Repeat blood cultures so far negative for 2 days.  Currently on daptomycin.  Source of infection likely osteomyelitis  Acute delirium Trial of Seroquel at night to get sleeping.  As needed Haldol.  Osteomyelitis [facility]) Patient has an ulcer over his left heel and is status post recent left heel partial calcanectomy. Status post left BKA on 10/03/2021. Patient on IV daptomycin.    Acute on chronic diastolic CHF (congestive heart failure) ([place]) Patient's last known LVEF is 55 to 60% from a 2D echocardiogram which was done 01/23 Continue Lasix IV.  Patient also on metoprolol.  Uncontrolled type 2 diabetes mellitus with hyperglycemia, with long-term current use of insulin (HCC) Last hemoglobin A1c in the chart of 10.2.  Continue  long-acting insulin and sliding scale insulin.  AF (paroxysmal atrial fibrillation) (HCC) Continue metoprolol, Cardizem and amiodarone for rate control.  Eliquis currently on hold postoperative day 1.  Obesity, Class III, BMI 40-49.9 (morbid obesity) ([place]) Patient will need a new weight postoperatively.  Essential hypertension, benign Continue metoprolol and diltiazem and Lasix  RLS (restless legs syndrome) Continue pramipexole  Acute on chronic respiratory failure with hypoxia and hypercapnia (HCC) Patient had CO2 retention and had BiPAP last night.  We will continue at night for at this point.        Subjective: Patient seen this morning while he was on BiPAP.  He seemed to answer questions appropriately.  He has had some periods of confusion during the hospital stay.  Physical Exam: Vitals:   10/04/21 1117 10/04/21 1152 10/04/21 1155 10/04/21 1238  BP:   128/73 133/73  Pulse:   (!) 115   Resp:  16  16  Temp:      TempSrc:      SpO2: 91%     Weight:      Height:       Physical Exam HENT:     Head: Normocephalic.     Mouth/Throat:     Pharynx: No oropharyngeal exudate.  Eyes:     General: Lids are normal.     Conjunctiva/sclera: Conjunctivae normal.  Cardiovascular:     Rate and Rhythm: Tachycardia present. Rhythm irregularly irregular.     Heart sounds: Normal heart sounds, S1 normal and S2 normal.  Pulmonary:     Breath sounds: No decreased breath sounds, wheezing, rhonchi or rales.  Abdominal:     Palpations: Abdomen  is soft.     Tenderness: There is no abdominal tenderness.  Musculoskeletal:     Right lower leg: Edema present.  Skin:    General: Skin is warm.     Comments: Left leg and pressure dressing.  Neurological:     Mental Status: He is alert.     Comments: Answers some simple questions.    Data Reviewed: Hemoglobin 9.7, CO2 34, BNP 527  Family Communication: Spoke with wife on the phone  Disposition: Status is: Inpatient Remains  inpatient appropriate because: Just have a BKA and being treated for MRSA sepsis  Planned Discharge Destination: Skilled nursing facility   Author: Loletha Grayer, MD 10/04/2021 4:12 PM  For on call review [email or web address].

## 2021-10-04 NOTE — Progress Notes (Signed)
PT Cancellation Note  Patient Details Name: [name]. MRN: [id number] DOB: July 11, 1955   Cancelled Treatment:    Reason Eval/Treat Not Completed: Other (comment): Per nursing patient confused and unable to follow commands/participate safely with PT services at this time.  Will attempt to see pt at a future date/time as medically appropriate.     Linus Salmons PT, DPT 10/04/21, 2:17 PM

## 2021-10-04 NOTE — Assessment & Plan Note (Addendum)
Requires BiPAP at night in order to to prevent CO2 narcosis and encephalopathy.   Last PCO2 76 today after being on BiPAP for few hours during the day. -- qualifies for NIV at rehab.  I stressed the importance to respiratory that he needs to be on the BiPAP especially all night to lower this PCO2.

## 2021-10-04 NOTE — Assessment & Plan Note (Addendum)
MRSA growing out of blood cultures on 09/28/2021.  Repeat blood cultures negative.  Continue daptomycin.  Source of infection likely osteomyelitis.  Will need 4 weeks total of antibiotics total.

## 2021-10-04 NOTE — Progress Notes (Signed)
[facility]  Patient ID: [name]. MRN: [id number] DOB/AGE: 08/28/1955 66 y.o.   Admit date: 09/28/2021 Referring Physician Dr. Francine Graven Primary Physician Dr. Felipa Furnace Primary Cardiologist Dr. Donnelly Wood Reason for Consultation acute on chronic HFpEF    HPI: [name]. is a [phone] with a PMH significant for CAD s/p CABG 2006 and PCI of RCA 01/2020, HFpEF (LVEF 50-55% 04/2021), A. fib / flutter on apixaban, amiodarone and digoxin, HTN, BPH, morbid obesity, poorly controlled type 2 diabetes,  PAD s/p left third toe amputation, chronic venous stasis on [place] boots, OSA on CPAP who presented to [facility] ED on 09/28/21 with increasing pain and swelling in his left leg, and imaging concerning for worsening osteomyelitis of his left foot. He has has onoging infections needing wound debridement in his left lower leg over the past several months. Cardiology is consulted for assistance with his heart failure.    Interval History:  -S/p left BKA 6/6, post procedurally complicated by worsening CO2 retention and respiratory acidosis, improved on BiPAP -delirious and agitated with staff overnight requiring Haldol  -Remains significantly volume overloaded, was getting increased volume with IV antibiotics -seen this afternoon with his lunch tray in front of him, awakens briefly to voice and is able to tell me nothing is bothering him, says yesterday "wasn't good"   Vitals:   10/04/21 1117 10/04/21 1152 10/04/21 1155 10/04/21 1238  BP:   128/73 133/73  Pulse:   (!) 115   Resp:  16  16  Temp:      TempSrc:      SpO2: 91%     Weight:      Height:         Intake/Output Summary (Last 24 hours) at 10/04/2021 1251 Last data filed at 10/04/2021 0500 Gross per 24 hour  Intake 1063.26 ml  Output 2000 ml  Net -936.74 ml    PHYSICAL EXAM General: Ill-appearing Caucasian male,  in no acute distress. Sitting upright in PCU bed. HEENT:  Normocephalic and atraumatic. Neck:   No JVD.   Lungs: Normal respiratory effort on 2L by [place]. Clear bilaterally to auscultation anteriorly. No wheezes, crackles, rhonchi.  Heart: Tachy but regular. Normal S1 and S2 without gallops or murmurs. Abdomen: Non-distended appearing.  Msk: Normal strength and tone for age. Extremities:  Significant bilateral lower extremity edema, RLE with venous stasis changes and woody appearance to skin. Left leg wrapped in ace bandage s/p BKA  Neuro: somnolent, does not stay awake long enough to answer orientation  Psych: somnolent   LABS: Basic Metabolic Panel: Recent Labs    10/03/21 0303 10/04/21 0621  NA 139 142  K 4.7 4.9  CL 100 106  CO2 33* 32  GLUCOSE 142* 154*  BUN 35* 34*  CREATININE 1.00 0.97  CALCIUM 8.2* 8.0*  MG 2.0 1.9    Liver Function Tests: No results for input(s): AST, ALT, ALKPHOS, BILITOT, PROT, ALBUMIN in the last 72 hours.  No results for input(s): LIPASE, AMYLASE in the last 72 hours. CBC: Recent Labs    10/03/21 0303 10/04/21 0621  WBC 5.4 9.5  HGB 11.6* 9.7*  HCT 39.6 33.1*  MCV 91.0 91.7  PLT 232 236    Cardiac Enzymes: Recent Labs    10/02/21 1251  CKTOTAL 32*   BNP: Invalid input(s): POCBNP D-Dimer: No results for input(s): DDIMER in the last 72 hours. Hemoglobin A1C: No results for input(s): HGBA1C in the last 72 hours. Fasting Lipid Panel: No results  for input(s): CHOL, HDL, LDLCALC, TRIG, CHOLHDL, LDLDIRECT in the last 72 hours. Thyroid Function Tests: No results for input(s): TSH, T4TOTAL, T3FREE, THYROIDAB in the last 72 hours.  Invalid input(s): FREET3 Anemia Panel: No results for input(s): VITAMINB12, FOLATE, FERRITIN, TIBC, IRON, RETICCTPCT in the last 72 hours.  No results found.   Echo LVEF 50-55% 05/10/2021  TELEMETRY: Atrial fibrillation 111 bpm:  ASSESSMENT AND PLAN:  Principal Problem:   Osteomyelitis (HCC) Active Problems:   Essential hypertension, benign   RLS (restless legs syndrome)   Obesity, Class III, BMI  40-49.9 (morbid obesity) (HCC)   Acute on chronic diastolic CHF (congestive heart failure) (HCC)   Foot ulcer with fat layer exposed, left (HCC)   AF (paroxysmal atrial fibrillation) ([place])   Uncontrolled type 2 diabetes mellitus with hyperglycemia, with long-term current use of insulin (HCC)   Malnutrition of moderate degree   Generalized weakness   Acute metabolic encephalopathy   Acute respiratory failure with hypoxia and hypercapnia (HCC)   OSA (obstructive sleep apnea)    1. Acute on chronic diastolic congestive heart failure, HFpEF, LVEF 50-55% 05/10/2021, good diuresis after IV furosemide 2.  Paroxysmal atrial fibrillation/atrial flutter, currently in atrial flutter heart rate 115 bpm, on amiodarone and metoprolol for rate control and Eliquis for stroke prevention, cardizem '360mg'$  CD 3.  Coronary artery disease, status post CABG 2006, PCI RCA 01/2020, currently without chest pain 4.  Left foot osteomyelitis on IV vancomycin, left BKA pending 5.  Acute kidney injury, improving to Cr 1.03 and >60    Recommendations   1.  Agree with current therapy 2.  Continue IV lasix '40mg'$  BID, BMP daily  3.  continue to hold eliquis for now, vascular surgery to reassess wound on 6/9 and hopefully restart therafter 4.  Continue amiodarone, metoprolol tartrate, and cardizem for rate and rhythm control 5.  S/p Left BKA 6/6   This patient's plan of care was discussed and created with Dr. Nehemiah Massed and he is in agreement.    Tristan Schroeder, PA-C 10/04/2021 12:51 PM

## 2021-10-04 NOTE — Assessment & Plan Note (Addendum)
Mental status has waxed and waned through the entire hospital course.  Continue BiPAP at night to blow off CO2.  We will get an MRI of the brain to rule out stroke or other process.  We will discontinue Seroquel since patient is lethargic during the day.

## 2021-10-04 NOTE — Progress Notes (Signed)
ID Pt had BKA yesterday Was agitated this morning and received haldol, dilaudid' Off bipap and smonolent  BP 109/71 (BP Location: Right Arm)   Pulse (!) 112   Temp 97.6 F (36.4 C) (Axillary)   Resp 14   Ht '6\' 4"'$  (1.93 m)   Wt (!) 192.8 kg   SpO2 94%   BMI 51.73 kg/m   Off BIPAP Somnolent On calling him he is confused Trying to pull things in air Hs-tachycardia Left bka- surgical dressing Abd soft  Labs    Latest Ref Rng & Units 10/04/2021    6:21 AM 10/03/2021    3:03 AM 10/02/2021    3:30 AM  CBC  WBC 4.0 - 10.5 K/uL 9.5   5.4   5.0    Hemoglobin 13.0 - 17.0 g/dL 9.7   11.6   10.9    Hematocrit 39.0 - 52.0 % 33.1   39.6   37.1    Platelets 150 - 400 K/uL 236   232   237         Latest Ref Rng & Units 10/04/2021    6:21 AM 10/03/2021    3:03 AM 10/02/2021    3:30 AM  CMP  Glucose 70 - 99 mg/dL 154   142   158    BUN 8 - 23 mg/dL 34   35   34    Creatinine 0.61 - 1.24 mg/dL 0.97   1.00   1.03    Sodium 135 - 145 mmol/L 142   139   138    Potassium 3.5 - 5.1 mmol/L 4.9   4.7   4.5    Chloride 98 - 111 mmol/L 106   100   101    CO2 22 - 32 mmol/L 32   33   30    Calcium 8.9 - 10.3 mg/dL 8.0   8.2   7.9         Impression/recommendation MRSA bacteremia secondary to left heel ulcer with underlying osteomyelitis. MRSA in wound culture as well Patient is currently Daptomycin Underwent BKA Repeat blood culture neg Low bioburden Will get 2 d echo to look at the valves  Will need IV antibiotic for minimum 4 weeks  Encephalopathy.  Multifactorial--t predominantly due to CO2 narcosis/infection . On intermittent BIPAP  Severe venous edema of both legs  AKI resolved  Paroxysmal A-fib with atrial flutter.  On amiodarone metoprolol and Eliquis  Coronary artery disease status post CABG  Diastolic CHF started on furosemide  Discussed the management with his wife at bedside and with the hospitalist

## 2021-10-04 NOTE — Progress Notes (Signed)
Provider notified due to increased pt anxiety and restlessness. Pt currently does not have anti-anxiety prn. New orders in

## 2021-10-04 NOTE — Progress Notes (Addendum)
Pt has become increasingly agitated, pulling at lines and bipap; prn haldol given. Pt currently denies pain but is grimacing and wincing with movement; prn dilaudid given and will continue to assess.

## 2021-10-05 ENCOUNTER — Inpatient Hospital Stay
Admit: 2021-10-05 | Discharge: 2021-10-05 | Disposition: A | Discharge status: Home / Self Care | Payer: Medicare Other | Attending: Infectious Diseases | Admitting: Infectious Diseases

## 2021-10-05 DIAGNOSIS — G2581 Restless legs syndrome: Secondary | ICD-10-CM

## 2021-10-05 DIAGNOSIS — M86672 Other chronic osteomyelitis, left ankle and foot: Secondary | ICD-10-CM | POA: Diagnosis not present

## 2021-10-05 DIAGNOSIS — R410 Disorientation, unspecified: Secondary | ICD-10-CM | POA: Diagnosis not present

## 2021-10-05 DIAGNOSIS — I5033 Acute on chronic diastolic (congestive) heart failure: Secondary | ICD-10-CM | POA: Diagnosis not present

## 2021-10-05 DIAGNOSIS — B9562 Methicillin resistant Staphylococcus aureus infection as the cause of diseases classified elsewhere: Secondary | ICD-10-CM | POA: Diagnosis not present

## 2021-10-05 DIAGNOSIS — M869 Osteomyelitis, unspecified: Secondary | ICD-10-CM | POA: Diagnosis not present

## 2021-10-05 DIAGNOSIS — A4102 Sepsis due to Methicillin resistant Staphylococcus aureus: Secondary | ICD-10-CM | POA: Diagnosis not present

## 2021-10-05 DIAGNOSIS — I452 Bifascicular block: Secondary | ICD-10-CM | POA: Diagnosis not present

## 2021-10-05 DIAGNOSIS — R7881 Bacteremia: Secondary | ICD-10-CM | POA: Diagnosis not present

## 2021-10-05 LAB — AEROBIC CULTURE W GRAM STAIN (SUPERFICIAL SPECIMEN): Gram Stain: NONE SEEN

## 2021-10-05 LAB — CBC
HCT: 33.6 % — ABNORMAL LOW (ref 39.0–52.0)
Hemoglobin: 10.3 g/dL — ABNORMAL LOW (ref 13.0–17.0)
MCH: 27.8 pg (ref 26.0–34.0)
MCHC: 30.7 g/dL (ref 30.0–36.0)
MCV: 90.6 fL (ref 80.0–100.0)
Platelets: 243 10*3/uL (ref 150–400)
RBC: 3.71 MIL/uL — ABNORMAL LOW (ref 4.22–5.81)
RDW: 16.8 % — ABNORMAL HIGH (ref 11.5–15.5)
WBC: 12 10*3/uL — ABNORMAL HIGH (ref 4.0–10.5)
nRBC: 0 % (ref 0.0–0.2)

## 2021-10-05 LAB — BASIC METABOLIC PANEL
Anion gap: 6 (ref 5–15)
BUN: 26 mg/dL — ABNORMAL HIGH (ref 8–23)
CO2: 32 mmol/L (ref 22–32)
Calcium: 8.1 mg/dL — ABNORMAL LOW (ref 8.9–10.3)
Chloride: 102 mmol/L (ref 98–111)
Creatinine, Ser: 0.84 mg/dL (ref 0.61–1.24)
GFR, Estimated: >60 mL/min (ref >60)
Glucose, Bld: 210 mg/dL — ABNORMAL HIGH (ref 70–99)
Potassium: 4.3 mmol/L (ref 3.5–5.1)
Sodium: 140 mmol/L (ref 135–145)

## 2021-10-05 LAB — GLUCOSE, CAPILLARY
Glucose-Capillary: 211 mg/dL — ABNORMAL HIGH (ref 70–99)
Glucose-Capillary: 254 mg/dL — ABNORMAL HIGH (ref 70–99)
Glucose-Capillary: 232 mg/dL — ABNORMAL HIGH (ref 70–99)
Glucose-Capillary: 235 mg/dL — ABNORMAL HIGH (ref 70–99)

## 2021-10-05 LAB — ECHOCARDIOGRAM COMPLETE
AR max vel: 2.29 cm2
AV Area VTI: 2.97 cm2
AV Area mean vel: 1.99 cm2
AV Mean grad: 5 mmHg
AV Peak grad: 8.1 mmHg
Ao pk vel: 1.42 m/s
Area-P 1/2: 5.02 cm2
Height: 76 in
MV VTI: 2.09 cm2
S' Lateral: 5.1 cm
Weight: 6800 oz

## 2021-10-05 LAB — SURGICAL PATHOLOGY

## 2021-10-05 LAB — HEPARIN LEVEL (UNFRACTIONATED)
Heparin Unfractionated: 0.21 IU/mL — ABNORMAL LOW (ref 0.30–0.70)
Heparin Unfractionated: 0.25 IU/mL — ABNORMAL LOW (ref 0.30–0.70)
Heparin Unfractionated: 0.3 IU/mL (ref 0.30–0.70)

## 2021-10-05 MED ORDER — AMIODARONE HCL 200 MG PO TABS
200.0000 mg | ORAL_TABLET | Freq: Every day | ORAL | Status: DC
  Administered 2021-10-06 – 2021-11-13 (×39): 200 mg via ORAL
  Filled 2021-10-05 (×39): qty 1

## 2021-10-05 MED ORDER — AMIODARONE HCL 200 MG PO TABS: 200.0000 mg | ORAL_TABLET | Freq: Two times a day (BID) | ORAL | Status: DC

## 2021-10-05 MED ORDER — HEPARIN BOLUS VIA INFUSION
1900.0000 [IU] | Freq: Once | INTRAVENOUS | Status: AC
  Administered 2021-10-05: 1900 [IU] via INTRAVENOUS
  Filled 2021-10-05: qty 1900

## 2021-10-05 MED ORDER — INSULIN ASPART 100 UNIT/ML IJ SOLN
8.0000 [IU] | Freq: Three times a day (TID) | INTRAMUSCULAR | Status: DC
Start: 2021-10-05 — End: 2021-10-06
  Administered 2021-10-05: 8 [IU] via SUBCUTANEOUS
  Filled 2021-10-05: qty 1

## 2021-10-05 MED ORDER — HEPARIN BOLUS VIA INFUSION
2000.0000 [IU] | Freq: Once | INTRAVENOUS | Status: AC
  Administered 2021-10-05: 2000 [IU] via INTRAVENOUS
  Filled 2021-10-05: qty 2000

## 2021-10-05 NOTE — Assessment & Plan Note (Addendum)
Previously using CPAP at night. Now on BiPAP secondary to CO2 retention.  Qualifies for NIV with elevated PCO2 on ABG.

## 2021-10-05 NOTE — Progress Notes (Addendum)
Inpatient Diabetes Program Recommendations  AACE/ADA: New Consensus Statement on Inpatient Glycemic Control  Target Ranges:  Prepandial:   less than 140 mg/dL      Peak postprandial:   less than 180 mg/dL (1-2 hours)      Critically ill patients:  140 - 180 mg/dL    Latest Reference Range & Units 10/05/21 09:06 10/05/21 12:34  Glucose-Capillary 70 - 99 mg/dL 211 (H) 254 (H)    Latest Reference Range & Units 10/04/21 11:14 10/04/21 16:14 10/04/21 19:37 10/04/21 20:38  Glucose-Capillary 70 - 99 mg/dL 142 (H) 160 (H) 219 (H) 233 (H)   Review of Glycemic Control  Diabetes history: DM2 Outpatient Diabetes medications: Lantus 25 units BID, Humalog 15 units BID, Metfomrin 1000 mg BID Current orders for Inpatient glycemic control: Semglee 25 units BID, Novolog 0-20 units TID with meals, Novolog 5 units TID with meals  Inpatient Diabetes Program Recommendations:    Insulin: Please consider increasing meal coverage to Novolog 8 units TID with meals.  Thanks, Barnie Alderman, RN, MSN, [place] Diabetes Coordinator Inpatient Diabetes Program [phone] (Team Pager from 8am to [place])

## 2021-10-05 NOTE — TOC Progression Note (Signed)
Transition of Care (TOC) - Progression Note    Patient Details  Name: [name]. MRN: [id number] Date of Birth: 14-Dec-1955  [facility] CM/SW [address], [place] Phone Number: 10/05/2021, 10:00 AM  Clinical Narrative:     CSW spoke with patient's spouse [name] who confirms plan is for patient to go to SNF at time of discharge, requests not [facility] and prefers local facilities.   CSW informed [name] that patient referrals were sent out however we have no local bed offers at this time. She inquired as to [facility], [place] informed her [facility] in [place] is not in network with patient's insurance.   CSW also explained to [facility] that due to patient's confusion and mittens, this is also a barrier in identifying facilities that have staff to adequately care for patient's needs. [name] expressed understanding.   CSW informed [name] we will expand search to [place].   Patient continues to pend bed offers.   Expected Discharge Plan: [place] Barriers to Discharge: Continued Medical Work up  Expected Discharge Plan and Services Expected Discharge Plan: [place] In-house Referral: NA   Post Acute Care Choice: Resumption of Svcs/PTA Provider Living arrangements for the past 2 months: Single Family Home                                       Social Determinants of Health (SDOH) Interventions    Readmission Risk Interventions    09/29/2021   11:12 AM  Readmission Risk Prevention Plan  Transportation Screening Complete  PCP or Specialist Appt within 3-5 Days Complete  HRI or [place] Complete  Social Work Consult for [place] Planning/Counseling Complete  Palliative Care Screening Not Applicable  Medication Review Press photographer) Complete

## 2021-10-05 NOTE — Consult Note (Signed)
ANTICOAGULATION CONSULT NOTE  Pharmacy Consult for IV Heparin Indication: atrial fibrillation  Patient Measurements: Height: '6\' 4"'$  (193 cm) Weight: (!) 192.8 kg (425 lb) IBW/kg (Calculated) : 86.8 Heparin Dosing Weight: 130.7 kg  Labs: Recent Labs    10/02/21 2119 10/03/21 0303 10/03/21 0303 10/04/21 0621 10/05/21 0020 10/05/21 0824 10/05/21 1012  HGB  --  11.6*   < > 9.7*  --  10.3*  --   HCT  --  39.6  --  33.1*  --  33.6*  --   PLT  --  232  --  236  --  243  --   APTT 70* 35  --   --   --   --   --   HEPARINUNFRC  --  <0.10*  --   --  0.21*  --  0.30  CREATININE  --  1.00  --  0.97  --  0.84  --    < > = values in this interval not displayed.     Estimated Creatinine Clearance: 158.1 mL/min (by C-G formula based on SCr of 0.84 mg/dL).   Medical History: Past Medical History:  Diagnosis Date   Arrhythmia    atrial fibrillation   Asthma    CHF (congestive heart failure) (HCC)    COPD (chronic obstructive pulmonary disease) (HCC)    Coronary artery disease    Depression    Diabetes mellitus without complication (HCC)    Gout    History anabolic steroid use    Hyperlipidemia    Hypertension    Hypogonadism in male    Morbid obesity ([place])    Myocardial infarction ([place])    Peripheral vascular disease (HCC)    Perirectal abscess    Pleurisy    Sleep apnea    CPAP at night, no oxygen   Varicella   Heparin Dosing Weight: 130.7 kg  Medications:  Apixaban 5 mg BID (last dose 6/3 at 0933)   Assessment: Patient is a 66 y/o M with medical history as above and including Afib on apixaban who is admitted with left foot ulcer / left foot OM. Vascular surgery following and plan is for L BKA vs AKA pending scheduling. Apixaban is on hold for impending procedure. Pharmacy consulted to initiate and manage heparin infusion peri-operatively for Afib. Vascular surgery to evaluate on Friday to determine whether Eliquis can be restarted  CBC notable for stable anemia.  Baseline aPTT pending. Baseline INR 1.3.    Goal of Therapy:  Heparin level 0.3-0.7 units/ml aPTT 66 - 102 seconds Monitor platelets by anticoagulation protocol: Yes   Plan:   Heparin level now SUBtherapeutic: bolus 2000 units IV heparin then increase increase rate to 2900 units/hr  Will recheck heparin level 6 hrs after rate change CBC daily while on IV heparin   Dallie Piles Clinical Pharmacist 10/05/2021

## 2021-10-05 NOTE — Progress Notes (Signed)
*  PRELIMINARY RESULTS* Echocardiogram 2D Echocardiogram has been performed.  [name] Sport 10/05/2021, 7:42 AM

## 2021-10-05 NOTE — Progress Notes (Signed)
ID Pt is sitting in bed and eating More alert  BP 118/74   Pulse (!) 114   Temp 98.1 F (36.7 C)   Resp 17   Ht '6\' 4"'$  (1.93 m)   Wt (!) 192.8 kg   SpO2 (!) 88% Comment: with transfer to sitting.  BMI 51.73 kg/m   More alert Still some confusion Eating No distress Chest b/l air entry Tachycardia Left BKA- dressing not removed CNS non focal  Labs    Latest Ref Rng & Units 10/05/2021    8:24 AM 10/04/2021    6:21 AM 10/03/2021    3:03 AM  CBC  WBC 4.0 - 10.5 K/uL 12.0  9.5  5.4   Hemoglobin 13.0 - 17.0 g/dL 10.3  9.7  11.6   Hematocrit 39.0 - 52.0 % 33.6  33.1  39.6   Platelets 150 - 400 K/uL 243  236  232        Latest Ref Rng & Units 10/05/2021    8:24 AM 10/04/2021    6:21 AM 10/03/2021    3:03 AM  CMP  Glucose 70 - 99 mg/dL 210  154  142   BUN 8 - 23 mg/dL 26  34  35   Creatinine 0.61 - 1.24 mg/dL 0.84  0.97  1.00   Sodium 135 - 145 mmol/L 140  142  139   Potassium 3.5 - 5.1 mmol/L 4.3  4.9  4.7   Chloride 98 - 111 mmol/L 102  106  100   CO2 22 - 32 mmol/L 32  32  33   Calcium 8.9 - 10.3 mg/dL 8.1  8.0  8.2        Impression/recommendation MRSA bacteremia secondary to left heel ulcer with underlying osteomyelitis. MRSA in wound culture as well Patient is currently Daptomycin Underwent BKA Repeat blood culture neg Low bioburden 2 d echo no vegetation- Mitral valve is thickened Will need IV antibiotic for minimum 4 weeks- 6 weeks  Encephalopathy.  Improving Multifactorial--predominantly due to CO2 narcosis/infection/ infection  . On intermittent BIPAP  Severe venous edema of both legs  AKI resolved  Paroxysmal A-fib with atrial flutter.  On amiodarone metoprolol and Eliquis  Coronary artery disease status post CABG  Diastolic CHF started on furosemide  Discussed the management with patient and  his wife at bedside

## 2021-10-05 NOTE — Consult Note (Signed)
ANTICOAGULATION CONSULT NOTE  Pharmacy Consult for IV Heparin Indication: atrial fibrillation  Patient Measurements: Height: '6\' 4"'$  (193 cm) Weight: (!) 192.8 kg (425 lb) IBW/kg (Calculated) : 86.8 Heparin Dosing Weight: 130.7 kg  Labs: Recent Labs    10/02/21 0330 10/02/21 1251 10/02/21 1343 10/02/21 2119 10/03/21 0303 10/04/21 0621 10/05/21 0020  HGB 10.9*  --   --   --  11.6* 9.7*  --   HCT 37.1*  --   --   --  39.6 33.1*  --   PLT 237  --   --   --  232 236  --   APTT 64*  --  51* 70* 35  --   --   HEPARINUNFRC 0.31 0.43  --   --  <0.10*  --  0.21*  CREATININE 1.03  --   --   --  1.00 0.97  --   CKTOTAL  --  32*  --   --   --   --   --      Estimated Creatinine Clearance: 136.9 mL/min (by C-G formula based on SCr of 0.97 mg/dL).   Medical History: Past Medical History:  Diagnosis Date   Arrhythmia    atrial fibrillation   Asthma    CHF (congestive heart failure) (HCC)    COPD (chronic obstructive pulmonary disease) (HCC)    Coronary artery disease    Depression    Diabetes mellitus without complication (HCC)    Gout    History anabolic steroid use    Hyperlipidemia    Hypertension    Hypogonadism in male    Morbid obesity ([place])    Myocardial infarction ([place])    Peripheral vascular disease (HCC)    Perirectal abscess    Pleurisy    Sleep apnea    CPAP at night, no oxygen   Varicella   Heparin Dosing Weight: 130.7 kg  Medications:  Apixaban 5 mg BID (last dose 6/3 at 0933)   Assessment: Patient is a 66 y/o M with medical history as above and including Afib on apixaban who is admitted with left foot ulcer / left foot OM. Vascular surgery following and plan is for L BKA vs AKA pending scheduling. Apixaban is on hold for impending procedure. Pharmacy consulted to initiate and manage heparin infusion peri-operatively for Afib.   CBC notable for stable anemia. Baseline aPTT pending. Baseline INR 1.3.   Date Time aPTT / HL Rate/comment 6/4 2055 66  2100  un/hr 6/5 0330 64 / 0.31  subthera/therapeutic 6/5 1251 51/0.43 Subthera/Thera-  inc from 2250 u/hr to 2300 u/hr 6/5       2119   70                    Therapeutic @ 2300 units/hr.  6/6       0303   35 / < 0.1         Heparin gtt d/c'd @ 0000 --- 6/8       0020      0.21              Subtherapeutic @ 2300 units/hr  Goal of Therapy:  Heparin level 0.3-0.7 units/ml aPTT 66 - 102 seconds Monitor platelets by anticoagulation protocol: Yes   Plan:  Left leg amputation planned for 6/6 AM >> Will resume at previous therapeutic rate 6/07, but skip initial bolus per d/w Dr. Delana Meyer.  6/5 @ 2119: aPTT = 70  >> So last therapeutic @ 2300  units/hr 6/6 '@0303'$ : HL on was < 0.1 >> So no further DOAC interference. Titrate by HL alone.  6/8:  HL @ 0020 = 0.21, subtherapeutic  Will order heparin 1900 units IV X 1 bolus and increase drip rate to 2550 units/hr.   Will recheck HL 6 hrs after rate change.   Zeb D Clinical Pharmacist 10/05/2021

## 2021-10-05 NOTE — Progress Notes (Signed)
Called to place patient on BIPAP at night. Mittens are still required at this time, therefore it is a contraindication to place patient on BIPAP if patient is unable to remove unit from his face.I communicated this with covering RN.

## 2021-10-05 NOTE — Consult Note (Signed)
ANTICOAGULATION CONSULT NOTE  Pharmacy Consult for IV Heparin Indication: atrial fibrillation  Patient Measurements: Height: '6\' 4"'$  (193 cm) Weight: (!) 192.8 kg (425 lb) IBW/kg (Calculated) : 86.8 Heparin Dosing Weight: 130.7 kg  Labs: Recent Labs    10/02/21 2119 10/03/21 0303 10/03/21 0303 10/04/21 0621 10/05/21 0020 10/05/21 0824 10/05/21 1012  HGB  --  11.6*   < > 9.7*  --  10.3*  --   HCT  --  39.6  --  33.1*  --  33.6*  --   PLT  --  232  --  236  --  243  --   APTT 70* 35  --   --   --   --   --   HEPARINUNFRC  --  <0.10*  --   --  0.21*  --  0.30  CREATININE  --  1.00  --  0.97  --  0.84  --    < > = values in this interval not displayed.     Estimated Creatinine Clearance: 158.1 mL/min (by C-G formula based on SCr of 0.84 mg/dL).   Medical History: Past Medical History:  Diagnosis Date   Arrhythmia    atrial fibrillation   Asthma    CHF (congestive heart failure) (HCC)    COPD (chronic obstructive pulmonary disease) (HCC)    Coronary artery disease    Depression    Diabetes mellitus without complication (HCC)    Gout    History anabolic steroid use    Hyperlipidemia    Hypertension    Hypogonadism in male    Morbid obesity ([place])    Myocardial infarction ([place])    Peripheral vascular disease (HCC)    Perirectal abscess    Pleurisy    Sleep apnea    CPAP at night, no oxygen   Varicella   Heparin Dosing Weight: 130.7 kg  Medications:  Apixaban 5 mg BID (last dose 6/3 at 0933)   Assessment: Patient is a 66 y/o M with medical history as above and including Afib on apixaban who is admitted with left foot ulcer / left foot OM. Vascular surgery following and plan is for L BKA vs AKA pending scheduling. Apixaban is on hold for impending procedure. Pharmacy consulted to initiate and manage heparin infusion peri-operatively for Afib.   CBC notable for stable anemia. Baseline aPTT pending. Baseline INR 1.3.   Date Time aPTT /  HL Rate/comment 6/4 2055 66  2100 un/hr 6/5 0330 64 / 0.31  subthera/therapeutic 6/5 1251 51/0.43 Subthera/Thera-  inc from 2250 u/hr to 2300 u/hr 6/5       2119   70                    Therapeutic @ 2300 units/hr.  6/6       0303   35 / < 0.1         Heparin gtt d/c'd @ 0000 --- 6/8       0020    0.21              Subtherapeutic @ 2300 units/hr 6/8 1012 0.30  Borderline thera/ inc from 2550 to 2600 u/hr  Goal of Therapy:  Heparin level 0.3-0.7 units/ml aPTT 66 - 102 seconds Monitor platelets by anticoagulation protocol: Yes   Plan:  Left leg amputation planned for 6/6 AM >> Will resume at previous therapeutic rate 6/07, but skip initial bolus per d/w Dr. Delana Meyer.  6/5 @ 2119: aPTT = 70  >>  So last therapeutic @ 2300 units/hr 6/6 '@0303'$ : HL on was < 0.1 >> So no further DOAC interference. Titrate by HL alone.  6/8:  HL @ 1012 = 0.30, therapeutic x1. Given borderline therapeutic, will increase drip rate slightly to 2600 units/hr  Will recheck HL 6 hrs after rate change.   Darrick Penna Clinical Pharmacist 10/05/2021

## 2021-10-05 NOTE — Progress Notes (Signed)
Physical Therapy Re-Evaluation Patient Details Name: [name]. MRN: [id number] DOB: 02/22/1956 Today's Date: 10/05/2021  History of Present Illness  [name] is a 83yoM who comes [facility] on [facility]21 c progression of Left chornic heel ulcer. MRI showed significant progressive osteomyelitis in the left calcaneus. PMH HTN, IDDM2, morbid obesity, depression, CAD s/p CABG 2006 and PCI to RCA in 12/2020, chronic B venous stasis on Un[place] boots, chronic L foot ulcer, COPD, OSA on CPAP nightly, ai-fib, PAD, s/p multiple toe amputations.  S/p L BKA on 10/03/21.    Clinical Impression  Pt received in Semi-Fowler's position and agreeable to therapy.  Pt still somewhat confused according to wife, but much more lucid today with thoughts.  Pt participated in bed-level exercises with decreased strength of the L LE likely due to the BKA.  Pt then needed maxA for assistance to sit EOB.  Pt has tendency to have R lateral lean and then posterior lean in sitting EOB.  Pt sat for a ~5 minutes as tolerable before getting fatigued and requesting to go back to bed.  Pt requires +2 maxA to get back into bed and for proper positioning.  Pt and wife asked about prosthetic and was educated on it being a long journey towards that and also discussing mobility issues within their current home.  Current discharge plans to SNF are appropriate at this time due to inability to navigate their home.  Pt will continue to benefit from skilled therapy in order to address deficits listed below.  Pt may need wheelchair for mobility, but unlikely with be able to navigate around the home with wheelchair due to door frames not being large enough.      Recommendations for follow up therapy are one component of a multi-disciplinary discharge planning process, led by the attending physician.  Recommendations may be updated based on patient status, additional functional criteria and insurance authorization.  Follow Up Recommendations  Skilled nursing-short term rehab (<3 hours/day)    Assistance Recommended at Discharge Frequent or constant Supervision/Assistance  Patient can return home with the following  Two people to help with bathing/dressing/bathroom;Two people to help with walking and/or transfers;Assistance with cooking/housework;Assist for transportation;Help with stairs or ramp for entrance;Direct supervision/assist for medications management;Direct supervision/assist for financial management    Equipment Recommendations None recommended by PT  Recommendations for Other Services       Functional Status Assessment Patient has had a recent decline in their functional status and/or demonstrates limited ability to make significant improvements in function in a reasonable and predictable amount of time     Precautions / Restrictions Precautions Precautions: Fall Restrictions Weight Bearing Restrictions: Yes      Mobility  Bed Mobility Overal bed mobility: Needs Assistance Bed Mobility: Rolling, Supine to Sit Rolling: Max assist   Supine to sit: Max assist     General bed mobility comments: max A bed mobility    Transfers                   General transfer comment: declined    Ambulation/Gait                  Stairs            Wheelchair Mobility    Modified Rankin (Stroke Patients Only)       Balance Overall balance assessment: Needs assistance Sitting-balance support: Bilateral upper extremity supported (R Foot Supported)   Sitting balance - Comments: pt with difficulty getting up to  sitting and has right sided and posterior lean initially. Postural control: Posterior lean, Right lateral lean     Standing balance comment: deferred                             Pertinent Vitals/Pain Pain Assessment Pain Assessment: No/denies pain    Home Living Family/patient expects to be discharged to:: Private residence Living Arrangements: Spouse/significant  other Available Help at Discharge: Family;Available PRN/intermittently Type of Home: House Home Access: Stairs to enter Entrance Stairs-Rails: Can reach both;Left;Right Entrance Stairs-Number of Steps: 4   Home Layout: One level Home Equipment: Conservation officer, nature (2 wheels);Cane - quad;Shower seat - built in Additional Comments: Bariatric RW; knee scooter    Prior Function Prior Level of Function : Independent/Modified Independent             Mobility Comments: Pt reports he has been ambulating with RW or using knee scooter (mostly in community) ADLs Comments: wife assists for LB dressing. He has been washing at sink to keep LE dry prior to admission. Wife assists with IADLs     Hand Dominance   Dominant Hand: Right    Extremity/Trunk Assessment   Upper Extremity Assessment Upper Extremity Assessment: Generalized weakness    Lower Extremity Assessment Lower Extremity Assessment: Generalized weakness;LLE deficits/detail LLE Deficits / Details: Weakness from below-knee amputation.       Communication   Communication: No difficulties  Cognition Arousal/Alertness: Awake/alert Behavior During Therapy: Flat affect Overall Cognitive Status: Impaired/Different from baseline                                 General Comments: Pt with increased confusion and speaks about "[employer]"        General Comments      Exercises Total Joint Exercises Ankle Circles/Pumps: AROM, Strengthening, Right, 10 reps, Supine Quad Sets: AROM, Strengthening, Both, 10 reps, Supine Gluteal Sets: AROM, Strengthening, Both, 10 reps, Supine Hip ABduction/ADduction: AROM, Strengthening, Both, 10 reps, Supine Straight Leg Raises: AROM, Strengthening, Both, 10 reps, Supine   Assessment/Plan    PT Assessment Patient needs continued PT services  PT Problem List Decreased strength;Decreased activity tolerance;Decreased balance;Decreased mobility;Decreased cognition;Decreased knowledge  of use of DME;Decreased safety awareness;Cardiopulmonary status limiting activity       PT Treatment Interventions DME instruction;Balance training;Gait training;Neuromuscular re-education;Stair training;Cognitive remediation;Functional mobility training;Patient/family education;Therapeutic activities;Wheelchair mobility training;Therapeutic exercise;Manual techniques;Modalities;Other (comment)    PT Goals (Current goals can be found in the Care Plan section)  Acute Rehab PT Goals Patient Stated Goal: pt did not participate in goal assessment. PT Goal Formulation: Patient unable to participate in goal setting Time For Goal Achievement: 10/19/21 Potential to Achieve Goals: Good    Frequency Min 2X/week     Co-evaluation               AM-PAC PT "6 Clicks" Mobility  Outcome Measure Help needed turning from your back to your side while in a flat bed without using bedrails?: A Lot Help needed moving from lying on your back to sitting on the side of a flat bed without using bedrails?: A Lot Help needed moving to and from a bed to a chair (including a wheelchair)?: Total Help needed standing up from a chair using your arms (e.g., wheelchair or bedside chair)?: Total Help needed to walk in hospital room?: Total Help needed climbing 3-5 steps with a railing? : Total  6 Click Score: 8    End of Session Equipment Utilized During Treatment: Oxygen Activity Tolerance: Patient limited by lethargy;Treatment limited secondary to medical complications (Comment);Other (comment) Patient left: in bed;with bed alarm set Nurse Communication: Mobility status PT Visit Diagnosis: Difficulty in walking, not elsewhere classified (R26.2);Other abnormalities of gait and mobility (R26.89)    Time: 4373-5789 PT Time Calculation (min) (ACUTE ONLY): 30 min   Charges:   PT Evaluation $PT Re-evaluation: 1 Re-eval PT Treatments $Therapeutic Exercise: 8-22 mins $Therapeutic Activity: 8-22 mins         Gwenlyn Saran, PT, DPT 10/05/21, 1:44 PM   Christie Nottingham 10/05/2021, 1:24 PM

## 2021-10-05 NOTE — Progress Notes (Addendum)
[facility] Cardiology  Patient ID: [name]. MRN: [id number] DOB/AGE: July 24, 1955 66 y.o.   Admit date: 09/28/2021 Referring Physician Dr. Francine Graven Primary Physician Dr. Felipa Furnace Primary Cardiologist Dr. Donnelly Angelica Reason for Consultation acute on chronic HFpEF    HPI: [name]. is a [phone] with a PMH significant for CAD s/p CABG 2006 and PCI of RCA 01/2020, HFpEF (LVEF 50-55% 04/2021), A. fib / flutter on apixaban, amiodarone and digoxin, HTN, BPH, morbid obesity, poorly controlled type 2 diabetes,  PAD s/p left third toe amputation, chronic venous stasis on Unna boots, OSA on CPAP who presented to [facility] ED on 09/28/21 with increasing pain and swelling in his left leg, and imaging concerning for worsening osteomyelitis of his left foot. He has has onoging infections needing wound debridement in his left lower leg over the past several months. Cardiology is consulted for assistance with his heart failure.    Interval History:  -alert this morning, on [place] oxygen, granddaughter at bedside -made ~928m urine overnight, confirmed with nurse tech  -feels tired, no chest pain or other complaints   Vitals:   10/05/21 0000 10/05/21 0400 10/05/21 0500 10/05/21 0832  BP:    (!) 164/84  Pulse:    (!) 116  Resp:      Temp: 99.1 F (37.3 C) 98.9 F (37.2 C)  98.1 F (36.7 C)  TempSrc: Oral Oral    SpO2:   96% (!) 88%  Weight:      Height:         Intake/Output Summary (Last 24 hours) at 10/05/2021 02130Last data filed at 10/04/2021 1831 Gross per 24 hour  Intake 306 ml  Output 100 ml  Net 206 ml    PHYSICAL EXAM General: Ill-appearing Caucasian male,  in no acute distress. Sitting upright in PCU bed with granddaugther at bedside HEENT:  Normocephalic and atraumatic. Neck:   No JVD.  Lungs: Normal respiratory effort on 2L by [place]. Clear bilaterally to auscultation anteriorly. No wheezes, crackles, rhonchi.  Heart: Tachy but regular. Normal S1 and S2 without gallops or  murmurs. Abdomen: Obese appearing.  Msk: Normal strength and tone for age. Extremities:  Significant bilateral lower extremity edema, RLE with venous stasis changes and woody appearance to skin, still with significant volume. Left leg wrapped in ace bandage s/p BKA  Neuro: awake, alert Psych: answers questions appropriately    LABS: Basic Metabolic Panel: Recent Labs    10/03/21 0303 10/04/21 0621  NA 139 142  K 4.7 4.9  CL 100 106  CO2 33* 32  GLUCOSE 142* 154*  BUN 35* 34*  CREATININE 1.00 0.97  CALCIUM 8.2* 8.0*  MG 2.0 1.9    Liver Function Tests: No results for input(s): "AST", "ALT", "ALKPHOS", "BILITOT", "PROT", "ALBUMIN" in the last 72 hours.  No results for input(s): "LIPASE", "AMYLASE" in the last 72 hours. CBC: Recent Labs    10/03/21 0303 10/04/21 0621  WBC 5.4 9.5  HGB 11.6* 9.7*  HCT 39.6 33.1*  MCV 91.0 91.7  PLT 232 236    Cardiac Enzymes: Recent Labs    10/02/21 1251  CKTOTAL 32*    BNP: Invalid input(s): "POCBNP" D-Dimer: No results for input(s): "DDIMER" in the last 72 hours. Hemoglobin A1C: No results for input(s): "HGBA1C" in the last 72 hours. Fasting Lipid Panel: No results for input(s): "CHOL", "HDL", "LDLCALC", "TRIG", "CHOLHDL", "LDLDIRECT" in the last 72 hours. Thyroid Function Tests: No results for input(s): "TSH", "T4TOTAL", "T3FREE", "THYROIDAB" in the  last 72 hours.  Invalid input(s): "FREET3" Anemia Panel: No results for input(s): "VITAMINB12", "FOLATE", "FERRITIN", "TIBC", "IRON", "RETICCTPCT" in the last 72 hours.  No results found.   Echo LVEF 50-55% 05/10/2021  TELEMETRY: Atrial fibrillation 111 bpm:  ASSESSMENT AND PLAN:  Principal Problem:   Sepsis due to methicillin resistant Staphylococcus aureus (MRSA) (HCC) Active Problems:   Essential hypertension, benign   RLS (restless legs syndrome)   Osteomyelitis (HCC)   Obesity, Class III, BMI 40-49.9 (morbid obesity) (HCC)   Acute on chronic diastolic CHF  (congestive heart failure) (HCC)   AF (paroxysmal atrial fibrillation) ([place])   Uncontrolled type 2 diabetes mellitus with hyperglycemia, with long-term current use of insulin (HCC)   Malnutrition of moderate degree   Generalized weakness   Acute metabolic encephalopathy   Acute respiratory failure with hypoxia and hypercapnia (HCC)   OSA (obstructive sleep apnea)   Acute delirium   Acute on chronic respiratory failure with hypoxia and hypercapnia ([place])    1. Acute on chronic diastolic congestive heart failure, HFpEF, LVEF 50-55% 05/10/2021, good diuresis after IV furosemide but remains volume up and on supplemental O2 2.  Paroxysmal atrial fibrillation/atrial flutter, currently in atrial flutter heart rate 115 bpm, on amiodarone and metoprolol for rate control and Eliquis for stroke prevention, cardizem '360mg'$  CD 3.  Coronary artery disease, status post CABG 2006, PCI RCA 01/2020, currently without chest pain 4.  Left foot osteomyelitis on IV vancomycin, left BKA 6/6 with Dr. Delana Meyer 5.  Acute kidney injury, improving to Cr 1.03 and >60  6.  MRSA bacteremia, osteomyelitis of L foot likely source, on IV daptomycin, repeat echo ordered by ID    Recommendations   1.  Agree with current therapy 2.  Continue IV lasix '40mg'$  BID, BMP daily  3.  Continue heparin, hold eliquis for now, vascular surgery to reassess wound on 6/9 and hopefully restart therafter 4.  Continue amiodarone, metoprolol tartrate, and cardizem for rate and rhythm control 5.  S/p Left BKA 6/6   This patient's plan of care was discussed and created with Dr. Nehemiah Massed and he is in agreement.    Tristan Schroeder, PA-C 10/05/2021 8:33 AM  The patient appears much improved at this time with significant urine output and improvements of diastolic dysfunction heart failure.  Breathing is better on lower oxygen requirement.  Atrial fibrillation is much better controlled on current diltiazem.  Patient does not have any anginal  symptoms or worsening congestive heart failure symptoms at this time.  Patient does have osteomyelitis and current echocardiogram shows that he has normal LV systolic function with thickening of the aortic and mitral leaflets but no evidence obvious vegetation.  If further necessity will look further by transesophageal echocardiogram but currently does not appear to be necessary.  We will continue to diurese as per above medication management  The patient has been interviewed and examined. I agree with assessment and plan above. Serafina Royals MD [facility]

## 2021-10-05 NOTE — Progress Notes (Signed)
Progress Note   Patient: [name]. MEQ:[id number] DOB: 1955/11/17 DOA: 09/28/2021     7 DOS: the patient was seen and examined on 10/05/2021   Brief hospital course: [name]. is a 66 y.o. male with medical history significant for chronic diastolic dysfunction CHF with last known LVEF of 50 to 55%, hypertension, coronary artery disease, depression, COPD, A-fib, morbid obesity (BMI 49), obstructive sleep apnea, status post recent wound debridement and partial calcanectomy (05/16) currently on oral antibiotic therapy after completion of IV who presents to the ER for evaluation of bilateral lower extremity swelling as well as increased redness and swelling involving the left lower extremity mostly over the heel. MRI of the foot showed left foot osteomyelitis, placed on vancomycin and Zosyn. Patient also has evidence of acute on chronic diastolic congestive heart failure.  Started IV Lasix. Patient also had a blood culture positive for MRSA secondary to osteomyelitis.  Left BKA performed on 6/6.  Currently patient on daptomycin as per ID to treat MRSA sepsis. Acute delirium improving but still has some confusion.  Continue Seroquel at night.  Continue BiPAP at night.  Assessment and Plan: * Sepsis due to methicillin resistant Staphylococcus aureus (MRSA) ([place]) MRSA growing out of blood cultures on 09/28/2021.  Repeat blood cultures so far negative for 3 days.  Currently on daptomycin.  Source of infection likely osteomyelitis.  Acute delirium Trial of Seroquel at night to get sleeping.  Mental status better today than yesterday but still has some periods of confusion.  Osteomyelitis [facility]) Patient has an ulcer over his left heel and is status post recent left heel partial calcanectomy. Status post left BKA on 10/03/2021. Patient on IV daptomycin.    Acute on chronic diastolic CHF (congestive heart failure) ([place]) Patient's last known LVEF is 55 to 60% from a 2D echocardiogram  which was done 01/23 Continue Lasix IV.  Patient also on metoprolol.  Uncontrolled type 2 diabetes mellitus with hyperglycemia, with long-term current use of insulin (HCC) Last hemoglobin A1c in the chart of 10.2.  Patient on Semglee insulin 25 units twice a day.  Increase aspart insulin to 8 units 3 times a day with meals plus sliding scale  Acute on chronic respiratory failure with hypoxia and hypercapnia (HCC) Patient had CO2 retention and had BiPAP last night.  We will continue at night for at this point.  Spoke with TOC to look into candidate for noninvasive ventilation.  AF (paroxysmal atrial fibrillation) (HCC) Continue metoprolol, Cardizem and amiodarone for rate control.  Heparin drip started last night.  Vascular surgery to evaluate stump on Friday to determine whether we can get rid of the heparin drip and start Eliquis.  Obesity, Class III, BMI 40-49.9 (morbid obesity) ([place]) Patient will need a new weight postoperatively.  Essential hypertension, benign Continue metoprolol and diltiazem and Lasix  RLS (restless legs syndrome) Continue pramipexole  OSA (obstructive sleep apnea) Usually uses CPAP at night but will convert to BiPAP with CO2 retention.        Subjective: Patient more talkative today and yesterday.  Still has some periods of confusion but not agitated when I saw him.  Follow-up for MRSA sepsis and acute delirium  Physical Exam: Vitals:   10/05/21 0832 10/05/21 1005 10/05/21 1218 10/05/21 1309  BP: (!) 164/84  118/74   Pulse: (!) 116  (!) 114   Resp:      Temp: 98.1 F (36.7 C)  98.1 F (36.7 C)   TempSrc:  SpO2: (!) 88% 90% 93% (!) 88%  Weight:      Height:       Physical Exam HENT:     Head: Normocephalic.     Mouth/Throat:     Pharynx: No oropharyngeal exudate.  Eyes:     General: Lids are normal.     Conjunctiva/sclera: Conjunctivae normal.  Cardiovascular:     Rate and Rhythm: Tachycardia present. Rhythm irregularly irregular.      Heart sounds: Normal heart sounds, S1 normal and S2 normal.  Pulmonary:     Breath sounds: No decreased breath sounds, wheezing, rhonchi or rales.  Abdominal:     Palpations: Abdomen is soft.     Tenderness: There is no abdominal tenderness.  Musculoskeletal:     Right lower leg: Edema present.  Skin:    General: Skin is warm.     Comments: Chronic lower extremity skin discoloration right lower leg.  Neurological:     Mental Status: He is alert and oriented to person, place, and time.     Comments: Answers some simple questions.     Data Reviewed: Last 2 sugars 211 and 254  Family Communication: Spoke with wife at the bedside  Disposition: Status is: Inpatient Remains inpatient appropriate because: Postoperative day 2 for left lower extremity amputation Planned Discharge Destination: Rehab but no beds offered at this point   Author: Loletha Grayer, MD 10/05/2021 2:18 PM  For on call review [email or web address].

## 2021-10-05 NOTE — Progress Notes (Signed)
[facility]  Daily Progress Note   Subjective  -   Patient currently resting comfortable in bed.  He is oriented to self and family but unaware of his location or time.  Postoperative wound dressing in place but no signs symptoms of seeping.  Objective Vitals:   10/05/21 1218 10/05/21 1309 10/05/21 1950 10/05/21 2038  BP: 118/74  115/78 125/88  Pulse: (!) 114  71 (!) 112  Resp:   20   Temp: 98.1 F (36.7 C)  98.2 F (36.8 C)   TempSrc:      SpO2: 93% (!) 88% (!) 87%   Weight:      Height:        Intake/Output Summary (Last 24 hours) at 10/05/2021 2156 Last data filed at 10/05/2021 1812 Gross per 24 hour  Intake 470 ml  Output 2675 ml  Net -2205 ml    PULM  CTAB CV  RRR VASC  tender left below-knee amputation  Laboratory CBC    Component Value Date/Time   WBC 12.0 (H) 10/05/2021 0824   HGB 10.3 (L) 10/05/2021 0824   HGB 14.7 07/12/2011 0919   HCT 33.6 (L) 10/05/2021 0824   HCT 45.4 07/12/2011 0919   PLT 243 10/05/2021 0824   PLT 156 07/12/2011 0919    BMET    Component Value Date/Time   NA 140 10/05/2021 0824   NA 141 07/12/2011 0919   K 4.3 10/05/2021 0824   K 4.4 07/12/2011 0919   CL 102 10/05/2021 0824   CL 102 07/12/2011 0919   CO2 32 10/05/2021 0824   CO2 29 07/12/2011 0919   GLUCOSE 210 (H) 10/05/2021 0824   GLUCOSE 76 07/12/2011 0919   BUN 26 (H) 10/05/2021 0824   BUN 16 05/05/2013 1022   CREATININE 0.84 10/05/2021 0824   CREATININE 0.71 05/05/2013 1022   CALCIUM 8.1 (L) 10/05/2021 0824   CALCIUM 8.8 07/12/2011 0919   GFRNONAA >60 10/05/2021 0824   GFRNONAA >60 05/05/2013 1022   GFRAA >60 01/06/2020 1853   GFRAA >60 05/05/2013 1022    Assessment/Planning: POD #2 s/p left below-knee amputation  The patient is more awake and alert but fairly unoriented We will maintain heparin drip until we do his initial surgical dressing change which we plan to do on Friday.  If there are no wound issues with the stump, we will  plan to restart his Eliquis.  Kris Hartmann  10/05/2021, 9:56 PM

## 2021-10-05 NOTE — Evaluation (Signed)
Occupational Therapy Re-Evaluation Patient Details Name: [name]. MRN: [id number] DOB: 28-Nov-1955 Today's Date: 10/05/2021   History of Present Illness [name] is a 22yoM who [facility]21 c progression of Left chornic heel ulcer. MRI showed significant progressive osteomyelitis in the left calcaneus. PMH HTN, IDDM2, morbid obesity, depression, CAD s/p CABG 2006 and PCI to RCA in 12/2020, chronic B venous stasis on Unna boots, chronic L foot ulcer, COPD, OSA on CPAP nightly, ai-fib, PAD, s/p multiple toe amputations.  S/p L BKA on 10/03/21.   Clinical Impression   Patient presenting with decreased Ind in self care, balance, functional mobility/transfers, and endurance. Pt continues to have increased confusion. He was able to verbalize having recent amputation but reports "foot taken off" and that we are in [place]. Pt needing heavy total A to EOB and sits for ~ 7 minutes. Total A to return to bed and for repositioning with bed in trendelenburg. Pt likely will not be able to hop safely as he has no sensation past knee of R LE. OT sessions will focus on lateral scoots and transfers for self care tasks.Patient will benefit from acute OT to increase overall independence in the areas of ADLs, functional mobility, and safety awareness in order to safely discharge to next venue of care.      Recommendations for follow up therapy are one component of a multi-disciplinary discharge planning process, led by the attending physician.  Recommendations may be updated based on patient status, additional functional criteria and insurance authorization.   Follow Up Recommendations  Skilled nursing-short term rehab (<3 hours/day)    Assistance Recommended at Discharge Frequent or constant Supervision/Assistance  Patient can return home with the following A lot of help with bathing/dressing/bathroom;Assistance with cooking/housework;Help with stairs or ramp for entrance;Assist for  transportation;Two people to help with walking and/or transfers    Functional Status Assessment  Patient has had a recent decline in their functional status and demonstrates the ability to make significant improvements in function in a reasonable and predictable amount of time.  Equipment Recommendations  Other (comment) (defer to next venue of care)       Precautions / Restrictions Precautions Precautions: Fall Restrictions Weight Bearing Restrictions: Yes      Mobility Bed Mobility Overal bed mobility: Needs Assistance Bed Mobility: Rolling, Supine to Sit, Sit to Supine     Supine to sit: Total assist Sit to supine: Total assist        Transfers                   General transfer comment: deferred for safety      Balance Overall balance assessment: Needs assistance Sitting-balance support: Bilateral upper extremity supported   Sitting balance - Comments: pt with difficulty getting up to sitting and has right sided and posterior lean initially. Postural control: Posterior lean, Right lateral lean     Standing balance comment: deferred                           ADL either performed or assessed with clinical judgement   ADL Overall ADL's : Needs assistance/impaired                                       General ADL Comments: +2 assist for rolling at bed level and total A for hygiene and  LB clothing management     Vision Patient Visual Report: No change from baseline              Pertinent Vitals/Pain Pain Assessment Pain Assessment: No/denies pain Faces Pain Scale: Hurts little more Pain Location: generalized Pain Descriptors / Indicators: Discomfort Pain Intervention(s): Limited activity within patient's tolerance, Repositioned, Premedicated before session, Patient requesting pain meds-RN notified     Hand Dominance Right   Extremity/Trunk Assessment Upper Extremity Assessment Upper Extremity Assessment:  Generalized weakness   Lower Extremity Assessment Lower Extremity Assessment: Generalized weakness;RLE deficits/detail RLE Deficits / Details: decreased sensation past knee LLE Deficits / Details: Weakness from below-knee amputation.       Communication Communication Communication: No difficulties   Cognition Arousal/Alertness: Awake/alert Behavior During Therapy: Flat affect Overall Cognitive Status: Impaired/Different from baseline                                 General Comments: Pt reports he is in [place] but is able to verbalize correct surgical procedure and just general confusion.                Home Living Family/patient expects to be discharged to:: Private residence Living Arrangements: Spouse/significant other Available Help at Discharge: Family;Available PRN/intermittently Type of Home: House Home Access: Stairs to enter [employer] of Steps: 4 Entrance Stairs-Rails: Can reach both;Left;Right Home Layout: One level     Bathroom Shower/Tub: Tub/shower unit;Sponge bathes at baseline   Bathroom Toilet: Handicapped height     Home Equipment: Conservation officer, nature (2 wheels);Cane - quad;Shower seat - built in   Additional Comments: Bariatric RW; knee scooter      Prior Functioning/Environment Prior Level of Function : Independent/Modified Independent             Mobility Comments: Pt reports he has been ambulating with RW or using knee scooter (mostly in community) ADLs Comments: wife assists for LB dressing. He has been washing at sink to keep LE dry prior to admission. Wife assists with IADLs        OT Problem List: Decreased strength;Decreased activity tolerance;Impaired balance (sitting and/or standing);Decreased safety awareness;Cardiopulmonary status limiting activity;Decreased cognition      OT Treatment/Interventions: Self-care/ADL training;Balance training;Energy conservation;DME and/or AE instruction;Manual  therapy;Patient/family education;Therapeutic activities;Neuromuscular education;Therapeutic exercise    OT Goals(Current goals can be found in the care plan section) Acute Rehab OT Goals Patient Stated Goal: to get better OT Goal Formulation: With patient Time For Goal Achievement: 10/13/21 Potential to Achieve Goals: Good  OT Frequency: Min 2X/week       AM-PAC OT "6 Clicks" Daily Activity     Outcome Measure Help from another person eating meals?: None Help from another person taking care of personal grooming?: A Lot Help from another person toileting, which includes using toliet, bedpan, or urinal?: Total Help from another person bathing (including washing, rinsing, drying)?: Total Help from another person to put on and taking off regular upper body clothing?: A Lot Help from another person to put on and taking off regular lower body clothing?: Total 6 Click Score: 11   End of Session Equipment Utilized During Treatment: Oxygen Nurse Communication: Mobility status  Activity Tolerance: Patient tolerated treatment well Patient left: in bed;with call bell/phone within reach;with bed alarm set  OT Visit Diagnosis: Unsteadiness on feet (R26.81);Repeated falls (R29.6);Muscle weakness (generalized) (M62.81)  Time: 8416-6063 OT Time Calculation (min): 25 min Charges:  OT General Charges $OT Visit: 1 Visit OT Evaluation $OT Re-eval: 1 Re-eval OT Treatments $Therapeutic Activity: 8-22 mins  Darleen Crocker, MS, OTR/L , CBIS ascom [phone]  10/05/21, 4:28 PM

## 2021-10-06 ENCOUNTER — Ambulatory Visit: Payer: Medicare Other | Admitting: Physician Assistant

## 2021-10-06 ENCOUNTER — Inpatient Hospital Stay: Payer: Medicare Other

## 2021-10-06 DIAGNOSIS — R7881 Bacteremia: Secondary | ICD-10-CM | POA: Diagnosis not present

## 2021-10-06 DIAGNOSIS — B9562 Methicillin resistant Staphylococcus aureus infection as the cause of diseases classified elsewhere: Secondary | ICD-10-CM | POA: Diagnosis not present

## 2021-10-06 DIAGNOSIS — J9622 Acute and chronic respiratory failure with hypercapnia: Secondary | ICD-10-CM

## 2021-10-06 DIAGNOSIS — A4102 Sepsis due to Methicillin resistant Staphylococcus aureus: Secondary | ICD-10-CM | POA: Diagnosis not present

## 2021-10-06 DIAGNOSIS — M86672 Other chronic osteomyelitis, left ankle and foot: Secondary | ICD-10-CM | POA: Diagnosis not present

## 2021-10-06 DIAGNOSIS — J9621 Acute and chronic respiratory failure with hypoxia: Secondary | ICD-10-CM

## 2021-10-06 DIAGNOSIS — G4733 Obstructive sleep apnea (adult) (pediatric): Secondary | ICD-10-CM

## 2021-10-06 DIAGNOSIS — I5033 Acute on chronic diastolic (congestive) heart failure: Secondary | ICD-10-CM | POA: Diagnosis not present

## 2021-10-06 DIAGNOSIS — R410 Disorientation, unspecified: Secondary | ICD-10-CM | POA: Diagnosis not present

## 2021-10-06 LAB — HEPARIN LEVEL (UNFRACTIONATED): Heparin Unfractionated: 0.29 IU/mL — ABNORMAL LOW (ref 0.30–0.70)

## 2021-10-06 LAB — BLOOD GAS, ARTERIAL
Acid-Base Excess: 14 mmol/L — ABNORMAL HIGH (ref 0.0–2.0)
Bicarbonate: 41.5 mmol/L — ABNORMAL HIGH (ref 20.0–28.0)
O2 Content: 4 L/min
O2 Saturation: 94.6 %
Patient temperature: 37
pCO2 arterial: 64 mmHg — ABNORMAL HIGH (ref 32–48)
pH, Arterial: 7.42 (ref 7.35–7.45)
pO2, Arterial: 64 mmHg — ABNORMAL LOW (ref 83–108)

## 2021-10-06 LAB — GLUCOSE, CAPILLARY
Glucose-Capillary: 118 mg/dL — ABNORMAL HIGH (ref 70–99)
Glucose-Capillary: 123 mg/dL — ABNORMAL HIGH (ref 70–99)
Glucose-Capillary: 162 mg/dL — ABNORMAL HIGH (ref 70–99)
Glucose-Capillary: 203 mg/dL — ABNORMAL HIGH (ref 70–99)

## 2021-10-06 LAB — CBC
HCT: 30.7 % — ABNORMAL LOW (ref 39.0–52.0)
Hemoglobin: 9 g/dL — ABNORMAL LOW (ref 13.0–17.0)
MCH: 27 pg (ref 26.0–34.0)
MCHC: 29.3 g/dL — ABNORMAL LOW (ref 30.0–36.0)
MCV: 92.2 fL (ref 80.0–100.0)
Platelets: 247 10*3/uL (ref 150–400)
RBC: 3.33 MIL/uL — ABNORMAL LOW (ref 4.22–5.81)
RDW: 16.8 % — ABNORMAL HIGH (ref 11.5–15.5)
WBC: 12.5 10*3/uL — ABNORMAL HIGH (ref 4.0–10.5)
nRBC: 0 % (ref 0.0–0.2)

## 2021-10-06 LAB — BASIC METABOLIC PANEL
Anion gap: 4 — ABNORMAL LOW (ref 5–15)
BUN: 24 mg/dL — ABNORMAL HIGH (ref 8–23)
CO2: 35 mmol/L — ABNORMAL HIGH (ref 22–32)
Calcium: 8.1 mg/dL — ABNORMAL LOW (ref 8.9–10.3)
Chloride: 104 mmol/L (ref 98–111)
Creatinine, Ser: 0.79 mg/dL (ref 0.61–1.24)
GFR, Estimated: >60 mL/min (ref >60)
Glucose, Bld: 133 mg/dL — ABNORMAL HIGH (ref 70–99)
Potassium: 4.3 mmol/L (ref 3.5–5.1)
Sodium: 143 mmol/L (ref 135–145)

## 2021-10-06 MED ORDER — INSULIN GLARGINE-YFGN 100 UNIT/ML ~~LOC~~ SOLN
12.0000 [IU] | Freq: Two times a day (BID) | SUBCUTANEOUS | Status: DC
  Administered 2021-10-06: 12 [IU] via SUBCUTANEOUS
  Filled 2021-10-06 (×2): qty 0.12

## 2021-10-06 MED ORDER — INSULIN ASPART 100 UNIT/ML IJ SOLN
4.0000 [IU] | Freq: Three times a day (TID) | INTRAMUSCULAR | Status: DC
Start: 2021-10-06 — End: 2021-10-08
  Administered 2021-10-07 – 2021-10-08 (×5): 4 [IU] via SUBCUTANEOUS
  Filled 2021-10-06 (×5): qty 1

## 2021-10-06 MED ORDER — HEPARIN BOLUS VIA INFUSION
2000.0000 [IU] | Freq: Once | INTRAVENOUS | Status: AC
  Administered 2021-10-06: 2000 [IU] via INTRAVENOUS
  Filled 2021-10-06: qty 2000

## 2021-10-06 MED ORDER — QUETIAPINE FUMARATE 25 MG PO TABS
50.0000 mg | ORAL_TABLET | Freq: Every day | ORAL | Status: DC
  Administered 2021-10-06: 50 mg via ORAL
  Filled 2021-10-06: qty 2

## 2021-10-06 MED ORDER — APIXABAN 5 MG PO TABS
5.0000 mg | ORAL_TABLET | Freq: Two times a day (BID) | ORAL | Status: DC
  Administered 2021-10-06 – 2021-11-13 (×75): 5 mg via ORAL
  Filled 2021-10-06 (×79): qty 1

## 2021-10-06 NOTE — Progress Notes (Signed)
[facility] Cardiology  Patient ID: [name]. MRN: [id number] DOB/AGE: 67-Jan-1957 67 y.o.   Admit date: 09/28/2021 Referring Physician Dr. Francine Graven Primary Physician Dr. Felipa Furnace Primary Cardiologist Dr. Donnelly Angelica Reason for Consultation acute on chronic HFpEF    HPI: [name]. is a [phone] with a PMH significant for CAD s/p CABG 2006 and PCI of RCA 01/2020, HFpEF (LVEF 50-55% 04/2021), A. fib / flutter on apixaban, amiodarone and digoxin, HTN, BPH, morbid obesity, poorly controlled type 2 diabetes,  PAD s/p left third toe amputation, chronic venous stasis on Unna boots, OSA on CPAP who presented to [facility] ED on 09/28/21 with increasing pain and swelling in his left leg, and imaging concerning for worsening osteomyelitis of his left foot. He has has onoging infections needing wound debridement in his left lower leg over the past several months. Cardiology is consulted for assistance with his heart failure.    Interval History:  -diuresed well again overnight -sleeping on Bipap this morning -no acute events    Vitals:   10/06/21 0400 10/06/21 0432 10/06/21 0500 10/06/21 0911  BP: 108/72  105/68 122/84  Pulse: (!) 111  (!) 111 (!) 109  Resp:      Temp: 98.2 F (36.8 C)   98.4 F (36.9 C)  TempSrc: Oral     SpO2: 96%  92% 94%  Weight:  (!) 170.1 kg    Height:         Intake/Output Summary (Last 24 hours) at 10/06/2021 1023 Last data filed at 10/06/2021 0600 Gross per 24 hour  Intake 1267.12 ml  Output 2475 ml  Net -1207.88 ml    PHYSICAL EXAM General: Ill-appearing Caucasian male,  in no acute distress. Sitting upright in PCU bed, resting on BIPAP without family at bedside.  HEENT:  Normocephalic and atraumatic. Neck:   No JVD.  Lungs: Normal respiratory effort on 2L by [place]. Clear bilaterally to auscultation anteriorly. No wheezes, crackles, rhonchi.  Heart: Tachy but regular. Normal S1 and S2 without gallops or murmurs. Abdomen: Obese appearing.  Msk: Normal  strength and tone for age. Extremities:  Significant bilateral lower extremity edema, RLE with venous stasis changes and woody appearance to skin, improving slightly with Wood tightness Left leg wrapped in ace bandage s/p BKA  Neuro: sleeping on BIPAP Psych: sleeping on BIPAP   LABS: Basic Metabolic Panel: Recent Labs    10/04/21 0621 10/05/21 0824 10/06/21 0605  NA 142 140 143  K 4.9 4.3 4.3  CL 106 102 104  CO2 32 32 35*  GLUCOSE 154* 210* 133*  BUN 34* 26* 24*  CREATININE 0.97 0.84 0.79  CALCIUM 8.0* 8.1* 8.1*  MG 1.9  --   --     Liver Function Tests: No results for input(s): "AST", "ALT", "ALKPHOS", "BILITOT", "PROT", "ALBUMIN" in the last 72 hours.  No results for input(s): "LIPASE", "AMYLASE" in the last 72 hours. CBC: Recent Labs    10/05/21 0824 10/06/21 0605  WBC 12.0* 12.5*  HGB 10.3* 9.0*  HCT 33.6* 30.7*  MCV 90.6 92.2  PLT 243 247    Cardiac Enzymes: No results for input(s): "CKTOTAL", "CKMB", "CKMBINDEX", "TROPONINI" in the last 72 hours.  BNP: Invalid input(s): "POCBNP" D-Dimer: No results for input(s): "DDIMER" in the last 72 hours. Hemoglobin A1C: No results for input(s): "HGBA1C" in the last 72 hours. Fasting Lipid Panel: No results for input(s): "CHOL", "HDL", "LDLCALC", "TRIG", "CHOLHDL", "LDLDIRECT" in the last 72 hours. Thyroid Function Tests: No results for  input(s): "TSH", "T4TOTAL", "T3FREE", "THYROIDAB" in the last 72 hours.  Invalid input(s): "FREET3" Anemia Panel: No results for input(s): "VITAMINB12", "FOLATE", "FERRITIN", "TIBC", "IRON", "RETICCTPCT" in the last 72 hours.  ECHOCARDIOGRAM COMPLETE  Result Date: 10/05/2021    ECHOCARDIOGRAM REPORT   Patient Name:   [name]. Date of Exam: 10/05/2021 Medical Rec #:  [id number]             Height:       76.0 in Accession #:    [id number]            Weight:       425.0 lb Date of Birth:  02-23-1956            BSA:          3.052 m Patient Age:    66 years              BP:            125/74 mmHg Patient Gender: M                     HR:           111 bpm. Exam Location:  [facility] Procedure: 2D Echo, Cardiac Doppler and Color Doppler Indications:     bacteremia R78.81  History:         Patient has prior history of Echocardiogram examinations, most                  recent 05/10/2021. CHF, Previous Myocardial Infarction, COPD;                  Risk Factors:Hypertension.  Sonographer:     [name] Sport Referring Phys:  [id number] Tsosie Billing Diagnosing Phys: Serafina Royals MD  Sonographer Comments: Suboptimal apical window. IMPRESSIONS  1. Left ventricular ejection fraction, by estimation, is 55 to 60%. The left ventricle has normal function. The left ventricle has no regional wall motion abnormalities. Left ventricular diastolic parameters were normal.  2. Right ventricular systolic function is normal. The right ventricular size is normal.  3. Left atrial size was mildly dilated.  4. Right atrial size was mildly dilated.  5. Thidkcened leaflets without obvious evidence of vegetations. The mitral valve is normal in structure. Mild mitral valve regurgitation.  6. Thickened leaflets without evidence of obvious vegetaion     . The aortic valve is normal in structure. Aortic valve regurgitation is trivial. FINDINGS  Left Ventricle: Left ventricular ejection fraction, by estimation, is 55 to 60%. The left ventricle has normal function. The left ventricle has no regional wall motion abnormalities. The left ventricular internal cavity size was normal in size. There is  no left ventricular hypertrophy. Left ventricular diastolic parameters were normal. Right Ventricle: The right ventricular size is normal. No increase in right ventricular wall thickness. Right ventricular systolic function is normal. Left Atrium: Left atrial size was mildly dilated. Right Atrium: Right atrial size was mildly dilated. Pericardium: There is no evidence of pericardial effusion. Mitral Valve: Thidkcened leaflets without  obvious evidence of vegetations. The mitral valve is normal in structure. Mild mitral valve regurgitation. MV peak gradient, 10.4 mmHg. The mean mitral valve gradient is 5.0 mmHg. Tricuspid Valve: The tricuspid valve is normal in structure. Tricuspid valve regurgitation is mild. Aortic Valve: Thickened leaflets without evidence of obvious vegetaion. The aortic valve is normal in structure. Aortic valve regurgitation is trivial. Aortic valve mean gradient measures 5.0 mmHg. Aortic valve peak gradient measures  8.1 mmHg. Aortic valve area, by VTI measures 2.97 cm. Pulmonic Valve: The pulmonic valve was normal in structure. Pulmonic valve regurgitation is trivial. Aorta: The aortic root and ascending aorta are structurally normal, with no evidence of dilitation. IAS/Shunts: No atrial level shunt detected by color flow Doppler.  LEFT VENTRICLE PLAX 2D LVIDd:         5.40 cm LVIDs:         5.10 cm LV PW:         1.20 cm LV IVS:        1.25 cm LVOT diam:     2.20 cm LV SV:         65 LV SV Index:   21 LVOT Area:     3.80 cm  RIGHT VENTRICLE RV Basal diam:  5.90 cm RV S prime:     12.70 cm/s TAPSE (M-mode): 2.1 cm LEFT ATRIUM              Index        RIGHT ATRIUM           Index LA diam:        4.90 cm  1.61 cm/m   RA Area:     33.60 cm LA Vol (A2C):   131.0 ml 42.93 ml/m  RA Volume:   142.00 ml 46.53 ml/m LA Vol (A4C):   121.0 ml 39.65 ml/m LA Biplane Vol: 132.0 ml 43.25 ml/m  AORTIC VALVE                     PULMONIC VALVE AV Area (Vmax):    2.29 cm      PV Vmax:        0.70 m/s AV Area (Vmean):   1.99 cm      PV Vmean:       48.000 cm/s AV Area (VTI):     2.97 cm      PV VTI:         0.091 m AV Vmax:           142.00 cm/s   PV Peak grad:   2.0 mmHg AV Vmean:          112.000 cm/s  PV Mean grad:   1.0 mmHg AV VTI:            0.219 m       RVOT Peak grad: 4 mmHg AV Peak Grad:      8.1 mmHg AV Mean Grad:      5.0 mmHg LVOT Vmax:         85.50 cm/s LVOT Vmean:        58.600 cm/s LVOT VTI:          0.171 m LVOT/AV  VTI ratio: 0.78  AORTA Ao Root diam: 3.50 cm MITRAL VALVE                TRICUSPID VALVE MV Area (PHT): 5.02 cm     TR Peak grad:   11.7 mmHg MV Area VTI:   2.09 cm     TR Vmax:        171.00 cm/s MV Peak grad:  10.4 mmHg MV Mean grad:  5.0 mmHg     SHUNTS MV Vmax:       1.61 m/s     Systemic VTI:  0.17 m MV Vmean:      97.0 cm/s    Systemic Diam: 2.20 cm MV Decel Time: 151 msec  Pulmonic VTI:  0.090 m MV E velocity: 146.00 cm/s Serafina Royals MD Electronically signed by Serafina Royals MD Signature Date/Time: 10/05/2021/1:26:20 PM    Final      Echo LVEF 50-55% 05/10/2021  TELEMETRY: Atrial fibrillation 111 bpm:  ASSESSMENT AND PLAN:  Principal Problem:   Sepsis due to methicillin resistant Staphylococcus aureus (MRSA) ([place]) Active Problems:   Essential hypertension, benign   RLS (restless legs syndrome)   Osteomyelitis (HCC)   Obesity, Class III, BMI 40-49.9 (morbid obesity) (HCC)   Acute on chronic diastolic CHF (congestive heart failure) (HCC)   AF (paroxysmal atrial fibrillation) ([place])   Uncontrolled type 2 diabetes mellitus with hyperglycemia, with long-term current use of insulin (HCC)   Malnutrition of moderate degree   Generalized weakness   Acute metabolic encephalopathy   Acute respiratory failure with hypoxia and hypercapnia (HCC)   OSA (obstructive sleep apnea)   Acute delirium   Acute on chronic respiratory failure with hypoxia and hypercapnia ([place])    1. Acute on chronic diastolic congestive heart failure, HFpEF, LVEF 50-55% 05/10/2021, good diuresis after IV furosemide but remains volume up and on supplemental O2 2.  Paroxysmal atrial fibrillation/atrial flutter, currently in atrial flutter heart rate 115 bpm, on amiodarone and metoprolol for rate control and Eliquis for stroke prevention, cardizem '360mg'$  CD 3.  Coronary artery disease, status post CABG 2006, PCI RCA 01/2020, currently without chest pain 4.  Left foot osteomyelitis on IV vancomycin, left BKA 6/6 with Dr.  Delana Meyer 5.  Acute kidney injury, improving to Cr 1.03 and >60  6.  MRSA bacteremia, osteomyelitis of L foot likely source, on IV daptomycin, repeat echo ordered by ID, resulted with thickened aortic and mitral leaflets without obvious vegetation    Recommendations   1.  Agree with current therapy 2.  Continue IV lasix '40mg'$  BID, BMP daily  3.  Continue heparin, hold eliquis for now, vascular surgery to reassess wound on 6/9 and hopefully restart therafter 4.  Continue amiodarone, metoprolol tartrate, and cardizem for rate and rhythm control 5.  S/p Left BKA 6/6  Dr. Humphrey Rolls will continue to follow through the weekend, please contact him with further questions if needed.    This patient's plan of care was discussed and created with Dr. Nehemiah Massed and he is in agreement.    Tristan Schroeder, PA-C 10/06/2021 10:23 AM

## 2021-10-06 NOTE — Progress Notes (Signed)
Progress Note   Patient: [name]. JOA:[id number] DOB: [birth year] DOA: 09/28/2021     8 DOS: the patient was seen and examined on 10/06/2021     Assessment and Plan: * Sepsis due to methicillin resistant Staphylococcus aureus (MRSA) ([place]) MRSA growing out of blood cultures on 09/28/2021.  Repeat blood cultures so far negative for 4 days.  Currently on daptomycin.  Source of infection likely osteomyelitis.  Will need 4 weeks total of antibiotics.  Acute delirium Patient lethargic today, likely because he had a rough night last night.  BiPAP placed while he was sleeping today.  We will continue Seroquel at night increase dose to 50 mg and hopefully will have a better day tomorrow.  We will get a CT scan of the head.  Osteomyelitis [facility]) Patient has an ulcer over his left heel and is status post recent left heel partial calcanectomy. Status post left BKA on 10/03/2021. Patient on IV daptomycin.    Acute on chronic diastolic CHF (congestive heart failure) ([place]) Patient's last known LVEF is 55 to 60% from a 2D echocardiogram which was done 01/23 Continue Lasix IV.  Patient also on metoprolol.  Uncontrolled type 2 diabetes mellitus with hyperglycemia, with long-term current use of insulin (HCC) Last hemoglobin A1c in the chart of 10.2.  Patient on Semglee insulin 25 units twice a day.  Since the patient did not eat much today will decrease Semglee insulin down to 12 units twice a day.  Acute on chronic respiratory failure with hypoxia and hypercapnia (HCC) The patient needs BiPAP at night in order to have a better mental status.  We will obtain an ABG.  AF (paroxysmal atrial fibrillation) (HCC) Continue metoprolol, Cardizem and amiodarone for rate control.  Heparin drip to be switched over to Eliquis.  Obesity, Class III, BMI 40-49.9 (morbid obesity) ([place]) Patient will need a new weight postoperatively.  Essential hypertension, benign Continue metoprolol and diltiazem and  Lasix  RLS (restless legs syndrome) Continue pramipexole  OSA (obstructive sleep apnea) Usually uses CPAP at night but will convert to BiPAP with CO2 retention.        Subjective: Patient seen and was on BiPAP.  He opens up his eyes to stimulation but went right back to sleep.  Apparently had a rough night and was agitated.  They did not have the BiPAP on him because he had mittens on.  Physical Exam: Vitals:   10/06/21 0432 10/06/21 0500 10/06/21 0911 10/06/21 1211  BP:  105/68 122/84 124/75  Pulse:  (!) 111 (!) 109 (!) 101  Resp:      Temp:   98.4 F (36.9 C) 98.3 F (36.8 C)  TempSrc:    Axillary  SpO2:  92% 94% 94%  Weight: (!) 170.1 kg     Height:       Physical Exam HENT:     Head: Normocephalic.     Mouth/Throat:     Comments: Unable to look in mouth today. Eyes:     General: Lids are normal.     Conjunctiva/sclera: Conjunctivae normal.  Cardiovascular:     Rate and Rhythm: Tachycardia present. Rhythm irregularly irregular.     Heart sounds: Normal heart sounds, S1 normal and S2 normal.  Pulmonary:     Breath sounds: No decreased breath sounds, wheezing, rhonchi or rales.  Abdominal:     Palpations: Abdomen is soft.     Tenderness: There is no abdominal tenderness.  Musculoskeletal:     Right lower leg: Edema  present.  Skin:    General: Skin is warm.     Findings: No rash.     Comments: Chronic lower extremity skin discoloration right lower leg.  Neurological:     Mental Status: He is oriented to person, place, and time. He is lethargic.     Comments: Answers some simple questions.     Data Reviewed: Creatinine 0.79, hemoglobin 9.0, white blood cell count 12.5  Family Communication: Updated wife on the phone  Disposition: Status is: Inpatient Remains inpatient appropriate because: Physical therapy recommending rehab and we do not have a place.  Switching from heparin drip over to Eliquis today.  Patient more lethargic today and getting a CT scan  of the head and ABG. Planned Discharge Destination: Rehab   Author: Loletha Grayer, MD 10/06/2021 3:13 PM  For on call review [email or web address].

## 2021-10-06 NOTE — Progress Notes (Addendum)
ID Pt was lethargic and obtuneded and placed on bipap this morning More alert  BP 124/75 (BP Location: Right Arm)   Pulse (!) 101   Temp 98.3 F (36.8 C) (Axillary)   Resp 20   Ht '6\' 4"'$  (1.93 m)   Wt (!) 170.1 kg   SpO2 94%   BMI 45.66 kg/m   Somnolent On BIPAP Chest b/l air entry Tachycardia Left BKA- site picture reviewed Clean    Labs    Latest Ref Rng & Units 10/06/2021    6:05 AM 10/05/2021    8:24 AM 10/04/2021    6:21 AM  CBC  WBC 4.0 - 10.5 K/uL 12.5  12.0  9.5   Hemoglobin 13.0 - 17.0 g/dL 9.0  10.3  9.7   Hematocrit 39.0 - 52.0 % 30.7  33.6  33.1   Platelets 150 - 400 K/uL 247  243  236        Latest Ref Rng & Units 10/06/2021    6:05 AM 10/05/2021    8:24 AM 10/04/2021    6:21 AM  CMP  Glucose 70 - 99 mg/dL 133  210  154   BUN 8 - 23 mg/dL 24  26  34   Creatinine 0.61 - 1.24 mg/dL 0.79  0.84  0.97   Sodium 135 - 145 mmol/L 143  140  142   Potassium 3.5 - 5.1 mmol/L 4.3  4.3  4.9   Chloride 98 - 111 mmol/L 104  102  106   CO2 22 - 32 mmol/L 35  32  32   Calcium 8.9 - 10.3 mg/dL 8.1  8.1  8.0        Impression/recommendation MRSA bacteremia secondary to left heel ulcer with underlying osteomyelitis. Low bioburden 1 of 4 Repeat blood culture neg MRSA in wound culture as well Patient is currently Daptomycin Underwent BKA  2 d echo no vegetation- Mitral valve is thickened,no vegetation Will need IV antibiotic for minimum 4 weeks 11/02/21  Encephalopathy.  Multifactorial--predominantly due to CO2 narcosis/ . On intermittent BIPAP  Severe venous edema of both legs  AKI resolved  Paroxysmal A-fib with atrial flutter.  On amiodarone metoprolol and Eliquis  Coronary artery disease status post CABG  Diastolic CHF started on furosemide  Discussed the management with the hospitalist Will follow him peripherally this weekend- call if needed

## 2021-10-06 NOTE — Consult Note (Signed)
ANTICOAGULATION CONSULT NOTE  Pharmacy Consult for IV Heparin Indication: atrial fibrillation  Patient Measurements: Height: '6\' 4"'$  (193 cm) Weight: (!) 170.1 kg (375 lb 1.6 oz) IBW/kg (Calculated) : 86.8 Heparin Dosing Weight: 130.7 kg  Labs: Recent Labs    10/04/21 0621 10/05/21 0020 10/05/21 0824 10/05/21 1012 10/05/21 2201 10/06/21 0605  HGB 9.7*  --  10.3*  --   --  9.0*  HCT 33.1*  --  33.6*  --   --  30.7*  PLT 236  --  243  --   --  247  HEPARINUNFRC  --    < >  --  0.30 0.25* 0.29*  CREATININE 0.97  --  0.84  --   --  0.79   < > = values in this interval not displayed.     Estimated Creatinine Clearance: 154.3 mL/min (by C-G formula based on SCr of 0.79 mg/dL).   Medical History: Past Medical History:  Diagnosis Date   Arrhythmia    atrial fibrillation   Asthma    CHF (congestive heart failure) (HCC)    COPD (chronic obstructive pulmonary disease) (HCC)    Coronary artery disease    Depression    Diabetes mellitus without complication (HCC)    Gout    History anabolic steroid use    Hyperlipidemia    Hypertension    Hypogonadism in male    Morbid obesity ([place])    Myocardial infarction ([place])    Peripheral vascular disease (HCC)    Perirectal abscess    Pleurisy    Sleep apnea    CPAP at night, no oxygen   Varicella   Heparin Dosing Weight: 130.7 kg  Medications:  Apixaban 5 mg BID (last dose 6/3 at 0933)   Assessment: Patient is a 66 y/o M with medical history as above and including Afib on apixaban who is admitted with left foot ulcer / left foot OM. Vascular surgery following and plan is for L BKA vs AKA pending scheduling. Apixaban is on hold for impending procedure. Pharmacy consulted to initiate and manage heparin infusion peri-operatively for Afib. Vascular surgery to evaluate on Friday to determine whether Eliquis can be restarted  CBC notable for stable anemia. Baseline aPTT pending. Baseline INR 1.3.    Goal of Therapy:  Heparin  level 0.3-0.7 units/ml aPTT 66 - 102 seconds Monitor platelets by anticoagulation protocol: Yes   Plan:  6/9: HL @ 0605 = 0.29, SUBtherapeutic  Will order heparin 2000 units IV X 1 bolus and increase drip rate to 3200 units/hr.  Will recheck HL 6 hrs after rate change.   [place] D Clinical Pharmacist 10/06/2021

## 2021-10-06 NOTE — Progress Notes (Signed)
[facility]  Daily Progress Note   Subjective  -   Patient resting comfortably with BiPAP.  Postoperative dressing removed.  Patient denied any discomfort.  Objective Vitals:   10/06/21 0432 10/06/21 0500 10/06/21 0911 10/06/21 1211  BP:  105/68 122/84 124/75  Pulse:  (!) 111 (!) 109 (!) 101  Resp:      Temp:   98.4 F (36.9 C) 98.3 F (36.8 C)  TempSrc:    Axillary  SpO2:  92% 94% 94%  Weight: (!) 170.1 kg     Height:        Intake/Output Summary (Last 24 hours) at 10/06/2021 1430 Last data filed at 10/06/2021 0600 Gross per 24 hour  Intake 1027.12 ml  Output 700 ml  Net 327.12 ml    PULM  CTAB CV  RRR VASC  wound is clean and dry see attached picture     Laboratory CBC    Component Value Date/Time   WBC 12.5 (H) 10/06/2021 0605   HGB 9.0 (L) 10/06/2021 0605   HGB 14.7 07/12/2011 0919   HCT 30.7 (L) 10/06/2021 0605   HCT 45.4 07/12/2011 0919   PLT 247 10/06/2021 0605   PLT 156 07/12/2011 0919    BMET    Component Value Date/Time   NA 143 10/06/2021 0605   NA 141 07/12/2011 0919   K 4.3 10/06/2021 0605   K 4.4 07/12/2011 0919   CL 104 10/06/2021 0605   CL 102 07/12/2011 0919   CO2 35 (H) 10/06/2021 0605   CO2 29 07/12/2011 0919   GLUCOSE 133 (H) 10/06/2021 0605   GLUCOSE 76 07/12/2011 0919   BUN 24 (H) 10/06/2021 0605   BUN 16 05/05/2013 1022   CREATININE 0.79 10/06/2021 0605   CREATININE 0.71 05/05/2013 1022   CALCIUM 8.1 (L) 10/06/2021 0605   CALCIUM 8.8 07/12/2011 0919   GFRNONAA >60 10/06/2021 0605   GFRNONAA >60 05/05/2013 1022   GFRAA >60 01/06/2020 1853   GFRAA >60 05/05/2013 1022    Assessment/Planning: POD #3 s/p L BKA   Patient fairly fatigued today but the wound is clean dry and intact.  He had significant moisture under his dressing and it is felt that the wound would be left best mostly open to air.  We will recommend cleaning the wound with Betadine daily and cover with an ABD.  No hematoma noted.   Patient is able to restart his Eliquis this evening and stop heparin drip.   Kris Hartmann  10/06/2021, 2:30 PM

## 2021-10-06 NOTE — Progress Notes (Signed)
PT Cancellation Note  Patient Details Name: [name]. MRN: [id number] DOB: 1956-01-13   Cancelled Treatment:    Reason Eval/Treat Not Completed: Other (comment) Pt on BiPAP resting. Will check back as schedule allows.    Markcus Lazenby A Ebrima Ranta 10/06/2021, 12:02 PM

## 2021-10-07 DIAGNOSIS — R410 Disorientation, unspecified: Secondary | ICD-10-CM | POA: Diagnosis not present

## 2021-10-07 DIAGNOSIS — E1165 Type 2 diabetes mellitus with hyperglycemia: Secondary | ICD-10-CM | POA: Diagnosis not present

## 2021-10-07 DIAGNOSIS — I5033 Acute on chronic diastolic (congestive) heart failure: Secondary | ICD-10-CM | POA: Diagnosis not present

## 2021-10-07 DIAGNOSIS — A4102 Sepsis due to Methicillin resistant Staphylococcus aureus: Secondary | ICD-10-CM | POA: Diagnosis not present

## 2021-10-07 DIAGNOSIS — R531 Weakness: Secondary | ICD-10-CM

## 2021-10-07 DIAGNOSIS — D649 Anemia, unspecified: Secondary | ICD-10-CM | POA: Diagnosis present

## 2021-10-07 LAB — CULTURE, BLOOD (ROUTINE X 2)
Culture: NO GROWTH
Culture: NO GROWTH
Special Requests: ADEQUATE
Special Requests: ADEQUATE

## 2021-10-07 LAB — CBC
HCT: 28.5 % — ABNORMAL LOW (ref 39.0–52.0)
Hemoglobin: 8.3 g/dL — ABNORMAL LOW (ref 13.0–17.0)
MCH: 27.5 pg (ref 26.0–34.0)
MCHC: 29.1 g/dL — ABNORMAL LOW (ref 30.0–36.0)
MCV: 94.4 fL (ref 80.0–100.0)
Platelets: 252 10*3/uL (ref 150–400)
RBC: 3.02 MIL/uL — ABNORMAL LOW (ref 4.22–5.81)
RDW: 16.8 % — ABNORMAL HIGH (ref 11.5–15.5)
WBC: 6.6 10*3/uL (ref 4.0–10.5)
nRBC: 0 % (ref 0.0–0.2)

## 2021-10-07 LAB — BASIC METABOLIC PANEL
Anion gap: 4 — ABNORMAL LOW (ref 5–15)
BUN: 23 mg/dL (ref 8–23)
CO2: 36 mmol/L — ABNORMAL HIGH (ref 22–32)
Calcium: 8.1 mg/dL — ABNORMAL LOW (ref 8.9–10.3)
Chloride: 101 mmol/L (ref 98–111)
Creatinine, Ser: 0.79 mg/dL (ref 0.61–1.24)
GFR, Estimated: >60 mL/min (ref >60)
Glucose, Bld: 151 mg/dL — ABNORMAL HIGH (ref 70–99)
Potassium: 4.3 mmol/L (ref 3.5–5.1)
Sodium: 141 mmol/L (ref 135–145)

## 2021-10-07 LAB — GLUCOSE, CAPILLARY
Glucose-Capillary: 144 mg/dL — ABNORMAL HIGH (ref 70–99)
Glucose-Capillary: 219 mg/dL — ABNORMAL HIGH (ref 70–99)
Glucose-Capillary: 238 mg/dL — ABNORMAL HIGH (ref 70–99)
Glucose-Capillary: 169 mg/dL — ABNORMAL HIGH (ref 70–99)

## 2021-10-07 MED ORDER — MORPHINE SULFATE (PF) 2 MG/ML IV SOLN: 2.0000 mg | INTRAVENOUS | Status: DC | PRN

## 2021-10-07 MED ORDER — QUETIAPINE FUMARATE 25 MG PO TABS
37.5000 mg | ORAL_TABLET | Freq: Every day | ORAL | Status: DC
  Administered 2021-10-07 – 2021-10-08 (×2): 37.5 mg via ORAL
  Filled 2021-10-07 (×2): qty 2

## 2021-10-07 MED ORDER — OXYCODONE HCL 5 MG PO TABS: 10.0000 mg | ORAL_TABLET | ORAL | Status: DC | PRN

## 2021-10-07 MED ORDER — INSULIN GLARGINE-YFGN 100 UNIT/ML ~~LOC~~ SOLN
18.0000 [IU] | Freq: Two times a day (BID) | SUBCUTANEOUS | Status: DC
  Administered 2021-10-07 – 2021-10-08 (×3): 18 [IU] via SUBCUTANEOUS
  Filled 2021-10-07 (×4): qty 0.18

## 2021-10-07 MED ORDER — DILTIAZEM HCL ER COATED BEADS 120 MG PO CP24
240.0000 mg | ORAL_CAPSULE | Freq: Every day | ORAL | Status: DC
  Administered 2021-10-08 – 2021-10-15 (×8): 240 mg via ORAL
  Filled 2021-10-07 (×8): qty 2

## 2021-10-07 MED ORDER — OXYCODONE-ACETAMINOPHEN 5-325 MG PO TABS: 1.0000 | ORAL_TABLET | ORAL | Status: DC | PRN

## 2021-10-07 MED ORDER — FINASTERIDE 5 MG PO TABS
5.0000 mg | ORAL_TABLET | Freq: Every day | ORAL | Status: DC
  Administered 2021-10-07 – 2021-11-13 (×38): 5 mg via ORAL
  Filled 2021-10-07 (×38): qty 1

## 2021-10-07 NOTE — Progress Notes (Signed)
Called with urinary retention.  Add Proscar to the patient's Flomax.  In and out catheterization.  This also could be contributing to delirium.  Diltiazem can also contribute to urinary retention and will titrate down the dose to 240 mg for tomorrow.  If blood pressure allows may be able to go up on the metoprolol tomorrow.  Dr. Leslye Peer

## 2021-10-07 NOTE — Progress Notes (Signed)
Progress Note   Patient: [name]. CWC:[id number] DOB: Nov 22, 1955 DOA: 09/28/2021     9 DOS: the patient was seen and examined on 10/07/2021     Assessment and Plan: * Sepsis due to methicillin resistant Staphylococcus aureus (MRSA) ([place]) MRSA growing out of blood cultures on 09/28/2021.  Repeat blood cultures so far negative for 4 days.  Currently on daptomycin.  Source of infection likely osteomyelitis.  Will need 4 weeks total of antibiotics.  Acute delirium Patient slept with the BiPAP on.  Mental status better today than yesterday.  CT scan of the head and ABG reviewed from yesterday.  PCO2 of 64.  Continue to use BiPAP at night.  We will decrease dose of Seroquel at night down to 37.5 mg.  Osteomyelitis ([place]) Status post left BKA on 10/03/2021. Patient on IV daptomycin.    Acute on chronic diastolic CHF (congestive heart failure) ([place]) Patient's last known LVEF is 55 to 60% from a 2D echocardiogram which was done 01/23. Continue Lasix IV.  Patient also on metoprolol.  Uncontrolled type 2 diabetes mellitus with hyperglycemia, with long-term current use of insulin (HCC) Last hemoglobin A1c in the chart of 10.2.  Since patient eating breakfast I will increase Semglee insulin to 18 units twice a day.  Continue short acting insulin prior to meals plus sliding scale.  Acute on chronic respiratory failure with hypoxia and hypercapnia (HCC) The patient needs BiPAP at night in order to have a better mental status.  With the redness on the nose asked nursing staff to place something on his nose while the BiPAP is on.  AF (paroxysmal atrial fibrillation) (HCC) Continue metoprolol, Cardizem and amiodarone for rate control.  Eliquis for anticoagulation  Obesity, Class III, BMI 40-49.9 (morbid obesity) (HCC) BMI 46.15  Essential hypertension, benign Continue metoprolol and diltiazem and Lasix  RLS (restless legs syndrome) Continue pramipexole  Anemia Hemoglobin dropped down to  8.3 today.  Continue to monitor closely being on blood thinners.  OSA (obstructive sleep apnea) Usually uses CPAP at night but will convert to BiPAP with CO2 retention.  Candidate for noninvasive ventilation with elevated PCO2 on ABG.        Subjective: Patient was eating breakfast.  He was being fed by his wife.  Answers questions appropriately.  More concerned with eating today.  I am happier that he was more alert today.  He was able to wear his BiPAP last night.  Physical Exam: Vitals:   10/07/21 0400 10/07/21 0752 10/07/21 0856 10/07/21 1100  BP: 101/75 93/77 111/77 101/71  Pulse: (!) 109 (!) 108  (!) 109  Resp:  18  18  Temp: (!) 97.4 F (36.3 C)     TempSrc: Oral     SpO2: 98% 100%  92%  Weight:      Height:       Physical Exam HENT:     Head: Normocephalic.  Eyes:     General: Lids are normal.     Conjunctiva/sclera: Conjunctivae normal.  Cardiovascular:     Rate and Rhythm: Tachycardia present. Rhythm irregularly irregular.     Heart sounds: Normal heart sounds, S1 normal and S2 normal.  Pulmonary:     Breath sounds: No decreased breath sounds, wheezing, rhonchi or rales.  Abdominal:     Palpations: Abdomen is soft.     Tenderness: There is no abdominal tenderness.  Musculoskeletal:     Right lower leg: Edema present.  Skin:    General: Skin is warm.  Findings: No rash.     Comments: Chronic lower extremity skin discoloration right lower leg. Slight redness over nose where BiPAP was placed.  Neurological:     Mental Status: He is alert.     Comments: Patient more alert today than yesterday.     Data Reviewed: Hemoglobin 8.3, creatinine 0.79 last for sugars 162, 203, 144 on 219  Family Communication: Spoke with wife at the bedside  Disposition: Status is: Inpatient Remains inpatient appropriate because: We do not have a disposition plan at this point in time.  Will end up needing rehab but no beds available at this point. Planned Discharge  Destination: Rehab   Author: Loletha Grayer, MD 10/07/2021 1:30 PM  For on call review [email or web address].

## 2021-10-07 NOTE — Progress Notes (Addendum)
Patient bladder scan showed >712. Patient has not voided after IV Lasix given at 2147. 500 cc emptied from male perwick prior to HS lasix was given. Secure chat sent to B. Randol Kern NP. In and out cath 800cc output.

## 2021-10-07 NOTE — Progress Notes (Signed)
SUBJECTIVE: Patient is more comfortable   Vitals:   10/07/21 0309 10/07/21 0400 10/07/21 0752 10/07/21 0856  BP:  101/75 93/77 111/77  Pulse:  (!) 109 (!) 108   Resp:   18   Temp:  (!) 97.4 F (36.3 C)    TempSrc:  Oral    SpO2:  98% 100%   Weight: (!) 172 kg     Height:        Intake/Output Summary (Last 24 hours) at 10/07/2021 1108 Last data filed at 10/07/2021 0543 Gross per 24 hour  Intake 299.74 ml  Output 1300 ml  Net -1000.26 ml    LABS: Basic Metabolic Panel: Recent Labs    10/06/21 0605 10/07/21 0634  NA 143 141  K 4.3 4.3  CL 104 101  CO2 35* 36*  GLUCOSE 133* 151*  BUN 24* 23  CREATININE 0.79 0.79  CALCIUM 8.1* 8.1*   Liver Function Tests: No results for input(s): "AST", "ALT", "ALKPHOS", "BILITOT", "PROT", "ALBUMIN" in the last 72 hours. No results for input(s): "LIPASE", "AMYLASE" in the last 72 hours. CBC: Recent Labs    10/06/21 0605 10/07/21 0634  WBC 12.5* 6.6  HGB 9.0* 8.3*  HCT 30.7* 28.5*  MCV 92.2 94.4  PLT 247 252   Cardiac Enzymes: No results for input(s): "CKTOTAL", "CKMB", "CKMBINDEX", "TROPONINI" in the last 72 hours. BNP: Invalid input(s): "POCBNP" D-Dimer: No results for input(s): "DDIMER" in the last 72 hours. Hemoglobin A1C: No results for input(s): "HGBA1C" in the last 72 hours. Fasting Lipid Panel: No results for input(s): "CHOL", "HDL", "LDLCALC", "TRIG", "CHOLHDL", "LDLDIRECT" in the last 72 hours. Thyroid Function Tests: No results for input(s): "TSH", "T4TOTAL", "T3FREE", "THYROIDAB" in the last 72 hours.  Invalid input(s): "FREET3" Anemia Panel: No results for input(s): "VITAMINB12", "FOLATE", "FERRITIN", "TIBC", "IRON", "RETICCTPCT" in the last 72 hours.   PHYSICAL EXAM General: Well developed, well nourished, in no acute distress HEENT:  Normocephalic and atramatic Neck:  No JVD.  Lungs: Clear bilaterally to auscultation and percussion. Heart: HRRR . Normal S1 and S2 without gallops or murmurs.  Abdomen:  Bowel sounds are positive, abdomen soft and non-tender  Msk:  Back normal, normal gait. Normal strength and tone for age. Extremities: No clubbing, cyanosis or edema.   Neuro: Alert and oriented X 3. Psych:  Good affect, responds appropriately  TELEMETRY: Atrial fibrillation with ventricular rate 110  ASSESSMENT AND PLAN: Status post amputation lower knee due to osteomyelitis and staph sepsis.  Patient has atrial fibrillation with ventricular rate reasonable between 100 and 110.  Appears to be comfortable.  Continue current current medications.  Principal Problem:   Sepsis due to methicillin resistant Staphylococcus aureus (MRSA) ([place]) Active Problems:   Essential hypertension, benign   RLS (restless legs syndrome)   Osteomyelitis (HCC)   Obesity, Class III, BMI 40-49.9 (morbid obesity) (HCC)   Acute on chronic diastolic CHF (congestive heart failure) (HCC)   AF (paroxysmal atrial fibrillation) ([place])   Uncontrolled type 2 diabetes mellitus with hyperglycemia, with long-term current use of insulin (HCC)   Malnutrition of moderate degree   Generalized weakness   Acute metabolic encephalopathy   Acute respiratory failure with hypoxia and hypercapnia (HCC)   OSA (obstructive sleep apnea)   Acute delirium   Acute on chronic respiratory failure with hypoxia and hypercapnia (HCC)    Karolyne Timmons A, MD, [facility] 10/07/2021 11:08 AM

## 2021-10-07 NOTE — Assessment & Plan Note (Deleted)
Last hemoglobin 10.3

## 2021-10-07 NOTE — Plan of Care (Signed)
  Problem: Activity: Goal: Capacity to carry out activities will improve Outcome: Progressing   Problem: Cardiac: Goal: Ability to achieve and maintain adequate cardiopulmonary perfusion will improve Outcome: Progressing   

## 2021-10-08 DIAGNOSIS — I5033 Acute on chronic diastolic (congestive) heart failure: Secondary | ICD-10-CM | POA: Diagnosis not present

## 2021-10-08 DIAGNOSIS — A4102 Sepsis due to Methicillin resistant Staphylococcus aureus: Secondary | ICD-10-CM | POA: Diagnosis not present

## 2021-10-08 DIAGNOSIS — R338 Other retention of urine: Secondary | ICD-10-CM | POA: Clinically undetermined

## 2021-10-08 DIAGNOSIS — R410 Disorientation, unspecified: Secondary | ICD-10-CM | POA: Diagnosis not present

## 2021-10-08 LAB — TYPE AND SCREEN
ABO/RH(D): O NEG
Antibody Screen: NEGATIVE

## 2021-10-08 LAB — CBC
HCT: 28.4 % — ABNORMAL LOW (ref 39.0–52.0)
Hemoglobin: 8.1 g/dL — ABNORMAL LOW (ref 13.0–17.0)
MCH: 27.3 pg (ref 26.0–34.0)
MCHC: 28.5 g/dL — ABNORMAL LOW (ref 30.0–36.0)
MCV: 95.6 fL (ref 80.0–100.0)
Platelets: 270 10*3/uL (ref 150–400)
RBC: 2.97 MIL/uL — ABNORMAL LOW (ref 4.22–5.81)
RDW: 16.7 % — ABNORMAL HIGH (ref 11.5–15.5)
WBC: 6.5 10*3/uL (ref 4.0–10.5)
nRBC: 0 % (ref 0.0–0.2)

## 2021-10-08 LAB — BASIC METABOLIC PANEL
Anion gap: 4 — ABNORMAL LOW (ref 5–15)
BUN: 28 mg/dL — ABNORMAL HIGH (ref 8–23)
CO2: 36 mmol/L — ABNORMAL HIGH (ref 22–32)
Calcium: 8.1 mg/dL — ABNORMAL LOW (ref 8.9–10.3)
Chloride: 99 mmol/L (ref 98–111)
Creatinine, Ser: 0.81 mg/dL (ref 0.61–1.24)
GFR, Estimated: >60 mL/min (ref >60)
Glucose, Bld: 160 mg/dL — ABNORMAL HIGH (ref 70–99)
Potassium: 4.6 mmol/L (ref 3.5–5.1)
Sodium: 139 mmol/L (ref 135–145)

## 2021-10-08 LAB — GLUCOSE, CAPILLARY
Glucose-Capillary: 163 mg/dL — ABNORMAL HIGH (ref 70–99)
Glucose-Capillary: 295 mg/dL — ABNORMAL HIGH (ref 70–99)
Glucose-Capillary: 243 mg/dL — ABNORMAL HIGH (ref 70–99)
Glucose-Capillary: 219 mg/dL — ABNORMAL HIGH (ref 70–99)

## 2021-10-08 MED ORDER — INSULIN GLARGINE-YFGN 100 UNIT/ML ~~LOC~~ SOLN
21.0000 [IU] | Freq: Two times a day (BID) | SUBCUTANEOUS | Status: DC
  Administered 2021-10-08 – 2021-10-20 (×25): 21 [IU] via SUBCUTANEOUS
  Filled 2021-10-08 (×26): qty 0.21

## 2021-10-08 MED ORDER — INSULIN ASPART 100 UNIT/ML IJ SOLN
6.0000 [IU] | Freq: Three times a day (TID) | INTRAMUSCULAR | Status: DC
Start: 2021-10-08 — End: 2021-10-21
  Administered 2021-10-08 – 2021-10-19 (×29): 6 [IU] via SUBCUTANEOUS
  Filled 2021-10-08 (×28): qty 1

## 2021-10-08 MED ORDER — TAMSULOSIN HCL 0.4 MG PO CAPS
0.4000 mg | ORAL_CAPSULE | Freq: Two times a day (BID) | ORAL | Status: DC
  Administered 2021-10-08 – 2021-10-27 (×37): 0.4 mg via ORAL
  Filled 2021-10-08 (×38): qty 1

## 2021-10-08 MED ORDER — METOPROLOL TARTRATE 50 MG PO TABS
150.0000 mg | ORAL_TABLET | Freq: Two times a day (BID) | ORAL | Status: DC
  Administered 2021-10-08 – 2021-10-13 (×10): 150 mg via ORAL
  Filled 2021-10-08 (×10): qty 3

## 2021-10-08 NOTE — Progress Notes (Signed)
Progress Note   Patient: [name]. [id number]:[id number] DOB: [birth year] DOA: 09/28/2021     10 DOS: the patient was seen and examined on 10/08/2021     Assessment and Plan: * Sepsis due to methicillin resistant Staphylococcus aureus (MRSA) ([place]) MRSA growing out of blood cultures on 09/28/2021.  Repeat blood cultures negative.  Currently on daptomycin.  Source of infection likely osteomyelitis.  Will need 4 weeks total of antibiotics.  Acute delirium Mental status better today than it has been.  Slept well with BiPAP.  Able to have a good conversation with me and recall details of medications that he has been taking.  Osteomyelitis ([place]) Status post left BKA on 10/03/2021. Patient on IV daptomycin.    Acute urinary retention In and out catheterizations over the last 24 hours.  Foley catheter placed this morning.  Increase Flomax dose to twice daily.  Added Proscar yesterday.  Acute on chronic diastolic CHF (congestive heart failure) ([place]) Patient's last known LVEF is 55 to 60% from a 2D echocardiogram which was done 01/23. Continue Lasix IV.  Patient also on metoprolol.  Uncontrolled type 2 diabetes mellitus with hyperglycemia, with long-term current use of insulin (HCC) Last hemoglobin A1c in the chart of 10.2.  Since patient eating breakfast I will increase Semglee insulin to 21 units twice a day.  Continue short acting insulin prior to meals plus sliding scale.  Acute on chronic respiratory failure with hypoxia and hypercapnia (HCC) The patient needs BiPAP at night in order to have a good mental status.  With the redness on the nose asked nursing staff to place something on his nose while the BiPAP is on.  AF (paroxysmal atrial fibrillation) (HCC) Continue amiodarone for rate control.  Eliquis for anticoagulation.  With urinary retention I decreased the dose of Cardizem CD down to 240 mg daily.  We will have to go up on metoprolol to 150 mg twice a day.  Obesity, Class III, BMI  40-49.9 (morbid obesity) (HCC) BMI 46.45  Essential hypertension, benign Continue metoprolol and diltiazem and Lasix  RLS (restless legs syndrome) Continue pramipexole  Anemia Hemoglobin dropped down to 8.1 today.  Continue to monitor closely being on blood thinners.  Patient may end up needing a blood transfusion on this hospital course.  OSA (obstructive sleep apnea) Usually uses CPAP at night but will convert to BiPAP with CO2 retention.  Candidate for noninvasive ventilation with elevated PCO2 on ABG.        Subjective: Patient with better mental status today.  Feels well.  Slept well.  Able to tell me details about taking Flomax for a long time.  Better conversation today than previous.  Mental status much improved.  Physical Exam: Vitals:   10/08/21 0335 10/08/21 0448 10/08/21 0721 10/08/21 1231  BP: 101/70  116/77 113/76  Pulse: (!) 108  (!) 109 (!) 124  Resp: '17  17 17  '$ Temp: 97.6 F (36.4 C)     TempSrc: Oral     SpO2: 96%  94% 92%  Weight:  (!) 173.1 kg    Height:       Physical Exam HENT:     Head: Normocephalic.  Eyes:     General: Lids are normal.     Conjunctiva/sclera: Conjunctivae normal.  Cardiovascular:     Rate and Rhythm: Tachycardia present. Rhythm irregularly irregular.     Heart sounds: Normal heart sounds, S1 normal and S2 normal.  Pulmonary:     Breath sounds: No decreased breath  sounds, wheezing, rhonchi or rales.  Abdominal:     Palpations: Abdomen is soft.     Tenderness: There is no abdominal tenderness.  Musculoskeletal:     Right lower leg: Edema present.  Skin:    General: Skin is warm.     Findings: No rash.     Comments: Chronic lower extremity skin discoloration right lower leg. Slight redness over nose where BiPAP was placed.  Neurological:     Mental Status: He is alert.     Comments: Patient able to have better mental status today and better conversation today     Data Reviewed: Last sugar 295, creatinine 0.81,  hemoglobin 8.1  Family Communication: Spoke with patient's wife on the phone  Disposition: Status is: Inpatient Remains inpatient appropriate because: We do not have a rehab bed for him at this point.  Medically he could go out to rehab once a bed obtained but would need a BiPAP at night at the rehab and be able to do IV daptomycin. Planned Discharge Destination: Rehab  Author: Loletha Grayer, MD 10/08/2021 1:29 PM  For on call review [email or web address].

## 2021-10-08 NOTE — Assessment & Plan Note (Addendum)
Foley catheter placed on 10/08/2021.  Foley DC'd on 6/16 and passed voiding trial.

## 2021-10-08 NOTE — Progress Notes (Signed)
SUBJECTIVE: Denies any chest pain or shortness of breath   Vitals:   10/07/21 2340 10/08/21 0335 10/08/21 0448 10/08/21 0721  BP: (!) 98/46 101/70  116/77  Pulse: (!) 109 (!) 108  (!) 109  Resp: '18 17  17  '$ Temp: 97.6 F (36.4 C) 97.6 F (36.4 C)    TempSrc: Oral Oral    SpO2: 91% 96%  94%  Weight:   (!) 173.1 kg   Height:        Intake/Output Summary (Last 24 hours) at 10/08/2021 0853 Last data filed at 10/08/2021 0710 Gross per 24 hour  Intake 1212 ml  Output 1125 ml  Net 87 ml    LABS: Basic Metabolic Panel: Recent Labs    10/07/21 0634 10/08/21 0500  NA 141 139  K 4.3 4.6  CL 101 99  CO2 36* 36*  GLUCOSE 151* 160*  BUN 23 28*  CREATININE 0.79 0.81  CALCIUM 8.1* 8.1*   Liver Function Tests: No results for input(s): "AST", "ALT", "ALKPHOS", "BILITOT", "PROT", "ALBUMIN" in the last 72 hours. No results for input(s): "LIPASE", "AMYLASE" in the last 72 hours. CBC: Recent Labs    10/07/21 0634 10/08/21 0500  WBC 6.6 6.5  HGB 8.3* 8.1*  HCT 28.5* 28.4*  MCV 94.4 95.6  PLT 252 270   Cardiac Enzymes: No results for input(s): "CKTOTAL", "CKMB", "CKMBINDEX", "TROPONINI" in the last 72 hours. BNP: Invalid input(s): "POCBNP" D-Dimer: No results for input(s): "DDIMER" in the last 72 hours. Hemoglobin A1C: No results for input(s): "HGBA1C" in the last 72 hours. Fasting Lipid Panel: No results for input(s): "CHOL", "HDL", "LDLCALC", "TRIG", "CHOLHDL", "LDLDIRECT" in the last 72 hours. Thyroid Function Tests: No results for input(s): "TSH", "T4TOTAL", "T3FREE", "THYROIDAB" in the last 72 hours.  Invalid input(s): "FREET3" Anemia Panel: No results for input(s): "VITAMINB12", "FOLATE", "FERRITIN", "TIBC", "IRON", "RETICCTPCT" in the last 72 hours.   PHYSICAL EXAM General: Well developed, well nourished, in no acute distress HEENT:  Normocephalic and atramatic Neck:  No JVD.  Lungs: Clear bilaterally to auscultation and percussion. Heart: HRRR . Normal S1 and  S2 without gallops or murmurs.  Abdomen: Bowel sounds are positive, abdomen soft and non-tender  Msk:  Back normal, normal gait. Normal strength and tone for age. Extremities: No clubbing, cyanosis or edema.   Neuro: Alert and oriented X 3. Psych:  Good affect, responds appropriately  TELEMETRY: Remains in A-fib with ventricular rate 105  ASSESSMENT AND PLAN: Atrial fibrillation with rapid ventricular response rate currently remains in A-fib about 105 bpm status post MRSA osteomyelitis and amputation.  Patient appears to be very alert today and comfortable eating breakfast.  Principal Problem:   Sepsis due to methicillin resistant Staphylococcus aureus (MRSA) ([place]) Active Problems:   Essential hypertension, benign   RLS (restless legs syndrome)   Osteomyelitis (HCC)   Obesity, Class III, BMI 40-49.9 (morbid obesity) (HCC)   Acute on chronic diastolic CHF (congestive heart failure) (HCC)   AF (paroxysmal atrial fibrillation) ([place])   Uncontrolled type 2 diabetes mellitus with hyperglycemia, with long-term current use of insulin (HCC)   Malnutrition of moderate degree   Generalized weakness   Acute metabolic encephalopathy   Acute respiratory failure with hypoxia and hypercapnia (HCC)   OSA (obstructive sleep apnea)   Acute delirium   Acute on chronic respiratory failure with hypoxia and hypercapnia (HCC)   Anemia    Brylin Stanislawski A, MD, [facility] 10/08/2021 8:53 AM

## 2021-10-09 DIAGNOSIS — R7881 Bacteremia: Secondary | ICD-10-CM | POA: Diagnosis not present

## 2021-10-09 DIAGNOSIS — R338 Other retention of urine: Secondary | ICD-10-CM | POA: Diagnosis not present

## 2021-10-09 DIAGNOSIS — A4102 Sepsis due to Methicillin resistant Staphylococcus aureus: Secondary | ICD-10-CM | POA: Diagnosis not present

## 2021-10-09 DIAGNOSIS — M86672 Other chronic osteomyelitis, left ankle and foot: Secondary | ICD-10-CM | POA: Diagnosis not present

## 2021-10-09 DIAGNOSIS — I5033 Acute on chronic diastolic (congestive) heart failure: Secondary | ICD-10-CM | POA: Diagnosis not present

## 2021-10-09 DIAGNOSIS — R410 Disorientation, unspecified: Secondary | ICD-10-CM | POA: Diagnosis not present

## 2021-10-09 DIAGNOSIS — B9562 Methicillin resistant Staphylococcus aureus infection as the cause of diseases classified elsewhere: Secondary | ICD-10-CM | POA: Diagnosis not present

## 2021-10-09 LAB — CBC
HCT: 30.6 % — ABNORMAL LOW (ref 39.0–52.0)
Hemoglobin: 8.7 g/dL — ABNORMAL LOW (ref 13.0–17.0)
MCH: 26.9 pg (ref 26.0–34.0)
MCHC: 28.4 g/dL — ABNORMAL LOW (ref 30.0–36.0)
MCV: 94.7 fL (ref 80.0–100.0)
Platelets: 284 10*3/uL (ref 150–400)
RBC: 3.23 MIL/uL — ABNORMAL LOW (ref 4.22–5.81)
RDW: 16.5 % — ABNORMAL HIGH (ref 11.5–15.5)
WBC: 6.6 10*3/uL (ref 4.0–10.5)
nRBC: 0 % (ref 0.0–0.2)

## 2021-10-09 LAB — BASIC METABOLIC PANEL
Anion gap: 5 (ref 5–15)
BUN: 26 mg/dL — ABNORMAL HIGH (ref 8–23)
CO2: 36 mmol/L — ABNORMAL HIGH (ref 22–32)
Calcium: 8.3 mg/dL — ABNORMAL LOW (ref 8.9–10.3)
Chloride: 100 mmol/L (ref 98–111)
Creatinine, Ser: 0.7 mg/dL (ref 0.61–1.24)
GFR, Estimated: >60 mL/min (ref >60)
Glucose, Bld: 172 mg/dL — ABNORMAL HIGH (ref 70–99)
Potassium: 4.7 mmol/L (ref 3.5–5.1)
Sodium: 141 mmol/L (ref 135–145)

## 2021-10-09 LAB — GLUCOSE, CAPILLARY
Glucose-Capillary: 154 mg/dL — ABNORMAL HIGH (ref 70–99)
Glucose-Capillary: 263 mg/dL — ABNORMAL HIGH (ref 70–99)
Glucose-Capillary: 243 mg/dL — ABNORMAL HIGH (ref 70–99)
Glucose-Capillary: 178 mg/dL — ABNORMAL HIGH (ref 70–99)

## 2021-10-09 LAB — CK: Total CK: 30 U/L — ABNORMAL LOW (ref 49–397)

## 2021-10-09 MED ORDER — QUETIAPINE FUMARATE 25 MG PO TABS
25.0000 mg | ORAL_TABLET | Freq: Every day | ORAL | Status: DC
  Administered 2021-10-09 – 2021-10-18 (×10): 25 mg via ORAL
  Filled 2021-10-09 (×10): qty 1

## 2021-10-09 MED ORDER — MORPHINE SULFATE (PF) 2 MG/ML IV SOLN: 2.0000 mg | INTRAVENOUS | Status: DC | PRN

## 2021-10-09 MED ORDER — OXYCODONE-ACETAMINOPHEN 5-325 MG PO TABS
1.0000 | ORAL_TABLET | ORAL | Status: DC | PRN
  Administered 2021-10-09: 1 via ORAL
  Filled 2021-10-09: qty 1

## 2021-10-09 MED ORDER — CHLORHEXIDINE GLUCONATE CLOTH 2 % EX PADS
6.0000 | MEDICATED_PAD | Freq: Every day | CUTANEOUS | Status: DC
  Administered 2021-10-09 – 2021-11-13 (×34): 6 via TOPICAL

## 2021-10-09 NOTE — Progress Notes (Signed)
Physical Therapy Treatment Patient Details Name: [name]. MRN: [id number] DOB: Aug 23, 1955 Today's Date: 10/09/2021   History of Present Illness [name] is a 49yoM who [facility] c progression of Left chornic heel ulcer. MRI showed significant progressive osteomyelitis in the left calcaneus. PMH HTN, IDDM2, morbid obesity, depression, CAD s/p CABG 2006 and PCI to RCA in 12/2020, chronic B venous stasis on [place] boots, chronic L foot ulcer, COPD, OSA on CPAP nightly, ai-fib, PAD, s/p multiple toe amputations.  S/p L BKA on 10/03/21.    PT Comments    Pt in room finishing lunch, author helps by opening a rigorous milk carton, then returns 40 minutes later for session to allow time to finish meal. Pt awake, agreeable to session. Remains profoundly weak all over. Pt has no pain limitations in session. Pt needs frequent cues to attend to task of exercise. NSG made aware of bowel incontinence in bed, pt left in a position that would prevent gravity from bringing bowel to surgical wound.    Recommendations for follow up therapy are one component of a multi-disciplinary discharge planning process, led by the attending physician.  Recommendations may be updated based on patient status, additional functional criteria and insurance authorization.  Follow Up Recommendations  Skilled nursing-short term rehab (<3 hours/day)     Assistance Recommended at Discharge Frequent or constant Supervision/Assistance  Patient can return home with the following Two people to help with bathing/dressing/bathroom;Two people to help with walking and/or transfers;Assistance with cooking/housework;Assist for transportation;Help with stairs or ramp for entrance;Direct supervision/assist for medications management;Direct supervision/assist for financial management   Equipment Recommendations  None recommended by PT    Recommendations for Other Services       Precautions / Restrictions  Precautions Precautions: Fall Restrictions LLE Weight Bearing: Non weight bearing     Mobility  Bed Mobility Overal bed mobility:  (reclined to short sitting +2-3)                  Transfers                        Ambulation/Gait                   Stairs             Wheelchair Mobility    Modified Rankin (Stroke Patients Only)       Balance                                            Cognition Arousal/Alertness: Awake/alert Behavior During Therapy: WFL for tasks assessed/performed Overall Cognitive Status: Impaired/Different from baseline (can hold a conversation but is disoriented to situation at times, unable to provide any details on mobility PTA or homesetup)                                          Exercises General Exercises - Lower Extremity Short Arc Quad: AAROM, Right, 15 reps, Limitations Short Arc Quad Limitations: needs cues to attend to task Heel Slides: AAROM, Right, 15 reps, Supine Heel Raises: Strengthening, Right, 10 reps, Supine, Limitations Heel Raises Limitations: cannot tolerate much resistance Amputee Exercises Hip Extension: Left, 10 reps, Seated, Limitations Hip Extension Limitations: against  towel resistance around distal thigh Hip ABduction/ADduction: AAROM, Right, 10 reps, Seated, Limitations Hip Abduction/Adduction Limitations: seems to struggle with attending to this activity Hip Flexion/Marching: AAROM, Left, 10 reps, Seated, Limitations Hip Flexion/Marching Limitations: reqires maxA for limb due to large habitus and severe weakness    General Comments        Pertinent Vitals/Pain Pain Assessment Pain Assessment: No/denies pain    Home Living                          Prior Function            PT Goals (current goals can now be found in the care plan section) Acute Rehab PT Goals Patient Stated Goal: pt did not participate in goal assessment. PT  Goal Formulation: Patient unable to participate in goal setting    Frequency    Min 2X/week      PT Plan Current plan remains appropriate    Co-evaluation              AM-PAC PT "6 Clicks" Mobility   Outcome Measure  Help needed turning from your back to your side while in a flat bed without using bedrails?: Total Help needed moving from lying on your back to sitting on the side of a flat bed without using bedrails?: Total Help needed moving to and from a bed to a chair (including a wheelchair)?: Total Help needed standing up from a chair using your arms (e.g., wheelchair or bedside chair)?: Total Help needed to walk in hospital room?: Total Help needed climbing 3-5 steps with a railing? : Total 6 Click Score: 6    End of Session Equipment Utilized During Treatment: Oxygen Activity Tolerance:  (has diarrhead the bed, needs clean up to keep wound clean) Patient left: in bed;with bed alarm set Nurse Communication: Mobility status PT Visit Diagnosis: Difficulty in walking, not elsewhere classified (R26.2);Other abnormalities of gait and mobility (R26.89)     Time: 1429-1450 PT Time Calculation (min) (ACUTE ONLY): 21 min  Charges:  $Therapeutic Exercise: 8-22 mins                    3:24 PM, 10/09/21 Etta Grandchild, PT, DPT Physical Therapist - [facility]  [phone] ([place])     Ashyr Hedgepath C 10/09/2021, 3:18 PM

## 2021-10-09 NOTE — Progress Notes (Signed)
PT on V60/BIPAP for HS, tolerating well. SpO2 97% on 40%.

## 2021-10-09 NOTE — Progress Notes (Signed)
Occupational Therapy Treatment Patient Details Name: [name]. MRN: [id number] DOB: October 30, 1955 Today's Date: 10/09/2021   History of present illness [name] is a 15yoM who comes to [facility] on 09/28/21 c progression of Left chornic heel ulcer. MRI showed significant progressive osteomyelitis in the left calcaneus. PMH HTN, IDDM2, morbid obesity, depression, CAD s/p CABG 2006 and PCI to RCA in 12/2020, chronic B venous stasis on Un[place] boots, chronic L foot ulcer, COPD, OSA on CPAP nightly, ai-fib, PAD, s/p multiple toe amputations.  S/p L BKA on 10/03/21.   OT comments  Pt seen for OT treatment on this date. Upon arrival to room pt awake and alert lying in bed. Pt agreeable to tx focused on UE strengthening to improve occupational performance in ADLs. Pt completed UE taunt towel exercises 1 set x10 - shoulder elevation, chest press, forward row, and bicep curls. Pt required SUPERVISION with cueing to sustain attention to performing therapeutic exercises lying in bed - limited by fatigue. Pt making good progress toward goals. Pt continues to benefit from skilled OT services to maximize return to PLOF and minimize risk of future falls, injury. Will continue to follow POC. Discharge recommendation remains appropriate.     Recommendations for follow up therapy are one component of a multi-disciplinary discharge planning process, led by the attending physician.  Recommendations may be updated based on patient status, additional functional criteria and insurance authorization.    Follow Up Recommendations  Skilled nursing-short term rehab (<3 hours/day)    Assistance Recommended at Discharge Frequent or constant Supervision/Assistance  Patient can return home with the following  A lot of help with bathing/dressing/bathroom;Assistance with cooking/housework;Help with stairs or ramp for entrance;Assist for transportation;Two people to help with walking and/or transfers   Equipment Recommendations   Other (comment) (defer to next venue of care)    Recommendations for Other Services      Precautions / Restrictions Precautions Precautions: Fall Restrictions Weight Bearing Restrictions: No LLE Weight Bearing: Non weight bearing       Mobility Bed Mobility               General bed mobility comments: not tested, remained lying in bed during session    Transfers                             ADL either performed or assessed with clinical judgement   ADL Overall ADL's : Needs assistance/impaired                                       General ADL Comments: Pt required SUPERVISION with cueing to sustain attetntion to performing therapeutic exercises lying in bed.      Cognition Arousal/Alertness: Awake/alert Behavior During Therapy: WFL for tasks assessed/performed Overall Cognitive Status: Impaired/Different from baseline (Pt unable to identify time (day of the week/date) and cueing to attend to task) Area of Impairment: Orientation, Attention                 Orientation Level: Time Current Attention Level: Focused                    Exercises Exercises: Other exercises Other Exercises Other Exercises: Pt completed taunt towel UE exercises - chest press, shoulder elevation, forward rows, and bicep curls.            Pertinent  Vitals/ Pain       Pain Assessment Pain Assessment: Faces Faces Pain Scale: No hurt   Frequency  Min 2X/week        Progress Toward Goals  OT Goals(current goals can now be found in the care plan section)  Progress towards OT goals: Progressing toward goals  Acute Rehab OT Goals Patient Stated Goal: to get better OT Goal Formulation: With patient Time For Goal Achievement: 10/13/21 Potential to Achieve Goals: Good ADL Goals Pt Will Perform Lower Body Dressing: with min assist;sitting/lateral leans Pt Will Transfer to Toilet: with min assist Pt Will Perform Toileting - Clothing  Manipulation and hygiene: with min assist  Plan Discharge plan remains appropriate;Frequency remains appropriate       AM-PAC OT "6 Clicks" Daily Activity     Outcome Measure   Help from another person eating meals?: None Help from another person taking care of personal grooming?: A Lot Help from another person toileting, which includes using toliet, bedpan, or urinal?: Total Help from another person bathing (including washing, rinsing, drying)?: Total Help from another person to put on and taking off regular upper body clothing?: A Lot Help from another person to put on and taking off regular lower body clothing?: Total 6 Click Score: 11    End of Session Equipment Utilized During Treatment: Oxygen  OT Visit Diagnosis: Unsteadiness on feet (R26.81);Repeated falls (R29.6);Muscle weakness (generalized) (M62.81)   Activity Tolerance Patient tolerated treatment well;Patient limited by fatigue   Patient Left in bed;with call bell/phone within reach;with bed alarm set   Nurse Communication          Time: 2841-3244 OT Time Calculation (min): 22 min  Charges: OT General Charges $OT Visit: 1 Visit OT Treatments $Therapeutic Exercise: 8-22 mins  [employer] OTDS  [address] Tavian Callander 10/09/2021, 4:41 PM

## 2021-10-09 NOTE — Progress Notes (Signed)
Progress Note   Patient: [name]. UDJ:[id number] DOB: 18-Apr-1956 DOA: 09/28/2021     11 DOS: the patient was seen and examined on 10/09/2021     Assessment and Plan: * Sepsis due to methicillin resistant Staphylococcus aureus (MRSA) ([place]) MRSA growing out of blood cultures on 09/28/2021.  Repeat blood cultures negative.  Currently on daptomycin.  Source of infection likely osteomyelitis.  Will need 4 weeks total of antibiotics total.  Acute delirium Mental status this morning was not as good as yesterday.  Patient answered questions appropriately but definitely not as talkative as yesterday.  As per nursing staff he has not been taking pain medications.  Patient's wife hesitant about stopping Mirapex and gabapentin at this point.  Stressed to nursing staff of the importance of BiPAP at night.  We will try to decrease dose of Seroquel down to 25 mg at night.  Osteomyelitis ([place]) Status post left BKA on 10/03/2021. Patient on IV daptomycin.    Acute urinary retention Continue Foley catheter for a week and then can do a voiding trial.  Increase Flomax to twice daily dosing and continue Proscar.  Acute on chronic diastolic CHF (congestive heart failure) ([place]) Patient's last known LVEF is 55 to 60% from a 2D echocardiogram which was done 01/23. Continue Lasix IV.  Patient also on metoprolol.  Uncontrolled type 2 diabetes mellitus with hyperglycemia, with long-term current use of insulin (HCC) Last hemoglobin A1c in the chart of 10.2.  Continue Semglee insulin to 21 units twice a day.  Continue short acting insulin prior to meals plus sliding scale.  Acute on chronic respiratory failure with hypoxia and hypercapnia (HCC) The patient needs BiPAP at night in order to have a good mental status.  AF (paroxysmal atrial fibrillation) (HCC) Continue amiodarone for rate control.  Eliquis for anticoagulation.  With urinary retention I decreased the dose of Cardizem CD down to 240 mg daily.   I increase the metoprolol 150 mg twice daily.  Obesity, Class III, BMI 40-49.9 (morbid obesity) (HCC) BMI 46.48  Essential hypertension, benign Continue metoprolol and diltiazem and Lasix  RLS (restless legs syndrome) Continue pramipexole and gabapentin  Anemia Hemoglobin up to 8.7 today.  Continue to monitor closely being on blood thinners.    OSA (obstructive sleep apnea) Usually uses CPAP at night but will convert to BiPAP with CO2 retention.  Candidate for noninvasive ventilation with elevated PCO2 on ABG.        Subjective: Patient seen this morning.  We took off his BiPAP.  Answered a few questions appropriately.  Definitely not as talkative today as yesterday.  Admitted with sepsis secondary to MRSA  Physical Exam: Vitals:   10/09/21 0611 10/09/21 0700 10/09/21 1015 10/09/21 1100  BP:  119/85  105/75  Pulse:  (!) 109  (!) 108  Resp:  (!) 21  16  Temp:    98 F (36.7 C)  TempSrc:    Oral  SpO2:  94% 92% 94%  Weight: (!) 173.2 kg     Height:       Physical Exam HENT:     Head: Normocephalic.  Eyes:     General: Lids are normal.     Conjunctiva/sclera: Conjunctivae normal.  Cardiovascular:     Rate and Rhythm: Tachycardia present. Rhythm irregularly irregular.     Heart sounds: Normal heart sounds, S1 normal and S2 normal.  Pulmonary:     Breath sounds: No decreased breath sounds, wheezing, rhonchi or rales.  Abdominal:  Palpations: Abdomen is soft.     Tenderness: There is no abdominal tenderness.  Musculoskeletal:     Right lower leg: Edema present.  Skin:    General: Skin is warm.     Findings: No rash.     Comments: Chronic lower extremity skin discoloration right lower leg. Slight redness over nose where BiPAP was placed.  Neurological:     Mental Status: He is alert.     Comments: Patient answers a few questions but does not elaborate much today.     Data Reviewed: Hemoglobin up at 8.7, CPK 30, creatinine 0.7  Family Communication: Spoke  with patient's wife on the phone  Disposition: Status is: Inpatient Remains inpatient appropriate because: We do not have a disposition plan at this point.  Will need rehab but no rehab beds available at this point Planned Discharge Destination: Rehab   Author: Loletha Grayer, MD 10/09/2021 4:15 PM  For on call review [email or web address].

## 2021-10-09 NOTE — Progress Notes (Signed)
ID Pt doing better More awake and alert   BP 119/85 (BP Location: Right Arm)   Pulse (!) 109   Temp 97.9 F (36.6 C) (Axillary)   Resp (!) 21   Ht '6\' 4"'$  (1.93 m)   Wt (!) 173.2 kg   SpO2 92%   BMI 46.48 kg/m   Responding to commands appropriately Chest b/l air entry Tachycardia Left BKA- site picture reviewed Clean    Labs    Latest Ref Rng & Units 10/09/2021    4:53 AM 10/08/2021    5:00 AM 10/07/2021    6:34 AM  CBC  WBC 4.0 - 10.5 K/uL 6.6  6.5  6.6   Hemoglobin 13.0 - 17.0 g/dL 8.7  8.1  8.3   Hematocrit 39.0 - 52.0 % 30.6  28.4  28.5   Platelets 150 - 400 K/uL 284  270  252        Latest Ref Rng & Units 10/09/2021    4:53 AM 10/08/2021    5:00 AM 10/07/2021    6:34 AM  CMP  Glucose 70 - 99 mg/dL 172  160  151   BUN 8 - 23 mg/dL '26  28  23   '$ Creatinine 0.61 - 1.24 mg/dL 0.70  0.81  0.79   Sodium 135 - 145 mmol/L 141  139  141   Potassium 3.5 - 5.1 mmol/L 4.7  4.6  4.3   Chloride 98 - 111 mmol/L 100  99  101   CO2 22 - 32 mmol/L 36  36  36   Calcium 8.9 - 10.3 mg/dL 8.3  8.1  8.1        Impression/recommendation MRSA bacteremia secondary to left heel ulcer with underlying osteomyelitis. Low bioburden 1 of 4 Repeat blood culture neg MRSA in wound culture as well Patient is currently Daptomycin Underwent BKA Will need IV antibiotic for minimum 4 weeks 11/02/21 2 d echo no vegetation- Mitral valve is thickened,no vegetation   Encephalopathy.  Multifactorial--predominantly due to CO2 narcosis/ . On intermittent BIPAP  Severe venous edema of both legs  AKI resolved  Paroxysmal A-fib with atrial flutter.  On amiodarone metoprolol and Eliquis  Coronary artery disease status post CABG  Diastolic CHF started on furosemide  Discussed the management with the patient and care team

## 2021-10-09 NOTE — Progress Notes (Signed)
[facility] Cardiology  Patient ID: [name]. MRN: [id number] DOB/AGE: 09-24-1955 66 y.o.   Admit date: 09/28/2021 Referring Physician Dr. Francine Graven Primary Physician Dr. Felipa Furnace Primary Cardiologist Dr. Donnelly Angelica Reason for Consultation acute on chronic HFpEF    HPI: Jobie "Alec" Taelon Wood. is a [phone] with a PMH significant for CAD s/p CABG 2006 and PCI of RCA 01/2020, HFpEF (LVEF 50-55% 04/2021), A. fib / flutter on apixaban, amiodarone and digoxin, HTN, BPH, morbid obesity, poorly controlled type 2 diabetes,  PAD s/p left third toe amputation, chronic venous stasis, OSA on CPAP who presented to [facility] ED on 09/28/21 with increasing pain and swelling in his left leg, and imaging concerning for worsening osteomyelitis of his left foot. He has has onoging infections needing wound debridement in his left lower leg over the past several months. Cardiology is consulted for assistance with his heart failure.   Interval History:  -some issues with urinary retention over the weekend, hospitalist increased flomax to BID and added proscar and ordered foley with good return of urine -alert and oriented, but thinks he went to a party this past weekend -admits to some soreness in his left leg, denies shortness of breath, chest pain   Vitals:   10/09/21 0300 10/09/21 0335 10/09/21 0611 10/09/21 0700  BP:  101/79  119/85  Pulse: (!) 108 (!) 109  (!) 109  Resp:  20  (!) 21  Temp:  97.9 F (36.6 C)    TempSrc:  Axillary    SpO2: 95% 96%  94%  Weight:  (!) 169.6 kg (!) 173.2 kg   Height:         Intake/Output Summary (Last 24 hours) at 10/09/2021 0856 Last data filed at 10/09/2021 0737 Gross per 24 hour  Intake 600 ml  Output 3300 ml  Net -2700 ml    PHYSICAL EXAM General: Ill-appearing Caucasian male,  in no acute distress. Sitting upright in PCU bed with granddaughter at bedside, eating breakfast HEENT:  Normocephalic and atraumatic. Neck:   No JVD.  Lungs: Normal respiratory effort  on 2L by [place]. Clear bilaterally to auscultation anteriorly. No wheezes, crackles, rhonchi.  Heart: Tachy but regular. Normal S1 and S2 without gallops or murmurs. Abdomen: Obese appearing.  Msk: Normal strength and tone for age. Extremities:  Significant bilateral lower extremity edema, RLE with venous stasis changes and woody appearance to skin, improving slightly with Wood tightness Left leg with dry padded dressing Neuro: Alert and oriented x3 Psych: answers questions appropriately    LABS: Basic Metabolic Panel: Recent Labs    10/08/21 0500 10/09/21 0453  NA 139 141  K 4.6 4.7  CL 99 100  CO2 36* 36*  GLUCOSE 160* 172*  BUN 28* 26*  CREATININE 0.81 0.70  CALCIUM 8.1* 8.3*    Liver Function Tests: No results for input(s): "AST", "ALT", "ALKPHOS", "BILITOT", "PROT", "ALBUMIN" in the last 72 hours.  No results for input(s): "LIPASE", "AMYLASE" in the last 72 hours. CBC: Recent Labs    10/08/21 0500 10/09/21 0453  WBC 6.5 6.6  HGB 8.1* 8.7*  HCT 28.4* 30.6*  MCV 95.6 94.7  PLT 270 284    Cardiac Enzymes: Recent Labs    10/09/21 0453  CKTOTAL 30*    BNP: Invalid input(s): "POCBNP" D-Dimer: No results for input(s): "DDIMER" in the last 72 hours. Hemoglobin A1C: No results for input(s): "HGBA1C" in the last 72 hours. Fasting Lipid Panel: No results for input(s): "CHOL", "HDL", "LDLCALC", "TRIG", "CHOLHDL", "LDLDIRECT"  in the last 72 hours. Thyroid Function Tests: No results for input(s): "TSH", "T4TOTAL", "T3FREE", "THYROIDAB" in the last 72 hours.  Invalid input(s): "FREET3" Anemia Panel: No results for input(s): "VITAMINB12", "FOLATE", "FERRITIN", "TIBC", "IRON", "RETICCTPCT" in the last 72 hours.  No results found.   Echo LVEF 50-55% 05/10/2021  TELEMETRY: Atrial fibrillation 111 bpm:  ASSESSMENT AND PLAN:  Principal Problem:   Sepsis due to methicillin resistant Staphylococcus aureus (MRSA) ([place]) Active Problems:   Essential hypertension,  benign   RLS (restless legs syndrome)   Osteomyelitis (HCC)   Obesity, Class III, BMI 40-49.9 (morbid obesity) (HCC)   Acute on chronic diastolic CHF (congestive heart failure) (HCC)   AF (paroxysmal atrial fibrillation) ([place])   Uncontrolled type 2 diabetes mellitus with hyperglycemia, with long-term current use of insulin (HCC)   Malnutrition of moderate degree   Generalized weakness   Acute metabolic encephalopathy   Acute respiratory failure with hypoxia and hypercapnia (HCC)   OSA (obstructive sleep apnea)   Acute delirium   Acute on chronic respiratory failure with hypoxia and hypercapnia (HCC)   Anemia   Acute urinary retention    1. Acute on chronic diastolic congestive heart failure, HFpEF, LVEF 50-55% 05/10/2021, good diuresis after IV furosemide but remains on supplemental O2 2.  Paroxysmal atrial fibrillation/atrial flutter, currently in atrial flutter heart rate 109 bpm, on amiodarone and metoprolol for rate control and Eliquis for stroke prevention, cardizem '360mg'$  CD 3.  Coronary artery disease, status post CABG 2006, PCI RCA 01/2020, currently without chest pain 4.  Left foot osteomyelitis on IV vancomycin, left BKA 6/6 with Dr. Delana Meyer 5.  Acute kidney injury, improving to Cr 1.03 and >60  6.  MRSA bacteremia, osteomyelitis of L foot likely source, on IV daptomycin, repeat echo ordered by ID, resulted with thickened aortic and mitral leaflets without obvious vegetation. Will need 4 weeks of Abx, per ID   Recommendations   1.  Agree with current therapy 2.  Continue IV lasix '40mg'$  BID, BMP daily  3.  Eliquis '5mg'$  BID restarted 6/9 for stroke risk reduction  4.  Continue amiodarone, metoprolol tartrate, and cardizem for rate and rhythm control 5.  S/p Left BKA 6/6   This patient's plan of care was discussed and created with Dr. Clayborn Bigness and he is in agreement.    Tristan Schroeder, PA-C 10/09/2021 8:56 AM

## 2021-10-10 DIAGNOSIS — R410 Disorientation, unspecified: Secondary | ICD-10-CM | POA: Diagnosis not present

## 2021-10-10 DIAGNOSIS — I5033 Acute on chronic diastolic (congestive) heart failure: Secondary | ICD-10-CM | POA: Diagnosis not present

## 2021-10-10 DIAGNOSIS — R338 Other retention of urine: Secondary | ICD-10-CM | POA: Diagnosis not present

## 2021-10-10 DIAGNOSIS — A4102 Sepsis due to Methicillin resistant Staphylococcus aureus: Secondary | ICD-10-CM | POA: Diagnosis not present

## 2021-10-10 LAB — BASIC METABOLIC PANEL
Anion gap: 4 — ABNORMAL LOW (ref 5–15)
BUN: 26 mg/dL — ABNORMAL HIGH (ref 8–23)
CO2: 38 mmol/L — ABNORMAL HIGH (ref 22–32)
Calcium: 8.6 mg/dL — ABNORMAL LOW (ref 8.9–10.3)
Chloride: 99 mmol/L (ref 98–111)
Creatinine, Ser: 0.7 mg/dL (ref 0.61–1.24)
GFR, Estimated: >60 mL/min (ref >60)
Glucose, Bld: 130 mg/dL — ABNORMAL HIGH (ref 70–99)
Potassium: 4.9 mmol/L (ref 3.5–5.1)
Sodium: 141 mmol/L (ref 135–145)

## 2021-10-10 LAB — GLUCOSE, CAPILLARY
Glucose-Capillary: 114 mg/dL — ABNORMAL HIGH (ref 70–99)
Glucose-Capillary: 216 mg/dL — ABNORMAL HIGH (ref 70–99)
Glucose-Capillary: 229 mg/dL — ABNORMAL HIGH (ref 70–99)
Glucose-Capillary: 140 mg/dL — ABNORMAL HIGH (ref 70–99)

## 2021-10-10 LAB — CBC
HCT: 31.5 % — ABNORMAL LOW (ref 39.0–52.0)
Hemoglobin: 8.9 g/dL — ABNORMAL LOW (ref 13.0–17.0)
MCH: 26.8 pg (ref 26.0–34.0)
MCHC: 28.3 g/dL — ABNORMAL LOW (ref 30.0–36.0)
MCV: 94.9 fL (ref 80.0–100.0)
Platelets: 315 10*3/uL (ref 150–400)
RBC: 3.32 MIL/uL — ABNORMAL LOW (ref 4.22–5.81)
RDW: 16.7 % — ABNORMAL HIGH (ref 11.5–15.5)
WBC: 7.1 10*3/uL (ref 4.0–10.5)
nRBC: 0 % (ref 0.0–0.2)

## 2021-10-10 NOTE — Progress Notes (Signed)
  Interdisciplinary Goals of Care Family Meeting   Date carried out: 10/10/2021  Location of the meeting: Bedside with the patient on the phone with the patient's wife  Member's involved: Spoke with patient at the bedside On the phone patient's wife Durable Power of Attorney or Loss adjuster, chartered: Patient's wife  Discussion: We discussed goals of care for [employer]. .  Goal is to get mental status as good as possible.  Treating for acute delirium during the hospital course.  Patient will go out to rehab before going home.  Code status: Full Code  Disposition: Rehab  Time spent for the meeting: 5 minutes  Loletha Grayer, MD  10/10/2021, 4:56 PM

## 2021-10-10 NOTE — Progress Notes (Signed)
[name]  Patient ID: [name] MRN: [id number] DOB/AGE: 1955-06-20 66 y.o.   Admit date: 09/28/2021 Referring Physician Dr. Francine Graven Primary Physician Dr. Felipa Furnace Primary Cardiologist Dr. Donnelly Angelica Reason for Consultation acute on chronic HFpEF    HPI: Legrande "[name]" Quadir Muns. is a [phone] with a PMH significant for CAD s/p CABG 2006 and PCI of RCA 01/2020, HFpEF (LVEF 50-55% 04/2021), A. fib / flutter on apixaban, amiodarone and digoxin, HTN, BPH, morbid obesity, poorly controlled type 2 diabetes,  PAD s/p left third toe amputation, chronic venous stasis, OSA on CPAP who presented to [facility] ED on 09/28/21 with increasing pain and swelling in his left leg, and imaging concerning for worsening osteomyelitis of his left foot. He has has onoging infections needing wound debridement in his left lower leg over the past several months. Cardiology is consulted for assistance with his heart failure.   Interval History:  -Brisk UOP overnight, cumulative net 10L since admission, renal function stable -no shortness of breath, some itching in the left BKA stump.  -pending rehab bed placement  -alert and oriented to self, place, and situation - makes some jokes re his leg today   Vitals:   10/09/21 2300 10/10/21 0514 10/10/21 0600 10/10/21 0909  BP:  93/68  128/85  Pulse:  (!) 106  (!) 108  Resp:  16  20  Temp: 98.1 F (36.7 C) 98.1 F (36.7 C)    TempSrc:      SpO2:  94%  100%  Weight:   (!) 171.4 kg   Height:         Intake/Output Summary (Last 24 hours) at 10/10/2021 5631 Last data filed at 10/10/2021 0515 Gross per 24 hour  Intake 240 ml  Output 3000 ml  Net -2760 ml    PHYSICAL EXAM General: Ill-appearing Caucasian male,  in no acute distress. Sitting upright in PCU bed watching TV. No family at bedside.  HEENT:  Normocephalic and atraumatic. Neck:   No JVD.  Lungs: Normal respiratory effort on 2L by [place]. Clear bilaterally to auscultation. No wheezes, crackles,  rhonchi.  Heart: Tachy but regular. Normal S1 and S2 without gallops or murmurs.  Abdomen: Obese appearing.  Msk: Normal strength and tone for age. Extremities:  Significant bilateral lower extremity edema, RLE with venous stasis changes and woody appearance to skin, improving slightly with less tightness Left leg with dry padded dressing Neuro: Alert and oriented x3 Psych: answers questions appropriately    LABS: Basic Metabolic Panel: Recent Labs    10/09/21 0453 10/10/21 0436  NA 141 141  K 4.7 4.9  CL 100 99  CO2 36* 38*  GLUCOSE 172* 130*  BUN 26* 26*  CREATININE 0.70 0.70  CALCIUM 8.3* 8.6*    Liver Function Tests: No results for input(s): "AST", "ALT", "ALKPHOS", "BILITOT", "PROT", "ALBUMIN" in the last 72 hours.  No results for input(s): "LIPASE", "AMYLASE" in the last 72 hours. CBC: Recent Labs    10/09/21 0453 10/10/21 0436  WBC 6.6 7.1  HGB 8.7* 8.9*  HCT 30.6* 31.5*  MCV 94.7 94.9  PLT 284 315    Cardiac Enzymes: Recent Labs    10/09/21 0453  CKTOTAL 30*    BNP: Invalid input(s): "POCBNP" D-Dimer: No results for input(s): "DDIMER" in the last 72 hours. Hemoglobin A1C: No results for input(s): "HGBA1C" in the last 72 hours. Fasting Lipid Panel: No results for input(s): "CHOL", "HDL", "LDLCALC", "TRIG", "CHOLHDL", "LDLDIRECT" in the last 72 hours. Thyroid Function  Tests: No results for input(s): "TSH", "T4TOTAL", "T3FREE", "THYROIDAB" in the last 72 hours.  Invalid input(s): "FREET3" Anemia Panel: No results for input(s): "VITAMINB12", "FOLATE", "FERRITIN", "TIBC", "IRON", "RETICCTPCT" in the last 72 hours.  No results found.   Echo LVEF 50-55% 05/10/2021  TELEMETRY: Atrial fibrillation 111 bpm:  ASSESSMENT AND PLAN:  Principal Problem:   Sepsis due to methicillin resistant Staphylococcus aureus (MRSA) ([place]) Active Problems:   Essential hypertension, benign   RLS (restless legs syndrome)   Osteomyelitis (HCC)   Obesity, Class III,  BMI 40-49.9 (morbid obesity) (HCC)   Acute on chronic diastolic CHF (congestive heart failure) (HCC)   AF (paroxysmal atrial fibrillation) ([place])   Uncontrolled type 2 diabetes mellitus with hyperglycemia, with long-term current use of insulin (HCC)   Malnutrition of moderate degree   Generalized weakness   Acute metabolic encephalopathy   Acute respiratory failure with hypoxia and hypercapnia (HCC)   OSA (obstructive sleep apnea)   Acute delirium   Acute on chronic respiratory failure with hypoxia and hypercapnia (HCC)   Anemia   Acute urinary retention    1. Acute on chronic diastolic congestive heart failure, HFpEF, LVEF 50-55% 05/10/2021, good diuresis after IV furosemide but remains on supplemental O2 2.  Paroxysmal atrial fibrillation/atrial flutter, currently in atrial flutter heart rate 109 bpm, on amiodarone and metoprolol for rate control and Eliquis for stroke prevention, cardizem '360mg'$  CD 3.  Coronary artery disease, status post CABG 2006, PCI RCA 01/2020, currently without chest pain 4.  Left foot osteomyelitis on IV vancomycin, left BKA 6/6 with Dr. Delana Meyer 5.  Acute kidney injury, improving to Cr 1.03 and >60  6.  MRSA bacteremia, osteomyelitis of L foot likely source, on IV daptomycin, repeat echo ordered by ID, resulted with thickened aortic and mitral leaflets without obvious vegetation. Will need 4 weeks of Abx, per ID   Recommendations   1.  Agree with current therapy 2.  Continue IV lasix '40mg'$  BID, BMP daily  3.  Eliquis '5mg'$  BID restarted 6/9 for stroke risk reduction  4.  Continue amiodarone, metoprolol tartrate, and cardizem for rate and rhythm control 5.  S/p Left BKA 6/6 6.  Pending rehab bed placement   This patient's plan of care was discussed and created with Dr. Clayborn Bigness and he is in agreement.    Tristan Schroeder, PA-C 10/10/2021 9:37 AM

## 2021-10-10 NOTE — Progress Notes (Signed)
PT Cancellation Note  Patient Details Name: [name]. MRN: [id number] DOB: 07/02/1955   Cancelled Treatment:    Reason Eval/Treat Not Completed: Medical issues which prohibited therapy;Fatigue/lethargy limiting ability to participate;Patient's level of consciousness (Pt in bed, meal finished. Pt more drowsy and sleepy today, mentation about the same (quite limited). Bed is soiled with loose stool. NSG made aware of need for cleanup. Will attempt PT again next date.)   3:56 PM, 10/10/21 Etta Grandchild, PT, DPT Physical Therapist - [facility]  [phone] [facility])  s  Etta Grandchild 10/10/2021, 3:56 PM

## 2021-10-10 NOTE — Progress Notes (Addendum)
Progress Note   Patient: [name]. ACZ:[id number] DOB: Nov 20, 1955 DOA: 09/28/2021     12 DOS: the patient was seen and examined on 10/10/2021   Brief hospital course: Keola A Dinneen Brooke Bonito. is a 66 y.o. male with medical history significant for chronic diastolic dysfunction CHF with LVEF of 50 to 55%, hypertension, coronary artery disease, depression, COPD, A-fib, morbid obesity (BMI 49), obstructive sleep apnea, status post recent wound debridement and partial calcanectomy (05/16) currently on oral antibiotic therapy after completion of IV who presents to the ER for evaluation of bilateral lower extremity swelling as well as increased redness and swelling involving the left lower extremity mostly over the heel. MRI of the foot showed left foot osteomyelitis, placed on vancomycin and Zosyn. Patient also has evidence of acute on chronic diastolic congestive heart failure.  Started IV Lasix. Patient also had a blood culture positive for MRSA secondary to osteomyelitis.  Left BKA performed on 6/6.  Currently patient on daptomycin as per ID to treat MRSA sepsis and will need to be on for a total of 4 weeks.  Repeat blood cultures negative.  Acute delirium during the entire hospitalization.  Mental status seems to wax and wane.  This morning answering questions appropriately.  Continue Seroquel at night and BiPAP at night.  Foley catheter placed on 6/11 secondary to urinary retention.  Flomax increased to twice a day dosing and I added Proscar.  Recommend a week of Foley catheter and then voiding trial after that.  Since Cardizem CD can cause urinary retention and decrease the dose down from 360 to 240.  I had to increase metoprolol 250 mg twice a day.  Continue amiodarone and Eliquis.  Assessment and Plan: * Sepsis due to methicillin resistant Staphylococcus aureus (MRSA) ([place]) MRSA growing out of blood cultures on 09/28/2021.  Repeat blood cultures negative.  Currently on daptomycin.   Source of infection likely osteomyelitis.  Will need 4 weeks total of antibiotics total.  Acute delirium Mental status has waxed and waned through the hospital course.  Today it was better this morning than yesterday.  Patient answered questions appropriately.  The patient is not complaining of any pain and has not been taking pain medications.  The patient's wife hesitant about stopping Mirapex and gabapentin at this point.  Stressed to nursing staff of the importance of BiPAP at night to blow off carbon dioxide..  Continue decreased dose of Seroquel down to 25 mg at night.  Osteomyelitis ([place]) Status post left BKA on 10/03/2021. Patient on IV daptomycin.    Acute urinary retention Foley catheter placed on 10/08/2021.  Continue Foley catheter for a week and then can do a voiding trial.  Increased Flomax to twice daily dosing and continue Proscar.  Acute on chronic diastolic CHF (congestive heart failure) ([place]) Patient's last known LVEF is 55 to 60% from a 2D echocardiogram which was done 01/23. Continue Lasix IV.  Patient also on metoprolol.  Uncontrolled type 2 diabetes mellitus with hyperglycemia, with long-term current use of insulin (HCC) Last hemoglobin A1c in the chart of 10.2.  Continue Semglee insulin to 21 units twice a day.  Continue short acting insulin prior to meals plus sliding scale.  Acute on chronic respiratory failure with hypoxia and hypercapnia (HCC) The patient needs BiPAP at night in order to have a good mental status.  Last PCO2 64 so qualifies for noninvasive ventilation at rehab.  AF (paroxysmal atrial fibrillation) (HCC) Continue amiodarone for rate control.  Eliquis for anticoagulation.  With urinary retention I decreased the dose of Cardizem CD down to 240 mg daily.  I increase the metoprolol 150 mg twice daily.  Heart rates around 110.  Obesity, Class III, BMI 40-49.9 (morbid obesity) (HCC) BMI 45.99  Essential hypertension, benign Continue metoprolol and  diltiazem and Lasix  RLS (restless legs syndrome) Continue pramipexole and gabapentin  Anemia Hemoglobin up to 8.7 today.  Continue to monitor closely being on blood thinners.    OSA (obstructive sleep apnea) Usually uses CPAP at night but will convert to BiPAP with CO2 retention.  Candidate for noninvasive ventilation with elevated PCO2 on ABG.        Subjective: Patient stated he felt good.  Answering questions appropriately.  States he likes to eat fruit and he had some with breakfast.  Physical Exam: Vitals:   10/10/21 0600 10/10/21 0909 10/10/21 1239 10/10/21 1708  BP:  128/85 100/65 99/67  Pulse:  (!) 108 (!) 108 (!) 107  Resp:  '20 20 20  '$ Temp:   98 F (36.7 C)   TempSrc:   Oral Axillary  SpO2:  100% 99% 92%  Weight: (!) 171.4 kg     Height:       Physical Exam HENT:     Head: Normocephalic.  Eyes:     General: Lids are normal.     Conjunctiva/sclera: Conjunctivae normal.  Cardiovascular:     Rate and Rhythm: Tachycardia present. Rhythm irregularly irregular.     Heart sounds: Normal heart sounds, S1 normal and S2 normal.  Pulmonary:     Breath sounds: No decreased breath sounds, wheezing, rhonchi or rales.  Abdominal:     Palpations: Abdomen is soft.     Tenderness: There is no abdominal tenderness.  Musculoskeletal:     Right lower leg: Edema present.     Left Lower Extremity: Left leg is amputated below knee.  Skin:    General: Skin is warm.     Findings: No rash.     Comments: Chronic lower extremity skin discoloration right lower leg. Slight redness over nose where BiPAP was placed.  Neurological:     Mental Status: He is alert.     Comments: Patient answers a few questions but does not elaborate much today.     Data Reviewed: Creatinine 0.7, hemoglobin 8.9  Family Communication: Spoke with patient's wife on the phone  Disposition: Status is: Inpatient Remains inpatient appropriate because: Will need rehab upon disposition.  TOC working on  plans.  Will need BiPAP at facility.  Planned Discharge Destination: Rehab  Author: Loletha Grayer, MD 10/10/2021 5:12 PM  For on call review [email or web address].

## 2021-10-10 NOTE — Progress Notes (Signed)
Occupational Therapy Treatment Patient Details Name: [name]. MRN: [id number] DOB: 15-Aug-1955 Today's Date: 10/10/2021   History of present illness [name] is a 48yoM who comes [facility] on 09/28/21 c progression of Left chornic heel ulcer. MRI showed significant progressive osteomyelitis in the left calcaneus. PMH HTN, IDDM2, morbid obesity, depression, CAD s/p CABG 2006 and PCI to RCA in 12/2020, chronic B venous stasis on Unna boots, chronic L foot ulcer, COPD, OSA on CPAP nightly, ai-fib, PAD, s/p multiple toe amputations.  S/p L BKA on 10/03/21.   OT comments  Pt seen for OT treatment on this date. Upon arrival to room pt awake and alert laying upright in bed. Pt reported no pain at start of session. Pt agreeable to tx focusing on bed mobility and grooming tasks. Pt required TOTAL A x2 for supine<>sit. Pt sat at EOB for ~5-10 min with SETUP to obtain items and perform grooming task- needs MAX A for trunk control if sitting EOB and MOD A to don/doff hospital gown - needs assistance with threading/unthreading RUE. Pt making good progress toward goals. Pt requested assistance with calling wife, therapist located phone number in pt chart and instructed pt on how to dial number on hospital phone. Pt continues to benefit from skilled OT services to maximize return to PLOF and minimize risk of future falls, injury. Will continue to follow POC. Discharge recommendation remains appropriate.     Recommendations for follow up therapy are one component of a multi-disciplinary discharge planning process, led by the attending physician.  Recommendations may be updated based on patient status, additional functional criteria and insurance authorization.    Follow Up Recommendations  Skilled nursing-short term rehab (<3 hours/day)    Assistance Recommended at Discharge Frequent or constant Supervision/Assistance  Patient can return home with the following  A lot of help with  bathing/dressing/bathroom;Assistance with cooking/housework;Help with stairs or ramp for entrance;Assist for transportation;Two people to help with walking and/or transfers   Equipment Recommendations  Other (comment) (defer to next venue of care)    Recommendations for Other Services      Precautions / Restrictions Precautions Precautions: Fall Precaution Comments: Contact precautions       Mobility Bed Mobility Overal bed mobility: Needs Assistance Bed Mobility: Supine to Sit, Sit to Supine     Supine to sit: Total assist, +2 for physical assistance Sit to supine: Total assist, +2 for physical assistance        Transfers                   General transfer comment: deferred for safety     Balance Overall balance assessment: Needs assistance Sitting-balance support: Feet supported, Bilateral upper extremity supported Sitting balance-Leahy Scale: Poor Sitting balance - Comments: Pt required MOD/MAX A to maintain sitting balance at EOB while brushing teeth.                                   ADL either performed or assessed with clinical judgement   ADL Overall ADL's : Needs assistance/impaired                                       General ADL Comments: Pt required SETUP to obtain items and perform grooming task while seated - needs MAX A for trunk control if sitting EOB.  MOD A to don/doff hospital gown - needs assistance with threading/unthreading RUE.      Cognition Arousal/Alertness: Awake/alert Behavior During Therapy: WFL for tasks assessed/performed Overall Cognitive Status: Impaired/Different from baseline Area of Impairment: Attention, Orientation                 Orientation Level: Time Current Attention Level: Focused           General Comments: Pt required vcs to identify time of day and attending to tasks.                   Pertinent Vitals/ Pain       Pain Assessment Pain Assessment: No/denies  pain   Frequency  Min 2X/week        Progress Toward Goals  OT Goals(current goals can now be found in the care plan section)  Progress towards OT goals: Progressing toward goals  Acute Rehab OT Goals Patient Stated Goal: To get better OT Goal Formulation: With patient Time For Goal Achievement: 10/13/21 Potential to Achieve Goals: Good ADL Goals Pt Will Perform Lower Body Dressing: with min assist;sitting/lateral leans Pt Will Transfer to Toilet: with min assist Pt Will Perform Toileting - Clothing Manipulation and hygiene: with min assist  Plan Discharge plan remains appropriate;Frequency remains appropriate       AM-PAC OT "6 Clicks" Daily Activity     Outcome Measure   Help from another person eating meals?: None Help from another person taking care of personal grooming?: A Lot Help from another person toileting, which includes using toliet, bedpan, or urinal?: Total Help from another person bathing (including washing, rinsing, drying)?: Total Help from another person to put on and taking off regular upper body clothing?: A Lot Help from another person to put on and taking off regular lower body clothing?: Total 6 Click Score: 11    End of Session Equipment Utilized During Treatment: Oxygen  OT Visit Diagnosis: Unsteadiness on feet (R26.81);Repeated falls (R29.6);Muscle weakness (generalized) (M62.81)   Activity Tolerance Patient tolerated treatment well;Patient limited by fatigue   Patient Left in bed;with call bell/phone within reach;with bed alarm set   Nurse Communication Other (comment) (drained 1000 mL of urine from foley)        Time: 7989-2119 OT Time Calculation (min): 31 min  Charges: OT General Charges $OT Visit: 1 Visit OT Treatments $Self Care/Home Management : 8-22 mins $Therapeutic Activity: 8-22 mins  [employer] OTDS  Stafford Davin Archuletta 10/10/2021, 4:06 PM

## 2021-10-10 NOTE — TOC Progression Note (Addendum)
Transition of Care (TOC) - Progression Note    Patient Details  Name: [name]. MRN: [id number] Date of Birth: [birth year]  Transition of Care [facility]) CM/SW [place], [place] Phone Number: 10/10/2021, 9:13 AM  Clinical Narrative:     CSW followed up with Ebony Hail at [facility] who is reviewing patient referral for bed offer to ensure they have a bed to meet patient's needs. Pending acceptance for bed offer at this time. Patient has no bed offers currently after referrals have been faxed to over 50 facilities.   LVM with patient's spouse [name] to attempt to update on above.    Expected Discharge Plan: [place] Barriers to Discharge: Continued Medical Work up  Expected Discharge Plan and Services Expected Discharge Plan: [place] In-house Referral: NA   Post Acute Care Choice: Resumption of Svcs/PTA Provider Living arrangements for the past 2 months: Single Family Home                                       Social Determinants of Health (SDOH) Interventions    Readmission Risk Interventions    09/29/2021   11:12 AM  Readmission Risk Prevention Plan  Transportation Screening Complete  PCP or Specialist Appt within 3-5 Days Complete  HRI or [place] Complete  Social Work Consult for [place] Planning/Counseling Complete  Palliative Care Screening Not Applicable  Medication Review Press photographer) Complete

## 2021-10-11 DIAGNOSIS — A4102 Sepsis due to Methicillin resistant Staphylococcus aureus: Secondary | ICD-10-CM | POA: Diagnosis not present

## 2021-10-11 DIAGNOSIS — J9621 Acute and chronic respiratory failure with hypoxia: Secondary | ICD-10-CM | POA: Diagnosis not present

## 2021-10-11 DIAGNOSIS — M86672 Other chronic osteomyelitis, left ankle and foot: Secondary | ICD-10-CM | POA: Diagnosis not present

## 2021-10-11 DIAGNOSIS — I480 Paroxysmal atrial fibrillation: Secondary | ICD-10-CM | POA: Diagnosis not present

## 2021-10-11 LAB — GLUCOSE, CAPILLARY
Glucose-Capillary: 106 mg/dL — ABNORMAL HIGH (ref 70–99)
Glucose-Capillary: 209 mg/dL — ABNORMAL HIGH (ref 70–99)
Glucose-Capillary: 242 mg/dL — ABNORMAL HIGH (ref 70–99)
Glucose-Capillary: 234 mg/dL — ABNORMAL HIGH (ref 70–99)

## 2021-10-11 LAB — BASIC METABOLIC PANEL
Anion gap: 7 (ref 5–15)
BUN: 21 mg/dL (ref 8–23)
CO2: 35 mmol/L — ABNORMAL HIGH (ref 22–32)
Calcium: 8.6 mg/dL — ABNORMAL LOW (ref 8.9–10.3)
Chloride: 95 mmol/L — ABNORMAL LOW (ref 98–111)
Creatinine, Ser: 0.63 mg/dL (ref 0.61–1.24)
GFR, Estimated: >60 mL/min (ref >60)
Glucose, Bld: 94 mg/dL (ref 70–99)
Potassium: 5.2 mmol/L — ABNORMAL HIGH (ref 3.5–5.1)
Sodium: 137 mmol/L (ref 135–145)

## 2021-10-11 LAB — CBC
HCT: 32.1 % — ABNORMAL LOW (ref 39.0–52.0)
Hemoglobin: 9.3 g/dL — ABNORMAL LOW (ref 13.0–17.0)
MCH: 28 pg (ref 26.0–34.0)
MCHC: 29 g/dL — ABNORMAL LOW (ref 30.0–36.0)
MCV: 96.7 fL (ref 80.0–100.0)
Platelets: 342 10*3/uL (ref 150–400)
RBC: 3.32 MIL/uL — ABNORMAL LOW (ref 4.22–5.81)
RDW: 16.5 % — ABNORMAL HIGH (ref 11.5–15.5)
WBC: 10.1 10*3/uL (ref 4.0–10.5)
nRBC: 0 % (ref 0.0–0.2)

## 2021-10-11 NOTE — Progress Notes (Signed)
Nutrition Follow-up  DOCUMENTATION CODES:   Morbid obesity, Non-severe (moderate) malnutrition in context of acute illness/injury  INTERVENTION:   -Continue Glucerna Shake po BID, each supplement provides 220 kcal and 10 grams of protein  -Continue Ensure Max po daily, each supplement provides 150 kcal and 30 grams of protein -Continue MVI with minerals daily -Continue 500 mg vitamin C BID -Continue 220 mg zinc sulfate x 14 days  NUTRITION DIAGNOSIS:   Moderate Malnutrition related to acute illness (L heel osteomyelitis) as evidenced by energy intake < 75% for > 7 days, mild fat depletion.  Ongoing  GOAL:   Patient will meet greater than or equal to 90% of their needs  Progressing   MONITOR:   PO intake, Supplement acceptance, Labs, Weight trends, I & O's  REASON FOR ASSESSMENT:   Consult Wound healing  ASSESSMENT:   Pt admitted with wound infection. Found to have osteomyelitis of L heel. PMH significant for uncontrolled R4BU, chronic diastolic dysfunction CHF, HTN, CAD, COPD, afib, morbid obesity, OSA, s/p recent wound debridement and partial calcanectomy (05/16).  Reviewed I/O's: -3.3 L x 24 hours and -17.2 L since admission  UOP: 3.5 L x 24 hours  6/6- s/p lt BKA   Pt unavailable at time of visit. Attempted to speak with pt via call to hospital room phone, however, unable to reach.   Per chart review, pt still with some delirium at times, but improved yesterday per MD notes.   Per vascular notes, BKA site healing well.   Pt with good appetite. Noted meal completions 25-100%. Pt is taking Glucerna and Ensure Max supplements.   Per TOC notes, plan for SNF placement at discharge.   Medications reviewed and include vitamin D and cardizem.   Labs reviewed: K: 5.2, CBGS: 106-229 (inpatient orders for glycemic control are 0-20 units insulin aspart TID with meals, 6 units insulin aspart TID with meals, and 21 units insulin glargine-yfgn BID).    Diet Order:    Diet Order             Diet Carb Modified Fluid consistency: Thin; Room service appropriate? Yes  Diet effective now                   EDUCATION NEEDS:   Not appropriate for education at this time  Skin:  Skin Assessment: Skin Integrity Issues: Skin Integrity Issues:: Other (Comment), Incisions Wound Vac: - Incisions: s/p lt BKA Other: venous statsis ulcer to rt pretibial, MASD to abdominal folds and groin  Last BM:  10/11/21 (type 4)  Height:   Ht Readings from Last 1 Encounters:  10/03/21 '6\' 4"'$  (1.93 m)    Weight:   Wt Readings from Last 1 Encounters:  10/11/21 (!) 166.3 kg    Ideal Body Weight:  91.8 kg  BMI:  Body mass index is 44.64 kg/m.  Estimated Nutritional Needs:   Kcal:  2100-2300  Protein:  120-140 grams  Fluid:  > 2 L    Loistine Chance, RD, LDN, [place] Registered Dietitian II Certified Diabetes Care and Education Specialist Please refer to [facility] for RD and/or RD on-call/weekend/after hours pager

## 2021-10-11 NOTE — Progress Notes (Signed)
Progress Note   Patient: [name]. HYW:[id number] DOB: Jun 21, 1955 DOA: 09/28/2021     13 DOS: the patient was seen and examined on 10/11/2021   Brief hospital course: Alec A Turton Brooke Bonito. is a 66 y.o. male with medical history significant for chronic diastolic dysfunction CHF with LVEF of 50 to 55%, hypertension, coronary artery disease, depression, COPD, A-fib, morbid obesity (BMI 49), obstructive sleep apnea, status post recent wound debridement and partial calcanectomy (05/16) currently on oral antibiotic therapy after completion of IV who presents to the ER for evaluation of bilateral lower extremity swelling as well as increased redness and swelling involving the left lower extremity mostly over the heel. MRI of the foot showed left foot osteomyelitis, placed on vancomycin and Zosyn. Blood culture positive for MRSA secondary to osteomyelitis.   Left BKA performed on 6/6.  Patient also has evidence of acute on chronic diastolic congestive heart failure.  Started IV Lasix.  Currently patient on daptomycin as per ID to treat MRSA sepsis and will need to be on for a total of 4 weeks.  Repeat blood cultures negative.  Acute delirium during the entire hospitalization.  Mental status seems to wax and wane.  Answering questions appropriately.  Continue Seroquel at night and BiPAP at night.  Foley catheter placed on 6/11 secondary to urinary retention.  Flomax increased to twice a day dosing and added Proscar.  Recommend a week of Foley catheter and then voiding trial after that.  Since Cardizem CD can cause urinary retention and decrease the dose down from 360 to 240.  Increased metoprolol 250 mg twice a day.   Continue amiodarone and Eliquis.  Assessment and Plan: * Sepsis due to methicillin resistant Staphylococcus aureus (MRSA) ([place]) MRSA growing out of blood cultures on 09/28/2021.  Repeat blood cultures negative.  Currently on daptomycin.  Source of infection likely osteomyelitis.   Will need 4 weeks total of antibiotics total.  Acute delirium Mental status has waxed and waned through the hospital course.  Patient answers questions appropriately.  Not complaining of any pain and has not been taking pain medications.  Per Dr. Leslye Peer, patient's wife hesitant about stopping Mirapex and gabapentin at this point.  Stressed to nursing staff of the importance of BiPAP to prevent CO2 retention.  Continue decreased dose of Seroquel down to 25 mg at night.  CT scan of the head negative on 10/06/2021. Delirium precautions  Osteomyelitis ([place]) Status post left BKA on 10/03/2021. Patient on IV daptomycin. Antibiotics per ID    Acute urinary retention Foley catheter placed on 10/08/2021.  Continue Foley catheter for a week and then can do a voiding trial.  Increased Flomax to twice daily dosing and continue Proscar.  Acute on chronic diastolic CHF (congestive heart failure) ([place]) Patient's last known LVEF is 55 to 60% from a 2D echocardiogram which was done 01/23.  Net IO Since Admission: -17,409.44 mL [10/11/21 1439] --Continue Lasix IV 40 mg twice daily.  --Continue metoprolol.  Uncontrolled type 2 diabetes mellitus with hyperglycemia, with long-term current use of insulin (HCC) Uncontrolled.  Last hemoglobin A1c in the chart of 10.2.  Continue Semglee insulin to 21 units twice a day.  Continue short acting insulin prior to meals plus sliding scale.  Acute on chronic respiratory failure with hypoxia and hypercapnia (HCC) Requires BiPAP at night in order to to prevent CO2 narcosis and encephalopathy.   Last PCO2 64  -- qualifies for NIV at rehab.  AF (paroxysmal atrial fibrillation) (HCC) Continue amiodarone  for rate control.  Eliquis for anticoagulation.   Due to urinary retention, decreased Cardizem CD down to 240 mg daily, increased metoprolol 150 mg twice daily.  Heart rates persistently around 110.  Obesity, Class III, BMI 40-49.9 (morbid obesity) (HCC) BMI  42.35 Complicates overall care and prognosis.  Recommend lifestyle modifications including physical activity and diet for weight loss and overall long-term health.   Essential hypertension, benign Continue metoprolol, Cardizem and Lasix  RLS (restless legs syndrome) Continue pramipexole and gabapentin  Anemia Hemoglobin up to 9.3 today.  Continue to monitor closely being on blood thinners.    OSA (obstructive sleep apnea) Previously using CPAP at night. Transition to BiPAP secondary to CO2 retention.  Qualifies for NIV with elevated PCO2 on ABG.  Acute respiratory failure with hypoxia and hypercapnia (HCC) See above  Acute metabolic encephalopathy See acute delirium  Generalized weakness PT and OT recommending SNF. Fall precautions Continue PT OT here Out of bed to chair, mobilize as tolerated  Malnutrition of moderate degree Related to acute illness (left heel osteomyelitis) and energy intake of Wood than 75% for more than 7 days, mild fat depletion. -- Appreciate recommendations from dietitian -- Started on Glucerna and ensures, vitamins        Subjective: Patient is up in recliner when seen on rounds.  He was reporting his bottom hurting and wanting to return to bed.  He denies having any pain.  Reports not using oxygen at home.  No other acute complaints at this time.  He seems mildly confused but answers all my questions appropriately.  Acute events reported.  Physical Exam: Vitals:   10/11/21 0500 10/11/21 0600 10/11/21 0735 10/11/21 1130  BP: 119/85 119/82 108/76 106/73  Pulse: (!) 124  (!) 109 (!) 107  Resp: (!) '23 19 17 20  '$ Temp:   98.1 F (36.7 C) 98 F (36.7 C)  TempSrc:   Axillary Oral  SpO2: 100%  98% 96%  Weight:      Height:       General exam: awake, alert, no acute distress, morbidly obese HEENT: atraumatic, clear conjunctiva, anicteric sclera, moist mucus membranes, hearing grossly normal  Respiratory system: CTAB diminished bases due to body  habitus, no wheezes, rales or rhonchi, normal respiratory effort. Cardiovascular system: normal S1/S2, RRR, unable to visualize JVD   Gastrointestinal system: soft, NT, ND, sounds present Central nervous system: Alert, oriented to self and place no gross focal neurologic deficits, normal speech Extremities: Left BKA incision well approximated without surrounding signs of infection, right lower extremity with clean dry intact gauze dressing with severe venous stasis changes approximately, no edema, normal tone Skin: dry, intact, normal temperature Psychiatry: normal mood, congruent affect   Data Reviewed:  Notable labs: Potassium 5.2, chloride 95, bicarb 35, calcium 8.6, hemoglobin 9.3  Family Communication: None at bedside, will attempt to call  Disposition: Status is: Inpatient Remains inpatient appropriate because: Remains on IV antibiotics, IV diuretics and requires SNF placement for rehab   Planned Discharge Destination: Skilled nursing facility    Time spent: 40 minutes  Author: Ezekiel Slocumb, DO 10/11/2021 2:43 PM  For on call review [email or web address].

## 2021-10-11 NOTE — TOC Progression Note (Addendum)
Transition of Care (TOC) - Progression Note    Patient Details  Name: [name]. MRN: [id number] Date of Birth: Dec 13, 1955  Transition of Care [facility]) CM/SW Contact  Laurena Slimmer, RN Phone Number: 10/11/2021, 1:50 PM  Clinical Narrative:    Bed offer received from [facility]. Per facility admissions coordinator a Bari bed and BIPAP would need to be ordered. Contacted patient's wife, [name] to advise of bed offer,required insurance authorization and anticipated discharge date. Patient wife belligerently expressed concerns about the facility receiving undesirable ratings and distance. Patient's wife advised the current bed offer was the only one received after numerous other facility inquiries in the local area and surrounding counties. This CM explained level of care as it pertains to meeting medical necessity for continued stay at this facility. Options presented to accept current bed offer or have patient return home. Patient's wife refused bed offer in addition to stating stating she could not care for the patient at home. Patient wife disconnected call.   2:47pm  Retrieved incoming call from [phone]. Spoke with customer service representative, Pam L. regarding conversation between she and patient's wife. Pam inquired about other facilities and denials for bed offers. Advised of reasons provided that ranged from availability to not being able to meet the patient's needs. Pam stated patient wife was upset. Jeral Pinch of patient' right to appeal at time of discharge if she did not want to accept the bed offer. Advised patient's wife disconnected previous call.   3:27pm Spoke with patient's wife regarding discharge. Wife requesting list of facilities faxed and reasons for denial. Will provide list of facilities faxed.   Expected Discharge Plan: [place] Barriers to Discharge: Continued Medical Work up  Expected Discharge Plan and Services Expected  Discharge Plan: [place] In-house Referral: NA   Post Acute Care Choice: Resumption of Svcs/PTA Provider Living arrangements for the past 2 months: Single Family Home                                       Social Determinants of Health (SDOH) Interventions    Readmission Risk Interventions    09/29/2021   11:12 AM  Readmission Risk Prevention Plan  Transportation Screening Complete  PCP or Specialist Appt within 3-5 Days Complete  HRI or [place] Complete  Social Work Consult for [place] Planning/Counseling Complete  Palliative Care Screening Not Applicable  Medication Review Press photographer) Complete

## 2021-10-11 NOTE — Progress Notes (Signed)
Physical Therapy Treatment Patient Details Name: [name]. MRN: [id number] DOB: Apr 24, 1956 Today's Date: 10/11/2021   History of Present Illness Lamar Meter is a 35yoM who [facility] on [facility]21 c progression of Left chornic heel ulcer. MRI showed significant progressive osteomyelitis in the left calcaneus. PMH HTN, IDDM2, morbid obesity, depression, CAD s/p CABG 2006 and PCI to RCA in 12/2020, chronic B venous stasis on Unna boots, chronic L foot ulcer, COPD, OSA on CPAP nightly, ai-fib, PAD, s/p multiple toe amputations.  S/p L BKA on 10/03/21.    PT Comments    Pt was long sitting in bed upon arriving. He is alert and able to follow commands however author questions pt's overall cognition. He is not at baseline per family but is improving. Pt was able to roll R to short sit with max assist + increased time. Heavy use of bed functions. +2 assistance throughout session for safety. Attempted to stand from elevated bed height with +2 max assist however pt is unable to fully clear hips. Hoyer lift used to get pt OOB to recliner. Reviewed exercises to promote strengthening and return in function. He will need extensive PT going forwards to maximize independence with all ADLs. RN staff to contact author if assistance is needed with returning pt to bed.    Recommendations for follow up therapy are one component of a multi-disciplinary discharge planning process, led by the attending physician.  Recommendations may be updated based on patient status, additional functional criteria and insurance authorization.  Follow Up Recommendations  Skilled nursing-short term rehab (<3 hours/day)     Assistance Recommended at Discharge Frequent or constant Supervision/Assistance  Patient can return home with the following Two people to help with bathing/dressing/bathroom;Two people to help with walking and/or transfers;Assistance with cooking/housework;Assist for transportation;Help with stairs or ramp  for entrance;Direct supervision/assist for medications management;Direct supervision/assist for financial management   Equipment Recommendations  Other (comment) (defer to next level of care)       Precautions / Restrictions Precautions Precautions: Fall Precaution Comments: Contact precautions Restrictions Weight Bearing Restrictions: Yes LLE Weight Bearing: Non weight bearing Other Position/Activity Restrictions: LLE     Mobility  Bed Mobility Overal bed mobility: Needs Assistance Bed Mobility: Supine to Sit, Sit to Supine Rolling: Max assist   Supine to sit: Max assist Sit to supine: Total assist   General bed mobility comments: Pt does fully participate however is severely limited by weakness    Transfers Overall transfer level: Needs assistance Equipment used: Rolling walker (2 wheels) (needs a bariatric RW) Transfers: Sit to/from Stand Sit to Stand: Total assist, +2 physical assistance, +2 safety/equipment           General transfer comment: pt attempted standing EOB with +2 max assist. He gives great effort but is unable to fully achieve erect standing. Hoyer lift transfer to recliner after standing trials. Recommend use of mechanical lift for all OOB activity/transfers at this time.    Ambulation/Gait      General Gait Details: unable/unsafe to attempt.      Balance Overall balance assessment: Needs assistance Sitting-balance support:  (RLE support + BUE support) Sitting balance-Leahy Scale: Poor Sitting balance - Comments: CGA mostly however min assist at times due to posterior LOB  Standing balance comment: unable to achieve standing     Cognition Arousal/Alertness: Awake/alert Behavior During Therapy: WFL for tasks assessed/performed Overall Cognitive Status: Impaired/Different from baseline Area of Impairment: Attention, Orientation    Orientation Level: Time, Situation Current  Attention Level: Focused  General Comments: Pt is alert and  oriented x 2. Not at baseline cognition however seems to be improving/ clearing. He is able to follow commands with increased time           General Comments General comments (skin integrity, edema, etc.): Pt has fair amount of drainage from L amputee site. RN aware. RN staff to contacted author for assistance with returning to bed from recliner.      Pertinent Vitals/Pain Pain Assessment Pain Assessment: No/denies pain Faces Pain Scale: No hurt Pain Intervention(s): Limited activity within patient's tolerance, Monitored during session, Repositioned     PT Goals (current goals can now be found in the care plan section) Acute Rehab PT Goals Patient Stated Goal: none stated Progress towards PT goals: Progressing toward goals    Frequency    Min 2X/week      PT Plan Current plan remains appropriate       AM-PAC PT "6 Clicks" Mobility   Outcome Measure  Help needed turning from your back to your side while in a flat bed without using bedrails?: A Lot Help needed moving from lying on your back to sitting on the side of a flat bed without using bedrails?: Total Help needed moving to and from a bed to a chair (including a wheelchair)?: Total Help needed standing up from a chair using your arms (e.g., wheelchair or bedside chair)?: Total Help needed to walk in hospital room?: Total Help needed climbing 3-5 steps with a railing? : Total 6 Click Score: 7    End of Session Equipment Utilized During Treatment: Oxygen (sao2 pleth reading poor throughout session. RN made aware. HR stable) Activity Tolerance: Patient tolerated treatment well;Patient limited by fatigue Patient left: in chair;with call bell/phone within reach Nurse Communication: Mobility status PT Visit Diagnosis: Difficulty in walking, not elsewhere classified (R26.2);Other abnormalities of gait and mobility (R26.89)     Time: 6503-5465 PT Time Calculation (min) (ACUTE ONLY): 51 min  Charges:  $Therapeutic  Exercise: 8-22 mins $Therapeutic Activity: 23-37 mins                     Julaine Fusi PTA 10/11/21, 10:10 AM

## 2021-10-11 NOTE — Progress Notes (Signed)
[facility]  Daily Progress Note   Subjective  - 8 Days Post-Op  Patient remains alert but very confused  Objective Vitals:   10/11/21 0500 10/11/21 0600 10/11/21 0735 10/11/21 1130  BP: 119/85 119/82 108/76 106/73  Pulse: (!) 124  (!) 109 (!) 107  Resp: (!) '23 19 17 20  '$ Temp:   98.1 F (36.7 C) 98 F (36.7 C)  TempSrc:   Axillary Oral  SpO2: 100%  98% 96%  Weight:      Height:        Intake/Output Summary (Last 24 hours) at 10/11/2021 1205 Last data filed at 10/11/2021 1131 Gross per 24 hour  Intake 600 ml  Output 3500 ml  Net -2900 ml    PULM  Normal effort , no use of accessory muscles CV  No JVD, RRR Abd      No distended, nontender VASC  left below-knee amputation stable line is clean dry and intact.  The stump looks quite good  Laboratory CBC    Component Value Date/Time   WBC 10.1 10/11/2021 0637   HGB 9.3 (L) 10/11/2021 0637   HGB 14.7 07/12/2011 0919   HCT 32.1 (L) 10/11/2021 0637   HCT 45.4 07/12/2011 0919   PLT 342 10/11/2021 0637   PLT 156 07/12/2011 0919    BMET    Component Value Date/Time   NA 137 10/11/2021 0637   NA 141 07/12/2011 0919   K 5.2 (H) 10/11/2021 0637   K 4.4 07/12/2011 0919   CL 95 (L) 10/11/2021 0637   CL 102 07/12/2011 0919   CO2 35 (H) 10/11/2021 0637   CO2 29 07/12/2011 0919   GLUCOSE 94 10/11/2021 0637   GLUCOSE 76 07/12/2011 0919   BUN 21 10/11/2021 0637   BUN 16 05/05/2013 1022   CREATININE 0.63 10/11/2021 0637   CREATININE 0.71 05/05/2013 1022   CALCIUM 8.6 (L) 10/11/2021 0637   CALCIUM 8.8 07/12/2011 0919   GFRNONAA >60 10/11/2021 0637   GFRNONAA >60 05/05/2013 1022   GFRAA >60 01/06/2020 1853   GFRAA >60 05/05/2013 1022    Assessment/Planning: POD #8 s/p left below-knee amputation  From a surgical standpoint patient is doing well there are no changes   Hortencia Pilar  10/11/2021, 12:05 PM

## 2021-10-11 NOTE — Assessment & Plan Note (Addendum)
Rehab recommended by PT.  Unfortunately no rehab beds.  Try to get out of bed to chair.

## 2021-10-11 NOTE — Assessment & Plan Note (Signed)
See above

## 2021-10-11 NOTE — Assessment & Plan Note (Addendum)
Related to acute illness (left heel osteomyelitis) and energy intake of less than 75% for more than 7 days, mild fat depletion. -- Glucerna and ensures, vitamins

## 2021-10-11 NOTE — Progress Notes (Signed)
[facility]  Patient ID: [name]. MRN: [id number] DOB/AGE: 67-Nov-1957 67 y.o.   Admit date: 09/28/2021 Referring Physician Dr. Francine Wood Primary Physician Dr. Felipa Wood Primary Cardiologist Dr. Donnelly Wood Reason for Consultation acute on chronic HFpEF    HPI: [name]. is a [phone] with a PMH significant for CAD s/p CABG 2006 and PCI of RCA 01/2020, HFpEF (LVEF 50-55% 04/2021), A. fib / flutter on apixaban, amiodarone and digoxin, HTN, BPH, morbid obesity, poorly controlled type 2 diabetes,  PAD s/p left third toe amputation, chronic venous stasis, OSA on CPAP who presented to [name] ED on 09/28/21 with increasing pain and swelling in his left leg, and imaging concerning for worsening osteomyelitis of his left foot. He has has onoging infections needing wound debridement in his left lower leg over the past several months. Cardiology is consulted for assistance with his heart failure.   Interval History:  -no acute events, diuresing well (net negative 13L so far) -no complaints, eating lunch.  -not excited about rehab.    Vitals:   10/11/21 0500 10/11/21 0600 10/11/21 0735 10/11/21 1130  BP: 119/85 119/82 108/76 106/73  Pulse: (!) 124  (!) 109 (!) 107  Resp: (!) '23 19 17 20  '$ Temp:   98.1 F (36.7 C) 98 F (36.7 C)  TempSrc:   Axillary Oral  SpO2: 100%  98% 96%  Weight:      Height:         Intake/Output Summary (Last 24 hours) at 10/11/2021 1249 Last data filed at 10/11/2021 1131 Gross per 24 hour  Intake 600 ml  Output 3900 ml  Net -3300 ml    PHYSICAL EXAM General: Ill-appearing Caucasian male,  in no acute distress. Sitting upright in PCU bed eating lunch.  HEENT:  Normocephalic and atraumatic. Neck:   No JVD.  Lungs: Normal respiratory effort on 2L by [name]. Clear bilaterally to auscultation. No wheezes, crackles, rhonchi.  Heart: Tachy but regular. Normal S1 and S2 without gallops or murmurs.  Abdomen: Obese appearing.  Msk: Normal strength  and tone for age. Extremities:  Significant bilateral lower extremity edema, RLE with venous stasis changes and woody appearance to skin, improving slightly with Wood tightness Left leg with dry padded dressing Neuro: Alert and oriented x3 Psych: answers questions appropriately    LABS: Basic Metabolic Panel: Recent Labs    10/10/21 0436 10/11/21 0637  NA 141 137  K 4.9 5.2*  CL 99 95*  CO2 38* 35*  GLUCOSE 130* 94  BUN 26* 21  CREATININE 0.70 0.63  CALCIUM 8.6* 8.6*    Liver Function Tests: No results for input(s): "AST", "ALT", "ALKPHOS", "BILITOT", "PROT", "ALBUMIN" in the last 72 hours.  No results for input(s): "LIPASE", "AMYLASE" in the last 72 hours. CBC: Recent Labs    10/10/21 0436 10/11/21 0637  WBC 7.1 10.1  HGB 8.9* 9.3*  HCT 31.5* 32.1*  MCV 94.9 96.7  PLT 315 342    Cardiac Enzymes: Recent Labs    10/09/21 0453  CKTOTAL 30*    BNP: Invalid input(s): "POCBNP" D-Dimer: No results for input(s): "DDIMER" in the last 72 hours. Hemoglobin A1C: No results for input(s): "HGBA1C" in the last 72 hours. Fasting Lipid Panel: No results for input(s): "CHOL", "HDL", "LDLCALC", "TRIG", "CHOLHDL", "LDLDIRECT" in the last 72 hours. Thyroid Function Tests: No results for input(s): "TSH", "T4TOTAL", "T3FREE", "THYROIDAB" in the last 72 hours.  Invalid input(s): "FREET3" Anemia Panel: No results for input(s): "VITAMINB12", "FOLATE", "FERRITIN", "TIBC", "  IRON", "RETICCTPCT" in the last 72 hours.  No results found.   Echo LVEF 50-55% 05/10/2021  TELEMETRY: Atrial fibrillation 111 bpm:  ASSESSMENT AND PLAN:  Principal Problem:   Sepsis due to methicillin resistant Staphylococcus aureus (MRSA) ([name]) Active Problems:   Essential hypertension, benign   RLS (restless legs syndrome)   Osteomyelitis (HCC)   Obesity, Class III, BMI 40-49.9 (morbid obesity) (HCC)   Acute on chronic diastolic CHF (congestive heart failure) (HCC)   AF (paroxysmal atrial  fibrillation) ([place])   Uncontrolled type 2 diabetes mellitus with hyperglycemia, with long-term current use of insulin (HCC)   Malnutrition of moderate degree   Generalized weakness   Acute metabolic encephalopathy   Acute respiratory failure with hypoxia and hypercapnia (HCC)   OSA (obstructive sleep apnea)   Acute delirium   Acute on chronic respiratory failure with hypoxia and hypercapnia (HCC)   Anemia   Acute urinary retention    1. Acute on chronic diastolic congestive heart failure, HFpEF, LVEF 50-55% 05/10/2021, good diuresis after IV furosemide but remains on supplemental O2 2.  Paroxysmal atrial fibrillation/atrial flutter, currently in atrial flutter heart rate 109 bpm, on amiodarone and metoprolol for rate control and Eliquis for stroke prevention, cardizem '360mg'$  CD 3.  Coronary artery disease, status post CABG 2006, PCI RCA 01/2020, currently without chest pain 4.  Left foot osteomyelitis on IV vancomycin, left BKA 6/6 with Dr. Delana Wood 5.  Acute kidney injury, improving to Cr 1.03 and >60  6.  MRSA bacteremia, osteomyelitis of L foot likely source, on IV daptomycin, repeat echo ordered by ID, resulted with thickened aortic and mitral leaflets without obvious vegetation. Will need 4 weeks of Abx, per ID   Recommendations   1.  Agree with current therapy 2.  Continue IV lasix '40mg'$  BID, BMP daily  3.  Eliquis '5mg'$  BID restarted 6/9 for stroke risk reduction  4.  Continue amiodarone, metoprolol tartrate, and cardizem for rate and rhythm control 5.  S/p Left BKA 6/6 6.  Pending rehab bed placement   This patient's plan of care was discussed and created with Dr. Clayborn Wood and he is in agreement.    Alec Schroeder, PA-C 10/11/2021 12:49 PM

## 2021-10-11 NOTE — Assessment & Plan Note (Deleted)
See acute delirium 

## 2021-10-12 DIAGNOSIS — A4102 Sepsis due to Methicillin resistant Staphylococcus aureus: Secondary | ICD-10-CM | POA: Diagnosis not present

## 2021-10-12 DIAGNOSIS — I5033 Acute on chronic diastolic (congestive) heart failure: Secondary | ICD-10-CM | POA: Diagnosis not present

## 2021-10-12 DIAGNOSIS — J9621 Acute and chronic respiratory failure with hypoxia: Secondary | ICD-10-CM | POA: Diagnosis not present

## 2021-10-12 DIAGNOSIS — R338 Other retention of urine: Secondary | ICD-10-CM | POA: Diagnosis not present

## 2021-10-12 LAB — BASIC METABOLIC PANEL
Anion gap: 3 — ABNORMAL LOW (ref 5–15)
BUN: 22 mg/dL (ref 8–23)
CO2: 42 mmol/L — ABNORMAL HIGH (ref 22–32)
Calcium: 8.5 mg/dL — ABNORMAL LOW (ref 8.9–10.3)
Chloride: 95 mmol/L — ABNORMAL LOW (ref 98–111)
Creatinine, Ser: 0.72 mg/dL (ref 0.61–1.24)
GFR, Estimated: >60 mL/min (ref >60)
Glucose, Bld: 122 mg/dL — ABNORMAL HIGH (ref 70–99)
Potassium: 4.7 mmol/L (ref 3.5–5.1)
Sodium: 140 mmol/L (ref 135–145)

## 2021-10-12 LAB — GLUCOSE, CAPILLARY
Glucose-Capillary: 106 mg/dL — ABNORMAL HIGH (ref 70–99)
Glucose-Capillary: 182 mg/dL — ABNORMAL HIGH (ref 70–99)
Glucose-Capillary: 203 mg/dL — ABNORMAL HIGH (ref 70–99)
Glucose-Capillary: 252 mg/dL — ABNORMAL HIGH (ref 70–99)

## 2021-10-12 LAB — MAGNESIUM: Magnesium: 2.1 mg/dL (ref 1.7–2.4)

## 2021-10-12 MED ORDER — ORAL CARE MOUTH RINSE: 15.0000 mL | OROMUCOSAL | Status: DC | PRN

## 2021-10-12 MED ORDER — ORAL CARE MOUTH RINSE
15.0000 mL | OROMUCOSAL | Status: DC
  Administered 2021-10-13 – 2021-10-30 (×58): 15 mL via OROMUCOSAL

## 2021-10-12 NOTE — TOC Progression Note (Signed)
Transition of Care (TOC) - Progression Note    Patient Details  Name: [name] MRN: [id number] Date of Birth: 05-10-1955  Transition of Care [facility]) CM/SW Contact  Laurena Slimmer, RN Phone Number: 10/12/2021, 3:01 PM  Clinical Narrative:    10:57 Spoke with [facility] liaison about wife's concerns. Bari bed is on hand. BIPAP would still need to be ordered for  patient arrival if bed accepted.   11:40pm  List of facilities for patient referral for SNF provided to patient's wife. Spoke with patient wife at bedside. Patient wife state the bed offer presented was longs distance for her. Wife advised no other offers hd been given. Wife also advised aout insurance auth that would ne needed. Wife encouraged to call faciity to discuss concerns about care and cleanliness.    Expected Discharge Plan: [place] Barriers to Discharge: Continued Medical Work up  Expected Discharge Plan and Services Expected Discharge Plan: [place] In-house Referral: NA   Post Acute Care Choice: Resumption of Svcs/PTA Provider Living arrangements for the past 2 months: Single Family Home                                       Social Determinants of Health (SDOH) Interventions    Readmission Risk Interventions    09/29/2021   11:12 AM  Readmission Risk Prevention Plan  Transportation Screening Complete  PCP or Specialist Appt within 3-5 Days Complete  HRI or [place] Complete  Social Work Consult for [place] Planning/Counseling Complete  Palliative Care Screening Not Applicable  Medication Review Press photographer) Complete

## 2021-10-12 NOTE — Treatment Plan (Signed)
Diagnosis: MRSA bacteremia with left foot osteomyelitis s/p BKA left Baseline Creatinine 0.72   Allergies  Allergen Reactions   Penicillins Other (See Comments)    Happened at 66 years old and pt. stated he passed out  He has tolerated amoxicillin/clavulanate and ampicillin/sulbactam   Statins     Other reaction(s): Muscle Pain Causes legs to ache per pt    OPAT Orders Discharge antibiotics: Daptomycin 829m IVPB  every 24 hours  for 4 weeks End Date: 11/02/21   [facility]  Labs weekly while on IV antibiotics: _X_ CBC with differential  _X_ CMP X__ CRP _X_ ESR _X_ CK  _X_ Please pull PIC at completion of IV antibiotics   Fax weekly labs to [phone] Clinic Follow Up Appt:Dre Gamino 11/02/21 at 11.45 Am   Call [phone] any questions

## 2021-10-12 NOTE — Progress Notes (Signed)
Physical Therapy Treatment Patient Details Name: [name]. MRN: [id number] DOB: [birth year] Today's Date: 10/12/2021   History of Present Illness [name] is a 10yoM who comes to [facility] on 09/28/21 c progression of Left chornic heel ulcer. MRI showed significant progressive osteomyelitis in the left calcaneus. PMH HTN, IDDM2, morbid obesity, depression, CAD s/p CABG 2006 and PCI to RCA in 12/2020, chronic B venous stasis on Unna boots, chronic L foot ulcer, COPD, OSA on CPAP nightly, ai-fib, PAD, s/p multiple toe amputations.  S/p L BKA on 10/03/21.    PT Comments    Pt was long sitting in bed c/o butt pain. He requested to get OOB to recliner. Supportive spouse at bedside and had a lot of questions throughout session. Pt continues to require max assist to perform bed mobility. +2 assistance for safety throughout. Sat EOB with assistance prior to hoyer lift transfer from bed> recliner. Pt was issued HEP handout and he performed. Pt is limited by fatigue but overall does demonstrate improving strength. Highly recommend rehab at DC to address deficits while maximizing independence with ADLs.    Recommendations for follow up therapy are one component of a multi-disciplinary discharge planning process, led by the attending physician.  Recommendations may be updated based on patient status, additional functional criteria and insurance authorization.  Follow Up Recommendations  Skilled nursing-short term rehab (<3 hours/day)     Assistance Recommended at Discharge Frequent or constant Supervision/Assistance  Patient can return home with the following Two people to help with bathing/dressing/bathroom;Two people to help with walking and/or transfers;Assistance with cooking/housework;Assist for transportation;Help with stairs or ramp for entrance;Direct supervision/assist for medications management;Direct supervision/assist for financial management   Equipment Recommendations  Other (comment)  (defer to next level of care)       Precautions / Restrictions Precautions Precautions: Fall Precaution Comments: Contact precautions Restrictions Weight Bearing Restrictions: Yes LLE Weight Bearing: Non weight bearing     Mobility  Bed Mobility Overal bed mobility: Needs Assistance Bed Mobility: Sit to Supine Rolling: Max assist   Supine to sit: Max assist Sit to supine: Max assist   General bed mobility comments: pt was able to achieve EOB short sit with max assist + increased time. Poor sitting balance with posterior lean. Pt requested to sit in recliner again this date.    Transfers Overall transfer level: Needs assistance Equipment used:  (hoyer lift) Transfers: Bed to chair/wheelchair/BSC    General transfer comment: pt requires use of hoyer lift to get OOB and return to bed. unsafe to stand due to wekness. Transfer via Geophysicist/field seismologist:  Product manager)  Ambulation/Gait  General Gait Details: unable    Balance Overall balance assessment: Needs assistance Sitting-balance support: Feet supported, Bilateral upper extremity supported Sitting balance-Leahy Scale: Poor    Standing balance comment: unable to stand       Cognition Arousal/Alertness: Awake/alert Behavior During Therapy: WFL for tasks assessed/performed Overall Cognitive Status: Impaired/Different from baseline Area of Impairment: Attention, Orientation      Orientation Level: Time, Disoriented to Current Attention Level: Focused       Exercises Amputee Exercises Quad Sets: AROM, 10 reps Gluteal Sets: 10 reps Hip Extension: Left, 10 reps, Seated, Limitations Knee Extension: 10 reps Straight Leg Raises: 10 reps    General Comments General comments (skin integrity, edema, etc.): pt was able to sit EOB x several minutes when returning to bed. Spouse educated on importance of ther ex, stretching, and discussed rehab recommendation at DC.  Pertinent Vitals/Pain Pain Assessment Pain Assessment:  0-10 Pain Score: 6  Faces Pain Scale: Hurts little more Pain Location: generalized Pain Descriptors / Indicators: Discomfort Pain Intervention(s): Limited activity within patient's tolerance, Monitored during session, Premedicated before session, Repositioned     PT Goals (current goals can now be found in the care plan section) Acute Rehab PT Goals Patient Stated Goal: none stated Progress towards PT goals: Progressing toward goals    Frequency    Min 2X/week      PT Plan Current plan remains appropriate       AM-PAC PT "6 Clicks" Mobility   Outcome Measure  Help needed turning from your back to your side while in a flat bed without using bedrails?: A Lot Help needed moving from lying on your back to sitting on the side of a flat bed without using bedrails?: A Lot Help needed moving to and from a bed to a chair (including a wheelchair)?: Total Help needed standing up from a chair using your arms (e.g., wheelchair or bedside chair)?: Total Help needed to walk in hospital room?: Total Help needed climbing 3-5 steps with a railing? : Total 6 Click Score: 8    End of Session Equipment Utilized During Treatment: Oxygen (2 L throughout) Activity Tolerance: Patient limited by fatigue Patient left:  (pt was in recliner post session.) Nurse Communication: Mobility status PT Visit Diagnosis: Difficulty in walking, not elsewhere classified (R26.2);Other abnormalities of gait and mobility (R26.89)     Time: 4514-6047 PT Time Calculation (min) (ACUTE ONLY): 17 min  Charges:  $Therapeutic Activity: 8-22 mins                     Julaine Fusi PTA 10/12/21, 1:15 PM

## 2021-10-12 NOTE — Progress Notes (Signed)
Progress Note   Patient: [name]. RFF:[id number] DOB: 1955-12-01 DOA: 09/28/2021     14 DOS: the patient was seen and examined on 10/12/2021   Brief hospital course: Alec Emberton. is a 66 y.o. male with medical history significant for chronic diastolic dysfunction CHF with LVEF of 50 to 55%, hypertension, coronary artery disease, depression, COPD, A-fib, morbid obesity (BMI 49), obstructive sleep apnea, status post recent wound debridement and partial calcanectomy (05/16) currently on oral antibiotic therapy after completion of IV who presents to the ER for evaluation of bilateral lower extremity swelling as well as increased redness and swelling involving the left lower extremity mostly over the heel. MRI of the foot showed left foot osteomyelitis, placed on vancomycin and Zosyn. Blood culture positive for MRSA secondary to osteomyelitis.   Left BKA performed on 6/6.  Patient also has evidence of acute on chronic diastolic congestive heart failure.  Started IV Lasix.  Currently patient on daptomycin as per ID to treat MRSA sepsis and will need to be on for a total of 4 weeks.  Repeat blood cultures negative.  Acute delirium during the entire hospitalization.  Mental status seems to wax and wane.  Answering questions appropriately.  Continue Seroquel at night and BiPAP at night.  Foley catheter placed on 6/11 secondary to urinary retention.  Flomax increased to twice a day dosing and added Proscar.  Recommend a week of Foley catheter and then voiding trial after that.  Since Cardizem CD can cause urinary retention and decrease the dose down from 360 to 240.  Increased metoprolol 250 mg twice a day.   Continue amiodarone and Eliquis.  Assessment and Plan: * Sepsis due to methicillin resistant Staphylococcus aureus (MRSA) ([place]) MRSA growing out of blood cultures on 09/28/2021.  Repeat blood cultures negative.  Currently on daptomycin.  Source of infection likely osteomyelitis.   Will need 4 weeks total of antibiotics total.  Acute delirium Mental status has waxed and waned through the hospital course.  Patient answers questions appropriately.  Not complaining of any pain and has not been taking pain medications.  Per Dr. Leslye Peer, patient's wife hesitant about stopping Mirapex and gabapentin at this point.  Stressed to nursing staff of the importance of BiPAP to prevent CO2 retention.  Continue decreased dose of Seroquel down to 25 mg at night.  CT scan of the head negative on 10/06/2021. Delirium precautions  Osteomyelitis ([place]) Status post left BKA on 10/03/2021. Patient on IV daptomycin. Antibiotics per ID    Acute urinary retention Foley catheter placed on 10/08/2021.  Continue Foley catheter for a week and then can do a voiding trial.  Increased Flomax to twice daily dosing and continue Proscar.  Acute on chronic diastolic CHF (congestive heart failure) ([place]) Patient's last known LVEF is 55 to 60% from a 2D echocardiogram which was done 01/23.  Net IO Since Admission: -19,704.44 mL [10/12/21 1732] Output yesterday: 3675 cc --Continue Lasix IV 40 mg twice daily.  --Continue metoprolol. --Wean O2 as able --I/O's, daily weights --Cardiology following   Uncontrolled type 2 diabetes mellitus with hyperglycemia, with long-term current use of insulin (HCC) Uncontrolled.  Last hemoglobin A1c in the chart of 10.2.  Continue Semglee insulin to 21 units twice a day.  Continue short acting insulin prior to meals plus sliding scale.  Acute on chronic respiratory failure with hypoxia and hypercapnia (HCC) Requires BiPAP at night in order to to prevent CO2 narcosis and encephalopathy.   Last PCO2 64  --  qualifies for NIV at rehab.  AF (paroxysmal atrial fibrillation) (HCC) Continue amiodarone.  Eliquis for anticoagulation.   Due to urinary retention, decreased Cardizem CD down to 240 mg daily, increased metoprolol 150 mg twice daily.    6/15: remains in A-flutter with  HR's in 100's  Obesity, Class III, BMI 40-49.9 (morbid obesity) (HCC) BMI 92.92 Complicates overall care and prognosis.  Recommend lifestyle modifications including physical activity and diet for weight loss and overall long-term health.   Essential hypertension, benign Continue metoprolol, Cardizem and Lasix  RLS (restless legs syndrome) Continue pramipexole and gabapentin  Anemia Hemoglobin up to 9.3 today.  Continue to monitor closely being on blood thinners.    OSA (obstructive sleep apnea) Previously using CPAP at night. Transition to BiPAP secondary to CO2 retention.  Qualifies for NIV with elevated PCO2 on ABG.  Acute respiratory failure with hypoxia and hypercapnia (HCC) See above  Acute metabolic encephalopathy See acute delirium  Generalized weakness PT and OT recommending SNF. Fall precautions Continue PT OT here Out of bed to chair, mobilize as tolerated  Malnutrition of moderate degree Related to acute illness (left heel osteomyelitis) and energy intake of Wood than 75% for more than 7 days, mild fat depletion. -- Appreciate recommendations from dietitian -- Started on Glucerna and ensures, vitamins        Subjective: Patient laying flat in recliner, wife at bedside when seen on rounds.  He reports his back side still hurts.  Wife expressing concerns about not enough rehab / therapy and mobilizing, progressive weakness.  Pt otherwise offers no acute complaints.   Physical Exam: Vitals:   10/12/21 0437 10/12/21 0826 10/12/21 1141 10/12/21 1143  BP: 108/79 113/77 98/67 103/69  Pulse: (!) 106 (!) 108 (!) 108   Resp: '18 18 20   '$ Temp: 97.6 F (36.4 C) 98 F (36.7 C) 98.2 F (36.8 C)   TempSrc: Oral  Oral   SpO2: 98% 97% 98%   Weight: (!) 166.4 kg     Height:       General exam: awake laying in recliner, alert, no acute distress, morbidly obese HEENT: moist mucus membranes, hearing grossly normal  Respiratory system: on [place] oxygen, normal respiratory  effort. Cardiovascular system: tachycardic, unable to visualize JVD   Gastrointestinal system: soft, non-distended Central nervous system: Alert, oriented to self and place no gross focal neurologic deficits, normal speech Extremities: Left BKA, right lower extremity with clean dry intact gauze dressing with severe venous stasis changes approximately, RLE edema proximal to dressing is improved Psychiatry: normal mood, congruent affect   Data Reviewed:  Notable labs: Cl 95, CO2 42, glucose 122, Ca 8.5, gap 3.  CBG's 106, 182, 203   Family Communication: None at bedside, will attempt to call  Disposition: Status is: Inpatient Remains inpatient appropriate because: Remains on IV antibiotics, IV diuretics and requires SNF placement for rehab   Planned Discharge Destination: Skilled nursing facility    Time spent: 40 minutes  Author: Ezekiel Slocumb, DO 10/12/2021 5:38 PM  For on call review [email or web address].

## 2021-10-12 NOTE — Progress Notes (Addendum)
[facility] Cardiology  Patient ID: [name] MRN: [id number] DOB/AGE: 01-Nov-1955 66 y.o.   Admit date: 09/28/2021 Referring Physician Dr. Francine Graven Primary Physician Dr. Felipa Furnace Primary Cardiologist Dr. Donnelly Angelica Reason for Consultation acute on chronic HFpEF    HPI: Kasim "[name]" Tahmir Kleckner. is a [id number] with a PMH significant for CAD s/p CABG 2006 and PCI of RCA 01/2020, HFpEF (LVEF 50-55% 04/2021), A. fib / flutter on apixaban, amiodarone and digoxin, HTN, BPH, morbid obesity, poorly controlled type 2 diabetes,  PAD s/p left third toe amputation, chronic venous stasis, OSA on CPAP who presented to [facility] ED on 09/28/21 with increasing pain and swelling in his left leg, and imaging concerning for worsening osteomyelitis of his left foot. He has has onoging infections needing wound debridement in his left lower leg over the past several months. Cardiology is consulted for assistance with his heart failure.   Interval History:  -no acute events, diuresing well (net negative 16L so far), current weight 166 kg -seen laying flat in recliner with wife at bedside. -able to lift both of his legs. No shortness of breath, chest pain, palpitations   Vitals:   10/11/21 2200 10/12/21 0019 10/12/21 0437 10/12/21 0826  BP:  107/79 108/79 113/77  Pulse:  (!) 108 (!) 106 (!) 108  Resp:  '18 18 18  '$ Temp:  98.7 F (37.1 C) 97.6 F (36.4 C) 98 F (36.7 C)  TempSrc:   Oral   SpO2: 97% 98% 98% 97%  Weight:   (!) 166.4 kg   Height:         Intake/Output Summary (Last 24 hours) at 10/12/2021 1011 Last data filed at 10/12/2021 0400 Gross per 24 hour  Intake 480 ml  Output 3275 ml  Net -2795 ml    PHYSICAL EXAM General: Ill-appearing Caucasian male,  in no acute distress.  Laying flat in recliner with wife at bedside  HEENT:  Normocephalic and atraumatic. Neck:   No JVD.  Lungs: Normal respiratory effort on oxygen by [place]. Clear bilaterally to auscultation anteriorly. No wheezes, crackles,  rhonchi.  Heart: Tachy but regular. Normal S1 and S2 without gallops or murmurs.  Abdomen: Obese appearing.  Msk: Normal strength and tone for age. Extremities:  RLE with venous stasis changes and woody appearance to skin,  Left leg BKA with dry padded dressing.  Both legs are much softer and less edematous than on presentation Neuro: Alert and oriented x3 Psych: answers questions appropriately    LABS: Basic Metabolic Panel: Recent Labs    10/11/21 0637 10/12/21 0629  NA 137 140  K 5.2* 4.7  CL 95* 95*  CO2 35* 42*  GLUCOSE 94 122*  BUN 21 22  CREATININE 0.63 0.72  CALCIUM 8.6* 8.5*  MG  --  2.1    Liver Function Tests: No results for input(s): "AST", "ALT", "ALKPHOS", "BILITOT", "PROT", "ALBUMIN" in the last 72 hours.  No results for input(s): "LIPASE", "AMYLASE" in the last 72 hours. CBC: Recent Labs    10/10/21 0436 10/11/21 0637  WBC 7.1 10.1  HGB 8.9* 9.3*  HCT 31.5* 32.1*  MCV 94.9 96.7  PLT 315 342    Cardiac Enzymes: No results for input(s): "CKTOTAL", "CKMB", "CKMBINDEX", "TROPONINI" in the last 72 hours.  BNP: Invalid input(s): "POCBNP" D-Dimer: No results for input(s): "DDIMER" in the last 72 hours. Hemoglobin A1C: No results for input(s): "HGBA1C" in the last 72 hours. Fasting Lipid Panel: No results for input(s): "CHOL", "HDL", "LDLCALC", "TRIG", "CHOLHDL", "LDLDIRECT"  in the last 72 hours. Thyroid Function Tests: No results for input(s): "TSH", "T4TOTAL", "T3FREE", "THYROIDAB" in the last 72 hours.  Invalid input(s): "FREET3" Anemia Panel: No results for input(s): "VITAMINB12", "FOLATE", "FERRITIN", "TIBC", "IRON", "RETICCTPCT" in the last 72 hours.  No results found.   Echo LVEF 50-55% 05/10/2021  TELEMETRY: Atrial fibrillation 111 bpm:  ASSESSMENT AND PLAN:  Principal Problem:   Sepsis due to methicillin resistant Staphylococcus aureus (MRSA) ([place]) Active Problems:   Essential hypertension, benign   RLS (restless legs  syndrome)   Osteomyelitis (HCC)   Obesity, Class III, BMI 40-49.9 (morbid obesity) (HCC)   Acute on chronic diastolic CHF (congestive heart failure) (HCC)   AF (paroxysmal atrial fibrillation) ([place])   Uncontrolled type 2 diabetes mellitus with hyperglycemia, with long-term current use of insulin (HCC)   Malnutrition of moderate degree   Generalized weakness   Acute metabolic encephalopathy   Acute respiratory failure with hypoxia and hypercapnia (HCC)   OSA (obstructive sleep apnea)   Acute delirium   Acute on chronic respiratory failure with hypoxia and hypercapnia (HCC)   Anemia   Acute urinary retention    1. Acute on chronic diastolic congestive heart failure, HFpEF, LVEF 50-55% 05/10/2021, good diuresis after IV furosemide but remains on supplemental O2 2.  Paroxysmal atrial fibrillation/atrial flutter, currently in atrial flutter heart rate 109 bpm, on amiodarone and metoprolol for rate control and Eliquis for stroke prevention, cardizem '360mg'$  CD 3.  Coronary artery disease, status post CABG 2006, PCI RCA 01/2020, currently without chest pain 4.  Left foot osteomyelitis on IV vancomycin, left BKA 6/6 with Dr. Delana Meyer 5.  Acute kidney injury, improving to Cr 1.03 and >60  6.  MRSA bacteremia, osteomyelitis of L foot likely source, on IV daptomycin, repeat echo ordered by ID, resulted with thickened aortic and mitral leaflets without obvious vegetation. Will need 4 weeks of Abx, per ID 7. Urinary retention, improved after increasing flomax to BID and adding proscar, currently with Foley catheter, planning voiding trial before discharge   Recommendations   1.  Agree with current therapy 2.  Continue IV lasix '40mg'$  BID, BMP daily - renal function stable  - likely discharge on lasix '60mg'$  PO daily  3.  Eliquis '5mg'$  BID restarted 6/9 for stroke risk reduction  4.  Continue amiodarone, metoprolol tartrate, and cardizem for rate and rhythm control 5.  S/p Left BKA 6/6 6.  Pending rehab bed  placement   This patient's plan of care was discussed and created with Dr. Clayborn Bigness and he is in agreement.    Tristan Schroeder, PA-C 10/12/2021 10:11 AM

## 2021-10-13 DIAGNOSIS — I5033 Acute on chronic diastolic (congestive) heart failure: Secondary | ICD-10-CM | POA: Diagnosis not present

## 2021-10-13 DIAGNOSIS — R6520 Severe sepsis without septic shock: Secondary | ICD-10-CM | POA: Diagnosis not present

## 2021-10-13 DIAGNOSIS — J9621 Acute and chronic respiratory failure with hypoxia: Secondary | ICD-10-CM | POA: Diagnosis not present

## 2021-10-13 DIAGNOSIS — A4102 Sepsis due to Methicillin resistant Staphylococcus aureus: Secondary | ICD-10-CM | POA: Diagnosis not present

## 2021-10-13 LAB — BASIC METABOLIC PANEL
Anion gap: 6 (ref 5–15)
BUN: 26 mg/dL — ABNORMAL HIGH (ref 8–23)
CO2: 40 mmol/L — ABNORMAL HIGH (ref 22–32)
Calcium: 8.6 mg/dL — ABNORMAL LOW (ref 8.9–10.3)
Chloride: 94 mmol/L — ABNORMAL LOW (ref 98–111)
Creatinine, Ser: 0.79 mg/dL (ref 0.61–1.24)
GFR, Estimated: >60 mL/min (ref >60)
Glucose, Bld: 154 mg/dL — ABNORMAL HIGH (ref 70–99)
Potassium: 4.6 mmol/L (ref 3.5–5.1)
Sodium: 140 mmol/L (ref 135–145)

## 2021-10-13 LAB — GLUCOSE, CAPILLARY
Glucose-Capillary: 148 mg/dL — ABNORMAL HIGH (ref 70–99)
Glucose-Capillary: 167 mg/dL — ABNORMAL HIGH (ref 70–99)
Glucose-Capillary: 241 mg/dL — ABNORMAL HIGH (ref 70–99)
Glucose-Capillary: 233 mg/dL — ABNORMAL HIGH (ref 70–99)

## 2021-10-13 MED ORDER — METOPROLOL TARTRATE 50 MG PO TABS
100.0000 mg | ORAL_TABLET | Freq: Two times a day (BID) | ORAL | Status: DC
  Administered 2021-10-13 – 2021-10-24 (×22): 100 mg via ORAL
  Filled 2021-10-13 (×23): qty 2

## 2021-10-13 MED ORDER — PNEUMOCOCCAL 20-VAL CONJ VACC 0.5 ML IM SUSY
0.5000 mL | PREFILLED_SYRINGE | INTRAMUSCULAR | Status: AC
  Administered 2021-10-14: 0.5 mL via INTRAMUSCULAR
  Filled 2021-10-13 (×2): qty 0.5

## 2021-10-13 MED ORDER — TORSEMIDE 20 MG PO TABS
20.0000 mg | ORAL_TABLET | Freq: Two times a day (BID) | ORAL | Status: DC
  Administered 2021-10-14 – 2021-10-15 (×3): 20 mg via ORAL
  Filled 2021-10-13 (×3): qty 1

## 2021-10-13 NOTE — Progress Notes (Signed)
Physical Therapy Treatment Patient Details Name: [name]. MRN: [id number] DOB: May 29, 1955 Today's Date: 10/13/2021   History of Present Illness [name] is a 70yoM who comes to [facility] on 09/28/21 c progression of Left chornic heel ulcer. MRI showed significant progressive osteomyelitis in the left calcaneus. PMH HTN, IDDM2, morbid obesity, depression, CAD s/p CABG 2006 and PCI to RCA in 12/2020, chronic B venous stasis on Unna boots, chronic L foot ulcer, COPD, OSA on CPAP nightly, ai-fib, PAD, s/p multiple toe amputations.  S/p L BKA on 10/03/21.    PT Comments    Pt was lethargic in bed with pt long sitting in bed (HOB elevated ~ 20 degrees) he had removed O2 with sao2 reading in 70s. Poor reading on monitor. Moved sensor to earlobe with improved accuracy. Pt was able to recover to > 88% with 3 L o2. Anytime HOB is lowered pt desaturates rapidly. He is overall more lethargic today. Session was greatly limited by BM. He did progress to EOB sitting however once BM found, was returned to bed and hygiene care performed with RN staff. Pt will need extensive PT going forward to maximize independence with all ADLs.   Recommendations for follow up therapy are one component of a multi-disciplinary discharge planning process, led by the attending physician.  Recommendations may be updated based on patient status, additional functional criteria and insurance authorization.  Follow Up Recommendations  Skilled nursing-short term rehab (<3 hours/day)     Assistance Recommended at Discharge Frequent or constant Supervision/Assistance  Patient can return home with the following Two people to help with bathing/dressing/bathroom;Two people to help with walking and/or transfers;Assistance with cooking/housework;Assist for transportation;Help with stairs or ramp for entrance;Direct supervision/assist for medications management;Direct supervision/assist for financial management   Equipment  Recommendations  Other (comment) (defer to next level of care)       Precautions / Restrictions Precautions Precautions: Fall Precaution Comments: Contact precautions Restrictions Weight Bearing Restrictions: Yes LLE Weight Bearing: Non weight bearing     Mobility  Bed Mobility Overal bed mobility: Needs Assistance Bed Mobility: Rolling Rolling: Max assist   Supine to sit: Max assist Sit to supine: Max assist   General bed mobility comments: Max assist to achieve EOB sitting however pt soiled from BMs. Returned to bed and Curator with hygiene care    Transfers    General transfer comment: unable today due to lethargy and multiple BMs      Balance Overall balance assessment: Needs assistance Sitting-balance support: Feet supported, Bilateral upper extremity supported Sitting balance-Leahy Scale: Poor       Cognition Arousal/Alertness: Awake/alert Behavior During Therapy: WFL for tasks assessed/performed Overall Cognitive Status: Impaired/Different from baseline Area of Impairment: Attention, Orientation      Orientation Level: Time, Disoriented to    General Comments: Pt is much more lethargic today. struggles to stay awake but was ble to follow one step commands. session greatly limited by BMs x 2           General Comments General comments (skin integrity, edema, etc.): reviewed HEP exercises however pt struggles to stay awake for performance      Pertinent Vitals/Pain Pain Assessment Pain Assessment: 0-10 Pain Score: 4  Faces Pain Scale: Hurts a little bit Pain Location: generalized Pain Descriptors / Indicators: Discomfort Pain Intervention(s): Limited activity within patient's tolerance, Monitored during session, Premedicated before session, Repositioned     PT Goals (current goals can now be found in the care plan section)  Acute Rehab PT Goals Patient Stated Goal: none stated Progress towards PT goals: Progressing toward  goals    Frequency    Min 2X/week      PT Plan Current plan remains appropriate       AM-PAC PT "6 Clicks" Mobility   Outcome Measure  Help needed turning from your back to your side while in a flat bed without using bedrails?: A Lot Help needed moving from lying on your back to sitting on the side of a flat bed without using bedrails?: A Lot Help needed moving to and from a bed to a chair (including a wheelchair)?: Total Help needed standing up from a chair using your arms (e.g., wheelchair or bedside chair)?: Total Help needed to walk in hospital room?: Total Help needed climbing 3-5 steps with a railing? : Total 6 Click Score: 8    End of Session Equipment Utilized During Treatment: Oxygen (pt had removed O2 upon arriving with sao2 in 70s. Author questions pleth reliability. Sensor moved to ear lobe with improved readings and O2 replaced.) Activity Tolerance: Patient limited by fatigue Patient left: in bed;with call bell/phone within reach;with bed alarm set;with family/visitor present;with nursing/sitter in room Nurse Communication: Mobility status PT Visit Diagnosis: Difficulty in walking, not elsewhere classified (R26.2);Other abnormalities of gait and mobility (R26.89)     Time: 8921-1941 PT Time Calculation (min) (ACUTE ONLY): 13 min  Charges:  $Therapeutic Activity: 8-22 mins          Julaine Fusi PTA 10/13/21, 12:55 PM

## 2021-10-13 NOTE — Progress Notes (Signed)
PHARMACY CONSULT NOTE FOR:  OUTPATIENT  PARENTERAL ANTIBIOTIC THERAPY (OPAT)  Indication: MRSA bacteremia and osteomyelitis Regimen: Daptomycin '800mg'$  IV q24h End date: 11/02/2021  Please pull PIC at completion of IV antibiotics Fax weekly labs to [phone]  IV antibiotic discharge orders are pended. To discharging provider:  please sign these orders via discharge navigator,  Select New Orders & click on the button choice - Manage This Unsigned Work.     Thank you for allowing pharmacy to be a part of this patient's care.  Doreene Eland, PharmD, BCPS, BCIDP Work Cell: [phone] 10/13/2021 3:58 PM
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
## 2021-10-13 NOTE — Progress Notes (Addendum)
Foley removed as ordered. Per wife request, foley was removed after patient ate his dinner. Male purewick in place. Repositioned and denies any needs at this time. Safety measures implemented.

## 2021-10-13 NOTE — TOC Progression Note (Signed)
Transition of Care (TOC) - Progression Note    Patient Details  Name: [name]. MRN: [id number] Date of Birth: [birth year]  Transition of Care [facility]) CM/SW Contact  Anselm Pancoast, RN Phone Number: 10/13/2021, 4:57 PM  Clinical Narrative:    Spoke with spouse regarding concerns related to SNF placement. Discussed barriers/difficulties with finding accepting facility due to behavioral issues. Wife reports reviewing the Medicare.gov website and being very concerned regarding the quality stars and safety reviews. Spouse reports patient has spent 2 episodes in [facility] and she would prefer that if possible. Wife also reports her health does not allow for her to drive that distance daily to [place]. Discussed possibility once patient is medically cleared that [place] may be the only option however TOC would reach out to [facility] again since patients behaviors are improving and confusion is improving. Spouse reports she understands her appeal rights and will use if needed. Confirmed that was her option and TOC would be happy to assist her with that if needed.    Expected Discharge Plan: [place] Barriers to Discharge: Continued Medical Work up  Expected Discharge Plan and Services Expected Discharge Plan: [place] In-house Referral: NA   Post Acute Care Choice: Resumption of Svcs/PTA Provider Living arrangements for the past 2 months: Single Family Home                                       Social Determinants of Health (SDOH) Interventions    Readmission Risk Interventions    09/29/2021   11:12 AM  Readmission Risk Prevention Plan  Transportation Screening Complete  PCP or Specialist Appt within 3-5 Days Complete  HRI or [place] Complete  Social Work Consult for [place] Planning/Counseling Complete  Palliative Care Screening Not Applicable  Medication Review Press photographer) Complete

## 2021-10-13 NOTE — TOC Progression Note (Signed)
Transition of Care (TOC) - Progression Note    Patient Details  Name: [name]. MRN: [id number] Date of Birth: 16-Aug-1955  Transition of Care [facility]) CM/SW Contact  Laurena Slimmer, RN Phone Number: 10/13/2021, 12:25 PM  Clinical Narrative:    12:09pm Attempt to reach patient's wife. No answer. Left a vm regarding discharge plan.   12:17pm Retrieved Patient's wife's, [name]'s call. Patient would like to be referred to as [name], not [facility] as it is listed in demographics. [name] stated she had not spoken with Ebony Hail the liaison for [facility]. [name] is not agreeable to bed offer at [facility] and is requesting to speak with [facility] supervisor face to face today.    Expected Discharge Plan: [place] Barriers to Discharge: Continued Medical Work up  Expected Discharge Plan and Services Expected Discharge Plan: [place] In-house Referral: NA   Post Acute Care Choice: Resumption of Svcs/PTA Provider Living arrangements for the past 2 months: Single Family Home                                       Social Determinants of Health (SDOH) Interventions    Readmission Risk Interventions    09/29/2021   11:12 AM  Readmission Risk Prevention Plan  Transportation Screening Complete  PCP or Specialist Appt within 3-5 Days Complete  HRI or [place] Complete  Social Work Consult for [place] Planning/Counseling Complete  Palliative Care Screening Not Applicable  Medication Review Press photographer) Complete

## 2021-10-14 ENCOUNTER — Inpatient Hospital Stay: Payer: Medicare Other

## 2021-10-14 DIAGNOSIS — R6520 Severe sepsis without septic shock: Secondary | ICD-10-CM | POA: Diagnosis not present

## 2021-10-14 DIAGNOSIS — A4102 Sepsis due to Methicillin resistant Staphylococcus aureus: Secondary | ICD-10-CM | POA: Diagnosis not present

## 2021-10-14 DIAGNOSIS — J9621 Acute and chronic respiratory failure with hypoxia: Secondary | ICD-10-CM | POA: Diagnosis not present

## 2021-10-14 DIAGNOSIS — E875 Hyperkalemia: Secondary | ICD-10-CM | POA: Diagnosis not present

## 2021-10-14 DIAGNOSIS — R338 Other retention of urine: Secondary | ICD-10-CM | POA: Diagnosis not present

## 2021-10-14 LAB — GLUCOSE, CAPILLARY
Glucose-Capillary: 133 mg/dL — ABNORMAL HIGH (ref 70–99)
Glucose-Capillary: 289 mg/dL — ABNORMAL HIGH (ref 70–99)
Glucose-Capillary: 248 mg/dL — ABNORMAL HIGH (ref 70–99)
Glucose-Capillary: 167 mg/dL — ABNORMAL HIGH (ref 70–99)

## 2021-10-14 LAB — BASIC METABOLIC PANEL
Anion gap: 5 (ref 5–15)
BUN: 27 mg/dL — ABNORMAL HIGH (ref 8–23)
CO2: 41 mmol/L — ABNORMAL HIGH (ref 22–32)
Calcium: 9.3 mg/dL (ref 8.9–10.3)
Chloride: 93 mmol/L — ABNORMAL LOW (ref 98–111)
Creatinine, Ser: 0.76 mg/dL (ref 0.61–1.24)
GFR, Estimated: >60 mL/min (ref >60)
Glucose, Bld: 145 mg/dL — ABNORMAL HIGH (ref 70–99)
Potassium: 5.3 mmol/L — ABNORMAL HIGH (ref 3.5–5.1)
Sodium: 139 mmol/L (ref 135–145)

## 2021-10-14 MED ORDER — SODIUM ZIRCONIUM CYCLOSILICATE 5 G PO PACK
5.0000 g | PACK | Freq: Once | ORAL | Status: AC
Start: 2021-10-14 — End: 2021-10-14
  Administered 2021-10-14: 5 g via ORAL
  Filled 2021-10-14: qty 1

## 2021-10-14 NOTE — Assessment & Plan Note (Signed)
Resolved with Lokelma on 6/17, 6/19. Monitor BMP.

## 2021-10-14 NOTE — Progress Notes (Signed)
Patient tolerating BiPap overnight. Able to wear it through the entire night. Pt with good urine output, bladder scan reminded <150cc overnight.

## 2021-10-14 NOTE — Progress Notes (Addendum)
Progress Note   Patient: [name]. VOH:[id number] DOB: 23-Jun-1955 DOA: 09/28/2021     16 DOS: the patient was seen and examined on 10/14/2021   Brief hospital course: [name]. is a 66 y.o. male with medical history significant for chronic diastolic dysfunction CHF with LVEF of 50 to 55%, hypertension, coronary artery disease, depression, COPD, A-fib, morbid obesity (BMI 49), obstructive sleep apnea, status post recent wound debridement and partial calcanectomy (05/16) currently on oral antibiotic therapy after completion of IV who presents to the ER for evaluation of bilateral lower extremity swelling as well as increased redness and swelling involving the left lower extremity mostly over the heel. MRI of the foot showed left foot osteomyelitis, placed on vancomycin and Zosyn. Blood culture positive for MRSA secondary to osteomyelitis.   Left BKA performed on 6/6.  Patient also has evidence of acute on chronic diastolic congestive heart failure.  Started IV Lasix.  Currently patient on daptomycin as per ID to treat MRSA sepsis and will need to be on for a total of 4 weeks.  Repeat blood cultures negative.  Acute delirium during the entire hospitalization.  Mental status seems to wax and wane.  Answering questions appropriately.  Continue Seroquel at night and BiPAP at night.  Foley catheter placed on 6/11 secondary to urinary retention.  Flomax increased to twice a day dosing and added Proscar.  Recommend a week of Foley catheter and then voiding trial after that.  Since Cardizem CD can cause urinary retention and decrease the dose down from 360 to 240.  Increased metoprolol 250 mg twice a day.   Continue amiodarone and Eliquis.  Assessment and Plan: * Sepsis due to methicillin resistant Staphylococcus aureus (MRSA) ([place]) MRSA growing out of blood cultures on 09/28/2021.  Repeat blood cultures negative.  Currently on daptomycin.  Source of infection likely osteomyelitis.   Will need 4 weeks total of antibiotics total.  Acute delirium Mental status has waxed and waned through the hospital course.  Patient answers questions appropriately.  Not complaining of any pain and has not been taking pain medications.  Per Dr. Leslye Peer, patient's wife hesitant about stopping Mirapex and gabapentin at this point.  Stressed to nursing staff of the importance of BiPAP to prevent CO2 retention.  Continue decreased dose of Seroquel down to 25 mg at night.  CT scan of the head negative on 10/06/2021. Delirium precautions  Osteomyelitis ([place]) Status post left BKA on 10/03/2021. Patient on IV daptomycin. Antibiotics per ID    Acute urinary retention Foley catheter placed on 10/08/2021.  Foley DC'd on 6/16 for voiding trial.  Patient reportedly voiding well with insignificant residuals seen on bladder scans.  Continue to monitor closely.  Acute on chronic diastolic CHF (congestive heart failure) ([place]) Patient's last known LVEF is 55 to 60% from a 2D echocardiogram which was done 01/23.  Net IO Since Admission: -19,704.44 mL [10/12/21 1732] Output yesterday: 3675 cc --Continue Lasix IV 40 mg twice daily.   --Continue metoprolol. --Wean O2 as able --I/O's, daily weights --Cardiology following   Uncontrolled type 2 diabetes mellitus with hyperglycemia, with long-term current use of insulin (HCC) Uncontrolled.  Last hemoglobin A1c in the chart of 10.2.  Continue Semglee insulin to 21 units twice a day.  Continue short acting insulin prior to meals plus sliding scale.  Acute on chronic respiratory failure with hypoxia and hypercapnia (HCC) Requires BiPAP at night in order to to prevent CO2 narcosis and encephalopathy.   Last PCO2  25 -- qualifies for NIV at rehab.  6/17: Chest x-ray with pulmonary edema (infection not excluded), new right possibly loculated pleural effusion, trace left pleural effusion. -- Afebrile and on antibiotics for MRSA bacteremia and osteomyelitis --  Undergoing diuresis past several days, continue -- No clinical signs of pneumonia at this time -- Check procalcitonin and BNP with a.m. labs -- No changes to antibiotics at this time  AF (paroxysmal atrial fibrillation) (HCC) Continue amiodarone.  Eliquis for anticoagulation.   Due to urinary retention, decreased Cardizem CD down to 240 mg daily, increased metoprolol 150 mg twice daily.    6/15: remains in A-flutter with HR's in 100's  Obesity, Class III, BMI 40-49.9 (morbid obesity) (HCC) BMI 50.35 Complicates overall care and prognosis.  Recommend lifestyle modifications including physical activity and diet for weight loss and overall long-term health.   Essential hypertension, benign Continue metoprolol, Cardizem and Lasix  RLS (restless legs syndrome) Continue pramipexole and gabapentin  Hyperkalemia Potassium 5.3 this morning (6/17). Single 5 g Lokelma today Monitor BMP Appears cardiology backing off diuresis  Anemia Hemoglobin up to 9.3 today.  Continue to monitor closely being on blood thinners.    OSA (obstructive sleep apnea) Previously using CPAP at night. Transition to BiPAP secondary to CO2 retention.  Qualifies for NIV with elevated PCO2 on ABG.  Acute respiratory failure with hypoxia and hypercapnia (HCC) See above  Acute metabolic encephalopathy See acute delirium  Generalized weakness PT and OT recommending SNF. Fall precautions Continue PT OT here Out of bed to chair, mobilize as tolerated  Malnutrition of moderate degree Related to acute illness (left heel osteomyelitis) and energy intake of [name] than 75% for more than 7 days, mild fat depletion. -- Appreciate recommendations from dietitian -- Started on Glucerna and ensures, vitamins        Subjective: Patient was sleeping and wearing BiPAP when seen on rounds.  He denies acute complaints and no acute events reported.  No issues with behavior reported but remains intermittently confused as  he has been in the past many days.   Physical Exam: Vitals:   10/14/21 0524 10/14/21 0805 10/14/21 1200 10/14/21 1554  BP: 105/73 117/81 108/77 117/82  Pulse: (!) 105 (!) 103 (!) 107 (!) 108  Resp: '20 18 20 18  '$ Temp: 98.6 F (37 C) 98.3 F (36.8 C) 97.7 F (36.5 C) (!) 97.3 F (36.3 C)  TempSrc: Axillary Oral    SpO2: 93%  90% 96%  Weight:      Height:       General exam: Asleep on BiPAP, no acute distress, morbidly obese Respiratory system: Diminished due to body habitus but overall clear, wearing BiPAP mask, respiratory effort appears normal. Cardiovascular system: tachycardic, unable to visualize JVD due to body habitus, lower extremity edema looks unchanged overall improved but remains Gastrointestinal system: Abdomen nondistended, nontender Central nervous system: Grossly nonfocal exam.  Extremities: Left BKA, right lower extremity with clean dry intact gauze dressing with severe venous stasis changes approximately, RLE edema proximal to dressing further improved Psychiatry: normal mood, congruent affect   Data Reviewed:  Notable labs:  Potassium 5.3, chloride 93, bicarb 41, glucose 145, BUN 27.  Chest x-ray today --  New trace to small right pleural effusion, appears likely loculated.  Trace left pleural effusion.  Pulmonary edema with superimposed infection not excluded. (Repeat PA and lateral chest x-rays in 3 to 4 weeks recommended)  Family Communication: Wife at bedside 6/15.  None present on rounds today  Disposition: Status  is: Inpatient Remains inpatient appropriate because: Remains on IV antibiotics, IV diuretics and requires SNF placement for rehab   Planned Discharge Destination: Skilled nursing facility    Time spent: 35 minutes  Author: Ezekiel Slocumb, DO 10/14/2021 6:56 PM  For on call review [email or web address].

## 2021-10-14 NOTE — Progress Notes (Signed)
Patient's wife upset RN not giving patient ice cream. Patient blood glucose 167 and pt had Ensure Max protein drink, which pt finished all of. Wife Sherri yelling at [employer] over phone stating, "His sugars are high because you're giving him that, and he's not eating because of it." RN attempted to explain supplement is an excellent source of protein to promote wound healing, pt's wife screamed, "I know that! But he should be able to eat ice cream." RN gave pt phone back to talk to his wife. Wife then called back to nursing station yelling at secretary demanding to speak to this RN who was occupied in a patients room. Patient's wife feels pt is not being taken care of the way he should be. Nutrition consult placed as pt's wife stated we are "shoving shakes down his throat and not food."

## 2021-10-14 NOTE — Progress Notes (Signed)
[facility] Cardiology    SUBJECTIVE: Patient resting comfortably in bed short of breath weak with BiPAP mask as he sleeps no worsening symptoms   Vitals:   10/14/21 0029 10/14/21 0346 10/14/21 0524 10/14/21 0805  BP: 112/81  105/73 117/81  Pulse: (!) 105  (!) 105 (!) 103  Resp: '19  20 18  '$ Temp: 97.6 F (36.4 C)  98.6 F (37 C) 98.3 F (36.8 C)  TempSrc: Axillary  Axillary Oral  SpO2: 97%  93%   Weight:  (!) 167.2 kg    Height:         Intake/Output Summary (Last 24 hours) at 10/14/2021 1610 Last data filed at 10/14/2021 0200 Gross per 24 hour  Intake 66 ml  Output 600 ml  Net -534 ml      PHYSICAL EXAM  General: Well developed, well nourished, in no acute distress HEENT:  Normocephalic and atramatic Neck:  No JVD.  Lungs: Clear bilaterally to auscultation and percussion. Heart: HRRR . Normal S1 and S2 without gallops or murmurs.  Abdomen: Bowel sounds are positive, abdomen soft and non-tender  Msk:  Back normal, normal gait. Normal strength and tone for age. Extremities: No clubbing, cyanosis or edema.   Neuro: Alert and oriented X 3. Psych:  Good affect, responds appropriately   LABS: Basic Metabolic Panel: Recent Labs    10/12/21 0629 10/13/21 0633 10/14/21 0728  NA 140 140 139  K 4.7 4.6 5.3*  CL 95* 94* 93*  CO2 42* 40* 41*  GLUCOSE 122* 154* 145*  BUN 22 26* 27*  CREATININE 0.72 0.79 0.76  CALCIUM 8.5* 8.6* 9.3  MG 2.1  --   --    Liver Function Tests: No results for input(s): "AST", "ALT", "ALKPHOS", "BILITOT", "PROT", "ALBUMIN" in the last 72 hours. No results for input(s): "LIPASE", "AMYLASE" in the last 72 hours. CBC: No results for input(s): "WBC", "NEUTROABS", "HGB", "HCT", "MCV", "PLT" in the last 72 hours. Cardiac Enzymes: No results for input(s): "CKTOTAL", "CKMB", "CKMBINDEX", "TROPONINI" in the last 72 hours. BNP: Invalid input(s): "POCBNP" D-Dimer: No results for input(s): "DDIMER" in the last 72 hours. Hemoglobin A1C: No results for  input(s): "HGBA1C" in the last 72 hours. Fasting Lipid Panel: No results for input(s): "CHOL", "HDL", "LDLCALC", "TRIG", "CHOLHDL", "LDLDIRECT" in the last 72 hours. Thyroid Function Tests: No results for input(s): "TSH", "T4TOTAL", "T3FREE", "THYROIDAB" in the last 72 hours.  Invalid input(s): "FREET3" Anemia Panel: No results for input(s): "VITAMINB12", "FOLATE", "FERRITIN", "TIBC", "IRON", "RETICCTPCT" in the last 72 hours.  No results found.     TELEMETRY: Sinus rhythm nonspecific ST-T wave changes: Right bundle branch block left anterior fascicular block  ASSESSMENT AND PLAN:  Principal Problem:   Sepsis due to methicillin resistant Staphylococcus aureus (MRSA) (HCC) Active Problems:   Essential hypertension, benign   RLS (restless legs syndrome)   Osteomyelitis (HCC)   Obesity, Class III, BMI 40-49.9 (morbid obesity) (HCC)   Acute on chronic diastolic CHF (congestive heart failure) (HCC)   AF (paroxysmal atrial fibrillation) ([place])   Uncontrolled type 2 diabetes mellitus with hyperglycemia, with long-term current use of insulin (HCC)   Malnutrition of moderate degree   Generalized weakness   Acute metabolic encephalopathy   Acute respiratory failure with hypoxia and hypercapnia (HCC)   OSA (obstructive sleep apnea)   Acute delirium   Acute on chronic respiratory failure with hypoxia and hypercapnia (HCC)   Anemia   Acute urinary retention    1. Acute on chronic diastolic congestive heart failure,  HFpEF, LVEF 50-55% 05/10/2021, good diuresis after IV furosemide but remains on supplemental O2 2.  Paroxysmal atrial fibrillation/atrial flutter, currently in atrial flutter heart rate 109 bpm, on amiodarone and metoprolol for rate control and Eliquis for stroke prevention, cardizem '240mg'$  CD 3.  Coronary artery disease, status post CABG 2006, PCI RCA 01/2020, currently without chest pain 4.  Left foot osteomyelitis on IV vancomycin, left BKA 6/6 with Dr. Delana Meyer 5.  Acute  kidney injury, improving to Cr 1.03 and >60  6.  MRSA bacteremia, osteomyelitis of L foot likely source, on IV daptomycin, repeat echo ordered by ID, resulted with thickened aortic and mitral leaflets without obvious vegetation. Will need 4 weeks of Abx, per ID 7. Urinary retention, improved after increasing flomax to BID and adding proscar, currently with Foley catheter, planning voiding trial before discharge   Recommendations   1.  Agree with current therapy 2.  Continue IV lasix '40mg'$  BID for today, switch to torsemide '20mg'$  BID starting today 3.  Eliquis '5mg'$  BID restarted 6/9 for stroke risk reduction  4.  Continue amiodarone '200mg'$  once daily, metoprolol tartrate (decrease from '150mg'$  BID to '100mg'$  BID), and cardizem CD '240mg'$  for rate and rhythm control 5.  S/p Left BKA 6/6 6.  Refer to rehab pending rehab bed placement   Yolonda Kida, MD 10/14/2021 9:37 AM

## 2021-10-14 NOTE — TOC Progression Note (Signed)
Transition of Care (TOC) - Progression Note    Patient Details  Name: [name]. MRN: [id number] Date of Birth: Nov 17, 1955  Transition of Care [facility]) CM/SW Contact  Zigmund Daniel Dorian Pod, RN Phone Number: 10/14/2021, 4:01 PM  Clinical Narrative:    Call made to [facility] as requested by the provider. Spoke with Langley Gauss who will request Tammy to call RNCM on status to reconsider pt based upon recent changes for placement.  Currently awaiting a call back to inquire.    Expected Discharge Plan: [place] Barriers to Discharge: Continued Medical Work up  Expected Discharge Plan and Services Expected Discharge Plan: [place] In-house Referral: NA   Post Acute Care Choice: Resumption of Svcs/PTA Provider Living arrangements for the past 2 months: Single Family Home                                       Social Determinants of Health (SDOH) Interventions    Readmission Risk Interventions    09/29/2021   11:12 AM  Readmission Risk Prevention Plan  Transportation Screening Complete  PCP or Specialist Appt within 3-5 Days Complete  HRI or [place] Complete  Social Work Consult for [place] Planning/Counseling Complete  Palliative Care Screening Not Applicable  Medication Review [employer]) Complete

## 2021-10-15 DIAGNOSIS — A4102 Sepsis due to Methicillin resistant Staphylococcus aureus: Secondary | ICD-10-CM | POA: Diagnosis not present

## 2021-10-15 DIAGNOSIS — I5033 Acute on chronic diastolic (congestive) heart failure: Secondary | ICD-10-CM | POA: Diagnosis not present

## 2021-10-15 DIAGNOSIS — I480 Paroxysmal atrial fibrillation: Secondary | ICD-10-CM | POA: Diagnosis not present

## 2021-10-15 DIAGNOSIS — J9621 Acute and chronic respiratory failure with hypoxia: Secondary | ICD-10-CM | POA: Diagnosis not present

## 2021-10-15 LAB — CBC WITH DIFFERENTIAL/PLATELET
Abs Immature Granulocytes: 0.04 10*3/uL (ref 0.00–0.07)
Basophils Absolute: 0.1 10*3/uL (ref 0.0–0.1)
Basophils Relative: 1 %
Eosinophils Absolute: 0.4 10*3/uL (ref 0.0–0.5)
Eosinophils Relative: 4 %
HCT: 35.8 % — ABNORMAL LOW (ref 39.0–52.0)
Hemoglobin: 10.3 g/dL — ABNORMAL LOW (ref 13.0–17.0)
Immature Granulocytes: 0 %
Lymphocytes Relative: 17 %
Lymphs Abs: 1.6 10*3/uL (ref 0.7–4.0)
MCH: 27.5 pg (ref 26.0–34.0)
MCHC: 28.8 g/dL — ABNORMAL LOW (ref 30.0–36.0)
MCV: 95.5 fL (ref 80.0–100.0)
Monocytes Absolute: 0.8 10*3/uL (ref 0.1–1.0)
Monocytes Relative: 8 %
Neutro Abs: 6.3 10*3/uL (ref 1.7–7.7)
Neutrophils Relative %: 70 %
Platelets: 264 10*3/uL (ref 150–400)
RBC: 3.75 MIL/uL — ABNORMAL LOW (ref 4.22–5.81)
RDW: 16.4 % — ABNORMAL HIGH (ref 11.5–15.5)
WBC: 9.1 10*3/uL (ref 4.0–10.5)
nRBC: 0 % (ref 0.0–0.2)

## 2021-10-15 LAB — BASIC METABOLIC PANEL
Anion gap: 4 — ABNORMAL LOW (ref 5–15)
BUN: 29 mg/dL — ABNORMAL HIGH (ref 8–23)
CO2: 42 mmol/L — ABNORMAL HIGH (ref 22–32)
Calcium: 8.9 mg/dL (ref 8.9–10.3)
Chloride: 93 mmol/L — ABNORMAL LOW (ref 98–111)
Creatinine, Ser: 0.78 mg/dL (ref 0.61–1.24)
GFR, Estimated: >60 mL/min (ref >60)
Glucose, Bld: 87 mg/dL (ref 70–99)
Potassium: 4.9 mmol/L (ref 3.5–5.1)
Sodium: 139 mmol/L (ref 135–145)

## 2021-10-15 LAB — PROCALCITONIN: Procalcitonin: <0.1 ng/mL

## 2021-10-15 LAB — GLUCOSE, CAPILLARY
Glucose-Capillary: 85 mg/dL (ref 70–99)
Glucose-Capillary: 215 mg/dL — ABNORMAL HIGH (ref 70–99)
Glucose-Capillary: 222 mg/dL — ABNORMAL HIGH (ref 70–99)
Glucose-Capillary: 251 mg/dL — ABNORMAL HIGH (ref 70–99)

## 2021-10-15 LAB — BRAIN NATRIURETIC PEPTIDE: B Natriuretic Peptide: 295.6 pg/mL — ABNORMAL HIGH (ref 0.0–100.0)

## 2021-10-15 MED ORDER — FUROSEMIDE 10 MG/ML IJ SOLN: 40.0000 mg | Freq: Two times a day (BID) | INTRAMUSCULAR | Status: DC

## 2021-10-15 MED ORDER — MIDODRINE HCL 5 MG PO TABS
2.5000 mg | ORAL_TABLET | Freq: Three times a day (TID) | ORAL | Status: DC
  Administered 2021-10-15 – 2021-10-24 (×26): 2.5 mg via ORAL
  Filled 2021-10-15 (×27): qty 1

## 2021-10-15 MED ORDER — FUROSEMIDE 10 MG/ML IJ SOLN
60.0000 mg | Freq: Two times a day (BID) | INTRAMUSCULAR | Status: DC
  Administered 2021-10-15 – 2021-10-18 (×7): 60 mg via INTRAVENOUS
  Filled 2021-10-15 (×7): qty 6

## 2021-10-15 NOTE — Progress Notes (Addendum)
Progress Note   Patient: [name]. YNW:[id number] DOB: 11-19-1955 DOA: 09/28/2021     17 DOS: the patient was seen and examined on 10/15/2021   Brief hospital course: [name]. is a 66 y.o. male with medical history significant for chronic diastolic dysfunction CHF with LVEF of 50 to 55%, hypertension, coronary artery disease, depression, COPD, A-fib, morbid obesity (BMI 49), obstructive sleep apnea, status post recent wound debridement and partial calcanectomy (05/16) currently on oral antibiotic therapy after completion of IV who presents to the ER for evaluation of bilateral lower extremity swelling as well as increased redness and swelling involving the left lower extremity mostly over the heel. MRI of the foot showed left foot osteomyelitis, placed on vancomycin and Zosyn. Blood culture positive for MRSA secondary to osteomyelitis.   Left BKA performed on 6/6.  Due to volume overload due to acute on chronic diastolic congestive heart failure with secondary hypoxic respiratory failure.  Started IV Lasix 6/6 through 6/17.  Had been transitioned to oral torsemide, however due to persistent hypoxia and worsening chest x-ray with new pleural effusion and pulmonary edema, resumed on IV Lasix at 60 mg twice daily (p.m. on 6/18).  Currently patient on daptomycin as per ID to treat MRSA sepsis and will need to be on for a total of 4 weeks.  Repeat blood cultures negative.  Acute delirium during the entire hospitalization.  Mental status contniues to wax and wane.  Answering questions appropriately.  Continue Seroquel at night and BiPAP at night.  Foley catheter placed on 6/11 secondary to urinary retention.  Flomax increased to twice a day dosing and added Proscar.  Foley removed for voiding trial on 6/16, retention appears resolved.  Voiding well without significant post-void residuals per nursing.    Assessment and Plan: * Sepsis due to methicillin resistant Staphylococcus  aureus (MRSA) ([place]) MRSA growing out of blood cultures on 09/28/2021.  Repeat blood cultures negative.  Currently on daptomycin.  Source of infection likely osteomyelitis.  Will need 4 weeks total of antibiotics total.  Acute delirium Mental status has waxed and waned through the hospital course.  Patient answers questions appropriately.  Not complaining of any pain and has not been taking pain medications.  Per Dr. Leslye Peer, patient's wife hesitant about stopping Mirapex and gabapentin at this point.  Stressed to nursing staff of the importance of BiPAP to prevent CO2 retention.  Continue decreased dose of Seroquel down to 25 mg at night.  CT scan of the head negative on 10/06/2021. Delirium precautions  Acute on chronic diastolic CHF (congestive heart failure) ([place]) Patient's last known LVEF is 55 to 60% from a 2D echocardiogram which was done 01/23.  Net IO Since Admission: -19,704.44 mL [10/12/21 1732] Output yesterday: 3675 cc -- [facility] Cardiology following --Transitioned to p.o. torsemide 20 mg twice daily yesterday per cardiology. --Has persistent hypoxia, chest x-ray yesterday with significant pulmonary edema and new pleural effusion --Resume Lasix IV at 60 mg twice daily.   --Continue metoprolol. --Wean O2 as able --I/O's, daily weights    Osteomyelitis ([place]) Status post left BKA on 10/03/2021. Patient on IV daptomycin. Antibiotics per ID    Acute urinary retention Foley catheter placed on 10/08/2021.  Foley DC'd on 6/16 for voiding trial.  Patient reportedly voiding well with insignificant residuals seen on bladder scans.  Continue to monitor closely.  Uncontrolled type 2 diabetes mellitus with hyperglycemia, with long-term current use of insulin (HCC) Uncontrolled.  Last hemoglobin A1c in the chart  of 10.2.  Continue Semglee insulin to 21 units twice a day.  Continue short acting insulin prior to meals plus sliding scale.  Acute on chronic respiratory failure with hypoxia and  hypercapnia (HCC) Requires BiPAP at night in order to to prevent CO2 narcosis and encephalopathy.   Last PCO2 64 -- qualifies for NIV at rehab.  6/17: Chest x-ray with pulmonary edema (infection not excluded), new right possibly loculated pleural effusion, trace left pleural effusion. -- Afebrile and on antibiotics for MRSA bacteremia and osteomyelitis -- Continue IV diuresis -- Unlikely pneumonia with no focal opacities and negative Pro-Cal, no fevers or leukocytosis   AF (paroxysmal atrial fibrillation) (HCC) Continue amiodarone.  Eliquis for anticoagulation.   Due to urinary retention, decreased Cardizem CD down to 240 mg daily, increased metoprolol 150 mg twice daily.    6/15: remains in A-flutter with HR's in 100's  Obesity, Class III, BMI 40-49.9 (morbid obesity) (HCC) BMI 76.73 Complicates overall care and prognosis.  Recommend lifestyle modifications including physical activity and diet for weight loss and overall long-term health.   Essential hypertension, benign Continue metoprolol, Cardizem and Lasix  RLS (restless legs syndrome) Continue pramipexole and gabapentin  Hyperkalemia Potassium 5.3 this morning (6/17). Single 5 g Lokelma today Monitor BMP Appears cardiology backing off diuresis  Anemia Hemoglobin up to 9.3 today.  Continue to monitor closely being on blood thinners.    OSA (obstructive sleep apnea) Previously using CPAP at night. Transition to BiPAP secondary to CO2 retention.  Qualifies for NIV with elevated PCO2 on ABG.  Acute respiratory failure with hypoxia and hypercapnia (HCC) See above  Acute metabolic encephalopathy See acute delirium  Generalized weakness PT and OT recommending SNF. Fall precautions Continue PT OT here Out of bed to chair, mobilize as tolerated  Malnutrition of moderate degree Related to acute illness (left heel osteomyelitis) and energy intake of Wood than 75% for more than 7 days, mild fat depletion. --  Appreciate recommendations from dietitian -- Started on Glucerna and ensures, vitamins        Subjective: Patient was awake resting bed when seen on rounds today.  He reports shortness of breath with any attempts at exertion.  Says that he does not use home oxygen or BiPAP.  Denies chest pain, palpitations, dizziness or lightheadedness fevers or chills.  No acute events reported mental status seems stable with waxing and waning confusion  Physical Exam: Vitals:   10/15/21 0633 10/15/21 0824 10/15/21 0839 10/15/21 1523  BP:   107/66   Pulse:   (!) 106   Resp:      Temp:      TempSrc:      SpO2:  95%  95%  Weight: (!) 164.2 kg     Height:       General exam: Awake resting in bed, no acute distress, morbidly obese Respiratory system: Diminished bases fine crackles throughout, normal respiratory effort at rest on 4.5 L/min [place] O2. Cardiovascular system: tachycardic, unable to visualize JVD due to body habitus, lower extremity edema looks unchanged overall improved but remains Gastrointestinal system: Abdomen nondistended, nontender Central nervous system: Grossly nonfocal exam.  Extremities: Left BKA, right lower extremity with clean dry intact gauze dressing with severe venous stasis changes approximately, RLE edema proximal to dressing further improved Psychiatry: normal mood, congruent affect   Data Reviewed:  Notable labs:  Potassium 5.3 >>4.9 today, chloride 93, bicarb 42, BUN 29, BNP 295.6, procalcitonin [place] than 0.10, hemoglobin 10.3 stable and improving  Chest x-ray today --  New trace to small right pleural effusion, appears likely loculated.  Trace left pleural effusion.  Pulmonary edema with superimposed infection not excluded. (Repeat PA and lateral chest x-rays in 3 to 4 weeks recommended)  Family Communication: Wife at bedside 6/15.  None present on rounds today  Disposition: Status is: Inpatient Remains inpatient appropriate because: Remains on IV antibiotics, IV  diuretics and requires SNF placement for rehab   Planned Discharge Destination: Skilled nursing facility    Time spent: 35 minutes  Author: Ezekiel Slocumb, DO 10/15/2021 4:11 PM  For on call review [email or web address].

## 2021-10-15 NOTE — Progress Notes (Signed)
[facility] Cardiology    SUBJECTIVE: Patient still complains of some shortness of breath with resting comfortably denies any worsening symptoms since being treated with BiPAP hopefully transitioning to nasal cannula denies any chest pain   Vitals:   10/15/21 0824 10/15/21 0839 10/15/21 1523 10/15/21 2017  BP:  107/66  106/69  Pulse:  (!) 106  (!) 109  Resp:    16  Temp:    98 F (36.7 C)  TempSrc:      SpO2: 95%  95% 96%  Weight:      Height:         Intake/Output Summary (Last 24 hours) at 10/15/2021 2230 Last data filed at 10/15/2021 1932 Gross per 24 hour  Intake --  Output 3000 ml  Net -3000 ml      PHYSICAL EXAM  General: Well developed, well nourished, in no acute distress HEENT:  Normocephalic and atramatic Neck:  No JVD.  Lungs: Clear bilaterally to auscultation and percussion. Heart: HRRR . Normal S1 and S2 without gallops or murmurs.  Abdomen: Bowel sounds are positive, abdomen soft and non-tender  Msk:  Back normal, normal gait. Normal strength and tone for age. Extremities: No clubbing, cyanosis or edema.   Neuro: Alert and oriented X 3. Psych:  Good affect, responds appropriately   LABS: Basic Metabolic Panel: Recent Labs    10/14/21 0728 10/15/21 0548  NA 139 139  K 5.3* 4.9  CL 93* 93*  CO2 41* 42*  GLUCOSE 145* 87  BUN 27* 29*  CREATININE 0.76 0.78  CALCIUM 9.3 8.9   Liver Function Tests: No results for input(s): "AST", "ALT", "ALKPHOS", "BILITOT", "PROT", "ALBUMIN" in the last 72 hours. No results for input(s): "LIPASE", "AMYLASE" in the last 72 hours. CBC: Recent Labs    10/15/21 0548  WBC 9.1  NEUTROABS 6.3  HGB 10.3*  HCT 35.8*  MCV 95.5  PLT 264   Cardiac Enzymes: No results for input(s): "CKTOTAL", "CKMB", "CKMBINDEX", "TROPONINI" in the last 72 hours. BNP: Invalid input(s): "POCBNP" D-Dimer: No results for input(s): "DDIMER" in the last 72 hours. Hemoglobin A1C: No results for input(s): "HGBA1C" in the last 72  hours. Fasting Lipid Panel: No results for input(s): "CHOL", "HDL", "LDLCALC", "TRIG", "CHOLHDL", "LDLDIRECT" in the last 72 hours. Thyroid Function Tests: No results for input(s): "TSH", "T4TOTAL", "T3FREE", "THYROIDAB" in the last 72 hours.  Invalid input(s): "FREET3" Anemia Panel: No results for input(s): "VITAMINB12", "FOLATE", "FERRITIN", "TIBC", "IRON", "RETICCTPCT" in the last 72 hours.  DG Chest Port 1 View  Result Date: 10/14/2021 CLINICAL DATA:  Hypoxia EXAM: PORTABLE CHEST 1 VIEW.  Rotation on frontal view. COMPARISON:  Chest x-ray 09/28/2021, chest x-ray 09/28/2021, chest x-ray 08/09/2009 FINDINGS: The heart and mediastinal contours are within normal limits. Increased interstitial markings. Interval development of a likely loculated trace to small volume right pleural effusion. Possible trace left pleural effusion. No pneumothorax. No acute osseous abnormality. IMPRESSION: 1. Interval development of a likely loculated trace to small volume right pleural effusion. 2. Possible trace left pleural effusion. 3. Pulmonary edema.  Superimposed infection not excluded. 4. Followup PA and lateral chest X-ray is recommended in 3-4 weeks following therapy to ensure resolution and exclude underlying malignancy. Electronically Signed   By: Iven Finn M.D.   On: 10/14/2021 16:57     Echo preserved left ventricular function 55 to 60%  TELEMETRY: Normal sinus rhythm right bundle branch block respiratory hemiblock rate of 70:  ASSESSMENT AND PLAN:  Principal Problem:   Sepsis due to  methicillin resistant Staphylococcus aureus (MRSA) ([place]) Active Problems:   Essential hypertension, benign   RLS (restless legs syndrome)   Osteomyelitis (HCC)   Obesity, Class III, BMI 40-49.9 (morbid obesity) (HCC)   Acute on chronic diastolic CHF (congestive heart failure) (HCC)   AF (paroxysmal atrial fibrillation) ([place])   Uncontrolled type 2 diabetes mellitus with hyperglycemia, with long-term current  use of insulin (HCC)   Malnutrition of moderate degree   Generalized weakness   Acute metabolic encephalopathy   Acute respiratory failure with hypoxia and hypercapnia (HCC)   OSA (obstructive sleep apnea)   Acute delirium   Acute on chronic respiratory failure with hypoxia and hypercapnia (HCC)   Anemia   Acute urinary retention   Hyperkalemia    Plan 1.  Agree with current therapy amiodarone metoprolol Cardizem as necessary 2.  Continue IV lasix '40mg'$  BID for today, switch to torsemide '20mg'$  BID starting today 3.  Eliquis '5mg'$  BID restarted 6/9 for stroke risk reduction  4.  Continue amiodarone '200mg'$  once daily, metoprolol tartrate (decrease from '150mg'$  BID to '100mg'$  BID), and cardizem CD '240mg'$  for rate and rhythm control 5.  S/p Left BKA 6/6 6.  Refer to rehab pending rehab bed placement 7 coronary disease multivessel including coronary bypass surgery 2006 PCI and stent in 21 without any further angina continue current medical therapy 8 urinary retention Foley catheter Flomax follow-up with urology 9 generalized weakness recommend consider physical therapy  Yolonda Kida, MD, 10/15/2021 10:30 PM

## 2021-10-15 NOTE — Progress Notes (Signed)
At bedside to place PIV.  Wife at bedside assisting pt with supper.  Wife requests for IV to wait to be placed so pt can eat.  RN aware PIV placement deferred per request.  RN to place IV Team consult when ready.

## 2021-10-16 DIAGNOSIS — I5033 Acute on chronic diastolic (congestive) heart failure: Secondary | ICD-10-CM | POA: Diagnosis not present

## 2021-10-16 DIAGNOSIS — J9621 Acute and chronic respiratory failure with hypoxia: Secondary | ICD-10-CM | POA: Diagnosis not present

## 2021-10-16 DIAGNOSIS — I480 Paroxysmal atrial fibrillation: Secondary | ICD-10-CM | POA: Diagnosis not present

## 2021-10-16 DIAGNOSIS — A4102 Sepsis due to Methicillin resistant Staphylococcus aureus: Secondary | ICD-10-CM | POA: Diagnosis not present

## 2021-10-16 LAB — BASIC METABOLIC PANEL
Anion gap: 7 (ref 5–15)
BUN: 34 mg/dL — ABNORMAL HIGH (ref 8–23)
CO2: 42 mmol/L — ABNORMAL HIGH (ref 22–32)
Calcium: 8.5 mg/dL — ABNORMAL LOW (ref 8.9–10.3)
Chloride: 90 mmol/L — ABNORMAL LOW (ref 98–111)
Creatinine, Ser: 0.86 mg/dL (ref 0.61–1.24)
GFR, Estimated: >60 mL/min (ref >60)
Glucose, Bld: 179 mg/dL — ABNORMAL HIGH (ref 70–99)
Potassium: 5.2 mmol/L — ABNORMAL HIGH (ref 3.5–5.1)
Sodium: 139 mmol/L (ref 135–145)

## 2021-10-16 LAB — CK: Total CK: 16 U/L — ABNORMAL LOW (ref 49–397)

## 2021-10-16 LAB — GLUCOSE, CAPILLARY
Glucose-Capillary: 183 mg/dL — ABNORMAL HIGH (ref 70–99)
Glucose-Capillary: 202 mg/dL — ABNORMAL HIGH (ref 70–99)
Glucose-Capillary: 163 mg/dL — ABNORMAL HIGH (ref 70–99)
Glucose-Capillary: 170 mg/dL — ABNORMAL HIGH (ref 70–99)

## 2021-10-16 LAB — MAGNESIUM: Magnesium: 2.1 mg/dL (ref 1.7–2.4)

## 2021-10-16 MED ORDER — DILTIAZEM HCL ER COATED BEADS 180 MG PO CP24
180.0000 mg | ORAL_CAPSULE | Freq: Every day | ORAL | Status: DC
  Administered 2021-10-16 – 2021-10-18 (×3): 180 mg via ORAL
  Filled 2021-10-16 (×3): qty 1

## 2021-10-16 MED ORDER — SODIUM ZIRCONIUM CYCLOSILICATE 10 G PO PACK
10.0000 g | PACK | Freq: Once | ORAL | Status: AC
  Administered 2021-10-16: 10 g via ORAL
  Filled 2021-10-16: qty 1

## 2021-10-16 NOTE — Plan of Care (Signed)

## 2021-10-16 NOTE — TOC Progression Note (Signed)
Transition of Care (TOC) - Progression Note    Patient Details  Name: [name]. MRN: [id number] Date of Birth: 01/07/1956  Transition of Care [facility]) CM/SW Contact  Laurena Slimmer, RN Phone Number: 10/16/2021, 10:18 AM  Clinical Narrative:    Per patient request clinical sent to [facility] in addition to local SNF's including [facility], [facility], [facility], and [facility]. Updated clinical sent  with message for to facilities to reconsider offer.   Expected Discharge Plan: [place] Barriers to Discharge: Continued Medical Work up  Expected Discharge Plan and Services Expected Discharge Plan: [place] In-house Referral: NA   Post Acute Care Choice: Resumption of Svcs/PTA Provider Living arrangements for the past 2 months: Single Family Home                                       Social Determinants of Health (SDOH) Interventions    Readmission Risk Interventions    09/29/2021   11:12 AM  Readmission Risk Prevention Plan  Transportation Screening Complete  PCP or Specialist Appt within 3-5 Days Complete  HRI or [place] Complete  Social Work Consult for [place] Planning/Counseling Complete  Palliative Care Screening Not Applicable  Medication Review Press photographer) Complete

## 2021-10-16 NOTE — Progress Notes (Signed)
[facility] Cardiology  Patient ID: [name]. MRN: [id number] DOB/AGE: May 24, 1955 66 y.o.   Admit date: 09/28/2021 Referring Physician Dr. Francine Graven Primary Physician Dr. Felipa Furnace Primary Cardiologist Dr. Donnelly Angelica Reason for Consultation acute on chronic HFpEF    HPI: Curtez "Alec" Keyion Wood. is a [phone] with a PMH significant for CAD s/p CABG 2006 and PCI of RCA 01/2020, HFpEF (LVEF 50-55% 04/2021), A. fib / flutter on apixaban, amiodarone and digoxin, HTN, BPH, morbid obesity, poorly controlled type 2 diabetes,  PAD s/p left third toe amputation, chronic venous stasis, OSA on CPAP who presented to [facility] ED on 09/28/21 with increasing pain and swelling in his left leg, and imaging concerning for worsening osteomyelitis of his left foot. He has has onoging infections needing wound debridement in his left lower leg over the past several months. Cardiology is consulted for assistance with his heart failure.   Interval History:  -no acute events -continues to do his leg exercises in bed -intermittent issues with memory. No chest pain, dizziness, SOB   Vitals:   10/16/21 0529 10/16/21 0600 10/16/21 0751 10/16/21 0833  BP: 90/64  99/72 111/82  Pulse: (!) 107  (!) 107 (!) 107  Resp: 14  (!) 25 18  Temp: 98.1 F (36.7 C)  98.1 F (36.7 C)   TempSrc: Axillary  Oral   SpO2: 94%  90% 95%  Weight:  (!) 164.7 kg    Height:  '6\' 4"'$  (1.93 m)       Intake/Output Summary (Last 24 hours) at 10/16/2021 0943 Last data filed at 10/16/2021 0700 Gross per 24 hour  Intake 0 ml  Output 2500 ml  Net -2500 ml    PHYSICAL EXAM General: Ill-appearing Caucasian male,  in no acute distress.  Laying at incline in hospital bed without family at bedside.   HEENT:  Normocephalic and atraumatic. Neck:   No JVD.  Lungs: Normal respiratory effort on oxygen by [place]. Clear bilaterally to auscultation. No wheezes, crackles, rhonchi.  Heart: Tachy but regular. Normal S1 and S2 without gallops or murmurs.   Abdomen: Obese appearing.  Msk: Normal strength and tone for age. Extremities:  RLE with venous stasis changes and woody appearance to skin,  Left leg BKA with dry padded dressing, lateral part of wound visible with well approximated wound edges with stables present, no purulence or erythema.  Both legs are much softer and Wood edematous than on presentation Neuro: Alert and oriented x3 Psych: answers questions appropriately    LABS: Basic Metabolic Panel: Recent Labs    10/15/21 0548 10/16/21 0651  NA 139 139  K 4.9 5.2*  CL 93* 90*  CO2 42* 42*  GLUCOSE 87 179*  BUN 29* 34*  CREATININE 0.78 0.86  CALCIUM 8.9 8.5*  MG  --  2.1    Liver Function Tests: No results for input(s): "AST", "ALT", "ALKPHOS", "BILITOT", "PROT", "ALBUMIN" in the last 72 hours.  No results for input(s): "LIPASE", "AMYLASE" in the last 72 hours. CBC: Recent Labs    10/15/21 0548  WBC 9.1  NEUTROABS 6.3  HGB 10.3*  HCT 35.8*  MCV 95.5  PLT 264    Cardiac Enzymes: Recent Labs    10/16/21 0651  CKTOTAL 16*    BNP: Invalid input(s): "POCBNP" D-Dimer: No results for input(s): "DDIMER" in the last 72 hours. Hemoglobin A1C: No results for input(s): "HGBA1C" in the last 72 hours. Fasting Lipid Panel: No results for input(s): "CHOL", "HDL", "LDLCALC", "TRIG", "CHOLHDL", "LDLDIRECT" in the  last 72 hours. Thyroid Function Tests: No results for input(s): "TSH", "T4TOTAL", "T3FREE", "THYROIDAB" in the last 72 hours.  Invalid input(s): "FREET3" Anemia Panel: No results for input(s): "VITAMINB12", "FOLATE", "FERRITIN", "TIBC", "IRON", "RETICCTPCT" in the last 72 hours.  DG Chest Port 1 View  Result Date: 10/14/2021 CLINICAL DATA:  Hypoxia EXAM: PORTABLE CHEST 1 VIEW.  Rotation on frontal view. COMPARISON:  Chest x-ray 09/28/2021, chest x-ray 09/28/2021, chest x-ray 08/09/2009 FINDINGS: The heart and mediastinal contours are within normal limits. Increased interstitial markings. Interval  development of a likely loculated trace to small volume right pleural effusion. Possible trace left pleural effusion. No pneumothorax. No acute osseous abnormality. IMPRESSION: 1. Interval development of a likely loculated trace to small volume right pleural effusion. 2. Possible trace left pleural effusion. 3. Pulmonary edema.  Superimposed infection not excluded. 4. Followup PA and lateral chest X-ray is recommended in 3-4 weeks following therapy to ensure resolution and exclude underlying malignancy. Electronically Signed   By: Iven Finn M.D.   On: 10/14/2021 16:57     Echo LVEF 50-55% 05/10/2021  TELEMETRY: Atrial fibrillation 111 bpm:  ASSESSMENT AND PLAN:  Principal Problem:   Sepsis due to methicillin resistant Staphylococcus aureus (MRSA) (HCC) Active Problems:   Essential hypertension, benign   RLS (restless legs syndrome)   Osteomyelitis (HCC)   Obesity, Class III, BMI 40-49.9 (morbid obesity) (HCC)   Acute on chronic diastolic CHF (congestive heart failure) (HCC)   AF (paroxysmal atrial fibrillation) ([place])   Uncontrolled type 2 diabetes mellitus with hyperglycemia, with long-term current use of insulin (HCC)   Malnutrition of moderate degree   Generalized weakness   Acute metabolic encephalopathy   Acute respiratory failure with hypoxia and hypercapnia (HCC)   OSA (obstructive sleep apnea)   Acute delirium   Acute on chronic respiratory failure with hypoxia and hypercapnia (HCC)   Anemia   Acute urinary retention   Hyperkalemia    1. Acute on chronic diastolic congestive heart failure, HFpEF, LVEF 50-55% 05/10/2021, good diuresis after IV furosemide but remains on supplemental O2 2.  Paroxysmal atrial fibrillation/atrial flutter, currently in atrial flutter heart rate 109 bpm, on amiodarone and metoprolol for rate control and Eliquis for stroke prevention, cardizem '240mg'$  CD 3.  Coronary artery disease, status post CABG 2006, PCI RCA 01/2020, currently without chest  pain 4.  Left foot osteomyelitis on IV vancomycin, left BKA 6/6 with Dr. Delana Meyer 5.  Acute kidney injury, improving to Cr 1.03 and >60  6.  MRSA bacteremia, osteomyelitis of L foot likely source, on IV daptomycin, repeat echo ordered by ID, resulted with thickened aortic and mitral leaflets without obvious vegetation. Will need 4 weeks of Abx, per ID 7. Urinary retention, improved after increasing flomax to BID and adding proscar, currently with Foley catheter, planning voiding trial before discharge   Recommendations   1.  Agree with current therapy 2.  s/p IV lasix '40mg'$  IV BID  - Continue torsemide '20mg'$  PO BID  3.  Eliquis '5mg'$  BID restarted 6/9 for stroke risk reduction  4.  Continue amiodarone '200mg'$  once daily, metoprolol tartrate '100mg'$  BID, and cut back cardizem CD from '240mg'$  to '180mg'$  for rate and rhythm control with softer BPs 5.  S/p Left BKA 6/6 6.  Pending rehab bed placement   This patient's plan of care was discussed and created with Dr. Corky Sox and he is in agreement.    Tristan Schroeder, PA-C 10/16/2021 9:43 AM

## 2021-10-16 NOTE — Evaluation (Signed)
Occupational Therapy Re-Evaluation Patient Details Name: [name]. MRN: [id number] DOB: 04/01/1956 Today's Date: 10/16/2021   History of Present Illness [name] is a 32yoM who [facility] c progression of Left chornic heel ulcer. MRI showed significant progressive osteomyelitis in the left calcaneus. PMH HTN, IDDM2, morbid obesity, depression, CAD s/p CABG 2006 and PCI to RCA in 12/2020, chronic B venous stasis on Unna boots, chronic L foot ulcer, COPD, OSA on CPAP nightly, ai-fib, PAD, s/p multiple toe amputations.  S/p L BKA on 10/03/21.   Clinical Impression     Pt seen for re-evaluation this session. Pt continues to be pleasant but confused throughout session. Pt agreeable to transfer into recliner chair with use of lift equipment. Pt rolling L <> R with max of 1 and second helper to place hoyer sling. Harrel Lemon utilized to transfer pt into Financial trader with NT assisting as well. Hoyer sling left under pt to increase staff's ability to safely transfer pt back to bed. RN also aware of pt's position in chair. Call bell placed within reach. Pt does appear fatigued with activity and resting comfortably in chair.    Recommendations for follow up therapy are one component of a multi-disciplinary discharge planning process, led by the attending physician.  Recommendations may be updated based on patient status, additional functional criteria and insurance authorization.   Follow Up Recommendations  Skilled nursing-short term rehab (<3 hours/day)    Assistance Recommended at Discharge Frequent or constant Supervision/Assistance  Patient can return home with the following A lot of help with bathing/dressing/bathroom;Assistance with cooking/housework;Help with stairs or ramp for entrance;Assist for transportation;Two people to help with walking and/or transfers       Equipment Recommendations  Other (comment) (defer to next venue of care)       Precautions / Restrictions  Precautions Precautions: Fall Precaution Comments: Contact precautions      Mobility Bed Mobility Overal bed mobility: Needs Assistance Bed Mobility: Rolling Rolling: Max assist                          ADL either performed or assessed with clinical judgement      Vision Patient Visual Report: No change from baseline              Pertinent Vitals/Pain Pain Assessment Pain Assessment: Faces Faces Pain Scale: Hurts a little bit Pain Location: generalized Pain Descriptors / Indicators: Discomfort Pain Intervention(s): Limited activity within patient's tolerance, Monitored during session, Repositioned        Extremity/Trunk Assessment Upper Extremity Assessment Upper Extremity Assessment: Generalized weakness   Lower Extremity Assessment Lower Extremity Assessment: Generalized weakness          Cognition Arousal/Alertness: Awake/alert Behavior During Therapy: WFL for tasks assessed/performed Overall Cognitive Status: Impaired/Different from baseline                                                                      OT Goals(Current goals can be found in the care plan section) Acute Rehab OT Goals Patient Stated Goal: to get better OT Goal Formulation: With patient Time For Goal Achievement: 11/13/21 Potential to Achieve Goals: Fair  OT Frequency: Min 2X/week  AM-PAC OT "6 Clicks" Daily Activity     Outcome Measure Help from another person eating meals?: None Help from another person taking care of personal grooming?: A Lot Help from another person toileting, which includes using toliet, bedpan, or urinal?: Total Help from another person bathing (including washing, rinsing, drying)?: Total Help from another person to put on and taking off regular upper body clothing?: A Lot Help from another person to put on and taking off regular lower body clothing?: Total 6 Click Score: 11   End of Session Equipment  Utilized During Treatment: Oxygen Nurse Communication: Mobility status  Activity Tolerance: Patient limited by fatigue Patient left: in bed;with call bell/phone within reach;with bed alarm set  OT Visit Diagnosis: Unsteadiness on feet (R26.81);Repeated falls (R29.6);Muscle weakness (generalized) (M62.81)                Time: 4707-6151 OT Time Calculation (min): 32 min Charges:  OT General Charges $OT Visit: 1 Visit OT Evaluation $OT Re-eval: 1 Re-eval OT Treatments $Therapeutic Activity: 23-37 mins Darleen Crocker, MS, OTR/L , CBIS ascom [phone]  10/16/21, 3:04 PM

## 2021-10-16 NOTE — Progress Notes (Addendum)
Progress Note   Patient: [name]. EPP:[id number] DOB: [birth year] DOA: 09/28/2021     18 DOS: the patient was seen and examined on 10/16/2021   Brief hospital course: [name]. is a 66 y.o. male with medical history significant for chronic diastolic dysfunction CHF with LVEF of 50 to 55%, hypertension, coronary artery disease, depression, COPD, A-fib, morbid obesity (BMI 49), obstructive sleep apnea, status post recent wound debridement and partial calcanectomy (05/16) currently on oral antibiotic therapy after completion of IV who presents to the ER for evaluation of bilateral lower extremity swelling as well as increased redness and swelling involving the left lower extremity mostly over the heel. MRI of the foot showed left foot osteomyelitis, placed on vancomycin and Zosyn. Blood culture positive for MRSA secondary to osteomyelitis.   Left BKA performed on 6/6.  Due to volume overload due to acute on chronic diastolic congestive heart failure with secondary hypoxic respiratory failure.  Started IV Lasix 6/6 through 6/17.  Had been transitioned to oral torsemide, however due to persistent hypoxia and worsening chest x-ray with new pleural effusion and pulmonary edema, resumed on IV Lasix at 60 mg twice daily (p.m. on 6/18).  Currently patient on daptomycin as per ID to treat MRSA sepsis and will need to be on for a total of 4 weeks.  Repeat blood cultures negative.  Acute delirium during the entire hospitalization.  Mental status contniues to wax and wane.  Answering questions appropriately.  Continue Seroquel at night and BiPAP at night.  Foley catheter placed on 6/11 secondary to urinary retention.  Flomax increased to twice a day dosing and added Proscar.  Foley removed for voiding trial on 6/16, retention appears resolved.  Voiding well without significant post-void residuals per nursing.    Assessment and Plan: * Sepsis due to methicillin resistant Staphylococcus  aureus (MRSA) ([place]) MRSA growing out of blood cultures on 09/28/2021.  Repeat blood cultures negative.  Currently on daptomycin.  Source of infection likely osteomyelitis.  Will need 4 weeks total of antibiotics total.  Acute delirium Mental status has waxed and waned through the hospital course.  Patient answers questions appropriately.  Not complaining of any pain and has not been taking pain medications.  Per Dr. Leslye Peer, patient's wife hesitant about stopping Mirapex and gabapentin at this point.  Stressed to nursing staff of the importance of BiPAP to prevent CO2 retention.  Continue decreased dose of Seroquel down to 25 mg at night.  CT scan of the head negative on 10/06/2021. Delirium precautions  Acute on chronic diastolic CHF (congestive heart failure) ([name]) Patient's last known LVEF is 55 to 60% from a 2D echocardiogram which was done 01/23.  Net IO Since Admission: -19,704.44 mL [10/12/21 1732] Output yesterday: 3675 cc -- [facility] following --Transitioned to p.o. torsemide 20 mg twice daily yesterday per cardiology. --Has persistent hypoxia, chest x-ray yesterday with significant pulmonary edema and new pleural effusion --Resume Lasix IV at 60 mg twice daily.   --Continue metoprolol. --Wean O2 as able --I/O's, daily weights    Osteomyelitis ([place]) Status post left BKA on 10/03/2021. Patient on IV daptomycin. Antibiotics per ID    Acute urinary retention Foley catheter placed on 10/08/2021.  Foley DC'd on 6/16 for voiding trial.  Patient reportedly voiding well with insignificant residuals seen on bladder scans.  Continue to monitor closely.  Uncontrolled type 2 diabetes mellitus with hyperglycemia, with long-term current use of insulin (HCC) Uncontrolled.  Last hemoglobin A1c in the chart  of 10.2.  Continue Semglee insulin to 21 units twice a day.  Continue short acting insulin prior to meals plus sliding scale.  Acute on chronic respiratory failure with hypoxia and  hypercapnia (HCC) Requires BiPAP at night in order to to prevent CO2 narcosis and encephalopathy.   Last PCO2 64 -- qualifies for NIV at rehab.  6/17: Chest x-ray with pulmonary edema (infection not excluded), new right possibly loculated pleural effusion, trace left pleural effusion. -- Afebrile and on antibiotics for MRSA bacteremia and osteomyelitis -- Continue IV diuresis -- Unlikely pneumonia with no focal opacities and negative Pro-Cal, no fevers or leukocytosis   AF (paroxysmal atrial fibrillation) (HCC) Continue amiodarone.  Eliquis for anticoagulation.   Due to urinary retention, decreased Cardizem CD down to 240 mg daily, increased metoprolol 150 mg twice daily.    6/15: remains in A-flutter with HR's in 100's  Obesity, Class III, BMI 40-49.9 (morbid obesity) (HCC) BMI 87.86 Complicates overall care and prognosis.  Recommend lifestyle modifications including physical activity and diet for weight loss and overall long-term health.   Essential hypertension, benign Continue metoprolol, Cardizem and Lasix  RLS (restless legs syndrome) Continue pramipexole and gabapentin  Hyperkalemia Potassium 5.3 this morning (6/17). Treated with 5 g Lokelma on 6/17, K improved to 4.9.   This morning, K up to 5.2 again, will give 10 g Lokelma Monitor BMP  Anemia Hemoglobin up to 9.3 today.  Continue to monitor closely being on blood thinners.    OSA (obstructive sleep apnea) Previously using CPAP at night. Transition to BiPAP secondary to CO2 retention.  Qualifies for NIV with elevated PCO2 on ABG.  Acute respiratory failure with hypoxia and hypercapnia (HCC) See above  Acute metabolic encephalopathy See acute delirium  Generalized weakness PT and OT recommending SNF. Fall precautions Continue PT OT here Out of bed to chair, mobilize as tolerated  Malnutrition of moderate degree Related to acute illness (left heel osteomyelitis) and energy intake of Wood than 75% for more  than 7 days, mild fat depletion. -- Appreciate recommendations from dietitian -- Started on Glucerna and ensures, vitamins        Subjective: Patient was awake resting bed when seen on rounds today.  He he talks about loud noises that the "breathing machine" was making.  Remains mildly confused but answers questions appropriately.  Denies feeling short of breath at rest, having any chest pain or palpitations PE.  No fevers chills or significant cough.  No acute events reported.  Physical Exam: Vitals:   10/16/21 0751 10/16/21 0833 10/16/21 1051 10/16/21 1206  BP: 99/72 111/82 111/69 97/68  Pulse: (!) 107 (!) 107 (!) 109 (!) 106  Resp: (!) '25 18 18 17  '$ Temp: 98.1 F (36.7 C)  98.7 F (37.1 C) 98.2 F (36.8 C)  TempSrc: Oral  Oral Oral  SpO2: 90% 95% 93% 90%  Weight:      Height:       General exam: Awake resting in bed, no acute distress, morbidly obese Respiratory system: Lungs clear clear with diminished bases and faint crackles.  Normal respiratory effort at rest.  On 4 L/min [place] O2 with O2 sats 90 to 95% Cardiovascular system: Tachycardic, unable to visualize JVD due to body habitus, lower extremity edema further improved Gastrointestinal system: Abdomen soft and nontender Central nervous system: Normal speech, grossly nonfocal exam Extremities: Left BKA, right lower extremity with clean dry intact gauze dressing with severe venous stasis changes approximately, RLE edema continues to improve  Psychiatry: normal  mood, congruent affect   Data Reviewed:  Notable labs: Stable renal function on IV diuresis creatinine 0.86 from 0.78.  Potassium 5.2, chloride 90, bicarb 42, glucose 179, BUN 34, calcium 8.5, CK16 (low)  Family Communication: Wife at bedside 6/15.  None present on rounds today  Disposition: Status is: Inpatient Remains inpatient appropriate because: Remains on IV antibiotics, IV diuretics and requires SNF placement for rehab   Planned Discharge Destination:  Skilled nursing facility    Time spent: 35 minutes  Author: Ezekiel Slocumb, DO 10/16/2021 2:13 PM  For on call review [email or web address].

## 2021-10-16 NOTE — Progress Notes (Signed)
Initial Nutrition Assessment  DOCUMENTATION CODES:   Morbid obesity, Non-severe (moderate) malnutrition in context of acute illness/injury  INTERVENTION:   -D/c Glucerna shake -Continue Ensure Max po daily, each supplement provides 150 kcal and 30 grams of protein -Continue MVI with minerals daily -Continue 500 mg vitamin C BID -Double protein portions with meals -Feeding assistance with meals -HS snack daily  NUTRITION DIAGNOSIS:   Moderate Malnutrition related to acute illness (L heel osteomyelitis) as evidenced by energy intake < 75% for > 7 days, mild fat depletion.  Ongoing  GOAL:   Patient will meet greater than or equal to 90% of their needs  Progressing   MONITOR:   PO intake, Supplement acceptance, Labs, Weight trends, I & O's  REASON FOR ASSESSMENT:   Consult Wound healing  ASSESSMENT:   Pt admitted with wound infection. Found to have osteomyelitis of L heel. PMH significant for uncontrolled Y1EH, chronic diastolic dysfunction CHF, HTN, CAD, COPD, afib, morbid obesity, OSA, s/p recent wound debridement and partial calcanectomy (05/16).  6/6- s/p lt BKA  Reviewed I/O's: -2.5 L x 24 hours and -24 L since 10/02/21  UOP: 2.5 L x 24 hours  RD re-consulted due to pt's wife concern about meals and supplements.   Spoke with pt at bedside, who reports feeling better today. He reports that he is always hungry and is eating most of his food. He is looking forward to lunch. Noted meal completions 75-100%.   Pt reports he is drinking supplements, however, does not really care for them. He states "they are really only helpful for me to get my meds down".  Spoke with pt's wife Haematologist) over the phone. She reports concern about pt drinking multiple protein shakes and episodes of hyperglycemia. She reports that she has observed pt being given nutritional shakes right before his meals, which often deters him from eating. She also shares that some of the supplements that are  given are high in sugar, which causes the hyperglyemia.She is not against pt receiving supplements to help with his protein intake, but would like for RD to re-evaluate regimen. Pt wife shares that she or pt's granddaughter often visit and help pt with 1-2 meals, however, observes that pt often has difficulty feeding himself secondary to decreased dexterity and mental status. Pt typically eats an HS snack before bed and this helps with his hunger pains. RD discussed importance of good meal intake, supplements, and optimal glycemic control to assist with post-operative healing. Reviewed meal and supplement options with pt wife; amenable to HS snack, feeding assistance, double protein portions with meals, and Ensure Max, as this will help with pt satiety and decrease carbohydrate content/ use of supplements. Pt wife had many concerns, which RD actively listened to and provided reflective listening and emotional support. RD spent over 25 minutes on the phone with pt wife addressing her concerns and reviewing care plan. Pt wife appreciative of RD calling for update.   Per TOC notes, plan for SNF placement once medically stable.  Medications reviewed and include vitamin C, vitamin C, cardizem, ferrous sulfate, lasix, and vitamin B-12.   Labs reviewed: K: 5.2, CBGS: 163-222 (inpatient orders for glycemic control are 0-20 units insulin aspart TID with meals, 6 units insulin glargine-yfgn BID).    Diet Order:   Diet Order             Diet Carb Modified Fluid consistency: Thin; Room service appropriate? Yes; Fluid restriction: 1800 mL Fluid  Diet effective now  EDUCATION NEEDS:   Not appropriate for education at this time  Skin:  Skin Assessment: Skin Integrity Issues: Skin Integrity Issues:: Other (Comment), Incisions Wound Vac: - Incisions: s/p lt BKA Other: venous statsis ulcer to rt pretibial, MASD to abdominal folds and groin  Last BM:  10/15/21  Height:   Ht Readings  from Last 1 Encounters:  10/16/21 '6\' 4"'$  (1.93 m)    Weight:   Wt Readings from Last 1 Encounters:  10/16/21 (!) 164.7 kg    Ideal Body Weight:  91.8 kg  BMI:  Body mass index is 44.19 kg/m.  Estimated Nutritional Needs:   Kcal:  2100-2300  Protein:  120-140 grams  Fluid:  > 2 L    Loistine Chance, RD, LDN, [place] Registered Dietitian II Certified Diabetes Care and Education Specialist Please refer to [facility] for RD and/or RD on-call/weekend/after hours pager

## 2021-10-16 NOTE — Progress Notes (Signed)
PT Cancellation Note  Patient Details Name: [name]. MRN: [id number] DOB: [birth year]   Cancelled Treatment:     Pt was just transferred to bedside chair via [employer] with OT. Continue PT per POC, next available ate/time.   Josie Dixon 10/16/2021, 2:51 PM

## 2021-10-16 NOTE — Progress Notes (Signed)
   10/16/21 0529  Assess: MEWS Score  Temp 98.1 F (36.7 C)  BP 90/64  MAP (mmHg) 73  Pulse Rate (!) 107  Resp 14  Level of Consciousness Alert  SpO2 94 %  O2 Device Bi-PAP  O2 Flow Rate (L/min) 4 L/min  Assess: MEWS Score  MEWS Temp 0  MEWS Systolic 1  MEWS Pulse 1  MEWS RR 0  MEWS LOC 0  MEWS Score 2  MEWS Score Color Yellow  Assess: if the MEWS score is Yellow or Red  Were vital signs taken at a resting state? Yes  Focused Assessment No change from prior assessment  Does the patient meet 2 or more of the SIRS criteria? No  MEWS guidelines implemented *See Row Information* No, other (Comment)  Treat  MEWS Interventions Administered scheduled meds/treatments  Pain Scale 0-10  Pain Score 0  Take Vital Signs  Increase Vital Sign Frequency  Yellow: Q 2hr X 2 then Q 4hr X 2, if remains yellow, continue Q 4hrs  Notify: Charge Nurse/RN  Name of Charge Nurse/RN Notified Lindsey, RN  Date Charge Nurse/RN Notified 10/16/21  Time Charge Nurse/RN Notified 0530  Document  Patient Outcome Other (Comment) (Patient is stable, Vital signs didn't post at midnight, went back and got the vital signs and realized he was a yellow, starting yellow mews now.)  Assess: SIRS CRITERIA  SIRS Temperature  0  SIRS Pulse 1  SIRS Respirations  0  SIRS WBC 0  SIRS Score Sum  1

## 2021-10-17 ENCOUNTER — Inpatient Hospital Stay: Payer: Medicare Other

## 2021-10-17 DIAGNOSIS — I480 Paroxysmal atrial fibrillation: Secondary | ICD-10-CM | POA: Diagnosis not present

## 2021-10-17 DIAGNOSIS — R531 Weakness: Secondary | ICD-10-CM | POA: Diagnosis not present

## 2021-10-17 DIAGNOSIS — I5033 Acute on chronic diastolic (congestive) heart failure: Secondary | ICD-10-CM | POA: Diagnosis not present

## 2021-10-17 DIAGNOSIS — J9621 Acute and chronic respiratory failure with hypoxia: Secondary | ICD-10-CM | POA: Diagnosis not present

## 2021-10-17 LAB — BASIC METABOLIC PANEL
Anion gap: 6 (ref 5–15)
BUN: 32 mg/dL — ABNORMAL HIGH (ref 8–23)
CO2: 44 mmol/L — ABNORMAL HIGH (ref 22–32)
Calcium: 8.7 mg/dL — ABNORMAL LOW (ref 8.9–10.3)
Chloride: 92 mmol/L — ABNORMAL LOW (ref 98–111)
Creatinine, Ser: 0.77 mg/dL (ref 0.61–1.24)
GFR, Estimated: >60 mL/min (ref >60)
Glucose, Bld: 92 mg/dL (ref 70–99)
Potassium: 4.6 mmol/L (ref 3.5–5.1)
Sodium: 142 mmol/L (ref 135–145)

## 2021-10-17 LAB — GLUCOSE, CAPILLARY
Glucose-Capillary: 77 mg/dL (ref 70–99)
Glucose-Capillary: 173 mg/dL — ABNORMAL HIGH (ref 70–99)
Glucose-Capillary: 153 mg/dL — ABNORMAL HIGH (ref 70–99)
Glucose-Capillary: 111 mg/dL — ABNORMAL HIGH (ref 70–99)
Glucose-Capillary: 161 mg/dL — ABNORMAL HIGH (ref 70–99)

## 2021-10-17 NOTE — Progress Notes (Signed)
Physical Therapy Treatment Patient Details Name: [name]. MRN: [id number] DOB: 10-Jun-1955 Today's Date: 10/17/2021   History of Present Illness [name] is a 2yoM who [facility] 09/[facility]21 c progression of Left chornic heel ulcer. MRI showed significant progressive osteomyelitis in the left calcaneus. PMH HTN, IDDM2, morbid obesity, depression, CAD s/p CABG 2006 and PCI to RCA in 12/2020, chronic B venous stasis on [place] boots, chronic L foot ulcer, COPD, OSA on CPAP nightly, ai-fib, PAD, s/p multiple toe amputations.  S/p L BKA on 10/03/21.    PT Comments    Patient alert throughout session, with varying levels of orientation. Does recall that he is at [facility] at end of session, but disoriented to time and situation most of the time. Able to follow commands. He performed a few supine BLE exercises, AAROM-AROM as needed. Pt performed supine to sit with bed rails, extra time, and maxAx2. Needed maxA to reposition in midline as well, but when repositioned able to maintain sitting balance with supervision-CGA. He was able to laterally lean all the way R/L to prop on elbow, and then returned to midline, CGA. Session focused on attempting to promote weight bearing in RLE, proper UE placement, and trying to clear buttocks from bed in preparation for lateral scoot transfers. Anterior weight shift to clear buttocks performed three times, maxAx2 and constant cues and for sequencing. Second attempt demonstrated best clearance though still limited. Returned to supine maxAx2 with all needs in reach. The patient would benefit from further skilled PT intervention to continue to progress towards goals. Recommendation remains appropriate.      Recommendations for follow up therapy are one component of a multi-disciplinary discharge planning process, led by the attending physician.  Recommendations may be updated based on patient status, additional functional criteria and insurance  authorization.  Follow Up Recommendations  Skilled nursing-short term rehab (<3 hours/day)     Assistance Recommended at Discharge Frequent or constant Supervision/Assistance  Patient can return home with the following Two people to help with bathing/dressing/bathroom;Two people to help with walking and/or transfers;Assistance with cooking/housework;Assist for transportation;Help with stairs or ramp for entrance;Direct supervision/assist for medications management;Direct supervision/assist for financial management   Equipment Recommendations  Other (comment) (defer to next level of care)    Recommendations for Other Services       Precautions / Restrictions Precautions Precautions: Fall Precaution Comments: Contact precautions Restrictions Other Position/Activity Restrictions: LLE BKA     Mobility  Bed Mobility Overal bed mobility: Needs Assistance       Supine to sit: Max assist, +2 for physical assistance, HOB elevated Sit to supine: Max assist, +2 for physical assistance        Transfers Overall transfer level: Needs assistance                 General transfer comment: session focused on R quad activation/RLE weight bearing, attempting to clear buttocks for lateral scoot education    Ambulation/Gait               General Gait Details: unable   Stairs             Wheelchair Mobility    Modified Rankin (Stroke Patients Only)       Balance Overall balance assessment: Needs assistance Sitting-balance support: Feet supported, Bilateral upper extremity supported Sitting balance-Leahy Scale: Poor Sitting balance - Comments: once positioned in midline, able to maintain with CGA-supervision. able to laterally lean all the way R/L to prop on elbow,  and then returned to midline CGA   Standing balance support: Bilateral upper extremity supported Standing balance-Leahy Scale: Zero Standing balance comment: unable to stand despite maxAx2.  attempted 3 times to clear buttocks from bed most success on 2nd attempt                            Cognition Arousal/Alertness: Awake/alert Behavior During Therapy: WFL for tasks assessed/performed Overall Cognitive Status: Impaired/Different from baseline Area of Impairment: Attention, Orientation                 Orientation Level: Time, Disoriented to, Place, Situation Current Attention Level: Focused           General Comments: alert, but varying levels of orientation noted. some tangential conversation but did recall [place] at end of session        Exercises Other Exercises Other Exercises: RLE and LLE heel slides, hip abduction/adduction, and SLR x5 AAROM-AROM as needed    General Comments        Pertinent Vitals/Pain Pain Assessment Pain Assessment: No/denies pain    Home Living                          Prior Function            PT Goals (current goals can now be found in the care plan section) Progress towards PT goals: Progressing toward goals    Frequency    Min 2X/week      PT Plan Current plan remains appropriate    Co-evaluation              AM-PAC PT "6 Clicks" Mobility   Outcome Measure  Help needed turning from your back to your side while in a flat bed without using bedrails?: A Lot Help needed moving from lying on your back to sitting on the side of a flat bed without using bedrails?: A Lot Help needed moving to and from a bed to a chair (including a wheelchair)?: Total Help needed standing up from a chair using your arms (e.g., wheelchair or bedside chair)?: Total Help needed to walk in hospital room?: Total Help needed climbing 3-5 steps with a railing? : Total 6 Click Score: 8    End of Session Equipment Utilized During Treatment: Oxygen Activity Tolerance: Patient tolerated treatment well Patient left: in bed;with call bell/phone within reach;with bed alarm set Nurse Communication: Mobility  status PT Visit Diagnosis: Difficulty in walking, not elsewhere classified (R26.2);Other abnormalities of gait and mobility (R26.89)     Time: 5697-9480 PT Time Calculation (min) (ACUTE ONLY): 24 min  Charges:  $Therapeutic Exercise: 8-22 mins $Therapeutic Activity: 8-22 mins                     Lieutenant Diego PT, DPT 3:33 PM,10/17/21

## 2021-10-17 NOTE — Plan of Care (Signed)

## 2021-10-17 NOTE — Progress Notes (Signed)
Progress Note   Patient: [name]. BJS:[id number] DOB: Oct 03, 1955 DOA: 09/28/2021     19 DOS: the patient was seen and examined on 10/17/2021   Brief hospital course: Alec Primo. is a 66 y.o. male with medical history significant for chronic diastolic dysfunction CHF with LVEF of 50 to 55%, hypertension, coronary artery disease, depression, COPD, A-fib, morbid obesity (BMI 49), obstructive sleep apnea, status post recent wound debridement and partial calcanectomy (05/16) currently on oral antibiotic therapy after completion of IV who presents to the ER for evaluation of bilateral lower extremity swelling as well as increased redness and swelling involving the left lower extremity mostly over the heel. MRI of the foot showed left foot osteomyelitis, placed on vancomycin and Zosyn. Blood culture positive for MRSA secondary to osteomyelitis.   Left BKA performed on 6/6.  Due to volume overload due to acute on chronic diastolic congestive heart failure with secondary hypoxic respiratory failure.  Started IV Lasix 6/6 through 6/17.  Had been transitioned to oral torsemide, however due to persistent hypoxia and worsening chest x-ray with new pleural effusion and pulmonary edema, resumed on IV Lasix at 60 mg twice daily (p.m. on 6/18).  Currently patient on daptomycin as per ID to treat MRSA sepsis and will need to be on for a total of 4 weeks.  Repeat blood cultures negative.  Acute delirium during the entire hospitalization.  Mental status contniues to wax and wane.  Answering questions appropriately.  Continue Seroquel at night and BiPAP at night.  Foley catheter placed on 6/11 secondary to urinary retention.  Flomax increased to twice a day dosing and added Proscar.  Foley removed for voiding trial on 6/16, retention appears resolved.  Voiding well without significant post-void residuals per nursing.    Assessment and Plan: * Sepsis due to methicillin resistant Staphylococcus  aureus (MRSA) ([place]) MRSA growing out of blood cultures on 09/28/2021.  Repeat blood cultures negative.  Currently on daptomycin.  Source of infection likely osteomyelitis.  Will need 4 weeks total of antibiotics total.  Acute delirium Mental status has waxed and waned through the hospital course.  Patient answers questions appropriately.  Not complaining of any pain and has not been taking pain medications.  Per Dr. Leslye Peer, patient's wife hesitant about stopping Mirapex and gabapentin at this point.  Stressed to nursing staff of the importance of BiPAP to prevent CO2 retention.  Continue decreased dose of Seroquel down to 25 mg at night.  CT scan of the head negative on 10/06/2021. Delirium precautions  Acute on chronic diastolic CHF (congestive heart failure) ([place]) Patient's last known LVEF is 55 to 60% from a 2D echocardiogram which was done 01/23.  Net IO Since Admission: -29,898.44 mL [10/17/21 1315] (inaccurate charted input) Output yesterday: 3200 Weight steadily declining. Is getting some contraction alkalosis. -- [facility] Cardiology following --Transitioned to p.o. torsemide 20 mg twice daily but resumed on IV diuresis 6/18 due to persistent hypoxia, chest x-ray with significant pulmonary vascular congestion and new pleural effusion --Resumed Lasix IV at 60 mg twice daily 6/18.   --Continue metoprolol. --Wean Wood as able --I/O's, daily weights    Osteomyelitis ([place]) Status post left BKA on 10/03/2021. Patient on IV daptomycin. Antibiotics per ID    Acute urinary retention Foley catheter placed on 10/08/2021.  Foley DC'd on 6/16 for voiding trial.  Patient reportedly voiding well with insignificant residuals seen on bladder scans.  Continue to monitor closely.  Uncontrolled type 2 diabetes mellitus with hyperglycemia,  with long-term current use of insulin (HCC) Uncontrolled.  Last hemoglobin A1c in the chart of 10.2.  Continue Semglee insulin to 21 units twice a day.  Continue short acting  insulin prior to meals plus sliding scale.  Acute on chronic respiratory failure with hypoxia and hypercapnia (HCC) Requires BiPAP at night in order to to prevent CO2 narcosis and encephalopathy.   Last PCO2 64 -- qualifies for NIV at rehab.  6/17: Chest x-ray with pulmonary edema (infection not excluded), new right possibly loculated pleural effusion, trace left pleural effusion. -- Afebrile and on antibiotics for MRSA bacteremia and osteomyelitis -- Continue IV diuresis -- Unlikely pneumonia with no focal opacities and negative Pro-Cal, no fevers or leukocytosis   AF (paroxysmal atrial fibrillation) (HCC) Continue amiodarone.  Eliquis for anticoagulation.   Due to urinary retention, decreased Cardizem CD down to 240 mg daily, increased metoprolol 150 mg twice daily.    6/15: remains in A-flutter with HR's in 100's  Obesity, Class III, BMI 40-49.9 (morbid obesity) (HCC) BMI 77.41 Complicates overall care and prognosis.  Recommend lifestyle modifications including physical activity and diet for weight loss and overall long-term health.   Essential hypertension, benign Continue metoprolol, Cardizem and Lasix  RLS (restless legs syndrome) Continue pramipexole and gabapentin  Hyperkalemia Resolved with Lokelma on 6/17, 6/19. Monitor BMP.  Anemia Hemoglobin up to 9.3 today.  Continue to monitor closely being on blood thinners.    OSA (obstructive sleep apnea) Previously using CPAP at night. Transition to BiPAP secondary to CO2 retention.  Qualifies for NIV with elevated PCO2 on ABG.  Acute respiratory failure with hypoxia and hypercapnia (HCC) See above  Acute metabolic encephalopathy See acute delirium  Generalized weakness PT and OT recommending SNF. Fall precautions Continue PT OT here Out of bed to chair, mobilize as tolerated  Malnutrition of moderate degree Related to acute illness (left heel osteomyelitis) and energy intake of Wood than 75% for more than 7  days, mild fat depletion. -- Appreciate recommendations from dietitian -- Started on Glucerna and ensures, vitamins        Subjective: Patient continues to have waxing and waning confusion and disorientation but no behavioral issues or agitation.  Today he reports feeling okay, continues to complain of sore bottom.  No other acute complaints and denies chest pain or shortness of breath.  Physical Exam: Vitals:   10/17/21 0500 10/17/21 0505 10/17/21 0809 10/17/21 1224  BP:  119/82 131/81 96/74  Pulse:  (!) 108 (!) 109 (!) 110  Resp:  '20 20 20  '$ Temp:  98.8 F (37.1 C) 97.7 F (36.5 C) 98.3 F (36.8 C)  TempSrc:      SpO2:  94% 98% 96%  Weight: (!) 162.3 kg     Height:       General exam: Awake resting in bed, no acute distress, morbidly obese Respiratory system: Diminished bases with faint crackles throughout, normal respiratory effort at rest on 4 L/min [name] Cardiovascular system: Tachycardic, unable to visualize JVD due to body habitus, lower extremity edema appears nearly resolved Gastrointestinal system: Soft nontender nondistended Central nervous system: Normal speech, grossly nonfocal exam, oriented to self and hospital Extremities: Left BKA, right lower extremity with clean dry intact gauze dressing, severe left lower extremity venous stasis, erythema now essentially resolved Psychiatry: normal mood, congruent affect   Data Reviewed: Notable labs: Stable renal function.  Chloride 92.  Bicarb 44, BUN 32 calcium 8.7  Repeat chest x-ray this morning by my review appears essentially unchanged  from prior with persistent pulmonary vascular congestion,, persistent small right pleural effusion, right mid to lower lung opacification favoring atelectasis, minimal left basilar atelectasis.   Family Communication: None present on rounds today will attempt to call wife  Disposition: Status is: Inpatient Remains inpatient appropriate because: Remains on IV antibiotics, IV  diuretics and requires SNF placement for rehab   Planned Discharge Destination: Skilled nursing facility    Time spent: 35 minutes  Author: Ezekiel Slocumb, DO 10/17/2021 1:18 PM  For on call review [email or web address].

## 2021-10-17 NOTE — Progress Notes (Signed)
[name]  Patient ID: [name]. MRN: [id number] DOB/AGE: [birth year] 66 y.o.   Admit date: 09/28/2021 Referring Physician Dr. Francine Graven Primary Physician Dr. Felipa Furnace Primary Cardiologist Dr. Donnelly Angelica Reason for Consultation acute on chronic HFpEF    HPI: [name]. is a [id number] with a PMH significant for CAD s/p CABG 2006 and PCI of RCA 01/2020, HFpEF (LVEF 50-55% 04/2021), A. fib / flutter on apixaban, amiodarone and digoxin, HTN, BPH, morbid obesity, poorly controlled type 2 diabetes,  PAD s/p left third toe amputation, chronic venous stasis, OSA on CPAP who presented to [facility] ED on 09/28/21 with increasing pain and swelling in his left leg, and imaging concerning for worsening osteomyelitis of his left foot. He has has onoging infections needing wound debridement in his left lower leg over the past several months. Cardiology is consulted for assistance with his heart failure.   Interval History:  -no acute events -feels a little down today. No shortness of breath.  -remains with intermittent memory issues. No chest pain, dizziness.   Vitals:   10/16/21 2340 10/17/21 0500 10/17/21 0505 10/17/21 0809  BP: 120/85  119/82 131/81  Pulse: (!) 109  (!) 108 (!) 109  Resp: '20  20 20  '$ Temp: 98.8 F (37.1 C)  98.8 F (37.1 C) 97.7 F (36.5 C)  TempSrc:      SpO2: 97%  94% 98%  Weight:  (!) 162.3 kg    Height:         Intake/Output Summary (Last 24 hours) at 10/17/2021 1042 Last data filed at 10/17/2021 0500 Gross per 24 hour  Intake --  Output 2375 ml  Net -2375 ml    PHYSICAL EXAM General: Ill-appearing Caucasian male,  in no acute distress.  Sitting upright in hospital bed without family at bedside.   HEENT:  Normocephalic and atraumatic. Neck:   No JVD.  Lungs: Normal respiratory effort on oxygen by [place]. Trace crackles R base.  Heart: Tachy but regular. Normal S1 and S2 without gallops or murmurs.  Abdomen: Obese appearing.  Msk: Normal  strength and tone for age. Extremities:  RLE with venous stasis changes and woody appearance to skin,  Left leg BKA with dry padded dressing, lateral part of wound visible with well approximated wound edges with stables present, no purulence or erythema.  Both legs are much softer and Wood edematous than on presentation Neuro: Alert and oriented x3 Psych: answers questions appropriately    LABS: Basic Metabolic Panel: Recent Labs    10/16/21 0651 10/17/21 0625  NA 139 142  K 5.2* 4.6  CL 90* 92*  CO2 42* 44*  GLUCOSE 179* 92  BUN 34* 32*  CREATININE 0.86 0.77  CALCIUM 8.5* 8.7*  MG 2.1  --     Liver Function Tests: No results for input(s): "AST", "ALT", "ALKPHOS", "BILITOT", "PROT", "ALBUMIN" in the last 72 hours.  No results for input(s): "LIPASE", "AMYLASE" in the last 72 hours. CBC: Recent Labs    10/15/21 0548  WBC 9.1  NEUTROABS 6.3  HGB 10.3*  HCT 35.8*  MCV 95.5  PLT 264    Cardiac Enzymes: Recent Labs    10/16/21 0651  CKTOTAL 16*    BNP: Invalid input(s): "POCBNP" D-Dimer: No results for input(s): "DDIMER" in the last 72 hours. Hemoglobin A1C: No results for input(s): "HGBA1C" in the last 72 hours. Fasting Lipid Panel: No results for input(s): "CHOL", "HDL", "LDLCALC", "TRIG", "CHOLHDL", "LDLDIRECT" in the last 72 hours. Thyroid Function  Tests: No results for input(s): "TSH", "T4TOTAL", "T3FREE", "THYROIDAB" in the last 72 hours.  Invalid input(s): "FREET3" Anemia Panel: No results for input(s): "VITAMINB12", "FOLATE", "FERRITIN", "TIBC", "IRON", "RETICCTPCT" in the last 72 hours.  No results found.   Echo LVEF 50-55% 05/10/2021  TELEMETRY: Atrial fibrillation 111 bpm:  ASSESSMENT AND PLAN:  Principal Problem:   Sepsis due to methicillin resistant Staphylococcus aureus (MRSA) (HCC) Active Problems:   Essential hypertension, benign   RLS (restless legs syndrome)   Osteomyelitis (HCC)   Obesity, Class III, BMI 40-49.9 (morbid obesity)  (HCC)   Acute on chronic diastolic CHF (congestive heart failure) (HCC)   AF (paroxysmal atrial fibrillation) ([place])   Uncontrolled type 2 diabetes mellitus with hyperglycemia, with long-term current use of insulin (HCC)   Malnutrition of moderate degree   Generalized weakness   Acute metabolic encephalopathy   Acute respiratory failure with hypoxia and hypercapnia (HCC)   OSA (obstructive sleep apnea)   Acute delirium   Acute on chronic respiratory failure with hypoxia and hypercapnia (HCC)   Anemia   Acute urinary retention   Hyperkalemia    1. Acute on chronic diastolic congestive heart failure, HFpEF, LVEF 50-55% 05/10/2021, good diuresis after IV furosemide but remains on supplemental O2 2.  Paroxysmal atrial fibrillation/atrial flutter, currently in atrial flutter heart rate 109 bpm, on amiodarone and metoprolol for rate control and Eliquis for stroke prevention, cardizem '180mg'$  CD 3.  Coronary artery disease, status post CABG 2006, PCI RCA 01/2020, currently without chest pain 4.  Left foot osteomyelitis on IV vancomycin, left BKA 6/6 with Dr. Delana Meyer 5.  Acute kidney injury, improving to Cr 1.03 and >60  6.  MRSA bacteremia, osteomyelitis of L foot likely source, on IV daptomycin, repeat echo ordered by ID, resulted with thickened aortic and mitral leaflets without obvious vegetation. Will need 4 weeks of Abx, per ID 7. Urinary retention, improved after increasing flomax to BID and adding proscar, currently with Foley catheter, planning voiding trial before discharge   Recommendations   1.  Agree with current therapy 2.  s/p IV lasix '40mg'$  IV BID, 6/17 started on torsemide '20mg'$  PO BID x 3 doses  - although he as diuresed ~18L since admission,  repeat CXR on 6/17 showed  pleural effusions & he remains on supplemental oxygen, was restarted on  lasix '60mg'$  IV BID on 6/18.  3.  Eliquis '5mg'$  BID restarted 6/9 for stroke risk reduction  4.  Continue amiodarone '200mg'$  once daily, metoprolol  tartrate '100mg'$  BID, and cut back cardizem CD from '240mg'$  to '180mg'$  for rate and rhythm control with softer Bps  -wean midodrine as able, prefer decreasing antihypertensives 5.  S/p Left BKA 6/6 6.  Pending rehab bed placement   This patient's plan of care was discussed and created with Dr. Corky Sox and he is in agreement.    Tristan Schroeder, PA-C 10/17/2021 10:42 AM

## 2021-10-17 NOTE — TOC Progression Note (Signed)
Transition of Care (TOC) - Progression Note    Patient Details  Name: [name]. MRN: [id number] Date of Birth: 06-06-1955  Transition of Care [facility]) CM/SW Contact  Laurena Slimmer, RN Phone Number: 10/17/2021, 3:43 PM  Clinical Narrative:    Spoke with Tammy. Clinical in review.     Expected Discharge Plan: [place] Barriers to Discharge: Continued Medical Work up  Expected Discharge Plan and Services Expected Discharge Plan: [place] In-house Referral: NA   Post Acute Care Choice: Resumption of Svcs/PTA Provider Living arrangements for the past 2 months: Single Family Home                                       Social Determinants of Health (SDOH) Interventions    Readmission Risk Interventions    09/29/2021   11:12 AM  Readmission Risk Prevention Plan  Transportation Screening Complete  PCP or Specialist Appt within 3-5 Days Complete  HRI or [place] Complete  Social Work Consult for [place] Planning/Counseling Complete  Palliative Care Screening Not Applicable  Medication Review [employer]) Complete

## 2021-10-18 DIAGNOSIS — I5033 Acute on chronic diastolic (congestive) heart failure: Secondary | ICD-10-CM | POA: Diagnosis not present

## 2021-10-18 DIAGNOSIS — G9340 Encephalopathy, unspecified: Secondary | ICD-10-CM

## 2021-10-18 DIAGNOSIS — J9621 Acute and chronic respiratory failure with hypoxia: Secondary | ICD-10-CM | POA: Diagnosis not present

## 2021-10-18 DIAGNOSIS — A4102 Sepsis due to Methicillin resistant Staphylococcus aureus: Secondary | ICD-10-CM | POA: Diagnosis not present

## 2021-10-18 DIAGNOSIS — R410 Disorientation, unspecified: Secondary | ICD-10-CM | POA: Diagnosis not present

## 2021-10-18 DIAGNOSIS — Z89519 Acquired absence of unspecified leg below knee: Secondary | ICD-10-CM

## 2021-10-18 LAB — BASIC METABOLIC PANEL
Anion gap: 7 (ref 5–15)
BUN: 29 mg/dL — ABNORMAL HIGH (ref 8–23)
CO2: 45 mmol/L — ABNORMAL HIGH (ref 22–32)
Calcium: 8.7 mg/dL — ABNORMAL LOW (ref 8.9–10.3)
Chloride: 89 mmol/L — ABNORMAL LOW (ref 98–111)
Creatinine, Ser: 0.81 mg/dL (ref 0.61–1.24)
GFR, Estimated: >60 mL/min (ref >60)
Glucose, Bld: 91 mg/dL (ref 70–99)
Potassium: 4.6 mmol/L (ref 3.5–5.1)
Sodium: 141 mmol/L (ref 135–145)

## 2021-10-18 LAB — BLOOD GAS, VENOUS
Acid-Base Excess: 22.2 mmol/L — ABNORMAL HIGH (ref 0.0–2.0)
Bicarbonate: 53.6 mmol/L — ABNORMAL HIGH (ref 20.0–28.0)
O2 Content: 2 L/min
O2 Saturation: 62.5 %
Patient temperature: 37
pCO2, Ven: 97 mmHg (ref 44–60)
pH, Ven: 7.35 (ref 7.25–7.43)
pO2, Ven: 40 mmHg (ref 32–45)

## 2021-10-18 LAB — FERRITIN: Ferritin: 22 ng/mL — ABNORMAL LOW (ref 24–336)

## 2021-10-18 LAB — GLUCOSE, CAPILLARY
Glucose-Capillary: 89 mg/dL (ref 70–99)
Glucose-Capillary: 150 mg/dL — ABNORMAL HIGH (ref 70–99)
Glucose-Capillary: 252 mg/dL — ABNORMAL HIGH (ref 70–99)
Glucose-Capillary: 237 mg/dL — ABNORMAL HIGH (ref 70–99)

## 2021-10-18 MED ORDER — DILTIAZEM HCL ER COATED BEADS 120 MG PO CP24
120.0000 mg | ORAL_CAPSULE | Freq: Every day | ORAL | Status: DC
Start: 2021-10-19 — End: 2021-10-25
  Administered 2021-10-19 – 2021-10-24 (×6): 120 mg via ORAL
  Filled 2021-10-18 (×7): qty 1

## 2021-10-18 NOTE — Progress Notes (Signed)
ID Pt doing much better More alert and wake Says he is feeling better   BP 104/75 (BP Location: Right Arm)   Pulse (!) 110   Temp 98.7 F (37.1 C)   Resp 16   Ht '6\' 4"'$  (1.93 m)   Wt (!) 161.1 kg   SpO2 91%   BMI 43.23 kg/m   Chest b/l air entry Hs - tachycardia Left BKA- stump clean, sutures approximated well   Labs    Latest Ref Rng & Units 10/15/2021    5:48 AM 10/11/2021    6:37 AM 10/10/2021    4:36 AM  CBC  WBC 4.0 - 10.5 K/uL 9.1  10.1  7.1   Hemoglobin 13.0 - 17.0 g/dL 10.3  9.3  8.9   Hematocrit 39.0 - 52.0 % 35.8  32.1  31.5   Platelets 150 - 400 K/uL 264  342  315        Latest Ref Rng & Units 10/18/2021    5:25 AM 10/17/2021    6:25 AM 10/16/2021    6:51 AM  CMP  Glucose 70 - 99 mg/dL 91  92  179   BUN 8 - 23 mg/dL 29  32  34   Creatinine 0.61 - 1.24 mg/dL 0.81  0.77  0.86   Sodium 135 - 145 mmol/L 141  142  139   Potassium 3.5 - 5.1 mmol/L 4.6  4.6  5.2   Chloride 98 - 111 mmol/L 89  92  90   CO2 22 - 32 mmol/L 45  44  42   Calcium 8.9 - 10.3 mg/dL 8.7  8.7  8.5       Impression/recommendation MRSA bacteremia secondary to left heel ulcer with underlying osteomyelitis. Low bioburden 1 of 4 Repeat blood culture neg MRSA in wound culture positive Patient is on  Daptomycin S/p BKA 2 d echo no vegetation- Mitral valve is thickened,no vegetation- unable to do TEE because  of his tenuous resp  Plan is to give daptomycin for  4 weeks until  11/02/21    Encephalopathy.  Multifactorial--predominantly due to CO2 narcosis/ . On intermittent BIPAP. Has improved a lot  Severe venous edema of both legs  AKI resolved  Paroxysmal A-fib with atrial flutter.  On amiodarone metoprolol and Eliquis  Coronary artery disease status post CABG  Diastolic CHF  on furosemide  Discussed the management with the patient and care team ID will sign off- call if needed

## 2021-10-18 NOTE — Plan of Care (Signed)
  Problem: Clinical Measurements: Goal: Ability to maintain clinical measurements within normal limits will improve Outcome: Progressing   Problem: Clinical Measurements: Goal: Will remain free from infection Outcome: Progressing   Problem: Clinical Measurements: Goal: Respiratory complications will improve Outcome: Progressing   Problem: Clinical Measurements: Goal: Cardiovascular complication will be avoided Outcome: Progressing   Problem: Pain Managment: Goal: General experience of comfort will improve Outcome: Progressing   Problem: Safety: Goal: Ability to remain free from injury will improve Outcome: Progressing   

## 2021-10-18 NOTE — Progress Notes (Signed)
[facility] Cardiology  Patient ID: [name]. MRN: [id number] DOB/AGE: [birth year] 66 y.o.   Admit date: 09/28/2021 Referring Physician Dr. Francine Graven Primary Physician Dr. Felipa Furnace Primary Cardiologist Dr. Donnelly Angelica Reason for Consultation acute on chronic HFpEF    HPI: [name]. is a [phone] with a PMH significant for CAD s/p CABG 2006 and PCI of RCA 01/2020, HFpEF (LVEF 50-55% 04/2021), A. fib / flutter on apixaban, amiodarone and digoxin, HTN, BPH, morbid obesity, poorly controlled type 2 diabetes,  PAD s/p left third toe amputation, chronic venous stasis, OSA on CPAP who presented to [facility] ED on 09/28/21 with increasing pain and swelling in his left leg, and imaging concerning for worsening osteomyelitis of his left foot. He has has onoging infections needing wound debridement in his left lower leg over the past several months. Cardiology is consulted for assistance with his heart failure.   Interval History:  -No acute events, still pending rehab bed placement -No chest pain, palpitations, no shortness of breath, remains on 4 L by nasal cannula -Decreasing diltiazem dose further for tomorrow in hopes to wean off midodrine -Continues to diurese well on IV Lasix   Vitals:   10/17/21 2000 10/18/21 0408 10/18/21 0409 10/18/21 0745  BP:   108/84 117/78  Pulse:   (!) 109 (!) 105  Resp:   20 14  Temp:   98.8 F (37.1 C) 99.2 F (37.3 C)  TempSrc:      SpO2: 92%  97% 92%  Weight:  (!) 161.1 kg    Height:         Intake/Output Summary (Last 24 hours) at 10/18/2021 1119 Last data filed at 10/18/2021 0407 Gross per 24 hour  Intake --  Output 1500 ml  Net -1500 ml    PHYSICAL EXAM General: Ill-appearing Caucasian male,  in no acute distress.  Sitting upright in hospital bed without family at bedside.   HEENT:  Normocephalic and atraumatic. Neck:   No JVD.  Lungs: Normal respiratory effort on oxygen by [place].  Clear to auscultation bilaterally Heart: Tachy but  regular. Normal S1 and S2 without gallops or murmurs.  Abdomen: Obese appearing.  Msk: Normal strength and tone for age. Extremities:  RLE with venous stasis changes and woody appearance to skin,  Left leg BKA with dry padded dressing, lateral part of wound visible with well approximated wound edges with stables present, no purulence or erythema.  Both legs are much softer and Wood edematous than on presentation Neuro: Alert and oriented x3 Psych: answers questions appropriately    LABS: Basic Metabolic Panel: Recent Labs    10/16/21 0651 10/17/21 0625 10/18/21 0525  NA 139 142 141  K 5.2* 4.6 4.6  CL 90* 92* 89*  CO2 42* 44* 45*  GLUCOSE 179* 92 91  BUN 34* 32* 29*  CREATININE 0.86 0.77 0.81  CALCIUM 8.5* 8.7* 8.7*  MG 2.1  --   --     Liver Function Tests: No results for input(s): "AST", "ALT", "ALKPHOS", "BILITOT", "PROT", "ALBUMIN" in the last 72 hours.  No results for input(s): "LIPASE", "AMYLASE" in the last 72 hours. CBC: No results for input(s): "WBC", "NEUTROABS", "HGB", "HCT", "MCV", "PLT" in the last 72 hours.  Cardiac Enzymes: Recent Labs    10/16/21 0651  CKTOTAL 16*    BNP: Invalid input(s): "POCBNP" D-Dimer: No results for input(s): "DDIMER" in the last 72 hours. Hemoglobin A1C: No results for input(s): "HGBA1C" in the last 72 hours. Fasting Lipid Panel:  No results for input(s): "CHOL", "HDL", "LDLCALC", "TRIG", "CHOLHDL", "LDLDIRECT" in the last 72 hours. Thyroid Function Tests: No results for input(s): "TSH", "T4TOTAL", "T3FREE", "THYROIDAB" in the last 72 hours.  Invalid input(s): "FREET3" Anemia Panel: No results for input(s): "VITAMINB12", "FOLATE", "FERRITIN", "TIBC", "IRON", "RETICCTPCT" in the last 72 hours.  DG Chest Port 1 View  Result Date: 10/17/2021 CLINICAL DATA:  Provided history: Hypoxia. EXAM: PORTABLE CHEST 1 VIEW COMPARISON:  Provided history: Prior chest radiographs 10/14/2021 and earlier. FINDINGS: Prior median  sternotomy/CABG. Cardiomegaly. Central pulmonary vascular congestion. Persistent small right pleural effusion. Additional ill-defined opacity within the right mid to lower lung field has an appearance favoring atelectasis. Minimal atelectasis within the left lung base. No evidence of pneumothorax. Degenerative changes of the spine. IMPRESSION: Cardiomegaly with central pulmonary vascular congestion. Persistent small right pleural effusion. Additional ill-defined opacity within the right mid to lower lung field has an appearance favoring atelectasis. However, superimposed pneumonia cannot be excluded and clinical correlation is recommended. Minimal atelectasis within the left lung base. Electronically Signed   By: Kellie Simmering D.O.   On: 10/17/2021 10:35     Echo LVEF 50-55% 05/10/2021  TELEMETRY: Atrial fibrillation 111 bpm:  ASSESSMENT AND PLAN:  Principal Problem:   Sepsis due to methicillin resistant Staphylococcus aureus (MRSA) (HCC) Active Problems:   Essential hypertension, benign   RLS (restless legs syndrome)   Osteomyelitis (HCC)   Obesity, Class III, BMI 40-49.9 (morbid obesity) (HCC)   Acute on chronic diastolic CHF (congestive heart failure) (HCC)   AF (paroxysmal atrial fibrillation) ([place])   Uncontrolled type 2 diabetes mellitus with hyperglycemia, with long-term current use of insulin (HCC)   Malnutrition of moderate degree   Generalized weakness   Acute metabolic encephalopathy   Acute respiratory failure with hypoxia and hypercapnia (HCC)   OSA (obstructive sleep apnea)   Acute delirium   Acute on chronic respiratory failure with hypoxia and hypercapnia (HCC)   Anemia   Acute urinary retention   Hyperkalemia    1. Acute on chronic diastolic congestive heart failure, HFpEF, LVEF 50-55% 05/10/2021, good diuresis after IV furosemide but remains on supplemental O2 2.  Paroxysmal atrial fibrillation/atrial flutter, currently in atrial flutter heart rate 109 bpm, on amiodarone  and metoprolol for rate control and Eliquis for stroke prevention, cardizem '180mg'$  CD 3.  Coronary artery disease, status post CABG 2006, PCI RCA 01/2020, currently without chest pain 4.  Left foot osteomyelitis on IV vancomycin, left BKA 6/6 with Dr. Delana Meyer 5.  Acute kidney injury, improving to Cr 1.03 and >60  6.  MRSA bacteremia, osteomyelitis of L foot likely source, on IV daptomycin, repeat echo ordered by ID, resulted with thickened aortic and mitral leaflets without obvious vegetation. Will need 4 weeks of Abx, per ID 7. Urinary retention, improved after increasing flomax to BID and adding proscar, currently with Foley catheter, planning voiding trial before discharge   Recommendations   1.  Agree with current therapy 2.  s/p IV lasix '40mg'$  IV BID, 6/17 started on torsemide '20mg'$  PO BID x 3 doses  - although he as diuresed ~18L since admission (weight down from 402lbs to 355),  repeat CXR on 6/17 showed  pleural effusions & he remains on supplemental oxygen, was restarted on lasix '60mg'$  IV BID on 6/18.  -  3.  Eliquis '5mg'$  BID restarted 6/9 for stroke risk reduction  4.  Continue amiodarone '200mg'$  once daily, metoprolol tartrate '100mg'$  BID, and cut back cardizem CD from '180mg'$  to 120 mg for  rate and rhythm control with softer Bps  -wean midodrine as able, prefer decreasing antihypertensives 5.  S/p Left BKA 6/6 6.  Pending rehab bed placement   This patient's plan of care was discussed and created with Dr. Corky Sox and he is in agreement.    Tristan Schroeder, PA-C 10/18/2021 11:19 AM

## 2021-10-18 NOTE — Progress Notes (Signed)
Patient taken off of Bipap for physical therapy to attempt to work with patient.

## 2021-10-18 NOTE — Progress Notes (Signed)
Physical Therapy Treatment Patient Details Name: [name]. MRN: [id number] DOB: 1955/11/24 Today's Date: 10/18/2021   History of Present Illness [name] is a 61yoM who comes to [facility] on 09/28/21 c progression of Left chornic heel ulcer. MRI showed significant progressive osteomyelitis in the left calcaneus. PMH HTN, IDDM2, morbid obesity, depression, CAD s/p CABG 2006 and PCI to RCA in 12/2020, chronic B venous stasis on [place] boots, chronic L foot ulcer, COPD, OSA on CPAP nightly, ai-fib, PAD, s/p multiple toe amputations.  S/p L BKA on 10/03/21.    PT Comments    Pt was pleasant and motivated to participate during the session and put forth good effort throughout. Pt somewhat confused with tangential speech but easily redirected and followed simple commands with extra time and cuing.  Pt presented with significant deficits in functional strength and required heavy +2 assist with bed mobility tasks.  Once in sitting pt presented with poor stability initially but static sitting balance improved once pt was assisted into full upright sitting position.  Multiple attempts made to come to standing from elevated EOB with heavy +2 assist and with pt leaning forward and holding onto the sink counter but pt was unable to clear the surface of the bed.  Pt's SpO2 and HR remained WNL on 4LO2/min with no adverse symptoms noted.  Pt will benefit from PT services in a SNF setting upon discharge to safely address deficits listed in patient problem list for decreased caregiver assistance and eventual return to PLOF.     Recommendations for follow up therapy are one component of a multi-disciplinary discharge planning process, led by the attending physician.  Recommendations may be updated based on patient status, additional functional criteria and insurance authorization.  Follow Up Recommendations  Skilled nursing-short term rehab (<3 hours/day) Can patient physically be transported by private vehicle:  No   Assistance Recommended at Discharge Frequent or constant Supervision/Assistance  Patient can return home with the following Two people to help with bathing/dressing/bathroom;Two people to help with walking and/or transfers;Assistance with cooking/housework;Assist for transportation;Help with stairs or ramp for entrance;Direct supervision/assist for medications management;Direct supervision/assist for financial management   Equipment Recommendations  Other (comment) (TBD at next venue of care)    Recommendations for Other Services       Precautions / Restrictions Precautions Precautions: Fall Restrictions Weight Bearing Restrictions: No Other Position/Activity Restrictions: LLE BKA 10/03/21     Mobility  Bed Mobility Overal bed mobility: Needs Assistance Bed Mobility: Rolling Rolling: Max assist, +2 for physical assistance   Supine to sit: Max assist, +2 for physical assistance, HOB elevated Sit to supine: Max assist, +2 for physical assistance   General bed mobility comments: +2 Max A for BLE and trunk control    Transfers                   General transfer comment: Sit to stand attempts x 5 with +2 Max assist and pt holding sink counter but unable to clear surface of bed    Ambulation/Gait               General Gait Details: unable   Stairs             Wheelchair Mobility    Modified Rankin (Stroke Patients Only)       Balance Overall balance assessment: Needs assistance   Sitting balance-Leahy Scale: Poor Sitting balance - Comments: Once positioned in midline with heavy assist pt able to maintain static sitting with  CGA-supervision       Standing balance comment: Unable to stand                            Cognition Arousal/Alertness: Awake/alert Behavior During Therapy: WFL for tasks assessed/performed Overall Cognitive Status: Impaired/Different from baseline Area of Impairment: Attention, Orientation                                General Comments: Alert and followed simple commands with extra time and cuing but mildly confused with tangential conversation        Exercises Total Joint Exercises Quad Sets: AROM, Strengthening, 10 reps, Left, 5 reps Knee Flexion: AROM, Strengthening, Left, 5 reps, 10 reps Other Exercises: Seated core therex with weight shifting in various planes at the EOB Other Exercises: Positioning education with pt and spouse to promote L knee ext PROM Other Exercises: HEP education with pt and spouse for general supine exercises and seated knee flex    General Comments        Pertinent Vitals/Pain Pain Assessment Pain Assessment: No/denies pain    Home Living                          Prior Function            PT Goals (current goals can now be found in the care plan section) Progress towards PT goals: Not progressing toward goals - comment (limited by significant functional weakness)    Frequency    Min 2X/week      PT Plan Current plan remains appropriate    Co-evaluation              AM-PAC PT "6 Clicks" Mobility   Outcome Measure  Help needed turning from your back to your side while in a flat bed without using bedrails?: A Lot Help needed moving from lying on your back to sitting on the side of a flat bed without using bedrails?: A Lot Help needed moving to and from a bed to a chair (including a wheelchair)?: Total Help needed standing up from a chair using your arms (e.g., wheelchair or bedside chair)?: Total Help needed to walk in hospital room?: Total Help needed climbing 3-5 steps with a railing? : Total 6 Click Score: 8    End of Session Equipment Utilized During Treatment: Oxygen Activity Tolerance: Patient tolerated treatment well Patient left: in bed;with call bell/phone within reach;with bed alarm set;with family/visitor present Nurse Communication: Mobility status PT Visit Diagnosis: Difficulty in  walking, not elsewhere classified (R26.2);Other abnormalities of gait and mobility (R26.89)     Time: 9702-6378 PT Time Calculation (min) (ACUTE ONLY): 33 min  Charges:  $Therapeutic Exercise: 8-22 mins $Therapeutic Activity: 8-22 mins                    D. Scott Lennyn Bellanca PT, DPT 10/18/21, 5:28 PM

## 2021-10-18 NOTE — Progress Notes (Signed)
Nutrition Follow-up  DOCUMENTATION CODES:   Morbid obesity, Non-severe (moderate) malnutrition in context of acute illness/injury  INTERVENTION:   -Continue Ensure Max po daily, each supplement provides 150 kcal and 30 grams of protein -Continue MVI with minerals daily -Continue 500 mg vitamin C BID -Continue double protein portions with meals -Continue feeding assistance with meals -Continue HS snack daily  NUTRITION DIAGNOSIS:   Moderate Malnutrition related to acute illness (L heel osteomyelitis) as evidenced by energy intake < 75% for > 7 days, mild fat depletion.  Ongoing  GOAL:   Patient will meet greater than or equal to 90% of their needs  Progressing   MONITOR:   PO intake, Supplement acceptance, Labs, Weight trends, I & O's  REASON FOR ASSESSMENT:   Consult Wound healing  ASSESSMENT:   Pt admitted with wound infection. Found to have osteomyelitis of L heel. PMH significant for uncontrolled N4BS, chronic diastolic dysfunction CHF, HTN, CAD, COPD, afib, morbid obesity, OSA, s/p recent wound debridement and partial calcanectomy (05/16).  6/6- s/p lt BKA  Reviewed I/O's: -1.5 L x 24 hours and -27.8 L since 10/04/21  UOP: 1.5 L x 24 hours   Pt resting quietly at time of visit. Noted [employer] bag on tray table.   Pt remains with good appetite. Noted meal completions 75-100%.   Medications reviewed and include vitamin C, vitamin D, ferrous sulfate, lasix, and vitamin B-12.   Per TOC notes, plan for SNF placement once medically stable.  Labs reviewed: CBGS: 89-153 (inpatient orders for glycemic control are 0-20 units insulin aspart TID with meals, 6 units insulin aspart, and insulin glargine-yfgn BID).    Diet Order:   Diet Order             Diet Carb Modified Fluid consistency: Thin; Room service appropriate? Yes; Fluid restriction: 1800 mL Fluid  Diet effective now                   EDUCATION NEEDS:   Not appropriate for education at this  time  Skin:  Skin Assessment: Skin Integrity Issues: Skin Integrity Issues:: Other (Comment), Incisions Wound Vac: - Incisions: s/p lt BKA Other: venous statsis ulcer to rt pretibial, MASD to abdominal folds and groin  Last BM:  10/15/21  Height:   Ht Readings from Last 1 Encounters:  10/16/21 '6\' 4"'$  (1.93 m)    Weight:   Wt Readings from Last 1 Encounters:  10/18/21 (!) 161.1 kg    Ideal Body Weight:  91.8 kg  BMI:  Body mass index is 43.23 kg/m.  Estimated Nutritional Needs:   Kcal:  2100-2300  Protein:  120-140 grams  Fluid:  > 2 L    Loistine Chance, RD, LDN, [place] Registered Dietitian II Certified Diabetes Care and Education Specialist Please refer to [facility] for RD and/or RD on-call/weekend/after hours pager

## 2021-10-18 NOTE — Progress Notes (Signed)
Progress Note   Patient: [name]. BOF:[id number] DOB: [birth year] DOA: 09/28/2021     20 DOS: the patient was seen and examined on 10/18/2021    Assessment and Plan: * Sepsis due to methicillin resistant Staphylococcus aureus (MRSA) ([place]) MRSA growing out of blood cultures on 09/28/2021.  Repeat blood cultures negative.  Continue daptomycin.  Source of infection likely osteomyelitis.  Will need 4 weeks total of antibiotics total.  Acute on chronic respiratory failure with hypoxia and hypercapnia (HCC) Requires BiPAP at night in order to to prevent CO2 narcosis and encephalopathy.   Last PCO2 97 -- qualifies for NIV at rehab.  I stressed the importance to nursing staff that the patient needs BiPAP anytime he is taking a nap and at night.  The patient's wife has also okay with BiPAP in between meals.     Acute delirium Mental status has waxed and waned through the hospital course.  Patient on low-dose Seroquel at night.  He needs the BiPAP in order to blow off CO2 for mental status to be as good as possible.  Acute on chronic diastolic CHF (congestive heart failure) ([place]) Patient's last known LVEF is 55 to 60% from a 2D echocardiogram which was done 01/23.  -- [facility] following --Resumed Lasix IV at 60 mg twice daily 6/18.   --Continue metoprolol.     Osteomyelitis ([place]) Status post left BKA on 10/03/2021. Patient on IV daptomycin.    Acute urinary retention Foley catheter placed on 10/08/2021.  Foley DC'd on 6/16 for voiding trial.  Patient reportedly voiding well with insignificant residuals seen on bladder scans.  Continue to monitor closely.  Uncontrolled type 2 diabetes mellitus with hyperglycemia, with long-term current use of insulin (HCC) Uncontrolled.  Last hemoglobin A1c in the chart of 10.2.  Continue Semglee insulin to 21 units twice a day.  Continue short acting insulin prior to meals plus sliding scale.  AF (paroxysmal atrial fibrillation)  (HCC) Continue amiodarone, Cardizem CD and metoprolol Eliquis for anticoagulation.     Obesity, Class III, BMI 40-49.9 (morbid obesity) (HCC) BMI down to 77.82 Complicates overall care and prognosis.  Recommend lifestyle modifications including physical activity and diet for weight loss and overall long-term health.   Essential hypertension, benign Continue metoprolol, Cardizem and Lasix  RLS (restless legs syndrome) Continue pramipexole and gabapentin  Hyperkalemia Resolved with Lokelma on 6/17, 6/19. Monitor BMP.  Anemia Last hemoglobin 10.3  OSA (obstructive sleep apnea) Previously using CPAP at night. Transition to BiPAP secondary to CO2 retention.  Qualifies for NIV with elevated PCO2 on ABG.  Acute respiratory failure with hypoxia and hypercapnia (HCC) See above  Acute metabolic encephalopathy See acute delirium  Generalized weakness PT and OT recommending SNF. Fall precautions Continue PT OT here Out of bed to chair, mobilize as tolerated  Malnutrition of moderate degree Related to acute illness (left heel osteomyelitis) and energy intake of Wood than 75% for more than 7 days, mild fat depletion. -- Appreciate recommendations from dietitian -- Started on Glucerna and ensures, vitamins        Subjective: Patient feels okay.  Offers no complaints.  In talking to him a little bit longer he states he likes to go to the ball field.  I had to reorient him that he was in the hospital.  Physical Exam: Vitals:   10/18/21 0408 10/18/21 0409 10/18/21 0745 10/18/21 1129  BP:  108/84 117/78 104/75  Pulse:  (!) 109 (!) 105 (!) 110  Resp:  20  14 16  Temp:  98.8 F (37.1 C) 99.2 F (37.3 C) 98.7 F (37.1 C)  TempSrc:      SpO2:  97% 92% 91%  Weight: (!) 161.1 kg     Height:       Physical Exam HENT:     Head: Normocephalic.  Eyes:     General: Lids are normal.     Conjunctiva/sclera: Conjunctivae normal.  Cardiovascular:     Rate and Rhythm: Tachycardia  present. Rhythm irregularly irregular.     Heart sounds: Normal heart sounds, S1 normal and S2 normal.  Pulmonary:     Breath sounds: No decreased breath sounds, wheezing, rhonchi or rales.  Abdominal:     Palpations: Abdomen is soft.     Tenderness: There is no abdominal tenderness.  Musculoskeletal:     Right lower leg: Edema present.     Left Lower Extremity: Left leg is amputated below knee.  Skin:    General: Skin is warm.     Findings: No rash.     Comments: Chronic lower extremity skin discoloration right lower leg. Slight redness over nose where BiPAP was placed.  Neurological:     Mental Status: He is alert.     Comments: Patient answers a few questions but does not elaborate much today.     Data Reviewed: PCO2 97, creatinine 0.81, CO2 45  Family Communication: Spoke with patient's wife on the phone  Disposition: Status is: Inpatient Remains inpatient appropriate because: We do not have a good disposition plan at this point.  PCO2 on ABG more elevated today than previous.  Continue BiPAP at night and while resting.  Patient's wife okay with BiPAP in between meals also.  Planned Discharge Destination: Rehab   Author: Loletha Grayer, MD 10/18/2021 3:32 PM  For on call review [email or web address].

## 2021-10-18 NOTE — Plan of Care (Signed)
?  Problem: Health Behavior/Discharge Planning: ?Goal: Ability to manage health-related needs will improve ?Outcome: Progressing ?  ?Problem: Clinical Measurements: ?Goal: Will remain free from infection ?Outcome: Progressing ?  ?Problem: Clinical Measurements: ?Goal: Respiratory complications will improve ?Outcome: Progressing ?  ?Problem: Clinical Measurements: ?Goal: Cardiovascular complication will be avoided ?Outcome: Progressing ?  ?Problem: Pain Managment: ?Goal: General experience of comfort will improve ?Outcome: Progressing ?  ?Problem: Safety: ?Goal: Ability to remain free from injury will improve ?Outcome: Progressing ?  ?

## 2021-10-19 DIAGNOSIS — D509 Iron deficiency anemia, unspecified: Secondary | ICD-10-CM

## 2021-10-19 DIAGNOSIS — I5033 Acute on chronic diastolic (congestive) heart failure: Secondary | ICD-10-CM | POA: Diagnosis not present

## 2021-10-19 DIAGNOSIS — A4102 Sepsis due to Methicillin resistant Staphylococcus aureus: Secondary | ICD-10-CM | POA: Diagnosis not present

## 2021-10-19 DIAGNOSIS — J9621 Acute and chronic respiratory failure with hypoxia: Secondary | ICD-10-CM | POA: Diagnosis not present

## 2021-10-19 DIAGNOSIS — R410 Disorientation, unspecified: Secondary | ICD-10-CM | POA: Diagnosis not present

## 2021-10-19 LAB — BLOOD GAS, ARTERIAL
Acid-Base Excess: 22.6 mmol/L — ABNORMAL HIGH (ref 0.0–2.0)
Bicarbonate: 51.6 mmol/L — ABNORMAL HIGH (ref 20.0–28.0)
Delivery systems: POSITIVE
Expiratory PAP: 8 cmH2O
FIO2: 50 %
Inspiratory PAP: 15 cmH2O
O2 Saturation: 95.3 %
Patient temperature: 37
RATE: 14 resp/min
pCO2 arterial: 76 mmHg (ref 32–48)
pH, Arterial: 7.44 (ref 7.35–7.45)
pO2, Arterial: 70 mmHg — ABNORMAL LOW (ref 83–108)

## 2021-10-19 LAB — GLUCOSE, CAPILLARY
Glucose-Capillary: 131 mg/dL — ABNORMAL HIGH (ref 70–99)
Glucose-Capillary: 230 mg/dL — ABNORMAL HIGH (ref 70–99)
Glucose-Capillary: 148 mg/dL — ABNORMAL HIGH (ref 70–99)
Glucose-Capillary: 141 mg/dL — ABNORMAL HIGH (ref 70–99)

## 2021-10-19 MED ORDER — TORSEMIDE 20 MG PO TABS
40.0000 mg | ORAL_TABLET | Freq: Two times a day (BID) | ORAL | Status: DC
  Administered 2021-10-19: 40 mg via ORAL
  Filled 2021-10-19: qty 2

## 2021-10-19 MED ORDER — PROSOURCE PLUS PO LIQD
30.0000 mL | Freq: Three times a day (TID) | ORAL | Status: DC
  Administered 2021-10-19 – 2021-10-26 (×11): 30 mL via ORAL
  Filled 2021-10-19 (×23): qty 30

## 2021-10-19 MED ORDER — FUROSEMIDE 10 MG/ML IJ SOLN
60.0000 mg | Freq: Two times a day (BID) | INTRAMUSCULAR | Status: DC
  Administered 2021-10-19: 60 mg via INTRAVENOUS
  Filled 2021-10-19: qty 8

## 2021-10-19 NOTE — Progress Notes (Signed)
MRI staff called for a routine MRI ordered but pt still on BIPAP. Pt still cant tolerated laying flat at this time. NP Randol Kern made aware. Will continue to monitor.

## 2021-10-19 NOTE — Progress Notes (Signed)
Nutrition Follow-up  DOCUMENTATION CODES:   Morbid obesity, Non-severe (moderate) malnutrition in context of acute illness/injury  INTERVENTION:   -Continue Ensure Max po daily, each supplement provides 150 kcal and 30 grams of protein -Continue MVI with minerals daily -Continue 500 mg vitamin C BID -Continue double protein portions with meals -Continue feeding assistance with meals -Continue HS snack daily -30 ml Prosource Plus TID, each supplement provides 100 kcals and 15 grams protein  NUTRITION DIAGNOSIS:   Moderate Malnutrition related to acute illness (L heel osteomyelitis) as evidenced by energy intake < 75% for > 7 days, mild fat depletion.  Ongoing  GOAL:   Patient will meet greater than or equal to 90% of their needs  Progressing   MONITOR:   PO intake, Supplement acceptance, Labs, Weight trends, I & O's  REASON FOR ASSESSMENT:   Consult Wound healing  ASSESSMENT:   Pt admitted with wound infection. Found to have osteomyelitis of L heel. PMH significant for uncontrolled [id number], chronic diastolic dysfunction CHF, HTN, CAD, COPD, afib, morbid obesity, OSA, s/p recent wound debridement and partial calcanectomy (05/16).  6/6- s/p lt BKA  Reviewed I/O's: -2.3 L x 24 hours and -30.2 L since 10/05/21  UOP: 2.5 L x 24 hours   Pt sitting up in bed, working with nurse tech at time of visit. Pt reports feeling good today. He reports good appetite and states that he is eating his protein and drinking his supplements. He does not remember if he drank his protein shake last night. Documented meal completions75-100%.   Received call from pt wife, who reports concern that pt is not eating double portions of protein. She shares that pt refused his [place] last night. Observed breakfast tray, pt consumed 100% of his breakfast tray other than his sausage patty. Wife concerned about pt's protein intake and is asking if there are other ways we can increase pt's protein intake.  Reviewed supplements with pt wife and she is amenable to Prosource supplements.   Medications reviewed and include vitamin C, vitamin D, ferrous sulfate, lasix, cardizem, and vitamin B-12.   Per TOC notes, plan for SNF placement once medically stable.  Labs reviewed: CBGS: [phone] (inpatient orders for glycemic control are 0-20 units insulin aspart TID with meals, 6 units insulin aspart, and 21 units insulin glargine-yfgn BID).    Diet Order:   Diet Order             Diet Carb Modified Fluid consistency: Thin; Room service appropriate? Yes; Fluid restriction: 1800 mL Fluid  Diet effective now                   EDUCATION NEEDS:   Not appropriate for education at this time  Skin:  Skin Assessment: Skin Integrity Issues: Skin Integrity Issues:: Other (Comment), Incisions Wound Vac: - Incisions: s/p lt BKA Other: venous statsis ulcer to rt pretibial, MASD to abdominal folds and groin  Last BM:  10/15/21  Height:   Ht Readings from Last 1 Encounters:  10/16/21 '6\' 4"'$  (1.93 m)    Weight:   Wt Readings from Last 1 Encounters:  10/19/21 (!) 159.3 kg    Ideal Body Weight:  91.8 kg  BMI:  Body mass index is 42.74 kg/m.  Estimated Nutritional Needs:   Kcal:  2100-2300  Protein:  120-140 grams  Fluid:  > 2 L    Loistine Chance, RD, LDN, [place] Registered Dietitian II Certified Diabetes Care and Education Specialist Please refer to [facility] for RD and/or  RD on-call/weekend/after hours pager

## 2021-10-19 NOTE — Progress Notes (Signed)
OT Cancellation Note  Patient Details Name: [name]. MRN: [id number] DOB: 1955/05/22   Cancelled Treatment:    Reason Eval/Treat Not Completed: Other (comment) (pt is on bipap right now per RN, reports not approrpiate for OT tx on this date. OT will reattempt as able.Shanon Payor, OTD OTR/L  10/19/21, 2:33 PM

## 2021-10-19 NOTE — Progress Notes (Signed)
Progress Note   Patient: Alec [name]. UEA:[id number] DOB: 27-Nov-1955 DOA: 09/28/2021     21 DOS: the patient was seen and examined on 10/19/2021   Brief hospital course: Alec [name]. is a 66 y.o. male with medical history significant for chronic diastolic dysfunction CHF with LVEF of 50 to 55%, hypertension, coronary artery disease, depression, COPD, A-fib, morbid obesity (BMI 49), obstructive sleep apnea, status post recent wound debridement and partial calcanectomy (05/16) currently on oral antibiotic therapy after completion of IV who presents to the ER for evaluation of bilateral lower extremity swelling as well as increased redness and swelling involving the left lower extremity mostly over the heel. MRI of the foot showed left foot osteomyelitis, placed on vancomycin and Zosyn. Blood culture positive for MRSA secondary to osteomyelitis.   Left BKA performed on 6/6.  Due to volume overload due to acute on chronic diastolic congestive heart failure with secondary hypoxic respiratory failure.  Started IV Lasix 6/6 through 6/17.  Had been transitioned to oral torsemide, however due to persistent hypoxia and worsening chest x-ray with new pleural effusion and pulmonary edema, resumed on IV Lasix at 60 mg twice daily (p.m. on 6/18).  Currently patient on daptomycin as per ID to treat MRSA sepsis and will need to be on for a total of 4 weeks.  Repeat blood cultures negative.  Acute delirium during the entire hospitalization.  Mental status contniues to wax and wane.  Answering questions appropriately.  Continue Seroquel at night and BiPAP at night.  Foley catheter placed on 6/11 secondary to urinary retention.  Flomax increased to twice a day dosing and added Proscar.  Foley removed for voiding trial on 6/16, retention appears resolved.  Voiding well without significant post-void residuals per nursing.    Assessment and Plan: * Sepsis due to methicillin resistant Staphylococcus  aureus (MRSA) ([place]) MRSA growing out of blood cultures on 09/28/2021.  Repeat blood cultures negative.  Continue daptomycin.  Source of infection likely osteomyelitis.  Will need 4 weeks total of antibiotics total.  Acute on chronic respiratory failure with hypoxia and hypercapnia (HCC) Requires BiPAP at night in order to to prevent CO2 narcosis and encephalopathy.   Last PCO2 76 today after being on BiPAP for few hours during the day. -- qualifies for NIV at rehab.  I stressed the importance to respiratory that he needs to be on the BiPAP especially all night to lower this PCO2.     Acute delirium Mental status has waxed and waned through the entire hospital course.  Continue BiPAP at night to blow off CO2.  We will get an MRI of the brain to rule out stroke or other process.  We will discontinue Seroquel since patient is lethargic during the day.  Acute on chronic diastolic CHF (congestive heart failure) ([place]) Patient's last known LVEF is 55 to 60% from a 2D echocardiogram which was done 01/23.  -Continue metoprolol and switch IV Lasix over to oral torsemide to 40 mg twice a day.  Case discussed with cardiology.     Osteomyelitis ([place]) Status post left BKA on 10/03/2021. Patient on IV daptomycin.    Acute urinary retention Foley catheter placed on 10/08/2021.  Foley [place] on 6/16 and passed voiding trial.  Uncontrolled type 2 diabetes mellitus with hyperglycemia, with long-term current use of insulin (HCC) Uncontrolled.  Last hemoglobin A1c in the chart of 10.2.  Continue Semglee insulin to 21 units twice a day.  Continue short acting insulin prior to  meals plus sliding scale.  AF (paroxysmal atrial fibrillation) (HCC) Continue amiodarone, Cardizem CD and metoprolol Eliquis for anticoagulation.     Obesity, Class III, BMI 40-49.9 (morbid obesity) (HCC) BMI down to 24.58 Complicates overall care and prognosis.  Recommend lifestyle modifications including physical activity and diet  for weight loss and overall long-term health.   Essential hypertension, benign Continue metoprolol, Cardizem and torsemide  RLS (restless legs syndrome) Continue pramipexole and gabapentin  Iron deficiency anemia With ferritin of 22 this is iron deficiency anemia.  Last hemoglobin 10.3  Hyperkalemia Resolved with Lokelma on 6/17, 6/19. Monitor BMP.  OSA (obstructive sleep apnea) Previously using CPAP at night. Now on BiPAP secondary to CO2 retention.  Qualifies for NIV with elevated PCO2 on ABG.  Acute respiratory failure with hypoxia and hypercapnia (HCC) See above  Acute metabolic encephalopathy See acute delirium  Generalized weakness PT and OT recommending SNF. Fall precautions Continue PT OT here Out of bed to chair, mobilize as tolerated  Malnutrition of moderate degree Related to acute illness (left heel osteomyelitis) and energy intake of [name] than 75% for more than 7 days, mild fat depletion. -- Glucerna and ensures, vitamins        Subjective: Patient was seen around lunchtime and patient was on BiPAP at that time.  We had him on BiPAP for a while and repeat ABG shows a PCO2 of 76.  The patient was lethargic and did answer a few questions but went right back to sleep.  Physical Exam: Vitals:   10/19/21 0436 10/19/21 0804 10/19/21 1155 10/19/21 1200  BP: 94/72 115/80 109/68   Pulse:  (!) 108 (!) 109   Resp: 20 17 (!) 22   Temp: 97.6 F (36.4 C) 97.8 F (36.6 C) 98 F (36.7 C)   TempSrc: Oral Oral Oral   SpO2: 93% 90% 91% 91%  Weight: (!) 159.3 kg     Height:       Physical Exam HENT:     Head: Normocephalic.  Eyes:     General: Lids are normal.     Conjunctiva/sclera: Conjunctivae normal.  Cardiovascular:     Rate and Rhythm: Tachycardia present. Rhythm irregularly irregular.     Heart sounds: Normal heart sounds, S1 normal and S2 normal.  Pulmonary:     Breath sounds: No decreased breath sounds, wheezing, rhonchi or rales.  Abdominal:      Palpations: Abdomen is soft.     Tenderness: There is no abdominal tenderness.  Musculoskeletal:     Right lower leg: Edema present.     Left Lower Extremity: Left leg is amputated below knee.  Skin:    General: Skin is warm.     Comments: Chronic lower extremity skin discoloration right lower leg.   Neurological:     Mental Status: He is lethargic.     Comments: Patient answers a few questions and then went back to sleep.     Data Reviewed: ABG shows 7.4 4/76/70/51 Labs creatinine 0.81, CO2 up at 45, last hemoglobin 10.3, ferritin down to 22  Family Communication: Spoke with patient's wife on the phone  Disposition: Status is: Inpatient Remains inpatient appropriate because: Lethargic today with elevated PCO2.  Continue BiPAP during the day today and overnight.  Reassess tomorrow.  MRI of the brain. Planned Discharge Destination: Rehab once a rehab bed found   Author: Loletha Grayer, MD 10/19/2021 3:26 PM  For on call review [email or web address].

## 2021-10-19 NOTE — Progress Notes (Signed)
[facility]  Patient ID: [name]. MRN: [id number] DOB/AGE: January 11, 1956 66 y.o.   Admit date: 09/28/2021 Referring Physician Dr. Francine Graven Primary Physician Dr. Felipa Furnace Primary Cardiologist Dr. Donnelly Angelica Reason for Consultation acute on chronic HFpEF    HPI: [name]. is a [phone] with a PMH significant for CAD s/p CABG 2006 and PCI of RCA 01/2020, HFpEF (LVEF 50-55% 04/2021), A. fib / flutter on apixaban, amiodarone and digoxin, HTN, BPH, morbid obesity, poorly controlled type 2 diabetes,  PAD s/p left third toe amputation, chronic venous stasis, OSA on CPAP who presented to [facility] ED on 09/28/21 with increasing pain and swelling in his left leg, and imaging concerning for worsening osteomyelitis of his left foot. He has has onoging infections needing wound debridement in his left lower leg over the past several months. Cardiology is consulted for assistance with his heart failure.   Interval History:  -No acute events - No rehab facility that is going to accept the patient. Likely will remain inpatient for quite some time.  - Sleepy but arousable this morning. Answers in 1 word.    Vitals:   10/18/21 2350 10/19/21 0006 10/19/21 0436 10/19/21 0804  BP: 110/74  94/72 115/80  Pulse:  (!) 107  (!) 108  Resp: '20 19 20 17  '$ Temp: 97.7 F (36.5 C)  97.6 F (36.4 C) 97.8 F (36.6 C)  TempSrc: Oral  Oral Oral  SpO2: 92% 92% 93% 90%  Weight:   (!) 159.3 kg   Height:         Intake/Output Summary (Last 24 hours) at 10/19/2021 5361 Last data filed at 10/19/2021 4431 Gross per 24 hour  Intake 240 ml  Output 2500 ml  Net -2260 ml    PHYSICAL EXAM General: Ill-appearing Caucasian male,  in no acute distress.  Sitting upright in hospital bed without family at bedside.   HEENT:  Normocephalic and atraumatic. Neck:   No JVD.  Lungs: Normal respiratory effort on oxygen by .  Clear to auscultation bilaterally Heart: Tachy but regular. Normal S1 and S2 without  gallops or murmurs.  Abdomen: Obese appearing.  Msk: Normal strength and tone for age. Extremities:  RLE with venous stasis changes and woody appearance to skin,  Left leg BKA with dry padded dressing, lateral part of wound visible with well approximated wound edges with stables present, no purulence or erythema.  Both legs are much softer and [name] edematous than on presentation Neuro: Alert and oriented x3 Psych: answers questions appropriately    LABS: Basic Metabolic Panel: Recent Labs    10/17/21 0625 10/18/21 0525  NA 142 141  K 4.6 4.6  CL 92* 89*  CO2 44* 45*  GLUCOSE 92 91  BUN 32* 29*  CREATININE 0.77 0.81  CALCIUM 8.7* 8.7*    Liver Function Tests: No results for input(s): "AST", "ALT", "ALKPHOS", "BILITOT", "PROT", "ALBUMIN" in the last 72 hours.  No results for input(s): "LIPASE", "AMYLASE" in the last 72 hours. CBC: No results for input(s): "WBC", "NEUTROABS", "HGB", "HCT", "MCV", "PLT" in the last 72 hours.  Cardiac Enzymes: No results for input(s): "CKTOTAL", "CKMB", "CKMBINDEX", "TROPONINI" in the last 72 hours.  BNP: Invalid input(s): "POCBNP" D-Dimer: No results for input(s): "DDIMER" in the last 72 hours. Hemoglobin A1C: No results for input(s): "HGBA1C" in the last 72 hours. Fasting Lipid Panel: No results for input(s): "CHOL", "HDL", "LDLCALC", "TRIG", "CHOLHDL", "LDLDIRECT" in the last 72 hours. Thyroid Function Tests: No results for input(s): "  TSH", "T4TOTAL", "T3FREE", "THYROIDAB" in the last 72 hours.  Invalid input(s): "FREET3" Anemia Panel: Recent Labs    10/18/21 0525  FERRITIN 22*    DG Chest Port 1 View  Result Date: 10/17/2021 CLINICAL DATA:  Provided history: Hypoxia. EXAM: PORTABLE CHEST 1 VIEW COMPARISON:  Provided history: Prior chest radiographs 10/14/2021 and earlier. FINDINGS: Prior median sternotomy/CABG. Cardiomegaly. Central pulmonary vascular congestion. Persistent small right pleural effusion. Additional ill-defined  opacity within the right mid to lower lung field has an appearance favoring atelectasis. Minimal atelectasis within the left lung base. No evidence of pneumothorax. Degenerative changes of the spine. IMPRESSION: Cardiomegaly with central pulmonary vascular congestion. Persistent small right pleural effusion. Additional ill-defined opacity within the right mid to lower lung field has an appearance favoring atelectasis. However, superimposed pneumonia cannot be excluded and clinical correlation is recommended. Minimal atelectasis within the left lung base. Electronically Signed   By: Kellie Simmering D.O.   On: 10/17/2021 10:35     Echo LVEF 50-55% 05/10/2021  TELEMETRY: Atrial fibrillation 111 bpm:  ASSESSMENT AND PLAN:  Principal Problem:   Sepsis due to methicillin resistant Staphylococcus aureus (MRSA) (HCC) Active Problems:   Essential hypertension, benign   RLS (restless legs syndrome)   Osteomyelitis (HCC)   Obesity, Class III, BMI 40-49.9 (morbid obesity) (HCC)   Acute on chronic diastolic CHF (congestive heart failure) (HCC)   AF (paroxysmal atrial fibrillation) ([place])   Uncontrolled type 2 diabetes mellitus with hyperglycemia, with long-term current use of insulin (HCC)   Malnutrition of moderate degree   Generalized weakness   Acute metabolic encephalopathy   Acute respiratory failure with hypoxia and hypercapnia (HCC)   OSA (obstructive sleep apnea)   Acute delirium   Acute on chronic respiratory failure with hypoxia and hypercapnia (HCC)   Anemia   Acute urinary retention   Hyperkalemia    1. Acute on chronic diastolic congestive heart failure, HFpEF, LVEF 50-55% 05/10/2021, good diuresis after IV furosemide but remains on supplemental O2 2.  Paroxysmal atrial fibrillation/atrial flutter, currently in atrial flutter heart rate 109 bpm, on amiodarone and metoprolol for rate control and Eliquis for stroke prevention, cardizem '180mg'$  CD 3.  Coronary artery disease, status post CABG  2006, PCI RCA 01/2020, currently without chest pain 4.  Left foot osteomyelitis on IV vancomycin, left BKA 6/6 with Dr. Delana Meyer 5.  Acute kidney injury, improving to Cr 1.03 and >60  6.  MRSA bacteremia, osteomyelitis of L foot likely source, on IV daptomycin, repeat echo ordered by ID, resulted with thickened aortic and mitral leaflets without obvious vegetation. Will need 4 weeks of Abx, per ID 7. Urinary retention, improved after increasing flomax to BID and adding proscar, currently with Foley catheter, planning voiding trial before discharge   Recommendations   1.  Agree with current therapy 2.  Lasix 60 mg IV BID since 6/18--> transition to torsemide 40 mg BID today (home dose 60 mg daily) 3.  Eliquis '5mg'$  BID restarted 6/9 for stroke risk reduction  4.  Continue amiodarone '200mg'$  once daily, metoprolol tartrate '100mg'$  BID, and cut back cardizem CD from '180mg'$  to 120 mg for rate and rhythm control with softer Bps  -wean midodrine as able 5.  S/p Left BKA 6/6 6.  Pending rehab bed placement   Andrez Grime, MD 10/19/2021 9:22 AM

## 2021-10-19 NOTE — TOC Progression Note (Addendum)
Transition of Care (TOC) - Progression Note    Patient Details  Name: [name]. MRN: [id number] Date of Birth: 1955/08/25  Transition of Care [facility]) CM/SW [place], [place] Phone Number: 10/19/2021, 8:51 AM  Clinical Narrative:     Patient continues to have no bed offers except [facility] which patient's spouse has refused.   Despite multiple attempts to re send clinicals for re-review from facilities, no facilities have accepted.Patient's clinicals have been faxed to over 50 facilities.  Barriers include not all facilities are in network with patient's insurance, he has extensive needs, not all facilities have staff avail for high needs, and patient will need bariatric bed which not all facilities can accommodate.   TOC supervisor has been updated to f/u with spouse on above.   At this time, discharge options would be [facility] vs home with hh, neither of which patient's spouse is in agreement with.    Expected Discharge Plan: [place] Barriers to Discharge: Continued Medical Work up  Expected Discharge Plan and Services Expected Discharge Plan: [place] In-house Referral: NA   Post Acute Care Choice: Resumption of Svcs/PTA Provider Living arrangements for the past 2 months: Single Family Home                                       Social Determinants of Health (SDOH) Interventions    Readmission Risk Interventions    09/29/2021   11:12 AM  Readmission Risk Prevention Plan  Transportation Screening Complete  PCP or Specialist Appt within 3-5 Days Complete  HRI or [place] Complete  Social Work Consult for [place] Planning/Counseling Complete  Palliative Care Screening Not Applicable  Medication Review Press photographer) Complete

## 2021-10-19 NOTE — Progress Notes (Signed)
PT Cancellation Note  Patient Details Name: [name] MRN: [id number] DOB: [birth year]   Cancelled Treatment:    Reason Eval/Treat Not Completed: Fatigue/lethargy limiting ability to participate: Nursing requested PT to hold this date secondary to pt lethargy and need of BiPAP. Will attempt to see pt at a future date/time as medically appropriate.      Linus Salmons PT, DPT 10/19/21, 2:45 PM

## 2021-10-19 NOTE — Progress Notes (Signed)
O2 sats 85% on 5L [employer], placed on Bipap. O2 sats maintaining at 87% on Bipap with 35% Fio2.  RT notified, Bipap settings changed to 15/8 at 50% FIO2 with O2 sats of 91%.

## 2021-10-19 NOTE — Progress Notes (Signed)
PT on BIPAP for HS. Tolerating well at this time. SpO2 92%, HR 107, RR 19.

## 2021-10-20 ENCOUNTER — Ambulatory Visit: Payer: Medicare Other | Admitting: Physician Assistant

## 2021-10-20 ENCOUNTER — Inpatient Hospital Stay: Payer: Medicare Other

## 2021-10-20 DIAGNOSIS — I5033 Acute on chronic diastolic (congestive) heart failure: Secondary | ICD-10-CM | POA: Diagnosis not present

## 2021-10-20 DIAGNOSIS — J9621 Acute and chronic respiratory failure with hypoxia: Secondary | ICD-10-CM | POA: Diagnosis not present

## 2021-10-20 DIAGNOSIS — R410 Disorientation, unspecified: Secondary | ICD-10-CM | POA: Diagnosis not present

## 2021-10-20 DIAGNOSIS — A4102 Sepsis due to Methicillin resistant Staphylococcus aureus: Secondary | ICD-10-CM | POA: Diagnosis not present

## 2021-10-20 LAB — BASIC METABOLIC PANEL
Anion gap: 8 (ref 5–15)
BUN: 31 mg/dL — ABNORMAL HIGH (ref 8–23)
CO2: 43 mmol/L — ABNORMAL HIGH (ref 22–32)
Calcium: 8.5 mg/dL — ABNORMAL LOW (ref 8.9–10.3)
Chloride: 88 mmol/L — ABNORMAL LOW (ref 98–111)
Creatinine, Ser: 1.04 mg/dL (ref 0.61–1.24)
GFR, Estimated: >60 mL/min (ref >60)
Glucose, Bld: 104 mg/dL — ABNORMAL HIGH (ref 70–99)
Potassium: 4.8 mmol/L (ref 3.5–5.1)
Sodium: 139 mmol/L (ref 135–145)

## 2021-10-20 LAB — GLUCOSE, CAPILLARY
Glucose-Capillary: 118 mg/dL — ABNORMAL HIGH (ref 70–99)
Glucose-Capillary: 161 mg/dL — ABNORMAL HIGH (ref 70–99)
Glucose-Capillary: 148 mg/dL — ABNORMAL HIGH (ref 70–99)
Glucose-Capillary: 107 mg/dL — ABNORMAL HIGH (ref 70–99)

## 2021-10-20 LAB — HEMOGLOBIN: Hemoglobin: 10.2 g/dL — ABNORMAL LOW (ref 13.0–17.0)

## 2021-10-20 NOTE — Progress Notes (Addendum)
Patient's BIPAP reapplied

## 2021-10-21 ENCOUNTER — Inpatient Hospital Stay: Payer: Medicare Other

## 2021-10-21 DIAGNOSIS — J9621 Acute and chronic respiratory failure with hypoxia: Secondary | ICD-10-CM | POA: Diagnosis not present

## 2021-10-21 DIAGNOSIS — E11649 Type 2 diabetes mellitus with hypoglycemia without coma: Secondary | ICD-10-CM

## 2021-10-21 DIAGNOSIS — R410 Disorientation, unspecified: Secondary | ICD-10-CM | POA: Diagnosis not present

## 2021-10-21 DIAGNOSIS — I5033 Acute on chronic diastolic (congestive) heart failure: Secondary | ICD-10-CM | POA: Diagnosis not present

## 2021-10-21 DIAGNOSIS — A4102 Sepsis due to Methicillin resistant Staphylococcus aureus: Secondary | ICD-10-CM | POA: Diagnosis not present

## 2021-10-21 LAB — URINALYSIS, ROUTINE W REFLEX MICROSCOPIC
Bilirubin Urine: NEGATIVE
Glucose, UA: NEGATIVE mg/dL
Ketones, ur: NEGATIVE mg/dL
Nitrite: NEGATIVE
Protein, ur: 100 mg/dL — AB
Specific Gravity, Urine: 1.008 (ref 1.005–1.030)
WBC, UA: >50 WBC/hpf — ABNORMAL HIGH (ref 0–5)
pH: 6 (ref 5.0–8.0)

## 2021-10-21 LAB — GLUCOSE, CAPILLARY
Glucose-Capillary: 111 mg/dL — ABNORMAL HIGH (ref 70–99)
Glucose-Capillary: 58 mg/dL — ABNORMAL LOW (ref 70–99)
Glucose-Capillary: 244 mg/dL — ABNORMAL HIGH (ref 70–99)
Glucose-Capillary: 132 mg/dL — ABNORMAL HIGH (ref 70–99)
Glucose-Capillary: 95 mg/dL (ref 70–99)

## 2021-10-21 LAB — BASIC METABOLIC PANEL
Anion gap: 7 (ref 5–15)
BUN: 32 mg/dL — ABNORMAL HIGH (ref 8–23)
CO2: 42 mmol/L — ABNORMAL HIGH (ref 22–32)
Calcium: 8.5 mg/dL — ABNORMAL LOW (ref 8.9–10.3)
Chloride: 90 mmol/L — ABNORMAL LOW (ref 98–111)
Creatinine, Ser: 0.85 mg/dL (ref 0.61–1.24)
GFR, Estimated: >60 mL/min (ref >60)
Glucose, Bld: 77 mg/dL (ref 70–99)
Potassium: 4.6 mmol/L (ref 3.5–5.1)
Sodium: 139 mmol/L (ref 135–145)

## 2021-10-21 MED ORDER — TORSEMIDE 20 MG PO TABS
40.0000 mg | ORAL_TABLET | Freq: Every day | ORAL | Status: DC
Start: 2021-10-21 — End: 2021-10-22
  Administered 2021-10-21: 40 mg via ORAL
  Filled 2021-10-21: qty 2

## 2021-10-21 MED ORDER — HALOPERIDOL LACTATE 5 MG/ML IJ SOLN
1.0000 mg | Freq: Four times a day (QID) | INTRAMUSCULAR | Status: DC | PRN
  Administered 2021-10-21 – 2021-11-04 (×11): 1 mg via INTRAVENOUS
  Filled 2021-10-21 (×12): qty 1

## 2021-10-21 MED ORDER — INSULIN ASPART 100 UNIT/ML IJ SOLN
4.0000 [IU] | Freq: Three times a day (TID) | INTRAMUSCULAR | Status: DC
  Administered 2021-10-22 – 2021-10-24 (×8): 4 [IU] via SUBCUTANEOUS
  Filled 2021-10-21 (×8): qty 1

## 2021-10-21 MED ORDER — HALOPERIDOL LACTATE 5 MG/ML IJ SOLN
1.0000 mg | Freq: Once | INTRAMUSCULAR | Status: AC
Start: 2021-10-21 — End: 2021-10-21
  Administered 2021-10-21: 1 mg via INTRAVENOUS
  Filled 2021-10-21: qty 1

## 2021-10-21 MED ORDER — INSULIN GLARGINE-YFGN 100 UNIT/ML ~~LOC~~ SOLN
18.0000 [IU] | Freq: Every day | SUBCUTANEOUS | Status: DC
  Filled 2021-10-21 (×2): qty 0.18

## 2021-10-21 MED ORDER — INSULIN GLARGINE-YFGN 100 UNIT/ML ~~LOC~~ SOLN
10.0000 [IU] | Freq: Every day | SUBCUTANEOUS | Status: DC
  Filled 2021-10-21: qty 0.1

## 2021-10-21 MED ORDER — HALOPERIDOL LACTATE 5 MG/ML IJ SOLN
2.5000 mg | Freq: Once | INTRAMUSCULAR | Status: AC
  Administered 2021-10-21: 2.5 mg via INTRAVENOUS
  Filled 2021-10-21: qty 1

## 2021-10-22 DIAGNOSIS — I509 Heart failure, unspecified: Secondary | ICD-10-CM | POA: Diagnosis not present

## 2021-10-22 DIAGNOSIS — A4102 Sepsis due to Methicillin resistant Staphylococcus aureus: Secondary | ICD-10-CM | POA: Diagnosis not present

## 2021-10-22 DIAGNOSIS — N3001 Acute cystitis with hematuria: Secondary | ICD-10-CM

## 2021-10-22 DIAGNOSIS — J9621 Acute and chronic respiratory failure with hypoxia: Secondary | ICD-10-CM | POA: Diagnosis not present

## 2021-10-22 DIAGNOSIS — N390 Urinary tract infection, site not specified: Secondary | ICD-10-CM

## 2021-10-22 LAB — BASIC METABOLIC PANEL
Anion gap: 7 (ref 5–15)
BUN: 34 mg/dL — ABNORMAL HIGH (ref 8–23)
CO2: 42 mmol/L — ABNORMAL HIGH (ref 22–32)
Calcium: 8.7 mg/dL — ABNORMAL LOW (ref 8.9–10.3)
Chloride: 91 mmol/L — ABNORMAL LOW (ref 98–111)
Creatinine, Ser: 1.02 mg/dL (ref 0.61–1.24)
GFR, Estimated: >60 mL/min (ref >60)
Glucose, Bld: 120 mg/dL — ABNORMAL HIGH (ref 70–99)
Potassium: 4.5 mmol/L (ref 3.5–5.1)
Sodium: 140 mmol/L (ref 135–145)

## 2021-10-22 LAB — GLUCOSE, CAPILLARY
Glucose-Capillary: 151 mg/dL — ABNORMAL HIGH (ref 70–99)
Glucose-Capillary: 245 mg/dL — ABNORMAL HIGH (ref 70–99)
Glucose-Capillary: 225 mg/dL — ABNORMAL HIGH (ref 70–99)
Glucose-Capillary: 161 mg/dL — ABNORMAL HIGH (ref 70–99)

## 2021-10-22 MED ORDER — GABAPENTIN 100 MG PO CAPS
200.0000 mg | ORAL_CAPSULE | Freq: Every day | ORAL | Status: DC
  Administered 2021-10-22: 200 mg via ORAL
  Filled 2021-10-22 (×2): qty 2

## 2021-10-22 MED ORDER — FUROSEMIDE 10 MG/ML IJ SOLN
40.0000 mg | Freq: Two times a day (BID) | INTRAMUSCULAR | Status: DC
  Administered 2021-10-22 – 2021-10-23 (×3): 40 mg via INTRAVENOUS
  Filled 2021-10-22 (×3): qty 4

## 2021-10-22 MED ORDER — INSULIN GLARGINE-YFGN 100 UNIT/ML ~~LOC~~ SOLN
21.0000 [IU] | Freq: Every day | SUBCUTANEOUS | Status: DC
  Administered 2021-10-22 – 2021-10-26 (×5): 21 [IU] via SUBCUTANEOUS
  Filled 2021-10-22 (×5): qty 0.21

## 2021-10-22 MED ORDER — SODIUM CHLORIDE 0.9 % IV SOLN
1.0000 g | INTRAVENOUS | Status: AC
  Administered 2021-10-22 – 2021-10-24 (×3): 1 g via INTRAVENOUS
  Filled 2021-10-22 (×3): qty 1

## 2021-10-22 MED ORDER — HALOPERIDOL 0.5 MG PO TABS
0.5000 mg | ORAL_TABLET | Freq: Two times a day (BID) | ORAL | Status: DC
  Administered 2021-10-22 – 2021-10-23 (×2): 0.5 mg via ORAL
  Filled 2021-10-22 (×2): qty 1

## 2021-10-22 MED ORDER — QUETIAPINE FUMARATE 25 MG PO TABS
12.5000 mg | ORAL_TABLET | Freq: Every evening | ORAL | Status: DC | PRN
  Administered 2021-10-27 – 2021-11-04 (×8): 12.5 mg via ORAL
  Filled 2021-10-22 (×9): qty 1

## 2021-10-22 NOTE — Progress Notes (Signed)
Entered patient room to find Bipap alarming. Patient pulled the Bipap mask off and tore it to pieces. Patient is alert and oriented. Follows commands and is appropriate. SpO2 82-83% on room air. Patient placed on HHFNC 50L @ 50% for SpO2 92%. Will continue to monitor for compliance.

## 2021-10-22 NOTE — Assessment & Plan Note (Addendum)
Citrobacter growing out of urine culture.  Resistant to the Rocephin that I gave for 3 days.  I started Bactrim this morning but since the patient had low-grade fever and possible pleural effusion I will change to cefepime.

## 2021-10-23 DIAGNOSIS — A4102 Sepsis due to Methicillin resistant Staphylococcus aureus: Secondary | ICD-10-CM | POA: Diagnosis not present

## 2021-10-23 DIAGNOSIS — N3001 Acute cystitis with hematuria: Secondary | ICD-10-CM | POA: Diagnosis not present

## 2021-10-23 DIAGNOSIS — J9621 Acute and chronic respiratory failure with hypoxia: Secondary | ICD-10-CM | POA: Diagnosis not present

## 2021-10-23 DIAGNOSIS — R410 Disorientation, unspecified: Secondary | ICD-10-CM | POA: Diagnosis not present

## 2021-10-23 LAB — BLOOD GAS, ARTERIAL
Acid-Base Excess: 21.9 mmol/L — ABNORMAL HIGH (ref 0.0–2.0)
Bicarbonate: 49.1 mmol/L — ABNORMAL HIGH (ref 20.0–28.0)
Delivery systems: POSITIVE
Expiratory PAP: 8 cmH2O
FIO2: 70 %
Inspiratory PAP: 16 cmH2O
O2 Saturation: 96 %
Patient temperature: 37
pCO2 arterial: 69 mmHg (ref 32–48)
pH, Arterial: 7.46 — ABNORMAL HIGH (ref 7.35–7.45)
pO2, Arterial: 74 mmHg — ABNORMAL LOW (ref 83–108)

## 2021-10-23 LAB — BASIC METABOLIC PANEL
Anion gap: 10 (ref 5–15)
BUN: 39 mg/dL — ABNORMAL HIGH (ref 8–23)
CO2: 41 mmol/L — ABNORMAL HIGH (ref 22–32)
Calcium: 8.5 mg/dL — ABNORMAL LOW (ref 8.9–10.3)
Chloride: 90 mmol/L — ABNORMAL LOW (ref 98–111)
Creatinine, Ser: 1.06 mg/dL (ref 0.61–1.24)
GFR, Estimated: >60 mL/min (ref >60)
Glucose, Bld: 140 mg/dL — ABNORMAL HIGH (ref 70–99)
Potassium: 4.1 mmol/L (ref 3.5–5.1)
Sodium: 141 mmol/L (ref 135–145)

## 2021-10-23 LAB — CBC
HCT: 31.4 % — ABNORMAL LOW (ref 39.0–52.0)
Hemoglobin: 9 g/dL — ABNORMAL LOW (ref 13.0–17.0)
MCH: 27.5 pg (ref 26.0–34.0)
MCHC: 28.7 g/dL — ABNORMAL LOW (ref 30.0–36.0)
MCV: 96 fL (ref 80.0–100.0)
Platelets: 228 10*3/uL (ref 150–400)
RBC: 3.27 MIL/uL — ABNORMAL LOW (ref 4.22–5.81)
RDW: 17.1 % — ABNORMAL HIGH (ref 11.5–15.5)
WBC: 11.8 10*3/uL — ABNORMAL HIGH (ref 4.0–10.5)
nRBC: 0 % (ref 0.0–0.2)

## 2021-10-23 LAB — GLUCOSE, CAPILLARY
Glucose-Capillary: 133 mg/dL — ABNORMAL HIGH (ref 70–99)
Glucose-Capillary: 180 mg/dL — ABNORMAL HIGH (ref 70–99)
Glucose-Capillary: 133 mg/dL — ABNORMAL HIGH (ref 70–99)
Glucose-Capillary: 139 mg/dL — ABNORMAL HIGH (ref 70–99)

## 2021-10-23 LAB — CK: Total CK: 19 U/L — ABNORMAL LOW (ref 49–397)

## 2021-10-23 MED ORDER — RISPERIDONE 1 MG PO TABS
1.0000 mg | ORAL_TABLET | Freq: Two times a day (BID) | ORAL | Status: DC
  Administered 2021-10-23: 1 mg via ORAL
  Filled 2021-10-23 (×2): qty 1

## 2021-10-23 MED ORDER — SODIUM CHLORIDE 0.9 % IV SOLN
6.0000 mg/kg | INTRAVENOUS | Status: DC
  Administered 2021-10-23: 800 mg via INTRAVENOUS
  Filled 2021-10-23 (×2): qty 16
  Filled 2021-10-23: qty 6

## 2021-10-23 MED ORDER — TORSEMIDE 20 MG PO TABS
40.0000 mg | ORAL_TABLET | Freq: Every day | ORAL | Status: DC
  Administered 2021-10-24: 40 mg via ORAL
  Filled 2021-10-23: qty 2

## 2021-10-23 NOTE — Progress Notes (Signed)
OT Cancellation Note  Patient Details Name: [name]. MRN: [id number] DOB: [birth year]   Cancelled Treatment:    Reason Eval/Treat Not Completed: Medical issues which prohibited therapy. Pt with higher level of oxygen need at this time with change in status. Unable to maintain O2 above 80's with oxygen and placed on Bipap per RN. Pt unable to actively participate in OT intervention at this time. OT to continue to follow for appropriateness.   Jackquline Denmark, MS, OTR/L , CBIS ascom [phone]  10/23/21, 10:54 AM

## 2021-10-24 ENCOUNTER — Inpatient Hospital Stay: Payer: Medicare Other

## 2021-10-24 DIAGNOSIS — A4102 Sepsis due to Methicillin resistant Staphylococcus aureus: Secondary | ICD-10-CM | POA: Diagnosis not present

## 2021-10-24 DIAGNOSIS — R410 Disorientation, unspecified: Secondary | ICD-10-CM | POA: Diagnosis not present

## 2021-10-24 DIAGNOSIS — J9621 Acute and chronic respiratory failure with hypoxia: Secondary | ICD-10-CM | POA: Diagnosis not present

## 2021-10-24 DIAGNOSIS — R6520 Severe sepsis without septic shock: Secondary | ICD-10-CM | POA: Diagnosis not present

## 2021-10-24 DIAGNOSIS — G9341 Metabolic encephalopathy: Secondary | ICD-10-CM | POA: Diagnosis not present

## 2021-10-24 DIAGNOSIS — I509 Heart failure, unspecified: Secondary | ICD-10-CM | POA: Diagnosis not present

## 2021-10-24 DIAGNOSIS — I5033 Acute on chronic diastolic (congestive) heart failure: Secondary | ICD-10-CM | POA: Diagnosis not present

## 2021-10-24 LAB — CBC
HCT: 30.6 % — ABNORMAL LOW (ref 39.0–52.0)
Hemoglobin: 9 g/dL — ABNORMAL LOW (ref 13.0–17.0)
MCH: 27.7 pg (ref 26.0–34.0)
MCHC: 29.4 g/dL — ABNORMAL LOW (ref 30.0–36.0)
MCV: 94.2 fL (ref 80.0–100.0)
Platelets: 192 10*3/uL (ref 150–400)
RBC: 3.25 MIL/uL — ABNORMAL LOW (ref 4.22–5.81)
RDW: 17.1 % — ABNORMAL HIGH (ref 11.5–15.5)
WBC: 10.3 10*3/uL (ref 4.0–10.5)
nRBC: 0 % (ref 0.0–0.2)

## 2021-10-24 LAB — BLOOD GAS, ARTERIAL
Acid-Base Excess: 21.4 mmol/L — ABNORMAL HIGH (ref 0.0–2.0)
Acid-Base Excess: 22.6 mmol/L — ABNORMAL HIGH (ref 0.0–2.0)
Bicarbonate: 47.7 mmol/L — ABNORMAL HIGH (ref 20.0–28.0)
Bicarbonate: 51.4 mmol/L — ABNORMAL HIGH (ref 20.0–28.0)
Delivery systems: POSITIVE
Expiratory PAP: 8 cmH2O
FIO2: 50 %
FIO2: 0.8 %
Inspiratory PAP: 16 cmH2O
O2 Saturation: 93.8 %
O2 Saturation: 91.5 %
Patient temperature: 37
Patient temperature: 37
pCO2 arterial: 64 mmHg — ABNORMAL HIGH (ref 32–48)
pCO2 arterial: 74 mmHg (ref 32–48)
pH, Arterial: 7.48 — ABNORMAL HIGH (ref 7.35–7.45)
pH, Arterial: 7.45 (ref 7.35–7.45)
pO2, Arterial: 63 mmHg — ABNORMAL LOW (ref 83–108)
pO2, Arterial: 60 mmHg — ABNORMAL LOW (ref 83–108)

## 2021-10-24 LAB — BASIC METABOLIC PANEL
Anion gap: 10 (ref 5–15)
BUN: 40 mg/dL — ABNORMAL HIGH (ref 8–23)
CO2: 40 mmol/L — ABNORMAL HIGH (ref 22–32)
Calcium: 8.4 mg/dL — ABNORMAL LOW (ref 8.9–10.3)
Chloride: 93 mmol/L — ABNORMAL LOW (ref 98–111)
Creatinine, Ser: 1.06 mg/dL (ref 0.61–1.24)
GFR, Estimated: >60 mL/min (ref >60)
Glucose, Bld: 136 mg/dL — ABNORMAL HIGH (ref 70–99)
Potassium: 4.3 mmol/L (ref 3.5–5.1)
Sodium: 143 mmol/L (ref 135–145)

## 2021-10-24 LAB — GLUCOSE, CAPILLARY
Glucose-Capillary: 140 mg/dL — ABNORMAL HIGH (ref 70–99)
Glucose-Capillary: 256 mg/dL — ABNORMAL HIGH (ref 70–99)
Glucose-Capillary: 318 mg/dL — ABNORMAL HIGH (ref 70–99)
Glucose-Capillary: 231 mg/dL — ABNORMAL HIGH (ref 70–99)
Glucose-Capillary: 175 mg/dL — ABNORMAL HIGH (ref 70–99)

## 2021-10-24 LAB — LACTIC ACID, PLASMA: Lactic Acid, Venous: 0.9 mmol/L (ref 0.5–1.9)

## 2021-10-24 LAB — MRSA NEXT GEN BY PCR, NASAL: MRSA by PCR Next Gen: NOT DETECTED

## 2021-10-24 LAB — URINE CULTURE
Culture: 60000 — AB
Special Requests: NORMAL

## 2021-10-24 LAB — PROCALCITONIN: Procalcitonin: 0.27 ng/mL

## 2021-10-24 MED ORDER — BUDESONIDE 0.5 MG/2ML IN SUSP
0.5000 mg | Freq: Two times a day (BID) | RESPIRATORY_TRACT | Status: DC
  Administered 2021-10-24 – 2021-11-13 (×40): 0.5 mg via RESPIRATORY_TRACT
  Filled 2021-10-24 (×40): qty 2

## 2021-10-24 MED ORDER — FUROSEMIDE 10 MG/ML IJ SOLN: 40.0000 mg | Freq: Two times a day (BID) | INTRAMUSCULAR | Status: DC

## 2021-10-24 MED ORDER — SODIUM CHLORIDE 0.9 % IV SOLN
2.0000 g | Freq: Three times a day (TID) | INTRAVENOUS | Status: AC
  Administered 2021-10-24 – 2021-10-26 (×7): 2 g via INTRAVENOUS
  Filled 2021-10-24: qty 12.5
  Filled 2021-10-24: qty 2
  Filled 2021-10-24 (×5): qty 12.5
  Filled 2021-10-24 (×2): qty 2

## 2021-10-24 MED ORDER — RISPERIDONE 0.5 MG PO TABS
0.5000 mg | ORAL_TABLET | Freq: Two times a day (BID) | ORAL | Status: DC
  Administered 2021-10-24 – 2021-11-13 (×38): 0.5 mg via ORAL
  Filled 2021-10-24 (×42): qty 1

## 2021-10-24 MED ORDER — ACETAZOLAMIDE SODIUM 500 MG IJ SOLR
250.0000 mg | Freq: Once | INTRAMUSCULAR | Status: AC
  Administered 2021-10-24: 250 mg via INTRAVENOUS
  Filled 2021-10-24 (×2): qty 250

## 2021-10-24 MED ORDER — IPRATROPIUM-ALBUTEROL 0.5-2.5 (3) MG/3ML IN SOLN
3.0000 mL | RESPIRATORY_TRACT | Status: DC
  Administered 2021-10-24 – 2021-10-25 (×4): 3 mL via RESPIRATORY_TRACT
  Filled 2021-10-24 (×4): qty 3

## 2021-10-24 MED ORDER — SULFAMETHOXAZOLE-TRIMETHOPRIM 800-160 MG PO TABS
1.0000 | ORAL_TABLET | Freq: Two times a day (BID) | ORAL | Status: DC
  Administered 2021-10-24: 1 via ORAL
  Filled 2021-10-24: qty 1

## 2021-10-24 MED ORDER — LINEZOLID 600 MG/300ML IV SOLN
600.0000 mg | Freq: Two times a day (BID) | INTRAVENOUS | Status: DC
Start: 2021-10-24 — End: 2021-10-26
  Administered 2021-10-24 – 2021-10-26 (×4): 600 mg via INTRAVENOUS
  Filled 2021-10-24 (×4): qty 300

## 2021-10-24 MED ORDER — FUROSEMIDE 10 MG/ML IJ SOLN
60.0000 mg | Freq: Two times a day (BID) | INTRAMUSCULAR | Status: DC
  Administered 2021-10-24: 60 mg via INTRAVENOUS
  Filled 2021-10-24: qty 6

## 2021-10-24 NOTE — Consult Note (Signed)
NAME:  Alec Grewell., MRN:  [id number], DOB:  [birth year], LOS: 21 ADMISSION DATE:  09/28/2021, CONSULTATION DATE: 10/24/2021 REFERRING MD: Dr. Delana Meyer, CHIEF COMPLAINT: Hypoxia   History of Present Illness:  This is a 66 yo male who presented to [facility] ER on 06/1 via EMS from home with c/o left lower extremity erythema, warmth, and discomfort to the left lower leg.  The pt also had concerns regarding fluid overload due to increased swelling.  He reported he went to the heart failure clinic 2 days prior to ER presentation and was given iv lasix.  He previously has had a partial calcanectomy and has been followed by podiatry and the wound care clinic with wound vac in place.    ED Course  Upon arrival to the ER MR Left Foot revealed substantially increased osteomyelitis of the calcaneus; partial rupture of the plantar fascia; a large plantar heel ulcer extending to the bony margin of the calcaneus; and diffuse cellulitis along the left heel.  On call podiatrist Dr. Cleda Mccreedy consulted by ER provider.  Pt received ceftriaxone and flagyl.  CXR obtained and revealed pulmonary edema, therefore pt received iv lasix and placed on oxygen.  He was subsequently admitted to the telemetry unit for additional workup and treatment.  See detailed hospital course listed under significant events.    Pertinent  Medical History  Atrial Fibrillation  Asthma  COPD CAD Depression  Type II Diabetes Mellitus Gout  Anabolic Steroid Use HLD HTN Hypogonadism in Male Morbid Obesity MI PVD Perirectal Abscess  Pluerisy  OSA~CPAP qhs Varicella Chronic Diastolic CHF   Significant Hospital Events: Including procedures, antibiotic start and stop dates in addition to other pertinent events   06/1: Pt admitted to the [facility] unit with acute on chronic diastolic CHF and left heel osteomyelitis  06/2: Podiatry consulted for left foot osteomyelitis and felt left foot not salvageable and will require higher amputation.   Vascular surgery consulted  06/4: ID consulted for MRSA bacteremia likely source left heel wound  06/6: Pt underwent left BKA per vascular surgery  06/6 to 06/17: Pt with worsening acute on chronic hypoxic respiratory failure secondary to volume overload due to diastolic CHF.  CXR revealed new pleural effusion.  IV lasix resumed.  Requiring Bipap or [place]  06/21 to 06/27: mentation waxed and waned requiring Bipap during the day due to hypoxia  06/27: Pt requiring 100% HHFNC and transfer to the stepdown unit.  PCCM team consulted to assist with management   Cultures:   6/1 BCx: 1/4 bottles MRSA 6/4 L Foot Wound: MRSA 6/5 BCx: NG 6/24 UCx: 60k CFU / mL Citrobacter freundii   Antimicrobials:   Zosyn 6/1 x 1 Vancomycin 6/1 x 1, 6/3 >> 6/6 Metronidazole 6/2 >> 6/6 Ceftriaxone 6/2 >> 6/5, 6/25 >> 6/27 Daptomycin 6/7 >>6/27  Bactrim 6/27 x 1 Cefepime 6/27 >> Linezolid 6/27>>  Interim History / Subjective:  C/o some shortness of breath currently requiring 90% HHFNC with O2 sats 91%   Objective   Blood pressure (!) 108/92, pulse (!) 114, temperature 99.9 F (37.7 C), temperature source Axillary, resp. rate (!) 24, height '6\' 4"'$  (1.93 m), weight (!) 157.2 kg, SpO2 95 %.    FiO2 (%):  [80 %] 80 %   Intake/Output Summary (Last 24 hours) at 10/24/2021 1652 Last data filed at 10/24/2021 1327 Gross per 24 hour  Intake 979 ml  Output 642 ml  Net 337 ml   Filed Weights   10/20/21 0459 10/21/21 0500  10/23/21 0500  Weight: (!) 160.1 kg (!) 159.8 kg (!) 157.2 kg    Examination: General: Acute on chronically ill appearing male, NAD on HHFNC HENT: Supple, no JVD  Lungs: Rhonchi throughout, even, non labored  Cardiovascular: Sinus tach, rrr, no r/g, 2+ radial/1+ distal pulses Abdomen: +BS x4, obese, soft, non tender  Extremities: Left BKA, chronic RLE vascular changes  Neuro: Lethargic disoriented to situation/time, follows commands, PERRLA GU: Indwelling foley catheter draining yellow  urine   Resolved Hospital Problem list     Assessment & Plan:  Acute on chronic hypoxic respiratory failure secondary to acute diastolic CHF exacerbation, pneumonia, and atelectasis vs. right-sided pleural effusion   OSA Hx: Asthma and Obesity  - Supplemental O2 for dyspnea and/or hypoxia  - Bipap qhs  - Right-sided thoracentesis pending  - Continue abx as outlined above  - Scheduled and prn bronchodilator therapy  - Nebulized steroids  - HIGH RISK FOR INTUBATION   Acute on chronic diastolic CHF Paroxysmal atrial fibrillation  Hx: HLD, CAD, HTN, HLD, and MI Echo 10/05/21 revealed EF 55 to 60% - Continuous telemetry monitoring  - Discontinue iv lasix due to severe alkalosis and worsening encephalopathy - 250 mg iv diamox x1 dose  - Cardiology consulted appreciate input: continue amiodarone, apixaban, aspirin, ezetimibe, and finasteride  Left lower extremity osteomyelitis s/p left BKA 06/6 Pneumonia  MRSA bacteremia secondary osteomyelitis  Citrobacter freundii UTI  - Trend WBC and monitor fever curve  - Trend PCT and lactic acid  - Abx as outlined above   Urinary retention - Strict I&O's - Continue flomax   Type II Diabetes Mellitus  - CBG's ac/hs - SSI, scheduled novolog 4 units 3 times a day with meals, and semglee   Acute encephalopathy multifactorial secondary to infectious process and metabolic derangements - Frequent reorientation  - Continue seroquel, risperdal, and prn haldol  Best Practice (right click and "Reselect all SmartList Selections" daily)   Diet/type: Regular consistency (see orders) DVT prophylaxis: apixaban  GI prophylaxis: [name] Lines: [name] Foley:  Yes, and it is still needed Code Status:  full code Last date of multidisciplinary goals of care discussion [[name]]  Labs   CBC: Recent Labs  Lab 10/20/21 0719 10/23/21 0554 10/24/21 0638  WBC  --  11.8* 10.3  HGB 10.2* 9.0* 9.0*  HCT  --  31.4* 30.6*  MCV  --  96.0 94.2  PLT  --  228 192     Basic Metabolic Panel: Recent Labs  Lab 10/20/21 0719 10/21/21 0602 10/22/21 0556 10/23/21 0554 10/24/21 0638  NA 139 139 140 141 143  K 4.8 4.6 4.5 4.1 4.3  CL 88* 90* 91* 90* 93*  CO2 43* 42* 42* 41* 40*  GLUCOSE 104* 77 120* 140* 136*  BUN 31* 32* 34* 39* 40*  CREATININE 1.04 0.85 1.02 1.06 1.06  CALCIUM 8.5* 8.5* 8.7* 8.5* 8.4*   GFR: Estimated Creatinine Clearance: 111.5 mL/min (by C-G formula based on SCr of 1.06 mg/dL). Recent Labs  Lab 10/23/21 0554 10/24/21 0638  WBC 11.8* 10.3    Liver Function Tests: No results for input(s): "AST", "ALT", "ALKPHOS", "BILITOT", "PROT", "ALBUMIN" in the last 168 hours. No results for input(s): "LIPASE", "AMYLASE" in the last 168 hours. No results for input(s): "AMMONIA" in the last 168 hours.  ABG    Component Value Date/Time   PHART 7.48 (H) 10/24/2021 0434   PCO2ART 64 (H) 10/24/2021 0434   PO2ART 63 (L) 10/24/2021 0434   HCO3 47.7 (H) 10/24/2021  0434   O2SAT 93.8 10/24/2021 0434     Coagulation Profile: No results for input(s): "INR", "PROTIME" in the last 168 hours.  Cardiac Enzymes: Recent Labs  Lab 10/23/21 0554  CKTOTAL 19*    HbA1C: Hgb A1c MFr Bld  Date/Time Value Ref Range Status  08/05/2021 04:27 AM 10.2 (H) 4.8 - 5.6 % Final    Comment:    (NOTE) Pre diabetes:          5.7%-6.4%  Diabetes:              >6.4%  Glycemic control for   <7.0% adults with diabetes   05/09/2021 07:20 AM 9.2 (H) 4.8 - 5.6 % Final    Comment:    (NOTE) Pre diabetes:          5.7%-6.4%  Diabetes:              >6.4%  Glycemic control for   <7.0% adults with diabetes     CBG: Recent Labs  Lab 10/23/21 1616 10/23/21 2024 10/24/21 0711 10/24/21 1112 10/24/21 1555  GLUCAP 133* 139* 140* 256* 318*    Review of Systems:   Unable to assess pt confused  Past Medical History:  He,  has a past medical history of Arrhythmia, Asthma, CHF (congestive heart failure) ([place]), COPD (chronic obstructive pulmonary  disease) ([place]), Coronary artery disease, Depression, Diabetes mellitus without complication ([place]), Gout, History anabolic steroid use, Hyperlipidemia, Hypertension, Hypogonadism in male, Morbid obesity ([place]), Myocardial infarction ([place]), Peripheral vascular disease ([place]), Perirectal abscess, Pleurisy, Sleep apnea, and Varicella.   Surgical History:   Past Surgical History:  Procedure Laterality Date   ABDOMINAL AORTIC ANEURYSM REPAIR     ACHILLES TENDON SURGERY Left 01/10/2021   Procedure: ACHILLES LENGTHENING/KIDNER;  Surgeon: Caroline More, DPM;  Location: [facility] ORS;  Service: Podiatry;  Laterality: Left;   AMPUTATION Left 10/03/2021   Procedure: AMPUTATION BELOW KNEE;  Surgeon: Katha Cabal, MD;  Location: [facility] ORS;  Service: Vascular;  Laterality: Left;   AMPUTATION TOE Right 02/10/2016   Procedure: AMPUTATION TOE 3RD TOE;  Surgeon: Samara Deist, DPM;  Location: [facility] OR[facility];  Service: Podiatry;  Laterality: Right;   AMPUTATION TOE Left 02/24/2020   Procedure: AMPUTATION TOE;  Surgeon: Caroline More, DPM;  Location: [facility];  Service: Podiatry;  Laterality: Left;   APPLICATION OF WOUND VAC Left 02/29/2020   Procedure: APPLICATION OF WOUND VAC;  Surgeon: Caroline More, DPM;  Location: [facility];  Service: Podiatry;  Laterality: Left;   COLONOSCOPY WITH PROPOFOL [name] 11/18/2015   Procedure: COLONOSCOPY WITH PROPOFOL;  Surgeon: Manya Silvas, MD;  Location: [facility] ENDOSCOPY;  Service: Endoscopy;  Laterality: [name];   CORONARY ARTERY BYPASS GRAFT     CORONARY STENT INTERVENTION [name] 02/02/2020   Procedure: CORONARY STENT INTERVENTION;  Surgeon: Isaias Cowman, MD;  Location: [place] [facility];  Service: Cardiovascular;  Laterality: [name];   INCISION AND DRAINAGE Left 08/07/2021   Procedure: INCISION AND DRAINAGE-Partial Calcanectomy;  Surgeon: Caroline More, DPM;  Location: [facility];  Service: Podiatry;  Laterality: Left;   IRRIGATION AND DEBRIDEMENT FOOT Left 02/29/2020   Procedure:  IRRIGATION AND DEBRIDEMENT FOOT;  Surgeon: Caroline More, DPM;  Location: [facility];  Service: Podiatry;  Laterality: Left;   IRRIGATION AND DEBRIDEMENT FOOT Left 02/24/2020   Procedure: IRRIGATION AND DEBRIDEMENT FOOT;  Surgeon: Caroline More, DPM;  Location: [facility];  Service: Podiatry;  Laterality: Left;   KNEE ARTHROSCOPY     LEFT HEART CATH AND CORS/GRAFTS ANGIOGRAPHY [name] 02/02/2020  Procedure: LEFT HEART CATH AND CORS/GRAFTS ANGIOGRAPHY;  Surgeon: Teodoro Spray, MD;  Location: [place] [facility];  Service: Cardiovascular;  Laterality: [name];   LOWER EXTREMITY ANGIOGRAPHY Left 02/25/2020   Procedure: Lower Extremity Angiography;  Surgeon: Algernon Huxley, MD;  Location: [place] [facility];  Service: Cardiovascular;  Laterality: Left;   LOWER EXTREMITY ANGIOGRAPHY Left 01/04/2021   Procedure: LOWER EXTREMITY ANGIOGRAPHY;  Surgeon: Algernon Huxley, MD;  Location: [place] [facility];  Service: Cardiovascular;  Laterality: Left;   METATARSAL HEAD EXCISION Left 01/10/2021   Procedure: METATARSAL HEAD EXCISION - LEFT 5th;  Surgeon: Caroline More, DPM;  Location: [facility];  Service: Podiatry;  Laterality: Left;   PERIPHERAL VASCULAR CATHETERIZATION Right 01/24/2016   Procedure: Lower Extremity Angiography;  Surgeon: Katha Cabal, MD;  Location: [place] [facility];  Service: Cardiovascular;  Laterality: Right;   PERIPHERAL VASCULAR CATHETERIZATION Right 01/25/2016   Procedure: Lower Extremity Angiography;  Surgeon: Katha Cabal, MD;  Location: [place] [facility];  Service: Cardiovascular;  Laterality: Right;   TOE AMPUTATION     TONSILLECTOMY       Social History:   reports that he quit smoking about 6 years ago. His smoking use included cigarettes. He has a 22.50 pack-year smoking history. He has never used smokeless tobacco. He reports that he does not currently use alcohol after a past usage of about 3.0 standard drinks of alcohol per week. He reports that he does not use drugs.    Family History:  His family history includes Alcohol abuse in his father; Coronary artery disease in his father; Heart failure in his brother; Hypertension in his father.   Allergies Allergies  Allergen Reactions   Penicillins Other (See Comments)    Happened at 65 years old and pt. stated he passed out  He has tolerated amoxicillin/clavulanate and ampicillin/sulbactam   Statins     Other reaction(s): Muscle Pain Causes legs to ache per pt     Home Medications  Prior to Admission medications   Medication Sig Start Date End Date Taking? Authorizing Provider  amiodarone (PACERONE) 200 MG tablet Take 200 mg by mouth daily.   Yes [provider]  amoxicillin-clavulanate (AUGMENTIN) 875-125 MG tablet Take 1 tablet by mouth 2 (two) times daily. 09/18/21  Yes Tsosie Billing, MD  apixaban (ELIQUIS) 5 MG TABS tablet Take 1 tablet (5 mg total) by mouth 2 (two) times daily. 01/26/16  Yes Stegmayer, Janalyn Harder, PA-C  aspirin EC 81 MG EC tablet Take 1 tablet (81 mg total) by mouth daily. Swallow whole. 05/17/21  Yes Dessa Phi, DO  Cholecalciferol 25 MCG (1000 UT) tablet Take 1,000 Units by mouth daily.   Yes [provider]  Cyanocobalamin (VITAMIN B-12) 5000 MCG TBDP Take 10,000 mcg by mouth daily.   Yes [provider]  diltiazem (CARDIZEM CD) 300 MG 24 hr capsule Take 1 capsule (300 mg total) by mouth daily. 01/16/21  Yes Bonnielee Haff, MD  ezetimibe (ZETIA) 10 MG tablet Take 10 mg by mouth at bedtime.   Yes [provider]  ferrous sulfate 325 (65 FE) MG tablet Take 325 mg by mouth daily with breakfast.   Yes [provider]  gabapentin (NEURONTIN) 300 MG capsule Take 300 mg by mouth at bedtime. 12/02/20  Yes [provider]  HUMALOG 100 UNIT/ML injection Inject 15 Units into the skin in the morning and at bedtime. Sliding scale 08/18/21  Yes [provider]  insulin glargine (LANTUS) 100  UNIT/ML Solostar Pen Inject 25 Units  into the skin in the morning and at bedtime. 08/11/21  Yes Fritzi Mandes, MD  isosorbide mononitrate (IMDUR) 60 MG 24 hr tablet Take 60 mg by mouth 2 (two) times daily. 04/21/21  Yes [provider]  losartan (COZAAR) 50 MG tablet Take 50 mg by mouth daily.   Yes [provider]  metFORMIN (GLUCOPHAGE) 1000 MG tablet Take 1,000 mg by mouth 2 (two) times daily.  12/02/17  Yes [provider]  metoprolol tartrate (LOPRESSOR) 50 MG tablet Take 100 mg by mouth 2 (two) times daily. 04/21/21  Yes [provider]  Multiple Vitamins-Minerals (MULTIVITAMIN WITH MINERALS) tablet Take 1 tablet by mouth daily.   Yes [provider]  nitroGLYCERIN (NITROSTAT) 0.4 MG SL tablet Place 0.4 mg under the tongue every 5 (five) minutes as needed for chest pain.   Yes [provider]  pramipexole (MIRAPEX) 1 MG tablet Take 2 mg by mouth at bedtime. 11/09/20  Yes [provider]  primidone (MYSOLINE) 250 MG tablet Take 250 mg by mouth 2 (two) times daily. 01/23/20  Yes [provider]  tamsulosin (FLOMAX) 0.4 MG CAPS capsule Take 0.4 mg by mouth daily.   Yes [provider]  torsemide (DEMADEX) 20 MG tablet Take 3 tablets (60 mg total) by mouth daily. 09/26/21  Yes Hackney, Otila Kluver A, FNP  traMADol (ULTRAM) 50 MG tablet Take 1 tablet (50 mg total) by mouth daily as needed for moderate pain. 08/11/21  Yes Fritzi Mandes, MD  vitamin C (ASCORBIC ACID) 500 MG tablet Take 500 mg by mouth daily.   Yes [provider]  zinc gluconate 50 MG tablet Take 50 mg by mouth daily.   Yes [provider]  Continuous Blood Gluc Sensor (FREESTYLE LIBRE 2 SENSOR) MISC  09/08/21   [provider]     Critical care time: 82 minutes      Donell Beers, [place] Pager [phone] (please enter 7 digits) PCCM Consult Pager [phone] (please enter 7 digits)

## 2021-10-24 NOTE — Progress Notes (Addendum)
Progress Note   Patient: [name]. ZOX:[id number] DOB: 1955/11/27 DOA: 09/28/2021     26 DOS: the patient was seen and examined on 10/24/2021   Brief hospital course: [name]. is a 66 y.o. male with medical history significant for chronic diastolic dysfunction CHF with LVEF of 50 to 55%, hypertension, coronary artery disease, depression, COPD, A-fib, morbid obesity (BMI 49), obstructive sleep apnea, status post recent wound debridement and partial calcanectomy (05/16) currently on oral antibiotic therapy after completion of IV who presents to the ER for evaluation of bilateral lower extremity swelling as well as increased redness and swelling involving the left lower extremity mostly over the heel. MRI of the foot showed left foot osteomyelitis, placed on vancomycin and Zosyn. Blood culture positive for MRSA secondary to osteomyelitis.   Left BKA performed on 6/6.  Due to volume overload due to acute on chronic diastolic congestive heart failure with secondary hypoxic respiratory failure.  Started IV Lasix 6/6 through 6/17.  Had been transitioned to oral torsemide, however due to persistent hypoxia and worsening chest x-ray with new pleural effusion and pulmonary edema, resumed on IV Lasix at 60 mg twice daily (p.m. on 6/18).  Currently patient on daptomycin as per ID to treat MRSA sepsis and will need to be on for a total of 4 weeks.  Repeat blood cultures negative.  Acute delirium during the entire hospitalization.  Mental status contniues to wax and wane.  Answering questions appropriately.  Continue Seroquel at night and BiPAP at night.  Foley catheter placed on 6/11 secondary to urinary retention.  Flomax increased to twice a day dosing and added Proscar.  Foley removed for voiding trial on 6/16, retention appears resolved.  Voiding well without significant post-void residuals per nursing.  For the week of 6/21 through 6/27.  His mental status waxed and waned  throughout the entire week.  He required to be on BiPAP more often and even during the day.  Urine culture positive for Citrobacter which was resistant to the Rocephin that I had him on for 3 days.  Initially gave Bactrim this morning but with his respiratory status worsened to where he needed heated high flow nasal cannula also during the day.  We will order a thoracentesis and get more aggressive with antibiotics.  Added cefepime.  Foley catheter needed to be replaced again secondary to urinary retention.  We will also switch back to IV Lasix 60 mg IV twice daily.  We will transfer to stepdown unit for closer monitoring of respiratory status.  We will get critical care consultation.   Assessment and Plan: * Sepsis due to methicillin resistant Staphylococcus aureus (MRSA) (HCC) MRSA growing out of blood cultures on 09/28/2021.  Repeat blood cultures negative.  Continue daptomycin.  Source of infection likely osteomyelitis.  Will need 4 weeks total of antibiotics total.  UTI (urinary tract infection) Citrobacter growing out of urine culture.  Resistant to the Rocephin that I gave for 3 days.  I started Bactrim this morning but since the patient had low-grade fever and possible pleural effusion I will change to cefepime.  Acute on chronic respiratory failure with hypoxia and hypercapnia (HCC) Requires BiPAP at night, in order to to prevent CO2 narcosis and encephalopathy. Patient on heated high flow nasal cannula during the day and intermittent BiPAP.  Requires BiPAP at night.  Patient is more hypoxic and requiring more oxygen during the day.  Has slight pleural effusion.  Will order thoracentesis to see if  there is enough fluid to draw off.     Acute delirium Mental status has waxed and waned through the entire hospital course.  Continue BiPAP at night to blow off CO2.  Repeat CT scan of the head negative.  Low-dose risperidone during the day and as needed Seroquel at night.  We will hold gabapentin  again.  We will get psychiatric evaluation.  Acute on chronic diastolic CHF (congestive heart failure) (HCC) Patient's last known LVEF is 55 to 60% from a 2D echocardiogram which was done 01/23.  -We will change back to IV Lasix 60 mg IV twice daily secondary to respiratory status    Uncontrolled type 2 diabetes mellitus with hypoglycemia, with long-term current use of insulin (HCC) Uncontrolled.  Last hemoglobin A1c in the chart of 10.2.  With mental status waxing and waning I had to decrease insulin dosing to nighttime dosing 21 units.  Continue short acting insulin prior to meals  AF (paroxysmal atrial fibrillation) (HCC) Continue amiodarone, Cardizem CD and metoprolol Eliquis for anticoagulation.     Obesity, Class III, BMI 40-49.9 (morbid obesity) (HCC) BMI 42.19 Complicates overall care and prognosis.  Recommend lifestyle modifications including physical activity and diet for weight loss and overall long-term health.   Osteomyelitis (HCC) Status post left BKA on 10/03/2021. Patient on IV daptomycin.  I messaged Dr. Gilda Crease surgery that has been 3 weeks since the BKA and likely can remove the staples.    Essential hypertension, benign Continue metoprolol, Cardizem and torsemide  RLS (restless legs syndrome) Hold Mirapex.  Lower dose gabapentin at night  Acute urinary retention Foley catheter placed on 10/08/2021.  Foley DC'd on 6/16.  Foley needed to be replaced on 10/24/2021  Iron deficiency anemia With ferritin of 22 this is iron deficiency anemia.  Last hemoglobin 9.0  Hyperkalemia Resolved with Lokelma on 6/17, 6/19. Monitor BMP.  OSA (obstructive sleep apnea) Previously using CPAP at night. Now on BiPAP secondary to CO2 retention.  Qualifies for NIV with elevated PCO2 on ABG.  Acute respiratory failure with hypoxia and hypercapnia (HCC) See above  Generalized weakness Rehab recommended by PT.  Unfortunately no rehab beds.  Try to get out of bed to  chair.  Malnutrition of moderate degree Related to acute illness (left heel osteomyelitis) and energy intake of less than 75% for more than 7 days, mild fat depletion.  Continue Glucerna and ensures, vitamins        Subjective: Patient answers a few questions and follows some simple commands.  Able to straight leg raise with his right leg.  Initially admitted 26 days ago with osteomyelitis and sepsis status post left BKA.  Physical Exam: Vitals:   10/24/21 0858 10/24/21 1100 10/24/21 1458 10/24/21 1500  BP: 116/66 93/71 98/70  (!) 108/92  Pulse: (!) 121 (!) 118 (!) 113 (!) 114  Resp: 12 20 (!) 21 (!) 24  Temp:  98.2 F (36.8 C) 99.7 F (37.6 C) 99.9 F (37.7 C)  TempSrc:  Axillary Axillary Axillary  SpO2: 96% 97% 99% 95%  Weight:      Height:       Physical Exam HENT:     Head: Normocephalic.  Eyes:     General: Lids are normal.     Conjunctiva/sclera: Conjunctivae normal.  Cardiovascular:     Rate and Rhythm: Tachycardia present. Rhythm irregularly irregular.     Heart sounds: Normal heart sounds, S1 normal and S2 normal.  Pulmonary:     Breath sounds: No decreased breath sounds, wheezing,  rhonchi or rales.  Abdominal:     Palpations: Abdomen is soft.     Tenderness: There is no abdominal tenderness.  Musculoskeletal:     Right lower leg: Edema present.     Left Lower Extremity: Left leg is amputated below knee.  Skin:    General: Skin is warm.     Comments: Chronic lower extremity skin discoloration right lower leg.   Neurological:     Mental Status: He is alert.     Comments: The patient was on BiPAP when I saw him.  Answers some questions but not as talkative today.     Data Reviewed: ABG this morning showed a pH of 7.48, PCO2 of 64 PO2 of 63 on BiPAP 50% Creatinine 1.06, CO2 40, chloride 93, hemoglobin 9.0, white blood cell count 10.3, platelet count 192  Family Communication: Updated patient's wife on the phone  Disposition: Status is:  Inpatient Remains inpatient appropriate because: Patient with increasing oxygen requirements during the day.  No plan for disposition at this point  Planned Discharge Destination: Rehab  We will get palliative care and psychiatric consultations.  We will run case by critical care team.  Author: Alford Highland, MD 10/24/2021 4:44 PM  For on call review [email or web address].

## 2021-10-24 NOTE — Progress Notes (Addendum)
RT called to pt bedside. PT has removed O2 probe and Oxygen for unknown amount of time. HHFNC reapplied in addition to non-rebreather mask. PT lips blue with shallow respirations. RT has consulted critical care physician at this time and hospitalist also notified. Bedside sitter ordered with BiPAP. mitts applied to upper extremities. Will continue to assess.

## 2021-10-24 NOTE — Progress Notes (Signed)
Transfer to stepdown unit for closer monitoring of respiratory status.  Case discussed with critical care specialist.  Case discussed with ID and will get an MRSA PCR swab.  If positive will need to change daptomycin to vancomycin.  If negative can keep the daptomycin.  Dr. Alford Highland

## 2021-10-25 ENCOUNTER — Inpatient Hospital Stay: Payer: Medicare Other

## 2021-10-25 DIAGNOSIS — G9341 Metabolic encephalopathy: Secondary | ICD-10-CM | POA: Diagnosis not present

## 2021-10-25 DIAGNOSIS — J9601 Acute respiratory failure with hypoxia: Secondary | ICD-10-CM | POA: Diagnosis not present

## 2021-10-25 DIAGNOSIS — Z515 Encounter for palliative care: Secondary | ICD-10-CM

## 2021-10-25 DIAGNOSIS — J9602 Acute respiratory failure with hypercapnia: Secondary | ICD-10-CM | POA: Diagnosis not present

## 2021-10-25 DIAGNOSIS — Z789 Other specified health status: Secondary | ICD-10-CM

## 2021-10-25 DIAGNOSIS — A4102 Sepsis due to Methicillin resistant Staphylococcus aureus: Secondary | ICD-10-CM | POA: Diagnosis not present

## 2021-10-25 DIAGNOSIS — R6520 Severe sepsis without septic shock: Secondary | ICD-10-CM | POA: Diagnosis not present

## 2021-10-25 DIAGNOSIS — J9621 Acute and chronic respiratory failure with hypoxia: Secondary | ICD-10-CM | POA: Diagnosis not present

## 2021-10-25 DIAGNOSIS — I5033 Acute on chronic diastolic (congestive) heart failure: Secondary | ICD-10-CM | POA: Diagnosis not present

## 2021-10-25 DIAGNOSIS — R410 Disorientation, unspecified: Secondary | ICD-10-CM | POA: Diagnosis not present

## 2021-10-25 DIAGNOSIS — I509 Heart failure, unspecified: Secondary | ICD-10-CM | POA: Diagnosis not present

## 2021-10-25 LAB — BODY FLUID CELL COUNT WITH DIFFERENTIAL
Eos, Fluid: 1 %
Lymphs, Fluid: 24 %
Monocyte-Macrophage-Serous Fluid: 37 %
Neutrophil Count, Fluid: 38 %
Total Nucleated Cell Count, Fluid: 851 cu mm

## 2021-10-25 LAB — CBC
HCT: 29.8 % — ABNORMAL LOW (ref 39.0–52.0)
Hemoglobin: 8.6 g/dL — ABNORMAL LOW (ref 13.0–17.0)
MCH: 27.4 pg (ref 26.0–34.0)
MCHC: 28.9 g/dL — ABNORMAL LOW (ref 30.0–36.0)
MCV: 94.9 fL (ref 80.0–100.0)
Platelets: 203 10*3/uL (ref 150–400)
RBC: 3.14 MIL/uL — ABNORMAL LOW (ref 4.22–5.81)
RDW: 17.1 % — ABNORMAL HIGH (ref 11.5–15.5)
WBC: 9.3 10*3/uL (ref 4.0–10.5)
nRBC: 0 % (ref 0.0–0.2)

## 2021-10-25 LAB — BASIC METABOLIC PANEL
Anion gap: 12 (ref 5–15)
BUN: 50 mg/dL — ABNORMAL HIGH (ref 8–23)
CO2: 38 mmol/L — ABNORMAL HIGH (ref 22–32)
Calcium: 8.4 mg/dL — ABNORMAL LOW (ref 8.9–10.3)
Chloride: 90 mmol/L — ABNORMAL LOW (ref 98–111)
Creatinine, Ser: 1.34 mg/dL — ABNORMAL HIGH (ref 0.61–1.24)
GFR, Estimated: 58 mL/min — ABNORMAL LOW (ref >60)
Glucose, Bld: 166 mg/dL — ABNORMAL HIGH (ref 70–99)
Potassium: 4.1 mmol/L (ref 3.5–5.1)
Sodium: 140 mmol/L (ref 135–145)

## 2021-10-25 LAB — GLUCOSE, CAPILLARY
Glucose-Capillary: 163 mg/dL — ABNORMAL HIGH (ref 70–99)
Glucose-Capillary: 206 mg/dL — ABNORMAL HIGH (ref 70–99)
Glucose-Capillary: 380 mg/dL — ABNORMAL HIGH (ref 70–99)

## 2021-10-25 LAB — TROPONIN I (HIGH SENSITIVITY): Troponin I (High Sensitivity): 13 ng/L (ref <18)

## 2021-10-25 LAB — LACTATE DEHYDROGENASE, PLEURAL OR PERITONEAL FLUID: LD, Fluid: 83 U/L — ABNORMAL HIGH (ref 3–23)

## 2021-10-25 LAB — PROTEIN, PLEURAL OR PERITONEAL FLUID: Total protein, fluid: <3 g/dL

## 2021-10-25 LAB — PHOSPHORUS: Phosphorus: 4.1 mg/dL (ref 2.5–4.6)

## 2021-10-25 LAB — MAGNESIUM: Magnesium: 2.5 mg/dL — ABNORMAL HIGH (ref 1.7–2.4)

## 2021-10-25 MED ORDER — SODIUM CHLORIDE 0.9 % IV SOLN
250.0000 mL | INTRAVENOUS | Status: DC
  Administered 2021-10-25: 250 mL via INTRAVENOUS

## 2021-10-25 MED ORDER — METOPROLOL TARTRATE 5 MG/5ML IV SOLN
2.5000 mg | Freq: Four times a day (QID) | INTRAVENOUS | Status: DC | PRN
  Administered 2021-11-09: 2.5 mg via INTRAVENOUS
  Filled 2021-10-25 (×2): qty 5

## 2021-10-25 MED ORDER — STERILE WATER FOR INJECTION IJ SOLN
INTRAMUSCULAR | Status: AC
  Filled 2021-10-25: qty 10

## 2021-10-25 MED ORDER — ALBUTEROL SULFATE (2.5 MG/3ML) 0.083% IN NEBU: 2.5000 mg | INHALATION_SOLUTION | RESPIRATORY_TRACT | Status: DC | PRN

## 2021-10-25 MED ORDER — NOREPINEPHRINE 4 MG/250ML-% IV SOLN
2.0000 ug/min | INTRAVENOUS | Status: DC
  Administered 2021-10-25: 2 ug/min via INTRAVENOUS
  Administered 2021-10-26: 5 ug/min via INTRAVENOUS
  Filled 2021-10-25 (×2): qty 250

## 2021-10-25 MED ORDER — INSULIN ASPART 100 UNIT/ML IJ SOLN
8.0000 [IU] | Freq: Three times a day (TID) | INTRAMUSCULAR | Status: DC
  Administered 2021-10-25 – 2021-10-27 (×8): 8 [IU] via SUBCUTANEOUS
  Filled 2021-10-25 (×7): qty 1

## 2021-10-25 MED ORDER — MIDODRINE HCL 5 MG PO TABS
10.0000 mg | ORAL_TABLET | Freq: Three times a day (TID) | ORAL | Status: DC
Start: 2021-10-25 — End: 2021-10-28
  Administered 2021-10-25 – 2021-10-28 (×9): 10 mg via ORAL
  Filled 2021-10-25 (×8): qty 2

## 2021-10-25 MED ORDER — IPRATROPIUM-ALBUTEROL 0.5-2.5 (3) MG/3ML IN SOLN
3.0000 mL | Freq: Four times a day (QID) | RESPIRATORY_TRACT | Status: DC
  Administered 2021-10-25 – 2021-10-27 (×8): 3 mL via RESPIRATORY_TRACT
  Filled 2021-10-25 (×8): qty 3

## 2021-10-25 MED ORDER — METHYLPREDNISOLONE SODIUM SUCC 40 MG IJ SOLR
40.0000 mg | Freq: Every day | INTRAMUSCULAR | Status: DC
  Administered 2021-10-25 – 2021-10-26 (×2): 40 mg via INTRAVENOUS
  Filled 2021-10-25 (×2): qty 1

## 2021-10-25 MED ORDER — LACTATED RINGERS IV BOLUS
250.0000 mL | Freq: Once | INTRAVENOUS | Status: AC
  Administered 2021-10-25: 250 mL via INTRAVENOUS

## 2021-10-25 NOTE — IPAL (Signed)
  Interdisciplinary Goals of Care Family Meeting   Date carried out: 10/25/2021  Location of the meeting: Bedside  Member's involved: Physician, Bedside Registered Nurse, and Family Member or next of kin    GOALS OF CARE DISCUSSION  The Clinical status was relayed to family in detail-Wife at bedside  Updated and notified of patients medical condition-    Explained to family course of therapy and the modalities   Patient with Progressive multiorgan failure with a very high probablity of a very minimal chance of meaningful recovery despite all aggressive and optimal medical therapy.   PATIENT REMAINS FULL CODE  Family understands the situation. Continue fio2 as needed BiPAP as needed ABX as prescribed On PRESSORS, stop beta and Ca blockers Wife recommend Cardiology  Family are satisfied with Plan of action and management. All questions answered  Additional CC time 25 mins   Terral Cooks Patricia Pesa, M.D.  [facility]  Medical Director [place] Director [facility] Cardio-Pulmonary Department

## 2021-10-25 NOTE — Consult Note (Signed)
Consultation Note Date: 10/25/2021   Patient Name: [name].  DOB: July 24, 1955  MRN: [id number]  Age / Sex: 66 y.o., male  PCP: [name], MD Referring Physician: Flora Lipps, MD  Reason for Consultation: Establishing goals of care  HPI/Patient Profile: 66 y.o. male  with past medical history of HLD, CAD (echo 10/05/2021 with EF of 55 to 60%), HTN, HLD, MI, type 2 diabetes, and left lower extremity osteomyelitis admitted on 09/28/2021 with sepsis due to MRSA.  Patient is being treated for acute on chronic hypoxic respiratory failure secondary to acute diastolic CHF exacerbation, pneumonia, and possible atelectasis or right-sided pleural effusion.  On 10/03/2021 patient had left BKA.  On 6/28, patient had right-sided thoracentesis with removal of 1.2 L of fluid.  Patient remains in ITU due to increased demands for supplemental oxygen.  Palliative medicine team was consulted to discuss goals of care.  Clinical Assessment and Goals of Care: I have reviewed medical records including EPIC notes, labs and imaging, assessed the patient and then met with patient and his wife Judeen Hammans at bedside to discuss diagnosis prognosis, [place], EOL wishes, disposition and options.  I introduced Palliative Medicine as specialized medical care for people living with serious illness. It focuses on providing relief from the symptoms and stress of a serious illness. The goal is to improve quality of life for both the patient and the family.  We discussed a brief life review of the patient.  Patient and Judeen Hammans have been married for 2 years but have been together for just about 10 years and have known each other since high school.  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  They have no children together.  Patient had 2 daughters from his previous marriage, both of whom have passed  away.  Patient has 2 grandchildren from his deceased daughter Jonelle Sidle.  Patient speaks fondly of his granddaughter Ok Edwards.  As far as functional and nutritional status patient has been in and out of rehab and struggling with ambulation due to lower extremity wounds.  Judeen Hammans says patient has a healthy appetite when he is assisted with eating/feeding.  We discussed patient's current illness and what it means in the larger context of patient's on-going co-morbidities. I attempted to elicit values and goals of care important to the patient.  Patient is too confused to participate in the goals of care discussion.  Patient's wife shares that she wants everything done to keep him alive.  She says she is worried and concerned about him but is not ready to let him go.    CODE STATUS discussed.  In the event of a cardiopulmonary arrest patient's wife endorses that she would want CPR, defibrillation, intubation, and use of mechanical ventilation.  Full code remains. Judeen Hammans is accepting of all offered, available, appropriate medical interventions to sustain patient's life.  Therapeutic silence and active listening provided for Judeen Hammans to share her thoughts and emotions regarding patient's current health status.  Judeen Hammans is  attentive to patient's needs and has a realistic understanding of his current health status, poor prognosis, and high risk for intubation.   Discussed with patient/Sherry the importance of continued conversation with family and the medical providers regarding overall plan of care and treatment options, ensuring decisions are within the context of the patient's values and GOCs.    Questions and concerns were addressed. The family was encouraged to call with questions or concerns.  I am on service and plan on rounding on the patient tomorrow.  Primary Decision Maker NEXT OF KIN  Code Status/Advance Care Planning: Full code  Prognosis:   Unable to determine  Discharge Planning: To Be  Determined  Primary Diagnoses: Present on Admission:  Osteomyelitis ([place])  Acute on chronic diastolic CHF (congestive heart failure) (HCC)  RLS (restless legs syndrome)  Obesity, Class III, BMI 40-49.9 (morbid obesity) (HCC)  Essential hypertension, benign  AF (paroxysmal atrial fibrillation) (HCC)  Malnutrition of moderate degree  Acute respiratory failure with hypoxia and hypercapnia (HCC)  OSA (obstructive sleep apnea)  Acute on chronic respiratory failure with hypoxia and hypercapnia ([place])   Physical Exam Vitals reviewed.  Constitutional:      Appearance: He is obese. He is ill-appearing.  HENT:     Head: Normocephalic.     Mouth/Throat:     Mouth: Mucous membranes are moist.  Eyes:     Pupils: Pupils are equal, round, and reactive to light.  Cardiovascular:     Rate and Rhythm: Tachycardia present.  Pulmonary:     Comments: [place] in place Abdominal:     Palpations: Abdomen is soft.  Musculoskeletal:     Comments: Bedbound, weak  Skin:    General: Skin is warm.     Comments: UTA wounds of bilateral LE, dressing clean dry and intact  Neurological:     Mental Status: He is alert.     Comments: Oriented to self     Palliative Assessment/Data: 30%     I discussed this patient's plan of care with patient, patient's wife Judeen Hammans, Therapist, sports.  Thank you for this consult. Palliative medicine will continue to follow and assist holistically.   Time Total: 75 minutes Greater than 50%  of this time was spent counseling and coordinating care related to the above assessment and plan.  Signed by: Jordan Hawks, DNP, FNP-BC Palliative Medicine    Please contact Palliative Medicine Team phone at [phone] for questions and concerns.  For individual provider: See Shea Evans

## 2021-10-25 NOTE — Inpatient Diabetes Management (Signed)
Inpatient Diabetes Program Recommendations  AACE/ADA: New Consensus Statement on Inpatient Glycemic Control   Target Ranges:  Prepandial:   less than 140 mg/dL      Peak postprandial:   less than 180 mg/dL (1-2 hours)      Critically ill patients:  140 - 180 mg/dL    Latest Reference Range & Units 10/24/21 07:11 10/24/21 11:12 10/24/21 15:55 10/24/21 18:39 10/24/21 22:35 10/25/21 08:15  Glucose-Capillary 70 - 99 mg/dL 140 (H) 256 (H) 318 (H) 231 (H) 175 (H) 163 (H)   Review of Glycemic Control  Diabetes history: DM2 Outpatient Diabetes medications: Lantus 25 units BID, Humalog 15 units BID, Metformin 1000 mg BID Current orders for Inpatient glycemic control: Semglee 21 units QHS, Novolog 4 units TID with meals, Novolog 0-20 units TID with meals  Inpatient Diabetes Program Recommendations:    Insulin:  Please consider increasing meal coverage to Novolog 8 units TID with meals.  Thanks, Barnie Alderman, RN, MSN, [place] Diabetes Coordinator Inpatient Diabetes Program [phone] (Team Pager from 8am to [place])

## 2021-10-25 NOTE — Consult Note (Signed)
[facility] Consult   Reason for Consult: Consult for 66 year old man during a extended hospitalization with recent confusion and change in mental state Referring Physician: Kasa Patient Identification: [name]. MRN:  [id number] Principal Diagnosis: Acute delirium Diagnosis:  Principal Problem:   Acute delirium Active Problems:   Essential hypertension, benign   RLS (restless legs syndrome)   Osteomyelitis (HCC)   Obesity, Class III, BMI 40-49.9 (morbid obesity) (HCC)   Acute on chronic diastolic CHF (congestive heart failure) (HCC)   AF (paroxysmal atrial fibrillation) ([place])   Uncontrolled type 2 diabetes mellitus with hypoglycemia, with long-term current use of insulin (HCC)   Malnutrition of moderate degree   Generalized weakness   Acute respiratory failure with hypoxia and hypercapnia (HCC)   OSA (obstructive sleep apnea)   Sepsis due to methicillin resistant Staphylococcus aureus (MRSA) (HCC)   Acute on chronic respiratory failure with hypoxia and hypercapnia (HCC)   Acute urinary retention   Hyperkalemia   Iron deficiency anemia   UTI (urinary tract infection)   Total Time spent with patient: 45 minutes  Subjective:   [name]. is a 66 y.o. male patient admitted with "I am at [facility]".  HPI: Patient seen and chart reviewed.  66 year old man with multiple acute and chronic medical problems who has had an extended hospitalization.  Spoke with the patient and also his wife.  Patient had had a decline in his mental state with worsening confusion over the last couple days.  This coincided with his need to move into the ICU again.  According to his wife yesterday he was very out of it and confused and not his normal self.  Today she reports that he is much better.  Not up to his normal self but greatly improved.  She said today he has some emotional tone to him and is acting more appropriately.  Patient was awake and cooperative with the exam.   Somewhat flat in his affect but did show some emotion getting tearful talking about his wife.  Patient was able to tell me he was in the hospital.  He was approximately right about the time of year but was never able to tell me the exact year.  When asked to explain his hospitalization he remembered that he had had something wrong with his foot but struggled to explain the rest of it.  Patient says he feels like his mood is pretty good.  He denies suicidal thoughts.  He denies any hallucinations.  No evidence of any failure to tolerate the antipsychotics that were started over the last couple days for managing delirium.  Past Psychiatric History: Patient says he thinks he did see a psychiatrist a long time ago but cannot quite remember what it was.  No evidence or report of psychiatric history in the chart.  Risk to Self:   Risk to Others:   Prior Inpatient Therapy:   Prior Outpatient Therapy:    Past Medical History:  Past Medical History:  Diagnosis Date   Arrhythmia    atrial fibrillation   Asthma    CHF (congestive heart failure) (HCC)    COPD (chronic obstructive pulmonary disease) (HCC)    Coronary artery disease    Depression    Diabetes mellitus without complication ([place])    Gout    History anabolic steroid use    Hyperlipidemia    Hypertension    Hypogonadism in male    Morbid obesity ([place])    Myocardial infarction ([place])  Peripheral vascular disease ([place])    Perirectal abscess    Pleurisy    Sleep apnea    CPAP at night, no oxygen   Varicella     Past Surgical History:  Procedure Laterality Date   ABDOMINAL AORTIC ANEURYSM REPAIR     ACHILLES TENDON SURGERY Left 01/10/2021   Procedure: ACHILLES LENGTHENING/KIDNER;  Surgeon: Caroline More, DPM;  Location: [facility] ORS;  Service: Podiatry;  Laterality: Left;   AMPUTATION Left 10/03/2021   Procedure: AMPUTATION BELOW KNEE;  Surgeon: Katha Cabal, MD;  Location: [facility] ORS;  Service: Vascular;  Laterality: Left;    AMPUTATION TOE Right 02/10/2016   Procedure: AMPUTATION TOE 3RD TOE;  Surgeon: Samara Deist, DPM;  Location: [facility] OR[facility];  Service: Podiatry;  Laterality: Right;   AMPUTATION TOE Left 02/24/2020   Procedure: AMPUTATION TOE;  Surgeon: Caroline More, DPM;  Location: [facility];  Service: Podiatry;  Laterality: Left;   APPLICATION OF WOUND VAC Left 02/29/2020   Procedure: APPLICATION OF WOUND VAC;  Surgeon: Caroline More, DPM;  Location: [facility];  Service: Podiatry;  Laterality: Left;   COLONOSCOPY WITH PROPOFOL [name] 11/18/2015   Procedure: COLONOSCOPY WITH PROPOFOL;  Surgeon: Manya Silvas, MD;  Location: [facility] END[facility]COPY;  Service: Endoscopy;  Laterality: [name];   CORONARY ARTERY BYPASS GRAFT     CORONARY STENT INTERVENTION [name] 02/02/2020   Procedure: CORONARY STENT INTERVENTION;  Surgeon: Isaias Cowman, MD;  Location: [place] [facility];  Service: Cardiovascular;  Laterality: [name];   INCISION AND DRAINAGE Left 08/07/2021   Procedure: INCISION AND DRAINAGE-Partial Calcanectomy;  Surgeon: Caroline More, DPM;  Location: [facility];  Service: Podiatry;  Laterality: Left;   IRRIGATION AND DEBRIDEMENT FOOT Left 02/29/2020   Procedure: IRRIGATION AND DEBRIDEMENT FOOT;  Surgeon: Caroline More, DPM;  Location: [facility];  Service: Podiatry;  Laterality: Left;   IRRIGATION AND DEBRIDEMENT FOOT Left 02/24/2020   Procedure: IRRIGATION AND DEBRIDEMENT FOOT;  Surgeon: Caroline More, DPM;  Location: [facility];  Service: Podiatry;  Laterality: Left;   KNEE ARTHROSCOPY     LEFT HEART CATH AND CORS/GRAFTS ANGIOGRAPHY [name] 02/02/2020   Procedure: LEFT HEART CATH AND CORS/GRAFTS ANGIOGRAPHY;  Surgeon: Teodoro Spray, MD;  Location: [place] [facility];  Service: Cardiovascular;  Laterality: [name];   LOWER EXTREMITY ANGIOGRAPHY Left 02/25/2020   Procedure: Lower Extremity Angiography;  Surgeon: Algernon Huxley, MD;  Location: [place] [facility];  Service: Cardiovascular;  Laterality: Left;   LOWER EXTREMITY ANGIOGRAPHY Left  01/04/2021   Procedure: LOWER EXTREMITY ANGIOGRAPHY;  Surgeon: Algernon Huxley, MD;  Location: [place] [facility];  Service: Cardiovascular;  Laterality: Left;   METATARSAL HEAD EXCISION Left 01/10/2021   Procedure: METATARSAL HEAD EXCISION - LEFT 5th;  Surgeon: Caroline More, DPM;  Location: [facility];  Service: Podiatry;  Laterality: Left;   PERIPHERAL VASCULAR CATHETERIZATION Right 01/24/2016   Procedure: Lower Extremity Angiography;  Surgeon: Katha Cabal, MD;  Location: [place] [facility];  Service: Cardiovascular;  Laterality: Right;   PERIPHERAL VASCULAR CATHETERIZATION Right 01/25/2016   Procedure: Lower Extremity Angiography;  Surgeon: Katha Cabal, MD;  Location: [place] [facility];  Service: Cardiovascular;  Laterality: Right;   TOE AMPUTATION     TONSILLECTOMY     Family History:  Family History  Problem Relation Age of Onset   Hypertension Father    Coronary artery disease Father    Alcohol abuse Father    Heart failure Brother    Family Psychiatric  History: Alcohol abuse in family  Social History:  Social History   Substance and Sexual Activity  Alcohol Use Not Currently   Alcohol/week: 3.0 standard drinks of alcohol   Types: 3 Glasses of wine per week     Social History   Substance and Sexual Activity  Drug Use No    Social History   Socioeconomic History   Marital status: Married    Spouse name: Not on file   Number of children: Not on file   Years of education: Not on file   Highest education level: Not on file  Occupational History   Not on file  Tobacco Use   Smoking status: Former    Packs/day: 0.50    Years: 45.00    Total pack years: 22.50    Types: Cigarettes    Quit date: 04/05/2015    Years since quitting: 6.5   Smokeless tobacco: Never  Vaping Use   Vaping Use: Every day  Substance and Sexual Activity   Alcohol use: Not Currently    Alcohol/week: 3.0 standard drinks of alcohol    Types: 3 Glasses of wine per week   Drug use: No    Sexual activity: Not on file  Other Topics Concern   Not on file  Social History Narrative   Not on file   Social Determinants of Health   Financial Resource Strain: Not on file  Food Insecurity: Not on file  Transportation Needs: Not on file  Physical Activity: Not on file  Stress: Not on file  Social Connections: Not on file   Additional Social History:    Allergies:   Allergies  Allergen Reactions   Penicillins Other (See Comments)    Happened at 66 years old and pt. stated he passed out  He has tolerated amoxicillin/clavulanate and ampicillin/sulbactam   Statins     Other reaction(s): Muscle Pain Causes legs to ache per pt    Labs:  Results for orders placed or performed during the hospital encounter of 09/28/21 (from the past 48 hour(s))  Glucose, capillary     Status: Abnormal   Collection Time: 10/23/21  4:16 PM  Result Value Ref Range   Glucose-Capillary 133 (H) 70 - 99 mg/dL    Comment: Glucose reference range applies only to samples taken after fasting for at least 8 hours.  Glucose, capillary     Status: Abnormal   Collection Time: 10/23/21  8:24 PM  Result Value Ref Range   Glucose-Capillary 139 (H) 70 - 99 mg/dL    Comment: Glucose reference range applies only to samples taken after fasting for at least 8 hours.  Blood gas, arterial     Status: Abnormal   Collection Time: 10/23/21  8:33 PM  Result Value Ref Range   FIO2 70 %   Delivery systems BILEVEL POSITIVE AIRWAY PRESSURE    Inspiratory PAP 16 cmH2O   Expiratory PAP 8 cmH2O   pH, Arterial 7.46 (H) 7.35 - 7.45   pCO2 arterial 69 (HH) 32 - 48 mmHg    Comment: CRITICAL RESULT CALLED TO, READ BACK BY AND VERIFIED WITH: KATY FOUST NP [phone] [id number]    pO2, Arterial 74 (L) 83 - 108 mmHg   Bicarbonate 49.1 (H) 20.0 - 28.0 mmol/L   Acid-Base Excess 21.9 (H) 0.0 - 2.0 mmol/L   O2 Saturation 96 %   Patient temperature 37.0    Collection site RIGHT RADIAL    Allens test (pass/fail) PASS PASS     Comment: Performed at [facility], [address] [place], [place] [address]  Blood gas, arterial     Status: Abnormal   Collection Time: 10/24/21  4:34 AM  Result Value Ref Range   FIO2 50 %   Delivery systems BILEVEL POSITIVE AIRWAY PRESSURE    Inspiratory PAP 16 cmH2O   Expiratory PAP 8 cmH2O   pH, Arterial 7.48 (H) 7.35 - 7.45   pCO2 arterial 64 (H) 32 - 48 mmHg   pO2, Arterial 63 (L) 83 - 108 mmHg   Bicarbonate 47.7 (H) 20.0 - 28.0 mmol/L   Acid-Base Excess 21.4 (H) 0.0 - 2.0 mmol/L   O2 Saturation 93.8 %   Patient temperature 37.0    Collection site RIGHT RADIAL    Allens test (pass/fail) PASS PASS    Comment: Performed at [facility], [address] [place], [place] [address]  Basic metabolic panel     Status: Abnormal   Collection Time: 10/24/21  6:38 AM  Result Value Ref Range   Sodium 143 135 - 145 mmol/L   Potassium 4.3 3.5 - 5.1 mmol/L   Chloride 93 (L) 98 - 111 mmol/L   CO2 40 (H) 22 - 32 mmol/L   Glucose, Bld 136 (H) 70 - 99 mg/dL    Comment: Glucose reference range applies only to samples taken after fasting for at least 8 hours.   BUN 40 (H) 8 - 23 mg/dL   Creatinine, Ser 1.06 0.61 - 1.24 mg/dL   Calcium 8.4 (L) 8.9 - 10.3 mg/dL   GFR, Estimated >60 >60 mL/min    Comment: (NOTE) Calculated using the CKD-EPI Creatinine Equation (2021)    Anion gap 10 5 - 15    Comment: Performed at [facility], [address] [place], [place] [address]  CBC     Status: Abnormal   Collection Time: 10/24/21  6:38 AM  Result Value Ref Range   WBC 10.3 4.0 - 10.5 K/uL   RBC 3.25 (L) 4.22 - 5.81 MIL/uL   Hemoglobin 9.0 (L) 13.0 - 17.0 g/dL   HCT 30.6 (L) 39.0 - 52.0 %   MCV 94.2 80.0 - 100.0 fL   MCH 27.7 26.0 - 34.0 pg   MCHC 29.4 (L) 30.0 - 36.0 g/dL   RDW 17.1 (H) 11.5 - 15.5 %   Platelets 192 150 - 400 K/uL   nRBC 0.0 0.0 - 0.2 %    Comment: Performed at [facility], [address] [place],  [address]  Glucose,  capillary     Status: Abnormal   Collection Time: 10/24/21  7:11 AM  Result Value Ref Range   Glucose-Capillary 140 (H) 70 - 99 mg/dL    Comment: Glucose reference range applies only to samples taken after fasting for at least 8 hours.  Glucose, capillary     Status: Abnormal   Collection Time: 10/24/21 11:12 AM  Result Value Ref Range   Glucose-Capillary 256 (H) 70 - 99 mg/dL    Comment: Glucose reference range applies only to samples taken after fasting for at least 8 hours.  Glucose, capillary     Status: Abnormal   Collection Time: 10/24/21  3:55 PM  Result Value Ref Range   Glucose-Capillary 318 (H) 70 - 99 mg/dL    Comment: Glucose reference range applies only to samples taken after fasting for at least 8 hours.   Comment 1 Notify RN   Blood gas, arterial     Status: Abnormal   Collection Time: 10/24/21  6:04 PM  Result  Value Ref Range   FIO2 0.80 %   Delivery systems HI FLOW NASAL CANNULA    pH, Arterial 7.45 7.35 - 7.45   pCO2 arterial 74 (HH) 32 - 48 mmHg    Comment: CRITICAL RESULT CALLED TO, READ BACK BY AND VERIFIED WITH:  DAVID KASA AT 9518, 10/24/2021, LS    pO2, Arterial 60 (L) 83 - 108 mmHg   Bicarbonate 51.4 (H) 20.0 - 28.0 mmol/L   Acid-Base Excess 22.6 (H) 0.0 - 2.0 mmol/L   O2 Saturation 91.5 %   Patient temperature 37.0    Allens test (pass/fail) PASS PASS    Comment: Performed at [facility], [address] [place], [place] [address]  Procalcitonin - Baseline     Status: None   Collection Time: 10/24/21  6:06 PM  Result Value Ref Range   Procalcitonin 0.27 ng/mL    Comment:        Interpretation: PCT (Procalcitonin) <= 0.5 ng/mL: Systemic infection (sepsis) is not likely. Local bacterial infection is possible. (NOTE)       Sepsis PCT Algorithm           Lower Respiratory Tract                                      Infection PCT Algorithm    ----------------------------     ----------------------------         PCT < 0.25 ng/mL                 PCT < 0.10 ng/mL          Strongly encourage             Strongly discourage   discontinuation of antibiotics    initiation of antibiotics    ----------------------------     -----------------------------       PCT 0.25 - 0.50 ng/mL            PCT 0.10 - 0.25 ng/mL               OR       >80% decrease in PCT            Discourage initiation of                                            antibiotics      Encourage discontinuation           of antibiotics    ----------------------------     -----------------------------         PCT >= 0.50 ng/mL              PCT 0.26 - 0.50 ng/mL               AND        <80% decrease in PCT             Encourage initiation of                                             antibiotics       Encourage continuation           of antibiotics    ----------------------------     -----------------------------  PCT >= 0.50 ng/mL                  PCT > 0.50 ng/mL               AND         increase in PCT                  Strongly encourage                                      initiation of antibiotics    Strongly encourage escalation           of antibiotics                                     -----------------------------                                           PCT <= 0.25 ng/mL                                                 OR                                        > 80% decrease in PCT                                      Discontinue / Do not initiate                                             antibiotics  Performed at [facility], [address] [place], [place] [address]   Lactic acid, plasma     Status: None   Collection Time: 10/24/21  6:06 PM  Result Value Ref Range   Lactic Acid, Venous 0.9 0.5 - 1.9 mmol/L    Comment: Performed at [facility], [address], [place], [place] [address]  MRSA Next Gen by PCR, Nasal     Status: None   Collection Time: 10/24/21  6:15 PM   Specimen: Nasal Mucosa; Nasal Swab  Result  Value Ref Range   MRSA by PCR Next Gen NOT DETECTED NOT DETECTED    Comment: (NOTE) The GeneXpert MRSA Assay (FDA approved for NASAL specimens only), is one component of a comprehensive MRSA colonization surveillance program. It is not intended to diagnose MRSA infection nor to guide or monitor treatment for MRSA infections. Test performance is not FDA approved in patients Wood than 67 years old. Performed at [facility], [address] [place], [place] [address]   Glucose, capillary     Status: Abnormal   Collection Time: 10/24/21  6:39 PM  Result Value Ref Range   Glucose-Capillary 231 (H) 70 - 99 mg/dL  Comment: Glucose reference range applies only to samples taken after fasting for at least 8 hours.  Glucose, capillary     Status: Abnormal   Collection Time: 10/24/21 10:35 PM  Result Value Ref Range   Glucose-Capillary 175 (H) 70 - 99 mg/dL    Comment: Glucose reference range applies only to samples taken after fasting for at least 8 hours.  CBC     Status: Abnormal   Collection Time: 10/25/21  4:31 AM  Result Value Ref Range   WBC 9.3 4.0 - 10.5 K/uL   RBC 3.14 (L) 4.22 - 5.81 MIL/uL   Hemoglobin 8.6 (L) 13.0 - 17.0 g/dL   HCT 29.8 (L) 39.0 - 52.0 %   MCV 94.9 80.0 - 100.0 fL   MCH 27.4 26.0 - 34.0 pg   MCHC 28.9 (L) 30.0 - 36.0 g/dL   RDW 17.1 (H) 11.5 - 15.5 %   Platelets 203 150 - 400 K/uL   nRBC 0.0 0.0 - 0.2 %    Comment: Performed at [facility], [address], [place], [name] [address]  Basic metabolic panel     Status: Abnormal   Collection Time: 10/25/21  4:31 AM  Result Value Ref Range   Sodium 140 135 - 145 mmol/L   Potassium 4.1 3.5 - 5.1 mmol/L   Chloride 90 (L) 98 - 111 mmol/L   CO2 38 (H) 22 - 32 mmol/L   Glucose, Bld 166 (H) 70 - 99 mg/dL    Comment: Glucose reference range applies only to samples taken after fasting for at least 8 hours.   BUN 50 (H) 8 - 23 mg/dL   Creatinine, Ser 1.34 (H) 0.61 - 1.24 mg/dL   Calcium 8.4  (L) 8.9 - 10.3 mg/dL   GFR, Estimated 58 (L) >60 mL/min    Comment: (NOTE) Calculated using the CKD-EPI Creatinine Equation (2021)    Anion gap 12 5 - 15    Comment: Performed at [facility], [address], [place], [place] [address]  Magnesium     Status: Abnormal   Collection Time: 10/25/21  4:31 AM  Result Value Ref Range   Magnesium 2.5 (H) 1.7 - 2.4 mg/dL    Comment: Performed at [facility], [address], [place], [place] [address]  Phosphorus     Status: None   Collection Time: 10/25/21  4:31 AM  Result Value Ref Range   Phosphorus 4.1 2.5 - 4.6 mg/dL    Comment: Performed at [facility], [address] [place], [place] [address]  Glucose, capillary     Status: Abnormal   Collection Time: 10/25/21  8:15 AM  Result Value Ref Range   Glucose-Capillary 163 (H) 70 - 99 mg/dL    Comment: Glucose reference range applies only to samples taken after fasting for at least 8 hours.  Glucose, capillary     Status: Abnormal   Collection Time: 10/25/21 11:50 AM  Result Value Ref Range   Glucose-Capillary 206 (H) 70 - 99 mg/dL    Comment: Glucose reference range applies only to samples taken after fasting for at least 8 hours.    Current Facility-Administered Medications  Medication Dose Route Frequency Provider Last Rate Last Admin   (feeding supplement) PROSource Plus liquid 30 mL  30 mL Oral TID BM Wieting, Adreyan, MD   30 mL at 10/24/21 0900   0.9 %  sodium chloride infusion  250 mL Intravenous Continuous Teressa Lower, NP 10 mL/hr at 10/25/21 1400  Infusion Verify at 10/25/21 1400   acetaminophen (TYLENOL) tablet 650 mg  650 mg Oral Q6H PRN Katha Cabal, MD   650 mg at 10/24/21 1651   Or   acetaminophen (TYLENOL) suppository 650 mg  650 mg Rectal Q6H PRN Schnier, Dolores Lory, MD       albuterol (PROVENTIL) (2.5 MG/3ML) 0.083% nebulizer solution 2.5 mg  2.5 mg Nebulization Q4H PRN Wyvonnia Dusky, MD       amiodarone (PACERONE) tablet 200  mg  200 mg Oral Daily Loletha Grayer, MD   200 mg at 10/25/21 1106   apixaban (ELIQUIS) tablet 5 mg  5 mg Oral BID Kris Hartmann, NP   5 mg at 10/25/21 1113   ascorbic acid (VITAMIN C) tablet 500 mg  500 mg Oral Daily Schnier, Dolores Lory, MD   500 mg at 10/25/21 1107   aspirin EC tablet 81 mg  81 mg Oral Daily Katha Cabal, MD   81 mg at 10/25/21 1106   budesonide (PULMICORT) nebulizer solution 0.5 mg  0.5 mg Nebulization BID Teressa Lower, NP   0.5 mg at 10/25/21 0803   ceFEPIme (MAXIPIME) 2 g in sodium chloride 0.9 % 100 mL IVPB  2 g Intravenous Q8H Benita Gutter, RPH   Paused at 10/25/21 1156   chlorhexidine (PERIDEX) 0.12 % solution 15 mL  15 mL Mouth Rinse BID Schnier, Dolores Lory, MD   15 mL at 10/24/21 2306   Chlorhexidine Gluconate Cloth 2 % PADS 6 each  6 each Topical Daily Loletha Grayer, MD   6 each at 10/24/21 0900   cholecalciferol (VITAMIN D3) tablet 1,000 Units  1,000 Units Oral Daily Schnier, Dolores Lory, MD   1,000 Units at 10/25/21 1106   ezetimibe (ZETIA) tablet 10 mg  10 mg Oral Daily Schnier, Dolores Lory, MD   10 mg at 10/25/21 1105   ferrous sulfate tablet 325 mg  325 mg Oral Q breakfast Schnier, Dolores Lory, MD   325 mg at 10/25/21 1107   finasteride (PROSCAR) tablet 5 mg  5 mg Oral Daily Loletha Grayer, MD   5 mg at 10/25/21 1105   haloperidol lactate (HALDOL) injection 1 mg  1 mg Intravenous Q6H PRN Loletha Grayer, MD   1 mg at 10/24/21 0348   insulin aspart (novoLOG) injection 0-20 Units  0-20 Units Subcutaneous TID WC Schnier, Dolores Lory, MD   7 Units at 10/25/21 1308   insulin aspart (novoLOG) injection 8 Units  8 Units Subcutaneous TID WC Wyvonnia Dusky, MD   8 Units at 10/25/21 1307   insulin glargine-yfgn (SEMGLEE) injection 21 Units  21 Units Subcutaneous QHS Loletha Grayer, MD   21 Units at 10/24/21 2303   ipratropium-albuterol (DUONEB) 0.5-2.5 (3) MG/3ML nebulizer solution 3 mL  3 mL Nebulization Q6H Wyvonnia Dusky, MD   3 mL at 10/25/21 1416    linezolid (ZYVOX) IVPB 600 mg  600 mg Intravenous Q12H Loletha Grayer, MD   Stopped at 10/25/21 1311   methylPREDNISolone sodium succinate (SOLU-MEDROL) 40 mg/mL injection 40 mg  40 mg Intravenous Daily Teressa Lower, NP   40 mg at 10/25/21 1119   metoprolol tartrate (LOPRESSOR) injection 2.5-5 mg  2.5-5 mg Intravenous Q6H PRN Teressa Lower, NP       midodrine (PROAMATINE) tablet 10 mg  10 mg Oral TID WC Teressa Lower, NP   10 mg at 10/25/21 1105   multivitamin with minerals tablet 1 tablet  1 tablet Oral  Daily Schnier, Dolores Lory, MD   1 tablet at 10/25/21 1107   nitroGLYCERIN (NITROSTAT) SL tablet 0.4 mg  0.4 mg Sublingual Q5 min PRN Schnier, Dolores Lory, MD       norepinephrine (LEVOPHED) '4mg'$  in 257m (0.016 mg/mL) premix infusion  2-10 mcg/min Intravenous Titrated NTeressa Lower NP 11.25 mL/hr at 10/25/21 1400 3 mcg/min at 10/25/21 1400   nystatin (MYCOSTATIN/NYSTOP) topical powder   Topical BID Schnier, GDolores Lory MD   Given at 10/25/21 1108   ondansetron (ZOFRAN) tablet 4 mg  4 mg Oral Q6H PRN Schnier, GDolores Lory MD       Or   ondansetron ([facility] injection 4 mg  4 mg Intravenous Q6H PRN Schnier, GDolores Lory MD       Oral care mouth rinse  15 mL Mouth Rinse 4 times per day GNicole KindredA, DO   15 mL at 10/24/21 2333   Oral care mouth rinse  15 mL Mouth Rinse PRN GNicole KindredA, DO       primidone (MYSOLINE) tablet 250 mg  250 mg Oral BID Schnier, GDolores Lory MD   250 mg at 10/24/21 2302   QUEtiapine (SEROQUEL) tablet 12.5 mg  12.5 mg Oral QHS PRN WLoletha Grayer MD       risperiDONE (RISPERDAL) tablet 0.5 mg  0.5 mg Oral BID WLoletha Grayer MD   0.5 mg at 10/24/21 1652   sterile water (preservative free) injection            tamsulosin (FLOMAX) capsule 0.4 mg  0.4 mg Oral BID WLoletha Grayer MD   0.4 mg at 10/24/21 2304   vitamin B-12 (CYANOCOBALAMIN) tablet 1,000 mcg  1,000 mcg Oral Daily Schnier, GDolores Lory MD   1,000 mcg at 10/24/21 03748   Musculoskeletal: Strength &  Muscle Tone: decreased Gait & Station: unable to stand Patient leans: [name]            Psychiatric Specialty Exam:  Presentation  General Appearance: No data recorded Eye Contact:No data recorded Speech:No data recorded Speech Volume:No data recorded Handedness:No data recorded  Mood and Affect  Mood:No data recorded Affect:No data recorded  Thought Process  Thought Processes:No data recorded Descriptions of Associations:No data recorded Orientation:No data recorded Thought Content:No data recorded History of Schizophrenia/Schizoaffective disorder:No data recorded Duration of Psychotic Symptoms:No data recorded Hallucinations:No data recorded Ideas of Reference:No data recorded Suicidal Thoughts:No data recorded Homicidal Thoughts:No data recorded  Sensorium  Memory:No data recorded Judgment:No data recorded Insight:No data recorded  Executive Functions  Concentration:No data recorded Attention Span:No data recorded Recall:No data recorded Fund of Knowledge:No data recorded Language:No data recorded  Psychomotor Activity  Psychomotor Activity:No data recorded  Assets  Assets:No data recorded  Sleep  Sleep:No data recorded  Physical Exam: Physical Exam Vitals and nursing note reviewed.  Constitutional:      Appearance: Normal appearance. He is ill-appearing.  HENT:     Head: Normocephalic and atraumatic.     Mouth/Throat:     Pharynx: Oropharynx is clear.  Eyes:     Pupils: Pupils are equal, round, and reactive to light.  Cardiovascular:     Rate and Rhythm: Normal rate and regular rhythm.  Pulmonary:     Effort: Pulmonary effort is normal.     Breath sounds: Normal breath sounds.  Abdominal:     General: Abdomen is flat.     Palpations: Abdomen is soft.  Musculoskeletal:        General: Normal range of motion.  Skin:  General: Skin is warm and dry.  Neurological:     General: No focal deficit present.     Mental Status: He is alert.  Mental status is at baseline.  Psychiatric:        Attention and Perception: He is inattentive.        Mood and Affect: Mood normal. Affect is blunt.        Speech: Speech is delayed.        Behavior: Behavior is slowed.        Thought Content: Thought content normal. Thought content is not paranoid.        Cognition and Memory: Cognition is impaired. Memory is impaired.    Review of Systems  Constitutional: Negative.   HENT: Negative.    Eyes: Negative.   Respiratory: Negative.    Cardiovascular: Negative.   Gastrointestinal: Negative.   Musculoskeletal: Negative.   Skin: Negative.   Neurological: Negative.   Psychiatric/Behavioral:  Positive for memory loss. Negative for depression, hallucinations, substance abuse and suicidal ideas. The patient is not nervous/anxious.    Blood pressure 97/64, pulse (!) 116, temperature 98.7 F (37.1 C), temperature source Axillary, resp. rate 19, height '6\' 4"'$  (1.93 m), weight (!) 157.8 kg, SpO2 95 %. Body mass index is 42.35 kg/m.  Treatment Plan Summary: Plan patient seems to clearly be much better today than yesterday by the consensus of everybody working with him.  Possibly just resolving some of the many medical issues possibly some assistance from adding antipsychotics.  Seems to have tolerated the addition of low-dose Risperdal.  I explained to his wife that medications for delirium were not intended to make him asleep all the time but just to improve the clarity of his thinking.  She was agreeable to the plan.  I would not change any of his medicines at this point.  Continue normal medical care.  We can follow as needed.  Disposition: Patient does not meet criteria for psychiatric inpatient admission. Supportive therapy provided about ongoing stressors. Discussed crisis plan, support from social network, calling 911, coming to the Emergency Department, and calling Suicide Hotline.  Alethia Berthold, MD 10/25/2021 3:11 PM

## 2021-10-25 NOTE — Procedures (Signed)
PROCEDURE SUMMARY:  Successful US guided right thoracentesis. Yielded 1.2 L of clear yellow fluid. Pt tolerated procedure well. No immediate complications.  Specimen was sent for labs. CXR ordered.  EBL < 1 mL  Tsosie Billing D PA-C 10/25/2021 2:18 PM

## 2021-10-25 NOTE — Progress Notes (Signed)
[place] for Electrolyte Monitoring and Replacement   Recent Labs: Potassium (mmol/L)  Date Value  10/25/2021 4.1  07/12/2011 4.4   Magnesium (mg/dL)  Date Value  10/25/2021 2.5 (H)   Calcium (mg/dL)  Date Value  10/25/2021 8.4 (L)   Calcium, Total (mg/dL)  Date Value  07/12/2011 8.8   Albumin (g/dL)  Date Value  09/28/2021 2.9 (L)   Phosphorus (mg/dL)  Date Value  10/25/2021 4.1   Sodium (mmol/L)  Date Value  10/25/2021 140  07/12/2011 141   Corrected Ca: 9.3 mg/dL  Assessment: 26 YOM admitted with wound infection. Found to have osteomyelitis of L heel. PMH significant for uncontrolled T0ZS, chronic diastolic dysfunction CHF, HTN, CAD, COPD, afib, morbid obesity, OSA, s/p recent wound debridement and partial calcanectomy (05/16). Pharmacy is asked to follow and replace electrolytes while in the CCU  Goal of Therapy:  Potassium 4.0 - 5.1 mmol/L Magnesium 2.0 - 2.4 mg/dL All Other Electrolytes WNL  Plan:  No electrolyte replacement warranted for today Recheck electrolytes in am 6/29  Dallie Piles ,PharmD Clinical Pharmacist 10/25/2021 1:13 PM

## 2021-10-25 NOTE — Progress Notes (Deleted)
omit 

## 2021-10-25 NOTE — Progress Notes (Signed)
NAME:  Alec Voong., MRN:  [id number], DOB:  1956/03/19, LOS: 30 ADMISSION DATE:  09/28/2021, CONSULTATION DATE: 10/24/2021 REFERRING MD: Dr. Delana Meyer, CHIEF COMPLAINT: Hypoxia   History of Present Illness:  This is a 66 yo male who presented to [facility] ER on 06/1 via EMS from home with c/o left lower extremity erythema, warmth, and discomfort to the left lower leg.  The pt also had concerns regarding fluid overload due to increased swelling.  He reported he went to the heart failure clinic 2 days prior to ER presentation and was given iv lasix.  He previously has had a partial calcanectomy and has been followed by podiatry and the wound care clinic with wound vac in place.    ED Course  Upon arrival to the ER MR Left Foot revealed substantially increased osteomyelitis of the calcaneus; partial rupture of the plantar fascia; a large plantar heel ulcer extending to the bony margin of the calcaneus; and diffuse cellulitis along the left heel.  On call podiatrist Dr. Cleda Mccreedy consulted by ER provider.  Pt received ceftriaxone and flagyl.  CXR obtained and revealed pulmonary edema, therefore pt received iv lasix and placed on oxygen.  He was subsequently admitted to the telemetry unit for additional workup and treatment.  See detailed hospital course listed under significant events.    Pertinent  Medical History  Atrial Fibrillation  Asthma  COPD CAD Depression  Type II Diabetes Mellitus Gout  Anabolic Steroid Use HLD HTN Hypogonadism in Male Morbid Obesity MI PVD Perirectal Abscess  Pluerisy  OSA~CPAP qhs Varicella Chronic Diastolic CHF   Significant Hospital Events: Including procedures, antibiotic start and stop dates in addition to other pertinent events   06/1: Pt admitted to the [facility] unit with acute on chronic diastolic CHF and left heel osteomyelitis  06/2: Podiatry consulted for left foot osteomyelitis and felt left foot not salvageable and will require higher amputation.   Vascular surgery consulted  06/4: ID consulted for MRSA bacteremia likely source left heel wound  06/6: Pt underwent left BKA per vascular surgery  06/6 to 06/17: Pt with worsening acute on chronic hypoxic respiratory failure secondary to volume overload due to diastolic CHF.  CXR revealed new pleural effusion.  IV lasix resumed.  Requiring Bipap or Sanders  06/21 to 06/27: mentation waxed and waned requiring Bipap during the day due to hypoxia  06/27: Pt requiring 100% HHFNC and transfer to the stepdown unit.  PCCM team consulted to assist with management  6/28: Pt tolerating HHFNC @ 50L/85%  Cultures:   6/1 BCx: 1/4 bottles MRSA 6/4 L Foot Wound: MRSA 6/5 BCx: NG 6/24 UCx: 60k CFU / mL Citrobacter freundii   Antimicrobials:   Zosyn 6/1 x 1 Vancomycin 6/1 x 1, 6/3 >> 6/6 Metronidazole 6/2 >> 6/6 Ceftriaxone 6/2 >> 6/5, 6/25 >> 6/27 Daptomycin 6/7 >>6/27  Bactrim 6/27 x 1 Cefepime 6/27 >> Linezolid 6/27>>  Interim History / Subjective:  Pt states he feels better today.  No other complaints.  No acute events overnight   Objective   Blood pressure (!) 83/60, pulse (!) 117, temperature 97.6 F (36.4 C), temperature source Axillary, resp. rate 17, height '6\' 4"'$  (1.93 m), weight (!) 157.8 kg, SpO2 (!) 89 %.    FiO2 (%):  [80 %] 80 %   Intake/Output Summary (Last 24 hours) at 10/25/2021 0804 Last data filed at 10/25/2021 0500 Gross per 24 hour  Intake 1113 ml  Output 1050 ml  Net 63 ml  Filed Weights   10/21/21 0500 10/23/21 0500 10/25/21 0500  Weight: (!) 159.8 kg (!) 157.2 kg (!) 157.8 kg    Examination: General: Acute on chronically ill appearing male, NAD on HHFNC HENT: Supple, no JVD  Lungs: Diminished throughout, even, non labored  Cardiovascular: Sinus tach, rrr, no r/g, 2+ radial/1+ distal pulses Abdomen: +BS x4, obese, soft, non tender  Extremities: Left BKA, chronic RLE vascular changes  Neuro: Alert disoriented to situation/time, follows commands, PERRLA GU:  Indwelling foley catheter draining yellow urine   Resolved Hospital Problem list     Assessment & Plan:  Acute on chronic hypoxic respiratory failure secondary to acute diastolic CHF exacerbation, pneumonia, and atelectasis vs. right-sided pleural effusion   OSA Hx: Asthma and Obesity  - Supplemental O2 for dyspnea and/or hypoxia  - Bipap qhs  - Right-sided thoracentesis pending  - Continue abx as outlined above  - Scheduled and prn bronchodilator therapy  - Nebulized steroids  - HIGH RISK FOR INTUBATION   Acute on chronic diastolic CHF Paroxysmal atrial fibrillation  Hx: HLD, CAD, HTN, HLD, and MI Echo 10/05/21 revealed EF 55 to 60% - Continuous telemetry monitoring  - Discontinue iv lasix due to severe alkalosis and worsening encephalopathy if diuresis needed will give diamox  - Cardiology consulted appreciate input: continue amiodarone, apixaban, aspirin, ezetimibe, and finasteride  Left lower extremity osteomyelitis s/p left BKA 06/6 Pneumonia  MRSA bacteremia secondary osteomyelitis  Citrobacter freundii UTI  - Trend WBC and monitor fever curve  - Trend PCT and lactic acid  - Abx as outlined above   Urinary retention - Strict I&O's - Continue flomax   Type II Diabetes Mellitus  - CBG's ac/hs - SSI, scheduled novolog 4 units 3 times a day with meals, and semglee   Acute encephalopathy multifactorial secondary to infectious process and metabolic derangements - Frequent reorientation  - Continue seroquel, risperdal, and prn haldol  Best Practice (right click and "Reselect all SmartList Selections" daily)   Diet/type: Regular consistency (see orders) DVT prophylaxis: apixaban  GI prophylaxis: [name] Lines: [name] Foley:  Yes, and it is still needed Code Status:  full code Last date of multidisciplinary goals of care discussion [10/25/2021]  Labs   CBC: Recent Labs  Lab 10/20/21 0719 10/23/21 0554 10/24/21 0638 10/25/21 0431  WBC  --  11.8* 10.3 9.3  HGB 10.2*  9.0* 9.0* 8.6*  HCT  --  31.4* 30.6* 29.8*  MCV  --  96.0 94.2 94.9  PLT  --  228 192 147    Basic Metabolic Panel: Recent Labs  Lab 10/21/21 0602 10/22/21 0556 10/23/21 0554 10/24/21 0638 10/25/21 0431  NA 139 140 141 143 140  K 4.6 4.5 4.1 4.3 4.1  CL 90* 91* 90* 93* 90*  CO2 42* 42* 41* 40* 38*  GLUCOSE 77 120* 140* 136* 166*  BUN 32* 34* 39* 40* 50*  CREATININE 0.85 1.02 1.06 1.06 1.34*  CALCIUM 8.5* 8.7* 8.5* 8.4* 8.4*   GFR: Estimated Creatinine Clearance: 88.4 mL/min (A) (by C-G formula based on SCr of 1.34 mg/dL (H)). Recent Labs  Lab 10/23/21 0554 10/24/21 0638 10/24/21 1806 10/25/21 0431  PROCALCITON  --   --  0.27  --   WBC 11.8* 10.3  --  9.3  LATICACIDVEN  --   --  0.9  --     Liver Function Tests: No results for input(s): "AST", "ALT", "ALKPHOS", "BILITOT", "PROT", "ALBUMIN" in the last 168 hours. No results for input(s): "LIPASE", "AMYLASE" in the  last 168 hours. No results for input(s): "AMMONIA" in the last 168 hours.  ABG    Component Value Date/Time   PHART 7.45 10/24/2021 1804   PCO2ART 74 (HH) 10/24/2021 1804   PO2ART 60 (L) 10/24/2021 1804   HCO3 51.4 (H) 10/24/2021 1804   O2SAT 91.5 10/24/2021 1804     Coagulation Profile: No results for input(s): "INR", "PROTIME" in the last 168 hours.  Cardiac Enzymes: Recent Labs  Lab 10/23/21 0554  CKTOTAL 19*    HbA1C: Hgb A1c MFr Bld  Date/Time Value Ref Range Status  08/05/2021 04:27 AM 10.2 (H) 4.8 - 5.6 % Final    Comment:    (NOTE) Pre diabetes:          5.7%-6.4%  Diabetes:              >6.4%  Glycemic control for   <7.0% adults with diabetes   05/09/2021 07:20 AM 9.2 (H) 4.8 - 5.6 % Final    Comment:    (NOTE) Pre diabetes:          5.7%-6.4%  Diabetes:              >6.4%  Glycemic control for   <7.0% adults with diabetes     CBG: Recent Labs  Lab 10/24/21 0711 10/24/21 1112 10/24/21 1555 10/24/21 1839 10/24/21 2235  GLUCAP 140* 256* 318* 231* 175*     Review of Systems: Positives in BOLD   Gen: Denies fever, chills, weight change, fatigue, night sweats HEENT: Denies blurred vision, double vision, hearing loss, tinnitus, sinus congestion, rhinorrhea, sore throat, neck stiffness, dysphagia PULM: Denies shortness of breath, cough, sputum production, hemoptysis, wheezing CV: Denies chest pain, edema, orthopnea, paroxysmal nocturnal dyspnea, palpitations GI: Denies abdominal pain, nausea, vomiting, diarrhea, hematochezia, melena, constipation, change in bowel habits GU: Denies dysuria, hematuria, polyuria, oliguria, urethral discharge Endocrine: Denies hot or cold intolerance, polyuria, polyphagia or appetite change Derm: Denies rash, dry skin, scaling or peeling skin change Heme: Denies easy bruising, bleeding, bleeding gums Neuro: Denies headache, numbness, weakness, slurred speech, loss of memory or consciousness  Past Medical History:  He,  has a past medical history of Arrhythmia, Asthma, CHF (congestive heart failure) ([place]), COPD (chronic obstructive pulmonary disease) ([place]), Coronary artery disease, Depression, Diabetes mellitus without complication ([place]), Gout, History anabolic steroid use, Hyperlipidemia, Hypertension, Hypogonadism in male, Morbid obesity ([place]), Myocardial infarction [facility]), Peripheral vascular disease ([place]), Perirectal abscess, Pleurisy, Sleep apnea, and Varicella.   Surgical History:   Past Surgical History:  Procedure Laterality Date   ABDOMINAL AORTIC ANEURYSM REPAIR     ACHILLES TENDON SURGERY Left 01/10/2021   Procedure: ACHILLES LENGTHENING/KIDNER;  Surgeon: Caroline More, DPM;  Location: [facility] ORS;  Service: Podiatry;  Laterality: Left;   AMPUTATION Left 10/03/2021   Procedure: AMPUTATION BELOW KNEE;  Surgeon: Katha Cabal, MD;  Location: [facility] ORS;  Service: Vascular;  Laterality: Left;   AMPUTATION TOE Right 02/10/2016   Procedure: AMPUTATION TOE 3RD TOE;  Surgeon: Samara Deist, DPM;  Location: [facility]  ORS;  Service: Podiatry;  Laterality: Right;   AMPUTATION TOE Left 02/24/2020   Procedure: AMPUTATION TOE;  Surgeon: Caroline More, DPM;  Location: [facility] ORS;  Service: Podiatry;  Laterality: Left;   APPLICATION OF WOUND VAC Left 02/29/2020   Procedure: APPLICATION OF WOUND VAC;  Surgeon: Caroline More, DPM;  Location: [facility] ORS;  Service: Podiatry;  Laterality: Left;   COLONOSCOPY WITH PROPOFOL [name] 11/18/2015   Procedure: COLONOSCOPY WITH PROPOFOL;  Surgeon: Manya Silvas, MD;  Location: [facility] ENDOSCOPY;  Service: Endoscopy;  Laterality: [name];   CORONARY ARTERY BYPASS GRAFT     CORONARY STENT INTERVENTION [name] 02/02/2020   Procedure: CORONARY STENT INTERVENTION;  Surgeon: Isaias Cowman, MD;  Location: [place] CV LAB;  Service: Cardiovascular;  Laterality: [name];   INCISION AND DRAINAGE Left 08/07/2021   Procedure: INCISION AND DRAINAGE-Partial Calcanectomy;  Surgeon: Caroline More, DPM;  Location: [facility] ORS;  Service: Podiatry;  Laterality: Left;   IRRIGATION AND DEBRIDEMENT FOOT Left 02/29/2020   Procedure: IRRIGATION AND DEBRIDEMENT FOOT;  Surgeon: Caroline More, DPM;  Location: [facility] OR[facility];  Service: Podiatry;  Laterality: Left;   IRRIGATION AND DEBRIDEMENT FOOT Left 02/24/2020   Procedure: IRRIGATION AND DEBRIDEMENT FOOT;  Surgeon: Caroline More, DPM;  Location: [facility] OR[facility];  Service: Podiatry;  Laterality: Left;   KNEE ARTHROSCOPY     LEFT HEART CATH AND CORS/GRAFTS ANGIOGRAPHY [name] 02/02/2020   Procedure: LEFT HEART CATH AND CORS/GRAFTS ANGIOGRAPHY;  Surgeon: Teodoro Spray, MD;  Location: [place] [facility];  Service: Cardiovascular;  Laterality: [name];   LOWER EXTREMITY ANGIOGRAPHY Left 02/25/2020   Procedure: Lower Extremity Angiography;  Surgeon: Algernon Huxley, MD;  Location: [place] [facility] LAB;  Service: Cardiovascular;  Laterality: Left;   LOWER EXTREMITY ANGIOGRAPHY Left 01/04/2021   Procedure: LOWER EXTREMITY ANGIOGRAPHY;  Surgeon: Algernon Huxley, MD;  Location: [place] [facility] LAB;   Service: Cardiovascular;  Laterality: Left;   METATARSAL HEAD EXCISION Left 01/10/2021   Procedure: METATARSAL HEAD EXCISION - LEFT 5th;  Surgeon: Caroline More, DPM;  Location: [facility] ORS;  Service: Podiatry;  Laterality: Left;   PERIPHERAL VASCULAR CATHETERIZATION Right 01/24/2016   Procedure: Lower Extremity Angiography;  Surgeon: Katha Cabal, MD;  Location: [place] [facility];  Service: Cardiovascular;  Laterality: Right;   PERIPHERAL VASCULAR CATHETERIZATION Right 01/25/2016   Procedure: Lower Extremity Angiography;  Surgeon: Katha Cabal, MD;  Location: [place] [facility];  Service: Cardiovascular;  Laterality: Right;   TOE AMPUTATION     TONSILLECTOMY       Social History:   reports that he quit smoking about 6 years ago. His smoking use included cigarettes. He has a 22.50 pack-year smoking history. He has never used smokeless tobacco. He reports that he does not currently use alcohol after a past usage of about 3.0 standard drinks of alcohol per week. He reports that he does not use drugs.   Family History:  His family history includes Alcohol abuse in his father; Coronary artery disease in his father; Heart failure in his brother; Hypertension in his father.   Allergies Allergies  Allergen Reactions   Penicillins Other (See Comments)    Happened at 66 years old and pt. stated he passed out  He has tolerated amoxicillin/clavulanate and ampicillin/sulbactam   Statins     Other reaction(s): Muscle Pain Causes legs to ache per pt     Home Medications  Prior to Admission medications   Medication Sig Start Date End Date Taking? Authorizing Provider  amiodarone (PACERONE) 200 MG tablet Take 200 mg by mouth daily.   Yes [provider]  amoxicillin-clavulanate (AUGMENTIN) 875-125 MG tablet Take 1 tablet by mouth 2 (two) times daily. 09/18/21  Yes Tsosie Billing, MD  apixaban (ELIQUIS) 5 MG TABS tablet Take 1 tablet (5 mg total) by mouth 2 (two) times daily.  01/26/16  Yes Stegmayer, Janalyn Harder, PA-C  aspirin EC 81 MG EC tablet Take 1 tablet (81 mg total) by mouth daily. Swallow whole. 05/17/21  Yes  Dessa Phi, DO  Cholecalciferol 25 MCG (1000 UT) tablet Take 1,000 Units by mouth daily.   Yes [provider]  Cyanocobalamin (VITAMIN B-12) 5000 MCG TBDP Take 10,000 mcg by mouth daily.   Yes [provider]  diltiazem (CARDIZEM CD) 300 MG 24 hr capsule Take 1 capsule (300 mg total) by mouth daily. 01/16/21  Yes Bonnielee Haff, MD  ezetimibe (ZETIA) 10 MG tablet Take 10 mg by mouth at bedtime.   Yes [provider]  ferrous sulfate 325 (65 FE) MG tablet Take 325 mg by mouth daily with breakfast.   Yes [provider]  gabapentin (NEURONTIN) 300 MG capsule Take 300 mg by mouth at bedtime. 12/02/20  Yes [provider]  HUMALOG 100 UNIT/ML injection Inject 15 Units into the skin in the morning and at bedtime. Sliding scale 08/18/21  Yes [provider]  insulin glargine (LANTUS) 100 UNIT/ML Solostar Pen Inject 25 Units into the skin in the morning and at bedtime. 08/11/21  Yes Fritzi Mandes, MD  isosorbide mononitrate (IMDUR) 60 MG 24 hr tablet Take 60 mg by mouth 2 (two) times daily. 04/21/21  Yes [provider]  losartan (COZAAR) 50 MG tablet Take 50 mg by mouth daily.   Yes [provider]  metFORMIN (GLUCOPHAGE) 1000 MG tablet Take 1,000 mg by mouth 2 (two) times daily.  12/02/17  Yes [provider]  metoprolol tartrate (LOPRESSOR) 50 MG tablet Take 100 mg by mouth 2 (two) times daily. 04/21/21  Yes [provider]  Multiple Vitamins-Minerals (MULTIVITAMIN WITH MINERALS) tablet Take 1 tablet by mouth daily.   Yes [provider]  nitroGLYCERIN (NITROSTAT) 0.4 MG SL tablet Place 0.4 mg under the tongue every 5 (five) minutes as needed for chest pain.   Yes [provider]  pramipexole (MIRAPEX) 1 MG tablet Take 2 mg by mouth at bedtime. 11/09/20  Yes  [provider]  primidone (MYSOLINE) 250 MG tablet Take 250 mg by mouth 2 (two) times daily. 01/23/20  Yes [provider]  tamsulosin (FLOMAX) 0.4 MG CAPS capsule Take 0.4 mg by mouth daily.   Yes [provider]  torsemide (DEMADEX) 20 MG tablet Take 3 tablets (60 mg total) by mouth daily. 09/26/21  Yes Hackney, Otila Kluver A, FNP  traMADol (ULTRAM) 50 MG tablet Take 1 tablet (50 mg total) by mouth daily as needed for moderate pain. 08/11/21  Yes Fritzi Mandes, MD  vitamin C (ASCORBIC ACID) 500 MG tablet Take 500 mg by mouth daily.   Yes [provider]  zinc gluconate 50 MG tablet Take 50 mg by mouth daily.   Yes [provider]  Continuous Blood Gluc Sensor (FREESTYLE LIBRE 2 SENSOR) MISC  09/08/21   [provider]     Critical care time: 30 minutes      Donell Beers, [place] Pager [phone] (please enter 7 digits) PCCM Consult Pager [phone] (please enter 7 digits)

## 2021-10-25 NOTE — Progress Notes (Signed)
OT Cancellation Note  Patient Details Name: [name]. MRN: [id number] DOB: 01-08-1956   Cancelled Treatment:    Reason Eval/Treat Not Completed: Patient not medically ready;Other (comment) (Per Critical Care NP, hold therapy for today. OT will reattempt as able and appropriate.)  Shanon Payor, OTD OTR/L  10/25/21, 8:41 AM

## 2021-10-25 NOTE — Progress Notes (Signed)
Nutrition Follow-up  DOCUMENTATION CODES:   Morbid obesity, Non-severe (moderate) malnutrition in context of acute illness/injury  INTERVENTION:   -Continue Ensure Max po daily, each supplement provides 150 kcal and 30 grams of protein -Continue MVI with minerals daily -Continue 500 mg vitamin C BID -Continue double protein portions with meals -Continue feeding assistance with meals -Continue HS snack daily -Continue 30 ml Prosource Plus TID, each supplement provides 100 kcals and 15 grams protein  NUTRITION DIAGNOSIS:   Moderate Malnutrition related to acute illness (L heel osteomyelitis) as evidenced by energy intake < 75% for > 7 days, mild fat depletion.  Ongoing  GOAL:   Patient will meet greater than or equal to 90% of their needs  Progressing   MONITOR:   PO intake, Supplement acceptance, Labs, Weight trends, I & O's  REASON FOR ASSESSMENT:   Consult Wound healing  ASSESSMENT:   Pt admitted with wound infection. Found to have osteomyelitis of L heel. PMH significant for uncontrolled Y6AY, chronic diastolic dysfunction CHF, HTN, CAD, COPD, afib, morbid obesity, OSA, s/p recent wound debridement and partial calcanectomy (05/16).  6/6- s/p lt BKA  Reviewed I/O's: +63 ml x 24 hours and -23.5 L since 10/11/21  UOP: 1.1 L x 24 hours   Case discussed with MD, RN, and during ICU rounds. Pt with respiratory failure and pneumonia, currently on steroids and bronchodilators. Per MD, plan to hold diuresis due to concerns of over diuresis. Plan for ultrasound to confirm needs for thoracentesis.   Pt with variable oral intake. Noted meal completions 0-100% (usually 80-100%). Pt has been taking most of his supplements.   Plan for SNF placement once medically stable.   Medications reviewed and include vitamin C, vitamin D3, ferrous sulfate, vitamin B-12, levophed, and 0.9% sodium chloride infusion @ 10 ml/hr.   Labs reviewed: CBGS: 163-231 (inpatient orders for glycemic  control are 0-20 units insulin aspart TID with meals, 8 units insulin aspart TID with meals, and 21 units insulin glargine-yfgn daily at bedtime).    Diet Order:   Diet Order             Diet Carb Modified Fluid consistency: Thin; Room service appropriate? Yes; Fluid restriction: 1800 mL Fluid  Diet effective now                   EDUCATION NEEDS:   Not appropriate for education at this time  Skin:  Skin Assessment: Skin Integrity Issues: Skin Integrity Issues:: Other (Comment), Incisions Wound Vac: - Incisions: s/p lt BKA Other: venous statsis ulcer to rt pretibial, MASD to abdominal folds and groin  Last BM:  10/22/21  Height:   Ht Readings from Last 1 Encounters:  10/16/21 '6\' 4"'$  (1.93 m)    Weight:   Wt Readings from Last 1 Encounters:  10/25/21 (!) 157.8 kg    Ideal Body Weight:  91.8 kg  BMI:  Body mass index is 42.35 kg/m.  Estimated Nutritional Needs:   Kcal:  2100-2300  Protein:  120-140 grams  Fluid:  > 2 L    Loistine Chance, RD, LDN, [place] Registered Dietitian II Certified Diabetes Care and Education Specialist Please refer to [facility] for RD and/or RD on-call/weekend/after hours pager

## 2021-10-25 NOTE — Progress Notes (Signed)
[facility]  Patient ID: [name] MRN: [id number] DOB/AGE: [birth year] 67 y.o.   Admit date: 09/28/2021 Referring Physician Dr. Francine Graven Primary Physician Dr. Felipa Furnace Primary Cardiologist Dr. Donnelly Angelica Reason for Consultation acute on chronic HFpEF    HPI: Yohance "[name]" Ajene Carchi. is a [id number] with a PMH significant for CAD s/p CABG 2006 and PCI of RCA 01/2020, HFpEF (LVEF 50-55% 04/2021), A. fib / flutter on apixaban, amiodarone and digoxin, HTN, BPH, morbid obesity, poorly controlled type 2 diabetes,  PAD s/p left third toe amputation, chronic venous stasis, OSA on CPAP who presented to [facility] ED on 09/28/21 with increasing pain and swelling in his left leg, and imaging concerning for worsening osteomyelitis of his left foot. He has has onoging infections needing wound debridement in his left lower leg over the past several months; now s/p Left BKA 6/6. He has had a prolonged hospitalization and has been diuresed significantly with delirium, mental status waxing and waning. Cardiology is following for HFpEF and persistent atrial flutter. Worsening respiratory failure on 6/27 requiring transfer to stepdown.   Interval History:  - transferred to stepdown yesterday evening for closer monitory of respiratory status, currently requiring HHFNC at 50L/85% & hypotensive - cardizem and metop d/c, PCCM added levophed  - worsening RLL process, ? atlectesis vs pneumonia vs pleural effusion  - remains delirious, thinks he is at the grocery store.     Vitals:   10/25/21 0500 10/25/21 0503 10/25/21 0545 10/25/21 0803  BP: (!) 71/60  (!) 83/60   Pulse: (!) 117 (!) 117 (!) 117 (!) 116  Resp: (!) 24 (!) '25 17 20  '$ Temp: 97.6 F (36.4 C)     TempSrc: Axillary     SpO2: (!) 89% 90% (!) 89% 96%  Weight: (!) 157.8 kg     Height:         Intake/Output Summary (Last 24 hours) at 10/25/2021 0948 Last data filed at 10/25/2021 0500 Gross per 24 hour  Intake 1113 ml  Output 1050 ml  Net 63 ml     PHYSICAL EXAM General: Ill-appearing Caucasian male,  in no acute distress. Laying flat in bed in the ICU. HEENT:  Normocephalic and atraumatic. Neck:   No JVD.  Lungs: Normal respiratory effort on HHFNC.  Clear to auscultation anteriorly  Heart: Tachy but regular. Normal S1 and S2 without gallops or murmurs.  Abdomen: Obese appearing.  Msk: Normal strength and tone for age. Extremities:  RLE with venous stasis changes and woody appearance to skin,  Left leg BKA with dry padded dressing.  Both legs are much softer and less edematous than on presentation Neuro: Alert but delirious, thinks he is at a grocery store. Psych: answers simple questions   LABS: Basic Metabolic Panel: Recent Labs    10/24/21 0638 10/25/21 0431  NA 143 140  K 4.3 4.1  CL 93* 90*  CO2 40* 38*  GLUCOSE 136* 166*  BUN 40* 50*  CREATININE 1.06 1.34*  CALCIUM 8.4* 8.4*  MG  --  2.5*  PHOS  --  4.1    Liver Function Tests: No results for input(s): "AST", "ALT", "ALKPHOS", "BILITOT", "PROT", "ALBUMIN" in the last 72 hours.  No results for input(s): "LIPASE", "AMYLASE" in the last 72 hours. CBC: Recent Labs    10/24/21 0638 10/25/21 0431  WBC 10.3 9.3  HGB 9.0* 8.6*  HCT 30.6* 29.8*  MCV 94.2 94.9  PLT 192 203    Cardiac Enzymes: Recent Labs  10/23/21 0554  CKTOTAL 19*    BNP: Invalid input(s): "POCBNP" D-Dimer: No results for input(s): "DDIMER" in the last 72 hours. Hemoglobin A1C: No results for input(s): "HGBA1C" in the last 72 hours. Fasting Lipid Panel: No results for input(s): "CHOL", "HDL", "LDLCALC", "TRIG", "CHOLHDL", "LDLDIRECT" in the last 72 hours. Thyroid Function Tests: No results for input(s): "TSH", "T4TOTAL", "T3FREE", "THYROIDAB" in the last 72 hours.  Invalid input(s): "FREET3" Anemia Panel: No results for input(s): "VITAMINB12", "FOLATE", "FERRITIN", "TIBC", "IRON", "RETICCTPCT" in the last 72 hours.   DG Chest Port 1 View  Result Date:  10/24/2021 CLINICAL DATA:  Shortness of breath. MRSA sepsis. EXAM: PORTABLE CHEST 1 VIEW COMPARISON:  10/20/2021 FINDINGS: The heart is borderline enlarged but stable. Persistent right lower lobe process likely combination of effusion, atelectasis and infiltrate. Underlying vascular congestion, peribronchial thickening and possible mild pulmonary edema. IMPRESSION: Persistent right lower lobe process. Suspect mild pulmonary edema also. Electronically Signed   By: Marijo Sanes M.D.   On: 10/24/2021 10:46     Echo LVEF 55-60% 10/05/2021  TELEMETRY: Atrial flutter 119 bpm  ASSESSMENT AND PLAN:  Principal Problem:   Sepsis due to methicillin resistant Staphylococcus aureus (MRSA) (HCC) Active Problems:   Essential hypertension, benign   RLS (restless legs syndrome)   Osteomyelitis (HCC)   Obesity, Class III, BMI 40-49.9 (morbid obesity) (HCC)   Acute on chronic diastolic CHF (congestive heart failure) (HCC)   AF (paroxysmal atrial fibrillation) ([place])   Uncontrolled type 2 diabetes mellitus with hypoglycemia, with long-term current use of insulin (HCC)   Malnutrition of moderate degree   Generalized weakness   Acute respiratory failure with hypoxia and hypercapnia (HCC)   OSA (obstructive sleep apnea)   Acute delirium   Acute on chronic respiratory failure with hypoxia and hypercapnia (HCC)   Acute urinary retention   Hyperkalemia   Iron deficiency anemia   UTI (urinary tract infection)    1. Acute on chronic diastolic congestive heart failure, HFpEF, LVEF 55-60% 10/05/21, good diuresis after IV furosemide but remains on supplemental O2 2.  Paroxysmal atrial fibrillation/atrial flutter, currently in atrial flutter heart rate 117 bpm, on amiodarone and metoprolol for rate control and Eliquis for stroke prevention, cardizem '120mg'$  CD 3.  Coronary artery disease, status post CABG 2006, PCI RCA 01/2020, currently without chest pain 4.  Left foot osteomyelitis on IV linezolid, left BKA 6/6 with  Dr. Delana Meyer 5.  Acute kidney injury, improving to Cr 1.03 and >60  6.  MRSA bacteremia, osteomyelitis of L foot likely source, on IV daptomycin, repeat echo ordered by ID, resulted with thickened aortic and mitral leaflets without obvious vegetation. Will need 4 weeks of Abx, per ID 7. UTI, culture growing Citrobactr - on cefepime 8.  Respiratory failure with hypercarbia, worsening on 6/27 on HHFNC requiring transfer to stepdown    Recommendations   1.  Agree with current therapy, appreciate PCCM assistance 2.  Agree with diuretics per PCCM, s/p one dose of acetazolamide, R thoracentesis ordered by primary d/t persistent and worsening RLL consolidation - unclear if atelectasis vs pleural effusion 3.  Eliquis '5mg'$  BID restarted 6/9 for stroke risk reduction  4.  Continue amiodarone '200mg'$  once daily for rhythm control. hold metoprolol tartrate '100mg'$  BID and cardizem CD 120 mg d/t hypotension - started on Levophed 6/28 5.  S/p Left BKA 6/6  This patient's plan of care was discussed and created with Dr. Saralyn Pilar and he is in agreement.    Tristan Schroeder, [place] 10/25/2021 9:48  AM

## 2021-10-25 NOTE — Progress Notes (Signed)
PT Cancellation Note  Patient Details Name: [name]. MRN: [id number] DOB: 1955/12/17   Cancelled Treatment:    Reason Eval/Treat Not Completed: Medical issues which prohibited therapy. Patient currently requiring 50L at 85% Fi02. Critical care provider recommended to hold therapy intervention today. Also discussed plan with OT. PT will follow up tomorrow pending continued medical improvements, otherwise we might need to sign off until medically ready.   [name], PT, MPT  Percell Locus 10/25/2021, 8:40 AM

## 2021-10-26 ENCOUNTER — Ambulatory Visit: Payer: Medicare Other | Admitting: Family

## 2021-10-26 DIAGNOSIS — R531 Weakness: Secondary | ICD-10-CM | POA: Diagnosis not present

## 2021-10-26 DIAGNOSIS — I5033 Acute on chronic diastolic (congestive) heart failure: Secondary | ICD-10-CM | POA: Diagnosis not present

## 2021-10-26 DIAGNOSIS — N390 Urinary tract infection, site not specified: Secondary | ICD-10-CM

## 2021-10-26 DIAGNOSIS — A4102 Sepsis due to Methicillin resistant Staphylococcus aureus: Secondary | ICD-10-CM | POA: Diagnosis not present

## 2021-10-26 DIAGNOSIS — R7881 Bacteremia: Secondary | ICD-10-CM | POA: Diagnosis not present

## 2021-10-26 DIAGNOSIS — I509 Heart failure, unspecified: Secondary | ICD-10-CM | POA: Diagnosis not present

## 2021-10-26 DIAGNOSIS — G4733 Obstructive sleep apnea (adult) (pediatric): Secondary | ICD-10-CM | POA: Diagnosis not present

## 2021-10-26 DIAGNOSIS — G9341 Metabolic encephalopathy: Secondary | ICD-10-CM | POA: Diagnosis not present

## 2021-10-26 DIAGNOSIS — J9601 Acute respiratory failure with hypoxia: Secondary | ICD-10-CM | POA: Diagnosis not present

## 2021-10-26 DIAGNOSIS — R410 Disorientation, unspecified: Secondary | ICD-10-CM | POA: Diagnosis not present

## 2021-10-26 DIAGNOSIS — R6520 Severe sepsis without septic shock: Secondary | ICD-10-CM | POA: Diagnosis not present

## 2021-10-26 DIAGNOSIS — M869 Osteomyelitis, unspecified: Secondary | ICD-10-CM | POA: Diagnosis not present

## 2021-10-26 LAB — CBC
HCT: 30.4 % — ABNORMAL LOW (ref 39.0–52.0)
Hemoglobin: 8.6 g/dL — ABNORMAL LOW (ref 13.0–17.0)
MCH: 27 pg (ref 26.0–34.0)
MCHC: 28.3 g/dL — ABNORMAL LOW (ref 30.0–36.0)
MCV: 95.6 fL (ref 80.0–100.0)
Platelets: 212 10*3/uL (ref 150–400)
RBC: 3.18 MIL/uL — ABNORMAL LOW (ref 4.22–5.81)
RDW: 16.7 % — ABNORMAL HIGH (ref 11.5–15.5)
WBC: 7.7 10*3/uL (ref 4.0–10.5)
nRBC: 0 % (ref 0.0–0.2)

## 2021-10-26 LAB — CBC WITH DIFFERENTIAL/PLATELET
Abs Immature Granulocytes: 0.03 10*3/uL (ref 0.00–0.07)
Basophils Absolute: 0.1 10*3/uL (ref 0.0–0.1)
Basophils Relative: 1 %
Eosinophils Absolute: 1 10*3/uL — ABNORMAL HIGH (ref 0.0–0.5)
Eosinophils Relative: 13 %
HCT: 30.5 % — ABNORMAL LOW (ref 39.0–52.0)
Hemoglobin: 8.7 g/dL — ABNORMAL LOW (ref 13.0–17.0)
Immature Granulocytes: 0 %
Lymphocytes Relative: 12 %
Lymphs Abs: 0.9 10*3/uL (ref 0.7–4.0)
MCH: 27.4 pg (ref 26.0–34.0)
MCHC: 28.5 g/dL — ABNORMAL LOW (ref 30.0–36.0)
MCV: 95.9 fL (ref 80.0–100.0)
Monocytes Absolute: 0.5 10*3/uL (ref 0.1–1.0)
Monocytes Relative: 7 %
Neutro Abs: 5.2 10*3/uL (ref 1.7–7.7)
Neutrophils Relative %: 67 %
Platelets: 213 10*3/uL (ref 150–400)
RBC: 3.18 MIL/uL — ABNORMAL LOW (ref 4.22–5.81)
RDW: 16.8 % — ABNORMAL HIGH (ref 11.5–15.5)
WBC: 7.7 10*3/uL (ref 4.0–10.5)
nRBC: 0 % (ref 0.0–0.2)

## 2021-10-26 LAB — BASIC METABOLIC PANEL
Anion gap: 10 (ref 5–15)
BUN: 52 mg/dL — ABNORMAL HIGH (ref 8–23)
CO2: 39 mmol/L — ABNORMAL HIGH (ref 22–32)
Calcium: 8.5 mg/dL — ABNORMAL LOW (ref 8.9–10.3)
Chloride: 92 mmol/L — ABNORMAL LOW (ref 98–111)
Creatinine, Ser: 1.41 mg/dL — ABNORMAL HIGH (ref 0.61–1.24)
GFR, Estimated: 55 mL/min — ABNORMAL LOW (ref >60)
Glucose, Bld: 235 mg/dL — ABNORMAL HIGH (ref 70–99)
Potassium: 4.2 mmol/L (ref 3.5–5.1)
Sodium: 141 mmol/L (ref 135–145)

## 2021-10-26 LAB — PROTEIN, BODY FLUID (OTHER): Total Protein, Body Fluid Other: 2.5 g/dL

## 2021-10-26 LAB — GLUCOSE, CAPILLARY
Glucose-Capillary: 230 mg/dL — ABNORMAL HIGH (ref 70–99)
Glucose-Capillary: 273 mg/dL — ABNORMAL HIGH (ref 70–99)
Glucose-Capillary: 322 mg/dL — ABNORMAL HIGH (ref 70–99)
Glucose-Capillary: 291 mg/dL — ABNORMAL HIGH (ref 70–99)

## 2021-10-26 LAB — MAGNESIUM: Magnesium: 2.6 mg/dL — ABNORMAL HIGH (ref 1.7–2.4)

## 2021-10-26 LAB — BRAIN NATRIURETIC PEPTIDE: B Natriuretic Peptide: 276.6 pg/mL — ABNORMAL HIGH (ref 0.0–100.0)

## 2021-10-26 LAB — ALBUMIN: Albumin: 2.2 g/dL — ABNORMAL LOW (ref 3.5–5.0)

## 2021-10-26 LAB — PHOSPHORUS: Phosphorus: 4.1 mg/dL (ref 2.5–4.6)

## 2021-10-26 MED ORDER — LINEZOLID 600 MG PO TABS
600.0000 mg | ORAL_TABLET | Freq: Two times a day (BID) | ORAL | Status: AC
Start: 2021-10-26 — End: 2021-11-01
  Administered 2021-10-26 – 2021-11-01 (×13): 600 mg via ORAL
  Filled 2021-10-26 (×13): qty 1

## 2021-10-26 MED ORDER — METHYLPREDNISOLONE SODIUM SUCC 40 MG IJ SOLR
20.0000 mg | Freq: Every day | INTRAMUSCULAR | Status: DC
Start: 2021-10-27 — End: 2021-10-28
  Administered 2021-10-27: 20 mg via INTRAVENOUS
  Filled 2021-10-26: qty 1

## 2021-10-26 MED ORDER — ENSURE MAX PROTEIN PO LIQD
11.0000 [oz_av] | Freq: Two times a day (BID) | ORAL | Status: DC
  Administered 2021-10-26 – 2021-10-30 (×9): 11 [oz_av] via ORAL
  Administered 2021-10-31: 237 mL via ORAL
  Administered 2021-10-31 – 2021-11-13 (×24): 11 [oz_av] via ORAL
  Filled 2021-10-26 (×37): qty 330

## 2021-10-26 NOTE — Progress Notes (Signed)
[facility]  Patient ID: [name] MRN: [id number] DOB/AGE: [birth year] 66 y.o.   Admit date: 09/28/2021 Referring Physician Dr. Francine Graven Primary Physician Dr. Felipa Furnace Primary Cardiologist Dr. Donnelly Angelica Reason for Consultation acute on chronic HFpEF    HPI: Dawid "[name]" Develle Sievers. is a [id number] with a PMH significant for CAD s/p CABG 2006 and PCI of RCA 01/2020, HFpEF (LVEF 50-55% 04/2021), A. fib / flutter on apixaban, amiodarone and digoxin, HTN, BPH, morbid obesity, poorly controlled type 2 diabetes,  PAD s/p left third toe amputation, chronic venous stasis, OSA on CPAP who presented to [facility] ED on 09/28/21 with increasing pain and swelling in his left leg, and imaging concerning for worsening osteomyelitis of his left foot. He has has onoging infections needing wound debridement in his left lower leg over the past several months; now s/p Left BKA 6/6. He has had a prolonged hospitalization and has been diuresed significantly with delirium, mental status waxing and waning. Cardiology is following for HFpEF and persistent atrial flutter. Worsening respiratory failure on 6/27 requiring transfer to stepdown.   Interval History:  - continued stepdown care, remains on HHFNC slightly decreased now at 45L/70% and on midodrine & levophed - GOC discussions ongoing - s/p R thoracentesis with 1.2 L pleural fluid removed on 6/28 - alert and more conversational this afternoon, remembers me and able to talk at length about Dr. Ubaldo Glassing and this hospital stay - can't tell if his breathing is better after the thoracentesis, no complaints    Vitals:   10/26/21 1200 10/26/21 1300 10/26/21 1400 10/26/21 1414  BP: 108/61 108/64 101/61   Pulse: (!) 116 (!) 116 (!) 116 (!) 116  Resp: '16 15 14 15  '$ Temp:      TempSrc:      SpO2: 99% 99% 97%   Weight:      Height:         Intake/Output Summary (Last 24 hours) at 10/26/2021 1639 Last data filed at 10/26/2021 0736 Gross per 24 hour  Intake  735.08 ml  Output 900 ml  Net -164.92 ml    PHYSICAL EXAM General: Ill-appearing Caucasian male,  in no acute distress. Sitting upright in ICU bed HEENT:  Normocephalic and atraumatic. Neck:   No JVD.  Lungs: Normal respiratory effort on HHFNC.  Clear to auscultation anteriorly  Heart: Tachy but regular. Normal S1 and S2 without gallops or murmurs.  Abdomen: Obese appearing.  Msk: Normal strength and tone for age. Extremities:  RLE with venous stasis changes and woody appearance to skin,  Left leg BKA with dry padded dressing.  Both legs are much softer and less edematous than on presentation Neuro: Alert and oriented to self, place, and situation Psych: answers questions appropriately    LABS: Basic Metabolic Panel: Recent Labs    10/25/21 0431 10/26/21 0546  NA 140 141  K 4.1 4.2  CL 90* 92*  CO2 38* 39*  GLUCOSE 166* 235*  BUN 50* 52*  CREATININE 1.34* 1.41*  CALCIUM 8.4* 8.5*  MG 2.5* 2.6*  PHOS 4.1 4.1    Liver Function Tests: Recent Labs    10/26/21 0546  ALBUMIN 2.2*    No results for input(s): "LIPASE", "AMYLASE" in the last 72 hours. CBC: Recent Labs    10/25/21 0431 10/26/21 0546  WBC 9.3 7.7  7.7  NEUTROABS  --  5.2  HGB 8.6* 8.7*  8.6*  HCT 29.8* 30.5*  30.4*  MCV 94.9 95.9  95.6  PLT 203  213  212    Cardiac Enzymes: No results for input(s): "CKTOTAL", "CKMB", "CKMBINDEX", "TROPONINI" in the last 72 hours.  BNP: Invalid input(s): "POCBNP" D-Dimer: No results for input(s): "DDIMER" in the last 72 hours. Hemoglobin A1C: No results for input(s): "HGBA1C" in the last 72 hours. Fasting Lipid Panel: No results for input(s): "CHOL", "HDL", "LDLCALC", "TRIG", "CHOLHDL", "LDLDIRECT" in the last 72 hours. Thyroid Function Tests: No results for input(s): "TSH", "T4TOTAL", "T3FREE", "THYROIDAB" in the last 72 hours.  Invalid input(s): "FREET3" Anemia Panel: No results for input(s): "VITAMINB12", "FOLATE", "FERRITIN", "TIBC", "IRON",  "RETICCTPCT" in the last 72 hours.   US THORACENTESIS ASP PLEURAL SPACE W/IMG GUIDE  Result Date: 10/26/2021 INDICATION: Patient with MRSA sepsis and confusion with right-sided pleural effusion request received for diagnostic and therapeutic thoracentesis. EXAM: ULTRASOUND GUIDED RIGHT THORACENTESIS MEDICATIONS: Local 1% lidocaine only. COMPLICATIONS: None immediate. PROCEDURE: An ultrasound guided thoracentesis was thoroughly discussed with the patient and questions answered. The benefits, risks, alternatives and complications were also discussed. The patient understands and wishes to proceed with the procedure. Written consent was obtained. Ultrasound was performed to localize and mark an adequate pocket of fluid in the right chest. The area was then prepped and draped in the normal sterile fashion. 1% Lidocaine was used for local anesthesia. Under ultrasound guidance a 19 gauge, 7-cm, Yueh catheter was introduced. Thoracentesis was performed. The catheter was removed and a dressing applied. FINDINGS: A total of approximately 1.2 L of clear yellow fluid was removed. Samples were sent to the laboratory as requested by the clinical team. IMPRESSION: Successful ultrasound guided right thoracentesis yielding 1.2 L of pleural fluid. This exam was performed by Tsosie Billing PA-C, and was supervised and interpreted by Dr. Denna Haggard. Electronically Signed   By: Albin Felling M.D.   On: 10/26/2021 08:02   DG Chest Port 1 View  Result Date: 10/25/2021 CLINICAL DATA:  Post thoracentesis EXAM: PORTABLE CHEST 1 VIEW COMPARISON:  10/24/2021 FINDINGS: Interval decrease in right pleural effusion. Improved aeration in the right lung base. No pneumothorax. Cardiac enlargement with changes of CABG. Vascular congestion and mild edema compatible with heart failure. Progression of left lower lobe atelectasis and effusion. IMPRESSION: No pneumothorax post right thoracentesis. Improved aeration right lung base Progression of left  lower lobe airspace disease and left effusion Persistent pulmonary edema. Electronically Signed   By: Franchot Gallo M.D.   On: 10/25/2021 15:24     Echo LVEF 55-60% 10/05/2021  TELEMETRY: Atrial flutter 119 bpm  ASSESSMENT AND PLAN:  Principal Problem:   Acute delirium Active Problems:   Essential hypertension, benign   RLS (restless legs syndrome)   Osteomyelitis (HCC)   Obesity, Class III, BMI 40-49.9 (morbid obesity) (HCC)   Acute on chronic diastolic CHF (congestive heart failure) (HCC)   AF (paroxysmal atrial fibrillation) ([place])   Uncontrolled type 2 diabetes mellitus with hypoglycemia, with long-term current use of insulin (HCC)   Malnutrition of moderate degree   Generalized weakness   Acute respiratory failure with hypoxia and hypercapnia (HCC)   OSA (obstructive sleep apnea)   Sepsis due to methicillin resistant Staphylococcus aureus (MRSA) (HCC)   Acute on chronic respiratory failure with hypoxia and hypercapnia (HCC)   Acute urinary retention   Hyperkalemia   Iron deficiency anemia   UTI (urinary tract infection)    1. Acute on chronic diastolic congestive heart failure, HFpEF, LVEF 55-60% 10/05/21, good diuresis after IV furosemide but remains on supplemental O2 2.  Paroxysmal atrial fibrillation/atrial flutter, currently in atrial  flutter heart rate 117 bpm, on amiodarone and metoprolol for rate control and Eliquis for stroke prevention, cardizem '120mg'$  CD 3.  Coronary artery disease, status post CABG 2006, PCI RCA 01/2020, currently without chest pain 4.  Left foot osteomyelitis on IV linezolid, left BKA 6/6 with Dr. Delana Meyer 5.  Acute kidney injury, improving to Cr 1.03 and >60  6.  MRSA bacteremia, osteomyelitis of L foot likely source, on IV daptomycin, repeat echo ordered by ID, resulted with thickened aortic and mitral leaflets without obvious vegetation. Will need 4 weeks of Abx, per ID 7. UTI, culture growing Citrobactr - on cefepime 8.  Respiratory failure  with hypercarbia, worsening on 6/27 on HHFNC requiring transfer to stepdown  9. Delirium, multifactorial - mental status waxing and waning, 2/2 prolonged hospitalization and metabolic derangements   Recommendations   1.  Agree with current therapy, appreciate PCCM assistance 2.  Agree with holding diuretics per PCCM, s/p one dose of acetazolamide, now s/p R thoracentesis with 1.2L fluid removed. 3.  Eliquis '5mg'$  BID restarted 6/9 for stroke risk reduction  4.  Continue amiodarone '200mg'$  once daily for rhythm control. hold metoprolol tartrate '100mg'$  BID and cardizem CD 120 mg d/t hypotension - started on Levophed 6/28 5.  S/p Left BKA 6/6  This patient's plan of care was discussed and created with Dr. Saralyn Pilar and he is in agreement.    Tristan Schroeder, [place] 10/26/2021 4:39 PM

## 2021-10-26 NOTE — Progress Notes (Signed)
OT Cancellation Note  Patient Details Name: [name]. MRN: [id number] DOB: January 07, 1956   Cancelled Treatment:    Reason Eval/Treat Not Completed: Patient not medically ready;Other (comment) (Patient not appropriate for OT intervention at this time. Patient is requiring 45 L02 and levophed.  Critical care NP in agreement. OT will sign off at this time. Please re-consult when appropriate.)  Shanon Payor, OTD OTR/L  10/26/21, 8:49 AM

## 2021-10-26 NOTE — Inpatient Diabetes Management (Addendum)
Inpatient Diabetes Program Recommendations  AACE/ADA: New Consensus Statement on Inpatient Glycemic Control  Target Ranges:  Prepandial:   less than 140 mg/dL      Peak postprandial:   less than 180 mg/dL (1-2 hours)      Critically ill patients:  140 - 180 mg/dL    Latest Reference Range & Units 10/25/21 08:15 10/25/21 11:50 10/25/21 17:08 10/26/21 07:24  Glucose-Capillary 70 - 99 mg/dL 163 (H) 206 (H) 380 (H) 230 (H)   Review of Glycemic Control  Diabetes history: DM2 Outpatient Diabetes medications: Lantus 25 units BID, Humalog 15 units BID, Metformin 1000 mg BID Current orders for Inpatient glycemic control: Semglee 21 units QHS, Novolog 8 units TID with meals, Novolog 0-20 units TID with meals; Solumedrol 40 mg daily  Inpatient Diabetes Program Recommendations:    Insulin: Please consider ordering Novolog 0-5 units QHS for bedtime correction. If steroids are continued as ordered, please consider increasing Semglee to 23 units QHS and meal coverage to Novolog 12 units TID with meals.  Thanks, Barnie Alderman, RN, MSN, [place] Diabetes Coordinator Inpatient Diabetes Program [phone] (Team Pager from 8am to [place])

## 2021-10-26 NOTE — Progress Notes (Signed)
Palliative Care Progress Note, Assessment & Plan   Patient Name: [name].       Date: 10/26/2021 DOB: [birth year]  Age: 66 y.o. MRN#: [id number] Attending Physician: Flora Lipps, MD Primary Care Physician: [name], MD Admit Date: 09/28/2021  Reason for Consultation/Follow-up: Establishing goals of care  Subjective: Patient is sitting in bed in no apparent distress.  He is speaking with his wife Judeen Hammans via Carolee Rota and his granddaughter Ok Edwards and Dr. Mortimer Fries are at bedside.  HPI: 66 y.o. male  with past medical history of HLD, CAD (echo 10/05/2021 with EF of 55 to 60%), HTN, HLD, MI, type 2 diabetes, and left lower extremity osteomyelitis admitted on 09/28/2021 with sepsis due to MRSA.  Patient is being treated for acute on chronic hypoxic respiratory failure secondary to acute diastolic CHF exacerbation, pneumonia, and possible atelectasis or right-sided pleural effusion.    On 10/03/2021 patient had left BKA.   On 6/28, patient had right-sided thoracentesis with removal of 1.2 L of fluid.   Patient remains in ICU due to increased demands for supplemental oxygen and high risk for deterioration and intubation.  Palliative medicine team was consulted to discuss goals of care.  Summary of counseling/coordination of care: After reviewing the patient's chart and assessing the patient at bedside, I joined the goals of care discussion with patient, granddaughter, Dr. Mortimer Fries, and wife Judeen Hammans via telephone.   Education provided on full code, DNR/DNI, CPR, defibrillation, and use of mechanical ventilation.  I then attempted to elicit goals and values important to the patient.  When asked his thoughts on his CODE STATUS, patient states" none of that sounds good to me".  I said in the event that his heart  and his lungs stop would he want [place] to intervene or allow a natural death.  Patient states he would like to speak with his family further but that it does not sound like he would "want any of those machines".  Encouraged patient/family to consider DNR/DNI status understanding evidenced based poor outcomes in similar hospitalized patients, as the cause of the arrest is likely associated with chronic/terminal disease rather than a reversible acute cardio-pulmonary event.   Granddaughter Ok Edwards at bedside asked appropriate questions.  She has a realistic understanding that patient has reached a crossroads in which we need to make advanced care decisions.  Reviewed that BiPAP is helpful for patient's respiratory status but that he must be restrained in order to keep the BiPAP mask on.  Discussed quality of life versus quantity of life.  Reviewed doing things to patient and doing things for the patient.  Therapeutic silence and active listening provided for Carollee Massed, and patient to share their thoughts and emotions regarding patient's current health status.  Patient's wife Judeen Hammans is appropriately tearful and shares that she is just worried.  Patient is leaning towards DNR/DNI but has not reached a final decision.  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  He recalls that he would be swift and quick to put an animal down if needed.  He asked if there was something like that for him.  We discussed avoiding pain, suffering, and lingering with a poor quality of life.  Patient looked me dead in the eyes and said "that is right".  Family plans to continue discussions regarding CODE STATUS and goals of care.  Palliative medicine team will continue to follow the patient throughout his hospitalization.  Patient and family have PMT contact info were encouraged to call with any questions or concerns.  I am on service and plan on rounding on the patient tomorrow as well.     Code Status: Full  code  Prognosis: Unable to determine  Discharge Planning: To Be Determined  Recommendations/Plan:   Care plan was discussed with patient, patient's [name] via [place], patient's granddaughter Ok Edwards, Dr. Mortimer Fries  Physical Exam Vitals and nursing note reviewed.  Constitutional:      Appearance: He is obese. He is ill-appearing.  HENT:     Head: Normocephalic.     Mouth/Throat:     Mouth: Mucous membranes are moist.  Eyes:     Pupils: Pupils are equal, round, and reactive to light.  Cardiovascular:     Rate and Rhythm: Normal rate.     Pulses: Normal pulses.  Pulmonary:     Effort: Pulmonary effort is normal.  Abdominal:     Palpations: Abdomen is soft.  Musculoskeletal:     Comments: Generalized weakness  Skin:    General: Skin is warm and dry.  Neurological:     Mental Status: He is alert and oriented to person, place, and time.     Comments: Intermittent confusion but able to participate in [place] discussions today  Psychiatric:        Mood and Affect: Mood normal.        Behavior: Behavior normal.        Thought Content: Thought content normal.        Judgment: Judgment normal.             Palliative Assessment/Data: 30%    Total Time 35 minutes  Greater than 50%  of this time was spent counseling and coordinating care related to the above assessment and plan.  Thank you for allowing the Palliative Medicine Team to assist in the care of this patient.  Kapolei Ilsa Iha, FNP-BC Palliative Medicine Team Team Phone # [phone]

## 2021-10-26 NOTE — Progress Notes (Addendum)
Date of Admission:  09/28/2021    ID: [name]. is a 66 y.o. male  Principal Problem:   Acute delirium Active Problems:   Essential hypertension, benign   RLS (restless legs syndrome)   Osteomyelitis (HCC)   Obesity, Class III, BMI 40-49.9 (morbid obesity) (HCC)   Acute on chronic diastolic CHF (congestive heart failure) (HCC)   AF (paroxysmal atrial fibrillation) ([place])   Uncontrolled type 2 diabetes mellitus with hypoglycemia, with long-term current use of insulin (HCC)   Malnutrition of moderate degree   Generalized weakness   Acute respiratory failure with hypoxia and hypercapnia (HCC)   OSA (obstructive sleep apnea)   Sepsis due to methicillin resistant Staphylococcus aureus (MRSA) (HCC)   Acute on chronic respiratory failure with hypoxia and hypercapnia (HCC)   Acute urinary retention   Hyperkalemia   Iron deficiency anemia   UTI (urinary tract infection)  69yrmale with h/o DM, HTN, CAD, S/p CABG, CHF, venous edema, lymphedema legs, s/p amputation of multiple toes of both feet, acute heel ulcer left with calcaneal osteomyelitis for which he underwent partial calcanectomy in april 2023 followed by 6 weeks of IV zosyn for kleb, proteus and bacteroides in culture- he completed IV on 09/17/21 and was placed on augmentin following that. [facility] b/l lower extremity swelling and redness of left foot. Blood culture was positive for MRSA 1 of 4 .he was on triple antibiotics- wound culture was positive for MRSA as well He was intermittently confused and found to have co2 retention- had to get BIPAP- he had Left BKA on 10/03/21 As he could not get TEE the plan was to treat him with 4 weeks of IV antibiotic.( 11/01/21) HE was on daptomycin because of CKD- He got IV lasix for volume overload 6/6-6/17. But it had to be resumed because of persistent hypoxia and pulmonary edema Foley cath was placed on 6/11 for urinary retention and flomax was increased, foley  was removed on 6/16 and it looked he was voiding well I had signed off on 10/18/21 . Because of fever a week after removal of cath a urine culture was sent and he was started on IV ceftriaxone- citrobacter grew from culture and was resistant to ceftriaoxne and was changed to cefepime on 10/24/21. Foley cath had tto be replaced for retention . He was transferred to Step down ICU for repsiratory status CXR showed pulmonary edema Because he was on dapto and risk for eosinolphilc pneumonia due to dapto it was discontinued and patient was put on linezolid. Also underwent left pleurocentesis  on 6/28/23and it was a transudate I am asked to come back on his case Chart reviewed completely    Subjective: Says he is feeling better  Medications:   amiodarone  200 mg Oral Daily   apixaban  5 mg Oral BID   vitamin C  500 mg Oral Daily   aspirin EC  81 mg Oral Daily   budesonide (PULMICORT) nebulizer solution  0.5 mg Nebulization BID   chlorhexidine  15 mL Mouth Rinse BID   Chlorhexidine Gluconate Cloth  6 each Topical Daily   cholecalciferol  1,000 Units Oral Daily   ezetimibe  10 mg Oral Daily   ferrous sulfate  325 mg Oral Q breakfast   finasteride  5 mg Oral Daily   insulin aspart  0-20 Units Subcutaneous TID WC   insulin aspart  8 Units Subcutaneous TID WC   insulin glargine-yfgn  21 Units  Subcutaneous QHS   ipratropium-albuterol  3 mL Nebulization Q6H   linezolid  600 mg Oral Q12H   [START ON 10/27/2021] methylPREDNISolone (SOLU-MEDROL) injection  20 mg Intravenous Daily   midodrine  10 mg Oral TID WC   multivitamin with minerals  1 tablet Oral Daily   nystatin   Topical BID   mouth rinse  15 mL Mouth Rinse 4 times per day   primidone  250 mg Oral BID   Ensure Max Protein  11 oz Oral BID   risperiDONE  0.5 mg Oral BID   tamsulosin  0.4 mg Oral BID   vitamin B-12  1,000 mcg Oral Daily    Objective: Vital signs in last 24 hours: Temp:  [98.2 F (36.8 C)-98.5 F (36.9 C)] 98.5 F  (36.9 C) (06/29 0400) Pulse Rate:  [71-117] 114 (06/29 1500) Resp:  [11-19] 12 (06/29 1600) BP: (74-126)/(53-80) 126/73 (06/29 1600) SpO2:  [76 %-100 %] 98 % (06/29 1500) FiO2 (%):  [50 %-73 %] 65 % (06/29 1414)  LDA Foley- 10/24/21   PHYSICAL EXAM:  General: Alert, cooperative, no distress, oriented in person and place, pale , morbid obesity Head: Normocephalic, without obvious abnormality, atraumatic. Eyes: Conjunctivae clear, anicteric sclerae. Pupils are equal ENT Nares normal. No drainage or sinus tenderness. Lips, mucosa, and tongue normal. No Thrush. HI flo oxygen Neck: symmetrical  Lungs: b/l air entry- decreased bases Crepts Heart: Tachycardia. Abdomen: Soft, non-tender,not distended. Bowel sounds normal. No masses Extremities: left bka site- dressing but nurse says it is well coapted and no erythema or discharge Skin: No rashes or lesions. Or bruising Lymph: Cervical, supraclavicular normal. Neurologic: moves all extremities  Lab Results Recent Labs    10/25/21 0431 10/26/21 0546  WBC 9.3 7.7  7.7  HGB 8.6* 8.7*  8.6*  HCT 29.8* 30.5*  30.4*  NA 140 141  K 4.1 4.2  CL 90* 92*  CO2 38* 39*  BUN 50* 52*  CREATININE 1.34* 1.41*   Liver Panel Recent Labs    10/26/21 0546  ALBUMIN 2.2*   Microbiology: 10/21/21 UC citrobacter 6/1 BC- MRSA 1 of 4 10/01/21 left heel ulcer MRSA 10/02/21 BC NG Studies/Results: US THORACENTESIS ASP PLEURAL SPACE W/IMG GUIDE  Result Date: 10/26/2021 INDICATION: Patient with MRSA sepsis and confusion with right-sided pleural effusion request received for diagnostic and therapeutic thoracentesis. EXAM: ULTRASOUND GUIDED RIGHT THORACENTESIS MEDICATIONS: Local 1% lidocaine only. COMPLICATIONS: None immediate. PROCEDURE: An ultrasound guided thoracentesis was thoroughly discussed with the patient and questions answered. The benefits, risks, alternatives and complications were also discussed. The patient understands and wishes to  proceed with the procedure. Written consent was obtained. Ultrasound was performed to localize and mark an adequate pocket of fluid in the right chest. The area was then prepped and draped in the normal sterile fashion. 1% Lidocaine was used for local anesthesia. Under ultrasound guidance a 19 gauge, 7-cm, Yueh catheter was introduced. Thoracentesis was performed. The catheter was removed and a dressing applied. FINDINGS: A total of approximately 1.2 L of clear yellow fluid was removed. Samples were sent to the laboratory as requested by the clinical team. IMPRESSION: Successful ultrasound guided right thoracentesis yielding 1.2 L of pleural fluid. This exam was performed by Tsosie Billing PA-C, and was supervised and interpreted by Dr. Denna Haggard. Electronically Signed   By: Albin Felling M.D.   On: 10/26/2021 08:02   DG Chest Port 1 View  Result Date: 10/25/2021 CLINICAL DATA:  Post thoracentesis EXAM: PORTABLE CHEST 1 VIEW COMPARISON:  10/24/2021 FINDINGS: Interval decrease in right pleural effusion. Improved aeration in the right lung base. No pneumothorax. Cardiac enlargement with changes of CABG. Vascular congestion and mild edema compatible with heart failure. Progression of left lower lobe atelectasis and effusion. IMPRESSION: No pneumothorax post right thoracentesis. Improved aeration right lung base Progression of left lower lobe airspace disease and left effusion Persistent pulmonary edema. Electronically Signed   By: Franchot Gallo M.D.   On: 10/25/2021 15:24    CXR reviewed by me personally Persistent pulmonary edema Assessment/Plan: MRSA bacteremia secondary to left heel ulcer with underlying osteomyelitis. Low bioburden 1 of 4 Repeat blood culture neg Patient was on   Daptomycin S/p BKA on 10/03/21 2 d echo no vegetation- Mitral valve is thickened,no vegetation- unable to do TEE because  of his tenuous resp status Plan was  to give daptomycin for  4 weeks until  11/01/21. Because of worsening  resp status and concern that dapto can cause eosinophilic pneumonia it was discontinued and patient is on IV linezolid for 1 more week Can change to PO because of not having enough IV access   UTI due to urinary retention- he now has a foley- will do 3 days of cefepime  Acute hypoxic/hypercapnic  resp failure- combination of CHF, hypoventilation syndrome,OSA , co2 retention  r/o amiodarone induced pneumonitis and dapto induced eosinophilic pneumonia Dapto dc on 6/27 CHF- b/l pulm edema and pleural effusion s/p rt thoracentesis     AKI   Encephalopathy.  Multifactorial--predominantly due to CO2 narcosis/ . On intermittent BIPAP. Has improved a lot  Severe venous edema of both legs  DM- on insulin  Paroxysmal A-fib with atrial flutter.  On amiodarone metoprolol and Eliquis  Coronary artery disease status post CABG   Discussed the management with  patient and care team

## 2021-10-26 NOTE — IPAL (Signed)
  Interdisciplinary Goals of Care Family Meeting   Date carried out: 10/26/2021  Location of the meeting: Bedside  Member's involved: Physician, Family Member or next of kin, and Palliative care team member     GOALS OF CARE DISCUSSION  The Clinical status was relayed to family in detail-Wife on the phone Granddaughter at bedside   Updated and notified of patients medical condition- Explained to family course of therapy and the modalities  Morbid obesity with infections and poor quality of life Patient has declined since amputation Patient would NOT want NH  Patient with Progressive multiorgan failure with a very high probablity of a very minimal chance of meaningful recovery despite all aggressive and optimal medical therapy.  PATIENT REMAINS FULL CODE  Family understands the situation but I strongly urged them to make [place] decisions regarding CPR and vent support   Family are satisfied with Plan of action and management. All questions answered  Additional CC time 35 mins   Malyah Ohlrich Patricia Pesa, M.D.  [facility]  Medical Director [place] Director [facility] Cardio-Pulmonary Department

## 2021-10-26 NOTE — Progress Notes (Signed)
NAME:  Alec Torti., MRN:  [id number], DOB:  [birth year], LOS: 38 ADMISSION DATE:  09/28/2021, CONSULTATION DATE: 10/24/2021 REFERRING MD: Dr. Delana Meyer, CHIEF COMPLAINT: Hypoxia   History of Present Illness:  This is a 66 yo male who presented to [facility] ER on 06/1 via EMS from home with c/o left lower extremity erythema, warmth, and discomfort to the left lower leg.  The pt also had concerns regarding fluid overload due to increased swelling.  He reported he went to the heart failure clinic 2 days prior to ER presentation and was given iv lasix.  He previously has had a partial calcanectomy and has been followed by podiatry and the wound care clinic with wound vac in place.    ED Course  Upon arrival to the ER MR Left Foot revealed substantially increased osteomyelitis of the calcaneus; partial rupture of the plantar fascia; a large plantar heel ulcer extending to the bony margin of the calcaneus; and diffuse cellulitis along the left heel.  On call podiatrist Dr. Cleda Mccreedy consulted by ER provider.  Pt received ceftriaxone and flagyl.  CXR obtained and revealed pulmonary edema, therefore pt received iv lasix and placed on oxygen.  He was subsequently admitted to the telemetry unit for additional workup and treatment.  See detailed hospital course listed under significant events.    Pertinent  Medical History  Atrial Fibrillation  Asthma  COPD CAD Depression  Type II Diabetes Mellitus Gout  Anabolic Steroid Use HLD HTN Hypogonadism in Male Morbid Obesity MI PVD Perirectal Abscess  Pluerisy  OSA~CPAP qhs Varicella Chronic Diastolic CHF   Significant Hospital Events: Including procedures, antibiotic start and stop dates in addition to other pertinent events   06/1: Pt admitted to the [facility] unit with acute on chronic diastolic CHF and left heel osteomyelitis  06/2: Podiatry consulted for left foot osteomyelitis and felt left foot not salvageable and will require higher amputation.   Vascular surgery consulted  06/4: ID consulted for MRSA bacteremia likely source left heel wound  06/6: Pt underwent left BKA per vascular surgery  06/6 to 06/17: Pt with worsening acute on chronic hypoxic respiratory failure secondary to volume overload due to diastolic CHF.  CXR revealed new pleural effusion.  IV lasix resumed.  Requiring Bipap or [name]  06/21 to 06/27: mentation waxed and waned requiring Bipap during the day due to hypoxia  06/27: Pt requiring 100% HHFNC and transfer to the stepdown unit.  PCCM team consulted to assist with management  6/28: Pt tolerating [place] @ 50L/85%; Underwent Right-sided thoracentesis with removal of 1.2L of pleural fluid 6/29: Tolerating 45L/70% FiO2 with O2 sats 100%; remains on levophed gtt '@4'$  mcg/min to maintain map >65  Cultures:   6/1 BCx: 1/4 bottles MRSA 6/4 L Foot Wound: MRSA 6/5 BCx: NG 6/24 UCx: 60k CFU / mL Citrobacter freundii  6/28: Pleural fluid:   Antimicrobials:   Zosyn 6/1 x 1 Vancomycin 6/1 x 1, 6/3 >> 6/6 Metronidazole 6/2 >> 6/6 Ceftriaxone 6/2 >> 6/5, 6/25 >> 6/27 Daptomycin 6/7 >>6/27  Bactrim 6/27 x 1 Cefepime 6/27 >> Linezolid 6/27>>  Interim History / Subjective:  As outlined above under significant events   Objective   Blood pressure 121/76, pulse (!) 115, temperature 98.5 F (36.9 C), temperature source Axillary, resp. rate 16, height '6\' 4"'$  (1.93 m), weight (!) 157.8 kg, SpO2 99 %.    FiO2 (%):  [50 %-75 %] 50 %   Intake/Output Summary (Last 24 hours) at 10/26/2021 6387 Last data  filed at 10/26/2021 2595 Gross per 24 hour  Intake 1778.83 ml  Output 1375 ml  Net 403.83 ml   Filed Weights   10/21/21 0500 10/23/21 0500 10/25/21 0500  Weight: (!) 159.8 kg (!) 157.2 kg (!) 157.8 kg    Examination: General: Acute on chronically ill appearing male, NAD on HHFNC HENT: Supple, no JVD  Lungs: Diminished throughout, even, non labored  Cardiovascular: Sinus tach, rrr, no r/g, 2+ radial/1+ distal  pulses Abdomen: +BS x4, obese, soft, non tender  Extremities: Left BKA, chronic RLE vascular changes  Neuro: Alert disoriented to situation/time, follows commands, PERRLA GU: Indwelling foley catheter draining yellow urine   Resolved Hospital Problem list     Assessment & Plan:  Acute on chronic hypoxic respiratory failure secondary to acute diastolic CHF exacerbation, pneumonia, and right-sided pleural effusion s/p right sided thoracentesis w/ removal of 1.2L of pleural fluid 10/25/21 OSA Hx: Asthma and Obesity  - Supplemental O2 for dyspnea and/or hypoxia  - Bipap qhs  - Continue abx as outlined above  - Scheduled and prn bronchodilator therapy  - Nebulized steroids  - HIGH RISK FOR INTUBATION   Acute on chronic diastolic CHF Paroxysmal atrial fibrillation  Hypotension suspect secondary to cardiac medications  Hx: HLD, CAD, HTN, HLD, and MI Echo 10/05/21 revealed EF 55 to 60% - Continuous telemetry monitoring  - Discontinue iv lasix due to severe alkalosis and worsening encephalopathy if diuresis needed will give diamox  - Cardiology consulted appreciate input: continue amiodarone, apixaban, aspirin, ezetimibe, and finasteride - Hold outpatient diltiazem and metoprolol due to hypotension - Prn levophed gtt to maintain map >65   Left lower extremity osteomyelitis s/p left BKA 06/6 Pneumonia  MRSA bacteremia secondary osteomyelitis  Citrobacter freundii UTI  - Trend WBC and monitor fever curve  - Trend PCT and lactic acid  - Abx as outlined above   Urinary retention - Strict I&O's - Continue flomax   Type II Diabetes Mellitus  - CBG's ac/hs - SSI, scheduled novolog 4 units 3 times a day with meals, and semglee   Acute encephalopathy multifactorial secondary to infectious process and metabolic derangements - Frequent reorientation  - Continue seroquel, risperdal, and prn haldol  Best Practice (right click and "Reselect all SmartList Selections" daily)   Diet/type:  Regular consistency (see orders) DVT prophylaxis: apixaban  GI prophylaxis: [name] Lines: [name] Foley:  Yes, and it is still needed Code Status:  full code Last date of multidisciplinary goals of care discussion [10/26/2021]  Labs   CBC: Recent Labs  Lab 10/20/21 0719 10/23/21 0554 10/24/21 0638 10/25/21 0431 10/26/21 0546  WBC  --  11.8* 10.3 9.3 7.7  HGB 10.2* 9.0* 9.0* 8.6* 8.6*  HCT  --  31.4* 30.6* 29.8* 30.4*  MCV  --  96.0 94.2 94.9 95.6  PLT  --  228 192 203 638    Basic Metabolic Panel: Recent Labs  Lab 10/22/21 0556 10/23/21 0554 10/24/21 0638 10/25/21 0431 10/26/21 0546  NA 140 141 143 140 141  K 4.5 4.1 4.3 4.1 4.2  CL 91* 90* 93* 90* 92*  CO2 42* 41* 40* 38* 39*  GLUCOSE 120* 140* 136* 166* 235*  BUN 34* 39* 40* 50* 52*  CREATININE 1.02 1.06 1.06 1.34* 1.41*  CALCIUM 8.7* 8.5* 8.4* 8.4* 8.5*  MG  --   --   --  2.5* 2.6*  PHOS  --   --   --  4.1 4.1   GFR: Estimated Creatinine Clearance: 84 mL/min (A) (  by C-G formula based on SCr of 1.41 mg/dL (H)). Recent Labs  Lab 10/23/21 0554 10/24/21 0638 10/24/21 1806 10/25/21 0431 10/26/21 0546  PROCALCITON  --   --  0.27  --   --   WBC 11.8* 10.3  --  9.3 7.7  LATICACIDVEN  --   --  0.9  --   --     Liver Function Tests: No results for input(s): "AST", "ALT", "ALKPHOS", "BILITOT", "PROT", "ALBUMIN" in the last 168 hours. No results for input(s): "LIPASE", "AMYLASE" in the last 168 hours. No results for input(s): "AMMONIA" in the last 168 hours.  ABG    Component Value Date/Time   PHART 7.45 10/24/2021 1804   PCO2ART 74 (HH) 10/24/2021 1804   PO2ART 60 (L) 10/24/2021 1804   HCO3 51.4 (H) 10/24/2021 1804   O2SAT 91.5 10/24/2021 1804     Coagulation Profile: No results for input(s): "INR", "PROTIME" in the last 168 hours.  Cardiac Enzymes: Recent Labs  Lab 10/23/21 0554  CKTOTAL 19*    HbA1C: Hgb A1c MFr Bld  Date/Time Value Ref Range Status  08/05/2021 04:27 AM 10.2 (H) 4.8 - 5.6 %  Final    Comment:    (NOTE) Pre diabetes:          5.7%-6.4%  Diabetes:              >6.4%  Glycemic control for   <7.0% adults with diabetes   05/09/2021 07:20 AM 9.2 (H) 4.8 - 5.6 % Final    Comment:    (NOTE) Pre diabetes:          5.7%-6.4%  Diabetes:              >6.4%  Glycemic control for   <7.0% adults with diabetes     CBG: Recent Labs  Lab 10/24/21 2235 10/25/21 0815 10/25/21 1150 10/25/21 1708 10/26/21 0724  GLUCAP 175* 163* 206* 380* 230*    Review of Systems: Positives in BOLD   Gen: Denies fever, chills, weight change, fatigue, night sweats HEENT: Denies blurred vision, double vision, hearing loss, tinnitus, sinus congestion, rhinorrhea, sore throat, neck stiffness, dysphagia PULM: Denies shortness of breath, cough, sputum production, hemoptysis, wheezing CV: Denies chest pain, edema, orthopnea, paroxysmal nocturnal dyspnea, palpitations GI: Denies abdominal pain, nausea, vomiting, diarrhea, hematochezia, melena, constipation, change in bowel habits GU: Denies dysuria, hematuria, polyuria, oliguria, urethral discharge Endocrine: Denies hot or cold intolerance, polyuria, polyphagia or appetite change Derm: Denies rash, dry skin, scaling or peeling skin change Heme: Denies easy bruising, bleeding, bleeding gums Neuro: Denies headache, numbness, weakness, slurred speech, loss of memory or consciousness  Past Medical History:  He,  has a past medical history of Arrhythmia, Asthma, CHF (congestive heart failure) ([place]), COPD (chronic obstructive pulmonary disease) ([place]), Coronary artery disease, Depression, Diabetes mellitus without complication ([place]), Gout, History anabolic steroid use, Hyperlipidemia, Hypertension, Hypogonadism in male, Morbid obesity ([place]), Myocardial infarction [facility]), Peripheral vascular disease ([place]), Perirectal abscess, Pleurisy, Sleep apnea, and Varicella.   Surgical History:   Past Surgical History:  Procedure Laterality Date    ABDOMINAL AORTIC ANEURYSM REPAIR     ACHILLES TENDON SURGERY Left 01/10/2021   Procedure: ACHILLES LENGTHENING/KIDNER;  Surgeon: Caroline More, DPM;  Location: [facility] ORS;  Service: Podiatry;  Laterality: Left;   AMPUTATION Left 10/03/2021   Procedure: AMPUTATION BELOW KNEE;  Surgeon: Katha Cabal, MD;  Location: [facility] ORS;  Service: Vascular;  Laterality: Left;   AMPUTATION TOE Right 02/10/2016   Procedure: AMPUTATION TOE 3RD  TOE;  Surgeon: Samara Deist, DPM;  Location: [facility] ORS;  Service: Podiatry;  Laterality: Right;   AMPUTATION TOE Left 02/24/2020   Procedure: AMPUTATION TOE;  Surgeon: Caroline More, DPM;  Location: [facility] ORS;  Service: Podiatry;  Laterality: Left;   APPLICATION OF WOUND VAC Left 02/29/2020   Procedure: APPLICATION OF WOUND VAC;  Surgeon: Caroline More, DPM;  Location: [facility] ORS;  Service: Podiatry;  Laterality: Left;   COLONOSCOPY WITH PROPOFOL [name] 11/18/2015   Procedure: COLONOSCOPY WITH PROPOFOL;  Surgeon: Manya Silvas, MD;  Location[facility] ENDOSCOPY;  Service: Endoscopy;  Laterality: [name];   CORONARY ARTERY BYPASS GRAFT     CORONARY STENT INTERVENTION [name] 02/02/2020   Procedure: CORONARY STENT INTERVENTION;  Surgeon: Isaias Cowman, MD;  Location: [place] CV LAB;  Service: Cardiovascular;  Laterality: [name];   INCISION AND DRAINAGE Left 08/07/2021   Procedure: INCISION AND DRAINAGE-Partial Calcanectomy;  Surgeon: Caroline More, DPM;  Location: [facility] ORS;  Service: Podiatry;  Laterality: Left;   IRRIGATION AND DEBRIDEMENT FOOT Left 02/29/2020   Procedure: IRRIGATION AND DEBRIDEMENT FOOT;  Surgeon: Caroline More, DPM;  Location: [facility] ORS;  Service: Podiatry;  Laterality: Left;   IRRIGATION AND DEBRIDEMENT FOOT Left 02/24/2020   Procedure: IRRIGATION AND DEBRIDEMENT FOOT;  Surgeon: Caroline More, DPM;  Location: [facility] ORS;  Service: Podiatry;  Laterality: Left;   KNEE ARTHROSCOPY     LEFT HEART CATH AND CORS/GRAFTS ANGIOGRAPHY [name] 02/02/2020   Procedure: LEFT HEART  CATH AND CORS/GRAFTS ANGIOGRAPHY;  Surgeon: Teodoro Spray, MD;  Location: [place] CV LAB;  Service: Cardiovascular;  Laterality: [name];   LOWER EXTREMITY ANGIOGRAPHY Left 02/25/2020   Procedure: Lower Extremity Angiography;  Surgeon: Algernon Huxley, MD;  Location: [place] CV LAB;  Service: Cardiovascular;  Laterality: Left;   LOWER EXTREMITY ANGIOGRAPHY Left 01/04/2021   Procedure: LOWER EXTREMITY ANGIOGRAPHY;  Surgeon: Algernon Huxley, MD;  Location: [place] [facility];  Service: Cardiovascular;  Laterality: Left;   METATARSAL HEAD EXCISION Left 01/10/2021   Procedure: METATARSAL HEAD EXCISION - LEFT 5th;  Surgeon: Caroline More, DPM;  Location: [facility] ORS;  Service: Podiatry;  Laterality: Left;   PERIPHERAL VASCULAR CATHETERIZATION Right 01/24/2016   Procedure: Lower Extremity Angiography;  Surgeon: Katha Cabal, MD;  Location: [place] CV LAB;  Service: Cardiovascular;  Laterality: Right;   PERIPHERAL VASCULAR CATHETERIZATION Right 01/25/2016   Procedure: Lower Extremity Angiography;  Surgeon: Katha Cabal, MD;  Location: [place] CV LAB;  Service: Cardiovascular;  Laterality: Right;   TOE AMPUTATION     TONSILLECTOMY       Social History:   reports that he quit smoking about 6 years ago. His smoking use included cigarettes. He has a 22.50 pack-year smoking history. He has never used smokeless tobacco. He reports that he does not currently use alcohol after a past usage of about 3.0 standard drinks of alcohol per week. He reports that he does not use drugs.   Family History:  His family history includes Alcohol abuse in his father; Coronary artery disease in his father; Heart failure in his brother; Hypertension in his father.   Allergies Allergies  Allergen Reactions   Penicillins Other (See Comments)    Happened at 66 years old and pt. stated he passed out  He has tolerated amoxicillin/clavulanate and ampicillin/sulbactam   Statins     Other reaction(s): Muscle  Pain Causes legs to ache per pt     Home Medications  Prior to Admission medications   Medication Sig Start  Date End Date Taking? Authorizing Provider  amiodarone (PACERONE) 200 MG tablet Take 200 mg by mouth daily.   Yes [provider]  amoxicillin-clavulanate (AUGMENTIN) 875-125 MG tablet Take 1 tablet by mouth 2 (two) times daily. 09/18/21  Yes Tsosie Billing, MD  apixaban (ELIQUIS) 5 MG TABS tablet Take 1 tablet (5 mg total) by mouth 2 (two) times daily. 01/26/16  Yes Stegmayer, Janalyn Harder, PA-C  aspirin EC 81 MG EC tablet Take 1 tablet (81 mg total) by mouth daily. Swallow whole. 05/17/21  Yes Dessa Phi, DO  Cholecalciferol 25 MCG (1000 UT) tablet Take 1,000 Units by mouth daily.   Yes [provider]  Cyanocobalamin (VITAMIN B-12) 5000 MCG TBDP Take 10,000 mcg by mouth daily.   Yes [provider]  diltiazem (CARDIZEM CD) 300 MG 24 hr capsule Take 1 capsule (300 mg total) by mouth daily. 01/16/21  Yes Bonnielee Haff, MD  ezetimibe (ZETIA) 10 MG tablet Take 10 mg by mouth at bedtime.   Yes [provider]  ferrous sulfate 325 (65 FE) MG tablet Take 325 mg by mouth daily with breakfast.   Yes [provider]  gabapentin (NEURONTIN) 300 MG capsule Take 300 mg by mouth at bedtime. 12/02/20  Yes [provider]  HUMALOG 100 UNIT/ML injection Inject 15 Units into the skin in the morning and at bedtime. Sliding scale 08/18/21  Yes [provider]  insulin glargine (LANTUS) 100 UNIT/ML Solostar Pen Inject 25 Units into the skin in the morning and at bedtime. 08/11/21  Yes Fritzi Mandes, MD  isosorbide mononitrate (IMDUR) 60 MG 24 hr tablet Take 60 mg by mouth 2 (two) times daily. 04/21/21  Yes [provider]  losartan (COZAAR) 50 MG tablet Take 50 mg by mouth daily.   Yes [provider]  metFORMIN (GLUCOPHAGE) 1000 MG tablet Take 1,000 mg by mouth 2 (two) times daily.  12/02/17  Yes [provider]   metoprolol tartrate (LOPRESSOR) 50 MG tablet Take 100 mg by mouth 2 (two) times daily. 04/21/21  Yes [provider]  Multiple Vitamins-Minerals (MULTIVITAMIN WITH MINERALS) tablet Take 1 tablet by mouth daily.   Yes [provider]  nitroGLYCERIN (NITROSTAT) 0.4 MG SL tablet Place 0.4 mg under the tongue every 5 (five) minutes as needed for chest pain.   Yes [provider]  pramipexole (MIRAPEX) 1 MG tablet Take 2 mg by mouth at bedtime. 11/09/20  Yes [provider]  primidone (MYSOLINE) 250 MG tablet Take 250 mg by mouth 2 (two) times daily. 01/23/20  Yes [provider]  tamsulosin (FLOMAX) 0.4 MG CAPS capsule Take 0.4 mg by mouth daily.   Yes [provider]  torsemide (DEMADEX) 20 MG tablet Take 3 tablets (60 mg total) by mouth daily. 09/26/21  Yes Hackney, Otila Kluver A, FNP  traMADol (ULTRAM) 50 MG tablet Take 1 tablet (50 mg total) by mouth daily as needed for moderate pain. 08/11/21  Yes Fritzi Mandes, MD  vitamin C (ASCORBIC ACID) 500 MG tablet Take 500 mg by mouth daily.   Yes [provider]  zinc gluconate 50 MG tablet Take 50 mg by mouth daily.   Yes [provider]  Continuous Blood Gluc Sensor (FREESTYLE LIBRE 2 SENSOR) MISC  09/08/21   [provider]     Critical care time: 30 minutes      Donell Beers, [place] Pager [phone] (please enter 7 digits) PCCM Consult Pager [phone] (please enter 7 digits)

## 2021-10-26 NOTE — Progress Notes (Signed)
[place] for Electrolyte Monitoring and Replacement   Recent Labs: Potassium (mmol/L)  Date Value  10/26/2021 4.2  07/12/2011 4.4   Magnesium (mg/dL)  Date Value  10/26/2021 2.6 (H)   Calcium (mg/dL)  Date Value  10/26/2021 8.5 (L)   Calcium, Total (mg/dL)  Date Value  07/12/2011 8.8   Albumin (g/dL)  Date Value  10/26/2021 2.2 (L)   Phosphorus (mg/dL)  Date Value  10/26/2021 4.1   Sodium (mmol/L)  Date Value  10/26/2021 141  07/12/2011 141   Assessment: 30 YOM admitted with wound infection. Found to have osteomyelitis of L heel. PMH significant for uncontrolled G6KZ, chronic diastolic dysfunction CHF, HTN, CAD, COPD, afib, morbid obesity, OSA, s/p recent wound debridement and partial calcanectomy (05/16). Pharmacy is asked to follow and replace electrolytes while in the CCU  Goal of Therapy:  Potassium 4.0 - 5.1 mmol/L Magnesium 2.0 - 2.4 mg/dL All Other Electrolytes WNL  Plan:  No electrolyte replacement warranted for today Recheck electrolytes in AM 6/30  Benita Gutter 10/26/2021 2:29 PM

## 2021-10-26 NOTE — Progress Notes (Signed)
PT Cancellation Note  Patient Details Name: [name]. MRN: [id number] DOB: 06/23/1955   Cancelled Treatment:    Reason Eval/Treat Not Completed: Medical issues which prohibited therapy. Patient remains in ICU requiring 45 L02 and levophed. Given ongoing medical issues, patient not appropriate for PT intervention at this time. Discussed with critical care NP. PT will sign off at this time. Please re-consult when appropriate.   Minna Merritts, PT, MPT  Percell Locus 10/26/2021, 8:37 AM

## 2021-10-27 ENCOUNTER — Inpatient Hospital Stay: Payer: Self-pay

## 2021-10-27 DIAGNOSIS — M869 Osteomyelitis, unspecified: Secondary | ICD-10-CM | POA: Diagnosis not present

## 2021-10-27 DIAGNOSIS — J9621 Acute and chronic respiratory failure with hypoxia: Secondary | ICD-10-CM | POA: Diagnosis not present

## 2021-10-27 DIAGNOSIS — B9562 Methicillin resistant Staphylococcus aureus infection as the cause of diseases classified elsewhere: Secondary | ICD-10-CM | POA: Diagnosis not present

## 2021-10-27 DIAGNOSIS — R410 Disorientation, unspecified: Secondary | ICD-10-CM | POA: Diagnosis not present

## 2021-10-27 DIAGNOSIS — I5033 Acute on chronic diastolic (congestive) heart failure: Secondary | ICD-10-CM | POA: Diagnosis not present

## 2021-10-27 DIAGNOSIS — I509 Heart failure, unspecified: Secondary | ICD-10-CM | POA: Diagnosis not present

## 2021-10-27 DIAGNOSIS — J9601 Acute respiratory failure with hypoxia: Secondary | ICD-10-CM | POA: Diagnosis not present

## 2021-10-27 DIAGNOSIS — R7881 Bacteremia: Secondary | ICD-10-CM | POA: Diagnosis not present

## 2021-10-27 LAB — CBC WITH DIFFERENTIAL/PLATELET
Abs Immature Granulocytes: 0.04 10*3/uL (ref 0.00–0.07)
Basophils Absolute: 0.1 10*3/uL (ref 0.0–0.1)
Basophils Relative: 1 %
Eosinophils Absolute: 0.9 10*3/uL — ABNORMAL HIGH (ref 0.0–0.5)
Eosinophils Relative: 12 %
HCT: 30.5 % — ABNORMAL LOW (ref 39.0–52.0)
Hemoglobin: 8.8 g/dL — ABNORMAL LOW (ref 13.0–17.0)
Immature Granulocytes: 1 %
Lymphocytes Relative: 14 %
Lymphs Abs: 1 10*3/uL (ref 0.7–4.0)
MCH: 27.2 pg (ref 26.0–34.0)
MCHC: 28.9 g/dL — ABNORMAL LOW (ref 30.0–36.0)
MCV: 94.1 fL (ref 80.0–100.0)
Monocytes Absolute: 0.4 10*3/uL (ref 0.1–1.0)
Monocytes Relative: 6 %
Neutro Abs: 4.9 10*3/uL (ref 1.7–7.7)
Neutrophils Relative %: 66 %
Platelets: 236 10*3/uL (ref 150–400)
RBC: 3.24 MIL/uL — ABNORMAL LOW (ref 4.22–5.81)
RDW: 16.4 % — ABNORMAL HIGH (ref 11.5–15.5)
WBC: 7.3 10*3/uL (ref 4.0–10.5)
nRBC: 0 % (ref 0.0–0.2)

## 2021-10-27 LAB — VITAMIN B12: Vitamin B-12: 2327 pg/mL — ABNORMAL HIGH (ref 180–914)

## 2021-10-27 LAB — CYTOLOGY - NON PAP

## 2021-10-27 LAB — BLOOD GAS, ARTERIAL
Acid-Base Excess: 14.6 mmol/L — ABNORMAL HIGH (ref 0.0–2.0)
Bicarbonate: 39.9 mmol/L — ABNORMAL HIGH (ref 20.0–28.0)
Delivery systems: POSITIVE
Expiratory PAP: 8 cmH2O
FIO2: 50 %
Inspiratory PAP: 16 cmH2O
Mechanical Rate: 14
O2 Saturation: 95.9 %
Patient temperature: 37
pCO2 arterial: 50 mmHg — ABNORMAL HIGH (ref 32–48)
pH, Arterial: 7.51 — ABNORMAL HIGH (ref 7.35–7.45)
pO2, Arterial: 67 mmHg — ABNORMAL LOW (ref 83–108)

## 2021-10-27 LAB — BASIC METABOLIC PANEL
Anion gap: 11 (ref 5–15)
BUN: 38 mg/dL — ABNORMAL HIGH (ref 8–23)
CO2: 36 mmol/L — ABNORMAL HIGH (ref 22–32)
Calcium: 8.3 mg/dL — ABNORMAL LOW (ref 8.9–10.3)
Chloride: 92 mmol/L — ABNORMAL LOW (ref 98–111)
Creatinine, Ser: 0.95 mg/dL (ref 0.61–1.24)
GFR, Estimated: >60 mL/min (ref >60)
Glucose, Bld: 234 mg/dL — ABNORMAL HIGH (ref 70–99)
Potassium: 4.2 mmol/L (ref 3.5–5.1)
Sodium: 139 mmol/L (ref 135–145)

## 2021-10-27 LAB — CBC
HCT: 31.1 % — ABNORMAL LOW (ref 39.0–52.0)
Hemoglobin: 8.9 g/dL — ABNORMAL LOW (ref 13.0–17.0)
MCH: 26.7 pg (ref 26.0–34.0)
MCHC: 28.6 g/dL — ABNORMAL LOW (ref 30.0–36.0)
MCV: 93.4 fL (ref 80.0–100.0)
Platelets: 217 10*3/uL (ref 150–400)
RBC: 3.33 MIL/uL — ABNORMAL LOW (ref 4.22–5.81)
RDW: 16.5 % — ABNORMAL HIGH (ref 11.5–15.5)
WBC: 6.9 10*3/uL (ref 4.0–10.5)
nRBC: 0 % (ref 0.0–0.2)

## 2021-10-27 LAB — GLUCOSE, CAPILLARY
Glucose-Capillary: 243 mg/dL — ABNORMAL HIGH (ref 70–99)
Glucose-Capillary: 276 mg/dL — ABNORMAL HIGH (ref 70–99)
Glucose-Capillary: 150 mg/dL — ABNORMAL HIGH (ref 70–99)
Glucose-Capillary: 171 mg/dL — ABNORMAL HIGH (ref 70–99)

## 2021-10-27 LAB — MAGNESIUM: Magnesium: 2.4 mg/dL (ref 1.7–2.4)

## 2021-10-27 LAB — AMMONIA: Ammonia: <10 umol/L (ref 9–35)

## 2021-10-27 LAB — TSH: TSH: 4.833 u[IU]/mL — ABNORMAL HIGH (ref 0.350–4.500)

## 2021-10-27 LAB — PHOSPHORUS: Phosphorus: 2.4 mg/dL — ABNORMAL LOW (ref 2.5–4.6)

## 2021-10-27 MED ORDER — INSULIN ASPART 100 UNIT/ML IJ SOLN
10.0000 [IU] | Freq: Three times a day (TID) | INTRAMUSCULAR | Status: DC
Start: 2021-10-28 — End: 2021-11-07
  Administered 2021-10-28 – 2021-11-07 (×29): 10 [IU] via SUBCUTANEOUS
  Filled 2021-10-27 (×25): qty 1

## 2021-10-27 MED ORDER — SODIUM CHLORIDE 0.9% FLUSH
10.0000 mL | Freq: Two times a day (BID) | INTRAVENOUS | Status: DC
  Administered 2021-10-27: 20 mL
  Administered 2021-10-28 – 2021-11-04 (×15): 10 mL
  Administered 2021-11-04: 20 mL
  Administered 2021-11-05 – 2021-11-11 (×13): 10 mL

## 2021-10-27 MED ORDER — IPRATROPIUM-ALBUTEROL 0.5-2.5 (3) MG/3ML IN SOLN
3.0000 mL | Freq: Two times a day (BID) | RESPIRATORY_TRACT | Status: DC
  Administered 2021-10-27 – 2021-11-11 (×30): 3 mL via RESPIRATORY_TRACT
  Filled 2021-10-27 (×31): qty 3

## 2021-10-27 MED ORDER — DIAZEPAM 5 MG PO TABS
5.0000 mg | ORAL_TABLET | Freq: Once | ORAL | Status: AC
  Administered 2021-10-27: 5 mg via ORAL
  Filled 2021-10-27: qty 1

## 2021-10-27 MED ORDER — K PHOS MONO-SOD PHOS DI & MONO 155-852-130 MG PO TABS
500.0000 mg | ORAL_TABLET | Freq: Once | ORAL | Status: AC
Start: 2021-10-27 — End: 2021-10-27
  Administered 2021-10-27: 500 mg via ORAL
  Filled 2021-10-27: qty 2

## 2021-10-27 MED ORDER — INSULIN GLARGINE-YFGN 100 UNIT/ML ~~LOC~~ SOLN
25.0000 [IU] | Freq: Every day | SUBCUTANEOUS | Status: DC
  Administered 2021-10-27 – 2021-11-12 (×17): 25 [IU] via SUBCUTANEOUS
  Filled 2021-10-27 (×20): qty 0.25

## 2021-10-27 MED ORDER — SODIUM CHLORIDE 0.9% FLUSH: 10.0000 mL | INTRAVENOUS | Status: DC | PRN

## 2021-10-27 NOTE — Progress Notes (Signed)
BiPAP in room. RN instructed to call respiratory when patient is ready to go to sleep, so RRT can place patient on machine.

## 2021-10-27 NOTE — Progress Notes (Addendum)
PIV/ midline consult: Per RN, pt has removed several PIVs. Currently not receiving any IV medications. Recommended waiting until IV med is needed, ICU RN was not comfortable with this. RN requested IV site in upper arm so pt will be less likely to remove. Midline not appropriate at this time. Long 24g placed in superficial vein L upper arm.

## 2021-10-27 NOTE — Progress Notes (Addendum)
Palliative Care Progress Note, Assessment & Plan   Patient Name: [name].       Date: 10/27/2021 DOB: 1955-12-17  Age: 66 y.o. MRN#: [id number] Attending Physician: Flora Lipps, MD Primary Care Physician: [name], MD Admit Date: 09/28/2021  Reason for Consultation/Follow-up: Establishing goals of care  Subjective: Patient is lying in bed in no apparent distress.  BiPAP is in place but patient continues to fidget with lines and tubes.  No family present at bedside.  Patient acknowledges my presence but does not verbalize anything to me.  HPI: 66 y.o. male  with past medical history of HLD, CAD (echo 10/05/2021 with EF of 55 to 60%), HTN, HLD, MI, type 2 diabetes, and left lower extremity osteomyelitis admitted on 09/28/2021 with sepsis due to MRSA.  Patient is being treated for acute on chronic hypoxic respiratory failure secondary to acute diastolic CHF exacerbation, pneumonia, and possible atelectasis or right-sided pleural effusion.     On 10/03/2021 patient had left BKA.   On 6/28, patient had right-sided thoracentesis with removal of 1.2 L of fluid.   Patient remains in ICU due to increased demands for supplemental oxygen and high risk for deterioration and intubation.  Palliative medicine team was consulted to discuss goals of care.  Summary of counseling/coordination of care: After reviewing the patient's chart and assessing the patient at bedside, I spoke with patient's [name] over the phone.  I reviewed patient is having increased need for BiPAP but is also continuing to pull at it/attempt to remove it.  Patient's wife shared she is overwhelmed.  She is requesting paperwork from CCM in regards to temporary guardianship.  I clarified that if patient does not have capacity to  make his own decisions then legally medical decision making would fall to her, patient's wife.  Wife shared she knows that she will make his medical decisions if he is unable to participate in them.  However, she shares there are other things that she wants to be able to take care of.  I reiterated several times that we are unable to provide documentation to support temporary guardianship as she is already the next of kin/decision-maker for patient as far as medical decisions.  Patient's wife shared she is insistent that someone give her paperwork so that she can attend a guardianship hearing on Monday at 10 AM.  I shared that I would pass along her request to the critical care team.  I attempted to speak about patient's current health status and discuss his high risk for intubation. [name] declined to speak with me further.   Social work and nursing made aware of patient's wife's request.    Palliative medicine team will continue to follow the patient throughout his hospitalization.  Code Status: Full code  Prognosis: Unable to determine  Discharge Planning: To Be Determined  Care plan was discussed with patient, RN Legrand Como, patient's [name]  Physical Exam Vitals reviewed.  Constitutional:      Appearance: He is obese. He is ill-appearing.  HENT:     Head: Normocephalic.     Mouth/Throat:     Mouth: Mucous membranes are moist.  Cardiovascular:     Rate and  Rhythm: Normal rate.     Pulses: Normal pulses.  Pulmonary:     Comments: Bipap in place Musculoskeletal:     Comments: Generalized weakness  Skin:    General: Skin is warm.  Neurological:     Mental Status: He is alert.     Comments: Oriented to self, intermittently confused             Palliative Assessment/Data: 30%    Total Time 50 minutes  Greater than 50%  of this time was spent counseling and coordinating care related to the above assessment and plan.  Thank you for allowing the Palliative Medicine  Team to assist in the care of this patient.  Lamont Ilsa Iha, FNP-BC Palliative Medicine Team Team Phone # [phone]

## 2021-10-27 NOTE — Progress Notes (Signed)
Date of Admission:  09/28/2021    ID: [name]. is a 66 y.o. male  Principal Problem:   Acute delirium Active Problems:   Essential hypertension, benign   RLS (restless legs syndrome)   Osteomyelitis (HCC)   Obesity, Class III, BMI 40-49.9 (morbid obesity) (HCC)   Acute on chronic diastolic CHF (congestive heart failure) (HCC)   AF (paroxysmal atrial fibrillation) ([place])   Uncontrolled type 2 diabetes mellitus with hypoglycemia, with long-term current use of insulin (HCC)   Malnutrition of moderate degree   Generalized weakness   Acute respiratory failure with hypoxia and hypercapnia (HCC)   OSA (obstructive sleep apnea)   Sepsis due to methicillin resistant Staphylococcus aureus (MRSA) (HCC)   Acute on chronic respiratory failure with hypoxia and hypercapnia (HCC)   Acute urinary retention   Hyperkalemia   Iron deficiency anemia   UTI (urinary tract infection)  44yrmale with h/o DM, HTN, CAD, S/p CABG, CHF, venous edema, lymphedema legs, s/p amputation of multiple toes of both feet, acute heel ulcer left with calcaneal osteomyelitis for which he underwent partial calcanectomy in april 2023 followed by 6 weeks of IV zosyn for kleb, proteus and bacteroides in culture- he completed IV on 09/17/21 and was placed on augmentin following that. [facility]/l lower extremity swelling and redness of left foot. Blood culture was positive for MRSA 1 of 4 .he was on triple antibiotics- wound culture was positive for MRSA as well He was intermittently confused and found to have co2 retention- had to get BIPAP- he had Left BKA on 10/03/21 As he could not get TEE the plan was to treat him with 4 weeks of IV antibiotic.( 11/01/21) HE was on daptomycin because of CKD- He got IV lasix for volume overload 6/6-6/17. But it had to be resumed because of persistent hypoxia and pulmonary edema Foley cath was placed on 6/11 for urinary retention and flomax was increased, foley  was removed on 6/16 and it looked he was voiding well I had signed off on 10/18/21 . Because of fever a week after removal of cath a urine culture was sent and he was started on IV ceftriaxone- citrobacter grew from culture and was resistant to ceftriaoxne and was changed to cefepime on 10/24/21. Foley cath had tto be replaced for retention . He was transferred to Step down ICU for repsiratory status CXR showed pulmonary edema Because he was on dapto and risk for eosinolphilc pneumonia due to dapto it was discontinued and patient was put on linezolid. Also underwent left pleurocentesis  on 6/28/23and it was a transudate I am asked to come back on his case     Subjective: Confused today On bIPAP  Medications:   amiodarone  200 mg Oral Daily   apixaban  5 mg Oral BID   vitamin C  500 mg Oral Daily   aspirin EC  81 mg Oral Daily   budesonide (PULMICORT) nebulizer solution  0.5 mg Nebulization BID   chlorhexidine  15 mL Mouth Rinse BID   Chlorhexidine Gluconate Cloth  6 each Topical Daily   cholecalciferol  1,000 Units Oral Daily   ezetimibe  10 mg Oral Daily   ferrous sulfate  325 mg Oral Q breakfast   finasteride  5 mg Oral Daily   insulin aspart  0-20 Units Subcutaneous TID WC   insulin aspart  8 Units Subcutaneous TID WC   insulin glargine-yfgn  25 Units Subcutaneous QHS  ipratropium-albuterol  3 mL Nebulization BID   linezolid  600 mg Oral Q12H   methylPREDNISolone (SOLU-MEDROL) injection  20 mg Intravenous Daily   midodrine  10 mg Oral TID WC   multivitamin with minerals  1 tablet Oral Daily   nystatin   Topical BID   mouth rinse  15 mL Mouth Rinse 4 times per day   primidone  250 mg Oral BID   Ensure Max Protein  11 oz Oral BID   risperiDONE  0.5 mg Oral BID   vitamin B-12  1,000 mcg Oral Daily    Objective: Vital signs in last 24 hours: Temp:  [98 F (36.7 C)-98.5 F (36.9 C)] 98.4 F (36.9 C) (06/30 0400) Pulse Rate:  [31-123] 123 (06/30 1000) Resp:  [12-26] 14  (06/30 1000) BP: (86-172)/(56-139) 95/64 (06/30 1000) SpO2:  [69 %-100 %] 100 % (06/30 1000) FiO2 (%):  [60 %-65 %] 60 % (06/30 0840) Weight:  [155.7 kg] 155.7 kg (06/30 0500)  LDA Foley- 10/24/21   PHYSICAL EXAM:  General:confused, agitated, on bipap Lungs: b/l air entry- decreased bases Crepts Heart: Tachycardia. Abdomen: Soft, non-tender,not distended. Bowel sounds normal. No masses Extremities: left bka site- dressing but nurse says it is well coapted and no erythema or discharge Skin: No rashes or lesions. Or bruising Lymph: Cervical, supraclavicular normal. Neurologic: moves all extremities  Lab Results Recent Labs    10/26/21 0546 10/27/21 0602  WBC 7.7  7.7 6.9  HGB 8.7*  8.6* 8.9*  HCT 30.5*  30.4* 31.1*  NA 141 139  K 4.2 4.2  CL 92* 92*  CO2 39* 36*  BUN 52* 38*  CREATININE 1.41* 0.95   Liver Panel Recent Labs    10/26/21 0546  ALBUMIN 2.2*   Microbiology: 10/21/21 UC citrobacter 6/1 BC- MRSA 1 of 4 10/01/21 left heel ulcer MRSA 10/02/21 BC NG Studies/Results: US THORACENTESIS ASP PLEURAL SPACE W/IMG GUIDE  Result Date: 10/26/2021 INDICATION: Patient with MRSA sepsis and confusion with right-sided pleural effusion request received for diagnostic and therapeutic thoracentesis. EXAM: ULTRASOUND GUIDED RIGHT THORACENTESIS MEDICATIONS: Local 1% lidocaine only. COMPLICATIONS: None immediate. PROCEDURE: An ultrasound guided thoracentesis was thoroughly discussed with the patient and questions answered. The benefits, risks, alternatives and complications were also discussed. The patient understands and wishes to proceed with the procedure. Written consent was obtained. Ultrasound was performed to localize and mark an adequate pocket of fluid in the right chest. The area was then prepped and draped in the normal sterile fashion. 1% Lidocaine was used for local anesthesia. Under ultrasound guidance a 19 gauge, 7-cm, Yueh catheter was introduced. Thoracentesis was  performed. The catheter was removed and a dressing applied. FINDINGS: A total of approximately 1.2 L of clear yellow fluid was removed. Samples were sent to the laboratory as requested by the clinical team. IMPRESSION: Successful ultrasound guided right thoracentesis yielding 1.2 L of pleural fluid. This exam was performed by Tsosie Billing PA-C, and was supervised and interpreted by Dr. Denna Haggard. Electronically Signed   By: Albin Felling M.D.   On: 10/26/2021 08:02   DG Chest Port 1 View  Result Date: 10/25/2021 CLINICAL DATA:  Post thoracentesis EXAM: PORTABLE CHEST 1 VIEW COMPARISON:  10/24/2021 FINDINGS: Interval decrease in right pleural effusion. Improved aeration in the right lung base. No pneumothorax. Cardiac enlargement with changes of CABG. Vascular congestion and mild edema compatible with heart failure. Progression of left lower lobe atelectasis and effusion. IMPRESSION: No pneumothorax post right thoracentesis. Improved aeration right lung base Progression  of left lower lobe airspace disease and left effusion Persistent pulmonary edema. Electronically Signed   By: Franchot Gallo M.D.   On: 10/25/2021 15:24    CXR reviewed by me personally Persistent pulmonary edema Assessment/Plan: MRSA bacteremia secondary to left heel ulcer with underlying osteomyelitis. Low bioburden 1 of 4 Repeat blood culture neg Patient was on   Daptomycin S/p BKA on 10/03/21 2 d echo no vegetation- Mitral valve is thickened,no vegetation- unable to do TEE because  of his tenuous resp status Plan was  to give daptomycin for  4 weeks until  11/01/21. Because of worsening resp status and concern that dapto can cause eosinophilic pneumonia it was discontinued and patient is on IV linezolid for 1 more week Can change to PO because of not having enough IV access   UTI due to urinary retention- he now has a foley- will do 3 days of cefepime  Acute hypoxic/hypercapnic  resp failure- combination of CHF, hypoventilation  syndrome,OSA , co2 retention  r/o amiodarone induced pneumonitis and dapto induced eosinophilic pneumonia Dapto dc on 6/27 CHF- b/l pulm edema and pleural effusion s/p rt thoracentesis     AKI   Encephalopathy.  Multifactorial--predominantly due to CO2 narcosis/ . On intermittent BIPAP. Fluctuating  Severe venous edema of both legs  DM- on insulin  Paroxysmal A-fib with atrial flutter.  On amiodarone metoprolol and Eliquis  Coronary artery disease status post CABG   Discussed the management with  his nurse   ID will follow him peripherally this weekend- call if needed

## 2021-10-27 NOTE — Progress Notes (Signed)
NAME:  Alec Sakai., MRN:  [id number], DOB:  February 28, 1956, LOS: 78 ADMISSION DATE:  09/28/2021, CONSULTATION DATE: 10/24/2021 REFERRING MD: Dr. Delana Meyer, CHIEF COMPLAINT: Hypoxia   History of Present Illness:  This is a 66 yo male who presented to [facility] ER on 06/1 via EMS from home with c/o left lower extremity erythema, warmth, and discomfort to the left lower leg.  The pt also had concerns regarding fluid overload due to increased swelling.  He reported he went to the heart failure clinic 2 days prior to ER presentation and was given iv lasix.  He previously has had a partial calcanectomy and has been followed by podiatry and the wound care clinic with wound vac in place.    ED Course  Upon arrival to the ER MR Left Foot revealed substantially increased osteomyelitis of the calcaneus; partial rupture of the plantar fascia; a large plantar heel ulcer extending to the bony margin of the calcaneus; and diffuse cellulitis along the left heel.  On call podiatrist Dr. Cleda Mccreedy consulted by ER provider.  Pt received ceftriaxone and flagyl.  CXR obtained and revealed pulmonary edema, therefore pt received iv lasix and placed on oxygen.  He was subsequently admitted to the telemetry unit for additional workup and treatment.  See detailed hospital course listed under significant events.    Pertinent  Medical History  Atrial Fibrillation  Asthma  COPD CAD Depression  Type II Diabetes Mellitus Gout  Anabolic Steroid Use HLD HTN Hypogonadism in Male Morbid Obesity MI PVD Perirectal Abscess  Pluerisy  OSA~CPAP qhs Varicella Chronic Diastolic CHF   Significant Hospital Events: Including procedures, antibiotic start and stop dates in addition to other pertinent events   06/1: Pt admitted to the [facility] unit with acute on chronic diastolic CHF and left heel osteomyelitis  06/2: Podiatry consulted for left foot osteomyelitis and felt left foot not salvageable and will require higher amputation.   Vascular surgery consulted  06/4: ID consulted for MRSA bacteremia likely source left heel wound  06/6: Pt underwent left BKA per vascular surgery  06/6 to 06/17: Pt with worsening acute on chronic hypoxic respiratory failure secondary to volume overload due to diastolic CHF.  CXR revealed new pleural effusion.  IV lasix resumed.  Requiring Bipap or [place]  06/21 to 06/27: mentation waxed and waned requiring Bipap during the day due to hypoxia  06/27: Pt requiring 100% HHFNC and transfer to the stepdown unit.  PCCM team consulted to assist with management  6/28: Pt tolerating [place] @ 50L/85%; Underwent Right-sided thoracentesis with removal of 1.2L of pleural fluid 6/29: Tolerating 45L/70% FiO2 with O2 sats 100%; remains on levophed gtt '@4'$  mcg/min to maintain map >65 6/30: altered mental status, but not from hypercapnia   Cultures:   6/1 BCx: 1/4 bottles MRSA 6/4 L Foot Wound: MRSA 6/5 BCx: NG 6/24 UCx: 60k CFU / mL Citrobacter freundii  6/28: Pleural fluid:   Antimicrobials:   Zosyn 6/1 x 1 Vancomycin 6/1 x 1, 6/3 >> 6/6 Metronidazole 6/2 >> 6/6 Ceftriaxone 6/2 >> 6/5, 6/25 >> 6/27 Daptomycin 6/7 >>6/27  Bactrim 6/27 x 1 Cefepime 6/27 >> Linezolid 6/27>>  Interim History / Subjective:  As outlined above under significant events   Objective   Blood pressure 95/64, pulse (!) 123, temperature 98.4 F (36.9 C), resp. rate 14, height '6\' 4"'$  (1.93 m), weight (!) 155.7 kg, SpO2 100 %.    FiO2 (%):  [60 %-65 %] 60 %   Intake/Output Summary (Last 24  hours) at 10/27/2021 1252 Last data filed at 10/26/2021 1749 Gross per 24 hour  Intake 223.93 ml  Output --  Net 223.93 ml    Filed Weights   10/23/21 0500 10/25/21 0500 10/27/21 0500  Weight: (!) 157.2 kg (!) 157.8 kg (!) 155.7 kg    Examination: General: Acute on chronically ill appearing male, NAD on NIV (off at moment) HENT: Supple, no JVD  Lungs: Diminished throughout, even, non labored  Cardiovascular: Sinus tach, rrr, no  r/g, 2+ radial/1+ distal pulses Abdomen: +BS x4, obese, soft, non tender  Extremities: Left BKA, chronic RLE vascular changes  Neuro: Alert disoriented to situation/time, follows commands, PERRLA GU: Indwelling foley catheter draining yellow urine   Resolved Hospital Problem list     Assessment & Plan:  Acute on chronic hypoxic respiratory failure secondary to acute diastolic CHF exacerbation, pneumonia, and right-sided pleural effusion s/p right sided thoracentesis w/ removal of 1.2L of pleural fluid 10/25/21 OSA Hx: Asthma and Obesity  - Supplemental O2 for dyspnea and/or hypoxia  - Bipap qhs and prn - Continue abx as outlined above  - Scheduled and prn bronchodilator therapy  - Nebulized steroids  - HIGH RISK FOR INTUBATION, but pH alkalotic actually at moment  Acute on chronic diastolic CHF Paroxysmal atrial fibrillation  Hypotension suspect secondary to cardiac medications  Hx: HLD, CAD, HTN, HLD, and MI Echo 10/05/21 revealed EF 55 to 60% - Continuous telemetry monitoring  - Discontinue iv lasix due to severe alkalosis and worsening encephalopathy if diuresis needed will give diamox  - Cardiology consulted appreciate input: continue amiodarone, apixaban, aspirin, ezetimibe, and finasteride - Hold outpatient diltiazem and metoprolol due to hypotension - Prn levophed gtt to maintain map >65  -check EKG for Qtc, last was long, need to f/u given the antipsychotics  Left lower extremity osteomyelitis s/p left BKA 06/6 Pneumonia  MRSA bacteremia secondary osteomyelitis  Citrobacter freundii UTI  - Trend WBC and monitor fever curve  - Trend PCT and lactic acid  - Abx as outlined above   Urinary retention - Strict I&O's - Continue flomax   Type II Diabetes Mellitus  - CBG's ac/hs - SSI, scheduled novolog 4 units 3 times a day with meals, and semglee   Acute encephalopathy multifactorial secondary to infectious process and metabolic derangements - Frequent reorientation   - Continue seroquel, risperdal, and prn haldol, holding pending Qtc - Try single dose of valium and monitor result  Best Practice (right click and "Reselect all SmartList Selections" daily)   Diet/type: Regular consistency (see orders) DVT prophylaxis: apixaban  GI prophylaxis: [name] Lines: [name] Foley:  Yes, and it is still needed Code Status:  full code Last date of multidisciplinary goals of care discussion [10/26/2021]  Labs   CBC: Recent Labs  Lab 10/24/21 0638 10/25/21 0431 10/26/21 0546 10/27/21 0602 10/27/21 1033  WBC 10.3 9.3 7.7  7.7 6.9 7.3  NEUTROABS  --   --  5.2  --  4.9  HGB 9.0* 8.6* 8.7*  8.6* 8.9* 8.8*  HCT 30.6* 29.8* 30.5*  30.4* 31.1* 30.5*  MCV 94.2 94.9 95.9  95.6 93.4 94.1  PLT 192 203 213  212 217 236     Basic Metabolic Panel: Recent Labs  Lab 10/23/21 0554 10/24/21 0638 10/25/21 0431 10/26/21 0546 10/27/21 0602  NA 141 143 140 141 139  K 4.1 4.3 4.1 4.2 4.2  CL 90* 93* 90* 92* 92*  CO2 41* 40* 38* 39* 36*  GLUCOSE 140* 136* 166* 235* 234*  BUN 39* 40* 50* 52* 38*  CREATININE 1.06 1.06 1.34* 1.41* 0.95  CALCIUM 8.5* 8.4* 8.4* 8.5* 8.3*  MG  --   --  2.5* 2.6* 2.4  PHOS  --   --  4.1 4.1 2.4*    GFR: Estimated Creatinine Clearance: 123.8 mL/min (by C-G formula based on SCr of 0.95 mg/dL). Recent Labs  Lab 10/24/21 1806 10/25/21 0431 10/26/21 0546 10/27/21 0602 10/27/21 1033  PROCALCITON 0.27  --   --   --   --   WBC  --  9.3 7.7  7.7 6.9 7.3  LATICACIDVEN 0.9  --   --   --   --      Liver Function Tests: Recent Labs  Lab 10/26/21 0546  ALBUMIN 2.2*   No results for input(s): "LIPASE", "AMYLASE" in the last 168 hours. Recent Labs  Lab 10/27/21 1033  AMMONIA <10    ABG    Component Value Date/Time   PHART 7.51 (H) 10/27/2021 1139   PCO2ART 50 (H) 10/27/2021 1139   PO2ART 67 (L) 10/27/2021 1139   HCO3 39.9 (H) 10/27/2021 1139   O2SAT 95.9 10/27/2021 1139     Coagulation Profile: No results for  input(s): "INR", "PROTIME" in the last 168 hours.  Cardiac Enzymes: Recent Labs  Lab 10/23/21 0554  CKTOTAL 19*     HbA1C: Hgb A1c MFr Bld  Date/Time Value Ref Range Status  08/05/2021 04:27 AM 10.2 (H) 4.8 - 5.6 % Final    Comment:    (NOTE) Pre diabetes:          5.7%-6.4%  Diabetes:              >6.4%  Glycemic control for   <7.0% adults with diabetes   05/09/2021 07:20 AM 9.2 (H) 4.8 - 5.6 % Final    Comment:    (NOTE) Pre diabetes:          5.7%-6.4%  Diabetes:              >6.4%  Glycemic control for   <7.0% adults with diabetes     CBG: Recent Labs  Lab 10/26/21 1153 10/26/21 1637 10/26/21 2126 10/27/21 0731 10/27/21 1121  GLUCAP 273* 322* 291* 243* 276*     Review of Systems: Positives in BOLD   Gen: Denies fever, chills, weight change, fatigue, night sweats HEENT: Denies blurred vision, double vision, hearing loss, tinnitus, sinus congestion, rhinorrhea, sore throat, neck stiffness, dysphagia PULM: Denies shortness of breath, cough, sputum production, hemoptysis, wheezing CV: Denies chest pain, edema, orthopnea, paroxysmal nocturnal dyspnea, palpitations GI: Denies abdominal pain, nausea, vomiting, diarrhea, hematochezia, melena, constipation, change in bowel habits GU: Denies dysuria, hematuria, polyuria, oliguria, urethral discharge Endocrine: Denies hot or cold intolerance, polyuria, polyphagia or appetite change Derm: Denies rash, dry skin, scaling or peeling skin change Heme: Denies easy bruising, bleeding, bleeding gums Neuro: Denies headache, numbness, weakness, slurred speech, loss of memory or consciousness  Past Medical History:  He,  has a past medical history of Arrhythmia, Asthma, CHF (congestive heart failure) ([place]), COPD (chronic obstructive pulmonary disease) ([place]), Coronary artery disease, Depression, Diabetes mellitus without complication ([place]), Gout, History anabolic steroid use, Hyperlipidemia, Hypertension, Hypogonadism in male,  Morbid obesity ([place]), Myocardial infarction [facility]), Peripheral vascular disease ([place]), Perirectal abscess, Pleurisy, Sleep apnea, and Varicella.   Surgical History:   Past Surgical History:  Procedure Laterality Date   ABDOMINAL AORTIC ANEURYSM REPAIR     ACHILLES TENDON SURGERY Left 01/10/2021   Procedure: ACHILLES LENGTHENING/KIDNER;  Surgeon: Caroline More, DPM;  Location: [facility] ORS;  Service: Podiatry;  Laterality: Left;   AMPUTATION Left 10/03/2021   Procedure: AMPUTATION BELOW KNEE;  Surgeon: Katha Cabal, MD;  Location: [facility] OR[facility];  Service: Vascular;  Laterality: Left;   AMPUTATION TOE Right 02/10/2016   Procedure: AMPUTATION TOE 3RD TOE;  Surgeon: Samara Deist, DPM;  Location: [facility];  Service: Podiatry;  Laterality: Right;   AMPUTATION TOE Left 02/24/2020   Procedure: AMPUTATION TOE;  Surgeon: Caroline More, DPM;  Location: [facility];  Service: Podiatry;  Laterality: Left;   APPLICATION OF WOUND VAC Left 02/29/2020   Procedure: APPLICATION OF WOUND VAC;  Surgeon: Caroline More, DPM;  Location: [facility];  Service: Podiatry;  Laterality: Left;   COLONOSCOPY WITH PROPOFOL [name] 11/18/2015   Procedure: COLONOSCOPY WITH PROPOFOL;  Surgeon: Manya Silvas, MD;  Location: [facility] ENDOSCOPY;  Service: Endoscopy;  Laterality: [name];   CORONARY ARTERY BYPASS GRAFT     CORONARY STENT INTERVENTION [name] 02/02/2020   Procedure: CORONARY STENT INTERVENTION;  Surgeon: Isaias Cowman, MD;  Location: [place] CV LAB;  Service: Cardiovascular;  Laterality: [name];   INCISION AND DRAINAGE Left 08/07/2021   Procedure: INCISION AND DRAINAGE-Partial Calcanectomy;  Surgeon: Caroline More, DPM;  Location: [facility];  Service: Podiatry;  Laterality: Left;   IRRIGATION AND DEBRIDEMENT FOOT Left 02/29/2020   Procedure: IRRIGATION AND DEBRIDEMENT FOOT;  Surgeon: Caroline More, DPM;  Location: [facility];  Service: Podiatry;  Laterality: Left;   IRRIGATION AND DEBRIDEMENT FOOT Left 02/24/2020   Procedure:  IRRIGATION AND DEBRIDEMENT FOOT;  Surgeon: Caroline More, DPM;  Location: [facility];  Service: Podiatry;  Laterality: Left;   KNEE ARTHROSCOPY     LEFT HEART CATH AND CORS/GRAFTS ANGIOGRAPHY [name] 02/02/2020   Procedure: LEFT HEART CATH AND CORS/GRAFTS ANGIOGRAPHY;  Surgeon: Teodoro Spray, MD;  Location: [place] CV LAB;  Service: Cardiovascular;  Laterality: [name];   LOWER EXTREMITY ANGIOGRAPHY Left 02/25/2020   Procedure: Lower Extremity Angiography;  Surgeon: Algernon Huxley, MD;  Location: [place] [facility] LAB;  Service: Cardiovascular;  Laterality: Left;   LOWER EXTREMITY ANGIOGRAPHY Left 01/04/2021   Procedure: LOWER EXTREMITY ANGIOGRAPHY;  Surgeon: Algernon Huxley, MD;  Location: [place] CV LAB;  Service: Cardiovascular;  Laterality: Left;   METATARSAL HEAD EXCISION Left 01/10/2021   Procedure: METATARSAL HEAD EXCISION - LEFT 5th;  Surgeon: Caroline More, DPM;  Location: [facility];  Service: Podiatry;  Laterality: Left;   PERIPHERAL VASCULAR CATHETERIZATION Right 01/24/2016   Procedure: Lower Extremity Angiography;  Surgeon: Katha Cabal, MD;  Location: [place] [facility];  Service: Cardiovascular;  Laterality: Right;   PERIPHERAL VASCULAR CATHETERIZATION Right 01/25/2016   Procedure: Lower Extremity Angiography;  Surgeon: Katha Cabal, MD;  Location: [place] [facility];  Service: Cardiovascular;  Laterality: Right;   TOE AMPUTATION     TONSILLECTOMY       Social History:   reports that he quit smoking about 6 years ago. His smoking use included cigarettes. He has a 22.50 pack-year smoking history. He has never used smokeless tobacco. He reports that he does not currently use alcohol after a past usage of about 3.0 standard drinks of alcohol per week. He reports that he does not use drugs.   Family History:  His family history includes Alcohol abuse in his father; Coronary artery disease in his father; Heart failure in his brother; Hypertension in his father.    Allergies Allergies  Allergen Reactions   Penicillins Other (See Comments)  Happened at 66 years old and pt. stated he passed out  He has tolerated amoxicillin/clavulanate and ampicillin/sulbactam   Statins     Other reaction(s): Muscle Pain Causes legs to ache per pt     Home Medications  Prior to Admission medications   Medication Sig Start Date End Date Taking? Authorizing Provider  amiodarone (PACERONE) 200 MG tablet Take 200 mg by mouth daily.   Yes [provider]  amoxicillin-clavulanate (AUGMENTIN) 875-125 MG tablet Take 1 tablet by mouth 2 (two) times daily. 09/18/21  Yes Tsosie Billing, MD  apixaban (ELIQUIS) 5 MG TABS tablet Take 1 tablet (5 mg total) by mouth 2 (two) times daily. 01/26/16  Yes Stegmayer, Janalyn Harder, PA-C  aspirin EC 81 MG EC tablet Take 1 tablet (81 mg total) by mouth daily. Swallow whole. 05/17/21  Yes Dessa Phi, DO  Cholecalciferol 25 MCG (1000 UT) tablet Take 1,000 Units by mouth daily.   Yes [provider]  Cyanocobalamin (VITAMIN B-12) 5000 MCG TBDP Take 10,000 mcg by mouth daily.   Yes [provider]  diltiazem (CARDIZEM CD) 300 MG 24 hr capsule Take 1 capsule (300 mg total) by mouth daily. 01/16/21  Yes Bonnielee Haff, MD  ezetimibe (ZETIA) 10 MG tablet Take 10 mg by mouth at bedtime.   Yes [provider]  ferrous sulfate 325 (65 FE) MG tablet Take 325 mg by mouth daily with breakfast.   Yes [provider]  gabapentin (NEURONTIN) 300 MG capsule Take 300 mg by mouth at bedtime. 12/02/20  Yes [provider]  HUMALOG 100 UNIT/ML injection Inject 15 Units into the skin in the morning and at bedtime. Sliding scale 08/18/21  Yes [provider]  insulin glargine (LANTUS) 100 UNIT/ML Solostar Pen Inject 25 Units into the skin in the morning and at bedtime. 08/11/21  Yes Fritzi Mandes, MD  isosorbide mononitrate (IMDUR) 60 MG 24 hr tablet Take 60 mg by mouth 2 (two) times daily. 04/21/21   Yes [provider]  losartan (COZAAR) 50 MG tablet Take 50 mg by mouth daily.   Yes [provider]  metFORMIN (GLUCOPHAGE) 1000 MG tablet Take 1,000 mg by mouth 2 (two) times daily.  12/02/17  Yes [provider]  metoprolol tartrate (LOPRESSOR) 50 MG tablet Take 100 mg by mouth 2 (two) times daily. 04/21/21  Yes [provider]  Multiple Vitamins-Minerals (MULTIVITAMIN WITH MINERALS) tablet Take 1 tablet by mouth daily.   Yes [provider]  nitroGLYCERIN (NITROSTAT) 0.4 MG SL tablet Place 0.4 mg under the tongue every 5 (five) minutes as needed for chest pain.   Yes [provider]  pramipexole (MIRAPEX) 1 MG tablet Take 2 mg by mouth at bedtime. 11/09/20  Yes [provider]  primidone (MYSOLINE) 250 MG tablet Take 250 mg by mouth 2 (two) times daily. 01/23/20  Yes [provider]  tamsulosin (FLOMAX) 0.4 MG CAPS capsule Take 0.4 mg by mouth daily.   Yes [provider]  torsemide (DEMADEX) 20 MG tablet Take 3 tablets (60 mg total) by mouth daily. 09/26/21  Yes Hackney, Otila Kluver A, FNP  traMADol (ULTRAM) 50 MG tablet Take 1 tablet (50 mg total) by mouth daily as needed for moderate pain. 08/11/21  Yes Fritzi Mandes, MD  vitamin C (ASCORBIC ACID) 500 MG tablet Take 500 mg by mouth daily.   Yes [provider]  zinc gluconate 50 MG tablet Take 50 mg by mouth daily.   Yes [provider]  Continuous  Blood Gluc Sensor (FREESTYLE LIBRE 2 SENSOR) MISC  09/08/21   [provider]     Critical care time: 36 minutes

## 2021-10-27 NOTE — Progress Notes (Signed)
[place] for Electrolyte Monitoring and Replacement   Recent Labs: Potassium (mmol/L)  Date Value  10/27/2021 4.2  07/12/2011 4.4   Magnesium (mg/dL)  Date Value  10/27/2021 2.4   Calcium (mg/dL)  Date Value  10/27/2021 8.3 (L)   Calcium, Total (mg/dL)  Date Value  07/12/2011 8.8   Albumin (g/dL)  Date Value  10/26/2021 2.2 (L)   Phosphorus (mg/dL)  Date Value  10/27/2021 2.4 (L)   Sodium (mmol/L)  Date Value  10/27/2021 139  07/12/2011 141   Assessment: 81 YOM admitted with wound infection. Found to have osteomyelitis of L heel. PMH significant for uncontrolled E3PI, chronic diastolic dysfunction CHF, HTN, CAD, COPD, afib, morbid obesity, OSA, s/p recent wound debridement and partial calcanectomy (05/16). Pharmacy is asked to follow and replace electrolytes while in the CCU  Goal of Therapy:  Potassium 4.0 - 5.1 mmol/L Magnesium 2.0 - 2.4 mg/dL All Other Electrolytes WNL  Plan:  Phos 2.4, K Phos Neutral 500 mg PO x 1 dose Recheck electrolytes in AM 7/1  Benita Gutter 10/27/2021 8:15 AM

## 2021-10-27 NOTE — Progress Notes (Signed)
Peripherally Inserted Central Catheter Placement  The IV Nurse has discussed with the patient and/or persons authorized to consent for the patient, the purpose of this procedure and the potential benefits and risks involved with this procedure.  The benefits include less needle sticks, lab draws from the catheter, and the patient may be discharged home with the catheter. Risks include, but not limited to, infection, bleeding, blood clot (thrombus formation), and puncture of an artery; nerve damage and irregular heartbeat and possibility to perform a PICC exchange if needed/ordered by physician.  Alternatives to this procedure were also discussed.  Bard Power PICC patient education guide, fact sheet on infection prevention and patient information card has been provided to patient /or left at bedside.    PICC Placement Documentation  PICC Double Lumen 10/27/21 Right Brachial 42 cm 0 cm (Active)  Indication for Insertion or Continuance of Line Vasoactive infusions 10/27/21 1621  Exposed Catheter (cm) 0 cm 10/27/21 1621  Site Assessment Clean, Dry, Intact 10/27/21 1621  Lumen #1 Status Flushed;Saline locked;Blood return noted 10/27/21 1621  Lumen #2 Status Flushed;Saline locked;Blood return noted 10/27/21 1621  Dressing Type Transparent;Securing device 10/27/21 1621  Dressing Status Antimicrobial disc in place;Clean, Dry, Intact 10/27/21 1621  Safety Lock Not Applicable 94/07/68 0881  Dressing Intervention New dressing;Other (Comment) 10/27/21 1621  Dressing Change Due 11/03/21 10/27/21 1621    Consent signed by patient's wife, Sandrea Matte, per Primary RN via telephone.   Enos Fling 10/27/2021, 4:22 PM

## 2021-10-27 NOTE — Progress Notes (Signed)
[facility] Cardiology  Patient ID: [name] MRN: [id number] DOB/AGE: 67-Jun-1957 67 y.o.   Admit date: 09/28/2021 Referring Physician Dr. Francine Graven Primary Physician Dr. Felipa Furnace Primary Cardiologist Dr. Donnelly Angelica Reason for Consultation acute on chronic HFpEF    HPI: [name]. is a [phone] with a PMH significant for CAD s/p CABG 2006 and PCI of RCA 01/2020, HFpEF (LVEF 50-55% 04/2021), A. fib / flutter on apixaban, amiodarone and digoxin, HTN, BPH, morbid obesity, poorly controlled type 2 diabetes,  PAD s/p left third toe amputation, chronic venous stasis, OSA on CPAP who presented to [facility] ED on 09/28/21 with increasing pain and swelling in his left leg, and imaging concerning for worsening osteomyelitis of his left foot. He has has onoging infections needing wound debridement in his left lower leg over the past several months; now s/p Left BKA 6/6. He has had a prolonged hospitalization and has been diuresed significantly with delirium, mental status waxing and waning. Cardiology is following for HFpEF and persistent atrial flutter. Worsening respiratory failure on 6/27 requiring transfer to stepdown.   Interval History:  - continued stepdown care, remains on HHFNC slightly decreased now at 45L/60% and on midodrine. Levophed weaned off yesterday evening  - walked by this room early this am, alert and conversational, knew me by name and eating breakfast unassisted. - remains in atrial flutter with some paroxysms of RVR up to 160s    Vitals:   10/27/21 0915 10/27/21 0930 10/27/21 0945 10/27/21 1000  BP:    95/64  Pulse: (!) 120 (!) 113 (!) 123 (!) 123  Resp: (!) '21 18 17 14  '$ Temp:      TempSrc:      SpO2: 100% 93% 100% 100%  Weight:      Height:         Intake/Output Summary (Last 24 hours) at 10/27/2021 1053 Last data filed at 10/26/2021 1749 Gross per 24 hour  Intake 223.93 ml  Output --  Net 223.93 ml    PHYSICAL EXAM General: Ill-appearing Caucasian  male,  in no acute distress. Sitting upright in ICU bed HEENT:  Normocephalic and atraumatic. Neck:   No JVD.  Lungs: Normal respiratory effort on HHFNC.  Clear to auscultation anteriorly  Heart: Tachy but regular. Normal S1 and S2 without gallops or murmurs.  Abdomen: Obese appearing.  Msk: Normal strength and tone for age. Extremities:  RLE with venous stasis changes and woody appearance to skin,  Left leg BKA with dry padded dressing.  Both legs are much softer and less edematous than on presentation Neuro: Alert and oriented to self, place, and situation Psych: answers questions appropriately    LABS: Basic Metabolic Panel: Recent Labs    10/26/21 0546 10/27/21 0602  NA 141 139  K 4.2 4.2  CL 92* 92*  CO2 39* 36*  GLUCOSE 235* 234*  BUN 52* 38*  CREATININE 1.41* 0.95  CALCIUM 8.5* 8.3*  MG 2.6* 2.4  PHOS 4.1 2.4*    Liver Function Tests: Recent Labs    10/26/21 0546  ALBUMIN 2.2*    No results for input(s): "LIPASE", "AMYLASE" in the last 72 hours. CBC: Recent Labs    10/26/21 0546 10/27/21 0602  WBC 7.7  7.7 6.9  NEUTROABS 5.2  --   HGB 8.7*  8.6* 8.9*  HCT 30.5*  30.4* 31.1*  MCV 95.9  95.6 93.4  PLT 213  212 217    Cardiac Enzymes: No results for input(s): "CKTOTAL", "CKMB", "CKMBINDEX", "  TROPONINI" in the last 72 hours.  BNP: Invalid input(s): "POCBNP" D-Dimer: No results for input(s): "DDIMER" in the last 72 hours. Hemoglobin A1C: No results for input(s): "HGBA1C" in the last 72 hours. Fasting Lipid Panel: No results for input(s): "CHOL", "HDL", "LDLCALC", "TRIG", "CHOLHDL", "LDLDIRECT" in the last 72 hours. Thyroid Function Tests: No results for input(s): "TSH", "T4TOTAL", "T3FREE", "THYROIDAB" in the last 72 hours.  Invalid input(s): "FREET3" Anemia Panel: No results for input(s): "VITAMINB12", "FOLATE", "FERRITIN", "TIBC", "IRON", "RETICCTPCT" in the last 72 hours.   US THORACENTESIS ASP PLEURAL SPACE W/IMG GUIDE  Result Date:  10/26/2021 INDICATION: Patient with MRSA sepsis and confusion with right-sided pleural effusion request received for diagnostic and therapeutic thoracentesis. EXAM: ULTRASOUND GUIDED RIGHT THORACENTESIS MEDICATIONS: Local 1% lidocaine only. COMPLICATIONS: None immediate. PROCEDURE: An ultrasound guided thoracentesis was thoroughly discussed with the patient and questions answered. The benefits, risks, alternatives and complications were also discussed. The patient understands and wishes to proceed with the procedure. Written consent was obtained. Ultrasound was performed to localize and mark an adequate pocket of fluid in the right chest. The area was then prepped and draped in the normal sterile fashion. 1% Lidocaine was used for local anesthesia. Under ultrasound guidance a 19 gauge, 7-cm, Yueh catheter was introduced. Thoracentesis was performed. The catheter was removed and a dressing applied. FINDINGS: A total of approximately 1.2 L of clear yellow fluid was removed. Samples were sent to the laboratory as requested by the clinical team. IMPRESSION: Successful ultrasound guided right thoracentesis yielding 1.2 L of pleural fluid. This exam was performed by Tsosie Billing PA-C, and was supervised and interpreted by Dr. Denna Haggard. Electronically Signed   By: Albin Felling M.D.   On: 10/26/2021 08:02   DG Chest Port 1 View  Result Date: 10/25/2021 CLINICAL DATA:  Post thoracentesis EXAM: PORTABLE CHEST 1 VIEW COMPARISON:  10/24/2021 FINDINGS: Interval decrease in right pleural effusion. Improved aeration in the right lung base. No pneumothorax. Cardiac enlargement with changes of CABG. Vascular congestion and mild edema compatible with heart failure. Progression of left lower lobe atelectasis and effusion. IMPRESSION: No pneumothorax post right thoracentesis. Improved aeration right lung base Progression of left lower lobe airspace disease and left effusion Persistent pulmonary edema. Electronically Signed   By:  Franchot Gallo M.D.   On: 10/25/2021 15:24     Echo LVEF 55-60% 10/05/2021  TELEMETRY: Atrial flutter 119 bpm  ASSESSMENT AND PLAN:  Principal Problem:   Acute delirium Active Problems:   Essential hypertension, benign   RLS (restless legs syndrome)   Osteomyelitis (HCC)   Obesity, Class III, BMI 40-49.9 (morbid obesity) (HCC)   Acute on chronic diastolic CHF (congestive heart failure) (HCC)   AF (paroxysmal atrial fibrillation) ([place])   Uncontrolled type 2 diabetes mellitus with hypoglycemia, with long-term current use of insulin (HCC)   Malnutrition of moderate degree   Generalized weakness   Acute respiratory failure with hypoxia and hypercapnia (HCC)   OSA (obstructive sleep apnea)   Sepsis due to methicillin resistant Staphylococcus aureus (MRSA) (HCC)   Acute on chronic respiratory failure with hypoxia and hypercapnia (HCC)   Acute urinary retention   Hyperkalemia   Iron deficiency anemia   UTI (urinary tract infection)    1. Acute on chronic diastolic congestive heart failure, HFpEF, LVEF 55-60% 10/05/21, good diuresis after IV furosemide but remains on supplemental O2 2.  Paroxysmal atrial fibrillation/atrial flutter, currently in atrial flutter heart rate 117 bpm, on amiodarone and metoprolol for rate control and Eliquis for  stroke prevention, cardizem '120mg'$  CD 3.  Coronary artery disease, status post CABG 2006, PCI RCA 01/2020, currently without chest pain 4.  Left foot osteomyelitis on IV linezolid, left BKA 6/6 with Dr. Delana Meyer 5.  Acute kidney injury, improving to Cr 1.03 and >60  6.  MRSA bacteremia, osteomyelitis of L foot likely source, on IV daptomycin, repeat echo ordered by ID, resulted with thickened aortic and mitral leaflets without obvious vegetation. Will need 4 weeks of Abx, per ID 7. UTI, culture growing Citrobactr - on cefepime 8.  Respiratory failure with hypercarbia, worsening on 6/27 on HHFNC requiring transfer to stepdown  9. Delirium, multifactorial  - mental status waxing and waning, 2/2 prolonged hospitalization and metabolic derangements   Recommendations   1.  Agree with current therapy, appreciate PCCM assistance 2.  Agree with holding diuretics per PCCM, s/p one dose of acetazolamide, now s/p R thoracentesis with 1.2L fluid removed. 3.  Eliquis '5mg'$  BID restarted 6/9 for stroke risk reduction  4.  Continue amiodarone '200mg'$  once daily for rhythm control. Consider restarting low dose metoprolol tartrate 12.5 BID as his BP allows (home dose '100mg'$  BID) and continue to hold cardizem CD 120 mg d/t hypotension - on Levophed 6/28 and titrated off the evening of 6/30. Remains on midodrine, wean as able.  5.  S/p Left BKA 6/6  This patient's plan of care was discussed and created with Dr. Saralyn Pilar and he is in agreement.    Tristan Schroeder, PA-C 10/27/2021 10:53 AM

## 2021-10-28 DIAGNOSIS — I5033 Acute on chronic diastolic (congestive) heart failure: Secondary | ICD-10-CM | POA: Diagnosis not present

## 2021-10-28 DIAGNOSIS — J9601 Acute respiratory failure with hypoxia: Secondary | ICD-10-CM | POA: Diagnosis not present

## 2021-10-28 DIAGNOSIS — J9621 Acute and chronic respiratory failure with hypoxia: Secondary | ICD-10-CM | POA: Diagnosis not present

## 2021-10-28 DIAGNOSIS — R410 Disorientation, unspecified: Secondary | ICD-10-CM | POA: Diagnosis not present

## 2021-10-28 LAB — CBC
HCT: 27.7 % — ABNORMAL LOW (ref 39.0–52.0)
Hemoglobin: 8.1 g/dL — ABNORMAL LOW (ref 13.0–17.0)
MCH: 27.4 pg (ref 26.0–34.0)
MCHC: 29.2 g/dL — ABNORMAL LOW (ref 30.0–36.0)
MCV: 93.6 fL (ref 80.0–100.0)
Platelets: 226 10*3/uL (ref 150–400)
RBC: 2.96 MIL/uL — ABNORMAL LOW (ref 4.22–5.81)
RDW: 16.5 % — ABNORMAL HIGH (ref 11.5–15.5)
WBC: 7 10*3/uL (ref 4.0–10.5)
nRBC: 0 % (ref 0.0–0.2)

## 2021-10-28 LAB — GLUCOSE, CAPILLARY
Glucose-Capillary: 172 mg/dL — ABNORMAL HIGH (ref 70–99)
Glucose-Capillary: 255 mg/dL — ABNORMAL HIGH (ref 70–99)
Glucose-Capillary: 197 mg/dL — ABNORMAL HIGH (ref 70–99)
Glucose-Capillary: 188 mg/dL — ABNORMAL HIGH (ref 70–99)

## 2021-10-28 LAB — BASIC METABOLIC PANEL
Anion gap: 5 (ref 5–15)
BUN: 28 mg/dL — ABNORMAL HIGH (ref 8–23)
CO2: 36 mmol/L — ABNORMAL HIGH (ref 22–32)
Calcium: 8.3 mg/dL — ABNORMAL LOW (ref 8.9–10.3)
Chloride: 100 mmol/L (ref 98–111)
Creatinine, Ser: 0.84 mg/dL (ref 0.61–1.24)
GFR, Estimated: >60 mL/min (ref >60)
Glucose, Bld: 192 mg/dL — ABNORMAL HIGH (ref 70–99)
Potassium: 4.3 mmol/L (ref 3.5–5.1)
Sodium: 141 mmol/L (ref 135–145)

## 2021-10-28 LAB — MAGNESIUM: Magnesium: 2.3 mg/dL (ref 1.7–2.4)

## 2021-10-28 LAB — PHOSPHORUS: Phosphorus: 3 mg/dL (ref 2.5–4.6)

## 2021-10-28 MED ORDER — MIDODRINE HCL 5 MG PO TABS
5.0000 mg | ORAL_TABLET | Freq: Three times a day (TID) | ORAL | Status: DC
  Administered 2021-10-28 – 2021-10-31 (×10): 5 mg via ORAL
  Filled 2021-10-28 (×11): qty 1

## 2021-10-28 MED ORDER — MIDAZOLAM HCL 2 MG/2ML IJ SOLN
1.0000 mg | Freq: Once | INTRAMUSCULAR | Status: AC
  Administered 2021-10-28: 1 mg via INTRAVENOUS
  Filled 2021-10-28: qty 2

## 2021-10-28 MED ORDER — INSULIN GLARGINE-YFGN 100 UNIT/ML ~~LOC~~ SOLN
5.0000 [IU] | Freq: Every day | SUBCUTANEOUS | Status: DC
Start: 2021-10-29 — End: 2021-11-13
  Administered 2021-10-29 – 2021-11-13 (×16): 5 [IU] via SUBCUTANEOUS
  Filled 2021-10-28 (×16): qty 0.05

## 2021-10-28 NOTE — Progress Notes (Signed)
Updated pts granddaughter along with another family member at bedside regarding pts condition and current plan of care.  Donell Beers, [place] Pager [phone] (please enter 7 digits) PCCM Consult Pager [phone] (please enter 7 digits)

## 2021-10-28 NOTE — Progress Notes (Signed)
[place] for Electrolyte Monitoring and Replacement   Recent Labs: Potassium (mmol/L)  Date Value  10/28/2021 4.3  07/12/2011 4.4   Magnesium (mg/dL)  Date Value  10/28/2021 2.3   Calcium (mg/dL)  Date Value  10/28/2021 8.3 (L)   Calcium, Total (mg/dL)  Date Value  07/12/2011 8.8   Albumin (g/dL)  Date Value  10/26/2021 2.2 (L)   Phosphorus (mg/dL)  Date Value  10/28/2021 3.0   Sodium (mmol/L)  Date Value  10/28/2021 141  07/12/2011 141   Assessment: 43 YOM admitted with wound infection. Found to have osteomyelitis of L heel. PMH significant for uncontrolled B3XO, chronic diastolic dysfunction CHF, HTN, CAD, COPD, afib, morbid obesity, OSA, s/p recent wound debridement and partial calcanectomy (05/16). Pharmacy is asked to follow and replace electrolytes while in the CCU  Goal of Therapy:  Potassium 4.0 - 5.1 mmol/L Magnesium 2.0 - 2.4 mg/dL All Other Electrolytes WNL  Plan:  No electrolyte replacement warranted for today Recheck electrolytes in AM 7/2  Dallie Piles 10/28/2021 6:37 AM

## 2021-10-28 NOTE — Progress Notes (Signed)
[facility] Cardiology  SUBJECTIVE: Patient laying flat in bed, reports feeling better, denies chest pain   Vitals:   10/28/21 0700 10/28/21 0800 10/28/21 0900 10/28/21 0906  BP:      Pulse: (!) 116 (!) 117 (!) 118   Resp: '20 14 13   '$ Temp:      TempSrc:      SpO2: (!) 82% 98% 100% 100%  Weight:      Height:         Intake/Output Summary (Last 24 hours) at 10/28/2021 0814 Last data filed at 10/28/2021 0859 Gross per 24 hour  Intake 20 ml  Output 3900 ml  Net -3880 ml      PHYSICAL EXAM  General: Well developed, well nourished, in no acute distress HEENT:  Normocephalic and atramatic Neck:  No JVD.  Lungs: Clear bilaterally to auscultation and percussion. Heart: HRRR . Normal S1 and S2 without gallops or murmurs.  Abdomen: Bowel sounds are positive, abdomen soft and non-tender  Msk:  Back normal, normal gait. Normal strength and tone for age. Extremities: No clubbing, cyanosis or edema.   Neuro: Alert and oriented X 3. Psych:  Good affect, responds appropriately   LABS: Basic Metabolic Panel: Recent Labs    10/27/21 0602 10/28/21 0543  NA 139 141  K 4.2 4.3  CL 92* 100  CO2 36* 36*  GLUCOSE 234* 192*  BUN 38* 28*  CREATININE 0.95 0.84  CALCIUM 8.3* 8.3*  MG 2.4 2.3  PHOS 2.4* 3.0   Liver Function Tests: Recent Labs    10/26/21 0546  ALBUMIN 2.2*   No results for input(s): "LIPASE", "AMYLASE" in the last 72 hours. CBC: Recent Labs    10/26/21 0546 10/27/21 0602 10/27/21 1033 10/28/21 0543  WBC 7.7  7.7   < > 7.3 7.0  NEUTROABS 5.2  --  4.9  --   HGB 8.7*  8.6*   < > 8.8* 8.1*  HCT 30.5*  30.4*   < > 30.5* 27.7*  MCV 95.9  95.6   < > 94.1 93.6  PLT 213  212   < > 236 226   < > = values in this interval not displayed.   Cardiac Enzymes: No results for input(s): "CKTOTAL", "CKMB", "CKMBINDEX", "TROPONINI" in the last 72 hours. BNP: Invalid input(s): "POCBNP" D-Dimer: No results for input(s): "DDIMER" in the last 72 hours. Hemoglobin A1C: No  results for input(s): "HGBA1C" in the last 72 hours. Fasting Lipid Panel: No results for input(s): "CHOL", "HDL", "LDLCALC", "TRIG", "CHOLHDL", "LDLDIRECT" in the last 72 hours. Thyroid Function Tests: Recent Labs    10/27/21 1033  TSH 4.833*   Anemia Panel: Recent Labs    10/27/21 1033  VITAMINB12 2,327*    [place] EKG SITE RITE  Result Date: 10/27/2021 If Site Rite image not attached, placement could not be confirmed due to current cardiac rhythm.    Echo LVEF 55-60% 10/05/2021  TELEMETRY: Atrial flutter 119 bpm:  ASSESSMENT AND PLAN:  Principal Problem:   Acute delirium Active Problems:   Essential hypertension, benign   RLS (restless legs syndrome)   Osteomyelitis (HCC)   Obesity, Class III, BMI 40-49.9 (morbid obesity) (HCC)   Acute on chronic diastolic CHF (congestive heart failure) (HCC)   AF (paroxysmal atrial fibrillation) ([place])   Uncontrolled type 2 diabetes mellitus with hypoglycemia, with long-term current use of insulin (HCC)   Malnutrition of moderate degree   Generalized weakness   Acute respiratory failure with hypoxia and hypercapnia (HCC)   OSA (  obstructive sleep apnea)   Sepsis due to methicillin resistant Staphylococcus aureus (MRSA) (HCC)   Acute on chronic respiratory failure with hypoxia and hypercapnia (HCC)   Acute urinary retention   Hyperkalemia   Iron deficiency anemia   UTI (urinary tract infection)    1. Acute on chronic diastolic congestive heart failure, HFpEF, LVEF 55-60% 10/05/21, good diuresis after IV furosemide but remains on supplemental O2 2.  Paroxysmal atrial fibrillation/atrial flutter, currently in atrial flutter heart rate 119 bpm, on amiodarone for rate control and Eliquis for stroke prevention 3.  Coronary artery disease, status post CABG 2006, PCI RCA 01/2020, currently without chest pain 4.  Left foot osteomyelitis on IV linezolid, left BKA 6/6 with Dr. Delana Meyer 5.  Acute kidney injury, improving to Cr 0.84 and >60  6.  MRSA  bacteremia, osteomyelitis of L foot likely source, treated with IV daptomycin, repeat echo ordered by ID, resulted with thickened aortic and mitral leaflets without obvious vegetation.     Recommendations   1.  Agree with current therapy 2.  Continue Eliquis for stroke prevention 3.  Continue amiodarone 200 mg daily for rate control 4.  Furosemide IV as needed   Isaias Cowman, MD, PhD, [facility] 10/28/2021 9:27 AM

## 2021-10-28 NOTE — Progress Notes (Signed)
RN instructed to contact respiratory when patient is ready to go to sleep so RRT can place patient on BiPAP.

## 2021-10-28 NOTE — Progress Notes (Signed)
NAME:  Alec Shibata., MRN:  [id number], DOB:  [birth year], LOS: 3 ADMISSION DATE:  09/28/2021, CONSULTATION DATE: 10/24/2021 REFERRING MD: Dr. Delana Meyer, CHIEF COMPLAINT: Hypoxia   History of Present Illness:  This is a 66 yo male who presented to [facility] ER on 06/1 via EMS from home with c/o left lower extremity erythema, warmth, and discomfort to the left lower leg.  The pt also had concerns regarding fluid overload due to increased swelling.  He reported he went to the heart failure clinic 2 days prior to ER presentation and was given iv lasix.  He previously has had a partial calcanectomy and has been followed by podiatry and the wound care clinic with wound vac in place.    ED Course  Upon arrival to the ER MR Left Foot revealed substantially increased osteomyelitis of the calcaneus; partial rupture of the plantar fascia; a large plantar heel ulcer extending to the bony margin of the calcaneus; and diffuse cellulitis along the left heel.  On call podiatrist Dr. Cleda Mccreedy consulted by ER provider.  Pt received ceftriaxone and flagyl.  CXR obtained and revealed pulmonary edema, therefore pt received iv lasix and placed on oxygen.  He was subsequently admitted to the telemetry unit for additional workup and treatment.  See detailed hospital course listed under significant events.    Pertinent  Medical History  Atrial Fibrillation  Asthma  COPD CAD Depression  Type II Diabetes Mellitus Gout  Anabolic Steroid Use HLD HTN Hypogonadism in Male Morbid Obesity MI PVD Perirectal Abscess  Pluerisy  OSA~CPAP qhs Varicella Chronic Diastolic CHF   Significant Hospital Events: Including procedures, antibiotic start and stop dates in addition to other pertinent events   06/1: Pt admitted to the [facility] unit with acute on chronic diastolic CHF and left heel osteomyelitis  06/2: Podiatry consulted for left foot osteomyelitis and felt left foot not salvageable and will require higher amputation.   Vascular surgery consulted  06/4: ID consulted for MRSA bacteremia likely source left heel wound  06/6: Pt underwent left BKA per vascular surgery  06/6 to 06/17: Pt with worsening acute on chronic hypoxic respiratory failure secondary to volume overload due to diastolic CHF.  CXR revealed new pleural effusion.  IV lasix resumed.  Requiring Bipap or [place]  06/21 to 06/27: mentation waxed and waned requiring Bipap during the day due to hypoxia  06/27: Pt requiring 100% HHFNC and transfer to the stepdown unit.  PCCM team consulted to assist with management  6/28: Pt tolerating [place] @ 50L/85%; Underwent Right-sided thoracentesis with removal of 1.2L of pleural fluid 6/29: Tolerating 45L/70% FiO2 with O2 sats 100%; remains on levophed gtt '@4'$  mcg/min to maintain map >65 6/30: altered mental status, but not from hypercapnia  7/1: Pt with intermittent agitation with worsening delirium that started 6/20.  Received prn seroquel and haldol overnight due to sleep deprivation.  This am pt calm with improvement in mentation   Cultures:   6/1 BCx: 1/4 bottles MRSA 6/4 L Foot Wound: MRSA 6/5 BCx: NG 6/24 UCx: 60k CFU / mL Citrobacter freundii  6/28: Pleural fluid: negative   Antimicrobials:   Zosyn 6/1 x 1 Vancomycin 6/1 x 1, 6/3 >> 6/6 Metronidazole 6/2 >> 6/6 Ceftriaxone 6/2 >> 6/5, 6/25 >> 6/27 Daptomycin 6/7 >>6/27  Bactrim 6/27 x 1 Cefepime 6/27 >>6/29 Linezolid 6/27>>  Interim History / Subjective:  As outlined above under significant events   Objective   Blood pressure 128/89, pulse (!) 120, temperature 99 F (37.2  C), temperature source Axillary, resp. rate 16, height '6\' 4"'$  (1.93 m), weight (!) 154.4 kg, SpO2 100 %.    FiO2 (%):  [50 %-60 %] 50 %   Intake/Output Summary (Last 24 hours) at 10/28/2021 0735 Last data filed at 10/28/2021 0100 Gross per 24 hour  Intake 20 ml  Output 2800 ml  Net -2780 ml   Filed Weights   10/25/21 0500 10/27/21 0500 10/28/21 0500  Weight: (!) 157.8 kg  (!) 155.7 kg (!) 154.4 kg    Examination: General: Acute on chronically ill appearing male, NAD on NIV  HENT: Supple, no JVD  Lungs: Diminished throughout, even, non labored  Cardiovascular: Sinus tach, rrr, no r/g, 2+ radial/1+ distal pulses Abdomen: +BS x4, obese, soft, non tender  Extremities: Left BKA, chronic RLE vascular changes  Neuro: Alert disoriented to situation/time, follows commands, PERRLA GU: Indwelling foley catheter draining yellow urine   Resolved Hospital Problem list     Assessment & Plan:  Acute on chronic hypoxic respiratory failure secondary to acute diastolic CHF exacerbation, pneumonia, and right-sided pleural effusion s/p right sided thoracentesis w/ removal of 1.2L of pleural fluid 10/25/21 OSA Hx: Asthma and Obesity  - Supplemental O2 for dyspnea and/or hypoxia  - Bipap qhs and prn - Continue abx as outlined above  - Scheduled and prn bronchodilator therapy - Will discontinued iv steroids due to worsening delirium   - Nebulized steroids  - HIGH RISK FOR INTUBATION  Acute on chronic diastolic CHF Paroxysmal atrial fibrillation  Hypotension suspect secondary to cardiac medications  Hx: HLD, CAD, HTN, HLD, and MI Echo 10/05/21 revealed EF 55 to 60% - Continuous telemetry monitoring  - Discontinue iv lasix due to severe alkalosis and worsening encephalopathy if diuresis needed will give diamox  - Cardiology consulted appreciate input: continue amiodarone, apixaban, aspirin, ezetimibe, and finasteride - Hold outpatient diltiazem and metoprolol due to hypotension - Continue scheduled midodrine and prn levophed gtt to maintain map >65   Left lower extremity osteomyelitis s/p left BKA 06/6 Pneumonia  MRSA bacteremia secondary osteomyelitis  Citrobacter freundii UTI  - Trend WBC and monitor fever curve  - Trend PCT and lactic acid  - Abx as outlined above   Urinary retention - Strict I&O's - Continue flomax   Type II Diabetes Mellitus  - CBG's  ac/hs - SSI, scheduled novolog 10 units 3 times a day with meals, and semglee   Acute encephalopathy multifactorial secondary to infectious process and metabolic derangements - Frequent reorientation  - Continue prn seroquel and haldol, follow Qtc  Best Practice (right click and "Reselect all SmartList Selections" daily)   Diet/type: Regular consistency (see orders) DVT prophylaxis: apixaban  GI prophylaxis: [name] Lines: [name] Foley:  Yes, and it is still needed Code Status:  full code Last date of multidisciplinary goals of care discussion [10/26/2021]  Labs   CBC: Recent Labs  Lab 10/25/21 0431 10/26/21 0546 10/27/21 0602 10/27/21 1033 10/28/21 0543  WBC 9.3 7.7  7.7 6.9 7.3 7.0  NEUTROABS  --  5.2  --  4.9  --   HGB 8.6* 8.7*  8.6* 8.9* 8.8* 8.1*  HCT 29.8* 30.5*  30.4* 31.1* 30.5* 27.7*  MCV 94.9 95.9  95.6 93.4 94.1 93.6  PLT 203 213  212 217 236 102    Basic Metabolic Panel: Recent Labs  Lab 10/24/21 0638 10/25/21 0431 10/26/21 0546 10/27/21 0602 10/28/21 0543  NA 143 140 141 139 141  K 4.3 4.1 4.2 4.2 4.3  CL 93* 90*  92* 92* 100  CO2 40* 38* 39* 36* 36*  GLUCOSE 136* 166* 235* 234* 192*  BUN 40* 50* 52* 38* 28*  CREATININE 1.06 1.34* 1.41* 0.95 0.84  CALCIUM 8.4* 8.4* 8.5* 8.3* 8.3*  MG  --  2.5* 2.6* 2.4 2.3  PHOS  --  4.1 4.1 2.4* 3.0   GFR: Estimated Creatinine Clearance: 139.2 mL/min (by C-G formula based on SCr of 0.84 mg/dL). Recent Labs  Lab 10/24/21 1806 10/25/21 0431 10/26/21 0546 10/27/21 0602 10/27/21 1033 10/28/21 0543  PROCALCITON 0.27  --   --   --   --   --   WBC  --    < > 7.7  7.7 6.9 7.3 7.0  LATICACIDVEN 0.9  --   --   --   --   --    < > = values in this interval not displayed.    Liver Function Tests: Recent Labs  Lab 10/26/21 0546  ALBUMIN 2.2*   No results for input(s): "LIPASE", "AMYLASE" in the last 168 hours. Recent Labs  Lab 10/27/21 1033  AMMONIA <10    ABG    Component Value Date/Time   PHART  7.51 (H) 10/27/2021 1139   PCO2ART 50 (H) 10/27/2021 1139   PO2ART 67 (L) 10/27/2021 1139   HCO3 39.9 (H) 10/27/2021 1139   O2SAT 95.9 10/27/2021 1139     Coagulation Profile: No results for input(s): "INR", "PROTIME" in the last 168 hours.  Cardiac Enzymes: Recent Labs  Lab 10/23/21 0554  CKTOTAL 19*    HbA1C: Hgb A1c MFr Bld  Date/Time Value Ref Range Status  08/05/2021 04:27 AM 10.2 (H) 4.8 - 5.6 % Final    Comment:    (NOTE) Pre diabetes:          5.7%-6.4%  Diabetes:              >6.4%  Glycemic control for   <7.0% adults with diabetes   05/09/2021 07:20 AM 9.2 (H) 4.8 - 5.6 % Final    Comment:    (NOTE) Pre diabetes:          5.7%-6.4%  Diabetes:              >6.4%  Glycemic control for   <7.0% adults with diabetes     CBG: Recent Labs  Lab 10/26/21 2126 10/27/21 0731 10/27/21 1121 10/27/21 1634 10/27/21 2131  GLUCAP 291* 243* 276* 150* 171*    Review of Systems: Positives in BOLD   Gen: Denies fever, chills, weight change, fatigue, night sweats HEENT: Denies blurred vision, double vision, hearing loss, tinnitus, sinus congestion, rhinorrhea, sore throat, neck stiffness, dysphagia PULM: Denies shortness of breath, cough, sputum production, hemoptysis, wheezing CV: Denies chest pain, edema, orthopnea, paroxysmal nocturnal dyspnea, palpitations GI: Denies abdominal pain, nausea, vomiting, diarrhea, hematochezia, melena, constipation, change in bowel habits GU: Denies dysuria, hematuria, polyuria, oliguria, urethral discharge Endocrine: Denies hot or cold intolerance, polyuria, polyphagia or appetite change Derm: Denies rash, dry skin, scaling or peeling skin change Heme: Denies easy bruising, bleeding, bleeding gums Neuro: Denies headache, numbness, weakness, slurred speech, loss of memory or consciousness  Past Medical History:  He,  has a past medical history of Arrhythmia, Asthma, CHF (congestive heart failure) ([place]), COPD (chronic obstructive  pulmonary disease) ([place]), Coronary artery disease, Depression, Diabetes mellitus without complication ([place]), Gout, History anabolic steroid use, Hyperlipidemia, Hypertension, Hypogonadism in male, Morbid obesity ([place]), Myocardial infarction [facility]), Peripheral vascular disease ([place]), Perirectal abscess, Pleurisy, Sleep apnea, and Varicella.  Surgical History:   Past Surgical History:  Procedure Laterality Date   ABDOMINAL AORTIC ANEURYSM REPAIR     ACHILLES TENDON SURGERY Left 01/10/2021   Procedure: ACHILLES LENGTHENING/KIDNER;  Surgeon: Caroline More, DPM;  Location: [facility] OR[facility];  Service: Podiatry;  Laterality: Left;   AMPUTATION Left 10/03/2021   Procedure: AMPUTATION BELOW KNEE;  Surgeon: Katha Cabal, MD;  Location: [facility];  Service: Vascular;  Laterality: Left;   AMPUTATION TOE Right 02/10/2016   Procedure: AMPUTATION TOE 3RD TOE;  Surgeon: Samara Deist, DPM;  Location: [facility];  Service: Podiatry;  Laterality: Right;   AMPUTATION TOE Left 02/24/2020   Procedure: AMPUTATION TOE;  Surgeon: Caroline More, DPM;  Location: [facility];  Service: Podiatry;  Laterality: Left;   APPLICATION OF WOUND VAC Left 02/29/2020   Procedure: APPLICATION OF WOUND VAC;  Surgeon: Caroline More, DPM;  Location: [facility];  Service: Podiatry;  Laterality: Left;   COLONOSCOPY WITH PROPOFOL [name] 11/18/2015   Procedure: COLONOSCOPY WITH PROPOFOL;  Surgeon: Manya Silvas, MD;  Location: [facility];  Service: Endoscopy;  Laterality: [name];   CORONARY ARTERY BYPASS GRAFT     CORONARY STENT INTERVENTION [name] 02/02/2020   Procedure: CORONARY STENT INTERVENTION;  Surgeon: Isaias Cowman, MD;  Location: [place] [facility];  Service: Cardiovascular;  Laterality: [name];   INCISION AND DRAINAGE Left 08/07/2021   Procedure: INCISION AND DRAINAGE-Partial Calcanectomy;  Surgeon: Caroline More, DPM;  Location: [facility];  Service: Podiatry;  Laterality: Left;   IRRIGATION AND DEBRIDEMENT FOOT Left 02/29/2020   Procedure:  IRRIGATION AND DEBRIDEMENT FOOT;  Surgeon: Caroline More, DPM;  Location: [facility];  Service: Podiatry;  Laterality: Left;   IRRIGATION AND DEBRIDEMENT FOOT Left 02/24/2020   Procedure: IRRIGATION AND DEBRIDEMENT FOOT;  Surgeon: Caroline More, DPM;  Location: [facility];  Service: Podiatry;  Laterality: Left;   KNEE ARTHROSCOPY     LEFT HEART CATH AND CORS/GRAFTS ANGIOGRAPHY [name] 02/02/2020   Procedure: LEFT HEART CATH AND CORS/GRAFTS ANGIOGRAPHY;  Surgeon: Teodoro Spray, MD;  Location: [place] [facility];  Service: Cardiovascular;  Laterality: [name];   LOWER EXTREMITY ANGIOGRAPHY Left 02/25/2020   Procedure: Lower Extremity Angiography;  Surgeon: Algernon Huxley, MD;  Location: [place] [facility];  Service: Cardiovascular;  Laterality: Left;   LOWER EXTREMITY ANGIOGRAPHY Left 01/04/2021   Procedure: LOWER EXTREMITY ANGIOGRAPHY;  Surgeon: Algernon Huxley, MD;  Location: [place] [facility];  Service: Cardiovascular;  Laterality: Left;   METATARSAL HEAD EXCISION Left 01/10/2021   Procedure: METATARSAL HEAD EXCISION - LEFT 5th;  Surgeon: Caroline More, DPM;  Location: [facility];  Service: Podiatry;  Laterality: Left;   PERIPHERAL VASCULAR CATHETERIZATION Right 01/24/2016   Procedure: Lower Extremity Angiography;  Surgeon: Katha Cabal, MD;  Location: [place] [facility];  Service: Cardiovascular;  Laterality: Right;   PERIPHERAL VASCULAR CATHETERIZATION Right 01/25/2016   Procedure: Lower Extremity Angiography;  Surgeon: Katha Cabal, MD;  Location: [place] [facility];  Service: Cardiovascular;  Laterality: Right;   TOE AMPUTATION     TONSILLECTOMY       Social History:   reports that he quit smoking about 6 years ago. His smoking use included cigarettes. He has a 22.50 pack-year smoking history. He has never used smokeless tobacco. He reports that he does not currently use alcohol after a past usage of about 3.0 standard drinks of alcohol per week. He reports that he does not use drugs.    Family History:  His family history includes Alcohol abuse  in his father; Coronary artery disease in his father; Heart failure in his brother; Hypertension in his father.   Allergies Allergies  Allergen Reactions   Penicillins Other (See Comments)    Happened at 66 years old and pt. stated he passed out  He has tolerated amoxicillin/clavulanate and ampicillin/sulbactam   Statins     Other reaction(s): Muscle Pain Causes legs to ache per pt     Home Medications  Prior to Admission medications   Medication Sig Start Date End Date Taking? Authorizing Provider  amiodarone (PACERONE) 200 MG tablet Take 200 mg by mouth daily.   Yes [provider]  amoxicillin-clavulanate (AUGMENTIN) 875-125 MG tablet Take 1 tablet by mouth 2 (two) times daily. 09/18/21  Yes Tsosie Billing, MD  apixaban (ELIQUIS) 5 MG TABS tablet Take 1 tablet (5 mg total) by mouth 2 (two) times daily. 01/26/16  Yes Stegmayer, Janalyn Harder, PA-C  aspirin EC 81 MG EC tablet Take 1 tablet (81 mg total) by mouth daily. Swallow whole. 05/17/21  Yes Dessa Phi, DO  Cholecalciferol 25 MCG (1000 UT) tablet Take 1,000 Units by mouth daily.   Yes [provider]  Cyanocobalamin (VITAMIN B-12) 5000 MCG TBDP Take 10,000 mcg by mouth daily.   Yes [provider]  diltiazem (CARDIZEM CD) 300 MG 24 hr capsule Take 1 capsule (300 mg total) by mouth daily. 01/16/21  Yes Bonnielee Haff, MD  ezetimibe (ZETIA) 10 MG tablet Take 10 mg by mouth at bedtime.   Yes [provider]  ferrous sulfate 325 (65 FE) MG tablet Take 325 mg by mouth daily with breakfast.   Yes [provider]  gabapentin (NEURONTIN) 300 MG capsule Take 300 mg by mouth at bedtime. 12/02/20  Yes [provider]  HUMALOG 100 UNIT/ML injection Inject 15 Units into the skin in the morning and at bedtime. Sliding scale 08/18/21  Yes [provider]  insulin glargine (LANTUS) 100 UNIT/ML Solostar Pen Inject 25 Units  into the skin in the morning and at bedtime. 08/11/21  Yes Fritzi Mandes, MD  isosorbide mononitrate (IMDUR) 60 MG 24 hr tablet Take 60 mg by mouth 2 (two) times daily. 04/21/21  Yes [provider]  losartan (COZAAR) 50 MG tablet Take 50 mg by mouth daily.   Yes [provider]  metFORMIN (GLUCOPHAGE) 1000 MG tablet Take 1,000 mg by mouth 2 (two) times daily.  12/02/17  Yes [provider]  metoprolol tartrate (LOPRESSOR) 50 MG tablet Take 100 mg by mouth 2 (two) times daily. 04/21/21  Yes [provider]  Multiple Vitamins-Minerals (MULTIVITAMIN WITH MINERALS) tablet Take 1 tablet by mouth daily.   Yes [provider]  nitroGLYCERIN (NITROSTAT) 0.4 MG SL tablet Place 0.4 mg under the tongue every 5 (five) minutes as needed for chest pain.   Yes [provider]  pramipexole (MIRAPEX) 1 MG tablet Take 2 mg by mouth at bedtime. 11/09/20  Yes [provider]  primidone (MYSOLINE) 250 MG tablet Take 250 mg by mouth 2 (two) times daily. 01/23/20  Yes [provider]  tamsulosin (FLOMAX) 0.4 MG CAPS capsule Take 0.4 mg by mouth daily.   Yes [provider]  torsemide (DEMADEX) 20 MG tablet Take 3 tablets (60 mg total) by mouth daily. 09/26/21  Yes Hackney, Otila Kluver A, FNP  traMADol (ULTRAM) 50 MG tablet Take 1 tablet (50 mg total) by mouth daily as needed for moderate pain. 08/11/21  Yes Fritzi Mandes, MD  vitamin C (ASCORBIC ACID) 500  MG tablet Take 500 mg by mouth daily.   Yes [provider]  zinc gluconate 50 MG tablet Take 50 mg by mouth daily.   Yes [provider]  Continuous Blood Gluc Sensor (FREESTYLE LIBRE 2 SENSOR) MISC  09/08/21   [provider]     Critical care time: 63 minutes     Donell Beers, [place] Pager [phone] (please enter 7 digits) PCCM Consult Pager [phone] (please enter 7 digits)

## 2021-10-29 DIAGNOSIS — I5033 Acute on chronic diastolic (congestive) heart failure: Secondary | ICD-10-CM | POA: Diagnosis not present

## 2021-10-29 DIAGNOSIS — R410 Disorientation, unspecified: Secondary | ICD-10-CM | POA: Diagnosis not present

## 2021-10-29 DIAGNOSIS — J9621 Acute and chronic respiratory failure with hypoxia: Secondary | ICD-10-CM | POA: Diagnosis not present

## 2021-10-29 DIAGNOSIS — J9601 Acute respiratory failure with hypoxia: Secondary | ICD-10-CM | POA: Diagnosis not present

## 2021-10-29 LAB — CBC
HCT: 30.3 % — ABNORMAL LOW (ref 39.0–52.0)
Hemoglobin: 8.8 g/dL — ABNORMAL LOW (ref 13.0–17.0)
MCH: 27.3 pg (ref 26.0–34.0)
MCHC: 29 g/dL — ABNORMAL LOW (ref 30.0–36.0)
MCV: 94.1 fL (ref 80.0–100.0)
Platelets: 240 10*3/uL (ref 150–400)
RBC: 3.22 MIL/uL — ABNORMAL LOW (ref 4.22–5.81)
RDW: 16.6 % — ABNORMAL HIGH (ref 11.5–15.5)
WBC: 7.1 10*3/uL (ref 4.0–10.5)
nRBC: 0 % (ref 0.0–0.2)

## 2021-10-29 LAB — GLUCOSE, CAPILLARY
Glucose-Capillary: 155 mg/dL — ABNORMAL HIGH (ref 70–99)
Glucose-Capillary: 195 mg/dL — ABNORMAL HIGH (ref 70–99)
Glucose-Capillary: 141 mg/dL — ABNORMAL HIGH (ref 70–99)
Glucose-Capillary: 100 mg/dL — ABNORMAL HIGH (ref 70–99)

## 2021-10-29 LAB — BASIC METABOLIC PANEL
Anion gap: 5 (ref 5–15)
BUN: 24 mg/dL — ABNORMAL HIGH (ref 8–23)
CO2: 34 mmol/L — ABNORMAL HIGH (ref 22–32)
Calcium: 8.2 mg/dL — ABNORMAL LOW (ref 8.9–10.3)
Chloride: 100 mmol/L (ref 98–111)
Creatinine, Ser: 0.77 mg/dL (ref 0.61–1.24)
GFR, Estimated: >60 mL/min (ref >60)
Glucose, Bld: 149 mg/dL — ABNORMAL HIGH (ref 70–99)
Potassium: 4.4 mmol/L (ref 3.5–5.1)
Sodium: 139 mmol/L (ref 135–145)

## 2021-10-29 LAB — BODY FLUID CULTURE W GRAM STAIN: Culture: NO GROWTH

## 2021-10-29 LAB — PHOSPHORUS: Phosphorus: 3.4 mg/dL (ref 2.5–4.6)

## 2021-10-29 LAB — MAGNESIUM: Magnesium: 2.1 mg/dL (ref 1.7–2.4)

## 2021-10-29 MED ORDER — LEVOTHYROXINE SODIUM 25 MCG PO TABS
25.0000 ug | ORAL_TABLET | Freq: Every day | ORAL | Status: DC
  Administered 2021-10-30 – 2021-11-13 (×15): 25 ug via ORAL
  Filled 2021-10-29 (×15): qty 1

## 2021-10-29 MED ORDER — LEVOTHYROXINE SODIUM 50 MCG PO TABS: 25.0000 ug | ORAL_TABLET | Freq: Every day | ORAL | Status: DC

## 2021-10-29 NOTE — Progress Notes (Signed)
NAME:  [name]., MRN:  [id number], DOB:  [birth year], LOS: 37 ADMISSION DATE:  09/28/2021, CONSULTATION DATE: 10/24/2021 REFERRING MD: Dr. Delana Meyer, CHIEF COMPLAINT: Hypoxia   History of Present Illness:  This is a 66 yo male who presented to [facility] ER on 06/1 via EMS from home with c/o left lower extremity erythema, warmth, and discomfort to the left lower leg.  The pt also had concerns regarding fluid overload due to increased swelling.  He reported he went to the heart failure clinic 2 days prior to ER presentation and was given iv lasix.  He previously has had a partial calcanectomy and has been followed by podiatry and the wound care clinic with wound vac in place.    ED Course  Upon arrival to the ER MR Left Foot revealed substantially increased osteomyelitis of the calcaneus; partial rupture of the plantar fascia; a large plantar heel ulcer extending to the bony margin of the calcaneus; and diffuse cellulitis along the left heel.  On call podiatrist Dr. Cleda Mccreedy consulted by ER provider.  Pt received ceftriaxone and flagyl.  CXR obtained and revealed pulmonary edema, therefore pt received iv lasix and placed on oxygen.  He was subsequently admitted to the telemetry unit for additional workup and treatment.  See detailed hospital course listed under significant events.    Pertinent  Medical History  Atrial Fibrillation  Asthma  COPD CAD Depression  Type II Diabetes Mellitus Gout  Anabolic Steroid Use HLD HTN Hypogonadism in Male Morbid Obesity MI PVD Perirectal Abscess  Pluerisy  OSA~CPAP qhs Varicella Chronic Diastolic CHF   Significant Hospital Events: Including procedures, antibiotic start and stop dates in addition to other pertinent events   06/1: Pt admitted to the [facility] unit with acute on chronic diastolic CHF and left heel osteomyelitis  06/2: Podiatry consulted for left foot osteomyelitis and felt left foot not salvageable and will require higher amputation.   Vascular surgery consulted  06/4: ID consulted for MRSA bacteremia likely source left heel wound  06/6: Pt underwent left BKA per vascular surgery  06/6 to 06/17: Pt with worsening acute on chronic hypoxic respiratory failure secondary to volume overload due to diastolic CHF.  CXR revealed new pleural effusion.  IV lasix resumed.  Requiring Bipap or Ailey  06/21 to 06/27: mentation waxed and waned requiring Bipap during the day due to hypoxia  06/27: Pt requiring 100% HHFNC and transfer to the stepdown unit.  PCCM team consulted to assist with management  6/28: Pt tolerating [place] @ 50L/85%; Underwent Right-sided thoracentesis with removal of 1.2L of pleural fluid 6/29: Tolerating 45L/70% FiO2 with O2 sats 100%; remains on levophed gtt '@4'$  mcg/min to maintain map >65 6/30: altered mental status, but not from hypercapnia  7/1: Pt with intermittent agitation with worsening delirium that started 6/20.  Received prn seroquel and haldol overnight due to sleep deprivation.  This am pt calm with improvement in mentation  7/2: Pt lethargic after receiving prn haldol x1 dose and seroquel x1 dose.  Tolerating Bipap '@30'$ % FiO2.  If pt remains stable throughout the day will transfer service to [facility] starting 07/3 and PCCM team will sign off   Cultures:   6/1 BCx: 1/4 bottles MRSA 6/4 L Foot Wound: MRSA 6/5 BCx: NG 6/24 UCx: 60k CFU / mL Citrobacter freundii  6/28: Pleural fluid: negative   Antimicrobials:   Zosyn 6/1 x 1 Vancomycin 6/1 x 1, 6/3 >> 6/6 Metronidazole 6/2 >> 6/6 Ceftriaxone 6/2 >> 6/5, 6/25 >> 6/27  Daptomycin 6/7 >>6/27  Bactrim 6/27 x 1 Cefepime 6/27 >>6/29 Linezolid 6/27>>  Interim History / Subjective:  As outlined above under significant events   Objective   Blood pressure 106/75, pulse (!) 231, temperature 98.5 F (36.9 C), temperature source Axillary, resp. rate 16, height '6\' 4"'$  (1.93 m), weight (!) 154.4 kg, SpO2 (!) 73 %.    FiO2 (%):  [30 %-42 %] 30 %   Intake/Output  Summary (Last 24 hours) at 10/29/2021 0754 Last data filed at 10/29/2021 0600 Gross per 24 hour  Intake --  Output 3350 ml  Net -3350 ml   Filed Weights   10/25/21 0500 10/27/21 0500 10/28/21 0500  Weight: (!) 157.8 kg (!) 155.7 kg (!) 154.4 kg    Examination: General: Acute on chronically ill appearing male, NAD on NIV  HENT: Supple, no JVD  Lungs: Diminished throughout, even, non labored  Cardiovascular: Sinus tach, rrr, no r/g, 2+ radial/1+ distal pulses Abdomen: +BS x4, obese, soft, non tender  Extremities: Left BKA, chronic RLE vascular changes  Neuro: Somnolent and confused, follows commands, PERRLA GU: Indwelling foley catheter draining yellow urine   Resolved Hospital Problem list     Assessment & Plan:  Acute on chronic hypoxic respiratory failure secondary to acute diastolic CHF exacerbation, pneumonia, and right-sided pleural effusion s/p right sided thoracentesis w/ removal of 1.2L of pleural fluid 10/25/21 OSA Hx: Asthma and Obesity  - Supplemental O2 for dyspnea and/or hypoxia  - Bipap qhs and prn - Continue abx as outlined above  - Scheduled and prn bronchodilator therapy - Nebulized steroids   Acute on chronic diastolic CHF Paroxysmal atrial fibrillation  Hypotension suspect secondary to cardiac medications~ ~resolved  Hx: HLD, CAD, HTN, HLD, and MI Echo 10/05/21 revealed EF 55 to 60% - Continuous telemetry monitoring  - Discontinued iv lasix due to severe alkalosis and worsening encephalopathy if diuresis needed will give diamox  - Cardiology consulted appreciate input: continue amiodarone, apixaban, aspirin, ezetimibe, and finasteride - Hold outpatient diltiazem and metoprolol due to hypotension - Continue scheduled midodrine   Left lower extremity osteomyelitis s/p left BKA 06/6 Pneumonia  MRSA bacteremia secondary osteomyelitis  Citrobacter freundii UTI  - Trend WBC and monitor fever curve  - Trend PCT and lactic acid  - Abx as outlined above    Urinary retention - Strict I&O's - Continue flomax   Type II Diabetes Mellitus  - CBG's ac/hs - SSI, scheduled novolog 10 units 3 times a day with meals, and semglee   Acute encephalopathy multifactorial secondary to infectious process and metabolic derangements - Frequent reorientation  - Continue prn seroquel and haldol, follow Qtc  Best Practice (right click and "Reselect all SmartList Selections" daily)   Diet/type: Regular consistency (see orders) DVT prophylaxis: apixaban  GI prophylaxis: [name] Lines: [name] Foley:  Yes, and it is still needed Code Status:  full code Last date of multidisciplinary goals of care discussion [10/26/2021]  Labs   CBC: Recent Labs  Lab 10/26/21 0546 10/27/21 0602 10/27/21 1033 10/28/21 0543 10/29/21 0455  WBC 7.7  7.7 6.9 7.3 7.0 7.1  NEUTROABS 5.2  --  4.9  --   --   HGB 8.7*  8.6* 8.9* 8.8* 8.1* 8.8*  HCT 30.5*  30.4* 31.1* 30.5* 27.7* 30.3*  MCV 95.9  95.6 93.4 94.1 93.6 94.1  PLT 213  212 217 236 226 628    Basic Metabolic Panel: Recent Labs  Lab 10/25/21 0431 10/26/21 0546 10/27/21 0602 10/28/21 0543 10/29/21 0455  NA  140 141 139 141 139  K 4.1 4.2 4.2 4.3 4.4  CL 90* 92* 92* 100 100  CO2 38* 39* 36* 36* 34*  GLUCOSE 166* 235* 234* 192* 149*  BUN 50* 52* 38* 28* 24*  CREATININE 1.34* 1.41* 0.95 0.84 0.77  CALCIUM 8.4* 8.5* 8.3* 8.3* 8.2*  MG 2.5* 2.6* 2.4 2.3 2.1  PHOS 4.1 4.1 2.4* 3.0 3.4   GFR: Estimated Creatinine Clearance: 146.2 mL/min (by C-G formula based on SCr of 0.77 mg/dL). Recent Labs  Lab 10/24/21 1806 10/25/21 0431 10/27/21 0602 10/27/21 1033 10/28/21 0543 10/29/21 0455  PROCALCITON 0.27  --   --   --   --   --   WBC  --    < > 6.9 7.3 7.0 7.1  LATICACIDVEN 0.9  --   --   --   --   --    < > = values in this interval not displayed.    Liver Function Tests: Recent Labs  Lab 10/26/21 0546  ALBUMIN 2.2*   No results for input(s): "LIPASE", "AMYLASE" in the last 168 hours. Recent  Labs  Lab 10/27/21 1033  AMMONIA <10    ABG    Component Value Date/Time   PHART 7.51 (H) 10/27/2021 1139   PCO2ART 50 (H) 10/27/2021 1139   PO2ART 67 (L) 10/27/2021 1139   HCO3 39.9 (H) 10/27/2021 1139   O2SAT 95.9 10/27/2021 1139     Coagulation Profile: No results for input(s): "INR", "PROTIME" in the last 168 hours.  Cardiac Enzymes: Recent Labs  Lab 10/23/21 0554  CKTOTAL 19*    HbA1C: Hgb A1c MFr Bld  Date/Time Value Ref Range Status  08/05/2021 04:27 AM 10.2 (H) 4.8 - 5.6 % Final    Comment:    (NOTE) Pre diabetes:          5.7%-6.4%  Diabetes:              >6.4%  Glycemic control for   <7.0% adults with diabetes   05/09/2021 07:20 AM 9.2 (H) 4.8 - 5.6 % Final    Comment:    (NOTE) Pre diabetes:          5.7%-6.4%  Diabetes:              >6.4%  Glycemic control for   <7.0% adults with diabetes     CBG: Recent Labs  Lab 10/27/21 2131 10/28/21 0739 10/28/21 1123 10/28/21 1607 10/28/21 2112  GLUCAP 171* 172* 255* 197* 188*    Review of Systems: Positives in BOLD   Gen: Denies fever, chills, weight change, fatigue, night sweats HEENT: Denies blurred vision, double vision, hearing loss, tinnitus, sinus congestion, rhinorrhea, sore throat, neck stiffness, dysphagia PULM: Denies shortness of breath, cough, sputum production, hemoptysis, wheezing CV: Denies chest pain, edema, orthopnea, paroxysmal nocturnal dyspnea, palpitations GI: Denies abdominal pain, nausea, vomiting, diarrhea, hematochezia, melena, constipation, change in bowel habits GU: Denies dysuria, hematuria, polyuria, oliguria, urethral discharge Endocrine: Denies hot or cold intolerance, polyuria, polyphagia or appetite change Derm: Denies rash, dry skin, scaling or peeling skin change Heme: Denies easy bruising, bleeding, bleeding gums Neuro: Denies headache, numbness, weakness, slurred speech, loss of memory or consciousness  Past Medical History:  He,  has a past medical  history of Arrhythmia, Asthma, CHF (congestive heart failure) ([place]), COPD (chronic obstructive pulmonary disease) ([place]), Coronary artery disease, Depression, Diabetes mellitus without complication ([place]), Gout, History anabolic steroid use, Hyperlipidemia, Hypertension, Hypogonadism in male, Morbid obesity ([place]), Myocardial infarction [facility]), Peripheral vascular  disease ([place]), Perirectal abscess, Pleurisy, Sleep apnea, and Varicella.   Surgical History:   Past Surgical History:  Procedure Laterality Date   ABDOMINAL AORTIC ANEURYSM REPAIR     ACHILLES TENDON SURGERY Left 01/10/2021   Procedure: ACHILLES LENGTHENING/KIDNER;  Surgeon: Caroline More, DPM;  Location: [facility] ORS;  Service: Podiatry;  Laterality: Left;   AMPUTATION Left 10/03/2021   Procedure: AMPUTATION BELOW KNEE;  Surgeon: Katha Cabal, MD;  Location: [facility] ORS;  Service: Vascular;  Laterality: Left;   AMPUTATION TOE Right 02/10/2016   Procedure: AMPUTATION TOE 3RD TOE;  Surgeon: Samara Deist, DPM;  Location: [facility] OR[facility];  Service: Podiatry;  Laterality: Right;   AMPUTATION TOE Left 02/24/2020   Procedure: AMPUTATION TOE;  Surgeon: Caroline More, DPM;  Location: [facility] ORS;  Service: Podiatry;  Laterality: Left;   APPLICATION OF WOUND VAC Left 02/29/2020   Procedure: APPLICATION OF WOUND VAC;  Surgeon: Caroline More, DPM;  Location: [facility] ORS;  Service: Podiatry;  Laterality: Left;   COLONOSCOPY WITH PROPOFOL [name] 11/18/2015   Procedure: COLONOSCOPY WITH PROPOFOL;  Surgeon: Manya Silvas, MD;  Location: [facility] ENDOSCOPY;  Service: Endoscopy;  Laterality: [name];   CORONARY ARTERY BYPASS GRAFT     CORONARY STENT INTERVENTION [name] 02/02/2020   Procedure: CORONARY STENT INTERVENTION;  Surgeon: Isaias Cowman, MD;  Location: [place] CV LAB;  Service: Cardiovascular;  Laterality: [name];   INCISION AND DRAINAGE Left 08/07/2021   Procedure: INCISION AND DRAINAGE-Partial Calcanectomy;  Surgeon: Caroline More, DPM;  Location: [facility] ORS;   Service: Podiatry;  Laterality: Left;   IRRIGATION AND DEBRIDEMENT FOOT Left 02/29/2020   Procedure: IRRIGATION AND DEBRIDEMENT FOOT;  Surgeon: Caroline More, DPM;  Location: [facility] OR[facility];  Service: Podiatry;  Laterality: Left;   IRRIGATION AND DEBRIDEMENT FOOT Left 02/24/2020   Procedure: IRRIGATION AND DEBRIDEMENT FOOT;  Surgeon: Caroline More, DPM;  Location: [facility] ORS;  Service: Podiatry;  Laterality: Left;   KNEE ARTHROSCOPY     LEFT HEART CATH AND CORS/GRAFTS ANGIOGRAPHY [name] 02/02/2020   Procedure: LEFT HEART CATH AND CORS/GRAFTS ANGIOGRAPHY;  Surgeon: Teodoro Spray, MD;  Location: [place] CV LAB;  Service: Cardiovascular;  Laterality: [name];   LOWER EXTREMITY ANGIOGRAPHY Left 02/25/2020   Procedure: Lower Extremity Angiography;  Surgeon: Algernon Huxley, MD;  Location: [place] CV LAB;  Service: Cardiovascular;  Laterality: Left;   LOWER EXTREMITY ANGIOGRAPHY Left 01/04/2021   Procedure: LOWER EXTREMITY ANGIOGRAPHY;  Surgeon: Algernon Huxley, MD;  Location: [place] CV LAB;  Service: Cardiovascular;  Laterality: Left;   METATARSAL HEAD EXCISION Left 01/10/2021   Procedure: METATARSAL HEAD EXCISION - LEFT 5th;  Surgeon: Caroline More, DPM;  Location: [facility] ORS;  Service: Podiatry;  Laterality: Left;   PERIPHERAL VASCULAR CATHETERIZATION Right 01/24/2016   Procedure: Lower Extremity Angiography;  Surgeon: Katha Cabal, MD;  Location: [place] CV LAB;  Service: Cardiovascular;  Laterality: Right;   PERIPHERAL VASCULAR CATHETERIZATION Right 01/25/2016   Procedure: Lower Extremity Angiography;  Surgeon: Katha Cabal, MD;  Location: [place] CV LAB;  Service: Cardiovascular;  Laterality: Right;   TOE AMPUTATION     TONSILLECTOMY       Social History:   reports that he quit smoking about 6 years ago. His smoking use included cigarettes. He has a 22.50 pack-year smoking history. He has never used smokeless tobacco. He reports that he does not currently use alcohol after a past  usage of about 3.0 standard drinks of alcohol per week. He reports that he does not use drugs.  Family History:  His family history includes Alcohol abuse in his father; Coronary artery disease in his father; Heart failure in his brother; Hypertension in his father.   Allergies Allergies  Allergen Reactions   Penicillins Other (See Comments)    Happened at 66 years old and pt. stated he passed out  He has tolerated amoxicillin/clavulanate and ampicillin/sulbactam   Statins     Other reaction(s): Muscle Pain Causes legs to ache per pt     Home Medications  Prior to Admission medications   Medication Sig Start Date End Date Taking? Authorizing Provider  amiodarone (PACERONE) 200 MG tablet Take 200 mg by mouth daily.   Yes [provider]  amoxicillin-clavulanate (AUGMENTIN) 875-125 MG tablet Take 1 tablet by mouth 2 (two) times daily. 09/18/21  Yes Tsosie Billing, MD  apixaban (ELIQUIS) 5 MG TABS tablet Take 1 tablet (5 mg total) by mouth 2 (two) times daily. 01/26/16  Yes Stegmayer, Janalyn Harder, PA-C  aspirin EC 81 MG EC tablet Take 1 tablet (81 mg total) by mouth daily. Swallow whole. 05/17/21  Yes Dessa Phi, DO  Cholecalciferol 25 MCG (1000 UT) tablet Take 1,000 Units by mouth daily.   Yes [provider]  Cyanocobalamin (VITAMIN B-12) 5000 MCG TBDP Take 10,000 mcg by mouth daily.   Yes [provider]  diltiazem (CARDIZEM CD) 300 MG 24 hr capsule Take 1 capsule (300 mg total) by mouth daily. 01/16/21  Yes Bonnielee Haff, MD  ezetimibe (ZETIA) 10 MG tablet Take 10 mg by mouth at bedtime.   Yes [provider]  ferrous sulfate 325 (65 FE) MG tablet Take 325 mg by mouth daily with breakfast.   Yes [provider]  gabapentin (NEURONTIN) 300 MG capsule Take 300 mg by mouth at bedtime. 12/02/20  Yes [provider]  HUMALOG 100 UNIT/ML injection Inject 15 Units into the skin in the morning and at bedtime. Sliding scale 08/18/21   Yes [provider]  insulin glargine (LANTUS) 100 UNIT/ML Solostar Pen Inject 25 Units into the skin in the morning and at bedtime. 08/11/21  Yes Fritzi Mandes, MD  isosorbide mononitrate (IMDUR) 60 MG 24 hr tablet Take 60 mg by mouth 2 (two) times daily. 04/21/21  Yes [provider]  losartan (COZAAR) 50 MG tablet Take 50 mg by mouth daily.   Yes [provider]  metFORMIN (GLUCOPHAGE) 1000 MG tablet Take 1,000 mg by mouth 2 (two) times daily.  12/02/17  Yes [provider]  metoprolol tartrate (LOPRESSOR) 50 MG tablet Take 100 mg by mouth 2 (two) times daily. 04/21/21  Yes [provider]  Multiple Vitamins-Minerals (MULTIVITAMIN WITH MINERALS) tablet Take 1 tablet by mouth daily.   Yes [provider]  nitroGLYCERIN (NITROSTAT) 0.4 MG SL tablet Place 0.4 mg under the tongue every 5 (five) minutes as needed for chest pain.   Yes [provider]  pramipexole (MIRAPEX) 1 MG tablet Take 2 mg by mouth at bedtime. 11/09/20  Yes [provider]  primidone (MYSOLINE) 250 MG tablet Take 250 mg by mouth 2 (two) times daily. 01/23/20  Yes [provider]  tamsulosin (FLOMAX) 0.4 MG CAPS capsule Take 0.4 mg by mouth daily.   Yes [provider]  torsemide (DEMADEX) 20 MG tablet Take 3 tablets (60 mg total) by mouth daily. 09/26/21  Yes Hackney, Otila Kluver A, FNP  traMADol (ULTRAM) 50 MG tablet Take 1 tablet (50 mg total) by mouth daily as needed for moderate pain. 08/11/21  Yes  Fritzi Mandes, MD  vitamin C (ASCORBIC ACID) 500 MG tablet Take 500 mg by mouth daily.   Yes [provider]  zinc gluconate 50 MG tablet Take 50 mg by mouth daily.   Yes [provider]  Continuous Blood Gluc Sensor (FREESTYLE LIBRE 2 SENSOR) MISC  09/08/21   [provider]     Critical care time: 30 minutes     Donell Beers, [place] Pager [phone] (please enter 7 digits) PCCM Consult Pager  [phone] (please enter 7 digits)

## 2021-10-29 NOTE — Progress Notes (Signed)
[facility] Cardiology  SUBJECTIVE: Patient laying flat in bed, denies chest pain or shortness of breath   Vitals:   10/29/21 0500 10/29/21 0600 10/29/21 0700 10/29/21 0800  BP: 93/64 106/75 118/79 101/71  Pulse: (!) 117 (!) 231 (!) 115 (!) 117  Resp: '12 16 15 '$ (!) 21  Temp:    98.8 F (37.1 C)  TempSrc:    Axillary  SpO2: 93% (!) 73% 92% 93%  Weight:      Height:         Intake/Output Summary (Last 24 hours) at 10/29/2021 0841 Last data filed at 10/29/2021 0600 Gross per 24 hour  Intake --  Output 3350 ml  Net -3350 ml      PHYSICAL EXAM  General: Well developed, well nourished, in no acute distress HEENT:  Normocephalic and atramatic Neck:  No JVD.  Lungs: Clear bilaterally to auscultation and percussion. Heart: HRRR . Normal S1 and S2 without gallops or murmurs.  Abdomen: Bowel sounds are positive, abdomen soft and non-tender  Msk:  Back normal, normal gait. Normal strength and tone for age. Extremities: Left BKA Neuro: Alert and oriented X 3. Psych:  Good affect, responds appropriately   LABS: Basic Metabolic Panel: Recent Labs    10/28/21 0543 10/29/21 0455  NA 141 139  K 4.3 4.4  CL 100 100  CO2 36* 34*  GLUCOSE 192* 149*  BUN 28* 24*  CREATININE 0.84 0.77  CALCIUM 8.3* 8.2*  MG 2.3 2.1  PHOS 3.0 3.4   Liver Function Tests: No results for input(s): "AST", "ALT", "ALKPHOS", "BILITOT", "PROT", "ALBUMIN" in the last 72 hours. No results for input(s): "LIPASE", "AMYLASE" in the last 72 hours. CBC: Recent Labs    10/27/21 1033 10/28/21 0543 10/29/21 0455  WBC 7.3 7.0 7.1  NEUTROABS 4.9  --   --   HGB 8.8* 8.1* 8.8*  HCT 30.5* 27.7* 30.3*  MCV 94.1 93.6 94.1  PLT 236 226 240   Cardiac Enzymes: No results for input(s): "CKTOTAL", "CKMB", "CKMBINDEX", "TROPONINI" in the last 72 hours. BNP: Invalid input(s): "POCBNP" D-Dimer: No results for input(s): "DDIMER" in the last 72 hours. Hemoglobin A1C: No results for input(s): "HGBA1C" in the last 72  hours. Fasting Lipid Panel: No results for input(s): "CHOL", "HDL", "LDLCALC", "TRIG", "CHOLHDL", "LDLDIRECT" in the last 72 hours. Thyroid Function Tests: Recent Labs    10/27/21 1033  TSH 4.833*   Anemia Panel: Recent Labs    10/27/21 1033  VITAMINB12 2,327*    [place] EKG SITE RITE  Result Date: 10/27/2021 If Site Rite image not attached, placement could not be confirmed due to current cardiac rhythm.    Echo LVEF 55-60% 10/05/2021  TELEMETRY: Atrial flutter 116 bpm:  ASSESSMENT AND PLAN:  Principal Problem:   Acute delirium Active Problems:   Essential hypertension, benign   RLS (restless legs syndrome)   Osteomyelitis (HCC)   Obesity, Class III, BMI 40-49.9 (morbid obesity) (HCC)   Acute on chronic diastolic CHF (congestive heart failure) (HCC)   AF (paroxysmal atrial fibrillation) ([place])   Uncontrolled type 2 diabetes mellitus with hypoglycemia, with long-term current use of insulin (HCC)   Malnutrition of moderate degree   Generalized weakness   Acute respiratory failure with hypoxia and hypercapnia (HCC)   OSA (obstructive sleep apnea)   Sepsis due to methicillin resistant Staphylococcus aureus (MRSA) (HCC)   Acute on chronic respiratory failure with hypoxia and hypercapnia (HCC)   Acute urinary retention   Hyperkalemia   Iron deficiency anemia  UTI (urinary tract infection)    1. Acute on chronic diastolic congestive heart failure, HFpEF, LVEF 55-60% 10/05/21, good diuresis after IV furosemide but remains on supplemental O2 2.  Paroxysmal atrial fibrillation/atrial flutter, currently in atrial flutter heart rate 116 bpm, on amiodarone for rate control and Eliquis for stroke prevention 3.  Coronary artery disease, status post CABG 2006, PCI RCA 01/2020, currently without chest pain 4.  Left foot osteomyelitis on IV linezolid, left BKA 10/03/2021 by Dr. Delana Meyer 5.  Acute kidney injury, improving to Cr 0.77 and >60  6.  MRSA bacteremia, osteomyelitis of L foot likely  source, treated with IV daptomycin, repeat echo ordered by ID, resulted with thickened aortic and mitral leaflets without obvious vegetation.      Recommendations   1.  Agree with current therapy 2.  Continue Eliquis for stroke prevention 3.  Continue amiodarone 200 mg daily for rate control 4.  Furosemide IV as needed   Isaias Cowman, MD, PhD, [facility] 10/29/2021 8:41 AM

## 2021-10-29 NOTE — Progress Notes (Signed)
[place] for Electrolyte Monitoring and Replacement   Recent Labs: Potassium (mmol/L)  Date Value  10/29/2021 4.4  07/12/2011 4.4   Magnesium (mg/dL)  Date Value  10/29/2021 2.1   Calcium (mg/dL)  Date Value  10/29/2021 8.2 (L)   Calcium, Total (mg/dL)  Date Value  07/12/2011 8.8   Albumin (g/dL)  Date Value  10/26/2021 2.2 (L)   Phosphorus (mg/dL)  Date Value  10/29/2021 3.4   Sodium (mmol/L)  Date Value  10/29/2021 139  07/12/2011 141   Assessment: 11 YOM admitted with wound infection. Found to have osteomyelitis of L heel. PMH significant for uncontrolled F6CL, chronic diastolic dysfunction CHF, HTN, CAD, COPD, afib, morbid obesity, OSA, s/p recent wound debridement and partial calcanectomy (05/16). Pharmacy is asked to follow and replace electrolytes while in the CCU  Goal of Therapy:  Potassium 4.0 - 5.1 mmol/L Magnesium 2.0 - 2.4 mg/dL All Other Electrolytes WNL  Plan:  No electrolyte replacement warranted for today Because this consult was generated as part of an ICU order set and patient is transferring pharmacy will sign off for now Please feel free to reach out if any further assistance is needed  Dallie Piles 10/29/2021 6:45 AM

## 2021-10-29 NOTE — Plan of Care (Signed)

## 2021-10-29 NOTE — TOC Progression Note (Signed)
Transition of Care (TOC) - Progression Note    Patient Details  Name: [name] MRN: [id number] Date of Birth: 02/09/1956  Transition of Care [facility]) CM/SW Contact  Shelbie Hutching, RN Phone Number: 10/29/2021, 11:53 AM  Clinical Narrative:    TOC continues to follow, patient remains stepdown status in the ICU on HFNC, tolerated bipap last night.  Wife has not been agreeable to SNF.  She was asking for a letter from [facility] about patient's current status for a guardianship hearing tomorrow, Monday.  RNCM does not provide that type of documentation, and guardianship is not warranted as the patient's wife is next of kin and can make medical decisions when the patient is unable.    TOC will cont to follow- will ask weekday staff to reach out to LTAC to see if patient may be appropriate.     Expected Discharge Plan: [place] Barriers to Discharge: Continued Medical Work up  Expected Discharge Plan and Services Expected Discharge Plan: [place] In-house Referral: NA   Post Acute Care Choice: Resumption of Svcs/PTA Provider Living arrangements for the past 2 months: Single Family Home                                       Social Determinants of Health (SDOH) Interventions    Readmission Risk Interventions    09/29/2021   11:12 AM  Readmission Risk Prevention Plan  Transportation Screening Complete  PCP or Specialist Appt within 3-5 Days Complete  HRI or [place] Complete  Social Work Consult for [place] Planning/Counseling Complete  Palliative Care Screening Not Applicable  Medication Review Press photographer) Complete

## 2021-10-30 DIAGNOSIS — I509 Heart failure, unspecified: Secondary | ICD-10-CM | POA: Diagnosis not present

## 2021-10-30 DIAGNOSIS — B9562 Methicillin resistant Staphylococcus aureus infection as the cause of diseases classified elsewhere: Secondary | ICD-10-CM | POA: Diagnosis not present

## 2021-10-30 DIAGNOSIS — R410 Disorientation, unspecified: Secondary | ICD-10-CM | POA: Diagnosis not present

## 2021-10-30 DIAGNOSIS — M86272 Subacute osteomyelitis, left ankle and foot: Secondary | ICD-10-CM

## 2021-10-30 DIAGNOSIS — M869 Osteomyelitis, unspecified: Secondary | ICD-10-CM | POA: Diagnosis not present

## 2021-10-30 DIAGNOSIS — I480 Paroxysmal atrial fibrillation: Secondary | ICD-10-CM | POA: Diagnosis not present

## 2021-10-30 DIAGNOSIS — R531 Weakness: Secondary | ICD-10-CM | POA: Diagnosis not present

## 2021-10-30 DIAGNOSIS — J9601 Acute respiratory failure with hypoxia: Secondary | ICD-10-CM | POA: Diagnosis not present

## 2021-10-30 DIAGNOSIS — R7881 Bacteremia: Secondary | ICD-10-CM | POA: Diagnosis not present

## 2021-10-30 LAB — CBC WITH DIFFERENTIAL/PLATELET
Abs Immature Granulocytes: 0.03 10*3/uL (ref 0.00–0.07)
Basophils Absolute: 0.1 10*3/uL (ref 0.0–0.1)
Basophils Relative: 1 %
Eosinophils Absolute: 1.2 10*3/uL — ABNORMAL HIGH (ref 0.0–0.5)
Eosinophils Relative: 13 %
HCT: 31.6 % — ABNORMAL LOW (ref 39.0–52.0)
Hemoglobin: 9.2 g/dL — ABNORMAL LOW (ref 13.0–17.0)
Immature Granulocytes: 0 %
Lymphocytes Relative: 18 %
Lymphs Abs: 1.6 10*3/uL (ref 0.7–4.0)
MCH: 27.5 pg (ref 26.0–34.0)
MCHC: 29.1 g/dL — ABNORMAL LOW (ref 30.0–36.0)
MCV: 94.6 fL (ref 80.0–100.0)
Monocytes Absolute: 0.6 10*3/uL (ref 0.1–1.0)
Monocytes Relative: 6 %
Neutro Abs: 5.4 10*3/uL (ref 1.7–7.7)
Neutrophils Relative %: 62 %
Platelets: 302 10*3/uL (ref 150–400)
RBC: 3.34 MIL/uL — ABNORMAL LOW (ref 4.22–5.81)
RDW: 17 % — ABNORMAL HIGH (ref 11.5–15.5)
WBC: 8.9 10*3/uL (ref 4.0–10.5)
nRBC: 0 % (ref 0.0–0.2)

## 2021-10-30 LAB — CBC
HCT: 31.3 % — ABNORMAL LOW (ref 39.0–52.0)
Hemoglobin: 9.2 g/dL — ABNORMAL LOW (ref 13.0–17.0)
MCH: 27.3 pg (ref 26.0–34.0)
MCHC: 29.4 g/dL — ABNORMAL LOW (ref 30.0–36.0)
MCV: 92.9 fL (ref 80.0–100.0)
Platelets: 272 10*3/uL (ref 150–400)
RBC: 3.37 MIL/uL — ABNORMAL LOW (ref 4.22–5.81)
RDW: 16.7 % — ABNORMAL HIGH (ref 11.5–15.5)
WBC: 8.9 10*3/uL (ref 4.0–10.5)
nRBC: 0 % (ref 0.0–0.2)

## 2021-10-30 LAB — GLUCOSE, CAPILLARY
Glucose-Capillary: 57 mg/dL — ABNORMAL LOW (ref 70–99)
Glucose-Capillary: 63 mg/dL — ABNORMAL LOW (ref 70–99)
Glucose-Capillary: 71 mg/dL (ref 70–99)
Glucose-Capillary: 102 mg/dL — ABNORMAL HIGH (ref 70–99)
Glucose-Capillary: 83 mg/dL (ref 70–99)
Glucose-Capillary: 142 mg/dL — ABNORMAL HIGH (ref 70–99)
Glucose-Capillary: 189 mg/dL — ABNORMAL HIGH (ref 70–99)
Glucose-Capillary: 202 mg/dL — ABNORMAL HIGH (ref 70–99)

## 2021-10-30 LAB — T3, FREE: T3, Free: 2.3 pg/mL (ref 2.0–4.4)

## 2021-10-30 LAB — HEPATIC FUNCTION PANEL
ALT: 46 U/L — ABNORMAL HIGH (ref 0–44)
AST: 25 U/L (ref 15–41)
Albumin: 2.3 g/dL — ABNORMAL LOW (ref 3.5–5.0)
Alkaline Phosphatase: 321 U/L — ABNORMAL HIGH (ref 38–126)
Bilirubin, Direct: 0.2 mg/dL (ref 0.0–0.2)
Indirect Bilirubin: 0.3 mg/dL (ref 0.3–0.9)
Total Bilirubin: 0.5 mg/dL (ref 0.3–1.2)
Total Protein: 6.5 g/dL (ref 6.5–8.1)

## 2021-10-30 LAB — BASIC METABOLIC PANEL
Anion gap: 5 (ref 5–15)
BUN: 18 mg/dL (ref 8–23)
CO2: 32 mmol/L (ref 22–32)
Calcium: 8.4 mg/dL — ABNORMAL LOW (ref 8.9–10.3)
Chloride: 103 mmol/L (ref 98–111)
Creatinine, Ser: 0.73 mg/dL (ref 0.61–1.24)
GFR, Estimated: >60 mL/min (ref >60)
Glucose, Bld: 61 mg/dL — ABNORMAL LOW (ref 70–99)
Potassium: 4.6 mmol/L (ref 3.5–5.1)
Sodium: 140 mmol/L (ref 135–145)

## 2021-10-30 LAB — PHOSPHORUS: Phosphorus: 3.2 mg/dL (ref 2.5–4.6)

## 2021-10-30 LAB — MAGNESIUM: Magnesium: 2 mg/dL (ref 1.7–2.4)

## 2021-10-30 MED ORDER — DEXTROSE 50 % IV SOLN
INTRAVENOUS | Status: AC
  Administered 2021-10-30: 12.5 g
  Filled 2021-10-30: qty 50

## 2021-10-30 NOTE — Progress Notes (Signed)
PROGRESS NOTE    [name].  PIR:[id number] DOB: [birth year] DOA: 09/28/2021 PCP: [name], MD   Assessment & Plan:   Principal Problem:   Acute delirium Active Problems:   Sepsis due to methicillin resistant Staphylococcus aureus (MRSA) ([place])   Acute on chronic respiratory failure with hypoxia and hypercapnia (HCC)   UTI (urinary tract infection)   Acute on chronic diastolic CHF (congestive heart failure) ([place])   Uncontrolled type 2 diabetes mellitus with hypoglycemia, with long-term current use of insulin (HCC)   AF (paroxysmal atrial fibrillation) (HCC)   Obesity, Class III, BMI 40-49.9 (morbid obesity) ([place])   Essential hypertension, benign   Osteomyelitis (HCC)   RLS (restless legs syndrome)   Acute urinary retention   Malnutrition of moderate degree   Generalized weakness   Acute respiratory failure with hypoxia and hypercapnia (HCC)   OSA (obstructive sleep apnea)   Hyperkalemia   Iron deficiency anemia  Assessment and Plan: Sepsis: due to methicillin resistant Staphylococcus aureus. See Dr. Marshia Ly notes on how pt met sepsis criteria. Blood cxs growing MRSA. Repeat blood cx NGTD. Likely secondary to osteomyelitis. Continue on linezolid.    UTI: urine cx growing citrobacter. Completed abx course    Acute delirium: labile mental status. Continue on risperidone. Seroquel qhs prn.    Acute on chronic diastolic CHF: EF 55 to 63% from an echo on 01/23. Holding lasix    DM2: poorly controlled, HbA1c 10.2. Continue on glargine, aspart, SSI w/ accuchecks   PAF: continue on amiodarone, eliquis. Previously was on pressors but since have been weaned off    Morbid obesity: BMI  41.4. Complicates overall care & prognosis   Osteomyelitis of LLE: s/p left BKA on 10/03/2021. Continue on linezolid    HTN: continue on amidoarone, midodrine. Was previously requiring pressors but have since been weaned off    RLS: holding home dose of mirapex   Acute urinary  retention: foley catheter placed on 10/08/2021.  Foley DC'd on 6/16.  Foley needed to be replaced on 10/24/2021   IDA: H&H are labile. Will transfuse if Hb < 7.0    Hyperkalemia: resolved   OSA: CPAP qhs. Qualifies for NIV with elevated PCO2 on ABG.   Acute hypoxic & hypercapnic respiratory failure: continue on supplemental oxygen and wean as tolerated. BiPAP prn for CO2 retention    Generalized weakness: PT/OT will have to re-evaluate pt   Malnutrition of moderate degree: related to acute illness (left heel osteomyelitis) and energy intake of less than 75% for more than 7 days, mild fat depletion.  Continue on nutritional supplements       DVT prophylaxis: eliquis  Code Status: full  Family Communication: called pt's wife, [name], no answer  Disposition Plan: likely d/c to SNF.  Level of care: Stepdown  Status is: Inpatient Remains inpatient appropriate because: severity of illness   Consultants:  ICU Vascular surg ID   Procedures:    Antimicrobials: linezolid    Subjective: Pt is very lethargic.   Objective: Vitals:   10/30/21 0400 10/30/21 0500 10/30/21 0600 10/30/21 0700  BP: 121/71  111/78 114/85  Pulse: (!) 120 (!) 117 (!) 118 (!) 117  Resp: (!) _0 (!) 21  Temp:      TempSrc:      SpO2: (!) 88% (!) 75% 93% (!) 88%  Weight:      Height:        Intake/Output Summary (Last 24 hours) at 10/30/2021 0818 Last data filed  at 10/29/2021 1800 Gross per 24 hour  Intake 570 ml  Output 2000 ml  Net -1430 ml   Filed Weights   10/25/21 0500 10/27/21 0500 10/28/21 0500  Weight: (!) 157.8 kg (!) 155.7 kg (!) 154.4 kg    Examination:  General exam: Appears calm and comfortable  Respiratory system: Clear to auscultation. Respiratory effort normal. Cardiovascular system: irregularly irregular. No rubs, gallops or clicks.  Gastrointestinal system: Abdomen is obese, soft and nontender. Hypoactive bowel sounds heard. Central nervous system: Lethargic. Moves  extremities  Psychiatry: Judgement and insight appears at baseline. Flat mood and affect     Data Reviewed: I have personally reviewed following labs and imaging studies  CBC: Recent Labs  Lab 10/26/21 0546 10/27/21 0602 10/27/21 1033 10/28/21 0543 10/29/21 0455 10/30/21 0338  WBC 7.7  7.7 6.9 7.3 7.0 7.1 8.9  NEUTROABS 5.2  --  4.9  --   --   --   HGB 8.7*  8.6* 8.9* 8.8* 8.1* 8.8* 9.2*  HCT 30.5*  30.4* 31.1* 30.5* 27.7* 30.3* 31.3*  MCV 95.9  95.6 93.4 94.1 93.6 94.1 92.9  PLT 213  212 217 236 226 240 606   Basic Metabolic Panel: Recent Labs  Lab 10/26/21 0546 10/27/21 0602 10/28/21 0543 10/29/21 0455 10/30/21 0338  NA 141 139 141 139 140  K 4.2 4.2 4.3 4.4 4.6  CL 92* 92* 100 100 103  CO2 39* 36* 36* 34* 32  GLUCOSE 235* 234* 192* 149* 61*  BUN 52* 38* 28* 24* 18  CREATININE 1.41* 0.95 0.84 0.77 0.73  CALCIUM 8.5* 8.3* 8.3* 8.2* 8.4*  MG 2.6* 2.4 2.3 2.1 2.0  PHOS 4.1 2.4* 3.0 3.4 3.2   GFR: Estimated Creatinine Clearance: 146.2 mL/min (by C-G formula based on SCr of 0.73 mg/dL). Liver Function Tests: Recent Labs  Lab 10/26/21 0546  ALBUMIN 2.2*   No results for input(s): "LIPASE", "AMYLASE" in the last 168 hours. Recent Labs  Lab 10/27/21 1033  AMMONIA <10   Coagulation Profile: No results for input(s): "INR", "PROTIME" in the last 168 hours. Cardiac Enzymes: No results for input(s): "CKTOTAL", "CKMB", "CKMBINDEX", "TROPONINI" in the last 168 hours. BNP (last 3 results) No results for input(s): "PROBNP" in the last 8760 hours. HbA1C: No results for input(s): "HGBA1C" in the last 72 hours. CBG: Recent Labs  Lab 10/30/21 0507 10/30/21 0532 10/30/21 0617 10/30/21 0652 10/30/21 0740  GLUCAP 57* 63* 71 102* 83   Lipid Profile: No results for input(s): "CHOL", "HDL", "LDLCALC", "TRIG", "CHOLHDL", "LDLDIRECT" in the last 72 hours. Thyroid Function Tests: Recent Labs    10/27/21 1033  TSH 4.833*   Anemia Panel: Recent Labs     10/27/21 1033  VITAMINB12 2,327*   Sepsis Labs: Recent Labs  Lab 10/24/21 1806  PROCALCITON 0.27  LATICACIDVEN 0.9    Recent Results (from the past 240 hour(s))  Urine Culture     Status: Abnormal   Collection Time: 10/21/21  4:42 PM   Specimen: Urine, Clean Catch  Result Value Ref Range Status   Specimen Description   Final    URINE, CLEAN CATCH Performed at [facility], [address] [place], [place] [address]    Special Requests   Final    Normal Performed at [facility], [address] [place],  [address]    Culture 60,000 COLONIES/mL CITROBACTER FREUNDII (A)  Final   Report Status 10/24/2021 FINAL  Final   Organism ID, Bacteria CITROBACTER FREUNDII (A)  Final  Susceptibility   Citrobacter freundii - MIC*    CEFAZOLIN >=64 RESISTANT Resistant     CEFEPIME 0.5 SENSITIVE Sensitive     CEFTRIAXONE >=64 RESISTANT Resistant     CIPROFLOXACIN <=0.25 SENSITIVE Sensitive     GENTAMICIN <=1 SENSITIVE Sensitive     IMIPENEM <=0.25 SENSITIVE Sensitive     NITROFURANTOIN <=16 SENSITIVE Sensitive     TRIMETH/SULFA <=20 SENSITIVE Sensitive     PIP/TAZO 64 INTERMEDIATE Intermediate     * 60,000 COLONIES/mL CITROBACTER FREUNDII  MRSA Next Gen by PCR, Nasal     Status: None   Collection Time: 10/24/21  6:15 PM   Specimen: Nasal Mucosa; Nasal Swab  Result Value Ref Range Status   MRSA by PCR Next Gen NOT DETECTED NOT DETECTED Final    Comment: (NOTE) The GeneXpert MRSA Assay (FDA approved for NASAL specimens only), is one component of a comprehensive MRSA colonization surveillance program. It is not intended to diagnose MRSA infection nor to guide or monitor treatment for MRSA infections. Test performance is not FDA approved in patients less than 109 years old. Performed at [facility], [address] [place], [place] [address]   Body fluid culture w Gram Stain     Status: None   Collection Time: 10/25/21  2:10 PM   Specimen:  PATH Cytology Pleural fluid  Result Value Ref Range Status   Specimen Description   Final    PLEURAL Performed at [facility], [address] [place], [place] [address]    Special Requests   Final    NONE Performed at [facility], [address] [place], [place] [address]    Gram Stain   Final    RARE WBC PRESENT, PREDOMINANTLY PMN NO ORGANISMS SEEN    Culture   Final    NO GROWTH 3 DAYS Performed at [facility], [place] [address] [place], [place] [address]    Report Status 10/29/2021 FINAL  Final         Radiology Studies: No results found.      Scheduled Meds:  amiodarone  200 mg Oral Daily   apixaban  5 mg Oral BID   vitamin C  500 mg Oral Daily   aspirin EC  81 mg Oral Daily   budesonide (PULMICORT) nebulizer solution  0.5 mg Nebulization BID   chlorhexidine  15 mL Mouth Rinse BID   Chlorhexidine Gluconate Cloth  6 each Topical Daily   cholecalciferol  1,000 Units Oral Daily   ezetimibe  10 mg Oral Daily   ferrous sulfate  325 mg Oral Q breakfast   finasteride  5 mg Oral Daily   insulin aspart  0-20 Units Subcutaneous TID WC   insulin aspart  10 Units Subcutaneous TID WC   insulin glargine-yfgn  25 Units Subcutaneous QHS   insulin glargine-yfgn  5 Units Subcutaneous Daily   ipratropium-albuterol  3 mL Nebulization BID   levothyroxine  25 mcg Oral Q0600   linezolid  600 mg Oral Q12H   midodrine  5 mg Oral TID WC   multivitamin with minerals  1 tablet Oral Daily   nystatin   Topical BID   mouth rinse  15 mL Mouth Rinse 4 times per day   primidone  250 mg Oral BID   Ensure Max Protein  11 oz Oral BID   risperiDONE  0.5 mg Oral BID   sodium chloride flush  10-40 mL Intracatheter Q12H   vitamin B-12  1,000 mcg Oral  Daily   Continuous Infusions:  sodium chloride Stopped (10/25/21 2209)     LOS: 32 days    Time spent: 30 mins     Wyvonnia Dusky, MD [facility] Pager [phone]  If 7PM-7AM, please  contact night-coverage [email or web address] 10/30/2021, 8:18 AM

## 2021-10-30 NOTE — Inpatient Diabetes Management (Signed)
Inpatient Diabetes Program Recommendations  AACE/ADA: New Consensus Statement on Inpatient Glycemic Control (2015)  Target Ranges:  Prepandial:   less than 140 mg/dL      Peak postprandial:   less than 180 mg/dL (1-2 hours)      Critically ill patients:  140 - 180 mg/dL    Latest Reference Range & Units 10/29/21 08:08 10/29/21 11:20 10/29/21 15:49 10/29/21 21:07  Glucose-Capillary 70 - 99 mg/dL 155 (H)  14 units Novolog  5 units Semglee '@0902'$   195 (H)  14 units Novolog  141 (H)  13 units Novolog '@1708'$  100 (H)     25 units Semglee '@2137'$     Latest Reference Range & Units 10/30/21 05:07 10/30/21 05:32 10/30/21 06:17 10/30/21 06:52  Glucose-Capillary 70 - 99 mg/dL 57 (L) 63 (L) 71 102 (H)     Home DM Meds: Lantus 25 units BID     Humalog 15 units BID     Metformin 1000 mg BID   Current Orders: Semglee 5 units QAM      Semglee 25 units QHS      Novolog Resistant Correction Scale/ SSI (0-20 units) TID AC       Novolog 10 units TID with meals     MD- Note pt with Hypoglycemia this AM.  Please consider:  1. Stop AM dose Semglee 5 units  2. Reduce PM dose Semglee slightly to 20 units QHS     --Will follow patient during hospitalization--  Wyn Quaker RN, MSN, CDE Diabetes Coordinator Inpatient Glycemic Control Team Team Pager: [phone] (8a-5p)

## 2021-10-30 NOTE — Progress Notes (Signed)
Date of Admission:  09/28/2021    ID: [name] is a 66 y.o. male  Principal Problem:   Acute delirium Active Problems:   Essential hypertension, benign   RLS (restless legs syndrome)   Osteomyelitis (HCC)   Obesity, Class III, BMI 40-49.9 (morbid obesity) (HCC)   Acute on chronic diastolic CHF (congestive heart failure) (HCC)   AF (paroxysmal atrial fibrillation) ([place])   Uncontrolled type 2 diabetes mellitus with hypoglycemia, with long-term current use of insulin (HCC)   Malnutrition of moderate degree   Generalized weakness   Acute respiratory failure with hypoxia and hypercapnia (HCC)   OSA (obstructive sleep apnea)   Sepsis due to methicillin resistant Staphylococcus aureus (MRSA) (HCC)   Acute on chronic respiratory failure with hypoxia and hypercapnia (HCC)   Acute urinary retention   Hyperkalemia   Iron deficiency anemia   UTI (urinary tract infection)  74yrmale with h/o DM, HTN, CAD, S/p CABG, CHF, venous edema, lymphedema legs, s/p amputation of multiple toes of both feet, acute heel ulcer left with calcaneal osteomyelitis for which he underwent partial calcanectomy in april 2023 followed by 6 weeks of IV zosyn for kleb, proteus and bacteroides in culture- he completed IV on 09/17/21 and was placed on augmentin following that. [facility] b/l lower extremity swelling and redness of left foot. Blood culture was positive for MRSA 1 of 4 .he was on triple antibiotics- wound culture was positive for MRSA as well He was intermittently confused and found to have co2 retention- had to get BIPAP- he had Left BKA on 10/03/21 As he could not get TEE the plan was to treat him with 4 weeks of IV antibiotic.( 11/01/21) HE was on daptomycin because of CKD- He got IV lasix for volume overload 6/6-6/17. But it had to be resumed because of persistent hypoxia and pulmonary edema Foley cath was placed on 6/11 for urinary retention and flomax was increased, foley  was removed on 6/16 and it looked he was voiding well I had signed off on 10/18/21 . Because of fever a week after removal of cath a urine culture was sent and he was started on IV ceftriaxone- citrobacter grew from culture and was resistant to ceftriaoxne and was changed to cefepime on 10/24/21. Foley cath had tto be replaced for retention . He was transferred to Step down ICU for repsiratory status CXR showed pulmonary edema Because he was on dapto and risk for eosinolphilc pneumonia due to dapto it was discontinued and patient was put on linezolid. Also underwent left pleurocentesis  on 6/28/23and it was a transudate I am asked to come back on his case     Subjective: More alert Wife at bed side Wants to go home  Medications:   amiodarone  200 mg Oral Daily   apixaban  5 mg Oral BID   vitamin C  500 mg Oral Daily   aspirin EC  81 mg Oral Daily   budesonide (PULMICORT) nebulizer solution  0.5 mg Nebulization BID   chlorhexidine  15 mL Mouth Rinse BID   Chlorhexidine Gluconate Cloth  6 each Topical Daily   cholecalciferol  1,000 Units Oral Daily   ezetimibe  10 mg Oral Daily   ferrous sulfate  325 mg Oral Q breakfast   finasteride  5 mg Oral Daily   insulin aspart  0-20 Units Subcutaneous TID WC   insulin aspart  10 Units Subcutaneous TID WC   insulin glargine-yfgn  25 Units Subcutaneous QHS   insulin glargine-yfgn  5 Units Subcutaneous Daily   ipratropium-albuterol  3 mL Nebulization BID   levothyroxine  25 mcg Oral Q0600   linezolid  600 mg Oral Q12H   midodrine  5 mg Oral TID WC   multivitamin with minerals  1 tablet Oral Daily   nystatin   Topical BID   mouth rinse  15 mL Mouth Rinse 4 times per day   primidone  250 mg Oral BID   Ensure Max Protein  11 oz Oral BID   risperiDONE  0.5 mg Oral BID   sodium chloride flush  10-40 mL Intracatheter Q12H   vitamin B-12  1,000 mcg Oral Daily    Objective: Vital signs in last 24 hours: Pulse Rate:  [112-121] 117 (07/03  0923) Resp:  [16-36] 20 (07/03 0923) BP: (100-139)/(56-116) 114/85 (07/03 0700) SpO2:  [75 %-97 %] 95 % (07/03 0923) FiO2 (%):  [30 %] 30 % (07/02 1950)  LDA Foley- 10/24/21   PHYSICAL EXAM:  General:awake and alert Lungs: b/l air entry- decreased bases Crepts Heart: Tachycardia. Abdomen: Soft, non-tender,not distended. Bowel sounds normal. No masses Extremities: left bka site- looks well  Skin: No rashes or lesions. Or bruising Lymph: Cervical, supraclavicular normal. Neurologic: moves all extremities  Lab Results Recent Labs    10/29/21 0455 10/30/21 0338  WBC 7.1 8.9  HGB 8.8* 9.2*  HCT 30.3* 31.3*  NA 139 140  K 4.4 4.6  CL 100 103  CO2 34* 32  BUN 24* 18  CREATININE 0.77 0.73   Liver Panel No results for input(s): "PROT", "ALBUMIN", "AST", "ALT", "ALKPHOS", "BILITOT", "BILIDIR", "IBILI" in the last 72 hours.  Microbiology: 10/21/21 UC citrobacter 6/1 BC- MRSA 1 of 4 10/01/21 left heel ulcer MRSA 10/02/21 BC NG Studies/Results: No results found.  CXR reviewed by me personally Persistent pulmonary edema Assessment/Plan: MRSA bacteremia secondary to left heel ulcer with underlying osteomyelitis. Low bioburden 1 of 4 Repeat blood culture neg Patient was on   Daptomycin S/p BKA on 10/03/21 2 d echo no vegetation- Mitral valve is thickened,no vegetation- unable to do TEE because  of his tenuous resp status Plan was  to give daptomycin for  4 weeks until  11/01/21. Because of worsening resp status and concern that dapto was causing eosinophilia / ?eosinophilic pneumonia it was discontinued last week and  and patient is on  linezolid. until 11/01/21   UTI due to urinary retention- he now has a foley- completed 3 days of cefepime  Acute hypoxic/hypercapnic  resp failure- combination of CHF, hypoventilation syndrome,OSA , co2 retention  r/o amiodarone induced pneumonitis and dapto induced eosinophilic pneumonia Dapto dc on 6/27 CHF- b/l pulm edema and pleural effusion  s/p rt thoracentesis - transudate    AKI   Encephalopathy.  Multifactorial--predominantly due to CO2 narcosis/ . On intermittent BIPAP. Fluctuating  Severe venous edema of both legs  DM- on insulin  Paroxysmal A-fib with atrial flutter.  On amiodarone metoprolol and Eliquis  Coronary artery disease status post CABG   Discussed the management with  his wife and his nurse  ID will sign off- call if needed

## 2021-10-30 NOTE — TOC Progression Note (Addendum)
Transition of Care (TOC) - Progression Note    Patient Details  Name: [name]. MRN: [id number] Date of Birth: [birth year]  Transition of Care [facility]) CM/SW [place], [place] Phone Number: 10/30/2021, 11:06 AM  Clinical Narrative:     Update: CSW met with patient wife at bedside. She was provided with list of LTAC locally, reports she prefers [facility] as this would be easiest for her to get to from her house. Agreeable for Jen with [facility] to reach out to her with more information. Per jen, after conversation with patient's wife she's agreeable for Delsa Sale with [facility] to move forward and start insurance auth. Auth will be started for [facility] on 7/5 due to holiday.      CSW has reached out to [place] with [facility] to review patient for eligibility, pending review at this time.   CSW has updated patient's spouse who reports she will be at the hospital and wants an in person update. CSW requested time she would be here, unable to provide time.   Expected Discharge Plan: [place] Barriers to Discharge: Continued Medical Work up  Expected Discharge Plan and Services Expected Discharge Plan: [place] In-house Referral: NA   Post Acute Care Choice: Resumption of Svcs/PTA Provider Living arrangements for the past 2 months: Single Family Home                                       Social Determinants of Health (SDOH) Interventions    Readmission Risk Interventions    09/29/2021   11:12 AM  Readmission Risk Prevention Plan  Transportation Screening Complete  PCP or Specialist Appt within 3-5 Days Complete  HRI or [name] Complete  Social Work Consult for [place] Planning/Counseling Complete  Palliative Care Screening Not Applicable  Medication Review Press photographer) Complete

## 2021-10-30 NOTE — Progress Notes (Signed)
[facility]  SUBJECTIVE:  - Sleepy, not easily arousable.  - ROS limited as a result.   Vitals:   10/30/21 0400 10/30/21 0500 10/30/21 0600 10/30/21 0700  BP: 121/71  111/78 114/85  Pulse: (!) 120 (!) 117 (!) 118 (!) 117  Resp: (!) '21 16 20 '$ (!) 21  Temp:      TempSrc:      SpO2: (!) 88% (!) 75% 93% (!) 88%  Weight:      Height:         Intake/Output Summary (Last 24 hours) at 10/30/2021 0917 Last data filed at 10/29/2021 1800 Gross per 24 hour  Intake 370 ml  Output 2000 ml  Net -1630 ml       PHYSICAL EXAM  General: Sleeping. Difficult to arouse.  HEENT:  Normocephalic and atramatic Neck:  No JVD.  Lungs: Clear bilaterally to auscultation and percussion. Heart: Tachycardic but regular . Normal S1 and S2 without gallops or murmurs.  Abdomen: Bowel sounds are positive, abdomen soft and non-tender  Msk:  Woody induration of RLE. S/p L BKA Extremities: Left BKA Neuro: Difficult to arouse. Does not participate in exam.  Psych:  Unable to assess.    LABS: Basic Metabolic Panel: Recent Labs    10/29/21 0455 10/30/21 0338  NA 139 140  K 4.4 4.6  CL 100 103  CO2 34* 32  GLUCOSE 149* 61*  BUN 24* 18  CREATININE 0.77 0.73  CALCIUM 8.2* 8.4*  MG 2.1 2.0  PHOS 3.4 3.2    Liver Function Tests: No results for input(s): "AST", "ALT", "ALKPHOS", "BILITOT", "PROT", "ALBUMIN" in the last 72 hours. No results for input(s): "LIPASE", "AMYLASE" in the last 72 hours. CBC: Recent Labs    10/27/21 1033 10/28/21 0543 10/29/21 0455 10/30/21 0338  WBC 7.3   < > 7.1 8.9  NEUTROABS 4.9  --   --   --   HGB 8.8*   < > 8.8* 9.2*  HCT 30.5*   < > 30.3* 31.3*  MCV 94.1   < > 94.1 92.9  PLT 236   < > 240 272   < > = values in this interval not displayed.    Cardiac Enzymes: No results for input(s): "CKTOTAL", "CKMB", "CKMBINDEX", "TROPONINI" in the last 72 hours. BNP: Invalid input(s): "POCBNP" D-Dimer: No results for input(s): "DDIMER" in the last 72  hours. Hemoglobin A1C: No results for input(s): "HGBA1C" in the last 72 hours. Fasting Lipid Panel: No results for input(s): "CHOL", "HDL", "LDLCALC", "TRIG", "CHOLHDL", "LDLDIRECT" in the last 72 hours. Thyroid Function Tests: Recent Labs    10/27/21 1033  TSH 4.833*    Anemia Panel: Recent Labs    10/27/21 1033  VITAMINB12 2,327*     No results found.   Echo LVEF 55-60% 10/05/2021  TELEMETRY: Atrial flutter 116 bpm:  ASSESSMENT AND PLAN:  Principal Problem:   Acute delirium Active Problems:   Essential hypertension, benign   RLS (restless legs syndrome)   Osteomyelitis (HCC)   Obesity, Class III, BMI 40-49.9 (morbid obesity) (HCC)   Acute on chronic diastolic CHF (congestive heart failure) (HCC)   AF (paroxysmal atrial fibrillation) ([place])   Uncontrolled type 2 diabetes mellitus with hypoglycemia, with long-term current use of insulin (HCC)   Malnutrition of moderate degree   Generalized weakness   Acute respiratory failure with hypoxia and hypercapnia (HCC)   OSA (obstructive sleep apnea)   Sepsis due to methicillin resistant Staphylococcus aureus (MRSA) (HCC)   Acute on chronic respiratory  failure with hypoxia and hypercapnia (HCC)   Acute urinary retention   Hyperkalemia   Iron deficiency anemia   UTI (urinary tract infection)    1. Acute on chronic diastolic congestive heart failure, HFpEF, LVEF 55-60% 10/05/21, good diuresis after IV furosemide but remains on supplemental O2 2.  Paroxysmal atrial fibrillation/atrial flutter, currently in atrial flutter heart rate 116 bpm, on amiodarone for rate control and Eliquis for stroke prevention 3.  Coronary artery disease, status post CABG 2006, PCI RCA 01/2020, currently without chest pain 4.  Left foot osteomyelitis on IV linezolid, left BKA 10/03/2021 by Dr. Delana Meyer 5.  Acute kidney injury, improving to Cr 0.77 and >60  6.  MRSA bacteremia, osteomyelitis of L foot likely source, treated with IV daptomycin, repeat  echo ordered by ID, resulted with thickened aortic and mitral leaflets without obvious vegetation.      Recommendations   1.  Agree with current therapy 2.  Continue Eliquis for stroke prevention 3.  Continue amiodarone 200 mg daily for rate control. Currently holding metoprolol and diltiazem d/t recent need for pressors. Will monitor.  4.  Furosemide IV as needed. Does not appear overloaded today.  5.  Given prolonged hospital course with ongoing clinical decline, prognosis seems to be poor. Defer to primary team.    Andrez Grime, MD 10/30/2021 9:17 AM

## 2021-10-30 NOTE — Progress Notes (Signed)
Patient ID: Alec Less., male   DOB: [birth year], 66 y.o.   MRN: [id number]    Progress Note from the Palliative Medicine Team at [facility]   Patient Name: Alec Less.        Date: 10/30/2021 DOB: [name]  Age: 66 y.o. MRN#: [id number] Attending Physician: Alec Dusky, MD Primary Care Physician: Alec Crouch, MD Admit Date: 09/28/2021   Medical records reviewed   66 yo male who presented to [facility] ER on 06/1 via EMS from home with c/o left lower extremity erythema, warmth, and discomfort to the left lower leg.  The pt also had concerns regarding fluid overload due to increased swelling.  He reported he went to the heart failure clinic 2 days prior to ER presentation and was given iv lasix.  He previously has had a partial calcanectomy and has been followed by podiatry and the wound care clinic with wound vac in place.    Upon arrival to the ER MR Left Foot revealed substantially increased osteomyelitis of the calcaneus; partial rupture of the plantar fascia; a large plantar heel ulcer extending to the bony margin of the calcaneus; and diffuse cellulitis along the left heel.  On call podiatrist Dr. Cleda Wood consulted by ER provider.  Pt received ceftriaxone and flagyl.  CXR obtained and revealed pulmonary edema, therefore pt received iv lasix and placed on oxygen.  He was subsequently admitted to the telemetry unit for additional workup and treatment.   Significant Hospital events  06/1: Pt admitted to the [facility] unit with acute on chronic diastolic CHF and left heel osteomyelitis  06/2: Podiatry consulted for left foot osteomyelitis and felt left foot not salvageable and will require higher amputation.  Vascular surgery consulted  06/4: ID consulted for MRSA bacteremia likely source left heel wound  06/6: Pt underwent left BKA per vascular surgery  06/6 to 06/17: Pt with worsening acute on chronic hypoxic respiratory failure secondary to volume overload due to  diastolic CHF.  CXR revealed new pleural effusion.  IV lasix resumed.  Requiring Bipap or [place]  06/21 to 06/27: mentation waxed and waned requiring Bipap during the day due to hypoxia  06/27: Pt requiring 100% HHFNC and transfer to the stepdown unit.  PCCM team consulted to assist with management  6/28: Pt tolerating [place] @ 50L/85%; Underwent Right-sided thoracentesis with removal of 1.2L of pleural fluid Initial palliative consult completed 6/29: Tolerating 45L/70% FiO2 with O2 sats 100%; remains on levophed gtt '@4'  mcg/min to maintain map >65 6/30: altered mental status, but not from hypercapnia  7/1: Pt with intermittent agitation with worsening delirium that started 6/20.  Received prn seroquel and haldol overnight due to sleep deprivation.  This am pt calm with improvement in mentation  7/2: Pt lethargic after receiving prn haldol x1 dose and seroquel x1 dose.  Tolerating Bipap '@30' % FiO2.  If pt remains stable throughout the day will transfer service to [facility] starting 07/3 and PCCM team will sign off   This NP assessed patient at the bedside as a follow up for palliative medicine needs and emotional support.  Patient's granddaughter/[name] and grandson are at the bedside.  Patient anticipates in conversation.  He verbalizes an understanding that he has been in the hospital long time wants to "get well and go home".  I worry that [name] this not have insight into the seriousness of his current medical situation.  This is day 31 of this hospitalization and he continues to require high  oxygen needs.   Later in the afternoon I met with wife at bedside for again conversation regarding current medical situation.  Wife expresses feeling overwhelmed with "everything that is going on".  She understands the seriousness of the medical situation and today acknowledges her concerns that' may be things are not going to turn around".  Education offered today regarding  the importance of continued  conversation with family and the  medical providers regarding overall plan of care and treatment options,  ensuring decisions are within the context of the patients values and GOCs.  Wife tells me that she is the only decision maker and that patient's grandchildren and brother do not weigh in on decisions.  Plan of Care: -Full code    -Educated patient/family to consider DNR/DNI status understanding evidenced based poor outcomes in similar hospitalized patient, as the cause of arrest is likely associated with advanced chronic illness rather than an easily reversible acute cardio-pulmonary event.  -Continue to treat the treatable, open to all offered and available medical interventions to prolong life -Open to continued conversation with the palliative medicine team.  Questions and concerns addressed   Discussed with Dr Jimmye Norman and bedside RN   Alec Lessen NP  Palliative Medicine Team Team Phone # [phone] Pager [phone]

## 2021-10-31 DIAGNOSIS — I480 Paroxysmal atrial fibrillation: Secondary | ICD-10-CM | POA: Diagnosis not present

## 2021-10-31 DIAGNOSIS — R410 Disorientation, unspecified: Secondary | ICD-10-CM | POA: Diagnosis not present

## 2021-10-31 LAB — COMPREHENSIVE METABOLIC PANEL
ALT: 37 U/L (ref 0–44)
AST: 21 U/L (ref 15–41)
Albumin: 2.3 g/dL — ABNORMAL LOW (ref 3.5–5.0)
Alkaline Phosphatase: 297 U/L — ABNORMAL HIGH (ref 38–126)
Anion gap: 4 — ABNORMAL LOW (ref 5–15)
BUN: 15 mg/dL (ref 8–23)
CO2: 31 mmol/L (ref 22–32)
Calcium: 8.4 mg/dL — ABNORMAL LOW (ref 8.9–10.3)
Chloride: 105 mmol/L (ref 98–111)
Creatinine, Ser: 0.7 mg/dL (ref 0.61–1.24)
GFR, Estimated: >60 mL/min (ref >60)
Glucose, Bld: 116 mg/dL — ABNORMAL HIGH (ref 70–99)
Potassium: 4.9 mmol/L (ref 3.5–5.1)
Sodium: 140 mmol/L (ref 135–145)
Total Bilirubin: 0.6 mg/dL (ref 0.3–1.2)
Total Protein: 6.2 g/dL — ABNORMAL LOW (ref 6.5–8.1)

## 2021-10-31 LAB — GLUCOSE, CAPILLARY
Glucose-Capillary: 168 mg/dL — ABNORMAL HIGH (ref 70–99)
Glucose-Capillary: 188 mg/dL — ABNORMAL HIGH (ref 70–99)
Glucose-Capillary: 191 mg/dL — ABNORMAL HIGH (ref 70–99)
Glucose-Capillary: 206 mg/dL — ABNORMAL HIGH (ref 70–99)

## 2021-10-31 LAB — PHOSPHORUS: Phosphorus: 4.1 mg/dL (ref 2.5–4.6)

## 2021-10-31 LAB — MAGNESIUM: Magnesium: 1.9 mg/dL (ref 1.7–2.4)

## 2021-10-31 NOTE — Progress Notes (Signed)
   10/31/21 1500  Clinical Encounter Type  Visited With Patient  Visit Type Initial;Social support  Referral From Nurse  Consult/Referral To [place] responded to consult to provide conversation with patient who enjoyed the time to talk about life, his hobbies, and get his mind off present situation.

## 2021-10-31 NOTE — Progress Notes (Signed)
[facility]  SUBJECTIVE:  - Much more awake and alert today. - No complaints of shortness of breath but still requiring 12 L [place] O2.   Vitals:   10/31/21 0500 10/31/21 0600 10/31/21 0700 10/31/21 0734  BP: 103/74 124/80 128/84   Pulse: (!) 116 (!) 117 (!) 117   Resp: '18 16 20   '$ Temp:      TempSrc:      SpO2: 94% 97% 100% 92%  Weight:      Height:         Intake/Output Summary (Last 24 hours) at 10/31/2021 0842 Last data filed at 10/31/2021 0000 Gross per 24 hour  Intake 590 ml  Output 2700 ml  Net -2110 ml       PHYSICAL EXAM  General: Awake and alert HEENT:  Normocephalic and atramatic Neck:  No JVD.  Lungs: Clear bilaterally to auscultation and percussion. Heart: Tachycardic but regular . Normal S1 and S2 without gallops or murmurs.  Abdomen: Bowel sounds are positive, abdomen soft and non-tender  Msk:  Woody induration of RLE. S/p L BKA Extremities: Left BKA Neuro: No focal deficits.  Psych:  Seems appropriate    LABS: Basic Metabolic Panel: Recent Labs    10/30/21 0338 10/31/21 0523  NA 140 140  K 4.6 4.9  CL 103 105  CO2 32 31  GLUCOSE 61* 116*  BUN 18 15  CREATININE 0.73 0.70  CALCIUM 8.4* 8.4*  MG 2.0 1.9  PHOS 3.2 4.1    Liver Function Tests: Recent Labs    10/30/21 0338 10/31/21 0523  AST 25 21  ALT 46* 37  ALKPHOS 321* 297*  BILITOT 0.5 0.6  PROT 6.5 6.2*  ALBUMIN 2.3* 2.3*   No results for input(s): "LIPASE", "AMYLASE" in the last 72 hours. CBC: Recent Labs    10/29/21 0455 10/30/21 0338  WBC 7.1 8.9  8.9  NEUTROABS  --  5.4  HGB 8.8* 9.2*  9.2*  HCT 30.3* 31.6*  31.3*  MCV 94.1 94.6  92.9  PLT 240 302  272    Cardiac Enzymes: No results for input(s): "CKTOTAL", "CKMB", "CKMBINDEX", "TROPONINI" in the last 72 hours. BNP: Invalid input(s): "POCBNP" D-Dimer: No results for input(s): "DDIMER" in the last 72 hours. Hemoglobin A1C: No results for input(s): "HGBA1C" in the last 72 hours. Fasting Lipid Panel: No  results for input(s): "CHOL", "HDL", "LDLCALC", "TRIG", "CHOLHDL", "LDLDIRECT" in the last 72 hours. Thyroid Function Tests: Recent Labs    10/28/21 1107  T3FREE 2.3    Anemia Panel: No results for input(s): "VITAMINB12", "FOLATE", "FERRITIN", "TIBC", "IRON", "RETICCTPCT" in the last 72 hours.   No results found.   Echo LVEF 55-60% 10/05/2021  TELEMETRY: Atrial flutter 116 bpm:  ASSESSMENT AND PLAN:  Principal Problem:   Acute delirium Active Problems:   Essential hypertension, benign   RLS (restless legs syndrome)   Osteomyelitis (HCC)   Obesity, Class III, BMI 40-49.9 (morbid obesity) (HCC)   Acute on chronic diastolic CHF (congestive heart failure) (HCC)   AF (paroxysmal atrial fibrillation) ([place])   Uncontrolled type 2 diabetes mellitus with hypoglycemia, with long-term current use of insulin (HCC)   Malnutrition of moderate degree   Generalized weakness   Acute respiratory failure with hypoxia and hypercapnia (HCC)   OSA (obstructive sleep apnea)   Sepsis due to methicillin resistant Staphylococcus aureus (MRSA) (HCC)   Acute on chronic respiratory failure with hypoxia and hypercapnia (HCC)   Acute urinary retention   Hyperkalemia   Iron deficiency  anemia   UTI (urinary tract infection)    1. Acute on chronic diastolic congestive heart failure, HFpEF, LVEF 55-60% 10/05/21, good diuresis after IV furosemide but remains on supplemental O2 2.  Paroxysmal atrial fibrillation/atrial flutter, currently in atrial flutter heart rate 116 bpm, on amiodarone for rate control and Eliquis for stroke prevention 3.  Coronary artery disease, status post CABG 2006, PCI RCA 01/2020, currently without chest pain 4.  Left foot osteomyelitis on IV linezolid, left BKA 10/03/2021 by Dr. Delana Meyer 5.  Acute kidney injury, improving to Cr 0.77 and >60  6.  MRSA bacteremia, osteomyelitis of L foot likely source, treated with IV daptomycin, repeat echo ordered by ID, resulted with thickened aortic  and mitral leaflets without obvious vegetation.      Recommendations   1.  Agree with current therapy 2.  Continue Eliquis for stroke prevention 3.  Continue amiodarone 200 mg daily for rate control. Currently holding metoprolol and diltiazem d/t recent need for pressors. Will monitor; currently on midodrine.  4.  Furosemide IV as needed. Does not appear overloaded today.  5.  Given prolonged hospital course with ongoing clinical decline, prognosis seems to be poor. Defer to primary team.    Andrez Grime, MD 10/31/2021 8:42 AM

## 2021-10-31 NOTE — Progress Notes (Signed)
Good day. More alert and talkative. Disappointed that PT did not round today. Talked with wife several times. Wife rude and hateful to Network engineer and patient's nurse. Wife yelled at both people because we would not release information without pass word. After speaking with patient on phone called back and blessed out nurse again about not releasing information without the pass word. Re-explained what HIPPA was and what hospital policy stated. Wife kept on fussing and then hung up. Patient talked with chaplain Lynnae Sandhoff.

## 2021-10-31 NOTE — Progress Notes (Signed)
PROGRESS NOTE   HPI was taken from Dr. Francine Graven: [name]. is a 66 y.o. male with medical history significant for chronic diastolic dysfunction CHF with last known LVEF of 50 to 55%, hypertension, coronary artery disease, depression, COPD, A-fib, morbid obesity (BMI 63), obstructive sleep apnea, status post recent wound debridement and partial calcanectomy (05/16) currently on oral antibiotic therapy after completion of IV who presents to the ER for evaluation of bilateral lower extremity swelling as well as increased redness and swelling involving the left lower extremity mostly over the heel. He denies having any fever or chills but notes increased redness involving his left heel where he has a chronic wound for which he is followed up at the wound clinic. He notes worsening shortness of breath over the last 1 week associated with orthopnea and was recently started on torsemide. He denies having any chest pain, no cough, no fever, no chills, no headache, no nausea, no vomiting, no dizziness, no lightheadedness, no abdominal pain, no urinary symptoms, no changes in his bowel habits, no blurred vision no focal deficit  As per NP Nelson: 06/1: Pt admitted to the [facility] unit with acute on chronic diastolic CHF and left heel osteomyelitis  06/2: Podiatry consulted for left foot osteomyelitis and felt left foot not salvageable and will require higher amputation.  Vascular surgery consulted  06/4: ID consulted for MRSA bacteremia likely source left heel wound  06/6: Pt underwent left BKA per vascular surgery  06/6 to 06/17: Pt with worsening acute on chronic hypoxic respiratory failure secondary to volume overload due to diastolic CHF.  CXR revealed new pleural effusion.  IV lasix resumed.  Requiring Bipap or [place]  06/21 to 06/27: mentation waxed and waned requiring Bipap during the day due to hypoxia  06/27: Pt requiring 100% HHFNC and transfer to the stepdown unit.  PCCM team consulted to  assist with management  6/28: Pt tolerating [place] @ 50L/85%; Underwent Right-sided thoracentesis with removal of 1.2L of pleural fluid 6/29: Tolerating 45L/70% FiO2 with O2 sats 100%; remains on levophed gtt '@4'  mcg/min to maintain map >65 6/30: altered mental status, but not from hypercapnia  7/1: Pt with intermittent agitation with worsening delirium that started 6/20.  Received prn seroquel and haldol overnight due to sleep deprivation.  This am pt calm with improvement in mentation  7/2: Pt lethargic after receiving prn haldol x1 dose and seroquel x1 dose.  Tolerating Bipap '@30' % FiO2.  If pt remains stable throughout the day will transfer service to [facility] starting 07/3 and PCCM team will sign off   As per Dr. Jimmye Norman 7/3-10/31/21: Pt continues on supplemental oxygen and currently on bubbler at 10L. Can be transferred to PCU. Mental status has much improved today. PT/OT will likely need to come re-evaluate but will likely need LTAC vs SNF. CM is working on this.    [facility] [employer].  KJZ:[id number] DOB: 1955/11/13 DOA: 09/28/2021 PCP: [name], MD   Assessment & Plan:   Principal Problem:   Acute delirium Active Problems:   Sepsis due to methicillin resistant Staphylococcus aureus (MRSA) ([name])   Acute on chronic respiratory failure with hypoxia and hypercapnia (HCC)   UTI (urinary tract infection)   Acute on chronic diastolic CHF (congestive heart failure) ([place])   Uncontrolled type 2 diabetes mellitus with hypoglycemia, with long-term current use of insulin (HCC)   AF (paroxysmal atrial fibrillation) (HCC)   Obesity, Class III, BMI 40-49.9 (morbid obesity) ([place])   Essential hypertension, benign  Osteomyelitis (HCC)   RLS (restless legs syndrome)   Acute urinary retention   Malnutrition of moderate degree   Generalized weakness   Acute respiratory failure with hypoxia and hypercapnia (HCC)   OSA (obstructive sleep apnea)   Hyperkalemia   Iron deficiency anemia  Assessment  and Plan: Sepsis: due to methicillin resistant Staphylococcus aureus. See Dr. Marshia Ly notes on how pt met sepsis criteria. Blood cxs growing MRSA. Repeat blood cx NGTD. Likely secondary to osteomyelitis. Continue on linezolid. Poor prognosis. High risk for intubation & cardiac arrest    UTI: urine cx growing citrobacter. Completed abx course    Acute delirium: labile mental status. Continue on risperidone. Seroquel qhs prn     Acute on chronic diastolic CHF: EF 55 to 09% from an echo on 01/23. Holding lasix    DM2: poorly controlled, HbA1c 10.2. Continue on glargine, aspart, SSI w/ accuchecks   PAF: continue on eliquis, amio. Holding metoprolol, cardizem as per cardio. Cardio following and recs apprec. Previously was on pressors but since have been weaned off   Morbid obesity: BMI 41.4. Complicates overall care & prognosis   Osteomyelitis of LLE: s/p L BLKA on 6/6/234. Continue on linezolid until 11/01/21 as per ID    HTN: continue on amidoarone, midodrine. Was previously requiring pressors but have since been weaned off    RLS: holding home dose of mirapex   Acute urinary retention: foley catheter placed on 10/08/2021.  Foley DC'd on 6/16.  Foley needed to be replaced on 10/24/2021   IDA: H&H are labile. No need for a tranfusion currently    Hyperkalemia: resolved   OSA: CPAP qhs. Qualifies for NIV with elevated PCO2 on ABG.   Acute hypoxic & hypercapnic respiratory failure: continue on supplemental oxygen and wean as tolerated. BiPAP prn for CO2 retention    Generalized weakness: PT/OT will have to re-evaluate pt   Malnutrition of moderate degree: related to acute illness (left heel osteomyelitis) and energy intake of [name] than 75% for more than 7 days, mild fat depletion.  Continue on nutritional supplements       DVT prophylaxis: eliquis  Code Status: full  Family Communication:  Disposition Plan: likely d/c to SNF.  Level of care: Stepdown  Status is:  Inpatient Remains inpatient appropriate because: severity of illness   Consultants:  ICU Vascular surg ID   Procedures:    Antimicrobials: linezolid    Subjective: Pt c/o malaise   Objective: Vitals:   10/31/21 0500 10/31/21 0600 10/31/21 0700 10/31/21 0734  BP: 103/74 124/80 128/84   Pulse: (!) 116 (!) 117 (!) 117   Resp: '18 16 20   ' Temp:      TempSrc:      SpO2: 94% 97% 100% 92%  Weight:      Height:        Intake/Output Summary (Last 24 hours) at 10/31/2021 0852 Last data filed at 10/31/2021 0000 Gross per 24 hour  Intake 590 ml  Output 2700 ml  Net -2110 ml   Filed Weights   10/25/21 0500 10/27/21 0500 10/28/21 0500  Weight: (!) 157.8 kg (!) 155.7 kg (!) 154.4 kg    Examination:  General exam: Appears comfortable.  Respiratory system: clear breath sounds b/l  Cardiovascular system: irregularly irregular  Gastrointestinal system: Abd is soft, NT, obese & hypoactive bowel sounds Central nervous system:Awake and alert. Moves extremities  Psychiatry: judgement and insight appears improved. Flat mood and affect    Data Reviewed: I have personally reviewed following  labs and imaging studies  CBC: Recent Labs  Lab 10/26/21 0546 10/27/21 0602 10/27/21 1033 10/28/21 0543 10/29/21 0455 10/30/21 0338  WBC 7.7  7.7 6.9 7.3 7.0 7.1 8.9  8.9  NEUTROABS 5.2  --  4.9  --   --  5.4  HGB 8.7*  8.6* 8.9* 8.8* 8.1* 8.8* 9.2*  9.2*  HCT 30.5*  30.4* 31.1* 30.5* 27.7* 30.3* 31.6*  31.3*  MCV 95.9  95.6 93.4 94.1 93.6 94.1 94.6  92.9  PLT 213  212 217 236 226 240 302  929   Basic Metabolic Panel: Recent Labs  Lab 10/27/21 0602 10/28/21 0543 10/29/21 0455 10/30/21 0338 10/31/21 0523  NA 139 141 139 140 140  K 4.2 4.3 4.4 4.6 4.9  CL 92* 100 100 103 105  CO2 36* 36* 34* 32 31  GLUCOSE 234* 192* 149* 61* 116*  BUN 38* 28* 24* 18 15  CREATININE 0.95 0.84 0.77 0.73 0.70  CALCIUM 8.3* 8.3* 8.2* 8.4* 8.4*  MG 2.4 2.3 2.1 2.0 1.9  PHOS 2.4* 3.0 3.4  3.2 4.1   GFR: Estimated Creatinine Clearance: 146.2 mL/min (by C-G formula based on SCr of 0.7 mg/dL). Liver Function Tests: Recent Labs  Lab 10/26/21 0546 10/30/21 0338 10/31/21 0523  AST  --  25 21  ALT  --  46* 37  ALKPHOS  --  321* 297*  BILITOT  --  0.5 0.6  PROT  --  6.5 6.2*  ALBUMIN 2.2* 2.3* 2.3*   No results for input(s): "LIPASE", "AMYLASE" in the last 168 hours. Recent Labs  Lab 10/27/21 1033  AMMONIA <10   Coagulation Profile: No results for input(s): "INR", "PROTIME" in the last 168 hours. Cardiac Enzymes: No results for input(s): "CKTOTAL", "CKMB", "CKMBINDEX", "TROPONINI" in the last 168 hours. BNP (last 3 results) No results for input(s): "PROBNP" in the last 8760 hours. HbA1C: No results for input(s): "HGBA1C" in the last 72 hours. CBG: Recent Labs  Lab 10/30/21 0740 10/30/21 1138 10/30/21 1614 10/30/21 2147 10/31/21 0806  GLUCAP 83 142* 189* 202* 168*   Lipid Profile: No results for input(s): "CHOL", "HDL", "LDLCALC", "TRIG", "CHOLHDL", "LDLDIRECT" in the last 72 hours. Thyroid Function Tests: Recent Labs    10/28/21 1107  T3FREE 2.3   Anemia Panel: No results for input(s): "VITAMINB12", "FOLATE", "FERRITIN", "TIBC", "IRON", "RETICCTPCT" in the last 72 hours.  Sepsis Labs: Recent Labs  Lab 10/24/21 1806  PROCALCITON 0.27  LATICACIDVEN 0.9    Recent Results (from the past 240 hour(s))  Urine Culture     Status: Abnormal   Collection Time: 10/21/21  4:42 PM   Specimen: Urine, Clean Catch  Result Value Ref Range Status   Specimen Description   Final    URINE, CLEAN CATCH Performed at [facility], [address], [place], [place] [address]    Special Requests   Final    Normal Performed at [facility], [place], [place] [address]    Culture 60,000 COLONIES/mL CITROBACTER FREUNDII (A)  Final   Report Status 10/24/2021 FINAL  Final   Organism ID, Bacteria CITROBACTER FREUNDII (A)  Final       Susceptibility   Citrobacter freundii - MIC*    CEFAZOLIN >=64 RESISTANT Resistant     CEFEPIME 0.5 SENSITIVE Sensitive     CEFTRIAXONE >=64 RESISTANT Resistant     CIPROFLOXACIN <=0.25 SENSITIVE Sensitive     GENTAMICIN <=1 SENSITIVE Sensitive     IMIPENEM <=0.25 SENSITIVE Sensitive  NITROFURANTOIN <=16 SENSITIVE Sensitive     TRIMETH/SULFA <=20 SENSITIVE Sensitive     PIP/TAZO 64 INTERMEDIATE Intermediate     * 60,000 COLONIES/mL CITROBACTER FREUNDII  MRSA Next Gen by PCR, Nasal     Status: None   Collection Time: 10/24/21  6:15 PM   Specimen: Nasal Mucosa; Nasal Swab  Result Value Ref Range Status   MRSA by PCR Next Gen NOT DETECTED NOT DETECTED Final    Comment: (NOTE) The GeneXpert MRSA Assay (FDA approved for NASAL specimens only), is one component of a comprehensive MRSA colonization surveillance program. It is not intended to diagnose MRSA infection nor to guide or monitor treatment for MRSA infections. Test performance is not FDA approved in patients [name] than 67 years old. Performed at [facility], [address] [place], [place] [address]   Body fluid culture w Gram Stain     Status: None   Collection Time: 10/25/21  2:10 PM   Specimen: PATH Cytology Pleural fluid  Result Value Ref Range Status   Specimen Description   Final    PLEURAL Performed at [facility], [address] [place], [place] [address]    Special Requests   Final    NONE Performed at [facility], [address] [place], [place] [address]    Gram Stain   Final    RARE WBC PRESENT, PREDOMINANTLY PMN NO ORGANISMS SEEN    Culture   Final    NO GROWTH 3 DAYS Performed at [facility], [place] [address] [place], [place] [address]    Report Status 10/29/2021 FINAL  Final         Radiology Studies: No results found.      Scheduled Meds:  amiodarone  200 mg Oral Daily   apixaban  5 mg Oral BID   vitamin C  500 mg Oral Daily   aspirin EC  81  mg Oral Daily   budesonide (PULMICORT) nebulizer solution  0.5 mg Nebulization BID   chlorhexidine  15 mL Mouth Rinse BID   Chlorhexidine Gluconate Cloth  6 each Topical Daily   cholecalciferol  1,000 Units Oral Daily   ezetimibe  10 mg Oral Daily   ferrous sulfate  325 mg Oral Q breakfast   finasteride  5 mg Oral Daily   insulin aspart  0-20 Units Subcutaneous TID WC   insulin aspart  10 Units Subcutaneous TID WC   insulin glargine-yfgn  25 Units Subcutaneous QHS   insulin glargine-yfgn  5 Units Subcutaneous Daily   ipratropium-albuterol  3 mL Nebulization BID   levothyroxine  25 mcg Oral Q0600   linezolid  600 mg Oral Q12H   midodrine  5 mg Oral TID WC   multivitamin with minerals  1 tablet Oral Daily   nystatin   Topical BID   primidone  250 mg Oral BID   Ensure Max Protein  11 oz Oral BID   risperiDONE  0.5 mg Oral BID   sodium chloride flush  10-40 mL Intracatheter Q12H   vitamin B-12  1,000 mcg Oral Daily   Continuous Infusions:  sodium chloride Stopped (10/25/21 2209)     LOS: 33 days    Time spent: 25 mins     Wyvonnia Dusky, MD [facility] Pager [phone]  If 7PM-7AM, please contact night-coverage [email or web address] 10/31/2021, 8:52 AM

## 2021-10-31 NOTE — Plan of Care (Signed)
Notes reviewed. In to see patient. Chaplain is currently at bedside.

## 2021-11-01 DIAGNOSIS — Z6841 Body Mass Index (BMI) 40.0 and over, adult: Secondary | ICD-10-CM

## 2021-11-01 DIAGNOSIS — R410 Disorientation, unspecified: Secondary | ICD-10-CM | POA: Diagnosis not present

## 2021-11-01 DIAGNOSIS — R531 Weakness: Secondary | ICD-10-CM | POA: Diagnosis not present

## 2021-11-01 DIAGNOSIS — M869 Osteomyelitis, unspecified: Secondary | ICD-10-CM | POA: Diagnosis not present

## 2021-11-01 DIAGNOSIS — D500 Iron deficiency anemia secondary to blood loss (chronic): Secondary | ICD-10-CM

## 2021-11-01 DIAGNOSIS — E46 Unspecified protein-calorie malnutrition: Secondary | ICD-10-CM

## 2021-11-01 DIAGNOSIS — R339 Retention of urine, unspecified: Secondary | ICD-10-CM

## 2021-11-01 DIAGNOSIS — E875 Hyperkalemia: Secondary | ICD-10-CM

## 2021-11-01 DIAGNOSIS — J9621 Acute and chronic respiratory failure with hypoxia: Secondary | ICD-10-CM | POA: Diagnosis not present

## 2021-11-01 LAB — COMPREHENSIVE METABOLIC PANEL
ALT: 31 U/L (ref 0–44)
AST: 14 U/L — ABNORMAL LOW (ref 15–41)
Albumin: 2.4 g/dL — ABNORMAL LOW (ref 3.5–5.0)
Alkaline Phosphatase: 289 U/L — ABNORMAL HIGH (ref 38–126)
Anion gap: 5 (ref 5–15)
BUN: 21 mg/dL (ref 8–23)
CO2: 30 mmol/L (ref 22–32)
Calcium: 8.6 mg/dL — ABNORMAL LOW (ref 8.9–10.3)
Chloride: 104 mmol/L (ref 98–111)
Creatinine, Ser: 0.88 mg/dL (ref 0.61–1.24)
GFR, Estimated: >60 mL/min (ref >60)
Glucose, Bld: 145 mg/dL — ABNORMAL HIGH (ref 70–99)
Potassium: 5.2 mmol/L — ABNORMAL HIGH (ref 3.5–5.1)
Sodium: 139 mmol/L (ref 135–145)
Total Bilirubin: 0.5 mg/dL (ref 0.3–1.2)
Total Protein: 6.8 g/dL (ref 6.5–8.1)

## 2021-11-01 LAB — CBC
HCT: 32.6 % — ABNORMAL LOW (ref 39.0–52.0)
Hemoglobin: 9.3 g/dL — ABNORMAL LOW (ref 13.0–17.0)
MCH: 27.4 pg (ref 26.0–34.0)
MCHC: 28.5 g/dL — ABNORMAL LOW (ref 30.0–36.0)
MCV: 96.2 fL (ref 80.0–100.0)
Platelets: 305 10*3/uL (ref 150–400)
RBC: 3.39 MIL/uL — ABNORMAL LOW (ref 4.22–5.81)
RDW: 17 % — ABNORMAL HIGH (ref 11.5–15.5)
WBC: 9.1 10*3/uL (ref 4.0–10.5)
nRBC: 0 % (ref 0.0–0.2)

## 2021-11-01 LAB — PHOSPHORUS: Phosphorus: 3.6 mg/dL (ref 2.5–4.6)

## 2021-11-01 LAB — GLUCOSE, CAPILLARY
Glucose-Capillary: 133 mg/dL — ABNORMAL HIGH (ref 70–99)
Glucose-Capillary: 200 mg/dL — ABNORMAL HIGH (ref 70–99)
Glucose-Capillary: 101 mg/dL — ABNORMAL HIGH (ref 70–99)
Glucose-Capillary: 217 mg/dL — ABNORMAL HIGH (ref 70–99)

## 2021-11-01 LAB — MAGNESIUM: Magnesium: 1.9 mg/dL (ref 1.7–2.4)

## 2021-11-01 MED ORDER — MIDODRINE HCL 5 MG PO TABS: 2.5000 mg | ORAL_TABLET | Freq: Two times a day (BID) | ORAL | Status: DC

## 2021-11-01 MED ORDER — METOPROLOL TARTRATE 25 MG PO TABS
25.0000 mg | ORAL_TABLET | Freq: Two times a day (BID) | ORAL | Status: DC
  Administered 2021-11-01 (×2): 25 mg via ORAL
  Filled 2021-11-01 (×2): qty 1

## 2021-11-01 NOTE — Progress Notes (Signed)
Nutrition Follow-up  DOCUMENTATION CODES:   Morbid obesity, Non-severe (moderate) malnutrition in context of acute illness/injury  INTERVENTION:   Ensure Max protein supplement BID, each supplement provides 150kcal and 30g of protein.  MVI po daily   Vitamin C '500mg'$  po daily   Double protein with meals  NUTRITION DIAGNOSIS:   Moderate Malnutrition related to acute illness (L heel osteomyelitis) as evidenced by energy intake < 75% for > 7 days, mild fat depletion.  GOAL:   Patient will meet greater than or equal to 90% of their needs -Progressing   MONITOR:   PO intake, Supplement acceptance, Labs, Weight trends, Skin, I & O's  ASSESSMENT:   66 y/o male with h/o HTN, CHF, Afib, DM, CAD s/p CABG, PAD, OSA, BPH, gout, depression and COPD who is admitted with acute on chronic hypoxic respiratory failure secondary to acute diastolic CHF exacerbation, pneumonia, MRSA bacteremia, L heel osteomyelitis s/p BKA 6/6, UTI and right-sided pleural effusion s/p right sided thoracentesis w/ removal of 1.2L of pleural fluid 10/25/21.  Pt with good appetite and oral intake; pt eating 80-100% of meals and is drinking the Ensure Max protein. Per chart, pt is down 60lbs since admission and is down 29lbs from his UBW. RD suspects ~20lbs of this weight loss if r/t BKA. Pt -17.9L on his I & Os. Recommend continue supplements until wound healing is complete. Plan is for SNF at discharge.   Medications reviewed and include: aspirin, D3, ferrous sulfate, insulin, synthroid, MVI, B12  Labs reviewed: K 5.2(H), P 3.6 wnl, Mg 1.9 wnl Hgb 9.3(L), Hct 32.6(L) Cbgs- 200, 133 x 24 hrs  Diet Order:   Diet Order             Diet Carb Modified Fluid consistency: Thin; Room service appropriate? Yes; Fluid restriction: 1800 mL Fluid  Diet effective now                  EDUCATION NEEDS:   Not appropriate for education at this time  Skin:  Skin Assessment: Skin Integrity Issues: Incisions: s/p lt  BKA Other: venous statsis ulcer to rt pretibial, MASD to abdominal folds and groin, Stage II coccyx  Last BM:  7/4- TYPE 6  Height:   Ht Readings from Last 1 Encounters:  10/16/21 '6\' 4"'$  (1.93 m)    Weight:   Wt Readings from Last 1 Encounters:  10/28/21 (!) 154.4 kg    Ideal Body Weight:  91.8 kg  BMI:  Body mass index is 41.43 kg/m.  Estimated Nutritional Needs:   Kcal:  2700-3000kcal/day  Protein:  >135g/day  Fluid:  2.0L/day  Koleen Distance MS, RD, LDN Please refer to [facility] for RD and/or RD on-call/weekend/after hours pager

## 2021-11-01 NOTE — Progress Notes (Signed)
Patient ID: Alec Less., male   DOB: 1955/06/14, 66 y.o.   MRN: [id number]    Progress Note from the Palliative Medicine Team at [facility]   Patient Name: Alec Less.        Date: 11/01/2021 DOB: Sep 05, 1955  Age: 66 y.o. MRN#: [id number] Attending Physician: Emeterio Reeve, DO Primary Care Physician: [name], MD Admit Date: 09/28/2021   Medical records reviewed   66 yo male who presented to [facility] ER on 06/1 via EMS from home with c/o left lower extremity erythema, warmth, and discomfort to the left lower leg.  The pt also had concerns regarding fluid overload due to increased swelling.  He reported he went to the heart failure clinic 2 days prior to ER presentation and was given iv lasix.  He previously has had a partial calcanectomy and has been followed by podiatry and the wound care clinic with wound vac in place.    Upon arrival to the ER MR Left Foot revealed substantially increased osteomyelitis of the calcaneus; partial rupture of the plantar fascia; a large plantar heel ulcer extending to the bony margin of the calcaneus; and diffuse cellulitis along the left heel.  On call podiatrist Dr. Cleda Mccreedy consulted by ER provider.  Pt received ceftriaxone and flagyl.  CXR obtained and revealed pulmonary edema, therefore pt received iv lasix and placed on oxygen.  He was subsequently admitted to the telemetry unit for additional workup and treatment.   Significant Hospital events  06/1: Pt admitted to the [facility] unit with acute on chronic diastolic CHF and left heel osteomyelitis  06/2: Podiatry consulted for left foot osteomyelitis and felt left foot not salvageable and will require higher amputation.  Vascular surgery consulted  06/4: ID consulted for MRSA bacteremia likely source left heel wound  06/6: Pt underwent left BKA per vascular surgery  06/6 to 06/17: Pt with worsening acute on chronic hypoxic respiratory failure secondary to volume overload due to  diastolic CHF.  CXR revealed new pleural effusion.  IV lasix resumed.  Requiring Bipap or [place]  06/21 to 06/27: mentation waxed and waned requiring Bipap during the day due to hypoxia  06/27: Pt requiring 100% HHFNC and transfer to the stepdown unit.  PCCM team consulted to assist with management  6/28: Pt tolerating [place] @ 50L/85%; Underwent Right-sided thoracentesis with removal of 1.2L of pleural fluid Initial palliative consult completed 6/29: Tolerating 45L/70% FiO2 with O2 sats 100%; remains on levophed gtt '@4'$  mcg/min to maintain map >65 6/30: altered mental status, but not from hypercapnia  7/1: Pt with intermittent agitation with worsening delirium that started 6/20.  Received prn seroquel and haldol overnight due to sleep deprivation.  This am pt calm with improvement in mentation  7/2: Pt lethargic after receiving prn haldol x1 dose and seroquel x1 dose.  Tolerating Bipap '@30'$ % FiO2.  If pt remains stable throughout the day will transfer service to [facility] starting 07/3 and PCCM team will sign off  7/5: Respiratory therapy converted to nasal cannula at 4 L, patient is holding sats  This NP assessed patient at the bedside along with his wife as a follow up for palliative medicine needs and emotional support.  [name] is alert and oriented x3 today  Continued conversation regarding current medical situation.  Again today wife expresses feeling overwhelmed with "everything that is going on".  She is intermittently tearful.  She expresses frustration with patient calling her throughout the day, she expresses her own health  problems and needs to rest.  [name] verbalizes to her that he knows that this is hard on her and the reason he is calling at early morning hours as he does not have o'clock in his room.  Requested unit to have clock placed in room, helping patient with orientation and decreasing risk for delirium.  Created space and opportunity for patient and his wife to explore the  thoughts and feelings regarding current medical situation.  [name] verbalizes his main worry is that "I will never get home" and his wife verbalizes the same.  They both verbalized their love for each other.  Offered strategies to both husband and wife on ways that they can support each other in this difficult, prolonged hospitalization.   Education offered on the importance of mobility, placed order for PT eval and treatment.  Patient is hopeful for possible transition to LTAC in the near future.    Education offered today regarding  the importance of continued conversation with family and the  medical providers regarding overall plan of care and treatment options,  ensuring decisions are within the context of the patients values and GOCs.    Wife tells me that she is the only decision maker and that patient's grandchildren and brother do not weigh in on decisions.  Plan of Care: -Full code    -Educated patient/family to consider DNR/DNI status understanding evidenced based poor outcomes in similar hospitalized patient, as the cause of arrest is likely associated with advanced chronic illness rather than an easily reversible acute cardio-pulmonary event.  -Continue to treat the treatable, open to all offered and available medical interventions to prolong life -Open to continued conversation with the palliative medicine team.  Questions and concerns addressed   Discussed with Dr Sheppard Coil and bedside RN   [name] NP  Palliative Medicine Team Team Phone # [phone] Pager [phone]

## 2021-11-01 NOTE — Progress Notes (Signed)
[facility] Cardiology  SUBJECTIVE:  - Awake and alert today - No complaints of shortness of breath, O2 weaned from 12L to 6L today - BP improved today off midodrine    Vitals:   11/01/21 0800 11/01/21 0811 11/01/21 0900 11/01/21 1000  BP: 123/78  (!) 143/93 126/82  Pulse: (!) 117 (!) 117 (!) 118 (!) 121  Resp: 18 (!) 21 (!) 22 19  Temp:      TempSrc:      SpO2: 92% 95% 91% 92%  Weight:      Height:         Intake/Output Summary (Last 24 hours) at 11/01/2021 1156 Last data filed at 10/31/2021 1700 Gross per 24 hour  Intake 700 ml  Output 500 ml  Net 200 ml       PHYSICAL EXAM  General: Awake and alert HEENT:  Normocephalic and atramatic Neck:  No JVD.  Lungs: Clear bilaterally to auscultation without appreciable crackles or wheezes Heart: Tachycardic but regular . Normal S1 and S2 without gallops or murmurs.  Abdomen: morbidly obese appearing Msk:  Woody induration of RLE, wrapped in kerlix. S/p L BKA Extremities: Left BKA Neuro: No focal deficits.  Psych:  Seems appropriate    LABS: Basic Metabolic Panel: Recent Labs    10/31/21 0523 11/01/21 0452  NA 140 139  K 4.9 5.2*  CL 105 104  CO2 31 30  GLUCOSE 116* 145*  BUN 15 21  CREATININE 0.70 0.88  CALCIUM 8.4* 8.6*  MG 1.9 1.9  PHOS 4.1 3.6    Liver Function Tests: Recent Labs    10/31/21 0523 11/01/21 0452  AST 21 14*  ALT 37 31  ALKPHOS 297* 289*  BILITOT 0.6 0.5  PROT 6.2* 6.8  ALBUMIN 2.3* 2.4*    No results for input(s): "LIPASE", "AMYLASE" in the last 72 hours. CBC: Recent Labs    10/30/21 0338 11/01/21 0452  WBC 8.9  8.9 9.1  NEUTROABS 5.4  --   HGB 9.2*  9.2* 9.3*  HCT 31.6*  31.3* 32.6*  MCV 94.6  92.9 96.2  PLT 302  272 305    Cardiac Enzymes: No results for input(s): "CKTOTAL", "CKMB", "CKMBINDEX", "TROPONINI" in the last 72 hours. BNP: Invalid input(s): "POCBNP" D-Dimer: No results for input(s): "DDIMER" in the last 72 hours. Hemoglobin A1C: No results for input(s):  "HGBA1C" in the last 72 hours. Fasting Lipid Panel: No results for input(s): "CHOL", "HDL", "LDLCALC", "TRIG", "CHOLHDL", "LDLDIRECT" in the last 72 hours. Thyroid Function Tests: No results for input(s): "TSH", "T4TOTAL", "T3FREE", "THYROIDAB" in the last 72 hours.  Invalid input(s): "FREET3"  Anemia Panel: No results for input(s): "VITAMINB12", "FOLATE", "FERRITIN", "TIBC", "IRON", "RETICCTPCT" in the last 72 hours.   No results found.   Echo LVEF 55-60% 10/05/2021  TELEMETRY: Atrial flutter 116 bpm:  ASSESSMENT AND PLAN:  Principal Problem:   Acute delirium Active Problems:   Essential hypertension, benign   RLS (restless legs syndrome)   Osteomyelitis (HCC)   Obesity, Class III, BMI 40-49.9 (morbid obesity) (HCC)   Acute on chronic diastolic CHF (congestive heart failure) (HCC)   AF (paroxysmal atrial fibrillation) ([place])   Uncontrolled type 2 diabetes mellitus with hypoglycemia, with long-term current use of insulin (HCC)   Malnutrition of moderate degree   Generalized weakness   Acute respiratory failure with hypoxia and hypercapnia (HCC)   OSA (obstructive sleep apnea)   Sepsis due to methicillin resistant Staphylococcus aureus (MRSA) (HCC)   Acute on chronic respiratory failure with hypoxia  and hypercapnia ([place])   Acute urinary retention   Hyperkalemia   Iron deficiency anemia   UTI (urinary tract infection)    1. Acute on chronic diastolic congestive heart failure, HFpEF, LVEF 55-60% 10/05/21, good diuresis after IV furosemide but remains on supplemental O2 2.  Paroxysmal atrial fibrillation/atrial flutter, currently in atrial flutter heart rate 116 bpm, on amiodarone for rate control and Eliquis for stroke prevention (home rate control diltiazem '180mg'$  and metoprolol tartrate '100mg'$  BID) 3.  Coronary artery disease, status post CABG 2006, PCI RCA 01/2020, currently without chest pain 4.  Left foot osteomyelitis on IV linezolid, left BKA 10/03/2021 by Dr. Delana Meyer 5.   Acute kidney injury, improving to Cr 0.77 and >60  6.  MRSA bacteremia, osteomyelitis of L foot likely source, treated with IV daptomycin, repeat echo ordered by ID, resulted with thickened aortic and mitral leaflets without obvious vegetation.      Recommendations   1.  Agree with current therapy 2.  Continue Eliquis for stroke prevention 3.  Continue amiodarone 200 mg daily for rate control. Currently holding diltiazem. Last dose of midodrine '5mg'$  was yesterday at 1700, BP remained elevated this morning to peak of 143/93. Will hold further doses of midodrine and restart metoprolol at '25mg'$  BID.  4.  Furosemide IV as needed. Still does not appear volume overloaded today.  5.  Wean oxygen as tolerated 6.  Given prolonged hospital course with ongoing clinical decline, prognosis seems to be poor. Defer to primary team.   This patient's plan of care was discussed and created with Dr. Donnelly Angelica and he is in agreement.    Tristan Schroeder, PA-C  11/01/2021 11:56 AM

## 2021-11-01 NOTE — Progress Notes (Signed)
PROGRESS NOTE    [name].  [name]:[id number] DOB: June 10, 1955  DOA: 09/28/2021 Date of Service: 11/01/21 PCP: [name], MD     Hospital Course:  [name] is a 66 y.o. male with medical history significant for CHF with last known LVEF of 50 to 55%, HTN, CAD, MDD, COPD, A-fib, morbid obesity (BMI 41), OSA, status post recent wound debridement and partial calcanectomy (05/16) on oral antibiotic therapy after completion of IV. He presented to the ER 09/28/2021 for bilateral lower extremity swelling, increased redness and swelling involving the left lower extremity mostly over the heel. Admitted 09/28/2021  06/1: Pt admitted to the [facility] unit with acute on chronic diastolic CHF and left heel osteomyelitis  06/2: Podiatry consulted for left foot osteomyelitis, plan for higher amputation.  Vascular surgery consulted  06/4: ID consulted for MRSA bacteremia likely source left heel wound  06/6: Pt underwent left BKA per vascular surgery  06/6 to 06/17: Pt with worsening acute on chronic hypoxic respiratory failure secondary to volume overload due to diastolic CHF.  CXR revealed new pleural effusion.  IV lasix resumed.  Requiring Bipap or [place]  06/21 to 06/27: mentation waxed and waned requiring Bipap during the day due to hypoxia  06/27: Pt requiring 100% HHFNC and transfer to the stepdown unit.  PCCM team consulted to assist with management  6/28: Pt tolerating [place] @ 50L/85%; Underwent Right-sided thoracentesis with removal of 1.2L of pleural fluid 6/29: Tolerating 45L/70% FiO2 with O2 sats 100%; remains on levophed gtt '@4'$  mcg/min to maintain map >65 6/30: altered mental status, but not from hypercapnia  7/1: Pt with intermittent agitation with worsening delirium that started 6/20.  Received prn seroquel and haldol overnight due to sleep deprivation.  This am pt calm with improvement in mentation  7/2: Pt lethargic after receiving prn haldol x1 dose and seroquel x1  dose.  Tolerating Bipap '@30'$ % FiO2.  If pt remains stable throughout the day will transfer service to [facility] starting 07/3 and PCCM team will sign off  7/3-7/4: supplemental oxygen and currently on bubbler at 10L. Can be transferred to PCU. Mental status has much improved today. PT/OT will likely need to come re-evaluate but will likely need LTAC vs SNF. CM is working on this. 7/5: still requiring 12L O2 this AM but --> 5L later in the day. PT to work w/ him as well.      Consultants:  Podiatry Vascular Surgery Infectious Disease Cardiology Palliative Care   Procedures: 10/03/21: left BKA per vascular surgery    Subjective: Patient reports feeling tired, feeling "bad" and mentioning he has made his wife angry about something. He denies CP/SOB.      ASSESSMENT & PLAN:   Principal Problem:   Acute delirium Active Problems:   Sepsis due to methicillin resistant Staphylococcus aureus (MRSA) (HCC)   Acute on chronic respiratory failure with hypoxia and hypercapnia (HCC)   UTI (urinary tract infection)   Acute on chronic diastolic CHF (congestive heart failure) ([place])   Uncontrolled type 2 diabetes mellitus with hypoglycemia, with long-term current use of insulin (HCC)   AF (paroxysmal atrial fibrillation) (HCC)   Obesity, Class III, BMI 40-49.9 (morbid obesity) ([place])   Essential hypertension, benign   Osteomyelitis (HCC)   RLS (restless legs syndrome)   Acute urinary retention   Malnutrition of moderate degree   Generalized weakness   Acute respiratory failure with hypoxia and hypercapnia (HCC)   OSA (obstructive sleep apnea)   Hyperkalemia  Iron deficiency anemia  Sepsis POA, resolved: due to methicillin resistant Staphylococcus aureus. Blood cxs (+)MRSA. Repeat blood cx NGTD, Echo no vegetations on TTE, unable to to TEE. Likely secondary to osteomyelitis. Continue on linezolid (was on daptomycin but concern for eosinophilia so plan for linezolid until 11/01/21 (last dose will be  later today). Poor prognosis. High risk for intubation & cardiac arrest. See infectious disease notes 07/03: ID signed off    Acute hypoxic/hypercapnic  resp failure- combination of CHF, hypoventilation syndrome,OSA , CO2 retention. Ruled out amiodarone induced pneumonitis and dapto induced eosinophilic pneumonia.   UTI: urine cx (+)citrobacter. Completed abx course 3 days cefepime.     Acute delirium: labile mental status. Continue on risperidone. Seroquel qhs prn     Acute on chronic diastolic CHF: EF 55 to 97% from an echo on 01/23. Cardiology following. Contineu Eliquis and amiodarone, midodrine. Holding BB and CCB d/t recent need for pressors. Lasix IV as needed. Has been well-compensated past few days.    DM2: poorly controlled, HbA1c 10.2. Continue on glargine, aspart, SSI w/ accuchecks   PAF: continue on eliquis, amio. Holding metoprolol, cardizem as per cardio. Cardio following. Previously was on pressors but since have been weaned off   Morbid obesity: BMI 41.4. Complicates overall care & prognosis    Osteomyelitis of LLE: s/p L BLKA. Continue on linezolid until 11/01/21 as per ID    HTN: continue on amidoarone, midodrine. Was previously requiring pressors but have since been weaned off    RLS: holding home dose of mirapex   Acute urinary retention: foley catheter placed on 10/08/2021.  Foley DC'd on 6/16.  Foley needed to be replaced on 10/24/2021   IDA: H&H are labile. No need for a tranfusion currently    Hyperkalemia: resolved then back up slightly today, monitor    OSA: CPAP qhs. Qualifies for NIV with elevated PCO2 on ABG.   Acute hypoxic & hypercapnic respiratory failure: continue on supplemental oxygen and wean as tolerated. BiPAP prn for CO2 retention    Generalized weakness: PT/OT will have to re-evaluate pt   Malnutrition of moderate degree: related to acute illness (left heel osteomyelitis) and energy intake of less than 75% for more than 7 days, mild fat  depletion.  Continue on nutritional supplements           DVT prophylaxis: Eliquis Code Status: FULL Family Communication: wife at bedside on rounds  Disposition Plan / TOC needs: likely will need d/c to SNF/LTAC Barriers to discharge / significant pending items: requiring high supplemental O2, may need LTAC vs SNF              Objective: Vitals:   11/01/21 0300 11/01/21 0323 11/01/21 0400 11/01/21 0500  BP: 128/85  130/82 119/88  Pulse: (!) 117 (!) 117 (!) 112 (!) 116  Resp: (!) 22  (!) 25 (!) 21  Temp:   98.6 F (37 C)   TempSrc:   Oral   SpO2: 97% 97% 97% 96%  Weight:      Height:        Intake/Output Summary (Last 24 hours) at 11/01/2021 0701 Last data filed at 10/31/2021 1700 Gross per 24 hour  Intake 1020 ml  Output 850 ml  Net 170 ml   Filed Weights   10/25/21 0500 10/27/21 0500 10/28/21 0500  Weight: (!) 157.8 kg (!) 155.7 kg (!) 154.4 kg    Examination:  Constitutional:  VS as above General Appearance: alert, well-developed, well-nourished, NAD Eyes: Normal lids  and conjunctive, non-icteric sclera Neck: No masses, trachea midline Respiratory: Normal respiratory effort Breath sounds normal, no wheeze/rhonchi/rales - limited exam pt declined to roll over or sit up  Cardiovascular: S1/S2 normal, no murmur/rub/gallop auscultated No carotid bruit or JVD Pedal pulse II/IV bilaterally DP and PT No lower extremity edema Gastrointestinal: Nontender, no masses but habitus limits exam.  No hepatomegaly, no splenomegaly No hernia appreciated Musculoskeletal:  No clubbing/cyanosis of digits Neurological: No cranial nerve deficit on limited exam Psychiatric: Normal judgment/insight Depressed/flat mood and affect       Scheduled Medications:   amiodarone  200 mg Oral Daily   apixaban  5 mg Oral BID   vitamin C  500 mg Oral Daily   aspirin EC  81 mg Oral Daily   budesonide (PULMICORT) nebulizer solution  0.5 mg Nebulization BID    chlorhexidine  15 mL Mouth Rinse BID   Chlorhexidine Gluconate Cloth  6 each Topical Daily   cholecalciferol  1,000 Units Oral Daily   ezetimibe  10 mg Oral Daily   ferrous sulfate  325 mg Oral Q breakfast   finasteride  5 mg Oral Daily   insulin aspart  0-20 Units Subcutaneous TID WC   insulin aspart  10 Units Subcutaneous TID WC   insulin glargine-yfgn  25 Units Subcutaneous QHS   insulin glargine-yfgn  5 Units Subcutaneous Daily   ipratropium-albuterol  3 mL Nebulization BID   levothyroxine  25 mcg Oral Q0600   linezolid  600 mg Oral Q12H   midodrine  5 mg Oral TID WC   multivitamin with minerals  1 tablet Oral Daily   nystatin   Topical BID   primidone  250 mg Oral BID   Ensure Max Protein  11 oz Oral BID   risperiDONE  0.5 mg Oral BID   sodium chloride flush  10-40 mL Intracatheter Q12H   vitamin B-12  1,000 mcg Oral Daily    Continuous Infusions:  sodium chloride Stopped (10/25/21 2209)    PRN Medications:  acetaminophen **OR** acetaminophen, albuterol, haloperidol lactate, metoprolol tartrate, nitroGLYCERIN, ondansetron **OR** ondansetron (ZOFRAN) IV, mouth rinse, QUEtiapine, sodium chloride flush  Antimicrobials:  Anti-infectives (From admission, onward)    Start     Dose/Rate Route Frequency Ordered Stop   10/26/21 2200  linezolid (ZYVOX) tablet 600 mg        600 mg Oral Every 12 hours 10/26/21 1743 11/01/21 2359   10/24/21 2200  linezolid (ZYVOX) IVPB 600 mg  Status:  Discontinued        600 mg 300 mL/hr over 60 Minutes Intravenous Every 12 hours 10/24/21 2157 10/26/21 1743   10/24/21 1800  ceFEPIme (MAXIPIME) 2 g in sodium chloride 0.9 % 100 mL IVPB        2 g 200 mL/hr over 30 Minutes Intravenous Every 8 hours 10/24/21 1612 10/26/21 1730   10/24/21 1030  sulfamethoxazole-trimethoprim (BACTRIM DS) 800-160 MG per tablet 1 tablet  Status:  Discontinued        1 tablet Oral Every 12 hours 10/24/21 0944 10/24/21 1608   10/23/21 2000  DAPTOmycin (CUBICIN) 800 mg in  sodium chloride 0.9 % IVPB  Status:  Discontinued        6 mg/kg  129.2 kg (Adjusted) 132 mL/hr over 30 Minutes Intravenous Every 24 hours 10/23/21 0745 10/24/21 1803   10/22/21 0945  cefTRIAXone (ROCEPHIN) 1 g in sodium chloride 0.9 % 100 mL IVPB        1 g 200 mL/hr over 30 Minutes  Intravenous Every 24 hours 10/22/21 0848 10/24/21 1845   10/04/21 1000  DAPTOmycin (CUBICIN) 800 mg in sodium chloride 0.9 % IVPB  Status:  Discontinued        6 mg/kg  129.2 kg (Adjusted) 132 mL/hr over 30 Minutes Intravenous Every 24 hours 10/03/21 1648 10/23/21 0745   10/03/21 0000  vancomycin (VANCOREADY) IVPB 1500 mg/300 mL        1,500 mg 150 mL/hr over 120 Minutes Intravenous Every 12 hours 10/02/21 1348 10/03/21 2359   09/30/21 1330  cefTRIAXone (ROCEPHIN) 2 g in sodium chloride 0.9 % 100 mL IVPB  Status:  Discontinued        2 g 200 mL/hr over 30 Minutes Intravenous Daily 09/30/21 1233 10/03/21 1849   09/30/21 1200  vancomycin (VANCOREADY) IVPB 1500 mg/300 mL  Status:  Discontinued        1,500 mg 150 mL/hr over 120 Minutes Intravenous Every 12 hours 09/30/21 0120 09/30/21 0927   09/30/21 1200  vancomycin (VANCOREADY) IVPB 1250 mg/250 mL  Status:  Discontinued        1,250 mg 166.7 mL/hr over 90 Minutes Intravenous Every 12 hours 09/30/21 0927 10/02/21 1348   09/30/21 0000  vancomycin (VANCOREADY) IVPB 1250 mg/250 mL       See Hyperspace for full Linked Orders Report.   1,250 mg 166.7 mL/hr over 90 Minutes Intravenous  Once 09/29/21 2308 09/30/21 0538   09/30/21 0000  vancomycin (VANCOREADY) IVPB 1250 mg/250 mL       See Hyperspace for full Linked Orders Report.   1,250 mg 166.7 mL/hr over 90 Minutes Intravenous  Once 09/29/21 2308 09/30/21 0255   09/29/21 0200  cefTRIAXone (ROCEPHIN) 2 g in sodium chloride 0.9 % 100 mL IVPB  Status:  Discontinued       See Hyperspace for full Linked Orders Report.   2 g 200 mL/hr over 30 Minutes Intravenous Every 24 hours 09/28/21 2051 09/29/21 2309    09/29/21 0200  metroNIDAZOLE (FLAGYL) IVPB 500 mg  Status:  Discontinued       See Hyperspace for full Linked Orders Report.   500 mg 100 mL/hr over 60 Minutes Intravenous Every 12 hours 09/28/21 2051 10/03/21 1849   09/28/21 1830  vancomycin (VANCOCIN) IVPB 1000 mg/200 mL premix        1,000 mg 200 mL/hr over 60 Minutes Intravenous  Once 09/28/21 1826 09/28/21 1937   09/28/21 1745  piperacillin-tazobactam (ZOSYN) IVPB 3.375 g        3.375 g 100 mL/hr over 30 Minutes Intravenous  Once 09/28/21 1736 09/28/21 1838       Data Reviewed: I have personally reviewed following labs and imaging studies  CBC: Recent Labs  Lab 10/26/21 0546 10/27/21 0602 10/27/21 1033 10/28/21 0543 10/29/21 0455 10/30/21 0338 11/01/21 0452  WBC 7.7  7.7   < > 7.3 7.0 7.1 8.9  8.9 9.1  NEUTROABS 5.2  --  4.9  --   --  5.4  --   HGB 8.7*  8.6*   < > 8.8* 8.1* 8.8* 9.2*  9.2* 9.3*  HCT 30.5*  30.4*   < > 30.5* 27.7* 30.3* 31.6*  31.3* 32.6*  MCV 95.9  95.6   < > 94.1 93.6 94.1 94.6  92.9 96.2  PLT 213  212   < > 236 226 240 302  272 305   < > = values in this interval not displayed.   Basic Metabolic Panel: Recent Labs  Lab 10/28/21 0543 10/29/21 0455 10/30/21  8185 10/31/21 0523 11/01/21 0452  NA 141 139 140 140 139  K 4.3 4.4 4.6 4.9 5.2*  CL 100 100 103 105 104  CO2 36* 34* 32 31 30  GLUCOSE 192* 149* 61* 116* 145*  BUN 28* 24* '18 15 21  '$ CREATININE 0.84 0.77 0.73 0.70 0.88  CALCIUM 8.3* 8.2* 8.4* 8.4* 8.6*  MG 2.3 2.1 2.0 1.9 1.9  PHOS 3.0 3.4 3.2 4.1 3.6   GFR: Estimated Creatinine Clearance: 132.9 mL/min (by C-G formula based on SCr of 0.88 mg/dL). Liver Function Tests: Recent Labs  Lab 10/26/21 0546 10/30/21 0338 10/31/21 0523 11/01/21 0452  AST  --  25 21 14*  ALT  --  46* 37 31  ALKPHOS  --  321* 297* 289*  BILITOT  --  0.5 0.6 0.5  PROT  --  6.5 6.2* 6.8  ALBUMIN 2.2* 2.3* 2.3* 2.4*   No results for input(s): "LIPASE", "AMYLASE" in the last 168 hours. Recent  Labs  Lab 10/27/21 1033  AMMONIA <10   Coagulation Profile: No results for input(s): "INR", "PROTIME" in the last 168 hours. Cardiac Enzymes: No results for input(s): "CKTOTAL", "CKMB", "CKMBINDEX", "TROPONINI" in the last 168 hours. BNP (last 3 results) No results for input(s): "PROBNP" in the last 8760 hours. HbA1C: No results for input(s): "HGBA1C" in the last 72 hours. CBG: Recent Labs  Lab 10/30/21 2147 10/31/21 0806 10/31/21 1200 10/31/21 1650 10/31/21 2145  GLUCAP 202* 168* 188* 191* 206*   Lipid Profile: No results for input(s): "CHOL", "HDL", "LDLCALC", "TRIG", "CHOLHDL", "LDLDIRECT" in the last 72 hours. Thyroid Function Tests: No results for input(s): "TSH", "T4TOTAL", "FREET4", "T3FREE", "THYROIDAB" in the last 72 hours. Anemia Panel: No results for input(s): "VITAMINB12", "FOLATE", "FERRITIN", "TIBC", "IRON", "RETICCTPCT" in the last 72 hours. Urine analysis:    Component Value Date/Time   COLORURINE YELLOW (A) 10/21/2021 1318   APPEARANCEUR CLOUDY (A) 10/21/2021 1318   LABSPEC 1.008 10/21/2021 1318   PHURINE 6.0 10/21/2021 1318   GLUCOSEU NEGATIVE 10/21/2021 1318   HGBUR LARGE (A) 10/21/2021 1318   BILIRUBINUR NEGATIVE 10/21/2021 1318   KETONESUR NEGATIVE 10/21/2021 1318   PROTEINUR 100 (A) 10/21/2021 1318   NITRITE NEGATIVE 10/21/2021 1318   LEUKOCYTESUR LARGE (A) 10/21/2021 1318   Sepsis Labs: '@LABRCNTIP'$ (procalcitonin:4,lacticidven:4)  Recent Results (from the past 240 hour(s))  MRSA Next Gen by PCR, Nasal     Status: None   Collection Time: 10/24/21  6:15 PM   Specimen: Nasal Mucosa; Nasal Swab  Result Value Ref Range Status   MRSA by PCR Next Gen NOT DETECTED NOT DETECTED Final    Comment: (NOTE) The GeneXpert MRSA Assay (FDA approved for NASAL specimens only), is one component of a comprehensive MRSA colonization surveillance program. It is not intended to diagnose MRSA infection nor to guide or monitor treatment for MRSA infections. Test  performance is not FDA approved in patients less than 70 years old. Performed at [facility], [address] [place], [place] [address]   Body fluid culture w Gram Stain     Status: None   Collection Time: 10/25/21  2:10 PM   Specimen: PATH Cytology Pleural fluid  Result Value Ref Range Status   Specimen Description   Final    PLEURAL Performed at [facility], [address], [place], [place] [address]    Special Requests   Final    NONE Performed at [facility], [address] [place], [place] [address]    Gram Stain  Final    RARE WBC PRESENT, PREDOMINANTLY PMN NO ORGANISMS SEEN    Culture   Final    NO GROWTH 3 DAYS Performed at [facility] [address] [place], [place] [address]    Report Status 10/29/2021 FINAL  Final         Radiology Studies last 96 hours: No results found.          LOS: 34 days    Time spent: 50 min    Emeterio Reeve, DO [facility] 11/01/2021, 7:01 AM   Staff may message me via secure chat in [place]  but this may not receive immediate response,  please page for urgent matters!  If 7PM-7AM, please contact night-coverage [email or web address]  Dictation software was used to generate the above note. Typos may occur and escape review, as with typed/written notes. Please contact Dr Sheppard Coil directly for clarity if needed.

## 2021-11-01 NOTE — TOC Progression Note (Signed)
Transition of Care (TOC) - Progression Note    Patient Details  Name: [name]. MRN: [id number] Date of Birth: 1955/09/02  Transition of Care [facility]) CM/SW [place], [place] Phone Number: 11/01/2021, 4:01 PM  Clinical Narrative:     CSW confirmed with Delsa Sale with [facility] that insurance Josem Kaufmann was started today.  Expected Discharge Plan: [place] Barriers to Discharge: Continued Medical Work up  Expected Discharge Plan and Services Expected Discharge Plan: [place] In-house Referral: NA   Post Acute Care Choice: Resumption of Svcs/PTA Provider Living arrangements for the past 2 months: Single Family Home                                       Social Determinants of Health (SDOH) Interventions    Readmission Risk Interventions    09/29/2021   11:12 AM  Readmission Risk Prevention Plan  Transportation Screening Complete  PCP or Specialist Appt within 3-5 Days Complete  HRI or [place] Complete  Social Work Consult for [place] Planning/Counseling Complete  Palliative Care Screening Not Applicable  Medication Review Press photographer) Complete

## 2021-11-01 NOTE — Evaluation (Addendum)
Physical Therapy Re-Evaluation Patient Details Name: [name]. MRN: [id number] DOB: 12/26/1955 Today's Date: 11/01/2021  History of Present Illness  [name] is a 39yoM who comes to [facility] on 09/28/21 c progression of Left chornic heel ulcer. MRI showed significant progressive osteomyelitis in the left calcaneus. S/p L BKA 6/6. PT signed off due to pt transition to higher level of care due to increased demands for supplemental oxygen and high risk for deterioration and intubation.PMH HTN, IDDM2, morbid obesity, depression, CAD s/p CABG 2006 and PCI to RCA in 12/2020, chronic B venous stasis on Unna boots, chronic L foot ulcer, COPD, OSA on CPAP nightly, ai-fib, PAD, s/p multiple toe amputations.   Clinical Impression  Pt cleared by MD to participate with therapy services with elevated K+ (5.2). Patient alert, oriented to self, place, month/date (not year), family in room, and president. Seen as a PT re-evaluation, and goals updated as needed. The patient with flat affect throughout session and spoke minimally to PT but followed all one step commands. On 4L throughout session spO2 ranging from 89-93%. Session focused on upright tolerance and exercises; placed in chair position at start of session and propped with pillows to encourage upright. He was able to perform several BLE exercises, AAROM as needed (hip abduction/adduction on RLE, AAROM for all except SAQ for LLE). Pt also able to perform several BUE exercises, including pulling on bed rails for bed sit up, minA to clear back from bed.  Overall the patient demonstrated deficits (see "PT Problem List") that impede the patient's functional abilities, safety, and mobility and would benefit from skilled PT intervention. Recommendation at this time is SNF to maximize function and safety and improve independence as able.      Recommendations for follow up therapy are one component of a multi-disciplinary discharge planning process, led by the  attending physician.  Recommendations may be updated based on patient status, additional functional criteria and insurance authorization.  Follow Up Recommendations Skilled nursing-short term rehab (<3 hours/day) Can patient physically be transported by private vehicle: No    Assistance Recommended at Discharge Frequent or constant Supervision/Assistance  Patient can return home with the following  Two people to help with bathing/dressing/bathroom;Two people to help with walking and/or transfers;Assistance with cooking/housework;Assist for transportation;Help with stairs or ramp for entrance;Direct supervision/assist for medications management;Direct supervision/assist for financial management    Equipment Recommendations Other (comment) (TBD at next venue of care)  Recommendations for Other Services       Functional Status Assessment Patient has had a recent decline in their functional status and/or demonstrates limited ability to make significant improvements in function in a reasonable and predictable amount of time     Precautions / Restrictions Precautions Precautions: Fall Precaution Comments: Contact precautions Restrictions Weight Bearing Restrictions: No      Mobility  Bed Mobility               General bed mobility comments: deferred today    Transfers                   General transfer comment: deferred today    Ambulation/Gait                  Stairs            Wheelchair Mobility    Modified Rankin (Stroke Patients Only)       Balance  Pertinent Vitals/Pain Pain Assessment Pain Assessment: Faces Faces Pain Scale: Hurts a little bit Pain Location: reported no pain but then reference LBP at end of session Pain Descriptors / Indicators: Grimacing Pain Intervention(s): Limited activity within patient's tolerance, Monitored during session, Repositioned    Home  Living Family/patient expects to be discharged to:: Private residence Living Arrangements: Spouse/significant other Available Help at Discharge: Family;Available PRN/intermittently Type of Home: House Home Access: Stairs to enter Entrance Stairs-Rails: Can reach both;Left;Right Entrance Stairs-Number of Steps: 4   Home Layout: One level Home Equipment: Conservation officer, nature (2 wheels);Cane - quad;Shower seat - built in      Prior Function Prior Level of Function : Independent/Modified Independent             Mobility Comments: Pt reports he has been ambulating with RW or using knee scooter (mostly in community) ADLs Comments: wife assists for LB dressing. He has been washing at sink to keep LE dry prior to admission. Wife assists with IADLs     Hand Dominance   Dominant Hand: Right    Extremity/Trunk Assessment   Upper Extremity Assessment Upper Extremity Assessment: Generalized weakness    Lower Extremity Assessment Lower Extremity Assessment: Generalized weakness       Communication   Communication: No difficulties  Cognition Arousal/Alertness: Awake/alert Behavior During Therapy: WFL for tasks assessed/performed Overall Cognitive Status: Impaired/Different from baseline Area of Impairment: Safety/judgement, Awareness, Problem solving                   Current Attention Level: Sustained           General Comments: pt oriented to place, self, president, and wife in the room        General Comments      Exercises Other Exercises Other Exercises: SAQ, heel slides, hip abd/adduction, SLR bilaterally, AAROM as needed (AAROM for L knee flexion and complete SLR)   Assessment/Plan    PT Assessment Patient needs continued PT services  PT Problem List Decreased strength;Decreased activity tolerance;Decreased balance;Decreased mobility;Decreased cognition;Decreased knowledge of use of DME;Decreased safety awareness;Cardiopulmonary status limiting activity        PT Treatment Interventions DME instruction;Balance training;Gait training;Neuromuscular re-education;Stair training;Cognitive remediation;Functional mobility training;Patient/family education;Therapeutic activities;Wheelchair mobility training;Therapeutic exercise;Manual techniques;Modalities;Other (comment)    PT Goals (Current goals can be found in the Care Plan section)  Acute Rehab PT Goals Patient Stated Goal: to get better PT Goal Formulation: With patient Time For Goal Achievement: 11/15/21 Potential to Achieve Goals: Fair    Frequency Min 2X/week     Co-evaluation               AM-PAC PT "6 Clicks" Mobility  Outcome Measure Help needed turning from your back to your side while in a flat bed without using bedrails?: A Lot Help needed moving from lying on your back to sitting on the side of a flat bed without using bedrails?: A Lot Help needed moving to and from a bed to a chair (including a wheelchair)?: Total Help needed standing up from a chair using your arms (e.g., wheelchair or bedside chair)?: Total Help needed to walk in hospital room?: Total Help needed climbing 3-5 steps with a railing? : Total 6 Click Score: 8    End of Session Equipment Utilized During Treatment: Oxygen (4L) Activity Tolerance: Patient tolerated treatment well Patient left: in bed;with call bell/phone within reach;with bed alarm set;with family/visitor present Nurse Communication: Mobility status PT Visit Diagnosis: Difficulty in walking, not elsewhere classified (R26.2);Other abnormalities  of gait and mobility (R26.89)    Time: 3545-6256 PT Time Calculation (min) (ACUTE ONLY): 23 min   Charges:   PT Evaluation $PT Re-evaluation: 1 Re-eval PT Treatments $Therapeutic Activity: 23-37 mins       Lieutenant Diego PT, DPT 3:10 PM,11/01/21

## 2021-11-02 ENCOUNTER — Ambulatory Visit: Payer: Medicare Other | Admitting: Infectious Diseases

## 2021-11-02 ENCOUNTER — Encounter: Payer: Self-pay | Admitting: Vascular Surgery

## 2021-11-02 DIAGNOSIS — R531 Weakness: Secondary | ICD-10-CM | POA: Diagnosis not present

## 2021-11-02 DIAGNOSIS — J9621 Acute and chronic respiratory failure with hypoxia: Secondary | ICD-10-CM | POA: Diagnosis not present

## 2021-11-02 DIAGNOSIS — R410 Disorientation, unspecified: Secondary | ICD-10-CM | POA: Diagnosis not present

## 2021-11-02 DIAGNOSIS — M869 Osteomyelitis, unspecified: Secondary | ICD-10-CM | POA: Diagnosis not present

## 2021-11-02 LAB — BASIC METABOLIC PANEL
Anion gap: 5 (ref 5–15)
BUN: 23 mg/dL (ref 8–23)
CO2: 30 mmol/L (ref 22–32)
Calcium: 8.4 mg/dL — ABNORMAL LOW (ref 8.9–10.3)
Chloride: 101 mmol/L (ref 98–111)
Creatinine, Ser: 0.71 mg/dL (ref 0.61–1.24)
GFR, Estimated: >60 mL/min (ref >60)
Glucose, Bld: 156 mg/dL — ABNORMAL HIGH (ref 70–99)
Potassium: 5.2 mmol/L — ABNORMAL HIGH (ref 3.5–5.1)
Sodium: 136 mmol/L (ref 135–145)

## 2021-11-02 LAB — GLUCOSE, CAPILLARY
Glucose-Capillary: 195 mg/dL — ABNORMAL HIGH (ref 70–99)
Glucose-Capillary: 189 mg/dL — ABNORMAL HIGH (ref 70–99)
Glucose-Capillary: 135 mg/dL — ABNORMAL HIGH (ref 70–99)
Glucose-Capillary: 124 mg/dL — ABNORMAL HIGH (ref 70–99)

## 2021-11-02 LAB — CBC
HCT: 33.3 % — ABNORMAL LOW (ref 39.0–52.0)
Hemoglobin: 9.4 g/dL — ABNORMAL LOW (ref 13.0–17.0)
MCH: 27.1 pg (ref 26.0–34.0)
MCHC: 28.2 g/dL — ABNORMAL LOW (ref 30.0–36.0)
MCV: 96 fL (ref 80.0–100.0)
Platelets: 333 10*3/uL (ref 150–400)
RBC: 3.47 MIL/uL — ABNORMAL LOW (ref 4.22–5.81)
RDW: 16.9 % — ABNORMAL HIGH (ref 11.5–15.5)
WBC: 8.9 10*3/uL (ref 4.0–10.5)
nRBC: 0 % (ref 0.0–0.2)

## 2021-11-02 LAB — MAGNESIUM: Magnesium: 1.8 mg/dL (ref 1.7–2.4)

## 2021-11-02 LAB — PHOSPHORUS: Phosphorus: 3.7 mg/dL (ref 2.5–4.6)

## 2021-11-02 MED ORDER — ORAL CARE MOUTH RINSE: 15.0000 mL | OROMUCOSAL | Status: DC | PRN

## 2021-11-02 MED ORDER — HALOPERIDOL LACTATE 5 MG/ML IJ SOLN: 2.5000 mg | Freq: Once | INTRAMUSCULAR | Status: DC

## 2021-11-02 MED ORDER — METOPROLOL TARTRATE 50 MG PO TABS
100.0000 mg | ORAL_TABLET | Freq: Two times a day (BID) | ORAL | Status: DC
Start: 2021-11-02 — End: 2021-11-03
  Administered 2021-11-02 – 2021-11-03 (×2): 100 mg via ORAL
  Filled 2021-11-02 (×2): qty 2

## 2021-11-02 MED ORDER — METOPROLOL TARTRATE 50 MG PO TABS
50.0000 mg | ORAL_TABLET | Freq: Two times a day (BID) | ORAL | Status: DC
  Administered 2021-11-02: 50 mg via ORAL
  Filled 2021-11-02: qty 1

## 2021-11-02 NOTE — Progress Notes (Signed)
[facility] Cardiology  SUBJECTIVE:  - Awake and alert, wants to go home - agitated overnight - No complaints of shortness of breath, remains on 6L today - BP remained elevated off midodrine  Vitals:   11/02/21 0500 11/02/21 0600 11/02/21 0700 11/02/21 0830  BP: 137/87 130/82 (!) 152/90   Pulse: (!) 115 (!) 113 (!) 114 (!) 115  Resp: (!) 23 13 (!) 25 (!) 21  Temp:      TempSrc:      SpO2: 91% 92% 93% 98%  Weight:      Height:         Intake/Output Summary (Last 24 hours) at 11/02/2021 0915 Last data filed at 11/02/2021 0220 Gross per 24 hour  Intake --  Output 1900 ml  Net -1900 ml     PHYSICAL EXAM  General: Awake and alert HEENT:  Normocephalic and atramatic Neck:  No JVD.  Lungs: Clear bilaterally to auscultation without appreciable crackles or wheezes Heart: Tachycardic but regular . Normal S1 and S2 without gallops or murmurs.  Abdomen: morbidly obese appearing Msk:  Woody induration of RLE, wrapped in kerlix. S/p L BKA Extremities: Left BKA Neuro: No focal deficits.  Psych:  Seems appropriate    LABS: Basic Metabolic Panel: Recent Labs    11/01/21 0452 11/02/21 0400  NA 139 136  K 5.2* 5.2*  CL 104 101  CO2 30 30  GLUCOSE 145* 156*  BUN 21 23  CREATININE 0.88 0.71  CALCIUM 8.6* 8.4*  MG 1.9 1.8  PHOS 3.6 3.7    Liver Function Tests: Recent Labs    10/31/21 0523 11/01/21 0452  AST 21 14*  ALT 37 31  ALKPHOS 297* 289*  BILITOT 0.6 0.5  PROT 6.2* 6.8  ALBUMIN 2.3* 2.4*    No results for input(s): "LIPASE", "AMYLASE" in the last 72 hours. CBC: Recent Labs    11/01/21 0452 11/02/21 0400  WBC 9.1 8.9  HGB 9.3* 9.4*  HCT 32.6* 33.3*  MCV 96.2 96.0  PLT 305 333    Cardiac Enzymes: No results for input(s): "CKTOTAL", "CKMB", "CKMBINDEX", "TROPONINI" in the last 72 hours. BNP: Invalid input(s): "POCBNP" D-Dimer: No results for input(s): "DDIMER" in the last 72 hours. Hemoglobin A1C: No results for input(s): "HGBA1C" in the last 72  hours. Fasting Lipid Panel: No results for input(s): "CHOL", "HDL", "LDLCALC", "TRIG", "CHOLHDL", "LDLDIRECT" in the last 72 hours. Thyroid Function Tests: No results for input(s): "TSH", "T4TOTAL", "T3FREE", "THYROIDAB" in the last 72 hours.  Invalid input(s): "FREET3"  Anemia Panel: No results for input(s): "VITAMINB12", "FOLATE", "FERRITIN", "TIBC", "IRON", "RETICCTPCT" in the last 72 hours.   No results found.   Echo LVEF 55-60% 10/05/2021  TELEMETRY: Atrial flutter 116 bpm:  ASSESSMENT AND PLAN:  Principal Problem:   Acute delirium Active Problems:   Essential hypertension, benign   RLS (restless legs syndrome)   Osteomyelitis (HCC)   Obesity, Class III, BMI 40-49.9 (morbid obesity) (HCC)   Acute on chronic diastolic CHF (congestive heart failure) (HCC)   AF (paroxysmal atrial fibrillation) ([place])   Uncontrolled type 2 diabetes mellitus with hypoglycemia, with long-term current use of insulin (HCC)   Malnutrition of moderate degree   Generalized weakness   Acute respiratory failure with hypoxia and hypercapnia (HCC)   OSA (obstructive sleep apnea)   Sepsis due to methicillin resistant Staphylococcus aureus (MRSA) (HCC)   Acute on chronic respiratory failure with hypoxia and hypercapnia (HCC)   Acute urinary retention   Hyperkalemia   Iron deficiency anemia  UTI (urinary tract infection)    1. Acute on chronic diastolic congestive heart failure, HFpEF, LVEF 55-60% 10/05/21, good diuresis after IV furosemide but remains on supplemental O2 2.  Paroxysmal atrial fibrillation/atrial flutter, currently in atrial flutter heart rate 116 bpm, on amiodarone for rate control and Eliquis for stroke prevention (home rate control diltiazem '180mg'$  and metoprolol tartrate '100mg'$  BID) 3.  Coronary artery disease, status post CABG 2006, PCI RCA 01/2020, currently without chest pain 4.  Left foot osteomyelitis on IV linezolid, left BKA 10/03/2021 by Dr. Delana Meyer 5.  Acute kidney injury,  improving to Cr 0.77 and >60  6.  MRSA bacteremia, osteomyelitis of L foot likely source, treated with IV daptomycin, repeat echo ordered by ID, resulted with thickened aortic and mitral leaflets without obvious vegetation.      Recommendations   1.  Agree with current therapy 2.  Continue Eliquis for stroke prevention 3.  Continue amiodarone 200 mg daily for rate control. Currently holding diltiazem.  - discontinue midodrine - increase metoprolol to '50mg'$  BID 4.  Furosemide IV as needed. Still does not appear volume overloaded today.  5.  Wean oxygen as tolerated 6.  Given prolonged hospital course with ongoing clinical decline, prognosis seems to be poor. Defer to primary team.   -pending rehab vs LTAC placement   This patient's plan of care was discussed and created with Dr. Donnelly Angelica and he is in agreement.    Alec Schroeder, PA-C  11/02/2021 9:15 AM

## 2021-11-02 NOTE — Progress Notes (Signed)
Patient becoming irritable, stating that he would like to leave. Patient wishes to get out of the bed and walk out of the hospital. He does not believe staff when they tell him his current length of stay and the current time, stating that it is bright outside of his room (due to the lights). He is appears to be forgetful and is difficult to redirect. One mg haldol given with relief. He is resting comfortably at this time and all vitals are WNL.  Cameron Ali, RN

## 2021-11-02 NOTE — Progress Notes (Addendum)
PROGRESS NOTE    Korbyn Lennox Solders.  UUV:[id number] DOB: Jun 08, 1955  DOA: 09/28/2021 Date of Service: 11/02/21 PCP: [name], MD     Hospital Course:  [name] is a 66 y.o. male with medical history significant for CHF with last known LVEF of 50 to 55%, HTN, CAD, MDD, COPD, A-fib, morbid obesity (BMI 70), OSA, status post recent wound debridement and partial calcanectomy (05/16) on oral antibiotic therapy after completion of IV. He presented to the ER 09/28/2021 for bilateral lower extremity swelling, increased redness and swelling involving the left lower extremity mostly over the heel. Admitted 09/28/2021  06/1: Pt admitted to the [facility] unit with acute on chronic diastolic CHF and left heel osteomyelitis  06/2: Podiatry consulted for left foot osteomyelitis, plan for higher amputation.  Vascular surgery consulted  06/4: ID consulted for MRSA bacteremia likely source left heel wound  06/6: Pt underwent left BKA per vascular surgery  06/6 to 06/17: Pt with worsening acute on chronic hypoxic respiratory failure secondary to volume overload due to diastolic CHF.  CXR revealed new pleural effusion.  IV lasix resumed.  Requiring Bipap or [place]  06/21 to 06/27: mentation waxed and waned requiring Bipap during the day due to hypoxia  06/27: Pt requiring 100% HHFNC and transfer to the stepdown unit.  PCCM team consulted to assist with management  6/28: Pt tolerating [place] @ 50L/85%; Underwent Right-sided thoracentesis with removal of 1.2L of pleural fluid 6/29: Tolerating 45L/70% FiO2 with O2 sats 100%; remains on levophed gtt '@4'$  mcg/min to maintain map >65 6/30: altered mental status, but not from hypercapnia  7/1: Pt with intermittent agitation with worsening delirium that started 6/20.  Received prn seroquel and haldol overnight due to sleep deprivation.  This am pt calm with improvement in mentation  7/2: Pt lethargic after receiving prn haldol x1 dose and seroquel x1  dose.  Tolerating Bipap '@30'$ % FiO2.  If pt remains stable throughout the day will transfer service to [facility] starting 07/3 and PCCM team will sign off  7/3-7/4: supplemental oxygen and currently on bubbler at 10L. Can be transferred to PCU. Mental status has much improved today. PT/OT will likely need to come re-evaluate but will likely need LTAC vs SNF. CM is working on this. 7/5: still requiring 12L O2 this AM but --> 5L later in the day. 7/6: down to 4L  in the day, still on BiPap qhs/when asleep. PT/OT recommending SNF/LTAC, TOC working on LTAC but given O2 requirement have improved he may be able to go to SNF, TOC will look into this. Transfer to progressive given BiPap needs. Increased beta blocker given tachycardia. Cardiology also following.       Consultants:  Podiatry Vascular Surgery Infectious Disease Cardiology Palliative Care   Procedures: 10/03/21: left BKA per vascular surgery    Subjective: Pt resting, rousable but not participating w/ interview, he is on BiPap while napping.      ASSESSMENT & PLAN:   Principal Problem:   Acute delirium Active Problems:   Sepsis due to methicillin resistant Staphylococcus aureus (MRSA) (HCC)   Acute on chronic respiratory failure with hypoxia and hypercapnia (HCC)   UTI (urinary tract infection)   Acute on chronic diastolic CHF (congestive heart failure) ([place])   Uncontrolled type 2 diabetes mellitus with hypoglycemia, with long-term current use of insulin (HCC)   AF (paroxysmal atrial fibrillation) (HCC)   Obesity, Class III, BMI 40-49.9 (morbid obesity) ([place])   Essential hypertension, benign   Osteomyelitis (  HCC)   RLS (restless legs syndrome)   Acute urinary retention   Malnutrition of moderate degree   Generalized weakness   Acute respiratory failure with hypoxia and hypercapnia (HCC)   OSA (obstructive sleep apnea)   Hyperkalemia   Iron deficiency anemia  Sepsis POA, resolved: due to methicillin resistant  Staphylococcus aureus. Blood cxs (+)MRSA. Repeat blood cx NGTD, Echo no vegetations on TTE, unable to to TEE. Likely secondary to osteomyelitis. Continue on linezolid (was on daptomycin but concern for eosinophilia so plan for linezolid until 11/01/21. Poor prognosis. High risk for intubation & cardiac arrest. See infectious disease notes 07/03: ID signed off    Acute hypoxic/hypercapnic  resp failure- combination of CHF, hypoventilation syndrome,OSA , CO2 retention. Ruled out amiodarone induced pneumonitis and dapto induced eosinophilic pneumonia.  BiPAP qhs / while asleep   UTI: urine cx (+)citrobacter. Completed abx course 3 days cefepime.     Acute delirium: labile mental status. Continue on risperidone. Seroquel qhs prn     Acute on chronic diastolic CHF: EF 55 to 50% from an echo on 01/23. Cardiology following. Contineu Eliquis and amiodarone, midodrine. Holding BB and CCB d/t recent need for pressors. Lasix IV as needed. Has been well-compensated past few days.    DM2: poorly controlled, HbA1c 10.2. Continue on glargine, aspart, SSI w/ accuchecks   PAF: continue on eliquis, amio. Restarting metoprolol given tachycardia and BP has been better. Cardio following. Previously was on pressors but since have been weaned off   Morbid obesity: BMI 41.4. Complicates overall care & prognosis    Osteomyelitis of LLE: s/p L BLKA. completed linezolid 11/01/21 as per ID    HTN: continue on amidoarone, midodrine. Was previously requiring pressors but have since been weaned off, can hopefully wean off midodrine soon   RLS: holding home dose of mirapex   Acute urinary retention: foley catheter placed on 10/08/2021.  Foley DC'd on 6/16.  Foley needed to be replaced on 10/24/2021   IDA: H&H are labile. No need for a tranfusion currently    Hyperkalemia: resolved then back up slightly today, monitor    OSA: CPAP qhs. Qualifies for NIV with elevated PCO2 on ABG.   Acute hypoxic & hypercapnic respiratory  failure: continue on supplemental oxygen and wean as tolerated. BiPAP prn for CO2 retention    Generalized weakness: PT/OT will have to re-evaluate pt   Malnutrition of moderate degree: related to acute illness (left heel osteomyelitis) and energy intake of less than 75% for more than 7 days, mild fat depletion.  Continue on nutritional supplements           DVT prophylaxis: Eliquis Code Status: FULL Family Communication: will call wife later today   Disposition Plan / TOC needs: likely will need d/c to SNF/LTAC Barriers to discharge / significant pending items: requiring high supplemental O2, may need LTAC vs SNF              Objective: Vitals:   11/02/21 0500 11/02/21 0600 11/02/21 0700 11/02/21 0830  BP: 137/87 130/82 (!) 152/90   Pulse: (!) 115 (!) 113 (!) 114 (!) 115  Resp: (!) 23 13 (!) 25 (!) 21  Temp:      TempSrc:      SpO2: 91% 92% 93% 98%  Weight:      Height:        Intake/Output Summary (Last 24 hours) at 11/02/2021 0934 Last data filed at 11/02/2021 0220 Gross per 24 hour  Intake --  Output 1900  ml  Net -1900 ml    Filed Weights   10/25/21 0500 10/27/21 0500 10/28/21 0500  Weight: (!) 157.8 kg (!) 155.7 kg (!) 154.4 kg    Examination:  Constitutional:  VS as above General Appearance: alert, well-developed, well-nourished, NAD Eyes: Normal lids and conjunctive, non-icteric sclera Neck: No masses, trachea midline Respiratory: Normal respiratory effort Breath sounds normal, no wheeze/rhonchi/rales - limited exam pt declined to roll over or sit up  Cardiovascular: S1/S2 normal, no murmur/rub/gallop auscultated Trace lower extremity edema Gastrointestinal: Nontender, no masses but habitus limits exam.  Musculoskeletal:  No clubbing/cyanosis of digits Neurological: No cranial nerve deficit on limited exam Psychiatric: Normal judgment/insight Depressed/flat mood and affect       Scheduled Medications:   amiodarone  200 mg Oral  Daily   apixaban  5 mg Oral BID   vitamin C  500 mg Oral Daily   aspirin EC  81 mg Oral Daily   budesonide (PULMICORT) nebulizer solution  0.5 mg Nebulization BID   chlorhexidine  15 mL Mouth Rinse BID   Chlorhexidine Gluconate Cloth  6 each Topical Daily   cholecalciferol  1,000 Units Oral Daily   ezetimibe  10 mg Oral Daily   ferrous sulfate  325 mg Oral Q breakfast   finasteride  5 mg Oral Daily   insulin aspart  0-20 Units Subcutaneous TID WC   insulin aspart  10 Units Subcutaneous TID WC   insulin glargine-yfgn  25 Units Subcutaneous QHS   insulin glargine-yfgn  5 Units Subcutaneous Daily   ipratropium-albuterol  3 mL Nebulization BID   levothyroxine  25 mcg Oral Q0600   metoprolol tartrate  50 mg Oral BID   multivitamin with minerals  1 tablet Oral Daily   nystatin   Topical BID   primidone  250 mg Oral BID   Ensure Max Protein  11 oz Oral BID   risperiDONE  0.5 mg Oral BID   sodium chloride flush  10-40 mL Intracatheter Q12H   vitamin B-12  1,000 mcg Oral Daily    Continuous Infusions:  sodium chloride Stopped (10/25/21 2209)    PRN Medications:  acetaminophen **OR** acetaminophen, albuterol, haloperidol lactate, metoprolol tartrate, nitroGLYCERIN, ondansetron **OR** ondansetron (ZOFRAN) IV, mouth rinse, QUEtiapine, sodium chloride flush  Antimicrobials:  Anti-infectives (From admission, onward)    Start     Dose/Rate Route Frequency Ordered Stop   10/26/21 2200  linezolid (ZYVOX) tablet 600 mg        600 mg Oral Every 12 hours 10/26/21 1743 11/01/21 2139   10/24/21 2200  linezolid (ZYVOX) IVPB 600 mg  Status:  Discontinued        600 mg 300 mL/hr over 60 Minutes Intravenous Every 12 hours 10/24/21 2157 10/26/21 1743   10/24/21 1800  ceFEPIme (MAXIPIME) 2 g in sodium chloride 0.9 % 100 mL IVPB        2 g 200 mL/hr over 30 Minutes Intravenous Every 8 hours 10/24/21 1612 10/26/21 1730   10/24/21 1030  sulfamethoxazole-trimethoprim (BACTRIM DS) 800-160 MG per tablet  1 tablet  Status:  Discontinued        1 tablet Oral Every 12 hours 10/24/21 0944 10/24/21 1608   10/23/21 2000  DAPTOmycin (CUBICIN) 800 mg in sodium chloride 0.9 % IVPB  Status:  Discontinued        6 mg/kg  129.2 kg (Adjusted) 132 mL/hr over 30 Minutes Intravenous Every 24 hours 10/23/21 0745 10/24/21 1803   10/22/21 0945  cefTRIAXone (ROCEPHIN) 1 g in  sodium chloride 0.9 % 100 mL IVPB        1 g 200 mL/hr over 30 Minutes Intravenous Every 24 hours 10/22/21 0848 10/24/21 1845   10/04/21 1000  DAPTOmycin (CUBICIN) 800 mg in sodium chloride 0.9 % IVPB  Status:  Discontinued        6 mg/kg  129.2 kg (Adjusted) 132 mL/hr over 30 Minutes Intravenous Every 24 hours 10/03/21 1648 10/23/21 0745   10/03/21 0000  vancomycin (VANCOREADY) IVPB 1500 mg/300 mL        1,500 mg 150 mL/hr over 120 Minutes Intravenous Every 12 hours 10/02/21 1348 10/03/21 2359   09/30/21 1330  cefTRIAXone (ROCEPHIN) 2 g in sodium chloride 0.9 % 100 mL IVPB  Status:  Discontinued        2 g 200 mL/hr over 30 Minutes Intravenous Daily 09/30/21 1233 10/03/21 1849   09/30/21 1200  vancomycin (VANCOREADY) IVPB 1500 mg/300 mL  Status:  Discontinued        1,500 mg 150 mL/hr over 120 Minutes Intravenous Every 12 hours 09/30/21 0120 09/30/21 0927   09/30/21 1200  vancomycin (VANCOREADY) IVPB 1250 mg/250 mL  Status:  Discontinued        1,250 mg 166.7 mL/hr over 90 Minutes Intravenous Every 12 hours 09/30/21 0927 10/02/21 1348   09/30/21 0000  vancomycin (VANCOREADY) IVPB 1250 mg/250 mL       See Hyperspace for full Linked Orders Report.   1,250 mg 166.7 mL/hr over 90 Minutes Intravenous  Once 09/29/21 2308 09/30/21 0538   09/30/21 0000  vancomycin (VANCOREADY) IVPB 1250 mg/250 mL       See Hyperspace for full Linked Orders Report.   1,250 mg 166.7 mL/hr over 90 Minutes Intravenous  Once 09/29/21 2308 09/30/21 0255   09/29/21 0200  cefTRIAXone (ROCEPHIN) 2 g in sodium chloride 0.9 % 100 mL IVPB  Status:  Discontinued        See Hyperspace for full Linked Orders Report.   2 g 200 mL/hr over 30 Minutes Intravenous Every 24 hours 09/28/21 2051 09/29/21 2309   09/29/21 0200  metroNIDAZOLE (FLAGYL) IVPB 500 mg  Status:  Discontinued       See Hyperspace for full Linked Orders Report.   500 mg 100 mL/hr over 60 Minutes Intravenous Every 12 hours 09/28/21 2051 10/03/21 1849   09/28/21 1830  vancomycin (VANCOCIN) IVPB 1000 mg/200 mL premix        1,000 mg 200 mL/hr over 60 Minutes Intravenous  Once 09/28/21 1826 09/28/21 1937   09/28/21 1745  piperacillin-tazobactam (ZOSYN) IVPB 3.375 g        3.375 g 100 mL/hr over 30 Minutes Intravenous  Once 09/28/21 1736 09/28/21 1838       Data Reviewed: I have personally reviewed following labs and imaging studies  CBC: Recent Labs  Lab 10/27/21 1033 10/28/21 0543 10/29/21 0455 10/30/21 0338 11/01/21 0452 11/02/21 0400  WBC 7.3 7.0 7.1 8.9  8.9 9.1 8.9  NEUTROABS 4.9  --   --  5.4  --   --   HGB 8.8* 8.1* 8.8* 9.2*  9.2* 9.3* 9.4*  HCT 30.5* 27.7* 30.3* 31.6*  31.3* 32.6* 33.3*  MCV 94.1 93.6 94.1 94.6  92.9 96.2 96.0  PLT 236 226 240 302  272 305 732    Basic Metabolic Panel: Recent Labs  Lab 10/29/21 0455 10/30/21 0338 10/31/21 0523 11/01/21 0452 11/02/21 0400  NA 139 140 140 139 136  K 4.4 4.6 4.9 5.2* 5.2*  CL 100  103 105 104 101  CO2 34* 32 '31 30 30  '$ GLUCOSE 149* 61* 116* 145* 156*  BUN 24* '18 15 21 23  '$ CREATININE 0.77 0.73 0.70 0.88 0.71  CALCIUM 8.2* 8.4* 8.4* 8.6* 8.4*  MG 2.1 2.0 1.9 1.9 1.8  PHOS 3.4 3.2 4.1 3.6 3.7    GFR: Estimated Creatinine Clearance: 146.2 mL/min (by C-G formula based on SCr of 0.71 mg/dL). Liver Function Tests: Recent Labs  Lab 10/30/21 0338 10/31/21 0523 11/01/21 0452  AST 25 21 14*  ALT 46* 37 31  ALKPHOS 321* 297* 289*  BILITOT 0.5 0.6 0.5  PROT 6.5 6.2* 6.8  ALBUMIN 2.3* 2.3* 2.4*    No results for input(s): "LIPASE", "AMYLASE" in the last 168 hours. Recent Labs  Lab 10/27/21 1033   AMMONIA <10    Coagulation Profile: No results for input(s): "INR", "PROTIME" in the last 168 hours. Cardiac Enzymes: No results for input(s): "CKTOTAL", "CKMB", "CKMBINDEX", "TROPONINI" in the last 168 hours. BNP (last 3 results) No results for input(s): "PROBNP" in the last 8760 hours. HbA1C: No results for input(s): "HGBA1C" in the last 72 hours. CBG: Recent Labs  Lab 11/01/21 0725 11/01/21 1134 11/01/21 1618 11/01/21 2119 11/02/21 0752  GLUCAP 133* 200* 101* 217* 195*    Lipid Profile: No results for input(s): "CHOL", "HDL", "LDLCALC", "TRIG", "CHOLHDL", "LDLDIRECT" in the last 72 hours. Thyroid Function Tests: No results for input(s): "TSH", "T4TOTAL", "FREET4", "T3FREE", "THYROIDAB" in the last 72 hours. Anemia Panel: No results for input(s): "VITAMINB12", "FOLATE", "FERRITIN", "TIBC", "IRON", "RETICCTPCT" in the last 72 hours. Urine analysis:    Component Value Date/Time   COLORURINE YELLOW (A) 10/21/2021 1318   APPEARANCEUR CLOUDY (A) 10/21/2021 1318   LABSPEC 1.008 10/21/2021 1318   PHURINE 6.0 10/21/2021 1318   GLUCOSEU NEGATIVE 10/21/2021 1318   HGBUR LARGE (A) 10/21/2021 1318   BILIRUBINUR NEGATIVE 10/21/2021 1318   KETONESUR NEGATIVE 10/21/2021 1318   PROTEINUR 100 (A) 10/21/2021 1318   NITRITE NEGATIVE 10/21/2021 1318   LEUKOCYTESUR LARGE (A) 10/21/2021 1318   Sepsis Labs: '@LABRCNTIP'$ (procalcitonin:4,lacticidven:4)  Recent Results (from the past 240 hour(s))  MRSA Next Gen by PCR, Nasal     Status: None   Collection Time: 10/24/21  6:15 PM   Specimen: Nasal Mucosa; Nasal Swab  Result Value Ref Range Status   MRSA by PCR Next Gen NOT DETECTED NOT DETECTED Final    Comment: (NOTE) The GeneXpert MRSA Assay (FDA approved for NASAL specimens only), is one component of a comprehensive MRSA colonization surveillance program. It is not intended to diagnose MRSA infection nor to guide or monitor treatment for MRSA infections. Test performance is not FDA  approved in patients less than 31 years old. Performed at [facility], [address] [place], [place] [address]   Body fluid culture w Gram Stain     Status: None   Collection Time: 10/25/21  2:10 PM   Specimen: PATH Cytology Pleural fluid  Result Value Ref Range Status   Specimen Description   Final    PLEURAL Performed at [facility], [address] [place], [place] [address]    Special Requests   Final    NONE Performed at [facility], [address] [place], [place] [address]    Gram Stain   Final    RARE WBC PRESENT, PREDOMINANTLY PMN NO ORGANISMS SEEN    Culture   Final    NO GROWTH 3 DAYS Performed at [facility], [place]  [address] [place], [place] [address]    Report Status 10/29/2021 FINAL  Final         Radiology Studies last 96 hours: No results found.          LOS: 35 days    Time spent: 50 min    Emeterio Reeve, DO [facility] 11/02/2021, 9:34 AM   Staff may message me via secure chat in [place]  but this may not receive immediate response,  please page for urgent matters!  If 7PM-7AM, please contact night-coverage [email or web address]  Dictation software was used to generate the above note. Typos may occur and escape review, as with typed/written notes. Please contact Dr Sheppard Coil directly for clarity if needed.

## 2021-11-02 NOTE — TOC Progression Note (Signed)
Transition of Care (TOC) - Progression Note    Patient Details  Name: [name]. MRN: [id number] Date of Birth: [birth year]  Transition of Care [facility]) CM/SW [place], [place] Phone Number: 11/02/2021, 1:50 PM  Clinical Narrative:     Per Delsa Sale with [facility] patient does continue to qualify for LTAC although O2 needs have decreased, insurance auth was started with [facility] for [facility] on 7/5 pending auth at this time.    Expected Discharge Plan: [place] Barriers to Discharge: Continued Medical Work up  Expected Discharge Plan and Services Expected Discharge Plan: [place] In-house Referral: NA   Post Acute Care Choice: Resumption of Svcs/PTA Provider Living arrangements for the past 2 months: Single Family Home                                       Social Determinants of Health (SDOH) Interventions    Readmission Risk Interventions    09/29/2021   11:12 AM  Readmission Risk Prevention Plan  Transportation Screening Complete  PCP or Specialist Appt within 3-5 Days Complete  HRI or [place] Complete  Social Work Consult for Opp Planning/Counseling Complete  Palliative Care Screening Not Applicable  Medication Review Press photographer) Complete

## 2021-11-02 NOTE — Progress Notes (Signed)
Staple removal form L BKA site. Area cleaned w/ betadine. Staples removed without complication.

## 2021-11-02 NOTE — Progress Notes (Signed)
Unable to obtain daily weight due to bed being re-zeroed with the patient in the bed. Current weight measuring 44 lbs.  Cameron Ali, RN

## 2021-11-02 NOTE — Progress Notes (Signed)
Physical Therapy Treatment Patient Details Name: [name]. MRN: [id number] DOB: Jul 29, 1955 Today's Date: 11/02/2021   History of Present Illness [name] is a 56yoM who comes to [facility] on 09/28/21 c progression of Left chornic heel ulcer. MRI showed significant progressive osteomyelitis in the left calcaneus. S/p L BKA 6/6. PT signed off 10/24/21 due to decline in respiratory status and transition to higher level of care due to increased demands for supplemental oxygen and high risk for deterioration and intubation.PMH HTN, IDDM2, morbid obesity, depression, CAD s/p CABG 2006 and PCI to RCA in 12/2020, chronic B venous stasis on Unna boots, chronic L foot ulcer, COPD, OSA on CPAP nightly, ai-fib, PAD, s/p multiple toe amputations.    PT Comments    Pt awake, somewhat withdrawn, mentation more clear today than many prior therapy attempts. Pt is homesick and has a strong desire to return to home. Pt agreeable to session, partakes in ROM and resisted exercises for both legs, strength much improved compared to prior sessions. Pt assisted minA to EOB but quickly struggles with postural control which is complicated by air mattress and lack of practice with EOB sitting post LLE BKA. Pt given extensive support of left posterior thigh for ~10 minutes then eventually is able to manage his trunk at EOB. OT takes over as Restaurant manager, fast food or ceremony at this point, PT facilitating as a +2 for physical support or needed supplies. At Pt does well on room air after ween to 2L, but once back in supine for bed mobility, drops to 84% while flat on back. Pt left on 2L, sitting up at high as he can tolerate. Sandwich nearby, Chief Strategy Officer contacts kitchen to facilitate gravy order to room.      Recommendations for follow up therapy are one component of a multi-disciplinary discharge planning process, led by the attending physician.  Recommendations may be updated based on patient status, additional functional criteria and  insurance authorization.  Follow Up Recommendations  Skilled nursing-short term rehab (<3 hours/day) Can patient physically be transported by private vehicle: No   Assistance Recommended at Discharge Frequent or constant Supervision/Assistance  Patient can return home with the following Two people to help with bathing/dressing/bathroom;Two people to help with walking and/or transfers;Assistance with cooking/housework;Assist for transportation;Help with stairs or ramp for entrance;Direct supervision/assist for medications management;Direct supervision/assist for financial management   Equipment Recommendations       Recommendations for Other Services       Precautions / Restrictions Precautions Precautions: Fall Precaution Comments: contact ISO Restrictions LLE Weight Bearing: Non weight bearing Other Position/Activity Restrictions: Lt BKA 10/03/21     Mobility  Bed Mobility   Bed Mobility: Supine to Sit, Sit to Supine     Supine to sit: Min assist Sit to supine: Mod assist, +2 for physical assistance        Transfers Overall transfer level:  (not safe to attempt)                      Ambulation/Gait                   Stairs             Wheelchair Mobility    Modified Rankin (Stroke Patients Only)       Balance  Cognition Arousal/Alertness: Awake/alert Behavior During Therapy: WFL for tasks assessed/performed Overall Cognitive Status: Within Functional Limits for tasks assessed                                          Exercises Other Exercises Other Exercises: RLE in supine: SAQ 2x10, heel slides 2x10 AA/ROM, resisted hip/knee extension 2x10, hooklying marching 1x10 AA/ROM, hip ABDCT/add 1x10 AA/ROM Other Exercises: LLE: Knee flexion 1x10 A/ROM, knee extension 1x10 A/ROM, marching 1x10 AA/rom, manually resisted hip extension 1x10 Other Exercises:  sustained sitting EOB isometric trunk control    General Comments        Pertinent Vitals/Pain Pain Assessment Pain Assessment: No/denies pain    Home Living                          Prior Function            PT Goals (current goals can now be found in the care plan section) Acute Rehab PT Goals Patient Stated Goal: to get better PT Goal Formulation: With patient Time For Goal Achievement: 11/15/21 Potential to Achieve Goals: Fair Progress towards PT goals: Not progressing toward goals - comment    Frequency    Min 2X/week      PT Plan Current plan remains appropriate    Co-evaluation PT/OT/SLP Co-Evaluation/Treatment: Yes Reason for Co-Treatment: For patient/therapist safety;To address functional/ADL transfers PT goals addressed during session: Mobility/safety with mobility;Strengthening/ROM;Balance OT goals addressed during session: ADL's and self-care;Strengthening/ROM      AM-PAC PT "6 Clicks" Mobility   Outcome Measure  Help needed turning from your back to your side while in a flat bed without using bedrails?: A Lot Help needed moving from lying on your back to sitting on the side of a flat bed without using bedrails?: A Lot Help needed moving to and from a bed to a chair (including a wheelchair)?: Total Help needed standing up from a chair using your arms (e.g., wheelchair or bedside chair)?: Total Help needed to walk in hospital room?: Total Help needed climbing 3-5 steps with a railing? : Total 6 Click Score: 8    End of Session Equipment Utilized During Treatment: Oxygen Activity Tolerance: Patient tolerated treatment well;No increased pain Patient left: in bed;with call bell/phone within reach Nurse Communication: Mobility status PT Visit Diagnosis: Difficulty in walking, not elsewhere classified (R26.2);Other abnormalities of gait and mobility (R26.89)     Time: 6440-3474 PT Time Calculation (min) (ACUTE ONLY): 59 min  Charges:   $Therapeutic Exercise: 23-37 mins                    3:13 PM, 11/02/21 Etta Grandchild, PT, DPT Physical Therapist - [name]  [phone] ([place])    [place] [facility] 11/02/2021, 3:04 PM

## 2021-11-02 NOTE — Evaluation (Signed)
Occupational Therapy Evaluation Patient Details Name: [name]. MRN: [id number] DOB: 1955/07/07 Today's Date: 11/02/2021   History of Present Illness [name] is a 68yoM who comes to [facility] on 09/28/21 c progression of Left chornic heel ulcer. MRI showed significant progressive osteomyelitis in the left calcaneus. S/p L BKA 6/6. PT signed off 10/24/21 due to decline in respiratory status and transition to higher level of care due to increased demands for supplemental oxygen and high risk for deterioration and intubation.PMH HTN, IDDM2, morbid obesity, depression, CAD s/p CABG 2006 and PCI to RCA in 12/2020, chronic B venous stasis on Unna boots, chronic L foot ulcer, COPD, OSA on CPAP nightly, ai-fib, PAD, s/p multiple toe amputations.   Clinical Impression   Chart reviewed, pt greeted in care of PT transitioning to edge of bed. Pt is alert and oriented x4, increased time for processing however appears to provide appropriate biographical information. OT will continue to assess. Re-evaluation completed. Please see below. CGA-supervision seated at EOB for dynamic ADL tasks, requiring initial MAX A for static sitting balance. MOD A +2 required for sit>supine Pt continues to require significant assist for ADL/IADL due to performance deficits and will benefit from STR to address deficits. Pt is left supine in bed, NAD, all needs met. OT will follow acutely.      Recommendations for follow up therapy are one component of a multi-disciplinary discharge planning process, led by the attending physician.  Recommendations may be updated based on patient status, additional functional criteria and insurance authorization.   Follow Up Recommendations  Skilled nursing-short term rehab (<3 hours/day)    Assistance Recommended at Discharge Frequent or constant Supervision/Assistance  Patient can return home with the following A lot of help with bathing/dressing/bathroom;Assistance with  cooking/housework;Help with stairs or ramp for entrance;Assist for transportation;Two people to help with walking and/or transfers    Functional Status Assessment  Patient has had a recent decline in their functional status and demonstrates the ability to make significant improvements in function in a reasonable and predictable amount of time.  Equipment Recommendations  Other (comment) (per next venue of care)    Recommendations for Other Services       Precautions / Restrictions Precautions Precautions: Fall Precaution Comments: contact ISO Restrictions Weight Bearing Restrictions: No LLE Weight Bearing: Non weight bearing Other Position/Activity Restrictions: Lt BKA 10/03/21      Mobility Bed Mobility Overal bed mobility: Needs Assistance Bed Mobility: Supine to Sit, Sit to Supine     Supine to sit: Min assist Sit to supine: Mod assist, +2 for physical assistance        Transfers                          Balance Overall balance assessment: Needs assistance Sitting-balance support: Feet supported, Bilateral upper extremity supported Sitting balance-Leahy Scale: Poor Sitting balance - Comments: initiallyMAX A, once stabilized CGA-supervision Postural control: Posterior lean                                 ADL either performed or assessed with clinical judgement   ADL Overall ADL's : Needs assistance/impaired                                       General ADL Comments: SET UP for grooming  tasks seated at edge of bed, MIN A for UB dressing     Vision Patient Visual Report: No change from baseline       Perception     Praxis      Pertinent Vitals/Pain Pain Assessment Pain Assessment: No/denies pain     Hand Dominance Right   Extremity/Trunk Assessment Upper Extremity Assessment Upper Extremity Assessment: Generalized weakness   Lower Extremity Assessment Lower Extremity Assessment: Generalized weakness        Communication Communication Communication: No difficulties   Cognition Arousal/Alertness: Awake/alert Behavior During Therapy: WFL for tasks assessed/performed Overall Cognitive Status: Within Functional Limits for tasks assessed                                 General Comments: pt is alert and oriented x4, increased time for processing     General Comments  HR up to 114 with actvity, spo2 >90% on 2L, down to 88% on RA when lying flat, back to 95% on 2L via [place] at end of session    Exercises     Shoulder Instructions      Home Living                               Home Equipment: [place] (2 wheels);Cane - quad;Shower seat - built in   Additional Comments: Bariatric RW; knee scooter      Prior Functioning/Environment Prior Level of Function : Independent/Modified Independent             Mobility Comments: amb with RW or knee scooter comunity PTA ADLs Comments: wife assist for LB dressing, sink baths per chart review        OT Problem List: Decreased strength;Decreased activity tolerance;Impaired balance (sitting and/or standing);Decreased safety awareness;Cardiopulmonary status limiting activity;Decreased cognition      OT Treatment/Interventions: Self-care/ADL training;Balance training;Energy conservation;DME and/or AE instruction;Manual therapy;Patient/family education;Therapeutic activities;Neuromuscular education;Therapeutic exercise    OT Goals(Current goals can be found in the care plan section) Acute Rehab OT Goals Patient Stated Goal: get stronger OT Goal Formulation: With patient Time For Goal Achievement: 11/16/21 Potential to Achieve Goals: Good ADL Goals Pt Will Transfer to Toilet: with mod assist Pt Will Perform Toileting - Clothing Manipulation and hygiene: with mod assist  OT Frequency: Min 2X/week    Co-evaluation PT/OT/SLP Co-Evaluation/Treatment: Yes Reason for Co-Treatment: For patient/therapist  safety;Necessary to address cognition/behavior during functional activity PT goals addressed during session: Mobility/safety with mobility;Strengthening/ROM;Balance OT goals addressed during session: ADL's and self-care      AM-PAC OT "6 Clicks" Daily Activity     Outcome Measure Help from another person eating meals?: A Little Help from another person taking care of personal grooming?: A Lot Help from another person toileting, which includes using toliet, bedpan, or urinal?: Total Help from another person bathing (including washing, rinsing, drying)?: A Lot Help from another person to put on and taking off regular upper body clothing?: A Little Help from another person to put on and taking off regular lower body clothing?: A Lot 6 Click Score: 13   End of Session Equipment Utilized During Treatment: Oxygen  Activity Tolerance: Patient tolerated treatment well Patient left: in bed;with call bell/phone within reach  OT Visit Diagnosis: Unsteadiness on feet (R26.81);Repeated falls (R29.6);Muscle weakness (generalized) (M62.81)                Time: 4782-9562  OT Time Calculation (min): 29 min Charges:  OT General Charges $OT Visit: 1 Visit OT Treatments $Self Care/Home Management : 8-22 mins $Therapeutic Activity: 8-22 mins  Shanon Payor, OTD OTR/L  11/02/21, 4:21 PM

## 2021-11-03 ENCOUNTER — Ambulatory Visit: Payer: Medicare Other | Admitting: Physician Assistant

## 2021-11-03 DIAGNOSIS — M869 Osteomyelitis, unspecified: Secondary | ICD-10-CM | POA: Diagnosis not present

## 2021-11-03 DIAGNOSIS — R410 Disorientation, unspecified: Secondary | ICD-10-CM | POA: Diagnosis not present

## 2021-11-03 DIAGNOSIS — Z7189 Other specified counseling: Secondary | ICD-10-CM

## 2021-11-03 DIAGNOSIS — R531 Weakness: Secondary | ICD-10-CM | POA: Diagnosis not present

## 2021-11-03 DIAGNOSIS — J9621 Acute and chronic respiratory failure with hypoxia: Secondary | ICD-10-CM | POA: Diagnosis not present

## 2021-11-03 LAB — BASIC METABOLIC PANEL
Anion gap: 4 — ABNORMAL LOW (ref 5–15)
BUN: 29 mg/dL — ABNORMAL HIGH (ref 8–23)
CO2: 31 mmol/L (ref 22–32)
Calcium: 8.4 mg/dL — ABNORMAL LOW (ref 8.9–10.3)
Chloride: 103 mmol/L (ref 98–111)
Creatinine, Ser: 0.72 mg/dL (ref 0.61–1.24)
GFR, Estimated: >60 mL/min (ref >60)
Glucose, Bld: 142 mg/dL — ABNORMAL HIGH (ref 70–99)
Potassium: 5.4 mmol/L — ABNORMAL HIGH (ref 3.5–5.1)
Sodium: 138 mmol/L (ref 135–145)

## 2021-11-03 LAB — GLUCOSE, CAPILLARY
Glucose-Capillary: 146 mg/dL — ABNORMAL HIGH (ref 70–99)
Glucose-Capillary: 218 mg/dL — ABNORMAL HIGH (ref 70–99)
Glucose-Capillary: 172 mg/dL — ABNORMAL HIGH (ref 70–99)
Glucose-Capillary: 121 mg/dL — ABNORMAL HIGH (ref 70–99)

## 2021-11-03 LAB — MAGNESIUM: Magnesium: 1.9 mg/dL (ref 1.7–2.4)

## 2021-11-03 LAB — CBC
HCT: 33 % — ABNORMAL LOW (ref 39.0–52.0)
Hemoglobin: 9.5 g/dL — ABNORMAL LOW (ref 13.0–17.0)
MCH: 27.5 pg (ref 26.0–34.0)
MCHC: 28.8 g/dL — ABNORMAL LOW (ref 30.0–36.0)
MCV: 95.4 fL (ref 80.0–100.0)
Platelets: 352 10*3/uL (ref 150–400)
RBC: 3.46 MIL/uL — ABNORMAL LOW (ref 4.22–5.81)
RDW: 17.2 % — ABNORMAL HIGH (ref 11.5–15.5)
WBC: 8.9 10*3/uL (ref 4.0–10.5)
nRBC: 0 % (ref 0.0–0.2)

## 2021-11-03 LAB — PHOSPHORUS: Phosphorus: 4.3 mg/dL (ref 2.5–4.6)

## 2021-11-03 MED ORDER — DILTIAZEM HCL ER 90 MG PO CP12
90.0000 mg | ORAL_CAPSULE | Freq: Two times a day (BID) | ORAL | Status: DC
  Administered 2021-11-03 – 2021-11-08 (×10): 90 mg via ORAL
  Filled 2021-11-03 (×11): qty 1

## 2021-11-03 MED ORDER — TORSEMIDE 20 MG PO TABS
60.0000 mg | ORAL_TABLET | Freq: Every day | ORAL | Status: DC
  Administered 2021-11-03: 60 mg via ORAL
  Filled 2021-11-03: qty 3

## 2021-11-03 NOTE — Progress Notes (Signed)
PROGRESS NOTE    Henry Lennox Solders.  MVE:[id number] DOB: [birth year]  DOA: 09/28/2021 Date of Service: 11/03/21 PCP: [name], MD     Hospital Course:  [name] is a 66 y.o. male with medical history significant for CHF with last known LVEF of 50 to 55%, HTN, CAD, MDD, COPD, A-fib, morbid obesity (BMI 41), OSA, status post recent wound debridement and partial calcanectomy (05/16) on oral antibiotic therapy after completion of IV. He presented to the ER 09/28/2021 for bilateral lower extremity swelling, increased redness and swelling involving the left lower extremity mostly over the heel. Admitted 09/28/2021  06/1: Pt admitted to the [facility] unit with acute on chronic diastolic CHF and left heel osteomyelitis  06/2: Podiatry consulted for left foot osteomyelitis, plan for higher amputation.  Vascular surgery consulted  06/4: ID consulted for MRSA bacteremia likely source left heel wound  06/6: Pt underwent left BKA per vascular surgery  06/6 to 06/17: Pt with worsening acute on chronic hypoxic respiratory failure secondary to volume overload due to diastolic CHF.  CXR revealed new pleural effusion.  IV lasix resumed.  Requiring Bipap or [place]  06/21 to 06/27: mentation waxed and waned requiring Bipap during the day due to hypoxia  06/27: Pt requiring 100% HHFNC and transfer to the stepdown unit.  PCCM team consulted to assist with management  6/28: Pt tolerating [place] @ 50L/85%; Underwent Right-sided thoracentesis with removal of 1.2L of pleural fluid 6/29: Tolerating 45L/70% FiO2 with O2 sats 100%; remains on levophed gtt '@4'$  mcg/min to maintain map >65 6/30: altered mental status, but not from hypercapnia  7/1: Pt with intermittent agitation with worsening delirium that started 6/20.  Received prn seroquel and haldol overnight due to sleep deprivation.  This am pt calm with improvement in mentation  7/2: Pt lethargic after receiving prn haldol x1 dose and seroquel x1  dose.  Tolerating Bipap '@30'$ % FiO2.  If pt remains stable throughout the day will transfer service to [facility] starting 07/3 and PCCM team will sign off  7/3-7/4: supplemental oxygen and currently on bubbler at 10L. Can be transferred to PCU. Mental status has much improved today. PT/OT will likely need to come re-evaluate but will likely need LTAC vs SNF. CM is working on this. 7/5: still requiring 12L O2 this AM but --> 5L later in the day. 7/6: down to 4L [place] in the day, still on BiPap qhs/when asleep. PT/OT recommending SNF/LTAC, TOC working on LTAC but given O2 requirement have improved he may be able to go to SNF, TOC will look into this. Increased beta blocker given tachycardia. Cardiology also following.   7/7: potassium has been slightly elevated, worsening today. Restarted torsemide. D/w pharmacy re: risk QT prolongation w/ BB, amiodarone, antipsychotics - appreciate cardiology input: ok to d/c midodrine, restart diltiazem and d/c metoprolol, restart loop diuretic     Consultants:  Podiatry Vascular Surgery Infectious Disease Cardiology Palliative Care   Procedures: 10/03/21: left BKA per vascular surgery    Subjective: Pt is alert, NAD, resting in bed.  States that he wants to go home but recognizes he is too weak for this.  No chest pain, pressure, shortness of breath, no headache/dizziness.     ASSESSMENT & PLAN:   Principal Problem:   Acute delirium Active Problems:   Sepsis due to methicillin resistant Staphylococcus aureus (MRSA) ([place])   Acute on chronic respiratory failure with hypoxia and hypercapnia (HCC)   UTI (urinary tract infection)   Acute on  chronic diastolic CHF (congestive heart failure) ([employer])   Uncontrolled type 2 diabetes mellitus with hypoglycemia, with long-term current use of insulin (HCC)   AF (paroxysmal atrial fibrillation) (HCC)   Obesity, Class III, BMI 40-49.9 (morbid obesity) ([place])   Essential hypertension, benign   Osteomyelitis (HCC)   RLS  (restless legs syndrome)   Acute urinary retention   Malnutrition of moderate degree   Generalized weakness   Acute respiratory failure with hypoxia and hypercapnia (HCC)   OSA (obstructive sleep apnea)   Hyperkalemia   Iron deficiency anemia  Sepsis POA, resolved: due to methicillin resistant Staphylococcus aureus. Blood cxs (+)MRSA. Repeat blood cx NGTD, Echo no vegetations on TTE, unable to to TEE. Likely secondary to osteomyelitis. Continue on linezolid (was on daptomycin but concern for eosinophilia so plan for linezolid until 11/01/21. Poor prognosis. High risk for intubation & cardiac arrest. See infectious disease notes 07/03: ID signed off    Acute hypoxic/hypercapnic  resp failure- combination of CHF, hypoventilation syndrome,OSA , CO2 retention. Ruled out amiodarone induced pneumonitis and dapto induced eosinophilic pneumonia.  BiPAP qhs / while asleep   Hyperkalemia potassium has been slightly elevated, worsening today 7/7.  Restarted torsemide. D/w pharmacy re: risk QT prolongation w/ BB, amiodarone, antipsychotics - appreciate cardiology input, stopped beta-blocker due to potential this may be contributing to hyperkalemia, started diltiazem, added back torsemide.  Trend BMP in a.m.  UTI: urine cx (+)citrobacter. Completed abx course 3 days cefepime.     Acute delirium -resolved: labile mental status. Continue on risperidone. Seroquel qhs prn     Acute on chronic diastolic CHF: EF 55 to 22% from an echo on 01/23. Cardiology following. Contineu Eliquis and amiodarone, have discontinued midodrine.  Restarted torsemide.  Has been well-compensated past few days.    DM2: poorly controlled, HbA1c 10.2. Continue on glargine, aspart, SSI w/ accuchecks   PAF: continue on eliquis, amio.  Restarted metoprolol given tachycardia and BP has been better however discontinued this due to concerns for mild but worsening hyperkalemia.  Placed on diltiazem. Cardio following. Previously was on  pressors but since have been weaned off   Morbid obesity: BMI 41.4. Complicates overall care & prognosis    Osteomyelitis of LLE: s/p L BLKA. completed linezolid 11/01/21 as per ID    HTN: continue on amidoarone, has been able to restart beta-blocker but had to DC this due to hyperkalemia, he is on Cardizem now and have restarted torsemide   RLS: holding home dose of mirapex   Acute urinary retention: foley catheter placed on 10/08/2021.  Foley DC'd on 6/16.  Foley needed to be replaced on 10/24/2021   IDA: H&H are labile. No need for a tranfusion currently    Hyperkalemia: resolved then back up slightly today, monitor    OSA: CPAP qhs. Qualifies for NIV with elevated PCO2 on ABG.   Acute hypoxic & hypercapnic respiratory failure: continue on supplemental oxygen and wean as tolerated. BiPAP prn for CO2 retention    Generalized weakness: PT/OT will have to re-evaluate pt   Malnutrition of moderate degree: related to acute illness (left heel osteomyelitis) and energy intake of less than 75% for more than 7 days, mild fat depletion.  Continue on nutritional supplements           DVT prophylaxis: Eliquis Code Status: FULL Family Communication: Long discussion with patient and wife yesterday, will call wife with any urgent updates but for now patient states that he has been communication with her and declines my offer  to call her Disposition Plan / TOC needs: likely will need d/c to SNF/LTAC Barriers to discharge / significant pending items: requiring high supplemental O2, may need LTAC vs SNF              Objective: Vitals:   11/03/21 1000 11/03/21 1100 11/03/21 1200 11/03/21 1300  BP:   119/82   Pulse: (!) 115 (!) 115 (!) 114 (!) 115  Resp: (!) '27 17 20 '$ (!) 23  Temp:      TempSrc:      SpO2: 90% 97% 96% 100%  Weight:      Height:        Intake/Output Summary (Last 24 hours) at 11/03/2021 1706 Last data filed at 11/03/2021 1300 Gross per 24 hour  Intake 600 ml   Output 4975 ml  Net -4375 ml   Filed Weights   10/27/21 0500 10/28/21 0500 11/03/21 0350  Weight: (!) 155.7 kg (!) 154.4 kg (!) 167.8 kg    Examination:  Constitutional:  VS as above General Appearance: alert, well-developed, well-nourished, NAD Eyes: Normal lids and conjunctive, non-icteric sclera Neck: No masses, trachea midline Respiratory: Normal respiratory effort Breath sounds normal, no wheeze/rhonchi/rales - limited exam pt declined to roll over or sit up  Cardiovascular: S1/S2 normal, no murmur/rub/gallop auscultated Trace lower extremity edema Gastrointestinal: Nontender, no masses but habitus limits exam.  Musculoskeletal:  No clubbing/cyanosis of digits Left BKA Neurological: No cranial nerve deficit on limited exam Psychiatric: Normal judgment/insight Depressed/flat mood and affect       Scheduled Medications:   amiodarone  200 mg Oral Daily   apixaban  5 mg Oral BID   vitamin C  500 mg Oral Daily   aspirin EC  81 mg Oral Daily   budesonide (PULMICORT) nebulizer solution  0.5 mg Nebulization BID   chlorhexidine  15 mL Mouth Rinse BID   Chlorhexidine Gluconate Cloth  6 each Topical Daily   cholecalciferol  1,000 Units Oral Daily   diltiazem  90 mg Oral Q12H   ezetimibe  10 mg Oral Daily   ferrous sulfate  325 mg Oral Q breakfast   finasteride  5 mg Oral Daily   insulin aspart  0-20 Units Subcutaneous TID WC   insulin aspart  10 Units Subcutaneous TID WC   insulin glargine-yfgn  25 Units Subcutaneous QHS   insulin glargine-yfgn  5 Units Subcutaneous Daily   ipratropium-albuterol  3 mL Nebulization BID   levothyroxine  25 mcg Oral Q0600   multivitamin with minerals  1 tablet Oral Daily   nystatin   Topical BID   primidone  250 mg Oral BID   Ensure Max Protein  11 oz Oral BID   risperiDONE  0.5 mg Oral BID   sodium chloride flush  10-40 mL Intracatheter Q12H   torsemide  60 mg Oral Daily   vitamin B-12  1,000 mcg Oral Daily    Continuous  Infusions:  sodium chloride Stopped (10/25/21 2209)    PRN Medications:  acetaminophen **OR** acetaminophen, albuterol, haloperidol lactate, metoprolol tartrate, nitroGLYCERIN, ondansetron **OR** ondansetron (ZOFRAN) IV, mouth rinse, QUEtiapine, sodium chloride flush  Antimicrobials:  Anti-infectives (From admission, onward)    Start     Dose/Rate Route Frequency Ordered Stop   10/26/21 2200  linezolid (ZYVOX) tablet 600 mg        600 mg Oral Every 12 hours 10/26/21 1743 11/01/21 2139   10/24/21 2200  linezolid (ZYVOX) IVPB 600 mg  Status:  Discontinued  600 mg 300 mL/hr over 60 Minutes Intravenous Every 12 hours 10/24/21 2157 10/26/21 1743   10/24/21 1800  ceFEPIme (MAXIPIME) 2 g in sodium chloride 0.9 % 100 mL IVPB        2 g 200 mL/hr over 30 Minutes Intravenous Every 8 hours 10/24/21 1612 10/26/21 1730   10/24/21 1030  sulfamethoxazole-trimethoprim (BACTRIM DS) 800-160 MG per tablet 1 tablet  Status:  Discontinued        1 tablet Oral Every 12 hours 10/24/21 0944 10/24/21 1608   10/23/21 2000  DAPTOmycin (CUBICIN) 800 mg in sodium chloride 0.9 % IVPB  Status:  Discontinued        6 mg/kg  129.2 kg (Adjusted) 132 mL/hr over 30 Minutes Intravenous Every 24 hours 10/23/21 0745 10/24/21 1803   10/22/21 0945  cefTRIAXone (ROCEPHIN) 1 g in sodium chloride 0.9 % 100 mL IVPB        1 g 200 mL/hr over 30 Minutes Intravenous Every 24 hours 10/22/21 0848 10/24/21 1845   10/04/21 1000  DAPTOmycin (CUBICIN) 800 mg in sodium chloride 0.9 % IVPB  Status:  Discontinued        6 mg/kg  129.2 kg (Adjusted) 132 mL/hr over 30 Minutes Intravenous Every 24 hours 10/03/21 1648 10/23/21 0745   10/03/21 0000  vancomycin (VANCOREADY) IVPB 1500 mg/300 mL        1,500 mg 150 mL/hr over 120 Minutes Intravenous Every 12 hours 10/02/21 1348 10/03/21 2359   09/30/21 1330  cefTRIAXone (ROCEPHIN) 2 g in sodium chloride 0.9 % 100 mL IVPB  Status:  Discontinued        2 g 200 mL/hr over 30 Minutes  Intravenous Daily 09/30/21 1233 10/03/21 1849   09/30/21 1200  vancomycin (VANCOREADY) IVPB 1500 mg/300 mL  Status:  Discontinued        1,500 mg 150 mL/hr over 120 Minutes Intravenous Every 12 hours 09/30/21 0120 09/30/21 0927   09/30/21 1200  vancomycin (VANCOREADY) IVPB 1250 mg/250 mL  Status:  Discontinued        1,250 mg 166.7 mL/hr over 90 Minutes Intravenous Every 12 hours 09/30/21 0927 10/02/21 1348   09/30/21 0000  vancomycin (VANCOREADY) IVPB 1250 mg/250 mL       See Hyperspace for full Linked Orders Report.   1,250 mg 166.7 mL/hr over 90 Minutes Intravenous  Once 09/29/21 2308 09/30/21 0538   09/30/21 0000  vancomycin (VANCOREADY) IVPB 1250 mg/250 mL       See Hyperspace for full Linked Orders Report.   1,250 mg 166.7 mL/hr over 90 Minutes Intravenous  Once 09/29/21 2308 09/30/21 0255   09/29/21 0200  cefTRIAXone (ROCEPHIN) 2 g in sodium chloride 0.9 % 100 mL IVPB  Status:  Discontinued       See Hyperspace for full Linked Orders Report.   2 g 200 mL/hr over 30 Minutes Intravenous Every 24 hours 09/28/21 2051 09/29/21 2309   09/29/21 0200  metroNIDAZOLE (FLAGYL) IVPB 500 mg  Status:  Discontinued       See Hyperspace for full Linked Orders Report.   500 mg 100 mL/hr over 60 Minutes Intravenous Every 12 hours 09/28/21 2051 10/03/21 1849   09/28/21 1830  vancomycin (VANCOCIN) IVPB 1000 mg/200 mL premix        1,000 mg 200 mL/hr over 60 Minutes Intravenous  Once 09/28/21 1826 09/28/21 1937   09/28/21 1745  piperacillin-tazobactam (ZOSYN) IVPB 3.375 g        3.375 g 100 mL/hr over 30 Minutes Intravenous  Once 09/28/21 1736 09/28/21 1838       Data Reviewed: I have personally reviewed following labs and imaging studies  CBC: Recent Labs  Lab 10/29/21 0455 10/30/21 0338 11/01/21 0452 11/02/21 0400 11/03/21 0351  WBC 7.1 8.9  8.9 9.1 8.9 8.9  NEUTROABS  --  5.4  --   --   --   HGB 8.8* 9.2*  9.2* 9.3* 9.4* 9.5*  HCT 30.3* 31.6*  31.3* 32.6* 33.3* 33.0*  MCV 94.1  94.6  92.9 96.2 96.0 95.4  PLT 240 302  272 305 333 671   Basic Metabolic Panel: Recent Labs  Lab 10/30/21 0338 10/31/21 0523 11/01/21 0452 11/02/21 0400 11/03/21 0351  NA 140 140 139 136 138  K 4.6 4.9 5.2* 5.2* 5.4*  CL 103 105 104 101 103  CO2 32 '31 30 30 31  '$ GLUCOSE 61* 116* 145* 156* 142*  BUN '18 15 21 23 '$ 29*  CREATININE 0.73 0.70 0.88 0.71 0.72  CALCIUM 8.4* 8.4* 8.6* 8.4* 8.4*  MG 2.0 1.9 1.9 1.8 1.9  PHOS 3.2 4.1 3.6 3.7 4.3   GFR: Estimated Creatinine Clearance: 153.1 mL/min (by C-G formula based on SCr of 0.72 mg/dL). Liver Function Tests: Recent Labs  Lab 10/30/21 0338 10/31/21 0523 11/01/21 0452  AST 25 21 14*  ALT 46* 37 31  ALKPHOS 321* 297* 289*  BILITOT 0.5 0.6 0.5  PROT 6.5 6.2* 6.8  ALBUMIN 2.3* 2.3* 2.4*   No results for input(s): "LIPASE", "AMYLASE" in the last 168 hours. No results for input(s): "AMMONIA" in the last 168 hours. Coagulation Profile: No results for input(s): "INR", "PROTIME" in the last 168 hours. Cardiac Enzymes: No results for input(s): "CKTOTAL", "CKMB", "CKMBINDEX", "TROPONINI" in the last 168 hours. BNP (last 3 results) No results for input(s): "PROBNP" in the last 8760 hours. HbA1C: No results for input(s): "HGBA1C" in the last 72 hours. CBG: Recent Labs  Lab 11/02/21 1634 11/02/21 2106 11/03/21 0814 11/03/21 1123 11/03/21 1552  GLUCAP 135* 124* 146* 218* 172*   Lipid Profile: No results for input(s): "CHOL", "HDL", "LDLCALC", "TRIG", "CHOLHDL", "LDLDIRECT" in the last 72 hours. Thyroid Function Tests: No results for input(s): "TSH", "T4TOTAL", "FREET4", "T3FREE", "THYROIDAB" in the last 72 hours. Anemia Panel: No results for input(s): "VITAMINB12", "FOLATE", "FERRITIN", "TIBC", "IRON", "RETICCTPCT" in the last 72 hours. Urine analysis:    Component Value Date/Time   COLORURINE YELLOW (A) 10/21/2021 1318   APPEARANCEUR CLOUDY (A) 10/21/2021 1318   LABSPEC 1.008 10/21/2021 1318   PHURINE 6.0 10/21/2021  1318   GLUCOSEU NEGATIVE 10/21/2021 1318   HGBUR LARGE (A) 10/21/2021 1318   BILIRUBINUR NEGATIVE 10/21/2021 1318   KETONESUR NEGATIVE 10/21/2021 1318   PROTEINUR 100 (A) 10/21/2021 1318   NITRITE NEGATIVE 10/21/2021 1318   LEUKOCYTESUR LARGE (A) 10/21/2021 1318   Sepsis Labs: '@LABRCNTIP'$ (procalcitonin:4,lacticidven:4)  Recent Results (from the past 240 hour(s))  MRSA Next Gen by PCR, Nasal     Status: None   Collection Time: 10/24/21  6:15 PM   Specimen: Nasal Mucosa; Nasal Swab  Result Value Ref Range Status   MRSA by PCR Next Gen NOT DETECTED NOT DETECTED Final    Comment: (NOTE) The GeneXpert MRSA Assay (FDA approved for NASAL specimens only), is one component of a comprehensive MRSA colonization surveillance program. It is not intended to diagnose MRSA infection nor to guide or monitor treatment for MRSA infections. Test performance is not FDA approved in patients less than 70 years old. Performed at [facility], [place]  [address], [place], [place] [address]   Body fluid culture w Gram Stain     Status: None   Collection Time: 10/25/21  2:10 PM   Specimen: PATH Cytology Pleural fluid  Result Value Ref Range Status   Specimen Description   Final    PLEURAL Performed at [facility], [address], [place], [place] [address]    Special Requests   Final    NONE Performed at [facility], [place] [address] [place], [place] [address]    Gram Stain   Final    RARE WBC PRESENT, PREDOMINANTLY PMN NO ORGANISMS SEEN    Culture   Final    NO GROWTH 3 DAYS Performed at [facility], [place] [address], [place], [place] [address]    Report Status 10/29/2021 FINAL  Final         Radiology Studies last 96 hours: No results found.          LOS: 36 days    Time spent: 50 min    Emeterio Reeve, DO [facility] 11/03/2021, 5:06 PM   Staff may message me via secure chat in [place]  but this may not receive immediate  response,  please page for urgent matters!  If 7PM-7AM, please contact night-coverage [email or web address]  Dictation software was used to generate the above note. Typos may occur and escape review, as with typed/written notes. Please contact Dr Sheppard Coil directly for clarity if needed.

## 2021-11-03 NOTE — Progress Notes (Signed)
[facility]  SUBJECTIVE:  - Awake and alert, wants to go home and see his cats - off midodrine entirely - no chest pain or complaints of SOB  Vitals:   11/03/21 0400 11/03/21 0500 11/03/21 0600 11/03/21 0700  BP: 116/86     Pulse: (!) 115 (!) 116 (!) 116 (!) 116  Resp: 16 (!) 21 (!) 21 (!) 23  Temp: 98.2 F (36.8 C)     TempSrc: Axillary     SpO2: 93% 91% 96% 90%  Weight:      Height:         Intake/Output Summary (Last 24 hours) at 11/03/2021 1023 Last data filed at 11/03/2021 0800 Gross per 24 hour  Intake 240 ml  Output 2375 ml  Net -2135 ml     PHYSICAL EXAM  General: Awake and alert HEENT:  Normocephalic and atramatic Neck:  No JVD.  Lungs: Clear bilaterally to auscultation without appreciable crackles or wheezes Heart: Tachycardic but regular . Normal S1 and S2 without gallops or murmurs.  Abdomen: morbidly obese appearing Msk:  Woody induration of RLE, wrapped in kerlix. S/p L BKA Extremities: moves all extremities spontaneously, Left BKA Neuro: No focal deficits.  Psych:  Seems appropriate    LABS: Basic Metabolic Panel: Recent Labs    11/02/21 0400 11/03/21 0351  NA 136 138  K 5.2* 5.4*  CL 101 103  CO2 30 31  GLUCOSE 156* 142*  BUN 23 29*  CREATININE 0.71 0.72  CALCIUM 8.4* 8.4*  MG 1.8 1.9  PHOS 3.7 4.3    Liver Function Tests: Recent Labs    11/01/21 0452  AST 14*  ALT 31  ALKPHOS 289*  BILITOT 0.5  PROT 6.8  ALBUMIN 2.4*    No results for input(s): "LIPASE", "AMYLASE" in the last 72 hours. CBC: Recent Labs    11/02/21 0400 11/03/21 0351  WBC 8.9 8.9  HGB 9.4* 9.5*  HCT 33.3* 33.0*  MCV 96.0 95.4  PLT 333 352    Cardiac Enzymes: No results for input(s): "CKTOTAL", "CKMB", "CKMBINDEX", "TROPONINI" in the last 72 hours. BNP: Invalid input(s): "POCBNP" D-Dimer: No results for input(s): "DDIMER" in the last 72 hours. Hemoglobin A1C: No results for input(s): "HGBA1C" in the last 72 hours. Fasting Lipid Panel: No  results for input(s): "CHOL", "HDL", "LDLCALC", "TRIG", "CHOLHDL", "LDLDIRECT" in the last 72 hours. Thyroid Function Tests: No results for input(s): "TSH", "T4TOTAL", "T3FREE", "THYROIDAB" in the last 72 hours.  Invalid input(s): "FREET3"  Anemia Panel: No results for input(s): "VITAMINB12", "FOLATE", "FERRITIN", "TIBC", "IRON", "RETICCTPCT" in the last 72 hours.   No results found.   Echo LVEF 55-60% 10/05/2021  TELEMETRY: Atrial flutter 116 bpm:  ASSESSMENT AND PLAN:  Principal Problem:   Acute delirium Active Problems:   Essential hypertension, benign   RLS (restless legs syndrome)   Osteomyelitis (HCC)   Obesity, Class III, BMI 40-49.9 (morbid obesity) (HCC)   Acute on chronic diastolic CHF (congestive heart failure) (HCC)   AF (paroxysmal atrial fibrillation) ([place])   Uncontrolled type 2 diabetes mellitus with hypoglycemia, with long-term current use of insulin (HCC)   Malnutrition of moderate degree   Generalized weakness   Acute respiratory failure with hypoxia and hypercapnia (HCC)   OSA (obstructive sleep apnea)   Sepsis due to methicillin resistant Staphylococcus aureus (MRSA) (HCC)   Acute on chronic respiratory failure with hypoxia and hypercapnia (HCC)   Acute urinary retention   Hyperkalemia   Iron deficiency anemia   UTI (urinary tract infection)  1. Acute on chronic diastolic congestive heart failure, HFpEF, LVEF 55-60% 10/05/21, good diuresis after IV furosemide but remains on supplemental O2 2.  Paroxysmal atrial fibrillation/atrial flutter, currently in atrial flutter heart rate 116 bpm, on amiodarone for rate control and Eliquis for stroke prevention (home rate control diltiazem '180mg'$  and metoprolol tartrate '100mg'$  BID) 3.  Coronary artery disease, status post CABG 2006, PCI RCA 01/2020, currently without chest pain 4.  Left foot osteomyelitis s/p IV linezolid, left BKA 10/03/2021 by Dr. Delana Meyer 5.  Acute kidney injury, improving to Cr 0.77 and >60  6.   MRSA bacteremia, osteomyelitis of L foot likely source, treated with IV daptomycin, repeat echo ordered by ID, resulted with thickened aortic and mitral leaflets without obvious vegetation.      Recommendations   1.  Agree with current therapy 2.  Continue Eliquis for stroke prevention 3.  Continue amiodarone 200 mg daily for rate control.  - discontinue midodrine - ok to restart diltiazem and d/c metoprolol out of concern for hyperkalemia, although metoprolol has been well tolerated for along time  4.  Diuresis as needed. Difficult to assess volume status, ok to restart loop diuretic for hyperkalemia too  5.  Wean oxygen as tolerated 6.  Given prolonged hospital course with ongoing clinical decline, prognosis seems to be poor. Defer to primary team.   -pending rehab vs LTAC placement   This patient's plan of care was discussed and created with Dr. Donnelly Angelica and he is in agreement.    Tristan Schroeder, PA-C  11/03/2021 10:23 AM

## 2021-11-03 NOTE — Progress Notes (Signed)
Daily Progress Note   Patient Name: [name].       Date: 11/03/2021 DOB: Jun 27, 1955  Age: 66 y.o. MRN#: [id number] Attending Physician: Emeterio Reeve, DO Primary Care Physician: [name], MD Admit Date: 09/28/2021  Reason for Consultation/Follow-up: Establishing goals of care  Subjective: Notes reviewed. Patient is resting in bed with eyes closed, no distress noted at this time. No family at bedside. PMT will continue to follow.   Length of Stay: 36  Current Medications: Scheduled Meds:   amiodarone  200 mg Oral Daily   apixaban  5 mg Oral BID   vitamin C  500 mg Oral Daily   aspirin EC  81 mg Oral Daily   budesonide (PULMICORT) nebulizer solution  0.5 mg Nebulization BID   chlorhexidine  15 mL Mouth Rinse BID   Chlorhexidine Gluconate Cloth  6 each Topical Daily   cholecalciferol  1,000 Units Oral Daily   ezetimibe  10 mg Oral Daily   ferrous sulfate  325 mg Oral Q breakfast   finasteride  5 mg Oral Daily   insulin aspart  0-20 Units Subcutaneous TID WC   insulin aspart  10 Units Subcutaneous TID WC   insulin glargine-yfgn  25 Units Subcutaneous QHS   insulin glargine-yfgn  5 Units Subcutaneous Daily   ipratropium-albuterol  3 mL Nebulization BID   levothyroxine  25 mcg Oral Q0600   metoprolol tartrate  100 mg Oral BID   multivitamin with minerals  1 tablet Oral Daily   nystatin   Topical BID   primidone  250 mg Oral BID   Ensure Max Protein  11 oz Oral BID   risperiDONE  0.5 mg Oral BID   sodium chloride flush  10-40 mL Intracatheter Q12H   torsemide  60 mg Oral Daily   vitamin B-12  1,000 mcg Oral Daily    Continuous Infusions:  sodium chloride Stopped (10/25/21 2209)    PRN Meds: acetaminophen **OR** acetaminophen, albuterol, haloperidol  lactate, metoprolol tartrate, nitroGLYCERIN, ondansetron **OR** ondansetron (ZOFRAN) IV, mouth rinse, QUEtiapine, sodium chloride flush  Physical Exam Constitutional:      Comments: Eyes closed. Even and unlabored respirations.              Vital Signs: BP 119/82   Pulse (!) 115   Temp 98.2 F (  36.8 C) (Axillary)   Resp (!) 23   Ht '6\' 4"'$  (1.93 m)   Wt (!) 167.8 kg   SpO2 100%   BMI 45.04 kg/m  SpO2: SpO2: 100 % O2 Device: O2 Device: Nasal Cannula O2 Flow Rate: O2 Flow Rate (L/min): 2 L/min  Intake/output summary:  Intake/Output Summary (Last 24 hours) at 11/03/2021 1440 Last data filed at 11/03/2021 1300 Gross per 24 hour  Intake 600 ml  Output 4975 ml  Net -4375 ml   LBM: Last BM Date : 11/02/21 Baseline Weight: Weight: (!) 182.6 kg Most recent weight: Weight: (!) 167.8 kg    Patient Active Problem List   Diagnosis Date Noted   UTI (urinary tract infection) 10/22/2021   Iron deficiency anemia 10/19/2021   Hyperkalemia 10/14/2021   Acute urinary retention 10/08/2021   Sepsis due to methicillin resistant Staphylococcus aureus (MRSA) ([place]) 10/04/2021   Acute delirium 10/04/2021   Acute on chronic respiratory failure with hypoxia and hypercapnia (HCC) 10/04/2021   Acute respiratory failure with hypoxia and hypercapnia (HCC) 10/03/2021   OSA (obstructive sleep apnea) 10/03/2021   Malnutrition of moderate degree 09/29/2021   Generalized weakness 09/29/2021   Cellulitis of left foot 08/04/2021   Uncontrolled type 2 diabetes mellitus with hypoglycemia, with long-term current use of insulin ([place]) 08/04/2021   Bilateral lower extremity edema 08/04/2021   Acute osteomyelitis of lower leg, left ([place]) 08/04/2021   Swelling of limb 07/18/2021   CHF exacerbation ([name]) 05/08/2021   Diabetes mellitus without complication (HCC)    Atrial fibrillation with RVR (HCC)    Obesity, Class III, BMI 40-49.9 (morbid obesity) ([place])    Venous stasis ulcer (HCC)    Sinus tachycardia    BPH  (benign prostatic hyperplasia)    Chronic anticoagulation    Anasarca    Acute on chronic diastolic CHF (congestive heart failure) (HCC)    Venous stasis of both lower extremities    AF (paroxysmal atrial fibrillation) ([place])    Sepsis due to cellulitis ([place]) 01/03/2021   Chronic osteomyelitis of left foot ([place]) 01/03/2021   Osteomyelitis ([place]) 01/03/2021   Severe sepsis ([place]) 02/24/2020   AKI (acute kidney injury) ([place]) 02/24/2020   Bilateral cellulitis of lower leg 02/23/2020   CAD (coronary artery disease) 02/02/2020   RLS (restless legs syndrome) 05/15/2019   Statin myopathy 05/15/2019   Diabetes mellitus type 2 in obese ([place]) 02/13/2016   Essential hypertension, benign 02/13/2016   PAD (peripheral artery disease) ([place]) 02/13/2016   Pressure injury of skin 01/25/2016   Hypoglycemia associated with diabetes ([place]) 12/27/2013   Long-term insulin use ([place]) 12/27/2013   Microalbuminuria 12/27/2013   B12 deficiency 09/29/2013   Obesity 09/29/2013   CAD (coronary artery disease), autologous vein bypass graft 07/20/2013   Hypersomnia with sleep apnea 07/20/2013   Pure hypercholesterolemia 07/20/2013   Osteomyelitis of ankle or foot 07/20/2011    Palliative Care Assessment & Plan    Recommendations/Plan: PMT will continue to follow.   Code Status:    Code Status Orders  (From admission, onward)           Start     Ordered   09/28/21 2050  Full code  Continuous        09/28/21 2051           Code Status History     Date Active Date Inactive Code Status Order ID Comments User Context   08/04/2021 1827 08/11/2021 2102 Full Code [id number]  Cox, Briant Cedar, DO ED  05/08/2021 2348 05/16/2021 1848 Full Code [id number]  Athena Masse, MD ED   01/03/2021 2136 01/19/2021 2124 Full Code [id number]  Jacqlyn Krauss, MD ED   02/23/2020 1535 03/03/2020 2123 Full Code [id number]  Rhetta Mura, DO ED   02/02/2020 1338 02/03/2020 2234 Full Code [id number]  Isaias Cowman, MD  Inpatient   01/24/2016 1724 01/26/2016 1324 Full Code [id number]  Schnier, Dolores Lory, MD Inpatient       Thank you for allowing the Palliative Medicine Team to assist in the care of this patient.    Asencion Gowda, NP  Please contact Palliative Medicine Team phone at [phone] for questions and concerns.

## 2021-11-03 NOTE — Progress Notes (Signed)
   11/03/21 0900  Clinical Encounter Type  Visited With Patient  Visit Type Social support  Referral From Nurse  Consult/Referral To [place] responded to [facility] consult to provide conversation and support to patient who has been in hospital for 35 days. Patient was responsive and engaging but at the same time tired.

## 2021-11-03 NOTE — TOC Progression Note (Signed)
Transition of Care (TOC) - Progression Note    Patient Details  Name: [name] MRN: [id number] Date of Birth: 08-Jul-1955  Transition of Care [facility]) CM/SW [place], [place] Phone Number: 11/03/2021, 3:57 PM  Clinical Narrative:     Per Delsa Sale with [facility] patient's insurance auth continues to pend for LTAC.  Expected Discharge Plan: [place] Barriers to Discharge: Continued Medical Work up  Expected Discharge Plan and Services Expected Discharge Plan: [place] In-house Referral: NA   Post Acute Care Choice: Resumption of Svcs/PTA Provider Living arrangements for the past 2 months: Single Family Home                                       Social Determinants of Health (SDOH) Interventions    Readmission Risk Interventions    09/29/2021   11:12 AM  Readmission Risk Prevention Plan  Transportation Screening Complete  PCP or Specialist Appt within 3-5 Days Complete  HRI or [place] Complete  Social Work Consult for [place] Planning/Counseling Complete  Palliative Care Screening Not Applicable  Medication Review Press photographer) Complete

## 2021-11-04 ENCOUNTER — Inpatient Hospital Stay: Payer: Medicare Other

## 2021-11-04 DIAGNOSIS — L89620 Pressure ulcer of left heel, unstageable: Secondary | ICD-10-CM

## 2021-11-04 DIAGNOSIS — R410 Disorientation, unspecified: Secondary | ICD-10-CM | POA: Diagnosis not present

## 2021-11-04 DIAGNOSIS — L89609 Pressure ulcer of unspecified heel, unspecified stage: Secondary | ICD-10-CM

## 2021-11-04 LAB — GLUCOSE, CAPILLARY
Glucose-Capillary: 108 mg/dL — ABNORMAL HIGH (ref 70–99)
Glucose-Capillary: 196 mg/dL — ABNORMAL HIGH (ref 70–99)
Glucose-Capillary: 223 mg/dL — ABNORMAL HIGH (ref 70–99)
Glucose-Capillary: 244 mg/dL — ABNORMAL HIGH (ref 70–99)

## 2021-11-04 LAB — BASIC METABOLIC PANEL
Anion gap: 5 (ref 5–15)
BUN: 33 mg/dL — ABNORMAL HIGH (ref 8–23)
CO2: 33 mmol/L — ABNORMAL HIGH (ref 22–32)
Calcium: 8.8 mg/dL — ABNORMAL LOW (ref 8.9–10.3)
Chloride: 100 mmol/L (ref 98–111)
Creatinine, Ser: 0.95 mg/dL (ref 0.61–1.24)
GFR, Estimated: >60 mL/min (ref >60)
Glucose, Bld: 92 mg/dL (ref 70–99)
Potassium: 4.4 mmol/L (ref 3.5–5.1)
Sodium: 138 mmol/L (ref 135–145)

## 2021-11-04 LAB — PHOSPHORUS: Phosphorus: 5.1 mg/dL — ABNORMAL HIGH (ref 2.5–4.6)

## 2021-11-04 LAB — MAGNESIUM: Magnesium: 1.7 mg/dL (ref 1.7–2.4)

## 2021-11-04 NOTE — Progress Notes (Signed)
[facility] Cardiology  SUBJECTIVE:  - No acute events.  - This morning he is sleeping, but awakes. Says he has no shortness of breath or pain.   Vitals:   11/04/21 0500 11/04/21 0600 11/04/21 0742 11/04/21 0751  BP:  (!) 158/93  (!) 136/98  Pulse:   (!) 120 (!) 118  Resp:  (!) '21 16 17  '$ Temp:    97.7 F (36.5 C)  TempSrc:    Axillary  SpO2: 100% 100% 93% 100%  Weight:      Height:         Intake/Output Summary (Last 24 hours) at 11/04/2021 8242 Last data filed at 11/04/2021 0500 Gross per 24 hour  Intake 800 ml  Output 5900 ml  Net -5100 ml     PHYSICAL EXAM  General: Awake and alert HEENT:  Normocephalic and atramatic Neck:  No JVD.  Lungs: Clear bilaterally to auscultation without appreciable crackles or wheezes Heart: Tachycardic but regular . Normal S1 and S2 without gallops or murmurs.  Abdomen: morbidly obese appearing Msk:  Woody induration of RLE, wrapped in kerlix. S/p L BKA Extremities: moves all extremities spontaneously, Left BKA Neuro: No focal deficits.  Psych:  Seems appropriate    LABS: Basic Metabolic Panel: Recent Labs    11/03/21 0351 11/04/21 0455  NA 138 138  K 5.4* 4.4  CL 103 100  CO2 31 33*  GLUCOSE 142* 92  BUN 29* 33*  CREATININE 0.72 0.95  CALCIUM 8.4* 8.8*  MG 1.9 1.7  PHOS 4.3 5.1*    Liver Function Tests: No results for input(s): "AST", "ALT", "ALKPHOS", "BILITOT", "PROT", "ALBUMIN" in the last 72 hours.  No results for input(s): "LIPASE", "AMYLASE" in the last 72 hours. CBC: Recent Labs    11/02/21 0400 11/03/21 0351  WBC 8.9 8.9  HGB 9.4* 9.5*  HCT 33.3* 33.0*  MCV 96.0 95.4  PLT 333 352    Cardiac Enzymes: No results for input(s): "CKTOTAL", "CKMB", "CKMBINDEX", "TROPONINI" in the last 72 hours. BNP: Invalid input(s): "POCBNP" D-Dimer: No results for input(s): "DDIMER" in the last 72 hours. Hemoglobin A1C: No results for input(s): "HGBA1C" in the last 72 hours. Fasting Lipid Panel: No results for input(s):  "CHOL", "HDL", "LDLCALC", "TRIG", "CHOLHDL", "LDLDIRECT" in the last 72 hours. Thyroid Function Tests: No results for input(s): "TSH", "T4TOTAL", "T3FREE", "THYROIDAB" in the last 72 hours.  Invalid input(s): "FREET3"  Anemia Panel: No results for input(s): "VITAMINB12", "FOLATE", "FERRITIN", "TIBC", "IRON", "RETICCTPCT" in the last 72 hours.   DG Knee Left Port  Result Date: 11/04/2021 CLINICAL DATA:  Fall from bed, evaluate for fracture EXAM: PORTABLE LEFT KNEE - 1-2 VIEW COMPARISON:  None Available. FINDINGS: Below the knee amputation. Subcutaneous reticulation is generalized-usually chronic. Dystrophic and atheromatous calcifications. No acute fracture or dislocation. IMPRESSION: No acute finding. Electronically Signed   By: Jorje Guild M.D.   On: 11/04/2021 05:16     Echo LVEF 55-60% 10/05/2021  TELEMETRY: Atrial flutter 116 bpm:  ASSESSMENT AND PLAN:  Principal Problem:   Acute delirium Active Problems:   Essential hypertension, benign   RLS (restless legs syndrome)   Osteomyelitis (HCC)   Obesity, Class III, BMI 40-49.9 (morbid obesity) (HCC)   Acute on chronic diastolic CHF (congestive heart failure) (HCC)   AF (paroxysmal atrial fibrillation) ([place])   Uncontrolled type 2 diabetes mellitus with hypoglycemia, with long-term current use of insulin (HCC)   Malnutrition of moderate degree   Generalized weakness   Acute respiratory failure with hypoxia and hypercapnia (  HCC)   OSA (obstructive sleep apnea)   Sepsis due to methicillin resistant Staphylococcus aureus (MRSA) (HCC)   Acute on chronic respiratory failure with hypoxia and hypercapnia (HCC)   Acute urinary retention   Hyperkalemia   Iron deficiency anemia   UTI (urinary tract infection)    1. Acute on chronic diastolic congestive heart failure, HFpEF, LVEF 55-60% 10/05/21, good diuresis after IV furosemide but remains on supplemental O2 2.  Paroxysmal atrial fibrillation/atrial flutter, currently in atrial  flutter heart rate 116 bpm, on amiodarone for rate control and Eliquis for stroke prevention (home rate control diltiazem '180mg'$  and metoprolol tartrate '100mg'$  BID) 3.  Coronary artery disease, status post CABG 2006, PCI RCA 01/2020, currently without chest pain 4.  Left foot osteomyelitis s/p IV linezolid, left BKA 10/03/2021 by Dr. Delana Meyer 5.  Acute kidney injury, improving to Cr 0.77 and >60  6.  MRSA bacteremia, osteomyelitis of L foot likely source, treated with IV daptomycin, repeat echo ordered by ID, resulted with thickened aortic and mitral leaflets without obvious vegetation.      Recommendations   1.  Agree with current therapy 2.  Continue Eliquis for stroke prevention 3.  Continue amiodarone 200 mg daily for rate control.  - Continue diltiazem 90 q12; may need to increase based on BP and HR (home dose much higher) - Holding metoprolol- may need to resume 4.  Diuresis as needed.s/p torsemide 60 mg PO 7/7 with >> 4 L net negative. 5.  Wean oxygen as tolerated 6.  Given prolonged hospital course with ongoing clinical decline, prognosis seems to be poor. Defer to primary team.   -pending rehab vs LTAC placement    Andrez Grime, MD 11/04/2021 8:11 AM

## 2021-11-04 NOTE — Progress Notes (Signed)
PROGRESS NOTE    [name].  OIB:[id number] DOB: [birth year]  DOA: 09/28/2021 Date of Service: 11/04/21 PCP: [name], MD     Hospital Course:  Alec Less. is a 66 y.o. male with medical history significant for CHF with last known LVEF of 50 to 55%, HTN, CAD, MDD, COPD, A-fib, morbid obesity (BMI 77), OSA, status post recent wound debridement and partial calcanectomy (05/16) on oral antibiotic therapy after completion of IV. He presented to the ER 09/28/2021 for bilateral lower extremity swelling, increased redness and swelling involving the left lower extremity mostly over the heel. Admitted 09/28/2021  06/1: Pt admitted to the [facility] unit with acute on chronic diastolic CHF and left heel osteomyelitis  06/2: Podiatry consulted for left foot osteomyelitis, plan for higher amputation.  Vascular surgery consulted  06/4: ID consulted for MRSA bacteremia likely source left heel wound  06/6: Pt underwent left BKA per vascular surgery  06/6 to 06/17: Pt with worsening acute on chronic hypoxic respiratory failure secondary to volume overload due to diastolic CHF.  CXR revealed new pleural effusion.  IV lasix resumed.  Requiring Bipap or [place]  06/21 to 06/27: mentation waxed and waned requiring Bipap during the day due to hypoxia  06/27: Pt requiring 100% HHFNC and transfer to the stepdown unit.  PCCM team consulted to assist with management  6/28: Pt tolerating [place] @ 50L/85%; Underwent Right-sided thoracentesis with removal of 1.2L of pleural fluid 6/29: Tolerating 45L/70% FiO2 with O2 sats 100%; remains on levophed gtt '@4'$  mcg/min to maintain map >65 6/30: altered mental status, but not from hypercapnia  7/1: Pt with intermittent agitation with worsening delirium that started 6/20.  Received prn seroquel and haldol overnight due to sleep deprivation.  This am pt calm with improvement in mentation  7/2: Pt lethargic after receiving prn haldol x1 dose and seroquel x1  dose.  Tolerating Bipap '@30'$ % FiO2.  If pt remains stable throughout the day will transfer service to [facility] starting 07/3 and PCCM team will sign off  7/3-7/4: supplemental oxygen and currently on bubbler at 10L. Can be transferred to PCU. Mental status has much improved today. PT/OT will likely need to come re-evaluate but will likely need LTAC vs SNF. CM is working on this. 7/5: still requiring 12L O2 this AM but --> 5L later in the day. 7/6: down to 4L [name] in the day, still on BiPap qhs/when asleep. PT/OT recommending SNF/LTAC, TOC working on LTAC but given O2 requirement have improved he may be able to go to SNF, TOC will look into this. Increased beta blocker given tachycardia. Cardiology also following.   7/7: potassium has been slightly elevated, worsening today. Restarted torsemide. D/w pharmacy re: risk QT prolongation w/ BB, amiodarone, antipsychotics - appreciate cardiology input: ok to d/c midodrine, restart diltiazem and d/c metoprolol, restart loop diuretic 7/8: . K is improved/down to WNL. Cr WNL, BUN trended up. Concern he is volume contracted, cardio d/c torsemide. HR up will consider increase diltiazem but BP occasionally on the low side - defer to cardiology. HR remains elevated but otherwise he is approaching medical stability and TOC is working on placement.      Consultants:  Podiatry Vascular Surgery Infectious Disease Cardiology Palliative Care   Procedures: 10/03/21: left BKA per vascular surgery    Subjective: PT reports feeling okay today, he really wants to be out of the hospital. No CP/SOB.      ASSESSMENT & PLAN:   Principal Problem:  Acute delirium Active Problems:   Sepsis due to methicillin resistant Staphylococcus aureus (MRSA) (HCC)   Acute on chronic respiratory failure with hypoxia and hypercapnia (HCC)   UTI (urinary tract infection)   Acute on chronic diastolic CHF (congestive heart failure) ([place])   Uncontrolled type 2 diabetes mellitus with  hypoglycemia, with long-term current use of insulin (HCC)   AF (paroxysmal atrial fibrillation) (HCC)   Obesity, Class III, BMI 40-49.9 (morbid obesity) ([place])   Essential hypertension, benign   Osteomyelitis (HCC)   RLS (restless legs syndrome)   Acute urinary retention   Malnutrition of moderate degree   Generalized weakness   Acute respiratory failure with hypoxia and hypercapnia (HCC)   OSA (obstructive sleep apnea)   Hyperkalemia   Iron deficiency anemia   Pressure injury of skin of heel  Sepsis POA, resolved: due to methicillin resistant Staphylococcus aureus. RESOLVED Blood cxs (+)MRSA. Repeat blood cx NGTD, Echo no vegetations on TTE, unable to to TEE. Likely secondary to osteomyelitis. Continue on linezolid (was on daptomycin but concern for eosinophilia so plan for linezolid until 11/01/21. Poor prognosis. High risk for intubation & cardiac arrest. See infectious disease notes 07/03: ID signed off    Acute hypoxic/hypercapnic  resp failure- combination of CHF, hypoventilation syndrome,OSA , CO2 retention. IMPROVED/STABLE  Ruled out amiodarone induced pneumonitis and dapto induced eosinophilic pneumonia.  BiPAP qhs / while asleep   Hyperkalemia - IMPROVED  potassium has been slightly elevated, worsening 7/7.  Stopped beta-blocker due to potential this may be contributing to hyperkalemia, started diltiazem, added back torsemide.  Trend BMP in a.m. --> improved, torsemide d/c 7/8  UTI - RESOLVED: urine cx (+)citrobacter. Completed abx course 3 days cefepime.     Acute delirium -RESOLVED: labile mental status. Continue on risperidone. Seroquel qhs prn     Acute on chronic diastolic CHF: EF 55 to 96% 04/2021 - RESOLVED/STABLE Cardiology following. Has been relatively well-compensated past few days.    DM2: poorly controlled, HbA1c 10.2. Continue on glargine, aspart, SSI w/ accuchecks   PAF: continue on eliquis, amio.  Restarted metoprolol given tachycardia and BP has been better  however discontinued this due to concerns for mild but worsening hyperkalemia.  Placed on diltiazem. Cardio following. Previously was on pressors but since have been weaned off   Morbid obesity: BMI 41.4. Complicates overall care & prognosis    Osteomyelitis of LLE: s/p L BLKA. completed linezolid 11/01/21 as per ID    HTN: continue on amidoarone, has been able to restart beta-blocker but had to DC this due to hyperkalemia, he is on Cardizem now    RLS: holding home dose of mirapex   Acute urinary retention: foley catheter placed on 10/08/2021.  Foley DC'd on 6/16.  Foley needed to be replaced on 10/24/2021   IDA: H&H had been labile. No need for a tranfusion    OSA: CPAP qhs. Qualifies for NIV with elevated PCO2 on ABG.    Generalized weakness: PT/OT will have to re-evaluate pt   Malnutrition of moderate degree: related to acute illness (left heel osteomyelitis) and energy intake of less than 75% for more than 7 days, mild fat depletion.  Continue on nutritional supplements           DVT prophylaxis: Eliquis Code Status: FULL Family Communication: will call wife later today and update this note if able to reach her  Disposition Plan / TOC needs: likely will need d/c to SNF/LTAC Barriers to discharge / significant pending items: requiring high supplemental  O2, may need LTAC vs SNF              Objective: Vitals:   11/04/21 0500 11/04/21 0600 11/04/21 0742 11/04/21 0751  BP:  (!) 158/93  (!) 136/98  Pulse:   (!) 120 (!) 118  Resp:  (!) '21 16 17  '$ Temp:    97.7 F (36.5 C)  TempSrc:    Axillary  SpO2: 100% 100% 93% 100%  Weight:      Height:        Intake/Output Summary (Last 24 hours) at 11/04/2021 9476 Last data filed at 11/04/2021 0500 Gross per 24 hour  Intake 800 ml  Output 5900 ml  Net -5100 ml    Filed Weights   10/27/21 0500 10/28/21 0500 11/03/21 0350  Weight: (!) 155.7 kg (!) 154.4 kg (!) 167.8 kg    Examination:  Constitutional:  VS as  above General Appearance: alert, well-developed, well-nourished, NAD Eyes: Normal lids and conjunctive, non-icteric sclera Neck: No masses, trachea midline Respiratory: Normal respiratory effort Breath sounds normal, no wheeze/rhonchi/rales - limited exam pt declined to roll over or sit up  Cardiovascular: S1/S2 normal Trace lower extremity edema RLE Gastrointestinal: Nontender, no masses but habitus limits exam.  Musculoskeletal:  No clubbing/cyanosis of digits Left BKA Neurological: No cranial nerve deficit on limited exam Psychiatric: Normal judgment/insight Depressed/flat mood and affect       Scheduled Medications:   amiodarone  200 mg Oral Daily   apixaban  5 mg Oral BID   vitamin C  500 mg Oral Daily   aspirin EC  81 mg Oral Daily   budesonide (PULMICORT) nebulizer solution  0.5 mg Nebulization BID   chlorhexidine  15 mL Mouth Rinse BID   Chlorhexidine Gluconate Cloth  6 each Topical Daily   cholecalciferol  1,000 Units Oral Daily   diltiazem  90 mg Oral Q12H   ezetimibe  10 mg Oral Daily   ferrous sulfate  325 mg Oral Q breakfast   finasteride  5 mg Oral Daily   insulin aspart  0-20 Units Subcutaneous TID WC   insulin aspart  10 Units Subcutaneous TID WC   insulin glargine-yfgn  25 Units Subcutaneous QHS   insulin glargine-yfgn  5 Units Subcutaneous Daily   ipratropium-albuterol  3 mL Nebulization BID   levothyroxine  25 mcg Oral Q0600   multivitamin with minerals  1 tablet Oral Daily   nystatin   Topical BID   primidone  250 mg Oral BID   Ensure Max Protein  11 oz Oral BID   risperiDONE  0.5 mg Oral BID   sodium chloride flush  10-40 mL Intracatheter Q12H   vitamin B-12  1,000 mcg Oral Daily    Continuous Infusions:  sodium chloride Stopped (10/25/21 2209)    PRN Medications:  acetaminophen **OR** acetaminophen, albuterol, haloperidol lactate, metoprolol tartrate, nitroGLYCERIN, ondansetron **OR** ondansetron (ZOFRAN) IV, mouth rinse, QUEtiapine,  sodium chloride flush  Antimicrobials:  Anti-infectives (From admission, onward)    Start     Dose/Rate Route Frequency Ordered Stop   10/26/21 2200  linezolid (ZYVOX) tablet 600 mg        600 mg Oral Every 12 hours 10/26/21 1743 11/01/21 2139   10/24/21 2200  linezolid (ZYVOX) IVPB 600 mg  Status:  Discontinued        600 mg 300 mL/hr over 60 Minutes Intravenous Every 12 hours 10/24/21 2157 10/26/21 1743   10/24/21 1800  ceFEPIme (MAXIPIME) 2 g in sodium chloride 0.9 % 100  mL IVPB        2 g 200 mL/hr over 30 Minutes Intravenous Every 8 hours 10/24/21 1612 10/26/21 1730   10/24/21 1030  sulfamethoxazole-trimethoprim (BACTRIM DS) 800-160 MG per tablet 1 tablet  Status:  Discontinued        1 tablet Oral Every 12 hours 10/24/21 0944 10/24/21 1608   10/23/21 2000  DAPTOmycin (CUBICIN) 800 mg in sodium chloride 0.9 % IVPB  Status:  Discontinued        6 mg/kg  129.2 kg (Adjusted) 132 mL/hr over 30 Minutes Intravenous Every 24 hours 10/23/21 0745 10/24/21 1803   10/22/21 0945  cefTRIAXone (ROCEPHIN) 1 g in sodium chloride 0.9 % 100 mL IVPB        1 g 200 mL/hr over 30 Minutes Intravenous Every 24 hours 10/22/21 0848 10/24/21 1845   10/04/21 1000  DAPTOmycin (CUBICIN) 800 mg in sodium chloride 0.9 % IVPB  Status:  Discontinued        6 mg/kg  129.2 kg (Adjusted) 132 mL/hr over 30 Minutes Intravenous Every 24 hours 10/03/21 1648 10/23/21 0745   10/03/21 0000  vancomycin (VANCOREADY) IVPB 1500 mg/300 mL        1,500 mg 150 mL/hr over 120 Minutes Intravenous Every 12 hours 10/02/21 1348 10/03/21 2359   09/30/21 1330  cefTRIAXone (ROCEPHIN) 2 g in sodium chloride 0.9 % 100 mL IVPB  Status:  Discontinued        2 g 200 mL/hr over 30 Minutes Intravenous Daily 09/30/21 1233 10/03/21 1849   09/30/21 1200  vancomycin (VANCOREADY) IVPB 1500 mg/300 mL  Status:  Discontinued        1,500 mg 150 mL/hr over 120 Minutes Intravenous Every 12 hours 09/30/21 0120 09/30/21 0927   09/30/21 1200   vancomycin (VANCOREADY) IVPB 1250 mg/250 mL  Status:  Discontinued        1,250 mg 166.7 mL/hr over 90 Minutes Intravenous Every 12 hours 09/30/21 0927 10/02/21 1348   09/30/21 0000  vancomycin (VANCOREADY) IVPB 1250 mg/250 mL       See Hyperspace for full Linked Orders Report.   1,250 mg 166.7 mL/hr over 90 Minutes Intravenous  Once 09/29/21 2308 09/30/21 0538   09/30/21 0000  vancomycin (VANCOREADY) IVPB 1250 mg/250 mL       See Hyperspace for full Linked Orders Report.   1,250 mg 166.7 mL/hr over 90 Minutes Intravenous  Once 09/29/21 2308 09/30/21 0255   09/29/21 0200  cefTRIAXone (ROCEPHIN) 2 g in sodium chloride 0.9 % 100 mL IVPB  Status:  Discontinued       See Hyperspace for full Linked Orders Report.   2 g 200 mL/hr over 30 Minutes Intravenous Every 24 hours 09/28/21 2051 09/29/21 2309   09/29/21 0200  metroNIDAZOLE (FLAGYL) IVPB 500 mg  Status:  Discontinued       See Hyperspace for full Linked Orders Report.   500 mg 100 mL/hr over 60 Minutes Intravenous Every 12 hours 09/28/21 2051 10/03/21 1849   09/28/21 1830  vancomycin (VANCOCIN) IVPB 1000 mg/200 mL premix        1,000 mg 200 mL/hr over 60 Minutes Intravenous  Once 09/28/21 1826 09/28/21 1937   09/28/21 1745  piperacillin-tazobactam (ZOSYN) IVPB 3.375 g        3.375 g 100 mL/hr over 30 Minutes Intravenous  Once 09/28/21 1736 09/28/21 1838       Data Reviewed: I have personally reviewed following labs and imaging studies  CBC: Recent Labs  Lab 10/29/21  9381 10/30/21 0338 11/01/21 0452 11/02/21 0400 11/03/21 0351  WBC 7.1 8.9  8.9 9.1 8.9 8.9  NEUTROABS  --  5.4  --   --   --   HGB 8.8* 9.2*  9.2* 9.3* 9.4* 9.5*  HCT 30.3* 31.6*  31.3* 32.6* 33.3* 33.0*  MCV 94.1 94.6  92.9 96.2 96.0 95.4  PLT 240 302  272 305 333 829    Basic Metabolic Panel: Recent Labs  Lab 10/31/21 0523 11/01/21 0452 11/02/21 0400 11/03/21 0351 11/04/21 0455  NA 140 139 136 138 138  K 4.9 5.2* 5.2* 5.4* 4.4  CL 105 104  101 103 100  CO2 '31 30 30 31 '$ 33*  GLUCOSE 116* 145* 156* 142* 92  BUN '15 21 23 '$ 29* 33*  CREATININE 0.70 0.88 0.71 0.72 0.95  CALCIUM 8.4* 8.6* 8.4* 8.4* 8.8*  MG 1.9 1.9 1.8 1.9 1.7  PHOS 4.1 3.6 3.7 4.3 5.1*    GFR: Estimated Creatinine Clearance: 129 mL/min (by C-G formula based on SCr of 0.95 mg/dL). Liver Function Tests: Recent Labs  Lab 10/30/21 0338 10/31/21 0523 11/01/21 0452  AST 25 21 14*  ALT 46* 37 31  ALKPHOS 321* 297* 289*  BILITOT 0.5 0.6 0.5  PROT 6.5 6.2* 6.8  ALBUMIN 2.3* 2.3* 2.4*    No results for input(s): "LIPASE", "AMYLASE" in the last 168 hours. No results for input(s): "AMMONIA" in the last 168 hours. Coagulation Profile: No results for input(s): "INR", "PROTIME" in the last 168 hours. Cardiac Enzymes: No results for input(s): "CKTOTAL", "CKMB", "CKMBINDEX", "TROPONINI" in the last 168 hours. BNP (last 3 results) No results for input(s): "PROBNP" in the last 8760 hours. HbA1C: No results for input(s): "HGBA1C" in the last 72 hours. CBG: Recent Labs  Lab 11/03/21 0814 11/03/21 1123 11/03/21 1552 11/03/21 2116 11/04/21 0811  GLUCAP 146* 218* 172* 121* 108*    Lipid Profile: No results for input(s): "CHOL", "HDL", "LDLCALC", "TRIG", "CHOLHDL", "LDLDIRECT" in the last 72 hours. Thyroid Function Tests: No results for input(s): "TSH", "T4TOTAL", "FREET4", "T3FREE", "THYROIDAB" in the last 72 hours. Anemia Panel: No results for input(s): "VITAMINB12", "FOLATE", "FERRITIN", "TIBC", "IRON", "RETICCTPCT" in the last 72 hours. Urine analysis:    Component Value Date/Time   COLORURINE YELLOW (A) 10/21/2021 1318   APPEARANCEUR CLOUDY (A) 10/21/2021 1318   LABSPEC 1.008 10/21/2021 1318   PHURINE 6.0 10/21/2021 1318   GLUCOSEU NEGATIVE 10/21/2021 1318   HGBUR LARGE (A) 10/21/2021 1318   BILIRUBINUR NEGATIVE 10/21/2021 [place] 10/21/2021 1318   PROTEINUR 100 (A) 10/21/2021 1318   NITRITE NEGATIVE 10/21/2021 1318    LEUKOCYTESUR LARGE (A) 10/21/2021 1318   Sepsis Labs: '@LABRCNTIP'$ (procalcitonin:4,lacticidven:4)  Recent Results (from the past 240 hour(s))  Body fluid culture w Gram Stain     Status: None   Collection Time: 10/25/21  2:10 PM   Specimen: PATH Cytology Pleural fluid  Result Value Ref Range Status   Specimen Description   Final    PLEURAL Performed at [facility], [address], [place], [place] [address]    Special Requests   Final    NONE Performed at [facility], [address] [place], [place] [address]    Gram Stain   Final    RARE WBC PRESENT, PREDOMINANTLY PMN NO ORGANISMS SEEN    Culture   Final    NO GROWTH 3 DAYS Performed at [facility], [place] [address] [place], [place] [address]    Report Status 10/29/2021  FINAL  Final         Radiology Studies last 96 hours: DG Knee Right Port  Result Date: 11/04/2021 CLINICAL DATA:  [place] out of bed, evaluate for fracture EXAM: PORTABLE RIGHT KNEE - 1-2 VIEW COMPARISON:  None Available. FINDINGS: There is no evidence of acute fracture. Alignment is normal. There is mild tricompartment osteoarthritis. There is a trace joint effusion. There is mild prepatellar soft tissue swelling. There is a vascular stent noted posteriorly. There is chronic appearing cortical thickening of the tibial diaphysis, partially visualized. IMPRESSION: No evidence of acute fracture. Mild prepatellar soft tissue swelling. Mild tricompartment osteoarthritis.  Trace joint effusion. Electronically Signed   By: Maurine Simmering M.D.   On: 11/04/2021 08:42   DG Knee Left Port  Result Date: 11/04/2021 CLINICAL DATA:  Fall from bed, evaluate for fracture EXAM: PORTABLE LEFT KNEE - 1-2 VIEW COMPARISON:  None Available. FINDINGS: Below the knee amputation. Subcutaneous reticulation is generalized-usually chronic. Dystrophic and atheromatous calcifications. No acute fracture or dislocation. IMPRESSION: No acute finding. Electronically Signed    By: Jorje Guild M.D.   On: 11/04/2021 05:16            LOS: 39 days       Emeterio Reeve, DO [facility] 11/04/2021, 9:26 AM   Staff may message me via secure chat in [place]  but this may not receive immediate response,  please page for urgent matters!  If 7PM-7AM, please contact night-coverage [email or web address]  Dictation software was used to generate the above note. Typos may occur and escape review, as with typed/written notes. Please contact Dr Sheppard Coil directly for clarity if needed.

## 2021-11-04 NOTE — Progress Notes (Addendum)
This nurse was coming back from break and found this patient with half of his body still on the bed and knees about to touch the floor. This nurse plus two other staff safely brought the patient's knees on the floor and carry the pt back on the bed. [place] bed alarm put in place.

## 2021-11-05 DIAGNOSIS — R410 Disorientation, unspecified: Secondary | ICD-10-CM | POA: Diagnosis not present

## 2021-11-05 LAB — CBC
HCT: 32 % — ABNORMAL LOW (ref 39.0–52.0)
Hemoglobin: 9.5 g/dL — ABNORMAL LOW (ref 13.0–17.0)
MCH: 27.8 pg (ref 26.0–34.0)
MCHC: 29.7 g/dL — ABNORMAL LOW (ref 30.0–36.0)
MCV: 93.6 fL (ref 80.0–100.0)
Platelets: 332 10*3/uL (ref 150–400)
RBC: 3.42 MIL/uL — ABNORMAL LOW (ref 4.22–5.81)
RDW: 17.2 % — ABNORMAL HIGH (ref 11.5–15.5)
WBC: 7.2 10*3/uL (ref 4.0–10.5)
nRBC: 0 % (ref 0.0–0.2)

## 2021-11-05 LAB — BASIC METABOLIC PANEL
Anion gap: 6 (ref 5–15)
BUN: 32 mg/dL — ABNORMAL HIGH (ref 8–23)
CO2: 33 mmol/L — ABNORMAL HIGH (ref 22–32)
Calcium: 8.3 mg/dL — ABNORMAL LOW (ref 8.9–10.3)
Chloride: 99 mmol/L (ref 98–111)
Creatinine, Ser: 0.75 mg/dL (ref 0.61–1.24)
GFR, Estimated: >60 mL/min (ref >60)
Glucose, Bld: 168 mg/dL — ABNORMAL HIGH (ref 70–99)
Potassium: 4.7 mmol/L (ref 3.5–5.1)
Sodium: 138 mmol/L (ref 135–145)

## 2021-11-05 LAB — GLUCOSE, CAPILLARY
Glucose-Capillary: 186 mg/dL — ABNORMAL HIGH (ref 70–99)
Glucose-Capillary: 173 mg/dL — ABNORMAL HIGH (ref 70–99)
Glucose-Capillary: 169 mg/dL — ABNORMAL HIGH (ref 70–99)
Glucose-Capillary: 325 mg/dL — ABNORMAL HIGH (ref 70–99)

## 2021-11-05 LAB — MAGNESIUM: Magnesium: 1.9 mg/dL (ref 1.7–2.4)

## 2021-11-05 LAB — PHOSPHORUS: Phosphorus: 4.1 mg/dL (ref 2.5–4.6)

## 2021-11-05 NOTE — Progress Notes (Signed)
PROGRESS NOTE    [name].  GEX:[id number] DOB: 1956/01/06  DOA: 09/28/2021 Date of Service: 11/05/21 PCP: Alec Crouch, MD     Hospital Course:  Alec Less. is a 66 y.o. male with medical history significant for CHF with last known LVEF of 50 to 55%, HTN, CAD, MDD, COPD, A-fib, morbid obesity (BMI 56), OSA, status post recent wound debridement and partial calcanectomy (05/16) on oral antibiotic therapy after completion of IV. He presented to the ER 09/28/2021 for bilateral lower extremity swelling, increased redness and swelling involving the left lower extremity mostly over the heel. Admitted 09/28/2021  06/1: Pt admitted to the [facility] unit with acute on chronic diastolic CHF and left heel osteomyelitis  06/2: Podiatry consulted for left foot osteomyelitis, plan for higher amputation.  Vascular surgery consulted  06/4: ID consulted for MRSA bacteremia likely source left heel wound  06/6: Pt underwent left BKA per vascular surgery  06/6 to 06/17: Pt with worsening acute on chronic hypoxic respiratory failure secondary to volume overload due to diastolic CHF.  CXR revealed new pleural effusion.  IV lasix resumed.  Requiring Bipap or [place]  06/21 to 06/27: mentation waxed and waned requiring Bipap during the day due to hypoxia  06/27: Pt requiring 100% HHFNC and transfer to the stepdown unit.  PCCM team consulted to assist with management  6/28: Pt tolerating [place] @ 50L/85%; Underwent Right-sided thoracentesis with removal of 1.2L of pleural fluid 6/29: Tolerating 45L/70% FiO2 with O2 sats 100%; remains on levophed gtt '@4'$  mcg/min to maintain map >65 6/30: altered mental status, but not from hypercapnia  7/1: Pt with intermittent agitation with worsening delirium that started 6/20.  Received prn seroquel and haldol overnight due to sleep deprivation.  This am pt calm with improvement in mentation  7/2: Pt lethargic after receiving prn haldol x1 dose and seroquel x1  dose.  Tolerating Bipap '@30'$ % FiO2.  If pt remains stable throughout the day will transfer service to [facility] starting 07/3 and PCCM team will sign off  7/3-7/4: supplemental oxygen and currently on bubbler at 10L. Can be transferred to PCU. Mental status has much improved today. PT/OT will likely need to come re-evaluate but will likely need LTAC vs SNF. CM is working on this. 7/5: still requiring 12L O2 this AM but --> 5L later in the day. 7/6: down to 4L [place] in the day, still on BiPap qhs/when asleep. PT/OT recommending SNF/LTAC, TOC working on LTAC but given O2 requirement have improved he may be able to go to SNF, TOC will look into this. Increased beta blocker given tachycardia. Cardiology also following.   7/7: potassium has been slightly elevated, worsening today. Restarted torsemide. D/w pharmacy re: risk QT prolongation w/ BB, amiodarone, antipsychotics - appreciate cardiology input: ok to d/c midodrine, restart diltiazem and d/c metoprolol, restart loop diuretic 7/8-7/9: . K is improved/down to WNL. Cr WNL, BUN trended up. Concern he is volume contracted, cardio d/c torsemide 7/8. Cardio recs: amiodarone 200 gm daily, diltiazem 90 q12 may need to increase, metoprolol held (was increased d/t HR, concern for high K and since holding this K has improved), diuresis as needed w/ torsemide which is held for now. HR remains elevated but otherwise he is approaching medical stability and TOC is working on placement.     Consultants:  Podiatry Vascular Surgery Infectious Disease Cardiology Palliative Care   Procedures: 10/03/21: left BKA per vascular surgery    Subjective: PT reports feeling okay today, he  repeat as yesterday that really wants to be out of the hospital. No CP/SOB.      ASSESSMENT & PLAN:   Principal Problem:   Acute delirium Active Problems:   Sepsis due to methicillin resistant Staphylococcus aureus (MRSA) ([place])   Acute on chronic respiratory failure with hypoxia and  hypercapnia (HCC)   UTI (urinary tract infection)   Acute on chronic diastolic CHF (congestive heart failure) ([place])   Uncontrolled type 2 diabetes mellitus with hypoglycemia, with long-term current use of insulin (HCC)   AF (paroxysmal atrial fibrillation) (HCC)   Obesity, Class III, BMI 40-49.9 (morbid obesity) ([place])   Essential hypertension, benign   Osteomyelitis (HCC)   RLS (restless legs syndrome)   Acute urinary retention   Malnutrition of moderate degree   Generalized weakness   Acute respiratory failure with hypoxia and hypercapnia (HCC)   OSA (obstructive sleep apnea)   Hyperkalemia   Iron deficiency anemia   Pressure injury of skin of heel  Sepsis POA, resolved: due to methicillin resistant Staphylococcus aureus. RESOLVED Blood cxs (+)MRSA. Repeat blood cx NGTD, Echo no vegetations on TTE, unable to to TEE. Likely secondary to osteomyelitis. Continue on linezolid (was on daptomycin but concern for eosinophilia so plan for linezolid until 11/01/21. Poor prognosis. High risk for intubation & cardiac arrest. See infectious disease notes 07/03: ID signed off    Acute hypoxic/hypercapnic  resp failure- combination of CHF, hypoventilation syndrome,OSA , CO2 retention. IMPROVED/STABLE  Ruled out amiodarone induced pneumonitis and dapto induced eosinophilic pneumonia.  BiPAP qhs / while asleep    Acute on chronic diastolic CHF: EF 55 to 06% 04/2021 - RESOLVED/STABLE Cardiology following. Has been relatively well-compensated past few days.  Cardio recs: amiodarone 200 gm daily, diltiazem 90 q12 may need to increase, metoprolol held (was increased d/t HR, concern for high K and since holding this K has improved), diuresis as needed w/ torsemide which is held for now.   Tachycardia and occasional elevated RR = SIRS, sepsis r/o HR remains elevated but otherwise he is approaching medical stability and TOC is working on placement.   Hyperkalemia - IMPROVED/RESOLVED potassium has been slightly  elevated, worsening 7/7.  Stopped beta-blocker due to potential this may be contributing to hyperkalemia, started diltiazem, added back torsemide.  Trend BMP in a.m. --> improved, torsemide d/c 7/8  PAF:  HTN:  continue on eliquis, amio, diltiazem.  Restarted metoprolol given tachycardia and BP has been better however discontinued this due to concerns for mild but worsening hyperkalemia.  Placed on diltiazem. Cardio following.  Ideally would increase diltiazem but BP has been low Previously was on pressors but since have been weaned off  UTI - RESOLVED:  urine cx (+)citrobacter. Completed abx course 3 days cefepime.     Acute delirium -RESOLVED:  labile mental status. Continue on risperidone. Seroquel qhs prn     DM2:  poorly controlled, HbA1c 10.2. Continue on glargine, aspart, SSI w/ accuchecks   Morbid obesity:  BMI 41.4. Complicates overall care & prognosis    Osteomyelitis of LLE: s/p L BLKA.  Completed linezolid 11/01/21 as per ID    RLS: holding home dose of mirapex   Acute urinary retention: foley catheter placed on 10/08/2021.  Foley DC'd on 6/16.  Foley needed to be replaced on 10/24/2021   IDA: H&H had been labile. No need for a tranfusion    OSA: CPAP qhs. Qualifies for NIV with elevated PCO2 on ABG.    Generalized weakness: PT/OT will have to re-evaluate pt  Malnutrition of moderate degree: related to acute illness (left heel osteomyelitis) and energy intake of less than 75% for more than 7 days, mild fat depletion.  Continue on nutritional supplements           DVT prophylaxis: Eliquis Code Status: FULL Family Communication: none at this time, will call if urgent updates (wife has expressed she is stressed w/ a lot of call from here and from pt so would like to be kept in the loop for major events / updates)  Disposition Plan / TOC needs: likely will need d/c to SNF/LTAC Barriers to discharge / significant pending items: requiring high supplemental O2, may need  LTAC vs SNF              Objective: Vitals:   11/05/21 0000 11/05/21 0400 11/05/21 0600 11/05/21 0735  BP: (!) 87/54 97/62 106/76   Pulse: (!) 119 (!) 118 (!) 114 (!) 118  Resp: '16 13 15 18  '$ Temp:      TempSrc:      SpO2: 94% 97% (!) 88% 96%  Weight:      Height:        Intake/Output Summary (Last 24 hours) at 11/05/2021 1051 Last data filed at 11/05/2021 0700 Gross per 24 hour  Intake 1560 ml  Output 1400 ml  Net 160 ml    Filed Weights   10/27/21 0500 10/28/21 0500 11/03/21 0350  Weight: (!) 155.7 kg (!) 154.4 kg (!) 167.8 kg    Examination:  Constitutional:  VS as above General Appearance: alert, well-developed, well-nourished, NAD Eyes: Normal lids and conjunctive, non-icteric sclera Neck: No masses, trachea midline Respiratory: Normal respiratory effort Breath sounds normal, no wheeze/rhonchi/rales - limited exam pt declined to roll over or sit up  Cardiovascular: S1/S2 normal Trace lower extremity edema RLE Gastrointestinal: Nontender, no masses but habitus limits exam.  Musculoskeletal:  No clubbing/cyanosis of digits Left BKA Neurological: No cranial nerve deficit on limited exam Psychiatric: Normal judgment/insight Depressed/flat mood and affect       Scheduled Medications:   amiodarone  200 mg Oral Daily   apixaban  5 mg Oral BID   vitamin C  500 mg Oral Daily   aspirin EC  81 mg Oral Daily   budesonide (PULMICORT) nebulizer solution  0.5 mg Nebulization BID   chlorhexidine  15 mL Mouth Rinse BID   Chlorhexidine Gluconate Cloth  6 each Topical Daily   cholecalciferol  1,000 Units Oral Daily   diltiazem  90 mg Oral Q12H   ezetimibe  10 mg Oral Daily   ferrous sulfate  325 mg Oral Q breakfast   finasteride  5 mg Oral Daily   insulin aspart  0-20 Units Subcutaneous TID WC   insulin aspart  10 Units Subcutaneous TID WC   insulin glargine-yfgn  25 Units Subcutaneous QHS   insulin glargine-yfgn  5 Units Subcutaneous Daily    ipratropium-albuterol  3 mL Nebulization BID   levothyroxine  25 mcg Oral Q0600   multivitamin with minerals  1 tablet Oral Daily   nystatin   Topical BID   primidone  250 mg Oral BID   Ensure Max Protein  11 oz Oral BID   risperiDONE  0.5 mg Oral BID   sodium chloride flush  10-40 mL Intracatheter Q12H   vitamin B-12  1,000 mcg Oral Daily    Continuous Infusions:  sodium chloride Stopped (10/25/21 2209)    PRN Medications:  acetaminophen **OR** acetaminophen, albuterol, haloperidol lactate, metoprolol tartrate, nitroGLYCERIN, ondansetron **  OR** ondansetron (ZOFRAN) IV, mouth rinse, QUEtiapine, sodium chloride flush  Antimicrobials:  Anti-infectives (From admission, onward)    Start     Dose/Rate Route Frequency Ordered Stop   10/26/21 2200  linezolid (ZYVOX) tablet 600 mg        600 mg Oral Every 12 hours 10/26/21 1743 11/01/21 2139   10/24/21 2200  linezolid (ZYVOX) IVPB 600 mg  Status:  Discontinued        600 mg 300 mL/hr over 60 Minutes Intravenous Every 12 hours 10/24/21 2157 10/26/21 1743   10/24/21 1800  ceFEPIme (MAXIPIME) 2 g in sodium chloride 0.9 % 100 mL IVPB        2 g 200 mL/hr over 30 Minutes Intravenous Every 8 hours 10/24/21 1612 10/26/21 1730   10/24/21 1030  sulfamethoxazole-trimethoprim (BACTRIM DS) 800-160 MG per tablet 1 tablet  Status:  Discontinued        1 tablet Oral Every 12 hours 10/24/21 0944 10/24/21 1608   10/23/21 2000  DAPTOmycin (CUBICIN) 800 mg in sodium chloride 0.9 % IVPB  Status:  Discontinued        6 mg/kg  129.2 kg (Adjusted) 132 mL/hr over 30 Minutes Intravenous Every 24 hours 10/23/21 0745 10/24/21 1803   10/22/21 0945  cefTRIAXone (ROCEPHIN) 1 g in sodium chloride 0.9 % 100 mL IVPB        1 g 200 mL/hr over 30 Minutes Intravenous Every 24 hours 10/22/21 0848 10/24/21 1845   10/04/21 1000  DAPTOmycin (CUBICIN) 800 mg in sodium chloride 0.9 % IVPB  Status:  Discontinued        6 mg/kg  129.2 kg (Adjusted) 132 mL/hr over 30 Minutes  Intravenous Every 24 hours 10/03/21 1648 10/23/21 0745   10/03/21 0000  vancomycin (VANCOREADY) IVPB 1500 mg/300 mL        1,500 mg 150 mL/hr over 120 Minutes Intravenous Every 12 hours 10/02/21 1348 10/03/21 2359   09/30/21 1330  cefTRIAXone (ROCEPHIN) 2 g in sodium chloride 0.9 % 100 mL IVPB  Status:  Discontinued        2 g 200 mL/hr over 30 Minutes Intravenous Daily 09/30/21 1233 10/03/21 1849   09/30/21 1200  vancomycin (VANCOREADY) IVPB 1500 mg/300 mL  Status:  Discontinued        1,500 mg 150 mL/hr over 120 Minutes Intravenous Every 12 hours 09/30/21 0120 09/30/21 0927   09/30/21 1200  vancomycin (VANCOREADY) IVPB 1250 mg/250 mL  Status:  Discontinued        1,250 mg 166.7 mL/hr over 90 Minutes Intravenous Every 12 hours 09/30/21 0927 10/02/21 1348   09/30/21 0000  vancomycin (VANCOREADY) IVPB 1250 mg/250 mL       See Hyperspace for full Linked Orders Report.   1,250 mg 166.7 mL/hr over 90 Minutes Intravenous  Once 09/29/21 2308 09/30/21 0538   09/30/21 0000  vancomycin (VANCOREADY) IVPB 1250 mg/250 mL       See Hyperspace for full Linked Orders Report.   1,250 mg 166.7 mL/hr over 90 Minutes Intravenous  Once 09/29/21 2308 09/30/21 0255   09/29/21 0200  cefTRIAXone (ROCEPHIN) 2 g in sodium chloride 0.9 % 100 mL IVPB  Status:  Discontinued       See Hyperspace for full Linked Orders Report.   2 g 200 mL/hr over 30 Minutes Intravenous Every 24 hours 09/28/21 2051 09/29/21 2309   09/29/21 0200  metroNIDAZOLE (FLAGYL) IVPB 500 mg  Status:  Discontinued       See Hyperspace for full  Linked Orders Report.   500 mg 100 mL/hr over 60 Minutes Intravenous Every 12 hours 09/28/21 2051 10/03/21 1849   09/28/21 1830  vancomycin (VANCOCIN) IVPB 1000 mg/200 mL premix        1,000 mg 200 mL/hr over 60 Minutes Intravenous  Once 09/28/21 1826 09/28/21 1937   09/28/21 1745  piperacillin-tazobactam (ZOSYN) IVPB 3.375 g        3.375 g 100 mL/hr over 30 Minutes Intravenous  Once 09/28/21 1736  09/28/21 1838       Data Reviewed: I have personally reviewed following labs and imaging studies  CBC: Recent Labs  Lab 10/30/21 0338 11/01/21 0452 11/02/21 0400 11/03/21 0351 11/05/21 0405  WBC 8.9  8.9 9.1 8.9 8.9 7.2  NEUTROABS 5.4  --   --   --   --   HGB 9.2*  9.2* 9.3* 9.4* 9.5* 9.5*  HCT 31.6*  31.3* 32.6* 33.3* 33.0* 32.0*  MCV 94.6  92.9 96.2 96.0 95.4 93.6  PLT 302  272 305 333 352 536    Basic Metabolic Panel: Recent Labs  Lab 11/01/21 0452 11/02/21 0400 11/03/21 0351 11/04/21 0455 11/05/21 0405  NA 139 136 138 138 138  K 5.2* 5.2* 5.4* 4.4 4.7  CL 104 101 103 100 99  CO2 '30 30 31 '$ 33* 33*  GLUCOSE 145* 156* 142* 92 168*  BUN 21 23 29* 33* 32*  CREATININE 0.88 0.71 0.72 0.95 0.75  CALCIUM 8.6* 8.4* 8.4* 8.8* 8.3*  MG 1.9 1.8 1.9 1.7 1.9  PHOS 3.6 3.7 4.3 5.1* 4.1    GFR: Estimated Creatinine Clearance: 153.1 mL/min (by C-G formula based on SCr of 0.75 mg/dL). Liver Function Tests: Recent Labs  Lab 10/30/21 0338 10/31/21 0523 11/01/21 0452  AST 25 21 14*  ALT 46* 37 31  ALKPHOS 321* 297* 289*  BILITOT 0.5 0.6 0.5  PROT 6.5 6.2* 6.8  ALBUMIN 2.3* 2.3* 2.4*    No results for input(s): "LIPASE", "AMYLASE" in the last 168 hours. No results for input(s): "AMMONIA" in the last 168 hours. Coagulation Profile: No results for input(s): "INR", "PROTIME" in the last 168 hours. Cardiac Enzymes: No results for input(s): "CKTOTAL", "CKMB", "CKMBINDEX", "TROPONINI" in the last 168 hours. BNP (last 3 results) No results for input(s): "PROBNP" in the last 8760 hours. HbA1C: No results for input(s): "HGBA1C" in the last 72 hours. CBG: Recent Labs  Lab 11/04/21 0811 11/04/21 1156 11/04/21 1614 11/04/21 2153 11/05/21 1024  GLUCAP 108* 196* 223* 244* 186*    Lipid Profile: No results for input(s): "CHOL", "HDL", "LDLCALC", "TRIG", "CHOLHDL", "LDLDIRECT" in the last 72 hours. Thyroid Function Tests: No results for input(s): "TSH", "T4TOTAL",  "FREET4", "T3FREE", "THYROIDAB" in the last 72 hours. Anemia Panel: No results for input(s): "VITAMINB12", "FOLATE", "FERRITIN", "TIBC", "IRON", "RETICCTPCT" in the last 72 hours. Urine analysis:    Component Value Date/Time   COLORURINE YELLOW (A) 10/21/2021 1318   APPEARANCEUR CLOUDY (A) 10/21/2021 1318   LABSPEC 1.008 10/21/2021 1318   PHURINE 6.0 10/21/2021 1318   GLUCOSEU NEGATIVE 10/21/2021 1318   HGBUR LARGE (A) 10/21/2021 1318   BILIRUBINUR NEGATIVE 10/21/2021 1318   KETONESUR NEGATIVE 10/21/2021 1318   PROTEINUR 100 (A) 10/21/2021 1318   NITRITE NEGATIVE 10/21/2021 1318   LEUKOCYTESUR LARGE (A) 10/21/2021 1318   Sepsis Labs: '@LABRCNTIP'$ (procalcitonin:4,lacticidven:4)  No results found for this or any previous visit (from the past 240 hour(s)).        Radiology Studies last 96 hours: DG Knee Right Port  Result  Date: 11/04/2021 CLINICAL DATA:  [place] out of bed, evaluate for fracture EXAM: PORTABLE RIGHT KNEE - 1-2 VIEW COMPARISON:  None Available. FINDINGS: There is no evidence of acute fracture. Alignment is normal. There is mild tricompartment osteoarthritis. There is a trace joint effusion. There is mild prepatellar soft tissue swelling. There is a vascular stent noted posteriorly. There is chronic appearing cortical thickening of the tibial diaphysis, partially visualized. IMPRESSION: No evidence of acute fracture. Mild prepatellar soft tissue swelling. Mild tricompartment osteoarthritis.  Trace joint effusion. Electronically Signed   By: Maurine Simmering M.D.   On: 11/04/2021 08:42   DG Knee Left Port  Result Date: 11/04/2021 CLINICAL DATA:  Fall from bed, evaluate for fracture EXAM: PORTABLE LEFT KNEE - 1-2 VIEW COMPARISON:  None Available. FINDINGS: Below the knee amputation. Subcutaneous reticulation is generalized-usually chronic. Dystrophic and atheromatous calcifications. No acute fracture or dislocation. IMPRESSION: No acute finding. Electronically Signed   By: Jorje Guild M.D.   On: 11/04/2021 05:16            LOS: 53 days       Emeterio Reeve, DO [facility] 11/05/2021, 10:51 AM   Staff may message me via secure chat in [place]  but this may not receive immediate response,  please page for urgent matters!  If 7PM-7AM, please contact night-coverage [email or web address]  Dictation software was used to generate the above note. Typos may occur and escape review, as with typed/written notes. Please contact Dr Sheppard Coil directly for clarity if needed.

## 2021-11-05 NOTE — Progress Notes (Signed)
Physical Therapy Treatment Patient Details Name: [name]. MRN: [id number] DOB: 12/16/1955 Today's Date: 11/05/2021   History of Present Illness Jaylin Benzel is a 72yoM who comes to [facility] on 09/28/21 c progression of Left chornic heel ulcer. MRI showed significant progressive osteomyelitis in the left calcaneus. S/p L BKA 6/6. PT signed off 10/24/21 due to decline in respiratory status and transition to higher level of care due to increased demands for supplemental oxygen and high risk for deterioration and intubation.PMH HTN, IDDM2, morbid obesity, depression, CAD s/p CABG 2006 and PCI to RCA in 12/2020, chronic B venous stasis on [place] boots, chronic L foot ulcer, COPD, OSA on CPAP nightly, ai-fib, PAD, s/p multiple toe amputations.    PT Comments    Pt seen for PT tx with pt agreeable. Pt received on room air with pt unaware of need of supplemental O2 & SPO2 82% on room air. Placed nasal cannula on pt with pt requiring ~3 minutes on 5L/min to recover to >/= 90% with cuing for pursed lip breathing. PT deflated air mattress & pt required mod assist to transfer R sidelying<>sitting EOB with HOB elevated. Pt performed BLE LAQ for strengthening & ROM. Reviewed head/hips relationship with pt verbalizing understanding but still requiring multimodal cuing to maintain sufficient anterior lean to attempt scooting. Pt able to perform scooting to R along EOB to scoot higher towards HOB. Pt asking appropriate questions re: recovery & PT answering questions as best as possible. Pt tearful at times but PT providing encouragement. Pt is making good progress with functional mobility & verbalizes eagerness to participate.    Recommendations for follow up therapy are one component of a multi-disciplinary discharge planning process, led by the attending physician.  Recommendations may be updated based on patient status, additional functional criteria and insurance authorization.  Follow Up Recommendations   Skilled nursing-short term rehab (<3 hours/day) Can patient physically be transported by private vehicle: No   Assistance Recommended at Discharge Frequent or constant Supervision/Assistance  Patient can return home with the following Two people to help with bathing/dressing/bathroom;Two people to help with walking and/or transfers;Assistance with cooking/housework;Assist for transportation;Help with stairs or ramp for entrance;Direct supervision/assist for medications management;Direct supervision/assist for financial management   Equipment Recommendations   (TBD in next venue)    Recommendations for Other Services       Precautions / Restrictions Precautions Precautions: Fall Precaution Comments: contact ISO Restrictions Weight Bearing Restrictions: Yes RLE Weight Bearing: Weight bearing as tolerated LLE Weight Bearing: Non weight bearing Other Position/Activity Restrictions: Lt BKA 10/03/21     Mobility  Bed Mobility Overal bed mobility: Needs Assistance Bed Mobility: Sidelying to Sit, Sit to Sidelying   Sidelying to sit: Mod assist, HOB elevated     Sit to sidelying: Mod assist, HOB elevated General bed mobility comments: use of bed rails, HOB elevated, provided hand support & assist to upright trunk for sidelying>sitting, assistance to elevate BLE onto bed during sit>sidelying    Transfers                        Ambulation/Gait                   Stairs             Wheelchair Mobility    Modified Rankin (Stroke Patients Only)       Balance Overall balance assessment: Needs assistance Sitting-balance support: Bilateral upper extremity supported (RLE supported) Sitting balance-Leahy Scale: Poor  Sitting balance - Comments: CGA static sitting EOB                                    Cognition Arousal/Alertness: Awake/alert Behavior During Therapy: WFL for tasks assessed/performed Overall Cognitive Status: Within Functional  Limits for tasks assessed                                 General Comments: Pt with some confusion as pt received without supplemental O2 on reporting he doesn't need it but he actually does. Pt does ask appropriate questions re: recovery process, follows commands throughout session, and makes insightful statements throughout.        Exercises General Exercises - Lower Extremity Long Arc Quad: AROM, Strengthening, Both, 10 reps, Seated    General Comments        Pertinent Vitals/Pain Pain Assessment Pain Assessment: Faces Faces Pain Scale: No hurt    Home Living                          Prior Function            PT Goals (current goals can now be found in the care plan section) Acute Rehab PT Goals Patient Stated Goal: to get better PT Goal Formulation: With patient Time For Goal Achievement: 11/15/21 Potential to Achieve Goals: Fair Progress towards PT goals: Progressing toward goals    Frequency    Min 2X/week      PT Plan Current plan remains appropriate    Co-evaluation              AM-PAC PT "6 Clicks" Mobility   Outcome Measure  Help needed turning from your back to your side while in a flat bed without using bedrails?: A Lot Help needed moving from lying on your back to sitting on the side of a flat bed without using bedrails?: Total Help needed moving to and from a bed to a chair (including a wheelchair)?: Total Help needed standing up from a chair using your arms (e.g., wheelchair or bedside chair)?: Total Help needed to walk in hospital room?: Total Help needed climbing 3-5 steps with a railing? : Total 6 Click Score: 7    End of Session Equipment Utilized During Treatment: Oxygen Activity Tolerance: Patient tolerated treatment well Patient left: in bed;with call bell/phone within reach;with bed alarm set   PT Visit Diagnosis: Difficulty in walking, not elsewhere classified (R26.2);Other abnormalities of gait and  mobility (R26.89);Muscle weakness (generalized) (M62.81)     Time: 4650-3546 PT Time Calculation (min) (ACUTE ONLY): 24 min  Charges:  $Therapeutic Activity: 23-37 mins                     Lavone Nian, PT, DPT 11/05/21, 3:06 PM  [name] 11/05/2021, 3:03 PM

## 2021-11-05 NOTE — Progress Notes (Signed)
[facility]  SUBJECTIVE:  - Feels well this morning.  - Very much wants to leave the hospital and hopes they find a facility near his home.  - NO acute events. Denies shortness of breath or chest pain.   Vitals:   11/05/21 0000 11/05/21 0400 11/05/21 0600 11/05/21 0735  BP: (!) 87/54 97/62 106/76   Pulse: (!) 119 (!) 118 (!) 114 (!) 118  Resp: '16 13 15 18  '$ Temp:      TempSrc:      SpO2: 94% 97% (!) 88% 96%  Weight:      Height:         Intake/Output Summary (Last 24 hours) at 11/05/2021 0851 Last data filed at 11/05/2021 0700 Gross per 24 hour  Intake 1800 ml  Output 1400 ml  Net 400 ml     PHYSICAL EXAM  General: Awake and alert HEENT:  Normocephalic and atramatic Neck:  No JVD.  Lungs: Clear bilaterally to auscultation without appreciable crackles or wheezes Heart: Tachycardic but regular . Normal S1 and S2 without gallops or murmurs.  Abdomen: morbidly obese appearing Msk:  [place] induration of RLE, wrapped in kerlix. S/p L BKA Extremities: moves all extremities spontaneously, Left BKA Neuro: No focal deficits.  Psych:  Seems appropriate    LABS: Basic Metabolic Panel: Recent Labs    11/04/21 0455 11/05/21 0405  NA 138 138  K 4.4 4.7  CL 100 99  CO2 33* 33*  GLUCOSE 92 168*  BUN 33* 32*  CREATININE 0.95 0.75  CALCIUM 8.8* 8.3*  MG 1.7 1.9  PHOS 5.1* 4.1    Liver Function Tests: No results for input(s): "AST", "ALT", "ALKPHOS", "BILITOT", "PROT", "ALBUMIN" in the last 72 hours.  No results for input(s): "LIPASE", "AMYLASE" in the last 72 hours. CBC: Recent Labs    11/03/21 0351 11/05/21 0405  WBC 8.9 7.2  HGB 9.5* 9.5*  HCT 33.0* 32.0*  MCV 95.4 93.6  PLT 352 332    Cardiac Enzymes: No results for input(s): "CKTOTAL", "CKMB", "CKMBINDEX", "TROPONINI" in the last 72 hours. BNP: Invalid input(s): "POCBNP" D-Dimer: No results for input(s): "DDIMER" in the last 72 hours. Hemoglobin A1C: No results for input(s): "HGBA1C" in the last 72  hours. Fasting Lipid Panel: No results for input(s): "CHOL", "HDL", "LDLCALC", "TRIG", "CHOLHDL", "LDLDIRECT" in the last 72 hours. Thyroid Function Tests: No results for input(s): "TSH", "T4TOTAL", "T3FREE", "THYROIDAB" in the last 72 hours.  Invalid input(s): "FREET3"  Anemia Panel: No results for input(s): "VITAMINB12", "FOLATE", "FERRITIN", "TIBC", "IRON", "RETICCTPCT" in the last 72 hours.   DG Knee Right Port  Result Date: 11/04/2021 CLINICAL DATA:  [place] out of bed, evaluate for fracture EXAM: PORTABLE RIGHT KNEE - 1-2 VIEW COMPARISON:  None Available. FINDINGS: There is no evidence of acute fracture. Alignment is normal. There is mild tricompartment osteoarthritis. There is a trace joint effusion. There is mild prepatellar soft tissue swelling. There is a vascular stent noted posteriorly. There is chronic appearing cortical thickening of the tibial diaphysis, partially visualized. IMPRESSION: No evidence of acute fracture. Mild prepatellar soft tissue swelling. Mild tricompartment osteoarthritis.  Trace joint effusion. Electronically Signed   By: Maurine Simmering M.D.   On: 11/04/2021 08:42   DG Knee Left Port  Result Date: 11/04/2021 CLINICAL DATA:  Fall from bed, evaluate for fracture EXAM: PORTABLE LEFT KNEE - 1-2 VIEW COMPARISON:  None Available. FINDINGS: Below the knee amputation. Subcutaneous reticulation is generalized-usually chronic. Dystrophic and atheromatous calcifications. No acute fracture or dislocation. IMPRESSION:  No acute finding. Electronically Signed   By: Jorje Guild M.D.   On: 11/04/2021 05:16     Echo LVEF 55-60% 10/05/2021  TELEMETRY: Atrial flutter 116 bpm:  ASSESSMENT AND PLAN:  Principal Problem:   Acute delirium Active Problems:   Essential hypertension, benign   RLS (restless legs syndrome)   Osteomyelitis (HCC)   Obesity, Class III, BMI 40-49.9 (morbid obesity) (HCC)   Acute on chronic diastolic CHF (congestive heart failure) (HCC)   AF (paroxysmal  atrial fibrillation) ([place])   Uncontrolled type 2 diabetes mellitus with hypoglycemia, with long-term current use of insulin (HCC)   Malnutrition of moderate degree   Generalized weakness   Acute respiratory failure with hypoxia and hypercapnia (HCC)   OSA (obstructive sleep apnea)   Sepsis due to methicillin resistant Staphylococcus aureus (MRSA) (HCC)   Acute on chronic respiratory failure with hypoxia and hypercapnia (HCC)   Acute urinary retention   Hyperkalemia   Iron deficiency anemia   UTI (urinary tract infection)   Pressure injury of skin of heel    1. Acute on chronic diastolic congestive heart failure, HFpEF, LVEF 55-60% 10/05/21, good diuresis after IV furosemide but remains on supplemental O2 2.  Persistent atrial flutter, currently in atrial flutter heart rate 116 bpm, on amiodarone for rate control and Eliquis for stroke prevention (home rate control diltiazem '180mg'$  and metoprolol tartrate '100mg'$  BID) 3.  Coronary artery disease, status post CABG 2006, PCI RCA 01/2020, currently without chest pain 4.  Left foot osteomyelitis s/p IV linezolid, left BKA 10/03/2021 by Dr. Delana Meyer 5.  Acute kidney injury, improving to Cr 0.77 and >60  6.  MRSA bacteremia, osteomyelitis of L foot likely source, treated with IV daptomycin, repeat echo ordered by ID, resulted with thickened aortic and mitral leaflets without obvious vegetation.      Recommendations   1.  Agree with current therapy 2.  Continue Eliquis for stroke prevention 3.  Continue amiodarone 200 mg daily for rate control.  - Continue diltiazem 90 q12; may need to increase based on BP and HR (home dose much higher) - Holding metoprolol- may need to resume 4.  Diuresis as needed.s/p torsemide 60 mg PO 7/7 with >> 4 L net negative. Hold today 5.  Wean oxygen as tolerated 6.  Pending rehab vs LTAC placement    Andrez Grime, MD 11/05/2021 8:51 AM

## 2021-11-06 DIAGNOSIS — R410 Disorientation, unspecified: Secondary | ICD-10-CM | POA: Diagnosis not present

## 2021-11-06 LAB — BASIC METABOLIC PANEL
Anion gap: 6 (ref 5–15)
BUN: 25 mg/dL — ABNORMAL HIGH (ref 8–23)
CO2: 31 mmol/L (ref 22–32)
Calcium: 8.4 mg/dL — ABNORMAL LOW (ref 8.9–10.3)
Chloride: 101 mmol/L (ref 98–111)
Creatinine, Ser: 0.68 mg/dL (ref 0.61–1.24)
GFR, Estimated: >60 mL/min (ref >60)
Glucose, Bld: 192 mg/dL — ABNORMAL HIGH (ref 70–99)
Potassium: 4.6 mmol/L (ref 3.5–5.1)
Sodium: 138 mmol/L (ref 135–145)

## 2021-11-06 LAB — BLOOD GAS, VENOUS
Acid-Base Excess: 8.3 mmol/L — ABNORMAL HIGH (ref 0.0–2.0)
Bicarbonate: 33.9 mmol/L — ABNORMAL HIGH (ref 20.0–28.0)
O2 Saturation: 63.8 %
Patient temperature: 37
pCO2, Ven: 51 mmHg (ref 44–60)
pH, Ven: 7.43 (ref 7.25–7.43)
pO2, Ven: 37 mmHg (ref 32–45)

## 2021-11-06 LAB — URINALYSIS, COMPLETE (UACMP) WITH MICROSCOPIC
Bilirubin Urine: NEGATIVE
Glucose, UA: NEGATIVE mg/dL
Hgb urine dipstick: NEGATIVE
Ketones, ur: NEGATIVE mg/dL
Nitrite: NEGATIVE
Protein, ur: 100 mg/dL — AB
Specific Gravity, Urine: 1.008 (ref 1.005–1.030)
pH: 7 (ref 5.0–8.0)

## 2021-11-06 LAB — GLUCOSE, CAPILLARY
Glucose-Capillary: 172 mg/dL — ABNORMAL HIGH (ref 70–99)
Glucose-Capillary: 328 mg/dL — ABNORMAL HIGH (ref 70–99)
Glucose-Capillary: 184 mg/dL — ABNORMAL HIGH (ref 70–99)
Glucose-Capillary: 174 mg/dL — ABNORMAL HIGH (ref 70–99)

## 2021-11-06 LAB — PHOSPHORUS: Phosphorus: 3.4 mg/dL (ref 2.5–4.6)

## 2021-11-06 LAB — MAGNESIUM: Magnesium: 2 mg/dL (ref 1.7–2.4)

## 2021-11-06 NOTE — Progress Notes (Signed)
Patient found kneeling on floor with upper body draped on the bed. Denies pain or hitting head due to fall. Patient reports he unlatched his bedrail and he slid out of bed. Bed alarm not working effectively. Chair alarm placed on patient in bed after assisting him back to bed and bed exchange requested. Provider and Wife notified.  11/06/21 0846  What Happened  Patient found on floor;other (Comment) (Bed/Floor)  Found by Staff-comment  Stated prior activity other (comment) (In bed)  Follow Up  MD notified Dr Sheppard Coil  Time MD notified [phone]  Family notified Yes - comment  Time family notified 0900  Additional tests No  Progress note created (see row info) Yes  Adult Fall Risk Assessment  Risk Factor Category (scoring not indicated) Fall has occurred during this admission (document High fall risk);High fall risk per protocol (document High fall risk)  Age 66  Fall History: Fall within 6 months prior to admission 0  Elimination; Bowel and/or Urine Incontinence 2  Elimination; Bowel and/or Urine Urgency/Frequency 0  Medications: includes PCA/Opiates, Anti-convulsants, Anti-hypertensives, Diuretics, Hypnotics, Laxatives, Sedatives, and Psychotropics 5  Patient Care Equipment 1  Mobility-Assistance 2  Mobility-Gait 2  Mobility-Sensory Deficit 0  Altered awareness of immediate physical environment 1  Impulsiveness 2  Lack of understanding of one's physical/cognitive limitations 4  Total Score 20  Patient Fall Risk Level High fall risk  Adult Fall Risk Interventions  Required Bundle Interventions *See Row Information* High fall risk - low, moderate, and high requirements implemented  Additional Interventions Use of appropriate toileting equipment (bedpan, BSC, etc.)  Screening for Fall Injury Risk (To be completed on HIGH fall risk patients) - Assessing Need for Floor Mats  Risk For Fall Injury- Criteria for Floor Mats None identified - No additional interventions needed  Will Implement  Floor Mats Yes  Vitals  Temp 98.2 F (36.8 C)  Temp Source Oral  BP 127/82  MAP (mmHg) 96  BP Location Left Arm  BP Method Automatic  Patient Position (if appropriate) Lying  Pulse Rate (!) 118  Pulse Rate Source Monitor  Resp 16  Oxygen Therapy  SpO2 95 %  O2 Device Nasal Cannula  O2 Flow Rate (L/min) 4 L/min  Pain Assessment  Pain Scale 0-10  Pain Score 0  PCA/Epidural/Spinal Assessment  Respiratory Pattern Regular;Unlabored  Neurological  Neuro (WDL) X  Level of Consciousness Alert  Orientation Level Oriented X4  Cognition Follows commands;Impulsive;Poor attention/concentration;Poor judgement;Poor safety awareness  Speech Clear  R Pupil Size (mm) 3  R Pupil Shape Round  R Pupil Reaction Brisk  L Pupil Size (mm) 3  L Pupil Shape Round  L Pupil Reaction Brisk  Motor Function/Sensation Assessment Grip  R Hand Grip Strong  L Hand Grip Strong  R Foot Dorsiflexion Weak  L Foot Dorsiflexion Other (Comment) (BKA)  R Foot Plantar Flexion Weak  L Foot Plantar Flexion Other (Comment) (BKA)  RUE Motor Response Purposeful movement  RUE Sensation Full sensation  RUE Motor Strength 5  LUE Motor Response Purposeful movement  LUE Sensation Full sensation  LUE Motor Strength 5  RLE Motor Response Purposeful movement  RLE Sensation Full sensation  RLE Motor Strength 5  LLE Motor Response Purposeful movement  LLE Sensation Full sensation  LLE Motor Strength Other (Comment)  Neuro Symptoms Forgetful;Other (Comment) (BKA)  Neuro symptoms relieved by Rest  Glasgow Coma Scale  Eye Opening 4  Best Verbal Response (NON-intubated) 5  Best Motor Response 6  Glasgow Coma Scale Score 15  Musculoskeletal  Assistive Device None  Generalized Weakness Yes  Weight Bearing Restrictions Yes  RLE Weight Bearing WBAT  LLE Weight Bearing NWB  Musculoskeletal Details  RLE Full movement  LLE BKA;Limited movement  Integumentary  Skin Color Appropriate for ethnicity  Skin Condition  Dry  Skin Integrity Ecchymosis  Ecchymosis Location Leg  Ecchymosis Location Orientation Bilateral

## 2021-11-06 NOTE — Progress Notes (Signed)
PROGRESS NOTE    [name].  VZD:[id number] DOB: 03-07-1956  DOA: 09/28/2021 Date of Service: 11/06/21 PCP: [name], MD     Hospital Course:  Alec Less. is a 66 y.o. male with medical history significant for CHF with last known LVEF of 50 to 55%, HTN, CAD, MDD, COPD, A-fib, morbid obesity (BMI 21), OSA, status post recent wound debridement and partial calcanectomy (05/16) on oral antibiotic therapy after completion of IV. He presented to the ER 09/28/2021 for bilateral lower extremity swelling, increased redness and swelling involving the left lower extremity mostly over the heel. Admitted 09/28/2021  06/1: Pt admitted to the [facility] unit with acute on chronic diastolic CHF and left heel osteomyelitis  06/2: Podiatry consulted for left foot osteomyelitis, plan for higher amputation.  Vascular surgery consulted  06/4: ID consulted for MRSA bacteremia likely source left heel wound  06/6: Pt underwent left BKA per vascular surgery  06/6 to 06/17: Pt with worsening acute on chronic hypoxic respiratory failure secondary to volume overload due to diastolic CHF.  CXR revealed new pleural effusion.  IV lasix resumed.  Requiring Bipap or [place]  06/21 to 06/27: mentation waxed and waned requiring Bipap during the day due to hypoxia  06/27: Pt requiring 100% HHFNC and transfer to the stepdown unit.  PCCM team consulted to assist with management  6/28: Pt tolerating [place] @ 50L/85%; Underwent Right-sided thoracentesis with removal of 1.2L of pleural fluid 6/29: Tolerating 45L/70% FiO2 with O2 sats 100%; remains on levophed gtt '@4'$  mcg/min to maintain map >65 6/30: altered mental status, but not from hypercapnia  7/1: Pt with intermittent agitation with worsening delirium that started 6/20.  Received prn seroquel and haldol overnight due to sleep deprivation.  This am pt calm with improvement in mentation  7/2: Pt lethargic after receiving prn haldol x1 dose and seroquel x1  dose.  Tolerating Bipap '@30'$ % FiO2.  If pt remains stable throughout the day will transfer service to [facility] starting 07/3 and PCCM team will sign off  7/3-7/4: supplemental oxygen and currently on bubbler at 10L. Can be transferred to PCU. Mental status has much improved today. PT/OT will likely need to come re-evaluate but will likely need LTAC vs SNF. CM is working on this. 7/5: still requiring 12L O2 this AM but --> 5L later in the day. 7/6: down to 4L [place] in the day, still on BiPap qhs/when asleep. PT/OT recommending SNF/LTAC, TOC working on LTAC but given O2 requirement have improved he may be able to go to SNF, TOC will look into this. Increased beta blocker given tachycardia. Cardiology also following.   7/7: potassium has been slightly elevated, worsening today. Restarted torsemide. D/w pharmacy re: risk QT prolongation w/ BB, amiodarone, antipsychotics - appreciate cardiology input: ok to d/c midodrine, restart diltiazem and d/c metoprolol, restart loop diuretic 7/8-7/9: . K is improved/down to WNL. Cr WNL, BUN trended up. Concern he is volume contracted, cardio d/c torsemide 7/8. Cardio recs: amiodarone 200 gm daily, diltiazem 90 q12 may need to increase, metoprolol held (was increased d/t HR, concern for high K and since holding this K has improved), diuresis as needed w/ torsemide which is held for now. HR remains elevated but otherwise he is approaching medical stability and TOC is working on placement.  7/10: Brief episode of confusion/hallucination which resolved spontaneously (patient states he was seeing finishes either outside the window or at his bedside table), ABG normal, UA obtained, remove Foley today hopefully can  leave this out if urinary retention has resolved.    Consultants:  Podiatry Vascular Surgery Infectious Disease Cardiology Palliative Care   Procedures: 10/03/21: left BKA per vascular surgery    Subjective: PT reports feeling okay today, he repeats as he has the  past few days that really wants to be out of the hospital. No CP/SOB.      ASSESSMENT & PLAN:   Principal Problem:   Acute delirium Active Problems:   Sepsis due to methicillin resistant Staphylococcus aureus (MRSA) ([place])   Acute on chronic respiratory failure with hypoxia and hypercapnia (HCC)   UTI (urinary tract infection)   Acute on chronic diastolic CHF (congestive heart failure) ([place])   Uncontrolled type 2 diabetes mellitus with hypoglycemia, with long-term current use of insulin (HCC)   AF (paroxysmal atrial fibrillation) (HCC)   Obesity, Class III, BMI 40-49.9 (morbid obesity) ([place])   Essential hypertension, benign   Osteomyelitis (HCC)   RLS (restless legs syndrome)   Acute urinary retention   Malnutrition of moderate degree   Generalized weakness   Acute respiratory failure with hypoxia and hypercapnia (HCC)   OSA (obstructive sleep apnea)   Hyperkalemia   Iron deficiency anemia   Pressure injury of skin of heel  Sepsis POA, resolved: due to methicillin resistant Staphylococcus aureus. RESOLVED Blood cxs (+)MRSA. Repeat blood cx NGTD, Echo no vegetations on TTE, unable to to TEE. Likely secondary to osteomyelitis. Continue on linezolid (was on daptomycin but concern for eosinophilia so plan for linezolid until 11/01/21. Poor prognosis. High risk for intubation & cardiac arrest. See infectious disease notes 07/03: ID signed off    Acute hypoxic/hypercapnic  resp failure- combination of CHF, hypoventilation syndrome,OSA , CO2 retention. IMPROVED/STABLE  Ruled out amiodarone induced pneumonitis and dapto induced eosinophilic pneumonia.  BiPAP qhs / while asleep    Acute on chronic diastolic CHF: EF 55 to 28% 04/2021 - RESOLVED/STABLE Cardiology following. Has been relatively well-compensated past few days.  Cardio recs: amiodarone 200 gm daily, diltiazem 90 q12 may need to increase, metoprolol held (was increased d/t HR, concern for high K and since holding this K has  improved), diuresis as needed w/ torsemide which is held for now.   Tachycardia and occasional elevated RR = SIRS, sepsis r/o HR remains elevated but otherwise he is approaching medical stability and TOC is working on placement.   Hyperkalemia - IMPROVED/RESOLVED potassium has been slightly elevated, worsening 7/7.  Stopped beta-blocker due to potential this may be contributing to hyperkalemia, started diltiazem, added back torsemide.  Trend BMP in a.m. --> improved, torsemide d/c 7/8  PAF:  HTN:  continue on eliquis, amio, diltiazem.  Restarted metoprolol given tachycardia and BP has been better however discontinued this due to concerns for mild but worsening hyperkalemia.  Placed on diltiazem. Cardio following.  Ideally would increase diltiazem but BP has been low Previously was on pressors but since have been weaned off  UTI - RESOLVED:  urine cx (+)citrobacter. Completed abx course 3 days cefepime.     Acute delirium -RESOLVED:  labile mental status. Continue on risperidone. Seroquel qhs prn     DM2:  poorly controlled, HbA1c 10.2. Continue on glargine, aspart, SSI w/ accuchecks   Morbid obesity:  BMI 41.4. Complicates overall care & prognosis    Osteomyelitis of LLE: s/p L BLKA.  Completed linezolid 11/01/21 as per ID    RLS: holding home dose of mirapex   Acute urinary retention: foley catheter placed on 10/08/2021.  Foley DC'd on 6/16.  Foley needed to be replaced on 10/24/2021   IDA: H&H had been labile. No need for a tranfusion    OSA: CPAP qhs. Qualifies for NIV with elevated PCO2 on ABG.    Generalized weakness: PT/OT will have to re-evaluate pt   Malnutrition of moderate degree: related to acute illness (left heel osteomyelitis) and energy intake of less than 75% for more than 7 days, mild fat depletion.  Continue on nutritional supplements           DVT prophylaxis: Eliquis Code Status: FULL Family Communication: none at this time, will call if urgent updates  (wife has expressed she is stressed w/ a lot of call from here and from pt so would like to be kept in the loop for major events / updates)  Disposition Plan / TOC needs: likely will need d/c to SNF/LTAC, evaluating for inpatient rehab as well may be a good candidate for this Barriers to discharge / significant pending items: requiring high supplemental O2, may need LTAC vs SNF              Objective: Vitals:   11/06/21 0711 11/06/21 0846 11/06/21 1152 11/06/21 1521  BP: 106/72 127/82 118/68 118/84  Pulse: (!) 118 (!) 118 (!) 121 (!) 122  Resp: '16 16 18 18  '$ Temp: 98.2 F (36.8 C) 98.2 F (36.8 C) 98 F (36.7 C) 98.1 F (36.7 C)  TempSrc:  Oral    SpO2: 96% 95% 97% 96%  Weight:      Height:        Intake/Output Summary (Last 24 hours) at 11/06/2021 1536 Last data filed at 11/06/2021 1327 Gross per 24 hour  Intake 480 ml  Output 2125 ml  Net -1645 ml    Filed Weights   10/27/21 0500 10/28/21 0500 11/03/21 0350  Weight: (!) 155.7 kg (!) 154.4 kg (!) 167.8 kg    Examination:  Constitutional:  VS as above General Appearance: alert, well-developed, well-nourished, NAD Eyes: Normal lids and conjunctive, non-icteric sclera Neck: No masses, trachea midline Respiratory: Normal respiratory effort Breath sounds normal, no wheeze/rhonchi/rales - limited exam pt declined to roll over or sit up  Cardiovascular: S1/S2 normal Trace lower extremity edema RLE Gastrointestinal: Nontender, no masses but habitus limits exam.  Musculoskeletal:  No clubbing/cyanosis of digits Left BKA Neurological: No cranial nerve deficit on limited exam Psychiatric: Normal judgment/insight Depressed/flat mood and affect       Scheduled Medications:   amiodarone  200 mg Oral Daily   apixaban  5 mg Oral BID   vitamin C  500 mg Oral Daily   aspirin EC  81 mg Oral Daily   budesonide (PULMICORT) nebulizer solution  0.5 mg Nebulization BID   chlorhexidine  15 mL Mouth Rinse BID    Chlorhexidine Gluconate Cloth  6 each Topical Daily   cholecalciferol  1,000 Units Oral Daily   diltiazem  90 mg Oral Q12H   ezetimibe  10 mg Oral Daily   ferrous sulfate  325 mg Oral Q breakfast   finasteride  5 mg Oral Daily   insulin aspart  0-20 Units Subcutaneous TID WC   insulin aspart  10 Units Subcutaneous TID WC   insulin glargine-yfgn  25 Units Subcutaneous QHS   insulin glargine-yfgn  5 Units Subcutaneous Daily   ipratropium-albuterol  3 mL Nebulization BID   levothyroxine  25 mcg Oral Q0600   multivitamin with minerals  1 tablet Oral Daily   nystatin   Topical BID   primidone  250 mg Oral BID   Ensure Max Protein  11 oz Oral BID   risperiDONE  0.5 mg Oral BID   sodium chloride flush  10-40 mL Intracatheter Q12H   vitamin B-12  1,000 mcg Oral Daily    Continuous Infusions:  sodium chloride Stopped (10/25/21 2209)    PRN Medications:  acetaminophen **OR** acetaminophen, albuterol, haloperidol lactate, metoprolol tartrate, nitroGLYCERIN, ondansetron **OR** ondansetron (ZOFRAN) IV, mouth rinse, QUEtiapine, sodium chloride flush  Antimicrobials:  Anti-infectives (From admission, onward)    Start     Dose/Rate Route Frequency Ordered Stop   10/26/21 2200  linezolid (ZYVOX) tablet 600 mg        600 mg Oral Every 12 hours 10/26/21 1743 11/01/21 2139   10/24/21 2200  linezolid (ZYVOX) IVPB 600 mg  Status:  Discontinued        600 mg 300 mL/hr over 60 Minutes Intravenous Every 12 hours 10/24/21 2157 10/26/21 1743   10/24/21 1800  ceFEPIme (MAXIPIME) 2 g in sodium chloride 0.9 % 100 mL IVPB        2 g 200 mL/hr over 30 Minutes Intravenous Every 8 hours 10/24/21 1612 10/26/21 1730   10/24/21 1030  sulfamethoxazole-trimethoprim (BACTRIM DS) 800-160 MG per tablet 1 tablet  Status:  Discontinued        1 tablet Oral Every 12 hours 10/24/21 0944 10/24/21 1608   10/23/21 2000  DAPTOmycin (CUBICIN) 800 mg in sodium chloride 0.9 % IVPB  Status:  Discontinued        6 mg/kg  129.2  kg (Adjusted) 132 mL/hr over 30 Minutes Intravenous Every 24 hours 10/23/21 0745 10/24/21 1803   10/22/21 0945  cefTRIAXone (ROCEPHIN) 1 g in sodium chloride 0.9 % 100 mL IVPB        1 g 200 mL/hr over 30 Minutes Intravenous Every 24 hours 10/22/21 0848 10/24/21 1845   10/04/21 1000  DAPTOmycin (CUBICIN) 800 mg in sodium chloride 0.9 % IVPB  Status:  Discontinued        6 mg/kg  129.2 kg (Adjusted) 132 mL/hr over 30 Minutes Intravenous Every 24 hours 10/03/21 1648 10/23/21 0745   10/03/21 0000  vancomycin (VANCOREADY) IVPB 1500 mg/300 mL        1,500 mg 150 mL/hr over 120 Minutes Intravenous Every 12 hours 10/02/21 1348 10/03/21 2359   09/30/21 1330  cefTRIAXone (ROCEPHIN) 2 g in sodium chloride 0.9 % 100 mL IVPB  Status:  Discontinued        2 g 200 mL/hr over 30 Minutes Intravenous Daily 09/30/21 1233 10/03/21 1849   09/30/21 1200  vancomycin (VANCOREADY) IVPB 1500 mg/300 mL  Status:  Discontinued        1,500 mg 150 mL/hr over 120 Minutes Intravenous Every 12 hours 09/30/21 0120 09/30/21 0927   09/30/21 1200  vancomycin (VANCOREADY) IVPB 1250 mg/250 mL  Status:  Discontinued        1,250 mg 166.7 mL/hr over 90 Minutes Intravenous Every 12 hours 09/30/21 0927 10/02/21 1348   09/30/21 0000  vancomycin (VANCOREADY) IVPB 1250 mg/250 mL       See Hyperspace for full Linked Orders Report.   1,250 mg 166.7 mL/hr over 90 Minutes Intravenous  Once 09/29/21 2308 09/30/21 0538   09/30/21 0000  vancomycin (VANCOREADY) IVPB 1250 mg/250 mL       See Hyperspace for full Linked Orders Report.   1,250 mg 166.7 mL/hr over 90 Minutes Intravenous  Once 09/29/21 2308 09/30/21 0255   09/29/21 0200  cefTRIAXone (ROCEPHIN)  2 g in sodium chloride 0.9 % 100 mL IVPB  Status:  Discontinued       See Hyperspace for full Linked Orders Report.   2 g 200 mL/hr over 30 Minutes Intravenous Every 24 hours 09/28/21 2051 09/29/21 2309   09/29/21 0200  metroNIDAZOLE (FLAGYL) IVPB 500 mg  Status:  Discontinued        See Hyperspace for full Linked Orders Report.   500 mg 100 mL/hr over 60 Minutes Intravenous Every 12 hours 09/28/21 2051 10/03/21 1849   09/28/21 1830  vancomycin (VANCOCIN) IVPB 1000 mg/200 mL premix        1,000 mg 200 mL/hr over 60 Minutes Intravenous  Once 09/28/21 1826 09/28/21 1937   09/28/21 1745  piperacillin-tazobactam (ZOSYN) IVPB 3.375 g        3.375 g 100 mL/hr over 30 Minutes Intravenous  Once 09/28/21 1736 09/28/21 1838       Data Reviewed: I have personally reviewed following labs and imaging studies  CBC: Recent Labs  Lab 11/01/21 0452 11/02/21 0400 11/03/21 0351 11/05/21 0405  WBC 9.1 8.9 8.9 7.2  HGB 9.3* 9.4* 9.5* 9.5*  HCT 32.6* 33.3* 33.0* 32.0*  MCV 96.2 96.0 95.4 93.6  PLT 305 333 352 867    Basic Metabolic Panel: Recent Labs  Lab 11/02/21 0400 11/03/21 0351 11/04/21 0455 11/05/21 0405 11/06/21 0415  NA 136 138 138 138 138  K 5.2* 5.4* 4.4 4.7 4.6  CL 101 103 100 99 101  CO2 30 31 33* 33* 31  GLUCOSE 156* 142* 92 168* 192*  BUN 23 29* 33* 32* 25*  CREATININE 0.71 0.72 0.95 0.75 0.68  CALCIUM 8.4* 8.4* 8.8* 8.3* 8.4*  MG 1.8 1.9 1.7 1.9 2.0  PHOS 3.7 4.3 5.1* 4.1 3.4    GFR: Estimated Creatinine Clearance: 153.1 mL/min (by C-G formula based on SCr of 0.68 mg/dL). Liver Function Tests: Recent Labs  Lab 10/31/21 0523 11/01/21 0452  AST 21 14*  ALT 37 31  ALKPHOS 297* 289*  BILITOT 0.6 0.5  PROT 6.2* 6.8  ALBUMIN 2.3* 2.4*    No results for input(s): "LIPASE", "AMYLASE" in the last 168 hours. No results for input(s): "AMMONIA" in the last 168 hours. Coagulation Profile: No results for input(s): "INR", "PROTIME" in the last 168 hours. Cardiac Enzymes: No results for input(s): "CKTOTAL", "CKMB", "CKMBINDEX", "TROPONINI" in the last 168 hours. BNP (last 3 results) No results for input(s): "PROBNP" in the last 8760 hours. HbA1C: No results for input(s): "HGBA1C" in the last 72 hours. CBG: Recent Labs  Lab 11/05/21 1727  11/05/21 2147 11/06/21 0713 11/06/21 1154 11/06/21 1524  GLUCAP 169* 325* 172* 328* 184*    Lipid Profile: No results for input(s): "CHOL", "HDL", "LDLCALC", "TRIG", "CHOLHDL", "LDLDIRECT" in the last 72 hours. Thyroid Function Tests: No results for input(s): "TSH", "T4TOTAL", "FREET4", "T3FREE", "THYROIDAB" in the last 72 hours. Anemia Panel: No results for input(s): "VITAMINB12", "FOLATE", "FERRITIN", "TIBC", "IRON", "RETICCTPCT" in the last 72 hours. Urine analysis:    Component Value Date/Time   COLORURINE YELLOW (A) 10/21/2021 1318   APPEARANCEUR CLOUDY (A) 10/21/2021 1318   LABSPEC 1.008 10/21/2021 1318   PHURINE 6.0 10/21/2021 1318   GLUCOSEU NEGATIVE 10/21/2021 1318   HGBUR LARGE (A) 10/21/2021 1318   BILIRUBINUR NEGATIVE 10/21/2021 1318   KETONESUR NEGATIVE 10/21/2021 1318   PROTEINUR 100 (A) 10/21/2021 1318   NITRITE NEGATIVE 10/21/2021 1318   LEUKOCYTESUR LARGE (A) 10/21/2021 1318   Sepsis Labs: '@LABRCNTIP'$ (procalcitonin:4,lacticidven:4)  No results found for  this or any previous visit (from the past 240 hour(s)).        Radiology Studies last 96 hours: DG Knee Right Port  Result Date: 11/04/2021 CLINICAL DATA:  [place] out of bed, evaluate for fracture EXAM: PORTABLE RIGHT KNEE - 1-2 VIEW COMPARISON:  None Available. FINDINGS: There is no evidence of acute fracture. Alignment is normal. There is mild tricompartment osteoarthritis. There is a trace joint effusion. There is mild prepatellar soft tissue swelling. There is a vascular stent noted posteriorly. There is chronic appearing cortical thickening of the tibial diaphysis, partially visualized. IMPRESSION: No evidence of acute fracture. Mild prepatellar soft tissue swelling. Mild tricompartment osteoarthritis.  Trace joint effusion. Electronically Signed   By: Maurine Simmering M.D.   On: 11/04/2021 08:42   DG Knee Left Port  Result Date: 11/04/2021 CLINICAL DATA:  Fall from bed, evaluate for fracture EXAM: PORTABLE  LEFT KNEE - 1-2 VIEW COMPARISON:  None Available. FINDINGS: Below the knee amputation. Subcutaneous reticulation is generalized-usually chronic. Dystrophic and atheromatous calcifications. No acute fracture or dislocation. IMPRESSION: No acute finding. Electronically Signed   By: Jorje Guild M.D.   On: 11/04/2021 05:16            LOS: 52 days       Emeterio Reeve, DO [facility] 11/06/2021, 3:36 PM   Staff may message me via secure chat in [place]  but this may not receive immediate response,  please page for urgent matters!  If 7PM-7AM, please contact night-coverage [email or web address]  Dictation software was used to generate the above note. Typos may occur and escape review, as with typed/written notes. Please contact Dr Sheppard Coil directly for clarity if needed.

## 2021-11-06 NOTE — Plan of Care (Signed)

## 2021-11-06 NOTE — Progress Notes (Signed)
Nurse Practioner notified that 240 has a foley without an order.  I was told to keep the foley due to acute urinary retention.

## 2021-11-06 NOTE — Progress Notes (Addendum)
Physical Therapy Treatment Patient Details Name: [name]. MRN: [id number] DOB: 11/08/1955 Today's Date: 11/06/2021   History of Present Illness [name] is a 59yoM who comes to [facility] on 09/28/21 c progression of Left chornic heel ulcer. MRI showed significant progressive osteomyelitis in the left calcaneus. S/p L BKA 6/6. PT signed off 10/24/21 due to decline in respiratory status and transition to higher level of care due to increased demands for supplemental oxygen and high risk for deterioration and intubation.PMH HTN, IDDM2, morbid obesity, depression, CAD s/p CABG 2006 and PCI to RCA in 12/2020, chronic B venous stasis on Unna boots, chronic L foot ulcer, COPD, OSA on CPAP nightly, ai-fib, PAD, s/p multiple toe amputations.    PT Comments    Pt seen for PT tx with pt agreeable & eager to participate. Pt does endorse falling out of bed this morning & nurse confirms this. Pt is oriented to time, situation, self & place & follows simple commands throughout session but does not recall working with this PT yesterday. Pt completes supine>sit with mod assist with heavy reliance on hospital bed features. Pt requires extra time & ultimately +2 assist for lateral scoot bed>drop arm recliner. Pt is able to recall head/hips relationship with moderate questioning cuing. Pt is able to shift anteriorly to lift buttocks but unable to scoot to R & also has difficulty maneuvering over bed rail despite it being folded down (will need to remove it next time, as well as continue to deflate the air mattress entirely). Pt is motivated to participate & asking appropriate questions re: recovery. Due to pt's increased activity tolerance & mobility, have upgraded d/c recommendations to CIR.  Also inquired about shrinker sock for LLE Personnel officer via secure chat, currently awaiting response).  Update, Dr. Lucky Cowboy okay with pt using shrinker sock.    Recommendations for follow up therapy are one  component of a multi-disciplinary discharge planning process, led by the attending physician.  Recommendations may be updated based on patient status, additional functional criteria and insurance authorization.  Follow Up Recommendations  Acute inpatient rehab (3hours/day) Can patient physically be transported by private vehicle: No   Assistance Recommended at Discharge Frequent or constant Supervision/Assistance  Patient can return home with the following Two people to help with bathing/dressing/bathroom;Two people to help with walking and/or transfers;Assistance with cooking/housework;Assist for transportation;Help with stairs or ramp for entrance;Direct supervision/assist for medications management;Direct supervision/assist for financial management   Equipment Recommendations  None recommended by PT    Recommendations for Other Services       Precautions / Restrictions Precautions Precautions: Fall Precaution Comments: contact ISO Restrictions Weight Bearing Restrictions: Yes RLE Weight Bearing: Weight bearing as tolerated LLE Weight Bearing: Non weight bearing Other Position/Activity Restrictions: Lt BKA 10/03/21     Mobility  Bed Mobility Overal bed mobility: Needs Assistance Bed Mobility: Sidelying to Sit   Sidelying to sit: Mod assist (bed rails, HOB elevated, PT's hand to stabilize to upright trunk, extra time to complete movement, cuing to push up with RUE)            Transfers Overall transfer level: Needs assistance Equipment used: None Transfers: Bed to chair/wheelchair/BSC            Lateral/Scoot Transfers: Max assist, +2 physical assistance      Ambulation/Gait                   Stairs  Wheelchair Mobility    Modified Rankin (Stroke Patients Only)       Balance Overall balance assessment: Needs assistance Sitting-balance support: Bilateral upper extremity supported (RLE support) Sitting balance-Leahy Scale: Fair                                       Cognition Arousal/Alertness: Awake/alert Behavior During Therapy: WFL for tasks assessed/performed Overall Cognitive Status: Within Functional Limits for tasks assessed Area of Impairment: Safety/judgement, Awareness, Problem solving                       Following Commands: Follows one step commands consistently, Follows one step commands with increased time Safety/Judgement: Decreased awareness of safety Awareness: Anticipatory   General Comments: Oriented to month, situation, follows commands.        Exercises General Exercises - Lower Extremity Long Arc Quad: AROM, Strengthening, 10 reps, Seated, Left    General Comments        Pertinent Vitals/Pain Pain Assessment Pain Assessment: Faces Faces Pain Scale: No hurt    Home Living                          Prior Function            PT Goals (current goals can now be found in the care plan section) Acute Rehab PT Goals Patient Stated Goal: to get better PT Goal Formulation: With patient Time For Goal Achievement: 11/15/21 Potential to Achieve Goals: Fair Progress towards PT goals: Progressing toward goals    Frequency    7X/week      PT Plan Discharge plan needs to be updated;Frequency needs to be updated    Co-evaluation              AM-PAC PT "6 Clicks" Mobility   Outcome Measure  Help needed turning from your back to your side while in a flat bed without using bedrails?: A Lot Help needed moving from lying on your back to sitting on the side of a flat bed without using bedrails?: Total Help needed moving to and from a bed to a chair (including a wheelchair)?: Total Help needed standing up from a chair using your arms (e.g., wheelchair or bedside chair)?: Total Help needed to walk in hospital room?: Total Help needed climbing 3-5 steps with a railing? : Total 6 Click Score: 7    End of Session Equipment Utilized During  Treatment: Gait belt;Oxygen Activity Tolerance: Patient tolerated treatment well Patient left: in chair;with chair alarm set;with call bell/phone within reach Nurse Communication: Mobility status PT Visit Diagnosis: Difficulty in walking, not elsewhere classified (R26.2);Other abnormalities of gait and mobility (R26.89);Muscle weakness (generalized) (M62.81)     Time: 9628-3662 PT Time Calculation (min) (ACUTE ONLY): 42 min  Charges:  $Therapeutic Activity: 38-52 mins                     Lavone Nian, PT, DPT 11/06/21, 11:51 AM   [name] 11/06/2021, 11:38 AM

## 2021-11-06 NOTE — Progress Notes (Signed)
Inpatient Rehab Admissions Coordinator:   Per therapy recommendations,  patient was screened for CIR candidacy by Clemens Catholic, MS, CCC-SLP. At this time, Pt. Appears to be a a potential candidate for CIR. I will place   order for rehab consult per protocol for full assessment; however, note that pt. Will likely require heavy assist at discharge and insurance will not pay for SNF after CIR, so candidacy will be partially dependent on having adequate caregiver support at discharge. Please contact me any with questions.  Clemens Catholic, [place], [place] Admissions Coordinator  [phone] ([place]) [phone] (office)

## 2021-11-06 NOTE — Plan of Care (Signed)
Spoke with attending Dr. Sheppard Coil. Patient is improving and working towards D/C. Per conversation PMT will sign off at this time. Please reconsult if needed.

## 2021-11-06 NOTE — Progress Notes (Signed)
[facility]  SUBJECTIVE:  -Feels well, very eager to leave the hospital -Seen eating breakfast this morning unassisted  Vitals:   11/06/21 0002 11/06/21 0403 11/06/21 0711 11/06/21 0846  BP: 109/63 (!) 126/91 106/72 127/82  Pulse: (!) 121 (!) 119 (!) 118 (!) 118  Resp: '20 18 16 16  '$ Temp: 98.3 F (36.8 C) 97.6 F (36.4 C) 98.2 F (36.8 C) 98.2 F (36.8 C)  TempSrc: Oral   Oral  SpO2: 99% 96% 96% 95%  Weight:      Height:         Intake/Output Summary (Last 24 hours) at 11/06/2021 1136 Last data filed at 11/06/2021 0600 Gross per 24 hour  Intake 720 ml  Output 1490 ml  Net -770 ml     PHYSICAL EXAM  General: Awake and alert, eating breakfast unassisted HEENT:  Normocephalic and atramatic Neck:  No JVD.  Lungs: Clear bilaterally to auscultation without appreciable crackles or wheezes Heart: Tachycardic but regular . Normal S1 and S2 without gallops or murmurs.  Abdomen: morbidly obese appearing Msk:  Woody induration of RLE, S/p L BKA well healed incision on stump Extremities: moves all extremities spontaneously, Left BKA Neuro: No focal deficits.  Psych:  Seems appropriate    LABS: Basic Metabolic Panel: Recent Labs    11/05/21 0405 11/06/21 0415  NA 138 138  K 4.7 4.6  CL 99 101  CO2 33* 31  GLUCOSE 168* 192*  BUN 32* 25*  CREATININE 0.75 0.68  CALCIUM 8.3* 8.4*  MG 1.9 2.0  PHOS 4.1 3.4    Liver Function Tests: No results for input(s): "AST", "ALT", "ALKPHOS", "BILITOT", "PROT", "ALBUMIN" in the last 72 hours.  No results for input(s): "LIPASE", "AMYLASE" in the last 72 hours. CBC: Recent Labs    11/05/21 0405  WBC 7.2  HGB 9.5*  HCT 32.0*  MCV 93.6  PLT 332    Cardiac Enzymes: No results for input(s): "CKTOTAL", "CKMB", "CKMBINDEX", "TROPONINI" in the last 72 hours. BNP: Invalid input(s): "POCBNP" D-Dimer: No results for input(s): "DDIMER" in the last 72 hours. Hemoglobin A1C: No results for input(s): "HGBA1C" in the last 72  hours. Fasting Lipid Panel: No results for input(s): "CHOL", "HDL", "LDLCALC", "TRIG", "CHOLHDL", "LDLDIRECT" in the last 72 hours. Thyroid Function Tests: No results for input(s): "TSH", "T4TOTAL", "T3FREE", "THYROIDAB" in the last 72 hours.  Invalid input(s): "FREET3"  Anemia Panel: No results for input(s): "VITAMINB12", "FOLATE", "FERRITIN", "TIBC", "IRON", "RETICCTPCT" in the last 72 hours.   No results found.   Echo LVEF 55-60% 10/05/2021  TELEMETRY: Atrial flutter 116 bpm:  ASSESSMENT AND PLAN:  Principal Problem:   Acute delirium Active Problems:   Essential hypertension, benign   RLS (restless legs syndrome)   Osteomyelitis (HCC)   Obesity, Class III, BMI 40-49.9 (morbid obesity) (HCC)   Acute on chronic diastolic CHF (congestive heart failure) (HCC)   AF (paroxysmal atrial fibrillation) ([place])   Uncontrolled type 2 diabetes mellitus with hypoglycemia, with long-term current use of insulin (HCC)   Malnutrition of moderate degree   Generalized weakness   Acute respiratory failure with hypoxia and hypercapnia (HCC)   OSA (obstructive sleep apnea)   Sepsis due to methicillin resistant Staphylococcus aureus (MRSA) (HCC)   Acute on chronic respiratory failure with hypoxia and hypercapnia (HCC)   Acute urinary retention   Hyperkalemia   Iron deficiency anemia   UTI (urinary tract infection)   Pressure injury of skin of heel    1. Acute on chronic diastolic congestive  heart failure, HFpEF, LVEF 55-60% 10/05/21, good diuresis after IV furosemide but remains on supplemental O2 2.  Persistent atrial flutter, currently in atrial flutter heart rate 116 bpm, on amiodarone for rate control and Eliquis for stroke prevention (home rate control diltiazem '180mg'$  and metoprolol tartrate '100mg'$  BID) 3.  Coronary artery disease, status post CABG 2006, PCI RCA 01/2020, currently without chest pain 4.  Left foot osteomyelitis s/p IV linezolid, left BKA 10/03/2021 by Dr. Delana Meyer 5.  Acute  kidney injury, improving to Cr 0.77 and >60  6.  MRSA bacteremia, osteomyelitis of L foot likely source, treated with IV daptomycin, repeat echo ordered by ID, resulted with thickened aortic and mitral leaflets without obvious vegetation.      Recommendations   1.  Agree with current therapy 2.  Continue Eliquis for stroke prevention 3.  Continue amiodarone 200 mg daily for rate control.  - Continue diltiazem 90 q12; may need to increase based on BP and HR (home dose much higher) - Holding metoprolol- may need to resume 4.  Diuresis as needed. s/p torsemide 60 mg PO 7/7 with >> 4 L net negative.  Continue to hold today 5.  Wean oxygen as tolerated 6.  Pending rehab vs LTAC placement   This patient's plan of care was discussed and created with Dr. Corky Sox and he is in agreement.    Tristan Schroeder, PA-C 11/06/2021 11:36 AM

## 2021-11-06 NOTE — Plan of Care (Signed)
?  Problem: Education: ?Goal: Ability to demonstrate management of disease process will improve ?Outcome: Progressing ?  ?

## 2021-11-06 NOTE — Progress Notes (Addendum)
Occupational Therapy Treatment Patient Details Name: [name]. MRN: [id number] DOB: [birth year] Today's Date: 11/06/2021   History of present illness [name] is a 9yoM who comes to [facility] on 09/28/21 c progression of Left chornic heel ulcer. MRI showed significant progressive osteomyelitis in the left calcaneus. S/p L BKA 6/6. PT signed off 10/24/21 due to decline in respiratory status and transition to higher level of care due to increased demands for supplemental oxygen and high risk for deterioration and intubation.PMH HTN, IDDM2, morbid obesity, depression, CAD s/p CABG 2006 and PCI to RCA in 12/2020, chronic B venous stasis on Unna boots, chronic L foot ulcer, COPD, OSA on CPAP nightly, ai-fib, PAD, s/p multiple toe amputations.   OT comments  Pt seen for OT tx this date. Pt reported his spouse coming by soon for a visit and bringing ice cream. He wished to remain up in the recliner for her visit then get back to bed with assistance later this afternoon/evening. RN notified. Pt set up for grooming tasks from recliner and did not require assist after set up. Pt educated in importance of O2, as he had taken his nasal cannula off prior to OT's arrival. SpO2 88% on room air, HR 123. [place] placed back on and pt verbalized understanding. Pt progressing and continues to benefit from skilled OT services to maximize return to PLOF. Recommending AIR at this time.     Recommendations for follow up therapy are one component of a multi-disciplinary discharge planning process, led by the attending physician.  Recommendations may be updated based on patient status, additional functional criteria and insurance authorization.    Follow Up Recommendations  Acute inpatient rehab (3hours/day)    Assistance Recommended at Discharge Frequent or constant Supervision/Assistance  Patient can return home with the following  A lot of help with bathing/dressing/bathroom;Assistance with cooking/housework;Help with  stairs or ramp for entrance;Assist for transportation;Two people to help with walking and/or transfers   Equipment Recommendations  Other (comment) (per next venue of care)    Recommendations for Other Services      Precautions / Restrictions Precautions Precautions: Fall Precaution Comments: contact ISO Restrictions Weight Bearing Restrictions: Yes RLE Weight Bearing: Weight bearing as tolerated LLE Weight Bearing: Non weight bearing Other Position/Activity Restrictions: Lt BKA 10/03/21       Mobility Bed Mobility               General bed mobility comments: NT, in recliner    Transfers                   General transfer comment: declined, wishing to stay up in the recliner until spouse was here to bring him ice cream     Balance   Sitting-balance support: No upper extremity supported, Feet unsupported, Feet supported Sitting balance-Leahy Scale: Fair                                     ADL either performed or assessed with clinical judgement   ADL Overall ADL's : Needs assistance/impaired     Grooming: Sitting;Set up;Wash/dry face;Wash/dry hands;Oral care;Applying deodorant                                      Extremity/Trunk Assessment              Vision  Perception     Praxis      Cognition Arousal/Alertness: Awake/alert Behavior During Therapy: WFL for tasks assessed/performed Overall Cognitive Status: Within Functional Limits for tasks assessed                                          Exercises Other Exercises Other Exercises: Pt educated in importance of O2, as he had taken his nasal cannula off prior to OT's arrival. SpO2 88% on room air, HR 123. [name] placed back on and pt verbalized understanding.    Shoulder Instructions       General Comments      Pertinent Vitals/ Pain       Pain Assessment Pain Assessment: No/denies pain  Home Living                                           Prior Functioning/Environment              Frequency  Min 3X/week        Progress Toward Goals  OT Goals(current goals can now be found in the care plan section)  Progress towards OT goals: Progressing toward goals  Acute Rehab OT Goals Patient Stated Goal: get stronger OT Goal Formulation: With patient Time For Goal Achievement: 11/16/21 Potential to Achieve Goals: Good  Plan Discharge plan needs to be updated;Frequency needs to be updated    Co-evaluation                 AM-PAC OT "6 Clicks" Daily Activity     Outcome Measure   Help from another person eating meals?: A Little Help from another person taking care of personal grooming?: None Help from another person toileting, which includes using toliet, bedpan, or urinal?: Total Help from another person bathing (including washing, rinsing, drying)?: A Lot Help from another person to put on and taking off regular upper body clothing?: A Little Help from another person to put on and taking off regular lower body clothing?: A Lot 6 Click Score: 15    End of Session Equipment Utilized During Treatment: Oxygen  OT Visit Diagnosis: Unsteadiness on feet (R26.81);Repeated falls (R29.6);Muscle weakness (generalized) (M62.81)   Activity Tolerance Patient tolerated treatment well   Patient Left in chair;with call bell/phone within reach;with chair alarm set   Nurse Communication Mobility status        Time: 9476-5465 OT Time Calculation (min): 12 min  Charges: OT General Charges $OT Visit: 1 Visit OT Treatments $Self Care/Home Management : 8-22 mins  Ardeth Perfect., MPH, MS, OTR/L ascom [phone] 11/06/21, 3:53 PM

## 2021-11-06 NOTE — Inpatient Diabetes Management (Signed)
Inpatient Diabetes Program Recommendations  AACE/ADA: New Consensus Statement on Inpatient Glycemic Control   Target Ranges:  Prepandial:   less than 140 mg/dL      Peak postprandial:   less than 180 mg/dL (1-2 hours)      Critically ill patients:  140 - 180 mg/dL    Latest Reference Range & Units 11/05/21 10:24 11/05/21 11:57 11/05/21 17:27 11/05/21 21:47 11/06/21 07:13  Glucose-Capillary 70 - 99 mg/dL 186 (H) 173 (H) 169 (H) 325 (H) 172 (H)   Review of Glycemic Control  Current orders for Inpatient glycemic control: Semglee 5 units daily, Semglee 25 units QHS, Novolog 10 units TID with meals, Novolog 0-20 units TID with meals  Inpatient Diabetes Program Recommendations:    Insulin: Please consider increasing meal coverage to Novolog 12 units TID with meals.  Thanks, Barnie Alderman, RN, MSN, [place] Diabetes Coordinator Inpatient Diabetes Program [phone] (Team Pager from 8am to [place])

## 2021-11-06 NOTE — Progress Notes (Signed)
   11/05/21 2145  Assess: MEWS Score  Temp 98.9 F (37.2 C)  BP 93/67  MAP (mmHg) 77  Pulse Rate (!) 121  Resp 18  SpO2 95 %  O2 Device Nasal Cannula  Assess: MEWS Score  MEWS Temp 0  MEWS Systolic 1  MEWS Pulse 2  MEWS RR 0  MEWS LOC 0  MEWS Score 3  MEWS Score Color Yellow  Assess: if the MEWS score is Yellow or Red  Were vital signs taken at a resting state? Yes  Focused Assessment No change from prior assessment  Does the patient meet 2 or more of the SIRS criteria? No  MEWS guidelines implemented *See Row Information* No, previously yellow, continue vital signs every 4 hours  Treat  MEWS Interventions Other (Comment) (music)  Pain Scale 0-10  Pain Score 0  Neuro symptoms relieved by Rest  Notify: Charge Nurse/RN  Name of Charge Nurse/RN Notified Pinch, RN  Date Charge Nurse/RN Notified 11/05/21  Document  Patient Outcome Stabilized after interventions  Progress note created (see row info) Yes  Assess: SIRS CRITERIA  SIRS Temperature  0  SIRS Pulse 1  SIRS Respirations  0  SIRS WBC 0  SIRS Score Sum  1

## 2021-11-06 NOTE — Plan of Care (Signed)
  Problem: Education: Goal: Ability to demonstrate management of disease process will improve 11/06/2021 0231 by Bonner Puna, RN Outcome: Progressing 11/06/2021 0231 by Bonner Puna, RN Outcome: Progressing Goal: Ability to verbalize understanding of medication therapies will improve Outcome: Progressing   Problem: Activity: Goal: Capacity to carry out activities will improve Outcome: Progressing   Problem: Cardiac: Goal: Ability to achieve and maintain adequate cardiopulmonary perfusion will improve Outcome: Progressing   Problem: Education: Goal: Knowledge of General Education information will improve Description: Including pain rating scale, medication(s)/side effects and non-pharmacologic comfort measures Outcome: Progressing   Problem: Health Behavior/Discharge Planning: Goal: Ability to manage health-related needs will improve Outcome: Progressing   Problem: Clinical Measurements: Goal: Ability to maintain clinical measurements within normal limits will improve Outcome: Progressing Goal: Will remain free from infection Outcome: Progressing Goal: Diagnostic test results will improve Outcome: Progressing Goal: Respiratory complications will improve Outcome: Progressing Goal: Cardiovascular complication will be avoided Outcome: Progressing   Problem: Activity: Goal: Risk for activity intolerance will decrease Outcome: Progressing   Problem: Nutrition: Goal: Adequate nutrition will be maintained Outcome: Progressing   Problem: Coping: Goal: Level of anxiety will decrease Outcome: Progressing   Problem: Elimination: Goal: Will not experience complications related to bowel motility Outcome: Progressing Goal: Will not experience complications related to urinary retention Outcome: Progressing   Problem: Pain Managment: Goal: General experience of comfort will improve Outcome: Progressing   Problem: Safety: Goal: Ability to remain free from injury will  improve Outcome: Progressing   Problem: Skin Integrity: Goal: Risk for impaired skin integrity will decrease Outcome: Progressing

## 2021-11-07 DIAGNOSIS — R410 Disorientation, unspecified: Secondary | ICD-10-CM | POA: Diagnosis not present

## 2021-11-07 LAB — PHOSPHORUS: Phosphorus: 3.8 mg/dL (ref 2.5–4.6)

## 2021-11-07 LAB — CBC
HCT: 32.1 % — ABNORMAL LOW (ref 39.0–52.0)
Hemoglobin: 9.7 g/dL — ABNORMAL LOW (ref 13.0–17.0)
MCH: 27.9 pg (ref 26.0–34.0)
MCHC: 30.2 g/dL (ref 30.0–36.0)
MCV: 92.2 fL (ref 80.0–100.0)
Platelets: 300 10*3/uL (ref 150–400)
RBC: 3.48 MIL/uL — ABNORMAL LOW (ref 4.22–5.81)
RDW: 17 % — ABNORMAL HIGH (ref 11.5–15.5)
WBC: 6.9 10*3/uL (ref 4.0–10.5)
nRBC: 0 % (ref 0.0–0.2)

## 2021-11-07 LAB — BASIC METABOLIC PANEL
Anion gap: 8 (ref 5–15)
BUN: 22 mg/dL (ref 8–23)
CO2: 29 mmol/L (ref 22–32)
Calcium: 8.6 mg/dL — ABNORMAL LOW (ref 8.9–10.3)
Chloride: 103 mmol/L (ref 98–111)
Creatinine, Ser: 0.7 mg/dL (ref 0.61–1.24)
GFR, Estimated: >60 mL/min (ref >60)
Glucose, Bld: 144 mg/dL — ABNORMAL HIGH (ref 70–99)
Potassium: 4.2 mmol/L (ref 3.5–5.1)
Sodium: 140 mmol/L (ref 135–145)

## 2021-11-07 LAB — GLUCOSE, CAPILLARY
Glucose-Capillary: 155 mg/dL — ABNORMAL HIGH (ref 70–99)
Glucose-Capillary: 201 mg/dL — ABNORMAL HIGH (ref 70–99)
Glucose-Capillary: 153 mg/dL — ABNORMAL HIGH (ref 70–99)
Glucose-Capillary: 144 mg/dL — ABNORMAL HIGH (ref 70–99)

## 2021-11-07 LAB — MAGNESIUM: Magnesium: 2.1 mg/dL (ref 1.7–2.4)

## 2021-11-07 MED ORDER — INSULIN ASPART 100 UNIT/ML IJ SOLN
12.0000 [IU] | Freq: Three times a day (TID) | INTRAMUSCULAR | Status: DC
  Administered 2021-11-07 – 2021-11-13 (×17): 12 [IU] via SUBCUTANEOUS
  Filled 2021-11-07 (×16): qty 1

## 2021-11-07 NOTE — Progress Notes (Signed)
   11/07/21 0526  Assess: MEWS Score  Temp (!) 97.5 F (36.4 C)  BP 124/88  MAP (mmHg) 100  Pulse Rate (!) 117  Resp 18  SpO2 100 %  O2 Device Bi-PAP  Assess: MEWS Score  MEWS Temp 0  MEWS Systolic 0  MEWS Pulse 2  MEWS RR 0  MEWS LOC 0  MEWS Score 2  MEWS Score Color Yellow  Assess: if the MEWS score is Yellow or Red  Were vital signs taken at a resting state? Yes  Focused Assessment No change from prior assessment  Does the patient meet 2 or more of the SIRS criteria? No  MEWS guidelines implemented *See Row Information* No, previously yellow, continue vital signs every 4 hours  Treat  Pain Scale 0-10  Pain Score 0  Take Vital Signs  Increase Vital Sign Frequency   (no previously yellow)  Notify: Charge Nurse/RN  Name of Charge Nurse/RN Notified Christen, RN  Date Charge Nurse/RN Notified 11/06/21  Time Charge Nurse/RN Notified [phone]  Document  Patient Outcome Stabilized after interventions  Progress note created (see row info) Yes  Assess: SIRS CRITERIA  SIRS Temperature  0  SIRS Pulse 1  SIRS Respirations  0  SIRS WBC 0  SIRS Score Sum  1

## 2021-11-07 NOTE — Progress Notes (Signed)
Nutrition Follow-up  DOCUMENTATION CODES:   Morbid obesity, Non-severe (moderate) malnutrition in context of acute illness/injury  INTERVENTION:   Ensure Max protein supplement BID, each supplement provides 150kcal and 30g of protein.  MVI po daily   Vitamin C '500mg'$  po daily   Double protein with meals  NUTRITION DIAGNOSIS:   Moderate Malnutrition related to acute illness (L heel osteomyelitis) as evidenced by energy intake < 75% for > 7 days, mild fat depletion.  GOAL:   Patient will meet greater than or equal to 90% of their needs -Progressing   MONITOR:   PO intake, Supplement acceptance, Labs, Weight trends, Skin, I & O's  ASSESSMENT:   66 y/o male with h/o HTN, CHF, Afib, DM, CAD s/p CABG, PAD, OSA, BPH, gout, depression and COPD who is admitted with acute on chronic hypoxic respiratory failure secondary to acute diastolic CHF exacerbation, pneumonia, MRSA bacteremia, L heel osteomyelitis s/p BKA 6/6, UTI and right-sided pleural effusion s/p right sided thoracentesis w/ removal of 1.2L of pleural fluid 10/25/21.  Pt continues to have good appetite and oral intake; pt eating 100% of meals and is drinking the Ensure Max protein. Per chart, pt remains weight stable since his BKA. Insurance authorization pending for [employer].   Medications reviewed and include: vitamin C, aspirin, D3, ferrous sulfate, insulin, synthroid, MVI, B12  Labs reviewed: K 4.2 wnl, P 3.8 wnl, Mg 2.1 wnl Hgb 9.7(L), Hct 32.1(L) Cbgs- 153, 201, 155 x 24 hrs  Diet Order:   Diet Order             Diet Carb Modified Fluid consistency: Thin; Room service appropriate? Yes; Fluid restriction: 1800 mL Fluid  Diet effective now                  EDUCATION NEEDS:   Not appropriate for education at this time  Skin:  Skin Assessment: Skin Integrity Issues: Incisions: s/p lt BKA Other: venous statsis ulcer to rt pretibial, MASD to abdominal folds and groin, Stage II coccyx  Last BM:  7/10- type  1  Height:   Ht Readings from Last 1 Encounters:  10/16/21 '6\' 4"'$  (1.93 m)    Weight:   Wt Readings from Last 1 Encounters:  11/07/21 (!) 173.7 kg    Ideal Body Weight:  91.8 kg  BMI:  Body mass index is 46.62 kg/m.  Estimated Nutritional Needs:   Kcal:  2700-3000kcal/day  Protein:  >135g/day  Fluid:  2.0L/day  Koleen Distance MS, RD, LDN Please refer to [facility] for RD and/or RD on-call/weekend/after hours pager

## 2021-11-07 NOTE — Progress Notes (Signed)
[facility] Cardiology  SUBJECTIVE:  - awake and alert without complaints - very eager to get out of the hospital and get his prosthesis fitted - no chest pain or SOB   Vitals:   11/07/21 0451 11/07/21 0526 11/07/21 0723 11/07/21 0745  BP:  124/88 (!) 144/98   Pulse:  (!) 117 (!) 117 89  Resp:  '18 18 14  '$ Temp:  (!) 97.5 F (36.4 C) 97.8 F (36.6 C)   TempSrc:  Oral Oral   SpO2:  100% 98% 97%  Weight: (!) 173.7 kg     Height:         Intake/Output Summary (Last 24 hours) at 11/07/2021 1005 Last data filed at 11/07/2021 0700 Gross per 24 hour  Intake --  Output 2175 ml  Net -2175 ml     PHYSICAL EXAM  General: Awake and alert, laying in bed HEENT:  Normocephalic and atramatic Neck:  No JVD.  Lungs: Clear bilaterally to auscultation without appreciable crackles or wheezes Heart: Tachycardic but regular . Normal S1 and S2 without gallops or murmurs.  Abdomen: morbidly obese appearing Msk:  Woody induration of RLE, S/p L BKA well healed incision on stump Extremities: moves all extremities spontaneously, Left BKA Neuro: No focal deficits.  Psych:  Seems appropriate    LABS: Basic Metabolic Panel: Recent Labs    11/06/21 0415 11/07/21 0352  NA 138 140  K 4.6 4.2  CL 101 103  CO2 31 29  GLUCOSE 192* 144*  BUN 25* 22  CREATININE 0.68 0.70  CALCIUM 8.4* 8.6*  MG 2.0 2.1  PHOS 3.4 3.8    Liver Function Tests: No results for input(s): "AST", "ALT", "ALKPHOS", "BILITOT", "PROT", "ALBUMIN" in the last 72 hours.  No results for input(s): "LIPASE", "AMYLASE" in the last 72 hours. CBC: Recent Labs    11/05/21 0405 11/07/21 0352  WBC 7.2 6.9  HGB 9.5* 9.7*  HCT 32.0* 32.1*  MCV 93.6 92.2  PLT 332 300    Cardiac Enzymes: No results for input(s): "CKTOTAL", "CKMB", "CKMBINDEX", "TROPONINI" in the last 72 hours. BNP: Invalid input(s): "POCBNP" D-Dimer: No results for input(s): "DDIMER" in the last 72 hours. Hemoglobin A1C: No results for input(s): "HGBA1C" in  the last 72 hours. Fasting Lipid Panel: No results for input(s): "CHOL", "HDL", "LDLCALC", "TRIG", "CHOLHDL", "LDLDIRECT" in the last 72 hours. Thyroid Function Tests: No results for input(s): "TSH", "T4TOTAL", "T3FREE", "THYROIDAB" in the last 72 hours.  Invalid input(s): "FREET3"  Anemia Panel: No results for input(s): "VITAMINB12", "FOLATE", "FERRITIN", "TIBC", "IRON", "RETICCTPCT" in the last 72 hours.   No results found.   Echo LVEF 55-60% 10/05/2021  TELEMETRY: Atrial flutter 116 bpm:  ASSESSMENT AND PLAN:  Principal Problem:   Acute delirium Active Problems:   Essential hypertension, benign   RLS (restless legs syndrome)   Osteomyelitis (HCC)   Obesity, Class III, BMI 40-49.9 (morbid obesity) (HCC)   Acute on chronic diastolic CHF (congestive heart failure) (HCC)   AF (paroxysmal atrial fibrillation) ([place])   Uncontrolled type 2 diabetes mellitus with hypoglycemia, with long-term current use of insulin (HCC)   Malnutrition of moderate degree   Generalized weakness   Acute respiratory failure with hypoxia and hypercapnia (HCC)   OSA (obstructive sleep apnea)   Sepsis due to methicillin resistant Staphylococcus aureus (MRSA) (HCC)   Acute on chronic respiratory failure with hypoxia and hypercapnia (HCC)   Acute urinary retention   Hyperkalemia   Iron deficiency anemia   UTI (urinary tract infection)   Pressure  injury of skin of heel    1. Acute on chronic diastolic congestive heart failure, HFpEF, LVEF 55-60% 10/05/21, good diuresis after IV furosemide but remains on supplemental O2 2.  Persistent atrial flutter, currently in atrial flutter heart rate 116 bpm, on amiodarone for rate control and Eliquis for stroke prevention (home rate control diltiazem '180mg'$  and metoprolol tartrate '100mg'$  BID) 3.  Coronary artery disease, status post CABG 2006, PCI RCA 01/2020, currently without chest pain 4.  Left foot osteomyelitis s/p IV linezolid, left BKA 10/03/2021 by Dr. Delana Meyer 5.   Acute kidney injury, improving to Cr 0.77 and >60  6.  MRSA bacteremia, osteomyelitis of L foot likely source, treated with IV daptomycin, repeat echo ordered by ID, resulted with thickened aortic and mitral leaflets without obvious vegetation.      Recommendations   1.  Agree with current therapy 2.  Continue Eliquis for stroke prevention 3.  Continue amiodarone 200 mg daily for rate control.  - Continue diltiazem 90 q12;  - Holding metoprolol, may need to resume 4.  Diuresis as needed. s/p torsemide 60 mg PO 7/7 with >> 4 L net negative.  Continue to hold. Can probably discharge on '20mg'$  torsemide daily  5.  Wean oxygen as tolerated 6.  Pending rehab vs LTAC vs CIR placement   This patient's plan of care was discussed and created with Dr. Nehemiah Massed and he is in agreement.    Alec Schroeder, PA-C 11/07/2021 10:05 AM

## 2021-11-07 NOTE — Progress Notes (Signed)
Inpatient Rehab Admissions Coordinator:    I spoke with pt.'s wife, Dondra Spry, at length regarding potential CIR admit. Pt. Is currently requiring max A with scoot transfers and I think that reasonable goals for CIR would be min-mod assist. When I discussed expectation with pt.'s wife, she stated that she has back problems and is able to provide supervision for most tasks, possibly up to min assist for some ADLs. She states that no other family members can be with Pt. All the time.  I will not pursue this Pt. For CIR admission due to lack of adequate support at discharge. CIR will sign off. Pt.'s wife prefers SNF level rehab closer to home.   Clemens Catholic, [place], [place] Admissions Coordinator  [phone] ([place]) [phone] (office)

## 2021-11-07 NOTE — TOC Progression Note (Addendum)
Transition of Care (TOC) - Progression Note    Patient Details  Name: [name]. MRN: [id number] Date of Birth: 07-06-1955  [facility] CM/SW [place], [place] Phone Number: 11/07/2021, 10:21 AM  Clinical Narrative:     Update: plan for CIR to assess patient for appropriateness.   Per Delsa Sale with [facility] patient's insurance auth continues to pend for LTAC. Reports it could take up to 7/19 until auth achieved for LTAC with patient's [place].  Expected Discharge Plan: [place] Barriers to Discharge: Continued Medical Work up  Expected Discharge Plan and Services Expected Discharge Plan: [place] In-house Referral: NA   Post Acute Care Choice: Resumption of Svcs/PTA Provider Living arrangements for the past 2 months: Single Family Home                                       Social Determinants of Health (SDOH) Interventions    Readmission Risk Interventions    09/29/2021   11:12 AM  Readmission Risk Prevention Plan  Transportation Screening Complete  PCP or Specialist Appt within 3-5 Days Complete  HRI or [place] Complete  Social Work Consult for [place] Planning/Counseling Complete  Palliative Care Screening Not Applicable  Medication Review Press photographer) Complete

## 2021-11-07 NOTE — Progress Notes (Signed)
PROGRESS NOTE    [name].  UVO:[id number] DOB: 1955-05-04  DOA: 09/28/2021 Date of Service: 11/07/21 PCP: [name], MD     Hospital Course:  Alec Less. is a 66 y.o. male with medical history significant for CHF with last known LVEF of 50 to 55%, HTN, CAD, MDD, COPD, A-fib, morbid obesity (BMI 24), OSA, status post recent wound debridement and partial calcanectomy (05/16) on oral antibiotic therapy after completion of IV. He presented to the ER 09/28/2021 for bilateral lower extremity swelling, increased redness and swelling involving the left lower extremity mostly over the heel. Admitted 09/28/2021  06/1: Pt admitted to the [facility] unit with acute on chronic diastolic CHF and left heel osteomyelitis  06/2: Podiatry consulted for left foot osteomyelitis, plan for higher amputation.  Vascular surgery consulted  06/4: ID consulted for MRSA bacteremia likely source left heel wound  06/6: Pt underwent left BKA per vascular surgery  06/6 to 06/17: Pt with worsening acute on chronic hypoxic respiratory failure secondary to volume overload due to diastolic CHF.  CXR revealed new pleural effusion.  IV lasix resumed.  Requiring Bipap or [place]  06/21 to 06/27: mentation waxed and waned requiring Bipap during the day due to hypoxia  06/27: Pt requiring 100% HHFNC and transfer to the stepdown unit.  PCCM team consulted to assist with management  6/28: Pt tolerating [place] @ 50L/85%; Underwent Right-sided thoracentesis with removal of 1.2L of pleural fluid 6/29: Tolerating 45L/70% FiO2 with O2 sats 100%; remains on levophed gtt '@4'$  mcg/min to maintain map >65 6/30: altered mental status, but not from hypercapnia  7/1: Pt with intermittent agitation with worsening delirium that started 6/20.  Received prn seroquel and haldol overnight due to sleep deprivation.  This am pt calm with improvement in mentation  7/2: Pt lethargic after receiving prn haldol x1 dose and seroquel x1  dose.  Tolerating Bipap '@30'$ % FiO2.  If pt remains stable throughout the day will transfer service to [facility] starting 07/3 and PCCM team will sign off  7/3-7/4: supplemental oxygen and currently on bubbler at 10L. Can be transferred to PCU. Mental status has much improved today. PT/OT will likely need to come re-evaluate but will likely need LTAC vs SNF. CM is working on this. 7/5: still requiring 12L O2 this AM but --> 5L later in the day. 7/6: down to 4L [place] in the day, still on BiPap qhs/when asleep. PT/OT recommending SNF/LTAC, TOC working on LTAC but given O2 requirement have improved he may be able to go to SNF, TOC will look into this. Increased beta blocker given tachycardia. Cardiology also following.   7/7: potassium has been slightly elevated, worsening today. Restarted torsemide. D/w pharmacy re: risk QT prolongation w/ BB, amiodarone, antipsychotics - appreciate cardiology input: ok to d/c midodrine, restart diltiazem and d/c metoprolol, restart loop diuretic 7/8-7/9: . K is improved/down to WNL. Cr WNL, BUN trended up. Concern he is volume contracted, cardio d/c torsemide 7/8. Cardio recs: amiodarone 200 gm daily, diltiazem 90 q12 may need to increase, metoprolol held (was increased d/t HR, concern for high K and since holding this K has improved), diuresis as needed w/ torsemide which is held for now. HR remains elevated but otherwise he is approaching medical stability and TOC is working on placement.  7/10: Brief episode of confusion/hallucination which resolved spontaneously (patient states he was seeing finishes either outside the window or at his bedside table), ABG normal, UA obtained no UTI, remove Foley today  hopefully can leave this out if urinary retention has resolved 7/11: am labs no concerns, awaiting placement, still tachycardic will leave tele on but transfer to med surg     Consultants:  Podiatry Vascular Surgery Infectious Disease Cardiology Palliative Care    Procedures: 10/03/21: left BKA per vascular surgery    Subjective: PT reports feeling okay today, he repeats as he has the past few days that really wants to be out of the hospital. No CP/SOB.      ASSESSMENT & PLAN:   Principal Problem:   Acute delirium Active Problems:   Sepsis due to methicillin resistant Staphylococcus aureus (MRSA) ([place])   Acute on chronic respiratory failure with hypoxia and hypercapnia (HCC)   UTI (urinary tract infection)   Acute on chronic diastolic CHF (congestive heart failure) ([place])   Uncontrolled type 2 diabetes mellitus with hypoglycemia, with long-term current use of insulin (HCC)   AF (paroxysmal atrial fibrillation) (HCC)   Obesity, Class III, BMI 40-49.9 (morbid obesity) ([place])   Essential hypertension, benign   Osteomyelitis (HCC)   RLS (restless legs syndrome)   Acute urinary retention   Malnutrition of moderate degree   Generalized weakness   Acute respiratory failure with hypoxia and hypercapnia (HCC)   OSA (obstructive sleep apnea)   Hyperkalemia   Iron deficiency anemia   Pressure injury of skin of heel  Sepsis POA, resolved: due to methicillin resistant Staphylococcus aureus. RESOLVED Blood cxs (+)MRSA. Repeat blood cx NGTD, Echo no vegetations on TTE, unable to to TEE. Likely secondary to osteomyelitis. Continue on linezolid (was on daptomycin but concern for eosinophilia so plan for linezolid until 11/01/21. Poor prognosis. High risk for intubation & cardiac arrest. See infectious disease notes 07/03: ID signed off    Acute hypoxic/hypercapnic  resp failure- combination of CHF, hypoventilation syndrome,OSA , CO2 retention. IMPROVED/STABLE  Ruled out amiodarone induced pneumonitis and dapto induced eosinophilic pneumonia.  BiPAP qhs / while asleep    Acute on chronic diastolic CHF: EF 55 to 49% 04/2021 - RESOLVED/STABLE Cardiology following. Has been relatively well-compensated past few days.  Cardio recs: amiodarone 200 gm daily,  diltiazem 90 q12 may need to increase, metoprolol held (was increased d/t HR, concern for high K and since holding this K has improved), diuresis as needed w/ torsemide which is held for now.   Tachycardia and occasional elevated RR = SIRS, sepsis r/o HR remains elevated but otherwise he is approaching medical stability and TOC is working on placement.   Hyperkalemia - IMPROVED/RESOLVED potassium has been slightly elevated, worsening 7/7.  Stopped beta-blocker due to potential this may be contributing to hyperkalemia, started diltiazem, added back torsemide.  Trend BMP in a.m. --> improved, torsemide d/c 7/8  PAF:  HTN:  continue on eliquis, amio, diltiazem.  Restarted metoprolol given tachycardia and BP has been better however discontinued this due to concerns for mild but worsening hyperkalemia.  Placed on diltiazem. Cardio following.  Ideally would increase diltiazem but BP has been low Previously was on pressors but since have been weaned off  UTI - RESOLVED:  urine cx (+)citrobacter. Completed abx course 3 days cefepime.     Acute delirium -RESOLVED:  labile mental status. Continue on risperidone. Seroquel qhs prn     DM2:  poorly controlled, HbA1c 10.2. Continue on glargine, aspart, SSI w/ accuchecks   Morbid obesity:  BMI 41.4. Complicates overall care & prognosis    Osteomyelitis of LLE: s/p L BLKA.  Completed linezolid 11/01/21 as per ID  RLS: holding home dose of mirapex   Acute urinary retention: foley catheter placed on 10/08/2021.  Foley DC'd on 6/16.  Foley needed to be replaced on 10/24/2021   IDA: H&H had been labile. No need for a tranfusion    OSA: CPAP qhs. Qualifies for NIV with elevated PCO2 on ABG.    Generalized weakness: PT/OT will have to re-evaluate pt   Malnutrition of moderate degree: related to acute illness (left heel osteomyelitis) and energy intake of less than 75% for more than 7 days, mild fat depletion.  Continue on nutritional supplements            DVT prophylaxis: Eliquis Code Status: FULL Family Communication wife at bedside today Disposition Plan / TOC needs: likely will need d/c to SNF/LTAC, declined inpatient rehab  Barriers to discharge / significant pending items: requiring high supplemental O2, may need LTAC vs SNF              Objective: Vitals:   11/07/21 0723 11/07/21 0745 11/07/21 1127 11/07/21 1444  BP: (!) 144/98  104/78 124/78  Pulse: (!) 117 89 (!) 118 (!) 119  Resp: '18 14 16 18  '$ Temp: 97.8 F (36.6 C)  98.7 F (37.1 C) 98.2 F (36.8 C)  TempSrc: Oral   Oral  SpO2: 98% 97% 97% 98%  Weight:      Height:        Intake/Output Summary (Last 24 hours) at 11/07/2021 1620 Last data filed at 11/07/2021 0700 Gross per 24 hour  Intake --  Output 1300 ml  Net -1300 ml    Filed Weights   10/28/21 0500 11/03/21 0350 11/07/21 0451  Weight: (!) 154.4 kg (!) 167.8 kg (!) 173.7 kg    Examination:  Constitutional:  VS as above General Appearance: alert, well-developed, well-nourished, NAD Eyes: Normal lids and conjunctive, non-icteric sclera Neck: No masses, trachea midline Respiratory: Normal respiratory effort Breath sounds normal, no wheeze/rhonchi/rales Cardiovascular: S1/S2 normal Trace lower extremity edema RLE Gastrointestinal: Nontender, no masses but habitus limits exam.  Musculoskeletal:  No clubbing/cyanosis of digits Left BKA Neurological: No cranial nerve deficit on limited exam Psychiatric: Normal judgment/insight Depressed/flat mood and affect       Scheduled Medications:   amiodarone  200 mg Oral Daily   apixaban  5 mg Oral BID   vitamin C  500 mg Oral Daily   aspirin EC  81 mg Oral Daily   budesonide (PULMICORT) nebulizer solution  0.5 mg Nebulization BID   chlorhexidine  15 mL Mouth Rinse BID   Chlorhexidine Gluconate Cloth  6 each Topical Daily   cholecalciferol  1,000 Units Oral Daily   diltiazem  90 mg Oral Q12H   ezetimibe  10 mg Oral Daily    ferrous sulfate  325 mg Oral Q breakfast   finasteride  5 mg Oral Daily   insulin aspart  0-20 Units Subcutaneous TID WC   insulin aspart  12 Units Subcutaneous TID WC   insulin glargine-yfgn  25 Units Subcutaneous QHS   insulin glargine-yfgn  5 Units Subcutaneous Daily   ipratropium-albuterol  3 mL Nebulization BID   levothyroxine  25 mcg Oral Q0600   multivitamin with minerals  1 tablet Oral Daily   nystatin   Topical BID   primidone  250 mg Oral BID   Ensure Max Protein  11 oz Oral BID   risperiDONE  0.5 mg Oral BID   sodium chloride flush  10-40 mL Intracatheter Q12H   vitamin B-12  1,000  mcg Oral Daily    Continuous Infusions:  sodium chloride Stopped (10/25/21 2209)    PRN Medications:  acetaminophen **OR** acetaminophen, albuterol, haloperidol lactate, metoprolol tartrate, nitroGLYCERIN, ondansetron **OR** ondansetron (ZOFRAN) IV, mouth rinse, QUEtiapine, sodium chloride flush  Antimicrobials:  Anti-infectives (From admission, onward)    Start     Dose/Rate Route Frequency Ordered Stop   10/26/21 2200  linezolid (ZYVOX) tablet 600 mg        600 mg Oral Every 12 hours 10/26/21 1743 11/01/21 2139   10/24/21 2200  linezolid (ZYVOX) IVPB 600 mg  Status:  Discontinued        600 mg 300 mL/hr over 60 Minutes Intravenous Every 12 hours 10/24/21 2157 10/26/21 1743   10/24/21 1800  ceFEPIme (MAXIPIME) 2 g in sodium chloride 0.9 % 100 mL IVPB        2 g 200 mL/hr over 30 Minutes Intravenous Every 8 hours 10/24/21 1612 10/26/21 1730   10/24/21 1030  sulfamethoxazole-trimethoprim (BACTRIM DS) 800-160 MG per tablet 1 tablet  Status:  Discontinued        1 tablet Oral Every 12 hours 10/24/21 0944 10/24/21 1608   10/23/21 2000  DAPTOmycin (CUBICIN) 800 mg in sodium chloride 0.9 % IVPB  Status:  Discontinued        6 mg/kg  129.2 kg (Adjusted) 132 mL/hr over 30 Minutes Intravenous Every 24 hours 10/23/21 0745 10/24/21 1803   10/22/21 0945  cefTRIAXone (ROCEPHIN) 1 g in sodium  chloride 0.9 % 100 mL IVPB        1 g 200 mL/hr over 30 Minutes Intravenous Every 24 hours 10/22/21 0848 10/24/21 1845   10/04/21 1000  DAPTOmycin (CUBICIN) 800 mg in sodium chloride 0.9 % IVPB  Status:  Discontinued        6 mg/kg  129.2 kg (Adjusted) 132 mL/hr over 30 Minutes Intravenous Every 24 hours 10/03/21 1648 10/23/21 0745   10/03/21 0000  vancomycin (VANCOREADY) IVPB 1500 mg/300 mL        1,500 mg 150 mL/hr over 120 Minutes Intravenous Every 12 hours 10/02/21 1348 10/03/21 2359   09/30/21 1330  cefTRIAXone (ROCEPHIN) 2 g in sodium chloride 0.9 % 100 mL IVPB  Status:  Discontinued        2 g 200 mL/hr over 30 Minutes Intravenous Daily 09/30/21 1233 10/03/21 1849   09/30/21 1200  vancomycin (VANCOREADY) IVPB 1500 mg/300 mL  Status:  Discontinued        1,500 mg 150 mL/hr over 120 Minutes Intravenous Every 12 hours 09/30/21 0120 09/30/21 0927   09/30/21 1200  vancomycin (VANCOREADY) IVPB 1250 mg/250 mL  Status:  Discontinued        1,250 mg 166.7 mL/hr over 90 Minutes Intravenous Every 12 hours 09/30/21 0927 10/02/21 1348   09/30/21 0000  vancomycin (VANCOREADY) IVPB 1250 mg/250 mL       See Hyperspace for full Linked Orders Report.   1,250 mg 166.7 mL/hr over 90 Minutes Intravenous  Once 09/29/21 2308 09/30/21 0538   09/30/21 0000  vancomycin (VANCOREADY) IVPB 1250 mg/250 mL       See Hyperspace for full Linked Orders Report.   1,250 mg 166.7 mL/hr over 90 Minutes Intravenous  Once 09/29/21 2308 09/30/21 0255   09/29/21 0200  cefTRIAXone (ROCEPHIN) 2 g in sodium chloride 0.9 % 100 mL IVPB  Status:  Discontinued       See Hyperspace for full Linked Orders Report.   2 g 200 mL/hr over 30 Minutes Intravenous Every  24 hours 09/28/21 2051 09/29/21 2309   09/29/21 0200  metroNIDAZOLE (FLAGYL) IVPB 500 mg  Status:  Discontinued       See Hyperspace for full Linked Orders Report.   500 mg 100 mL/hr over 60 Minutes Intravenous Every 12 hours 09/28/21 2051 10/03/21 1849   09/28/21  1830  vancomycin (VANCOCIN) IVPB 1000 mg/200 mL premix        1,000 mg 200 mL/hr over 60 Minutes Intravenous  Once 09/28/21 1826 09/28/21 1937   09/28/21 1745  piperacillin-tazobactam (ZOSYN) IVPB 3.375 g        3.375 g 100 mL/hr over 30 Minutes Intravenous  Once 09/28/21 1736 09/28/21 1838       Data Reviewed: I have personally reviewed following labs and imaging studies  CBC: Recent Labs  Lab 11/01/21 0452 11/02/21 0400 11/03/21 0351 11/05/21 0405 11/07/21 0352  WBC 9.1 8.9 8.9 7.2 6.9  HGB 9.3* 9.4* 9.5* 9.5* 9.7*  HCT 32.6* 33.3* 33.0* 32.0* 32.1*  MCV 96.2 96.0 95.4 93.6 92.2  PLT 305 333 352 332 160    Basic Metabolic Panel: Recent Labs  Lab 11/03/21 0351 11/04/21 0455 11/05/21 0405 11/06/21 0415 11/07/21 0352  NA 138 138 138 138 140  K 5.4* 4.4 4.7 4.6 4.2  CL 103 100 99 101 103  CO2 31 33* 33* 31 29  GLUCOSE 142* 92 168* 192* 144*  BUN 29* 33* 32* 25* 22  CREATININE 0.72 0.95 0.75 0.68 0.70  CALCIUM 8.4* 8.8* 8.3* 8.4* 8.6*  MG 1.9 1.7 1.9 2.0 2.1  PHOS 4.3 5.1* 4.1 3.4 3.8    GFR: Estimated Creatinine Clearance: 156.2 mL/min (by C-G formula based on SCr of 0.7 mg/dL). Liver Function Tests: Recent Labs  Lab 11/01/21 0452  AST 14*  ALT 31  ALKPHOS 289*  BILITOT 0.5  PROT 6.8  ALBUMIN 2.4*    No results for input(s): "LIPASE", "AMYLASE" in the last 168 hours. No results for input(s): "AMMONIA" in the last 168 hours. Coagulation Profile: No results for input(s): "INR", "PROTIME" in the last 168 hours. Cardiac Enzymes: No results for input(s): "CKTOTAL", "CKMB", "CKMBINDEX", "TROPONINI" in the last 168 hours. BNP (last 3 results) No results for input(s): "PROBNP" in the last 8760 hours. HbA1C: No results for input(s): "HGBA1C" in the last 72 hours. CBG: Recent Labs  Lab 11/06/21 1524 11/06/21 2056 11/07/21 0724 11/07/21 1130 11/07/21 1445  GLUCAP 184* 174* 155* 201* 153*    Lipid Profile: No results for input(s): "CHOL", "HDL",  "LDLCALC", "TRIG", "CHOLHDL", "LDLDIRECT" in the last 72 hours. Thyroid Function Tests: No results for input(s): "TSH", "T4TOTAL", "FREET4", "T3FREE", "THYROIDAB" in the last 72 hours. Anemia Panel: No results for input(s): "VITAMINB12", "FOLATE", "FERRITIN", "TIBC", "IRON", "RETICCTPCT" in the last 72 hours. Urine analysis:    Component Value Date/Time   COLORURINE STRAW (A) 11/06/2021 1900   APPEARANCEUR CLEAR (A) 11/06/2021 1900   LABSPEC 1.008 11/06/2021 1900   PHURINE 7.0 11/06/2021 1900   GLUCOSEU NEGATIVE 11/06/2021 1900   HGBUR NEGATIVE 11/06/2021 1900   BILIRUBINUR NEGATIVE 11/06/2021 1900   KETONESUR NEGATIVE 11/06/2021 1900   PROTEINUR 100 (A) 11/06/2021 1900   NITRITE NEGATIVE 11/06/2021 1900   LEUKOCYTESUR LARGE (A) 11/06/2021 1900   Sepsis Labs: '@LABRCNTIP'$ (procalcitonin:4,lacticidven:4)  No results found for this or any previous visit (from the past 240 hour(s)).        Radiology Studies last 96 hours: DG Knee Right Port  Result Date: 11/04/2021 CLINICAL DATA:  [place] out of bed, evaluate for fracture  EXAM: PORTABLE RIGHT KNEE - 1-2 VIEW COMPARISON:  None Available. FINDINGS: There is no evidence of acute fracture. Alignment is normal. There is mild tricompartment osteoarthritis. There is a trace joint effusion. There is mild prepatellar soft tissue swelling. There is a vascular stent noted posteriorly. There is chronic appearing cortical thickening of the tibial diaphysis, partially visualized. IMPRESSION: No evidence of acute fracture. Mild prepatellar soft tissue swelling. Mild tricompartment osteoarthritis.  Trace joint effusion. Electronically Signed   By: Maurine Simmering M.D.   On: 11/04/2021 08:42   DG Knee Left Port  Result Date: 11/04/2021 CLINICAL DATA:  Fall from bed, evaluate for fracture EXAM: PORTABLE LEFT KNEE - 1-2 VIEW COMPARISON:  None Available. FINDINGS: Below the knee amputation. Subcutaneous reticulation is generalized-usually chronic. Dystrophic and  atheromatous calcifications. No acute fracture or dislocation. IMPRESSION: No acute finding. Electronically Signed   By: Jorje Guild M.D.   On: 11/04/2021 05:16            LOS: 40 days       Emeterio Reeve, DO [facility] 11/07/2021, 4:20 PM   Staff may message me via secure chat in [place]  but this may not receive immediate response,  please page for urgent matters!  If 7PM-7AM, please contact night-coverage [email or web address]  Dictation software was used to generate the above note. Typos may occur and escape review, as with typed/written notes. Please contact Dr Sheppard Coil directly for clarity if needed.

## 2021-11-07 NOTE — Progress Notes (Signed)
Physical Therapy Treatment Patient Details Name: [name]. MRN: [id number] DOB: [birth year] Today's Date: 11/07/2021   History of Present Illness Herley Bernardini is a 63yoM who comes to [facility] on 09/28/21 c progression of Left chornic heel ulcer. MRI showed significant progressive osteomyelitis in the left calcaneus. S/p L BKA 6/6. PT signed off 10/24/21 due to decline in respiratory status and transition to higher level of care due to increased demands for supplemental oxygen and high risk for deterioration and intubation.PMH HTN, IDDM2, morbid obesity, depression, CAD s/p CABG 2006 and PCI to RCA in 12/2020, chronic B venous stasis on Unna boots, chronic L foot ulcer, COPD, OSA on CPAP nightly, ai-fib, PAD, s/p multiple toe amputations.    PT Comments    Pt is A and O x 3. Supportive granddaughter arrived just after author. He is eager to get OOB to recliner. Discussed at length pt's need to continue to increase daily activity to promote return in strength. He states understanding. Also discussed need for rehab at DC and explained that if accepted into CIR, would greatly benefit. Pt and spouse were concerned about distance from home however granddaughter was able to convince pt he would improve quicker with more aggressive PT than SNF can provide.  Pt is a great CIR candidate. He was able to roll R to short sit EOB with max assist of one. Sat EOB with mostly CGA however several minutes occasionally of supervision only. Session progress to lateral transfer from EOB to recliner with max of 1. Increased time required to perform however pt does well. HR around 115 bpm throughout session(even with activity). Pt sat in recliner x 2.5 hours prior to hoyer lift transfer back to bed. Overall pt is progressing well. Will require extensive PT going forward to maximize independence while decreasing caregiver burden. HIGHLY recommend DC to CIR.   Recommendations for follow up therapy are one component of a  multi-disciplinary discharge planning process, led by the attending physician.  Recommendations may be updated based on patient status, additional functional criteria and insurance authorization.  Follow Up Recommendations  Acute inpatient rehab (3hours/day) Can patient physically be transported by private vehicle: No   Assistance Recommended at Discharge Frequent or constant Supervision/Assistance  Patient can return home with the following Two people to help with bathing/dressing/bathroom;Two people to help with walking and/or transfers;Assistance with cooking/housework;Assist for transportation;Help with stairs or ramp for entrance;Direct supervision/assist for medications management;Direct supervision/assist for financial management   Equipment Recommendations  None recommended by PT       Precautions / Restrictions Precautions Precautions: Fall Precaution Comments: contact ISO Restrictions Weight Bearing Restrictions: Yes RLE Weight Bearing: Weight bearing as tolerated LLE Weight Bearing: Non weight bearing Other Position/Activity Restrictions: Lt BKA 10/03/21     Mobility  Bed Mobility Overal bed mobility: Needs Assistance Bed Mobility: Rolling, Sidelying to Sit, Supine to Sit, Sit to Supine Rolling: Mod assist Sidelying to sit: Max assist Supine to sit: Max assist Sit to supine: Min assist   General bed mobility comments: Pt was able to roll R to short sit with max assist. increased time required with vcs throughout for sequencing.    Transfers Overall transfer level: Needs assistance Equipment used: None Transfers: Bed to chair/wheelchair/BSC            Lateral/Scoot Transfers: Max assist, From elevated surface General transfer comment: Pt was able to lateral scoot from EOB to recliner with increased time and Max assist. vcs throughout for handplacement, fwd wt shift, and  overall technique improvemenmts. HR around 115bpm throughout even with exertion. Pt sat in  recliner x 2.5 hours prior to hoyer lift transfer back to bed. pt's strength is improving however not enough strength to attempt standing Transfer via Lift Equipment:  (hoyer lift)  Ambulation/Gait      General Gait Details: unable/unsafe    Balance Overall balance assessment: Needs assistance Sitting-balance support: No upper extremity supported, Feet unsupported, Feet supported Sitting balance-Leahy Scale: Fair Sitting balance - Comments: CGA mostly however supervision at times. occasional LOB posteriorly Postural control: Posterior lean Standing balance support: Bilateral upper extremity supported (+ RLE support)   Standing balance comment: unable/unsafe to stand currently       Cognition Arousal/Alertness: Awake/alert Behavior During Therapy: WFL for tasks assessed/performed Overall Cognitive Status: Within Functional Limits for tasks assessed Area of Impairment: Safety/judgement, Awareness, Problem solving        Orientation Level: Disoriented to, Situation Current Attention Level: Sustained   Following Commands: Follows one step commands consistently, Follows one step commands with increased time Safety/Judgement: Decreased awareness of safety     General Comments: Pt is A and O x 3. Agreeable to session and motivated to get OOB.           General Comments General comments (skin integrity, edema, etc.): author re-educated pt on importance of increasing activity daily. He demonstarted several ther ex once back in bed prior to requesting to rest from fatigue      Pertinent Vitals/Pain Pain Assessment Pain Assessment: No/denies pain Pain Score: 0-No pain     PT Goals (current goals can now be found in the care plan section) Acute Rehab PT Goals Patient Stated Goal: to get better Progress towards PT goals: Progressing toward goals    Frequency    7X/week      PT Plan Current plan remains appropriate    Co-evaluation     PT goals addressed during  session: Mobility/safety with mobility;Balance;Proper use of DME;Strengthening/ROM        AM-PAC PT "6 Clicks" Mobility   Outcome Measure  Help needed turning from your back to your side while in a flat bed without using bedrails?: A Lot Help needed moving from lying on your back to sitting on the side of a flat bed without using bedrails?: A Lot Help needed moving to and from a bed to a chair (including a wheelchair)?: A Lot Help needed standing up from a chair using your arms (e.g., wheelchair or bedside chair)?: Total Help needed to walk in hospital room?: Total Help needed climbing 3-5 steps with a railing? : Total 6 Click Score: 9    End of Session Equipment Utilized During Treatment: Gait belt;Oxygen Activity Tolerance: Patient tolerated treatment well Patient left: in bed;with call bell/phone within reach;with bed alarm set Nurse Communication: Mobility status PT Visit Diagnosis: Difficulty in walking, not elsewhere classified (R26.2);Other abnormalities of gait and mobility (R26.89);Muscle weakness (generalized) (M62.81)     Time: 5277-8242 PT Time Calculation (min) (ACUTE ONLY): 28 min  Charges:  $Therapeutic Activity: 23-37 mins                     Julaine Fusi PTA 11/07/21, 11:02 AM

## 2021-11-07 NOTE — Inpatient Diabetes Management (Signed)
Inpatient Diabetes Program Recommendations  AACE/ADA: New Consensus Statement on Inpatient Glycemic Control   Target Ranges:  Prepandial:   less than 140 mg/dL      Peak postprandial:   less than 180 mg/dL (1-2 hours)      Critically ill patients:  140 - 180 mg/dL    Latest Reference Range & Units 11/07/21 07:24 11/07/21 11:30  Glucose-Capillary 70 - 99 mg/dL 155 (H) 201 (H)    Latest Reference Range & Units 11/06/21 07:13 11/06/21 11:54 11/06/21 15:24 11/06/21 20:56  Glucose-Capillary 70 - 99 mg/dL 172 (H) 328 (H) 184 (H) 174 (H)   Review of Glycemic Control  Current orders for Inpatient glycemic control: Semglee 5 units daily, Semglee 25 units QHS, Novolog 10 units TID with meals, Novolog 0-20 units TID with meals   Inpatient Diabetes Program Recommendations:     Insulin: Please consider increasing meal coverage to Novolog 12 units TID with meals.   Thanks, Barnie Alderman, RN, MSN, [place] Diabetes Coordinator Inpatient Diabetes Program [phone] (Team Pager from 8am to [place])

## 2021-11-08 DIAGNOSIS — R410 Disorientation, unspecified: Secondary | ICD-10-CM | POA: Diagnosis not present

## 2021-11-08 LAB — PHOSPHORUS: Phosphorus: 4 mg/dL (ref 2.5–4.6)

## 2021-11-08 LAB — GLUCOSE, CAPILLARY
Glucose-Capillary: 158 mg/dL — ABNORMAL HIGH (ref 70–99)
Glucose-Capillary: 194 mg/dL — ABNORMAL HIGH (ref 70–99)
Glucose-Capillary: 150 mg/dL — ABNORMAL HIGH (ref 70–99)
Glucose-Capillary: 152 mg/dL — ABNORMAL HIGH (ref 70–99)

## 2021-11-08 LAB — MAGNESIUM: Magnesium: 2 mg/dL (ref 1.7–2.4)

## 2021-11-08 MED ORDER — DILTIAZEM HCL ER 60 MG PO CP12
120.0000 mg | ORAL_CAPSULE | Freq: Two times a day (BID) | ORAL | Status: DC
  Administered 2021-11-08 – 2021-11-13 (×10): 120 mg via ORAL
  Filled 2021-11-08 (×10): qty 2

## 2021-11-08 NOTE — Progress Notes (Signed)
Spoke with Brie, primary RN for this patient regarding PICC line removal. RN stated she will remove PICC, and is comfortable with line removal

## 2021-11-08 NOTE — Progress Notes (Signed)
Occupational Therapy Treatment Patient Details Name: [name]. MRN: [id number] DOB: 03-08-1956 Today's Date: 11/08/2021   History of present illness [name] is a 79yoM who comes to [facility] on 09/28/21 c progression of Left chornic heel ulcer. MRI showed significant progressive osteomyelitis in the left calcaneus. S/p L BKA 6/6. PT signed off 10/24/21 due to decline in respiratory status and transition to higher level of care due to increased demands for supplemental oxygen and high risk for deterioration and intubation.PMH HTN, IDDM2, morbid obesity, depression, CAD s/p CABG 2006 and PCI to RCA in 12/2020, chronic B venous stasis on Unna boots, chronic L foot ulcer, COPD, OSA on CPAP nightly, ai-fib, PAD, s/p multiple toe amputations.   OT comments  Pt greeted in bed, agreeable to OT tx session. Pt is alert and oriented x4. Tx session targeted improve pt activity tolerance for improved ADL completion. Improvements noted in bed mobility for supine>sit on this date requiring MIN A, MAX A required for sit>supine. Pt attempted STS with RW requiring MAX A +2. Step by step verbal cues for body mechanics for safe and appropriate stand. Grooming tasks completed in sitting with SETUP. Pt is left as received, NAD, all needs met. OT will follow acutely.    Recommendations for follow up therapy are one component of a multi-disciplinary discharge planning process, led by the attending physician.  Recommendations may be updated based on patient status, additional functional criteria and insurance authorization.    Follow Up Recommendations  Acute inpatient rehab (3hours/day)    Assistance Recommended at Discharge Frequent or constant Supervision/Assistance  Patient can return home with the following  A lot of help with bathing/dressing/bathroom;Assistance with cooking/housework;Help with stairs or ramp for entrance;Assist for transportation;Two people to help with walking and/or transfers   Equipment  Recommendations  Other (comment) (per next venue)    Recommendations for Other Services      Precautions / Restrictions Precautions Precautions: Fall Precaution Comments: contact ISO Restrictions Weight Bearing Restrictions: Yes RLE Weight Bearing: Weight bearing as tolerated LLE Weight Bearing: Non weight bearing Other Position/Activity Restrictions: Lt BKA 10/03/21       Mobility Bed Mobility Overal bed mobility: Needs Assistance Bed Mobility: Supine to Sit, Sit to Supine     Supine to sit: Min assist, HOB elevated Sit to supine: Max assist, HOB elevated        Transfers Overall transfer level: Needs assistance Equipment used:  (bari walker) Transfers: Sit to/from Stand Sit to Stand: Max assist, +2 physical assistance (pt unable to acheive full upright standing)                 Balance Overall balance assessment: Needs assistance Sitting-balance support: No upper extremity supported, Feet unsupported, Feet supported Sitting balance-Leahy Scale: Fair Sitting balance - Comments: fair dyanmic sitting balance Postural control: Posterior lean Standing balance support: Bilateral upper extremity supported, Reliant on assistive device for balance Standing balance-Leahy Scale: Zero                             ADL either performed or assessed with clinical judgement   ADL Overall ADL's : Needs assistance/impaired     Grooming: Sitting;Set up Grooming Details (indicate cue type and reason): blow his nose                                    Extremity/Trunk  Assessment              Vision       Perception     Praxis      Cognition Arousal/Alertness: Awake/alert Behavior During Therapy: WFL for tasks assessed/performed Overall Cognitive Status: Within Functional Limits for tasks assessed Area of Impairment: Problem solving, Following commands, Safety/judgement                   Current Attention Level: Sustained    Following Commands: Follows one step commands consistently, Follows one step commands with increased time Safety/Judgement: Decreased awareness of safety, Decreased awareness of deficits Awareness: Anticipatory Problem Solving: Requires verbal cues, Requires tactile cues          Exercises Other Exercises Other Exercises: edu re: HEP with red theraband- shoulder and BUE strengthening, written HEP to be given as appropriate    Shoulder Instructions       General Comments spo2 down to 88% on RA (pt reporting wanting to blow his nose, [place] off when this therapist entered the room); SPo2 >90% on 2L via [place] throughout; HR up to 118    Pertinent Vitals/ Pain       Pain Assessment Pain Assessment: No/denies pain  Home Living                                          Prior Functioning/Environment              Frequency           Progress Toward Goals  OT Goals(current goals can now be found in the care plan section)  Progress towards OT goals: Progressing toward goals     Plan Discharge plan remains appropriate    Co-evaluation                 AM-PAC OT "6 Clicks" Daily Activity     Outcome Measure   Help from another person eating meals?: A Little Help from another person taking care of personal grooming?: None Help from another person toileting, which includes using toliet, bedpan, or urinal?: Total Help from another person bathing (including washing, rinsing, drying)?: A Lot Help from another person to put on and taking off regular upper body clothing?: A Little Help from another person to put on and taking off regular lower body clothing?: A Lot 6 Click Score: 15    End of Session Equipment Utilized During Treatment: Oxygen;Gait belt;Other (comment) (bari walker)  OT Visit Diagnosis: Unsteadiness on feet (R26.81);Repeated falls (R29.6);Muscle weakness (generalized) (M62.81)   Activity Tolerance Patient tolerated treatment well    Patient Left in bed;with call bell/phone within reach   Nurse Communication Mobility status        Time: 8250-0370 OT Time Calculation (min): 20 min  Charges: OT General Charges $OT Visit: 1 Visit OT Treatments $Therapeutic Activity: 8-22 mins  Shanon Payor, OTD OTR/L  11/08/21, 4:21 PM

## 2021-11-08 NOTE — Progress Notes (Signed)
1300 Pt HR in 100's hospitalist already aware.

## 2021-11-08 NOTE — Progress Notes (Signed)
Physical Therapy Treatment Patient Details Name: [name]. MRN: [id number] DOB: 1955-06-26 Today's Date: 11/08/2021   History of Present Illness [name] is a 73yoM who comes to [facility] on 09/28/21 c progression of Left chornic heel ulcer. MRI showed significant progressive osteomyelitis in the left calcaneus. S/p L BKA 6/6. PT signed off 10/24/21 due to decline in respiratory status and transition to higher level of care due to increased demands for supplemental oxygen and high risk for deterioration and intubation.PMH HTN, IDDM2, morbid obesity, depression, CAD s/p CABG 2006 and PCI to RCA in 12/2020, chronic B venous stasis on Unna boots, chronic L foot ulcer, COPD, OSA on CPAP nightly, ai-fib, PAD, s/p multiple toe amputations.    PT Comments    Pt was A and O x 4 today and much more aware of situation. He is aware that he will require extensive PT going forward but is also demonstrating improvements daily. [name] was able to exit L side of bed with max assist of one. Sat EOB with supervision prior to lateral scoot from EOB into recliner(towards R) less overall assistance required however transfer takes A LOT of increased time. He tolerated sitting in recliner x ~ 4 hours prior to hoyer transfer back to bed. Author encouraged pt attempt standing however pt did not feel he was ready at this time. Will attempt standing next session with +2 assistance.Highly recommend rehab at DC to maximize independence while decreasing caregiver burden.      Recommendations for follow up therapy are one component of a multi-disciplinary discharge planning process, led by the attending physician.  Recommendations may be updated based on patient status, additional functional criteria and insurance authorization.  Follow Up Recommendations  Acute inpatient rehab (3hours/day) Can patient physically be transported by private vehicle: No   Assistance Recommended at Discharge Frequent or constant  Supervision/Assistance  Patient can return home with the following Two people to help with bathing/dressing/bathroom;Two people to help with walking and/or transfers;Assistance with cooking/housework;Assist for transportation;Help with stairs or ramp for entrance;Direct supervision/assist for medications management;Direct supervision/assist for financial management   Equipment Recommendations  None recommended by PT       Precautions / Restrictions Precautions Precautions: Fall Precaution Comments: contact ISO Restrictions Weight Bearing Restrictions: Yes RLE Weight Bearing: Weight bearing as tolerated LLE Weight Bearing: Non weight bearing Other Position/Activity Restrictions: Lt BKA 10/03/21     Mobility  Bed Mobility Overal bed mobility: Needs Assistance Bed Mobility: Rolling, Sidelying to Sit, Supine to Sit, Sit to Supine Rolling: Mod assist Sidelying to sit: Max assist Supine to sit: Max assist Sit to supine: Max assist   General bed mobility comments: Pt was able to roll R to short sit with max assist. increased time required with vcs throughout for sequencing.    Transfers Overall transfer level: Needs assistance Equipment used: None Transfers: Bed to chair/wheelchair/BSC    Lateral/Scoot Transfers: From elevated surface, Mod assist, Max assist General transfer comment: Pt was able to lateral scoot from EOB to recliner with mod-max assist fo one,. increased time required (~ 15 minutes to complete transfer and reposition in recliner) Transfer via Lift Equipment:  (hoyer lift)  Ambulation/Gait  General Gait Details: unable/unsafe   Balance Overall balance assessment: Needs assistance Sitting-balance support: No upper extremity supported, Feet unsupported, Feet supported Sitting balance-Leahy Scale: Fair Sitting balance - Comments: CGA mostly however supervision at times. occasional LOB posteriorly Postural control: Posterior lean Standing balance support: Bilateral  upper extremity supported (+ RLE support)  Standing balance-Leahy Scale: Zero Standing balance comment: unable/unsafe to stand currently         Cognition Arousal/Alertness: Awake/alert Behavior During Therapy: WFL for tasks assessed/performed Overall Cognitive Status: Within Functional Limits for tasks assessed Area of Impairment: Safety/judgement, Awareness, Problem solving      Orientation Level: Disoriented to, Situation Current Attention Level: Sustained   Following Commands: Follows one step commands consistently, Follows one step commands with increased time Safety/Judgement: Decreased awareness of safety     General Comments: Pt is A and O x 4. Agreeable to session and motivated to get OOB.        Exercises General Exercises - Lower Extremity Long Arc Quad: AROM, Both, 20 reps    General Comments General comments (skin integrity, edema, etc.): pt perform alot of exercises while sitting EOB prior to transferring OOB to recliner        PT Goals (current goals can now be found in the care plan section) Acute Rehab PT Goals Patient Stated Goal: to get better Progress towards PT goals: Progressing toward goals    Frequency    7X/week      PT Plan Current plan remains appropriate    Co-evaluation     PT goals addressed during session: Mobility/safety with mobility;Proper use of DME;Balance;Strengthening/ROM        AM-PAC PT "6 Clicks" Mobility   Outcome Measure  Help needed turning from your back to your side while in a flat bed without using bedrails?: A Lot Help needed moving from lying on your back to sitting on the side of a flat bed without using bedrails?: A Lot Help needed moving to and from a bed to a chair (including a wheelchair)?: A Lot Help needed standing up from a chair using your arms (e.g., wheelchair or bedside chair)?: Total Help needed to walk in hospital room?: Total Help needed climbing 3-5 steps with a railing? : Total 6 Click  Score: 9    End of Session Equipment Utilized During Treatment: Gait belt;Oxygen Activity Tolerance: Patient tolerated treatment well Patient left: in bed;with call bell/phone within reach;with bed alarm set Nurse Communication: Mobility status PT Visit Diagnosis: Difficulty in walking, not elsewhere classified (R26.2);Other abnormalities of gait and mobility (R26.89);Muscle weakness (generalized) (M62.81)     Time: 8676-7209 PT Time Calculation (min) (ACUTE ONLY): 41 min  Charges:  $Therapeutic Exercise: 8-22 mins $Therapeutic Activity: 23-37 mins                     Julaine Fusi PTA 11/08/21, 2:14 PM

## 2021-11-08 NOTE — Progress Notes (Signed)
[facility] Cardiology  SUBJECTIVE:  - weaned to 3L by [place] - was in chair most of the day, worked with PT this afternoon, conversant and looks like himself. Eager to leave the hospital pending LTAC approval  - no complaiints   Vitals:   11/07/21 2235 11/08/21 0334 11/08/21 0754 11/08/21 1232  BP:  (!) 130/91 134/88 139/87  Pulse:  (!) 110 (!) 118 (!) 121  Resp:  '15 18 19  '$ Temp:  98.2 F (36.8 C) 97.8 F (36.6 C) 98.5 F (36.9 C)  TempSrc:    Oral  SpO2: 97% 96% 99% 96%  Weight:      Height:         Intake/Output Summary (Last 24 hours) at 11/08/2021 1319 Last data filed at 11/08/2021 1017 Gross per 24 hour  Intake 480 ml  Output 750 ml  Net -270 ml     PHYSICAL EXAM  General: Awake and alert, laying in bed after being in recliner most of the morning. Conversant today HEENT:  Normocephalic and atramatic Neck:  No JVD.  Lungs: Clear bilaterally to auscultation without appreciable crackles or wheezes Heart: Tachycardic but regular . Normal S1 and S2 without gallops or murmurs.  Abdomen: morbidly obese appearing Msk:  Woody induration of RLE, S/p L BKA well healed incision on stump Extremities: moves all extremities spontaneously, Left BKA Neuro: No focal deficits.  Psych:  good insight into his current hospital stay, mentation much improved.    LABS: Basic Metabolic Panel: Recent Labs    11/06/21 0415 11/07/21 0352 11/08/21 0512  NA 138 140  --   K 4.6 4.2  --   CL 101 103  --   CO2 31 29  --   GLUCOSE 192* 144*  --   BUN 25* 22  --   CREATININE 0.68 0.70  --   CALCIUM 8.4* 8.6*  --   MG 2.0 2.1 2.0  PHOS 3.4 3.8 4.0    Liver Function Tests: No results for input(s): "AST", "ALT", "ALKPHOS", "BILITOT", "PROT", "ALBUMIN" in the last 72 hours.  No results for input(s): "LIPASE", "AMYLASE" in the last 72 hours. CBC: Recent Labs    11/07/21 0352  WBC 6.9  HGB 9.7*  HCT 32.1*  MCV 92.2  PLT 300    Cardiac Enzymes: No results for input(s): "CKTOTAL",  "CKMB", "CKMBINDEX", "TROPONINI" in the last 72 hours. BNP: Invalid input(s): "POCBNP" D-Dimer: No results for input(s): "DDIMER" in the last 72 hours. Hemoglobin A1C: No results for input(s): "HGBA1C" in the last 72 hours. Fasting Lipid Panel: No results for input(s): "CHOL", "HDL", "LDLCALC", "TRIG", "CHOLHDL", "LDLDIRECT" in the last 72 hours. Thyroid Function Tests: No results for input(s): "TSH", "T4TOTAL", "T3FREE", "THYROIDAB" in the last 72 hours.  Invalid input(s): "FREET3"  Anemia Panel: No results for input(s): "VITAMINB12", "FOLATE", "FERRITIN", "TIBC", "IRON", "RETICCTPCT" in the last 72 hours.   No results found.   Echo LVEF 55-60% 10/05/2021  TELEMETRY: Atrial flutter 116 bpm:  ASSESSMENT AND PLAN:  Principal Problem:   Acute delirium Active Problems:   Essential hypertension, benign   RLS (restless legs syndrome)   Osteomyelitis (HCC)   Obesity, Class III, BMI 40-49.9 (morbid obesity) (HCC)   Acute on chronic diastolic CHF (congestive heart failure) (HCC)   AF (paroxysmal atrial fibrillation) ([place])   Uncontrolled type 2 diabetes mellitus with hypoglycemia, with long-term current use of insulin (HCC)   Malnutrition of moderate degree   Generalized weakness   Acute respiratory failure with hypoxia and hypercapnia ([place])  OSA (obstructive sleep apnea)   Sepsis due to methicillin resistant Staphylococcus aureus (MRSA) (HCC)   Acute on chronic respiratory failure with hypoxia and hypercapnia (HCC)   Acute urinary retention   Hyperkalemia   Iron deficiency anemia   UTI (urinary tract infection)   Pressure injury of skin of heel    1. Acute on chronic diastolic congestive heart failure, HFpEF, LVEF 55-60% 10/05/21, good diuresis after IV furosemide but remains on supplemental O2 2.  Persistent atrial flutter, currently in atrial flutter heart rate 116 bpm, on amiodarone for rate control and Eliquis for stroke prevention (home rate control diltiazem '180mg'$  and  metoprolol tartrate '100mg'$  BID) 3.  Coronary artery disease, status post CABG 2006, PCI RCA 01/2020, currently without chest pain 4.  Left foot osteomyelitis s/p IV linezolid, left BKA 10/03/2021 by Dr. Delana Meyer 5.  Acute kidney injury, improving to Cr 0.77 and >60  6.  MRSA bacteremia, osteomyelitis of L foot likely source, treated with IV daptomycin, repeat echo ordered by ID, resulted with thickened aortic and mitral leaflets without obvious vegetation.      Recommendations   1.  Agree with current therapy 2.  Continue Eliquis for stroke prevention 3.  Continue amiodarone 200 mg daily for rate control.  - increase diltiazem from 90 q12 to 120 q12 - Holding metoprolol, may need to resume 4.  Diuresis as needed. s/p torsemide 60 mg PO 7/7 with >> 4 L net negative.  Continue to hold. Can probably discharge on '20mg'$  torsemide daily  5.  Wean oxygen as tolerated 6.  Pending rehab vs LTAC vs CIR placement   This patient's plan of care was discussed and created with Dr. Nehemiah Massed and he is in agreement.    Tristan Schroeder, PA-C 11/08/2021 1:19 PM

## 2021-11-08 NOTE — Progress Notes (Signed)
PROGRESS NOTE    [name].  GQQ:[id number] DOB: March 25, 1956  DOA: 09/28/2021 Date of Service: 11/08/21 PCP: [name], MD     Hospital Course:  [name]. is a 66 y.o. male with medical history significant for CHF with last known LVEF of 50 to 55%, HTN, CAD, MDD, COPD, A-fib, morbid obesity (BMI 21), OSA, status post recent wound debridement and partial calcanectomy (05/16) on oral antibiotic therapy after completion of IV. He presented to the ER 09/28/2021 for bilateral lower extremity swelling, increased redness and swelling involving the left lower extremity mostly over the heel. Admitted 09/28/2021  06/1: Pt admitted to the [facility] unit with acute on chronic diastolic CHF and left heel osteomyelitis  06/2: Podiatry consulted for left foot osteomyelitis, plan for higher amputation.  Vascular surgery consulted  06/4: ID consulted for MRSA bacteremia likely source left heel wound  06/6: Pt underwent left BKA per vascular surgery  06/6 to 06/17: Pt with worsening acute on chronic hypoxic respiratory failure secondary to volume overload due to diastolic CHF.  CXR revealed new pleural effusion.  IV lasix resumed.  Requiring Bipap or [place]  06/21 to 06/27: mentation waxed and waned requiring Bipap during the day due to hypoxia  06/27: Pt requiring 100% HHFNC and transfer to the stepdown unit.  PCCM team consulted to assist with management  6/28: Pt tolerating [place] @ 50L/85%; Underwent Right-sided thoracentesis with removal of 1.2L of pleural fluid 6/29: Tolerating 45L/70% FiO2 with O2 sats 100%; remains on levophed gtt '@4'$  mcg/min to maintain map >65 6/30: altered mental status, but not from hypercapnia  7/1: Pt with intermittent agitation with worsening delirium that started 6/20.  Received prn seroquel and haldol overnight due to sleep deprivation.  This am pt calm with improvement in mentation  7/2: Pt lethargic after receiving prn haldol x1 dose and seroquel x1  dose.  Tolerating Bipap '@30'$ % FiO2.  If pt remains stable throughout the day will transfer service to [facility] starting 07/3 and PCCM team will sign off  7/3-7/4: supplemental oxygen and currently on bubbler at 10L. Can be transferred to PCU. Mental status has much improved today. PT/OT will likely need to come re-evaluate but will likely need LTAC vs SNF. CM is working on this. 7/5: still requiring 12L O2 this AM but --> 5L later in the day. 7/6: down to 4L [place] in the day, still on BiPap qhs/when asleep. PT/OT recommending SNF/LTAC, TOC working on LTAC but given O2 requirement have improved he may be able to go to SNF, TOC will look into this. Increased beta blocker given tachycardia. Cardiology also following.   7/7: potassium has been slightly elevated, worsening today. Restarted torsemide. D/w pharmacy re: risk QT prolongation w/ BB, amiodarone, antipsychotics - appreciate cardiology input: ok to d/c midodrine, restart diltiazem and d/c metoprolol, restart loop diuretic 7/8-7/9: . K is improved/down to WNL. Cr WNL, BUN trended up. Concern he is volume contracted, cardio d/c torsemide 7/8. Cardio recs: amiodarone 200 gm daily, diltiazem 90 q12 may need to increase, metoprolol held (was increased d/t HR, concern for high K and since holding this K has improved), diuresis as needed w/ torsemide which is held for now. HR remains elevated but otherwise he is approaching medical stability and TOC is working on placement.  7/10: Brief episode of confusion/hallucination which resolved spontaneously (patient states he was seeing finishes either outside the window or at his bedside table), ABG normal, UA obtained no UTI, remove Foley today  hopefully can leave this out if urinary retention has resolved 7/11: am labs no concerns, awaiting placement, still tachycardic will leave tele on but transfer to med surg  7/12: Tachycardic, asymptomatic.  Patient states his heart rate is "always in the 100s" discontinued telemetry  and PICC line still awaiting placement    Consultants:  Podiatry Vascular Surgery Infectious Disease Cardiology Palliative Care   Procedures: 10/03/21: left BKA per vascular surgery    Subjective: Patient is in good spirits today, out of bed to chair, and during the day from the window.  Denies pain, chest pain, shortness of breath.     ASSESSMENT & PLAN:   Principal Problem:   Acute delirium Active Problems:   Sepsis due to methicillin resistant Staphylococcus aureus (MRSA) ([place])   Acute on chronic respiratory failure with hypoxia and hypercapnia (HCC)   UTI (urinary tract infection)   Acute on chronic diastolic CHF (congestive heart failure) ([employer])   Uncontrolled type 2 diabetes mellitus with hypoglycemia, with long-term current use of insulin (HCC)   AF (paroxysmal atrial fibrillation) (HCC)   Obesity, Class III, BMI 40-49.9 (morbid obesity) ([place])   Essential hypertension, benign   Osteomyelitis (HCC)   RLS (restless legs syndrome)   Acute urinary retention   Malnutrition of moderate degree   Generalized weakness   Acute respiratory failure with hypoxia and hypercapnia (HCC)   OSA (obstructive sleep apnea)   Hyperkalemia   Iron deficiency anemia   Pressure injury of skin of heel  Sepsis POA, resolved: due to methicillin resistant Staphylococcus aureus. RESOLVED  Blood cxs (+)MRSA. Repeat blood cx NGTD, Echo no vegetations on TTE, unable to to TEE. Likely secondary to osteomyelitis. Continue on linezolid (was on daptomycin but concern for eosinophilia so plan for linezolid until 11/01/21. Poor prognosis. High risk for intubation & cardiac arrest. See infectious disease notes 07/03: ID signed off    Acute hypoxic/hypercapnic  resp failure -secondary to combination of CHF, hypoventilation syndrome, OSA , CO2 retention. IMPROVED/STABLE   Ruled out amiodarone induced pneumonitis and dapto induced eosinophilic pneumonia.  BiPAP qhs / while asleep  Has been doing well on  nasal cannula during the day   Acute on chronic diastolic CHF: EF 55 to 64% 04/2021 - RESOLVED/STABLE  Cardiology following. Has been relatively well-compensated past few days.  Cardio recs: amiodarone 200 gm daily, diltiazem 90 q12 may need to increase, metoprolol held (was increased d/t HR, concern for high K and since holding this K has improved), diuresis as needed w/ torsemide   Tachycardia and occasional elevated RR = SIRS, sepsis r/o HR remains elevated  Telemetry discontinued today  Hyperkalemia - IMPROVED/RESOLVED potassium has been slightly elevated Stopped beta-blocker due to potential this may be contributing to hyperkalemia, started diltiazem, added back torsemide.  Trend BMP in a.m. --> improved, torsemide d/c 7/8  PAF:  HTN:  continue on eliquis, amio, diltiazem.   Restarted metoprolol given tachycardia and BP has been better however discontinued this due to concerns for mild but worsening hyperkalemia.  Placed on diltiazem. Cardio following.  Ideally would increase diltiazem but BP has been low Previously was on pressors but since have been weaned off  UTI - RESOLVED:  urine cx (+)citrobacter. Completed abx course 3 days cefepime.     Acute delirium -RESOLVED:  labile mental status. Continue on risperidone. Seroquel qhs prn     DM2:  poorly controlled, HbA1c 10.2. Continue on glargine, aspart, SSI w/ accuchecks   Morbid obesity:  BMI 41.4. Complicates overall care &  prognosis    Osteomyelitis of LLE: s/p L BLKA.  Completed linezolid 11/01/21 as per ID    RLS: holding home dose of mirapex   Acute urinary retention: foley catheter placed on 10/08/2021.  Foley DC'd on 6/16.  Foley needed to be replaced on 10/24/2021   IDA: H&H had been labile. No need for a tranfusion    OSA: CPAP qhs. Qualifies for NIV with elevated PCO2 on ABG.    Generalized weakness: PT/OT will have to re-evaluate pt   Malnutrition of moderate degree: related to acute illness (left heel  osteomyelitis) and energy intake of Wood than 75% for more than 7 days, mild fat depletion.  Continue on nutritional supplements           DVT prophylaxis: Eliquis Code Status: FULL Family Communication : We will call wife with any pertinent updates Disposition Plan / TOC needs: likely will need d/c to SNF/LTAC, declined inpatient rehab, TOC arranging placement. Barriers to discharge / significant pending items: requiring high supplemental O2, may need LTAC vs SNF              Objective: Vitals:   11/07/21 2235 11/08/21 0334 11/08/21 0754 11/08/21 1232  BP:  (!) 130/91 134/88 139/87  Pulse:  (!) 110 (!) 118 (!) 121  Resp:  '15 18 19  '$ Temp:  98.2 F (36.8 C) 97.8 F (36.6 C) 98.5 F (36.9 C)  TempSrc:    Oral  SpO2: 97% 96% 99% 96%  Weight:      Height:        Intake/Output Summary (Last 24 hours) at 11/08/2021 1307 Last data filed at 11/08/2021 1017 Gross per 24 hour  Intake 480 ml  Output 750 ml  Net -270 ml    Filed Weights   10/28/21 0500 11/03/21 0350 11/07/21 0451  Weight: (!) 154.4 kg (!) 167.8 kg (!) 173.7 kg    Examination:  Constitutional:  VS as above General Appearance: alert, well-developed, well-nourished, NAD Eyes: Normal lids and conjunctive, non-icteric sclera Neck: No masses, trachea midline Respiratory: Normal respiratory effort Breath sounds normal, no wheeze/rhonchi/rales Cardiovascular: S1/S2 normal Trace lower extremity edema RLE Gastrointestinal: Nontender, no masses but habitus limits exam.  Musculoskeletal:  No clubbing/cyanosis of digits Left BKA Neurological: No cranial nerve deficit on limited exam Psychiatric: Normal judgment/insight Depressed/flat mood and affect       Scheduled Medications:   amiodarone  200 mg Oral Daily   apixaban  5 mg Oral BID   vitamin C  500 mg Oral Daily   aspirin EC  81 mg Oral Daily   budesonide (PULMICORT) nebulizer solution  0.5 mg Nebulization BID   chlorhexidine  15 mL  Mouth Rinse BID   Chlorhexidine Gluconate Cloth  6 each Topical Daily   cholecalciferol  1,000 Units Oral Daily   diltiazem  90 mg Oral Q12H   ezetimibe  10 mg Oral Daily   ferrous sulfate  325 mg Oral Q breakfast   finasteride  5 mg Oral Daily   insulin aspart  0-20 Units Subcutaneous TID WC   insulin aspart  12 Units Subcutaneous TID WC   insulin glargine-yfgn  25 Units Subcutaneous QHS   insulin glargine-yfgn  5 Units Subcutaneous Daily   ipratropium-albuterol  3 mL Nebulization BID   levothyroxine  25 mcg Oral Q0600   multivitamin with minerals  1 tablet Oral Daily   nystatin   Topical BID   primidone  250 mg Oral BID   Ensure Max Protein  11 oz Oral BID   risperiDONE  0.5 mg Oral BID   sodium chloride flush  10-40 mL Intracatheter Q12H   vitamin B-12  1,000 mcg Oral Daily    Continuous Infusions:  sodium chloride Stopped (10/25/21 2209)    PRN Medications:  acetaminophen **OR** acetaminophen, albuterol, haloperidol lactate, metoprolol tartrate, nitroGLYCERIN, ondansetron **OR** ondansetron (ZOFRAN) IV, mouth rinse, QUEtiapine, sodium chloride flush  Antimicrobials:  Anti-infectives (From admission, onward)    Start     Dose/Rate Route Frequency Ordered Stop   10/26/21 2200  linezolid (ZYVOX) tablet 600 mg        600 mg Oral Every 12 hours 10/26/21 1743 11/01/21 2139   10/24/21 2200  linezolid (ZYVOX) IVPB 600 mg  Status:  Discontinued        600 mg 300 mL/hr over 60 Minutes Intravenous Every 12 hours 10/24/21 2157 10/26/21 1743   10/24/21 1800  ceFEPIme (MAXIPIME) 2 g in sodium chloride 0.9 % 100 mL IVPB        2 g 200 mL/hr over 30 Minutes Intravenous Every 8 hours 10/24/21 1612 10/26/21 1730   10/24/21 1030  sulfamethoxazole-trimethoprim (BACTRIM DS) 800-160 MG per tablet 1 tablet  Status:  Discontinued        1 tablet Oral Every 12 hours 10/24/21 0944 10/24/21 1608   10/23/21 2000  DAPTOmycin (CUBICIN) 800 mg in sodium chloride 0.9 % IVPB  Status:  Discontinued         6 mg/kg  129.2 kg (Adjusted) 132 mL/hr over 30 Minutes Intravenous Every 24 hours 10/23/21 0745 10/24/21 1803   10/22/21 0945  cefTRIAXone (ROCEPHIN) 1 g in sodium chloride 0.9 % 100 mL IVPB        1 g 200 mL/hr over 30 Minutes Intravenous Every 24 hours 10/22/21 0848 10/24/21 1845   10/04/21 1000  DAPTOmycin (CUBICIN) 800 mg in sodium chloride 0.9 % IVPB  Status:  Discontinued        6 mg/kg  129.2 kg (Adjusted) 132 mL/hr over 30 Minutes Intravenous Every 24 hours 10/03/21 1648 10/23/21 0745   10/03/21 0000  vancomycin (VANCOREADY) IVPB 1500 mg/300 mL        1,500 mg 150 mL/hr over 120 Minutes Intravenous Every 12 hours 10/02/21 1348 10/03/21 2359   09/30/21 1330  cefTRIAXone (ROCEPHIN) 2 g in sodium chloride 0.9 % 100 mL IVPB  Status:  Discontinued        2 g 200 mL/hr over 30 Minutes Intravenous Daily 09/30/21 1233 10/03/21 1849   09/30/21 1200  vancomycin (VANCOREADY) IVPB 1500 mg/300 mL  Status:  Discontinued        1,500 mg 150 mL/hr over 120 Minutes Intravenous Every 12 hours 09/30/21 0120 09/30/21 0927   09/30/21 1200  vancomycin (VANCOREADY) IVPB 1250 mg/250 mL  Status:  Discontinued        1,250 mg 166.7 mL/hr over 90 Minutes Intravenous Every 12 hours 09/30/21 0927 10/02/21 1348   09/30/21 0000  vancomycin (VANCOREADY) IVPB 1250 mg/250 mL       See Hyperspace for full Linked Orders Report.   1,250 mg 166.7 mL/hr over 90 Minutes Intravenous  Once 09/29/21 2308 09/30/21 0538   09/30/21 0000  vancomycin (VANCOREADY) IVPB 1250 mg/250 mL       See Hyperspace for full Linked Orders Report.   1,250 mg 166.7 mL/hr over 90 Minutes Intravenous  Once 09/29/21 2308 09/30/21 0255   09/29/21 0200  cefTRIAXone (ROCEPHIN) 2 g in sodium chloride 0.9 % 100 mL IVPB  Status:  Discontinued       See Hyperspace for full Linked Orders Report.   2 g 200 mL/hr over 30 Minutes Intravenous Every 24 hours 09/28/21 2051 09/29/21 2309   09/29/21 0200  metroNIDAZOLE (FLAGYL) IVPB 500 mg  Status:   Discontinued       See Hyperspace for full Linked Orders Report.   500 mg 100 mL/hr over 60 Minutes Intravenous Every 12 hours 09/28/21 2051 10/03/21 1849   09/28/21 1830  vancomycin (VANCOCIN) IVPB 1000 mg/200 mL premix        1,000 mg 200 mL/hr over 60 Minutes Intravenous  Once 09/28/21 1826 09/28/21 1937   09/28/21 1745  piperacillin-tazobactam (ZOSYN) IVPB 3.375 g        3.375 g 100 mL/hr over 30 Minutes Intravenous  Once 09/28/21 1736 09/28/21 1838       Data Reviewed: I have personally reviewed following labs and imaging studies  CBC: Recent Labs  Lab 11/02/21 0400 11/03/21 0351 11/05/21 0405 11/07/21 0352  WBC 8.9 8.9 7.2 6.9  HGB 9.4* 9.5* 9.5* 9.7*  HCT 33.3* 33.0* 32.0* 32.1*  MCV 96.0 95.4 93.6 92.2  PLT 333 352 332 673    Basic Metabolic Panel: Recent Labs  Lab 11/03/21 0351 11/04/21 0455 11/05/21 0405 11/06/21 0415 11/07/21 0352 11/08/21 0512  NA 138 138 138 138 140  --   K 5.4* 4.4 4.7 4.6 4.2  --   CL 103 100 99 101 103  --   CO2 31 33* 33* 31 29  --   GLUCOSE 142* 92 168* 192* 144*  --   BUN 29* 33* 32* 25* 22  --   CREATININE 0.72 0.95 0.75 0.68 0.70  --   CALCIUM 8.4* 8.8* 8.3* 8.4* 8.6*  --   MG 1.9 1.7 1.9 2.0 2.1 2.0  PHOS 4.3 5.1* 4.1 3.4 3.8 4.0    GFR: Estimated Creatinine Clearance: 156.2 mL/min (by C-G formula based on SCr of 0.7 mg/dL). Liver Function Tests: No results for input(s): "AST", "ALT", "ALKPHOS", "BILITOT", "PROT", "ALBUMIN" in the last 168 hours.  No results for input(s): "LIPASE", "AMYLASE" in the last 168 hours. No results for input(s): "AMMONIA" in the last 168 hours. Coagulation Profile: No results for input(s): "INR", "PROTIME" in the last 168 hours. Cardiac Enzymes: No results for input(s): "CKTOTAL", "CKMB", "CKMBINDEX", "TROPONINI" in the last 168 hours. BNP (last 3 results) No results for input(s): "PROBNP" in the last 8760 hours. HbA1C: No results for input(s): "HGBA1C" in the last 72  hours. CBG: Recent Labs  Lab 11/07/21 1130 11/07/21 1445 11/07/21 2014 11/08/21 0750 11/08/21 1230  GLUCAP 201* 153* 144* 158* 194*    Lipid Profile: No results for input(s): "CHOL", "HDL", "LDLCALC", "TRIG", "CHOLHDL", "LDLDIRECT" in the last 72 hours. Thyroid Function Tests: No results for input(s): "TSH", "T4TOTAL", "FREET4", "T3FREE", "THYROIDAB" in the last 72 hours. Anemia Panel: No results for input(s): "VITAMINB12", "FOLATE", "FERRITIN", "TIBC", "IRON", "RETICCTPCT" in the last 72 hours. Urine analysis:    Component Value Date/Time   COLORURINE STRAW (A) 11/06/2021 1900   APPEARANCEUR CLEAR (A) 11/06/2021 1900   LABSPEC 1.008 11/06/2021 1900   PHURINE 7.0 11/06/2021 1900   GLUCOSEU NEGATIVE 11/06/2021 1900   HGBUR NEGATIVE 11/06/2021 1900   BILIRUBINUR NEGATIVE 11/06/2021 1900   KETONESUR NEGATIVE 11/06/2021 1900   PROTEINUR 100 (A) 11/06/2021 1900   NITRITE NEGATIVE 11/06/2021 1900   LEUKOCYTESUR LARGE (A) 11/06/2021 1900   Sepsis Labs: '@LABRCNTIP'$ (procalcitonin:4,lacticidven:4)  No results found for this or any  previous visit (from the past 240 hour(s)).        Radiology Studies last 96 hours: No results found.          LOS: 41 days       Emeterio Reeve, DO [facility] 11/08/2021, 1:07 PM   Staff may message me via secure chat in [place]  but this may not receive immediate response,  please page for urgent matters!  If 7PM-7AM, please contact night-coverage [email or web address]  Dictation software was used to generate the above note. Typos may occur and escape review, as with typed/written notes. Please contact Dr Sheppard Coil directly for clarity if needed.

## 2021-11-09 DIAGNOSIS — R410 Disorientation, unspecified: Secondary | ICD-10-CM | POA: Diagnosis not present

## 2021-11-09 LAB — BASIC METABOLIC PANEL
Anion gap: 5 (ref 5–15)
BUN: 23 mg/dL (ref 8–23)
CO2: 30 mmol/L (ref 22–32)
Calcium: 8.8 mg/dL — ABNORMAL LOW (ref 8.9–10.3)
Chloride: 103 mmol/L (ref 98–111)
Creatinine, Ser: 0.71 mg/dL (ref 0.61–1.24)
GFR, Estimated: >60 mL/min (ref >60)
Glucose, Bld: 124 mg/dL — ABNORMAL HIGH (ref 70–99)
Potassium: 4.7 mmol/L (ref 3.5–5.1)
Sodium: 138 mmol/L (ref 135–145)

## 2021-11-09 LAB — GLUCOSE, CAPILLARY
Glucose-Capillary: 139 mg/dL — ABNORMAL HIGH (ref 70–99)
Glucose-Capillary: 240 mg/dL — ABNORMAL HIGH (ref 70–99)
Glucose-Capillary: 243 mg/dL — ABNORMAL HIGH (ref 70–99)
Glucose-Capillary: 148 mg/dL — ABNORMAL HIGH (ref 70–99)

## 2021-11-09 NOTE — Progress Notes (Signed)
PROGRESS NOTE    [name].  UXN:[id number] DOB: 1955/09/01  DOA: 09/28/2021 Date of Service: 11/09/21 PCP: Alec Crouch, MD     Hospital Course:  Alec Less. is a 66 y.o. male with medical history significant for CHF with last known LVEF of 50 to 55%, HTN, CAD, MDD, COPD, A-fib, morbid obesity (BMI 74), OSA, status post recent wound debridement and partial calcanectomy (05/16) on oral antibiotic therapy after completion of IV. He presented to the ER 09/28/2021 for bilateral lower extremity swelling, increased redness and swelling involving the left lower extremity mostly over the heel. Admitted 09/28/2021  06/1: Pt admitted to the [facility] unit with acute on chronic diastolic CHF and left heel osteomyelitis  06/2: Podiatry consulted for left foot osteomyelitis, plan for higher amputation.  Vascular surgery consulted  06/4: ID consulted for MRSA bacteremia likely source left heel wound  06/6: Pt underwent left BKA per vascular surgery  06/6 to 06/17: Pt with worsening acute on chronic hypoxic respiratory failure secondary to volume overload due to diastolic CHF.  CXR revealed new pleural effusion.  IV lasix resumed.  Requiring Bipap or [place]  06/21 to 06/27: mentation waxed and waned requiring Bipap during the day due to hypoxia  06/27: Pt requiring 100% HHFNC and transfer to the stepdown unit.  PCCM team consulted to assist with management  6/28: Pt tolerating [place] @ 50L/85%; Underwent Right-sided thoracentesis with removal of 1.2L of pleural fluid 6/29: Tolerating 45L/70% FiO2 with O2 sats 100%; remains on levophed gtt '@4'$  mcg/min to maintain map >65 6/30: altered mental status, but not from hypercapnia  7/1: Pt with intermittent agitation with worsening delirium that started 6/20.  Received prn seroquel and haldol overnight due to sleep deprivation.  This am pt calm with improvement in mentation  7/2: Pt lethargic after receiving prn haldol x1 dose and seroquel x1  dose.  Tolerating Bipap '@30'$ % FiO2.  If pt remains stable throughout the day will transfer service to [facility] starting 07/3 and PCCM team will sign off  7/3-7/4: supplemental oxygen and currently on bubbler at 10L. Can be transferred to PCU. Mental status has much improved today. PT/OT will likely need to come re-evaluate but will likely need LTAC vs SNF. CM is working on this. 7/5: still requiring 12L O2 this AM but --> 5L later in the day. 7/6: down to 4L [place] in the day, still on BiPap qhs/when asleep. PT/OT recommending SNF/LTAC, TOC working on LTAC but given O2 requirement have improved he may be able to go to SNF, TOC will look into this. Increased beta blocker given tachycardia. Cardiology also following.   7/7: potassium has been slightly elevated, worsening today. Restarted torsemide. D/w pharmacy re: risk QT prolongation w/ BB, amiodarone, antipsychotics - appreciate cardiology input: ok to d/c midodrine, restart diltiazem and d/c metoprolol, restart loop diuretic 7/8-7/9: . K is improved/down to WNL. Cr WNL, BUN trended up. Concern he is volume contracted, cardio d/c torsemide 7/8. Cardio recs: amiodarone 200 gm daily, diltiazem 90 q12 may need to increase, metoprolol held (was increased d/t HR, concern for high K and since holding this K has improved), diuresis as needed w/ torsemide which is held for now. HR remains elevated but otherwise he is approaching medical stability and TOC is working on placement.  7/10: Brief episode of confusion/hallucination which resolved spontaneously (patient states he was seeing finishes either outside the window or at his bedside table), ABG normal, UA obtained no UTI, remove Foley today  hopefully can leave this out if urinary retention has resolved 7/11: am labs no concerns, awaiting placement, still tachycardic will leave tele on but transfer to med surg  7/12: Tachycardic, asymptomatic.  Patient states his heart rate is "always in the 100s" discontinued telemetry  and PICC line. We are still awaiting placement at LTAC/SNF. Diltiazem was increased by cardiology, diuresis as needed and can likely d/c on torsemide 20 mg daily.  7/13: cardio has s/o, tele dc    Consultants:  Podiatry Vascular Surgery Infectious Disease Cardiology Palliative Care   Procedures: 10/03/21: left BKA per vascular surgery    Subjective: Pt feeling well today, reports the stump shrinker rolls down toward the knee but no other complaints, No CP/SOB     ASSESSMENT & PLAN:   Principal Problem:   Acute delirium Active Problems:   Sepsis due to methicillin resistant Staphylococcus aureus (MRSA) ([place])   Acute on chronic respiratory failure with hypoxia and hypercapnia (HCC)   UTI (urinary tract infection)   Acute on chronic diastolic CHF (congestive heart failure) ([place])   Uncontrolled type 2 diabetes mellitus with hypoglycemia, with long-term current use of insulin (HCC)   AF (paroxysmal atrial fibrillation) (HCC)   Obesity, Class III, BMI 40-49.9 (morbid obesity) ([place])   Essential hypertension, benign   Osteomyelitis (HCC)   RLS (restless legs syndrome)   Acute urinary retention   Malnutrition of moderate degree   Generalized weakness   Acute respiratory failure with hypoxia and hypercapnia (HCC)   OSA (obstructive sleep apnea)   Hyperkalemia   Iron deficiency anemia   Pressure injury of skin of heel  Sepsis POA, resolved: due to methicillin resistant Staphylococcus aureus. RESOLVED  Blood cxs (+)MRSA. Repeat blood cx NGTD, Echo no vegetations on TTE, unable to to TEE. Likely secondary to osteomyelitis. Continue on linezolid (was on daptomycin but concern for eosinophilia so plan for linezolid until 11/01/21. Poor prognosis. High risk for intubation & cardiac arrest. See infectious disease notes 07/03: ID signed off    Acute hypoxic/hypercapnic  resp failure -secondary to combination of CHF, hypoventilation syndrome, OSA , CO2 retention. IMPROVED/STABLE   Ruled  out amiodarone induced pneumonitis and dapto induced eosinophilic pneumonia.  BiPAP qhs / while asleep  Has been doing well on nasal cannula during the day   Acute on chronic diastolic CHF: EF 55 to 40% 04/2021 - RESOLVED/STABLE  Cardiology following. Has been relatively well-compensated past few days.  Cardio recs: amiodarone 200 gm daily, diltiazem 90 q12 may need to increase, metoprolol held (was increased d/t HR, concern for high K and since holding this K has improved), diuresis as needed w/ torsemide  Per cardiology can likely d/c on torsemide 20 mg daily.   Tachycardia and occasional elevated RR = SIRS, sepsis r/o HR remains elevated Diltiazem was increased by cardiology 07/12 Telemetry discontinued   Hyperkalemia - IMPROVED/RESOLVED potassium has been slightly elevated Stopped beta-blocker due to potential this may be contributing to hyperkalemia and K has improved since d/c this, started diltiazem, added back torsemide.  K has been WNL Monitor BMP every few days while inpatient   PAF:  HTN:  continue on eliquis, amio, diltiazem.   Restarted metoprolol given tachycardia and BP has been better however discontinued this due to concerns for mild but worsening hyperkalemia.  Placed on diltiazem. Cardio following.  Ideally would increase diltiazem but BP has been low Previously was on pressors but since have been weaned off  UTI - RESOLVED:  urine cx (+)citrobacter. Completed abx course  3 days cefepime.     Acute delirium -RESOLVED:  labile mental status. Continue on risperidone. Seroquel qhs prn     DM2:  poorly controlled, HbA1c 10.2. Continue on glargine, aspart, SSI w/ accuchecks   Morbid obesity:  BMI 41.4. Complicates overall care & prognosis    Osteomyelitis of LLE: s/p L BLKA.  Completed linezolid 11/01/21 as per ID    RLS: holding home dose of mirapex   Acute urinary retention: foley catheter placed on 10/08/2021.  Foley DC'd on 6/16.  Foley needed to be replaced  on 10/24/2021   IDA: H&H had been labile. No need for a tranfusion    OSA: CPAP qhs. Qualifies for NIV with elevated PCO2 on ABG.    Generalized weakness: PT/OT will have to re-evaluate pt   Malnutrition of moderate degree: related to acute illness (left heel osteomyelitis) and energy intake of less than 75% for more than 7 days, mild fat depletion.  Continue on nutritional supplements           DVT prophylaxis: Eliquis Code Status: FULL Family Communication : wife at bedside today Disposition Plan / TOC needs: likely will need d/c to SNF/LTAC, declined for inpatient rehab, TOC arranging placement. Wife refusing "that place in [place]" but I advised her that if no other beds are offered we will have to place discharge orders to wherever he has a bed offer and she can appeal the discharge  Barriers to discharge / significant pending items: requiring high supplemental O2, in need of rehab , pending placement at LTAC vs SNF              Objective: Vitals:   11/08/21 1450 11/08/21 1611 11/08/21 1945 11/09/21 0355  BP: 122/74  118/70 (!) 131/93  Pulse: (!) 118 (!) 114 (!) 109 (!) 110  Resp: '17  18 18  '$ Temp: 98.5 F (36.9 C)  (!) 97.5 F (36.4 C) 98.2 F (36.8 C)  TempSrc: Oral     SpO2: 99% 94% 90% 100%  Weight:      Height:        Intake/Output Summary (Last 24 hours) at 11/09/2021 0711 Last data filed at 11/09/2021 0432 Gross per 24 hour  Intake 480 ml  Output 1750 ml  Net -1270 ml    Filed Weights   10/28/21 0500 11/03/21 0350 11/07/21 0451  Weight: (!) 154.4 kg (!) 167.8 kg (!) 173.7 kg    Examination:  Constitutional:  VS as above General Appearance: alert, well-developed, well-nourished, NAD Eyes: Normal lids and conjunctive, non-icteric sclera Neck: No masses, trachea midline Respiratory: Normal respiratory effort Breath sounds normal, no wheeze/rhonchi/rales Cardiovascular: S1/S2 normal Trace lower extremity edema  RLE Gastrointestinal: Nontender, no masses but habitus limits exam.  Musculoskeletal:  No clubbing/cyanosis of digits Left BKA Neurological: No cranial nerve deficit on limited exam Psychiatric: Normal judgment/insight Depressed/flat mood and affect       Scheduled Medications:   amiodarone  200 mg Oral Daily   apixaban  5 mg Oral BID   vitamin C  500 mg Oral Daily   aspirin EC  81 mg Oral Daily   budesonide (PULMICORT) nebulizer solution  0.5 mg Nebulization BID   chlorhexidine  15 mL Mouth Rinse BID   Chlorhexidine Gluconate Cloth  6 each Topical Daily   cholecalciferol  1,000 Units Oral Daily   diltiazem  120 mg Oral Q12H   ezetimibe  10 mg Oral Daily   ferrous sulfate  325 mg Oral Q breakfast  finasteride  5 mg Oral Daily   insulin aspart  0-20 Units Subcutaneous TID WC   insulin aspart  12 Units Subcutaneous TID WC   insulin glargine-yfgn  25 Units Subcutaneous QHS   insulin glargine-yfgn  5 Units Subcutaneous Daily   ipratropium-albuterol  3 mL Nebulization BID   levothyroxine  25 mcg Oral Q0600   multivitamin with minerals  1 tablet Oral Daily   nystatin   Topical BID   primidone  250 mg Oral BID   Ensure Max Protein  11 oz Oral BID   risperiDONE  0.5 mg Oral BID   sodium chloride flush  10-40 mL Intracatheter Q12H   vitamin B-12  1,000 mcg Oral Daily    Continuous Infusions:  sodium chloride Stopped (10/25/21 2209)    PRN Medications:  acetaminophen **OR** acetaminophen, albuterol, haloperidol lactate, metoprolol tartrate, nitroGLYCERIN, ondansetron **OR** ondansetron (ZOFRAN) IV, mouth rinse, QUEtiapine, sodium chloride flush  Antimicrobials:  Anti-infectives (From admission, onward)    Start     Dose/Rate Route Frequency Ordered Stop   10/26/21 2200  linezolid (ZYVOX) tablet 600 mg        600 mg Oral Every 12 hours 10/26/21 1743 11/01/21 2139   10/24/21 2200  linezolid (ZYVOX) IVPB 600 mg  Status:  Discontinued        600 mg 300 mL/hr over 60  Minutes Intravenous Every 12 hours 10/24/21 2157 10/26/21 1743   10/24/21 1800  ceFEPIme (MAXIPIME) 2 g in sodium chloride 0.9 % 100 mL IVPB        2 g 200 mL/hr over 30 Minutes Intravenous Every 8 hours 10/24/21 1612 10/26/21 1730   10/24/21 1030  sulfamethoxazole-trimethoprim (BACTRIM DS) 800-160 MG per tablet 1 tablet  Status:  Discontinued        1 tablet Oral Every 12 hours 10/24/21 0944 10/24/21 1608   10/23/21 2000  DAPTOmycin (CUBICIN) 800 mg in sodium chloride 0.9 % IVPB  Status:  Discontinued        6 mg/kg  129.2 kg (Adjusted) 132 mL/hr over 30 Minutes Intravenous Every 24 hours 10/23/21 0745 10/24/21 1803   10/22/21 0945  cefTRIAXone (ROCEPHIN) 1 g in sodium chloride 0.9 % 100 mL IVPB        1 g 200 mL/hr over 30 Minutes Intravenous Every 24 hours 10/22/21 0848 10/24/21 1845   10/04/21 1000  DAPTOmycin (CUBICIN) 800 mg in sodium chloride 0.9 % IVPB  Status:  Discontinued        6 mg/kg  129.2 kg (Adjusted) 132 mL/hr over 30 Minutes Intravenous Every 24 hours 10/03/21 1648 10/23/21 0745   10/03/21 0000  vancomycin (VANCOREADY) IVPB 1500 mg/300 mL        1,500 mg 150 mL/hr over 120 Minutes Intravenous Every 12 hours 10/02/21 1348 10/03/21 2359   09/30/21 1330  cefTRIAXone (ROCEPHIN) 2 g in sodium chloride 0.9 % 100 mL IVPB  Status:  Discontinued        2 g 200 mL/hr over 30 Minutes Intravenous Daily 09/30/21 1233 10/03/21 1849   09/30/21 1200  vancomycin (VANCOREADY) IVPB 1500 mg/300 mL  Status:  Discontinued        1,500 mg 150 mL/hr over 120 Minutes Intravenous Every 12 hours 09/30/21 0120 09/30/21 0927   09/30/21 1200  vancomycin (VANCOREADY) IVPB 1250 mg/250 mL  Status:  Discontinued        1,250 mg 166.7 mL/hr over 90 Minutes Intravenous Every 12 hours 09/30/21 0927 10/02/21 1348   09/30/21 0000  vancomycin (  VANCOREADY) IVPB 1250 mg/250 mL       See Hyperspace for full Linked Orders Report.   1,250 mg 166.7 mL/hr over 90 Minutes Intravenous  Once 09/29/21 2308 09/30/21  0538   09/30/21 0000  vancomycin (VANCOREADY) IVPB 1250 mg/250 mL       See Hyperspace for full Linked Orders Report.   1,250 mg 166.7 mL/hr over 90 Minutes Intravenous  Once 09/29/21 2308 09/30/21 0255   09/29/21 0200  cefTRIAXone (ROCEPHIN) 2 g in sodium chloride 0.9 % 100 mL IVPB  Status:  Discontinued       See Hyperspace for full Linked Orders Report.   2 g 200 mL/hr over 30 Minutes Intravenous Every 24 hours 09/28/21 2051 09/29/21 2309   09/29/21 0200  metroNIDAZOLE (FLAGYL) IVPB 500 mg  Status:  Discontinued       See Hyperspace for full Linked Orders Report.   500 mg 100 mL/hr over 60 Minutes Intravenous Every 12 hours 09/28/21 2051 10/03/21 1849   09/28/21 1830  vancomycin (VANCOCIN) IVPB 1000 mg/200 mL premix        1,000 mg 200 mL/hr over 60 Minutes Intravenous  Once 09/28/21 1826 09/28/21 1937   09/28/21 1745  piperacillin-tazobactam (ZOSYN) IVPB 3.375 g        3.375 g 100 mL/hr over 30 Minutes Intravenous  Once 09/28/21 1736 09/28/21 1838       Data Reviewed: I have personally reviewed following labs and imaging studies  CBC: Recent Labs  Lab 11/03/21 0351 11/05/21 0405 11/07/21 0352  WBC 8.9 7.2 6.9  HGB 9.5* 9.5* 9.7*  HCT 33.0* 32.0* 32.1*  MCV 95.4 93.6 92.2  PLT 352 332 160    Basic Metabolic Panel: Recent Labs  Lab 11/04/21 0455 11/05/21 0405 11/06/21 0415 11/07/21 0352 11/08/21 0512 11/09/21 0504  NA 138 138 138 140  --  138  K 4.4 4.7 4.6 4.2  --  4.7  CL 100 99 101 103  --  103  CO2 33* 33* 31 29  --  30  GLUCOSE 92 168* 192* 144*  --  124*  BUN 33* 32* 25* 22  --  23  CREATININE 0.95 0.75 0.68 0.70  --  0.71  CALCIUM 8.8* 8.3* 8.4* 8.6*  --  8.8*  MG 1.7 1.9 2.0 2.1 2.0  --   PHOS 5.1* 4.1 3.4 3.8 4.0  --     GFR: Estimated Creatinine Clearance: 156.2 mL/min (by C-G formula based on SCr of 0.71 mg/dL). Liver Function Tests: No results for input(s): "AST", "ALT", "ALKPHOS", "BILITOT", "PROT", "ALBUMIN" in the last 168 hours.  No  results for input(s): "LIPASE", "AMYLASE" in the last 168 hours. No results for input(s): "AMMONIA" in the last 168 hours. Coagulation Profile: No results for input(s): "INR", "PROTIME" in the last 168 hours. Cardiac Enzymes: No results for input(s): "CKTOTAL", "CKMB", "CKMBINDEX", "TROPONINI" in the last 168 hours. BNP (last 3 results) No results for input(s): "PROBNP" in the last 8760 hours. HbA1C: No results for input(s): "HGBA1C" in the last 72 hours. CBG: Recent Labs  Lab 11/07/21 2014 11/08/21 0750 11/08/21 1230 11/08/21 1645 11/08/21 2126  GLUCAP 144* 158* 194* 150* 152*    Lipid Profile: No results for input(s): "CHOL", "HDL", "LDLCALC", "TRIG", "CHOLHDL", "LDLDIRECT" in the last 72 hours. Thyroid Function Tests: No results for input(s): "TSH", "T4TOTAL", "FREET4", "T3FREE", "THYROIDAB" in the last 72 hours. Anemia Panel: No results for input(s): "VITAMINB12", "FOLATE", "FERRITIN", "TIBC", "IRON", "RETICCTPCT" in the last 72 hours. Urine analysis:  Component Value Date/Time   COLORURINE STRAW (A) 11/06/2021 1900   APPEARANCEUR CLEAR (A) 11/06/2021 1900   LABSPEC 1.008 11/06/2021 1900   PHURINE 7.0 11/06/2021 1900   GLUCOSEU NEGATIVE 11/06/2021 1900   HGBUR NEGATIVE 11/06/2021 1900   BILIRUBINUR NEGATIVE 11/06/2021 1900   KETONESUR NEGATIVE 11/06/2021 1900   PROTEINUR 100 (A) 11/06/2021 1900   NITRITE NEGATIVE 11/06/2021 1900   LEUKOCYTESUR LARGE (A) 11/06/2021 1900   Sepsis Labs: '@LABRCNTIP'$ (procalcitonin:4,lacticidven:4)  No results found for this or any previous visit (from the past 240 hour(s)).        Radiology Studies last 96 hours: No results found.          LOS: 33 days       Emeterio Reeve, DO [facility] 11/09/2021, 7:11 AM   Staff may message me via secure chat in [place]  but this may not receive immediate response,  please page for urgent matters!  If 7PM-7AM, please contact  night-coverage [email or web address]  Dictation software was used to generate the above note. Typos may occur and escape review, as with typed/written notes. Please contact Dr Sheppard Coil directly for clarity if needed.

## 2021-11-09 NOTE — Progress Notes (Signed)
Physical Therapy Treatment Patient Details Name: [name]. MRN: [id number] DOB: [birth year] Today's Date: 11/09/2021   History of Present Illness [name] is a 1yoM who comes to [facility] on 09/28/21 c progression of Left chornic heel ulcer. MRI showed significant progressive osteomyelitis in the left calcaneus. S/p L BKA 6/6. PT signed off 10/24/21 due to decline in respiratory status and transition to higher level of care due to increased demands for supplemental oxygen and high risk for deterioration and intubation.PMH HTN, IDDM2, morbid obesity, depression, CAD s/p CABG 2006 and PCI to RCA in 12/2020, chronic B venous stasis on Un[place] boots, chronic L foot ulcer, COPD, OSA on CPAP nightly, ai-fib, PAD, s/p multiple toe amputations.    PT Comments    Pt was sitting on bed pan upon arriving. He is A and O x 3 but still lacks full insight of current situation. He is motivated to improve and get better so he can eventually return home. Author assisted with hygiene care after BM prior to pt performing EOB/OOB activity. He was able to roll L to short sit with mod assist + increased time. He stood 1 x from elevated bed height with +2 max assist. Has BM upon standing. Returned to supine to be cleaned. Session progressed to lateral scoot to recliner from EOB short sit. Still will require mechanical lift to return to bed from recliner. PT highly recommends DC to rehab. He is progressing but slowly. Was re-issued HEP and  given yellow thera band to work on UE strength.    Recommendations for follow up therapy are one component of a multi-disciplinary discharge planning process, led by the attending physician.  Recommendations may be updated based on patient status, additional functional criteria and insurance authorization.  Follow Up Recommendations  Skilled nursing-short term rehab (<3 hours/day) (pt's spouse/pt want rehab closer to home.)     Assistance Recommended at Discharge Frequent or  constant Supervision/Assistance  Patient can return home with the following Two people to help with bathing/dressing/bathroom;Two people to help with walking and/or transfers;Assistance with cooking/housework;Assist for transportation;Help with stairs or ramp for entrance;Direct supervision/assist for medications management;Direct supervision/assist for financial management   Equipment Recommendations  None recommended by PT       Precautions / Restrictions Precautions Precautions: Fall Precaution Comments: contact ISO Restrictions Weight Bearing Restrictions: Yes RLE Weight Bearing: Weight bearing as tolerated LLE Weight Bearing: Non weight bearing Other Position/Activity Restrictions: Lt BKA 10/03/21, shrinker delivered 7/13     Mobility  Bed Mobility Overal bed mobility: Needs Assistance Bed Mobility: Supine to Sit, Sit to Supine Rolling: Min assist, Mod assist Sidelying to sit: Mod assist Supine to sit: Mod assist (flat bed) Sit to supine: Max assist   General bed mobility comments: required max assist to return to supine in bed after EOB short sit and attempting to stand.    Transfers Overall transfer level: Needs assistance Equipment used: Rolling walker (2 wheels), None (bariatric) Transfers: Sit to/from Stand, Bed to chair/wheelchair/BSC Sit to Stand: Max assist, +2 physical assistance          Lateral/Scoot Transfers: From elevated surface, Mod assist, Max assist General transfer comment: Pt stood 1 x EOB with +2 max assist with author blocking knee > bariatric RW. Pt hyad BM upon standing. returned to bed and placed pt back on bed pan. Pt only able to stand x ~ 3 sec with poor posture. Unable to fully erect posture in standing. After 2nd BM pt was able to lateral  scoot/transfer from EOB to recliner. does require extensive time and assistance.    Ambulation/Gait      General Gait Details: unable     Balance Overall balance assessment: Needs  assistance Sitting-balance support: No upper extremity supported, Feet unsupported, Feet supported Sitting balance-Leahy Scale: Fair Sitting balance - Comments: pt is able to maintain sitting balance with close supervision   Standing balance support: Bilateral upper extremity supported, Reliant on assistive device for balance Standing balance-Leahy Scale: Zero Standing balance comment: +2 max assist to stand     Cognition Arousal/Alertness: Awake/alert Behavior During Therapy: WFL for tasks assessed/performed Overall Cognitive Status: Within Functional Limits for tasks assessed Area of Impairment: Problem solving, Following commands, Safety/judgement    Orientation Level: Disoriented to, Situation    General Comments: Pt is alert and remains motivated but is somewhat unaware of extent of limitations and length of recovery going forward. Is highly motivated        Exercises Amputee Exercises Quad Sets: AROM, 10 reps Hip Extension: Left, 10 reps, Seated, Limitations Knee Extension: 10 reps    General Comments General comments (skin integrity, edema, etc.): issued yellow theraband and re-educated pt on amputee exercise packet      Pertinent Vitals/Pain Pain Assessment Pain Assessment: No/denies pain Pain Score: 0-No pain Pain Location: LBP occasionally     PT Goals (current goals can now be found in the care plan section) Acute Rehab PT Goals Patient Stated Goal: to get better Progress towards PT goals: Progressing toward goals    Frequency    7X/week      PT Plan Current plan remains appropriate    Co-evaluation     PT goals addressed during session: Mobility/safety with mobility;Balance;Proper use of DME;Strengthening/ROM        AM-PAC PT "6 Clicks" Mobility   Outcome Measure  Help needed turning from your back to your side while in a flat bed without using bedrails?: A Lot Help needed moving from lying on your back to sitting on the side of a flat bed  without using bedrails?: A Lot Help needed moving to and from a bed to a chair (including a wheelchair)?: A Lot Help needed standing up from a chair using your arms (e.g., wheelchair or bedside chair)?: Total Help needed to walk in hospital room?: Total Help needed climbing 3-5 steps with a railing? : Total 6 Click Score: 9    End of Session Equipment Utilized During Treatment: Gait belt;Oxygen Activity Tolerance: Patient tolerated treatment well Patient left: in chair;with call bell/phone within reach;with chair alarm set Nurse Communication: Mobility status PT Visit Diagnosis: Difficulty in walking, not elsewhere classified (R26.2);Other abnormalities of gait and mobility (R26.89);Muscle weakness (generalized) (M62.81)     Time: 2707-8675 PT Time Calculation (min) (ACUTE ONLY): 55 min  Charges:  $Therapeutic Exercise: 8-22 mins $Therapeutic Activity: 38-52 mins                     Julaine Fusi PTA 11/09/21, 12:20 PM

## 2021-11-10 DIAGNOSIS — R410 Disorientation, unspecified: Secondary | ICD-10-CM | POA: Diagnosis not present

## 2021-11-10 LAB — CBC
HCT: 33 % — ABNORMAL LOW (ref 39.0–52.0)
Hemoglobin: 10.1 g/dL — ABNORMAL LOW (ref 13.0–17.0)
MCH: 27.8 pg (ref 26.0–34.0)
MCHC: 30.6 g/dL (ref 30.0–36.0)
MCV: 90.9 fL (ref 80.0–100.0)
Platelets: 246 10*3/uL (ref 150–400)
RBC: 3.63 MIL/uL — ABNORMAL LOW (ref 4.22–5.81)
RDW: 16.5 % — ABNORMAL HIGH (ref 11.5–15.5)
WBC: 5.9 10*3/uL (ref 4.0–10.5)
nRBC: 0 % (ref 0.0–0.2)

## 2021-11-10 LAB — BASIC METABOLIC PANEL
Anion gap: 5 (ref 5–15)
BUN: 28 mg/dL — ABNORMAL HIGH (ref 8–23)
CO2: 29 mmol/L (ref 22–32)
Calcium: 8.8 mg/dL — ABNORMAL LOW (ref 8.9–10.3)
Chloride: 103 mmol/L (ref 98–111)
Creatinine, Ser: 0.75 mg/dL (ref 0.61–1.24)
GFR, Estimated: >60 mL/min (ref >60)
Glucose, Bld: 180 mg/dL — ABNORMAL HIGH (ref 70–99)
Potassium: 4.8 mmol/L (ref 3.5–5.1)
Sodium: 137 mmol/L (ref 135–145)

## 2021-11-10 LAB — GLUCOSE, CAPILLARY
Glucose-Capillary: 165 mg/dL — ABNORMAL HIGH (ref 70–99)
Glucose-Capillary: 243 mg/dL — ABNORMAL HIGH (ref 70–99)
Glucose-Capillary: 169 mg/dL — ABNORMAL HIGH (ref 70–99)
Glucose-Capillary: 175 mg/dL — ABNORMAL HIGH (ref 70–99)

## 2021-11-10 MED ORDER — ACETAMINOPHEN 325 MG PO TABS: 650.0000 mg | ORAL_TABLET | Freq: Four times a day (QID) | ORAL | Status: DC | PRN

## 2021-11-10 MED ORDER — FINASTERIDE 5 MG PO TABS: 5.0000 mg | ORAL_TABLET | Freq: Every day | ORAL | 0 refills | Status: DC

## 2021-11-10 MED ORDER — INSULIN ASPART 100 UNIT/ML IJ SOLN: 12.0000 [IU] | Freq: Three times a day (TID) | INTRAMUSCULAR | 11 refills | Status: DC

## 2021-11-10 MED ORDER — NYSTATIN 100000 UNIT/GM EX POWD: Freq: Two times a day (BID) | CUTANEOUS | 0 refills | Status: DC

## 2021-11-10 MED ORDER — BUDESONIDE 0.5 MG/2ML IN SUSP: 0.5000 mg | Freq: Two times a day (BID) | RESPIRATORY_TRACT | 0 refills | Status: DC

## 2021-11-10 MED ORDER — LEVOTHYROXINE SODIUM 25 MCG PO TABS: 25.0000 ug | ORAL_TABLET | Freq: Every day | ORAL | 0 refills | Status: DC

## 2021-11-10 MED ORDER — DILTIAZEM HCL ER 120 MG PO CP12: 120.0000 mg | ORAL_CAPSULE | Freq: Two times a day (BID) | ORAL | 0 refills | Status: DC

## 2021-11-10 MED ORDER — RISPERIDONE 0.5 MG PO TABS: 0.5000 mg | ORAL_TABLET | Freq: Two times a day (BID) | ORAL | 0 refills | Status: DC

## 2021-11-10 MED ORDER — INSULIN GLARGINE-YFGN 100 UNIT/ML ~~LOC~~ SOLN: 25.0000 [IU] | Freq: Every day | SUBCUTANEOUS | 11 refills | Status: DC

## 2021-11-10 MED ORDER — INSULIN ASPART 100 UNIT/ML IJ SOLN: 0.0000 [IU] | Freq: Three times a day (TID) | INTRAMUSCULAR | 11 refills | Status: DC

## 2021-11-10 MED ORDER — ENSURE MAX PROTEIN PO LIQD: 11.0000 [oz_av] | Freq: Two times a day (BID) | ORAL | 0 refills | Status: AC

## 2021-11-10 NOTE — Progress Notes (Signed)
PROGRESS NOTE    Roshon Lennox Solders.  YWV:[id number] DOB: 07/24/1955  DOA: 09/28/2021 Date of Service: 11/10/21 PCP: [name], MD     Hospital Course:  [name] is a 66 y.o. male with medical history significant for CHF with last known LVEF of 50 to 55%, HTN, CAD, MDD, COPD, A-fib, morbid obesity (BMI 56), OSA, status post recent wound debridement and partial calcanectomy (05/16) on oral antibiotic therapy after completion of IV. He presented to the ER 09/28/2021 for bilateral lower extremity swelling, increased redness and swelling involving the left lower extremity mostly over the heel. Admitted 09/28/2021  06/1: Pt admitted to the [facility] unit with acute on chronic diastolic CHF and left heel osteomyelitis  06/2: Podiatry consulted for left foot osteomyelitis, plan for higher amputation.  Vascular surgery consulted  06/4: ID consulted for MRSA bacteremia likely source left heel wound  06/6: Pt underwent left BKA per vascular surgery  06/6 to 06/17: Pt with worsening acute on chronic hypoxic respiratory failure secondary to volume overload due to diastolic CHF.  CXR revealed new pleural effusion.  IV lasix resumed.  Requiring Bipap or [place]  06/21 to 06/27: mentation waxed and waned requiring Bipap during the day due to hypoxia  06/27: Pt requiring 100% HHFNC and transfer to the stepdown unit.  PCCM team consulted to assist with management  6/28: Pt tolerating [place] @ 50L/85%; Underwent Right-sided thoracentesis with removal of 1.2L of pleural fluid 6/29: Tolerating 45L/70% FiO2 with O2 sats 100%; remains on levophed gtt '@4'$  mcg/min to maintain map >65 6/30: altered mental status, but not from hypercapnia  7/1: Pt with intermittent agitation with worsening delirium that started 6/20.  Received prn seroquel and haldol overnight due to sleep deprivation.  This am pt calm with improvement in mentation  7/2: Pt lethargic after receiving prn haldol x1 dose and seroquel x1  dose.  Tolerating Bipap '@30'$ % FiO2.  If pt remains stable throughout the day will transfer service to [facility] starting 07/3 and PCCM team will sign off  7/3-7/4: supplemental oxygen and currently on bubbler at 10L. Can be transferred to PCU. Mental status has much improved today. PT/OT will likely need to come re-evaluate but will likely need LTAC vs SNF. CM is working on this. 7/5: still requiring 12L O2 this AM but --> 5L later in the day. 7/6: down to 4L [place] in the day, still on BiPap qhs/when asleep. PT/OT recommending SNF/LTAC, TOC working on LTAC but given O2 requirement have improved he may be able to go to SNF, TOC will look into this. Increased beta blocker given tachycardia. Cardiology also following.   7/7: potassium has been slightly elevated, worsening today. Restarted torsemide. D/w pharmacy re: risk QT prolongation w/ BB, amiodarone, antipsychotics - appreciate cardiology input: ok to d/c midodrine, restart diltiazem and d/c metoprolol, restart loop diuretic 7/8-7/9: . K is improved/down to WNL. Cr WNL, BUN trended up. Concern he is volume contracted, cardio d/c torsemide 7/8. Cardio recs: amiodarone 200 gm daily, diltiazem 90 q12 may need to increase, metoprolol held (was increased d/t HR, concern for high K and since holding this K has improved), diuresis as needed w/ torsemide which is held for now. HR remains elevated but otherwise he is approaching medical stability and TOC is working on placement.  7/10: Brief episode of confusion/hallucination which resolved spontaneously (patient states he was seeing finishes either outside the window or at his bedside table), ABG normal, UA obtained no UTI, remove Foley today  hopefully can leave this out if urinary retention has resolved 7/11-7/13: Tachycardic, asymptomatic.  Patient states his heart rate is "always in the 100s" discontinued telemetry and PICC line. We are still awaiting placement at LTAC/SNF. Diltiazem was increased by cardiology, diuresis  as needed and can likely d/c on torsemide 20 mg daily. Cardio has s/o and tele dc 07/13. Still awaiting placement.  07/14: bed confirmed at [facility] pending bariatric bed and BiPap set up, should be ok to d/c 07/15    Consultants:  Podiatry Vascular Surgery Infectious Disease Cardiology Palliative Care   Procedures: 10/03/21: left BKA per vascular surgery to treat Left osteomyelitis of the calcaneus with gangrene 10/25/21: R thoracentesis --> 1.2 L fluid   Subjective: Pt feeling well today, reports the stump shrinker rolls down toward the knee and skin was dry, but no other complaints - new one placed today and this feels better , No CP/SOB      ASSESSMENT & PLAN:   Principal Problem:   Acute delirium Active Problems:   Sepsis due to methicillin resistant Staphylococcus aureus (MRSA) ([place])   Acute on chronic respiratory failure with hypoxia and hypercapnia (HCC)   UTI (urinary tract infection)   Acute on chronic diastolic CHF (congestive heart failure) ([place])   Uncontrolled type 2 diabetes mellitus with hypoglycemia, with long-term current use of insulin (HCC)   AF (paroxysmal atrial fibrillation) (HCC)   Obesity, Class III, BMI 40-49.9 (morbid obesity) ([place])   Essential hypertension, benign   Osteomyelitis (HCC)   RLS (restless legs syndrome)   Acute urinary retention   Malnutrition of moderate degree   Generalized weakness   Acute respiratory failure with hypoxia and hypercapnia (HCC)   OSA (obstructive sleep apnea)   Hyperkalemia   Iron deficiency anemia   Pressure injury of skin of heel  Sepsis POA, resolved: due to methicillin resistant Staphylococcus aureus. RESOLVED  Blood cxs (+)MRSA. Repeat blood cx NGTD, Echo no vegetations on TTE, unable to to TEE. Likely secondary to osteomyelitis. Continue on linezolid (was on daptomycin but concern for eosinophilia so plan for linezolid until 11/01/21. Poor prognosis. High risk for intubation & cardiac arrest.  See infectious disease notes 07/03: ID signed off    Acute hypoxic/hypercapnic  resp failure -secondary to combination of CHF, hypoventilation syndrome, OSA , CO2 retention. IMPROVED/STABLE ON [place] O2 DAYTIME AND BIPAP QHS Ruled out amiodarone induced pneumonitis and dapto induced eosinophilic pneumonia.  BiPAP qhs / while asleep  Has been doing well on nasal cannula during the day   Acute on chronic diastolic CHF: EF 55 to 69% 04/2021 - RESOLVED/STABLE  Cardiology following. Has been relatively well-compensated past few days.  Cardio recs: amiodarone 200 gm daily, diltiazem 90 q12 may need to increase, metoprolol held (was increased d/t HR, concern for high K and since holding this K has improved), diuresis as needed w/ torsemide  Per cardiology can likely d/c on torsemide 20 mg daily.   Tachycardia and occasional elevated RR = SIRS, sepsis r/o HR remains elevated Diltiazem was increased by cardiology 07/12 Telemetry discontinued   Hyperkalemia - IMPROVED/RESOLVED potassium has been slightly elevated Stopped beta-blocker due to potential this may be contributing to hyperkalemia and K has improved since d/c this, started diltiazem, added back torsemide.  K has been WNL Monitor BMP every few days while inpatient   PAF:  HTN:  continue on eliquis, amio, diltiazem.   Restarted metoprolol given tachycardia and BP has been better however discontinued this due to concerns for mild  but worsening hyperkalemia.  Placed on diltiazem. Cardio following.  Ideally would increase diltiazem but BP has been low Previously was on pressors but since have been weaned off  UTI - RESOLVED:  urine cx (+)citrobacter. Completed abx course 3 days cefepime.     Acute delirium -RESOLVED:  labile mental status. Continue on risperidone. Seroquel qhs prn     DM2:  poorly controlled, HbA1c 10.2. Continue on glargine, aspart, SSI w/ accuchecks   Morbid obesity:  BMI 41.4. Complicates overall care & prognosis     Osteomyelitis of LLE: s/p L BLKA.  Completed linezolid 11/01/21 as per ID    RLS: holding home dose of mirapex   Acute urinary retention: foley catheter placed on 10/08/2021.  Foley DC'd on 6/16.  Foley needed to be replaced on 10/24/2021   IDA: H&H had been labile. No need for a tranfusion    OSA: CPAP qhs. Qualifies for NIV with elevated PCO2 on ABG.    Generalized weakness: PT/OT will have to re-evaluate pt   Malnutrition of moderate degree: related to acute illness (left heel osteomyelitis) and energy intake of less than 75% for more than 7 days, mild fat depletion.  Continue on nutritional supplements           DVT prophylaxis: Eliquis Code Status: FULL Family Communication : pt declined call to family today Disposition Plan / TOC needs: SNF Barriers to discharge / significant pending items: SNF needs to arrange BIPap should be able to go Monday              Objective: Vitals:   11/09/21 1233 11/09/21 1947 11/10/21 0419 11/10/21 0744  BP: 104/71 136/86 115/79   Pulse: (!) 120 (!) 110 (!) 114   Resp:  17 18   Temp:  97.9 F (36.6 C) 97.9 F (36.6 C)   TempSrc:      SpO2:  100% 97% 98%  Weight:      Height:        Intake/Output Summary (Last 24 hours) at 11/10/2021 0745 Last data filed at 11/10/2021 0420 Gross per 24 hour  Intake --  Output 1800 ml  Net -1800 ml    Filed Weights   10/28/21 0500 11/03/21 0350 11/07/21 0451  Weight: (!) 154.4 kg (!) 167.8 kg (!) 173.7 kg    Examination:  Constitutional:  VS as above General Appearance: alert, well-developed, well-nourished, NAD Eyes: Normal lids and conjunctive, non-icteric sclera Neck: No masses, trachea midline Respiratory: Normal respiratory effort Breath sounds normal, no wheeze/rhonchi/rales Cardiovascular: S1/S2 normal Trace lower extremity edema RLE Gastrointestinal: Nontender, no masses but habitus limits exam.  Musculoskeletal:  No clubbing/cyanosis of digits Left  BKA Neurological: No cranial nerve deficit on limited exam Psychiatric: Normal judgment/insight Depressed/flat mood and affect       Scheduled Medications:   amiodarone  200 mg Oral Daily   apixaban  5 mg Oral BID   vitamin C  500 mg Oral Daily   aspirin EC  81 mg Oral Daily   budesonide (PULMICORT) nebulizer solution  0.5 mg Nebulization BID   chlorhexidine  15 mL Mouth Rinse BID   Chlorhexidine Gluconate Cloth  6 each Topical Daily   cholecalciferol  1,000 Units Oral Daily   diltiazem  120 mg Oral Q12H   ezetimibe  10 mg Oral Daily   ferrous sulfate  325 mg Oral Q breakfast   finasteride  5 mg Oral Daily   insulin aspart  0-20 Units Subcutaneous TID WC   insulin  aspart  12 Units Subcutaneous TID WC   insulin glargine-yfgn  25 Units Subcutaneous QHS   insulin glargine-yfgn  5 Units Subcutaneous Daily   ipratropium-albuterol  3 mL Nebulization BID   levothyroxine  25 mcg Oral Q0600   multivitamin with minerals  1 tablet Oral Daily   nystatin   Topical BID   primidone  250 mg Oral BID   Ensure Max Protein  11 oz Oral BID   risperiDONE  0.5 mg Oral BID   sodium chloride flush  10-40 mL Intracatheter Q12H   vitamin B-12  1,000 mcg Oral Daily    Continuous Infusions:  sodium chloride Stopped (10/25/21 2209)    PRN Medications:  acetaminophen **OR** acetaminophen, albuterol, metoprolol tartrate, nitroGLYCERIN, ondansetron **OR** ondansetron (ZOFRAN) IV, mouth rinse, QUEtiapine, sodium chloride flush  Antimicrobials:  Anti-infectives (From admission, onward)    Start     Dose/Rate Route Frequency Ordered Stop   10/26/21 2200  linezolid (ZYVOX) tablet 600 mg        600 mg Oral Every 12 hours 10/26/21 1743 11/01/21 2139   10/24/21 2200  linezolid (ZYVOX) IVPB 600 mg  Status:  Discontinued        600 mg 300 mL/hr over 60 Minutes Intravenous Every 12 hours 10/24/21 2157 10/26/21 1743   10/24/21 1800  ceFEPIme (MAXIPIME) 2 g in sodium chloride 0.9 % 100 mL IVPB        2  g 200 mL/hr over 30 Minutes Intravenous Every 8 hours 10/24/21 1612 10/26/21 1730   10/24/21 1030  sulfamethoxazole-trimethoprim (BACTRIM DS) 800-160 MG per tablet 1 tablet  Status:  Discontinued        1 tablet Oral Every 12 hours 10/24/21 0944 10/24/21 1608   10/23/21 2000  DAPTOmycin (CUBICIN) 800 mg in sodium chloride 0.9 % IVPB  Status:  Discontinued        6 mg/kg  129.2 kg (Adjusted) 132 mL/hr over 30 Minutes Intravenous Every 24 hours 10/23/21 0745 10/24/21 1803   10/22/21 0945  cefTRIAXone (ROCEPHIN) 1 g in sodium chloride 0.9 % 100 mL IVPB        1 g 200 mL/hr over 30 Minutes Intravenous Every 24 hours 10/22/21 0848 10/24/21 1845   10/04/21 1000  DAPTOmycin (CUBICIN) 800 mg in sodium chloride 0.9 % IVPB  Status:  Discontinued        6 mg/kg  129.2 kg (Adjusted) 132 mL/hr over 30 Minutes Intravenous Every 24 hours 10/03/21 1648 10/23/21 0745   10/03/21 0000  vancomycin (VANCOREADY) IVPB 1500 mg/300 mL        1,500 mg 150 mL/hr over 120 Minutes Intravenous Every 12 hours 10/02/21 1348 10/03/21 2359   09/30/21 1330  cefTRIAXone (ROCEPHIN) 2 g in sodium chloride 0.9 % 100 mL IVPB  Status:  Discontinued        2 g 200 mL/hr over 30 Minutes Intravenous Daily 09/30/21 1233 10/03/21 1849   09/30/21 1200  vancomycin (VANCOREADY) IVPB 1500 mg/300 mL  Status:  Discontinued        1,500 mg 150 mL/hr over 120 Minutes Intravenous Every 12 hours 09/30/21 0120 09/30/21 0927   09/30/21 1200  vancomycin (VANCOREADY) IVPB 1250 mg/250 mL  Status:  Discontinued        1,250 mg 166.7 mL/hr over 90 Minutes Intravenous Every 12 hours 09/30/21 0927 10/02/21 1348   09/30/21 0000  vancomycin (VANCOREADY) IVPB 1250 mg/250 mL       See Hyperspace for full Linked Orders Report.   [place]  mg 166.7 mL/hr over 90 Minutes Intravenous  Once 09/29/21 2308 09/30/21 0538   09/30/21 0000  vancomycin (VANCOREADY) IVPB 1250 mg/250 mL       See Hyperspace for full Linked Orders Report.   1,250 mg 166.7 mL/hr over 90  Minutes Intravenous  Once 09/29/21 2308 09/30/21 0255   09/29/21 0200  cefTRIAXone (ROCEPHIN) 2 g in sodium chloride 0.9 % 100 mL IVPB  Status:  Discontinued       See Hyperspace for full Linked Orders Report.   2 g 200 mL/hr over 30 Minutes Intravenous Every 24 hours 09/28/21 2051 09/29/21 2309   09/29/21 0200  metroNIDAZOLE (FLAGYL) IVPB 500 mg  Status:  Discontinued       See Hyperspace for full Linked Orders Report.   500 mg 100 mL/hr over 60 Minutes Intravenous Every 12 hours 09/28/21 2051 10/03/21 1849   09/28/21 1830  vancomycin (VANCOCIN) IVPB 1000 mg/200 mL premix        1,000 mg 200 mL/hr over 60 Minutes Intravenous  Once 09/28/21 1826 09/28/21 1937   09/28/21 1745  piperacillin-tazobactam (ZOSYN) IVPB 3.375 g        3.375 g 100 mL/hr over 30 Minutes Intravenous  Once 09/28/21 1736 09/28/21 1838       Data Reviewed: I have personally reviewed following labs and imaging studies  CBC: Recent Labs  Lab 11/05/21 0405 11/07/21 0352 11/10/21 0638  WBC 7.2 6.9 5.9  HGB 9.5* 9.7* 10.1*  HCT 32.0* 32.1* 33.0*  MCV 93.6 92.2 90.9  PLT 332 300 350    Basic Metabolic Panel: Recent Labs  Lab 11/04/21 0455 11/05/21 0405 11/06/21 0415 11/07/21 0352 11/08/21 0512 11/09/21 0504  NA 138 138 138 140  --  138  K 4.4 4.7 4.6 4.2  --  4.7  CL 100 99 101 103  --  103  CO2 33* 33* 31 29  --  30  GLUCOSE 92 168* 192* 144*  --  124*  BUN 33* 32* 25* 22  --  23  CREATININE 0.95 0.75 0.68 0.70  --  0.71  CALCIUM 8.8* 8.3* 8.4* 8.6*  --  8.8*  MG 1.7 1.9 2.0 2.1 2.0  --   PHOS 5.1* 4.1 3.4 3.8 4.0  --     GFR: Estimated Creatinine Clearance: 156.2 mL/min (by C-G formula based on SCr of 0.71 mg/dL). Liver Function Tests: No results for input(s): "AST", "ALT", "ALKPHOS", "BILITOT", "PROT", "ALBUMIN" in the last 168 hours.  No results for input(s): "LIPASE", "AMYLASE" in the last 168 hours. No results for input(s): "AMMONIA" in the last 168 hours. Coagulation Profile: No  results for input(s): "INR", "PROTIME" in the last 168 hours. Cardiac Enzymes: No results for input(s): "CKTOTAL", "CKMB", "CKMBINDEX", "TROPONINI" in the last 168 hours. BNP (last 3 results) No results for input(s): "PROBNP" in the last 8760 hours. HbA1C: No results for input(s): "HGBA1C" in the last 72 hours. CBG: Recent Labs  Lab 11/08/21 2126 11/09/21 0758 11/09/21 1201 11/09/21 1750 11/09/21 2059  GLUCAP 152* 139* 240* 243* 148*    Lipid Profile: No results for input(s): "CHOL", "HDL", "LDLCALC", "TRIG", "CHOLHDL", "LDLDIRECT" in the last 72 hours. Thyroid Function Tests: No results for input(s): "TSH", "T4TOTAL", "FREET4", "T3FREE", "THYROIDAB" in the last 72 hours. Anemia Panel: No results for input(s): "VITAMINB12", "FOLATE", "FERRITIN", "TIBC", "IRON", "RETICCTPCT" in the last 72 hours. Urine analysis:    Component Value Date/Time   COLORURINE STRAW (A) 11/06/2021 1900   APPEARANCEUR CLEAR (A) 11/06/2021 1900  LABSPEC 1.008 11/06/2021 1900   PHURINE 7.0 11/06/2021 1900   GLUCOSEU NEGATIVE 11/06/2021 1900   HGBUR NEGATIVE 11/06/2021 1900   BILIRUBINUR NEGATIVE 11/06/2021 1900   KETONESUR NEGATIVE 11/06/2021 1900   PROTEINUR 100 (A) 11/06/2021 1900   NITRITE NEGATIVE 11/06/2021 1900   LEUKOCYTESUR LARGE (A) 11/06/2021 1900   Sepsis Labs: '@LABRCNTIP'$ (procalcitonin:4,lacticidven:4)  No results found for this or any previous visit (from the past 240 hour(s)).        Radiology Studies last 96 hours: No results found.          LOS: 74 days       Emeterio Reeve, DO [facility] 11/10/2021, 7:45 AM   Staff may message me via secure chat in [place]  but this may not receive immediate response,  please page for urgent matters!  If 7PM-7AM, please contact night-coverage [email or web address]  Dictation software was used to generate the above note. Typos may occur and escape review, as with typed/written notes. Please contact Dr Sheppard Coil  directly for clarity if needed.

## 2021-11-10 NOTE — TOC Progression Note (Addendum)
Transition of Care (TOC) - Progression Note    Patient Details  Name: [name]. MRN: [id number] Date of Birth: 1956/01/10  Transition of Care [facility]) CM/SW [place], [place] Phone Number: 11/10/2021, 8:25 AM  Clinical Narrative:     CSW spoke with patient's spouse about SNF,  Patient's spouse has declined snf bed offers in the past due to limited offers with patient's high care needs.   CSW has re sent out SNF referrals for bed search.   CSW informed spouse of new bed offer from [place] (now called [facility] for nursing) in [place]. Spouse continues to prefer local facilities, CSW has explained multiple times limitations in bed offers. MD has also discussed this with spouse, that when patient is medically stable to dc must choose SNFs with bed offers, as facilities have a right to decline beds.   CSW notes per PT patient's family interested in [facility]. [facility] has declined bed offer once more, reporting patient exceeds their weight limit.   Reason for denials in SNFs that have declined include bariatric, not in network with insurance, cannot meet patient care needs, or facility full no bed availability.   CSW did reach out directly to [facility] with [facility] who reports she may re assess their decision and come see patient at some point today for in persona assessment.        Expected Discharge Plan: [place] Barriers to Discharge: Continued Medical Work up  Expected Discharge Plan and Services Expected Discharge Plan: [place] In-house Referral: NA   Post Acute Care Choice: Resumption of Svcs/PTA Provider Living arrangements for the past 2 months: Single Family Home                                       Social Determinants of Health (SDOH) Interventions    Readmission Risk Interventions    09/29/2021   11:12 AM  Readmission Risk Prevention Plan  Transportation Screening  Complete  PCP or Specialist Appt within 3-5 Days Complete  HRI or [place] Complete  Social Work Consult for [place] Planning/Counseling Complete  Palliative Care Screening Not Applicable  Medication Review Press photographer) Complete

## 2021-11-10 NOTE — Care Management Important Message (Signed)
Important Message  Patient Details  Name: [name]. MRN: [id number] Date of Birth: 11/14/1955   Medicare Important Message Given:  Yes  Patient is in an isolation room so I reviewed the Important Message from Medicare with him over the phone [phone]).  Stated he is in agreement with his discharge plan and I wished him a speedy recovery and thanked for his time.     Juliann Pulse A Shareka Casale 11/10/2021, 1:48 PM

## 2021-11-10 NOTE — Progress Notes (Signed)
Occupational Therapy Treatment Patient Details Name: [name]. MRN: [id number] DOB: July 25, 1955 Today's Date: 11/10/2021   History of present illness [name] is a 82yoM who comes to [facility] on 09/28/21 c progression of Left chornic heel ulcer. MRI showed significant progressive osteomyelitis in the left calcaneus. S/p L BKA 6/6. PT signed off 10/24/21 due to decline in respiratory status and transition to higher level of care due to increased demands for supplemental oxygen and high risk for deterioration and intubation.PMH HTN, IDDM2, morbid obesity, depression, CAD s/p CABG 2006 and PCI to RCA in 12/2020, chronic B venous stasis on Unna boots, chronic L foot ulcer, COPD, OSA on CPAP nightly, ai-fib, PAD, s/p multiple toe amputations.   OT comments  Pt greeted in chair, agreeable to OT tx session. Pt is alert, oriented to self, place, month (not year or day), generally to situation. Pt declined STS attempts therefore tx session targeted improving ADL independence and therapeutic activity through dynamic sitting tasks. Improvements noted in dynamic sitting balance, ADL independence however pt continues to perform below PLOF and would benefit from discharge to STR to address deficits.     Recommendations for follow up therapy are one component of a multi-disciplinary discharge planning process, led by the attending physician.  Recommendations may be updated based on patient status, additional functional criteria and insurance authorization.    Follow Up Recommendations  Skilled nursing-short term rehab (<3 hours/day)    Assistance Recommended at Discharge Frequent or constant Supervision/Assistance  Patient can return home with the following  A lot of help with bathing/dressing/bathroom;Assistance with cooking/housework;Help with stairs or ramp for entrance;Assist for transportation;Two people to help with walking and/or transfers   Equipment Recommendations  Other (comment) (per next  venue of care)    Recommendations for Other Services      Precautions / Restrictions Precautions Precautions: Fall Precaution Comments: contact ISO Restrictions Weight Bearing Restrictions: Yes RLE Weight Bearing: Weight bearing as tolerated LLE Weight Bearing: Non weight bearing       Mobility Bed Mobility               General bed mobility comments: NT pt in chair pre/post session    Transfers                   General transfer comment: pt declined STS attempt- reports he performed with PT earlier this am     Balance Overall balance assessment: Needs assistance Sitting-balance support: No upper extremity supported, Feet unsupported, Feet supported Sitting balance-Leahy Scale: Fair Sitting balance - Comments: supervision-CGA for dynamic sitting balance tasks                                   ADL either performed or assessed with clinical judgement   ADL Overall ADL's : Needs assistance/impaired                                       General ADL Comments: SET UP for grooming tasks, MIN A for UB dressing in chair    Extremity/Trunk Assessment              Vision       Perception     Praxis      Cognition Arousal/Alertness: Awake/alert Behavior During Therapy: WFL for tasks assessed/performed Overall Cognitive Status: Within Functional Limits  for tasks assessed Area of Impairment: Problem solving, Following commands, Safety/judgement                                        Exercises Other Exercises Other Exercises: dynamic sitting balance task in preparation for functional transfers- pt reaches 10x 2 sets to target placed contralateral at shoulder level    Shoulder Instructions       General Comments vss througout    Pertinent Vitals/ Pain       Pain Assessment Pain Assessment: No/denies pain  Home Living                                          Prior  Functioning/Environment              Frequency  Min 3X/week        Progress Toward Goals  OT Goals(current goals can now be found in the care plan section)  Progress towards OT goals: Progressing toward goals     Plan Discharge plan remains appropriate    Co-evaluation                 AM-PAC OT "6 Clicks" Daily Activity     Outcome Measure   Help from another person eating meals?: None Help from another person taking care of personal grooming?: None Help from another person toileting, which includes using toliet, bedpan, or urinal?: Total Help from another person bathing (including washing, rinsing, drying)?: A Lot Help from another person to put on and taking off regular upper body clothing?: A Little Help from another person to put on and taking off regular lower body clothing?: A Lot 6 Click Score: 16    End of Session    OT Visit Diagnosis: Unsteadiness on feet (R26.81);Repeated falls (R29.6);Muscle weakness (generalized) (M62.81)   Activity Tolerance Patient tolerated treatment well   Patient Left in chair;with call bell/phone within reach   Nurse Communication Mobility status        Time: 6967-8938 OT Time Calculation (min): 25 min  Charges: OT General Charges $OT Visit: 1 Visit OT Treatments $Self Care/Home Management : 8-22 mins $Therapeutic Activity: 8-22 mins  Shanon Payor, OTD OTR/L  11/10/21, 11:42 AM

## 2021-11-10 NOTE — Plan of Care (Signed)
  Problem: Education: Goal: Ability to demonstrate management of disease process will improve Outcome: Progressing Goal: Ability to verbalize understanding of medication therapies will improve Outcome: Progressing Goal: Individualized Educational Video(s) Outcome: Progressing   Problem: Activity: Goal: Capacity to carry out activities will improve Outcome: Progressing   Problem: Education: Goal: Knowledge of General Education information will improve Description: Including pain rating scale, medication(s)/side effects and non-pharmacologic comfort measures Outcome: Progressing

## 2021-11-10 NOTE — Progress Notes (Signed)
Physical Therapy Treatment Patient Details Name: [name]. MRN: [id number] DOB: [birth year] Today's Date: 11/10/2021   History of Present Illness [name] is a 1yoM who comes to [facility] on 09/[facility]21 c progression of Left chornic heel ulcer. MRI showed significant progressive osteomyelitis in the left calcaneus. S/p L BKA 6/6. PT signed off 10/24/21 due to decline in respiratory status and transition to higher level of care due to increased demands for supplemental oxygen and high risk for deterioration and intubation.PMH HTN, IDDM2, morbid obesity, depression, CAD s/p CABG 2006 and PCI to RCA in 12/2020, chronic B venous stasis on Unna boots, chronic L foot ulcer, COPD, OSA on CPAP nightly, ai-fib, PAD, s/p multiple toe amputations.    PT Comments    Pt was long sitting in bed upon arriving. He is A and O x 3 but still does not fully understand current situation. Does however remain motivated and continues to progress towards PT goals. Pt was able to roll L to short sit with less assistance and did stand 2 x EOB prior to lateral transfer to recliner. Will continue to require mechanical lift to return to bed 2/2 to recliner height being so low. Acute PT will continue to follow and progress as able per current POC.    Recommendations for follow up therapy are one component of a multi-disciplinary discharge planning process, led by the attending physician.  Recommendations may be updated based on patient status, additional functional criteria and insurance authorization.  Follow Up Recommendations  Skilled nursing-short term rehab (<3 hours/day)     Assistance Recommended at Discharge Frequent or constant Supervision/Assistance  Patient can return home with the following A lot of help with walking and/or transfers;Two people to help with bathing/dressing/bathroom;Assistance with cooking/housework;Direct supervision/assist for medications management;Assist for transportation;Help with stairs  or ramp for entrance   Equipment Recommendations  Other (comment) (defer to next level of care)       Precautions / Restrictions Precautions Precautions: Fall Precaution Comments: contact ISO Restrictions Weight Bearing Restrictions: Yes RLE Weight Bearing: Weight bearing as tolerated LLE Weight Bearing: Non weight bearing Other Position/Activity Restrictions: Lt BKA 10/03/21, Shrinker delivered 7/13 (shrinker too small? per pt. orthostist suppose to revisit)     Mobility  Bed Mobility Overal bed mobility: Needs Assistance Bed Mobility: Supine to Sit, Sit to Supine Rolling: Min guard, Min assist Sidelying to sit: Min assist Supine to sit: Min assist     General bed mobility comments: min assist to achieve EOB short sit. pt rolled L short sit with min assist    Transfers Overall transfer level: Needs assistance Equipment used: Rollator (4 wheels) (Bariatric) Transfers: Sit to/from Stand Sit to Stand: +2 physical assistance, +2 safety/equipment, Mod assist, From elevated surface, Max assist          Lateral/Scoot Transfers: From elevated surface, Mod assist, Max assist General transfer comment: Pt was able to stand 2 x with +2 assistance for about 15 sec each trial. more erect posture than previous standing trials.    Ambulation/Gait    General Gait Details: unable     Balance Overall balance assessment: Needs assistance Sitting-balance support: Bilateral upper extremity supported (RLE support) Sitting balance-Leahy Scale: Fair     Standing balance support: Bilateral upper extremity supported, Reliant on assistive device for balance Standing balance-Leahy Scale: Zero    Cognition Arousal/Alertness: Awake/alert Behavior During Therapy: WFL for tasks assessed/performed Overall Cognitive Status: Within Functional Limits for tasks assessed    Orientation Level: Disoriented to, Situation  Current Attention Level: Sustained   Following Commands: Follows one step  commands consistently, Follows one step commands with increased time Safety/Judgement: Decreased awareness of safety, Decreased awareness of deficits   Problem Solving: Requires verbal cues, Requires tactile cues General Comments: pt A and O x 3           General Comments General comments (skin integrity, edema, etc.): vss througout      Pertinent Vitals/Pain Pain Assessment Pain Assessment: 0-10 Pain Score: 6  Faces Pain Scale: Hurts even more Pain Location: LBP " I think its because of the bed" Pain Descriptors / Indicators: Grimacing Pain Intervention(s): Monitored during session, Limited activity within patient's tolerance, Premedicated before session, Repositioned     PT Goals (current goals can now be found in the care plan section) Acute Rehab PT Goals Patient Stated Goal: rehab then home Progress towards PT goals: Progressing toward goals    Frequency    7X/week      PT Plan Current plan remains appropriate    Co-evaluation     PT goals addressed during session: Mobility/safety with mobility;Balance;Proper use of DME;Strengthening/ROM        AM-PAC PT "6 Clicks" Mobility   Outcome Measure  Help needed turning from your back to your side while in a flat bed without using bedrails?: A Lot Help needed moving from lying on your back to sitting on the side of a flat bed without using bedrails?: A Lot Help needed moving to and from a bed to a chair (including a wheelchair)?: A Lot Help needed standing up from a chair using your arms (e.g., wheelchair or bedside chair)?: Total Help needed to walk in hospital room?: Total Help needed climbing 3-5 steps with a railing? : Total 6 Click Score: 9    End of Session Equipment Utilized During Treatment: Gait belt;Oxygen Activity Tolerance: Patient tolerated treatment well Patient left: in chair;with call bell/phone within reach;with chair alarm set Nurse Communication: Mobility status PT Visit Diagnosis:  Difficulty in walking, not elsewhere classified (R26.2);Other abnormalities of gait and mobility (R26.89);Muscle weakness (generalized) (M62.81)     Time: 5697-9480 PT Time Calculation (min) (ACUTE ONLY): 39 min  Charges:  $Therapeutic Activity: 38-52 mins                     [name] PTA 11/10/21, 1:32 PM

## 2021-11-10 NOTE — TOC Progression Note (Signed)
Transition of Care (TOC) - Progression Note    Patient Details  Name: [name]. MRN: [id number] Date of Birth: [birth year]  Transition of Care [facility]) CM/SW [place], [place] Phone Number: 11/10/2021, 2:44 PM  Clinical Narrative:     CSW spoke with Lavella Lemons at [facility], answered all questions regarding patient referral and she reports it was sent to nursing review. After discussion regarding patient's mobility needs etc she reports they are able to make a bed offer. Relayed to spouse who is agreeable as this is only local bed offer in [place].   Tanya with [facility] aware of need for bariatric bed and bipap to be ordered to facility.   Insurance auth started and approved until 7/18 Candace Cruise ID: [id number]  Pending delivery of bariatric bed and bipap to facility for patient to be discharged to [facility].     Expected Discharge Plan: [place] Barriers to Discharge: Continued Medical Work up  Expected Discharge Plan and Services Expected Discharge Plan: [place] In-house Referral: NA   Post Acute Care Choice: Resumption of Svcs/PTA Provider Living arrangements for the past 2 months: Single Family Home                                       Social Determinants of Health (SDOH) Interventions    Readmission Risk Interventions    09/29/2021   11:12 AM  Readmission Risk Prevention Plan  Transportation Screening Complete  PCP or Specialist Appt within 3-5 Days Complete  HRI or [place] Complete  Social Work Consult for [place] Planning/Counseling Complete  Palliative Care Screening Not Applicable  Medication Review Press photographer) Complete

## 2021-11-10 NOTE — Progress Notes (Signed)
   11/09/21 2243  BiPAP/CPAP/SIPAP  BiPAP/CPAP/SIPAP Pt Type Adult  Mask Type Full face mask  Mask Size Large  Set Rate 10 breaths/min  Respiratory Rate 17 breaths/min  IPAP 14 cmH20  EPAP 8 cmH2O  Oxygen Percent 40 %  Minute Ventilation 10  Leak 8  [facility] Inspiratory Pressure (PIP) 14  Tidal Volume (Vt) 631  BiPAP/CPAP/SIPAP BiPAP  Patient Home Equipment No  Auto Titrate No  Press High Alarm 25 cmH2O  Press Low Alarm 2 cmH2O    Banks, LCSW [phone]

## 2021-11-11 LAB — GLUCOSE, CAPILLARY
Glucose-Capillary: 164 mg/dL — ABNORMAL HIGH (ref 70–99)
Glucose-Capillary: 225 mg/dL — ABNORMAL HIGH (ref 70–99)
Glucose-Capillary: 178 mg/dL — ABNORMAL HIGH (ref 70–99)
Glucose-Capillary: 159 mg/dL — ABNORMAL HIGH (ref 70–99)

## 2021-11-11 NOTE — Plan of Care (Signed)
  Problem: Nutrition: Goal: Adequate nutrition will be maintained Outcome: Progressing   Problem: Elimination: Goal: Will not experience complications related to bowel motility Outcome: Progressing   Problem: Pain Managment: Goal: General experience of comfort will improve Outcome: Progressing   

## 2021-11-11 NOTE — Plan of Care (Signed)

## 2021-11-11 NOTE — Progress Notes (Signed)
PROGRESS NOTE    [name].  WPY:[id number] DOB: 1955-12-11  DOA: 09/28/2021 Date of Service: 11/11/21 PCP: [name], MD     Hospital Course:  Alec Less. is a 66 y.o. male with medical history significant for CHF with last known LVEF of 50 to 55%, HTN, CAD, MDD, COPD, A-fib, morbid obesity (BMI 10), OSA, status post recent wound debridement and partial calcanectomy (05/16) on oral antibiotic therapy after completion of IV. He presented to the ER 09/28/2021 for bilateral lower extremity swelling, increased redness and swelling involving the left lower extremity mostly over the heel. Admitted 09/28/2021  06/1: Pt admitted to the [facility] unit with acute on chronic diastolic CHF and left heel osteomyelitis  06/2: Podiatry consulted for left foot osteomyelitis, plan for higher amputation.  Vascular surgery consulted  06/4: ID consulted for MRSA bacteremia likely source left heel wound  06/6: Pt underwent left BKA per vascular surgery  06/6 to 06/17: Pt with worsening acute on chronic hypoxic respiratory failure secondary to volume overload due to diastolic CHF.  CXR revealed new pleural effusion.  IV lasix resumed.  Requiring Bipap or [place]  06/21 to 06/27: mentation waxed and waned requiring Bipap during the day due to hypoxia  06/27: Pt requiring 100% HHFNC and transfer to the stepdown unit.  PCCM team consulted to assist with management  6/28: Pt tolerating [place] @ 50L/85%; Underwent Right-sided thoracentesis with removal of 1.2L of pleural fluid 6/29: Tolerating 45L/70% FiO2 with O2 sats 100%; remains on levophed gtt '@4'$  mcg/min to maintain map >65 6/30: altered mental status, but not from hypercapnia  7/1: Pt with intermittent agitation with worsening delirium that started 6/20.  Received prn seroquel and haldol overnight due to sleep deprivation.  This am pt calm with improvement in mentation  7/2: Pt lethargic after receiving prn haldol x1 dose and seroquel x1  dose.  Tolerating Bipap '@30'$ % FiO2.  If pt remains stable throughout the day will transfer service to [facility] starting 07/3 and PCCM team will sign off  7/3-7/4: supplemental oxygen and currently on bubbler at 10L. Can be transferred to PCU. Mental status has much improved today. PT/OT will likely need to come re-evaluate but will likely need LTAC vs SNF. CM is working on this. 7/5: still requiring 12L O2 this AM but --> 5L later in the day. 7/6: down to 4L [place] in the day, still on BiPap qhs/when asleep. PT/OT recommending SNF/LTAC, TOC working on LTAC but given O2 requirement have improved he may be able to go to SNF, TOC will look into this. Increased beta blocker given tachycardia. Cardiology also following.   7/7: potassium has been slightly elevated, worsening today. Restarted torsemide. D/w pharmacy re: risk QT prolongation w/ BB, amiodarone, antipsychotics - appreciate cardiology input: ok to d/c midodrine, restart diltiazem and d/c metoprolol, restart loop diuretic 7/8-7/9: . K is improved/down to WNL. Cr WNL, BUN trended up. Concern he is volume contracted, cardio d/c torsemide 7/8. Cardio recs: amiodarone 200 gm daily, diltiazem 90 q12 may need to increase, metoprolol held (was increased d/t HR, concern for high K and since holding this K has improved), diuresis as needed w/ torsemide which is held for now. HR remains elevated but otherwise he is approaching medical stability and TOC is working on placement.  7/10: Brief episode of confusion/hallucination which resolved spontaneously (patient states he was seeing finishes either outside the window or at his bedside table), ABG normal, UA obtained no UTI, remove Foley today  hopefully can leave this out if urinary retention has resolved 7/11-7/13: Tachycardic, asymptomatic.  Patient states his heart rate is "always in the 100s" discontinued telemetry and PICC line. We are still awaiting placement at LTAC/SNF. Diltiazem was increased by cardiology, diuresis  as needed and can likely d/c on torsemide 20 mg daily. Cardio has s/o and tele dc 07/13. Still awaiting placement.  07/14: bed confirmed at [facility] pending bariatric bed and BiPap set up, should be ok to d/c 07/15    Consultants:  Podiatry Vascular Surgery Infectious Disease Cardiology Palliative Care   Procedures: 10/03/21: left BKA per vascular surgery to treat Left osteomyelitis of the calcaneus with gangrene 10/25/21: R thoracentesis --> 1.2 L fluid   Subjective: No acute overnight events.  Patient feels fine     ASSESSMENT & PLAN:   Principal Problem:   Acute delirium Active Problems:   Sepsis due to methicillin resistant Staphylococcus aureus (MRSA) (HCC)   Acute on chronic respiratory failure with hypoxia and hypercapnia (HCC)   UTI (urinary tract infection)   Acute on chronic diastolic CHF (congestive heart failure) ([place])   Uncontrolled type 2 diabetes mellitus with hypoglycemia, with long-term current use of insulin (HCC)   AF (paroxysmal atrial fibrillation) (HCC)   Obesity, Class III, BMI 40-49.9 (morbid obesity) ([place])   Essential hypertension, benign   Osteomyelitis (HCC)   RLS (restless legs syndrome)   Acute urinary retention   Malnutrition of moderate degree   Generalized weakness   Acute respiratory failure with hypoxia and hypercapnia (HCC)   OSA (obstructive sleep apnea)   Hyperkalemia   Iron deficiency anemia   Pressure injury of skin of heel  Sepsis POA, resolved: due to methicillin resistant Staphylococcus aureus. RESOLVED  Blood cxs (+)MRSA. Repeat blood cx NGTD, Echo no vegetations on TTE, unable to to TEE. Likely secondary to osteomyelitis. Continue on linezolid (was on daptomycin but concern for eosinophilia so plan for linezolid until 11/01/21. Poor prognosis. High risk for intubation & cardiac arrest. See infectious disease notes 07/03: ID signed off    Acute hypoxic/hypercapnic  resp failure -secondary to combination of CHF,  hypoventilation syndrome, OSA , CO2 retention. IMPROVED/STABLE ON [place] O2 DAYTIME AND BIPAP QHS Ruled out amiodarone induced pneumonitis and dapto induced eosinophilic pneumonia.  BiPAP qhs / while asleep  Has been doing well on nasal cannula during the day   Acute on chronic diastolic CHF: EF 55 to 71% 04/2021 - RESOLVED/STABLE  Cardiology following. Has been relatively well-compensated past few days.  Cardio recs: amiodarone 200 gm daily, diltiazem 90 q12 may need to increase, metoprolol held (was increased d/t HR, concern for high K and since holding this K has improved), diuresis as needed w/ torsemide  Per cardiology will DC on torsemide 20 mg daily  Tachycardia and occasional elevated RR = SIRS, sepsis r/o HR remains elevated Diltiazem was increased by cardiology 07/12 Telemetry discontinued   Hyperkalemia - IMPROVED/RESOLVED potassium has been slightly elevated Stopped beta-blocker due to potential this may be contributing to hyperkalemia and K has improved since d/c this, started diltiazem, added back torsemide.  K has been WNL Monitor BMP every few days while inpatient   PAF:  HTN:  continue on eliquis, amio, diltiazem.   Restarted metoprolol given tachycardia and BP has been better however discontinued this due to concerns for mild but worsening hyperkalemia.  Placed on diltiazem. Cardio following.  Ideally would increase diltiazem but BP has been low Previously was on pressors but since have been weaned  off  UTI - RESOLVED:  urine cx (+)citrobacter. Completed abx course 3 days cefepime.     Acute delirium -RESOLVED:  labile mental status. Continue on risperidone. Seroquel qhs prn     DM2:  poorly controlled, HbA1c 10.2. Continue on glargine, aspart, SSI w/ accuchecks   Morbid obesity:  BMI 41.4. Complicates overall care & prognosis    Osteomyelitis of LLE: s/p L BLKA.  Completed linezolid 11/01/21 as per ID    RLS: holding home dose of mirapex   Acute urinary  retention: foley catheter placed on 10/08/2021.  Foley DC'd on 6/16.  Foley needed to be replaced on 10/24/2021   IDA: H&H had been labile. No need for a tranfusion    OSA: CPAP qhs. Qualifies for NIV with elevated PCO2 on ABG.    Generalized weakness: PT/OT will have to re-evaluate pt   Malnutrition of moderate degree: related to acute illness (left heel osteomyelitis) and energy intake of less than 75% for more than 7 days, mild fat depletion.  Continue on nutritional supplements           DVT prophylaxis: Eliquis Code Status: FULL Family Communication : pt declined call to family today Disposition Plan / TOC needs: SNF Barriers to discharge / significant pending items: SNF needs to arrange BIPap should be able to go Monday.  Unsafe discharge plan.  Plan discharge Monday 7/17             Objective: Vitals:   11/10/21 2154 11/10/21 2339 11/11/21 0335 11/11/21 0818  BP: 117/74 (!) 141/90 116/79 (!) 129/91  Pulse: (!) 114 (!) 115 (!) 115 (!) 114  Resp:  18 18   Temp:  98.2 F (36.8 C) 97.9 F (36.6 C) 97.9 F (36.6 C)  TempSrc:   Oral   SpO2:  100% 92% 93%  Weight:      Height:        Intake/Output Summary (Last 24 hours) at 11/11/2021 1328 Last data filed at 11/11/2021 1246 Gross per 24 hour  Intake 10 ml  Output 1450 ml  Net -1440 ml   Filed Weights   10/28/21 0500 11/03/21 0350 11/07/21 0451  Weight: (!) 154.4 kg (!) 167.8 kg (!) 173.7 kg    Examination:  Constitutional:  VS as above General Appearance: alert, well-developed, well-nourished, NAD Eyes: Normal lids and conjunctive, non-icteric sclera Neck: No masses, trachea midline Respiratory: Normal respiratory effort Breath sounds normal, no wheeze/rhonchi/rales Cardiovascular: S1/S2 normal Trace lower extremity edema RLE Gastrointestinal: Nontender, no masses but habitus limits exam.  Musculoskeletal:  No clubbing/cyanosis of digits Left BKA Neurological: No cranial nerve deficit on  limited exam Psychiatric: Normal judgment/insight Depressed/flat mood and affect       Scheduled Medications:   amiodarone  200 mg Oral Daily   apixaban  5 mg Oral BID   vitamin C  500 mg Oral Daily   aspirin EC  81 mg Oral Daily   budesonide (PULMICORT) nebulizer solution  0.5 mg Nebulization BID   chlorhexidine  15 mL Mouth Rinse BID   Chlorhexidine Gluconate Cloth  6 each Topical Daily   cholecalciferol  1,000 Units Oral Daily   diltiazem  120 mg Oral Q12H   ezetimibe  10 mg Oral Daily   ferrous sulfate  325 mg Oral Q breakfast   finasteride  5 mg Oral Daily   insulin aspart  0-20 Units Subcutaneous TID WC   insulin aspart  12 Units Subcutaneous TID WC   insulin glargine-yfgn  25 Units  Subcutaneous QHS   insulin glargine-yfgn  5 Units Subcutaneous Daily   ipratropium-albuterol  3 mL Nebulization BID   levothyroxine  25 mcg Oral Q0600   multivitamin with minerals  1 tablet Oral Daily   nystatin   Topical BID   primidone  250 mg Oral BID   Ensure Max Protein  11 oz Oral BID   risperiDONE  0.5 mg Oral BID   sodium chloride flush  10-40 mL Intracatheter Q12H   vitamin B-12  1,000 mcg Oral Daily    Continuous Infusions:  sodium chloride Stopped (10/25/21 2209)    PRN Medications:  acetaminophen **OR** acetaminophen, albuterol, metoprolol tartrate, nitroGLYCERIN, ondansetron **OR** ondansetron (ZOFRAN) IV, mouth rinse, QUEtiapine, sodium chloride flush  Antimicrobials:  Anti-infectives (From admission, onward)    Start     Dose/Rate Route Frequency Ordered Stop   10/26/21 2200  linezolid (ZYVOX) tablet 600 mg        600 mg Oral Every 12 hours 10/26/21 1743 11/01/21 2139   10/24/21 2200  linezolid (ZYVOX) IVPB 600 mg  Status:  Discontinued        600 mg 300 mL/hr over 60 Minutes Intravenous Every 12 hours 10/24/21 2157 10/26/21 1743   10/24/21 1800  ceFEPIme (MAXIPIME) 2 g in sodium chloride 0.9 % 100 mL IVPB        2 g 200 mL/hr over 30 Minutes Intravenous Every 8  hours 10/24/21 1612 10/26/21 1730   10/24/21 1030  sulfamethoxazole-trimethoprim (BACTRIM DS) 800-160 MG per tablet 1 tablet  Status:  Discontinued        1 tablet Oral Every 12 hours 10/24/21 0944 10/24/21 1608   10/23/21 2000  DAPTOmycin (CUBICIN) 800 mg in sodium chloride 0.9 % IVPB  Status:  Discontinued        6 mg/kg  129.2 kg (Adjusted) 132 mL/hr over 30 Minutes Intravenous Every 24 hours 10/23/21 0745 10/24/21 1803   10/22/21 0945  cefTRIAXone (ROCEPHIN) 1 g in sodium chloride 0.9 % 100 mL IVPB        1 g 200 mL/hr over 30 Minutes Intravenous Every 24 hours 10/22/21 0848 10/24/21 1845   10/04/21 1000  DAPTOmycin (CUBICIN) 800 mg in sodium chloride 0.9 % IVPB  Status:  Discontinued        6 mg/kg  129.2 kg (Adjusted) 132 mL/hr over 30 Minutes Intravenous Every 24 hours 10/03/21 1648 10/23/21 0745   10/03/21 0000  vancomycin (VANCOREADY) IVPB 1500 mg/300 mL        1,500 mg 150 mL/hr over 120 Minutes Intravenous Every 12 hours 10/02/21 1348 10/03/21 2359   09/30/21 1330  cefTRIAXone (ROCEPHIN) 2 g in sodium chloride 0.9 % 100 mL IVPB  Status:  Discontinued        2 g 200 mL/hr over 30 Minutes Intravenous Daily 09/30/21 1233 10/03/21 1849   09/30/21 1200  vancomycin (VANCOREADY) IVPB 1500 mg/300 mL  Status:  Discontinued        1,500 mg 150 mL/hr over 120 Minutes Intravenous Every 12 hours 09/30/21 0120 09/30/21 0927   09/30/21 1200  vancomycin (VANCOREADY) IVPB 1250 mg/250 mL  Status:  Discontinued        1,250 mg 166.7 mL/hr over 90 Minutes Intravenous Every 12 hours 09/30/21 0927 10/02/21 1348   09/30/21 0000  vancomycin (VANCOREADY) IVPB 1250 mg/250 mL       See Hyperspace for full Linked Orders Report.   1,250 mg 166.7 mL/hr over 90 Minutes Intravenous  Once 09/29/21 2308 09/30/21 0538  09/30/21 0000  vancomycin (VANCOREADY) IVPB 1250 mg/250 mL       See Hyperspace for full Linked Orders Report.   1,250 mg 166.7 mL/hr over 90 Minutes Intravenous  Once 09/29/21 2308 09/30/21  0255   09/29/21 0200  cefTRIAXone (ROCEPHIN) 2 g in sodium chloride 0.9 % 100 mL IVPB  Status:  Discontinued       See Hyperspace for full Linked Orders Report.   2 g 200 mL/hr over 30 Minutes Intravenous Every 24 hours 09/28/21 2051 09/29/21 2309   09/29/21 0200  metroNIDAZOLE (FLAGYL) IVPB 500 mg  Status:  Discontinued       See Hyperspace for full Linked Orders Report.   500 mg 100 mL/hr over 60 Minutes Intravenous Every 12 hours 09/28/21 2051 10/03/21 1849   09/28/21 1830  vancomycin (VANCOCIN) IVPB 1000 mg/200 mL premix        1,000 mg 200 mL/hr over 60 Minutes Intravenous  Once 09/28/21 1826 09/28/21 1937   09/28/21 1745  piperacillin-tazobactam (ZOSYN) IVPB 3.375 g        3.375 g 100 mL/hr over 30 Minutes Intravenous  Once 09/28/21 1736 09/28/21 1838       Data Reviewed: I have personally reviewed following labs and imaging studies  CBC: Recent Labs  Lab 11/05/21 0405 11/07/21 0352 11/10/21 0638  WBC 7.2 6.9 5.9  HGB 9.5* 9.7* 10.1*  HCT 32.0* 32.1* 33.0*  MCV 93.6 92.2 90.9  PLT 332 300 301   Basic Metabolic Panel: Recent Labs  Lab 11/05/21 0405 11/06/21 0415 11/07/21 0352 11/08/21 0512 11/09/21 0504 11/10/21 0638  NA 138 138 140  --  138 137  K 4.7 4.6 4.2  --  4.7 4.8  CL 99 101 103  --  103 103  CO2 33* 31 29  --  30 29  GLUCOSE 168* 192* 144*  --  124* 180*  BUN 32* 25* 22  --  23 28*  CREATININE 0.75 0.68 0.70  --  0.71 0.75  CALCIUM 8.3* 8.4* 8.6*  --  8.8* 8.8*  MG 1.9 2.0 2.1 2.0  --   --   PHOS 4.1 3.4 3.8 4.0  --   --    GFR: Estimated Creatinine Clearance: 156.2 mL/min (by C-G formula based on SCr of 0.75 mg/dL). Liver Function Tests: No results for input(s): "AST", "ALT", "ALKPHOS", "BILITOT", "PROT", "ALBUMIN" in the last 168 hours.  No results for input(s): "LIPASE", "AMYLASE" in the last 168 hours. No results for input(s): "AMMONIA" in the last 168 hours. Coagulation Profile: No results for input(s): "INR", "PROTIME" in the last 168  hours. Cardiac Enzymes: No results for input(s): "CKTOTAL", "CKMB", "CKMBINDEX", "TROPONINI" in the last 168 hours. BNP (last 3 results) No results for input(s): "PROBNP" in the last 8760 hours. HbA1C: No results for input(s): "HGBA1C" in the last 72 hours. CBG: Recent Labs  Lab 11/10/21 1120 11/10/21 1732 11/10/21 2158 11/11/21 0825 11/11/21 1157  GLUCAP 243* 169* 175* 164* 225*   Lipid Profile: No results for input(s): "CHOL", "HDL", "LDLCALC", "TRIG", "CHOLHDL", "LDLDIRECT" in the last 72 hours. Thyroid Function Tests: No results for input(s): "TSH", "T4TOTAL", "FREET4", "T3FREE", "THYROIDAB" in the last 72 hours. Anemia Panel: No results for input(s): "VITAMINB12", "FOLATE", "FERRITIN", "TIBC", "IRON", "RETICCTPCT" in the last 72 hours. Urine analysis:    Component Value Date/Time   COLORURINE STRAW (A) 11/06/2021 1900   APPEARANCEUR CLEAR (A) 11/06/2021 1900   LABSPEC 1.008 11/06/2021 1900   PHURINE 7.0 11/06/2021 1900   GLUCOSEU  NEGATIVE 11/06/2021 1900   HGBUR NEGATIVE 11/06/2021 1900   BILIRUBINUR NEGATIVE 11/06/2021 1900   KETONESUR NEGATIVE 11/06/2021 1900   PROTEINUR 100 (A) 11/06/2021 1900   NITRITE NEGATIVE 11/06/2021 1900   LEUKOCYTESUR LARGE (A) 11/06/2021 1900   Sepsis Labs: '@LABRCNTIP'$ (procalcitonin:4,lacticidven:4)  No results found for this or any previous visit (from the past 240 hour(s)).        Radiology Studies last 96 hours: No results found.          LOS: 44 days       Sidney Ace, DO [facility] 11/11/2021, 1:28 PM

## 2021-11-11 NOTE — Progress Notes (Addendum)
Physical Therapy Treatment Patient Details Name: [name]. MRN: [id number] DOB: [birth year] Today's Date: 11/11/2021   History of Present Illness [name] is a 58yoM who comes [facility] on 09/28/21 c progression of Left chornic heel ulcer. MRI showed significant progressive osteomyelitis in the left calcaneus. S/p L BKA 6/6. PT signed off 10/24/21 due to decline in respiratory status and transition to higher level of care due to increased demands for supplemental oxygen and high risk for deterioration and intubation.PMH HTN, IDDM2, morbid obesity, depression, CAD s/p CABG 2006 and PCI to RCA in 12/2020, chronic B venous stasis on [place] boots, chronic L foot ulcer, COPD, OSA on CPAP nightly, ai-fib, PAD, s/p multiple toe amputations.    PT Comments    Pt was long sitting in bed upon arriving. He is more visible fatigued today but still A and O x 4. He agrees to session however requested not to get OOB. Did perform Ther ex in bed and was able to tolerate application of BKA shrinker. Pt continues to be motivated to improve. Recommend DC to SNF to maximize independence while decreasing care giver burden.     Recommendations for follow up therapy are one component of a multi-disciplinary discharge planning process, led by the attending physician.  Recommendations may be updated based on patient status, additional functional criteria and insurance authorization.  Follow Up Recommendations  Skilled nursing-short term rehab (<3 hours/day)     Assistance Recommended at Discharge Frequent or constant Supervision/Assistance  Patient can return home with the following A lot of help with walking and/or transfers;Two people to help with bathing/dressing/bathroom;Assistance with cooking/housework;Direct supervision/assist for medications management;Assist for transportation;Help with stairs or ramp for entrance         Precautions / Restrictions Precautions Precautions: Fall Precaution Comments:  contact ISO Restrictions Weight Bearing Restrictions: Yes RLE Weight Bearing: Weight bearing as tolerated LLE Weight Bearing: Non weight bearing Other Position/Activity Restrictions: Lt BKA 10/03/21, (Shrinker delivered 7/13 & 7/14 with improved fit)     Mobility  Bed Mobility  General bed mobility comments: Elected to perform bed level ther ex only     Balance Overall balance assessment: Needs assistance Sitting-balance support: Bilateral upper extremity supported Sitting balance-Leahy Scale: Fair     Standing balance support: Bilateral upper extremity supported, Reliant on assistive device for balance Standing balance-Leahy Scale: Zero Standing balance comment: +2 max assistance required for standing       Cognition Arousal/Alertness: Awake/alert Behavior During Therapy: WFL for tasks assessed/performed Overall Cognitive Status: Within Functional Limits for tasks assessed    General Comments: Pt is A and O x 4. was able to correctly state and demonstrate LLE ROM/exercises previously issued. requested not to get OOB today 2/2 to fatigue (HR120s). He has been OOB ever other day this week.               Pertinent Vitals/Pain Pain Assessment Pain Assessment: 0-10 Pain Score: 3  Pain Location: LBP Pain Descriptors / Indicators: Grimacing Pain Intervention(s): Limited activity within patient's tolerance, Monitored during session, Premedicated before session, Repositioned     PT Goals (current goals can now be found in the care plan section) Acute Rehab PT Goals Patient Stated Goal: rehab then home " I just want to be able to use a regular toilet again one day." Progress towards PT goals: Progressing toward goals    Frequency    Min 2X/week      PT Plan Frequency needs to be updated (decrease to 2  x per week)    Co-evaluation     PT goals addressed during session: Mobility/safety with mobility;Balance;Proper use of DME;Strengthening/ROM        AM-PAC PT "6  Clicks" Mobility   Outcome Measure  Help needed turning from your back to your side while in a flat bed without using bedrails?: A Lot Help needed moving from lying on your back to sitting on the side of a flat bed without using bedrails?: A Lot Help needed moving to and from a bed to a chair (including a wheelchair)?: A Lot Help needed standing up from a chair using your arms (e.g., wheelchair or bedside chair)?: Total Help needed to walk in hospital room?: Total Help needed climbing 3-5 steps with a railing? : Total 6 Click Score: 9    End of Session Equipment Utilized During Treatment: Gait belt;Oxygen Activity Tolerance: Patient tolerated treatment well Patient left: in chair;with call bell/phone within reach;with chair alarm set Nurse Communication: Mobility status PT Visit Diagnosis: Difficulty in walking, not elsewhere classified (R26.2);Other abnormalities of gait and mobility (R26.89);Muscle weakness (generalized) (M62.81)     Time: 8101-7510 PT Time Calculation (min) (ACUTE ONLY): 18 min  Charges:  $Therapeutic Exercise: 8-22 mins                     Julaine Fusi PTA 11/11/21, 11:29 AM

## 2021-11-12 LAB — GLUCOSE, CAPILLARY
Glucose-Capillary: 181 mg/dL — ABNORMAL HIGH (ref 70–99)
Glucose-Capillary: 187 mg/dL — ABNORMAL HIGH (ref 70–99)
Glucose-Capillary: 137 mg/dL — ABNORMAL HIGH (ref 70–99)
Glucose-Capillary: 172 mg/dL — ABNORMAL HIGH (ref 70–99)

## 2021-11-12 NOTE — Progress Notes (Signed)
PROGRESS NOTE    [name].  GNF:[id number] DOB: 12/29/1955  DOA: 09/28/2021 Date of Service: 11/12/21 PCP: [name], MD     Hospital Course:  [name]. is a 66 y.o. male with medical history significant for CHF with last known LVEF of 50 to 55%, HTN, CAD, MDD, COPD, A-fib, morbid obesity (BMI 42), OSA, status post recent wound debridement and partial calcanectomy (05/16) on oral antibiotic therapy after completion of IV. He presented to the ER 09/28/2021 for bilateral lower extremity swelling, increased redness and swelling involving the left lower extremity mostly over the heel. Admitted 09/28/2021  06/1: Pt admitted to the [facility] unit with acute on chronic diastolic CHF and left heel osteomyelitis  06/2: Podiatry consulted for left foot osteomyelitis, plan for higher amputation.  Vascular surgery consulted  06/4: ID consulted for MRSA bacteremia likely source left heel wound  06/6: Pt underwent left BKA per vascular surgery  06/6 to 06/17: Pt with worsening acute on chronic hypoxic respiratory failure secondary to volume overload due to diastolic CHF.  CXR revealed new pleural effusion.  IV lasix resumed.  Requiring Bipap or [place]  06/21 to 06/27: mentation waxed and waned requiring Bipap during the day due to hypoxia  06/27: Pt requiring 100% HHFNC and transfer to the stepdown unit.  PCCM team consulted to assist with management  6/28: Pt tolerating [place] @ 50L/85%; Underwent Right-sided thoracentesis with removal of 1.2L of pleural fluid 6/29: Tolerating 45L/70% FiO2 with O2 sats 100%; remains on levophed gtt '@4'$  mcg/min to maintain map >65 6/30: altered mental status, but not from hypercapnia  7/1: Pt with intermittent agitation with worsening delirium that started 6/20.  Received prn seroquel and haldol overnight due to sleep deprivation.  This am pt calm with improvement in mentation  7/2: Pt lethargic after receiving prn haldol x1 dose and seroquel x1  dose.  Tolerating Bipap '@30'$ % FiO2.  If pt remains stable throughout the day will transfer service to [facility] starting 07/3 and PCCM team will sign off  7/3-7/4: supplemental oxygen and currently on bubbler at 10L. Can be transferred to PCU. Mental status has much improved today. PT/OT will likely need to come re-evaluate but will likely need LTAC vs SNF. CM is working on this. 7/5: still requiring 12L O2 this AM but --> 5L later in the day. 7/6: down to 4L [place] in the day, still on BiPap qhs/when asleep. PT/OT recommending SNF/LTAC, TOC working on LTAC but given O2 requirement have improved he may be able to go to SNF, TOC will look into this. Increased beta blocker given tachycardia. Cardiology also following.   7/7: potassium has been slightly elevated, worsening today. Restarted torsemide. D/w pharmacy re: risk QT prolongation w/ BB, amiodarone, antipsychotics - appreciate cardiology input: ok to d/c midodrine, restart diltiazem and d/c metoprolol, restart loop diuretic 7/8-7/9: . K is improved/down to WNL. Cr WNL, BUN trended up. Concern he is volume contracted, cardio d/c torsemide 7/8. Cardio recs: amiodarone 200 gm daily, diltiazem 90 q12 may need to increase, metoprolol held (was increased d/t HR, concern for high K and since holding this K has improved), diuresis as needed w/ torsemide which is held for now. HR remains elevated but otherwise he is approaching medical stability and TOC is working on placement.  7/10: Brief episode of confusion/hallucination which resolved spontaneously (patient states he was seeing finishes either outside the window or at his bedside table), ABG normal, UA obtained no UTI, remove Foley today  hopefully can leave this out if urinary retention has resolved 7/11-7/13: Tachycardic, asymptomatic.  Patient states his heart rate is "always in the 100s" discontinued telemetry and PICC line. We are still awaiting placement at LTAC/SNF. Diltiazem was increased by cardiology, diuresis  as needed and can likely d/c on torsemide 20 mg daily. Cardio has s/o and tele dc 07/13. Still awaiting placement.  07/14: bed confirmed at [facility] pending bariatric bed and BiPap set up, should be ok to d/c 07/17    Consultants:  Podiatry Vascular Surgery Infectious Disease Cardiology Palliative Care   Procedures: 10/03/21: left BKA per vascular surgery to treat Left osteomyelitis of the calcaneus with gangrene 10/25/21: R thoracentesis --> 1.2 L fluid   Subjective: No acute overnight events.  Patient feels fine     ASSESSMENT & PLAN:   Principal Problem:   Acute delirium Active Problems:   Sepsis due to methicillin resistant Staphylococcus aureus (MRSA) (HCC)   Acute on chronic respiratory failure with hypoxia and hypercapnia (HCC)   UTI (urinary tract infection)   Acute on chronic diastolic CHF (congestive heart failure) ([place])   Uncontrolled type 2 diabetes mellitus with hypoglycemia, with long-term current use of insulin (HCC)   AF (paroxysmal atrial fibrillation) (HCC)   Obesity, Class III, BMI 40-49.9 (morbid obesity) ([place])   Essential hypertension, benign   Osteomyelitis (HCC)   RLS (restless legs syndrome)   Acute urinary retention   Malnutrition of moderate degree   Generalized weakness   Acute respiratory failure with hypoxia and hypercapnia (HCC)   OSA (obstructive sleep apnea)   Hyperkalemia   Iron deficiency anemia   Pressure injury of skin of heel  Sepsis POA, resolved: due to methicillin resistant Staphylococcus aureus. RESOLVED  Blood cxs (+)MRSA. Repeat blood cx NGTD, Echo no vegetations on TTE, unable to to TEE. Likely secondary to osteomyelitis. Continue on linezolid (was on daptomycin but concern for eosinophilia so plan for linezolid until 11/01/21. Poor prognosis. High risk for intubation & cardiac arrest. See infectious disease notes 07/03: ID signed off    Acute hypoxic/hypercapnic  resp failure -secondary to combination of CHF,  hypoventilation syndrome, OSA , CO2 retention. IMPROVED/STABLE ON [place] O2 DAYTIME AND BIPAP QHS Ruled out amiodarone induced pneumonitis and dapto induced eosinophilic pneumonia.  BiPAP qhs / while asleep  Has been doing well on nasal cannula during the day   Acute on chronic diastolic CHF: EF 55 to 83% 04/2021 - RESOLVED/STABLE  Cardiology following. Has been relatively well-compensated past few days.  Cardio recs: amiodarone 200 gm daily, diltiazem 90 q12 may need to increase, metoprolol held (was increased d/t HR, concern for high K and since holding this K has improved), diuresis as needed w/ torsemide  Per cardiology will DC on torsemide 20 mg daily  Tachycardia and occasional elevated RR = SIRS, sepsis r/o HR remains elevated Diltiazem was increased by cardiology 07/12 Telemetry discontinued   Hyperkalemia - IMPROVED/RESOLVED potassium has been slightly elevated Stopped beta-blocker due to potential this may be contributing to hyperkalemia and K has improved since d/c this, started diltiazem, added back torsemide.  K has been WNL Monitor BMP every few days while inpatient   PAF:  HTN:  continue on eliquis, amio, diltiazem.   Restarted metoprolol given tachycardia and BP has been better however discontinued this due to concerns for mild but worsening hyperkalemia.  Placed on diltiazem. Cardio following.  Ideally would increase diltiazem but BP has been low Previously was on pressors but since have been weaned  off  UTI - RESOLVED:  urine cx (+)citrobacter. Completed abx course 3 days cefepime.     Acute delirium -RESOLVED:  labile mental status. Continue on risperidone. Seroquel qhs prn     DM2:  poorly controlled, HbA1c 10.2. Continue on glargine, aspart, SSI w/ accuchecks   Morbid obesity:  BMI 41.4. Complicates overall care & prognosis    Osteomyelitis of LLE: s/p L BLKA.  Completed linezolid 11/01/21 as per ID    RLS: holding home dose of mirapex   Acute urinary  retention: foley catheter placed on 10/08/2021.  Foley DC'd on 6/16.  Foley needed to be replaced on 10/24/2021   IDA: H&H had been labile. No need for a tranfusion    OSA: CPAP qhs. Qualifies for NIV with elevated PCO2 on ABG.    Generalized weakness: PT/OT will have to re-evaluate pt   Malnutrition of moderate degree: related to acute illness (left heel osteomyelitis) and energy intake of [place] than 75% for more than 7 days, mild fat depletion.  Continue on nutritional supplements           DVT prophylaxis: Eliquis Code Status: FULL Family Communication : pt declined call to family today Disposition Plan / TOC needs: SNF Barriers to discharge / significant pending items: SNF needs to arrange BIPap should be able to go Monday.  Unsafe discharge plan.  Plan discharge Monday 7/17             Objective: Vitals:   11/11/21 2020 11/12/21 0045 11/12/21 0518 11/12/21 0742  BP: 125/80 131/90 118/87 120/75  Pulse: (!) 117 (!) 115 (!) 114 (!) 116  Resp: '17 16  16  '$ Temp: 97.6 F (36.4 C) 97.6 F (36.4 C) 97.9 F (36.6 C) (!) 97.4 F (36.3 C)  TempSrc:  Axillary Oral   SpO2: 93% 100% 92% 93%  Weight:      Height:        Intake/Output Summary (Last 24 hours) at 11/12/2021 1029 Last data filed at 11/12/2021 1026 Gross per 24 hour  Intake --  Output 4100 ml  Net -4100 ml   Filed Weights   10/28/21 0500 11/03/21 0350 11/07/21 0451  Weight: (!) 154.4 kg (!) 167.8 kg (!) 173.7 kg    Examination:  Constitutional:  VS as above General Appearance: alert, well-developed, well-nourished, NAD Eyes: Normal lids and conjunctive, non-icteric sclera Neck: No masses, trachea midline Respiratory: Normal respiratory effort Breath sounds normal, no wheeze/rhonchi/rales Cardiovascular: S1/S2 normal Trace lower extremity edema RLE Gastrointestinal: Nontender, no masses but habitus limits exam.  Musculoskeletal:  No clubbing/cyanosis of digits Left BKA Neurological: No  cranial nerve deficit on limited exam Psychiatric: Normal judgment/insight Depressed/flat mood and affect       Scheduled Medications:   amiodarone  200 mg Oral Daily   apixaban  5 mg Oral BID   vitamin C  500 mg Oral Daily   aspirin EC  81 mg Oral Daily   budesonide (PULMICORT) nebulizer solution  0.5 mg Nebulization BID   chlorhexidine  15 mL Mouth Rinse BID   Chlorhexidine Gluconate Cloth  6 each Topical Daily   cholecalciferol  1,000 Units Oral Daily   diltiazem  120 mg Oral Q12H   ezetimibe  10 mg Oral Daily   ferrous sulfate  325 mg Oral Q breakfast   finasteride  5 mg Oral Daily   insulin aspart  0-20 Units Subcutaneous TID WC   insulin aspart  12 Units Subcutaneous TID WC   insulin glargine-yfgn  25  Units Subcutaneous QHS   insulin glargine-yfgn  5 Units Subcutaneous Daily   levothyroxine  25 mcg Oral Q0600   multivitamin with minerals  1 tablet Oral Daily   nystatin   Topical BID   primidone  250 mg Oral BID   Ensure Max Protein  11 oz Oral BID   risperiDONE  0.5 mg Oral BID   vitamin B-12  1,000 mcg Oral Daily    Continuous Infusions:  sodium chloride Stopped (10/25/21 2209)    PRN Medications:  acetaminophen **OR** acetaminophen, albuterol, metoprolol tartrate, nitroGLYCERIN, ondansetron **OR** ondansetron (ZOFRAN) IV, mouth rinse, QUEtiapine  Antimicrobials:  Anti-infectives (From admission, onward)    Start     Dose/Rate Route Frequency Ordered Stop   10/26/21 2200  linezolid (ZYVOX) tablet 600 mg        600 mg Oral Every 12 hours 10/26/21 1743 11/01/21 2139   10/24/21 2200  linezolid (ZYVOX) IVPB 600 mg  Status:  Discontinued        600 mg 300 mL/hr over 60 Minutes Intravenous Every 12 hours 10/24/21 2157 10/26/21 1743   10/24/21 1800  ceFEPIme (MAXIPIME) 2 g in sodium chloride 0.9 % 100 mL IVPB        2 g 200 mL/hr over 30 Minutes Intravenous Every 8 hours 10/24/21 1612 10/26/21 1730   10/24/21 1030  sulfamethoxazole-trimethoprim (BACTRIM DS)  800-160 MG per tablet 1 tablet  Status:  Discontinued        1 tablet Oral Every 12 hours 10/24/21 0944 10/24/21 1608   10/23/21 2000  DAPTOmycin (CUBICIN) 800 mg in sodium chloride 0.9 % IVPB  Status:  Discontinued        6 mg/kg  129.2 kg (Adjusted) 132 mL/hr over 30 Minutes Intravenous Every 24 hours 10/23/21 0745 10/24/21 1803   10/22/21 0945  cefTRIAXone (ROCEPHIN) 1 g in sodium chloride 0.9 % 100 mL IVPB        1 g 200 mL/hr over 30 Minutes Intravenous Every 24 hours 10/22/21 0848 10/24/21 1845   10/04/21 1000  DAPTOmycin (CUBICIN) 800 mg in sodium chloride 0.9 % IVPB  Status:  Discontinued        6 mg/kg  129.2 kg (Adjusted) 132 mL/hr over 30 Minutes Intravenous Every 24 hours 10/03/21 1648 10/23/21 0745   10/03/21 0000  vancomycin (VANCOREADY) IVPB 1500 mg/300 mL        1,500 mg 150 mL/hr over 120 Minutes Intravenous Every 12 hours 10/02/21 1348 10/03/21 2359   09/30/21 1330  cefTRIAXone (ROCEPHIN) 2 g in sodium chloride 0.9 % 100 mL IVPB  Status:  Discontinued        2 g 200 mL/hr over 30 Minutes Intravenous Daily 09/30/21 1233 10/03/21 1849   09/30/21 1200  vancomycin (VANCOREADY) IVPB 1500 mg/300 mL  Status:  Discontinued        1,500 mg 150 mL/hr over 120 Minutes Intravenous Every 12 hours 09/30/21 0120 09/30/21 0927   09/30/21 1200  vancomycin (VANCOREADY) IVPB 1250 mg/250 mL  Status:  Discontinued        1,250 mg 166.7 mL/hr over 90 Minutes Intravenous Every 12 hours 09/30/21 0927 10/02/21 1348   09/30/21 0000  vancomycin (VANCOREADY) IVPB 1250 mg/250 mL       See Hyperspace for full Linked Orders Report.   1,250 mg 166.7 mL/hr over 90 Minutes Intravenous  Once 09/29/21 2308 09/30/21 0538   09/30/21 0000  vancomycin (VANCOREADY) IVPB 1250 mg/250 mL       See Hyperspace for full  Linked Orders Report.   1,250 mg 166.7 mL/hr over 90 Minutes Intravenous  Once 09/29/21 2308 09/30/21 0255   09/29/21 0200  cefTRIAXone (ROCEPHIN) 2 g in sodium chloride 0.9 % 100 mL IVPB   Status:  Discontinued       See Hyperspace for full Linked Orders Report.   2 g 200 mL/hr over 30 Minutes Intravenous Every 24 hours 09/28/21 2051 09/29/21 2309   09/29/21 0200  metroNIDAZOLE (FLAGYL) IVPB 500 mg  Status:  Discontinued       See Hyperspace for full Linked Orders Report.   500 mg 100 mL/hr over 60 Minutes Intravenous Every 12 hours 09/28/21 2051 10/03/21 1849   09/28/21 1830  vancomycin (VANCOCIN) IVPB 1000 mg/200 mL premix        1,000 mg 200 mL/hr over 60 Minutes Intravenous  Once 09/28/21 1826 09/28/21 1937   09/28/21 1745  piperacillin-tazobactam (ZOSYN) IVPB 3.375 g        3.375 g 100 mL/hr over 30 Minutes Intravenous  Once 09/28/21 1736 09/28/21 1838       Data Reviewed: I have personally reviewed following labs and imaging studies  CBC: Recent Labs  Lab 11/07/21 0352 11/10/21 0638  WBC 6.9 5.9  HGB 9.7* 10.1*  HCT 32.1* 33.0*  MCV 92.2 90.9  PLT 300 626   Basic Metabolic Panel: Recent Labs  Lab 11/06/21 0415 11/07/21 0352 11/08/21 0512 11/09/21 0504 11/10/21 0638  NA 138 140  --  138 137  K 4.6 4.2  --  4.7 4.8  CL 101 103  --  103 103  CO2 31 29  --  30 29  GLUCOSE 192* 144*  --  124* 180*  BUN 25* 22  --  23 28*  CREATININE 0.68 0.70  --  0.71 0.75  CALCIUM 8.4* 8.6*  --  8.8* 8.8*  MG 2.0 2.1 2.0  --   --   PHOS 3.4 3.8 4.0  --   --    GFR: Estimated Creatinine Clearance: 156.2 mL/min (by C-G formula based on SCr of 0.75 mg/dL). Liver Function Tests: No results for input(s): "AST", "ALT", "ALKPHOS", "BILITOT", "PROT", "ALBUMIN" in the last 168 hours.  No results for input(s): "LIPASE", "AMYLASE" in the last 168 hours. No results for input(s): "AMMONIA" in the last 168 hours. Coagulation Profile: No results for input(s): "INR", "PROTIME" in the last 168 hours. Cardiac Enzymes: No results for input(s): "CKTOTAL", "CKMB", "CKMBINDEX", "TROPONINI" in the last 168 hours. BNP (last 3 results) No results for input(s): "PROBNP" in the  last 8760 hours. HbA1C: No results for input(s): "HGBA1C" in the last 72 hours. CBG: Recent Labs  Lab 11/11/21 0825 11/11/21 1157 11/11/21 1714 11/11/21 2225 11/12/21 0806  GLUCAP 164* 225* 178* 159* 181*   Lipid Profile: No results for input(s): "CHOL", "HDL", "LDLCALC", "TRIG", "CHOLHDL", "LDLDIRECT" in the last 72 hours. Thyroid Function Tests: No results for input(s): "TSH", "T4TOTAL", "FREET4", "T3FREE", "THYROIDAB" in the last 72 hours. Anemia Panel: No results for input(s): "VITAMINB12", "FOLATE", "FERRITIN", "TIBC", "IRON", "RETICCTPCT" in the last 72 hours. Urine analysis:    Component Value Date/Time   COLORURINE STRAW (A) 11/06/2021 1900   APPEARANCEUR CLEAR (A) 11/06/2021 1900   LABSPEC 1.008 11/06/2021 1900   PHURINE 7.0 11/06/2021 1900   GLUCOSEU NEGATIVE 11/06/2021 1900   HGBUR NEGATIVE 11/06/2021 1900   BILIRUBINUR NEGATIVE 11/06/2021 1900   KETONESUR NEGATIVE 11/06/2021 1900   PROTEINUR 100 (A) 11/06/2021 1900   NITRITE NEGATIVE 11/06/2021 1900   LEUKOCYTESUR LARGE (  A) 11/06/2021 1900   Sepsis Labs: '@LABRCNTIP'$ (procalcitonin:4,lacticidven:4)  No results found for this or any previous visit (from the past 240 hour(s)).        Radiology Studies last 96 hours: No results found.          LOS: 45 days       Sidney Ace, DO [facility] 11/12/2021, 10:29 AM

## 2021-11-12 NOTE — Plan of Care (Signed)
  Problem: Nutrition: Goal: Adequate nutrition will be maintained Outcome: Progressing   Problem: Elimination: Goal: Will not experience complications related to bowel motility Outcome: Progressing   Problem: Pain Managment: Goal: General experience of comfort will improve Outcome: Progressing   

## 2021-11-12 NOTE — Plan of Care (Signed)

## 2021-11-13 ENCOUNTER — Ambulatory Visit (INDEPENDENT_AMBULATORY_CARE_PROVIDER_SITE_OTHER): Payer: Medicare Other | Admitting: Vascular Surgery

## 2021-11-13 ENCOUNTER — Encounter (INDEPENDENT_AMBULATORY_CARE_PROVIDER_SITE_OTHER): Payer: Medicare Other

## 2021-11-13 DIAGNOSIS — I480 Paroxysmal atrial fibrillation: Secondary | ICD-10-CM | POA: Diagnosis not present

## 2021-11-13 DIAGNOSIS — F32A Depression, unspecified: Secondary | ICD-10-CM | POA: Diagnosis not present

## 2021-11-13 DIAGNOSIS — R410 Disorientation, unspecified: Secondary | ICD-10-CM | POA: Diagnosis not present

## 2021-11-13 DIAGNOSIS — E669 Obesity, unspecified: Secondary | ICD-10-CM | POA: Diagnosis not present

## 2021-11-13 DIAGNOSIS — G4730 Sleep apnea, unspecified: Secondary | ICD-10-CM | POA: Diagnosis not present

## 2021-11-13 DIAGNOSIS — R000 Tachycardia, unspecified: Secondary | ICD-10-CM | POA: Diagnosis not present

## 2021-11-13 DIAGNOSIS — Z89512 Acquired absence of left leg below knee: Secondary | ICD-10-CM | POA: Diagnosis not present

## 2021-11-13 DIAGNOSIS — I10 Essential (primary) hypertension: Secondary | ICD-10-CM | POA: Diagnosis not present

## 2021-11-13 DIAGNOSIS — R5383 Other fatigue: Secondary | ICD-10-CM | POA: Diagnosis not present

## 2021-11-13 DIAGNOSIS — I110 Hypertensive heart disease with heart failure: Secondary | ICD-10-CM | POA: Diagnosis not present

## 2021-11-13 DIAGNOSIS — I5033 Acute on chronic diastolic (congestive) heart failure: Secondary | ICD-10-CM | POA: Diagnosis not present

## 2021-11-13 DIAGNOSIS — U071 COVID-19: Secondary | ICD-10-CM | POA: Diagnosis not present

## 2021-11-13 DIAGNOSIS — R5381 Other malaise: Secondary | ICD-10-CM | POA: Diagnosis not present

## 2021-11-13 DIAGNOSIS — J9601 Acute respiratory failure with hypoxia: Secondary | ICD-10-CM | POA: Diagnosis not present

## 2021-11-13 DIAGNOSIS — I5032 Chronic diastolic (congestive) heart failure: Secondary | ICD-10-CM | POA: Diagnosis not present

## 2021-11-13 DIAGNOSIS — I2510 Atherosclerotic heart disease of native coronary artery without angina pectoris: Secondary | ICD-10-CM | POA: Diagnosis not present

## 2021-11-13 DIAGNOSIS — Z9981 Dependence on supplemental oxygen: Secondary | ICD-10-CM | POA: Diagnosis not present

## 2021-11-13 DIAGNOSIS — E1142 Type 2 diabetes mellitus with diabetic polyneuropathy: Secondary | ICD-10-CM | POA: Diagnosis not present

## 2021-11-13 DIAGNOSIS — M545 Low back pain, unspecified: Secondary | ICD-10-CM | POA: Diagnosis not present

## 2021-11-13 DIAGNOSIS — R635 Abnormal weight gain: Secondary | ICD-10-CM | POA: Diagnosis not present

## 2021-11-13 DIAGNOSIS — M6281 Muscle weakness (generalized): Secondary | ICD-10-CM | POA: Diagnosis not present

## 2021-11-13 DIAGNOSIS — L299 Pruritus, unspecified: Secondary | ICD-10-CM | POA: Diagnosis not present

## 2021-11-13 DIAGNOSIS — J189 Pneumonia, unspecified organism: Secondary | ICD-10-CM | POA: Diagnosis not present

## 2021-11-13 DIAGNOSIS — I890 Lymphedema, not elsewhere classified: Secondary | ICD-10-CM | POA: Diagnosis not present

## 2021-11-13 DIAGNOSIS — R52 Pain, unspecified: Secondary | ICD-10-CM | POA: Diagnosis not present

## 2021-11-13 DIAGNOSIS — E1151 Type 2 diabetes mellitus with diabetic peripheral angiopathy without gangrene: Secondary | ICD-10-CM | POA: Diagnosis not present

## 2021-11-13 DIAGNOSIS — I4891 Unspecified atrial fibrillation: Secondary | ICD-10-CM | POA: Diagnosis not present

## 2021-11-13 DIAGNOSIS — R41841 Cognitive communication deficit: Secondary | ICD-10-CM | POA: Diagnosis not present

## 2021-11-13 DIAGNOSIS — J449 Chronic obstructive pulmonary disease, unspecified: Secondary | ICD-10-CM | POA: Diagnosis not present

## 2021-11-13 DIAGNOSIS — E11649 Type 2 diabetes mellitus with hypoglycemia without coma: Secondary | ICD-10-CM | POA: Diagnosis not present

## 2021-11-13 DIAGNOSIS — G4733 Obstructive sleep apnea (adult) (pediatric): Secondary | ICD-10-CM | POA: Diagnosis not present

## 2021-11-13 DIAGNOSIS — I517 Cardiomegaly: Secondary | ICD-10-CM | POA: Diagnosis not present

## 2021-11-13 DIAGNOSIS — R197 Diarrhea, unspecified: Secondary | ICD-10-CM | POA: Diagnosis not present

## 2021-11-13 DIAGNOSIS — E1169 Type 2 diabetes mellitus with other specified complication: Secondary | ICD-10-CM | POA: Diagnosis not present

## 2021-11-13 DIAGNOSIS — F329 Major depressive disorder, single episode, unspecified: Secondary | ICD-10-CM | POA: Diagnosis not present

## 2021-11-13 DIAGNOSIS — Z6841 Body Mass Index (BMI) 40.0 and over, adult: Secondary | ICD-10-CM | POA: Diagnosis not present

## 2021-11-13 DIAGNOSIS — Z91199 Patient's noncompliance with other medical treatment and regimen due to unspecified reason: Secondary | ICD-10-CM | POA: Diagnosis not present

## 2021-11-13 DIAGNOSIS — E1159 Type 2 diabetes mellitus with other circulatory complications: Secondary | ICD-10-CM | POA: Diagnosis not present

## 2021-11-13 DIAGNOSIS — E440 Moderate protein-calorie malnutrition: Secondary | ICD-10-CM | POA: Diagnosis not present

## 2021-11-13 DIAGNOSIS — Z23 Encounter for immunization: Secondary | ICD-10-CM | POA: Diagnosis not present

## 2021-11-13 DIAGNOSIS — Z794 Long term (current) use of insulin: Secondary | ICD-10-CM | POA: Diagnosis not present

## 2021-11-13 DIAGNOSIS — Z4781 Encounter for orthopedic aftercare following surgical amputation: Secondary | ICD-10-CM | POA: Diagnosis not present

## 2021-11-13 DIAGNOSIS — M25561 Pain in right knee: Secondary | ICD-10-CM | POA: Diagnosis not present

## 2021-11-13 DIAGNOSIS — Z89421 Acquired absence of other right toe(s): Secondary | ICD-10-CM | POA: Diagnosis not present

## 2021-11-13 DIAGNOSIS — E119 Type 2 diabetes mellitus without complications: Secondary | ICD-10-CM | POA: Diagnosis not present

## 2021-11-13 DIAGNOSIS — E785 Hyperlipidemia, unspecified: Secondary | ICD-10-CM | POA: Diagnosis not present

## 2021-11-13 DIAGNOSIS — E039 Hypothyroidism, unspecified: Secondary | ICD-10-CM | POA: Diagnosis not present

## 2021-11-13 LAB — CBC
HCT: 34.6 % — ABNORMAL LOW (ref 39.0–52.0)
Hemoglobin: 10.7 g/dL — ABNORMAL LOW (ref 13.0–17.0)
MCH: 27.9 pg (ref 26.0–34.0)
MCHC: 30.9 g/dL (ref 30.0–36.0)
MCV: 90.1 fL (ref 80.0–100.0)
Platelets: 223 10*3/uL (ref 150–400)
RBC: 3.84 MIL/uL — ABNORMAL LOW (ref 4.22–5.81)
RDW: 16.3 % — ABNORMAL HIGH (ref 11.5–15.5)
WBC: 6.7 10*3/uL (ref 4.0–10.5)
nRBC: 0 % (ref 0.0–0.2)

## 2021-11-13 LAB — GLUCOSE, CAPILLARY
Glucose-Capillary: 138 mg/dL — ABNORMAL HIGH (ref 70–99)
Glucose-Capillary: 240 mg/dL — ABNORMAL HIGH (ref 70–99)
Glucose-Capillary: 213 mg/dL — ABNORMAL HIGH (ref 70–99)

## 2021-11-13 NOTE — Plan of Care (Signed)
°  Problem: Education: °Goal: Knowledge of General Education information will improve °Description: Including pain rating scale, medication(s)/side effects and non-pharmacologic comfort measures °Outcome: Progressing °  °Problem: Health Behavior/Discharge Planning: °Goal: Ability to manage health-related needs will improve °Outcome: Progressing °  °Problem: Clinical Measurements: °Goal: Ability to maintain clinical measurements within normal limits will improve °Outcome: Progressing °Goal: Will remain free from infection °Outcome: Progressing °Goal: Diagnostic test results will improve °Outcome: Progressing °Goal: Respiratory complications will improve °Outcome: Progressing °Goal: Cardiovascular complication will be avoided °Outcome: Progressing °  °Problem: Nutrition: °Goal: Adequate nutrition will be maintained °Outcome: Progressing °  °Problem: Coping: °Goal: Level of anxiety will decrease °Outcome: Progressing °  °Problem: Elimination: °Goal: Will not experience complications related to bowel motility °Outcome: Progressing °Goal: Will not experience complications related to urinary retention °Outcome: Progressing °  °Problem: Pain Managment: °Goal: General experience of comfort will improve °Outcome: Progressing °  °Problem: Safety: °Goal: Ability to remain free from injury will improve °Outcome: Progressing °  °Problem: Skin Integrity: °Goal: Risk for impaired skin integrity will decrease °Outcome: Progressing °  °Problem: Activity: °Goal: Risk for activity intolerance will decrease °Outcome: Not Progressing °  °

## 2021-11-13 NOTE — Discharge Summary (Signed)
Physician Discharge Summary  [name]. YIR:[id number] DOB: 1955/10/06 DOA: 09/28/2021  PCP: [name], MD  Admit date: 09/28/2021 Discharge date: 11/13/2021  Admitted From: Home Disposition:  SNF  Recommendations for Outpatient Follow-up:  Follow up with PCP in 1-2 weeks   Home Health:No Equipment/Devices:Oxygen 2L [place]   Discharge Condition:Stable  CODE STATUS:FULL  Diet recommendation: Heart healthy  Brief/Interim Summary: [name]. is a 66 y.o. male with medical history significant for CHF with last known LVEF of 50 to 55%, HTN, CAD, MDD, COPD, A-fib, morbid obesity (BMI 55), OSA, status post recent wound debridement and partial calcanectomy (05/16) on oral antibiotic therapy after completion of IV. He presented to the ER 09/28/2021 for bilateral lower extremity swelling, increased redness and swelling involving the left lower extremity mostly over the heel. Admitted 09/28/2021  06/1: Pt admitted to the [facility] unit with acute on chronic diastolic CHF and left heel osteomyelitis  06/2: Podiatry consulted for left foot osteomyelitis, plan for higher amputation.  Vascular surgery consulted  06/4: ID consulted for MRSA bacteremia likely source left heel wound  06/6: Pt underwent left BKA per vascular surgery  06/6 to 06/17: Pt with worsening acute on chronic hypoxic respiratory failure secondary to volume overload due to diastolic CHF.  CXR revealed new pleural effusion.  IV lasix resumed.  Requiring Bipap or Castine  06/21 to 06/27: mentation waxed and waned requiring Bipap during the day due to hypoxia  06/27: Pt requiring 100% HHFNC and transfer to the stepdown unit.  PCCM team consulted to assist with management  6/28: Pt tolerating [place] @ 50L/85%; Underwent Right-sided thoracentesis with removal of 1.2L of pleural fluid 6/29: Tolerating 45L/70% FiO2 with O2 sats 100%; remains on levophed gtt '@4'$  mcg/min to maintain map >65 6/30: altered mental status, but  not from hypercapnia  7/1: Pt with intermittent agitation with worsening delirium that started 6/20.  Received prn seroquel and haldol overnight due to sleep deprivation.  This am pt calm with improvement in mentation  7/2: Pt lethargic after receiving prn haldol x1 dose and seroquel x1 dose.  Tolerating Bipap '@30'$ % FiO2.  If pt remains stable throughout the day will transfer service to [facility] starting 07/3 and PCCM team will sign off  7/3-7/4: supplemental oxygen and currently on bubbler at 10L. Can be transferred to PCU. Mental status has much improved today. PT/OT will likely need to come re-evaluate but will likely need LTAC vs SNF. CM is working on this. 7/5: still requiring 12L O2 this AM but --> 5L later in the day. 7/6: down to 4L [place] in the day, still on BiPap qhs/when asleep. PT/OT recommending SNF/LTAC, TOC working on LTAC but given O2 requirement have improved he may be able to go to SNF, TOC will look into this. Increased beta blocker given tachycardia. Cardiology also following.   7/7: potassium has been slightly elevated, worsening today. Restarted torsemide. D/w pharmacy re: risk QT prolongation w/ BB, amiodarone, antipsychotics - appreciate cardiology input: ok to d/c midodrine, restart diltiazem and d/c metoprolol, restart loop diuretic 7/8-7/9: . K is improved/down to WNL. Cr WNL, BUN trended up. Concern he is volume contracted, cardio d/c torsemide 7/8. Cardio recs: amiodarone 200 gm daily, diltiazem 90 q12 may need to increase, metoprolol held (was increased d/t HR, concern for high K and since holding this K has improved), diuresis as needed w/ torsemide which is held for now. HR remains elevated but otherwise he is approaching medical stability and TOC is  working on placement.  7/10: Brief episode of confusion/hallucination which resolved spontaneously (patient states he was seeing finishes either outside the window or at his bedside table), ABG normal, UA obtained no UTI, remove Foley  today hopefully can leave this out if urinary retention has resolved 7/11-7/13: Tachycardic, asymptomatic.  Patient states his heart rate is "always in the 100s" discontinued telemetry and PICC line. We are still awaiting placement at LTAC/SNF. Diltiazem was increased by cardiology, diuresis as needed and can likely d/c on torsemide 20 mg daily. Cardio has s/o and tele dc 07/13. Still awaiting placement.  07/14: bed confirmed at [facility] pending bariatric bed and BiPap set up, should be ok to d/c 07/17 7/17; DC to SNF    Discharge Diagnoses:  Principal Problem:   Acute delirium Active Problems:   Sepsis due to methicillin resistant Staphylococcus aureus (MRSA) ([place])   Acute on chronic respiratory failure with hypoxia and hypercapnia (HCC)   UTI (urinary tract infection)   Acute on chronic diastolic CHF (congestive heart failure) ([place])   Uncontrolled type 2 diabetes mellitus with hypoglycemia, with long-term current use of insulin (HCC)   AF (paroxysmal atrial fibrillation) (HCC)   Obesity, Class III, BMI 40-49.9 (morbid obesity) ([place])   Essential hypertension, benign   Osteomyelitis (HCC)   RLS (restless legs syndrome)   Acute urinary retention   Malnutrition of moderate degree   Generalized weakness   Acute respiratory failure with hypoxia and hypercapnia (HCC)   OSA (obstructive sleep apnea)   Hyperkalemia   Iron deficiency anemia   Pressure injury of skin of heel  Sepsis POA, resolved: due to methicillin resistant Staphylococcus aureus. RESOLVED  Blood cxs (+)MRSA. Repeat blood cx NGTD, Echo no vegetations on TTE, unable to to TEE. Likely secondary to osteomyelitis. Continue on linezolid (was on daptomycin but concern for eosinophilia so plan for linezolid until 11/01/21. Poor prognosis. High risk for intubation & cardiac arrest. See infectious disease notes 07/03: ID signed off    Acute hypoxic/hypercapnic  resp failure -secondary to combination of CHF, hypoventilation  syndrome, OSA , CO2 retention. IMPROVED/STABLE ON [place] O2 DAYTIME AND BIPAP QHS Ruled out amiodarone induced pneumonitis and dapto induced eosinophilic pneumonia.  BiPAP qhs / while asleep  Has been doing well on nasal cannula during the day   Acute on chronic diastolic CHF: EF 55 to 92% 04/2021 - RESOLVED/STABLE  Cardiology following. Has been relatively well-compensated past few days.  Cardio recs: amiodarone 200 gm daily, diltiazem 90 q12 may need to increase, metoprolol held (was increased d/t HR, concern for high K and since holding this K has improved), diuresis as needed w/ torsemide  Per cardiology will DC on torsemide 20 mg daily   Tachycardia and occasional elevated RR = SIRS, sepsis r/o HR remains elevated Diltiazem was increased by cardiology 07/12 Telemetry discontinued    Hyperkalemia - IMPROVED/RESOLVED potassium has been slightly elevated Stopped beta-blocker due to potential this may be contributing to hyperkalemia and K has improved since d/c this, started diltiazem, added back torsemide.  K has been WNL Monitor BMP every few days while inpatient    PAF:  HTN:  continue on eliquis, amio, diltiazem.   Restarted metoprolol given tachycardia and BP has been better however discontinued this due to concerns for mild but worsening hyperkalemia.  Placed on diltiazem. Cardio following.  Ideally would increase diltiazem but BP has been low Previously was on pressors but since have been weaned off   UTI - RESOLVED:  urine cx (+)  citrobacter. Completed abx course 3 days cefepime.     Acute delirium -RESOLVED:  labile mental status. Continue on risperidone. Seroquel qhs prn     DM2:  poorly controlled, HbA1c 10.2. Continue on glargine, aspart, SSI w/ accuchecks   Morbid obesity:  BMI 41.4. Complicates overall care & prognosis    Osteomyelitis of LLE: s/p L BLKA.  Completed linezolid 11/01/21 as per ID    RLS: holding home dose of mirapex   Acute urinary retention: foley  catheter placed on 10/08/2021.  Foley DC'd on 6/16.  Foley needed to be replaced on 10/24/2021   IDA: H&H had been labile. No need for a tranfusion    OSA: CPAP qhs. Qualifies for NIV with elevated PCO2 on ABG.    Generalized weakness: PT/OT will have to re-evaluate pt   Malnutrition of moderate degree: related to acute illness (left heel osteomyelitis) and energy intake of Wood than 75% for more than 7 days, mild fat depletion.  Continue on nutritional supplements   Discharge Instructions  Discharge Instructions     Diet - low sodium heart healthy   Complete by: As directed    Increase activity slowly   Complete by: As directed    No wound care   Complete by: As directed       Allergies as of 11/13/2021       Reactions   Penicillins Other (See Comments)   Happened at 66 years old and pt. stated he passed out  He has tolerated amoxicillin/clavulanate and ampicillin/sulbactam   Statins    Other reaction(s): Muscle Pain Causes legs to ache per pt        Medication List     STOP taking these medications    amoxicillin-clavulanate 875-125 MG tablet Commonly known as: Augmentin   diltiazem 300 MG 24 hr capsule Commonly known as: CARDIZEM CD   FreeStyle Libre 2 Sensor Misc   gabapentin 300 MG capsule Commonly known as: NEURONTIN   HumaLOG 100 UNIT/ML injection Generic drug: insulin lispro   insulin glargine 100 UNIT/ML Solostar Pen Commonly known as: LANTUS Replaced by: insulin glargine-yfgn 100 UNIT/ML injection   isosorbide mononitrate 60 MG 24 hr tablet Commonly known as: IMDUR   losartan 50 MG tablet Commonly known as: COZAAR   metoprolol tartrate 50 MG tablet Commonly known as: LOPRESSOR   pramipexole 1 MG tablet Commonly known as: MIRAPEX   torsemide 20 MG tablet Commonly known as: DEMADEX   zinc gluconate 50 MG tablet       TAKE these medications    acetaminophen 325 MG tablet Commonly known as: TYLENOL Take 2 tablets (650 mg total) by  mouth every 6 (six) hours as needed for mild pain (or Fever >/= 101).   amiodarone 200 MG tablet Commonly known as: PACERONE Take 200 mg by mouth daily.   apixaban 5 MG Tabs tablet Commonly known as: ELIQUIS Take 1 tablet (5 mg total) by mouth 2 (two) times daily.   aspirin EC 81 MG tablet Take 1 tablet (81 mg total) by mouth daily. Swallow whole.   budesonide 0.5 MG/2ML nebulizer solution Commonly known as: PULMICORT Take 2 mLs (0.5 mg total) by nebulization 2 (two) times daily.   Cholecalciferol 25 MCG (1000 UT) tablet Take 1,000 Units by mouth daily.   diltiazem 120 MG 12 hr capsule Commonly known as: CARDIZEM SR Take 1 capsule (120 mg total) by mouth every 12 (twelve) hours.   Ensure Max Protein Liqd Take 330 mLs (11 oz total) by  mouth 2 (two) times daily.   ezetimibe 10 MG tablet Commonly known as: ZETIA Take 10 mg by mouth at bedtime.   ferrous sulfate 325 (65 FE) MG tablet Take 325 mg by mouth daily with breakfast.   finasteride 5 MG tablet Commonly known as: PROSCAR Take 1 tablet (5 mg total) by mouth daily.   insulin aspart 100 UNIT/ML injection Commonly known as: novoLOG Inject 12 Units into the skin 3 (three) times daily with meals.   insulin aspart 100 UNIT/ML injection Commonly known as: novoLOG Inject 0-20 Units into the skin 3 (three) times daily with meals.   insulin glargine-yfgn 100 UNIT/ML injection Commonly known as: SEMGLEE Inject 0.25 mLs (25 Units total) into the skin at bedtime. Replaces: insulin glargine 100 UNIT/ML Solostar Pen   levothyroxine 25 MCG tablet Commonly known as: SYNTHROID Take 1 tablet (25 mcg total) by mouth daily at 6 (six) AM.   metFORMIN 1000 MG tablet Commonly known as: GLUCOPHAGE Take 1,000 mg by mouth 2 (two) times daily.   multivitamin with minerals tablet Take 1 tablet by mouth daily.   nitroGLYCERIN 0.4 MG SL tablet Commonly known as: NITROSTAT Place 0.4 mg under the tongue every 5 (five) minutes as  needed for chest pain.   nystatin powder Commonly known as: MYCOSTATIN/NYSTOP Apply topically 2 (two) times daily.   primidone 250 MG tablet Commonly known as: MYSOLINE Take 250 mg by mouth 2 (two) times daily.   risperiDONE 0.5 MG tablet Commonly known as: RISPERDAL Take 1 tablet (0.5 mg total) by mouth 2 (two) times daily.   tamsulosin 0.4 MG Caps capsule Commonly known as: FLOMAX Take 0.4 mg by mouth daily.   traMADol 50 MG tablet Commonly known as: ULTRAM Take 1 tablet (50 mg total) by mouth daily as needed for moderate pain.   Vitamin B-12 5000 MCG Tbdp Take 10,000 mcg by mouth daily.   vitamin C 500 MG tablet Commonly known as: ASCORBIC ACID Take 500 mg by mouth daily.        Follow-up Information     Teodoro Spray, MD. Go in 3 week(s).   Specialty: Cardiology Contact information: [place] [id number] [phone]                Allergies  Allergen Reactions   Penicillins Other (See Comments)    Happened at 66 years old and pt. stated he passed out  He has tolerated amoxicillin/clavulanate and ampicillin/sulbactam   Statins     Other reaction(s): Muscle Pain Causes legs to ache per pt    Consultations: Palliative care Psychiatry PCCM ID Vascular surgery Podiatry Cardiology   Procedures/Studies: DG Knee Right Port  Result Date: 11/04/2021 CLINICAL DATA:  [place] out of bed, evaluate for fracture EXAM: PORTABLE RIGHT KNEE - 1-2 VIEW COMPARISON:  None Available. FINDINGS: There is no evidence of acute fracture. Alignment is normal. There is mild tricompartment osteoarthritis. There is a trace joint effusion. There is mild prepatellar soft tissue swelling. There is a vascular stent noted posteriorly. There is chronic appearing cortical thickening of the tibial diaphysis, partially visualized. IMPRESSION: No evidence of acute fracture. Mild prepatellar soft tissue swelling. Mild tricompartment osteoarthritis.  Trace joint effusion.  Electronically Signed   By: Maurine Simmering M.D.   On: 11/04/2021 08:42   DG Knee Left Port  Result Date: 11/04/2021 CLINICAL DATA:  Fall from bed, evaluate for fracture EXAM: PORTABLE LEFT KNEE - 1-2 VIEW COMPARISON:  None Available. FINDINGS: Below the knee amputation.  Subcutaneous reticulation is generalized-usually chronic. Dystrophic and atheromatous calcifications. No acute fracture or dislocation. IMPRESSION: No acute finding. Electronically Signed   By: Jorje Guild M.D.   On: 11/04/2021 05:16   [place] EKG SITE RITE  Result Date: 10/27/2021 If Site Rite image not attached, placement could not be confirmed due to current cardiac rhythm.  US THORACENTESIS ASP PLEURAL SPACE W/IMG GUIDE  Result Date: 10/26/2021 INDICATION: Patient with MRSA sepsis and confusion with right-sided pleural effusion request received for diagnostic and therapeutic thoracentesis. EXAM: ULTRASOUND GUIDED RIGHT THORACENTESIS MEDICATIONS: Local 1% lidocaine only. COMPLICATIONS: None immediate. PROCEDURE: An ultrasound guided thoracentesis was thoroughly discussed with the patient and questions answered. The benefits, risks, alternatives and complications were also discussed. The patient understands and wishes to proceed with the procedure. Written consent was obtained. Ultrasound was performed to localize and mark an adequate pocket of fluid in the right chest. The area was then prepped and draped in the normal sterile fashion. 1% Lidocaine was used for local anesthesia. Under ultrasound guidance a 19 gauge, 7-cm, Yueh catheter was introduced. Thoracentesis was performed. The catheter was removed and a dressing applied. FINDINGS: A total of approximately 1.2 L of clear yellow fluid was removed. Samples were sent to the laboratory as requested by the clinical team. IMPRESSION: Successful ultrasound guided right thoracentesis yielding 1.2 L of pleural fluid. This exam was performed by Tsosie Billing PA-C, and was supervised and  interpreted by Dr. Denna Haggard. Electronically Signed   By: Albin Felling M.D.   On: 10/26/2021 08:02   DG Chest Port 1 View  Result Date: 10/25/2021 CLINICAL DATA:  Post thoracentesis EXAM: PORTABLE CHEST 1 VIEW COMPARISON:  10/24/2021 FINDINGS: Interval decrease in right pleural effusion. Improved aeration in the right lung base. No pneumothorax. Cardiac enlargement with changes of CABG. Vascular congestion and mild edema compatible with heart failure. Progression of left lower lobe atelectasis and effusion. IMPRESSION: No pneumothorax post right thoracentesis. Improved aeration right lung base Progression of left lower lobe airspace disease and left effusion Persistent pulmonary edema. Electronically Signed   By: Franchot Gallo M.D.   On: 10/25/2021 15:24   DG Chest Port 1 View  Result Date: 10/24/2021 CLINICAL DATA:  Shortness of breath. MRSA sepsis. EXAM: PORTABLE CHEST 1 VIEW COMPARISON:  10/20/2021 FINDINGS: The heart is borderline enlarged but stable. Persistent right lower lobe process likely combination of effusion, atelectasis and infiltrate. Underlying vascular congestion, peribronchial thickening and possible mild pulmonary edema. IMPRESSION: Persistent right lower lobe process. Suspect mild pulmonary edema also. Electronically Signed   By: Marijo Sanes M.D.   On: 10/24/2021 10:46   CT HEAD WO CONTRAST (5MM)  Result Date: 10/21/2021 CLINICAL DATA:  Delirium EXAM: CT HEAD WITHOUT CONTRAST TECHNIQUE: Contiguous axial images were obtained from the base of the skull through the vertex without intravenous contrast. RADIATION DOSE REDUCTION: This exam was performed according to the departmental dose-optimization program which includes automated exposure control, adjustment of the mA and/or kV according to patient size and/or use of iterative reconstruction technique. COMPARISON:  Head CT 10/06/2021 FINDINGS: Brain: No evidence of acute infarction, hemorrhage, hydrocephalus, extra-axial collection or  mass lesion/mass effect. Generalized cerebral volume loss. Vascular: No hyperdense vessel or unexpected calcification. Skull: Normal. Negative for fracture or focal lesion. Sinuses/Orbits: No acute finding. IMPRESSION: No acute or reversible finding. Electronically Signed   By: Jorje Guild M.D.   On: 10/21/2021 10:25   DG Chest Port 1 View  Result Date: 10/20/2021 CLINICAL DATA:  BILATERAL lower extremity redness  and swelling greater on LEFT, continue hypoxia for 1 week EXAM: PORTABLE CHEST 1 VIEW COMPARISON:  Portable exam 1403 hours compared to 10/17/2021 FINDINGS: Enlargement of cardiac silhouette post CABG. Pulmonary vascular congestion. RIGHT pleural effusion with and basilar atelectasis. Remaining lungs clear. No pneumothorax or acute osseous findings. IMPRESSION: Persistent RIGHT pleural effusion and RIGHT basilar atelectasis. Enlargement of cardiac silhouette post CABG. Electronically Signed   By: Lavonia Dana M.D.   On: 10/20/2021 14:30   DG Chest Port 1 View  Result Date: 10/17/2021 CLINICAL DATA:  Provided history: Hypoxia. EXAM: PORTABLE CHEST 1 VIEW COMPARISON:  Provided history: Prior chest radiographs 10/14/2021 and earlier. FINDINGS: Prior median sternotomy/CABG. Cardiomegaly. Central pulmonary vascular congestion. Persistent small right pleural effusion. Additional ill-defined opacity within the right mid to lower lung field has an appearance favoring atelectasis. Minimal atelectasis within the left lung base. No evidence of pneumothorax. Degenerative changes of the spine. IMPRESSION: Cardiomegaly with central pulmonary vascular congestion. Persistent small right pleural effusion. Additional ill-defined opacity within the right mid to lower lung field has an appearance favoring atelectasis. However, superimposed pneumonia cannot be excluded and clinical correlation is recommended. Minimal atelectasis within the left lung base. Electronically Signed   By: Kellie Simmering D.O.   On: 10/17/2021  10:35   DG Chest Port 1 View  Result Date: 10/14/2021 CLINICAL DATA:  Hypoxia EXAM: PORTABLE CHEST 1 VIEW.  Rotation on frontal view. COMPARISON:  Chest x-ray 09/28/2021, chest x-ray 09/28/2021, chest x-ray 08/09/2009 FINDINGS: The heart and mediastinal contours are within normal limits. Increased interstitial markings. Interval development of a likely loculated trace to small volume right pleural effusion. Possible trace left pleural effusion. No pneumothorax. No acute osseous abnormality. IMPRESSION: 1. Interval development of a likely loculated trace to small volume right pleural effusion. 2. Possible trace left pleural effusion. 3. Pulmonary edema.  Superimposed infection not excluded. 4. Followup PA and lateral chest X-ray is recommended in 3-4 weeks following therapy to ensure resolution and exclude underlying malignancy. Electronically Signed   By: Iven Finn M.D.   On: 10/14/2021 16:57      Subjective: Seen and examined on the day of dc.  Appropriate for discharge to SNF  Discharge Exam: Vitals:   11/13/21 0408 11/13/21 0818  BP: 127/86 115/88  Pulse: (!) 110 (!) 118  Resp: 16 18  Temp: 98.4 F (36.9 C) (!) 97.1 F (36.2 C)  SpO2: 96% 95%   Vitals:   11/12/21 1920 11/12/21 1937 11/13/21 0408 11/13/21 0818  BP: 121/79  127/86 115/88  Pulse: (!) 110  (!) 110 (!) 118  Resp: '16  16 18  '$ Temp: 98.3 F (36.8 C)  98.4 F (36.9 C) (!) 97.1 F (36.2 C)  TempSrc:      SpO2: 93% 92% 96% 95%  Weight:      Height:        General: Pt is alert, awake, not in acute distress Cardiovascular: RRR, S1/S2 +, no rubs, no gallops Respiratory: CTA bilaterally, no wheezing, no rhonchi Abdominal: Soft, NT, ND, bowel sounds + Extremities: no edema, no cyanosis    The results of significant diagnostics from this hospitalization (including imaging, microbiology, ancillary and laboratory) are listed below for reference.     Microbiology: No results found for this or any previous visit  (from the past 240 hour(s)).   Labs: BNP (last 3 results) Recent Labs    10/03/21 1315 10/15/21 0548 10/26/21 1828  BNP 527.6* 295.6* 366.4*   Basic Metabolic Panel: Recent Labs  Lab 11/07/21 0352 11/08/21 0512 11/09/21 0504 11/10/21 0638  NA 140  --  138 137  K 4.2  --  4.7 4.8  CL 103  --  103 103  CO2 29  --  30 29  GLUCOSE 144*  --  124* 180*  BUN 22  --  23 28*  CREATININE 0.70  --  0.71 0.75  CALCIUM 8.6*  --  8.8* 8.8*  MG 2.1 2.0  --   --   PHOS 3.8 4.0  --   --    Liver Function Tests: No results for input(s): "AST", "ALT", "ALKPHOS", "BILITOT", "PROT", "ALBUMIN" in the last 168 hours. No results for input(s): "LIPASE", "AMYLASE" in the last 168 hours. No results for input(s): "AMMONIA" in the last 168 hours. CBC: Recent Labs  Lab 11/07/21 0352 11/10/21 0638 11/13/21 0446  WBC 6.9 5.9 6.7  HGB 9.7* 10.1* 10.7*  HCT 32.1* 33.0* 34.6*  MCV 92.2 90.9 90.1  PLT 300 246 223   Cardiac Enzymes: No results for input(s): "CKTOTAL", "CKMB", "CKMBINDEX", "TROPONINI" in the last 168 hours. BNP: Invalid input(s): "POCBNP" CBG: Recent Labs  Lab 11/12/21 0806 11/12/21 1212 11/12/21 1631 11/12/21 2052 11/13/21 0751  GLUCAP 181* 187* 137* 172* 138*   D-Dimer No results for input(s): "DDIMER" in the last 72 hours. Hgb A1c No results for input(s): "HGBA1C" in the last 72 hours. Lipid Profile No results for input(s): "CHOL", "HDL", "LDLCALC", "TRIG", "CHOLHDL", "LDLDIRECT" in the last 72 hours. Thyroid function studies No results for input(s): "TSH", "T4TOTAL", "T3FREE", "THYROIDAB" in the last 72 hours.  Invalid input(s): "FREET3" Anemia work up No results for input(s): "VITAMINB12", "FOLATE", "FERRITIN", "TIBC", "IRON", "RETICCTPCT" in the last 72 hours. Urinalysis    Component Value Date/Time   COLORURINE STRAW (A) 11/06/2021 1900   APPEARANCEUR CLEAR (A) 11/06/2021 1900   LABSPEC 1.008 11/06/2021 1900   PHURINE 7.0 11/06/2021 1900   GLUCOSEU  NEGATIVE 11/06/2021 1900   HGBUR NEGATIVE 11/06/2021 1900   BILIRUBINUR NEGATIVE 11/06/2021 1900   KETONESUR NEGATIVE 11/06/2021 1900   PROTEINUR 100 (A) 11/06/2021 1900   NITRITE NEGATIVE 11/06/2021 1900   LEUKOCYTESUR LARGE (A) 11/06/2021 1900   Sepsis Labs Recent Labs  Lab 11/07/21 0352 11/10/21 0638 11/13/21 0446  WBC 6.9 5.9 6.7   Microbiology No results found for this or any previous visit (from the past 240 hour(s)).   Time coordinating discharge: Over 30 minutes  SIGNED:   Sidney Ace, MD  [facility] 11/13/2021, 8:57 AM Pager   If 7PM-7AM, please contact night-coverage

## 2021-11-13 NOTE — TOC Progression Note (Addendum)
Transition of Care (TOC) - Progression Note    Patient Details  Name: [name]. MRN: [id number] Date of Birth: 1955/10/07  Transition of Care [facility] CM/SW Contact  Eileen Stanford, LCSW Phone Number: 11/13/2021, 11:19 AM  Clinical Narrative:   Per Kenney Houseman at [facility] was ordered this morning. They were told it would probably be delivered by this evening. Pt cant go to SNF until delivered. MD aware.  Bipap has been delivered.  Expected Discharge Plan: [place] Barriers to Discharge: Continued Medical Work up  Expected Discharge Plan and Services Expected Discharge Plan: [place] In-house Referral: NA   Post Acute Care Choice: Resumption of Svcs/PTA Provider Living arrangements for the past 2 months: Single Family Home Expected Discharge Date: 11/13/21                                     Social Determinants of Health (SDOH) Interventions    Readmission Risk Interventions    09/29/2021   11:12 AM  Readmission Risk Prevention Plan  Transportation Screening Complete  PCP or Specialist Appt within 3-5 Days Complete  HRI or [place] Complete  Social Work Consult for [place] Planning/Counseling Complete  Palliative Care Screening Not Applicable  Medication Review Press photographer) Complete

## 2021-11-13 NOTE — TOC Transition Note (Signed)
Transition of Care [facility]) - CM/SW Discharge Note   Patient Details  Name: [name]. MRN: [id number] Date of Birth: 12-Feb-1956  Transition of Care [facility]) CM/SW Contact:  Eileen Stanford, LCSW Phone Number: 11/13/2021, 12:15 PM   Clinical Narrative:   Clinical Social Worker facilitated patient discharge including contacting patient family and facility to confirm patient discharge plans.  Clinical information faxed to facility and family agreeable with plan.  CSW arranged ambulance transport via [facility] to [facility] .  RN to call [phone] for report prior to discharge.      Final next level of care: Skilled Nursing Facility Barriers to Discharge: No Barriers Identified   Patient Goals and CMS Choice        Discharge Placement              Patient chooses bed at:  [facility]) Patient to be transferred to facility by: [facility] Name of family member notified: reached out to [facility], mailbox full unable to leave voicemail Patient and family notified of of transfer: 11/13/21  Discharge Plan and Services In-house Referral: NA   Post Acute Care Choice: Resumption of Svcs/PTA Provider                               Social Determinants of Health (SDOH) Interventions     Readmission Risk Interventions    09/29/2021   11:12 AM  Readmission Risk Prevention Plan  Transportation Screening Complete  PCP or Specialist Appt within 3-5 Days Complete  HRI or [place] Complete  Social Work Consult for [place] Planning/Counseling Complete  Palliative Care Screening Not Applicable  Medication Review Press photographer) Complete

## 2021-11-13 NOTE — Plan of Care (Signed)

## 2021-11-13 NOTE — Progress Notes (Signed)
EMS transported pt from [facility] to [facility].

## 2021-11-13 NOTE — Care Management Important Message (Signed)
Important Message  Patient Details  Name: [name]. MRN: [id number] Date of Birth: 1955-08-03   Medicare Important Message Given:  Yes  Reviewed Medicare IM with patient via room phone due to isolation status.     [name] 11/13/2021, 9:58 AM

## 2021-11-13 NOTE — Progress Notes (Signed)
[place] care and reported to [place], [place].

## 2021-11-14 DIAGNOSIS — E440 Moderate protein-calorie malnutrition: Secondary | ICD-10-CM | POA: Diagnosis not present

## 2021-11-14 DIAGNOSIS — E669 Obesity, unspecified: Secondary | ICD-10-CM | POA: Diagnosis not present

## 2021-11-14 DIAGNOSIS — I480 Paroxysmal atrial fibrillation: Secondary | ICD-10-CM | POA: Diagnosis not present

## 2021-11-14 DIAGNOSIS — E11649 Type 2 diabetes mellitus with hypoglycemia without coma: Secondary | ICD-10-CM | POA: Diagnosis not present

## 2021-11-16 DIAGNOSIS — I10 Essential (primary) hypertension: Secondary | ICD-10-CM | POA: Diagnosis not present

## 2021-11-16 DIAGNOSIS — E669 Obesity, unspecified: Secondary | ICD-10-CM | POA: Diagnosis not present

## 2021-11-16 DIAGNOSIS — F329 Major depressive disorder, single episode, unspecified: Secondary | ICD-10-CM | POA: Diagnosis not present

## 2021-11-16 DIAGNOSIS — J449 Chronic obstructive pulmonary disease, unspecified: Secondary | ICD-10-CM | POA: Diagnosis not present

## 2021-11-17 ENCOUNTER — Ambulatory Visit: Payer: Medicare Other | Admitting: Physician Assistant

## 2021-11-17 DIAGNOSIS — E119 Type 2 diabetes mellitus without complications: Secondary | ICD-10-CM | POA: Diagnosis not present

## 2021-11-17 DIAGNOSIS — I10 Essential (primary) hypertension: Secondary | ICD-10-CM | POA: Diagnosis not present

## 2021-11-17 DIAGNOSIS — M6281 Muscle weakness (generalized): Secondary | ICD-10-CM | POA: Diagnosis not present

## 2021-11-20 DIAGNOSIS — Z4781 Encounter for orthopedic aftercare following surgical amputation: Secondary | ICD-10-CM | POA: Diagnosis not present

## 2021-11-20 DIAGNOSIS — E669 Obesity, unspecified: Secondary | ICD-10-CM | POA: Diagnosis not present

## 2021-11-20 DIAGNOSIS — F329 Major depressive disorder, single episode, unspecified: Secondary | ICD-10-CM | POA: Diagnosis not present

## 2021-11-20 DIAGNOSIS — I10 Essential (primary) hypertension: Secondary | ICD-10-CM | POA: Diagnosis not present

## 2021-11-20 NOTE — Progress Notes (Unsigned)
Patient ID: Alec Less., male    DOB: [birth year], 66 y.o.   MRN: [id number]   [name] is a 66 y/o male with a history of atrial fibrillation, asthma, CAD, DM, hyperlipidemia, HTN, depression, gout, hypogonadism, PVD, COPD, pleurisy, sleep apnea, previous tobacco use and chronic heart failure.   Echo report from 10/05/21 reviewed and showed an EF of 55-60% along with mild LAE and mild Alec. Echo report from 05/10/21 reviewed and showed an EF of 50-55% along with moderate LVH  LHC done 02/02/20 and showed: Mid RCA lesion is 90% stenosed. Prox LAD to Mid LAD lesion is 100% stenosed. Mid Cx lesion is 100% stenosed. Origin to Prox Graft lesion is 100% stenosed.  LM normal LAD-100% proxiimal LCx-100% mid RCA-90% mid  Admitted 09/28/21 due to bilateral lower extremity swelling, increased redness and swelling involving the left lower extremity mostly over the heel due to left heel osteomyelitis. Wound, podiatry, cardiology, vascular, ID, palliative care and psych consults obtained. Underwent left BKA. IV lasix resumed. Need bipap or high flow oxygen post surgery. PICC line removed. PT/OT evaluations done. Discharged after 46 days to SNF. Admitted 08/04/21 due to worsening cellulitis of his left foot that was not responding to outpatient antibiotics. Wound, vascular and ID consults obtained.  MRI of the left ankle was done noting early osteomyelitis. Podiatry took patient on 4/10 for wound debridement and partial calcanectomy.  Postop, they are recommending 6 weeks of IV antibiotics. PT/OT evaluations done. Discharged after 7 days. Discharged after 8 days.  He presents today for a follow up visit with a chief complaint of minimal shortness of breath with moderate exertion. Describes this as chronic in nature. He has associated fatigue, palpitations, tremors and right lower leg wound along with this. He denies any difficulty sleeping, abdominal distention, pedal edema, chest pain, cough, dizziness or  weight gain.   Not being weighed daily. Has CPAP at the facility but says that they keep adjusting the settings and sometimes he can't wear it very long. Working with PT/OT at facility after his left BKA. Hoping to get fitted for a prosthesis in the next few weeks with the goal to get home in a few months.   Past Medical History:  Diagnosis Date   Arrhythmia    atrial fibrillation   Asthma    CHF (congestive heart failure) (HCC)    COPD (chronic obstructive pulmonary disease) (HCC)    Coronary artery disease    Depression    Diabetes mellitus without complication (HCC)    Gout    History anabolic steroid use    Hyperlipidemia    Hypertension    Hypogonadism in male    Morbid obesity ([place])    Myocardial infarction ([place])    Peripheral vascular disease ([place])    Perirectal abscess    Pleurisy    Sleep apnea    CPAP at night, no oxygen   Varicella    Past Surgical History:  Procedure Laterality Date   ABDOMINAL AORTIC ANEURYSM REPAIR     ACHILLES TENDON SURGERY Left 01/10/2021   Procedure: ACHILLES LENGTHENING/KIDNER;  Surgeon: Caroline More, DPM;  Location: [facility] ORS;  Service: Podiatry;  Laterality: Left;   AMPUTATION Left 10/03/2021   Procedure: AMPUTATION BELOW KNEE;  Surgeon: Katha Cabal, MD;  Location: [facility] ORS;  Service: Vascular;  Laterality: Left;   AMPUTATION TOE Right 02/10/2016   Procedure: AMPUTATION TOE 3RD TOE;  Surgeon: Samara Deist, DPM;  Location: [facility] ORS;  Service: Podiatry;  Laterality: Right;   AMPUTATION TOE Left 02/24/2020   Procedure: AMPUTATION TOE;  Surgeon: Caroline More, DPM;  Location: [facility] ORS;  Service: Podiatry;  Laterality: Left;   APPLICATION OF WOUND VAC Left 02/29/2020   Procedure: APPLICATION OF WOUND VAC;  Surgeon: Caroline More, DPM;  Location: [facility] ORS;  Service: Podiatry;  Laterality: Left;   COLONOSCOPY WITH PROPOFOL [name] 11/18/2015   Procedure: COLONOSCOPY WITH PROPOFOL;  Surgeon: Manya Silvas, MD;  Location: [facility] ENDOSCOPY;   Service: Endoscopy;  Laterality: [name];   CORONARY ARTERY BYPASS GRAFT     CORONARY STENT INTERVENTION [name] 02/02/2020   Procedure: CORONARY STENT INTERVENTION;  Surgeon: Isaias Cowman, MD;  Location: [place] CV LAB;  Service: Cardiovascular;  Laterality: [name];   INCISION AND DRAINAGE Left 08/07/2021   Procedure: INCISION AND DRAINAGE-Partial Calcanectomy;  Surgeon: Caroline More, DPM;  Location: [facility] ORS;  Service: Podiatry;  Laterality: Left;   IRRIGATION AND DEBRIDEMENT FOOT Left 02/29/2020   Procedure: IRRIGATION AND DEBRIDEMENT FOOT;  Surgeon: Caroline More, DPM;  Location: [facility] OR[facility];  Service: Podiatry;  Laterality: Left;   IRRIGATION AND DEBRIDEMENT FOOT Left 02/24/2020   Procedure: IRRIGATION AND DEBRIDEMENT FOOT;  Surgeon: Caroline More, DPM;  Location: [facility] ORS;  Service: Podiatry;  Laterality: Left;   KNEE ARTHROSCOPY     LEFT HEART CATH AND CORS/GRAFTS ANGIOGRAPHY [name] 02/02/2020   Procedure: LEFT HEART CATH AND CORS/GRAFTS ANGIOGRAPHY;  Surgeon: Teodoro Spray, MD;  Location: [place] CV LAB;  Service: Cardiovascular;  Laterality: [name];   LOWER EXTREMITY ANGIOGRAPHY Left 02/25/2020   Procedure: Lower Extremity Angiography;  Surgeon: Algernon Huxley, MD;  Location: [place] [facility] LAB;  Service: Cardiovascular;  Laterality: Left;   LOWER EXTREMITY ANGIOGRAPHY Left 01/04/2021   Procedure: LOWER EXTREMITY ANGIOGRAPHY;  Surgeon: Algernon Huxley, MD;  Location: [place] CV LAB;  Service: Cardiovascular;  Laterality: Left;   METATARSAL HEAD EXCISION Left 01/10/2021   Procedure: METATARSAL HEAD EXCISION - LEFT 5th;  Surgeon: Caroline More, DPM;  Location: [facility] ORS;  Service: Podiatry;  Laterality: Left;   PERIPHERAL VASCULAR CATHETERIZATION Right 01/24/2016   Procedure: Lower Extremity Angiography;  Surgeon: Katha Cabal, MD;  Location: [place] CV LAB;  Service: Cardiovascular;  Laterality: Right;   PERIPHERAL VASCULAR CATHETERIZATION Right 01/25/2016   Procedure: Lower  Extremity Angiography;  Surgeon: Katha Cabal, MD;  Location: [place] CV LAB;  Service: Cardiovascular;  Laterality: Right;   TOE AMPUTATION     TONSILLECTOMY     Family History  Problem Relation Age of Onset   Hypertension Father    Coronary artery disease Father    Alcohol abuse Father    Heart failure Brother    Social History   Tobacco Use   Smoking status: Former    Packs/day: 0.50    Years: 45.00    Total pack years: 22.50    Types: Cigarettes    Quit date: 04/05/2015    Years since quitting: 6.6   Smokeless tobacco: Never  Substance Use Topics   Alcohol use: Not Currently    Alcohol/week: 3.0 standard drinks of alcohol    Types: 3 Glasses of wine per week   Allergies  Allergen Reactions   Penicillins Other (See Comments)    Happened at 66 years old and pt. stated he passed out  He has tolerated amoxicillin/clavulanate and ampicillin/sulbactam   Statins     Other reaction(s): Muscle Pain Causes legs to ache per pt   Prior to Admission medications   Medication  Sig Start Date End Date Taking? Authorizing Provider  acetaminophen (TYLENOL) 325 MG tablet Take 2 tablets (650 mg total) by mouth every 6 (six) hours as needed for mild pain (or Fever >/= 101). 11/10/21  Yes Emeterio Reeve, DO  amiodarone (PACERONE) 200 MG tablet Take 200 mg by mouth daily.   Yes [provider]  apixaban (ELIQUIS) 5 MG TABS tablet Take 1 tablet (5 mg total) by mouth 2 (two) times daily. 01/26/16  Yes Stegmayer, Janalyn Harder, PA-C  aspirin EC 81 MG EC tablet Take 1 tablet (81 mg total) by mouth daily. Swallow whole. 05/17/21  Yes Dessa Phi, DO  budesonide (PULMICORT) 0.5 MG/2ML nebulizer solution Take 2 mLs (0.5 mg total) by nebulization 2 (two) times daily. 11/10/21 12/10/21 Yes Emeterio Reeve, DO  Cholecalciferol 25 MCG (1000 UT) tablet Take 1,000 Units by mouth daily.   Yes [provider]  Cyanocobalamin (VITAMIN B-12) 5000 MCG TBDP Take 10,000 mcg by mouth  daily.   Yes [provider]  diltiazem (CARDIZEM SR) 120 MG 12 hr capsule Take 1 capsule (120 mg total) by mouth every 12 (twelve) hours. 11/10/21  Yes Emeterio Reeve, DO  ezetimibe (ZETIA) 10 MG tablet Take 10 mg by mouth at bedtime.   Yes [provider]  ferrous sulfate 325 (65 FE) MG tablet Take 325 mg by mouth daily with breakfast.   Yes [provider]  finasteride (PROSCAR) 5 MG tablet Take 1 tablet (5 mg total) by mouth daily. 11/11/21  Yes Emeterio Reeve, DO  insulin aspart (NOVOLOG) 100 UNIT/ML injection Inject 12 Units into the skin 3 (three) times daily with meals. 11/10/21  Yes Emeterio Reeve, DO  insulin aspart (NOVOLOG) 100 UNIT/ML injection Inject 0-20 Units into the skin 3 (three) times daily with meals. 11/10/21  Yes Emeterio Reeve, DO  insulin glargine-yfgn (SEMGLEE) 100 UNIT/ML injection Inject 0.25 mLs (25 Units total) into the skin at bedtime. 11/10/21  Yes Emeterio Reeve, DO  levothyroxine (SYNTHROID) 25 MCG tablet Take 1 tablet (25 mcg total) by mouth daily at 6 (six) AM. 11/11/21  Yes Emeterio Reeve, DO  metFORMIN (GLUCOPHAGE) 1000 MG tablet Take 1,000 mg by mouth 2 (two) times daily.  12/02/17  Yes [provider]  Multiple Vitamins-Minerals (MULTIVITAMIN WITH MINERALS) tablet Take 1 tablet by mouth daily.   Yes [provider]  nitroGLYCERIN (NITROSTAT) 0.4 MG SL tablet Place 0.4 mg under the tongue every 5 (five) minutes as needed for chest pain.   Yes [provider]  primidone (MYSOLINE) 250 MG tablet Take 250 mg by mouth 2 (two) times daily. 01/23/20  Yes [provider]  risperiDONE (RISPERDAL) 0.5 MG tablet Take 1 tablet (0.5 mg total) by mouth 2 (two) times daily. 11/10/21  Yes Emeterio Reeve, DO  tamsulosin (FLOMAX) 0.4 MG CAPS capsule Take 0.4 mg by mouth daily.   Yes [provider]  traMADol (ULTRAM) 50 MG tablet Take 1 tablet (50 mg total) by mouth daily as needed for  moderate pain. 08/11/21  Yes Fritzi Mandes, MD  vitamin C (ASCORBIC ACID) 500 MG tablet Take 500 mg by mouth daily.   Yes [provider]  Ensure Max Protein (ENSURE MAX PROTEIN) LIQD Take 330 mLs (11 oz total) by mouth 2 (two) times daily. 11/10/21 12/10/21  Emeterio Reeve, DO  nystatin (MYCOSTATIN/NYSTOP) powder Apply topically 2 (two) times daily. 11/10/21   Emeterio Reeve, DO    Review of Systems  Constitutional:  Positive for fatigue. Negative for appetite change.  HENT:  Negative for congestion, postnasal drip and rhinorrhea.   Eyes: Negative.   Respiratory:  Positive for shortness of breath. Negative for cough.   Cardiovascular:  Positive for palpitations. Negative for chest pain and leg swelling.  Gastrointestinal:  Negative for abdominal distention and abdominal pain.  Endocrine: Negative.   Genitourinary: Negative.   Musculoskeletal:  Negative for arthralgias and back pain.  Skin:  Positive for wound (right lower leg).  Allergic/Immunologic: Negative.   Neurological:  Positive for tremors. Negative for dizziness and light-headedness.  Hematological:  Negative for adenopathy. Bruises/bleeds easily.  Psychiatric/Behavioral:  Negative for dysphoric mood and sleep disturbance (wearing CPAP but unsure of settings at facility). The patient is not nervous/anxious.    Vitals:   11/21/21 1141  BP: 121/80  Pulse: (!) 120  Resp: 16  SpO2: 97%  Height: '6\' 4"'$  (1.93 m)   Wt Readings from Last 3 Encounters:  11/07/21 (!) 383 lb (173.7 kg)  09/26/21 (!) 403 lb 2 oz (182.9 kg)  08/07/21 (!) 350 lb (158.8 kg)   Lab Results  Component Value Date   CREATININE 0.75 11/10/2021   CREATININE 0.71 11/09/2021   CREATININE 0.70 11/07/2021   Physical Exam Vitals and nursing note reviewed.  Constitutional:      Appearance: Normal appearance. He is obese.  HENT:     Head: Normocephalic and atraumatic.  Cardiovascular:     Rate and Rhythm: Regular rhythm. Tachycardia present.   Pulmonary:     Effort: No respiratory distress.     Breath sounds: No wheezing or rales.  Abdominal:     General: There is no distension.     Palpations: Abdomen is soft.     Tenderness: There is no abdominal tenderness.  Musculoskeletal:        General: Deformity (left BKA) present.     Cervical back: Normal range of motion.     Right lower leg: No edema.  Skin:    General: Skin is warm and dry.     Comments: Right lower leg wrapped  Neurological:     General: No focal deficit present.     Mental Status: He is alert and oriented to person, place, and time.  Psychiatric:        Mood and Affect: Mood normal.        Behavior: Behavior normal.        Thought Content: Thought content normal.   Assessment & Plan:  1: Chronic heart failure with preserved ejection fraction with structural changes (LAE)- - NYHA class II - euvolemic today - order written for facility to weight daily and call for an overnight weight gain of > 2 pounds or a weekly weight gain of > 5 pounds - may not be a good candidate for SGLT2 due to body habitus and frequent fungal infections around abdominal folds - consider starting entresto at his next visit - does wear his CPAP nightly although says that he's unsure of settings because staff and facility keep "messing with it" - not adding any salt to his food - BNP 10/26/21 was 276.6  2: HTN- - BP looks good (121/80) - saw PCP ([place]) 09/06/21; now seeing PCP at SNF - BMP 11/10/21 reviewed and showed sodium 137, potassium 4.8, creatinine 0.75 & GFR >60  3: DM- - saw endocrinology Honor Junes) 04/28/21 - A1c 08/05/21 was 10.2% - says that his fasting glucose at facility this morning was 48 which had to be corrected  4: Atrial fibrillation- - saw cardiology Corky Sox) 05/31/21 -  regular rhythm noted, although tachycardiac "always tachycardic"   5: PAD/ lymphedema- - had left BKA June 2023 - saw wound clinic 09/22/21 - receiving PT/OT at [place] medication list reviewed.   Return in 3 months, sooner if needed.

## 2021-11-21 ENCOUNTER — Ambulatory Visit: Payer: Medicare Other | Attending: Family | Admitting: Family

## 2021-11-21 ENCOUNTER — Encounter: Payer: Self-pay | Admitting: Family

## 2021-11-21 VITALS — BP 121/80 | HR 120 | Resp 16 | Ht 76.0 in

## 2021-11-21 DIAGNOSIS — J449 Chronic obstructive pulmonary disease, unspecified: Secondary | ICD-10-CM | POA: Diagnosis not present

## 2021-11-21 DIAGNOSIS — I739 Peripheral vascular disease, unspecified: Secondary | ICD-10-CM

## 2021-11-21 DIAGNOSIS — I480 Paroxysmal atrial fibrillation: Secondary | ICD-10-CM | POA: Diagnosis not present

## 2021-11-21 DIAGNOSIS — I10 Essential (primary) hypertension: Secondary | ICD-10-CM | POA: Diagnosis not present

## 2021-11-21 DIAGNOSIS — Z89512 Acquired absence of left leg below knee: Secondary | ICD-10-CM | POA: Diagnosis not present

## 2021-11-21 DIAGNOSIS — R000 Tachycardia, unspecified: Secondary | ICD-10-CM | POA: Diagnosis not present

## 2021-11-21 DIAGNOSIS — I110 Hypertensive heart disease with heart failure: Secondary | ICD-10-CM | POA: Insufficient documentation

## 2021-11-21 DIAGNOSIS — E785 Hyperlipidemia, unspecified: Secondary | ICD-10-CM | POA: Insufficient documentation

## 2021-11-21 DIAGNOSIS — I4891 Unspecified atrial fibrillation: Secondary | ICD-10-CM | POA: Diagnosis not present

## 2021-11-21 DIAGNOSIS — E1151 Type 2 diabetes mellitus with diabetic peripheral angiopathy without gangrene: Secondary | ICD-10-CM | POA: Insufficient documentation

## 2021-11-21 DIAGNOSIS — I2510 Atherosclerotic heart disease of native coronary artery without angina pectoris: Secondary | ICD-10-CM | POA: Diagnosis not present

## 2021-11-21 DIAGNOSIS — I5032 Chronic diastolic (congestive) heart failure: Secondary | ICD-10-CM | POA: Insufficient documentation

## 2021-11-21 DIAGNOSIS — F32A Depression, unspecified: Secondary | ICD-10-CM | POA: Diagnosis not present

## 2021-11-21 DIAGNOSIS — G4730 Sleep apnea, unspecified: Secondary | ICD-10-CM | POA: Insufficient documentation

## 2021-11-21 DIAGNOSIS — I890 Lymphedema, not elsewhere classified: Secondary | ICD-10-CM | POA: Diagnosis not present

## 2021-11-21 DIAGNOSIS — Z794 Long term (current) use of insulin: Secondary | ICD-10-CM

## 2021-11-21 NOTE — Patient Instructions (Signed)
Continue weighing daily and call for an overnight weight gain of 3 pounds or more or a weekly weight gain of more than 5 pounds.  °

## 2021-11-22 DIAGNOSIS — J449 Chronic obstructive pulmonary disease, unspecified: Secondary | ICD-10-CM | POA: Diagnosis not present

## 2021-11-22 DIAGNOSIS — E039 Hypothyroidism, unspecified: Secondary | ICD-10-CM | POA: Diagnosis not present

## 2021-11-22 DIAGNOSIS — M545 Low back pain, unspecified: Secondary | ICD-10-CM | POA: Diagnosis not present

## 2021-11-24 DIAGNOSIS — E1169 Type 2 diabetes mellitus with other specified complication: Secondary | ICD-10-CM | POA: Diagnosis not present

## 2021-11-24 DIAGNOSIS — E1142 Type 2 diabetes mellitus with diabetic polyneuropathy: Secondary | ICD-10-CM | POA: Diagnosis not present

## 2021-11-24 DIAGNOSIS — Z794 Long term (current) use of insulin: Secondary | ICD-10-CM | POA: Diagnosis not present

## 2021-11-24 DIAGNOSIS — E119 Type 2 diabetes mellitus without complications: Secondary | ICD-10-CM | POA: Diagnosis not present

## 2021-11-24 DIAGNOSIS — E1159 Type 2 diabetes mellitus with other circulatory complications: Secondary | ICD-10-CM | POA: Diagnosis not present

## 2021-11-24 DIAGNOSIS — E669 Obesity, unspecified: Secondary | ICD-10-CM | POA: Diagnosis not present

## 2021-11-24 DIAGNOSIS — M545 Low back pain, unspecified: Secondary | ICD-10-CM | POA: Diagnosis not present

## 2021-11-27 DIAGNOSIS — M545 Low back pain, unspecified: Secondary | ICD-10-CM | POA: Diagnosis not present

## 2021-11-27 DIAGNOSIS — E119 Type 2 diabetes mellitus without complications: Secondary | ICD-10-CM | POA: Diagnosis not present

## 2021-11-27 DIAGNOSIS — E669 Obesity, unspecified: Secondary | ICD-10-CM | POA: Diagnosis not present

## 2021-11-29 DIAGNOSIS — E440 Moderate protein-calorie malnutrition: Secondary | ICD-10-CM | POA: Diagnosis not present

## 2021-11-29 DIAGNOSIS — E11649 Type 2 diabetes mellitus with hypoglycemia without coma: Secondary | ICD-10-CM | POA: Diagnosis not present

## 2021-11-29 DIAGNOSIS — I480 Paroxysmal atrial fibrillation: Secondary | ICD-10-CM | POA: Diagnosis not present

## 2021-11-29 DIAGNOSIS — M545 Low back pain, unspecified: Secondary | ICD-10-CM | POA: Diagnosis not present

## 2021-11-29 DIAGNOSIS — E119 Type 2 diabetes mellitus without complications: Secondary | ICD-10-CM | POA: Diagnosis not present

## 2021-11-29 DIAGNOSIS — E669 Obesity, unspecified: Secondary | ICD-10-CM | POA: Diagnosis not present

## 2021-12-01 DIAGNOSIS — E119 Type 2 diabetes mellitus without complications: Secondary | ICD-10-CM | POA: Diagnosis not present

## 2021-12-04 DIAGNOSIS — E119 Type 2 diabetes mellitus without complications: Secondary | ICD-10-CM | POA: Diagnosis not present

## 2021-12-06 DIAGNOSIS — E669 Obesity, unspecified: Secondary | ICD-10-CM | POA: Diagnosis not present

## 2021-12-06 DIAGNOSIS — E11649 Type 2 diabetes mellitus with hypoglycemia without coma: Secondary | ICD-10-CM | POA: Diagnosis not present

## 2021-12-06 DIAGNOSIS — I480 Paroxysmal atrial fibrillation: Secondary | ICD-10-CM | POA: Diagnosis not present

## 2021-12-06 DIAGNOSIS — E440 Moderate protein-calorie malnutrition: Secondary | ICD-10-CM | POA: Diagnosis not present

## 2021-12-07 ENCOUNTER — Telehealth (INDEPENDENT_AMBULATORY_CARE_PROVIDER_SITE_OTHER): Payer: Self-pay

## 2021-12-07 NOTE — Telephone Encounter (Signed)
Jasmine with [facility] called regarding pt being weightbearing or non weight baering.  She is working with him in therapy and they are trying to figure out the situation with his prosthetic. Please advise

## 2021-12-07 NOTE — Telephone Encounter (Signed)
We haven't seen him since his d/c from the hospital but if his incision is completely healed, he can weight bear

## 2021-12-08 NOTE — Telephone Encounter (Signed)
Spoke with [place] and she state that his incision is healed and they will start bearing weight

## 2021-12-08 NOTE — Telephone Encounter (Signed)
I called [name] twice and held for 4mns the last time with no answer

## 2021-12-11 DIAGNOSIS — Z4781 Encounter for orthopedic aftercare following surgical amputation: Secondary | ICD-10-CM | POA: Diagnosis not present

## 2021-12-11 DIAGNOSIS — Z89512 Acquired absence of left leg below knee: Secondary | ICD-10-CM | POA: Diagnosis not present

## 2021-12-11 DIAGNOSIS — L299 Pruritus, unspecified: Secondary | ICD-10-CM | POA: Diagnosis not present

## 2021-12-12 DIAGNOSIS — E11649 Type 2 diabetes mellitus with hypoglycemia without coma: Secondary | ICD-10-CM | POA: Diagnosis not present

## 2021-12-12 DIAGNOSIS — E440 Moderate protein-calorie malnutrition: Secondary | ICD-10-CM | POA: Diagnosis not present

## 2021-12-12 DIAGNOSIS — E669 Obesity, unspecified: Secondary | ICD-10-CM | POA: Diagnosis not present

## 2021-12-12 DIAGNOSIS — I480 Paroxysmal atrial fibrillation: Secondary | ICD-10-CM | POA: Diagnosis not present

## 2021-12-13 DIAGNOSIS — Z89512 Acquired absence of left leg below knee: Secondary | ICD-10-CM | POA: Diagnosis not present

## 2021-12-13 DIAGNOSIS — L299 Pruritus, unspecified: Secondary | ICD-10-CM | POA: Diagnosis not present

## 2021-12-13 DIAGNOSIS — Z794 Long term (current) use of insulin: Secondary | ICD-10-CM | POA: Diagnosis not present

## 2021-12-13 DIAGNOSIS — E11649 Type 2 diabetes mellitus with hypoglycemia without coma: Secondary | ICD-10-CM | POA: Diagnosis not present

## 2021-12-14 ENCOUNTER — Telehealth (INDEPENDENT_AMBULATORY_CARE_PROVIDER_SITE_OTHER): Payer: Self-pay

## 2021-12-14 ENCOUNTER — Encounter: Payer: Self-pay | Admitting: Vascular Surgery

## 2021-12-14 NOTE — Telephone Encounter (Signed)
Martinique Miller NP at [place] called concerning pt starting the process of getting a prothesis.  I let her know that he was in the hospital when his staples were removed by our NP and then he was admitted @ [facility] center.  He is to see [place] nexy month and Martinique Miller Np states that appt is soon enough that his stump is healed and he is doing great.  Martinique will also put in the referral for the [facility] to get the prothesis process started.

## 2021-12-15 DIAGNOSIS — Z4781 Encounter for orthopedic aftercare following surgical amputation: Secondary | ICD-10-CM | POA: Diagnosis not present

## 2021-12-15 DIAGNOSIS — L299 Pruritus, unspecified: Secondary | ICD-10-CM | POA: Diagnosis not present

## 2021-12-15 DIAGNOSIS — M6281 Muscle weakness (generalized): Secondary | ICD-10-CM | POA: Diagnosis not present

## 2021-12-15 DIAGNOSIS — Z89512 Acquired absence of left leg below knee: Secondary | ICD-10-CM | POA: Diagnosis not present

## 2021-12-18 DIAGNOSIS — E440 Moderate protein-calorie malnutrition: Secondary | ICD-10-CM | POA: Diagnosis not present

## 2021-12-18 DIAGNOSIS — I480 Paroxysmal atrial fibrillation: Secondary | ICD-10-CM | POA: Diagnosis not present

## 2021-12-18 DIAGNOSIS — E669 Obesity, unspecified: Secondary | ICD-10-CM | POA: Diagnosis not present

## 2021-12-18 DIAGNOSIS — E11649 Type 2 diabetes mellitus with hypoglycemia without coma: Secondary | ICD-10-CM | POA: Diagnosis not present

## 2021-12-20 DIAGNOSIS — R635 Abnormal weight gain: Secondary | ICD-10-CM | POA: Diagnosis not present

## 2021-12-20 DIAGNOSIS — R197 Diarrhea, unspecified: Secondary | ICD-10-CM | POA: Diagnosis not present

## 2021-12-20 DIAGNOSIS — I5033 Acute on chronic diastolic (congestive) heart failure: Secondary | ICD-10-CM | POA: Diagnosis not present

## 2021-12-21 ENCOUNTER — Telehealth (INDEPENDENT_AMBULATORY_CARE_PROVIDER_SITE_OTHER): Payer: Self-pay | Admitting: *Deleted

## 2021-12-21 DIAGNOSIS — Z91199 Patient's noncompliance with other medical treatment and regimen due to unspecified reason: Secondary | ICD-10-CM | POA: Diagnosis not present

## 2021-12-21 DIAGNOSIS — G4733 Obstructive sleep apnea (adult) (pediatric): Secondary | ICD-10-CM | POA: Diagnosis not present

## 2021-12-21 NOTE — Telephone Encounter (Signed)
Patient's spouse called and stated patient has been charged $25 for a missed appointment but he was in the hospital and transported to rehab.  She wants this $25 removed.  Please advise

## 2021-12-26 DIAGNOSIS — E669 Obesity, unspecified: Secondary | ICD-10-CM | POA: Diagnosis not present

## 2021-12-26 DIAGNOSIS — E440 Moderate protein-calorie malnutrition: Secondary | ICD-10-CM | POA: Diagnosis not present

## 2021-12-26 DIAGNOSIS — I480 Paroxysmal atrial fibrillation: Secondary | ICD-10-CM | POA: Diagnosis not present

## 2021-12-26 DIAGNOSIS — E11649 Type 2 diabetes mellitus with hypoglycemia without coma: Secondary | ICD-10-CM | POA: Diagnosis not present

## 2022-01-02 DIAGNOSIS — I480 Paroxysmal atrial fibrillation: Secondary | ICD-10-CM | POA: Diagnosis not present

## 2022-01-02 DIAGNOSIS — E669 Obesity, unspecified: Secondary | ICD-10-CM | POA: Diagnosis not present

## 2022-01-02 DIAGNOSIS — E11649 Type 2 diabetes mellitus with hypoglycemia without coma: Secondary | ICD-10-CM | POA: Diagnosis not present

## 2022-01-02 DIAGNOSIS — E440 Moderate protein-calorie malnutrition: Secondary | ICD-10-CM | POA: Diagnosis not present

## 2022-01-04 DIAGNOSIS — E119 Type 2 diabetes mellitus without complications: Secondary | ICD-10-CM | POA: Diagnosis not present

## 2022-01-04 DIAGNOSIS — G4733 Obstructive sleep apnea (adult) (pediatric): Secondary | ICD-10-CM | POA: Diagnosis not present

## 2022-01-04 DIAGNOSIS — Z91199 Patient's noncompliance with other medical treatment and regimen due to unspecified reason: Secondary | ICD-10-CM | POA: Diagnosis not present

## 2022-01-04 DIAGNOSIS — E669 Obesity, unspecified: Secondary | ICD-10-CM | POA: Diagnosis not present

## 2022-01-08 DIAGNOSIS — G4733 Obstructive sleep apnea (adult) (pediatric): Secondary | ICD-10-CM | POA: Diagnosis not present

## 2022-01-08 DIAGNOSIS — Z91199 Patient's noncompliance with other medical treatment and regimen due to unspecified reason: Secondary | ICD-10-CM | POA: Diagnosis not present

## 2022-01-08 DIAGNOSIS — E119 Type 2 diabetes mellitus without complications: Secondary | ICD-10-CM | POA: Diagnosis not present

## 2022-01-08 DIAGNOSIS — E669 Obesity, unspecified: Secondary | ICD-10-CM | POA: Diagnosis not present

## 2022-01-09 DIAGNOSIS — E440 Moderate protein-calorie malnutrition: Secondary | ICD-10-CM | POA: Diagnosis not present

## 2022-01-09 DIAGNOSIS — E669 Obesity, unspecified: Secondary | ICD-10-CM | POA: Diagnosis not present

## 2022-01-09 DIAGNOSIS — E11649 Type 2 diabetes mellitus with hypoglycemia without coma: Secondary | ICD-10-CM | POA: Diagnosis not present

## 2022-01-09 DIAGNOSIS — I480 Paroxysmal atrial fibrillation: Secondary | ICD-10-CM | POA: Diagnosis not present

## 2022-01-10 DIAGNOSIS — E119 Type 2 diabetes mellitus without complications: Secondary | ICD-10-CM | POA: Diagnosis not present

## 2022-01-10 DIAGNOSIS — Z91199 Patient's noncompliance with other medical treatment and regimen due to unspecified reason: Secondary | ICD-10-CM | POA: Diagnosis not present

## 2022-01-10 DIAGNOSIS — E669 Obesity, unspecified: Secondary | ICD-10-CM | POA: Diagnosis not present

## 2022-01-10 DIAGNOSIS — M6281 Muscle weakness (generalized): Secondary | ICD-10-CM | POA: Diagnosis not present

## 2022-01-16 ENCOUNTER — Ambulatory Visit (INDEPENDENT_AMBULATORY_CARE_PROVIDER_SITE_OTHER): Payer: Medicare Other | Admitting: Vascular Surgery

## 2022-01-16 ENCOUNTER — Encounter (INDEPENDENT_AMBULATORY_CARE_PROVIDER_SITE_OTHER): Payer: Medicare Other

## 2022-01-17 DIAGNOSIS — U071 COVID-19: Secondary | ICD-10-CM | POA: Diagnosis not present

## 2022-01-17 DIAGNOSIS — E119 Type 2 diabetes mellitus without complications: Secondary | ICD-10-CM | POA: Diagnosis not present

## 2022-01-17 DIAGNOSIS — Z91199 Patient's noncompliance with other medical treatment and regimen due to unspecified reason: Secondary | ICD-10-CM | POA: Diagnosis not present

## 2022-01-17 DIAGNOSIS — G4733 Obstructive sleep apnea (adult) (pediatric): Secondary | ICD-10-CM | POA: Diagnosis not present

## 2022-01-18 DIAGNOSIS — R5383 Other fatigue: Secondary | ICD-10-CM | POA: Diagnosis not present

## 2022-01-19 DIAGNOSIS — I517 Cardiomegaly: Secondary | ICD-10-CM | POA: Diagnosis not present

## 2022-01-19 DIAGNOSIS — R5383 Other fatigue: Secondary | ICD-10-CM | POA: Diagnosis not present

## 2022-01-23 DIAGNOSIS — E11649 Type 2 diabetes mellitus with hypoglycemia without coma: Secondary | ICD-10-CM | POA: Diagnosis not present

## 2022-01-23 DIAGNOSIS — I480 Paroxysmal atrial fibrillation: Secondary | ICD-10-CM | POA: Diagnosis not present

## 2022-01-23 DIAGNOSIS — E669 Obesity, unspecified: Secondary | ICD-10-CM | POA: Diagnosis not present

## 2022-01-23 DIAGNOSIS — E440 Moderate protein-calorie malnutrition: Secondary | ICD-10-CM | POA: Diagnosis not present

## 2022-01-30 DIAGNOSIS — M25561 Pain in right knee: Secondary | ICD-10-CM | POA: Diagnosis not present

## 2022-02-01 ENCOUNTER — Telehealth (INDEPENDENT_AMBULATORY_CARE_PROVIDER_SITE_OTHER): Payer: Self-pay

## 2022-02-01 DIAGNOSIS — G4733 Obstructive sleep apnea (adult) (pediatric): Secondary | ICD-10-CM | POA: Diagnosis not present

## 2022-02-01 DIAGNOSIS — E119 Type 2 diabetes mellitus without complications: Secondary | ICD-10-CM | POA: Diagnosis not present

## 2022-02-01 DIAGNOSIS — I480 Paroxysmal atrial fibrillation: Secondary | ICD-10-CM | POA: Diagnosis not present

## 2022-02-01 DIAGNOSIS — J449 Chronic obstructive pulmonary disease, unspecified: Secondary | ICD-10-CM | POA: Diagnosis not present

## 2022-02-01 NOTE — Telephone Encounter (Signed)
Pt states he ahs an appt on 10/6 that he cannot make due to rehab.  He needs to reschedule until next week.  Please call pt.

## 2022-02-02 ENCOUNTER — Encounter (INDEPENDENT_AMBULATORY_CARE_PROVIDER_SITE_OTHER): Payer: Medicare Other

## 2022-02-02 ENCOUNTER — Ambulatory Visit (INDEPENDENT_AMBULATORY_CARE_PROVIDER_SITE_OTHER): Payer: Medicare Other | Admitting: Vascular Surgery

## 2022-02-05 DIAGNOSIS — Z4781 Encounter for orthopedic aftercare following surgical amputation: Secondary | ICD-10-CM | POA: Diagnosis not present

## 2022-02-05 DIAGNOSIS — J9601 Acute respiratory failure with hypoxia: Secondary | ICD-10-CM | POA: Diagnosis not present

## 2022-02-05 DIAGNOSIS — M6281 Muscle weakness (generalized): Secondary | ICD-10-CM | POA: Diagnosis not present

## 2022-02-07 ENCOUNTER — Ambulatory Visit: Payer: Medicare Other | Attending: Family | Admitting: Family

## 2022-02-07 ENCOUNTER — Encounter: Payer: Self-pay | Admitting: Family

## 2022-02-07 VITALS — BP 169/154 | HR 54 | Resp 16 | Wt 295.0 lb

## 2022-02-07 DIAGNOSIS — I5032 Chronic diastolic (congestive) heart failure: Secondary | ICD-10-CM

## 2022-02-07 DIAGNOSIS — I890 Lymphedema, not elsewhere classified: Secondary | ICD-10-CM | POA: Insufficient documentation

## 2022-02-07 DIAGNOSIS — J4489 Other specified chronic obstructive pulmonary disease: Secondary | ICD-10-CM | POA: Insufficient documentation

## 2022-02-07 DIAGNOSIS — I110 Hypertensive heart disease with heart failure: Secondary | ICD-10-CM | POA: Insufficient documentation

## 2022-02-07 DIAGNOSIS — I2510 Atherosclerotic heart disease of native coronary artery without angina pectoris: Secondary | ICD-10-CM | POA: Diagnosis not present

## 2022-02-07 DIAGNOSIS — Z89512 Acquired absence of left leg below knee: Secondary | ICD-10-CM | POA: Insufficient documentation

## 2022-02-07 DIAGNOSIS — Z794 Long term (current) use of insulin: Secondary | ICD-10-CM

## 2022-02-07 DIAGNOSIS — F32A Depression, unspecified: Secondary | ICD-10-CM | POA: Diagnosis not present

## 2022-02-07 DIAGNOSIS — I4891 Unspecified atrial fibrillation: Secondary | ICD-10-CM | POA: Diagnosis not present

## 2022-02-07 DIAGNOSIS — Z87891 Personal history of nicotine dependence: Secondary | ICD-10-CM | POA: Insufficient documentation

## 2022-02-07 DIAGNOSIS — E785 Hyperlipidemia, unspecified: Secondary | ICD-10-CM | POA: Insufficient documentation

## 2022-02-07 DIAGNOSIS — M109 Gout, unspecified: Secondary | ICD-10-CM | POA: Diagnosis not present

## 2022-02-07 DIAGNOSIS — E1151 Type 2 diabetes mellitus with diabetic peripheral angiopathy without gangrene: Secondary | ICD-10-CM | POA: Diagnosis not present

## 2022-02-07 DIAGNOSIS — G4730 Sleep apnea, unspecified: Secondary | ICD-10-CM | POA: Insufficient documentation

## 2022-02-07 DIAGNOSIS — I10 Essential (primary) hypertension: Secondary | ICD-10-CM | POA: Diagnosis not present

## 2022-02-07 DIAGNOSIS — Z8249 Family history of ischemic heart disease and other diseases of the circulatory system: Secondary | ICD-10-CM | POA: Insufficient documentation

## 2022-02-07 DIAGNOSIS — I480 Paroxysmal atrial fibrillation: Secondary | ICD-10-CM | POA: Diagnosis not present

## 2022-02-07 DIAGNOSIS — I739 Peripheral vascular disease, unspecified: Secondary | ICD-10-CM

## 2022-02-07 NOTE — Progress Notes (Signed)
Patient ID: Alec Less., male    DOB: [birth year], 66 y.o.   MRN: [id number]   Mr Sitzman is a 66 y/o male with a history of atrial fibrillation, asthma, CAD, DM, hyperlipidemia, HTN, depression, gout, hypogonadism, PVD, COPD, pleurisy, sleep apnea, previous tobacco use and chronic heart failure.   Echo report from 10/05/21 reviewed and showed an EF of 55-60% along with mild LAE and mild MR. Echo report from 05/10/21 reviewed and showed an EF of 50-55% along with moderate LVH  LHC done 02/02/20 and showed: Mid RCA lesion is 90% stenosed. Prox LAD to Mid LAD lesion is 100% stenosed. Mid Cx lesion is 100% stenosed. Origin to Prox Graft lesion is 100% stenosed.  LM normal LAD-100% proxiimal LCx-100% mid RCA-90% mid  Admitted 09/28/21 due to bilateral lower extremity swelling, increased redness and swelling involving the left lower extremity mostly over the heel due to left heel osteomyelitis. Wound, podiatry, cardiology, vascular, ID, palliative care and psych consults obtained. Underwent left BKA. IV lasix resumed. Need bipap or high flow oxygen post surgery. PICC line removed. PT/OT evaluations done. Discharged after 46 days to SNF.   He presents today for a follow up visit with a chief complaint of minimal fatigue upon moderate exertion. Describes this as chronic in nature. He has associated bruising & weight loss along with this. Denies any difficulty sleeping, dizziness, abdominal distention, palpitations, pedal edema, chest pain, shortness of breath, cough or weight gain.   Still adjusting to his left BKA and does feel weaker since leaving rehab. PT/OT is to start soon. Does have a ramp at home but he and his wife say that they need some social service resources and are unsure of what exactly they need or how to go about asking.   Not adding salt but did eat out at a chinese buffet last night to celebrate his birthday. Not weighing at home because of his instability and doesn't feel  comfortable getting up to weigh today in the office. Last weight was 295 pounds on 02/05/22  Past Medical History:  Diagnosis Date   Arrhythmia    atrial fibrillation   Asthma    CHF (congestive heart failure) (HCC)    COPD (chronic obstructive pulmonary disease) (HCC)    Coronary artery disease    Depression    Diabetes mellitus without complication ([place])    Gout    History anabolic steroid use    Hyperlipidemia    Hypertension    Hypogonadism in male    Morbid obesity ([place])    Myocardial infarction ([place])    Peripheral vascular disease ([place])    Perirectal abscess    Pleurisy    Sleep apnea    CPAP at night, no oxygen   Varicella    Past Surgical History:  Procedure Laterality Date   ABDOMINAL AORTIC ANEURYSM REPAIR     ACHILLES TENDON SURGERY Left 01/10/2021   Procedure: ACHILLES LENGTHENING/KIDNER;  Surgeon: Caroline More, DPM;  Location: [facility];  Service: Podiatry;  Laterality: Left;   AMPUTATION Left 10/03/2021   Procedure: AMPUTATION BELOW KNEE;  Surgeon: Katha Cabal, MD;  Location: [facility];  Service: Vascular;  Laterality: Left;   AMPUTATION TOE Right 02/10/2016   Procedure: AMPUTATION TOE 3RD TOE;  Surgeon: Samara Deist, DPM;  Location: [facility];  Service: Podiatry;  Laterality: Right;   AMPUTATION TOE Left 02/24/2020   Procedure: AMPUTATION TOE;  Surgeon: Caroline More, DPM;  Location: [facility];  Service: Podiatry;  Laterality: Left;  APPLICATION OF WOUND VAC Left 02/29/2020   Procedure: APPLICATION OF WOUND VAC;  Surgeon: Caroline More, DPM;  Location: [facility];  Service: Podiatry;  Laterality: Left;   COLONOSCOPY WITH PROPOFOL [name] 11/18/2015   Procedure: COLONOSCOPY WITH PROPOFOL;  Surgeon: Manya Silvas, MD;  Location: [facility];  Service: Endoscopy;  Laterality: [name];   CORONARY ARTERY BYPASS GRAFT     CORONARY STENT INTERVENTION [name] 02/02/2020   Procedure: CORONARY STENT INTERVENTION;  Surgeon: Isaias Cowman, MD;  Location: [place] [facility];  Service: Cardiovascular;  Laterality: [name];   INCISION AND DRAINAGE Left 08/07/2021   Procedure: INCISION AND DRAINAGE-Partial Calcanectomy;  Surgeon: Caroline More, DPM;  Location: [facility];  Service: Podiatry;  Laterality: Left;   IRRIGATION AND DEBRIDEMENT FOOT Left 02/29/2020   Procedure: IRRIGATION AND DEBRIDEMENT FOOT;  Surgeon: Caroline More, DPM;  Location: [facility];  Service: Podiatry;  Laterality: Left;   IRRIGATION AND DEBRIDEMENT FOOT Left 02/24/2020   Procedure: IRRIGATION AND DEBRIDEMENT FOOT;  Surgeon: Caroline More, DPM;  Location: [facility];  Service: Podiatry;  Laterality: Left;   KNEE ARTHROSCOPY     LEFT HEART CATH AND CORS/GRAFTS ANGIOGRAPHY [name] 02/02/2020   Procedure: LEFT HEART CATH AND CORS/GRAFTS ANGIOGRAPHY;  Surgeon: Teodoro Spray, MD;  Location: [place] [facility];  Service: Cardiovascular;  Laterality: [name];   LOWER EXTREMITY ANGIOGRAPHY Left 02/25/2020   Procedure: Lower Extremity Angiography;  Surgeon: Algernon Huxley, MD;  Location: [place];  Service: Cardiovascular;  Laterality: Left;   LOWER EXTREMITY ANGIOGRAPHY Left 01/04/2021   Procedure: LOWER EXTREMITY ANGIOGRAPHY;  Surgeon: Algernon Huxley, MD;  Location: [place] [facility];  Service: Cardiovascular;  Laterality: Left;   METATARSAL HEAD EXCISION Left 01/10/2021   Procedure: METATARSAL HEAD EXCISION - LEFT 5th;  Surgeon: Caroline More, DPM;  Location: [facility];  Service: Podiatry;  Laterality: Left;   PERIPHERAL VASCULAR CATHETERIZATION Right 01/24/2016   Procedure: Lower Extremity Angiography;  Surgeon: Katha Cabal, MD;  Location: [place] [facility];  Service: Cardiovascular;  Laterality: Right;   PERIPHERAL VASCULAR CATHETERIZATION Right 01/25/2016   Procedure: Lower Extremity Angiography;  Surgeon: Katha Cabal, MD;  Location: [place] [facility];  Service: Cardiovascular;  Laterality: Right;   TOE AMPUTATION     TONSILLECTOMY     Family History  Problem Relation Age of Onset    Hypertension Father    Coronary artery disease Father    Alcohol abuse Father    Heart failure Brother    Social History   Tobacco Use   Smoking status: Former    Packs/day: 0.50    Years: 45.00    Total pack years: 22.50    Types: Cigarettes    Quit date: 04/05/2015    Years since quitting: 6.8   Smokeless tobacco: Never  Substance Use Topics   Alcohol use: Not Currently    Alcohol/week: 3.0 standard drinks of alcohol    Types: 3 Glasses of wine per week   Allergies  Allergen Reactions   Penicillins Other (See Comments)    Happened at 66 years old and pt. stated he passed out  He has tolerated amoxicillin/clavulanate and ampicillin/sulbactam   Statins     Other reaction(s): Muscle Pain Causes legs to ache per pt   Prior to Admission medications   Medication Sig Start Date End Date Taking? Authorizing Provider  acetaminophen (TYLENOL) 325 MG tablet Take 2 tablets (650 mg total) by mouth every 6 (six) hours as needed for mild  pain (or Fever >/= 101). 11/10/21  Yes Emeterio Reeve, DO  amiodarone (PACERONE) 200 MG tablet Take 200 mg by mouth daily.   Yes [provider]  apixaban (ELIQUIS) 5 MG TABS tablet Take 1 tablet (5 mg total) by mouth 2 (two) times daily. 01/26/16  Yes Stegmayer, Janalyn Harder, PA-C  aspirin EC 81 MG EC tablet Take 1 tablet (81 mg total) by mouth daily. Swallow whole. 05/17/21  Yes Dessa Phi, DO  budesonide (PULMICORT) 0.5 MG/2ML nebulizer solution Take 2 mLs (0.5 mg total) by nebulization 2 (two) times daily. 11/10/21  Yes Emeterio Reeve, DO  Cholecalciferol 25 MCG (1000 UT) tablet Take 1,000 Units by mouth daily.   Yes [provider]  Cyanocobalamin (VITAMIN B-12) 5000 MCG TBDP Take 10,000 mcg by mouth daily.   Yes [provider]  diltiazem (CARDIZEM SR) 120 MG 12 hr capsule Take 1 capsule (120 mg total) by mouth every 12 (twelve) hours. 11/10/21  Yes Emeterio Reeve, DO  ezetimibe (ZETIA) 10 MG tablet Take 10 mg by  mouth at bedtime.   Yes [provider]  ferrous sulfate 325 (65 FE) MG tablet Take 325 mg by mouth daily with breakfast.   Yes [provider]  finasteride (PROSCAR) 5 MG tablet Take 1 tablet (5 mg total) by mouth daily. 11/11/21  Yes Emeterio Reeve, DO  insulin aspart (NOVOLOG) 100 UNIT/ML injection Inject 12 Units into the skin 3 (three) times daily with meals. 11/10/21  Yes Emeterio Reeve, DO  insulin aspart (NOVOLOG) 100 UNIT/ML injection Inject 0-20 Units into the skin 3 (three) times daily with meals. 11/10/21  Yes Emeterio Reeve, DO  insulin glargine-yfgn (SEMGLEE) 100 UNIT/ML injection Inject 0.25 mLs (25 Units total) into the skin at bedtime. 11/10/21  Yes Emeterio Reeve, DO  levothyroxine (SYNTHROID) 25 MCG tablet Take 1 tablet (25 mcg total) by mouth daily at 6 (six) AM. 11/11/21  Yes Emeterio Reeve, DO  metFORMIN (GLUCOPHAGE) 1000 MG tablet Take 1,000 mg by mouth 2 (two) times daily.  12/02/17  Yes [provider]  Multiple Vitamins-Minerals (MULTIVITAMIN WITH MINERALS) tablet Take 1 tablet by mouth daily.   Yes [provider]  nitroGLYCERIN (NITROSTAT) 0.4 MG SL tablet Place 0.4 mg under the tongue every 5 (five) minutes as needed for chest pain.   Yes [provider]  nystatin (MYCOSTATIN/NYSTOP) powder Apply topically 2 (two) times daily. 11/10/21  Yes Emeterio Reeve, DO  primidone (MYSOLINE) 250 MG tablet Take 250 mg by mouth 2 (two) times daily. 01/23/20  Yes [provider]  risperiDONE (RISPERDAL) 0.5 MG tablet Take 1 tablet (0.5 mg total) by mouth 2 (two) times daily. 11/10/21  Yes Emeterio Reeve, DO  tamsulosin (FLOMAX) 0.4 MG CAPS capsule Take 0.4 mg by mouth daily.   Yes [provider]  traMADol (ULTRAM) 50 MG tablet Take 1 tablet (50 mg total) by mouth daily as needed for moderate pain. 08/11/21  Yes Fritzi Mandes, MD  vitamin C (ASCORBIC ACID) 500 MG tablet Take 500 mg by mouth daily.   Yes  [provider]     Review of Systems  Constitutional:  Positive for fatigue. Negative for appetite change.  HENT:  Negative for congestion, postnasal drip and rhinorrhea.   Eyes: Negative.   Respiratory:  Negative for cough and shortness of breath.   Cardiovascular:  Negative for chest pain, palpitations and leg swelling.  Gastrointestinal:  Negative for abdominal distention and abdominal pain.  Endocrine: Negative.   Genitourinary: Negative.  Musculoskeletal:  Negative for arthralgias and back pain.  Skin: Negative.   Allergic/Immunologic: Negative.   Neurological:  Negative for dizziness, tremors and light-headedness.  Hematological:  Negative for adenopathy. Bruises/bleeds easily.  Psychiatric/Behavioral:  Negative for dysphoric mood and sleep disturbance (wearing CPAP inconsistently). The patient is not nervous/anxious.    Vitals:   02/07/22 1258  BP: (!) 169/154  Pulse: (!) 54  Resp: 16  SpO2: 95%  Weight: 295 lb (133.8 kg)   Wt Readings from Last 3 Encounters:  02/07/22 295 lb (133.8 kg)  11/07/21 (!) 383 lb (173.7 kg)  09/26/21 (!) 403 lb 2 oz (182.9 kg)    Lab Results  Component Value Date   CREATININE 0.75 11/10/2021   CREATININE 0.71 11/09/2021   CREATININE 0.70 11/07/2021   Physical Exam Vitals and nursing note reviewed. Exam conducted with a chaperone present (wife).  Constitutional:      Appearance: Normal appearance. He is obese.  HENT:     Head: Normocephalic and atraumatic.  Cardiovascular:     Rate and Rhythm: Regular rhythm. Bradycardia present.  Pulmonary:     Effort: No respiratory distress.     Breath sounds: No wheezing or rales.  Abdominal:     General: There is no distension.     Palpations: Abdomen is soft.     Tenderness: There is no abdominal tenderness.  Musculoskeletal:        General: Deformity (left BKA) present.     Cervical back: Normal range of motion.     Right lower leg: No edema.  Skin:    General: Skin is  warm and dry.  Neurological:     General: No focal deficit present.     Mental Status: He is alert and oriented to person, place, and time.  Psychiatric:        Mood and Affect: Mood normal.        Behavior: Behavior normal.        Thought Content: Thought content normal.   Assessment & Plan:  1: Chronic heart failure with preserved ejection fraction with structural changes (LAE)- - NYHA class II - euvolemic today - currently not weighing daily to his concerns of falling as he still feels unsteady with his BKA; PT is supposed to start tomorrow - may not be a good candidate for SGLT2 due to body habitus and frequent fungal infections around abdominal folds and right lower leg - consider starting entresto at his next visit but they didn't bring his meds today and he's still unsteady with his BKA - does wear his CPAP inconsistently; encouraged to resume - not adding any salt to his food but did eat at [place] buffet last night to celebrate his birthday; was aware of high sodium content of these foods - BNP 10/26/21 was 276.6  2: HTN- - BP elevated (169/154) but he hasn't taken any of medications yet today because he woke up late and was feeling rushed to get to the appointment; will take them upon his return home - saw PCP ([facility]) 09/06/21; returns 03/12/22 - BMP 11/10/21 reviewed and showed sodium 137, potassium 4.8, creatinine 0.75 & GFR >60  3: DM- - saw endocrinology Honor Junes) 11/24/21 - A1c 11/24/21 was 7.3% - home glucose this morning after eating chinese food last night was 248  4: Atrial fibrillation- - saw cardiology Corky Sox) 05/31/21 - regular rhythm noted  5: PAD/ lymphedema- - had left BKA June 2023 - sees vascular Owens Shark) 02/19/22 - to start PT/OT services at home -  sent [facility] social worker a message asking her to call patient to discuss needs   Medication list reviewed.   Return in 3 months, sooner if needed.

## 2022-02-07 NOTE — Patient Instructions (Addendum)
Continue weighing daily and call for an overnight weight gain of 3 pounds or more or a weekly weight gain of more than 5 pounds.   If you have voicemail, please make sure your mailbox is cleaned out so that we may leave a message and please make sure to listen to any voicemails.     Social worker from [facility] will give you a call

## 2022-02-08 ENCOUNTER — Telehealth (HOSPITAL_COMMUNITY): Payer: Self-pay | Admitting: Licensed Clinical Social Worker

## 2022-02-08 ENCOUNTER — Encounter: Payer: Self-pay | Admitting: Family

## 2022-02-08 DIAGNOSIS — E039 Hypothyroidism, unspecified: Secondary | ICD-10-CM | POA: Diagnosis not present

## 2022-02-08 DIAGNOSIS — G4733 Obstructive sleep apnea (adult) (pediatric): Secondary | ICD-10-CM | POA: Diagnosis not present

## 2022-02-08 DIAGNOSIS — N39498 Other specified urinary incontinence: Secondary | ICD-10-CM | POA: Diagnosis not present

## 2022-02-08 DIAGNOSIS — G2581 Restless legs syndrome: Secondary | ICD-10-CM | POA: Diagnosis not present

## 2022-02-08 DIAGNOSIS — D509 Iron deficiency anemia, unspecified: Secondary | ICD-10-CM | POA: Diagnosis not present

## 2022-02-08 DIAGNOSIS — I110 Hypertensive heart disease with heart failure: Secondary | ICD-10-CM | POA: Diagnosis not present

## 2022-02-08 DIAGNOSIS — I5033 Acute on chronic diastolic (congestive) heart failure: Secondary | ICD-10-CM | POA: Diagnosis not present

## 2022-02-08 DIAGNOSIS — G4710 Hypersomnia, unspecified: Secondary | ICD-10-CM | POA: Diagnosis not present

## 2022-02-08 DIAGNOSIS — R41841 Cognitive communication deficit: Secondary | ICD-10-CM | POA: Diagnosis not present

## 2022-02-08 DIAGNOSIS — N401 Enlarged prostate with lower urinary tract symptoms: Secondary | ICD-10-CM | POA: Diagnosis not present

## 2022-02-08 DIAGNOSIS — I2510 Atherosclerotic heart disease of native coronary artery without angina pectoris: Secondary | ICD-10-CM | POA: Diagnosis not present

## 2022-02-08 DIAGNOSIS — J449 Chronic obstructive pulmonary disease, unspecified: Secondary | ICD-10-CM | POA: Diagnosis not present

## 2022-02-08 DIAGNOSIS — F339 Major depressive disorder, recurrent, unspecified: Secondary | ICD-10-CM | POA: Diagnosis not present

## 2022-02-08 DIAGNOSIS — E119 Type 2 diabetes mellitus without complications: Secondary | ICD-10-CM | POA: Diagnosis not present

## 2022-02-08 DIAGNOSIS — I480 Paroxysmal atrial fibrillation: Secondary | ICD-10-CM | POA: Diagnosis not present

## 2022-02-08 DIAGNOSIS — Z4781 Encounter for orthopedic aftercare following surgical amputation: Secondary | ICD-10-CM | POA: Diagnosis not present

## 2022-02-08 NOTE — Progress Notes (Signed)
Heart and Vascular Care Navigation  02/08/2022  [name] [birth year] [id number]  Reason for Referral: questions about Medicaid eligibility and transportation barriers   Engaged with patient by telephone for initial visit for Heart and Vascular Care Coordination.                                                                                                   Assessment:   CSW called and spoke with pt and pt wife regarding above.  Pt with recent amputation followed by SNF stay and was [facility] on Monday of this week.  At home wife is struggling to care for pt given his weight and increased care needs.  Home health will be coming out but not very frequently.  CSW assessed for possible Medicaid- pt is over 65 so would be eligible if met income and asset requirements but they currently own 2 houses that they just inherited so would be over assets.   One concern is transportation as the pt is not strong enough to transfer independently into car and the wife is not capable of safely helping him with this.  CSW informed pt and wife of likely benefits through his insurance and that they should call them to get details of benefits.  Also informed of [employer] which has wheelchair Lucianne Lei transport for reasonable costs.  Other main concern expressed at this time is caring for pt properly at home with no one who can assist with physical needs.  Encouraged pt and wife to confirm that home health aid was ordered through home health.  Informed they should call local DHHS about getting on list for In Home Aid Program for possible free hours.  Also encouraged them to consider getting private pay assistance- wife states they are working on selling extra property so they can afford assistance at home.                                   HRT/VAS Care Coordination     Patients Home Cardiology Office [facility]   Living arrangements for the past 2 months Single Family Home   Lives with: Spouse   Patient  Current Insurance Coverage Managed Medicare   Home Assistive Devices/Equipment None   DME Agency [employer]   [place] [facility]   Current home services DME       Social History:                                                                             SDOH Screenings   Transportation Needs: Unmet Transportation Needs (02/08/2022)  Depression (PHQ2-9): Low Risk  (11/21/2021)  Tobacco Use: Medium Risk (02/08/2022)    SDOH Interventions: Financial Resources:  Retirement income, two inherited homes  Food Insecurity:  None reported  Housing Insecurity:  None reported  Transportation:   Transportation Interventions: Water engineer, Other (Comment) ([facility])    Follow-up plan:    Wife and pt to follow up on resources provided- will reach out if in need of further assistance.  Jorge Ny, LCSW Clinical Social Worker [facility] Desk#: [phone] Cell#: [phone]

## 2022-02-11 ENCOUNTER — Other Ambulatory Visit: Payer: Self-pay

## 2022-02-11 ENCOUNTER — Inpatient Hospital Stay
Admission: EM | Admit: 2022-02-11 | Discharge: 2022-02-17 | DRG: 308 | Disposition: A | Discharge status: Home Health | Payer: Medicare Other | Attending: Hospitalist | Admitting: Hospitalist

## 2022-02-11 ENCOUNTER — Emergency Department: Payer: Medicare Other

## 2022-02-11 DIAGNOSIS — J9601 Acute respiratory failure with hypoxia: Secondary | ICD-10-CM | POA: Diagnosis not present

## 2022-02-11 DIAGNOSIS — Z87891 Personal history of nicotine dependence: Secondary | ICD-10-CM

## 2022-02-11 DIAGNOSIS — E119 Type 2 diabetes mellitus without complications: Secondary | ICD-10-CM | POA: Diagnosis not present

## 2022-02-11 DIAGNOSIS — E1151 Type 2 diabetes mellitus with diabetic peripheral angiopathy without gangrene: Secondary | ICD-10-CM | POA: Diagnosis not present

## 2022-02-11 DIAGNOSIS — Z955 Presence of coronary angioplasty implant and graft: Secondary | ICD-10-CM

## 2022-02-11 DIAGNOSIS — E1165 Type 2 diabetes mellitus with hyperglycemia: Secondary | ICD-10-CM | POA: Diagnosis not present

## 2022-02-11 DIAGNOSIS — I878 Other specified disorders of veins: Secondary | ICD-10-CM | POA: Diagnosis present

## 2022-02-11 DIAGNOSIS — Z20822 Contact with and (suspected) exposure to covid-19: Secondary | ICD-10-CM | POA: Diagnosis present

## 2022-02-11 DIAGNOSIS — Z6833 Body mass index (BMI) 33.0-33.9, adult: Secondary | ICD-10-CM

## 2022-02-11 DIAGNOSIS — L0390 Cellulitis, unspecified: Secondary | ICD-10-CM

## 2022-02-11 DIAGNOSIS — Z7401 Bed confinement status: Secondary | ICD-10-CM | POA: Diagnosis not present

## 2022-02-11 DIAGNOSIS — I10 Essential (primary) hypertension: Secondary | ICD-10-CM

## 2022-02-11 DIAGNOSIS — I959 Hypotension, unspecified: Secondary | ICD-10-CM | POA: Diagnosis not present

## 2022-02-11 DIAGNOSIS — I4510 Unspecified right bundle-branch block: Secondary | ICD-10-CM | POA: Diagnosis present

## 2022-02-11 DIAGNOSIS — E039 Hypothyroidism, unspecified: Secondary | ICD-10-CM

## 2022-02-11 DIAGNOSIS — G4733 Obstructive sleep apnea (adult) (pediatric): Secondary | ICD-10-CM | POA: Diagnosis present

## 2022-02-11 DIAGNOSIS — R0689 Other abnormalities of breathing: Secondary | ICD-10-CM | POA: Diagnosis not present

## 2022-02-11 DIAGNOSIS — A419 Sepsis, unspecified organism: Secondary | ICD-10-CM | POA: Diagnosis not present

## 2022-02-11 DIAGNOSIS — Z888 Allergy status to other drugs, medicaments and biological substances status: Secondary | ICD-10-CM

## 2022-02-11 DIAGNOSIS — I4819 Other persistent atrial fibrillation: Principal | ICD-10-CM | POA: Diagnosis present

## 2022-02-11 DIAGNOSIS — F32A Depression, unspecified: Secondary | ICD-10-CM | POA: Diagnosis present

## 2022-02-11 DIAGNOSIS — B961 Klebsiella pneumoniae [K. pneumoniae] as the cause of diseases classified elsewhere: Secondary | ICD-10-CM | POA: Diagnosis present

## 2022-02-11 DIAGNOSIS — Z89512 Acquired absence of left leg below knee: Secondary | ICD-10-CM | POA: Diagnosis not present

## 2022-02-11 DIAGNOSIS — I517 Cardiomegaly: Secondary | ICD-10-CM | POA: Diagnosis not present

## 2022-02-11 DIAGNOSIS — R296 Repeated falls: Secondary | ICD-10-CM | POA: Diagnosis present

## 2022-02-11 DIAGNOSIS — M109 Gout, unspecified: Secondary | ICD-10-CM | POA: Diagnosis present

## 2022-02-11 DIAGNOSIS — L03115 Cellulitis of right lower limb: Secondary | ICD-10-CM | POA: Diagnosis present

## 2022-02-11 DIAGNOSIS — Z89421 Acquired absence of other right toe(s): Secondary | ICD-10-CM | POA: Diagnosis not present

## 2022-02-11 DIAGNOSIS — I2510 Atherosclerotic heart disease of native coronary artery without angina pectoris: Secondary | ICD-10-CM | POA: Diagnosis not present

## 2022-02-11 DIAGNOSIS — M4802 Spinal stenosis, cervical region: Secondary | ICD-10-CM | POA: Diagnosis not present

## 2022-02-11 DIAGNOSIS — I110 Hypertensive heart disease with heart failure: Secondary | ICD-10-CM | POA: Diagnosis not present

## 2022-02-11 DIAGNOSIS — Z471 Aftercare following joint replacement surgery: Secondary | ICD-10-CM | POA: Diagnosis not present

## 2022-02-11 DIAGNOSIS — Z66 Do not resuscitate: Secondary | ICD-10-CM | POA: Diagnosis present

## 2022-02-11 DIAGNOSIS — J811 Chronic pulmonary edema: Secondary | ICD-10-CM | POA: Diagnosis not present

## 2022-02-11 DIAGNOSIS — R410 Disorientation, unspecified: Secondary | ICD-10-CM | POA: Diagnosis not present

## 2022-02-11 DIAGNOSIS — I509 Heart failure, unspecified: Secondary | ICD-10-CM | POA: Diagnosis not present

## 2022-02-11 DIAGNOSIS — R0902 Hypoxemia: Secondary | ICD-10-CM | POA: Diagnosis not present

## 2022-02-11 DIAGNOSIS — Z794 Long term (current) use of insulin: Secondary | ICD-10-CM

## 2022-02-11 DIAGNOSIS — Z79890 Hormone replacement therapy (postmenopausal): Secondary | ICD-10-CM

## 2022-02-11 DIAGNOSIS — Z7984 Long term (current) use of oral hypoglycemic drugs: Secondary | ICD-10-CM

## 2022-02-11 DIAGNOSIS — N401 Enlarged prostate with lower urinary tract symptoms: Secondary | ICD-10-CM | POA: Diagnosis present

## 2022-02-11 DIAGNOSIS — Z8249 Family history of ischemic heart disease and other diseases of the circulatory system: Secondary | ICD-10-CM

## 2022-02-11 DIAGNOSIS — Z7901 Long term (current) use of anticoagulants: Secondary | ICD-10-CM

## 2022-02-11 DIAGNOSIS — M7989 Other specified soft tissue disorders: Secondary | ICD-10-CM | POA: Diagnosis not present

## 2022-02-11 DIAGNOSIS — I5033 Acute on chronic diastolic (congestive) heart failure: Secondary | ICD-10-CM | POA: Diagnosis present

## 2022-02-11 DIAGNOSIS — Z951 Presence of aortocoronary bypass graft: Secondary | ICD-10-CM

## 2022-02-11 DIAGNOSIS — Z880 Allergy status to penicillin: Secondary | ICD-10-CM

## 2022-02-11 DIAGNOSIS — J4489 Other specified chronic obstructive pulmonary disease: Secondary | ICD-10-CM | POA: Diagnosis present

## 2022-02-11 DIAGNOSIS — Z89422 Acquired absence of other left toe(s): Secondary | ICD-10-CM

## 2022-02-11 DIAGNOSIS — E785 Hyperlipidemia, unspecified: Secondary | ICD-10-CM

## 2022-02-11 DIAGNOSIS — I480 Paroxysmal atrial fibrillation: Secondary | ICD-10-CM | POA: Diagnosis not present

## 2022-02-11 DIAGNOSIS — L03119 Cellulitis of unspecified part of limb: Secondary | ICD-10-CM | POA: Diagnosis present

## 2022-02-11 DIAGNOSIS — Z79899 Other long term (current) drug therapy: Secondary | ICD-10-CM

## 2022-02-11 DIAGNOSIS — I252 Old myocardial infarction: Secondary | ICD-10-CM

## 2022-02-11 DIAGNOSIS — R29898 Other symptoms and signs involving the musculoskeletal system: Secondary | ICD-10-CM | POA: Diagnosis not present

## 2022-02-11 DIAGNOSIS — S0990XA Unspecified injury of head, initial encounter: Secondary | ICD-10-CM | POA: Diagnosis not present

## 2022-02-11 DIAGNOSIS — I4891 Unspecified atrial fibrillation: Secondary | ICD-10-CM

## 2022-02-11 DIAGNOSIS — R531 Weakness: Secondary | ICD-10-CM | POA: Diagnosis present

## 2022-02-11 DIAGNOSIS — R000 Tachycardia, unspecified: Secondary | ICD-10-CM | POA: Diagnosis not present

## 2022-02-11 LAB — CBC WITH DIFFERENTIAL/PLATELET
Abs Immature Granulocytes: 0.13 10*3/uL — ABNORMAL HIGH (ref 0.00–0.07)
Basophils Absolute: 0.1 10*3/uL (ref 0.0–0.1)
Basophils Relative: 1 %
Eosinophils Absolute: 0.4 10*3/uL (ref 0.0–0.5)
Eosinophils Relative: 5 %
HCT: 39.3 % (ref 39.0–52.0)
Hemoglobin: 12 g/dL — ABNORMAL LOW (ref 13.0–17.0)
Immature Granulocytes: 2 %
Lymphocytes Relative: 15 %
Lymphs Abs: 1.2 10*3/uL (ref 0.7–4.0)
MCH: 28.7 pg (ref 26.0–34.0)
MCHC: 30.5 g/dL (ref 30.0–36.0)
MCV: 94 fL (ref 80.0–100.0)
Monocytes Absolute: 0.4 10*3/uL (ref 0.1–1.0)
Monocytes Relative: 6 %
Neutro Abs: 5.8 10*3/uL (ref 1.7–7.7)
Neutrophils Relative %: 71 %
Platelets: 199 10*3/uL (ref 150–400)
RBC: 4.18 MIL/uL — ABNORMAL LOW (ref 4.22–5.81)
RDW: 17.2 % — ABNORMAL HIGH (ref 11.5–15.5)
WBC: 8.1 10*3/uL (ref 4.0–10.5)
nRBC: 0 % (ref 0.0–0.2)

## 2022-02-11 LAB — COMPREHENSIVE METABOLIC PANEL
ALT: 20 U/L (ref 0–44)
AST: 21 U/L (ref 15–41)
Albumin: 4.1 g/dL (ref 3.5–5.0)
Alkaline Phosphatase: 142 U/L — ABNORMAL HIGH (ref 38–126)
Anion gap: 8 (ref 5–15)
BUN: 46 mg/dL — ABNORMAL HIGH (ref 8–23)
CO2: 25 mmol/L (ref 22–32)
Calcium: 9.3 mg/dL (ref 8.9–10.3)
Chloride: 109 mmol/L (ref 98–111)
Creatinine, Ser: 1.19 mg/dL (ref 0.61–1.24)
GFR, Estimated: >60 mL/min (ref >60)
Glucose, Bld: 247 mg/dL — ABNORMAL HIGH (ref 70–99)
Potassium: 4.6 mmol/L (ref 3.5–5.1)
Sodium: 142 mmol/L (ref 135–145)
Total Bilirubin: 0.5 mg/dL (ref 0.3–1.2)
Total Protein: 7.9 g/dL (ref 6.5–8.1)

## 2022-02-11 LAB — APTT: aPTT: 33 seconds (ref 24–36)

## 2022-02-11 LAB — PROTIME-INR
INR: 1.2 (ref 0.8–1.2)
Prothrombin Time: 15.3 seconds — ABNORMAL HIGH (ref 11.4–15.2)

## 2022-02-11 LAB — URINALYSIS, COMPLETE (UACMP) WITH MICROSCOPIC
Bilirubin Urine: NEGATIVE
Glucose, UA: 50 mg/dL — AB
Ketones, ur: NEGATIVE mg/dL
Nitrite: POSITIVE — AB
Protein, ur: >=300 mg/dL — AB
RBC / HPF: >50 RBC/hpf — ABNORMAL HIGH (ref 0–5)
Specific Gravity, Urine: 1.023 (ref 1.005–1.030)
WBC, UA: >50 WBC/hpf — ABNORMAL HIGH (ref 0–5)
pH: 5 (ref 5.0–8.0)

## 2022-02-11 LAB — TROPONIN I (HIGH SENSITIVITY)
Troponin I (High Sensitivity): 28 ng/L — ABNORMAL HIGH (ref <18)
Troponin I (High Sensitivity): 28 ng/L — ABNORMAL HIGH (ref <18)

## 2022-02-11 LAB — RESP PANEL BY RT-PCR (FLU A&B, COVID) ARPGX2
Influenza A by PCR: NEGATIVE
Influenza B by PCR: NEGATIVE
SARS Coronavirus 2 by RT PCR: NEGATIVE

## 2022-02-11 LAB — MAGNESIUM: Magnesium: 1.9 mg/dL (ref 1.7–2.4)

## 2022-02-11 LAB — LACTIC ACID, PLASMA
Lactic Acid, Venous: 2 mmol/L (ref 0.5–1.9)
Lactic Acid, Venous: 1.6 mmol/L (ref 0.5–1.9)

## 2022-02-11 MED ORDER — VITAMIN C 500 MG PO TABS
500.0000 mg | ORAL_TABLET | Freq: Every day | ORAL | Status: DC
  Administered 2022-02-12 – 2022-02-17 (×6): 500 mg via ORAL
  Filled 2022-02-11 (×6): qty 1

## 2022-02-11 MED ORDER — VITAMIN B-12 5000 MCG PO TBDP: 10000.0000 ug | ORAL_TABLET | Freq: Every day | ORAL | Status: DC

## 2022-02-11 MED ORDER — TRAMADOL HCL 50 MG PO TABS
50.0000 mg | ORAL_TABLET | Freq: Every day | ORAL | Status: DC | PRN
  Administered 2022-02-12: 50 mg via ORAL
  Filled 2022-02-11: qty 1

## 2022-02-11 MED ORDER — MORPHINE SULFATE (PF) 2 MG/ML IV SOLN: 2.0000 mg | INTRAVENOUS | Status: DC | PRN

## 2022-02-11 MED ORDER — SODIUM CHLORIDE 0.9 % IV SOLN
2.0000 g | INTRAVENOUS | Status: DC
  Administered 2022-02-12 – 2022-02-14 (×3): 2 g via INTRAVENOUS
  Filled 2022-02-11 (×4): qty 20

## 2022-02-11 MED ORDER — APIXABAN 5 MG PO TABS
5.0000 mg | ORAL_TABLET | Freq: Two times a day (BID) | ORAL | Status: DC
  Administered 2022-02-12 – 2022-02-17 (×12): 5 mg via ORAL
  Filled 2022-02-11 (×12): qty 1

## 2022-02-11 MED ORDER — TRAZODONE HCL 50 MG PO TABS
25.0000 mg | ORAL_TABLET | Freq: Every evening | ORAL | Status: DC | PRN
  Administered 2022-02-12 – 2022-02-13 (×2): 25 mg via ORAL
  Filled 2022-02-11 (×2): qty 1

## 2022-02-11 MED ORDER — DILTIAZEM HCL 25 MG/5ML IV SOLN
10.0000 mg | Freq: Once | INTRAVENOUS | Status: AC
  Administered 2022-02-11: 10 mg via INTRAVENOUS
  Filled 2022-02-11: qty 5

## 2022-02-11 MED ORDER — DILTIAZEM HCL ER 60 MG PO CP12
120.0000 mg | ORAL_CAPSULE | Freq: Two times a day (BID) | ORAL | Status: DC
  Administered 2022-02-12 (×3): 120 mg via ORAL
  Filled 2022-02-11 (×4): qty 2

## 2022-02-11 MED ORDER — ACETAMINOPHEN 325 MG PO TABS: 650.0000 mg | ORAL_TABLET | Freq: Four times a day (QID) | ORAL | Status: DC | PRN

## 2022-02-11 MED ORDER — ONDANSETRON HCL 4 MG/2ML IJ SOLN: 4.0000 mg | Freq: Four times a day (QID) | INTRAMUSCULAR | Status: DC | PRN

## 2022-02-11 MED ORDER — LACTATED RINGERS IV BOLUS
1000.0000 mL | Freq: Once | INTRAVENOUS | Status: AC
  Administered 2022-02-11: 1000 mL via INTRAVENOUS

## 2022-02-11 MED ORDER — LACTATED RINGERS IV BOLUS: 1000.0000 mL | Freq: Once | INTRAVENOUS | Status: DC

## 2022-02-11 MED ORDER — ACETAMINOPHEN 650 MG RE SUPP: 650.0000 mg | Freq: Four times a day (QID) | RECTAL | Status: DC | PRN

## 2022-02-11 MED ORDER — MAGNESIUM HYDROXIDE 400 MG/5ML PO SUSP: 30.0000 mL | Freq: Every day | ORAL | Status: DC | PRN

## 2022-02-11 MED ORDER — METRONIDAZOLE 500 MG/100ML IV SOLN
500.0000 mg | Freq: Once | INTRAVENOUS | Status: AC
Start: 2022-02-11 — End: 2022-02-11
  Administered 2022-02-11: 500 mg via INTRAVENOUS
  Filled 2022-02-11: qty 100

## 2022-02-11 MED ORDER — AMIODARONE HCL 200 MG PO TABS
200.0000 mg | ORAL_TABLET | Freq: Every day | ORAL | Status: DC
  Administered 2022-02-12 – 2022-02-13 (×2): 200 mg via ORAL
  Filled 2022-02-11 (×3): qty 1

## 2022-02-11 MED ORDER — VANCOMYCIN HCL IN DEXTROSE 1-5 GM/200ML-% IV SOLN
1000.0000 mg | Freq: Once | INTRAVENOUS | Status: AC
  Administered 2022-02-11: 1000 mg via INTRAVENOUS
  Filled 2022-02-11: qty 200

## 2022-02-11 MED ORDER — RISPERIDONE 0.5 MG PO TABS
0.5000 mg | ORAL_TABLET | Freq: Two times a day (BID) | ORAL | Status: DC
  Administered 2022-02-12 – 2022-02-17 (×12): 0.5 mg via ORAL
  Filled 2022-02-11 (×12): qty 1

## 2022-02-11 MED ORDER — FERROUS SULFATE 325 (65 FE) MG PO TABS
325.0000 mg | ORAL_TABLET | Freq: Every day | ORAL | Status: DC
  Administered 2022-02-12 – 2022-02-17 (×6): 325 mg via ORAL
  Filled 2022-02-11 (×6): qty 1

## 2022-02-11 MED ORDER — ASPIRIN 81 MG PO TBEC
81.0000 mg | DELAYED_RELEASE_TABLET | Freq: Every day | ORAL | Status: DC
  Administered 2022-02-12 – 2022-02-17 (×6): 81 mg via ORAL
  Filled 2022-02-11 (×6): qty 1

## 2022-02-11 MED ORDER — VITAMIN D 25 MCG (1000 UNIT) PO TABS
1000.0000 [IU] | ORAL_TABLET | Freq: Every day | ORAL | Status: DC
  Administered 2022-02-12 – 2022-02-17 (×6): 1000 [IU] via ORAL
  Filled 2022-02-11 (×6): qty 1

## 2022-02-11 MED ORDER — ONDANSETRON HCL 4 MG PO TABS: 4.0000 mg | ORAL_TABLET | Freq: Four times a day (QID) | ORAL | Status: DC | PRN

## 2022-02-11 MED ORDER — TAMSULOSIN HCL 0.4 MG PO CAPS
0.4000 mg | ORAL_CAPSULE | Freq: Every day | ORAL | Status: DC
  Administered 2022-02-12 – 2022-02-17 (×6): 0.4 mg via ORAL
  Filled 2022-02-11 (×6): qty 1

## 2022-02-11 MED ORDER — DILTIAZEM HCL-DEXTROSE 125-5 MG/125ML-% IV SOLN (PREMIX)
5.0000 mg/h | INTRAVENOUS | Status: DC
  Administered 2022-02-11: 5 mg/h via INTRAVENOUS
  Administered 2022-02-12 – 2022-02-13 (×3): 15 mg/h via INTRAVENOUS
  Filled 2022-02-11 (×6): qty 125

## 2022-02-11 MED ORDER — NYSTATIN 100000 UNIT/GM EX POWD
Freq: Two times a day (BID) | CUTANEOUS | Status: DC
  Filled 2022-02-11: qty 15

## 2022-02-11 MED ORDER — PRIMIDONE 250 MG PO TABS
250.0000 mg | ORAL_TABLET | Freq: Two times a day (BID) | ORAL | Status: DC
  Administered 2022-02-12 – 2022-02-17 (×12): 250 mg via ORAL
  Filled 2022-02-11 (×12): qty 1

## 2022-02-11 MED ORDER — ADULT MULTIVITAMIN W/MINERALS CH
1.0000 | ORAL_TABLET | Freq: Every day | ORAL | Status: DC
  Administered 2022-02-12 – 2022-02-17 (×6): 1 via ORAL
  Filled 2022-02-11 (×6): qty 1

## 2022-02-11 MED ORDER — FINASTERIDE 5 MG PO TABS
5.0000 mg | ORAL_TABLET | Freq: Every day | ORAL | Status: DC
  Administered 2022-02-12 – 2022-02-17 (×6): 5 mg via ORAL
  Filled 2022-02-11 (×6): qty 1

## 2022-02-11 MED ORDER — NITROGLYCERIN 0.4 MG SL SUBL: 0.4000 mg | SUBLINGUAL_TABLET | SUBLINGUAL | Status: DC | PRN

## 2022-02-11 MED ORDER — BUDESONIDE 0.5 MG/2ML IN SUSP
0.5000 mg | Freq: Two times a day (BID) | RESPIRATORY_TRACT | Status: DC
  Administered 2022-02-12: 0.5 mg via RESPIRATORY_TRACT
  Filled 2022-02-11 (×3): qty 2

## 2022-02-11 MED ORDER — CEFEPIME HCL 2 G IV SOLR
2.0000 g | Freq: Once | INTRAVENOUS | Status: AC
  Administered 2022-02-11: 2 g via INTRAVENOUS
  Filled 2022-02-11: qty 12.5

## 2022-02-11 MED ORDER — EZETIMIBE 10 MG PO TABS
10.0000 mg | ORAL_TABLET | Freq: Every day | ORAL | Status: DC
  Administered 2022-02-12 – 2022-02-17 (×7): 10 mg via ORAL
  Filled 2022-02-11 (×7): qty 1

## 2022-02-11 NOTE — ED Triage Notes (Signed)
Pt to ED via [facility] from home with c/o weakness and multiple falls over the last few days. Per EMS FD called multiple times over the last few days and assisted patient back to bed. Per EMS pt with L BKA, with increasing redness, per EMS FD reports worse since this AM. Per EMS pt's wife reports increasing confusion and weakness. EMS reports pt was seen in June for A-fib, sent to [facility] for rehab, sent home on risperadal and another anxiety med that he was not previously on. Seen by cardiologist this past Thurs and "everything was fine".

## 2022-02-11 NOTE — ED Notes (Signed)
Pt taken to CT.

## 2022-02-11 NOTE — Assessment & Plan Note (Signed)
--  received vanc/cefe/flagyl on presentation --cont vanc and ceftriaxone for now

## 2022-02-11 NOTE — Progress Notes (Signed)
Pt being followed by [facility] for Sepsis protocol. 

## 2022-02-11 NOTE — Assessment & Plan Note (Addendum)
-   We will continue Zetia. 

## 2022-02-11 NOTE — ED Notes (Signed)
Pt placed on 2L of O2 via nasal cannula because his saturation decreases while sleeping. Sat increased to 94% on O2

## 2022-02-11 NOTE — Assessment & Plan Note (Signed)
-    continue Synthroid. 

## 2022-02-11 NOTE — Assessment & Plan Note (Addendum)
-   continue Proscar and Flomax.

## 2022-02-11 NOTE — H&P (Signed)
PATIENT NAME: [name]    MR#:  [id number]  DATE OF BIRTH:  1955-11-02  DATE OF ADMISSION:  02/11/2022  PRIMARY CARE PHYSICIAN: [name], MD   Patient is coming from: Home  REQUESTING/REFERRING PHYSICIAN: Blake Divine, MD CHIEF COMPLAINT:  Weakness Multiple falls  HISTORY OF PRESENT ILLNESS:  Buel Molder. is a 66 y.o. Caucasian male with medical history significant for asthma, CHF, COPD, coronary artery disease, left BKA, depression, type 2 diabetes mellitus, gout, hypertension, dyslipidemia, OSA, who presented to the ER with acute onset of falls and generalized weakness.  Per EMS the fire department has had to come to the patient's residence about 6 times over the last 48 hours due to recurrent falls.  He has been slipping off of his commode as well as his wheel chair on multiple occasions.  No loss of consciousness or head injury or associated with those falls.  He has subsequent skin abrasions and wounds to his left BKA stump as well as right foot and ankle as result of his falls.  He was noted to be tachycardic with irregularly irregular rhythm before coming to the ER.  He was in atrial fibrillation with rapid ventricular sponsor.  He denies any chest pain or palpitations though.  He has been compliant with his medications.  No fever or chills.  No dyspnea or cough or hemoptysis.  No nausea or vomiting or abdominal pain.  No dysuria, oliguria or hematuria or flank pain. . ED Course: Upon presentation to the ER, BP was 104/93 with heart rate of 144 with respiratory rate of 23.  Pulse oximetry was within normal and later 89% on room air and 94 to 99% on 2 L of O2 by nasal cannula.  Labs reveal hyperglycemia of 247 and BUN of 46 and alk phos 142.  Lactic acid was 2 and high sensitive troponin was 28.  CBC showed hemoglobin of 12 and hematocrit 39.3.  INR was 1.2 and PT 15.3 with PTT of 33.  Influenza antigens and COVID-19 PCR came back  negative.  Blood cultures were drawn. EKG as reviewed by me : EKG showed atrial fibrillation with rapid ventricular sponsor of 147 with right bundle branch block Imaging: Right ankle x-ray showed no acute bony abnormality.  Portable chest ray showed cardiomegaly with no active disease.  2 view right foot x-ray showed no acute bony abnormality.  Noncontrasted CT scan revealed chronic microvascular ischemic changes of the white matter with no acute abnormality.  C-spine CT showed moderate to severe multilevel degenerative disc disease and joint arthropathy with severe neuroforaminal stenosis at C6-C7 and C7-T1, aortic atherosclerosis and no acute fracture or traumatic subluxation.  The patient was given IV Flagyl, cefepime and vancomycin, 1 L bolus of IV lactated Ringer, 10 mg of IV Cardizem twice followed by IV Cardizem drip.  He will be admitted to a progressive unit bed for further evaluation and management PAST MEDICAL HISTORY:   Past Medical History:  Diagnosis Date   Arrhythmia    atrial fibrillation   Asthma    CHF (congestive heart failure) (HCC)    COPD (chronic obstructive pulmonary disease) (HCC)    Coronary artery disease    Depression    Diabetes mellitus without complication ([place])    Gout    History anabolic steroid use    Hyperlipidemia    Hypertension    Hypogonadism in male    Morbid obesity ([place])  Myocardial infarction ([place])    Peripheral vascular disease ([place])    Perirectal abscess    Pleurisy    Sleep apnea    CPAP at night, no oxygen   Varicella     PAST SURGICAL HISTORY:   Past Surgical History:  Procedure Laterality Date   ABDOMINAL AORTIC ANEURYSM REPAIR     ACHILLES TENDON SURGERY Left 01/10/2021   Procedure: ACHILLES LENGTHENING/KIDNER;  Surgeon: Caroline More, DPM;  Location: [facility] ORS;  Service: Podiatry;  Laterality: Left;   AMPUTATION Left 10/03/2021   Procedure: AMPUTATION BELOW KNEE;  Surgeon: Katha Cabal, MD;  Location: [facility];  Service:  Vascular;  Laterality: Left;   AMPUTATION TOE Right 02/10/2016   Procedure: AMPUTATION TOE 3RD TOE;  Surgeon: Samara Deist, DPM;  Location: [facility];  Service: Podiatry;  Laterality: Right;   AMPUTATION TOE Left 02/24/2020   Procedure: AMPUTATION TOE;  Surgeon: Caroline More, DPM;  Location: [facility];  Service: Podiatry;  Laterality: Left;   APPLICATION OF WOUND VAC Left 02/29/2020   Procedure: APPLICATION OF WOUND VAC;  Surgeon: Caroline More, DPM;  Location: [facility];  Service: Podiatry;  Laterality: Left;   COLONOSCOPY WITH PROPOFOL [name] 11/18/2015   Procedure: COLONOSCOPY WITH PROPOFOL;  Surgeon: Manya Silvas, MD;  Location: [facility];  Service: Endoscopy;  Laterality: [name];   CORONARY ARTERY BYPASS GRAFT     CORONARY STENT INTERVENTION [name] 02/02/2020   Procedure: CORONARY STENT INTERVENTION;  Surgeon: Isaias Cowman, MD;  Location: [place] [facility] LAB;  Service: Cardiovascular;  Laterality: [name];   INCISION AND DRAINAGE Left 08/07/2021   Procedure: INCISION AND DRAINAGE-Partial Calcanectomy;  Surgeon: Caroline More, DPM;  Location: [facility];  Service: Podiatry;  Laterality: Left;   IRRIGATION AND DEBRIDEMENT FOOT Left 02/29/2020   Procedure: IRRIGATION AND DEBRIDEMENT FOOT;  Surgeon: Caroline More, DPM;  Location: [facility];  Service: Podiatry;  Laterality: Left;   IRRIGATION AND DEBRIDEMENT FOOT Left 02/24/2020   Procedure: IRRIGATION AND DEBRIDEMENT FOOT;  Surgeon: Caroline More, DPM;  Location: [facility];  Service: Podiatry;  Laterality: Left;   KNEE ARTHROSCOPY     LEFT HEART CATH AND CORS/GRAFTS ANGIOGRAPHY [name] 02/02/2020   Procedure: LEFT HEART CATH AND CORS/GRAFTS ANGIOGRAPHY;  Surgeon: Teodoro Spray, MD;  Location: [place] [facility];  Service: Cardiovascular;  Laterality: [name];   LOWER EXTREMITY ANGIOGRAPHY Left 02/25/2020   Procedure: Lower Extremity Angiography;  Surgeon: Algernon Huxley, MD;  Location: [place] [facility];  Service: Cardiovascular;  Laterality: Left;    LOWER EXTREMITY ANGIOGRAPHY Left 01/04/2021   Procedure: LOWER EXTREMITY ANGIOGRAPHY;  Surgeon: Algernon Huxley, MD;  Location: [place] [facility];  Service: Cardiovascular;  Laterality: Left;   METATARSAL HEAD EXCISION Left 01/10/2021   Procedure: METATARSAL HEAD EXCISION - LEFT 5th;  Surgeon: Caroline More, DPM;  Location: [facility];  Service: Podiatry;  Laterality: Left;   PERIPHERAL VASCULAR CATHETERIZATION Right 01/24/2016   Procedure: Lower Extremity Angiography;  Surgeon: Katha Cabal, MD;  Location: [place] [facility];  Service: Cardiovascular;  Laterality: Right;   PERIPHERAL VASCULAR CATHETERIZATION Right 01/25/2016   Procedure: Lower Extremity Angiography;  Surgeon: Katha Cabal, MD;  Location: [place] [facility];  Service: Cardiovascular;  Laterality: Right;   TOE AMPUTATION     TONSILLECTOMY      SOCIAL HISTORY:   Social History   Tobacco Use   Smoking status: Former    Packs/day: 0.50    Years: 45.00    Total pack years: 22.50  Types: Cigarettes    Quit date: 04/05/2015    Years since quitting: 6.8   Smokeless tobacco: Never  Substance Use Topics   Alcohol use: Not Currently    Alcohol/week: 3.0 standard drinks of alcohol    Types: 3 Glasses of wine per week    FAMILY HISTORY:   Family History  Problem Relation Age of Onset   Hypertension Father    Coronary artery disease Father    Alcohol abuse Father    Heart failure Brother     DRUG ALLERGIES:   Allergies  Allergen Reactions   Penicillins Other (See Comments)    Happened at 66 years old and pt. stated he passed out  He has tolerated amoxicillin/clavulanate and ampicillin/sulbactam   Statins     Other reaction(s): Muscle Pain Causes legs to ache per pt    REVIEW OF SYSTEMS:   ROS As per history of present illness. All pertinent systems were reviewed above. Constitutional, HEENT, cardiovascular, respiratory, GI, GU, musculoskeletal, neuro, psychiatric, endocrine, integumentary and  hematologic systems were reviewed and are otherwise negative/unremarkable except for positive findings mentioned above in the HPI.   MEDICATIONS AT HOME:   Prior to Admission medications   Medication Sig Start Date End Date Taking? Authorizing Provider  acetaminophen (TYLENOL) 325 MG tablet Take 2 tablets (650 mg total) by mouth every 6 (six) hours as needed for mild pain (or Fever >/= 101). 11/10/21   Emeterio Reeve, DO  amiodarone (PACERONE) 200 MG tablet Take 200 mg by mouth daily.    [provider]  apixaban (ELIQUIS) 5 MG TABS tablet Take 1 tablet (5 mg total) by mouth 2 (two) times daily. 01/26/16   Stegmayer, Janalyn Harder, PA-C  aspirin EC 81 MG EC tablet Take 1 tablet (81 mg total) by mouth daily. Swallow whole. 05/17/21   Dessa Phi, DO  budesonide (PULMICORT) 0.5 MG/2ML nebulizer solution Take 2 mLs (0.5 mg total) by nebulization 2 (two) times daily. 11/10/21   Emeterio Reeve, DO  Cholecalciferol 25 MCG (1000 UT) tablet Take 1,000 Units by mouth daily.    [provider]  Cyanocobalamin (VITAMIN B-12) 5000 MCG TBDP Take 10,000 mcg by mouth daily.    [provider]  diltiazem (CARDIZEM SR) 120 MG 12 hr capsule Take 1 capsule (120 mg total) by mouth every 12 (twelve) hours. 11/10/21   Emeterio Reeve, DO  ezetimibe (ZETIA) 10 MG tablet Take 10 mg by mouth at bedtime.    [provider]  ferrous sulfate 325 (65 FE) MG tablet Take 325 mg by mouth daily with breakfast.    [provider]  finasteride (PROSCAR) 5 MG tablet Take 1 tablet (5 mg total) by mouth daily. 11/11/21   Emeterio Reeve, DO  insulin aspart (NOVOLOG) 100 UNIT/ML injection Inject 12 Units into the skin 3 (three) times daily with meals. 11/10/21   Emeterio Reeve, DO  insulin aspart (NOVOLOG) 100 UNIT/ML injection Inject 0-20 Units into the skin 3 (three) times daily with meals. 11/10/21   Emeterio Reeve, DO  insulin glargine-yfgn (SEMGLEE) 100 UNIT/ML injection  Inject 0.25 mLs (25 Units total) into the skin at bedtime. 11/10/21   Emeterio Reeve, DO  levothyroxine (SYNTHROID) 25 MCG tablet Take 1 tablet (25 mcg total) by mouth daily at 6 (six) AM. 11/11/21   Emeterio Reeve, DO  metFORMIN (GLUCOPHAGE) 1000 MG tablet Take 1,000 mg by mouth 2 (two) times daily.  12/02/17   [provider]  Multiple Vitamins-Minerals (MULTIVITAMIN WITH MINERALS) tablet Take 1  tablet by mouth daily.    [provider]  nitroGLYCERIN (NITROSTAT) 0.4 MG SL tablet Place 0.4 mg under the tongue every 5 (five) minutes as needed for chest pain.    [provider]  nystatin (MYCOSTATIN/NYSTOP) powder Apply topically 2 (two) times daily. 11/10/21   Emeterio Reeve, DO  primidone (MYSOLINE) 250 MG tablet Take 250 mg by mouth 2 (two) times daily. 01/23/20   [provider]  risperiDONE (RISPERDAL) 0.5 MG tablet Take 1 tablet (0.5 mg total) by mouth 2 (two) times daily. 11/10/21   Emeterio Reeve, DO  tamsulosin (FLOMAX) 0.4 MG CAPS capsule Take 0.4 mg by mouth daily.    [provider]  traMADol (ULTRAM) 50 MG tablet Take 1 tablet (50 mg total) by mouth daily as needed for moderate pain. 08/11/21   Fritzi Mandes, MD  vitamin C (ASCORBIC ACID) 500 MG tablet Take 500 mg by mouth daily.    [provider]      VITAL SIGNS:  Blood pressure (!) 139/99, pulse (!) 130, temperature 97.9 F (36.6 C), temperature source Oral, resp. rate (!) 22, height '6\' 4"'  (1.93 m), weight 123.4 kg, SpO2 98 %.  PHYSICAL EXAMINATION:  Physical Exam  GENERAL:  66 y.o.-year-old Caucasian patient lying in the bed with no acute distress.  EYES: Pupils equal, round, reactive to light and accommodation. No scleral icterus. Extraocular muscles intact.  HEENT: Head atraumatic, normocephalic. Oropharynx and nasopharynx clear.  NECK:  Supple, no jugular venous distention. No thyroid enlargement, no tenderness.  LUNGS: Normal breath sounds bilaterally, no  wheezing, rales,rhonchi or crepitation. No use of accessory muscles of respiration.  CARDIOVASCULAR: Regular rate and rhythm, S1, S2 normal. No murmurs, rubs, or gallops.  ABDOMEN: Soft, nondistended, nontender. Bowel sounds present. No organomegaly or mass.  EXTREMITIES: No pedal edema, cyanosis, or clubbing.  NEUROLOGIC: Cranial nerves II through XII are intact. Muscle strength 5/5 in all extremities. Sensation intact. Gait not checked.  PSYCHIATRIC: The patient is alert and oriented x 3.  Normal affect and good eye contact. SKIN: Right leg erythema with warmth, and swelling extending from the mid shin to the right ankle with multiple small abrasions and associated tenderness with no fluctuation or drainage.  He has swelling in erythema to the left big toe with skin abrasion and mild tenderness.  His left BKA stump had erythema with no warmth or tenderness.      LABORATORY PANEL:   CBC Recent Labs  Lab 02/11/22 1934  WBC 8.1  HGB 12.0*  HCT 39.3  PLT 199   ------------------------------------------------------------------------------------------------------------------  Chemistries  Recent Labs  Lab 02/11/22 1934  NA 142  K 4.6  CL 109  CO2 25  GLUCOSE 247*  BUN 46*  CREATININE 1.19  CALCIUM 9.3  MG 1.9  AST 21  ALT 20  ALKPHOS 142*  BILITOT 0.5   ------------------------------------------------------------------------------------------------------------------  Cardiac Enzymes No results for input(s): "TROPONINI" in the last 168 hours. ------------------------------------------------------------------------------------------------------------------  RADIOLOGY:  CT Head Wo Contrast  Result Date: 02/11/2022 CLINICAL DATA:  Head and neck trauma. Multiple falls over the past few days. EXAM: CT HEAD WITHOUT CONTRAST CT CERVICAL SPINE WITHOUT CONTRAST TECHNIQUE: Multidetector CT imaging of the head and cervical spine was performed following the standard protocol  without intravenous contrast. Multiplanar CT image reconstructions of the cervical spine were also generated. RADIATION DOSE REDUCTION: This exam was performed according to the departmental dose-optimization program which includes automated exposure control, adjustment of the mA and/or kV according to patient size and/or  use of iterative reconstruction technique. COMPARISON:  None Available. FINDINGS: CT HEAD FINDINGS Brain: No evidence of acute infarction, hemorrhage, hydrocephalus, extra-axial collection or mass lesion/mass effect. Scattered areas of low attenuation suggesting chronic microvascular ischemic changes. Vascular: No hyperdense vessel or unexpected calcification. Skull: Normal. Negative for fracture or focal lesion. Sinuses/Orbits: No acute finding. Other: None. CT CERVICAL SPINE FINDINGS Alignment: Straightening of the cervical spine. Skull base and vertebrae: No acute fracture. No primary bone lesion or focal pathologic process. Soft tissues and spinal canal: No prevertebral fluid or swelling. No visible canal hematoma. Disc levels: C2-C3:  No significant findings. C3-C4: Disc height loss and uncovertebral joint arthropathy with moderate bilateral neural foraminal stenosis. C4-C5: Mild uncovertebral joint arthropathy with mild right neural foraminal stenosis. C5-C6:  No significant findings. C6-C7: Disc height loss and severe uncovertebral joint arthropathy with moderate left and severe right neural foraminal stenosis. C7-T1: Disc height loss and uncovertebral joint arthropathy with severe bilateral neural foraminal stenosis. Upper chest: Prominent atherosclerotic calcification of aorta and branch vessels. Other: None IMPRESSION: CT HEAD: 1. No acute intracranial abnormality. 2. Chronic microvascular ischemic changes of the white matter. CT CERVICAL SPINE: 1. No acute fracture or traumatic subluxation. 2. Moderate to severe multilevel degenerative disc disease and uncovertebral joint arthropathy with  severe neural foraminal stenosis at C6-C7 and C7-T1. 3. Aortic atherosclerosis. Electronically Signed   By: Keane Police D.O.   On: 02/11/2022 20:58   CT Cervical Spine Wo Contrast  Result Date: 02/11/2022 CLINICAL DATA:  Head and neck trauma. Multiple falls over the past few days. EXAM: CT HEAD WITHOUT CONTRAST CT CERVICAL SPINE WITHOUT CONTRAST TECHNIQUE: Multidetector CT imaging of the head and cervical spine was performed following the standard protocol without intravenous contrast. Multiplanar CT image reconstructions of the cervical spine were also generated. RADIATION DOSE REDUCTION: This exam was performed according to the departmental dose-optimization program which includes automated exposure control, adjustment of the mA and/or kV according to patient size and/or use of iterative reconstruction technique. COMPARISON:  None Available. FINDINGS: CT HEAD FINDINGS Brain: No evidence of acute infarction, hemorrhage, hydrocephalus, extra-axial collection or mass lesion/mass effect. Scattered areas of low attenuation suggesting chronic microvascular ischemic changes. Vascular: No hyperdense vessel or unexpected calcification. Skull: Normal. Negative for fracture or focal lesion. Sinuses/Orbits: No acute finding. Other: None. CT CERVICAL SPINE FINDINGS Alignment: Straightening of the cervical spine. Skull base and vertebrae: No acute fracture. No primary bone lesion or focal pathologic process. Soft tissues and spinal canal: No prevertebral fluid or swelling. No visible canal hematoma. Disc levels: C2-C3:  No significant findings. C3-C4: Disc height loss and uncovertebral joint arthropathy with moderate bilateral neural foraminal stenosis. C4-C5: Mild uncovertebral joint arthropathy with mild right neural foraminal stenosis. C5-C6:  No significant findings. C6-C7: Disc height loss and severe uncovertebral joint arthropathy with moderate left and severe right neural foraminal stenosis. C7-T1: Disc height loss  and uncovertebral joint arthropathy with severe bilateral neural foraminal stenosis. Upper chest: Prominent atherosclerotic calcification of aorta and branch vessels. Other: None IMPRESSION: CT HEAD: 1. No acute intracranial abnormality. 2. Chronic microvascular ischemic changes of the white matter. CT CERVICAL SPINE: 1. No acute fracture or traumatic subluxation. 2. Moderate to severe multilevel degenerative disc disease and uncovertebral joint arthropathy with severe neural foraminal stenosis at C6-C7 and C7-T1. 3. Aortic atherosclerosis. Electronically Signed   By: Keane Police D.O.   On: 02/11/2022 20:58   DG Ankle Complete Right  Result Date: 02/11/2022 CLINICAL DATA:  Fall, swelling EXAM: RIGHT ANKLE -  COMPLETE 3+ VIEW COMPARISON:  None Available. FINDINGS: Plantar and posterior calcaneal spurs. No acute bony abnormality. Specifically, no fracture, subluxation, or dislocation. Soft tissues are intact. IMPRESSION: No acute bony abnormality. Electronically Signed   By: Rolm Baptise M.D.   On: 02/11/2022 20:08   DG Foot 2 Views Right  Result Date: 02/11/2022 CLINICAL DATA:  Weakness, fall EXAM: RIGHT FOOT - 2 VIEW COMPARISON:  None Available. FINDINGS: Prior amputation of the 2nd and 3rd toes. No acute bony abnormality. Specifically, no fracture, subluxation, or dislocation. Soft tissues are intact. No soft tissue gas. Plantar and posterior calcaneal spur. IMPRESSION: No acute bony abnormality. Electronically Signed   By: Rolm Baptise M.D.   On: 02/11/2022 20:07   DG Chest Port 1 View  Result Date: 02/11/2022 CLINICAL DATA:  Questionable sepsis. EXAM: PORTABLE CHEST 1 VIEW COMPARISON:  10/25/2021 FINDINGS: Prior CABG. Cardiomegaly. No confluent opacities, effusions or edema. No acute bony abnormality. IMPRESSION: Cardiomegaly.  No active disease. Electronically Signed   By: Rolm Baptise M.D.   On: 02/11/2022 20:07      IMPRESSION AND PLAN:  Assessment and Plan: * Sepsis due to cellulitis  [facility]) - This is sepsis secondary to right lower extremity cellulitis. - Sepsis is manifested by tachycardia and tachypnea. - We will continue antibiotic therapy with IV Rocephin and vancomycin. - We will follow blood cultures. - Pain management will be provided.  Uncontrolled type 2 diabetes mellitus with hyperglycemia, with long-term current use of insulin (HCC) - We will continue basal coverage. - We will hold off metformin. - He will be placed on supplement coverage with NovoLog.  Atrial fibrillation with rapid ventricular response ([place]) - The patient will be continued on IV Cardizem drip. - We will resume his amiodarone and Eliquis. - Cardiology consult will be obtained. - Dr. Clayborn Bigness was notified about the patient.  Essential hypertension - We will continue his antihypertensives while monitoring blood pressure on IV Cardizem drip  Benign prostatic hyperplasia with lower urinary tract symptoms - We will continue Proscar and Flomax.  Hypothyroidism - We will continue Synthroid and check TSH level.  Dyslipidemia - We will continue Zetia.    DVT prophylaxis: Eliquis Advanced Care Planning:  Code Status: The patient is DNR/DNI.  This was discussed with him.  Family Communication:  The plan of care was discussed in details with the patient (and family). I answered all questions. The patient agreed to proceed with the above mentioned plan. Further management will depend upon hospital course. Disposition Plan: Back to previous home environment Consults called: Cardiology All the records are reviewed and case discussed with ED provider.  Status is: Inpatient    At the time of the admission, it appears that the appropriate admission status for this patient is inpatient.  This is judged to be reasonable and necessary in order to provide the required intensity of service to ensure the patient's safety given the presenting symptoms, physical exam findings and initial radiographic  and laboratory data in the context of comorbid conditions.  The patient requires inpatient status due to high intensity of service, high risk of further deterioration and high frequency of surveillance required.  I certify that at the time of admission, it is my clinical judgment that the patient will require inpatient hospital care extending more than 2 midnights.                            Dispo: The patient is from:  Home              Anticipated d/c is to: Home              Patient currently is not medically stable to d/c.              Difficult to place patient: No  Christel Mormon M.D on 02/12/2022 at 1:15 AM  [facility]   From 7 PM-7 AM, contact night-coverage [email or web address]  CC: Primary care physician; [name], MD

## 2022-02-11 NOTE — Progress Notes (Signed)
PHARMACY -  BRIEF ANTIBIOTIC NOTE   Pharmacy has received consult(s) for Cefepime and Vancomycin from an ED provider.  The patient's profile has been reviewed for ht/wt/allergies/indication/available labs.    One time order(s) placed for Cefepime 2g and Vancomycin 1g by ED provider  Further antibiotics/pharmacy consults should be ordered by admitting physician if indicated.                       Thank you, Vira Blanco 02/11/2022  7:35 PM

## 2022-02-11 NOTE — ED Provider Notes (Signed)
[facility] Provider Note    Event Date/Time   First MD Initiated Contact with Patient 02/11/22 1903     (approximate)   History   Chief Complaint Weakness, Multiple Falls  HPI  [name]. is a 66 y.o. male with past medical history of hypertension, CAD, CHF, atrial fibrillation on Eliquis, diabetes, and left BKA who presents to the ED complaining of multiple falls.  Per EMS, fire department has had to come to the patient's residence about 6 times in the past 48 hours due to falls.  They state that the patient has slipped off of the commode as well as out of his wheelchair on multiple occasions.  Patient denies hitting his head or losing consciousness, denies any pain associated with these falls.  He states he has been feeling well and denies any complaints.  Per EMS, he has suffered multiple scrapes and small wounds to left BKA stump as well as right foot and ankle as a result of the falls.  Patient noted to be tachycardic with irregular rhythm prior to arrival, states he has been taking his medications as prescribed.  He denies any fevers, cough, chest pain, shortness of breath, nausea, vomiting, diarrhea, or dysuria.     Physical Exam   Triage Vital Signs: ED Triage Vitals  Enc Vitals Group     BP      Pulse      Resp      Temp      Temp src      SpO2      Weight      Height      Head Circumference      Peak Flow      Pain Score      Pain Loc      Pain Edu?      Excl. in [place]?     Most recent vital signs: Vitals:   02/11/22 2122 02/11/22 2135  BP:  122/88  Pulse: (!) 104 (!) 101  Resp: 18 (!) 21  Temp:    SpO2: 99% 98%    Constitutional: Alert and oriented. Eyes: Conjunctivae are normal. Head: Atraumatic. Neck: No midline cervical spine tenderness to palpation. Nose: No congestion/rhinnorhea. Mouth/Throat: Mucous membranes are moist.  Cardiovascular: Tachycardic, irregularly irregular rhythm. Grossly normal heart sounds.   2+ radial pulses bilaterally.  2+ DP pulse on right. Respiratory: Normal respiratory effort.  No retractions. Lungs CTAB. Gastrointestinal: Soft and nontender. No distention. Musculoskeletal: Erythema and warmth noted circumferentially from mid shin on right through right ankle with multiple small scrapes and tenderness, no purulent drainage noted.  Left BKA stump with erythema, no warmth or tenderness noted. Neurologic:  Normal speech and language. No gross focal neurologic deficits are appreciated.    ED Results / Procedures / Treatments   Labs (all labs ordered are listed, but only abnormal results are displayed) Labs Reviewed  LACTIC ACID, PLASMA - Abnormal; Notable for the following components:      Result Value   Lactic Acid, Venous 2.0 (*)    All other components within normal limits  COMPREHENSIVE METABOLIC PANEL - Abnormal; Notable for the following components:   Glucose, Bld 247 (*)    BUN 46 (*)    Alkaline Phosphatase 142 (*)    All other components within normal limits  CBC WITH DIFFERENTIAL/PLATELET - Abnormal; Notable for the following components:   RBC 4.18 (*)    Hemoglobin 12.0 (*)    RDW 17.2 (*)  Abs Immature Granulocytes 0.13 (*)    All other components within normal limits  PROTIME-INR - Abnormal; Notable for the following components:   Prothrombin Time 15.3 (*)    All other components within normal limits  TROPONIN I (HIGH SENSITIVITY) - Abnormal; Notable for the following components:   Troponin I (High Sensitivity) 28 (*)    All other components within normal limits  RESP PANEL BY RT-PCR (FLU A&B, COVID) [id number]  CULTURE, BLOOD (ROUTINE X 2)  CULTURE, BLOOD (ROUTINE X 2)  URINE CULTURE  APTT  MAGNESIUM  URINALYSIS, COMPLETE (UACMP) WITH MICROSCOPIC  LACTIC ACID, PLASMA  LACTIC ACID, PLASMA  TROPONIN I (HIGH SENSITIVITY)     EKG  ED ECG REPORT I, Blake Divine, the attending physician, personally viewed and interpreted this ECG.   Date:  02/11/2022  EKG Time: 20:04  Rate: 147  Rhythm: atrial fibrillation  Axis: Normal  Intervals:right bundle branch block  ST&T Change: None  RADIOLOGY Chest x-ray reviewed and interpreted by me with no infiltrate, edema, or effusion.  PROCEDURES:  Critical Care performed: No  .Critical Care  Performed by: Blake Divine, MD Authorized by: Blake Divine, MD   Critical care provider statement:    Critical care time (minutes):  30   Critical care time was exclusive of:  Separately billable procedures and treating other patients and teaching time   Critical care was necessary to treat or prevent imminent or life-threatening deterioration of the following conditions:  Cardiac failure and sepsis   Critical care was time spent personally by me on the following activities:  Development of treatment plan with patient or surrogate, discussions with consultants, evaluation of patient's response to treatment, examination of patient, ordering and review of laboratory studies, ordering and review of radiographic studies, ordering and performing treatments and interventions, pulse oximetry, re-evaluation of patient's condition and review of old charts   I assumed direction of critical care for this patient from another provider in my specialty: no     Care discussed with: admitting provider      [place] ED: Medications  metroNIDAZOLE (FLAGYL) IVPB 500 mg (500 mg Intravenous New Bag/Given 02/11/22 2125)  vancomycin (VANCOCIN) IVPB 1000 mg/200 mL premix (has no administration in time range)  diltiazem (CARDIZEM) 125 mg in dextrose 5% 125 mL (1 mg/mL) infusion (15 mg/hr Intravenous Rate/Dose Change 02/11/22 2127)  ceFEPIme (MAXIPIME) 2 g in sodium chloride 0.9 % 100 mL IVPB (0 g Intravenous Stopped 02/11/22 2106)  diltiazem (CARDIZEM) injection 10 mg (10 mg Intravenous Given 02/11/22 2009)  lactated ringers bolus 1,000 mL (1,000 mLs Intravenous New Bag/Given 02/11/22 2020)   diltiazem (CARDIZEM) injection 10 mg (10 mg Intravenous Given 02/11/22 2138)     IMPRESSION / MDM / [place] / ED COURSE  I reviewed the triage vital signs and the nursing notes.                              66 y.o. male with past medical history of hypertension, CAD, diabetes, CHF, atrial fibrillation on Eliquis, COPD, and left BKA who presents to the ED complaining of increasing generalized weakness with multiple falls over the past 2 days.  Patient's presentation is most consistent with acute presentation with potential threat to life or bodily function.  Differential diagnosis includes, but is not limited to, sepsis, UTI, pneumonia, cellulitis, abscess, intracranial injury, cervical spine injury, extremity injury, atrial fibrillation with RVR, ACS, PE, electrolyte abnormality, AKI.  Patient chronically ill-appearing but in no acute distress, vital signs remarkable for tachycardia but otherwise reassuring.  He is noted to be in atrial fibrillation with RVR, no ischemic changes noted on EKG.  I am also concerned for sepsis with tachycardia and apparent cellulitis affecting his right lower extremity.  He remains neurovascular intact to his right lower extremity, given multiple falls we will check x-rays of his foot and ankle.  We will also check CT imaging of his head and cervical spine, there was some concern for altered mental status per EMS, however patient currently alert and oriented x4 with no focal neurologic deficits.  We will hydrate with IV fluids and start on diltiazem drip for rate control, also start broad-spectrum antibiotics for cellulitis.  Heart rate gradually improving following diltiazem bolus and drip, will give follow-up bolus of 10 mg for ongoing tachycardia.  CT head and cervical spine are negative for acute traumatic injury or other acute process, x-rays of his right foot and ankle also are negative for traumatic injury.  Patient received broad-spectrum  antibiotics for sepsis, although he does not have any leukocytosis and remains afebrile, antibiotics may likely be tailored toward cellulitis at this point.  He does have an elevated lactic acid at 2.0 which we will trend following IV fluids.  Additional labs are reassuring with no significant anemia, leukocytosis, electrolyte abnormality, or AKI.  Troponin mildly elevated but low suspicion for ACS and this is likely rate related.  Case discussed with hospitalist for admission.      FINAL CLINICAL IMPRESSION(S) / ED DIAGNOSES   Final diagnoses:  Atrial fibrillation with RVR ([place])  Sepsis without acute organ dysfunction, due to unspecified organism [facility])  Cellulitis of right lower extremity     Rx / DC Orders   ED Discharge Orders     None        Note:  This document was prepared using Dragon voice recognition software and may include unintentional dictation errors.   Blake Divine, MD 02/11/22 2149

## 2022-02-11 NOTE — Assessment & Plan Note (Signed)
Not currently active.  BP soft.

## 2022-02-11 NOTE — Assessment & Plan Note (Addendum)
--  started on dilt gtt and amiodarone gtt, and oral of both on admission --cardio consulted --maxed out on both dilt and amio gtt this morning and still in Afib w HR 130's.  BP also soft. Plan: --cardioversion today, with conversion to NSR, however, still tachycardic --cont IV and oral amio  --cont dilt gtt --d/c oral dilt due to hypotension --cont Eliquis

## 2022-02-11 NOTE — Assessment & Plan Note (Addendum)
--  SSI --start glargine 12u nightly

## 2022-02-12 DIAGNOSIS — I4891 Unspecified atrial fibrillation: Secondary | ICD-10-CM | POA: Diagnosis not present

## 2022-02-12 LAB — CBC
HCT: 37.7 % — ABNORMAL LOW (ref 39.0–52.0)
Hemoglobin: 11.4 g/dL — ABNORMAL LOW (ref 13.0–17.0)
MCH: 28.5 pg (ref 26.0–34.0)
MCHC: 30.2 g/dL (ref 30.0–36.0)
MCV: 94.3 fL (ref 80.0–100.0)
Platelets: 155 10*3/uL (ref 150–400)
RBC: 4 MIL/uL — ABNORMAL LOW (ref 4.22–5.81)
RDW: 17.4 % — ABNORMAL HIGH (ref 11.5–15.5)
WBC: 7.1 10*3/uL (ref 4.0–10.5)
nRBC: 0 % (ref 0.0–0.2)

## 2022-02-12 LAB — CORTISOL-AM, BLOOD: Cortisol - AM: 15 ug/dL (ref 6.7–22.6)

## 2022-02-12 LAB — BASIC METABOLIC PANEL
Anion gap: 6 (ref 5–15)
BUN: 41 mg/dL — ABNORMAL HIGH (ref 8–23)
CO2: 24 mmol/L (ref 22–32)
Calcium: 8.6 mg/dL — ABNORMAL LOW (ref 8.9–10.3)
Chloride: 110 mmol/L (ref 98–111)
Creatinine, Ser: 0.91 mg/dL (ref 0.61–1.24)
GFR, Estimated: >60 mL/min (ref >60)
Glucose, Bld: 229 mg/dL — ABNORMAL HIGH (ref 70–99)
Potassium: 4.3 mmol/L (ref 3.5–5.1)
Sodium: 140 mmol/L (ref 135–145)

## 2022-02-12 LAB — PROTIME-INR
INR: 1.4 — ABNORMAL HIGH (ref 0.8–1.2)
Prothrombin Time: 16.7 seconds — ABNORMAL HIGH (ref 11.4–15.2)

## 2022-02-12 LAB — GLUCOSE, CAPILLARY: Glucose-Capillary: 200 mg/dL — ABNORMAL HIGH (ref 70–99)

## 2022-02-12 LAB — CBG MONITORING, ED: Glucose-Capillary: 231 mg/dL — ABNORMAL HIGH (ref 70–99)

## 2022-02-12 LAB — LACTIC ACID, PLASMA: Lactic Acid, Venous: 1.5 mmol/L (ref 0.5–1.9)

## 2022-02-12 LAB — TSH: TSH: 2.14 u[IU]/mL (ref 0.350–4.500)

## 2022-02-12 LAB — PROCALCITONIN: Procalcitonin: <0.1 ng/mL

## 2022-02-12 MED ORDER — LEVOTHYROXINE SODIUM 25 MCG PO TABS
25.0000 ug | ORAL_TABLET | Freq: Every day | ORAL | Status: DC
  Administered 2022-02-13 – 2022-02-17 (×4): 25 ug via ORAL
  Filled 2022-02-12 (×5): qty 1

## 2022-02-12 MED ORDER — AMIODARONE LOAD VIA INFUSION
150.0000 mg | Freq: Once | INTRAVENOUS | Status: AC
  Administered 2022-02-12: 150 mg via INTRAVENOUS
  Filled 2022-02-12: qty 83.34

## 2022-02-12 MED ORDER — INSULIN ASPART 100 UNIT/ML IJ SOLN
0.0000 [IU] | Freq: Every day | INTRAMUSCULAR | Status: DC
  Administered 2022-02-13 – 2022-02-16 (×3): 2 [IU] via SUBCUTANEOUS
  Filled 2022-02-12 (×3): qty 1

## 2022-02-12 MED ORDER — VANCOMYCIN HCL 1500 MG/300ML IV SOLN
1500.0000 mg | Freq: Once | INTRAVENOUS | Status: AC
  Administered 2022-02-12: 1500 mg via INTRAVENOUS
  Filled 2022-02-12: qty 300

## 2022-02-12 MED ORDER — AMIODARONE HCL IN DEXTROSE 360-4.14 MG/200ML-% IV SOLN
30.0000 mg/h | INTRAVENOUS | Status: DC
  Administered 2022-02-13 – 2022-02-14 (×3): 30 mg/h via INTRAVENOUS
  Filled 2022-02-12 (×4): qty 200

## 2022-02-12 MED ORDER — AMIODARONE HCL IN DEXTROSE 360-4.14 MG/200ML-% IV SOLN
60.0000 mg/h | INTRAVENOUS | Status: DC
  Administered 2022-02-12: 60 mg/h via INTRAVENOUS
  Filled 2022-02-12 (×2): qty 200

## 2022-02-12 MED ORDER — INSULIN ASPART 100 UNIT/ML IJ SOLN
0.0000 [IU] | Freq: Three times a day (TID) | INTRAMUSCULAR | Status: DC
  Administered 2022-02-12 – 2022-02-13 (×2): 5 [IU] via SUBCUTANEOUS
  Administered 2022-02-13 – 2022-02-14 (×3): 3 [IU] via SUBCUTANEOUS
  Administered 2022-02-14: 8 [IU] via SUBCUTANEOUS
  Administered 2022-02-15: 5 [IU] via SUBCUTANEOUS
  Administered 2022-02-15: 2 [IU] via SUBCUTANEOUS
  Administered 2022-02-15: 3 [IU] via SUBCUTANEOUS
  Administered 2022-02-16: 5 [IU] via SUBCUTANEOUS
  Administered 2022-02-16: 8 [IU] via SUBCUTANEOUS
  Administered 2022-02-16: 2 [IU] via SUBCUTANEOUS
  Administered 2022-02-17: 3 [IU] via SUBCUTANEOUS
  Administered 2022-02-17: 8 [IU] via SUBCUTANEOUS
  Filled 2022-02-12 (×14): qty 1

## 2022-02-12 MED ORDER — VANCOMYCIN HCL IN DEXTROSE 1-5 GM/200ML-% IV SOLN
1000.0000 mg | Freq: Two times a day (BID) | INTRAVENOUS | Status: DC
  Administered 2022-02-12: 1000 mg via INTRAVENOUS
  Filled 2022-02-12: qty 200

## 2022-02-12 MED ORDER — VANCOMYCIN HCL 1250 MG/250ML IV SOLN
1250.0000 mg | Freq: Two times a day (BID) | INTRAVENOUS | Status: DC
  Administered 2022-02-12: 1250 mg via INTRAVENOUS
  Filled 2022-02-12 (×2): qty 250

## 2022-02-12 NOTE — Assessment & Plan Note (Signed)
PT/OT

## 2022-02-12 NOTE — ED Notes (Signed)
Patient eating meal tray

## 2022-02-12 NOTE — Progress Notes (Signed)
Pharmacy Antibiotic Note  [name]. is a 66 y.o. male admitted on 02/11/2022 with sepsis. Pharmacy has been consulted for Vancomycin dosing.  Assessment: 66 yo M with PMH asthma, HFpEF (EF 55-60%), COPD, CAD, left BKA, depression, T2DM, gout, HTN, HLD, OSA presenting with acute onset of falls (6 times over 48 hrs) and generalized weakness. Pt found to be in afib with RVR, HR of 144 with RR 24, and lactic acid 2.0. Pt has skin wounds on left BKA stump and right foot/ankle as a result of falling. Pt is afebrile with WBCs within normal limits, but remains tachycardic and tachypneic, however lactic acid is trending down. Imaging negative for any acute findings. Bcx and Ucx have been collected. Renal function improved from yesterday. Pt was loaded with vancomycin 2500 mg, and received one-time doses of cefepime and metronidazole in the ED, but is now on vancomycin and ceftriaxone.  Vancomycin 1250 mg IV q12H Goal AUC 400-550  Est AUC: 478.6; Cmax: 31.8; Cmin: 13.1 SCr 0.91; IBW; Vd 0.5  Plan: Adjust vancomycin to 1250 mg IV q12H Monitor renal function to assess for necessary dosing changes Follow up culture results to assess for opportunities to narrow therapy  Height: '6\' 4"'$  (193 cm) Weight: 123.4 kg (272 lb) IBW/kg (Calculated) : 86.8  Temp (24hrs), Avg:98.1 F (36.7 C), Min:97.7 F (36.5 C), Max:98.5 F (36.9 C)  Recent Labs  Lab 02/11/22 1934 02/11/22 2240 02/12/22 0618  WBC 8.1  --  7.1  CREATININE 1.19  --  0.91  LATICACIDVEN 2.0* 1.6  --      Estimated Creatinine Clearance: 113 mL/min (by C-G formula based on SCr of 0.91 mg/dL).    Allergies  Allergen Reactions   Penicillins Other (See Comments)    Happened at 66 years old and pt. stated he passed out  He has tolerated amoxicillin/clavulanate and ampicillin/sulbactam   Statins     Other reaction(s): Muscle Pain Causes legs to ache per pt    Antimicrobials this admission: Cefepime 10/15 x 1  Vancomycin  10/15 >>  Metronidazole 10/15 x 1 Ceftriaxone 10/16 >>  Dose adjustments this admission: Vancomycin 1000 mg >> 1250 mg IV q12H  Microbiology results: 10/15 BCx: NGTD @ <12 hrs 10/15 Ucx: collected 10/15 RVP: negative  Thank you for allowing pharmacy to be a part of this patient's care.  Dara Hoyer, PharmD PGY-1 Pharmacy Resident 02/12/2022 10:53 AM

## 2022-02-12 NOTE — ED Notes (Signed)
Moved pt to hospital bed and changed pt into clean hospital gown.

## 2022-02-12 NOTE — Progress Notes (Signed)
  PROGRESS NOTE    [name].  TFT:[id number] DOB: 1955-12-22 DOA: 02/11/2022 PCP: [name], MD  245A/245A-AA  LOS: 1 day   Brief hospital course:   Assessment & Plan: Alec Less. is a 66 y.o. Caucasian male with medical history significant for asthma, CHF, COPD, coronary artery disease, left BKA, depression, type 2 diabetes mellitus, gout, hypertension, dyslipidemia, OSA, who presented to the ER with acute onset of falls and generalized weakness.  Per EMS the fire department has had to come to the patient's residence about 6 times over the last 48 hours due to recurrent falls.  He has been slipping off of his commode as well as his wheel chair on multiple occasions.  No loss of consciousness or head injury or associated with those falls.  He has subsequent skin abrasions and wounds to his left BKA stump as well as right foot and ankle as result of his falls.  He was noted to be tachycardic with irregularly irregular rhythm before coming to the ER.  He was in atrial fibrillation with rapid ventricular sponsor.    * Atrial fibrillation with rapid ventricular response (HCC) --started on dilt gtt and amiodarone gtt, and oral of both on admission --cardio consulted on admission Plan: --cont both dilt gtt and amiodarone gtt, per cardio --cont oral amiodarone and oral dilt --cont Eliquis  Lower extremity cellulitis --received vanc/cefe/flagyl on presentation --cont vanc and ceftriaxone for now  Uncontrolled type 2 diabetes mellitus with hyperglycemia, with long-term current use of insulin (HCC) --SSI for now  Essential hypertension --cont dilt  Benign prostatic hyperplasia with lower urinary tract symptoms - continue Proscar and Flomax.  Hypothyroidism - continue Synthroid   Dyslipidemia -  continue Zetia.  Generalized weakness --PT/OT  Sepsis ruled out --didn't meet criteria.  Tachycardia is 2/2 afib w RVR.   DVT prophylaxis: HC:[id number] Code  Status: DNR  Family Communication:  Level of care: Progressive Dispo:   The patient is from: home Anticipated d/c is to: home Anticipated d/c date is: 2-3 days   Subjective and Interval History:  Pt denied chest pain, dyspnea.     Objective: Vitals:   02/12/22 1525 02/12/22 1530 02/12/22 1630 02/12/22 1830  BP:  115/78 114/75 135/69  Pulse:  94 (!) 126 79  Resp:  (!) 21 20 (!) 21  Temp: (!) 97.4 F (36.3 C)     TempSrc: Oral     SpO2:  94% (!) 89% 91%  Weight:      Height:        Intake/Output Summary (Last 24 hours) at 02/12/2022 1927 Last data filed at 02/12/2022 1611 Gross per 24 hour  Intake 2245.98 ml  Output --  Net 2245.98 ml   Filed Weights   02/11/22 1929  Weight: 123.4 kg    Examination:   Constitutional: NAD, AAOx3 HEENT: conjunctivae and lids normal, EOMI CV: No cyanosis.   RESP: normal respiratory effort, on 2L Extremities: left BKA with stump erythema and warmth, RLE with erythema, edema, warmth and scabs SKIN: warm, dry Neuro: II - XII grossly intact.   Psych: Normal mood and affect.  Appropriate judgement and reason   Data Reviewed: I have personally reviewed labs and imaging studies  Time spent: 50 minutes  Enzo Bi, MD [facility] If 7PM-7AM, please contact night-coverage 02/12/2022, 7:27 PM

## 2022-02-12 NOTE — Progress Notes (Signed)
Pharmacy Antibiotic Note  [name]. is a 66 y.o. male admitted on 02/11/2022 with sepsis.  Pharmacy has been consulted for Vancomycin dosing.  Plan: Vancomycin 1 gm IV X 1 given in ED on 10/15 @ 2156.  Additional Vanc 1500 mg IV X ordered to make total loading dose of 2500 mg.  Vancomycin 1 gm IV Q12H ordered to start on 10/16 @ 1000.  AUC = 492.8 Vanc trough = 14.4   Height: '6\' 4"'$  (193 cm) Weight: 123.4 kg (272 lb) IBW/kg (Calculated) : 86.8  Temp (24hrs), Avg:98 F (36.7 C), Min:97.7 F (36.5 C), Max:98.5 F (36.9 C)  Recent Labs  Lab 02/11/22 1934 02/11/22 2240  WBC 8.1  --   CREATININE 1.19  --   LATICACIDVEN 2.0* 1.6    Estimated Creatinine Clearance: 86.4 mL/min (by C-G formula based on SCr of 1.19 mg/dL).    Allergies  Allergen Reactions   Penicillins Other (See Comments)    Happened at 66 years old and pt. stated he passed out  He has tolerated amoxicillin/clavulanate and ampicillin/sulbactam   Statins     Other reaction(s): Muscle Pain Causes legs to ache per pt    Antimicrobials this admission:   >>    >>   Dose adjustments this admission:   Microbiology results:  BCx:   UCx:    Sputum:    MRSA PCR:   Thank you for allowing pharmacy to be a part of this patient's care.  Swati Granberry D 02/12/2022 1:28 AM

## 2022-02-12 NOTE — ED Notes (Signed)
Alec Ruddy, MD aware that patient continues to be in Afib/rvr. No new orders at this time.

## 2022-02-13 ENCOUNTER — Encounter
Admission: EM | Disposition: A | Discharge status: Home Health | Payer: Self-pay | Source: Home / Self Care | Attending: Hospitalist

## 2022-02-13 ENCOUNTER — Inpatient Hospital Stay: Payer: Medicare Other | Admitting: Anesthesiology

## 2022-02-13 ENCOUNTER — Inpatient Hospital Stay: Payer: Medicare Other

## 2022-02-13 ENCOUNTER — Encounter: Payer: Self-pay | Admitting: Internal Medicine

## 2022-02-13 DIAGNOSIS — J9601 Acute respiratory failure with hypoxia: Secondary | ICD-10-CM

## 2022-02-13 DIAGNOSIS — I4891 Unspecified atrial fibrillation: Secondary | ICD-10-CM | POA: Diagnosis not present

## 2022-02-13 HISTORY — PX: CARDIOVERSION: SHX1299

## 2022-02-13 LAB — BASIC METABOLIC PANEL
Anion gap: 8 (ref 5–15)
BUN: 38 mg/dL — ABNORMAL HIGH (ref 8–23)
CO2: 23 mmol/L (ref 22–32)
Calcium: 8.7 mg/dL — ABNORMAL LOW (ref 8.9–10.3)
Chloride: 108 mmol/L (ref 98–111)
Creatinine, Ser: 1.02 mg/dL (ref 0.61–1.24)
GFR, Estimated: >60 mL/min (ref >60)
Glucose, Bld: 194 mg/dL — ABNORMAL HIGH (ref 70–99)
Potassium: 4.2 mmol/L (ref 3.5–5.1)
Sodium: 139 mmol/L (ref 135–145)

## 2022-02-13 LAB — CBC
HCT: 34.7 % — ABNORMAL LOW (ref 39.0–52.0)
Hemoglobin: 10.8 g/dL — ABNORMAL LOW (ref 13.0–17.0)
MCH: 28.3 pg (ref 26.0–34.0)
MCHC: 31.1 g/dL (ref 30.0–36.0)
MCV: 91.1 fL (ref 80.0–100.0)
Platelets: 180 10*3/uL (ref 150–400)
RBC: 3.81 MIL/uL — ABNORMAL LOW (ref 4.22–5.81)
RDW: 17.4 % — ABNORMAL HIGH (ref 11.5–15.5)
WBC: 6.8 10*3/uL (ref 4.0–10.5)
nRBC: 0 % (ref 0.0–0.2)

## 2022-02-13 LAB — GLUCOSE, CAPILLARY
Glucose-Capillary: 186 mg/dL — ABNORMAL HIGH (ref 70–99)
Glucose-Capillary: 169 mg/dL — ABNORMAL HIGH (ref 70–99)
Glucose-Capillary: 233 mg/dL — ABNORMAL HIGH (ref 70–99)
Glucose-Capillary: 240 mg/dL — ABNORMAL HIGH (ref 70–99)

## 2022-02-13 LAB — PROTIME-INR
INR: 1.4 — ABNORMAL HIGH (ref 0.8–1.2)
Prothrombin Time: 17.3 seconds — ABNORMAL HIGH (ref 11.4–15.2)

## 2022-02-13 LAB — MAGNESIUM: Magnesium: 1.9 mg/dL (ref 1.7–2.4)

## 2022-02-13 SURGERY — CARDIOVERSION
Anesthesia: General

## 2022-02-13 MED ORDER — PROPOFOL 10 MG/ML IV BOLUS
INTRAVENOUS | Status: AC
  Filled 2022-02-13: qty 40

## 2022-02-13 MED ORDER — HALOPERIDOL LACTATE 5 MG/ML IJ SOLN
5.0000 mg | Freq: Once | INTRAMUSCULAR | Status: AC
  Administered 2022-02-14: 5 mg via INTRAVENOUS
  Filled 2022-02-13: qty 1

## 2022-02-13 MED ORDER — FUROSEMIDE 10 MG/ML IJ SOLN
40.0000 mg | Freq: Once | INTRAMUSCULAR | Status: AC
  Administered 2022-02-13: 40 mg via INTRAVENOUS
  Filled 2022-02-13: qty 4

## 2022-02-13 MED ORDER — QUETIAPINE FUMARATE 25 MG PO TABS: 25.0000 mg | ORAL_TABLET | Freq: Once | ORAL | Status: DC

## 2022-02-13 MED ORDER — VANCOMYCIN HCL 1250 MG/250ML IV SOLN
1250.0000 mg | Freq: Two times a day (BID) | INTRAVENOUS | Status: DC
  Administered 2022-02-13 – 2022-02-14 (×2): 1250 mg via INTRAVENOUS
  Filled 2022-02-13 (×2): qty 250

## 2022-02-13 MED ORDER — PROPOFOL 10 MG/ML IV BOLUS
INTRAVENOUS | Status: DC | PRN
  Administered 2022-02-13: 40 mg via INTRAVENOUS

## 2022-02-13 MED ORDER — INSULIN GLARGINE-YFGN 100 UNIT/ML ~~LOC~~ SOLN
12.0000 [IU] | Freq: Every day | SUBCUTANEOUS | Status: DC
  Administered 2022-02-13 – 2022-02-14 (×2): 12 [IU] via SUBCUTANEOUS
  Filled 2022-02-13 (×3): qty 0.12

## 2022-02-13 MED ORDER — SODIUM CHLORIDE 0.9 % IV SOLN: INTRAVENOUS | Status: DC

## 2022-02-13 NOTE — Transfer of Care (Signed)
Immediate Anesthesia Transfer of Care Note  Patient: [name].  Procedure(s) Performed: CARDIOVERSION  Patient Location: Cath Lab  Anesthesia Type:General  Level of Consciousness: drowsy  Airway & Oxygen Therapy: Patient Spontanous Breathing and Patient connected to nasal cannula oxygen  Post-op Assessment: Report given to RN  Post vital signs: stable  Last Vitals:  Vitals Value Taken Time  BP 105/80 02/13/22 1235  Temp    Pulse 98 02/13/22 1237  Resp 22 02/13/22 1237  SpO2 97 % 02/13/22 1237    Last Pain:  Vitals:   02/13/22 1156  TempSrc: Oral  PainSc: 0-No pain      Patients Stated Pain Goal: 0 (24/58/09 9833)  Complications: No notable events documented.

## 2022-02-13 NOTE — CV Procedure (Signed)
Electrical Cardioversion Procedure Note Tyler Cubit [id number] 1955-07-06  Procedure: Electrical Cardioversion Indications:  Paroxysmal non valvular atrial fibrillation  Procedure Details Consent: Risks of procedure as well as the alternatives and risks of each were explained to the (patient/caregiver).  Consent for procedure obtained. Time Out: Verified patient identification, verified procedure, site/side was marked, verified correct patient position, special equipment/implants available, medications/allergies/relevent history reviewed, required imaging and test results available.  Performed  Patient placed on cardiac monitor, pulse oximetry, supplemental oxygen as necessary.  Sedation given: Propofol and versed as per anesthesia  Pacer pads placed anterior and posterior chest.  Cardioverted 2 time(s).  Cardioverted at 150J.  Evaluation Findings: Post procedure EKG shows: NSR Complications: None Patient did tolerate procedure well.   Serafina Royals M.D. [facility] 02/13/2022, 12:35 PM

## 2022-02-13 NOTE — Progress Notes (Signed)
PROGRESS NOTE    [name].  XBD:[id number] DOB: [birth year] DOA: 02/11/2022 PCP: [name], MD  245A/245A-AA  LOS: 2 days   Brief hospital course:   Assessment & Plan: Alec Less. is a 66 y.o. Caucasian male with medical history significant for asthma, CHF, COPD, coronary artery disease, left BKA, depression, type 2 diabetes mellitus, gout, hypertension, dyslipidemia, OSA, who presented to the ER with acute onset of falls and generalized weakness.  Per EMS the fire department has had to come to the patient's residence about 6 times over the last 48 hours due to recurrent falls.  He has been slipping off of his commode as well as his wheel chair on multiple occasions.  No loss of consciousness or head injury or associated with those falls.  He has subsequent skin abrasions and wounds to his left BKA stump as well as right foot and ankle as result of his falls.  He was noted to be tachycardic with irregularly irregular rhythm before coming to the ER.  He was in atrial fibrillation with rapid ventricular sponsor.    * Atrial fibrillation with rapid ventricular response (HCC) --started on dilt gtt and amiodarone gtt, and oral of both on admission --cardio consulted --maxed out on both dilt and amio gtt this morning and still in Afib w HR 130's.  BP also soft. Plan: --cardioversion today, with conversion to NSR, however, still tachycardic --cont IV and oral amio  --cont dilt gtt --d/c oral dilt due to hypotension --cont Eliquis   Acute hypoxemic respiratory failure (HCC) 2/2 pulm edema --needed up to 6L O2 today.   --recent Echo in June 2023 showed normal LVEF and diastolic fxn.  Pt likely has heart failure 2/2 Afib w RVR. Plan: --IV lasix 40 mg x1 today --cont diuresis based on response --Continue supplemental O2 to keep sats >=90%, wean as tolerated    Lower extremity cellulitis --received vanc/cefe/flagyl on presentation --cont vanc and ceftriaxone  for now  Uncontrolled type 2 diabetes mellitus with hyperglycemia, with long-term current use of insulin (HCC) --SSI --start glargine 12u nightly  Essential hypertension Not currently active.  BP soft.  Benign prostatic hyperplasia with lower urinary tract symptoms - continue Proscar and Flomax.  Hypothyroidism - continue Synthroid   Dyslipidemia -  continue Zetia.  Generalized weakness --PT/OT  Sepsis ruled out --didn't meet criteria.  Tachycardia is 2/2 afib w RVR.  UTI, ruled out --wife believed pt has UTI because pt had AMS which wife associates with UTI. --urine cx only 20,000 colonies, and pt denied dysuria.   DVT prophylaxis: ST:[id number] Code Status: DNR  Family Communication: wife updated at bedside today Level of care: Progressive Dispo:   The patient is from: home Anticipated d/c is to: home Anticipated d/c date is: 2-3 days.  6L O2 on IV lasix.   Subjective and Interval History:  Pt had persistent afib w RVR with rate 130's.  Cardio performed cardioversion with subsequent conversation to sinus, but remained tachy.  Pt developed hypoxia and worsening dyspnea today, CXR showed pulm edema, pt started on IV lasix.   Objective: Vitals:   02/13/22 1600 02/13/22 1700 02/13/22 1800 02/13/22 2000  BP: 126/79 117/68 (!) 116/56   Pulse: (!) 104 (!) 102 (!) 105 (!) 116  Resp: (!) '21 20 19 '$ (!) 21  Temp:  98.4 F (36.9 C)    TempSrc:  Oral    SpO2: 95% 94% 95% 95%  Weight:      Height:  Intake/Output Summary (Last 24 hours) at 02/13/2022 2137 Last data filed at 02/13/2022 1758 Gross per 24 hour  Intake 1115.5 ml  Output 1600 ml  Net -484.5 ml   Filed Weights   02/11/22 1929  Weight: 123.4 kg    Examination:   Constitutional: NAD, AAOx3 HEENT: conjunctivae and lids normal, EOMI CV: No cyanosis.   RESP: normal respiratory effort, on 6L Extremities: left BKA with erythema over stump, RLE with erythema and scabs Neuro: II - XII grossly intact.    Psych: Normal mood and affect.     Data Reviewed: I have personally reviewed labs and imaging studies  Time spent: 50 minutes  Enzo Bi, MD [facility] If 7PM-7AM, please contact night-coverage 02/13/2022, 9:37 PM

## 2022-02-13 NOTE — Inpatient Diabetes Management (Signed)
Inpatient Diabetes Program Recommendations  AACE/ADA: New Consensus Statement on Inpatient Glycemic Control  Target Ranges:  Prepandial:   less than 140 mg/dL      Peak postprandial:   less than 180 mg/dL (1-2 hours)      Critically ill patients:  140 - 180 mg/dL    Latest Reference Range & Units 02/12/22 16:39 02/12/22 22:15 02/13/22 07:56  Glucose-Capillary 70 - 99 mg/dL 231 (H) 200 (H) 186 (H)   Review of Glycemic Control  Diabetes history: DM2 Outpatient Diabetes medications: Semglee 0-25 units QHS, Novolog 0-20 units TID with meals, Metformin 1000 mg BID Current orders for Inpatient glycemic control: Novolog 0-15 units TID with meals, Novolog 0-5 units QHS  Inpatient Diabetes Program Recommendations:    Insulin: Please consider ordering Semglee 12 units Q24H.  Thanks, Barnie Alderman, RN, MSN, [place] Diabetes Coordinator Inpatient Diabetes Program [phone] (Team Pager from 8am to [place])

## 2022-02-13 NOTE — Assessment & Plan Note (Signed)
2/2 pulm edema --needed up to 6L O2 today.   --recent Echo in June 2023 showed normal LVEF and diastolic fxn.  Pt likely has heart failure 2/2 Afib w RVR. Plan: --IV lasix 40 mg x1 today --cont diuresis based on response --Continue supplemental O2 to keep sats >=90%, wean as tolerated

## 2022-02-13 NOTE — Consult Note (Signed)
[facility] Consultation Note  Patient ID: Alec Jurgens., MRN: [id number], DOB/AGE: 29-Sep-1955 66 y.o. Admit date: 02/11/2022   Date of Consult: 02/13/2022 Primary Physician: [name], MD   Chief Complaint: No chief complaint on file.  Reason for Consult:Atrial fibrillation  HPI: 66 y.o. male with asthma congestive heart failure COPD coronary artery disease left BKA depression diabetes gout hypertension hyperlipidemia sleep apnea presenting to the emergency room with generalized weakness and recurrent falls.  The patient has been slipping off the commode having other difficulties due to his left BKA.  The patient was seen in the emergency room at which time he had atrial fibrillation with rapid ventricular rate.  In the past it does appear that he has had paroxysmal nonvalvular atrial fibrillation and has been constantly on Eliquis 5 mg twice per day.  He claims that he has been taking these medications consistently for quite some time.  In addition to that he is been on medication management for heart rate control although still have difficulty with heart rate control at this time despite additional medication management.  Amiodarone drip was started on the emergency room for which the patient has not had a improve heart rate control but is now fully loaded for the possibility of electrical cardioversion.  We have discussed at length the risks and benefits of further treatment options including electrical cardioversion.  He understands the risk and benefits including the possibility of death stroke heart attack other rhythm disturbances and side effects of medication management.  He is at low risk for general anesthesia.  Past Medical History:  Diagnosis Date   Arrhythmia    atrial fibrillation   Asthma    CHF (congestive heart failure) (HCC)    COPD (chronic obstructive pulmonary disease) (HCC)    Coronary artery disease    Depression    Diabetes mellitus  without complication (HCC)    Gout    History anabolic steroid use    Hyperlipidemia    Hypertension    Hypogonadism in male    Morbid obesity ([place])    Myocardial infarction ([place])    Peripheral vascular disease ([place])    Perirectal abscess    Pleurisy    Sleep apnea    CPAP at night, no oxygen   Varicella       Surgical History:  Past Surgical History:  Procedure Laterality Date   ABDOMINAL AORTIC ANEURYSM REPAIR     ACHILLES TENDON SURGERY Left 01/10/2021   Procedure: ACHILLES LENGTHENING/KIDNER;  Surgeon: Caroline More, DPM;  Location: [facility]S;  Service: Podiatry;  Laterality: Left;   AMPUTATION Left 10/03/2021   Procedure: AMPUTATION BELOW KNEE;  Surgeon: Katha Cabal, MD;  Location: [facility];  Service: Vascular;  Laterality: Left;   AMPUTATION TOE Right 02/10/2016   Procedure: AMPUTATION TOE 3RD TOE;  Surgeon: Samara Deist, DPM;  Location: [facility];  Service: Podiatry;  Laterality: Right;   AMPUTATION TOE Left 02/24/2020   Procedure: AMPUTATION TOE;  Surgeon: Caroline More, DPM;  Location: [facility];  Service: Podiatry;  Laterality: Left;   APPLICATION OF WOUND VAC Left 02/29/2020   Procedure: APPLICATION OF WOUND VAC;  Surgeon: Caroline More, DPM;  Location: [facility];  Service: Podiatry;  Laterality: Left;   COLONOSCOPY WITH PROPOFOL [name] 11/18/2015   Procedure: COLONOSCOPY WITH PROPOFOL;  Surgeon: Manya Silvas, MD;  Location: [facility] ENDOSCOPY;  Service: Endoscopy;  Laterality: [name];   CORONARY ARTERY BYPASS GRAFT     CORONARY STENT INTERVENTION [name]  02/02/2020   Procedure: CORONARY STENT INTERVENTION;  Surgeon: Isaias Cowman, MD;  Location: [place] [facility];  Service: Cardiovascular;  Laterality: [name];   INCISION AND DRAINAGE Left 08/07/2021   Procedure: INCISION AND DRAINAGE-Partial Calcanectomy;  Surgeon: Caroline More, DPM;  Location: [facility];  Service: Podiatry;  Laterality: Left;   IRRIGATION AND DEBRIDEMENT FOOT Left 02/29/2020   Procedure: IRRIGATION AND  DEBRIDEMENT FOOT;  Surgeon: Caroline More, DPM;  Location: [facility];  Service: Podiatry;  Laterality: Left;   IRRIGATION AND DEBRIDEMENT FOOT Left 02/24/2020   Procedure: IRRIGATION AND DEBRIDEMENT FOOT;  Surgeon: Caroline More, DPM;  Location: [facility];  Service: Podiatry;  Laterality: Left;   KNEE ARTHROSCOPY     LEFT HEART CATH AND CORS/GRAFTS ANGIOGRAPHY [name] 02/02/2020   Procedure: LEFT HEART CATH AND CORS/GRAFTS ANGIOGRAPHY;  Surgeon: Teodoro Spray, MD;  Location: [place] [facility];  Service: Cardiovascular;  Laterality: [name];   LOWER EXTREMITY ANGIOGRAPHY Left 02/25/2020   Procedure: Lower Extremity Angiography;  Surgeon: Algernon Huxley, MD;  Location: [place] [facility];  Service: Cardiovascular;  Laterality: Left;   LOWER EXTREMITY ANGIOGRAPHY Left 01/04/2021   Procedure: LOWER EXTREMITY ANGIOGRAPHY;  Surgeon: Algernon Huxley, MD;  Location: [place] [facility];  Service: Cardiovascular;  Laterality: Left;   METATARSAL HEAD EXCISION Left 01/10/2021   Procedure: METATARSAL HEAD EXCISION - LEFT 5th;  Surgeon: Caroline More, DPM;  Location: [facility];  Service: Podiatry;  Laterality: Left;   PERIPHERAL VASCULAR CATHETERIZATION Right 01/24/2016   Procedure: Lower Extremity Angiography;  Surgeon: Katha Cabal, MD;  Location: [place] [facility];  Service: Cardiovascular;  Laterality: Right;   PERIPHERAL VASCULAR CATHETERIZATION Right 01/25/2016   Procedure: Lower Extremity Angiography;  Surgeon: Katha Cabal, MD;  Location: [place] [facility];  Service: Cardiovascular;  Laterality: Right;   TOE AMPUTATION     TONSILLECTOMY       Home Meds: Prior to Admission medications   Medication Sig Start Date End Date Taking? Authorizing Provider  amiodarone (PACERONE) 200 MG tablet Take 200 mg by mouth daily.   Yes [provider]  apixaban (ELIQUIS) 5 MG TABS tablet Take 1 tablet (5 mg total) by mouth 2 (two) times daily. 01/26/16  Yes Stegmayer, Janalyn Harder, PA-C  aspirin EC 81 MG  EC tablet Take 1 tablet (81 mg total) by mouth daily. Swallow whole. 05/17/21  Yes Dessa Phi, DO  budesonide (PULMICORT) 0.5 MG/2ML nebulizer solution Take 2 mLs (0.5 mg total) by nebulization 2 (two) times daily. 11/10/21  Yes Emeterio Reeve, DO  Cholecalciferol 25 MCG (1000 UT) tablet Take 1,000 Units by mouth daily.   Yes [provider]  Cyanocobalamin (VITAMIN B-12) 5000 MCG TBDP Take 10,000 mcg by mouth daily.   Yes [provider]  diltiazem (CARDIZEM SR) 120 MG 12 hr capsule Take 1 capsule (120 mg total) by mouth every 12 (twelve) hours. 11/10/21  Yes Emeterio Reeve, DO  ezetimibe (ZETIA) 10 MG tablet Take 10 mg by mouth at bedtime.   Yes [provider]  ferrous sulfate 325 (65 FE) MG tablet Take 325 mg by mouth daily with breakfast.   Yes [provider]  finasteride (PROSCAR) 5 MG tablet Take 1 tablet (5 mg total) by mouth daily. 11/11/21  Yes Emeterio Reeve, DO  insulin aspart (NOVOLOG) 100 UNIT/ML injection Inject 0-20 Units into the skin 3 (three) times daily with meals. 11/10/21  Yes Emeterio Reeve, DO  insulin glargine-yfgn (SEMGLEE) 100 UNIT/ML injection Inject 0.25 mLs (  25 Units total) into the skin at bedtime. Patient taking differently: Inject 0-25 Units into the skin at bedtime. 11/10/21  Yes Emeterio Reeve, DO  levothyroxine (SYNTHROID) 25 MCG tablet Take 1 tablet (25 mcg total) by mouth daily at 6 (six) AM. 11/11/21  Yes Emeterio Reeve, DO  metFORMIN (GLUCOPHAGE) 1000 MG tablet Take 1,000 mg by mouth 2 (two) times daily.  12/02/17  Yes [provider]  Multiple Vitamins-Minerals (MULTIVITAMIN WITH MINERALS) tablet Take 1 tablet by mouth daily.   Yes [provider]  nystatin (MYCOSTATIN/NYSTOP) powder Apply topically 2 (two) times daily. 11/10/21  Yes Emeterio Reeve, DO  primidone (MYSOLINE) 250 MG tablet Take 250 mg by mouth 2 (two) times daily. 01/23/20  Yes [provider]  risperiDONE  (RISPERDAL) 0.5 MG tablet Take 1 tablet (0.5 mg total) by mouth 2 (two) times daily. 11/10/21  Yes Emeterio Reeve, DO  tamsulosin (FLOMAX) 0.4 MG CAPS capsule Take 0.4 mg by mouth daily.   Yes [provider]  vitamin C (ASCORBIC ACID) 500 MG tablet Take 500 mg by mouth daily.   Yes [provider]  acetaminophen (TYLENOL) 325 MG tablet Take 2 tablets (650 mg total) by mouth every 6 (six) hours as needed for mild pain (or Fever >/= 101). 11/10/21   Emeterio Reeve, DO  nitroGLYCERIN (NITROSTAT) 0.4 MG SL tablet Place 0.4 mg under the tongue every 5 (five) minutes as needed for chest pain. Patient not taking: Reported on 02/11/2022    [provider]  traMADol (ULTRAM) 50 MG tablet Take 1 tablet (50 mg total) by mouth daily as needed for moderate pain. 08/11/21   Fritzi Mandes, MD    Inpatient Medications:   amiodarone  200 mg Oral Daily   apixaban  5 mg Oral BID   ascorbic acid  500 mg Oral Daily   aspirin EC  81 mg Oral Daily   budesonide  0.5 mg Nebulization BID   cholecalciferol  1,000 Units Oral Daily   ezetimibe  10 mg Oral Daily   ferrous sulfate  325 mg Oral Q breakfast   finasteride  5 mg Oral Daily   insulin aspart  0-15 Units Subcutaneous TID WC   insulin aspart  0-5 Units Subcutaneous QHS   levothyroxine  25 mcg Oral Q0600   multivitamin with minerals  1 tablet Oral Daily   nystatin   Topical BID   primidone  250 mg Oral BID   risperiDONE  0.5 mg Oral BID   tamsulosin  0.4 mg Oral Daily    amiodarone 30 mg/hr (02/13/22 0509)   cefTRIAXone (ROCEPHIN)  IV 2 g (02/13/22 0504)   diltiazem (CARDIZEM) infusion 15 mg/hr (02/13/22 0508)   vancomycin 1,250 mg (02/12/22 2217)    Allergies:  Allergies  Allergen Reactions   Penicillins Other (See Comments)    Happened at 66 years old and pt. stated he passed out  He has tolerated amoxicillin/clavulanate and ampicillin/sulbactam   Statins     Other reaction(s): Muscle Pain Causes legs to ache per pt     Social History   Socioeconomic History   Marital status: Married    Spouse name: Not on file   Number of children: Not on file   Years of education: Not on file   Highest education level: Not on file  Occupational History   Not on file  Tobacco Use   Smoking status: Former    Packs/day: 0.50    Years: 45.00    Total pack years:  22.50    Types: Cigarettes    Quit date: 04/05/2015    Years since quitting: 6.8   Smokeless tobacco: Never  Vaping Use   Vaping Use: Every day  Substance and Sexual Activity   Alcohol use: Not Currently    Alcohol/week: 3.0 standard drinks of alcohol    Types: 3 Glasses of wine per week   Drug use: No   Sexual activity: Not on file  Other Topics Concern   Not on file  Social History Narrative   Not on file   Social Determinants of Health   Financial Resource Strain: Not on file  Food Insecurity: No Food Insecurity (02/12/2022)   Hunger Vital Sign    Worried About Running Out of Food in the Last Year: Never true    Ran Out of Food in the Last Year: Never true  Transportation Needs: Unmet Transportation Needs (02/12/2022)   PRAPARE - Hydrologist (Medical): Yes    Lack of Transportation (Non-Medical): Yes  Physical Activity: Not on file  Stress: Not on file  Social Connections: Not on file  Intimate Partner Violence: Not At Risk (02/12/2022)   Humiliation, Afraid, Rape, and Kick questionnaire    Fear of Current or Ex-Partner: No    Emotionally Abused: No    Physically Abused: No    Sexually Abused: No     Family History  Problem Relation Age of Onset   Hypertension Father    Coronary artery disease Father    Alcohol abuse Father    Heart failure Brother      Review of Systems Positive for shortness of breath Negative for: General:  chills, fever, night sweats or weight changes.  Cardiovascular: PND orthopnea syncope dizziness  Dermatological skin lesions rashes Respiratory: Cough  congestion Urologic: Frequent urination urination at night and hematuria Abdominal: negative for nausea, vomiting, diarrhea, bright red blood per rectum, melena, or hematemesis Neurologic: negative for visual changes, and/or hearing changes  All other systems reviewed and are otherwise negative except as noted above.  Labs: No results for input(s): "CKTOTAL", "CKMB", "TROPONINI" in the last 72 hours. Lab Results  Component Value Date   WBC 6.8 02/13/2022   HGB 10.8 (L) 02/13/2022   HCT 34.7 (L) 02/13/2022   MCV 91.1 02/13/2022   PLT 180 02/13/2022    Recent Labs  Lab 02/11/22 1934 02/12/22 0618 02/13/22 0625  NA 142   < > 139  K 4.6   < > 4.2  CL 109   < > 108  CO2 25   < > 23  BUN 46*   < > 38*  CREATININE 1.19   < > 1.02  CALCIUM 9.3   < > 8.7*  PROT 7.9  --   --   BILITOT 0.5  --   --   ALKPHOS 142*  --   --   ALT 20  --   --   AST 21  --   --   GLUCOSE 247*   < > 194*   < > = values in this interval not displayed.   No results found for: "CHOL", "HDL", "LDLCALC", "TRIG" No results found for: "DDIMER"  Radiology/Studies:  CT Head Wo Contrast  Result Date: 02/11/2022 CLINICAL DATA:  Head and neck trauma. Multiple falls over the past few days. EXAM: CT HEAD WITHOUT CONTRAST CT CERVICAL SPINE WITHOUT CONTRAST TECHNIQUE: Multidetector CT imaging of the head and cervical spine was performed following the standard protocol without intravenous  contrast. Multiplanar CT image reconstructions of the cervical spine were also generated. RADIATION DOSE REDUCTION: This exam was performed according to the departmental dose-optimization program which includes automated exposure control, adjustment of the mA and/or kV according to patient size and/or use of iterative reconstruction technique. COMPARISON:  None Available. FINDINGS: CT HEAD FINDINGS Brain: No evidence of acute infarction, hemorrhage, hydrocephalus, extra-axial collection or mass lesion/mass effect. Scattered areas of low  attenuation suggesting chronic microvascular ischemic changes. Vascular: No hyperdense vessel or unexpected calcification. Skull: Normal. Negative for fracture or focal lesion. Sinuses/Orbits: No acute finding. Other: None. CT CERVICAL SPINE FINDINGS Alignment: Straightening of the cervical spine. Skull base and vertebrae: No acute fracture. No primary bone lesion or focal pathologic process. Soft tissues and spinal canal: No prevertebral fluid or swelling. No visible canal hematoma. Disc levels: C2-C3:  No significant findings. C3-C4: Disc height loss and uncovertebral joint arthropathy with moderate bilateral neural foraminal stenosis. C4-C5: Mild uncovertebral joint arthropathy with mild right neural foraminal stenosis. C5-C6:  No significant findings. C6-C7: Disc height loss and severe uncovertebral joint arthropathy with moderate left and severe right neural foraminal stenosis. C7-T1: Disc height loss and uncovertebral joint arthropathy with severe bilateral neural foraminal stenosis. Upper chest: Prominent atherosclerotic calcification of aorta and branch vessels. Other: None IMPRESSION: CT HEAD: 1. No acute intracranial abnormality. 2. Chronic microvascular ischemic changes of the white matter. CT CERVICAL SPINE: 1. No acute fracture or traumatic subluxation. 2. Moderate to severe multilevel degenerative disc disease and uncovertebral joint arthropathy with severe neural foraminal stenosis at C6-C7 and C7-T1. 3. Aortic atherosclerosis. Electronically Signed   By: Keane Police D.O.   On: 02/11/2022 20:58   CT Cervical Spine Wo Contrast  Result Date: 02/11/2022 CLINICAL DATA:  Head and neck trauma. Multiple falls over the past few days. EXAM: CT HEAD WITHOUT CONTRAST CT CERVICAL SPINE WITHOUT CONTRAST TECHNIQUE: Multidetector CT imaging of the head and cervical spine was performed following the standard protocol without intravenous contrast. Multiplanar CT image reconstructions of the cervical spine were  also generated. RADIATION DOSE REDUCTION: This exam was performed according to the departmental dose-optimization program which includes automated exposure control, adjustment of the mA and/or kV according to patient size and/or use of iterative reconstruction technique. COMPARISON:  None Available. FINDINGS: CT HEAD FINDINGS Brain: No evidence of acute infarction, hemorrhage, hydrocephalus, extra-axial collection or mass lesion/mass effect. Scattered areas of low attenuation suggesting chronic microvascular ischemic changes. Vascular: No hyperdense vessel or unexpected calcification. Skull: Normal. Negative for fracture or focal lesion. Sinuses/Orbits: No acute finding. Other: None. CT CERVICAL SPINE FINDINGS Alignment: Straightening of the cervical spine. Skull base and vertebrae: No acute fracture. No primary bone lesion or focal pathologic process. Soft tissues and spinal canal: No prevertebral fluid or swelling. No visible canal hematoma. Disc levels: C2-C3:  No significant findings. C3-C4: Disc height loss and uncovertebral joint arthropathy with moderate bilateral neural foraminal stenosis. C4-C5: Mild uncovertebral joint arthropathy with mild right neural foraminal stenosis. C5-C6:  No significant findings. C6-C7: Disc height loss and severe uncovertebral joint arthropathy with moderate left and severe right neural foraminal stenosis. C7-T1: Disc height loss and uncovertebral joint arthropathy with severe bilateral neural foraminal stenosis. Upper chest: Prominent atherosclerotic calcification of aorta and branch vessels. Other: None IMPRESSION: CT HEAD: 1. No acute intracranial abnormality. 2. Chronic microvascular ischemic changes of the white matter. CT CERVICAL SPINE: 1. No acute fracture or traumatic subluxation. 2. Moderate to severe multilevel degenerative disc disease and uncovertebral joint arthropathy with severe neural  foraminal stenosis at C6-C7 and C7-T1. 3. Aortic atherosclerosis.  Electronically Signed   By: Keane Police D.O.   On: 02/11/2022 20:58   DG Ankle Complete Right  Result Date: 02/11/2022 CLINICAL DATA:  Fall, swelling EXAM: RIGHT ANKLE - COMPLETE 3+ VIEW COMPARISON:  None Available. FINDINGS: Plantar and posterior calcaneal spurs. No acute bony abnormality. Specifically, no fracture, subluxation, or dislocation. Soft tissues are intact. IMPRESSION: No acute bony abnormality. Electronically Signed   By: Rolm Baptise M.D.   On: 02/11/2022 20:08   DG Foot 2 Views Right  Result Date: 02/11/2022 CLINICAL DATA:  Weakness, fall EXAM: RIGHT FOOT - 2 VIEW COMPARISON:  None Available. FINDINGS: Prior amputation of the 2nd and 3rd toes. No acute bony abnormality. Specifically, no fracture, subluxation, or dislocation. Soft tissues are intact. No soft tissue gas. Plantar and posterior calcaneal spur. IMPRESSION: No acute bony abnormality. Electronically Signed   By: Rolm Baptise M.D.   On: 02/11/2022 20:07   DG Chest Port 1 View  Result Date: 02/11/2022 CLINICAL DATA:  Questionable sepsis. EXAM: PORTABLE CHEST 1 VIEW COMPARISON:  10/25/2021 FINDINGS: Prior CABG. Cardiomegaly. No confluent opacities, effusions or edema. No acute bony abnormality. IMPRESSION: Cardiomegaly.  No active disease. Electronically Signed   By: Rolm Baptise M.D.   On: 02/11/2022 20:07    EKG: Atrial fibrillation rapid ventricular rate with nonspecific ST changes  Weights: Filed Weights   02/11/22 1929  Weight: 123.4 kg     Physical Exam: Blood pressure 114/74, pulse (!) 111, temperature 97.9 F (36.6 C), temperature source Oral, resp. rate 20, height '6\' 4"'$  (1.93 m), weight 123.4 kg, SpO2 94 %. Body mass index is 33.11 kg/m. General: Well developed, well nourished, in no acute distress. Head eyes ears nose throat: Normocephalic, atraumatic, sclera non-icteric, no xanthomas, nares are without discharge. No apparent thyromegaly and/or mass  Lungs: Normal respiratory effort.  no wheezes, no  rales, no rhonchi.  Heart: Irregular with normal S1 S2. no murmur gallop, no rub, PMI is normal size and placement, carotid upstroke normal without bruit, jugular venous pressure is normal Abdomen: Soft, non-tender, non-distended with normoactive bowel sounds. No hepatomegaly. No rebound/guarding. No obvious abdominal masses. Abdominal aorta is normal size without bruit Extremities: Trace right lower edema.  Left BKA and right multiple toe excisions no cyanosis, no clubbing, no ulcers  Peripheral : 2+ bilateral upper extremity pulses, 2+ bilateral femoral pulses, 2+ bilateral dorsal pedal pulse Neuro: Alert and oriented. No facial asymmetry. No focal deficit. Moves all extremities spontaneously. Musculoskeletal: Normal muscle tone without kyphosis Psych:  Responds to questions appropriately with a normal affect.    Assessment: 66 year old male with multiple medical problems including diabetes hypertension hyperlipidemia coronary artery disease sleep apnea and peripheral vascular disease status post multiple peripheral vascular surgeries and paroxysmal nonvalvular atrial fibrillation now with atrial fibrillation with rapid ventricular rate needing further medical management.  Plan: 1.  Continue anticoagulation of Eliquis 5 mg twice per day for which she is patient has been taking for quite some time without interruption 2.  Continue medication management including intravenous medication management for heart rate control and amiodarone load 3.  Proceed to electrical cardioversion to improve current condition and better heart rate control 4.  Continue supportive care for other medical management with medications listed as above  Signed, Corey Skains M.D. [facility] 02/13/2022, 8:46 AM

## 2022-02-13 NOTE — TOC CM/SW Note (Signed)
Went by room for readmission prevention screen but patient off unit. Will try again later.  Dayton Scrape, [place]

## 2022-02-13 NOTE — Anesthesia Postprocedure Evaluation (Signed)
Anesthesia Post Note  Patient: [name].  Procedure(s) Performed: CARDIOVERSION  Patient location during evaluation: PACU Anesthesia Type: General Level of consciousness: patient cooperative and awake (return to preop baseline) Pain management: pain level controlled Vital Signs Assessment: post-procedure vital signs reviewed and stable Respiratory status: spontaneous breathing, nonlabored ventilation and respiratory function stable Cardiovascular status: blood pressure returned to baseline and stable Postop Assessment: adequate PO intake Anesthetic complications: no   No notable events documented.   Last Vitals:  Vitals:   02/13/22 1237 02/13/22 1241  BP:  104/74  Pulse: 98 (!) 102  Resp: (!) 22 (!) 23  Temp:    SpO2: 97% 90%    Last Pain:  Vitals:   02/13/22 1241  TempSrc:   PainSc: 0-No pain                 Darrin Nipper

## 2022-02-13 NOTE — TOC Initial Note (Signed)
Transition of Care (TOC) - Initial/Assessment Note    Patient Details  Name: [name]. MRN: [id number] Date of Birth: 29-Sep-1955  Transition of Care [facility]) CM/SW Contact:    Candie Chroman, LCSW Phone Number: 02/13/2022, 5:00 PM  Clinical Narrative:     Readmission prevention screen complete. CSW met with patient. Wife at bedside. CSW introduced role and explained that discharge planning would be discussed. PCP is Fulton Reek, MD. Wife drives him to appointments. He uses [facility]. No issues obtaining medications. Patient is active with [facility] for PT. Per wife, OT was supposed to start tomorrow. Will ask them if they can add any other services. Patient has a wheelchair, BSC, shower seat, and sliding board at home. Patient was at [facility] 7/17-10/9 (84 days) and was at [facility] prior to that so wife said he has used all of his rehab days. Made [facility] referral so they can discuss potential personal care services. No further concerns. CSW encouraged patient and his wife to contact CSW as needed. CSW will continue to follow patient for support and facilitate return home once stable.             Expected Discharge Plan: [place] Barriers to Discharge: Continued Medical Work up   Patient Goals and CMS Choice     Choice offered to / list presented to : NA  Expected Discharge Plan and Services Expected Discharge Plan: [place] Acute Care Choice: Resumption of Svcs/PTA Provider Living arrangements for the past 2 months: Single Family Home                                      Prior Living Arrangements/Services Living arrangements for the past 2 months: Single Family Home Lives with:: Spouse Patient language and need for interpreter reviewed:: Yes Do you feel safe going back to the place where you live?: Yes      Need for Family Participation in Patient Care: Yes  (Comment) Care giver support system in place?: Yes (comment) Current home services: DME, Home PT Criminal Activity/Legal Involvement Pertinent to Current Situation/Hospitalization: No - Comment as needed  Activities of Daily Living   ADL Screening (condition at time of admission) Patient's cognitive ability adequate to safely complete daily activities?: Yes Is the patient deaf or have difficulty hearing?: No Does the patient have difficulty seeing, even when wearing glasses/contacts?: No Does the patient have difficulty concentrating, remembering, or making decisions?: No Patient able to express need for assistance with ADLs?: Yes Does the patient have difficulty dressing or bathing?: Yes Independently performs ADLs?: No Communication: Independent Dressing (OT): Needs assistance Is this a change from baseline?: Pre-admission baseline Grooming: Independent Feeding: Independent Bathing: Needs assistance Is this a change from baseline?: Pre-admission baseline In/Out Bed: Needs assistance Is this a change from baseline?: Pre-admission baseline Walks in Home: Needs assistance Is this a change from baseline?: Pre-admission baseline Does the patient have difficulty walking or climbing stairs?: Yes Weakness of Legs: Both Weakness of Arms/Hands: Both  Permission Sought/Granted Permission sought to share information with : Facility Sport and exercise psychologist, Family Supports Permission granted to share information with : Yes, Verbal Permission Granted  Share Information with NAME: Engineer, petroleum  Permission granted to share info w AGENCY: [place] granted to share info w  Relationship: Spouse  Permission granted to share info w Contact Information: [phone]  Emotional Assessment Appearance:: Appears stated age Attitude/Demeanor/Rapport: Engaged, Gracious Affect (typically observed): Accepting, Appropriate, Calm, Pleasant Orientation: : Oriented to Self,  Oriented to Place, Oriented to  Time, Oriented to Situation Alcohol / Substance Use: Not Applicable Psych Involvement: No (comment)  Admission diagnosis:  Cellulitis of right lower extremity [L03.115] Atrial fibrillation with RVR (HCC) [I48.91] Sepsis due to cellulitis (HCC) [L03.90, A41.9] Sepsis without acute organ dysfunction, due to unspecified organism [facility]) [A41.9] Patient Active Problem List   Diagnosis Date Noted   Atrial fibrillation with rapid ventricular response ([place]) 02/11/2022   Uncontrolled type 2 diabetes mellitus with hyperglycemia, with long-term current use of insulin ([place]) 02/11/2022   Dyslipidemia 02/11/2022   Hypothyroidism 02/11/2022   Benign prostatic hyperplasia with lower urinary tract symptoms 02/11/2022   Pressure injury of skin of heel 11/04/2021   UTI (urinary tract infection) 10/22/2021   Iron deficiency anemia 10/19/2021   Hyperkalemia 10/14/2021   Acute urinary retention 10/08/2021   Sepsis due to methicillin resistant Staphylococcus aureus (MRSA) ([place]) 10/04/2021   Acute delirium 10/04/2021   Acute on chronic respiratory failure with hypoxia and hypercapnia (HCC) 10/04/2021   Acute respiratory failure with hypoxia and hypercapnia (HCC) 10/03/2021   OSA (obstructive sleep apnea) 10/03/2021   Malnutrition of moderate degree 09/29/2021   Generalized weakness 09/29/2021   Cellulitis of left foot 08/04/2021   Uncontrolled type 2 diabetes mellitus with hypoglycemia, with long-term current use of insulin ([place]) 08/04/2021   Bilateral lower extremity edema 08/04/2021   Acute osteomyelitis of lower leg, left ([place]) 08/04/2021   Swelling of limb 07/18/2021   CHF exacerbation ([place]) 05/08/2021   Diabetes mellitus without complication (HCC)    Atrial fibrillation with RVR (HCC)    Obesity, Class III, BMI 40-49.9 (morbid obesity) ([place])    Venous stasis ulcer (HCC)    Sinus tachycardia    BPH (benign prostatic hyperplasia)    Chronic anticoagulation     Anasarca    Acute on chronic diastolic CHF (congestive heart failure) (HCC)    Venous stasis of both lower extremities    AF (paroxysmal atrial fibrillation) (HCC)    Lower extremity cellulitis 01/03/2021   Chronic osteomyelitis of left foot ([place]) 01/03/2021   Osteomyelitis ([place]) 01/03/2021   Severe sepsis ([place]) 02/24/2020   AKI (acute kidney injury) ([place]) 02/24/2020   Bilateral cellulitis of lower leg 02/23/2020   CAD (coronary artery disease) 02/02/2020   RLS (restless legs syndrome) 05/15/2019   Statin myopathy 05/15/2019   Diabetes mellitus type 2 in obese ([place]) 02/13/2016   Essential hypertension 02/13/2016   PAD (peripheral artery disease) ([place]) 02/13/2016   Pressure injury of skin 01/25/2016   Hypoglycemia associated with diabetes ([place]) 12/27/2013   Long-term insulin use ([place]) 12/27/2013   Microalbuminuria 12/27/2013   B12 deficiency 09/29/2013   Obesity 09/29/2013   CAD (coronary artery disease), autologous vein bypass graft 07/20/2013   Hypersomnia with sleep apnea 07/20/2013   Pure hypercholesterolemia 07/20/2013   Osteomyelitis of ankle or foot 07/20/2011   PCP:  [name], MD Pharmacy:   [place], [place] [address] [place] [address] Phone: [phone] Fax: [phone]  [facility] - [place], [place] [address] [place] [address] Phone: [phone] Fax: [phone]  [facility] - [facility] [place], [place] - [address] Kristeen Mans [phone]  [place] [address] Phone: [phone] Fax: [phone]     Social Determinants of Health (SDOH) Interventions    Readmission Risk Interventions    02/13/2022    4:57 PM 09/29/2021   11:12 AM  Readmission Risk Prevention Plan  Transportation Screening Complete Complete  PCP or Specialist Appt within 3-5 Days  Complete  HRI or [place]  Complete  Social Work Consult for [place]  Planning/Counseling  Complete  Palliative Care Screening  Not Applicable  Medication Review Press photographer) Complete Complete  PCP or Specialist appointment within 3-5 days of discharge Complete   HRI or [place] Complete   SW Recovery Care/Counseling Consult Complete   [place] Not Applicable

## 2022-02-13 NOTE — Anesthesia Preprocedure Evaluation (Addendum)
Anesthesia Evaluation  Patient identified by MRN, date of birth, ID band Patient confused    Reviewed: Allergy & Precautions, NPO status , Patient's Chart, lab work & pertinent test results  History of Anesthesia Complications Negative for: history of anesthetic complications  Airway Mallampati: IV   Neck ROM: Full    Dental  (+) Upper Dentures, Poor Dentition   Pulmonary asthma , sleep apnea , COPD, former smoker (quit 2016),    Pulmonary exam normal breath sounds clear to auscultation       Cardiovascular hypertension, + CAD (s/p MI, CABG, stents), + Peripheral Vascular Disease (s/p L BKA; AAA s/p repair) and +CHF  + dysrhythmias (a fib on Eliquis)  Rhythm:Irregular Rate:Tachycardia  ECG 02/13/22:  Atrial fibrillation with rapid ventricular response with premature ventricular or aberrantly conducted complexes Right bundle branch block Left anterior fascicular block * Bifascicular block * Cannot rule out Inferior infarct (masked by fascicular block?) , age undetermined   Neuro/Psych PSYCHIATRIC DISORDERS Depression negative neurological ROS     GI/Hepatic negative GI ROS,   Endo/Other  diabetes, Type 2Obesity   Renal/GU negative Renal ROS     Musculoskeletal Gout    Abdominal   Peds  Hematology negative hematology ROS (+)   Anesthesia Other Findings   Reproductive/Obstetrics                            Anesthesia Physical Anesthesia Plan  ASA: 4  Anesthesia Plan: General   Post-op Pain Management:    Induction: Intravenous  PONV Risk Score and Plan: 2 and Propofol infusion, TIVA and Treatment may vary due to age or medical condition  Airway Management Planned: Natural Airway  Additional Equipment:   Intra-op Plan:   Post-operative Plan:   Informed Consent: I have reviewed the patients History and Physical, chart, labs and discussed the procedure including the risks,  benefits and alternatives for the proposed anesthesia with the patient or authorized representative who has indicated his/her understanding and acceptance.   Patient has DNR.  Discussed DNR with patient, Discussed DNR with power of attorney and Suspend DNR.   Consent reviewed with POA  Plan Discussed with: CRNA  Anesthesia Plan Comments: (History and consent obtained from wife [name] via phone (pt has had hallucinations over past few days).  LMA/GETA backup discussed.  Patient's wife consented for risks of anesthesia including but not limited to:  - adverse reactions to medications - damage to eyes, teeth, lips or other oral mucosa - nerve damage due to positioning  - sore throat or hoarseness - damage to heart, brain, nerves, lungs, other parts of body or loss of life  Informed patient's wife about role of CRNA in peri- and intra-operative care, she voiced understanding.)       Anesthesia Quick Evaluation

## 2022-02-14 DIAGNOSIS — I5033 Acute on chronic diastolic (congestive) heart failure: Secondary | ICD-10-CM | POA: Diagnosis not present

## 2022-02-14 DIAGNOSIS — E119 Type 2 diabetes mellitus without complications: Secondary | ICD-10-CM | POA: Diagnosis not present

## 2022-02-14 DIAGNOSIS — Z471 Aftercare following joint replacement surgery: Secondary | ICD-10-CM | POA: Diagnosis not present

## 2022-02-14 DIAGNOSIS — I4891 Unspecified atrial fibrillation: Secondary | ICD-10-CM | POA: Diagnosis not present

## 2022-02-14 DIAGNOSIS — I110 Hypertensive heart disease with heart failure: Secondary | ICD-10-CM | POA: Diagnosis not present

## 2022-02-14 LAB — URINE CULTURE: Culture: 20000 — AB

## 2022-02-14 LAB — CBC
HCT: 35.2 % — ABNORMAL LOW (ref 39.0–52.0)
Hemoglobin: 10.9 g/dL — ABNORMAL LOW (ref 13.0–17.0)
MCH: 28.5 pg (ref 26.0–34.0)
MCHC: 31 g/dL (ref 30.0–36.0)
MCV: 92.1 fL (ref 80.0–100.0)
Platelets: 159 10*3/uL (ref 150–400)
RBC: 3.82 MIL/uL — ABNORMAL LOW (ref 4.22–5.81)
RDW: 17.2 % — ABNORMAL HIGH (ref 11.5–15.5)
WBC: 7.4 10*3/uL (ref 4.0–10.5)
nRBC: 0 % (ref 0.0–0.2)

## 2022-02-14 LAB — BASIC METABOLIC PANEL
Anion gap: 6 (ref 5–15)
BUN: 31 mg/dL — ABNORMAL HIGH (ref 8–23)
CO2: 26 mmol/L (ref 22–32)
Calcium: 8.4 mg/dL — ABNORMAL LOW (ref 8.9–10.3)
Chloride: 108 mmol/L (ref 98–111)
Creatinine, Ser: 0.99 mg/dL (ref 0.61–1.24)
GFR, Estimated: >60 mL/min (ref >60)
Glucose, Bld: 240 mg/dL — ABNORMAL HIGH (ref 70–99)
Potassium: 4.2 mmol/L (ref 3.5–5.1)
Sodium: 140 mmol/L (ref 135–145)

## 2022-02-14 LAB — GLUCOSE, CAPILLARY
Glucose-Capillary: 196 mg/dL — ABNORMAL HIGH (ref 70–99)
Glucose-Capillary: 254 mg/dL — ABNORMAL HIGH (ref 70–99)
Glucose-Capillary: 193 mg/dL — ABNORMAL HIGH (ref 70–99)
Glucose-Capillary: 207 mg/dL — ABNORMAL HIGH (ref 70–99)

## 2022-02-14 LAB — MAGNESIUM: Magnesium: 1.9 mg/dL (ref 1.7–2.4)

## 2022-02-14 MED ORDER — DILTIAZEM HCL ER 60 MG PO CP12
60.0000 mg | ORAL_CAPSULE | Freq: Two times a day (BID) | ORAL | Status: DC
  Administered 2022-02-14 – 2022-02-17 (×6): 60 mg via ORAL
  Filled 2022-02-14 (×7): qty 1

## 2022-02-14 MED ORDER — FUROSEMIDE 20 MG PO TABS
20.0000 mg | ORAL_TABLET | Freq: Two times a day (BID) | ORAL | Status: DC
  Administered 2022-02-15: 20 mg via ORAL
  Filled 2022-02-14: qty 1

## 2022-02-14 MED ORDER — CEPHALEXIN 500 MG PO CAPS
500.0000 mg | ORAL_CAPSULE | Freq: Three times a day (TID) | ORAL | Status: AC
  Administered 2022-02-15 (×3): 500 mg via ORAL
  Filled 2022-02-14 (×3): qty 1

## 2022-02-14 MED ORDER — AMIODARONE HCL 200 MG PO TABS
200.0000 mg | ORAL_TABLET | Freq: Every day | ORAL | Status: DC
  Administered 2022-02-14 – 2022-02-17 (×4): 200 mg via ORAL
  Filled 2022-02-14 (×4): qty 1

## 2022-02-14 MED ORDER — SODIUM CHLORIDE 0.9 % IV SOLN: INTRAVENOUS | Status: DC | PRN

## 2022-02-14 NOTE — Progress Notes (Signed)
Pt was agitated last night. Oriented only to himself, pt thinks he is at home and someone has stolen his property. Reoriented pt to time, place and situation but pt is not redirectable. Attempted to call wife to help with the agitation. Pt keeps on pulling out EKG leads, pulse ox and IV access. NP [place] made aware. PT's wife called and were able to speak with the pt. This interaction helped because pt agreed to take his night meds. PRN Trazodone worked for an hour then he becomes restless again. One dose of Haldol IV given. Pt was able to sleep after. Currently pt is asleep, arousable to name calling. Was not able to give his due levothyroxine due to pt is lethargic.

## 2022-02-14 NOTE — Evaluation (Signed)
Physical Therapy Evaluation Patient Details Name: [name]. MRN: [id number] DOB: Sep 21, 1955 Today's Date: 02/14/2022  History of Present Illness  66 y.o. Caucasian male with medical history significant for asthma, CHF, COPD, coronary artery disease, left BKA, depression, type 2 diabetes mellitus, gout, hypertension, dyslipidemia, OSA, who presented to the ER with acute onset of falls and generalized weakness.  Per EMS the fire department has had to come to the patient's residence about 6 times over the days leading to admission due recurrent falls.  He has been slipping off of his commode as well as his wheel chair on multiple occasions.  Clinical Impression  Pt asleep much of the morning, but did wake for PT eval late this AM.  He was pleasant, willing to participate (if somewhat confused) with PT.  Pt was able to do some bed mobility and did some side scooting with a lot of effort, but needed heavy assist with supine to sit and even more assist getting back to supine.  Attempts at standing were unsuccessful even with elevated bed and +2 assist.       Recommendations for follow up therapy are one component of a multi-disciplinary discharge planning process, led by the attending physician.  Recommendations may be updated based on patient status, additional functional criteria and insurance authorization.  Follow Up Recommendations PT at Long-term acute care hospital Can patient physically be transported by private vehicle: No    Assistance Recommended at Discharge Frequent or constant Supervision/Assistance  Patient can return home with the following  Two people to help with walking and/or transfers;Two people to help with bathing/dressing/bathroom;Assistance with cooking/housework;Direct supervision/assist for medications management;Assist for transportation;Help with stairs or ramp for entrance    Equipment Recommendations Other: Sports administrator for Other Services        Functional Status Assessment Patient has had a recent decline in their functional status and demonstrates the ability to make significant improvements in function in a reasonable and predictable amount of time.     Precautions / Restrictions Precautions Precautions: Fall Restrictions Weight Bearing Restrictions: Yes LLE Weight Bearing: Weight bearing as tolerated      Mobility  Bed Mobility Overal bed mobility: Needs Assistance Bed Mobility: Supine to Sit, Sit to Supine     Supine to sit: Mod assist, Max assist Sit to supine: Max assist, +2 for physical assistance   General bed mobility comments: Pt made good effort with getting up to sitting, however needed heavy HHA and plenty of extra time/effort.  Unable to lift LEs or control trunk getting back into bed needing +2 assist.  Pt was able to do some very small, but actual, side scooting at EOB with R LE and heavy b/l UE effort.    Transfers Overall transfer level: Needs assistance Equipment used: Rolling walker (2 wheels) Transfers: Sit to/from Stand Sit to Stand: Max assist, From elevated surface           General transfer comment: elevated bed, cued and prepped for appropriate sequencing for standing attempt but even with +2 assist he could not give enough effort through UEs and R LE to get off the bed    Ambulation/Gait                  Stairs            Wheelchair Mobility    Modified Rankin (Stroke Patients Only)       Balance Overall balance assessment: Needs assistance Sitting-balance support: Bilateral upper extremity  supported Sitting balance-Leahy Scale: Fair Sitting balance - Comments: Pt using UEs to maintain sitting balance, but once squared to EOB was able to maintain w/o direct assist after initially leaning back                                     Pertinent Vitals/Pain Pain Assessment Pain Assessment:  (reports pain as limited)    Home Living Family/patient  expects to be discharged to:: Skilled nursing facility Living Arrangements: Spouse/significant other Available Help at Discharge: Family;Available 24 hours/day Type of Home: House Home Access: Ramped entrance       Home Layout: One level Home Equipment: Conservation officer, nature (2 wheels);Cane - quad;Shower seat - built in      Prior Function Prior Level of Function : Needs assist             Mobility Comments: Pt spent >80 days at rehab and states that he had been able to stand for >10 minutes with walker, also reports that he has started being fitted for prosthetic.  He reports he has been doing well with slide board, but has fallen multiple times in the 9 days he was home       Hand Dominance        Extremity/Trunk Assessment   Upper Extremity Assessment Upper Extremity Assessment: Generalized weakness    Lower Extremity Assessment Lower Extremity Assessment: Generalized weakness (L BKA)       Communication   Communication: No difficulties  Cognition Arousal/Alertness: Awake/alert Behavior During Therapy: WFL for tasks assessed/performed Overall Cognitive Status: Difficult to assess                                 General Comments: Pt with some agitation last night, had recieved Haldol but awake on PTs arrival.  Pt unable to give consistent PLOF, reports he was "down at the garage" last night (did know he was at [facility] now though) and generally did not show a lot of insight with some residual lethargy        General Comments      Exercises     Assessment/Plan    PT Assessment Patient needs continued PT services  PT Problem List Decreased strength;Decreased activity tolerance;Decreased balance;Decreased mobility;Decreased knowledge of use of DME;Decreased safety awareness       PT Treatment Interventions DME instruction;Gait training;Functional mobility training;Therapeutic activities;Therapeutic exercise;Balance training;Cognitive  remediation;Patient/family education    PT Goals (Current goals can be found in the Care Plan section)  Acute Rehab PT Goals Patient Stated Goal: get his prosthetic PT Goal Formulation: With patient Time For Goal Achievement: 02/27/22 Potential to Achieve Goals: Fair    Frequency Min 2X/week     Co-evaluation               AM-PAC PT "6 Clicks" Mobility  Outcome Measure Help needed turning from your back to your side while in a flat bed without using bedrails?: A Lot Help needed moving from lying on your back to sitting on the side of a flat bed without using bedrails?: A Lot Help needed moving to and from a bed to a chair (including a wheelchair)?: Total Help needed standing up from a chair using your arms (e.g., wheelchair or bedside chair)?: Total Help needed to walk in hospital room?: Total Help needed climbing 3-5 steps with a railing? :  Total 6 Click Score: 8    End of Session Equipment Utilized During Treatment: Gait belt;Oxygen Activity Tolerance: Patient limited by fatigue Patient left: with bed alarm set;with call bell/phone within reach;with family/visitor present Nurse Communication: Mobility status PT Visit Diagnosis: Muscle weakness (generalized) (M62.81);Difficulty in walking, not elsewhere classified (R26.2)    Time: 0352-4818 PT Time Calculation (min) (ACUTE ONLY): 28 min   Charges:   PT Evaluation $PT Eval Moderate Complexity: 1 Mod PT Treatments $Therapeutic Activity: 8-22 mins        Kreg Shropshire, DPT 02/14/2022, 1:33 PM

## 2022-02-14 NOTE — Progress Notes (Signed)
[name] at [place] NAME: [name]    MR#:  [id number]  DATE OF BIRTH:  [birth year]  SUBJECTIVE:   Patient was confused last night. Received IV Haldol. Currently sleepy. Although did wake up in the morning ate some breakfast. No other issues. Heart rate in the 90s. No fever. Denies any respiratory distress.   VITALS:  Blood pressure (!) 110/59, pulse 98, temperature 97.6 F (36.4 C), resp. rate (!) 9, height '6\' 4"'$  (1.93 m), weight 123.4 kg, SpO2 91 %.  PHYSICAL EXAMINATION:   GENERAL:  66 y.o.-year-old patient lying in the bed with no acute distress. Chronically ill LUNGS: decreased breath sounds bilaterally, no wheezing CARDIOVASCULAR: S1, S2 normal. No murmurs, mild tachycardia ABDOMEN: Soft, nontender, nondistended. Bowel sounds present.  EXTREMITIES: left AKA stump stable. Right lower extremity chronic venous stasis changes with some old scabs. No obvious cellulitis.    NEUROLOGIC: nonfocal  patient is alert and awake SKIN: No obvious rash, lesion, or ulcer.   LABORATORY PANEL:  CBC Recent Labs  Lab 02/14/22 0558  WBC 7.4  HGB 10.9*  HCT 35.2*  PLT 159    Chemistries  Recent Labs  Lab 02/11/22 1934 02/12/22 0618 02/14/22 0558  NA 142   < > 140  K 4.6   < > 4.2  CL 109   < > 108  CO2 25   < > 26  GLUCOSE 247*   < > 240*  BUN 46*   < > 31*  CREATININE 1.19   < > 0.99  CALCIUM 9.3   < > 8.4*  MG 1.9   < > 1.9  AST 21  --   --   ALT 20  --   --   ALKPHOS 142*  --   --   BILITOT 0.5  --   --    < > = values in this interval not displayed.   Cardiac Enzymes No results for input(s): "TROPONINI" in the last 168 hours. RADIOLOGY:  DG Chest Port 1 View  Result Date: 02/13/2022 CLINICAL DATA:  Hypoxia. EXAM: PORTABLE CHEST 1 VIEW COMPARISON:  02/11/2022 FINDINGS: Stable cardiac enlargement. Progressive interstitial pulmonary edema. No definite pleural effusions or focal pulmonary infiltrates. IMPRESSION:  Progressive interstitial pulmonary edema. Electronically Signed   By: Marijo Sanes M.D.   On: 02/13/2022 15:07    Assessment and Plan Alec A Alec Situ. is a 66 y.o. Caucasian male with medical history significant for asthma, CHF, COPD, coronary artery disease, left BKA, depression, type 2 diabetes mellitus, gout, hypertension, dyslipidemia, OSA, who presented to the ER with acute onset of falls and generalized weakness.  Per EMS the fire department has had to come to the patient's residence about 6 times over the last 48 hours due to recurrent falls.  He has been slipping off of his commode as well as his wheel chair on multiple occasions.  No loss of consciousness or head injury or associated with those falls.  He has subsequent skin abrasions and wounds to his left BKA stump as well as right foot and ankle as result of his falls.  He was noted to be tachycardic with irregularly irregular rhythm before coming to the ER.  He was in atrial fibrillation with rapid ventricular sponsor.     Atrial fibrillation with rapid ventricular response (HCC) --started on dilt gtt and amiodarone gtt, and oral of both on admission --cardio consulted --maxed out on both dilt and amio gtt this  morning and still in Afib w HR 130's.  BP also soft. --s/p cardioversionon 10/17 by Dr Nehemiah Massed, with conversion to NSR, however, still tachycardic --HR 90'a. Ok to change to po diltiazem and amiodarone per Cardiology --cont Eliquis   Acute hypoxemic respiratory failure ([place]) 2/2 pulm edema --recent Echo in June 2023 showed normal LVEF and diastolic fxn.  Pt likely has heart failure 2/2 Afib w RVR. --IV lasix 40 mg x1 10/17--good uop --cont diuresis based on response--try lasix 20 mg bis --Continue supplemental O2 to keep sats >=90%, wean as tolerated   Lower extremity cellulitis/appears chronic venous stasis --received vanc/cefe/flagyl on presentation --cont vanc and ceftriaxone for now--d/c it   Uncontrolled type 2  diabetes mellitus with hyperglycemia, with long-term current use of insulin (HCC) --SSI --start glargine 12u nightly   Essential hypertension Not currently active.  BP soft.   Benign prostatic hyperplasia with lower urinary tract symptoms - continue Proscar and Flomax.   Hypothyroidism - continue Synthroid    Dyslipidemia -  continue Zetia.   Generalized weakness --PT/OT--recommends Long term rehab but pt has used up all his days   Sepsis ruled out --didn't meet criteria.  Tachycardia is 2/2 afib w RVR.   UTI, ruled out --wife believed pt has UTI because pt had AMS which wife associates with UTI. --urine cx only 20,000 colonies, and pt denied dysuria. --UC Klebsiella--given comorbidities and presentation will treat with PO Keflex total five days     DVT prophylaxis: [id number] Code Status: DNR  Family Communication: wife updated  today Level of care: Progressive Dispo:   The patient is from: home Anticipated d/c is to: home Anticipated d/c date is:1-2 days wean to room air as possible continue to monitor mental status and if remains stable discharged to home tomorrow.   Patient has multiple comorbidities and is at a high risk for frequent admission/readmission     TOTAL TIME TAKING CARE OF THIS PATIENT: 35 minutes.  >50% time spent on counselling and coordination of care  Note: This dictation was prepared with Dragon dictation along with smaller phrase technology. Any transcriptional errors that result from this process are unintentional.  [name] M.D    [facility]   CC: Primary care physician; [name], MD
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
## 2022-02-14 NOTE — Progress Notes (Signed)
OT Cancellation Note  Patient Details Name: [name]. MRN: [id number] DOB: July 22, 1955   Cancelled Treatment:    Reason Eval/Treat Not Completed: Other (comment). OT order received and chart reviewed. Per chart review and conversation with RN, pt was very restless and agitated last night and received IV haldol. Pt appears to be sleeping soundly and quite lethargic at this time. OT to re-attempt when pt is able to actively participate in OT intervention.   Darleen Crocker, MS, OTR/L , CBIS ascom [phone]  02/14/22, 9:18 AM

## 2022-02-15 DIAGNOSIS — E1165 Type 2 diabetes mellitus with hyperglycemia: Secondary | ICD-10-CM

## 2022-02-15 DIAGNOSIS — E039 Hypothyroidism, unspecified: Secondary | ICD-10-CM

## 2022-02-15 DIAGNOSIS — I4891 Unspecified atrial fibrillation: Secondary | ICD-10-CM | POA: Diagnosis not present

## 2022-02-15 DIAGNOSIS — J9601 Acute respiratory failure with hypoxia: Secondary | ICD-10-CM

## 2022-02-15 DIAGNOSIS — Z794 Long term (current) use of insulin: Secondary | ICD-10-CM

## 2022-02-15 DIAGNOSIS — E785 Hyperlipidemia, unspecified: Secondary | ICD-10-CM

## 2022-02-15 LAB — GLUCOSE, CAPILLARY
Glucose-Capillary: 123 mg/dL — ABNORMAL HIGH (ref 70–99)
Glucose-Capillary: 233 mg/dL — ABNORMAL HIGH (ref 70–99)
Glucose-Capillary: 174 mg/dL — ABNORMAL HIGH (ref 70–99)
Glucose-Capillary: 193 mg/dL — ABNORMAL HIGH (ref 70–99)

## 2022-02-15 MED ORDER — INSULIN GLARGINE-YFGN 100 UNIT/ML ~~LOC~~ SOLN
16.0000 [IU] | Freq: Every day | SUBCUTANEOUS | Status: DC
  Administered 2022-02-15 – 2022-02-16 (×2): 16 [IU] via SUBCUTANEOUS
  Filled 2022-02-15 (×3): qty 0.16

## 2022-02-15 MED ORDER — FUROSEMIDE 20 MG PO TABS: 20.0000 mg | ORAL_TABLET | Freq: Every day | ORAL | Status: DC

## 2022-02-15 MED ORDER — FUROSEMIDE 20 MG PO TABS: 40.0000 mg | ORAL_TABLET | Freq: Every day | ORAL | 0 refills | Status: DC

## 2022-02-15 MED ORDER — FUROSEMIDE 20 MG PO TABS
20.0000 mg | ORAL_TABLET | Freq: Two times a day (BID) | ORAL | Status: DC
  Administered 2022-02-15 – 2022-02-17 (×4): 20 mg via ORAL
  Filled 2022-02-15 (×4): qty 1

## 2022-02-15 MED ORDER — CEPHALEXIN 500 MG PO CAPS: 500.0000 mg | ORAL_CAPSULE | Freq: Three times a day (TID) | ORAL | 0 refills | Status: AC

## 2022-02-15 NOTE — Discharge Summary (Signed)
Physician Discharge Summary   Patient: [name]. MRN: [id number] DOB: 1956-01-12  Admit date:     02/11/2022  Discharge date: 02/15/22  Discharge Physician: Fritzi Mandes   PCP: [name], MD   Recommendations at discharge:   follow-up Dr. Doy Hutching on your scheduled appointment  Discharge Diagnoses: Principal Problem:   Atrial fibrillation with rapid ventricular response [facility]) Active Problems:   Acute hypoxemic respiratory failure ([place])   Lower extremity cellulitis   Uncontrolled type 2 diabetes mellitus with hyperglycemia, with long-term current use of insulin ([place])   Essential hypertension   Generalized weakness   Dyslipidemia   Hypothyroidism   Benign prostatic hyperplasia with lower urinary tract symptoms  Hospital Course: [name]. is a 66 y.o. Caucasian male with medical history significant for asthma, CHF, COPD, coronary artery disease, left BKA, depression, type 2 diabetes mellitus, gout, hypertension, dyslipidemia, OSA, who presented to the ER with acute onset of falls and generalized weakness.  Per EMS the fire department has had to come to the patient's residence about 6 times over the last 48 hours due to recurrent falls.  He has been slipping off of his commode as well as his wheel chair on multiple occasions.  No loss of consciousness or head injury or associated with those falls.  He has subsequent skin abrasions and wounds to his left BKA stump as well as right foot and ankle as result of his falls.  He was noted to be tachycardic with irregularly irregular rhythm before coming to the ER.  He was in atrial fibrillation with rapid ventricular sponsor.     Atrial fibrillation with rapid ventricular response (HCC) --started on dilt gtt and amiodarone gtt, and oral of both on admission --cardio consulted --maxed out on both dilt and amio gtt this morning and still in Afib w HR 130's.  BP also soft. --s/p cardioversionon 10/17 by Dr Nehemiah Massed,  with conversion to NSR, however, still tachycardic --HR 90'a. Ok to change to po diltiazem and amiodarone per Cardiology --cont Eliquis   Acute hypoxemic respiratory failure (HCC) 2/2 pulm edema, acute on chronic congestive heart failure diastolic --recent Echo in June 2023 showed normal LVEF and diastolic fxn.  Pt likely has heart failure 2/2 Afib w RVR. --IV lasix 40 mg x1 10/17--good uop --cont diuresis based on response--lasix 40 mg qd --Continue supplemental O2 to keep sats >=90%, wean as tolerated -- patient will follow-up with Dr. Ubaldo Glassing in [facility] clinic. -- Patient will be set up with home oxygen. He desaturated on room air. -- Patient advised to use CPAP at home   Lower extremity cellulitis/appears chronic venous stasis --received vanc/cefe/flagyl on presentation --cont vanc and ceftriaxone for now--d/c it -- On 02/15/2022   Uncontrolled type 2 diabetes mellitus with hyperglycemia, with long-term current use of insulin (HCC) --SSI --resume home dose insulin and metformin   Essential hypertension Not currently active.  BP soft.   Benign prostatic hyperplasia with lower urinary tract symptoms - continue Proscar and Flomax.   Hypothyroidism - continue Synthroid    Dyslipidemia -  continue Zetia.   Generalized weakness --PT/OT--recommends Long term rehab but pt has used up all his days   Sepsis ruled out --didn't meet criteria.  Tachycardia is 2/2 afib w RVR.   UTI, ruled out --wife believed pt has UTI because pt had AMS which wife associates with UTI. --urine cx only 20,000 colonies, and pt denied dysuria. --UC Klebsiella--given comorbidities and presentation will treat with PO  Keflex total five days     DVT prophylaxis: [id number] Code Status: DNR  Family Communication: wife updated  today Level of care: Progressive Dispo:   The patient is from: home Anticipated d/c is to: home Anticipated d/c date SE:GBTDV  Overall improving. Patient has  multiple comorbidities and is in a high risk for readmission.      Consultants: [facility] cardiology Procedures performed: CVV  Disposition: Home health Diet recommendation:  Discharge Diet Orders (From admission, onward)     Start     Ordered   02/15/22 0000  Diet - low sodium heart healthy        02/15/22 1245           Cardiac and Carb modified diet DISCHARGE MEDICATION: Allergies as of 02/15/2022       Reactions   Penicillins Other (See Comments)   Happened at 66 years old and pt. stated he passed out  He has tolerated amoxicillin/clavulanate and ampicillin/sulbactam   Statins    Other reaction(s): Muscle Pain Causes legs to ache per pt        Medication List     STOP taking these medications    nitroGLYCERIN 0.4 MG SL tablet Commonly known as: NITROSTAT       TAKE these medications    acetaminophen 325 MG tablet Commonly known as: TYLENOL Take 2 tablets (650 mg total) by mouth every 6 (six) hours as needed for mild pain (or Fever >/= 101).   amiodarone 200 MG tablet Commonly known as: PACERONE Take 200 mg by mouth daily.   apixaban 5 MG Tabs tablet Commonly known as: ELIQUIS Take 1 tablet (5 mg total) by mouth 2 (two) times daily.   ascorbic acid 500 MG tablet Commonly known as: VITAMIN C Take 500 mg by mouth daily.   aspirin EC 81 MG tablet Take 1 tablet (81 mg total) by mouth daily. Swallow whole.   budesonide 0.5 MG/2ML nebulizer solution Commonly known as: PULMICORT Take 2 mLs (0.5 mg total) by nebulization 2 (two) times daily.   cephALEXin 500 MG capsule Commonly known as: KEFLEX Take 1 capsule (500 mg total) by mouth every 8 (eight) hours for 3 doses.   Cholecalciferol 25 MCG (1000 UT) tablet Take 1,000 Units by mouth daily.   diltiazem 120 MG 12 hr capsule Commonly known as: CARDIZEM SR Take 1 capsule (120 mg total) by mouth every 12 (twelve) hours.   ezetimibe 10 MG tablet Commonly known as: ZETIA Take 10 mg by mouth at  bedtime.   ferrous sulfate 325 (65 FE) MG tablet Take 325 mg by mouth daily with breakfast.   finasteride 5 MG tablet Commonly known as: PROSCAR Take 1 tablet (5 mg total) by mouth daily.   furosemide 20 MG tablet Commonly known as: LASIX Take 2 tablets (40 mg total) by mouth daily.   insulin aspart 100 UNIT/ML injection Commonly known as: novoLOG Inject 0-20 Units into the skin 3 (three) times daily with meals.   insulin glargine-yfgn 100 UNIT/ML injection Commonly known as: SEMGLEE Inject 0.25 mLs (25 Units total) into the skin at bedtime. What changed: how much to take   levothyroxine 25 MCG tablet Commonly known as: SYNTHROID Take 1 tablet (25 mcg total) by mouth daily at 6 (six) AM.   metFORMIN 1000 MG tablet Commonly known as: GLUCOPHAGE Take 1,000 mg by mouth 2 (two) times daily.   multivitamin with minerals tablet Take 1 tablet by mouth daily.   nystatin powder Commonly known as: MYCOSTATIN/NYSTOP  Apply topically 2 (two) times daily.   primidone 250 MG tablet Commonly known as: MYSOLINE Take 250 mg by mouth 2 (two) times daily.   risperiDONE 0.5 MG tablet Commonly known as: RISPERDAL Take 1 tablet (0.5 mg total) by mouth 2 (two) times daily.   tamsulosin 0.4 MG Caps capsule Commonly known as: FLOMAX Take 0.4 mg by mouth daily.   traMADol 50 MG tablet Commonly known as: ULTRAM Take 1 tablet (50 mg total) by mouth daily as needed for moderate pain.   Vitamin B-12 5000 MCG Tbdp Take 10,000 mcg by mouth daily.               Durable Medical Equipment  (From admission, onward)           Start     Ordered   02/15/22 1011  For home use only DME oxygen  Once       Question Answer Comment  Length of Need Lifetime   Mode or (Route) Nasal cannula   Liters per Minute 2   Oxygen conserving device Yes   Oxygen delivery system Gas      02/15/22 1011   02/15/22 0959  For home use only DME Other see comment  Once       Comments: Harrel Lemon lift   Question:  Length of Need  Answer:  Lifetime   02/15/22 0958              Discharge Care Instructions  (From admission, onward)           Start     Ordered   02/15/22 0000  Discharge wound care:       Comments: Foam pad dressing on affected wounds in right LE   02/15/22 1245            Follow-up Information     Sparks, Leonie Douglas, MD. Go to.   Specialty: Internal Medicine Why: on your appt in Nov Contact information: [place] [address] [phone]         Teodoro Spray, MD. Schedule an appointment as soon as possible for a visit in 1 week(s).   Specialty: Cardiology Why: afib with rvr Contact information: [address] [place] [address] [phone]                Discharge Exam: Danley Danker Weights   02/11/22 1929 02/15/22 0500  Weight: 123.4 kg (!) 146.9 kg     Condition at discharge: fair  The results of significant diagnostics from this hospitalization (including imaging, microbiology, ancillary and laboratory) are listed below for reference.   Imaging Studies: DG Chest Port 1 View  Result Date: 02/13/2022 CLINICAL DATA:  Hypoxia. EXAM: PORTABLE CHEST 1 VIEW COMPARISON:  02/11/2022 FINDINGS: Stable cardiac enlargement. Progressive interstitial pulmonary edema. No definite pleural effusions or focal pulmonary infiltrates. IMPRESSION: Progressive interstitial pulmonary edema. Electronically Signed   By: Marijo Sanes M.D.   On: 02/13/2022 15:07   CT Head Wo Contrast  Result Date: 02/11/2022 CLINICAL DATA:  Head and neck trauma. Multiple falls over the past few days. EXAM: CT HEAD WITHOUT CONTRAST CT CERVICAL SPINE WITHOUT CONTRAST TECHNIQUE: Multidetector CT imaging of the head and cervical spine was performed following the standard protocol without intravenous contrast. Multiplanar CT image reconstructions of the cervical spine were also generated. RADIATION DOSE REDUCTION: This exam was  performed according to the departmental dose-optimization program which includes automated exposure control, adjustment of the mA and/or kV according  to patient size and/or use of iterative reconstruction technique. COMPARISON:  None Available. FINDINGS: CT HEAD FINDINGS Brain: No evidence of acute infarction, hemorrhage, hydrocephalus, extra-axial collection or mass lesion/mass effect. Scattered areas of low attenuation suggesting chronic microvascular ischemic changes. Vascular: No hyperdense vessel or unexpected calcification. Skull: Normal. Negative for fracture or focal lesion. Sinuses/Orbits: No acute finding. Other: None. CT CERVICAL SPINE FINDINGS Alignment: Straightening of the cervical spine. Skull base and vertebrae: No acute fracture. No primary bone lesion or focal pathologic process. Soft tissues and spinal canal: No prevertebral fluid or swelling. No visible canal hematoma. Disc levels: C2-C3:  No significant findings. C3-C4: Disc height loss and uncovertebral joint arthropathy with moderate bilateral neural foraminal stenosis. C4-C5: Mild uncovertebral joint arthropathy with mild right neural foraminal stenosis. C5-C6:  No significant findings. C6-C7: Disc height loss and severe uncovertebral joint arthropathy with moderate left and severe right neural foraminal stenosis. C7-T1: Disc height loss and uncovertebral joint arthropathy with severe bilateral neural foraminal stenosis. Upper chest: Prominent atherosclerotic calcification of aorta and branch vessels. Other: None IMPRESSION: CT HEAD: 1. No acute intracranial abnormality. 2. Chronic microvascular ischemic changes of the white matter. CT CERVICAL SPINE: 1. No acute fracture or traumatic subluxation. 2. Moderate to severe multilevel degenerative disc disease and uncovertebral joint arthropathy with severe neural foraminal stenosis at C6-C7 and C7-T1. 3. Aortic atherosclerosis. Electronically Signed   By: Keane Police D.O.   On: 02/11/2022 20:58    CT Cervical Spine Wo Contrast  Result Date: 02/11/2022 CLINICAL DATA:  Head and neck trauma. Multiple falls over the past few days. EXAM: CT HEAD WITHOUT CONTRAST CT CERVICAL SPINE WITHOUT CONTRAST TECHNIQUE: Multidetector CT imaging of the head and cervical spine was performed following the standard protocol without intravenous contrast. Multiplanar CT image reconstructions of the cervical spine were also generated. RADIATION DOSE REDUCTION: This exam was performed according to the departmental dose-optimization program which includes automated exposure control, adjustment of the mA and/or kV according to patient size and/or use of iterative reconstruction technique. COMPARISON:  None Available. FINDINGS: CT HEAD FINDINGS Brain: No evidence of acute infarction, hemorrhage, hydrocephalus, extra-axial collection or mass lesion/mass effect. Scattered areas of low attenuation suggesting chronic microvascular ischemic changes. Vascular: No hyperdense vessel or unexpected calcification. Skull: Normal. Negative for fracture or focal lesion. Sinuses/Orbits: No acute finding. Other: None. CT CERVICAL SPINE FINDINGS Alignment: Straightening of the cervical spine. Skull base and vertebrae: No acute fracture. No primary bone lesion or focal pathologic process. Soft tissues and spinal canal: No prevertebral fluid or swelling. No visible canal hematoma. Disc levels: C2-C3:  No significant findings. C3-C4: Disc height loss and uncovertebral joint arthropathy with moderate bilateral neural foraminal stenosis. C4-C5: Mild uncovertebral joint arthropathy with mild right neural foraminal stenosis. C5-C6:  No significant findings. C6-C7: Disc height loss and severe uncovertebral joint arthropathy with moderate left and severe right neural foraminal stenosis. C7-T1: Disc height loss and uncovertebral joint arthropathy with severe bilateral neural foraminal stenosis. Upper chest: Prominent atherosclerotic calcification of aorta  and branch vessels. Other: None IMPRESSION: CT HEAD: 1. No acute intracranial abnormality. 2. Chronic microvascular ischemic changes of the white matter. CT CERVICAL SPINE: 1. No acute fracture or traumatic subluxation. 2. Moderate to severe multilevel degenerative disc disease and uncovertebral joint arthropathy with severe neural foraminal stenosis at C6-C7 and C7-T1. 3. Aortic atherosclerosis. Electronically Signed   By: Keane Police D.O.   On: 02/11/2022 20:58   DG Ankle Complete Right  Result Date: 02/11/2022 CLINICAL DATA:  Fall,  swelling EXAM: RIGHT ANKLE - COMPLETE 3+ VIEW COMPARISON:  None Available. FINDINGS: Plantar and posterior calcaneal spurs. No acute bony abnormality. Specifically, no fracture, subluxation, or dislocation. Soft tissues are intact. IMPRESSION: No acute bony abnormality. Electronically Signed   By: Rolm Baptise M.D.   On: 02/11/2022 20:08   DG Foot 2 Views Right  Result Date: 02/11/2022 CLINICAL DATA:  Weakness, fall EXAM: RIGHT FOOT - 2 VIEW COMPARISON:  None Available. FINDINGS: Prior amputation of the 2nd and 3rd toes. No acute bony abnormality. Specifically, no fracture, subluxation, or dislocation. Soft tissues are intact. No soft tissue gas. Plantar and posterior calcaneal spur. IMPRESSION: No acute bony abnormality. Electronically Signed   By: Rolm Baptise M.D.   On: 02/11/2022 20:07   DG Chest Port 1 View  Result Date: 02/11/2022 CLINICAL DATA:  Questionable sepsis. EXAM: PORTABLE CHEST 1 VIEW COMPARISON:  10/25/2021 FINDINGS: Prior CABG. Cardiomegaly. No confluent opacities, effusions or edema. No acute bony abnormality. IMPRESSION: Cardiomegaly.  No active disease. Electronically Signed   By: Rolm Baptise M.D.   On: 02/11/2022 20:07    Microbiology: Results for orders placed or performed during the hospital encounter of 02/11/22  Resp Panel by RT-PCR (Flu A&B, Covid) Anterior Nasal Swab     Status: None   Collection Time: 02/11/22  7:34 PM   Specimen:  Anterior Nasal Swab  Result Value Ref Range Status   SARS Coronavirus 2 by RT PCR NEGATIVE NEGATIVE Final    Comment: (NOTE) SARS-CoV-2 target nucleic acids are NOT DETECTED.  The SARS-CoV-2 RNA is generally detectable in upper respiratory specimens during the acute phase of infection. The lowest concentration of SARS-CoV-2 viral copies this assay can detect is 138 copies/mL. A negative result does not preclude SARS-Cov-2 infection and should not be used as the sole basis for treatment or other patient management decisions. A negative result may occur with  improper specimen collection/handling, submission of specimen other than nasopharyngeal swab, presence of viral mutation(s) within the areas targeted by this assay, and inadequate number of viral copies(<138 copies/mL). A negative result must be combined with clinical observations, patient history, and epidemiological information. The expected result is Negative.  Fact Sheet for Patients:  EntrepreneurPulse.com.au  Fact Sheet for Healthcare Providers:  IncredibleEmployment.be  This test is no t yet approved or cleared by the [place] FDA and  has been authorized for detection and/or diagnosis of SARS-CoV-2 by FDA under an Emergency Use Authorization (EUA). This EUA will remain  in effect (meaning this test can be used) for the duration of the COVID-19 declaration under Section 564(b)(1) of the Act, 21 U.S.C.section 360bbb-3(b)(1), unless the authorization is terminated  or revoked sooner.       Influenza A by PCR NEGATIVE NEGATIVE Final   Influenza B by PCR NEGATIVE NEGATIVE Final    Comment: (NOTE) The Xpert Xpress SARS-CoV-2/FLU/RSV plus assay is intended as an aid in the diagnosis of influenza from Nasopharyngeal swab specimens and should not be used as a sole basis for treatment. Nasal washings and aspirates are unacceptable for Xpert Xpress SARS-CoV-2/FLU/RSV testing.  Fact  Sheet for Patients: EntrepreneurPulse.com.au  Fact Sheet for Healthcare Providers: IncredibleEmployment.be  This test is not yet approved or cleared by the [place] FDA and has been authorized for detection and/or diagnosis of SARS-CoV-2 by FDA under an Emergency Use Authorization (EUA). This EUA will remain in effect (meaning this test can be used) for the duration of the COVID-19 declaration under Section 564(b)(1) of the Act, 21  U.S.C. section 360bbb-3(b)(1), unless the authorization is terminated or revoked.  Performed at [facility], [address] [place], [place] [address]   Blood Culture (routine x 2)     Status: None (Preliminary result)   Collection Time: 02/11/22  7:34 PM   Specimen: BLOOD  Result Value Ref Range Status   Specimen Description BLOOD BLOOD LEFT HAND  Final   Special Requests   Final    BOTTLES DRAWN AEROBIC AND ANAEROBIC Blood Culture adequate volume   Culture   Final    NO GROWTH 4 DAYS Performed at [facility], [address], [place], [place] [address]    Report Status PENDING  Incomplete  Blood Culture (routine x 2)     Status: None (Preliminary result)   Collection Time: 02/11/22  7:34 PM   Specimen: Right Antecubital; Blood  Result Value Ref Range Status   Specimen Description RIGHT ANTECUBITAL  Final   Special Requests   Final    BOTTLES DRAWN AEROBIC AND ANAEROBIC Blood Culture adequate volume   Culture   Final    NO GROWTH 4 DAYS Performed at [facility], [address], [place], [place] [address]    Report Status PENDING  Incomplete  Urine Culture     Status: Abnormal   Collection Time: 02/11/22 10:40 PM   Specimen: In/Out Cath Urine  Result Value Ref Range Status   Specimen Description   Final    IN/OUT CATH URINE Performed at [facility], [address], [place], [place] [address]    Special Requests   Final    NONE Performed at [facility], [address], [place], [place] [address]    Culture (A)  Final    20,000 COLONIES/mL KLEBSIELLA PNEUMONIAE NORMAL SKIN FLORA Performed at [facility], [place] [address] [place], [place] [address]    Report Status 02/14/2022 FINAL  Final   Organism ID, Bacteria KLEBSIELLA PNEUMONIAE (A)  Final      Susceptibility   Klebsiella pneumoniae - MIC*    AMPICILLIN >=32 RESISTANT Resistant     CEFAZOLIN <=4 SENSITIVE Sensitive     CEFEPIME <=0.12 SENSITIVE Sensitive     CEFTRIAXONE <=0.25 SENSITIVE Sensitive     CIPROFLOXACIN <=0.25 SENSITIVE Sensitive     GENTAMICIN <=1 SENSITIVE Sensitive     IMIPENEM <=0.25 SENSITIVE Sensitive     NITROFURANTOIN 64 INTERMEDIATE Intermediate     TRIMETH/SULFA <=20 SENSITIVE Sensitive     AMPICILLIN/SULBACTAM 8 SENSITIVE Sensitive     PIP/TAZO <=4 SENSITIVE Sensitive     * 20,000 COLONIES/mL KLEBSIELLA PNEUMONIAE    Labs: CBC: Recent Labs  Lab 02/11/22 1934 02/12/22 0618 02/13/22 0625 02/14/22 0558  WBC 8.1 7.1 6.8 7.4  NEUTROABS 5.8  --   --   --   HGB 12.0* 11.4* 10.8* 10.9*  HCT 39.3 37.7* 34.7* 35.2*  MCV 94.0 94.3 91.1 92.1  PLT 199 155 180 034   Basic Metabolic Panel: Recent Labs  Lab 02/11/22 1934 02/12/22 0618 02/13/22 0625 02/14/22 0558  NA 142 140 139 140  K 4.6 4.3 4.2 4.2  CL 109 110 108 108  CO2 '25 24 23 26  '$ GLUCOSE 247* 229* 194* 240*  BUN 46* 41* 38* 31*  CREATININE 1.19 0.91 1.02 0.99  CALCIUM 9.3 8.6* 8.7* 8.4*  MG 1.9  --  1.9 1.9   Liver Function Tests: Recent Labs  Lab 02/11/22 1934  AST 21  ALT 20  ALKPHOS 142*  BILITOT 0.5  PROT 7.9  ALBUMIN 4.1   CBG: Recent Labs  Lab 02/14/22 0747 02/14/22 1201 02/14/22 1635 02/14/22 2145 02/15/22 0820  GLUCAP 196* 254* 193* 207* 123*    Discharge time spent: greater than 30 minutes.  Signed: Fritzi Mandes, MD [facility] 02/15/2022

## 2022-02-15 NOTE — Progress Notes (Signed)
Physical Therapy Treatment Patient Details Name: [name]. MRN: [id number] DOB: 06/01/1955 Today's Date: 02/15/2022   History of Present Illness 66 y.o. Caucasian male with medical history significant for asthma, CHF, COPD, coronary artery disease, left BKA, depression, type 2 diabetes mellitus, gout, hypertension, dyslipidemia, OSA, who presented to the ER with acute onset of falls and generalized weakness.  Per EMS the fire department has had to come to the patient's residence about 6 times over the days leading to admission due recurrent falls.  He has been slipping off of his commode as well as his wheel chair and while trying to transfer on multiple occasions.    PT Comments    Pt more awake today, somewhat fatigued from working with OT earlier but ultimately eager to try some mobility with PT.  He continued to need considerable assist with getting to EOB, but did do vast majority of the effort with low and labored effort, more limited with attempts to get back to bed needing considerable assist with getting back to supine - good effort with scooting up in bed.  Pt eager to attempt slide board transfer to recliner (arm dropped) and though he was able to make laborious initial small scoots it was evident that he did not have the strength to manage w/o total assist, further attempt aborted and assisted back fully onto bed and then back to supine.     Recommendations for follow up therapy are one component of a multi-disciplinary discharge planning process, led by the attending physician.  Recommendations may be updated based on patient status, additional functional criteria and insurance authorization.  Follow Up Recommendations   (HHPT if pt is going home) Can patient physically be transported by private vehicle: No   Assistance Recommended at Discharge Frequent or constant Supervision/Assistance  Patient can return home with the following Two people to help with walking and/or  transfers;Two people to help with bathing/dressing/bathroom;Assistance with cooking/housework;Direct supervision/assist for medications management;Assist for transportation;Help with stairs or ramp for entrance   Equipment Recommendations  Other (comment) Optician, dispensing)    Recommendations for Other Services       Precautions / Restrictions Precautions Precautions: Fall Restrictions LLE Weight Bearing: Weight bearing as tolerated     Mobility  Bed Mobility Overal bed mobility: Needs Assistance Bed Mobility: Supine to Sit, Sit to Supine     Supine to sit: Min assist Sit to supine: Mod assist   General bed mobility comments: Pt made good effort with getting up to sitting, however needed heavy HHA and plenty of extra time/effort.  Unable to lift LEs or control trunk getting back into bed needing +2 assist.    Transfers Overall transfer level: Needs assistance Equipment used: Sliding board Transfers: Bed to chair/wheelchair/BSC            Lateral/Scoot Transfers: Mod assist, Max assist General transfer comment: Pt feeling that be may be able to transfer to recliner (arm dropped) with slide board, he did show great effort and was able to initiate some lateral movement but simply did not have the UE strength to really get more than a few inches before realizing that he could not safely/succesfully do it w/o total assist.  Deferred full transfer and back to bed after valient but very functionally limited effort.    Ambulation/Gait                   Stairs             Wheelchair  Mobility    Modified Rankin (Stroke Patients Only)       Balance Overall balance assessment: Needs assistance Sitting-balance support: Bilateral upper extremity supported Sitting balance-Leahy Scale: Good                                      Cognition Arousal/Alertness: Awake/alert Behavior During Therapy: WFL for tasks assessed/performed Overall Cognitive  Status: Within Functional Limits for tasks assessed                                 General Comments: Pt much more alert and "with it" today        Exercises      General Comments General comments (skin integrity, edema, etc.): Pt awake and motivated today, showed great effort with some minimal side scooting and initiated slideboard to recliner - but ultimately unsafe and attempt was aborted      Pertinent Vitals/Pain Pain Assessment Pain Assessment: No/denies pain    Home Living                          Prior Function            PT Goals (current goals can now be found in the care plan section) Progress towards PT goals: Progressing toward goals    Frequency    Min 2X/week      PT Plan Current plan remains appropriate    Co-evaluation              AM-PAC PT "6 Clicks" Mobility   Outcome Measure  Help needed turning from your back to your side while in a flat bed without using bedrails?: A Lot Help needed moving from lying on your back to sitting on the side of a flat bed without using bedrails?: A Lot Help needed moving to and from a bed to a chair (including a wheelchair)?: Total Help needed standing up from a chair using your arms (e.g., wheelchair or bedside chair)?: Total Help needed to walk in hospital room?: Total Help needed climbing 3-5 steps with a railing? : Total 6 Click Score: 8    End of Session   Activity Tolerance: Patient limited by fatigue Patient left: with bed alarm set;with call bell/phone within reach;with family/visitor present Nurse Communication: Mobility status PT Visit Diagnosis: Muscle weakness (generalized) (M62.81);Difficulty in walking, not elsewhere classified (R26.2)     Time: 7654-6503 PT Time Calculation (min) (ACUTE ONLY): 18 min  Charges:  $Therapeutic Activity: 8-22 mins                     Kreg Shropshire, DPT 02/15/2022, 6:47 PM

## 2022-02-15 NOTE — Progress Notes (Addendum)
SATURATION QUALIFICATIONS: (This note is used to comply with regulatory documentation for home oxygen)  Patient Saturations on Room Air at Rest = 88%  Patient Saturations on 2 Liters of oxygen while Ambulating = 94%  Please briefly explain why patient needs home oxygen:

## 2022-02-15 NOTE — Evaluation (Signed)
Occupational Therapy Evaluation Patient Details Name: [name]. MRN: [id number] DOB: 01-21-1956 Today's Date: 02/15/2022   History of Present Illness 66 y.o. Caucasian male with medical history significant for asthma, CHF, COPD, coronary artery disease, left BKA, depression, type 2 diabetes mellitus, gout, hypertension, dyslipidemia, OSA, who presented to the ER with acute onset of falls and generalized weakness.  Per EMS the fire department has had to come to the patient's residence about 6 times over the days leading to admission due recurrent falls.  He has been slipping off of his commode as well as his wheel chair on multiple occasions.   Clinical Impression   Patient presenting with decreased independence of self-care, functional mobility, safety, and strength/endurance. Patient very pleasant and agreeable to OT treatment. Patient oriented to self and able to answer other orientation questions with cuing. Patient reports he lives at home with his wife and has a built-in bench, BSC, QC, RW, and WC. Several falls in the last 6 months and requires assistance at baseline. Patient currently functioning at mod-max A +2 for bed mobility supine >sitting EOB. While sitting EOB patient was able to complete grooming tasks (washing face) with set-up. Patients orthostatics check and monitored throughout session: (Supine) BP: 99/55, MAP: 70, HR: 101, SP02: 95% (2L SP02), RR: 19. (Sitting EOB) 125/84, MAP: 96, HR: 108, SP02: 92 (2L SP02), RR: 16. Patient required mod A for sit>supine. Drop arm commode and Hoyer life recommended. Patient left in bed with bed alarm set, call bell in reach, and all needs met. Patient will benefit from acute OT to increase overall independence in the areas of ADLs, functional mobility, in order to safely discharge to the next venue of care.       Recommendations for follow up therapy are one component of a multi-disciplinary discharge planning process, led by the  attending physician.  Recommendations may be updated based on patient status, additional functional criteria and insurance authorization.   Follow Up Recommendations  OT at Long-term acute care hospital    Assistance Recommended at Discharge Frequent or constant Supervision/Assistance  Patient can return home with the following Two people to help with bathing/dressing/bathroom;Two people to help with walking and/or transfers;Direct supervision/assist for medications management;Assistance with cooking/housework;Help with stairs or ramp for entrance;Direct supervision/assist for financial management    Functional Status Assessment  Patient has had a recent decline in their functional status and demonstrates the ability to make significant improvements in function in a reasonable and predictable amount of time.  Equipment Recommendations  Other (comment) Optician, dispensing)       Precautions / Restrictions Precautions Precautions: Fall Restrictions Weight Bearing Restrictions: Yes LLE Weight Bearing: Weight bearing as tolerated      Mobility Bed Mobility Overal bed mobility: Needs Assistance Bed Mobility: Supine to Sit, Sit to Supine     Supine to sit: Mod assist, Max assist, +2 for physical assistance Sit to supine: +2 for physical assistance, Mod assist        Transfers                   General transfer comment: not attempted      Balance Overall balance assessment: Needs assistance Sitting-balance support: Bilateral upper extremity supported Sitting balance-Leahy Scale: Fair                                     ADL either performed or assessed with  clinical judgement   ADL Overall ADL's : Needs assistance/impaired     Grooming: Wash/dry face;Set up                                        Pertinent Vitals/Pain Pain Assessment Pain Assessment: No/denies pain     Hand Dominance Right   Extremity/Trunk Assessment Upper  Extremity Assessment Upper Extremity Assessment: Generalized weakness   Lower Extremity Assessment Lower Extremity Assessment: Generalized weakness          Cognition Arousal/Alertness: Awake/alert Behavior During Therapy: WFL for tasks assessed/performed Overall Cognitive Status: No family/caregiver present to determine baseline cognitive functioning                                 General Comments: Patient only oriented to self. Able to correctly answer other questions with cues.                Home Living Family/patient expects to be discharged to:: Other (Comment) (Long term acute care.) Living Arrangements: Spouse/significant other Available Help at Discharge: Family;Available 24 hours/day Type of Home: House Home Access: Ramped entrance     Home Layout: One level     Bathroom Shower/Tub: Tub/shower unit;Sponge bathes at baseline   Bathroom Toilet: Handicapped height     Home Equipment: Conservation officer, nature (2 wheels);Cane - quad;Shower seat - built in          Prior Functioning/Environment Prior Level of Function : Needs assist             Mobility Comments: Pt spent >80 days at rehab and states that he had been able to stand for >10 minutes with walker, also reports that he has started being fitted for prosthetic.  He reports he has been doing well with slide board, but has fallen multiple times in the 9 days he was home          OT Problem List: Decreased strength;Impaired balance (sitting and/or standing);Decreased safety awareness;Decreased activity tolerance;Decreased knowledge of use of DME or AE         OT Goals(Current goals can be found in the care plan section) Acute Rehab OT Goals Patient Stated Goal: to get stronger OT Goal Formulation: With patient Time For Goal Achievement: 03/01/22 Potential to Achieve Goals: Fair ADL Goals Pt Will Perform Lower Body Bathing: with adaptive equipment;sitting/lateral leans;bed level;with mod  assist Pt Will Perform Lower Body Dressing: with adaptive equipment;sitting/lateral leans;with mod assist Pt Will Transfer to Toilet: with transfer board;with mod assist Pt Will Perform Toileting - Clothing Manipulation and hygiene: with mod assist;with adaptive equipment;sitting/lateral leans  OT Frequency: Min 2X/week       AM-PAC OT "6 Clicks" Daily Activity     Outcome Measure Help from another person eating meals?: None Help from another person taking care of personal grooming?: A Lot Help from another person toileting, which includes using toliet, bedpan, or urinal?: A Lot Help from another person bathing (including washing, rinsing, drying)?: A Lot Help from another person to put on and taking off regular upper body clothing?: A Little Help from another person to put on and taking off regular lower body clothing?: A Lot 6 Click Score: 15   End of Session Nurse Communication: Mobility status  Activity Tolerance: Patient tolerated treatment well Patient left: in bed;with call bell/phone within reach;with bed alarm  set  OT Visit Diagnosis: Unsteadiness on feet (R26.81);Repeated falls (R29.6);Muscle weakness (generalized) (M62.81)                Time: 7837-5423 OT Time Calculation (min): 30 min Charges:  OT General Charges $OT Visit: 1 Visit OT Evaluation $OT Eval Moderate Complexity: 1 Mod OT Treatments $Therapeutic Activity: 8-22 mins    John Vasconcelos, OTS 02/15/2022, 1:44 PM

## 2022-02-15 NOTE — Progress Notes (Signed)
  Inpatient Rehab Admissions Coordinator :  Per [facility] SW request, patient was screened for CIR candidacy by Danne Baxter RN MSN.  At this time patient he is not at a level to tolerate the intensity required of CIR/AIR level rehab nor a diagnosis that would likely approve AIR level rehab.  Noted likely to have used all of his SNF days per SW. Other rehab venues should be pursued.  Danne Baxter RN MSN Admissions Coordinator [phone]

## 2022-02-15 NOTE — Progress Notes (Signed)
Tried to wean off 2L, but patient O2 dropped to 88%.  Patient is wheel chair bound (unable to do walking saturations).  Placed back on O2 at 2L and O2 improved to 94%

## 2022-02-15 NOTE — TOC Progression Note (Addendum)
Transition of Care (TOC) - Progression Note    Patient Details  Name: [name]. MRN: [id number] Date of Birth: [birth year]  Transition of Care [facility]) CM/SW [place], LCSW Phone Number: 02/15/2022, 10:29 AM  Clinical Narrative: Called wife regarding hoyer lift and oxygen. No DME agency preference. Ordered through [employer] and requested oxygen be delivered to the home as well since patient will more than likely require EMS transport home. Address on facesheet is correct. Wife asked about potential CIR but per admissions coordinator, he does not meet criteria for insurance to approve.     1:02 pm: Received call from [employer]. They left voicemail for wife regarding past-due balance and rental fees for hoyer lift and oxygen and need a card on file to work out payment plan before they can deliver DME. Wife tried calling them back but was told no one was available until after 1:00. She will call CSW back once she talks to someone at [facility].  4:03 pm: [employer] and wife still trying to work out payment before DME can be delivered. Wife said they keep telling her there is no open invoice.  Expected Discharge Plan: [place] Barriers to Discharge: Continued Medical Work up  Expected Discharge Plan and Services Expected Discharge Plan: [place] Acute Care Choice: Resumption of Svcs/PTA Provider Living arrangements for the past 2 months: Single Family Home                                       Social Determinants of Health (SDOH) Interventions    Readmission Risk Interventions    02/13/2022    4:57 PM 09/29/2021   11:12 AM  Readmission Risk Prevention Plan  Transportation Screening Complete Complete  PCP or Specialist Appt within 3-5 Days  Complete  HRI or [place]  Complete  Social Work Consult for [place] Planning/Counseling  Complete  Palliative Care Screening  Not Applicable  Medication Review Designer, fashion/clothing) Complete Complete  PCP or Specialist appointment within 3-5 days of discharge Complete   HRI or [place] Complete   SW Recovery Care/Counseling Consult Complete   [place] Not Applicable

## 2022-02-15 NOTE — Care Management Important Message (Signed)
Important Message  Patient Details  Name: [name]. MRN: [id number] Date of Birth: [birth year]   Medicare Important Message Given:  Yes  Patient asleep upon time of visit, no family in room.  Copy of Medicare IM left in room for reference.   [name] 02/15/2022, 3:33 PM

## 2022-02-16 LAB — CULTURE, BLOOD (ROUTINE X 2)
Culture: NO GROWTH
Culture: NO GROWTH
Special Requests: ADEQUATE
Special Requests: ADEQUATE

## 2022-02-16 LAB — GLUCOSE, CAPILLARY
Glucose-Capillary: 182 mg/dL — ABNORMAL HIGH (ref 70–99)
Glucose-Capillary: 140 mg/dL — ABNORMAL HIGH (ref 70–99)
Glucose-Capillary: 288 mg/dL — ABNORMAL HIGH (ref 70–99)
Glucose-Capillary: 238 mg/dL — ABNORMAL HIGH (ref 70–99)
Glucose-Capillary: 202 mg/dL — ABNORMAL HIGH (ref 70–99)

## 2022-02-16 NOTE — TOC Progression Note (Addendum)
Transition of Care (TOC) - Progression Note    Patient Details  Name: [name]. MRN: [id number] Date of Birth: [birth year]  Transition of Care [facility]) CM/SW [place], LCSW Phone Number: 02/16/2022, 9:44 AM  Clinical Narrative:   Received voicemail from wife asking about getting chux pads. Per [employer] liaison, only Medicaid will pay for these if it is the primary insurance. Left voicemail for wife to notify and encouraged her to look at [employer], [employer]ie Port[employer], [employer], etc. Per [employer] liaison, hoyer lift and oxygen are scheduled for delivery. In voicemail to wife, asked her to call once DME in place and will set up EMS transport.  11:50 am: DME not delivered yet.  3:39 pm: Left voicemail for wife to see if there were any updates on delivery time. [employer] liaison said someone from their agency is on the phone with her now.  4:29 pm: DME still not delivered.  Expected Discharge Plan: [place] Barriers to Discharge: Continued Medical Work up  Expected Discharge Plan and Services Expected Discharge Plan: [place] Choice: Resumption of Svcs/PTA Provider Living arrangements for the past 2 months: Single Family Home Expected Discharge Date: 02/15/22                                     Social Determinants of Health (SDOH) Interventions    Readmission Risk Interventions    02/13/2022    4:57 PM 09/29/2021   11:12 AM  Readmission Risk Prevention Plan  Transportation Screening Complete Complete  PCP or Specialist Appt within 3-5 Days  Complete  HRI or [place]  Complete  Social Work Consult for [place] Planning/Counseling  Complete  Palliative Care Screening  Not Applicable  Medication Review Press photographer) Complete Complete  PCP or Specialist appointment within 3-5 days of discharge Complete   HRI or [place] Complete   SW Recovery Care/Counseling Consult Complete    [name] Not Applicable

## 2022-02-17 LAB — GLUCOSE, CAPILLARY
Glucose-Capillary: 154 mg/dL — ABNORMAL HIGH (ref 70–99)
Glucose-Capillary: 258 mg/dL — ABNORMAL HIGH (ref 70–99)

## 2022-02-17 NOTE — TOC Transition Note (Signed)
Transition of Care [facility]) - CM/SW Discharge Note   Patient Details  Name: [name]. MRN: [id number] Date of Birth: 1955/09/07  Transition of Care [facility]) CM/SW Contact:  Harriet Masson, RN Phone Number:[phone] 02/17/2022, 1:00 PM   Clinical Narrative:    RN spoke with spouse Dondra Spry) today concerning pending DME delivery from [place] [facility]). This DME was confirmed by [employer] as being delivered and EMS was called for transportation. [facility] ([place]) also aware of pt's discharge today. No other needs presented as medical necessity and facesheet printed to the floor for transportation request.  TOC remains available for any other needs.   Final next level of care: Home w Home Health Services Barriers to Discharge: Barriers Resolved   Patient Goals and CMS Choice     Choice offered to / list presented to : NA  Discharge Placement                Patient to be transferred to facility by: [place] Name of family member notified: [name] Lassiter Patient and family notified of of transfer: 02/17/22  Discharge Plan and Services     Post Acute Care Choice: Resumption of Svcs/PTA Provider                        Date [place]: 02/17/22 Time [place]: 1257 Representative spoke with at [place]: [place]) Interventions     Readmission Risk Interventions    02/13/2022    4:57 PM 09/29/2021   11:12 AM  Readmission Risk Prevention Plan  Transportation Screening Complete Complete  PCP or Specialist Appt within 3-5 Days  Complete  HRI or [place]  Complete  Social Work Consult for [place] Planning/Counseling  Complete  Palliative Care Screening  Not Applicable  Medication Review Press photographer) Complete Complete  PCP or Specialist appointment within 3-5 days of discharge Complete   HRI or [place] Complete   SW Recovery Care/Counseling Consult Complete   [place] Not Applicable

## 2022-02-17 NOTE — Plan of Care (Signed)
Pt was seen.    No changes, pt reported doing well, waiting to go home.  Pt was already discharged on 02/15/22 by Dr. Posey Pronto.  Delay in leaving the hospital was due to delay in DME delivery to pt's house.   No charge note.

## 2022-02-19 ENCOUNTER — Ambulatory Visit (INDEPENDENT_AMBULATORY_CARE_PROVIDER_SITE_OTHER): Payer: Medicare Other | Admitting: Nurse Practitioner

## 2022-02-19 ENCOUNTER — Encounter (INDEPENDENT_AMBULATORY_CARE_PROVIDER_SITE_OTHER): Payer: Medicare Other

## 2022-02-21 DIAGNOSIS — Z79899 Other long term (current) drug therapy: Secondary | ICD-10-CM | POA: Diagnosis not present

## 2022-02-21 DIAGNOSIS — I509 Heart failure, unspecified: Secondary | ICD-10-CM | POA: Diagnosis not present

## 2022-02-21 DIAGNOSIS — J811 Chronic pulmonary edema: Secondary | ICD-10-CM | POA: Diagnosis not present

## 2022-02-21 DIAGNOSIS — J90 Pleural effusion, not elsewhere classified: Secondary | ICD-10-CM | POA: Diagnosis not present

## 2022-02-21 DIAGNOSIS — I5031 Acute diastolic (congestive) heart failure: Secondary | ICD-10-CM | POA: Diagnosis not present

## 2022-02-21 DIAGNOSIS — I83009 Varicose veins of unspecified lower extremity with ulcer of unspecified site: Secondary | ICD-10-CM | POA: Diagnosis not present

## 2022-02-24 DIAGNOSIS — Z4781 Encounter for orthopedic aftercare following surgical amputation: Secondary | ICD-10-CM | POA: Diagnosis not present

## 2022-02-24 DIAGNOSIS — M6281 Muscle weakness (generalized): Secondary | ICD-10-CM | POA: Diagnosis not present

## 2022-02-24 DIAGNOSIS — J9601 Acute respiratory failure with hypoxia: Secondary | ICD-10-CM | POA: Diagnosis not present

## 2022-02-26 ENCOUNTER — Encounter (INDEPENDENT_AMBULATORY_CARE_PROVIDER_SITE_OTHER): Payer: Self-pay

## 2022-02-27 ENCOUNTER — Ambulatory Visit (INDEPENDENT_AMBULATORY_CARE_PROVIDER_SITE_OTHER): Payer: Medicare Other | Admitting: Nurse Practitioner

## 2022-02-27 ENCOUNTER — Encounter (INDEPENDENT_AMBULATORY_CARE_PROVIDER_SITE_OTHER): Payer: Medicare Other

## 2022-02-28 DIAGNOSIS — I25710 Atherosclerosis of autologous vein coronary artery bypass graft(s) with unstable angina pectoris: Secondary | ICD-10-CM | POA: Diagnosis not present

## 2022-02-28 DIAGNOSIS — I480 Paroxysmal atrial fibrillation: Secondary | ICD-10-CM | POA: Diagnosis not present

## 2022-02-28 DIAGNOSIS — I2510 Atherosclerotic heart disease of native coronary artery without angina pectoris: Secondary | ICD-10-CM | POA: Diagnosis not present

## 2022-02-28 DIAGNOSIS — I739 Peripheral vascular disease, unspecified: Secondary | ICD-10-CM | POA: Diagnosis not present

## 2022-03-12 DIAGNOSIS — J449 Chronic obstructive pulmonary disease, unspecified: Secondary | ICD-10-CM | POA: Diagnosis not present

## 2022-03-12 DIAGNOSIS — T8189XD Other complications of procedures, not elsewhere classified, subsequent encounter: Secondary | ICD-10-CM | POA: Diagnosis not present

## 2022-03-12 DIAGNOSIS — N401 Enlarged prostate with lower urinary tract symptoms: Secondary | ICD-10-CM | POA: Diagnosis not present

## 2022-03-12 DIAGNOSIS — N39498 Other specified urinary incontinence: Secondary | ICD-10-CM | POA: Diagnosis not present

## 2022-03-12 DIAGNOSIS — I2510 Atherosclerotic heart disease of native coronary artery without angina pectoris: Secondary | ICD-10-CM | POA: Diagnosis not present

## 2022-03-12 DIAGNOSIS — I110 Hypertensive heart disease with heart failure: Secondary | ICD-10-CM | POA: Diagnosis not present

## 2022-03-12 DIAGNOSIS — E039 Hypothyroidism, unspecified: Secondary | ICD-10-CM | POA: Diagnosis not present

## 2022-03-12 DIAGNOSIS — R41841 Cognitive communication deficit: Secondary | ICD-10-CM | POA: Diagnosis not present

## 2022-03-12 DIAGNOSIS — E119 Type 2 diabetes mellitus without complications: Secondary | ICD-10-CM | POA: Diagnosis not present

## 2022-03-12 DIAGNOSIS — G4733 Obstructive sleep apnea (adult) (pediatric): Secondary | ICD-10-CM | POA: Diagnosis not present

## 2022-03-12 DIAGNOSIS — D509 Iron deficiency anemia, unspecified: Secondary | ICD-10-CM | POA: Diagnosis not present

## 2022-03-12 DIAGNOSIS — I480 Paroxysmal atrial fibrillation: Secondary | ICD-10-CM | POA: Diagnosis not present

## 2022-03-12 DIAGNOSIS — I5033 Acute on chronic diastolic (congestive) heart failure: Secondary | ICD-10-CM | POA: Diagnosis not present

## 2022-03-12 DIAGNOSIS — G2581 Restless legs syndrome: Secondary | ICD-10-CM | POA: Diagnosis not present

## 2022-03-12 DIAGNOSIS — F339 Major depressive disorder, recurrent, unspecified: Secondary | ICD-10-CM | POA: Diagnosis not present

## 2022-03-12 DIAGNOSIS — G4710 Hypersomnia, unspecified: Secondary | ICD-10-CM | POA: Diagnosis not present

## 2022-03-13 DIAGNOSIS — I2510 Atherosclerotic heart disease of native coronary artery without angina pectoris: Secondary | ICD-10-CM | POA: Diagnosis not present

## 2022-03-13 DIAGNOSIS — Z79899 Other long term (current) drug therapy: Secondary | ICD-10-CM | POA: Diagnosis not present

## 2022-03-13 DIAGNOSIS — I10 Essential (primary) hypertension: Secondary | ICD-10-CM | POA: Diagnosis not present

## 2022-03-13 DIAGNOSIS — E039 Hypothyroidism, unspecified: Secondary | ICD-10-CM | POA: Diagnosis not present

## 2022-03-14 DIAGNOSIS — R8290 Unspecified abnormal findings in urine: Secondary | ICD-10-CM | POA: Diagnosis not present

## 2022-03-15 ENCOUNTER — Other Ambulatory Visit: Payer: Self-pay

## 2022-03-15 ENCOUNTER — Inpatient Hospital Stay
Admission: EM | Admit: 2022-03-15 | Discharge: 2022-03-23 | DRG: 871 | Disposition: A | Discharge status: Home Health | Payer: Medicare Other | Attending: Internal Medicine | Admitting: Internal Medicine

## 2022-03-15 ENCOUNTER — Emergency Department: Payer: Medicare Other

## 2022-03-15 ENCOUNTER — Inpatient Hospital Stay: Payer: Medicare Other

## 2022-03-15 DIAGNOSIS — A419 Sepsis, unspecified organism: Secondary | ICD-10-CM | POA: Diagnosis not present

## 2022-03-15 DIAGNOSIS — I4892 Unspecified atrial flutter: Secondary | ICD-10-CM | POA: Diagnosis present

## 2022-03-15 DIAGNOSIS — S37001A Unspecified injury of right kidney, initial encounter: Secondary | ICD-10-CM | POA: Diagnosis not present

## 2022-03-15 DIAGNOSIS — R7881 Bacteremia: Secondary | ICD-10-CM | POA: Insufficient documentation

## 2022-03-15 DIAGNOSIS — S37011A Minor contusion of right kidney, initial encounter: Secondary | ICD-10-CM | POA: Diagnosis present

## 2022-03-15 DIAGNOSIS — Z89512 Acquired absence of left leg below knee: Secondary | ICD-10-CM

## 2022-03-15 DIAGNOSIS — R918 Other nonspecific abnormal finding of lung field: Secondary | ICD-10-CM | POA: Diagnosis present

## 2022-03-15 DIAGNOSIS — E1151 Type 2 diabetes mellitus with diabetic peripheral angiopathy without gangrene: Secondary | ICD-10-CM | POA: Diagnosis present

## 2022-03-15 DIAGNOSIS — Z87891 Personal history of nicotine dependence: Secondary | ICD-10-CM

## 2022-03-15 DIAGNOSIS — S37021A Major contusion of right kidney, initial encounter: Secondary | ICD-10-CM | POA: Diagnosis not present

## 2022-03-15 DIAGNOSIS — I700 Atherosclerosis of aorta: Secondary | ICD-10-CM | POA: Diagnosis not present

## 2022-03-15 DIAGNOSIS — I2489 Other forms of acute ischemic heart disease: Secondary | ICD-10-CM | POA: Diagnosis present

## 2022-03-15 DIAGNOSIS — G4733 Obstructive sleep apnea (adult) (pediatric): Secondary | ICD-10-CM | POA: Diagnosis present

## 2022-03-15 DIAGNOSIS — Z7982 Long term (current) use of aspirin: Secondary | ICD-10-CM

## 2022-03-15 DIAGNOSIS — R296 Repeated falls: Secondary | ICD-10-CM | POA: Diagnosis present

## 2022-03-15 DIAGNOSIS — S37022A Major contusion of left kidney, initial encounter: Secondary | ICD-10-CM | POA: Diagnosis not present

## 2022-03-15 DIAGNOSIS — Z6837 Body mass index (BMI) 37.0-37.9, adult: Secondary | ICD-10-CM

## 2022-03-15 DIAGNOSIS — R579 Shock, unspecified: Secondary | ICD-10-CM

## 2022-03-15 DIAGNOSIS — D649 Anemia, unspecified: Secondary | ICD-10-CM | POA: Diagnosis not present

## 2022-03-15 DIAGNOSIS — E785 Hyperlipidemia, unspecified: Secondary | ICD-10-CM | POA: Diagnosis present

## 2022-03-15 DIAGNOSIS — S37019A Minor contusion of unspecified kidney, initial encounter: Secondary | ICD-10-CM | POA: Diagnosis not present

## 2022-03-15 DIAGNOSIS — I771 Stricture of artery: Secondary | ICD-10-CM | POA: Diagnosis not present

## 2022-03-15 DIAGNOSIS — E872 Acidosis, unspecified: Secondary | ICD-10-CM | POA: Diagnosis not present

## 2022-03-15 DIAGNOSIS — E039 Hypothyroidism, unspecified: Secondary | ICD-10-CM | POA: Diagnosis present

## 2022-03-15 DIAGNOSIS — Z9981 Dependence on supplemental oxygen: Secondary | ICD-10-CM

## 2022-03-15 DIAGNOSIS — Z955 Presence of coronary angioplasty implant and graft: Secondary | ICD-10-CM

## 2022-03-15 DIAGNOSIS — G9341 Metabolic encephalopathy: Secondary | ICD-10-CM | POA: Diagnosis not present

## 2022-03-15 DIAGNOSIS — K760 Fatty (change of) liver, not elsewhere classified: Secondary | ICD-10-CM | POA: Diagnosis present

## 2022-03-15 DIAGNOSIS — I5032 Chronic diastolic (congestive) heart failure: Secondary | ICD-10-CM | POA: Diagnosis present

## 2022-03-15 DIAGNOSIS — J90 Pleural effusion, not elsewhere classified: Secondary | ICD-10-CM | POA: Diagnosis not present

## 2022-03-15 DIAGNOSIS — A4159 Other Gram-negative sepsis: Secondary | ICD-10-CM | POA: Diagnosis not present

## 2022-03-15 DIAGNOSIS — W19XXXA Unspecified fall, initial encounter: Secondary | ICD-10-CM | POA: Diagnosis present

## 2022-03-15 DIAGNOSIS — K8020 Calculus of gallbladder without cholecystitis without obstruction: Secondary | ICD-10-CM | POA: Diagnosis not present

## 2022-03-15 DIAGNOSIS — Z79890 Hormone replacement therapy (postmenopausal): Secondary | ICD-10-CM

## 2022-03-15 DIAGNOSIS — Z880 Allergy status to penicillin: Secondary | ICD-10-CM

## 2022-03-15 DIAGNOSIS — R6521 Severe sepsis with septic shock: Secondary | ICD-10-CM | POA: Diagnosis present

## 2022-03-15 DIAGNOSIS — I2510 Atherosclerotic heart disease of native coronary artery without angina pectoris: Secondary | ICD-10-CM | POA: Diagnosis present

## 2022-03-15 DIAGNOSIS — J9621 Acute and chronic respiratory failure with hypoxia: Secondary | ICD-10-CM | POA: Diagnosis present

## 2022-03-15 DIAGNOSIS — Z7901 Long term (current) use of anticoagulants: Secondary | ICD-10-CM

## 2022-03-15 DIAGNOSIS — R739 Hyperglycemia, unspecified: Secondary | ICD-10-CM

## 2022-03-15 DIAGNOSIS — J9811 Atelectasis: Secondary | ICD-10-CM | POA: Diagnosis present

## 2022-03-15 DIAGNOSIS — E1100 Type 2 diabetes mellitus with hyperosmolarity without nonketotic hyperglycemic-hyperosmolar coma (NKHHC): Secondary | ICD-10-CM | POA: Diagnosis not present

## 2022-03-15 DIAGNOSIS — Z8249 Family history of ischemic heart disease and other diseases of the circulatory system: Secondary | ICD-10-CM

## 2022-03-15 DIAGNOSIS — J811 Chronic pulmonary edema: Secondary | ICD-10-CM | POA: Diagnosis not present

## 2022-03-15 DIAGNOSIS — Z9889 Other specified postprocedural states: Secondary | ICD-10-CM

## 2022-03-15 DIAGNOSIS — I4510 Unspecified right bundle-branch block: Secondary | ICD-10-CM | POA: Diagnosis present

## 2022-03-15 DIAGNOSIS — I252 Old myocardial infarction: Secondary | ICD-10-CM

## 2022-03-15 DIAGNOSIS — J449 Chronic obstructive pulmonary disease, unspecified: Secondary | ICD-10-CM | POA: Diagnosis present

## 2022-03-15 DIAGNOSIS — N390 Urinary tract infection, site not specified: Secondary | ICD-10-CM | POA: Diagnosis not present

## 2022-03-15 DIAGNOSIS — I10 Essential (primary) hypertension: Secondary | ICD-10-CM | POA: Diagnosis not present

## 2022-03-15 DIAGNOSIS — Z7984 Long term (current) use of oral hypoglycemic drugs: Secondary | ICD-10-CM

## 2022-03-15 DIAGNOSIS — I517 Cardiomegaly: Secondary | ICD-10-CM | POA: Diagnosis not present

## 2022-03-15 DIAGNOSIS — N12 Tubulo-interstitial nephritis, not specified as acute or chronic: Secondary | ICD-10-CM | POA: Diagnosis present

## 2022-03-15 DIAGNOSIS — Y92009 Unspecified place in unspecified non-institutional (private) residence as the place of occurrence of the external cause: Secondary | ICD-10-CM | POA: Diagnosis not present

## 2022-03-15 DIAGNOSIS — I110 Hypertensive heart disease with heart failure: Secondary | ICD-10-CM | POA: Diagnosis not present

## 2022-03-15 DIAGNOSIS — Z452 Encounter for adjustment and management of vascular access device: Secondary | ICD-10-CM | POA: Diagnosis not present

## 2022-03-15 DIAGNOSIS — B961 Klebsiella pneumoniae [K. pneumoniae] as the cause of diseases classified elsewhere: Secondary | ICD-10-CM | POA: Insufficient documentation

## 2022-03-15 DIAGNOSIS — E1165 Type 2 diabetes mellitus with hyperglycemia: Secondary | ICD-10-CM | POA: Diagnosis present

## 2022-03-15 DIAGNOSIS — N179 Acute kidney failure, unspecified: Secondary | ICD-10-CM | POA: Diagnosis present

## 2022-03-15 DIAGNOSIS — Z89421 Acquired absence of other right toe(s): Secondary | ICD-10-CM

## 2022-03-15 DIAGNOSIS — Z1152 Encounter for screening for COVID-19: Secondary | ICD-10-CM

## 2022-03-15 DIAGNOSIS — K7689 Other specified diseases of liver: Secondary | ICD-10-CM | POA: Diagnosis not present

## 2022-03-15 DIAGNOSIS — Z794 Long term (current) use of insulin: Secondary | ICD-10-CM

## 2022-03-15 DIAGNOSIS — M109 Gout, unspecified: Secondary | ICD-10-CM | POA: Diagnosis present

## 2022-03-15 DIAGNOSIS — E871 Hypo-osmolality and hyponatremia: Secondary | ICD-10-CM | POA: Diagnosis present

## 2022-03-15 DIAGNOSIS — Z888 Allergy status to other drugs, medicaments and biological substances status: Secondary | ICD-10-CM

## 2022-03-15 DIAGNOSIS — I480 Paroxysmal atrial fibrillation: Secondary | ICD-10-CM | POA: Diagnosis present

## 2022-03-15 DIAGNOSIS — Z79899 Other long term (current) drug therapy: Secondary | ICD-10-CM

## 2022-03-15 DIAGNOSIS — N400 Enlarged prostate without lower urinary tract symptoms: Secondary | ICD-10-CM | POA: Diagnosis present

## 2022-03-15 DIAGNOSIS — Z951 Presence of aortocoronary bypass graft: Secondary | ICD-10-CM

## 2022-03-15 DIAGNOSIS — Z7951 Long term (current) use of inhaled steroids: Secondary | ICD-10-CM

## 2022-03-15 LAB — CBC WITH DIFFERENTIAL/PLATELET
Abs Immature Granulocytes: 0.05 10*3/uL (ref 0.00–0.07)
Basophils Absolute: 0.1 10*3/uL (ref 0.0–0.1)
Basophils Relative: 1 %
Eosinophils Absolute: 0 10*3/uL (ref 0.0–0.5)
Eosinophils Relative: 0 %
HCT: 27.1 % — ABNORMAL LOW (ref 39.0–52.0)
Hemoglobin: 8.8 g/dL — ABNORMAL LOW (ref 13.0–17.0)
Immature Granulocytes: 0 %
Lymphocytes Relative: 7 %
Lymphs Abs: 0.8 10*3/uL (ref 0.7–4.0)
MCH: 29.9 pg (ref 26.0–34.0)
MCHC: 32.5 g/dL (ref 30.0–36.0)
MCV: 92.2 fL (ref 80.0–100.0)
Monocytes Absolute: 0.7 10*3/uL (ref 0.1–1.0)
Monocytes Relative: 6 %
Neutro Abs: 9.8 10*3/uL — ABNORMAL HIGH (ref 1.7–7.7)
Neutrophils Relative %: 86 %
Platelets: 124 10*3/uL — ABNORMAL LOW (ref 150–400)
RBC: 2.94 MIL/uL — ABNORMAL LOW (ref 4.22–5.81)
RDW: 15.4 % (ref 11.5–15.5)
WBC: 11.4 10*3/uL — ABNORMAL HIGH (ref 4.0–10.5)
nRBC: 0 % (ref 0.0–0.2)

## 2022-03-15 LAB — URINALYSIS, COMPLETE (UACMP) WITH MICROSCOPIC
Bilirubin Urine: NEGATIVE
Glucose, UA: >=500 mg/dL — AB
Ketones, ur: 5 mg/dL — AB
Nitrite: NEGATIVE
Protein, ur: >=300 mg/dL — AB
Specific Gravity, Urine: 1.017 (ref 1.005–1.030)
Squamous Epithelial / HPF: NONE SEEN (ref 0–5)
WBC, UA: >50 WBC/hpf — ABNORMAL HIGH (ref 0–5)
pH: 5 (ref 5.0–8.0)

## 2022-03-15 LAB — BASIC METABOLIC PANEL
Anion gap: 11 (ref 5–15)
BUN: 50 mg/dL — ABNORMAL HIGH (ref 8–23)
CO2: 24 mmol/L (ref 22–32)
Calcium: 8 mg/dL — ABNORMAL LOW (ref 8.9–10.3)
Chloride: 92 mmol/L — ABNORMAL LOW (ref 98–111)
Creatinine, Ser: 2.23 mg/dL — ABNORMAL HIGH (ref 0.61–1.24)
GFR, Estimated: 32 mL/min — ABNORMAL LOW (ref >60)
Glucose, Bld: 485 mg/dL — ABNORMAL HIGH (ref 70–99)
Potassium: 3.7 mmol/L (ref 3.5–5.1)
Sodium: 127 mmol/L — ABNORMAL LOW (ref 135–145)

## 2022-03-15 LAB — MRSA NEXT GEN BY PCR, NASAL: MRSA by PCR Next Gen: DETECTED — AB

## 2022-03-15 LAB — BLOOD GAS, ARTERIAL
Acid-Base Excess: 1 mmol/L (ref 0.0–2.0)
Bicarbonate: 26 mmol/L (ref 20.0–28.0)
O2 Content: 3.5 L/min
O2 Saturation: 98.6 %
Patient temperature: 37
pCO2 arterial: 42 mmHg (ref 32–48)
pH, Arterial: 7.4 (ref 7.35–7.45)
pO2, Arterial: 97 mmHg (ref 83–108)

## 2022-03-15 LAB — RESP PANEL BY RT-PCR (FLU A&B, COVID) ARPGX2
Influenza A by PCR: NEGATIVE
Influenza B by PCR: NEGATIVE
SARS Coronavirus 2 by RT PCR: NEGATIVE

## 2022-03-15 LAB — FERRITIN: Ferritin: 194 ng/mL (ref 24–336)

## 2022-03-15 LAB — COMPREHENSIVE METABOLIC PANEL
ALT: 19 U/L (ref 0–44)
AST: 26 U/L (ref 15–41)
Albumin: 3.2 g/dL — ABNORMAL LOW (ref 3.5–5.0)
Alkaline Phosphatase: 85 U/L (ref 38–126)
Anion gap: 14 (ref 5–15)
BUN: 50 mg/dL — ABNORMAL HIGH (ref 8–23)
CO2: 21 mmol/L — ABNORMAL LOW (ref 22–32)
Calcium: 8.5 mg/dL — ABNORMAL LOW (ref 8.9–10.3)
Chloride: 90 mmol/L — ABNORMAL LOW (ref 98–111)
Creatinine, Ser: 2.34 mg/dL — ABNORMAL HIGH (ref 0.61–1.24)
GFR, Estimated: 30 mL/min — ABNORMAL LOW (ref >60)
Glucose, Bld: 604 mg/dL (ref 70–99)
Potassium: 4 mmol/L (ref 3.5–5.1)
Sodium: 125 mmol/L — ABNORMAL LOW (ref 135–145)
Total Bilirubin: 2.1 mg/dL — ABNORMAL HIGH (ref 0.3–1.2)
Total Protein: 6.7 g/dL (ref 6.5–8.1)

## 2022-03-15 LAB — CBG MONITORING, ED
Glucose-Capillary: 579 mg/dL (ref 70–99)
Glucose-Capillary: 582 mg/dL (ref 70–99)
Glucose-Capillary: 444 mg/dL — ABNORMAL HIGH (ref 70–99)

## 2022-03-15 LAB — IRON AND TIBC
Iron: 15 ug/dL — ABNORMAL LOW (ref 45–182)
Saturation Ratios: 8 % — ABNORMAL LOW (ref 17.9–39.5)
TIBC: 185 ug/dL — ABNORMAL LOW (ref 250–450)
UIBC: 170 ug/dL

## 2022-03-15 LAB — PROTIME-INR
INR: 1.6 — ABNORMAL HIGH (ref 0.8–1.2)
Prothrombin Time: 18.5 seconds — ABNORMAL HIGH (ref 11.4–15.2)

## 2022-03-15 LAB — TROPONIN I (HIGH SENSITIVITY)
Troponin I (High Sensitivity): 57 ng/L — ABNORMAL HIGH (ref <18)
Troponin I (High Sensitivity): 46 ng/L — ABNORMAL HIGH (ref <18)

## 2022-03-15 LAB — LACTIC ACID, PLASMA
Lactic Acid, Venous: 2.2 mmol/L (ref 0.5–1.9)
Lactic Acid, Venous: 1.9 mmol/L (ref 0.5–1.9)
Lactic Acid, Venous: 2.1 mmol/L (ref 0.5–1.9)
Lactic Acid, Venous: 1.7 mmol/L (ref 0.5–1.9)

## 2022-03-15 LAB — PHOSPHORUS: Phosphorus: 3.9 mg/dL (ref 2.5–4.6)

## 2022-03-15 LAB — OSMOLALITY: Osmolality: 301 mOsm/kg — ABNORMAL HIGH (ref 275–295)

## 2022-03-15 LAB — APTT: aPTT: 37 seconds — ABNORMAL HIGH (ref 24–36)

## 2022-03-15 LAB — CK: Total CK: 212 U/L (ref 49–397)

## 2022-03-15 LAB — MAGNESIUM: Magnesium: 1.9 mg/dL (ref 1.7–2.4)

## 2022-03-15 LAB — FOLATE: Folate: 15.7 ng/mL (ref >5.9)

## 2022-03-15 LAB — GLUCOSE, CAPILLARY
Glucose-Capillary: 376 mg/dL — ABNORMAL HIGH (ref 70–99)
Glucose-Capillary: 450 mg/dL — ABNORMAL HIGH (ref 70–99)
Glucose-Capillary: 474 mg/dL — ABNORMAL HIGH (ref 70–99)

## 2022-03-15 MED ORDER — DEXTROSE IN LACTATED RINGERS 5 % IV SOLN: INTRAVENOUS | Status: DC

## 2022-03-15 MED ORDER — VITAMIN D 25 MCG (1000 UNIT) PO TABS
1000.0000 [IU] | ORAL_TABLET | Freq: Every day | ORAL | Status: DC
  Administered 2022-03-17 – 2022-03-23 (×7): 1000 [IU] via ORAL
  Filled 2022-03-15 (×7): qty 1

## 2022-03-15 MED ORDER — VITAMIN B-12 1000 MCG PO TABS
1000.0000 ug | ORAL_TABLET | Freq: Every day | ORAL | Status: DC
  Administered 2022-03-17 – 2022-03-23 (×7): 1000 ug via ORAL
  Filled 2022-03-15 (×7): qty 1

## 2022-03-15 MED ORDER — LEVOTHYROXINE SODIUM 25 MCG PO TABS
25.0000 ug | ORAL_TABLET | Freq: Every day | ORAL | Status: DC
  Administered 2022-03-18 – 2022-03-23 (×6): 25 ug via ORAL
  Filled 2022-03-15 (×6): qty 1

## 2022-03-15 MED ORDER — RISPERIDONE 0.5 MG PO TABS
0.5000 mg | ORAL_TABLET | Freq: Two times a day (BID) | ORAL | Status: DC
  Filled 2022-03-15: qty 1

## 2022-03-15 MED ORDER — PRIMIDONE 250 MG PO TABS
250.0000 mg | ORAL_TABLET | Freq: Two times a day (BID) | ORAL | Status: DC
  Administered 2022-03-17 – 2022-03-23 (×13): 250 mg via ORAL
  Filled 2022-03-15 (×17): qty 1

## 2022-03-15 MED ORDER — AMIODARONE HCL 200 MG PO TABS
200.0000 mg | ORAL_TABLET | Freq: Every day | ORAL | Status: DC
  Administered 2022-03-17 – 2022-03-23 (×7): 200 mg via ORAL
  Filled 2022-03-15 (×7): qty 1

## 2022-03-15 MED ORDER — POTASSIUM CHLORIDE 10 MEQ/100ML IV SOLN
10.0000 meq | INTRAVENOUS | Status: AC
  Administered 2022-03-15 (×2): 10 meq via INTRAVENOUS
  Filled 2022-03-15 (×2): qty 100

## 2022-03-15 MED ORDER — CHLORHEXIDINE GLUCONATE CLOTH 2 % EX PADS
6.0000 | MEDICATED_PAD | Freq: Every day | CUTANEOUS | Status: DC
  Administered 2022-03-15 – 2022-03-23 (×7): 6 via TOPICAL

## 2022-03-15 MED ORDER — LACTATED RINGERS IV BOLUS (SEPSIS)
1000.0000 mL | Freq: Once | INTRAVENOUS | Status: AC
  Administered 2022-03-15: 1000 mL via INTRAVENOUS

## 2022-03-15 MED ORDER — FERROUS SULFATE 325 (65 FE) MG PO TABS
325.0000 mg | ORAL_TABLET | Freq: Every day | ORAL | Status: DC
  Administered 2022-03-17 – 2022-03-23 (×7): 325 mg via ORAL
  Filled 2022-03-15 (×7): qty 1

## 2022-03-15 MED ORDER — FINASTERIDE 5 MG PO TABS
5.0000 mg | ORAL_TABLET | Freq: Every day | ORAL | Status: DC
  Administered 2022-03-17 – 2022-03-23 (×7): 5 mg via ORAL
  Filled 2022-03-15 (×8): qty 1

## 2022-03-15 MED ORDER — VITAMIN C 500 MG PO TABS
500.0000 mg | ORAL_TABLET | Freq: Every day | ORAL | Status: DC
  Administered 2022-03-17 – 2022-03-23 (×7): 500 mg via ORAL
  Filled 2022-03-15 (×7): qty 1

## 2022-03-15 MED ORDER — BUDESONIDE 0.5 MG/2ML IN SUSP
0.5000 mg | Freq: Two times a day (BID) | RESPIRATORY_TRACT | Status: DC
  Administered 2022-03-16 – 2022-03-23 (×12): 0.5 mg via RESPIRATORY_TRACT
  Filled 2022-03-15 (×16): qty 2

## 2022-03-15 MED ORDER — INSULIN REGULAR(HUMAN) IN NACL 100-0.9 UT/100ML-% IV SOLN
INTRAVENOUS | Status: DC
  Administered 2022-03-15: 11.5 [IU]/h via INTRAVENOUS
  Filled 2022-03-15: qty 100

## 2022-03-15 MED ORDER — NOREPINEPHRINE 4 MG/250ML-% IV SOLN
2.0000 ug/min | INTRAVENOUS | Status: DC
  Administered 2022-03-16: 12 ug/min via INTRAVENOUS
  Filled 2022-03-15: qty 250

## 2022-03-15 MED ORDER — SODIUM CHLORIDE 0.9 % IV SOLN: 1.0000 g | Freq: Three times a day (TID) | INTRAVENOUS | Status: DC

## 2022-03-15 MED ORDER — SODIUM CHLORIDE 0.9 % IV BOLUS: 1000.0000 mL | Freq: Once | INTRAVENOUS | Status: DC

## 2022-03-15 MED ORDER — SODIUM CHLORIDE 0.9 % IV SOLN: INTRAVENOUS | Status: DC

## 2022-03-15 MED ORDER — VANCOMYCIN HCL IN DEXTROSE 1-5 GM/200ML-% IV SOLN: 1000.0000 mg | Freq: Once | INTRAVENOUS | Status: DC

## 2022-03-15 MED ORDER — SODIUM CHLORIDE 0.9 % IV BOLUS
500.0000 mL | Freq: Once | INTRAVENOUS | Status: AC
  Administered 2022-03-15: 500 mL via INTRAVENOUS

## 2022-03-15 MED ORDER — VANCOMYCIN HCL 1500 MG/300ML IV SOLN
1500.0000 mg | Freq: Once | INTRAVENOUS | Status: AC
  Administered 2022-03-15: 1500 mg via INTRAVENOUS
  Filled 2022-03-15: qty 300

## 2022-03-15 MED ORDER — TRAMADOL HCL 50 MG PO TABS
50.0000 mg | ORAL_TABLET | Freq: Every day | ORAL | Status: DC | PRN
  Administered 2022-03-18 – 2022-03-20 (×3): 50 mg via ORAL
  Filled 2022-03-15 (×3): qty 1

## 2022-03-15 MED ORDER — FUROSEMIDE 20 MG PO TABS: 40.0000 mg | ORAL_TABLET | Freq: Every day | ORAL | Status: DC

## 2022-03-15 MED ORDER — POTASSIUM CHLORIDE 10 MEQ/100ML IV SOLN: 10.0000 meq | INTRAVENOUS | Status: AC

## 2022-03-15 MED ORDER — SODIUM CHLORIDE 0.9 % IV SOLN
2.0000 g | Freq: Two times a day (BID) | INTRAVENOUS | Status: DC
  Administered 2022-03-16: 2 g via INTRAVENOUS
  Filled 2022-03-15: qty 12.5

## 2022-03-15 MED ORDER — NOREPINEPHRINE 4 MG/250ML-% IV SOLN
INTRAVENOUS | Status: AC
  Administered 2022-03-15: 10 ug/min via INTRAVENOUS
  Filled 2022-03-15: qty 250

## 2022-03-15 MED ORDER — APIXABAN 5 MG PO TABS: 5.0000 mg | ORAL_TABLET | Freq: Two times a day (BID) | ORAL | Status: DC

## 2022-03-15 MED ORDER — VANCOMYCIN VARIABLE DOSE PER UNSTABLE RENAL FUNCTION (PHARMACIST DOSING)
Status: DC
  Filled 2022-03-15: qty 1

## 2022-03-15 MED ORDER — ADULT MULTIVITAMIN W/MINERALS CH
1.0000 | ORAL_TABLET | Freq: Every day | ORAL | Status: DC
  Administered 2022-03-17 – 2022-03-23 (×6): 1 via ORAL
  Filled 2022-03-15 (×7): qty 1

## 2022-03-15 MED ORDER — DEXTROSE 50 % IV SOLN: 0.0000 mL | INTRAVENOUS | Status: DC | PRN

## 2022-03-15 MED ORDER — INSULIN REGULAR(HUMAN) IN NACL 100-0.9 UT/100ML-% IV SOLN
INTRAVENOUS | Status: DC
  Administered 2022-03-15: 11.5 [IU]/h via INTRAVENOUS
  Administered 2022-03-16: 6 [IU]/h via INTRAVENOUS
  Filled 2022-03-15: qty 100

## 2022-03-15 MED ORDER — SODIUM CHLORIDE 0.9 % IV SOLN
2.0000 g | Freq: Once | INTRAVENOUS | Status: AC
  Administered 2022-03-15: 2 g via INTRAVENOUS
  Filled 2022-03-15: qty 12.5

## 2022-03-15 MED ORDER — LACTATED RINGERS IV SOLN: INTRAVENOUS | Status: DC

## 2022-03-15 MED ORDER — ASPIRIN 81 MG PO TBEC: 81.0000 mg | DELAYED_RELEASE_TABLET | Freq: Every day | ORAL | Status: DC

## 2022-03-15 MED ORDER — DILTIAZEM HCL ER 60 MG PO CP12
120.0000 mg | ORAL_CAPSULE | Freq: Two times a day (BID) | ORAL | Status: DC
  Filled 2022-03-15 (×2): qty 2

## 2022-03-15 MED ORDER — TAMSULOSIN HCL 0.4 MG PO CAPS
0.4000 mg | ORAL_CAPSULE | Freq: Every day | ORAL | Status: DC
  Administered 2022-03-17 – 2022-03-23 (×7): 0.4 mg via ORAL
  Filled 2022-03-15 (×7): qty 1

## 2022-03-15 MED ORDER — VANCOMYCIN HCL IN DEXTROSE 1-5 GM/200ML-% IV SOLN
1000.0000 mg | Freq: Once | INTRAVENOUS | Status: AC
  Administered 2022-03-15: 1000 mg via INTRAVENOUS
  Filled 2022-03-15: qty 200

## 2022-03-15 MED ORDER — EZETIMIBE 10 MG PO TABS
10.0000 mg | ORAL_TABLET | Freq: Every day | ORAL | Status: DC
  Administered 2022-03-17 – 2022-03-23 (×7): 10 mg via ORAL
  Filled 2022-03-15 (×8): qty 1

## 2022-03-15 MED ORDER — MUPIROCIN 2 % EX OINT
1.0000 | TOPICAL_OINTMENT | Freq: Two times a day (BID) | CUTANEOUS | Status: AC
  Administered 2022-03-16 – 2022-03-20 (×10): 1 via NASAL
  Filled 2022-03-15: qty 22

## 2022-03-15 MED ORDER — SODIUM CHLORIDE 0.9 % IV SOLN
250.0000 mL | INTRAVENOUS | Status: DC
  Administered 2022-03-22: 250 mL via INTRAVENOUS

## 2022-03-15 MED ORDER — LACTATED RINGERS IV BOLUS (SEPSIS)
800.0000 mL | Freq: Once | INTRAVENOUS | Status: AC
  Administered 2022-03-15: 800 mL via INTRAVENOUS

## 2022-03-15 NOTE — ED Notes (Signed)
Report called to RN taking pt into ICU Room 7.

## 2022-03-15 NOTE — H&P (Addendum)
History and Physical   [facility] - [place] @ [facility] Admission History and Physical [employer], D.O.    Patient Name: [name] MR#: [id number] Date of Birth: Jul 17, 1955 Date of Admission: 03/15/2022  Referring MD/NP/PA: Dr. Cherylann Banas Primary Care Physician: [name], MD  Chief Complaint:  Chief Complaint  Patient presents with   Hyperglycemia  Please note the entire history is obtained from the patient's emergency department chart, emergency department provider Patient's personal history is limited by somnolence.    HPI: [name] is a 66 y.o. male with a known history of atrial fibrillation on Eliquis, CHF, COPD, CAD, diabetes, hypertension, hyperlipidemia presents to the emergency department for evaluation of confusion and poor p.o. intake.  Patient was in a usual state of health until yesterday patient's wife stated that he was last normal yesterday and becoming increasingly confused.  He had a blood glucose in the 400s for which his wife gave 27 units of insulin.  Was also found to be hypotensive by his physical therapist.  He is somnolent on my exam and is unable to provide history.   Of note patient was admitted to the hospital with generalized weakness, CHF and atrial fibrillation in October 2023.   EMS/ED Course: Patient received cefepime, Vanco, lactated Ringer's, insulin via Endo tool. Medical admission has been requested for further management of sepsis secondary to urinary tract infection, HHS.  Review of Systems:  Unable to obtain 2/2 somnolence   Past Medical History:  Diagnosis Date   Arrhythmia    atrial fibrillation   Asthma    CHF (congestive heart failure) (HCC)    COPD (chronic obstructive pulmonary disease) (HCC)    Coronary artery disease    Depression    Diabetes mellitus without complication (HCC)    Gout    History anabolic steroid use    Hyperlipidemia    Hypertension    Hypogonadism in male    Morbid  obesity ([place])    Myocardial infarction ([place])    Peripheral vascular disease ([place])    Perirectal abscess    Pleurisy    Sleep apnea    CPAP at night, no oxygen   Varicella     Past Surgical History:  Procedure Laterality Date   ABDOMINAL AORTIC ANEURYSM REPAIR     ACHILLES TENDON SURGERY Left 01/10/2021   Procedure: ACHILLES LENGTHENING/KIDNER;  Surgeon: Caroline More, DPM;  Location: [facility];  Service: Podiatry;  Laterality: Left;   AMPUTATION Left 10/03/2021   Procedure: AMPUTATION BELOW KNEE;  Surgeon: Katha Cabal, MD;  Location: [facility];  Service: Vascular;  Laterality: Left;   AMPUTATION TOE Right 02/10/2016   Procedure: AMPUTATION TOE 3RD TOE;  Surgeon: Samara Deist, DPM;  Location: [facility];  Service: Podiatry;  Laterality: Right;   AMPUTATION TOE Left 02/24/2020   Procedure: AMPUTATION TOE;  Surgeon: Caroline More, DPM;  Location: [facility];  Service: Podiatry;  Laterality: Left;   APPLICATION OF WOUND VAC Left 02/29/2020   Procedure: APPLICATION OF WOUND VAC;  Surgeon: Caroline More, DPM;  Location: [facility];  Service: Podiatry;  Laterality: Left;   CARDIOVERSION [name] 02/13/2022   Procedure: CARDIOVERSION;  Surgeon: Corey Skains, MD;  Location: [facility];  Service: Cardiovascular;  Laterality: [name];   COLONOSCOPY WITH PROPOFOL [name] 11/18/2015   Procedure: COLONOSCOPY WITH PROPOFOL;  Surgeon: Manya Silvas, MD;  Location: [facility];  Service: Endoscopy;  Laterality: [name];   CORONARY ARTERY BYPASS GRAFT     CORONARY STENT INTERVENTION [name] 02/02/2020  Procedure: CORONARY STENT INTERVENTION;  Surgeon: Isaias Cowman, MD;  Location: [place] [facility];  Service: Cardiovascular;  Laterality: [name];   INCISION AND DRAINAGE Left 08/07/2021   Procedure: INCISION AND DRAINAGE-Partial Calcanectomy;  Surgeon: Caroline More, DPM;  Location: [facility];  Service: Podiatry;  Laterality: Left;   IRRIGATION AND DEBRIDEMENT FOOT Left 02/29/2020   Procedure: IRRIGATION AND  DEBRIDEMENT FOOT;  Surgeon: Caroline More, DPM;  Location: [facility];  Service: Podiatry;  Laterality: Left;   IRRIGATION AND DEBRIDEMENT FOOT Left 02/24/2020   Procedure: IRRIGATION AND DEBRIDEMENT FOOT;  Surgeon: Caroline More, DPM;  Location: [facility];  Service: Podiatry;  Laterality: Left;   KNEE ARTHROSCOPY     LEFT HEART CATH AND CORS/GRAFTS ANGIOGRAPHY [name] 02/02/2020   Procedure: LEFT HEART CATH AND CORS/GRAFTS ANGIOGRAPHY;  Surgeon: Teodoro Spray, MD;  Location: [place] [facility];  Service: Cardiovascular;  Laterality: [name];   LOWER EXTREMITY ANGIOGRAPHY Left 02/25/2020   Procedure: Lower Extremity Angiography;  Surgeon: Algernon Huxley, MD;  Location: [place] [facility];  Service: Cardiovascular;  Laterality: Left;   LOWER EXTREMITY ANGIOGRAPHY Left 01/04/2021   Procedure: LOWER EXTREMITY ANGIOGRAPHY;  Surgeon: Algernon Huxley, MD;  Location: [place] [facility];  Service: Cardiovascular;  Laterality: Left;   METATARSAL HEAD EXCISION Left 01/10/2021   Procedure: METATARSAL HEAD EXCISION - LEFT 5th;  Surgeon: Caroline More, DPM;  Location: [facility];  Service: Podiatry;  Laterality: Left;   PERIPHERAL VASCULAR CATHETERIZATION Right 01/24/2016   Procedure: Lower Extremity Angiography;  Surgeon: Katha Cabal, MD;  Location: [place] [facility];  Service: Cardiovascular;  Laterality: Right;   PERIPHERAL VASCULAR CATHETERIZATION Right 01/25/2016   Procedure: Lower Extremity Angiography;  Surgeon: Katha Cabal, MD;  Location: [place] [facility];  Service: Cardiovascular;  Laterality: Right;   TOE AMPUTATION     TONSILLECTOMY       reports that he quit smoking about 6 years ago. His smoking use included cigarettes. He has a 22.50 pack-year smoking history. He has never used smokeless tobacco. He reports that he does not currently use alcohol after a past usage of about 3.0 standard drinks of alcohol per week. He reports that he does not use drugs.  Allergies  Allergen Reactions    Penicillins Other (See Comments)    Happened at 66 years old and pt. stated he passed out  He has tolerated amoxicillin/clavulanate and ampicillin/sulbactam   Statins     Other reaction(s): Muscle Pain Causes legs to ache per pt   Metformin And Related Diarrhea    Family History  Problem Relation Age of Onset   Hypertension Father    Coronary artery disease Father    Alcohol abuse Father    Heart failure Brother     Prior to Admission medications   Medication Sig Start Date End Date Taking? Authorizing Provider  amiodarone (PACERONE) 200 MG tablet Take 200 mg by mouth daily.   Yes [provider]  apixaban (ELIQUIS) 5 MG TABS tablet Take 1 tablet (5 mg total) by mouth 2 (two) times daily. 01/26/16  Yes Stegmayer, Janalyn Harder, PA-C  aspirin EC 81 MG EC tablet Take 1 tablet (81 mg total) by mouth daily. Swallow whole. 05/17/21  Yes Dessa Phi, DO  budesonide (PULMICORT) 0.5 MG/2ML nebulizer solution Take 2 mLs (0.5 mg total) by nebulization 2 (two) times daily. 11/10/21  Yes Emeterio Reeve, DO  Cholecalciferol 25 MCG (1000 UT) tablet Take 1,000 Units by mouth daily.   Yes [provider]  Cyanocobalamin (VITAMIN B-12) 5000 MCG TBDP Take 10,000 mcg by mouth daily.   Yes [provider]  diltiazem (CARDIZEM SR) 120 MG 12 hr capsule Take 1 capsule (120 mg total) by mouth every 12 (twelve) hours. 11/10/21  Yes Emeterio Reeve, DO  ezetimibe (ZETIA) 10 MG tablet Take 10 mg by mouth at bedtime.   Yes [provider]  ferrous sulfate 325 (65 FE) MG tablet Take 325 mg by mouth daily with breakfast.   Yes [provider]  finasteride (PROSCAR) 5 MG tablet Take 1 tablet (5 mg total) by mouth daily. 11/11/21  Yes Emeterio Reeve, DO  furosemide (LASIX) 20 MG tablet Take 2 tablets (40 mg total) by mouth daily. 02/15/22  Yes Fritzi Mandes, MD  HUMALOG 100 UNIT/ML injection Inject 15 Units into the skin 3 (three) times daily with meals. >200 02/05/22   Yes [provider]  insulin glargine-yfgn (SEMGLEE) 100 UNIT/ML injection Inject 0.25 mLs (25 Units total) into the skin at bedtime. Patient taking differently: Inject 0-25 Units into the skin at bedtime. 11/10/21  Yes Emeterio Reeve, DO  levothyroxine (SYNTHROID) 25 MCG tablet Take 1 tablet (25 mcg total) by mouth daily at 6 (six) AM. 11/11/21  Yes Emeterio Reeve, DO  Multiple Vitamins-Minerals (MULTIVITAMIN WITH MINERALS) tablet Take 1 tablet by mouth daily.   Yes [provider]  nitrofurantoin, macrocrystal-monohydrate, (MACROBID) 100 MG capsule Take 100 mg by mouth daily. 03/13/22 06/11/22 Yes [provider]  nystatin (MYCOSTATIN/NYSTOP) powder Apply topically 2 (two) times daily. 11/10/21  Yes Emeterio Reeve, DO  primidone (MYSOLINE) 250 MG tablet Take 250 mg by mouth 2 (two) times daily. 01/23/20  Yes [provider]  risperiDONE (RISPERDAL) 0.5 MG tablet Take 1 tablet (0.5 mg total) by mouth 2 (two) times daily. 11/10/21  Yes Emeterio Reeve, DO  tamsulosin (FLOMAX) 0.4 MG CAPS capsule Take 0.4 mg by mouth daily.   Yes [provider]  vitamin C (ASCORBIC ACID) 500 MG tablet Take 500 mg by mouth daily.   Yes [provider]  acetaminophen (TYLENOL) 325 MG tablet Take 2 tablets (650 mg total) by mouth every 6 (six) hours as needed for mild pain (or Fever >/= 101). 11/10/21   Emeterio Reeve, DO  insulin aspart (NOVOLOG) 100 UNIT/ML injection Inject 0-20 Units into the skin 3 (three) times daily with meals. Patient not taking: Reported on 03/15/2022 11/10/21   Emeterio Reeve, DO  metFORMIN (GLUCOPHAGE) 1000 MG tablet Take 1,000 mg by mouth 2 (two) times daily.  Patient not taking: Reported on 03/15/2022 12/02/17   [provider]  traMADol (ULTRAM) 50 MG tablet Take 1 tablet (50 mg total) by mouth daily as needed for moderate pain. 08/11/21   Fritzi Mandes, MD    Physical Exam: Vitals:   03/15/22 1730 03/15/22 1800  03/15/22 1830 03/15/22 1900  BP: (!) 96/53 (!) 100/45 112/69 (!) 113/58  Pulse: (!) 103 (!) 102 (!) 104 (!) 104  Resp: (!) '21 19 20 16  '$ Temp:    98 F (36.7 C)  TempSrc:    Oral  SpO2: 100% 100% 100% 100%  Weight:      Height:        GENERAL: 66 y.o.-year-old white male patient, well-developed, well-nourished lying in the bed in no acute distress.  Somnolent, difficult to arouse HEENT: Head atraumatic, normocephalic. Pupils equal. Mucus membranes dry NECK: Supple. No JVD. CHEST: Normal breath sounds bilaterally. No wheezing, rales, rhonchi or crackles. No use of accessory  muscles of respiration. CARDIOVASCULAR: Irregular.  S1, S2 normal. No murmurs, rubs, or gallops. Cap refill <2 seconds. Pulses intact distally.  ABDOMEN: Soft, nondistended, nontender. No rebound, guarding, rigidity. Normoactive bowel sounds present in all four quadrants.  EXTREMITIES: Mild bilateral pedal edema, cyanosis, or clubbing. No calf tenderness or Homan's sign.  NEUROLOGIC: The patient is unable to comply with exam.  SKIN: Warm, dry, and intact without obvious rash, lesion, or ulcer.    Labs on Admission:  CBC: Recent Labs  Lab 03/15/22 1519  WBC 11.4*  NEUTROABS 9.8*  HGB 8.8*  HCT 27.1*  MCV 92.2  PLT 810*   Basic Metabolic Panel: Recent Labs  Lab 03/15/22 1519  NA 125*  K 4.0  CL 90*  CO2 21*  GLUCOSE 604*  BUN 50*  CREATININE 2.34*  CALCIUM 8.5*   GFR: Estimated Creatinine Clearance: 48 mL/min (A) (by C-G formula based on SCr of 2.34 mg/dL (H)). Liver Function Tests: Recent Labs  Lab 03/15/22 1519  AST 26  ALT 19  ALKPHOS 85  BILITOT 2.1*  PROT 6.7  ALBUMIN 3.2*   No results for input(s): "LIPASE", "AMYLASE" in the last 168 hours. No results for input(s): "AMMONIA" in the last 168 hours. Coagulation Profile: Recent Labs  Lab 03/15/22 1519  INR 1.6*   Cardiac Enzymes: Recent Labs  Lab 03/15/22 1519  CKTOTAL 212   BNP (last 3 results) No results for input(s):  "PROBNP" in the last 8760 hours. HbA1C: No results for input(s): "HGBA1C" in the last 72 hours. CBG: Recent Labs  Lab 03/15/22 1451 03/15/22 1459  GLUCAP 579* 582*   Lipid Profile: No results for input(s): "CHOL", "HDL", "LDLCALC", "TRIG", "CHOLHDL", "LDLDIRECT" in the last 72 hours. Thyroid Function Tests: No results for input(s): "TSH", "T4TOTAL", "FREET4", "T3FREE", "THYROIDAB" in the last 72 hours. Anemia Panel: No results for input(s): "VITAMINB12", "FOLATE", "FERRITIN", "TIBC", "IRON", "RETICCTPCT" in the last 72 hours. Urine analysis:    Component Value Date/Time   COLORURINE AMBER (A) 03/15/2022 1519   APPEARANCEUR TURBID (A) 03/15/2022 1519   LABSPEC 1.017 03/15/2022 1519   PHURINE 5.0 03/15/2022 1519   GLUCOSEU >=500 (A) 03/15/2022 1519   HGBUR LARGE (A) 03/15/2022 1519   BILIRUBINUR NEGATIVE 03/15/2022 1519   KETONESUR 5 (A) 03/15/2022 1519   PROTEINUR >=300 (A) 03/15/2022 1519   NITRITE NEGATIVE 03/15/2022 1519   LEUKOCYTESUR LARGE (A) 03/15/2022 1519   Sepsis Labs: '@LABRCNTIP'$ (procalcitonin:4,lacticidven:4) ) Recent Results (from the past 240 hour(s))  Resp Panel by RT-PCR (Flu A&B, Covid) Anterior Nasal Swab     Status: None   Collection Time: 03/15/22  3:19 PM   Specimen: Anterior Nasal Swab  Result Value Ref Range Status   SARS Coronavirus 2 by RT PCR NEGATIVE NEGATIVE Final    Comment: (NOTE) SARS-CoV-2 target nucleic acids are NOT DETECTED.  The SARS-CoV-2 RNA is generally detectable in upper respiratory specimens during the acute phase of infection. The lowest concentration of SARS-CoV-2 viral copies this assay can detect is 138 copies/mL. A negative result does not preclude SARS-Cov-2 infection and should not be used as the sole basis for treatment or other patient management decisions. A negative result may occur with  improper specimen collection/handling, submission of specimen other than nasopharyngeal swab, presence of viral mutation(s) within  the areas targeted by this assay, and inadequate number of viral copies(<138 copies/mL). A negative result must be combined with clinical observations, patient history, and epidemiological information. The expected result is Negative.  Fact Sheet for Patients:  EntrepreneurPulse.com.au  Fact Sheet for Healthcare Providers:  IncredibleEmployment.be  This test is no t yet approved or cleared by the [place] FDA and  has been authorized for detection and/or diagnosis of SARS-CoV-2 by FDA under an Emergency Use Authorization (EUA). This EUA will remain  in effect (meaning this test can be used) for the duration of the COVID-19 declaration under Section 564(b)(1) of the Act, 21 U.S.C.section 360bbb-3(b)(1), unless the authorization is terminated  or revoked sooner.       Influenza A by PCR NEGATIVE NEGATIVE Final   Influenza B by PCR NEGATIVE NEGATIVE Final    Comment: (NOTE) The Xpert Xpress SARS-CoV-2/FLU/RSV plus assay is intended as an aid in the diagnosis of influenza from Nasopharyngeal swab specimens and should not be used as a sole basis for treatment. Nasal washings and aspirates are unacceptable for Xpert Xpress SARS-CoV-2/FLU/RSV testing.  Fact Sheet for Patients: EntrepreneurPulse.com.au  Fact Sheet for Healthcare Providers: IncredibleEmployment.be  This test is not yet approved or cleared by the [place] FDA and has been authorized for detection and/or diagnosis of SARS-CoV-2 by FDA under an Emergency Use Authorization (EUA). This EUA will remain in effect (meaning this test can be used) for the duration of the COVID-19 declaration under Section 564(b)(1) of the Act, 21 U.S.C. section 360bbb-3(b)(1), unless the authorization is terminated or revoked.  Performed at [facility], [address] [place], [place] [address]      Radiological Exams on Admission: DG Chest  [facility] 1 View  Result Date: 03/15/2022 CLINICAL DATA:  Questionable sepsis. Evaluate for abnormality. Hyperglycemia. EXAM: PORTABLE CHEST 1 VIEW COMPARISON:  AP chest 02/13/2022, 10/24/2021 FINDINGS: Status post median sternotomy. Cardiac silhouette is again moderately enlarged. Tortuous thoracic aorta is similar to prior. Improved aeration of bilateral lungs with apparent resolution of the prior interstitial thickening. No focal airspace opacity to indicate pneumonia. No pleural effusion or pneumothorax. Moderate multilevel disc space narrowing and bridging osteophytes of the thoracic spine. IMPRESSION: Stable moderate cardiomegaly. Improved aeration of bilateral lungs with apparent resolution of the prior interstitial pulmonary edema. No focal airspace opacity to indicate pneumonia. Electronically Signed   By: Yvonne Kendall M.D.   On: 03/15/2022 15:56    EKG: A flutter at 104 bpm with normal axis and nonspecific ST-T wave changes.   Assessment/Plan  This is a 66 y.o. male with a history of atrial fibrillation on Eliquis, CHF, COPD, CAD, diabetes, hypertension, hyperlipidemia now being admitted with:  #. Septic shock secondary to UTI - Admit to inpatient stepdown with telemetry monitoring - IV antibiotics: Cefepime, Vanco - Gentle IV fluid hydration - Follow up blood,urine & sputum cultures - Repeat CBC in am.  - CCM consulted for central line and pressor support.   #.  HHS -IV insulin drip via Endo tool -Aggressive IV fluid  -NPO -BMP every 4 hours  -Replace electrolytes as needed  -Follow up cultures -Hold home insulin and metformin for now  #. Acute kidney injury  - IV fluids and repeat BMP in AM.  - Avoid nephrotoxic medications - Bladder scan and place foley catheter if evidence of urinary retention  #.  New normocytic anemia -Trend CBC - Check iron studies, B12, folate, stool guaiac  #. Elevated bilirubin - Check RUQ [place]  #. History of atrial fibrillation - Continue  amiodarone, Eliquis, Cardizem  #. History of CHF - Continue Lasix  #. History of CAD - Continue aspirin, Zetia  #.  History of COPD - Continue Pulmicort, O2 and nebs as  needed  #.  History of BPH - Continue Proscar and tamsulosin  #.  History of hypothyroidism - Continue Synthroid  Admission status: Inpatient stepdown IV Fluids: LR and D5 per protocol Diet/Nutrition: N.p.o. for now Consults called: CCM DVT Px: Eliquis SCDs and early ambulation. Code Status: Full Code  Disposition Plan: To home in 2-3 days  All the records are reviewed and case discussed with ED provider. Management plans discussed with the patient and/or family who express understanding and agree with plan of care.  Leily Capek D.O. on 03/15/2022 at 7:30 PM CC: Primary care physician; [name], MD   03/15/2022, 7:30 PM

## 2022-03-15 NOTE — Sepsis Progress Note (Signed)
Code Sepsis protocol being monitored by eLink. 

## 2022-03-15 NOTE — ED Triage Notes (Signed)
Pt here for an evaluation. Pt cbg was in the 400s and the wife gave 27 units of insulin. Pt wants to be evaluated for a UTI. Pt states he is confused and not eating as well.   105 95% 2L  137/107

## 2022-03-15 NOTE — Code Documentation (Signed)
CODE SEPSIS - PHARMACY COMMUNICATION  **Broad Spectrum Antibiotics should be administered within 1 hour of Sepsis diagnosis**  Time Code Sepsis Called/Page Received: 1512  Antibiotics Ordered: cefepime , vancomycin   Time of 1st antibiotic administration: [place] ,PharmD Clinical Pharmacist  03/15/2022  3:30 PM

## 2022-03-15 NOTE — ED Notes (Signed)
Bg 582

## 2022-03-15 NOTE — Consult Note (Signed)
Pharmacy Antibiotic Note  [name]. is a 66 y.o. male admitted on 03/15/2022 with sepsis.  Pharmacy has been consulted for Vancomycin dosing.  Presented to ED with elevated blood glucose and weakness.  Chest CT showed no pulmonary infiltrates   AKI with current Scr = 2.34.  During previous hospitalization in [place] of this year Scr ~ 1.  Plan: -Patient received a total of Vancomycin 2500 mg IV as a loading dose. -Dose based on levels due to AKI  Height: '6\' 4"'$  (193 cm) Weight: (!) 146.9 kg (323 lb 13.7 oz) IBW/kg (Calculated) : 86.8  Temp (24hrs), Avg:98.1 F (36.7 C), Min:98 F (36.7 C), Max:98.1 F (36.7 C)  Recent Labs  Lab 03/15/22 1519 03/15/22 1720  WBC 11.4*  --   CREATININE 2.34*  --   LATICACIDVEN 2.2* 1.9    Estimated Creatinine Clearance: 48 mL/min (A) (by C-G formula based on SCr of 2.34 mg/dL (H)).    Allergies  Allergen Reactions   Penicillins Other (See Comments)    Happened at 66 years old and pt. stated he passed out  He has tolerated amoxicillin/clavulanate and ampicillin/sulbactam   Statins     Other reaction(s): Muscle Pain Causes legs to ache per pt   Metformin And Related Diarrhea    Antimicrobials this admission: cefepime 11/16>> vancomycin 11/16 >>   Dose adjustments this admission: [name]  Microbiology results: 11/16 BCx: pending 11/16 UCx: pending   Thank you for allowing pharmacy to be a part of this patient's care.  Lorin Picket, PharmD 03/15/2022 8:08 PM

## 2022-03-15 NOTE — ED Notes (Signed)
Report given via telephone.  Pt taken via stretcher to ICU 6.

## 2022-03-15 NOTE — ED Provider Notes (Signed)
[facility] Provider Note    Event Date/Time   First MD Initiated Contact with Patient 03/15/22 1502     (approximate)   History   Hyperglycemia   HPI  [name]. is a 66 y.o. male with a history of asthma, COPD, CHF, CAD, left BKA, type 2 diabetes, gout, hypertension, dyslipidemia, and OSA who presents with altered mental status and hyperglycemia over the last day.  The wife states that he became somewhat confused yesterday.  He tried to use the lift to get into bed sometime last night and ended up on the floor, although it does not appear that he fell.  The patient denies any acute pain.  His glucose reading was over 400 today at home and his blood pressure was noted to be borderline low by the physical therapist.  The patient had a urinalysis done a few days ago but the wife has not heard the results yet.  I reviewed the past medical records.  The patient had a UA from 11/14 which shows some WBCs and bacteria and is nitrite positive.  Per the hospitalist notes, the patient was admitted in October with falls and generalized weakness and was treated for CHF exacerbation and atrial fibrillation.   Physical Exam   Triage Vital Signs: ED Triage Vitals  Enc Vitals Group     BP 03/15/22 1449 (!) 73/43     Pulse Rate 03/15/22 1446 (!) 108     Resp 03/15/22 1446 20     Temp 03/15/22 1446 98.1 F (36.7 C)     Temp Source 03/15/22 1446 Oral     SpO2 03/15/22 1446 97 %     Weight 03/15/22 1448 (!) 323 lb 13.7 oz (146.9 kg)     Height 03/15/22 1448 '6\' 4"'$  (1.93 m)     Head Circumference --      Peak Flow --      Pain Score 03/15/22 1448 0     Pain Loc --      Pain Edu? --      Excl. in [place]? --     Most recent vital signs: Vitals:   03/15/22 1800 03/15/22 1830  BP: (!) 100/45 112/69  Pulse: (!) 102 (!) 104  Resp: 19 20  Temp:    SpO2: 100% 100%     General: Alert and oriented x4, weak appearing but in no distress.  CV:  Good peripheral  perfusion.  Normal heart sounds. Resp:  Normal effort.  Lungs CTAB. Abd:  Soft and nontender.  No distention.  Other:  EOMI.  PERRLA.  No facial droop.  Motor intact in all extremities.  Dry mucous membranes.   ED Results / Procedures / Treatments   Labs (all labs ordered are listed, but only abnormal results are displayed) Labs Reviewed  LACTIC ACID, PLASMA - Abnormal; Notable for the following components:      Result Value   Lactic Acid, Venous 2.2 (*)    All other components within normal limits  COMPREHENSIVE METABOLIC PANEL - Abnormal; Notable for the following components:   Sodium 125 (*)    Chloride 90 (*)    CO2 21 (*)    Glucose, Bld 604 (*)    BUN 50 (*)    Creatinine, Ser 2.34 (*)    Calcium 8.5 (*)    Albumin 3.2 (*)    Total Bilirubin 2.1 (*)    GFR, Estimated 30 (*)    All other components within normal  limits  CBC WITH DIFFERENTIAL/PLATELET - Abnormal; Notable for the following components:   WBC 11.4 (*)    RBC 2.94 (*)    Hemoglobin 8.8 (*)    HCT 27.1 (*)    Platelets 124 (*)    Neutro Abs 9.8 (*)    All other components within normal limits  PROTIME-INR - Abnormal; Notable for the following components:   Prothrombin Time 18.5 (*)    INR 1.6 (*)    All other components within normal limits  APTT - Abnormal; Notable for the following components:   aPTT 37 (*)    All other components within normal limits  URINALYSIS, COMPLETE (UACMP) WITH MICROSCOPIC - Abnormal; Notable for the following components:   Color, Urine AMBER (*)    APPearance TURBID (*)    Glucose, UA >=500 (*)    Hgb urine dipstick LARGE (*)    Ketones, ur 5 (*)    Protein, ur >=300 (*)    Leukocytes,Ua LARGE (*)    WBC, UA >50 (*)    Bacteria, UA MANY (*)    Non Squamous Epithelial PRESENT (*)    All other components within normal limits  BLOOD GAS, VENOUS - Abnormal; Notable for the following components:   pO2, Ven <31 (*)    All other components within normal limits  CBG  MONITORING, ED - Abnormal; Notable for the following components:   Glucose-Capillary 582 (*)    All other components within normal limits  CBG MONITORING, ED - Abnormal; Notable for the following components:   Glucose-Capillary 579 (*)    All other components within normal limits  TROPONIN I (HIGH SENSITIVITY) - Abnormal; Notable for the following components:   Troponin I (High Sensitivity) 57 (*)    All other components within normal limits  RESP PANEL BY RT-PCR (FLU A&B, COVID) ARPGX2  CULTURE, BLOOD (ROUTINE X 2)  CULTURE, BLOOD (ROUTINE X 2)  URINE CULTURE  LACTIC ACID, PLASMA  CK  POTASSIUM  POTASSIUM  POTASSIUM  TROPONIN I (HIGH SENSITIVITY)     EKG  ED ECG REPORT I, Arta Silence, the attending physician, personally viewed and interpreted this ECG.  Date: 03/15/2022 EKG Time: 1506 Rate: 104 Rhythm: Atrial flutter QRS Axis: normal Intervals: RBBB, LAFB ST/T Wave abnormalities: normal Narrative Interpretation: no evidence of acute ischemia    RADIOLOGY  Chest x-ray: I independently viewed and interpreted the images; there is no focal lesion or edema  PROCEDURES:  Critical Care performed: Yes, see critical care procedure note(s)  .Critical Care  Performed by: Arta Silence, MD Authorized by: Arta Silence, MD   Critical care provider statement:    Critical care time (minutes):  60   Critical care time was exclusive of:  Separately billable procedures and treating other patients   Critical care was necessary to treat or prevent imminent or life-threatening deterioration of the following conditions:  Sepsis, circulatory failure and endocrine crisis   Critical care was time spent personally by me on the following activities:  Development of treatment plan with patient or surrogate, discussions with consultants, evaluation of patient's response to treatment, examination of patient, ordering and review of laboratory studies, ordering and review of  radiographic studies, ordering and performing treatments and interventions, pulse oximetry, re-evaluation of patient's condition, review of old charts and obtaining history from patient or surrogate   Care discussed with: admitting provider      MEDICATIONS ORDERED IN ED: Medications  sodium chloride 0.9 % bolus 1,000 mL (1,000 mLs Intravenous Not  Given 03/15/22 1512)  lactated ringers bolus 1,000 mL (0 mLs Intravenous Stopped 03/15/22 1720)    And  lactated ringers bolus 1,000 mL (0 mLs Intravenous Stopped 03/15/22 1857)    And  lactated ringers bolus 800 mL (800 mLs Intravenous New Bag/Given 03/15/22 1900)  vancomycin (VANCOCIN) IVPB 1000 mg/200 mL premix (0 mg Intravenous Stopped 03/15/22 1736)    Followed by  vancomycin (VANCOREADY) IVPB 1500 mg/300 mL (1,500 mg Intravenous New Bag/Given 03/15/22 1905)  insulin regular, human (MYXREDLIN) 100 units/ 100 mL infusion (11.5 Units/hr Intravenous New Bag/Given 03/15/22 1856)  lactated ringers infusion (has no administration in time range)  dextrose 5 % in lactated ringers infusion (has no administration in time range)  dextrose 50 % solution 0-50 mL (has no administration in time range)  ceFEPIme (MAXIPIME) 2 g in sodium chloride 0.9 % 100 mL IVPB (0 g Intravenous Stopped 03/15/22 1614)     IMPRESSION / MDM / ASSESSMENT AND PLAN / ED COURSE  I reviewed the triage vital signs and the nursing notes.  66 year old male with PMH as noted above presents with increased generalized weakness and confusion along with elevated blood glucose level.  Physical exam reveals hypotension and tachycardia.  Mucous membranes are dry.  Neurologic exam is nonfocal.  Differential diagnosis includes, but is not limited to, acute infection/sepsis, most likely UTI, COVID-19 or other infection, DKA, HHS, or other hyperglycemic crisis, AKI, electrolyte abnormality, other metabolic derangement, less likely cardiac cause.  I have ordered lab work-up and fluids per the  sepsis protocol as well as empiric IV antibiotics.  I anticipate that we will start the patient on an insulin infusion once I get his initial labs, and plan will be for admission.  Patient's presentation is most consistent with acute presentation with potential threat to life or bodily function.  The patient is on the cardiac monitor to evaluate for evidence of arrhythmia and/or significant heart rate changes.  ----------------------------------------- 7:13 PM on 03/15/2022 -----------------------------------------  Lab work-up shows findings consistent with urinary tract infection.  There is mild leukocytosis.  Lactate is not significantly elevated.  Glucose is 600 but there is acidosis or elevated anion gap.  The patient's blood pressure was low although is improving now after fluids.  He otherwise remains stable.  An insulin infusion was initiated.  He has received empiric IV antibiotics per the sepsis protocol.  I consulted Dr. Ara Kussmaul from the hospitalist service; based on our discussion she agrees to admit the patient.   FINAL CLINICAL IMPRESSION(S) / ED DIAGNOSES   Final diagnoses:  Sepsis, due to unspecified organism, unspecified whether acute organ dysfunction present [facility])  Urinary tract infection without hematuria, site unspecified  Hyperglycemia     Rx / DC Orders   ED Discharge Orders     None        Note:  This document was prepared using Dragon voice recognition software and may include unintentional dictation errors.    Arta Silence, MD 03/15/22 [phone]

## 2022-03-16 ENCOUNTER — Inpatient Hospital Stay: Payer: Medicare Other

## 2022-03-16 DIAGNOSIS — R739 Hyperglycemia, unspecified: Secondary | ICD-10-CM

## 2022-03-16 DIAGNOSIS — N390 Urinary tract infection, site not specified: Secondary | ICD-10-CM

## 2022-03-16 DIAGNOSIS — A419 Sepsis, unspecified organism: Secondary | ICD-10-CM | POA: Diagnosis not present

## 2022-03-16 DIAGNOSIS — R579 Shock, unspecified: Secondary | ICD-10-CM

## 2022-03-16 DIAGNOSIS — N12 Tubulo-interstitial nephritis, not specified as acute or chronic: Secondary | ICD-10-CM | POA: Diagnosis not present

## 2022-03-16 DIAGNOSIS — K7689 Other specified diseases of liver: Secondary | ICD-10-CM | POA: Diagnosis not present

## 2022-03-16 LAB — GLUCOSE, CAPILLARY
Glucose-Capillary: 176 mg/dL — ABNORMAL HIGH (ref 70–99)
Glucose-Capillary: 180 mg/dL — ABNORMAL HIGH (ref 70–99)
Glucose-Capillary: 197 mg/dL — ABNORMAL HIGH (ref 70–99)
Glucose-Capillary: 198 mg/dL — ABNORMAL HIGH (ref 70–99)
Glucose-Capillary: 203 mg/dL — ABNORMAL HIGH (ref 70–99)
Glucose-Capillary: 214 mg/dL — ABNORMAL HIGH (ref 70–99)
Glucose-Capillary: 217 mg/dL — ABNORMAL HIGH (ref 70–99)
Glucose-Capillary: 242 mg/dL — ABNORMAL HIGH (ref 70–99)
Glucose-Capillary: 249 mg/dL — ABNORMAL HIGH (ref 70–99)
Glucose-Capillary: 263 mg/dL — ABNORMAL HIGH (ref 70–99)
Glucose-Capillary: 294 mg/dL — ABNORMAL HIGH (ref 70–99)
Glucose-Capillary: 458 mg/dL — ABNORMAL HIGH (ref 70–99)

## 2022-03-16 LAB — BLOOD CULTURE ID PANEL (REFLEXED) - BCID2

## 2022-03-16 LAB — BASIC METABOLIC PANEL
Anion gap: 10 (ref 5–15)
Anion gap: 10 (ref 5–15)
Anion gap: 12 (ref 5–15)
BUN: 54 mg/dL — ABNORMAL HIGH (ref 8–23)
BUN: 55 mg/dL — ABNORMAL HIGH (ref 8–23)
BUN: 55 mg/dL — ABNORMAL HIGH (ref 8–23)
CO2: 24 mmol/L (ref 22–32)
CO2: 24 mmol/L (ref 22–32)
CO2: 22 mmol/L (ref 22–32)
Calcium: 8 mg/dL — ABNORMAL LOW (ref 8.9–10.3)
Calcium: 8.3 mg/dL — ABNORMAL LOW (ref 8.9–10.3)
Calcium: 7.9 mg/dL — ABNORMAL LOW (ref 8.9–10.3)
Chloride: 95 mmol/L — ABNORMAL LOW (ref 98–111)
Chloride: 95 mmol/L — ABNORMAL LOW (ref 98–111)
Chloride: 97 mmol/L — ABNORMAL LOW (ref 98–111)
Creatinine, Ser: 2.29 mg/dL — ABNORMAL HIGH (ref 0.61–1.24)
Creatinine, Ser: 2.35 mg/dL — ABNORMAL HIGH (ref 0.61–1.24)
Creatinine, Ser: 2.24 mg/dL — ABNORMAL HIGH (ref 0.61–1.24)
GFR, Estimated: 31 mL/min — ABNORMAL LOW (ref >60)
GFR, Estimated: 30 mL/min — ABNORMAL LOW (ref >60)
GFR, Estimated: 31 mL/min — ABNORMAL LOW (ref >60)
Glucose, Bld: 224 mg/dL — ABNORMAL HIGH (ref 70–99)
Glucose, Bld: 191 mg/dL — ABNORMAL HIGH (ref 70–99)
Glucose, Bld: 268 mg/dL — ABNORMAL HIGH (ref 70–99)
Potassium: 3.6 mmol/L (ref 3.5–5.1)
Potassium: 3.9 mmol/L (ref 3.5–5.1)
Potassium: 3.9 mmol/L (ref 3.5–5.1)
Sodium: 129 mmol/L — ABNORMAL LOW (ref 135–145)
Sodium: 129 mmol/L — ABNORMAL LOW (ref 135–145)
Sodium: 131 mmol/L — ABNORMAL LOW (ref 135–145)

## 2022-03-16 LAB — CBC
HCT: 23.3 % — ABNORMAL LOW (ref 39.0–52.0)
Hemoglobin: 7.6 g/dL — ABNORMAL LOW (ref 13.0–17.0)
MCH: 29.5 pg (ref 26.0–34.0)
MCHC: 32.6 g/dL (ref 30.0–36.0)
MCV: 90.3 fL (ref 80.0–100.0)
Platelets: 104 10*3/uL — ABNORMAL LOW (ref 150–400)
RBC: 2.58 MIL/uL — ABNORMAL LOW (ref 4.22–5.81)
RDW: 15.5 % (ref 11.5–15.5)
WBC: 7 10*3/uL (ref 4.0–10.5)
nRBC: 0 % (ref 0.0–0.2)

## 2022-03-16 LAB — PREPARE RBC (CROSSMATCH)

## 2022-03-16 LAB — HEMOGLOBIN A1C
Hgb A1c MFr Bld: 8.9 % — ABNORMAL HIGH (ref 4.8–5.6)
Mean Plasma Glucose: 209 mg/dL

## 2022-03-16 LAB — PROTIME-INR
INR: 1.4 — ABNORMAL HIGH (ref 0.8–1.2)
Prothrombin Time: 16.6 seconds — ABNORMAL HIGH (ref 11.4–15.2)

## 2022-03-16 LAB — LIPASE, BLOOD: Lipase: 22 U/L (ref 11–51)

## 2022-03-16 LAB — HEMOGLOBIN AND HEMATOCRIT, BLOOD
HCT: 24.7 % — ABNORMAL LOW (ref 39.0–52.0)
Hemoglobin: 8.2 g/dL — ABNORMAL LOW (ref 13.0–17.0)

## 2022-03-16 LAB — PROCALCITONIN: Procalcitonin: 14.2 ng/mL

## 2022-03-16 LAB — BRAIN NATRIURETIC PEPTIDE: B Natriuretic Peptide: 205.4 pg/mL — ABNORMAL HIGH (ref 0.0–100.0)

## 2022-03-16 LAB — APTT: aPTT: 36 seconds (ref 24–36)

## 2022-03-16 LAB — VITAMIN B12: Vitamin B-12: >7500 pg/mL — ABNORMAL HIGH (ref 180–914)

## 2022-03-16 MED ORDER — NOREPINEPHRINE 4 MG/250ML-% IV SOLN
0.0000 ug/min | INTRAVENOUS | Status: DC
  Administered 2022-03-16: 17 ug/min via INTRAVENOUS
  Filled 2022-03-16 (×2): qty 250

## 2022-03-16 MED ORDER — SODIUM CHLORIDE 0.9 % IV BOLUS
1000.0000 mL | Freq: Once | INTRAVENOUS | Status: AC
  Administered 2022-03-16: 1000 mL via INTRAVENOUS

## 2022-03-16 MED ORDER — VASOPRESSIN 20 UNITS/100 ML INFUSION FOR SHOCK
0.0000 [IU]/min | INTRAVENOUS | Status: DC
  Administered 2022-03-16 – 2022-03-17 (×4): 0.03 [IU]/min via INTRAVENOUS
  Filled 2022-03-16 (×5): qty 100

## 2022-03-16 MED ORDER — INSULIN ASPART 100 UNIT/ML IJ SOLN
0.0000 [IU] | INTRAMUSCULAR | Status: DC
  Administered 2022-03-16 (×2): 3 [IU] via SUBCUTANEOUS
  Filled 2022-03-16 (×2): qty 1

## 2022-03-16 MED ORDER — SODIUM CHLORIDE 0.9 % IV SOLN
2.0000 g | INTRAVENOUS | Status: DC
  Administered 2022-03-16 – 2022-03-17 (×2): 2 g via INTRAVENOUS
  Filled 2022-03-16: qty 2
  Filled 2022-03-16: qty 20
  Filled 2022-03-16: qty 2

## 2022-03-16 MED ORDER — FENTANYL CITRATE PF 50 MCG/ML IJ SOSY
PREFILLED_SYRINGE | INTRAMUSCULAR | Status: AC
  Administered 2022-03-16: 50 ug via INTRAVENOUS
  Filled 2022-03-16: qty 1

## 2022-03-16 MED ORDER — SODIUM CHLORIDE 0.9 % IV SOLN: INTRAVENOUS | Status: DC

## 2022-03-16 MED ORDER — SODIUM CHLORIDE 0.9% IV SOLUTION: Freq: Once | INTRAVENOUS | Status: DC

## 2022-03-16 MED ORDER — DEXTROSE-NACL 5-0.9 % IV SOLN: INTRAVENOUS | Status: DC

## 2022-03-16 MED ORDER — SODIUM CHLORIDE 0.9 % IV BOLUS: 500.0000 mL | Freq: Once | INTRAVENOUS | Status: DC

## 2022-03-16 MED ORDER — INSULIN DETEMIR 100 UNIT/ML ~~LOC~~ SOLN
15.0000 [IU] | Freq: Two times a day (BID) | SUBCUTANEOUS | Status: DC
  Administered 2022-03-16 – 2022-03-23 (×14): 15 [IU] via SUBCUTANEOUS
  Filled 2022-03-16 (×16): qty 0.15

## 2022-03-16 MED ORDER — INSULIN ASPART 100 UNIT/ML IJ SOLN
0.0000 [IU] | INTRAMUSCULAR | Status: DC
  Administered 2022-03-16: 11 [IU] via SUBCUTANEOUS
  Administered 2022-03-17: 4 [IU] via SUBCUTANEOUS
  Administered 2022-03-17 (×3): 7 [IU] via SUBCUTANEOUS
  Administered 2022-03-17: 4 [IU] via SUBCUTANEOUS
  Administered 2022-03-17: 7 [IU] via SUBCUTANEOUS
  Administered 2022-03-17 – 2022-03-18 (×2): 4 [IU] via SUBCUTANEOUS
  Administered 2022-03-18: 3 [IU] via SUBCUTANEOUS
  Administered 2022-03-18 (×2): 4 [IU] via SUBCUTANEOUS
  Administered 2022-03-19: 3 [IU] via SUBCUTANEOUS
  Administered 2022-03-19: 4 [IU] via SUBCUTANEOUS
  Administered 2022-03-19: 7 [IU] via SUBCUTANEOUS
  Administered 2022-03-19: 11 [IU] via SUBCUTANEOUS
  Administered 2022-03-19 – 2022-03-21 (×8): 4 [IU] via SUBCUTANEOUS
  Administered 2022-03-21: 3 [IU] via SUBCUTANEOUS
  Administered 2022-03-21: 4 [IU] via SUBCUTANEOUS
  Administered 2022-03-21: 3 [IU] via SUBCUTANEOUS
  Administered 2022-03-22: 4 [IU] via SUBCUTANEOUS
  Administered 2022-03-22: 3 [IU] via SUBCUTANEOUS
  Administered 2022-03-22: 7 [IU] via SUBCUTANEOUS
  Administered 2022-03-22: 11 [IU] via SUBCUTANEOUS
  Administered 2022-03-22: 4 [IU] via SUBCUTANEOUS
  Administered 2022-03-23: 3 [IU] via SUBCUTANEOUS
  Administered 2022-03-23: 7 [IU] via SUBCUTANEOUS
  Administered 2022-03-23: 4 [IU] via SUBCUTANEOUS
  Filled 2022-03-16 (×33): qty 1

## 2022-03-16 MED ORDER — FENTANYL CITRATE PF 50 MCG/ML IJ SOSY: 50.0000 ug | PREFILLED_SYRINGE | Freq: Once | INTRAMUSCULAR | Status: AC

## 2022-03-16 MED ORDER — PANTOPRAZOLE SODIUM 40 MG IV SOLR
40.0000 mg | INTRAVENOUS | Status: DC
  Administered 2022-03-16 – 2022-03-17 (×2): 40 mg via INTRAVENOUS
  Filled 2022-03-16 (×2): qty 10

## 2022-03-16 MED ORDER — LEVOFLOXACIN IN D5W 750 MG/150ML IV SOLN: 750.0000 mg | INTRAVENOUS | Status: DC

## 2022-03-16 MED ORDER — NOREPINEPHRINE 16 MG/250ML-% IV SOLN
0.0000 ug/min | INTRAVENOUS | Status: DC
  Administered 2022-03-16: 20 ug/min via INTRAVENOUS
  Filled 2022-03-16: qty 250

## 2022-03-16 NOTE — Procedures (Addendum)
Central Venous Catheter Insertion Procedure Note  [name]  [id number]  12/19/1955  Date:03/16/22  Time:5:12 AM   Provider Performing:Mykira Hofmeister L [place]   Procedure: Insertion of Non-tunneled Central Venous [phone]) with US guidance ([id number])   Indication(s) Medication administration  Consent Risks of the procedure as well as the alternatives and risks of each were explained to the patient and/or caregiver.  Consent for the procedure was obtained and is signed in the bedside chart  Anesthesia Topical only with 1% lidocaine  & fentanyl IVP  Timeout Verified patient identification, verified procedure, site/side was marked, verified correct patient position, special equipment/implants available, medications/allergies/relevant history reviewed, required imaging and test results available.  Sterile Technique Maximal sterile technique including full sterile barrier drape, hand hygiene, sterile gown, sterile gloves, mask, hair covering, sterile ultrasound probe cover (if used).  Procedure Description Area of catheter insertion was cleaned with chlorhexidine and draped in sterile fashion.  With real-time ultrasound guidance a central venous catheter was placed into the left internal jugular vein. Nonpulsatile blood flow and easy flushing noted in all ports.  The catheter was sutured in place and sterile dressing applied.  Complications/Tolerance None; patient tolerated the procedure well. Chest X-ray is ordered to verify placement for internal jugular or subclavian cannulation.   Chest x-ray is not ordered for femoral cannulation.  EBL Minimal  Specimen(s) None  [name], AGACNP-BC Acute Care Nurse Practitioner [facility]   [phone] / [phone] Please see Amion for pager details.

## 2022-03-16 NOTE — Progress Notes (Signed)
Pulmonary and Critical care    NAME:  [name]., MRN:  [id number], DOB:  11-20-1955, LOS: 1 ADMISSION DATE:  03/15/2022,     History of Present Illness:  The patient is a 66 year old male with history of asthma COPD CHF diabetes prior history of left BKA recent hospitalization for fall and atrial fibrillation. The patient came to the emergency room on November 16 for altered mental status confusion and hyperglycemia at home.  The patient had a fall at home.  Wife was unable to use left to get patient into bed. In the emergency room patient had blood pressure 73/43 pulse 108 temperature 98.1 F. Lactic acid was 2.2 sodium 125 bicarbonate 21 creatinine 2.3 WBC count 11.4 hemoglobin 8.8 platelet count 124. Patient was noted to have atrial flutter in the ER.  The patient was started on insulin infusion and was started on empiric antibiotics for urinary tract infection with sepsis protocol. The patient had CT of the abdomen done which showed large subcapsular hematoma of the right kidney as well as findings of large amount of gas concerning for emphysematous pyelonephritis.  Blood cultures are also positive for gram-negative rods. The patient has been evaluated by urology, not a candidate for percutaneous nephrostomy due to recent Eliquis use.  Will be closely monitored if ureteral stenting is needed . Review of systems cannot be done        03/16/2022    6:00 AM 03/16/2022    4:30 AM 03/16/2022    4:15 AM  Vitals with BMI  Systolic 127 517 001  Diastolic 52 52 53  Pulse 94 97 97    FiO2 (%):  [36 %] 36 %   Examination: General: Sleepy opens eyes briefly with verbal stimulus HENT: Pallor present no icterus no JVD Lungs: Air entry heard bilaterally diminished no wheezing Cardiovascular: No murmurs or gallops Abdomen: Mildly distended bowel sounds were heard. Extremities: BKA present.  Pulses palpable Neuro: Did not move extremities to commands or  spontaneously.     Assessment & Plan:  1.  Septic shock, with bacteremia and urinary tract infection emphysematous pyelonephritis. 2.  Acute kidney injury. 3.  Type 2 diabetes with hyperglycemia. 4.  Metabolic encephalopathy. 5.  Subcapsular hematoma right kidney. 6.  Right pleural effusion with atelectasis. 7.  History of COPD and CHF 8.  Hepatic steatosis 9.  Hypothyroidism 10.  Atrial flutter Plan. Continue on ceftriaxone.  Continue on Levophed with vasopressin Likely will stop vancomycin tomorrow. Transition from insulin infusion to sliding scale insulin We will consider amiodarone infusion if heart rate increases We will require intubation if stenting is planned urology   Best Practice (right click and "Reselect all SmartList Selections" daily)   Diet/type: NPO DVT prophylaxis: SCD GI prophylaxis: PPI Lines: Central line Foley:  Yes, and it is still needed Code Status:  full code   Labs   CBC: Recent Labs  Lab 03/15/22 1519  WBC 11.4*  NEUTROABS 9.8*  HGB 8.8*  HCT 27.1*  MCV 92.2  PLT 124*    Basic Metabolic Panel: Recent Labs  Lab 03/15/22 1519 03/15/22 2109 03/16/22 0257 03/16/22 0703  NA 125* 127* 129* 129*  K 4.0 3.7 3.6 3.9  CL 90* 92* 95* 95*  CO2 21* '24 24 24  '$ GLUCOSE 604* 485* 224* 191*  BUN 50* 50* 54* 55*  CREATININE 2.34* 2.23* 2.29* 2.35*  CALCIUM 8.5* 8.0* 8.0* 8.3*  MG  --  1.9  --   --   PHOS  --  3.9  --   --    GFR: Estimated Creatinine Clearance: 47.8 mL/min (A) (by C-G formula based on SCr of 2.35 mg/dL (H)). Recent Labs  Lab 03/15/22 1519 03/15/22 1720 03/15/22 2109 03/15/22 2241 03/16/22 0045  PROCALCITON  --   --   --   --  14.20  WBC 11.4*  --   --   --   --   LATICACIDVEN 2.2* 1.9 2.1* 1.7  --     Liver Function Tests: Recent Labs  Lab 03/15/22 1519  AST 26  ALT 19  ALKPHOS 85  BILITOT 2.1*  PROT 6.7  ALBUMIN 3.2*   Recent Labs  Lab 03/16/22 0257  LIPASE 22   No results for input(s): "AMMONIA"  in the last 168 hours.  ABG    Component Value Date/Time   PHART 7.4 03/15/2022 2359   PCO2ART 42 03/15/2022 2359   PO2ART 97 03/15/2022 2359   HCO3 26.0 03/15/2022 2359   ACIDBASEDEF 0.8 03/15/2022 1512   O2SAT 98.6 03/15/2022 2359     Coagulation Profile: Recent Labs  Lab 03/15/22 1519 03/16/22 0703  INR 1.6* 1.4*    Cardiac Enzymes: Recent Labs  Lab 03/15/22 1519  CKTOTAL 212    HbA1C: Hgb A1c MFr Bld  Date/Time Value Ref Range Status  08/05/2021 04:27 AM 10.2 (H) 4.8 - 5.6 % Final    Comment:    (NOTE) Pre diabetes:          5.7%-6.4%  Diabetes:              >6.4%  Glycemic control for   <7.0% adults with diabetes   05/09/2021 07:20 AM 9.2 (H) 4.8 - 5.6 % Final    Comment:    (NOTE) Pre diabetes:          5.7%-6.4%  Diabetes:              >6.4%  Glycemic control for   <7.0% adults with diabetes     CBG: Recent Labs  Lab 03/16/22 0406 03/16/22 0452 03/16/22 0612 03/16/22 0747 03/16/22 0954  GLUCAP 214* 197* 198* 180* 176*    RADIOLOGY CLINICAL DATA:  66 year old male with history of sepsis. Suspected pyelonephritis.   EXAM: CT ABDOMEN AND PELVIS WITHOUT CONTRAST   TECHNIQUE: Multidetector CT imaging of the abdomen and pelvis was performed following the standard protocol without IV contrast.   RADIATION DOSE REDUCTION: This exam was performed according to the departmental dose-optimization program which includes automated exposure control, adjustment of the mA and/or kV according to patient size and/or use of iterative reconstruction technique.   COMPARISON:  No priors.   FINDINGS: Lower chest: Small right pleural effusion with passive subsegmental atelectasis in the dependent portions of the right lower lobe. Cardiomegaly. Calcifications of the mitral annulus and mitral subvalvular apparatus. Atherosclerotic calcifications in the right coronary artery.   Hepatobiliary: Liver has a nodular contour, suggesting  underlying cirrhosis. There is also heterogeneous low attenuation throughout the hepatic parenchyma, indicative of a background of hepatic steatosis. No discrete cystic or solid hepatic lesions are confidently identified upon today's noncontrast CT examination. A small amount of amorphous increased attenuation is noted lying dependently in the gallbladder, likely to reflect biliary sludge and/or tiny noncalcified gallstones. Gallbladder is not distended. No surrounding inflammatory changes to suggest an acute cholecystitis at this time.   Pancreas: No definite pancreatic mass or peripancreatic fluid collections or inflammatory changes are noted on today's noncontrast CT examination.   Spleen: Unremarkable.   Adrenals/Urinary  Tract: There is a large amount of high attenuation material adjacent to the right kidney, much of which appears confined within Gerota's fascia and is compatible with a large subcapsular hematoma, measuring up to 4.8 x 9.0 x 13.8 cm (axial image 36 of series 2 and sagittal image 56 of series 6). This compresses the adjacent kidney, displacing it slightly anteriorly. In addition, there is a large amount of gas in the inferolateral aspect of the right kidney extending into the adjacent subcapsular hematoma. In the posterolateral aspect of the interpolar region of the left kidney (axial image 39 of series 2) there is an exophytic 1.6 cm low-attenuation lesion, incompletely characterized on today's noncontrast CT examination, but statistically likely to represent cysts. No hydroureteronephrosis. Urinary bladder is largely decompressed, but otherwise unremarkable in appearance.   Stomach/Bowel: Unenhanced appearance of the stomach is normal. There is no pathologic dilatation of small bowel or colon. A few scattered colonic diverticuli are noted, without definite focal surrounding inflammatory changes to clearly indicate an acute diverticulitis at this time. Normal  appendix.   Vascular/Lymphatic: Extensive atherosclerosis of the abdominal aorta and pelvic vasculature. No definite lymphadenopathy noted in the abdomen or pelvis.   Reproductive: Prostate gland and seminal vesicles are grossly unremarkable in appearance.   Other: Large amount of high attenuation fluid in the right perinephric space extending caudally throughout the retroperitoneum dissecting all the way down to the low anatomic pelvis and right pelvic sidewall. Small amount of adjacent intermediate to high attenuation fluid in the right pericolic gutter as well. Small volume of left perinephric fluid, low-attenuation. No pneumoperitoneum.   Musculoskeletal: There are no aggressive appearing lytic or blastic lesions noted in the visualized portions of the skeleton.   IMPRESSION: 1. Large subcapsular hematoma associated with the right kidney causing compression of the right kidney; clinical correlation for signs and symptoms of Page kidney is recommended. 2. Notably, there is a large amount of gas in the inferolateral aspect of the right kidney, extending into the overlying subcapsular hematoma. Findings are concerning for emphysematous pyelonephritis. Correlation with urinalysis is recommended. 3. Hemorrhage from the right subcapsular hematoma has extended outward into the right retroperitoneum and adjacent peritoneal spaces, as above. 4. Small amount of intermediate attenuation material lying dependently in the gallbladder, likely to reflect biliary sludge and/or small gallstones. No findings to suggest an acute cholecystitis at this time. 5. Hepatic steatosis with evidence of hepatic cirrhosis, as above. Colonic diverticulosis without evidence of acute diverticulitis at this time. 6. Aortic atherosclerosis, as well as right coronary artery disease. 7. Small right pleural effusion with areas of dependent atelectasis in the right lung base. 8. Additional incidental findings,  as above.        Review of Systems:   Unable to obtain.  Past Medical History:  He,  has a past medical history of Arrhythmia, Asthma, CHF (congestive heart failure) ([place]), COPD (chronic obstructive pulmonary disease) ([place]), Coronary artery disease, Depression, Diabetes mellitus without complication ([place]), Gout, History anabolic steroid use, Hyperlipidemia, Hypertension, Hypogonadism in male, Morbid obesity ([place]), Myocardial infarction [facility]), Peripheral vascular disease ([place]), Perirectal abscess, Pleurisy, Sleep apnea, and Varicella.   Surgical History:   Past Surgical History:  Procedure Laterality Date   ABDOMINAL AORTIC ANEURYSM REPAIR     ACHILLES TENDON SURGERY Left 01/10/2021   Procedure: ACHILLES LENGTHENING/KIDNER;  Surgeon: Caroline More, DPM;  Location: [facility] ORS;  Service: Podiatry;  Laterality: Left;   AMPUTATION Left 10/03/2021   Procedure: AMPUTATION BELOW KNEE;  Surgeon: Katha Cabal, MD;  Location: [facility];  Service: Vascular;  Laterality: Left;   AMPUTATION TOE Right 02/10/2016   Procedure: AMPUTATION TOE 3RD TOE;  Surgeon: Samara Deist, DPM;  Location: [facility];  Service: Podiatry;  Laterality: Right;   AMPUTATION TOE Left 02/24/2020   Procedure: AMPUTATION TOE;  Surgeon: Caroline More, DPM;  Location: [facility];  Service: Podiatry;  Laterality: Left;   APPLICATION OF WOUND VAC Left 02/29/2020   Procedure: APPLICATION OF WOUND VAC;  Surgeon: Caroline More, DPM;  Location: [facility];  Service: Podiatry;  Laterality: Left;   CARDIOVERSION [name] 02/13/2022   Procedure: CARDIOVERSION;  Surgeon: Corey Skains, MD;  Location: [facility];  Service: Cardiovascular;  Laterality: [name];   COLONOSCOPY WITH PROPOFOL [name] 11/18/2015   Procedure: COLONOSCOPY WITH PROPOFOL;  Surgeon: Manya Silvas, MD;  Location: [facility] ENDOSCOPY;  Service: Endoscopy;  Laterality: [name];   CORONARY ARTERY BYPASS GRAFT     CORONARY STENT INTERVENTION [name] 02/02/2020   Procedure: CORONARY STENT  INTERVENTION;  Surgeon: Isaias Cowman, MD;  Location: [place] [facility];  Service: Cardiovascular;  Laterality: [name];   INCISION AND DRAINAGE Left 08/07/2021   Procedure: INCISION AND DRAINAGE-Partial Calcanectomy;  Surgeon: Caroline More, DPM;  Location: [facility];  Service: Podiatry;  Laterality: Left;   IRRIGATION AND DEBRIDEMENT FOOT Left 02/29/2020   Procedure: IRRIGATION AND DEBRIDEMENT FOOT;  Surgeon: Caroline More, DPM;  Location: [facility];  Service: Podiatry;  Laterality: Left;   IRRIGATION AND DEBRIDEMENT FOOT Left 02/24/2020   Procedure: IRRIGATION AND DEBRIDEMENT FOOT;  Surgeon: Caroline More, DPM;  Location: [facility];  Service: Podiatry;  Laterality: Left;   KNEE ARTHROSCOPY     LEFT HEART CATH AND CORS/GRAFTS ANGIOGRAPHY [name] 02/02/2020   Procedure: LEFT HEART CATH AND CORS/GRAFTS ANGIOGRAPHY;  Surgeon: Teodoro Spray, MD;  Location: [place] [facility];  Service: Cardiovascular;  Laterality: [name];   LOWER EXTREMITY ANGIOGRAPHY Left 02/25/2020   Procedure: Lower Extremity Angiography;  Surgeon: Algernon Huxley, MD;  Location: [place] [facility];  Service: Cardiovascular;  Laterality: Left;   LOWER EXTREMITY ANGIOGRAPHY Left 01/04/2021   Procedure: LOWER EXTREMITY ANGIOGRAPHY;  Surgeon: Algernon Huxley, MD;  Location: [place] [facility];  Service: Cardiovascular;  Laterality: Left;   METATARSAL HEAD EXCISION Left 01/10/2021   Procedure: METATARSAL HEAD EXCISION - LEFT 5th;  Surgeon: Caroline More, DPM;  Location: [facility];  Service: Podiatry;  Laterality: Left;   PERIPHERAL VASCULAR CATHETERIZATION Right 01/24/2016   Procedure: Lower Extremity Angiography;  Surgeon: Katha Cabal, MD;  Location: [place] [facility];  Service: Cardiovascular;  Laterality: Right;   PERIPHERAL VASCULAR CATHETERIZATION Right 01/25/2016   Procedure: Lower Extremity Angiography;  Surgeon: Katha Cabal, MD;  Location: [place] [facility];  Service: Cardiovascular;  Laterality: Right;   TOE  AMPUTATION     TONSILLECTOMY       Social History:   reports that he quit smoking about 6 years ago. His smoking use included cigarettes. He has a 22.50 pack-year smoking history. He has never used smokeless tobacco. He reports that he does not currently use alcohol after a past usage of about 3.0 standard drinks of alcohol per week. He reports that he does not use drugs.   Family History:  His family history includes Alcohol abuse in his father; Coronary artery disease in his father; Heart failure in his brother; Hypertension in his father.   Allergies Allergies  Allergen Reactions   Penicillins Other (See Comments)    Happened at 66 years old  and pt. stated he passed out  He has tolerated amoxicillin/clavulanate and ampicillin/sulbactam   Statins     Other reaction(s): Muscle Pain Causes legs to ache per pt   Metformin And Related Diarrhea     Home Medications  Prior to Admission medications   Medication Sig Start Date End Date Taking? Authorizing Provider  amiodarone (PACERONE) 200 MG tablet Take 200 mg by mouth daily.   Yes [provider]  apixaban (ELIQUIS) 5 MG TABS tablet Take 1 tablet (5 mg total) by mouth 2 (two) times daily. 01/26/16  Yes Stegmayer, Janalyn Harder, PA-C  aspirin EC 81 MG EC tablet Take 1 tablet (81 mg total) by mouth daily. Swallow whole. 05/17/21  Yes Dessa Phi, DO  budesonide (PULMICORT) 0.5 MG/2ML nebulizer solution Take 2 mLs (0.5 mg total) by nebulization 2 (two) times daily. 11/10/21  Yes Emeterio Reeve, DO  Cholecalciferol 25 MCG (1000 UT) tablet Take 1,000 Units by mouth daily.   Yes [provider]  Cyanocobalamin (VITAMIN B-12) 5000 MCG TBDP Take 10,000 mcg by mouth daily.   Yes [provider]  diltiazem (CARDIZEM SR) 120 MG 12 hr capsule Take 1 capsule (120 mg total) by mouth every 12 (twelve) hours. 11/10/21  Yes Emeterio Reeve, DO  ezetimibe (ZETIA) 10 MG tablet Take 10 mg by mouth at bedtime.   Yes [provider]  ferrous sulfate 325 (65 FE) MG tablet Take 325 mg by mouth daily with breakfast.   Yes [provider]  finasteride (PROSCAR) 5 MG tablet Take 1 tablet (5 mg total) by mouth daily. 11/11/21  Yes Emeterio Reeve, DO  furosemide (LASIX) 20 MG tablet Take 2 tablets (40 mg total) by mouth daily. 02/15/22  Yes Fritzi Mandes, MD  HUMALOG 100 UNIT/ML injection Inject 15 Units into the skin 3 (three) times daily with meals. >200 02/05/22  Yes [provider]  insulin glargine-yfgn (SEMGLEE) 100 UNIT/ML injection Inject 0.25 mLs (25 Units total) into the skin at bedtime. Patient taking differently: Inject 0-25 Units into the skin at bedtime. 11/10/21  Yes Emeterio Reeve, DO  levothyroxine (SYNTHROID) 25 MCG tablet Take 1 tablet (25 mcg total) by mouth daily at 6 (six) AM. 11/11/21  Yes Emeterio Reeve, DO  Multiple Vitamins-Minerals (MULTIVITAMIN WITH MINERALS) tablet Take 1 tablet by mouth daily.   Yes [provider]  nitrofurantoin, macrocrystal-monohydrate, (MACROBID) 100 MG capsule Take 100 mg by mouth daily. 03/13/22 06/11/22 Yes [provider]  nystatin (MYCOSTATIN/NYSTOP) powder Apply topically 2 (two) times daily. 11/10/21  Yes Emeterio Reeve, DO  primidone (MYSOLINE) 250 MG tablet Take 250 mg by mouth 2 (two) times daily. 01/23/20  Yes [provider]  risperiDONE (RISPERDAL) 0.5 MG tablet Take 1 tablet (0.5 mg total) by mouth 2 (two) times daily. 11/10/21  Yes Emeterio Reeve, DO  tamsulosin (FLOMAX) 0.4 MG CAPS capsule Take 0.4 mg by mouth daily.   Yes [provider]  vitamin C (ASCORBIC ACID) 500 MG tablet Take 500 mg by mouth daily.   Yes [provider]  acetaminophen (TYLENOL) 325 MG tablet Take 2 tablets (650 mg total) by mouth every 6 (six) hours as needed for mild pain (or Fever >/= 101). 11/10/21   Emeterio Reeve, DO  insulin aspart (NOVOLOG) 100 UNIT/ML injection Inject 0-20 Units into the skin 3  (three) times daily with meals. Patient not taking: Reported on 03/15/2022 11/10/21   Emeterio Reeve, DO  metFORMIN (GLUCOPHAGE) 1000 MG tablet Take 1,000 mg by mouth 2 (two)  times daily.  Patient not taking: Reported on 03/15/2022 12/02/17   [provider]  traMADol (ULTRAM) 50 MG tablet Take 1 tablet (50 mg total) by mouth daily as needed for moderate pain. 08/11/21   Fritzi Mandes, MD     Critical care time:  I have personally spent ____60______ minutes of critical care time, exclusive of time spent on any  procedures, in evaluation and management of this critically ill patient's condition.    Tyanne Derocher MD ([place]) FOR [facility]

## 2022-03-16 NOTE — Inpatient Diabetes Management (Signed)
Inpatient Diabetes Program Recommendations  AACE/ADA: New Consensus Statement on Inpatient Glycemic Control (2015)  Target Ranges:  Prepandial:   less than 140 mg/dL      Peak postprandial:   less than 180 mg/dL (1-2 hours)      Critically ill patients:  140 - 180 mg/dL   Lab Results  Component Value Date   GLUCAP 203 (H) 03/16/2022   HGBA1C 10.2 (H) 08/05/2021    Review of Glycemic Control  Diabetes history: DM2 Outpatient Diabetes medications: Semglee 0-25 QHS, Novolog 0-20 units TID (not taking), Humalog 15 units TID and Metformin 1000 mg BID (not taking) Current orders for Inpatient glycemic control: IV insulin, Novolog 0-9 units Q4H  Inpatient Diabetes Program Recommendations:    Appears he is transitioning from IV insulin to Novolog 0-9 units Q4H.  Given his IV insulin drip rates, might consider adding Levemir 15 units BID.  Will continue to follow while inpatient.  Thank you, Reche Dixon, MSN, [place] Diabetes Coordinator Inpatient Diabetes Program [phone] (team pager from 8a-5p)

## 2022-03-16 NOTE — Progress Notes (Signed)
Patient has had an eventful day, at the beginning of the shift patient's B/P continued to fluctuate with multiple lows, A-line was placed by Dr. Dillard Cannon at 0900 after consent obtained and time-out performed, patient tolerated procedure well.  Patient was able to transition off of his insulin gtt and per MD no long acting insulin to be given before hand just recheck CBG, administer sliding scale ordered, and turn off insulin gtt. Patient's B/P noted to still be low despite increases in Levo, which was changed to quad strength.  NP notified and ordered vaso.  Patient has been resting throughout the shift, will continue to monitor.

## 2022-03-16 NOTE — Consult Note (Signed)
Urology Consult  I have been asked to see the patient by Dr. Reesa Chew, for evaluation and management of right subcapsular hematoma.  Chief Complaint: Hyperglycemia, AMS  History of Present Illness: Alec Renne. is a 66 y.o. year old male with PMH A-fib on Eliquis, CHF, COPD, CAD, DM 2, MI, peripheral vascular disease s/p left BKA and multiple right toe amputations, and OSA admitted in the setting of multiple recent falls with concerns for sepsis of urinary source.  CTAP without contrast this morning revealed a large right renal subcapsular hematoma extending into the right retroperitoneum and peritoneal spaces as well as a large volume of gas within the right kidney consistent with emphysematous pyelonephritis.  Admission labs notable for creatinine 2.34, baseline 1.0; hemoglobin 8.8, 10.9 one month prior; WBC count 11.4; UA with no nitrites, 6-10 RBCs/hpf, >50 WBCs/hpf, many bacteria, WBC clumps, and non-squamous epithelial cells; and lactate 2.2.  Blood cultures are growing Klebsiella pneumoniae, susceptibilities to follow.  Urine culture pending.  On antibiotics as below.  Last estimated Eliquis is sometime yesterday, timing unclear. He is in the ICU on pressors. He is sleeping and unable to contribute to HPI; wife is not at the bedside. He is spontaneously voiding dark yellow urine.  Anti-infectives (From admission, onward)    Start     Dose/Rate Route Frequency Ordered Stop   03/16/22 1200  cefTRIAXone (ROCEPHIN) 2 g in sodium chloride 0.9 % 100 mL IVPB        2 g 200 mL/hr over 30 Minutes Intravenous Every 24 hours 03/16/22 0507     03/16/22 0300  ceFEPIme (MAXIPIME) 2 g in sodium chloride 0.9 % 100 mL IVPB  Status:  Discontinued        2 g 200 mL/hr over 30 Minutes Intravenous Every 12 hours 03/15/22 2030 03/16/22 0505   03/15/22 2200  ceFEPIme (MAXIPIME) 1 g in sodium chloride 0.9 % 100 mL IVPB  Status:  Discontinued        1 g 200 mL/hr over 30 Minutes Intravenous Every  8 hours 03/15/22 2011 03/15/22 2029   03/15/22 2200  vancomycin variable dose per unstable renal function (pharmacist dosing)         Does not apply See admin instructions 03/15/22 2032     03/15/22 1630  vancomycin (VANCOREADY) IVPB 1500 mg/300 mL       See Hyperspace for full Linked Orders Report.   1,500 mg 150 mL/hr over 120 Minutes Intravenous  Once 03/15/22 1526 03/15/22 2104   03/15/22 1530  vancomycin (VANCOCIN) IVPB 1000 mg/200 mL premix       See Hyperspace for full Linked Orders Report.   1,000 mg 200 mL/hr over 60 Minutes Intravenous  Once 03/15/22 1526 03/15/22 1736   03/15/22 1515  ceFEPIme (MAXIPIME) 2 g in sodium chloride 0.9 % 100 mL IVPB        2 g 200 mL/hr over 30 Minutes Intravenous  Once 03/15/22 1512 03/15/22 1614   03/15/22 1515  vancomycin (VANCOCIN) IVPB 1000 mg/200 mL premix  Status:  Discontinued        1,000 mg 200 mL/hr over 60 Minutes Intravenous  Once 03/15/22 1512 03/15/22 1524        Past Medical History:  Diagnosis Date   Arrhythmia    atrial fibrillation   Asthma    CHF (congestive heart failure) (HCC)    COPD (chronic obstructive pulmonary disease) ([place])    Coronary artery disease    Depression  Diabetes mellitus without complication ([place])    Gout    History anabolic steroid use    Hyperlipidemia    Hypertension    Hypogonadism in male    Morbid obesity ([place])    Myocardial infarction ([place])    Peripheral vascular disease ([place])    Perirectal abscess    Pleurisy    Sleep apnea    CPAP at night, no oxygen   Varicella     Past Surgical History:  Procedure Laterality Date   ABDOMINAL AORTIC ANEURYSM REPAIR     ACHILLES TENDON SURGERY Left 01/10/2021   Procedure: ACHILLES LENGTHENING/KIDNER;  Surgeon: Caroline More, DPM;  Location: [facility];  Service: Podiatry;  Laterality: Left;   AMPUTATION Left 10/03/2021   Procedure: AMPUTATION BELOW KNEE;  Surgeon: Katha Cabal, MD;  Location: [facility];  Service: Vascular;  Laterality: Left;    AMPUTATION TOE Right 02/10/2016   Procedure: AMPUTATION TOE 3RD TOE;  Surgeon: Samara Deist, DPM;  Location: [facility];  Service: Podiatry;  Laterality: Right;   AMPUTATION TOE Left 02/24/2020   Procedure: AMPUTATION TOE;  Surgeon: Caroline More, DPM;  Location: [facility];  Service: Podiatry;  Laterality: Left;   APPLICATION OF WOUND VAC Left 02/29/2020   Procedure: APPLICATION OF WOUND VAC;  Surgeon: Caroline More, DPM;  Location: [facility];  Service: Podiatry;  Laterality: Left;   CARDIOVERSION [name] 02/13/2022   Procedure: CARDIOVERSION;  Surgeon: Corey Skains, MD;  Location: [facility];  Service: Cardiovascular;  Laterality: [name];   COLONOSCOPY WITH PROPOFOL [name] 11/18/2015   Procedure: COLONOSCOPY WITH PROPOFOL;  Surgeon: Manya Silvas, MD;  Location: [facility];  Service: Endoscopy;  Laterality: [name];   CORONARY ARTERY BYPASS GRAFT     CORONARY STENT INTERVENTION [name] 02/02/2020   Procedure: CORONARY STENT INTERVENTION;  Surgeon: Isaias Cowman, MD;  Location: [place] [facility];  Service: Cardiovascular;  Laterality: [name];   INCISION AND DRAINAGE Left 08/07/2021   Procedure: INCISION AND DRAINAGE-Partial Calcanectomy;  Surgeon: Caroline More, DPM;  Location: [facility];  Service: Podiatry;  Laterality: Left;   IRRIGATION AND DEBRIDEMENT FOOT Left 02/29/2020   Procedure: IRRIGATION AND DEBRIDEMENT FOOT;  Surgeon: Caroline More, DPM;  Location: [facility];  Service: Podiatry;  Laterality: Left;   IRRIGATION AND DEBRIDEMENT FOOT Left 02/24/2020   Procedure: IRRIGATION AND DEBRIDEMENT FOOT;  Surgeon: Caroline More, DPM;  Location: [facility];  Service: Podiatry;  Laterality: Left;   KNEE ARTHROSCOPY     LEFT HEART CATH AND CORS/GRAFTS ANGIOGRAPHY [name] 02/02/2020   Procedure: LEFT HEART CATH AND CORS/GRAFTS ANGIOGRAPHY;  Surgeon: Teodoro Spray, MD;  Location: [place] [facility];  Service: Cardiovascular;  Laterality: [name];   LOWER EXTREMITY ANGIOGRAPHY Left 02/25/2020   Procedure: Lower  Extremity Angiography;  Surgeon: Algernon Huxley, MD;  Location: [place] [facility];  Service: Cardiovascular;  Laterality: Left;   LOWER EXTREMITY ANGIOGRAPHY Left 01/04/2021   Procedure: LOWER EXTREMITY ANGIOGRAPHY;  Surgeon: Algernon Huxley, MD;  Location: [place] [facility];  Service: Cardiovascular;  Laterality: Left;   METATARSAL HEAD EXCISION Left 01/10/2021   Procedure: METATARSAL HEAD EXCISION - LEFT 5th;  Surgeon: Caroline More, DPM;  Location: [facility];  Service: Podiatry;  Laterality: Left;   PERIPHERAL VASCULAR CATHETERIZATION Right 01/24/2016   Procedure: Lower Extremity Angiography;  Surgeon: Katha Cabal, MD;  Location: [place] [facility];  Service: Cardiovascular;  Laterality: Right;   PERIPHERAL VASCULAR CATHETERIZATION Right 01/25/2016   Procedure: Lower Extremity Angiography;  Surgeon: Katha Cabal, MD;  Location: [place] [facility];  Service: Cardiovascular;  Laterality: Right;   TOE AMPUTATION     TONSILLECTOMY      Home Medications:  Current Meds  Medication Sig   amiodarone (PACERONE) 200 MG tablet Take 200 mg by mouth daily.   apixaban (ELIQUIS) 5 MG TABS tablet Take 1 tablet (5 mg total) by mouth 2 (two) times daily.   aspirin EC 81 MG EC tablet Take 1 tablet (81 mg total) by mouth daily. Swallow whole.   budesonide (PULMICORT) 0.5 MG/2ML nebulizer solution Take 2 mLs (0.5 mg total) by nebulization 2 (two) times daily.   Cholecalciferol 25 MCG (1000 UT) tablet Take 1,000 Units by mouth daily.   Cyanocobalamin (VITAMIN B-12) 5000 MCG TBDP Take 10,000 mcg by mouth daily.   diltiazem (CARDIZEM SR) 120 MG 12 hr capsule Take 1 capsule (120 mg total) by mouth every 12 (twelve) hours.   ezetimibe (ZETIA) 10 MG tablet Take 10 mg by mouth at bedtime.   ferrous sulfate 325 (65 FE) MG tablet Take 325 mg by mouth daily with breakfast.   finasteride (PROSCAR) 5 MG tablet Take 1 tablet (5 mg total) by mouth daily.   furosemide (LASIX) 20 MG tablet Take 2 tablets (40 mg  total) by mouth daily.   HUMALOG 100 UNIT/ML injection Inject 15 Units into the skin 3 (three) times daily with meals. >200   insulin glargine-yfgn (SEMGLEE) 100 UNIT/ML injection Inject 0.25 mLs (25 Units total) into the skin at bedtime. (Patient taking differently: Inject 0-25 Units into the skin at bedtime.)   levothyroxine (SYNTHROID) 25 MCG tablet Take 1 tablet (25 mcg total) by mouth daily at 6 (six) AM.   Multiple Vitamins-Minerals (MULTIVITAMIN WITH MINERALS) tablet Take 1 tablet by mouth daily.   nitrofurantoin, macrocrystal-monohydrate, (MACROBID) 100 MG capsule Take 100 mg by mouth daily.   nystatin (MYCOSTATIN/NYSTOP) powder Apply topically 2 (two) times daily.   primidone (MYSOLINE) 250 MG tablet Take 250 mg by mouth 2 (two) times daily.   risperiDONE (RISPERDAL) 0.5 MG tablet Take 1 tablet (0.5 mg total) by mouth 2 (two) times daily.   tamsulosin (FLOMAX) 0.4 MG CAPS capsule Take 0.4 mg by mouth daily.   vitamin C (ASCORBIC ACID) 500 MG tablet Take 500 mg by mouth daily.    Allergies:  Allergies  Allergen Reactions   Penicillins Other (See Comments)    Happened at 66 years old and pt. stated he passed out  He has tolerated amoxicillin/clavulanate and ampicillin/sulbactam   Statins     Other reaction(s): Muscle Pain Causes legs to ache per pt   Metformin And Related Diarrhea    Family History  Problem Relation Age of Onset   Hypertension Father    Coronary artery disease Father    Alcohol abuse Father    Heart failure Brother     Social History:  reports that he quit smoking about 6 years ago. His smoking use included cigarettes. He has a 22.50 pack-year smoking history. He has never used smokeless tobacco. He reports that he does not currently use alcohol after a past usage of about 3.0 standard drinks of alcohol per week. He reports that he does not use drugs.  ROS: A complete review of systems was performed.  All systems are negative except for pertinent findings as  noted.  Physical Exam:  Vital signs in last 24 hours: Temp:  [98 F (36.7 C)-98.6 F (37 C)] 98.6 F (37 C) (11/17 0100) Pulse Rate:  [94-116] 94 (11/17  0600) Resp:  [16-28] 22 (11/17 0600) BP: (67-122)/(38-69) 117/52 (11/17 0600) SpO2:  [93 %-100 %] 100 % (11/17 0600) FiO2 (%):  [36 %] 36 % (11/17 0600) Weight:  [146.9 kg] 146.9 kg (11/16 1448) Constitutional:  Asleep and minimally arousable, ill appearing HEENT: [place] AT, moist mucus membranes Cardiovascular: No clubbing, cyanosis Respiratory: Normal respiratory effort GU: No flank ecchymosis Skin: No rashes, bruises or suspicious lesions Lymph: No cervical or inguinal adenopathy Neurologic: Grossly intact, no focal deficits, moving all 4 extremities Psychiatric: Unable to assess  Laboratory Data:  Recent Labs    03/15/22 1519  WBC 11.4*  HGB 8.8*  HCT 27.1*   Recent Labs    03/15/22 2109 03/16/22 0257 03/16/22 0703  NA 127* 129* 129*  K 3.7 3.6 3.9  CL 92* 95* 95*  CO2 '24 24 24  '$ GLUCOSE 485* 224* 191*  BUN 50* 54* 55*  CREATININE 2.23* 2.29* 2.35*  CALCIUM 8.0* 8.0* 8.3*   Recent Labs    03/15/22 1519 03/16/22 0703  INR 1.6* 1.4*   Urinalysis    Component Value Date/Time   COLORURINE AMBER (A) 03/15/2022 1519   APPEARANCEUR TURBID (A) 03/15/2022 1519   LABSPEC 1.017 03/15/2022 1519   PHURINE 5.0 03/15/2022 1519   GLUCOSEU >=500 (A) 03/15/2022 1519   HGBUR LARGE (A) 03/15/2022 1519   BILIRUBINUR NEGATIVE 03/15/2022 1519   KETONESUR 5 (A) 03/15/2022 1519   PROTEINUR >=300 (A) 03/15/2022 1519   NITRITE NEGATIVE 03/15/2022 1519   LEUKOCYTESUR LARGE (A) 03/15/2022 1519   Results for orders placed or performed during the hospital encounter of 03/15/22  Resp Panel by RT-PCR (Flu A&B, Covid) Anterior Nasal Swab     Status: None   Collection Time: 03/15/22  3:19 PM   Specimen: Anterior Nasal Swab  Result Value Ref Range Status   SARS Coronavirus 2 by RT PCR NEGATIVE NEGATIVE Final    Comment:  (NOTE) SARS-CoV-2 target nucleic acids are NOT DETECTED.  The SARS-CoV-2 RNA is generally detectable in upper respiratory specimens during the acute phase of infection. The lowest concentration of SARS-CoV-2 viral copies this assay can detect is 138 copies/mL. A negative result does not preclude SARS-Cov-2 infection and should not be used as the sole basis for treatment or other patient management decisions. A negative result may occur with  improper specimen collection/handling, submission of specimen other than nasopharyngeal swab, presence of viral mutation(s) within the areas targeted by this assay, and inadequate number of viral copies(<138 copies/mL). A negative result must be combined with clinical observations, patient history, and epidemiological information. The expected result is Negative.  Fact Sheet for Patients:  EntrepreneurPulse.com.au  Fact Sheet for Healthcare Providers:  IncredibleEmployment.be  This test is no t yet approved or cleared by the [place] FDA and  has been authorized for detection and/or diagnosis of SARS-CoV-2 by FDA under an Emergency Use Authorization (EUA). This EUA will remain  in effect (meaning this test can be used) for the duration of the COVID-19 declaration under Section 564(b)(1) of the Act, 21 U.S.C.section 360bbb-3(b)(1), unless the authorization is terminated  or revoked sooner.       Influenza A by PCR NEGATIVE NEGATIVE Final   Influenza B by PCR NEGATIVE NEGATIVE Final    Comment: (NOTE) The Xpert Xpress SARS-CoV-2/FLU/RSV plus assay is intended as an aid in the diagnosis of influenza from Nasopharyngeal swab specimens and should not be used as a sole basis for treatment. Nasal washings and aspirates are unacceptable for Xpert Xpress SARS-CoV-2/FLU/RSV  testing.  Fact Sheet for Patients: EntrepreneurPulse.com.au  Fact Sheet for Healthcare  Providers: IncredibleEmployment.be  This test is not yet approved or cleared by the [place] FDA and has been authorized for detection and/or diagnosis of SARS-CoV-2 by FDA under an Emergency Use Authorization (EUA). This EUA will remain in effect (meaning this test can be used) for the duration of the COVID-19 declaration under Section 564(b)(1) of the Act, 21 U.S.C. section 360bbb-3(b)(1), unless the authorization is terminated or revoked.  Performed at [facility], [address] [place], [place] [address]   Blood Culture (routine x 2)     Status: None (Preliminary result)   Collection Time: 03/15/22  3:19 PM   Specimen: BLOOD LEFT HAND  Result Value Ref Range Status   Specimen Description BLOOD LEFT HAND  Final   Special Requests   Final    BOTTLES DRAWN AEROBIC AND ANAEROBIC Blood Culture adequate volume   Culture  Setup Time   Final    GRAM NEGATIVE RODS IN BOTH AEROBIC AND ANAEROBIC BOTTLES Organism ID to follow CRITICAL RESULT CALLED TO, READ BACK BY AND VERIFIED WITH: NATHAN BELUE 03/16/2022 AT 0408 SRR Performed at [facility], [address] [place], [place] [address]    Culture GRAM NEGATIVE RODS  Final   Report Status PENDING  Incomplete  Blood Culture ID Panel (Reflexed)     Status: Abnormal   Collection Time: 03/15/22  3:19 PM  Result Value Ref Range Status   Enterococcus faecalis NOT DETECTED NOT DETECTED Final   Enterococcus Faecium NOT DETECTED NOT DETECTED Final   Listeria monocytogenes NOT DETECTED NOT DETECTED Final   Staphylococcus species NOT DETECTED NOT DETECTED Final   Staphylococcus aureus (BCID) NOT DETECTED NOT DETECTED Final   Staphylococcus epidermidis NOT DETECTED NOT DETECTED Final   Staphylococcus lugdunensis NOT DETECTED NOT DETECTED Final   Streptococcus species NOT DETECTED NOT DETECTED Final   Streptococcus agalactiae NOT DETECTED NOT DETECTED Final   Streptococcus pneumoniae NOT DETECTED  NOT DETECTED Final   Streptococcus pyogenes NOT DETECTED NOT DETECTED Final   A.calcoaceticus-baumannii NOT DETECTED NOT DETECTED Final   Bacteroides fragilis NOT DETECTED NOT DETECTED Final   Enterobacterales DETECTED (A) NOT DETECTED Final    Comment: Enterobacterales represent a large order of gram negative bacteria, not a single organism. CRITICAL RESULT CALLED TO, READ BACK BY AND VERIFIED WITH: NATHAN BELUE 03/16/2022 AT 0408 SRR    Enterobacter cloacae complex NOT DETECTED NOT DETECTED Final   Escherichia coli NOT DETECTED NOT DETECTED Final   Klebsiella aerogenes NOT DETECTED NOT DETECTED Final   Klebsiella oxytoca NOT DETECTED NOT DETECTED Final   Klebsiella pneumoniae DETECTED (A) NOT DETECTED Final    Comment: CRITICAL RESULT CALLED TO, READ BACK BY AND VERIFIED WITH: NATHAN BELUE 03/16/2022 AT 0408 SRR    Proteus species NOT DETECTED NOT DETECTED Final   Salmonella species NOT DETECTED NOT DETECTED Final   Serratia marcescens NOT DETECTED NOT DETECTED Final   Haemophilus influenzae NOT DETECTED NOT DETECTED Final   Neisseria meningitidis NOT DETECTED NOT DETECTED Final   Pseudomonas aeruginosa NOT DETECTED NOT DETECTED Final   Stenotrophomonas maltophilia NOT DETECTED NOT DETECTED Final   Candida albicans NOT DETECTED NOT DETECTED Final   Candida auris NOT DETECTED NOT DETECTED Final   Candida glabrata NOT DETECTED NOT DETECTED Final   Candida krusei NOT DETECTED NOT DETECTED Final   Candida parapsilosis NOT DETECTED NOT DETECTED Final   Candida tropicalis NOT DETECTED NOT DETECTED Final  Cryptococcus neoformans/gattii NOT DETECTED NOT DETECTED Final   CTX-M ESBL NOT DETECTED NOT DETECTED Final   Carbapenem resistance IMP NOT DETECTED NOT DETECTED Final   Carbapenem resistance KPC NOT DETECTED NOT DETECTED Final   Carbapenem resistance NDM NOT DETECTED NOT DETECTED Final   Carbapenem resist OXA 48 LIKE NOT DETECTED NOT DETECTED Final   Carbapenem resistance VIM NOT  DETECTED NOT DETECTED Final    Comment: Performed at [facility], [address] [place], [place] [address]  Blood Culture (routine x 2)     Status: None (Preliminary result)   Collection Time: 03/15/22  9:09 PM   Specimen: BLOOD  Result Value Ref Range Status   Specimen Description BLOOD RIGHT HAND  Final   Special Requests   Final    BOTTLES DRAWN AEROBIC AND ANAEROBIC Blood Culture results may not be optimal due to an inadequate volume of blood received in culture bottles   Culture   Final    NO GROWTH < 12 HOURS Performed at [name], [address] [place], [place] [address]    Report Status PENDING  Incomplete  MRSA Next Gen by PCR, Nasal     Status: Abnormal   Collection Time: 03/15/22 10:02 PM   Specimen: Nasal Mucosa; Nasal Swab  Result Value Ref Range Status   MRSA by PCR Next Gen DETECTED (A) NOT DETECTED Final    Comment: RESULT CALLED TO, READ BACK BY AND VERIFIED WITH: Burnetta Sabin 03/15/2022 AT 2329 SRR (NOTE) The GeneXpert MRSA Assay (FDA approved for NASAL specimens only), is one component of a comprehensive MRSA colonization surveillance program. It is not intended to diagnose MRSA infection nor to guide or monitor treatment for MRSA infections. Test performance is not FDA approved in patients less than 62 years old. Performed at [facility], [address], [place], [place] [address]     Radiologic Imaging: CT ABDOMEN PELVIS WO CONTRAST  Result Date: 03/16/2022 CLINICAL DATA:  66 year old male with history of sepsis. Suspected pyelonephritis. EXAM: CT ABDOMEN AND PELVIS WITHOUT CONTRAST TECHNIQUE: Multidetector CT imaging of the abdomen and pelvis was performed following the standard protocol without IV contrast. RADIATION DOSE REDUCTION: This exam was performed according to the departmental dose-optimization program which includes automated exposure control, adjustment of the mA and/or kV according to patient size  and/or use of iterative reconstruction technique. COMPARISON:  No priors. FINDINGS: Lower chest: Small right pleural effusion with passive subsegmental atelectasis in the dependent portions of the right lower lobe. Cardiomegaly. Calcifications of the mitral annulus and mitral subvalvular apparatus. Atherosclerotic calcifications in the right coronary artery. Hepatobiliary: Liver has a nodular contour, suggesting underlying cirrhosis. There is also heterogeneous low attenuation throughout the hepatic parenchyma, indicative of a background of hepatic steatosis. No discrete cystic or solid hepatic lesions are confidently identified upon today's noncontrast CT examination. A small amount of amorphous increased attenuation is noted lying dependently in the gallbladder, likely to reflect biliary sludge and/or tiny noncalcified gallstones. Gallbladder is not distended. No surrounding inflammatory changes to suggest an acute cholecystitis at this time. Pancreas: No definite pancreatic mass or peripancreatic fluid collections or inflammatory changes are noted on today's noncontrast CT examination. Spleen: Unremarkable. Adrenals/Urinary Tract: There is a large amount of high attenuation material adjacent to the right kidney, much of which appears confined within Gerota's fascia and is compatible with a large subcapsular hematoma, measuring up to 4.8 x 9.0 x 13.8 cm (axial image 36 of series 2 and sagittal image 56 of  series 6). This compresses the adjacent kidney, displacing it slightly anteriorly. In addition, there is a large amount of gas in the inferolateral aspect of the right kidney extending into the adjacent subcapsular hematoma. In the posterolateral aspect of the interpolar region of the left kidney (axial image 39 of series 2) there is an exophytic 1.6 cm low-attenuation lesion, incompletely characterized on today's noncontrast CT examination, but statistically likely to represent cysts. No hydroureteronephrosis.  Urinary bladder is largely decompressed, but otherwise unremarkable in appearance. Stomach/Bowel: Unenhanced appearance of the stomach is normal. There is no pathologic dilatation of small bowel or colon. A few scattered colonic diverticuli are noted, without definite focal surrounding inflammatory changes to clearly indicate an acute diverticulitis at this time. Normal appendix. Vascular/Lymphatic: Extensive atherosclerosis of the abdominal aorta and pelvic vasculature. No definite lymphadenopathy noted in the abdomen or pelvis. Reproductive: Prostate gland and seminal vesicles are grossly unremarkable in appearance. Other: Large amount of high attenuation fluid in the right perinephric space extending caudally throughout the retroperitoneum dissecting all the way down to the low anatomic pelvis and right pelvic sidewall. Small amount of adjacent intermediate to high attenuation fluid in the right pericolic gutter as well. Small volume of left perinephric fluid, low-attenuation. No pneumoperitoneum. Musculoskeletal: There are no aggressive appearing lytic or blastic lesions noted in the visualized portions of the skeleton. IMPRESSION: 1. Large subcapsular hematoma associated with the right kidney causing compression of the right kidney; clinical correlation for signs and symptoms of Page kidney is recommended. 2. Notably, there is a large amount of gas in the inferolateral aspect of the right kidney, extending into the overlying subcapsular hematoma. Findings are concerning for emphysematous pyelonephritis. Correlation with urinalysis is recommended. 3. Hemorrhage from the right subcapsular hematoma has extended outward into the right retroperitoneum and adjacent peritoneal spaces, as above. 4. Small amount of intermediate attenuation material lying dependently in the gallbladder, likely to reflect biliary sludge and/or small gallstones. No findings to suggest an acute cholecystitis at this time. 5. Hepatic  steatosis with evidence of hepatic cirrhosis, as above. Colonic diverticulosis without evidence of acute diverticulitis at this time. 6. Aortic atherosclerosis, as well as right coronary artery disease. 7. Small right pleural effusion with areas of dependent atelectasis in the right lung base. 8. Additional incidental findings, as above. Electronically Signed   By: Vinnie Langton M.D.   On: 03/16/2022 06:18   DG Chest 1 View  Result Date: 03/16/2022 CLINICAL DATA:  Encounter for central line placement EXAM: CHEST  1 VIEW COMPARISON:  Yesterday FINDINGS: Left IJ line with tip at the SVC origin. No pneumothorax or new mediastinal widening. There is cardiomegaly and aortic tortuosity accentuated by rotation. Congested appearance of vessels. Prior CABG. IMPRESSION: 1. Left IJ line with tip near the upper SVC.  No pneumothorax. 2. Increased vascular congestion. Electronically Signed   By: Jorje Guild M.D.   On: 03/16/2022 05:36   US Abdomen Limited RUQ (LIVER/GB)  Result Date: 03/15/2022 CLINICAL DATA:  Elevated bilirubin. EXAM: ULTRASOUND ABDOMEN LIMITED RIGHT UPPER QUADRANT COMPARISON:  None Available. FINDINGS: Gallbladder: There is gallstone. No gallbladder wall thickening or pericholecystic fluid. Negative sonographic Murphy's sign. Common bile duct: Diameter: 4 mm Liver: The liver is unremarkable. Portal vein is patent on color Doppler imaging with normal direction of blood flow towards the liver. Other: None. IMPRESSION: Cholelithiasis without sonographic evidence of acute cholecystitis. Electronically Signed   By: Anner Crete M.D.   On: 03/15/2022 21:16   DG Chest [facility]  Result Date: 03/15/2022 CLINICAL DATA:  Questionable sepsis. Evaluate for abnormality. Hyperglycemia. EXAM: PORTABLE CHEST 1 VIEW COMPARISON:  AP chest 02/13/2022, 10/24/2021 FINDINGS: Status post median sternotomy. Cardiac silhouette is again moderately enlarged. Tortuous thoracic aorta is similar to prior.  Improved aeration of bilateral lungs with apparent resolution of the prior interstitial thickening. No focal airspace opacity to indicate pneumonia. No pleural effusion or pneumothorax. Moderate multilevel disc space narrowing and bridging osteophytes of the thoracic spine. IMPRESSION: Stable moderate cardiomegaly. Improved aeration of bilateral lungs with apparent resolution of the prior interstitial pulmonary edema. No focal airspace opacity to indicate pneumonia. Electronically Signed   By: Yvonne Kendall M.D.   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  Hematoma likely multifactorial with contributors including emphysematous pyelitis, Eliquis use, and recent falls.  It is unclear to what degree he continues to actively bleed and we recommend serial H&H for this.  If his blood counts continue to drop, will recommend CT angio abdomen pelvis for consideration of possible right renal embolization given the severity of his acute illness.  We have asked IR to see him for consideration of possible percutaneous drainage, appreciate their input.  May consider right ureteral stent placement for source control of his urinary infection, however in the absence of obstruction it is unclear if this would have significant benefit for him.  Additionally, we are concerned about the risks of proceeding to the OR in light of the severity of his current illness.  I will return to the bedside at midday today and discuss further with the patient and his wife.  Agree with antibiotics for management of right emphysematous pyelitis.  Please continue to hold Eliquis.   Recommendations: -Continue antibiotics and supportive care -Serial H&H, possible CT angiogram if hemoglobin  continues to drop -Transfusion per primary team -IR consult as above -Consider right ureteral stent placement  Thank you for involving me in this patient's care, I will continue to follow along.  Debroah Loop, PA-C 03/16/2022 10:00 AM

## 2022-03-16 NOTE — Plan of Care (Signed)
Continuing with plan of care. 

## 2022-03-16 NOTE — Consult Note (Signed)
Chief Complaint: Patient was seen in consultation today for  Chief Complaint  Patient presents with   Hyperglycemia    Referring Physician(s): Debroah Loop, PA-C  Supervising Physician: Juliet Rude  Patient Status: [facility] - In-pt  History of Present Illness: [name]. is a 66 y.o. male with a medical history significant for atrial fibrillation on Eliquis, CHF, COPD, CAD, DM2, MI, OSA and peripheral vascular disease s/p left BKA and multiple right toe amputations. He presented to the ED 03/15/22 after falling multiple times with confusion and poor PO intake. ED work up was significant for septic shock secondary to UTI, hyperosmolar hyperglycemic state, acute kidney injury, anemia and elevated bilirubin. He was admitted to the stepdown unit and started on an insulin drip and pressors. CT imaging showed a large right renal subcapsular hematoma and other findings concerning for emphysematous pyelonephritis.   CT Abdomen/Pelvis 03/16/22 IMPRESSION: 1. Large subcapsular hematoma associated with the right kidney causing compression of the right kidney; clinical correlation for signs and symptoms of Page kidney is recommended. 2. Notably, there is a large amount of gas in the inferolateral aspect of the right kidney, extending into the overlying subcapsular hematoma. Findings are concerning for emphysematous pyelonephritis. Correlation with urinalysis is recommended. 3. Hemorrhage from the right subcapsular hematoma has extended outward into the right retroperitoneum and adjacent peritoneal spaces, as above. 4. Small amount of intermediate attenuation material lying dependently in the gallbladder, likely to reflect biliary sludge and/or small gallstones. No findings to suggest an acute cholecystitis at this time. 5. Hepatic steatosis with evidence of hepatic cirrhosis, as above. Colonic diverticulosis without evidence of acute diverticulitis at this time. 6.  Aortic atherosclerosis, as well as right coronary artery disease. 7. Small right pleural effusion with areas of dependent atelectasis in the right lung base. 8. Additional incidental findings, as above.   Interventional Radiology was asked to evaluate this patient for possible treatment/management options. The patient was evaluated at the bedside by Dr. Denna Haggard.    Past Medical History:  Diagnosis Date   Arrhythmia    atrial fibrillation   Asthma    CHF (congestive heart failure) (HCC)    COPD (chronic obstructive pulmonary disease) (HCC)    Coronary artery disease    Depression    Diabetes mellitus without complication (HCC)    Gout    History anabolic steroid use    Hyperlipidemia    Hypertension    Hypogonadism in male    Morbid obesity ([place])    Myocardial infarction ([place])    Peripheral vascular disease ([place])    Perirectal abscess    Pleurisy    Sleep apnea    CPAP at night, no oxygen   Varicella     Past Surgical History:  Procedure Laterality Date   ABDOMINAL AORTIC ANEURYSM REPAIR     ACHILLES TENDON SURGERY Left 01/10/2021   Procedure: ACHILLES LENGTHENING/KIDNER;  Surgeon: Caroline More, DPM;  Location: [facility] ORS;  Service: Podiatry;  Laterality: Left;   AMPUTATION Left 10/03/2021   Procedure: AMPUTATION BELOW KNEE;  Surgeon: Katha Cabal, MD;  Location: [facility] OR[facility];  Service: Vascular;  Laterality: Left;   AMPUTATION TOE Right 02/10/2016   Procedure: AMPUTATION TOE 3RD TOE;  Surgeon: Samara Deist, DPM;  Location: [facility];  Service: Podiatry;  Laterality: Right;   AMPUTATION TOE Left 02/24/2020   Procedure: AMPUTATION TOE;  Surgeon: Caroline More, DPM;  Location: [facility];  Service: Podiatry;  Laterality: Left;   APPLICATION OF WOUND VAC Left  02/29/2020   Procedure: APPLICATION OF WOUND VAC;  Surgeon: Caroline More, DPM;  Location: [facility];  Service: Podiatry;  Laterality: Left;   CARDIOVERSION [name] 02/13/2022   Procedure: CARDIOVERSION;  Surgeon: Corey Skains, MD;  Location: [facility];  Service: Cardiovascular;  Laterality: [name];   COLONOSCOPY WITH PROPOFOL [name] 11/18/2015   Procedure: COLONOSCOPY WITH PROPOFOL;  Surgeon: Manya Silvas, MD;  Location: [facility] ENDOSCOPY;  Service: Endoscopy;  Laterality: [name];   CORONARY ARTERY BYPASS GRAFT     CORONARY STENT INTERVENTION [name] 02/02/2020   Procedure: CORONARY STENT INTERVENTION;  Surgeon: Isaias Cowman, MD;  Location: [place] [facility];  Service: Cardiovascular;  Laterality: [name];   INCISION AND DRAINAGE Left 08/07/2021   Procedure: INCISION AND DRAINAGE-Partial Calcanectomy;  Surgeon: Caroline More, DPM;  Location: [facility];  Service: Podiatry;  Laterality: Left;   IRRIGATION AND DEBRIDEMENT FOOT Left 02/29/2020   Procedure: IRRIGATION AND DEBRIDEMENT FOOT;  Surgeon: Caroline More, DPM;  Location: [facility];  Service: Podiatry;  Laterality: Left;   IRRIGATION AND DEBRIDEMENT FOOT Left 02/24/2020   Procedure: IRRIGATION AND DEBRIDEMENT FOOT;  Surgeon: Caroline More, DPM;  Location: [facility];  Service: Podiatry;  Laterality: Left;   KNEE ARTHROSCOPY     LEFT HEART CATH AND CORS/GRAFTS ANGIOGRAPHY [name] 02/02/2020   Procedure: LEFT HEART CATH AND CORS/GRAFTS ANGIOGRAPHY;  Surgeon: Teodoro Spray, MD;  Location: [employer];  Service: Cardiovascular;  Laterality: [name];   LOWER EXTREMITY ANGIOGRAPHY Left 02/25/2020   Procedure: Lower Extremity Angiography;  Surgeon: Algernon Huxley, MD;  Location: [place] [facility];  Service: Cardiovascular;  Laterality: Left;   LOWER EXTREMITY ANGIOGRAPHY Left 01/04/2021   Procedure: LOWER EXTREMITY ANGIOGRAPHY;  Surgeon: Algernon Huxley, MD;  Location: [place] [facility];  Service: Cardiovascular;  Laterality: Left;   METATARSAL HEAD EXCISION Left 01/10/2021   Procedure: METATARSAL HEAD EXCISION - LEFT 5th;  Surgeon: Caroline More, DPM;  Location: [facility];  Service: Podiatry;  Laterality: Left;   PERIPHERAL VASCULAR CATHETERIZATION Right 01/24/2016   Procedure:  Lower Extremity Angiography;  Surgeon: Katha Cabal, MD;  Location: [place] [facility];  Service: Cardiovascular;  Laterality: Right;   PERIPHERAL VASCULAR CATHETERIZATION Right 01/25/2016   Procedure: Lower Extremity Angiography;  Surgeon: Katha Cabal, MD;  Location: [place] [facility];  Service: Cardiovascular;  Laterality: Right;   TOE AMPUTATION     TONSILLECTOMY      Allergies: Penicillins, Statins, and Metformin and related  Medications: Prior to Admission medications   Medication Sig Start Date End Date Taking? Authorizing Provider  amiodarone (PACERONE) 200 MG tablet Take 200 mg by mouth daily.   Yes [provider]  apixaban (ELIQUIS) 5 MG TABS tablet Take 1 tablet (5 mg total) by mouth 2 (two) times daily. 01/26/16  Yes Stegmayer, Janalyn Harder, PA-C  aspirin EC 81 MG EC tablet Take 1 tablet (81 mg total) by mouth daily. Swallow whole. 05/17/21  Yes Dessa Phi, DO  budesonide (PULMICORT) 0.5 MG/2ML nebulizer solution Take 2 mLs (0.5 mg total) by nebulization 2 (two) times daily. 11/10/21  Yes Emeterio Reeve, DO  Cholecalciferol 25 MCG (1000 UT) tablet Take 1,000 Units by mouth daily.   Yes [provider]  Cyanocobalamin (VITAMIN B-12) 5000 MCG TBDP Take 10,000 mcg by mouth daily.   Yes [provider]  diltiazem (CARDIZEM SR) 120 MG 12 hr capsule Take 1 capsule (120 mg total) by mouth every 12 (twelve) hours. 11/10/21  Yes Emeterio Reeve, DO  ezetimibe (ZETIA) 10 MG tablet Take 10 mg by mouth at bedtime.   Yes [provider]  ferrous sulfate 325 (65 FE) MG tablet Take 325 mg by mouth daily with breakfast.   Yes [provider]  finasteride (PROSCAR) 5 MG tablet Take 1 tablet (5 mg total) by mouth daily. 11/11/21  Yes Emeterio Reeve, DO  furosemide (LASIX) 20 MG tablet Take 2 tablets (40 mg total) by mouth daily. 02/15/22  Yes Fritzi Mandes, MD  HUMALOG 100 UNIT/ML injection Inject 15 Units into the skin 3 (three)  times daily with meals. >200 02/05/22  Yes [provider]  insulin glargine-yfgn (SEMGLEE) 100 UNIT/ML injection Inject 0.25 mLs (25 Units total) into the skin at bedtime. Patient taking differently: Inject 0-25 Units into the skin at bedtime. 11/10/21  Yes Emeterio Reeve, DO  levothyroxine (SYNTHROID) 25 MCG tablet Take 1 tablet (25 mcg total) by mouth daily at 6 (six) AM. 11/11/21  Yes Emeterio Reeve, DO  Multiple Vitamins-Minerals (MULTIVITAMIN WITH MINERALS) tablet Take 1 tablet by mouth daily.   Yes [provider]  nitrofurantoin, macrocrystal-monohydrate, (MACROBID) 100 MG capsule Take 100 mg by mouth daily. 03/13/22 06/11/22 Yes [provider]  nystatin (MYCOSTATIN/NYSTOP) powder Apply topically 2 (two) times daily. 11/10/21  Yes Emeterio Reeve, DO  primidone (MYSOLINE) 250 MG tablet Take 250 mg by mouth 2 (two) times daily. 01/23/20  Yes [provider]  risperiDONE (RISPERDAL) 0.5 MG tablet Take 1 tablet (0.5 mg total) by mouth 2 (two) times daily. 11/10/21  Yes Emeterio Reeve, DO  tamsulosin (FLOMAX) 0.4 MG CAPS capsule Take 0.4 mg by mouth daily.   Yes [provider]  vitamin C (ASCORBIC ACID) 500 MG tablet Take 500 mg by mouth daily.   Yes [provider]  acetaminophen (TYLENOL) 325 MG tablet Take 2 tablets (650 mg total) by mouth every 6 (six) hours as needed for mild pain (or Fever >/= 101). 11/10/21   Emeterio Reeve, DO  insulin aspart (NOVOLOG) 100 UNIT/ML injection Inject 0-20 Units into the skin 3 (three) times daily with meals. Patient not taking: Reported on 03/15/2022 11/10/21   Emeterio Reeve, DO  metFORMIN (GLUCOPHAGE) 1000 MG tablet Take 1,000 mg by mouth 2 (two) times daily.  Patient not taking: Reported on 03/15/2022 12/02/17   [provider]  traMADol (ULTRAM) 50 MG tablet Take 1 tablet (50 mg total) by mouth daily as needed for moderate pain. 08/11/21   Fritzi Mandes, MD     Family History   Problem Relation Age of Onset   Hypertension Father    Coronary artery disease Father    Alcohol abuse Father    Heart failure Brother     Social History   Socioeconomic History   Marital status: Married    Spouse name: Not on file   Number of children: Not on file   Years of education: Not on file   Highest education level: Not on file  Occupational History   Not on file  Tobacco Use   Smoking status: Former    Packs/day: 0.50    Years: 45.00    Total pack years: 22.50    Types: Cigarettes    Quit date: 04/05/2015    Years since quitting: 6.9   Smokeless tobacco: Never  Vaping Use   Vaping Use: Every day  Substance and Sexual Activity   Alcohol use: Not Currently    Alcohol/week: 3.0 standard drinks of alcohol    Types: 3 Glasses of wine per  week   Drug use: No   Sexual activity: Not on file  Other Topics Concern   Not on file  Social History Narrative   Not on file   Social Determinants of Health   Financial Resource Strain: Not on file  Food Insecurity: No Food Insecurity (02/12/2022)   Hunger Vital Sign    Worried About Running Out of Food in the Last Year: Never true    Ran Out of Food in the Last Year: Never true  Transportation Needs: Unmet Transportation Needs (02/12/2022)   PRAPARE - Hydrologist (Medical): Yes    Lack of Transportation (Non-Medical): Yes  Physical Activity: Not on file  Stress: Not on file  Social Connections: Not on file    Review of Systems: A 12 point ROS discussed and pertinent positives are indicated in the HPI above.  All other systems are negative.  Review of Systems  Unable to perform ROS: Acuity of condition    Vital Signs: BP (!) 107/52   Pulse 92   Temp 99.6 F (37.6 C) (Oral)   Resp 19   Ht _0  (1.93 m)   Wt (!) 323 lb 13.7 oz (146.9 kg)   SpO2 94%   BMI 39.42 kg/m   Physical Exam Constitutional:      General: He is not in acute distress.    Appearance: He is  ill-appearing.  Cardiovascular:     Rate and Rhythm: Normal rate and regular rhythm.  Pulmonary:     Effort: Pulmonary effort is normal.  Abdominal:     Palpations: Abdomen is soft.     Tenderness: There is no abdominal tenderness.  Skin:    General: Skin is warm and dry.     Comments: Venous stasis ulcers on right lower extremity. Left BKA.   Neurological:     Mental Status: He is alert and oriented to person, place, and time.     Imaging: CT ABDOMEN PELVIS WO CONTRAST  Result Date: 03/16/2022 CLINICAL DATA:  66 year old male with history of sepsis. Suspected pyelonephritis. EXAM: CT ABDOMEN AND PELVIS WITHOUT CONTRAST TECHNIQUE: Multidetector CT imaging of the abdomen and pelvis was performed following the standard protocol without IV contrast. RADIATION DOSE REDUCTION: This exam was performed according to the departmental dose-optimization program which includes automated exposure control, adjustment of the mA and/or kV according to patient size and/or use of iterative reconstruction technique. COMPARISON:  No priors. FINDINGS: Lower chest: Small right pleural effusion with passive subsegmental atelectasis in the dependent portions of the right lower lobe. Cardiomegaly. Calcifications of the mitral annulus and mitral subvalvular apparatus. Atherosclerotic calcifications in the right coronary artery. Hepatobiliary: Liver has a nodular contour, suggesting underlying cirrhosis. There is also heterogeneous low attenuation throughout the hepatic parenchyma, indicative of a background of hepatic steatosis. No discrete cystic or solid hepatic lesions are confidently identified upon today's noncontrast CT examination. A small amount of amorphous increased attenuation is noted lying dependently in the gallbladder, likely to reflect biliary sludge and/or tiny noncalcified gallstones. Gallbladder is not distended. No surrounding inflammatory changes to suggest an acute cholecystitis at this time.  Pancreas: No definite pancreatic mass or peripancreatic fluid collections or inflammatory changes are noted on today's noncontrast CT examination. Spleen: Unremarkable. Adrenals/Urinary Tract: There is a large amount of high attenuation material adjacent to the right kidney, much of which appears confined within Gerota's fascia and is compatible with a large subcapsular hematoma, measuring up to 4.8 x 9.0 x 13.8 cm (  axial image 36 of series 2 and sagittal image 56 of series 6). This compresses the adjacent kidney, displacing it slightly anteriorly. In addition, there is a large amount of gas in the inferolateral aspect of the right kidney extending into the adjacent subcapsular hematoma. In the posterolateral aspect of the interpolar region of the left kidney (axial image 39 of series 2) there is an exophytic 1.6 cm low-attenuation lesion, incompletely characterized on today's noncontrast CT examination, but statistically likely to represent cysts. No hydroureteronephrosis. Urinary bladder is largely decompressed, but otherwise unremarkable in appearance. Stomach/Bowel: Unenhanced appearance of the stomach is normal. There is no pathologic dilatation of small bowel or colon. A few scattered colonic diverticuli are noted, without definite focal surrounding inflammatory changes to clearly indicate an acute diverticulitis at this time. Normal appendix. Vascular/Lymphatic: Extensive atherosclerosis of the abdominal aorta and pelvic vasculature. No definite lymphadenopathy noted in the abdomen or pelvis. Reproductive: Prostate gland and seminal vesicles are grossly unremarkable in appearance. Other: Large amount of high attenuation fluid in the right perinephric space extending caudally throughout the retroperitoneum dissecting all the way down to the low anatomic pelvis and right pelvic sidewall. Small amount of adjacent intermediate to high attenuation fluid in the right pericolic gutter as well. Small volume of left  perinephric fluid, low-attenuation. No pneumoperitoneum. Musculoskeletal: There are no aggressive appearing lytic or blastic lesions noted in the visualized portions of the skeleton. IMPRESSION: 1. Large subcapsular hematoma associated with the right kidney causing compression of the right kidney; clinical correlation for signs and symptoms of Page kidney is recommended. 2. Notably, there is a large amount of gas in the inferolateral aspect of the right kidney, extending into the overlying subcapsular hematoma. Findings are concerning for emphysematous pyelonephritis. Correlation with urinalysis is recommended. 3. Hemorrhage from the right subcapsular hematoma has extended outward into the right retroperitoneum and adjacent peritoneal spaces, as above. 4. Small amount of intermediate attenuation material lying dependently in the gallbladder, likely to reflect biliary sludge and/or small gallstones. No findings to suggest an acute cholecystitis at this time. 5. Hepatic steatosis with evidence of hepatic cirrhosis, as above. Colonic diverticulosis without evidence of acute diverticulitis at this time. 6. Aortic atherosclerosis, as well as right coronary artery disease. 7. Small right pleural effusion with areas of dependent atelectasis in the right lung base. 8. Additional incidental findings, as above. Electronically Signed   By: Vinnie Langton M.D.   On: 03/16/2022 06:18   DG Chest 1 View  Result Date: 03/16/2022 CLINICAL DATA:  Encounter for central line placement EXAM: CHEST  1 VIEW COMPARISON:  Yesterday FINDINGS: Left IJ line with tip at the SVC origin. No pneumothorax or new mediastinal widening. There is cardiomegaly and aortic tortuosity accentuated by rotation. Congested appearance of vessels. Prior CABG. IMPRESSION: 1. Left IJ line with tip near the upper SVC.  No pneumothorax. 2. Increased vascular congestion. Electronically Signed   By: Jorje Guild M.D.   On: 03/16/2022 05:36   US Abdomen  Limited RUQ (LIVER/GB)  Result Date: 03/15/2022 CLINICAL DATA:  Elevated bilirubin. EXAM: ULTRASOUND ABDOMEN LIMITED RIGHT UPPER QUADRANT COMPARISON:  None Available. FINDINGS: Gallbladder: There is gallstone. No gallbladder wall thickening or pericholecystic fluid. Negative sonographic Murphy's sign. Common bile duct: Diameter: 4 mm Liver: The liver is unremarkable. Portal vein is patent on color Doppler imaging with normal direction of blood flow towards the liver. Other: None. IMPRESSION: Cholelithiasis without sonographic evidence of acute cholecystitis. Electronically Signed   By: Laren Everts.D.  On: 03/15/2022 21:16   DG Chest Port 1 View  Result Date: 03/15/2022 CLINICAL DATA:  Questionable sepsis. Evaluate for abnormality. Hyperglycemia. EXAM: PORTABLE CHEST 1 VIEW COMPARISON:  AP chest 02/13/2022, 10/24/2021 FINDINGS: Status post median sternotomy. Cardiac silhouette is again moderately enlarged. Tortuous thoracic aorta is similar to prior. Improved aeration of bilateral lungs with apparent resolution of the prior interstitial thickening. No focal airspace opacity to indicate pneumonia. No pleural effusion or pneumothorax. Moderate multilevel disc space narrowing and bridging osteophytes of the thoracic spine. IMPRESSION: Stable moderate cardiomegaly. Improved aeration of bilateral lungs with apparent resolution of the prior interstitial pulmonary edema. No focal airspace opacity to indicate pneumonia. Electronically Signed   By: Yvonne Kendall M.D.   On: 03/15/2022 15:56    Labs:  CBC: Recent Labs    02/12/22 0618 02/13/22 0625 02/14/22 0558 03/15/22 1519 03/16/22 1320  WBC 7.1 6.8 7.4 11.4*  --   HGB 11.4* 10.8* 10.9* 8.8* 8.2*  HCT 37.7* 34.7* 35.2* 27.1* 24.7*  PLT 155 180 159 124*  --     COAGS: Recent Labs    10/03/21 0303 02/11/22 1934 02/12/22 0618 02/13/22 1054 03/15/22 1519 03/16/22 0703  INR  --  1.2 1.4* 1.4* 1.6* 1.4*  APTT 35 33  --   --  37* 36     BMP: Recent Labs    03/15/22 1519 03/15/22 2109 03/16/22 0257 03/16/22 0703  NA 125* 127* 129* 129*  K 4.0 3.7 3.6 3.9  CL 90* 92* 95* 95*  CO2 21* _0 GLUCOSE 604* 485* 224* 191*  BUN 50* 50* 54* 55*  CALCIUM 8.5* 8.0* 8.0* 8.3*  CREATININE 2.34* 2.23* 2.29* 2.35*  GFRNONAA 30* 32* 31* 30*    LIVER FUNCTION TESTS: Recent Labs    10/31/21 0523 11/01/21 0452 02/11/22 1934 03/15/22 1519  BILITOT 0.6 0.5 0.5 2.1*  AST 21 14* 21 26  ALT 37 _1 ALKPHOS 297* 289* 142* 85  PROT 6.2* 6.8 7.9 6.7  ALBUMIN 2.3* 2.4* 4.1 3.2*    TUMOR MARKERS: No results for input(s): "AFPTM", "CEA", "CA199", "CHROMGRNA" in the last 8760 hours.  Assessment and Plan:  Right renal subcapsular hematoma; possible emphysematous pyelonephritis: Dr. Denna Haggard met with the patient at the bedside. He was awake, alert and oriented. He discussed his recent falls at home. He currently denies pain/discomfort. He seems to have improved from this morning.   Dr. Denna Haggard discussed with the patient that the perinephric hematoma could be explained by his falls and blood-thinner use. IR typically tries to avoid draining subcapsular hematomas due to the risk of worsening a tamponaded bleed.  The etiology of the gas that appears to be within the renal parenchyma/hematoma is less clear. The presence of gas usually suggests infection but his collecting system is completely decompressed. There is no clear reason why he would have developed an infection there.   At this time IR recommends to trend his CBC to look for any continued bleeding. A percutaneous nephrostomy tube is not indicated at this time because the collecting system does not appear to be obstructed. And given the patient's recent Eliquis administration, any procedure performed by IR would be high risk.   No procedure scheduled. The patient's Urology and Critical care teams are aware of IR recommendations.   Please call IR with any questions.    Thank you for this interesting consult.  I greatly enjoyed meeting [employer]. and look forward to participating in their  care.  A copy of this report was sent to the requesting provider on this date.  Electronically Signed: Soyla Dryer, AGACNP-BC [phone] 03/16/2022, 2:05 PM   I spent a total of 20 Minutes    in face to face in clinical consultation, greater than 50% of which was counseling/coordinating care for possible intervention for emphysematous pyelonephritis.

## 2022-03-16 NOTE — Consult Note (Addendum)
NAME:  Alec Stowers., MRN:  [id number], DOB:  1956-03-05, LOS: 1 ADMISSION DATE:  03/15/2022, CONSULTATION DATE:  03/16/22 REFERRING MD:  Dr. Ara Kussmaul, CHIEF COMPLAINT: Hyperglycemia  History of Present Illness:  66 year old male presenting to [facility] ED from home on 03/15/2022 due to concerns regarding hyperglycemia, confusion and concern for UTI.  History provided by patient bedside, wife and chart review. The wife reported he became somewhat confused on 02/11/2022, then overnight ended up on the floor while trying to get out of bed with the lift.  Patient denies any acute pain, nausea/vomiting/diarrhea, he denies shortness of breath/chest pain, he denies dysuria.  His glucose reading was over 400 for which his wife gave him 27 units of insulin at home on 11/16 and his blood pressure was noted to be soft with physical therapist.  Previous UA from 11/14 shows some WBCs, bacteria and is nitrite positive.  ED course: Upon arrival patient was hypotensive and tachycardic with dry mucous membranes.  Sepsis protocol initiated with concern for UTI.  Lab work significant for mild leukocytosis & lactic acidosis, profound hyperglycemia but with only mild acidosis and no elevated anion gap, pseudohyponatremia, AKI, troponin elevated but flat & increased osmolality.  Patient started on insulin drip and TRH consulted for admission after blood pressure rebounded post antibiotics and IV fluid resuscitation. Medications given: Cefepime and vancomycin, 2.8 L LR bolus and insulin drip started per Endo tool Initial Vitals: 98.1, 20, 108, 73/43 and 97% on room air Significant labs: (Labs/ Imaging personally reviewed) I, Domingo Pulse [place], AGACNP-BC, personally viewed and interpreted this ECG. EKG Interpretation: Date: 03/15/2022, EKG Time: 15:06, Rate: 104, Rhythm: ST, QRS Axis:  LAD, Intervals: none, ST/T Wave abnormalities: none, Narrative Interpretation: ST with RBBB & LAFB Chemistry: Na+:125, K+:  4.0, BUN/Cr.: 50/2.34, Serum CO2/ AG: 21/14, T.Bili: 2.1 Hematology: WBC: 11.4, Hgb: 8.8,  Troponin: 57 > 46, CK: 212, osmolality: 301, Lactic/ PCT: 2.2 > 1.9 > 2.1 > 1.7, COVID-19 & Influenza A/B: negative  UA: +leuks, bacteria, > 50 WBC's  CXR 03/15/22: stable moderate cardiomegaly, no acute cardiopulmonary process  PCCM consulted for assistance and management due to suspected sepsis with shock s/t UTI requiring vasopressor support.  Pertinent  Medical History  COPD CHF CAD Left BKA Type 2 diabetes Gout Hypertension Dyslipidemia OSA Atrial fibrillation on Eliquis Significant Hospital Events: Including procedures, antibiotic start and stop dates in addition to other pertinent events   03/15/22: Admit to SDU due to sepsis with shock s/t UTI. Overnight patient became hypotensive requiring vasopressor support, PCCM consulted.  Interim History / Subjective:  Patient drowsy but responsive, no acute complaints.  Levophed infusing peripherally.  Objective   Blood pressure (!) 113/47, pulse (!) 105, temperature 98.6 F (37 C), temperature source Oral, resp. rate (!) 23, height '6\' 4"'$  (1.93 m), weight (!) 146.9 kg, SpO2 99 %.    FiO2 (%):  [36 %] 36 %   Intake/Output Summary (Last 24 hours) at 03/16/2022 0253 Last data filed at 03/16/2022 0200 Gross per 24 hour  Intake 4108.46 ml  Output 50 ml  Net 4058.46 ml   Filed Weights   03/15/22 1448  Weight: (!) 146.9 kg    Examination: General: Adult male, acutely ill, lying in bed, NAD HEENT: MM pink/moist, anicteric, atraumatic, neck supple Neuro: A&O x 3, able to follow commands, PERRL +3, MAE CV: s1s2 RRR, ST on monitor, no r/m/g Pulm: Regular, non labored on 4 L nasal cannula, breath sounds clear-BUL & diminished-BLL GI: soft, rounded,  non tender, bs x 4 GU: external catheter in place  with clear yellow urine Skin:  no rashes/lesions noted Extremities: warm/dry, pulses + 2 R/P, no edema noted  Resolved Hospital Problem  list     Assessment & Plan:  Sepsis with septic shock due to suspected UTI - Supplemental oxygen as needed, to maintain SpO2 > 90% - f/u cultures, trend lactic/ PCT - Daily CBC, monitor WBC/ fever curve - IV antibiotics: ceftriaxone & vancomycin - IVF hydration as needed: will give additional IV fluid supplementation -Start vasopressors to maintain MAP< 65, norepinephrine - Strict I/O's: alert provider if UOP < 0.5 mL/kg/hr  Acute Kidney Injury in the setting of sepsis secondary to UTI & HHS Baseline Cr: 0.99, Cr on admission: 2.34 - Strict I/O's: alert provider if UOP < 0.5 mL/kg/hr - Avoid nephrotoxic agents as able, ensure adequate renal perfusion  HHS PMHx: T2DM -Hyperglycemia crisis protocol initiated - Aggressive IV hydration, when blood glucose falls below 250 add D5 to IV fluids - Insulin drip ordered per Endo tool protocol - Q 4 BMP, closely monitor potassium, replace electrolytes PRN - monitor blood glucose every 1 hour, per Endo tool protocol - trend serum CO2/ AG/ Lactic, f/u A1C - Diabetes coordinator consult - Resume home insulin regiment once appropriate  Chronic HFpEF without exacerbation Elevated Troponin secondary to demand ischemia Atrial fibrillation -on Eliquis - f/u BNP, lipid panel -Continue outpatient Eliquis & amiodarone - Continuous cardiac monitoring  - Daily weights to assess volume status - Hold diuresis & diltiazem while hypotensive   Hypothyroidism -Continue outpatient Synthroid  Best Practice (right click and "Reselect all SmartList Selections" daily)  Diet/type: NPO w/ oral meds DVT prophylaxis: DOAC GI prophylaxis: PPI Lines: [name] Foley:  [name] Code Status:  full code Last date of multidisciplinary goals of care discussion [per primary]  Labs   CBC: Recent Labs  Lab 03/15/22 1519  WBC 11.4*  NEUTROABS 9.8*  HGB 8.8*  HCT 27.1*  MCV 92.2  PLT 124*    Basic Metabolic Panel: Recent Labs  Lab 03/15/22 1519 03/15/22 2109   NA 125* 127*  K 4.0 3.7  CL 90* 92*  CO2 21* 24  GLUCOSE 604* 485*  BUN 50* 50*  CREATININE 2.34* 2.23*  CALCIUM 8.5* 8.0*  MG  --  1.9  PHOS  --  3.9   GFR: Estimated Creatinine Clearance: 50.4 mL/min (A) (by C-G formula based on SCr of 2.23 mg/dL (H)). Recent Labs  Lab 03/15/22 1519 03/15/22 1720 03/15/22 2109 03/15/22 2241 03/16/22 0045  PROCALCITON  --   --   --   --  14.20  WBC 11.4*  --   --   --   --   LATICACIDVEN 2.2* 1.9 2.1* 1.7  --     Liver Function Tests: Recent Labs  Lab 03/15/22 1519  AST 26  ALT 19  ALKPHOS 85  BILITOT 2.1*  PROT 6.7  ALBUMIN 3.2*   No results for input(s): "LIPASE", "AMYLASE" in the last 168 hours. No results for input(s): "AMMONIA" in the last 168 hours.  ABG    Component Value Date/Time   PHART 7.4 03/15/2022 2359   PCO2ART 42 03/15/2022 2359   PO2ART 97 03/15/2022 2359   HCO3 26.0 03/15/2022 2359   ACIDBASEDEF 0.8 03/15/2022 1512   O2SAT 98.6 03/15/2022 2359     Coagulation Profile: Recent Labs  Lab 03/15/22 1519  INR 1.6*    Cardiac Enzymes: Recent Labs  Lab 03/15/22 1519  CKTOTAL 212  HbA1C: Hgb A1c MFr Bld  Date/Time Value Ref Range Status  08/05/2021 04:27 AM 10.2 (H) 4.8 - 5.6 % Final    Comment:    (NOTE) Pre diabetes:          5.7%-6.4%  Diabetes:              >6.4%  Glycemic control for   <7.0% adults with diabetes   05/09/2021 07:20 AM 9.2 (H) 4.8 - 5.6 % Final    Comment:    (NOTE) Pre diabetes:          5.7%-6.4%  Diabetes:              >6.4%  Glycemic control for   <7.0% adults with diabetes     CBG: Recent Labs  Lab 03/15/22 2143 03/15/22 2234 03/15/22 2325 03/15/22 2351 03/16/22 0051  GLUCAP 444* 474* 450* 376* 294*    Review of Systems: Positives in bold  Gen: Denies fever, chills, weight change, fatigue, night sweats HEENT: Denies blurred vision, double vision, hearing loss, tinnitus, sinus congestion, rhinorrhea, sore throat, neck stiffness, dysphagia PULM:  Denies shortness of breath, cough, sputum production, hemoptysis, wheezing CV: Denies chest pain, edema, orthopnea, paroxysmal nocturnal dyspnea, palpitations GI: Denies abdominal pain, nausea, vomiting, diarrhea, hematochezia, melena, constipation, change in bowel habits GU: Denies dysuria, hematuria, polyuria, oliguria, urethral discharge Endocrine: Denies hot or cold intolerance, polyuria, polyphagia or appetite change Derm: Denies rash, dry skin, scaling or peeling skin change Heme: Denies easy bruising, bleeding, bleeding gums Neuro: Denies headache, numbness, weakness, slurred speech, loss of memory or consciousness  Past Medical History:  He,  has a past medical history of Arrhythmia, Asthma, CHF (congestive heart failure) ([place]), COPD (chronic obstructive pulmonary disease) ([place]), Coronary artery disease, Depression, Diabetes mellitus without complication ([place]), Gout, History anabolic steroid use, Hyperlipidemia, Hypertension, Hypogonadism in male, Morbid obesity ([place]), Myocardial infarction [facility]), Peripheral vascular disease ([place]), Perirectal abscess, Pleurisy, Sleep apnea, and Varicella.   Surgical History:   Past Surgical History:  Procedure Laterality Date   ABDOMINAL AORTIC ANEURYSM REPAIR     ACHILLES TENDON SURGERY Left 01/10/2021   Procedure: ACHILLES LENGTHENING/KIDNER;  Surgeon: Caroline More, DPM;  Location: [facility] ORS;  Service: Podiatry;  Laterality: Left;   AMPUTATION Left 10/03/2021   Procedure: AMPUTATION BELOW KNEE;  Surgeon: Katha Cabal, MD;  Location: [facility] ORS;  Service: Vascular;  Laterality: Left;   AMPUTATION TOE Right 02/10/2016   Procedure: AMPUTATION TOE 3RD TOE;  Surgeon: Samara Deist, DPM;  Location: [facility];  Service: Podiatry;  Laterality: Right;   AMPUTATION TOE Left 02/24/2020   Procedure: AMPUTATION TOE;  Surgeon: Caroline More, DPM;  Location: [facility];  Service: Podiatry;  Laterality: Left;   APPLICATION OF WOUND VAC Left 02/29/2020   Procedure:  APPLICATION OF WOUND VAC;  Surgeon: Caroline More, DPM;  Location: [facility];  Service: Podiatry;  Laterality: Left;   CARDIOVERSION [name] 02/13/2022   Procedure: CARDIOVERSION;  Surgeon: Corey Skains, MD;  Location: [facility];  Service: Cardiovascular;  Laterality: [name];   COLONOSCOPY WITH PROPOFOL [name] 11/18/2015   Procedure: COLONOSCOPY WITH PROPOFOL;  Surgeon: Manya Silvas, MD;  Location[facility];  Service: Endoscopy;  Laterality: [name];   CORONARY ARTERY BYPASS GRAFT     CORONARY STENT INTERVENTION [name] 02/02/2020   Procedure: CORONARY STENT INTERVENTION;  Surgeon: Isaias Cowman, MD;  Location: [place] [facility];  Service: Cardiovascular;  Laterality: [name];   INCISION AND DRAINAGE Left 08/07/2021   Procedure: INCISION AND DRAINAGE-Partial Calcanectomy;  Surgeon: Caroline More, DPM;  Location: [facility];  Service: Podiatry;  Laterality: Left;   IRRIGATION AND DEBRIDEMENT FOOT Left 02/29/2020   Procedure: IRRIGATION AND DEBRIDEMENT FOOT;  Surgeon: Caroline More, DPM;  Location: [facility];  Service: Podiatry;  Laterality: Left;   IRRIGATION AND DEBRIDEMENT FOOT Left 02/24/2020   Procedure: IRRIGATION AND DEBRIDEMENT FOOT;  Surgeon: Caroline More, DPM;  Location: [facility];  Service: Podiatry;  Laterality: Left;   KNEE ARTHROSCOPY     LEFT HEART CATH AND CORS/GRAFTS ANGIOGRAPHY [name] 02/02/2020   Procedure: LEFT HEART CATH AND CORS/GRAFTS ANGIOGRAPHY;  Surgeon: Teodoro Spray, MD;  Location: [place] [facility];  Service: Cardiovascular;  Laterality: [name];   LOWER EXTREMITY ANGIOGRAPHY Left 02/25/2020   Procedure: Lower Extremity Angiography;  Surgeon: Algernon Huxley, MD;  Location: [place] [facility];  Service: Cardiovascular;  Laterality: Left;   LOWER EXTREMITY ANGIOGRAPHY Left 01/04/2021   Procedure: LOWER EXTREMITY ANGIOGRAPHY;  Surgeon: Algernon Huxley, MD;  Location: [place] [facility];  Service: Cardiovascular;  Laterality: Left;   METATARSAL HEAD EXCISION Left 01/10/2021    Procedure: METATARSAL HEAD EXCISION - LEFT 5th;  Surgeon: Caroline More, DPM;  Location: [facility];  Service: Podiatry;  Laterality: Left;   PERIPHERAL VASCULAR CATHETERIZATION Right 01/24/2016   Procedure: Lower Extremity Angiography;  Surgeon: Katha Cabal, MD;  Location: [place] [facility];  Service: Cardiovascular;  Laterality: Right;   PERIPHERAL VASCULAR CATHETERIZATION Right 01/25/2016   Procedure: Lower Extremity Angiography;  Surgeon: Katha Cabal, MD;  Location: [place] [facility];  Service: Cardiovascular;  Laterality: Right;   TOE AMPUTATION     TONSILLECTOMY       Social History:   reports that he quit smoking about 6 years ago. His smoking use included cigarettes. He has a 22.50 pack-year smoking history. He has never used smokeless tobacco. He reports that he does not currently use alcohol after a past usage of about 3.0 standard drinks of alcohol per week. He reports that he does not use drugs.   Family History:  His family history includes Alcohol abuse in his father; Coronary artery disease in his father; Heart failure in his brother; Hypertension in his father.   Allergies Allergies  Allergen Reactions   Penicillins Other (See Comments)    Happened at 66 years old and pt. stated he passed out  He has tolerated amoxicillin/clavulanate and ampicillin/sulbactam   Statins     Other reaction(s): Muscle Pain Causes legs to ache per pt   Metformin And Related Diarrhea     Home Medications  Prior to Admission medications   Medication Sig Start Date End Date Taking? Authorizing Provider  amiodarone (PACERONE) 200 MG tablet Take 200 mg by mouth daily.   Yes [provider]  apixaban (ELIQUIS) 5 MG TABS tablet Take 1 tablet (5 mg total) by mouth 2 (two) times daily. 01/26/16  Yes Stegmayer, Janalyn Harder, PA-C  aspirin EC 81 MG EC tablet Take 1 tablet (81 mg total) by mouth daily. Swallow whole. 05/17/21  Yes Dessa Phi, DO  budesonide (PULMICORT) 0.5 MG/2ML  nebulizer solution Take 2 mLs (0.5 mg total) by nebulization 2 (two) times daily. 11/10/21  Yes Emeterio Reeve, DO  Cholecalciferol 25 MCG (1000 UT) tablet Take 1,000 Units by mouth daily.   Yes [provider]  Cyanocobalamin (VITAMIN B-12) 5000 MCG TBDP Take 10,000 mcg by mouth daily.   Yes [provider]  diltiazem (CARDIZEM SR) 120 MG 12 hr capsule Take 1  capsule (120 mg total) by mouth every 12 (twelve) hours. 11/10/21  Yes Emeterio Reeve, DO  ezetimibe (ZETIA) 10 MG tablet Take 10 mg by mouth at bedtime.   Yes [provider]  ferrous sulfate 325 (65 FE) MG tablet Take 325 mg by mouth daily with breakfast.   Yes [provider]  finasteride (PROSCAR) 5 MG tablet Take 1 tablet (5 mg total) by mouth daily. 11/11/21  Yes Emeterio Reeve, DO  furosemide (LASIX) 20 MG tablet Take 2 tablets (40 mg total) by mouth daily. 02/15/22  Yes Fritzi Mandes, MD  HUMALOG 100 UNIT/ML injection Inject 15 Units into the skin 3 (three) times daily with meals. >200 02/05/22  Yes [provider]  insulin glargine-yfgn (SEMGLEE) 100 UNIT/ML injection Inject 0.25 mLs (25 Units total) into the skin at bedtime. Patient taking differently: Inject 0-25 Units into the skin at bedtime. 11/10/21  Yes Emeterio Reeve, DO  levothyroxine (SYNTHROID) 25 MCG tablet Take 1 tablet (25 mcg total) by mouth daily at 6 (six) AM. 11/11/21  Yes Emeterio Reeve, DO  Multiple Vitamins-Minerals (MULTIVITAMIN WITH MINERALS) tablet Take 1 tablet by mouth daily.   Yes [provider]  nitrofurantoin, macrocrystal-monohydrate, (MACROBID) 100 MG capsule Take 100 mg by mouth daily. 03/13/22 06/11/22 Yes [provider]  nystatin (MYCOSTATIN/NYSTOP) powder Apply topically 2 (two) times daily. 11/10/21  Yes Emeterio Reeve, DO  primidone (MYSOLINE) 250 MG tablet Take 250 mg by mouth 2 (two) times daily. 01/23/20  Yes [provider]  risperiDONE (RISPERDAL) 0.5 MG  tablet Take 1 tablet (0.5 mg total) by mouth 2 (two) times daily. 11/10/21  Yes Emeterio Reeve, DO  tamsulosin (FLOMAX) 0.4 MG CAPS capsule Take 0.4 mg by mouth daily.   Yes [provider]  vitamin C (ASCORBIC ACID) 500 MG tablet Take 500 mg by mouth daily.   Yes [provider]  acetaminophen (TYLENOL) 325 MG tablet Take 2 tablets (650 mg total) by mouth every 6 (six) hours as needed for mild pain (or Fever >/= 101). 11/10/21   Emeterio Reeve, DO  insulin aspart (NOVOLOG) 100 UNIT/ML injection Inject 0-20 Units into the skin 3 (three) times daily with meals. Patient not taking: Reported on 03/15/2022 11/10/21   Emeterio Reeve, DO  metFORMIN (GLUCOPHAGE) 1000 MG tablet Take 1,000 mg by mouth 2 (two) times daily.  Patient not taking: Reported on 03/15/2022 12/02/17   [provider]  traMADol (ULTRAM) 50 MG tablet Take 1 tablet (50 mg total) by mouth daily as needed for moderate pain. 08/11/21   Fritzi Mandes, MD     Critical care time: 25 minutes      Venetia Night, AGACNP-BC Acute Care Nurse Practitioner [facility]   [phone] / [phone] Please see Amion for pager details.

## 2022-03-16 NOTE — Progress Notes (Signed)
An USGPIV (ultrasound guided PIV) has been placed for short-term vasopressor infusion. A correctly placed ivWatch must be used when administering Vasopressors. Should this treatment be needed beyond 72 hours, central line access should be obtained.  It will be the responsibility of the bedside nurse to follow best practice to prevent extravasations.   ?

## 2022-03-16 NOTE — Progress Notes (Signed)
As patient continued to have deteriorated and required more pressors, PCCM to took over his care at this morning.

## 2022-03-16 NOTE — Progress Notes (Signed)
       CROSS COVER NOTE  NAME: [name]. MRN: [id number] DOB : [birth year]    Time of Service   [phone]  HPI/Events of Note   Patient admitted with sepsis from UTI with associated AKI and elevated bili. One day priot at PCP office, all chemistries normal.  Also complained of abd pain during admission per MD note  Assessment and  Interventions   Assessment: CT abdomen without contrast IMPRESSION: 1. Large subcapsular hematoma associated with the right kidney causing compression of the right kidney; clinical correlation for signs and symptoms of [place] kidney is recommended. 2. Notably, there is a large amount of gas in the inferolateral aspect of the right kidney, extending into the overlying subcapsular hematoma. Findings are concerning for emphysematous pyelonephritis. Correlation with urinalysis is recommended. 3. Hemorrhage from the right subcapsular hematoma has extended outward into the right retroperitoneum and adjacent peritoneal spaces, as above. 4. Small amount of intermediate attenuation material lying dependently in the gallbladder, likely to reflect biliary sludge and/or small gallstones. No findings to suggest an acute cholecystitis at this time. 5. Hepatic steatosis with evidence of hepatic cirrhosis, as above. Colonic diverticulosis without evidence of acute diverticulitis at this time. 6. Aortic atherosclerosis, as well as right coronary artery disease. 7. Small right pleural effusion with areas of dependent atelectasis in the right lung base. 8. Additional incidental findings, as above. Plan: 1 unit PRBC ordered for transfusion Repeat COAGS Discussed results with DR Piscoya with general surgery Will need urology to evaluate, message left with attending PCCM also following, eliquis discontinued as well as antihypertensives except amiodarone Pressor management per PCCM team      Kathlene Cote NP [facility]

## 2022-03-16 NOTE — Consult Note (Signed)
Pharmacy Antibiotic Note  [name]. is a 66 y.o. male w/ PMH of atrial fibrillation on Eliquis, CHF, COPD, CAD, diabetes, hypertension, hyperlipidemia admitted on 03/15/2022 with sepsis.  Pharmacy has been consulted for vancomycin dosing. Patient received a total of vancomycin 2500 mg IV as a loading dose.  Presented to ED with elevated blood glucose and weakness.  AKI with current Scr = 2.35.  During previous hospitalization in [place] of this year Scr ~ 1.  Plan: will dose based on levels due to AKI --vancomycin variable placeholder  Height: '6\' 4"'$  (193 cm) Weight: (!) 146.9 kg (323 lb 13.7 oz) IBW/kg (Calculated) : 86.8  Temp (24hrs), Avg:98.5 F (36.9 C), Min:98 F (36.7 C), Max:99.6 F (37.6 C)  Recent Labs  Lab 03/15/22 1519 03/15/22 1720 03/15/22 2109 03/15/22 2241 03/16/22 0257 03/16/22 0703  WBC 11.4*  --   --   --   --   --   CREATININE 2.34*  --  2.23*  --  2.29* 2.35*  LATICACIDVEN 2.2* 1.9 2.1* 1.7  --   --      Estimated Creatinine Clearance: 47.8 mL/min (A) (by C-G formula based on SCr of 2.35 mg/dL (H)).    Allergies  Allergen Reactions   Penicillins Other (See Comments)    Happened at 66 years old and pt. stated he passed out  He has tolerated amoxicillin/clavulanate and ampicillin/sulbactam   Statins     Other reaction(s): Muscle Pain Causes legs to ache per pt   Metformin And Related Diarrhea    Antimicrobials this admission: ceftriaxone 11/16>> vancomycin 11/16 >>   Microbiology results: 11/16 BCx: 1 set (both bottles) of 2 with K pneumoniae  11/16 UCx: pending   Thank you for allowing pharmacy to be a part of this patient's care.  Dallie Piles, PharmD 03/16/2022 2:30 PM

## 2022-03-16 NOTE — Progress Notes (Signed)
PHARMACY - PHYSICIAN COMMUNICATION CRITICAL VALUE ALERT - BLOOD CULTURE IDENTIFICATION (BCID)  Results for orders placed or performed during the hospital encounter of 03/15/22  Resp Panel by RT-PCR (Flu A&B, Covid) Anterior Nasal Swab     Status: None   Collection Time: 03/15/22  3:19 PM   Specimen: Anterior Nasal Swab  Result Value Ref Range Status   SARS Coronavirus 2 by RT PCR NEGATIVE NEGATIVE Final    Comment: (NOTE) SARS-CoV-2 target nucleic acids are NOT DETECTED.  The SARS-CoV-2 RNA is generally detectable in upper respiratory specimens during the acute phase of infection. The lowest concentration of SARS-CoV-2 viral copies this assay can detect is 138 copies/mL. A negative result does not preclude SARS-Cov-2 infection and should not be used as the sole basis for treatment or other patient management decisions. A negative result may occur with  improper specimen collection/handling, submission of specimen other than nasopharyngeal swab, presence of viral mutation(s) within the areas targeted by this assay, and inadequate number of viral copies(<138 copies/mL). A negative result must be combined with clinical observations, patient history, and epidemiological information. The expected result is Negative.  Fact Sheet for Patients:  EntrepreneurPulse.com.au  Fact Sheet for Healthcare Providers:  IncredibleEmployment.be  This test is no t yet approved or cleared by the [place] FDA and  has been authorized for detection and/or diagnosis of SARS-CoV-2 by FDA under an Emergency Use Authorization (EUA). This EUA will remain  in effect (meaning this test can be used) for the duration of the COVID-19 declaration under Section 564(b)(1) of the Act, 21 U.S.C.section 360bbb-3(b)(1), unless the authorization is terminated  or revoked sooner.       Influenza A by PCR NEGATIVE NEGATIVE Final   Influenza B by PCR NEGATIVE NEGATIVE Final     Comment: (NOTE) The Xpert Xpress SARS-CoV-2/FLU/RSV plus assay is intended as an aid in the diagnosis of influenza from Nasopharyngeal swab specimens and should not be used as a sole basis for treatment. Nasal washings and aspirates are unacceptable for Xpert Xpress SARS-CoV-2/FLU/RSV testing.  Fact Sheet for Patients: EntrepreneurPulse.com.au  Fact Sheet for Healthcare Providers: IncredibleEmployment.be  This test is not yet approved or cleared by the [place] FDA and has been authorized for detection and/or diagnosis of SARS-CoV-2 by FDA under an Emergency Use Authorization (EUA). This EUA will remain in effect (meaning this test can be used) for the duration of the COVID-19 declaration under Section 564(b)(1) of the Act, 21 U.S.C. section 360bbb-3(b)(1), unless the authorization is terminated or revoked.  Performed at [facility], [address] [place], [place] [address]   Blood Culture (routine x 2)     Status: None (Preliminary result)   Collection Time: 03/15/22  3:19 PM   Specimen: BLOOD LEFT HAND  Result Value Ref Range Status   Specimen Description BLOOD LEFT HAND  Final   Special Requests   Final    BOTTLES DRAWN AEROBIC AND ANAEROBIC Blood Culture adequate volume   Culture  Setup Time   Final    GRAM NEGATIVE RODS IN BOTH AEROBIC AND ANAEROBIC BOTTLES Organism ID to follow CRITICAL RESULT CALLED TO, READ BACK BY AND VERIFIED WITH: [name] 03/16/2022 AT 0408 SRR Performed at [facility], [address], [place], [place] [address]    Culture GRAM NEGATIVE RODS  Final   Report Status PENDING  Incomplete  Blood Culture ID Panel (Reflexed)     Status: Abnormal   Collection Time: 03/15/22  3:19 PM  Result Value  Ref Range Status   Enterococcus faecalis NOT DETECTED NOT DETECTED Final   Enterococcus Faecium NOT DETECTED NOT DETECTED Final   Listeria monocytogenes NOT DETECTED NOT DETECTED Final    Staphylococcus species NOT DETECTED NOT DETECTED Final   Staphylococcus aureus (BCID) NOT DETECTED NOT DETECTED Final   Staphylococcus epidermidis NOT DETECTED NOT DETECTED Final   Staphylococcus lugdunensis NOT DETECTED NOT DETECTED Final   Streptococcus species NOT DETECTED NOT DETECTED Final   Streptococcus agalactiae NOT DETECTED NOT DETECTED Final   Streptococcus pneumoniae NOT DETECTED NOT DETECTED Final   Streptococcus pyogenes NOT DETECTED NOT DETECTED Final   A.calcoaceticus-baumannii NOT DETECTED NOT DETECTED Final   Bacteroides fragilis NOT DETECTED NOT DETECTED Final   Enterobacterales DETECTED (A) NOT DETECTED Final    Comment: Enterobacterales represent a large order of gram negative bacteria, not a single organism. CRITICAL RESULT CALLED TO, READ BACK BY AND VERIFIED WITH: [name] 03/16/2022 AT 0408 SRR    Enterobacter cloacae complex NOT DETECTED NOT DETECTED Final   Escherichia coli NOT DETECTED NOT DETECTED Final   Klebsiella aerogenes NOT DETECTED NOT DETECTED Final   Klebsiella oxytoca NOT DETECTED NOT DETECTED Final   Klebsiella pneumoniae DETECTED (A) NOT DETECTED Final    Comment: CRITICAL RESULT CALLED TO, READ BACK BY AND VERIFIED WITH: [name] 03/16/2022 AT 0408 SRR    Proteus species NOT DETECTED NOT DETECTED Final   Salmonella species NOT DETECTED NOT DETECTED Final   Serratia marcescens NOT DETECTED NOT DETECTED Final   Haemophilus influenzae NOT DETECTED NOT DETECTED Final   Neisseria meningitidis NOT DETECTED NOT DETECTED Final   Pseudomonas aeruginosa NOT DETECTED NOT DETECTED Final   Stenotrophomonas maltophilia NOT DETECTED NOT DETECTED Final   Candida albicans NOT DETECTED NOT DETECTED Final   Candida auris NOT DETECTED NOT DETECTED Final   Candida glabrata NOT DETECTED NOT DETECTED Final   Candida krusei NOT DETECTED NOT DETECTED Final   Candida parapsilosis NOT DETECTED NOT DETECTED Final   Candida tropicalis NOT DETECTED NOT DETECTED  Final   Cryptococcus neoformans/gattii NOT DETECTED NOT DETECTED Final   CTX-M ESBL NOT DETECTED NOT DETECTED Final   Carbapenem resistance IMP NOT DETECTED NOT DETECTED Final   Carbapenem resistance KPC NOT DETECTED NOT DETECTED Final   Carbapenem resistance NDM NOT DETECTED NOT DETECTED Final   Carbapenem resist OXA 48 LIKE NOT DETECTED NOT DETECTED Final   Carbapenem resistance VIM NOT DETECTED NOT DETECTED Final    Comment: Performed at [facility], [address] [place], [place] [address]  MRSA Next Gen by PCR, Nasal     Status: Abnormal   Collection Time: 03/15/22 10:02 PM   Specimen: Nasal Mucosa; Nasal Swab  Result Value Ref Range Status   MRSA by PCR Next Gen DETECTED (A) NOT DETECTED Final    Comment: RESULT CALLED TO, READ BACK BY AND VERIFIED WITH: [name] 03/15/2022 AT 2329 SRR (NOTE) The GeneXpert MRSA Assay (FDA approved for NASAL specimens only), is one component of a comprehensive MRSA colonization surveillance program. It is not intended to diagnose MRSA infection nor to guide or monitor treatment for MRSA infections. Test performance is not FDA approved in patients less than 66 years old. Performed at [facility], [address] [place], [place] [address]    BCID Results:  1 set (both bottles) of 2 with Klebsiella pneumoniae, no resistance.  MRSA by PCR Detected.  Pt currently on Cefepime & Vancomycin for sepsis secondary to UTI.   Name of provider  contacted: Alec Fee, NP   Changes to prescribed antibiotics required: Transition Cefepime to Ceftriaxone, continue Vancomycin for now.  [name], PharmD, [facility] 03/16/2022 4:42 AM

## 2022-03-17 DIAGNOSIS — S37001A Unspecified injury of right kidney, initial encounter: Secondary | ICD-10-CM | POA: Diagnosis not present

## 2022-03-17 DIAGNOSIS — N390 Urinary tract infection, site not specified: Secondary | ICD-10-CM | POA: Diagnosis not present

## 2022-03-17 DIAGNOSIS — A419 Sepsis, unspecified organism: Secondary | ICD-10-CM | POA: Diagnosis not present

## 2022-03-17 LAB — BASIC METABOLIC PANEL
Anion gap: 9 (ref 5–15)
BUN: 53 mg/dL — ABNORMAL HIGH (ref 8–23)
CO2: 24 mmol/L (ref 22–32)
Calcium: 8 mg/dL — ABNORMAL LOW (ref 8.9–10.3)
Chloride: 100 mmol/L (ref 98–111)
Creatinine, Ser: 2.06 mg/dL — ABNORMAL HIGH (ref 0.61–1.24)
GFR, Estimated: 35 mL/min — ABNORMAL LOW (ref >60)
Glucose, Bld: 226 mg/dL — ABNORMAL HIGH (ref 70–99)
Potassium: 3.8 mmol/L (ref 3.5–5.1)
Sodium: 133 mmol/L — ABNORMAL LOW (ref 135–145)

## 2022-03-17 LAB — CBC
HCT: 24.3 % — ABNORMAL LOW (ref 39.0–52.0)
Hemoglobin: 8.1 g/dL — ABNORMAL LOW (ref 13.0–17.0)
MCH: 30.2 pg (ref 26.0–34.0)
MCHC: 33.3 g/dL (ref 30.0–36.0)
MCV: 90.7 fL (ref 80.0–100.0)
Platelets: 104 10*3/uL — ABNORMAL LOW (ref 150–400)
RBC: 2.68 MIL/uL — ABNORMAL LOW (ref 4.22–5.81)
RDW: 16.1 % — ABNORMAL HIGH (ref 11.5–15.5)
WBC: 7.3 10*3/uL (ref 4.0–10.5)
nRBC: 0 % (ref 0.0–0.2)

## 2022-03-17 LAB — BPAM RBC
Blood Product Expiration Date: 202312202359
ISSUE DATE / TIME: 202311172259
Unit Type and Rh: 5100

## 2022-03-17 LAB — TYPE AND SCREEN
ABO/RH(D): O NEG
Antibody Screen: NEGATIVE
Unit division: 0

## 2022-03-17 LAB — URINE CULTURE: Culture: >=100000 — AB

## 2022-03-17 LAB — GLUCOSE, CAPILLARY
Glucose-Capillary: 153 mg/dL — ABNORMAL HIGH (ref 70–99)
Glucose-Capillary: 171 mg/dL — ABNORMAL HIGH (ref 70–99)
Glucose-Capillary: 200 mg/dL — ABNORMAL HIGH (ref 70–99)
Glucose-Capillary: 201 mg/dL — ABNORMAL HIGH (ref 70–99)
Glucose-Capillary: 232 mg/dL — ABNORMAL HIGH (ref 70–99)
Glucose-Capillary: 238 mg/dL — ABNORMAL HIGH (ref 70–99)
Glucose-Capillary: 243 mg/dL — ABNORMAL HIGH (ref 70–99)

## 2022-03-17 LAB — PROCALCITONIN: Procalcitonin: 7.33 ng/mL

## 2022-03-17 LAB — VANCOMYCIN, RANDOM: Vancomycin Rm: 8 ug/mL

## 2022-03-17 LAB — PHOSPHORUS: Phosphorus: 4 mg/dL (ref 2.5–4.6)

## 2022-03-17 LAB — MAGNESIUM: Magnesium: 1.9 mg/dL (ref 1.7–2.4)

## 2022-03-17 MED ORDER — PANTOPRAZOLE SODIUM 40 MG PO TBEC
40.0000 mg | DELAYED_RELEASE_TABLET | Freq: Every day | ORAL | Status: DC
  Administered 2022-03-18 – 2022-03-23 (×6): 40 mg via ORAL
  Filled 2022-03-17 (×6): qty 1

## 2022-03-17 NOTE — Plan of Care (Signed)
Continuing with plan of care. 

## 2022-03-17 NOTE — Progress Notes (Signed)
Pulmonary and Critical care    NAME:  Alec Glazebrook., MRN:  [id number], DOB:  1956-02-26, LOS: 2 ADMISSION DATE:  03/15/2022,     History of Present Illness:   The patient is a 66 year old male with history of asthma COPD CHF diabetes prior history of left BKA recent hospitalization for fall and atrial fibrillation. The patient came to the emergency room on November 16 for altered mental status confusion and hyperglycemia at home.  The patient had a fall at home.  Wife was unable to use left to get patient into bed. In the emergency room patient had blood pressure 73/43 pulse 108 temperature 98.1 F. Lactic acid was 2.2 sodium 125 bicarbonate 21 creatinine 2.3 WBC count 11.4 hemoglobin 8.8 platelet count 124. Patient was noted to have atrial flutter in the ER.  The patient was started on insulin infusion and was started on empiric antibiotics for urinary tract infection with sepsis protocol. The patient had CT of the abdomen done which showed large subcapsular hematoma of the right kidney as well as findings of large amount of gas concerning for emphysematous pyelonephritis.  Blood cultures are also positive for gram-negative rods. The patient has been evaluated by urology, not a candidate for percutaneous nephrostomy due to recent Eliquis use.  Will be closely monitored if ureteral stenting is needed Today November 18.  Vasopressor requirements improved.  Patient was off vasopressors overnight however this morning due to hypotension vasopressors were resumed.  Blood cultures show Enterobacter and Klebsiella pneumonia urine culture also shows Klebsiella Nasal MRSA was positive patient reports hungry denies any back pain no nausea vomiting denies shortness of breath more awake and alert and communicative this morning.  Hyponatremia improved creatinine also improved.         03/17/2022    8:30 AM 03/17/2022    8:00 AM 03/17/2022    7:30 AM  Vitals with BMI  Systolic 100 712 99   Diastolic 64 61 46  Pulse 90 65 88        Examination: General: Awake and interactive. HENT: Pallor present no icterus no JVD Lungs: Air entry heard bilaterally diminished no wheezing Cardiovascular: No murmurs or gallops Abdomen: Mildly distended bowel sounds were heard. Extremities: BKA present.  Pulses palpable Neuro: Moving all extremities to commands.  Assessment & Plan:  1.  Septic shock, with bacteremia and urinary tract infection emphysematous pyelonephritis. 2.  Acute kidney injury. 3.  Type 2 diabetes with hyperglycemia. 4.  Metabolic encephalopathy. 5.  Subcapsular hematoma right kidney. 6.  Right pleural effusion with atelectasis. 7.  History of COPD and CHF 8.  Hepatic steatosis 9.  Hypothyroidism 10.  Atrial flutter Plan. Continue on ceftriaxone.   wean off vasopressors   stop vancomycin . Continue sliding scale insulin with a long-acting Resume p.o. medications including amiodarone Repeat blood cultures tomorrow.  Best Practice (right click and "Reselect all SmartList Selections" daily)   Diet/type: Regular consistency (see orders) DVT prophylaxis: SCD GI prophylaxis: PPI Lines: Central line  Code Status:  full code   Labs   CBC: Recent Labs  Lab 03/15/22 1519 03/16/22 1320 03/16/22 2007 03/17/22 0318  WBC 11.4*  --  7.0 7.3  NEUTROABS 9.8*  --   --   --   HGB 8.8* 8.2* 7.6* 8.1*  HCT 27.1* 24.7* 23.3* 24.3*  MCV 92.2  --  90.3 90.7  PLT 124*  --  104* 104*    Basic Metabolic Panel: Recent Labs  Lab 03/15/22 2109 03/16/22 0257  03/16/22 0703 03/16/22 1800 03/17/22 0318  NA 127* 129* 129* 131* 133*  K 3.7 3.6 3.9 3.9 3.8  CL 92* 95* 95* 97* 100  CO2 '24 24 24 22 24  '$ GLUCOSE 485* 224* 191* 268* 226*  BUN 50* 54* 55* 55* 53*  CREATININE 2.23* 2.29* 2.35* 2.24* 2.06*  CALCIUM 8.0* 8.0* 8.3* 7.9* 8.0*  MG 1.9  --   --   --  1.9  PHOS 3.9  --   --   --  4.0   GFR: Estimated Creatinine Clearance: 54.5 mL/min (A) (by C-G formula  based on SCr of 2.06 mg/dL (H)). Recent Labs  Lab 03/15/22 1519 03/15/22 1720 03/15/22 2109 03/15/22 2241 03/16/22 0045 03/16/22 2007 03/17/22 0318  PROCALCITON  --   --   --   --  14.20  --  7.33  WBC 11.4*  --   --   --   --  7.0 7.3  LATICACIDVEN 2.2* 1.9 2.1* 1.7  --   --   --     Liver Function Tests: Recent Labs  Lab 03/15/22 1519  AST 26  ALT 19  ALKPHOS 85  BILITOT 2.1*  PROT 6.7  ALBUMIN 3.2*   Recent Labs  Lab 03/16/22 0257  LIPASE 22   No results for input(s): "AMMONIA" in the last 168 hours.  ABG    Component Value Date/Time   PHART 7.4 03/15/2022 2359   PCO2ART 42 03/15/2022 2359   PO2ART 97 03/15/2022 2359   HCO3 26.0 03/15/2022 2359   ACIDBASEDEF 0.8 03/15/2022 1512   O2SAT 98.6 03/15/2022 2359     Coagulation Profile: Recent Labs  Lab 03/15/22 1519 03/16/22 0703  INR 1.6* 1.4*    Cardiac Enzymes: Recent Labs  Lab 03/15/22 1519  CKTOTAL 212    HbA1C: Hgb A1c MFr Bld  Date/Time Value Ref Range Status  03/15/2022 09:09 PM 8.9 (H) 4.8 - 5.6 % Final    Comment:    (NOTE)         Prediabetes: 5.7 - 6.4         Diabetes: >6.4         Glycemic control for adults with diabetes: <7.0   08/05/2021 04:27 AM 10.2 (H) 4.8 - 5.6 % Final    Comment:    (NOTE) Pre diabetes:          5.7%-6.4%  Diabetes:              >6.4%  Glycemic control for   <7.0% adults with diabetes     CBG: Recent Labs  Lab 03/16/22 1559 03/16/22 1957 03/17/22 0006 03/17/22 0352 03/17/22 0830  GLUCAP 249* 263* 238* 201* 153*      Review of Systems:   Positives in BOLD: Gen: Denies fever, chills, weight change, fatigue, night sweats HEENT: Denies blurred vision, double vision, hearing loss, tinnitus, sinus congestion, rhinorrhea, sore throat, neck stiffness, dysphagia PULM: Denies shortness of breath, cough, sputum production, hemoptysis, wheezing CV: Denies chest pain, edema, orthopnea, paroxysmal nocturnal dyspnea, palpitations GI: Denies  abdominal pain, nausea, vomiting, diarrhea, hematochezia, melena, constipation, change in bowel habits GU: Denies dysuria, hematuria, polyuria, oliguria, urethral discharge Endocrine: Denies hot or cold intolerance, polyuria, polyphagia or appetite change Derm: Denies rash, dry skin, scaling or peeling skin change Heme: Denies easy bruising, bleeding, bleeding gums Neuro: Denies headache, numbness, weakness, slurred speech, loss of memory or consciousness   Past Medical History:  He,  has a past medical history of Arrhythmia, Asthma, CHF (congestive  heart failure) ([place]), COPD (chronic obstructive pulmonary disease) ([place]), Coronary artery disease, Depression, Diabetes mellitus without complication ([place]), Gout, History anabolic steroid use, Hyperlipidemia, Hypertension, Hypogonadism in male, Morbid obesity ([place]), Myocardial infarction [facility]), Peripheral vascular disease ([place]), Perirectal abscess, Pleurisy, Sleep apnea, and Varicella.   Surgical History:   Past Surgical History:  Procedure Laterality Date   ABDOMINAL AORTIC ANEURYSM REPAIR     ACHILLES TENDON SURGERY Left 01/10/2021   Procedure: ACHILLES LENGTHENING/KIDNER;  Surgeon: Caroline More, DPM;  Location: [facility] OR[facility];  Service: Podiatry;  Laterality: Left;   AMPUTATION Left 10/03/2021   Procedure: AMPUTATION BELOW KNEE;  Surgeon: Katha Cabal, MD;  Location: [facility];  Service: Vascular;  Laterality: Left;   AMPUTATION TOE Right 02/10/2016   Procedure: AMPUTATION TOE 3RD TOE;  Surgeon: Samara Deist, DPM;  Location: [facility];  Service: Podiatry;  Laterality: Right;   AMPUTATION TOE Left 02/24/2020   Procedure: AMPUTATION TOE;  Surgeon: Caroline More, DPM;  Location: [facility];  Service: Podiatry;  Laterality: Left;   APPLICATION OF WOUND VAC Left 02/29/2020   Procedure: APPLICATION OF WOUND VAC;  Surgeon: Caroline More, DPM;  Location: [facility];  Service: Podiatry;  Laterality: Left;   CARDIOVERSION [name] 02/13/2022   Procedure:  CARDIOVERSION;  Surgeon: Corey Skains, MD;  Location: [facility];  Service: Cardiovascular;  Laterality: [name];   COLONOSCOPY WITH PROPOFOL [name] 11/18/2015   Procedure: COLONOSCOPY WITH PROPOFOL;  Surgeon: Manya Silvas, MD;  Location: [facility]COPY;  Service: Endoscopy;  Laterality: [name];   CORONARY ARTERY BYPASS GRAFT     CORONARY STENT INTERVENTION [name] 02/02/2020   Procedure: CORONARY STENT INTERVENTION;  Surgeon: Isaias Cowman, MD;  Location: [place] [facility];  Service: Cardiovascular;  Laterality: [name];   INCISION AND DRAINAGE Left 08/07/2021   Procedure: INCISION AND DRAINAGE-Partial Calcanectomy;  Surgeon: Caroline More, DPM;  Location: [facility];  Service: Podiatry;  Laterality: Left;   IRRIGATION AND DEBRIDEMENT FOOT Left 02/29/2020   Procedure: IRRIGATION AND DEBRIDEMENT FOOT;  Surgeon: Caroline More, DPM;  Location: [facility];  Service: Podiatry;  Laterality: Left;   IRRIGATION AND DEBRIDEMENT FOOT Left 02/24/2020   Procedure: IRRIGATION AND DEBRIDEMENT FOOT;  Surgeon: Caroline More, DPM;  Location: [facility];  Service: Podiatry;  Laterality: Left;   KNEE ARTHROSCOPY     LEFT HEART CATH AND CORS/GRAFTS ANGIOGRAPHY [name] 02/02/2020   Procedure: LEFT HEART CATH AND CORS/GRAFTS ANGIOGRAPHY;  Surgeon: Teodoro Spray, MD;  Location: [place] [facility];  Service: Cardiovascular;  Laterality: [name];   LOWER EXTREMITY ANGIOGRAPHY Left 02/25/2020   Procedure: Lower Extremity Angiography;  Surgeon: Algernon Huxley, MD;  Location: [place] [facility];  Service: Cardiovascular;  Laterality: Left;   LOWER EXTREMITY ANGIOGRAPHY Left 01/04/2021   Procedure: LOWER EXTREMITY ANGIOGRAPHY;  Surgeon: Algernon Huxley, MD;  Location: [place] [facility];  Service: Cardiovascular;  Laterality: Left;   METATARSAL HEAD EXCISION Left 01/10/2021   Procedure: METATARSAL HEAD EXCISION - LEFT 5th;  Surgeon: Caroline More, DPM;  Location: [facility];  Service: Podiatry;  Laterality: Left;   PERIPHERAL VASCULAR  CATHETERIZATION Right 01/24/2016   Procedure: Lower Extremity Angiography;  Surgeon: Katha Cabal, MD;  Location: [place] [facility];  Service: Cardiovascular;  Laterality: Right;   PERIPHERAL VASCULAR CATHETERIZATION Right 01/25/2016   Procedure: Lower Extremity Angiography;  Surgeon: Katha Cabal, MD;  Location: [place] [facility];  Service: Cardiovascular;  Laterality: Right;   TOE AMPUTATION     TONSILLECTOMY  Social History:   reports that he quit smoking about 6 years ago. His smoking use included cigarettes. He has a 22.50 pack-year smoking history. He has never used smokeless tobacco. He reports that he does not currently use alcohol after a past usage of about 3.0 standard drinks of alcohol per week. He reports that he does not use drugs.   Family History:  His family history includes Alcohol abuse in his father; Coronary artery disease in his father; Heart failure in his brother; Hypertension in his father.   Allergies Allergies  Allergen Reactions   Penicillins Other (See Comments)    Happened at 65 years old and pt. stated he passed out  He has tolerated amoxicillin/clavulanate and ampicillin/sulbactam   Statins     Other reaction(s): Muscle Pain Causes legs to ache per pt   Metformin And Related Diarrhea     Home Medications  Prior to Admission medications   Medication Sig Start Date End Date Taking? Authorizing Provider  amiodarone (PACERONE) 200 MG tablet Take 200 mg by mouth daily.   Yes [provider]  apixaban (ELIQUIS) 5 MG TABS tablet Take 1 tablet (5 mg total) by mouth 2 (two) times daily. 01/26/16  Yes Stegmayer, Janalyn Harder, PA-C  aspirin EC 81 MG EC tablet Take 1 tablet (81 mg total) by mouth daily. Swallow whole. 05/17/21  Yes Dessa Phi, DO  budesonide (PULMICORT) 0.5 MG/2ML nebulizer solution Take 2 mLs (0.5 mg total) by nebulization 2 (two) times daily. 11/10/21  Yes Emeterio Reeve, DO  Cholecalciferol 25 MCG (1000 UT) tablet  Take 1,000 Units by mouth daily.   Yes [provider]  Cyanocobalamin (VITAMIN B-12) 5000 MCG TBDP Take 10,000 mcg by mouth daily.   Yes [provider]  diltiazem (CARDIZEM SR) 120 MG 12 hr capsule Take 1 capsule (120 mg total) by mouth every 12 (twelve) hours. 11/10/21  Yes Emeterio Reeve, DO  ezetimibe (ZETIA) 10 MG tablet Take 10 mg by mouth at bedtime.   Yes [provider]  ferrous sulfate 325 (65 FE) MG tablet Take 325 mg by mouth daily with breakfast.   Yes [provider]  finasteride (PROSCAR) 5 MG tablet Take 1 tablet (5 mg total) by mouth daily. 11/11/21  Yes Emeterio Reeve, DO  furosemide (LASIX) 20 MG tablet Take 2 tablets (40 mg total) by mouth daily. 02/15/22  Yes Fritzi Mandes, MD  HUMALOG 100 UNIT/ML injection Inject 15 Units into the skin 3 (three) times daily with meals. >200 02/05/22  Yes [provider]  insulin glargine-yfgn (SEMGLEE) 100 UNIT/ML injection Inject 0.25 mLs (25 Units total) into the skin at bedtime. Patient taking differently: Inject 0-25 Units into the skin at bedtime. 11/10/21  Yes Emeterio Reeve, DO  levothyroxine (SYNTHROID) 25 MCG tablet Take 1 tablet (25 mcg total) by mouth daily at 6 (six) AM. 11/11/21  Yes Emeterio Reeve, DO  Multiple Vitamins-Minerals (MULTIVITAMIN WITH MINERALS) tablet Take 1 tablet by mouth daily.   Yes [provider]  nitrofurantoin, macrocrystal-monohydrate, (MACROBID) 100 MG capsule Take 100 mg by mouth daily. 03/13/22 06/11/22 Yes [provider]  nystatin (MYCOSTATIN/NYSTOP) powder Apply topically 2 (two) times daily. 11/10/21  Yes Emeterio Reeve, DO  primidone (MYSOLINE) 250 MG tablet Take 250 mg by mouth 2 (two) times daily. 01/23/20  Yes [provider]  risperiDONE (RISPERDAL) 0.5 MG tablet Take 1 tablet (0.5 mg total) by mouth 2 (two) times daily. 11/10/21  Yes Emeterio Reeve, DO  tamsulosin (FLOMAX) 0.4 MG CAPS  capsule Take 0.4 mg by mouth  daily.   Yes [provider]  vitamin C (ASCORBIC ACID) 500 MG tablet Take 500 mg by mouth daily.   Yes [provider]  acetaminophen (TYLENOL) 325 MG tablet Take 2 tablets (650 mg total) by mouth every 6 (six) hours as needed for mild pain (or Fever >/= 101). 11/10/21   Emeterio Reeve, DO  insulin aspart (NOVOLOG) 100 UNIT/ML injection Inject 0-20 Units into the skin 3 (three) times daily with meals. Patient not taking: Reported on 03/15/2022 11/10/21   Emeterio Reeve, DO  metFORMIN (GLUCOPHAGE) 1000 MG tablet Take 1,000 mg by mouth 2 (two) times daily.  Patient not taking: Reported on 03/15/2022 12/02/17   [provider]  traMADol (ULTRAM) 50 MG tablet Take 1 tablet (50 mg total) by mouth daily as needed for moderate pain. 08/11/21   Fritzi Mandes, MD       [facility] Alec Credeur MD ([place]) FOR [place] [facility]

## 2022-03-17 NOTE — Progress Notes (Signed)
Subjective: [name] is responding to medical therapy for his sepsis with associated right perinephric hematoma with possible emphysematous pyelonephritis.  He reports minimal pain.  ROS:  Review of Systems  Constitutional:  Negative for chills and fever.  Respiratory:  Negative for sputum production.   Cardiovascular:  Negative for chest pain.    Anti-infectives: Anti-infectives (From admission, onward)    Start     Dose/Rate Route Frequency Ordered Stop   03/16/22 1500  levofloxacin (LEVAQUIN) IVPB 750 mg  Status:  Discontinued        750 mg 100 mL/hr over 90 Minutes Intravenous Every 48 hours 03/16/22 1408 03/16/22 1415   03/16/22 1200  cefTRIAXone (ROCEPHIN) 2 g in sodium chloride 0.9 % 100 mL IVPB        2 g 200 mL/hr over 30 Minutes Intravenous Every 24 hours 03/16/22 0507     03/16/22 0300  ceFEPIme (MAXIPIME) 2 g in sodium chloride 0.9 % 100 mL IVPB  Status:  Discontinued        2 g 200 mL/hr over 30 Minutes Intravenous Every 12 hours 03/15/22 2030 03/16/22 0505   03/15/22 2200  ceFEPIme (MAXIPIME) 1 g in sodium chloride 0.9 % 100 mL IVPB  Status:  Discontinued        1 g 200 mL/hr over 30 Minutes Intravenous Every 8 hours 03/15/22 2011 03/15/22 2029   03/15/22 2200  vancomycin variable dose per unstable renal function (pharmacist dosing)         Does not apply See admin instructions 03/15/22 2032     03/15/22 1630  vancomycin (VANCOREADY) IVPB 1500 mg/300 mL       See Hyperspace for full Linked Orders Report.   1,500 mg 150 mL/hr over 120 Minutes Intravenous  Once 03/15/22 1526 03/15/22 2104   03/15/22 1530  vancomycin (VANCOCIN) IVPB 1000 mg/200 mL premix       See Hyperspace for full Linked Orders Report.   1,000 mg 200 mL/hr over 60 Minutes Intravenous  Once 03/15/22 1526 03/15/22 1736   03/15/22 1515  ceFEPIme (MAXIPIME) 2 g in sodium chloride 0.9 % 100 mL IVPB        2 g 200 mL/hr over 30 Minutes Intravenous  Once 03/15/22 1512 03/15/22 1614   03/15/22 1515   vancomycin (VANCOCIN) IVPB 1000 mg/200 mL premix  Status:  Discontinued        1,000 mg 200 mL/hr over 60 Minutes Intravenous  Once 03/15/22 1512 03/15/22 1524       Current Facility-Administered Medications  Medication Dose Route Frequency Provider Last Rate Last Admin   0.9 %  sodium chloride infusion (Manually program via Guardrails IV Fluids)   Intravenous Once Sharion Settler, NP       0.9 %  sodium chloride infusion   Intravenous Continuous Sharion Settler, NP   Stopped at 03/16/22 0107   0.9 %  sodium chloride infusion  250 mL Intravenous Continuous Rust-Chester, Huel Cote, NP   Held at 03/16/22 0018   0.9 %  sodium chloride infusion   Intravenous Continuous Bradly Bienenstock, NP 50 mL/hr at 03/17/22 0854 New Bag at 03/17/22 0854   amiodarone (PACERONE) tablet 200 mg  200 mg Oral Daily Hugelmeyer, Alexis, DO   200 mg at 03/17/22 1275   ascorbic acid (VITAMIN C) tablet 500 mg  500 mg Oral Daily Hugelmeyer, Alexis, DO   500 mg at 03/17/22 0834   budesonide (PULMICORT) nebulizer solution 0.5 mg  0.5 mg Nebulization BID Hugelmeyer, Alexis, DO  0.5 mg at 03/16/22 1803   cefTRIAXone (ROCEPHIN) 2 g in sodium chloride 0.9 % 100 mL IVPB  2 g Intravenous Q24H Sharion Settler, NP   Stopped at 03/16/22 1321   Chlorhexidine Gluconate Cloth 2 % PADS 6 each  6 each Topical Daily Hugelmeyer, Alexis, DO   6 each at 03/15/22 2155   cholecalciferol (VITAMIN D3) 25 MCG (1000 UNIT) tablet 1,000 Units  1,000 Units Oral Daily Hugelmeyer, Alexis, DO   1,000 Units at 03/17/22 0834   cyanocobalamin (VITAMIN B12) tablet 1,000 mcg  1,000 mcg Oral Daily Hugelmeyer, Alexis, DO   1,000 mcg at 03/17/22 0833   dextrose 50 % solution 0-50 mL  0-50 mL Intravenous PRN Hugelmeyer, Alexis, DO       ezetimibe (ZETIA) tablet 10 mg  10 mg Oral Daily Hugelmeyer, Alexis, DO   10 mg at 03/17/22 0834   ferrous sulfate tablet 325 mg  325 mg Oral Q breakfast Hugelmeyer, Alexis, DO   325 mg at 03/17/22 0835   finasteride  (PROSCAR) tablet 5 mg  5 mg Oral Daily Hugelmeyer, Alexis, DO   5 mg at 03/17/22 0834   insulin aspart (novoLOG) injection 0-20 Units  0-20 Units Subcutaneous Q4H Rust-Chester, Huel Cote, NP   4 Units at 03/17/22 0835   insulin detemir (LEVEMIR) injection 15 Units  15 Units Subcutaneous BID Rust-Chester, Huel Cote, NP   15 Units at 03/17/22 0834   levothyroxine (SYNTHROID) tablet 25 mcg  25 mcg Oral Q0600 Hugelmeyer, Alexis, DO       multivitamin with minerals tablet 1 tablet  1 tablet Oral Daily Hugelmeyer, Alexis, DO   1 tablet at 03/17/22 0834   mupirocin ointment (BACTROBAN) 2 % 1 Application  1 Application Nasal BID Rust-Chester, Huel Cote, NP   1 Application at 65/46/50 0849   norepinephrine (LEVOPHED) 16 mg in 222m (0.064 mg/mL) premix infusion  0-40 mcg/min Intravenous Titrated KDarel HongD, NP 3.75 mL/hr at 03/16/22 1716 4 mcg/min at 03/16/22 1716   pantoprazole (PROTONIX) injection 40 mg  40 mg Intravenous Q24H Rust-Chester, Britton L, NP   40 mg at 03/17/22 0630   primidone (MYSOLINE) tablet 250 mg  250 mg Oral BID Hugelmeyer, Alexis, DO   250 mg at 03/17/22 0835   tamsulosin (FLOMAX) capsule 0.4 mg  0.4 mg Oral Daily Hugelmeyer, Alexis, DO   0.4 mg at 03/17/22 0834   traMADol (ULTRAM) tablet 50 mg  50 mg Oral Daily PRN Hugelmeyer, Alexis, DO       vancomycin variable dose per unstable renal function (pharmacist dosing)   Does not apply See admin instructions GLorin Picket RPH       vasopressin (PITRESSIN) 20 Units in sodium chloride 0.9 % 100 mL infusion-*FOR SHOCK*  0-0.04 Units/min Intravenous Continuous Rust-Chester, Britton L, NP 9 mL/hr at 03/17/22 0719 0.03 Units/min at 03/17/22 0719     Objective: Vital signs in last 24 hours: Temp:  [98.1 F (36.7 C)-99.6 F (37.6 C)] 99.3 F (37.4 C) (11/18 0800) Pulse Rate:  [65-165] 90 (11/18 0830) Resp:  [14-22] 19 (11/18 0830) BP: (95-130)/(45-82) 124/64 (11/18 0830) SpO2:  [89 %-100 %] 100 % (11/18 0830) Arterial Line  BP: (85-162)/(28-69) 142/51 (11/18 0830)  Intake/Output from previous day: 11/17 0701 - 11/18 0700 In: 2765.1 [I.V.:2325.1; Blood:340; IV Piggyback:100] Out: 23546[Urine:2300] Intake/Output this shift: No intake/output data recorded.   Physical Exam Vitals reviewed.  Constitutional:      Appearance: Normal appearance. He is obese.  Cardiovascular:  Rate and Rhythm: Normal rate.  Pulmonary:     Effort: Pulmonary effort is normal. No respiratory distress.  Abdominal:     Palpations: Abdomen is soft.     Tenderness: There is abdominal tenderness (moderate in RUQ and flank.).  Neurological:     General: No focal deficit present.     Mental Status: He is alert and oriented to person, place, and time.     Lab Results:  Recent Labs    03/16/22 2007 03/17/22 0318  WBC 7.0 7.3  HGB 7.6* 8.1*  HCT 23.3* 24.3*  PLT 104* 104*   BMET Recent Labs    03/16/22 1800 03/17/22 0318  NA 131* 133*  K 3.9 3.8  CL 97* 100  CO2 22 24  GLUCOSE 268* 226*  BUN 55* 53*  CREATININE 2.24* 2.06*  CALCIUM 7.9* 8.0*   PT/INR Recent Labs    03/15/22 1519 03/16/22 0703  LABPROT 18.5* 16.6*  INR 1.6* 1.4*   ABG Recent Labs    03/15/22 1512 03/15/22 2359  PHART  --  7.4  HCO3 25.4 26.0    Studies/Results: CT ABDOMEN PELVIS WO CONTRAST  Result Date: 03/16/2022 CLINICAL DATA:  66 year old male with history of sepsis. Suspected pyelonephritis. EXAM: CT ABDOMEN AND PELVIS WITHOUT CONTRAST TECHNIQUE: Multidetector CT imaging of the abdomen and pelvis was performed following the standard protocol without IV contrast. RADIATION DOSE REDUCTION: This exam was performed according to the departmental dose-optimization program which includes automated exposure control, adjustment of the mA and/or kV according to patient size and/or use of iterative reconstruction technique. COMPARISON:  No priors. FINDINGS: Lower chest: Small right pleural effusion with passive subsegmental atelectasis in  the dependent portions of the right lower lobe. Cardiomegaly. Calcifications of the mitral annulus and mitral subvalvular apparatus. Atherosclerotic calcifications in the right coronary artery. Hepatobiliary: Liver has a nodular contour, suggesting underlying cirrhosis. There is also heterogeneous low attenuation throughout the hepatic parenchyma, indicative of a background of hepatic steatosis. No discrete cystic or solid hepatic lesions are confidently identified upon today's noncontrast CT examination. A small amount of amorphous increased attenuation is noted lying dependently in the gallbladder, likely to reflect biliary sludge and/or tiny noncalcified gallstones. Gallbladder is not distended. No surrounding inflammatory changes to suggest an acute cholecystitis at this time. Pancreas: No definite pancreatic mass or peripancreatic fluid collections or inflammatory changes are noted on today's noncontrast CT examination. Spleen: Unremarkable. Adrenals/Urinary Tract: There is a large amount of high attenuation material adjacent to the right kidney, much of which appears confined within Gerota's fascia and is compatible with a large subcapsular hematoma, measuring up to 4.8 x 9.0 x 13.8 cm (axial image 36 of series 2 and sagittal image 56 of series 6). This compresses the adjacent kidney, displacing it slightly anteriorly. In addition, there is a large amount of gas in the inferolateral aspect of the right kidney extending into the adjacent subcapsular hematoma. In the posterolateral aspect of the interpolar region of the left kidney (axial image 39 of series 2) there is an exophytic 1.6 cm low-attenuation lesion, incompletely characterized on today's noncontrast CT examination, but statistically likely to represent cysts. No hydroureteronephrosis. Urinary bladder is largely decompressed, but otherwise unremarkable in appearance. Stomach/Bowel: Unenhanced appearance of the stomach is normal. There is no pathologic  dilatation of small bowel or colon. A few scattered colonic diverticuli are noted, without definite focal surrounding inflammatory changes to clearly indicate an acute diverticulitis at this time. Normal appendix. Vascular/Lymphatic: Extensive atherosclerosis of the abdominal  aorta and pelvic vasculature. No definite lymphadenopathy noted in the abdomen or pelvis. Reproductive: Prostate gland and seminal vesicles are grossly unremarkable in appearance. Other: Large amount of high attenuation fluid in the right perinephric space extending caudally throughout the retroperitoneum dissecting all the way down to the low anatomic pelvis and right pelvic sidewall. Small amount of adjacent intermediate to high attenuation fluid in the right pericolic gutter as well. Small volume of left perinephric fluid, low-attenuation. No pneumoperitoneum. Musculoskeletal: There are no aggressive appearing lytic or blastic lesions noted in the visualized portions of the skeleton. IMPRESSION: 1. Large subcapsular hematoma associated with the right kidney causing compression of the right kidney; clinical correlation for signs and symptoms of Page kidney is recommended. 2. Notably, there is a large amount of gas in the inferolateral aspect of the right kidney, extending into the overlying subcapsular hematoma. Findings are concerning for emphysematous pyelonephritis. Correlation with urinalysis is recommended. 3. Hemorrhage from the right subcapsular hematoma has extended outward into the right retroperitoneum and adjacent peritoneal spaces, as above. 4. Small amount of intermediate attenuation material lying dependently in the gallbladder, likely to reflect biliary sludge and/or small gallstones. No findings to suggest an acute cholecystitis at this time. 5. Hepatic steatosis with evidence of hepatic cirrhosis, as above. Colonic diverticulosis without evidence of acute diverticulitis at this time. 6. Aortic atherosclerosis, as well as  right coronary artery disease. 7. Small right pleural effusion with areas of dependent atelectasis in the right lung base. 8. Additional incidental findings, as above. Electronically Signed   By: Vinnie Langton M.D.   On: 03/16/2022 06:18   DG Chest 1 View  Result Date: 03/16/2022 CLINICAL DATA:  Encounter for central line placement EXAM: CHEST  1 VIEW COMPARISON:  Yesterday FINDINGS: Left IJ line with tip at the SVC origin. No pneumothorax or new mediastinal widening. There is cardiomegaly and aortic tortuosity accentuated by rotation. Congested appearance of vessels. Prior CABG. IMPRESSION: 1. Left IJ line with tip near the upper SVC.  No pneumothorax. 2. Increased vascular congestion. Electronically Signed   By: Jorje Guild M.D.   On: 03/16/2022 05:36   US Abdomen Limited RUQ (LIVER/GB)  Result Date: 03/15/2022 CLINICAL DATA:  Elevated bilirubin. EXAM: ULTRASOUND ABDOMEN LIMITED RIGHT UPPER QUADRANT COMPARISON:  None Available. FINDINGS: Gallbladder: There is gallstone. No gallbladder wall thickening or pericholecystic fluid. Negative sonographic Murphy's sign. Common bile duct: Diameter: 4 mm Liver: The liver is unremarkable. Portal vein is patent on color Doppler imaging with normal direction of blood flow towards the liver. Other: None. IMPRESSION: Cholelithiasis without sonographic evidence of acute cholecystitis. Electronically Signed   By: Anner Crete M.D.   On: 03/15/2022 21:16   DG Chest Port 1 View  Result Date: 03/15/2022 CLINICAL DATA:  Questionable sepsis. Evaluate for abnormality. Hyperglycemia. EXAM: PORTABLE CHEST 1 VIEW COMPARISON:  AP chest 02/13/2022, 10/24/2021 FINDINGS: Status post median sternotomy. Cardiac silhouette is again moderately enlarged. Tortuous thoracic aorta is similar to prior. Improved aeration of bilateral lungs with apparent resolution of the prior interstitial thickening. No focal airspace opacity to indicate pneumonia. No pleural effusion or  pneumothorax. Moderate multilevel disc space narrowing and bridging osteophytes of the thoracic spine. IMPRESSION: Stable moderate cardiomegaly. Improved aeration of bilateral lungs with apparent resolution of the prior interstitial pulmonary edema. No focal airspace opacity to indicate pneumonia. Electronically Signed   By: Yvonne Kendall M.D.   On: 03/15/2022 15:56     Assessment and Plan: GNR sepsis with right perinephric hematoma with possible  empysematous pyelonephritis.   He is improving clinically on antibiotic therapy.  He will need rescanning in the next 2-3 days to reassess the status of the kidney.  This could be done more urgently if his condition deteriorates.        LOS: 2 days    Granger Chui 11/18/2023Patient ID: Alec Less., male   DOB: [birth year], 66 y.o.   MRN: [id number]

## 2022-03-17 NOTE — Progress Notes (Signed)
PHARMACIST - PHYSICIAN COMMUNICATION  CONCERNING: IV to Oral Route Change Policy  RECOMMENDATION: This patient is receiving pantoprazole by the intravenous route.  Based on criteria approved by the Pharmacy and Therapeutics Committee, the intravenous medication(s) is/are being converted to the equivalent oral dose form(s).   DESCRIPTION: These criteria include: The patient is eating (either orally or via tube) and/or has been taking other orally administered medications for a least 24 hours The patient has no evidence of active gastrointestinal bleeding or impaired GI absorption (gastrectomy, short bowel, patient on TNA or NPO).  If you have questions about this conversion, please contact the [place], [facility] 03/17/2022 11:55 AM

## 2022-03-18 DIAGNOSIS — S37001A Unspecified injury of right kidney, initial encounter: Secondary | ICD-10-CM

## 2022-03-18 DIAGNOSIS — A419 Sepsis, unspecified organism: Secondary | ICD-10-CM

## 2022-03-18 DIAGNOSIS — N390 Urinary tract infection, site not specified: Secondary | ICD-10-CM | POA: Diagnosis not present

## 2022-03-18 LAB — CBC
HCT: 23.4 % — ABNORMAL LOW (ref 39.0–52.0)
Hemoglobin: 7.9 g/dL — ABNORMAL LOW (ref 13.0–17.0)
MCH: 30.6 pg (ref 26.0–34.0)
MCHC: 33.8 g/dL (ref 30.0–36.0)
MCV: 90.7 fL (ref 80.0–100.0)
Platelets: 122 10*3/uL — ABNORMAL LOW (ref 150–400)
RBC: 2.58 MIL/uL — ABNORMAL LOW (ref 4.22–5.81)
RDW: 15.8 % — ABNORMAL HIGH (ref 11.5–15.5)
WBC: 6.8 10*3/uL (ref 4.0–10.5)
nRBC: 0 % (ref 0.0–0.2)

## 2022-03-18 LAB — GLUCOSE, CAPILLARY
Glucose-Capillary: 191 mg/dL — ABNORMAL HIGH (ref 70–99)
Glucose-Capillary: 166 mg/dL — ABNORMAL HIGH (ref 70–99)
Glucose-Capillary: 139 mg/dL — ABNORMAL HIGH (ref 70–99)
Glucose-Capillary: 163 mg/dL — ABNORMAL HIGH (ref 70–99)
Glucose-Capillary: 144 mg/dL — ABNORMAL HIGH (ref 70–99)
Glucose-Capillary: 194 mg/dL — ABNORMAL HIGH (ref 70–99)

## 2022-03-18 LAB — PHOSPHORUS: Phosphorus: 2.4 mg/dL — ABNORMAL LOW (ref 2.5–4.6)

## 2022-03-18 LAB — BASIC METABOLIC PANEL
Anion gap: 7 (ref 5–15)
BUN: 42 mg/dL — ABNORMAL HIGH (ref 8–23)
CO2: 24 mmol/L (ref 22–32)
Calcium: 8.2 mg/dL — ABNORMAL LOW (ref 8.9–10.3)
Chloride: 102 mmol/L (ref 98–111)
Creatinine, Ser: 1.45 mg/dL — ABNORMAL HIGH (ref 0.61–1.24)
GFR, Estimated: 53 mL/min — ABNORMAL LOW (ref >60)
Glucose, Bld: 199 mg/dL — ABNORMAL HIGH (ref 70–99)
Potassium: 3.7 mmol/L (ref 3.5–5.1)
Sodium: 133 mmol/L — ABNORMAL LOW (ref 135–145)

## 2022-03-18 LAB — CULTURE, BLOOD (ROUTINE X 2): Special Requests: ADEQUATE

## 2022-03-18 LAB — MAGNESIUM: Magnesium: 1.9 mg/dL (ref 1.7–2.4)

## 2022-03-18 LAB — PROCALCITONIN: Procalcitonin: 3.48 ng/mL

## 2022-03-18 MED ORDER — CEFAZOLIN SODIUM-DEXTROSE 2-4 GM/100ML-% IV SOLN
2.0000 g | Freq: Three times a day (TID) | INTRAVENOUS | Status: AC
  Administered 2022-03-18 – 2022-03-22 (×14): 2 g via INTRAVENOUS
  Filled 2022-03-18 (×15): qty 100

## 2022-03-18 MED ORDER — ZIPRASIDONE MESYLATE 20 MG IM SOLR
INTRAMUSCULAR | Status: AC
  Filled 2022-03-18: qty 20

## 2022-03-18 MED ORDER — CEFAZOLIN SODIUM-DEXTROSE 1-4 GM/50ML-% IV SOLN
1.0000 g | Freq: Three times a day (TID) | INTRAVENOUS | Status: DC
  Filled 2022-03-18: qty 50

## 2022-03-18 MED ORDER — ZIPRASIDONE MESYLATE 20 MG IM SOLR
10.0000 mg | INTRAMUSCULAR | Status: AC
  Administered 2022-03-18: 10 mg via INTRAMUSCULAR

## 2022-03-18 MED ORDER — ZIPRASIDONE MESYLATE 20 MG IM SOLR: 10.0000 mg | Freq: Once | INTRAMUSCULAR | Status: DC

## 2022-03-18 NOTE — Progress Notes (Signed)
At approximately 1530 patient suddenly awakened, confused, and attempting to get out of bed to go home.  Patient was agreeable to sitting on side of bed and talked with patient to help decrease his anxiety and worries.  Dr. Dillard Cannon notified of confusion and safety issues, Dr. Dillard Cannon ordered IM geodone stat.  Medication administered to patient who approximately 10-15 minutes later agreeable to laying down in bed.

## 2022-03-18 NOTE — Consult Note (Signed)
PHARMACY CONSULT NOTE - FOLLOW UP  Pharmacy Consult for Electrolyte Monitoring and Replacement   Recent Labs: Potassium (mmol/L)  Date Value  03/18/2022 3.7  07/12/2011 4.4   Magnesium (mg/dL)  Date Value  03/18/2022 1.9   Calcium (mg/dL)  Date Value  03/18/2022 8.2 (L)   Calcium, Total (mg/dL)  Date Value  07/12/2011 8.8   Albumin (g/dL)  Date Value  03/15/2022 3.2 (L)   Phosphorus (mg/dL)  Date Value  03/18/2022 2.4 (L)   Sodium (mmol/L)  Date Value  03/18/2022 133 (L)  07/12/2011 141   Assessment: Patient is a 66 y/o M with medical history including Afib on Eliquis, CHF, COPD, CAD, DM, HTN, HLD who is admitted with urosepsis / Klebsiella bacteremia. Pharmacy consulted to assist with electrolyte monitoring and replacement as indicated.  AKI resolving  Goal of Therapy:  Electrolytes within normal limits  Plan:  --Na 133, continue to monitor --Phos 2.4, mildly low; continue to monitor for now --Follow-up electrolytes with AM labs tomorrow  Benita Gutter 03/18/2022 7:24 AM

## 2022-03-18 NOTE — Progress Notes (Signed)
Pulmonary and Critical care    NAME:  [name]., MRN:  [id number], DOB:  06-28-1955, LOS: 3 ADMISSION DATE:  03/15/2022,     History of Present Illness:   The patient is a 66 year old male with history of asthma COPD CHF diabetes prior history of left BKA recent hospitalization for fall and atrial fibrillation. The patient came to the emergency room on November 16 for altered mental status confusion and hyperglycemia at home.  The patient had a fall at home.  Wife was unable to use left to get patient into bed. In the emergency room patient had blood pressure 73/43 pulse 108 temperature 98.1 F. Lactic acid was 2.2 sodium 125 bicarbonate 21 creatinine 2.3 WBC count 11.4 hemoglobin 8.8 platelet count 124. Patient was noted to have atrial flutter in the ER.  The patient was started on insulin infusion and was started on empiric antibiotics for urinary tract infection with sepsis protocol. The patient had CT of the abdomen done which showed large subcapsular hematoma of the right kidney as well as findings of large amount of gas concerning for emphysematous pyelonephritis.  Blood cultures are also positive for gram-negative rods. The patient has been evaluated by urology, not a candidate for percutaneous nephrostomy due to recent Eliquis use.  Will be closely monitored if ureteral stenting is needed Today November 18.  Vasopressor requirements improved.  Patient was off vasopressors overnight however this morning due to hypotension vasopressors were resumed.  Blood cultures show Enterobacter and Klebsiella pneumonia urine culture also shows Klebsiella Nasal MRSA was positive patient reports hungry denies any back pain no nausea vomiting denies shortness of breath more awake and alert and communicative this morning.  Hyponatremia improved creatinine also improved.   November 19.  Improvement overall not requiring vasopressors.  Denies back pain.  Reports poor appetite.  No nausea  vomiting.  Had 3-4 bowel movements.  Has been seen by urology service and will have CT tomorrow.  Patient will be transferred to hospitalist service         03/18/2022   11:00 AM 03/18/2022   10:41 AM 03/18/2022   10:30 AM  Vitals with BMI  Systolic 382  505  Diastolic 53  87  Pulse 397 109 55        Examination: General: Awake no distress HENT: Pallor present  lungs: Air entry was heard bilaterally no wheezing. Cardiovascular: S1-S2 heard no murmurs Abdomen: Nontender Extremities: No edema or cyanosis Neuro: Oriented to place person following commands moving extremities    Assessment & Plan:  1.  Septic shock, with bacteremia and urinary tract infection emphysematous pyelonephritis. 2.  Acute kidney injury. 3.  Type 2 diabetes with hyperglycemia. 4.  Metabolic encephalopathy. 5.  Subcapsular hematoma right kidney. 6.  Right pleural effusion with atelectasis. 7.  History of COPD and CHF 8.  Hepatic steatosis 9.  Hypothyroidism 10.  Atrial flutter Plan. Continue on ceftriaxone.   Transfer to hospitalist service CT abdomen and pelvis without contrast tomorrow.. Continue sliding scale insulin with a long-acting Resume p.o. medications including amiodarone Follow blood cultures.   Labs   CBC: Recent Labs  Lab 03/15/22 1519 03/16/22 1320 03/16/22 2007 03/17/22 0318 03/18/22 0422  WBC 11.4*  --  7.0 7.3 6.8  NEUTROABS 9.8*  --   --   --   --   HGB 8.8* 8.2* 7.6* 8.1* 7.9*  HCT 27.1* 24.7* 23.3* 24.3* 23.4*  MCV 92.2  --  90.3 90.7 90.7  PLT 124*  --  104* 104* 122*    Basic Metabolic Panel: Recent Labs  Lab 03/15/22 2109 03/16/22 0257 03/16/22 0703 03/16/22 1800 03/17/22 0318 03/18/22 0422  NA 127* 129* 129* 131* 133* 133*  K 3.7 3.6 3.9 3.9 3.8 3.7  CL 92* 95* 95* 97* 100 102  CO2 '24 24 24 22 24 24  '$ GLUCOSE 485* 224* 191* 268* 226* 199*  BUN 50* 54* 55* 55* 53* 42*  CREATININE 2.23* 2.29* 2.35* 2.24* 2.06* 1.45*  CALCIUM 8.0* 8.0* 8.3* 7.9*  8.0* 8.2*  MG 1.9  --   --   --  1.9 1.9  PHOS 3.9  --   --   --  4.0 2.4*   GFR: Estimated Creatinine Clearance: 77.5 mL/min (A) (by C-G formula based on SCr of 1.45 mg/dL (H)). Recent Labs  Lab 03/15/22 1519 03/15/22 1720 03/15/22 2109 03/15/22 2241 03/16/22 0045 03/16/22 2007 03/17/22 0318 03/18/22 0422  PROCALCITON  --   --   --   --  14.20  --  7.33 3.48  WBC 11.4*  --   --   --   --  7.0 7.3 6.8  LATICACIDVEN 2.2* 1.9 2.1* 1.7  --   --   --   --     Liver Function Tests: Recent Labs  Lab 03/15/22 1519  AST 26  ALT 19  ALKPHOS 85  BILITOT 2.1*  PROT 6.7  ALBUMIN 3.2*   Recent Labs  Lab 03/16/22 0257  LIPASE 22   No results for input(s): "AMMONIA" in the last 168 hours.  ABG    Component Value Date/Time   PHART 7.4 03/15/2022 2359   PCO2ART 42 03/15/2022 2359   PO2ART 97 03/15/2022 2359   HCO3 26.0 03/15/2022 2359   ACIDBASEDEF 0.8 03/15/2022 1512   O2SAT 98.6 03/15/2022 2359     Coagulation Profile: Recent Labs  Lab 03/15/22 1519 03/16/22 0703  INR 1.6* 1.4*    Cardiac Enzymes: Recent Labs  Lab 03/15/22 1519  CKTOTAL 212    HbA1C: Hgb A1c MFr Bld  Date/Time Value Ref Range Status  03/15/2022 09:09 PM 8.9 (H) 4.8 - 5.6 % Final    Comment:    (NOTE)         Prediabetes: 5.7 - 6.4         Diabetes: >6.4         Glycemic control for adults with diabetes: <7.0   08/05/2021 04:27 AM 10.2 (H) 4.8 - 5.6 % Final    Comment:    (NOTE) Pre diabetes:          5.7%-6.4%  Diabetes:              >6.4%  Glycemic control for   <7.0% adults with diabetes     CBG: Recent Labs  Lab 03/17/22 1947 03/17/22 2328 03/18/22 0417 03/18/22 0945 03/18/22 1148  GLUCAP 243* 232* 191* 166* 139*      Review of Systems:   Denies chest pain or abdominal pain reports poor appetite denies shortness of breath   Past Medical History:  He,  has a past medical history of Arrhythmia, Asthma, CHF (congestive heart failure) ([place]), COPD (chronic  obstructive pulmonary disease) ([place]), Coronary artery disease, Depression, Diabetes mellitus without complication ([place]), Gout, History anabolic steroid use, Hyperlipidemia, Hypertension, Hypogonadism in male, Morbid obesity ([place]), Myocardial infarction ([place]), Peripheral vascular disease ([place]), Perirectal abscess, Pleurisy, Sleep apnea, and Varicella.   Surgical History:   Past Surgical History:  Procedure Laterality Date   ABDOMINAL AORTIC  ANEURYSM REPAIR     ACHILLES TENDON SURGERY Left 01/10/2021   Procedure: ACHILLES LENGTHENING/KIDNER;  Surgeon: Caroline More, DPM;  Location: [facility];  Service: Podiatry;  Laterality: Left;   AMPUTATION Left 10/03/2021   Procedure: AMPUTATION BELOW KNEE;  Surgeon: Katha Cabal, MD;  Location: [facility];  Service: Vascular;  Laterality: Left;   AMPUTATION TOE Right 02/10/2016   Procedure: AMPUTATION TOE 3RD TOE;  Surgeon: Samara Deist, DPM;  Location: [facility];  Service: Podiatry;  Laterality: Right;   AMPUTATION TOE Left 02/24/2020   Procedure: AMPUTATION TOE;  Surgeon: Caroline More, DPM;  Location: [facility];  Service: Podiatry;  Laterality: Left;   APPLICATION OF WOUND VAC Left 02/29/2020   Procedure: APPLICATION OF WOUND VAC;  Surgeon: Caroline More, DPM;  Location: [facility];  Service: Podiatry;  Laterality: Left;   CARDIOVERSION [name] 02/13/2022   Procedure: CARDIOVERSION;  Surgeon: Corey Skains, MD;  Location: [facility];  Service: Cardiovascular;  Laterality: [name];   COLONOSCOPY WITH PROPOFOL [name] 11/18/2015   Procedure: COLONOSCOPY WITH PROPOFOL;  Surgeon: Manya Silvas, MD;  Location: [facility];  Service: Endoscopy;  Laterality: [name];   CORONARY ARTERY BYPASS GRAFT     CORONARY STENT INTERVENTION [name] 02/02/2020   Procedure: CORONARY STENT INTERVENTION;  Surgeon: Isaias Cowman, MD;  Location: [place] [facility];  Service: Cardiovascular;  Laterality: [name];   INCISION AND DRAINAGE Left 08/07/2021   Procedure: INCISION AND  DRAINAGE-Partial Calcanectomy;  Surgeon: Caroline More, DPM;  Location: [facility];  Service: Podiatry;  Laterality: Left;   IRRIGATION AND DEBRIDEMENT FOOT Left 02/29/2020   Procedure: IRRIGATION AND DEBRIDEMENT FOOT;  Surgeon: Caroline More, DPM;  Location: [facility];  Service: Podiatry;  Laterality: Left;   IRRIGATION AND DEBRIDEMENT FOOT Left 02/24/2020   Procedure: IRRIGATION AND DEBRIDEMENT FOOT;  Surgeon: Caroline More, DPM;  Location: [facility];  Service: Podiatry;  Laterality: Left;   KNEE ARTHROSCOPY     LEFT HEART CATH AND CORS/GRAFTS ANGIOGRAPHY [name] 02/02/2020   Procedure: LEFT HEART CATH AND CORS/GRAFTS ANGIOGRAPHY;  Surgeon: Teodoro Spray, MD;  Location: [place] [facility];  Service: Cardiovascular;  Laterality: [name];   LOWER EXTREMITY ANGIOGRAPHY Left 02/25/2020   Procedure: Lower Extremity Angiography;  Surgeon: Algernon Huxley, MD;  Location: [place] [facility];  Service: Cardiovascular;  Laterality: Left;   LOWER EXTREMITY ANGIOGRAPHY Left 01/04/2021   Procedure: LOWER EXTREMITY ANGIOGRAPHY;  Surgeon: Algernon Huxley, MD;  Location: [place] [facility];  Service: Cardiovascular;  Laterality: Left;   METATARSAL HEAD EXCISION Left 01/10/2021   Procedure: METATARSAL HEAD EXCISION - LEFT 5th;  Surgeon: Caroline More, DPM;  Location: [facility];  Service: Podiatry;  Laterality: Left;   PERIPHERAL VASCULAR CATHETERIZATION Right 01/24/2016   Procedure: Lower Extremity Angiography;  Surgeon: Katha Cabal, MD;  Location: [place] [facility];  Service: Cardiovascular;  Laterality: Right;   PERIPHERAL VASCULAR CATHETERIZATION Right 01/25/2016   Procedure: Lower Extremity Angiography;  Surgeon: Katha Cabal, MD;  Location: [place] [facility];  Service: Cardiovascular;  Laterality: Right;   TOE AMPUTATION     TONSILLECTOMY       Social History:   reports that he quit smoking about 6 years ago. His smoking use included cigarettes. He has a 22.50 pack-year smoking history. He has never used  smokeless tobacco. He reports that he does not currently use alcohol after a past usage of about 3.0 standard drinks of alcohol per week. He reports that he does not use drugs.  Family History:  His family history includes Alcohol abuse in his father; Coronary artery disease in his father; Heart failure in his brother; Hypertension in his father.   Allergies Allergies  Allergen Reactions   Penicillins Other (See Comments)    Happened at 66 years old and pt. stated he passed out  He has tolerated amoxicillin/clavulanate and ampicillin/sulbactam   Statins     Other reaction(s): Muscle Pain Causes legs to ache per pt   Metformin And Related Diarrhea     Home Medications  Prior to Admission medications   Medication Sig Start Date End Date Taking? Authorizing Provider  amiodarone (PACERONE) 200 MG tablet Take 200 mg by mouth daily.   Yes [provider]  apixaban (ELIQUIS) 5 MG TABS tablet Take 1 tablet (5 mg total) by mouth 2 (two) times daily. 01/26/16  Yes Stegmayer, Janalyn Harder, PA-C  aspirin EC 81 MG EC tablet Take 1 tablet (81 mg total) by mouth daily. Swallow whole. 05/17/21  Yes Dessa Phi, DO  budesonide (PULMICORT) 0.5 MG/2ML nebulizer solution Take 2 mLs (0.5 mg total) by nebulization 2 (two) times daily. 11/10/21  Yes Emeterio Reeve, DO  Cholecalciferol 25 MCG (1000 UT) tablet Take 1,000 Units by mouth daily.   Yes [provider]  Cyanocobalamin (VITAMIN B-12) 5000 MCG TBDP Take 10,000 mcg by mouth daily.   Yes [provider]  diltiazem (CARDIZEM SR) 120 MG 12 hr capsule Take 1 capsule (120 mg total) by mouth every 12 (twelve) hours. 11/10/21  Yes Emeterio Reeve, DO  ezetimibe (ZETIA) 10 MG tablet Take 10 mg by mouth at bedtime.   Yes [provider]  ferrous sulfate 325 (65 FE) MG tablet Take 325 mg by mouth daily with breakfast.   Yes [provider]  finasteride (PROSCAR) 5 MG tablet Take 1 tablet (5 mg total) by mouth  daily. 11/11/21  Yes Emeterio Reeve, DO  furosemide (LASIX) 20 MG tablet Take 2 tablets (40 mg total) by mouth daily. 02/15/22  Yes Fritzi Mandes, MD  HUMALOG 100 UNIT/ML injection Inject 15 Units into the skin 3 (three) times daily with meals. >200 02/05/22  Yes [provider]  insulin glargine-yfgn (SEMGLEE) 100 UNIT/ML injection Inject 0.25 mLs (25 Units total) into the skin at bedtime. Patient taking differently: Inject 0-25 Units into the skin at bedtime. 11/10/21  Yes Emeterio Reeve, DO  levothyroxine (SYNTHROID) 25 MCG tablet Take 1 tablet (25 mcg total) by mouth daily at 6 (six) AM. 11/11/21  Yes Emeterio Reeve, DO  Multiple Vitamins-Minerals (MULTIVITAMIN WITH MINERALS) tablet Take 1 tablet by mouth daily.   Yes [provider]  nitrofurantoin, macrocrystal-monohydrate, (MACROBID) 100 MG capsule Take 100 mg by mouth daily. 03/13/22 06/11/22 Yes [provider]  nystatin (MYCOSTATIN/NYSTOP) powder Apply topically 2 (two) times daily. 11/10/21  Yes Emeterio Reeve, DO  primidone (MYSOLINE) 250 MG tablet Take 250 mg by mouth 2 (two) times daily. 01/23/20  Yes [provider]  risperiDONE (RISPERDAL) 0.5 MG tablet Take 1 tablet (0.5 mg total) by mouth 2 (two) times daily. 11/10/21  Yes Emeterio Reeve, DO  tamsulosin (FLOMAX) 0.4 MG CAPS capsule Take 0.4 mg by mouth daily.   Yes [provider]  vitamin C (ASCORBIC ACID) 500 MG tablet Take 500 mg by mouth daily.   Yes [provider]  acetaminophen (TYLENOL) 325 MG tablet Take 2 tablets (650 mg total) by mouth every 6 (six) hours as needed for mild pain (or Fever >/= 101). 11/10/21  Emeterio Reeve, DO  insulin aspart (NOVOLOG) 100 UNIT/ML injection Inject 0-20 Units into the skin 3 (three) times daily with meals. Patient not taking: Reported on 03/15/2022 11/10/21   Emeterio Reeve, DO  metFORMIN (GLUCOPHAGE) 1000 MG tablet Take 1,000 mg by mouth 2 (two) times daily.  Patient  not taking: Reported on 03/15/2022 12/02/17   [provider]  traMADol (ULTRAM) 50 MG tablet Take 1 tablet (50 mg total) by mouth daily as needed for moderate pain. 08/11/21   Fritzi Mandes, MD       [facility] Alijah Akram MD ([place]) FOR [place] [facility]

## 2022-03-18 NOTE — Plan of Care (Signed)
Continuing with plan of care. 

## 2022-03-18 NOTE — Inpatient Diabetes Management (Signed)
Inpatient Diabetes Program Recommendations  AACE/ADA: New Consensus Statement on Inpatient Glycemic Control (2015)  Target Ranges:  Prepandial:   less than 140 mg/dL      Peak postprandial:   less than 180 mg/dL (1-2 hours)      Critically ill patients:  140 - 180 mg/dL   Lab Results  Component Value Date   GLUCAP 139 (H) 03/18/2022   HGBA1C 8.9 (H) 03/15/2022    Review of Glycemic Control  Latest Reference Range & Units 03/17/22 19:47 03/17/22 23:28 03/18/22 04:17 03/18/22 09:45 03/18/22 11:48  Glucose-Capillary 70 - 99 mg/dL 243 (H) 232 (H) 191 (H) 166 (H) 139 (H)   Diabetes history: DM2 Outpatient Diabetes medications: Semglee 0-25 QHS, Novolog 0-20 units TID (not taking), Humalog 15 units TID and Metformin 1000 mg BID (not taking) Current orders for Inpatient glycemic control:  Levemir 15 units bid Novolog 0-20 units Q4  Inpatient Diabetes Program Recommendations:    -   Add Novolog 4 units tid meal coverage if eating >50% of meals to prevent post prandial spikes.  Thanks,  Tama Headings RN, MSN, BC-ADM Inpatient Diabetes Coordinator Team Pager [phone] (8a-5p)

## 2022-03-18 NOTE — Progress Notes (Signed)
A-line removed as ordered by Dr. Dillard Cannon, patient tolerated well, pressure applied after removal, and pressure dressing placed.  All parts intact.

## 2022-03-18 NOTE — Progress Notes (Signed)
Report given to [facility], RN who will be the receiving nurse for this patient on 2A.  Awaiting for room to be cleaned so patient can transfer over.

## 2022-03-18 NOTE — Progress Notes (Signed)
Subjective: [name] continues to improve with medical therapy for his sepsis with associated right perinephric hematoma with possible emphysematous pyelonephritis.  He reports no pain.  ROS:  Review of Systems  Constitutional:  Negative for chills and fever.  Respiratory:  Negative for sputum production.   Cardiovascular:  Negative for chest pain.    Anti-infectives: Anti-infectives (From admission, onward)    Start     Dose/Rate Route Frequency Ordered Stop   03/18/22 1400  ceFAZolin (ANCEF) IVPB 1 g/50 mL premix  Status:  Discontinued        1 g 100 mL/hr over 30 Minutes Intravenous Every 8 hours 03/18/22 0739 03/18/22 0739   03/18/22 1400  ceFAZolin (ANCEF) IVPB 2g/100 mL premix        2 g 200 mL/hr over 30 Minutes Intravenous Every 8 hours 03/18/22 0739     03/16/22 1500  levofloxacin (LEVAQUIN) IVPB 750 mg  Status:  Discontinued        750 mg 100 mL/hr over 90 Minutes Intravenous Every 48 hours 03/16/22 1408 03/16/22 1415   03/16/22 1200  cefTRIAXone (ROCEPHIN) 2 g in sodium chloride 0.9 % 100 mL IVPB  Status:  Discontinued        2 g 200 mL/hr over 30 Minutes Intravenous Every 24 hours 03/16/22 0507 03/18/22 0739   03/16/22 0300  ceFEPIme (MAXIPIME) 2 g in sodium chloride 0.9 % 100 mL IVPB  Status:  Discontinued        2 g 200 mL/hr over 30 Minutes Intravenous Every 12 hours 03/15/22 2030 03/16/22 0505   03/15/22 2200  ceFEPIme (MAXIPIME) 1 g in sodium chloride 0.9 % 100 mL IVPB  Status:  Discontinued        1 g 200 mL/hr over 30 Minutes Intravenous Every 8 hours 03/15/22 2011 03/15/22 2029   03/15/22 2200  vancomycin variable dose per unstable renal function (pharmacist dosing)  Status:  Discontinued         Does not apply See admin instructions 03/15/22 2032 03/17/22 1154   03/15/22 1630  vancomycin (VANCOREADY) IVPB 1500 mg/300 mL       See Hyperspace for full Linked Orders Report.   1,500 mg 150 mL/hr over 120 Minutes Intravenous  Once 03/15/22 1526 03/15/22 2104    03/15/22 1530  vancomycin (VANCOCIN) IVPB 1000 mg/200 mL premix       See Hyperspace for full Linked Orders Report.   1,000 mg 200 mL/hr over 60 Minutes Intravenous  Once 03/15/22 1526 03/15/22 1736   03/15/22 1515  ceFEPIme (MAXIPIME) 2 g in sodium chloride 0.9 % 100 mL IVPB        2 g 200 mL/hr over 30 Minutes Intravenous  Once 03/15/22 1512 03/15/22 1614   03/15/22 1515  vancomycin (VANCOCIN) IVPB 1000 mg/200 mL premix  Status:  Discontinued        1,000 mg 200 mL/hr over 60 Minutes Intravenous  Once 03/15/22 1512 03/15/22 1524       Current Facility-Administered Medications  Medication Dose Route Frequency Provider Last Rate Last Admin   0.9 %  sodium chloride infusion  250 mL Intravenous Continuous Rust-Chester, Huel Cote, NP   Held at 03/16/22 0018   0.9 %  sodium chloride infusion   Intravenous Continuous Darel Hong D, NP 50 mL/hr at 03/18/22 1100 Infusion Verify at 03/18/22 1100   amiodarone (PACERONE) tablet 200 mg  200 mg Oral Daily Hugelmeyer, Alexis, DO   200 mg at 03/18/22 0955   ascorbic acid (VITAMIN C)  tablet 500 mg  500 mg Oral Daily Hugelmeyer, Alexis, DO   500 mg at 03/18/22 0956   budesonide (PULMICORT) nebulizer solution 0.5 mg  0.5 mg Nebulization BID Hugelmeyer, Alexis, DO   0.5 mg at 03/17/22 2104   ceFAZolin (ANCEF) IVPB 2g/100 mL premix  2 g Intravenous Q8H Benita Gutter, RPH       Chlorhexidine Gluconate Cloth 2 % PADS 6 each  6 each Topical Daily Hugelmeyer, Alexis, DO   6 each at 03/17/22 2136   cholecalciferol (VITAMIN D3) 25 MCG (1000 UNIT) tablet 1,000 Units  1,000 Units Oral Daily Hugelmeyer, Alexis, DO   1,000 Units at 03/18/22 0956   cyanocobalamin (VITAMIN B12) tablet 1,000 mcg  1,000 mcg Oral Daily Hugelmeyer, Alexis, DO   1,000 mcg at 03/18/22 0955   dextrose 50 % solution 0-50 mL  0-50 mL Intravenous PRN Hugelmeyer, Alexis, DO       ezetimibe (ZETIA) tablet 10 mg  10 mg Oral Daily Hugelmeyer, Alexis, DO   10 mg at 03/18/22 1003   ferrous  sulfate tablet 325 mg  325 mg Oral Q breakfast Hugelmeyer, Alexis, DO   325 mg at 03/18/22 0956   finasteride (PROSCAR) tablet 5 mg  5 mg Oral Daily Hugelmeyer, Alexis, DO   5 mg at 03/18/22 1000   insulin aspart (novoLOG) injection 0-20 Units  0-20 Units Subcutaneous Q4H Rust-Chester, Britton L, NP   4 Units at 03/18/22 1001   insulin detemir (LEVEMIR) injection 15 Units  15 Units Subcutaneous BID Rust-Chester, Huel Cote, NP   15 Units at 03/18/22 1000   levothyroxine (SYNTHROID) tablet 25 mcg  25 mcg Oral Q0600 Hugelmeyer, Alexis, DO   25 mcg at 03/18/22 0542   multivitamin with minerals tablet 1 tablet  1 tablet Oral Daily Hugelmeyer, Alexis, DO   1 tablet at 03/18/22 0955   mupirocin ointment (BACTROBAN) 2 % 1 Application  1 Application Nasal BID Rust-Chester, Huel Cote, NP   1 Application at 00/86/76 0957   pantoprazole (PROTONIX) EC tablet 40 mg  40 mg Oral Daily Benita Gutter, RPH   40 mg at 03/18/22 0955   primidone (MYSOLINE) tablet 250 mg  250 mg Oral BID Hugelmeyer, Alexis, DO   250 mg at 03/18/22 0955   tamsulosin (FLOMAX) capsule 0.4 mg  0.4 mg Oral Daily Hugelmeyer, Alexis, DO   0.4 mg at 03/18/22 0955   traMADol (ULTRAM) tablet 50 mg  50 mg Oral Daily PRN Hugelmeyer, Alexis, DO   50 mg at 03/18/22 0956     Objective: Vital signs in last 24 hours: Temp:  [98.8 F (37.1 C)-99.2 F (37.3 C)] 99.1 F (37.3 C) (11/19 0800) Pulse Rate:  [48-111] 111 (11/19 1100) Resp:  [17-25] 17 (11/19 1100) BP: (89-136)/(45-87) 117/53 (11/19 1100) SpO2:  [90 %-99 %] 96 % (11/19 1100) Arterial Line BP: (106-179)/(29-55) 150/48 (11/19 1100)  Intake/Output from previous day: 11/18 0701 - 11/19 0700 In: 1537.4 [I.V.:1437.4; IV Piggyback:100] Out: 1300 [Urine:1300] Intake/Output this shift: Total I/O In: 199.9 [I.V.:199.9] Out: 900 [Urine:900]   Physical Exam Vitals reviewed.  Constitutional:      Appearance: Normal appearance. He is obese.  Cardiovascular:     Rate and Rhythm:  Normal rate.  Pulmonary:     Effort: Pulmonary effort is normal. No respiratory distress.  Abdominal:     Palpations: Abdomen is soft.     Tenderness: There is no abdominal tenderness. There is no right CVA tenderness.  Neurological:  General: No focal deficit present.     Mental Status: He is alert and oriented to person, place, and time.     Lab Results:  Recent Labs    03/17/22 0318 03/18/22 0422  WBC 7.3 6.8  HGB 8.1* 7.9*  HCT 24.3* 23.4*  PLT 104* 122*    BMET Recent Labs    03/17/22 0318 03/18/22 0422  NA 133* 133*  K 3.8 3.7  CL 100 102  CO2 24 24  GLUCOSE 226* 199*  BUN 53* 42*  CREATININE 2.06* 1.45*  CALCIUM 8.0* 8.2*    PT/INR Recent Labs    03/15/22 1519 03/16/22 0703  LABPROT 18.5* 16.6*  INR 1.6* 1.4*    ABG Recent Labs    03/15/22 1512 03/15/22 2359  PHART  --  7.4  HCO3 25.4 26.0     Studies/Results: No results found.   Assessment and Plan: GNR sepsis with right perinephric hematoma with possible empysematous pyelonephritis.   He is improving clinically on antibiotic therapy.  He should have a repeat CT tomorrow to assess the need for further intervention.       LOS: 3 days    Brealynn Contino 11/19/2023Patient ID: Alec Less., male   DOB: 1956-01-31, 66 y.o.   MRN: [id number] Patient ID: Alec Less., male   DOB: May 24, 1955, 66 y.o.   MRN: [id number]

## 2022-03-19 ENCOUNTER — Inpatient Hospital Stay: Payer: Medicare Other

## 2022-03-19 ENCOUNTER — Encounter: Payer: Self-pay | Admitting: Family Medicine

## 2022-03-19 DIAGNOSIS — B961 Klebsiella pneumoniae [K. pneumoniae] as the cause of diseases classified elsewhere: Secondary | ICD-10-CM | POA: Insufficient documentation

## 2022-03-19 DIAGNOSIS — R739 Hyperglycemia, unspecified: Secondary | ICD-10-CM | POA: Diagnosis not present

## 2022-03-19 DIAGNOSIS — R6521 Severe sepsis with septic shock: Secondary | ICD-10-CM

## 2022-03-19 DIAGNOSIS — A419 Sepsis, unspecified organism: Secondary | ICD-10-CM | POA: Diagnosis not present

## 2022-03-19 DIAGNOSIS — N390 Urinary tract infection, site not specified: Secondary | ICD-10-CM | POA: Diagnosis not present

## 2022-03-19 LAB — GLUCOSE, CAPILLARY
Glucose-Capillary: 140 mg/dL — ABNORMAL HIGH (ref 70–99)
Glucose-Capillary: 165 mg/dL — ABNORMAL HIGH (ref 70–99)
Glucose-Capillary: 166 mg/dL — ABNORMAL HIGH (ref 70–99)
Glucose-Capillary: 180 mg/dL — ABNORMAL HIGH (ref 70–99)
Glucose-Capillary: 183 mg/dL — ABNORMAL HIGH (ref 70–99)
Glucose-Capillary: 219 mg/dL — ABNORMAL HIGH (ref 70–99)
Glucose-Capillary: 255 mg/dL — ABNORMAL HIGH (ref 70–99)

## 2022-03-19 LAB — BASIC METABOLIC PANEL
Anion gap: 4 — ABNORMAL LOW (ref 5–15)
BUN: 32 mg/dL — ABNORMAL HIGH (ref 8–23)
CO2: 26 mmol/L (ref 22–32)
Calcium: 8.2 mg/dL — ABNORMAL LOW (ref 8.9–10.3)
Chloride: 109 mmol/L (ref 98–111)
Creatinine, Ser: 1.43 mg/dL — ABNORMAL HIGH (ref 0.61–1.24)
GFR, Estimated: 54 mL/min — ABNORMAL LOW (ref >60)
Glucose, Bld: 156 mg/dL — ABNORMAL HIGH (ref 70–99)
Potassium: 3.5 mmol/L (ref 3.5–5.1)
Sodium: 139 mmol/L (ref 135–145)

## 2022-03-19 LAB — PHOSPHORUS: Phosphorus: 2.6 mg/dL (ref 2.5–4.6)

## 2022-03-19 LAB — MAGNESIUM: Magnesium: 1.9 mg/dL (ref 1.7–2.4)

## 2022-03-19 NOTE — Progress Notes (Addendum)
Progress Note    [name].  OVZ:[id number] DOB: 1955/12/03  DOA: 03/15/2022 PCP: [name], MD      Brief Narrative:    Medical records reviewed and are as summarized below:  [name]. is a 66 y.o. male with medical history significant for asthma, COPD, CHF, diabetes, history of left BKA, BPH, hypertension, dyslipidemia, hypothyroidism, recent hospitalization for fall and atrial fibrillation with RVR in October 2023, s/p cardioversion on 02/13/2022.  He presented to the hospital because of confusion and hyperglycemia.  His wife said he had tried to use the lift to get patient into the bed the night prior to admission but was unsuccessful and patient ended up on the floor.  His fingerstick blood sugar was 579 on admission and 604 on BMP.  He was hypotensive with BP of 73/43, tachycardic with pulse of 108, afebrile, lactic acid 2.2, sodium 125, bicarb 21, creatinine 2.3, WBC 11.4, hemoglobin 8.8 and platelet count 124.  CT abdomen and pelvis showed large subcapsular hematoma of the right kidney and large amount of gas concerning for emphysematous pyelonephritis.  He was admitted to the hospital for septic shock secondary to acute UTI.  He was treated with IV fluids and empiric IV antibiotics.  He also required Levophed for septic shock.  He was also treated with IV insulin drip for hyperosmolar hyperglycemic state.  He had AKI and hyponatremia that improved with IV fluids.        Assessment/Plan:   Principal Problem:   Septic shock (HCC) Active Problems:   Sepsis secondary to UTI ([place])   Hyperglycemia   Bacteremia due to Klebsiella pneumoniae    Body mass index is 37.37 kg/m.  (Obesity)    Severe sepsis with septic shock secondary to acute UTI/emphysematous pyelonephritis, Klebsiella pneumoniae bacteremia: Continue IV cefazolin.  No growth on repeat blood cultures.  Right kidney subcapsular hematoma: Repeat CT abdomen pelvis showed right  kidney subcapsular hematoma.  Follow-up with urologist for further recommendations.   Moderate right pleural effusion, very small left pleural effusion: Suspected to be reactive.  No respiratory distress.  Plan for thoracentesis tomorrow   Generalized weakness: Consult PT and OT   Acute hypoxic respiratory failure: Taper off oxygen as able   Type II DM with hyperglycemia: Continue Levemir and NovoLog   Paroxysmal atrial fibrillation and atrial flutter s/p cardioversion on 02/13/2022: Continue amiodarone.  Eliquis on hold in case he requires surgical intervention   Acute metabolic encephalopathy: Improved   AKI: Improving.  Baseline creatinine is around 1.   Diastolic CHF and COPD: Compensated.  2D echo in June 2023 showed EF estimated at 55 to 60%, no normal LV diastolic parameters   Other comorbidities include hypothyroidism, hepatic steatosis    Diet Order             Diet Carb Modified Fluid consistency: Thin; Room service appropriate? Yes  Diet effective now                            Consultants: Intensivist Urologist  Procedures: None    Medications:    amiodarone  200 mg Oral Daily   ascorbic acid  500 mg Oral Daily   budesonide  0.5 mg Nebulization BID   Chlorhexidine Gluconate Cloth  6 each Topical Daily   cholecalciferol  1,000 Units Oral Daily   cyanocobalamin  1,000 mcg Oral Daily   ezetimibe  10 mg Oral  Daily   ferrous sulfate  325 mg Oral Q breakfast   finasteride  5 mg Oral Daily   insulin aspart  0-20 Units Subcutaneous Q4H   insulin detemir  15 Units Subcutaneous BID   levothyroxine  25 mcg Oral Q0600   multivitamin with minerals  1 tablet Oral Daily   mupirocin ointment  1 Application Nasal BID   pantoprazole  40 mg Oral Daily   primidone  250 mg Oral BID   tamsulosin  0.4 mg Oral Daily   Continuous Infusions:  sodium chloride Stopped (03/16/22 0018)   sodium chloride Stopped (03/18/22 1729)    ceFAZolin (ANCEF) IV  2 g (03/19/22 1621)     Anti-infectives (From admission, onward)    Start     Dose/Rate Route Frequency Ordered Stop   03/18/22 1400  ceFAZolin (ANCEF) IVPB 1 g/50 mL premix  Status:  Discontinued        1 g 100 mL/hr over 30 Minutes Intravenous Every 8 hours 03/18/22 0739 03/18/22 0739   03/18/22 1400  ceFAZolin (ANCEF) IVPB 2g/100 mL premix        2 g 200 mL/hr over 30 Minutes Intravenous Every 8 hours 03/18/22 0739     03/16/22 1500  levofloxacin (LEVAQUIN) IVPB 750 mg  Status:  Discontinued        750 mg 100 mL/hr over 90 Minutes Intravenous Every 48 hours 03/16/22 1408 03/16/22 1415   03/16/22 1200  cefTRIAXone (ROCEPHIN) 2 g in sodium chloride 0.9 % 100 mL IVPB  Status:  Discontinued        2 g 200 mL/hr over 30 Minutes Intravenous Every 24 hours 03/16/22 0507 03/18/22 0739   03/16/22 0300  ceFEPIme (MAXIPIME) 2 g in sodium chloride 0.9 % 100 mL IVPB  Status:  Discontinued        2 g 200 mL/hr over 30 Minutes Intravenous Every 12 hours 03/15/22 2030 03/16/22 0505   03/15/22 2200  ceFEPIme (MAXIPIME) 1 g in sodium chloride 0.9 % 100 mL IVPB  Status:  Discontinued        1 g 200 mL/hr over 30 Minutes Intravenous Every 8 hours 03/15/22 2011 03/15/22 2029   03/15/22 2200  vancomycin variable dose per unstable renal function (pharmacist dosing)  Status:  Discontinued         Does not apply See admin instructions 03/15/22 2032 03/17/22 1154   03/15/22 1630  vancomycin (VANCOREADY) IVPB 1500 mg/300 mL       See Hyperspace for full Linked Orders Report.   1,500 mg 150 mL/hr over 120 Minutes Intravenous  Once 03/15/22 1526 03/15/22 2104   03/15/22 1530  vancomycin (VANCOCIN) IVPB 1000 mg/200 mL premix       See Hyperspace for full Linked Orders Report.   1,000 mg 200 mL/hr over 60 Minutes Intravenous  Once 03/15/22 1526 03/15/22 1736   03/15/22 1515  ceFEPIme (MAXIPIME) 2 g in sodium chloride 0.9 % 100 mL IVPB        2 g 200 mL/hr over 30 Minutes Intravenous  Once 03/15/22 1512  03/15/22 1614   03/15/22 1515  vancomycin (VANCOCIN) IVPB 1000 mg/200 mL premix  Status:  Discontinued        1,000 mg 200 mL/hr over 60 Minutes Intravenous  Once 03/15/22 1512 03/15/22 1524              Family Communication/Anticipated D/C date and plan/Code Status   DVT prophylaxis: Place and maintain sequential compression device Start: 03/16/22 0633 SCDs  Start: 03/15/22 2012     Code Status: Full Code  Family Communication: None Disposition Plan: Plan to discharge home in 2 to 3 days   Status is: Inpatient Remains inpatient appropriate because: IV antibiotics       Subjective:   Interval events noted.  No abdominal pain, cough, shortness of breath or chest pain  Objective:    Vitals:   03/19/22 0733 03/19/22 0803 03/19/22 1130 03/19/22 1616  BP: (!) 114/58  118/63 119/60  Pulse: 96  (!) 54 (!) 59  Resp: '19  18 18  '$ Temp: 98.3 F (36.8 C)  98.3 F (36.8 C) 98 F (36.7 C)  TempSrc: Oral  Oral Oral  SpO2: 99% 97% 97% 97%  Weight:      Height:       No data found.   Intake/Output Summary (Last 24 hours) at 03/19/2022 1712 Last data filed at 03/19/2022 1619 Gross per 24 hour  Intake 1308.3 ml  Output 3775 ml  Net -2466.7 ml   Filed Weights   03/15/22 1448 03/19/22 0346  Weight: (!) 146.9 kg (!) 139.3 kg    Exam:   GEN: NAD SKIN: Warm and dry.  Chronic brownish discoloration of right distal leg EYES: No pallor or icterus ENT: MMM CV: RRR PULM: CTA B ABD: soft, obese, NT, +BS CNS: AAO x 3, non focal EXT: Left BKA      Data Reviewed:   I have personally reviewed following labs and imaging studies:  Labs: Labs show the following:   Basic Metabolic Panel: Recent Labs  Lab 03/15/22 2109 03/16/22 0257 03/16/22 0703 03/16/22 1800 03/17/22 0318 03/18/22 0422 03/19/22 0439  NA 127*   < > 129* 131* 133* 133* 139  K 3.7   < > 3.9 3.9 3.8 3.7 3.5  CL 92*   < > 95* 97* 100 102 109  CO2 24   < > '24 22 24 24 26  '$ GLUCOSE 485*    < > 191* 268* 226* 199* 156*  BUN 50*   < > 55* 55* 53* 42* 32*  CREATININE 2.23*   < > 2.35* 2.24* 2.06* 1.45* 1.43*  CALCIUM 8.0*   < > 8.3* 7.9* 8.0* 8.2* 8.2*  MG 1.9  --   --   --  1.9 1.9 1.9  PHOS 3.9  --   --   --  4.0 2.4* 2.6   < > = values in this interval not displayed.   GFR Estimated Creatinine Clearance: 76.4 mL/min (A) (by C-G formula based on SCr of 1.43 mg/dL (H)). Liver Function Tests: Recent Labs  Lab 03/15/22 1519  AST 26  ALT 19  ALKPHOS 85  BILITOT 2.1*  PROT 6.7  ALBUMIN 3.2*   Recent Labs  Lab 03/16/22 0257  LIPASE 22   No results for input(s): "AMMONIA" in the last 168 hours. Coagulation profile Recent Labs  Lab 03/15/22 1519 03/16/22 0703  INR 1.6* 1.4*    CBC: Recent Labs  Lab 03/15/22 1519 03/16/22 1320 03/16/22 2007 03/17/22 0318 03/18/22 0422  WBC 11.4*  --  7.0 7.3 6.8  NEUTROABS 9.8*  --   --   --   --   HGB 8.8* 8.2* 7.6* 8.1* 7.9*  HCT 27.1* 24.7* 23.3* 24.3* 23.4*  MCV 92.2  --  90.3 90.7 90.7  PLT 124*  --  104* 104* 122*   Cardiac Enzymes: Recent Labs  Lab 03/15/22 1519  CKTOTAL 212   BNP (last 3 results) No results  for input(s): "PROBNP" in the last 8760 hours. CBG: Recent Labs  Lab 03/19/22 0122 03/19/22 0359 03/19/22 0757 03/19/22 1130 03/19/22 1617  GLUCAP 166* 165* 140* 180* 219*   D-Dimer: No results for input(s): "DDIMER" in the last 72 hours. Hgb A1c: No results for input(s): "HGBA1C" in the last 72 hours. Lipid Profile: No results for input(s): "CHOL", "HDL", "LDLCALC", "TRIG", "CHOLHDL", "LDLDIRECT" in the last 72 hours. Thyroid function studies: No results for input(s): "TSH", "T4TOTAL", "T3FREE", "THYROIDAB" in the last 72 hours.  Invalid input(s): "FREET3" Anemia work up: No results for input(s): "VITAMINB12", "FOLATE", "FERRITIN", "TIBC", "IRON", "RETICCTPCT" in the last 72 hours. Sepsis Labs: Recent Labs  Lab 03/15/22 1519 03/15/22 1720 03/15/22 2109 03/15/22 2241 03/16/22 0045  03/16/22 2007 03/17/22 0318 03/18/22 0422  PROCALCITON  --   --   --   --  14.20  --  7.33 3.48  WBC 11.4*  --   --   --   --  7.0 7.3 6.8  LATICACIDVEN 2.2* 1.9 2.1* 1.7  --   --   --   --     Microbiology Recent Results (from the past 240 hour(s))  Resp Panel by RT-PCR (Flu A&B, Covid) Anterior Nasal Swab     Status: None   Collection Time: 03/15/22  3:19 PM   Specimen: Anterior Nasal Swab  Result Value Ref Range Status   SARS Coronavirus 2 by RT PCR NEGATIVE NEGATIVE Final    Comment: (NOTE) SARS-CoV-2 target nucleic acids are NOT DETECTED.  The SARS-CoV-2 RNA is generally detectable in upper respiratory specimens during the acute phase of infection. The lowest concentration of SARS-CoV-2 viral copies this assay can detect is 138 copies/mL. A negative result does not preclude SARS-Cov-2 infection and should not be used as the sole basis for treatment or other patient management decisions. A negative result may occur with  improper specimen collection/handling, submission of specimen other than nasopharyngeal swab, presence of viral mutation(s) within the areas targeted by this assay, and inadequate number of viral copies(<138 copies/mL). A negative result must be combined with clinical observations, patient history, and epidemiological information. The expected result is Negative.  Fact Sheet for Patients:  EntrepreneurPulse.com.au  Fact Sheet for Healthcare Providers:  IncredibleEmployment.be  This test is no t yet approved or cleared by the [place] FDA and  has been authorized for detection and/or diagnosis of SARS-CoV-2 by FDA under an Emergency Use Authorization (EUA). This EUA will remain  in effect (meaning this test can be used) for the duration of the COVID-19 declaration under Section 564(b)(1) of the Act, 21 U.S.C.section 360bbb-3(b)(1), unless the authorization is terminated  or revoked sooner.       Influenza A by  PCR NEGATIVE NEGATIVE Final   Influenza B by PCR NEGATIVE NEGATIVE Final    Comment: (NOTE) The Xpert Xpress SARS-CoV-2/FLU/RSV plus assay is intended as an aid in the diagnosis of influenza from Nasopharyngeal swab specimens and should not be used as a sole basis for treatment. Nasal washings and aspirates are unacceptable for Xpert Xpress SARS-CoV-2/FLU/RSV testing.  Fact Sheet for Patients: EntrepreneurPulse.com.au  Fact Sheet for Healthcare Providers: IncredibleEmployment.be  This test is not yet approved or cleared by the [place] FDA and has been authorized for detection and/or diagnosis of SARS-CoV-2 by FDA under an Emergency Use Authorization (EUA). This EUA will remain in effect (meaning this test can be used) for the duration of the COVID-19 declaration under Section 564(b)(1) of the Act, 21 U.S.C. section 360bbb-3(b)(1),  unless the authorization is terminated or revoked.  Performed at [facility], [address] [place], [place] [address]   Blood Culture (routine x 2)     Status: Abnormal   Collection Time: 03/15/22  3:19 PM   Specimen: BLOOD LEFT HAND  Result Value Ref Range Status   Specimen Description   Final    BLOOD LEFT HAND Performed at [facility], [address] [place], [place] [address]    Special Requests   Final    BOTTLES DRAWN AEROBIC AND ANAEROBIC Blood Culture adequate volume Performed at [facility], [address] [place], [place] [address]    Culture  Setup Time   Final    GRAM NEGATIVE RODS IN BOTH AEROBIC AND ANAEROBIC BOTTLES CRITICAL RESULT CALLED TO, READ BACK BY AND VERIFIED WITH: NATHAN BELUE 03/16/2022 AT 0408 SRR Performed at [facility], [place] [address] [place], [place] [address]    Culture KLEBSIELLA PNEUMONIAE (A)  Final   Report Status 03/18/2022 FINAL  Final   Organism ID, Bacteria KLEBSIELLA PNEUMONIAE  Final      Susceptibility    Klebsiella pneumoniae - MIC*    AMPICILLIN >=32 RESISTANT Resistant     CEFAZOLIN <=4 SENSITIVE Sensitive     CEFEPIME <=0.12 SENSITIVE Sensitive     CEFTAZIDIME <=1 SENSITIVE Sensitive     CEFTRIAXONE <=0.25 SENSITIVE Sensitive     CIPROFLOXACIN <=0.25 SENSITIVE Sensitive     GENTAMICIN <=1 SENSITIVE Sensitive     IMIPENEM <=0.25 SENSITIVE Sensitive     TRIMETH/SULFA <=20 SENSITIVE Sensitive     AMPICILLIN/SULBACTAM 4 SENSITIVE Sensitive     PIP/TAZO <=4 SENSITIVE Sensitive     * KLEBSIELLA PNEUMONIAE  Urine Culture     Status: Abnormal   Collection Time: 03/15/22  3:19 PM   Specimen: In/Out Cath Urine  Result Value Ref Range Status   Specimen Description   Final    IN/OUT CATH URINE Performed at [facility], [address] [place], [place] [address]    Special Requests   Final    NONE Performed at [facility], [address], [place], [place] [address]    Culture >=100,000 COLONIES/mL KLEBSIELLA PNEUMONIAE (A)  Final   Report Status 03/17/2022 FINAL  Final   Organism ID, Bacteria KLEBSIELLA PNEUMONIAE (A)  Final      Susceptibility   Klebsiella pneumoniae - MIC*    AMPICILLIN >=32 RESISTANT Resistant     CEFAZOLIN <=4 SENSITIVE Sensitive     CEFEPIME <=0.12 SENSITIVE Sensitive     CEFTRIAXONE <=0.25 SENSITIVE Sensitive     CIPROFLOXACIN <=0.25 SENSITIVE Sensitive     GENTAMICIN <=1 SENSITIVE Sensitive     IMIPENEM <=0.25 SENSITIVE Sensitive     NITROFURANTOIN 64 INTERMEDIATE Intermediate     TRIMETH/SULFA <=20 SENSITIVE Sensitive     AMPICILLIN/SULBACTAM 4 SENSITIVE Sensitive     PIP/TAZO <=4 SENSITIVE Sensitive     * >=100,000 COLONIES/mL KLEBSIELLA PNEUMONIAE  Blood Culture ID Panel (Reflexed)     Status: Abnormal   Collection Time: 03/15/22  3:19 PM  Result Value Ref Range Status   Enterococcus faecalis NOT DETECTED NOT DETECTED Final   Enterococcus Faecium NOT DETECTED NOT DETECTED Final   Listeria monocytogenes NOT DETECTED NOT DETECTED  Final   Staphylococcus species NOT DETECTED NOT DETECTED Final   Staphylococcus aureus (BCID) NOT DETECTED NOT DETECTED Final   Staphylococcus epidermidis NOT DETECTED NOT DETECTED Final   Staphylococcus lugdunensis NOT DETECTED NOT DETECTED Final  Streptococcus species NOT DETECTED NOT DETECTED Final   Streptococcus agalactiae NOT DETECTED NOT DETECTED Final   Streptococcus pneumoniae NOT DETECTED NOT DETECTED Final   Streptococcus pyogenes NOT DETECTED NOT DETECTED Final   A.calcoaceticus-baumannii NOT DETECTED NOT DETECTED Final   Bacteroides fragilis NOT DETECTED NOT DETECTED Final   Enterobacterales DETECTED (A) NOT DETECTED Final    Comment: Enterobacterales represent a large order of gram negative bacteria, not a single organism. CRITICAL RESULT CALLED TO, READ BACK BY AND VERIFIED WITH: NATHAN BELUE 03/16/2022 AT 0408 SRR    Enterobacter cloacae complex NOT DETECTED NOT DETECTED Final   Escherichia coli NOT DETECTED NOT DETECTED Final   Klebsiella aerogenes NOT DETECTED NOT DETECTED Final   Klebsiella oxytoca NOT DETECTED NOT DETECTED Final   Klebsiella pneumoniae DETECTED (A) NOT DETECTED Final    Comment: CRITICAL RESULT CALLED TO, READ BACK BY AND VERIFIED WITH: NATHAN BELUE 03/16/2022 AT 0408 SRR    Proteus species NOT DETECTED NOT DETECTED Final   Salmonella species NOT DETECTED NOT DETECTED Final   Serratia marcescens NOT DETECTED NOT DETECTED Final   Haemophilus influenzae NOT DETECTED NOT DETECTED Final   Neisseria meningitidis NOT DETECTED NOT DETECTED Final   Pseudomonas aeruginosa NOT DETECTED NOT DETECTED Final   Stenotrophomonas maltophilia NOT DETECTED NOT DETECTED Final   Candida albicans NOT DETECTED NOT DETECTED Final   Candida auris NOT DETECTED NOT DETECTED Final   Candida glabrata NOT DETECTED NOT DETECTED Final   Candida krusei NOT DETECTED NOT DETECTED Final   Candida parapsilosis NOT DETECTED NOT DETECTED Final   Candida tropicalis NOT DETECTED NOT  DETECTED Final   Cryptococcus neoformans/gattii NOT DETECTED NOT DETECTED Final   CTX-M ESBL NOT DETECTED NOT DETECTED Final   Carbapenem resistance IMP NOT DETECTED NOT DETECTED Final   Carbapenem resistance KPC NOT DETECTED NOT DETECTED Final   Carbapenem resistance NDM NOT DETECTED NOT DETECTED Final   Carbapenem resist OXA 48 LIKE NOT DETECTED NOT DETECTED Final   Carbapenem resistance VIM NOT DETECTED NOT DETECTED Final    Comment: Performed at [facility], [address] [place], [place] [address]  Blood Culture (routine x 2)     Status: None (Preliminary result)   Collection Time: 03/15/22  9:09 PM   Specimen: BLOOD  Result Value Ref Range Status   Specimen Description BLOOD RIGHT HAND  Final   Special Requests   Final    BOTTLES DRAWN AEROBIC AND ANAEROBIC Blood Culture results may not be optimal due to an inadequate volume of blood received in culture bottles   Culture   Final    NO GROWTH 4 DAYS Performed at [facility], [address] [place], [place] [address]    Report Status PENDING  Incomplete  MRSA Next Gen by PCR, Nasal     Status: Abnormal   Collection Time: 03/15/22 10:02 PM   Specimen: Nasal Mucosa; Nasal Swab  Result Value Ref Range Status   MRSA by PCR Next Gen DETECTED (A) NOT DETECTED Final    Comment: RESULT CALLED TO, READ BACK BY AND VERIFIED WITH: Burnetta Sabin 03/15/2022 AT 2329 SRR (NOTE) The GeneXpert MRSA Assay (FDA approved for NASAL specimens only), is one component of a comprehensive MRSA colonization surveillance program. It is not intended to diagnose MRSA infection nor to guide or monitor treatment for MRSA infections. Test performance is not FDA approved in patients Wood than 67 years old. Performed at [facility], [address], [place], [place] [address]  Procedures and diagnostic studies:  CT ABDOMEN PELVIS WO CONTRAST  Result Date: 03/19/2022 CLINICAL DATA:  History of pyelonephritis by  report also found to have perinephric hematoma EXAM: CT ABDOMEN AND PELVIS WITHOUT CONTRAST TECHNIQUE: Multidetector CT imaging of the abdomen and pelvis was performed following the standard protocol without IV contrast. RADIATION DOSE REDUCTION: This exam was performed according to the departmental dose-optimization program which includes automated exposure control, adjustment of the mA and/or kV according to patient size and/or use of iterative reconstruction technique. COMPARISON:  March 16, 2022 FINDINGS: Lower chest: Moderate RIGHT effusion. Basilar airspace disease is slightly increased in the RIGHT chest as is the pleural fluid. Very small LEFT pleural effusion slightly increased as well. Aortic valvular calcifications, mitral annular calcifications and coronary artery disease similar to previous imaging. Low attenuation in the cardiac blood pool. Hepatobiliary: Smooth hepatic contours. No pericholecystic stranding. No gross biliary duct distension. No visible lesion on noncontrast imaging. Pancreas: Pancreas with normal contours, no signs of inflammation or peripancreatic fluid. Spleen: Normal. Adrenals/Urinary Tract: Adrenal glands are normal. RIGHT kidney: Large perinephric and subcapsular hematoma with compression of renal parenchyma. At the level of the interpolar RIGHT kidney this measures approximately 8 x 4.2 cm previously 8.3 x 4.3 cm. Hematoma extends into the perinephric space. Density of this hematoma is similar to previous imaging. The amount of hematoma in the inferior perinephric and posterior pararenal space is perhaps very slightly diminished. No nephrolithiasis. No ureteral calculi or obstruction on the RIGHT. Gas along the RIGHT renal parenchyma and or collecting system elements of the lower and interpolar RIGHT kidney have diminished in terms of components extending into are involving potentially the renal parenchyma. Gas tracks inferiorly into the hematoma immediately subjacent to the  RIGHT kidney. In the coronal plane gas measures 5.7 x 2.4 cm along the lower margin of the subcapsular portion of the hematoma previously 5.5 x 3.0 cm. Smaller amounts of gas are seen in the perinephric aspect of the hematoma that were not present previously,, for instance on image 61/5 where there is a 1.6 x 2 cm area of gas within this inferior perinephric hematoma and small amounts of gas tracking from the subcapsular hematoma into the perinephric hematoma. Perinephric hematoma as measured in the coronal plane (image 61/5 6.1 x 2.8 cm as compared to 7.5 x 3.1 cm. LEFT kidney: No signs of hematoma, nephrolithiasis or hydronephrosis on the LEFT. Renal cortical scarring on the LEFT. Perinephric stranding on the LEFT. Stomach/Bowel: No acute gastrointestinal findings. Vascular/Lymphatic: Aortic atherosclerosis. No sign of aneurysm. Smooth contour of the IVC. There is no gastrohepatic or hepatoduodenal ligament lymphadenopathy. No retroperitoneal or mesenteric lymphadenopathy. No pelvic sidewall lymphadenopathy. Atherosclerotic changes are moderate and calcified. Reproductive: Unremarkable by CT. Other: Body wall edema. Stranding throughout the RIGHT retroperitoneum. Musculoskeletal: Post sternotomy. Spinal degenerative changes. No acute or destructive bone process. IMPRESSION: 1. Persistent large subcapsular hematoma with compression of renal parenchyma not substantially changed in size though potentially with slight decreased size of hematoma in the perinephric space and pararenal space below the RIGHT kidney. 2. Diminished gas within RIGHT renal parenchyma and or collecting system elements of the RIGHT kidney but with propagation of gas into the perinephric/inferior pararenal extension of the hematoma. Findings may reflect partial decompression of gas collection in the subcapsular space on the previous study though may show very slight increase overall since previous imaging. While the amount of parenchymal or  collecting system involvement currently appears [facility], findings remain consistent with gas-forming infection and could  be a result of emphysematous pyelonephritis. Development of gas outside of the renal capsule may herald early retroperitoneal extension of this process. 3. Moderate RIGHT effusion slightly increased in the RIGHT chest as is the pleural fluid. Very small LEFT pleural effusion slightly increased as well. Findings could be the result of reactive effusion in the RIGHT chest. Correlate with any signs of respiratory. Infection 4. Low attenuation in the cardiac blood pool. This can be seen in the setting of anemia. 5. Aortic atherosclerosis and coronary artery disease. Aortic Atherosclerosis (ICD10-I70.0). These results will be called to the ordering clinician or representative by the Radiologist Assistant, and communication documented in the PACS or [employer]. Electronically Signed   By: Zetta Bills M.D.   On: 03/19/2022 10:49               LOS: 4 days   Sonya Pucci  [facility]   Pager on [email or web address]. If 7PM-7AM, please contact night-coverage at [email or web address]     03/19/2022, 5:12 PM

## 2022-03-19 NOTE — Care Management Important Message (Signed)
Important Message  Patient Details  Name: [name]. MRN: [id number] Date of Birth: 06/07/1955   Medicare Important Message Given:  [name] - LOS <3 / Initial given by admissions     [name] 03/19/2022, 11:02 AM

## 2022-03-19 NOTE — Inpatient Diabetes Management (Signed)
Inpatient Diabetes Program Recommendations  AACE/ADA: New Consensus Statement on Inpatient Glycemic Control   Target Ranges:  Prepandial:   less than 140 mg/dL      Peak postprandial:   less than 180 mg/dL (1-2 hours)      Critically ill patients:  140 - 180 mg/dL    Latest Reference Range & Units 03/19/22 01:22 03/19/22 03:59 03/19/22 07:57  Glucose-Capillary 70 - 99 mg/dL 166 (H) 165 (H) 140 (H)    Latest Reference Range & Units 03/18/22 04:17 03/18/22 09:45 03/18/22 11:48 03/18/22 17:28 03/18/22 20:31 03/18/22 23:19  Glucose-Capillary 70 - 99 mg/dL 191 (H) 166 (H) 139 (H) 163 (H) 144 (H) 194 (H)   Review of Glycemic Control  Diabetes history: DM2 Outpatient Diabetes medications: Semglee 0-25 QHS, Novolog 0-20 units TID (not taking), Humalog 15 units TID and Metformin 1000 mg BID (not taking)  Current orders for Inpatient glycemic control: Levemir 15 units BID, Novolog 0-20 units Q4H  Inpatient Diabetes Program Recommendations:    Insulin: If patient is eating, consider changing CBGs and Novolog 0-20 units to AC&HS and ordering Novolog 3 units TID with meals for meal coverage if patient eats at least 50% of meals.  Thanks, Barnie Alderman, RN, MSN, [place] Diabetes Coordinator Inpatient Diabetes Program [phone] (Team Pager from 8am to [place])

## 2022-03-20 ENCOUNTER — Other Ambulatory Visit: Payer: Self-pay | Admitting: Physician Assistant

## 2022-03-20 ENCOUNTER — Inpatient Hospital Stay: Payer: Medicare Other

## 2022-03-20 DIAGNOSIS — B961 Klebsiella pneumoniae [K. pneumoniae] as the cause of diseases classified elsewhere: Secondary | ICD-10-CM

## 2022-03-20 DIAGNOSIS — S37019A Minor contusion of unspecified kidney, initial encounter: Secondary | ICD-10-CM | POA: Diagnosis present

## 2022-03-20 DIAGNOSIS — A419 Sepsis, unspecified organism: Secondary | ICD-10-CM | POA: Diagnosis not present

## 2022-03-20 DIAGNOSIS — R7881 Bacteremia: Secondary | ICD-10-CM

## 2022-03-20 DIAGNOSIS — N390 Urinary tract infection, site not specified: Secondary | ICD-10-CM

## 2022-03-20 DIAGNOSIS — K7689 Other specified diseases of liver: Secondary | ICD-10-CM | POA: Diagnosis not present

## 2022-03-20 DIAGNOSIS — R6521 Severe sepsis with septic shock: Secondary | ICD-10-CM | POA: Diagnosis not present

## 2022-03-20 DIAGNOSIS — Z9889 Other specified postprocedural states: Secondary | ICD-10-CM

## 2022-03-20 DIAGNOSIS — N12 Tubulo-interstitial nephritis, not specified as acute or chronic: Secondary | ICD-10-CM | POA: Diagnosis not present

## 2022-03-20 DIAGNOSIS — R739 Hyperglycemia, unspecified: Secondary | ICD-10-CM

## 2022-03-20 LAB — CULTURE, BLOOD (ROUTINE X 2): Culture: NO GROWTH

## 2022-03-20 LAB — GLUCOSE, PLEURAL OR PERITONEAL FLUID: Glucose, Fluid: 150 mg/dL

## 2022-03-20 LAB — CBC WITH DIFFERENTIAL/PLATELET
Abs Immature Granulocytes: 0.07 10*3/uL (ref 0.00–0.07)
Basophils Absolute: 0.1 10*3/uL (ref 0.0–0.1)
Basophils Relative: 1 %
Eosinophils Absolute: 0.5 10*3/uL (ref 0.0–0.5)
Eosinophils Relative: 6 %
HCT: 26.5 % — ABNORMAL LOW (ref 39.0–52.0)
Hemoglobin: 8.4 g/dL — ABNORMAL LOW (ref 13.0–17.0)
Immature Granulocytes: 1 %
Lymphocytes Relative: 15 %
Lymphs Abs: 1.1 10*3/uL (ref 0.7–4.0)
MCH: 29.6 pg (ref 26.0–34.0)
MCHC: 31.7 g/dL (ref 30.0–36.0)
MCV: 93.3 fL (ref 80.0–100.0)
Monocytes Absolute: 0.7 10*3/uL (ref 0.1–1.0)
Monocytes Relative: 9 %
Neutro Abs: 5.3 10*3/uL (ref 1.7–7.7)
Neutrophils Relative %: 68 %
Platelets: 201 10*3/uL (ref 150–400)
RBC: 2.84 MIL/uL — ABNORMAL LOW (ref 4.22–5.81)
RDW: 15.4 % (ref 11.5–15.5)
Smear Review: NORMAL
WBC: 7.7 10*3/uL (ref 4.0–10.5)
nRBC: 0 % (ref 0.0–0.2)

## 2022-03-20 LAB — BASIC METABOLIC PANEL
Anion gap: 4 — ABNORMAL LOW (ref 5–15)
BUN: 28 mg/dL — ABNORMAL HIGH (ref 8–23)
CO2: 28 mmol/L (ref 22–32)
Calcium: 8.1 mg/dL — ABNORMAL LOW (ref 8.9–10.3)
Chloride: 109 mmol/L (ref 98–111)
Creatinine, Ser: 1.31 mg/dL — ABNORMAL HIGH (ref 0.61–1.24)
GFR, Estimated: 60 mL/min — ABNORMAL LOW (ref >60)
Glucose, Bld: 110 mg/dL — ABNORMAL HIGH (ref 70–99)
Potassium: 3.7 mmol/L (ref 3.5–5.1)
Sodium: 141 mmol/L (ref 135–145)

## 2022-03-20 LAB — PROTEIN, PLEURAL OR PERITONEAL FLUID: Total protein, fluid: <3 g/dL

## 2022-03-20 LAB — BLOOD GAS, VENOUS
Acid-base deficit: 0.8 mmol/L (ref 0.0–2.0)
Bicarbonate: 25.4 mmol/L (ref 20.0–28.0)
O2 Saturation: 39.8 %
Patient temperature: 37
pCO2, Ven: 47 mmHg (ref 44–60)
pH, Ven: 7.34 (ref 7.25–7.43)
pO2, Ven: <31 mmHg — CL (ref 32–45)

## 2022-03-20 LAB — HEPATIC FUNCTION PANEL
ALT: 23 U/L (ref 0–44)
AST: 26 U/L (ref 15–41)
Albumin: 2.1 g/dL — ABNORMAL LOW (ref 3.5–5.0)
Alkaline Phosphatase: 159 U/L — ABNORMAL HIGH (ref 38–126)
Bilirubin, Direct: 0.2 mg/dL (ref 0.0–0.2)
Indirect Bilirubin: 0.3 mg/dL (ref 0.3–0.9)
Total Bilirubin: 0.5 mg/dL (ref 0.3–1.2)
Total Protein: 5.5 g/dL — ABNORMAL LOW (ref 6.5–8.1)

## 2022-03-20 LAB — LACTATE DEHYDROGENASE: LDH: 137 U/L (ref 98–192)

## 2022-03-20 LAB — BODY FLUID CELL COUNT WITH DIFFERENTIAL
Lymphs, Fluid: 41 %
Monocyte-Macrophage-Serous Fluid: 4 %
Neutrophil Count, Fluid: 55 %
Total Nucleated Cell Count, Fluid: 287 cu mm

## 2022-03-20 LAB — GLUCOSE, CAPILLARY
Glucose-Capillary: 157 mg/dL — ABNORMAL HIGH (ref 70–99)
Glucose-Capillary: 94 mg/dL (ref 70–99)
Glucose-Capillary: 93 mg/dL (ref 70–99)
Glucose-Capillary: 154 mg/dL — ABNORMAL HIGH (ref 70–99)
Glucose-Capillary: 193 mg/dL — ABNORMAL HIGH (ref 70–99)
Glucose-Capillary: 158 mg/dL — ABNORMAL HIGH (ref 70–99)

## 2022-03-20 LAB — LACTATE DEHYDROGENASE, PLEURAL OR PERITONEAL FLUID: LD, Fluid: 125 U/L — ABNORMAL HIGH (ref 3–23)

## 2022-03-20 MED ORDER — LIDOCAINE HCL (PF) 1 % IJ SOLN
20.0000 mL | Freq: Once | INTRAMUSCULAR | Status: AC
  Administered 2022-03-20: 20 mL via INTRADERMAL

## 2022-03-20 NOTE — Procedures (Signed)
PROCEDURE SUMMARY:  Successful image-guided right thoracentesis. Yielded 300cc of red fluid. Pt tolerated procedure well. No immediate complications. EBL = trace   Specimen was sent for labs. CXR ordered.  Please see imaging section of Epic for full dictation.  Lura Em PA-C 03/20/2022 3:58 PM

## 2022-03-20 NOTE — NC FL2 (Signed)
[place] LEVEL OF CARE SCREENING TOOL     IDENTIFICATION  Patient Name: [name]. Birthdate: 07/07/1955 Sex: male Admission Date (Current Location): 03/15/2022  [facility] and [place] Number:  Engineering geologist and Address:  [facility], [address], [place], [place] [address]      Provider Number: [id number]  Attending Physician Name and Address:  Jennye Boroughs, MD  Relative Name and Phone Number:  [name] [phone]    Current Level of Care: Hospital Recommended Level of Care: [place] Prior Approval Number:    Date Approved/Denied:   PASRR Number: [id number]  Discharge Plan: SNF    Current Diagnoses: Patient Active Problem List   Diagnosis Date Noted   Perinephric hematoma 03/20/2022   Bacteremia due to Klebsiella pneumoniae 03/19/2022   Hyperglycemia 03/16/2022   Septic shock ([place]) 03/16/2022   Sepsis secondary to UTI ([place]) 03/15/2022   Acute hypoxemic respiratory failure ([place]) 02/13/2022   Atrial fibrillation with rapid ventricular response ([place]) 02/11/2022   Uncontrolled type 2 diabetes mellitus with hyperglycemia, with long-term current use of insulin ([place]) 02/11/2022   Dyslipidemia 02/11/2022   Hypothyroidism 02/11/2022   Benign prostatic hyperplasia with lower urinary tract symptoms 02/11/2022   Pressure injury of skin of heel 11/04/2021   UTI (urinary tract infection) 10/22/2021   Iron deficiency anemia 10/19/2021   Hyperkalemia 10/14/2021   Acute urinary retention 10/08/2021   Sepsis due to methicillin resistant Staphylococcus aureus (MRSA) ([place]) 10/04/2021   Acute delirium 10/04/2021   Acute on chronic respiratory failure with hypoxia and hypercapnia ([place]) 10/04/2021   Acute respiratory failure with hypoxia and hypercapnia (HCC) 10/03/2021   OSA (obstructive sleep apnea) 10/03/2021   Malnutrition of moderate degree 09/29/2021   Generalized weakness 09/29/2021    Cellulitis of left foot 08/04/2021   Uncontrolled type 2 diabetes mellitus with hypoglycemia, with long-term current use of insulin ([place]) 08/04/2021   Bilateral lower extremity edema 08/04/2021   Acute osteomyelitis of lower leg, left ([place]) 08/04/2021   Swelling of limb 07/18/2021   CHF exacerbation ([place]) 05/08/2021   Diabetes mellitus without complication (HCC)    Atrial fibrillation with RVR (HCC)    Obesity, Class III, BMI 40-49.9 (morbid obesity) ([place])    Venous stasis ulcer ([place])    Sinus tachycardia    BPH (benign prostatic hyperplasia)    Chronic anticoagulation    Anasarca    Acute on chronic diastolic CHF (congestive heart failure) (HCC)    Venous stasis of both lower extremities    AF (paroxysmal atrial fibrillation) ([place])    Lower extremity cellulitis 01/03/2021   Chronic osteomyelitis of left foot ([place]) 01/03/2021   Osteomyelitis ([place]) 01/03/2021   Severe sepsis ([place]) 02/24/2020   AKI (acute kidney injury) ([place]) 02/24/2020   Bilateral cellulitis of lower leg 02/23/2020   CAD (coronary artery disease) 02/02/2020   RLS (restless legs syndrome) 05/15/2019   Statin myopathy 05/15/2019   Diabetes mellitus type 2 in obese ([place]) 02/13/2016   Essential hypertension 02/13/2016   PAD (peripheral artery disease) ([place]) 02/13/2016   Pressure injury of skin 01/25/2016   Hypoglycemia associated with diabetes ([place]) 12/27/2013   Long-term insulin use ([place]) 12/27/2013   Microalbuminuria 12/27/2013   B12 deficiency 09/29/2013   Obesity 09/29/2013   CAD (coronary artery disease), autologous vein bypass graft 07/20/2013   Hypersomnia with sleep apnea 07/20/2013   Pure hypercholesterolemia 07/20/2013   Osteomyelitis of ankle or foot 07/20/2011    Orientation RESPIRATION BLADDER Height &  Weight     Self, Time, Situation, Place  O2 (022L) External catheter Weight: (!) 140.8 kg Height:  '6\' 4"'$  (193 cm)  BEHAVIORAL SYMPTOMS/MOOD NEUROLOGICAL BOWEL NUTRITION STATUS  Other (Comment)  (n/a)  (n/a) Continent Diet (Carb Modified)  AMBULATORY STATUS COMMUNICATION OF NEEDS Skin   Limited Assist (Does not ambulate) Verbally Normal                       Personal Care Assistance Level of Assistance  Bathing, Dressing Bathing Assistance: Limited assistance   Dressing Assistance: Limited assistance     Functional Limitations Info             SPECIAL CARE FACTORS FREQUENCY  PT (By licensed PT), OT (By licensed OT)                    Contractures      Additional Factors Info  Code Status, Allergies Code Status Info: FULL Allergies Info: Penicillins, Statins, Metformin And Related           Current Medications (03/20/2022):  This is the current hospital active medication list Current Facility-Administered Medications  Medication Dose Route Frequency Provider Last Rate Last Admin   0.9 %  sodium chloride infusion  250 mL Intravenous Continuous Rust-Chester, Huel Cote, NP   Held at 03/16/22 0018   0.9 %  sodium chloride infusion   Intravenous Continuous Bradly Bienenstock, NP   Stopped at 03/18/22 1729   amiodarone (PACERONE) tablet 200 mg  200 mg Oral Daily Hugelmeyer, Alexis, DO   200 mg at 03/20/22 3976   ascorbic acid (VITAMIN C) tablet 500 mg  500 mg Oral Daily Hugelmeyer, Alexis, DO   500 mg at 03/20/22 0926   budesonide (PULMICORT) nebulizer solution 0.5 mg  0.5 mg Nebulization BID Hugelmeyer, Alexis, DO   0.5 mg at 03/20/22 0749   ceFAZolin (ANCEF) IVPB 2g/100 mL premix  2 g Intravenous Q8H Benita Gutter, RPH 200 mL/hr at 03/20/22 1318 2 g at 03/20/22 1318   Chlorhexidine Gluconate Cloth 2 % PADS 6 each  6 each Topical Daily Hugelmeyer, Alexis, DO   6 each at 03/20/22 7341   cholecalciferol (VITAMIN D3) 25 MCG (1000 UNIT) tablet 1,000 Units  1,000 Units Oral Daily Hugelmeyer, Alexis, DO   1,000 Units at 03/20/22 0926   cyanocobalamin (VITAMIN B12) tablet 1,000 mcg  1,000 mcg Oral Daily Hugelmeyer, Alexis, DO   1,000 mcg at 03/20/22 0926    dextrose 50 % solution 0-50 mL  0-50 mL Intravenous PRN Hugelmeyer, Alexis, DO       ezetimibe (ZETIA) tablet 10 mg  10 mg Oral Daily Hugelmeyer, Alexis, DO   10 mg at 03/20/22 0926   ferrous sulfate tablet 325 mg  325 mg Oral Q breakfast Hugelmeyer, Alexis, DO   325 mg at 03/20/22 0926   finasteride (PROSCAR) tablet 5 mg  5 mg Oral Daily Hugelmeyer, Alexis, DO   5 mg at 03/20/22 0926   insulin aspart (novoLOG) injection 0-20 Units  0-20 Units Subcutaneous Q4H Rust-Chester, Britton L, NP   4 Units at 03/20/22 1229   insulin detemir (LEVEMIR) injection 15 Units  15 Units Subcutaneous BID Rust-Chester, Huel Cote, NP   15 Units at 03/20/22 0925   levothyroxine (SYNTHROID) tablet 25 mcg  25 mcg Oral Q0600 Hugelmeyer, Alexis, DO   25 mcg at 03/20/22 0530   multivitamin with minerals tablet 1 tablet  1 tablet Oral Daily Hugelmeyer, Alexis, DO  1 tablet at 03/20/22 0926   pantoprazole (PROTONIX) EC tablet 40 mg  40 mg Oral Daily Benita Gutter, RPH   40 mg at 03/20/22 5521   primidone (MYSOLINE) tablet 250 mg  250 mg Oral BID Hugelmeyer, Alexis, DO   250 mg at 03/20/22 7471   tamsulosin (FLOMAX) capsule 0.4 mg  0.4 mg Oral Daily Hugelmeyer, Alexis, DO   0.4 mg at 03/20/22 5953   traMADol (ULTRAM) tablet 50 mg  50 mg Oral Daily PRN Hugelmeyer, Alexis, DO   50 mg at 03/20/22 9672     Discharge Medications: Please see discharge summary for a list of discharge medications.  Relevant Imaging Results:  Relevant Lab Results:   Additional Information SS# [id number]  Laurena Slimmer, RN

## 2022-03-20 NOTE — Evaluation (Signed)
Physical Therapy Evaluation Patient Details Name: Saylor Sheckler. MRN: [id number] DOB: Jul 30, 1955 Today's Date: 03/20/2022  History of Present Illness  Jorrell Kuster is a 66 y.o. male with a known history of atrial fibrillation on Eliquis, CHF, COPD, CAD, diabetes, hypertension, hyperlipidemia presents to the emergency department for evaluation of confusion and poor p.o. intake.  Patient was in a usual state of health until yesterday patient's wife stated that he was last normal yesterday and becoming increasingly confused.  He had a blood glucose in the 400s for which his wife gave 27 units of insulin.  Was also found to be hypotensive by his physical therapist.  He is somnolent on my exam and is unable to provide history.   Clinical Impression  Pt admitted with above diagnosis. Pt received supine in bed agreeable to PT/OT co-treat. Pt reports recent baseline has been hoyer transfers with spouse but has been working on [employer] transfer with [place]. Reports wife dealing with medical issues or has been sick (pt unclear) and she is usually his 24/7 assist. Reports needing assist with ADL's/IADL's from her at baseline.   To date pt reliant on maxA+2 for all aspects of mobility with bed features, increased time, mod to max multimodal cuing for log roll positioning in and out of bed along with L lateral scoots to HOB. Pt attempts to perform all mobility without external support but is very laborious and unsuccessful without MaxA+2. Noted, static sitting balance as fair with close supervision. Education provided on improved scoot transfers with limited carryover after cuing with pt needing  assist at chuck pad to raise up to [facility]. Pt returning to supine maxA+2 to scoot up to [facility] and placed in chair position to eat breakfast. All needs in reach. Pt limited due to onset of new LBP since falling out of his hoyer lift and with wife having current difficulties unsure how well pt could manage at home  without care aid. Will plan for STR currently to optimize mobility and independence with bed mobility. Pt currently with functional limitations due to the deficits listed below (see PT Problem List). Pt will benefit from skilled PT to increase their independence and safety with mobility to allow discharge to the venue listed below.       Recommendations for follow up therapy are one component of a multi-disciplinary discharge planning process, led by the attending physician.  Recommendations may be updated based on patient status, additional functional criteria and insurance authorization.  Follow Up Recommendations Skilled nursing-short term rehab (<3 hours/day) Can patient physically be transported by private vehicle: No    Assistance Recommended at Discharge Frequent or constant Supervision/Assistance  Patient can return home with the following  Two people to help with bathing/dressing/bathroom;Two people to help with walking and/or transfers;Assistance with cooking/housework;Assist for transportation;Help with stairs or ramp for entrance    Equipment Recommendations None recommended by PT  Recommendations for Other Services       Functional Status Assessment Patient has had a recent decline in their functional status and demonstrates the ability to make significant improvements in function in a reasonable and predictable amount of time.     Precautions / Restrictions Precautions Precautions: Fall Restrictions Weight Bearing Restrictions: No Other Position/Activity Restrictions: L BKA      Mobility  Bed Mobility Overal bed mobility: Needs Assistance Bed Mobility: Supine to Sit, Sit to Supine     Supine to sit: +2 for physical assistance, HOB elevated Sit to supine: Max assist, +2 for  physical assistance     Patient Response: Cooperative  Transfers Overall transfer level: Needs assistance                 General transfer comment: Scoot transfers at bedside maxA  +2. Pt initiates scooting L to [facility] but very laborious, limited movement of buttocks without assist.    Ambulation/Gait               General Gait Details: pt does not ambulate at baseline  Stairs            Wheelchair Mobility    Modified Rankin (Stroke Patients Only)       Balance Overall balance assessment: Needs assistance Sitting-balance support: Bilateral upper extremity supported, Feet supported Sitting balance-Leahy Scale: Fair       Standing balance-Leahy Scale: Zero Standing balance comment: unable to stand at baseline                             Pertinent Vitals/Pain Pain Assessment Pain Assessment: Faces Faces Pain Scale: Hurts even more Pain Location: low back over hematoma Pain Descriptors / Indicators: Aching, Discomfort, Guarding, Grimacing Pain Intervention(s): Limited activity within patient's tolerance, Monitored during session, Repositioned    Home Living Family/patient expects to be discharged to:: Private residence Living Arrangements: Spouse/significant other Available Help at Discharge: Family;Available 24 hours/day Type of Home: House Home Access: Ramped entrance       Home Layout: One level Home Equipment: Conservation officer, nature (2 wheels);Cane - quad;Shower seat - built in Additional Comments: Bariatric RW; knee scooter    Prior Function Prior Level of Function : Needs assist       Physical Assist : Mobility (physical);ADLs (physical) Mobility (physical): Bed mobility;Transfers   Mobility Comments: Hoyer lifts with wife at baseline. [place] on [employer] transfers currently with HHPT ADLs Comments: Wife assists in ADL's/IADL's. Reports donning socks with sock aid     Hand Dominance        Extremity/Trunk Assessment   Upper Extremity Assessment Upper Extremity Assessment: Defer to OT evaluation    Lower Extremity Assessment Lower Extremity Assessment: Generalized weakness;LLE deficits/detail;RLE  deficits/detail RLE Deficits / Details: Notable toe amputations on R foot and great toe valgus LLE Deficits / Details: Baseline L BKA    Cervical / Trunk Assessment Cervical / Trunk Assessment: Normal  Communication   Communication: No difficulties  Cognition Arousal/Alertness: Awake/alert Behavior During Therapy: WFL for tasks assessed/performed Overall Cognitive Status: Within Functional Limits for tasks assessed                                          General Comments      Exercises Other Exercises Other Exercises: Role of PT in acute setting, log roll to assist in bed mobility due to LBP, lateral scoot transfers.   Assessment/Plan    PT Assessment Patient needs continued PT services  PT Problem List Decreased strength;Decreased range of motion;Decreased activity tolerance;Decreased balance;Pain;Decreased mobility       PT Treatment Interventions DME instruction;Balance training;Gait training;Neuromuscular re-education;Functional mobility training;Patient/family education;Therapeutic activities;Therapeutic exercise    PT Goals (Current goals can be found in the Care Plan section)  Acute Rehab PT Goals Patient Stated Goal: improve LBP from hematoma PT Goal Formulation: With patient Time For Goal Achievement: 04/03/22 Potential to Achieve Goals: Fair    Frequency Min 2X/week  Co-evaluation PT/OT/SLP Co-Evaluation/Treatment: Yes Reason for Co-Treatment: Complexity of the patient's impairments (multi-system involvement);For patient/therapist safety PT goals addressed during session: Mobility/safety with mobility;Proper use of DME OT goals addressed during session: ADL's and self-care       AM-PAC PT "6 Clicks" Mobility  Outcome Measure Help needed turning from your back to your side while in a flat bed without using bedrails?: A Lot Help needed moving from lying on your back to sitting on the side of a flat bed without using bedrails?: A  Lot Help needed moving to and from a bed to a chair (including a wheelchair)?: Total Help needed standing up from a chair using your arms (e.g., wheelchair or bedside chair)?: Total Help needed to walk in hospital room?: Total Help needed climbing 3-5 steps with a railing? : Total 6 Click Score: 8    End of Session Equipment Utilized During Treatment: Oxygen Activity Tolerance: Patient limited by fatigue;Patient limited by pain Patient left: in bed;with call bell/phone within reach;with bed alarm set Nurse Communication: Mobility status PT Visit Diagnosis: Other abnormalities of gait and mobility (R26.89);Muscle weakness (generalized) (M62.81);Pain Pain - part of body:  (low back)    Time: 9417-4081 PT Time Calculation (min) (ACUTE ONLY): 31 min   Charges:   PT Evaluation $PT Eval Moderate Complexity: 1 Mod PT Treatments $Therapeutic Activity: 8-22 mins        Preesha Benjamin M. Fairly IV, PT, DPT Physical Therapist- [facility]  03/20/2022, 9:35 AM

## 2022-03-20 NOTE — Plan of Care (Signed)
  Problem: Pain Managment: Goal: General experience of comfort will improve Outcome: Progressing   Problem: Safety: Goal: Ability to remain free from injury will improve Outcome: Progressing   Problem: Skin Integrity: Goal: Risk for impaired skin integrity will decrease Outcome: Progressing   

## 2022-03-20 NOTE — Evaluation (Signed)
Occupational Therapy Evaluation Patient Details Name: [name]. MRN: [id number] DOB: 1955/10/03 Today's Date: 03/20/2022   History of Present Illness [name] is a 66 y.o. male with a known history of atrial fibrillation on Eliquis, CHF, COPD, CAD, diabetes, hypertension, hyperlipidemia presents to the emergency department for evaluation of confusion and poor p.o. intake.  Patient was in a usual state of health until yesterday patient's wife stated that he was last normal yesterday and becoming increasingly confused.  He had a blood glucose in the 400s for which his wife gave 27 units of insulin.  Was also found to be hypotensive by his physical therapist.   Clinical Impression   [name] was seen for OT evaluation this date. Prior to hospital admission, pt resided at home with his spouse. Pt reports using a Actuary for functional transfers between surfaces. He states he tries to be independent with ADL management but does require intermittent assistance from his spouse. Pt reports recent fall from his hoyer lift which he was operating on his own but denies additional falls history in past six months. Pt presents to acute OT demonstrating impaired ADL performance and functional mobility 2/2 decreased activity tolerance, generalized weakness and increased pain (See OT problem list). He states "I feel like I've been beat to hell with a ball bat". Pt currently requires MAX A +2 for bed mobility and lateral scooting attempts at the EOB.  Pt would benefit from skilled OT services to address noted impairments and functional limitations (see below for any additional details) in order to maximize safety and independence while minimizing falls risk, caregiver burden, and re-admission. Upon hospital discharge, recommend STR to maximize pt safety and return to PLOF.        Recommendations for follow up therapy are one component of a multi-disciplinary discharge planning  process, led by the attending physician.  Recommendations may be updated based on patient status, additional functional criteria and insurance authorization.   Follow Up Recommendations  Skilled nursing-short term rehab (<3 hours/day)     Assistance Recommended at Discharge Frequent or constant Supervision/Assistance  Patient can return home with the following Two people to help with walking and/or transfers;Two people to help with bathing/dressing/bathroom;Assistance with cooking/housework;Assist for transportation;Help with stairs or ramp for entrance    Functional Status Assessment  Patient has had a recent decline in their functional status and demonstrates the ability to make significant improvements in function in a reasonable and predictable amount of time.  Equipment Recommendations       Recommendations for Other Services       Precautions / Restrictions Precautions Precautions: Fall Restrictions Weight Bearing Restrictions: No Other Position/Activity Restrictions: L BKA      Mobility Bed Mobility Overal bed mobility: Needs Assistance Bed Mobility: Supine to Sit, Sit to Supine     Supine to sit: +2 for physical assistance, HOB elevated Sit to supine: Max assist, +2 for physical assistance        Transfers Overall transfer level: Needs assistance                 General transfer comment: Scoot transfers at bedside maxA +2. Pt initiates scooting L to [facility] but very laborious, limited movement of buttocks without assist.      Balance Overall balance assessment: Needs assistance Sitting-balance support: Bilateral upper extremity supported, Feet supported Sitting balance-Leahy Scale: Fair  ADL either performed or assessed with clinical judgement   ADL Overall ADL's : Needs assistance/impaired                                       General ADL Comments: Pt functionally limited by  generalized weakness, increased pain with mobility, and decreased activity tolerance. He requires MAX A +2 for bed mobility and lateral scoots at EOB. MAX A for LB dressing (don hospital sock) and peri-care at bed level.     Vision Patient Visual Report: No change from baseline       Perception     Praxis      Pertinent Vitals/Pain Pain Assessment Faces Pain Scale: Hurts whole lot Pain Location: low back over hematoma Pain Descriptors / Indicators: Aching, Discomfort, Guarding, Grimacing Pain Intervention(s): Limited activity within patient's tolerance, Monitored during session, Repositioned     Hand Dominance Right   Extremity/Trunk Assessment Upper Extremity Assessment Upper Extremity Assessment: Overall WFL for tasks assessed (Pain limited with mobility.)   Lower Extremity Assessment Lower Extremity Assessment: Generalized weakness;LLE deficits/detail;RLE deficits/detail RLE Deficits / Details: Notable toe amputations on R foot and great toe valgus LLE Deficits / Details: Baseline L BKA   Cervical / Trunk Assessment Cervical / Trunk Assessment: Normal   Communication Communication Communication: No difficulties   Cognition Arousal/Alertness: Awake/alert Behavior During Therapy: WFL for tasks assessed/performed Overall Cognitive Status: Within Functional Limits for tasks assessed                                 General Comments: A&Ox4, follows VCs consistently.     General Comments       Exercises Other Exercises Other Exercises: Pt educated on role of OT in acute setting, bed mobility techniques, and routines modifications to support safety and functional independence during ADL management.   Shoulder Instructions      Home Living Family/patient expects to be discharged to:: Private residence Living Arrangements: Spouse/significant other Available Help at Discharge: Family;Available 24 hours/day Type of Home: House Home Access: Ramped  entrance     Home Layout: One level     Bathroom Shower/Tub: Tub/shower unit;Sponge bathes at baseline   Bathroom Toilet: Handicapped height     Home Equipment: Conservation officer, nature (2 wheels);Cane - quad;Shower seat - built Hotel manager: Tax inspector Additional Comments: Bariatric RW; knee scooter, hoyer, slide board      Prior Functioning/Environment Prior Level of Function : Needs assist       Physical Assist : Mobility (physical);ADLs (physical) Mobility (physical): Bed mobility;Transfers ADLs (physical): IADLs;Bathing Mobility Comments: Hoyer lifts with wife at baseline. [place] [employer] transfers currently with HHPT ADLs Comments: Wife assists in ADL's/IADL's. Reports donning socks with sock aid and working to be as independent as possible.        OT Problem List: Decreased strength;Decreased range of motion;Decreased coordination;Pain;Decreased activity tolerance;Decreased safety awareness;Impaired balance (sitting and/or standing);Decreased knowledge of use of DME or AE      OT Treatment/Interventions: Self-care/ADL training;Therapeutic exercise;Therapeutic activities;DME and/or AE instruction;Patient/family education;Balance training;Energy conservation    OT Goals(Current goals can be found in the care plan section) Acute Rehab OT Goals Patient Stated Goal: To feel better OT Goal Formulation: With patient Time For Goal Achievement: 04/03/22 Potential to Achieve Goals: Good ADL Goals Pt Will Perform Grooming: sitting;with set-up;with supervision Pt Will  Perform Lower Body Dressing: sitting/lateral leans;with adaptive equipment;with set-up;with supervision Pt Will Transfer to Toilet: bedside commode;with mod assist;with transfer board (drop arm commode; slide board) Pt Will Perform Toileting - Clothing Manipulation and hygiene: sitting/lateral leans;with supervision;with set-up;with adaptive equipment  OT Frequency: Min 2X/week     Co-evaluation   Reason for Co-Treatment: To address functional/ADL transfers;For patient/therapist safety;Complexity of the patient's impairments (multi-system involvement) PT goals addressed during session: Mobility/safety with mobility;Proper use of DME OT goals addressed during session: ADL's and self-care;Proper use of Adaptive equipment and DME      AM-PAC OT "6 Clicks" Daily Activity     Outcome Measure Help from another person eating meals?: A Little Help from another person taking care of personal grooming?: A Little Help from another person toileting, which includes using toliet, bedpan, or urinal?: A Lot Help from another person bathing (including washing, rinsing, drying)?: A Lot Help from another person to put on and taking off regular upper body clothing?: A Lot Help from another person to put on and taking off regular lower body clothing?: A Lot 6 Click Score: 14   End of Session    Activity Tolerance: Patient tolerated treatment well Patient left: in bed;with call bell/phone within reach;with bed alarm set;Other (comment) (With lab staff in room for blood draw.)  OT Visit Diagnosis: Other abnormalities of gait and mobility (R26.89);Muscle weakness (generalized) (M62.81);History of falling (Z91.81);Pain Pain - Right/Left: Right Pain - part of body:  (R flank pain and kidney pain)                Time: 4259-5638 OT Time Calculation (min): 33 min Charges:  OT General Charges $OT Visit: 1 Visit OT Evaluation $OT Eval Moderate Complexity: 1 Mod OT Treatments $Self Care/Home Management : 8-22 mins  Shara Blazing, M.S., OTR/L 03/20/22, 11:08 AM

## 2022-03-20 NOTE — Progress Notes (Addendum)
Progress Note    [name].  HCW:[id number] DOB: 01-24-1956  DOA: 03/15/2022 PCP: [name], MD      Brief Narrative:    Medical records reviewed and are as summarized below:  Alec Less. is a 66 y.o. male with medical history significant for asthma, COPD, CHF, diabetes, history of left BKA, BPH, hypertension, dyslipidemia, hypothyroidism, recent hospitalization for fall and atrial fibrillation with RVR in October 2023, s/p cardioversion on 02/13/2022.  He presented to the hospital because of confusion and hyperglycemia.  His wife said he had tried to use the lift to get patient into the bed the night prior to admission but was unsuccessful and patient ended up on the floor.  His fingerstick blood sugar was 579 on admission and 604 on BMP.  He was hypotensive with BP of 73/43, tachycardic with pulse of 108, afebrile, lactic acid 2.2, sodium 125, bicarb 21, creatinine 2.3, WBC 11.4, hemoglobin 8.8 and platelet count 124.  CT abdomen and pelvis showed large subcapsular hematoma of the right kidney and large amount of gas concerning for emphysematous pyelonephritis.  He was admitted to the hospital for septic shock secondary to acute UTI.  He was treated with IV fluids and empiric IV antibiotics.  He also required Levophed for septic shock.  He was also treated with IV insulin drip for hyperosmolar hyperglycemic state.  He had AKI and hyponatremia that improved with IV fluids.        Assessment/Plan:   Principal Problem:   Septic shock (HCC) Active Problems:   Sepsis secondary to UTI ([facility])   Hyperglycemia   Bacteremia due to Klebsiella pneumoniae   Perinephric hematoma    Body mass index is 37.78 kg/m.  (Obesity)    Severe sepsis with septic shock secondary to acute UTI/emphysematous pyelonephritis, Klebsiella pneumoniae bacteremia: Continue IV cefazolin.  No growth on repeat blood cultures.  Right kidney subcapsular hematoma: Repeat CT  abdomen pelvis showed right kidney subcapsular hematoma.  Urologist recommended against percutaneous drainage at this time.  Outpatient follow-up with urologist in 2 weeks was recommended.     Moderate right pleural effusion, very small left pleural effusion: Suspected to be reactive.  Plan for right thoracentesis today  Generalized weakness: PT and OT recommended discharge to SNF   Chronic hypoxic respiratory failure: Patient confirmed that he uses 2 L/min oxygen at home.   Type II DM with hyperglycemia: Continue Levemir and NovoLog   Paroxysmal atrial fibrillation and atrial flutter s/p cardioversion on 02/13/2022: Continue amiodarone.  Hold Eliquis for now because of increased risk of bleeding.  This was discussed (via secure chat on 03/20/2022) with Dr. Hollice Espy, urologist, on call.   Acute metabolic encephalopathy: Improved   AKI: Improving.  Baseline creatinine is around 1.   Diastolic CHF and COPD: Compensated.  2D echo in June 2023 showed EF estimated at 55 to 60%, no normal LV diastolic parameters   Other comorbidities include hypothyroidism, hepatic steatosis    Diet Order             Diet Carb Modified Fluid consistency: Thin; Room service appropriate? Yes  Diet effective now                            Consultants: Intensivist Urologist  Procedures: None    Medications:    amiodarone  200 mg Oral Daily   ascorbic acid  500 mg Oral Daily  budesonide  0.5 mg Nebulization BID   Chlorhexidine Gluconate Cloth  6 each Topical Daily   cholecalciferol  1,000 Units Oral Daily   cyanocobalamin  1,000 mcg Oral Daily   ezetimibe  10 mg Oral Daily   ferrous sulfate  325 mg Oral Q breakfast   finasteride  5 mg Oral Daily   insulin aspart  0-20 Units Subcutaneous Q4H   insulin detemir  15 Units Subcutaneous BID   levothyroxine  25 mcg Oral Q0600   multivitamin with minerals  1 tablet Oral Daily   pantoprazole  40 mg Oral Daily   primidone   250 mg Oral BID   tamsulosin  0.4 mg Oral Daily   Continuous Infusions:  sodium chloride Stopped (03/16/22 0018)   sodium chloride Stopped (03/18/22 1729)    ceFAZolin (ANCEF) IV 2 g (03/20/22 0533)     Anti-infectives (From admission, onward)    Start     Dose/Rate Route Frequency Ordered Stop   03/18/22 1400  ceFAZolin (ANCEF) IVPB 1 g/50 mL premix  Status:  Discontinued        1 g 100 mL/hr over 30 Minutes Intravenous Every 8 hours 03/18/22 0739 03/18/22 0739   03/18/22 1400  ceFAZolin (ANCEF) IVPB 2g/100 mL premix        2 g 200 mL/hr over 30 Minutes Intravenous Every 8 hours 03/18/22 0739     03/16/22 1500  levofloxacin (LEVAQUIN) IVPB 750 mg  Status:  Discontinued        750 mg 100 mL/hr over 90 Minutes Intravenous Every 48 hours 03/16/22 1408 03/16/22 1415   03/16/22 1200  cefTRIAXone (ROCEPHIN) 2 g in sodium chloride 0.9 % 100 mL IVPB  Status:  Discontinued        2 g 200 mL/hr over 30 Minutes Intravenous Every 24 hours 03/16/22 0507 03/18/22 0739   03/16/22 0300  ceFEPIme (MAXIPIME) 2 g in sodium chloride 0.9 % 100 mL IVPB  Status:  Discontinued        2 g 200 mL/hr over 30 Minutes Intravenous Every 12 hours 03/15/22 2030 03/16/22 0505   03/15/22 2200  ceFEPIme (MAXIPIME) 1 g in sodium chloride 0.9 % 100 mL IVPB  Status:  Discontinued        1 g 200 mL/hr over 30 Minutes Intravenous Every 8 hours 03/15/22 2011 03/15/22 2029   03/15/22 2200  vancomycin variable dose per unstable renal function (pharmacist dosing)  Status:  Discontinued         Does not apply See admin instructions 03/15/22 2032 03/17/22 1154   03/15/22 1630  vancomycin (VANCOREADY) IVPB 1500 mg/300 mL       See Hyperspace for full Linked Orders Report.   1,500 mg 150 mL/hr over 120 Minutes Intravenous  Once 03/15/22 1526 03/15/22 2104   03/15/22 1530  vancomycin (VANCOCIN) IVPB 1000 mg/200 mL premix       See Hyperspace for full Linked Orders Report.   1,000 mg 200 mL/hr over 60 Minutes Intravenous   Once 03/15/22 1526 03/15/22 1736   03/15/22 1515  ceFEPIme (MAXIPIME) 2 g in sodium chloride 0.9 % 100 mL IVPB        2 g 200 mL/hr over 30 Minutes Intravenous  Once 03/15/22 1512 03/15/22 1614   03/15/22 1515  vancomycin (VANCOCIN) IVPB 1000 mg/200 mL premix  Status:  Discontinued        1,000 mg 200 mL/hr over 60 Minutes Intravenous  Once 03/15/22 1512 03/15/22 1524  Family Communication/Anticipated D/C date and plan/Code Status   DVT prophylaxis: Place and maintain sequential compression device Start: 03/16/22 0633 SCDs Start: 03/15/22 2012     Code Status: Full Code  Family Communication: Called wife at 6:42 pm today but it went to voicemail  Disposition Plan: Plan to discharge to SNF   Status is: Inpatient Remains inpatient appropriate because: IV antibiotics       Subjective:   Interval events noted.  Complains of cough.  No shortness of breath, chest pain or abdominal pain  Objective:    Vitals:   03/20/22 0749 03/20/22 0933 03/20/22 1207 03/20/22 1232  BP:  133/79 127/78   Pulse:  96 (!) 51 97  Resp:  17 16   Temp:  98.8 F (37.1 C) 98.2 F (36.8 C)   TempSrc:  Oral    SpO2: 97% 99% (!) 88% 94%  Weight:      Height:       No data found.   Intake/Output Summary (Last 24 hours) at 03/20/2022 1258 Last data filed at 03/20/2022 0900 Gross per 24 hour  Intake 1125.95 ml  Output 2900 ml  Net -1774.05 ml   Filed Weights   03/15/22 1448 03/19/22 0346 03/20/22 0526  Weight: (!) 146.9 kg (!) 139.3 kg (!) 140.8 kg    Exam:  GEN: NAD SKIN: Warm and dry EYES: No pallor or icterus ENT: MMM CV: RRR PULM: CTA B ABD: soft, ND, NT, +BS CNS: AAO x 3, non focal EXT: Left BKA      Data Reviewed:   I have personally reviewed following labs and imaging studies:  Labs: Labs show the following:   Basic Metabolic Panel: Recent Labs  Lab 03/15/22 2109 03/16/22 0257 03/16/22 1800 03/17/22 0318 03/18/22 0422 03/19/22 0439  03/20/22 0439  NA 127*   < > 131* 133* 133* 139 141  K 3.7   < > 3.9 3.8 3.7 3.5 3.7  CL 92*   < > 97* 100 102 109 109  CO2 24   < > '22 24 24 26 28  '$ GLUCOSE 485*   < > 268* 226* 199* 156* 110*  BUN 50*   < > 55* 53* 42* 32* 28*  CREATININE 2.23*   < > 2.24* 2.06* 1.45* 1.43* 1.31*  CALCIUM 8.0*   < > 7.9* 8.0* 8.2* 8.2* 8.1*  MG 1.9  --   --  1.9 1.9 1.9  --   PHOS 3.9  --   --  4.0 2.4* 2.6  --    < > = values in this interval not displayed.   GFR Estimated Creatinine Clearance: 83.9 mL/min (A) (by C-G formula based on SCr of 1.31 mg/dL (H)). Liver Function Tests: Recent Labs  Lab 03/15/22 1519 03/20/22 0439  AST 26 26  ALT 19 23  ALKPHOS 85 159*  BILITOT 2.1* 0.5  PROT 6.7 5.5*  ALBUMIN 3.2* 2.1*   Recent Labs  Lab 03/16/22 0257  LIPASE 22   No results for input(s): "AMMONIA" in the last 168 hours. Coagulation profile Recent Labs  Lab 03/15/22 1519 03/16/22 0703  INR 1.6* 1.4*    CBC: Recent Labs  Lab 03/15/22 1519 03/16/22 1320 03/16/22 2007 03/17/22 0318 03/18/22 0422 03/20/22 0439  WBC 11.4*  --  7.0 7.3 6.8 7.7  NEUTROABS 9.8*  --   --   --   --  5.3  HGB 8.8* 8.2* 7.6* 8.1* 7.9* 8.4*  HCT 27.1* 24.7* 23.3* 24.3* 23.4* 26.5*  MCV  92.2  --  90.3 90.7 90.7 93.3  PLT 124*  --  104* 104* 122* 201   Cardiac Enzymes: Recent Labs  Lab 03/15/22 1519  CKTOTAL 212   BNP (last 3 results) No results for input(s): "PROBNP" in the last 8760 hours. CBG: Recent Labs  Lab 03/19/22 2349 03/20/22 0126 03/20/22 0528 03/20/22 0901 03/20/22 1211  GLUCAP 183* 157* 94 93 154*   D-Dimer: No results for input(s): "DDIMER" in the last 72 hours. Hgb A1c: No results for input(s): "HGBA1C" in the last 72 hours. Lipid Profile: No results for input(s): "CHOL", "HDL", "LDLCALC", "TRIG", "CHOLHDL", "LDLDIRECT" in the last 72 hours. Thyroid function studies: No results for input(s): "TSH", "T4TOTAL", "T3FREE", "THYROIDAB" in the last 72 hours.  Invalid  input(s): "FREET3" Anemia work up: No results for input(s): "VITAMINB12", "FOLATE", "FERRITIN", "TIBC", "IRON", "RETICCTPCT" in the last 72 hours. Sepsis Labs: Recent Labs  Lab 03/15/22 1519 03/15/22 1720 03/15/22 2109 03/15/22 2241 03/16/22 0045 03/16/22 2007 03/17/22 0318 03/18/22 0422 03/20/22 0439  PROCALCITON  --   --   --   --  14.20  --  7.33 3.48  --   WBC 11.4*  --   --   --   --  7.0 7.3 6.8 7.7  LATICACIDVEN 2.2* 1.9 2.1* 1.7  --   --   --   --   --     Microbiology Recent Results (from the past 240 hour(s))  Resp Panel by RT-PCR (Flu A&B, Covid) Anterior Nasal Swab     Status: None   Collection Time: 03/15/22  3:19 PM   Specimen: Anterior Nasal Swab  Result Value Ref Range Status   SARS Coronavirus 2 by RT PCR NEGATIVE NEGATIVE Final    Comment: (NOTE) SARS-CoV-2 target nucleic acids are NOT DETECTED.  The SARS-CoV-2 RNA is generally detectable in upper respiratory specimens during the acute phase of infection. The lowest concentration of SARS-CoV-2 viral copies this assay can detect is 138 copies/mL. A negative result does not preclude SARS-Cov-2 infection and should not be used as the sole basis for treatment or other patient management decisions. A negative result may occur with  improper specimen collection/handling, submission of specimen other than nasopharyngeal swab, presence of viral mutation(s) within the areas targeted by this assay, and inadequate number of viral copies(<138 copies/mL). A negative result must be combined with clinical observations, patient history, and epidemiological information. The expected result is Negative.  Fact Sheet for Patients:  EntrepreneurPulse.com.au  Fact Sheet for Healthcare Providers:  IncredibleEmployment.be  This test is no t yet approved or cleared by the [place] FDA and  has been authorized for detection and/or diagnosis of SARS-CoV-2 by FDA under an Emergency Use  Authorization (EUA). This EUA will remain  in effect (meaning this test can be used) for the duration of the COVID-19 declaration under Section 564(b)(1) of the Act, 21 U.S.C.section 360bbb-3(b)(1), unless the authorization is terminated  or revoked sooner.       Influenza A by PCR NEGATIVE NEGATIVE Final   Influenza B by PCR NEGATIVE NEGATIVE Final    Comment: (NOTE) The Xpert Xpress SARS-CoV-2/FLU/RSV plus assay is intended as an aid in the diagnosis of influenza from Nasopharyngeal swab specimens and should not be used as a sole basis for treatment. Nasal washings and aspirates are unacceptable for Xpert Xpress SARS-CoV-2/FLU/RSV testing.  Fact Sheet for Patients: EntrepreneurPulse.com.au  Fact Sheet for Healthcare Providers: IncredibleEmployment.be  This test is not yet approved or cleared by the [place]  FDA and has been authorized for detection and/or diagnosis of SARS-CoV-2 by FDA under an Emergency Use Authorization (EUA). This EUA will remain in effect (meaning this test can be used) for the duration of the COVID-19 declaration under Section 564(b)(1) of the Act, 21 U.S.C. section 360bbb-3(b)(1), unless the authorization is terminated or revoked.  Performed at [facility], [address] [place], [place] [address]   Blood Culture (routine x 2)     Status: Abnormal   Collection Time: 03/15/22  3:19 PM   Specimen: BLOOD LEFT HAND  Result Value Ref Range Status   Specimen Description   Final    BLOOD LEFT HAND Performed at [facility], [address] [place], [place] [address]    Special Requests   Final    BOTTLES DRAWN AEROBIC AND ANAEROBIC Blood Culture adequate volume Performed at [facility], [address] [place], [place] [address]    Culture  Setup Time   Final    GRAM NEGATIVE RODS IN BOTH AEROBIC AND ANAEROBIC BOTTLES CRITICAL RESULT CALLED TO, READ BACK BY AND VERIFIED  WITH: NATHAN BELUE 03/16/2022 AT 0408 SRR Performed at [facility], [place] [address], [place], [place] [address]    Culture KLEBSIELLA PNEUMONIAE (A)  Final   Report Status 03/18/2022 FINAL  Final   Organism ID, Bacteria KLEBSIELLA PNEUMONIAE  Final      Susceptibility   Klebsiella pneumoniae - MIC*    AMPICILLIN >=32 RESISTANT Resistant     CEFAZOLIN <=4 SENSITIVE Sensitive     CEFEPIME <=0.12 SENSITIVE Sensitive     CEFTAZIDIME <=1 SENSITIVE Sensitive     CEFTRIAXONE <=0.25 SENSITIVE Sensitive     CIPROFLOXACIN <=0.25 SENSITIVE Sensitive     GENTAMICIN <=1 SENSITIVE Sensitive     IMIPENEM <=0.25 SENSITIVE Sensitive     TRIMETH/SULFA <=20 SENSITIVE Sensitive     AMPICILLIN/SULBACTAM 4 SENSITIVE Sensitive     PIP/TAZO <=4 SENSITIVE Sensitive     * KLEBSIELLA PNEUMONIAE  Urine Culture     Status: Abnormal   Collection Time: 03/15/22  3:19 PM   Specimen: In/Out Cath Urine  Result Value Ref Range Status   Specimen Description   Final    IN/OUT CATH URINE Performed at [facility], [address] [place], [place] [address]    Special Requests   Final    NONE Performed at [facility], [place] [place],  [address]    Culture >=100,000 COLONIES/mL KLEBSIELLA PNEUMONIAE (A)  Final   Report Status 03/17/2022 FINAL  Final   Organism ID, Bacteria KLEBSIELLA PNEUMONIAE (A)  Final      Susceptibility   Klebsiella pneumoniae - MIC*    AMPICILLIN >=32 RESISTANT Resistant     CEFAZOLIN <=4 SENSITIVE Sensitive     CEFEPIME <=0.12 SENSITIVE Sensitive     CEFTRIAXONE <=0.25 SENSITIVE Sensitive     CIPROFLOXACIN <=0.25 SENSITIVE Sensitive     GENTAMICIN <=1 SENSITIVE Sensitive     IMIPENEM <=0.25 SENSITIVE Sensitive     NITROFURANTOIN 64 INTERMEDIATE Intermediate     TRIMETH/SULFA <=20 SENSITIVE Sensitive     AMPICILLIN/SULBACTAM 4 SENSITIVE Sensitive     PIP/TAZO <=4 SENSITIVE Sensitive     * >=100,000 COLONIES/mL KLEBSIELLA PNEUMONIAE  Blood  Culture ID Panel (Reflexed)     Status: Abnormal   Collection Time: 03/15/22  3:19 PM  Result Value Ref Range Status   Enterococcus faecalis NOT DETECTED NOT DETECTED Final   Enterococcus Faecium NOT DETECTED NOT  DETECTED Final   Listeria monocytogenes NOT DETECTED NOT DETECTED Final   Staphylococcus species NOT DETECTED NOT DETECTED Final   Staphylococcus aureus (BCID) NOT DETECTED NOT DETECTED Final   Staphylococcus epidermidis NOT DETECTED NOT DETECTED Final   Staphylococcus lugdunensis NOT DETECTED NOT DETECTED Final   Streptococcus species NOT DETECTED NOT DETECTED Final   Streptococcus agalactiae NOT DETECTED NOT DETECTED Final   Streptococcus pneumoniae NOT DETECTED NOT DETECTED Final   Streptococcus pyogenes NOT DETECTED NOT DETECTED Final   A.calcoaceticus-baumannii NOT DETECTED NOT DETECTED Final   Bacteroides fragilis NOT DETECTED NOT DETECTED Final   Enterobacterales DETECTED (A) NOT DETECTED Final    Comment: Enterobacterales represent a large order of gram negative bacteria, not a single organism. CRITICAL RESULT CALLED TO, READ BACK BY AND VERIFIED WITH: NATHAN BELUE 03/16/2022 AT 0408 SRR    Enterobacter cloacae complex NOT DETECTED NOT DETECTED Final   Escherichia coli NOT DETECTED NOT DETECTED Final   Klebsiella aerogenes NOT DETECTED NOT DETECTED Final   Klebsiella oxytoca NOT DETECTED NOT DETECTED Final   Klebsiella pneumoniae DETECTED (A) NOT DETECTED Final    Comment: CRITICAL RESULT CALLED TO, READ BACK BY AND VERIFIED WITH: NATHAN BELUE 03/16/2022 AT 0408 SRR    Proteus species NOT DETECTED NOT DETECTED Final   Salmonella species NOT DETECTED NOT DETECTED Final   Serratia marcescens NOT DETECTED NOT DETECTED Final   Haemophilus influenzae NOT DETECTED NOT DETECTED Final   Neisseria meningitidis NOT DETECTED NOT DETECTED Final   Pseudomonas aeruginosa NOT DETECTED NOT DETECTED Final   Stenotrophomonas maltophilia NOT DETECTED NOT DETECTED Final   Candida  albicans NOT DETECTED NOT DETECTED Final   Candida auris NOT DETECTED NOT DETECTED Final   Candida glabrata NOT DETECTED NOT DETECTED Final   Candida krusei NOT DETECTED NOT DETECTED Final   Candida parapsilosis NOT DETECTED NOT DETECTED Final   Candida tropicalis NOT DETECTED NOT DETECTED Final   Cryptococcus neoformans/gattii NOT DETECTED NOT DETECTED Final   CTX-M ESBL NOT DETECTED NOT DETECTED Final   Carbapenem resistance IMP NOT DETECTED NOT DETECTED Final   Carbapenem resistance KPC NOT DETECTED NOT DETECTED Final   Carbapenem resistance NDM NOT DETECTED NOT DETECTED Final   Carbapenem resist OXA 48 LIKE NOT DETECTED NOT DETECTED Final   Carbapenem resistance VIM NOT DETECTED NOT DETECTED Final    Comment: Performed at [facility], [address] [place], [place] [address]  Blood Culture (routine x 2)     Status: None   Collection Time: 03/15/22  9:09 PM   Specimen: BLOOD  Result Value Ref Range Status   Specimen Description BLOOD RIGHT HAND  Final   Special Requests   Final    BOTTLES DRAWN AEROBIC AND ANAEROBIC Blood Culture results may not be optimal due to an inadequate volume of blood received in culture bottles   Culture   Final    NO GROWTH 5 DAYS Performed at [facility], [address], [place], [place] [address]    Report Status 03/20/2022 FINAL  Final  MRSA Next Gen by PCR, Nasal     Status: Abnormal   Collection Time: 03/15/22 10:02 PM   Specimen: Nasal Mucosa; Nasal Swab  Result Value Ref Range Status   MRSA by PCR Next Gen DETECTED (A) NOT DETECTED Final    Comment: RESULT CALLED TO, READ BACK BY AND VERIFIED WITH: Burnetta Sabin 03/15/2022 AT 2329 SRR (NOTE) The GeneXpert MRSA Assay (FDA approved for NASAL specimens only), is one component of a  comprehensive MRSA colonization surveillance program. It is not intended to diagnose MRSA infection nor to guide or monitor treatment for MRSA infections. Test performance is not FDA  approved in patients less than 41 years old. Performed at [facility], [address] [place], [place] [address]     Procedures and diagnostic studies:  CT ABDOMEN PELVIS WO CONTRAST  Result Date: 03/19/2022 CLINICAL DATA:  History of pyelonephritis by report also found to have perinephric hematoma EXAM: CT ABDOMEN AND PELVIS WITHOUT CONTRAST TECHNIQUE: Multidetector CT imaging of the abdomen and pelvis was performed following the standard protocol without IV contrast. RADIATION DOSE REDUCTION: This exam was performed according to the departmental dose-optimization program which includes automated exposure control, adjustment of the mA and/or kV according to patient size and/or use of iterative reconstruction technique. COMPARISON:  March 16, 2022 FINDINGS: Lower chest: Moderate RIGHT effusion. Basilar airspace disease is slightly increased in the RIGHT chest as is the pleural fluid. Very small LEFT pleural effusion slightly increased as well. Aortic valvular calcifications, mitral annular calcifications and coronary artery disease similar to previous imaging. Low attenuation in the cardiac blood pool. Hepatobiliary: Smooth hepatic contours. No pericholecystic stranding. No gross biliary duct distension. No visible lesion on noncontrast imaging. Pancreas: Pancreas with normal contours, no signs of inflammation or peripancreatic fluid. Spleen: Normal. Adrenals/Urinary Tract: Adrenal glands are normal. RIGHT kidney: Large perinephric and subcapsular hematoma with compression of renal parenchyma. At the level of the interpolar RIGHT kidney this measures approximately 8 x 4.2 cm previously 8.3 x 4.3 cm. Hematoma extends into the perinephric space. Density of this hematoma is similar to previous imaging. The amount of hematoma in the inferior perinephric and posterior pararenal space is perhaps very slightly diminished. No nephrolithiasis. No ureteral calculi or obstruction on the RIGHT. Gas  along the RIGHT renal parenchyma and or collecting system elements of the lower and interpolar RIGHT kidney have diminished in terms of components extending into are involving potentially the renal parenchyma. Gas tracks inferiorly into the hematoma immediately subjacent to the RIGHT kidney. In the coronal plane gas measures 5.7 x 2.4 cm along the lower margin of the subcapsular portion of the hematoma previously 5.5 x 3.0 cm. Smaller amounts of gas are seen in the perinephric aspect of the hematoma that were not present previously,, for instance on image 61/5 where there is a 1.6 x 2 cm area of gas within this inferior perinephric hematoma and small amounts of gas tracking from the subcapsular hematoma into the perinephric hematoma. Perinephric hematoma as measured in the coronal plane (image 61/5 6.1 x 2.8 cm as compared to 7.5 x 3.1 cm. LEFT kidney: No signs of hematoma, nephrolithiasis or hydronephrosis on the LEFT. Renal cortical scarring on the LEFT. Perinephric stranding on the LEFT. Stomach/Bowel: No acute gastrointestinal findings. Vascular/Lymphatic: Aortic atherosclerosis. No sign of aneurysm. Smooth contour of the IVC. There is no gastrohepatic or hepatoduodenal ligament lymphadenopathy. No retroperitoneal or mesenteric lymphadenopathy. No pelvic sidewall lymphadenopathy. Atherosclerotic changes are moderate and calcified. Reproductive: Unremarkable by CT. Other: Body wall edema. Stranding throughout the RIGHT retroperitoneum. Musculoskeletal: Post sternotomy. Spinal degenerative changes. No acute or destructive bone process. IMPRESSION: 1. Persistent large subcapsular hematoma with compression of renal parenchyma not substantially changed in size though potentially with slight decreased size of hematoma in the perinephric space and pararenal space below the RIGHT kidney. 2. Diminished gas within RIGHT renal parenchyma and or collecting system elements of the RIGHT kidney but with propagation of gas into  the perinephric/inferior  pararenal extension of the hematoma. Findings may reflect partial decompression of gas collection in the subcapsular space on the previous study though may show very slight increase overall since previous imaging. While the amount of parenchymal or collecting system involvement currently appears less, findings remain consistent with gas-forming infection and could be a result of emphysematous pyelonephritis. Development of gas outside of the renal capsule may herald early retroperitoneal extension of this process. 3. Moderate RIGHT effusion slightly increased in the RIGHT chest as is the pleural fluid. Very small LEFT pleural effusion slightly increased as well. Findings could be the result of reactive effusion in the RIGHT chest. Correlate with any signs of respiratory. Infection 4. Low attenuation in the cardiac blood pool. This can be seen in the setting of anemia. 5. Aortic atherosclerosis and coronary artery disease. Aortic Atherosclerosis (ICD10-I70.0). These results will be called to the ordering clinician or representative by the Radiologist Assistant, and communication documented in the PACS or [employer]. Electronically Signed   By: Zetta Bills M.D.   On: 03/19/2022 10:49               LOS: 5 days   [name]  [facility]   Pager on [email or web address]. If 7PM-7AM, please contact night-coverage at [email or web address]     03/20/2022, 12:58 PM

## 2022-03-20 NOTE — Progress Notes (Signed)
Pt's wife called asking for MD update regarding imaging from yesterday and thoracentesis results today. Jennye Boroughs, MD aware.

## 2022-03-20 NOTE — Progress Notes (Signed)
Urology Inpatient Progress Note  Subjective: No acute events overnight.  He is afebrile, VSS. WBC count stable, 7.7.  Hemoglobin up, 8.4.  On antibiotics as below. Repeat CTAP without contrast yesterday revealed stable overall size of his right subcapsular hematoma.  The previously visualized gas collection within the renal parenchyma seems to have moved into the region of the subcapsular hematoma. Today he reports generalized body aches and pain with worsening in his low back.  Anti-infectives: Anti-infectives (From admission, onward)    Start     Dose/Rate Route Frequency Ordered Stop   03/18/22 1400  ceFAZolin (ANCEF) IVPB 1 g/50 mL premix  Status:  Discontinued        1 g 100 mL/hr over 30 Minutes Intravenous Every 8 hours 03/18/22 0739 03/18/22 0739   03/18/22 1400  ceFAZolin (ANCEF) IVPB 2g/100 mL premix        2 g 200 mL/hr over 30 Minutes Intravenous Every 8 hours 03/18/22 0739     03/16/22 1500  levofloxacin (LEVAQUIN) IVPB 750 mg  Status:  Discontinued        750 mg 100 mL/hr over 90 Minutes Intravenous Every 48 hours 03/16/22 1408 03/16/22 1415   03/16/22 1200  cefTRIAXone (ROCEPHIN) 2 g in sodium chloride 0.9 % 100 mL IVPB  Status:  Discontinued        2 g 200 mL/hr over 30 Minutes Intravenous Every 24 hours 03/16/22 0507 03/18/22 0739   03/16/22 0300  ceFEPIme (MAXIPIME) 2 g in sodium chloride 0.9 % 100 mL IVPB  Status:  Discontinued        2 g 200 mL/hr over 30 Minutes Intravenous Every 12 hours 03/15/22 2030 03/16/22 0505   03/15/22 2200  ceFEPIme (MAXIPIME) 1 g in sodium chloride 0.9 % 100 mL IVPB  Status:  Discontinued        1 g 200 mL/hr over 30 Minutes Intravenous Every 8 hours 03/15/22 2011 03/15/22 2029   03/15/22 2200  vancomycin variable dose per unstable renal function (pharmacist dosing)  Status:  Discontinued         Does not apply See admin instructions 03/15/22 2032 03/17/22 1154   03/15/22 1630  vancomycin (VANCOREADY) IVPB 1500 mg/300 mL       See  Hyperspace for full Linked Orders Report.   1,500 mg 150 mL/hr over 120 Minutes Intravenous  Once 03/15/22 1526 03/15/22 2104   03/15/22 1530  vancomycin (VANCOCIN) IVPB 1000 mg/200 mL premix       See Hyperspace for full Linked Orders Report.   1,000 mg 200 mL/hr over 60 Minutes Intravenous  Once 03/15/22 1526 03/15/22 1736   03/15/22 1515  ceFEPIme (MAXIPIME) 2 g in sodium chloride 0.9 % 100 mL IVPB        2 g 200 mL/hr over 30 Minutes Intravenous  Once 03/15/22 1512 03/15/22 1614   03/15/22 1515  vancomycin (VANCOCIN) IVPB 1000 mg/200 mL premix  Status:  Discontinued        1,000 mg 200 mL/hr over 60 Minutes Intravenous  Once 03/15/22 1512 03/15/22 1524       Current Facility-Administered Medications  Medication Dose Route Frequency Provider Last Rate Last Admin   0.9 %  sodium chloride infusion  250 mL Intravenous Continuous Rust-Chester, Huel Cote, NP   Held at 03/16/22 0018   0.9 %  sodium chloride infusion   Intravenous Continuous Bradly Bienenstock, NP   Stopped at 03/18/22 1729   amiodarone (PACERONE) tablet 200 mg  200 mg Oral  Daily Hugelmeyer, Alexis, DO   200 mg at 03/19/22 0813   ascorbic acid (VITAMIN C) tablet 500 mg  500 mg Oral Daily Hugelmeyer, Alexis, DO   500 mg at 03/19/22 0813   budesonide (PULMICORT) nebulizer solution 0.5 mg  0.5 mg Nebulization BID Hugelmeyer, Alexis, DO   0.5 mg at 03/20/22 0749   ceFAZolin (ANCEF) IVPB 2g/100 mL premix  2 g Intravenous Q8H Benita Gutter, RPH 200 mL/hr at 03/20/22 0533 2 g at 03/20/22 0533   Chlorhexidine Gluconate Cloth 2 % PADS 6 each  6 each Topical Daily Hugelmeyer, Alexis, DO   6 each at 03/19/22 4098   cholecalciferol (VITAMIN D3) 25 MCG (1000 UNIT) tablet 1,000 Units  1,000 Units Oral Daily Hugelmeyer, Alexis, DO   1,000 Units at 03/19/22 1191   cyanocobalamin (VITAMIN B12) tablet 1,000 mcg  1,000 mcg Oral Daily Hugelmeyer, Alexis, DO   1,000 mcg at 03/19/22 0812   dextrose 50 % solution 0-50 mL  0-50 mL Intravenous PRN  Hugelmeyer, Alexis, DO       ezetimibe (ZETIA) tablet 10 mg  10 mg Oral Daily Hugelmeyer, Alexis, DO   10 mg at 03/19/22 4782   ferrous sulfate tablet 325 mg  325 mg Oral Q breakfast Hugelmeyer, Alexis, DO   325 mg at 03/19/22 9562   finasteride (PROSCAR) tablet 5 mg  5 mg Oral Daily Hugelmeyer, Alexis, DO   5 mg at 03/19/22 0813   insulin aspart (novoLOG) injection 0-20 Units  0-20 Units Subcutaneous Q4H Rust-Chester, Britton L, NP   4 Units at 03/20/22 0141   insulin detemir (LEVEMIR) injection 15 Units  15 Units Subcutaneous BID Rust-Chester, Huel Cote, NP   15 Units at 03/19/22 2140   levothyroxine (SYNTHROID) tablet 25 mcg  25 mcg Oral Q0600 Hugelmeyer, Alexis, DO   25 mcg at 03/20/22 0530   multivitamin with minerals tablet 1 tablet  1 tablet Oral Daily Hugelmeyer, Alexis, DO   1 tablet at 03/18/22 0955   mupirocin ointment (BACTROBAN) 2 % 1 Application  1 Application Nasal BID Rust-Chester, Huel Cote, NP   1 Application at 13/08/65 2145   pantoprazole (PROTONIX) EC tablet 40 mg  40 mg Oral Daily Benita Gutter, RPH   40 mg at 03/19/22 0813   primidone (MYSOLINE) tablet 250 mg  250 mg Oral BID Hugelmeyer, Alexis, DO   250 mg at 03/19/22 2142   tamsulosin (FLOMAX) capsule 0.4 mg  0.4 mg Oral Daily Hugelmeyer, Alexis, DO   0.4 mg at 03/19/22 0813   traMADol (ULTRAM) tablet 50 mg  50 mg Oral Daily PRN Hugelmeyer, Alexis, DO   50 mg at 03/19/22 0813   Objective: Vital signs in last 24 hours: Temp:  [97.7 F (36.5 C)-99.6 F (37.6 C)] 99.6 F (37.6 C) (11/21 0526) Pulse Rate:  [54-95] 95 (11/21 0526) Resp:  [18-19] 18 (11/21 0526) BP: (118-146)/(60-80) 146/80 (11/21 0526) SpO2:  [97 %-98 %] 97 % (11/21 0749) Weight:  [140.8 kg] 140.8 kg (11/21 0526)  Intake/Output from previous day: 11/20 0701 - 11/21 0700 In: 1080 [P.O.:1080] Out: 7846 [Urine:4175] Intake/Output this shift: No intake/output data recorded.  Physical Exam Vitals and nursing note reviewed.  Constitutional:       General: He is not in acute distress.    Appearance: He is not ill-appearing, toxic-appearing or diaphoretic.  HENT:     Head: Normocephalic and atraumatic.  Pulmonary:     Effort: Pulmonary effort is normal. No respiratory distress.  Skin:  General: Skin is warm and dry.  Neurological:     Mental Status: He is alert and oriented to person, place, and time.  Psychiatric:        Mood and Affect: Mood normal.        Behavior: Behavior normal.    Lab Results:  Recent Labs    03/18/22 0422 03/20/22 0439  WBC 6.8 7.7  HGB 7.9* 8.4*  HCT 23.4* 26.5*  PLT 122* 201   BMET Recent Labs    03/19/22 0439 03/20/22 0439  NA 139 141  K 3.5 3.7  CL 109 109  CO2 26 28  GLUCOSE 156* 110*  BUN 32* 28*  CREATININE 1.43* 1.31*  CALCIUM 8.2* 8.1*   Studies/Results: CT ABDOMEN PELVIS WO CONTRAST  Result Date: 03/19/2022 CLINICAL DATA:  History of pyelonephritis by report also found to have perinephric hematoma EXAM: CT ABDOMEN AND PELVIS WITHOUT CONTRAST TECHNIQUE: Multidetector CT imaging of the abdomen and pelvis was performed following the standard protocol without IV contrast. RADIATION DOSE REDUCTION: This exam was performed according to the departmental dose-optimization program which includes automated exposure control, adjustment of the mA and/or kV according to patient size and/or use of iterative reconstruction technique. COMPARISON:  March 16, 2022 FINDINGS: Lower chest: Moderate RIGHT effusion. Basilar airspace disease is slightly increased in the RIGHT chest as is the pleural fluid. Very small LEFT pleural effusion slightly increased as well. Aortic valvular calcifications, mitral annular calcifications and coronary artery disease similar to previous imaging. Low attenuation in the cardiac blood pool. Hepatobiliary: Smooth hepatic contours. No pericholecystic stranding. No gross biliary duct distension. No visible lesion on noncontrast imaging. Pancreas: Pancreas with normal  contours, no signs of inflammation or peripancreatic fluid. Spleen: Normal. Adrenals/Urinary Tract: Adrenal glands are normal. RIGHT kidney: Large perinephric and subcapsular hematoma with compression of renal parenchyma. At the level of the interpolar RIGHT kidney this measures approximately 8 x 4.2 cm previously 8.3 x 4.3 cm. Hematoma extends into the perinephric space. Density of this hematoma is similar to previous imaging. The amount of hematoma in the inferior perinephric and posterior pararenal space is perhaps very slightly diminished. No nephrolithiasis. No ureteral calculi or obstruction on the RIGHT. Gas along the RIGHT renal parenchyma and or collecting system elements of the lower and interpolar RIGHT kidney have diminished in terms of components extending into are involving potentially the renal parenchyma. Gas tracks inferiorly into the hematoma immediately subjacent to the RIGHT kidney. In the coronal plane gas measures 5.7 x 2.4 cm along the lower margin of the subcapsular portion of the hematoma previously 5.5 x 3.0 cm. Smaller amounts of gas are seen in the perinephric aspect of the hematoma that were not present previously,, for instance on image 61/5 where there is a 1.6 x 2 cm area of gas within this inferior perinephric hematoma and small amounts of gas tracking from the subcapsular hematoma into the perinephric hematoma. Perinephric hematoma as measured in the coronal plane (image 61/5 6.1 x 2.8 cm as compared to 7.5 x 3.1 cm. LEFT kidney: No signs of hematoma, nephrolithiasis or hydronephrosis on the LEFT. Renal cortical scarring on the LEFT. Perinephric stranding on the LEFT. Stomach/Bowel: No acute gastrointestinal findings. Vascular/Lymphatic: Aortic atherosclerosis. No sign of aneurysm. Smooth contour of the IVC. There is no gastrohepatic or hepatoduodenal ligament lymphadenopathy. No retroperitoneal or mesenteric lymphadenopathy. No pelvic sidewall lymphadenopathy. Atherosclerotic  changes are moderate and calcified. Reproductive: Unremarkable by CT. Other: Body wall edema. Stranding throughout the RIGHT retroperitoneum. Musculoskeletal: Post  sternotomy. Spinal degenerative changes. No acute or destructive bone process. IMPRESSION: 1. Persistent large subcapsular hematoma with compression of renal parenchyma not substantially changed in size though potentially with slight decreased size of hematoma in the perinephric space and pararenal space below the RIGHT kidney. 2. Diminished gas within RIGHT renal parenchyma and or collecting system elements of the RIGHT kidney but with propagation of gas into the perinephric/inferior pararenal extension of the hematoma. Findings may reflect partial decompression of gas collection in the subcapsular space on the previous study though may show very slight increase overall since previous imaging. While the amount of parenchymal or collecting system involvement currently appears less, findings remain consistent with gas-forming infection and could be a result of emphysematous pyelonephritis. Development of gas outside of the renal capsule may herald early retroperitoneal extension of this process. 3. Moderate RIGHT effusion slightly increased in the RIGHT chest as is the pleural fluid. Very small LEFT pleural effusion slightly increased as well. Findings could be the result of reactive effusion in the RIGHT chest. Correlate with any signs of respiratory. Infection 4. Low attenuation in the cardiac blood pool. This can be seen in the setting of anemia. 5. Aortic atherosclerosis and coronary artery disease. Aortic Atherosclerosis (ICD10-I70.0). These results will be called to the ordering clinician or representative by the Radiologist Assistant, and communication documented in the PACS or [employer]. Electronically Signed   By: Zetta Bills M.D.   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  He is clinically improving today with no evidence of active bleeding.  His hematoma appears stable in size, but there is been interval migration of the gas from the renal parenchyma into the hematoma.  We continue to recommend against percutaneous drainage due to concerns for possible increased bleeding.  That said, he will be at risk for abscess formation.  We recommend outpatient follow-up with [place] in 2 weeks with CT AP with contrast prior, would recommend repeat imaging sooner if he clinically declines in the interim.  We will arrange outpatient follow-up.  Debroah Loop, PA-C 03/20/2022

## 2022-03-20 NOTE — TOC Progression Note (Addendum)
Transition of Care (TOC) - Progression Note    Patient Details  Name: [name]. MRN: [id number] Date of Birth: 10/05/1955  Transition of Care [facility]) CM/SW Contact  Laurena Slimmer, RN Phone Number: 03/20/2022, 12:32 PM  Clinical Narrative:    Attempt to contact patient's wife regarding therpay recommendations. No answer. Left a message with RNCM contact information.   3:57pm Per TOC note from [place] [place] SW 10/17, patient has used all SNF days and wife is aware. Patient is currently active with [facility].         Expected Discharge Plan and Services                                                 Social Determinants of Health (SDOH) Interventions    Readmission Risk Interventions    02/13/2022    4:57 PM 09/29/2021   11:12 AM  Readmission Risk Prevention Plan  Transportation Screening Complete Complete  PCP or Specialist Appt within 3-5 Days  Complete  HRI or [place]  Complete  Social Work Consult for [place] Planning/Counseling  Complete  Palliative Care Screening  Not Applicable  Medication Review [employer] photographer) Complete Complete  PCP or Specialist appointment within 3-5 days of discharge Complete   HRI or [place] Complete   SW Recovery Care/Counseling Consult Complete   [place] Not Applicable

## 2022-03-20 NOTE — Inpatient Diabetes Management (Signed)
Inpatient Diabetes Program Recommendations  AACE/ADA: New Consensus Statement on Inpatient Glycemic Control   Target Ranges:  Prepandial:   less than 140 mg/dL      Peak postprandial:   less than 180 mg/dL (1-2 hours)      Critically ill patients:  140 - 180 mg/dL    Latest Reference Range & Units 03/20/22 01:26 03/20/22 05:28  Glucose-Capillary 70 - 99 mg/dL 157 (H) 94    Latest Reference Range & Units 03/19/22 07:57 03/19/22 11:30 03/19/22 16:17 03/19/22 20:11 03/19/22 23:49  Glucose-Capillary 70 - 99 mg/dL 140 (H) 180 (H) 219 (H) 255 (H) 183 (H)   Review of Glycemic Control  Diabetes history: DM2 Outpatient Diabetes medications: Semglee 0-25 QHS, Novolog 0-20 units TID (not taking), Humalog 15 units TID and Metformin 1000 mg BID (not taking)  Current orders for Inpatient glycemic control: Levemir 15 units BID, Novolog 0-20 units Q4H   Inpatient Diabetes Program Recommendations:     Insulin: Per chart, patient is eating 60-100% of meals. Please consider changing CBGs and Novolog 0-20 units to AC&HS and ordering Novolog 3 units TID with meals for meal coverage if patient eats at least 50% of meals.   Thanks, Barnie Alderman, RN, MSN, [place] Diabetes Coordinator Inpatient Diabetes Program [phone] (Team Pager from 8am to [place])

## 2022-03-21 ENCOUNTER — Encounter (INDEPENDENT_AMBULATORY_CARE_PROVIDER_SITE_OTHER): Payer: Medicare Other

## 2022-03-21 ENCOUNTER — Ambulatory Visit (INDEPENDENT_AMBULATORY_CARE_PROVIDER_SITE_OTHER): Payer: Medicare Other | Admitting: Nurse Practitioner

## 2022-03-21 ENCOUNTER — Encounter: Payer: Self-pay | Admitting: Family Medicine

## 2022-03-21 DIAGNOSIS — A419 Sepsis, unspecified organism: Secondary | ICD-10-CM | POA: Diagnosis not present

## 2022-03-21 DIAGNOSIS — R6521 Severe sepsis with septic shock: Secondary | ICD-10-CM | POA: Diagnosis not present

## 2022-03-21 LAB — BASIC METABOLIC PANEL
Anion gap: 4 — ABNORMAL LOW (ref 5–15)
BUN: 27 mg/dL — ABNORMAL HIGH (ref 8–23)
CO2: 27 mmol/L (ref 22–32)
Calcium: 8.2 mg/dL — ABNORMAL LOW (ref 8.9–10.3)
Chloride: 106 mmol/L (ref 98–111)
Creatinine, Ser: 1.24 mg/dL (ref 0.61–1.24)
GFR, Estimated: >60 mL/min (ref >60)
Glucose, Bld: 190 mg/dL — ABNORMAL HIGH (ref 70–99)
Potassium: 3.7 mmol/L (ref 3.5–5.1)
Sodium: 137 mmol/L (ref 135–145)

## 2022-03-21 LAB — GLUCOSE, CAPILLARY
Glucose-Capillary: 137 mg/dL — ABNORMAL HIGH (ref 70–99)
Glucose-Capillary: 138 mg/dL — ABNORMAL HIGH (ref 70–99)
Glucose-Capillary: 146 mg/dL — ABNORMAL HIGH (ref 70–99)
Glucose-Capillary: 183 mg/dL — ABNORMAL HIGH (ref 70–99)
Glucose-Capillary: 183 mg/dL — ABNORMAL HIGH (ref 70–99)
Glucose-Capillary: 184 mg/dL — ABNORMAL HIGH (ref 70–99)

## 2022-03-21 NOTE — Care Management Important Message (Signed)
Important Message  Patient Details  Name: [name]. MRN: [id number] Date of Birth: 31-Mar-1956   Medicare Important Message Given:  Yes  Patient asleep upon time of visit, no family in room.  Copy of Medicare IM left on bedside tray for reference.    [name] 03/21/2022, 10:34 AM

## 2022-03-21 NOTE — Progress Notes (Addendum)
PT Cancellation Note  Patient Details Name: [name]. MRN: [id number] DOB: [birth year]   Cancelled Treatment:    Reason Eval/Treat Not Completed: Fatigue/lethargy limiting ability to participate. Patient sleeping soundly on arrival. Will re-attempt later as time allows. Returned at 11:30 patient still sleeping and declines PT at this time. Will check back later as time allows.    Marketa Midkiff 03/21/2022, 10:08 AM

## 2022-03-21 NOTE — Progress Notes (Signed)
[name] at [place] NAME: [name]    MR#:  [id number]  DATE OF BIRTH:  [birth year]  SUBJECTIVE:  came in with septic shock due to UTI and was found to have [name] afraid hematoma. Patient overall doing well. Denies any abdominal pain. Eating somewhat better. Sugars stable. No family at bedside earlier during my evaluation. Patient lives at home with wife.    VITALS:  Blood pressure (!) 112/59, pulse 88, temperature 97.8 F (36.6 C), resp. rate 18, height '6\' 4"'$  (1.93 m), weight (!) 140.8 kg, SpO2 100 %.  PHYSICAL EXAMINATION:   GENERAL:  66 y.o.-year-old patient lying in the bed with no acute distress. Chronically ill LUNGS: Normal breath sounds bilaterally, no wheezing CARDIOVASCULAR: S1, S2 normal. No murmurs,   ABDOMEN: Soft, nontender, nondistended. Bowel sounds present.  EXTREMITIES: amputation stump dressing present NEUROLOGIC: nonfocal  patient is alert and awake SKIN: No obvious rash, lesion, or ulcer.   LABORATORY PANEL:  CBC Recent Labs  Lab 03/20/22 0439  WBC 7.7  HGB 8.4*  HCT 26.5*  PLT 201    Chemistries  Recent Labs  Lab 03/19/22 0439 03/20/22 0439 03/21/22 0338  NA 139 141 137  K 3.5 3.7 3.7  CL 109 109 106  CO2 '26 28 27  '$ GLUCOSE 156* 110* 190*  BUN 32* 28* 27*  CREATININE 1.43* 1.31* 1.24  CALCIUM 8.2* 8.1* 8.2*  MG 1.9  --   --   AST  --  26  --   ALT  --  23  --   ALKPHOS  --  159*  --   BILITOT  --  0.5  --    Cardiac Enzymes No results for input(s): "TROPONINI" in the last 168 hours. RADIOLOGY:  US THORACENTESIS ASP PLEURAL SPACE W/IMG GUIDE  Result Date: 03/20/2022 INDICATION: Right pleural effusion EXAM: ULTRASOUND GUIDED RIGHT THORACENTESIS MEDICATIONS: 20 mL 1% lidocaine COMPLICATIONS: None immediate. PROCEDURE: An ultrasound guided thoracentesis was thoroughly discussed with the patient and questions answered. The benefits, risks, alternatives and complications were also  discussed. The patient understands and wishes to proceed with the procedure. Written consent was obtained. Ultrasound was performed to localize and mark an adequate pocket of fluid in the right chest. The area was then prepped and draped in the normal sterile fashion. 1% Lidocaine was used for local anesthesia. An unsuccessful thoracentesis attempt was made by Reatha Armour, PA-C using a 6 French Safe-T-Centesis. Dr. Markus Wood was called to the room, and under ultrasound guidance a 19 gauge, 10-cm, Yueh catheter was introduced. Thoracentesis was performed. The catheter was removed and a dressing applied. FINDINGS: A total of approximately 300 cc of red fluid was removed. Samples were sent to the laboratory as requested by the clinical team. IMPRESSION: Successful ultrasound guided right thoracentesis yielding 300 cc of pleural fluid. Follow-up chest x-ray revealed no evidence of pneumothorax. Read by: Reatha Armour, PA-C Electronically Signed   By: [name] M.D.   On: 03/20/2022 16:27   DG Chest Port 1 View  Result Date: 03/20/2022 CLINICAL DATA:  Right pleural effusion post thoracentesis EXAM: PORTABLE CHEST 1 VIEW COMPARISON:  Chest 03/16/2022 FINDINGS: Postop CABG. Mild cardiac enlargement. Pulmonary vascular congestion with progression from the prior study. No significant pleural effusion identified. No pneumothorax. Left jugular central venous catheter tip in the left innominate vein which has pulled back slightly from the prior study. IMPRESSION: 1. Negative for pneumothorax following right thoracentesis. 2. Pulmonary edema with progression  from the prior study Electronically Signed   By: Franchot Gallo M.D.   On: 03/20/2022 15:55    Assessment and Plan [name]. is a 66 y.o. male with medical history significant for asthma, COPD, CHF, diabetes, history of left BKA, BPH, hypertension, dyslipidemia, hypothyroidism, recent hospitalization for fall and atrial fibrillation with RVR in October  2023, s/p cardioversion on 02/13/2022.  He presented to the hospital because of confusion and hyperglycemia.  His wife said he had tried to use the lift to get patient into the bed the night prior to admission but was unsuccessful and patient ended up on the floor.   CT abdomen and pelvis showed large subcapsular hematoma of the right kidney and large amount of gas concerning for emphysematous pyelonephritis.   He was admitted to the hospital for septic shock secondary to acute UTI.  He was treated with IV fluids and empiric IV antibiotics.  He also required Levophed for septic shock.  He was also treated with IV insulin drip for hyperosmolar hyperglycemic state.  He had AKI and hyponatremia that improved with IV fluids.  Severe sepsis with septic shock secondary to acute UTI/emphysematous perinephritis Klebsiella sepsis -- patient was admitted in the ICU. He was on IV vasopressin is now discontinued -- currently on IV cefazolin-- will change to PO Keflex total seven days -- patient is afebrile. White count normal -- patient was seen by urology Dr. Erlene Quan recommends against percutaneous drainage. Follow-up as outpatient  Moderate right pleural effusion -- status post thoracentesis-- 300 mL fluid removed  Chronic hypoxic respiratory failure on chronic home oxygen -- stable  Type II diabetes with hyperglycemia -- continue Levemir  Paroxysmal atrial flutter and fibrillation status post cardioversion February 13, 2022 -- continue amiodarone -- hold eliquis for now given large perinephric hematoma per Dr. Erlene Quan. Patient is to follow-up in two weeks and decision regarding eliquis resumption will be based on follow-up visit.  acute renal failure in the setting of septic shock resolved    Procedures: Family communication : wife Consults : urology CODE STATUS: full DVT Prophylaxis : SCD Level of care: Progressive Status is: Inpatient Remains inpatient appropriate because: monitor  hemoglobin     TOTAL TIME TAKING CARE OF THIS PATIENT: 35 minutes.  >50% time spent on counselling and coordination of care  Note: This dictation was prepared with Dragon dictation along with smaller phrase technology. Any transcriptional errors that result from this process are unintentional.  Fritzi Mandes M.D    [facility]   CC: Primary care physician; [name], MD

## 2022-03-21 NOTE — Progress Notes (Signed)
PT Cancellation Note  Patient Details Name: [name]. MRN: [id number] DOB: 03/20/1956   Cancelled Treatment:    Reason Eval/Treat Not Completed: Patient declined, no reason specified;Fatigue/lethargy limiting ability to participate. Reports he did not sleep last night. Will re-attempt later date.   Rosalita Carey 03/21/2022, 3:07 PM

## 2022-03-21 NOTE — Inpatient Diabetes Management (Signed)
Inpatient Diabetes Program Recommendations  AACE/ADA: New Consensus Statement on Inpatient Glycemic Control   Target Ranges:  Prepandial:   less than 140 mg/dL      Peak postprandial:   less than 180 mg/dL (1-2 hours)      Critically ill patients:  140 - 180 mg/dL    Latest Reference Range & Units 03/21/22 03:38  Glucose 70 - 99 mg/dL 190 (H)    Latest Reference Range & Units 03/20/22 05:28 03/20/22 09:01 03/20/22 12:11 03/20/22 16:35 03/20/22 21:04 03/21/22 00:02 03/21/22 03:51  Glucose-Capillary 70 - 99 mg/dL 94 93    Levemir 15 units 154 (H)  Novolog 4 units 193 (H)  Novolog 4 units 158 (H)  Novolog 4 units  Levemir 15 units 183 (H)  Novolog 4 units 184 (H)  Novolog 4 units   Review of Glycemic Control  Diabetes history: DM2 Outpatient Diabetes medications: Semglee 0-25 QHS, Novolog 0-20 units TID (not taking), Humalog 15 units TID and Metformin 1000 mg BID (not taking)  Current orders for Inpatient glycemic control: Levemir 15 units BID, Novolog 0-20 units Q4H   Inpatient Diabetes Program Recommendations:     Insulin: If patient is eating well, please consider changing CBGs and Novolog 0-20 units to AC&HS and ordering Novolog 3 units TID with meals for meal coverage if patient eats at least 50% of meals.  Thanks, Barnie Alderman, RN, MSN, [place] Diabetes Coordinator Inpatient Diabetes Program [phone] (Team Pager from 8am to [place])

## 2022-03-21 NOTE — TOC Initial Note (Signed)
Transition of Care (TOC) - Initial/Assessment Note    Patient Details  Name: [name]. MRN: [id number] Date of Birth: 1955-07-28  Transition of Care [facility]) CM/SW Contact:    Tiburcio Bash, LCSW Phone Number: 03/21/2022, 10:46 AM  Clinical Narrative:                  CSW met with patient at bedside, he is aware he has used all covered snf days and is in agreement to going home at time of discharge and continuing home health services through [facility]. Reports his wife is also at home.   Expected Discharge Plan: [place] Barriers to Discharge: Continued Medical Work up   Patient Goals and CMS Choice Patient states their goals for this hospitalization and ongoing recovery are:: to go home CMS Medicare.gov Compare Post Acute Care list provided to:: Patient Choice offered to / list presented to : Patient  Expected Discharge Plan and Services Expected Discharge Plan: [place]                                              Prior Living Arrangements/Services                       Activities of Daily Living      Permission Sought/Granted                  Emotional Assessment              Admission diagnosis:  Hyperglycemia [R73.9] Sepsis secondary to UTI ([place]) [A41.9, N39.0] Urinary tract infection without hematuria, site unspecified [N39.0] Sepsis, due to unspecified organism, unspecified whether acute organ dysfunction present [facility]) [A41.9] Patient Active Problem List   Diagnosis Date Noted   Perinephric hematoma 03/20/2022   Bacteremia due to Klebsiella pneumoniae 03/19/2022   Hyperglycemia 03/16/2022   Septic shock ([place]) 03/16/2022   Sepsis secondary to UTI ([place]) 03/15/2022   Acute hypoxemic respiratory failure ([place]) 02/13/2022   Atrial fibrillation with rapid ventricular response ([place]) 02/11/2022   Uncontrolled type 2 diabetes mellitus with hyperglycemia, with long-term current use of  insulin ([place]) 02/11/2022   Dyslipidemia 02/11/2022   Hypothyroidism 02/11/2022   Benign prostatic hyperplasia with lower urinary tract symptoms 02/11/2022   Pressure injury of skin of heel 11/04/2021   UTI (urinary tract infection) 10/22/2021   Iron deficiency anemia 10/19/2021   Hyperkalemia 10/14/2021   Acute urinary retention 10/08/2021   Sepsis due to methicillin resistant Staphylococcus aureus (MRSA) ([place]) 10/04/2021   Acute delirium 10/04/2021   Acute on chronic respiratory failure with hypoxia and hypercapnia ([place]) 10/04/2021   Acute respiratory failure with hypoxia and hypercapnia (HCC) 10/03/2021   OSA (obstructive sleep apnea) 10/03/2021   Malnutrition of moderate degree 09/29/2021   Generalized weakness 09/29/2021   Cellulitis of left foot 08/04/2021   Uncontrolled type 2 diabetes mellitus with hypoglycemia, with long-term current use of insulin ([place]) 08/04/2021   Bilateral lower extremity edema 08/04/2021   Acute osteomyelitis of lower leg, left ([place]) 08/04/2021   Swelling of limb 07/18/2021   CHF exacerbation ([place]) 05/08/2021   Diabetes mellitus without complication (HCC)    Atrial fibrillation with RVR (HCC)    Obesity, Class III, BMI 40-49.9 (morbid obesity) ([place])    Venous stasis ulcer ([employer])    Sinus tachycardia  BPH (benign prostatic hyperplasia)    Chronic anticoagulation    Anasarca    Acute on chronic diastolic CHF (congestive heart failure) (HCC)    Venous stasis of both lower extremities    AF (paroxysmal atrial fibrillation) ([place])    Lower extremity cellulitis 01/03/2021   Chronic osteomyelitis of left foot ([place]) 01/03/2021   Osteomyelitis ([place]) 01/03/2021   Severe sepsis ([place]) 02/24/2020   AKI (acute kidney injury) ([place]) 02/24/2020   Bilateral cellulitis of lower leg 02/23/2020   CAD (coronary artery disease) 02/02/2020   RLS (restless legs syndrome) 05/15/2019   Statin myopathy 05/15/2019   Diabetes mellitus type 2 in obese ([place]) 02/13/2016    Essential hypertension 02/13/2016   PAD (peripheral artery disease) ([place]) 02/13/2016   Pressure injury of skin 01/25/2016   Hypoglycemia associated with diabetes ([place]) 12/27/2013   Long-term insulin use ([place]) 12/27/2013   Microalbuminuria 12/27/2013   B12 deficiency 09/29/2013   Obesity 09/29/2013   CAD (coronary artery disease), autologous vein bypass graft 07/20/2013   Hypersomnia with sleep apnea 07/20/2013   Pure hypercholesterolemia 07/20/2013   Osteomyelitis of ankle or foot 07/20/2011   PCP:  [name], MD Pharmacy:   [place] [place] [address] [place] [address] Phone: [phone] Fax: [place], [place] [address] Phone: [phone] Fax: [phone]  [facility] - [facility] [place], [place] [address] [place] [address] Phone: [phone] Fax: [phone]     Social Determinants of Health (SDOH) Interventions    Readmission Risk Interventions    02/13/2022    4:57 PM 09/29/2021   11:12 AM  Readmission Risk Prevention Plan  Transportation Screening Complete Complete  PCP or Specialist Appt within 3-5 Days  Complete  HRI or [place]  Complete  Social Work Consult for [place] Planning/Counseling  Complete  Palliative Care Screening  Not Applicable  Medication Review Press photographer) Complete Complete  PCP or Specialist appointment within 3-5 days of discharge Complete   HRI or [place] Complete   SW Recovery Care/Counseling Consult Complete   [place] Not Applicable

## 2022-03-22 DIAGNOSIS — R6521 Severe sepsis with septic shock: Secondary | ICD-10-CM | POA: Diagnosis not present

## 2022-03-22 DIAGNOSIS — A419 Sepsis, unspecified organism: Secondary | ICD-10-CM | POA: Diagnosis not present

## 2022-03-22 LAB — GLUCOSE, CAPILLARY
Glucose-Capillary: 205 mg/dL — ABNORMAL HIGH (ref 70–99)
Glucose-Capillary: 199 mg/dL — ABNORMAL HIGH (ref 70–99)
Glucose-Capillary: 132 mg/dL — ABNORMAL HIGH (ref 70–99)
Glucose-Capillary: 82 mg/dL (ref 70–99)
Glucose-Capillary: 188 mg/dL — ABNORMAL HIGH (ref 70–99)
Glucose-Capillary: 252 mg/dL — ABNORMAL HIGH (ref 70–99)

## 2022-03-22 MED ORDER — DILTIAZEM HCL ER COATED BEADS 120 MG PO CP24
120.0000 mg | ORAL_CAPSULE | Freq: Every day | ORAL | Status: DC
  Administered 2022-03-22 – 2022-03-23 (×2): 120 mg via ORAL
  Filled 2022-03-22 (×2): qty 1

## 2022-03-22 NOTE — Progress Notes (Signed)
[place] [facility] [place] NAME: [name]    MR#:  [id number]  DATE OF BIRTH:  01/04/1956  SUBJECTIVE:  came in with septic shock due to UTI and was found to have Perinephric hematoma. Patient overall doing well. Denies any abdominal pain. Eating somewhat better. Sugars stable. No family at bedside earlier during my evaluation. Patient lives at home with wife. Pt reports not sleeping too well last nite    VITALS:  Blood pressure 127/65, pulse 90, temperature 98.7 F (37.1 C), resp. rate 17, height '6\' 4"'$  (1.93 m), weight (!) 140.8 kg, SpO2 96 %.  PHYSICAL EXAMINATION:   GENERAL:  66 y.o.-year-old patient lying in the bed with no acute distress. Chronically ill LUNGS: Normal breath sounds bilaterally, no wheezing CARDIOVASCULAR: S1, S2 normal. No murmurs,   ABDOMEN: Soft, nontender, nondistended. Bowel sounds present.  EXTREMITIES: amputation stump dressing present NEUROLOGIC: nonfocal  patient is alert and awake SKIN: No obvious rash, lesion, or ulcer.   LABORATORY PANEL:  CBC Recent Labs  Lab 03/20/22 0439  WBC 7.7  HGB 8.4*  HCT 26.5*  PLT 201     Chemistries  Recent Labs  Lab 03/19/22 0439 03/20/22 0439 03/21/22 0338  NA 139 141 137  K 3.5 3.7 3.7  CL 109 109 106  CO2 '26 28 27  '$ GLUCOSE 156* 110* 190*  BUN 32* 28* 27*  CREATININE 1.43* 1.31* 1.24  CALCIUM 8.2* 8.1* 8.2*  MG 1.9  --   --   AST  --  26  --   ALT  --  23  --   ALKPHOS  --  159*  --   BILITOT  --  0.5  --     Cardiac Enzymes No results for input(s): "TROPONINI" in the last 168 hours. RADIOLOGY:  US THORACENTESIS ASP PLEURAL SPACE W/IMG GUIDE  Result Date: 03/20/2022 INDICATION: Right pleural effusion EXAM: ULTRASOUND GUIDED RIGHT THORACENTESIS MEDICATIONS: 20 mL 1% lidocaine COMPLICATIONS: None immediate. PROCEDURE: An ultrasound guided thoracentesis was thoroughly discussed with the patient and questions answered. The benefits, risks,  alternatives and complications were also discussed. The patient understands and wishes to proceed with the procedure. Written consent was obtained. Ultrasound was performed to localize and mark an adequate pocket of fluid in the right chest. The area was then prepped and draped in the normal sterile fashion. 1% Lidocaine was used for local anesthesia. An unsuccessful thoracentesis attempt was made by Reatha Armour, PA-C using a 6 French Safe-T-Centesis. Dr. Markus Daft was called to the room, and under ultrasound guidance a 19 gauge, 10-cm, Yueh catheter was introduced. Thoracentesis was performed. The catheter was removed and a dressing applied. FINDINGS: A total of approximately 300 cc of red fluid was removed. Samples were sent to the laboratory as requested by the clinical team. IMPRESSION: Successful ultrasound guided right thoracentesis yielding 300 cc of pleural fluid. Follow-up chest x-ray revealed no evidence of pneumothorax. Read by: Reatha Armour, PA-C Electronically Signed   By: Markus Daft M.D.   On: 03/20/2022 16:27   DG Chest Port 1 View  Result Date: 03/20/2022 CLINICAL DATA:  Right pleural effusion post thoracentesis EXAM: PORTABLE CHEST 1 VIEW COMPARISON:  Chest 03/16/2022 FINDINGS: Postop CABG. Mild cardiac enlargement. Pulmonary vascular congestion with progression from the prior study. No significant pleural effusion identified. No pneumothorax. Left jugular central venous catheter tip in the left innominate vein which has pulled back slightly from the prior study. IMPRESSION: 1. Negative for pneumothorax  following right thoracentesis. 2. Pulmonary edema with progression from the prior study Electronically Signed   By: Franchot Gallo M.D.   On: 03/20/2022 15:55    Assessment and Plan [name]. is a 66 y.o. male with medical history significant for asthma, COPD, CHF, diabetes, history of left BKA, BPH, hypertension, dyslipidemia, hypothyroidism, recent hospitalization for fall  and atrial fibrillation with RVR in October 2023, s/p cardioversion on 02/13/2022.  He presented to the hospital because of confusion and hyperglycemia.  His wife said he had tried to use the lift to get patient into the bed the night prior to admission but was unsuccessful and patient ended up on the floor.   CT abdomen and pelvis showed large subcapsular hematoma of the right kidney and large amount of gas concerning for emphysematous pyelonephritis.   He was admitted to the hospital for septic shock secondary to acute UTI.  He was treated with IV fluids and empiric IV antibiotics.  He also required Levophed for septic shock.  He was also treated with IV insulin drip for hyperosmolar hyperglycemic state.  He had AKI and hyponatremia that improved with IV fluids.  Severe sepsis with septic shock secondary to acute UTI/emphysematous perinephritis Klebsiella sepsis -- patient was admitted in the ICU. He was on IV vasopressin is now discontinued -- currently on IV cefazolin--total 7 days d/w pharmacy -- patient is afebrile. White count normal -- patient was seen by urology Dr. Erlene Quan recommends against percutaneous drainage. Follow-up as outpatient  Moderate right pleural effusion -- status post thoracentesis-- 300 mL fluid removed  Chronic hypoxic respiratory failure on chronic home oxygen -- stable  Type II diabetes with hyperglycemia -- continue Levemir  Paroxysmal atrial flutter and fibrillation status post cardioversion February 13, 2022 -- continue amiodarone -- hold eliquis for now given large perinephric hematoma per Dr. Erlene Quan. Patient is to follow-up in two weeks and decision regarding eliquis resumption will be based on follow-up visit.  acute renal failure in the setting of septic shock  --resolved    Procedures: Family communication : wife on the phone Consults : urology CODE STATUS: full DVT Prophylaxis : SCD Level of care: Progressive Status is: Inpatient Remains  inpatient appropriate because: monitor hemoglobin    If remains stable d/c home tomorrow. Wife aware pt will d/c in am. TOC informed of dc plans  TOTAL TIME TAKING CARE OF THIS PATIENT: 35 minutes.  >50% time spent on counselling and coordination of care  Note: This dictation was prepared with Dragon dictation along with smaller phrase technology. Any transcriptional errors that result from this process are unintentional.  Fritzi Mandes M.D    [facility]   CC: Primary care physician; [name], MD

## 2022-03-22 NOTE — TOC Progression Note (Signed)
Transition of Care (TOC) - Progression Note    Patient Details  Name: [name]. MRN: [id number] Date of Birth: 1955-05-16  Transition of Care [facility]) CM/SW Contact  Shelbie Hutching, RN Phone Number: 03/22/2022, 11:04 AM  Clinical Narrative:     Plan for discharge tomorrow with home health services through [facility].  Notified Merleen Nicely that patient will discharge home tomorrow.  Patient will need EMS for transport.   Expected Discharge Plan: [place] Barriers to Discharge: Continued Medical Work up  Expected Discharge Plan and Services Expected Discharge Plan: [place]   Discharge Planning Services: CM Consult Post Acute Care Choice: [place] arrangements for the past 2 months: Single Family Home                           HH Arranged: RN, PT, OT, Nurse's Aide [place] Agency: [facility] Date [place]: 03/22/22 Time [place]: 8675 Representative spoke with at [place]: [place] ([place]) Interventions    Readmission Risk Interventions    02/13/2022    4:57 PM 09/29/2021   11:12 AM  Readmission Risk Prevention Plan  Transportation Screening Complete Complete  PCP or Specialist Appt within 3-5 Days  Complete  HRI or [place]  Complete  Social Work Consult for [place] Planning/Counseling  Complete  Palliative Care Screening  Not Applicable  Medication Review Press photographer) Complete Complete  PCP or Specialist appointment within 3-5 days of discharge Complete   HRI or [place] Complete   SW Recovery Care/Counseling Consult Complete   [place] Not Applicable

## 2022-03-23 DIAGNOSIS — R6521 Severe sepsis with septic shock: Secondary | ICD-10-CM | POA: Diagnosis not present

## 2022-03-23 DIAGNOSIS — A419 Sepsis, unspecified organism: Secondary | ICD-10-CM | POA: Diagnosis not present

## 2022-03-23 LAB — GLUCOSE, CAPILLARY
Glucose-Capillary: 111 mg/dL — ABNORMAL HIGH (ref 70–99)
Glucose-Capillary: 142 mg/dL — ABNORMAL HIGH (ref 70–99)
Glucose-Capillary: 169 mg/dL — ABNORMAL HIGH (ref 70–99)
Glucose-Capillary: 241 mg/dL — ABNORMAL HIGH (ref 70–99)

## 2022-03-23 LAB — CYTOLOGY - NON PAP

## 2022-03-23 NOTE — Plan of Care (Signed)
  Problem: Education: Goal: Ability to describe self-care measures that may prevent or decrease complications (Diabetes Survival Skills Education) will improve Outcome: Progressing Goal: Individualized Educational Video(s) Outcome: Progressing   Problem: Coping: Goal: Ability to adjust to condition or change in health will improve Outcome: Progressing   Problem: Fluid Volume: Goal: Ability to maintain a balanced intake and output will improve Outcome: Progressing   Problem: Health Behavior/Discharge Planning: Goal: Ability to identify and utilize available resources and services will improve Outcome: Progressing Goal: Ability to manage health-related needs will improve Outcome: Progressing   Problem: Metabolic: Goal: Ability to maintain appropriate glucose levels will improve Outcome: Progressing   Problem: Nutritional: Goal: Maintenance of adequate nutrition will improve Outcome: Progressing Goal: Progress toward achieving an optimal weight will improve Outcome: Progressing   Problem: Skin Integrity: Goal: Risk for impaired skin integrity will decrease Outcome: Progressing   Problem: Tissue Perfusion: Goal: Adequacy of tissue perfusion will improve Outcome: Progressing   Problem: Fluid Volume: Goal: Hemodynamic stability will improve Outcome: Progressing   Problem: Clinical Measurements: Goal: Diagnostic test results will improve Outcome: Progressing Goal: Signs and symptoms of infection will decrease Outcome: Progressing   Problem: Respiratory: Goal: Ability to maintain adequate ventilation will improve Outcome: Progressing   Problem: Education: Goal: Ability to describe self-care measures that may prevent or decrease complications (Diabetes Survival Skills Education) will improve Outcome: Progressing Goal: Individualized Educational Video(s) Outcome: Progressing   Problem: Cardiac: Goal: Ability to maintain an adequate cardiac output will  improve Outcome: Progressing   Problem: Health Behavior/Discharge Planning: Goal: Ability to identify and utilize available resources and services will improve Outcome: Progressing Goal: Ability to manage health-related needs will improve Outcome: Progressing   Problem: Fluid Volume: Goal: Ability to achieve a balanced intake and output will improve Outcome: Progressing   Problem: Metabolic: Goal: Ability to maintain appropriate glucose levels will improve Outcome: Progressing   Problem: Nutritional: Goal: Maintenance of adequate nutrition will improve Outcome: Progressing Goal: Maintenance of adequate weight for body size and type will improve Outcome: Progressing   Problem: Respiratory: Goal: Will regain and/or maintain adequate ventilation Outcome: Progressing   Problem: Urinary Elimination: Goal: Ability to achieve and maintain adequate renal perfusion and functioning will improve Outcome: Progressing   Problem: Education: Goal: Knowledge of General Education information will improve Description: Including pain rating scale, medication(s)/side effects and non-pharmacologic comfort measures Outcome: Progressing   Problem: Health Behavior/Discharge Planning: Goal: Ability to manage health-related needs will improve Outcome: Progressing   Problem: Clinical Measurements: Goal: Ability to maintain clinical measurements within normal limits will improve Outcome: Progressing Goal: Will remain free from infection Outcome: Progressing Goal: Diagnostic test results will improve Outcome: Progressing Goal: Respiratory complications will improve Outcome: Progressing Goal: Cardiovascular complication will be avoided Outcome: Progressing   Problem: Activity: Goal: Risk for activity intolerance will decrease Outcome: Progressing   Problem: Nutrition: Goal: Adequate nutrition will be maintained Outcome: Progressing   Problem: Coping: Goal: Level of anxiety will  decrease Outcome: Progressing   Problem: Elimination: Goal: Will not experience complications related to bowel motility Outcome: Progressing Goal: Will not experience complications related to urinary retention Outcome: Progressing   Problem: Pain Managment: Goal: General experience of comfort will improve Outcome: Progressing   Problem: Safety: Goal: Ability to remain free from injury will improve Outcome: Progressing   Problem: Skin Integrity: Goal: Risk for impaired skin integrity will decrease Outcome: Progressing   

## 2022-03-23 NOTE — TOC Transition Note (Addendum)
Transition of Care [facility]) - CM/SW Discharge Note   Patient Details  Name: [name]. MRN: [id number] Date of Birth: [birth year]  Transition of Care [facility]) CM/SW Contact:  Magnus Ivan, LCSW Phone Number: 03/23/2022, 9:46 AM   Clinical Narrative:    Patient to DC home today. Spoke to patient who confirms he wants to go home with [facility]. Patient confirms he needs EMS transport and confirmed address listed on chart. Patient confirms he has home o2 already, but is not sure of agency. CSW notified Merleen Nicely with [facility] of DC home today.  CSW completed EMS paperwork. Will call for [facility] when patient is ready for transport.   11:28- Called [facility] for transport, patient is next on the list. RN notified.   2:43- Call from [facility] with [facility], states wife is requesting to add RN and Aide to [facility] orders. Merleen Nicely reached out to PCP office but they are closed due to the holiday.  Asked MD if this is possible. Per MD, it is too late to add RN due to not being able to edit the orders after patient DC. Aide is already is in the orders.    Final next level of care: [place] Barriers to Discharge: Barriers Resolved   Patient Goals and CMS Choice Patient states their goals for this hospitalization and ongoing recovery are:: home with home health CMS Medicare.gov Compare Post Acute Care list provided to:: Patient Choice offered to / list presented to : Patient  Discharge Placement                Patient to be transferred to facility by: [facility] Name of family member notified: patient Patient and family notified of of transfer: 03/23/22  Discharge Plan and Services   Discharge Planning Services: CM Consult Post Acute Care Choice: Home Health                    HH Arranged: RN, PT, OT, Nurse's Aide [facility] Agency: [facility] Date [place]: 03/23/22 Time [place]: 0347 Representative spoke with at [place]: [place] ([place]) Interventions     Readmission Risk Interventions    02/13/2022    4:57 PM 09/29/2021   11:12 AM  Readmission Risk Prevention Plan  Transportation Screening Complete Complete  PCP or Specialist Appt within 3-5 Days  Complete  HRI or [place]  Complete  Social Work Consult for [place] Planning/Counseling  Complete  Palliative Care Screening  Not Applicable  Medication Review Press photographer) Complete Complete  PCP or Specialist appointment within 3-5 days of discharge Complete   HRI or [place] Complete   SW Recovery Care/Counseling Consult Complete   [place] Not Applicable

## 2022-03-23 NOTE — Discharge Summary (Signed)
Physician Discharge Summary   Patient: [name]. MRN: [id number] DOB: 01-Jul-1955  Admit date:     03/15/2022  Discharge date: 03/23/22  Discharge Physician: Fritzi Mandes   PCP: [name], MD   Recommendations at discharge:   follow-up Dr. Ester Rink in 1 to 2 weeks. Dr. Erlene Quan to decide regarding blood thinners to be resumed are not as outpatient follow-up PCP in 1 to 2 weeks  Discharge Diagnoses: Principal Problem:   Septic shock ([place]) Active Problems:   Sepsis secondary to UTI ([place])   Hyperglycemia   Bacteremia due to Klebsiella pneumoniae   Perinephric hematoma   Hospital Course: [name]. is a 66 y.o. male with medical history significant for asthma, COPD, CHF, diabetes, history of left BKA, BPH, hypertension, dyslipidemia, hypothyroidism, recent hospitalization for fall and atrial fibrillation with RVR in October 2023, s/p cardioversion on 02/13/2022.  He presented to the hospital because of confusion and hyperglycemia.  His wife said he had tried to use the lift to get patient into the bed the night prior to admission but was unsuccessful and patient ended up on the floor.    CT abdomen and pelvis showed large subcapsular hematoma of the right kidney and large amount of gas concerning for emphysematous pyelonephritis.   He was admitted to the hospital for septic shock secondary to acute UTI.  He was treated with IV fluids and empiric IV antibiotics.  He also required Levophed for septic shock.  He was also treated with IV insulin drip for hyperosmolar hyperglycemic state.  He had AKI and hyponatremia that improved with IV fluids.   Severe sepsis with septic shock secondary to acute UTI/emphysematous perinephritis Klebsiella sepsis -- patient was admitted in the ICU. He was on IV vasopressin is now discontinued -- currently on IV cefazolin--completed IV abxs  -- patient is afebrile. White count normal -- patient was seen by urology Dr.  Erlene Quan recommends against percutaneous drainage. Follow-up as outpatient in 7-10 days and will be assessed at that time regarding blood thinners   Moderate right pleural effusion -- status post thoracentesis-- 300 mL fluid removed   Chronic hypoxic respiratory failure on chronic home oxygen -- stable   Type II diabetes with hyperglycemia -- continue Levemir   Paroxysmal atrial flutter and fibrillation status post cardioversion February 13, 2022 -- continue amiodarone, cardizem CD -- hold eliquis for now given large perinephric hematoma per Dr. Erlene Quan. Patient is to follow-up in two weeks and decision regarding eliquis resumption will be based on follow-up visit.   acute renal failure in the setting of septic shock  --resolved       Procedures: Family communication : wife on the phone Consults : urology CODE STATUS: full DVT Prophylaxis : SCD  D/c to home, pt is best at baseline      Consultants: urology Procedures performed: none  Disposition: Home health Diet recommendation:  Discharge Diet Orders (From admission, onward)     Start     Ordered   03/23/22 0000  Diet - low sodium heart healthy        03/23/22 0843           Cardiac and Carb modified diet DISCHARGE MEDICATION: Allergies as of 03/23/2022       Reactions   Penicillins Other (See Comments)   Happened at 66 years old and pt. stated he passed out  He has tolerated amoxicillin/clavulanate and ampicillin/sulbactam   Statins    Other reaction(s): Muscle Pain Causes  legs to ache per pt   Metformin And Related Diarrhea        Medication List     STOP taking these medications    apixaban 5 MG Tabs tablet Commonly known as: ELIQUIS   aspirin EC 81 MG tablet   insulin aspart 100 UNIT/ML injection Commonly known as: novoLOG   nystatin powder Commonly known as: MYCOSTATIN/NYSTOP       TAKE these medications    acetaminophen 325 MG tablet Commonly known as: TYLENOL Take 2 tablets  (650 mg total) by mouth every 6 (six) hours as needed for mild pain (or Fever >/= 101).   amiodarone 200 MG tablet Commonly known as: PACERONE Take 200 mg by mouth daily.   ascorbic acid 500 MG tablet Commonly known as: VITAMIN C Take 500 mg by mouth daily.   budesonide 0.5 MG/2ML nebulizer solution Commonly known as: PULMICORT Take 2 mLs (0.5 mg total) by nebulization 2 (two) times daily.   Cholecalciferol 25 MCG (1000 UT) tablet Take 1,000 Units by mouth daily.   diltiazem 120 MG 12 hr capsule Commonly known as: CARDIZEM SR Take 1 capsule (120 mg total) by mouth every 12 (twelve) hours.   ezetimibe 10 MG tablet Commonly known as: ZETIA Take 10 mg by mouth at bedtime.   ferrous sulfate 325 (65 FE) MG tablet Take 325 mg by mouth daily with breakfast.   finasteride 5 MG tablet Commonly known as: PROSCAR Take 1 tablet (5 mg total) by mouth daily.   furosemide 20 MG tablet Commonly known as: LASIX Take 2 tablets (40 mg total) by mouth daily.   HumaLOG 100 UNIT/ML injection Generic drug: insulin lispro Inject 15 Units into the skin 3 (three) times daily with meals. >200   insulin glargine-yfgn 100 UNIT/ML injection Commonly known as: SEMGLEE Inject 0.25 mLs (25 Units total) into the skin at bedtime. What changed: how much to take   levothyroxine 25 MCG tablet Commonly known as: SYNTHROID Take 1 tablet (25 mcg total) by mouth daily at 6 (six) AM.   multivitamin with minerals tablet Take 1 tablet by mouth daily.   primidone 250 MG tablet Commonly known as: MYSOLINE Take 250 mg by mouth 2 (two) times daily.   risperiDONE 0.5 MG tablet Commonly known as: RISPERDAL Take 1 tablet (0.5 mg total) by mouth 2 (two) times daily.   tamsulosin 0.4 MG Caps capsule Commonly known as: FLOMAX Take 0.4 mg by mouth daily.   traMADol 50 MG tablet Commonly known as: ULTRAM Take 1 tablet (50 mg total) by mouth daily as needed for moderate pain.   Vitamin B-12 5000 MCG  Tbdp Take 10,000 mcg by mouth daily.               Discharge Care Instructions  (From admission, onward)           Start     Ordered   03/23/22 0000  Discharge wound care:       Comments: Pressure Injury 10/27/21 Coccyx Mid Stage 2 -  Partial thickness loss of dermis presenting as a shallow open injury with a red, pink wound bed without slough. pale wound bed, pink/purple blanchable surrounding area 146 days    Pressure Injury 11/01/21 Buttocks Left Stage 2 -  Partial thickness loss of dermis presenting as a shallow open injury with a red, pink wound bed without slough.   Foam padding, freq turns in bed   03/23/22 0843  Follow-up Information     [name], MD. Schedule an appointment as soon as possible for a visit in 1 week(s).   Specialty: Internal Medicine Why: pt/wife to make appt Contact information: [place] [address] [phone]         Hollice Espy, MD. Schedule an appointment as soon as possible for a visit in 1 week(s).   Specialty: Urology Why: Perinephric hematoma f/u Contact information: [place] [id number] [phone]                 Condition at discharge: fair  The results of significant diagnostics from this hospitalization (including imaging, microbiology, ancillary and laboratory) are listed below for reference.   Imaging Studies: US THORACENTESIS ASP PLEURAL SPACE W/IMG GUIDE  Result Date: 03/20/2022 INDICATION: Right pleural effusion EXAM: ULTRASOUND GUIDED RIGHT THORACENTESIS MEDICATIONS: 20 mL 1% lidocaine COMPLICATIONS: None immediate. PROCEDURE: An ultrasound guided thoracentesis was thoroughly discussed with the patient and questions answered. The benefits, risks, alternatives and complications were also discussed. The patient understands and wishes to proceed with the procedure. Written consent was obtained. Ultrasound  was performed to localize and mark an adequate pocket of fluid in the right chest. The area was then prepped and draped in the normal sterile fashion. 1% Lidocaine was used for local anesthesia. An unsuccessful thoracentesis attempt was made by Reatha Armour, PA-C using a 6 French Safe-T-Centesis. Dr. Markus Daft was called to the room, and under ultrasound guidance a 19 gauge, 10-cm, Yueh catheter was introduced. Thoracentesis was performed. The catheter was removed and a dressing applied. FINDINGS: A total of approximately 300 cc of red fluid was removed. Samples were sent to the laboratory as requested by the clinical team. IMPRESSION: Successful ultrasound guided right thoracentesis yielding 300 cc of pleural fluid. Follow-up chest x-ray revealed no evidence of pneumothorax. Read by: Reatha Armour, PA-C Electronically Signed   By: Markus Daft M.D.   On: 03/20/2022 16:27   DG Chest Port 1 View  Result Date: 03/20/2022 CLINICAL DATA:  Right pleural effusion post thoracentesis EXAM: PORTABLE CHEST 1 VIEW COMPARISON:  Chest 03/16/2022 FINDINGS: Postop CABG. Mild cardiac enlargement. Pulmonary vascular congestion with progression from the prior study. No significant pleural effusion identified. No pneumothorax. Left jugular central venous catheter tip in the left innominate vein which has pulled back slightly from the prior study. IMPRESSION: 1. Negative for pneumothorax following right thoracentesis. 2. Pulmonary edema with progression from the prior study Electronically Signed   By: Franchot Gallo M.D.   On: 03/20/2022 15:55   CT ABDOMEN PELVIS WO CONTRAST  Result Date: 03/19/2022 CLINICAL DATA:  History of pyelonephritis by report also found to have perinephric hematoma EXAM: CT ABDOMEN AND PELVIS WITHOUT CONTRAST TECHNIQUE: Multidetector CT imaging of the abdomen and pelvis was performed following the standard protocol without IV contrast. RADIATION DOSE REDUCTION: This exam was performed according to the  departmental dose-optimization program which includes automated exposure control, adjustment of the mA and/or kV according to patient size and/or use of iterative reconstruction technique. COMPARISON:  March 16, 2022 FINDINGS: Lower chest: Moderate RIGHT effusion. Basilar airspace disease is slightly increased in the RIGHT chest as is the pleural fluid. Very small LEFT pleural effusion slightly increased as well. Aortic valvular calcifications, mitral annular calcifications and coronary artery disease similar to previous imaging. Low attenuation in the cardiac blood pool. Hepatobiliary: Smooth hepatic contours. No pericholecystic stranding. No gross biliary duct distension. No visible  lesion on noncontrast imaging. Pancreas: Pancreas with normal contours, no signs of inflammation or peripancreatic fluid. Spleen: Normal. Adrenals/Urinary Tract: Adrenal glands are normal. RIGHT kidney: Large perinephric and subcapsular hematoma with compression of renal parenchyma. At the level of the interpolar RIGHT kidney this measures approximately 8 x 4.2 cm previously 8.3 x 4.3 cm. Hematoma extends into the perinephric space. Density of this hematoma is similar to previous imaging. The amount of hematoma in the inferior perinephric and posterior pararenal space is perhaps very slightly diminished. No nephrolithiasis. No ureteral calculi or obstruction on the RIGHT. Gas along the RIGHT renal parenchyma and or collecting system elements of the lower and interpolar RIGHT kidney have diminished in terms of components extending into are involving potentially the renal parenchyma. Gas tracks inferiorly into the hematoma immediately subjacent to the RIGHT kidney. In the coronal plane gas measures 5.7 x 2.4 cm along the lower margin of the subcapsular portion of the hematoma previously 5.5 x 3.0 cm. Smaller amounts of gas are seen in the perinephric aspect of the hematoma that were not present previously,, for instance on image 61/5  where there is a 1.6 x 2 cm area of gas within this inferior perinephric hematoma and small amounts of gas tracking from the subcapsular hematoma into the perinephric hematoma. Perinephric hematoma as measured in the coronal plane (image 61/5 6.1 x 2.8 cm as compared to 7.5 x 3.1 cm. LEFT kidney: No signs of hematoma, nephrolithiasis or hydronephrosis on the LEFT. Renal cortical scarring on the LEFT. Perinephric stranding on the LEFT. Stomach/Bowel: No acute gastrointestinal findings. Vascular/Lymphatic: Aortic atherosclerosis. No sign of aneurysm. Smooth contour of the IVC. There is no gastrohepatic or hepatoduodenal ligament lymphadenopathy. No retroperitoneal or mesenteric lymphadenopathy. No pelvic sidewall lymphadenopathy. Atherosclerotic changes are moderate and calcified. Reproductive: Unremarkable by CT. Other: Body wall edema. Stranding throughout the RIGHT retroperitoneum. Musculoskeletal: Post sternotomy. Spinal degenerative changes. No acute or destructive bone process. IMPRESSION: 1. Persistent large subcapsular hematoma with compression of renal parenchyma not substantially changed in size though potentially with slight decreased size of hematoma in the perinephric space and pararenal space below the RIGHT kidney. 2. Diminished gas within RIGHT renal parenchyma and or collecting system elements of the RIGHT kidney but with propagation of gas into the perinephric/inferior pararenal extension of the hematoma. Findings may reflect partial decompression of gas collection in the subcapsular space on the previous study though may show very slight increase overall since previous imaging. While the amount of parenchymal or collecting system involvement currently appears Wood, findings remain consistent with gas-forming infection and could be a result of emphysematous pyelonephritis. Development of gas outside of the renal capsule may herald early retroperitoneal extension of this process. 3. Moderate RIGHT  effusion slightly increased in the RIGHT chest as is the pleural fluid. Very small LEFT pleural effusion slightly increased as well. Findings could be the result of reactive effusion in the RIGHT chest. Correlate with any signs of respiratory. Infection 4. Low attenuation in the cardiac blood pool. This can be seen in the setting of anemia. 5. Aortic atherosclerosis and coronary artery disease. Aortic Atherosclerosis (ICD10-I70.0). These results will be called to the ordering clinician or representative by the Radiologist Assistant, and communication documented in the PACS or [employer]. Electronically Signed   By: Zetta Bills M.D.   On: 03/19/2022 10:49   CT ABDOMEN PELVIS WO CONTRAST  Result Date: 03/16/2022 CLINICAL DATA:  66 year old male with history of sepsis. Suspected pyelonephritis. EXAM: CT ABDOMEN AND PELVIS WITHOUT  CONTRAST TECHNIQUE: Multidetector CT imaging of the abdomen and pelvis was performed following the standard protocol without IV contrast. RADIATION DOSE REDUCTION: This exam was performed according to the departmental dose-optimization program which includes automated exposure control, adjustment of the mA and/or kV according to patient size and/or use of iterative reconstruction technique. COMPARISON:  No priors. FINDINGS: Lower chest: Small right pleural effusion with passive subsegmental atelectasis in the dependent portions of the right lower lobe. Cardiomegaly. Calcifications of the mitral annulus and mitral subvalvular apparatus. Atherosclerotic calcifications in the right coronary artery. Hepatobiliary: Liver has a nodular contour, suggesting underlying cirrhosis. There is also heterogeneous low attenuation throughout the hepatic parenchyma, indicative of a background of hepatic steatosis. No discrete cystic or solid hepatic lesions are confidently identified upon today's noncontrast CT examination. A small amount of amorphous increased attenuation is noted lying  dependently in the gallbladder, likely to reflect biliary sludge and/or tiny noncalcified gallstones. Gallbladder is not distended. No surrounding inflammatory changes to suggest an acute cholecystitis at this time. Pancreas: No definite pancreatic mass or peripancreatic fluid collections or inflammatory changes are noted on today's noncontrast CT examination. Spleen: Unremarkable. Adrenals/Urinary Tract: There is a large amount of high attenuation material adjacent to the right kidney, much of which appears confined within Gerota's fascia and is compatible with a large subcapsular hematoma, measuring up to 4.8 x 9.0 x 13.8 cm (axial image 36 of series 2 and sagittal image 56 of series 6). This compresses the adjacent kidney, displacing it slightly anteriorly. In addition, there is a large amount of gas in the inferolateral aspect of the right kidney extending into the adjacent subcapsular hematoma. In the posterolateral aspect of the interpolar region of the left kidney (axial image 39 of series 2) there is an exophytic 1.6 cm low-attenuation lesion, incompletely characterized on today's noncontrast CT examination, but statistically likely to represent cysts. No hydroureteronephrosis. Urinary bladder is largely decompressed, but otherwise unremarkable in appearance. Stomach/Bowel: Unenhanced appearance of the stomach is normal. There is no pathologic dilatation of small bowel or colon. A few scattered colonic diverticuli are noted, without definite focal surrounding inflammatory changes to clearly indicate an acute diverticulitis at this time. Normal appendix. Vascular/Lymphatic: Extensive atherosclerosis of the abdominal aorta and pelvic vasculature. No definite lymphadenopathy noted in the abdomen or pelvis. Reproductive: Prostate gland and seminal vesicles are grossly unremarkable in appearance. Other: Large amount of high attenuation fluid in the right perinephric space extending caudally throughout the  retroperitoneum dissecting all the way down to the low anatomic pelvis and right pelvic sidewall. Small amount of adjacent intermediate to high attenuation fluid in the right pericolic gutter as well. Small volume of left perinephric fluid, low-attenuation. No pneumoperitoneum. Musculoskeletal: There are no aggressive appearing lytic or blastic lesions noted in the visualized portions of the skeleton. IMPRESSION: 1. Large subcapsular hematoma associated with the right kidney causing compression of the right kidney; clinical correlation for signs and symptoms of Page kidney is recommended. 2. Notably, there is a large amount of gas in the inferolateral aspect of the right kidney, extending into the overlying subcapsular hematoma. Findings are concerning for emphysematous pyelonephritis. Correlation with urinalysis is recommended. 3. Hemorrhage from the right subcapsular hematoma has extended outward into the right retroperitoneum and adjacent peritoneal spaces, as above. 4. Small amount of intermediate attenuation material lying dependently in the gallbladder, likely to reflect biliary sludge and/or small gallstones. No findings to suggest an acute cholecystitis at this time. 5. Hepatic steatosis with evidence of hepatic cirrhosis, as  above. Colonic diverticulosis without evidence of acute diverticulitis at this time. 6. Aortic atherosclerosis, as well as right coronary artery disease. 7. Small right pleural effusion with areas of dependent atelectasis in the right lung base. 8. Additional incidental findings, as above. Electronically Signed   By: Vinnie Langton M.D.   On: 03/16/2022 06:18   DG Chest 1 View  Result Date: 03/16/2022 CLINICAL DATA:  Encounter for central line placement EXAM: CHEST  1 VIEW COMPARISON:  Yesterday FINDINGS: Left IJ line with tip at the SVC origin. No pneumothorax or new mediastinal widening. There is cardiomegaly and aortic tortuosity accentuated by rotation. Congested appearance  of vessels. Prior CABG. IMPRESSION: 1. Left IJ line with tip near the upper SVC.  No pneumothorax. 2. Increased vascular congestion. Electronically Signed   By: Jorje Guild M.D.   On: 03/16/2022 05:36   US Abdomen Limited RUQ (LIVER/GB)  Result Date: 03/15/2022 CLINICAL DATA:  Elevated bilirubin. EXAM: ULTRASOUND ABDOMEN LIMITED RIGHT UPPER QUADRANT COMPARISON:  None Available. FINDINGS: Gallbladder: There is gallstone. No gallbladder wall thickening or pericholecystic fluid. Negative sonographic Murphy's sign. Common bile duct: Diameter: 4 mm Liver: The liver is unremarkable. Portal vein is patent on color Doppler imaging with normal direction of blood flow towards the liver. Other: None. IMPRESSION: Cholelithiasis without sonographic evidence of acute cholecystitis. Electronically Signed   By: Anner Crete M.D.   On: 03/15/2022 21:16   DG Chest Port 1 View  Result Date: 03/15/2022 CLINICAL DATA:  Questionable sepsis. Evaluate for abnormality. Hyperglycemia. EXAM: PORTABLE CHEST 1 VIEW COMPARISON:  AP chest 02/13/2022, 10/24/2021 FINDINGS: Status post median sternotomy. Cardiac silhouette is again moderately enlarged. Tortuous thoracic aorta is similar to prior. Improved aeration of bilateral lungs with apparent resolution of the prior interstitial thickening. No focal airspace opacity to indicate pneumonia. No pleural effusion or pneumothorax. Moderate multilevel disc space narrowing and bridging osteophytes of the thoracic spine. IMPRESSION: Stable moderate cardiomegaly. Improved aeration of bilateral lungs with apparent resolution of the prior interstitial pulmonary edema. No focal airspace opacity to indicate pneumonia. Electronically Signed   By: Yvonne Kendall M.D.   On: 03/15/2022 15:56    Microbiology: Results for orders placed or performed during the hospital encounter of 03/15/22  Resp Panel by RT-PCR (Flu A&B, Covid) Anterior Nasal Swab     Status: None   Collection Time: 03/15/22   3:19 PM   Specimen: Anterior Nasal Swab  Result Value Ref Range Status   SARS Coronavirus 2 by RT PCR NEGATIVE NEGATIVE Final    Comment: (NOTE) SARS-CoV-2 target nucleic acids are NOT DETECTED.  The SARS-CoV-2 RNA is generally detectable in upper respiratory specimens during the acute phase of infection. The lowest concentration of SARS-CoV-2 viral copies this assay can detect is 138 copies/mL. A negative result does not preclude SARS-Cov-2 infection and should not be used as the sole basis for treatment or other patient management decisions. A negative result may occur with  improper specimen collection/handling, submission of specimen other than nasopharyngeal swab, presence of viral mutation(s) within the areas targeted by this assay, and inadequate number of viral copies(<138 copies/mL). A negative result must be combined with clinical observations, patient history, and epidemiological information. The expected result is Negative.  Fact Sheet for Patients:  EntrepreneurPulse.com.au  Fact Sheet for Healthcare Providers:  IncredibleEmployment.be  This test is no t yet approved or cleared by the [place] FDA and  has been authorized for detection and/or diagnosis of SARS-CoV-2 by FDA under an Emergency Use Authorization (  EUA). This EUA will remain  in effect (meaning this test can be used) for the duration of the COVID-19 declaration under Section 564(b)(1) of the Act, 21 U.S.C.section 360bbb-3(b)(1), unless the authorization is terminated  or revoked sooner.       Influenza A by PCR NEGATIVE NEGATIVE Final   Influenza B by PCR NEGATIVE NEGATIVE Final    Comment: (NOTE) The Xpert Xpress SARS-CoV-2/FLU/RSV plus assay is intended as an aid in the diagnosis of influenza from Nasopharyngeal swab specimens and should not be used as a sole basis for treatment. Nasal washings and aspirates are unacceptable for Xpert Xpress  SARS-CoV-2/FLU/RSV testing.  Fact Sheet for Patients: EntrepreneurPulse.com.au  Fact Sheet for Healthcare Providers: IncredibleEmployment.be  This test is not yet approved or cleared by the [place] FDA and has been authorized for detection and/or diagnosis of SARS-CoV-2 by FDA under an Emergency Use Authorization (EUA). This EUA will remain in effect (meaning this test can be used) for the duration of the COVID-19 declaration under Section 564(b)(1) of the Act, 21 U.S.C. section 360bbb-3(b)(1), unless the authorization is terminated or revoked.  Performed at [facility], [address] [place], [place] [address]   Blood Culture (routine x 2)     Status: Abnormal   Collection Time: 03/15/22  3:19 PM   Specimen: BLOOD LEFT HAND  Result Value Ref Range Status   Specimen Description   Final    BLOOD LEFT HAND Performed at [facility], [address] [place], [place] [address]    Special Requests   Final    BOTTLES DRAWN AEROBIC AND ANAEROBIC Blood Culture adequate volume Performed at [facility] [facility], [address] [place], [place] [address]    Culture  Setup Time   Final    GRAM NEGATIVE RODS IN BOTH AEROBIC AND ANAEROBIC BOTTLES CRITICAL RESULT CALLED TO, READ BACK BY AND VERIFIED WITH: NATHAN BELUE 03/16/2022 AT 0408 SRR Performed at [facility], [place] [address], [place], [place] [address]    Culture KLEBSIELLA PNEUMONIAE (A)  Final   Report Status 03/18/2022 FINAL  Final   Organism ID, Bacteria KLEBSIELLA PNEUMONIAE  Final      Susceptibility   Klebsiella pneumoniae - MIC*    AMPICILLIN >=32 RESISTANT Resistant     CEFAZOLIN <=4 SENSITIVE Sensitive     CEFEPIME <=0.12 SENSITIVE Sensitive     CEFTAZIDIME <=1 SENSITIVE Sensitive     CEFTRIAXONE <=0.25 SENSITIVE Sensitive     CIPROFLOXACIN <=0.25 SENSITIVE Sensitive     GENTAMICIN <=1 SENSITIVE Sensitive     IMIPENEM <=0.25 SENSITIVE  Sensitive     TRIMETH/SULFA <=20 SENSITIVE Sensitive     AMPICILLIN/SULBACTAM 4 SENSITIVE Sensitive     PIP/TAZO <=4 SENSITIVE Sensitive     * KLEBSIELLA PNEUMONIAE  Urine Culture     Status: Abnormal   Collection Time: 03/15/22  3:19 PM   Specimen: In/Out Cath Urine  Result Value Ref Range Status   Specimen Description   Final    IN/OUT CATH URINE Performed at [facility], [address], [place], [place] [address]    Special Requests   Final    NONE Performed at [facility], [address] [place], [place] [address]    Culture >=100,000 COLONIES/mL KLEBSIELLA PNEUMONIAE (A)  Final   Report Status 03/17/2022 FINAL  Final   Organism ID, Bacteria KLEBSIELLA PNEUMONIAE (A)  Final      Susceptibility   Klebsiella pneumoniae - MIC*    AMPICILLIN >=  32 RESISTANT Resistant     CEFAZOLIN <=4 SENSITIVE Sensitive     CEFEPIME <=0.12 SENSITIVE Sensitive     CEFTRIAXONE <=0.25 SENSITIVE Sensitive     CIPROFLOXACIN <=0.25 SENSITIVE Sensitive     GENTAMICIN <=1 SENSITIVE Sensitive     IMIPENEM <=0.25 SENSITIVE Sensitive     NITROFURANTOIN 64 INTERMEDIATE Intermediate     TRIMETH/SULFA <=20 SENSITIVE Sensitive     AMPICILLIN/SULBACTAM 4 SENSITIVE Sensitive     PIP/TAZO <=4 SENSITIVE Sensitive     * >=100,000 COLONIES/mL KLEBSIELLA PNEUMONIAE  Blood Culture ID Panel (Reflexed)     Status: Abnormal   Collection Time: 03/15/22  3:19 PM  Result Value Ref Range Status   Enterococcus faecalis NOT DETECTED NOT DETECTED Final   Enterococcus Faecium NOT DETECTED NOT DETECTED Final   Listeria monocytogenes NOT DETECTED NOT DETECTED Final   Staphylococcus species NOT DETECTED NOT DETECTED Final   Staphylococcus aureus (BCID) NOT DETECTED NOT DETECTED Final   Staphylococcus epidermidis NOT DETECTED NOT DETECTED Final   Staphylococcus lugdunensis NOT DETECTED NOT DETECTED Final   Streptococcus species NOT DETECTED NOT DETECTED Final   Streptococcus agalactiae NOT DETECTED NOT  DETECTED Final   Streptococcus pneumoniae NOT DETECTED NOT DETECTED Final   Streptococcus pyogenes NOT DETECTED NOT DETECTED Final   A.calcoaceticus-baumannii NOT DETECTED NOT DETECTED Final   Bacteroides fragilis NOT DETECTED NOT DETECTED Final   Enterobacterales DETECTED (A) NOT DETECTED Final    Comment: Enterobacterales represent a large order of gram negative bacteria, not a single organism. CRITICAL RESULT CALLED TO, READ BACK BY AND VERIFIED WITH: NATHAN BELUE 03/16/2022 AT 0408 SRR    Enterobacter cloacae complex NOT DETECTED NOT DETECTED Final   Escherichia coli NOT DETECTED NOT DETECTED Final   Klebsiella aerogenes NOT DETECTED NOT DETECTED Final   Klebsiella oxytoca NOT DETECTED NOT DETECTED Final   Klebsiella pneumoniae DETECTED (A) NOT DETECTED Final    Comment: CRITICAL RESULT CALLED TO, READ BACK BY AND VERIFIED WITH: NATHAN BELUE 03/16/2022 AT 0408 SRR    Proteus species NOT DETECTED NOT DETECTED Final   Salmonella species NOT DETECTED NOT DETECTED Final   Serratia marcescens NOT DETECTED NOT DETECTED Final   Haemophilus influenzae NOT DETECTED NOT DETECTED Final   Neisseria meningitidis NOT DETECTED NOT DETECTED Final   Pseudomonas aeruginosa NOT DETECTED NOT DETECTED Final   Stenotrophomonas maltophilia NOT DETECTED NOT DETECTED Final   Candida albicans NOT DETECTED NOT DETECTED Final   Candida auris NOT DETECTED NOT DETECTED Final   Candida glabrata NOT DETECTED NOT DETECTED Final   Candida krusei NOT DETECTED NOT DETECTED Final   Candida parapsilosis NOT DETECTED NOT DETECTED Final   Candida tropicalis NOT DETECTED NOT DETECTED Final   Cryptococcus neoformans/gattii NOT DETECTED NOT DETECTED Final   CTX-M ESBL NOT DETECTED NOT DETECTED Final   Carbapenem resistance IMP NOT DETECTED NOT DETECTED Final   Carbapenem resistance KPC NOT DETECTED NOT DETECTED Final   Carbapenem resistance NDM NOT DETECTED NOT DETECTED Final   Carbapenem resist OXA 48 LIKE NOT  DETECTED NOT DETECTED Final   Carbapenem resistance VIM NOT DETECTED NOT DETECTED Final    Comment: Performed at [facility], [address] [place], [place] [address]  Blood Culture (routine x 2)     Status: None   Collection Time: 03/15/22  9:09 PM   Specimen: BLOOD  Result Value Ref Range Status   Specimen Description BLOOD RIGHT HAND  Final   Special Requests   Final    BOTTLES DRAWN  AEROBIC AND ANAEROBIC Blood Culture results may not be optimal due to an inadequate volume of blood received in culture bottles   Culture   Final    NO GROWTH 5 DAYS Performed at [facility], [address] [place], [place] [address]    Report Status 03/20/2022 FINAL  Final  MRSA Next Gen by PCR, Nasal     Status: Abnormal   Collection Time: 03/15/22 10:02 PM   Specimen: Nasal Mucosa; Nasal Swab  Result Value Ref Range Status   MRSA by PCR Next Gen DETECTED (A) NOT DETECTED Final    Comment: RESULT CALLED TO, READ BACK BY AND VERIFIED WITH: Burnetta Sabin 03/15/2022 AT 2329 SRR (NOTE) The GeneXpert MRSA Assay (FDA approved for NASAL specimens only), is one component of a comprehensive MRSA colonization surveillance program. It is not intended to diagnose MRSA infection nor to guide or monitor treatment for MRSA infections. Test performance is not FDA approved in patients Wood than 67 years old. Performed at [facility], [address] [place], [place] [address]     Labs: CBC: Recent Labs  Lab 03/16/22 1320 03/16/22 2007 03/17/22 0318 03/18/22 0422 03/20/22 0439  WBC  --  7.0 7.3 6.8 7.7  NEUTROABS  --   --   --   --  5.3  HGB 8.2* 7.6* 8.1* 7.9* 8.4*  HCT 24.7* 23.3* 24.3* 23.4* 26.5*  MCV  --  90.3 90.7 90.7 93.3  PLT  --  104* 104* 122* 384   Basic Metabolic Panel: Recent Labs  Lab 03/17/22 0318 03/18/22 0422 03/19/22 0439 03/20/22 0439 03/21/22 0338  NA 133* 133* 139 141 137  K 3.8 3.7 3.5 3.7 3.7  CL 100 102 109 109 106  CO2 '24 24  26 28 27  '$ GLUCOSE 226* 199* 156* 110* 190*  BUN 53* 42* 32* 28* 27*  CREATININE 2.06* 1.45* 1.43* 1.31* 1.24  CALCIUM 8.0* 8.2* 8.2* 8.1* 8.2*  MG 1.9 1.9 1.9  --   --   PHOS 4.0 2.4* 2.6  --   --    Liver Function Tests: Recent Labs  Lab 03/20/22 0439  AST 26  ALT 23  ALKPHOS 159*  BILITOT 0.5  PROT 5.5*  ALBUMIN 2.1*   CBG: Recent Labs  Lab 03/22/22 1553 03/22/22 2008 03/23/22 0010 03/23/22 0426 03/23/22 0748  GLUCAP 188* 252* 241* 169* 142*    Discharge time spent: greater than 30 minutes.  Signed: Fritzi Mandes, MD [facility] 03/23/2022

## 2022-03-24 LAB — BODY FLUID CULTURE W GRAM STAIN
Culture: NO GROWTH
Gram Stain: NONE SEEN

## 2022-03-26 ENCOUNTER — Other Ambulatory Visit: Payer: Self-pay

## 2022-03-26 DIAGNOSIS — Z20822 Contact with and (suspected) exposure to covid-19: Secondary | ICD-10-CM | POA: Insufficient documentation

## 2022-03-26 DIAGNOSIS — R1030 Lower abdominal pain, unspecified: Secondary | ICD-10-CM | POA: Insufficient documentation

## 2022-03-26 DIAGNOSIS — J449 Chronic obstructive pulmonary disease, unspecified: Secondary | ICD-10-CM | POA: Diagnosis not present

## 2022-03-26 DIAGNOSIS — I5033 Acute on chronic diastolic (congestive) heart failure: Secondary | ICD-10-CM | POA: Diagnosis not present

## 2022-03-26 DIAGNOSIS — R1084 Generalized abdominal pain: Secondary | ICD-10-CM | POA: Diagnosis not present

## 2022-03-26 DIAGNOSIS — E119 Type 2 diabetes mellitus without complications: Secondary | ICD-10-CM | POA: Diagnosis not present

## 2022-03-26 DIAGNOSIS — E039 Hypothyroidism, unspecified: Secondary | ICD-10-CM | POA: Diagnosis not present

## 2022-03-26 DIAGNOSIS — I2510 Atherosclerotic heart disease of native coronary artery without angina pectoris: Secondary | ICD-10-CM | POA: Diagnosis not present

## 2022-03-26 DIAGNOSIS — I509 Heart failure, unspecified: Secondary | ICD-10-CM | POA: Diagnosis not present

## 2022-03-26 DIAGNOSIS — I110 Hypertensive heart disease with heart failure: Secondary | ICD-10-CM | POA: Insufficient documentation

## 2022-03-26 DIAGNOSIS — R739 Hyperglycemia, unspecified: Secondary | ICD-10-CM | POA: Diagnosis not present

## 2022-03-26 DIAGNOSIS — R197 Diarrhea, unspecified: Secondary | ICD-10-CM | POA: Insufficient documentation

## 2022-03-26 DIAGNOSIS — R531 Weakness: Secondary | ICD-10-CM | POA: Diagnosis not present

## 2022-03-26 DIAGNOSIS — J45909 Unspecified asthma, uncomplicated: Secondary | ICD-10-CM | POA: Diagnosis not present

## 2022-03-26 DIAGNOSIS — R0902 Hypoxemia: Secondary | ICD-10-CM | POA: Diagnosis not present

## 2022-03-26 LAB — COMPREHENSIVE METABOLIC PANEL
ALT: 12 U/L (ref 0–44)
AST: 19 U/L (ref 15–41)
Albumin: 2.7 g/dL — ABNORMAL LOW (ref 3.5–5.0)
Alkaline Phosphatase: 207 U/L — ABNORMAL HIGH (ref 38–126)
Anion gap: 9 (ref 5–15)
BUN: 18 mg/dL (ref 8–23)
CO2: 30 mmol/L (ref 22–32)
Calcium: 8.6 mg/dL — ABNORMAL LOW (ref 8.9–10.3)
Chloride: 103 mmol/L (ref 98–111)
Creatinine, Ser: 0.99 mg/dL (ref 0.61–1.24)
GFR, Estimated: >60 mL/min (ref >60)
Glucose, Bld: 230 mg/dL — ABNORMAL HIGH (ref 70–99)
Potassium: 3.7 mmol/L (ref 3.5–5.1)
Sodium: 142 mmol/L (ref 135–145)
Total Bilirubin: 0.6 mg/dL (ref 0.3–1.2)
Total Protein: 7.4 g/dL (ref 6.5–8.1)

## 2022-03-26 LAB — CBC
HCT: 32.2 % — ABNORMAL LOW (ref 39.0–52.0)
Hemoglobin: 10 g/dL — ABNORMAL LOW (ref 13.0–17.0)
MCH: 29.4 pg (ref 26.0–34.0)
MCHC: 31.1 g/dL (ref 30.0–36.0)
MCV: 94.7 fL (ref 80.0–100.0)
Platelets: 315 10*3/uL (ref 150–400)
RBC: 3.4 MIL/uL — ABNORMAL LOW (ref 4.22–5.81)
RDW: 14.6 % (ref 11.5–15.5)
WBC: 11.5 10*3/uL — ABNORMAL HIGH (ref 4.0–10.5)
nRBC: 0 % (ref 0.0–0.2)

## 2022-03-26 LAB — LIPASE, BLOOD: Lipase: 31 U/L (ref 11–51)

## 2022-03-26 LAB — LACTIC ACID, PLASMA: Lactic Acid, Venous: 1.5 mmol/L (ref 0.5–1.9)

## 2022-03-26 NOTE — ED Notes (Signed)
First Nurse Note: Patient to ED via [facility] from home for increased weakness over the past week.   153/79 268 cbg 92 HR

## 2022-03-26 NOTE — ED Triage Notes (Signed)
Reports having diarrhea and weakness since today.  Recently here for sepsis and fall with hematoma to kidney and rib injury.

## 2022-03-27 ENCOUNTER — Emergency Department: Payer: Medicare Other

## 2022-03-27 ENCOUNTER — Emergency Department
Admission: EM | Admit: 2022-03-27 | Discharge: 2022-03-27 | Disposition: A | Discharge status: Home / Self Care | Payer: Medicare Other | Attending: Emergency Medicine | Admitting: Emergency Medicine

## 2022-03-27 DIAGNOSIS — J9601 Acute respiratory failure with hypoxia: Secondary | ICD-10-CM | POA: Diagnosis not present

## 2022-03-27 DIAGNOSIS — Z4781 Encounter for orthopedic aftercare following surgical amputation: Secondary | ICD-10-CM | POA: Diagnosis not present

## 2022-03-27 DIAGNOSIS — R531 Weakness: Secondary | ICD-10-CM | POA: Diagnosis not present

## 2022-03-27 DIAGNOSIS — M6281 Muscle weakness (generalized): Secondary | ICD-10-CM | POA: Diagnosis not present

## 2022-03-27 DIAGNOSIS — I509 Heart failure, unspecified: Secondary | ICD-10-CM

## 2022-03-27 DIAGNOSIS — R109 Unspecified abdominal pain: Secondary | ICD-10-CM

## 2022-03-27 DIAGNOSIS — R197 Diarrhea, unspecified: Secondary | ICD-10-CM | POA: Diagnosis not present

## 2022-03-27 LAB — URINALYSIS, ROUTINE W REFLEX MICROSCOPIC
Bacteria, UA: NONE SEEN
Bilirubin Urine: NEGATIVE
Glucose, UA: 150 mg/dL — AB
Hgb urine dipstick: NEGATIVE
Ketones, ur: 20 mg/dL — AB
Nitrite: NEGATIVE
Protein, ur: 100 mg/dL — AB
Specific Gravity, Urine: 1.014 (ref 1.005–1.030)
pH: 6 (ref 5.0–8.0)

## 2022-03-27 LAB — SARS CORONAVIRUS 2 BY RT PCR: SARS Coronavirus 2 by RT PCR: NEGATIVE

## 2022-03-27 MED ORDER — FUROSEMIDE 40 MG PO TABS
40.0000 mg | ORAL_TABLET | Freq: Once | ORAL | Status: AC
  Administered 2022-03-27: 40 mg via ORAL
  Filled 2022-03-27: qty 1

## 2022-03-27 NOTE — ED Provider Notes (Signed)
[facility] Provider Note    Event Date/Time   First MD Initiated Contact with Patient 03/27/22 [phone]     (approximate)   History   Abdominal Pain   HPI  [name]. is a 66 y.o. male past medical history of asthma COPD CHF diabetes history of left BKA BPH hypertension hypothyroidism A-fib who presents with abdominal pain.  Patient tells me that he has had off-and-on mid to lower abdominal pain for several months.  Says he has a history of ulcers.  Had pain yesterday but since coming to the emergency department this is resolved.  He did have some loose stools as well no blood in the stool or black stool.  Has had some nausea but no vomiting.  Currently feels better and was able to drink water in the ER and is not having any pain.  Denies shortness of breath fevers or chills.  Patient had recent admission for emphysematous pyelonephritis and septic shock.  During this admission he was found to have a perinephric hematoma until Eliquis is currently being held.     Past Medical History:  Diagnosis Date   Arrhythmia    atrial fibrillation   Asthma    CHF (congestive heart failure) (HCC)    COPD (chronic obstructive pulmonary disease) ([place])    Coronary artery disease    Depression    Diabetes mellitus without complication ([place])    Gout    History anabolic steroid use    Hyperlipidemia    Hypertension    Hypogonadism in male    Morbid obesity ([place])    Myocardial infarction ([place])    Peripheral vascular disease ([place])    Perirectal abscess    Pleurisy    Sleep apnea    CPAP at night, no oxygen   Varicella     Patient Active Problem List   Diagnosis Date Noted   Perinephric hematoma 03/20/2022   Bacteremia due to Klebsiella pneumoniae 03/19/2022   Hyperglycemia 03/16/2022   Septic shock ([place]) 03/16/2022   Sepsis secondary to UTI ([place]) 03/15/2022   Acute hypoxemic respiratory failure ([place]) 02/13/2022   Atrial fibrillation with rapid  ventricular response ([place]) 02/11/2022   Uncontrolled type 2 diabetes mellitus with hyperglycemia, with long-term current use of insulin ([place]) 02/11/2022   Dyslipidemia 02/11/2022   Hypothyroidism 02/11/2022   Benign prostatic hyperplasia with lower urinary tract symptoms 02/11/2022   Pressure injury of skin of heel 11/04/2021   UTI (urinary tract infection) 10/22/2021   Iron deficiency anemia 10/19/2021   Hyperkalemia 10/14/2021   Acute urinary retention 10/08/2021   Sepsis due to methicillin resistant Staphylococcus aureus (MRSA) ([place]) 10/04/2021   Acute delirium 10/04/2021   Acute on chronic respiratory failure with hypoxia and hypercapnia (HCC) 10/04/2021   Acute respiratory failure with hypoxia and hypercapnia (HCC) 10/03/2021   OSA (obstructive sleep apnea) 10/03/2021   Malnutrition of moderate degree 09/29/2021   Generalized weakness 09/29/2021   Cellulitis of left foot 08/04/2021   Uncontrolled type 2 diabetes mellitus with hypoglycemia, with long-term current use of insulin ([place]) 08/04/2021   Bilateral lower extremity edema 08/04/2021   Acute osteomyelitis of lower leg, left ([place]) 08/04/2021   Swelling of limb 07/18/2021   CHF exacerbation ([place]) 05/08/2021   Diabetes mellitus without complication (HCC)    Atrial fibrillation with RVR (HCC)    Obesity, Class III, BMI 40-49.9 (morbid obesity) ([place])    Venous stasis ulcer (HCC)    Sinus tachycardia    BPH (benign  prostatic hyperplasia)    Chronic anticoagulation    Anasarca    Acute on chronic diastolic CHF (congestive heart failure) (HCC)    Venous stasis of both lower extremities    AF (paroxysmal atrial fibrillation) ([place])    Lower extremity cellulitis 01/03/2021   Chronic osteomyelitis of left foot ([place]) 01/03/2021   Osteomyelitis ([place]) 01/03/2021   Severe sepsis ([place]) 02/24/2020   AKI (acute kidney injury) ([place]) 02/24/2020   Bilateral cellulitis of lower leg 02/23/2020   CAD (coronary artery disease) 02/02/2020    RLS (restless legs syndrome) 05/15/2019   Statin myopathy 05/15/2019   Diabetes mellitus type 2 in obese ([place]) 02/13/2016   Essential hypertension 02/13/2016   PAD (peripheral artery disease) ([place]) 02/13/2016   Pressure injury of skin 01/25/2016   Hypoglycemia associated with diabetes ([place]) 12/27/2013   Long-term insulin use ([place]) 12/27/2013   Microalbuminuria 12/27/2013   B12 deficiency 09/29/2013   Obesity 09/29/2013   CAD (coronary artery disease), autologous vein bypass graft 07/20/2013   Hypersomnia with sleep apnea 07/20/2013   Pure hypercholesterolemia 07/20/2013   Osteomyelitis of ankle or foot 07/20/2011     Physical Exam  Triage Vital Signs: ED Triage Vitals  Enc Vitals Group     BP 03/26/22 1642 (!) 149/74     Pulse Rate 03/26/22 1642 (!) 101     Resp 03/26/22 1642 15     Temp 03/26/22 1642 98.4 F (36.9 C)     Temp Source 03/26/22 1642 Oral     SpO2 03/26/22 1642 90 %     Weight 03/26/22 1711 (!) 310 lb (140.6 kg)     Height 03/26/22 1711 '6\' 4"'$  (1.93 m)     Head Circumference --      Peak Flow --      Pain Score 03/26/22 1711 4     Pain Loc --      Pain Edu? --      Excl. in [place]? --     Most recent vital signs: Vitals:   03/27/22 0600 03/27/22 0630  BP: 137/75 121/65  Pulse:    Resp:    Temp:    SpO2:       General: Awake, no distress.  CV:  Good peripheral perfusion.  Mild pitting edema of the right lower extremity, status post left BKA Resp:  Normal effort.  No increased work of breathing Abd:  No distention.  Obese abdomen, no focal tenderness no guarding abdomen is soft Neuro:             Awake, Alert, Oriented x 3  Other:     ED Results / Procedures / Treatments  Labs (all labs ordered are listed, but only abnormal results are displayed) Labs Reviewed  COMPREHENSIVE METABOLIC PANEL - Abnormal; Notable for the following components:      Result Value   Glucose, Bld 230 (*)    Calcium 8.6 (*)    Albumin 2.7 (*)    Alkaline Phosphatase  207 (*)    All other components within normal limits  CBC - Abnormal; Notable for the following components:   WBC 11.5 (*)    RBC 3.40 (*)    Hemoglobin 10.0 (*)    HCT 32.2 (*)    All other components within normal limits  URINALYSIS, ROUTINE W REFLEX MICROSCOPIC - Abnormal; Notable for the following components:   Color, Urine YELLOW (*)    APPearance CLEAR (*)    Glucose, UA 150 (*)    Ketones, ur  20 (*)    Protein, ur 100 (*)    Leukocytes,Ua TRACE (*)    All other components within normal limits  SARS CORONAVIRUS 2 BY RT PCR  LIPASE, BLOOD  LACTIC ACID, PLASMA     EKG  EKG reviewed interpreted myself shows sinus tach with right bundle branch block no acute schema changes   RADIOLOGY Chest x-ray reviewed interpreted myself shows mild pulmonary edema   PROCEDURES:  Critical Care performed: No  Procedures   MEDICATIONS ORDERED IN ED: Medications  furosemide (LASIX) tablet 40 mg (40 mg Oral Given 03/27/22 0600)     IMPRESSION / MDM / ASSESSMENT AND PLAN / ED COURSE  I reviewed the triage vital signs and the nursing notes.                              Patient's presentation is most consistent with acute complicated illness / injury requiring diagnostic workup.  Differential diagnosis includes, but is not limited to, GERD, dyspepsia, ulcer, diverticulitis pancreatitis hepatitis UTI pyelonephritis  The patient is a 66 year old male presenting because of abdominal pain.  The triage note said that patient had diarrhea and weakness.  Patient very much is downplaying his symptoms to me.  Tells me that he had some abdominal pain before but this has essentially resolved.  Tells me that it feels like pain he has had in the past that comes and goes tells me he was diagnosed with ulcers in the past thinks this could be related.  Typically it acts up after he eats something bad but he did not have any dietary indiscretion yesterday.  Patient is denying urinary symptoms fevers  chills.  Did have some diarrhea and nausea earlier but this is also resolved.  Patient had a recent admission for emphysematous pyelonephritis and a subcapsular renal hematoma.  His Eliquis was held.  On my evaluation on abdominal exam he is completely nontender.  His lab work is overall reassuring his hemoglobin is higher than it was during admission he does have a mild leukocytosis no AKI.  UA has 11-20 white cells but no bacteria and patient is not having urinary symptoms.  COVID test is negative.  Patient does have borderline sats on his baseline 2 L so did obtain a chest x-ray which shows findings consistent with mild CHF.  Patient has no complaint of chest pain or shortness of breath currently.  He does follow with Dary[facility].  Will give a dose of p.o. Lasix and I recommended he follow-up with Darylene Price.  Given the reassuring lab workup and the fact that he has no focal tenderness on exam and is essentially asymptomatic I think he is not require any further workup for the abdominal pain and he can be safely discharged.  We discussed return precautions.        FINAL CLINICAL IMPRESSION(S) / ED DIAGNOSES   Final diagnoses:  Congestive heart failure, unspecified HF chronicity, unspecified heart failure type ([place])  Abdominal pain, unspecified abdominal location     Rx / DC Orders   ED Discharge Orders     None        Note:  This document was prepared using Dragon voice recognition software and may include unintentional dictation errors.   Rada Hay, MD 03/27/22 [phone]

## 2022-03-27 NOTE — ED Notes (Signed)
Patient discharged to home per MD order. Patient in stable condition, and deemed medically cleared by ED provider for discharge. Discharge instructions reviewed with patient/family using "Teach Back"; verbalized understanding of medication education and administration, and information about follow-up care. Denies further concerns. ° °

## 2022-03-27 NOTE — Discharge Instructions (Addendum)
Your chest x-ray shows that you have mild heart failure exacerbation.  We gave you an extra dose of 40 mg of Lasix in the emergency department.  Please follow-up with [facility]  If you develop shortness of breath or chest pain please return to the emergency department.  Please limit your salt intake.

## 2022-03-27 NOTE — ED Notes (Signed)
Pt awaiting wife to pick up.

## 2022-03-29 DIAGNOSIS — I5031 Acute diastolic (congestive) heart failure: Secondary | ICD-10-CM | POA: Diagnosis not present

## 2022-03-29 DIAGNOSIS — A419 Sepsis, unspecified organism: Secondary | ICD-10-CM | POA: Diagnosis not present

## 2022-03-29 DIAGNOSIS — R6520 Severe sepsis without septic shock: Secondary | ICD-10-CM | POA: Diagnosis not present

## 2022-03-29 DIAGNOSIS — Z79899 Other long term (current) drug therapy: Secondary | ICD-10-CM | POA: Diagnosis not present

## 2022-03-29 DIAGNOSIS — Z8619 Personal history of other infectious and parasitic diseases: Secondary | ICD-10-CM | POA: Diagnosis not present

## 2022-03-29 DIAGNOSIS — I509 Heart failure, unspecified: Secondary | ICD-10-CM | POA: Diagnosis not present

## 2022-03-29 LAB — MISC LABCORP TEST (SEND OUT): Labcorp test code: 9985

## 2022-04-02 ENCOUNTER — Ambulatory Visit
Admission: RE | Admit: 2022-04-02 | Discharge: 2022-04-02 | Disposition: A | Discharge status: Home / Self Care | Payer: Medicare Other | Source: Ambulatory Visit | Attending: Physician Assistant | Admitting: Physician Assistant

## 2022-04-02 DIAGNOSIS — S37019A Minor contusion of unspecified kidney, initial encounter: Secondary | ICD-10-CM | POA: Diagnosis not present

## 2022-04-02 DIAGNOSIS — K5730 Diverticulosis of large intestine without perforation or abscess without bleeding: Secondary | ICD-10-CM | POA: Diagnosis not present

## 2022-04-02 DIAGNOSIS — I700 Atherosclerosis of aorta: Secondary | ICD-10-CM | POA: Diagnosis not present

## 2022-04-02 DIAGNOSIS — X58XXXA Exposure to other specified factors, initial encounter: Secondary | ICD-10-CM | POA: Insufficient documentation

## 2022-04-02 DIAGNOSIS — K661 Hemoperitoneum: Secondary | ICD-10-CM | POA: Diagnosis not present

## 2022-04-02 DIAGNOSIS — K683 Retroperitoneal hematoma: Secondary | ICD-10-CM | POA: Diagnosis not present

## 2022-04-02 MED ORDER — IOHEXOL 300 MG/ML  SOLN
100.0000 mL | Freq: Once | INTRAMUSCULAR | Status: AC | PRN
  Administered 2022-04-02: 100 mL via INTRAVENOUS

## 2022-04-03 ENCOUNTER — Other Ambulatory Visit: Payer: Self-pay | Admitting: Internal Medicine

## 2022-04-03 DIAGNOSIS — I480 Paroxysmal atrial fibrillation: Secondary | ICD-10-CM

## 2022-04-03 DIAGNOSIS — S37011D Minor contusion of right kidney, subsequent encounter: Secondary | ICD-10-CM

## 2022-04-04 ENCOUNTER — Ambulatory Visit (INDEPENDENT_AMBULATORY_CARE_PROVIDER_SITE_OTHER): Payer: Medicare Other | Admitting: Urology

## 2022-04-04 ENCOUNTER — Encounter: Payer: Self-pay | Admitting: Urology

## 2022-04-04 ENCOUNTER — Ambulatory Visit: Payer: Medicare Other

## 2022-04-04 VITALS — BP 135/68 | HR 96 | Ht 76.0 in | Wt 310.0 lb

## 2022-04-04 DIAGNOSIS — S37019A Minor contusion of unspecified kidney, initial encounter: Secondary | ICD-10-CM

## 2022-04-04 NOTE — Progress Notes (Signed)
04/04/2022 5:13 PM   [name] 04/29/1956 [id number]  Referring provider: [name], MD [facility] [place],  [place] [address]  Chief Complaint  Patient presents with   New Patient (Initial Visit)    HPI: Extremely comorbid 66 year old male who presents today for hospital follow-up.  Initially presented as floridly septic to the ICU requiring pressors found to have right-sided perinephric hematoma with possible/probable emphysematous pyelonephritis.  He was managed conservatively and ultimately improved.  He received 1 week of IV antibiotics for Klebsiella bacteremia which was found within his blood in urine.  His hospital course was complicated by chronic hypoxia and respiratory failure on chronic O2, moderate right pleural effusion status post leftthoracentesis acute renal failure with return and his renal function back to baseline prior to discharge, paroxysmal A-fib with Eliquis being held for large perinephric bleeding.  He subsequently seen in follow-up by his PCP, Dr. Doy Hutching.  He had labs indicate creatinine of 1.3.  Urinalysis did show 96 white blood cells and 9 red blood cells.  Urine culture was however negative.  He had no leukocytosis with a WBC of 9.4, hemoglobin of 9.2 on 03/29/2022.  He follows up today with another serial CT scan to reassess the hematoma/perinephric air.  He underwent CT abdomen pelvis on 04/03/2022 which showed persistent right renal subcapsular hematoma extending to the right perinephric/retroperitoneal space is also minimally increased in size including slight increase in the subcapsular component now measuring 5.2 x 9.5 previously 9.1 x 4.0 centimeters.  Persistent scattered air pockets are also seen presumably within the collecting system although.  He mentions today that he believes that the hematoma was provoked.  He to follow after trying to lift himself using a Hoyer from nearly ceiling height down  to the ground in the setting of anticoagulation.  He does endorse some pain over his right iliac bone where he believes he hit the ground.  He denies any flank pain or gross hematuria.  PMH: Past Medical History:  Diagnosis Date   Arrhythmia    atrial fibrillation   Asthma    CHF (congestive heart failure) (HCC)    COPD (chronic obstructive pulmonary disease) (HCC)    Coronary artery disease    Depression    Diabetes mellitus without complication (HCC)    Gout    History anabolic steroid use    Hyperlipidemia    Hypertension    Hypogonadism in male    Morbid obesity ([place])    Myocardial infarction ([place])    Peripheral vascular disease ([place])    Perirectal abscess    Pleurisy    Sleep apnea    CPAP at night, no oxygen   Varicella     Surgical History: Past Surgical History:  Procedure Laterality Date   ABDOMINAL AORTIC ANEURYSM REPAIR     ACHILLES TENDON SURGERY Left 01/10/2021   Procedure: ACHILLES LENGTHENING/KIDNER;  Surgeon: Caroline More, DPM;  Location: [facility];  Service: Podiatry;  Laterality: Left;   AMPUTATION Left 10/03/2021   Procedure: AMPUTATION BELOW KNEE;  Surgeon: Katha Cabal, MD;  Location: [facility];  Service: Vascular;  Laterality: Left;   AMPUTATION TOE Right 02/10/2016   Procedure: AMPUTATION TOE 3RD TOE;  Surgeon: Samara Deist, DPM;  Location: [facility];  Service: Podiatry;  Laterality: Right;   AMPUTATION TOE Left 02/24/2020   Procedure: AMPUTATION TOE;  Surgeon: Caroline More, DPM;  Location: [facility];  Service: Podiatry;  Laterality: Left;   APPLICATION OF WOUND  VAC Left 02/29/2020   Procedure: APPLICATION OF WOUND VAC;  Surgeon: Caroline More, DPM;  Location: [facility];  Service: Podiatry;  Laterality: Left;   CARDIOVERSION [name] 02/13/2022   Procedure: CARDIOVERSION;  Surgeon: Corey Skains, MD;  Location: [facility];  Service: Cardiovascular;  Laterality: [name];   COLONOSCOPY WITH PROPOFOL [name] 11/18/2015   Procedure: COLONOSCOPY WITH PROPOFOL;   Surgeon: Manya Silvas, MD;  Location: [facility];  Service: Endoscopy;  Laterality: [name];   CORONARY ARTERY BYPASS GRAFT     CORONARY STENT INTERVENTION [name] 02/02/2020   Procedure: CORONARY STENT INTERVENTION;  Surgeon: Isaias Cowman, MD;  Location: [place] [facility];  Service: Cardiovascular;  Laterality: [name];   INCISION AND DRAINAGE Left 08/07/2021   Procedure: INCISION AND DRAINAGE-Partial Calcanectomy;  Surgeon: Caroline More, DPM;  Location: [facility];  Service: Podiatry;  Laterality: Left;   IRRIGATION AND DEBRIDEMENT FOOT Left 02/29/2020   Procedure: IRRIGATION AND DEBRIDEMENT FOOT;  Surgeon: Caroline More, DPM;  Location: [facility];  Service: Podiatry;  Laterality: Left;   IRRIGATION AND DEBRIDEMENT FOOT Left 02/24/2020   Procedure: IRRIGATION AND DEBRIDEMENT FOOT;  Surgeon: Caroline More, DPM;  Location: [facility];  Service: Podiatry;  Laterality: Left;   KNEE ARTHROSCOPY     LEFT HEART CATH AND CORS/GRAFTS ANGIOGRAPHY [name] 02/02/2020   Procedure: LEFT HEART CATH AND CORS/GRAFTS ANGIOGRAPHY;  Surgeon: Teodoro Spray, MD;  Location: [place] [facility];  Service: Cardiovascular;  Laterality: [name];   LOWER EXTREMITY ANGIOGRAPHY Left 02/25/2020   Procedure: Lower Extremity Angiography;  Surgeon: Algernon Huxley, MD;  Location: [place] [facility];  Service: Cardiovascular;  Laterality: Left;   LOWER EXTREMITY ANGIOGRAPHY Left 01/04/2021   Procedure: LOWER EXTREMITY ANGIOGRAPHY;  Surgeon: Algernon Huxley, MD;  Location: [place] [facility];  Service: Cardiovascular;  Laterality: Left;   METATARSAL HEAD EXCISION Left 01/10/2021   Procedure: METATARSAL HEAD EXCISION - LEFT 5th;  Surgeon: Caroline More, DPM;  Location: [facility];  Service: Podiatry;  Laterality: Left;   PERIPHERAL VASCULAR CATHETERIZATION Right 01/24/2016   Procedure: Lower Extremity Angiography;  Surgeon: Katha Cabal, MD;  Location: [place] [facility];  Service: Cardiovascular;  Laterality: Right;   PERIPHERAL  VASCULAR CATHETERIZATION Right 01/25/2016   Procedure: Lower Extremity Angiography;  Surgeon: Katha Cabal, MD;  Location: [place] [facility];  Service: Cardiovascular;  Laterality: Right;   TOE AMPUTATION     TONSILLECTOMY      Home Medications:  Allergies as of 04/04/2022       Reactions   Penicillins Other (See Comments)   Happened at 66 years old and pt. stated he passed out  He has tolerated amoxicillin/clavulanate and ampicillin/sulbactam   Statins    Other reaction(s): Muscle Pain Causes legs to ache per pt   Metformin And Related Diarrhea        Medication List        Accurate as of April 04, 2022 11:59 PM. If you have any questions, ask your nurse or doctor.          acetaminophen 325 MG tablet Commonly known as: TYLENOL Take 2 tablets (650 mg total) by mouth every 6 (six) hours as needed for mild pain (or Fever >/= 101).   amiodarone 200 MG tablet Commonly known as: PACERONE Take 200 mg by mouth daily.   ascorbic acid 500 MG tablet Commonly known as: VITAMIN C Take 500 mg by mouth daily.   budesonide 0.5 MG/2ML nebulizer solution Commonly known as: PULMICORT Take  2 mLs (0.5 mg total) by nebulization 2 (two) times daily.   Cholecalciferol 25 MCG (1000 UT) tablet Take 1,000 Units by mouth daily.   diltiazem 120 MG 12 hr capsule Commonly known as: CARDIZEM SR Take 1 capsule (120 mg total) by mouth every 12 (twelve) hours.   ezetimibe 10 MG tablet Commonly known as: ZETIA Take 10 mg by mouth at bedtime.   ferrous sulfate 325 (65 FE) MG tablet Take 325 mg by mouth daily with breakfast.   finasteride 5 MG tablet Commonly known as: PROSCAR Take 1 tablet (5 mg total) by mouth daily.   furosemide 20 MG tablet Commonly known as: LASIX Take 2 tablets (40 mg total) by mouth daily.   HumaLOG 100 UNIT/ML injection Generic drug: insulin lispro Inject 15 Units into the skin 3 (three) times daily with meals. >200   insulin glargine-yfgn 100  UNIT/ML injection Commonly known as: SEMGLEE Inject 0.25 mLs (25 Units total) into the skin at bedtime. What changed: how much to take   levothyroxine 25 MCG tablet Commonly known as: SYNTHROID Take 1 tablet (25 mcg total) by mouth daily at 6 (six) AM.   multivitamin with minerals tablet Take 1 tablet by mouth daily.   primidone 250 MG tablet Commonly known as: MYSOLINE Take 250 mg by mouth 2 (two) times daily.   risperiDONE 0.5 MG tablet Commonly known as: RISPERDAL Take 1 tablet (0.5 mg total) by mouth 2 (two) times daily.   tamsulosin 0.4 MG Caps capsule Commonly known as: FLOMAX Take 0.4 mg by mouth daily.   traMADol 50 MG tablet Commonly known as: ULTRAM Take 1 tablet (50 mg total) by mouth daily as needed for moderate pain.   Vitamin B-12 5000 MCG Tbdp Take 10,000 mcg by mouth daily.        Allergies:  Allergies  Allergen Reactions   Penicillins Other (See Comments)    Happened at 66 years old and pt. stated he passed out  He has tolerated amoxicillin/clavulanate and ampicillin/sulbactam   Statins     Other reaction(s): Muscle Pain Causes legs to ache per pt   Metformin And Related Diarrhea    Family History: Family History  Problem Relation Age of Onset   Hypertension Father    Coronary artery disease Father    Alcohol abuse Father    Heart failure Brother     Social History:  reports that he quit smoking about 7 years ago. His smoking use included cigarettes. He has a 22.50 pack-year smoking history. He has never used smokeless tobacco. He reports that he does not currently use alcohol after a past usage of about 3.0 standard drinks of alcohol per week. He reports that he does not use drugs.   Physical Exam: BP 135/68   Pulse 96   Ht '6\' 4"'$  (1.93 m)   Wt (!) 310 lb (140.6 kg)   BMI 37.73 kg/m   Constitutional:  Alert and oriented, No acute distress.  In wheelchair wearing oxygen.  Lower extremity absent. Respiratory: Normal respiratory effort,  no increased work of breathing. GU: Flank examined, no hematoma.  No CVA tenderness. Neurologic: Grossly intact, no focal deficits, moving all 4 extremities. Psychiatric: Normal mood and affect.  Laboratory Data: Lab Results  Component Value Date   WBC 11.5 (H) 03/26/2022   HGB 10.0 (L) 03/26/2022   HCT 32.2 (L) 03/26/2022   MCV 94.7 03/26/2022   PLT 315 03/26/2022    Lab Results  Component Value Date   CREATININE 0.99  03/26/2022   Lab Results  Component Value Date   HGBA1C 8.9 (H) 03/15/2022   Pertinent Imaging:  IMPRESSION: 1. Similar or minimally increased in the size of the right renal subcapsular hematoma extending into the inferior right pararenal space. There is similar or slightly increased mass effect on the right kidney with slight decreased enhancement of the right renal parenchyma suggestive of decreased perfusion. 2. Colonic diverticulosis. No bowel obstruction. 3.  Aortic Atherosclerosis (ICD10-I70.0).     Electronically Signed   By: Anner Crete M.D.   On: 04/03/2022 20:27  The CT scan images above were personally reviewed, agree with radiologic interpretation and is is also compared to serial images.  Assessment & Plan:    1. Perinephric hematoma Large fairly stable although slightly larger perinephric hematoma, likely provoked in the setting of anticoagulation  Given that the hematoma has enlarged slightly since his last interval imaging rather than involuted, hesitant to resume Eliquis at this time.  He understands and agrees.  Will plan to reassess in 6 weeks.  In the meantime, warning symptoms reviewed in detail.  Interestingly, he only received 1 week of IV antibiotics for his bacteremia although at this point in time, he remains afebrile, no leukocytosis or clinical evidence of systemic infection, as such we will hold off on further antibiotics for the time being although again signs and symptoms of UTI and systemic infection were reviewed.   Most recent urine culture was also negative which is reassuring.  There is no role for intervention at this time and continue to plan to manage him conservatively in light of comorbidities and clinical stability. - Urinalysis, Complete - CT Abdomen Pelvis W Contrast; Future    Return in about 6 weeks (around 05/16/2022) for after CT.  Hollice Espy, MD  [facility] [address] [place], [place] [address] [phone]  I spent 35 total minutes on the day of the encounter including pre-visit review of the medical record, face-to-face time with the patient, and post visit ordering of labs/imaging/tests.

## 2022-04-04 NOTE — Patient Instructions (Signed)
Continue to hold eliquis until follow up

## 2022-04-05 ENCOUNTER — Encounter: Payer: Self-pay | Admitting: Family

## 2022-04-05 ENCOUNTER — Ambulatory Visit: Payer: Medicare Other | Attending: Family | Admitting: Family

## 2022-04-05 VITALS — BP 133/65 | HR 98 | Resp 20 | Wt 303.0 lb

## 2022-04-05 DIAGNOSIS — G4730 Sleep apnea, unspecified: Secondary | ICD-10-CM | POA: Diagnosis not present

## 2022-04-05 DIAGNOSIS — Z87891 Personal history of nicotine dependence: Secondary | ICD-10-CM | POA: Diagnosis not present

## 2022-04-05 DIAGNOSIS — E1151 Type 2 diabetes mellitus with diabetic peripheral angiopathy without gangrene: Secondary | ICD-10-CM

## 2022-04-05 DIAGNOSIS — J449 Chronic obstructive pulmonary disease, unspecified: Secondary | ICD-10-CM | POA: Insufficient documentation

## 2022-04-05 DIAGNOSIS — E1169 Type 2 diabetes mellitus with other specified complication: Secondary | ICD-10-CM | POA: Diagnosis not present

## 2022-04-05 DIAGNOSIS — Z794 Long term (current) use of insulin: Secondary | ICD-10-CM | POA: Diagnosis not present

## 2022-04-05 DIAGNOSIS — F329 Major depressive disorder, single episode, unspecified: Secondary | ICD-10-CM | POA: Diagnosis not present

## 2022-04-05 DIAGNOSIS — I2510 Atherosclerotic heart disease of native coronary artery without angina pectoris: Secondary | ICD-10-CM | POA: Diagnosis not present

## 2022-04-05 DIAGNOSIS — R5383 Other fatigue: Secondary | ICD-10-CM | POA: Diagnosis not present

## 2022-04-05 DIAGNOSIS — E785 Hyperlipidemia, unspecified: Secondary | ICD-10-CM | POA: Insufficient documentation

## 2022-04-05 DIAGNOSIS — I480 Paroxysmal atrial fibrillation: Secondary | ICD-10-CM | POA: Diagnosis not present

## 2022-04-05 DIAGNOSIS — E1142 Type 2 diabetes mellitus with diabetic polyneuropathy: Secondary | ICD-10-CM | POA: Diagnosis not present

## 2022-04-05 DIAGNOSIS — I10 Essential (primary) hypertension: Secondary | ICD-10-CM | POA: Diagnosis not present

## 2022-04-05 DIAGNOSIS — M109 Gout, unspecified: Secondary | ICD-10-CM | POA: Insufficient documentation

## 2022-04-05 DIAGNOSIS — Z89512 Acquired absence of left leg below knee: Secondary | ICD-10-CM | POA: Insufficient documentation

## 2022-04-05 DIAGNOSIS — I5032 Chronic diastolic (congestive) heart failure: Secondary | ICD-10-CM

## 2022-04-05 DIAGNOSIS — J45909 Unspecified asthma, uncomplicated: Secondary | ICD-10-CM | POA: Diagnosis not present

## 2022-04-05 DIAGNOSIS — I890 Lymphedema, not elsewhere classified: Secondary | ICD-10-CM | POA: Diagnosis not present

## 2022-04-05 DIAGNOSIS — I110 Hypertensive heart disease with heart failure: Secondary | ICD-10-CM | POA: Diagnosis not present

## 2022-04-05 DIAGNOSIS — I4891 Unspecified atrial fibrillation: Secondary | ICD-10-CM | POA: Diagnosis not present

## 2022-04-05 DIAGNOSIS — Z9981 Dependence on supplemental oxygen: Secondary | ICD-10-CM | POA: Insufficient documentation

## 2022-04-05 DIAGNOSIS — E1159 Type 2 diabetes mellitus with other circulatory complications: Secondary | ICD-10-CM | POA: Diagnosis not present

## 2022-04-05 DIAGNOSIS — I739 Peripheral vascular disease, unspecified: Secondary | ICD-10-CM

## 2022-04-05 NOTE — Progress Notes (Signed)
Patient ID: [name], male    DOB: 01-16-1956, 66 y.o.   MRN: [id number]   Mr Lua is a 66 y/o male with a history of atrial fibrillation, asthma, CAD, DM, hyperlipidemia, HTN, depression, gout, hypogonadism, PVD, COPD, pleurisy, sleep apnea, previous tobacco use and chronic heart failure.   Echo report from 10/05/21 reviewed and showed an EF of 55-60% along with mild LAE and mild MR. Echo report from 05/10/21 reviewed and showed an EF of 50-55% along with moderate LVH  LHC done 02/02/20 and showed: Mid RCA lesion is 90% stenosed. Prox LAD to Mid LAD lesion is 100% stenosed. Mid Cx lesion is 100% stenosed. Origin to Prox Graft lesion is 100% stenosed.  LM normal LAD-100% proxiimal LCx-100% mid RCA-90% mid  Was in the ED 03/27/22 due to abdominal pain. Dose of oral lasix given after CXR showed mild fluid. Admitted 09/28/21 due to bilateral lower extremity swelling, increased redness and swelling involving the left lower extremity mostly over the heel due to left heel osteomyelitis. Wound, podiatry, cardiology, vascular, ID, palliative care and psych consults obtained. Underwent left BKA. IV lasix resumed. Need bipap or high flow oxygen post surgery. PICC line removed. PT/OT evaluations done. Discharged after 46 days to SNF.   He presents today for a follow up visit after recent ED visit with a chief complaint of minimal fatigue with moderate exertion. Describes this as chronic in nature. He has associated easy bruising and chronic difficulty sleeping along with this. He denies any dizziness, abdominal distention, palpitations, pedal edema, chest pain, shortness of breath or cough.   Not weighing at home because of his instability and doesn't feel comfortable getting up to weigh today in the office. Reports last known weight was 303 pounds at PCP office. Getting PT/OT numerous times/ week to try and get his strength in his right leg built up where he can stand and support himself so  that he can then get the prosthesis for his left leg.   Now wearing oxygen at 2L around the clock.   Past Medical History:  Diagnosis Date   Arrhythmia    atrial fibrillation   Asthma    CHF (congestive heart failure) (HCC)    COPD (chronic obstructive pulmonary disease) (HCC)    Coronary artery disease    Depression    Diabetes mellitus without complication (HCC)    Gout    History anabolic steroid use    Hyperlipidemia    Hypertension    Hypogonadism in male    Morbid obesity ([place])    Myocardial infarction ([place])    Peripheral vascular disease ([place])    Perirectal abscess    Pleurisy    Sleep apnea    CPAP at night, no oxygen   Varicella    Past Surgical History:  Procedure Laterality Date   ABDOMINAL AORTIC ANEURYSM REPAIR     ACHILLES TENDON SURGERY Left 01/10/2021   Procedure: ACHILLES LENGTHENING/KIDNER;  Surgeon: Caroline More, DPM;  Location: [facility];  Service: Podiatry;  Laterality: Left;   AMPUTATION Left 10/03/2021   Procedure: AMPUTATION BELOW KNEE;  Surgeon: Katha Cabal, MD;  Location: [facility];  Service: Vascular;  Laterality: Left;   AMPUTATION TOE Right 02/10/2016   Procedure: AMPUTATION TOE 3RD TOE;  Surgeon: Samara Deist, DPM;  Location: [facility];  Service: Podiatry;  Laterality: Right;   AMPUTATION TOE Left 02/24/2020   Procedure: AMPUTATION TOE;  Surgeon: Caroline More, DPM;  Location: [facility];  Service: Podiatry;  Laterality: Left;   APPLICATION OF WOUND VAC Left 02/29/2020   Procedure: APPLICATION OF WOUND VAC;  Surgeon: Caroline More, DPM;  Location: [facility];  Service: Podiatry;  Laterality: Left;   CARDIOVERSION [name] 02/13/2022   Procedure: CARDIOVERSION;  Surgeon: Corey Skains, MD;  Location: [facility];  Service: Cardiovascular;  Laterality: [name];   COLONOSCOPY WITH PROPOFOL [name] 11/18/2015   Procedure: COLONOSCOPY WITH PROPOFOL;  Surgeon: Manya Silvas, MD;  Location: [facility] END[facility]COPY;  Service: Endoscopy;  Laterality: [name];   CORONARY  ARTERY BYPASS GRAFT     CORONARY STENT INTERVENTION [name] 02/02/2020   Procedure: CORONARY STENT INTERVENTION;  Surgeon: Isaias Cowman, MD;  Location: [place] [facility];  Service: Cardiovascular;  Laterality: [name];   INCISION AND DRAINAGE Left 08/07/2021   Procedure: INCISION AND DRAINAGE-Partial Calcanectomy;  Surgeon: Caroline More, DPM;  Location: [facility];  Service: Podiatry;  Laterality: Left;   IRRIGATION AND DEBRIDEMENT FOOT Left 02/29/2020   Procedure: IRRIGATION AND DEBRIDEMENT FOOT;  Surgeon: Caroline More, DPM;  Location: [facility];  Service: Podiatry;  Laterality: Left;   IRRIGATION AND DEBRIDEMENT FOOT Left 02/24/2020   Procedure: IRRIGATION AND DEBRIDEMENT FOOT;  Surgeon: Caroline More, DPM;  Location: [facility];  Service: Podiatry;  Laterality: Left;   KNEE ARTHROSCOPY     LEFT HEART CATH AND CORS/GRAFTS ANGIOGRAPHY [name] 02/02/2020   Procedure: LEFT HEART CATH AND CORS/GRAFTS ANGIOGRAPHY;  Surgeon: Teodoro Spray, MD;  Location: [place] [facility];  Service: Cardiovascular;  Laterality: [name];   LOWER EXTREMITY ANGIOGRAPHY Left 02/25/2020   Procedure: Lower Extremity Angiography;  Surgeon: Algernon Huxley, MD;  Location: [place] [facility];  Service: Cardiovascular;  Laterality: Left;   LOWER EXTREMITY ANGIOGRAPHY Left 01/04/2021   Procedure: LOWER EXTREMITY ANGIOGRAPHY;  Surgeon: Algernon Huxley, MD;  Location: [place] [facility];  Service: Cardiovascular;  Laterality: Left;   METATARSAL HEAD EXCISION Left 01/10/2021   Procedure: METATARSAL HEAD EXCISION - LEFT 5th;  Surgeon: Caroline More, DPM;  Location: [facility];  Service: Podiatry;  Laterality: Left;   PERIPHERAL VASCULAR CATHETERIZATION Right 01/24/2016   Procedure: Lower Extremity Angiography;  Surgeon: Katha Cabal, MD;  Location: [place] [facility];  Service: Cardiovascular;  Laterality: Right;   PERIPHERAL VASCULAR CATHETERIZATION Right 01/25/2016   Procedure: Lower Extremity Angiography;  Surgeon: Katha Cabal,  MD;  Location: [place] [facility];  Service: Cardiovascular;  Laterality: Right;   TOE AMPUTATION     TONSILLECTOMY     Family History  Problem Relation Age of Onset   Hypertension Father    Coronary artery disease Father    Alcohol abuse Father    Heart failure Brother    Social History   Tobacco Use   Smoking status: Former    Packs/day: 0.50    Years: 45.00    Total pack years: 22.50    Types: Cigarettes    Quit date: 04/05/2015    Years since quitting: 7.0   Smokeless tobacco: Never  Substance Use Topics   Alcohol use: Not Currently    Alcohol/week: 3.0 standard drinks of alcohol    Types: 3 Glasses of wine per week   Allergies  Allergen Reactions   Penicillins Other (See Comments)    Happened at 66 years old and pt. stated he passed out  He has tolerated amoxicillin/clavulanate and ampicillin/sulbactam   Statins     Other reaction(s): Muscle Pain Causes legs to ache per pt   Metformin And Related Diarrhea   Prior to  Admission medications   Medication Sig Start Date End Date Taking? Authorizing Provider  acetaminophen (TYLENOL) 325 MG tablet Take 2 tablets (650 mg total) by mouth every 6 (six) hours as needed for mild pain (or Fever >/= 101). 11/10/21  Yes Emeterio Reeve, DO  amiodarone (PACERONE) 200 MG tablet Take 200 mg by mouth daily.   Yes [provider]  budesonide (PULMICORT) 0.5 MG/2ML nebulizer solution Take 2 mLs (0.5 mg total) by nebulization 2 (two) times daily. 11/10/21  Yes Emeterio Reeve, DO  Cholecalciferol 25 MCG (1000 UT) tablet Take 1,000 Units by mouth daily.   Yes [provider]  Cyanocobalamin (VITAMIN B-12) 5000 MCG TBDP Take 10,000 mcg by mouth daily.   Yes [provider]  diltiazem (CARDIZEM SR) 120 MG 12 hr capsule Take 1 capsule (120 mg total) by mouth every 12 (twelve) hours. 11/10/21  Yes Emeterio Reeve, DO  ezetimibe (ZETIA) 10 MG tablet Take 10 mg by mouth at bedtime.   Yes [provider]   ferrous sulfate 325 (65 FE) MG tablet Take 325 mg by mouth daily with breakfast.   Yes [provider]  finasteride (PROSCAR) 5 MG tablet Take 1 tablet (5 mg total) by mouth daily. 11/11/21  Yes Emeterio Reeve, DO  furosemide (LASIX) 20 MG tablet Take 2 tablets (40 mg total) by mouth daily. 02/15/22  Yes Fritzi Mandes, MD  HUMALOG 100 UNIT/ML injection Inject 15 Units into the skin 3 (three) times daily with meals. >200 02/05/22  Yes [provider]  insulin glargine-yfgn (SEMGLEE) 100 UNIT/ML injection Inject 0.25 mLs (25 Units total) into the skin at bedtime. Patient taking differently: Inject 0-25 Units into the skin every morning. 11/10/21  Yes Emeterio Reeve, DO  levothyroxine (SYNTHROID) 25 MCG tablet Take 1 tablet (25 mcg total) by mouth daily at 6 (six) AM. 11/11/21  Yes Emeterio Reeve, DO  Multiple Vitamins-Minerals (MULTIVITAMIN WITH MINERALS) tablet Take 1 tablet by mouth daily.   Yes [provider]  primidone (MYSOLINE) 250 MG tablet Take 250 mg by mouth 2 (two) times daily. 01/23/20  Yes [provider]  risperiDONE (RISPERDAL) 0.5 MG tablet Take 1 tablet (0.5 mg total) by mouth 2 (two) times daily. 11/10/21  Yes Emeterio Reeve, DO  tamsulosin (FLOMAX) 0.4 MG CAPS capsule Take 0.4 mg by mouth daily.   Yes [provider]  traMADol (ULTRAM) 50 MG tablet Take 1 tablet (50 mg total) by mouth daily as needed for moderate pain. 08/11/21  Yes Fritzi Mandes, MD  vitamin C (ASCORBIC ACID) 500 MG tablet Take 500 mg by mouth daily.   Yes [provider]   Review of Systems  Constitutional:  Positive for fatigue. Negative for appetite change.  HENT:  Negative for congestion, postnasal drip and rhinorrhea.   Eyes: Negative.   Respiratory:  Negative for cough and shortness of breath.   Cardiovascular:  Negative for chest pain, palpitations and leg swelling.  Gastrointestinal:  Negative for abdominal distention and abdominal pain.   Endocrine: Negative.   Genitourinary: Negative.   Musculoskeletal:  Negative for arthralgias and back pain.  Skin: Negative.   Allergic/Immunologic: Negative.   Neurological:  Negative for dizziness, tremors and light-headedness.  Hematological:  Negative for adenopathy. Bruises/bleeds easily.  Psychiatric/Behavioral:  Negative for dysphoric mood and sleep disturbance (wearing oxygen @ 2L; CPAP inconsistently). The patient is not nervous/anxious.    Vitals:   04/05/22 1548  BP: 133/65  Pulse: 98  Resp: 20  SpO2: 98%  Weight: Marland Kitchen)  303 lb (137.4 kg)   Wt Readings from Last 3 Encounters:  04/05/22 (!) 303 lb (137.4 kg)  04/04/22 (!) 310 lb (140.6 kg)  03/26/22 (!) 310 lb (140.6 kg)   Lab Results  Component Value Date   CREATININE 0.99 03/26/2022   CREATININE 1.24 03/21/2022   CREATININE 1.31 (H) 03/20/2022   Physical Exam Vitals and nursing note reviewed.  Constitutional:      Appearance: Normal appearance. He is obese.  HENT:     Head: Normocephalic and atraumatic.  Cardiovascular:     Rate and Rhythm: Normal rate and regular rhythm.  Pulmonary:     Effort: No respiratory distress.     Breath sounds: No wheezing or rales.  Abdominal:     General: There is no distension.     Palpations: Abdomen is soft.     Tenderness: There is no abdominal tenderness.  Musculoskeletal:        General: Deformity (left BKA) present.     Cervical back: Normal range of motion.     Right lower leg: No edema.  Skin:    General: Skin is warm and dry.  Neurological:     General: No focal deficit present.     Mental Status: He is alert and oriented to person, place, and time.  Psychiatric:        Mood and Affect: Mood normal.        Behavior: Behavior normal.        Thought Content: Thought content normal.   Assessment & Plan:  1: Chronic heart failure with preserved ejection fraction with structural changes (LAE)- - NYHA class II - euvolemic today - currently not weighing daily  because he can't support himself on his right leg yet - receiving PT/ OT multiple times a week - may not be a good candidate for SGLT2 due to body habitus and frequent fungal infections around abdominal folds and right lower leg - consider starting entresto at future visit - now wearing oxygen at 2L around the clock; wearing CPAP inconsistently - not adding any salt to his food  - BNP 03/16/22 was 205.4  2: HTN- - BP looks good (133/65) - saw PCP ([place]) 03/29/22 - BMP 03/29/22 reviewed and showed sodium 138, potassium 4.2, creatinine 1.3 & GFR 60  3: DM- - saw endocrinology Honor Junes) earlier today - A1c 03/15/22 was 8.9%  4: Atrial fibrillation- - saw cardiology ([facility]) 02/28/22; getting established with [facility] cardiology Fletcher Anon) on 05/15/22 - regular rhythm today  5: PAD/ lymphedema- - had left BKA June 2023 - sees vascular Owens Shark) 04/18/22 - getting PT/ OT services at home now   Medication list reviewed.   Return in 6 weeks, sooner if needed.

## 2022-04-05 NOTE — Patient Instructions (Signed)
If you have voicemail, please make sure your mailbox is cleaned out so that we may leave a message and please make sure to listen to any voicemails.

## 2022-04-10 DIAGNOSIS — I480 Paroxysmal atrial fibrillation: Secondary | ICD-10-CM | POA: Diagnosis not present

## 2022-04-10 DIAGNOSIS — I130 Hypertensive heart and chronic kidney disease with heart failure and stage 1 through stage 4 chronic kidney disease, or unspecified chronic kidney disease: Secondary | ICD-10-CM | POA: Diagnosis not present

## 2022-04-10 DIAGNOSIS — E1122 Type 2 diabetes mellitus with diabetic chronic kidney disease: Secondary | ICD-10-CM | POA: Diagnosis not present

## 2022-04-10 DIAGNOSIS — J4489 Other specified chronic obstructive pulmonary disease: Secondary | ICD-10-CM | POA: Diagnosis not present

## 2022-04-10 DIAGNOSIS — J9611 Chronic respiratory failure with hypoxia: Secondary | ICD-10-CM | POA: Diagnosis not present

## 2022-04-10 DIAGNOSIS — F339 Major depressive disorder, recurrent, unspecified: Secondary | ICD-10-CM | POA: Diagnosis not present

## 2022-04-10 DIAGNOSIS — I2510 Atherosclerotic heart disease of native coronary artery without angina pectoris: Secondary | ICD-10-CM | POA: Diagnosis not present

## 2022-04-10 DIAGNOSIS — I5032 Chronic diastolic (congestive) heart failure: Secondary | ICD-10-CM | POA: Diagnosis not present

## 2022-04-10 DIAGNOSIS — N39498 Other specified urinary incontinence: Secondary | ICD-10-CM | POA: Diagnosis not present

## 2022-04-10 DIAGNOSIS — D509 Iron deficiency anemia, unspecified: Secondary | ICD-10-CM | POA: Diagnosis not present

## 2022-04-10 DIAGNOSIS — I4892 Unspecified atrial flutter: Secondary | ICD-10-CM | POA: Diagnosis not present

## 2022-04-10 DIAGNOSIS — G4733 Obstructive sleep apnea (adult) (pediatric): Secondary | ICD-10-CM | POA: Diagnosis not present

## 2022-04-10 DIAGNOSIS — N189 Chronic kidney disease, unspecified: Secondary | ICD-10-CM | POA: Diagnosis not present

## 2022-04-10 DIAGNOSIS — E1151 Type 2 diabetes mellitus with diabetic peripheral angiopathy without gangrene: Secondary | ICD-10-CM | POA: Diagnosis not present

## 2022-04-10 DIAGNOSIS — N401 Enlarged prostate with lower urinary tract symptoms: Secondary | ICD-10-CM | POA: Diagnosis not present

## 2022-04-10 DIAGNOSIS — E1165 Type 2 diabetes mellitus with hyperglycemia: Secondary | ICD-10-CM | POA: Diagnosis not present

## 2022-04-11 DIAGNOSIS — N189 Chronic kidney disease, unspecified: Secondary | ICD-10-CM | POA: Diagnosis not present

## 2022-04-11 DIAGNOSIS — I5032 Chronic diastolic (congestive) heart failure: Secondary | ICD-10-CM | POA: Diagnosis not present

## 2022-04-11 DIAGNOSIS — I130 Hypertensive heart and chronic kidney disease with heart failure and stage 1 through stage 4 chronic kidney disease, or unspecified chronic kidney disease: Secondary | ICD-10-CM | POA: Diagnosis not present

## 2022-04-11 DIAGNOSIS — E1122 Type 2 diabetes mellitus with diabetic chronic kidney disease: Secondary | ICD-10-CM | POA: Diagnosis not present

## 2022-04-18 ENCOUNTER — Ambulatory Visit (INDEPENDENT_AMBULATORY_CARE_PROVIDER_SITE_OTHER): Payer: Medicare Other

## 2022-04-18 ENCOUNTER — Ambulatory Visit (INDEPENDENT_AMBULATORY_CARE_PROVIDER_SITE_OTHER): Payer: Medicare Other | Admitting: Nurse Practitioner

## 2022-04-18 ENCOUNTER — Encounter (INDEPENDENT_AMBULATORY_CARE_PROVIDER_SITE_OTHER): Payer: Self-pay | Admitting: Nurse Practitioner

## 2022-04-18 VITALS — BP 110/61 | HR 98 | Resp 16

## 2022-04-18 DIAGNOSIS — Z79899 Other long term (current) drug therapy: Secondary | ICD-10-CM | POA: Diagnosis not present

## 2022-04-18 DIAGNOSIS — Z89512 Acquired absence of left leg below knee: Secondary | ICD-10-CM | POA: Diagnosis not present

## 2022-04-18 DIAGNOSIS — L97522 Non-pressure chronic ulcer of other part of left foot with fat layer exposed: Secondary | ICD-10-CM

## 2022-04-18 DIAGNOSIS — E119 Type 2 diabetes mellitus without complications: Secondary | ICD-10-CM

## 2022-04-18 DIAGNOSIS — I10 Essential (primary) hypertension: Secondary | ICD-10-CM

## 2022-04-18 DIAGNOSIS — I739 Peripheral vascular disease, unspecified: Secondary | ICD-10-CM | POA: Diagnosis not present

## 2022-04-25 DIAGNOSIS — Z89429 Acquired absence of other toe(s), unspecified side: Secondary | ICD-10-CM | POA: Diagnosis not present

## 2022-04-25 DIAGNOSIS — S91001A Unspecified open wound, right ankle, initial encounter: Secondary | ICD-10-CM | POA: Diagnosis not present

## 2022-04-25 DIAGNOSIS — Z89612 Acquired absence of left leg above knee: Secondary | ICD-10-CM | POA: Diagnosis not present

## 2022-04-30 ENCOUNTER — Encounter (INDEPENDENT_AMBULATORY_CARE_PROVIDER_SITE_OTHER): Payer: Self-pay | Admitting: Nurse Practitioner

## 2022-04-30 NOTE — Progress Notes (Signed)
Subjective:    Patient ID: Alec Less., male    DOB: [birth year], 67 y.o.   MRN: [id number] Chief Complaint  Patient presents with   Follow-up    Ultrasound follow up       [name] is a 67 year old male who returns today for noninvasive studies.  He had a left below-knee amputation on 10/03/2021.  He had an extended hospitalization where he became confused but during this hospitalization the wound fully healed.  Today he is alert and oriented x 4.  His wound is well-healed and he is currently utilizing a shrinker sock.  He is lost a significant amount of weight and needs to obtain a new one.  He denies any open wounds or ulcerations.  No TIA or CVA-like symptoms.  Today the patient has a right ABI of 1.29 previously it was 0.88.  He has hyperemic tibial artery waveforms in the right    Review of Systems  Neurological:  Positive for weakness.  All other systems reviewed and are negative.      Objective:   Physical Exam Cardiovascular:     Rate and Rhythm: Normal rate.  Pulmonary:     Effort: Pulmonary effort is normal.  Musculoskeletal:     Right Lower Extremity: Right leg is amputated below knee.  Skin:    General: Skin is warm and dry.  Neurological:     Mental Status: He is oriented to person, place, and time.     Motor: Weakness present.     Gait: Gait abnormal.  Psychiatric:        Mood and Affect: Mood normal.        Behavior: Behavior normal.        Thought Content: Thought content normal.        Judgment: Judgment normal.     BP 110/61 (BP Location: Left Arm)   Pulse 98   Resp 16   Past Medical History:  Diagnosis Date   Arrhythmia    atrial fibrillation   Asthma    CHF (congestive heart failure) (HCC)    COPD (chronic obstructive pulmonary disease) (HCC)    Coronary artery disease    Depression    Diabetes mellitus without complication (HCC)    Gout    History anabolic steroid use    Hyperlipidemia    Hypertension    Hypogonadism  in male    Morbid obesity (HCC)    Myocardial infarction ([place])    Peripheral vascular disease (HCC)    Perirectal abscess    Pleurisy    Sleep apnea    CPAP at night, no oxygen   Varicella     Social History   Socioeconomic History   Marital status: Married    Spouse name: Not on file   Number of children: Not on file   Years of education: Not on file   Highest education level: Not on file  Occupational History   Not on file  Tobacco Use   Smoking status: Former    Packs/day: 0.50    Years: 45.00    Total pack years: 22.50    Types: Cigarettes    Quit date: 04/05/2015    Years since quitting: 7.0   Smokeless tobacco: Never  Vaping Use   Vaping Use: Every day  Substance and Sexual Activity   Alcohol use: Not Currently    Alcohol/week: 3.0 standard drinks of alcohol    Types: 3 Glasses of wine per week   Drug use:  No   Sexual activity: Not on file  Other Topics Concern   Not on file  Social History Narrative   Not on file   Social Determinants of Health   Financial Resource Strain: Not on file  Food Insecurity: No Food Insecurity (03/21/2022)   Hunger Vital Sign    Worried About Running Out of Food in the Last Year: Never true    Ran Out of Food in the Last Year: Never true  Transportation Needs: Unmet Transportation Needs (03/21/2022)   PRAPARE - Hydrologist (Medical): Yes    Lack of Transportation (Non-Medical): Yes  Physical Activity: Not on file  Stress: Not on file  Social Connections: Not on file  Intimate Partner Violence: Not At Risk (03/21/2022)   Humiliation, Afraid, Rape, and Kick questionnaire    Fear of Current or Ex-Partner: No    Emotionally Abused: No    Physically Abused: No    Sexually Abused: No    Past Surgical History:  Procedure Laterality Date   ABDOMINAL AORTIC ANEURYSM REPAIR     ACHILLES TENDON SURGERY Left 01/10/2021   Procedure: ACHILLES LENGTHENING/KIDNER;  Surgeon: Caroline More, DPM;   Location: [facility];  Service: Podiatry;  Laterality: Left;   AMPUTATION Left 10/03/2021   Procedure: AMPUTATION BELOW KNEE;  Surgeon: Katha Cabal, MD;  Location: [facility];  Service: Vascular;  Laterality: Left;   AMPUTATION TOE Right 02/10/2016   Procedure: AMPUTATION TOE 3RD TOE;  Surgeon: Samara Deist, DPM;  Location: [facility];  Service: Podiatry;  Laterality: Right;   AMPUTATION TOE Left 02/24/2020   Procedure: AMPUTATION TOE;  Surgeon: Caroline More, DPM;  Location: [facility];  Service: Podiatry;  Laterality: Left;   APPLICATION OF WOUND VAC Left 02/29/2020   Procedure: APPLICATION OF WOUND VAC;  Surgeon: Caroline More, DPM;  Location: [facility];  Service: Podiatry;  Laterality: Left;   CARDIOVERSION [name] 02/13/2022   Procedure: CARDIOVERSION;  Surgeon: Corey Skains, MD;  Location: [facility];  Service: Cardiovascular;  Laterality: [name];   COLONOSCOPY WITH PROPOFOL [name] 11/18/2015   Procedure: COLONOSCOPY WITH PROPOFOL;  Surgeon: Manya Silvas, MD;  Location: [facility];  Service: Endoscopy;  Laterality: [name];   CORONARY ARTERY BYPASS GRAFT     CORONARY STENT INTERVENTION [name] 02/02/2020   Procedure: CORONARY STENT INTERVENTION;  Surgeon: Isaias Cowman, MD;  Location: [place] [facility];  Service: Cardiovascular;  Laterality: [name];   INCISION AND DRAINAGE Left 08/07/2021   Procedure: INCISION AND DRAINAGE-Partial Calcanectomy;  Surgeon: Caroline More, DPM;  Location: [facility];  Service: Podiatry;  Laterality: Left;   IRRIGATION AND DEBRIDEMENT FOOT Left 02/29/2020   Procedure: IRRIGATION AND DEBRIDEMENT FOOT;  Surgeon: Caroline More, DPM;  Location: [facility];  Service: Podiatry;  Laterality: Left;   IRRIGATION AND DEBRIDEMENT FOOT Left 02/24/2020   Procedure: IRRIGATION AND DEBRIDEMENT FOOT;  Surgeon: Caroline More, DPM;  Location: [facility];  Service: Podiatry;  Laterality: Left;   KNEE ARTHROSCOPY     LEFT HEART CATH AND CORS/GRAFTS ANGIOGRAPHY [name] 02/02/2020   Procedure: LEFT  HEART CATH AND CORS/GRAFTS ANGIOGRAPHY;  Surgeon: Teodoro Spray, MD;  Location: [place] [facility];  Service: Cardiovascular;  Laterality: [name];   LOWER EXTREMITY ANGIOGRAPHY Left 02/25/2020   Procedure: Lower Extremity Angiography;  Surgeon: Algernon Huxley, MD;  Location: [place] [facility];  Service: Cardiovascular;  Laterality: Left;   LOWER EXTREMITY ANGIOGRAPHY Left 01/04/2021   Procedure: LOWER EXTREMITY ANGIOGRAPHY;  Surgeon: Algernon Huxley, MD;  Location: [place] [facility];  Service: Cardiovascular;  Laterality: Left;   METATARSAL HEAD EXCISION Left 01/10/2021   Procedure: METATARSAL HEAD EXCISION - LEFT 5th;  Surgeon: Caroline More, DPM;  Location: [facility];  Service: Podiatry;  Laterality: Left;   PERIPHERAL VASCULAR CATHETERIZATION Right 01/24/2016   Procedure: Lower Extremity Angiography;  Surgeon: Katha Cabal, MD;  Location: [place] [facility];  Service: Cardiovascular;  Laterality: Right;   PERIPHERAL VASCULAR CATHETERIZATION Right 01/25/2016   Procedure: Lower Extremity Angiography;  Surgeon: Katha Cabal, MD;  Location: [place] [facility];  Service: Cardiovascular;  Laterality: Right;   TOE AMPUTATION     TONSILLECTOMY      Family History  Problem Relation Age of Onset   Hypertension Father    Coronary artery disease Father    Alcohol abuse Father    Heart failure Brother     Allergies  Allergen Reactions   Penicillins Other (See Comments)    Happened at 67 years old and pt. stated he passed out  He has tolerated amoxicillin/clavulanate and ampicillin/sulbactam   Statins     Other reaction(s): Muscle Pain Causes legs to ache per pt   Metformin And Related Diarrhea       Latest Ref Rng & Units 03/26/2022    5:53 PM 03/20/2022    4:39 AM 03/18/2022    4:22 AM  CBC  WBC 4.0 - 10.5 K/uL 11.5  7.7  6.8   Hemoglobin 13.0 - 17.0 g/dL 10.0  8.4  7.9   Hematocrit 39.0 - 52.0 % 32.2  26.5  23.4   Platelets 150 - 400 K/uL 315  201  122       CMP      Component Value Date/Time   NA 142 03/26/2022 1753   NA 141 07/12/2011 0919   K 3.7 03/26/2022 1753   K 4.4 07/12/2011 0919   CL 103 03/26/2022 1753   CL 102 07/12/2011 0919   CO2 30 03/26/2022 1753   CO2 29 07/12/2011 0919   GLUCOSE 230 (H) 03/26/2022 1753   GLUCOSE 76 07/12/2011 0919   BUN 18 03/26/2022 1753   BUN 16 05/05/2013 1022   CREATININE 0.99 03/26/2022 1753   CREATININE 0.71 05/05/2013 1022   CALCIUM 8.6 (L) 03/26/2022 1753   CALCIUM 8.8 07/12/2011 0919   PROT 7.4 03/26/2022 1753   ALBUMIN 2.7 (L) 03/26/2022 1753   AST 19 03/26/2022 1753   ALT 12 03/26/2022 1753   ALKPHOS 207 (H) 03/26/2022 1753   BILITOT 0.6 03/26/2022 1753   GFRNONAA >60 03/26/2022 1753   GFRNONAA >60 05/05/2013 1022   GFRAA >60 01/06/2020 1853   GFRAA >60 05/05/2013 1022     VAS [place] ABI WITH/WO TBI  Result Date: 04/26/2022  LOWER EXTREMITY DOPPLER STUDY Patient Name:  Nanine Means Lesniewski Jr.  Date of Exam:   04/18/2022 Medical Rec #: [id number]              Accession #:    [id number] Date of Birth: 1956-04-08             Patient Gender: M Patient Age:   42 years Exam Location:  [facility] Procedure:      VAS [place] ABI WITH/WO TBI Referring Phys: Corene Cornea DEW --------------------------------------------------------------------------------  Indications: Ulceration, and peripheral artery disease. left BKA  Vascular Interventions: 01/24/16: Right SFA/poplteal thrombectomy/PTA;  01/25/16: Right SFA/popliteal & ATA thrombectomy/PTA;                         02/25/20: Left popliteal, TP trunk, PTA angioplasties;                         10/03/21: Left BKA;. Performing Technologist: Blondell Reveal RT, RDMS, RVT  Examination Guidelines: A complete evaluation includes at minimum, Doppler waveform signals and systolic blood pressure reading at the level of bilateral brachial, anterior tibial, and posterior tibial arteries, when vessel segments are accessible. Bilateral testing is considered  an integral part of a complete examination. Photoelectric Plethysmograph (PPG) waveforms and toe systolic pressure readings are included as required and additional duplex testing as needed. Limited examinations for reoccurring indications may be performed as noted.  ABI Findings: +---------+------------------+-----+------------------+-------------------+ Right    Rt Pressure (mmHg)IndexWaveform          Comment             +---------+------------------+-----+------------------+-------------------+ Brachial 123                                                          +---------+------------------+-----+------------------+-------------------+ Popliteal                       triphasic         obtained via duplex +---------+------------------+-----+------------------+-------------------+ PTA      86                0.70 dampened hyperemic                    +---------+------------------+-----+------------------+-------------------+ PERO                            hyperemic         obtained via duplex +---------+------------------+-----+------------------+-------------------+ DP       159               1.29 hyperemic         audibly multiphasic +---------+------------------+-----+------------------+-------------------+ Great Toe162               1.32 Abnormal                              +---------+------------------+-----+------------------+-------------------+ +-------+-----------+-----------+------------+------------+ ABI/TBIToday's ABIToday's TBIPrevious ABIPrevious TBI +-------+-----------+-----------+------------+------------+ Right  1.29       1.32       0.88        0.97         +-------+-----------+-----------+------------+------------+ Left   BKA                   1.05        1.03         +-------+-----------+-----------+------------+------------+ No significant change in the right tibial or digit waveforms when compared to the previous exam on 07/18/21.   Summary: Right: Resting right ankle-brachial and toe-brachial indices are within normal range, however, the pressures may be falsely elevated due to patient's exam. *See table(s) above for measurements and observations.  Electronically signed by [name] MD on 04/26/2022 at 9:55:18 AM.    Final        Assessment &  Plan:   1. Hx of BKA, left [facility]) Mr Lazaro Was seen for further evaluation for left below knee prosthesis.He Is a man who had amputation on 10/03/2021.  At the time he is well healed and ready for fitting of new below knee prosthesis.  He is a highly motivated individual and should do well once fitted with prosthesis.  She has no problem returning to a K2 ambulator, which will allow him to walk inside and outside of his home and overload level barriers.  2. PAD (peripheral artery disease) ([place]) Patient studies today remain stable.  He does not have any rest pain or open ulcerations.  While he is not walking significantly he denies anything resembling claudication.  3. Essential hypertension Continue antihypertensive medications as already ordered, these medications have been reviewed and there are no changes at this time.  4. Diabetes mellitus without complication ([place]) Continue hypoglycemic medications as already ordered, these medications have been reviewed and there are no changes at this time.  Hgb A1C to be monitored as already arranged by primary service   Current Outpatient Medications on File Prior to Visit  Medication Sig Dispense Refill   acetaminophen (TYLENOL) 325 MG tablet Take 2 tablets (650 mg total) by mouth every 6 (six) hours as needed for mild pain (or Fever >/= 101).     amiodarone (PACERONE) 200 MG tablet Take 200 mg by mouth daily.     budesonide (PULMICORT) 0.5 MG/2ML nebulizer solution Take 2 mLs (0.5 mg total) by nebulization 2 (two) times daily. 120 mL 0   Cholecalciferol 25 MCG (1000 UT) tablet Take 1,000 Units by mouth daily.     Cyanocobalamin  (VITAMIN B-12) 5000 MCG TBDP Take 10,000 mcg by mouth daily.     diltiazem (CARDIZEM SR) 120 MG 12 hr capsule Take 1 capsule (120 mg total) by mouth every 12 (twelve) hours. 60 capsule 0   ezetimibe (ZETIA) 10 MG tablet Take 10 mg by mouth at bedtime.     ferrous sulfate 325 (65 FE) MG tablet Take 325 mg by mouth daily with breakfast.     finasteride (PROSCAR) 5 MG tablet Take 1 tablet (5 mg total) by mouth daily. 30 tablet 0   furosemide (LASIX) 20 MG tablet Take 2 tablets (40 mg total) by mouth daily. 30 tablet 0   HUMALOG 100 UNIT/ML injection Inject 15 Units into the skin 3 (three) times daily with meals. >200     insulin glargine-yfgn (SEMGLEE) 100 UNIT/ML injection Inject 0.25 mLs (25 Units total) into the skin at bedtime. (Patient taking differently: Inject 0-25 Units into the skin every morning.) 10 mL 11   levothyroxine (SYNTHROID) 25 MCG tablet Take 1 tablet (25 mcg total) by mouth daily at 6 (six) AM. 30 tablet 0   Multiple Vitamins-Minerals (MULTIVITAMIN WITH MINERALS) tablet Take 1 tablet by mouth daily.     primidone (MYSOLINE) 250 MG tablet Take 250 mg by mouth 2 (two) times daily.     risperiDONE (RISPERDAL) 0.5 MG tablet Take 1 tablet (0.5 mg total) by mouth 2 (two) times daily. 60 tablet 0   tamsulosin (FLOMAX) 0.4 MG CAPS capsule Take 0.4 mg by mouth daily.     traMADol (ULTRAM) 50 MG tablet Take 1 tablet (50 mg total) by mouth daily as needed for moderate pain. 10 tablet 0   vitamin C (ASCORBIC ACID) 500 MG tablet Take 500 mg by mouth daily.     No current facility-administered medications on file prior to visit.  There are no Patient Instructions on file for this visit. No follow-ups on file.   Kris Hartmann, NP

## 2022-05-02 ENCOUNTER — Ambulatory Visit
Admission: RE | Admit: 2022-05-02 | Discharge: 2022-05-02 | Disposition: A | Discharge status: Home / Self Care | Payer: Medicare Other | Source: Ambulatory Visit | Attending: Urology | Admitting: Urology

## 2022-05-02 DIAGNOSIS — K8020 Calculus of gallbladder without cholecystitis without obstruction: Secondary | ICD-10-CM | POA: Diagnosis not present

## 2022-05-02 DIAGNOSIS — K760 Fatty (change of) liver, not elsewhere classified: Secondary | ICD-10-CM | POA: Diagnosis not present

## 2022-05-02 DIAGNOSIS — S37019A Minor contusion of unspecified kidney, initial encounter: Secondary | ICD-10-CM | POA: Insufficient documentation

## 2022-05-02 DIAGNOSIS — N281 Cyst of kidney, acquired: Secondary | ICD-10-CM | POA: Diagnosis not present

## 2022-05-02 MED ORDER — IOHEXOL 350 MG/ML SOLN
100.0000 mL | Freq: Once | INTRAVENOUS | Status: AC | PRN
  Administered 2022-05-02: 100 mL via INTRAVENOUS

## 2022-05-04 ENCOUNTER — Telehealth (INDEPENDENT_AMBULATORY_CARE_PROVIDER_SITE_OTHER): Payer: Self-pay | Admitting: Vascular Surgery

## 2022-05-04 DIAGNOSIS — E1142 Type 2 diabetes mellitus with diabetic polyneuropathy: Secondary | ICD-10-CM | POA: Diagnosis not present

## 2022-05-04 DIAGNOSIS — Z89512 Acquired absence of left leg below knee: Secondary | ICD-10-CM | POA: Diagnosis not present

## 2022-05-04 DIAGNOSIS — L97312 Non-pressure chronic ulcer of right ankle with fat layer exposed: Secondary | ICD-10-CM | POA: Diagnosis not present

## 2022-05-04 DIAGNOSIS — Z89421 Acquired absence of other right toe(s): Secondary | ICD-10-CM | POA: Diagnosis not present

## 2022-05-04 NOTE — Telephone Encounter (Signed)
Patient's wife called and stated patient has a wound on the right inside ankle due to him hitting it on the wheelchair, causing a hold on the inside of ankle.  Went to PCP and was told to see jd to see what kine of circulation he has because he did not bleed at all and the back of his calf is very sore even to the touch.  Please advise.

## 2022-05-07 ENCOUNTER — Other Ambulatory Visit (INDEPENDENT_AMBULATORY_CARE_PROVIDER_SITE_OTHER): Payer: Self-pay | Admitting: Nurse Practitioner

## 2022-05-07 DIAGNOSIS — S81801S Unspecified open wound, right lower leg, sequela: Secondary | ICD-10-CM

## 2022-05-07 NOTE — Telephone Encounter (Signed)
We just did blood flow studies less than a month ago and he should be able to heal a wound.  We can see him with ABIs to re-assess blood flow, but as for the wound itself, that will need to be followed by his PCP or wound care.

## 2022-05-08 ENCOUNTER — Encounter (INDEPENDENT_AMBULATORY_CARE_PROVIDER_SITE_OTHER): Payer: Self-pay

## 2022-05-08 ENCOUNTER — Ambulatory Visit (INDEPENDENT_AMBULATORY_CARE_PROVIDER_SITE_OTHER): Payer: Self-pay | Admitting: Vascular Surgery

## 2022-05-08 DIAGNOSIS — Z4781 Encounter for orthopedic aftercare following surgical amputation: Secondary | ICD-10-CM | POA: Diagnosis not present

## 2022-05-08 DIAGNOSIS — M6281 Muscle weakness (generalized): Secondary | ICD-10-CM | POA: Diagnosis not present

## 2022-05-08 DIAGNOSIS — J9601 Acute respiratory failure with hypoxia: Secondary | ICD-10-CM | POA: Diagnosis not present

## 2022-05-09 ENCOUNTER — Ambulatory Visit (INDEPENDENT_AMBULATORY_CARE_PROVIDER_SITE_OTHER): Payer: Medicare Other | Admitting: Urology

## 2022-05-09 VITALS — BP 139/67 | HR 97 | Ht 76.0 in

## 2022-05-09 DIAGNOSIS — G4733 Obstructive sleep apnea (adult) (pediatric): Secondary | ICD-10-CM | POA: Diagnosis not present

## 2022-05-09 DIAGNOSIS — S37019A Minor contusion of unspecified kidney, initial encounter: Secondary | ICD-10-CM

## 2022-05-09 DIAGNOSIS — S37019D Minor contusion of unspecified kidney, subsequent encounter: Secondary | ICD-10-CM | POA: Diagnosis not present

## 2022-05-09 DIAGNOSIS — N189 Chronic kidney disease, unspecified: Secondary | ICD-10-CM | POA: Diagnosis not present

## 2022-05-09 DIAGNOSIS — Z87440 Personal history of urinary (tract) infections: Secondary | ICD-10-CM

## 2022-05-09 DIAGNOSIS — D509 Iron deficiency anemia, unspecified: Secondary | ICD-10-CM | POA: Diagnosis not present

## 2022-05-09 DIAGNOSIS — I5032 Chronic diastolic (congestive) heart failure: Secondary | ICD-10-CM | POA: Diagnosis not present

## 2022-05-09 DIAGNOSIS — E1165 Type 2 diabetes mellitus with hyperglycemia: Secondary | ICD-10-CM | POA: Diagnosis not present

## 2022-05-09 DIAGNOSIS — N401 Enlarged prostate with lower urinary tract symptoms: Secondary | ICD-10-CM | POA: Diagnosis not present

## 2022-05-09 DIAGNOSIS — I480 Paroxysmal atrial fibrillation: Secondary | ICD-10-CM | POA: Diagnosis not present

## 2022-05-09 DIAGNOSIS — J4489 Other specified chronic obstructive pulmonary disease: Secondary | ICD-10-CM | POA: Diagnosis not present

## 2022-05-09 DIAGNOSIS — F339 Major depressive disorder, recurrent, unspecified: Secondary | ICD-10-CM | POA: Diagnosis not present

## 2022-05-09 DIAGNOSIS — E1151 Type 2 diabetes mellitus with diabetic peripheral angiopathy without gangrene: Secondary | ICD-10-CM | POA: Diagnosis not present

## 2022-05-09 DIAGNOSIS — I2510 Atherosclerotic heart disease of native coronary artery without angina pectoris: Secondary | ICD-10-CM | POA: Diagnosis not present

## 2022-05-09 DIAGNOSIS — I130 Hypertensive heart and chronic kidney disease with heart failure and stage 1 through stage 4 chronic kidney disease, or unspecified chronic kidney disease: Secondary | ICD-10-CM | POA: Diagnosis not present

## 2022-05-09 DIAGNOSIS — J9611 Chronic respiratory failure with hypoxia: Secondary | ICD-10-CM | POA: Diagnosis not present

## 2022-05-09 DIAGNOSIS — I4892 Unspecified atrial flutter: Secondary | ICD-10-CM | POA: Diagnosis not present

## 2022-05-09 DIAGNOSIS — N39498 Other specified urinary incontinence: Secondary | ICD-10-CM | POA: Diagnosis not present

## 2022-05-09 DIAGNOSIS — E1122 Type 2 diabetes mellitus with diabetic chronic kidney disease: Secondary | ICD-10-CM | POA: Diagnosis not present

## 2022-05-09 DIAGNOSIS — N12 Tubulo-interstitial nephritis, not specified as acute or chronic: Secondary | ICD-10-CM

## 2022-05-09 NOTE — Telephone Encounter (Signed)
Patient has an appt on 05/11/22.

## 2022-05-09 NOTE — Progress Notes (Signed)
I, [name],acting as a scribe for Hollice Espy, MD.,have documented all relevant documentation on the behalf of Hollice Espy, MD,as directed by  Hollice Espy, MD while in the presence of Hollice Espy, MD.   I, [name],acting as a scribe for Hollice Espy, MD.,have documented all relevant documentation on the behalf of Hollice Espy, MD,as directed by  Hollice Espy, MD while in the presence of Hollice Espy, MD.  05/09/2022 4:46 PM   [name]. May 30, 1955 [id number]  Referring provider: [name], MD [facility] [place],  [place] [address]  Chief Complaint  Patient presents with   Follow-up    HPI: 67 year-old male who returns today for follow up with a serial CT scan.   He was first originally seen after presenting with sepsis where he was found   found to have right-sided perinephric hematoma with probable emphysematous pyelonephritis. He was treated with one week of IV antibiotics and otherwise managed conservatively. He was seen as an outpatient in early December at which time his hemoglobin was starting to trend back upwards. Repeat imaging showed in fact slightly increased size of the subcapsular component of the hematoma at that point measuring 5.2 x 9.5. We elected to continue to hold his anticoagulation at that time.  Reviewed CT which showed similar subcapsular hematoma. Perhaps mildly decreased in size but overall fairly stable. There is still decreased perfusion of the right kidney. As well as slightly Wood extracapsular extension of the hematoma. There continues to be scattered subcutaneous gas pockets which have not resolved.  He has had interval labs most recently on 04/18/2022 which shows upward trending hemoglobin to 11.0, hematocrit of 35. His creatinine is stable at 1.3. He had no leukocytosis appreciated.   He continues to have throbbing pain on the right side that keeps him awake at night. He  denies any fevers, chills, gross hematuria, or difficulty with urination.   PMH: Past Medical History:  Diagnosis Date   Arrhythmia    atrial fibrillation   Asthma    CHF (congestive heart failure) (HCC)    COPD (chronic obstructive pulmonary disease) (HCC)    Coronary artery disease    Depression    Diabetes mellitus without complication (HCC)    Gout    History anabolic steroid use    Hyperlipidemia    Hypertension    Hypogonadism in male    Morbid obesity ([place])    Myocardial infarction ([place])    Peripheral vascular disease ([place])    Perirectal abscess    Pleurisy    Sleep apnea    CPAP at night, no oxygen   Varicella     Surgical History: Past Surgical History:  Procedure Laterality Date   ABDOMINAL AORTIC ANEURYSM REPAIR     ACHILLES TENDON SURGERY Left 01/10/2021   Procedure: ACHILLES LENGTHENING/KIDNER;  Surgeon: Caroline More, DPM;  Location: [facility];  Service: Podiatry;  Laterality: Left;   AMPUTATION Left 10/03/2021   Procedure: AMPUTATION BELOW KNEE;  Surgeon: Katha Cabal, MD;  Location: [facility];  Service: Vascular;  Laterality: Left;   AMPUTATION TOE Right 02/10/2016   Procedure: AMPUTATION TOE 3RD TOE;  Surgeon: Samara Deist, DPM;  Location: [facility];  Service: Podiatry;  Laterality: Right;   AMPUTATION TOE Left 02/24/2020   Procedure: AMPUTATION TOE;  Surgeon: Caroline More, DPM;  Location: [facility];  Service: Podiatry;  Laterality: Left;   APPLICATION OF WOUND VAC Left 02/29/2020   Procedure: APPLICATION  OF WOUND VAC;  Surgeon: Caroline More, DPM;  Location: [facility];  Service: Podiatry;  Laterality: Left;   CARDIOVERSION [name] 02/13/2022   Procedure: CARDIOVERSION;  Surgeon: Corey Skains, MD;  Location: [facility];  Service: Cardiovascular;  Laterality: [name];   COLONOSCOPY WITH PROPOFOL [name] 11/18/2015   Procedure: COLONOSCOPY WITH PROPOFOL;  Surgeon: Manya Silvas, MD;  Location[facility];  Service: Endoscopy;  Laterality: [name];   CORONARY  ARTERY BYPASS GRAFT     CORONARY STENT INTERVENTION [name] 02/02/2020   Procedure: CORONARY STENT INTERVENTION;  Surgeon: Isaias Cowman, MD;  Location: [place] [facility];  Service: Cardiovascular;  Laterality: [name];   INCISION AND DRAINAGE Left 08/07/2021   Procedure: INCISION AND DRAINAGE-Partial Calcanectomy;  Surgeon: Caroline More, DPM;  Location: [facility];  Service: Podiatry;  Laterality: Left;   IRRIGATION AND DEBRIDEMENT FOOT Left 02/29/2020   Procedure: IRRIGATION AND DEBRIDEMENT FOOT;  Surgeon: Caroline More, DPM;  Location: [facility];  Service: Podiatry;  Laterality: Left;   IRRIGATION AND DEBRIDEMENT FOOT Left 02/24/2020   Procedure: IRRIGATION AND DEBRIDEMENT FOOT;  Surgeon: Caroline More, DPM;  Location: [facility];  Service: Podiatry;  Laterality: Left;   KNEE ARTHROSCOPY     LEFT HEART CATH AND CORS/GRAFTS ANGIOGRAPHY [name] 02/02/2020   Procedure: LEFT HEART CATH AND CORS/GRAFTS ANGIOGRAPHY;  Surgeon: Teodoro Spray, MD;  Location: [place] [facility];  Service: Cardiovascular;  Laterality: [name];   LOWER EXTREMITY ANGIOGRAPHY Left 02/25/2020   Procedure: Lower Extremity Angiography;  Surgeon: Algernon Huxley, MD;  Location: [place] [facility];  Service: Cardiovascular;  Laterality: Left;   LOWER EXTREMITY ANGIOGRAPHY Left 01/04/2021   Procedure: LOWER EXTREMITY ANGIOGRAPHY;  Surgeon: Algernon Huxley, MD;  Location: [place] [facility];  Service: Cardiovascular;  Laterality: Left;   METATARSAL HEAD EXCISION Left 01/10/2021   Procedure: METATARSAL HEAD EXCISION - LEFT 5th;  Surgeon: Caroline More, DPM;  Location: [facility];  Service: Podiatry;  Laterality: Left;   PERIPHERAL VASCULAR CATHETERIZATION Right 01/24/2016   Procedure: Lower Extremity Angiography;  Surgeon: Katha Cabal, MD;  Location: [place] [facility];  Service: Cardiovascular;  Laterality: Right;   PERIPHERAL VASCULAR CATHETERIZATION Right 01/25/2016   Procedure: Lower Extremity Angiography;  Surgeon: Katha Cabal,  MD;  Location: [place] [facility];  Service: Cardiovascular;  Laterality: Right;   TOE AMPUTATION     TONSILLECTOMY      Home Medications:  Allergies as of 05/09/2022       Reactions   Penicillins Other (See Comments)   Happened at 67 years old and pt. stated he passed out  He has tolerated amoxicillin/clavulanate and ampicillin/sulbactam   Statins    Other reaction(s): Muscle Pain Causes legs to ache per pt   Metformin And Related Diarrhea        Medication List        Accurate as of May 09, 2022  4:46 PM. If you have any questions, ask your nurse or doctor.          acetaminophen 325 MG tablet Commonly known as: TYLENOL Take 2 tablets (650 mg total) by mouth every 6 (six) hours as needed for mild pain (or Fever >/= 101).   amiodarone 200 MG tablet Commonly known as: PACERONE Take 200 mg by mouth daily.   ascorbic acid 500 MG tablet Commonly known as: VITAMIN C Take 500 mg by mouth daily.   budesonide 0.5 MG/2ML nebulizer solution Commonly known as: PULMICORT Take 2 mLs (0.5 mg total) by  nebulization 2 (two) times daily.   Cholecalciferol 25 MCG (1000 UT) tablet Take 1,000 Units by mouth daily.   diltiazem 120 MG 12 hr capsule Commonly known as: CARDIZEM SR Take 1 capsule (120 mg total) by mouth every 12 (twelve) hours.   ezetimibe 10 MG tablet Commonly known as: ZETIA Take 10 mg by mouth at bedtime.   ferrous sulfate 325 (65 FE) MG tablet Take 325 mg by mouth daily with breakfast.   finasteride 5 MG tablet Commonly known as: PROSCAR Take 1 tablet (5 mg total) by mouth daily.   furosemide 20 MG tablet Commonly known as: LASIX Take 2 tablets (40 mg total) by mouth daily.   HumaLOG 100 UNIT/ML injection Generic drug: insulin lispro Inject 15 Units into the skin 3 (three) times daily with meals. >200   insulin glargine-yfgn 100 UNIT/ML injection Commonly known as: SEMGLEE Inject 0.25 mLs (25 Units total) into the skin at bedtime. What  changed:  how much to take when to take this   levothyroxine 25 MCG tablet Commonly known as: SYNTHROID Take 1 tablet (25 mcg total) by mouth daily at 6 (six) AM.   multivitamin with minerals tablet Take 1 tablet by mouth daily.   primidone 250 MG tablet Commonly known as: MYSOLINE Take 250 mg by mouth 2 (two) times daily.   risperiDONE 0.5 MG tablet Commonly known as: RISPERDAL Take 1 tablet (0.5 mg total) by mouth 2 (two) times daily.   sulfamethoxazole-trimethoprim 800-160 MG tablet Commonly known as: BACTRIM DS Take by mouth.   tamsulosin 0.4 MG Caps capsule Commonly known as: FLOMAX Take 0.4 mg by mouth daily.   traMADol 50 MG tablet Commonly known as: ULTRAM Take 1 tablet (50 mg total) by mouth daily as needed for moderate pain.   Vitamin B-12 5000 MCG Tbdp Take 10,000 mcg by mouth daily.        Allergies:  Allergies  Allergen Reactions   Penicillins Other (See Comments)    Happened at 67 years old and pt. stated he passed out  He has tolerated amoxicillin/clavulanate and ampicillin/sulbactam   Statins     Other reaction(s): Muscle Pain Causes legs to ache per pt   Metformin And Related Diarrhea    Family History: Family History  Problem Relation Age of Onset   Hypertension Father    Coronary artery disease Father    Alcohol abuse Father    Heart failure Brother     Social History:  reports that he quit smoking about 7 years ago. His smoking use included cigarettes. He has a 22.50 pack-year smoking history. He has never used smokeless tobacco. He reports that he does not currently use alcohol after a past usage of about 3.0 standard drinks of alcohol per week. He reports that he does not use drugs.   Physical Exam: BP 139/67   Pulse 97   Ht '6\' 4"'$  (1.93 m)   BMI 36.88 kg/m   Constitutional:  Alert and oriented, No acute distress. HEENT: [place] AT, moist mucus membranes.  Trachea midline, no masses. Neurologic: Grossly intact, no focal deficits,  moving all 4 extremities. Psychiatric: Normal mood and affect.  Pertinent Imaging: EXAM: CT ABDOMEN AND PELVIS WITH CONTRAST   TECHNIQUE: Multidetector CT imaging of the abdomen and pelvis was performed using the standard protocol following bolus administration of intravenous contrast.   RADIATION DOSE REDUCTION: This exam was performed according to the departmental dose-optimization program which includes automated exposure control, adjustment of the mA and/or kV according to  patient size and/or use of iterative reconstruction technique.   CONTRAST:  195m OMNIPAQUE IOHEXOL 350 MG/ML SOLN   COMPARISON:  04/02/2022   FINDINGS: Lower chest: Unremarkable.   Hepatobiliary: The liver shows diffusely decreased attenuation suggesting fat deposition. Calcified gallstones evident. No intrahepatic or extrahepatic biliary dilation.   Pancreas: No focal mass lesion. No dilatation of the main duct. No intraparenchymal cyst. No peripancreatic edema.   Spleen: No splenomegaly. No focal mass lesion.   Adrenals/Urinary Tract: No adrenal nodule or mass.   The subcapsular hematoma noted posteriorly in the right kidney measures 7.9 x 4.2 x 13.5 cm today. This compares to 9.5 x 5.2 x 13.3 cm previously. Mildly decreased perfusion to the right kidney is again noted. Inferior extra capsular extension again noted measuring 6.9 x 4.2 cm at the same level it was measured previously at 9.0 x 4.1 cm. As before, there are scattered pockets of gas in the subcapsular and retroperitoneal components of this process. Perinephric edema/strandy hemorrhage persists.   Stable appearance of the left kidney with small exophytic cyst noted posteriorly. No hydroureteronephrosis. The urinary bladder appears normal for the degree of distention.   Stomach/Bowel: Tiny hiatal hernia. Stomach otherwise unremarkable. Duodenum is normally positioned as is the ligament of Treitz. No small bowel wall thickening. No  small bowel dilatation. The terminal ileum is normal. The appendix is not well visualized, but there is no edema or inflammation in the region of the cecum. No gross colonic mass. No colonic wall thickening. Diverticular changes are noted in the left colon without evidence of diverticulitis.   Vascular/Lymphatic: There is mild atherosclerotic calcification of the abdominal aorta without aneurysm. Scattered small lymph nodes are seen in the hepatoduodenal ligament and retroperitoneal soft tissues. No pelvic sidewall lymphadenopathy. Upper normal lymph nodes are seen in the right external iliac chain and right groin.   Reproductive: The prostate gland and seminal vesicles are unremarkable.   Other: No intraperitoneal free fluid.   Musculoskeletal: No worrisome lytic or sclerotic osseous abnormality.   IMPRESSION: 1. No substantial interval change in the right renal subcapsular hematoma with inferior extra capsular extension. As before, there are scattered pockets of gas in the subcapsular and retroperitoneal components of this process. Superinfection of the hematoma cannot be excluded. 2. Persistent marked mass-effect on the right kidney secondary to the subcapsular hematoma. Subtle differential decreased perfusion to the right kidney again noted. 3. Hepatic steatosis. 4. Cholelithiasis. 5. Left colonic diverticulosis without diverticulitis. 6. Tiny hiatal hernia. 7. Upper normal lymph nodes in the right external iliac chain and right groin. These are likely reactive. Continued attention on follow-up recommended. 8.  Aortic Atherosclerosis (ICD10-I70.0).     Electronically Signed   By: EMisty StanleyM.D.   On: 05/03/2022 15:49  This was personally reviewed and agree with the radiologic interpretation.  Assessment & Plan:    Chronic right perinephric hematoma/emphysematous pyelonephritis - He's exhibiting no signs of systemic infection. No evidence of active or ongoing  bleeding. It's probably safe at this point in time to go ahead and start him back on his anticoagulation. We can recheck an H&H next week to ensure that there's no active bleeding or precautions are going to be reviewed. It's reasonable to repeat the imaging again in three months in increasing intervals. Given the severity and marked abnormality to ensure the continued ongoing improvement.  Return for 1 weeks for lab (CBC), 3 mo with CT scan.  [id number] [place]  [address] [phone]   

## 2022-05-10 ENCOUNTER — Encounter: Payer: Self-pay | Admitting: Family

## 2022-05-10 ENCOUNTER — Ambulatory Visit: Payer: BC Managed Care – PPO | Attending: Family | Admitting: Family

## 2022-05-10 ENCOUNTER — Encounter: Payer: Self-pay | Admitting: Pharmacy Technician

## 2022-05-10 VITALS — BP 122/62 | HR 91 | Resp 18

## 2022-05-10 DIAGNOSIS — E1151 Type 2 diabetes mellitus with diabetic peripheral angiopathy without gangrene: Secondary | ICD-10-CM | POA: Insufficient documentation

## 2022-05-10 DIAGNOSIS — I5032 Chronic diastolic (congestive) heart failure: Secondary | ICD-10-CM | POA: Insufficient documentation

## 2022-05-10 DIAGNOSIS — I739 Peripheral vascular disease, unspecified: Secondary | ICD-10-CM | POA: Diagnosis not present

## 2022-05-10 DIAGNOSIS — I4891 Unspecified atrial fibrillation: Secondary | ICD-10-CM | POA: Diagnosis not present

## 2022-05-10 DIAGNOSIS — I10 Essential (primary) hypertension: Secondary | ICD-10-CM | POA: Diagnosis not present

## 2022-05-10 DIAGNOSIS — Z8249 Family history of ischemic heart disease and other diseases of the circulatory system: Secondary | ICD-10-CM | POA: Insufficient documentation

## 2022-05-10 DIAGNOSIS — I480 Paroxysmal atrial fibrillation: Secondary | ICD-10-CM | POA: Diagnosis not present

## 2022-05-10 DIAGNOSIS — G4730 Sleep apnea, unspecified: Secondary | ICD-10-CM | POA: Insufficient documentation

## 2022-05-10 DIAGNOSIS — I2510 Atherosclerotic heart disease of native coronary artery without angina pectoris: Secondary | ICD-10-CM | POA: Diagnosis not present

## 2022-05-10 DIAGNOSIS — I110 Hypertensive heart disease with heart failure: Secondary | ICD-10-CM | POA: Diagnosis not present

## 2022-05-10 DIAGNOSIS — Z89512 Acquired absence of left leg below knee: Secondary | ICD-10-CM | POA: Diagnosis not present

## 2022-05-10 DIAGNOSIS — E785 Hyperlipidemia, unspecified: Secondary | ICD-10-CM | POA: Insufficient documentation

## 2022-05-10 DIAGNOSIS — I890 Lymphedema, not elsewhere classified: Secondary | ICD-10-CM | POA: Insufficient documentation

## 2022-05-10 NOTE — Progress Notes (Signed)
Patient ID: [name]., male    DOB: 1955-05-25, 67 y.o.   MRN: [id number]   Mr Memmer is a 67 y/o male with a history of atrial fibrillation, asthma, CAD, DM, hyperlipidemia, HTN, depression, gout, hypogonadism, PVD, COPD, pleurisy, sleep apnea, previous tobacco use and chronic heart failure.   Echo report from 10/05/21 reviewed and showed an EF of 55-60% along with mild LAE and mild MR. Echo report from 05/10/21 reviewed and showed an EF of 50-55% along with moderate LVH  LHC done 02/02/20 and showed: Mid RCA lesion is 90% stenosed. Prox LAD to Mid LAD lesion is 100% stenosed. Mid Cx lesion is 100% stenosed. Origin to Prox Graft lesion is 100% stenosed.  LM normal LAD-100% proxiimal LCx-100% mid RCA-90% mid  Was in the ED 03/27/22 due to abdominal pain. Dose of oral lasix given after CXR showed mild fluid.   He presents today for a follow up visit with a chief complaint of minimal fatigue with moderate exertion. Describes this as chronic in nature. Has associated head congestion along with this. Denies any difficulty sleeping, dizziness, abdominal distention, palpitations, pedal edema, chest pain, shortness of breath or cough.   Does have a wound on his right ankle from hitting it with the wheelchair. Currently has it wrapped with a inflated boot around it. Being seen by podiatry for this.   Past Medical History:  Diagnosis Date   Arrhythmia    atrial fibrillation   Asthma    CHF (congestive heart failure) (HCC)    COPD (chronic obstructive pulmonary disease) (HCC)    Coronary artery disease    Depression    Diabetes mellitus without complication (HCC)    Gout    History anabolic steroid use    Hyperlipidemia    Hypertension    Hypogonadism in male    Morbid obesity ([place])    Myocardial infarction ([place])    Peripheral vascular disease ([place])    Perirectal abscess    Pleurisy    Sleep apnea    CPAP at night, no oxygen   Varicella    Past Surgical History:   Procedure Laterality Date   ABDOMINAL AORTIC ANEURYSM REPAIR     ACHILLES TENDON SURGERY Left 01/10/2021   Procedure: ACHILLES LENGTHENING/KIDNER;  Surgeon: Caroline More, DPM;  Location: [facility];  Service: Podiatry;  Laterality: Left;   AMPUTATION Left 10/03/2021   Procedure: AMPUTATION BELOW KNEE;  Surgeon: Katha Cabal, MD;  Location: [facility];  Service: Vascular;  Laterality: Left;   AMPUTATION TOE Right 02/10/2016   Procedure: AMPUTATION TOE 3RD TOE;  Surgeon: Samara Deist, DPM;  Location: [facility];  Service: Podiatry;  Laterality: Right;   AMPUTATION TOE Left 02/24/2020   Procedure: AMPUTATION TOE;  Surgeon: Caroline More, DPM;  Location: [facility];  Service: Podiatry;  Laterality: Left;   APPLICATION OF WOUND VAC Left 02/29/2020   Procedure: APPLICATION OF WOUND VAC;  Surgeon: Caroline More, DPM;  Location: [facility];  Service: Podiatry;  Laterality: Left;   CARDIOVERSION [name] 02/13/2022   Procedure: CARDIOVERSION;  Surgeon: Corey Skains, MD;  Location: [facility];  Service: Cardiovascular;  Laterality: [name];   COLONOSCOPY WITH PROPOFOL [name] 11/18/2015   Procedure: COLONOSCOPY WITH PROPOFOL;  Surgeon: Manya Silvas, MD;  Location: [facility];  Service: Endoscopy;  Laterality: [name];   CORONARY ARTERY BYPASS GRAFT     CORONARY STENT INTERVENTION [name] 02/02/2020   Procedure: CORONARY STENT INTERVENTION;  Surgeon: Isaias Cowman, MD;  Location: [place] [facility];  Service: Cardiovascular;  Laterality: [name];   INCISION AND DRAINAGE Left 08/07/2021   Procedure: INCISION AND DRAINAGE-Partial Calcanectomy;  Surgeon: Caroline More, DPM;  Location: [facility];  Service: Podiatry;  Laterality: Left;   IRRIGATION AND DEBRIDEMENT FOOT Left 02/29/2020   Procedure: IRRIGATION AND DEBRIDEMENT FOOT;  Surgeon: Caroline More, DPM;  Location: [facility];  Service: Podiatry;  Laterality: Left;   IRRIGATION AND DEBRIDEMENT FOOT Left 02/24/2020   Procedure: IRRIGATION AND DEBRIDEMENT FOOT;  Surgeon:  Caroline More, DPM;  Location: [facility];  Service: Podiatry;  Laterality: Left;   KNEE ARTHROSCOPY     LEFT HEART CATH AND CORS/GRAFTS ANGIOGRAPHY [name] 02/02/2020   Procedure: LEFT HEART CATH AND CORS/GRAFTS ANGIOGRAPHY;  Surgeon: Teodoro Spray, MD;  Location: [place] [facility];  Service: Cardiovascular;  Laterality: [name];   LOWER EXTREMITY ANGIOGRAPHY Left 02/25/2020   Procedure: Lower Extremity Angiography;  Surgeon: Algernon Huxley, MD;  Location: [place] [facility];  Service: Cardiovascular;  Laterality: Left;   LOWER EXTREMITY ANGIOGRAPHY Left 01/04/2021   Procedure: LOWER EXTREMITY ANGIOGRAPHY;  Surgeon: Algernon Huxley, MD;  Location: [place] [facility];  Service: Cardiovascular;  Laterality: Left;   METATARSAL HEAD EXCISION Left 01/10/2021   Procedure: METATARSAL HEAD EXCISION - LEFT 5th;  Surgeon: Caroline More, DPM;  Location: [facility];  Service: Podiatry;  Laterality: Left;   PERIPHERAL VASCULAR CATHETERIZATION Right 01/24/2016   Procedure: Lower Extremity Angiography;  Surgeon: Katha Cabal, MD;  Location: [place] [facility];  Service: Cardiovascular;  Laterality: Right;   PERIPHERAL VASCULAR CATHETERIZATION Right 01/25/2016   Procedure: Lower Extremity Angiography;  Surgeon: Katha Cabal, MD;  Location: [place] [facility];  Service: Cardiovascular;  Laterality: Right;   TOE AMPUTATION     TONSILLECTOMY     Family History  Problem Relation Age of Onset   Hypertension Father    Coronary artery disease Father    Alcohol abuse Father    Heart failure Brother    Social History   Tobacco Use   Smoking status: Former    Packs/day: 0.50    Years: 45.00    Total pack years: 22.50    Types: Cigarettes    Quit date: 04/05/2015    Years since quitting: 7.1   Smokeless tobacco: Never  Substance Use Topics   Alcohol use: Not Currently    Alcohol/week: 3.0 standard drinks of alcohol    Types: 3 Glasses of wine per week   Allergies  Allergen Reactions   Penicillins  Other (See Comments)    Happened at 67 years old and pt. stated he passed out  He has tolerated amoxicillin/clavulanate and ampicillin/sulbactam   Statins     Other reaction(s): Muscle Pain Causes legs to ache per pt   Metformin And Related Diarrhea   Prior to Admission medications   Medication Sig Start Date End Date Taking? Authorizing Provider  acetaminophen (TYLENOL) 325 MG tablet Take 2 tablets (650 mg total) by mouth every 6 (six) hours as needed for mild pain (or Fever >/= 101). Patient taking differently: Take 650 mg by mouth every 4 (four) hours as needed for mild pain (or Fever >/= 101). 11/10/21  Yes Emeterio Reeve, DO  amiodarone (PACERONE) 200 MG tablet Take 200 mg by mouth daily.   Yes [provider]  apixaban (ELIQUIS) 5 MG TABS tablet Take 5 mg by mouth 2 (two) times daily.   Yes [provider]  aspirin EC 81 MG tablet Take 81 mg by  mouth daily. Swallow whole.   Yes [provider]  budesonide (PULMICORT) 0.5 MG/2ML nebulizer solution Take 2 mLs (0.5 mg total) by nebulization 2 (two) times daily. 11/10/21  Yes Emeterio Reeve, DO  Cholecalciferol 25 MCG (1000 UT) tablet Take 1,000 Units by mouth daily.   Yes [provider]  Cyanocobalamin (VITAMIN B-12) 5000 MCG TBDP Take 10,000 mcg by mouth daily.   Yes [provider]  diltiazem (TIAZAC) 300 MG 24 hr capsule Take 300 mg by mouth daily.   Yes [provider]  ezetimibe (ZETIA) 10 MG tablet Take 10 mg by mouth at bedtime.   Yes [provider]  ferrous sulfate 325 (65 FE) MG tablet Take 325 mg by mouth daily with breakfast.   Yes [provider]  finasteride (PROSCAR) 5 MG tablet Take 1 tablet (5 mg total) by mouth daily. 11/11/21  Yes Emeterio Reeve, DO  furosemide (LASIX) 20 MG tablet Take 2 tablets (40 mg total) by mouth daily. 02/15/22  Yes Fritzi Mandes, MD  gabapentin (NEURONTIN) 300 MG capsule Take 300 mg by mouth at bedtime.   Yes [provider]  HUMALOG 100 UNIT/ML injection Inject 15 Units into the skin 3 (three) times daily with meals. >200 02/05/22  Yes [provider]  insulin glargine-yfgn (SEMGLEE) 100 UNIT/ML injection Inject 0.25 mLs (25 Units total) into the skin at bedtime. Patient taking differently: Inject 40 Units into the skin every morning. 11/10/21  Yes Emeterio Reeve, DO  isosorbide mononitrate (IMDUR) 60 MG 24 hr tablet Take 60 mg by mouth in the morning and at bedtime.   Yes [provider]  levothyroxine (SYNTHROID) 25 MCG tablet Take 1 tablet (25 mcg total) by mouth daily at 6 (six) AM. 11/11/21  Yes Emeterio Reeve, DO  losartan (COZAAR) 50 MG tablet Take 50 mg by mouth daily.   Yes [provider]  Multiple Vitamins-Minerals (MULTIVITAMIN WITH MINERALS) tablet Take 1 tablet by mouth daily.   Yes [provider]  nitrofurantoin, macrocrystal-monohydrate, (MACROBID) 100 MG capsule Take 100 mg by mouth daily.   Yes [provider]  pramipexole (MIRAPEX) 1 MG tablet Take 2 mg by mouth at bedtime.   Yes [provider]  primidone (MYSOLINE) 250 MG tablet Take 250 mg by mouth 2 (two) times daily. 01/23/20  Yes [provider]  risperiDONE (RISPERDAL) 0.5 MG tablet Take 1 tablet (0.5 mg total) by mouth 2 (two) times daily. 11/10/21  Yes Emeterio Reeve, DO  tamsulosin (FLOMAX) 0.4 MG CAPS capsule Take 0.4 mg by mouth daily.   Yes [provider]  traMADol (ULTRAM) 50 MG tablet Take 1 tablet (50 mg total) by mouth daily as needed for moderate pain. 08/11/21  Yes Fritzi Mandes, MD  vitamin C (ASCORBIC ACID) 500 MG tablet Take 500 mg by mouth daily.   Yes [provider]  sulfamethoxazole-trimethoprim (BACTRIM DS) 800-160 MG tablet Take by mouth. Patient not taking: Reported on 05/10/2022 05/04/22 05/11/22  [provider]    Review of Systems  Constitutional:  Positive for fatigue. Negative for appetite change.  HENT:   Positive for congestion. Negative for postnasal drip and rhinorrhea.   Eyes: Negative.   Respiratory:  Negative for cough, chest tightness and shortness of breath.   Cardiovascular:  Negative for chest pain, palpitations and leg swelling.  Gastrointestinal:  Negative for abdominal distention and abdominal pain.  Endocrine: Negative.   Genitourinary: Negative.   Musculoskeletal:  Negative for arthralgias and back pain.  Skin:  Positive for wound (right ankle).  Allergic/Immunologic: Negative.   Neurological:  Negative for dizziness, tremors and light-headedness.  Hematological:  Negative for adenopathy. Bruises/bleeds easily.  Psychiatric/Behavioral:  Negative for dysphoric mood and sleep disturbance (wearing oxygen @ 2L; CPAP inconsistently). The patient is not nervous/anxious.    Vitals:   05/10/22 1314  BP: 122/62  Pulse: 91  Resp: 18  SpO2: 95%   Wt Readings from Last 3 Encounters:  04/05/22 (!) 303 lb (137.4 kg)  04/04/22 (!) 310 lb (140.6 kg)  03/26/22 (!) 310 lb (140.6 kg)   Lab Results  Component Value Date   CREATININE 0.99 03/26/2022   CREATININE 1.24 03/21/2022   CREATININE 1.31 (H) 03/20/2022   Physical Exam Vitals and nursing note reviewed.  Constitutional:      Appearance: Normal appearance. He is obese.  HENT:     Head: Normocephalic and atraumatic.  Cardiovascular:     Rate and Rhythm: Normal rate and regular rhythm.  Pulmonary:     Effort: No respiratory distress.     Breath sounds: No wheezing or rales.  Abdominal:     General: There is no distension.     Palpations: Abdomen is soft.     Tenderness: There is no abdominal tenderness.  Musculoskeletal:        General: Deformity (left BKA) present.     Cervical back: Normal range of motion.     Right lower leg: No edema.     Comments: Inflated boot around right foot/ ankle  Skin:    General: Skin is warm and dry.  Neurological:     General: No focal deficit present.     Mental Status: He is alert  and oriented to person, place, and time.  Psychiatric:        Mood and Affect: Mood normal.        Behavior: Behavior normal.        Thought Content: Thought content normal.   Assessment & Plan:  1: Chronic heart failure with preserved ejection fraction with structural changes (LAE)- - NYHA class II - euvolemic today - currently not weighing daily because he can't support himself on his right leg  - receiving PT/ OT multiple times a week - may not be a good candidate for SGLT2 due to body habitus and frequent fungal infections around abdominal folds and right lower leg - consider starting entresto/ MRA at future visits but need to make sure K+ level is lower - he has been using NoSalt on his food; advised to not use that anymore and use pepper, Mrs Deliah Boston, garlic powder etc on his food - wearing oxygen at 2L around the clock; wearing CPAP inconsistently - BNP 03/16/22 was 205.4  2: HTN- - BP 122/62 - saw PCP (Tumey) 04/25/22 - BMP 04/18/22 reviewed and showed sodium 136, potassium 5.3, creatinine 1.3 & GFR 60  3: DM- - saw endocrinology Honor Junes) 04/05/22 - A1c 03/15/22 was 8.9% - saw podiatry Luana Shu) 05/04/22 - home glucose today was 216  4: Atrial fibrillation- - saw cardiology (Fath) 02/28/22; getting [facility] cardiology Fletcher Anon) on 05/15/22 - regular rhythm today  5: PAD/ lymphedema- - had left BKA June 2023 - saw vascular Owens Shark) 04/18/22 - has wound on right foot & being seen by podiatry   Medication list reviewed.   Return in 3 months, sooner if needed.

## 2022-05-10 NOTE — Patient Instructions (Signed)
Try not to use any No Salt on your food

## 2022-05-11 ENCOUNTER — Encounter: Payer: Self-pay | Admitting: Family

## 2022-05-11 ENCOUNTER — Ambulatory Visit (INDEPENDENT_AMBULATORY_CARE_PROVIDER_SITE_OTHER): Payer: Self-pay | Admitting: Vascular Surgery

## 2022-05-11 ENCOUNTER — Encounter (INDEPENDENT_AMBULATORY_CARE_PROVIDER_SITE_OTHER): Payer: Self-pay

## 2022-05-11 ENCOUNTER — Other Ambulatory Visit (INDEPENDENT_AMBULATORY_CARE_PROVIDER_SITE_OTHER): Payer: Self-pay

## 2022-05-11 NOTE — Progress Notes (Signed)
[facility] - PHARMACIST COUNSELING NOTE  Guideline-Directed Medical Therapy/Evidence Based Medicine  ACE/ARB/ARNI: Losartan 50 mg daily Beta Blocker:  None Aldosterone Antagonist:  None Diuretic: Furosemide 40 mg daily SGLT2i:  None  Adherence Assessment  Do you ever forget to take your medication? '[]'$ Yes '[x]'$ No  Do you ever skip doses due to side effects? '[]'$ Yes '[x]'$ No  Do you have trouble affording your medicines? '[]'$ Yes '[x]'$ No  Are you ever unable to pick up your medication due to transportation difficulties? '[]'$ Yes '[x]'$ No  Do you ever stop taking your medications because you don't believe they are helping? '[]'$ Yes '[x]'$ No  Do you check your weight daily? '[]'$ Yes '[x]'$ No   Adherence strategy: Pillbox and wife helps with medications  Barriers to obtaining medications: None  Vital signs: HR 91, BP 122/62, weight (pounds) [name] ECHO: Date 09/2021, EF 55-60%     Latest Ref Rng & Units 03/26/2022    5:53 PM 03/21/2022    3:38 AM 03/20/2022    4:39 AM  BMP  Glucose 70 - 99 mg/dL 230  190  110   BUN 8 - 23 mg/dL '18  27  28   '$ Creatinine 0.61 - 1.24 mg/dL 0.99  1.24  1.31   Sodium 135 - 145 mmol/L 142  137  141   Potassium 3.5 - 5.1 mmol/L 3.7  3.7  3.7   Chloride 98 - 111 mmol/L 103  106  109   CO2 22 - 32 mmol/L '30  27  28   '$ Calcium 8.9 - 10.3 mg/dL 8.6  8.2  8.1     Past Medical History:  Diagnosis Date   Arrhythmia    atrial fibrillation   Asthma    CHF (congestive heart failure) (HCC)    COPD (chronic obstructive pulmonary disease) (HCC)    Coronary artery disease    Depression    Diabetes mellitus without complication (HCC)    Gout    History anabolic steroid use    Hyperlipidemia    Hypertension    Hypogonadism in male    Morbid obesity (HCC)    Myocardial infarction ([place])    Peripheral vascular disease ([place])    Perirectal abscess    Pleurisy    Sleep apnea    CPAP at night, no oxygen   Varicella     ASSESSMENT 67  year old male with PMH HTN, PAD, CAD, Afib, T2DM, BPH, h/o UTI who presents to the HF clinic for follow-up. Last ECHO in 09/2021 shows EF 55-60%. Regarding GDMT, patient is on losartan 50 mg daily. Patient is not candidate for SGLT2i due to body habitus and h/o UTI and fungal skin fold infections. Patient was in AKI during hospitalizations in November.  Recent ED Visit (past 6 months): Date - 02/2022, CC - AoCHF Date - 02/2022, CC - Sepsis Date - 01/2022, CC - Afib Date - 09/2021, CC - Cellulitis  PLAN Reconciled medication list which involved addition of several medications such as apixaban and losartan which have been started for him recently. Recommend considering initiation of MRA like spironolactone 12.5 mg daily once obtaining new labs, as last labs were in 02/2022 and while the K was on lower end of normal, would need to ensure potassium and serum creatinine now would still allow for MRA initiation. Patient has had several ED encounters within the past 6 months.   Time spent: 20 minutes  Will M. Ouida Sills, PharmD PGY-1 Pharmacy Resident 05/11/2022 7:43 AM    Current Outpatient  Medications:    acetaminophen (TYLENOL) 325 MG tablet, Take 2 tablets (650 mg total) by mouth every 6 (six) hours as needed for mild pain (or Fever >/= 101). (Patient taking differently: Take 650 mg by mouth every 4 (four) hours as needed for mild pain (or Fever >/= 101).), Disp: , Rfl:    amiodarone (PACERONE) 200 MG tablet, Take 200 mg by mouth daily., Disp: , Rfl:    apixaban (ELIQUIS) 5 MG TABS tablet, Take 5 mg by mouth 2 (two) times daily., Disp: , Rfl:    aspirin EC 81 MG tablet, Take 81 mg by mouth daily. Swallow whole., Disp: , Rfl:    budesonide (PULMICORT) 0.5 MG/2ML nebulizer solution, Take 2 mLs (0.5 mg total) by nebulization 2 (two) times daily., Disp: 120 mL, Rfl: 0   Cholecalciferol 25 MCG (1000 UT) tablet, Take 1,000 Units by mouth daily., Disp: , Rfl:    Cyanocobalamin (VITAMIN B-12) 5000 MCG  TBDP, Take 10,000 mcg by mouth daily., Disp: , Rfl:    diltiazem (TIAZAC) 300 MG 24 hr capsule, Take 300 mg by mouth daily., Disp: , Rfl:    ezetimibe (ZETIA) 10 MG tablet, Take 10 mg by mouth at bedtime., Disp: , Rfl:    ferrous sulfate 325 (65 FE) MG tablet, Take 325 mg by mouth daily with breakfast., Disp: , Rfl:    finasteride (PROSCAR) 5 MG tablet, Take 1 tablet (5 mg total) by mouth daily., Disp: 30 tablet, Rfl: 0   furosemide (LASIX) 20 MG tablet, Take 2 tablets (40 mg total) by mouth daily., Disp: 30 tablet, Rfl: 0   gabapentin (NEURONTIN) 300 MG capsule, Take 300 mg by mouth at bedtime., Disp: , Rfl:    HUMALOG 100 UNIT/ML injection, Inject 15 Units into the skin 3 (three) times daily with meals. >200, Disp: , Rfl:    insulin glargine-yfgn (SEMGLEE) 100 UNIT/ML injection, Inject 0.25 mLs (25 Units total) into the skin at bedtime. (Patient taking differently: Inject 40 Units into the skin every morning.), Disp: 10 mL, Rfl: 11   isosorbide mononitrate (IMDUR) 60 MG 24 hr tablet, Take 60 mg by mouth in the morning and at bedtime., Disp: , Rfl:    levothyroxine (SYNTHROID) 25 MCG tablet, Take 1 tablet (25 mcg total) by mouth daily at 6 (six) AM., Disp: 30 tablet, Rfl: 0   losartan (COZAAR) 50 MG tablet, Take 50 mg by mouth daily., Disp: , Rfl:    Multiple Vitamins-Minerals (MULTIVITAMIN WITH MINERALS) tablet, Take 1 tablet by mouth daily., Disp: , Rfl:    nitrofurantoin, macrocrystal-monohydrate, (MACROBID) 100 MG capsule, Take 100 mg by mouth daily., Disp: , Rfl:    pramipexole (MIRAPEX) 1 MG tablet, Take 2 mg by mouth at bedtime., Disp: , Rfl:    primidone (MYSOLINE) 250 MG tablet, Take 250 mg by mouth 2 (two) times daily., Disp: , Rfl:    risperiDONE (RISPERDAL) 0.5 MG tablet, Take 1 tablet (0.5 mg total) by mouth 2 (two) times daily., Disp: 60 tablet, Rfl: 0   sulfamethoxazole-trimethoprim (BACTRIM DS) 800-160 MG tablet, Take by mouth. (Patient not taking: Reported on 05/10/2022), Disp: ,  Rfl:    tamsulosin (FLOMAX) 0.4 MG CAPS capsule, Take 0.4 mg by mouth daily., Disp: , Rfl:    traMADol (ULTRAM) 50 MG tablet, Take 1 tablet (50 mg total) by mouth daily as needed for moderate pain., Disp: 10 tablet, Rfl: 0   vitamin C (ASCORBIC ACID) 500 MG tablet, Take 500 mg by mouth daily., Disp: , Rfl:  DRUGS TO CAUTION IN HEART FAILURE  Drug or Class Mechanism  Analgesics NSAIDs COX-2 inhibitors Glucocorticoids  Sodium and water retention, increased systemic vascular resistance, decreased response to diuretics   Diabetes Medications Metformin Thiazolidinediones Rosiglitazone (Avandia) Pioglitazone (Actos) DPP4 Inhibitors Saxagliptin (Onglyza) Sitagliptin (Januvia)   Lactic acidosis Possible calcium channel blockade   Unknown  Antiarrhythmics Class I  Flecainide Disopyramide Class III Sotalol Other Dronedarone  Negative inotrope, proarrhythmic   Proarrhythmic, beta blockade  Negative inotrope  Antihypertensives Alpha Blockers Doxazosin Calcium Channel Blockers Diltiazem Verapamil Nifedipine Central Alpha Adrenergics Moxonidine Peripheral Vasodilators Minoxidil  Increases renin and aldosterone  Negative inotrope    Possible sympathetic withdrawal  Unknown  Anti-infective Itraconazole Amphotericin B  Negative inotrope Unknown  Hematologic Anagrelide Cilostazol   Possible inhibition of PD IV Inhibition of PD III causing arrhythmias  Neurologic/Psychiatric Stimulants Anti-Seizure Drugs Carbamazepine Pregabalin Antidepressants Tricyclics Citalopram Parkinsons Bromocriptine Pergolide Pramipexole Antipsychotics Clozapine Antimigraine Ergotamine Methysergide Appetite suppressants Bipolar Lithium  Peripheral alpha and beta agonist activity  Negative inotrope and chronotrope Calcium channel blockade  Negative inotrope, proarrhythmic Dose-dependent QT prolongation  Excessive serotonin activity/valvular damage Excessive  serotonin activity/valvular damage Unknown  IgE mediated hypersensitivy, calcium channel blockade  Excessive serotonin activity/valvular damage Excessive serotonin activity/valvular damage Valvular damage  Direct myofibrillar degeneration, adrenergic stimulation  Antimalarials Chloroquine Hydroxychloroquine Intracellular inhibition of lysosomal enzymes  Urologic Agents Alpha Blockers Doxazosin Prazosin Tamsulosin Terazosin  Increased renin and aldosterone  Adapted from Page Carleene Overlie, et al. "Drugs That May Cause or Exacerbate Heart Failure: A Scientific Statement from the American Heart  Association." Circulation 2016; 134:e32-e69. DOI: 10.1161/CIR.0000000000000426   MEDICATION ADHERENCES TIPS AND STRATEGIES Taking medication as prescribed improves patient outcomes in heart failure (reduces hospitalizations, improves symptoms, increases survival) Side effects of medications can be managed by decreasing doses, switching agents, stopping drugs, or adding additional therapy. Please let someone in the [facility] know if you have having bothersome side effects so we can modify your regimen. Do not alter your medication regimen without talking to [place].  Medication reminders can help patients remember to take drugs on time. If you are missing or forgetting doses you can try linking behaviors, using pill boxes, or an electronic reminder like an alarm on your phone or an app. Some people can also get automated phone calls as medication reminders.

## 2022-05-14 DIAGNOSIS — I480 Paroxysmal atrial fibrillation: Secondary | ICD-10-CM | POA: Diagnosis not present

## 2022-05-14 DIAGNOSIS — N39498 Other specified urinary incontinence: Secondary | ICD-10-CM | POA: Diagnosis not present

## 2022-05-14 DIAGNOSIS — E1122 Type 2 diabetes mellitus with diabetic chronic kidney disease: Secondary | ICD-10-CM | POA: Diagnosis not present

## 2022-05-14 DIAGNOSIS — F339 Major depressive disorder, recurrent, unspecified: Secondary | ICD-10-CM | POA: Diagnosis not present

## 2022-05-14 DIAGNOSIS — I5032 Chronic diastolic (congestive) heart failure: Secondary | ICD-10-CM | POA: Diagnosis not present

## 2022-05-14 DIAGNOSIS — J9611 Chronic respiratory failure with hypoxia: Secondary | ICD-10-CM | POA: Diagnosis not present

## 2022-05-14 DIAGNOSIS — J4489 Other specified chronic obstructive pulmonary disease: Secondary | ICD-10-CM | POA: Diagnosis not present

## 2022-05-14 DIAGNOSIS — N401 Enlarged prostate with lower urinary tract symptoms: Secondary | ICD-10-CM | POA: Diagnosis not present

## 2022-05-14 DIAGNOSIS — D509 Iron deficiency anemia, unspecified: Secondary | ICD-10-CM | POA: Diagnosis not present

## 2022-05-14 DIAGNOSIS — I130 Hypertensive heart and chronic kidney disease with heart failure and stage 1 through stage 4 chronic kidney disease, or unspecified chronic kidney disease: Secondary | ICD-10-CM | POA: Diagnosis not present

## 2022-05-14 DIAGNOSIS — N189 Chronic kidney disease, unspecified: Secondary | ICD-10-CM | POA: Diagnosis not present

## 2022-05-14 DIAGNOSIS — G4733 Obstructive sleep apnea (adult) (pediatric): Secondary | ICD-10-CM | POA: Diagnosis not present

## 2022-05-14 DIAGNOSIS — I4892 Unspecified atrial flutter: Secondary | ICD-10-CM | POA: Diagnosis not present

## 2022-05-14 DIAGNOSIS — E1151 Type 2 diabetes mellitus with diabetic peripheral angiopathy without gangrene: Secondary | ICD-10-CM | POA: Diagnosis not present

## 2022-05-14 DIAGNOSIS — I2510 Atherosclerotic heart disease of native coronary artery without angina pectoris: Secondary | ICD-10-CM | POA: Diagnosis not present

## 2022-05-14 DIAGNOSIS — E1165 Type 2 diabetes mellitus with hyperglycemia: Secondary | ICD-10-CM | POA: Diagnosis not present

## 2022-05-15 ENCOUNTER — Other Ambulatory Visit: Payer: Medicare Other

## 2022-05-15 ENCOUNTER — Ambulatory Visit: Payer: Medicare Other | Attending: Cardiovascular Disease | Admitting: Cardiovascular Disease

## 2022-05-15 ENCOUNTER — Encounter: Payer: Self-pay | Admitting: Cardiovascular Disease

## 2022-05-15 VITALS — BP 118/70 | HR 106 | Ht 76.5 in

## 2022-05-15 DIAGNOSIS — I739 Peripheral vascular disease, unspecified: Secondary | ICD-10-CM | POA: Diagnosis not present

## 2022-05-15 DIAGNOSIS — I480 Paroxysmal atrial fibrillation: Secondary | ICD-10-CM

## 2022-05-15 DIAGNOSIS — I5032 Chronic diastolic (congestive) heart failure: Secondary | ICD-10-CM | POA: Diagnosis not present

## 2022-05-15 DIAGNOSIS — I130 Hypertensive heart and chronic kidney disease with heart failure and stage 1 through stage 4 chronic kidney disease, or unspecified chronic kidney disease: Secondary | ICD-10-CM | POA: Diagnosis not present

## 2022-05-15 DIAGNOSIS — E785 Hyperlipidemia, unspecified: Secondary | ICD-10-CM

## 2022-05-15 DIAGNOSIS — I4892 Unspecified atrial flutter: Secondary | ICD-10-CM | POA: Diagnosis not present

## 2022-05-15 DIAGNOSIS — N401 Enlarged prostate with lower urinary tract symptoms: Secondary | ICD-10-CM | POA: Diagnosis not present

## 2022-05-15 DIAGNOSIS — I10 Essential (primary) hypertension: Secondary | ICD-10-CM

## 2022-05-15 DIAGNOSIS — I25118 Atherosclerotic heart disease of native coronary artery with other forms of angina pectoris: Secondary | ICD-10-CM | POA: Diagnosis not present

## 2022-05-15 DIAGNOSIS — E1165 Type 2 diabetes mellitus with hyperglycemia: Secondary | ICD-10-CM | POA: Diagnosis not present

## 2022-05-15 DIAGNOSIS — D509 Iron deficiency anemia, unspecified: Secondary | ICD-10-CM | POA: Diagnosis not present

## 2022-05-15 DIAGNOSIS — J4489 Other specified chronic obstructive pulmonary disease: Secondary | ICD-10-CM | POA: Diagnosis not present

## 2022-05-15 DIAGNOSIS — E1151 Type 2 diabetes mellitus with diabetic peripheral angiopathy without gangrene: Secondary | ICD-10-CM | POA: Diagnosis not present

## 2022-05-15 DIAGNOSIS — G4733 Obstructive sleep apnea (adult) (pediatric): Secondary | ICD-10-CM | POA: Diagnosis not present

## 2022-05-15 DIAGNOSIS — S37019A Minor contusion of unspecified kidney, initial encounter: Secondary | ICD-10-CM

## 2022-05-15 DIAGNOSIS — F339 Major depressive disorder, recurrent, unspecified: Secondary | ICD-10-CM | POA: Diagnosis not present

## 2022-05-15 DIAGNOSIS — N189 Chronic kidney disease, unspecified: Secondary | ICD-10-CM | POA: Diagnosis not present

## 2022-05-15 DIAGNOSIS — N39498 Other specified urinary incontinence: Secondary | ICD-10-CM | POA: Diagnosis not present

## 2022-05-15 DIAGNOSIS — J9611 Chronic respiratory failure with hypoxia: Secondary | ICD-10-CM | POA: Diagnosis not present

## 2022-05-15 DIAGNOSIS — E1122 Type 2 diabetes mellitus with diabetic chronic kidney disease: Secondary | ICD-10-CM | POA: Diagnosis not present

## 2022-05-15 DIAGNOSIS — I2510 Atherosclerotic heart disease of native coronary artery without angina pectoris: Secondary | ICD-10-CM | POA: Diagnosis not present

## 2022-05-15 NOTE — Progress Notes (Signed)
Cardiology Office Note   Date:  05/15/2022   ID:  Alec Less., DOB Jul 13, 1955, MRN [id number]  PCP:  [name], MD  Cardiologist:   Kathlyn Sacramento, MD   Chief Complaint  Patient presents with   Other    CAD no complaints today. Meds reviewed verbally with pt.      History of Present Illness: Alec Sturdevant. is a 66 y.o. male who was referred by Dr. Ubaldo Glassing to establish cardiovascular care with me. He has known history of coronary artery disease status post CABG, atrial fibrillation, peripheral arterial disease followed by vascular surgery, diabetes mellitus, essential hypertension, hyperlipidemia, morbid obesity and sleep apnea on BiPAP. He had remote CABG at [facility] in early 2000.  Most recent cardiac catheterization was done in 2021 and showed occluded mid LAD and mid left circumflex with severe stenosis in the mid right coronary artery.  LIMA to LAD was patent but the SVG grafts were occluded.  He underwent PCI and drug-eluting stent placement to the mid right coronary artery.  He has known history of morbid obesity and at some point his weight was 600 pounds.  He was able to lose weight with improved lifestyle changes.  He has history of paroxysmal atrial fibrillation on long-term anticoagulation with Eliquis. Most recent echocardiogram in June 2023 showed normal LV systolic function with mild biatrial enlargement and no significant valvular abnormalities.  He has peripheral arterial disease followed by vascular surgery with multiple interventions on the lower extremities.  He is status post left BKA.  He has been doing reasonably well and denies chest pain or worsening dyspnea.  No palpitations or dizziness.  He takes his medications regularly.    Past Medical History:  Diagnosis Date   Arrhythmia    atrial fibrillation   Asthma    CHF (congestive heart failure) (HCC)    COPD (chronic obstructive pulmonary disease) (HCC)    Coronary artery disease     Depression    Diabetes mellitus without complication (HCC)    Gout    History anabolic steroid use    Hyperlipidemia    Hypertension    Hypogonadism in male    MI (myocardial infarction) ([place])    Morbid obesity ([place])    Myocardial infarction ([place])    Peripheral vascular disease ([place])    Perirectal abscess    Pleurisy    Sleep apnea    CPAP at night, no oxygen   Varicella     Past Surgical History:  Procedure Laterality Date   ABDOMINAL AORTIC ANEURYSM REPAIR     ACHILLES TENDON SURGERY Left 01/10/2021   Procedure: ACHILLES LENGTHENING/KIDNER;  Surgeon: Caroline More, DPM;  Location: [facility];  Service: Podiatry;  Laterality: Left;   AMPUTATION Left 10/03/2021   Procedure: AMPUTATION BELOW KNEE;  Surgeon: Katha Cabal, MD;  Location: [facility];  Service: Vascular;  Laterality: Left;   AMPUTATION TOE Right 02/10/2016   Procedure: AMPUTATION TOE 3RD TOE;  Surgeon: Samara Deist, DPM;  Location: [facility];  Service: Podiatry;  Laterality: Right;   AMPUTATION TOE Left 02/24/2020   Procedure: AMPUTATION TOE;  Surgeon: Caroline More, DPM;  Location: [facility];  Service: Podiatry;  Laterality: Left;   APPLICATION OF WOUND VAC Left 02/29/2020   Procedure: APPLICATION OF WOUND VAC;  Surgeon: Caroline More, DPM;  Location: [facility];  Service: Podiatry;  Laterality: Left;   CARDIAC CATHETERIZATION     CARDIOVERSION [name] 02/13/2022   Procedure: CARDIOVERSION;  Surgeon: Nehemiah Massed,  Everlean Cherry, MD;  Location: [facility];  Service: Cardiovascular;  Laterality: [name];   COLONOSCOPY WITH PROPOFOL [name] 11/18/2015   Procedure: COLONOSCOPY WITH PROPOFOL;  Surgeon: Manya Silvas, MD;  Location: [facility];  Service: Endoscopy;  Laterality: [name];   CORONARY ARTERY BYPASS GRAFT     CORONARY STENT INTERVENTION [name] 02/02/2020   Procedure: CORONARY STENT INTERVENTION;  Surgeon: Isaias Cowman, MD;  Location: [place] [facility];  Service: Cardiovascular;  Laterality: [name];   INCISION AND DRAINAGE  Left 08/07/2021   Procedure: INCISION AND DRAINAGE-Partial Calcanectomy;  Surgeon: Caroline More, DPM;  Location: [facility];  Service: Podiatry;  Laterality: Left;   IRRIGATION AND DEBRIDEMENT FOOT Left 02/29/2020   Procedure: IRRIGATION AND DEBRIDEMENT FOOT;  Surgeon: Caroline More, DPM;  Location: [facility];  Service: Podiatry;  Laterality: Left;   IRRIGATION AND DEBRIDEMENT FOOT Left 02/24/2020   Procedure: IRRIGATION AND DEBRIDEMENT FOOT;  Surgeon: Caroline More, DPM;  Location: [facility];  Service: Podiatry;  Laterality: Left;   KNEE ARTHROSCOPY     LEFT HEART CATH AND CORS/GRAFTS ANGIOGRAPHY [name] 02/02/2020   Procedure: LEFT HEART CATH AND CORS/GRAFTS ANGIOGRAPHY;  Surgeon: Teodoro Spray, MD;  Location: [place] [facility];  Service: Cardiovascular;  Laterality: [name];   LOWER EXTREMITY ANGIOGRAPHY Left 02/25/2020   Procedure: Lower Extremity Angiography;  Surgeon: Algernon Huxley, MD;  Location: [place] [facility];  Service: Cardiovascular;  Laterality: Left;   LOWER EXTREMITY ANGIOGRAPHY Left 01/04/2021   Procedure: LOWER EXTREMITY ANGIOGRAPHY;  Surgeon: Algernon Huxley, MD;  Location: [place] [facility];  Service: Cardiovascular;  Laterality: Left;   METATARSAL HEAD EXCISION Left 01/10/2021   Procedure: METATARSAL HEAD EXCISION - LEFT 5th;  Surgeon: Caroline More, DPM;  Location: [facility];  Service: Podiatry;  Laterality: Left;   PERIPHERAL VASCULAR CATHETERIZATION Right 01/24/2016   Procedure: Lower Extremity Angiography;  Surgeon: Katha Cabal, MD;  Location: [place] [facility];  Service: Cardiovascular;  Laterality: Right;   PERIPHERAL VASCULAR CATHETERIZATION Right 01/25/2016   Procedure: Lower Extremity Angiography;  Surgeon: Katha Cabal, MD;  Location: [place] [facility];  Service: Cardiovascular;  Laterality: Right;   TOE AMPUTATION     TONSILLECTOMY       Current Outpatient Medications  Medication Sig Dispense Refill   acetaminophen (TYLENOL) 325 MG tablet Take 2  tablets (650 mg total) by mouth every 6 (six) hours as needed for mild pain (or Fever >/= 101). (Patient taking differently: Take 650 mg by mouth every 4 (four) hours as needed for mild pain (or Fever >/= 101).)     amiodarone (PACERONE) 200 MG tablet Take 200 mg by mouth daily.     apixaban (ELIQUIS) 5 MG TABS tablet Take 5 mg by mouth 2 (two) times daily.     aspirin EC 81 MG tablet Take 81 mg by mouth daily. Swallow whole.     budesonide (PULMICORT) 0.5 MG/2ML nebulizer solution Take 2 mLs (0.5 mg total) by nebulization 2 (two) times daily. 120 mL 0   Cholecalciferol 25 MCG (1000 UT) tablet Take 1,000 Units by mouth daily.     Cyanocobalamin (VITAMIN B-12) 5000 MCG TBDP Take 10,000 mcg by mouth daily.     diltiazem (TIAZAC) 300 MG 24 hr capsule Take 300 mg by mouth daily.     ezetimibe (ZETIA) 10 MG tablet Take 10 mg by mouth at bedtime.     ferrous sulfate 325 (65 FE) MG tablet Take 325 mg by mouth daily with breakfast.  finasteride (PROSCAR) 5 MG tablet Take 1 tablet (5 mg total) by mouth daily. 30 tablet 0   furosemide (LASIX) 20 MG tablet Take 2 tablets (40 mg total) by mouth daily. 30 tablet 0   gabapentin (NEURONTIN) 300 MG capsule Take 300 mg by mouth at bedtime.     HUMALOG 100 UNIT/ML injection Inject 15 Units into the skin 3 (three) times daily with meals. >200     insulin glargine-yfgn (SEMGLEE) 100 UNIT/ML injection Inject 0.25 mLs (25 Units total) into the skin at bedtime. (Patient taking differently: Inject 40 Units into the skin every morning.) 10 mL 11   isosorbide mononitrate (IMDUR) 60 MG 24 hr tablet Take 60 mg by mouth in the morning and at bedtime.     levothyroxine (SYNTHROID) 25 MCG tablet Take 1 tablet (25 mcg total) by mouth daily at 6 (six) AM. 30 tablet 0   losartan (COZAAR) 50 MG tablet Take 50 mg by mouth daily.     Multiple Vitamins-Minerals (MULTIVITAMIN WITH MINERALS) tablet Take 1 tablet by mouth daily.     nitrofurantoin, macrocrystal-monohydrate, (MACROBID)  100 MG capsule Take 100 mg by mouth daily.     pramipexole (MIRAPEX) 1 MG tablet Take 2 mg by mouth at bedtime.     primidone (MYSOLINE) 250 MG tablet Take 250 mg by mouth 2 (two) times daily.     risperiDONE (RISPERDAL) 0.5 MG tablet Take 1 tablet (0.5 mg total) by mouth 2 (two) times daily. 60 tablet 0   tamsulosin (FLOMAX) 0.4 MG CAPS capsule Take 0.4 mg by mouth daily.     traMADol (ULTRAM) 50 MG tablet Take 1 tablet (50 mg total) by mouth daily as needed for moderate pain. 10 tablet 0   vitamin C (ASCORBIC ACID) 500 MG tablet Take 500 mg by mouth daily.     No current facility-administered medications for this visit.    Allergies:   Penicillins, Statins, and Metformin and related    Social History:  The patient  reports that he quit smoking about 7 years ago. His smoking use included cigarettes. He has a 22.50 pack-year smoking history. He has never used smokeless tobacco. He reports that he does not currently use alcohol after a past usage of about 3.0 standard drinks of alcohol per week. He reports that he does not use drugs.   Family History:  The patient's family history includes Alcohol abuse in his father; Coronary artery disease in his father; Heart failure in his brother; Hypertension in his father.    ROS:  Please see the history of present illness.   Otherwise, review of systems are positive for none.   All other systems are reviewed and negative.    PHYSICAL EXAM: VS:  BP 118/70 (BP Location: Right Arm, Patient Position: Sitting, Cuff Size: Large)   Pulse (!) 106   Ht 6' 4.5" (1.943 m)   SpO2 95%   BMI 36.40 kg/m  , BMI Body mass index is 36.4 kg/m. GEN: Well nourished, well developed, in no acute distress  HEENT: normal  Neck: no JVD, carotid bruits, or masses Cardiac: RRR; no murmurs, rubs, or gallops,no edema  Respiratory:  clear to auscultation bilaterally, normal work of breathing GI: soft, nontender, nondistended, + BS MS: no deformity or atrophy  Skin: warm  and dry, no rash Neuro:  Strength and sensation are intact Psych: euthymic mood, full affect   EKG:  EKG is ordered today. The ekg ordered today demonstrates sinus tachycardia with right bundle branch block  and left anterior fascicular block.   Recent Labs: 02/11/2022: TSH 2.140 03/16/2022: B Natriuretic Peptide 205.4 03/19/2022: Magnesium 1.9 03/26/2022: ALT 12; BUN 18; Creatinine, Ser 0.99; Hemoglobin 10.0; Platelets 315; Potassium 3.7; Sodium 142    Lipid Panel No results found for: "CHOL", "TRIG", "HDL", "CHOLHDL", "VLDL", "LDLCALC", "LDLDIRECT"    Wt Readings from Last 3 Encounters:  04/05/22 (!) 303 lb (137.4 kg)  04/04/22 (!) 310 lb (140.6 kg)  03/26/22 (!) 310 lb (140.6 kg)          05/15/2022    3:08 PM  PAD Screen  Previous PAD dx? Yes  Previous surgical procedure? Yes  Pain with walking? No  Feet/toe relief with dangling? No  Painful, non-healing ulcers? No  Extremities discolored? No      ASSESSMENT AND PLAN:  1.  Coronary artery disease involving native coronary arteries with other forms of angina: He is doing reasonably well overall.  His symptoms are controlled with diltiazem and Imdur.  Most recent PCI was in 2021 on the right coronary artery.  His LIMA to LAD was patent but SVG grafts were occluded.  Left circumflex is occluded with no patent vein graft.  Continue aggressive medical therapy.  Given the presence of coronary artery disease and peripheral arterial disease, I elected to keep him on aspirin even though he is on anticoagulation with Eliquis.  2.  Paroxysmal atrial fibrillation: Currently in sinus rhythm on amiodarone and diltiazem.  He is on long-term anticoagulation with Eliquis.  3.  Chronic diastolic heart failure: He appears to be euvolemic on current dose of furosemide.  Consider an SGLT2 inhibitor but he is likely at increased risk of UTIs given his obesity.  4.  Peripheral arterial disease: Followed by vascular surgery.  Status post  left BKA.  5.  Hyperlipidemia: Intolerance to statins due to myalgia.  He is currently on Zetia 10 mg once daily.  Most recent lipid profile in November showed an LDL of 104.  We could consider Nexlizet or PCSK9 inhibitor.  6.  Essential hypertension: Blood pressure is controlled.    Disposition:   FU with me in 6 months  Signed,  Kathlyn Sacramento, MD  05/15/2022 3:21 PM    [place]

## 2022-05-15 NOTE — Patient Instructions (Signed)
Medication Instructions:  No changes *If you need a refill on your cardiac medications before your next appointment, please call your pharmacy*   Lab Work: None ordered If you have labs (blood work) drawn today and your tests are completely normal, you will receive your results only by: MyChart Message (if you have MyChart) OR A paper copy in the mail If you have any lab test that is abnormal or we need to change your treatment, we will call you to review the results.   Testing/Procedures: None ordered   Follow-Up: At [facility], you and your health needs are our priority.  As part of our continuing mission to provide you with exceptional heart care, we have created designated Provider Care Teams.  These Care Teams include your primary Cardiologist (physician) and Advanced Practice Providers (APPs -  Physician Assistants and Nurse Practitioners) who all work together to provide you with the care you need, when you need it.  We recommend signing up for the patient portal called "MyChart".  Sign up information is provided on this After Visit Summary.  MyChart is used to connect with patients for Virtual Visits (Telemedicine).  Patients are able to view lab/test results, encounter notes, upcoming appointments, etc.  Non-urgent messages can be sent to your provider as well.   To learn more about what you can do with MyChart, go to [email or web address].    Your next appointment:   6 month(s)  Provider:   You may see Dr. Arida or one of the following Advanced Practice Providers on your designated Care Team:   Christopher Berge, NP Ryan Dunn, PA-C Cadence Furth, PA-C [name] Hammock, NP    

## 2022-05-16 ENCOUNTER — Telehealth (INDEPENDENT_AMBULATORY_CARE_PROVIDER_SITE_OTHER): Payer: Self-pay

## 2022-05-16 ENCOUNTER — Other Ambulatory Visit: Payer: Medicare Other

## 2022-05-16 DIAGNOSIS — I130 Hypertensive heart and chronic kidney disease with heart failure and stage 1 through stage 4 chronic kidney disease, or unspecified chronic kidney disease: Secondary | ICD-10-CM | POA: Diagnosis not present

## 2022-05-16 DIAGNOSIS — N189 Chronic kidney disease, unspecified: Secondary | ICD-10-CM | POA: Diagnosis not present

## 2022-05-16 DIAGNOSIS — F339 Major depressive disorder, recurrent, unspecified: Secondary | ICD-10-CM | POA: Diagnosis not present

## 2022-05-16 DIAGNOSIS — D509 Iron deficiency anemia, unspecified: Secondary | ICD-10-CM | POA: Diagnosis not present

## 2022-05-16 DIAGNOSIS — G4733 Obstructive sleep apnea (adult) (pediatric): Secondary | ICD-10-CM | POA: Diagnosis not present

## 2022-05-16 DIAGNOSIS — N39498 Other specified urinary incontinence: Secondary | ICD-10-CM | POA: Diagnosis not present

## 2022-05-16 DIAGNOSIS — I2510 Atherosclerotic heart disease of native coronary artery without angina pectoris: Secondary | ICD-10-CM | POA: Diagnosis not present

## 2022-05-16 DIAGNOSIS — I4892 Unspecified atrial flutter: Secondary | ICD-10-CM | POA: Diagnosis not present

## 2022-05-16 DIAGNOSIS — I480 Paroxysmal atrial fibrillation: Secondary | ICD-10-CM | POA: Diagnosis not present

## 2022-05-16 DIAGNOSIS — J4489 Other specified chronic obstructive pulmonary disease: Secondary | ICD-10-CM | POA: Diagnosis not present

## 2022-05-16 DIAGNOSIS — E1122 Type 2 diabetes mellitus with diabetic chronic kidney disease: Secondary | ICD-10-CM | POA: Diagnosis not present

## 2022-05-16 DIAGNOSIS — I5032 Chronic diastolic (congestive) heart failure: Secondary | ICD-10-CM | POA: Diagnosis not present

## 2022-05-16 DIAGNOSIS — E1151 Type 2 diabetes mellitus with diabetic peripheral angiopathy without gangrene: Secondary | ICD-10-CM | POA: Diagnosis not present

## 2022-05-16 DIAGNOSIS — N401 Enlarged prostate with lower urinary tract symptoms: Secondary | ICD-10-CM | POA: Diagnosis not present

## 2022-05-16 DIAGNOSIS — J9611 Chronic respiratory failure with hypoxia: Secondary | ICD-10-CM | POA: Diagnosis not present

## 2022-05-16 DIAGNOSIS — E1165 Type 2 diabetes mellitus with hyperglycemia: Secondary | ICD-10-CM | POA: Diagnosis not present

## 2022-05-16 LAB — CBC
Hematocrit: 33.4 % — ABNORMAL LOW (ref 37.5–51.0)
Hemoglobin: 10.7 g/dL — ABNORMAL LOW (ref 13.0–17.7)
MCH: 28.5 pg (ref 26.6–33.0)
MCHC: 32 g/dL (ref 31.5–35.7)
MCV: 89 fL (ref 79–97)
Platelets: 394 10*3/uL (ref 150–450)
RBC: 3.76 x10E6/uL — ABNORMAL LOW (ref 4.14–5.80)
RDW: 12.5 % (ref 11.6–15.4)
WBC: 12.2 10*3/uL — ABNORMAL HIGH (ref 3.4–10.8)

## 2022-05-16 NOTE — Telephone Encounter (Signed)
Spoke with the patient's spouse and he is schedule with Dr. Delana Meyer on 05/29/22 for a RLE angio [facility]. Pre-procedure instructions were discussed and will be mailed.

## 2022-05-17 DIAGNOSIS — D509 Iron deficiency anemia, unspecified: Secondary | ICD-10-CM | POA: Diagnosis not present

## 2022-05-17 DIAGNOSIS — I4892 Unspecified atrial flutter: Secondary | ICD-10-CM | POA: Diagnosis not present

## 2022-05-17 DIAGNOSIS — E1165 Type 2 diabetes mellitus with hyperglycemia: Secondary | ICD-10-CM | POA: Diagnosis not present

## 2022-05-17 DIAGNOSIS — N401 Enlarged prostate with lower urinary tract symptoms: Secondary | ICD-10-CM | POA: Diagnosis not present

## 2022-05-17 DIAGNOSIS — N39498 Other specified urinary incontinence: Secondary | ICD-10-CM | POA: Diagnosis not present

## 2022-05-17 DIAGNOSIS — I480 Paroxysmal atrial fibrillation: Secondary | ICD-10-CM | POA: Diagnosis not present

## 2022-05-17 DIAGNOSIS — E1122 Type 2 diabetes mellitus with diabetic chronic kidney disease: Secondary | ICD-10-CM | POA: Diagnosis not present

## 2022-05-17 DIAGNOSIS — J9611 Chronic respiratory failure with hypoxia: Secondary | ICD-10-CM | POA: Diagnosis not present

## 2022-05-17 DIAGNOSIS — E1151 Type 2 diabetes mellitus with diabetic peripheral angiopathy without gangrene: Secondary | ICD-10-CM | POA: Diagnosis not present

## 2022-05-17 DIAGNOSIS — J4489 Other specified chronic obstructive pulmonary disease: Secondary | ICD-10-CM | POA: Diagnosis not present

## 2022-05-17 DIAGNOSIS — N189 Chronic kidney disease, unspecified: Secondary | ICD-10-CM | POA: Diagnosis not present

## 2022-05-17 DIAGNOSIS — F339 Major depressive disorder, recurrent, unspecified: Secondary | ICD-10-CM | POA: Diagnosis not present

## 2022-05-17 DIAGNOSIS — I130 Hypertensive heart and chronic kidney disease with heart failure and stage 1 through stage 4 chronic kidney disease, or unspecified chronic kidney disease: Secondary | ICD-10-CM | POA: Diagnosis not present

## 2022-05-17 DIAGNOSIS — I5032 Chronic diastolic (congestive) heart failure: Secondary | ICD-10-CM | POA: Diagnosis not present

## 2022-05-17 DIAGNOSIS — I2510 Atherosclerotic heart disease of native coronary artery without angina pectoris: Secondary | ICD-10-CM | POA: Diagnosis not present

## 2022-05-17 DIAGNOSIS — G4733 Obstructive sleep apnea (adult) (pediatric): Secondary | ICD-10-CM | POA: Diagnosis not present

## 2022-05-18 ENCOUNTER — Encounter: Payer: Self-pay | Admitting: Emergency Medicine

## 2022-05-18 ENCOUNTER — Emergency Department: Payer: Medicare Other

## 2022-05-18 ENCOUNTER — Inpatient Hospital Stay
Admission: EM | Admit: 2022-05-18 | Discharge: 2022-06-06 | DRG: 617 | Disposition: A | Discharge status: Home Health | Payer: Medicare Other | Source: Ambulatory Visit | Attending: Internal Medicine | Admitting: Internal Medicine

## 2022-05-18 ENCOUNTER — Other Ambulatory Visit: Payer: Self-pay

## 2022-05-18 ENCOUNTER — Inpatient Hospital Stay: Payer: Medicare Other

## 2022-05-18 DIAGNOSIS — I110 Hypertensive heart disease with heart failure: Secondary | ICD-10-CM | POA: Diagnosis present

## 2022-05-18 DIAGNOSIS — M6281 Muscle weakness (generalized): Secondary | ICD-10-CM | POA: Diagnosis not present

## 2022-05-18 DIAGNOSIS — E1165 Type 2 diabetes mellitus with hyperglycemia: Secondary | ICD-10-CM

## 2022-05-18 DIAGNOSIS — Z899 Acquired absence of limb, unspecified: Secondary | ICD-10-CM

## 2022-05-18 DIAGNOSIS — I480 Paroxysmal atrial fibrillation: Secondary | ICD-10-CM | POA: Diagnosis present

## 2022-05-18 DIAGNOSIS — Z8249 Family history of ischemic heart disease and other diseases of the circulatory system: Secondary | ICD-10-CM

## 2022-05-18 DIAGNOSIS — Z89512 Acquired absence of left leg below knee: Secondary | ICD-10-CM

## 2022-05-18 DIAGNOSIS — Z7951 Long term (current) use of inhaled steroids: Secondary | ICD-10-CM

## 2022-05-18 DIAGNOSIS — E785 Hyperlipidemia, unspecified: Secondary | ICD-10-CM | POA: Diagnosis present

## 2022-05-18 DIAGNOSIS — L97319 Non-pressure chronic ulcer of right ankle with unspecified severity: Secondary | ICD-10-CM | POA: Diagnosis not present

## 2022-05-18 DIAGNOSIS — E871 Hypo-osmolality and hyponatremia: Secondary | ICD-10-CM | POA: Diagnosis present

## 2022-05-18 DIAGNOSIS — I5032 Chronic diastolic (congestive) heart failure: Secondary | ICD-10-CM | POA: Diagnosis not present

## 2022-05-18 DIAGNOSIS — Z951 Presence of aortocoronary bypass graft: Secondary | ICD-10-CM

## 2022-05-18 DIAGNOSIS — E875 Hyperkalemia: Principal | ICD-10-CM | POA: Diagnosis present

## 2022-05-18 DIAGNOSIS — M86171 Other acute osteomyelitis, right ankle and foot: Secondary | ICD-10-CM | POA: Insufficient documentation

## 2022-05-18 DIAGNOSIS — E039 Hypothyroidism, unspecified: Secondary | ICD-10-CM | POA: Diagnosis not present

## 2022-05-18 DIAGNOSIS — Z794 Long term (current) use of insulin: Secondary | ICD-10-CM | POA: Diagnosis not present

## 2022-05-18 DIAGNOSIS — J9611 Chronic respiratory failure with hypoxia: Secondary | ICD-10-CM | POA: Diagnosis not present

## 2022-05-18 DIAGNOSIS — G4733 Obstructive sleep apnea (adult) (pediatric): Secondary | ICD-10-CM | POA: Diagnosis not present

## 2022-05-18 DIAGNOSIS — D638 Anemia in other chronic diseases classified elsewhere: Secondary | ICD-10-CM | POA: Diagnosis not present

## 2022-05-18 DIAGNOSIS — I96 Gangrene, not elsewhere classified: Secondary | ICD-10-CM | POA: Diagnosis not present

## 2022-05-18 DIAGNOSIS — E1169 Type 2 diabetes mellitus with other specified complication: Principal | ICD-10-CM | POA: Diagnosis present

## 2022-05-18 DIAGNOSIS — E1152 Type 2 diabetes mellitus with diabetic peripheral angiopathy with gangrene: Secondary | ICD-10-CM | POA: Diagnosis not present

## 2022-05-18 DIAGNOSIS — Z79890 Hormone replacement therapy (postmenopausal): Secondary | ICD-10-CM

## 2022-05-18 DIAGNOSIS — N179 Acute kidney failure, unspecified: Secondary | ICD-10-CM | POA: Diagnosis present

## 2022-05-18 DIAGNOSIS — L97312 Non-pressure chronic ulcer of right ankle with fat layer exposed: Secondary | ICD-10-CM | POA: Insufficient documentation

## 2022-05-18 DIAGNOSIS — I771 Stricture of artery: Secondary | ICD-10-CM | POA: Diagnosis not present

## 2022-05-18 DIAGNOSIS — E669 Obesity, unspecified: Secondary | ICD-10-CM | POA: Diagnosis present

## 2022-05-18 DIAGNOSIS — I252 Old myocardial infarction: Secondary | ICD-10-CM

## 2022-05-18 DIAGNOSIS — Z79899 Other long term (current) drug therapy: Secondary | ICD-10-CM

## 2022-05-18 DIAGNOSIS — Z89511 Acquired absence of right leg below knee: Secondary | ICD-10-CM

## 2022-05-18 DIAGNOSIS — Z7982 Long term (current) use of aspirin: Secondary | ICD-10-CM

## 2022-05-18 DIAGNOSIS — Z880 Allergy status to penicillin: Secondary | ICD-10-CM

## 2022-05-18 DIAGNOSIS — L97514 Non-pressure chronic ulcer of other part of right foot with necrosis of bone: Secondary | ICD-10-CM | POA: Diagnosis not present

## 2022-05-18 DIAGNOSIS — M86161 Other acute osteomyelitis, right tibia and fibula: Secondary | ICD-10-CM | POA: Diagnosis not present

## 2022-05-18 DIAGNOSIS — N400 Enlarged prostate without lower urinary tract symptoms: Secondary | ICD-10-CM | POA: Diagnosis present

## 2022-05-18 DIAGNOSIS — M25471 Effusion, right ankle: Secondary | ICD-10-CM | POA: Diagnosis not present

## 2022-05-18 DIAGNOSIS — L97519 Non-pressure chronic ulcer of other part of right foot with unspecified severity: Secondary | ICD-10-CM | POA: Diagnosis present

## 2022-05-18 DIAGNOSIS — Z955 Presence of coronary angioplasty implant and graft: Secondary | ICD-10-CM

## 2022-05-18 DIAGNOSIS — E11649 Type 2 diabetes mellitus with hypoglycemia without coma: Secondary | ICD-10-CM | POA: Diagnosis present

## 2022-05-18 DIAGNOSIS — Z87891 Personal history of nicotine dependence: Secondary | ICD-10-CM

## 2022-05-18 DIAGNOSIS — M19071 Primary osteoarthritis, right ankle and foot: Secondary | ICD-10-CM | POA: Diagnosis not present

## 2022-05-18 DIAGNOSIS — Z751 Person awaiting admission to adequate facility elsewhere: Secondary | ICD-10-CM

## 2022-05-18 DIAGNOSIS — J4489 Other specified chronic obstructive pulmonary disease: Secondary | ICD-10-CM | POA: Diagnosis present

## 2022-05-18 DIAGNOSIS — I2510 Atherosclerotic heart disease of native coronary artery without angina pectoris: Secondary | ICD-10-CM | POA: Diagnosis present

## 2022-05-18 DIAGNOSIS — I959 Hypotension, unspecified: Secondary | ICD-10-CM | POA: Diagnosis not present

## 2022-05-18 DIAGNOSIS — Z6836 Body mass index (BMI) 36.0-36.9, adult: Secondary | ICD-10-CM

## 2022-05-18 DIAGNOSIS — F32A Depression, unspecified: Secondary | ICD-10-CM | POA: Diagnosis present

## 2022-05-18 DIAGNOSIS — I739 Peripheral vascular disease, unspecified: Secondary | ICD-10-CM | POA: Diagnosis not present

## 2022-05-18 DIAGNOSIS — I70235 Atherosclerosis of native arteries of right leg with ulceration of other part of foot: Secondary | ICD-10-CM | POA: Diagnosis not present

## 2022-05-18 DIAGNOSIS — E11621 Type 2 diabetes mellitus with foot ulcer: Secondary | ICD-10-CM | POA: Diagnosis present

## 2022-05-18 DIAGNOSIS — E1142 Type 2 diabetes mellitus with diabetic polyneuropathy: Secondary | ICD-10-CM | POA: Diagnosis present

## 2022-05-18 DIAGNOSIS — I10 Essential (primary) hypertension: Secondary | ICD-10-CM | POA: Diagnosis not present

## 2022-05-18 DIAGNOSIS — S81809A Unspecified open wound, unspecified lower leg, initial encounter: Secondary | ICD-10-CM | POA: Diagnosis not present

## 2022-05-18 DIAGNOSIS — L089 Local infection of the skin and subcutaneous tissue, unspecified: Secondary | ICD-10-CM | POA: Insufficient documentation

## 2022-05-18 DIAGNOSIS — J449 Chronic obstructive pulmonary disease, unspecified: Secondary | ICD-10-CM

## 2022-05-18 DIAGNOSIS — Z888 Allergy status to other drugs, medicaments and biological substances status: Secondary | ICD-10-CM

## 2022-05-18 DIAGNOSIS — Z811 Family history of alcohol abuse and dependence: Secondary | ICD-10-CM

## 2022-05-18 DIAGNOSIS — M869 Osteomyelitis, unspecified: Secondary | ICD-10-CM

## 2022-05-18 DIAGNOSIS — M65871 Other synovitis and tenosynovitis, right ankle and foot: Secondary | ICD-10-CM | POA: Diagnosis not present

## 2022-05-18 DIAGNOSIS — Z9981 Dependence on supplemental oxygen: Secondary | ICD-10-CM

## 2022-05-18 DIAGNOSIS — I70261 Atherosclerosis of native arteries of extremities with gangrene, right leg: Secondary | ICD-10-CM | POA: Diagnosis not present

## 2022-05-18 DIAGNOSIS — I700 Atherosclerosis of aorta: Secondary | ICD-10-CM | POA: Diagnosis not present

## 2022-05-18 DIAGNOSIS — Z89421 Acquired absence of other right toe(s): Secondary | ICD-10-CM | POA: Diagnosis not present

## 2022-05-18 DIAGNOSIS — R601 Generalized edema: Secondary | ICD-10-CM | POA: Diagnosis not present

## 2022-05-18 DIAGNOSIS — Z7901 Long term (current) use of anticoagulants: Secondary | ICD-10-CM

## 2022-05-18 DIAGNOSIS — L97314 Non-pressure chronic ulcer of right ankle with necrosis of bone: Secondary | ICD-10-CM | POA: Diagnosis not present

## 2022-05-18 LAB — COMPREHENSIVE METABOLIC PANEL
ALT: 25 U/L (ref 0–44)
AST: 18 U/L (ref 15–41)
Albumin: 2.6 g/dL — ABNORMAL LOW (ref 3.5–5.0)
Alkaline Phosphatase: 170 U/L — ABNORMAL HIGH (ref 38–126)
Anion gap: 8 (ref 5–15)
BUN: 31 mg/dL — ABNORMAL HIGH (ref 8–23)
CO2: 28 mmol/L (ref 22–32)
Calcium: 9 mg/dL (ref 8.9–10.3)
Chloride: 98 mmol/L (ref 98–111)
Creatinine, Ser: 1.3 mg/dL — ABNORMAL HIGH (ref 0.61–1.24)
GFR, Estimated: >60 mL/min (ref >60)
Glucose, Bld: 336 mg/dL — ABNORMAL HIGH (ref 70–99)
Potassium: 5.6 mmol/L — ABNORMAL HIGH (ref 3.5–5.1)
Sodium: 134 mmol/L — ABNORMAL LOW (ref 135–145)
Total Bilirubin: 0.3 mg/dL (ref 0.3–1.2)
Total Protein: 7.4 g/dL (ref 6.5–8.1)

## 2022-05-18 LAB — CBC WITH DIFFERENTIAL/PLATELET
Abs Immature Granulocytes: 0.06 10*3/uL (ref 0.00–0.07)
Basophils Absolute: 0.1 10*3/uL (ref 0.0–0.1)
Basophils Relative: 1 %
Eosinophils Absolute: 0.2 10*3/uL (ref 0.0–0.5)
Eosinophils Relative: 2 %
HCT: 35.3 % — ABNORMAL LOW (ref 39.0–52.0)
Hemoglobin: 10.8 g/dL — ABNORMAL LOW (ref 13.0–17.0)
Immature Granulocytes: 1 %
Lymphocytes Relative: 8 %
Lymphs Abs: 1 10*3/uL (ref 0.7–4.0)
MCH: 27.8 pg (ref 26.0–34.0)
MCHC: 30.6 g/dL (ref 30.0–36.0)
MCV: 91 fL (ref 80.0–100.0)
Monocytes Absolute: 0.6 10*3/uL (ref 0.1–1.0)
Monocytes Relative: 5 %
Neutro Abs: 9.9 10*3/uL — ABNORMAL HIGH (ref 1.7–7.7)
Neutrophils Relative %: 83 %
Platelets: 345 10*3/uL (ref 150–400)
RBC: 3.88 MIL/uL — ABNORMAL LOW (ref 4.22–5.81)
RDW: 13.7 % (ref 11.5–15.5)
WBC: 11.8 10*3/uL — ABNORMAL HIGH (ref 4.0–10.5)
nRBC: 0 % (ref 0.0–0.2)

## 2022-05-18 LAB — GLUCOSE, CAPILLARY: Glucose-Capillary: 427 mg/dL — ABNORMAL HIGH (ref 70–99)

## 2022-05-18 LAB — PROTIME-INR
INR: 1.3 — ABNORMAL HIGH (ref 0.8–1.2)
Prothrombin Time: 16.1 seconds — ABNORMAL HIGH (ref 11.4–15.2)

## 2022-05-18 LAB — LACTIC ACID, PLASMA
Lactic Acid, Venous: 1.5 mmol/L (ref 0.5–1.9)
Lactic Acid, Venous: 1.3 mmol/L (ref 0.5–1.9)

## 2022-05-18 LAB — SEDIMENTATION RATE: Sed Rate: 92 mm/hr — ABNORMAL HIGH (ref 0–20)

## 2022-05-18 MED ORDER — VANCOMYCIN HCL 500 MG/100ML IV SOLN
500.0000 mg | Freq: Once | INTRAVENOUS | Status: AC
  Administered 2022-05-19: 500 mg via INTRAVENOUS
  Filled 2022-05-18: qty 100

## 2022-05-18 MED ORDER — ONDANSETRON HCL 4 MG PO TABS: 4.0000 mg | ORAL_TABLET | Freq: Four times a day (QID) | ORAL | Status: DC | PRN

## 2022-05-18 MED ORDER — VANCOMYCIN HCL IN DEXTROSE 1-5 GM/200ML-% IV SOLN: 1000.0000 mg | Freq: Once | INTRAVENOUS | Status: DC

## 2022-05-18 MED ORDER — FERROUS SULFATE 325 (65 FE) MG PO TABS
325.0000 mg | ORAL_TABLET | Freq: Every day | ORAL | Status: DC
  Administered 2022-05-19 – 2022-06-06 (×17): 325 mg via ORAL
  Filled 2022-05-18 (×17): qty 1

## 2022-05-18 MED ORDER — INSULIN ASPART 100 UNIT/ML IJ SOLN
0.0000 [IU] | INTRAMUSCULAR | Status: DC
  Administered 2022-05-18: 20 [IU] via SUBCUTANEOUS
  Administered 2022-05-19 (×2): 11 [IU] via SUBCUTANEOUS
  Administered 2022-05-19: 7 [IU] via SUBCUTANEOUS
  Administered 2022-05-19: 15 [IU] via SUBCUTANEOUS
  Administered 2022-05-20 (×2): 4 [IU] via SUBCUTANEOUS
  Administered 2022-05-20: 7 [IU] via SUBCUTANEOUS
  Administered 2022-05-20: 4 [IU] via SUBCUTANEOUS
  Administered 2022-05-21: 7 [IU] via SUBCUTANEOUS
  Administered 2022-05-21: 4 [IU] via SUBCUTANEOUS
  Administered 2022-05-21: 11 [IU] via SUBCUTANEOUS
  Administered 2022-05-21: 3 [IU] via SUBCUTANEOUS
  Administered 2022-05-22: 7 [IU] via SUBCUTANEOUS
  Administered 2022-05-22: 4 [IU] via SUBCUTANEOUS
  Administered 2022-05-22: 11 [IU] via SUBCUTANEOUS
  Administered 2022-05-22: 3 [IU] via SUBCUTANEOUS
  Filled 2022-05-18 (×17): qty 1

## 2022-05-18 MED ORDER — VITAMIN B-12 1000 MCG PO TABS
5000.0000 ug | ORAL_TABLET | Freq: Every day | ORAL | Status: DC
  Administered 2022-05-19 – 2022-06-06 (×18): 5000 ug via ORAL
  Filled 2022-05-18 (×18): qty 5

## 2022-05-18 MED ORDER — AMIODARONE HCL 200 MG PO TABS
200.0000 mg | ORAL_TABLET | Freq: Every day | ORAL | Status: DC
  Administered 2022-05-19 – 2022-06-06 (×18): 200 mg via ORAL
  Filled 2022-05-18 (×18): qty 1

## 2022-05-18 MED ORDER — RISPERIDONE 0.5 MG PO TABS
0.5000 mg | ORAL_TABLET | Freq: Two times a day (BID) | ORAL | Status: DC
  Administered 2022-05-18 – 2022-06-06 (×37): 0.5 mg via ORAL
  Filled 2022-05-18 (×4): qty 1
  Filled 2022-05-18: qty 0.5
  Filled 2022-05-18 (×3): qty 1
  Filled 2022-05-18: qty 0.5
  Filled 2022-05-18 (×6): qty 1
  Filled 2022-05-18: qty 0.5
  Filled 2022-05-18 (×12): qty 1
  Filled 2022-05-18 (×3): qty 0.5
  Filled 2022-05-18 (×9): qty 1

## 2022-05-18 MED ORDER — INSULIN GLARGINE-YFGN 100 UNIT/ML ~~LOC~~ SOLN
25.0000 [IU] | Freq: Every day | SUBCUTANEOUS | Status: DC
  Administered 2022-05-18 – 2022-05-29 (×12): 25 [IU] via SUBCUTANEOUS
  Filled 2022-05-18 (×12): qty 0.25

## 2022-05-18 MED ORDER — ENOXAPARIN SODIUM 80 MG/0.8ML IJ SOSY
0.5000 mg/kg | PREFILLED_SYRINGE | INTRAMUSCULAR | Status: DC
  Administered 2022-05-18 – 2022-05-28 (×11): 67.5 mg via SUBCUTANEOUS
  Filled 2022-05-18 (×12): qty 0.68

## 2022-05-18 MED ORDER — ACETAMINOPHEN 650 MG RE SUPP: 650.0000 mg | Freq: Four times a day (QID) | RECTAL | Status: DC | PRN

## 2022-05-18 MED ORDER — ACETAMINOPHEN 325 MG PO TABS: 650.0000 mg | ORAL_TABLET | Freq: Four times a day (QID) | ORAL | Status: DC | PRN

## 2022-05-18 MED ORDER — BUDESONIDE 0.5 MG/2ML IN SUSP
0.5000 mg | Freq: Two times a day (BID) | RESPIRATORY_TRACT | Status: DC
  Administered 2022-05-19 – 2022-06-06 (×35): 0.5 mg via RESPIRATORY_TRACT
  Filled 2022-05-18 (×37): qty 2

## 2022-05-18 MED ORDER — SODIUM ZIRCONIUM CYCLOSILICATE 10 G PO PACK
10.0000 g | PACK | Freq: Once | ORAL | Status: DC
  Filled 2022-05-18 (×2): qty 1

## 2022-05-18 MED ORDER — MORPHINE SULFATE (PF) 2 MG/ML IV SOLN
2.0000 mg | INTRAVENOUS | Status: DC | PRN
  Administered 2022-05-26: 2 mg via INTRAVENOUS
  Filled 2022-05-18: qty 1

## 2022-05-18 MED ORDER — FUROSEMIDE 40 MG PO TABS
40.0000 mg | ORAL_TABLET | Freq: Every day | ORAL | Status: DC
  Administered 2022-05-19 – 2022-05-23 (×5): 40 mg via ORAL
  Filled 2022-05-18 (×5): qty 1

## 2022-05-18 MED ORDER — SODIUM CHLORIDE 0.9 % IV SOLN
2.0000 g | Freq: Three times a day (TID) | INTRAVENOUS | Status: DC
  Administered 2022-05-18 – 2022-05-25 (×19): 2 g via INTRAVENOUS
  Filled 2022-05-18: qty 2
  Filled 2022-05-18 (×3): qty 12.5
  Filled 2022-05-18: qty 2
  Filled 2022-05-18 (×4): qty 12.5
  Filled 2022-05-18: qty 2
  Filled 2022-05-18 (×7): qty 12.5
  Filled 2022-05-18 (×3): qty 2
  Filled 2022-05-18: qty 12.5

## 2022-05-18 MED ORDER — TAMSULOSIN HCL 0.4 MG PO CAPS
0.4000 mg | ORAL_CAPSULE | Freq: Every day | ORAL | Status: DC
  Administered 2022-05-19 – 2022-06-06 (×18): 0.4 mg via ORAL
  Filled 2022-05-18 (×18): qty 1

## 2022-05-18 MED ORDER — ISOSORBIDE MONONITRATE ER 30 MG PO TB24
60.0000 mg | ORAL_TABLET | Freq: Every day | ORAL | Status: DC
  Administered 2022-05-19 – 2022-06-06 (×18): 60 mg via ORAL
  Filled 2022-05-18 (×12): qty 2
  Filled 2022-05-18 (×2): qty 1
  Filled 2022-05-18: qty 2
  Filled 2022-05-18: qty 1
  Filled 2022-05-18 (×2): qty 2

## 2022-05-18 MED ORDER — SODIUM CHLORIDE 0.9 % IV SOLN: 2.0000 g | Freq: Once | INTRAVENOUS | Status: DC

## 2022-05-18 MED ORDER — FINASTERIDE 5 MG PO TABS
5.0000 mg | ORAL_TABLET | Freq: Every day | ORAL | Status: DC
  Administered 2022-05-19 – 2022-06-06 (×18): 5 mg via ORAL
  Filled 2022-05-18 (×18): qty 1

## 2022-05-18 MED ORDER — METRONIDAZOLE 500 MG PO TABS
500.0000 mg | ORAL_TABLET | Freq: Two times a day (BID) | ORAL | Status: AC
  Administered 2022-05-18 – 2022-05-25 (×13): 500 mg via ORAL
  Filled 2022-05-18 (×14): qty 1

## 2022-05-18 MED ORDER — SODIUM CHLORIDE 0.9 % IV SOLN: INTRAVENOUS | Status: DC

## 2022-05-18 MED ORDER — ONDANSETRON HCL 4 MG/2ML IJ SOLN: 4.0000 mg | Freq: Four times a day (QID) | INTRAMUSCULAR | Status: DC | PRN

## 2022-05-18 MED ORDER — METRONIDAZOLE 500 MG/100ML IV SOLN: 500.0000 mg | Freq: Once | INTRAVENOUS | Status: DC

## 2022-05-18 MED ORDER — VANCOMYCIN HCL 2000 MG/400ML IV SOLN
2000.0000 mg | Freq: Once | INTRAVENOUS | Status: AC
  Administered 2022-05-18: 2000 mg via INTRAVENOUS
  Filled 2022-05-18: qty 400

## 2022-05-18 MED ORDER — ASPIRIN 81 MG PO TBEC
81.0000 mg | DELAYED_RELEASE_TABLET | Freq: Every day | ORAL | Status: DC
  Administered 2022-05-19 – 2022-06-06 (×17): 81 mg via ORAL
  Filled 2022-05-18 (×17): qty 1

## 2022-05-18 MED ORDER — ALBUTEROL SULFATE (2.5 MG/3ML) 0.083% IN NEBU
2.5000 mg | INHALATION_SOLUTION | RESPIRATORY_TRACT | Status: DC | PRN
  Administered 2022-05-21: 2.5 mg via RESPIRATORY_TRACT
  Filled 2022-05-18 (×2): qty 3

## 2022-05-18 MED ORDER — VANCOMYCIN VARIABLE DOSE PER UNSTABLE RENAL FUNCTION (PHARMACIST DOSING): Status: DC

## 2022-05-18 MED ORDER — SODIUM CHLORIDE 0.9 % IV BOLUS
500.0000 mL | Freq: Once | INTRAVENOUS | Status: AC
  Administered 2022-05-18: 500 mL via INTRAVENOUS

## 2022-05-18 MED ORDER — LOSARTAN POTASSIUM 50 MG PO TABS
50.0000 mg | ORAL_TABLET | Freq: Every day | ORAL | Status: DC
  Administered 2022-05-19 – 2022-05-23 (×5): 50 mg via ORAL
  Filled 2022-05-18 (×5): qty 1

## 2022-05-18 MED ORDER — HYDROCODONE-ACETAMINOPHEN 5-325 MG PO TABS
1.0000 | ORAL_TABLET | ORAL | Status: DC | PRN
  Administered 2022-05-24 – 2022-05-28 (×7): 2 via ORAL
  Administered 2022-05-28 – 2022-05-30 (×4): 1 via ORAL
  Filled 2022-05-18 (×2): qty 1
  Filled 2022-05-18 (×6): qty 2
  Filled 2022-05-18: qty 1
  Filled 2022-05-18 (×2): qty 2

## 2022-05-18 NOTE — Consult Note (Signed)
PHARMACY -  BRIEF ANTIBIOTIC NOTE   Pharmacy has received consult(s) for osteo from an ED provider.  The patient's profile has been reviewed for ht/wt/allergies/indication/available labs.    One time order(s) placed for cefepime and vancomycin  Further antibiotics/pharmacy consults should be ordered by admitting physician if indicated.                       Thank you, Oswald Hillock 05/18/2022  7:59 PM

## 2022-05-18 NOTE — Assessment & Plan Note (Signed)
No complaints of chest pain and EKG nonacute Continue aspirin, ezetimibe, Imdur, losartan

## 2022-05-18 NOTE — ED Triage Notes (Addendum)
First nurse note: Pt to ED via [facility]. Pt sent due to bone infection in right his fibula. Pt not currently on antibiotics. Pt had xray done at [facility].

## 2022-05-18 NOTE — Progress Notes (Signed)
Anticoagulation monitoring(Lovenox):  67 yo male ordered Lovenox 40 mg Q24h    Filed Weights   05/18/22 2150  Weight: 135.5 kg (298 lb 12.8 oz)   BMI 35.9    Lab Results  Component Value Date   CREATININE 1.30 (H) 05/18/2022   CREATININE 0.99 03/26/2022   CREATININE 1.24 03/21/2022   Estimated Creatinine Clearance: 83.5 mL/min (A) (by C-G formula based on SCr of 1.3 mg/dL (H)). Hemoglobin & Hematocrit     Component Value Date/Time   HGB 10.8 (L) 05/18/2022 1527   HGB 10.7 (L) 05/15/2022 1440   HCT 35.3 (L) 05/18/2022 1527   HCT 33.4 (L) 05/15/2022 1440     Per Protocol for Patient with estCrcl > 30 ml/min and BMI > 30, will transition to Lovenox 67.5 mg Q24h.

## 2022-05-18 NOTE — ED Notes (Signed)
IV team at bedside 

## 2022-05-18 NOTE — H&P (Signed)
History and Physical    Patient: [name]. OVZ:[id number] DOB: 1956-04-23 DOA: 05/18/2022 DOS: the patient was seen and examined on 05/18/2022 PCP: [name], MD  Patient coming from: Home  Chief Complaint:  Chief Complaint  Patient presents with   Wound Infection    HPI: Silvestre Mines. is a 67 y.o. male with medical history significant for COPD, chronic respiratory failure on home O2,, CAD s/p CABG dCHF, diabetes, OSA on BiPAP ,BPH, hypertension,  hypothyroidism, paroxysmal atrial fibrillation on Eliquis and amiodarone, morbid obesity, PAD s/p left BKA, with chronic nonhealing ulcer of the right ankle following an injury in December 2023 when he hit his ankle against the wheel of his wheelchair, who was sent in by podiatry due to concerns for developing osteomyelitis at the area of the ulcer.  Patient went in to see the podiatrist as the area was turning black where upon x-ray revealed bony erosion.  He was sent in for MRI, IV antibiotics and a vascular assessment.  He had a previously scheduled vascular appointment but this was not until the end of the month.  Patient denies fevers or chills. ED course and data review: Vitals within normal limits.  Labs with mild leukocytosis of 11,800 and normal lactic acid of 1.5.  Hemoglobin at baseline of 10.8.  Creatinine 1.3, above baseline of 0.99, with potassium of 5.6.  Blood glucose 336.  EKG, independently viewed and interpreted shows NSR at 85 with no acute ST-T wave changes.Chest x-ray nonacute. Patient was given Lokelma to treat his hyperkalemia and given an IV fluid bolus.  He was started on cefepime, vancomycin and Flagyl and hospitalist consulted for admission.   Review of Systems: As mentioned in the history of present illness. All other systems reviewed and are negative.  Past Medical History:  Diagnosis Date   Arrhythmia    atrial fibrillation   Asthma    CHF (congestive heart failure) (HCC)    COPD (chronic  obstructive pulmonary disease) (HCC)    Coronary artery disease    Depression    Diabetes mellitus without complication (HCC)    Gout    History anabolic steroid use    Hyperlipidemia    Hypertension    Hypogonadism in male    MI (myocardial infarction) ([place])    Morbid obesity ([place])    Myocardial infarction ([name])    Peripheral vascular disease ([place])    Perirectal abscess    Pleurisy    Sleep apnea    CPAP at night, no oxygen   Varicella    Past Surgical History:  Procedure Laterality Date   ABDOMINAL AORTIC ANEURYSM REPAIR     ACHILLES TENDON SURGERY Left 01/10/2021   Procedure: ACHILLES LENGTHENING/KIDNER;  Surgeon: Caroline More, DPM;  Location: [facility];  Service: Podiatry;  Laterality: Left;   AMPUTATION Left 10/03/2021   Procedure: AMPUTATION BELOW KNEE;  Surgeon: Katha Cabal, MD;  Location: [facility];  Service: Vascular;  Laterality: Left;   AMPUTATION TOE Right 02/10/2016   Procedure: AMPUTATION TOE 3RD TOE;  Surgeon: Samara Deist, DPM;  Location: [facility];  Service: Podiatry;  Laterality: Right;   AMPUTATION TOE Left 02/24/2020   Procedure: AMPUTATION TOE;  Surgeon: Caroline More, DPM;  Location: [facility];  Service: Podiatry;  Laterality: Left;   APPLICATION OF WOUND VAC Left 02/29/2020   Procedure: APPLICATION OF WOUND VAC;  Surgeon: Caroline More, DPM;  Location: [facility];  Service: Podiatry;  Laterality: Left;   CARDIAC CATHETERIZATION  CARDIOVERSION [name] 02/13/2022   Procedure: CARDIOVERSION;  Surgeon: Corey Skains, MD;  Location: [facility];  Service: Cardiovascular;  Laterality: [name];   COLONOSCOPY WITH PROPOFOL [name] 11/18/2015   Procedure: COLONOSCOPY WITH PROPOFOL;  Surgeon: Manya Silvas, MD;  Location: [facility];  Service: Endoscopy;  Laterality: [name];   CORONARY ARTERY BYPASS GRAFT     CORONARY STENT INTERVENTION [name] 02/02/2020   Procedure: CORONARY STENT INTERVENTION;  Surgeon: Isaias Cowman, MD;  Location: [place] [facility];   Service: Cardiovascular;  Laterality: [name];   INCISION AND DRAINAGE Left 08/07/2021   Procedure: INCISION AND DRAINAGE-Partial Calcanectomy;  Surgeon: Caroline More, DPM;  Location: [facility];  Service: Podiatry;  Laterality: Left;   IRRIGATION AND DEBRIDEMENT FOOT Left 02/29/2020   Procedure: IRRIGATION AND DEBRIDEMENT FOOT;  Surgeon: Caroline More, DPM;  Location: [facility];  Service: Podiatry;  Laterality: Left;   IRRIGATION AND DEBRIDEMENT FOOT Left 02/24/2020   Procedure: IRRIGATION AND DEBRIDEMENT FOOT;  Surgeon: Caroline More, DPM;  Location: [facility];  Service: Podiatry;  Laterality: Left;   KNEE ARTHROSCOPY     LEFT HEART CATH AND CORS/GRAFTS ANGIOGRAPHY [name] 02/02/2020   Procedure: LEFT HEART CATH AND CORS/GRAFTS ANGIOGRAPHY;  Surgeon: Teodoro Spray, MD;  Location: [place] [facility];  Service: Cardiovascular;  Laterality: [name];   LOWER EXTREMITY ANGIOGRAPHY Left 02/25/2020   Procedure: Lower Extremity Angiography;  Surgeon: Algernon Huxley, MD;  Location: [place] [facility];  Service: Cardiovascular;  Laterality: Left;   LOWER EXTREMITY ANGIOGRAPHY Left 01/04/2021   Procedure: LOWER EXTREMITY ANGIOGRAPHY;  Surgeon: Algernon Huxley, MD;  Location: [place] [facility];  Service: Cardiovascular;  Laterality: Left;   METATARSAL HEAD EXCISION Left 01/10/2021   Procedure: METATARSAL HEAD EXCISION - LEFT 5th;  Surgeon: Caroline More, DPM;  Location: [facility];  Service: Podiatry;  Laterality: Left;   PERIPHERAL VASCULAR CATHETERIZATION Right 01/24/2016   Procedure: Lower Extremity Angiography;  Surgeon: Katha Cabal, MD;  Location: [place] [facility];  Service: Cardiovascular;  Laterality: Right;   PERIPHERAL VASCULAR CATHETERIZATION Right 01/25/2016   Procedure: Lower Extremity Angiography;  Surgeon: Katha Cabal, MD;  Location: [place] [facility];  Service: Cardiovascular;  Laterality: Right;   TOE AMPUTATION     TONSILLECTOMY     Social History:  reports that he quit smoking  about 7 years ago. His smoking use included cigarettes. He has a 22.50 pack-year smoking history. He has never used smokeless tobacco. He reports that he does not currently use alcohol after a past usage of about 3.0 standard drinks of alcohol per week. He reports that he does not use drugs.  Allergies  Allergen Reactions   Penicillins Other (See Comments)    Happened at 66 years old and pt. stated he passed out  He has tolerated amoxicillin/clavulanate and ampicillin/sulbactam   Statins     Other reaction(s): Muscle Pain Causes legs to ache per pt   Metformin And Related Diarrhea    Family History  Problem Relation Age of Onset   Hypertension Father    Coronary artery disease Father    Alcohol abuse Father    Heart failure Brother     Prior to Admission medications   Medication Sig Start Date End Date Taking? Authorizing Provider  acetaminophen (TYLENOL) 325 MG tablet Take 2 tablets (650 mg total) by mouth every 6 (six) hours as needed for mild pain (or Fever >/= 101). Patient taking differently: Take 650 mg by mouth every 4 (four) hours as  needed for mild pain (or Fever >/= 101). 11/10/21   Emeterio Reeve, DO  amiodarone (PACERONE) 200 MG tablet Take 200 mg by mouth daily.    [provider]  apixaban (ELIQUIS) 5 MG TABS tablet Take 5 mg by mouth 2 (two) times daily.    [provider]  aspirin EC 81 MG tablet Take 81 mg by mouth daily. Swallow whole.    [provider]  budesonide (PULMICORT) 0.5 MG/2ML nebulizer solution Take 2 mLs (0.5 mg total) by nebulization 2 (two) times daily. 11/10/21   Emeterio Reeve, DO  Cholecalciferol 25 MCG (1000 UT) tablet Take 1,000 Units by mouth daily.    [provider]  Cyanocobalamin (VITAMIN B-12) 5000 MCG TBDP Take 10,000 mcg by mouth daily.    [provider]  diltiazem (TIAZAC) 300 MG 24 hr capsule Take 300 mg by mouth daily.    [provider]  ezetimibe (ZETIA) 10 MG tablet Take 10  mg by mouth at bedtime.    [provider]  ferrous sulfate 325 (65 FE) MG tablet Take 325 mg by mouth daily with breakfast.    [provider]  finasteride (PROSCAR) 5 MG tablet Take 1 tablet (5 mg total) by mouth daily. 11/11/21   Emeterio Reeve, DO  furosemide (LASIX) 20 MG tablet Take 2 tablets (40 mg total) by mouth daily. 02/15/22   Fritzi Mandes, MD  gabapentin (NEURONTIN) 300 MG capsule Take 300 mg by mouth at bedtime.    [provider]  HUMALOG 100 UNIT/ML injection Inject 15 Units into the skin 3 (three) times daily with meals. >200 02/05/22   [provider]  insulin glargine-yfgn (SEMGLEE) 100 UNIT/ML injection Inject 0.25 mLs (25 Units total) into the skin at bedtime. Patient taking differently: Inject 40 Units into the skin every morning. 11/10/21   Emeterio Reeve, DO  isosorbide mononitrate (IMDUR) 60 MG 24 hr tablet Take 60 mg by mouth in the morning and at bedtime.    [provider]  levothyroxine (SYNTHROID) 25 MCG tablet Take 1 tablet (25 mcg total) by mouth daily at 6 (six) AM. 11/11/21   Emeterio Reeve, DO  losartan (COZAAR) 50 MG tablet Take 50 mg by mouth daily.    [provider]  Multiple Vitamins-Minerals (MULTIVITAMIN WITH MINERALS) tablet Take 1 tablet by mouth daily.    [provider]  nitrofurantoin, macrocrystal-monohydrate, (MACROBID) 100 MG capsule Take 100 mg by mouth daily.    [provider]  pramipexole (MIRAPEX) 1 MG tablet Take 2 mg by mouth at bedtime.    [provider]  primidone (MYSOLINE) 250 MG tablet Take 250 mg by mouth 2 (two) times daily. 01/23/20   [provider]  risperiDONE (RISPERDAL) 0.5 MG tablet Take 1 tablet (0.5 mg total) by mouth 2 (two) times daily. 11/10/21   Emeterio Reeve, DO  tamsulosin (FLOMAX) 0.4 MG CAPS capsule Take 0.4 mg by mouth daily.    [provider]  traMADol (ULTRAM) 50 MG tablet Take 1 tablet (50 mg total) by  mouth daily as needed for moderate pain. 08/11/21   Fritzi Mandes, MD  vitamin C (ASCORBIC ACID) 500 MG tablet Take 500 mg by mouth daily.    [provider]    Physical Exam: Vitals:   05/18/22 1529 05/18/22 2000  BP: (!) 121/53 139/76  Pulse: 79 85  Resp: 20 18  Temp: 97.7 F (36.5 C) 98 F (36.7 C)  TempSrc: Oral Oral  SpO2: 97% 97%  Physical Exam Vitals and nursing note reviewed.  Constitutional:      General: He is not in acute distress. HENT:     Head: Normocephalic and atraumatic.  Cardiovascular:     Rate and Rhythm: Normal rate and regular rhythm.     Heart sounds: Normal heart sounds.  Pulmonary:     Effort: Pulmonary effort is normal.     Breath sounds: Normal breath sounds.  Abdominal:     Palpations: Abdomen is soft.     Tenderness: There is no abdominal tenderness.  Neurological:     Mental Status: Mental status is at baseline.     Labs on Admission: I have personally reviewed following labs and imaging studies  CBC: Recent Labs  Lab 05/15/22 1440 05/18/22 1527  WBC 12.2* 11.8*  NEUTROABS  --  9.9*  HGB 10.7* 10.8*  HCT 33.4* 35.3*  MCV 89 91.0  PLT 394 983   Basic Metabolic Panel: Recent Labs  Lab 05/18/22 1527  NA 134*  K 5.6*  CL 98  CO2 28  GLUCOSE 336*  BUN 31*  CREATININE 1.30*  CALCIUM 9.0   GFR: CrCl cannot be calculated (Unknown ideal weight.). Liver Function Tests: Recent Labs  Lab 05/18/22 1527  AST 18  ALT 25  ALKPHOS 170*  BILITOT 0.3  PROT 7.4  ALBUMIN 2.6*   No results for input(s): "LIPASE", "AMYLASE" in the last 168 hours. No results for input(s): "AMMONIA" in the last 168 hours. Coagulation Profile: Recent Labs  Lab 05/18/22 1527  INR 1.3*   Cardiac Enzymes: No results for input(s): "CKTOTAL", "CKMB", "CKMBINDEX", "TROPONINI" in the last 168 hours. BNP (last 3 results) No results for input(s): "PROBNP" in the last 8760 hours. HbA1C: No results for input(s): "HGBA1C" in the last 72  hours. CBG: No results for input(s): "GLUCAP" in the last 168 hours. Lipid Profile: No results for input(s): "CHOL", "HDL", "LDLCALC", "TRIG", "CHOLHDL", "LDLDIRECT" in the last 72 hours. Thyroid Function Tests: No results for input(s): "TSH", "T4TOTAL", "FREET4", "T3FREE", "THYROIDAB" in the last 72 hours. Anemia Panel: No results for input(s): "VITAMINB12", "FOLATE", "FERRITIN", "TIBC", "IRON", "RETICCTPCT" in the last 72 hours. Urine analysis:    Component Value Date/Time   COLORURINE YELLOW (A) 03/27/2022 0450   APPEARANCEUR CLEAR (A) 03/27/2022 0450   LABSPEC 1.014 03/27/2022 0450   PHURINE 6.0 03/27/2022 0450   GLUCOSEU 150 (A) 03/27/2022 0450   HGBUR NEGATIVE 03/27/2022 0450   BILIRUBINUR NEGATIVE 03/27/2022 0450   KETONESUR 20 (A) 03/27/2022 0450   PROTEINUR 100 (A) 03/27/2022 0450   NITRITE NEGATIVE 03/27/2022 0450   LEUKOCYTESUR TRACE (A) 03/27/2022 0450    Radiological Exams on Admission: MR ANKLE RIGHT WO CONTRAST  Result Date: 05/18/2022 CLINICAL DATA:  Ankle pain, chronic, inflammatory arthritis suspected. Patient sent due to bone infection in right lateral malleolus. EXAM: MRI OF THE RIGHT ANKLE WITHOUT CONTRAST TECHNIQUE: Multiplanar, multisequence MR imaging of the ankle was performed. No intravenous contrast was administered. COMPARISON:  Radiographs dated February 11, 2022 FINDINGS: TENDONS Peroneal: Peroneal longus tendon intact. Peroneal brevis intact. Edema along the peroneus brevis and peroneus longus tendons consistent with tenosynovitis. Posteromedial: Posterior tibial tendon intact. Flexor hallucis longus tendon intact. Flexor digitorum longus tendon intact. Anterior: Tibialis anterior tendon intact. Extensor hallucis longus tendon intact Extensor digitorum longus tendon intact. Achilles: Intermediate intrasubstance signal of the distal Achilles tendon suggesting tendinosis without tear. Plantar Fascia: Intact. LIGAMENTS Lateral: Anterior talofibular ligament  intact. Calcaneofibular ligament intact. Posterior talofibular ligament intact. Anterior and posterior tibiofibular  ligaments intact. Medial: Deltoid ligament intact. Spring ligament intact. CARTILAGE Ankle Joint: Small joint effusion. Normal ankle mortise. No chondral defect. Subtalar Joints/Sinus Tarsi: Degenerative changes with cartilage defect and subchondral cyst of the subtalar joint in the body of the talus. Normal sinus tarsi. Bones: Bone marrow edema of the lateral malleolus adjacent to the deep skin wound consistent with osteomyelitis. Soft Tissue: Generalized subcutaneous soft tissue edema about the medial and lateral aspect of the ankle/foot no drainable fluid collection or hematoma. Increased intra substance signal of the plantar muscles suggesting myopathy/myositis. Tarsal tunnel is normal. IMPRESSION: 1. Bone marrow edema of the lateral malleolus adjacent to the deep skin wound consistent with osteomyelitis. 2. Tenosynovitis of the peroneus brevis and peroneus longus tendons without evidence of tendon tear. 3. Generalized subcutaneous soft tissue edema about the medial and lateral aspect of the ankle/foot without drainable fluid collection or hematoma. 4. Increased intra substance signal of the plantar muscles suggesting myopathy/myositis. Electronically Signed   By: Keane Police D.O.   On: 05/18/2022 21:58   DG Chest 2 View  Result Date: 05/18/2022 CLINICAL DATA:  Suspected sepsis. EXAM: CHEST - 2 VIEW COMPARISON:  03/27/2022. FINDINGS: Unchanged fracture of the uppermost median sternotomy wire. Remaining wires appear intact. No focal airspace opacity. Stable postoperative changes of CABG. Normal heart size. Stable mediastinal contours with atherosclerotic calcifications and tortuosity of the aorta. No pleural effusion or pneumothorax. Visualized bones and upper abdomen are unremarkable. IMPRESSION: No acute cardiopulmonary disease. Electronically Signed   By: Emmit Alexanders M.D.   On:  05/18/2022 16:05     Data Reviewed: Relevant notes from primary care and specialist visits, past discharge summaries as available in EHR, including Care Everywhere. Prior diagnostic testing as pertinent to current admission diagnoses Updated medications and problem lists for reconciliation ED course, including vitals, labs, imaging, treatment and response to treatment Triage notes, nursing and pharmacy notes and ED provider's notes Notable results as noted in HPI   Assessment and Plan: Acute osteomyelitis of right foot ([place]) Nonhealing wound right ankle PAD s/p left BKA Sent in by podiatry with concern for osteomyelitis MRI right foot ---> osteomyelitis among other findings Cefepime and vancomycin pending osteomyelitis [facility] consult for angiogram Will keep n.p.o. after midnight for possible angiogram  Hyperkalemia Received a dose of Lokelma in the ED for potassium of 5.6 Continue to monitor  AKI (acute kidney injury) ([place]) Creatinine 1.3 above baseline of 0.99 IV hydration and monitor.  Avoid nephrotoxins and renally dose all meds  Uncontrolled type 2 diabetes mellitus with hypoglycemia, with long-term current use of insulin (HCC) Blood sugar in the 300s Continue home basal insulin Sliding scale coverage  AF (paroxysmal atrial fibrillation) (HCC) Chronic anticoagulation Continue amiodarone Will hold apixaban in anticipation of possible procedure  COPD (chronic obstructive pulmonary disease) (HCC) OSA on home BiPAP Chronic respiratory failure on home O2 Patient is at baseline Continue home Pulmicort and inhalers Continue nighttime BiPAP and O2  CAD with Hx of CABG No complaints of chest pain and EKG nonacute Continue aspirin, ezetimibe, Imdur, losartan  Chronic diastolic CHF (congestive heart failure) (HCC) Clinically euvolemic Continue Lasix, losartan  Essential hypertension BP controlled.  Continue home antihypertensives  BPH (benign prostatic  hyperplasia) Continue finasteride and tamsulosin    DVT prophylaxis: SCD  Consults: Vascular, Dr. Bridgett Larsson, podiatry  Advance Care Planning:   Code Status: Prior   Family Communication: none  Disposition Plan: Back to previous home environment  Severity of Illness: The appropriate patient status for this  patient is INPATIENT. Inpatient status is judged to be reasonable and necessary in order to provide the required intensity of service to ensure the patient's safety. The patient's presenting symptoms, physical exam findings, and initial radiographic and laboratory data in the context of their chronic comorbidities is felt to place them at high risk for further clinical deterioration. Furthermore, it is not anticipated that the patient will be medically stable for discharge from the hospital within 2 midnights of admission.   * I certify that at the point of admission it is my clinical judgment that the patient will require inpatient hospital care spanning beyond 2 midnights from the point of admission due to high intensity of service, high risk for further deterioration and high frequency of surveillance required.*  Author: Athena Masse, MD 05/18/2022 8:41 PM  For on call review [email or web address].

## 2022-05-18 NOTE — Assessment & Plan Note (Signed)
Creatinine 1.3 above baseline of 0.99 IV hydration and monitor.  Avoid nephrotoxins and renally dose all meds

## 2022-05-18 NOTE — Assessment & Plan Note (Signed)
BP controlled - Continue home antihypertensives 

## 2022-05-18 NOTE — Assessment & Plan Note (Deleted)
PAD s/p left BKA Sent in by podiatry with concern for osteomyelitis MRI Cefepime and vancomycin pending osteomyelitis [facility] consult for angiogram Will keep n.p.o. after midnight for possible angiogram

## 2022-05-18 NOTE — Assessment & Plan Note (Signed)
Blood sugar in the 300s Continue home basal insulin Sliding scale coverage

## 2022-05-18 NOTE — Assessment & Plan Note (Signed)
Continue finasteride and tamsulosin 

## 2022-05-18 NOTE — ED Provider Notes (Signed)
[facility] Provider Note    Event Date/Time   First MD Initiated Contact with Patient 05/18/22 1931     (approximate)   History   Wound Infection   HPI  [name]. is a 67 y.o. male who was sent from [facility] clinic due to concern for a bone infection in his right fibula not currently on antibiotics.  Patient has a history of CABG, A-fib, peripheral vascular disease, diabetes, hypertension, hyperlipidemia.  Patient's wife states that around Christmas time he hit the lateral side of his right ankle on a nail that was coming out of his wheelchair.  He scraped some of the skin off and has been having difficulties with healing.  He had an x-ray done with podiatry that overall looked reassuring about a week ago and he was seen by vascular surgery who is planning an angiography but it was not going to be scheduled until the 30th and the wife was very upset about this.  They then noticed that the wound started to continue not healing and turned black so followed back up with podiatry today where they did an x-ray concerning for osteo of the fibula therefore patient was sent in for MRI, IV antibiotics and vascular consultation   Physical Exam   Triage Vital Signs: ED Triage Vitals  Enc Vitals Group     BP 05/18/22 1529 (!) 121/53     Pulse Rate 05/18/22 1529 79     Resp 05/18/22 1529 20     Temp 05/18/22 1529 97.7 F (36.5 C)     Temp Source 05/18/22 1529 Oral     SpO2 05/18/22 1529 97 %     Weight --      Height --      Head Circumference --      Peak Flow --      Pain Score 05/18/22 1526 0     Pain Loc --      Pain Edu? --      Excl. in [place]? --     Most recent vital signs: Vitals:   05/18/22 1529  BP: (!) 121/53  Pulse: 79  Resp: 20  Temp: 97.7 F (36.5 C)  SpO2: 97%     General: Awake, no distress.  CV:  Good peripheral perfusion.  Resp:  Normal effort.  Abd:  No distention.  Other:  Patient has ulcer noted to the lateral side of the  right ankle with some eschar noted.  foot is warm and well-perfused.  Good distal pulse   ED Results / Procedures / Treatments   Labs (all labs ordered are listed, but only abnormal results are displayed) Labs Reviewed  COMPREHENSIVE METABOLIC PANEL - Abnormal; Notable for the following components:      Result Value   Sodium 134 (*)    Potassium 5.6 (*)    Glucose, Bld 336 (*)    BUN 31 (*)    Creatinine, Ser 1.30 (*)    Albumin 2.6 (*)    Alkaline Phosphatase 170 (*)    All other components within normal limits  CBC WITH DIFFERENTIAL/PLATELET - Abnormal; Notable for the following components:   WBC 11.8 (*)    RBC 3.88 (*)    Hemoglobin 10.8 (*)    HCT 35.3 (*)    Neutro Abs 9.9 (*)    All other components within normal limits  PROTIME-INR - Abnormal; Notable for the following components:   Prothrombin Time 16.1 (*)    INR  1.3 (*)    All other components within normal limits  CULTURE, BLOOD (ROUTINE X 2)  CULTURE, BLOOD (ROUTINE X 2)  LACTIC ACID, PLASMA  URINALYSIS, ROUTINE W REFLEX MICROSCOPIC  LACTIC ACID, PLASMA     EKG  My interpretation of EKG:  Normal sinus rate of 85 without any ST elevation or T wave inversions right bundle branch block and left anterior fascicular block.  RADIOLOGY I have reviewed the xray personally and interpreted and no evidence of pneumonia  PROCEDURES:  Critical Care performed: Yes, see critical care procedure note(s)  .1-3 Lead EKG Interpretation  Performed by: Vanessa Winifred, MD Authorized by: Vanessa Blaine, MD     Interpretation: normal     ECG rate:  70   ECG rate assessment: normal     Rhythm: sinus rhythm     Ectopy: none     Conduction: normal   .Critical Care  Performed by: Vanessa Harrison, MD Authorized by: Vanessa Pacheco, MD   Critical care provider statement:    Critical care time (minutes):  30   Critical care was necessary to treat or prevent imminent or life-threatening deterioration of the following  conditions:  Endocrine crisis   Critical care was time spent personally by me on the following activities:  Development of treatment plan with patient or surrogate, discussions with consultants, evaluation of patient's response to treatment, examination of patient, ordering and review of laboratory studies, ordering and review of radiographic studies, ordering and performing treatments and interventions, pulse oximetry, re-evaluation of patient's condition and review of old charts    MEDICATIONS ORDERED IN ED: Medications  metroNIDAZOLE (FLAGYL) IVPB 500 mg (has no administration in time range)  sodium zirconium cyclosilicate (LOKELMA) packet 10 g (has no administration in time range)  sodium chloride 0.9 % bolus 500 mL (has no administration in time range)     IMPRESSION / MDM / [place] / ED COURSE  I reviewed the triage vital signs and the nursing notes.   Patient's presentation is most consistent with acute presentation with potential threat to life or bodily function.   Patient comes in with concern for osteomyelitis.  Patient's foot is warm and well-perfused I do not see an acute vascular issue.  I did discuss the case with Dr. Cleda Mccreedy who recommended admission for MRI, IV antibiotics and vascular consultation.  Labs ordered to evaluate for sepsis, Electra abnormalities, AKI  CMP shows elevated creatinine with elevated potassium.  Glucose slightly elevated but anion gap normal.  Lactate was normal.  CBC shows elevated white count of 11.8.  Hemoglobin is stable.   Patient was given a little bit of fluids as well as some blood, to help with his AKI and hyperkalemia.  Patient is not septic we will hold off on further blood cultures, lactate.  Patient started on IV antibiotics and I will discuss with hospital team for admission.   The patient is on the cardiac monitor to evaluate for evidence of arrhythmia and/or significant heart rate changes.      FINAL CLINICAL  IMPRESSION(S) / ED DIAGNOSES   Final diagnoses:  Hyperkalemia  AKI (acute kidney injury) ([place])  Osteomyelitis of right fibula, unspecified type ([place])     Rx / DC Orders   ED Discharge Orders     None        Note:  This document was prepared using Dragon voice recognition software and may include unintentional dictation errors.   Vanessa , MD 05/18/22  2022  

## 2022-05-18 NOTE — Assessment & Plan Note (Signed)
OSA on home BiPAP Chronic respiratory failure on home O2 Patient is at baseline Continue home Pulmicort and inhalers Continue nighttime BiPAP and O2

## 2022-05-18 NOTE — Assessment & Plan Note (Signed)
Clinically euvolemic Continue Lasix, losartan

## 2022-05-18 NOTE — ED Notes (Signed)
Pt transferred to hospital bed from wheelchair. Pt is non-ambulatory, wheelchair bound.

## 2022-05-18 NOTE — ED Notes (Signed)
This RN attempted IV x 2 with no success. Pt states he is always an US guided IV. Will place IV team consult.

## 2022-05-18 NOTE — Assessment & Plan Note (Signed)
Received a dose of Lokelma in the ED for potassium of 5.6 Continue to monitor

## 2022-05-18 NOTE — Assessment & Plan Note (Addendum)
Chronic anticoagulation Continue amiodarone Will hold apixaban in anticipation of possible procedure

## 2022-05-18 NOTE — Progress Notes (Signed)
Pharmacy Antibiotic Note  [name]. is a 67 y.o. male admitted on 05/18/2022 with suspected diabetic foot infection. Pharmacy has been consulted for vancomycin and cefepime dosing.  Vancomycin and cefepime x 1 ordered- no doses given. Renal function elevated from baseline, though CrCl >60.  Plan: Vancomycin 2500 mg x 1 loading dose. Follow up creatinine in AM  Cefepime 2 grams every 8 hours    Weight: 135.5 kg (298 lb 12.8 oz)  Temp (24hrs), Avg:97.9 F (36.6 C), Min:97.7 F (36.5 C), Max:98.1 F (36.7 C)  Recent Labs  Lab 05/15/22 1440 05/18/22 1527  WBC 12.2* 11.8*  CREATININE  --  1.30*  LATICACIDVEN  --  1.5    Estimated Creatinine Clearance: 83.5 mL/min (A) (by C-G formula based on SCr of 1.3 mg/dL (H)).    Allergies  Allergen Reactions   Penicillins Other (See Comments)    Happened at 67 years old and pt. stated he passed out  He has tolerated amoxicillin/clavulanate and ampicillin/sulbactam   Statins     Other reaction(s): Muscle Pain Causes legs to ache per pt   Metformin And Related Diarrhea    Antimicrobials this admission: vancomycin 1/19 >>  cefepime 1/19 >>   Dose adjustments this admission: [name]  Microbiology results: 1/19 BCx: in process   Thank you for allowing pharmacy to be a part of this patient's care.  Glean Salvo, PharmD, BCPS Clinical Pharmacist  05/18/2022 10:07 PM

## 2022-05-19 DIAGNOSIS — M86171 Other acute osteomyelitis, right ankle and foot: Secondary | ICD-10-CM | POA: Insufficient documentation

## 2022-05-19 DIAGNOSIS — S81809A Unspecified open wound, unspecified lower leg, initial encounter: Secondary | ICD-10-CM | POA: Diagnosis not present

## 2022-05-19 LAB — BASIC METABOLIC PANEL
Anion gap: 6 (ref 5–15)
BUN: 28 mg/dL — ABNORMAL HIGH (ref 8–23)
CO2: 28 mmol/L (ref 22–32)
Calcium: 8.9 mg/dL (ref 8.9–10.3)
Chloride: 103 mmol/L (ref 98–111)
Creatinine, Ser: 1.07 mg/dL (ref 0.61–1.24)
GFR, Estimated: >60 mL/min (ref >60)
Glucose, Bld: 144 mg/dL — ABNORMAL HIGH (ref 70–99)
Potassium: 4.6 mmol/L (ref 3.5–5.1)
Sodium: 137 mmol/L (ref 135–145)

## 2022-05-19 LAB — CBC
HCT: 29.8 % — ABNORMAL LOW (ref 39.0–52.0)
Hemoglobin: 9.3 g/dL — ABNORMAL LOW (ref 13.0–17.0)
MCH: 28.1 pg (ref 26.0–34.0)
MCHC: 31.2 g/dL (ref 30.0–36.0)
MCV: 90 fL (ref 80.0–100.0)
Platelets: 339 10*3/uL (ref 150–400)
RBC: 3.31 MIL/uL — ABNORMAL LOW (ref 4.22–5.81)
RDW: 13.7 % (ref 11.5–15.5)
WBC: 12.2 10*3/uL — ABNORMAL HIGH (ref 4.0–10.5)
nRBC: 0 % (ref 0.0–0.2)

## 2022-05-19 LAB — GLUCOSE, CAPILLARY
Glucose-Capillary: 111 mg/dL — ABNORMAL HIGH (ref 70–99)
Glucose-Capillary: 207 mg/dL — ABNORMAL HIGH (ref 70–99)
Glucose-Capillary: 277 mg/dL — ABNORMAL HIGH (ref 70–99)
Glucose-Capillary: 278 mg/dL — ABNORMAL HIGH (ref 70–99)
Glucose-Capillary: 285 mg/dL — ABNORMAL HIGH (ref 70–99)
Glucose-Capillary: 303 mg/dL — ABNORMAL HIGH (ref 70–99)
Glucose-Capillary: 62 mg/dL — ABNORMAL LOW (ref 70–99)

## 2022-05-19 LAB — HIV ANTIBODY (ROUTINE TESTING W REFLEX): HIV Screen 4th Generation wRfx: NONREACTIVE

## 2022-05-19 LAB — C-REACTIVE PROTEIN: CRP: 16.6 mg/dL — ABNORMAL HIGH (ref <1.0)

## 2022-05-19 LAB — PREALBUMIN: Prealbumin: 9 mg/dL — ABNORMAL LOW (ref 18–38)

## 2022-05-19 MED ORDER — ZINC SULFATE 220 (50 ZN) MG PO CAPS: 220.0000 mg | ORAL_CAPSULE | Freq: Every day | ORAL | Status: DC

## 2022-05-19 MED ORDER — DEXTROSE 50 % IV SOLN
1.0000 | Freq: Once | INTRAVENOUS | Status: AC
  Administered 2022-05-19: 50 mL via INTRAVENOUS
  Filled 2022-05-19: qty 50

## 2022-05-19 MED ORDER — VANCOMYCIN HCL 1250 MG/250ML IV SOLN
1250.0000 mg | Freq: Two times a day (BID) | INTRAVENOUS | Status: DC
  Administered 2022-05-19 – 2022-05-25 (×12): 1250 mg via INTRAVENOUS
  Filled 2022-05-19 (×14): qty 250

## 2022-05-19 MED ORDER — ZINC SULFATE 220 (50 ZN) MG PO CAPS
220.0000 mg | ORAL_CAPSULE | Freq: Every day | ORAL | Status: DC
  Administered 2022-05-19 – 2022-05-30 (×11): 220 mg via ORAL
  Filled 2022-05-19 (×10): qty 1

## 2022-05-19 MED ORDER — VITAMIN C 500 MG PO TABS
250.0000 mg | ORAL_TABLET | Freq: Every day | ORAL | Status: DC
  Administered 2022-05-19 – 2022-05-21 (×3): 250 mg via ORAL
  Filled 2022-05-19 (×3): qty 1

## 2022-05-19 MED ORDER — ADULT MULTIVITAMIN W/MINERALS CH
1.0000 | ORAL_TABLET | Freq: Every day | ORAL | Status: DC
  Administered 2022-05-19 – 2022-06-06 (×18): 1 via ORAL
  Filled 2022-05-19 (×18): qty 1

## 2022-05-19 MED ORDER — VITAMIN C 500 MG PO TABS: 250.0000 mg | ORAL_TABLET | Freq: Every day | ORAL | Status: DC

## 2022-05-19 MED ORDER — ENSURE MAX PROTEIN PO LIQD
11.0000 [oz_av] | Freq: Two times a day (BID) | ORAL | Status: DC
  Administered 2022-05-19 – 2022-05-20 (×4): 11 [oz_av] via ORAL
  Filled 2022-05-19: qty 330

## 2022-05-19 MED ORDER — JUVEN PO PACK
1.0000 | PACK | Freq: Two times a day (BID) | ORAL | Status: DC
  Administered 2022-05-19 – 2022-06-06 (×27): 1 via ORAL

## 2022-05-19 NOTE — Progress Notes (Signed)
CBG 62, patient is NPO. Dr. Hollice Gong notified.

## 2022-05-19 NOTE — Assessment & Plan Note (Addendum)
Nonhealing wound right ankle PAD s/p left BKA Sent in by podiatry with concern for osteomyelitis MRI right foot ---> osteomyelitis among other findings Cefepime and vancomycin pending osteomyelitis [facility] consult for angiogram Will keep n.p.o. after midnight for possible angiogram

## 2022-05-19 NOTE — Consult Note (Signed)
Reason for Consult: Osteomyelitis right fibula. Referring Physician: Kellie Moor. is an 67 y.o. male.  HPI: This is a 67 year old male known to our clinic outpatient with a worsening ulceration over his right ankle.  Was seen in the office yesterday with the ulceration appearing more necrotic and probing down to the level of bone.  X-rays revealed early cortical erosion at the distal fibula consistent with osteomyelitis.  Decision was made for hospitalization for IV antibiotics and vascular evaluation.  Past Medical History:  Diagnosis Date   Arrhythmia    atrial fibrillation   Asthma    CHF (congestive heart failure) (HCC)    COPD (chronic obstructive pulmonary disease) (HCC)    Coronary artery disease    Depression    Diabetes mellitus without complication (HCC)    Gout    History anabolic steroid use    Hyperlipidemia    Hypertension    Hypogonadism in male    MI (myocardial infarction) ([place])    Morbid obesity ([place])    Myocardial infarction ([place])    Peripheral vascular disease ([place])    Perirectal abscess    Pleurisy    Sleep apnea    CPAP at night, no oxygen   Varicella     Past Surgical History:  Procedure Laterality Date   ABDOMINAL AORTIC ANEURYSM REPAIR     ACHILLES TENDON SURGERY Left 01/10/2021   Procedure: ACHILLES LENGTHENING/KIDNER;  Surgeon: Caroline More, DPM;  Location: [facility];  Service: Podiatry;  Laterality: Left;   AMPUTATION Left 10/03/2021   Procedure: AMPUTATION BELOW KNEE;  Surgeon: Katha Cabal, MD;  Location: [facility];  Service: Vascular;  Laterality: Left;   AMPUTATION TOE Right 02/10/2016   Procedure: AMPUTATION TOE 3RD TOE;  Surgeon: Samara Deist, DPM;  Location: [facility];  Service: Podiatry;  Laterality: Right;   AMPUTATION TOE Left 02/24/2020   Procedure: AMPUTATION TOE;  Surgeon: Caroline More, DPM;  Location: [facility];  Service: Podiatry;  Laterality: Left;   APPLICATION OF WOUND VAC Left 02/29/2020   Procedure:  APPLICATION OF WOUND VAC;  Surgeon: Caroline More, DPM;  Location: [facility];  Service: Podiatry;  Laterality: Left;   CARDIAC CATHETERIZATION     CARDIOVERSION [name] 02/13/2022   Procedure: CARDIOVERSION;  Surgeon: Corey Skains, MD;  Location: [facility];  Service: Cardiovascular;  Laterality: [name];   COLONOSCOPY WITH PROPOFOL [name] 11/18/2015   Procedure: COLONOSCOPY WITH PROPOFOL;  Surgeon: Manya Silvas, MD;  Location: [facility];  Service: Endoscopy;  Laterality: [name];   CORONARY ARTERY BYPASS GRAFT     CORONARY STENT INTERVENTION [name] 02/02/2020   Procedure: CORONARY STENT INTERVENTION;  Surgeon: Isaias Cowman, MD;  Location: [place] [facility];  Service: Cardiovascular;  Laterality: [name];   INCISION AND DRAINAGE Left 08/07/2021   Procedure: INCISION AND DRAINAGE-Partial Calcanectomy;  Surgeon: Caroline More, DPM;  Location: [facility];  Service: Podiatry;  Laterality: Left;   IRRIGATION AND DEBRIDEMENT FOOT Left 02/29/2020   Procedure: IRRIGATION AND DEBRIDEMENT FOOT;  Surgeon: Caroline More, DPM;  Location: [facility];  Service: Podiatry;  Laterality: Left;   IRRIGATION AND DEBRIDEMENT FOOT Left 02/24/2020   Procedure: IRRIGATION AND DEBRIDEMENT FOOT;  Surgeon: Caroline More, DPM;  Location: [facility];  Service: Podiatry;  Laterality: Left;   KNEE ARTHROSCOPY     LEFT HEART CATH AND CORS/GRAFTS ANGIOGRAPHY [name] 02/02/2020   Procedure: LEFT HEART CATH AND CORS/GRAFTS ANGIOGRAPHY;  Surgeon: Teodoro Spray, MD;  Location: [place] [facility];  Service: Cardiovascular;  Laterality: [name];   LOWER EXTREMITY ANGIOGRAPHY Left 02/25/2020   Procedure: Lower Extremity Angiography;  Surgeon: Algernon Huxley, MD;  Location: [place] [facility];  Service: Cardiovascular;  Laterality: Left;   LOWER EXTREMITY ANGIOGRAPHY Left 01/04/2021   Procedure: LOWER EXTREMITY ANGIOGRAPHY;  Surgeon: Algernon Huxley, MD;  Location: [place] [facility];  Service: Cardiovascular;  Laterality: Left;   METATARSAL HEAD  EXCISION Left 01/10/2021   Procedure: METATARSAL HEAD EXCISION - LEFT 5th;  Surgeon: Caroline More, DPM;  Location: [facility];  Service: Podiatry;  Laterality: Left;   PERIPHERAL VASCULAR CATHETERIZATION Right 01/24/2016   Procedure: Lower Extremity Angiography;  Surgeon: Katha Cabal, MD;  Location: [place] [facility];  Service: Cardiovascular;  Laterality: Right;   PERIPHERAL VASCULAR CATHETERIZATION Right 01/25/2016   Procedure: Lower Extremity Angiography;  Surgeon: Katha Cabal, MD;  Location: [place] [facility];  Service: Cardiovascular;  Laterality: Right;   TOE AMPUTATION     TONSILLECTOMY      Family History  Problem Relation Age of Onset   Hypertension Father    Coronary artery disease Father    Alcohol abuse Father    Heart failure Brother     Social History:  reports that he quit smoking about 7 years ago. His smoking use included cigarettes. He has a 22.50 pack-year smoking history. He has never used smokeless tobacco. He reports that he does not currently use alcohol after a past usage of about 3.0 standard drinks of alcohol per week. He reports that he does not use drugs.  Allergies:  Allergies  Allergen Reactions   Penicillins Other (See Comments)    Happened at 67 years old and pt. stated he passed out  He has tolerated amoxicillin/clavulanate and ampicillin/sulbactam   Statins     Other reaction(s): Muscle Pain Causes legs to ache per pt   Metformin And Related Diarrhea    Medications: Scheduled:  amiodarone  200 mg Oral Daily   aspirin EC  81 mg Oral Daily   budesonide  0.5 mg Nebulization BID   cyanocobalamin  5,000 mcg Oral Daily   enoxaparin (LOVENOX) injection  0.5 mg/kg Subcutaneous Q24H   ferrous sulfate  325 mg Oral Q breakfast   finasteride  5 mg Oral Daily   furosemide  40 mg Oral Daily   insulin aspart  0-20 Units Subcutaneous Q4H   insulin glargine-yfgn  25 Units Subcutaneous QHS   isosorbide mononitrate  60 mg Oral Daily    losartan  50 mg Oral Daily   metroNIDAZOLE  500 mg Oral Q12H   risperiDONE  0.5 mg Oral BID   sodium zirconium cyclosilicate  10 g Oral Once   tamsulosin  0.4 mg Oral Daily   vancomycin variable dose per unstable renal function (pharmacist dosing)   Does not apply See admin instructions    Results for orders placed or performed during the hospital encounter of 05/18/22 (from the past 48 hour(s))  Comprehensive metabolic panel     Status: Abnormal   Collection Time: 05/18/22  3:27 PM  Result Value Ref Range   Sodium 134 (L) 135 - 145 mmol/L   Potassium 5.6 (H) 3.5 - 5.1 mmol/L   Chloride 98 98 - 111 mmol/L   CO2 28 22 - 32 mmol/L   Glucose, Bld 336 (H) 70 - 99 mg/dL    Comment: Glucose reference range applies only to samples taken after fasting for at least 8 hours.   BUN 31 (H) 8 - 23  mg/dL   Creatinine, Ser 1.30 (H) 0.61 - 1.24 mg/dL   Calcium 9.0 8.9 - 10.3 mg/dL   Total Protein 7.4 6.5 - 8.1 g/dL   Albumin 2.6 (L) 3.5 - 5.0 g/dL   AST 18 15 - 41 U/L   ALT 25 0 - 44 U/L   Alkaline Phosphatase 170 (H) 38 - 126 U/L   Total Bilirubin 0.3 0.3 - 1.2 mg/dL   GFR, Estimated >60 >60 mL/min    Comment: (NOTE) Calculated using the CKD-EPI Creatinine Equation (2021)    Anion gap 8 5 - 15    Comment: Performed at [facility], [address] [place], [place] [address]  Lactic acid, plasma     Status: None   Collection Time: 05/18/22  3:27 PM  Result Value Ref Range   Lactic Acid, Venous 1.5 0.5 - 1.9 mmol/L    Comment: Performed at [facility], [address] [place], [place] [address]  CBC with Differential     Status: Abnormal   Collection Time: 05/18/22  3:27 PM  Result Value Ref Range   WBC 11.8 (H) 4.0 - 10.5 K/uL   RBC 3.88 (L) 4.22 - 5.81 MIL/uL   Hemoglobin 10.8 (L) 13.0 - 17.0 g/dL   HCT 35.3 (L) 39.0 - 52.0 %   MCV 91.0 80.0 - 100.0 fL   MCH 27.8 26.0 - 34.0 pg   MCHC 30.6 30.0 - 36.0 g/dL   RDW 13.7 11.5 - 15.5 %   Platelets 345 150 - 400 K/uL    nRBC 0.0 0.0 - 0.2 %   Neutrophils Relative % 83 %   Neutro Abs 9.9 (H) 1.7 - 7.7 K/uL   Lymphocytes Relative 8 %   Lymphs Abs 1.0 0.7 - 4.0 K/uL   Monocytes Relative 5 %   Monocytes Absolute 0.6 0.1 - 1.0 K/uL   Eosinophils Relative 2 %   Eosinophils Absolute 0.2 0.0 - 0.5 K/uL   Basophils Relative 1 %   Basophils Absolute 0.1 0.0 - 0.1 K/uL   Immature Granulocytes 1 %   Abs Immature Granulocytes 0.06 0.00 - 0.07 K/uL    Comment: Performed at [facility], [address] [place], [place] [address]  Protime-INR     Status: Abnormal   Collection Time: 05/18/22  3:27 PM  Result Value Ref Range   Prothrombin Time 16.1 (H) 11.4 - 15.2 seconds   INR 1.3 (H) 0.8 - 1.2    Comment: (NOTE) INR goal varies based on device and disease states. Performed at [facility], [address] [place], [place] [address]   Culture, blood (Routine x 2)     Status: None (Preliminary result)   Collection Time: 05/18/22  3:27 PM   Specimen: BLOOD  Result Value Ref Range   Specimen Description BLOOD RIGHT AC    Special Requests      BOTTLES DRAWN AEROBIC AND ANAEROBIC Blood Culture adequate volume   Culture      NO GROWTH < 24 HOURS Performed at [facility], [address] [place], [place] [address]    Report Status PENDING   Glucose, capillary     Status: Abnormal   Collection Time: 05/18/22  9:49 PM  Result Value Ref Range   Glucose-Capillary 427 (H) 70 - 99 mg/dL    Comment: Glucose reference range applies only to samples taken after fasting for at least 8 hours.   Comment 1 Notify RN   Lactic acid, plasma  Status: None   Collection Time: 05/18/22 10:10 PM  Result Value Ref Range   Lactic Acid, Venous 1.3 0.5 - 1.9 mmol/L    Comment: Performed at [facility], [address] [place], [place] [address]  Culture, blood (Routine X 2) w Reflex to ID Panel     Status: None (Preliminary result)   Collection Time: 05/18/22 10:10 PM   Specimen:  BLOOD  Result Value Ref Range   Specimen Description BLOOD BLOOD LEFT ARM    Special Requests      BOTTLES DRAWN AEROBIC AND ANAEROBIC Blood Culture adequate volume   Culture      NO GROWTH < 12 HOURS Performed at [facility], [address] [place], [place] [address]    Report Status PENDING   Sedimentation rate     Status: Abnormal   Collection Time: 05/18/22 10:10 PM  Result Value Ref Range   Sed Rate 92 (H) 0 - 20 mm/hr    Comment: Performed at [facility], [address] [place], [place] [address]  C-reactive protein     Status: Abnormal   Collection Time: 05/18/22 10:10 PM  Result Value Ref Range   CRP 16.6 (H) <1.0 mg/dL    Comment: Performed at [facility], [address] [place], [place] [address]  Prealbumin     Status: Abnormal   Collection Time: 05/18/22 10:10 PM  Result Value Ref Range   Prealbumin 9 (L) 18 - 38 mg/dL    Comment: Performed at [facility] [address], [place], [place] [address]  Glucose, capillary     Status: Abnormal   Collection Time: 05/19/22 12:58 AM  Result Value Ref Range   Glucose-Capillary 303 (H) 70 - 99 mg/dL    Comment: Glucose reference range applies only to samples taken after fasting for at least 8 hours.  Glucose, capillary     Status: Abnormal   Collection Time: 05/19/22  3:55 AM  Result Value Ref Range   Glucose-Capillary 207 (H) 70 - 99 mg/dL    Comment: Glucose reference range applies only to samples taken after fasting for at least 8 hours.  Basic metabolic panel     Status: Abnormal   Collection Time: 05/19/22  5:31 AM  Result Value Ref Range   Sodium 137 135 - 145 mmol/L   Potassium 4.6 3.5 - 5.1 mmol/L   Chloride 103 98 - 111 mmol/L   CO2 28 22 - 32 mmol/L   Glucose, Bld 144 (H) 70 - 99 mg/dL    Comment: Glucose reference range applies only to samples taken after fasting for at least 8 hours.   BUN 28 (H) 8 - 23 mg/dL   Creatinine, Ser 1.07 0.61 - 1.24 mg/dL   Calcium 8.9 8.9 -  10.3 mg/dL   GFR, Estimated >60 >60 mL/min    Comment: (NOTE) Calculated using the CKD-EPI Creatinine Equation (2021)    Anion gap 6 5 - 15    Comment: Performed at [facility], [address] [place], [place] [address]  CBC     Status: Abnormal   Collection Time: 05/19/22  5:31 AM  Result Value Ref Range   WBC 12.2 (H) 4.0 - 10.5 K/uL   RBC 3.31 (L) 4.22 - 5.81 MIL/uL   Hemoglobin 9.3 (L) 13.0 - 17.0 g/dL   HCT 29.8 (L) 39.0 - 52.0 %   MCV 90.0 80.0 - 100.0 fL   MCH 28.1 26.0 - 34.0 pg  MCHC 31.2 30.0 - 36.0 g/dL   RDW 13.7 11.5 - 15.5 %   Platelets 339 150 - 400 K/uL   nRBC 0.0 0.0 - 0.2 %    Comment: Performed at [facility], [address] [place], [place] [address]  Glucose, capillary     Status: Abnormal   Collection Time: 05/19/22  9:40 AM  Result Value Ref Range   Glucose-Capillary 62 (L) 70 - 99 mg/dL    Comment: Glucose reference range applies only to samples taken after fasting for at least 8 hours.    MR ANKLE RIGHT WO CONTRAST  Result Date: 05/18/2022 CLINICAL DATA:  Ankle pain, chronic, inflammatory arthritis suspected. Patient sent due to bone infection in right lateral malleolus. EXAM: MRI OF THE RIGHT ANKLE WITHOUT CONTRAST TECHNIQUE: Multiplanar, multisequence MR imaging of the ankle was performed. No intravenous contrast was administered. COMPARISON:  Radiographs dated February 11, 2022 FINDINGS: TENDONS Peroneal: Peroneal longus tendon intact. Peroneal brevis intact. Edema along the peroneus brevis and peroneus longus tendons consistent with tenosynovitis. Posteromedial: Posterior tibial tendon intact. Flexor hallucis longus tendon intact. Flexor digitorum longus tendon intact. Anterior: Tibialis anterior tendon intact. Extensor hallucis longus tendon intact Extensor digitorum longus tendon intact. Achilles: Intermediate intrasubstance signal of the distal Achilles tendon suggesting tendinosis without tear. Plantar Fascia: Intact. LIGAMENTS  Lateral: Anterior talofibular ligament intact. Calcaneofibular ligament intact. Posterior talofibular ligament intact. Anterior and posterior tibiofibular ligaments intact. Medial: Deltoid ligament intact. Spring ligament intact. CARTILAGE Ankle Joint: Small joint effusion. Normal ankle mortise. No chondral defect. Subtalar Joints/Sinus Tarsi: Degenerative changes with cartilage defect and subchondral cyst of the subtalar joint in the body of the talus. Normal sinus tarsi. Bones: Bone marrow edema of the lateral malleolus adjacent to the deep skin wound consistent with osteomyelitis. Soft Tissue: Generalized subcutaneous soft tissue edema about the medial and lateral aspect of the ankle/foot no drainable fluid collection or hematoma. Increased intra substance signal of the plantar muscles suggesting myopathy/myositis. Tarsal tunnel is normal. IMPRESSION: 1. Bone marrow edema of the lateral malleolus adjacent to the deep skin wound consistent with osteomyelitis. 2. Tenosynovitis of the peroneus brevis and peroneus longus tendons without evidence of tendon tear. 3. Generalized subcutaneous soft tissue edema about the medial and lateral aspect of the ankle/foot without drainable fluid collection or hematoma. 4. Increased intra substance signal of the plantar muscles suggesting myopathy/myositis. Electronically Signed   By: Keane Police D.O.   On: 05/18/2022 21:58   DG Chest 2 View  Result Date: 05/18/2022 CLINICAL DATA:  Suspected sepsis. EXAM: CHEST - 2 VIEW COMPARISON:  03/27/2022. FINDINGS: Unchanged fracture of the uppermost median sternotomy wire. Remaining wires appear intact. No focal airspace opacity. Stable postoperative changes of CABG. Normal heart size. Stable mediastinal contours with atherosclerotic calcifications and tortuosity of the aorta. No pleural effusion or pneumothorax. Visualized bones and upper abdomen are unremarkable. IMPRESSION: No acute cardiopulmonary disease. Electronically Signed    By: Emmit Alexanders M.D.   On: 05/18/2022 16:05    Review of Systems  Constitutional:  Negative for chills and fever.  HENT:  Negative for sinus pain and sore throat.   Respiratory:  Negative for cough and shortness of breath.   Cardiovascular:  Negative for chest pain and palpitations.  Gastrointestinal:  Negative for nausea and vomiting.  Genitourinary:  Negative for frequency and urgency.  Musculoskeletal:        Previous below-knee amputation on the left lower extremity.  Previous amputation of toes on the right foot.  Skin:  Relates a worsening ulceration on his right ankle.  Some increased drainage and discoloration.  Neurological:        Patient relates significant neuropathy associated with his diabetes.  Psychiatric/Behavioral:  Negative for confusion. The patient is not nervous/anxious.    Blood pressure 119/60, pulse 95, temperature 97.8 F (36.6 C), resp. rate 16, weight 135.5 kg, SpO2 96 %. Physical Exam Cardiovascular:     Comments: Trace pulses on the right.  Previous below-knee amputation left. Musculoskeletal:     Comments: Previous amputation right second and third toes.  Below-knee amputation left lower extremity.  Skin:    Comments: Necrotic appearing ulceration over the lateral malleolus area of the right ankle.  The skin is thin dry and atrophic with some edema and hyperpigmentation changes.  Absent hair growth.  Ulcer probes down to the level of bone at the lateral malleolus.  Neurological:     Comments: Loss of sensation in the right foot.     Assessment/Plan: Assessment: 1.  Full-thickness ulceration with osteomyelitis fibula right ankle. 2.  Diabetes with associated neuropathy. 3.  Peripheral vascular disease.  Plan: Vascular surgery has been consulted.  Most likely will plan for angiogram with intervention on Monday.  Patient will most likely require a below-knee amputation on the right lower extremity.  Podiatry will follow peripherally at this  point.  [name] 05/19/2022, 11:28 AM

## 2022-05-19 NOTE — Progress Notes (Signed)
Pharmacy Antibiotic Note  [name]. is a 67 y.o. male admitted on 05/18/2022 with suspected diabetic foot infection. Pharmacy has been consulted for vancomycin and cefepime dosing.  Plan:  Cefepime 2 g IV q8h  Vancomycin 2.5 g IV LD given yesterday evening  Start maintenance regimen of vancomycin 1.25 g IV q12h --Calculated AUC: 507, Cmin 14.8 --Daily Scr per protocol --Levels at steady state or as clinically indicated  Weight: 135.5 kg (298 lb 12.8 oz)  Temp (24hrs), Avg:97.9 F (36.6 C), Min:97.7 F (36.5 C), Max:98.1 F (36.7 C)  Recent Labs  Lab 05/15/22 1440 05/18/22 1527 05/18/22 2210 05/19/22 0531  WBC 12.2* 11.8*  --  12.2*  CREATININE  --  1.30*  --  1.07  LATICACIDVEN  --  1.5 1.3  --      Estimated Creatinine Clearance: 101.4 mL/min (by C-G formula based on SCr of 1.07 mg/dL).    Allergies  Allergen Reactions   Penicillins Other (See Comments)    Happened at 67 years old and pt. stated he passed out  He has tolerated amoxicillin/clavulanate and ampicillin/sulbactam   Statins     Other reaction(s): Muscle Pain Causes legs to ache per pt   Metformin And Related Diarrhea    Antimicrobials this admission: Vancomycin 1/19 >>  Cefepime 1/19 >>   Dose adjustments this admission: [name]  Microbiology results: 1/19 BCx: NGTD  Thank you for allowing pharmacy to be a part of this patient's care.  Benita Gutter 05/19/2022 7:35 AM

## 2022-05-19 NOTE — Progress Notes (Signed)
   05/19/22 1707  Vitals  Temp (!) 97.5 F (36.4 C)  BP 121/61  MAP (mmHg) 78  BP Location Left Arm  BP Method Automatic  Patient Position (if appropriate) Lying  Pulse Rate 91  Pulse Rate Source Monitor  Resp 16  MEWS COLOR  MEWS Score Color Green  Oxygen Therapy  SpO2 98 %  O2 Device Nasal Cannula  O2 Flow Rate (L/min) 2 L/min  MEWS Score  MEWS Temp 0  MEWS Systolic 0  MEWS Pulse 0  MEWS RR 0  MEWS LOC 0  MEWS Score 0   He had an episode of unresponsiveness, requiring multiple attempts of sternal rub. CBG 277. He is awake now and responding back to baseline.

## 2022-05-19 NOTE — Progress Notes (Signed)
PROGRESS NOTE  [name]. XAJ:[id number] DOB: [birth year] DOA: 05/18/2022 PCP: [name], MD  Hospital Course/Subjective: This is a pleasant 67 year old gentleman with a history of COPD, chronic respiratory failure on home O2, CAD status post CABG, insulin-dependent type 2 diabetes, OSA on BiPAP at night, BPH hypertension, hypothyroidism and paroxysmal atrial fibrillation on Eliquis and amiodarone who has peripheral arterial disease status post left BKA and is admitted to the hospital with concern for right ankle osteomyelitis after an injury in December 2023.  He was admitted to the hospitalist service overnight, treated with empiric IV cefepime and vancomycin.  Has been seen by dietary this morning, who recommends continuation of IV antibiotics and we are awaiting further evaluation from vascular surgery.  Patient was seen and examined this morning, resting comfortably, denies any pain or other complications.  Assessment/Plan: Acute osteomyelitis of right foot (HCC) Nonhealing wound right ankle PAD s/p left BKA Sent in by podiatry with concern for osteomyelitis, seen by Dr. Caryl Comes this morning and I personally discussed with him as well. Continue empiric cefepime and vancomycin  vascular consult for angiogram which we anticipate on Monday 1/22  Hyperkalemia Received a dose of Lokelma in the ED for potassium of 5.6, this is now resolved  AKI (acute kidney injury) ([facility]) Creatinine 1.3 at admission, now improving IV hydration and monitor.  Avoid nephrotoxins and renally dose all meds  Uncontrolled type 2 diabetes mellitus with hypoglycemia, with long-term current use of insulin (HCC) Blood sugar in the 300s on admission initially, he was hypoglycemic this morning Continue home basal insulin Sliding scale coverage  AF (paroxysmal atrial fibrillation) (HCC) Chronic anticoagulation Continue amiodarone Will hold apixaban in anticipation of possible procedure in the  coming days  COPD (chronic obstructive pulmonary disease) (HCC) OSA on home BiPAP Chronic respiratory failure on home O2 Patient is at baseline Continue home Pulmicort and inhalers Continue nighttime BiPAP and O2  CAD with Hx of CABG No complaints of chest pain and EKG nonacute Continue aspirin, ezetimibe, Imdur, losartan  Chronic diastolic CHF (congestive heart failure) (HCC) Clinically euvolemic Continue Lasix, losartan  Essential hypertension BP controlled.  Continue home antihypertensives  BPH (benign prostatic hyperplasia) Continue finasteride and tamsulosin  DVT Prophylaxis: Lovenox subcu daily, while Eliquis is being held  Code Status: Full code  Family Communication: No family present, plan of care discussed in detail with the patient, who is awake alert oriented x 4  Disposition Plan: Pending the above  Consultants: Vascular surgery Podiatry  Antimicrobials: Anti-infectives (From admission, onward)    Start     Dose/Rate Route Frequency Ordered Stop   05/19/22 1200  vancomycin (VANCOREADY) IVPB 1250 mg/250 mL        1,250 mg 166.7 mL/hr over 90 Minutes Intravenous Every 12 hours 05/19/22 0739     05/19/22 0100  vancomycin (VANCOREADY) IVPB 500 mg/100 mL       See Hyperspace for full Linked Orders Report.   500 mg 100 mL/hr over 60 Minutes Intravenous  Once 05/18/22 2209 05/19/22 0241   05/18/22 2300  vancomycin (VANCOREADY) IVPB 2000 mg/400 mL       See Hyperspace for full Linked Orders Report.   2,000 mg 200 mL/hr over 120 Minutes Intravenous  Once 05/18/22 2209 05/19/22 0138   05/18/22 2300  ceFEPIme (MAXIPIME) 2 g in sodium chloride 0.9 % 100 mL IVPB        2 g 200 mL/hr over 30 Minutes Intravenous Every 8 hours 05/18/22 2209  05/18/22 2210  vancomycin variable dose per unstable renal function (pharmacist dosing)         Does not apply See admin instructions 05/18/22 2211     05/18/22 2145  metroNIDAZOLE (FLAGYL) tablet 500 mg        500 mg Oral  Every 12 hours 05/18/22 2116 05/25/22 2144   05/18/22 2000  metroNIDAZOLE (FLAGYL) IVPB 500 mg  Status:  Discontinued        500 mg 100 mL/hr over 60 Minutes Intravenous  Once 05/18/22 1948 05/18/22 2144   05/18/22 2000  ceFEPIme (MAXIPIME) 2 g in sodium chloride 0.9 % 100 mL IVPB  Status:  Discontinued        2 g 200 mL/hr over 30 Minutes Intravenous  Once 05/18/22 1959 05/18/22 2209   05/18/22 2000  vancomycin (VANCOCIN) IVPB 1000 mg/200 mL premix  Status:  Discontinued        1,000 mg 200 mL/hr over 60 Minutes Intravenous  Once 05/18/22 1959 05/18/22 2209       Objective: Vitals:   05/18/22 2000 05/18/22 2150 05/19/22 0810 05/19/22 0844  BP: 139/76 132/75  119/60  Pulse: 85 84  95  Resp: '18 17  16  '$ Temp: 98 F (36.7 C) 98.1 F (36.7 C)  97.8 F (36.6 C)  TempSrc: Oral     SpO2: 97% 100% 98% 96%  Weight:  135.5 kg      Intake/Output Summary (Last 24 hours) at 05/19/2022 1040 Last data filed at 05/19/2022 0844 Gross per 24 hour  Intake 412.97 ml  Output 950 ml  Net -537.03 ml   Filed Weights   05/18/22 2150  Weight: 135.5 kg   Exam: General:  Alert, oriented, calm, in no acute distress, resting in bed this morning Eyes: EOMI, clear sclerea Neck: supple, no masses, trachea mildline  Cardiovascular: RRR, no murmurs or rubs, no peripheral edema  Respiratory: clear to auscultation bilaterally, no wheezes, no crackles  Abdomen: soft, nontender, nondistended, normal bowel tones heard  Skin: dry, no rashes  Musculoskeletal: He is status post left AKA, right foot with several toe amputations Psychiatric: appropriate affect, normal speech  Neurologic: extraocular muscles intact, clear speech, moving all extremities with intact sensorium   Data Reviewed: CBC: Recent Labs  Lab 05/15/22 1440 05/18/22 1527 05/19/22 0531  WBC 12.2* 11.8* 12.2*  NEUTROABS  --  9.9*  --   HGB 10.7* 10.8* 9.3*  HCT 33.4* 35.3* 29.8*  MCV 89 91.0 90.0  PLT 394 345 063   Basic Metabolic  Panel: Recent Labs  Lab 05/18/22 1527 05/19/22 0531  NA 134* 137  K 5.6* 4.6  CL 98 103  CO2 28 28  GLUCOSE 336* 144*  BUN 31* 28*  CREATININE 1.30* 1.07  CALCIUM 9.0 8.9   GFR: Estimated Creatinine Clearance: 101.4 mL/min (by C-G formula based on SCr of 1.07 mg/dL). Liver Function Tests: Recent Labs  Lab 05/18/22 1527  AST 18  ALT 25  ALKPHOS 170*  BILITOT 0.3  PROT 7.4  ALBUMIN 2.6*   No results for input(s): "LIPASE", "AMYLASE" in the last 168 hours. No results for input(s): "AMMONIA" in the last 168 hours. Coagulation Profile: Recent Labs  Lab 05/18/22 1527  INR 1.3*   Cardiac Enzymes: No results for input(s): "CKTOTAL", "CKMB", "CKMBINDEX", "TROPONINI" in the last 168 hours. BNP (last 3 results) No results for input(s): "PROBNP" in the last 8760 hours. HbA1C: No results for input(s): "HGBA1C" in the last 72 hours. CBG: Recent Labs  Lab  05/18/22 2149 05/19/22 0058 05/19/22 0355 05/19/22 0940  GLUCAP 427* 303* 207* 62*   Lipid Profile: No results for input(s): "CHOL", "HDL", "LDLCALC", "TRIG", "CHOLHDL", "LDLDIRECT" in the last 72 hours. Thyroid Function Tests: No results for input(s): "TSH", "T4TOTAL", "FREET4", "T3FREE", "THYROIDAB" in the last 72 hours. Anemia Panel: No results for input(s): "VITAMINB12", "FOLATE", "FERRITIN", "TIBC", "IRON", "RETICCTPCT" in the last 72 hours. Urine analysis:    Component Value Date/Time   COLORURINE YELLOW (A) 03/27/2022 0450   APPEARANCEUR CLEAR (A) 03/27/2022 0450   LABSPEC 1.014 03/27/2022 0450   PHURINE 6.0 03/27/2022 0450   GLUCOSEU 150 (A) 03/27/2022 0450   HGBUR NEGATIVE 03/27/2022 0450   BILIRUBINUR NEGATIVE 03/27/2022 0450   KETONESUR 20 (A) 03/27/2022 0450   PROTEINUR 100 (A) 03/27/2022 0450   NITRITE NEGATIVE 03/27/2022 0450   LEUKOCYTESUR TRACE (A) 03/27/2022 0450   Sepsis Labs: '@LABRCNTIP'$ (procalcitonin:4,lacticidven:4)  ) Recent Results (from the past 240 hour(s))  Culture, blood (Routine  x 2)     Status: None (Preliminary result)   Collection Time: 05/18/22  3:27 PM   Specimen: BLOOD  Result Value Ref Range Status   Specimen Description BLOOD RIGHT [facility]  Final   Special Requests   Final    BOTTLES DRAWN AEROBIC AND ANAEROBIC Blood Culture adequate volume   Culture   Final    NO GROWTH < 24 HOURS Performed at [facility], [address], [place], [place] [address]    Report Status PENDING  Incomplete  Culture, blood (Routine X 2) w Reflex to ID Panel     Status: None (Preliminary result)   Collection Time: 05/18/22 10:10 PM   Specimen: BLOOD  Result Value Ref Range Status   Specimen Description BLOOD BLOOD LEFT ARM  Final   Special Requests   Final    BOTTLES DRAWN AEROBIC AND ANAEROBIC Blood Culture adequate volume   Culture   Final    NO GROWTH < 12 HOURS Performed at [facility], [address], [place], [place] [address]    Report Status PENDING  Incomplete     Studies: MR ANKLE RIGHT WO CONTRAST  Result Date: 05/18/2022 CLINICAL DATA:  Ankle pain, chronic, inflammatory arthritis suspected. Patient sent due to bone infection in right lateral malleolus. EXAM: MRI OF THE RIGHT ANKLE WITHOUT CONTRAST TECHNIQUE: Multiplanar, multisequence MR imaging of the ankle was performed. No intravenous contrast was administered. COMPARISON:  Radiographs dated February 11, 2022 FINDINGS: TENDONS Peroneal: Peroneal longus tendon intact. Peroneal brevis intact. Edema along the peroneus brevis and peroneus longus tendons consistent with tenosynovitis. Posteromedial: Posterior tibial tendon intact. Flexor hallucis longus tendon intact. Flexor digitorum longus tendon intact. Anterior: Tibialis anterior tendon intact. Extensor hallucis longus tendon intact Extensor digitorum longus tendon intact. Achilles: Intermediate intrasubstance signal of the distal Achilles tendon suggesting tendinosis without tear. Plantar Fascia: Intact. LIGAMENTS Lateral: Anterior talofibular  ligament intact. Calcaneofibular ligament intact. Posterior talofibular ligament intact. Anterior and posterior tibiofibular ligaments intact. Medial: Deltoid ligament intact. Spring ligament intact. CARTILAGE Ankle Joint: Small joint effusion. Normal ankle mortise. No chondral defect. Subtalar Joints/Sinus Tarsi: Degenerative changes with cartilage defect and subchondral cyst of the subtalar joint in the body of the talus. Normal sinus tarsi. Bones: Bone marrow edema of the lateral malleolus adjacent to the deep skin wound consistent with osteomyelitis. Soft Tissue: Generalized subcutaneous soft tissue edema about the medial and lateral aspect of the ankle/foot no drainable fluid collection or hematoma. Increased intra substance signal of the plantar muscles suggesting myopathy/myositis. Tarsal tunnel  is normal. IMPRESSION: 1. Bone marrow edema of the lateral malleolus adjacent to the deep skin wound consistent with osteomyelitis. 2. Tenosynovitis of the peroneus brevis and peroneus longus tendons without evidence of tendon tear. 3. Generalized subcutaneous soft tissue edema about the medial and lateral aspect of the ankle/foot without drainable fluid collection or hematoma. 4. Increased intra substance signal of the plantar muscles suggesting myopathy/myositis. Electronically Signed   By: Keane Police D.O.   On: 05/18/2022 21:58   DG Chest 2 View  Result Date: 05/18/2022 CLINICAL DATA:  Suspected sepsis. EXAM: CHEST - 2 VIEW COMPARISON:  03/27/2022. FINDINGS: Unchanged fracture of the uppermost median sternotomy wire. Remaining wires appear intact. No focal airspace opacity. Stable postoperative changes of CABG. Normal heart size. Stable mediastinal contours with atherosclerotic calcifications and tortuosity of the aorta. No pleural effusion or pneumothorax. Visualized bones and upper abdomen are unremarkable. IMPRESSION: No acute cardiopulmonary disease. Electronically Signed   By: Emmit Alexanders M.D.   On:  05/18/2022 16:05    Scheduled Meds:  amiodarone  200 mg Oral Daily   aspirin EC  81 mg Oral Daily   budesonide  0.5 mg Nebulization BID   cyanocobalamin  5,000 mcg Oral Daily   enoxaparin (LOVENOX) injection  0.5 mg/kg Subcutaneous Q24H   ferrous sulfate  325 mg Oral Q breakfast   finasteride  5 mg Oral Daily   furosemide  40 mg Oral Daily   insulin aspart  0-20 Units Subcutaneous Q4H   insulin glargine-yfgn  25 Units Subcutaneous QHS   isosorbide mononitrate  60 mg Oral Daily   losartan  50 mg Oral Daily   metroNIDAZOLE  500 mg Oral Q12H   risperiDONE  0.5 mg Oral BID   sodium zirconium cyclosilicate  10 g Oral Once   tamsulosin  0.4 mg Oral Daily   vancomycin variable dose per unstable renal function (pharmacist dosing)   Does not apply See admin instructions    Continuous Infusions:  ceFEPime (MAXIPIME) IV 2 g (05/19/22 0603)   vancomycin       LOS: 1 day   Time spent: 26 minutes  Cleave Ternes Marry Guan, MD [facility] Pager [phone]  If 7PM-7AM, please contact night-coverage [email or web address] Password [email or web address] 05/19/2022, 10:40 AM

## 2022-05-19 NOTE — Progress Notes (Signed)
Initial Nutrition Assessment  DOCUMENTATION CODES:   Obesity unspecified  INTERVENTION:  Continue current diet as ordered Encourage PO intake Ensure Max po BID, each supplement provides 150 kcal and 30 grams of protein.  MVI with minerals daily Vitamin C '250mg'$  x 30 days and zinc '220mg'$  x 14 days for wound healing 1 packet Juven BID, each packet provides 95 calories, 2.5 grams of protein (collagen), and 9.8 grams of carbohydrate (3 grams sugar); also contains 7 grams of L-arginine and L-glutamine, 300 mg vitamin C, 15 mg vitamin E, 1.2 mcg vitamin B-12, 9.5 mg zinc, 200 mg calcium, and 1.5 g  Calcium Beta-hydroxy-Beta-methylbutyrate to support wound healing  NUTRITION DIAGNOSIS:   Increased nutrient needs related to wound healing as evidenced by estimated needs.  GOAL:   Patient will meet greater than or equal to 90% of their needs  MONITOR:   PO intake, Supplement acceptance, Skin, Labs  REASON FOR ASSESSMENT:   Consult Wound healing  ASSESSMENT:   Pt with hx of CHF, COPD, CAD, DM type 2, gout, HLD, HTN, hx MI, PVD and left BKA presented to ED with worsening ankle wound. Found to have osteomyelitis.   Attempted to call room phone, no answer at this time. Reviewed chart and no intake recorded at this time and weight appears to be relatively stable with no major weight loss noted.   Podiatry evaluated and notes that wound probes to the bone. Vascular surgery consulting with likely plan for angiogram on Monday. Pt at high risk for right BKA.   Will add wound healing interventions in anticipation of surgery. Rd to follow-up as planned for physical exam and nutrition hx.   Nutritionally Relevant Medications: Scheduled Meds:  cyanocobalamin  5,000 mcg Oral Daily   ferrous sulfate  325 mg Oral Q breakfast   furosemide  40 mg Oral Daily   insulin aspart  0-20 Units Subcutaneous Q4H   insulin glargine-yfgn  25 Units Subcutaneous QHS   metroNIDAZOLE  500 mg Oral Q12H   sodium  zirconium cyclosilicate  10 g Oral Once   vancomycin variable dose   Does not apply See admin instructions   Continuous Infusions:  ceFEPime (MAXIPIME) IV 2 g (05/19/22 0603)   vancomycin     PRN Meds: ondansetron  Labs Reviewed: BUN 28 CBG ranges from 62-427 mg/dL over the last 24 hours HgbA1c 8.9% (03/15/22)  NUTRITION - FOCUSED PHYSICAL EXAM: Defer to in-person assessment  Diet Order:   Diet Order             Diet Carb Modified Fluid consistency: Thin; Room service appropriate? Yes  Diet effective now                   EDUCATION NEEDS:   Education needs have been addressed  Skin:  Skin Assessment: Reviewed RN Assessment (stage 3 to the right ankle)  Last BM:  PTA  Height:   Ht Readings from Last 1 Encounters:  05/15/22 6' 4.5" (1.943 m)    Weight:   Wt Readings from Last 1 Encounters:  05/18/22 135.5 kg    Ideal Body Weight:  86.4 kg (adjusted by 5.9% for BKA)  BMI:  Body mass index is 38.1 kg/m. (Adjusted by 5.9% for BKA using 135.5kg)  Estimated Nutritional Needs:   Kcal:  2400-2700 kcal/d  Protein:  120-150g/d  Fluid:  2.2-2.4L/d    Ranell Patrick, RD, LDN Clinical Dietitian RD pager # available in [facility]  After hours/weekend pager # available in [facility]

## 2022-05-20 DIAGNOSIS — S81809A Unspecified open wound, unspecified lower leg, initial encounter: Secondary | ICD-10-CM | POA: Diagnosis not present

## 2022-05-20 LAB — BASIC METABOLIC PANEL
Anion gap: 7 (ref 5–15)
BUN: 28 mg/dL — ABNORMAL HIGH (ref 8–23)
CO2: 25 mmol/L (ref 22–32)
Calcium: 8.7 mg/dL — ABNORMAL LOW (ref 8.9–10.3)
Chloride: 101 mmol/L (ref 98–111)
Creatinine, Ser: 0.9 mg/dL (ref 0.61–1.24)
GFR, Estimated: >60 mL/min (ref >60)
Glucose, Bld: 101 mg/dL — ABNORMAL HIGH (ref 70–99)
Potassium: 4.7 mmol/L (ref 3.5–5.1)
Sodium: 133 mmol/L — ABNORMAL LOW (ref 135–145)

## 2022-05-20 LAB — CBC
HCT: 31.9 % — ABNORMAL LOW (ref 39.0–52.0)
Hemoglobin: 10.1 g/dL — ABNORMAL LOW (ref 13.0–17.0)
MCH: 28.2 pg (ref 26.0–34.0)
MCHC: 31.7 g/dL (ref 30.0–36.0)
MCV: 89.1 fL (ref 80.0–100.0)
Platelets: 323 10*3/uL (ref 150–400)
RBC: 3.58 MIL/uL — ABNORMAL LOW (ref 4.22–5.81)
RDW: 13.9 % (ref 11.5–15.5)
WBC: 13.2 10*3/uL — ABNORMAL HIGH (ref 4.0–10.5)
nRBC: 0 % (ref 0.0–0.2)

## 2022-05-20 LAB — GLUCOSE, CAPILLARY
Glucose-Capillary: 112 mg/dL — ABNORMAL HIGH (ref 70–99)
Glucose-Capillary: 185 mg/dL — ABNORMAL HIGH (ref 70–99)
Glucose-Capillary: 188 mg/dL — ABNORMAL HIGH (ref 70–99)
Glucose-Capillary: 199 mg/dL — ABNORMAL HIGH (ref 70–99)
Glucose-Capillary: 229 mg/dL — ABNORMAL HIGH (ref 70–99)
Glucose-Capillary: 92 mg/dL (ref 70–99)

## 2022-05-20 LAB — URINALYSIS, ROUTINE W REFLEX MICROSCOPIC
Bacteria, UA: NONE SEEN
Bilirubin Urine: NEGATIVE
Glucose, UA: 50 mg/dL — AB
Hgb urine dipstick: NEGATIVE
Ketones, ur: NEGATIVE mg/dL
Leukocytes,Ua: NEGATIVE
Nitrite: NEGATIVE
Protein, ur: 100 mg/dL — AB
Specific Gravity, Urine: 1.013 (ref 1.005–1.030)
pH: 6 (ref 5.0–8.0)

## 2022-05-20 NOTE — Progress Notes (Signed)
PROGRESS NOTE  [name]. QPR:[id number] DOB: 09-Jul-1955 DOA: 05/18/2022 PCP: [name], MD  Hospital Course/Subjective: This is a pleasant 67 year old gentleman with a history of COPD, chronic respiratory failure on home O2, CAD status post CABG, insulin-dependent type 2 diabetes, OSA on BiPAP at night, BPH hypertension, hypothyroidism and paroxysmal atrial fibrillation on Eliquis and amiodarone who has peripheral arterial disease status post left BKA and is admitted to the hospital with concern for right ankle osteomyelitis after an injury in December 2023.  He was seen in consultation by podiatry, who recommends continuation of empiric IV antibiotics, awaiting vascular surgery intervention likely 1/22.  Assessment/Plan: Acute osteomyelitis of right foot (HCC) Nonhealing wound right ankle PAD s/p left BKA Sent in by podiatry with concern for osteomyelitis, seen by Dr. Caryl Comes 1/20and I personally discussed with him as well. Continue empiric cefepime and vancomycin  vascular consult for angiogram which we anticipate on Monday 1/22  Hyperkalemia Received a dose of Lokelma in the ED for potassium of 5.6, this is now resolved  AKI (acute kidney injury) ([place]) Creatinine 1.3 at admission, now improving IV hydration and monitor.  Avoid nephrotoxins and renally dose all meds  Uncontrolled type 2 diabetes mellitus with hypoglycemia, with long-term current use of insulin (HCC) Blood sugar in the 300s on admission initially, he was hypoglycemic this morning Continue home basal insulin Sliding scale coverage  AF (paroxysmal atrial fibrillation) (HCC) Chronic anticoagulation Continue amiodarone Will hold apixaban in anticipation of possible procedure in the coming days  COPD (chronic obstructive pulmonary disease) (HCC) OSA on home BiPAP Chronic respiratory failure on home O2 Patient is at baseline Continue home Pulmicort and inhalers Continue BiPAP and O2 at night and  anytime he is sleeping  CAD with Hx of CABG No complaints of chest pain and EKG nonacute Continue aspirin, ezetimibe, Imdur, losartan  Chronic diastolic CHF (congestive heart failure) (HCC) Clinically euvolemic Continue Lasix, losartan  Essential hypertension BP controlled.  Continue home antihypertensives  BPH (benign prostatic hyperplasia) Continue finasteride and tamsulosin  DVT Prophylaxis: Lovenox subcu daily, while Eliquis is being held  Code Status: Full code  Family Communication: No family present, plan of care discussed in detail with the patient, who is awake alert oriented x 4  Disposition Plan: Pending the above  Consultants: Vascular surgery Podiatry  Antimicrobials: Anti-infectives (From admission, onward)    Start     Dose/Rate Route Frequency Ordered Stop   05/19/22 1200  vancomycin (VANCOREADY) IVPB 1250 mg/250 mL        1,250 mg 166.7 mL/hr over 90 Minutes Intravenous Every 12 hours 05/19/22 0739     05/19/22 0100  vancomycin (VANCOREADY) IVPB 500 mg/100 mL       See Hyperspace for full Linked Orders Report.   500 mg 100 mL/hr over 60 Minutes Intravenous  Once 05/18/22 2209 05/19/22 0241   05/18/22 2300  vancomycin (VANCOREADY) IVPB 2000 mg/400 mL       See Hyperspace for full Linked Orders Report.   2,000 mg 200 mL/hr over 120 Minutes Intravenous  Once 05/18/22 2209 05/19/22 0138   05/18/22 2300  ceFEPIme (MAXIPIME) 2 g in sodium chloride 0.9 % 100 mL IVPB        2 g 200 mL/hr over 30 Minutes Intravenous Every 8 hours 05/18/22 2209     05/18/22 2210  vancomycin variable dose per unstable renal function (pharmacist dosing)  Status:  Discontinued         Does not apply See admin  instructions 05/18/22 2211 05/20/22 0908   05/18/22 2145  metroNIDAZOLE (FLAGYL) tablet 500 mg        500 mg Oral Every 12 hours 05/18/22 2116 05/25/22 2144   05/18/22 2000  metroNIDAZOLE (FLAGYL) IVPB 500 mg  Status:  Discontinued        500 mg 100 mL/hr over 60 Minutes  Intravenous  Once 05/18/22 1948 05/18/22 2144   05/18/22 2000  ceFEPIme (MAXIPIME) 2 g in sodium chloride 0.9 % 100 mL IVPB  Status:  Discontinued        2 g 200 mL/hr over 30 Minutes Intravenous  Once 05/18/22 1959 05/18/22 2209   05/18/22 2000  vancomycin (VANCOCIN) IVPB 1000 mg/200 mL premix  Status:  Discontinued        1,000 mg 200 mL/hr over 60 Minutes Intravenous  Once 05/18/22 1959 05/18/22 2209       Objective: Vitals:   05/19/22 1707 05/19/22 2235 05/20/22 0833 05/20/22 0843  BP: 121/61 (!) 110/58 125/68   Pulse: 91 98 88   Resp: '16 16 17   '$ Temp: (!) 97.5 F (36.4 C) 98.5 F (36.9 C) 99.5 F (37.5 C)   TempSrc:      SpO2: 98% 95% 96% 94%  Weight:        Intake/Output Summary (Last 24 hours) at 05/20/2022 1111 Last data filed at 05/20/2022 0839 Gross per 24 hour  Intake 1040 ml  Output 5150 ml  Net -4110 ml    Filed Weights   05/18/22 2150  Weight: 135.5 kg   Exam: General:  Alert, oriented, calm, in no acute distress, sleeping on my arrival and slow to wake up Eyes: EOMI, clear conjuctivae, white sclerea Neck: supple, no masses, trachea mildline  Cardiovascular: RRR, no murmurs or rubs, no peripheral edema  Respiratory: clear to auscultation bilaterally, no wheezes, no crackles  Abdomen: soft, nontender, nondistended, normal bowel tones heard  Skin: dry, no rashes  Musculoskeletal: no joint effusions, status post left lower extremity amputation Psychiatric: appropriate affect, normal speech  Neurologic: extraocular muscles intact, clear speech, moving all extremities with intact sensorium  Data Reviewed: CBC: Recent Labs  Lab 05/15/22 1440 05/18/22 1527 05/19/22 0531 05/20/22 0642  WBC 12.2* 11.8* 12.2* 13.2*  NEUTROABS  --  9.9*  --   --   HGB 10.7* 10.8* 9.3* 10.1*  HCT 33.4* 35.3* 29.8* 31.9*  MCV 89 91.0 90.0 89.1  PLT 394 345 339 250    Basic Metabolic Panel: Recent Labs  Lab 05/18/22 1527 05/19/22 0531 05/20/22 0642  NA 134* 137  133*  K 5.6* 4.6 4.7  CL 98 103 101  CO2 '28 28 25  '$ GLUCOSE 336* 144* 101*  BUN 31* 28* 28*  CREATININE 1.30* 1.07 0.90  CALCIUM 9.0 8.9 8.7*    GFR: Estimated Creatinine Clearance: 120.5 mL/min (by C-G formula based on SCr of 0.9 mg/dL). Liver Function Tests: Recent Labs  Lab 05/18/22 1527  AST 18  ALT 25  ALKPHOS 170*  BILITOT 0.3  PROT 7.4  ALBUMIN 2.6*    No results for input(s): "LIPASE", "AMYLASE" in the last 168 hours. No results for input(s): "AMMONIA" in the last 168 hours. Coagulation Profile: Recent Labs  Lab 05/18/22 1527  INR 1.3*    Cardiac Enzymes: No results for input(s): "CKTOTAL", "CKMB", "CKMBINDEX", "TROPONINI" in the last 168 hours. BNP (last 3 results) No results for input(s): "PROBNP" in the last 8760 hours. HbA1C: No results for input(s): "HGBA1C" in the last 72 hours. CBG: Recent  Labs  Lab 05/19/22 1707 05/19/22 2036 05/20/22 0042 05/20/22 0451 05/20/22 0756  GLUCAP 277* 278* 185* 112* 92    Lipid Profile: No results for input(s): "CHOL", "HDL", "LDLCALC", "TRIG", "CHOLHDL", "LDLDIRECT" in the last 72 hours. Thyroid Function Tests: No results for input(s): "TSH", "T4TOTAL", "FREET4", "T3FREE", "THYROIDAB" in the last 72 hours. Anemia Panel: No results for input(s): "VITAMINB12", "FOLATE", "FERRITIN", "TIBC", "IRON", "RETICCTPCT" in the last 72 hours. Urine analysis:    Component Value Date/Time   COLORURINE YELLOW (A) 05/20/2022 0134   APPEARANCEUR CLEAR (A) 05/20/2022 0134   LABSPEC 1.013 05/20/2022 0134   PHURINE 6.0 05/20/2022 0134   GLUCOSEU 50 (A) 05/20/2022 0134   HGBUR NEGATIVE 05/20/2022 0134   BILIRUBINUR NEGATIVE 05/20/2022 0134   KETONESUR NEGATIVE 05/20/2022 0134   PROTEINUR 100 (A) 05/20/2022 0134   NITRITE NEGATIVE 05/20/2022 0134   LEUKOCYTESUR NEGATIVE 05/20/2022 0134   Sepsis Labs: '@LABRCNTIP'$ (procalcitonin:4,lacticidven:4)  ) Recent Results (from the past 240 hour(s))  Culture, blood (Routine x 2)      Status: None (Preliminary result)   Collection Time: 05/18/22  3:27 PM   Specimen: BLOOD  Result Value Ref Range Status   Specimen Description BLOOD RIGHT [facility]  Final   Special Requests   Final    BOTTLES DRAWN AEROBIC AND ANAEROBIC Blood Culture adequate volume   Culture   Final    NO GROWTH 2 DAYS Performed at [facility], [address] [place], [place] [address]    Report Status PENDING  Incomplete  Culture, blood (Routine X 2) w Reflex to ID Panel     Status: None (Preliminary result)   Collection Time: 05/18/22 10:10 PM   Specimen: BLOOD  Result Value Ref Range Status   Specimen Description BLOOD BLOOD LEFT ARM  Final   Special Requests   Final    BOTTLES DRAWN AEROBIC AND ANAEROBIC Blood Culture adequate volume   Culture   Final    NO GROWTH 2 DAYS Performed at [facility], [address] [place], [place] [address]    Report Status PENDING  Incomplete     Studies: No results found.  Scheduled Meds:  amiodarone  200 mg Oral Daily   ascorbic acid  250 mg Oral Daily   aspirin EC  81 mg Oral Daily   budesonide  0.5 mg Nebulization BID   cyanocobalamin  5,000 mcg Oral Daily   enoxaparin (LOVENOX) injection  0.5 mg/kg Subcutaneous Q24H   ferrous sulfate  325 mg Oral Q breakfast   finasteride  5 mg Oral Daily   furosemide  40 mg Oral Daily   insulin aspart  0-20 Units Subcutaneous Q4H   insulin glargine-yfgn  25 Units Subcutaneous QHS   isosorbide mononitrate  60 mg Oral Daily   losartan  50 mg Oral Daily   metroNIDAZOLE  500 mg Oral Q12H   multivitamin with minerals  1 tablet Oral Daily   nutrition supplement (JUVEN)  1 packet Oral BID BM   Ensure Max Protein  11 oz Oral BID   risperiDONE  0.5 mg Oral BID   sodium zirconium cyclosilicate  10 g Oral Once   tamsulosin  0.4 mg Oral Daily   zinc sulfate  220 mg Oral Daily    Continuous Infusions:  ceFEPime (MAXIPIME) IV 2 g (05/20/22 0617)   vancomycin 1,250 mg (05/20/22 1109)     LOS: 2  days   Time spent: 31 minutes  Connery Shiffler Marry Guan, MD [facility] Pager [phone]  If  7PM-7AM, please contact night-coverage [email or web address] Password [email or web address] 05/20/2022, 11:11 AM

## 2022-05-20 NOTE — Inpatient Diabetes Management (Signed)
Inpatient Diabetes Program Recommendations  AACE/ADA: New Consensus Statement on Inpatient Glycemic Control (2015)  Target Ranges:  Prepandial:   less than 140 mg/dL      Peak postprandial:   less than 180 mg/dL (1-2 hours)      Critically ill patients:  140 - 180 mg/dL   Lab Results  Component Value Date   GLUCAP 92 05/20/2022   HGBA1C 8.9 (H) 03/15/2022    Review of Glycemic Control  Latest Reference Range & Units 05/19/22 16:42 05/19/22 17:07 05/19/22 20:36 05/20/22 00:42 05/20/22 04:51 05/20/22 07:56  Glucose-Capillary 70 - 99 mg/dL 285 (H) 277 (H) 278 (H) 185 (H) 112 (H) 92  (H): Data is abnormally high  Diabetes history: DM2 Outpatient Diabetes medications:  Semglee 40 QAM Novolog 15 units TID if >200 mg/dL Current orders for Inpatient glycemic control:  Semglee 25 units QHS Novolog 0-20 units Q4H  Inpatient Diabetes Program Recommendations:    Has a diet order.  Please consider:  Novolog 0-15 units TID and 0-5 units QHS Novolog 3 units TID with meals if he consumes at least 50%  Will continue to follow while inpatient.  Thank you, Reche Dixon, MSN, [place] Diabetes Coordinator Inpatient Diabetes Program [phone] (team pager from 8a-5p)

## 2022-05-21 ENCOUNTER — Encounter
Admission: EM | Disposition: A | Discharge status: Home Health | Payer: Self-pay | Source: Ambulatory Visit | Attending: Osteopathic Medicine

## 2022-05-21 DIAGNOSIS — L089 Local infection of the skin and subcutaneous tissue, unspecified: Secondary | ICD-10-CM

## 2022-05-21 DIAGNOSIS — M869 Osteomyelitis, unspecified: Secondary | ICD-10-CM | POA: Diagnosis not present

## 2022-05-21 DIAGNOSIS — I739 Peripheral vascular disease, unspecified: Secondary | ICD-10-CM | POA: Diagnosis not present

## 2022-05-21 DIAGNOSIS — Z89512 Acquired absence of left leg below knee: Secondary | ICD-10-CM

## 2022-05-21 DIAGNOSIS — I70235 Atherosclerosis of native arteries of right leg with ulceration of other part of foot: Secondary | ICD-10-CM

## 2022-05-21 DIAGNOSIS — L97519 Non-pressure chronic ulcer of other part of right foot with unspecified severity: Secondary | ICD-10-CM

## 2022-05-21 DIAGNOSIS — S81809A Unspecified open wound, unspecified lower leg, initial encounter: Secondary | ICD-10-CM | POA: Diagnosis not present

## 2022-05-21 HISTORY — PX: LOWER EXTREMITY ANGIOGRAPHY: CATH118251

## 2022-05-21 LAB — CBC
HCT: 31.6 % — ABNORMAL LOW (ref 39.0–52.0)
Hemoglobin: 9.9 g/dL — ABNORMAL LOW (ref 13.0–17.0)
MCH: 27.9 pg (ref 26.0–34.0)
MCHC: 31.3 g/dL (ref 30.0–36.0)
MCV: 89 fL (ref 80.0–100.0)
Platelets: 332 10*3/uL (ref 150–400)
RBC: 3.55 MIL/uL — ABNORMAL LOW (ref 4.22–5.81)
RDW: 14 % (ref 11.5–15.5)
WBC: 11.7 10*3/uL — ABNORMAL HIGH (ref 4.0–10.5)
nRBC: 0 % (ref 0.0–0.2)

## 2022-05-21 LAB — GLUCOSE, CAPILLARY
Glucose-Capillary: 112 mg/dL — ABNORMAL HIGH (ref 70–99)
Glucose-Capillary: 118 mg/dL — ABNORMAL HIGH (ref 70–99)
Glucose-Capillary: 135 mg/dL — ABNORMAL HIGH (ref 70–99)
Glucose-Capillary: 177 mg/dL — ABNORMAL HIGH (ref 70–99)
Glucose-Capillary: 216 mg/dL — ABNORMAL HIGH (ref 70–99)
Glucose-Capillary: 251 mg/dL — ABNORMAL HIGH (ref 70–99)
Glucose-Capillary: 256 mg/dL — ABNORMAL HIGH (ref 70–99)
Glucose-Capillary: 289 mg/dL — ABNORMAL HIGH (ref 70–99)

## 2022-05-21 LAB — BASIC METABOLIC PANEL
Anion gap: 9 (ref 5–15)
BUN: 33 mg/dL — ABNORMAL HIGH (ref 8–23)
CO2: 25 mmol/L (ref 22–32)
Calcium: 9.1 mg/dL (ref 8.9–10.3)
Chloride: 101 mmol/L (ref 98–111)
Creatinine, Ser: 0.99 mg/dL (ref 0.61–1.24)
GFR, Estimated: >60 mL/min (ref >60)
Glucose, Bld: 167 mg/dL — ABNORMAL HIGH (ref 70–99)
Potassium: 4.3 mmol/L (ref 3.5–5.1)
Sodium: 135 mmol/L (ref 135–145)

## 2022-05-21 SURGERY — LOWER EXTREMITY ANGIOGRAPHY
Anesthesia: Moderate Sedation | Laterality: Right

## 2022-05-21 MED ORDER — IODIXANOL 320 MG/ML IV SOLN
INTRAVENOUS | Status: DC | PRN
  Administered 2022-05-21: 35 mL

## 2022-05-21 MED ORDER — HEPARIN SODIUM (PORCINE) 1000 UNIT/ML IJ SOLN
INTRAMUSCULAR | Status: DC | PRN
  Administered 2022-05-21: 5000 [IU] via INTRAVENOUS

## 2022-05-21 MED ORDER — MIDAZOLAM HCL 2 MG/ML PO SYRP: 8.0000 mg | ORAL_SOLUTION | Freq: Once | ORAL | Status: DC | PRN

## 2022-05-21 MED ORDER — FENTANYL CITRATE (PF) 100 MCG/2ML IJ SOLN
INTRAMUSCULAR | Status: AC
  Filled 2022-05-21: qty 2

## 2022-05-21 MED ORDER — FAMOTIDINE 20 MG PO TABS: 40.0000 mg | ORAL_TABLET | Freq: Once | ORAL | Status: DC | PRN

## 2022-05-21 MED ORDER — FENTANYL CITRATE PF 50 MCG/ML IJ SOSY: 12.5000 ug | PREFILLED_SYRINGE | Freq: Once | INTRAMUSCULAR | Status: DC | PRN

## 2022-05-21 MED ORDER — HEPARIN SODIUM (PORCINE) 1000 UNIT/ML IJ SOLN
INTRAMUSCULAR | Status: AC
  Filled 2022-05-21: qty 10

## 2022-05-21 MED ORDER — ONDANSETRON HCL 4 MG/2ML IJ SOLN: 4.0000 mg | Freq: Four times a day (QID) | INTRAMUSCULAR | Status: DC | PRN

## 2022-05-21 MED ORDER — VITAMIN C 500 MG PO TABS
500.0000 mg | ORAL_TABLET | Freq: Two times a day (BID) | ORAL | Status: DC
  Administered 2022-05-21 – 2022-06-06 (×31): 500 mg via ORAL
  Filled 2022-05-21 (×31): qty 1

## 2022-05-21 MED ORDER — METHYLPREDNISOLONE SODIUM SUCC 125 MG IJ SOLR: 125.0000 mg | Freq: Once | INTRAMUSCULAR | Status: DC | PRN

## 2022-05-21 MED ORDER — DIPHENHYDRAMINE HCL 50 MG/ML IJ SOLN: 50.0000 mg | Freq: Once | INTRAMUSCULAR | Status: DC | PRN

## 2022-05-21 MED ORDER — FENTANYL CITRATE (PF) 100 MCG/2ML IJ SOLN
INTRAMUSCULAR | Status: DC | PRN
  Administered 2022-05-21: 50 ug via INTRAVENOUS

## 2022-05-21 MED ORDER — CLOPIDOGREL BISULFATE 75 MG PO TABS
75.0000 mg | ORAL_TABLET | Freq: Every day | ORAL | Status: DC
  Administered 2022-05-21 – 2022-05-29 (×8): 75 mg via ORAL
  Filled 2022-05-21 (×8): qty 1

## 2022-05-21 MED ORDER — ENSURE MAX PROTEIN PO LIQD
11.0000 [oz_av] | Freq: Every day | ORAL | Status: DC
  Administered 2022-05-23 – 2022-06-05 (×11): 11 [oz_av] via ORAL
  Filled 2022-05-21 (×16): qty 330

## 2022-05-21 MED ORDER — CIPROFLOXACIN IN D5W 200 MG/100ML IV SOLN
INTRAVENOUS | Status: DC | PRN
  Administered 2022-05-21: 400 mg via INTRAVENOUS

## 2022-05-21 MED ORDER — SODIUM CHLORIDE 0.9 % IV SOLN: INTRAVENOUS | Status: DC

## 2022-05-21 MED ORDER — MIDAZOLAM HCL 2 MG/2ML IJ SOLN
INTRAMUSCULAR | Status: DC | PRN
  Administered 2022-05-21: 2 mg via INTRAVENOUS

## 2022-05-21 MED ORDER — MIDAZOLAM HCL 5 MG/5ML IJ SOLN
INTRAMUSCULAR | Status: AC
  Filled 2022-05-21: qty 5

## 2022-05-21 MED ORDER — HYDROMORPHONE HCL 1 MG/ML IJ SOLN: 1.0000 mg | Freq: Once | INTRAMUSCULAR | Status: DC | PRN

## 2022-05-21 MED ORDER — CIPROFLOXACIN IN D5W 400 MG/200ML IV SOLN
400.0000 mg | INTRAVENOUS | Status: DC
  Filled 2022-05-21: qty 200

## 2022-05-21 MED ORDER — CHLORHEXIDINE GLUCONATE 4 % EX LIQD: 1.0000 | Freq: Once | CUTANEOUS | Status: DC

## 2022-05-21 SURGICAL SUPPLY — 16 items
BALLN LUTONIX 018 4X150X130 (BALLOONS) ×1
BALLOON LUTONIX 018 4X150X130 (BALLOONS) IMPLANT
CATH ANGIO 5F PIGTAIL 65CM (CATHETERS) IMPLANT
CATH NAVICROSS ANGLED 135CM (MICROCATHETER) IMPLANT
COVER DRAPE FLUORO 36X44 (DRAPES) IMPLANT
COVER PROBE U/S 5X48 (MISCELLANEOUS) IMPLANT
DEVICE STARCLOSE SE CLOSURE (Vascular Products) IMPLANT
GUIDEWIRE PFTE-COATED .018X300 (WIRE) IMPLANT
KIT ENCORE 26 ADVANTAGE (KITS) IMPLANT
PACK ANGIOGRAPHY (CUSTOM PROCEDURE TRAY) ×1 IMPLANT
SHEATH BRITE TIP 5FRX11 (SHEATH) IMPLANT
SHEATH RAABE 6FRX70 (SHEATH) IMPLANT
SYR MEDRAD MARK 7 150ML (SYRINGE) IMPLANT
TUBING CONTRAST HIGH PRESS 72 (TUBING) IMPLANT
WIRE GUIDERIGHT .035X150 (WIRE) IMPLANT
WIRE SUPRACORE 300CM (MISCELLANEOUS) IMPLANT

## 2022-05-21 NOTE — TOC Progression Note (Signed)
Transition of Care (TOC) - Progression Note    Patient Details  Name: [name]. MRN: [id number] Date of Birth: 1956/03/02  Transition of Care [facility]) CM/SW [place], RN Phone Number: 05/21/2022, 1:24 PM  Clinical Narrative:     The patient is to have Angio today, TOC to follow for needs and assist       Expected Discharge Plan and Services                                               Social Determinants of Health (SDOH) Interventions SDOH Screenings   Food Insecurity: No Food Insecurity (05/19/2022)  Housing: Low Risk  (05/19/2022)  Transportation Needs: No Transportation Needs (05/19/2022)  Recent Concern: Transportation Needs - Unmet Transportation Needs (03/21/2022)  Utilities: Not At Risk (05/19/2022)  Depression (PHQ2-9): Low Risk  (11/21/2021)  Tobacco Use: Medium Risk (05/18/2022)    Readmission Risk Interventions    02/13/2022    4:57 PM 09/29/2021   11:12 AM  Readmission Risk Prevention Plan  Transportation Screening Complete Complete  PCP or Specialist Appt within 3-5 Days  Complete  HRI or [place]  Complete  Social Work Consult for [place] Planning/Counseling  Complete  Palliative Care Screening  Not Applicable  Medication Review Press photographer) Complete Complete  PCP or Specialist appointment within 3-5 days of discharge Complete   HRI or [place] Complete   SW Recovery Care/Counseling Consult Complete   [place] Not Applicable

## 2022-05-21 NOTE — Consult Note (Signed)
Hospital Consult    Reason for Consult:  Right Lower extremity Ulcer Requesting Physician:  Dr Dwaine Gale Marry Guan MD  MRN #:  [id number]  History of Present Illness: This is a 67 y.o. male with a history of COPD, chronic respiratory failure on home O2, CAD status post CABG, insulin-dependent type 2 diabetes, OSA on BiPAP at night, BPH hypertension, hypothyroidism and paroxysmal atrial fibrillation on Eliquis and amiodarone who has peripheral arterial disease status post left BKA and is admitted to the hospital with concern for right ankle osteomyelitis after an injury in December 2023.  He was seen in consultation by podiatry, who recommends continuation of empiric IV antibiotics, awaiting vascular surgery intervention likely 1/22.   Patient resting comfortably in bed this morning.  No complaints overnight.  Vital signs all remained stable.  Pictures taken of the right lower extremity wound placed in media.  Past Medical History:  Diagnosis Date   Arrhythmia    atrial fibrillation   Asthma    CHF (congestive heart failure) (HCC)    COPD (chronic obstructive pulmonary disease) (HCC)    Coronary artery disease    Depression    Diabetes mellitus without complication (HCC)    Gout    History anabolic steroid use    Hyperlipidemia    Hypertension    Hypogonadism in male    MI (myocardial infarction) ([place])    Morbid obesity ([place])    Myocardial infarction ([place])    Peripheral vascular disease ([place])    Perirectal abscess    Pleurisy    Sleep apnea    CPAP at night, no oxygen   Varicella     Past Surgical History:  Procedure Laterality Date   ABDOMINAL AORTIC ANEURYSM REPAIR     ACHILLES TENDON SURGERY Left 01/10/2021   Procedure: ACHILLES LENGTHENING/KIDNER;  Surgeon: Caroline More, DPM;  Location: [facility];  Service: Podiatry;  Laterality: Left;   AMPUTATION Left 10/03/2021   Procedure: AMPUTATION BELOW KNEE;  Surgeon: Katha Cabal, MD;  Location: [facility];  Service:  Vascular;  Laterality: Left;   AMPUTATION TOE Right 02/10/2016   Procedure: AMPUTATION TOE 3RD TOE;  Surgeon: Samara Deist, DPM;  Location: [facility];  Service: Podiatry;  Laterality: Right;   AMPUTATION TOE Left 02/24/2020   Procedure: AMPUTATION TOE;  Surgeon: Caroline More, DPM;  Location: [facility];  Service: Podiatry;  Laterality: Left;   APPLICATION OF WOUND VAC Left 02/29/2020   Procedure: APPLICATION OF WOUND VAC;  Surgeon: Caroline More, DPM;  Location: [facility];  Service: Podiatry;  Laterality: Left;   CARDIAC CATHETERIZATION     CARDIOVERSION [name] 02/13/2022   Procedure: CARDIOVERSION;  Surgeon: Corey Skains, MD;  Location: [facility];  Service: Cardiovascular;  Laterality: [name];   COLONOSCOPY WITH PROPOFOL [name] 11/18/2015   Procedure: COLONOSCOPY WITH PROPOFOL;  Surgeon: Manya Silvas, MD;  Location: [facility];  Service: Endoscopy;  Laterality: [name];   CORONARY ARTERY BYPASS GRAFT     CORONARY STENT INTERVENTION [name] 02/02/2020   Procedure: CORONARY STENT INTERVENTION;  Surgeon: Isaias Cowman, MD;  Location: [place] [facility];  Service: Cardiovascular;  Laterality: [name];   INCISION AND DRAINAGE Left 08/07/2021   Procedure: INCISION AND DRAINAGE-Partial Calcanectomy;  Surgeon: Caroline More, DPM;  Location: [facility];  Service: Podiatry;  Laterality: Left;   IRRIGATION AND DEBRIDEMENT FOOT Left 02/29/2020   Procedure: IRRIGATION AND DEBRIDEMENT FOOT;  Surgeon: Caroline More, DPM;  Location: [facility];  Service: Podiatry;  Laterality: Left;   IRRIGATION AND DEBRIDEMENT  FOOT Left 02/24/2020   Procedure: IRRIGATION AND DEBRIDEMENT FOOT;  Surgeon: Caroline More, DPM;  Location: [facility];  Service: Podiatry;  Laterality: Left;   KNEE ARTHROSCOPY     LEFT HEART CATH AND CORS/GRAFTS ANGIOGRAPHY [name] 02/02/2020   Procedure: LEFT HEART CATH AND CORS/GRAFTS ANGIOGRAPHY;  Surgeon: Teodoro Spray, MD;  Location: [place] [facility];  Service: Cardiovascular;  Laterality: [name];    LOWER EXTREMITY ANGIOGRAPHY Left 02/25/2020   Procedure: Lower Extremity Angiography;  Surgeon: Algernon Huxley, MD;  Location: [place] [facility];  Service: Cardiovascular;  Laterality: Left;   LOWER EXTREMITY ANGIOGRAPHY Left 01/04/2021   Procedure: LOWER EXTREMITY ANGIOGRAPHY;  Surgeon: Algernon Huxley, MD;  Location: [place] [facility];  Service: Cardiovascular;  Laterality: Left;   METATARSAL HEAD EXCISION Left 01/10/2021   Procedure: METATARSAL HEAD EXCISION - LEFT 5th;  Surgeon: Caroline More, DPM;  Location: [facility];  Service: Podiatry;  Laterality: Left;   PERIPHERAL VASCULAR CATHETERIZATION Right 01/24/2016   Procedure: Lower Extremity Angiography;  Surgeon: Katha Cabal, MD;  Location: [place] [facility];  Service: Cardiovascular;  Laterality: Right;   PERIPHERAL VASCULAR CATHETERIZATION Right 01/25/2016   Procedure: Lower Extremity Angiography;  Surgeon: Katha Cabal, MD;  Location: [place] [facility];  Service: Cardiovascular;  Laterality: Right;   TOE AMPUTATION     TONSILLECTOMY      Allergies  Allergen Reactions   Penicillins Other (See Comments)    Happened at 67 years old and pt. stated he passed out  He has tolerated amoxicillin/clavulanate and ampicillin/sulbactam   Statins     Other reaction(s): Muscle Pain Causes legs to ache per pt   Metformin And Related Diarrhea    Prior to Admission medications   Medication Sig Start Date End Date Taking? Authorizing Provider  acetaminophen (TYLENOL) 325 MG tablet Take 2 tablets (650 mg total) by mouth every 6 (six) hours as needed for mild pain (or Fever >/= 101). Patient taking differently: Take 650 mg by mouth every 4 (four) hours as needed for mild pain (or Fever >/= 101). 11/10/21   Emeterio Reeve, DO  amiodarone (PACERONE) 200 MG tablet Take 200 mg by mouth daily.    [provider]  apixaban (ELIQUIS) 5 MG TABS tablet Take 5 mg by mouth 2 (two) times daily.    [provider]  aspirin EC  81 MG tablet Take 81 mg by mouth daily. Swallow whole.    [provider]  budesonide (PULMICORT) 0.5 MG/2ML nebulizer solution Take 2 mLs (0.5 mg total) by nebulization 2 (two) times daily. 11/10/21   Emeterio Reeve, DO  Cholecalciferol 25 MCG (1000 UT) tablet Take 1,000 Units by mouth daily.    [provider]  Cyanocobalamin (VITAMIN B-12) 5000 MCG TBDP Take 10,000 mcg by mouth daily.    [provider]  diltiazem (TIAZAC) 300 MG 24 hr capsule Take 300 mg by mouth daily.    [provider]  ezetimibe (ZETIA) 10 MG tablet Take 10 mg by mouth at bedtime.    [provider]  ferrous sulfate 325 (65 FE) MG tablet Take 325 mg by mouth daily with breakfast.    [provider]  finasteride (PROSCAR) 5 MG tablet Take 1 tablet (5 mg total) by mouth daily. 11/11/21   Emeterio Reeve, DO  furosemide (LASIX) 20 MG tablet Take 2 tablets (40 mg total) by mouth daily. 02/15/22   Fritzi Mandes, MD  gabapentin (NEURONTIN) 300 MG capsule Take 300 mg  by mouth at bedtime.    [provider]  HUMALOG 100 UNIT/ML injection Inject 15 Units into the skin 3 (three) times daily with meals. >200 02/05/22   [provider]  insulin glargine-yfgn (SEMGLEE) 100 UNIT/ML injection Inject 0.25 mLs (25 Units total) into the skin at bedtime. Patient taking differently: Inject 40 Units into the skin every morning. 11/10/21   Emeterio Reeve, DO  isosorbide mononitrate (IMDUR) 60 MG 24 hr tablet Take 60 mg by mouth in the morning and at bedtime.    [provider]  levothyroxine (SYNTHROID) 25 MCG tablet Take 1 tablet (25 mcg total) by mouth daily at 6 (six) AM. 11/11/21   Emeterio Reeve, DO  losartan (COZAAR) 50 MG tablet Take 50 mg by mouth daily.    [provider]  Multiple Vitamins-Minerals (MULTIVITAMIN WITH MINERALS) tablet Take 1 tablet by mouth daily.    [provider]  nitrofurantoin, macrocrystal-monohydrate,  (MACROBID) 100 MG capsule Take 100 mg by mouth daily.    [provider]  pramipexole (MIRAPEX) 1 MG tablet Take 2 mg by mouth at bedtime.    [provider]  primidone (MYSOLINE) 250 MG tablet Take 250 mg by mouth 2 (two) times daily. 01/23/20   [provider]  risperiDONE (RISPERDAL) 0.5 MG tablet Take 1 tablet (0.5 mg total) by mouth 2 (two) times daily. 11/10/21   Emeterio Reeve, DO  tamsulosin (FLOMAX) 0.4 MG CAPS capsule Take 0.4 mg by mouth daily.    [provider]  traMADol (ULTRAM) 50 MG tablet Take 1 tablet (50 mg total) by mouth daily as needed for moderate pain. 08/11/21   Fritzi Mandes, MD  vitamin C (ASCORBIC ACID) 500 MG tablet Take 500 mg by mouth daily.    [provider]    Social History   Socioeconomic History   Marital status: Married    Spouse name: Not on file   Number of children: Not on file   Years of education: Not on file   Highest education level: Not on file  Occupational History   Not on file  Tobacco Use   Smoking status: Former    Packs/day: 0.50    Years: 45.00    Total pack years: 22.50    Types: Cigarettes    Quit date: 04/05/2015    Years since quitting: 7.1   Smokeless tobacco: Never  Vaping Use   Vaping Use: Every day  Substance and Sexual Activity   Alcohol use: Not Currently    Alcohol/week: 3.0 standard drinks of alcohol    Types: 3 Glasses of wine per week   Drug use: No   Sexual activity: Not on file  Other Topics Concern   Not on file  Social History Narrative   Not on file   Social Determinants of Health   Financial Resource Strain: Not on file  Food Insecurity: No Food Insecurity (05/19/2022)   Hunger Vital Sign    Worried About Running Out of Food in the Last Year: Never true    Ran Out of Food in the Last Year: Never true  Transportation Needs: No Transportation Needs (05/19/2022)   PRAPARE - Hydrologist (Medical): No    Lack of Transportation  (Non-Medical): No  Recent Concern: Transportation Needs - Unmet Transportation Needs (03/21/2022)   PRAPARE - Hydrologist (Medical): Yes    Lack of Transportation (Non-Medical): Yes  Physical Activity: Not on file  Stress: Not  on file  Social Connections: Not on file  Intimate Partner Violence: Not At Risk (05/19/2022)   Humiliation, Afraid, Rape, and Kick questionnaire    Fear of Current or Ex-Partner: No    Emotionally Abused: No    Physically Abused: No    Sexually Abused: No     Family History  Problem Relation Age of Onset   Hypertension Father    Coronary artery disease Father    Alcohol abuse Father    Heart failure Brother     ROS: Otherwise negative unless mentioned in HPI  Physical Examination  Vitals:   05/20/22 1719 05/21/22 0039  BP: 119/64 127/69  Pulse: 89 92  Resp: 17 20  Temp: 99.5 F (37.5 C) 99.7 F (37.6 C)  SpO2: 95% 93%   Body mass index is 35.9 kg/m.  General:  WDWN in NAD Gait: Not observed HENT: WNL, normocephalic Pulmonary: normal non-labored breathing, without Rales, rhonchi,  wheezing Cardiac: irregular, without  Murmurs, rubs or gallops; without carotid bruits Abdomen: Positive bowel sounds throughout soft, NT/ND, no masses Skin: without rashes Vascular Exam/Pulses: Left BKA from 4 years ago, right lower extremity cool to touch with open sore on right lateral ankle. Extremities: with ischemic changes, without Gangrene , with cellulitis; with open wounds;  Musculoskeletal: no muscle wasting or atrophy  Neurologic: A&O X 3;  No focal weakness or paresthesias are detected; speech is fluent/normal Psychiatric:  The pt has Normal affect. Lymph:  Unremarkable  CBC    Component Value Date/Time   WBC 11.7 (H) 05/21/2022 0431   RBC 3.55 (L) 05/21/2022 0431   HGB 9.9 (L) 05/21/2022 0431   HGB 10.7 (L) 05/15/2022 1440   HCT 31.6 (L) 05/21/2022 0431   HCT 33.4 (L) 05/15/2022 1440   PLT 332 05/21/2022 0431    PLT 394 05/15/2022 1440   MCV 89.0 05/21/2022 0431   MCV 89 05/15/2022 1440   MCV 96 07/12/2011 0919   MCH 27.9 05/21/2022 0431   MCHC 31.3 05/21/2022 0431   RDW 14.0 05/21/2022 0431   RDW 12.5 05/15/2022 1440   RDW 15.2 (H) 07/12/2011 0919   LYMPHSABS 1.0 05/18/2022 1527   LYMPHSABS 1.2 07/12/2011 0919   MONOABS 0.6 05/18/2022 1527   MONOABS 0.8 (H) 07/12/2011 0919   EOSABS 0.2 05/18/2022 1527   EOSABS 0.4 07/12/2011 0919   BASOSABS 0.1 05/18/2022 1527   BASOSABS 0.1 07/12/2011 0919    BMET    Component Value Date/Time   NA 135 05/21/2022 0431   NA 141 07/12/2011 0919   K 4.3 05/21/2022 0431   K 4.4 07/12/2011 0919   CL 101 05/21/2022 0431   CL 102 07/12/2011 0919   CO2 25 05/21/2022 0431   CO2 29 07/12/2011 0919   GLUCOSE 167 (H) 05/21/2022 0431   GLUCOSE 76 07/12/2011 0919   BUN 33 (H) 05/21/2022 0431   BUN 16 05/05/2013 1022   CREATININE 0.99 05/21/2022 0431   CREATININE 0.71 05/05/2013 1022   CALCIUM 9.1 05/21/2022 0431   CALCIUM 8.8 07/12/2011 0919   GFRNONAA >60 05/21/2022 0431   GFRNONAA >60 05/05/2013 1022   GFRAA >60 01/06/2020 1853   GFRAA >60 05/05/2013 1022    COAGS: Lab Results  Component Value Date   INR 1.3 (H) 05/18/2022   INR 1.4 (H) 03/16/2022   INR 1.6 (H) 03/15/2022     Non-Invasive Vascular Imaging:   none  Statin:  Yes.   Beta Blocker:  Yes.   Aspirin:  Yes.  ACEI:  No. ARB:  No. CCB use:  Yes Other antiplatelets/anticoagulants:  Yes.   Eliquis   ASSESSMENT/PLAN: This is a 67 y.o. male who was scheduled with Dr. Ella Jubilee on 05/29/2022 for right lower extremity angiogram.  Presented to the hospital with an open wound and requested to be seen sooner than his scheduled angiogram.  Pictures were taken and placed in the media section of his chart.  Patient's right lower extremity is cool to the touch.  I cannot find palpable posttibial pulses.  Plan: Patient will be taken to the vascular lab later this afternoon for right lower  extremity angiogram with Dr. [name].  We discussed the procedure, benefits, risks, and the complications.  Answered all the patient's questions this morning.  He verbalizes understanding of the procedure.  He would like to proceed as soon as possible.  Patient was made n.p.o. earlier this morning.  Patient's potassium this morning is 4.3, blood urea nitrogen is 33 and his creatinine is 0.99    Drema Pry [facility] 05/21/2022 8:23 AM

## 2022-05-21 NOTE — Progress Notes (Signed)
Nutrition Follow-up  DOCUMENTATION CODES:   Obesity unspecified  INTERVENTION:   -Continue MVI with minerals daily -500 mg vitamin C BID -220 mg zinc sulfate daily x 14 days -Continue -1 packet Juven BID, each packet provides 95 calories, 2.5 grams of protein (collagen), and 9.8 grams of carbohydrate (3 grams sugar); also contains 7 grams of L-arginine and L-glutamine, 300 mg vitamin C, 15 mg vitamin E, 1.2 mcg vitamin B-12, 9.5 mg zinc, 200 mg calcium, and 1.5 g  Calcium Beta-hydroxy-Beta-methylbutyrate to support wound healing  -Double protein portions with meals -Ensure Max po daily, each supplement provides 150 kcal and 30 grams of protein.    NUTRITION DIAGNOSIS:   Increased nutrient needs related to wound healing as evidenced by estimated needs.  Ongoing  GOAL:   Patient will meet greater than or equal to 90% of their needs  Progressing   MONITOR:   PO intake, Supplement acceptance, Skin, Labs  REASON FOR ASSESSMENT:   Consult Wound healing  ASSESSMENT:   Pt with hx of CHF, COPD, CAD, DM type 2, gout, HLD, HTN, hx MI, PVD and left BKA presented to ED with worsening ankle wound. Found to have osteomyelitis.  Reviewed I/O's: -4.1 L x 24 hours and -8.1 L since admission  UOP: 4.5 L x 24 hours   Reviewed wt hx; pt has experienced a 7.8% wt loss over the past 3 months, which is significant for time frame.   Per vascular surgery notes, plan for rt lower extremity angiogram today. Pt currently NPO for procedure.   Pt previously on a carb modified diet. Noted meal completions 100%.   Pt lying in bed at time of visit, sleeping soundly. He did not awaken to voice.  or touch.   Reviewed wt hx; pt has experienced a 7.8% wt loss over the past 3 months, which is significant for time frame. Suspect wt loss may be related to amputation as well as diuresis.   Medications reviewed and include vitamin B-12, lasix, 0.9% sodium chloride infusion @ 75 ml/hr, and lokelma.    Labs reviewed: CBGS: 118-177 (inpatient orders for glycemic control are 0-20 units insulin aspart every 4 hours and 25 units insulin glargine-yfgn daily).    NUTRITION - FOCUSED PHYSICAL EXAM:  Flowsheet Row Most Recent Value  Orbital Region No depletion  Upper Arm Region Mild depletion  Thoracic and Lumbar Region No depletion  Buccal Region No depletion  Temple Region No depletion  Clavicle Bone Region No depletion  Clavicle and Acromion Bone Region No depletion  Scapular Bone Region No depletion  Dorsal Hand No depletion  Patellar Region No depletion  Anterior Thigh Region No depletion  Posterior Calf Region No depletion  Edema (RD Assessment) Mild  Hair Reviewed  Eyes Reviewed  Mouth Reviewed  Skin Reviewed  Nails Reviewed       Diet Order:   Diet Order             Diet NPO time specified  Diet effective midnight           Diet NPO time specified Except for: Sips with Meds  Diet effective now                   EDUCATION NEEDS:   Education needs have been addressed  Skin:  Skin Assessment: Skin Integrity Issues: Skin Integrity Issues:: Stage III Stage III: rt ankle  Last BM:  05/21/22 (type 6)  Height:   Ht Readings from Last 1 Encounters:  05/15/22 6'  4.5" (1.943 m)    Weight:   Wt Readings from Last 1 Encounters:  05/18/22 135.5 kg    Ideal Body Weight:  86.4 kg (adjusted by 5.9% for BKA)  BMI:  Body mass index is 35.9 kg/m.  Estimated Nutritional Needs:   Kcal:  2400-2600  Protein:  130-145 grams  Fluid:  > 2 L    Loistine Chance, RD, LDN, [place] Registered Dietitian II Certified Diabetes Care and Education Specialist Please refer to [facility] for RD and/or RD on-call/weekend/after hours pager

## 2022-05-21 NOTE — Progress Notes (Signed)
PROGRESS NOTE  [name]. NWG:[id number] DOB: [birth year] DOA: 05/18/2022 PCP: [name], MD  Hospital Course/Subjective: This is a pleasant 67 year old gentleman with a history of COPD, chronic respiratory failure on home O2, CAD status post CABG, insulin-dependent type 2 diabetes, OSA on BiPAP at night, BPH hypertension, hypothyroidism and paroxysmal atrial fibrillation on Eliquis and amiodarone who has peripheral arterial disease status post left BKA and is admitted to the hospital with concern for right ankle osteomyelitis after an injury in December 2023.  He was seen in consultation by podiatry, who recommends continuation of empiric IV antibiotics, awaiting vascular surgery intervention today 1/22.  Seen and examined this morning with his wife at the bedside, he has been n.p.o. awaiting angiogram today.  Assessment/Plan: Acute osteomyelitis of right foot (HCC) Nonhealing wound right ankle PAD s/p left BKA Sent in by podiatry with concern for osteomyelitis, seen by Dr. Caryl Comes 1/20 and I personally discussed with him as well. Continue empiric cefepime and vancomycin  Angiogram today with vascular surgery  Hyperkalemia Received a dose of Lokelma in the ED for potassium of 5.6, this is now resolved  AKI (acute kidney injury) ([place]) Creatinine 1.3 at admission, now improving IV hydration and monitor.  Avoid nephrotoxins and renally dose all meds  Uncontrolled type 2 diabetes mellitus with hypoglycemia, with long-term current use of insulin (HCC) Blood sugar in the 300s on admission initially, he was hypoglycemic this morning Continue home basal insulin Sliding scale coverage  AF (paroxysmal atrial fibrillation) (HCC) Chronic anticoagulation Continue amiodarone Will hold apixaban in anticipation of possible procedure in the coming days  COPD (chronic obstructive pulmonary disease) (HCC) OSA on home BiPAP Chronic respiratory failure on home O2 Patient is at  baseline Continue home Pulmicort and inhalers Continue BiPAP and O2 at night and anytime he is sleeping  CAD with Hx of CABG No complaints of chest pain and EKG nonacute Continue aspirin, ezetimibe, Imdur, losartan  Chronic diastolic CHF (congestive heart failure) (HCC) Clinically euvolemic Continue Lasix, losartan  Essential hypertension BP controlled.  Continue home antihypertensives  BPH (benign prostatic hyperplasia) Continue finasteride and tamsulosin  DVT Prophylaxis: Lovenox subcu daily, while Eliquis is being held  Code Status: Full code  Family Communication: Discussed with patient and his wife this morning at the bedside.  Disposition Plan: Pending the above  Consultants: Vascular surgery Podiatry  Antimicrobials: Anti-infectives (From admission, onward)    Start     Dose/Rate Route Frequency Ordered Stop   05/19/22 1200  vancomycin (VANCOREADY) IVPB 1250 mg/250 mL        1,250 mg 166.7 mL/hr over 90 Minutes Intravenous Every 12 hours 05/19/22 0739     05/19/22 0100  vancomycin (VANCOREADY) IVPB 500 mg/100 mL       See Hyperspace for full Linked Orders Report.   500 mg 100 mL/hr over 60 Minutes Intravenous  Once 05/18/22 2209 05/19/22 0241   05/18/22 2300  vancomycin (VANCOREADY) IVPB 2000 mg/400 mL       See Hyperspace for full Linked Orders Report.   2,000 mg 200 mL/hr over 120 Minutes Intravenous  Once 05/18/22 2209 05/19/22 0138   05/18/22 2300  ceFEPIme (MAXIPIME) 2 g in sodium chloride 0.9 % 100 mL IVPB        2 g 200 mL/hr over 30 Minutes Intravenous Every 8 hours 05/18/22 2209     05/18/22 2210  vancomycin variable dose per unstable renal function (pharmacist dosing)  Status:  Discontinued  Does not apply See admin instructions 05/18/22 2211 05/20/22 0908   05/18/22 2145  metroNIDAZOLE (FLAGYL) tablet 500 mg        500 mg Oral Every 12 hours 05/18/22 2116 05/25/22 2144   05/18/22 2000  metroNIDAZOLE (FLAGYL) IVPB 500 mg  Status:   Discontinued        500 mg 100 mL/hr over 60 Minutes Intravenous  Once 05/18/22 1948 05/18/22 2144   05/18/22 2000  ceFEPIme (MAXIPIME) 2 g in sodium chloride 0.9 % 100 mL IVPB  Status:  Discontinued        2 g 200 mL/hr over 30 Minutes Intravenous  Once 05/18/22 1959 05/18/22 2209   05/18/22 2000  vancomycin (VANCOCIN) IVPB 1000 mg/200 mL premix  Status:  Discontinued        1,000 mg 200 mL/hr over 60 Minutes Intravenous  Once 05/18/22 1959 05/18/22 2209       Objective: Vitals:   05/20/22 0843 05/20/22 1719 05/21/22 0039 05/21/22 0835  BP:  119/64 127/69 126/68  Pulse:  89 92 90  Resp:  '17 20 18  '$ Temp:  99.5 F (37.5 C) 99.7 F (37.6 C) 98.3 F (36.8 C)  TempSrc:      SpO2: 94% 95% 93% 92%  Weight:        Intake/Output Summary (Last 24 hours) at 05/21/2022 1031 Last data filed at 05/21/2022 5035 Gross per 24 hour  Intake --  Output 3875 ml  Net -3875 ml    Filed Weights   05/18/22 2150  Weight: 135.5 kg   Exam: General:  Alert, oriented, calm, in no acute distress  Eyes: EOMI, clear conjuctivae, white sclerea Neck: supple, no masses, trachea mildline  Cardiovascular: RRR, no murmurs or rubs, no peripheral edema  Respiratory: clear to auscultation bilaterally, no wheezes, no crackles  Abdomen: soft, nontender, nondistended, normal bowel tones heard  Skin: dry, no rashes  Musculoskeletal: no joint effusions, normal range of motion, status post left BKA Psychiatric: appropriate affect, normal speech  Neurologic: extraocular muscles intact, clear speech, moving all extremities with intact sensorium  Data Reviewed: CBC: Recent Labs  Lab 05/15/22 1440 05/18/22 1527 05/19/22 0531 05/20/22 0642 05/21/22 0431  WBC 12.2* 11.8* 12.2* 13.2* 11.7*  NEUTROABS  --  9.9*  --   --   --   HGB 10.7* 10.8* 9.3* 10.1* 9.9*  HCT 33.4* 35.3* 29.8* 31.9* 31.6*  MCV 89 91.0 90.0 89.1 89.0  PLT 394 345 339 323 465    Basic Metabolic Panel: Recent Labs  Lab 05/18/22 1527  05/19/22 0531 05/20/22 0642 05/21/22 0431  NA 134* 137 133* 135  K 5.6* 4.6 4.7 4.3  CL 98 103 101 101  CO2 '28 28 25 25  '$ GLUCOSE 336* 144* 101* 167*  BUN 31* 28* 28* 33*  CREATININE 1.30* 1.07 0.90 0.99  CALCIUM 9.0 8.9 8.7* 9.1    GFR: Estimated Creatinine Clearance: 109.6 mL/min (by C-G formula based on SCr of 0.99 mg/dL). Liver Function Tests: Recent Labs  Lab 05/18/22 1527  AST 18  ALT 25  ALKPHOS 170*  BILITOT 0.3  PROT 7.4  ALBUMIN 2.6*    No results for input(s): "LIPASE", "AMYLASE" in the last 168 hours. No results for input(s): "AMMONIA" in the last 168 hours. Coagulation Profile: Recent Labs  Lab 05/18/22 1527  INR 1.3*    Cardiac Enzymes: No results for input(s): "CKTOTAL", "CKMB", "CKMBINDEX", "TROPONINI" in the last 168 hours. BNP (last 3 results) No results for input(s): "PROBNP" in the  last 8760 hours. HbA1C: No results for input(s): "HGBA1C" in the last 72 hours. CBG: Recent Labs  Lab 05/20/22 1620 05/20/22 2033 05/21/22 0039 05/21/22 0422 05/21/22 0809  GLUCAP 199* 229* 216* 177* 135*    Lipid Profile: No results for input(s): "CHOL", "HDL", "LDLCALC", "TRIG", "CHOLHDL", "LDLDIRECT" in the last 72 hours. Thyroid Function Tests: No results for input(s): "TSH", "T4TOTAL", "FREET4", "T3FREE", "THYROIDAB" in the last 72 hours. Anemia Panel: No results for input(s): "VITAMINB12", "FOLATE", "FERRITIN", "TIBC", "IRON", "RETICCTPCT" in the last 72 hours. Urine analysis:    Component Value Date/Time   COLORURINE YELLOW (A) 05/20/2022 0134   APPEARANCEUR CLEAR (A) 05/20/2022 0134   LABSPEC 1.013 05/20/2022 0134   PHURINE 6.0 05/20/2022 0134   GLUCOSEU 50 (A) 05/20/2022 0134   HGBUR NEGATIVE 05/20/2022 0134   BILIRUBINUR NEGATIVE 05/20/2022 0134   KETONESUR NEGATIVE 05/20/2022 0134   PROTEINUR 100 (A) 05/20/2022 0134   NITRITE NEGATIVE 05/20/2022 0134   LEUKOCYTESUR NEGATIVE 05/20/2022 0134   Sepsis  Labs: '@LABRCNTIP'$ (procalcitonin:4,lacticidven:4)  ) Recent Results (from the past 240 hour(s))  Culture, blood (Routine x 2)     Status: None (Preliminary result)   Collection Time: 05/18/22  3:27 PM   Specimen: BLOOD  Result Value Ref Range Status   Specimen Description BLOOD RIGHT [facility]  Final   Special Requests   Final    BOTTLES DRAWN AEROBIC AND ANAEROBIC Blood Culture adequate volume   Culture   Final    NO GROWTH 3 DAYS Performed at [facility], [address], [place], [place] [address]    Report Status PENDING  Incomplete  Culture, blood (Routine X 2) w Reflex to ID Panel     Status: None (Preliminary result)   Collection Time: 05/18/22 10:10 PM   Specimen: BLOOD  Result Value Ref Range Status   Specimen Description BLOOD BLOOD LEFT ARM  Final   Special Requests   Final    BOTTLES DRAWN AEROBIC AND ANAEROBIC Blood Culture adequate volume   Culture   Final    NO GROWTH 3 DAYS Performed at [facility], [address], [place], [place] [address]    Report Status PENDING  Incomplete     Studies: No results found.  Scheduled Meds:  amiodarone  200 mg Oral Daily   ascorbic acid  250 mg Oral Daily   aspirin EC  81 mg Oral Daily   budesonide  0.5 mg Nebulization BID   cyanocobalamin  5,000 mcg Oral Daily   enoxaparin (LOVENOX) injection  0.5 mg/kg Subcutaneous Q24H   ferrous sulfate  325 mg Oral Q breakfast   finasteride  5 mg Oral Daily   furosemide  40 mg Oral Daily   insulin aspart  0-20 Units Subcutaneous Q4H   insulin glargine-yfgn  25 Units Subcutaneous QHS   isosorbide mononitrate  60 mg Oral Daily   losartan  50 mg Oral Daily   metroNIDAZOLE  500 mg Oral Q12H   multivitamin with minerals  1 tablet Oral Daily   nutrition supplement (JUVEN)  1 packet Oral BID BM   Ensure Max Protein  11 oz Oral BID   risperiDONE  0.5 mg Oral BID   sodium zirconium cyclosilicate  10 g Oral Once   tamsulosin  0.4 mg Oral Daily   zinc sulfate  220 mg Oral  Daily    Continuous Infusions:  sodium chloride 75 mL/hr at 05/21/22 0900   ceFEPime (MAXIPIME) IV 2 g (05/21/22 0816)   vancomycin 1,250 mg (05/21/22  0116)     LOS: 3 days   Time spent: 31 minutes  [name] Marry Guan, MD [facility] Pager [phone]  If 7PM-7AM, please contact night-coverage [email or web address] Password [facility] 05/21/2022, 10:31 AM

## 2022-05-21 NOTE — Progress Notes (Signed)
MD spoke with pt. And his wife re: procedural results. Both verbalized understanding of conversation with MD.

## 2022-05-21 NOTE — Op Note (Signed)
[facility]  Percutaneous Study/Intervention Procedural Note   Date of Surgery: 05/21/2022  Surgeon(s):Ryett Hamman    Assistants:none  Pre-operative Diagnosis: PAD with ulceration and infection right lower extremity  Post-operative diagnosis:  Same  Procedure(s) Performed:             1.  Ultrasound guidance for vascular access left femoral artery             2.  Catheter placement into right SFA from left femoral approach             3.  Aortogram and selective right lower extremity angiogram             4.  Percutaneous transluminal angioplasty of right anterior tibial artery with 4 mm diameter by 15 cm length Lutonix drug-coated angioplasty balloon             5.  StarClose closure device left femoral artery  EBL: 5 cc  Contrast: 35 cc  Fluoro Time: 4 minutes  Moderate Conscious Sedation Time: approximately 30 minutes using 2 mg of Versed and 50 mcg of Fentanyl              Indications:  Patient is a 67 y.o.male with a long history of peripheral arterial disease having already lost the left leg with an infection and nonhealing ulceration on the right foot. The patient is brought in for angiography for further evaluation and potential treatment.  Due to the limb threatening nature of the situation, angiogram was performed for attempted limb salvage. The patient is aware that if the procedure fails, amputation would be expected.  The patient also understands that even with successful revascularization, amputation may still be required due to the severity of the situation.  Risks and benefits are discussed and informed consent is obtained.   Procedure:  The patient was identified and appropriate procedural time out was performed.  The patient was then placed supine on the table and prepped and draped in the usual sterile fashion. Moderate conscious sedation was administered during a face to face encounter with the patient throughout the procedure with my  supervision of the RN administering medicines and monitoring the patient's vital signs, pulse oximetry, telemetry and mental status throughout from the start of the procedure until the patient was taken to the recovery room. Ultrasound was used to evaluate the left common femoral artery.  It was patent .  A digital ultrasound image was acquired.  A Seldinger needle was used to access the left common femoral artery under direct ultrasound guidance and a permanent image was performed.  A 0.035 J wire was advanced without resistance and a 5Fr sheath was placed.  Pigtail catheter was placed into the aorta proximal to the renal arteries and an AP aortogram was performed. This demonstrated normal renal arteries and normal aorta and iliac segments without significant stenosis. I then crossed the aortic bifurcation and advanced to the right femoral head.  Flow was somewhat sluggish likely due to diminished cardiac output, so the catheter was advanced into the proximal to mid SFA for the more distal images.  Selective right lower extremity angiogram was then performed. This demonstrated calcific but not stenotic common femoral artery, profunda femoris artery, and proximal superficial femoral artery.  The mid and distal superficial femoral artery were fairly normal.  There was mild disease in the above-knee popliteal artery just above the previously placed stent but this did not appear to be greater than 40%.  The stent in  the popliteal artery and the proximal portion of the anterior tibial artery were patent without significant stenosis.  The anterior tibial artery occluded about 5 to 7 cm beyond the stent and reconstituted a few centimeters distally.  The anterior tibial artery was the only continuous runoff distally although there was reconstitution of the peroneal and posterior tibial arteries distally through collaterals.. It was felt that it was in the patient's best interest to proceed with intervention after these  images to avoid a second procedure and a larger amount of contrast and fluoroscopy based off of the findings from the initial angiogram. The patient was systemically heparinized and a 6 French 70 cm sheath was then placed over the Terumo Advantage wire. I then used a Ferd Hibbs cross catheter and the 0.018 advantage wire to get into the anterior tibial artery and then crossed the occlusion in the anterior tibial artery and confirm intraluminal flow through the diagnostic catheter in the mid anterior tibial artery.  I then proceeded with treatment.  A 4 mm diameter by 15 cm length Lutonix drug-coated angioplasty balloon was inflated to 6 atm for 1 minute.  On completion imaging the anterior tibial artery is now continuous distally with only about a 20 to 25% residual stenosis after angioplasty.  The previously placed stent across the anterior tibial artery origin would exclude any intervention on the peroneal or posterior tibial arteries but there were collaterals that filled them distally.  I elected to terminate the procedure. The sheath was removed and StarClose closure device was deployed in the left femoral artery with excellent hemostatic result. The patient was taken to the recovery room in stable condition having tolerated the procedure well.  Findings:               Aortogram:  This demonstrated normal renal arteries and normal aorta and iliac segments without significant stenosis.             Right Lower Extremity:  This demonstrated calcific but not stenotic common femoral artery, profunda femoris artery, and proximal superficial femoral artery.  The mid and distal superficial femoral artery were fairly normal.  There was mild disease in the above-knee popliteal artery just above the previously placed stent but this did not appear to be greater than 40%.  The stent in the popliteal artery and the proximal portion of the anterior tibial artery were patent without significant stenosis.  The anterior tibial  artery occluded about 5 to 7 cm beyond the stent and reconstituted a few centimeters distally.  The anterior tibial artery was the only continuous runoff distally although there was reconstitution of the peroneal and posterior tibial arteries distally through collaterals.   Disposition: Patient was taken to the recovery room in stable condition having tolerated the procedure well.  Complications: None  [name] 05/21/2022 5:30 PM   This note was created with Dragon Medical transcription system. Any errors in dictation are purely unintentional.

## 2022-05-21 NOTE — H&P (View-Only) (Signed)
Hospital Consult    Reason for Consult:  Right Lower extremity Ulcer Requesting Physician:  Dr Dwaine Gale Marry Guan MD  MRN #:  [id number]  History of Present Illness: This is a 67 y.o. male with a history of COPD, chronic respiratory failure on home O2, CAD status post CABG, insulin-dependent type 2 diabetes, OSA on BiPAP at night, BPH hypertension, hypothyroidism and paroxysmal atrial fibrillation on Eliquis and amiodarone who has peripheral arterial disease status post left BKA and is admitted to the hospital with concern for right ankle osteomyelitis after an injury in December 2023.  He was seen in consultation by podiatry, who recommends continuation of empiric IV antibiotics, awaiting vascular surgery intervention likely 1/22.   Patient resting comfortably in bed this morning.  No complaints overnight.  Vital signs all remained stable.  Pictures taken of the right lower extremity wound placed in media.  Past Medical History:  Diagnosis Date   Arrhythmia    atrial fibrillation   Asthma    CHF (congestive heart failure) (HCC)    COPD (chronic obstructive pulmonary disease) (HCC)    Coronary artery disease    Depression    Diabetes mellitus without complication (HCC)    Gout    History anabolic steroid use    Hyperlipidemia    Hypertension    Hypogonadism in male    MI (myocardial infarction) ([place])    Morbid obesity ([place])    Myocardial infarction ([place])    Peripheral vascular disease ([place])    Perirectal abscess    Pleurisy    Sleep apnea    CPAP at night, no oxygen   Varicella     Past Surgical History:  Procedure Laterality Date   ABDOMINAL AORTIC ANEURYSM REPAIR     ACHILLES TENDON SURGERY Left 01/10/2021   Procedure: ACHILLES LENGTHENING/KIDNER;  Surgeon: Caroline More, DPM;  Location: [facility];  Service: Podiatry;  Laterality: Left;   AMPUTATION Left 10/03/2021   Procedure: AMPUTATION BELOW KNEE;  Surgeon: Katha Cabal, MD;  Location: [facility];  Service:  Vascular;  Laterality: Left;   AMPUTATION TOE Right 02/10/2016   Procedure: AMPUTATION TOE 3RD TOE;  Surgeon: Samara Deist, DPM;  Location: [facility];  Service: Podiatry;  Laterality: Right;   AMPUTATION TOE Left 02/24/2020   Procedure: AMPUTATION TOE;  Surgeon: Caroline More, DPM;  Location: [facility];  Service: Podiatry;  Laterality: Left;   APPLICATION OF WOUND VAC Left 02/29/2020   Procedure: APPLICATION OF WOUND VAC;  Surgeon: Caroline More, DPM;  Location: [facility];  Service: Podiatry;  Laterality: Left;   CARDIAC CATHETERIZATION     CARDIOVERSION [name] 02/13/2022   Procedure: CARDIOVERSION;  Surgeon: Corey Skains, MD;  Location: [facility];  Service: Cardiovascular;  Laterality: [name];   COLONOSCOPY WITH PROPOFOL [name] 11/18/2015   Procedure: COLONOSCOPY WITH PROPOFOL;  Surgeon: Manya Silvas, MD;  Location: [facility];  Service: Endoscopy;  Laterality: [name];   CORONARY ARTERY BYPASS GRAFT     CORONARY STENT INTERVENTION [name] 02/02/2020   Procedure: CORONARY STENT INTERVENTION;  Surgeon: Isaias Cowman, MD;  Location: [place] [facility];  Service: Cardiovascular;  Laterality: [name];   INCISION AND DRAINAGE Left 08/07/2021   Procedure: INCISION AND DRAINAGE-Partial Calcanectomy;  Surgeon: Caroline More, DPM;  Location: [facility];  Service: Podiatry;  Laterality: Left;   IRRIGATION AND DEBRIDEMENT FOOT Left 02/29/2020   Procedure: IRRIGATION AND DEBRIDEMENT FOOT;  Surgeon: Caroline More, DPM;  Location: [facility];  Service: Podiatry;  Laterality: Left;   IRRIGATION AND DEBRIDEMENT  FOOT Left 02/24/2020   Procedure: IRRIGATION AND DEBRIDEMENT FOOT;  Surgeon: Caroline More, DPM;  Location: [facility];  Service: Podiatry;  Laterality: Left;   KNEE ARTHROSCOPY     LEFT HEART CATH AND CORS/GRAFTS ANGIOGRAPHY [name] 02/02/2020   Procedure: LEFT HEART CATH AND CORS/GRAFTS ANGIOGRAPHY;  Surgeon: Teodoro Spray, MD;  Location: [place] [facility];  Service: Cardiovascular;  Laterality: [name];    LOWER EXTREMITY ANGIOGRAPHY Left 02/25/2020   Procedure: Lower Extremity Angiography;  Surgeon: Algernon Huxley, MD;  Location: [place] [facility];  Service: Cardiovascular;  Laterality: Left;   LOWER EXTREMITY ANGIOGRAPHY Left 01/04/2021   Procedure: LOWER EXTREMITY ANGIOGRAPHY;  Surgeon: Algernon Huxley, MD;  Location: [place] [facility];  Service: Cardiovascular;  Laterality: Left;   METATARSAL HEAD EXCISION Left 01/10/2021   Procedure: METATARSAL HEAD EXCISION - LEFT 5th;  Surgeon: Caroline More, DPM;  Location: [facility];  Service: Podiatry;  Laterality: Left;   PERIPHERAL VASCULAR CATHETERIZATION Right 01/24/2016   Procedure: Lower Extremity Angiography;  Surgeon: Katha Cabal, MD;  Location: [place] [facility];  Service: Cardiovascular;  Laterality: Right;   PERIPHERAL VASCULAR CATHETERIZATION Right 01/25/2016   Procedure: Lower Extremity Angiography;  Surgeon: Katha Cabal, MD;  Location: [place] [facility];  Service: Cardiovascular;  Laterality: Right;   TOE AMPUTATION     TONSILLECTOMY      Allergies  Allergen Reactions   Penicillins Other (See Comments)    Happened at 67 years old and pt. stated he passed out  He has tolerated amoxicillin/clavulanate and ampicillin/sulbactam   Statins     Other reaction(s): Muscle Pain Causes legs to ache per pt   Metformin And Related Diarrhea    Prior to Admission medications   Medication Sig Start Date End Date Taking? Authorizing Provider  acetaminophen (TYLENOL) 325 MG tablet Take 2 tablets (650 mg total) by mouth every 6 (six) hours as needed for mild pain (or Fever >/= 101). Patient taking differently: Take 650 mg by mouth every 4 (four) hours as needed for mild pain (or Fever >/= 101). 11/10/21   Emeterio Reeve, DO  amiodarone (PACERONE) 200 MG tablet Take 200 mg by mouth daily.    [provider]  apixaban (ELIQUIS) 5 MG TABS tablet Take 5 mg by mouth 2 (two) times daily.    [provider]  aspirin EC  81 MG tablet Take 81 mg by mouth daily. Swallow whole.    [provider]  budesonide (PULMICORT) 0.5 MG/2ML nebulizer solution Take 2 mLs (0.5 mg total) by nebulization 2 (two) times daily. 11/10/21   Emeterio Reeve, DO  Cholecalciferol 25 MCG (1000 UT) tablet Take 1,000 Units by mouth daily.    [provider]  Cyanocobalamin (VITAMIN B-12) 5000 MCG TBDP Take 10,000 mcg by mouth daily.    [provider]  diltiazem (TIAZAC) 300 MG 24 hr capsule Take 300 mg by mouth daily.    [provider]  ezetimibe (ZETIA) 10 MG tablet Take 10 mg by mouth at bedtime.    [provider]  ferrous sulfate 325 (65 FE) MG tablet Take 325 mg by mouth daily with breakfast.    [provider]  finasteride (PROSCAR) 5 MG tablet Take 1 tablet (5 mg total) by mouth daily. 11/11/21   Emeterio Reeve, DO  furosemide (LASIX) 20 MG tablet Take 2 tablets (40 mg total) by mouth daily. 02/15/22   Fritzi Mandes, MD  gabapentin (NEURONTIN) 300 MG capsule Take 300 mg  by mouth at bedtime.    [provider]  HUMALOG 100 UNIT/ML injection Inject 15 Units into the skin 3 (three) times daily with meals. >200 02/05/22   [provider]  insulin glargine-yfgn (SEMGLEE) 100 UNIT/ML injection Inject 0.25 mLs (25 Units total) into the skin at bedtime. Patient taking differently: Inject 40 Units into the skin every morning. 11/10/21   Emeterio Reeve, DO  isosorbide mononitrate (IMDUR) 60 MG 24 hr tablet Take 60 mg by mouth in the morning and at bedtime.    [provider]  levothyroxine (SYNTHROID) 25 MCG tablet Take 1 tablet (25 mcg total) by mouth daily at 6 (six) AM. 11/11/21   Emeterio Reeve, DO  losartan (COZAAR) 50 MG tablet Take 50 mg by mouth daily.    [provider]  Multiple Vitamins-Minerals (MULTIVITAMIN WITH MINERALS) tablet Take 1 tablet by mouth daily.    [provider]  nitrofurantoin, macrocrystal-monohydrate,  (MACROBID) 100 MG capsule Take 100 mg by mouth daily.    [provider]  pramipexole (MIRAPEX) 1 MG tablet Take 2 mg by mouth at bedtime.    [provider]  primidone (MYSOLINE) 250 MG tablet Take 250 mg by mouth 2 (two) times daily. 01/23/20   [provider]  risperiDONE (RISPERDAL) 0.5 MG tablet Take 1 tablet (0.5 mg total) by mouth 2 (two) times daily. 11/10/21   Emeterio Reeve, DO  tamsulosin (FLOMAX) 0.4 MG CAPS capsule Take 0.4 mg by mouth daily.    [provider]  traMADol (ULTRAM) 50 MG tablet Take 1 tablet (50 mg total) by mouth daily as needed for moderate pain. 08/11/21   Fritzi Mandes, MD  vitamin C (ASCORBIC ACID) 500 MG tablet Take 500 mg by mouth daily.    [provider]    Social History   Socioeconomic History   Marital status: Married    Spouse name: Not on file   Number of children: Not on file   Years of education: Not on file   Highest education level: Not on file  Occupational History   Not on file  Tobacco Use   Smoking status: Former    Packs/day: 0.50    Years: 45.00    Total pack years: 22.50    Types: Cigarettes    Quit date: 04/05/2015    Years since quitting: 7.1   Smokeless tobacco: Never  Vaping Use   Vaping Use: Every day  Substance and Sexual Activity   Alcohol use: Not Currently    Alcohol/week: 3.0 standard drinks of alcohol    Types: 3 Glasses of wine per week   Drug use: No   Sexual activity: Not on file  Other Topics Concern   Not on file  Social History Narrative   Not on file   Social Determinants of Health   Financial Resource Strain: Not on file  Food Insecurity: No Food Insecurity (05/19/2022)   Hunger Vital Sign    Worried About Running Out of Food in the Last Year: Never true    Ran Out of Food in the Last Year: Never true  Transportation Needs: No Transportation Needs (05/19/2022)   PRAPARE - Hydrologist (Medical): No    Lack of Transportation  (Non-Medical): No  Recent Concern: Transportation Needs - Unmet Transportation Needs (03/21/2022)   PRAPARE - Hydrologist (Medical): Yes    Lack of Transportation (Non-Medical): Yes  Physical Activity: Not on file  Stress: Not  on file  Social Connections: Not on file  Intimate Partner Violence: Not At Risk (05/19/2022)   Humiliation, Afraid, Rape, and Kick questionnaire    Fear of Current or Ex-Partner: No    Emotionally Abused: No    Physically Abused: No    Sexually Abused: No     Family History  Problem Relation Age of Onset   Hypertension Father    Coronary artery disease Father    Alcohol abuse Father    Heart failure Brother     ROS: Otherwise negative unless mentioned in HPI  Physical Examination  Vitals:   05/20/22 1719 05/21/22 0039  BP: 119/64 127/69  Pulse: 89 92  Resp: 17 20  Temp: 99.5 F (37.5 C) 99.7 F (37.6 C)  SpO2: 95% 93%   Body mass index is 35.9 kg/m.  General:  WDWN in NAD Gait: Not observed HENT: WNL, normocephalic Pulmonary: normal non-labored breathing, without Rales, rhonchi,  wheezing Cardiac: irregular, without  Murmurs, rubs or gallops; without carotid bruits Abdomen: Positive bowel sounds throughout soft, NT/ND, no masses Skin: without rashes Vascular Exam/Pulses: Left BKA from 4 years ago, right lower extremity cool to touch with open sore on right lateral ankle. Extremities: with ischemic changes, without Gangrene , with cellulitis; with open wounds;  Musculoskeletal: no muscle wasting or atrophy  Neurologic: A&O X 3;  No focal weakness or paresthesias are detected; speech is fluent/normal Psychiatric:  The pt has Normal affect. Lymph:  Unremarkable  CBC    Component Value Date/Time   WBC 11.7 (H) 05/21/2022 0431   RBC 3.55 (L) 05/21/2022 0431   HGB 9.9 (L) 05/21/2022 0431   HGB 10.7 (L) 05/15/2022 1440   HCT 31.6 (L) 05/21/2022 0431   HCT 33.4 (L) 05/15/2022 1440   PLT 332 05/21/2022 0431    PLT 394 05/15/2022 1440   MCV 89.0 05/21/2022 0431   MCV 89 05/15/2022 1440   MCV 96 07/12/2011 0919   MCH 27.9 05/21/2022 0431   MCHC 31.3 05/21/2022 0431   RDW 14.0 05/21/2022 0431   RDW 12.5 05/15/2022 1440   RDW 15.2 (H) 07/12/2011 0919   LYMPHSABS 1.0 05/18/2022 1527   LYMPHSABS 1.2 07/12/2011 0919   MONOABS 0.6 05/18/2022 1527   MONOABS 0.8 (H) 07/12/2011 0919   EOSABS 0.2 05/18/2022 1527   EOSABS 0.4 07/12/2011 0919   BASOSABS 0.1 05/18/2022 1527   BASOSABS 0.1 07/12/2011 0919    BMET    Component Value Date/Time   NA 135 05/21/2022 0431   NA 141 07/12/2011 0919   K 4.3 05/21/2022 0431   K 4.4 07/12/2011 0919   CL 101 05/21/2022 0431   CL 102 07/12/2011 0919   CO2 25 05/21/2022 0431   CO2 29 07/12/2011 0919   GLUCOSE 167 (H) 05/21/2022 0431   GLUCOSE 76 07/12/2011 0919   BUN 33 (H) 05/21/2022 0431   BUN 16 05/05/2013 1022   CREATININE 0.99 05/21/2022 0431   CREATININE 0.71 05/05/2013 1022   CALCIUM 9.1 05/21/2022 0431   CALCIUM 8.8 07/12/2011 0919   GFRNONAA >60 05/21/2022 0431   GFRNONAA >60 05/05/2013 1022   GFRAA >60 01/06/2020 1853   GFRAA >60 05/05/2013 1022    COAGS: Lab Results  Component Value Date   INR 1.3 (H) 05/18/2022   INR 1.4 (H) 03/16/2022   INR 1.6 (H) 03/15/2022     Non-Invasive Vascular Imaging:   none  Statin:  Yes.   Beta Blocker:  Yes.   Aspirin:  Yes.  ACEI:  No. ARB:  No. CCB use:  Yes Other antiplatelets/anticoagulants:  Yes.   Eliquis   ASSESSMENT/PLAN: This is a 67 y.o. male who was scheduled with Dr. Ella Jubilee on 05/29/2022 for right lower extremity angiogram.  Presented to the hospital with an open wound and requested to be seen sooner than his scheduled angiogram.  Pictures were taken and placed in the media section of his chart.  Patient's right lower extremity is cool to the touch.  I cannot find palpable posttibial pulses.  Plan: Patient will be taken to the vascular lab later this afternoon for right lower  extremity angiogram with Dr. [name].  We discussed the procedure, benefits, risks, and the complications.  Answered all the patient's questions this morning.  He verbalizes understanding of the procedure.  He would like to proceed as soon as possible.  Patient was made n.p.o. earlier this morning.  Patient's potassium this morning is 4.3, blood urea nitrogen is 33 and his creatinine is 0.99    Drema Pry [facility] 05/21/2022 8:23 AM

## 2022-05-21 NOTE — Interval H&P Note (Signed)
History and Physical Interval Note:  05/21/2022 4:32 PM  Alec A Harper Jr.  has presented today for surgery, with the diagnosis of PAD.  The various methods of treatment have been discussed with the patient and family. After consideration of risks, benefits and other options for treatment, the patient has consented to  Procedure(s): Lower Extremity Angiography (Right) as a surgical intervention.  The patient's history has been reviewed, patient examined, no change in status, stable for surgery.  I have reviewed the patient's chart and labs.  Questions were answered to the patient's satisfaction.     [name]

## 2022-05-21 NOTE — Care Management Important Message (Signed)
Important Message  Patient Details  Name: [name]. MRN: [id number] Date of Birth: 14-Jan-1956   Medicare Important Message Given:  [name] - LOS <3 / Initial given by admissions     Juliann Pulse A [name] 05/21/2022, 10:44 AM

## 2022-05-22 ENCOUNTER — Encounter: Payer: Self-pay | Admitting: Vascular Surgery

## 2022-05-22 DIAGNOSIS — S81809A Unspecified open wound, unspecified lower leg, initial encounter: Secondary | ICD-10-CM | POA: Diagnosis not present

## 2022-05-22 LAB — GLUCOSE, CAPILLARY
Glucose-Capillary: 185 mg/dL — ABNORMAL HIGH (ref 70–99)
Glucose-Capillary: 128 mg/dL — ABNORMAL HIGH (ref 70–99)
Glucose-Capillary: 217 mg/dL — ABNORMAL HIGH (ref 70–99)
Glucose-Capillary: 208 mg/dL — ABNORMAL HIGH (ref 70–99)
Glucose-Capillary: 185 mg/dL — ABNORMAL HIGH (ref 70–99)
Glucose-Capillary: 158 mg/dL — ABNORMAL HIGH (ref 70–99)

## 2022-05-22 LAB — BASIC METABOLIC PANEL
Anion gap: 6 (ref 5–15)
BUN: 33 mg/dL — ABNORMAL HIGH (ref 8–23)
CO2: 27 mmol/L (ref 22–32)
Calcium: 8.8 mg/dL — ABNORMAL LOW (ref 8.9–10.3)
Chloride: 100 mmol/L (ref 98–111)
Creatinine, Ser: 0.93 mg/dL (ref 0.61–1.24)
GFR, Estimated: >60 mL/min (ref >60)
Glucose, Bld: 185 mg/dL — ABNORMAL HIGH (ref 70–99)
Potassium: 4.1 mmol/L (ref 3.5–5.1)
Sodium: 133 mmol/L — ABNORMAL LOW (ref 135–145)

## 2022-05-22 LAB — HEMOGLOBIN A1C
Hgb A1c MFr Bld: 11.2 % — ABNORMAL HIGH (ref 4.8–5.6)
Mean Plasma Glucose: 274.74 mg/dL

## 2022-05-22 LAB — CBC
HCT: 29.6 % — ABNORMAL LOW (ref 39.0–52.0)
Hemoglobin: 9.2 g/dL — ABNORMAL LOW (ref 13.0–17.0)
MCH: 27.9 pg (ref 26.0–34.0)
MCHC: 31.1 g/dL (ref 30.0–36.0)
MCV: 89.7 fL (ref 80.0–100.0)
Platelets: 305 10*3/uL (ref 150–400)
RBC: 3.3 MIL/uL — ABNORMAL LOW (ref 4.22–5.81)
RDW: 14.2 % (ref 11.5–15.5)
WBC: 11.3 10*3/uL — ABNORMAL HIGH (ref 4.0–10.5)
nRBC: 0 % (ref 0.0–0.2)

## 2022-05-22 MED ORDER — INSULIN ASPART 100 UNIT/ML IJ SOLN
0.0000 [IU] | Freq: Every day | INTRAMUSCULAR | Status: DC
  Administered 2022-05-23: 5 [IU] via SUBCUTANEOUS
  Administered 2022-05-24: 4 [IU] via SUBCUTANEOUS
  Administered 2022-05-25: 2 [IU] via SUBCUTANEOUS
  Filled 2022-05-22 (×3): qty 1

## 2022-05-22 MED ORDER — PRAMIPEXOLE DIHYDROCHLORIDE 1 MG PO TABS
2.0000 mg | ORAL_TABLET | Freq: Every day | ORAL | Status: DC
  Administered 2022-05-22 – 2022-06-05 (×15): 2 mg via ORAL
  Filled 2022-05-22 (×16): qty 2

## 2022-05-22 MED ORDER — PRIMIDONE 250 MG PO TABS
250.0000 mg | ORAL_TABLET | Freq: Two times a day (BID) | ORAL | Status: DC
  Administered 2022-05-22 – 2022-06-06 (×30): 250 mg via ORAL
  Filled 2022-05-22 (×32): qty 1

## 2022-05-22 MED ORDER — LEVOTHYROXINE SODIUM 25 MCG PO TABS
25.0000 ug | ORAL_TABLET | Freq: Every day | ORAL | Status: DC
  Administered 2022-05-23 – 2022-06-06 (×14): 25 ug via ORAL
  Filled 2022-05-22 (×14): qty 1

## 2022-05-22 MED ORDER — INSULIN ASPART 100 UNIT/ML IJ SOLN
3.0000 [IU] | Freq: Three times a day (TID) | INTRAMUSCULAR | Status: DC
  Administered 2022-05-22 – 2022-05-26 (×9): 3 [IU] via SUBCUTANEOUS
  Filled 2022-05-22 (×10): qty 1

## 2022-05-22 MED ORDER — GABAPENTIN 300 MG PO CAPS
300.0000 mg | ORAL_CAPSULE | Freq: Every day | ORAL | Status: DC
  Administered 2022-05-22 – 2022-06-05 (×15): 300 mg via ORAL
  Filled 2022-05-22 (×15): qty 1

## 2022-05-22 MED ORDER — INSULIN ASPART 100 UNIT/ML IJ SOLN
0.0000 [IU] | Freq: Three times a day (TID) | INTRAMUSCULAR | Status: DC
  Administered 2022-05-22: 3 [IU] via SUBCUTANEOUS
  Administered 2022-05-23: 8 [IU] via SUBCUTANEOUS
  Administered 2022-05-23: 5 [IU] via SUBCUTANEOUS
  Administered 2022-05-23: 3 [IU] via SUBCUTANEOUS
  Administered 2022-05-24 (×2): 5 [IU] via SUBCUTANEOUS
  Administered 2022-05-24: 3 [IU] via SUBCUTANEOUS
  Administered 2022-05-25 (×2): 11 [IU] via SUBCUTANEOUS
  Administered 2022-05-25 – 2022-05-26 (×2): 5 [IU] via SUBCUTANEOUS
  Administered 2022-05-26: 15 [IU] via SUBCUTANEOUS
  Administered 2022-05-27: 5 [IU] via SUBCUTANEOUS
  Filled 2022-05-22 (×12): qty 1

## 2022-05-22 MED ORDER — EZETIMIBE 10 MG PO TABS
10.0000 mg | ORAL_TABLET | Freq: Every day | ORAL | Status: DC
  Administered 2022-05-22 – 2022-06-06 (×15): 10 mg via ORAL
  Filled 2022-05-22 (×16): qty 1

## 2022-05-22 NOTE — Progress Notes (Signed)
Progress Note    05/22/2022 8:05 AM 1 Day Post-Op  Subjective:  [name] is S/P POD #1   Aortogram and selective right lower extremity angiogram. He has a HX of  COPD, chronic respiratory failure on home O2, CAD status post CABG, insulin-dependent type 2 diabetes, OSA on BiPAP at night, BPH hypertension, hypothyroidism and paroxysmal atrial fibrillation on Eliquis and amiodarone who has peripheral arterial disease status post left BKA and is admitted to the hospital with concern for right ankle osteomyelitis after an injury in December 2023. He was seen in consultation by podiatry, who recommends continuation of empiric IV antibiotics   Seen this morning. No complaints overnight. Vitals all remain stable. Patient states his right leg feels much warmer today.    Vitals:   05/22/22 0744 05/22/22 0803  BP:  114/60  Pulse: 77 81  Resp: 17 18  Temp:  97.9 F (36.6 C)  SpO2: 97% 98%   Physical Exam: Cardiac:  Irregular hx of Afib Lungs:  Clear throughout  Incisions:  Left Groin, Dressing C/D/I no hematoma or seroma.  Extremities: Hx Left BKA, Right Lower extremity warm to touch with palpable Post tibial and Dorsalis Pedis pulse.  Abdomen:  Positive Bowel Sounds, Soft NT/ND  Neurologic: AAOX3  CBC    Component Value Date/Time   WBC 11.3 (H) 05/22/2022 0442   RBC 3.30 (L) 05/22/2022 0442   HGB 9.2 (L) 05/22/2022 0442   HGB 10.7 (L) 05/15/2022 1440   HCT 29.6 (L) 05/22/2022 0442   HCT 33.4 (L) 05/15/2022 1440   PLT 305 05/22/2022 0442   PLT 394 05/15/2022 1440   MCV 89.7 05/22/2022 0442   MCV 89 05/15/2022 1440   MCV 96 07/12/2011 0919   MCH 27.9 05/22/2022 0442   MCHC 31.1 05/22/2022 0442   RDW 14.2 05/22/2022 0442   RDW 12.5 05/15/2022 1440   RDW 15.2 (H) 07/12/2011 0919   LYMPHSABS 1.0 05/18/2022 1527   LYMPHSABS 1.2 07/12/2011 0919   MONOABS 0.6 05/18/2022 1527   MONOABS 0.8 (H) 07/12/2011 0919   EOSABS 0.2 05/18/2022 1527   EOSABS 0.4 07/12/2011 0919    BASOSABS 0.1 05/18/2022 1527   BASOSABS 0.1 07/12/2011 0919    BMET    Component Value Date/Time   NA 133 (L) 05/22/2022 0442   NA 141 07/12/2011 0919   K 4.1 05/22/2022 0442   K 4.4 07/12/2011 0919   CL 100 05/22/2022 0442   CL 102 07/12/2011 0919   CO2 27 05/22/2022 0442   CO2 29 07/12/2011 0919   GLUCOSE 185 (H) 05/22/2022 0442   GLUCOSE 76 07/12/2011 0919   BUN 33 (H) 05/22/2022 0442   BUN 16 05/05/2013 1022   CREATININE 0.93 05/22/2022 0442   CREATININE 0.71 05/05/2013 1022   CALCIUM 8.8 (L) 05/22/2022 0442   CALCIUM 8.8 07/12/2011 0919   GFRNONAA >60 05/22/2022 0442   GFRNONAA >60 05/05/2013 1022   GFRAA >60 01/06/2020 1853   GFRAA >60 05/05/2013 1022    INR    Component Value Date/Time   INR 1.3 (H) 05/18/2022 1527     Intake/Output Summary (Last 24 hours) at 05/22/2022 0805 Last data filed at 05/22/2022 0758 Gross per 24 hour  Intake 1644.93 ml  Output 3050 ml  Net -1405.07 ml     Assessment/Plan:  67 y.o. male is s/p Aortogram and selective right lower extremity angiogram. 1 Day Post-Op   On exam right lower extremity Warm this morning. Palpable pulses PT/DP  PLAN:  Restart  Eliquis Today Wound care to be managed by Podiatry. If Podiatry feels foot is unsalvageable we can discuss Amputation with patient. Let us know.  No other interventions planned at this time. Okay to discharge home by Vascular if Primary team and Podiatry feel the same way.      DVT prophylaxis:  Lovenox   Drema Pry [facility] 05/22/2022 8:05 AM

## 2022-05-22 NOTE — Inpatient Diabetes Management (Signed)
Inpatient Diabetes Program Recommendations  AACE/ADA: New Consensus Statement on Inpatient Glycemic Control (2015)  Target Ranges:  Prepandial:   less than 140 mg/dL      Peak postprandial:   less than 180 mg/dL (1-2 hours)      Critically ill patients:  140 - 180 mg/dL   Lab Results  Component Value Date   GLUCAP 217 (H) 05/22/2022   HGBA1C 8.9 (H) 03/15/2022    Latest Reference Range & Units 05/21/22 16:03 05/21/22 21:18 05/21/22 23:21 05/21/22 23:54 05/22/22 04:26 05/22/22 07:48 05/22/22 11:40  Glucose-Capillary 70 - 99 mg/dL 112 (H) 256 (H) 289 (H) 251 (H) 185 (H) 128 (H) 217 (H)  (H): Data is abnormally high  Diabetes history: DM2 Outpatient Diabetes medications:  Semglee 40 QAM Novolog 15 units TID if >200 mg/dL Current orders for Inpatient glycemic control:  Semglee 25 units QHS Novolog 0-20 units Q4H  Inpatient Diabetes Program Recommendations:     Has a diet order.  Please consider:   Novolog 0-15 units TID and 0-5 units QHS Novolog 3 units TID with meals if he consumes at least 50%   Will continue to follow while inpatient.  Thank you, Nani Gasser. Nainoa Woldt, RN, MSN, CDE  Diabetes Coordinator Inpatient Glycemic Control Team Team Pager [phone] (8am-5pm) 05/22/2022 1:51 PM

## 2022-05-22 NOTE — TOC Progression Note (Signed)
Transition of Care (TOC) - Progression Note    Patient Details  Name: [name]. MRN: [id number] Date of Birth: 10-Sep-1955  Transition of Care [facility]) CM/SW [place], RN Phone Number: 05/22/2022, 3:56 PM  Clinical Narrative:     TOC continues to follow the patient for needs and DC planning Currently will Continue empiric cefepime and vancomycin for now, and await further recommendations from podiatry and vascular.    Expected Discharge Plan: Home/Self Care Barriers to Discharge: Continued Medical Work up  Expected Discharge Plan and Services   Discharge Planning Services: CM Consult   Living arrangements for the past 2 months: [place]: Well [place] Determinants of Health (SDOH) Interventions SDOH Screenings   Food Insecurity: No Food Insecurity (05/19/2022)  Housing: Low Risk  (05/19/2022)  Transportation Needs: No Transportation Needs (05/19/2022)  Recent Concern: Transportation Needs - Unmet Transportation Needs (03/21/2022)  Utilities: Not At Risk (05/19/2022)  Depression (PHQ2-9): Low Risk  (11/21/2021)  Tobacco Use: Medium Risk (05/22/2022)    Readmission Risk Interventions    02/13/2022    4:57 PM 09/29/2021   11:12 AM  Readmission Risk Prevention Plan  Transportation Screening Complete Complete  PCP or Specialist Appt within 3-5 Days  Complete  HRI or [place]  Complete  Social Work Consult for [place] Planning/Counseling  Complete  Palliative Care Screening  Not Applicable  Medication Review Press photographer) Complete Complete  PCP or Specialist appointment within 3-5 days of discharge Complete   HRI or [place] Complete   SW Recovery Care/Counseling Consult Complete   [place] Not Applicable

## 2022-05-22 NOTE — Progress Notes (Signed)
PROGRESS NOTE  [name]. ZGY:[id number] DOB: 08/17/1955 DOA: 05/18/2022 PCP: [name], MD  Hospital Course/Subjective: This is a pleasant 67 year old gentleman with a history of COPD, chronic respiratory failure on home O2, CAD status post CABG, insulin-dependent type 2 diabetes, OSA on BiPAP at night, BPH hypertension, hypothyroidism and paroxysmal atrial fibrillation on Eliquis and amiodarone who has peripheral arterial disease status post left BKA and is admitted to the hospital with concern for right ankle osteomyelitis after an injury in December 2023.  He was seen in consultation by podiatry, who recommends continuation of empiric IV antibiotics, underwent angiogram and angioplasty of the right anterior tibial artery with vascular surgery on 1/22.  Seen this morning visiting with his neighbor.  He feels well this morning, has no complaints.  Assessment/Plan: Acute osteomyelitis of right foot (HCC) Nonhealing wound right ankle PAD s/p left BKA Sent in by podiatry with concern for osteomyelitis, seen by Dr. Caryl Comes 1/20 and I personally discussed with him as well. Angiogram with angioplasty 1/22 with vascular surgery Continue empiric cefepime and vancomycin for now, and await further recommendations from podiatry and vascular.  Hyperkalemia Received a dose of Lokelma in the ED for potassium of 5.6, this is now resolved  AKI (acute kidney injury) ([place]) Creatinine 1.3 at admission, now improving IV hydration and monitor.  Avoid nephrotoxins and renally dose all meds  Uncontrolled type 2 diabetes mellitus with hypoglycemia, with long-term current use of insulin (HCC) Blood sugar in the 300s on admission initially Continue home basal insulin Sliding scale coverage  AF (paroxysmal atrial fibrillation) (HCC) Chronic anticoagulation Continue amiodarone Will hold apixaban for now, resume when okay by vascular  COPD (chronic obstructive pulmonary disease) (HCC) OSA  on home BiPAP Chronic respiratory failure on home O2 Patient is at baseline Continue home Pulmicort and inhalers Continue BiPAP and O2 at night and anytime he is sleeping  CAD with Hx of CABG No complaints of chest pain and EKG nonacute Continue aspirin, ezetimibe, Imdur, losartan  Chronic diastolic CHF (congestive heart failure) (HCC) Clinically euvolemic Continue Lasix, losartan  Essential hypertension BP controlled.  Continue home antihypertensives  BPH (benign prostatic hyperplasia) Continue finasteride and tamsulosin  DVT Prophylaxis: Lovenox subcu daily, while Eliquis is being held  Code Status: Full code  Family Communication: Discussed with patient and neighbor this morning at the bedside.  Disposition Plan: Pending the above  Consultants: Vascular surgery Podiatry  Antimicrobials: Anti-infectives (From admission, onward)    Start     Dose/Rate Route Frequency Ordered Stop   05/21/22 1655  ciprofloxacin (CIPRO) IVPB  Status:  Discontinued        over 60 Minutes  Continuous PRN 05/21/22 1655 05/21/22 1737   05/21/22 1515  ciprofloxacin (CIPRO) IVPB 400 mg  Status:  Discontinued        400 mg 200 mL/hr over 60 Minutes Intravenous 60 min pre-op 05/21/22 1515 05/21/22 1736   05/19/22 1200  vancomycin (VANCOREADY) IVPB 1250 mg/250 mL        1,250 mg 166.7 mL/hr over 90 Minutes Intravenous Every 12 hours 05/19/22 0739     05/19/22 0100  vancomycin (VANCOREADY) IVPB 500 mg/100 mL       See Hyperspace for full Linked Orders Report.   500 mg 100 mL/hr over 60 Minutes Intravenous  Once 05/18/22 2209 05/19/22 0241   05/18/22 2300  vancomycin (VANCOREADY) IVPB 2000 mg/400 mL       See Hyperspace for full Linked Orders Report.   2,000 mg  200 mL/hr over 120 Minutes Intravenous  Once 05/18/22 2209 05/19/22 0138   05/18/22 2300  ceFEPIme (MAXIPIME) 2 g in sodium chloride 0.9 % 100 mL IVPB        2 g 200 mL/hr over 30 Minutes Intravenous Every 8 hours 05/18/22 2209      05/18/22 2210  vancomycin variable dose per unstable renal function (pharmacist dosing)  Status:  Discontinued         Does not apply See admin instructions 05/18/22 2211 05/20/22 0908   05/18/22 2145  metroNIDAZOLE (FLAGYL) tablet 500 mg        500 mg Oral Every 12 hours 05/18/22 2116 05/25/22 2144   05/18/22 2000  metroNIDAZOLE (FLAGYL) IVPB 500 mg  Status:  Discontinued        500 mg 100 mL/hr over 60 Minutes Intravenous  Once 05/18/22 1948 05/18/22 2144   05/18/22 2000  ceFEPIme (MAXIPIME) 2 g in sodium chloride 0.9 % 100 mL IVPB  Status:  Discontinued        2 g 200 mL/hr over 30 Minutes Intravenous  Once 05/18/22 1959 05/18/22 2209   05/18/22 2000  vancomycin (VANCOCIN) IVPB 1000 mg/200 mL premix  Status:  Discontinued        1,000 mg 200 mL/hr over 60 Minutes Intravenous  Once 05/18/22 1959 05/18/22 2209       Objective: Vitals:   05/21/22 2052 05/21/22 2318 05/22/22 0744 05/22/22 0803  BP:  106/67  114/60  Pulse:  98 77 81  Resp:  '18 17 18  '$ Temp:  98.2 F (36.8 C)  97.9 F (36.6 C)  TempSrc:      SpO2: 95% 97% 97% 98%  Weight:      Height:        Intake/Output Summary (Last 24 hours) at 05/22/2022 1103 Last data filed at 05/22/2022 0758 Gross per 24 hour  Intake 1644.93 ml  Output 2550 ml  Net -905.07 ml    Filed Weights   05/18/22 2150  Weight: 135.5 kg   Exam: General:  Alert, oriented, calm, in no acute distress  Eyes: EOMI, clear conjuctivae, white sclerea Neck: supple, no masses, trachea mildline  Cardiovascular: RRR, no murmurs or rubs, no peripheral edema  Respiratory: clear to auscultation bilaterally, no wheezes, no crackles  Abdomen: soft, nontender, nondistended, normal bowel tones heard  Skin: dry, no rashes  Musculoskeletal: no joint effusions, normal range of motion, status post left BKA Psychiatric: appropriate affect, normal speech  Neurologic: extraocular muscles intact, clear speech, moving all extremities with intact sensorium  Data  Reviewed: CBC: Recent Labs  Lab 05/18/22 1527 05/19/22 0531 05/20/22 0642 05/21/22 0431 05/22/22 0442  WBC 11.8* 12.2* 13.2* 11.7* 11.3*  NEUTROABS 9.9*  --   --   --   --   HGB 10.8* 9.3* 10.1* 9.9* 9.2*  HCT 35.3* 29.8* 31.9* 31.6* 29.6*  MCV 91.0 90.0 89.1 89.0 89.7  PLT 345 339 323 332 427    Basic Metabolic Panel: Recent Labs  Lab 05/18/22 1527 05/19/22 0531 05/20/22 0642 05/21/22 0431 05/22/22 0442  NA 134* 137 133* 135 133*  K 5.6* 4.6 4.7 4.3 4.1  CL 98 103 101 101 100  CO2 '28 28 25 25 27  '$ GLUCOSE 336* 144* 101* 167* 185*  BUN 31* 28* 28* 33* 33*  CREATININE 1.30* 1.07 0.90 0.99 0.93  CALCIUM 9.0 8.9 8.7* 9.1 8.8*    GFR: Estimated Creatinine Clearance: 115.9 mL/min (by C-G formula based on SCr of 0.93 mg/dL).  Liver Function Tests: Recent Labs  Lab 05/18/22 1527  AST 18  ALT 25  ALKPHOS 170*  BILITOT 0.3  PROT 7.4  ALBUMIN 2.6*    No results for input(s): "LIPASE", "AMYLASE" in the last 168 hours. No results for input(s): "AMMONIA" in the last 168 hours. Coagulation Profile: Recent Labs  Lab 05/18/22 1527  INR 1.3*    Cardiac Enzymes: No results for input(s): "CKTOTAL", "CKMB", "CKMBINDEX", "TROPONINI" in the last 168 hours. BNP (last 3 results) No results for input(s): "PROBNP" in the last 8760 hours. HbA1C: No results for input(s): "HGBA1C" in the last 72 hours. CBG: Recent Labs  Lab 05/21/22 2118 05/21/22 2321 05/21/22 2354 05/22/22 0426 05/22/22 0748  GLUCAP 256* 289* 251* 185* 128*    Lipid Profile: No results for input(s): "CHOL", "HDL", "LDLCALC", "TRIG", "CHOLHDL", "LDLDIRECT" in the last 72 hours. Thyroid Function Tests: No results for input(s): "TSH", "T4TOTAL", "FREET4", "T3FREE", "THYROIDAB" in the last 72 hours. Anemia Panel: No results for input(s): "VITAMINB12", "FOLATE", "FERRITIN", "TIBC", "IRON", "RETICCTPCT" in the last 72 hours. Urine analysis:    Component Value Date/Time   COLORURINE YELLOW (A)  05/20/2022 0134   APPEARANCEUR CLEAR (A) 05/20/2022 0134   LABSPEC 1.013 05/20/2022 0134   PHURINE 6.0 05/20/2022 0134   GLUCOSEU 50 (A) 05/20/2022 0134   HGBUR NEGATIVE 05/20/2022 0134   BILIRUBINUR NEGATIVE 05/20/2022 0134   KETONESUR NEGATIVE 05/20/2022 0134   PROTEINUR 100 (A) 05/20/2022 0134   NITRITE NEGATIVE 05/20/2022 0134   LEUKOCYTESUR NEGATIVE 05/20/2022 0134   Sepsis Labs: '@LABRCNTIP'$ (procalcitonin:4,lacticidven:4)  ) Recent Results (from the past 240 hour(s))  Culture, blood (Routine x 2)     Status: None (Preliminary result)   Collection Time: 05/18/22  3:27 PM   Specimen: BLOOD  Result Value Ref Range Status   Specimen Description BLOOD RIGHT [facility]  Final   Special Requests   Final    BOTTLES DRAWN AEROBIC AND ANAEROBIC Blood Culture adequate volume   Culture   Final    NO GROWTH 4 DAYS Performed at [facility] [address] [place], [place] [address]    Report Status PENDING  Incomplete  Culture, blood (Routine X 2) w Reflex to ID Panel     Status: None (Preliminary result)   Collection Time: 05/18/22 10:10 PM   Specimen: BLOOD  Result Value Ref Range Status   Specimen Description BLOOD BLOOD LEFT ARM  Final   Special Requests   Final    BOTTLES DRAWN AEROBIC AND ANAEROBIC Blood Culture adequate volume   Culture   Final    NO GROWTH 4 DAYS Performed at [facility], [address] [place], [place] [address]    Report Status PENDING  Incomplete     Studies: PERIPHERAL VASCULAR CATHETERIZATION  Result Date: 05/21/2022 See surgical note for result.   Scheduled Meds:  amiodarone  200 mg Oral Daily   ascorbic acid  500 mg Oral BID   aspirin EC  81 mg Oral Daily   budesonide  0.5 mg Nebulization BID   clopidogrel  75 mg Oral Daily   cyanocobalamin  5,000 mcg Oral Daily   enoxaparin (LOVENOX) injection  0.5 mg/kg Subcutaneous Q24H   ferrous sulfate  325 mg Oral Q breakfast   finasteride  5 mg Oral Daily   furosemide  40 mg Oral  Daily   insulin aspart  0-20 Units Subcutaneous Q4H   insulin glargine-yfgn  25 Units Subcutaneous QHS   isosorbide mononitrate  60 mg Oral Daily  losartan  50 mg Oral Daily   metroNIDAZOLE  500 mg Oral Q12H   multivitamin with minerals  1 tablet Oral Daily   nutrition supplement (JUVEN)  1 packet Oral BID BM   Ensure Max Protein  11 oz Oral QHS   risperiDONE  0.5 mg Oral BID   tamsulosin  0.4 mg Oral Daily   zinc sulfate  220 mg Oral Daily    Continuous Infusions:  ceFEPime (MAXIPIME) IV 2 g (05/22/22 0758)   vancomycin 166.7 mL/hr at 05/22/22 0428     LOS: 4 days   Time spent: 31 minutes  Rhiley Solem Marry Guan, MD [facility] Pager [phone]  If 7PM-7AM, please contact night-coverage [email or web address] Password [facility] 05/22/2022, 11:03 AM

## 2022-05-22 NOTE — Plan of Care (Signed)

## 2022-05-22 NOTE — Progress Notes (Signed)
Pharmacy Antibiotic Note  [name]. is a 67 y.o. male admitted on 05/18/2022 with suspected diabetic foot infection/osteomyelitis. Pharmacy has been consulted for vancomycin and cefepime dosing.  -s/p angiogram 1/22  Plan:  -Cefepime 2 g IV q8h  -vancomycin 1.25 g IV q12h --Calculated AUC: 507, Cmin 14.8 --Daily Scr per protocol --Levels at steady state or as clinically indicated    Height: '6\' 4"'$  (193 cm) Weight: 135.5 kg (298 lb 12.8 oz) IBW/kg (Calculated) : 86.8  Temp (24hrs), Avg:97.9 F (36.6 C), Min:97.7 F (36.5 C), Max:98.2 F (36.8 C)  Recent Labs  Lab 05/18/22 1527 05/18/22 2210 05/19/22 0531 05/20/22 0642 05/21/22 0431 05/22/22 0442  WBC 11.8*  --  12.2* 13.2* 11.7* 11.3*  CREATININE 1.30*  --  1.07 0.90 0.99 0.93  LATICACIDVEN 1.5 1.3  --   --   --   --      Estimated Creatinine Clearance: 115.9 mL/min (by C-G formula based on SCr of 0.93 mg/dL).    Allergies  Allergen Reactions   Penicillins Other (See Comments)    Happened at 67 years old and pt. stated he passed out  He has tolerated amoxicillin/clavulanate and ampicillin/sulbactam   Statins     Other reaction(s): Muscle Pain Causes legs to ache per pt   Metformin And Related Diarrhea    Antimicrobials this admission: Metronidazole 1/19 (evening)>> Vancomycin 1/19 (evening) >>  Cefepime 1/19 (evening)>>   Dose adjustments this admission: [name]  Microbiology results: 1/19 BCx: [id number]  Thank you for allowing pharmacy to be a part of this patient's care.  Niraj Kudrna A 05/22/2022 11:12 AM

## 2022-05-22 NOTE — H&P (View-Only) (Signed)
Progress Note    05/22/2022 8:05 AM 1 Day Post-Op  Subjective:  [name] is S/P POD #1   Aortogram and selective right lower extremity angiogram. He has a HX of  COPD, chronic respiratory failure on home O2, CAD status post CABG, insulin-dependent type 2 diabetes, OSA on BiPAP at night, BPH hypertension, hypothyroidism and paroxysmal atrial fibrillation on Eliquis and amiodarone who has peripheral arterial disease status post left BKA and is admitted to the hospital with concern for right ankle osteomyelitis after an injury in December 2023. He was seen in consultation by podiatry, who recommends continuation of empiric IV antibiotics   Seen this morning. No complaints overnight. Vitals all remain stable. Patient states his right leg feels much warmer today.    Vitals:   05/22/22 0744 05/22/22 0803  BP:  114/60  Pulse: 77 81  Resp: 17 18  Temp:  97.9 F (36.6 C)  SpO2: 97% 98%   Physical Exam: Cardiac:  Irregular hx of Afib Lungs:  Clear throughout  Incisions:  Left Groin, Dressing C/D/I no hematoma or seroma.  Extremities: Hx Left BKA, Right Lower extremity warm to touch with palpable Post tibial and Dorsalis Pedis pulse.  Abdomen:  Positive Bowel Sounds, Soft NT/ND  Neurologic: AAOX3  CBC    Component Value Date/Time   WBC 11.3 (H) 05/22/2022 0442   RBC 3.30 (L) 05/22/2022 0442   HGB 9.2 (L) 05/22/2022 0442   HGB 10.7 (L) 05/15/2022 1440   HCT 29.6 (L) 05/22/2022 0442   HCT 33.4 (L) 05/15/2022 1440   PLT 305 05/22/2022 0442   PLT 394 05/15/2022 1440   MCV 89.7 05/22/2022 0442   MCV 89 05/15/2022 1440   MCV 96 07/12/2011 0919   MCH 27.9 05/22/2022 0442   MCHC 31.1 05/22/2022 0442   RDW 14.2 05/22/2022 0442   RDW 12.5 05/15/2022 1440   RDW 15.2 (H) 07/12/2011 0919   LYMPHSABS 1.0 05/18/2022 1527   LYMPHSABS 1.2 07/12/2011 0919   MONOABS 0.6 05/18/2022 1527   MONOABS 0.8 (H) 07/12/2011 0919   EOSABS 0.2 05/18/2022 1527   EOSABS 0.4 07/12/2011 0919    BASOSABS 0.1 05/18/2022 1527   BASOSABS 0.1 07/12/2011 0919    BMET    Component Value Date/Time   NA 133 (L) 05/22/2022 0442   NA 141 07/12/2011 0919   K 4.1 05/22/2022 0442   K 4.4 07/12/2011 0919   CL 100 05/22/2022 0442   CL 102 07/12/2011 0919   CO2 27 05/22/2022 0442   CO2 29 07/12/2011 0919   GLUCOSE 185 (H) 05/22/2022 0442   GLUCOSE 76 07/12/2011 0919   BUN 33 (H) 05/22/2022 0442   BUN 16 05/05/2013 1022   CREATININE 0.93 05/22/2022 0442   CREATININE 0.71 05/05/2013 1022   CALCIUM 8.8 (L) 05/22/2022 0442   CALCIUM 8.8 07/12/2011 0919   GFRNONAA >60 05/22/2022 0442   GFRNONAA >60 05/05/2013 1022   GFRAA >60 01/06/2020 1853   GFRAA >60 05/05/2013 1022    INR    Component Value Date/Time   INR 1.3 (H) 05/18/2022 1527     Intake/Output Summary (Last 24 hours) at 05/22/2022 0805 Last data filed at 05/22/2022 0758 Gross per 24 hour  Intake 1644.93 ml  Output 3050 ml  Net -1405.07 ml     Assessment/Plan:  67 y.o. male is s/p Aortogram and selective right lower extremity angiogram. 1 Day Post-Op   On exam right lower extremity Warm this morning. Palpable pulses PT/DP  PLAN:  Restart  Eliquis Today Wound care to be managed by Podiatry. If Podiatry feels foot is unsalvageable we can discuss Amputation with patient. Let us know.  No other interventions planned at this time. Okay to discharge home by Vascular if Primary team and Podiatry feel the same way.      DVT prophylaxis:  Lovenox   Drema Pry [facility] 05/22/2022 8:05 AM

## 2022-05-23 DIAGNOSIS — S81809A Unspecified open wound, unspecified lower leg, initial encounter: Secondary | ICD-10-CM | POA: Diagnosis not present

## 2022-05-23 LAB — CULTURE, BLOOD (ROUTINE X 2)
Culture: NO GROWTH
Culture: NO GROWTH
Special Requests: ADEQUATE
Special Requests: ADEQUATE

## 2022-05-23 LAB — BASIC METABOLIC PANEL
Anion gap: 7 (ref 5–15)
BUN: 39 mg/dL — ABNORMAL HIGH (ref 8–23)
CO2: 24 mmol/L (ref 22–32)
Calcium: 9 mg/dL (ref 8.9–10.3)
Chloride: 104 mmol/L (ref 98–111)
Creatinine, Ser: 1.03 mg/dL (ref 0.61–1.24)
GFR, Estimated: >60 mL/min (ref >60)
Glucose, Bld: 152 mg/dL — ABNORMAL HIGH (ref 70–99)
Potassium: 3.8 mmol/L (ref 3.5–5.1)
Sodium: 135 mmol/L (ref 135–145)

## 2022-05-23 LAB — CBC
HCT: 32.6 % — ABNORMAL LOW (ref 39.0–52.0)
Hemoglobin: 10.1 g/dL — ABNORMAL LOW (ref 13.0–17.0)
MCH: 28.1 pg (ref 26.0–34.0)
MCHC: 31 g/dL (ref 30.0–36.0)
MCV: 90.6 fL (ref 80.0–100.0)
Platelets: 291 10*3/uL (ref 150–400)
RBC: 3.6 MIL/uL — ABNORMAL LOW (ref 4.22–5.81)
RDW: 14.1 % (ref 11.5–15.5)
WBC: 10 10*3/uL (ref 4.0–10.5)
nRBC: 0 % (ref 0.0–0.2)

## 2022-05-23 LAB — GLUCOSE, CAPILLARY
Glucose-Capillary: 150 mg/dL — ABNORMAL HIGH (ref 70–99)
Glucose-Capillary: 151 mg/dL — ABNORMAL HIGH (ref 70–99)
Glucose-Capillary: 151 mg/dL — ABNORMAL HIGH (ref 70–99)
Glucose-Capillary: 245 mg/dL — ABNORMAL HIGH (ref 70–99)
Glucose-Capillary: 274 mg/dL — ABNORMAL HIGH (ref 70–99)
Glucose-Capillary: 354 mg/dL — ABNORMAL HIGH (ref 70–99)

## 2022-05-23 MED ORDER — FENTANYL CITRATE PF 50 MCG/ML IJ SOSY: 12.5000 ug | PREFILLED_SYRINGE | Freq: Once | INTRAMUSCULAR | Status: DC | PRN

## 2022-05-23 MED ORDER — DIPHENHYDRAMINE HCL 50 MG/ML IJ SOLN: 50.0000 mg | Freq: Once | INTRAMUSCULAR | Status: DC | PRN

## 2022-05-23 MED ORDER — MIDAZOLAM HCL 2 MG/ML PO SYRP: 8.0000 mg | ORAL_SOLUTION | Freq: Once | ORAL | Status: DC | PRN

## 2022-05-23 MED ORDER — METHYLPREDNISOLONE SODIUM SUCC 125 MG IJ SOLR: 125.0000 mg | Freq: Once | INTRAMUSCULAR | Status: DC | PRN

## 2022-05-23 MED ORDER — ONDANSETRON HCL 4 MG/2ML IJ SOLN: 4.0000 mg | Freq: Four times a day (QID) | INTRAMUSCULAR | Status: DC | PRN

## 2022-05-23 MED ORDER — CHLORHEXIDINE GLUCONATE 4 % EX LIQD
60.0000 mL | Freq: Once | CUTANEOUS | Status: AC
  Administered 2022-05-24: 4 via TOPICAL

## 2022-05-23 MED ORDER — HYDROMORPHONE HCL 1 MG/ML IJ SOLN: 1.0000 mg | Freq: Once | INTRAMUSCULAR | Status: DC | PRN

## 2022-05-23 MED ORDER — VANCOMYCIN HCL 1500 MG/300ML IV SOLN
1500.0000 mg | INTRAVENOUS | Status: AC
  Administered 2022-05-24: 1500 mg via INTRAVENOUS
  Filled 2022-05-23: qty 300

## 2022-05-23 MED ORDER — FAMOTIDINE 20 MG PO TABS: 40.0000 mg | ORAL_TABLET | Freq: Once | ORAL | Status: DC | PRN

## 2022-05-23 MED ORDER — TRAZODONE HCL 100 MG PO TABS
100.0000 mg | ORAL_TABLET | Freq: Every evening | ORAL | Status: DC | PRN
  Administered 2022-05-25: 100 mg via ORAL
  Filled 2022-05-23: qty 1

## 2022-05-23 MED ORDER — SODIUM CHLORIDE 0.9 % IV SOLN: INTRAVENOUS | Status: DC

## 2022-05-23 MED ORDER — CHLORHEXIDINE GLUCONATE 4 % EX LIQD
60.0000 mL | Freq: Once | CUTANEOUS | Status: AC
  Administered 2022-05-23: 4 via TOPICAL

## 2022-05-23 NOTE — Progress Notes (Signed)
PODIATRY / FOOT AND ANKLE SURGERY PROGRESS NOTE  Reason for consult: Right foot wound/infection   HPI: [name]. is a 67 y.o. male who presents resting in bed comfortably with minimal to no pain today.  Patient is status post 1 to 2 days right lower extremity revascularization procedure.  Patient notes that he feels like his right foot and leg is warmer in general.  He has been getting his dressings changed daily by nursing staff.  Patient is still considering options in regards to amputation versus attempt at limb salvage.  PMHx:  Past Medical History:  Diagnosis Date   Arrhythmia    atrial fibrillation   Asthma    CHF (congestive heart failure) (HCC)    COPD (chronic obstructive pulmonary disease) (HCC)    Coronary artery disease    Depression    Diabetes mellitus without complication (HCC)    Gout    History anabolic steroid use    Hyperlipidemia    Hypertension    Hypogonadism in male    MI (myocardial infarction) ([place])    Morbid obesity ([place])    Myocardial infarction ([place])    Peripheral vascular disease ([place])    Perirectal abscess    Pleurisy    Sleep apnea    CPAP at night, no oxygen   Varicella     Surgical Hx:  Past Surgical History:  Procedure Laterality Date   ABDOMINAL AORTIC ANEURYSM REPAIR     ACHILLES TENDON SURGERY Left 01/10/2021   Procedure: ACHILLES LENGTHENING/KIDNER;  Surgeon: Caroline More, DPM;  Location: [facility];  Service: Podiatry;  Laterality: Left;   AMPUTATION Left 10/03/2021   Procedure: AMPUTATION BELOW KNEE;  Surgeon: Katha Cabal, MD;  Location: [facility];  Service: Vascular;  Laterality: Left;   AMPUTATION TOE Right 02/10/2016   Procedure: AMPUTATION TOE 3RD TOE;  Surgeon: Samara Deist, DPM;  Location: [facility];  Service: Podiatry;  Laterality: Right;   AMPUTATION TOE Left 02/24/2020   Procedure: AMPUTATION TOE;  Surgeon: Caroline More, DPM;  Location: [facility];  Service: Podiatry;  Laterality: Left;   APPLICATION OF  WOUND VAC Left 02/29/2020   Procedure: APPLICATION OF WOUND VAC;  Surgeon: Caroline More, DPM;  Location: [facility];  Service: Podiatry;  Laterality: Left;   CARDIAC CATHETERIZATION     CARDIOVERSION [name] 02/13/2022   Procedure: CARDIOVERSION;  Surgeon: Corey Skains, MD;  Location: [facility];  Service: Cardiovascular;  Laterality: [name];   COLONOSCOPY WITH PROPOFOL [name] 11/18/2015   Procedure: COLONOSCOPY WITH PROPOFOL;  Surgeon: Manya Silvas, MD;  Location: [facility];  Service: Endoscopy;  Laterality: [name];   CORONARY ARTERY BYPASS GRAFT     CORONARY STENT INTERVENTION [name] 02/02/2020   Procedure: CORONARY STENT INTERVENTION;  Surgeon: Isaias Cowman, MD;  Location: [place] [facility];  Service: Cardiovascular;  Laterality: [name];   INCISION AND DRAINAGE Left 08/07/2021   Procedure: INCISION AND DRAINAGE-Partial Calcanectomy;  Surgeon: Caroline More, DPM;  Location: [facility];  Service: Podiatry;  Laterality: Left;   IRRIGATION AND DEBRIDEMENT FOOT Left 02/29/2020   Procedure: IRRIGATION AND DEBRIDEMENT FOOT;  Surgeon: Caroline More, DPM;  Location: [facility];  Service: Podiatry;  Laterality: Left;   IRRIGATION AND DEBRIDEMENT FOOT Left 02/24/2020   Procedure: IRRIGATION AND DEBRIDEMENT FOOT;  Surgeon: Caroline More, DPM;  Location: [facility];  Service: Podiatry;  Laterality: Left;   KNEE ARTHROSCOPY     LEFT HEART CATH AND CORS/GRAFTS ANGIOGRAPHY [name] 02/02/2020   Procedure: LEFT HEART CATH AND CORS/GRAFTS ANGIOGRAPHY;  Surgeon: Teodoro Spray, MD;  Location: [place] [facility];  Service: Cardiovascular;  Laterality: [name];   LOWER EXTREMITY ANGIOGRAPHY Left 02/25/2020   Procedure: Lower Extremity Angiography;  Surgeon: Algernon Huxley, MD;  Location: [place] [facility];  Service: Cardiovascular;  Laterality: Left;   LOWER EXTREMITY ANGIOGRAPHY Left 01/04/2021   Procedure: LOWER EXTREMITY ANGIOGRAPHY;  Surgeon: Algernon Huxley, MD;  Location: [place] [facility];  Service:  Cardiovascular;  Laterality: Left;   LOWER EXTREMITY ANGIOGRAPHY Right 05/21/2022   Procedure: Lower Extremity Angiography;  Surgeon: Algernon Huxley, MD;  Location: [place] [facility];  Service: Cardiovascular;  Laterality: Right;   METATARSAL HEAD EXCISION Left 01/10/2021   Procedure: METATARSAL HEAD EXCISION - LEFT 5th;  Surgeon: Caroline More, DPM;  Location: [facility];  Service: Podiatry;  Laterality: Left;   PERIPHERAL VASCULAR CATHETERIZATION Right 01/24/2016   Procedure: Lower Extremity Angiography;  Surgeon: Katha Cabal, MD;  Location: [place] [facility];  Service: Cardiovascular;  Laterality: Right;   PERIPHERAL VASCULAR CATHETERIZATION Right 01/25/2016   Procedure: Lower Extremity Angiography;  Surgeon: Katha Cabal, MD;  Location: [place] [facility];  Service: Cardiovascular;  Laterality: Right;   TOE AMPUTATION     TONSILLECTOMY      FHx:  Family History  Problem Relation Age of Onset   Hypertension Father    Coronary artery disease Father    Alcohol abuse Father    Heart failure Brother     Social History:  reports that he quit smoking about 7 years ago. His smoking use included cigarettes. He has a 22.50 pack-year smoking history. He has never used smokeless tobacco. He reports that he does not currently use alcohol after a past usage of about 3.0 standard drinks of alcohol per week. He reports that he does not use drugs.  Allergies:  Allergies  Allergen Reactions   Penicillins Other (See Comments)    Happened at 67 years old and pt. stated he passed out  He has tolerated amoxicillin/clavulanate and ampicillin/sulbactam   Statins     Other reaction(s): Muscle Pain Causes legs to ache per pt   Metformin And Related Diarrhea    Medications Prior to Admission  Medication Sig Dispense Refill   acetaminophen (TYLENOL) 325 MG tablet Take 2 tablets (650 mg total) by mouth every 6 (six) hours as needed for mild pain (or Fever >/= 101). (Patient taking  differently: Take 650 mg by mouth every 4 (four) hours as needed for mild pain (or Fever >/= 101).)     amiodarone (PACERONE) 200 MG tablet Take 200 mg by mouth daily.     apixaban (ELIQUIS) 5 MG TABS tablet Take 5 mg by mouth 2 (two) times daily.     aspirin EC 81 MG tablet Take 81 mg by mouth daily. Swallow whole.     Cholecalciferol 25 MCG (1000 UT) tablet Take 1,000 Units by mouth daily.     Cyanocobalamin (VITAMIN B-12) 5000 MCG TBDP Take 10,000 mcg by mouth daily.     diltiazem (TIAZAC) 300 MG 24 hr capsule Take 300 mg by mouth daily.     ezetimibe (ZETIA) 10 MG tablet Take 10 mg by mouth at bedtime.     ferrous sulfate 325 (65 FE) MG tablet Take 325 mg by mouth daily with breakfast.     finasteride (PROSCAR) 5 MG tablet Take 1 tablet (5 mg total) by mouth daily. 30 tablet 0   furosemide (LASIX) 20 MG tablet Take 2 tablets (  40 mg total) by mouth daily. 30 tablet 0   gabapentin (NEURONTIN) 300 MG capsule Take 300 mg by mouth at bedtime.     HUMALOG 100 UNIT/ML injection Inject 15 Units into the skin 3 (three) times daily with meals. >200     Infant Care Products (DERMACLOUD) OINT Apply 1 Application topically daily as needed.     insulin glargine (LANTUS) 100 UNIT/ML injection Inject 40 Units into the skin.     isosorbide mononitrate (IMDUR) 60 MG 24 hr tablet Take 60 mg by mouth in the morning and at bedtime.     levothyroxine (SYNTHROID) 25 MCG tablet Take 1 tablet (25 mcg total) by mouth daily at 6 (six) AM. 30 tablet 0   losartan (COZAAR) 50 MG tablet Take 50 mg by mouth daily.     Multiple Vitamins-Minerals (MULTIVITAMIN WITH MINERALS) tablet Take 1 tablet by mouth daily.     nitrofurantoin, macrocrystal-monohydrate, (MACROBID) 100 MG capsule Take 100 mg by mouth daily.     nitroGLYCERIN (NITROSTAT) 0.4 MG SL tablet Place 0.4 mg under the tongue every 5 (five) minutes as needed for chest pain.     nystatin (MYCOSTATIN/NYSTOP) powder Apply 1 Application topically 2 (two) times daily.      pramipexole (MIRAPEX) 1 MG tablet Take 2 mg by mouth at bedtime.     primidone (MYSOLINE) 250 MG tablet Take 250 mg by mouth 2 (two) times daily.     risperiDONE (RISPERDAL) 0.5 MG tablet Take 1 tablet (0.5 mg total) by mouth 2 (two) times daily. 60 tablet 0   tamsulosin (FLOMAX) 0.4 MG CAPS capsule Take 0.4 mg by mouth daily.     vitamin C (ASCORBIC ACID) 500 MG tablet Take 500 mg by mouth daily.     zinc gluconate 50 MG tablet Take 50 mg by mouth daily.     budesonide (PULMICORT) 0.5 MG/2ML nebulizer solution Take 2 mLs (0.5 mg total) by nebulization 2 (two) times daily. (Patient not taking: Reported on 05/21/2022) 120 mL 0   insulin glargine-yfgn (SEMGLEE) 100 UNIT/ML injection Inject 0.25 mLs (25 Units total) into the skin at bedtime. (Patient not taking: Reported on 05/21/2022) 10 mL 11   traMADol (ULTRAM) 50 MG tablet Take 1 tablet (50 mg total) by mouth daily as needed for moderate pain. (Patient not taking: Reported on 05/21/2022) 10 tablet 0    Physical Exam: General: Alert and oriented.  No apparent distress.  Vascular: DP/PT pulse right lower extremity faintly palpable, mild pitting edema present to the right lower extremity.  Right lower extremity does appear to be warm to touch.  Neuro: Light touch sensation right lower extremity absent.  Derm: Ulceration present to the right lateral ankle which appears to be ischemic and pressure related, measures approximately 3 cm x 3 cm and appears to have fibronecrotic with fibula exposed.  Mild serous drainage, no odor, minimal to no erythema.  MSK: Left BKA, right partial second and third ray amputations.  Results for orders placed or performed during the hospital encounter of 05/18/22 (from the past 48 hour(s))  Glucose, capillary     Status: Abnormal   Collection Time: 05/21/22  4:03 PM  Result Value Ref Range   Glucose-Capillary 112 (H) 70 - 99 mg/dL    Comment: Glucose reference range applies only to samples taken after fasting for  at least 8 hours.  Glucose, capillary     Status: Abnormal   Collection Time: 05/21/22  9:18 PM  Result Value Ref Range  Glucose-Capillary 256 (H) 70 - 99 mg/dL    Comment: Glucose reference range applies only to samples taken after fasting for at least 8 hours.  Glucose, capillary     Status: Abnormal   Collection Time: 05/21/22 11:21 PM  Result Value Ref Range   Glucose-Capillary 289 (H) 70 - 99 mg/dL    Comment: Glucose reference range applies only to samples taken after fasting for at least 8 hours.  Glucose, capillary     Status: Abnormal   Collection Time: 05/21/22 11:54 PM  Result Value Ref Range   Glucose-Capillary 251 (H) 70 - 99 mg/dL    Comment: Glucose reference range applies only to samples taken after fasting for at least 8 hours.   Comment 1 Notify RN   Glucose, capillary     Status: Abnormal   Collection Time: 05/22/22  4:26 AM  Result Value Ref Range   Glucose-Capillary 185 (H) 70 - 99 mg/dL    Comment: Glucose reference range applies only to samples taken after fasting for at least 8 hours.   Comment 1 Notify RN   Hemoglobin A1c     Status: Abnormal   Collection Time: 05/22/22  4:40 AM  Result Value Ref Range   Hgb A1c MFr Bld 11.2 (H) 4.8 - 5.6 %    Comment: (NOTE) Pre diabetes:          5.7%-6.4%  Diabetes:              >6.4%  Glycemic control for   <7.0% adults with diabetes    Mean Plasma Glucose 274.74 mg/dL    Comment: Performed at [facility] [address] [place], [place] [address]  CBC     Status: Abnormal   Collection Time: 05/22/22  4:42 AM  Result Value Ref Range   WBC 11.3 (H) 4.0 - 10.5 K/uL   RBC 3.30 (L) 4.22 - 5.81 MIL/uL   Hemoglobin 9.2 (L) 13.0 - 17.0 g/dL   HCT 29.6 (L) 39.0 - 52.0 %   MCV 89.7 80.0 - 100.0 fL   MCH 27.9 26.0 - 34.0 pg   MCHC 31.1 30.0 - 36.0 g/dL   RDW 14.2 11.5 - 15.5 %   Platelets 305 150 - 400 K/uL   nRBC 0.0 0.0 - 0.2 %    Comment: Performed at [facility], [address],  [place], [place] [address]  Basic metabolic panel     Status: Abnormal   Collection Time: 05/22/22  4:42 AM  Result Value Ref Range   Sodium 133 (L) 135 - 145 mmol/L   Potassium 4.1 3.5 - 5.1 mmol/L   Chloride 100 98 - 111 mmol/L   CO2 27 22 - 32 mmol/L   Glucose, Bld 185 (H) 70 - 99 mg/dL    Comment: Glucose reference range applies only to samples taken after fasting for at least 8 hours.   BUN 33 (H) 8 - 23 mg/dL   Creatinine, Ser 0.93 0.61 - 1.24 mg/dL   Calcium 8.8 (L) 8.9 - 10.3 mg/dL   GFR, Estimated >60 >60 mL/min    Comment: (NOTE) Calculated using the CKD-EPI Creatinine Equation (2021)    Anion gap 6 5 - 15    Comment: Performed at [facility], [address] [place], [place] [address]  Glucose, capillary     Status: Abnormal   Collection Time: 05/22/22  7:48 AM  Result Value Ref Range   Glucose-Capillary 128 (H) 70 - 99 mg/dL  Comment: Glucose reference range applies only to samples taken after fasting for at least 8 hours.  Glucose, capillary     Status: Abnormal   Collection Time: 05/22/22 11:40 AM  Result Value Ref Range   Glucose-Capillary 217 (H) 70 - 99 mg/dL    Comment: Glucose reference range applies only to samples taken after fasting for at least 8 hours.  Glucose, capillary     Status: Abnormal   Collection Time: 05/22/22  3:41 PM  Result Value Ref Range   Glucose-Capillary 208 (H) 70 - 99 mg/dL    Comment: Glucose reference range applies only to samples taken after fasting for at least 8 hours.  Glucose, capillary     Status: Abnormal   Collection Time: 05/22/22  4:49 PM  Result Value Ref Range   Glucose-Capillary 185 (H) 70 - 99 mg/dL    Comment: Glucose reference range applies only to samples taken after fasting for at least 8 hours.   Comment 1 Notify RN   Glucose, capillary     Status: Abnormal   Collection Time: 05/22/22  8:27 PM  Result Value Ref Range   Glucose-Capillary 158 (H) 70 - 99 mg/dL    Comment: Glucose reference range applies  only to samples taken after fasting for at least 8 hours.  Glucose, capillary     Status: Abnormal   Collection Time: 05/23/22 12:18 AM  Result Value Ref Range   Glucose-Capillary 150 (H) 70 - 99 mg/dL    Comment: Glucose reference range applies only to samples taken after fasting for at least 8 hours.  CBC     Status: Abnormal   Collection Time: 05/23/22  4:17 AM  Result Value Ref Range   WBC 10.0 4.0 - 10.5 K/uL   RBC 3.60 (L) 4.22 - 5.81 MIL/uL   Hemoglobin 10.1 (L) 13.0 - 17.0 g/dL   HCT 32.6 (L) 39.0 - 52.0 %   MCV 90.6 80.0 - 100.0 fL   MCH 28.1 26.0 - 34.0 pg   MCHC 31.0 30.0 - 36.0 g/dL   RDW 14.1 11.5 - 15.5 %   Platelets 291 150 - 400 K/uL   nRBC 0.0 0.0 - 0.2 %    Comment: Performed at [facility], [address], [place], [place] [address]  Basic metabolic panel     Status: Abnormal   Collection Time: 05/23/22  4:17 AM  Result Value Ref Range   Sodium 135 135 - 145 mmol/L   Potassium 3.8 3.5 - 5.1 mmol/L   Chloride 104 98 - 111 mmol/L   CO2 24 22 - 32 mmol/L   Glucose, Bld 152 (H) 70 - 99 mg/dL    Comment: Glucose reference range applies only to samples taken after fasting for at least 8 hours.   BUN 39 (H) 8 - 23 mg/dL   Creatinine, Ser 1.03 0.61 - 1.24 mg/dL   Calcium 9.0 8.9 - 10.3 mg/dL   GFR, Estimated >60 >60 mL/min    Comment: (NOTE) Calculated using the CKD-EPI Creatinine Equation (2021)    Anion gap 7 5 - 15    Comment: Performed at [facility], [address] [place], [place] [address]  Glucose, capillary     Status: Abnormal   Collection Time: 05/23/22  4:17 AM  Result Value Ref Range   Glucose-Capillary 151 (H) 70 - 99 mg/dL    Comment: Glucose reference range applies only to samples taken after fasting for at least 8 hours.   Comment 1  Notify RN   Glucose, capillary     Status: Abnormal   Collection Time: 05/23/22  7:28 AM  Result Value Ref Range   Glucose-Capillary 151 (H) 70 - 99 mg/dL    Comment: Glucose reference  range applies only to samples taken after fasting for at least 8 hours.  Glucose, capillary     Status: Abnormal   Collection Time: 05/23/22 11:25 AM  Result Value Ref Range   Glucose-Capillary 245 (H) 70 - 99 mg/dL    Comment: Glucose reference range applies only to samples taken after fasting for at least 8 hours.   PERIPHERAL VASCULAR CATHETERIZATION  Result Date: 05/21/2022 See surgical note for result.   Blood pressure (!) 152/79, pulse 86, temperature (!) 97.5 F (36.4 C), resp. rate 19, height '6\' 4"'$  (1.93 m), weight 135.5 kg, SpO2 97 %.  Assessment Acute osteomyelitis right fibula secondary to ischemic ulceration to the right lateral ankle Diabetes type 2 polyneuropathy PVD History left BKA, right partial ray amputations.  Plan -Patient seen and examined. -Previous x-ray imaging as well as MRI reviewed and discussed with patient detail showing osteomyelitis to the distal fibula in the area of the pressure ulceration. -Patient appears to have an ischemic ulceration to the right lateral ankle with fibronecrotic With obvious bone exposed at the level of the ulcerative site. -Appreciate medicine recommendations for antibiotic therapy.  Order placed for wound culture. -Continue daily dressing changes consisting of applying Betadine directly to the area with sponge dressing. -Discussed treatment options in regards to patient's status due to infection present.  Believe the patient has 2 options for this.  Limb salvage would be very difficult due to history of uncontrolled diabetes as well as significant peripheral vascular disease as well as osteomyelitis present and expansiveness of wound.  Limb salvage would involve IV antibiotic therapy for 6 weeks, surgical wound debridement with application of antibiotic beads and wound VAC, HBO therapy in an outpatient setting along with local wound care and potentially skin grafting.  Believe that this is not likely to work due to significant past  medical history and extensiveness of infection and wound.  Second option is to perform right below-knee amputation.  Patient once again wants to consider options but will make a decision today at some point.  Patient would like to discuss with his wife. -If he does elect for the conservative route would plan on potentially surgical intervention on Friday afternoon.  If he does elect for below-knee amputation then would defer to vascular recommendations.  Caroline More, DPM 05/23/2022, 12:27 PM

## 2022-05-23 NOTE — TOC Progression Note (Signed)
Transition of Care (TOC) - Progression Note    Patient Details  Name: [name]. MRN: [id number] Date of Birth: [birth year]  Transition of Care [facility]) CM/SW [place], RN Phone Number: 05/23/2022, 1:56 PM  Clinical Narrative:    TOC continues to follow and await treatment decision, per Podiatry Limb salvage would involve IV antibiotic therapy for 6 weeks, surgical wound debridement with application of antibiotic beads and wound VAC, HBO therapy in an outpatient setting along with local wound care and potentially skin grafting.  He will talk to his wife and discuss and make a choice of treatment options, If BKA If he does elect for the conservative route would plan on potentially surgical intervention on Friday afternoon. If he does elect for below-knee amputation then would defer to vascular recommendations.    Expected Discharge Plan: Home/Self Care Barriers to Discharge: Continued Medical Work up  Expected Discharge Plan and Services   Discharge Planning Services: CM Consult   Living arrangements for the past 2 months: [place]: [place] Determinants of Health (SDOH) Interventions SDOH Screenings   Food Insecurity: No Food Insecurity (05/19/2022)  Housing: Low Risk  (05/19/2022)  Transportation Needs: No Transportation Needs (05/19/2022)  Recent Concern: Transportation Needs - Unmet Transportation Needs (03/21/2022)  Utilities: Not At Risk (05/19/2022)  Depression (PHQ2-9): Low Risk  (11/21/2021)  Tobacco Use: Medium Risk (05/22/2022)    Readmission Risk Interventions    02/13/2022    4:57 PM 09/29/2021   11:12 AM  Readmission Risk Prevention Plan  Transportation Screening Complete Complete  PCP or Specialist Appt within 3-5 Days  Complete  HRI or [place]  Complete  Social Work Consult for [place] Planning/Counseling  Complete  Palliative Care Screening   Not Applicable  Medication Review Press photographer) Complete Complete  PCP or Specialist appointment within 3-5 days of discharge Complete   HRI or [place] Complete   SW Recovery Care/Counseling Consult Complete   [place] Not Applicable

## 2022-05-23 NOTE — Plan of Care (Signed)
  Problem: Activity: Goal: Risk for activity intolerance will decrease Outcome: Progressing   Problem: Nutrition: Goal: Adequate nutrition will be maintained Outcome: Progressing   Problem: Coping: Goal: Level of anxiety will decrease Outcome: Progressing   Problem: Elimination: Goal: Will not experience complications related to bowel motility Outcome: Progressing Goal: Will not experience complications related to urinary retention Outcome: Progressing   Problem: Pain Managment: Goal: General experience of comfort will improve Outcome: Progressing   Problem: Safety: Goal: Ability to remain free from injury will improve Outcome: Progressing   

## 2022-05-23 NOTE — Hospital Course (Addendum)
This is a pleasant 67 year old gentleman with a history of COPD, chronic respiratory failure on home O2, CAD status post CABG, insulin-dependent type 2 diabetes, OSA on BiPAP at night, BPH hypertension, hypothyroidism and paroxysmal atrial fibrillation on Eliquis and amiodarone who has peripheral arterial disease status post left BKA and is admitted to the hospital with concern for right ankle osteomyelitis after an injury in December 2023.  He was seen in consultation by podiatry, who recommends continuation of empiric IV antibiotics, underwent angiogram and angioplasty of the right anterior tibial artery with vascular surgery on 01/22. After consideration, opted for R BKA 01/24 given low likelihood of healing with non-surgical management. Awaiting SNF placement. Stopped abx 01/26 Patient does not have resources to pay for SNF rehab.  As result, he was discharged to home with home care.  But patient has been appealing the discharge.

## 2022-05-23 NOTE — Progress Notes (Signed)
PROGRESS NOTE    Jacon Lennox Solders.   JJO:[id number] DOB: [birth year]  DOA: 05/18/2022 Date of Service: 05/23/22 PCP: [name], MD     Brief Narrative / Hospital Course:  This is a pleasant 67 year old gentleman with a history of COPD, chronic respiratory failure on home O2, CAD status post CABG, insulin-dependent type 2 diabetes, OSA on BiPAP at night, BPH hypertension, hypothyroidism and paroxysmal atrial fibrillation on Eliquis and amiodarone who has peripheral arterial disease status post left BKA and is admitted to the hospital with concern for right ankle osteomyelitis after an injury in December 2023.  He was seen in consultation by podiatry, who recommends continuation of empiric IV antibiotics, underwent angiogram and angioplasty of the right anterior tibial artery with vascular surgery on 1/22.   Consultants:  Podiatry Vascular Surgery   Procedures: Angiogram with angioplasty 1/22 with vascular surgery      ASSESSMENT & PLAN:   Principal Problem:   Non-healing wound of lower extremity, initial encounter Active Problems:   Acute osteomyelitis of right foot (HCC)   PAD (peripheral artery disease) (HCC)   S/P BKA (below knee amputation), left (HCC)   AKI (acute kidney injury) ([place])   Hyperkalemia   Chronic anticoagulation   AF (paroxysmal atrial fibrillation) ([place])   Uncontrolled type 2 diabetes mellitus with hypoglycemia, with long-term current use of insulin (HCC)   OSA (obstructive sleep apnea)   Chronic respiratory failure with hypoxia (HCC)   COPD (chronic obstructive pulmonary disease) (HCC)   Chronic diastolic CHF (congestive heart failure) (HCC)   CAD with Hx of CABG   Essential hypertension   BPH (benign prostatic hyperplasia)  Acute osteomyelitis of right foot (HCC) Nonhealing wound right ankle PAD s/p left BKA Angiogram with angioplasty 1/22 with vascular surgery Continue empiric cefepime and vancomycin for now await further  recommendations from podiatry and vascular --> If Podiatry feels foot is unsalvageable, vascular team can discuss amputation with patient. Per Podiatry note today "Limb salvage would be very difficult due to history of uncontrolled diabetes as well as significant peripheral vascular disease as well as osteomyelitis present and expansiveness of wound. Limb salvage would involve IV antibiotic therapy for 6 weeks, surgical wound debridement with application of antibiotic beads and wound VAC, HBO therapy in an outpatient setting along with local wound care and potentially skin grafting. Believe that this is not likely to work due to significant past medical history and extensiveness of infection and wound. Second option is to perform right below-knee amputation. Patient once again wants to consider options."    Hyperkalemia Received a dose of Lokelma in the ED for potassium of 5.6, this is now resolved Monitor BMP   AKI (acute kidney injury) ([place]) Creatinine 1.3 at admission, now improving IV hydration and monitor.   Avoid nephrotoxins and renally dose all meds   Uncontrolled type 2 diabetes mellitus with hypoglycemia, with long-term current use of insulin (HCC) Blood sugar in the 300s on admission initially Continue home basal insulin Sliding scale coverage   AF (paroxysmal atrial fibrillation) (HCC) Chronic anticoagulation Continue amiodarone Restarted apixiban yesterday 01/23   COPD (chronic obstructive pulmonary disease) (HCC) OSA on home BiPAP Chronic respiratory failure on home O2 Patient is at baseline Continue home Pulmicort and inhalers Continue BiPAP and O2 at night and anytime he is sleeping   CAD with Hx of CABG Continue aspirin, ezetimibe, Imdur, losartan   Chronic diastolic CHF (congestive heart failure) (HCC) Clinically euvolemic Continue Lasix, losartan   Essential hypertension BP  controlled.   Continue home antihypertensives   BPH (benign prostatic  hyperplasia) Continue finasteride and tamsulosin     DVT prophylaxis: lovenox  Pertinent IV fluids/nutrition: no continuous IV fluids  Central lines / invasive devices: none   Code Status: FULL CODE   Current Admission Status: inpatient   TOC needs / Dispo plan: none at this time Barriers to discharge / significant pending items: pending possible amputation cs surgical debridement, will likely be here through end of this week / into early next week.              Subjective / Brief ROS:  Patient reports foot is feeling warmer today  Denies CP/SOB.  Pain controlled.  Denies new weakness.  Tolerating diet.  Reports no concerns w/ urination/defecation.   Family Communication: none at this time     Objective Findings:  Vitals:   05/22/22 2128 05/22/22 2133 05/22/22 2337 05/23/22 0728  BP:   122/64 (!) 152/79  Pulse:   77 86  Resp:   18 19  Temp:   (!) 97.4 F (36.3 C) (!) 97.5 F (36.4 C)  TempSrc:      SpO2: 98% 98% 97% 97%  Weight:      Height:        Intake/Output Summary (Last 24 hours) at 05/23/2022 1600 Last data filed at 05/23/2022 1202 Gross per 24 hour  Intake 240 ml  Output 3475 ml  Net -3235 ml   Filed Weights   05/18/22 2150  Weight: 135.5 kg    Examination:  Physical Exam Constitutional:      Appearance: Normal appearance. He is obese. He is not ill-appearing.  Cardiovascular:     Rate and Rhythm: Normal rate and regular rhythm.  Pulmonary:     Effort: Pulmonary effort is normal.     Breath sounds: Normal breath sounds.  Abdominal:     Palpations: Abdomen is soft.  Musculoskeletal:     Right lower leg: No edema.     Comments: LLE BKA  Skin:    General: Skin is dry.  Neurological:     General: No focal deficit present.     Mental Status: He is alert and oriented to person, place, and time.          Scheduled Medications:   amiodarone  200 mg Oral Daily   ascorbic acid  500 mg Oral BID   aspirin EC  81 mg Oral Daily    budesonide  0.5 mg Nebulization BID   clopidogrel  75 mg Oral Daily   cyanocobalamin  5,000 mcg Oral Daily   enoxaparin (LOVENOX) injection  0.5 mg/kg Subcutaneous Q24H   ezetimibe  10 mg Oral Daily   ferrous sulfate  325 mg Oral Q breakfast   finasteride  5 mg Oral Daily   furosemide  40 mg Oral Daily   gabapentin  300 mg Oral QHS   insulin aspart  0-15 Units Subcutaneous TID WC   insulin aspart  0-5 Units Subcutaneous QHS   insulin aspart  3 Units Subcutaneous TID WC   insulin glargine-yfgn  25 Units Subcutaneous QHS   isosorbide mononitrate  60 mg Oral Daily   levothyroxine  25 mcg Oral Q0600   losartan  50 mg Oral Daily   metroNIDAZOLE  500 mg Oral Q12H   multivitamin with minerals  1 tablet Oral Daily   nutrition supplement (JUVEN)  1 packet Oral BID BM   pramipexole  2 mg Oral QHS   primidone  250 mg Oral BID   Ensure Max Protein  11 oz Oral QHS   risperiDONE  0.5 mg Oral BID   tamsulosin  0.4 mg Oral Daily   zinc sulfate  220 mg Oral Daily    Continuous Infusions:  ceFEPime (MAXIPIME) IV 2 g (05/23/22 0625)   vancomycin 1,250 mg (05/23/22 1154)    PRN Medications:  acetaminophen **OR** acetaminophen, albuterol, fentaNYL (SUBLIMAZE) injection, HYDROcodone-acetaminophen, HYDROmorphone (DILAUDID) injection, morphine injection, ondansetron **OR** ondansetron (ZOFRAN) IV  Antimicrobials from admission:  Anti-infectives (From admission, onward)    Start     Dose/Rate Route Frequency Ordered Stop   05/21/22 1655  ciprofloxacin (CIPRO) IVPB  Status:  Discontinued        over 60 Minutes  Continuous PRN 05/21/22 1655 05/21/22 1737   05/21/22 1515  ciprofloxacin (CIPRO) IVPB 400 mg  Status:  Discontinued        400 mg 200 mL/hr over 60 Minutes Intravenous 60 min pre-op 05/21/22 1515 05/21/22 1736   05/19/22 1200  vancomycin (VANCOREADY) IVPB 1250 mg/250 mL        1,250 mg 166.7 mL/hr over 90 Minutes Intravenous Every 12 hours 05/19/22 0739     05/19/22 0100  vancomycin  (VANCOREADY) IVPB 500 mg/100 mL       See Hyperspace for full Linked Orders Report.   500 mg 100 mL/hr over 60 Minutes Intravenous  Once 05/18/22 2209 05/19/22 0241   05/18/22 2300  vancomycin (VANCOREADY) IVPB 2000 mg/400 mL       See Hyperspace for full Linked Orders Report.   2,000 mg 200 mL/hr over 120 Minutes Intravenous  Once 05/18/22 2209 05/19/22 0138   05/18/22 2300  ceFEPIme (MAXIPIME) 2 g in sodium chloride 0.9 % 100 mL IVPB        2 g 200 mL/hr over 30 Minutes Intravenous Every 8 hours 05/18/22 2209     05/18/22 2210  vancomycin variable dose per unstable renal function (pharmacist dosing)  Status:  Discontinued         Does not apply See admin instructions 05/18/22 2211 05/20/22 0908   05/18/22 2145  metroNIDAZOLE (FLAGYL) tablet 500 mg        500 mg Oral Every 12 hours 05/18/22 2116 05/25/22 2144   05/18/22 2000  metroNIDAZOLE (FLAGYL) IVPB 500 mg  Status:  Discontinued        500 mg 100 mL/hr over 60 Minutes Intravenous  Once 05/18/22 1948 05/18/22 2144   05/18/22 2000  ceFEPIme (MAXIPIME) 2 g in sodium chloride 0.9 % 100 mL IVPB  Status:  Discontinued        2 g 200 mL/hr over 30 Minutes Intravenous  Once 05/18/22 1959 05/18/22 2209   05/18/22 2000  vancomycin (VANCOCIN) IVPB 1000 mg/200 mL premix  Status:  Discontinued        1,000 mg 200 mL/hr over 60 Minutes Intravenous  Once 05/18/22 1959 05/18/22 2209           Data Reviewed:  I have personally reviewed the following...  CBC: Recent Labs  Lab 05/18/22 1527 05/19/22 0531 05/20/22 0642 05/21/22 0431 05/22/22 0442 05/23/22 0417  WBC 11.8* 12.2* 13.2* 11.7* 11.3* 10.0  NEUTROABS 9.9*  --   --   --   --   --   HGB 10.8* 9.3* 10.1* 9.9* 9.2* 10.1*  HCT 35.3* 29.8* 31.9* 31.6* 29.6* 32.6*  MCV 91.0 90.0 89.1 89.0 89.7 90.6  PLT 345 339 323 332 305 720   Basic Metabolic Panel:  Recent Labs  Lab 05/19/22 0531 05/20/22 0642 05/21/22 0431 05/22/22 0442 05/23/22 0417  NA 137 133* 135 133* 135  K 4.6  4.7 4.3 4.1 3.8  CL 103 101 101 100 104  CO2 '28 25 25 27 24  '$ GLUCOSE 144* 101* 167* 185* 152*  BUN 28* 28* 33* 33* 39*  CREATININE 1.07 0.90 0.99 0.93 1.03  CALCIUM 8.9 8.7* 9.1 8.8* 9.0   GFR: Estimated Creatinine Clearance: 104.6 mL/min (by C-G formula based on SCr of 1.03 mg/dL). Liver Function Tests: Recent Labs  Lab 05/18/22 1527  AST 18  ALT 25  ALKPHOS 170*  BILITOT 0.3  PROT 7.4  ALBUMIN 2.6*   No results for input(s): "LIPASE", "AMYLASE" in the last 168 hours. No results for input(s): "AMMONIA" in the last 168 hours. Coagulation Profile: Recent Labs  Lab 05/18/22 1527  INR 1.3*   Cardiac Enzymes: No results for input(s): "CKTOTAL", "CKMB", "CKMBINDEX", "TROPONINI" in the last 168 hours. BNP (last 3 results) No results for input(s): "PROBNP" in the last 8760 hours. HbA1C: Recent Labs    05/22/22 0440  HGBA1C 11.2*   CBG: Recent Labs  Lab 05/22/22 2027 05/23/22 0018 05/23/22 0417 05/23/22 0728 05/23/22 1125  GLUCAP 158* 150* 151* 151* 245*   Lipid Profile: No results for input(s): "CHOL", "HDL", "LDLCALC", "TRIG", "CHOLHDL", "LDLDIRECT" in the last 72 hours. Thyroid Function Tests: No results for input(s): "TSH", "T4TOTAL", "FREET4", "T3FREE", "THYROIDAB" in the last 72 hours. Anemia Panel: No results for input(s): "VITAMINB12", "FOLATE", "FERRITIN", "TIBC", "IRON", "RETICCTPCT" in the last 72 hours. Most Recent Urinalysis On File:     Component Value Date/Time   COLORURINE YELLOW (A) 05/20/2022 0134   APPEARANCEUR CLEAR (A) 05/20/2022 0134   LABSPEC 1.013 05/20/2022 0134   PHURINE 6.0 05/20/2022 0134   GLUCOSEU 50 (A) 05/20/2022 0134   HGBUR NEGATIVE 05/20/2022 0134   BILIRUBINUR NEGATIVE 05/20/2022 0134   KETONESUR NEGATIVE 05/20/2022 0134   PROTEINUR 100 (A) 05/20/2022 0134   NITRITE NEGATIVE 05/20/2022 0134   LEUKOCYTESUR NEGATIVE 05/20/2022 0134   Sepsis Labs: '@LABRCNTIP'$ (procalcitonin:4,lacticidven:4) Microbiology: Recent Results  (from the past 240 hour(s))  Culture, blood (Routine x 2)     Status: None   Collection Time: 05/18/22  3:27 PM   Specimen: BLOOD  Result Value Ref Range Status   Specimen Description BLOOD RIGHT [facility]  Final   Special Requests   Final    BOTTLES DRAWN AEROBIC AND ANAEROBIC Blood Culture adequate volume   Culture   Final    NO GROWTH 5 DAYS Performed at [facility], [address] [place], [place] [address]    Report Status 05/23/2022 FINAL  Final  Culture, blood (Routine X 2) w Reflex to ID Panel     Status: None   Collection Time: 05/18/22 10:10 PM   Specimen: BLOOD  Result Value Ref Range Status   Specimen Description BLOOD BLOOD LEFT ARM  Final   Special Requests   Final    BOTTLES DRAWN AEROBIC AND ANAEROBIC Blood Culture adequate volume   Culture   Final    NO GROWTH 5 DAYS Performed at [facility], [address], [place], [place] [address]    Report Status 05/23/2022 FINAL  Final      Radiology Studies last 3 days: PERIPHERAL VASCULAR CATHETERIZATION  Result Date: 05/21/2022 See surgical note for result.            LOS: 5 days       Emeterio Reeve, DO [facility]  05/23/2022, 4:00 PM    Dictation software may have been used to generate the above note. Typos may occur and escape review in typed/dictated notes. Please contact Dr Sheppard Coil directly for clarity if needed.  Staff may message me via secure chat in [place]  but this may not receive an immediate response,  please page me for urgent matters!  If 7PM-7AM, please contact night coverage [email or web address]

## 2022-05-23 NOTE — Plan of Care (Signed)
  Problem: Activity: Goal: Risk for activity intolerance will decrease Outcome: Progressing   Problem: Nutrition: Goal: Adequate nutrition will be maintained Outcome: Progressing   Problem: Coping: Goal: Level of anxiety will decrease Outcome: Progressing   Problem: Safety: Goal: Ability to remain free from injury will improve Outcome: Progressing   Problem: Skin Integrity: Goal: Risk for impaired skin integrity will decrease Outcome: Progressing   Problem: Skin Integrity: Goal: Risk for impaired skin integrity will decrease Outcome: Progressing   Problem: Tissue Perfusion: Goal: Adequacy of tissue perfusion will improve Outcome: Progressing

## 2022-05-24 ENCOUNTER — Inpatient Hospital Stay: Payer: Medicare Other | Admitting: Anesthesiology

## 2022-05-24 ENCOUNTER — Other Ambulatory Visit: Payer: Self-pay

## 2022-05-24 ENCOUNTER — Encounter
Admission: EM | Disposition: A | Discharge status: Home Health | Payer: Medicare Other | Source: Ambulatory Visit | Attending: Osteopathic Medicine

## 2022-05-24 DIAGNOSIS — S81809A Unspecified open wound, unspecified lower leg, initial encounter: Secondary | ICD-10-CM | POA: Diagnosis not present

## 2022-05-24 DIAGNOSIS — I96 Gangrene, not elsewhere classified: Secondary | ICD-10-CM

## 2022-05-24 HISTORY — PX: AMPUTATION: SHX166

## 2022-05-24 LAB — CBC
HCT: 30.7 % — ABNORMAL LOW (ref 39.0–52.0)
Hemoglobin: 9.6 g/dL — ABNORMAL LOW (ref 13.0–17.0)
MCH: 28 pg (ref 26.0–34.0)
MCHC: 31.3 g/dL (ref 30.0–36.0)
MCV: 89.5 fL (ref 80.0–100.0)
Platelets: 316 10*3/uL (ref 150–400)
RBC: 3.43 MIL/uL — ABNORMAL LOW (ref 4.22–5.81)
RDW: 14.1 % (ref 11.5–15.5)
WBC: 11.2 10*3/uL — ABNORMAL HIGH (ref 4.0–10.5)
nRBC: 0 % (ref 0.0–0.2)

## 2022-05-24 LAB — BASIC METABOLIC PANEL
Anion gap: 8 (ref 5–15)
BUN: 43 mg/dL — ABNORMAL HIGH (ref 8–23)
CO2: 23 mmol/L (ref 22–32)
Calcium: 8.7 mg/dL — ABNORMAL LOW (ref 8.9–10.3)
Chloride: 101 mmol/L (ref 98–111)
Creatinine, Ser: 1 mg/dL (ref 0.61–1.24)
GFR, Estimated: >60 mL/min (ref >60)
Glucose, Bld: 257 mg/dL — ABNORMAL HIGH (ref 70–99)
Potassium: 4.3 mmol/L (ref 3.5–5.1)
Sodium: 132 mmol/L — ABNORMAL LOW (ref 135–145)

## 2022-05-24 LAB — GLUCOSE, CAPILLARY
Glucose-Capillary: 207 mg/dL — ABNORMAL HIGH (ref 70–99)
Glucose-Capillary: 166 mg/dL — ABNORMAL HIGH (ref 70–99)
Glucose-Capillary: 152 mg/dL — ABNORMAL HIGH (ref 70–99)
Glucose-Capillary: 177 mg/dL — ABNORMAL HIGH (ref 70–99)
Glucose-Capillary: 194 mg/dL — ABNORMAL HIGH (ref 70–99)
Glucose-Capillary: 197 mg/dL — ABNORMAL HIGH (ref 70–99)
Glucose-Capillary: 325 mg/dL — ABNORMAL HIGH (ref 70–99)

## 2022-05-24 LAB — HEMOGLOBIN AND HEMATOCRIT, BLOOD
HCT: 31.4 % — ABNORMAL LOW (ref 39.0–52.0)
Hemoglobin: 9.9 g/dL — ABNORMAL LOW (ref 13.0–17.0)

## 2022-05-24 SURGERY — AMPUTATION BELOW KNEE
Anesthesia: General | Site: Knee | Laterality: Right

## 2022-05-24 MED ORDER — VASOPRESSIN 20 UNIT/ML IV SOLN
INTRAVENOUS | Status: AC
  Filled 2022-05-24: qty 1

## 2022-05-24 MED ORDER — IPRATROPIUM-ALBUTEROL 0.5-2.5 (3) MG/3ML IN SOLN
3.0000 mL | RESPIRATORY_TRACT | Status: AC
  Administered 2022-05-24: 3 mL via RESPIRATORY_TRACT

## 2022-05-24 MED ORDER — ACETAMINOPHEN 10 MG/ML IV SOLN
INTRAVENOUS | Status: AC
  Filled 2022-05-24: qty 100

## 2022-05-24 MED ORDER — FENTANYL CITRATE (PF) 100 MCG/2ML IJ SOLN
INTRAMUSCULAR | Status: AC
  Administered 2022-05-24: 25 ug via INTRAVENOUS
  Filled 2022-05-24: qty 2

## 2022-05-24 MED ORDER — IPRATROPIUM-ALBUTEROL 0.5-2.5 (3) MG/3ML IN SOLN
RESPIRATORY_TRACT | Status: AC
  Filled 2022-05-24: qty 3

## 2022-05-24 MED ORDER — PROPOFOL 10 MG/ML IV BOLUS
INTRAVENOUS | Status: DC | PRN
  Administered 2022-05-24: 120 mg via INTRAVENOUS

## 2022-05-24 MED ORDER — SODIUM CHLORIDE 0.9 % IV BOLUS
250.0000 mL | INTRAVENOUS | Status: AC
  Administered 2022-05-24: 250 mL via INTRAVENOUS

## 2022-05-24 MED ORDER — ACETAMINOPHEN 325 MG PO TABS: 325.0000 mg | ORAL_TABLET | ORAL | Status: DC | PRN

## 2022-05-24 MED ORDER — PROPOFOL 10 MG/ML IV BOLUS
INTRAVENOUS | Status: AC
  Filled 2022-05-24: qty 20

## 2022-05-24 MED ORDER — SODIUM CHLORIDE 0.9 % IV BOLUS
500.0000 mL | INTRAVENOUS | Status: AC
  Administered 2022-05-24: 500 mL via INTRAVENOUS

## 2022-05-24 MED ORDER — SUCCINYLCHOLINE CHLORIDE 200 MG/10ML IV SOSY
PREFILLED_SYRINGE | INTRAVENOUS | Status: DC | PRN
  Administered 2022-05-24: 120 mg via INTRAVENOUS

## 2022-05-24 MED ORDER — FENTANYL CITRATE (PF) 100 MCG/2ML IJ SOLN: 25.0000 ug | INTRAMUSCULAR | Status: DC | PRN

## 2022-05-24 MED ORDER — KETOROLAC TROMETHAMINE 30 MG/ML IJ SOLN
30.0000 mg | Freq: Once | INTRAMUSCULAR | Status: AC
  Administered 2022-05-24: 30 mg via INTRAVENOUS
  Filled 2022-05-24: qty 1

## 2022-05-24 MED ORDER — FENTANYL CITRATE (PF) 100 MCG/2ML IJ SOLN
INTRAMUSCULAR | Status: DC | PRN
  Administered 2022-05-24 (×2): 25 ug via INTRAVENOUS
  Administered 2022-05-24: 50 ug via INTRAVENOUS

## 2022-05-24 MED ORDER — MIDAZOLAM HCL 2 MG/2ML IJ SOLN
INTRAMUSCULAR | Status: AC
  Filled 2022-05-24: qty 2

## 2022-05-24 MED ORDER — METOPROLOL TARTRATE 5 MG/5ML IV SOLN
INTRAVENOUS | Status: DC | PRN
  Administered 2022-05-24 (×2): 1 mg via INTRAVENOUS

## 2022-05-24 MED ORDER — VASOPRESSIN 20 UNIT/ML IV SOLN
INTRAVENOUS | Status: DC | PRN
  Administered 2022-05-24: 2 [IU] via INTRAVENOUS

## 2022-05-24 MED ORDER — ROCURONIUM BROMIDE 100 MG/10ML IV SOLN
INTRAVENOUS | Status: DC | PRN
  Administered 2022-05-24: 20 mg via INTRAVENOUS
  Administered 2022-05-24: 50 mg via INTRAVENOUS
  Administered 2022-05-24 (×2): 10 mg via INTRAVENOUS

## 2022-05-24 MED ORDER — FENTANYL CITRATE (PF) 100 MCG/2ML IJ SOLN
INTRAMUSCULAR | Status: AC
  Filled 2022-05-24: qty 2

## 2022-05-24 MED ORDER — DROPERIDOL 2.5 MG/ML IJ SOLN: 0.6250 mg | Freq: Once | INTRAMUSCULAR | Status: DC | PRN

## 2022-05-24 MED ORDER — ESMOLOL HCL 100 MG/10ML IV SOLN
INTRAVENOUS | Status: AC
  Filled 2022-05-24: qty 10

## 2022-05-24 MED ORDER — ONDANSETRON HCL 4 MG/2ML IJ SOLN
INTRAMUSCULAR | Status: DC | PRN
  Administered 2022-05-24: 4 mg via INTRAVENOUS

## 2022-05-24 MED ORDER — LIDOCAINE HCL (CARDIAC) PF 100 MG/5ML IV SOSY
PREFILLED_SYRINGE | INTRAVENOUS | Status: DC | PRN
  Administered 2022-05-24: 100 mg via INTRAVENOUS

## 2022-05-24 MED ORDER — METOPROLOL TARTRATE 5 MG/5ML IV SOLN
INTRAVENOUS | Status: AC
  Filled 2022-05-24: qty 5

## 2022-05-24 MED ORDER — PHENYLEPHRINE 80 MCG/ML (10ML) SYRINGE FOR IV PUSH (FOR BLOOD PRESSURE SUPPORT)
PREFILLED_SYRINGE | INTRAVENOUS | Status: DC | PRN
  Administered 2022-05-24: 160 ug via INTRAVENOUS
  Administered 2022-05-24: 240 ug via INTRAVENOUS
  Administered 2022-05-24 (×2): 80 ug via INTRAVENOUS
  Administered 2022-05-24: 160 ug via INTRAVENOUS
  Administered 2022-05-24: 80 ug via INTRAVENOUS

## 2022-05-24 MED ORDER — SODIUM CHLORIDE 0.9% IV SOLUTION: Freq: Once | INTRAVENOUS | Status: DC

## 2022-05-24 MED ORDER — ONDANSETRON HCL 4 MG/2ML IJ SOLN: 4.0000 mg | Freq: Once | INTRAMUSCULAR | Status: DC | PRN

## 2022-05-24 MED ORDER — ACETAMINOPHEN 10 MG/ML IV SOLN
INTRAVENOUS | Status: DC | PRN
  Administered 2022-05-24: 1000 mg via INTRAVENOUS

## 2022-05-24 MED ORDER — ACETAMINOPHEN 160 MG/5ML PO SOLN: 325.0000 mg | ORAL | Status: DC | PRN

## 2022-05-24 SURGICAL SUPPLY — 41 items
BLADE SAGITTAL WIDE XTHICK NO (BLADE) IMPLANT
BLADE SAW SAG 25.4X90 (BLADE) ×1 IMPLANT
BNDG COHESIVE 4X5 TAN STRL LF (GAUZE/BANDAGES/DRESSINGS) ×1 IMPLANT
BNDG ELASTIC 4X5.8 VLCR STR LF (GAUZE/BANDAGES/DRESSINGS) IMPLANT
BNDG ELASTIC 6X5.8 VLCR NS LF (GAUZE/BANDAGES/DRESSINGS) ×1 IMPLANT
BNDG GAUZE DERMACEA FLUFF 4 (GAUZE/BANDAGES/DRESSINGS) ×2 IMPLANT
BRUSH SCRUB EZ  4% CHG (MISCELLANEOUS) ×1
BRUSH SCRUB EZ 4% CHG (MISCELLANEOUS) ×1 IMPLANT
CHLORAPREP W/TINT 26 (MISCELLANEOUS) ×1 IMPLANT
DRAPE INCISE IOBAN 66X45 STRL (DRAPES) IMPLANT
ELECT CAUTERY BLADE 6.4 (BLADE) ×1 IMPLANT
ELECT REM PT RETURN 9FT ADLT (ELECTROSURGICAL) ×1
ELECTRODE REM PT RTRN 9FT ADLT (ELECTROSURGICAL) ×1 IMPLANT
GAUZE XEROFORM 1X8 LF (GAUZE/BANDAGES/DRESSINGS) ×2 IMPLANT
GLOVE BIO SURGEON STRL SZ7 (GLOVE) ×2 IMPLANT
GOWN STRL REUS W/ TWL LRG LVL3 (GOWN DISPOSABLE) ×2 IMPLANT
GOWN STRL REUS W/ TWL XL LVL3 (GOWN DISPOSABLE) ×1 IMPLANT
GOWN STRL REUS W/TWL LRG LVL3 (GOWN DISPOSABLE) ×2
GOWN STRL REUS W/TWL XL LVL3 (GOWN DISPOSABLE) ×1
HANDLE YANKAUER SUCT BULB TIP (MISCELLANEOUS) ×1 IMPLANT
KIT TURNOVER KIT A (KITS) ×1 IMPLANT
LABEL OR SOLS (LABEL) ×1 IMPLANT
MANIFOLD NEPTUNE II (INSTRUMENTS) ×1 IMPLANT
MAT ABSORB  FLUID 56X50 GRAY (MISCELLANEOUS) ×1
MAT ABSORB FLUID 56X50 GRAY (MISCELLANEOUS) ×1 IMPLANT
NS IRRIG 1000ML POUR BTL (IV SOLUTION) ×1 IMPLANT
PACK EXTREMITY ARMC (MISCELLANEOUS) ×1 IMPLANT
PAD ABD DERMACEA PRESS 5X9 (GAUZE/BANDAGES/DRESSINGS) ×2 IMPLANT
PAD PREP 24X41 OB/GYN DISP (PERSONAL CARE ITEMS) ×1 IMPLANT
SPONGE T-LAP 18X18 ~~LOC~~+RFID (SPONGE) ×1 IMPLANT
STAPLER SKIN PROX 35W (STAPLE) ×1 IMPLANT
STOCKINETTE M/LG 89821 (MISCELLANEOUS) ×1 IMPLANT
SUT SILK 2 0 (SUTURE) ×1
SUT SILK 2 0 SH (SUTURE) ×2 IMPLANT
SUT SILK 2-0 18XBRD TIE 12 (SUTURE) ×1 IMPLANT
SUT SILK 3 0 (SUTURE) ×1
SUT SILK 3-0 18XBRD TIE 12 (SUTURE) ×1 IMPLANT
SUT VIC AB 0 CT1 36 (SUTURE) ×2 IMPLANT
SUT VIC AB 2-0 CT1 (SUTURE) ×2 IMPLANT
TRAP FLUID SMOKE EVACUATOR (MISCELLANEOUS) ×1 IMPLANT
WATER STERILE IRR 500ML POUR (IV SOLUTION) ×1 IMPLANT

## 2022-05-24 NOTE — Interval H&P Note (Signed)
History and Physical Interval Note:  05/24/2022 12:44 PM  Alec A Caltagirone Jr.  has presented today for surgery, with the diagnosis of PAD Osteomyelitis.  The various methods of treatment have been discussed with the patient and family. After consideration of risks, benefits and other options for treatment, the patient has consented to  Procedure(s): AMPUTATION BELOW KNEE (Right) as a surgical intervention.  The patient's history has been reviewed, patient examined, no change in status, stable for surgery.  I have reviewed the patient's chart and labs.  Questions were answered to the patient's satisfaction.     [name]

## 2022-05-24 NOTE — Progress Notes (Signed)
PROGRESS NOTE    [name].   ITG:[id number] DOB: 1955/12/12  DOA: 05/18/2022 Date of Service: 05/24/22 PCP: [name], MD     Brief Narrative / Hospital Course:  This is a pleasant 67 year old gentleman with a history of COPD, chronic respiratory failure on home O2, CAD status post CABG, insulin-dependent type 2 diabetes, OSA on BiPAP at night, BPH hypertension, hypothyroidism and paroxysmal atrial fibrillation on Eliquis and amiodarone who has peripheral arterial disease status post left BKA and is admitted to the hospital with concern for right ankle osteomyelitis after an injury in December 2023.  He was seen in consultation by podiatry, who recommends continuation of empiric IV antibiotics, underwent angiogram and angioplasty of the right anterior tibial artery with vascular surgery on 01/22. After consideration, opted for R BKA 01/24 given low likelihood of healing with non-surgical management  Consultants:  Podiatry Vascular Surgery   Procedures: 05/21/22 RLE Angiogram with angioplasty  05/24/22 R below-knee amputation       ASSESSMENT & PLAN:   Principal Problem:   Non-healing wound of lower extremity, initial encounter Active Problems:   Acute osteomyelitis of right foot (HCC)   PAD (peripheral artery disease) (HCC)   S/P BKA (below knee amputation), left (HCC)   AKI (acute kidney injury) ([place])   Hyperkalemia   Chronic anticoagulation   AF (paroxysmal atrial fibrillation) ([place])   Uncontrolled type 2 diabetes mellitus with hypoglycemia, with long-term current use of insulin (HCC)   OSA (obstructive sleep apnea)   Chronic respiratory failure with hypoxia (HCC)   COPD (chronic obstructive pulmonary disease) (HCC)   Chronic diastolic CHF (congestive heart failure) (HCC)   CAD with Hx of CABG   Essential hypertension   BPH (benign prostatic hyperplasia)  Acute osteomyelitis of right foot and ankle (HCC) Nonhealing wound right ankle PAD s/p  left BKA Angiogram with angioplasty 1/22 with vascular surgery Continue empiric cefepime and vancomycin for now R BKA 05/24/22    Hyperkalemia - resolved Monitor BMP   AKI (acute kidney injury) ([place]) - resolved Monitor BMP   Pseudohyponatremia d/t hyperglucemia Sodium corrects to WNL today  Monitor BMP  Uncontrolled type 2 diabetes mellitus with hypoglycemia, with long-term current use of insulin (HCC) Blood sugar in the 300s on admission initially Continue home basal insulin Sliding scale coverage   AF (paroxysmal atrial fibrillation) (HCC) Chronic anticoagulation Continue amiodarone Restarted apixiban yesterday 01/23   COPD (chronic obstructive pulmonary disease) (HCC) OSA on home BiPAP qhs Chronic respiratory failure on home O2 Patient is at baseline Continue home Pulmicort and inhalers Continue BiPAP and O2 at night and anytime he is sleeping   CAD with Hx of CABG Continue aspirin, ezetimibe, Imdur, losartan   Chronic diastolic CHF (congestive heart failure) (HCC) Clinically euvolemic but did require 1500 mL IV fluids to maintain BP postoperatively, see below  holding Lasix, losartan given low BP --> restart ASAP   Postoperative hypotension Chronic Essential hypertension  Holding home antihypertensives for now given low BP after R BKA today Hypotension responded to 1500 mL fluids and MAP appropriate, closely monitor VS and fluid status back on the floor   BPH (benign prostatic hyperplasia) Continue finasteride and tamsulosin     DVT prophylaxis: lovenox  Pertinent IV fluids/nutrition: no continuous IV fluids  Central lines / invasive devices: none   Code Status: FULL CODE   Current Admission Status: inpatient   TOC needs / Dispo plan: none at this time Barriers to discharge / significant pending items: s/p  BKA today, PT/OT to eval tomorrow may need placement              Subjective / Brief ROS:  Patient examined in PACU. RN alerted me to low  BP, briefly diaphoretic, per RN anesthesia team attributed VS/symptoms to Fentanyl. Pt denies CP/SOB, pain is controlled.   Family Communication: none at this time     Objective Findings:  Vitals:   05/24/22 1610 05/24/22 1615 05/24/22 1630 05/24/22 1640  BP: (!) 91/52 (!) 83/53 (!) 94/56 (!) 98/58  Pulse: 81 82 86   Resp: '19 16 19   '$ Temp:    (!) 97.5 F (36.4 C)  TempSrc:      SpO2: 95% 98% 96%   Weight:      Height:        Intake/Output Summary (Last 24 hours) at 05/24/2022 1650 Last data filed at 05/24/2022 1600 Gross per 24 hour  Intake 2250 ml  Output 2625 ml  Net -375 ml   Filed Weights   05/18/22 2150  Weight: 135.5 kg    Examination:  Physical Exam Constitutional:      Appearance: Normal appearance. He is obese. He is not ill-appearing.  Cardiovascular:     Rate and Rhythm: Normal rate and regular rhythm.  Pulmonary:     Effort: Pulmonary effort is normal.     Breath sounds: Normal breath sounds.  Abdominal:     Palpations: Abdomen is soft.  Musculoskeletal:     Comments: LLE BKA, RLE BKA  Skin:    General: Skin is dry.  Neurological:     General: No focal deficit present.     Mental Status: He is alert and oriented to person, place, and time.  Psychiatric:        Behavior: Behavior normal.        Thought Content: Thought content normal.          Scheduled Medications:   sodium chloride   Intravenous Once   [MAR Hold] amiodarone  200 mg Oral Daily   [MAR Hold] ascorbic acid  500 mg Oral BID   [MAR Hold] aspirin EC  81 mg Oral Daily   [MAR Hold] budesonide  0.5 mg Nebulization BID   [MAR Hold] clopidogrel  75 mg Oral Daily   [MAR Hold] cyanocobalamin  5,000 mcg Oral Daily   [MAR Hold] enoxaparin (LOVENOX) injection  0.5 mg/kg Subcutaneous Q24H   [MAR Hold] ezetimibe  10 mg Oral Daily   [MAR Hold] ferrous sulfate  325 mg Oral Q breakfast   [MAR Hold] finasteride  5 mg Oral Daily   [MAR Hold] gabapentin  300 mg Oral QHS   [MAR Hold]  insulin aspart  0-15 Units Subcutaneous TID WC   [MAR Hold] insulin aspart  0-5 Units Subcutaneous QHS   [MAR Hold] insulin aspart  3 Units Subcutaneous TID WC   [MAR Hold] insulin glargine-yfgn  25 Units Subcutaneous QHS   ipratropium-albuterol       [MAR Hold] isosorbide mononitrate  60 mg Oral Daily   [MAR Hold] levothyroxine  25 mcg Oral Q0600   [MAR Hold] metroNIDAZOLE  500 mg Oral Q12H   [MAR Hold] multivitamin with minerals  1 tablet Oral Daily   [MAR Hold] nutrition supplement (JUVEN)  1 packet Oral BID BM   [MAR Hold] pramipexole  2 mg Oral QHS   [MAR Hold] primidone  250 mg Oral BID   [MAR Hold] Ensure Max Protein  11 oz Oral QHS   [MAR Hold]  risperiDONE  0.5 mg Oral BID   [MAR Hold] tamsulosin  0.4 mg Oral Daily   [MAR Hold] zinc sulfate  220 mg Oral Daily    Continuous Infusions:  sodium chloride 75 mL/hr at 05/23/22 2028   [facility] Hold] ceFEPime (MAXIPIME) IV 2 g (05/24/22 0723)   [MAR Hold] vancomycin 1,250 mg (05/23/22 2343)    PRN Medications:  acetaminophen **OR** acetaminophen (TYLENOL) oral liquid 160 mg/5 mL, [MAR Hold] acetaminophen **OR** [MAR Hold] acetaminophen, [MAR Hold] albuterol, diphenhydrAMINE, droperidol, famotidine, [MAR Hold] fentaNYL (SUBLIMAZE) injection, [MAR Hold] fentaNYL (SUBLIMAZE) injection, fentaNYL (SUBLIMAZE) injection, [MAR Hold] HYDROcodone-acetaminophen, [MAR Hold]  HYDROmorphone (DILAUDID) injection, [MAR Hold]  HYDROmorphone (DILAUDID) injection, ipratropium-albuterol, methylPREDNISolone (SOLU-MEDROL) injection, midazolam, [MAR Hold]  morphine injection, [MAR Hold] ondansetron **OR** [MAR Hold] ondansetron (ZOFRAN) IV, [MAR Hold] ondansetron (ZOFRAN) IV, ondansetron (ZOFRAN) IV, [MAR Hold] traZODone  Antimicrobials from admission:  Anti-infectives (From admission, onward)    Start     Dose/Rate Route Frequency Ordered Stop   05/24/22 1000  vancomycin (VANCOREADY) IVPB 1500 mg/300 mL        1,500 mg 150 mL/hr over 120 Minutes Intravenous  120 min pre-op 05/23/22 1830 05/24/22 1432   05/21/22 1655  ciprofloxacin (CIPRO) IVPB  Status:  Discontinued        over 60 Minutes  Continuous PRN 05/21/22 1655 05/21/22 1737   05/21/22 1515  ciprofloxacin (CIPRO) IVPB 400 mg  Status:  Discontinued        400 mg 200 mL/hr over 60 Minutes Intravenous 60 min pre-op 05/21/22 1515 05/21/22 1736   05/19/22 1200  [MAR Hold]  vancomycin (VANCOREADY) IVPB 1250 mg/250 mL        (MAR Hold since Thu 05/24/2022 at 1210.Hold Reason: Transfer to a Procedural area)   1,250 mg 166.7 mL/hr over 90 Minutes Intravenous Every 12 hours 05/19/22 0739     05/19/22 0100  vancomycin (VANCOREADY) IVPB 500 mg/100 mL       See Hyperspace for full Linked Orders Report.   500 mg 100 mL/hr over 60 Minutes Intravenous  Once 05/18/22 2209 05/19/22 0241   05/18/22 2300  vancomycin (VANCOREADY) IVPB 2000 mg/400 mL       See Hyperspace for full Linked Orders Report.   2,000 mg 200 mL/hr over 120 Minutes Intravenous  Once 05/18/22 2209 05/19/22 0138   05/18/22 2300  [MAR Hold]  ceFEPIme (MAXIPIME) 2 g in sodium chloride 0.9 % 100 mL IVPB        (MAR Hold since Thu 05/24/2022 at 1210.Hold Reason: Transfer to a Procedural area)   2 g 200 mL/hr over 30 Minutes Intravenous Every 8 hours 05/18/22 2209     05/18/22 2210  vancomycin variable dose per unstable renal function (pharmacist dosing)  Status:  Discontinued         Does not apply See admin instructions 05/18/22 2211 05/20/22 0908   05/18/22 2145  [MAR Hold]  metroNIDAZOLE (FLAGYL) tablet 500 mg        (MAR Hold since Thu 05/24/2022 at 1210.Hold Reason: Transfer to a Procedural area)   500 mg Oral Every 12 hours 05/18/22 2116 05/25/22 2144   05/18/22 2000  metroNIDAZOLE (FLAGYL) IVPB 500 mg  Status:  Discontinued        500 mg 100 mL/hr over 60 Minutes Intravenous  Once 05/18/22 1948 05/18/22 2144   05/18/22 2000  ceFEPIme (MAXIPIME) 2 g in sodium chloride 0.9 % 100 mL IVPB  Status:  Discontinued        2  g 200 mL/hr over  30 Minutes Intravenous  Once 05/18/22 1959 05/18/22 2209   05/18/22 2000  vancomycin (VANCOCIN) IVPB 1000 mg/200 mL premix  Status:  Discontinued        1,000 mg 200 mL/hr over 60 Minutes Intravenous  Once 05/18/22 1959 05/18/22 2209           Data Reviewed:  I have personally reviewed the following...  CBC: Recent Labs  Lab 05/18/22 1527 05/19/22 0531 05/20/22 0642 05/21/22 0431 05/22/22 0442 05/23/22 0417 05/24/22 0328 05/24/22 1250  WBC 11.8*   < > 13.2* 11.7* 11.3* 10.0 11.2*  --   NEUTROABS 9.9*  --   --   --   --   --   --   --   HGB 10.8*   < > 10.1* 9.9* 9.2* 10.1* 9.6* 9.9*  HCT 35.3*   < > 31.9* 31.6* 29.6* 32.6* 30.7* 31.4*  MCV 91.0   < > 89.1 89.0 89.7 90.6 89.5  --   PLT 345   < > 323 332 305 291 316  --    < > = values in this interval not displayed.   Basic Metabolic Panel: Recent Labs  Lab 05/20/22 0642 05/21/22 0431 05/22/22 0442 05/23/22 0417 05/24/22 0328  NA 133* 135 133* 135 132*  K 4.7 4.3 4.1 3.8 4.3  CL 101 101 100 104 101  CO2 '25 25 27 24 23  '$ GLUCOSE 101* 167* 185* 152* 257*  BUN 28* 33* 33* 39* 43*  CREATININE 0.90 0.99 0.93 1.03 1.00  CALCIUM 8.7* 9.1 8.8* 9.0 8.7*   GFR: Estimated Creatinine Clearance: 107.8 mL/min (by C-G formula based on SCr of 1 mg/dL). Liver Function Tests: Recent Labs  Lab 05/18/22 1527  AST 18  ALT 25  ALKPHOS 170*  BILITOT 0.3  PROT 7.4  ALBUMIN 2.6*   No results for input(s): "LIPASE", "AMYLASE" in the last 168 hours. No results for input(s): "AMMONIA" in the last 168 hours. Coagulation Profile: Recent Labs  Lab 05/18/22 1527  INR 1.3*   Cardiac Enzymes: No results for input(s): "CKTOTAL", "CKMB", "CKMBINDEX", "TROPONINI" in the last 168 hours. BNP (last 3 results) No results for input(s): "PROBNP" in the last 8760 hours. HbA1C: Recent Labs    05/22/22 0440  HGBA1C 11.2*   CBG: Recent Labs  Lab 05/24/22 0835 05/24/22 1147 05/24/22 1230 05/24/22 1450 05/24/22 1556  GLUCAP  207* 166* 152* 177* 194*   Lipid Profile: No results for input(s): "CHOL", "HDL", "LDLCALC", "TRIG", "CHOLHDL", "LDLDIRECT" in the last 72 hours. Thyroid Function Tests: No results for input(s): "TSH", "T4TOTAL", "FREET4", "T3FREE", "THYROIDAB" in the last 72 hours. Anemia Panel: No results for input(s): "VITAMINB12", "FOLATE", "FERRITIN", "TIBC", "IRON", "RETICCTPCT" in the last 72 hours. Most Recent Urinalysis On File:     Component Value Date/Time   COLORURINE YELLOW (A) 05/20/2022 0134   APPEARANCEUR CLEAR (A) 05/20/2022 0134   LABSPEC 1.013 05/20/2022 0134   PHURINE 6.0 05/20/2022 0134   GLUCOSEU 50 (A) 05/20/2022 0134   HGBUR NEGATIVE 05/20/2022 0134   BILIRUBINUR NEGATIVE 05/20/2022 0134   KETONESUR NEGATIVE 05/20/2022 0134   PROTEINUR 100 (A) 05/20/2022 0134   NITRITE NEGATIVE 05/20/2022 0134   LEUKOCYTESUR NEGATIVE 05/20/2022 0134   Sepsis Labs: '@LABRCNTIP'$ (procalcitonin:4,lacticidven:4) Microbiology: Recent Results (from the past 240 hour(s))  Culture, blood (Routine x 2)     Status: None   Collection Time: 05/18/22  3:27 PM   Specimen: BLOOD  Result Value Ref Range Status   Specimen Description  BLOOD RIGHT AC  Final   Special Requests   Final    BOTTLES DRAWN AEROBIC AND ANAEROBIC Blood Culture adequate volume   Culture   Final    NO GROWTH 5 DAYS Performed at [facility], [address] [place], [place] [address]    Report Status 05/23/2022 FINAL  Final  Culture, blood (Routine X 2) w Reflex to ID Panel     Status: None   Collection Time: 05/18/22 10:10 PM   Specimen: BLOOD  Result Value Ref Range Status   Specimen Description BLOOD BLOOD LEFT ARM  Final   Special Requests   Final    BOTTLES DRAWN AEROBIC AND ANAEROBIC Blood Culture adequate volume   Culture   Final    NO GROWTH 5 DAYS Performed at [facility], [address], [place], [place] [address]    Report Status 05/23/2022 FINAL  Final      Radiology Studies last 3  days: PERIPHERAL VASCULAR CATHETERIZATION  Result Date: 05/21/2022 See surgical note for result.            LOS: 6 days       Emeterio Reeve, DO [facility] 05/24/2022, 4:50 PM    Dictation software may have been used to generate the above note. Typos may occur and escape review in typed/dictated notes. Please contact Dr Sheppard Coil directly for clarity if needed.  Staff may message me via secure chat in [place]  but this may not receive an immediate response,  please page me for urgent matters!  If 7PM-7AM, please contact night coverage [email or web address]

## 2022-05-24 NOTE — Progress Notes (Addendum)
Bp dropped significantly and pt diaphoretic.  Pt had stated when asked shortly before about the diaphoresis he was hot and he sweats easy.  Cool cloth was placed on head but then called dr Lavone Neri to bedside to evaluate pt when bp dropped to 52  systolic.  Pt was placed in trendelenburg while waiting with NS open.  250 ml bolus being given per dr Lavone Neri.  Will contact anesthesia if bp does not respond to fluids.      1550- informed dr Erenest Rasher pt bp back in low 70 systolic.  Repeat bolus to be given as well as duoneb for expiratory wheezing and decreased sats.   1615- discussed pt with dr Sheppard Coil after talking with dr Erenest Rasher.  Dr Sheppard Coil to bedside.  Systolic in 88A after speaking with dr Sheppard Coil and her at bedside. As long as remains in 90s pt ok to go back to floor per dr Sheppard Coil.

## 2022-05-24 NOTE — Op Note (Signed)
OPERATIVE NOTE   PROCEDURE: Right below-the-knee amputation  PRE-OPERATIVE DIAGNOSIS: Right foot and ankle osteomyelitis  POST-OPERATIVE DIAGNOSIS: same as above  SURGEON: [name], MD  ASSISTANT(S): Annalee Genta, NP  ANESTHESIA: general  ESTIMATED BLOOD LOSS: 400 cc  FINDING(S): none  SPECIMEN(S):  Right below-the-knee amputation  INDICATIONS:   [name]. is a 67 y.o. male who presents with right leg gangrene.  The patient is scheduled for a right below-the-knee amputation.  I discussed in depth with the patient the risks, benefits, and alternatives to this procedure.  The patient is aware that the risk of this operation included but are not limited to:  bleeding, infection, myocardial infarction, stroke, death, failure to heal amputation wound, and possible need for more proximal amputation.  The patient is aware of the risks and agrees proceed forward with the procedure. An assistant was present during the procedure to help facilitate the exposure and expedite the procedure.   DESCRIPTION:  After full informed written consent was obtained from the patient, the patient was brought back to the operating room, and placed supine upon the operating table.  Prior to induction, the patient received IV antibiotics.  The patient was then prepped and draped in the standard fashion for a below-the-knee amputation.  After obtaining adequate anesthesia, the patient was prepped and draped in the standard fashion for a right below-the-knee amputation. The assistant provided retraction and mobilization to help facilitate exposure and expedite the procedure throughout the entire procedure.  This included following suture, using retractors, and optimizing lighting.  I marked out the anterior incision two finger breadths below the tibial tuberosity and then the marked out a posterior flap that was one third of the circumference of the calf in length.   I made the incisions for these flaps,  and then dissected through the subcutaneous tissue, fascia, and muscle anteriorly.  I elevated  the periosteal tissue superiorly so that the tibia was about 3-4 cm shorter than the anterior skin flap.  I then transected the tibia with a power saw and then took a wedge off the tibia anteriorly with the power saw.  Then I smoothed out the rough edges.  In a similar fashion, I cut back the fibula about two centimeters higher than the level of the tibia with a bone cutter.  I put a bone hook into the distal tibia and then used a large amputation knife to sharply develop a tissue plane through the muscle along the fibula.  In such fashion, the posterior flap was developed.  At this point, the specimen was passed off the field as the below-the-knee amputation.  At this point, I clamped all visibly bleeding arteries and veins using a combination of suture ligation with Silk suture and electrocautery.  Bleeding continued to be controlled with electrocautery and suture ligature.  The stump was washed off with sterile normal saline and no further active bleeding was noted.  I reapproximated the anterior and posterior fascia  with interrupted stitches of 0 Vicryl.  This was completed along the entire length of anterior and posterior fascia until there were no more loose space in the fascial line. I then placed a layer of 2-0 Vicryl sutures in the subcutaneous tissue. The skin was then  reapproximated with staples.  The stump was washed off and dried.  The incision was dressed with Xeroform and  then fluffs were applied.  Kerlix was wrapped around the leg and then gently an ACE wrap was applied.  COMPLICATIONS: none  CONDITION: stable   [name]  05/24/2022, 2:34 PM    This note was created with Dragon Medical transcription system. Any errors in dictation are purely unintentional.

## 2022-05-24 NOTE — Anesthesia Procedure Notes (Signed)
Procedure Name: Intubation Date/Time: 05/24/2022 12:58 PM  Performed by: Kelton Pillar, CRNAPre-anesthesia Checklist: Patient identified, Emergency Drugs available, Suction available and Patient being monitored Patient Re-evaluated:Patient Re-evaluated prior to induction Oxygen Delivery Method: Circle system utilized Preoxygenation: Pre-oxygenation with 100% oxygen Induction Type: IV induction Ventilation: Mask ventilation without difficulty Laryngoscope Size: McGraph and 4 Grade View: Grade I Tube type: Oral Tube size: 7.0 mm Number of attempts: 1 Airway Equipment and Method: Stylet and Oral airway Placement Confirmation: ETT inserted through vocal cords under direct vision, positive ETCO2, breath sounds checked- equal and bilateral and CO2 detector Secured at: 21 cm Tube secured with: Tape Dental Injury: Teeth and Oropharynx as per pre-operative assessment

## 2022-05-24 NOTE — Transfer of Care (Signed)
Immediate Anesthesia Transfer of Care Note  Patient: [name]  Procedure(s) Performed: AMPUTATION BELOW KNEE (Right: Knee)  Patient Location: PACU  Anesthesia Type:General  Level of Consciousness: awake, drowsy, and patient cooperative  Airway & Oxygen Therapy: Patient Spontanous Breathing and Patient connected to face mask oxygen  Post-op Assessment: Report given to RN and Post -op Vital signs reviewed and stable  Post vital signs: Reviewed and stable  Last Vitals:  Vitals Value Taken Time  BP 105/70 05/24/22 1436  Temp 36.2 C 05/24/22 1436  Pulse 109 05/24/22 1440  Resp 25 05/24/22 1440  SpO2 97 % 05/24/22 1440  Vitals shown include unvalidated device data.  Last Pain:  Vitals:   05/23/22 2000  TempSrc:   PainSc: 0-No pain         Complications: No notable events documented.

## 2022-05-24 NOTE — Plan of Care (Signed)
  Problem: Clinical Measurements: Goal: Will remain free from infection Outcome: Progressing   Problem: Activity: Goal: Risk for activity intolerance will decrease Outcome: Progressing   Problem: Skin Integrity: Goal: Risk for impaired skin integrity will decrease Outcome: Progressing   Problem: Tissue Perfusion: Goal: Adequacy of tissue perfusion will improve Outcome: Progressing

## 2022-05-24 NOTE — Progress Notes (Signed)
Nutrition Follow-up  DOCUMENTATION CODES:   Obesity unspecified  INTERVENTION:   Once is advanced, resume:   -Continue MVI with minerals daily -Continue 500 mg vitamin C BID -Continue 220 mg zinc sulfate daily x 14 days -Continue 1 packet Juven BID, each packet provides 95 calories, 2.5 grams of protein (collagen), and 9.8 grams of carbohydrate (3 grams sugar); also contains 7 grams of L-arginine and L-glutamine, 300 mg vitamin C, 15 mg vitamin E, 1.2 mcg vitamin B-12, 9.5 mg zinc, 200 mg calcium, and 1.5 g  Calcium Beta-hydroxy-Beta-methylbutyrate to support wound healing  -Continue double protein portions with meals -Continue Ensure Max po daily, each supplement provides 150 kcal and 30 grams of protein  NUTRITION DIAGNOSIS:   Increased nutrient needs related to wound healing as evidenced by estimated needs.  Ongoing  GOAL:   Patient will meet greater than or equal to 90% of their needs  Progressing   MONITOR:   PO intake, Supplement acceptance, Skin, Labs  REASON FOR ASSESSMENT:   Consult Wound healing  ASSESSMENT:   Pt with hx of CHF, COPD, CAD, DM type 2, gout, HLD, HTN, hx MI, PVD and left BKA presented to ED with worsening ankle wound. Found to have osteomyelitis.  1/22- s/p aortogram  Reviewed I/O's: -3 L x 24 hours and -15.6 L since admission  UOP: 3.8 L x 24 hours   Pt down in OR at time of visit. Per vascular surgery, plan for rt BKA today.   Pt with good oral intake. Noted meal completions 100%. Pt has been taking supplements.   TOC following for safe discharge disposition.   Medications reviewed and include vitamin C, vitamin B-12, ferrous sulfate, lasix, zinc sulfate, and 0.9% sodium chloride infusion @ 75 ml/hr.   Labs reviewed: Na: 132, CBGS: 152-166 (inpatient orders for glycemic control are 0-15 units insulin aspart TID with meals, 0-5 units insulin aspart daily at bedtime, 3 units insulin aspart with meals, and 25 units insulin glargine-yfgn  daily).    Diet Order:   Diet Order             Diet NPO time specified  Diet effective midnight                   EDUCATION NEEDS:   Education needs have been addressed  Skin:  Skin Assessment: Skin Integrity Issues: Skin Integrity Issues:: Stage III Stage III: rt ankle  Last BM:  05/23/22  Height:   Ht Readings from Last 1 Encounters:  05/21/22 '6\' 4"'$  (1.93 m)    Weight:   Wt Readings from Last 1 Encounters:  05/18/22 135.5 kg    Ideal Body Weight:  86.4 kg (adjusted by 5.9% for BKA)  BMI:  Body mass index is 36.37 kg/m.  Estimated Nutritional Needs:   Kcal:  2400-2600  Protein:  130-145 grams  Fluid:  > 2 L    Loistine Chance, RD, LDN, [place] Registered Dietitian II Certified Diabetes Care and Education Specialist Please refer to [facility] for RD and/or RD on-call/weekend/after hours pager

## 2022-05-24 NOTE — Anesthesia Preprocedure Evaluation (Signed)
Anesthesia Evaluation  Patient identified by MRN, date of birth, ID band Patient awake and Patient confused    Reviewed: Allergy & Precautions, NPO status , Patient's Chart, lab work & pertinent test results  History of Anesthesia Complications Negative for: history of anesthetic complications  Airway Mallampati: IV  TM Distance: >3 FB Neck ROM: full    Dental  (+) Chipped, Poor Dentition, Missing, Dental Advidsory Given Patient's lips are dry, cracked, and bleeding.:   Pulmonary asthma , sleep apnea , COPD,  COPD inhaler and oxygen dependent, former smoker   Pulmonary exam normal        Cardiovascular Exercise Tolerance: Good hypertension, + CAD, + Past MI, + Cardiac Stents, + CABG and + Peripheral Vascular Disease  Normal cardiovascular exam     Neuro/Psych  PSYCHIATRIC DISORDERS       Neuromuscular disease    GI/Hepatic negative GI ROS, Neg liver ROS,,,  Endo/Other  diabetesHypothyroidism    Renal/GU Renal disease     Musculoskeletal   Abdominal   Peds  Hematology  (+) Blood dyscrasia, anemia   Anesthesia Other Findings Past Medical History: No date: Arrhythmia     Comment:  atrial fibrillation No date: Asthma No date: CHF (congestive heart failure) (HCC) No date: COPD (chronic obstructive pulmonary disease) (HCC) No date: Coronary artery disease No date: Depression No date: Diabetes mellitus without complication (HCC) No date: Gout No date: History anabolic steroid use No date: Hyperlipidemia No date: Hypertension No date: Hypogonadism in male No date: Morbid obesity ([place]) No date: Myocardial infarction ([place]) No date: Peripheral vascular disease ([place]) No date: Perirectal abscess No date: Pleurisy No date: Sleep apnea     Comment:  CPAP at night, no oxygen No date: Varicella  Past Surgical History: No date: ABDOMINAL AORTIC ANEURYSM REPAIR 01/10/2021: ACHILLES TENDON SURGERY; Left     Comment:   Procedure: ACHILLES LENGTHENING/KIDNER;  Surgeon: Caroline More, DPM;  Location: [facility];  Service: Podiatry;                Laterality: Left; 02/10/2016: AMPUTATION TOE; Right     Comment:  Procedure: AMPUTATION TOE 3RD TOE;  Surgeon: Samara Deist, DPM;  Location: [facility];  Service: Podiatry;                Laterality: Right; 02/24/2020: AMPUTATION TOE; Left     Comment:  Procedure: AMPUTATION TOE;  Surgeon: Caroline More, DPM;              Location: [facility];  Service: Podiatry;  Laterality:               Left; 62/05/2977: APPLICATION OF WOUND VAC; Left     Comment:  Procedure: APPLICATION OF WOUND VAC;  Surgeon: Caroline More, DPM;  Location: [facility];  Service: Podiatry;                Laterality: Left; 11/18/2015: COLONOSCOPY WITH PROPOFOL; [name]     Comment:  Procedure: COLONOSCOPY WITH PROPOFOL;  Surgeon: Manya Silvas, MD;  Location: [facility];  Service:               Endoscopy;  Laterality: [name]; No date: CORONARY ARTERY BYPASS  GRAFT 02/02/2020: CORONARY STENT INTERVENTION; [name]     Comment:  Procedure: CORONARY STENT INTERVENTION;  Surgeon:               Isaias Cowman, MD;  Location: [place] [facility]               [facility];  Service: Cardiovascular;  Laterality: [name]; 08/07/2021: INCISION AND DRAINAGE; Left     Comment:  Procedure: INCISION AND DRAINAGE-Partial Calcanectomy;                Surgeon: Caroline More, DPM;  Location: [facility];                Service: Podiatry;  Laterality: Left; 02/29/2020: IRRIGATION AND DEBRIDEMENT FOOT; Left     Comment:  Procedure: IRRIGATION AND DEBRIDEMENT FOOT;  Surgeon:               Caroline More, DPM;  Location: [facility];  Service:               Podiatry;  Laterality: Left; 02/24/2020: IRRIGATION AND DEBRIDEMENT FOOT; Left     Comment:  Procedure: IRRIGATION AND DEBRIDEMENT FOOT;  Surgeon:               Caroline More, DPM;  Location: [facility];  Service:               Podiatry;   Laterality: Left; No date: KNEE ARTHROSCOPY 02/02/2020: LEFT HEART CATH AND CORS/GRAFTS ANGIOGRAPHY; [name]     Comment:  Procedure: LEFT HEART CATH AND CORS/GRAFTS ANGIOGRAPHY;               Surgeon: Teodoro Spray, MD;  Location: [place] [facility]              [facility];  Service: Cardiovascular;  Laterality: [name]; 02/25/2020: LOWER EXTREMITY ANGIOGRAPHY; Left     Comment:  Procedure: Lower Extremity Angiography;  Surgeon: Algernon Huxley, MD;  Location: [place];  Service:               Cardiovascular;  Laterality: Left; 01/04/2021: LOWER EXTREMITY ANGIOGRAPHY; Left     Comment:  Procedure: LOWER EXTREMITY ANGIOGRAPHY;  Surgeon: Algernon Huxley, MD;  Location: [place] [facility];  Service:               Cardiovascular;  Laterality: Left; 01/10/2021: METATARSAL HEAD EXCISION; Left     Comment:  Procedure: METATARSAL HEAD EXCISION - LEFT 5th;                Surgeon: Caroline More, DPM;  Location: [facility];                Service: Podiatry;  Laterality: Left; 01/24/2016: PERIPHERAL VASCULAR CATHETERIZATION; Right     Comment:  Procedure: Lower Extremity Angiography;  Surgeon:               Katha Cabal, MD;  Location: [place] [facility];                Service: Cardiovascular;  Laterality: Right; 01/25/2016: PERIPHERAL VASCULAR CATHETERIZATION; Right     Comment:  Procedure: Lower Extremity Angiography;  Surgeon:               Katha Cabal, MD;  Location: [place] [facility];  Service: Cardiovascular;  Laterality: Right; No date: TOE AMPUTATION No date: TONSILLECTOMY  BMI    Body Mass Index: 51.73 kg/m      Reproductive/Obstetrics negative OB ROS                             Anesthesia Physical Anesthesia Plan  ASA: 3  Anesthesia Plan: General ETT   Post-op Pain Management:    Induction: Intravenous  PONV Risk Score and Plan: Ondansetron, Dexamethasone, Midazolam and Treatment may vary due to age or  medical condition  Airway Management Planned: LMA  Additional Equipment:   Intra-op Plan:   Post-operative Plan: Extubation in OR  Informed Consent: I have reviewed the patients History and Physical, chart, labs and discussed the procedure including the risks, benefits and alternatives for the proposed anesthesia with the patient or authorized representative who has indicated his/her understanding and acceptance.     Dental Advisory Given  Plan Discussed with: Anesthesiologist, CRNA and Surgeon  Anesthesia Plan Comments: (Patient consented for risks of anesthesia including but not limited to:  - adverse reactions to medications - damage to eyes, teeth, lips or other oral mucosa - nerve damage due to positioning  - sore throat or hoarseness - Damage to heart, brain, nerves, lungs, other parts of body or loss of life  Patient voiced understanding.)        Anesthesia Quick Evaluation

## 2022-05-24 NOTE — Interval H&P Note (Signed)
History and Physical Interval Note:  05/24/2022 12:49 PM  Alec A Turton Jr.  has presented today for surgery, with the diagnosis of PAD Osteomyelitis.  The various methods of treatment have been discussed with the patient and family. After consideration of risks, benefits and other options for treatment, the patient has consented to  Procedure(s): AMPUTATION BELOW KNEE (Right) as a surgical intervention.  The patient's history has been reviewed, patient examined, no change in status, stable for surgery.  I have reviewed the patient's chart and labs.  Questions were answered to the patient's satisfaction.   After long discussion, patient has decided to proceed with right BKA.   [name]

## 2022-05-25 ENCOUNTER — Encounter: Payer: Self-pay | Admitting: Vascular Surgery

## 2022-05-25 DIAGNOSIS — S81809A Unspecified open wound, unspecified lower leg, initial encounter: Secondary | ICD-10-CM | POA: Diagnosis not present

## 2022-05-25 LAB — BASIC METABOLIC PANEL
Anion gap: 8 (ref 5–15)
BUN: 46 mg/dL — ABNORMAL HIGH (ref 8–23)
CO2: 22 mmol/L (ref 22–32)
Calcium: 8.7 mg/dL — ABNORMAL LOW (ref 8.9–10.3)
Chloride: 104 mmol/L (ref 98–111)
Creatinine, Ser: 0.99 mg/dL (ref 0.61–1.24)
GFR, Estimated: >60 mL/min (ref >60)
Glucose, Bld: 264 mg/dL — ABNORMAL HIGH (ref 70–99)
Potassium: 4.3 mmol/L (ref 3.5–5.1)
Sodium: 134 mmol/L — ABNORMAL LOW (ref 135–145)

## 2022-05-25 LAB — PREPARE RBC (CROSSMATCH)

## 2022-05-25 LAB — CBC
HCT: 26.7 % — ABNORMAL LOW (ref 39.0–52.0)
Hemoglobin: 8.2 g/dL — ABNORMAL LOW (ref 13.0–17.0)
MCH: 28 pg (ref 26.0–34.0)
MCHC: 30.7 g/dL (ref 30.0–36.0)
MCV: 91.1 fL (ref 80.0–100.0)
Platelets: 302 10*3/uL (ref 150–400)
RBC: 2.93 MIL/uL — ABNORMAL LOW (ref 4.22–5.81)
RDW: 14.5 % (ref 11.5–15.5)
WBC: 12.2 10*3/uL — ABNORMAL HIGH (ref 4.0–10.5)
nRBC: 0 % (ref 0.0–0.2)

## 2022-05-25 LAB — GLUCOSE, CAPILLARY
Glucose-Capillary: 220 mg/dL — ABNORMAL HIGH (ref 70–99)
Glucose-Capillary: 302 mg/dL — ABNORMAL HIGH (ref 70–99)
Glucose-Capillary: 305 mg/dL — ABNORMAL HIGH (ref 70–99)
Glucose-Capillary: 237 mg/dL — ABNORMAL HIGH (ref 70–99)

## 2022-05-25 NOTE — Plan of Care (Signed)
  Problem: Clinical Measurements: Goal: Ability to maintain clinical measurements within normal limits will improve Outcome: Progressing Goal: Will remain free from infection Outcome: Progressing Goal: Diagnostic test results will improve Outcome: Progressing Goal: Respiratory complications will improve Outcome: Progressing   Problem: Pain Managment: Goal: General experience of comfort will improve Outcome: Progressing   Problem: Safety: Goal: Ability to remain free from injury will improve Outcome: Progressing   Problem: Skin Integrity: Goal: Risk for impaired skin integrity will decrease Outcome: Progressing   Problem: Tissue Perfusion: Goal: Adequacy of tissue perfusion will improve Outcome: Progressing

## 2022-05-25 NOTE — Evaluation (Signed)
Occupational Therapy Evaluation Patient Details Name: [name]. MRN: [id number] DOB: 1955-09-05 Today's Date: 05/25/2022   History of Present Illness [name] is a 67 y/o M admitted on 05/18/22 with concern for right ankle osteomyelitis after an injury in December 2023. [name] was seen in consultation by podiatry, who recommended continuation of empiric IV antibiotics, underwent angiogram & angioplasty of the right anterior tibial artery with vascular surgery on 01/22. After consideration, opted for & underwent R BKA 01/25 given low likelihood of healing with non-surgical management. PMH: COPD, chronic respiratory failure on home O2, CAD s/p CABG, IDDM2, OSA on bipap at night, BPH HTN, hypothyroidism, paroxysmal a-fib on eliquis & amiodarone, PAD, L BKA      Clinical Impression   [name] was seen for OT evaluation this date. [name] familiar to this author from past hospitalizations. [name] reports he had recently returned home from [place] and was back to using his slide board and hoyer lift for functional transfers. His wife assists him with BADL management including sponge bathing at baseline. [name] presents to acute OT demonstrating impaired ADL performance and functional mobility 2/2 increased pain, decreased functional use of his RLE, and decreased activity tolerance 2/2 recent new R BKA (See OT problem list for additional info). [name] currently requires MAX A for LB ADL Management at bed level. He requires TOTAL A +2 to perform lateral scoot transfer from recliner back to bed.  [name] would benefit from skilled OT services to address noted impairments and functional limitations (see below for any additional details) in order to maximize safety and independence while minimizing falls risk and caregiver burden. Upon hospital discharge, recommend STR to maximize [name] safety and return to PLOF.        Recommendations for follow up therapy are one component of a multi-disciplinary discharge planning process, led by the  attending physician.  Recommendations may be updated based on patient status, additional functional criteria and insurance authorization.   Follow Up Recommendations  Skilled nursing-short term rehab (<3 hours/day)     Assistance Recommended at Discharge Frequent or constant Supervision/Assistance  Patient can return home with the following Two people to help with bathing/dressing/bathroom;Two people to help with walking and/or transfers;Help with stairs or ramp for entrance;Assistance with cooking/housework;Assist for transportation    Functional Status Assessment  Patient has had a recent decline in their functional status and demonstrates the ability to make significant improvements in function in a reasonable and predictable amount of time.  Equipment Recommendations  None recommended by OT    Recommendations for Other Services       Precautions / Restrictions Precautions Precautions: Fall Restrictions Weight Bearing Restrictions: Yes RLE Weight Bearing: Non weight bearing Other Position/Activity Restrictions: S/p R BKA, hx L BKA      Mobility Bed Mobility Overal bed mobility: Needs Assistance Bed Mobility: Rolling, Sit to Supine Rolling: Min assist     Sit to supine: Max assist, +2 for physical assistance        Transfers Overall transfer level: Needs assistance   Transfers: Bed to chair/wheelchair/BSC            Lateral/Scoot Transfers: +2 physical assistance, Total assist        Balance Overall balance assessment: Needs assistance Sitting-balance support: Feet supported, Bilateral upper extremity supported Sitting balance-Leahy Scale: Poor  ADL either performed or assessed with clinical judgement   ADL Overall ADL's : Needs assistance/impaired                                       General ADL Comments: Significantly functionally limited by generalized weakness, increased pain and  decreased functional use of his RLE. [name] requires MAX A +2 to transfer from recliner back to bed with TOTAL A to reposition in bed. MIN A for rolling R<>L. Anticipate MAX A for LB ADL mangement with sitting lateral leans. MAX A +2 for toilet transfer and toileting.     Vision Patient Visual Report: No change from baseline       Perception     Praxis      Pertinent Vitals/Pain Pain Assessment Pain Assessment: 0-10 Pain Score: 5  Pain Location: R residual limb Pain Descriptors / Indicators: Discomfort Pain Intervention(s): Monitored during session, Limited activity within patient's tolerance, Premedicated before session, Repositioned     Hand Dominance Right   Extremity/Trunk Assessment Upper Extremity Assessment Upper Extremity Assessment: Generalized weakness   Lower Extremity Assessment Lower Extremity Assessment: Generalized weakness;RLE deficits/detail RLE Deficits / Details: RLE bandages clean, dry, intact, t/o sesion. Tends to favor R knee flexion.       Communication Communication Communication: No difficulties   Cognition Arousal/Alertness: Awake/alert Behavior During Therapy: WFL for tasks assessed/performed Overall Cognitive Status: Within Functional Limits for tasks assessed                                 General Comments: Fatigued, but willing to participate, follows VCs consistently during session.     General Comments       Exercises Other Exercises Other Exercises: [name] educated on transfer technique, safety with lateral scoots, falls prevention, and residual limb management.   Shoulder Instructions      Home Living Family/patient expects to be discharged to:: Private residence Living Arrangements: Spouse/significant other Available Help at Discharge: Family;Available 24 hours/day Type of Home: House Home Access: Ramped entrance     Home Layout: One level     Bathroom Shower/Tub: Tub/shower unit;Sponge bathes at baseline    Bathroom Toilet: Handicapped height     Home Equipment: Conservation officer, nature (2 wheels);Cane - quad;Shower seat - built Hotel manager: Tax inspector Additional Comments: Bariatric RW; knee scooter, hoyer, slide board      Prior Functioning/Environment Prior Level of Function : Needs assist       Physical Assist : Mobility (physical);ADLs (physical) Mobility (physical): Bed mobility;Transfers ADLs (physical): IADLs;Bathing Mobility Comments: [name] uses slide board to transfer OOB to w/c 2/2 decline in height, uses hoyer lift to transfer back to bed. Was receiving HHPT. ADLs Comments: Wife assists in ADL's/IADL's. Reports donning socks with sock aid and working to be as independent as possible.        OT Problem List: Decreased strength;Decreased coordination;Pain;Decreased range of motion;Decreased activity tolerance;Decreased safety awareness;Impaired balance (sitting and/or standing);Decreased knowledge of use of DME or AE;Decreased knowledge of precautions      OT Treatment/Interventions: Self-care/ADL training;Therapeutic exercise;Therapeutic activities;Energy conservation;DME and/or AE instruction;Patient/family education;Balance training    OT Goals(Current goals can be found in the care plan section) Acute Rehab OT Goals Patient Stated Goal: to get stronger OT Goal Formulation: With patient Time For Goal Achievement: 06/08/22 Potential to Achieve Goals: Good ADL Goals  [name] Will Perform Grooming: sitting;with set-up;with supervision [name] Will Perform Lower Body Dressing: sitting/lateral leans;with min assist [name] Will Transfer to Toilet: bedside commode;anterior/posterior transfer;with transfer board;with min assist [name] Will Perform Toileting - Clothing Manipulation and hygiene: sitting/lateral leans;with min assist;with adaptive equipment  OT Frequency: Min 2X/week    Co-evaluation              AM-PAC OT "6 Clicks" Daily Activity     Outcome  Measure Help from another person eating meals?: None Help from another person taking care of personal grooming?: A Little Help from another person toileting, which includes using toliet, bedpan, or urinal?: A Lot Help from another person bathing (including washing, rinsing, drying)?: A Lot Help from another person to put on and taking off regular upper body clothing?: A Little Help from another person to put on and taking off regular lower body clothing?: A Lot 6 Click Score: 16   End of Session    Activity Tolerance: Patient tolerated treatment well Patient left: in bed;with call bell/phone within reach;Other (comment) (With NT's present in room to assist [name] with clean up once back to bed.)  OT Visit Diagnosis: Other abnormalities of gait and mobility (R26.89);Pain Pain - Right/Left: Right Pain - part of body: Knee;Leg                Time: 6720-9470 OT Time Calculation (min): 24 min Charges:  OT General Charges $OT Visit: 1 Visit OT Evaluation $OT Eval Moderate Complexity: 1 Mod OT Treatments $Self Care/Home Management : 8-22 mins  Shara Blazing, M.S., OTR/L 05/25/22, 1:59 PM

## 2022-05-25 NOTE — Evaluation (Signed)
Physical Therapy Evaluation Patient Details Name: [name]. MRN: [id number] DOB: July 17, 1955 Today's Date: 05/25/2022  History of Present Illness  Pt is a 67 y/o M admitted on 05/18/22 with concern for right ankle osteomyelitis after an injury in December 2023. Pt was seen in consultation by podiatry, who recommended continuation of empiric IV antibiotics, underwent angiogram & angioplasty of the right anterior tibial artery with vascular surgery on 01/22. After consideration, opted for & underwent R BKA 01/25 given low likelihood of healing with non-surgical management. PMH: COPD, chronic respiratory failure on home O2, CAD s/p CABG, IDDM2, OSA on bipap at night, BPH HTN, hypothyroidism, paroxysmal a-fib on eliquis & amiodarone, PAD, L BKA  Clinical Impression  Pt seen for PT evaluation with pt agreeable. Pt reports prior to admission he was completing slide board transfers bed>w/c, but used hoyer lift to transfer w/c>Bed 2/2 height differences, with wife's assistance. On this date, pt requires mod assist to transition semi fowler to long sitting & max assist to complete A>P transfer bed>recliner at equal height surfaces. PT educates pt on head/hips relationship & lateral lean to allow increased ease of scooting with pt demonstrating very good ability to do so, taking rest breaks PRN. Pt maintains R knee flexion throughout & PT educated pt on use of pillows to elevate RLE PRN but to not promote further hip extension to prevent further muscle tightness. Pt agreeable to STR upon d/c. Will continue to follow pt acutely to address balance, transfers, bed mobility, & w/c mobility.    Recommendations for follow up therapy are one component of a multi-disciplinary discharge planning process, led by the attending physician.  Recommendations may be updated based on patient status, additional functional criteria and insurance authorization.  Follow Up Recommendations Skilled nursing-short term rehab (<3  hours/day) Can patient physically be transported by private vehicle: No    Assistance Recommended at Discharge Frequent or constant Supervision/Assistance  Patient can return home with the following  A lot of help with walking and/or transfers;A lot of help with bathing/dressing/bathroom;Assist for transportation;Help with stairs or ramp for entrance;Direct supervision/assist for financial management;Assistance with cooking/housework    Equipment Recommendations None recommended by PT (TBD in next venue)  Recommendations for Other Services  OT consult    Functional Status Assessment Patient has had a recent decline in their functional status and demonstrates the ability to make significant improvements in function in a reasonable and predictable amount of time.     Precautions / Restrictions Precautions Precautions: Fall Restrictions Weight Bearing Restrictions: Yes RLE Weight Bearing: Non weight bearing      Mobility  Bed Mobility Overal bed mobility: Needs Assistance             General bed mobility comments: supine>long sitting with HOB elevated, mod assist, pt pulling to sitting holding PT's hand    Transfers Overall transfer level: Needs assistance   Transfers: Bed to chair/wheelchair/BSC         Anterior-Posterior transfers: Max assist   General transfer comment: extra time, rest breaks PRN, cuing for head/hips relationship & lateral scoots to shift one side of pelvis back at a time    Ambulation/Gait                  Stairs            Wheelchair Mobility    Modified Rankin (Stroke Patients Only)       Balance Overall balance assessment: Needs assistance Sitting-balance support: Feet supported, Bilateral upper  extremity supported Sitting balance-Leahy Scale: Poor                                       Pertinent Vitals/Pain Pain Assessment Pain Assessment: Faces Faces Pain Scale: Hurts even more Pain Location: R  residual limb Pain Descriptors / Indicators: Discomfort Pain Intervention(s): Monitored during session, RN gave pain meds during session    [place] expects to be discharged to:: Private residence Living Arrangements: Spouse/significant other Available Help at Discharge: Family;Available 24 hours/day Type of Home: House Home Access: Ramped entrance       Home Layout: One level Home Equipment: Conservation officer, nature (2 wheels);Cane - quad;Shower seat - built Academic librarian Comments: Bariatric RW; knee scooter, hoyer, slide board    Prior Function Prior Level of Function : Needs assist             Mobility Comments: Pt uses slide board to transfer OOB to w/c 2/2 decline in height, uses hoyer lift to transfer back to bed. Was receiving HHPT.       Hand Dominance        Extremity/Trunk Assessment   Upper Extremity Assessment Upper Extremity Assessment: Generalized weakness    Lower Extremity Assessment Lower Extremity Assessment: Generalized weakness;RLE deficits/detail RLE Deficits / Details: wrapped s/p sx, maintains R knee flexion throughout       Communication   Communication: No difficulties  Cognition Arousal/Alertness: Awake/alert Behavior During Therapy: WFL for tasks assessed/performed Overall Cognitive Status: Within Functional Limits for tasks assessed                                 General Comments: Good ability to follow instructions throughout session.        General Comments      Exercises     Assessment/Plan    PT Assessment Patient needs continued PT services  PT Problem List Decreased strength;Pain;Cardiopulmonary status limiting activity;Decreased activity tolerance;Decreased range of motion;Decreased knowledge of use of DME;Decreased balance;Decreased safety awareness;Decreased mobility;Decreased knowledge of precautions;Decreased skin integrity       PT Treatment Interventions DME  instruction;Therapeutic exercise;Wheelchair mobility training;Balance training;Neuromuscular re-education;Functional mobility training;Patient/family education;Therapeutic activities;Manual techniques;Modalities    PT Goals (Current goals can be found in the Care Plan section)  Acute Rehab PT Goals Patient Stated Goal: decreased pain, get better PT Goal Formulation: With patient Time For Goal Achievement: 06/08/22 Potential to Achieve Goals: Good Additional Goals Additional Goal #1: Pt will propel w/c x 150 ft with mod I to increase independence with functional mobility.    Frequency 7X/week     Co-evaluation               AM-PAC PT "6 Clicks" Mobility  Outcome Measure Help needed turning from your back to your side while in a flat bed without using bedrails?: A Lot Help needed moving from lying on your back to sitting on the side of a flat bed without using bedrails?: A Lot Help needed moving to and from a bed to a chair (including a wheelchair)?: Total Help needed standing up from a chair using your arms (e.g., wheelchair or bedside chair)?: Total Help needed to walk in hospital room?: Total Help needed climbing 3-5 steps with a railing? : Total 6 Click Score: 8    End of Session   Activity Tolerance: Patient tolerated treatment well;Patient  limited by fatigue Patient left: in chair;with nursing/sitter in room Nurse Communication: Mobility status PT Visit Diagnosis: Muscle weakness (generalized) (M62.81);Other abnormalities of gait and mobility (R26.89);Pain Pain - Right/Left: Right Pain - part of body: Leg    Time: 5051-8335 PT Time Calculation (min) (ACUTE ONLY): 19 min   Charges:   PT Evaluation $PT Eval Moderate Complexity: 1 Mod PT Treatments $Therapeutic Activity: 8-22 mins        Lavone Nian, PT, DPT 05/25/22, 10:03 AM   [name] 05/25/2022, 10:01 AM

## 2022-05-25 NOTE — Progress Notes (Signed)
[facility]  Daily Progress Note   Subjective  -   Patient is postop day 1 following a right below-knee amputation.  Several months ago he also underwent a left below-knee amputation.  He notes that he has been up out of bed to the chair with physical therapy today.  He endorses having good pain control with prescribed medications.  Objective Vitals:   05/25/22 0413 05/25/22 0718 05/25/22 0747 05/25/22 1625  BP: 115/60  107/66 (!) 115/56  Pulse: 87  91 94  Resp: '20  17 18  '$ Temp: 98.6 F (37 C)  99.1 F (37.3 C) 98.7 F (37.1 C)  TempSrc:      SpO2: 93% 98% 96% 97%  Weight:      Height:        Intake/Output Summary (Last 24 hours) at 05/25/2022 2241 Last data filed at 05/25/2022 1834 Gross per 24 hour  Intake --  Output 1500 ml  Net -1500 ml    PULM  CTAB CV  RRR VASC  bilateral below-knee amputation, scant drainage noted on right below-knee amputation dressing site  Laboratory CBC    Component Value Date/Time   WBC 12.2 (H) 05/25/2022 0446   HGB 8.2 (L) 05/25/2022 0446   HGB 10.7 (L) 05/15/2022 1440   HCT 26.7 (L) 05/25/2022 0446   HCT 33.4 (L) 05/15/2022 1440   PLT 302 05/25/2022 0446   PLT 394 05/15/2022 1440    BMET    Component Value Date/Time   NA 134 (L) 05/25/2022 0446   NA 141 07/12/2011 0919   K 4.3 05/25/2022 0446   K 4.4 07/12/2011 0919   CL 104 05/25/2022 0446   CL 102 07/12/2011 0919   CO2 22 05/25/2022 0446   CO2 29 07/12/2011 0919   GLUCOSE 264 (H) 05/25/2022 0446   GLUCOSE 76 07/12/2011 0919   BUN 46 (H) 05/25/2022 0446   BUN 16 05/05/2013 1022   CREATININE 0.99 05/25/2022 0446   CREATININE 0.71 05/05/2013 1022   CALCIUM 8.7 (L) 05/25/2022 0446   CALCIUM 8.8 07/12/2011 0919   GFRNONAA >60 05/25/2022 0446   GFRNONAA >60 05/05/2013 1022   GFRAA >60 01/06/2020 1853   GFRAA >60 05/05/2013 1022    Assessment/Planning: POD #1 s/p R BKA  Patient notes that his pain is well-controlled with current medical  regimen, continue with as needed use of pain medication Patient to continue to work with physical therapy, patient expresses interest in going to rehab at discharge.  I think this is a very good idea as he previously did not go to rehab and has definitely noticed some significant weakness and difficulty with transfers.  I believe that rehab will allow him to have a better quality of life once he is able to return home. Scant amount of shadowing drainage on postoperative dressing, no significant bleeding noted.  Will plan on changing this on postop day 3 or 4 Reinforced the importance of keeping his knee straight to avoid contractures.  Kris Hartmann  05/25/2022, 10:41 PM

## 2022-05-25 NOTE — Inpatient Diabetes Management (Signed)
Inpatient Diabetes Program Recommendations  AACE/ADA: New Consensus Statement on Inpatient Glycemic Control (2015)  Target Ranges:  Prepandial:   less than 140 mg/dL      Peak postprandial:   less than 180 mg/dL (1-2 hours)      Critically ill patients:  140 - 180 mg/dL   Lab Results  Component Value Date   GLUCAP 302 (H) 05/25/2022   HGBA1C 11.2 (H) 05/22/2022    Latest Reference Range & Units 05/24/22 08:35 05/24/22 11:47 05/24/22 12:30 05/24/22 14:50 05/24/22 15:56 05/24/22 17:41 05/24/22 20:44 05/25/22 08:09 05/25/22 11:30  Glucose-Capillary 70 - 99 mg/dL 207 (H) 166 (H) 152 (H) 177 (H) 194 (H) 197 (H) 325 (H) 220 (H) 302 (H)  (H): Data is abnormally high  Diabetes history: DM2 Outpatient Diabetes medications:  Semglee 40 QAM Novolog 15 units TID if >200 mg/dL Current orders for Inpatient glycemic control:  Semglee 25 units QHS Novolog 3 units tid meal coverage Novolog 0-15 units tid + 0-5 units hs  Inpatient Diabetes Program Recommendations:   Please consider: -Increase Semglee to 30 units -Increase Novolog meal coverage to 8 units tid if eats 50% meals  Thank you, Nani Gasser. Orel Cooler, RN, MSN, CDE  Diabetes Coordinator Inpatient Glycemic Control Team Team Pager [phone] (8am-5pm) 05/25/2022 12:37 PM

## 2022-05-25 NOTE — Progress Notes (Signed)
PROGRESS NOTE    [name].   OEU:[id number] DOB: Nov 07, 1955  DOA: 05/18/2022 Date of Service: 05/25/22 PCP: [name], MD     Brief Narrative / Hospital Course:  This is a pleasant 67 year old gentleman with a history of COPD, chronic respiratory failure on home O2, CAD status post CABG, insulin-dependent type 2 diabetes, OSA on BiPAP at night, BPH hypertension, hypothyroidism and paroxysmal atrial fibrillation on Eliquis and amiodarone who has peripheral arterial disease status post left BKA and is admitted to the hospital with concern for right ankle osteomyelitis after an injury in December 2023.  He was seen in consultation by podiatry, who recommends continuation of empiric IV antibiotics, underwent angiogram and angioplasty of the right anterior tibial artery with vascular surgery on 01/22. After consideration, opted for R BKA 01/24 given low likelihood of healing with non-surgical management. Awaiting SNF placement. Stopped abx 01/26  Consultants:  Podiatry Vascular Surgery   Procedures: 05/21/22 RLE Angiogram with angioplasty  05/24/22 R below-knee amputation       ASSESSMENT & PLAN:   Principal Problem:   Non-healing wound of lower extremity, initial encounter Active Problems:   Acute osteomyelitis of right foot (HCC)   PAD (peripheral artery disease) (HCC)   S/P BKA (below knee amputation), left (HCC)   AKI (acute kidney injury) ([place])   Hyperkalemia   Chronic anticoagulation   AF (paroxysmal atrial fibrillation) ([place])   Uncontrolled type 2 diabetes mellitus with hypoglycemia, with long-term current use of insulin (HCC)   OSA (obstructive sleep apnea)   Chronic respiratory failure with hypoxia (HCC)   COPD (chronic obstructive pulmonary disease) (HCC)   Chronic diastolic CHF (congestive heart failure) (HCC)   CAD with Hx of CABG   Essential hypertension   BPH (benign prostatic hyperplasia)  Acute osteomyelitis of right foot and ankle  (HCC) Nonhealing wound right ankle PAD s/p left BKA S/p angiogram with angioplasty 1/22 with vascular surgery S/p R BKA 05/24/22  D/c cefepime and vancomycin 05/25/22  SNF placement pending  Hyperkalemia - resolved Monitor BMP   AKI (acute kidney injury) ([place]) - resolved Monitor BMP   Pseudohyponatremia d/t hyperglucemia Sodium corrects to WNL today  Monitor BMP  Uncontrolled type 2 diabetes mellitus with hypoglycemia, with long-term current use of insulin (HCC) Blood sugar in the 300s on admission initially Continue home basal insulin Sliding scale coverage   AF (paroxysmal atrial fibrillation) (HCC) Chronic anticoagulation Continue amiodarone Restarted apixiban yesterday 01/23   COPD (chronic obstructive pulmonary disease) (HCC) OSA on home BiPAP qhs Chronic respiratory failure on home O2 Patient is at baseline Continue home Pulmicort and inhalers Continue BiPAP and O2 at night and anytime he is sleeping   CAD with Hx of CABG Continue aspirin, ezetimibe, Imdur, losartan   Chronic diastolic CHF (congestive heart failure) (HCC) Clinically euvolemic but did require 1500 mL IV fluids to maintain BP postoperatively holding Lasix, losartan given low BP --> restart when able   Postoperative hypotension - resolved Chronic Essential hypertension  Holding home antihypertensives for now given low BP after R BKA yesterday Hypotension responded to 1500 mL fluids and MAP appropriate, closely monitor VS and fluid status back on the floor   BPH (benign prostatic hyperplasia) Continue finasteride and tamsulosin     DVT prophylaxis: lovenox  Pertinent IV fluids/nutrition: no continuous IV fluids  Central lines / invasive devices: none   Code Status: FULL CODE   Current Admission Status: inpatient   TOC needs / Dispo plan: will need SNF  rehab Barriers to discharge / significant pending items: s/p BKA yesterday, wil need SNF rehab             Subjective / Brief  ROS:  Pt feeling okay today, leg stump is sore but pain is controlled   Family Communication: none at this time     Objective Findings:  Vitals:   05/24/22 2310 05/25/22 0413 05/25/22 0718 05/25/22 0747  BP: (!) 107/58 115/60  107/66  Pulse: 81 87  91  Resp: '20 20  17  '$ Temp: 98 F (36.7 C) 98.6 F (37 C)  99.1 F (37.3 C)  TempSrc:      SpO2: 93% 93% 98% 96%  Weight:      Height:        Intake/Output Summary (Last 24 hours) at 05/25/2022 1545 Last data filed at 05/25/2022 1100 Gross per 24 hour  Intake 500 ml  Output 1300 ml  Net -800 ml   Filed Weights   05/18/22 2150  Weight: 135.5 kg    Examination:  Physical Exam Constitutional:      Appearance: Normal appearance. He is obese. He is not ill-appearing.  Cardiovascular:     Rate and Rhythm: Normal rate and regular rhythm.  Pulmonary:     Effort: Pulmonary effort is normal.     Breath sounds: Normal breath sounds.  Abdominal:     Palpations: Abdomen is soft.  Musculoskeletal:     Comments: LLE BKA, RLE BKA  Skin:    General: Skin is dry.  Neurological:     General: No focal deficit present.     Mental Status: He is alert and oriented to person, place, and time.  Psychiatric:        Behavior: Behavior normal.        Thought Content: Thought content normal.          Scheduled Medications:   amiodarone  200 mg Oral Daily   ascorbic acid  500 mg Oral BID   aspirin EC  81 mg Oral Daily   budesonide  0.5 mg Nebulization BID   clopidogrel  75 mg Oral Daily   cyanocobalamin  5,000 mcg Oral Daily   enoxaparin (LOVENOX) injection  0.5 mg/kg Subcutaneous Q24H   ezetimibe  10 mg Oral Daily   ferrous sulfate  325 mg Oral Q breakfast   finasteride  5 mg Oral Daily   gabapentin  300 mg Oral QHS   insulin aspart  0-15 Units Subcutaneous TID WC   insulin aspart  0-5 Units Subcutaneous QHS   insulin aspart  3 Units Subcutaneous TID WC   insulin glargine-yfgn  25 Units Subcutaneous QHS   isosorbide  mononitrate  60 mg Oral Daily   levothyroxine  25 mcg Oral Q0600   metroNIDAZOLE  500 mg Oral Q12H   multivitamin with minerals  1 tablet Oral Daily   nutrition supplement (JUVEN)  1 packet Oral BID BM   pramipexole  2 mg Oral QHS   primidone  250 mg Oral BID   Ensure Max Protein  11 oz Oral QHS   risperiDONE  0.5 mg Oral BID   tamsulosin  0.4 mg Oral Daily   zinc sulfate  220 mg Oral Daily    Continuous Infusions:    PRN Medications:  acetaminophen **OR** acetaminophen, albuterol, fentaNYL (SUBLIMAZE) injection, fentaNYL (SUBLIMAZE) injection, HYDROcodone-acetaminophen, HYDROmorphone (DILAUDID) injection, HYDROmorphone (DILAUDID) injection, morphine injection, ondansetron **OR** ondansetron (ZOFRAN) IV, ondansetron (ZOFRAN) IV, traZODone  Antimicrobials from admission:  Anti-infectives (From admission,  onward)    Start     Dose/Rate Route Frequency Ordered Stop   05/24/22 1000  vancomycin (VANCOREADY) IVPB 1500 mg/300 mL        1,500 mg 150 mL/hr over 120 Minutes Intravenous 120 min pre-op 05/23/22 1830 05/24/22 1432   05/21/22 1655  ciprofloxacin (CIPRO) IVPB  Status:  Discontinued        over 60 Minutes  Continuous PRN 05/21/22 1655 05/21/22 1737   05/21/22 1515  ciprofloxacin (CIPRO) IVPB 400 mg  Status:  Discontinued        400 mg 200 mL/hr over 60 Minutes Intravenous 60 min pre-op 05/21/22 1515 05/21/22 1736   05/19/22 1200  vancomycin (VANCOREADY) IVPB 1250 mg/250 mL  Status:  Discontinued        1,250 mg 166.7 mL/hr over 90 Minutes Intravenous Every 12 hours 05/19/22 0739 05/25/22 1511   05/19/22 0100  vancomycin (VANCOREADY) IVPB 500 mg/100 mL       See Hyperspace for full Linked Orders Report.   500 mg 100 mL/hr over 60 Minutes Intravenous  Once 05/18/22 2209 05/19/22 0241   05/18/22 2300  vancomycin (VANCOREADY) IVPB 2000 mg/400 mL       See Hyperspace for full Linked Orders Report.   2,000 mg 200 mL/hr over 120 Minutes Intravenous  Once 05/18/22 2209 05/19/22  0138   05/18/22 2300  ceFEPIme (MAXIPIME) 2 g in sodium chloride 0.9 % 100 mL IVPB  Status:  Discontinued        2 g 200 mL/hr over 30 Minutes Intravenous Every 8 hours 05/18/22 2209 05/25/22 1511   05/18/22 2210  vancomycin variable dose per unstable renal function (pharmacist dosing)  Status:  Discontinued         Does not apply See admin instructions 05/18/22 2211 05/20/22 0908   05/18/22 2145  metroNIDAZOLE (FLAGYL) tablet 500 mg        500 mg Oral Every 12 hours 05/18/22 2116 05/25/22 2144   05/18/22 2000  metroNIDAZOLE (FLAGYL) IVPB 500 mg  Status:  Discontinued        500 mg 100 mL/hr over 60 Minutes Intravenous  Once 05/18/22 1948 05/18/22 2144   05/18/22 2000  ceFEPIme (MAXIPIME) 2 g in sodium chloride 0.9 % 100 mL IVPB  Status:  Discontinued        2 g 200 mL/hr over 30 Minutes Intravenous  Once 05/18/22 1959 05/18/22 2209   05/18/22 2000  vancomycin (VANCOCIN) IVPB 1000 mg/200 mL premix  Status:  Discontinued        1,000 mg 200 mL/hr over 60 Minutes Intravenous  Once 05/18/22 1959 05/18/22 2209           Data Reviewed:  I have personally reviewed the following...  CBC: Recent Labs  Lab 05/21/22 0431 05/22/22 0442 05/23/22 0417 05/24/22 0328 05/24/22 1250 05/25/22 0446  WBC 11.7* 11.3* 10.0 11.2*  --  12.2*  HGB 9.9* 9.2* 10.1* 9.6* 9.9* 8.2*  HCT 31.6* 29.6* 32.6* 30.7* 31.4* 26.7*  MCV 89.0 89.7 90.6 89.5  --  91.1  PLT 332 305 291 316  --  161   Basic Metabolic Panel: Recent Labs  Lab 05/21/22 0431 05/22/22 0442 05/23/22 0417 05/24/22 0328 05/25/22 0446  NA 135 133* 135 132* 134*  K 4.3 4.1 3.8 4.3 4.3  CL 101 100 104 101 104  CO2 '25 27 24 23 22  '$ GLUCOSE 167* 185* 152* 257* 264*  BUN 33* 33* 39* 43* 46*  CREATININE 0.99 0.93 1.03 1.00  0.99  CALCIUM 9.1 8.8* 9.0 8.7* 8.7*   GFR: Estimated Creatinine Clearance: 108.9 mL/min (by C-G formula based on SCr of 0.99 mg/dL). Liver Function Tests: No results for input(s): "AST", "ALT", "ALKPHOS",  "BILITOT", "PROT", "ALBUMIN" in the last 168 hours.  No results for input(s): "LIPASE", "AMYLASE" in the last 168 hours. No results for input(s): "AMMONIA" in the last 168 hours. Coagulation Profile: No results for input(s): "INR", "PROTIME" in the last 168 hours.  Cardiac Enzymes: No results for input(s): "CKTOTAL", "CKMB", "CKMBINDEX", "TROPONINI" in the last 168 hours. BNP (last 3 results) No results for input(s): "PROBNP" in the last 8760 hours. HbA1C: No results for input(s): "HGBA1C" in the last 72 hours.  CBG: Recent Labs  Lab 05/24/22 1556 05/24/22 1741 05/24/22 2044 05/25/22 0809 05/25/22 1130  GLUCAP 194* 197* 325* 220* 302*   Lipid Profile: No results for input(s): "CHOL", "HDL", "LDLCALC", "TRIG", "CHOLHDL", "LDLDIRECT" in the last 72 hours. Thyroid Function Tests: No results for input(s): "TSH", "T4TOTAL", "FREET4", "T3FREE", "THYROIDAB" in the last 72 hours. Anemia Panel: No results for input(s): "VITAMINB12", "FOLATE", "FERRITIN", "TIBC", "IRON", "RETICCTPCT" in the last 72 hours. Most Recent Urinalysis On File:     Component Value Date/Time   COLORURINE YELLOW (A) 05/20/2022 0134   APPEARANCEUR CLEAR (A) 05/20/2022 0134   LABSPEC 1.013 05/20/2022 0134   PHURINE 6.0 05/20/2022 0134   GLUCOSEU 50 (A) 05/20/2022 0134   HGBUR NEGATIVE 05/20/2022 0134   BILIRUBINUR NEGATIVE 05/20/2022 0134   KETONESUR NEGATIVE 05/20/2022 0134   PROTEINUR 100 (A) 05/20/2022 0134   NITRITE NEGATIVE 05/20/2022 0134   LEUKOCYTESUR NEGATIVE 05/20/2022 0134   Sepsis Labs: '@LABRCNTIP'$ (procalcitonin:4,lacticidven:4) Microbiology: Recent Results (from the past 240 hour(s))  Culture, blood (Routine x 2)     Status: None   Collection Time: 05/18/22  3:27 PM   Specimen: BLOOD  Result Value Ref Range Status   Specimen Description BLOOD RIGHT [facility]  Final   Special Requests   Final    BOTTLES DRAWN AEROBIC AND ANAEROBIC Blood Culture adequate volume   Culture   Final    NO GROWTH 5  DAYS Performed at [facility], [address] [place], [place] [address]    Report Status 05/23/2022 FINAL  Final  Culture, blood (Routine X 2) w Reflex to ID Panel     Status: None   Collection Time: 05/18/22 10:10 PM   Specimen: BLOOD  Result Value Ref Range Status   Specimen Description BLOOD BLOOD LEFT ARM  Final   Special Requests   Final    BOTTLES DRAWN AEROBIC AND ANAEROBIC Blood Culture adequate volume   Culture   Final    NO GROWTH 5 DAYS Performed at [facility], [address], [place], [place] [address]    Report Status 05/23/2022 FINAL  Final      Radiology Studies last 3 days: PERIPHERAL VASCULAR CATHETERIZATION  Result Date: 05/21/2022 See surgical note for result.            LOS: 7 days       Emeterio Reeve, DO [facility] 05/25/2022, 3:45 PM    Dictation software may have been used to generate the above note. Typos may occur and escape review in typed/dictated notes. Please contact Dr Sheppard Coil directly for clarity if needed.  Staff may message me via secure chat in [place]  but this may not receive an immediate response,  please page me for urgent matters!  If 7PM-7AM, please contact night coverage [email or web address]

## 2022-05-26 DIAGNOSIS — S81809A Unspecified open wound, unspecified lower leg, initial encounter: Secondary | ICD-10-CM | POA: Diagnosis not present

## 2022-05-26 LAB — BASIC METABOLIC PANEL
Anion gap: 6 (ref 5–15)
Anion gap: 6 (ref 5–15)
BUN: 43 mg/dL — ABNORMAL HIGH (ref 8–23)
BUN: 38 mg/dL — ABNORMAL HIGH (ref 8–23)
CO2: 21 mmol/L — ABNORMAL LOW (ref 22–32)
CO2: 22 mmol/L (ref 22–32)
Calcium: 8.4 mg/dL — ABNORMAL LOW (ref 8.9–10.3)
Calcium: 8.3 mg/dL — ABNORMAL LOW (ref 8.9–10.3)
Chloride: 102 mmol/L (ref 98–111)
Chloride: 100 mmol/L (ref 98–111)
Creatinine, Ser: 0.88 mg/dL (ref 0.61–1.24)
Creatinine, Ser: 0.99 mg/dL (ref 0.61–1.24)
GFR, Estimated: >60 mL/min (ref >60)
GFR, Estimated: >60 mL/min (ref >60)
Glucose, Bld: 102 mg/dL — ABNORMAL HIGH (ref 70–99)
Glucose, Bld: 239 mg/dL — ABNORMAL HIGH (ref 70–99)
Potassium: 4.3 mmol/L (ref 3.5–5.1)
Potassium: 4.2 mmol/L (ref 3.5–5.1)
Sodium: 129 mmol/L — ABNORMAL LOW (ref 135–145)
Sodium: 128 mmol/L — ABNORMAL LOW (ref 135–145)

## 2022-05-26 LAB — GLUCOSE, CAPILLARY
Glucose-Capillary: 108 mg/dL — ABNORMAL HIGH (ref 70–99)
Glucose-Capillary: 246 mg/dL — ABNORMAL HIGH (ref 70–99)
Glucose-Capillary: 446 mg/dL — ABNORMAL HIGH (ref 70–99)
Glucose-Capillary: 442 mg/dL — ABNORMAL HIGH (ref 70–99)

## 2022-05-26 LAB — CBC
HCT: 22.7 % — ABNORMAL LOW (ref 39.0–52.0)
Hemoglobin: 7.3 g/dL — ABNORMAL LOW (ref 13.0–17.0)
MCH: 28.6 pg (ref 26.0–34.0)
MCHC: 32.2 g/dL (ref 30.0–36.0)
MCV: 89 fL (ref 80.0–100.0)
Platelets: 306 10*3/uL (ref 150–400)
RBC: 2.55 MIL/uL — ABNORMAL LOW (ref 4.22–5.81)
RDW: 14.6 % (ref 11.5–15.5)
WBC: 14.3 10*3/uL — ABNORMAL HIGH (ref 4.0–10.5)
nRBC: 0 % (ref 0.0–0.2)

## 2022-05-26 LAB — TSH: TSH: 2.407 u[IU]/mL (ref 0.350–4.500)

## 2022-05-26 LAB — OSMOLALITY: Osmolality: 298 mOsm/kg — ABNORMAL HIGH (ref 275–295)

## 2022-05-26 MED ORDER — INSULIN ASPART 100 UNIT/ML IJ SOLN
10.0000 [IU] | Freq: Once | INTRAMUSCULAR | Status: AC
  Administered 2022-05-26: 10 [IU] via SUBCUTANEOUS
  Filled 2022-05-26: qty 1

## 2022-05-26 MED ORDER — SODIUM CHLORIDE 0.9 % IV SOLN: INTRAVENOUS | Status: DC

## 2022-05-26 MED ORDER — INSULIN ASPART 100 UNIT/ML IJ SOLN
5.0000 [IU] | Freq: Three times a day (TID) | INTRAMUSCULAR | Status: DC
  Administered 2022-05-26 – 2022-05-27 (×4): 5 [IU] via SUBCUTANEOUS
  Filled 2022-05-26 (×4): qty 1

## 2022-05-26 NOTE — NC FL2 (Signed)
[place] LEVEL OF CARE FORM     IDENTIFICATION  Patient Name: [name]. Birthdate: 1955/09/04 Sex: male Admission Date (Current Location): 05/18/2022  [facility] and [place] Number:  Engineering geologist and Address:  [facility], [address] [place], [place] [address]      Provider Number: [id number]  Attending Physician Name and Address:  Emeterio Reeve, DO  Relative Name and Phone Number:  Sandrea Matte (Spouse) [phone] (Mobile)    Current Level of Care: Hospital Recommended Level of Care: [place] Prior Approval Number:    Date Approved/Denied:   PASRR Number: [id number]  Discharge Plan:      Current Diagnoses: Patient Active Problem List   Diagnosis Date Noted   Acute osteomyelitis of right foot ([place]) 05/19/2022   Wound infection 05/18/2022   Ulcer of right ankle, with fat layer exposed ([place]) 05/18/2022   Non-healing wound of lower extremity, initial encounter 05/18/2022   CAD with Hx of CABG 05/18/2022   Chronic respiratory failure with hypoxia ([place]) 05/18/2022   S/P BKA (below knee amputation), left ([place]) 05/18/2022   COPD (chronic obstructive pulmonary disease) ([place]) 05/18/2022   Perinephric hematoma 03/20/2022   Bacteremia due to Klebsiella pneumoniae 03/19/2022   Hyperglycemia 03/16/2022   Septic shock ([place]) 03/16/2022   Sepsis secondary to UTI ([place]) 03/15/2022   Acute hypoxemic respiratory failure ([place]) 02/13/2022   Atrial fibrillation with rapid ventricular response ([place]) 02/11/2022   Uncontrolled type 2 diabetes mellitus with hyperglycemia, with long-term current use of insulin ([place]) 02/11/2022   Dyslipidemia 02/11/2022   Hypothyroidism 02/11/2022   Benign prostatic hyperplasia with lower urinary tract symptoms 02/11/2022   Pressure injury of skin of heel 11/04/2021   UTI (urinary tract infection) 10/22/2021   Iron deficiency anemia 10/19/2021   Hyperkalemia 10/14/2021    Acute urinary retention 10/08/2021   Sepsis due to methicillin resistant Staphylococcus aureus (MRSA) ([place]) 10/04/2021   Acute delirium 10/04/2021   Acute on chronic respiratory failure with hypoxia and hypercapnia ([name]) 10/04/2021   Acute respiratory failure with hypoxia and hypercapnia (HCC) 10/03/2021   OSA (obstructive sleep apnea) 10/03/2021   Malnutrition of moderate degree 09/29/2021   Generalized weakness 09/29/2021   Cellulitis of left foot 08/04/2021   Uncontrolled type 2 diabetes mellitus with hypoglycemia, with long-term current use of insulin ([place]) 08/04/2021   Bilateral lower extremity edema 08/04/2021   Acute osteomyelitis of lower leg, left ([place]) 08/04/2021   Swelling of limb 07/18/2021   CHF exacerbation ([place]) 05/08/2021   Diabetes mellitus without complication (HCC)    Atrial fibrillation with RVR (HCC)    Obesity, Class III, BMI 40-49.9 (morbid obesity) ([place])    Venous stasis ulcer ([place])    Sinus tachycardia    BPH (benign prostatic hyperplasia)    Chronic anticoagulation    Anasarca    Chronic diastolic CHF (congestive heart failure) (HCC)    Venous stasis of both lower extremities    AF (paroxysmal atrial fibrillation) ([place])    Lower extremity cellulitis 01/03/2021   Chronic osteomyelitis of left foot ([place]) 01/03/2021   Osteomyelitis ([place]) 01/03/2021   Severe sepsis ([place]) 02/24/2020   AKI (acute kidney injury) ([place]) 02/24/2020   Bilateral cellulitis of lower leg 02/23/2020   CAD (coronary artery disease) 02/02/2020   RLS (restless legs syndrome) 05/15/2019   Statin myopathy 05/15/2019   Diabetes mellitus type 2 in obese ([place]) 02/13/2016   Essential hypertension 02/13/2016   PAD (peripheral artery disease) ([place]) 02/13/2016  Pressure injury of skin 01/25/2016   Hypoglycemia associated with diabetes ([place]) 12/27/2013   Long-term insulin use ([place]) 12/27/2013   Microalbuminuria 12/27/2013   B12 deficiency 09/29/2013   Obesity 09/29/2013   CAD (coronary  artery disease), autologous vein bypass graft 07/20/2013   Hypersomnia with sleep apnea 07/20/2013   Pure hypercholesterolemia 07/20/2013   Osteomyelitis of ankle or foot 07/20/2011    Orientation RESPIRATION BLADDER Height & Weight     Self, Time, Situation, Place  Normal Continent Weight: 298 lb 12.8 oz (135.5 kg) Height:  '6\' 4"'$  (193 cm)  BEHAVIORAL SYMPTOMS/MOOD NEUROLOGICAL BOWEL NUTRITION STATUS      Continent Diet (carb modified)  AMBULATORY STATUS COMMUNICATION OF NEEDS Skin   Extensive Assist Verbally Surgical wounds (R knee)                       Personal Care Assistance Level of Assistance  Bathing, Feeding, Dressing Bathing Assistance: Maximum assistance Feeding assistance: Maximum assistance Dressing Assistance: Maximum assistance     Functional Limitations Info             SPECIAL CARE FACTORS FREQUENCY  PT (By licensed PT), OT (By licensed OT)     PT Frequency: 5 times per week OT Frequency: 5 times per week            Contractures      Additional Factors Info  Code Status, Allergies Code Status Info: full Allergies Info: Allergies: Penicillins, Statins, Metformin And Related           Current Medications (05/26/2022):  This is the current hospital active medication list Current Facility-Administered Medications  Medication Dose Route Frequency Provider Last Rate Last Admin   0.9 %  sodium chloride infusion   Intravenous Continuous Emeterio Reeve, DO 75 mL/hr at 05/26/22 1548 Infusion Verify at 05/26/22 1548   acetaminophen (TYLENOL) tablet 650 mg  650 mg Oral Q6H PRN Algernon Huxley, MD       Or   acetaminophen (TYLENOL) suppository 650 mg  650 mg Rectal Q6H PRN Algernon Huxley, MD       albuterol (PROVENTIL) (2.5 MG/3ML) 0.083% nebulizer solution 2.5 mg  2.5 mg Nebulization Q2H PRN Algernon Huxley, MD   2.5 mg at 05/21/22 2051   amiodarone (PACERONE) tablet 200 mg  200 mg Oral Daily Algernon Huxley, MD   200 mg at 05/26/22 1018   ascorbic acid  (VITAMIN C) tablet 500 mg  500 mg Oral BID Algernon Huxley, MD   500 mg at 05/26/22 1018   aspirin EC tablet 81 mg  81 mg Oral Daily Algernon Huxley, MD   81 mg at 05/26/22 1018   budesonide (PULMICORT) nebulizer solution 0.5 mg  0.5 mg Nebulization BID Algernon Huxley, MD   0.5 mg at 05/26/22 0748   clopidogrel (PLAVIX) tablet 75 mg  75 mg Oral Daily Algernon Huxley, MD   75 mg at 05/26/22 1018   cyanocobalamin (VITAMIN B12) tablet 5,000 mcg  5,000 mcg Oral Daily Algernon Huxley, MD   5,000 mcg at 05/26/22 1018   enoxaparin (LOVENOX) injection 67.5 mg  0.5 mg/kg Subcutaneous Q24H Algernon Huxley, MD   67.5 mg at 05/25/22 2151   ezetimibe (ZETIA) tablet 10 mg  10 mg Oral Daily Algernon Huxley, MD   10 mg at 05/26/22 1020   fentaNYL (SUBLIMAZE) injection 12.5 mcg  12.5 mcg Intravenous Once PRN Algernon Huxley, MD  fentaNYL (SUBLIMAZE) injection 12.5 mcg  12.5 mcg Intravenous Once PRN Algernon Huxley, MD       ferrous sulfate tablet 325 mg  325 mg Oral Q breakfast Dew, Erskine Squibb, MD   325 mg at 05/26/22 0800   finasteride (PROSCAR) tablet 5 mg  5 mg Oral Daily Algernon Huxley, MD   5 mg at 05/26/22 1020   gabapentin (NEURONTIN) capsule 300 mg  300 mg Oral QHS Algernon Huxley, MD   300 mg at 05/25/22 2133   HYDROcodone-acetaminophen (NORCO/VICODIN) 5-325 MG per tablet 1-2 tablet  1-2 tablet Oral Q4H PRN Algernon Huxley, MD   2 tablet at 05/26/22 1305   HYDROmorphone (DILAUDID) injection 1 mg  1 mg Intravenous Once PRN Algernon Huxley, MD       HYDROmorphone (DILAUDID) injection 1 mg  1 mg Intravenous Once PRN Algernon Huxley, MD       insulin aspart (novoLOG) injection 0-15 Units  0-15 Units Subcutaneous TID WC Algernon Huxley, MD   15 Units at 05/26/22 1659   insulin aspart (novoLOG) injection 0-5 Units  0-5 Units Subcutaneous QHS Algernon Huxley, MD   2 Units at 05/25/22 2156   insulin aspart (novoLOG) injection 5 Units  5 Units Subcutaneous TID WC Emeterio Reeve, DO   5 Units at 05/26/22 1659   insulin glargine-yfgn (SEMGLEE) injection 25  Units  25 Units Subcutaneous QHS Algernon Huxley, MD   25 Units at 05/25/22 2156   isosorbide mononitrate (IMDUR) 24 hr tablet 60 mg  60 mg Oral Daily Algernon Huxley, MD   60 mg at 05/26/22 1019   levothyroxine (SYNTHROID) tablet 25 mcg  25 mcg Oral Q0600 Algernon Huxley, MD   25 mcg at 05/26/22 6606   morphine (PF) 2 MG/ML injection 2 mg  2 mg Intravenous Q2H PRN Algernon Huxley, MD   2 mg at 05/26/22 1512   multivitamin with minerals tablet 1 tablet  1 tablet Oral Daily Algernon Huxley, MD   1 tablet at 05/26/22 1020   nutrition supplement (JUVEN) (JUVEN) powder packet 1 packet  1 packet Oral BID BM Algernon Huxley, MD   1 packet at 05/26/22 1512   ondansetron (ZOFRAN) tablet 4 mg  4 mg Oral Q6H PRN Algernon Huxley, MD       Or   ondansetron (ZOFRAN) injection 4 mg  4 mg Intravenous Q6H PRN Algernon Huxley, MD       ondansetron (ZOFRAN) injection 4 mg  4 mg Intravenous Q6H PRN Algernon Huxley, MD       pramipexole (MIRAPEX) tablet 2 mg  2 mg Oral QHS Algernon Huxley, MD   2 mg at 05/25/22 2151   primidone (MYSOLINE) tablet 250 mg  250 mg Oral BID Algernon Huxley, MD   250 mg at 05/26/22 1020   protein supplement (ENSURE MAX) liquid  11 oz Oral QHS Algernon Huxley, MD   11 oz at 05/24/22 2132   risperiDONE (RISPERDAL) tablet 0.5 mg  0.5 mg Oral BID Algernon Huxley, MD   0.5 mg at 05/26/22 1020   tamsulosin (FLOMAX) capsule 0.4 mg  0.4 mg Oral Daily Algernon Huxley, MD   0.4 mg at 05/26/22 1019   traZODone (DESYREL) tablet 100 mg  100 mg Oral QHS PRN Algernon Huxley, MD   100 mg at 05/25/22 2133   zinc sulfate capsule 220 mg  220 mg Oral Daily Dew,  Erskine Squibb, MD   220 mg at 05/26/22 1018     Discharge Medications: Please see discharge summary for a list of discharge medications.  Relevant Imaging Results:  Relevant Lab Results:   Additional Information SS# [id number]  Dailan Pfalzgraf E Jannet Calip, LCSW

## 2022-05-26 NOTE — TOC Progression Note (Addendum)
Transition of Care (TOC) - Progression Note    Patient Details  Name: [name]. MRN: [id number] Date of Birth: 28-Oct-1955  Transition [facility]) CM/SW [place], LCSW Phone Number: 05/26/2022, 1:24 PM  Clinical Narrative:    CSW spoke to patient's spouse on room phone.  Explained SNF rec. She requested TOC come talk to her and patient at bedside. Explained this CSW is working remotely and will ask coworker to meet with them.   5:15- Called into patient's room as RNCM unable to meet at bedside today. Patient states he is agreeable to SNF. Patient also had CSW speak with his wife again.  Patient and wife state they do not want [facility], [facility], or [facility].  Patient's wife stated she would like it sent to other SNFs in [place] and locally.  Patient's wife requested she be called with bed offers at [phone].  Expected Discharge Plan: Home/Self Care Barriers to Discharge: Continued Medical Work up  Expected Discharge Plan and Services   Discharge Planning Services: CM Consult   Living arrangements for the past 2 months: [place]: [place] Determinants of Health (SDOH) Interventions SDOH Screenings   Food Insecurity: No Food Insecurity (05/19/2022)  Housing: Low Risk  (05/19/2022)  Transportation Needs: No Transportation Needs (05/19/2022)  Recent Concern: Transportation Needs - Unmet Transportation Needs (03/21/2022)  Utilities: Not At Risk (05/19/2022)  Depression (PHQ2-9): Low Risk  (11/21/2021)  Tobacco Use: Medium Risk (05/25/2022)    Readmission Risk Interventions    02/13/2022    4:57 PM 09/29/2021   11:12 AM  Readmission Risk Prevention Plan  Transportation Screening Complete Complete  PCP or Specialist Appt within 3-5 Days  Complete  HRI or [place]  Complete  Social Work Consult for [place]  Planning/Counseling  Complete  Palliative Care Screening  Not Applicable  Medication Review Press photographer) Complete Complete  PCP or Specialist appointment within 3-5 days of discharge Complete   HRI or [place] Complete   SW Recovery Care/Counseling Consult Complete   [place] Not Applicable

## 2022-05-26 NOTE — Progress Notes (Signed)
Physical Therapy Treatment Patient Details Name: [name]. MRN: [id number] DOB: 1956/01/18 Today's Date: 05/26/2022   History of Present Illness Pt is a 67 y/o M admitted on 05/18/22 with concern for right ankle osteomyelitis after an injury in December 2023. Pt was seen in consultation by podiatry, who recommended continuation of empiric IV antibiotics, underwent angiogram & angioplasty of the right anterior tibial artery with vascular surgery on 01/22. After consideration, opted for & underwent R BKA 01/25 given low likelihood of healing with non-surgical management. PMH: COPD, chronic respiratory failure on home O2, CAD s/p CABG, IDDM2, OSA on bipap at night, BPH HTN, hypothyroidism, paroxysmal a-fib on eliquis & amiodarone, PAD, L BKA    PT Comments    Pt was long sitting in bed upon arrival. no pain at rest but have severe 9/10 pain with any/all movements. He is agreeable to sitting EOB but did not want to perform transfer to recliner. Eventually agrees to OOB to recliner via hoyer lift. Pt tolerated sitting EOB x ~ 12 minutes while performing stretching and LE there ex. Author reviewed importance of positioning and need for increased activity to maximize strength while decreasing caregiver burden. Pt's spouse in room and requested review session on how hoyer lift transfers are performed. She was receptive and voiced better understanding by the end of session. Author issued BKA handout and recommended pt perform throughout the day. SNF at [place] remains most appropriate.    Recommendations for follow up therapy are one component of a multi-disciplinary discharge planning process, led by the attending physician.  Recommendations may be updated based on patient status, additional functional criteria and insurance authorization.  Follow Up Recommendations  Skilled nursing-short term rehab (<3 hours/day)     Assistance Recommended at Discharge Frequent or constant Supervision/Assistance   Patient can return home with the following A lot of help with walking and/or transfers;A lot of help with bathing/dressing/bathroom;Assist for transportation;Help with stairs or ramp for entrance;Direct supervision/assist for financial management;Assistance with cooking/housework   Equipment Recommendations  None recommended by PT       Precautions / Restrictions Precautions Precautions: Fall Restrictions Weight Bearing Restrictions: Yes RLE Weight Bearing: Non weight bearing LLE Weight Bearing: Non weight bearing     Mobility  Bed Mobility Overal bed mobility: Needs Assistance Bed Mobility: Rolling, Supine to Sit, Sit to Supine Rolling: Mod assist, Max assist Supine to sit: Max assist Sit to supine: Max assist   General bed mobility comments: pt performed rolling R to short sit with max assist of one. sat EOB x ~ 12 minutes while performing ther ex on BLEs.    Transfers Overall transfer level: Needs assistance Transfers: Bed to chair/wheelchair/BSC    General transfer comment: elected to use mechanical lift for OOB to recliner due to pt's pain/ fatigue. Pt's spouse was present an requested  author review hoyer transfers with her for when pt returns home.   Assisted RN staff with performance.      Balance Overall balance assessment: Needs assistance Sitting-balance support: Feet supported, Bilateral upper extremity supported Sitting balance-Leahy Scale: Poor       Cognition Arousal/Alertness: Awake/alert Behavior During Therapy: WFL for tasks assessed/performed Overall Cognitive Status: Within Functional Limits for tasks assessed      General Comments: Pt is A and oriented but presnts with flat affect. Is well know by author from previous admissions, seems to be at same cognition level as last seen by author several months prior.  General Comments General comments (skin integrity, edema, etc.): Author issued HEP ahndout with instructions. Reviewed  importance of positioning, stretching, and need for increased daily activity to promote return in independnece while decreasing caregiver burden.      Pertinent Vitals/Pain Pain Assessment Pain Assessment: 0-10 Pain Score: 7  Faces Pain Scale: Hurts whole lot Pain Location: R limb Pain Descriptors / Indicators: Discomfort Pain Intervention(s): Limited activity within patient's tolerance, Monitored during session, Premedicated before session, Repositioned     PT Goals (current goals can now be found in the care plan section) Acute Rehab PT Goals Patient Stated Goal: decreased pain, get better Progress towards PT goals: Progressing toward goals    Frequency    7X/week      PT Plan Current plan remains appropriate       AM-PAC PT "6 Clicks" Mobility   Outcome Measure  Help needed turning from your back to your side while in a flat bed without using bedrails?: A Lot Help needed moving from lying on your back to sitting on the side of a flat bed without using bedrails?: A Lot Help needed moving to and from a bed to a chair (including a wheelchair)?: Total Help needed standing up from a chair using your arms (e.g., wheelchair or bedside chair)?: Total Help needed to walk in hospital room?: Total Help needed climbing 3-5 steps with a railing? : Total 6 Click Score: 8    End of Session   Activity Tolerance: Patient tolerated treatment well Patient left: in chair;with nursing/sitter in room Nurse Communication: Mobility status (recommend RN staff perform mechanic lift transfers only) PT Visit Diagnosis: Muscle weakness (generalized) (M62.81);Other abnormalities of gait and mobility (R26.89);Pain Pain - Right/Left: Right Pain - part of body: Leg     Time: 6948-5462 PT Time Calculation (min) (ACUTE ONLY): 24 min  Charges:  $Therapeutic Exercise: 8-22 mins $Therapeutic Activity: 8-22 mins                     Julaine Fusi PTA 05/26/22, 1:46 PM

## 2022-05-26 NOTE — Progress Notes (Addendum)
PROGRESS NOTE    [name].   NLG:[id number] DOB: [birth year]  DOA: 05/18/2022 Date of Service: 05/26/22 PCP: [name], MD     Brief Narrative / Hospital Course:  This is a pleasant 67 year old gentleman with a history of COPD, chronic respiratory failure on home O2, CAD status post CABG, insulin-dependent type 2 diabetes, OSA on BiPAP at night, BPH hypertension, hypothyroidism and paroxysmal atrial fibrillation on Eliquis and amiodarone who has peripheral arterial disease status post left BKA and is admitted to the hospital with concern for right ankle osteomyelitis after an injury in December 2023.  He was seen in consultation by podiatry, who recommends continuation of empiric IV antibiotics, underwent angiogram and angioplasty of the right anterior tibial artery with vascular surgery on 01/22. After consideration, opted for R BKA 01/24 given low likelihood of healing with non-surgical management. Awaiting SNF placement. Stopped abx 01/26 Friday. Planning SNF rehab placement after the weekend.   Consultants:  Podiatry Vascular Surgery   Procedures: 05/21/22 RLE Angiogram with angioplasty  05/24/22 R below-knee amputation       ASSESSMENT & PLAN:   Principal Problem:   Non-healing wound of lower extremity, initial encounter Active Problems:   Acute osteomyelitis of right foot (HCC)   PAD (peripheral artery disease) (HCC)   S/P BKA (below knee amputation), left (HCC)   AKI (acute kidney injury) ([place])   Hyperkalemia   Chronic anticoagulation   AF (paroxysmal atrial fibrillation) ([place])   Uncontrolled type 2 diabetes mellitus with hypoglycemia, with long-term current use of insulin (HCC)   OSA (obstructive sleep apnea)   Chronic respiratory failure with hypoxia (HCC)   COPD (chronic obstructive pulmonary disease) (HCC)   Chronic diastolic CHF (congestive heart failure) (HCC)   CAD with Hx of CABG   Essential hypertension   BPH (benign prostatic  hyperplasia)  Acute osteomyelitis of right foot and ankle (HCC) Nonhealing wound right ankle PAD s/p left BKA S/p angiogram with angioplasty 1/22 with vascular surgery S/p R BKA 05/24/22  D/c cefepime and vancomycin 05/25/22  SNF placement pending after the weekend  Hyperkalemia - resolved Monitor BMP   AKI (acute kidney injury) ([place]) - resolved Monitor BMP   Pseudohyponatremia d/t hyperglucemia Hyponatremia on am labs today Repeating BMP to confirm: Na 128 and Glc 139 --> corrects to 130 Restart gentle IV fluids and obtain serum Osm and Urine Na/Osm, TSH  Uncontrolled type 2 diabetes mellitus with hypoglycemia, with long-term current use of insulin (HCC) Blood sugar in the 300s on admission initially Continue home basal insulin Sliding scale coverage   AF (paroxysmal atrial fibrillation) (HCC) Chronic anticoagulation Continue amiodarone Restarted apixiban 01/23   COPD (chronic obstructive pulmonary disease) ([place]) OSA on home BiPAP qhs Chronic respiratory failure on home O2 Patient is at baseline Continue home Pulmicort and inhalers Continue BiPAP and O2 at night and anytime he is sleeping   CAD with Hx of CABG Continue aspirin, ezetimibe, Imdur, losartan   Chronic diastolic CHF (congestive heart failure) (HCC) Clinically euvolemic but did require 1500 mL IV fluids to maintain BP postoperatively holding Lasix, losartan given low BP --> restart when able   Postoperative hypotension - resolved Chronic Essential hypertension  Holding home antihypertensives for now given low BP after R BKA yesterday Hypotension responded to 1500 mL fluids and MAP appropriate, closely monitor VS and fluid status back on the floor   BPH (benign prostatic hyperplasia) Continue finasteride and tamsulosin     DVT prophylaxis: lovenox  Pertinent IV fluids/nutrition:  no continuous IV fluids  Central lines / invasive devices: none   Code Status: FULL CODE   Current Admission Status:  inpatient   TOC needs / Dispo plan: will need SNF rehab Barriers to discharge / significant pending items: s/p BKA POD2, wil need SNF rehab after the weekend (today is Saturday)              Subjective / Brief ROS:  Pt feeling okay today, no complaints.   Family Communication: none at this time     Objective Findings:  Vitals:   05/25/22 1625 05/26/22 0003 05/26/22 0749 05/26/22 0750  BP: (!) 115/56 (!) 95/46 106/67   Pulse: 94 95 85   Resp: '18 20 17   '$ Temp: 98.7 F (37.1 C) 98.6 F (37 C) 98.4 F (36.9 C)   TempSrc:      SpO2: 97% 94% 97% 95%  Weight:      Height:        Intake/Output Summary (Last 24 hours) at 05/26/2022 1237 Last data filed at 05/25/2022 1834 Gross per 24 hour  Intake --  Output 400 ml  Net -400 ml   Filed Weights   05/18/22 2150  Weight: 135.5 kg    Examination:  Physical Exam Constitutional:      Appearance: Normal appearance. He is obese. He is not ill-appearing.  Cardiovascular:     Rate and Rhythm: Normal rate and regular rhythm.  Pulmonary:     Effort: Pulmonary effort is normal.     Breath sounds: Normal breath sounds.  Abdominal:     Palpations: Abdomen is soft.  Musculoskeletal:     Comments: LLE BKA, RLE BKA  Skin:    General: Skin is dry.  Neurological:     General: No focal deficit present.     Mental Status: He is alert and oriented to person, place, and time.  Psychiatric:        Behavior: Behavior normal.        Thought Content: Thought content normal.          Scheduled Medications:   amiodarone  200 mg Oral Daily   ascorbic acid  500 mg Oral BID   aspirin EC  81 mg Oral Daily   budesonide  0.5 mg Nebulization BID   clopidogrel  75 mg Oral Daily   cyanocobalamin  5,000 mcg Oral Daily   enoxaparin (LOVENOX) injection  0.5 mg/kg Subcutaneous Q24H   ezetimibe  10 mg Oral Daily   ferrous sulfate  325 mg Oral Q breakfast   finasteride  5 mg Oral Daily   gabapentin  300 mg Oral QHS   insulin aspart   0-15 Units Subcutaneous TID WC   insulin aspart  0-5 Units Subcutaneous QHS   insulin aspart  3 Units Subcutaneous TID WC   insulin glargine-yfgn  25 Units Subcutaneous QHS   isosorbide mononitrate  60 mg Oral Daily   levothyroxine  25 mcg Oral Q0600   multivitamin with minerals  1 tablet Oral Daily   nutrition supplement (JUVEN)  1 packet Oral BID BM   pramipexole  2 mg Oral QHS   primidone  250 mg Oral BID   Ensure Max Protein  11 oz Oral QHS   risperiDONE  0.5 mg Oral BID   tamsulosin  0.4 mg Oral Daily   zinc sulfate  220 mg Oral Daily    Continuous Infusions:  sodium chloride       PRN Medications:  acetaminophen **OR** acetaminophen, albuterol,  fentaNYL (SUBLIMAZE) injection, fentaNYL (SUBLIMAZE) injection, HYDROcodone-acetaminophen, HYDROmorphone (DILAUDID) injection, HYDROmorphone (DILAUDID) injection, morphine injection, ondansetron **OR** ondansetron (ZOFRAN) IV, ondansetron (ZOFRAN) IV, traZODone  Antimicrobials from admission:  Anti-infectives (From admission, onward)    Start     Dose/Rate Route Frequency Ordered Stop   05/24/22 1000  vancomycin (VANCOREADY) IVPB 1500 mg/300 mL        1,500 mg 150 mL/hr over 120 Minutes Intravenous 120 min pre-op 05/23/22 1830 05/25/22 1459   05/21/22 1655  ciprofloxacin (CIPRO) IVPB  Status:  Discontinued        over 60 Minutes  Continuous PRN 05/21/22 1655 05/21/22 1737   05/21/22 1515  ciprofloxacin (CIPRO) IVPB 400 mg  Status:  Discontinued        400 mg 200 mL/hr over 60 Minutes Intravenous 60 min pre-op 05/21/22 1515 05/21/22 1736   05/19/22 1200  vancomycin (VANCOREADY) IVPB 1250 mg/250 mL  Status:  Discontinued        1,250 mg 166.7 mL/hr over 90 Minutes Intravenous Every 12 hours 05/19/22 0739 05/25/22 1511   05/19/22 0100  vancomycin (VANCOREADY) IVPB 500 mg/100 mL       See Hyperspace for full Linked Orders Report.   500 mg 100 mL/hr over 60 Minutes Intravenous  Once 05/18/22 2209 05/19/22 0241   05/18/22 2300   vancomycin (VANCOREADY) IVPB 2000 mg/400 mL       See Hyperspace for full Linked Orders Report.   2,000 mg 200 mL/hr over 120 Minutes Intravenous  Once 05/18/22 2209 05/19/22 0138   05/18/22 2300  ceFEPIme (MAXIPIME) 2 g in sodium chloride 0.9 % 100 mL IVPB  Status:  Discontinued        2 g 200 mL/hr over 30 Minutes Intravenous Every 8 hours 05/18/22 2209 05/25/22 1511   05/18/22 2210  vancomycin variable dose per unstable renal function (pharmacist dosing)  Status:  Discontinued         Does not apply See admin instructions 05/18/22 2211 05/20/22 0908   05/18/22 2145  metroNIDAZOLE (FLAGYL) tablet 500 mg        500 mg Oral Every 12 hours 05/18/22 2116 05/25/22 2144   05/18/22 2000  metroNIDAZOLE (FLAGYL) IVPB 500 mg  Status:  Discontinued        500 mg 100 mL/hr over 60 Minutes Intravenous  Once 05/18/22 1948 05/18/22 2144   05/18/22 2000  ceFEPIme (MAXIPIME) 2 g in sodium chloride 0.9 % 100 mL IVPB  Status:  Discontinued        2 g 200 mL/hr over 30 Minutes Intravenous  Once 05/18/22 1959 05/18/22 2209   05/18/22 2000  vancomycin (VANCOCIN) IVPB 1000 mg/200 mL premix  Status:  Discontinued        1,000 mg 200 mL/hr over 60 Minutes Intravenous  Once 05/18/22 1959 05/18/22 2209           Data Reviewed:  I have personally reviewed the following...  CBC: Recent Labs  Lab 05/22/22 0442 05/23/22 0417 05/24/22 0328 05/24/22 1250 05/25/22 0446 05/26/22 0525  WBC 11.3* 10.0 11.2*  --  12.2* 14.3*  HGB 9.2* 10.1* 9.6* 9.9* 8.2* 7.3*  HCT 29.6* 32.6* 30.7* 31.4* 26.7* 22.7*  MCV 89.7 90.6 89.5  --  91.1 89.0  PLT 305 291 316  --  302 423   Basic Metabolic Panel: Recent Labs  Lab 05/23/22 0417 05/24/22 0328 05/25/22 0446 05/26/22 0525 05/26/22 1146  NA 135 132* 134* 129* 128*  K 3.8 4.3 4.3 4.3 4.2  CL  104 101 104 102 100  CO2 '24 23 22 '$ 21* 22  GLUCOSE 152* 257* 264* 102* 239*  BUN 39* 43* 46* 43* 38*  CREATININE 1.03 1.00 0.99 0.88 0.99  CALCIUM 9.0 8.7* 8.7* 8.4*  8.3*   GFR: Estimated Creatinine Clearance: 108.9 mL/min (by C-G formula based on SCr of 0.99 mg/dL). Liver Function Tests: No results for input(s): "AST", "ALT", "ALKPHOS", "BILITOT", "PROT", "ALBUMIN" in the last 168 hours.  No results for input(s): "LIPASE", "AMYLASE" in the last 168 hours. No results for input(s): "AMMONIA" in the last 168 hours. Coagulation Profile: No results for input(s): "INR", "PROTIME" in the last 168 hours.  Cardiac Enzymes: No results for input(s): "CKTOTAL", "CKMB", "CKMBINDEX", "TROPONINI" in the last 168 hours. BNP (last 3 results) No results for input(s): "PROBNP" in the last 8760 hours. HbA1C: No results for input(s): "HGBA1C" in the last 72 hours.  CBG: Recent Labs  Lab 05/25/22 1130 05/25/22 1741 05/25/22 2048 05/26/22 0747 05/26/22 1208  GLUCAP 302* 305* 237* 108* 246*   Lipid Profile: No results for input(s): "CHOL", "HDL", "LDLCALC", "TRIG", "CHOLHDL", "LDLDIRECT" in the last 72 hours. Thyroid Function Tests: No results for input(s): "TSH", "T4TOTAL", "FREET4", "T3FREE", "THYROIDAB" in the last 72 hours. Anemia Panel: No results for input(s): "VITAMINB12", "FOLATE", "FERRITIN", "TIBC", "IRON", "RETICCTPCT" in the last 72 hours. Most Recent Urinalysis On File:     Component Value Date/Time   COLORURINE YELLOW (A) 05/20/2022 0134   APPEARANCEUR CLEAR (A) 05/20/2022 0134   LABSPEC 1.013 05/20/2022 0134   PHURINE 6.0 05/20/2022 0134   GLUCOSEU 50 (A) 05/20/2022 0134   HGBUR NEGATIVE 05/20/2022 0134   BILIRUBINUR NEGATIVE 05/20/2022 0134   KETONESUR NEGATIVE 05/20/2022 0134   PROTEINUR 100 (A) 05/20/2022 0134   NITRITE NEGATIVE 05/20/2022 0134   LEUKOCYTESUR NEGATIVE 05/20/2022 0134   Sepsis Labs: '@LABRCNTIP'$ (procalcitonin:4,lacticidven:4) Microbiology: Recent Results (from the past 240 hour(s))  Culture, blood (Routine x 2)     Status: None   Collection Time: 05/18/22  3:27 PM   Specimen: BLOOD  Result Value Ref Range Status    Specimen Description BLOOD RIGHT [facility]  Final   Special Requests   Final    BOTTLES DRAWN AEROBIC AND ANAEROBIC Blood Culture adequate volume   Culture   Final    NO GROWTH 5 DAYS Performed at [facility], [address] [place], [place] [address]    Report Status 05/23/2022 FINAL  Final  Culture, blood (Routine X 2) w Reflex to ID Panel     Status: None   Collection Time: 05/18/22 10:10 PM   Specimen: BLOOD  Result Value Ref Range Status   Specimen Description BLOOD BLOOD LEFT ARM  Final   Special Requests   Final    BOTTLES DRAWN AEROBIC AND ANAEROBIC Blood Culture adequate volume   Culture   Final    NO GROWTH 5 DAYS Performed at [facility], [address], [place], [place] [address]    Report Status 05/23/2022 FINAL  Final      Radiology Studies last 3 days: No results found.           LOS: 8 days       Emeterio Reeve, DO [facility] 05/26/2022, 12:37 PM    Dictation software may have been used to generate the above note. Typos may occur and escape review in typed/dictated notes. Please contact Dr Sheppard Coil directly for clarity if needed.  Staff may message me via secure chat in [place]  but this may not  receive an immediate response,  please page me for urgent matters!  If 7PM-7AM, please contact night coverage [email or web address]

## 2022-05-26 NOTE — Progress Notes (Signed)
Subjective: Interval History: has no complaint of pain..   Objective: Vital signs in last 24 hours: Temp:  [98.4 F (36.9 C)-98.7 F (37.1 C)] 98.4 F (36.9 C) (01/27 0749) Pulse Rate:  [85-95] 85 (01/27 0749) Resp:  [17-20] 17 (01/27 0749) BP: (95-115)/(46-67) 106/67 (01/27 0749) SpO2:  [94 %-97 %] 95 % (01/27 0750) FiO2 (%):  [21 %] 21 % (01/26 2338)  Intake/Output from previous day: 01/26 0701 - 01/27 0700 In: -  Out: 800 [Urine:800] Intake/Output this shift: No intake/output data recorded.  General appearance: alert and no distress Incision/Wound: Dressing clean dry and intact  Lab Results: Recent Labs    05/25/22 0446 05/26/22 0525  WBC 12.2* 14.3*  HGB 8.2* 7.3*  HCT 26.7* 22.7*  PLT 302 306   BMET Recent Labs    05/25/22 0446 05/26/22 0525  NA 134* 129*  K 4.3 4.3  CL 104 102  CO2 22 21*  GLUCOSE 264* 102*  BUN 46* 43*  CREATININE 0.99 0.88  CALCIUM 8.7* 8.4*    Studies/Results: PERIPHERAL VASCULAR CATHETERIZATION  Result Date: 05/21/2022 See surgical note for result.  MR ANKLE RIGHT WO CONTRAST  Result Date: 05/18/2022 CLINICAL DATA:  Ankle pain, chronic, inflammatory arthritis suspected. Patient sent due to bone infection in right lateral malleolus. EXAM: MRI OF THE RIGHT ANKLE WITHOUT CONTRAST TECHNIQUE: Multiplanar, multisequence MR imaging of the ankle was performed. No intravenous contrast was administered. COMPARISON:  Radiographs dated February 11, 2022 FINDINGS: TENDONS Peroneal: Peroneal longus tendon intact. Peroneal brevis intact. Edema along the peroneus brevis and peroneus longus tendons consistent with tenosynovitis. Posteromedial: Posterior tibial tendon intact. Flexor hallucis longus tendon intact. Flexor digitorum longus tendon intact. Anterior: Tibialis anterior tendon intact. Extensor hallucis longus tendon intact Extensor digitorum longus tendon intact. Achilles: Intermediate intrasubstance signal of the distal Achilles tendon  suggesting tendinosis without tear. Plantar Fascia: Intact. LIGAMENTS Lateral: Anterior talofibular ligament intact. Calcaneofibular ligament intact. Posterior talofibular ligament intact. Anterior and posterior tibiofibular ligaments intact. Medial: Deltoid ligament intact. Spring ligament intact. CARTILAGE Ankle Joint: Small joint effusion. Normal ankle mortise. No chondral defect. Subtalar Joints/Sinus Tarsi: Degenerative changes with cartilage defect and subchondral cyst of the subtalar joint in the body of the talus. Normal sinus tarsi. Bones: Bone marrow edema of the lateral malleolus adjacent to the deep skin wound consistent with osteomyelitis. Soft Tissue: Generalized subcutaneous soft tissue edema about the medial and lateral aspect of the ankle/foot no drainable fluid collection or hematoma. Increased intra substance signal of the plantar muscles suggesting myopathy/myositis. Tarsal tunnel is normal. IMPRESSION: 1. Bone marrow edema of the lateral malleolus adjacent to the deep skin wound consistent with osteomyelitis. 2. Tenosynovitis of the peroneus brevis and peroneus longus tendons without evidence of tendon tear. 3. Generalized subcutaneous soft tissue edema about the medial and lateral aspect of the ankle/foot without drainable fluid collection or hematoma. 4. Increased intra substance signal of the plantar muscles suggesting myopathy/myositis. Electronically Signed   By: Keane Police D.O.   On: 05/18/2022 21:58   DG Chest 2 View  Result Date: 05/18/2022 CLINICAL DATA:  Suspected sepsis. EXAM: CHEST - 2 VIEW COMPARISON:  03/27/2022. FINDINGS: Unchanged fracture of the uppermost median sternotomy wire. Remaining wires appear intact. No focal airspace opacity. Stable postoperative changes of CABG. Normal heart size. Stable mediastinal contours with atherosclerotic calcifications and tortuosity of the aorta. No pleural effusion or pneumothorax. Visualized bones and upper abdomen are unremarkable.  IMPRESSION: No acute cardiopulmonary disease. Electronically Signed   By: Lyndal Rainbow.D.  On: 05/18/2022 16:05   CT Abdomen Pelvis W Contrast  Result Date: 05/03/2022 CLINICAL DATA:  Retroperitoneal hematoma. EXAM: CT ABDOMEN AND PELVIS WITH CONTRAST TECHNIQUE: Multidetector CT imaging of the abdomen and pelvis was performed using the standard protocol following bolus administration of intravenous contrast. RADIATION DOSE REDUCTION: This exam was performed according to the departmental dose-optimization program which includes automated exposure control, adjustment of the mA and/or kV according to patient size and/or use of iterative reconstruction technique. CONTRAST:  148m OMNIPAQUE IOHEXOL 350 MG/ML SOLN COMPARISON:  04/02/2022 FINDINGS: Lower chest: Unremarkable. Hepatobiliary: The liver shows diffusely decreased attenuation suggesting fat deposition. Calcified gallstones evident. No intrahepatic or extrahepatic biliary dilation. Pancreas: No focal mass lesion. No dilatation of the main duct. No intraparenchymal cyst. No peripancreatic edema. Spleen: No splenomegaly. No focal mass lesion. Adrenals/Urinary Tract: No adrenal nodule or mass. The subcapsular hematoma noted posteriorly in the right kidney measures 7.9 x 4.2 x 13.5 cm today. This compares to 9.5 x 5.2 x 13.3 cm previously. Mildly decreased perfusion to the right kidney is again noted. Inferior extra capsular extension again noted measuring 6.9 x 4.2 cm at the same level it was measured previously at 9.0 x 4.1 cm. As before, there are scattered pockets of gas in the subcapsular and retroperitoneal components of this process. Perinephric edema/strandy hemorrhage persists. Stable appearance of the left kidney with small exophytic cyst noted posteriorly. No hydroureteronephrosis. The urinary bladder appears normal for the degree of distention. Stomach/Bowel: Tiny hiatal hernia. Stomach otherwise unremarkable. Duodenum is normally positioned as  is the ligament of Treitz. No small bowel wall thickening. No small bowel dilatation. The terminal ileum is normal. The appendix is not well visualized, but there is no edema or inflammation in the region of the cecum. No gross colonic mass. No colonic wall thickening. Diverticular changes are noted in the left colon without evidence of diverticulitis. Vascular/Lymphatic: There is mild atherosclerotic calcification of the abdominal aorta without aneurysm. Scattered small lymph nodes are seen in the hepatoduodenal ligament and retroperitoneal soft tissues. No pelvic sidewall lymphadenopathy. Upper normal lymph nodes are seen in the right external iliac chain and right groin. Reproductive: The prostate gland and seminal vesicles are unremarkable. Other: No intraperitoneal free fluid. Musculoskeletal: No worrisome lytic or sclerotic osseous abnormality. IMPRESSION: 1. No substantial interval change in the right renal subcapsular hematoma with inferior extra capsular extension. As before, there are scattered pockets of gas in the subcapsular and retroperitoneal components of this process. Superinfection of the hematoma cannot be excluded. 2. Persistent marked mass-effect on the right kidney secondary to the subcapsular hematoma. Subtle differential decreased perfusion to the right kidney again noted. 3. Hepatic steatosis. 4. Cholelithiasis. 5. Left colonic diverticulosis without diverticulitis. 6. Tiny hiatal hernia. 7. Upper normal lymph nodes in the right external iliac chain and right groin. These are likely reactive. Continued attention on follow-up recommended. 8.  Aortic Atherosclerosis (ICD10-I70.0). Electronically Signed   By: EMisty StanleyM.D.   On: 05/03/2022 15:49   Anti-infectives: Anti-infectives (From admission, onward)    Start     Dose/Rate Route Frequency Ordered Stop   05/24/22 1000  vancomycin (VANCOREADY) IVPB 1500 mg/300 mL        1,500 mg 150 mL/hr over 120 Minutes Intravenous 120 min  pre-op 05/23/22 1830 05/25/22 1459   05/21/22 1655  ciprofloxacin (CIPRO) IVPB  Status:  Discontinued        over 60 Minutes  Continuous PRN 05/21/22 1655 05/21/22 1737   05/21/22 1515  ciprofloxacin (  CIPRO) IVPB 400 mg  Status:  Discontinued        400 mg 200 mL/hr over 60 Minutes Intravenous 60 min pre-op 05/21/22 1515 05/21/22 1736   05/19/22 1200  vancomycin (VANCOREADY) IVPB 1250 mg/250 mL  Status:  Discontinued        1,250 mg 166.7 mL/hr over 90 Minutes Intravenous Every 12 hours 05/19/22 0739 05/25/22 1511   05/19/22 0100  vancomycin (VANCOREADY) IVPB 500 mg/100 mL       See Hyperspace for full Linked Orders Report.   500 mg 100 mL/hr over 60 Minutes Intravenous  Once 05/18/22 2209 05/19/22 0241   05/18/22 2300  vancomycin (VANCOREADY) IVPB 2000 mg/400 mL       See Hyperspace for full Linked Orders Report.   2,000 mg 200 mL/hr over 120 Minutes Intravenous  Once 05/18/22 2209 05/19/22 0138   05/18/22 2300  ceFEPIme (MAXIPIME) 2 g in sodium chloride 0.9 % 100 mL IVPB  Status:  Discontinued        2 g 200 mL/hr over 30 Minutes Intravenous Every 8 hours 05/18/22 2209 05/25/22 1511   05/18/22 2210  vancomycin variable dose per unstable renal function (pharmacist dosing)  Status:  Discontinued         Does not apply See admin instructions 05/18/22 2211 05/20/22 0908   05/18/22 2145  metroNIDAZOLE (FLAGYL) tablet 500 mg        500 mg Oral Every 12 hours 05/18/22 2116 05/25/22 2144   05/18/22 2000  metroNIDAZOLE (FLAGYL) IVPB 500 mg  Status:  Discontinued        500 mg 100 mL/hr over 60 Minutes Intravenous  Once 05/18/22 1948 05/18/22 2144   05/18/22 2000  ceFEPIme (MAXIPIME) 2 g in sodium chloride 0.9 % 100 mL IVPB  Status:  Discontinued        2 g 200 mL/hr over 30 Minutes Intravenous  Once 05/18/22 1959 05/18/22 2209   05/18/22 2000  vancomycin (VANCOCIN) IVPB 1000 mg/200 mL premix  Status:  Discontinued        1,000 mg 200 mL/hr over 60 Minutes Intravenous  Once 05/18/22 1959  05/18/22 2209       Assessment/Plan: s/p Procedure(s): AMPUTATION BELOW KNEE (Right) Continue therapies.  Anticipate dressing change on Monday.   LOS: 8 days   [name] 05/26/2022, 11:09 AM

## 2022-05-26 NOTE — Progress Notes (Signed)
CROSS COVER NOTE  NAME: [name]. MRN: [id number] DOB : [birth year]     HPI/Events of Note   CBG 442  Assessment and  Interventions   Assessment: Review of insulin regiment and glucose trend Plan: Insulin 10 units SQ x1 Consider increasing long acting  insulin  or increasing to resistant scale      Kathlene Cote NP [facility]

## 2022-05-26 NOTE — Inpatient Diabetes Management (Signed)
Inpatient Diabetes Program Recommendations  AACE/ADA: New Consensus Statement on Inpatient Glycemic Control (2015)  Target Ranges:  Prepandial:   less than 140 mg/dL      Peak postprandial:   less than 180 mg/dL (1-2 hours)      Critically ill patients:  140 - 180 mg/dL   Lab Results  Component Value Date   GLUCAP 246 (H) 05/26/2022   HGBA1C 11.2 (H) 05/22/2022    Latest Reference Range & Units 05/25/22 11:30 05/25/22 17:41 05/25/22 20:48 05/26/22 07:47 05/26/22 12:08  Glucose-Capillary 70 - 99 mg/dL 302 (H) 305 (H) 237 (H) 108 (H) 246 (H)  (H): Data is abnormally high Review of Glycemic Control  Diabetes history: type 2 Outpatient Diabetes medications: Semglee 40 units daily, Novolog 15 units TID if >200 mg/dl Current orders for Inpatient glycemic control: Semglee 25 units every HS, Novolog 0-15 units correction scale, Novolog 0-5 units HS scale, Novolog 3 units TID with meals.  Inpatient Diabetes Program Recommendations:   Noted that postprandial blood sugars continue to be greater than 180 mg/dl.  Recommend increasing Novolog meal coverage to 6 units TID if blood sugars continue to be elevated.   Harvel Ricks RN BSN CDE Diabetes Coordinator Pager: [phone]  8am-5pm

## 2022-05-27 DIAGNOSIS — S81809A Unspecified open wound, unspecified lower leg, initial encounter: Secondary | ICD-10-CM | POA: Diagnosis not present

## 2022-05-27 LAB — HEMOGLOBIN AND HEMATOCRIT, BLOOD
HCT: 25.9 % — ABNORMAL LOW (ref 39.0–52.0)
Hemoglobin: 8.1 g/dL — ABNORMAL LOW (ref 13.0–17.0)

## 2022-05-27 LAB — CBC
HCT: 21.7 % — ABNORMAL LOW (ref 39.0–52.0)
Hemoglobin: 6.9 g/dL — ABNORMAL LOW (ref 13.0–17.0)
MCH: 28.3 pg (ref 26.0–34.0)
MCHC: 31.8 g/dL (ref 30.0–36.0)
MCV: 88.9 fL (ref 80.0–100.0)
Platelets: 302 10*3/uL (ref 150–400)
RBC: 2.44 MIL/uL — ABNORMAL LOW (ref 4.22–5.81)
RDW: 14.7 % (ref 11.5–15.5)
WBC: 11.4 10*3/uL — ABNORMAL HIGH (ref 4.0–10.5)
nRBC: 0 % (ref 0.0–0.2)

## 2022-05-27 LAB — GLUCOSE, CAPILLARY
Glucose-Capillary: 238 mg/dL — ABNORMAL HIGH (ref 70–99)
Glucose-Capillary: 412 mg/dL — ABNORMAL HIGH (ref 70–99)
Glucose-Capillary: 399 mg/dL — ABNORMAL HIGH (ref 70–99)
Glucose-Capillary: 350 mg/dL — ABNORMAL HIGH (ref 70–99)
Glucose-Capillary: 411 mg/dL — ABNORMAL HIGH (ref 70–99)

## 2022-05-27 LAB — BASIC METABOLIC PANEL
Anion gap: 9 (ref 5–15)
BUN: 41 mg/dL — ABNORMAL HIGH (ref 8–23)
CO2: 20 mmol/L — ABNORMAL LOW (ref 22–32)
Calcium: 8.5 mg/dL — ABNORMAL LOW (ref 8.9–10.3)
Chloride: 102 mmol/L (ref 98–111)
Creatinine, Ser: 0.93 mg/dL (ref 0.61–1.24)
GFR, Estimated: >60 mL/min (ref >60)
Glucose, Bld: 272 mg/dL — ABNORMAL HIGH (ref 70–99)
Potassium: 4.2 mmol/L (ref 3.5–5.1)
Sodium: 131 mmol/L — ABNORMAL LOW (ref 135–145)

## 2022-05-27 LAB — TYPE AND SCREEN
ABO/RH(D): O NEG
Antibody Screen: NEGATIVE

## 2022-05-27 LAB — SODIUM, URINE, RANDOM: Sodium, Ur: 54 mmol/L

## 2022-05-27 LAB — OSMOLALITY: Osmolality: 298 mOsm/kg — ABNORMAL HIGH (ref 275–295)

## 2022-05-27 LAB — OSMOLALITY, URINE: Osmolality, Ur: 454 mOsm/kg (ref 300–900)

## 2022-05-27 LAB — PREPARE RBC (CROSSMATCH)

## 2022-05-27 MED ORDER — INSULIN ASPART 100 UNIT/ML IJ SOLN
0.0000 [IU] | Freq: Three times a day (TID) | INTRAMUSCULAR | Status: DC
  Administered 2022-05-27: 15 [IU] via SUBCUTANEOUS
  Administered 2022-05-27: 20 [IU] via SUBCUTANEOUS
  Administered 2022-05-28: 4 [IU] via SUBCUTANEOUS
  Administered 2022-05-28 – 2022-05-29 (×3): 7 [IU] via SUBCUTANEOUS
  Administered 2022-05-29: 11 [IU] via SUBCUTANEOUS
  Administered 2022-05-29: 15 [IU] via SUBCUTANEOUS
  Administered 2022-05-30: 4 [IU] via SUBCUTANEOUS
  Administered 2022-05-30 (×2): 7 [IU] via SUBCUTANEOUS
  Administered 2022-05-31: 11 [IU] via SUBCUTANEOUS
  Administered 2022-05-31: 4 [IU] via SUBCUTANEOUS
  Administered 2022-05-31 – 2022-06-01 (×2): 7 [IU] via SUBCUTANEOUS
  Administered 2022-06-01: 3 [IU] via SUBCUTANEOUS
  Administered 2022-06-01: 4 [IU] via SUBCUTANEOUS
  Administered 2022-06-02: 7 [IU] via SUBCUTANEOUS
  Administered 2022-06-02: 4 [IU] via SUBCUTANEOUS
  Administered 2022-06-03: 15 [IU] via SUBCUTANEOUS
  Administered 2022-06-03 – 2022-06-04 (×2): 4 [IU] via SUBCUTANEOUS
  Administered 2022-06-04: 7 [IU] via SUBCUTANEOUS
  Administered 2022-06-04: 3 [IU] via SUBCUTANEOUS
  Administered 2022-06-05 (×2): 4 [IU] via SUBCUTANEOUS
  Administered 2022-06-05: 7 [IU] via SUBCUTANEOUS
  Administered 2022-06-06: 4 [IU] via SUBCUTANEOUS
  Administered 2022-06-06: 7 [IU] via SUBCUTANEOUS
  Administered 2022-06-06: 3 [IU] via SUBCUTANEOUS
  Filled 2022-05-27 (×30): qty 1

## 2022-05-27 MED ORDER — INSULIN ASPART 100 UNIT/ML IJ SOLN
0.0000 [IU] | Freq: Every day | INTRAMUSCULAR | Status: DC
  Administered 2022-05-29: 3 [IU] via SUBCUTANEOUS
  Filled 2022-05-27: qty 1

## 2022-05-27 MED ORDER — INSULIN ASPART 100 UNIT/ML IJ SOLN
10.0000 [IU] | Freq: Once | INTRAMUSCULAR | Status: AC
  Administered 2022-05-27: 10 [IU] via SUBCUTANEOUS
  Filled 2022-05-27: qty 1

## 2022-05-27 MED ORDER — SODIUM CHLORIDE 0.9% IV SOLUTION: Freq: Once | INTRAVENOUS | Status: AC

## 2022-05-27 NOTE — Progress Notes (Signed)
PT Cancellation Note  Patient Details Name: [name]. MRN: [id number] DOB: 1955-12-25   Cancelled Treatment:    Reason Eval/Treat Not Completed: Medical issues which prohibited therapy   HgB 6.9 this am.  Awaiting blood transfusion.  Will continue as appropriate.  Chesley Noon 05/27/2022, 10:41 AM

## 2022-05-27 NOTE — Plan of Care (Signed)
  Problem: Clinical Measurements: Goal: Diagnostic test results will improve Outcome: Progressing Goal: Cardiovascular complication will be avoided Outcome: Progressing   Problem: Nutrition: Goal: Adequate nutrition will be maintained Outcome: Progressing   Problem: Elimination: Goal: Will not experience complications related to bowel motility Outcome: Progressing Goal: Will not experience complications related to urinary retention Outcome: Progressing   Problem: Pain Managment: Goal: General experience of comfort will improve Outcome: Progressing

## 2022-05-27 NOTE — Progress Notes (Signed)
CROSS COVER NOTE  NAME: [name]. MRN: [id number] DOB : March 18, 1956 ATTENDING PHYSICIAN: Emeterio Reeve, DO    Date of Service   05/27/2022   HPI/Events of Note   Notified of elevated CBG--> 411  Interventions   Assessment/Plan:  10U Novolog      To reach the provider On-Call:   7AM- 7PM see care teams to locate the attending and reach out to them via [email or web address]: [email or web address] 7PM-7AM contact night-coverage If you still have difficulty reaching the appropriate provider, please page the [facility] (Director on Call) for [facility] on amion for assistance  This document was prepared using Systems analyst and may include unintentional dictation errors.  Neomia Glass DNP, MBA, FNP-BC, PMHNP-BC Nurse Practitioner [facility] Pager [phone]

## 2022-05-27 NOTE — Progress Notes (Signed)
PROGRESS NOTE    [name].   OZD:[id number] DOB: 11/23/1955  DOA: 05/18/2022 Date of Service: 05/27/22 PCP: [name], MD     Brief Narrative / Hospital Course:  This is a pleasant 67 year old gentleman with a history of COPD, chronic respiratory failure on home O2, CAD status post CABG, insulin-dependent type 2 diabetes, OSA on BiPAP at night, BPH hypertension, hypothyroidism and paroxysmal atrial fibrillation on Eliquis and amiodarone who has peripheral arterial disease status post left BKA and is admitted to the hospital with concern for right ankle osteomyelitis after an injury in December 2023.  He was seen in consultation by podiatry, who recommends continuation of empiric IV antibiotics, underwent angiogram and angioplasty of the right anterior tibial artery with vascular surgery on 01/22. After consideration, opted for R BKA 01/24 given low likelihood of healing with non-surgical management. Awaiting SNF placement. Stopped abx 01/26 Friday. Planning SNF rehab placement after the weekend.   Consultants:  Podiatry Vascular Surgery   Procedures: 05/21/22 RLE Angiogram with angioplasty  05/24/22 R below-knee amputation       ASSESSMENT & PLAN:   Principal Problem:   Non-healing wound of lower extremity, initial encounter Active Problems:   Acute osteomyelitis of right foot (HCC)   PAD (peripheral artery disease) (HCC)   S/P BKA (below knee amputation), left (HCC)   AKI (acute kidney injury) ([place])   Hyperkalemia   Chronic anticoagulation   AF (paroxysmal atrial fibrillation) ([place])   Uncontrolled type 2 diabetes mellitus with hypoglycemia, with long-term current use of insulin (HCC)   OSA (obstructive sleep apnea)   Chronic respiratory failure with hypoxia (HCC)   COPD (chronic obstructive pulmonary disease) (HCC)   Chronic diastolic CHF (congestive heart failure) (HCC)   CAD with Hx of CABG   Essential hypertension   BPH (benign prostatic  hyperplasia)  Acute osteomyelitis of right foot and ankle (HCC) Nonhealing wound right ankle PAD s/p left BKA S/p angiogram with angioplasty 1/22 with vascular surgery S/p R BKA 05/24/22  D/c cefepime and vancomycin 05/25/22  SNF placement pending after the weekend  Anemia Chronic, worse now d/t likely myelosuppression/chronic illness + recent surgery 1 unit PRBC today, repeat HH pending   Hyperkalemia - resolved Monitor BMP   AKI (acute kidney injury) ([place]) - resolved Monitor BMP   Pseudohyponatremia d/t hyperglucemia Hyponatremia on am labs today Repeating BMP to confirm: Na 128 and Glc 139 --> corrects to 130 Restart gentle IV fluids and obtain serum Osm and Urine Na/Osm, TSH  Uncontrolled type 2 diabetes mellitus with hypoglycemia, with long-term current use of insulin ([place]) Blood sugar in the 300s on admission initially Continue home basal insulin Sliding scale coverage   AF (paroxysmal atrial fibrillation) (HCC) Chronic anticoagulation Continue amiodarone Restarted apixiban 01/23   COPD (chronic obstructive pulmonary disease) ([place]) OSA on home BiPAP qhs Chronic respiratory failure on home O2 Patient is at baseline Continue home Pulmicort and inhalers Continue BiPAP and O2 at night and anytime he is sleeping   CAD with Hx of CABG Continue aspirin, ezetimibe, Imdur, losartan   Chronic diastolic CHF (congestive heart failure) (HCC) Clinically euvolemic but did require 1500 mL IV fluids to maintain BP postoperatively holding Lasix, losartan given low BP --> restart when able   Postoperative hypotension - resolved Chronic Essential hypertension  Holding home antihypertensives for now given low BP after R BKA yesterday Hypotension responded to 1500 mL fluids and MAP appropriate, closely monitor VS and fluid status back on the floor  BPH (benign prostatic hyperplasia) Continue finasteride and tamsulosin     DVT prophylaxis: lovenox  Pertinent IV  fluids/nutrition: no continuous IV fluids  Central lines / invasive devices: none   Code Status: FULL CODE   Current Admission Status: inpatient   TOC needs / Dispo plan: will need SNF rehab Barriers to discharge / significant pending items: s/p BKA POD3, wil need SNF rehab after the weekend (today is Sunday)              Subjective / Brief ROS:  Pt feeling okay today, pain controlled, better than yesterday   Family Communication: none at this time     Objective Findings:  Vitals:   05/27/22 0733 05/27/22 0744 05/27/22 1349 05/27/22 1406  BP: 129/71  133/64 123/60  Pulse: 87  94 94  Resp: '16  18 18  '$ Temp: 98.8 F (37.1 C)  97.8 F (36.6 C) 98.8 F (37.1 C)  TempSrc:   Oral Oral  SpO2: 96% 94% 95% 95%  Weight:      Height:        Intake/Output Summary (Last 24 hours) at 05/27/2022 1611 Last data filed at 05/27/2022 1548 Gross per 24 hour  Intake 822.84 ml  Output 4250 ml  Net -3427.16 ml    Filed Weights   05/18/22 2150  Weight: 135.5 kg    Examination:  Physical Exam Constitutional:      Appearance: Normal appearance. He is obese. He is not ill-appearing.  Cardiovascular:     Rate and Rhythm: Normal rate and regular rhythm.  Pulmonary:     Effort: Pulmonary effort is normal.     Breath sounds: Normal breath sounds.  Abdominal:     Palpations: Abdomen is soft.  Musculoskeletal:     Comments: LLE BKA, RLE BKA  Skin:    General: Skin is dry.  Neurological:     General: No focal deficit present.     Mental Status: He is alert and oriented to person, place, and time.  Psychiatric:        Behavior: Behavior normal.        Thought Content: Thought content normal.          Scheduled Medications:   sodium chloride   Intravenous Once   amiodarone  200 mg Oral Daily   ascorbic acid  500 mg Oral BID   aspirin EC  81 mg Oral Daily   budesonide  0.5 mg Nebulization BID   clopidogrel  75 mg Oral Daily   cyanocobalamin  5,000 mcg Oral Daily    enoxaparin (LOVENOX) injection  0.5 mg/kg Subcutaneous Q24H   ezetimibe  10 mg Oral Daily   ferrous sulfate  325 mg Oral Q breakfast   finasteride  5 mg Oral Daily   gabapentin  300 mg Oral QHS   insulin aspart  0-20 Units Subcutaneous TID WC   insulin aspart  0-5 Units Subcutaneous QHS   insulin aspart  5 Units Subcutaneous TID WC   insulin glargine-yfgn  25 Units Subcutaneous QHS   isosorbide mononitrate  60 mg Oral Daily   levothyroxine  25 mcg Oral Q0600   multivitamin with minerals  1 tablet Oral Daily   nutrition supplement (JUVEN)  1 packet Oral BID BM   pramipexole  2 mg Oral QHS   primidone  250 mg Oral BID   Ensure Max Protein  11 oz Oral QHS   risperiDONE  0.5 mg Oral BID   tamsulosin  0.4 mg Oral Daily  zinc sulfate  220 mg Oral Daily    Continuous Infusions:  sodium chloride 75 mL/hr at 05/27/22 0308     PRN Medications:  acetaminophen **OR** acetaminophen, albuterol, fentaNYL (SUBLIMAZE) injection, fentaNYL (SUBLIMAZE) injection, HYDROcodone-acetaminophen, HYDROmorphone (DILAUDID) injection, HYDROmorphone (DILAUDID) injection, morphine injection, ondansetron **OR** ondansetron (ZOFRAN) IV, ondansetron (ZOFRAN) IV, traZODone  Antimicrobials from admission:  Anti-infectives (From admission, onward)    Start     Dose/Rate Route Frequency Ordered Stop   05/24/22 1000  vancomycin (VANCOREADY) IVPB 1500 mg/300 mL        1,500 mg 150 mL/hr over 120 Minutes Intravenous 120 min pre-op 05/23/22 1830 05/25/22 1459   05/21/22 1655  ciprofloxacin (CIPRO) IVPB  Status:  Discontinued        over 60 Minutes  Continuous PRN 05/21/22 1655 05/21/22 1737   05/21/22 1515  ciprofloxacin (CIPRO) IVPB 400 mg  Status:  Discontinued        400 mg 200 mL/hr over 60 Minutes Intravenous 60 min pre-op 05/21/22 1515 05/21/22 1736   05/19/22 1200  vancomycin (VANCOREADY) IVPB 1250 mg/250 mL  Status:  Discontinued        1,250 mg 166.7 mL/hr over 90 Minutes Intravenous Every 12 hours  05/19/22 0739 05/25/22 1511   05/19/22 0100  vancomycin (VANCOREADY) IVPB 500 mg/100 mL       See Hyperspace for full Linked Orders Report.   500 mg 100 mL/hr over 60 Minutes Intravenous  Once 05/18/22 2209 05/19/22 0241   05/18/22 2300  vancomycin (VANCOREADY) IVPB 2000 mg/400 mL       See Hyperspace for full Linked Orders Report.   2,000 mg 200 mL/hr over 120 Minutes Intravenous  Once 05/18/22 2209 05/19/22 0138   05/18/22 2300  ceFEPIme (MAXIPIME) 2 g in sodium chloride 0.9 % 100 mL IVPB  Status:  Discontinued        2 g 200 mL/hr over 30 Minutes Intravenous Every 8 hours 05/18/22 2209 05/25/22 1511   05/18/22 2210  vancomycin variable dose per unstable renal function (pharmacist dosing)  Status:  Discontinued         Does not apply See admin instructions 05/18/22 2211 05/20/22 0908   05/18/22 2145  metroNIDAZOLE (FLAGYL) tablet 500 mg        500 mg Oral Every 12 hours 05/18/22 2116 05/25/22 2144   05/18/22 2000  metroNIDAZOLE (FLAGYL) IVPB 500 mg  Status:  Discontinued        500 mg 100 mL/hr over 60 Minutes Intravenous  Once 05/18/22 1948 05/18/22 2144   05/18/22 2000  ceFEPIme (MAXIPIME) 2 g in sodium chloride 0.9 % 100 mL IVPB  Status:  Discontinued        2 g 200 mL/hr over 30 Minutes Intravenous  Once 05/18/22 1959 05/18/22 2209   05/18/22 2000  vancomycin (VANCOCIN) IVPB 1000 mg/200 mL premix  Status:  Discontinued        1,000 mg 200 mL/hr over 60 Minutes Intravenous  Once 05/18/22 1959 05/18/22 2209           Data Reviewed:  I have personally reviewed the following...  CBC: Recent Labs  Lab 05/23/22 0417 05/24/22 0328 05/24/22 1250 05/25/22 0446 05/26/22 0525 05/27/22 0506  WBC 10.0 11.2*  --  12.2* 14.3* 11.4*  HGB 10.1* 9.6* 9.9* 8.2* 7.3* 6.9*  HCT 32.6* 30.7* 31.4* 26.7* 22.7* 21.7*  MCV 90.6 89.5  --  91.1 89.0 88.9  PLT 291 316  --  302 306 302    Basic  Metabolic Panel: Recent Labs  Lab 05/24/22 0328 05/25/22 0446 05/26/22 0525 05/26/22 1146  05/27/22 0506  NA 132* 134* 129* 128* 131*  K 4.3 4.3 4.3 4.2 4.2  CL 101 104 102 100 102  CO2 23 22 21* 22 20*  GLUCOSE 257* 264* 102* 239* 272*  BUN 43* 46* 43* 38* 41*  CREATININE 1.00 0.99 0.88 0.99 0.93  CALCIUM 8.7* 8.7* 8.4* 8.3* 8.5*    GFR: Estimated Creatinine Clearance: 115.9 mL/min (by C-G formula based on SCr of 0.93 mg/dL). Liver Function Tests: No results for input(s): "AST", "ALT", "ALKPHOS", "BILITOT", "PROT", "ALBUMIN" in the last 168 hours.  No results for input(s): "LIPASE", "AMYLASE" in the last 168 hours. No results for input(s): "AMMONIA" in the last 168 hours. Coagulation Profile: No results for input(s): "INR", "PROTIME" in the last 168 hours.  Cardiac Enzymes: No results for input(s): "CKTOTAL", "CKMB", "CKMBINDEX", "TROPONINI" in the last 168 hours. BNP (last 3 results) No results for input(s): "PROBNP" in the last 8760 hours. HbA1C: No results for input(s): "HGBA1C" in the last 72 hours.  CBG: Recent Labs  Lab 05/26/22 1648 05/26/22 2112 05/27/22 0809 05/27/22 1130 05/27/22 1233  GLUCAP 446* 442* 238* 412* 399*    Lipid Profile: No results for input(s): "CHOL", "HDL", "LDLCALC", "TRIG", "CHOLHDL", "LDLDIRECT" in the last 72 hours. Thyroid Function Tests: Recent Labs    05/26/22 1145  TSH 2.407   Anemia Panel: No results for input(s): "VITAMINB12", "FOLATE", "FERRITIN", "TIBC", "IRON", "RETICCTPCT" in the last 72 hours. Most Recent Urinalysis On File:     Component Value Date/Time   COLORURINE YELLOW (A) 05/20/2022 0134   APPEARANCEUR CLEAR (A) 05/20/2022 0134   LABSPEC 1.013 05/20/2022 0134   PHURINE 6.0 05/20/2022 0134   GLUCOSEU 50 (A) 05/20/2022 0134   HGBUR NEGATIVE 05/20/2022 0134   BILIRUBINUR NEGATIVE 05/20/2022 0134   KETONESUR NEGATIVE 05/20/2022 0134   PROTEINUR 100 (A) 05/20/2022 0134   NITRITE NEGATIVE 05/20/2022 0134   LEUKOCYTESUR NEGATIVE 05/20/2022 0134   Sepsis  Labs: '@LABRCNTIP'$ (procalcitonin:4,lacticidven:4) Microbiology: Recent Results (from the past 240 hour(s))  Culture, blood (Routine x 2)     Status: None   Collection Time: 05/18/22  3:27 PM   Specimen: BLOOD  Result Value Ref Range Status   Specimen Description BLOOD RIGHT [facility]  Final   Special Requests   Final    BOTTLES DRAWN AEROBIC AND ANAEROBIC Blood Culture adequate volume   Culture   Final    NO GROWTH 5 DAYS Performed at [facility], [address] [place], [place] [address]    Report Status 05/23/2022 FINAL  Final  Culture, blood (Routine X 2) w Reflex to ID Panel     Status: None   Collection Time: 05/18/22 10:10 PM   Specimen: BLOOD  Result Value Ref Range Status   Specimen Description BLOOD BLOOD LEFT ARM  Final   Special Requests   Final    BOTTLES DRAWN AEROBIC AND ANAEROBIC Blood Culture adequate volume   Culture   Final    NO GROWTH 5 DAYS Performed at [facility], [address] [place], [place] [address]    Report Status 05/23/2022 FINAL  Final      Radiology Studies last 3 days: No results found.           LOS: 9 days       Emeterio Reeve, DO [facility] 05/27/2022, 4:11 PM    Dictation software may have been used to generate the above note. Typos  may occur and escape review in typed/dictated notes. Please contact Dr Sheppard Coil directly for clarity if needed.  Staff may message me via secure chat in [place]  but this may not receive an immediate response,  please page me for urgent matters!  If 7PM-7AM, please contact night coverage [email or web address]

## 2022-05-27 NOTE — Progress Notes (Signed)
Subjective: Interval History: none..   Objective: Vital signs in last 24 hours: Temp:  [97.7 F (36.5 C)-99.6 F (37.6 C)] 98.8 F (37.1 C) (01/28 0733) Pulse Rate:  [87-97] 87 (01/28 0733) Resp:  [16-20] 16 (01/28 0733) BP: (120-129)/(62-71) 129/71 (01/28 0733) SpO2:  [94 %-96 %] 94 % (01/28 0744) FiO2 (%):  [21 %] 21 % (01/28 0000)  Intake/Output from previous day: 01/27 0701 - 01/28 0700 In: 1020 [I.V.:1020] Out: 1850 [Urine:1850] Intake/Output this shift: Total I/O In: -  Out: 550 [Urine:550]  General appearance: no distress and sleeping on rounds Incision/Wound:  Lab Results: Recent Labs    05/26/22 0525 05/27/22 0506  WBC 14.3* 11.4*  HGB 7.3* 6.9*  HCT 22.7* 21.7*  PLT 306 302   BMET Recent Labs    05/26/22 1146 05/27/22 0506  NA 128* 131*  K 4.2 4.2  CL 100 102  CO2 22 20*  GLUCOSE 239* 272*  BUN 38* 41*  CREATININE 0.99 0.93  CALCIUM 8.3* 8.5*    Studies/Results: PERIPHERAL VASCULAR CATHETERIZATION  Result Date: 05/21/2022 See surgical note for result.  MR ANKLE RIGHT WO CONTRAST  Result Date: 05/18/2022 CLINICAL DATA:  Ankle pain, chronic, inflammatory arthritis suspected. Patient sent due to bone infection in right lateral malleolus. EXAM: MRI OF THE RIGHT ANKLE WITHOUT CONTRAST TECHNIQUE: Multiplanar, multisequence MR imaging of the ankle was performed. No intravenous contrast was administered. COMPARISON:  Radiographs dated February 11, 2022 FINDINGS: TENDONS Peroneal: Peroneal longus tendon intact. Peroneal brevis intact. Edema along the peroneus brevis and peroneus longus tendons consistent with tenosynovitis. Posteromedial: Posterior tibial tendon intact. Flexor hallucis longus tendon intact. Flexor digitorum longus tendon intact. Anterior: Tibialis anterior tendon intact. Extensor hallucis longus tendon intact Extensor digitorum longus tendon intact. Achilles: Intermediate intrasubstance signal of the distal Achilles tendon suggesting  tendinosis without tear. Plantar Fascia: Intact. LIGAMENTS Lateral: Anterior talofibular ligament intact. Calcaneofibular ligament intact. Posterior talofibular ligament intact. Anterior and posterior tibiofibular ligaments intact. Medial: Deltoid ligament intact. Spring ligament intact. CARTILAGE Ankle Joint: Small joint effusion. Normal ankle mortise. No chondral defect. Subtalar Joints/Sinus Tarsi: Degenerative changes with cartilage defect and subchondral cyst of the subtalar joint in the body of the talus. Normal sinus tarsi. Bones: Bone marrow edema of the lateral malleolus adjacent to the deep skin wound consistent with osteomyelitis. Soft Tissue: Generalized subcutaneous soft tissue edema about the medial and lateral aspect of the ankle/foot no drainable fluid collection or hematoma. Increased intra substance signal of the plantar muscles suggesting myopathy/myositis. Tarsal tunnel is normal. IMPRESSION: 1. Bone marrow edema of the lateral malleolus adjacent to the deep skin wound consistent with osteomyelitis. 2. Tenosynovitis of the peroneus brevis and peroneus longus tendons without evidence of tendon tear. 3. Generalized subcutaneous soft tissue edema about the medial and lateral aspect of the ankle/foot without drainable fluid collection or hematoma. 4. Increased intra substance signal of the plantar muscles suggesting myopathy/myositis. Electronically Signed   By: Keane Police D.O.   On: 05/18/2022 21:58   DG Chest 2 View  Result Date: 05/18/2022 CLINICAL DATA:  Suspected sepsis. EXAM: CHEST - 2 VIEW COMPARISON:  03/27/2022. FINDINGS: Unchanged fracture of the uppermost median sternotomy wire. Remaining wires appear intact. No focal airspace opacity. Stable postoperative changes of CABG. Normal heart size. Stable mediastinal contours with atherosclerotic calcifications and tortuosity of the aorta. No pleural effusion or pneumothorax. Visualized bones and upper abdomen are unremarkable. IMPRESSION:  No acute cardiopulmonary disease. Electronically Signed   By: Emmit Alexanders M.D.   On:  05/18/2022 16:05   CT Abdomen Pelvis W Contrast  Result Date: 05/03/2022 CLINICAL DATA:  Retroperitoneal hematoma. EXAM: CT ABDOMEN AND PELVIS WITH CONTRAST TECHNIQUE: Multidetector CT imaging of the abdomen and pelvis was performed using the standard protocol following bolus administration of intravenous contrast. RADIATION DOSE REDUCTION: This exam was performed according to the departmental dose-optimization program which includes automated exposure control, adjustment of the mA and/or kV according to patient size and/or use of iterative reconstruction technique. CONTRAST:  191m OMNIPAQUE IOHEXOL 350 MG/ML SOLN COMPARISON:  04/02/2022 FINDINGS: Lower chest: Unremarkable. Hepatobiliary: The liver shows diffusely decreased attenuation suggesting fat deposition. Calcified gallstones evident. No intrahepatic or extrahepatic biliary dilation. Pancreas: No focal mass lesion. No dilatation of the main duct. No intraparenchymal cyst. No peripancreatic edema. Spleen: No splenomegaly. No focal mass lesion. Adrenals/Urinary Tract: No adrenal nodule or mass. The subcapsular hematoma noted posteriorly in the right kidney measures 7.9 x 4.2 x 13.5 cm today. This compares to 9.5 x 5.2 x 13.3 cm previously. Mildly decreased perfusion to the right kidney is again noted. Inferior extra capsular extension again noted measuring 6.9 x 4.2 cm at the same level it was measured previously at 9.0 x 4.1 cm. As before, there are scattered pockets of gas in the subcapsular and retroperitoneal components of this process. Perinephric edema/strandy hemorrhage persists. Stable appearance of the left kidney with small exophytic cyst noted posteriorly. No hydroureteronephrosis. The urinary bladder appears normal for the degree of distention. Stomach/Bowel: Tiny hiatal hernia. Stomach otherwise unremarkable. Duodenum is normally positioned as is the  ligament of Treitz. No small bowel wall thickening. No small bowel dilatation. The terminal ileum is normal. The appendix is not well visualized, but there is no edema or inflammation in the region of the cecum. No gross colonic mass. No colonic wall thickening. Diverticular changes are noted in the left colon without evidence of diverticulitis. Vascular/Lymphatic: There is mild atherosclerotic calcification of the abdominal aorta without aneurysm. Scattered small lymph nodes are seen in the hepatoduodenal ligament and retroperitoneal soft tissues. No pelvic sidewall lymphadenopathy. Upper normal lymph nodes are seen in the right external iliac chain and right groin. Reproductive: The prostate gland and seminal vesicles are unremarkable. Other: No intraperitoneal free fluid. Musculoskeletal: No worrisome lytic or sclerotic osseous abnormality. IMPRESSION: 1. No substantial interval change in the right renal subcapsular hematoma with inferior extra capsular extension. As before, there are scattered pockets of gas in the subcapsular and retroperitoneal components of this process. Superinfection of the hematoma cannot be excluded. 2. Persistent marked mass-effect on the right kidney secondary to the subcapsular hematoma. Subtle differential decreased perfusion to the right kidney again noted. 3. Hepatic steatosis. 4. Cholelithiasis. 5. Left colonic diverticulosis without diverticulitis. 6. Tiny hiatal hernia. 7. Upper normal lymph nodes in the right external iliac chain and right groin. These are likely reactive. Continued attention on follow-up recommended. 8.  Aortic Atherosclerosis (ICD10-I70.0). Electronically Signed   By: EMisty StanleyM.D.   On: 05/03/2022 15:49   Anti-infectives: Anti-infectives (From admission, onward)    Start     Dose/Rate Route Frequency Ordered Stop   05/24/22 1000  vancomycin (VANCOREADY) IVPB 1500 mg/300 mL        1,500 mg 150 mL/hr over 120 Minutes Intravenous 120 min pre-op  05/23/22 1830 05/25/22 1459   05/21/22 1655  ciprofloxacin (CIPRO) IVPB  Status:  Discontinued        over 60 Minutes  Continuous PRN 05/21/22 1655 05/21/22 1737   05/21/22 1515  ciprofloxacin (CIPRO)  IVPB 400 mg  Status:  Discontinued        400 mg 200 mL/hr over 60 Minutes Intravenous 60 min pre-op 05/21/22 1515 05/21/22 1736   05/19/22 1200  vancomycin (VANCOREADY) IVPB 1250 mg/250 mL  Status:  Discontinued        1,250 mg 166.7 mL/hr over 90 Minutes Intravenous Every 12 hours 05/19/22 0739 05/25/22 1511   05/19/22 0100  vancomycin (VANCOREADY) IVPB 500 mg/100 mL       See Hyperspace for full Linked Orders Report.   500 mg 100 mL/hr over 60 Minutes Intravenous  Once 05/18/22 2209 05/19/22 0241   05/18/22 2300  vancomycin (VANCOREADY) IVPB 2000 mg/400 mL       See Hyperspace for full Linked Orders Report.   2,000 mg 200 mL/hr over 120 Minutes Intravenous  Once 05/18/22 2209 05/19/22 0138   05/18/22 2300  ceFEPIme (MAXIPIME) 2 g in sodium chloride 0.9 % 100 mL IVPB  Status:  Discontinued        2 g 200 mL/hr over 30 Minutes Intravenous Every 8 hours 05/18/22 2209 05/25/22 1511   05/18/22 2210  vancomycin variable dose per unstable renal function (pharmacist dosing)  Status:  Discontinued         Does not apply See admin instructions 05/18/22 2211 05/20/22 0908   05/18/22 2145  metroNIDAZOLE (FLAGYL) tablet 500 mg        500 mg Oral Every 12 hours 05/18/22 2116 05/25/22 2144   05/18/22 2000  metroNIDAZOLE (FLAGYL) IVPB 500 mg  Status:  Discontinued        500 mg 100 mL/hr over 60 Minutes Intravenous  Once 05/18/22 1948 05/18/22 2144   05/18/22 2000  ceFEPIme (MAXIPIME) 2 g in sodium chloride 0.9 % 100 mL IVPB  Status:  Discontinued        2 g 200 mL/hr over 30 Minutes Intravenous  Once 05/18/22 1959 05/18/22 2209   05/18/22 2000  vancomycin (VANCOCIN) IVPB 1000 mg/200 mL premix  Status:  Discontinued        1,000 mg 200 mL/hr over 60 Minutes Intravenous  Once 05/18/22 1959 05/18/22  2209       Assessment/Plan: s/p Procedure(s): AMPUTATION BELOW KNEE (Right) Status post right below the knee amputation-normal convalescence postop day 3.  Plan dressing removal on rounds tomorrow   LOS: 9 days   Bertram Savin 05/27/2022, 9:57 AM

## 2022-05-28 DIAGNOSIS — S81809A Unspecified open wound, unspecified lower leg, initial encounter: Secondary | ICD-10-CM | POA: Diagnosis not present

## 2022-05-28 LAB — TYPE AND SCREEN
ABO/RH(D): O NEG
Antibody Screen: NEGATIVE
Unit division: 0
Unit division: 0
Unit division: 0

## 2022-05-28 LAB — CBC
HCT: 25.5 % — ABNORMAL LOW (ref 39.0–52.0)
Hemoglobin: 7.9 g/dL — ABNORMAL LOW (ref 13.0–17.0)
MCH: 27.9 pg (ref 26.0–34.0)
MCHC: 31 g/dL (ref 30.0–36.0)
MCV: 90.1 fL (ref 80.0–100.0)
Platelets: 342 10*3/uL (ref 150–400)
RBC: 2.83 MIL/uL — ABNORMAL LOW (ref 4.22–5.81)
RDW: 14.9 % (ref 11.5–15.5)
WBC: 11.7 10*3/uL — ABNORMAL HIGH (ref 4.0–10.5)
nRBC: 0 % (ref 0.0–0.2)

## 2022-05-28 LAB — BASIC METABOLIC PANEL
Anion gap: 7 (ref 5–15)
BUN: 31 mg/dL — ABNORMAL HIGH (ref 8–23)
CO2: 24 mmol/L (ref 22–32)
Calcium: 8.7 mg/dL — ABNORMAL LOW (ref 8.9–10.3)
Chloride: 106 mmol/L (ref 98–111)
Creatinine, Ser: 0.84 mg/dL (ref 0.61–1.24)
GFR, Estimated: >60 mL/min (ref >60)
Glucose, Bld: 195 mg/dL — ABNORMAL HIGH (ref 70–99)
Potassium: 4.1 mmol/L (ref 3.5–5.1)
Sodium: 137 mmol/L (ref 135–145)

## 2022-05-28 LAB — BPAM RBC
Blood Product Expiration Date: 202402202359
Blood Product Expiration Date: 202402202359
Blood Product Expiration Date: 202403052359
ISSUE DATE / TIME: 202401281310
Unit Type and Rh: 5100
Unit Type and Rh: 9500
Unit Type and Rh: 9500

## 2022-05-28 LAB — GLUCOSE, CAPILLARY
Glucose-Capillary: 201 mg/dL — ABNORMAL HIGH (ref 70–99)
Glucose-Capillary: 194 mg/dL — ABNORMAL HIGH (ref 70–99)
Glucose-Capillary: 226 mg/dL — ABNORMAL HIGH (ref 70–99)
Glucose-Capillary: 202 mg/dL — ABNORMAL HIGH (ref 70–99)

## 2022-05-28 LAB — SURGICAL PATHOLOGY

## 2022-05-28 MED ORDER — INSULIN GLARGINE-YFGN 100 UNIT/ML ~~LOC~~ SOLN
15.0000 [IU] | Freq: Every day | SUBCUTANEOUS | Status: DC
  Administered 2022-05-28 – 2022-05-30 (×3): 15 [IU] via SUBCUTANEOUS
  Filled 2022-05-28 (×3): qty 0.15

## 2022-05-28 MED ORDER — INSULIN ASPART 100 UNIT/ML IJ SOLN
10.0000 [IU] | Freq: Three times a day (TID) | INTRAMUSCULAR | Status: DC
  Administered 2022-05-28 – 2022-06-01 (×13): 10 [IU] via SUBCUTANEOUS
  Filled 2022-05-28 (×13): qty 1

## 2022-05-28 NOTE — Progress Notes (Signed)
Patient refused cpap earlier. Stated only wanted to use o2 for sleep. Placed patient on 2 liters via nasal cannula

## 2022-05-28 NOTE — Progress Notes (Signed)
Progress Note    05/28/2022 11:33 AM 4 Days Post-Op  Subjective:  Patient is postop day 4 following a right below-knee amputation.  Several months ago he also underwent a left below-knee amputation.  He notes that he has been up out of bed to the chair with physical therapy today.  He endorses having good pain control with prescribed medications.      Vitals:   05/28/22 0700 05/28/22 0733  BP: 135/65   Pulse: 95   Resp: 18   Temp: 98.2 F (36.8 C)   SpO2: 98% 98%   Physical Exam: Cardiac:  RRR Lungs:  Clear throughout but diminished in the bases Incisions:  Right stump staples, C/D/I  Extremities:  Bilateral BKA's Abdomen:  Positive Bowel sounds, soft NT/ND Neurologic: AAOX3  CBC    Component Value Date/Time   WBC 11.7 (H) 05/28/2022 0822   RBC 2.83 (L) 05/28/2022 0822   HGB 7.9 (L) 05/28/2022 0822   HGB 10.7 (L) 05/15/2022 1440   HCT 25.5 (L) 05/28/2022 0822   HCT 33.4 (L) 05/15/2022 1440   PLT 342 05/28/2022 0822   PLT 394 05/15/2022 1440   MCV 90.1 05/28/2022 0822   MCV 89 05/15/2022 1440   MCV 96 07/12/2011 0919   MCH 27.9 05/28/2022 0822   MCHC 31.0 05/28/2022 0822   RDW 14.9 05/28/2022 0822   RDW 12.5 05/15/2022 1440   RDW 15.2 (H) 07/12/2011 0919   LYMPHSABS 1.0 05/18/2022 1527   LYMPHSABS 1.2 07/12/2011 0919   MONOABS 0.6 05/18/2022 1527   MONOABS 0.8 (H) 07/12/2011 0919   EOSABS 0.2 05/18/2022 1527   EOSABS 0.4 07/12/2011 0919   BASOSABS 0.1 05/18/2022 1527   BASOSABS 0.1 07/12/2011 0919    BMET    Component Value Date/Time   NA 137 05/28/2022 0822   NA 141 07/12/2011 0919   K 4.1 05/28/2022 0822   K 4.4 07/12/2011 0919   CL 106 05/28/2022 0822   CL 102 07/12/2011 0919   CO2 24 05/28/2022 0822   CO2 29 07/12/2011 0919   GLUCOSE 195 (H) 05/28/2022 0822   GLUCOSE 76 07/12/2011 0919   BUN 31 (H) 05/28/2022 0822   BUN 16 05/05/2013 1022   CREATININE 0.84 05/28/2022 0822   CREATININE 0.71 05/05/2013 1022   CALCIUM 8.7 (L) 05/28/2022 0822    CALCIUM 8.8 07/12/2011 0919   GFRNONAA >60 05/28/2022 0822   GFRNONAA >60 05/05/2013 1022   GFRAA >60 01/06/2020 1853   GFRAA >60 05/05/2013 1022    INR    Component Value Date/Time   INR 1.3 (H) 05/18/2022 1527     Intake/Output Summary (Last 24 hours) at 05/28/2022 1133 Last data filed at 05/28/2022 0919 Gross per 24 hour  Intake 1069.07 ml  Output 3150 ml  Net -2080.93 ml     Assessment/Plan:  67 y.o. male is s/p Rt BKA 4 Days Post-Op  POD #4 s/p R BKA   Patient notes that his pain is well-controlled with current medical regimen, continue with as needed use of pain medication Patient to continue to work with physical therapy, patient expresses interest in going to rehab at discharge.  I think this is a very good idea as he previously did not go to rehab and has definitely noticed some significant weakness and difficulty with transfers.  I believe that rehab will allow him to have a better quality of life once he is able to return home. Scant amount of shadowing drainage on postoperative dressing, no significant bleeding  noted.     Dressing changed today.  Reinforced the importance of keeping his knee straight to avoid contractures.       Per Vascular Surgery Plan for discharge to rehab tomorrow     DVT prophylaxis:  ASA 81 mg, Plavix 75 mg, Lovenox 67.5 mg   Drema Pry [facility] 05/28/2022 11:33 AM

## 2022-05-28 NOTE — Progress Notes (Signed)
Inpatient Rehab Admissions Coordinator:   Per [facility] request, pt was screened for CIR by Shann Medal, PT, DPT.  Note pt currently max assist to total assist for mobility with assist for ADLs.  This appears to be his documented baseline.  Insurance will not likely approve due to pt being at baseline and due to diagnosis.  He is not a candidate for CIR at this time.    Shann Medal, PT, DPT Admissions Coordinator [phone] 05/28/22  12:28 PM

## 2022-05-28 NOTE — Progress Notes (Signed)
Occupational Therapy Treatment Patient Details Name: [name]. MRN: [id number] DOB: [birth year] Today's Date: 05/28/2022   History of present illness Pt is a 67 y/o M admitted on 05/18/22 with concern for right ankle osteomyelitis after an injury in December 2023. Pt was seen in consultation by podiatry, who recommended continuation of empiric IV antibiotics, underwent angiogram & angioplasty of the right anterior tibial artery with vascular surgery on 01/22. After consideration, opted for & underwent R BKA 01/25 given low likelihood of healing with non-surgical management. PMH: COPD, chronic respiratory failure on home O2, CAD s/p CABG, IDDM2, OSA on bipap at night, BPH HTN, hypothyroidism, paroxysmal a-fib on eliquis & amiodarone, PAD, L BKA   OT comments  Upon entering the room, pt seated in recliner chair with no c/o pain and agreeable to OT intervention. Pt has been sitting up for 2 hours since PT session. OT discussing transfer back to bed and pt showing limited insight to deficits as he reports, " If you just give me the slideboard I can transfer myself" and OT reminding pt that before he was able to put his foot down for balance during transfer and he can no longer do that. He verbalized, " Well I feel like it is still there" with OT reminding him of the safety concerns.  OT did agree with pt to try lateral scoot transfer which required total A of 2 for transfer to the L back into bed. Sit >supine with max A of 2 to return to bed. OT continues to recommend SNF at discharge but if returning home pt would benefit from hospital bed to reduce caregiver burden and for safety.    Recommendations for follow up therapy are one component of a multi-disciplinary discharge planning process, led by the attending physician.  Recommendations may be updated based on patient status, additional functional criteria and insurance authorization.    Follow Up Recommendations  Skilled nursing-short term  rehab (<3 hours/day)     Assistance Recommended at Discharge Frequent or constant Supervision/Assistance  Patient can return home with the following  Two people to help with bathing/dressing/bathroom;Two people to help with walking and/or transfers;Help with stairs or ramp for entrance;Assistance with cooking/housework;Assist for transportation   Equipment Recommendations  Hospital bed       Precautions / Restrictions Precautions Precautions: Fall Restrictions Weight Bearing Restrictions: Yes RLE Weight Bearing: Non weight bearing LLE Weight Bearing: Non weight bearing Other Position/Activity Restrictions: S/p R BKA, hx L BKA       Mobility Bed Mobility Overal bed mobility: Needs Assistance         Sit to supine: Max assist, +2 for physical assistance        Transfers Overall transfer level: Needs assistance Equipment used: 2 person hand held assist Transfers: Bed to chair/wheelchair/BSC            Lateral/Scoot Transfers: +2 physical assistance, Total assist       Balance Overall balance assessment: Needs assistance Sitting-balance support: Feet supported, Bilateral upper extremity supported Sitting balance-Leahy Scale: Poor                                     ADL either performed or assessed with clinical judgement    Extremity/Trunk Assessment Upper Extremity Assessment Upper Extremity Assessment: Generalized weakness   Lower Extremity Assessment Lower Extremity Assessment: Generalized weakness        Vision Patient Visual Report:  No change from baseline            Cognition Arousal/Alertness: Awake/alert Behavior During Therapy: WFL for tasks assessed/performed Overall Cognitive Status: Within Functional Limits for tasks assessed                                 General Comments: Pt is oriented but pt shows limited insight to deficits related to functional transfer.                   Pertinent Vitals/  Pain       Pain Assessment Pain Assessment: No/denies pain         Frequency  Min 2X/week        Progress Toward Goals  OT Goals(current goals can now be found in the care plan section)  Progress towards OT goals: Progressing toward goals  Acute Rehab OT Goals Patient Stated Goal: to get stronger OT Goal Formulation: With patient Time For Goal Achievement: 06/08/22 Potential to Achieve Goals: Good  Plan Discharge plan remains appropriate;Frequency remains appropriate       AM-PAC OT "6 Clicks" Daily Activity     Outcome Measure   Help from another person eating meals?: None Help from another person taking care of personal grooming?: A Little Help from another person toileting, which includes using toliet, bedpan, or urinal?: Total Help from another person bathing (including washing, rinsing, drying)?: Total Help from another person to put on and taking off regular upper body clothing?: A Little Help from another person to put on and taking off regular lower body clothing?: Total 6 Click Score: 13    End of Session    OT Visit Diagnosis: Other abnormalities of gait and mobility (R26.89);Pain Pain - Right/Left: Right Pain - part of body: Knee;Leg   Activity Tolerance Patient tolerated treatment well   Patient Left in bed;with call bell/phone within reach;with nursing/sitter in room;with bed alarm set   Nurse Communication Mobility status        Time: 4497-5300 OT Time Calculation (min): 14 min  Charges: OT General Charges $OT Visit: 1 Visit OT Treatments $Therapeutic Activity: 8-22 mins  Darleen Crocker, MS, OTR/L , CBIS ascom [phone]  05/28/22, 3:59 PM

## 2022-05-28 NOTE — Care Management Important Message (Signed)
Important Message  Patient Details  Name: [name]. MRN: [id number] Date of Birth: 1956/01/11   Medicare Important Message Given:  Yes     Juliann Pulse A Yesha Muchow 05/28/2022, 2:41 PM

## 2022-05-28 NOTE — Progress Notes (Signed)
Physical Therapy Treatment Patient Details Name: [name]. MRN: [id number] DOB: October 20, 1955 Today's Date: 05/28/2022   History of Present Illness Pt is a 67 y/o M admitted on 05/18/22 with concern for right ankle osteomyelitis after an injury in December 2023. Pt was seen in consultation by podiatry, who recommended continuation of empiric IV antibiotics, underwent angiogram & angioplasty of the right anterior tibial artery with vascular surgery on 01/22. After consideration, opted for & underwent R BKA 01/25 given low likelihood of healing with non-surgical management. PMH: COPD, chronic respiratory failure on home O2, CAD s/p CABG, IDDM2, OSA on bipap at night, BPH HTN, hypothyroidism, paroxysmal a-fib on eliquis & amiodarone, PAD, L BKA    PT Comments    Pt in bed, ready for session.  RLE BKA ex and education.  He is agreeable to get OOB to recliner.  Uses rails of bed to pull up to sitting.  Generally steady with BUE support.  Right rail is lowered and he is able to back off bed into recliner with max a x 2 to assist.  He has difficulty with BUE placement to effectively transition into chair and pads are used to assist.  Positioned in chair for comfort.  Pt does not have available SNF beds and is not appropriate for CIR per their eval.  Pt and wife have chosen to discharge home at this time.  He has equipment needs in place and will benefit from HHPT/OT and any available aide services and supports.  If pt does not have a hospital bed it would be beneficial to him if he chooses.  Will discuss with TOC.  Pt was [place] lift transfer before admission and will need to continue with that level upon discharge.     Recommendations for follow up therapy are one component of a multi-disciplinary discharge planning process, led by the attending physician.  Recommendations may be updated based on patient status, additional functional criteria and insurance authorization.  Follow Up  Recommendations  Skilled nursing-short term rehab (<3 hours/day) Can patient physically be transported by private vehicle: No   Assistance Recommended at Discharge Frequent or constant Supervision/Assistance  Patient can return home with the following A lot of help with walking and/or transfers;A lot of help with bathing/dressing/bathroom;Assist for transportation;Help with stairs or ramp for entrance;Direct supervision/assist for financial management;Assistance with cooking/housework   Equipment Recommendations  Hospital bed    Recommendations for Other Services       Precautions / Restrictions Precautions Precautions: Fall Restrictions Weight Bearing Restrictions: Yes RLE Weight Bearing: Non weight bearing LLE Weight Bearing: Non weight bearing Other Position/Activity Restrictions: S/p R BKA, hx L BKA     Mobility  Bed Mobility Overal bed mobility: Needs Assistance Bed Mobility: Supine to Sit     Supine to sit: Min assist, Mod assist     General bed mobility comments: uses bedrails to pull up to sit    Transfers Overall transfer level: Needs assistance Equipment used: None Transfers: Bed to chair/wheelchair/BSC         Anterior-Posterior transfers: Max assist, +2 physical assistance, +2 safety/equipment   General transfer comment: should use mechaical lift back to bed given height difference between recliner and bed.    Ambulation/Gait                   Stairs             Wheelchair Mobility    Modified Rankin (Stroke Patients Only)  Balance Overall balance assessment: Needs assistance Sitting-balance support: Feet supported, Bilateral upper extremity supported Sitting balance-Leahy Scale: Poor                                      Cognition Arousal/Alertness: Awake/alert Behavior During Therapy: WFL for tasks assessed/performed Overall Cognitive Status: Within Functional Limits for tasks assessed                                           Exercises Other Exercises Other Exercises: RLE AROM with focus on R knee BKA ex    General Comments        Pertinent Vitals/Pain Pain Assessment Pain Assessment: Faces Faces Pain Scale: Hurts a little bit Pain Location: R limb Pain Descriptors / Indicators: Discomfort Pain Intervention(s): Limited activity within patient's tolerance, Monitored during session, Repositioned    Home Living                          Prior Function            PT Goals (current goals can now be found in the care plan section) Progress towards PT goals: Progressing toward goals    Frequency    7X/week      PT Plan Current plan remains appropriate    Co-evaluation              AM-PAC PT "6 Clicks" Mobility   Outcome Measure  Help needed turning from your back to your side while in a flat bed without using bedrails?: A Lot Help needed moving from lying on your back to sitting on the side of a flat bed without using bedrails?: A Lot Help needed moving to and from a bed to a chair (including a wheelchair)?: Total Help needed standing up from a chair using your arms (e.g., wheelchair or bedside chair)?: Total Help needed to walk in hospital room?: Total Help needed climbing 3-5 steps with a railing? : Total 6 Click Score: 8    End of Session Equipment Utilized During Treatment: Gait belt Activity Tolerance: Patient tolerated treatment well Patient left: in chair;with nursing/sitter in room Nurse Communication: Mobility status PT Visit Diagnosis: Muscle weakness (generalized) (M62.81);Other abnormalities of gait and mobility (R26.89);Pain Pain - Right/Left: Right Pain - part of body: Leg     Time: 6283-1517 PT Time Calculation (min) (ACUTE ONLY): 19 min  Charges:  $Therapeutic Exercise: 8-22 mins                   Chesley Noon, PTA 05/28/22, 2:41 PM

## 2022-05-28 NOTE — Progress Notes (Signed)
PROGRESS NOTE    [name].   FIE:[id number] DOB: 11/21/1955  DOA: 05/18/2022 Date of Service: 05/28/22 PCP: [name], MD     Brief Narrative / Hospital Course:  This is a pleasant 67 year old gentleman with a history of COPD, chronic respiratory failure on home O2, CAD status post CABG, insulin-dependent type 2 diabetes, OSA on BiPAP at night, BPH hypertension, hypothyroidism and paroxysmal atrial fibrillation on Eliquis and amiodarone who has peripheral arterial disease status post left BKA and is admitted to the hospital with concern for right ankle osteomyelitis after an injury in December 2023.  He was seen in consultation by podiatry, who recommends continuation of empiric IV antibiotics, underwent angiogram and angioplasty of the right anterior tibial artery with vascular surgery on 01/22. After consideration, opted for R BKA 01/24 given low likelihood of healing with non-surgical management. Awaiting SNF placement. Stopped abx 01/26 Friday. Planning SNF rehab placement after the weekend, however days are used up and cannot afford out of pocket, confirming DME and plan for discharge home 01/30  Consultants:  Podiatry Vascular Surgery   Procedures: 05/21/22 RLE Angiogram with angioplasty  05/24/22 R below-knee amputation       ASSESSMENT & PLAN:   Principal Problem:   Non-healing wound of lower extremity, initial encounter Active Problems:   Acute osteomyelitis of right foot ([place])   PAD (peripheral artery disease) (HCC)   S/P BKA (below knee amputation), left (HCC)   AKI (acute kidney injury) ([place])   Hyperkalemia   Chronic anticoagulation   AF (paroxysmal atrial fibrillation) ([place])   Uncontrolled type 2 diabetes mellitus with hypoglycemia, with long-term current use of insulin (HCC)   OSA (obstructive sleep apnea)   Chronic respiratory failure with hypoxia (HCC)   COPD (chronic obstructive pulmonary disease) (HCC)   Chronic diastolic CHF  (congestive heart failure) ([place])   CAD with Hx of CABG   Essential hypertension   BPH (benign prostatic hyperplasia)  Acute osteomyelitis of right foot and ankle (HCC) Nonhealing wound right ankle PAD s/p left BKA S/p angiogram with angioplasty 1/22 with vascular surgery S/p R BKA 05/24/22  D/c cefepime and vancomycin 05/25/22  Plan home w/ HH  Anemia Chronic, worse now d/t likely myelosuppression/chronic illness + recent surgery 1 unit PRBC yesterday, repeat HH improved   Hyperkalemia - resolved Monitor BMP   AKI (acute kidney injury) ([place]) - resolved Monitor BMP   Pseudohyponatremia d/t hyperglycemia Monitor BMP  Uncontrolled type 2 diabetes mellitus with hypoglycemia, with long-term current use of insulin (HCC) Blood sugar in the 300s on admission initially Continue home basal insulin - basal increased Sliding scale coverage increased    AF (paroxysmal atrial fibrillation) (HCC) Chronic anticoagulation Continue amiodarone Restarted apixiban 01/23   COPD (chronic obstructive pulmonary disease) (HCC) OSA on home BiPAP qhs Chronic respiratory failure on home O2 Patient is at baseline Continue home Pulmicort and inhalers Continue BiPAP and O2 at night and anytime he is sleeping   CAD with Hx of CABG Continue aspirin, ezetimibe, Imdur   Chronic diastolic CHF (congestive heart failure) (HCC) Clinically euvolemic but did require 1500 mL IV fluids to maintain BP postoperatively holding Lasix, losartan given low BP --> restart when able   Postoperative hypotension - resolved Chronic Essential hypertension  Holding home antihypertensives for now given low BP after R BKA and anemia   BPH (benign prostatic hyperplasia) Continue finasteride and tamsulosin     DVT prophylaxis: lovenox  Pertinent IV fluids/nutrition: no continuous IV fluids  Central  lines / invasive devices: none   Code Status: FULL CODE   Current Admission Status: inpatient   TOC needs / Dispo  plan: will need SNF rehab Barriers to discharge / significant pending items: s/p BKA POD4, wil need home resources confirmed and transport home, anticipate d/c tomorrow 01/30             Subjective / Brief ROS:  Pt feeling okay today, pain controlled, better than yesterday   Family Communication: none at this time     Objective Findings:  Vitals:   05/27/22 1633 05/28/22 0034 05/28/22 0700 05/28/22 0733  BP: 120/66 127/62 135/65   Pulse: 91 96 95   Resp: '20 18 18   '$ Temp: 98.3 F (36.8 C) (!) 97.5 F (36.4 C) 98.2 F (36.8 C)   TempSrc: Oral  Oral   SpO2: 98% 98% 98% 98%  Weight:      Height:        Intake/Output Summary (Last 24 hours) at 05/28/2022 1553 Last data filed at 05/28/2022 0919 Gross per 24 hour  Intake 1069.07 ml  Output 1850 ml  Net -780.93 ml   Filed Weights   05/18/22 2150  Weight: 135.5 kg    Examination:  Physical Exam Constitutional:      Appearance: Normal appearance. He is obese. He is not ill-appearing.  Cardiovascular:     Rate and Rhythm: Normal rate and regular rhythm.  Pulmonary:     Effort: Pulmonary effort is normal.     Breath sounds: Normal breath sounds.  Abdominal:     Palpations: Abdomen is soft.  Musculoskeletal:     Comments: LLE BKA, RLE BKA  Skin:    General: Skin is dry.  Neurological:     General: No focal deficit present.     Mental Status: He is alert and oriented to person, place, and time.  Psychiatric:        Behavior: Behavior normal.        Thought Content: Thought content normal.          Scheduled Medications:   amiodarone  200 mg Oral Daily   ascorbic acid  500 mg Oral BID   aspirin EC  81 mg Oral Daily   budesonide  0.5 mg Nebulization BID   clopidogrel  75 mg Oral Daily   cyanocobalamin  5,000 mcg Oral Daily   enoxaparin (LOVENOX) injection  0.5 mg/kg Subcutaneous Q24H   ezetimibe  10 mg Oral Daily   ferrous sulfate  325 mg Oral Q breakfast   finasteride  5 mg Oral Daily    gabapentin  300 mg Oral QHS   insulin aspart  0-20 Units Subcutaneous TID WC   insulin aspart  0-5 Units Subcutaneous QHS   insulin aspart  10 Units Subcutaneous TID WC   insulin glargine-yfgn  15 Units Subcutaneous Daily   insulin glargine-yfgn  25 Units Subcutaneous QHS   isosorbide mononitrate  60 mg Oral Daily   levothyroxine  25 mcg Oral Q0600   multivitamin with minerals  1 tablet Oral Daily   nutrition supplement (JUVEN)  1 packet Oral BID BM   pramipexole  2 mg Oral QHS   primidone  250 mg Oral BID   Ensure Max Protein  11 oz Oral QHS   risperiDONE  0.5 mg Oral BID   tamsulosin  0.4 mg Oral Daily   zinc sulfate  220 mg Oral Daily    Continuous Infusions:  sodium chloride 75 mL/hr at 05/28/22 [phone]  PRN Medications:  acetaminophen **OR** acetaminophen, albuterol, fentaNYL (SUBLIMAZE) injection, fentaNYL (SUBLIMAZE) injection, HYDROcodone-acetaminophen, HYDROmorphone (DILAUDID) injection, HYDROmorphone (DILAUDID) injection, morphine injection, ondansetron **OR** ondansetron (ZOFRAN) IV, ondansetron (ZOFRAN) IV, traZODone  Antimicrobials from admission:  Anti-infectives (From admission, onward)    Start     Dose/Rate Route Frequency Ordered Stop   05/24/22 1000  vancomycin (VANCOREADY) IVPB 1500 mg/300 mL        1,500 mg 150 mL/hr over 120 Minutes Intravenous 120 min pre-op 05/23/22 1830 05/25/22 1459   05/21/22 1655  ciprofloxacin (CIPRO) IVPB  Status:  Discontinued        over 60 Minutes  Continuous PRN 05/21/22 1655 05/21/22 1737   05/21/22 1515  ciprofloxacin (CIPRO) IVPB 400 mg  Status:  Discontinued        400 mg 200 mL/hr over 60 Minutes Intravenous 60 min pre-op 05/21/22 1515 05/21/22 1736   05/19/22 1200  vancomycin (VANCOREADY) IVPB 1250 mg/250 mL  Status:  Discontinued        1,250 mg 166.7 mL/hr over 90 Minutes Intravenous Every 12 hours 05/19/22 0739 05/25/22 1511   05/19/22 0100  vancomycin (VANCOREADY) IVPB 500 mg/100 mL       See Hyperspace for full  Linked Orders Report.   500 mg 100 mL/hr over 60 Minutes Intravenous  Once 05/18/22 2209 05/19/22 0241   05/18/22 2300  vancomycin (VANCOREADY) IVPB 2000 mg/400 mL       See Hyperspace for full Linked Orders Report.   2,000 mg 200 mL/hr over 120 Minutes Intravenous  Once 05/18/22 2209 05/19/22 0138   05/18/22 2300  ceFEPIme (MAXIPIME) 2 g in sodium chloride 0.9 % 100 mL IVPB  Status:  Discontinued        2 g 200 mL/hr over 30 Minutes Intravenous Every 8 hours 05/18/22 2209 05/25/22 1511   05/18/22 2210  vancomycin variable dose per unstable renal function (pharmacist dosing)  Status:  Discontinued         Does not apply See admin instructions 05/18/22 2211 05/20/22 0908   05/18/22 2145  metroNIDAZOLE (FLAGYL) tablet 500 mg        500 mg Oral Every 12 hours 05/18/22 2116 05/25/22 2144   05/18/22 2000  metroNIDAZOLE (FLAGYL) IVPB 500 mg  Status:  Discontinued        500 mg 100 mL/hr over 60 Minutes Intravenous  Once 05/18/22 1948 05/18/22 2144   05/18/22 2000  ceFEPIme (MAXIPIME) 2 g in sodium chloride 0.9 % 100 mL IVPB  Status:  Discontinued        2 g 200 mL/hr over 30 Minutes Intravenous  Once 05/18/22 1959 05/18/22 2209   05/18/22 2000  vancomycin (VANCOCIN) IVPB 1000 mg/200 mL premix  Status:  Discontinued        1,000 mg 200 mL/hr over 60 Minutes Intravenous  Once 05/18/22 1959 05/18/22 2209           Data Reviewed:  I have personally reviewed the following...  CBC: Recent Labs  Lab 05/24/22 0328 05/24/22 1250 05/25/22 0446 05/26/22 0525 05/27/22 0506 05/27/22 1743 05/28/22 0822  WBC 11.2*  --  12.2* 14.3* 11.4*  --  11.7*  HGB 9.6*   < > 8.2* 7.3* 6.9* 8.1* 7.9*  HCT 30.7*   < > 26.7* 22.7* 21.7* 25.9* 25.5*  MCV 89.5  --  91.1 89.0 88.9  --  90.1  PLT 316  --  302 306 302  --  342   < > = values in this interval  not displayed.   Basic Metabolic Panel: Recent Labs  Lab 05/25/22 0446 05/26/22 0525 05/26/22 1146 05/27/22 0506 05/28/22 0822  NA 134* 129*  128* 131* 137  K 4.3 4.3 4.2 4.2 4.1  CL 104 102 100 102 106  CO2 22 21* 22 20* 24  GLUCOSE 264* 102* 239* 272* 195*  BUN 46* 43* 38* 41* 31*  CREATININE 0.99 0.88 0.99 0.93 0.84  CALCIUM 8.7* 8.4* 8.3* 8.5* 8.7*   GFR: Estimated Creatinine Clearance: 128.3 mL/min (by C-G formula based on SCr of 0.84 mg/dL). Liver Function Tests: No results for input(s): "AST", "ALT", "ALKPHOS", "BILITOT", "PROT", "ALBUMIN" in the last 168 hours.  No results for input(s): "LIPASE", "AMYLASE" in the last 168 hours. No results for input(s): "AMMONIA" in the last 168 hours. Coagulation Profile: No results for input(s): "INR", "PROTIME" in the last 168 hours.  Cardiac Enzymes: No results for input(s): "CKTOTAL", "CKMB", "CKMBINDEX", "TROPONINI" in the last 168 hours. BNP (last 3 results) No results for input(s): "PROBNP" in the last 8760 hours. HbA1C: No results for input(s): "HGBA1C" in the last 72 hours.  CBG: Recent Labs  Lab 05/27/22 1233 05/27/22 1702 05/27/22 2118 05/28/22 0804 05/28/22 1214  GLUCAP 399* 350* 411* 201* 194*   Lipid Profile: No results for input(s): "CHOL", "HDL", "LDLCALC", "TRIG", "CHOLHDL", "LDLDIRECT" in the last 72 hours. Thyroid Function Tests: Recent Labs    05/26/22 1145  TSH 2.407   Anemia Panel: No results for input(s): "VITAMINB12", "FOLATE", "FERRITIN", "TIBC", "IRON", "RETICCTPCT" in the last 72 hours. Most Recent Urinalysis On File:     Component Value Date/Time   COLORURINE YELLOW (A) 05/20/2022 0134   APPEARANCEUR CLEAR (A) 05/20/2022 0134   LABSPEC 1.013 05/20/2022 0134   PHURINE 6.0 05/20/2022 0134   GLUCOSEU 50 (A) 05/20/2022 0134   HGBUR NEGATIVE 05/20/2022 0134   BILIRUBINUR NEGATIVE 05/20/2022 0134   KETONESUR NEGATIVE 05/20/2022 0134   PROTEINUR 100 (A) 05/20/2022 0134   NITRITE NEGATIVE 05/20/2022 0134   LEUKOCYTESUR NEGATIVE 05/20/2022 0134   Sepsis Labs: '@LABRCNTIP'$ (procalcitonin:4,lacticidven:4) Microbiology: Recent Results  (from the past 240 hour(s))  Culture, blood (Routine X 2) w Reflex to ID Panel     Status: None   Collection Time: 05/18/22 10:10 PM   Specimen: BLOOD  Result Value Ref Range Status   Specimen Description BLOOD BLOOD LEFT ARM  Final   Special Requests   Final    BOTTLES DRAWN AEROBIC AND ANAEROBIC Blood Culture adequate volume   Culture   Final    NO GROWTH 5 DAYS Performed at [name], [address], [place], [place] [address]    Report Status 05/23/2022 FINAL  Final      Radiology Studies last 3 days: No results found.           LOS: 10 days       Emeterio Reeve, DO [facility] 05/28/2022, 3:53 PM    Dictation software may have been used to generate the above note. Typos may occur and escape review in typed/dictated notes. Please contact Dr Sheppard Coil directly for clarity if needed.  Staff may message me via secure chat in [place]  but this may not receive an immediate response,  please page me for urgent matters!  If 7PM-7AM, please contact night coverage [email or web address]

## 2022-05-28 NOTE — Plan of Care (Signed)
  Problem: Health Behavior/Discharge Planning: Goal: Ability to manage health-related needs will improve Outcome: Progressing   Problem: Clinical Measurements: Goal: Diagnostic test results will improve Outcome: Progressing Goal: Cardiovascular complication will be avoided Outcome: Progressing   Problem: Activity: Goal: Risk for activity intolerance will decrease Outcome: Progressing   Problem: Nutrition: Goal: Adequate nutrition will be maintained Outcome: Progressing   Problem: Pain Managment: Goal: General experience of comfort will improve Outcome: Progressing

## 2022-05-28 NOTE — TOC Progression Note (Addendum)
Transition of Care (TOC) - Progression Note    Patient Details  Name: [name]. MRN: [id number] Date of Birth: [birth year]  Transition [facility] CM/SW [place], LCSW Phone Number: 05/28/2022, 8:26 AM  Clinical Narrative: Only one bed offer this morning in [place]. Many SNF's have not responded yet. Sent therapy notes to try and trigger responses.    11:36 am: This CSW is familiar with patient from previous admissions. He went to [facility] 4/14-5/4 then admitted to the hospital 6/1-7/17. He then went to [facility] 7/17-10/9. At that time he had used up all 100 of his SNF days. Since then, patient has come back to the hospital 10/15-10/21, 11/16-11/24, 11/27-28, and then again on 1/19-present. He has not had a 60-day wellness period to start his SNF days back at 0. CSW checked with 4 SNF's to confirm. MD has been updated. Left voicemail for wife.  12:18 pm: Updated patient. He expressed understanding.  2:45 pm: Received call back from wife. Provided update. She expressed understanding and is aware he may discharge tomorrow. She does not want EMS transport home due to cost. She might have someone to help her get him into the house. Encouraged her to go ahead and call them. PT recommending a hospital bed but wife said there is no room to put one. She will notify the home health agency or his PCP if that changes.  Expected Discharge Plan: Home/Self Care Barriers to Discharge: Continued Medical Work up  Expected Discharge Plan and Services   Discharge Planning Services: CM Consult   Living arrangements for the past 2 months: [place]: [place] Determinants of Health (SDOH) Interventions SDOH Screenings   Food Insecurity: No Food Insecurity (05/19/2022)  Housing: Low Risk  (05/19/2022)  Transportation Needs: No Transportation Needs (05/19/2022)  Recent Concern:  Transportation Needs - Unmet Transportation Needs (03/21/2022)  Utilities: Not At Risk (05/19/2022)  Depression (PHQ2-9): Low Risk  (11/21/2021)  Tobacco Use: Medium Risk (05/25/2022)    Readmission Risk Interventions    02/13/2022    4:57 PM 09/29/2021   11:12 AM  Readmission Risk Prevention Plan  Transportation Screening Complete Complete  PCP or Specialist Appt within 3-5 Days  Complete  HRI or [place]  Complete  Social Work Consult for [place] Planning/Counseling  Complete  Palliative Care Screening  Not Applicable  Medication Review Press photographer) Complete Complete  PCP or Specialist appointment within 3-5 days of discharge Complete   HRI or [place] Complete   SW Recovery Care/Counseling Consult Complete   [place] Not Applicable

## 2022-05-28 NOTE — Inpatient Diabetes Management (Signed)
Inpatient Diabetes Program Recommendations  AACE/ADA: New Consensus Statement on Inpatient Glycemic Control   Target Ranges:  Prepandial:   less than 140 mg/dL      [place] postprandial:   less than 180 mg/dL (1-2 hours)      Critically ill patients:  140 - 180 mg/dL    Latest Reference Range & Units 05/27/22 08:09 05/27/22 11:30 05/27/22 12:33 05/27/22 17:02 05/27/22 21:18 05/28/22 08:04  Glucose-Capillary 70 - 99 mg/dL 238 (H) 412 (H) 399 (H) 350 (H) 411 (H) 201 (H)    Review of Glycemic Control  Diabetes history: DM2 Outpatient Diabetes medications: Semglee 40 units QAM, Novolog 15 units TID with meals Current orders for Inpatient glycemic control: Semglee 25 units QHS, Novolog 5 units TID with meals, Novolog 0-20 units TID with meals, Novolog 0-5 units QHS  Inpatient Diabetes Program Recommendations:    Insulin: Glucose 201-411 mg/dl over past 24 hours.  Please consider ordering Semglee 15 units QAM (in addition to Semglee 25 units QHS) and increase meal coverage to Novolog 10 units TID with meals.  Thanks, Barnie Alderman, RN, MSN, [place] Diabetes Coordinator Inpatient Diabetes Program [phone] (Team Pager from 8am to [place])

## 2022-05-29 ENCOUNTER — Ambulatory Visit: Admission: RE | Admit: 2022-05-29 | Payer: Medicare Other | Source: Ambulatory Visit | Admitting: Vascular Surgery

## 2022-05-29 ENCOUNTER — Telehealth (INDEPENDENT_AMBULATORY_CARE_PROVIDER_SITE_OTHER): Payer: Self-pay | Admitting: Vascular Surgery

## 2022-05-29 ENCOUNTER — Encounter: Admission: RE | Payer: Self-pay | Source: Ambulatory Visit

## 2022-05-29 DIAGNOSIS — S81809A Unspecified open wound, unspecified lower leg, initial encounter: Secondary | ICD-10-CM | POA: Diagnosis not present

## 2022-05-29 DIAGNOSIS — I70299 Other atherosclerosis of native arteries of extremities, unspecified extremity: Secondary | ICD-10-CM

## 2022-05-29 LAB — CBC
HCT: 24.8 % — ABNORMAL LOW (ref 39.0–52.0)
Hemoglobin: 7.6 g/dL — ABNORMAL LOW (ref 13.0–17.0)
MCH: 27.6 pg (ref 26.0–34.0)
MCHC: 30.6 g/dL (ref 30.0–36.0)
MCV: 90.2 fL (ref 80.0–100.0)
Platelets: 367 10*3/uL (ref 150–400)
RBC: 2.75 MIL/uL — ABNORMAL LOW (ref 4.22–5.81)
RDW: 15 % (ref 11.5–15.5)
WBC: 11.2 10*3/uL — ABNORMAL HIGH (ref 4.0–10.5)
nRBC: 0 % (ref 0.0–0.2)

## 2022-05-29 LAB — BASIC METABOLIC PANEL
Anion gap: 3 — ABNORMAL LOW (ref 5–15)
BUN: 30 mg/dL — ABNORMAL HIGH (ref 8–23)
CO2: 25 mmol/L (ref 22–32)
Calcium: 8.3 mg/dL — ABNORMAL LOW (ref 8.9–10.3)
Chloride: 106 mmol/L (ref 98–111)
Creatinine, Ser: 0.77 mg/dL (ref 0.61–1.24)
GFR, Estimated: >60 mL/min (ref >60)
Glucose, Bld: 211 mg/dL — ABNORMAL HIGH (ref 70–99)
Potassium: 3.9 mmol/L (ref 3.5–5.1)
Sodium: 134 mmol/L — ABNORMAL LOW (ref 135–145)

## 2022-05-29 LAB — GLUCOSE, CAPILLARY
Glucose-Capillary: 214 mg/dL — ABNORMAL HIGH (ref 70–99)
Glucose-Capillary: 267 mg/dL — ABNORMAL HIGH (ref 70–99)
Glucose-Capillary: 300 mg/dL — ABNORMAL HIGH (ref 70–99)
Glucose-Capillary: 304 mg/dL — ABNORMAL HIGH (ref 70–99)
Glucose-Capillary: 282 mg/dL — ABNORMAL HIGH (ref 70–99)

## 2022-05-29 SURGERY — LOWER EXTREMITY ANGIOGRAPHY
Anesthesia: Moderate Sedation | Site: Leg Lower | Laterality: Right

## 2022-05-29 MED ORDER — APIXABAN 5 MG PO TABS
5.0000 mg | ORAL_TABLET | Freq: Two times a day (BID) | ORAL | Status: DC
  Administered 2022-05-29 – 2022-06-06 (×17): 5 mg via ORAL
  Filled 2022-05-29 (×17): qty 1

## 2022-05-29 MED ORDER — RISPERIDONE 0.5 MG PO TABS: 0.2500 mg | ORAL_TABLET | Freq: Two times a day (BID) | ORAL | 0 refills | Status: DC

## 2022-05-29 MED ORDER — JUVEN PO PACK: 1.0000 | PACK | Freq: Two times a day (BID) | ORAL | 0 refills | Status: DC

## 2022-05-29 MED ORDER — HYDROCODONE-ACETAMINOPHEN 5-325 MG PO TABS: 1.0000 | ORAL_TABLET | Freq: Four times a day (QID) | ORAL | 0 refills | Status: AC | PRN

## 2022-05-29 MED ORDER — FUROSEMIDE 20 MG PO TABS: ORAL_TABLET | ORAL | 0 refills | Status: DC

## 2022-05-29 MED ORDER — ENSURE MAX PROTEIN PO LIQD: 11.0000 [oz_av] | Freq: Every day | ORAL | 0 refills | Status: AC

## 2022-05-29 MED ORDER — ZINC SULFATE 220 (50 ZN) MG PO CAPS: 220.0000 mg | ORAL_CAPSULE | Freq: Every day | ORAL | Status: DC

## 2022-05-29 MED ORDER — LOSARTAN POTASSIUM 25 MG PO TABS
25.0000 mg | ORAL_TABLET | Freq: Every day | ORAL | Status: DC
  Administered 2022-05-29 – 2022-06-06 (×9): 25 mg via ORAL
  Filled 2022-05-29 (×9): qty 1

## 2022-05-29 MED ORDER — ACETAMINOPHEN 325 MG PO TABS: 650.0000 mg | ORAL_TABLET | Freq: Four times a day (QID) | ORAL | Status: DC | PRN

## 2022-05-29 MED ORDER — LOSARTAN POTASSIUM 25 MG PO TABS: 25.0000 mg | ORAL_TABLET | Freq: Every day | ORAL | 0 refills | Status: DC

## 2022-05-29 MED ORDER — ONDANSETRON HCL 4 MG PO TABS: 4.0000 mg | ORAL_TABLET | Freq: Four times a day (QID) | ORAL | 0 refills | Status: DC | PRN

## 2022-05-29 MED ORDER — INSULIN GLARGINE 100 UNIT/ML ~~LOC~~ SOLN: SUBCUTANEOUS | 11 refills | Status: DC

## 2022-05-29 NOTE — Progress Notes (Signed)
Progress Note    05/29/2022 9:48 AM 5 Days Post-Op  Subjective:  Patient is postop day 4 following a right below-knee amputation.  Several months ago he also underwent a left below-knee amputation.  He notes that he has been up out of bed to the chair with physical therapy today.  He endorses having good pain control with prescribed medications.  No complaints overnight. Vital all remain stable.   Vitals:   05/29/22 0730 05/29/22 0801  BP: (!) 150/72   Pulse: 92   Resp: 18   Temp: 98.9 F (37.2 C)   SpO2: 96% 96%   Physical Exam: Cardiac:  RRR  Lungs:  Clear throughout but diminished in the bases  Incisions:  Right stump staples, C/D/I   Extremities:  Bilateral BKA's Healing well. Staples in place without drainage today.  Abdomen:   Positive Bowel sounds, soft NT/ND  Neurologic: AAOX3  CBC    Component Value Date/Time   WBC 11.2 (H) 05/29/2022 0237   RBC 2.75 (L) 05/29/2022 0237   HGB 7.6 (L) 05/29/2022 0237   HGB 10.7 (L) 05/15/2022 1440   HCT 24.8 (L) 05/29/2022 0237   HCT 33.4 (L) 05/15/2022 1440   PLT 367 05/29/2022 0237   PLT 394 05/15/2022 1440   MCV 90.2 05/29/2022 0237   MCV 89 05/15/2022 1440   MCV 96 07/12/2011 0919   MCH 27.6 05/29/2022 0237   MCHC 30.6 05/29/2022 0237   RDW 15.0 05/29/2022 0237   RDW 12.5 05/15/2022 1440   RDW 15.2 (H) 07/12/2011 0919   LYMPHSABS 1.0 05/18/2022 1527   LYMPHSABS 1.2 07/12/2011 0919   MONOABS 0.6 05/18/2022 1527   MONOABS 0.8 (H) 07/12/2011 0919   EOSABS 0.2 05/18/2022 1527   EOSABS 0.4 07/12/2011 0919   BASOSABS 0.1 05/18/2022 1527   BASOSABS 0.1 07/12/2011 0919    BMET    Component Value Date/Time   NA 134 (L) 05/29/2022 0237   NA 141 07/12/2011 0919   K 3.9 05/29/2022 0237   K 4.4 07/12/2011 0919   CL 106 05/29/2022 0237   CL 102 07/12/2011 0919   CO2 25 05/29/2022 0237   CO2 29 07/12/2011 0919   GLUCOSE 211 (H) 05/29/2022 0237   GLUCOSE 76 07/12/2011 0919   BUN 30 (H) 05/29/2022 0237   BUN 16  05/05/2013 1022   CREATININE 0.77 05/29/2022 0237   CREATININE 0.71 05/05/2013 1022   CALCIUM 8.3 (L) 05/29/2022 0237   CALCIUM 8.8 07/12/2011 0919   GFRNONAA >60 05/29/2022 0237   GFRNONAA >60 05/05/2013 1022   GFRAA >60 01/06/2020 1853   GFRAA >60 05/05/2013 1022    INR    Component Value Date/Time   INR 1.3 (H) 05/18/2022 1527     Intake/Output Summary (Last 24 hours) at 05/29/2022 0948 Last data filed at 05/29/2022 0800 Gross per 24 hour  Intake 820 ml  Output 3500 ml  Net -2680 ml     Assessment/Plan:  67 y.o. male is s/p Rt BKA  5 Days Post-Op   Patient notes that his pain is well-controlled with current medical regimen, continue with as needed use of pain medication Patient to continue to work with physical therapy, patient expresses interest in going to rehab at discharge.  I think this is a very good idea as he previously did not go to rehab and has definitely noticed some significant weakness and difficulty with transfers.  I believe that rehab will allow him to have a better quality of life once he  is able to return home. No drainage on postoperative dressing today, no significant bleeding noted and staples are dry and intact. Dressing changed today.  Reinforced the importance of keeping his knee straight to avoid contractures.       Per Vascular Surgery Plan okay for discharge to rehab today.    DVT prophylaxis:  ASA 81 mg, Plavix 75 mg, Lovenox 67.5 mg    Drema Pry [facility] 05/29/2022 9:48 AM

## 2022-05-29 NOTE — TOC Progression Note (Addendum)
Transition of Care (TOC) - Progression Note    Patient Details  Name: [name]. MRN: [id number] Date of Birth: 11-20-1955  Transition of Care [facility]) CM/SW [place], LCSW Phone Number: 05/29/2022, 8:55 AM  Clinical Narrative:  Received call from wife. She has been feeling sick since at least yesterday and has a doctors appt today at 12:45. She's unsure if she could care for him today if discharged. Wife spoke to patient's insurance and they told her they have not received an auth request and that the hospital could check his SNF days. CSW explained that SNF's are able to check his days and that they are not offering because he has exhausted his benefits.  11:18 am: [employer] wheelchairs are only designed for antitippers for the back of the wheelchair. Wife said they never received them when they received wheelchair in October. [employer] liaison is aware and will deliver them to the room. Discussed PT concerns with wife concerning private vehicle transport. She said CSW can ask patient if he is okay with EMS transport.  2:39 pm: Received call from wife. She tested positive for COVID and doctors office told her quarantine for 5 days starting tomorrow. Explained that patient may likely discharge before that quarantine period is up. She expressed frustration due to concern that patient will get COVID after just having surgery. Relayed concerns to team.  3:11 pm: Received call from patient's brother, Cleotis Sparr. He voiced concerns with discharge plan, stating that we had not checked with insurance or SNF's regarding if they could accept or not. Explained that SNF referral was sent out to many facilities and that this CSW checked with four of them yesterday who all confirmed patient has exhausted his benefits due to not having 60-day wellness period to start his SNF days over. Eric asked to speak with this Media planner. CSW left him a voicemail. Will provide brother's contact  information when he calls back.  4:03 pm: Received call back from [facility] He will review information and follow up.  Expected Discharge Plan: Home/Self Care Barriers to Discharge: Continued Medical Work up  Expected Discharge Plan and Services   Discharge Planning Services: CM Consult   Living arrangements for the past 2 months: [place]: Well [place] Determinants of Health (SDOH) Interventions [place]: No Food Insecurity (05/19/2022)  Housing: Low Risk  (05/19/2022)  Transportation Needs: No Transportation Needs (05/19/2022)  Recent Concern: Transportation Needs - Unmet Transportation Needs (03/21/2022)  Utilities: Not At Risk (05/19/2022)  Depression (PHQ2-9): Low Risk  (11/21/2021)  Tobacco Use: Medium Risk (05/25/2022)    Readmission Risk Interventions    02/13/2022    4:57 PM 09/29/2021   11:12 AM  Readmission Risk Prevention Plan  Transportation Screening Complete Complete  PCP or Specialist Appt within 3-5 Days  Complete  HRI or [place]  Complete  Social Work Consult for [place] Planning/Counseling  Complete  Palliative Care Screening  Not Applicable  Medication Review Press photographer) Complete Complete  PCP or Specialist appointment within 3-5 days of discharge Complete   HRI or [name] Complete   SW Recovery Care/Counseling Consult Complete   [place]  Not Applicable

## 2022-05-29 NOTE — Plan of Care (Signed)

## 2022-05-29 NOTE — Telephone Encounter (Signed)
Patient spouse was made aware with provider medical recommendations and did verbalized understanding.

## 2022-05-29 NOTE — Progress Notes (Addendum)
Physical Therapy Treatment Patient Details Name: [name]. MRN: [id number] DOB: 1956/03/23 Today's Date: 05/29/2022   History of Present Illness Pt is a 67 y/o M admitted on 05/18/22 with concern for right ankle osteomyelitis after an injury in December 2023. Pt was seen in consultation by podiatry, who recommended continuation of empiric IV antibiotics, underwent angiogram & angioplasty of the right anterior tibial artery with vascular surgery on 01/22. After consideration, opted for & underwent R BKA 01/25 given low likelihood of healing with non-surgical management. PMH: COPD, chronic respiratory failure on home O2, CAD s/p CABG, IDDM2, OSA on bipap at night, BPH HTN, hypothyroidism, paroxysmal a-fib on eliquis & amiodarone, PAD, L BKA    PT Comments    Pt in bed agrees to session.  Wheelchair in room.  It does not have anti-tippers on front or back. It is situated so back of chair is lower than front which he stated he chose to do so he could sit back further in chair for comfort.  Pt requires mod a x 1 for rolling left/right to place [place] pad with use of rails.  Pt does not have hospital bed at home and wife has declined stating they do not have room for one.  Without rails he will require heavy assist to roll to place pad.  Would recommend siderails attached to bed if possible at home to ease caregiver burden and increase independence and safety.    Hoyer lift to chair.  It is a snug fit but pt stated he does not want a wider chair because he needs to fit in doorframes and does not want to remove doors from his home.  He is able to sit in chair and given tilt it does seem to suit him.  Wheelchair mobility is deferred due to risk of tipping backwards without anti-tippers and risk of injury to patient and staff.  Once fit and comfort is confirmed he is transferred to recliner in room.  Chair needs to be held down while lifting pt as it does lift as he is lifted.  Concern for pressure  injuries are noted.  Pt is unsafe to transfer in and out of his car.  Wife has declined EMS transport which is best option.  Pt may be appropriate for medical transport IF he has anti-tippers front and back of chair prior to discharge.  He does state a concern for his chair to go up onto his ramp with anti-tippers.  Unable to test this prior to discharge.    After session pt stated wife is now sick with pneumonia or the flu and is unsure if she can care for him given her level of illness.  Discussed with team.  SNF remains best option for discharge.  EMS transport home if recommended as we cannot confirm if chair will be able to get up onto ramp with anto-tippers  for transport van.  Would encourage family to take chair home with anti-tippers on to test prior to medical transport.   Recommendations for follow up therapy are one component of a multi-disciplinary discharge planning process, led by the attending physician.  Recommendations may be updated based on patient status, additional functional criteria and insurance authorization.  Follow Up Recommendations  Skilled nursing-short term rehab (<3 hours/day) Can patient physically be transported by private vehicle: No   Assistance Recommended at Discharge Frequent or constant Supervision/Assistance  Patient can return home with the following A lot of help with walking and/or transfers;A lot of  help with bathing/dressing/bathroom;Assist for transportation;Help with stairs or ramp for entrance;Direct supervision/assist for financial management;Assistance with cooking/housework   Equipment Recommendations  Hospital bed    Recommendations for Other Services       Precautions / Restrictions Restrictions Weight Bearing Restrictions: Yes RLE Weight Bearing: Non weight bearing LLE Weight Bearing: Non weight bearing Other Position/Activity Restrictions: S/p R BKA, hx L BKA     Mobility  Bed Mobility Overal bed mobility: Needs Assistance Bed  Mobility: Rolling Rolling: Mod assist, Max assist         General bed mobility comments: uses rails to roll, continue to recommend hospital bed but wife has decliend due to no room.  he said he does have aheadboard to help roll but it will be difficulty for wife if he does not have siderails to assist with rolling for care and hoyer pad placement    Transfers     Transfers: Bed to chair/wheelchair/BSC                  Ambulation/Gait                   Stairs             Wheelchair Mobility    Modified Rankin (Stroke Patients Only)       Balance Overall balance assessment: Needs assistance Sitting-balance support: Bilateral upper extremity supported Sitting balance-Leahy Scale: Poor                                      Cognition Arousal/Alertness: Awake/alert Behavior During Therapy: WFL for tasks assessed/performed Overall Cognitive Status: Within Functional Limits for tasks assessed                                          Exercises      General Comments        Pertinent Vitals/Pain Pain Assessment Pain Assessment: Faces Faces Pain Scale: Hurts a little bit Pain Location: R limb Pain Descriptors / Indicators: Discomfort Pain Intervention(s): Limited activity within patient's tolerance, Monitored during session, Repositioned    Home Living                          Prior Function            PT Goals (current goals can now be found in the care plan section) Progress towards PT goals: Progressing toward goals    Frequency    7X/week      PT Plan Current plan remains appropriate    Co-evaluation              AM-PAC PT "6 Clicks" Mobility   Outcome Measure  Help needed turning from your back to your side while in a flat bed without using bedrails?: A Lot Help needed moving from lying on your back to sitting on the side of a flat bed without using bedrails?: A Lot Help  needed moving to and from a bed to a chair (including a wheelchair)?: Total Help needed standing up from a chair using your arms (e.g., wheelchair or bedside chair)?: Total Help needed to walk in hospital room?: Total Help needed climbing 3-5 steps with a railing? : Total 6 Click Score: 8    End of Session  Activity Tolerance: Patient tolerated treatment well Patient left: in chair;with nursing/sitter in room Nurse Communication: Mobility status PT Visit Diagnosis: Muscle weakness (generalized) (M62.81);Other abnormalities of gait and mobility (R26.89);Pain Pain - Right/Left: Right Pain - part of body: Leg     Time: 2956-2130 PT Time Calculation (min) (ACUTE ONLY): 18 min  Charges:  $Therapeutic Activity: 8-22 mins                   Chesley Noon, PTA 05/29/22, 10:05 AM

## 2022-05-29 NOTE — Telephone Encounter (Signed)
Patients wife called in wanting to know if the patient comes home after being discharged from the hospital will he be ok to come home with her because she now [place] and has to be quarantine for 5 days. Patient isn't able to go to rehab because of insurance issues. Patient wife doesn't want patient to relapse coming home or anything with her bring sick and not being able to care for him and her self.   Asking for nurse or provider to call her back with answers.    Dr Lucky Cowboy states patient is fine to go home with wife having covid but patient should be going to rehab. Let Dr know he couldn't because of insurance. Patients wife wants DR to prolong patients discharge until wife is better from [place]. Dr. Lucky Cowboy says he doesn't have control over that.     Please call and advise

## 2022-05-29 NOTE — Progress Notes (Signed)
PROGRESS NOTE    [name].   UEA:[id number] DOB: [birth year]  DOA: 05/18/2022 Date of Service: 05/29/22 PCP: [name], MD     Brief Narrative / Hospital Course:  This is a pleasant 67 year old gentleman with a history of COPD, chronic respiratory failure on home O2, CAD status post CABG, insulin-dependent type 2 diabetes, OSA on BiPAP at night, BPH hypertension, hypothyroidism and paroxysmal atrial fibrillation on Eliquis and amiodarone who has peripheral arterial disease status post left BKA and is admitted to the hospital with concern for right ankle osteomyelitis after an injury in December 2023.  He was seen in consultation by podiatry, who recommends continuation of empiric IV antibiotics, underwent angiogram and angioplasty of the right anterior tibial artery with vascular surgery on 01/22. After consideration, opted for R BKA 01/24 given low likelihood of healing with non-surgical management. Awaiting SNF placement. Stopped abx 01/26 Friday. Planning SNF rehab placement after the weekend, however days are used up and cannot afford out of pocket, confirming DME and plan for discharge home 01/30 - however wife is ill and wheelchair needs anti-tippers, possibly will need EMS transport home. Unsafe dispo at this time (01/30), will keep inpatient for now pending family ability to care for patient and pending DME.   Consultants:  Podiatry Vascular Surgery   Procedures: 05/21/22 RLE Angiogram with angioplasty  05/24/22 R below-knee amputation       ASSESSMENT & PLAN:   Principal Problem:   Non-healing wound of lower extremity, initial encounter Active Problems:   Acute osteomyelitis of right foot (HCC)   PAD (peripheral artery disease) (HCC)   S/P BKA (below knee amputation), left (HCC)   AKI (acute kidney injury) ([place])   Hyperkalemia   Chronic anticoagulation   AF (paroxysmal atrial fibrillation) ([place])   Uncontrolled type 2 diabetes mellitus with  hypoglycemia, with long-term current use of insulin (HCC)   OSA (obstructive sleep apnea)   Chronic respiratory failure with hypoxia (HCC)   COPD (chronic obstructive pulmonary disease) (HCC)   Chronic diastolic CHF (congestive heart failure) (HCC)   CAD with Hx of CABG   Essential hypertension   BPH (benign prostatic hyperplasia)   Acute osteomyelitis of right foot and ankle (HCC) Nonhealing wound right ankle PAD s/p left BKA S/p angiogram with angioplasty 1/22 with vascular surgery S/p R BKA 05/24/22  D/c cefepime and vancomycin 05/25/22  Plan home w/ HH - SNF days used up and pt/family cannot afford rehab care Vascular team ok for Eliquis, d/c clopidogrel    Anemia Chronic, worse now d/t likely myelosuppression/chronic illness + recent surgery 1 unit PRBC this admission, repeat HH improved  Monitor CBC Caution given fall risk and AC/ASA  Uncontrolled type 2 diabetes mellitus with hypoglycemia, with long-term current use of insulin ([place]) Blood sugar in the 300s on admission initially Continue home basal insulin - basal increased Sliding scale coverage increased    COPD (chronic obstructive pulmonary disease) ([place]) OSA on home BiPAP qhs Chronic respiratory failure on home O2 Patient is at baseline Continue home Pulmicort and inhalers Continue BiPAP and O2 at night and anytime he is sleeping   AF (paroxysmal atrial fibrillation) (HCC) Chronic anticoagulation CAD with Hx of CABG Chronic Essential hypertension  Continue aspirin + Eliquis based on cardiology recs Continue ezetimibe, Imdur, amiodarone Restarting losartan   Chronic diastolic CHF (congestive heart failure) ([place]) Clinically euvolemic but did require 1500 mL IV fluids to maintain BP postoperatively holding Lasix unless as needed   BPH (benign prostatic  hyperplasia) Continue finasteride and tamsulosin  Hyperkalemia - resolved Monitor BMP   AKI (acute kidney injury) ([place]) - resolved Monitor BMP    Pseudohyponatremia d/t hyperglycemia Monitor BMP    DVT prophylaxis: restarting eliquis today   Pertinent IV fluids/nutrition: no continuous IV fluids  Central lines / invasive devices: none   Code Status: FULL CODE   Current Admission Status: inpatient   TOC needs / Dispo plan: will need SNF rehab Barriers to discharge / significant pending items: s/p BKA POD5, plan was for discharge home 01/30 today - however wife is ill and wheelchair needs anti-tippers, possibly will need EMS transport home. Unsafe dispo at this time (01/30), will keep inpatient for now pending family ability to care for patient and pending DME.              Subjective / Brief ROS:  Pt feeling okay today, pain controlled, better than yesterday  No CP/SOB  Family Communication: none at this time     Objective Findings:  Vitals:   05/28/22 0733 05/28/22 1720 05/29/22 0730 05/29/22 0801  BP:  (!) 142/71 (!) 150/72   Pulse:  94 92   Resp:  16 18   Temp:  98.3 F (36.8 C) 98.9 F (37.2 C)   TempSrc:   Oral   SpO2: 98% 98% 96% 96%  Weight:      Height:        Intake/Output Summary (Last 24 hours) at 05/29/2022 1220 Last data filed at 05/29/2022 0800 Gross per 24 hour  Intake 580 ml  Output 3500 ml  Net -2920 ml    Filed Weights   05/18/22 2150  Weight: 135.5 kg    Examination:  Physical Exam Constitutional:      Appearance: Normal appearance. He is obese. He is not ill-appearing.  Cardiovascular:     Rate and Rhythm: Normal rate and regular rhythm.  Pulmonary:     Effort: Pulmonary effort is normal.     Breath sounds: Normal breath sounds.  Abdominal:     Palpations: Abdomen is soft.  Musculoskeletal:     Comments: LLE BKA, RLE BKA  Skin:    General: Skin is dry.  Neurological:     General: No focal deficit present.     Mental Status: He is alert and oriented to person, place, and time.  Psychiatric:        Behavior: Behavior normal.        Thought Content: Thought  content normal.          Scheduled Medications:   amiodarone  200 mg Oral Daily   ascorbic acid  500 mg Oral BID   aspirin EC  81 mg Oral Daily   budesonide  0.5 mg Nebulization BID   clopidogrel  75 mg Oral Daily   cyanocobalamin  5,000 mcg Oral Daily   enoxaparin (LOVENOX) injection  0.5 mg/kg Subcutaneous Q24H   ezetimibe  10 mg Oral Daily   ferrous sulfate  325 mg Oral Q breakfast   finasteride  5 mg Oral Daily   gabapentin  300 mg Oral QHS   insulin aspart  0-20 Units Subcutaneous TID WC   insulin aspart  0-5 Units Subcutaneous QHS   insulin aspart  10 Units Subcutaneous TID WC   insulin glargine-yfgn  15 Units Subcutaneous Daily   insulin glargine-yfgn  25 Units Subcutaneous QHS   isosorbide mononitrate  60 mg Oral Daily   levothyroxine  25 mcg Oral Q0600   multivitamin with minerals  1 tablet Oral Daily   nutrition supplement (JUVEN)  1 packet Oral BID BM   pramipexole  2 mg Oral QHS   primidone  250 mg Oral BID   Ensure Max Protein  11 oz Oral QHS   risperiDONE  0.5 mg Oral BID   tamsulosin  0.4 mg Oral Daily   zinc sulfate  220 mg Oral Daily    Continuous Infusions:  sodium chloride 75 mL/hr at 05/28/22 0806     PRN Medications:  acetaminophen **OR** acetaminophen, albuterol, fentaNYL (SUBLIMAZE) injection, fentaNYL (SUBLIMAZE) injection, HYDROcodone-acetaminophen, HYDROmorphone (DILAUDID) injection, HYDROmorphone (DILAUDID) injection, morphine injection, ondansetron **OR** ondansetron (ZOFRAN) IV, ondansetron (ZOFRAN) IV, traZODone  Antimicrobials from admission:  Anti-infectives (From admission, onward)    Start     Dose/Rate Route Frequency Ordered Stop   05/24/22 1000  vancomycin (VANCOREADY) IVPB 1500 mg/300 mL        1,500 mg 150 mL/hr over 120 Minutes Intravenous 120 min pre-op 05/23/22 1830 05/25/22 1459   05/21/22 1655  ciprofloxacin (CIPRO) IVPB  Status:  Discontinued        over 60 Minutes  Continuous PRN 05/21/22 1655 05/21/22 1737    05/21/22 1515  ciprofloxacin (CIPRO) IVPB 400 mg  Status:  Discontinued        400 mg 200 mL/hr over 60 Minutes Intravenous 60 min pre-op 05/21/22 1515 05/21/22 1736   05/19/22 1200  vancomycin (VANCOREADY) IVPB 1250 mg/250 mL  Status:  Discontinued        1,250 mg 166.7 mL/hr over 90 Minutes Intravenous Every 12 hours 05/19/22 0739 05/25/22 1511   05/19/22 0100  vancomycin (VANCOREADY) IVPB 500 mg/100 mL       See Hyperspace for full Linked Orders Report.   500 mg 100 mL/hr over 60 Minutes Intravenous  Once 05/18/22 2209 05/19/22 0241   05/18/22 2300  vancomycin (VANCOREADY) IVPB 2000 mg/400 mL       See Hyperspace for full Linked Orders Report.   2,000 mg 200 mL/hr over 120 Minutes Intravenous  Once 05/18/22 2209 05/19/22 0138   05/18/22 2300  ceFEPIme (MAXIPIME) 2 g in sodium chloride 0.9 % 100 mL IVPB  Status:  Discontinued        2 g 200 mL/hr over 30 Minutes Intravenous Every 8 hours 05/18/22 2209 05/25/22 1511   05/18/22 2210  vancomycin variable dose per unstable renal function (pharmacist dosing)  Status:  Discontinued         Does not apply See admin instructions 05/18/22 2211 05/20/22 0908   05/18/22 2145  metroNIDAZOLE (FLAGYL) tablet 500 mg        500 mg Oral Every 12 hours 05/18/22 2116 05/25/22 2144   05/18/22 2000  metroNIDAZOLE (FLAGYL) IVPB 500 mg  Status:  Discontinued        500 mg 100 mL/hr over 60 Minutes Intravenous  Once 05/18/22 1948 05/18/22 2144   05/18/22 2000  ceFEPIme (MAXIPIME) 2 g in sodium chloride 0.9 % 100 mL IVPB  Status:  Discontinued        2 g 200 mL/hr over 30 Minutes Intravenous  Once 05/18/22 1959 05/18/22 2209   05/18/22 2000  vancomycin (VANCOCIN) IVPB 1000 mg/200 mL premix  Status:  Discontinued        1,000 mg 200 mL/hr over 60 Minutes Intravenous  Once 05/18/22 1959 05/18/22 2209           Data Reviewed:  I have personally reviewed the following...  CBC: Recent Labs  Lab 05/25/22 0446 05/26/22  5102 05/27/22 0506  05/27/22 1743 05/28/22 0822 05/29/22 0237  WBC 12.2* 14.3* 11.4*  --  11.7* 11.2*  HGB 8.2* 7.3* 6.9* 8.1* 7.9* 7.6*  HCT 26.7* 22.7* 21.7* 25.9* 25.5* 24.8*  MCV 91.1 89.0 88.9  --  90.1 90.2  PLT 302 306 302  --  342 585    Basic Metabolic Panel: Recent Labs  Lab 05/26/22 0525 05/26/22 1146 05/27/22 0506 05/28/22 0822 05/29/22 0237  NA 129* 128* 131* 137 134*  K 4.3 4.2 4.2 4.1 3.9  CL 102 100 102 106 106  CO2 21* 22 20* 24 25  GLUCOSE 102* 239* 272* 195* 211*  BUN 43* 38* 41* 31* 30*  CREATININE 0.88 0.99 0.93 0.84 0.77  CALCIUM 8.4* 8.3* 8.5* 8.7* 8.3*    GFR: Estimated Creatinine Clearance: 134.7 mL/min (by C-G formula based on SCr of 0.77 mg/dL). Liver Function Tests: No results for input(s): "AST", "ALT", "ALKPHOS", "BILITOT", "PROT", "ALBUMIN" in the last 168 hours.  No results for input(s): "LIPASE", "AMYLASE" in the last 168 hours. No results for input(s): "AMMONIA" in the last 168 hours. Coagulation Profile: No results for input(s): "INR", "PROTIME" in the last 168 hours.  Cardiac Enzymes: No results for input(s): "CKTOTAL", "CKMB", "CKMBINDEX", "TROPONINI" in the last 168 hours. BNP (last 3 results) No results for input(s): "PROBNP" in the last 8760 hours. HbA1C: No results for input(s): "HGBA1C" in the last 72 hours.  CBG: Recent Labs  Lab 05/28/22 1214 05/28/22 1725 05/28/22 2234 05/29/22 0719 05/29/22 1108  GLUCAP 194* 226* 202* 214* 267*    Lipid Profile: No results for input(s): "CHOL", "HDL", "LDLCALC", "TRIG", "CHOLHDL", "LDLDIRECT" in the last 72 hours. Thyroid Function Tests: No results for input(s): "TSH", "T4TOTAL", "FREET4", "T3FREE", "THYROIDAB" in the last 72 hours.  Anemia Panel: No results for input(s): "VITAMINB12", "FOLATE", "FERRITIN", "TIBC", "IRON", "RETICCTPCT" in the last 72 hours. Most Recent Urinalysis On File:     Component Value Date/Time   COLORURINE YELLOW (A) 05/20/2022 0134   APPEARANCEUR CLEAR (A)  05/20/2022 0134   LABSPEC 1.013 05/20/2022 0134   PHURINE 6.0 05/20/2022 0134   GLUCOSEU 50 (A) 05/20/2022 0134   HGBUR NEGATIVE 05/20/2022 0134   BILIRUBINUR NEGATIVE 05/20/2022 0134   KETONESUR NEGATIVE 05/20/2022 0134   PROTEINUR 100 (A) 05/20/2022 0134   NITRITE NEGATIVE 05/20/2022 0134   LEUKOCYTESUR NEGATIVE 05/20/2022 0134   Sepsis Labs: '@LABRCNTIP'$ (procalcitonin:4,lacticidven:4) Microbiology: No results found for this or any previous visit (from the past 240 hour(s)).     Radiology Studies last 3 days: No results found.           LOS: 11 days       Emeterio Reeve, DO [facility] 05/29/2022, 12:20 PM    Dictation software may have been used to generate the above note. Typos may occur and escape review in typed/dictated notes. Please contact Dr Sheppard Coil directly for clarity if needed.  Staff may message me via secure chat in [place]  but this may not receive an immediate response,  please page me for urgent matters!  If 7PM-7AM, please contact night coverage [email or web address]

## 2022-05-29 NOTE — Inpatient Diabetes Management (Signed)
Inpatient Diabetes Program Recommendations  AACE/ADA: New Consensus Statement on Inpatient Glycemic Control   Target Ranges:  Prepandial:   less than 140 mg/dL      Peak postprandial:   less than 180 mg/dL (1-2 hours)      Critically ill patients:  140 - 180 mg/dL    Latest Reference Range & Units 05/28/22 08:04 05/28/22 12:14 05/28/22 17:25 05/28/22 22:34 05/29/22 07:19  Glucose-Capillary 70 - 99 mg/dL 201 (H) 194 (H) 226 (H) 202 (H) 214 (H)    Review of Glycemic Control  Diabetes history: DM2 Outpatient Diabetes medications: Semglee 40 units QAM, Novolog 15 units TID with meals Current orders for Inpatient glycemic control: Semglee 15 units QAM, Semglee 25 units QHS, Novolog 10 units TID with meals, Novolog 0-20 units TID with meals, Novolog 0-5 units QHS   Inpatient Diabetes Program Recommendations:     Insulin:  Please consider increasing Semglee to 20 units QAM and increase meal coverage to Novolog 12 units TID with meals.   Thanks, Barnie Alderman, RN, MSN, [place] Diabetes Coordinator Inpatient Diabetes Program [phone] (Team Pager from 8am to [place])

## 2022-05-30 DIAGNOSIS — N179 Acute kidney failure, unspecified: Secondary | ICD-10-CM | POA: Diagnosis not present

## 2022-05-30 DIAGNOSIS — M869 Osteomyelitis, unspecified: Secondary | ICD-10-CM | POA: Diagnosis not present

## 2022-05-30 DIAGNOSIS — Z899 Acquired absence of limb, unspecified: Secondary | ICD-10-CM

## 2022-05-30 DIAGNOSIS — S81809A Unspecified open wound, unspecified lower leg, initial encounter: Secondary | ICD-10-CM | POA: Diagnosis not present

## 2022-05-30 DIAGNOSIS — E1165 Type 2 diabetes mellitus with hyperglycemia: Secondary | ICD-10-CM

## 2022-05-30 DIAGNOSIS — E875 Hyperkalemia: Secondary | ICD-10-CM

## 2022-05-30 LAB — CBC
HCT: 27.4 % — ABNORMAL LOW (ref 39.0–52.0)
Hemoglobin: 8.6 g/dL — ABNORMAL LOW (ref 13.0–17.0)
MCH: 28.2 pg (ref 26.0–34.0)
MCHC: 31.4 g/dL (ref 30.0–36.0)
MCV: 89.8 fL (ref 80.0–100.0)
Platelets: 392 10*3/uL (ref 150–400)
RBC: 3.05 MIL/uL — ABNORMAL LOW (ref 4.22–5.81)
RDW: 15.5 % (ref 11.5–15.5)
WBC: 12.1 10*3/uL — ABNORMAL HIGH (ref 4.0–10.5)
nRBC: 0 % (ref 0.0–0.2)

## 2022-05-30 LAB — BASIC METABOLIC PANEL
Anion gap: 4 — ABNORMAL LOW (ref 5–15)
BUN: 25 mg/dL — ABNORMAL HIGH (ref 8–23)
CO2: 25 mmol/L (ref 22–32)
Calcium: 8.6 mg/dL — ABNORMAL LOW (ref 8.9–10.3)
Chloride: 107 mmol/L (ref 98–111)
Creatinine, Ser: 0.66 mg/dL (ref 0.61–1.24)
GFR, Estimated: >60 mL/min (ref >60)
Glucose, Bld: 125 mg/dL — ABNORMAL HIGH (ref 70–99)
Potassium: 3.9 mmol/L (ref 3.5–5.1)
Sodium: 136 mmol/L (ref 135–145)

## 2022-05-30 LAB — GLUCOSE, CAPILLARY
Glucose-Capillary: 163 mg/dL — ABNORMAL HIGH (ref 70–99)
Glucose-Capillary: 224 mg/dL — ABNORMAL HIGH (ref 70–99)
Glucose-Capillary: 228 mg/dL — ABNORMAL HIGH (ref 70–99)
Glucose-Capillary: 289 mg/dL — ABNORMAL HIGH (ref 70–99)

## 2022-05-30 MED ORDER — INSULIN GLARGINE-YFGN 100 UNIT/ML ~~LOC~~ SOLN
25.0000 [IU] | Freq: Every day | SUBCUTANEOUS | Status: DC
  Administered 2022-05-31 – 2022-06-01 (×2): 25 [IU] via SUBCUTANEOUS
  Filled 2022-05-30 (×2): qty 0.25

## 2022-05-30 MED ORDER — HYDROCODONE-ACETAMINOPHEN 5-325 MG PO TABS
1.0000 | ORAL_TABLET | ORAL | Status: DC | PRN
  Administered 2022-06-01 – 2022-06-05 (×5): 1 via ORAL
  Filled 2022-05-30 (×6): qty 1

## 2022-05-30 NOTE — Plan of Care (Signed)

## 2022-05-30 NOTE — Inpatient Diabetes Management (Signed)
Inpatient Diabetes Program Recommendations  AACE/ADA: New Consensus Statement on Inpatient Glycemic Control  Target Ranges:  Prepandial:   less than 140 mg/dL      Peak postprandial:   less than 180 mg/dL (1-2 hours)      Critically ill patients:  140 - 180 mg/dL    Latest Reference Range & Units 05/29/22 07:19 05/29/22 11:08 05/29/22 16:10 05/29/22 16:28 05/29/22 21:15 05/30/22 08:53  Glucose-Capillary 70 - 99 mg/dL 214 (H) 267 (H) 304 (H) 300 (H) 282 (H) 163 (H)   Review of Glycemic Control  Diabetes history: DM2 Outpatient Diabetes medications: Semglee 40 units QAM, Novolog 15 units TID with meals Current orders for Inpatient glycemic control: Semglee 15 units QAM, Semglee 25 units QHS, Novolog 10 units TID with meals, Novolog 0-20 units TID with meals, Novolog 0-5 units QHS   Inpatient Diabetes Program Recommendations:     Insulin: CBGs 163-304 mg/dl over past 24 hours.  Please consider increasing Semglee to 20 units QAM and increase meal coverage to Novolog 12 units TID with meals.   Thanks, Barnie Alderman, RN, MSN, [place] Diabetes Coordinator Inpatient Diabetes Program [phone] (Team Pager from 8am to [place])

## 2022-05-30 NOTE — Progress Notes (Signed)
Physical Therapy Treatment Patient Details Name: [name]. MRN: [id number] DOB: 11-18-1955 Today's Date: 05/30/2022   History of Present Illness Pt is a 67 y/o M admitted on 05/18/22 with concern for right ankle osteomyelitis after an injury in December 2023. Pt was seen in consultation by podiatry, who recommended continuation of empiric IV antibiotics, underwent angiogram & angioplasty of the right anterior tibial artery with vascular surgery on 01/22. After consideration, opted for & underwent R BKA 01/25 given low likelihood of healing with non-surgical management. PMH: COPD, chronic respiratory failure on home O2, CAD s/p CABG, IDDM2, OSA on bipap at night, BPH HTN, hypothyroidism, paroxysmal a-fib on eliquis & amiodarone, PAD, L BKA    PT Comments    Pt was long sitting I bed upon arrival. He is A and O x 4 and agreeable to session. Continues to endorse R LE pain however pain did not limit session progression. He was able to achieve EOB short sit with mod/max of one. Sat EOB x ~ 12 minutes while performing various LE exercises and stretching. He tolerated well but fatigued quickly. Returned to supine in bed prior to [employer] lift transfer to recliner. Pt had BM prior to hoyer transfer. RN tech assisted with hygiene care. Highly recommend DC to SNF however pt is out of rehab days and reports unable to private pay. If he decides to DC home, recommend HHPT + EMS transport. Acute PT will continue to follow and progress as able per current POC.   Recommendations for follow up therapy are one component of a multi-disciplinary discharge planning process, led by the attending physician.  Recommendations may be updated based on patient status, additional functional criteria and insurance authorization.  Follow Up Recommendations  Skilled nursing-short term rehab (<3 hours/day) (If pt remains unable to DC to SNF, recommend HHPT. Recommend EMS transport if going home from acute hospital.) Can  patient physically be transported by private vehicle: No   Assistance Recommended at Discharge Frequent or constant Supervision/Assistance  Patient can return home with the following A lot of help with walking and/or transfers;A lot of help with bathing/dressing/bathroom;Assist for transportation;Help with stairs or ramp for entrance;Direct supervision/assist for financial management;Assistance with cooking/housework   Equipment Recommendations  Hospital bed       Precautions / Restrictions Precautions Precautions: Fall Restrictions Weight Bearing Restrictions: Yes RLE Weight Bearing: Non weight bearing     Mobility  Bed Mobility Overal bed mobility: Needs Assistance Bed Mobility: Rolling Rolling: Max assist Supine to sit: Mod assist Sit to supine: Min assist, Mod assist General bed mobility comments: Increased time to perform all mobility.    Transfers    General transfer comment: Engineer, drilling with transfer from bed to recliner after sitting EOB and performing ther ex Transfer via Lift Equipment:  (hoyer transfer from bed to recliner)  Ambulation/Gait  General Gait Details: unable    Balance Overall balance assessment: Needs assistance Sitting-balance support: Bilateral upper extremity supported Sitting balance-Leahy Scale: Poor Sitting balance - Comments: session focused on imporving core strength and sitting balance at EOB. requires Ue support to prevent LOB even in sitting    Cognition Arousal/Alertness: Awake/alert Behavior During Therapy: WFL for tasks assessed/performed Overall Cognitive Status: Within Functional Limits for tasks assessed    General Comments: Pt is A an O x4           General Comments General comments (skin integrity, edema, etc.): Pt performed BLE LAQ, seated marching and resisted hip seated Add/abd. In  bed, reviewed ther ex to promote strengthening and independence      Pertinent Vitals/Pain Pain Assessment Pain Assessment:  0-10 Pain Score: 6  Pain Location: R limb Pain Descriptors / Indicators: Discomfort Pain Intervention(s): Limited activity within patient's tolerance, Monitored during session, Premedicated before session, Repositioned     PT Goals (current goals can now be found in the care plan section) Acute Rehab PT Goals Patient Stated Goal: decreased pain, get better Progress towards PT goals: Progressing toward goals    Frequency    7X/week      PT Plan Current plan remains appropriate       AM-PAC PT "6 Clicks" Mobility   Outcome Measure  Help needed turning from your back to your side while in a flat bed without using bedrails?: A Lot Help needed moving from lying on your back to sitting on the side of a flat bed without using bedrails?: A Lot Help needed moving to and from a bed to a chair (including a wheelchair)?: Total Help needed standing up from a chair using your arms (e.g., wheelchair or bedside chair)?: Total Help needed to walk in hospital room?: Total Help needed climbing 3-5 steps with a railing? : Total 6 Click Score: 8    End of Session   Activity Tolerance: Patient tolerated treatment well;Patient limited by fatigue;Patient limited by pain Patient left: in chair;with nursing/sitter in room Nurse Communication: Mobility status PT Visit Diagnosis: Muscle weakness (generalized) (M62.81);Other abnormalities of gait and mobility (R26.89);Pain Pain - Right/Left: Right Pain - part of body: Leg     Time: 1308-6578 PT Time Calculation (min) (ACUTE ONLY): 28 min  Charges:  $Therapeutic Exercise: 8-22 mins $Therapeutic Activity: 8-22 mins                    Julaine Fusi PTA 05/30/22, 2:00 PM

## 2022-05-30 NOTE — Progress Notes (Signed)
PROGRESS NOTE    [name].   UVO:[id number] DOB: [birth year]  DOA: 05/18/2022 Date of Service: 05/30/22 PCP: [name], MD  Brief Narrative / Hospital Course:  This is a pleasant 67 year old gentleman with a history of COPD, chronic respiratory failure on home O2, CAD status post CABG, insulin-dependent type 2 diabetes, OSA on BiPAP at night, BPH hypertension, hypothyroidism and paroxysmal atrial fibrillation on Eliquis and amiodarone who has peripheral arterial disease status post left BKA and is admitted to the hospital with concern for right ankle osteomyelitis after an injury in December 2023.  He was seen in consultation by podiatry, who recommends continuation of empiric IV antibiotics, underwent angiogram and angioplasty of the right anterior tibial artery with vascular surgery on 01/22. After consideration, opted for R BKA 01/24 given low likelihood of healing with non-surgical management. Awaiting SNF placement. Stopped abx 01/26 Friday. Planning SNF rehab placement after the  weekend, however days are used up and cannot afford out of pocket, confirming DME and HH  - medically stable for dc 01/30 - however wife is ill with COVID and is in isolation until Friday. She provides him with a good portion of care and will be unable to do so while isolating. Patient is SNF level care appropriate and has new amputation so would not be safe to dc home without continuous supervision and care. His dc will have to be delayed until safe home plan can be established. He has not been able to independently transfer out of bed post-op yet. He will continue to work with PT and we can continue to monitor for when patient will be safe to dc home.   Consultants:  Podiatry Vascular Surgery   Procedures: 05/21/22 RLE Angiogram with angioplasty  05/24/22 R below-knee amputation   ASSESSMENT & PLAN:   Principal Problem:   Non-healing wound of lower extremity, initial encounter Active  Problems:   Acute osteomyelitis of right foot (HCC)   PAD (peripheral artery disease) (HCC)   S/P BKA (below knee amputation), left (HCC)   AKI (acute kidney injury) ([place])   Hyperkalemia   Chronic anticoagulation   AF (paroxysmal atrial fibrillation) ([place])   Uncontrolled type 2 diabetes mellitus with hypoglycemia, with long-term current use of insulin (HCC)   OSA (obstructive sleep apnea)   Chronic respiratory failure with hypoxia (HCC)   COPD (chronic obstructive pulmonary disease) (HCC)   Chronic diastolic CHF (congestive heart failure) (HCC)   CAD with Hx of CABG   Essential hypertension   BPH (benign prostatic hyperplasia)   Acute osteomyelitis of right foot and ankle (HCC) Nonhealing wound right ankle PAD s/p left BKA 1/25 S/p angiogram with angioplasty 1/22 with vascular surgery Wound appears to be healing well completed cefepime and vancomycin 05/25/22  Plan home w/ HH - SNF days used up and pt/family cannot afford rehab care Vascular team ok for Eliquis, d/c clopidogrel   Continue PT/OT Analgesia PRN  Anemia Chronic, worse now d/t likely myelosuppression/chronic illness + recent surgery 1 unit PRBC this admission, repeat HH improved  Monitor CBC Caution given fall risk and AC/ASA  Uncontrolled type 2 diabetes mellitus with hypoglycemia, with long-term current use of insulin ([place]) Blood sugar in the 300s on admission initially Continue home basal insulin - basal increased Sliding scale coverage increased    COPD OSA on home BiPAP qhs Chronic respiratory failure on home O2 Patient is at baseline Continue home Pulmicort and inhalers Continue BiPAP and O2 at night and anytime he is  sleeping   PAF on Chronic anticoagulation CAD with Hx of CABG Chronic Essential hypertension  HFpEF- EF 55-60% Continue aspirin + Eliquis based on cardiology recs Continue ezetimibe, Imdur, amiodarone Continue losartan   BPH (benign prostatic hyperplasia) Continue finasteride and  tamsulosin  Hyperkalemia - resolved Monitor BMP   AKI - resolved Monitor BMP   Pseudohyponatremia d/t hyperglycemia Monitor BMP  DVT prophylaxis: eliquis Pertinent IV fluids/nutrition: no continuous IV fluids  Central lines / invasive devices: none   Code Status: FULL CODE   Current Admission Status: inpatient   TOC needs / Dispo plan: will need SNF rehab Barriers to discharge / significant pending items: s/p BKA 1/25, plan was for discharge to SNF but payer source is a limiting factor. Will dc home with HH once safe dispo plan and caregiver have been established.  Subjective / Brief ROS:  Pt reports physically feeling well today. Describes his wound pain at 2/10. His concerns are regarding his dc plan. He states that his wife provides a lot of his care and is unable to do so at this time since she is isolating for COVID precautions until Friday. He has not been able to independently transfer out of bed post-op yet. He will continue to work with PT and we can continue to monitor for when patient will be safe to dc home.   Family Communication: none at this time    Objective Findings:  Vitals:   05/29/22 2053 05/29/22 2322 05/30/22 0748 05/30/22 0851  BP:  131/64  138/74  Pulse:  88  92  Resp:  18  18  Temp:  98.6 F (37 C)  98.1 F (36.7 C)  TempSrc:      SpO2: 96% 94% 94% 96%  Weight:      Height:        Intake/Output Summary (Last 24 hours) at 05/30/2022 1103 Last data filed at 05/30/2022 1031 Gross per 24 hour  Intake 660 ml  Output 1550 ml  Net -890 ml   Filed Weights   05/18/22 2150  Weight: 135.5 kg    Examination:  Physical Exam Constitutional:      Appearance: Normal appearance. He is obese. He is not ill-appearing.  Cardiovascular:     Rate and Rhythm: Normal rate and regular rhythm.  Pulmonary:     Effort: Pulmonary effort is normal.     Breath sounds: Normal breath sounds.  Abdominal:     Palpations: Abdomen is soft.  Musculoskeletal:      Right lower leg: Tenderness present.     Left lower leg: Normal. No tenderness.     Comments: LLE BKA, RLE BKA     Right Lower Extremity: Right leg is amputated below knee. (surgical incision healing well. stables in place. no signs of infection. no dignificant amount of bleeding)    Left Lower Extremity: Left leg is amputated below knee.  Skin:    General: Skin is dry.  Neurological:     General: No focal deficit present.     Mental Status: He is alert and oriented to person, place, and time.  Psychiatric:        Behavior: Behavior normal.        Thought Content: Thought content normal.     Scheduled Medications:   amiodarone  200 mg Oral Daily   apixaban  5 mg Oral BID   ascorbic acid  500 mg Oral BID   aspirin EC  81 mg Oral Daily   budesonide  0.5 mg  Nebulization BID   cyanocobalamin  5,000 mcg Oral Daily   ezetimibe  10 mg Oral Daily   ferrous sulfate  325 mg Oral Q breakfast   finasteride  5 mg Oral Daily   gabapentin  300 mg Oral QHS   insulin aspart  0-20 Units Subcutaneous TID WC   insulin aspart  10 Units Subcutaneous TID WC   [START ON 05/31/2022] insulin glargine-yfgn  25 Units Subcutaneous Daily   isosorbide mononitrate  60 mg Oral Daily   levothyroxine  25 mcg Oral Q0600   losartan  25 mg Oral Daily   multivitamin with minerals  1 tablet Oral Daily   nutrition supplement (JUVEN)  1 packet Oral BID BM   pramipexole  2 mg Oral QHS   primidone  250 mg Oral BID   Ensure Max Protein  11 oz Oral QHS   risperiDONE  0.5 mg Oral BID   tamsulosin  0.4 mg Oral Daily   PRN Medications:  acetaminophen **OR** acetaminophen, albuterol, HYDROcodone-acetaminophen, morphine injection, ondansetron **OR** ondansetron (ZOFRAN) IV, traZODone  Antimicrobials from admission:  Anti-infectives (From admission, onward)    Start     Dose/Rate Route Frequency Ordered Stop   05/24/22 1000  vancomycin (VANCOREADY) IVPB 1500 mg/300 mL        1,500 mg 150 mL/hr over 120 Minutes  Intravenous 120 min pre-op 05/23/22 1830 05/25/22 1459   05/21/22 1655  ciprofloxacin (CIPRO) IVPB  Status:  Discontinued        over 60 Minutes  Continuous PRN 05/21/22 1655 05/21/22 1737   05/21/22 1515  ciprofloxacin (CIPRO) IVPB 400 mg  Status:  Discontinued        400 mg 200 mL/hr over 60 Minutes Intravenous 60 min pre-op 05/21/22 1515 05/21/22 1736   05/19/22 1200  vancomycin (VANCOREADY) IVPB 1250 mg/250 mL  Status:  Discontinued        1,250 mg 166.7 mL/hr over 90 Minutes Intravenous Every 12 hours 05/19/22 0739 05/25/22 1511   05/19/22 0100  vancomycin (VANCOREADY) IVPB 500 mg/100 mL       See Hyperspace for full Linked Orders Report.   500 mg 100 mL/hr over 60 Minutes Intravenous  Once 05/18/22 2209 05/19/22 0241   05/18/22 2300  vancomycin (VANCOREADY) IVPB 2000 mg/400 mL       See Hyperspace for full Linked Orders Report.   2,000 mg 200 mL/hr over 120 Minutes Intravenous  Once 05/18/22 2209 05/19/22 0138   05/18/22 2300  ceFEPIme (MAXIPIME) 2 g in sodium chloride 0.9 % 100 mL IVPB  Status:  Discontinued        2 g 200 mL/hr over 30 Minutes Intravenous Every 8 hours 05/18/22 2209 05/25/22 1511   05/18/22 2210  vancomycin variable dose per unstable renal function (pharmacist dosing)  Status:  Discontinued         Does not apply See admin instructions 05/18/22 2211 05/20/22 0908   05/18/22 2145  metroNIDAZOLE (FLAGYL) tablet 500 mg        500 mg Oral Every 12 hours 05/18/22 2116 05/25/22 2144   05/18/22 2000  metroNIDAZOLE (FLAGYL) IVPB 500 mg  Status:  Discontinued        500 mg 100 mL/hr over 60 Minutes Intravenous  Once 05/18/22 1948 05/18/22 2144   05/18/22 2000  ceFEPIme (MAXIPIME) 2 g in sodium chloride 0.9 % 100 mL IVPB  Status:  Discontinued        2 g 200 mL/hr over 30 Minutes Intravenous  Once 05/18/22  1959 05/18/22 2209   05/18/22 2000  vancomycin (VANCOCIN) IVPB 1000 mg/200 mL premix  Status:  Discontinued        1,000 mg 200 mL/hr over 60 Minutes Intravenous   Once 05/18/22 1959 05/18/22 2209       Data Reviewed:  I have personally reviewed the following.  CBC: Recent Labs  Lab 05/26/22 0525 05/27/22 0506 05/27/22 1743 05/28/22 0822 05/29/22 0237 05/30/22 0621  WBC 14.3* 11.4*  --  11.7* 11.2* 12.1*  HGB 7.3* 6.9* 8.1* 7.9* 7.6* 8.6*  HCT 22.7* 21.7* 25.9* 25.5* 24.8* 27.4*  MCV 89.0 88.9  --  90.1 90.2 89.8  PLT 306 302  --  342 367 664   Basic Metabolic Panel: Recent Labs  Lab 05/26/22 1146 05/27/22 0506 05/28/22 0822 05/29/22 0237 05/30/22 0621  NA 128* 131* 137 134* 136  K 4.2 4.2 4.1 3.9 3.9  CL 100 102 106 106 107  CO2 22 20* '24 25 25  '$ GLUCOSE 239* 272* 195* 211* 125*  BUN 38* 41* 31* 30* 25*  CREATININE 0.99 0.93 0.84 0.77 0.66  CALCIUM 8.3* 8.5* 8.7* 8.3* 8.6*   CBG: Recent Labs  Lab 05/29/22 1108 05/29/22 1610 05/29/22 1628 05/29/22 2115 05/30/22 0853  GLUCAP 267* 304* 300* 282* 163*   Most Recent Urinalysis On File:     Component Value Date/Time   COLORURINE YELLOW (A) 05/20/2022 0134   APPEARANCEUR CLEAR (A) 05/20/2022 0134   LABSPEC 1.013 05/20/2022 0134   PHURINE 6.0 05/20/2022 0134   GLUCOSEU 50 (A) 05/20/2022 0134   HGBUR NEGATIVE 05/20/2022 0134   BILIRUBINUR NEGATIVE 05/20/2022 0134   KETONESUR NEGATIVE 05/20/2022 0134   PROTEINUR 100 (A) 05/20/2022 0134   NITRITE NEGATIVE 05/20/2022 0134   LEUKOCYTESUR NEGATIVE 05/20/2022 0134     LOS: 12 days     Richarda Osmond, DO [facility] 05/30/2022, 11:03 AM    Staff may message me via secure chat in [place]  but this may not receive an immediate response,  please page me for urgent matters!  If 7PM-7AM, please contact night coverage [email or web address]

## 2022-05-30 NOTE — Progress Notes (Signed)
Nutrition Follow-up  DOCUMENTATION CODES:   Obesity unspecified  INTERVENTION:   -Continue MVI with minerals daily -Continue 500 mg vitamin C BID -Continue 220 mg zinc sulfate daily x 14 days -Continue 1 packet Juven BID, each packet provides 95 calories, 2.5 grams of protein (collagen), and 9.8 grams of carbohydrate (3 grams sugar); also contains 7 grams of L-arginine and L-glutamine, 300 mg vitamin C, 15 mg vitamin E, 1.2 mcg vitamin B-12, 9.5 mg zinc, 200 mg calcium, and 1.5 g  Calcium Beta-hydroxy-Beta-methylbutyrate to support wound healing  -Continue double protein portions with meals -Continue Ensure Max po daily, each supplement provides 150 kcal and 30 grams of protein  NUTRITION DIAGNOSIS:   Increased nutrient needs related to wound healing as evidenced by estimated needs.  Ongoing  GOAL:   Patient will meet greater than or equal to 90% of their needs  Progressing   MONITOR:   PO intake, Supplement acceptance, Skin, Labs  REASON FOR ASSESSMENT:   Consult Wound healing  ASSESSMENT:   Pt with hx of CHF, COPD, CAD, DM type 2, gout, HLD, HTN, hx MI, PVD and left BKA presented to ED with worsening ankle wound. Found to have osteomyelitis.  1/22- s/p aortogram  1/25- rt BKA  Reviewed I/O's: -1.2 L x 24 hours and -23.1 L since admission  UOP: 2.1 L x 24 hours   Spoke with pt, who reports feeling "ok" at time of visit. Pt reports good appetite and is eating 100% of his meals. Pt is also drinking his supplements. Discussed importance of good meal and supplement intake to promote healing.   Plan to discharge home with home health services.   Medications reviewed and include vitamin C, vitamin B-12, and ferrous sulfate.  Labs reviewed: CBGS: 163-300 (inpatient orders for glycemic control are 0-20 units insulin aspart TID with meals, 10 units insulin aspart TID with meals, and 25 units insulin glargine-yfgn daily).    Diet Order:   Diet Order             Diet  - low sodium heart healthy           Diet Carb Modified Fluid consistency: Thin; Room service appropriate? Yes  Diet effective now                   EDUCATION NEEDS:   Education needs have been addressed  Skin:  Skin Assessment: Skin Integrity Issues: Skin Integrity Issues:: Stage I, Incisions Stage I: perineum Stage III: - Incisions: s/p rt BKA  Last BM:  05/30/22 (type 4)  Height:   Ht Readings from Last 1 Encounters:  05/21/22 '6\' 4"'$  (1.93 m)    Weight:   Wt Readings from Last 1 Encounters:  05/18/22 135.5 kg    Ideal Body Weight:  79.9 kg (adjusted for bilateral BKA)  BMI:  Body mass index is 36.37 kg/m.  Estimated Nutritional Needs:   Kcal:  1610-9604  Protein:  120-135 grams  Fluid:  > 2 L    Loistine Chance, RD, LDN, [place] Registered Dietitian II Certified Diabetes Care and Education Specialist Please refer to [facility] for RD and/or RD on-call/weekend/after hours pager

## 2022-05-30 NOTE — TOC Progression Note (Signed)
Transition of Care (TOC) - Progression Note    Patient Details  Name: [name]. MRN: [id number] Date of Birth: 1956/01/15  Transition of Care [facility]) CM/SW [place], RN Phone Number: 05/30/2022, 11:10 AM  Clinical Narrative:    Met with the patient at the bedside to discuss DC plan ned needs He is open with [facility], I notified Merleen Nicely that he will need ot add an RN He has a hoyer lift at home, and got a new Hoyer pad while here, he has 2 3in1s and a wheelchair as well as a ramp' \\he'$  stated that he will not need additional DME He is agreeable to transport via EMS with this recent BKA, he is only able to get out of bed with a hoyer,   Expected Discharge Plan: [place] Barriers to Discharge: Continued Medical Work up  Expected Discharge Plan and Services   Discharge Planning Services: CM Consult   Living arrangements for the past 2 months: Single Family Home                   DME Agency: [employer]       HH Arranged: RN [place] Agency: [facility] Date [place]: 05/30/22 Time [place]: 1110 Representative spoke with at [place]: [place] ([place]) Interventions SDOH Screenings   Food Insecurity: No Food Insecurity (05/19/2022)  Housing: Low Risk  (05/19/2022)  Transportation Needs: No Transportation Needs (05/19/2022)  Recent Concern: Transportation Needs - Unmet Transportation Needs (03/21/2022)  Utilities: Not At Risk (05/19/2022)  Depression (PHQ2-9): Low Risk  (11/21/2021)  Tobacco Use: Medium Risk (05/25/2022)    Readmission Risk Interventions    02/13/2022    4:57 PM 09/29/2021   11:12 AM  Readmission Risk Prevention Plan  Transportation Screening Complete Complete  PCP or Specialist Appt within 3-5 Days  Complete  HRI or [place]  Complete  Social Work Consult for [place] Planning/Counseling  Complete  Palliative Care Screening  Not Applicable   Medication Review Press photographer) Complete Complete  PCP or Specialist appointment within 3-5 days of discharge Complete   HRI or [place] Complete   SW Recovery Care/Counseling Consult Complete   [place] Not Applicable

## 2022-05-31 DIAGNOSIS — M869 Osteomyelitis, unspecified: Secondary | ICD-10-CM | POA: Diagnosis not present

## 2022-05-31 DIAGNOSIS — Z89511 Acquired absence of right leg below knee: Secondary | ICD-10-CM | POA: Diagnosis not present

## 2022-05-31 DIAGNOSIS — N179 Acute kidney failure, unspecified: Secondary | ICD-10-CM | POA: Diagnosis not present

## 2022-05-31 DIAGNOSIS — E875 Hyperkalemia: Secondary | ICD-10-CM | POA: Diagnosis not present

## 2022-05-31 LAB — GLUCOSE, CAPILLARY
Glucose-Capillary: 169 mg/dL — ABNORMAL HIGH (ref 70–99)
Glucose-Capillary: 279 mg/dL — ABNORMAL HIGH (ref 70–99)
Glucose-Capillary: 233 mg/dL — ABNORMAL HIGH (ref 70–99)
Glucose-Capillary: 235 mg/dL — ABNORMAL HIGH (ref 70–99)

## 2022-05-31 LAB — CBC
HCT: 27.7 % — ABNORMAL LOW (ref 39.0–52.0)
Hemoglobin: 8.6 g/dL — ABNORMAL LOW (ref 13.0–17.0)
MCH: 28.3 pg (ref 26.0–34.0)
MCHC: 31 g/dL (ref 30.0–36.0)
MCV: 91.1 fL (ref 80.0–100.0)
Platelets: 386 10*3/uL (ref 150–400)
RBC: 3.04 MIL/uL — ABNORMAL LOW (ref 4.22–5.81)
RDW: 15.8 % — ABNORMAL HIGH (ref 11.5–15.5)
WBC: 11 10*3/uL — ABNORMAL HIGH (ref 4.0–10.5)
nRBC: 0 % (ref 0.0–0.2)

## 2022-05-31 NOTE — Progress Notes (Signed)
PROGRESS NOTE    [name].   KNL:[id number] DOB: August 22, 1955  DOA: 05/18/2022 Date of Service: 05/31/22 PCP: [name], MD  Brief Narrative / Hospital Course:  This is a pleasant 67 year old gentleman with a history of COPD, chronic respiratory failure on home O2, CAD status post CABG, insulin-dependent type 2 diabetes, OSA on BiPAP at night, BPH hypertension, hypothyroidism and paroxysmal atrial fibrillation on Eliquis and amiodarone who has peripheral arterial disease status post left BKA and is admitted to the hospital with concern for right ankle osteomyelitis after an injury in December 2023.  He was seen in consultation by podiatry, who recommends continuation of empiric IV antibiotics, underwent angiogram and angioplasty of the right anterior tibial artery with vascular surgery on 01/22. After consideration, opted for R BKA 01/24 given low likelihood of healing with non-surgical management. Awaiting SNF placement. Stopped abx 01/26 Friday. Planning SNF rehab placement after the  weekend, however days are used up and cannot afford out of pocket, confirming DME and HH  - medically stable for dc 01/30 - however wife is ill with COVID and is in isolation until Friday. She provides him with a good portion of care and will be unable to do so while isolating and ill. Patient is SNF level care appropriate and has new amputation so would not be safe to dc home without continuous supervision and care. His dc will have to be delayed until safe home plan can be established. He has not been able to independently transfer out of bed post-op yet. He will continue to work with PT and we can continue to monitor for when patient will be safe to dc home. Likely 2/2 afternoon.  Consultants:  Podiatry Vascular Surgery   Procedures: 05/21/22 RLE Angiogram with angioplasty  05/24/22 R below-knee amputation   ASSESSMENT & PLAN:   Principal Problem:   Non-healing wound of lower  extremity, initial encounter Active Problems:   Acute osteomyelitis of right foot (HCC)   PAD (peripheral artery disease) (HCC)   S/P BKA (below knee amputation), left (HCC)   AKI (acute kidney injury) ([place])   Hyperkalemia   Chronic anticoagulation   AF (paroxysmal atrial fibrillation) ([place])   Uncontrolled type 2 diabetes mellitus with hypoglycemia, with long-term current use of insulin (HCC)   OSA (obstructive sleep apnea)   Chronic respiratory failure with hypoxia (HCC)   COPD (chronic obstructive pulmonary disease) (HCC)   Chronic diastolic CHF (congestive heart failure) (HCC)   CAD with Hx of CABG   Essential hypertension   BPH (benign prostatic hyperplasia)  Acute osteomyelitis of right foot and ankle (HCC) Nonhealing wound right ankle PAD s/p right BKA 1/25 S/p angiogram with angioplasty 1/22 with vascular surgery Wound appears to be healing well completed cefepime and vancomycin 05/25/22  Plan home w/ HH - SNF days used up and pt/family cannot afford rehab care Vascular team ok for Eliquis, d/c clopidogrel   Continue PT/OT Analgesia PRN  Anemia Chronic, worse now d/t likely myelosuppression/chronic illness + recent surgery 1 unit PRBC this admission, repeat HH improved  Monitor CBC Caution given fall risk and AC/ASA  Uncontrolled type 2 diabetes mellitus with hypoglycemia, with long-term current use of insulin ([place]) Blood sugar in the 300s on admission initially Continue home basal insulin - basal increased Sliding scale coverage increased    COPD OSA on home BiPAP qhs Chronic respiratory failure on home O2 Patient is at baseline Continue home Pulmicort and inhalers Continue BiPAP and O2 at night and  anytime he is sleeping Encourage CPAP use QHS   PAF on Chronic anticoagulation CAD with Hx of CABG Chronic Essential hypertension  HFpEF- EF 55-60% Continue aspirin + Eliquis based on cardiology recs Continue ezetimibe, Imdur, amiodarone Continue losartan    BPH (benign prostatic hyperplasia) Continue finasteride and tamsulosin  Hyperkalemia - resolved Monitor BMP   AKI - resolved Monitor BMP   Pseudohyponatremia d/t hyperglycemia Monitor BMP  DVT prophylaxis: eliquis Pertinent IV fluids/nutrition: no continuous IV fluids  Central lines / invasive devices: none   Code Status: FULL CODE   Current Admission Status: inpatient   TOC needs / Dispo plan: will need SNF rehab, unable to afford. Will go home with [facility] Barriers to discharge / significant pending items: s/p BKA 1/25, plan was for discharge to SNF but payer source is a limiting factor. Will dc home with HH once safe dispo plan and caregiver have been established.  Subjective / Brief ROS:  Pt reports physically feeling well today. Denies complaints. His wife is still quite ill at home.  Family Communication: none at this time, patient communicates directly with wife   Objective Findings:  Vitals:   05/30/22 0851 05/30/22 1651 05/30/22 2127 05/30/22 2319  BP: 138/74 113/62  119/67  Pulse: 92 95  90  Resp: '18 16  18  '$ Temp: 98.1 F (36.7 C) 98 F (36.7 C)  98.2 F (36.8 C)  TempSrc:      SpO2: 96% 98% 93% 97%  Weight:      Height:        Intake/Output Summary (Last 24 hours) at 05/31/2022 0716 Last data filed at 05/31/2022 0601 Gross per 24 hour  Intake 0 ml  Output 2500 ml  Net -2500 ml    Filed Weights   05/18/22 2150  Weight: 135.5 kg    Examination:  Physical Exam Constitutional:      Appearance: Normal appearance. He is obese. He is not ill-appearing.  Cardiovascular:     Rate and Rhythm: Normal rate and regular rhythm.  Pulmonary:     Effort: Pulmonary effort is normal.     Breath sounds: Normal breath sounds.  Abdominal:     Palpations: Abdomen is soft.  Musculoskeletal:     Right lower leg: Tenderness present.     Left lower leg: Normal. No tenderness.     Comments: LLE BKA, RLE BKA     Right Lower Extremity: Right leg is amputated below  knee. (surgical incision healing well. stables in place. no signs of infection. no dignificant amount of bleeding)    Left Lower Extremity: Left leg is amputated below knee.  Skin:    General: Skin is dry.  Neurological:     General: No focal deficit present.     Mental Status: He is alert and oriented to person, place, and time.  Psychiatric:        Behavior: Behavior normal.        Thought Content: Thought content normal.     Scheduled Medications:   amiodarone  200 mg Oral Daily   apixaban  5 mg Oral BID   ascorbic acid  500 mg Oral BID   aspirin EC  81 mg Oral Daily   budesonide  0.5 mg Nebulization BID   cyanocobalamin  5,000 mcg Oral Daily   ezetimibe  10 mg Oral Daily   ferrous sulfate  325 mg Oral Q breakfast   finasteride  5 mg Oral Daily   gabapentin  300 mg Oral QHS  insulin aspart  0-20 Units Subcutaneous TID WC   insulin aspart  10 Units Subcutaneous TID WC   insulin glargine-yfgn  25 Units Subcutaneous Daily   isosorbide mononitrate  60 mg Oral Daily   levothyroxine  25 mcg Oral Q0600   losartan  25 mg Oral Daily   multivitamin with minerals  1 tablet Oral Daily   nutrition supplement (JUVEN)  1 packet Oral BID BM   pramipexole  2 mg Oral QHS   primidone  250 mg Oral BID   Ensure Max Protein  11 oz Oral QHS   risperiDONE  0.5 mg Oral BID   tamsulosin  0.4 mg Oral Daily   PRN Medications:  acetaminophen **OR** acetaminophen, albuterol, HYDROcodone-acetaminophen, morphine injection, ondansetron **OR** ondansetron (ZOFRAN) IV, traZODone  Antimicrobials from admission:  Anti-infectives (From admission, onward)    Start     Dose/Rate Route Frequency Ordered Stop   05/24/22 1000  vancomycin (VANCOREADY) IVPB 1500 mg/300 mL        1,500 mg 150 mL/hr over 120 Minutes Intravenous 120 min pre-op 05/23/22 1830 05/25/22 1459   05/21/22 1655  ciprofloxacin (CIPRO) IVPB  Status:  Discontinued        over 60 Minutes  Continuous PRN 05/21/22 1655 05/21/22 1737    05/21/22 1515  ciprofloxacin (CIPRO) IVPB 400 mg  Status:  Discontinued        400 mg 200 mL/hr over 60 Minutes Intravenous 60 min pre-op 05/21/22 1515 05/21/22 1736   05/19/22 1200  vancomycin (VANCOREADY) IVPB 1250 mg/250 mL  Status:  Discontinued        1,250 mg 166.7 mL/hr over 90 Minutes Intravenous Every 12 hours 05/19/22 0739 05/25/22 1511   05/19/22 0100  vancomycin (VANCOREADY) IVPB 500 mg/100 mL       See Hyperspace for full Linked Orders Report.   500 mg 100 mL/hr over 60 Minutes Intravenous  Once 05/18/22 2209 05/19/22 0241   05/18/22 2300  vancomycin (VANCOREADY) IVPB 2000 mg/400 mL       See Hyperspace for full Linked Orders Report.   2,000 mg 200 mL/hr over 120 Minutes Intravenous  Once 05/18/22 2209 05/19/22 0138   05/18/22 2300  ceFEPIme (MAXIPIME) 2 g in sodium chloride 0.9 % 100 mL IVPB  Status:  Discontinued        2 g 200 mL/hr over 30 Minutes Intravenous Every 8 hours 05/18/22 2209 05/25/22 1511   05/18/22 2210  vancomycin variable dose per unstable renal function (pharmacist dosing)  Status:  Discontinued         Does not apply See admin instructions 05/18/22 2211 05/20/22 0908   05/18/22 2145  metroNIDAZOLE (FLAGYL) tablet 500 mg        500 mg Oral Every 12 hours 05/18/22 2116 05/25/22 2144   05/18/22 2000  metroNIDAZOLE (FLAGYL) IVPB 500 mg  Status:  Discontinued        500 mg 100 mL/hr over 60 Minutes Intravenous  Once 05/18/22 1948 05/18/22 2144   05/18/22 2000  ceFEPIme (MAXIPIME) 2 g in sodium chloride 0.9 % 100 mL IVPB  Status:  Discontinued        2 g 200 mL/hr over 30 Minutes Intravenous  Once 05/18/22 1959 05/18/22 2209   05/18/22 2000  vancomycin (VANCOCIN) IVPB 1000 mg/200 mL premix  Status:  Discontinued        1,000 mg 200 mL/hr over 60 Minutes Intravenous  Once 05/18/22 1959 05/18/22 2209      Data Reviewed:  I  have personally reviewed the following.  CBC: Recent Labs  Lab 05/27/22 0506 05/27/22 1743 05/28/22 0822 05/29/22 0237  05/30/22 0621 05/31/22 0626  WBC 11.4*  --  11.7* 11.2* 12.1* 11.0*  HGB 6.9* 8.1* 7.9* 7.6* 8.6* 8.6*  HCT 21.7* 25.9* 25.5* 24.8* 27.4* 27.7*  MCV 88.9  --  90.1 90.2 89.8 91.1  PLT 302  --  342 367 392 546    Basic Metabolic Panel: Recent Labs  Lab 05/26/22 1146 05/27/22 0506 05/28/22 0822 05/29/22 0237 05/30/22 0621  NA 128* 131* 137 134* 136  K 4.2 4.2 4.1 3.9 3.9  CL 100 102 106 106 107  CO2 22 20* '24 25 25  '$ GLUCOSE 239* 272* 195* 211* 125*  BUN 38* 41* 31* 30* 25*  CREATININE 0.99 0.93 0.84 0.77 0.66  CALCIUM 8.3* 8.5* 8.7* 8.3* 8.6*    CBG: Recent Labs  Lab 05/29/22 2115 05/30/22 0853 05/30/22 1224 05/30/22 1607 05/30/22 2228  GLUCAP 282* 163* 224* 228* 289*    Most Recent Urinalysis On File:     Component Value Date/Time   COLORURINE YELLOW (A) 05/20/2022 0134   APPEARANCEUR CLEAR (A) 05/20/2022 0134   LABSPEC 1.013 05/20/2022 0134   PHURINE 6.0 05/20/2022 0134   GLUCOSEU 50 (A) 05/20/2022 0134   HGBUR NEGATIVE 05/20/2022 0134   BILIRUBINUR NEGATIVE 05/20/2022 0134   KETONESUR NEGATIVE 05/20/2022 0134   PROTEINUR 100 (A) 05/20/2022 0134   NITRITE NEGATIVE 05/20/2022 0134   LEUKOCYTESUR NEGATIVE 05/20/2022 0134     LOS: 33 days     Richarda Osmond, DO [facility] 05/31/2022, 7:16 AM    Staff may message me via secure chat in [place]  but this may not receive an immediate response,  please page me for urgent matters!  If 7PM-7AM, please contact night coverage [email or web address]

## 2022-05-31 NOTE — Plan of Care (Signed)

## 2022-05-31 NOTE — Progress Notes (Signed)
Physical Therapy Treatment Patient Details Name: [name]. MRN: [id number] DOB: 1955/12/11 Today's Date: 05/31/2022   History of Present Illness Pt is a 68 y/o M admitted on 05/18/22 with concern for right ankle osteomyelitis after an injury in December 2023. Pt was seen in consultation by podiatry, who recommended continuation of empiric IV antibiotics, underwent angiogram & angioplasty of the right anterior tibial artery with vascular surgery on 01/22. After consideration, opted for & underwent R BKA 01/25 given low likelihood of healing with non-surgical management. PMH: COPD, chronic respiratory failure on home O2, CAD s/p CABG, IDDM2, OSA on bipap at night, BPH HTN, hypothyroidism, paroxysmal a-fib on eliquis & amiodarone, PAD, L BKA    PT Comments    Pt was long sitting in bed upon arriving. He agrees to session requesting to have BM. Author assisted pt on/off bedpan via rolling. Session then progressed to sitting EOB. Continues to require extensive assistance to achieve EOB sitting. Pt requested to perform sideboard transfer to recliner. He required extensive max assist to safely transfer to recliner with increased time and Vcs throughout for technique improvements. Highly recommend hoyer transfer for RN staff. Pt is progressing but will need extensive assistance to maximize independence with all ADLs.    Recommendations for follow up therapy are one component of a multi-disciplinary discharge planning process, led by the attending physician.  Recommendations may be updated based on patient status, additional functional criteria and insurance authorization.  Follow Up Recommendations  Other (comment) (pt is out of rehab days. Planning to DC home with spouse's assistance + HH services.) Can patient physically be transported by private vehicle: No   Assistance Recommended at Discharge Frequent or constant Supervision/Assistance  Patient can return home with the following A lot of  help with walking and/or transfers;A lot of help with bathing/dressing/bathroom;Assistance with cooking/housework;Direct supervision/assist for medications management;Direct supervision/assist for financial management;Assist for transportation;Help with stairs or ramp for entrance   Equipment Recommendations  None recommended by PT (pt endorses having all equipment needs met)       Precautions / Restrictions Precautions Precautions: Fall Restrictions Weight Bearing Restrictions: Yes RLE Weight Bearing: Non weight bearing     Mobility  Bed Mobility Overal bed mobility: Needs Assistance Bed Mobility: Rolling Rolling: Max assist Supine to sit: Max assist Sit to supine: Min assist, Mod assist General bed mobility comments: Pt performed rolling R to short sit with increased time + max assist to fully achieve EOB short sit.    Transfers Overall transfer level: Needs assistance Equipment used: Sliding board Transfers: Bed to chair/wheelchair/BSC     Lateral/Scoot Transfers: Max assist, With slide board, From elevated surface General transfer comment: Pt required max assist of one to slideboard transfer from elevated bed height to lower recliner surface. Highly recommend use of mechanical lift to return to bed from recliner. Transfer via Lift Equipment:  (hoyer transfer from bed to recliner)  Ambulation/Gait      General Gait Details: none ambulatory at baseline    Balance Overall balance assessment: Needs assistance Sitting-balance support: Bilateral upper extremity supported Sitting balance-Leahy Scale: Fair Sitting balance - Comments: Is reliant on BUE support to maintain balance. Core weakness is apparent and overall sitting balance is only fair. Acute PT will contintue to focus on improving strength and balance in sitting       Standing balance comment: unable     Cognition Arousal/Alertness: Awake/alert Behavior During Therapy: WFL for tasks assessed/performed Overall  Cognitive Status: Within Functional Limits for  tasks assessed      General Comments: Pt is A an O x4               Pertinent Vitals/Pain Pain Assessment Pain Assessment: 0-10 Pain Score: 6  Faces Pain Scale: Hurts a little bit Pain Location: R limb Pain Descriptors / Indicators: Discomfort Pain Intervention(s): Limited activity within patient's tolerance, Monitored during session, Repositioned, Premedicated before session     PT Goals (current goals can now be found in the care plan section) Acute Rehab PT Goals Patient Stated Goal: get home safely Progress towards PT goals: Progressing toward goals    Frequency    7X/week      PT Plan Current plan remains appropriate       AM-PAC PT "6 Clicks" Mobility   Outcome Measure  Help needed turning from your back to your side while in a flat bed without using bedrails?: A Lot Help needed moving from lying on your back to sitting on the side of a flat bed without using bedrails?: A Lot Help needed moving to and from a bed to a chair (including a wheelchair)?: Total Help needed standing up from a chair using your arms (e.g., wheelchair or bedside chair)?: Total Help needed to walk in hospital room?: Total Help needed climbing 3-5 steps with a railing? : Total 6 Click Score: 8    End of Session   Activity Tolerance: Patient tolerated treatment well;Patient limited by fatigue;Patient limited by pain Patient left: in chair;with call bell/phone within reach;with chair alarm set Nurse Communication: Mobility status PT Visit Diagnosis: Muscle weakness (generalized) (M62.81);Other abnormalities of gait and mobility (R26.89);Pain Pain - Right/Left: Right Pain - part of body: Leg     Time: 1950-9326 PT Time Calculation (min) (ACUTE ONLY): 25 min  Charges:  $Therapeutic Activity: 23-37 mins                     Julaine Fusi PTA 05/31/22, 4:22 PM

## 2022-05-31 NOTE — Progress Notes (Signed)
Occupational Therapy Treatment Patient Details Name: [name]. MRN: [id number] DOB: 05-30-1955 Today's Date: 05/31/2022   History of present illness Pt is a 67 y/o M admitted on 05/18/22 with concern for right ankle osteomyelitis after an injury in December 2023. Pt was seen in consultation by podiatry, who recommended continuation of empiric IV antibiotics, underwent angiogram & angioplasty of the right anterior tibial artery with vascular surgery on 01/22. After consideration, opted for & underwent R BKA 01/25 given low likelihood of healing with non-surgical management. PMH: COPD, chronic respiratory failure on home O2, CAD s/p CABG, IDDM2, OSA on bipap at night, BPH HTN, hypothyroidism, paroxysmal a-fib on eliquis & amiodarone, PAD, L BKA   OT comments  Upon entering the room, pt supine in bed and calling out for bed pan. Pt needing max A to roll in bed with use of rail for placement. Pt declined self care tasks while on bed pan. He did demonstrate x 7 reps with use of incentive spirometer with min cuing for technique. Pt reports decreased pain today and feeling like his new incision was healing well. OT discussed if pt was utilizing inspection mirror at home to check for skin integrity. He reports he did "a few time" and encouraged to do that when he returns home. Pt verbalized understanding. Pt doesn't feel he can void further. He rolls with max A again and bed pan removed with no void taking place. Pt then calls out, "I think it is coming out now. Hurry!" Pt rolling with max A and placed back on bed pan. RN notified. Pt endorses needing some time to attempt emptying his bowels. Call bell and all needed items within reach upon exiting the room.    Recommendations for follow up therapy are one component of a multi-disciplinary discharge planning process, led by the attending physician.  Recommendations may be updated based on patient status, additional functional criteria and insurance  authorization.    Follow Up Recommendations  Skilled nursing-short term rehab (<3 hours/day)     Assistance Recommended at Discharge Frequent or constant Supervision/Assistance  Patient can return home with the following  Two people to help with bathing/dressing/bathroom;Two people to help with walking and/or transfers;Help with stairs or ramp for entrance;Assistance with cooking/housework;Assist for transportation   Equipment Recommendations  Hospital bed       Precautions / Restrictions Precautions Precautions: Fall       Mobility Bed Mobility Overal bed mobility: Needs Assistance Bed Mobility: Rolling Rolling: Max assist         General bed mobility comments: increased time and use of bed rails for hand placement    Transfers                             ADL either performed or assessed with clinical judgement   ADL Overall ADL's : Needs assistance/impaired                                       General ADL Comments: Pt rolling L <> R with max A to place bedpan x2 during session.    Extremity/Trunk Assessment Upper Extremity Assessment Upper Extremity Assessment: Generalized weakness   Lower Extremity Assessment Lower Extremity Assessment: Generalized weakness        Vision Patient Visual Report: No change from baseline  Cognition Arousal/Alertness: Awake/alert Behavior During Therapy: WFL for tasks assessed/performed Overall Cognitive Status: Within Functional Limits for tasks assessed                                 General Comments: Pt is A an O x4                   Pertinent Vitals/ Pain       Pain Assessment Pain Assessment: No/denies pain         Frequency  Min 2X/week        Progress Toward Goals  OT Goals(current goals can now be found in the care plan section)  Progress towards OT goals: Progressing toward goals  Acute Rehab OT Goals Patient Stated Goal: to get  stronger OT Goal Formulation: With patient Time For Goal Achievement: 06/08/22 Potential to Achieve Goals: Good  Plan Discharge plan remains appropriate;Frequency remains appropriate       AM-PAC OT "6 Clicks" Daily Activity     Outcome Measure   Help from another person eating meals?: None Help from another person taking care of personal grooming?: A Little Help from another person toileting, which includes using toliet, bedpan, or urinal?: Total Help from another person bathing (including washing, rinsing, drying)?: A Lot Help from another person to put on and taking off regular upper body clothing?: A Little Help from another person to put on and taking off regular lower body clothing?: Total 6 Click Score: 14    End of Session    OT Visit Diagnosis: Other abnormalities of gait and mobility (R26.89);Pain Pain - Right/Left: Right Pain - part of body: Knee;Leg   Activity Tolerance Patient tolerated treatment well   Patient Left in bed;with call bell/phone within reach;with bed alarm set   Nurse Communication Mobility status;Other (comment) (placed back on bedpan per pt request)        Time: 2694-8546 OT Time Calculation (min): 15 min  Charges: OT General Charges $OT Visit: 1 Visit OT Treatments $Self Care/Home Management : 8-22 mins  Darleen Crocker, MS, OTR/L , CBIS ascom [phone]  05/31/22, 3:22 PM

## 2022-05-31 NOTE — Care Management Important Message (Signed)
Important Message  Patient Details  Name: [name]. MRN: [id number] Date of Birth: [birth year]   Medicare Important Message Given:  Yes     [name] 05/31/2022, 2:47 PM

## 2022-05-31 NOTE — Plan of Care (Signed)

## 2022-06-01 DIAGNOSIS — S81809A Unspecified open wound, unspecified lower leg, initial encounter: Secondary | ICD-10-CM | POA: Diagnosis not present

## 2022-06-01 LAB — GLUCOSE, CAPILLARY
Glucose-Capillary: 135 mg/dL — ABNORMAL HIGH (ref 70–99)
Glucose-Capillary: 242 mg/dL — ABNORMAL HIGH (ref 70–99)
Glucose-Capillary: 180 mg/dL — ABNORMAL HIGH (ref 70–99)
Glucose-Capillary: 157 mg/dL — ABNORMAL HIGH (ref 70–99)

## 2022-06-01 MED ORDER — INSULIN ASPART 100 UNIT/ML IJ SOLN
14.0000 [IU] | Freq: Three times a day (TID) | INTRAMUSCULAR | Status: DC
  Administered 2022-06-01 – 2022-06-05 (×10): 14 [IU] via SUBCUTANEOUS
  Filled 2022-06-01 (×10): qty 1

## 2022-06-01 MED ORDER — INSULIN GLARGINE-YFGN 100 UNIT/ML ~~LOC~~ SOLN
30.0000 [IU] | Freq: Every day | SUBCUTANEOUS | Status: DC
  Administered 2022-06-02: 30 [IU] via SUBCUTANEOUS
  Filled 2022-06-01: qty 0.3

## 2022-06-01 NOTE — Progress Notes (Signed)
Progress Note    06/01/2022 11:15 AM 8 Days Post-Op  Subjective:  Patient is postop day 8 following a right below-knee amputation.  Several months ago he also underwent a left below-knee amputation.  He notes that he has been up out of bed to the chair with physical therapy today.  He endorses having good pain control with prescribed medications.  No complaints overnight. Vital all remain stable.    Vitals:   06/01/22 0807 06/01/22 0821  BP:  128/73  Pulse:  90  Resp:  18  Temp:  98 F (36.7 C)  SpO2: 98% 95%   Physical Exam: Cardiac:  RRR   Lungs:  Clear throughout with normal effort.  Incisions:   Right stump staples, C/D/I  Extremities:  Bilateral BKA's Healing well. Staples in place without drainage today.   Abdomen:  Positive Bowel sounds, soft NT/ND   Neurologic:  AAOX3   CBC    Component Value Date/Time   WBC 11.0 (H) 05/31/2022 0626   RBC 3.04 (L) 05/31/2022 0626   HGB 8.6 (L) 05/31/2022 0626   HGB 10.7 (L) 05/15/2022 1440   HCT 27.7 (L) 05/31/2022 0626   HCT 33.4 (L) 05/15/2022 1440   PLT 386 05/31/2022 0626   PLT 394 05/15/2022 1440   MCV 91.1 05/31/2022 0626   MCV 89 05/15/2022 1440   MCV 96 07/12/2011 0919   MCH 28.3 05/31/2022 0626   MCHC 31.0 05/31/2022 0626   RDW 15.8 (H) 05/31/2022 0626   RDW 12.5 05/15/2022 1440   RDW 15.2 (H) 07/12/2011 0919   LYMPHSABS 1.0 05/18/2022 1527   LYMPHSABS 1.2 07/12/2011 0919   MONOABS 0.6 05/18/2022 1527   MONOABS 0.8 (H) 07/12/2011 0919   EOSABS 0.2 05/18/2022 1527   EOSABS 0.4 07/12/2011 0919   BASOSABS 0.1 05/18/2022 1527   BASOSABS 0.1 07/12/2011 0919    BMET    Component Value Date/Time   NA 136 05/30/2022 0621   NA 141 07/12/2011 0919   K 3.9 05/30/2022 0621   K 4.4 07/12/2011 0919   CL 107 05/30/2022 0621   CL 102 07/12/2011 0919   CO2 25 05/30/2022 0621   CO2 29 07/12/2011 0919   GLUCOSE 125 (H) 05/30/2022 0621   GLUCOSE 76 07/12/2011 0919   BUN 25 (H) 05/30/2022 0621   BUN 16 05/05/2013  1022   CREATININE 0.66 05/30/2022 0621   CREATININE 0.71 05/05/2013 1022   CALCIUM 8.6 (L) 05/30/2022 0621   CALCIUM 8.8 07/12/2011 0919   GFRNONAA >60 05/30/2022 0621   GFRNONAA >60 05/05/2013 1022   GFRAA >60 01/06/2020 1853   GFRAA >60 05/05/2013 1022    INR    Component Value Date/Time   INR 1.3 (H) 05/18/2022 1527     Intake/Output Summary (Last 24 hours) at 06/01/2022 1115 Last data filed at 06/01/2022 2353 Gross per 24 hour  Intake --  Output 1100 ml  Net -1100 ml     Assessment/Plan:  67 y.o. male is s/p Right below the knee amputation 8 Days Post-Op   Patient notes that his pain is well-controlled with current medical regimen, continue with as needed use of pain medication Patient to continue to work with physical therapy, patient expresses interest in going to rehab at discharge.  I think this is a very good idea as he previously did not go to rehab and has definitely noticed some significant weakness and difficulty with transfers.  I believe that rehab will allow him to have a better quality of  life once he is able to return home. No drainage on postoperative dressing today, no significant bleeding noted and staples are dry and intact. Dressing changed today.  Reinforced the importance of keeping his knee straight to avoid contractures.       Per Vascular Surgery Plan okay for discharge to home today  DVT prophylaxis:  Lovenox   Drema Pry [facility] 06/01/2022 11:15 AM

## 2022-06-01 NOTE — Inpatient Diabetes Management (Addendum)
Inpatient Diabetes Program Recommendations  AACE/ADA: New Consensus Statement on Inpatient Glycemic Control   Target Ranges:  Prepandial:   less than 140 mg/dL      Peak postprandial:   less than 180 mg/dL (1-2 hours)      Critically ill patients:  140 - 180 mg/dL    Latest Reference Range & Units 05/31/22 08:18 05/31/22 11:28 05/31/22 17:20 05/31/22 21:13  Glucose-Capillary 70 - 99 mg/dL 169 (H) 279 (H) 233 (H) 235 (H)   Review of Glycemic Control  Diabetes history: DM2 Outpatient Diabetes medications: Semglee 40 units QAM, Novolog 15 units TID with meals Current orders for Inpatient glycemic control: Semglee 25 units QAM, Novolog 10 units TID with meals, Novolog 0-20 units TID with meals  Inpatient Diabetes Program Recommendations:    Insulin: Please consider increasing Semglee to 30 units QAM and increasing meal coverage to Novolog 14 units TID with meals.  Thanks, Barnie Alderman, RN, MSN, [place] [facility] Coordinator Inpatient Diabetes Program [phone] (Team Pager from 8am to [place])

## 2022-06-01 NOTE — Progress Notes (Signed)
PROGRESS NOTE    [name].   HKV:[id number] DOB: [birth year]  DOA: 05/18/2022 Date of Service: 06/01/22 PCP: [name], MD  Brief Narrative / Hospital Course:  This is a pleasant 67 year old gentleman with a history of COPD, chronic respiratory failure on home O2, CAD status post CABG, insulin-dependent type 2 diabetes, OSA on BiPAP at night, BPH hypertension, hypothyroidism and paroxysmal atrial fibrillation on Eliquis and amiodarone who has peripheral arterial disease status post left BKA and is admitted to the hospital with concern for right ankle osteomyelitis after an injury in December 2023.  He was seen in consultation by podiatry, who recommends continuation of empiric IV antibiotics, underwent angiogram and angioplasty of the right anterior tibial artery with vascular surgery on 01/22. After consideration, opted for R BKA 01/24 given low likelihood of healing with non-surgical management. Awaiting SNF placement. Stopped abx 01/26 Friday. Planning SNF rehab placement after the  weekend, however days are used up and cannot afford out of pocket, confirming DME and HH  - medically stable for dc 01/30 - however wife is ill with COVID and is in isolation until Friday. She provides him with a good portion of care and will be unable to do so while isolating and ill. Patient is SNF level care appropriate and has new amputation so would not be safe to dc home without continuous supervision and care. His dc will have to be delayed until safe home plan can be established. He has not been able to independently transfer out of bed post-op yet. He will continue to work with PT and we can continue to monitor for when patient will be safe to dc home. He reports no one able to meet him at home today for discharge but reports he can and will arrange that for tomorrow  Consultants:  Podiatry Vascular Surgery   Procedures: 05/21/22 RLE Angiogram with angioplasty  05/24/22 R below-knee  amputation   ASSESSMENT & PLAN:   Principal Problem:   Non-healing wound of lower extremity, initial encounter Active Problems:   Acute osteomyelitis of right foot (HCC)   PAD (peripheral artery disease) (HCC)   S/P BKA (below knee amputation), left (HCC)   AKI (acute kidney injury) ([place])   Hyperkalemia   Chronic anticoagulation   AF (paroxysmal atrial fibrillation) ([place])   Uncontrolled type 2 diabetes mellitus with hypoglycemia, with long-term current use of insulin (HCC)   OSA (obstructive sleep apnea)   Chronic respiratory failure with hypoxia (HCC)   COPD (chronic obstructive pulmonary disease) (HCC)   Chronic diastolic CHF (congestive heart failure) ([place])   CAD with Hx of CABG   Essential hypertension   BPH (benign prostatic hyperplasia)  Acute osteomyelitis of right foot and ankle ([place]) Nonhealing wound right ankle PAD s/p right BKA 1/25 S/p angiogram with angioplasty 1/22 with vascular surgery Wound appears to be healing well completed cefepime and vancomycin 05/25/22  Plan home w/ HH - SNF days used up and pt/family cannot afford rehab care Vascular team ok for Eliquis, d/c clopidogrel   Continue PT/OT Analgesia PRN Vascular surgery f/u 2 weeks Home health orders placed today  Anemia Chronic, worse now d/t likely myelosuppression/chronic illness + recent surgery 1 unit PRBC this admission, repeat HH improved  Monitor CBC Caution given fall risk and AC/ASA  Uncontrolled type 2 diabetes mellitus with hypoglycemia, with long-term current use of insulin ([place]) Blood sugar in the 300s on admission initially Continue home basal insulin - basal and bolus increased today Sliding scale  coverage increased    COPD OSA on home BiPAP qhs Chronic respiratory failure on home O2 Patient is at baseline Continue home Pulmicort and inhalers Continue BiPAP and O2 at night and anytime he is sleeping Encourage CPAP use QHS   PAF on Chronic anticoagulation CAD with Hx of  CABG Chronic Essential hypertension  HFpEF- EF 55-60% Continue aspirin + Eliquis based on cardiology recs Continue ezetimibe, Imdur, amiodarone Continue losartan   BPH (benign prostatic hyperplasia) Continue finasteride and tamsulosin  Hyperkalemia - resolved Monitor BMP   AKI - resolved Monitor BMP   Pseudohyponatremia d/t hyperglycemia Monitor BMP  DVT prophylaxis: eliquis Pertinent IV fluids/nutrition: no continuous IV fluids  Central lines / invasive devices: none   Code Status: FULL CODE   Current Admission Status: inpatient   TOC needs / Dispo plan: will need SNF rehab, unable to afford. Will go home with [facility] Barriers to discharge / significant pending items: s/p BKA 1/25, plan was for discharge to SNF but payer source is a limiting factor. Will dc home with HH once safe dispo plan and caregiver have been established. Will be tomorrow  Subjective / Brief ROS:  Pt reports physically feeling well today. Denies complaints. Reports his wife is still quite ill at home.  Family Communication: none @ bedside   Objective Findings:  Vitals:   05/31/22 1718 06/01/22 0009 06/01/22 0807 06/01/22 0821  BP: 120/66 130/63  128/73  Pulse: 99 90  90  Resp: '18 18  18  '$ Temp: 98.6 F (37 C) 98.5 F (36.9 C)  98 F (36.7 C)  TempSrc:      SpO2: 99% 95% 98% 95%  Weight:      Height:        Intake/Output Summary (Last 24 hours) at 06/01/2022 1129 Last data filed at 06/01/2022 9476 Gross per 24 hour  Intake --  Output 1100 ml  Net -1100 ml    Filed Weights   05/18/22 2150  Weight: 135.5 kg    Examination:  Physical Exam Constitutional:      Appearance: Normal appearance. He is obese. He is not ill-appearing.  Cardiovascular:     Rate and Rhythm: Normal rate and regular rhythm.  Pulmonary:     Effort: Pulmonary effort is normal.     Breath sounds: Normal breath sounds.  Abdominal:     Palpations: Abdomen is soft.  Musculoskeletal:     Right lower leg: Tenderness  present.     Left lower leg: Normal. No tenderness.     Comments: LLE BKA, RLE BKA     Right Lower Extremity: Right leg is amputated below knee. (surgical incision healing well. stables in place. no signs of infection. no dignificant amount of bleeding)    Left Lower Extremity: Left leg is amputated below knee.  Skin:    General: Skin is dry.  Neurological:     General: No focal deficit present.     Mental Status: He is alert and oriented to person, place, and time.  Psychiatric:        Behavior: Behavior normal.        Thought Content: Thought content normal.     Scheduled Medications:   amiodarone  200 mg Oral Daily   apixaban  5 mg Oral BID   ascorbic acid  500 mg Oral BID   aspirin EC  81 mg Oral Daily   budesonide  0.5 mg Nebulization BID   cyanocobalamin  5,000 mcg Oral Daily   ezetimibe  10  mg Oral Daily   ferrous sulfate  325 mg Oral Q breakfast   finasteride  5 mg Oral Daily   gabapentin  300 mg Oral QHS   insulin aspart  0-20 Units Subcutaneous TID WC   insulin aspart  10 Units Subcutaneous TID WC   insulin glargine-yfgn  25 Units Subcutaneous Daily   isosorbide mononitrate  60 mg Oral Daily   levothyroxine  25 mcg Oral Q0600   losartan  25 mg Oral Daily   multivitamin with minerals  1 tablet Oral Daily   nutrition supplement (JUVEN)  1 packet Oral BID BM   pramipexole  2 mg Oral QHS   primidone  250 mg Oral BID   Ensure Max Protein  11 oz Oral QHS   risperiDONE  0.5 mg Oral BID   tamsulosin  0.4 mg Oral Daily   PRN Medications:  acetaminophen **OR** acetaminophen, albuterol, HYDROcodone-acetaminophen, morphine injection, ondansetron **OR** ondansetron (ZOFRAN) IV, traZODone  Antimicrobials from admission:  Anti-infectives (From admission, onward)    Start     Dose/Rate Route Frequency Ordered Stop   05/24/22 1000  vancomycin (VANCOREADY) IVPB 1500 mg/300 mL        1,500 mg 150 mL/hr over 120 Minutes Intravenous 120 min pre-op 05/23/22 1830 05/25/22 1459    05/21/22 1655  ciprofloxacin (CIPRO) IVPB  Status:  Discontinued        over 60 Minutes  Continuous PRN 05/21/22 1655 05/21/22 1737   05/21/22 1515  ciprofloxacin (CIPRO) IVPB 400 mg  Status:  Discontinued        400 mg 200 mL/hr over 60 Minutes Intravenous 60 min pre-op 05/21/22 1515 05/21/22 1736   05/19/22 1200  vancomycin (VANCOREADY) IVPB 1250 mg/250 mL  Status:  Discontinued        1,250 mg 166.7 mL/hr over 90 Minutes Intravenous Every 12 hours 05/19/22 0739 05/25/22 1511   05/19/22 0100  vancomycin (VANCOREADY) IVPB 500 mg/100 mL       See Hyperspace for full Linked Orders Report.   500 mg 100 mL/hr over 60 Minutes Intravenous  Once 05/18/22 2209 05/19/22 0241   05/18/22 2300  vancomycin (VANCOREADY) IVPB 2000 mg/400 mL       See Hyperspace for full Linked Orders Report.   2,000 mg 200 mL/hr over 120 Minutes Intravenous  Once 05/18/22 2209 05/19/22 0138   05/18/22 2300  ceFEPIme (MAXIPIME) 2 g in sodium chloride 0.9 % 100 mL IVPB  Status:  Discontinued        2 g 200 mL/hr over 30 Minutes Intravenous Every 8 hours 05/18/22 2209 05/25/22 1511   05/18/22 2210  vancomycin variable dose per unstable renal function (pharmacist dosing)  Status:  Discontinued         Does not apply See admin instructions 05/18/22 2211 05/20/22 0908   05/18/22 2145  metroNIDAZOLE (FLAGYL) tablet 500 mg        500 mg Oral Every 12 hours 05/18/22 2116 05/25/22 2144   05/18/22 2000  metroNIDAZOLE (FLAGYL) IVPB 500 mg  Status:  Discontinued        500 mg 100 mL/hr over 60 Minutes Intravenous  Once 05/18/22 1948 05/18/22 2144   05/18/22 2000  ceFEPIme (MAXIPIME) 2 g in sodium chloride 0.9 % 100 mL IVPB  Status:  Discontinued        2 g 200 mL/hr over 30 Minutes Intravenous  Once 05/18/22 1959 05/18/22 2209   05/18/22 2000  vancomycin (VANCOCIN) IVPB 1000 mg/200 mL premix  Status:  Discontinued        1,000 mg 200 mL/hr over 60 Minutes Intravenous  Once 05/18/22 1959 05/18/22 2209      Data Reviewed:  I  have personally reviewed the following.  CBC: Recent Labs  Lab 05/27/22 0506 05/27/22 1743 05/28/22 0822 05/29/22 0237 05/30/22 0621 05/31/22 0626  WBC 11.4*  --  11.7* 11.2* 12.1* 11.0*  HGB 6.9* 8.1* 7.9* 7.6* 8.6* 8.6*  HCT 21.7* 25.9* 25.5* 24.8* 27.4* 27.7*  MCV 88.9  --  90.1 90.2 89.8 91.1  PLT 302  --  342 367 392 710    Basic Metabolic Panel: Recent Labs  Lab 05/26/22 1146 05/27/22 0506 05/28/22 0822 05/29/22 0237 05/30/22 0621  NA 128* 131* 137 134* 136  K 4.2 4.2 4.1 3.9 3.9  CL 100 102 106 106 107  CO2 22 20* '24 25 25  '$ GLUCOSE 239* 272* 195* 211* 125*  BUN 38* 41* 31* 30* 25*  CREATININE 0.99 0.93 0.84 0.77 0.66  CALCIUM 8.3* 8.5* 8.7* 8.3* 8.6*    CBG: Recent Labs  Lab 05/31/22 0818 05/31/22 1128 05/31/22 1720 05/31/22 2113 06/01/22 0750  GLUCAP 169* 279* 233* 235* 135*    Most Recent Urinalysis On File:     Component Value Date/Time   COLORURINE YELLOW (A) 05/20/2022 0134   APPEARANCEUR CLEAR (A) 05/20/2022 0134   LABSPEC 1.013 05/20/2022 0134   PHURINE 6.0 05/20/2022 0134   GLUCOSEU 50 (A) 05/20/2022 0134   HGBUR NEGATIVE 05/20/2022 0134   BILIRUBINUR NEGATIVE 05/20/2022 0134   KETONESUR NEGATIVE 05/20/2022 0134   PROTEINUR 100 (A) 05/20/2022 0134   NITRITE NEGATIVE 05/20/2022 0134   LEUKOCYTESUR NEGATIVE 05/20/2022 0134     LOS: 3 days     Desma Maxim, MD [facility] 06/01/2022, 11:29 AM    If 7PM-7AM, please contact night coverage [email or web address]

## 2022-06-01 NOTE — Plan of Care (Signed)

## 2022-06-01 NOTE — Progress Notes (Signed)
Physical Therapy Treatment Patient Details Name: [name]. MRN: [id number] DOB: [birth year] Today's Date: 06/01/2022   History of Present Illness Pt is a 67 y/o M admitted on 05/18/22 with concern for right ankle osteomyelitis after an injury in December 2023. Pt was seen in consultation by podiatry, who recommended continuation of empiric IV antibiotics, underwent angiogram & angioplasty of the right anterior tibial artery with vascular surgery on 01/22. After consideration, opted for & underwent R BKA 01/25 given low likelihood of healing with non-surgical management. PMH: COPD, chronic respiratory failure on home O2, CAD s/p CABG, IDDM2, OSA on bipap at night, BPH HTN, hypothyroidism, paroxysmal a-fib on eliquis & amiodarone, PAD, L BKA    PT Comments    Pt was supine in bed upon arrival. He is lethargic but does become more alert throughout limited session. PT endorses lack of sleep previous night. Throughout the day today has been off /on sleeping and requested not to get OOB. He did fully participate in there ex to promote strengthening in BLEs. Also tolerated stretching of RLE/ PROM and AAROM. Reviewed importance of positioning to promote wound healing. Author called [place] clinic for update on LLE prosthetic however due to pt being in/out of hospital, they have only fitted him for stump shrinkers. Will need new order for prosthesis for clinic to resume care/build new prosthesis for old LLE BKA.  Encouraged pt to revisit clinic after DC from acute hospital. Acute PT will continue to follow per current POC.      Recommendations for follow up therapy are one component of a multi-disciplinary discharge planning process, led by the attending physician.  Recommendations may be updated based on patient status, additional functional criteria and insurance authorization.  Follow Up Recommendations  Follow physician's recommendations for discharge plan and follow up therapies (pt planning to  DC home tomorrow via EMS. Recommend HHPT if pt does DC home.)     Assistance Recommended at Discharge Frequent or constant Supervision/Assistance  Patient can return home with the following A lot of help with walking and/or transfers;A lot of help with bathing/dressing/bathroom;Assistance with cooking/housework;Direct supervision/assist for medications management;Direct supervision/assist for financial management;Assist for transportation;Help with stairs or ramp for entrance   Equipment Recommendations  None recommended by PT (pt refuses hospital bed and states he has all other equipment needs met)       Precautions / Restrictions Precautions Precautions: Fall Restrictions Weight Bearing Restrictions: Yes RLE Weight Bearing: Non weight bearing Other Position/Activity Restrictions: S/p R BKA, hx L BKA     Mobility  Bed Mobility  General bed mobility comments: Pt requested to stay in bed this date due to lack of sleep previous night. he did fully participate in ther ex while in bed          Cognition Arousal/Alertness: Awake/alert Behavior During Therapy: WFL for tasks assessed/performed Overall Cognitive Status: Within Functional Limits for tasks assessed  General Comments: Pt is A an O x 4 but visibly more fatigued today versus previous few days        Exercises Amputee Exercises Quad Sets: AROM, 20 reps Gluteal Sets: AROM, 10 reps Hip Extension: AROM, 10 reps Hip ABduction/ADduction: AAROM, 10 reps    General Comments General comments (skin integrity, edema, etc.): pt also tolerated HS stretching and hip flexors stretching in supine      Pertinent Vitals/Pain Pain Assessment Pain Assessment: 0-10 Pain Score: 6  Pain Location: R limb Pain Descriptors / Indicators: Discomfort Pain Intervention(s): Limited activity within patient's  tolerance, Monitored during session, Premedicated before session, Repositioned     PT Goals (current goals can now be found in the  care plan section) Acute Rehab PT Goals Patient Stated Goal: get home safely Progress towards PT goals: Progressing toward goals    Frequency    7X/week      PT Plan Current plan remains appropriate       AM-PAC PT "6 Clicks" Mobility   Outcome Measure  Help needed turning from your back to your side while in a flat bed without using bedrails?: A Lot Help needed moving from lying on your back to sitting on the side of a flat bed without using bedrails?: A Lot Help needed moving to and from a bed to a chair (including a wheelchair)?: Total Help needed standing up from a chair using your arms (e.g., wheelchair or bedside chair)?: Total Help needed to walk in hospital room?: Total Help needed climbing 3-5 steps with a railing? : Total 6 Click Score: 8    End of Session   Activity Tolerance: Patient tolerated treatment well;Patient limited by fatigue Patient left: in bed;with call bell/phone within reach;with bed alarm set Nurse Communication: Mobility status PT Visit Diagnosis: Muscle weakness (generalized) (M62.81);Other abnormalities of gait and mobility (R26.89);Pain Pain - Right/Left: Right Pain - part of body: Leg     Time: 6384-6659 PT Time Calculation (min) (ACUTE ONLY): 16 min  Charges:  $Therapeutic Exercise: 8-22 mins                    Julaine Fusi PTA 06/01/22, 4:17 PM

## 2022-06-02 DIAGNOSIS — S81809A Unspecified open wound, unspecified lower leg, initial encounter: Secondary | ICD-10-CM | POA: Diagnosis not present

## 2022-06-02 LAB — GLUCOSE, CAPILLARY
Glucose-Capillary: 92 mg/dL (ref 70–99)
Glucose-Capillary: 211 mg/dL — ABNORMAL HIGH (ref 70–99)
Glucose-Capillary: 163 mg/dL — ABNORMAL HIGH (ref 70–99)
Glucose-Capillary: 126 mg/dL — ABNORMAL HIGH (ref 70–99)

## 2022-06-02 MED ORDER — INSULIN GLARGINE-YFGN 100 UNIT/ML ~~LOC~~ SOLN
28.0000 [IU] | Freq: Every day | SUBCUTANEOUS | Status: DC
  Administered 2022-06-03 – 2022-06-05 (×3): 28 [IU] via SUBCUTANEOUS
  Filled 2022-06-02 (×3): qty 0.28

## 2022-06-02 NOTE — TOC Progression Note (Addendum)
Transition of Care (TOC) - Progression Note    Patient Details  Name: [name]. MRN: [id number] Date of Birth: 1955-08-07  Transition of Care [facility]) CM/SW Contact  Izola Price, RN Phone Number: 06/02/2022, 10:54 AM  Clinical Narrative: 06/02/22:  3159 Discharge order is now placed. Spoke with spouse and patient and spouse will call to check if needs to restart appeal via [name] or they can pick up appeal started 06/01/22. Spouse calling for appeal has IM from previously at home (spouse) who is isolated with Covid. She also has number to call for appeal, she confirmed verbally on phone call with RN CM. Also has RN CM call back number. Updated provider and Unit RN. [name] RN CM    Note: Last Important Message regarding Medicare Rights was given on 05/31/22 at 247 pm within 48 hours of current discharge order being placed. Under Media review no other documents have been uploaded since 05/30/22 even on refreshing so a copy of the IM message is not found, but the signature page was found in notes if needed for appeal documents. [name] RN CM   2/3: No fax received from [place] as of 335 today. Prior appeal was noted to be canceled with no DC order in place on 06/01/22. [name] RN CM    Expected Discharge Plan: [place] Barriers to Discharge: Continued Medical Work up  Expected Discharge Plan and Services   Discharge Planning Services: CM Consult   Living arrangements for the past 2 months: [employer] Expected Discharge Date: 06/02/22                 DME Agency: [employer]       HH Arranged: RN [place] Agency: [facility] Date [place] Agency Contacted: 05/30/22 Time [place]: 1110 Representative spoke with at [place]: Bee ([place]) Interventions SDOH Screenings   Food Insecurity: No Food Insecurity (05/19/2022)  Housing: Low Risk  (05/19/2022)  Transportation Needs: No Transportation Needs  (05/19/2022)  Recent Concern: Transportation Needs - Unmet Transportation Needs (03/21/2022)  Utilities: Not At Risk (05/19/2022)  Depression (PHQ2-9): Low Risk  (11/21/2021)  Tobacco Use: Medium Risk (05/25/2022)    Readmission Risk Interventions    02/13/2022    4:57 PM 09/29/2021   11:12 AM  Readmission Risk Prevention Plan  Transportation Screening Complete Complete  PCP or Specialist Appt within 3-5 Days  Complete  HRI or [place]  Complete  Social Work Consult for [place] Planning/Counseling  [place]  Not Applicable  Medication Review Press photographer) Complete Complete  PCP or Specialist appointment within 3-5 days of discharge Complete   HRI or [place] Complete   SW Recovery Care/Counseling Consult Complete   [place] Not Applicable

## 2022-06-02 NOTE — Plan of Care (Signed)
  Problem: Education: Goal: Knowledge of General Education information will improve Description: Including pain rating scale, medication(s)/side effects and non-pharmacologic comfort measures Outcome: Progressing   Problem: Health Behavior/Discharge Planning: Goal: Ability to manage health-related needs will improve Outcome: Progressing   Problem: Clinical Measurements: Goal: Ability to maintain clinical measurements within normal limits will improve Outcome: Progressing Goal: Will remain free from infection Outcome: Progressing Goal: Diagnostic test results will improve Outcome: Progressing Goal: Cardiovascular complication will be avoided Outcome: Progressing   Problem: Activity: Goal: Risk for activity intolerance will decrease Outcome: Progressing   Problem: Nutrition: Goal: Adequate nutrition will be maintained Outcome: Progressing   Problem: Pain Managment: Goal: General experience of comfort will improve Outcome: Progressing   Problem: Elimination: Goal: Will not experience complications related to bowel motility Outcome: Progressing

## 2022-06-02 NOTE — Progress Notes (Signed)
PROGRESS NOTE    [name].   PIR:[id number] DOB: Feb 02, 1956  DOA: 05/18/2022 Date of Service: 06/02/22 PCP: [name], MD  Brief Narrative / Hospital Course:  This is a pleasant 67 year old gentleman with a history of COPD, chronic respiratory failure on home O2, CAD status post CABG, insulin-dependent type 2 diabetes, OSA on BiPAP at night, BPH hypertension, hypothyroidism and paroxysmal atrial fibrillation on Eliquis and amiodarone who has peripheral arterial disease status post left BKA and is admitted to the hospital with concern for right ankle osteomyelitis after an injury in December 2023.  He was seen in consultation by podiatry, who recommends continuation of empiric IV antibiotics, underwent angiogram and angioplasty of the right anterior tibial artery with vascular surgery on 01/22. After consideration, opted for R BKA 01/24 given low likelihood of healing with non-surgical management. Awaiting SNF placement. Stopped abx 01/26 Friday. Planning SNF rehab placement after the  weekend, however days are used up and cannot afford out of pocket, confirming DME and HH  - medically stable for dc 01/30 - however wife is ill with COVID and is in isolation until Friday. She provides him with a good portion of care and will be unable to do so while isolating and ill. Patient is SNF level care appropriate and has new amputation so would not be safe to dc home without continuous supervision and care. His dc will have to be delayed until safe home plan can be established. He has not been able to independently transfer out of bed post-op yet. He will continue to work with PT and we can continue to monitor for when patient will be safe to dc home. He reports no one able to meet him at home today for discharge but reports he can and will arrange that for tomorrow  Consultants:  Podiatry Vascular Surgery   Procedures: 05/21/22 RLE Angiogram with angioplasty  05/24/22 R below-knee  amputation   ASSESSMENT & PLAN:   Principal Problem:   Non-healing wound of lower extremity, initial encounter Active Problems:   Acute osteomyelitis of right foot (HCC)   PAD (peripheral artery disease) (HCC)   S/P BKA (below knee amputation), left (HCC)   AKI (acute kidney injury) ([place])   Hyperkalemia   Chronic anticoagulation   AF (paroxysmal atrial fibrillation) ([place])   Uncontrolled type 2 diabetes mellitus with hypoglycemia, with long-term current use of insulin (HCC)   OSA (obstructive sleep apnea)   Chronic respiratory failure with hypoxia (HCC)   COPD (chronic obstructive pulmonary disease) (HCC)   Chronic diastolic CHF (congestive heart failure) ([place])   CAD with Hx of CABG   Essential hypertension   BPH (benign prostatic hyperplasia)  Acute osteomyelitis of right foot and ankle ([place]) Nonhealing wound right ankle PAD s/p right BKA 1/25 S/p angiogram with angioplasty 1/22 with vascular surgery Wound appears to be healing well completed cefepime and vancomycin 05/25/22  Plan home w/ HH - SNF days used up and pt/family cannot afford rehab care Vascular team ok for Eliquis, d/c clopidogrel   Continue PT/OT Analgesia PRN Vascular surgery f/u 2 weeks Home health orders placed  Patient is and has been for some time stable for discharge. Discharge orders placed today, family is appealing  Anemia Chronic, worse now d/t likely myelosuppression/chronic illness + recent surgery 1 unit PRBC this admission, repeat HH improved  Monitor CBC Caution given fall risk and AC/ASA  Uncontrolled type 2 diabetes mellitus with hypoglycemia, with long-term current use of insulin (HCC) Blood sugar  in the 300s on admission initially Continue home basal insulin - basal and bolus increased 2/2 Sliding scale coverage    COPD OSA on home BiPAP qhs Chronic respiratory failure on home O2 Patient is at baseline Continue home Pulmicort and inhalers Continue BiPAP and O2 at night and anytime  he is sleeping Encourage CPAP use QHS   PAF on Chronic anticoagulation CAD with Hx of CABG Chronic Essential hypertension  HFpEF- EF 55-60% Continue aspirin + Eliquis based on cardiology recs Continue ezetimibe, Imdur, amiodarone Continue losartan   BPH (benign prostatic hyperplasia) Continue finasteride and tamsulosin  Hyperkalemia - resolved Monitor BMP   AKI - resolved Monitor BMP   Pseudohyponatremia d/t hyperglycemia Monitor BMP  DVT prophylaxis: eliquis Central lines / invasive devices: none   Code Status: FULL CODE   Current Admission Status: inpatient   TOC needs / Dispo plan: home with home health Barriers to discharge / significant pending items: family appealing discharge (PT advises snf but would need to pay out of pocket and patient can't/won't. Stable for discharge and discharge order placed, family is appealing  Subjective / Brief ROS:  Pt reports physically feeling well today. Denies complaints.   Family Communication: none @ bedside   Objective Findings:  Vitals:   06/01/22 1545 06/01/22 2014 06/01/22 2325 06/02/22 0730  BP: 107/62  118/66 125/72  Pulse: 86  84 84  Resp: '18  18 17  '$ Temp: 98.2 F (36.8 C)  98.2 F (36.8 C) 97.9 F (36.6 C)  TempSrc:      SpO2: 97% 96% 95% 96%  Weight:      Height:        Intake/Output Summary (Last 24 hours) at 06/02/2022 1057 Last data filed at 06/02/2022 0700 Gross per 24 hour  Intake --  Output 3050 ml  Net -3050 ml    Filed Weights   05/18/22 2150  Weight: 135.5 kg    Examination:  Physical Exam Constitutional:      Appearance: Normal appearance. He is obese. He is not ill-appearing.  Cardiovascular:     Rate and Rhythm: Normal rate and regular rhythm.  Pulmonary:     Effort: Pulmonary effort is normal.     Breath sounds: Normal breath sounds.  Abdominal:     Palpations: Abdomen is soft.  Musculoskeletal:     Right lower leg: Tenderness present.     Left lower leg: Normal. No tenderness.      Comments: LLE BKA, RLE BKA     Right Lower Extremity: Right leg is amputated below knee. (surgical incision healing well. stables in place. no signs of infection. no dignificant amount of bleeding)    Left Lower Extremity: Left leg is amputated below knee.  Skin:    General: Skin is dry.  Neurological:     General: No focal deficit present.     Mental Status: He is alert and oriented to person, place, and time.  Psychiatric:        Behavior: Behavior normal.        Thought Content: Thought content normal.     Scheduled Medications:   amiodarone  200 mg Oral Daily   apixaban  5 mg Oral BID   ascorbic acid  500 mg Oral BID   aspirin EC  81 mg Oral Daily   budesonide  0.5 mg Nebulization BID   cyanocobalamin  5,000 mcg Oral Daily   ezetimibe  10 mg Oral Daily   ferrous sulfate  325 mg Oral Q breakfast  finasteride  5 mg Oral Daily   gabapentin  300 mg Oral QHS   insulin aspart  0-20 Units Subcutaneous TID WC   insulin aspart  14 Units Subcutaneous TID WC   insulin glargine-yfgn  30 Units Subcutaneous Daily   isosorbide mononitrate  60 mg Oral Daily   levothyroxine  25 mcg Oral Q0600   losartan  25 mg Oral Daily   multivitamin with minerals  1 tablet Oral Daily   nutrition supplement (JUVEN)  1 packet Oral BID BM   pramipexole  2 mg Oral QHS   primidone  250 mg Oral BID   Ensure Max Protein  11 oz Oral QHS   risperiDONE  0.5 mg Oral BID   tamsulosin  0.4 mg Oral Daily   PRN Medications:  acetaminophen **OR** acetaminophen, albuterol, HYDROcodone-acetaminophen, ondansetron **OR** ondansetron (ZOFRAN) IV, traZODone  Antimicrobials from admission:  Anti-infectives (From admission, onward)    Start     Dose/Rate Route Frequency Ordered Stop   05/24/22 1000  vancomycin (VANCOREADY) IVPB 1500 mg/300 mL        1,500 mg 150 mL/hr over 120 Minutes Intravenous 120 min pre-op 05/23/22 1830 05/25/22 1459   05/21/22 1655  ciprofloxacin (CIPRO) IVPB  Status:  Discontinued         over 60 Minutes  Continuous PRN 05/21/22 1655 05/21/22 1737   05/21/22 1515  ciprofloxacin (CIPRO) IVPB 400 mg  Status:  Discontinued        400 mg 200 mL/hr over 60 Minutes Intravenous 60 min pre-op 05/21/22 1515 05/21/22 1736   05/19/22 1200  vancomycin (VANCOREADY) IVPB 1250 mg/250 mL  Status:  Discontinued        1,250 mg 166.7 mL/hr over 90 Minutes Intravenous Every 12 hours 05/19/22 0739 05/25/22 1511   05/19/22 0100  vancomycin (VANCOREADY) IVPB 500 mg/100 mL       See Hyperspace for full Linked Orders Report.   500 mg 100 mL/hr over 60 Minutes Intravenous  Once 05/18/22 2209 05/19/22 0241   05/18/22 2300  vancomycin (VANCOREADY) IVPB 2000 mg/400 mL       See Hyperspace for full Linked Orders Report.   2,000 mg 200 mL/hr over 120 Minutes Intravenous  Once 05/18/22 2209 05/19/22 0138   05/18/22 2300  ceFEPIme (MAXIPIME) 2 g in sodium chloride 0.9 % 100 mL IVPB  Status:  Discontinued        2 g 200 mL/hr over 30 Minutes Intravenous Every 8 hours 05/18/22 2209 05/25/22 1511   05/18/22 2210  vancomycin variable dose per unstable renal function (pharmacist dosing)  Status:  Discontinued         Does not apply See admin instructions 05/18/22 2211 05/20/22 0908   05/18/22 2145  metroNIDAZOLE (FLAGYL) tablet 500 mg        500 mg Oral Every 12 hours 05/18/22 2116 05/25/22 2144   05/18/22 2000  metroNIDAZOLE (FLAGYL) IVPB 500 mg  Status:  Discontinued        500 mg 100 mL/hr over 60 Minutes Intravenous  Once 05/18/22 1948 05/18/22 2144   05/18/22 2000  ceFEPIme (MAXIPIME) 2 g in sodium chloride 0.9 % 100 mL IVPB  Status:  Discontinued        2 g 200 mL/hr over 30 Minutes Intravenous  Once 05/18/22 1959 05/18/22 2209   05/18/22 2000  vancomycin (VANCOCIN) IVPB 1000 mg/200 mL premix  Status:  Discontinued        1,000 mg 200 mL/hr over 60 Minutes Intravenous  Once 05/18/22 1959 05/18/22 2209      Data Reviewed:  I have personally reviewed the following.  CBC: Recent Labs  Lab  05/27/22 0506 05/27/22 1743 05/28/22 0822 05/29/22 0237 05/30/22 0621 05/31/22 0626  WBC 11.4*  --  11.7* 11.2* 12.1* 11.0*  HGB 6.9* 8.1* 7.9* 7.6* 8.6* 8.6*  HCT 21.7* 25.9* 25.5* 24.8* 27.4* 27.7*  MCV 88.9  --  90.1 90.2 89.8 91.1  PLT 302  --  342 367 392 657    Basic Metabolic Panel: Recent Labs  Lab 05/26/22 1146 05/27/22 0506 05/28/22 0822 05/29/22 0237 05/30/22 0621  NA 128* 131* 137 134* 136  K 4.2 4.2 4.1 3.9 3.9  CL 100 102 106 106 107  CO2 22 20* '24 25 25  '$ GLUCOSE 239* 272* 195* 211* 125*  BUN 38* 41* 31* 30* 25*  CREATININE 0.99 0.93 0.84 0.77 0.66  CALCIUM 8.3* 8.5* 8.7* 8.3* 8.6*    CBG: Recent Labs  Lab 06/01/22 0750 06/01/22 1130 06/01/22 1605 06/01/22 2110 06/02/22 0734  GLUCAP 135* 242* 180* 157* 92    Most Recent Urinalysis On File:     Component Value Date/Time   COLORURINE YELLOW (A) 05/20/2022 0134   APPEARANCEUR CLEAR (A) 05/20/2022 0134   LABSPEC 1.013 05/20/2022 0134   PHURINE 6.0 05/20/2022 0134   GLUCOSEU 50 (A) 05/20/2022 0134   HGBUR NEGATIVE 05/20/2022 0134   BILIRUBINUR NEGATIVE 05/20/2022 0134   KETONESUR NEGATIVE 05/20/2022 0134   PROTEINUR 100 (A) 05/20/2022 0134   NITRITE NEGATIVE 05/20/2022 0134   LEUKOCYTESUR NEGATIVE 05/20/2022 0134     LOS: 41 days     Desma Maxim, MD [facility] 06/02/2022, 10:57 AM    If 7PM-7AM, please contact night coverage [email or web address]

## 2022-06-02 NOTE — TOC Progression Note (Signed)
Transition of Care (TOC) - Progression Note    Patient Details  Name: [name]. MRN: [id number] Date of Birth: [birth year]  Transition of Care [facility]) CM/SW Contact  Izola Price, RN Phone Number: 06/02/2022, 4:07 PM  Clinical Narrative: 2/3: Einar Grad as status still showing pending requested documents. Was told to submit form indicating no discharge order on 06/01/22 but a new discharge order on 06/02/22 at 1030 am. Faxed to [facility]. [name] RN CM     Spoke to on call supervisor to ensure proper procedure in progress given phone call to [facility] and case remaining pending despite no discharge order on date of appeal on 2/2 and that this would normally prompt automatic cancellation of appeal.   As CM was faxing this information along with today's progress notes and RN CM notes documenting such, a fax came through from [place], after conversation with them, that upheld the patient's appeal started 06/01/22 where there was no discharge order in place.   Attempted to contact [facility] again to clarify this situation of no discharge order on 2/2, but new discharge order on 06/02/22 at 1030 am and did that not trigger need for new appeal by patient as this CM understanding and conferring with on call supervisor.   Left detailed VM, as [facility] now closed, with RN CM call back number. RN CM called spouse back to make sure she was aware that from what this CM was told at [facility], no new appeal was recorded so she did not miss any deadlines. Spouse became very upset as she stated she had been told when she called today that the prior appeal decision would stand. Suggested she call to clarify so as to not miss any timely deadlines. She was still upset. Updated on call supervisor again of situation.   Will contact [facility] again in am to attempt to clarify what is needed for new discharge order of today.  [name] RN CM  UPDATE: 450 pm: [facility] returned the last call RN CM made to them call and verified  they had received faxed documents and details left in the voice mail left. Representative confirmed that patient/spouse would have to initiate a new appeal for today's discharge order. Left VM for spouse and contacted patient directly to give the message that they have until noon on 06/03/22 to initiate a new appeal of current discharge if desired. He repeated information and said he would pass on to spouse. [name] RN CM 520 pm. [phone]  829 PM: Spouse returned call and we reviewed the situation and the clarification RN CM received and that she had also called to start a new appeal based on new discharge order. Provider updated. [name] RN CM   Expected Discharge Plan and Services   Discharge Planning Services: CM Consult   Living arrangements for the past 2 months: Single Family Home Expected Discharge Date: 06/02/22                 DME Agency: [employer]       HH Arranged: RN [place] Agency: [facility] Date [place] Agency Contacted: 05/30/22 Time [place]: 1110 Representative spoke with at [place]: [place] Determinants of Health ([place]) Interventions SDOH Screenings   Food Insecurity: No Food Insecurity (05/19/2022)  Housing: Low Risk  (05/19/2022)  Transportation Needs: No Transportation Needs (05/19/2022)  Recent Concern: Transportation Needs - Unmet Transportation Needs (03/21/2022)  Utilities: Not At Risk (05/19/2022)  Depression (PHQ2-9): Low Risk  (  11/21/2021)  Tobacco Use: Medium Risk (05/25/2022)    Readmission Risk Interventions    02/13/2022    4:57 PM 09/29/2021   11:12 AM  Readmission Risk Prevention Plan  Transportation Screening Complete Complete  PCP or Specialist Appt within 3-5 Days  Complete  HRI or [place]  Complete  Social Work Consult for [place] Planning/Counseling  Complete  Palliative Care Screening  Not Applicable  Medication Review Press photographer) Complete Complete  PCP or Specialist appointment within  3-5 days of discharge Complete   HRI or [place] Complete   SW Recovery Care/Counseling Consult Complete   [place] Not Applicable

## 2022-06-02 NOTE — TOC Progression Note (Signed)
Transition of Care (TOC) - Progression Note    Patient Details  Name: [name]. MRN: [id number] Date of Birth: 04/27/1956  Transition of Care [facility]) CM/SW Contact  Izola Price, RN Phone Number: 06/02/2022, 8:57 AM  Clinical Narrative:   2/3: 47  [facility] notification on fax this am, but it noted that no discharge order is in place for this patient. No CM notes indicate an appeal was placed pending a current discharge. TOC will monitor. [name] RN CM     Expected Discharge Plan: [place] Barriers to Discharge: Continued Medical Work up  Expected Discharge Plan and Services   Discharge Planning Services: CM Consult   Living arrangements for the past 2 months: [place]                   DME Agency: [employer]       HH Arranged: RN [place] Agency: [facility] Date [place]: 05/30/22 Time [place]: 1110 Representative spoke with at [place]: [place] ([place]) Interventions SDOH Screenings   Food Insecurity: No Food Insecurity (05/19/2022)  Housing: Low Risk  (05/19/2022)  Transportation Needs: No Transportation Needs (05/19/2022)  Recent Concern: Transportation Needs - Unmet Transportation Needs (03/21/2022)  Utilities: Not At Risk (05/19/2022)  Depression (PHQ2-9): Low Risk  (11/21/2021)  Tobacco Use: Medium Risk (05/25/2022)    Readmission Risk Interventions    02/13/2022    4:57 PM 09/29/2021   11:12 AM  Readmission Risk Prevention Plan  Transportation Screening Complete Complete  PCP or Specialist Appt within 3-5 Days  Complete  HRI or [place]  Complete  Social Work Consult for [place] Planning/Counseling  [place]  Not Applicable  Medication Review Press photographer) Complete Complete  PCP or Specialist appointment within 3-5 days of discharge Complete   HRI or [place] Complete   SW Recovery Care/Counseling Consult  Complete   [place] Not Applicable

## 2022-06-02 NOTE — TOC Progression Note (Incomplete Revision)
Transition of Care (TOC) - Progression Note    Patient Details  Name: [name]. MRN: [id number] Date of Birth: 19-Jan-1956  Transition of Care [facility]) CM/SW Contact  Izola Price, RN Phone Number: 06/02/2022, 4:07 PM  Clinical Narrative: 2/3: Einar Grad as status still showing pending requested documents. Was told to submit form indicating no discharge order on 06/01/22 but a new discharge order on 06/02/22 at 1030 am. Faxed to [facility]. [name] RN CM     Spoke to on call supervisor to ensure proper procedure in progress given phone call to [facility] and case remaining pending despite no discharge order on date of appeal on 2/2 and that this would normally prompt automatic cancellation of appeal.   As CM was faxing this information along with today's progress notes and RN CM notes documenting such, a fax came through from [place], after conversation with them, that upheld the patient's appeal started 06/01/22 where there was no discharge order in place.   Attempted to contact [facility] again to clarify this situation of no discharge order on 2/2, but new discharge order on 06/02/22 at 1030 am and did that not trigger need for new appeal by patient as this CM understanding and conferring with on call supervisor.   Left detailed VM, as [facility] now closed, with RN CM call back number. RN CM called spouse back to make sure she was aware that from what this CM was told at [facility], no new appeal was recorded so she did not miss any deadlines. Spouse became very upset as she stated she had been told when she called today that the prior appeal decision would stand. Suggested she call to clarify so as to not miss any timely deadlines. She was still upset. Updated on call supervisor again of situation.   Will contact [facility] again in am to attempt to clarify what is needed for new discharge order of today.  [name] RN CM  UPDATE: 450 pm: [facility] returned the last call RN CM made to them call and verified  they had received faxed documents and details left in the voice mail left. Representative confirmed that patient/spouse would have to initiate a new appeal for today's discharge order. Left VM for spouse and contacted patient directly to give the message that they have until noon on 06/03/22 to initiate a new appeal of current discharge if desired. He repeated information and said he would pass on to spouse. [name] RN CM 520 pm. [phone]  563 PM: Spouse returned call and we reviewed the situation and the clarification RN CM received and that she had also called to start a new appeal based on new discharge order. Provider updated. [name] RN CM   Expected Discharge Plan and Services   Discharge Planning Services: CM Consult   Living arrangements for the past 2 months: Single Family Home Expected Discharge Date: 06/02/22                 DME Agency: [facility]       HH Arranged: RN [place] [facility]: [facility] Date [place] Agency Contacted: 05/30/22 Time [place]: 1110 Representative spoke with at [place]: [place] Determinants of Health ([place]) Interventions SDOH Screenings   Food Insecurity: No Food Insecurity (05/19/2022)  Housing: Low Risk  (05/19/2022)  Transportation Needs: No Transportation Needs (05/19/2022)  Recent Concern: Transportation Needs - Unmet Transportation Needs (03/21/2022)  Utilities: Not At Risk (05/19/2022)  Depression (PHQ2-9): Low Risk  (  11/21/2021)  Tobacco Use: Medium Risk (05/25/2022)    Readmission Risk Interventions    02/13/2022    4:57 PM 09/29/2021   11:12 AM  Readmission Risk Prevention Plan  Transportation Screening Complete Complete  PCP or Specialist Appt within 3-5 Days  Complete  HRI or [place]  Complete  Social Work Consult for [place] Planning/Counseling  Complete  Palliative Care Screening  Not Applicable  Medication Review Press photographer) Complete Complete  PCP or Specialist appointment within  3-5 days of discharge Complete   HRI or [place] Complete   SW Recovery Care/Counseling Consult Complete   [place] Not Applicable

## 2022-06-02 NOTE — Progress Notes (Signed)
Physical Therapy Treatment Patient Details Name: Abby Stines. MRN: [id number] DOB: 1955-07-04 Today's Date: 06/02/2022   History of Present Illness Pt is a 67 y/o M admitted on 05/18/22 with concern for right ankle osteomyelitis after an injury in December 2023. Pt was seen in consultation by podiatry, who recommended continuation of empiric IV antibiotics, underwent angiogram & angioplasty of the right anterior tibial artery with vascular surgery on 01/22. After consideration, opted for & underwent R BKA 01/25 given low likelihood of healing with non-surgical management. PMH: COPD, chronic respiratory failure on home O2, CAD s/p CABG, IDDM2, OSA on bipap at night, BPH HTN, hypothyroidism, paroxysmal a-fib on eliquis & amiodarone, PAD, L BKA    PT Comments    Pt was supine in bed upon arrival. He was talking to wife on phone discussing DC appeal. He is A and O x 4 and agrees to session. Discussed pt needing/using hospital bed at home. Pt is agreeable but wanted to confirm with his spouse prior. Pt eager to get OOB to recliner. Author discussed anterior posterior transfer versus slideboard transfer. Pt only wanting to perform lateral sideboard transfer. Max assist to safely achieve EOB sitting. Sat EOB x several minutes perform there ex prior to attempting slideboard transfer to recliner. Pt able to get ~ 1/2 way on slideboard between chair and recliner and started to slide forward. For safety to prevent falling, pt required total assistance to safely return to supine in bed. Pt fatigued quickly with all exercises and activity. Too fatigued to attempt transfer to recliner again without use of hoyer lift. Oceanographer with transfer via lift. He wa repositioning recliner and requested to sit in chair until 2pm. Highly recommend use of mechanical lift for transfers until pt can perform transfers more safely.   Recommendations for follow up therapy are one component of a multi-disciplinary  discharge planning process, led by the attending physician.  Recommendations may be updated based on patient status, additional functional criteria and insurance authorization.  Follow Up Recommendations  Other (comment) (Pt is out of rehab days and unable to pay privately for rehab. Currently appealing DC home however is planning to DC directly home with [facility] services + spouse assistance. discussed benefit of hospital bed.)     Assistance Recommended at Discharge Frequent or constant Supervision/Assistance  Patient can return home with the following Two people to help with walking and/or transfers   [facility] bed (if pt remains agreeable)       Precautions / Restrictions Precautions Precautions: Fall Restrictions Weight Bearing Restrictions: Yes RLE Weight Bearing: Non weight bearing LLE Weight Bearing: Weight bearing as tolerated     Mobility  Bed Mobility Overal bed mobility: Needs Assistance Bed Mobility: Rolling Rolling: Min assist (increased time) Supine to sit: Max assist   General bed mobility comments: Max assist required to safely achieve EOB sitting with heavy use of bed rail and bed functions. Takes time to achieve EOB balance with BUE support. eventually able to sit EOB with close SBA    Transfers Overall transfer level: Needs assistance Equipment used: Sliding board Transfers: Bed to chair/wheelchair/BSC     Anterior-Posterior transfers:  (pt unwilling to attemp anterior/posteruior transfer. elected to attempt slideboard however unsafe ths date due to fatigue and weakness)  Lateral/Scoot Transfers: From elevated surface, Max assist, Total assist General transfer comment: Pt agreed to OOB to recliner and requested to perform slideboard transfers. Discussed option of anterior-posterior transfer but pt elected to try  lateral transfer via slideboard. Pt puts forth great effort however after making it halfway from EOB to recliner, begins sliding  forwards and requird total assist to return to supine. Pt then too fatigued to attempt again and used hoyer lift to get OOB to recliner. RN tech assisted with Merchandiser, retail. pt has hoyer lift at home and endorses wife used it to get him out/in bed at baseline. highly recommend continued use of mechanical lift until pt's balance, strength, and transfer safety improves. Transfer via Geophysicist/field seismologist:  Product manager)  Ambulation/Gait    General Gait Details: unable   Balance Overall balance assessment: Needs assistance Sitting-balance support: Bilateral upper extremity supported Sitting balance-Leahy Scale: Poor Sitting balance - Comments: reliant on BUE support at EOB to prevent LOB    Standing balance comment: unable       Cognition Arousal/Alertness: Awake/alert Behavior During Therapy: WFL for tasks assessed/performed Overall Cognitive Status: Within Functional Limits for tasks assessed    General Comments: Pt is A and O x 4. Wants to trial slideboard transfers to recliner           General Comments General comments (skin integrity, edema, etc.): Author wrapped LLE stump due to pt's spouse unable to come to hospital with shrikers. pt 's RLE was wrapped prior to session and continues to have small drainage from R/lateral incision site. pt endorses performing Amputee exercises independently. Uncouraged also performing core strengthening and tricep strengthening. reviewed importnace of positioning for wound management/healing      Pertinent Vitals/Pain Pain Assessment Pain Assessment: 0-10 Pain Score: 8  Pain Location: R limb Pain Descriptors / Indicators: Discomfort Pain Intervention(s): Limited activity within patient's tolerance, Monitored during session, Premedicated before session, Repositioned     PT Goals (current goals can now be found in the care plan section) Acute Rehab PT Goals Patient Stated Goal: "Go home when my wife is weel enough to take care of me." Progress  towards PT goals: Progressing toward goals    Frequency    7X/week      PT Plan Current plan remains appropriate       AM-PAC PT "6 Clicks" Mobility   Outcome Measure  Help needed turning from your back to your side while in a flat bed without using bedrails?: A Lot Help needed moving from lying on your back to sitting on the side of a flat bed without using bedrails?: A Lot Help needed moving to and from a bed to a chair (including a wheelchair)?: Total Help needed standing up from a chair using your arms (e.g., wheelchair or bedside chair)?: Total Help needed to walk in hospital room?: Total Help needed climbing 3-5 steps with a railing? : Total 6 Click Score: 8    End of Session Equipment Utilized During Treatment: Gait belt Activity Tolerance: Patient limited by fatigue Patient left: in chair;with chair alarm set;with call bell/phone within reach Nurse Communication: Mobility status PT Visit Diagnosis: Muscle weakness (generalized) (M62.81);Other abnormalities of gait and mobility (R26.89);Pain Pain - Right/Left: Right Pain - part of body: Leg     Time: 7893-8101 PT Time Calculation (min) (ACUTE ONLY): 24 min  Charges:  $Therapeutic Exercise: 8-22 mins $Therapeutic Activity: 8-22 mins                     Julaine Fusi PTA 06/02/22, 1:05 PM

## 2022-06-03 DIAGNOSIS — S81809A Unspecified open wound, unspecified lower leg, initial encounter: Secondary | ICD-10-CM | POA: Diagnosis not present

## 2022-06-03 LAB — GLUCOSE, CAPILLARY
Glucose-Capillary: 119 mg/dL — ABNORMAL HIGH (ref 70–99)
Glucose-Capillary: 187 mg/dL — ABNORMAL HIGH (ref 70–99)
Glucose-Capillary: 301 mg/dL — ABNORMAL HIGH (ref 70–99)
Glucose-Capillary: 210 mg/dL — ABNORMAL HIGH (ref 70–99)

## 2022-06-03 IMAGING — MR MR FOOT*L* W/O CM
7 series · 40 of 40 positions shown · non-contrast
Comparison: Radiographs 01/03/2021. MRI 02/23/2019

CLINICAL DATA: Diabetic foot ulcer.

EXAM:
MRI OF THE LEFT FOOT WITHOUT CONTRAST
TECHNIQUE: Multiplanar, multisequence MR imaging of the left foot was
performed. No intravenous contrast was administered.

[Series 3: T1 · coronal · left · 3.0mm · 0.47mm/px · 7 of 60 slices shown (1 of 2)]
[im 1/60]
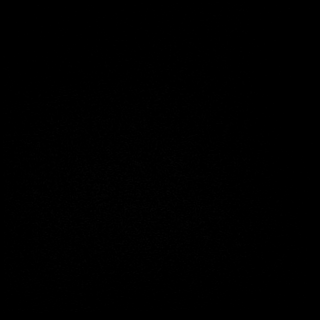
[im 10/60]
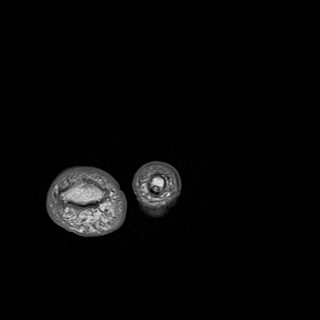
[im 20/60]
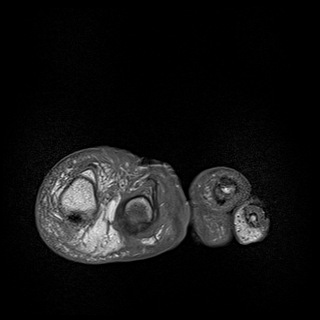
[im 30/60]
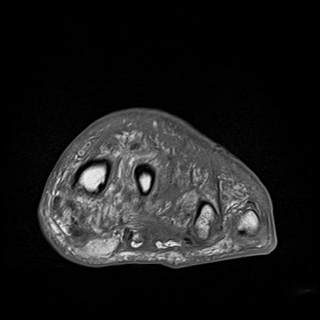
[im 40/60]
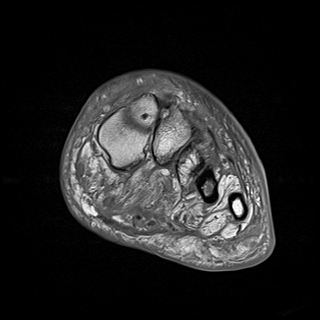
[im 50/60]
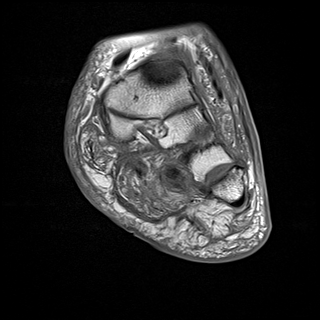
[im 60/60]
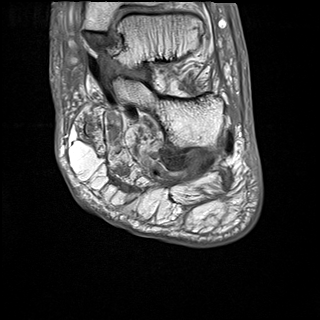

[Series 4: T2 · coronal · left · 3.0mm · 0.62mm/px · 8 of 60 slices shown (1 of 4)]
[im 1/60]
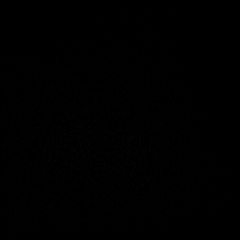
[im 9/60]
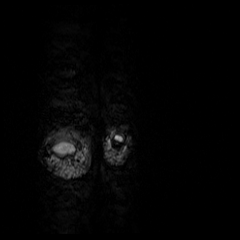
[im 17/60]
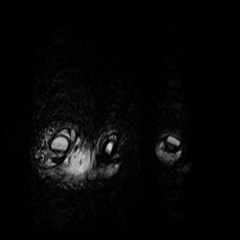
[im 26/60]
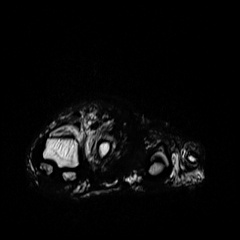
[im 34/60]
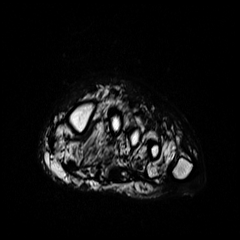
[im 43/60]
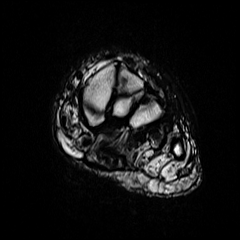
[im 51/60]
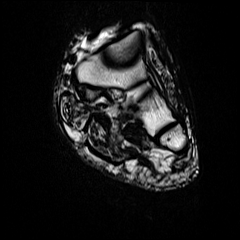
[im 60/60]
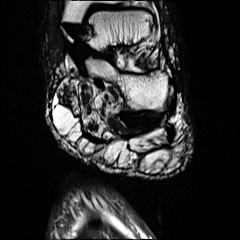

[Series 5: T2 · coronal · left · 3.0mm · 0.62mm/px · 8 of 59 slices shown (2 of 4)]
[im 1/59]
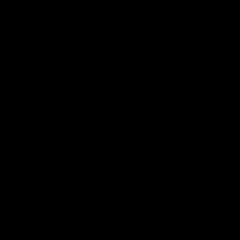
[im 9/59]
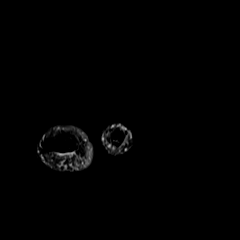
[im 17/59]
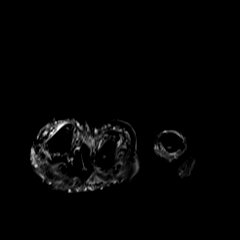
[im 25/59]
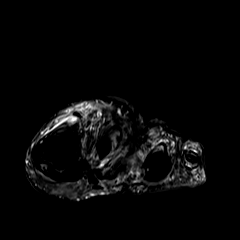
[im 34/59]
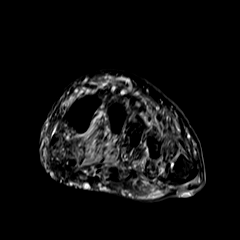
[im 42/59]
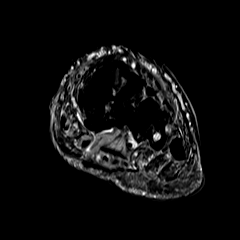
[im 50/59]
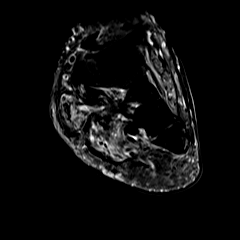
[im 59/59]
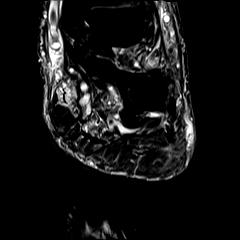

[Series 6: T1 · axial · left · 3.0mm · 0.78mm/px · z∈[-56,+54]mm · 4 of 30 slices shown (2 of 2)]
[im 1/30]
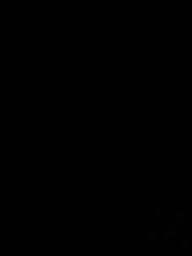
[im 10/30]
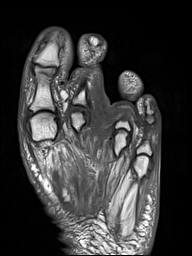
[im 20/30]
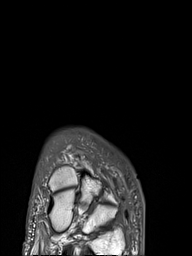
[im 30/30]
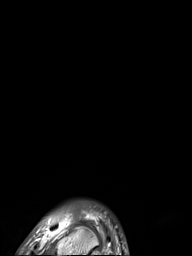

[Series 7: T2 · axial · left · 3.0mm · 0.78mm/px · z∈[-56,+54]mm · 4 of 30 slices shown (3 of 4)]
[im 1/30]
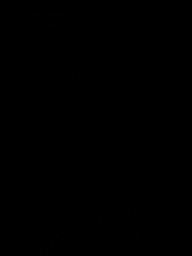
[im 10/30]
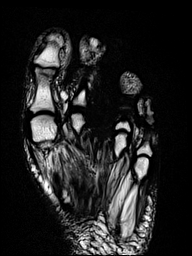
[im 20/30]
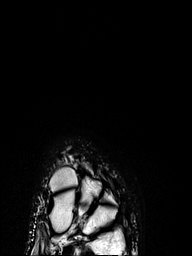
[im 30/30]
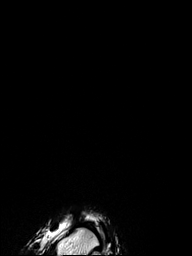

[Series 8: T2 · axial · left · 3.0mm · 0.78mm/px · z∈[-53,+54]mm · 4 of 29 slices shown (4 of 4)]
[im 1/29]
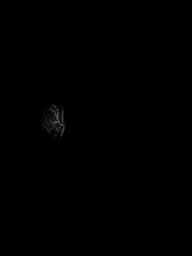
[im 10/29]
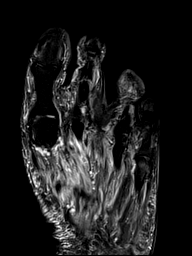
[im 19/29]
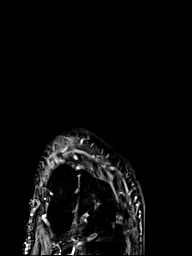
[im 29/29]
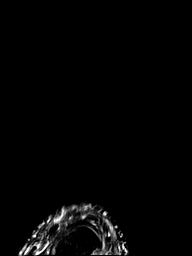

[Series 9: STIR · sagittal · left · 3.3mm · 0.62mm/px · 5 of 34 slices shown]
[im 1/34]
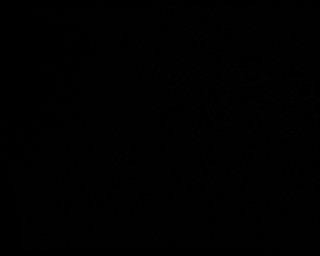
[im 9/34]
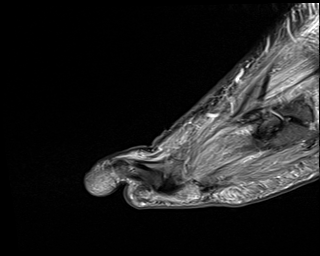
[im 17/34]
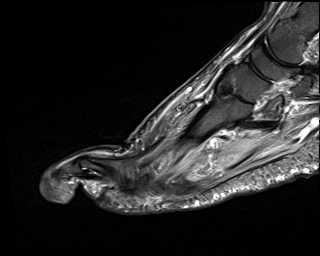
[im 25/34]
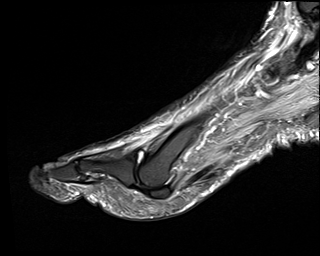
[im 34/34]
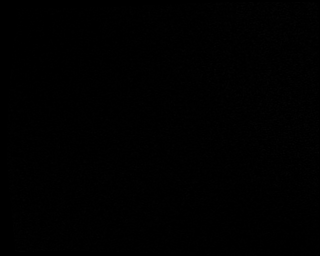

[40 of 40 positions shown; findings below may reference images not displayed]

FINDINGS: There is a ulceration noted along the plantar lateral aspect of the
forefoot near the level of the fifth metatarsal head. No underlying
subcutaneous abscess is identified. Diffuse subcutaneous soft tissue
swelling/edema/fluid suggesting cellulitis. No soft tissue abscesses
are identified for certain without contrast.

Diffuse myositis without findings for pyomyositis.

Surgical changes related to a prior amputation involving the third
digit and third distal metatarsal. No findings suspicious septic
arthritis or osteomyelitis. Chronic cystic type degenerative changes
are noted in the bases of the third, fourth and fifth metatarsals.

The major tendons and ligaments appear grossly intact.
IMPRESSION: 1. Soft tissue ulceration along the plantar lateral aspect of the
forefoot near the level of the fifth metatarsal head. No underlying
subcutaneous abscess.
2. Diffuse cellulitis and myositis.
3. No findings for septic arthritis or osteomyelitis.

## 2022-06-03 NOTE — TOC Progression Note (Signed)
Transition of Care (TOC) - Progression Note    Patient Details  Name: [name] MRN: [id number] Date of Birth: 1955-12-04  Transition of Care [facility]) CM/SW Contact  Izola Price, RN Phone Number: 06/03/2022, 10:16 AM  Clinical Narrative:  06/03/22: Jacquelynn Cree expedited appeal document request this am on fax machine delivered at 558 pm 06/02/22. Detailed Notice of Discharge (DND) and [id number] notice completed and explained to patient verbally. Permission for RN CM to e-sign receipt of notice after read and explained verbally, and a hard copy to be provided to patient directly to expedite request for documents before noon today, the deadline.   1013 am EST 06/03/22: Confirmation for requested discharge appeal documents uploaded to [place] at [email or web address] 06/04/22 1013 am for [employer] Appeal Case #[id number]    Confirmed IM, DND, and HINN12 present in EMR report uploaded to [facility].  [name] RN CM    Expected Discharge Plan: [place] Barriers to Discharge: Continued Medical Work up  Expected Discharge Plan and Services   Discharge Planning Services: CM Consult   Living arrangements for the past 2 months: [place] Expected Discharge Date: 06/02/22                 DME Agency: [employer]       HH Arranged: RN [place] Agency: [facility] Date [place] Agency Contacted: 05/30/22 Time [place]: 1110 Representative spoke with at [place]: [place] ([place]) Interventions SDOH Screenings   Food Insecurity: No Food Insecurity (05/19/2022)  Housing: Low Risk  (05/19/2022)  Transportation Needs: No Transportation Needs (05/19/2022)  Recent Concern: Transportation Needs - Unmet Transportation Needs (03/21/2022)  Utilities: Not At Risk (05/19/2022)  Depression (PHQ2-9): Low Risk  (11/21/2021)  Tobacco Use: Medium Risk (05/25/2022)    Readmission Risk  Interventions    02/13/2022    4:57 PM 09/29/2021   11:12 AM  Readmission Risk Prevention Plan  Transportation Screening Complete Complete  PCP or Specialist Appt within 3-5 Days  Complete  HRI or [place]  Complete  Social Work Consult for [place] Planning/Counseling  [place]  Not Applicable  Medication Review Press photographer) Complete Complete  PCP or Specialist appointment within 3-5 days of discharge Complete   HRI or [place] Complete   SW Recovery Care/Counseling Consult Complete   [place] Not Applicable

## 2022-06-03 NOTE — Plan of Care (Signed)

## 2022-06-03 NOTE — TOC Progression Note (Signed)
Transition of Care (TOC) - Progression Note    Patient Details  Name: [name]. MRN: [id number] Date of Birth: 11-11-1955  Transition of Care [facility]) CM/SW Contact  Izola Price, RN Phone Number: 06/03/2022, 10:26 AM  [name] Appeal Detailed Notice of Discharge letter created and saved: Yes Detailed Notice of Discharge Document Given to [place]: Yes [employer] ROI Document Created: Yes [employer] appeal documents uploaded to [employer] stite: Yes   [name] RN CM 1030 am 06/03/22.

## 2022-06-03 NOTE — TOC Progression Note (Signed)
Transition of Care (TOC) - Progression Note    Patient Details  Name: [name]. MRN: [id number] Date of Birth: 12/18/1955  Transition of Care [facility]) CM/SW Contact  Izola Price, RN Phone Number: 06/03/2022, 4:59 PM  Clinical Narrative:  2/4[name] appeal [id number] decision document was faxed to [place] at 5 PM. The document indicated the parties were notified at 4:34:03 pm, today, 06/03/22. RN CM called patient, who was on speaker phone with spouse to confirm they had received notification. Patient and spouse confirmed they had and spouse plans to appeal again at 8 am on Monday, 06/24/22. [place] letter copy provided to patient. Copy of DND notice placed on [place] desk. [name] RN CM     Expected Discharge Plan: [place] Barriers to Discharge: Continued Medical Work up  Expected Discharge Plan and Services   Discharge Planning Services: CM Consult   Living arrangements for the past 2 months: [place] Expected Discharge Date: 06/02/22                 DME Agency: [employer]       HH Arranged: RN [place] Agency: [facility] Date [place] Agency Contacted: 05/30/22 Time [place]: 1110 Representative spoke with at [place]: [place] ([place]) Interventions SDOH Screenings   Food Insecurity: No Food Insecurity (05/19/2022)  Housing: Low Risk  (05/19/2022)  Transportation Needs: No Transportation Needs (05/19/2022)  Recent Concern: Transportation Needs - Unmet Transportation Needs (03/21/2022)  Utilities: Not At Risk (05/19/2022)  Depression (PHQ2-9): Low Risk  (11/21/2021)  Tobacco Use: Medium Risk (05/25/2022)    Readmission Risk Interventions    02/13/2022    4:57 PM 09/29/2021   11:12 AM  Readmission Risk Prevention Plan  Transportation Screening Complete Complete  PCP or Specialist Appt within 3-5 Days  Complete  HRI or [place]  Complete  Social Work Consult for [place]  Planning/Counseling  [place]  Not Applicable  Medication Review Press photographer) Complete Complete  PCP or Specialist appointment within 3-5 days of discharge Complete   HRI or [place] Complete   SW Recovery Care/Counseling Consult Complete   [place] Not Applicable

## 2022-06-03 NOTE — Progress Notes (Addendum)
Physical Therapy Treatment Patient Details Name: [name]. MRN: [id number] DOB: [birth year] Today's Date: 06/03/2022   History of Present Illness Pt is a 67 y/o M admitted on 05/18/22 with concern for right ankle osteomyelitis after an injury in December 2023. Pt was seen in consultation by podiatry, who recommended continuation of empiric IV antibiotics, underwent angiogram & angioplasty of the right anterior tibial artery with vascular surgery on 01/22. After consideration, opted for & underwent R BKA 01/25 given low likelihood of healing with non-surgical management. PMH: COPD, chronic respiratory failure on home O2, CAD s/p CABG, IDDM2, OSA on bipap at night, BPH HTN, hypothyroidism, paroxysmal a-fib on eliquis & amiodarone, PAD, L BKA    PT Comments    Pt in bed,  c/o pain in bottom from laying too long but declined OOB stating he plans to discharge home today.  Did agree to sitting with bed mobility skills.  Pt is unable to sit upright while supine and needs HOB elevated with rails to assist in sitting upright.  Once sitting he is steady with rail support but is unable to scoot backwards in sitting and needs max a with pad to move.  He stated he does not have a hospital bed at home and has a thick foam mattress.  Anticipate pt will have much difficulty sitting upright in bed without extensive assist on mattress with no rails.  He is able to sit for 15 minutes during discussion of discharge plan and safety and needs HOB raised back up to assist in transition back to supine.  Lengthy discussion regarding safety.  Pt does plan to use sliding board to get in/out of wheelchair on his own.  Reviewed yesterdays session and prior difficulties with sliding board and post approach getting to chair and extensive assist.  Reviewed near fall while doing it yesterday with therapist :"that was his fault, not mine."  Stressed importance of using [place] at home and he stated he does not like it and that he  fell out of it last time he tried it on his own.  He stated he did not hook sling fully and it slipped causing him to fall out "On my kidney".  Rolls into sling, hooks it up, raises it and then used bedposts to pull on and move himself in lift.  Stressed need for assist and not to try it on his own and that a Hoyer sling requires assist of at least 1 other person to use.   Also plans to use sliding board in and out of car with wife.  Encouraged use of wheelchair transport services for appointments.  "I'lll just pull myself into it like I did before"  Also stated that God would look after him and give him wings if he started to fall and help him to the chair. Laughing,  "I'm not worried about that'."   Pt encouraged not to try any activities without assist and encouraged not to try transfers without lift/wife until PT/OT could be in the home to assist and modify prior technique to work in his home and new limitations.    Pt continues to have very unrealistic expectations and idea on how his mobility will look like at home vs previously which sounds precarious at best before new amputation.  Pt was educated and encouraged to use safer options for mobility and to always have at least +1 assist and use appropriate devices and services to increase safety.  Pt remains at increased risk of fall /  injury but has been educated on his choices. Pt would benefit from continued SNF stay but given insurance and financial difficulties he is opting/has no other choice than to return home with wife.     Recommendations for follow up therapy are one component of a multi-disciplinary discharge planning process, led by the attending physician.  Recommendations may be updated based on patient status, additional functional criteria and insurance authorization.  Follow Up Recommendations  Other (comment) (Pt is out of rehab days and unable to pay privately for rehab. Currently appealing DC home however is planning to DC directly  home with [facility] services + spouse assistance. discussed benefit of hospital bed.)     Assistance Recommended at Discharge Frequent or constant Supervision/Assistance  Patient can return home with the following Two people to help with walking and/or transfers   [place] [facility] bed (if pt remains agreeable)    Recommendations for Other Services       Precautions / Restrictions Precautions Precautions: Fall Restrictions Weight Bearing Restrictions: Yes RLE Weight Bearing: Non weight bearing LLE Weight Bearing: Non weight bearing Other Position/Activity Restrictions: S/p R BKA, hx L BKA     Mobility  Bed Mobility Overal bed mobility: Needs Assistance Bed Mobility: Supine to Sit Rolling: Min assist   Supine to sit: Mod assist, HOB elevated Sit to supine: Min assist, HOB elevated        Transfers                  Lateral/Scoot Transfers: Mod assist (with bed rails)      Ambulation/Gait                   Stairs             Wheelchair Mobility    Modified Rankin (Stroke Patients Only)       Balance Overall balance assessment: Needs assistance Sitting-balance support: Bilateral upper extremity supported Sitting balance-Leahy Scale: Poor Sitting balance - Comments: reliant on BUE support on rails to prevent LOB       Standing balance comment: unable                            Cognition Arousal/Alertness: Awake/alert Behavior During Therapy: WFL for tasks assessed/performed Overall Cognitive Status: Within Functional Limits for tasks assessed                                          Exercises Amputee Exercises Quad Sets: AROM, 20 reps Gluteal Sets: AROM, 10 reps Hip Extension: AROM, 10 reps Hip ABduction/ADduction: AAROM, 10 reps    General Comments        Pertinent Vitals/Pain Pain Assessment Pain Assessment: Faces Faces Pain Scale: Hurts even more Pain Location: R limb,  bottom Pain Descriptors / Indicators: Discomfort, Aching Pain Intervention(s): Limited activity within patient's tolerance, Monitored during session, Repositioned    Home Living                          Prior Function            PT Goals (current goals can now be found in the care plan section) Progress towards PT goals: Progressing toward goals    Frequency    7X/week      PT Plan Current plan remains appropriate  Co-evaluation              AM-PAC PT "6 Clicks" Mobility   Outcome Measure  Help needed turning from your back to your side while in a flat bed without using bedrails?: A Lot Help needed moving from lying on your back to sitting on the side of a flat bed without using bedrails?: Total Help needed moving to and from a bed to a chair (including a wheelchair)?: Total Help needed standing up from a chair using your arms (e.g., wheelchair or bedside chair)?: Total Help needed to walk in hospital room?: Total Help needed climbing 3-5 steps with a railing? : Total 6 Click Score: 7    End of Session   Activity Tolerance: Patient limited by fatigue Patient left: in chair;with chair alarm set;with call bell/phone within reach Nurse Communication: Mobility status PT Visit Diagnosis: Muscle weakness (generalized) (M62.81);Other abnormalities of gait and mobility (R26.89);Pain Pain - Right/Left: Right Pain - part of body: Leg     Time: 8309-4076 PT Time Calculation (min) (ACUTE ONLY): 24 min  Charges:  $Therapeutic Activity: 23-37 mins                    Chesley Noon, PTA 06/03/22, 9:39 AM

## 2022-06-03 NOTE — Progress Notes (Signed)
PROGRESS NOTE    [name].   KWI:[id number] DOB: April 12, 1956  DOA: 05/18/2022 Date of Service: 06/03/22 PCP: [name], MD  Brief Narrative / Hospital Course:  This is a pleasant 67 year old gentleman with a history of COPD, chronic respiratory failure on home O2, CAD status post CABG, insulin-dependent type 2 diabetes, OSA on BiPAP at night, BPH hypertension, hypothyroidism and paroxysmal atrial fibrillation on Eliquis and amiodarone who has peripheral arterial disease status post left BKA and is admitted to the hospital with concern for right ankle osteomyelitis after an injury in December 2023.  He was seen in consultation by podiatry, who recommends continuation of empiric IV antibiotics, underwent angiogram and angioplasty of the right anterior tibial artery with vascular surgery on 01/22. After consideration, opted for R BKA 01/24 given low likelihood of healing with non-surgical management. Awaiting SNF placement. Stopped abx 01/26 Friday. Planning SNF rehab placement after the  weekend, however days are used up and cannot afford out of pocket, confirming DME and HH  - medically stable for dc 01/30 - however wife is ill with COVID and is in isolation until Friday. She provides him with a good portion of care and will be unable to do so while isolating and ill. Patient is SNF level care appropriate and has new amputation so would not be safe to dc home without continuous supervision and care. His dc will have to be delayed until safe home plan can be established. He has not been able to independently transfer out of bed post-op yet. He will continue to work with PT and we can continue to monitor for when patient will be safe to dc home. He reports no one able to meet him at home today for discharge but reports he can and will arrange that for tomorrow  Consultants:  Podiatry Vascular Surgery   Procedures: 05/21/22 RLE Angiogram with angioplasty  05/24/22 R below-knee  amputation   ASSESSMENT & PLAN:   Principal Problem:   Non-healing wound of lower extremity, initial encounter Active Problems:   Acute osteomyelitis of right foot (HCC)   PAD (peripheral artery disease) (HCC)   S/P BKA (below knee amputation), left (HCC)   AKI (acute kidney injury) ([place])   Hyperkalemia   Chronic anticoagulation   AF (paroxysmal atrial fibrillation) ([place])   Uncontrolled type 2 diabetes mellitus with hypoglycemia, with long-term current use of insulin (HCC)   OSA (obstructive sleep apnea)   Chronic respiratory failure with hypoxia (HCC)   COPD (chronic obstructive pulmonary disease) (HCC)   Chronic diastolic CHF (congestive heart failure) ([place])   CAD with Hx of CABG   Essential hypertension   BPH (benign prostatic hyperplasia)  Acute osteomyelitis of right foot and ankle ([place]) Nonhealing wound right ankle PAD s/p right BKA 1/25 S/p angiogram with angioplasty 1/22 with vascular surgery Wound appears to be healing well completed cefepime and vancomycin 05/25/22  Plan home w/ [facility] - SNF days used up and pt/family cannot afford rehab care Vascular team ok for Eliquis, d/c clopidogrel   Continue PT/OT Analgesia PRN Vascular surgery f/u will need to touch base w/ them prior to discharge to determine time of f/u, if stays much longer they may elect to remove staples while inpatient Home health orders placed  Patient is and has been for some time stable for discharge. Discharge orders placed 2/3, family is appealing  Anemia Chronic, worse now d/t likely myelosuppression/chronic illness + recent surgery 1 unit [facility]BC this admission, repeat HH improved  Monitor CBC Caution given fall risk and AC/ASA  Uncontrolled type 2 diabetes mellitus with hypoglycemia, with long-term current use of insulin (HCC) Blood sugar in the 300s on admission initially, now better controlled - cont basal/bolus Sliding scale coverage    COPD OSA on home BiPAP qhs Chronic respiratory  failure on home O2 Patient is at baseline Continue home Pulmicort and inhalers Continue BiPAP and O2 at night and anytime he is sleeping Encourage CPAP use QHS   PAF on Chronic anticoagulation CAD with Hx of CABG Chronic Essential hypertension  HFpEF- EF 55-60% Continue aspirin + Eliquis based on cardiology recs Continue ezetimibe, Imdur, amiodarone Continue losartan   BPH (benign prostatic hyperplasia) Continue finasteride and tamsulosin  Hyperkalemia - resolved Monitor BMP   AKI - resolved Monitor BMP   Pseudohyponatremia d/t hyperglycemia Monitor BMP  DVT prophylaxis: eliquis Central lines / invasive devices: none   Code Status: FULL CODE   Current Admission Status: inpatient   TOC needs / Dispo plan: home with home health Barriers to discharge / significant pending items: family appealing discharge (PT advises snf but would need to pay out of pocket and patient can't/won't. Stable for discharge and discharge order placed, family is appealing  Subjective / Brief ROS:  Pt reports physically feeling well today. Denies complaints. BM earlier today  Family Communication: none @ bedside   Objective Findings:  Vitals:   06/02/22 1512 06/02/22 2048 06/03/22 0000 06/03/22 0741  BP: 105/63 109/60 122/64 125/69  Pulse: 94 84 78 74  Resp: '15 18 18 18  '$ Temp: 98.7 F (37.1 C) 98.1 F (36.7 C) 99.8 F (37.7 C) 97.7 F (36.5 C)  TempSrc:  Oral    SpO2: 93% 95% 94% 99%  Weight:      Height:        Intake/Output Summary (Last 24 hours) at 06/03/2022 1432 Last data filed at 06/03/2022 1330 Gross per 24 hour  Intake --  Output 4300 ml  Net -4300 ml    Filed Weights   05/18/22 2150  Weight: 135.5 kg    Examination:  Physical Exam Constitutional:      Appearance: Normal appearance. He is obese. He is not ill-appearing.  Cardiovascular:     Rate and Rhythm: Normal rate and regular rhythm.  Pulmonary:     Effort: Pulmonary effort is normal.     Breath sounds:  Normal breath sounds.  Abdominal:     Palpations: Abdomen is soft.  Musculoskeletal:     Right lower leg: Tenderness present.     Left lower leg: Normal. No tenderness.     Comments: LLE BKA, RLE BKA     Right Lower Extremity: Right leg is amputated below knee. (surgical incision healing well. stables in place. no signs of infection. no dignificant amount of bleeding)    Left Lower Extremity: Left leg is amputated below knee.  Skin:    General: Skin is dry.  Neurological:     General: No focal deficit present.     Mental Status: He is alert and oriented to person, place, and time.  Psychiatric:        Behavior: Behavior normal.        Thought Content: Thought content normal.     Scheduled Medications:   amiodarone  200 mg Oral Daily   apixaban  5 mg Oral BID   ascorbic acid  500 mg Oral BID   aspirin EC  81 mg Oral Daily   budesonide  0.5 mg Nebulization BID  cyanocobalamin  5,000 mcg Oral Daily   ezetimibe  10 mg Oral Daily   ferrous sulfate  325 mg Oral Q breakfast   finasteride  5 mg Oral Daily   gabapentin  300 mg Oral QHS   insulin aspart  0-20 Units Subcutaneous TID WC   insulin aspart  14 Units Subcutaneous TID WC   insulin glargine-yfgn  28 Units Subcutaneous Daily   isosorbide mononitrate  60 mg Oral Daily   levothyroxine  25 mcg Oral Q0600   losartan  25 mg Oral Daily   multivitamin with minerals  1 tablet Oral Daily   nutrition supplement (JUVEN)  1 packet Oral BID BM   pramipexole  2 mg Oral QHS   primidone  250 mg Oral BID   Ensure Max Protein  11 oz Oral QHS   risperiDONE  0.5 mg Oral BID   tamsulosin  0.4 mg Oral Daily   PRN Medications:  acetaminophen **OR** acetaminophen, albuterol, HYDROcodone-acetaminophen, ondansetron **OR** ondansetron (ZOFRAN) IV, traZODone  Antimicrobials from admission:  Anti-infectives (From admission, onward)    Start     Dose/Rate Route Frequency Ordered Stop   05/24/22 1000  vancomycin (VANCOREADY) IVPB 1500 mg/300 mL         1,500 mg 150 mL/hr over 120 Minutes Intravenous 120 min pre-op 05/23/22 1830 05/25/22 1459   05/21/22 1655  ciprofloxacin (CIPRO) IVPB  Status:  Discontinued        over 60 Minutes  Continuous PRN 05/21/22 1655 05/21/22 1737   05/21/22 1515  ciprofloxacin (CIPRO) IVPB 400 mg  Status:  Discontinued        400 mg 200 mL/hr over 60 Minutes Intravenous 60 min pre-op 05/21/22 1515 05/21/22 1736   05/19/22 1200  vancomycin (VANCOREADY) IVPB 1250 mg/250 mL  Status:  Discontinued        1,250 mg 166.7 mL/hr over 90 Minutes Intravenous Every 12 hours 05/19/22 0739 05/25/22 1511   05/19/22 0100  vancomycin (VANCOREADY) IVPB 500 mg/100 mL       See Hyperspace for full Linked Orders Report.   500 mg 100 mL/hr over 60 Minutes Intravenous  Once 05/18/22 2209 05/19/22 0241   05/18/22 2300  vancomycin (VANCOREADY) IVPB 2000 mg/400 mL       See Hyperspace for full Linked Orders Report.   2,000 mg 200 mL/hr over 120 Minutes Intravenous  Once 05/18/22 2209 05/19/22 0138   05/18/22 2300  ceFEPIme (MAXIPIME) 2 g in sodium chloride 0.9 % 100 mL IVPB  Status:  Discontinued        2 g 200 mL/hr over 30 Minutes Intravenous Every 8 hours 05/18/22 2209 05/25/22 1511   05/18/22 2210  vancomycin variable dose per unstable renal function (pharmacist dosing)  Status:  Discontinued         Does not apply See admin instructions 05/18/22 2211 05/20/22 0908   05/18/22 2145  metroNIDAZOLE (FLAGYL) tablet 500 mg        500 mg Oral Every 12 hours 05/18/22 2116 05/25/22 2144   05/18/22 2000  metroNIDAZOLE (FLAGYL) IVPB 500 mg  Status:  Discontinued        500 mg 100 mL/hr over 60 Minutes Intravenous  Once 05/18/22 1948 05/18/22 2144   05/18/22 2000  ceFEPIme (MAXIPIME) 2 g in sodium chloride 0.9 % 100 mL IVPB  Status:  Discontinued        2 g 200 mL/hr over 30 Minutes Intravenous  Once 05/18/22 1959 05/18/22 2209   05/18/22 2000  vancomycin (  VANCOCIN) IVPB 1000 mg/200 mL premix  Status:  Discontinued        1,000  mg 200 mL/hr over 60 Minutes Intravenous  Once 05/18/22 1959 05/18/22 2209      Data Reviewed:  I have personally reviewed the following.  CBC: Recent Labs  Lab 05/27/22 1743 05/28/22 0822 05/29/22 0237 05/30/22 0621 05/31/22 0626  WBC  --  11.7* 11.2* 12.1* 11.0*  HGB 8.1* 7.9* 7.6* 8.6* 8.6*  HCT 25.9* 25.5* 24.8* 27.4* 27.7*  MCV  --  90.1 90.2 89.8 91.1  PLT  --  342 367 392 762    Basic Metabolic Panel: Recent Labs  Lab 05/28/22 0822 05/29/22 0237 05/30/22 0621  NA 137 134* 136  K 4.1 3.9 3.9  CL 106 106 107  CO2 '24 25 25  '$ GLUCOSE 195* 211* 125*  BUN 31* 30* 25*  CREATININE 0.84 0.77 0.66  CALCIUM 8.7* 8.3* 8.6*    CBG: Recent Labs  Lab 06/02/22 1144 06/02/22 1638 06/02/22 2054 06/03/22 0738 06/03/22 1157  GLUCAP 211* 163* 126* 119* 187*    Most Recent Urinalysis On File:     Component Value Date/Time   COLORURINE YELLOW (A) 05/20/2022 0134   APPEARANCEUR CLEAR (A) 05/20/2022 0134   LABSPEC 1.013 05/20/2022 0134   PHURINE 6.0 05/20/2022 0134   GLUCOSEU 50 (A) 05/20/2022 0134   HGBUR NEGATIVE 05/20/2022 0134   BILIRUBINUR NEGATIVE 05/20/2022 0134   KETONESUR NEGATIVE 05/20/2022 0134   PROTEINUR 100 (A) 05/20/2022 0134   NITRITE NEGATIVE 05/20/2022 0134   LEUKOCYTESUR NEGATIVE 05/20/2022 0134     LOS: 4 days     Desma Maxim, MD [facility] 06/03/2022, 2:32 PM    If 7PM-7AM, please contact night coverage [email or web address]

## 2022-06-04 DIAGNOSIS — S81809A Unspecified open wound, unspecified lower leg, initial encounter: Secondary | ICD-10-CM | POA: Diagnosis not present

## 2022-06-04 LAB — CBC
HCT: 27.6 % — ABNORMAL LOW (ref 39.0–52.0)
Hemoglobin: 8.5 g/dL — ABNORMAL LOW (ref 13.0–17.0)
MCH: 28.5 pg (ref 26.0–34.0)
MCHC: 30.8 g/dL (ref 30.0–36.0)
MCV: 92.6 fL (ref 80.0–100.0)
Platelets: 357 10*3/uL (ref 150–400)
RBC: 2.98 MIL/uL — ABNORMAL LOW (ref 4.22–5.81)
RDW: 16.2 % — ABNORMAL HIGH (ref 11.5–15.5)
WBC: 11 10*3/uL — ABNORMAL HIGH (ref 4.0–10.5)
nRBC: 0 % (ref 0.0–0.2)

## 2022-06-04 LAB — GLUCOSE, CAPILLARY
Glucose-Capillary: 132 mg/dL — ABNORMAL HIGH (ref 70–99)
Glucose-Capillary: 248 mg/dL — ABNORMAL HIGH (ref 70–99)
Glucose-Capillary: 180 mg/dL — ABNORMAL HIGH (ref 70–99)
Glucose-Capillary: 196 mg/dL — ABNORMAL HIGH (ref 70–99)

## 2022-06-04 LAB — BASIC METABOLIC PANEL
Anion gap: 7 (ref 5–15)
BUN: 35 mg/dL — ABNORMAL HIGH (ref 8–23)
CO2: 26 mmol/L (ref 22–32)
Calcium: 8.9 mg/dL (ref 8.9–10.3)
Chloride: 103 mmol/L (ref 98–111)
Creatinine, Ser: 0.74 mg/dL (ref 0.61–1.24)
GFR, Estimated: >60 mL/min (ref >60)
Glucose, Bld: 120 mg/dL — ABNORMAL HIGH (ref 70–99)
Potassium: 4.5 mmol/L (ref 3.5–5.1)
Sodium: 136 mmol/L (ref 135–145)

## 2022-06-04 NOTE — Progress Notes (Signed)
Physical Therapy Treatment Patient Details Name: [name]. MRN: [id number] DOB: 26-Dec-1955 Today's Date: 06/04/2022   History of Present Illness Pt is a 66 y/o M admitted on 05/18/22 with concern for right ankle osteomyelitis after an injury in December 2023. Pt was seen in consultation by podiatry, who recommended continuation of empiric IV antibiotics, underwent angiogram & angioplasty of the right anterior tibial artery with vascular surgery on 01/22. After consideration, opted for & underwent R BKA 01/25 given low likelihood of healing with non-surgical management. PMH: COPD, chronic respiratory failure on home O2, CAD s/p CABG, IDDM2, OSA on bipap at night, BPH HTN, hypothyroidism, paroxysmal a-fib on eliquis & amiodarone, PAD, L BKA    PT Comments    Pt in bed.  Agrees to session but does state he is "lazy" today and does not want to get OOB.  Agrees to unsupported sitting ex.  Stated he has been doing HEP on his own so focus is on sitting unsupported, scooting in bed in sitting backwards and turning left/right.  Pt and wife had been resistant to hospital bed at home but he is in agreement that he will struggle in his regular bed at home and is now open to a hospital bed for home.  Reached out to [facility] per his request for a quote on his cost.  He is able to sit with and without UE support for +20 minutes during session and remains sitting unsupported in bed with all rails up for safety.  HOB is raised so he has back support if needed and tray in front of him for lunch.  He is encouraged to spend time unsupported sitting in room using rails for safety and assist.  Voiced understanding and has demonstrated safety with transition and sitting.  Pt strongly encouraged to have hospital bed at home as he will be extremely limited in a regular bed.   Recommendations for follow up therapy are one component of a multi-disciplinary discharge planning process, led by the attending physician.   Recommendations may be updated based on patient status, additional functional criteria and insurance authorization.  Follow Up Recommendations  Other (comment) (Pt is out of rehab days and unable to pay privately for rehab. Currently appealing DC home however is planning to DC directly home with [facility] services + spouse assistance. discussed benefit of hospital bed.)     Assistance Recommended at Discharge Frequent or constant Supervision/Assistance  Patient can return home with the following Two people to help with walking and/or transfers;A little help with bathing/dressing/bathroom;Help with stairs or ramp for entrance;Assist for transportation;Assistance with cooking/housework   Equipment Recommendations  Hospital bed (if pt remains agreeable)    Recommendations for Other Services       Precautions / Restrictions Precautions Precautions: Fall Restrictions Weight Bearing Restrictions: Yes RLE Weight Bearing: Non weight bearing Other Position/Activity Restrictions: S/p R BKA, hx L BKA     Mobility  Bed Mobility Overal bed mobility: Needs Assistance Bed Mobility: Supine to Sit, Sit to Supine     Supine to sit: HOB elevated, Min guard Sit to supine: Min guard, HOB elevated   General bed mobility comments: uses rails and HOB elevated to tranition in and out of sitting.  would need extensive assist without rails.    Transfers                   General transfer comment: declined OOB today    Ambulation/Gait  General Gait Details: unable   Stairs             Wheelchair Mobility    Modified Rankin (Stroke Patients Only)       Balance Overall balance assessment: Needs assistance Sitting-balance support: Bilateral upper extremity supported Sitting balance-Leahy Scale: Fair Sitting balance - Comments: does well today sitting without BUE supports       Standing balance comment: unable                             Cognition Arousal/Alertness: Awake/alert Behavior During Therapy: WFL for tasks assessed/performed Overall Cognitive Status: Within Functional Limits for tasks assessed                                          Exercises      General Comments        Pertinent Vitals/Pain Pain Assessment Pain Assessment: Faces Faces Pain Scale: Hurts little more Pain Location: R limb with activity Pain Descriptors / Indicators: Discomfort, Aching Pain Intervention(s): Limited activity within patient's tolerance, Monitored during session, Repositioned    Home Living                          Prior Function            PT Goals (current goals can now be found in the care plan section) Progress towards PT goals: Progressing toward goals    Frequency    7X/week      PT Plan Current plan remains appropriate    Co-evaluation              AM-PAC PT "6 Clicks" Mobility   Outcome Measure  Help needed turning from your back to your side while in a flat bed without using bedrails?: A Little Help needed moving from lying on your back to sitting on the side of a flat bed without using bedrails?: A Lot Help needed moving to and from a bed to a chair (including a wheelchair)?: Total Help needed standing up from a chair using your arms (e.g., wheelchair or bedside chair)?: Total Help needed to walk in hospital room?: Total Help needed climbing 3-5 steps with a railing? : Total 6 Click Score: 9    End of Session   Activity Tolerance: Patient limited by fatigue Patient left: in chair;with chair alarm set;with call bell/phone within reach Nurse Communication: Mobility status PT Visit Diagnosis: Muscle weakness (generalized) (M62.81);Other abnormalities of gait and mobility (R26.89);Pain Pain - Right/Left: Right Pain - part of body: Leg     Time: 4098-1191 PT Time Calculation (min) (ACUTE ONLY): 23 min  Charges:  $Therapeutic Activity: 23-37 mins                     Chesley Noon, PTA 06/04/22, 12:41 PM

## 2022-06-04 NOTE — Progress Notes (Signed)
PROGRESS NOTE    [name].   HER:[id number] DOB: [birth year]  DOA: 05/18/2022 Date of Service: 06/04/22 PCP: [name], MD  Brief Narrative / Hospital Course:  This is a pleasant 67 year old gentleman with a history of COPD, chronic respiratory failure on home O2, CAD status post CABG, insulin-dependent type 2 diabetes, OSA on BiPAP at night, BPH hypertension, hypothyroidism and paroxysmal atrial fibrillation on Eliquis and amiodarone who has peripheral arterial disease status post left BKA and is admitted to the hospital with concern for right ankle osteomyelitis after an injury in December 2023.  He was seen in consultation by podiatry, who recommends continuation of empiric IV antibiotics, underwent angiogram and angioplasty of the right anterior tibial artery with vascular surgery on 01/22. After consideration, opted for R BKA 01/24 given low likelihood of healing with non-surgical management. Awaiting SNF placement. Stopped abx 01/26 Friday. Planning SNF rehab placement after the  weekend, however days are used up and cannot afford out of pocket, confirming DME and HH  - medically stable for dc 01/30 - however wife is ill with COVID and is in isolation until Friday. She provides him with a good portion of care and will be unable to do so while isolating and ill. Patient is SNF level care appropriate and has new amputation so would not be safe to dc home without continuous supervision and care. His dc will have to be delayed until safe home plan can be established. He has not been able to independently transfer out of bed post-op yet. He will continue to work with PT and we can continue to monitor for when patient will be safe to dc home. He reports no one able to meet him at home today for discharge but reports he can and will arrange that for tomorrow  Consultants:  Podiatry Vascular Surgery   Procedures: 05/21/22 RLE Angiogram with angioplasty  05/24/22 R below-knee  amputation   ASSESSMENT & PLAN:   Principal Problem:   Non-healing wound of lower extremity, initial encounter Active Problems:   Acute osteomyelitis of right foot (HCC)   PAD (peripheral artery disease) (HCC)   S/P BKA (below knee amputation), left (HCC)   AKI (acute kidney injury) ([place])   Hyperkalemia   Chronic anticoagulation   AF (paroxysmal atrial fibrillation) ([place])   Uncontrolled type 2 diabetes mellitus with hypoglycemia, with long-term current use of insulin (HCC)   OSA (obstructive sleep apnea)   Chronic respiratory failure with hypoxia (HCC)   COPD (chronic obstructive pulmonary disease) (HCC)   Chronic diastolic CHF (congestive heart failure) ([place])   CAD with Hx of CABG   Essential hypertension   BPH (benign prostatic hyperplasia)  Acute osteomyelitis of right foot and ankle ([place]) Nonhealing wound right ankle PAD s/p right BKA 1/25 S/p angiogram with angioplasty 1/22 with vascular surgery Wound appears to be healing well completed cefepime and vancomycin 05/25/22  Plan home w/ [facility] - SNF days used up and pt/family cannot afford rehab care Vascular team ok for Eliquis, d/c clopidogrel   Continue PT/OT Analgesia PRN Confirmed w/ vascular needs outpt f/u 3-4 weeks after surgery I.e. 2/15-2/22 Home health orders placed  Patient is and has been for some time stable for discharge. Discharge orders placed 2/3, family is appealing. That appeal was denied 2/4, they are now appealing the appeal PT advising hospital bed; TOC investigating that  Anemia Chronic, worse now d/t likely myelosuppression/chronic illness + recent surgery 1 unit PRBC this admission, repeat HH improved and  stable Monitor CBC weekly Caution given fall risk and AC/ASA  Uncontrolled type 2 diabetes mellitus with hypoglycemia, with long-term current use of insulin (HCC) Blood sugar in the 300s on admission initially, now better controlled - cont basal/bolus, SSI   COPD OSA on home BiPAP qhs Chronic  respiratory failure on home O2 Patient is at baseline Continue home Pulmicort and inhalers Continue BiPAP and O2 at night and anytime he is sleeping Encourage CPAP use QHS   PAF on Chronic anticoagulation CAD with Hx of CABG Chronic Essential hypertension  HFpEF- EF 55-60% Continue aspirin + Eliquis based on cardiology recs Continue ezetimibe, Imdur, amiodarone Continue losartan   BPH (benign prostatic hyperplasia) Continue finasteride and tamsulosin  Hyperkalemia - resolved Monitor BMP   AKI - resolved Monitor BMP    DVT prophylaxis: eliquis Central lines / invasive devices: none   Code Status: FULL CODE   Current Admission Status: inpatient   TOC needs / Dispo plan: home with home health Barriers to discharge / significant pending items: family appealing discharge (PT advises snf but would need to pay out of pocket and patient can't/won't. Stable for discharge and discharge order placed, wife is appealing  Subjective / Brief ROS:  Pt reports physically feeling well today. Denies complaints.   Family Communication: none @ bedside   Objective Findings:  Vitals:   06/03/22 1638 06/03/22 2058 06/03/22 2117 06/04/22 0729  BP: 138/69  121/66 129/62  Pulse: 88  83 86  Resp: '18  17 16  '$ Temp: 98.4 F (36.9 C)  98.5 F (36.9 C) 98.3 F (36.8 C)  TempSrc:   Oral   SpO2: 96% 95% 96% 95%  Weight:      Height:        Intake/Output Summary (Last 24 hours) at 06/04/2022 1330 Last data filed at 06/04/2022 0744 Gross per 24 hour  Intake 240 ml  Output 2350 ml  Net -2110 ml    Filed Weights   05/18/22 2150  Weight: 135.5 kg    Examination:  Physical Exam Constitutional:      Appearance: Normal appearance. He is obese. He is not ill-appearing.  Cardiovascular:     Rate and Rhythm: Normal rate and regular rhythm.  Pulmonary:     Effort: Pulmonary effort is normal.     Breath sounds: Normal breath sounds.  Abdominal:     Palpations: Abdomen is soft.   Musculoskeletal:     Right lower leg: Tenderness present.     Left lower leg: Normal. No tenderness.     Comments: LLE BKA, RLE BKA     Right Lower Extremity: Right leg is amputated below knee. (surgical incision healing well. stables in place. no signs of infection. no dignificant amount of bleeding)    Left Lower Extremity: Left leg is amputated below knee.  Skin:    General: Skin is dry.  Neurological:     General: No focal deficit present.     Mental Status: He is alert and oriented to person, place, and time.  Psychiatric:        Behavior: Behavior normal.        Thought Content: Thought content normal.     Scheduled Medications:   amiodarone  200 mg Oral Daily   apixaban  5 mg Oral BID   ascorbic acid  500 mg Oral BID   aspirin EC  81 mg Oral Daily   budesonide  0.5 mg Nebulization BID   cyanocobalamin  5,000 mcg Oral Daily  ezetimibe  10 mg Oral Daily   ferrous sulfate  325 mg Oral Q breakfast   finasteride  5 mg Oral Daily   gabapentin  300 mg Oral QHS   insulin aspart  0-20 Units Subcutaneous TID WC   insulin aspart  14 Units Subcutaneous TID WC   insulin glargine-yfgn  28 Units Subcutaneous Daily   isosorbide mononitrate  60 mg Oral Daily   levothyroxine  25 mcg Oral Q0600   losartan  25 mg Oral Daily   multivitamin with minerals  1 tablet Oral Daily   nutrition supplement (JUVEN)  1 packet Oral BID BM   pramipexole  2 mg Oral QHS   primidone  250 mg Oral BID   Ensure Max Protein  11 oz Oral QHS   risperiDONE  0.5 mg Oral BID   tamsulosin  0.4 mg Oral Daily   PRN Medications:  acetaminophen **OR** acetaminophen, albuterol, HYDROcodone-acetaminophen, ondansetron **OR** ondansetron (ZOFRAN) IV, traZODone  Antimicrobials from admission:  Anti-infectives (From admission, onward)    Start     Dose/Rate Route Frequency Ordered Stop   05/24/22 1000  vancomycin (VANCOREADY) IVPB 1500 mg/300 mL        1,500 mg 150 mL/hr over 120 Minutes Intravenous 120 min  pre-op 05/23/22 1830 05/25/22 1459   05/21/22 1655  ciprofloxacin (CIPRO) IVPB  Status:  Discontinued        over 60 Minutes  Continuous PRN 05/21/22 1655 05/21/22 1737   05/21/22 1515  ciprofloxacin (CIPRO) IVPB 400 mg  Status:  Discontinued        400 mg 200 mL/hr over 60 Minutes Intravenous 60 min pre-op 05/21/22 1515 05/21/22 1736   05/19/22 1200  vancomycin (VANCOREADY) IVPB 1250 mg/250 mL  Status:  Discontinued        1,250 mg 166.7 mL/hr over 90 Minutes Intravenous Every 12 hours 05/19/22 0739 05/25/22 1511   05/19/22 0100  vancomycin (VANCOREADY) IVPB 500 mg/100 mL       See Hyperspace for full Linked Orders Report.   500 mg 100 mL/hr over 60 Minutes Intravenous  Once 05/18/22 2209 05/19/22 0241   05/18/22 2300  vancomycin (VANCOREADY) IVPB 2000 mg/400 mL       See Hyperspace for full Linked Orders Report.   2,000 mg 200 mL/hr over 120 Minutes Intravenous  Once 05/18/22 2209 05/19/22 0138   05/18/22 2300  ceFEPIme (MAXIPIME) 2 g in sodium chloride 0.9 % 100 mL IVPB  Status:  Discontinued        2 g 200 mL/hr over 30 Minutes Intravenous Every 8 hours 05/18/22 2209 05/25/22 1511   05/18/22 2210  vancomycin variable dose per unstable renal function (pharmacist dosing)  Status:  Discontinued         Does not apply See admin instructions 05/18/22 2211 05/20/22 0908   05/18/22 2145  metroNIDAZOLE (FLAGYL) tablet 500 mg        500 mg Oral Every 12 hours 05/18/22 2116 05/25/22 2144   05/18/22 2000  metroNIDAZOLE (FLAGYL) IVPB 500 mg  Status:  Discontinued        500 mg 100 mL/hr over 60 Minutes Intravenous  Once 05/18/22 1948 05/18/22 2144   05/18/22 2000  ceFEPIme (MAXIPIME) 2 g in sodium chloride 0.9 % 100 mL IVPB  Status:  Discontinued        2 g 200 mL/hr over 30 Minutes Intravenous  Once 05/18/22 1959 05/18/22 2209   05/18/22 2000  vancomycin (VANCOCIN) IVPB 1000 mg/200 mL premix  Status:  Discontinued        1,000 mg 200 mL/hr over 60 Minutes Intravenous  Once 05/18/22 1959  05/18/22 2209      Data Reviewed:  I have personally reviewed the following.  CBC: Recent Labs  Lab 05/29/22 0237 05/30/22 0621 05/31/22 0626 06/04/22 0343  WBC 11.2* 12.1* 11.0* 11.0*  HGB 7.6* 8.6* 8.6* 8.5*  HCT 24.8* 27.4* 27.7* 27.6*  MCV 90.2 89.8 91.1 92.6  PLT 367 392 386 111    Basic Metabolic Panel: Recent Labs  Lab 05/29/22 0237 05/30/22 0621 06/04/22 0343  NA 134* 136 136  K 3.9 3.9 4.5  CL 106 107 103  CO2 '25 25 26  '$ GLUCOSE 211* 125* 120*  BUN 30* 25* 35*  CREATININE 0.77 0.66 0.74  CALCIUM 8.3* 8.6* 8.9    CBG: Recent Labs  Lab 06/03/22 1157 06/03/22 1636 06/03/22 2113 06/04/22 0725 06/04/22 1216  GLUCAP 187* 301* 210* 132* 248*    Most Recent Urinalysis On File:     Component Value Date/Time   COLORURINE YELLOW (A) 05/20/2022 0134   APPEARANCEUR CLEAR (A) 05/20/2022 0134   LABSPEC 1.013 05/20/2022 0134   PHURINE 6.0 05/20/2022 0134   GLUCOSEU 50 (A) 05/20/2022 0134   HGBUR NEGATIVE 05/20/2022 0134   BILIRUBINUR NEGATIVE 05/20/2022 0134   KETONESUR NEGATIVE 05/20/2022 0134   PROTEINUR 100 (A) 05/20/2022 0134   NITRITE NEGATIVE 05/20/2022 0134   LEUKOCYTESUR NEGATIVE 05/20/2022 0134     LOS: 34 days     Desma Maxim, MD [facility] 06/04/2022, 1:30 PM    If 7PM-7AM, please contact night coverage [email or web address]

## 2022-06-04 NOTE — Plan of Care (Signed)
  Problem: Education: Goal: Knowledge of General Education information will improve Description: Including pain rating scale, medication(s)/side effects and non-pharmacologic comfort measures Outcome: Progressing   Problem: Health Behavior/Discharge Planning: Goal: Ability to manage health-related needs will improve Outcome: Progressing   Problem: Clinical Measurements: Goal: Ability to maintain clinical measurements within normal limits will improve Outcome: Progressing Goal: Will remain free from infection Outcome: Progressing Goal: Diagnostic test results will improve Outcome: Progressing   Problem: Activity: Goal: Risk for activity intolerance will decrease Outcome: Progressing   Problem: Nutrition: Goal: Adequate nutrition will be maintained Outcome: Progressing   Problem: Elimination: Goal: Will not experience complications related to urinary retention Outcome: Progressing   Problem: Pain Managment: Goal: General experience of comfort will improve Outcome: Progressing

## 2022-06-04 NOTE — TOC Progression Note (Signed)
Transition of Care (TOC) - Progression Note    Patient Details  Name: [name]. MRN: [id number] Date of Birth: [birth year]  Transition of Care [facility]) CM/SW [place], RN Phone Number: 06/04/2022, 12:25 PM  Clinical Narrative:     Reached out to [employer] to have them determine the patient's cost for a hospital bed, awaiting a response   Expected Discharge Plan: [place] Barriers to Discharge: Continued Medical Work up  Expected Discharge Plan and Services   Discharge Planning Services: CM Consult   Living arrangements for the past 2 months: [place] Expected Discharge Date: 06/02/22                 DME Agency: [employer]       HH Arranged: RN [place] Agency: [facility] Date [place] Agency Contacted: 05/30/22 Time [place]: 1110 Representative spoke with at [place]: [place] ([place]) Interventions SDOH Screenings   Food Insecurity: No Food Insecurity (05/19/2022)  Housing: Low Risk  (05/19/2022)  Transportation Needs: No Transportation Needs (05/19/2022)  Recent Concern: Transportation Needs - Unmet Transportation Needs (03/21/2022)  Utilities: Not At Risk (05/19/2022)  Depression (PHQ2-9): Low Risk  (11/21/2021)  Tobacco Use: Medium Risk (05/25/2022)    Readmission Risk Interventions    02/13/2022    4:57 PM 09/29/2021   11:12 AM  Readmission Risk Prevention Plan  Transportation Screening Complete Complete  PCP or Specialist Appt within 3-5 Days  Complete  HRI or [place]  Complete  Social Work Consult for [place] Planning/Counseling  [place]  Not Applicable  Medication Review Press photographer) Complete Complete  PCP or Specialist appointment within 3-5 days of discharge Complete   HRI or [place] Complete   SW Recovery Care/Counseling Consult Complete   [place]  Not Applicable

## 2022-06-05 DIAGNOSIS — S81809A Unspecified open wound, unspecified lower leg, initial encounter: Secondary | ICD-10-CM | POA: Diagnosis not present

## 2022-06-05 LAB — CBC
HCT: 27.4 % — ABNORMAL LOW (ref 39.0–52.0)
Hemoglobin: 8.4 g/dL — ABNORMAL LOW (ref 13.0–17.0)
MCH: 28.4 pg (ref 26.0–34.0)
MCHC: 30.7 g/dL (ref 30.0–36.0)
MCV: 92.6 fL (ref 80.0–100.0)
Platelets: 349 10*3/uL (ref 150–400)
RBC: 2.96 MIL/uL — ABNORMAL LOW (ref 4.22–5.81)
RDW: 16 % — ABNORMAL HIGH (ref 11.5–15.5)
WBC: 10.9 10*3/uL — ABNORMAL HIGH (ref 4.0–10.5)
nRBC: 0 % (ref 0.0–0.2)

## 2022-06-05 LAB — BASIC METABOLIC PANEL
Anion gap: 5 (ref 5–15)
BUN: 35 mg/dL — ABNORMAL HIGH (ref 8–23)
CO2: 27 mmol/L (ref 22–32)
Calcium: 8.5 mg/dL — ABNORMAL LOW (ref 8.9–10.3)
Chloride: 100 mmol/L (ref 98–111)
Creatinine, Ser: 0.79 mg/dL (ref 0.61–1.24)
GFR, Estimated: >60 mL/min (ref >60)
Glucose, Bld: 223 mg/dL — ABNORMAL HIGH (ref 70–99)
Potassium: 4.5 mmol/L (ref 3.5–5.1)
Sodium: 132 mmol/L — ABNORMAL LOW (ref 135–145)

## 2022-06-05 LAB — GLUCOSE, CAPILLARY
Glucose-Capillary: 165 mg/dL — ABNORMAL HIGH (ref 70–99)
Glucose-Capillary: 205 mg/dL — ABNORMAL HIGH (ref 70–99)
Glucose-Capillary: 154 mg/dL — ABNORMAL HIGH (ref 70–99)
Glucose-Capillary: 153 mg/dL — ABNORMAL HIGH (ref 70–99)

## 2022-06-05 MED ORDER — INSULIN GLARGINE-YFGN 100 UNIT/ML ~~LOC~~ SOLN
30.0000 [IU] | Freq: Every day | SUBCUTANEOUS | Status: DC
  Administered 2022-06-06: 30 [IU] via SUBCUTANEOUS
  Filled 2022-06-05: qty 0.3

## 2022-06-05 MED ORDER — INSULIN ASPART 100 UNIT/ML IJ SOLN
16.0000 [IU] | Freq: Three times a day (TID) | INTRAMUSCULAR | Status: DC
  Administered 2022-06-05 – 2022-06-06 (×4): 16 [IU] via SUBCUTANEOUS
  Filled 2022-06-05 (×3): qty 1

## 2022-06-05 NOTE — Progress Notes (Signed)
Patient has CHF, Non healing wound, BKA which requires upper body to be positioned in ways not feasible with a normal bed. Head must be elevated at least 45 degrees or has difficulty breathing .

## 2022-06-05 NOTE — TOC Progression Note (Signed)
Transition of Care (TOC) - Progression Note    Patient Details  Name: [name] MRN: [id number] Date of Birth: 01-27-1956  Transition of Care [facility]) CM/SW [place], RN Phone Number: 06/05/2022, 1:28 PM  Clinical Narrative:    The patient requested to get the price of a Hospital Bed, [place] DME company provided him with the cost, he declined getting a hospital bed at this time   Expected Discharge Plan: [place] Barriers to Discharge: Continued Medical Work up  Expected Discharge Plan and Services   Discharge Planning Services: CM Consult   Living arrangements for the past 2 months: [place] Expected Discharge Date: 06/02/22                 DME Agency: [employer]       HH Arranged: RN [place] Agency: [facility] Date [place] Agency Contacted: 05/30/22 Time [place]: 1110 Representative spoke with at [place]: [place] ([place]) Interventions SDOH Screenings   Food Insecurity: No Food Insecurity (05/19/2022)  Housing: Low Risk  (05/19/2022)  Transportation Needs: No Transportation Needs (05/19/2022)  Recent Concern: Transportation Needs - Unmet Transportation Needs (03/21/2022)  Utilities: Not At Risk (05/19/2022)  Depression (PHQ2-9): Low Risk  (11/21/2021)  Tobacco Use: Medium Risk (05/25/2022)    Readmission Risk Interventions    02/13/2022    4:57 PM 09/29/2021   11:12 AM  Readmission Risk Prevention Plan  Transportation Screening Complete Complete  PCP or Specialist Appt within 3-5 Days  Complete  HRI or [place]  Complete  Social Work Consult for [place] Planning/Counseling  [place]  Not Applicable  Medication Review Press photographer) Complete Complete  PCP or Specialist appointment within 3-5 days of discharge Complete   HRI or [place] Complete   SW Recovery Care/Counseling Consult Complete   [place] Not Applicable

## 2022-06-05 NOTE — Progress Notes (Signed)
Occupational Therapy Treatment Patient Details Name: [name]. MRN: [id number] DOB: 04-Aug-1955 Today's Date: 06/05/2022   History of present illness Pt is a 67 y/o M admitted on 05/18/22 with concern for right ankle osteomyelitis after an injury in December 2023. Pt was seen in consultation by podiatry, who recommended continuation of empiric IV antibiotics, underwent angiogram & angioplasty of the right anterior tibial artery with vascular surgery on 01/22. After consideration, opted for & underwent R BKA 01/25 given low likelihood of healing with non-surgical management. PMH: COPD, chronic respiratory failure on home O2, CAD s/p CABG, IDDM2, OSA on bipap at night, BPH HTN, hypothyroidism, paroxysmal a-fib on eliquis & amiodarone, PAD, L BKA   OT comments  [name] was seen for OT treatment on this date. Upon arrival to room pt sleeping supine in bed, awakes and agreeable to tx. Pt requires MIN A sup<>long sit. Attmepted posterior t/f at bed level, MOD A to complete with poor tolerance to scoot backwards in bed. MIN A to complete hip flexor stretches in side lying, reviewed HEP. MIN A L+R rolling. Pt making good progress toward goals, will continue to follow POC. Discharge recommendation remains appropriate.     Recommendations for follow up therapy are one component of a multi-disciplinary discharge planning process, led by the attending physician.  Recommendations may be updated based on patient status, additional functional criteria and insurance authorization.    Follow Up Recommendations  Skilled nursing-short term rehab (<3 hours/day)     Assistance Recommended at Discharge Frequent or constant Supervision/Assistance  Patient can return home with the following  Two people to help with bathing/dressing/bathroom;Two people to help with walking and/or transfers;Help with stairs or ramp for entrance;Assistance with cooking/housework;Assist for transportation   Equipment  Recommendations  Hospital bed    Recommendations for Other Services      Precautions / Restrictions Precautions Precautions: Fall Restrictions Weight Bearing Restrictions: Yes RLE Weight Bearing: Non weight bearing       Mobility Bed Mobility Overal bed mobility: Needs Assistance Bed Mobility: Rolling Rolling: Min assist         General bed mobility comments: MIN A sup<>long sit. Attmepted posterior t/f at bed level, MOD A to complete.    Transfers                   General transfer comment: declined OOB today     Balance                                           ADL either performed or assessed with clinical judgement   ADL Overall ADL's : Needs assistance/impaired                                       General ADL Comments: MIN A for toileting bed level      Cognition Arousal/Alertness: Awake/alert Behavior During Therapy: WFL for tasks assessed/performed Overall Cognitive Status: Within Functional Limits for tasks assessed                                 General Comments: good insight into need for hospital bed at home for safety        Exercises Other Exercises Other Exercises:  MIN A to complete hip flexor stretches in side lying, reviewed HEP            Pertinent Vitals/ Pain       Pain Assessment Pain Assessment: No/denies pain   Frequency  Min 2X/week        Progress Toward Goals  OT Goals(current goals can now be found in the care plan section)  Progress towards OT goals: Progressing toward goals  Acute Rehab OT Goals Patient Stated Goal: to get stronger OT Goal Formulation: With patient Time For Goal Achievement: 06/08/22 Potential to Achieve Goals: Good ADL Goals Pt Will Perform Grooming: sitting;with set-up;with supervision Pt Will Perform Lower Body Dressing: sitting/lateral leans;with min assist Pt Will Transfer to Toilet: bedside commode;anterior/posterior  transfer;with transfer board;with min assist Pt Will Perform Toileting - Clothing Manipulation and hygiene: sitting/lateral leans;with min assist;with adaptive equipment  Plan Discharge plan remains appropriate;Frequency remains appropriate    Co-evaluation                 AM-PAC OT "6 Clicks" Daily Activity     Outcome Measure   Help from another person eating meals?: None Help from another person taking care of personal grooming?: A Little Help from another person toileting, which includes using toliet, bedpan, or urinal?: Total Help from another person bathing (including washing, rinsing, drying)?: A Lot Help from another person to put on and taking off regular upper body clothing?: A Little Help from another person to put on and taking off regular lower body clothing?: Total 6 Click Score: 14    End of Session    OT Visit Diagnosis: Other abnormalities of gait and mobility (R26.89);Pain Pain - Right/Left: Right Pain - part of body: Knee;Leg   Activity Tolerance Patient tolerated treatment well   Patient Left in bed;with call bell/phone within reach   Nurse Communication          Time: 0998-3382 OT Time Calculation (min): 10 min  Charges: OT General Charges $OT Visit: 1 Visit OT Treatments $Therapeutic Exercise: 8-22 mins  Dessie Coma, M.S. OTR/L  06/05/22, 3:23 PM  ascom [phone]

## 2022-06-05 NOTE — Anesthesia Postprocedure Evaluation (Signed)
Anesthesia Post Note  Patient: [name].  Procedure(s) Performed: AMPUTATION BELOW KNEE (Right: Knee)  Patient location during evaluation: PACU Anesthesia Type: General Level of consciousness: awake and alert Pain management: pain level controlled Vital Signs Assessment: post-procedure vital signs reviewed and stable Respiratory status: spontaneous breathing, nonlabored ventilation, respiratory function stable and patient connected to nasal cannula oxygen Cardiovascular status: blood pressure returned to baseline and stable Postop Assessment: no apparent nausea or vomiting Anesthetic complications: no   No notable events documented.   Last Vitals:  Vitals:   06/04/22 1545 06/04/22 2110  BP: 127/71 (!) 141/77  Pulse: 84 91  Resp: 16 18  Temp: 36.6 C 36.9 C  SpO2: 99% 96%    Last Pain:  Vitals:   06/04/22 2245  TempSrc:   PainSc: 0-No pain                 Alphonsus Sias

## 2022-06-05 NOTE — Inpatient Diabetes Management (Signed)
Inpatient Diabetes Program Recommendations  AACE/ADA: New Consensus Statement on Inpatient Glycemic Control  Target Ranges:  Prepandial:   less than 140 mg/dL      Peak postprandial:   less than 180 mg/dL (1-2 hours)      Critically ill patients:  140 - 180 mg/dL    Latest Reference Range & Units 06/04/22 07:25 06/04/22 12:16 06/04/22 16:41 06/04/22 20:37 06/05/22 08:19  Glucose-Capillary 70 - 99 mg/dL 132 (H) 248 (H) 180 (H) 196 (H) 165 (H)   Review of Glycemic Control  Diabetes history: DM2 Outpatient Diabetes medications: Semglee 40 units QAM, Novolog 15 units TID with meals Current orders for Inpatient glycemic control: Semglee 28 units QAM, Novolog 14 units TID with meals, Novolog 0-20 units TID with meals   Inpatient Diabetes Program Recommendations:     Insulin: Please consider increasing meal coverage to Novolog 16 units TID with meals.   Thanks, Barnie Alderman, RN, MSN, [place] Diabetes Coordinator Inpatient Diabetes Program [phone] (Team Pager from 8am to [place])

## 2022-06-05 NOTE — Progress Notes (Signed)
Physical Therapy Treatment Patient Details Name: [name]. MRN: [id number] DOB: Sep 08, 1955 Today's Date: 06/05/2022   History of Present Illness Pt is a 67 y/o M admitted on 05/18/22 with concern for right ankle osteomyelitis after an injury in December 2023. Pt was seen in consultation by podiatry, who recommended continuation of empiric IV antibiotics, underwent angiogram & angioplasty of the right anterior tibial artery with vascular surgery on 01/22. After consideration, opted for & underwent R BKA 01/25 given low likelihood of healing with non-surgical management. PMH: COPD, chronic respiratory failure on home O2, CAD s/p CABG, IDDM2, OSA on bipap at night, BPH HTN, hypothyroidism, paroxysmal a-fib on eliquis & amiodarone, PAD, L BKA       Recommendations for follow up therapy are one component of a multi-disciplinary discharge planning process, led by the attending physician.  Recommendations may be updated based on patient status, additional functional criteria and insurance authorization.  Follow Up Recommendations  Skilled nursing-short term rehab (<3 hours/day) (SNF most appropriate however pt is out of rehab days and does not want to pay out of pocket)     Assistance Recommended at Discharge Frequent or constant Supervision/Assistance  Patient can return home with the following Two people to help with walking and/or transfers;A little help with bathing/dressing/bathroom;Help with stairs or ramp for entrance;Assist for transportation;Assistance with cooking/housework   Equipment Recommendations  Hospital bed (currently pt's spouse refusing but pt is agreeable)       Precautions / Restrictions Precautions Precautions: Fall Restrictions Weight Bearing Restrictions: Yes RLE Weight Bearing: Non weight bearing     Mobility  Bed Mobility Overal bed mobility: Needs Assistance Bed Mobility: Rolling Rolling: Min assist Supine to sit: Mod assist, Max assist Sit to  supine: Mod assist   Transfers      General transfer comment: pt declined OOB activity today. pt did sit EOB and performed ther ex    Ambulation/Gait  General Gait Details: unable   Balance Overall balance assessment: Needs assistance Sitting-balance support: Bilateral upper extremity supported Sitting balance-Leahy Scale: Fair Sitting balance - Comments: pt was able to sit with BUE support with close SBA. without UE support requires min assist at time to prevent LOB     Cognition Arousal/Alertness: Awake/alert Behavior During Therapy: WFL for tasks assessed/performed Overall Cognitive Status: Within Functional Limits for tasks assessed  General Comments: Pt is A and O but frustrated with spouse about not wanting him to have a hospital bed.           General Comments General comments (skin integrity, edema, etc.): Pt performed LAQ, seated marching, manually resisted hip Abd/add and UE ther ex      Pertinent Vitals/Pain Pain Assessment Pain Assessment: 0-10 Pain Score: 5  Pain Location: R limb with activity Pain Descriptors / Indicators: Discomfort, Aching Pain Intervention(s): Monitored during session, Limited activity within patient's tolerance, Premedicated before session, Repositioned     PT Goals (current goals can now be found in the care plan section) Acute Rehab PT Goals Patient Stated Goal: go home when I'm ready Progress towards PT goals: Progressing toward goals    Frequency    7X/week      PT Plan Current plan remains appropriate       AM-PAC PT "6 Clicks" Mobility   Outcome Measure  Help needed turning from your back to your side while in a flat bed without using bedrails?: A Little Help needed moving from lying on your back to sitting on the side of a  flat bed without using bedrails?: A Lot Help needed moving to and from a bed to a chair (including a wheelchair)?: A Lot Help needed standing up from a chair using your arms (e.g., wheelchair or  bedside chair)?: Total Help needed to walk in hospital room?: Total Help needed climbing 3-5 steps with a railing? : Total 6 Click Score: 10    End of Session   Activity Tolerance: Patient tolerated treatment well Patient left: in bed;with call bell/phone within reach;with bed alarm set Nurse Communication: Mobility status PT Visit Diagnosis: Muscle weakness (generalized) (M62.81);Other abnormalities of gait and mobility (R26.89);Pain Pain - Right/Left: Right Pain - part of body: Leg     Time: 1281-1886 PT Time Calculation (min) (ACUTE ONLY): 15 min  Charges:  $Therapeutic Exercise: 8-22 mins                     Julaine Fusi PTA 06/05/22, 4:32 PM

## 2022-06-05 NOTE — Progress Notes (Signed)
PROGRESS NOTE    [name].   ZOX:[id number] DOB: [birth year]  DOA: 05/18/2022 Date of Service: 06/05/22 PCP: [name], MD  Brief Narrative / Hospital Course:  This is a pleasant 67 year old gentleman with a history of COPD, chronic respiratory failure on home O2, CAD status post CABG, insulin-dependent type 2 diabetes, OSA on BiPAP at night, BPH hypertension, hypothyroidism and paroxysmal atrial fibrillation on Eliquis and amiodarone who has peripheral arterial disease status post left BKA and is admitted to the hospital with concern for right ankle osteomyelitis after an injury in December 2023.  He was seen in consultation by podiatry, who recommends continuation of empiric IV antibiotics, underwent angiogram and angioplasty of the right anterior tibial artery with vascular surgery on 01/22. After consideration, opted for R BKA 01/24 given low likelihood of healing with non-surgical management. Awaiting SNF placement. Stopped abx 01/26 Friday. Planning SNF rehab placement after the  weekend, however days are used up and cannot afford out of pocket, confirming DME and HH  - medically stable for dc 01/30 - however wife is ill with COVID and is in isolation until Friday. She provides him with a good portion of care and will be unable to do so while isolating and ill. Patient is SNF level care appropriate and has new amputation so would not be safe to dc home without continuous supervision and care. His dc will have to be delayed until safe home plan can be established. He has not been able to independently transfer out of bed post-op yet. He will continue to work with PT and we can continue to monitor for when patient will be safe to dc home. He reports no one able to meet him at home today for discharge but reports he can and will arrange that for tomorrow  Consultants:  Podiatry Vascular Surgery   Procedures: 05/21/22 RLE Angiogram with angioplasty  05/24/22 R below-knee  amputation   ASSESSMENT & PLAN:   Principal Problem:   Non-healing wound of lower extremity, initial encounter Active Problems:   Acute osteomyelitis of right foot (HCC)   PAD (peripheral artery disease) (HCC)   S/P BKA (below knee amputation), left (HCC)   AKI (acute kidney injury) ([place])   Hyperkalemia   Chronic anticoagulation   AF (paroxysmal atrial fibrillation) ([place])   Uncontrolled type 2 diabetes mellitus with hypoglycemia, with long-term current use of insulin (HCC)   OSA (obstructive sleep apnea)   Chronic respiratory failure with hypoxia (HCC)   COPD (chronic obstructive pulmonary disease) (HCC)   Chronic diastolic CHF (congestive heart failure) ([place])   CAD with Hx of CABG   Essential hypertension   BPH (benign prostatic hyperplasia)  Acute osteomyelitis of right foot and ankle ([place]) Nonhealing wound right ankle PAD s/p right BKA 1/25 S/p angiogram with angioplasty 1/22 with vascular surgery Wound appears to be healing well completed cefepime and vancomycin 05/25/22  Plan home w/ [facility] - SNF days used up and pt/family cannot afford rehab care Vascular team ok for Eliquis, d/c clopidogrel   Continue PT/OT Analgesia PRN Confirmed w/ vascular needs outpt f/u 3-4 weeks after surgery I.e. 2/15-2/22 Home health orders placed  Patient is and has been for some time stable for discharge. Discharge orders placed 2/3, family is appealing. That appeal was denied 2/4, they are now appealing the appeal PT advising hospital bed; TOC investigating that  Anemia Chronic, worse now d/t likely myelosuppression/chronic illness + recent surgery 1 unit [facility]BC this admission, repeat HH improved and  stable Monitor CBC weekly Caution given fall risk and AC/ASA  Uncontrolled type 2 diabetes mellitus with hypoglycemia, with long-term current use of insulin (HCC) Blood sugar in the 300s on admission initially, now better controlled but still in 200s today - cont basal/bolus, SSI. Will increase  mealtime and long-acting.   COPD OSA on home BiPAP qhs Chronic respiratory failure on home O2 Patient is at baseline Continue home Pulmicort and inhalers Continue BiPAP and O2 at night and anytime he is sleeping Encourage CPAP use QHS   PAF on Chronic anticoagulation CAD with Hx of CABG Chronic Essential hypertension  HFpEF- EF 55-60% Continue aspirin + Eliquis based on cardiology recs Continue ezetimibe, Imdur, amiodarone Continue losartan   BPH (benign prostatic hyperplasia) Continue finasteride and tamsulosin  Hyperkalemia - resolved Monitor BMP   AKI - resolved Monitor BMP    DVT prophylaxis: eliquis Central lines / invasive devices: none   Code Status: FULL CODE   Current Admission Status: inpatient   TOC needs / Dispo plan: home with home health Barriers to discharge / significant pending items: family appealing discharge (PT advises snf but would need to pay out of pocket and patient can't/won't. Stable for discharge and discharge order placed, wife is appealing  Subjective / Brief ROS:  Pt reports physically feeling well today. Denies complaints.   Family Communication: none @ bedside   Objective Findings:  Vitals:   06/04/22 1545 06/04/22 2110 06/05/22 0816 06/05/22 0909  BP: 127/71 (!) 141/77 125/74   Pulse: 84 91 79   Resp: '16 18 16   '$ Temp: 97.9 F (36.6 C) 98.4 F (36.9 C) 97.9 F (36.6 C)   TempSrc:      SpO2: 99% 96% 95% 95%  Weight:      Height:        Intake/Output Summary (Last 24 hours) at 06/05/2022 1247 Last data filed at 06/05/2022 0640 Gross per 24 hour  Intake --  Output 2300 ml  Net -2300 ml    Filed Weights   05/18/22 2150  Weight: 135.5 kg    Examination:  Physical Exam Constitutional:      Appearance: Normal appearance. He is obese. He is not ill-appearing.  Cardiovascular:     Rate and Rhythm: Normal rate and regular rhythm.  Pulmonary:     Effort: Pulmonary effort is normal.     Breath sounds: Normal breath  sounds.  Abdominal:     Palpations: Abdomen is soft.  Musculoskeletal:     Right lower leg: Tenderness present.     Left lower leg: Normal. No tenderness.     Comments: LLE BKA, RLE BKA     Right Lower Extremity: Right leg is amputated below knee. (surgical incision healing well. stables in place. no signs of infection. no dignificant amount of bleeding)    Left Lower Extremity: Left leg is amputated below knee.  Skin:    General: Skin is dry.  Neurological:     General: No focal deficit present.     Mental Status: He is alert and oriented to person, place, and time.  Psychiatric:        Behavior: Behavior normal.        Thought Content: Thought content normal.     Scheduled Medications:   amiodarone  200 mg Oral Daily   apixaban  5 mg Oral BID   ascorbic acid  500 mg Oral BID   aspirin EC  81 mg Oral Daily   budesonide  0.5 mg Nebulization BID  cyanocobalamin  5,000 mcg Oral Daily   ezetimibe  10 mg Oral Daily   ferrous sulfate  325 mg Oral Q breakfast   finasteride  5 mg Oral Daily   gabapentin  300 mg Oral QHS   insulin aspart  0-20 Units Subcutaneous TID WC   insulin aspart  14 Units Subcutaneous TID WC   insulin glargine-yfgn  28 Units Subcutaneous Daily   isosorbide mononitrate  60 mg Oral Daily   levothyroxine  25 mcg Oral Q0600   losartan  25 mg Oral Daily   multivitamin with minerals  1 tablet Oral Daily   nutrition supplement (JUVEN)  1 packet Oral BID BM   pramipexole  2 mg Oral QHS   primidone  250 mg Oral BID   Ensure Max Protein  11 oz Oral QHS   risperiDONE  0.5 mg Oral BID   tamsulosin  0.4 mg Oral Daily   PRN Medications:  acetaminophen **OR** acetaminophen, albuterol, HYDROcodone-acetaminophen, ondansetron **OR** ondansetron (ZOFRAN) IV, traZODone  Antimicrobials from admission:  Anti-infectives (From admission, onward)    Start     Dose/Rate Route Frequency Ordered Stop   05/24/22 1000  vancomycin (VANCOREADY) IVPB 1500 mg/300 mL        1,500  mg 150 mL/hr over 120 Minutes Intravenous 120 min pre-op 05/23/22 1830 05/25/22 1459   05/21/22 1655  ciprofloxacin (CIPRO) IVPB  Status:  Discontinued        over 60 Minutes  Continuous PRN 05/21/22 1655 05/21/22 1737   05/21/22 1515  ciprofloxacin (CIPRO) IVPB 400 mg  Status:  Discontinued        400 mg 200 mL/hr over 60 Minutes Intravenous 60 min pre-op 05/21/22 1515 05/21/22 1736   05/19/22 1200  vancomycin (VANCOREADY) IVPB 1250 mg/250 mL  Status:  Discontinued        1,250 mg 166.7 mL/hr over 90 Minutes Intravenous Every 12 hours 05/19/22 0739 05/25/22 1511   05/19/22 0100  vancomycin (VANCOREADY) IVPB 500 mg/100 mL       See Hyperspace for full Linked Orders Report.   500 mg 100 mL/hr over 60 Minutes Intravenous  Once 05/18/22 2209 05/19/22 0241   05/18/22 2300  vancomycin (VANCOREADY) IVPB 2000 mg/400 mL       See Hyperspace for full Linked Orders Report.   2,000 mg 200 mL/hr over 120 Minutes Intravenous  Once 05/18/22 2209 05/19/22 0138   05/18/22 2300  ceFEPIme (MAXIPIME) 2 g in sodium chloride 0.9 % 100 mL IVPB  Status:  Discontinued        2 g 200 mL/hr over 30 Minutes Intravenous Every 8 hours 05/18/22 2209 05/25/22 1511   05/18/22 2210  vancomycin variable dose per unstable renal function (pharmacist dosing)  Status:  Discontinued         Does not apply See admin instructions 05/18/22 2211 05/20/22 0908   05/18/22 2145  metroNIDAZOLE (FLAGYL) tablet 500 mg        500 mg Oral Every 12 hours 05/18/22 2116 05/25/22 2144   05/18/22 2000  metroNIDAZOLE (FLAGYL) IVPB 500 mg  Status:  Discontinued        500 mg 100 mL/hr over 60 Minutes Intravenous  Once 05/18/22 1948 05/18/22 2144   05/18/22 2000  ceFEPIme (MAXIPIME) 2 g in sodium chloride 0.9 % 100 mL IVPB  Status:  Discontinued        2 g 200 mL/hr over 30 Minutes Intravenous  Once 05/18/22 1959 05/18/22 2209   05/18/22 2000  vancomycin (  VANCOCIN) IVPB 1000 mg/200 mL premix  Status:  Discontinued        1,000 mg 200 mL/hr  over 60 Minutes Intravenous  Once 05/18/22 1959 05/18/22 2209      Data Reviewed:  I have personally reviewed the following.  CBC: Recent Labs  Lab 05/30/22 0621 05/31/22 0626 06/04/22 0343 06/05/22 0321  WBC 12.1* 11.0* 11.0* 10.9*  HGB 8.6* 8.6* 8.5* 8.4*  HCT 27.4* 27.7* 27.6* 27.4*  MCV 89.8 91.1 92.6 92.6  PLT 392 386 357 938    Basic Metabolic Panel: Recent Labs  Lab 05/30/22 0621 06/04/22 0343 06/05/22 0321  NA 136 136 132*  K 3.9 4.5 4.5  CL 107 103 100  CO2 '25 26 27  '$ GLUCOSE 125* 120* 223*  BUN 25* 35* 35*  CREATININE 0.66 0.74 0.79  CALCIUM 8.6* 8.9 8.5*    CBG: Recent Labs  Lab 06/04/22 1216 06/04/22 1641 06/04/22 2037 06/05/22 0819 06/05/22 1129  GLUCAP 248* 180* 196* 165* 205*    Most Recent Urinalysis On File:     Component Value Date/Time   COLORURINE YELLOW (A) 05/20/2022 0134   APPEARANCEUR CLEAR (A) 05/20/2022 0134   LABSPEC 1.013 05/20/2022 0134   PHURINE 6.0 05/20/2022 0134   GLUCOSEU 50 (A) 05/20/2022 0134   HGBUR NEGATIVE 05/20/2022 0134   BILIRUBINUR NEGATIVE 05/20/2022 0134   KETONESUR NEGATIVE 05/20/2022 0134   PROTEINUR 100 (A) 05/20/2022 0134   NITRITE NEGATIVE 05/20/2022 0134   LEUKOCYTESUR NEGATIVE 05/20/2022 0134     LOS: 5 days     Desma Maxim, MD [facility] 06/05/2022, 12:47 PM    If 7PM-7AM, please contact night coverage [email or web address]

## 2022-06-06 DIAGNOSIS — I5032 Chronic diastolic (congestive) heart failure: Secondary | ICD-10-CM

## 2022-06-06 DIAGNOSIS — S81809A Unspecified open wound, unspecified lower leg, initial encounter: Secondary | ICD-10-CM | POA: Diagnosis not present

## 2022-06-06 DIAGNOSIS — M86171 Other acute osteomyelitis, right ankle and foot: Secondary | ICD-10-CM

## 2022-06-06 LAB — GLUCOSE, CAPILLARY
Glucose-Capillary: 142 mg/dL — ABNORMAL HIGH (ref 70–99)
Glucose-Capillary: 223 mg/dL — ABNORMAL HIGH (ref 70–99)
Glucose-Capillary: 226 mg/dL — ABNORMAL HIGH (ref 70–99)
Glucose-Capillary: 164 mg/dL — ABNORMAL HIGH (ref 70–99)

## 2022-06-06 LAB — BASIC METABOLIC PANEL
Anion gap: 6 (ref 5–15)
BUN: 49 mg/dL — ABNORMAL HIGH (ref 8–23)
CO2: 26 mmol/L (ref 22–32)
Calcium: 8.8 mg/dL — ABNORMAL LOW (ref 8.9–10.3)
Chloride: 103 mmol/L (ref 98–111)
Creatinine, Ser: 0.89 mg/dL (ref 0.61–1.24)
GFR, Estimated: >60 mL/min (ref >60)
Glucose, Bld: 161 mg/dL — ABNORMAL HIGH (ref 70–99)
Potassium: 4.6 mmol/L (ref 3.5–5.1)
Sodium: 135 mmol/L (ref 135–145)

## 2022-06-06 LAB — CBC
HCT: 27.6 % — ABNORMAL LOW (ref 39.0–52.0)
Hemoglobin: 8.5 g/dL — ABNORMAL LOW (ref 13.0–17.0)
MCH: 28.6 pg (ref 26.0–34.0)
MCHC: 30.8 g/dL (ref 30.0–36.0)
MCV: 92.9 fL (ref 80.0–100.0)
Platelets: 310 10*3/uL (ref 150–400)
RBC: 2.97 MIL/uL — ABNORMAL LOW (ref 4.22–5.81)
RDW: 16 % — ABNORMAL HIGH (ref 11.5–15.5)
WBC: 9.8 10*3/uL (ref 4.0–10.5)
nRBC: 0 % (ref 0.0–0.2)

## 2022-06-06 NOTE — Progress Notes (Signed)
Physical Therapy Treatment Patient Details Name: [name]. MRN: [id number] DOB: 1956/04/22 Today's Date: 06/06/2022   History of Present Illness Pt is a 67 y/o M admitted on 05/18/22 with concern for right ankle osteomyelitis after an injury in December 2023. Pt was seen in consultation by podiatry, who recommended continuation of empiric IV antibiotics, underwent angiogram & angioplasty of the right anterior tibial artery with vascular surgery on 01/22. After consideration, opted for & underwent R BKA 01/25 given low likelihood of healing with non-surgical management. PMH: COPD, chronic respiratory failure on home O2, CAD s/p CABG, IDDM2, OSA on bipap at night, BPH HTN, hypothyroidism, paroxysmal a-fib on eliquis & amiodarone, PAD, L BKA    PT Comments    Pt was long sitting in bed watching TV upon arrival. He agrees to exercises in bed but did not want to attempt transfer via sideboard. Fully participated in there ex in bed prior to transfer to recliner via hoyer lift. Pt tolerated exercises well. Rn staff stated confidence with abilities to safely return pt to bed later in the day. Acute PT will continue to follow per current POC until pt D/cs home.     Recommendations for follow up therapy are one component of a multi-disciplinary discharge planning process, led by the attending physician.  Recommendations may be updated based on patient status, additional functional criteria and insurance authorization.  Follow Up Recommendations  Skilled nursing-short term rehab (<3 hours/day)     Assistance Recommended at Discharge Frequent or constant Supervision/Assistance  Patient can return home with the following Two people to help with walking and/or transfers;A little help with bathing/dressing/bathroom;Help with stairs or ramp for entrance;Assist for transportation;Assistance with cooking/housework   Equipment Recommendations  Other (comment) (pt refusing hospital bed due to spouse not  feeling its needed. Hospital bed is recommended.)       Precautions / Restrictions Precautions Precautions: Fall Restrictions Weight Bearing Restrictions: Yes RLE Weight Bearing: Non weight bearing     Mobility  Bed Mobility Overal bed mobility: Needs Assistance Bed Mobility: Rolling Rolling: Supervision  General bed mobility comments: pt was able to roll L and R without physical assistance today. does use bedrails/ bed functions. Rolled to allow author/RN tech to place hoyer sling    Transfers  General transfer comment: hoyer transferred pt to recliner via Engineer, manufacturing. pt did not want to attempt slideboard transfer this date due to fatigue/pain. pt did participate in ther ex in bed prior to transferring to recliner. see exercises list below. Transfer via Lift Equipment:  (hoyer)    Balance Overall balance assessment: Needs assistance Sitting-balance support: Bilateral upper extremity supported Sitting balance-Leahy Scale: Fair   Standing balance comment: unable     Cognition Arousal/Alertness: Awake/alert Behavior During Therapy: WFL for tasks assessed/performed Overall Cognitive Status: Within Functional Limits for tasks assessed    General Comments: pt is A and O x 4        Exercises Amputee Exercises Gluteal Sets: AROM, 10 reps Hip Extension: AROM, 10 reps Hip ABduction/ADduction: AROM, Left, AAROM, Right Straight Leg Raises: AAROM, 5 reps, Right, AROM, Left, 10 reps        Pertinent Vitals/Pain Pain Assessment Pain Assessment: 0-10 Pain Score: 4  Pain Location: R limb with activity Pain Descriptors / Indicators: Discomfort, Aching Pain Intervention(s): Limited activity within patient's tolerance, Monitored during session, Premedicated before session, Repositioned     PT Goals (current goals can now be found in the care plan section) Acute Rehab PT Goals  Patient Stated Goal: go home when I'm ready Progress towards PT goals: Progressing toward goals     Frequency    7X/week      PT Plan Current plan remains appropriate       AM-PAC PT "6 Clicks" Mobility   Outcome Measure  Help needed turning from your back to your side while in a flat bed without using bedrails?: A Little Help needed moving from lying on your back to sitting on the side of a flat bed without using bedrails?: A Lot Help needed moving to and from a bed to a chair (including a wheelchair)?: Total Help needed standing up from a chair using your arms (e.g., wheelchair or bedside chair)?: Total Help needed to walk in hospital room?: Total Help needed climbing 3-5 steps with a railing? : Total 6 Click Score: 9    End of Session   Activity Tolerance: Patient tolerated treatment well Patient left: in chair;with call bell/phone within reach;with chair alarm set;with nursing/sitter in room Nurse Communication: Mobility status PT Visit Diagnosis: Muscle weakness (generalized) (M62.81);Other abnormalities of gait and mobility (R26.89);Pain Pain - Right/Left: Right Pain - part of body: Leg     Time: 9449-6759 PT Time Calculation (min) (ACUTE ONLY): 11 min  Charges:  $Therapeutic Exercise: 8-22 mins                     Julaine Fusi PTA 06/06/22, 4:05 PM

## 2022-06-06 NOTE — Progress Notes (Signed)
Transport EMS here to transport pt. VS documented . All belongings with pt. No signs of distress. No reports of pain

## 2022-06-06 NOTE — Discharge Summary (Signed)
Physician Discharge Summary   Patient: [name]. MRN: [id number] DOB: February 27, 1956  Admit date:     05/18/2022  Discharge date: 06/06/22  Discharge Physician: Sharen Hones   PCP: [name], MD   Recommendations at discharge:   Follow-up with PCP in 1 week. Follow-up with vascular surgeon as scheduled.  Discharge Diagnoses: Principal Problem:   Non-healing wound of lower extremity, initial encounter Active Problems:   Acute osteomyelitis of right foot (HCC)   PAD (peripheral artery disease) (HCC)   S/P BKA (below knee amputation), left (HCC)   AKI (acute kidney injury) ([place])   Hyperkalemia   Chronic anticoagulation   AF (paroxysmal atrial fibrillation) ([place])   Uncontrolled type 2 diabetes mellitus with hypoglycemia, with long-term current use of insulin (HCC)   OSA (obstructive sleep apnea)   Chronic respiratory failure with hypoxia (HCC)   COPD (chronic obstructive pulmonary disease) (HCC)   Chronic diastolic CHF (congestive heart failure) (HCC)   CAD with Hx of CABG   Essential hypertension   BPH (benign prostatic hyperplasia)   S/P amputation   Poorly controlled diabetes mellitus ([place])   Status post below-knee amputation of right lower extremity ([place])  Resolved Problems:   * No resolved hospital problems. [facility] Course: This is a pleasant 67 year old gentleman with a history of COPD, chronic respiratory failure on home O2, CAD status post CABG, insulin-dependent type 2 diabetes, OSA on BiPAP at night, BPH hypertension, hypothyroidism and paroxysmal atrial fibrillation on Eliquis and amiodarone who has peripheral arterial disease status post left BKA and is admitted to the hospital with concern for right ankle osteomyelitis after an injury in December 2023.  He was seen in consultation by podiatry, who recommends continuation of empiric IV antibiotics, underwent angiogram and angioplasty of the right anterior tibial artery with vascular surgery on  01/22. After consideration, opted for R BKA 01/24 given low likelihood of healing with non-surgical management. Awaiting SNF placement. Stopped abx 01/26 Patient does not have resources to pay for SNF rehab.  As result, he was discharged to home with home care.  But patient has been appealing the discharge. Appeal for staying in hospital was denied by insurance company twice, ran out of options.  He will have to leave the hospital today. Assessment and Plan:  Acute osteomyelitis of right foot ([place]) Nonhealing wound right ankle PAD s/p left BKA Condition has improved, patient is status positive BKA.  Antibiotics completed.   Hyperkalemia Acute kidney injury. Condition had improved.   Uncontrolled type 2 diabetes mellitus with hypoglycemia, with long-term current use of insulin (HCC) Continuehome regimen.   AF (paroxysmal atrial fibrillation) (HCC) Continue anticoagulation and amiodarone.  Heart rate controlled.   COPD (chronic obstructive pulmonary disease) (HCC) OSA on home BiPAP Chronic respiratory failure on home O2 Stable.   CAD with Hx of CABG Chronic diastolic CHF (congestive heart failure) ([place]) Essential hypertension Continue home treatment.   BPH (benign prostatic hyperplasia) Continue finasteride and tamsulosin        Consultants: Vascular Procedures performed: BKA  Disposition: Home Diet recommendation:  Discharge Diet Orders (From admission, onward)     Start     Ordered   06/02/22 0000  Diet - low sodium heart healthy        06/02/22 1030   05/29/22 0000  Diet - low sodium heart healthy        05/29/22 1856           Cardiac diet DISCHARGE MEDICATION: Allergies as  of 06/06/2022       Reactions   Penicillins Other (See Comments)   Happened at 67 years old and pt. stated he passed out  He has tolerated amoxicillin/clavulanate and ampicillin/sulbactam   Statins    Other reaction(s): Muscle Pain Causes legs to ache per pt   Metformin And Related  Diarrhea        Medication List     STOP taking these medications    diltiazem 300 MG 24 hr capsule Commonly known as: TIAZAC   insulin glargine-yfgn 100 UNIT/ML injection Commonly known as: SEMGLEE   traMADol 50 MG tablet Commonly known as: ULTRAM       TAKE these medications    acetaminophen 325 MG tablet Commonly known as: TYLENOL Take 2 tablets (650 mg total) by mouth every 6 (six) hours as needed for mild pain (or Fever >/= 101). What changed: when to take this Notes to patient: No   amiodarone 200 MG tablet Commonly known as: PACERONE Take 200 mg by mouth daily.   ascorbic acid 500 MG tablet Commonly known as: VITAMIN C Take 500 mg by mouth daily.   aspirin EC 81 MG tablet Take 81 mg by mouth daily. Swallow whole.   budesonide 0.5 MG/2ML nebulizer solution Commonly known as: PULMICORT Take 2 mLs (0.5 mg total) by nebulization 2 (two) times daily.   Cholecalciferol 25 MCG (1000 UT) tablet Take 1,000 Units by mouth daily.   Dermacloud Oint Apply 1 Application topically daily as needed.   Eliquis 5 MG Tabs tablet Generic drug: apixaban Take 5 mg by mouth 2 (two) times daily.   ezetimibe 10 MG tablet Commonly known as: ZETIA Take 10 mg by mouth at bedtime.   ferrous sulfate 325 (65 FE) MG tablet Take 325 mg by mouth daily with breakfast.   finasteride 5 MG tablet Commonly known as: PROSCAR Take 1 tablet (5 mg total) by mouth daily.   furosemide 20 MG tablet Commonly known as: LASIX Take 1 tablet (20 mg total) by mouth ONCE OR TWICE daily (total daily dose maximum 40 mg) as needed for increased swelling, shortness of breath, weight gain 5+ lbs over 1-2 days. Seek medical care if these symptoms are not improving with increased dose, or if you become dizzy or have low blood pressure. What changed:  how much to take how to take this when to take this additional instructions   gabapentin 300 MG capsule Commonly known as: NEURONTIN Take 300 mg  by mouth at bedtime.   HumaLOG 100 UNIT/ML injection Generic drug: insulin lispro Inject 15 Units into the skin 3 (three) times daily with meals. >200   insulin glargine 100 UNIT/ML injection Commonly known as: LANTUS Inject 15 units into the skin daily in AM and inject 25 units into the skin daily in PM What changed:  how much to take how to take this additional instructions   isosorbide mononitrate 60 MG 24 hr tablet Commonly known as: IMDUR Take 60 mg by mouth in the morning and at bedtime.   levothyroxine 25 MCG tablet Commonly known as: SYNTHROID Take 1 tablet (25 mcg total) by mouth daily at 6 (six) AM.   losartan 25 MG tablet Commonly known as: COZAAR Take 1 tablet (25 mg total) by mouth daily. What changed:  medication strength how much to take   multivitamin with minerals tablet Take 1 tablet by mouth daily.   nitrofurantoin (macrocrystal-monohydrate) 100 MG capsule Commonly known as: MACROBID Take 100 mg by mouth daily.  nitroGLYCERIN 0.4 MG SL tablet Commonly known as: NITROSTAT Place 0.4 mg under the tongue every 5 (five) minutes as needed for chest pain.   nutrition supplement (JUVEN) Pack Take 1 packet by mouth 2 (two) times daily between meals.   Ensure Max Protein Liqd Take 330 mLs (11 oz total) by mouth at bedtime.   nystatin powder Commonly known as: MYCOSTATIN/NYSTOP Apply 1 Application topically 2 (two) times daily.   ondansetron 4 MG tablet Commonly known as: ZOFRAN Take 1 tablet (4 mg total) by mouth every 6 (six) hours as needed for nausea.   pramipexole 1 MG tablet Commonly known as: MIRAPEX Take 2 mg by mouth at bedtime.   primidone 250 MG tablet Commonly known as: MYSOLINE Take 250 mg by mouth 2 (two) times daily.   risperiDONE 0.5 MG tablet Commonly known as: RISPERDAL Take 0.5 tablets (0.25 mg total) by mouth 2 (two) times daily. What changed: how much to take   tamsulosin 0.4 MG Caps capsule Commonly known as:  FLOMAX Take 0.4 mg by mouth daily.   Vitamin B-12 5000 MCG Tbdp Take 10,000 mcg by mouth daily.   zinc gluconate 50 MG tablet Take 50 mg by mouth daily.   zinc sulfate 220 (50 Zn) MG capsule Take 1 capsule (220 mg total) by mouth daily.       ASK your doctor about these medications    HYDROcodone-acetaminophen 5-325 MG tablet Commonly known as: NORCO/VICODIN Take 1-2 tablets by mouth every 6 (six) hours as needed for up to 5 days for severe pain. Ask about: Should I take this medication?               Durable Medical Equipment  (From admission, onward)           Start     Ordered   06/05/22 0841  For home use only DME Hospital bed  Once       Question Answer Comment  Length of Need 12 Months   Patient has (list medical condition): BKA, Bed bound, Obese   The above medical condition requires: Patient requires the ability to reposition frequently   Bed type Semi-electric   Hoyer Lift Yes   Support Surface: Gel Overlay      06/05/22 0840              Discharge Care Instructions  (From admission, onward)           Start     Ordered   06/02/22 0000  Leave dressing on - Keep it clean, dry, and intact until clinic visit        06/02/22 1030   05/29/22 0000  Discharge wound care:       Comments: Keep incision clean and dry. Follow as directed with your surgeon.   05/29/22 1856            Follow-up Information     Algernon Huxley, MD Follow up on 06/19/2022.   Specialties: Vascular Surgery, Radiology, Interventional Cardiology Why: at 130 Contact information: [place]  [id number] [phone]                Discharge Exam: Danley Danker Weights   05/18/22 2150  Weight: 135.5 kg   General exam: Appears calm and comfortable  Respiratory system: Clear to auscultation. Respiratory effort normal. Cardiovascular system: S1 & S2 heard, RRR. No JVD, murmurs, rubs, gallops or clicks. No pedal edema. Gastrointestinal  system: Abdomen is nondistended, soft and nontender. No  organomegaly or masses felt. Normal bowel sounds heard. Central nervous system: Alert and oriented. No focal neurological deficits. Extremities: Bilateral BKA Skin: No rashes, lesions or ulcers Psychiatry: Judgement and insight appear normal. Mood & affect appropriate.    Condition at discharge: good  The results of significant diagnostics from this hospitalization (including imaging, microbiology, ancillary and laboratory) are listed below for reference.   Imaging Studies: PERIPHERAL VASCULAR CATHETERIZATION  Result Date: 05/21/2022 See surgical note for result.  MR ANKLE RIGHT WO CONTRAST  Result Date: 05/18/2022 CLINICAL DATA:  Ankle pain, chronic, inflammatory arthritis suspected. Patient sent due to bone infection in right lateral malleolus. EXAM: MRI OF THE RIGHT ANKLE WITHOUT CONTRAST TECHNIQUE: Multiplanar, multisequence MR imaging of the ankle was performed. No intravenous contrast was administered. COMPARISON:  Radiographs dated February 11, 2022 FINDINGS: TENDONS Peroneal: Peroneal longus tendon intact. Peroneal brevis intact. Edema along the peroneus brevis and peroneus longus tendons consistent with tenosynovitis. Posteromedial: Posterior tibial tendon intact. Flexor hallucis longus tendon intact. Flexor digitorum longus tendon intact. Anterior: Tibialis anterior tendon intact. Extensor hallucis longus tendon intact Extensor digitorum longus tendon intact. Achilles: Intermediate intrasubstance signal of the distal Achilles tendon suggesting tendinosis without tear. Plantar Fascia: Intact. LIGAMENTS Lateral: Anterior talofibular ligament intact. Calcaneofibular ligament intact. Posterior talofibular ligament intact. Anterior and posterior tibiofibular ligaments intact. Medial: Deltoid ligament intact. Spring ligament intact. CARTILAGE Ankle Joint: Small joint effusion. Normal ankle mortise. No chondral defect. Subtalar Joints/Sinus  Tarsi: Degenerative changes with cartilage defect and subchondral cyst of the subtalar joint in the body of the talus. Normal sinus tarsi. Bones: Bone marrow edema of the lateral malleolus adjacent to the deep skin wound consistent with osteomyelitis. Soft Tissue: Generalized subcutaneous soft tissue edema about the medial and lateral aspect of the ankle/foot no drainable fluid collection or hematoma. Increased intra substance signal of the plantar muscles suggesting myopathy/myositis. Tarsal tunnel is normal. IMPRESSION: 1. Bone marrow edema of the lateral malleolus adjacent to the deep skin wound consistent with osteomyelitis. 2. Tenosynovitis of the peroneus brevis and peroneus longus tendons without evidence of tendon tear. 3. Generalized subcutaneous soft tissue edema about the medial and lateral aspect of the ankle/foot without drainable fluid collection or hematoma. 4. Increased intra substance signal of the plantar muscles suggesting myopathy/myositis. Electronically Signed   By: Keane Police D.O.   On: 05/18/2022 21:58   DG Chest 2 View  Result Date: 05/18/2022 CLINICAL DATA:  Suspected sepsis. EXAM: CHEST - 2 VIEW COMPARISON:  03/27/2022. FINDINGS: Unchanged fracture of the uppermost median sternotomy wire. Remaining wires appear intact. No focal airspace opacity. Stable postoperative changes of CABG. Normal heart size. Stable mediastinal contours with atherosclerotic calcifications and tortuosity of the aorta. No pleural effusion or pneumothorax. Visualized bones and upper abdomen are unremarkable. IMPRESSION: No acute cardiopulmonary disease. Electronically Signed   By: Emmit Alexanders M.D.   On: 05/18/2022 16:05    Microbiology: Results for orders placed or performed during the hospital encounter of 05/18/22  Culture, blood (Routine x 2)     Status: None   Collection Time: 05/18/22  3:27 PM   Specimen: BLOOD  Result Value Ref Range Status   Specimen Description BLOOD RIGHT [facility]  Final    Special Requests   Final    BOTTLES DRAWN AEROBIC AND ANAEROBIC Blood Culture adequate volume   Culture   Final    NO GROWTH 5 DAYS Performed at [facility], [address] [place], [place] [address]    Report Status 05/23/2022 FINAL  Final  Culture, blood (Routine X 2) w Reflex to ID Panel     Status: None   Collection Time: 05/18/22 10:10 PM   Specimen: BLOOD  Result Value Ref Range Status   Specimen Description BLOOD BLOOD LEFT ARM  Final   Special Requests   Final    BOTTLES DRAWN AEROBIC AND ANAEROBIC Blood Culture adequate volume   Culture   Final    NO GROWTH 5 DAYS Performed at [facility], [address] [place], [place] [address]    Report Status 05/23/2022 FINAL  Final    Labs: CBC: Recent Labs  Lab 05/31/22 0626 06/04/22 0343 06/05/22 0321 06/06/22 0259  WBC 11.0* 11.0* 10.9* 9.8  HGB 8.6* 8.5* 8.4* 8.5*  HCT 27.7* 27.6* 27.4* 27.6*  MCV 91.1 92.6 92.6 92.9  PLT 386 357 349 756   Basic Metabolic Panel: Recent Labs  Lab 06/04/22 0343 06/05/22 0321 06/06/22 0259  NA 136 132* 135  K 4.5 4.5 4.6  CL 103 100 103  CO2 '26 27 26  '$ GLUCOSE 120* 223* 161*  BUN 35* 35* 49*  CREATININE 0.74 0.79 0.89  CALCIUM 8.9 8.5* 8.8*   Liver Function Tests: No results for input(s): "AST", "ALT", "ALKPHOS", "BILITOT", "PROT", "ALBUMIN" in the last 168 hours. CBG: Recent Labs  Lab 06/05/22 2120 06/06/22 0809 06/06/22 0949 06/06/22 1140 06/06/22 1654  GLUCAP 153* 142* 223* 226* 164*    Discharge time spent: greater than 30 minutes.  Signed: Sharen Hones, MD [facility] 06/06/2022

## 2022-06-06 NOTE — Progress Notes (Signed)
Progress Note   Patient: [name]. XFG:[id number] DOB: 1955-05-13 DOA: 05/18/2022     19 DOS: the patient was seen and examined on 06/06/2022   Brief hospital course: This is a pleasant 67 year old gentleman with a history of COPD, chronic respiratory failure on home O2, CAD status post CABG, insulin-dependent type 2 diabetes, OSA on BiPAP at night, BPH hypertension, hypothyroidism and paroxysmal atrial fibrillation on Eliquis and amiodarone who has peripheral arterial disease status post left BKA and is admitted to the hospital with concern for right ankle osteomyelitis after an injury in December 2023.  He was seen in consultation by podiatry, who recommends continuation of empiric IV antibiotics, underwent angiogram and angioplasty of the right anterior tibial artery with vascular surgery on 01/22. After consideration, opted for R BKA 01/24 given low likelihood of healing with non-surgical management. Awaiting SNF placement. Stopped abx 01/26 Patient does not have resources to pay for SNF rehab.  As result, he was discharged to home with home care.  But patient has been appealing the discharge.   Principal Problem:   Non-healing wound of lower extremity, initial encounter Active Problems:   Acute osteomyelitis of right foot (HCC)   PAD (peripheral artery disease) (HCC)   S/P BKA (below knee amputation), left (HCC)   AKI (acute kidney injury) ([place])   Hyperkalemia   Chronic anticoagulation   AF (paroxysmal atrial fibrillation) ([place])   Uncontrolled type 2 diabetes mellitus with hypoglycemia, with long-term current use of insulin (HCC)   OSA (obstructive sleep apnea)   Chronic respiratory failure with hypoxia (HCC)   COPD (chronic obstructive pulmonary disease) (HCC)   Chronic diastolic CHF (congestive heart failure) (HCC)   CAD with Hx of CABG   Essential hypertension   BPH (benign prostatic hyperplasia)   S/P amputation   Poorly controlled diabetes mellitus ([place])   Status  post below-knee amputation of right lower extremity (HCC)   Assessment and Plan: Acute osteomyelitis of right foot ([place]) Nonhealing wound right ankle PAD s/p left BKA Condition has improved, patient is status positive BKA.  Antibiotics completed.  Hyperkalemia Acute kidney injury. Condition had improved.  Uncontrolled type 2 diabetes mellitus with hypoglycemia, with long-term current use of insulin (HCC) Continue current regimen.  AF (paroxysmal atrial fibrillation) (HCC) Continue anticoagulation and amiodarone.  Heart rate controlled.  COPD (chronic obstructive pulmonary disease) (HCC) OSA on home BiPAP Chronic respiratory failure on home O2 Stable.  CAD with Hx of CABG Chronic diastolic CHF (congestive heart failure) ([place]) Essential hypertension Continue current treatment.  BPH (benign prostatic hyperplasia) Continue finasteride and tamsulosin       Subjective:  Patient doing well, no new issues.  Pending appeal for discharge.  Physical Exam: Vitals:   06/05/22 1637 06/06/22 0032 06/06/22 0809 06/06/22 0810  BP: 113/63 133/64  (!) 148/80  Pulse: 83 86  82  Resp: '16 18  16  '$ Temp: 98.1 F (36.7 C) 98.4 F (36.9 C)  98.2 F (36.8 C)  TempSrc:    Oral  SpO2: 96% 97% 99% 100%  Weight:      Height:       General exam: Appears calm and comfortable  Respiratory system: Clear to auscultation. Respiratory effort normal. Cardiovascular system: S1 & S2 heard, RRR. No JVD, murmurs, rubs, gallops or clicks. No pedal edema. Gastrointestinal system: Abdomen is nondistended, soft and nontender. No organomegaly or masses felt. Normal bowel sounds heard. Central nervous system: Alert and oriented. No focal neurological deficits. Extremities: SP bilateral BKA. Skin:  No rashes, lesions or ulcers Psychiatry: Judgement and insight appear normal. Mood & affect appropriate. '  Data Reviewed:  There are no new results to review at this time.  Family Communication:    Disposition: Status is: Inpatient Remains inpatient appropriate because: Unsafe discharge option.     Time spent: 35 minutes  Author: Sharen Hones, MD 06/06/2022 2:52 PM  For on call review [email or web address].

## 2022-06-06 NOTE — Progress Notes (Signed)
Progress Note    06/06/2022 9:59 AM 13 Days Post-Op  Subjective:  [name] is a 67 yo male who is S/P 05/21/22 from right lower extremity angiogram and Patient is postop day 13 following a right below-knee amputation.  Several years ago he also underwent a left below-knee amputation.  He notes that he has been up out of bed to the chair with physical therapy today.  He endorses having good pain control with prescribed medications.  No complaints overnight. Vital all remain stable.     Vitals:   06/06/22 0809 06/06/22 0810  BP:  (!) 148/80  Pulse:  82  Resp:  16  Temp:  98.2 F (36.8 C)  SpO2: 99% 100%   Physical Exam: Cardiac:  RRR Lungs:  Clear throughout with normal effort.  Incisions:  Right stump staples, C/D/I.  Extremities:  Bilateral BKA's Healing well. Staples in place without drainage today.  Abdomen:   Positive Bowel sounds, soft NT/ND    Neurologic:  AAOX3  Normal Affect and answerers all questions appropriately.   CBC    Component Value Date/Time   WBC 9.8 06/06/2022 0259   RBC 2.97 (L) 06/06/2022 0259   HGB 8.5 (L) 06/06/2022 0259   HGB 10.7 (L) 05/15/2022 1440   HCT 27.6 (L) 06/06/2022 0259   HCT 33.4 (L) 05/15/2022 1440   PLT 310 06/06/2022 0259   PLT 394 05/15/2022 1440   MCV 92.9 06/06/2022 0259   MCV 89 05/15/2022 1440   MCV 96 07/12/2011 0919   MCH 28.6 06/06/2022 0259   MCHC 30.8 06/06/2022 0259   RDW 16.0 (H) 06/06/2022 0259   RDW 12.5 05/15/2022 1440   RDW 15.2 (H) 07/12/2011 0919   LYMPHSABS 1.0 05/18/2022 1527   LYMPHSABS 1.2 07/12/2011 0919   MONOABS 0.6 05/18/2022 1527   MONOABS 0.8 (H) 07/12/2011 0919   EOSABS 0.2 05/18/2022 1527   EOSABS 0.4 07/12/2011 0919   BASOSABS 0.1 05/18/2022 1527   BASOSABS 0.1 07/12/2011 0919    BMET    Component Value Date/Time   NA 135 06/06/2022 0259   NA 141 07/12/2011 0919   K 4.6 06/06/2022 0259   K 4.4 07/12/2011 0919   CL 103 06/06/2022 0259   CL 102 07/12/2011 0919   CO2 26  06/06/2022 0259   CO2 29 07/12/2011 0919   GLUCOSE 161 (H) 06/06/2022 0259   GLUCOSE 76 07/12/2011 0919   BUN 49 (H) 06/06/2022 0259   BUN 16 05/05/2013 1022   CREATININE 0.89 06/06/2022 0259   CREATININE 0.71 05/05/2013 1022   CALCIUM 8.8 (L) 06/06/2022 0259   CALCIUM 8.8 07/12/2011 0919   GFRNONAA >60 06/06/2022 0259   GFRNONAA >60 05/05/2013 1022   GFRAA >60 01/06/2020 1853   GFRAA >60 05/05/2013 1022    INR    Component Value Date/Time   INR 1.3 (H) 05/18/2022 1527     Intake/Output Summary (Last 24 hours) at 06/06/2022 0959 Last data filed at 06/06/2022 0900 Gross per 24 hour  Intake 350 ml  Output 3300 ml  Net -2950 ml     Assessment/Plan:  67 y.o. male is s/p Right lower extremity angiogram and Right below the knee amputation   13 Days Post-Op   PLAN:  Removed 16 staples in incision line of right BKA this morning. No complications or signs of drainage or infection to note. I left behind 14 staples across the incision line. Steri Strips were placed along the incision line between the remaining staples. Rewrapped right  lower extremity with Ace Bandage to help control swelling and start formation of stump. Instructed patient he needs to have the ace wrap on every day or this could compromise his ability to get a prosthetic.   Patient will continue to follow up in clinic as already scheduled after discharge from the hospital to have the remaining staples removed.   DVT prophylaxis:  Lovenox   Drema Pry [facility] 06/06/2022 9:59 AM

## 2022-06-06 NOTE — Plan of Care (Signed)
  Problem: Skin Integrity: Goal: Risk for impaired skin integrity will decrease Outcome: Progressing   Problem: Nutritional: Goal: Maintenance of adequate nutrition will improve Outcome: Progressing   Problem: Education: Goal: Ability to describe self-care measures that may prevent or decrease complications (Diabetes Survival Skills Education) will improve Outcome: Progressing    Problem: Pain Managment: Goal: General experience of comfort will improve Outcome: Progressing   Problem: Coping: Goal: Level of anxiety will decrease Outcome: Progressing

## 2022-06-06 NOTE — TOC Progression Note (Signed)
Transition of Care (TOC) - Progression Note    Patient Details  Name: [name]. MRN: [id number] Date of Birth: 02-02-1956  Transition of Care [facility]) CM/SW [place], RN Phone Number: 06/06/2022, 10:16 AM  Clinical Narrative:    The reconsideration is still pending   Expected Discharge Plan: [place] Barriers to Discharge: Continued Medical Work up  Expected Discharge Plan and Services   Discharge Planning Services: CM Consult   Living arrangements for the past 2 months: Single Family Home Expected Discharge Date: 06/02/22                 DME Agency: [employer]       HH Arranged: RN [place] Agency: [facility] Date [place] Agency Contacted: 05/30/22 Time [place]: 1110 Representative spoke with at [place]: [place] ([place]) Interventions SDOH Screenings   Food Insecurity: No Food Insecurity (05/19/2022)  Housing: Low Risk  (05/19/2022)  Transportation Needs: No Transportation Needs (05/19/2022)  Recent Concern: Transportation Needs - Unmet Transportation Needs (03/21/2022)  Utilities: Not At Risk (05/19/2022)  Depression (PHQ2-9): Low Risk  (11/21/2021)  Tobacco Use: Medium Risk (05/25/2022)    Readmission Risk Interventions    02/13/2022    4:57 PM 09/29/2021   11:12 AM  Readmission Risk Prevention Plan  Transportation Screening Complete Complete  PCP or Specialist Appt within 3-5 Days  Complete  HRI or [place]  Complete  Social Work Consult for [place] Planning/Counseling  [place]  Not Applicable  Medication Review Press photographer) Complete Complete  PCP or Specialist appointment within 3-5 days of discharge Complete   HRI or [place] Complete   SW Recovery Care/Counseling Consult Complete   [place] Not Applicable

## 2022-06-06 NOTE — Progress Notes (Signed)
Nutrition Follow-up  DOCUMENTATION CODES:   Obesity unspecified  INTERVENTION:   -Continue MVI with minerals daily -Continue 500 mg vitamin C BID -Continue 220 mg zinc sulfate daily x 14 days -Continue 1 packet Juven BID, each packet provides 95 calories, 2.5 grams of protein (collagen), and 9.8 grams of carbohydrate (3 grams sugar); also contains 7 grams of L-arginine and L-glutamine, 300 mg vitamin C, 15 mg vitamin E, 1.2 mcg vitamin B-12, 9.5 mg zinc, 200 mg calcium, and 1.5 g  Calcium Beta-hydroxy-Beta-methylbutyrate to support wound healing  -Continue double protein portions with meals -Continue Ensure Max po daily, each supplement provides 150 kcal and 30 grams of protein  NUTRITION DIAGNOSIS:   Increased nutrient needs related to wound healing as evidenced by estimated needs.  Ongoing  GOAL:   Patient will meet greater than or equal to 90% of their needs  Progressing   MONITOR:   PO intake, Supplement acceptance, Skin, Labs  REASON FOR ASSESSMENT:   Consult Wound healing  ASSESSMENT:   Pt with hx of CHF, COPD, CAD, DM type 2, gout, HLD, HTN, hx MI, PVD and left BKA presented to ED with worsening ankle wound. Found to have osteomyelitis.  1/22- s/p aortogram  1/25- rt BKA  Reviewed I/O's: -1.5 L x 24 hours and -27 L since 05/23/22  UOP: 2.1 L x 24 hours   Pt remains with good appetite. Noted meal completions 100%. Pt is also consuming supplements per MAR.   Pt with active discharge order, however, pt's wife is appealing discharge; appeal currently in progress.   Medications reviewed and include vitamin C, vitamin B-12, and ferrous sulfate.   Labs reviewed: CBGS: 142-154 (inpatient orders for glycemic control are 0-20 units insulin aspart TID with meals, 16 units insulin aspart TID with meals, and 30 units insulin glargine-yfgn daily).    Diet Order:   Diet Order             Diet - low sodium heart healthy           Diet - low sodium heart healthy            Diet Carb Modified Fluid consistency: Thin; Room service appropriate? Yes  Diet effective now                   EDUCATION NEEDS:   Education needs have been addressed  Skin:  Skin Assessment: Skin Integrity Issues: Skin Integrity Issues:: Stage I, Incisions Stage I: perineum Stage III: - Incisions: s/p rt BKA  Last BM:  06/05/22 (type 6)  Height:   Ht Readings from Last 1 Encounters:  05/21/22 '6\' 4"'$  (1.93 m)    Weight:   Wt Readings from Last 1 Encounters:  05/18/22 135.5 kg    Ideal Body Weight:  79.9 kg (adjusted for bilateral BKA)  BMI:  Body mass index is 36.37 kg/m.  Estimated Nutritional Needs:   Kcal:  1962-2297  Protein:  120-135 grams  Fluid:  > 2 L    Loistine Chance, RD, LDN, [place] Registered Dietitian II Certified Diabetes Care and Education Specialist Please refer to [facility] for RD and/or RD on-call/weekend/after hours pager

## 2022-06-06 NOTE — TOC Progression Note (Signed)
Transition of Care (TOC) - Progression Note    Patient Details  Name: [name]. MRN: [id number] Date of Birth: 08-23-1955  Transition of Care [facility]) CM/SW [place], RN Phone Number: 06/06/2022, 4:38 PM  Clinical Narrative:     Spoke with the patient's [name].  I explained to her that [name] has came back and they agree with the Discharge, She stated that noone cares that she is sick.  I explained that the criteria for a patient to be in the hospital is based on their medical condition, I explained we would send him home with EMS to transport,  She said "fine, send him on"  Expected Discharge Plan: [place] Barriers to Discharge: Continued Medical Work up  Expected Discharge Plan and Services   Discharge Planning Services: CM Consult   Living arrangements for the past 2 months: Single Family Home Expected Discharge Date: 06/02/22                 DME Agency: [employer]       HH Arranged: RN [place] Agency: [facility] Date [place]: 05/30/22 Time [place]: 1110 Representative spoke with at [place]: [place] ([place]) Interventions SDOH Screenings   Food Insecurity: No Food Insecurity (05/19/2022)  Housing: Low Risk  (05/19/2022)  Transportation Needs: No Transportation Needs (05/19/2022)  Recent Concern: Transportation Needs - Unmet Transportation Needs (03/21/2022)  Utilities: Not At Risk (05/19/2022)  Depression (PHQ2-9): Low Risk  (11/21/2021)  Tobacco Use: Medium Risk (05/25/2022)    Readmission Risk Interventions    02/13/2022    4:57 PM 09/29/2021   11:12 AM  Readmission Risk Prevention Plan  Transportation Screening Complete Complete  PCP or Specialist Appt within 3-5 Days  Complete  HRI or [place]  Complete  Social Work Consult for [place] Planning/Counseling  [place] Screening  Not Applicable  Medication Review Human resources officer) Complete Complete  PCP or Specialist appointment within 3-5 days of discharge Complete   HRI or [place] Complete   SW Recovery Care/Counseling Consult Complete   [place] Not Applicable

## 2022-06-08 DIAGNOSIS — Z4781 Encounter for orthopedic aftercare following surgical amputation: Secondary | ICD-10-CM | POA: Diagnosis not present

## 2022-06-08 DIAGNOSIS — J9601 Acute respiratory failure with hypoxia: Secondary | ICD-10-CM | POA: Diagnosis not present

## 2022-06-08 DIAGNOSIS — M6281 Muscle weakness (generalized): Secondary | ICD-10-CM | POA: Diagnosis not present

## 2022-06-14 ENCOUNTER — Telehealth (INDEPENDENT_AMBULATORY_CARE_PROVIDER_SITE_OTHER): Payer: Self-pay | Admitting: Nurse Practitioner

## 2022-06-14 NOTE — Telephone Encounter (Signed)
[facility] called stating that they left a VM yesterday on the nurse's line but hasn't heard back from anyone. States they really need a verbal to do Home Health with pt - states that he has been discharged from the hospital for over a week. States that the patient has a wound that they really need to check on. I advised to leave another message and that I would check with them in a little bit to make sure they received the message and hopefully can get something done today. The nurse stated that they ned to see him tomorrow.

## 2022-06-14 NOTE — Telephone Encounter (Signed)
Can we call and give them a verbal to begin

## 2022-06-14 NOTE — Telephone Encounter (Signed)
[name] from [facility] notified verbal order

## 2022-06-19 ENCOUNTER — Encounter (INDEPENDENT_AMBULATORY_CARE_PROVIDER_SITE_OTHER): Payer: Self-pay | Admitting: Nurse Practitioner

## 2022-06-19 ENCOUNTER — Other Ambulatory Visit: Payer: Self-pay | Admitting: Cardiovascular Disease

## 2022-06-19 ENCOUNTER — Ambulatory Visit (INDEPENDENT_AMBULATORY_CARE_PROVIDER_SITE_OTHER): Payer: Medicare Other | Admitting: Nurse Practitioner

## 2022-06-19 VITALS — BP 109/68 | HR 95 | Resp 16

## 2022-06-19 DIAGNOSIS — Z89511 Acquired absence of right leg below knee: Secondary | ICD-10-CM

## 2022-06-19 MED ORDER — SULFAMETHOXAZOLE-TRIMETHOPRIM 800-160 MG PO TABS: 1.0000 | ORAL_TABLET | Freq: Two times a day (BID) | ORAL | 0 refills | Status: DC

## 2022-06-20 ENCOUNTER — Telehealth: Payer: Self-pay | Admitting: Cardiovascular Disease

## 2022-06-20 ENCOUNTER — Encounter (INDEPENDENT_AMBULATORY_CARE_PROVIDER_SITE_OTHER): Payer: Self-pay | Admitting: Nurse Practitioner

## 2022-06-20 ENCOUNTER — Ambulatory Visit: Payer: Medicare Other | Admitting: Urology

## 2022-06-20 DIAGNOSIS — G4733 Obstructive sleep apnea (adult) (pediatric): Secondary | ICD-10-CM | POA: Diagnosis not present

## 2022-06-20 DIAGNOSIS — I10 Essential (primary) hypertension: Secondary | ICD-10-CM | POA: Diagnosis not present

## 2022-06-20 DIAGNOSIS — M6281 Muscle weakness (generalized): Secondary | ICD-10-CM | POA: Diagnosis not present

## 2022-06-20 DIAGNOSIS — J449 Chronic obstructive pulmonary disease, unspecified: Secondary | ICD-10-CM | POA: Diagnosis not present

## 2022-06-20 MED ORDER — NITROGLYCERIN 0.4 MG SL SUBL: 0.4000 mg | SUBLINGUAL_TABLET | SUBLINGUAL | 3 refills | Status: DC | PRN

## 2022-06-20 MED ORDER — AMIODARONE HCL 200 MG PO TABS: 200.0000 mg | ORAL_TABLET | Freq: Every day | ORAL | 3 refills | Status: DC

## 2022-06-20 MED ORDER — ISOSORBIDE MONONITRATE ER 60 MG PO TB24: 60.0000 mg | ORAL_TABLET | Freq: Two times a day (BID) | ORAL | 3 refills | Status: DC

## 2022-06-20 NOTE — Telephone Encounter (Signed)
Pt's wife stated pt need refills for the following medications as they were previously prescribed by Dr. Ubaldo Glassing.  Amiodarone 200 mg daily Imdur 60 mg BID Nitroglycerin as needed  Diltiazem 300 mg daily   Per chart review, Diltiazem was stopped per discharge summary on 06/06/22. However, wife stated pt saw Dr. Ubaldo Glassing after discharge and was instructed to resume Diltiazem 300 mg daily. No notes available for confirmation.  Will forward to MD for recommendations.

## 2022-06-20 NOTE — Progress Notes (Signed)
Subjective:    Patient ID: Alec Less., male    DOB: 05-26-1955, 67 y.o.   MRN: [id number] Chief Complaint  Patient presents with   Routine Post Op    [facility] 2 week follow up    [name] is a 67 year old male who presents today for follow-up evaluation of his right below-knee amputation.  Some staples removed during his hospitalization.  Wound care has been done by his wife at home.  At initial presentation the wound has some copious foul-smelling drainage.  The wound has some evidence of slight dehiscence.    Review of Systems  Skin:  Positive for wound.  Neurological:  Positive for weakness.  All other systems reviewed and are negative.      Objective:   Physical Exam Vitals reviewed.  HENT:     Head: Normocephalic.  Cardiovascular:     Rate and Rhythm: Normal rate.  Pulmonary:     Effort: Pulmonary effort is normal.  Musculoskeletal:     Right Lower Extremity: Right leg is amputated below knee.     Left Lower Extremity: Left leg is amputated below knee.  Skin:    General: Skin is warm.  Neurological:     Mental Status: He is alert and oriented to person, place, and time.  Psychiatric:        Mood and Affect: Mood normal.        Behavior: Behavior normal.        Thought Content: Thought content normal.        Judgment: Judgment normal.     BP 109/68 (BP Location: Left Arm)   Pulse 95   Resp 16   Past Medical History:  Diagnosis Date   Arrhythmia    atrial fibrillation   Asthma    CHF (congestive heart failure) (HCC)    COPD (chronic obstructive pulmonary disease) (HCC)    Coronary artery disease    Depression    Diabetes mellitus without complication (HCC)    Gout    History anabolic steroid use    Hyperlipidemia    Hypertension    Hypogonadism in male    MI (myocardial infarction) ([place])    Morbid obesity (HCC)    Myocardial infarction ([place])    Peripheral vascular disease (HCC)    Perirectal abscess    Pleurisy    Sleep apnea     CPAP at night, no oxygen   Varicella     Social History   Socioeconomic History   Marital status: Married    Spouse name: Not on file   Number of children: Not on file   Years of education: Not on file   Highest education level: Not on file  Occupational History   Not on file  Tobacco Use   Smoking status: Former    Packs/day: 0.50    Years: 45.00    Total pack years: 22.50    Types: Cigarettes    Quit date: 04/05/2015    Years since quitting: 7.2   Smokeless tobacco: Never  Vaping Use   Vaping Use: Every day  Substance and Sexual Activity   Alcohol use: Not Currently    Alcohol/week: 3.0 standard drinks of alcohol    Types: 3 Glasses of wine per week   Drug use: No   Sexual activity: Not on file  Other Topics Concern   Not on file  Social History Narrative   Not on file   Social Determinants of Health   Financial Resource  Strain: Not on file  Food Insecurity: No Food Insecurity (05/19/2022)   Hunger Vital Sign    Worried About Running Out of Food in the Last Year: Never true    Ran Out of Food in the Last Year: Never true  Transportation Needs: No Transportation Needs (05/19/2022)   PRAPARE - Hydrologist (Medical): No    Lack of Transportation (Non-Medical): No  Recent Concern: Transportation Needs - Unmet Transportation Needs (03/21/2022)   PRAPARE - Transportation    Lack of Transportation (Medical): Yes    Lack of Transportation (Non-Medical): Yes  Physical Activity: Not on file  Stress: Not on file  Social Connections: Not on file  Intimate Partner Violence: Not At Risk (05/19/2022)   Humiliation, Afraid, Rape, and Kick questionnaire    Fear of Current or Ex-Partner: No    Emotionally Abused: No    Physically Abused: No    Sexually Abused: No    Past Surgical History:  Procedure Laterality Date   ABDOMINAL AORTIC ANEURYSM REPAIR     ACHILLES TENDON SURGERY Left 01/10/2021   Procedure: ACHILLES LENGTHENING/KIDNER;   Surgeon: Caroline More, DPM;  Location: [facility] ORS;  Service: Podiatry;  Laterality: Left;   AMPUTATION Left 10/03/2021   Procedure: AMPUTATION BELOW KNEE;  Surgeon: Katha Cabal, MD;  Location: [facility] ORS;  Service: Vascular;  Laterality: Left;   AMPUTATION Right 05/24/2022   Procedure: AMPUTATION BELOW KNEE;  Surgeon: Algernon Huxley, MD;  Location: [facility] OR[facility];  Service: General;  Laterality: Right;   AMPUTATION TOE Right 02/10/2016   Procedure: AMPUTATION TOE 3RD TOE;  Surgeon: Samara Deist, DPM;  Location: [facility];  Service: Podiatry;  Laterality: Right;   AMPUTATION TOE Left 02/24/2020   Procedure: AMPUTATION TOE;  Surgeon: Caroline More, DPM;  Location: [facility];  Service: Podiatry;  Laterality: Left;   APPLICATION OF WOUND VAC Left 02/29/2020   Procedure: APPLICATION OF WOUND VAC;  Surgeon: Caroline More, DPM;  Location: [facility];  Service: Podiatry;  Laterality: Left;   CARDIAC CATHETERIZATION     CARDIOVERSION [name] 02/13/2022   Procedure: CARDIOVERSION;  Surgeon: Corey Skains, MD;  Location: [facility] OR[facility];  Service: Cardiovascular;  Laterality: [name];   COLONOSCOPY WITH PROPOFOL [name] 11/18/2015   Procedure: COLONOSCOPY WITH PROPOFOL;  Surgeon: Manya Silvas, MD;  Location: [facility];  Service: Endoscopy;  Laterality: [name];   CORONARY ARTERY BYPASS GRAFT     CORONARY STENT INTERVENTION [name] 02/02/2020   Procedure: CORONARY STENT INTERVENTION;  Surgeon: Isaias Cowman, MD;  Location: [place] CV LAB;  Service: Cardiovascular;  Laterality: [name];   INCISION AND DRAINAGE Left 08/07/2021   Procedure: INCISION AND DRAINAGE-Partial Calcanectomy;  Surgeon: Caroline More, DPM;  Location: [facility];  Service: Podiatry;  Laterality: Left;   IRRIGATION AND DEBRIDEMENT FOOT Left 02/29/2020   Procedure: IRRIGATION AND DEBRIDEMENT FOOT;  Surgeon: Caroline More, DPM;  Location: [facility];  Service: Podiatry;  Laterality: Left;   IRRIGATION AND DEBRIDEMENT FOOT Left 02/24/2020   Procedure:  IRRIGATION AND DEBRIDEMENT FOOT;  Surgeon: Caroline More, DPM;  Location: [facility];  Service: Podiatry;  Laterality: Left;   KNEE ARTHROSCOPY     LEFT HEART CATH AND CORS/GRAFTS ANGIOGRAPHY [name] 02/02/2020   Procedure: LEFT HEART CATH AND CORS/GRAFTS ANGIOGRAPHY;  Surgeon: Teodoro Spray, MD;  Location: [place] CV LAB;  Service: Cardiovascular;  Laterality: [name];   LOWER EXTREMITY ANGIOGRAPHY Left 02/25/2020   Procedure: Lower Extremity Angiography;  Surgeon: Algernon Huxley, MD;  Location: [facility]  INVASIVE CV LAB;  Service: Cardiovascular;  Laterality: Left;   LOWER EXTREMITY ANGIOGRAPHY Left 01/04/2021   Procedure: LOWER EXTREMITY ANGIOGRAPHY;  Surgeon: Algernon Huxley, MD;  Location: [place] [facility] LAB;  Service: Cardiovascular;  Laterality: Left;   LOWER EXTREMITY ANGIOGRAPHY Right 05/21/2022   Procedure: Lower Extremity Angiography;  Surgeon: Algernon Huxley, MD;  Location: [place] CV LAB;  Service: Cardiovascular;  Laterality: Right;   METATARSAL HEAD EXCISION Left 01/10/2021   Procedure: METATARSAL HEAD EXCISION - LEFT 5th;  Surgeon: Caroline More, DPM;  Location: [facility] OR[facility];  Service: Podiatry;  Laterality: Left;   PERIPHERAL VASCULAR CATHETERIZATION Right 01/24/2016   Procedure: Lower Extremity Angiography;  Surgeon: Katha Cabal, MD;  Location: [place] CV LAB;  Service: Cardiovascular;  Laterality: Right;   PERIPHERAL VASCULAR CATHETERIZATION Right 01/25/2016   Procedure: Lower Extremity Angiography;  Surgeon: Katha Cabal, MD;  Location: [place] CV LAB;  Service: Cardiovascular;  Laterality: Right;   TOE AMPUTATION     TONSILLECTOMY      Family History  Problem Relation Age of Onset   Hypertension Father    Coronary artery disease Father    Alcohol abuse Father    Heart failure Brother     Allergies  Allergen Reactions   Penicillins Other (See Comments)    Happened at 67 years old and pt. stated he passed out  He has tolerated amoxicillin/clavulanate and  ampicillin/sulbactam   Statins     Other reaction(s): Muscle Pain Causes legs to ache per pt   Metformin And Related Diarrhea       Latest Ref Rng & Units 06/06/2022    2:59 AM 06/05/2022    3:21 AM 06/04/2022    3:43 AM  CBC  WBC 4.0 - 10.5 K/uL 9.8  10.9  11.0   Hemoglobin 13.0 - 17.0 g/dL 8.5  8.4  8.5   Hematocrit 39.0 - 52.0 % 27.6  27.4  27.6   Platelets 150 - 400 K/uL 310  349  357       CMP     Component Value Date/Time   NA 135 06/06/2022 0259   NA 141 07/12/2011 0919   K 4.6 06/06/2022 0259   K 4.4 07/12/2011 0919   CL 103 06/06/2022 0259   CL 102 07/12/2011 0919   CO2 26 06/06/2022 0259   CO2 29 07/12/2011 0919   GLUCOSE 161 (H) 06/06/2022 0259   GLUCOSE 76 07/12/2011 0919   BUN 49 (H) 06/06/2022 0259   BUN 16 05/05/2013 1022   CREATININE 0.89 06/06/2022 0259   CREATININE 0.71 05/05/2013 1022   CALCIUM 8.8 (L) 06/06/2022 0259   CALCIUM 8.8 07/12/2011 0919   PROT 7.4 05/18/2022 1527   ALBUMIN 2.6 (L) 05/18/2022 1527   AST 18 05/18/2022 1527   ALT 25 05/18/2022 1527   ALKPHOS 170 (H) 05/18/2022 1527   BILITOT 0.3 05/18/2022 1527   GFRNONAA >60 06/06/2022 0259   GFRNONAA >60 05/05/2013 1022   GFRAA >60 01/06/2020 1853   GFRAA >60 05/05/2013 1022     No results found.     Assessment & Plan:   1. Hx of BKA, right ([place]) The patient had what appeared to be somewhat macerated wound with noted purulent drainage.  We have sent the culture of the wound out and sent in Bactrim for the patient.  All the patient's staples were removed and Steri-Strips applied.  We also used some larger butterfly ties due to some slight dehiscence of the wound.  Will plan on having the patient return in 1 to 2 weeks for wound evaluation.   Current Outpatient Medications on File Prior to Visit  Medication Sig Dispense Refill   acetaminophen (TYLENOL) 325 MG tablet Take 2 tablets (650 mg total) by mouth every 6 (six) hours as needed for mild pain (or Fever >/= 101).     amiodarone  (PACERONE) 200 MG tablet Take 200 mg by mouth daily.     apixaban (ELIQUIS) 5 MG TABS tablet Take 5 mg by mouth 2 (two) times daily.     aspirin EC 81 MG tablet Take 81 mg by mouth daily. Swallow whole.     Cholecalciferol 25 MCG (1000 UT) tablet Take 1,000 Units by mouth daily.     Continuous Blood Gluc Sensor (FREESTYLE LIBRE 3 SENSOR) MISC      Cyanocobalamin (VITAMIN B-12) 5000 MCG TBDP Take 10,000 mcg by mouth daily.     Ensure Max Protein (ENSURE MAX PROTEIN) LIQD Take 330 mLs (11 oz total) by mouth at bedtime. 9900 mL 0   ezetimibe (ZETIA) 10 MG tablet Take 10 mg by mouth at bedtime.     ferrous sulfate 325 (65 FE) MG tablet Take 325 mg by mouth daily with breakfast.     finasteride (PROSCAR) 5 MG tablet Take 1 tablet (5 mg total) by mouth daily. 30 tablet 0   furosemide (LASIX) 20 MG tablet Take 1 tablet (20 mg total) by mouth ONCE OR TWICE daily (total daily dose maximum 40 mg) as needed for increased swelling, shortness of breath, weight gain 5+ lbs over 1-2 days. Seek medical care if these symptoms are not improving with increased dose, or if you become dizzy or have low blood pressure. 30 tablet 0   gabapentin (NEURONTIN) 300 MG capsule Take 300 mg by mouth at bedtime.     HUMALOG 100 UNIT/ML injection Inject 15 Units into the skin 3 (three) times daily with meals. >200     Infant Care Products (DERMACLOUD) OINT Apply 1 Application topically daily as needed.     insulin glargine (LANTUS) 100 UNIT/ML injection Inject 15 units into the skin daily in AM and inject 25 units into the skin daily in PM 10 mL 11   isosorbide mononitrate (IMDUR) 60 MG 24 hr tablet Take 60 mg by mouth in the morning and at bedtime.     levothyroxine (SYNTHROID) 25 MCG tablet Take 1 tablet (25 mcg total) by mouth daily at 6 (six) AM. 30 tablet 0   losartan (COZAAR) 25 MG tablet Take 1 tablet (25 mg total) by mouth daily. 30 tablet 0   Multiple Vitamins-Minerals (MULTIVITAMIN WITH MINERALS) tablet Take 1 tablet by  mouth daily.     nitrofurantoin, macrocrystal-monohydrate, (MACROBID) 100 MG capsule Take 100 mg by mouth daily.     nitroGLYCERIN (NITROSTAT) 0.4 MG SL tablet Place 0.4 mg under the tongue every 5 (five) minutes as needed for chest pain.     nutrition supplement, JUVEN, (JUVEN) PACK Take 1 packet by mouth 2 (two) times daily between meals. 60 packet 0   nystatin (MYCOSTATIN/NYSTOP) powder Apply 1 Application topically 2 (two) times daily.     ondansetron (ZOFRAN) 4 MG tablet Take 1 tablet (4 mg total) by mouth every 6 (six) hours as needed for nausea. 20 tablet 0   pramipexole (MIRAPEX) 1 MG tablet Take 2 mg by mouth at bedtime.     primidone (MYSOLINE) 250 MG tablet Take 250 mg by mouth 2 (two) times daily.  risperiDONE (RISPERDAL) 0.5 MG tablet Take 0.5 tablets (0.25 mg total) by mouth 2 (two) times daily. 60 tablet 0   tamsulosin (FLOMAX) 0.4 MG CAPS capsule Take 0.4 mg by mouth daily.     vitamin C (ASCORBIC ACID) 500 MG tablet Take 500 mg by mouth daily.     zinc gluconate 50 MG tablet Take 50 mg by mouth daily.     zinc sulfate 220 (50 Zn) MG capsule Take 1 capsule (220 mg total) by mouth daily.     budesonide (PULMICORT) 0.5 MG/2ML nebulizer solution Take 2 mLs (0.5 mg total) by nebulization 2 (two) times daily. (Patient not taking: Reported on 05/21/2022) 120 mL 0   No current facility-administered medications on file prior to visit.    There are no Patient Instructions on file for this visit. No follow-ups on file.   Kris Hartmann, NP

## 2022-06-20 NOTE — Telephone Encounter (Signed)
Pt c/o medication issue:  1. Name of Medication:   diltiazem (CARDIZEM CD) 300 MG 24 hr capsule   2. How are you currently taking this medication (dosage and times per day)?   As prescribed  3. Are you having a reaction (difficulty breathing--STAT)?   No  4. What is your medication issue?   Wife stated patient has been taking 300 mg of this medication and is now completely out of this medication since Monday.  Wife stated his previous cardiologist - Dr. Ubaldo Glassing had also prescribed the following medications  nitroGLYCERIN (NITROSTAT) 0.4 MG SL tablet  amiodarone (PACERONE) 200 MG tablet  isosorbide mononitrate (IMDUR) 60 MG 24 hr tablet   Wife would like a call back to discuss this.

## 2022-06-21 DIAGNOSIS — Z79899 Other long term (current) drug therapy: Secondary | ICD-10-CM | POA: Diagnosis not present

## 2022-06-21 DIAGNOSIS — E785 Hyperlipidemia, unspecified: Secondary | ICD-10-CM | POA: Diagnosis not present

## 2022-06-21 DIAGNOSIS — I2510 Atherosclerotic heart disease of native coronary artery without angina pectoris: Secondary | ICD-10-CM | POA: Diagnosis not present

## 2022-06-21 DIAGNOSIS — Z0000 Encounter for general adult medical examination without abnormal findings: Secondary | ICD-10-CM | POA: Diagnosis not present

## 2022-06-21 DIAGNOSIS — E079 Disorder of thyroid, unspecified: Secondary | ICD-10-CM | POA: Diagnosis not present

## 2022-06-21 DIAGNOSIS — E1151 Type 2 diabetes mellitus with diabetic peripheral angiopathy without gangrene: Secondary | ICD-10-CM | POA: Diagnosis not present

## 2022-06-21 DIAGNOSIS — I10 Essential (primary) hypertension: Secondary | ICD-10-CM | POA: Diagnosis not present

## 2022-06-21 MED ORDER — DILTIAZEM HCL ER COATED BEADS 300 MG PO CP24: 300.0000 mg | ORAL_CAPSULE | Freq: Every day | ORAL | 1 refills | Status: DC

## 2022-06-21 NOTE — Telephone Encounter (Signed)
Okay to refill all the medications listed.  They do not really mention why they stopped diltiazem.  He is supposed to be on it.

## 2022-06-21 NOTE — Telephone Encounter (Signed)
Patient's wife has been made aware. Diltiazem 300 mg once daily has been sent in.

## 2022-06-21 NOTE — Telephone Encounter (Signed)
Left a message for the patient or wife to call back.

## 2022-07-03 ENCOUNTER — Ambulatory Visit (INDEPENDENT_AMBULATORY_CARE_PROVIDER_SITE_OTHER): Payer: Medicare Other | Admitting: Vascular Surgery

## 2022-07-03 DIAGNOSIS — E039 Hypothyroidism, unspecified: Secondary | ICD-10-CM | POA: Diagnosis not present

## 2022-07-03 DIAGNOSIS — I2510 Atherosclerotic heart disease of native coronary artery without angina pectoris: Secondary | ICD-10-CM | POA: Diagnosis not present

## 2022-07-03 DIAGNOSIS — T8189XD Other complications of procedures, not elsewhere classified, subsequent encounter: Secondary | ICD-10-CM | POA: Diagnosis not present

## 2022-07-03 DIAGNOSIS — I5032 Chronic diastolic (congestive) heart failure: Secondary | ICD-10-CM | POA: Diagnosis not present

## 2022-07-03 DIAGNOSIS — Z9981 Dependence on supplemental oxygen: Secondary | ICD-10-CM | POA: Diagnosis not present

## 2022-07-03 DIAGNOSIS — J449 Chronic obstructive pulmonary disease, unspecified: Secondary | ICD-10-CM | POA: Diagnosis not present

## 2022-07-03 DIAGNOSIS — M86171 Other acute osteomyelitis, right ankle and foot: Secondary | ICD-10-CM | POA: Diagnosis not present

## 2022-07-03 DIAGNOSIS — I480 Paroxysmal atrial fibrillation: Secondary | ICD-10-CM | POA: Diagnosis not present

## 2022-07-03 DIAGNOSIS — E669 Obesity, unspecified: Secondary | ICD-10-CM | POA: Diagnosis not present

## 2022-07-03 DIAGNOSIS — E1151 Type 2 diabetes mellitus with diabetic peripheral angiopathy without gangrene: Secondary | ICD-10-CM | POA: Diagnosis not present

## 2022-07-03 DIAGNOSIS — G4733 Obstructive sleep apnea (adult) (pediatric): Secondary | ICD-10-CM | POA: Diagnosis not present

## 2022-07-03 DIAGNOSIS — I110 Hypertensive heart disease with heart failure: Secondary | ICD-10-CM | POA: Diagnosis not present

## 2022-07-03 DIAGNOSIS — J9611 Chronic respiratory failure with hypoxia: Secondary | ICD-10-CM | POA: Diagnosis not present

## 2022-07-03 DIAGNOSIS — E11649 Type 2 diabetes mellitus with hypoglycemia without coma: Secondary | ICD-10-CM | POA: Diagnosis not present

## 2022-07-03 DIAGNOSIS — E1169 Type 2 diabetes mellitus with other specified complication: Secondary | ICD-10-CM | POA: Diagnosis not present

## 2022-07-03 DIAGNOSIS — N179 Acute kidney failure, unspecified: Secondary | ICD-10-CM | POA: Diagnosis not present

## 2022-07-04 DIAGNOSIS — I480 Paroxysmal atrial fibrillation: Secondary | ICD-10-CM | POA: Diagnosis not present

## 2022-07-04 DIAGNOSIS — Z9981 Dependence on supplemental oxygen: Secondary | ICD-10-CM | POA: Diagnosis not present

## 2022-07-04 DIAGNOSIS — G4733 Obstructive sleep apnea (adult) (pediatric): Secondary | ICD-10-CM | POA: Diagnosis not present

## 2022-07-04 DIAGNOSIS — I5032 Chronic diastolic (congestive) heart failure: Secondary | ICD-10-CM | POA: Diagnosis not present

## 2022-07-04 DIAGNOSIS — I2510 Atherosclerotic heart disease of native coronary artery without angina pectoris: Secondary | ICD-10-CM | POA: Diagnosis not present

## 2022-07-04 DIAGNOSIS — J449 Chronic obstructive pulmonary disease, unspecified: Secondary | ICD-10-CM | POA: Diagnosis not present

## 2022-07-04 DIAGNOSIS — T8189XD Other complications of procedures, not elsewhere classified, subsequent encounter: Secondary | ICD-10-CM | POA: Diagnosis not present

## 2022-07-04 DIAGNOSIS — E11649 Type 2 diabetes mellitus with hypoglycemia without coma: Secondary | ICD-10-CM | POA: Diagnosis not present

## 2022-07-04 DIAGNOSIS — E039 Hypothyroidism, unspecified: Secondary | ICD-10-CM | POA: Diagnosis not present

## 2022-07-04 DIAGNOSIS — I110 Hypertensive heart disease with heart failure: Secondary | ICD-10-CM | POA: Diagnosis not present

## 2022-07-04 DIAGNOSIS — E669 Obesity, unspecified: Secondary | ICD-10-CM | POA: Diagnosis not present

## 2022-07-04 DIAGNOSIS — E1151 Type 2 diabetes mellitus with diabetic peripheral angiopathy without gangrene: Secondary | ICD-10-CM | POA: Diagnosis not present

## 2022-07-04 DIAGNOSIS — M86171 Other acute osteomyelitis, right ankle and foot: Secondary | ICD-10-CM | POA: Diagnosis not present

## 2022-07-04 DIAGNOSIS — E1169 Type 2 diabetes mellitus with other specified complication: Secondary | ICD-10-CM | POA: Diagnosis not present

## 2022-07-04 DIAGNOSIS — N179 Acute kidney failure, unspecified: Secondary | ICD-10-CM | POA: Diagnosis not present

## 2022-07-04 DIAGNOSIS — J9611 Chronic respiratory failure with hypoxia: Secondary | ICD-10-CM | POA: Diagnosis not present

## 2022-07-07 DIAGNOSIS — M6281 Muscle weakness (generalized): Secondary | ICD-10-CM | POA: Diagnosis not present

## 2022-07-07 DIAGNOSIS — J9601 Acute respiratory failure with hypoxia: Secondary | ICD-10-CM | POA: Diagnosis not present

## 2022-07-07 DIAGNOSIS — Z4781 Encounter for orthopedic aftercare following surgical amputation: Secondary | ICD-10-CM | POA: Diagnosis not present

## 2022-07-10 DIAGNOSIS — E039 Hypothyroidism, unspecified: Secondary | ICD-10-CM | POA: Diagnosis not present

## 2022-07-10 DIAGNOSIS — E1169 Type 2 diabetes mellitus with other specified complication: Secondary | ICD-10-CM | POA: Diagnosis not present

## 2022-07-10 DIAGNOSIS — G4733 Obstructive sleep apnea (adult) (pediatric): Secondary | ICD-10-CM | POA: Diagnosis not present

## 2022-07-10 DIAGNOSIS — E11649 Type 2 diabetes mellitus with hypoglycemia without coma: Secondary | ICD-10-CM | POA: Diagnosis not present

## 2022-07-10 DIAGNOSIS — J9611 Chronic respiratory failure with hypoxia: Secondary | ICD-10-CM | POA: Diagnosis not present

## 2022-07-10 DIAGNOSIS — I110 Hypertensive heart disease with heart failure: Secondary | ICD-10-CM | POA: Diagnosis not present

## 2022-07-10 DIAGNOSIS — J449 Chronic obstructive pulmonary disease, unspecified: Secondary | ICD-10-CM | POA: Diagnosis not present

## 2022-07-10 DIAGNOSIS — I2510 Atherosclerotic heart disease of native coronary artery without angina pectoris: Secondary | ICD-10-CM | POA: Diagnosis not present

## 2022-07-10 DIAGNOSIS — E669 Obesity, unspecified: Secondary | ICD-10-CM | POA: Diagnosis not present

## 2022-07-10 DIAGNOSIS — M86171 Other acute osteomyelitis, right ankle and foot: Secondary | ICD-10-CM | POA: Diagnosis not present

## 2022-07-10 DIAGNOSIS — N179 Acute kidney failure, unspecified: Secondary | ICD-10-CM | POA: Diagnosis not present

## 2022-07-10 DIAGNOSIS — Z9981 Dependence on supplemental oxygen: Secondary | ICD-10-CM | POA: Diagnosis not present

## 2022-07-10 DIAGNOSIS — I480 Paroxysmal atrial fibrillation: Secondary | ICD-10-CM | POA: Diagnosis not present

## 2022-07-10 DIAGNOSIS — E1151 Type 2 diabetes mellitus with diabetic peripheral angiopathy without gangrene: Secondary | ICD-10-CM | POA: Diagnosis not present

## 2022-07-10 DIAGNOSIS — I5032 Chronic diastolic (congestive) heart failure: Secondary | ICD-10-CM | POA: Diagnosis not present

## 2022-07-10 DIAGNOSIS — T8189XD Other complications of procedures, not elsewhere classified, subsequent encounter: Secondary | ICD-10-CM | POA: Diagnosis not present

## 2022-07-11 DIAGNOSIS — E1169 Type 2 diabetes mellitus with other specified complication: Secondary | ICD-10-CM | POA: Diagnosis not present

## 2022-07-11 DIAGNOSIS — M86171 Other acute osteomyelitis, right ankle and foot: Secondary | ICD-10-CM | POA: Diagnosis not present

## 2022-07-11 DIAGNOSIS — E669 Obesity, unspecified: Secondary | ICD-10-CM | POA: Diagnosis not present

## 2022-07-11 DIAGNOSIS — T8189XD Other complications of procedures, not elsewhere classified, subsequent encounter: Secondary | ICD-10-CM | POA: Diagnosis not present

## 2022-07-11 DIAGNOSIS — I2510 Atherosclerotic heart disease of native coronary artery without angina pectoris: Secondary | ICD-10-CM | POA: Diagnosis not present

## 2022-07-11 DIAGNOSIS — E1151 Type 2 diabetes mellitus with diabetic peripheral angiopathy without gangrene: Secondary | ICD-10-CM | POA: Diagnosis not present

## 2022-07-11 DIAGNOSIS — E11649 Type 2 diabetes mellitus with hypoglycemia without coma: Secondary | ICD-10-CM | POA: Diagnosis not present

## 2022-07-11 DIAGNOSIS — N179 Acute kidney failure, unspecified: Secondary | ICD-10-CM | POA: Diagnosis not present

## 2022-07-11 DIAGNOSIS — J449 Chronic obstructive pulmonary disease, unspecified: Secondary | ICD-10-CM | POA: Diagnosis not present

## 2022-07-11 DIAGNOSIS — I5032 Chronic diastolic (congestive) heart failure: Secondary | ICD-10-CM | POA: Diagnosis not present

## 2022-07-11 DIAGNOSIS — J9611 Chronic respiratory failure with hypoxia: Secondary | ICD-10-CM | POA: Diagnosis not present

## 2022-07-11 DIAGNOSIS — I480 Paroxysmal atrial fibrillation: Secondary | ICD-10-CM | POA: Diagnosis not present

## 2022-07-11 DIAGNOSIS — I110 Hypertensive heart disease with heart failure: Secondary | ICD-10-CM | POA: Diagnosis not present

## 2022-07-11 DIAGNOSIS — G4733 Obstructive sleep apnea (adult) (pediatric): Secondary | ICD-10-CM | POA: Diagnosis not present

## 2022-07-11 DIAGNOSIS — Z9981 Dependence on supplemental oxygen: Secondary | ICD-10-CM | POA: Diagnosis not present

## 2022-07-11 DIAGNOSIS — E039 Hypothyroidism, unspecified: Secondary | ICD-10-CM | POA: Diagnosis not present

## 2022-07-12 ENCOUNTER — Telehealth: Payer: Self-pay | Admitting: Family Medicine

## 2022-07-12 DIAGNOSIS — E669 Obesity, unspecified: Secondary | ICD-10-CM | POA: Diagnosis not present

## 2022-07-12 DIAGNOSIS — M86171 Other acute osteomyelitis, right ankle and foot: Secondary | ICD-10-CM | POA: Diagnosis not present

## 2022-07-12 DIAGNOSIS — E1151 Type 2 diabetes mellitus with diabetic peripheral angiopathy without gangrene: Secondary | ICD-10-CM | POA: Diagnosis not present

## 2022-07-12 DIAGNOSIS — N179 Acute kidney failure, unspecified: Secondary | ICD-10-CM | POA: Diagnosis not present

## 2022-07-12 DIAGNOSIS — J9611 Chronic respiratory failure with hypoxia: Secondary | ICD-10-CM | POA: Diagnosis not present

## 2022-07-12 DIAGNOSIS — E1169 Type 2 diabetes mellitus with other specified complication: Secondary | ICD-10-CM | POA: Diagnosis not present

## 2022-07-12 DIAGNOSIS — G4733 Obstructive sleep apnea (adult) (pediatric): Secondary | ICD-10-CM | POA: Diagnosis not present

## 2022-07-12 DIAGNOSIS — Z9981 Dependence on supplemental oxygen: Secondary | ICD-10-CM | POA: Diagnosis not present

## 2022-07-12 DIAGNOSIS — E11649 Type 2 diabetes mellitus with hypoglycemia without coma: Secondary | ICD-10-CM | POA: Diagnosis not present

## 2022-07-12 DIAGNOSIS — J449 Chronic obstructive pulmonary disease, unspecified: Secondary | ICD-10-CM | POA: Diagnosis not present

## 2022-07-12 DIAGNOSIS — I110 Hypertensive heart disease with heart failure: Secondary | ICD-10-CM | POA: Diagnosis not present

## 2022-07-12 DIAGNOSIS — I2510 Atherosclerotic heart disease of native coronary artery without angina pectoris: Secondary | ICD-10-CM | POA: Diagnosis not present

## 2022-07-12 DIAGNOSIS — T8189XD Other complications of procedures, not elsewhere classified, subsequent encounter: Secondary | ICD-10-CM | POA: Diagnosis not present

## 2022-07-12 DIAGNOSIS — E039 Hypothyroidism, unspecified: Secondary | ICD-10-CM | POA: Diagnosis not present

## 2022-07-12 DIAGNOSIS — I480 Paroxysmal atrial fibrillation: Secondary | ICD-10-CM | POA: Diagnosis not present

## 2022-07-12 DIAGNOSIS — I5032 Chronic diastolic (congestive) heart failure: Secondary | ICD-10-CM | POA: Diagnosis not present

## 2022-07-12 NOTE — Telephone Encounter (Signed)
Patient called and states she has tried multiple times and left messages for scheduling to call her back to get her husband set up for the CT scan. No one is calling her back. I gave her a number to try and to call me back if she still doesn't get the appointment made.

## 2022-07-13 ENCOUNTER — Ambulatory Visit (INDEPENDENT_AMBULATORY_CARE_PROVIDER_SITE_OTHER): Payer: Medicare Other | Admitting: Vascular Surgery

## 2022-07-13 ENCOUNTER — Encounter (INDEPENDENT_AMBULATORY_CARE_PROVIDER_SITE_OTHER): Payer: Self-pay | Admitting: Vascular Surgery

## 2022-07-13 VITALS — BP 123/68 | HR 92 | Resp 16

## 2022-07-13 DIAGNOSIS — I10 Essential (primary) hypertension: Secondary | ICD-10-CM

## 2022-07-13 DIAGNOSIS — Z89511 Acquired absence of right leg below knee: Secondary | ICD-10-CM

## 2022-07-13 DIAGNOSIS — I739 Peripheral vascular disease, unspecified: Secondary | ICD-10-CM

## 2022-07-13 NOTE — Assessment & Plan Note (Signed)
Healing reasonably well.  Neosporin and Vaseline gauze over the small opening and that should heal in the next 2 to 3 weeks.  A new prescription was given for stump shrinker.  Recheck in about 6 weeks in the office.

## 2022-07-13 NOTE — Progress Notes (Signed)
Patient ID: Alec Less., male   DOB: 22-Apr-1956, 67 y.o.   MRN: [id number]  Chief Complaint  Patient presents with   Follow-up    2 week follow up    HPI [name]. is a 67 y.o. male.  Patient returns in follow-up after his right amputation.  He is about 2 months status post right below-knee amputation.  His wound is almost completely healed except for a very small opening on the medial portion with minimal drainage.  No fevers or chills.  No erythema or signs of infection.   Past Medical History:  Diagnosis Date   Arrhythmia    atrial fibrillation   Asthma    CHF (congestive heart failure) (HCC)    COPD (chronic obstructive pulmonary disease) (HCC)    Coronary artery disease    Depression    Diabetes mellitus without complication (HCC)    Gout    History anabolic steroid use    Hyperlipidemia    Hypertension    Hypogonadism in male    MI (myocardial infarction) ([place])    Morbid obesity ([place])    Myocardial infarction ([place])    Peripheral vascular disease ([place])    Perirectal abscess    Pleurisy    Sleep apnea    CPAP at night, no oxygen   Varicella     Past Surgical History:  Procedure Laterality Date   ABDOMINAL AORTIC ANEURYSM REPAIR     ACHILLES TENDON SURGERY Left 01/10/2021   Procedure: ACHILLES LENGTHENING/KIDNER;  Surgeon: Caroline More, DPM;  Location: [facility];  Service: Podiatry;  Laterality: Left;   AMPUTATION Left 10/03/2021   Procedure: AMPUTATION BELOW KNEE;  Surgeon: Katha Cabal, MD;  Location: [facility];  Service: Vascular;  Laterality: Left;   AMPUTATION Right 05/24/2022   Procedure: AMPUTATION BELOW KNEE;  Surgeon: Algernon Huxley, MD;  Location: [facility];  Service: General;  Laterality: Right;   AMPUTATION TOE Right 02/10/2016   Procedure: AMPUTATION TOE 3RD TOE;  Surgeon: Samara Deist, DPM;  Location: [facility];  Service: Podiatry;  Laterality: Right;   AMPUTATION TOE Left 02/24/2020   Procedure: AMPUTATION TOE;  Surgeon:  Caroline More, DPM;  Location: [facility];  Service: Podiatry;  Laterality: Left;   APPLICATION OF WOUND VAC Left 02/29/2020   Procedure: APPLICATION OF WOUND VAC;  Surgeon: Caroline More, DPM;  Location: [facility];  Service: Podiatry;  Laterality: Left;   CARDIAC CATHETERIZATION     CARDIOVERSION [name] 02/13/2022   Procedure: CARDIOVERSION;  Surgeon: Corey Skains, MD;  Location: [facility];  Service: Cardiovascular;  Laterality: [name];   COLONOSCOPY WITH PROPOFOL [name] 11/18/2015   Procedure: COLONOSCOPY WITH PROPOFOL;  Surgeon: Manya Silvas, MD;  Location: [facility];  Service: Endoscopy;  Laterality: [name];   CORONARY ARTERY BYPASS GRAFT     CORONARY STENT INTERVENTION [name] 02/02/2020   Procedure: CORONARY STENT INTERVENTION;  Surgeon: Isaias Cowman, MD;  Location: [place] [facility];  Service: Cardiovascular;  Laterality: [name];   INCISION AND DRAINAGE Left 08/07/2021   Procedure: INCISION AND DRAINAGE-Partial Calcanectomy;  Surgeon: Caroline More, DPM;  Location: [facility];  Service: Podiatry;  Laterality: Left;   IRRIGATION AND DEBRIDEMENT FOOT Left 02/29/2020   Procedure: IRRIGATION AND DEBRIDEMENT FOOT;  Surgeon: Caroline More, DPM;  Location: [facility];  Service: Podiatry;  Laterality: Left;   IRRIGATION AND DEBRIDEMENT FOOT Left 02/24/2020   Procedure: IRRIGATION AND DEBRIDEMENT FOOT;  Surgeon: Caroline More, DPM;  Location: [facility];  Service: Podiatry;  Laterality: Left;   KNEE ARTHROSCOPY     LEFT HEART CATH AND CORS/GRAFTS ANGIOGRAPHY [name] 02/02/2020   Procedure: LEFT HEART CATH AND CORS/GRAFTS ANGIOGRAPHY;  Surgeon: Teodoro Spray, MD;  Location: [place] [facility];  Service: Cardiovascular;  Laterality: [name];   LOWER EXTREMITY ANGIOGRAPHY Left 02/25/2020   Procedure: Lower Extremity Angiography;  Surgeon: Algernon Huxley, MD;  Location: [place] [facility];  Service: Cardiovascular;  Laterality: Left;   LOWER EXTREMITY ANGIOGRAPHY Left 01/04/2021   Procedure: LOWER EXTREMITY  ANGIOGRAPHY;  Surgeon: Algernon Huxley, MD;  Location: [place] [facility];  Service: Cardiovascular;  Laterality: Left;   LOWER EXTREMITY ANGIOGRAPHY Right 05/21/2022   Procedure: Lower Extremity Angiography;  Surgeon: Algernon Huxley, MD;  Location: [place] [facility];  Service: Cardiovascular;  Laterality: Right;   METATARSAL HEAD EXCISION Left 01/10/2021   Procedure: METATARSAL HEAD EXCISION - LEFT 5th;  Surgeon: Caroline More, DPM;  Location: [facility];  Service: Podiatry;  Laterality: Left;   PERIPHERAL VASCULAR CATHETERIZATION Right 01/24/2016   Procedure: Lower Extremity Angiography;  Surgeon: Katha Cabal, MD;  Location: [place] [facility];  Service: Cardiovascular;  Laterality: Right;   PERIPHERAL VASCULAR CATHETERIZATION Right 01/25/2016   Procedure: Lower Extremity Angiography;  Surgeon: Katha Cabal, MD;  Location: [place] [facility];  Service: Cardiovascular;  Laterality: Right;   TOE AMPUTATION     TONSILLECTOMY        Allergies  Allergen Reactions   Penicillins Other (See Comments)    Happened at 67 years old and pt. stated he passed out  He has tolerated amoxicillin/clavulanate and ampicillin/sulbactam   Statins     Other reaction(s): Muscle Pain Causes legs to ache per pt   Metformin And Related Diarrhea    Current Outpatient Medications  Medication Sig Dispense Refill   acetaminophen (TYLENOL) 325 MG tablet Take 2 tablets (650 mg total) by mouth every 6 (six) hours as needed for mild pain (or Fever >/= 101).     amiodarone (PACERONE) 200 MG tablet Take 1 tablet (200 mg total) by mouth daily. 90 tablet 3   apixaban (ELIQUIS) 5 MG TABS tablet Take 5 mg by mouth 2 (two) times daily.     aspirin EC 81 MG tablet Take 81 mg by mouth daily. Swallow whole.     Cholecalciferol 25 MCG (1000 UT) tablet Take 1,000 Units by mouth daily.     Continuous Blood Gluc Sensor (FREESTYLE LIBRE 3 SENSOR) MISC      Cyanocobalamin (VITAMIN B-12) 5000 MCG TBDP Take 10,000 mcg by  mouth daily.     diltiazem (CARDIZEM CD) 300 MG 24 hr capsule Take 1 capsule (300 mg total) by mouth daily. 90 capsule 1   ezetimibe (ZETIA) 10 MG tablet Take 10 mg by mouth at bedtime.     ferrous sulfate 325 (65 FE) MG tablet Take 325 mg by mouth daily with breakfast.     finasteride (PROSCAR) 5 MG tablet Take 1 tablet (5 mg total) by mouth daily. 30 tablet 0   furosemide (LASIX) 20 MG tablet Take 1 tablet (20 mg total) by mouth ONCE OR TWICE daily (total daily dose maximum 40 mg) as needed for increased swelling, shortness of breath, weight gain 5+ lbs over 1-2 days. Seek medical care if these symptoms are not improving with increased dose, or if you become dizzy or have low blood pressure. 30 tablet 0   gabapentin (NEURONTIN) 300 MG capsule Take 300 mg by mouth at bedtime.  HUMALOG 100 UNIT/ML injection Inject 15 Units into the skin 3 (three) times daily with meals. >200     Infant Care Products (DERMACLOUD) OINT Apply 1 Application topically daily as needed.     insulin glargine (LANTUS) 100 UNIT/ML injection Inject 15 units into the skin daily in AM and inject 25 units into the skin daily in PM 10 mL 11   isosorbide mononitrate (IMDUR) 60 MG 24 hr tablet Take 1 tablet (60 mg total) by mouth in the morning and at bedtime. 180 tablet 3   levothyroxine (SYNTHROID) 25 MCG tablet Take 1 tablet (25 mcg total) by mouth daily at 6 (six) AM. 30 tablet 0   losartan (COZAAR) 25 MG tablet Take 1 tablet (25 mg total) by mouth daily. 30 tablet 0   Multiple Vitamins-Minerals (MULTIVITAMIN WITH MINERALS) tablet Take 1 tablet by mouth daily.     nitrofurantoin, macrocrystal-monohydrate, (MACROBID) 100 MG capsule Take 100 mg by mouth daily.     nitroGLYCERIN (NITROSTAT) 0.4 MG SL tablet Place 1 tablet (0.4 mg total) under the tongue every 5 (five) minutes as needed for chest pain. 25 tablet 3   nutrition supplement, JUVEN, (JUVEN) PACK Take 1 packet by mouth 2 (two) times daily between meals. 60 packet 0    nystatin (MYCOSTATIN/NYSTOP) powder Apply 1 Application topically 2 (two) times daily.     ondansetron (ZOFRAN) 4 MG tablet Take 1 tablet (4 mg total) by mouth every 6 (six) hours as needed for nausea. 20 tablet 0   pramipexole (MIRAPEX) 1 MG tablet Take 2 mg by mouth at bedtime.     primidone (MYSOLINE) 250 MG tablet Take 250 mg by mouth 2 (two) times daily.     risperiDONE (RISPERDAL) 0.5 MG tablet Take 0.5 tablets (0.25 mg total) by mouth 2 (two) times daily. 60 tablet 0   sulfamethoxazole-trimethoprim (BACTRIM DS) 800-160 MG tablet Take 1 tablet by mouth 2 (two) times daily. 20 tablet 0   tamsulosin (FLOMAX) 0.4 MG CAPS capsule Take 0.4 mg by mouth daily.     vitamin C (ASCORBIC ACID) 500 MG tablet Take 500 mg by mouth daily.     zinc gluconate 50 MG tablet Take 50 mg by mouth daily.     zinc sulfate 220 (50 Zn) MG capsule Take 1 capsule (220 mg total) by mouth daily.     budesonide (PULMICORT) 0.5 MG/2ML nebulizer solution Take 2 mLs (0.5 mg total) by nebulization 2 (two) times daily. (Patient not taking: Reported on 05/21/2022) 120 mL 0   No current facility-administered medications for this visit.        Physical Exam BP 123/68 (BP Location: Left Arm)   Pulse 92   Resp 16  Gen:  WD/WN, NAD Skin: incision C/D/I with a very small opening medially with minimal drainage.  No erythema or signs of infection     Assessment/Plan:  Status post below-knee amputation of right lower extremity ([place]) Healing reasonably well.  Neosporin and Vaseline gauze over the small opening and that should heal in the next 2 to 3 weeks.  A new prescription was given for stump shrinker.  Recheck in about 6 weeks in the office.  PAD (peripheral artery disease) ([place]) Had intervention with restoration of flow but ultimately lost the limbs due to chronic infection in the foot and ankle.  Essential hypertension blood pressure control important in reducing the progression of atherosclerotic disease. On  appropriate oral medications.      [name] 07/13/2022, 12:28 PM  This note was created with Dragon medical transcription system.  Any errors from dictation are unintentional.

## 2022-07-13 NOTE — Assessment & Plan Note (Signed)
Had intervention with restoration of flow but ultimately lost the limbs due to chronic infection in the foot and ankle.

## 2022-07-13 NOTE — Assessment & Plan Note (Signed)
blood pressure control important in reducing the progression of atherosclerotic disease. On appropriate oral medications.  

## 2022-07-17 DIAGNOSIS — Z9981 Dependence on supplemental oxygen: Secondary | ICD-10-CM | POA: Diagnosis not present

## 2022-07-17 DIAGNOSIS — I480 Paroxysmal atrial fibrillation: Secondary | ICD-10-CM | POA: Diagnosis not present

## 2022-07-17 DIAGNOSIS — N179 Acute kidney failure, unspecified: Secondary | ICD-10-CM | POA: Diagnosis not present

## 2022-07-17 DIAGNOSIS — I2510 Atherosclerotic heart disease of native coronary artery without angina pectoris: Secondary | ICD-10-CM | POA: Diagnosis not present

## 2022-07-17 DIAGNOSIS — T8189XD Other complications of procedures, not elsewhere classified, subsequent encounter: Secondary | ICD-10-CM | POA: Diagnosis not present

## 2022-07-17 DIAGNOSIS — J449 Chronic obstructive pulmonary disease, unspecified: Secondary | ICD-10-CM | POA: Diagnosis not present

## 2022-07-17 DIAGNOSIS — I5032 Chronic diastolic (congestive) heart failure: Secondary | ICD-10-CM | POA: Diagnosis not present

## 2022-07-17 DIAGNOSIS — E1169 Type 2 diabetes mellitus with other specified complication: Secondary | ICD-10-CM | POA: Diagnosis not present

## 2022-07-17 DIAGNOSIS — E1151 Type 2 diabetes mellitus with diabetic peripheral angiopathy without gangrene: Secondary | ICD-10-CM | POA: Diagnosis not present

## 2022-07-17 DIAGNOSIS — E11649 Type 2 diabetes mellitus with hypoglycemia without coma: Secondary | ICD-10-CM | POA: Diagnosis not present

## 2022-07-17 DIAGNOSIS — M86171 Other acute osteomyelitis, right ankle and foot: Secondary | ICD-10-CM | POA: Diagnosis not present

## 2022-07-17 DIAGNOSIS — G4733 Obstructive sleep apnea (adult) (pediatric): Secondary | ICD-10-CM | POA: Diagnosis not present

## 2022-07-17 DIAGNOSIS — I110 Hypertensive heart disease with heart failure: Secondary | ICD-10-CM | POA: Diagnosis not present

## 2022-07-17 DIAGNOSIS — E669 Obesity, unspecified: Secondary | ICD-10-CM | POA: Diagnosis not present

## 2022-07-17 DIAGNOSIS — E039 Hypothyroidism, unspecified: Secondary | ICD-10-CM | POA: Diagnosis not present

## 2022-07-17 DIAGNOSIS — J9611 Chronic respiratory failure with hypoxia: Secondary | ICD-10-CM | POA: Diagnosis not present

## 2022-07-18 DIAGNOSIS — E11649 Type 2 diabetes mellitus with hypoglycemia without coma: Secondary | ICD-10-CM | POA: Diagnosis not present

## 2022-07-18 DIAGNOSIS — J449 Chronic obstructive pulmonary disease, unspecified: Secondary | ICD-10-CM | POA: Diagnosis not present

## 2022-07-18 DIAGNOSIS — G4733 Obstructive sleep apnea (adult) (pediatric): Secondary | ICD-10-CM | POA: Diagnosis not present

## 2022-07-18 DIAGNOSIS — T8189XD Other complications of procedures, not elsewhere classified, subsequent encounter: Secondary | ICD-10-CM | POA: Diagnosis not present

## 2022-07-18 DIAGNOSIS — I110 Hypertensive heart disease with heart failure: Secondary | ICD-10-CM | POA: Diagnosis not present

## 2022-07-18 DIAGNOSIS — E1169 Type 2 diabetes mellitus with other specified complication: Secondary | ICD-10-CM | POA: Diagnosis not present

## 2022-07-18 DIAGNOSIS — I2510 Atherosclerotic heart disease of native coronary artery without angina pectoris: Secondary | ICD-10-CM | POA: Diagnosis not present

## 2022-07-18 DIAGNOSIS — M86171 Other acute osteomyelitis, right ankle and foot: Secondary | ICD-10-CM | POA: Diagnosis not present

## 2022-07-18 DIAGNOSIS — E1151 Type 2 diabetes mellitus with diabetic peripheral angiopathy without gangrene: Secondary | ICD-10-CM | POA: Diagnosis not present

## 2022-07-18 DIAGNOSIS — N179 Acute kidney failure, unspecified: Secondary | ICD-10-CM | POA: Diagnosis not present

## 2022-07-18 DIAGNOSIS — E669 Obesity, unspecified: Secondary | ICD-10-CM | POA: Diagnosis not present

## 2022-07-18 DIAGNOSIS — J9611 Chronic respiratory failure with hypoxia: Secondary | ICD-10-CM | POA: Diagnosis not present

## 2022-07-18 DIAGNOSIS — Z9981 Dependence on supplemental oxygen: Secondary | ICD-10-CM | POA: Diagnosis not present

## 2022-07-18 DIAGNOSIS — I5032 Chronic diastolic (congestive) heart failure: Secondary | ICD-10-CM | POA: Diagnosis not present

## 2022-07-18 DIAGNOSIS — E039 Hypothyroidism, unspecified: Secondary | ICD-10-CM | POA: Diagnosis not present

## 2022-07-18 DIAGNOSIS — I480 Paroxysmal atrial fibrillation: Secondary | ICD-10-CM | POA: Diagnosis not present

## 2022-07-19 DIAGNOSIS — I10 Essential (primary) hypertension: Secondary | ICD-10-CM | POA: Diagnosis not present

## 2022-07-19 DIAGNOSIS — M6281 Muscle weakness (generalized): Secondary | ICD-10-CM | POA: Diagnosis not present

## 2022-07-19 DIAGNOSIS — G4733 Obstructive sleep apnea (adult) (pediatric): Secondary | ICD-10-CM | POA: Diagnosis not present

## 2022-07-19 DIAGNOSIS — J449 Chronic obstructive pulmonary disease, unspecified: Secondary | ICD-10-CM | POA: Diagnosis not present

## 2022-07-20 ENCOUNTER — Telehealth (INDEPENDENT_AMBULATORY_CARE_PROVIDER_SITE_OTHER): Payer: Self-pay | Admitting: Vascular Surgery

## 2022-07-20 NOTE — Telephone Encounter (Signed)
Pt's wife Dondra Spry called stating that Dr. Lucky Cowboy said he would place orders for a nurse aide to come to the house to help with bathing etc.  Also wife states that he still has a place on the bottom of the amputation -- [name] states that Dr. Lucky Cowboy stated to put neosporin and bandaid on the place. She states that the nurse that came the other day said she was told to wrap entire place. [name] needs clarification to be able to give nurse - wanting to have directions resent to nurse.   Please advise.

## 2022-07-20 NOTE — Telephone Encounter (Signed)
I spoke with [facility] and I will refax orders. Patient spouse was notified that orders will be refax and someone will call from [facility] with current status.

## 2022-07-23 DIAGNOSIS — T8189XD Other complications of procedures, not elsewhere classified, subsequent encounter: Secondary | ICD-10-CM | POA: Diagnosis not present

## 2022-07-23 DIAGNOSIS — E11649 Type 2 diabetes mellitus with hypoglycemia without coma: Secondary | ICD-10-CM | POA: Diagnosis not present

## 2022-07-23 DIAGNOSIS — E1169 Type 2 diabetes mellitus with other specified complication: Secondary | ICD-10-CM | POA: Diagnosis not present

## 2022-07-23 DIAGNOSIS — I480 Paroxysmal atrial fibrillation: Secondary | ICD-10-CM | POA: Diagnosis not present

## 2022-07-23 DIAGNOSIS — E1151 Type 2 diabetes mellitus with diabetic peripheral angiopathy without gangrene: Secondary | ICD-10-CM | POA: Diagnosis not present

## 2022-07-23 DIAGNOSIS — G4733 Obstructive sleep apnea (adult) (pediatric): Secondary | ICD-10-CM | POA: Diagnosis not present

## 2022-07-23 DIAGNOSIS — I2510 Atherosclerotic heart disease of native coronary artery without angina pectoris: Secondary | ICD-10-CM | POA: Diagnosis not present

## 2022-07-23 DIAGNOSIS — I110 Hypertensive heart disease with heart failure: Secondary | ICD-10-CM | POA: Diagnosis not present

## 2022-07-23 DIAGNOSIS — Z9981 Dependence on supplemental oxygen: Secondary | ICD-10-CM | POA: Diagnosis not present

## 2022-07-23 DIAGNOSIS — J9611 Chronic respiratory failure with hypoxia: Secondary | ICD-10-CM | POA: Diagnosis not present

## 2022-07-23 DIAGNOSIS — M86171 Other acute osteomyelitis, right ankle and foot: Secondary | ICD-10-CM | POA: Diagnosis not present

## 2022-07-23 DIAGNOSIS — I5032 Chronic diastolic (congestive) heart failure: Secondary | ICD-10-CM | POA: Diagnosis not present

## 2022-07-23 DIAGNOSIS — E669 Obesity, unspecified: Secondary | ICD-10-CM | POA: Diagnosis not present

## 2022-07-23 DIAGNOSIS — J449 Chronic obstructive pulmonary disease, unspecified: Secondary | ICD-10-CM | POA: Diagnosis not present

## 2022-07-23 DIAGNOSIS — N179 Acute kidney failure, unspecified: Secondary | ICD-10-CM | POA: Diagnosis not present

## 2022-07-23 DIAGNOSIS — E039 Hypothyroidism, unspecified: Secondary | ICD-10-CM | POA: Diagnosis not present

## 2022-07-24 DIAGNOSIS — J449 Chronic obstructive pulmonary disease, unspecified: Secondary | ICD-10-CM | POA: Diagnosis not present

## 2022-07-24 DIAGNOSIS — I480 Paroxysmal atrial fibrillation: Secondary | ICD-10-CM | POA: Diagnosis not present

## 2022-07-24 DIAGNOSIS — N179 Acute kidney failure, unspecified: Secondary | ICD-10-CM | POA: Diagnosis not present

## 2022-07-24 DIAGNOSIS — J9611 Chronic respiratory failure with hypoxia: Secondary | ICD-10-CM | POA: Diagnosis not present

## 2022-07-24 DIAGNOSIS — E669 Obesity, unspecified: Secondary | ICD-10-CM | POA: Diagnosis not present

## 2022-07-24 DIAGNOSIS — I110 Hypertensive heart disease with heart failure: Secondary | ICD-10-CM | POA: Diagnosis not present

## 2022-07-24 DIAGNOSIS — I2510 Atherosclerotic heart disease of native coronary artery without angina pectoris: Secondary | ICD-10-CM | POA: Diagnosis not present

## 2022-07-24 DIAGNOSIS — Z9981 Dependence on supplemental oxygen: Secondary | ICD-10-CM | POA: Diagnosis not present

## 2022-07-24 DIAGNOSIS — E039 Hypothyroidism, unspecified: Secondary | ICD-10-CM | POA: Diagnosis not present

## 2022-07-24 DIAGNOSIS — T8189XD Other complications of procedures, not elsewhere classified, subsequent encounter: Secondary | ICD-10-CM | POA: Diagnosis not present

## 2022-07-24 DIAGNOSIS — E11649 Type 2 diabetes mellitus with hypoglycemia without coma: Secondary | ICD-10-CM | POA: Diagnosis not present

## 2022-07-24 DIAGNOSIS — M86171 Other acute osteomyelitis, right ankle and foot: Secondary | ICD-10-CM | POA: Diagnosis not present

## 2022-07-24 DIAGNOSIS — E1169 Type 2 diabetes mellitus with other specified complication: Secondary | ICD-10-CM | POA: Diagnosis not present

## 2022-07-24 DIAGNOSIS — E1151 Type 2 diabetes mellitus with diabetic peripheral angiopathy without gangrene: Secondary | ICD-10-CM | POA: Diagnosis not present

## 2022-07-24 DIAGNOSIS — G4733 Obstructive sleep apnea (adult) (pediatric): Secondary | ICD-10-CM | POA: Diagnosis not present

## 2022-07-24 DIAGNOSIS — I5032 Chronic diastolic (congestive) heart failure: Secondary | ICD-10-CM | POA: Diagnosis not present

## 2022-07-25 DIAGNOSIS — I10 Essential (primary) hypertension: Secondary | ICD-10-CM | POA: Diagnosis not present

## 2022-07-25 DIAGNOSIS — G4733 Obstructive sleep apnea (adult) (pediatric): Secondary | ICD-10-CM | POA: Diagnosis not present

## 2022-07-30 DIAGNOSIS — J449 Chronic obstructive pulmonary disease, unspecified: Secondary | ICD-10-CM | POA: Diagnosis not present

## 2022-07-30 DIAGNOSIS — I480 Paroxysmal atrial fibrillation: Secondary | ICD-10-CM | POA: Diagnosis not present

## 2022-07-30 DIAGNOSIS — I110 Hypertensive heart disease with heart failure: Secondary | ICD-10-CM | POA: Diagnosis not present

## 2022-07-30 DIAGNOSIS — I5032 Chronic diastolic (congestive) heart failure: Secondary | ICD-10-CM | POA: Diagnosis not present

## 2022-07-30 DIAGNOSIS — N179 Acute kidney failure, unspecified: Secondary | ICD-10-CM | POA: Diagnosis not present

## 2022-07-30 DIAGNOSIS — E1169 Type 2 diabetes mellitus with other specified complication: Secondary | ICD-10-CM | POA: Diagnosis not present

## 2022-07-30 DIAGNOSIS — M86171 Other acute osteomyelitis, right ankle and foot: Secondary | ICD-10-CM | POA: Diagnosis not present

## 2022-07-30 DIAGNOSIS — J9611 Chronic respiratory failure with hypoxia: Secondary | ICD-10-CM | POA: Diagnosis not present

## 2022-07-30 DIAGNOSIS — E669 Obesity, unspecified: Secondary | ICD-10-CM | POA: Diagnosis not present

## 2022-07-30 DIAGNOSIS — E11649 Type 2 diabetes mellitus with hypoglycemia without coma: Secondary | ICD-10-CM | POA: Diagnosis not present

## 2022-07-30 DIAGNOSIS — Z9981 Dependence on supplemental oxygen: Secondary | ICD-10-CM | POA: Diagnosis not present

## 2022-07-30 DIAGNOSIS — I2510 Atherosclerotic heart disease of native coronary artery without angina pectoris: Secondary | ICD-10-CM | POA: Diagnosis not present

## 2022-07-30 DIAGNOSIS — E1151 Type 2 diabetes mellitus with diabetic peripheral angiopathy without gangrene: Secondary | ICD-10-CM | POA: Diagnosis not present

## 2022-07-30 DIAGNOSIS — G4733 Obstructive sleep apnea (adult) (pediatric): Secondary | ICD-10-CM | POA: Diagnosis not present

## 2022-07-30 DIAGNOSIS — T8189XD Other complications of procedures, not elsewhere classified, subsequent encounter: Secondary | ICD-10-CM | POA: Diagnosis not present

## 2022-07-30 DIAGNOSIS — E039 Hypothyroidism, unspecified: Secondary | ICD-10-CM | POA: Diagnosis not present

## 2022-07-30 NOTE — Telephone Encounter (Signed)
Please reach back out to [facility] as they have not received orders as of today 07/30/2022

## 2022-07-31 DIAGNOSIS — J9611 Chronic respiratory failure with hypoxia: Secondary | ICD-10-CM | POA: Diagnosis not present

## 2022-07-31 DIAGNOSIS — E039 Hypothyroidism, unspecified: Secondary | ICD-10-CM | POA: Diagnosis not present

## 2022-07-31 DIAGNOSIS — I5032 Chronic diastolic (congestive) heart failure: Secondary | ICD-10-CM | POA: Diagnosis not present

## 2022-07-31 DIAGNOSIS — G4733 Obstructive sleep apnea (adult) (pediatric): Secondary | ICD-10-CM | POA: Diagnosis not present

## 2022-07-31 DIAGNOSIS — E1151 Type 2 diabetes mellitus with diabetic peripheral angiopathy without gangrene: Secondary | ICD-10-CM | POA: Diagnosis not present

## 2022-07-31 DIAGNOSIS — J449 Chronic obstructive pulmonary disease, unspecified: Secondary | ICD-10-CM | POA: Diagnosis not present

## 2022-07-31 DIAGNOSIS — E669 Obesity, unspecified: Secondary | ICD-10-CM | POA: Diagnosis not present

## 2022-07-31 DIAGNOSIS — I480 Paroxysmal atrial fibrillation: Secondary | ICD-10-CM | POA: Diagnosis not present

## 2022-07-31 DIAGNOSIS — M86171 Other acute osteomyelitis, right ankle and foot: Secondary | ICD-10-CM | POA: Diagnosis not present

## 2022-07-31 DIAGNOSIS — T8189XD Other complications of procedures, not elsewhere classified, subsequent encounter: Secondary | ICD-10-CM | POA: Diagnosis not present

## 2022-07-31 DIAGNOSIS — I2510 Atherosclerotic heart disease of native coronary artery without angina pectoris: Secondary | ICD-10-CM | POA: Diagnosis not present

## 2022-07-31 DIAGNOSIS — I110 Hypertensive heart disease with heart failure: Secondary | ICD-10-CM | POA: Diagnosis not present

## 2022-07-31 DIAGNOSIS — E11649 Type 2 diabetes mellitus with hypoglycemia without coma: Secondary | ICD-10-CM | POA: Diagnosis not present

## 2022-07-31 DIAGNOSIS — N179 Acute kidney failure, unspecified: Secondary | ICD-10-CM | POA: Diagnosis not present

## 2022-07-31 DIAGNOSIS — E1169 Type 2 diabetes mellitus with other specified complication: Secondary | ICD-10-CM | POA: Diagnosis not present

## 2022-07-31 DIAGNOSIS — Z9981 Dependence on supplemental oxygen: Secondary | ICD-10-CM | POA: Diagnosis not present

## 2022-08-01 ENCOUNTER — Telehealth (INDEPENDENT_AMBULATORY_CARE_PROVIDER_SITE_OTHER): Payer: Self-pay | Admitting: Nurse Practitioner

## 2022-08-01 NOTE — Telephone Encounter (Signed)
Dawn called and stated she needed a prescription for Shrinkers for patient second leg,  below amputee and wanted it faxed to [phone] and her phone # is [phone].  She states patient is there waiting. Please advise

## 2022-08-02 DIAGNOSIS — J9611 Chronic respiratory failure with hypoxia: Secondary | ICD-10-CM | POA: Diagnosis not present

## 2022-08-02 DIAGNOSIS — T8189XD Other complications of procedures, not elsewhere classified, subsequent encounter: Secondary | ICD-10-CM | POA: Diagnosis not present

## 2022-08-02 DIAGNOSIS — I5032 Chronic diastolic (congestive) heart failure: Secondary | ICD-10-CM | POA: Diagnosis not present

## 2022-08-02 DIAGNOSIS — M86171 Other acute osteomyelitis, right ankle and foot: Secondary | ICD-10-CM | POA: Diagnosis not present

## 2022-08-02 DIAGNOSIS — I110 Hypertensive heart disease with heart failure: Secondary | ICD-10-CM | POA: Diagnosis not present

## 2022-08-02 DIAGNOSIS — E1169 Type 2 diabetes mellitus with other specified complication: Secondary | ICD-10-CM | POA: Diagnosis not present

## 2022-08-02 DIAGNOSIS — E1151 Type 2 diabetes mellitus with diabetic peripheral angiopathy without gangrene: Secondary | ICD-10-CM | POA: Diagnosis not present

## 2022-08-02 DIAGNOSIS — N179 Acute kidney failure, unspecified: Secondary | ICD-10-CM | POA: Diagnosis not present

## 2022-08-02 DIAGNOSIS — I480 Paroxysmal atrial fibrillation: Secondary | ICD-10-CM | POA: Diagnosis not present

## 2022-08-02 DIAGNOSIS — E11649 Type 2 diabetes mellitus with hypoglycemia without coma: Secondary | ICD-10-CM | POA: Diagnosis not present

## 2022-08-02 DIAGNOSIS — G4733 Obstructive sleep apnea (adult) (pediatric): Secondary | ICD-10-CM | POA: Diagnosis not present

## 2022-08-02 DIAGNOSIS — J449 Chronic obstructive pulmonary disease, unspecified: Secondary | ICD-10-CM | POA: Diagnosis not present

## 2022-08-02 DIAGNOSIS — E039 Hypothyroidism, unspecified: Secondary | ICD-10-CM | POA: Diagnosis not present

## 2022-08-02 DIAGNOSIS — I2510 Atherosclerotic heart disease of native coronary artery without angina pectoris: Secondary | ICD-10-CM | POA: Diagnosis not present

## 2022-08-02 DIAGNOSIS — Z9981 Dependence on supplemental oxygen: Secondary | ICD-10-CM | POA: Diagnosis not present

## 2022-08-02 DIAGNOSIS — E669 Obesity, unspecified: Secondary | ICD-10-CM | POA: Diagnosis not present

## 2022-08-02 NOTE — Telephone Encounter (Signed)
On your desk

## 2022-08-02 NOTE — Telephone Encounter (Signed)
I spoke with [name] with [facility] and she confirmed that orders has been received

## 2022-08-03 NOTE — Telephone Encounter (Signed)
faxed

## 2022-08-06 DIAGNOSIS — E1151 Type 2 diabetes mellitus with diabetic peripheral angiopathy without gangrene: Secondary | ICD-10-CM | POA: Diagnosis not present

## 2022-08-06 DIAGNOSIS — I110 Hypertensive heart disease with heart failure: Secondary | ICD-10-CM | POA: Diagnosis not present

## 2022-08-06 DIAGNOSIS — J9611 Chronic respiratory failure with hypoxia: Secondary | ICD-10-CM | POA: Diagnosis not present

## 2022-08-06 DIAGNOSIS — T8189XD Other complications of procedures, not elsewhere classified, subsequent encounter: Secondary | ICD-10-CM | POA: Diagnosis not present

## 2022-08-06 DIAGNOSIS — I2510 Atherosclerotic heart disease of native coronary artery without angina pectoris: Secondary | ICD-10-CM | POA: Diagnosis not present

## 2022-08-06 DIAGNOSIS — Z9981 Dependence on supplemental oxygen: Secondary | ICD-10-CM | POA: Diagnosis not present

## 2022-08-06 DIAGNOSIS — E039 Hypothyroidism, unspecified: Secondary | ICD-10-CM | POA: Diagnosis not present

## 2022-08-06 DIAGNOSIS — N179 Acute kidney failure, unspecified: Secondary | ICD-10-CM | POA: Diagnosis not present

## 2022-08-06 DIAGNOSIS — I5032 Chronic diastolic (congestive) heart failure: Secondary | ICD-10-CM | POA: Diagnosis not present

## 2022-08-06 DIAGNOSIS — E669 Obesity, unspecified: Secondary | ICD-10-CM | POA: Diagnosis not present

## 2022-08-06 DIAGNOSIS — G4733 Obstructive sleep apnea (adult) (pediatric): Secondary | ICD-10-CM | POA: Diagnosis not present

## 2022-08-06 DIAGNOSIS — E11649 Type 2 diabetes mellitus with hypoglycemia without coma: Secondary | ICD-10-CM | POA: Diagnosis not present

## 2022-08-06 DIAGNOSIS — J449 Chronic obstructive pulmonary disease, unspecified: Secondary | ICD-10-CM | POA: Diagnosis not present

## 2022-08-06 DIAGNOSIS — M86171 Other acute osteomyelitis, right ankle and foot: Secondary | ICD-10-CM | POA: Diagnosis not present

## 2022-08-06 DIAGNOSIS — I480 Paroxysmal atrial fibrillation: Secondary | ICD-10-CM | POA: Diagnosis not present

## 2022-08-06 DIAGNOSIS — E1169 Type 2 diabetes mellitus with other specified complication: Secondary | ICD-10-CM | POA: Diagnosis not present

## 2022-08-07 DIAGNOSIS — N179 Acute kidney failure, unspecified: Secondary | ICD-10-CM | POA: Diagnosis not present

## 2022-08-07 DIAGNOSIS — T8189XD Other complications of procedures, not elsewhere classified, subsequent encounter: Secondary | ICD-10-CM | POA: Diagnosis not present

## 2022-08-07 DIAGNOSIS — M6281 Muscle weakness (generalized): Secondary | ICD-10-CM | POA: Diagnosis not present

## 2022-08-07 DIAGNOSIS — I110 Hypertensive heart disease with heart failure: Secondary | ICD-10-CM | POA: Diagnosis not present

## 2022-08-07 DIAGNOSIS — I480 Paroxysmal atrial fibrillation: Secondary | ICD-10-CM | POA: Diagnosis not present

## 2022-08-07 DIAGNOSIS — E669 Obesity, unspecified: Secondary | ICD-10-CM | POA: Diagnosis not present

## 2022-08-07 DIAGNOSIS — E1151 Type 2 diabetes mellitus with diabetic peripheral angiopathy without gangrene: Secondary | ICD-10-CM | POA: Diagnosis not present

## 2022-08-07 DIAGNOSIS — J9601 Acute respiratory failure with hypoxia: Secondary | ICD-10-CM | POA: Diagnosis not present

## 2022-08-07 DIAGNOSIS — E039 Hypothyroidism, unspecified: Secondary | ICD-10-CM | POA: Diagnosis not present

## 2022-08-07 DIAGNOSIS — J9611 Chronic respiratory failure with hypoxia: Secondary | ICD-10-CM | POA: Diagnosis not present

## 2022-08-07 DIAGNOSIS — Z4781 Encounter for orthopedic aftercare following surgical amputation: Secondary | ICD-10-CM | POA: Diagnosis not present

## 2022-08-07 DIAGNOSIS — I2510 Atherosclerotic heart disease of native coronary artery without angina pectoris: Secondary | ICD-10-CM | POA: Diagnosis not present

## 2022-08-07 DIAGNOSIS — I5032 Chronic diastolic (congestive) heart failure: Secondary | ICD-10-CM | POA: Diagnosis not present

## 2022-08-07 DIAGNOSIS — E1169 Type 2 diabetes mellitus with other specified complication: Secondary | ICD-10-CM | POA: Diagnosis not present

## 2022-08-07 DIAGNOSIS — E11649 Type 2 diabetes mellitus with hypoglycemia without coma: Secondary | ICD-10-CM | POA: Diagnosis not present

## 2022-08-07 DIAGNOSIS — J449 Chronic obstructive pulmonary disease, unspecified: Secondary | ICD-10-CM | POA: Diagnosis not present

## 2022-08-07 DIAGNOSIS — Z9981 Dependence on supplemental oxygen: Secondary | ICD-10-CM | POA: Diagnosis not present

## 2022-08-07 DIAGNOSIS — M86171 Other acute osteomyelitis, right ankle and foot: Secondary | ICD-10-CM | POA: Diagnosis not present

## 2022-08-07 DIAGNOSIS — G4733 Obstructive sleep apnea (adult) (pediatric): Secondary | ICD-10-CM | POA: Diagnosis not present

## 2022-08-08 ENCOUNTER — Encounter: Payer: Self-pay | Admitting: Family

## 2022-08-08 ENCOUNTER — Ambulatory Visit: Payer: Medicare Other | Attending: Family | Admitting: Family

## 2022-08-08 ENCOUNTER — Encounter: Payer: BC Managed Care – PPO | Admitting: Family

## 2022-08-08 VITALS — BP 130/70 | HR 85

## 2022-08-08 DIAGNOSIS — Z89512 Acquired absence of left leg below knee: Secondary | ICD-10-CM | POA: Insufficient documentation

## 2022-08-08 DIAGNOSIS — I4891 Unspecified atrial fibrillation: Secondary | ICD-10-CM | POA: Insufficient documentation

## 2022-08-08 DIAGNOSIS — Z89511 Acquired absence of right leg below knee: Secondary | ICD-10-CM | POA: Diagnosis not present

## 2022-08-08 DIAGNOSIS — I480 Paroxysmal atrial fibrillation: Secondary | ICD-10-CM | POA: Diagnosis not present

## 2022-08-08 DIAGNOSIS — E1151 Type 2 diabetes mellitus with diabetic peripheral angiopathy without gangrene: Secondary | ICD-10-CM | POA: Diagnosis not present

## 2022-08-08 DIAGNOSIS — E1159 Type 2 diabetes mellitus with other circulatory complications: Secondary | ICD-10-CM | POA: Diagnosis not present

## 2022-08-08 DIAGNOSIS — I10 Essential (primary) hypertension: Secondary | ICD-10-CM | POA: Diagnosis not present

## 2022-08-08 DIAGNOSIS — I890 Lymphedema, not elsewhere classified: Secondary | ICD-10-CM | POA: Insufficient documentation

## 2022-08-08 DIAGNOSIS — I5032 Chronic diastolic (congestive) heart failure: Secondary | ICD-10-CM | POA: Insufficient documentation

## 2022-08-08 DIAGNOSIS — Z79899 Other long term (current) drug therapy: Secondary | ICD-10-CM | POA: Diagnosis not present

## 2022-08-08 DIAGNOSIS — Z794 Long term (current) use of insulin: Secondary | ICD-10-CM | POA: Diagnosis not present

## 2022-08-08 DIAGNOSIS — E1142 Type 2 diabetes mellitus with diabetic polyneuropathy: Secondary | ICD-10-CM | POA: Diagnosis not present

## 2022-08-08 DIAGNOSIS — I110 Hypertensive heart disease with heart failure: Secondary | ICD-10-CM | POA: Diagnosis not present

## 2022-08-08 DIAGNOSIS — I739 Peripheral vascular disease, unspecified: Secondary | ICD-10-CM | POA: Diagnosis not present

## 2022-08-08 DIAGNOSIS — E1169 Type 2 diabetes mellitus with other specified complication: Secondary | ICD-10-CM | POA: Diagnosis not present

## 2022-08-08 NOTE — Patient Instructions (Signed)
Call us in the future if you need us for anything 

## 2022-08-08 NOTE — Progress Notes (Signed)
Patient ID: Alec Bender., male    DOB: 01/02/1956, 67 y.o.   MRN: [id number]  Primary cardiologist: Lorine Bears, MD (last seen 01/24) PCP: [name], MD (last seen 02/24; returns 05/24)  [name] is a 67 y/o male with a history of atrial fibrillation, asthma, CAD, DM, hyperlipidemia, HTN, depression, gout, hypogonadism, PVD, COPD, pleurisy, sleep apnea, previous tobacco use and chronic heart failure.   Echo 10/05/21: EF of 55-60% along with mild LAE and mild Alec. Echo 05/10/21: EF of 50-55% along with moderate LVH  LHC 02/02/20: Mid RCA lesion is 90% stenosed. Prox LAD to Mid LAD lesion is 100% stenosed. Mid Cx lesion is 100% stenosed. Origin to Prox Graft lesion is 100% stenosed.  LM normal LAD-100% proxiimal LCx-100% mid RCA-90% mid  Admitted 05/18/22 due to right ankle osteomyelitis. Underwent angiogram and angioplasty of the right anterior tibial artery with vascular surgery on 01/22. After consideration R BKA was done. Antibiotics finished. Unable to go to rehab due to insurance and cost. Was in the ED 03/27/22 due to abdominal pain. Dose of oral lasix given after CXR showed mild fluid.   He presents today for a HF follow up visit with a chief complaint of minimal fatigue with moderate exertion. Chronic in nature. Denies difficulty sleeping, dizziness, abdominal distention, palpitations, edema, chest pain, SOB or cough.   Had his right leg amputated since last here and says that the incision is almost healed up. He's hoping to get fitted for prosthesis soon.   Past Medical History:  Diagnosis Date   Arrhythmia    atrial fibrillation   Asthma    CHF (congestive heart failure) (HCC)    COPD (chronic obstructive pulmonary disease) (HCC)    Coronary artery disease    Depression    Diabetes mellitus without complication (HCC)    Gout    History anabolic steroid use    Hyperlipidemia    Hypertension    Hypogonadism in male    MI (myocardial infarction) (HCC)     Morbid obesity (HCC)    Myocardial infarction (HCC)    Peripheral vascular disease (HCC)    Perirectal abscess    Pleurisy    Sleep apnea    CPAP at night, no oxygen   Varicella    Past Surgical History:  Procedure Laterality Date   ABDOMINAL AORTIC ANEURYSM REPAIR     ACHILLES TENDON SURGERY Left 01/10/2021   Procedure: ACHILLES LENGTHENING/KIDNER;  Surgeon: Rosetta Posner, DPM;  Location: [facility];  Service: Podiatry;  Laterality: Left;   AMPUTATION Left 10/03/2021   Procedure: AMPUTATION BELOW KNEE;  Surgeon: Renford Dills, MD;  Location: [facility];  Service: Vascular;  Laterality: Left;   AMPUTATION Right 05/24/2022   Procedure: AMPUTATION BELOW KNEE;  Surgeon: Annice Needy, MD;  Location: [facility];  Service: General;  Laterality: Right;   AMPUTATION TOE Right 02/10/2016   Procedure: AMPUTATION TOE 3RD TOE;  Surgeon: Gwyneth Revels, DPM;  Location: [facility];  Service: Podiatry;  Laterality: Right;   AMPUTATION TOE Left 02/24/2020   Procedure: AMPUTATION TOE;  Surgeon: Rosetta Posner, DPM;  Location: [facility];  Service: Podiatry;  Laterality: Left;   APPLICATION OF WOUND VAC Left 02/29/2020   Procedure: APPLICATION OF WOUND VAC;  Surgeon: Rosetta Posner, DPM;  Location: [facility];  Service: Podiatry;  Laterality: Left;   CARDIAC CATHETERIZATION     CARDIOVERSION [name] 02/13/2022   Procedure: CARDIOVERSION;  Surgeon: Lamar Blinks, MD;  Location: [facility];  Service: Cardiovascular;  Laterality: [name];   COLONOSCOPY WITH PROPOFOL [name] 11/18/2015   Procedure: COLONOSCOPY WITH PROPOFOL;  Surgeon: Scot Jun, MD;  Location: [facility];  Service: Endoscopy;  Laterality: [name];   CORONARY ARTERY BYPASS GRAFT     CORONARY STENT INTERVENTION [name] 02/02/2020   Procedure: CORONARY STENT INTERVENTION;  Surgeon: Marcina Millard, MD;  Location: [facility];  Service: Cardiovascular;  Laterality: [name];   INCISION AND DRAINAGE Left 08/07/2021   Procedure: INCISION AND  DRAINAGE-Partial Calcanectomy;  Surgeon: Rosetta Posner, DPM;  Location: [facility];  Service: Podiatry;  Laterality: Left;   IRRIGATION AND DEBRIDEMENT FOOT Left 02/29/2020   Procedure: IRRIGATION AND DEBRIDEMENT FOOT;  Surgeon: Rosetta Posner, DPM;  Location: [facility];  Service: Podiatry;  Laterality: Left;   IRRIGATION AND DEBRIDEMENT FOOT Left 02/24/2020   Procedure: IRRIGATION AND DEBRIDEMENT FOOT;  Surgeon: Rosetta Posner, DPM;  Location: [facility];  Service: Podiatry;  Laterality: Left;   KNEE ARTHROSCOPY     LEFT HEART CATH AND CORS/GRAFTS ANGIOGRAPHY [name] 02/02/2020   Procedure: LEFT HEART CATH AND CORS/GRAFTS ANGIOGRAPHY;  Surgeon: Dalia Heading, MD;  Location: [facility];  Service: Cardiovascular;  Laterality: [name];   LOWER EXTREMITY ANGIOGRAPHY Left 02/25/2020   Procedure: Lower Extremity Angiography;  Surgeon: Annice Needy, MD;  Location: [facility];  Service: Cardiovascular;  Laterality: Left;   LOWER EXTREMITY ANGIOGRAPHY Left 01/04/2021   Procedure: LOWER EXTREMITY ANGIOGRAPHY;  Surgeon: Annice Needy, MD;  Location: [facility];  Service: Cardiovascular;  Laterality: Left;   LOWER EXTREMITY ANGIOGRAPHY Right 05/21/2022   Procedure: Lower Extremity Angiography;  Surgeon: Annice Needy, MD;  Location: [facility];  Service: Cardiovascular;  Laterality: Right;   METATARSAL HEAD EXCISION Left 01/10/2021   Procedure: METATARSAL HEAD EXCISION - LEFT 5th;  Surgeon: Rosetta Posner, DPM;  Location: [facility];  Service: Podiatry;  Laterality: Left;   PERIPHERAL VASCULAR CATHETERIZATION Right 01/24/2016   Procedure: Lower Extremity Angiography;  Surgeon: Renford Dills, MD;  Location: [facility];  Service: Cardiovascular;  Laterality: Right;   PERIPHERAL VASCULAR CATHETERIZATION Right 01/25/2016   Procedure: Lower Extremity Angiography;  Surgeon: Renford Dills, MD;  Location: [facility];  Service: Cardiovascular;  Laterality: Right;    TOE AMPUTATION     TONSILLECTOMY     Family History  Problem Relation Age of Onset   Hypertension Father    Coronary artery disease Father    Alcohol abuse Father    Heart failure Brother    Social History   Tobacco Use   Smoking status: Former    Packs/day: 0.50    Years: 45.00    Additional pack years: 0.00    Total pack years: 22.50    Types: Cigarettes    Quit date: 04/05/2015    Years since quitting: 7.3   Smokeless tobacco: Never  Substance Use Topics   Alcohol use: Not Currently    Alcohol/week: 3.0 standard drinks of alcohol    Types: 3 Glasses of wine per week   Allergies  Allergen Reactions   Penicillins Other (See Comments)    Happened at 67 years old and pt. stated he passed out  He has tolerated amoxicillin/clavulanate and ampicillin/sulbactam   Statins     Other reaction(s): Muscle Pain Causes legs to ache per pt   Metformin And Related Diarrhea   Prior to Admission medications   Medication Sig Start Date End Date Taking? Authorizing Provider  acetaminophen (TYLENOL) 325 MG tablet Take 2 tablets (650 mg total) by mouth every 6 (six) hours as needed for mild pain (or Fever >/= 101). 05/29/22  Yes Sunnie Nielsen, DO  amiodarone (PACERONE) 200 MG tablet Take 1 tablet (200 mg total) by mouth daily. 06/20/22  Yes [name], MD  apixaban (ELIQUIS) 5 MG TABS tablet Take 5 mg by mouth 2 (two) times daily.   Yes [provider]  aspirin EC 81 MG tablet Take 81 mg by mouth daily. Swallow whole.   Yes [provider]  budesonide (PULMICORT) 0.5 MG/2ML nebulizer solution Take 2 mLs (0.5 mg total) by nebulization 2 (two) times daily. 11/10/21  Yes Sunnie Nielsen, DO  Cholecalciferol 25 MCG (1000 UT) tablet Take 1,000 Units by mouth daily.   Yes [provider]  Continuous Blood Gluc Sensor (FREESTYLE LIBRE 3 SENSOR) MISC  06/13/22  Yes [provider]  Cyanocobalamin (VITAMIN B-12) 5000 MCG TBDP Take 10,000 mcg by mouth  daily.   Yes [provider]  diltiazem (CARDIZEM CD) 300 MG 24 hr capsule Take 1 capsule (300 mg total) by mouth daily. 06/21/22  Yes [name], MD  ezetimibe (ZETIA) 10 MG tablet Take 10 mg by mouth at bedtime.   Yes [provider]  ferrous sulfate 325 (65 FE) MG tablet Take 325 mg by mouth daily with breakfast.   Yes [provider]  finasteride (PROSCAR) 5 MG tablet Take 1 tablet (5 mg total) by mouth daily. 11/11/21  Yes Sunnie Nielsen, DO  furosemide (LASIX) 20 MG tablet Take 1 tablet (20 mg total) by mouth ONCE OR TWICE daily (total daily dose maximum 40 mg) as needed for increased swelling, shortness of breath, weight gain 5+ lbs over 1-2 days. Seek medical care if these symptoms are not improving with increased dose, or if you become dizzy or have low blood pressure. 05/29/22  Yes Sunnie Nielsen, DO  gabapentin (NEURONTIN) 300 MG capsule Take 300 mg by mouth at bedtime.   Yes [provider]  HUMALOG 100 UNIT/ML injection Inject 15 Units into the skin 3 (three) times daily with meals. >200 02/05/22  Yes [provider]  Infant Care Products [facility]) OINT Apply 1 Application topically daily as needed.   Yes [provider]  insulin glargine (LANTUS) 100 UNIT/ML injection Inject 15 units into the skin daily in AM and inject 25 units into the skin daily in PM 05/29/22  Yes Sunnie Nielsen, DO  isosorbide mononitrate (IMDUR) 60 MG 24 hr tablet Take 1 tablet (60 mg total) by mouth in the morning and at bedtime. 06/20/22  Yes [name], MD  levothyroxine (SYNTHROID) 25 MCG tablet Take 1 tablet (25 mcg total) by mouth daily at 6 (six) AM. 11/11/21  Yes Sunnie Nielsen, DO  losartan (COZAAR) 25 MG tablet Take 1 tablet (25 mg total) by mouth daily. 05/30/22  Yes Sunnie Nielsen, DO  Multiple Vitamins-Minerals (MULTIVITAMIN WITH MINERALS) tablet Take 1 tablet by mouth daily.   Yes [provider]   nitrofurantoin, macrocrystal-monohydrate, (MACROBID) 100 MG capsule Take 100 mg by mouth daily.   Yes [provider]  nitroGLYCERIN (NITROSTAT) 0.4 MG SL tablet Place 1 tablet (0.4 mg total) under the tongue every 5 (five) minutes as needed for chest pain. 06/20/22  Yes [name], MD  nutrition supplement, JUVEN, (JUVEN) PACK Take 1 packet by mouth 2 (two) times daily between meals. 05/29/22  Yes Sunnie Nielsen, DO  nystatin (MYCOSTATIN/NYSTOP) powder Apply  1 Application topically 2 (two) times daily.   Yes [provider]  ondansetron (ZOFRAN) 4 MG tablet Take 1 tablet (4 mg total) by mouth every 6 (six) hours as needed for nausea. 05/29/22  Yes Sunnie NielsenAlexander, Natalie, DO  pramipexole (MIRAPEX) 1 MG tablet Take 2 mg by mouth at bedtime.   Yes [provider]  primidone (MYSOLINE) 250 MG tablet Take 250 mg by mouth 2 (two) times daily. 01/23/20  Yes [provider]  risperiDONE (RISPERDAL) 0.5 MG tablet Take 0.5 tablets (0.25 mg total) by mouth 2 (two) times daily. 05/29/22  Yes Sunnie NielsenAlexander, Natalie, DO  tamsulosin (FLOMAX) 0.4 MG CAPS capsule Take 0.4 mg by mouth daily.   Yes [provider]  vitamin C (ASCORBIC ACID) 500 MG tablet Take 500 mg by mouth daily.   Yes [provider]  zinc gluconate 50 MG tablet Take 50 mg by mouth daily.   Yes [provider]  zinc sulfate 220 (50 Zn) MG capsule Take 1 capsule (220 mg total) by mouth daily. 05/29/22  Yes Sunnie NielsenAlexander, Natalie, DO   Review of Systems  Constitutional:  Positive for fatigue. Negative for appetite change.  HENT:  Negative for congestion, postnasal drip and rhinorrhea.   Eyes: Negative.   Respiratory:  Negative for cough, chest tightness and shortness of breath.   Cardiovascular:  Negative for chest pain, palpitations and leg swelling.  Gastrointestinal:  Negative for abdominal distention and abdominal pain.  Endocrine: Negative.   Genitourinary: Negative.    Musculoskeletal:  Negative for arthralgias and back pain.  Skin: Negative.   Allergic/Immunologic: Negative.   Neurological:  Negative for dizziness, tremors and light-headedness.  Hematological:  Negative for adenopathy. Bruises/bleeds easily.  Psychiatric/Behavioral:  Negative for dysphoric mood and sleep disturbance (wearing oxygen @ 2L; CPAP inconsistently). The patient is not nervous/anxious.    Vitals:   08/08/22 1453  BP: 130/70  Pulse: 85  SpO2: 98%   Wt Readings from Last 3 Encounters:  05/18/22 298 lb 12.8 oz (135.5 kg)  04/05/22 (!) 303 lb (137.4 kg)  04/04/22 (!) 310 lb (140.6 kg)   Lab Results  Component Value Date   CREATININE 0.89 06/06/2022   CREATININE 0.79 06/05/2022   CREATININE 0.74 06/04/2022   Physical Exam Vitals and nursing note reviewed.  Constitutional:      Appearance: Normal appearance. He is obese.  HENT:     Head: Normocephalic and atraumatic.  Cardiovascular:     Rate and Rhythm: Normal rate and regular rhythm.  Pulmonary:     Effort: No respiratory distress.     Breath sounds: No wheezing or rales.  Abdominal:     General: There is no distension.     Palpations: Abdomen is soft.     Tenderness: There is no abdominal tenderness.  Musculoskeletal:        General: Deformity (bilateral BKA) present.     Cervical back: Normal range of motion.  Skin:    General: Skin is warm and dry.  Neurological:     General: No focal deficit present.     Mental Status: He is alert and oriented to person, place, and time.  Psychiatric:        Mood and Affect: Mood normal.        Behavior: Behavior normal.        Thought Content: Thought content normal.   Assessment & Plan:  1: Chronic heart failure with preserved ejection fraction- - NYHA class II - euvolemic today - currently not  weighing daily due to bilateral BKA's - Echo 10/05/21: EF of 55-60% along with mild LAE and mild Alec. Echo 05/10/21: EF of 50-55% along with moderate LVH - LHC 02/02/20:   Mid RCA lesion is 90% stenosed. Prox LAD to Mid LAD lesion is 100% stenosed. Mid Cx lesion is 100% stenosed. Origin to Prox Graft lesion is 100% stenosed.  LM normal, LAD-100% proxiimal, LCx-100% mid, RCA-90% mid - receiving PT/ OT  - may not be a good candidate for SGLT2 due to body habitus and frequent fungal infections around abdominal folds  - using pepper, Mrs Sharilyn Sites, garlic powder etc on his food - wearing oxygen at 2L at bedtime; wearing CPAP inconsistently - BNP 03/16/22 was 205.4  2: HTN- - BP 130/70 - saw PCP ([place]) 02/24 - BMP 06/21/22 reviewed and showed sodium 140, potassium 4.6, creatinine 0.9 & GFR 94  3: DM- - saw endocrinology Gershon Crane) 04/05/22 - A1c 06/21/22 was 8.7% - saw podiatry Excell Seltzer) 05/04/22  4: Atrial fibrillation- - saw cardiology Kirke Corin) 01/24 - regular rhythm today  5: PAD/ lymphedema- - had left BKA June 2023, right BKA 01/24 - saw vascular ([facility]) 03/24  Due to HF stability, will not make a return appointment at this time. Advised to follow closely with cardiology/ PCP but that he could call or return at anytime. Patient was comfortable with this plan.

## 2022-08-09 ENCOUNTER — Encounter: Payer: BC Managed Care – PPO | Admitting: Family

## 2022-08-10 DIAGNOSIS — E039 Hypothyroidism, unspecified: Secondary | ICD-10-CM | POA: Diagnosis not present

## 2022-08-10 DIAGNOSIS — T8189XD Other complications of procedures, not elsewhere classified, subsequent encounter: Secondary | ICD-10-CM | POA: Diagnosis not present

## 2022-08-10 DIAGNOSIS — M86171 Other acute osteomyelitis, right ankle and foot: Secondary | ICD-10-CM | POA: Diagnosis not present

## 2022-08-10 DIAGNOSIS — Z9981 Dependence on supplemental oxygen: Secondary | ICD-10-CM | POA: Diagnosis not present

## 2022-08-10 DIAGNOSIS — E11649 Type 2 diabetes mellitus with hypoglycemia without coma: Secondary | ICD-10-CM | POA: Diagnosis not present

## 2022-08-10 DIAGNOSIS — I480 Paroxysmal atrial fibrillation: Secondary | ICD-10-CM | POA: Diagnosis not present

## 2022-08-10 DIAGNOSIS — I110 Hypertensive heart disease with heart failure: Secondary | ICD-10-CM | POA: Diagnosis not present

## 2022-08-10 DIAGNOSIS — N179 Acute kidney failure, unspecified: Secondary | ICD-10-CM | POA: Diagnosis not present

## 2022-08-10 DIAGNOSIS — E1169 Type 2 diabetes mellitus with other specified complication: Secondary | ICD-10-CM | POA: Diagnosis not present

## 2022-08-10 DIAGNOSIS — G4733 Obstructive sleep apnea (adult) (pediatric): Secondary | ICD-10-CM | POA: Diagnosis not present

## 2022-08-10 DIAGNOSIS — I5032 Chronic diastolic (congestive) heart failure: Secondary | ICD-10-CM | POA: Diagnosis not present

## 2022-08-10 DIAGNOSIS — J9611 Chronic respiratory failure with hypoxia: Secondary | ICD-10-CM | POA: Diagnosis not present

## 2022-08-10 DIAGNOSIS — E1151 Type 2 diabetes mellitus with diabetic peripheral angiopathy without gangrene: Secondary | ICD-10-CM | POA: Diagnosis not present

## 2022-08-10 DIAGNOSIS — J449 Chronic obstructive pulmonary disease, unspecified: Secondary | ICD-10-CM | POA: Diagnosis not present

## 2022-08-10 DIAGNOSIS — I2510 Atherosclerotic heart disease of native coronary artery without angina pectoris: Secondary | ICD-10-CM | POA: Diagnosis not present

## 2022-08-10 DIAGNOSIS — E669 Obesity, unspecified: Secondary | ICD-10-CM | POA: Diagnosis not present

## 2022-08-13 ENCOUNTER — Ambulatory Visit
Admission: RE | Admit: 2022-08-13 | Discharge: 2022-08-13 | Disposition: A | Discharge status: Home / Self Care | Payer: Medicare Other | Source: Ambulatory Visit | Attending: Urology | Admitting: Urology

## 2022-08-13 DIAGNOSIS — E039 Hypothyroidism, unspecified: Secondary | ICD-10-CM | POA: Diagnosis not present

## 2022-08-13 DIAGNOSIS — I700 Atherosclerosis of aorta: Secondary | ICD-10-CM | POA: Insufficient documentation

## 2022-08-13 DIAGNOSIS — Y929 Unspecified place or not applicable: Secondary | ICD-10-CM | POA: Diagnosis not present

## 2022-08-13 DIAGNOSIS — Z9981 Dependence on supplemental oxygen: Secondary | ICD-10-CM | POA: Diagnosis not present

## 2022-08-13 DIAGNOSIS — E11649 Type 2 diabetes mellitus with hypoglycemia without coma: Secondary | ICD-10-CM | POA: Diagnosis not present

## 2022-08-13 DIAGNOSIS — I110 Hypertensive heart disease with heart failure: Secondary | ICD-10-CM | POA: Diagnosis not present

## 2022-08-13 DIAGNOSIS — E1169 Type 2 diabetes mellitus with other specified complication: Secondary | ICD-10-CM | POA: Diagnosis not present

## 2022-08-13 DIAGNOSIS — G4733 Obstructive sleep apnea (adult) (pediatric): Secondary | ICD-10-CM | POA: Diagnosis not present

## 2022-08-13 DIAGNOSIS — T8189XD Other complications of procedures, not elsewhere classified, subsequent encounter: Secondary | ICD-10-CM | POA: Diagnosis not present

## 2022-08-13 DIAGNOSIS — K449 Diaphragmatic hernia without obstruction or gangrene: Secondary | ICD-10-CM | POA: Diagnosis not present

## 2022-08-13 DIAGNOSIS — S37019A Minor contusion of unspecified kidney, initial encounter: Secondary | ICD-10-CM | POA: Insufficient documentation

## 2022-08-13 DIAGNOSIS — K5730 Diverticulosis of large intestine without perforation or abscess without bleeding: Secondary | ICD-10-CM | POA: Insufficient documentation

## 2022-08-13 DIAGNOSIS — I10 Essential (primary) hypertension: Secondary | ICD-10-CM | POA: Diagnosis not present

## 2022-08-13 DIAGNOSIS — I2510 Atherosclerotic heart disease of native coronary artery without angina pectoris: Secondary | ICD-10-CM | POA: Diagnosis not present

## 2022-08-13 DIAGNOSIS — E1151 Type 2 diabetes mellitus with diabetic peripheral angiopathy without gangrene: Secondary | ICD-10-CM | POA: Diagnosis not present

## 2022-08-13 DIAGNOSIS — J9611 Chronic respiratory failure with hypoxia: Secondary | ICD-10-CM | POA: Diagnosis not present

## 2022-08-13 DIAGNOSIS — R109 Unspecified abdominal pain: Secondary | ICD-10-CM | POA: Diagnosis not present

## 2022-08-13 DIAGNOSIS — J449 Chronic obstructive pulmonary disease, unspecified: Secondary | ICD-10-CM | POA: Diagnosis not present

## 2022-08-13 DIAGNOSIS — M86171 Other acute osteomyelitis, right ankle and foot: Secondary | ICD-10-CM | POA: Diagnosis not present

## 2022-08-13 DIAGNOSIS — Y939 Activity, unspecified: Secondary | ICD-10-CM | POA: Insufficient documentation

## 2022-08-13 DIAGNOSIS — I5032 Chronic diastolic (congestive) heart failure: Secondary | ICD-10-CM | POA: Diagnosis not present

## 2022-08-13 DIAGNOSIS — E669 Obesity, unspecified: Secondary | ICD-10-CM | POA: Diagnosis not present

## 2022-08-13 DIAGNOSIS — N179 Acute kidney failure, unspecified: Secondary | ICD-10-CM | POA: Diagnosis not present

## 2022-08-13 DIAGNOSIS — I480 Paroxysmal atrial fibrillation: Secondary | ICD-10-CM | POA: Diagnosis not present

## 2022-08-13 LAB — POCT I-STAT CREATININE: Creatinine, Ser: 1.9 mg/dL — ABNORMAL HIGH (ref 0.61–1.24)

## 2022-08-13 MED ORDER — IOHEXOL 300 MG/ML  SOLN
100.0000 mL | Freq: Once | INTRAMUSCULAR | Status: AC | PRN
  Administered 2022-08-13: 75 mL via INTRAVENOUS

## 2022-08-15 ENCOUNTER — Ambulatory Visit: Payer: Medicare Other | Admitting: Urology

## 2022-08-15 ENCOUNTER — Encounter: Payer: Self-pay | Admitting: Urology

## 2022-08-15 VITALS — BP 124/72 | HR 96 | Ht 69.0 in | Wt 299.0 lb

## 2022-08-15 DIAGNOSIS — N12 Tubulo-interstitial nephritis, not specified as acute or chronic: Secondary | ICD-10-CM

## 2022-08-15 DIAGNOSIS — S37019A Minor contusion of unspecified kidney, initial encounter: Secondary | ICD-10-CM

## 2022-08-15 NOTE — Progress Notes (Signed)
I, [name],acting as a scribe for Vanna Scotland, MD.,have documented all relevant documentation on the behalf of Vanna Scotland, MD,as directed by  Vanna Scotland, MD while in the presence of Vanna Scotland, MD.  08/15/2022 5:32 PM   [name]. Nov 02, 1955 [id number]  Referring provider: [name], MD [address] [facility] [place],  [place] [address]  Chief Complaint  Patient presents with   Follow-up    HPI: 67 year-old male returns today for follow-up.  He was last seen in January having initially presented to the emergency room in December with a very large subcapsular hematoma. It measured at least 9.5 by 5.2 centimeters along with gas in the collecting system which later necessitated into the retroperitoneum. He's been followed up with serial imaging. In the interim his hemoglobin has remained stable.   He follows up today with a repeat CT abdomen pelvis, which is four months after his previous. The CT from 08/13/2022 shows marked improvement of his perinephric hematoma with resolution of most, but not all, of the gas in his kidney.   He is doing better and has lost weight. He has been working out with [employer]. He is trying to get adjusted to the wheelchair. He goes next month to get measured for his leg prosthesis.    PMH: Past Medical History:  Diagnosis Date   Arrhythmia    atrial fibrillation   Asthma    CHF (congestive heart failure)    COPD (chronic obstructive pulmonary disease)    Coronary artery disease    Depression    Diabetes mellitus without complication    Gout    History anabolic steroid use    Hyperlipidemia    Hypertension    Hypogonadism in male    MI (myocardial infarction)    Morbid obesity    Myocardial infarction    Peripheral vascular disease    Perirectal abscess    Pleurisy    Sleep apnea    CPAP at night, no oxygen   Varicella     Surgical History: Past Surgical History:  Procedure  Laterality Date   ABDOMINAL AORTIC ANEURYSM REPAIR     ACHILLES TENDON SURGERY Left 01/10/2021   Procedure: ACHILLES LENGTHENING/KIDNER;  Surgeon: Rosetta Posner, DPM;  Location: [facility];  Service: Podiatry;  Laterality: Left;   AMPUTATION Left 10/03/2021   Procedure: AMPUTATION BELOW KNEE;  Surgeon: Renford Dills, MD;  Location: [facility];  Service: Vascular;  Laterality: Left;   AMPUTATION Right 05/24/2022   Procedure: AMPUTATION BELOW KNEE;  Surgeon: Annice Needy, MD;  Location: [facility];  Service: General;  Laterality: Right;   AMPUTATION TOE Right 02/10/2016   Procedure: AMPUTATION TOE 3RD TOE;  Surgeon: Gwyneth Revels, DPM;  Location: [facility];  Service: Podiatry;  Laterality: Right;   AMPUTATION TOE Left 02/24/2020   Procedure: AMPUTATION TOE;  Surgeon: Rosetta Posner, DPM;  Location: [facility];  Service: Podiatry;  Laterality: Left;   APPLICATION OF WOUND VAC Left 02/29/2020   Procedure: APPLICATION OF WOUND VAC;  Surgeon: Rosetta Posner, DPM;  Location: [facility];  Service: Podiatry;  Laterality: Left;   CARDIAC CATHETERIZATION     CARDIOVERSION [name] 02/13/2022   Procedure: CARDIOVERSION;  Surgeon: Lamar Blinks, MD;  Location: [facility];  Service: Cardiovascular;  Laterality: [name];   COLONOSCOPY WITH PROPOFOL [name] 11/18/2015   Procedure: COLONOSCOPY WITH PROPOFOL;  Surgeon: Scot Jun, MD;  Location: [facility];  Service: Endoscopy;  Laterality: [name];  CORONARY ARTERY BYPASS GRAFT     CORONARY STENT INTERVENTION [name] 02/02/2020   Procedure: CORONARY STENT INTERVENTION;  Surgeon: Marcina Millard, MD;  Location: [facility];  Service: Cardiovascular;  Laterality: [name];   INCISION AND DRAINAGE Left 08/07/2021   Procedure: INCISION AND DRAINAGE-Partial Calcanectomy;  Surgeon: Rosetta Posner, DPM;  Location: [facility];  Service: Podiatry;  Laterality: Left;   IRRIGATION AND DEBRIDEMENT FOOT Left 02/29/2020   Procedure: IRRIGATION AND DEBRIDEMENT FOOT;  Surgeon: Rosetta Posner, DPM;  Location: [facility];  Service: Podiatry;  Laterality: Left;   IRRIGATION AND DEBRIDEMENT FOOT Left 02/24/2020   Procedure: IRRIGATION AND DEBRIDEMENT FOOT;  Surgeon: Rosetta Posner, DPM;  Location: [facility];  Service: Podiatry;  Laterality: Left;   KNEE ARTHROSCOPY     LEFT HEART CATH AND CORS/GRAFTS ANGIOGRAPHY [name] 02/02/2020   Procedure: LEFT HEART CATH AND CORS/GRAFTS ANGIOGRAPHY;  Surgeon: Dalia Heading, MD;  Location: [facility];  Service: Cardiovascular;  Laterality: [name];   LOWER EXTREMITY ANGIOGRAPHY Left 02/25/2020   Procedure: Lower Extremity Angiography;  Surgeon: Annice Needy, MD;  Location: [facility];  Service: Cardiovascular;  Laterality: Left;   LOWER EXTREMITY ANGIOGRAPHY Left 01/04/2021   Procedure: LOWER EXTREMITY ANGIOGRAPHY;  Surgeon: Annice Needy, MD;  Location: [facility];  Service: Cardiovascular;  Laterality: Left;   LOWER EXTREMITY ANGIOGRAPHY Right 05/21/2022   Procedure: Lower Extremity Angiography;  Surgeon: Annice Needy, MD;  Location: [facility];  Service: Cardiovascular;  Laterality: Right;   METATARSAL HEAD EXCISION Left 01/10/2021   Procedure: METATARSAL HEAD EXCISION - LEFT 5th;  Surgeon: Rosetta Posner, DPM;  Location: [facility];  Service: Podiatry;  Laterality: Left;   PERIPHERAL VASCULAR CATHETERIZATION Right 01/24/2016   Procedure: Lower Extremity Angiography;  Surgeon: Renford Dills, MD;  Location: [facility];  Service: Cardiovascular;  Laterality: Right;   PERIPHERAL VASCULAR CATHETERIZATION Right 01/25/2016   Procedure: Lower Extremity Angiography;  Surgeon: Renford Dills, MD;  Location: [facility];  Service: Cardiovascular;  Laterality: Right;   TOE AMPUTATION     TONSILLECTOMY      Home Medications:  Allergies as of 08/15/2022       Reactions   Penicillins Other (See Comments)   Happened at 67 years old and pt. stated he passed out  He has tolerated  amoxicillin/clavulanate and ampicillin/sulbactam   Statins    Other reaction(s): Muscle Pain Causes legs to ache per pt   Metformin And Related Diarrhea        Medication List        Accurate as of August 15, 2022  5:32 PM. If you have any questions, ask your nurse or doctor.          acetaminophen 325 MG tablet Commonly known as: TYLENOL Take 2 tablets (650 mg total) by mouth every 6 (six) hours as needed for mild pain (or Fever >/= 101).   amiodarone 200 MG tablet Commonly known as: PACERONE Take 1 tablet (200 mg total) by mouth daily.   ascorbic acid 500 MG tablet Commonly known as: VITAMIN C Take 500 mg by mouth daily.   aspirin EC 81 MG tablet Take 81 mg by mouth daily. Swallow whole.   budesonide 0.5 MG/2ML nebulizer solution Commonly known as: PULMICORT Take 2 mLs (0.5 mg total) by nebulization 2 (two) times daily.   Cholecalciferol 25 MCG (1000 UT) tablet Take 1,000 Units by mouth daily.   Dermacloud Oint Apply 1  Application topically daily as needed.   diltiazem 300 MG 24 hr capsule Commonly known as: CARDIZEM CD Take 1 capsule (300 mg total) by mouth daily.   Eliquis 5 MG Tabs tablet Generic drug: apixaban Take 5 mg by mouth 2 (two) times daily.   ezetimibe 10 MG tablet Commonly known as: ZETIA Take 10 mg by mouth at bedtime.   ferrous sulfate 325 (65 FE) MG tablet Take 325 mg by mouth daily with breakfast.   finasteride 5 MG tablet Commonly known as: PROSCAR Take 1 tablet (5 mg total) by mouth daily.   FreeStyle Libre 3 Sensor Misc   furosemide 20 MG tablet Commonly known as: LASIX Take 1 tablet (20 mg total) by mouth ONCE OR TWICE daily (total daily dose maximum 40 mg) as needed for increased swelling, shortness of breath, weight gain 5+ lbs over 1-2 days. Seek medical care if these symptoms are not improving with increased dose, or if you become dizzy or have low blood pressure.   gabapentin 300 MG capsule Commonly known as:  NEURONTIN Take 300 mg by mouth at bedtime.   HumaLOG 100 UNIT/ML injection Generic drug: insulin lispro Inject 15 Units into the skin 3 (three) times daily with meals. >200   insulin glargine 100 UNIT/ML injection Commonly known as: LANTUS Inject 15 units into the skin daily in AM and inject 25 units into the skin daily in PM   isosorbide mononitrate 60 MG 24 hr tablet Commonly known as: IMDUR Take 1 tablet (60 mg total) by mouth in the morning and at bedtime.   levothyroxine 25 MCG tablet Commonly known as: SYNTHROID Take 1 tablet (25 mcg total) by mouth daily at 6 (six) AM.   losartan 25 MG tablet Commonly known as: COZAAR Take 1 tablet (25 mg total) by mouth daily.   multivitamin with minerals tablet Take 1 tablet by mouth daily.   nitrofurantoin (macrocrystal-monohydrate) 100 MG capsule Commonly known as: MACROBID Take 100 mg by mouth daily.   nitroGLYCERIN 0.4 MG SL tablet Commonly known as: NITROSTAT Place 1 tablet (0.4 mg total) under the tongue every 5 (five) minutes as needed for chest pain.   nutrition supplement (JUVEN) Pack Take 1 packet by mouth 2 (two) times daily between meals.   nystatin powder Commonly known as: MYCOSTATIN/NYSTOP Apply 1 Application topically 2 (two) times daily.   ondansetron 4 MG tablet Commonly known as: ZOFRAN Take 1 tablet (4 mg total) by mouth every 6 (six) hours as needed for nausea.   pramipexole 1 MG tablet Commonly known as: MIRAPEX Take 2 mg by mouth at bedtime.   primidone 250 MG tablet Commonly known as: MYSOLINE Take 250 mg by mouth 2 (two) times daily.   risperiDONE 0.5 MG tablet Commonly known as: RISPERDAL Take 0.5 tablets (0.25 mg total) by mouth 2 (two) times daily.   tamsulosin 0.4 MG Caps capsule Commonly known as: FLOMAX Take 0.4 mg by mouth daily.   Vitamin B-12 5000 MCG Tbdp Take 10,000 mcg by mouth daily.   zinc gluconate 50 MG tablet Take 50 mg by mouth daily.   zinc sulfate 220 (50 Zn) MG  capsule Take 1 capsule (220 mg total) by mouth daily.        Allergies:  Allergies  Allergen Reactions   Penicillins Other (See Comments)    Happened at 67 years old and pt. stated he passed out  He has tolerated amoxicillin/clavulanate and ampicillin/sulbactam   Statins     Other reaction(s): Muscle Pain Causes  legs to ache per pt   Metformin And Related Diarrhea    Family History: Family History  Problem Relation Age of Onset   Hypertension Father    Coronary artery disease Father    Alcohol abuse Father    Heart failure Brother     Social History:  reports that he quit smoking about 7 years ago. His smoking use included cigarettes. He has a 22.50 pack-year smoking history. He has never used smokeless tobacco. He reports that he does not currently use alcohol after a past usage of about 3.0 standard drinks of alcohol per week. He reports that he does not use drugs.   Physical Exam: BP 124/72   Pulse 96   Ht  (1.753 m)   Wt 299 lb (135.6 kg)   BMI 44.15 kg/m   Constitutional:  Alert and oriented, No acute distress. HEENT: [place] AT, moist mucus membranes.  Trachea midline, no masses. Neurologic: Grossly intact, no focal deficits, moving all 4 extremities. Psychiatric: Normal mood and affect.  Pertinent Imaging: Narrative & Impression  CLINICAL DATA:  Left lower quadrant pain. Recent right renal parenchymal laceration and retroperitoneal hematoma.   EXAM: CT ABDOMEN AND PELVIS WITH CONTRAST   TECHNIQUE: Multidetector CT imaging of the abdomen and pelvis was performed using the standard protocol following bolus administration of intravenous contrast.   RADIATION DOSE REDUCTION: This exam was performed according to the departmental dose-optimization program which includes automated exposure control, adjustment of the mA and/or kV according to patient size and/or use of iterative reconstruction technique.   CONTRAST:  75mL OMNIPAQUE IOHEXOL 300 MG/ML  SOLN    COMPARISON:  05/02/2022   FINDINGS: Lower Chest: No acute findings.   Hepatobiliary: No hepatic masses identified. Gallbladder is unremarkable. No evidence of biliary ductal dilatation.   Pancreas:  No mass or inflammatory changes.   Spleen: Within normal limits in size and appearance.   Adrenals/Urinary Tract: Previously seen right renal subcapsular fluid and gas collection has nearly completely resolved and no longer contains gas. Thickening of the right perirenal fascia persists, likely due to scarring. Mild right renal parenchymal scarring is noted. A few small bilateral renal cysts are again noted (no followup imaging is recommended). No suspicious renal masses are identified. No evidence of ureteral calculi or hydronephrosis. Unremarkable unopacified urinary bladder.   Stomach/Bowel: Stable small hiatal hernia. No evidence of obstruction, inflammatory process or abnormal fluid collections. Diverticulosis is seen mainly involving the descending and sigmoid colon, however there is no evidence of diverticulitis.   Vascular/Lymphatic: No pathologically enlarged lymph nodes. No acute vascular findings. Aortic atherosclerotic calcification incidentally noted.   Reproductive:  No mass or other significant abnormality.   Other:  None.   Musculoskeletal:  No suspicious bone lesions identified.   IMPRESSION: No acute findings.   Near complete resolution of right renal subcapsular fluid and gas collection since prior study.   Colonic diverticulosis, without radiographic evidence of diverticulitis.   Stable small hiatal hernia.   Aortic Atherosclerosis (ICD10-I70.0).     Electronically Signed   By: Danae Orleans M.D.   On: 08/15/2022 09:01   Personally reviewed today and agree with radiologic interpretation.   Assessment & Plan:    Right perinephric hematoma  - Back pain is lessening and he is working on lifestyle changes to loose weight for better health. -H&H  stable  - Will have him follow up in a year with another repeat CT scan to make sure that it's completely resolved and no underlying  masses  Return in about 1 year (around 08/15/2023) for CT abdomen with contrast.  I have reviewed the above documentation for accuracy and completeness, and I agree with the above.   Vanna Scotland, MD   [facility] [address], [address] [place], [place] [address] [phone]

## 2022-08-16 DIAGNOSIS — E1151 Type 2 diabetes mellitus with diabetic peripheral angiopathy without gangrene: Secondary | ICD-10-CM | POA: Diagnosis not present

## 2022-08-16 DIAGNOSIS — E11649 Type 2 diabetes mellitus with hypoglycemia without coma: Secondary | ICD-10-CM | POA: Diagnosis not present

## 2022-08-16 DIAGNOSIS — E1169 Type 2 diabetes mellitus with other specified complication: Secondary | ICD-10-CM | POA: Diagnosis not present

## 2022-08-16 DIAGNOSIS — Z9981 Dependence on supplemental oxygen: Secondary | ICD-10-CM | POA: Diagnosis not present

## 2022-08-16 DIAGNOSIS — N179 Acute kidney failure, unspecified: Secondary | ICD-10-CM | POA: Diagnosis not present

## 2022-08-16 DIAGNOSIS — J9611 Chronic respiratory failure with hypoxia: Secondary | ICD-10-CM | POA: Diagnosis not present

## 2022-08-16 DIAGNOSIS — J449 Chronic obstructive pulmonary disease, unspecified: Secondary | ICD-10-CM | POA: Diagnosis not present

## 2022-08-16 DIAGNOSIS — E669 Obesity, unspecified: Secondary | ICD-10-CM | POA: Diagnosis not present

## 2022-08-16 DIAGNOSIS — I480 Paroxysmal atrial fibrillation: Secondary | ICD-10-CM | POA: Diagnosis not present

## 2022-08-16 DIAGNOSIS — M86171 Other acute osteomyelitis, right ankle and foot: Secondary | ICD-10-CM | POA: Diagnosis not present

## 2022-08-16 DIAGNOSIS — I2510 Atherosclerotic heart disease of native coronary artery without angina pectoris: Secondary | ICD-10-CM | POA: Diagnosis not present

## 2022-08-16 DIAGNOSIS — I110 Hypertensive heart disease with heart failure: Secondary | ICD-10-CM | POA: Diagnosis not present

## 2022-08-16 DIAGNOSIS — I5032 Chronic diastolic (congestive) heart failure: Secondary | ICD-10-CM | POA: Diagnosis not present

## 2022-08-16 DIAGNOSIS — T8189XD Other complications of procedures, not elsewhere classified, subsequent encounter: Secondary | ICD-10-CM | POA: Diagnosis not present

## 2022-08-16 DIAGNOSIS — E039 Hypothyroidism, unspecified: Secondary | ICD-10-CM | POA: Diagnosis not present

## 2022-08-16 DIAGNOSIS — G4733 Obstructive sleep apnea (adult) (pediatric): Secondary | ICD-10-CM | POA: Diagnosis not present

## 2022-08-17 DIAGNOSIS — I480 Paroxysmal atrial fibrillation: Secondary | ICD-10-CM | POA: Diagnosis not present

## 2022-08-17 DIAGNOSIS — E11649 Type 2 diabetes mellitus with hypoglycemia without coma: Secondary | ICD-10-CM | POA: Diagnosis not present

## 2022-08-17 DIAGNOSIS — I110 Hypertensive heart disease with heart failure: Secondary | ICD-10-CM | POA: Diagnosis not present

## 2022-08-17 DIAGNOSIS — T8189XD Other complications of procedures, not elsewhere classified, subsequent encounter: Secondary | ICD-10-CM | POA: Diagnosis not present

## 2022-08-17 DIAGNOSIS — N179 Acute kidney failure, unspecified: Secondary | ICD-10-CM | POA: Diagnosis not present

## 2022-08-17 DIAGNOSIS — I2510 Atherosclerotic heart disease of native coronary artery without angina pectoris: Secondary | ICD-10-CM | POA: Diagnosis not present

## 2022-08-17 DIAGNOSIS — Z9981 Dependence on supplemental oxygen: Secondary | ICD-10-CM | POA: Diagnosis not present

## 2022-08-17 DIAGNOSIS — J449 Chronic obstructive pulmonary disease, unspecified: Secondary | ICD-10-CM | POA: Diagnosis not present

## 2022-08-17 DIAGNOSIS — G4733 Obstructive sleep apnea (adult) (pediatric): Secondary | ICD-10-CM | POA: Diagnosis not present

## 2022-08-17 DIAGNOSIS — J9611 Chronic respiratory failure with hypoxia: Secondary | ICD-10-CM | POA: Diagnosis not present

## 2022-08-17 DIAGNOSIS — I5032 Chronic diastolic (congestive) heart failure: Secondary | ICD-10-CM | POA: Diagnosis not present

## 2022-08-17 DIAGNOSIS — E1169 Type 2 diabetes mellitus with other specified complication: Secondary | ICD-10-CM | POA: Diagnosis not present

## 2022-08-17 DIAGNOSIS — E669 Obesity, unspecified: Secondary | ICD-10-CM | POA: Diagnosis not present

## 2022-08-17 DIAGNOSIS — M86171 Other acute osteomyelitis, right ankle and foot: Secondary | ICD-10-CM | POA: Diagnosis not present

## 2022-08-17 DIAGNOSIS — E1151 Type 2 diabetes mellitus with diabetic peripheral angiopathy without gangrene: Secondary | ICD-10-CM | POA: Diagnosis not present

## 2022-08-17 DIAGNOSIS — E039 Hypothyroidism, unspecified: Secondary | ICD-10-CM | POA: Diagnosis not present

## 2022-08-19 DIAGNOSIS — G4733 Obstructive sleep apnea (adult) (pediatric): Secondary | ICD-10-CM | POA: Diagnosis not present

## 2022-08-19 DIAGNOSIS — J449 Chronic obstructive pulmonary disease, unspecified: Secondary | ICD-10-CM | POA: Diagnosis not present

## 2022-08-19 DIAGNOSIS — M6281 Muscle weakness (generalized): Secondary | ICD-10-CM | POA: Diagnosis not present

## 2022-08-19 DIAGNOSIS — I10 Essential (primary) hypertension: Secondary | ICD-10-CM | POA: Diagnosis not present

## 2022-08-21 DIAGNOSIS — E039 Hypothyroidism, unspecified: Secondary | ICD-10-CM | POA: Diagnosis not present

## 2022-08-21 DIAGNOSIS — E669 Obesity, unspecified: Secondary | ICD-10-CM | POA: Diagnosis not present

## 2022-08-21 DIAGNOSIS — Z9981 Dependence on supplemental oxygen: Secondary | ICD-10-CM | POA: Diagnosis not present

## 2022-08-21 DIAGNOSIS — I110 Hypertensive heart disease with heart failure: Secondary | ICD-10-CM | POA: Diagnosis not present

## 2022-08-21 DIAGNOSIS — J449 Chronic obstructive pulmonary disease, unspecified: Secondary | ICD-10-CM | POA: Diagnosis not present

## 2022-08-21 DIAGNOSIS — I5032 Chronic diastolic (congestive) heart failure: Secondary | ICD-10-CM | POA: Diagnosis not present

## 2022-08-21 DIAGNOSIS — N179 Acute kidney failure, unspecified: Secondary | ICD-10-CM | POA: Diagnosis not present

## 2022-08-21 DIAGNOSIS — E11649 Type 2 diabetes mellitus with hypoglycemia without coma: Secondary | ICD-10-CM | POA: Diagnosis not present

## 2022-08-21 DIAGNOSIS — G4733 Obstructive sleep apnea (adult) (pediatric): Secondary | ICD-10-CM | POA: Diagnosis not present

## 2022-08-21 DIAGNOSIS — I2510 Atherosclerotic heart disease of native coronary artery without angina pectoris: Secondary | ICD-10-CM | POA: Diagnosis not present

## 2022-08-21 DIAGNOSIS — T8189XD Other complications of procedures, not elsewhere classified, subsequent encounter: Secondary | ICD-10-CM | POA: Diagnosis not present

## 2022-08-21 DIAGNOSIS — E1169 Type 2 diabetes mellitus with other specified complication: Secondary | ICD-10-CM | POA: Diagnosis not present

## 2022-08-21 DIAGNOSIS — M86171 Other acute osteomyelitis, right ankle and foot: Secondary | ICD-10-CM | POA: Diagnosis not present

## 2022-08-21 DIAGNOSIS — I480 Paroxysmal atrial fibrillation: Secondary | ICD-10-CM | POA: Diagnosis not present

## 2022-08-21 DIAGNOSIS — J9611 Chronic respiratory failure with hypoxia: Secondary | ICD-10-CM | POA: Diagnosis not present

## 2022-08-21 DIAGNOSIS — E1151 Type 2 diabetes mellitus with diabetic peripheral angiopathy without gangrene: Secondary | ICD-10-CM | POA: Diagnosis not present

## 2022-08-24 ENCOUNTER — Ambulatory Visit (INDEPENDENT_AMBULATORY_CARE_PROVIDER_SITE_OTHER): Payer: Medicare Other | Admitting: Nurse Practitioner

## 2022-08-24 ENCOUNTER — Encounter (INDEPENDENT_AMBULATORY_CARE_PROVIDER_SITE_OTHER): Payer: Self-pay | Admitting: Nurse Practitioner

## 2022-08-24 VITALS — BP 121/73 | HR 83 | Resp 18 | Ht 69.0 in | Wt 299.0 lb

## 2022-08-24 DIAGNOSIS — I5032 Chronic diastolic (congestive) heart failure: Secondary | ICD-10-CM | POA: Diagnosis not present

## 2022-08-24 DIAGNOSIS — T8189XD Other complications of procedures, not elsewhere classified, subsequent encounter: Secondary | ICD-10-CM | POA: Diagnosis not present

## 2022-08-24 DIAGNOSIS — I110 Hypertensive heart disease with heart failure: Secondary | ICD-10-CM | POA: Diagnosis not present

## 2022-08-24 DIAGNOSIS — E1169 Type 2 diabetes mellitus with other specified complication: Secondary | ICD-10-CM | POA: Diagnosis not present

## 2022-08-24 DIAGNOSIS — E11649 Type 2 diabetes mellitus with hypoglycemia without coma: Secondary | ICD-10-CM | POA: Diagnosis not present

## 2022-08-24 DIAGNOSIS — J9611 Chronic respiratory failure with hypoxia: Secondary | ICD-10-CM | POA: Diagnosis not present

## 2022-08-24 DIAGNOSIS — E669 Obesity, unspecified: Secondary | ICD-10-CM | POA: Diagnosis not present

## 2022-08-24 DIAGNOSIS — N179 Acute kidney failure, unspecified: Secondary | ICD-10-CM | POA: Diagnosis not present

## 2022-08-24 DIAGNOSIS — Z89511 Acquired absence of right leg below knee: Secondary | ICD-10-CM

## 2022-08-24 DIAGNOSIS — G4733 Obstructive sleep apnea (adult) (pediatric): Secondary | ICD-10-CM | POA: Diagnosis not present

## 2022-08-24 DIAGNOSIS — Z9981 Dependence on supplemental oxygen: Secondary | ICD-10-CM | POA: Diagnosis not present

## 2022-08-24 DIAGNOSIS — E039 Hypothyroidism, unspecified: Secondary | ICD-10-CM | POA: Diagnosis not present

## 2022-08-24 DIAGNOSIS — I2510 Atherosclerotic heart disease of native coronary artery without angina pectoris: Secondary | ICD-10-CM | POA: Diagnosis not present

## 2022-08-24 DIAGNOSIS — I10 Essential (primary) hypertension: Secondary | ICD-10-CM

## 2022-08-24 DIAGNOSIS — M86171 Other acute osteomyelitis, right ankle and foot: Secondary | ICD-10-CM | POA: Diagnosis not present

## 2022-08-24 DIAGNOSIS — I480 Paroxysmal atrial fibrillation: Secondary | ICD-10-CM | POA: Diagnosis not present

## 2022-08-24 DIAGNOSIS — E1151 Type 2 diabetes mellitus with diabetic peripheral angiopathy without gangrene: Secondary | ICD-10-CM | POA: Diagnosis not present

## 2022-08-24 DIAGNOSIS — J449 Chronic obstructive pulmonary disease, unspecified: Secondary | ICD-10-CM | POA: Diagnosis not present

## 2022-08-25 DIAGNOSIS — I10 Essential (primary) hypertension: Secondary | ICD-10-CM | POA: Diagnosis not present

## 2022-08-25 DIAGNOSIS — G4733 Obstructive sleep apnea (adult) (pediatric): Secondary | ICD-10-CM | POA: Diagnosis not present

## 2022-08-27 DIAGNOSIS — J9611 Chronic respiratory failure with hypoxia: Secondary | ICD-10-CM | POA: Diagnosis not present

## 2022-08-27 DIAGNOSIS — G4733 Obstructive sleep apnea (adult) (pediatric): Secondary | ICD-10-CM | POA: Diagnosis not present

## 2022-08-27 DIAGNOSIS — T8189XD Other complications of procedures, not elsewhere classified, subsequent encounter: Secondary | ICD-10-CM | POA: Diagnosis not present

## 2022-08-27 DIAGNOSIS — I5032 Chronic diastolic (congestive) heart failure: Secondary | ICD-10-CM | POA: Diagnosis not present

## 2022-08-27 DIAGNOSIS — E039 Hypothyroidism, unspecified: Secondary | ICD-10-CM | POA: Diagnosis not present

## 2022-08-27 DIAGNOSIS — E1151 Type 2 diabetes mellitus with diabetic peripheral angiopathy without gangrene: Secondary | ICD-10-CM | POA: Diagnosis not present

## 2022-08-27 DIAGNOSIS — I2510 Atherosclerotic heart disease of native coronary artery without angina pectoris: Secondary | ICD-10-CM | POA: Diagnosis not present

## 2022-08-27 DIAGNOSIS — Z9981 Dependence on supplemental oxygen: Secondary | ICD-10-CM | POA: Diagnosis not present

## 2022-08-27 DIAGNOSIS — M86171 Other acute osteomyelitis, right ankle and foot: Secondary | ICD-10-CM | POA: Diagnosis not present

## 2022-08-27 DIAGNOSIS — J449 Chronic obstructive pulmonary disease, unspecified: Secondary | ICD-10-CM | POA: Diagnosis not present

## 2022-08-27 DIAGNOSIS — I480 Paroxysmal atrial fibrillation: Secondary | ICD-10-CM | POA: Diagnosis not present

## 2022-08-27 DIAGNOSIS — N179 Acute kidney failure, unspecified: Secondary | ICD-10-CM | POA: Diagnosis not present

## 2022-08-27 DIAGNOSIS — E669 Obesity, unspecified: Secondary | ICD-10-CM | POA: Diagnosis not present

## 2022-08-27 DIAGNOSIS — E11649 Type 2 diabetes mellitus with hypoglycemia without coma: Secondary | ICD-10-CM | POA: Diagnosis not present

## 2022-08-27 DIAGNOSIS — I110 Hypertensive heart disease with heart failure: Secondary | ICD-10-CM | POA: Diagnosis not present

## 2022-08-27 DIAGNOSIS — E1169 Type 2 diabetes mellitus with other specified complication: Secondary | ICD-10-CM | POA: Diagnosis not present

## 2022-08-28 DIAGNOSIS — I2510 Atherosclerotic heart disease of native coronary artery without angina pectoris: Secondary | ICD-10-CM | POA: Diagnosis not present

## 2022-08-28 DIAGNOSIS — J449 Chronic obstructive pulmonary disease, unspecified: Secondary | ICD-10-CM | POA: Diagnosis not present

## 2022-08-28 DIAGNOSIS — E1151 Type 2 diabetes mellitus with diabetic peripheral angiopathy without gangrene: Secondary | ICD-10-CM | POA: Diagnosis not present

## 2022-08-28 DIAGNOSIS — I5032 Chronic diastolic (congestive) heart failure: Secondary | ICD-10-CM | POA: Diagnosis not present

## 2022-08-28 DIAGNOSIS — N179 Acute kidney failure, unspecified: Secondary | ICD-10-CM | POA: Diagnosis not present

## 2022-08-28 DIAGNOSIS — E039 Hypothyroidism, unspecified: Secondary | ICD-10-CM | POA: Diagnosis not present

## 2022-08-28 DIAGNOSIS — I110 Hypertensive heart disease with heart failure: Secondary | ICD-10-CM | POA: Diagnosis not present

## 2022-08-28 DIAGNOSIS — M86171 Other acute osteomyelitis, right ankle and foot: Secondary | ICD-10-CM | POA: Diagnosis not present

## 2022-08-28 DIAGNOSIS — J9611 Chronic respiratory failure with hypoxia: Secondary | ICD-10-CM | POA: Diagnosis not present

## 2022-08-28 DIAGNOSIS — E11649 Type 2 diabetes mellitus with hypoglycemia without coma: Secondary | ICD-10-CM | POA: Diagnosis not present

## 2022-08-28 DIAGNOSIS — E1169 Type 2 diabetes mellitus with other specified complication: Secondary | ICD-10-CM | POA: Diagnosis not present

## 2022-08-28 DIAGNOSIS — E669 Obesity, unspecified: Secondary | ICD-10-CM | POA: Diagnosis not present

## 2022-08-28 DIAGNOSIS — I480 Paroxysmal atrial fibrillation: Secondary | ICD-10-CM | POA: Diagnosis not present

## 2022-08-28 DIAGNOSIS — Z9981 Dependence on supplemental oxygen: Secondary | ICD-10-CM | POA: Diagnosis not present

## 2022-08-28 DIAGNOSIS — G4733 Obstructive sleep apnea (adult) (pediatric): Secondary | ICD-10-CM | POA: Diagnosis not present

## 2022-08-28 DIAGNOSIS — T8189XD Other complications of procedures, not elsewhere classified, subsequent encounter: Secondary | ICD-10-CM | POA: Diagnosis not present

## 2022-08-29 DIAGNOSIS — E669 Obesity, unspecified: Secondary | ICD-10-CM | POA: Diagnosis not present

## 2022-08-29 DIAGNOSIS — I480 Paroxysmal atrial fibrillation: Secondary | ICD-10-CM | POA: Diagnosis not present

## 2022-08-29 DIAGNOSIS — E1169 Type 2 diabetes mellitus with other specified complication: Secondary | ICD-10-CM | POA: Diagnosis not present

## 2022-08-29 DIAGNOSIS — T8189XD Other complications of procedures, not elsewhere classified, subsequent encounter: Secondary | ICD-10-CM | POA: Diagnosis not present

## 2022-08-29 DIAGNOSIS — J9611 Chronic respiratory failure with hypoxia: Secondary | ICD-10-CM | POA: Diagnosis not present

## 2022-08-29 DIAGNOSIS — E1151 Type 2 diabetes mellitus with diabetic peripheral angiopathy without gangrene: Secondary | ICD-10-CM | POA: Diagnosis not present

## 2022-08-29 DIAGNOSIS — G4733 Obstructive sleep apnea (adult) (pediatric): Secondary | ICD-10-CM | POA: Diagnosis not present

## 2022-08-29 DIAGNOSIS — I110 Hypertensive heart disease with heart failure: Secondary | ICD-10-CM | POA: Diagnosis not present

## 2022-08-29 DIAGNOSIS — I5032 Chronic diastolic (congestive) heart failure: Secondary | ICD-10-CM | POA: Diagnosis not present

## 2022-08-29 DIAGNOSIS — Z9981 Dependence on supplemental oxygen: Secondary | ICD-10-CM | POA: Diagnosis not present

## 2022-08-29 DIAGNOSIS — E11649 Type 2 diabetes mellitus with hypoglycemia without coma: Secondary | ICD-10-CM | POA: Diagnosis not present

## 2022-08-29 DIAGNOSIS — J449 Chronic obstructive pulmonary disease, unspecified: Secondary | ICD-10-CM | POA: Diagnosis not present

## 2022-08-29 DIAGNOSIS — N179 Acute kidney failure, unspecified: Secondary | ICD-10-CM | POA: Diagnosis not present

## 2022-08-29 DIAGNOSIS — I2510 Atherosclerotic heart disease of native coronary artery without angina pectoris: Secondary | ICD-10-CM | POA: Diagnosis not present

## 2022-08-29 DIAGNOSIS — E039 Hypothyroidism, unspecified: Secondary | ICD-10-CM | POA: Diagnosis not present

## 2022-08-29 DIAGNOSIS — M86171 Other acute osteomyelitis, right ankle and foot: Secondary | ICD-10-CM | POA: Diagnosis not present

## 2022-08-30 DIAGNOSIS — E039 Hypothyroidism, unspecified: Secondary | ICD-10-CM | POA: Diagnosis not present

## 2022-08-30 DIAGNOSIS — Z9981 Dependence on supplemental oxygen: Secondary | ICD-10-CM | POA: Diagnosis not present

## 2022-08-30 DIAGNOSIS — N179 Acute kidney failure, unspecified: Secondary | ICD-10-CM | POA: Diagnosis not present

## 2022-08-30 DIAGNOSIS — I2510 Atherosclerotic heart disease of native coronary artery without angina pectoris: Secondary | ICD-10-CM | POA: Diagnosis not present

## 2022-08-30 DIAGNOSIS — E1151 Type 2 diabetes mellitus with diabetic peripheral angiopathy without gangrene: Secondary | ICD-10-CM | POA: Diagnosis not present

## 2022-08-30 DIAGNOSIS — E11649 Type 2 diabetes mellitus with hypoglycemia without coma: Secondary | ICD-10-CM | POA: Diagnosis not present

## 2022-08-30 DIAGNOSIS — J449 Chronic obstructive pulmonary disease, unspecified: Secondary | ICD-10-CM | POA: Diagnosis not present

## 2022-08-30 DIAGNOSIS — E1169 Type 2 diabetes mellitus with other specified complication: Secondary | ICD-10-CM | POA: Diagnosis not present

## 2022-08-30 DIAGNOSIS — E669 Obesity, unspecified: Secondary | ICD-10-CM | POA: Diagnosis not present

## 2022-08-30 DIAGNOSIS — G4733 Obstructive sleep apnea (adult) (pediatric): Secondary | ICD-10-CM | POA: Diagnosis not present

## 2022-08-30 DIAGNOSIS — I110 Hypertensive heart disease with heart failure: Secondary | ICD-10-CM | POA: Diagnosis not present

## 2022-08-30 DIAGNOSIS — I5032 Chronic diastolic (congestive) heart failure: Secondary | ICD-10-CM | POA: Diagnosis not present

## 2022-08-30 DIAGNOSIS — I480 Paroxysmal atrial fibrillation: Secondary | ICD-10-CM | POA: Diagnosis not present

## 2022-08-30 DIAGNOSIS — M86171 Other acute osteomyelitis, right ankle and foot: Secondary | ICD-10-CM | POA: Diagnosis not present

## 2022-08-30 DIAGNOSIS — J9611 Chronic respiratory failure with hypoxia: Secondary | ICD-10-CM | POA: Diagnosis not present

## 2022-08-30 DIAGNOSIS — T8189XD Other complications of procedures, not elsewhere classified, subsequent encounter: Secondary | ICD-10-CM | POA: Diagnosis not present

## 2022-09-03 DIAGNOSIS — J449 Chronic obstructive pulmonary disease, unspecified: Secondary | ICD-10-CM | POA: Diagnosis not present

## 2022-09-03 DIAGNOSIS — E1151 Type 2 diabetes mellitus with diabetic peripheral angiopathy without gangrene: Secondary | ICD-10-CM | POA: Diagnosis not present

## 2022-09-03 DIAGNOSIS — Z794 Long term (current) use of insulin: Secondary | ICD-10-CM | POA: Diagnosis not present

## 2022-09-03 DIAGNOSIS — J9611 Chronic respiratory failure with hypoxia: Secondary | ICD-10-CM | POA: Diagnosis not present

## 2022-09-03 DIAGNOSIS — I480 Paroxysmal atrial fibrillation: Secondary | ICD-10-CM | POA: Diagnosis not present

## 2022-09-03 DIAGNOSIS — N179 Acute kidney failure, unspecified: Secondary | ICD-10-CM | POA: Diagnosis not present

## 2022-09-03 DIAGNOSIS — N400 Enlarged prostate without lower urinary tract symptoms: Secondary | ICD-10-CM | POA: Diagnosis not present

## 2022-09-03 DIAGNOSIS — E039 Hypothyroidism, unspecified: Secondary | ICD-10-CM | POA: Diagnosis not present

## 2022-09-03 DIAGNOSIS — Z7982 Long term (current) use of aspirin: Secondary | ICD-10-CM | POA: Diagnosis not present

## 2022-09-03 DIAGNOSIS — E669 Obesity, unspecified: Secondary | ICD-10-CM | POA: Diagnosis not present

## 2022-09-03 DIAGNOSIS — I5032 Chronic diastolic (congestive) heart failure: Secondary | ICD-10-CM | POA: Diagnosis not present

## 2022-09-03 DIAGNOSIS — I110 Hypertensive heart disease with heart failure: Secondary | ICD-10-CM | POA: Diagnosis not present

## 2022-09-03 DIAGNOSIS — G4733 Obstructive sleep apnea (adult) (pediatric): Secondary | ICD-10-CM | POA: Diagnosis not present

## 2022-09-03 DIAGNOSIS — Z7901 Long term (current) use of anticoagulants: Secondary | ICD-10-CM | POA: Diagnosis not present

## 2022-09-03 DIAGNOSIS — I2510 Atherosclerotic heart disease of native coronary artery without angina pectoris: Secondary | ICD-10-CM | POA: Diagnosis not present

## 2022-09-03 DIAGNOSIS — Z4781 Encounter for orthopedic aftercare following surgical amputation: Secondary | ICD-10-CM | POA: Diagnosis not present

## 2022-09-05 DIAGNOSIS — Z7901 Long term (current) use of anticoagulants: Secondary | ICD-10-CM | POA: Diagnosis not present

## 2022-09-05 DIAGNOSIS — I480 Paroxysmal atrial fibrillation: Secondary | ICD-10-CM | POA: Diagnosis not present

## 2022-09-05 DIAGNOSIS — N179 Acute kidney failure, unspecified: Secondary | ICD-10-CM | POA: Diagnosis not present

## 2022-09-05 DIAGNOSIS — J9611 Chronic respiratory failure with hypoxia: Secondary | ICD-10-CM | POA: Diagnosis not present

## 2022-09-05 DIAGNOSIS — I2510 Atherosclerotic heart disease of native coronary artery without angina pectoris: Secondary | ICD-10-CM | POA: Diagnosis not present

## 2022-09-05 DIAGNOSIS — J449 Chronic obstructive pulmonary disease, unspecified: Secondary | ICD-10-CM | POA: Diagnosis not present

## 2022-09-05 DIAGNOSIS — I5032 Chronic diastolic (congestive) heart failure: Secondary | ICD-10-CM | POA: Diagnosis not present

## 2022-09-05 DIAGNOSIS — Z794 Long term (current) use of insulin: Secondary | ICD-10-CM | POA: Diagnosis not present

## 2022-09-05 DIAGNOSIS — I110 Hypertensive heart disease with heart failure: Secondary | ICD-10-CM | POA: Diagnosis not present

## 2022-09-05 DIAGNOSIS — E1151 Type 2 diabetes mellitus with diabetic peripheral angiopathy without gangrene: Secondary | ICD-10-CM | POA: Diagnosis not present

## 2022-09-05 DIAGNOSIS — E039 Hypothyroidism, unspecified: Secondary | ICD-10-CM | POA: Diagnosis not present

## 2022-09-05 DIAGNOSIS — E669 Obesity, unspecified: Secondary | ICD-10-CM | POA: Diagnosis not present

## 2022-09-05 DIAGNOSIS — Z4781 Encounter for orthopedic aftercare following surgical amputation: Secondary | ICD-10-CM | POA: Diagnosis not present

## 2022-09-05 DIAGNOSIS — G4733 Obstructive sleep apnea (adult) (pediatric): Secondary | ICD-10-CM | POA: Diagnosis not present

## 2022-09-05 DIAGNOSIS — N400 Enlarged prostate without lower urinary tract symptoms: Secondary | ICD-10-CM | POA: Diagnosis not present

## 2022-09-05 DIAGNOSIS — Z7982 Long term (current) use of aspirin: Secondary | ICD-10-CM | POA: Diagnosis not present

## 2022-09-06 DIAGNOSIS — M6281 Muscle weakness (generalized): Secondary | ICD-10-CM | POA: Diagnosis not present

## 2022-09-06 DIAGNOSIS — J9601 Acute respiratory failure with hypoxia: Secondary | ICD-10-CM | POA: Diagnosis not present

## 2022-09-06 DIAGNOSIS — Z4781 Encounter for orthopedic aftercare following surgical amputation: Secondary | ICD-10-CM | POA: Diagnosis not present

## 2022-09-14 DIAGNOSIS — E669 Obesity, unspecified: Secondary | ICD-10-CM | POA: Diagnosis not present

## 2022-09-14 DIAGNOSIS — I5032 Chronic diastolic (congestive) heart failure: Secondary | ICD-10-CM | POA: Diagnosis not present

## 2022-09-14 DIAGNOSIS — I2510 Atherosclerotic heart disease of native coronary artery without angina pectoris: Secondary | ICD-10-CM | POA: Diagnosis not present

## 2022-09-14 DIAGNOSIS — J449 Chronic obstructive pulmonary disease, unspecified: Secondary | ICD-10-CM | POA: Diagnosis not present

## 2022-09-14 DIAGNOSIS — E039 Hypothyroidism, unspecified: Secondary | ICD-10-CM | POA: Diagnosis not present

## 2022-09-14 DIAGNOSIS — Z794 Long term (current) use of insulin: Secondary | ICD-10-CM | POA: Diagnosis not present

## 2022-09-14 DIAGNOSIS — I480 Paroxysmal atrial fibrillation: Secondary | ICD-10-CM | POA: Diagnosis not present

## 2022-09-14 DIAGNOSIS — N179 Acute kidney failure, unspecified: Secondary | ICD-10-CM | POA: Diagnosis not present

## 2022-09-14 DIAGNOSIS — Z4781 Encounter for orthopedic aftercare following surgical amputation: Secondary | ICD-10-CM | POA: Diagnosis not present

## 2022-09-14 DIAGNOSIS — G4733 Obstructive sleep apnea (adult) (pediatric): Secondary | ICD-10-CM | POA: Diagnosis not present

## 2022-09-14 DIAGNOSIS — Z7982 Long term (current) use of aspirin: Secondary | ICD-10-CM | POA: Diagnosis not present

## 2022-09-14 DIAGNOSIS — Z7901 Long term (current) use of anticoagulants: Secondary | ICD-10-CM | POA: Diagnosis not present

## 2022-09-14 DIAGNOSIS — N400 Enlarged prostate without lower urinary tract symptoms: Secondary | ICD-10-CM | POA: Diagnosis not present

## 2022-09-14 DIAGNOSIS — J9611 Chronic respiratory failure with hypoxia: Secondary | ICD-10-CM | POA: Diagnosis not present

## 2022-09-14 DIAGNOSIS — I110 Hypertensive heart disease with heart failure: Secondary | ICD-10-CM | POA: Diagnosis not present

## 2022-09-14 DIAGNOSIS — E1151 Type 2 diabetes mellitus with diabetic peripheral angiopathy without gangrene: Secondary | ICD-10-CM | POA: Diagnosis not present

## 2022-09-17 ENCOUNTER — Encounter (INDEPENDENT_AMBULATORY_CARE_PROVIDER_SITE_OTHER): Payer: Self-pay | Admitting: Nurse Practitioner

## 2022-09-17 NOTE — Progress Notes (Signed)
Subjective:    Patient ID: Alec Bender., male    DOB: 03-19-1956, 67 y.o.   MRN: [id number] Chief Complaint  Patient presents with   Follow-up    f/u in 6 weeks with no studies    [name]. is a 67 y.o. male.  Patient returns in follow-up after his right amputation.  He is about 2 months status post right below-knee amputation.  His wound is almost completely healed except for a very small opening on the medial portion with minimal drainage.  No fevers or chills.  No erythema or signs of infection.    Review of Systems  Skin:  Positive for wound.  All other systems reviewed and are negative.      Objective:   Physical Exam Vitals reviewed.  HENT:     Head: Normocephalic.  Cardiovascular:     Rate and Rhythm: Normal rate.  Pulmonary:     Effort: Pulmonary effort is normal.  Musculoskeletal:     Right Lower Extremity: Right leg is amputated below knee.     Left Lower Extremity: Left leg is amputated below knee.  Skin:    General: Skin is warm and dry.  Neurological:     Mental Status: He is alert and oriented to person, place, and time.  Psychiatric:        Mood and Affect: Mood normal.        Behavior: Behavior normal.        Thought Content: Thought content normal.        Judgment: Judgment normal.     BP 121/73 (BP Location: Left Arm)   Pulse 83   Resp 18   Ht 5\' 9"  (1.753 m)   Wt 299 lb (135.6 kg)   BMI 44.15 kg/m   Past Medical History:  Diagnosis Date   Arrhythmia    atrial fibrillation   Asthma    CHF (congestive heart failure) (HCC)    COPD (chronic obstructive pulmonary disease) (HCC)    Coronary artery disease    Depression    Diabetes mellitus without complication (HCC)    Gout    History anabolic steroid use    Hyperlipidemia    Hypertension    Hypogonadism in male    MI (myocardial infarction) (HCC)    Morbid obesity (HCC)    Myocardial infarction (HCC)    Peripheral vascular disease (HCC)    Perirectal abscess     Pleurisy    Sleep apnea    CPAP at night, no oxygen   Varicella     Social History   Socioeconomic History   Marital status: Married    Spouse name: Not on file   Number of children: Not on file   Years of education: Not on file   Highest education level: Not on file  Occupational History   Not on file  Tobacco Use   Smoking status: Former    Packs/day: 0.50    Years: 45.00    Additional pack years: 0.00    Total pack years: 22.50    Types: Cigarettes    Quit date: 04/05/2015    Years since quitting: 7.4   Smokeless tobacco: Never  Vaping Use   Vaping Use: Every day  Substance and Sexual Activity   Alcohol use: Not Currently    Alcohol/week: 3.0 standard drinks of alcohol    Types: 3 Glasses of wine per week   Drug use: No   Sexual activity: Not on file  Other Topics Concern   Not on file  Social History Narrative   Not on file   Social Determinants of Health   Financial Resource Strain: Not on file  Food Insecurity: No Food Insecurity (05/19/2022)   Hunger Vital Sign    Worried About Running Out of Food in the Last Year: Never true    Ran Out of Food in the Last Year: Never true  Transportation Needs: No Transportation Needs (05/19/2022)   PRAPARE - Administrator, Civil Service (Medical): No    Lack of Transportation (Non-Medical): No  Recent Concern: Transportation Needs - Unmet Transportation Needs (03/21/2022)   PRAPARE - Transportation    Lack of Transportation (Medical): Yes    Lack of Transportation (Non-Medical): Yes  Physical Activity: Not on file  Stress: Not on file  Social Connections: Not on file  Intimate Partner Violence: Not At Risk (05/19/2022)   Humiliation, Afraid, Rape, and Kick questionnaire    Fear of Current or Ex-Partner: No    Emotionally Abused: No    Physically Abused: No    Sexually Abused: No    Past Surgical History:  Procedure Laterality Date   ABDOMINAL AORTIC ANEURYSM REPAIR     ACHILLES TENDON SURGERY  Left 01/10/2021   Procedure: ACHILLES LENGTHENING/KIDNER;  Surgeon: Rosetta Posner, DPM;  Location: [facility];  Service: Podiatry;  Laterality: Left;   AMPUTATION Left 10/03/2021   Procedure: AMPUTATION BELOW KNEE;  Surgeon: Renford Dills, MD;  Location: [facility];  Service: Vascular;  Laterality: Left;   AMPUTATION Right 05/24/2022   Procedure: AMPUTATION BELOW KNEE;  Surgeon: Annice Needy, MD;  Location: [facility];  Service: General;  Laterality: Right;   AMPUTATION TOE Right 02/10/2016   Procedure: AMPUTATION TOE 3RD TOE;  Surgeon: Gwyneth Revels, DPM;  Location: [facility];  Service: Podiatry;  Laterality: Right;   AMPUTATION TOE Left 02/24/2020   Procedure: AMPUTATION TOE;  Surgeon: Rosetta Posner, DPM;  Location: [facility];  Service: Podiatry;  Laterality: Left;   APPLICATION OF WOUND VAC Left 02/29/2020   Procedure: APPLICATION OF WOUND VAC;  Surgeon: Rosetta Posner, DPM;  Location: [facility];  Service: Podiatry;  Laterality: Left;   CARDIAC CATHETERIZATION     CARDIOVERSION [name] 02/13/2022   Procedure: CARDIOVERSION;  Surgeon: Lamar Blinks, MD;  Location: [facility];  Service: Cardiovascular;  Laterality: [name];   COLONOSCOPY WITH PROPOFOL [name] 11/18/2015   Procedure: COLONOSCOPY WITH PROPOFOL;  Surgeon: Scot Jun, MD;  Location: [facility];  Service: Endoscopy;  Laterality: [name];   CORONARY ARTERY BYPASS GRAFT     CORONARY STENT INTERVENTION [name] 02/02/2020   Procedure: CORONARY STENT INTERVENTION;  Surgeon: Marcina Millard, MD;  Location: [facility];  Service: Cardiovascular;  Laterality: [name];   INCISION AND DRAINAGE Left 08/07/2021   Procedure: INCISION AND DRAINAGE-Partial Calcanectomy;  Surgeon: Rosetta Posner, DPM;  Location: [facility];  Service: Podiatry;  Laterality: Left;   IRRIGATION AND DEBRIDEMENT FOOT Left 02/29/2020   Procedure: IRRIGATION AND DEBRIDEMENT FOOT;  Surgeon: Rosetta Posner, DPM;  Location: [facility];  Service: Podiatry;  Laterality: Left;    IRRIGATION AND DEBRIDEMENT FOOT Left 02/24/2020   Procedure: IRRIGATION AND DEBRIDEMENT FOOT;  Surgeon: Rosetta Posner, DPM;  Location: [facility];  Service: Podiatry;  Laterality: Left;   KNEE ARTHROSCOPY     LEFT HEART CATH AND CORS/GRAFTS ANGIOGRAPHY [name] 02/02/2020   Procedure: LEFT HEART CATH AND CORS/GRAFTS ANGIOGRAPHY;  Surgeon: Dalia Heading, MD;  Location: [facility];  Service: Cardiovascular;  Laterality: [name];   LOWER EXTREMITY ANGIOGRAPHY Left 02/25/2020   Procedure: Lower Extremity Angiography;  Surgeon: Annice Needy, MD;  Location: [facility];  Service: Cardiovascular;  Laterality: Left;   LOWER EXTREMITY ANGIOGRAPHY Left 01/04/2021   Procedure: LOWER EXTREMITY ANGIOGRAPHY;  Surgeon: Annice Needy, MD;  Location: [facility];  Service: Cardiovascular;  Laterality: Left;   LOWER EXTREMITY ANGIOGRAPHY Right 05/21/2022   Procedure: Lower Extremity Angiography;  Surgeon: Annice Needy, MD;  Location: [facility];  Service: Cardiovascular;  Laterality: Right;   METATARSAL HEAD EXCISION Left 01/10/2021   Procedure: METATARSAL HEAD EXCISION - LEFT 5th;  Surgeon: Rosetta Posner, DPM;  Location: [facility];  Service: Podiatry;  Laterality: Left;   PERIPHERAL VASCULAR CATHETERIZATION Right 01/24/2016   Procedure: Lower Extremity Angiography;  Surgeon: Renford Dills, MD;  Location: [facility];  Service: Cardiovascular;  Laterality: Right;   PERIPHERAL VASCULAR CATHETERIZATION Right 01/25/2016   Procedure: Lower Extremity Angiography;  Surgeon: Renford Dills, MD;  Location: [facility];  Service: Cardiovascular;  Laterality: Right;   TOE AMPUTATION     TONSILLECTOMY      Family History  Problem Relation Age of Onset   Hypertension Father    Coronary artery disease Father    Alcohol abuse Father    Heart failure Brother     Allergies  Allergen Reactions   Penicillins Other (See Comments)    Happened at 67 years old and pt. stated  he passed out  He has tolerated amoxicillin/clavulanate and ampicillin/sulbactam   Statins     Other reaction(s): Muscle Pain Causes legs to ache per pt   Metformin And Related Diarrhea       Latest Ref Rng & Units 06/06/2022    2:59 AM 06/05/2022    3:21 AM 06/04/2022    3:43 AM  CBC  WBC 4.0 - 10.5 K/uL 9.8  10.9  11.0   Hemoglobin 13.0 - 17.0 g/dL 8.5  8.4  8.5   Hematocrit 39.0 - 52.0 % 27.6  27.4  27.6   Platelets 150 - 400 K/uL 310  349  357       CMP     Component Value Date/Time   NA 135 06/06/2022 0259   NA 141 07/12/2011 0919   K 4.6 06/06/2022 0259   K 4.4 07/12/2011 0919   CL 103 06/06/2022 0259   CL 102 07/12/2011 0919   CO2 26 06/06/2022 0259   CO2 29 07/12/2011 0919   GLUCOSE 161 (H) 06/06/2022 0259   GLUCOSE 76 07/12/2011 0919   BUN 49 (H) 06/06/2022 0259   BUN 16 05/05/2013 1022   CREATININE 1.90 (H) 08/13/2022 1412   CREATININE 0.71 05/05/2013 1022   CALCIUM 8.8 (L) 06/06/2022 0259   CALCIUM 8.8 07/12/2011 0919   PROT 7.4 05/18/2022 1527   ALBUMIN 2.6 (L) 05/18/2022 1527   AST 18 05/18/2022 1527   ALT 25 05/18/2022 1527   ALKPHOS 170 (H) 05/18/2022 1527   BILITOT 0.3 05/18/2022 1527   GFRNONAA >60 06/06/2022 0259   GFRNONAA >60 05/05/2013 1022   GFRAA >60 01/06/2020 1853   GFRAA >60 05/05/2013 1022     No results found.     Assessment & Plan:   1. Hx of BKA, right (HCC) The patient has his new stump shrinker and it is working well however he still has just a very small open area that continues to have some slight wounds.  He has been putting Neosporin over the area.  It was also noted that he had 1 retained staple which was removed.  Will have him return in 8 weeks to ensure full wound healing.  2. Essential hypertension Continue antihypertensive medications as already ordered, these medications have been reviewed and there are no changes at this time.   Current Outpatient Medications on File Prior to Visit  Medication Sig Dispense Refill    acetaminophen (TYLENOL) 325 MG tablet Take 2 tablets (650 mg total) by mouth every 6 (six) hours as needed for mild pain (or Fever >/= 101).     amiodarone (PACERONE) 200 MG tablet Take 1 tablet (200 mg total) by mouth daily. 90 tablet 3   apixaban (ELIQUIS) 5 MG TABS tablet Take 5 mg by mouth 2 (two) times daily.     aspirin EC 81 MG tablet Take 81 mg by mouth daily. Swallow whole.     budesonide (PULMICORT) 0.5 MG/2ML nebulizer solution Take 2 mLs (0.5 mg total) by nebulization 2 (two) times daily. 120 mL 0   Cholecalciferol 25 MCG (1000 UT) tablet Take 1,000 Units by mouth daily.     Continuous Blood Gluc Sensor (FREESTYLE LIBRE 3 SENSOR) MISC      Cyanocobalamin (VITAMIN B-12) 5000 MCG TBDP Take 10,000 mcg by mouth daily.     diltiazem (CARDIZEM CD) 300 MG 24 hr capsule Take 1 capsule (300 mg total) by mouth daily. 90 capsule 1   ezetimibe (ZETIA) 10 MG tablet Take 10 mg by mouth at bedtime.     ferrous sulfate 325 (65 FE) MG tablet Take 325 mg by mouth daily with breakfast.     finasteride (PROSCAR) 5 MG tablet Take 1 tablet (5 mg total) by mouth daily. 30 tablet 0   furosemide (LASIX) 20 MG tablet Take 1 tablet (20 mg total) by mouth ONCE OR TWICE daily (total daily dose maximum 40 mg) as needed for increased swelling, shortness of breath, weight gain 5+ lbs over 1-2 days. Seek medical care if these symptoms are not improving with increased dose, or if you become dizzy or have low blood pressure. 30 tablet 0   gabapentin (NEURONTIN) 300 MG capsule Take 300 mg by mouth at bedtime.     HUMALOG 100 UNIT/ML injection Inject 15 Units into the skin 3 (three) times daily with meals. >200     Infant Care Products (DERMACLOUD) OINT Apply 1 Application topically daily as needed.     insulin glargine (LANTUS) 100 UNIT/ML injection Inject 15 units into the skin daily in AM and inject 25 units into the skin daily in PM 10 mL 11   isosorbide mononitrate (IMDUR) 60 MG 24 hr tablet Take 1 tablet (60 mg  total) by mouth in the morning and at bedtime. 180 tablet 3   levothyroxine (SYNTHROID) 25 MCG tablet Take 1 tablet (25 mcg total) by mouth daily at 6 (six) AM. 30 tablet 0   losartan (COZAAR) 25 MG tablet Take 1 tablet (25 mg total) by mouth daily. 30 tablet 0   Multiple Vitamins-Minerals (MULTIVITAMIN WITH MINERALS) tablet Take 1 tablet by mouth daily.     nitrofurantoin, macrocrystal-monohydrate, (MACROBID) 100 MG capsule Take 100 mg by mouth daily.     nitroGLYCERIN (NITROSTAT) 0.4 MG SL tablet Place 1 tablet (0.4 mg total) under the tongue every 5 (five) minutes as needed for chest pain. 25 tablet 3   nutrition supplement, JUVEN, (JUVEN) PACK Take 1 packet by mouth 2 (two) times daily between  meals. 60 packet 0   nystatin (MYCOSTATIN/NYSTOP) powder Apply 1 Application topically 2 (two) times daily.     ondansetron (ZOFRAN) 4 MG tablet Take 1 tablet (4 mg total) by mouth every 6 (six) hours as needed for nausea. 20 tablet 0   pramipexole (MIRAPEX) 1 MG tablet Take 2 mg by mouth at bedtime.     primidone (MYSOLINE) 250 MG tablet Take 250 mg by mouth 2 (two) times daily.     risperiDONE (RISPERDAL) 0.5 MG tablet Take 0.5 tablets (0.25 mg total) by mouth 2 (two) times daily. 60 tablet 0   tamsulosin (FLOMAX) 0.4 MG CAPS capsule Take 0.4 mg by mouth daily.     vitamin C (ASCORBIC ACID) 500 MG tablet Take 500 mg by mouth daily.     zinc gluconate 50 MG tablet Take 50 mg by mouth daily.     zinc sulfate 220 (50 Zn) MG capsule Take 1 capsule (220 mg total) by mouth daily.     No current facility-administered medications on file prior to visit.    There are no Patient Instructions on file for this visit. No follow-ups on file.   Georgiana Spinner, NP

## 2022-09-18 DIAGNOSIS — J9611 Chronic respiratory failure with hypoxia: Secondary | ICD-10-CM | POA: Diagnosis not present

## 2022-09-18 DIAGNOSIS — N400 Enlarged prostate without lower urinary tract symptoms: Secondary | ICD-10-CM | POA: Diagnosis not present

## 2022-09-18 DIAGNOSIS — Z4781 Encounter for orthopedic aftercare following surgical amputation: Secondary | ICD-10-CM | POA: Diagnosis not present

## 2022-09-18 DIAGNOSIS — G4733 Obstructive sleep apnea (adult) (pediatric): Secondary | ICD-10-CM | POA: Diagnosis not present

## 2022-09-18 DIAGNOSIS — Z7901 Long term (current) use of anticoagulants: Secondary | ICD-10-CM | POA: Diagnosis not present

## 2022-09-18 DIAGNOSIS — E039 Hypothyroidism, unspecified: Secondary | ICD-10-CM | POA: Diagnosis not present

## 2022-09-18 DIAGNOSIS — I2510 Atherosclerotic heart disease of native coronary artery without angina pectoris: Secondary | ICD-10-CM | POA: Diagnosis not present

## 2022-09-18 DIAGNOSIS — Z7982 Long term (current) use of aspirin: Secondary | ICD-10-CM | POA: Diagnosis not present

## 2022-09-18 DIAGNOSIS — I110 Hypertensive heart disease with heart failure: Secondary | ICD-10-CM | POA: Diagnosis not present

## 2022-09-18 DIAGNOSIS — N179 Acute kidney failure, unspecified: Secondary | ICD-10-CM | POA: Diagnosis not present

## 2022-09-18 DIAGNOSIS — J449 Chronic obstructive pulmonary disease, unspecified: Secondary | ICD-10-CM | POA: Diagnosis not present

## 2022-09-18 DIAGNOSIS — E1151 Type 2 diabetes mellitus with diabetic peripheral angiopathy without gangrene: Secondary | ICD-10-CM | POA: Diagnosis not present

## 2022-09-18 DIAGNOSIS — Z794 Long term (current) use of insulin: Secondary | ICD-10-CM | POA: Diagnosis not present

## 2022-09-18 DIAGNOSIS — I480 Paroxysmal atrial fibrillation: Secondary | ICD-10-CM | POA: Diagnosis not present

## 2022-09-18 DIAGNOSIS — E669 Obesity, unspecified: Secondary | ICD-10-CM | POA: Diagnosis not present

## 2022-09-18 DIAGNOSIS — I5032 Chronic diastolic (congestive) heart failure: Secondary | ICD-10-CM | POA: Diagnosis not present

## 2022-09-19 DIAGNOSIS — N179 Acute kidney failure, unspecified: Secondary | ICD-10-CM | POA: Diagnosis not present

## 2022-09-19 DIAGNOSIS — I5032 Chronic diastolic (congestive) heart failure: Secondary | ICD-10-CM | POA: Diagnosis not present

## 2022-09-19 DIAGNOSIS — E669 Obesity, unspecified: Secondary | ICD-10-CM | POA: Diagnosis not present

## 2022-09-19 DIAGNOSIS — E039 Hypothyroidism, unspecified: Secondary | ICD-10-CM | POA: Diagnosis not present

## 2022-09-19 DIAGNOSIS — Z7982 Long term (current) use of aspirin: Secondary | ICD-10-CM | POA: Diagnosis not present

## 2022-09-19 DIAGNOSIS — I2510 Atherosclerotic heart disease of native coronary artery without angina pectoris: Secondary | ICD-10-CM | POA: Diagnosis not present

## 2022-09-19 DIAGNOSIS — I110 Hypertensive heart disease with heart failure: Secondary | ICD-10-CM | POA: Diagnosis not present

## 2022-09-19 DIAGNOSIS — J9611 Chronic respiratory failure with hypoxia: Secondary | ICD-10-CM | POA: Diagnosis not present

## 2022-09-19 DIAGNOSIS — J449 Chronic obstructive pulmonary disease, unspecified: Secondary | ICD-10-CM | POA: Diagnosis not present

## 2022-09-19 DIAGNOSIS — E1151 Type 2 diabetes mellitus with diabetic peripheral angiopathy without gangrene: Secondary | ICD-10-CM | POA: Diagnosis not present

## 2022-09-19 DIAGNOSIS — Z794 Long term (current) use of insulin: Secondary | ICD-10-CM | POA: Diagnosis not present

## 2022-09-19 DIAGNOSIS — I480 Paroxysmal atrial fibrillation: Secondary | ICD-10-CM | POA: Diagnosis not present

## 2022-09-19 DIAGNOSIS — G4733 Obstructive sleep apnea (adult) (pediatric): Secondary | ICD-10-CM | POA: Diagnosis not present

## 2022-09-19 DIAGNOSIS — N400 Enlarged prostate without lower urinary tract symptoms: Secondary | ICD-10-CM | POA: Diagnosis not present

## 2022-09-19 DIAGNOSIS — Z7901 Long term (current) use of anticoagulants: Secondary | ICD-10-CM | POA: Diagnosis not present

## 2022-09-19 DIAGNOSIS — Z4781 Encounter for orthopedic aftercare following surgical amputation: Secondary | ICD-10-CM | POA: Diagnosis not present

## 2022-09-24 DIAGNOSIS — G4733 Obstructive sleep apnea (adult) (pediatric): Secondary | ICD-10-CM | POA: Diagnosis not present

## 2022-09-24 DIAGNOSIS — Z7982 Long term (current) use of aspirin: Secondary | ICD-10-CM | POA: Diagnosis not present

## 2022-09-24 DIAGNOSIS — I2510 Atherosclerotic heart disease of native coronary artery without angina pectoris: Secondary | ICD-10-CM | POA: Diagnosis not present

## 2022-09-24 DIAGNOSIS — J449 Chronic obstructive pulmonary disease, unspecified: Secondary | ICD-10-CM | POA: Diagnosis not present

## 2022-09-24 DIAGNOSIS — J9611 Chronic respiratory failure with hypoxia: Secondary | ICD-10-CM | POA: Diagnosis not present

## 2022-09-24 DIAGNOSIS — I5032 Chronic diastolic (congestive) heart failure: Secondary | ICD-10-CM | POA: Diagnosis not present

## 2022-09-24 DIAGNOSIS — N179 Acute kidney failure, unspecified: Secondary | ICD-10-CM | POA: Diagnosis not present

## 2022-09-24 DIAGNOSIS — Z7901 Long term (current) use of anticoagulants: Secondary | ICD-10-CM | POA: Diagnosis not present

## 2022-09-24 DIAGNOSIS — E039 Hypothyroidism, unspecified: Secondary | ICD-10-CM | POA: Diagnosis not present

## 2022-09-24 DIAGNOSIS — N400 Enlarged prostate without lower urinary tract symptoms: Secondary | ICD-10-CM | POA: Diagnosis not present

## 2022-09-24 DIAGNOSIS — E1151 Type 2 diabetes mellitus with diabetic peripheral angiopathy without gangrene: Secondary | ICD-10-CM | POA: Diagnosis not present

## 2022-09-24 DIAGNOSIS — I480 Paroxysmal atrial fibrillation: Secondary | ICD-10-CM | POA: Diagnosis not present

## 2022-09-24 DIAGNOSIS — Z4781 Encounter for orthopedic aftercare following surgical amputation: Secondary | ICD-10-CM | POA: Diagnosis not present

## 2022-09-24 DIAGNOSIS — E669 Obesity, unspecified: Secondary | ICD-10-CM | POA: Diagnosis not present

## 2022-09-24 DIAGNOSIS — Z794 Long term (current) use of insulin: Secondary | ICD-10-CM | POA: Diagnosis not present

## 2022-09-24 DIAGNOSIS — I110 Hypertensive heart disease with heart failure: Secondary | ICD-10-CM | POA: Diagnosis not present

## 2022-09-26 DIAGNOSIS — N400 Enlarged prostate without lower urinary tract symptoms: Secondary | ICD-10-CM | POA: Diagnosis not present

## 2022-09-26 DIAGNOSIS — I480 Paroxysmal atrial fibrillation: Secondary | ICD-10-CM | POA: Diagnosis not present

## 2022-09-26 DIAGNOSIS — I110 Hypertensive heart disease with heart failure: Secondary | ICD-10-CM | POA: Diagnosis not present

## 2022-09-26 DIAGNOSIS — Z794 Long term (current) use of insulin: Secondary | ICD-10-CM | POA: Diagnosis not present

## 2022-09-26 DIAGNOSIS — N179 Acute kidney failure, unspecified: Secondary | ICD-10-CM | POA: Diagnosis not present

## 2022-09-26 DIAGNOSIS — I5032 Chronic diastolic (congestive) heart failure: Secondary | ICD-10-CM | POA: Diagnosis not present

## 2022-09-26 DIAGNOSIS — Z7901 Long term (current) use of anticoagulants: Secondary | ICD-10-CM | POA: Diagnosis not present

## 2022-09-26 DIAGNOSIS — G4733 Obstructive sleep apnea (adult) (pediatric): Secondary | ICD-10-CM | POA: Diagnosis not present

## 2022-09-26 DIAGNOSIS — I2510 Atherosclerotic heart disease of native coronary artery without angina pectoris: Secondary | ICD-10-CM | POA: Diagnosis not present

## 2022-09-26 DIAGNOSIS — Z7982 Long term (current) use of aspirin: Secondary | ICD-10-CM | POA: Diagnosis not present

## 2022-09-26 DIAGNOSIS — Z4781 Encounter for orthopedic aftercare following surgical amputation: Secondary | ICD-10-CM | POA: Diagnosis not present

## 2022-09-26 DIAGNOSIS — E669 Obesity, unspecified: Secondary | ICD-10-CM | POA: Diagnosis not present

## 2022-09-26 DIAGNOSIS — J449 Chronic obstructive pulmonary disease, unspecified: Secondary | ICD-10-CM | POA: Diagnosis not present

## 2022-09-26 DIAGNOSIS — J9611 Chronic respiratory failure with hypoxia: Secondary | ICD-10-CM | POA: Diagnosis not present

## 2022-09-26 DIAGNOSIS — E1151 Type 2 diabetes mellitus with diabetic peripheral angiopathy without gangrene: Secondary | ICD-10-CM | POA: Diagnosis not present

## 2022-09-26 DIAGNOSIS — E039 Hypothyroidism, unspecified: Secondary | ICD-10-CM | POA: Diagnosis not present

## 2022-09-28 DIAGNOSIS — E039 Hypothyroidism, unspecified: Secondary | ICD-10-CM | POA: Diagnosis not present

## 2022-09-28 DIAGNOSIS — I480 Paroxysmal atrial fibrillation: Secondary | ICD-10-CM | POA: Diagnosis not present

## 2022-09-28 DIAGNOSIS — E118 Type 2 diabetes mellitus with unspecified complications: Secondary | ICD-10-CM | POA: Diagnosis not present

## 2022-09-28 DIAGNOSIS — E7800 Pure hypercholesterolemia, unspecified: Secondary | ICD-10-CM | POA: Diagnosis not present

## 2022-09-28 DIAGNOSIS — I10 Essential (primary) hypertension: Secondary | ICD-10-CM | POA: Diagnosis not present

## 2022-09-28 DIAGNOSIS — Z79899 Other long term (current) drug therapy: Secondary | ICD-10-CM | POA: Diagnosis not present

## 2022-09-28 DIAGNOSIS — Z0000 Encounter for general adult medical examination without abnormal findings: Secondary | ICD-10-CM | POA: Diagnosis not present

## 2022-09-28 DIAGNOSIS — I2510 Atherosclerotic heart disease of native coronary artery without angina pectoris: Secondary | ICD-10-CM | POA: Diagnosis not present

## 2022-09-28 DIAGNOSIS — Z125 Encounter for screening for malignant neoplasm of prostate: Secondary | ICD-10-CM | POA: Diagnosis not present

## 2022-10-18 ENCOUNTER — Encounter (INDEPENDENT_AMBULATORY_CARE_PROVIDER_SITE_OTHER): Payer: Self-pay | Admitting: Nurse Practitioner

## 2022-10-18 ENCOUNTER — Encounter (INDEPENDENT_AMBULATORY_CARE_PROVIDER_SITE_OTHER): Payer: Medicare Other

## 2022-10-18 ENCOUNTER — Ambulatory Visit (INDEPENDENT_AMBULATORY_CARE_PROVIDER_SITE_OTHER): Payer: Medicare Other | Admitting: Nurse Practitioner

## 2022-10-18 VITALS — BP 117/66 | HR 89 | Resp 16

## 2022-10-18 DIAGNOSIS — Z89511 Acquired absence of right leg below knee: Secondary | ICD-10-CM

## 2022-10-18 DIAGNOSIS — S81801S Unspecified open wound, right lower leg, sequela: Secondary | ICD-10-CM

## 2022-10-19 ENCOUNTER — Encounter (INDEPENDENT_AMBULATORY_CARE_PROVIDER_SITE_OTHER): Payer: Self-pay | Admitting: Nurse Practitioner

## 2022-10-19 NOTE — Progress Notes (Signed)
Subjective:    Patient ID: [name]., male    DOB: 1955-08-19, 67 y.o.   MRN: [id number] Chief Complaint  Patient presents with   Follow-up    6 month follow up    [name]. is a 67 y.o. male.  Patient returns in follow-up after his right amputation.  He is about 2 months status post right below-knee amputation.  His wound is almost completely healed except for a very small opening on the medial portion with minimal drainage.  No fevers or chills.  No erythema or signs of infection.  This area has been somewhat persistent since his last visit.  No evidence of infection is present today.    Review of Systems  Skin:  Positive for wound.  All other systems reviewed and are negative.      Objective:   Physical Exam Vitals reviewed.  HENT:     Head: Normocephalic.  Cardiovascular:     Rate and Rhythm: Normal rate.  Pulmonary:     Effort: Pulmonary effort is normal.  Musculoskeletal:     Right Lower Extremity: Right leg is amputated below knee.     Left Lower Extremity: Left leg is amputated below knee.  Skin:    General: Skin is warm and dry.  Neurological:     Mental Status: He is alert and oriented to person, place, and time.  Psychiatric:        Mood and Affect: Mood normal.        Behavior: Behavior normal.        Thought Content: Thought content normal.        Judgment: Judgment normal.     BP 117/66 (BP Location: Left Arm)   Pulse 89   Resp 16   Past Medical History:  Diagnosis Date   Arrhythmia    atrial fibrillation   Asthma    CHF (congestive heart failure) (HCC)    COPD (chronic obstructive pulmonary disease) (HCC)    Coronary artery disease    Depression    Diabetes mellitus without complication (HCC)    Gout    History anabolic steroid use    Hyperlipidemia    Hypertension    Hypogonadism in male    MI (myocardial infarction) (HCC)    Morbid obesity (HCC)    Myocardial infarction (HCC)    Peripheral vascular disease  (HCC)    Perirectal abscess    Pleurisy    Sleep apnea    CPAP at night, no oxygen   Varicella     Social History   Socioeconomic History   Marital status: Married    Spouse name: Not on file   Number of children: Not on file   Years of education: Not on file   Highest education level: Not on file  Occupational History   Not on file  Tobacco Use   Smoking status: Former    Packs/day: 0.50    Years: 45.00    Additional pack years: 0.00    Total pack years: 22.50    Types: Cigarettes    Quit date: 04/05/2015    Years since quitting: 7.5   Smokeless tobacco: Never  Vaping Use   Vaping Use: Every day  Substance and Sexual Activity   Alcohol use: Not Currently    Alcohol/week: 3.0 standard drinks of alcohol    Types: 3 Glasses of wine per week   Drug use: No   Sexual activity: Not on file  Other Topics  Concern   Not on file  Social History Narrative   Not on file   Social Determinants of Health   Financial Resource Strain: Not on file  Food Insecurity: No Food Insecurity (05/19/2022)   Hunger Vital Sign    Worried About Running Out of Food in the Last Year: Never true    Ran Out of Food in the Last Year: Never true  Transportation Needs: No Transportation Needs (05/19/2022)   PRAPARE - Administrator, Civil Service (Medical): No    Lack of Transportation (Non-Medical): No  Recent Concern: Transportation Needs - Unmet Transportation Needs (03/21/2022)   PRAPARE - Transportation    Lack of Transportation (Medical): Yes    Lack of Transportation (Non-Medical): Yes  Physical Activity: Not on file  Stress: Not on file  Social Connections: Not on file  Intimate Partner Violence: Not At Risk (05/19/2022)   Humiliation, Afraid, Rape, and Kick questionnaire    Fear of Current or Ex-Partner: No    Emotionally Abused: No    Physically Abused: No    Sexually Abused: No    Past Surgical History:  Procedure Laterality Date   ABDOMINAL AORTIC ANEURYSM REPAIR      ACHILLES TENDON SURGERY Left 01/10/2021   Procedure: ACHILLES LENGTHENING/KIDNER;  Surgeon: Rosetta Posner, DPM;  Location: [facility];  Service: Podiatry;  Laterality: Left;   AMPUTATION Left 10/03/2021   Procedure: AMPUTATION BELOW KNEE;  Surgeon: Renford Dills, MD;  Location: [facility];  Service: Vascular;  Laterality: Left;   AMPUTATION Right 05/24/2022   Procedure: AMPUTATION BELOW KNEE;  Surgeon: Annice Needy, MD;  Location: [facility];  Service: General;  Laterality: Right;   AMPUTATION TOE Right 02/10/2016   Procedure: AMPUTATION TOE 3RD TOE;  Surgeon: Gwyneth Revels, DPM;  Location: [facility];  Service: Podiatry;  Laterality: Right;   AMPUTATION TOE Left 02/24/2020   Procedure: AMPUTATION TOE;  Surgeon: Rosetta Posner, DPM;  Location: [facility];  Service: Podiatry;  Laterality: Left;   APPLICATION OF WOUND VAC Left 02/29/2020   Procedure: APPLICATION OF WOUND VAC;  Surgeon: Rosetta Posner, DPM;  Location: [facility];  Service: Podiatry;  Laterality: Left;   CARDIAC CATHETERIZATION     CARDIOVERSION [name] 02/13/2022   Procedure: CARDIOVERSION;  Surgeon: Lamar Blinks, MD;  Location: [facility];  Service: Cardiovascular;  Laterality: [name];   COLONOSCOPY WITH PROPOFOL [name] 11/18/2015   Procedure: COLONOSCOPY WITH PROPOFOL;  Surgeon: Scot Jun, MD;  Location: [facility];  Service: Endoscopy;  Laterality: [name];   CORONARY ARTERY BYPASS GRAFT     CORONARY STENT INTERVENTION [name] 02/02/2020   Procedure: CORONARY STENT INTERVENTION;  Surgeon: Marcina Millard, MD;  Location: [facility];  Service: Cardiovascular;  Laterality: [name];   INCISION AND DRAINAGE Left 08/07/2021   Procedure: INCISION AND DRAINAGE-Partial Calcanectomy;  Surgeon: Rosetta Posner, DPM;  Location: [facility];  Service: Podiatry;  Laterality: Left;   IRRIGATION AND DEBRIDEMENT FOOT Left 02/29/2020   Procedure: IRRIGATION AND DEBRIDEMENT FOOT;  Surgeon: Rosetta Posner, DPM;  Location: [facility];  Service: Podiatry;   Laterality: Left;   IRRIGATION AND DEBRIDEMENT FOOT Left 02/24/2020   Procedure: IRRIGATION AND DEBRIDEMENT FOOT;  Surgeon: Rosetta Posner, DPM;  Location: [facility];  Service: Podiatry;  Laterality: Left;   KNEE ARTHROSCOPY     LEFT HEART CATH AND CORS/GRAFTS ANGIOGRAPHY [name] 02/02/2020   Procedure: LEFT HEART CATH AND CORS/GRAFTS ANGIOGRAPHY;  Surgeon: Dalia Heading, MD;  Location: [facility];  Service: Cardiovascular;  Laterality: [name];   LOWER EXTREMITY ANGIOGRAPHY Left 02/25/2020   Procedure: Lower Extremity Angiography;  Surgeon: Annice Needy, MD;  Location: [facility];  Service: Cardiovascular;  Laterality: Left;   LOWER EXTREMITY ANGIOGRAPHY Left 01/04/2021   Procedure: LOWER EXTREMITY ANGIOGRAPHY;  Surgeon: Annice Needy, MD;  Location: [facility];  Service: Cardiovascular;  Laterality: Left;   LOWER EXTREMITY ANGIOGRAPHY Right 05/21/2022   Procedure: Lower Extremity Angiography;  Surgeon: Annice Needy, MD;  Location: [facility];  Service: Cardiovascular;  Laterality: Right;   METATARSAL HEAD EXCISION Left 01/10/2021   Procedure: METATARSAL HEAD EXCISION - LEFT 5th;  Surgeon: Rosetta Posner, DPM;  Location: [facility];  Service: Podiatry;  Laterality: Left;   PERIPHERAL VASCULAR CATHETERIZATION Right 01/24/2016   Procedure: Lower Extremity Angiography;  Surgeon: Renford Dills, MD;  Location: [facility];  Service: Cardiovascular;  Laterality: Right;   PERIPHERAL VASCULAR CATHETERIZATION Right 01/25/2016   Procedure: Lower Extremity Angiography;  Surgeon: Renford Dills, MD;  Location: [facility];  Service: Cardiovascular;  Laterality: Right;   TOE AMPUTATION     TONSILLECTOMY      Family History  Problem Relation Age of Onset   Hypertension Father    Coronary artery disease Father    Alcohol abuse Father    Heart failure Brother     Allergies  Allergen Reactions   Penicillins Other (See Comments)    Happened at 67  years old and pt. stated he passed out  He has tolerated amoxicillin/clavulanate and ampicillin/sulbactam   Statins     Other reaction(s): Muscle Pain Causes legs to ache per pt   Metformin And Related Diarrhea       Latest Ref Rng & Units 06/06/2022    2:59 AM 06/05/2022    3:21 AM 06/04/2022    3:43 AM  CBC  WBC 4.0 - 10.5 K/uL 9.8  10.9  11.0   Hemoglobin 13.0 - 17.0 g/dL 8.5  8.4  8.5   Hematocrit 39.0 - 52.0 % 27.6  27.4  27.6   Platelets 150 - 400 K/uL 310  349  357       CMP     Component Value Date/Time   NA 135 06/06/2022 0259   NA 141 07/12/2011 0919   K 4.6 06/06/2022 0259   K 4.4 07/12/2011 0919   CL 103 06/06/2022 0259   CL 102 07/12/2011 0919   CO2 26 06/06/2022 0259   CO2 29 07/12/2011 0919   GLUCOSE 161 (H) 06/06/2022 0259   GLUCOSE 76 07/12/2011 0919   BUN 49 (H) 06/06/2022 0259   BUN 16 05/05/2013 1022   CREATININE 1.90 (H) 08/13/2022 1412   CREATININE 0.71 05/05/2013 1022   CALCIUM 8.8 (L) 06/06/2022 0259   CALCIUM 8.8 07/12/2011 0919   PROT 7.4 05/18/2022 1527   ALBUMIN 2.6 (L) 05/18/2022 1527   AST 18 05/18/2022 1527   ALT 25 05/18/2022 1527   ALKPHOS 170 (H) 05/18/2022 1527   BILITOT 0.3 05/18/2022 1527   GFRNONAA >60 06/06/2022 0259   GFRNONAA >60 05/05/2013 1022   GFRAA >60 01/06/2020 1853   GFRAA >60 05/05/2013 1022     No results found.     Assessment & Plan:   1. Hx of BKA, right (HCC) The patient has just a small amount of fibrinous exudate in the itself.  This to be changed daily.  He has his prosthetic and will begin using it shortly.  Ensure  full as well as to ensure it is not worsening in the setting of having his prosthetic.  2. Essential hypertension Continue antihypertensive medications as already ordered, these medications have been reviewed and there are no changes at this time.   Current Outpatient Medications on File Prior to Visit  Medication Sig Dispense Refill   acetaminophen (TYLENOL) 325 MG tablet Take 2 tablets  (650 mg total) by mouth every 6 (six) hours as needed for mild pain (or Fever >/= 101).     amiodarone (PACERONE) 200 MG tablet Take 1 tablet (200 mg total) by mouth daily. 90 tablet 3   apixaban (ELIQUIS) 5 MG TABS tablet Take 5 mg by mouth 2 (two) times daily.     aspirin EC 81 MG tablet Take 81 mg by mouth daily. Swallow whole.     budesonide (PULMICORT) 0.5 MG/2ML nebulizer solution Take 2 mLs (0.5 mg total) by nebulization 2 (two) times daily. 120 mL 0   Cholecalciferol 25 MCG (1000 UT) tablet Take 1,000 Units by mouth daily.     Continuous Blood Gluc Sensor (FREESTYLE LIBRE 3 SENSOR) MISC      Cyanocobalamin (VITAMIN B-12) 5000 MCG TBDP Take 10,000 mcg by mouth daily.     diltiazem (CARDIZEM CD) 300 MG 24 hr capsule Take 1 capsule (300 mg total) by mouth daily. 90 capsule 1   ezetimibe (ZETIA) 10 MG tablet Take 10 mg by mouth at bedtime.     ferrous sulfate 325 (65 FE) MG tablet Take 325 mg by mouth daily with breakfast.     finasteride (PROSCAR) 5 MG tablet Take 1 tablet (5 mg total) by mouth daily. 30 tablet 0   furosemide (LASIX) 20 MG tablet Take 1 tablet (20 mg total) by mouth ONCE OR TWICE daily (total daily dose maximum 40 mg) as needed for increased swelling, shortness of breath, weight gain 5+ lbs over 1-2 days. Seek medical care if these symptoms are not improving with increased dose, or if you become dizzy or have low blood pressure. 30 tablet 0   gabapentin (NEURONTIN) 300 MG capsule Take 300 mg by mouth at bedtime.     HUMALOG 100 UNIT/ML injection Inject 15 Units into the skin 3 (three) times daily with meals. >200     Infant Care Products (DERMACLOUD) OINT Apply 1 Application topically daily as needed.     insulin glargine (LANTUS) 100 UNIT/ML injection Inject 15 units into the skin daily in AM and inject 25 units into the skin daily in PM 10 mL 11   isosorbide mononitrate (IMDUR) 60 MG 24 hr tablet Take 1 tablet (60 mg total) by mouth in the morning and at bedtime. 180 tablet 3    levothyroxine (SYNTHROID) 25 MCG tablet Take 1 tablet (25 mcg total) by mouth daily at 6 (six) AM. 30 tablet 0   losartan (COZAAR) 25 MG tablet Take 1 tablet (25 mg total) by mouth daily. 30 tablet 0   Multiple Vitamins-Minerals (MULTIVITAMIN WITH MINERALS) tablet Take 1 tablet by mouth daily.     nitrofurantoin, macrocrystal-monohydrate, (MACROBID) 100 MG capsule Take 100 mg by mouth daily.     nitroGLYCERIN (NITROSTAT) 0.4 MG SL tablet Place 1 tablet (0.4 mg total) under the tongue every 5 (five) minutes as needed for chest pain. 25 tablet 3   nutrition supplement, JUVEN, (JUVEN) PACK Take 1 packet by mouth 2 (two) times daily between meals. 60 packet 0   nystatin (MYCOSTATIN/NYSTOP) powder Apply 1 Application topically 2 (two) times daily.  ondansetron (ZOFRAN) 4 MG tablet Take 1 tablet (4 mg total) by mouth every 6 (six) hours as needed for nausea. 20 tablet 0   pramipexole (MIRAPEX) 1 MG tablet Take 2 mg by mouth at bedtime.     primidone (MYSOLINE) 250 MG tablet Take 250 mg by mouth 2 (two) times daily.     risperiDONE (RISPERDAL) 0.5 MG tablet Take 0.5 tablets (0.25 mg total) by mouth 2 (two) times daily. 60 tablet 0   tamsulosin (FLOMAX) 0.4 MG CAPS capsule Take 0.4 mg by mouth daily.     vitamin C (ASCORBIC ACID) 500 MG tablet Take 500 mg by mouth daily.     zinc gluconate 50 MG tablet Take 50 mg by mouth daily.     zinc sulfate 220 (50 Zn) MG capsule Take 1 capsule (220 mg total) by mouth daily.     No current facility-administered medications on file prior to visit.    There are no Patient Instructions on file for this visit. No follow-ups on file.   Georgiana Spinner, NP

## 2022-11-07 ENCOUNTER — Telehealth (INDEPENDENT_AMBULATORY_CARE_PROVIDER_SITE_OTHER): Payer: Self-pay

## 2022-11-07 NOTE — Telephone Encounter (Signed)
Patient spouse left a message checking on the status for physical therapy referral. Patient spouse informed that her husband was at getting prothesis at [facility].Patient spouse was informed that referral has been placed and she was given contact number.

## 2022-11-20 ENCOUNTER — Encounter: Payer: Self-pay | Admitting: Cardiovascular Disease

## 2022-11-20 ENCOUNTER — Ambulatory Visit: Payer: Medicare Other | Attending: Cardiovascular Disease | Admitting: Cardiovascular Disease

## 2022-11-20 VITALS — BP 126/60 | HR 79 | Ht 73.5 in

## 2022-11-20 DIAGNOSIS — I480 Paroxysmal atrial fibrillation: Secondary | ICD-10-CM | POA: Diagnosis not present

## 2022-11-20 DIAGNOSIS — I10 Essential (primary) hypertension: Secondary | ICD-10-CM

## 2022-11-20 DIAGNOSIS — E785 Hyperlipidemia, unspecified: Secondary | ICD-10-CM | POA: Diagnosis not present

## 2022-11-20 DIAGNOSIS — I25118 Atherosclerotic heart disease of native coronary artery with other forms of angina pectoris: Secondary | ICD-10-CM | POA: Diagnosis not present

## 2022-11-20 DIAGNOSIS — I5032 Chronic diastolic (congestive) heart failure: Secondary | ICD-10-CM | POA: Diagnosis not present

## 2022-11-20 DIAGNOSIS — I739 Peripheral vascular disease, unspecified: Secondary | ICD-10-CM

## 2022-11-20 NOTE — Progress Notes (Signed)
Cardiology Office Note   Date:  11/20/2022   ID:  [name], DOB 1955/07/11, MRN [id number]  PCP:  [name], MD  Cardiologist:   Lorine Bears, MD   Chief Complaint  Patient presents with   Follow-up    Patient denies new or acute cardiac problems/concerns today.        History of Present Illness: [name]. is a 67 y.o. male who is here today for follow-up visit regarding coronary artery disease, atrial fibrillation and chronic diastolic heart failure.  He has known history of coronary artery disease status post CABG, atrial fibrillation, peripheral arterial disease followed by vascular surgery, diabetes mellitus, essential hypertension, hyperlipidemia, morbid obesity and sleep apnea on BiPAP. He had remote CABG at [facility] in early 2000.  Most recent cardiac catheterization was done in 2021 and showed occluded mid LAD and mid left circumflex with severe stenosis in the mid right coronary artery.  LIMA to LAD was patent but the SVG grafts were occluded.  He underwent PCI and drug-eluting stent placement to the mid right coronary artery.  He has known history of morbid obesity and at some point his weight was 600 pounds.  He was able to lose weight with improved lifestyle changes.  He has history of paroxysmal atrial fibrillation on long-term anticoagulation with Eliquis. Most recent echocardiogram in June 2023 showed normal LV systolic function with mild biatrial enlargement and no significant valvular abnormalities.  He has peripheral arterial disease followed by vascular surgery with multiple interventions on the lower extremities.  He is status post left BKA in 2023.  He had gangrene earlier this year and ultimately underwent right BKA in January of this year.  He is waiting to be fitted with prosthesis.  He denies chest pain or worsening dyspnea.  No palpitations.    Past Medical History:  Diagnosis Date   Arrhythmia    atrial fibrillation   Asthma     CHF (congestive heart failure) (HCC)    COPD (chronic obstructive pulmonary disease) (HCC)    Coronary artery disease    Depression    Diabetes mellitus without complication (HCC)    Gout    History anabolic steroid use    Hyperlipidemia    Hypertension    Hypogonadism in male    MI (myocardial infarction) (HCC)    Morbid obesity (HCC)    Myocardial infarction (HCC)    Peripheral vascular disease (HCC)    Perirectal abscess    Pleurisy    Sleep apnea    CPAP at night, no oxygen   Varicella     Past Surgical History:  Procedure Laterality Date   ABDOMINAL AORTIC ANEURYSM REPAIR     ACHILLES TENDON SURGERY Left 01/10/2021   Procedure: ACHILLES LENGTHENING/KIDNER;  Surgeon: Rosetta Posner, DPM;  Location: [facility];  Service: Podiatry;  Laterality: Left;   AMPUTATION Left 10/03/2021   Procedure: AMPUTATION BELOW KNEE;  Surgeon: Renford Dills, MD;  Location: [facility];  Service: Vascular;  Laterality: Left;   AMPUTATION Right 05/24/2022   Procedure: AMPUTATION BELOW KNEE;  Surgeon: Annice Needy, MD;  Location: [facility];  Service: General;  Laterality: Right;   AMPUTATION TOE Right 02/10/2016   Procedure: AMPUTATION TOE 3RD TOE;  Surgeon: Gwyneth Revels, DPM;  Location: [facility];  Service: Podiatry;  Laterality: Right;   AMPUTATION TOE Left 02/24/2020   Procedure: AMPUTATION TOE;  Surgeon: Rosetta Posner, DPM;  Location: [facility];  Service: Podiatry;  Laterality:  Left;   APPLICATION OF WOUND VAC Left 02/29/2020   Procedure: APPLICATION OF WOUND VAC;  Surgeon: Rosetta Posner, DPM;  Location: [facility];  Service: Podiatry;  Laterality: Left;   CARDIAC CATHETERIZATION     CARDIOVERSION [name] 02/13/2022   Procedure: CARDIOVERSION;  Surgeon: Lamar Blinks, MD;  Location: [facility];  Service: Cardiovascular;  Laterality: [name];   COLONOSCOPY WITH PROPOFOL [name] 11/18/2015   Procedure: COLONOSCOPY WITH PROPOFOL;  Surgeon: Scot Jun, MD;  Location: [facility];  Service: Endoscopy;   Laterality: [name];   CORONARY ARTERY BYPASS GRAFT     CORONARY STENT INTERVENTION [name] 02/02/2020   Procedure: CORONARY STENT INTERVENTION;  Surgeon: Marcina Millard, MD;  Location: [facility];  Service: Cardiovascular;  Laterality: [name];   INCISION AND DRAINAGE Left 08/07/2021   Procedure: INCISION AND DRAINAGE-Partial Calcanectomy;  Surgeon: Rosetta Posner, DPM;  Location: [facility];  Service: Podiatry;  Laterality: Left;   IRRIGATION AND DEBRIDEMENT FOOT Left 02/29/2020   Procedure: IRRIGATION AND DEBRIDEMENT FOOT;  Surgeon: Rosetta Posner, DPM;  Location: [facility];  Service: Podiatry;  Laterality: Left;   IRRIGATION AND DEBRIDEMENT FOOT Left 02/24/2020   Procedure: IRRIGATION AND DEBRIDEMENT FOOT;  Surgeon: Rosetta Posner, DPM;  Location: [facility];  Service: Podiatry;  Laterality: Left;   KNEE ARTHROSCOPY     LEFT HEART CATH AND CORS/GRAFTS ANGIOGRAPHY [name] 02/02/2020   Procedure: LEFT HEART CATH AND CORS/GRAFTS ANGIOGRAPHY;  Surgeon: Dalia Heading, MD;  Location: [facility];  Service: Cardiovascular;  Laterality: [name];   LOWER EXTREMITY ANGIOGRAPHY Left 02/25/2020   Procedure: Lower Extremity Angiography;  Surgeon: Annice Needy, MD;  Location: [facility];  Service: Cardiovascular;  Laterality: Left;   LOWER EXTREMITY ANGIOGRAPHY Left 01/04/2021   Procedure: LOWER EXTREMITY ANGIOGRAPHY;  Surgeon: Annice Needy, MD;  Location: [facility];  Service: Cardiovascular;  Laterality: Left;   LOWER EXTREMITY ANGIOGRAPHY Right 05/21/2022   Procedure: Lower Extremity Angiography;  Surgeon: Annice Needy, MD;  Location: [facility];  Service: Cardiovascular;  Laterality: Right;   METATARSAL HEAD EXCISION Left 01/10/2021   Procedure: METATARSAL HEAD EXCISION - LEFT 5th;  Surgeon: Rosetta Posner, DPM;  Location: [facility];  Service: Podiatry;  Laterality: Left;   PERIPHERAL VASCULAR CATHETERIZATION Right 01/24/2016   Procedure: Lower Extremity Angiography;   Surgeon: Renford Dills, MD;  Location: [facility];  Service: Cardiovascular;  Laterality: Right;   PERIPHERAL VASCULAR CATHETERIZATION Right 01/25/2016   Procedure: Lower Extremity Angiography;  Surgeon: Renford Dills, MD;  Location: [facility];  Service: Cardiovascular;  Laterality: Right;   TOE AMPUTATION     TONSILLECTOMY       Current Outpatient Medications  Medication Sig Dispense Refill   acetaminophen (TYLENOL) 325 MG tablet Take 2 tablets (650 mg total) by mouth every 6 (six) hours as needed for mild pain (or Fever >/= 101).     amiodarone (PACERONE) 200 MG tablet Take 1 tablet (200 mg total) by mouth daily. 90 tablet 3   apixaban (ELIQUIS) 5 MG TABS tablet Take 5 mg by mouth 2 (two) times daily.     aspirin EC 81 MG tablet Take 81 mg by mouth daily. Swallow whole.     budesonide (PULMICORT) 0.5 MG/2ML nebulizer solution Take 2 mLs (0.5 mg total) by nebulization 2 (two) times daily. 120 mL 0   Cholecalciferol 25 MCG (1000 UT) tablet Take 1,000 Units by mouth daily.     Continuous Blood  Gluc Sensor (FREESTYLE LIBRE 3 SENSOR) MISC      Cyanocobalamin (VITAMIN B-12) 5000 MCG TBDP Take 10,000 mcg by mouth daily.     diltiazem (CARDIZEM CD) 300 MG 24 hr capsule Take 1 capsule (300 mg total) by mouth daily. 90 capsule 1   ezetimibe (ZETIA) 10 MG tablet Take 10 mg by mouth at bedtime.     ferrous sulfate 325 (65 FE) MG tablet Take 325 mg by mouth daily with breakfast.     finasteride (PROSCAR) 5 MG tablet Take 1 tablet (5 mg total) by mouth daily. 30 tablet 0   furosemide (LASIX) 20 MG tablet Take 1 tablet (20 mg total) by mouth ONCE OR TWICE daily (total daily dose maximum 40 mg) as needed for increased swelling, shortness of breath, weight gain 5+ lbs over 1-2 days. Seek medical care if these symptoms are not improving with increased dose, or if you become dizzy or have low blood pressure. 30 tablet 0   gabapentin (NEURONTIN) 300 MG capsule Take 300 mg by mouth  at bedtime.     HUMALOG 100 UNIT/ML injection Inject 15 Units into the skin 3 (three) times daily with meals. >200     Infant Care Products (DERMACLOUD) OINT Apply 1 Application topically daily as needed.     insulin glargine (LANTUS) 100 UNIT/ML injection Inject 15 units into the skin daily in AM and inject 25 units into the skin daily in PM 10 mL 11   isosorbide mononitrate (IMDUR) 60 MG 24 hr tablet Take 1 tablet (60 mg total) by mouth in the morning and at bedtime. 180 tablet 3   levothyroxine (SYNTHROID) 25 MCG tablet Take 1 tablet (25 mcg total) by mouth daily at 6 (six) AM. 30 tablet 0   losartan (COZAAR) 25 MG tablet Take 1 tablet (25 mg total) by mouth daily. 30 tablet 0   Multiple Vitamins-Minerals (MULTIVITAMIN WITH MINERALS) tablet Take 1 tablet by mouth daily.     nitrofurantoin, macrocrystal-monohydrate, (MACROBID) 100 MG capsule Take 100 mg by mouth daily.     nitroGLYCERIN (NITROSTAT) 0.4 MG SL tablet Place 1 tablet (0.4 mg total) under the tongue every 5 (five) minutes as needed for chest pain. 25 tablet 3   nutrition supplement, JUVEN, (JUVEN) PACK Take 1 packet by mouth 2 (two) times daily between meals. 60 packet 0   nystatin (MYCOSTATIN/NYSTOP) powder Apply 1 Application topically 2 (two) times daily.     ondansetron (ZOFRAN) 4 MG tablet Take 1 tablet (4 mg total) by mouth every 6 (six) hours as needed for nausea. 20 tablet 0   pramipexole (MIRAPEX) 1 MG tablet Take 2 mg by mouth at bedtime.     primidone (MYSOLINE) 250 MG tablet Take 250 mg by mouth 2 (two) times daily.     risperiDONE (RISPERDAL) 0.5 MG tablet Take 0.5 tablets (0.25 mg total) by mouth 2 (two) times daily. 60 tablet 0   tamsulosin (FLOMAX) 0.4 MG CAPS capsule Take 0.4 mg by mouth daily.     vitamin C (ASCORBIC ACID) 500 MG tablet Take 500 mg by mouth daily.     zinc gluconate 50 MG tablet Take 50 mg by mouth daily.     zinc sulfate 220 (50 Zn) MG capsule Take 1 capsule (220 mg total) by mouth daily.     No  current facility-administered medications for this visit.    Allergies:   Penicillins, Statins, and Metformin and related    Social History:  The patient  reports that  he quit smoking about 7 years ago. His smoking use included cigarettes. He started smoking about 52 years ago. He has a 22.5 pack-year smoking history. He has never used smokeless tobacco. He reports that he does not currently use alcohol after a past usage of about 3.0 standard drinks of alcohol per week. He reports that he does not use drugs.   Family History:  The patient's family history includes Alcohol abuse in his father; Coronary artery disease in his father; Heart failure in his brother; Hypertension in his father.    ROS:  Please see the history of present illness.   Otherwise, review of systems are positive for none.   All other systems are reviewed and negative.    PHYSICAL EXAM: VS:  BP 126/60 (BP Location: Left Arm, Patient Position: Sitting, Cuff Size: Normal)   Pulse 79   Ht 6' 1.5" (1.867 m)   SpO2 94%   BMI 38.91 kg/m  , BMI Body mass index is 38.91 kg/m. GEN: Well nourished, well developed, in no acute distress  HEENT: normal  Neck: no JVD, carotid bruits, or masses Cardiac: RRR; no murmurs, rubs, or gallops, bilateral below the knee amputation Respiratory:  clear to auscultation bilaterally, normal work of breathing GI: soft, nontender, nondistended, + BS MS: no deformity or atrophy  Skin: warm and dry, no rash Neuro:  Strength and sensation are intact Psych: euthymic mood, full affect   EKG:  EKG is ordered today. The ekg ordered today demonstrates : Normal sinus rhythm Left axis deviation Right bundle branch block When compared with ECG of 24-May-2022 16:26, No significant change was found    Recent Labs: 03/16/2022: B Natriuretic Peptide 205.4 03/19/2022: Magnesium 1.9 05/18/2022: ALT 25 05/26/2022: TSH 2.407 06/06/2022: BUN 49; Hemoglobin 8.5; Platelets 310; Potassium 4.6; Sodium  135 08/13/2022: Creatinine, Ser 1.90    Lipid Panel No results found for: "CHOL", "TRIG", "HDL", "CHOLHDL", "VLDL", "LDLCALC", "LDLDIRECT"    Wt Readings from Last 3 Encounters:  08/24/22 299 lb (135.6 kg)  08/15/22 299 lb (135.6 kg)  05/18/22 298 lb 12.8 oz (135.5 kg)          05/15/2022    3:08 PM  PAD Screen  Previous PAD dx? Yes  Previous surgical procedure? Yes  Pain with walking? No  Feet/toe relief with dangling? No  Painful, non-healing ulcers? No  Extremities discolored? No      ASSESSMENT AND PLAN:  1.  Coronary artery disease involving native coronary arteries with other forms of angina: He is doing reasonably well overall.  His symptoms are controlled with diltiazem and Imdur.  Most recent PCI was in 2021 on the right coronary artery.  His LIMA to LAD was patent but SVG grafts were occluded.  Left circumflex is occluded with no patent vein graft.  Continue aggressive medical therapy.  Given the presence of coronary artery disease and peripheral arterial disease, I elected to keep him on aspirin even though he is on anticoagulation with Eliquis.  2.  Paroxysmal atrial fibrillation: Currently in sinus rhythm on amiodarone and diltiazem.  His LFTs in May were normal.  TSH in January was normal.  He is on long-term anticoagulation with Eliquis.  3.  Chronic diastolic heart failure: He appears to be euvolemic on current dose of furosemide.  Not a good candidate for an SGLT2 inhibitor due to increased risk of infection.  4.  Peripheral arterial disease: Followed by vascular surgery.  Status post bilateral BKA  5.  Hyperlipidemia: Intolerance to statins  due to myalgia.  He is currently on Zetia 10 mg once daily.  Most recent lipid profile showed an LDL of 78.  6.  Essential hypertension: Blood pressure is controlled.    Disposition:   FU with me in 6 months  Signed,  Lorine Bears, MD  11/20/2022 2:17 PM    [facility]

## 2022-11-20 NOTE — Patient Instructions (Signed)
Medication Instructions:  No changes *If you need a refill on your cardiac medications before your next appointment, please call your pharmacy*   Lab Work: None ordered If you have labs (blood work) drawn today and your tests are completely normal, you will receive your results only by: MyChart Message (if you have MyChart) OR A paper copy in the mail If you have any lab test that is abnormal or we need to change your treatment, we will call you to review the results.   Testing/Procedures: None ordered   Follow-Up: At [facility], you and your health needs are our priority.  As part of our continuing mission to provide you with exceptional heart care, we have created designated Provider Care Teams.  These Care Teams include your primary Cardiologist (physician) and Advanced Practice Providers (APPs -  Physician Assistants and Nurse Practitioners) who all work together to provide you with the care you need, when you need it.  We recommend signing up for the patient portal called "MyChart".  Sign up information is provided on this After Visit Summary.  MyChart is used to connect with patients for Virtual Visits (Telemedicine).  Patients are able to view lab/test results, encounter notes, upcoming appointments, etc.  Non-urgent messages can be sent to your provider as well.   To learn more about what you can do with MyChart, go to [email or web address].    Your next appointment:   6 month(s)  Provider:   You may see Muhammad Arida, MD or one of the following Advanced Practice Providers on your designated Care Team:   Christopher Berge, NP Ryan Dunn, PA-C Cadence Furth, PA-C [name] Hammock, NP    

## 2022-11-28 ENCOUNTER — Ambulatory Visit: Payer: Medicare Other | Attending: Nurse Practitioner

## 2022-11-28 DIAGNOSIS — Z89511 Acquired absence of right leg below knee: Secondary | ICD-10-CM | POA: Diagnosis not present

## 2022-11-28 DIAGNOSIS — M6281 Muscle weakness (generalized): Secondary | ICD-10-CM | POA: Diagnosis present

## 2022-11-28 DIAGNOSIS — R2681 Unsteadiness on feet: Secondary | ICD-10-CM | POA: Diagnosis present

## 2022-11-28 DIAGNOSIS — R278 Other lack of coordination: Secondary | ICD-10-CM | POA: Insufficient documentation

## 2022-11-28 DIAGNOSIS — R262 Difficulty in walking, not elsewhere classified: Secondary | ICD-10-CM | POA: Insufficient documentation

## 2022-11-28 DIAGNOSIS — R2689 Other abnormalities of gait and mobility: Secondary | ICD-10-CM | POA: Insufficient documentation

## 2022-11-28 DIAGNOSIS — R269 Unspecified abnormalities of gait and mobility: Secondary | ICD-10-CM | POA: Insufficient documentation

## 2022-11-28 NOTE — Therapy (Signed)
OUTPATIENT PHYSICAL THERAPY LOWER EXTREMITY EVALUATION   Patient Name: [name]. MRN: [id number] DOB:1955-07-24, 67 y.o., male Today's Date: 11/28/2022  END OF SESSION:  PT End of Session - 11/28/22 1658     Visit Number 1    Number of Visits 24    Date for PT Re-Evaluation 02/20/23    Progress Note Due on Visit 10    PT Start Time 1445    PT Stop Time 1532    PT Time Calculation (min) 47 min    Equipment Utilized During Treatment Gait belt    Activity Tolerance Patient tolerated treatment well    Behavior During Therapy WFL for tasks assessed/performed             Past Medical History:  Diagnosis Date   Arrhythmia    atrial fibrillation   Asthma    CHF (congestive heart failure) (HCC)    COPD (chronic obstructive pulmonary disease) (HCC)    Coronary artery disease    Depression    Diabetes mellitus without complication (HCC)    Gout    History anabolic steroid use    Hyperlipidemia    Hypertension    Hypogonadism in male    MI (myocardial infarction) (HCC)    Morbid obesity (HCC)    Myocardial infarction (HCC)    Peripheral vascular disease (HCC)    Perirectal abscess    Pleurisy    Sleep apnea    CPAP at night, no oxygen   Varicella    Past Surgical History:  Procedure Laterality Date   ABDOMINAL AORTIC ANEURYSM REPAIR     ACHILLES TENDON SURGERY Left 01/10/2021   Procedure: ACHILLES LENGTHENING/KIDNER;  Surgeon: Rosetta Posner, DPM;  Location: [facility];  Service: Podiatry;  Laterality: Left;   AMPUTATION Left 10/03/2021   Procedure: AMPUTATION BELOW KNEE;  Surgeon: Renford Dills, MD;  Location: [facility];  Service: Vascular;  Laterality: Left;   AMPUTATION Right 05/24/2022   Procedure: AMPUTATION BELOW KNEE;  Surgeon: Annice Needy, MD;  Location: [facility];  Service: General;  Laterality: Right;   AMPUTATION TOE Right 02/10/2016   Procedure: AMPUTATION TOE 3RD TOE;  Surgeon: Gwyneth Revels, DPM;  Location: [facility];  Service: Podiatry;   Laterality: Right;   AMPUTATION TOE Left 02/24/2020   Procedure: AMPUTATION TOE;  Surgeon: Rosetta Posner, DPM;  Location: [facility];  Service: Podiatry;  Laterality: Left;   APPLICATION OF WOUND VAC Left 02/29/2020   Procedure: APPLICATION OF WOUND VAC;  Surgeon: Rosetta Posner, DPM;  Location: [facility];  Service: Podiatry;  Laterality: Left;   CARDIAC CATHETERIZATION     CARDIOVERSION [name] 02/13/2022   Procedure: CARDIOVERSION;  Surgeon: Lamar Blinks, MD;  Location: [facility];  Service: Cardiovascular;  Laterality: [name];   COLONOSCOPY WITH PROPOFOL [name] 11/18/2015   Procedure: COLONOSCOPY WITH PROPOFOL;  Surgeon: Scot Jun, MD;  Location: [facility];  Service: Endoscopy;  Laterality: [name];   CORONARY ARTERY BYPASS GRAFT     CORONARY STENT INTERVENTION [name] 02/02/2020   Procedure: CORONARY STENT INTERVENTION;  Surgeon: Marcina Millard, MD;  Location: [facility];  Service: Cardiovascular;  Laterality: [name];   INCISION AND DRAINAGE Left 08/07/2021   Procedure: INCISION AND DRAINAGE-Partial Calcanectomy;  Surgeon: Rosetta Posner, DPM;  Location: [facility];  Service: Podiatry;  Laterality: Left;   IRRIGATION AND DEBRIDEMENT FOOT Left 02/29/2020   Procedure: IRRIGATION AND DEBRIDEMENT FOOT;  Surgeon: Rosetta Posner, DPM;  Location: [facility];  Service: Podiatry;  Laterality: Left;  IRRIGATION AND DEBRIDEMENT FOOT Left 02/24/2020   Procedure: IRRIGATION AND DEBRIDEMENT FOOT;  Surgeon: Rosetta Posner, DPM;  Location: [facility];  Service: Podiatry;  Laterality: Left;   KNEE ARTHROSCOPY     LEFT HEART CATH AND CORS/GRAFTS ANGIOGRAPHY [name] 02/02/2020   Procedure: LEFT HEART CATH AND CORS/GRAFTS ANGIOGRAPHY;  Surgeon: Dalia Heading, MD;  Location: [facility];  Service: Cardiovascular;  Laterality: [name];   LOWER EXTREMITY ANGIOGRAPHY Left 02/25/2020   Procedure: Lower Extremity Angiography;  Surgeon: Annice Needy, MD;  Location: [facility];  Service: Cardiovascular;   Laterality: Left;   LOWER EXTREMITY ANGIOGRAPHY Left 01/04/2021   Procedure: LOWER EXTREMITY ANGIOGRAPHY;  Surgeon: Annice Needy, MD;  Location: [facility];  Service: Cardiovascular;  Laterality: Left;   LOWER EXTREMITY ANGIOGRAPHY Right 05/21/2022   Procedure: Lower Extremity Angiography;  Surgeon: Annice Needy, MD;  Location: [facility];  Service: Cardiovascular;  Laterality: Right;   METATARSAL HEAD EXCISION Left 01/10/2021   Procedure: METATARSAL HEAD EXCISION - LEFT 5th;  Surgeon: Rosetta Posner, DPM;  Location: [facility];  Service: Podiatry;  Laterality: Left;   PERIPHERAL VASCULAR CATHETERIZATION Right 01/24/2016   Procedure: Lower Extremity Angiography;  Surgeon: Renford Dills, MD;  Location: [facility];  Service: Cardiovascular;  Laterality: Right;   PERIPHERAL VASCULAR CATHETERIZATION Right 01/25/2016   Procedure: Lower Extremity Angiography;  Surgeon: Renford Dills, MD;  Location: [facility];  Service: Cardiovascular;  Laterality: Right;   TOE AMPUTATION     TONSILLECTOMY     Patient Active Problem List   Diagnosis Date Noted   Status post below-knee amputation of right lower extremity (HCC) 05/31/2022   S/P amputation 05/30/2022   Poorly controlled diabetes mellitus (HCC) 05/30/2022   Acute osteomyelitis of right foot (HCC) 05/19/2022   Wound infection 05/18/2022   Ulcer of right ankle, with fat layer exposed (HCC) 05/18/2022   Non-healing wound of lower extremity, initial encounter 05/18/2022   CAD with Hx of CABG 05/18/2022   Chronic respiratory failure with hypoxia (HCC) 05/18/2022   S/P BKA (below knee amputation), left (HCC) 05/18/2022   COPD (chronic obstructive pulmonary disease) (HCC) 05/18/2022   Perinephric hematoma 03/20/2022   Bacteremia due to Klebsiella pneumoniae 03/19/2022   Hyperglycemia 03/16/2022   Septic shock (HCC) 03/16/2022   Sepsis secondary to UTI (HCC) 03/15/2022   Acute hypoxemic respiratory failure  (HCC) 02/13/2022   Atrial fibrillation with rapid ventricular response (HCC) 02/11/2022   Uncontrolled type 2 diabetes mellitus with hyperglycemia, with long-term current use of insulin (HCC) 02/11/2022   Dyslipidemia 02/11/2022   Hypothyroidism 02/11/2022   Benign prostatic hyperplasia with lower urinary tract symptoms 02/11/2022   Pressure injury of skin of heel 11/04/2021   UTI (urinary tract infection) 10/22/2021   Iron deficiency anemia 10/19/2021   Hyperkalemia 10/14/2021   Acute urinary retention 10/08/2021   Sepsis due to methicillin resistant Staphylococcus aureus (MRSA) (HCC) 10/04/2021   Acute delirium 10/04/2021   Acute on chronic respiratory failure with hypoxia and hypercapnia (HCC) 10/04/2021   Acute respiratory failure with hypoxia and hypercapnia (HCC) 10/03/2021   OSA (obstructive sleep apnea) 10/03/2021   Malnutrition of moderate degree 09/29/2021   Generalized weakness 09/29/2021   Cellulitis of left foot 08/04/2021   Uncontrolled type 2 diabetes mellitus with hypoglycemia, with long-term current use of insulin (HCC) 08/04/2021   Bilateral lower extremity edema 08/04/2021   Acute osteomyelitis of lower leg, left (HCC) 08/04/2021   Swelling of  limb 07/18/2021   CHF exacerbation (HCC) 05/08/2021   Diabetes mellitus without complication (HCC)    Atrial fibrillation with RVR (HCC)    Obesity, Class III, BMI 40-49.9 (morbid obesity) (HCC)    Venous stasis ulcer (HCC)    Sinus tachycardia    BPH (benign prostatic hyperplasia)    Chronic anticoagulation    Anasarca    Chronic diastolic CHF (congestive heart failure) (HCC)    Venous stasis of both lower extremities    AF (paroxysmal atrial fibrillation) (HCC)    Lower extremity cellulitis 01/03/2021   Chronic osteomyelitis of left foot (HCC) 01/03/2021   Osteomyelitis (HCC) 01/03/2021   Severe sepsis (HCC) 02/24/2020   AKI (acute kidney injury) (HCC) 02/24/2020   Bilateral cellulitis of lower leg 02/23/2020   CAD  (coronary artery disease) 02/02/2020   RLS (restless legs syndrome) 05/15/2019   Statin myopathy 05/15/2019   Diabetes mellitus type 2 in obese 02/13/2016   Essential hypertension 02/13/2016   PAD (peripheral artery disease) (HCC) 02/13/2016   Pressure injury of skin 01/25/2016   Hypoglycemia associated with diabetes (HCC) 12/27/2013   Long-term insulin use (HCC) 12/27/2013   Microalbuminuria 12/27/2013   B12 deficiency 09/29/2013   Obesity 09/29/2013   CAD (coronary artery disease), autologous vein bypass graft 07/20/2013   Hypersomnia with sleep apnea 07/20/2013   Pure hypercholesterolemia 07/20/2013   Osteomyelitis of ankle or foot 07/20/2011    PCP: Dr. Aram Beecham  REFERRING PROVIDER: Sheppard Plumber, NP  REFERRING DIAG: [phone] (ICD-10-CM) - Status post below-knee amputation of right lower extremity (HCC)   THERAPY DIAG:  Difficulty in walking, not elsewhere classified  Muscle weakness (generalized)  Other abnormalities of gait and mobility  Other lack of coordination  Abnormality of gait and mobility  Unsteadiness on feet  Rationale for Evaluation and Treatment: Rehabilitation  ONSET DATE: 05/24/2022 (date of surgery for latest Below knee amputation- RLE)   SUBJECTIVE:   SUBJECTIVE STATEMENT: Patient reports he is eager to be here and a long road to recovery- Reports having his left leg amputated last year (10/03/2021) and most recently his right LE - both below the knee amputations. Reports very dependent with all mobility and states ready to get stronger and on his feet. States just received his prosthesis yesterday.   PERTINENT HISTORY: Patient is a 67 year old male with CAD/PAF on Eliquis followed by Cardiology.. Obese with BMI>38. HTN stable on meds. Has HLD not on statin due to myopathy. Also with DM on insulin and thyroid dz on Synthroid. (See above section for all PMH)   Patient using [facility] for prosthetic care PAIN:  Are you having pain?  No  PRECAUTIONS: Fall  RED FLAGS: None   WEIGHT BEARING RESTRICTIONS: No  FALLS:  Has patient fallen in last 6 months?  Will ask next visit  LIVING ENVIRONMENT: Lives with: lives with their spouse Lives in: House/apartment Stairs:  split level home - 2 step down to den- ramp for entry in home.  Has following equipment at home: Chiropractor, Environmental consultant - 2 wheeled, Wheelchair (manual), [employer], Ramped entry, and [employer], hoyer lift  OCCUPATION: Psychologist, occupational- retired  PLOF: Independent- prior to 10/03/2021 (left BKA)   PATIENT GOALS: "I want to walk with my prostheses"  NEXT MD VISIT: TBA  OBJECTIVE:   DIAGNOSTIC FINDINGS  PATIENT SURVEYS:  FOTO 71 with goal of 60  COGNITION: Overall cognitive status: Within functional limits for tasks assessed     SENSATION: WFL  EDEMA:  None  observed    POSTURE:  slouched in w/c  and prosthesis hanging off leg rest   LOWER EXTREMITY MMT:  MMT Right eval Left eval  Hip flexion 3+ 4  Hip extension 3+ 4  Hip abduction 3+ 4  Hip adduction 4 4  Hip internal rotation    Hip external rotation    Knee flexion 3+ 3+  Knee extension 3+ 3+  Ankle dorsiflexion    Ankle plantarflexion    Ankle inversion    Ankle eversion     (Blank rows = not tested)   FUNCTIONAL TESTS:  Not appropriate as patient unable to stand - will add once appropriate  GAIT: Patient unable at this time.  TODAY'S TREATMENT:                                                                                                                              DATE: 11/28/22   PT Evaluation    PATIENT EDUCATION:  Education details: Purpose of skilled PT to instruct in prosthetic training Person educated: Patient and Spouse Education method: Explanation Education comprehension: verbalized understanding  HOME EXERCISE PROGRAM: To be initiated next 1-2 visits  ASSESSMENT:  CLINICAL IMPRESSION: Patient is a 67 y.o. male who was seen today for  physical therapy evaluation and treatment for Bilateral LE below the knee amputation.  He presents today with his wife and states very limited with all mobility- using hoyer for transfers from w/c to bed and using slide board from bed to chair. Just received his prosthesis and has not been able to stand or practice standing. He presents with Bilateral LE muscle weakness and difficulty with all mobility using manual w/c and currently unable to stand or walk. Attempted slide board transfer from w/c to slightly raised mat yet patient unsuccessful today. Will continue assessment next visit including bed mobility, transfers, don/doff braces and set more goals next visit. He will benefit from skilled PT services to improve his overall LE strength, transfer ability, and possibly able to return to walking with prosthetic training for improved mobility and quality of life.    OBJECTIVE IMPAIRMENTS: decreased activity tolerance, decreased balance, decreased endurance, difficulty walking, decreased strength, and obesity.   ACTIVITY LIMITATIONS: carrying, lifting, bending, standing, squatting, stairs, transfers, bed mobility, bathing, toileting, and dressing  PARTICIPATION LIMITATIONS: cleaning, driving, shopping, community activity, occupation, and yard work  PERSONAL FACTORS: Time since onset of injury/illness/exacerbation and 3+ comorbidities: DM, COPD, PAD  are also affecting patient's functional outcome.   REHAB POTENTIAL: Good  CLINICAL DECISION MAKING: Evolving/moderate complexity  EVALUATION COMPLEXITY: Moderate   GOALS: Goals reviewed with patient? Yes  SHORT TERM GOALS: Target date: 01/09/2023  LONG TERM GOALS: Target date: 02/20/2023   1.  Patient will demonstrate modified independent with all bed mobility (sup<-> sit)  for optimal function at home  Baseline: EVAL - Will assess 2nd visit Goal status: INITIAL  2.  Patient will increase FOTO score to equal to or  greater than  52 to  demonstrate statistically significant improvement in mobility and quality of life.  Baseline: EVAL = 41 Goal status: INITIAL   3.  Patient will demonstrate all slide board transfers with Min assist (Bed <-> chair)   Baseline: EVAL= Patient unable to transfer from chair to bed due to increased musle weakness Goal status: INITIAL       PLAN:  PT FREQUENCY: 1-2x/week  PT DURATION: 12 weeks  PLANNED INTERVENTIONS: Therapeutic exercises, Therapeutic activity, Neuromuscular re-education, Balance training, Gait training, Patient/Family education, Self Care, Joint mobilization, Joint manipulation, Vestibular training, Canalith repositioning, Prosthetic training, DME instructions, Dry Needling, Electrical stimulation, Wheelchair mobility training, Spinal manipulation, Spinal mobilization, Cryotherapy, Moist heat, Compression bandaging, scar mobilization, Manual therapy, and Re-evaluation  PLAN FOR NEXT SESSION: Assess bed mobility, transfers using slide board, Assess ability to don/doff prosthesis, Standing in // bars as appropriate. Initiate HEP next visit.    Lenda Kelp, PT 11/28/2022, 5:00 PM

## 2022-12-04 ENCOUNTER — Ambulatory Visit: Payer: Medicare Other | Attending: Nurse Practitioner

## 2022-12-04 DIAGNOSIS — M6281 Muscle weakness (generalized): Secondary | ICD-10-CM | POA: Insufficient documentation

## 2022-12-04 DIAGNOSIS — R278 Other lack of coordination: Secondary | ICD-10-CM | POA: Insufficient documentation

## 2022-12-04 DIAGNOSIS — R2681 Unsteadiness on feet: Secondary | ICD-10-CM | POA: Insufficient documentation

## 2022-12-04 DIAGNOSIS — R2689 Other abnormalities of gait and mobility: Secondary | ICD-10-CM | POA: Insufficient documentation

## 2022-12-04 DIAGNOSIS — R269 Unspecified abnormalities of gait and mobility: Secondary | ICD-10-CM | POA: Insufficient documentation

## 2022-12-04 DIAGNOSIS — R262 Difficulty in walking, not elsewhere classified: Secondary | ICD-10-CM | POA: Insufficient documentation

## 2022-12-04 NOTE — Therapy (Signed)
OUTPATIENT PHYSICAL THERAPY TREATMENT   Patient Name: [name]. MRN: [id number] DOB:[birth year], 67 y.o., male Today's Date: 12/04/2022  END OF SESSION:  PT End of Session - 12/04/22 1519     Visit Number 2    Number of Visits 24    Date for PT Re-Evaluation 02/20/23    Authorization Type [employer] Medicare    Progress Note Due on Visit 10    PT Start Time 1402    PT Stop Time 1505    PT Time Calculation (min) 63 min    Activity Tolerance Patient tolerated treatment well;No increased pain;Patient limited by fatigue             Past Medical History:  Diagnosis Date   Arrhythmia    atrial fibrillation   Asthma    CHF (congestive heart failure) (HCC)    COPD (chronic obstructive pulmonary disease) (HCC)    Coronary artery disease    Depression    Diabetes mellitus without complication (HCC)    Gout    History anabolic steroid use    Hyperlipidemia    Hypertension    Hypogonadism in male    MI (myocardial infarction) (HCC)    Morbid obesity (HCC)    Myocardial infarction (HCC)    Peripheral vascular disease (HCC)    Perirectal abscess    Pleurisy    Sleep apnea    CPAP at night, no oxygen   Varicella    Past Surgical History:  Procedure Laterality Date   ABDOMINAL AORTIC ANEURYSM REPAIR     ACHILLES TENDON SURGERY Left 01/10/2021   Procedure: ACHILLES LENGTHENING/KIDNER;  Surgeon: Rosetta Posner, DPM;  Location: [facility];  Service: Podiatry;  Laterality: Left;   AMPUTATION Left 10/03/2021   Procedure: AMPUTATION BELOW KNEE;  Surgeon: Renford Dills, MD;  Location: [facility];  Service: Vascular;  Laterality: Left;   AMPUTATION Right 05/24/2022   Procedure: AMPUTATION BELOW KNEE;  Surgeon: Annice Needy, MD;  Location: [facility];  Service: General;  Laterality: Right;   AMPUTATION TOE Right 02/10/2016   Procedure: AMPUTATION TOE 3RD TOE;  Surgeon: Gwyneth Revels, DPM;  Location: [facility];  Service: Podiatry;  Laterality: Right;   AMPUTATION TOE Left  02/24/2020   Procedure: AMPUTATION TOE;  Surgeon: Rosetta Posner, DPM;  Location: [facility];  Service: Podiatry;  Laterality: Left;   APPLICATION OF WOUND VAC Left 02/29/2020   Procedure: APPLICATION OF WOUND VAC;  Surgeon: Rosetta Posner, DPM;  Location: [facility];  Service: Podiatry;  Laterality: Left;   CARDIAC CATHETERIZATION     CARDIOVERSION [name] 02/13/2022   Procedure: CARDIOVERSION;  Surgeon: Lamar Blinks, MD;  Location: [facility];  Service: Cardiovascular;  Laterality: [name];   COLONOSCOPY WITH PROPOFOL [name] 11/18/2015   Procedure: COLONOSCOPY WITH PROPOFOL;  Surgeon: Scot Jun, MD;  Location: [facility];  Service: Endoscopy;  Laterality: [name];   CORONARY ARTERY BYPASS GRAFT     CORONARY STENT INTERVENTION [name] 02/02/2020   Procedure: CORONARY STENT INTERVENTION;  Surgeon: Marcina Millard, MD;  Location: [facility];  Service: Cardiovascular;  Laterality: [name];   INCISION AND DRAINAGE Left 08/07/2021   Procedure: INCISION AND DRAINAGE-Partial Calcanectomy;  Surgeon: Rosetta Posner, DPM;  Location: [facility];  Service: Podiatry;  Laterality: Left;   IRRIGATION AND DEBRIDEMENT FOOT Left 02/29/2020   Procedure: IRRIGATION AND DEBRIDEMENT FOOT;  Surgeon: Rosetta Posner, DPM;  Location: [facility];  Service: Podiatry;  Laterality: Left;   IRRIGATION AND DEBRIDEMENT FOOT Left 02/24/2020   Procedure:  IRRIGATION AND DEBRIDEMENT FOOT;  Surgeon: Rosetta Posner, DPM;  Location: [facility];  Service: Podiatry;  Laterality: Left;   KNEE ARTHROSCOPY     LEFT HEART CATH AND CORS/GRAFTS ANGIOGRAPHY [name] 02/02/2020   Procedure: LEFT HEART CATH AND CORS/GRAFTS ANGIOGRAPHY;  Surgeon: Dalia Heading, MD;  Location: [facility];  Service: Cardiovascular;  Laterality: [name];   LOWER EXTREMITY ANGIOGRAPHY Left 02/25/2020   Procedure: Lower Extremity Angiography;  Surgeon: Annice Needy, MD;  Location: [facility];  Service: Cardiovascular;  Laterality: Left;   LOWER EXTREMITY  ANGIOGRAPHY Left 01/04/2021   Procedure: LOWER EXTREMITY ANGIOGRAPHY;  Surgeon: Annice Needy, MD;  Location: [facility];  Service: Cardiovascular;  Laterality: Left;   LOWER EXTREMITY ANGIOGRAPHY Right 05/21/2022   Procedure: Lower Extremity Angiography;  Surgeon: Annice Needy, MD;  Location: [facility];  Service: Cardiovascular;  Laterality: Right;   METATARSAL HEAD EXCISION Left 01/10/2021   Procedure: METATARSAL HEAD EXCISION - LEFT 5th;  Surgeon: Rosetta Posner, DPM;  Location: [facility];  Service: Podiatry;  Laterality: Left;   PERIPHERAL VASCULAR CATHETERIZATION Right 01/24/2016   Procedure: Lower Extremity Angiography;  Surgeon: Renford Dills, MD;  Location: [facility];  Service: Cardiovascular;  Laterality: Right;   PERIPHERAL VASCULAR CATHETERIZATION Right 01/25/2016   Procedure: Lower Extremity Angiography;  Surgeon: Renford Dills, MD;  Location: [facility];  Service: Cardiovascular;  Laterality: Right;   TOE AMPUTATION     TONSILLECTOMY     Patient Active Problem List   Diagnosis Date Noted   Status post below-knee amputation of right lower extremity (HCC) 05/31/2022   S/P amputation 05/30/2022   Poorly controlled diabetes mellitus (HCC) 05/30/2022   Acute osteomyelitis of right foot (HCC) 05/19/2022   Wound infection 05/18/2022   Ulcer of right ankle, with fat layer exposed (HCC) 05/18/2022   Non-healing wound of lower extremity, initial encounter 05/18/2022   CAD with Hx of CABG 05/18/2022   Chronic respiratory failure with hypoxia (HCC) 05/18/2022   S/P BKA (below knee amputation), left (HCC) 05/18/2022   COPD (chronic obstructive pulmonary disease) (HCC) 05/18/2022   Perinephric hematoma 03/20/2022   Bacteremia due to Klebsiella pneumoniae 03/19/2022   Hyperglycemia 03/16/2022   Septic shock (HCC) 03/16/2022   Sepsis secondary to UTI (HCC) 03/15/2022   Acute hypoxemic respiratory failure (HCC) 02/13/2022   Atrial fibrillation  with rapid ventricular response (HCC) 02/11/2022   Uncontrolled type 2 diabetes mellitus with hyperglycemia, with long-term current use of insulin (HCC) 02/11/2022   Dyslipidemia 02/11/2022   Hypothyroidism 02/11/2022   Benign prostatic hyperplasia with lower urinary tract symptoms 02/11/2022   Pressure injury of skin of heel 11/04/2021   UTI (urinary tract infection) 10/22/2021   Iron deficiency anemia 10/19/2021   Hyperkalemia 10/14/2021   Acute urinary retention 10/08/2021   Sepsis due to methicillin resistant Staphylococcus aureus (MRSA) (HCC) 10/04/2021   Acute delirium 10/04/2021   Acute on chronic respiratory failure with hypoxia and hypercapnia (HCC) 10/04/2021   Acute respiratory failure with hypoxia and hypercapnia (HCC) 10/03/2021   OSA (obstructive sleep apnea) 10/03/2021   Malnutrition of moderate degree 09/29/2021   Generalized weakness 09/29/2021   Cellulitis of left foot 08/04/2021   Uncontrolled type 2 diabetes mellitus with hypoglycemia, with long-term current use of insulin (HCC) 08/04/2021   Bilateral lower extremity edema 08/04/2021   Acute osteomyelitis of lower leg, left (HCC) 08/04/2021   Swelling of limb 07/18/2021   CHF exacerbation (HCC) 05/08/2021  Diabetes mellitus without complication (HCC)    Atrial fibrillation with RVR (HCC)    Obesity, Class III, BMI 40-49.9 (morbid obesity) (HCC)    Venous stasis ulcer (HCC)    Sinus tachycardia    BPH (benign prostatic hyperplasia)    Chronic anticoagulation    Anasarca    Chronic diastolic CHF (congestive heart failure) (HCC)    Venous stasis of both lower extremities    AF (paroxysmal atrial fibrillation) (HCC)    Lower extremity cellulitis 01/03/2021   Chronic osteomyelitis of left foot (HCC) 01/03/2021   Osteomyelitis (HCC) 01/03/2021   Severe sepsis (HCC) 02/24/2020   AKI (acute kidney injury) (HCC) 02/24/2020   Bilateral cellulitis of lower leg 02/23/2020   CAD (coronary artery disease) 02/02/2020    RLS (restless legs syndrome) 05/15/2019   Statin myopathy 05/15/2019   Diabetes mellitus type 2 in obese 02/13/2016   Essential hypertension 02/13/2016   PAD (peripheral artery disease) (HCC) 02/13/2016   Pressure injury of skin 01/25/2016   Hypoglycemia associated with diabetes (HCC) 12/27/2013   Long-term insulin use (HCC) 12/27/2013   Microalbuminuria 12/27/2013   B12 deficiency 09/29/2013   Obesity 09/29/2013   CAD (coronary artery disease), autologous vein bypass graft 07/20/2013   Hypersomnia with sleep apnea 07/20/2013   Pure hypercholesterolemia 07/20/2013   Osteomyelitis of ankle or foot 07/20/2011    PCP: Dr. Aram Beecham  REFERRING PROVIDER: Sheppard Plumber, NP  REFERRING DIAG: [phone] (ICD-10-CM) - Status post below-knee amputation of right lower extremity (HCC)   THERAPY DIAG:  Difficulty in walking, not elsewhere classified  Muscle weakness (generalized)  Other abnormalities of gait and mobility  Rationale for Evaluation and Treatment: Rehabilitation  ONSET DATE: 05/24/2022 (date of surgery for latest Below knee amputation- RLE)   SUBJECTIVE:   SUBJECTIVE STATEMENT: Pt returns to clinic without significant complaint or report. Prostheses are donned upon arrival with pins are not engaged with locks despite attempts with wife assisting.   PERTINENT HISTORY: Patient is a 67 year old male with CAD/PAF on Eliquis followed by Cardiology.. Obese with BMI>38. HTN stable on meds. Has HLD not on statin due to myopathy. Also with DM on insulin and thyroid dz on Synthroid. (See above section for all PMH) Patient reports he is eager to be here and a long road to recovery- Reports having his left leg amputated last year (10/03/2021) and most recently his right LE - both below the knee amputations. Reports very dependent with all mobility and states ready to get stronger and on his feet. States just received his prosthesis yesterday.   Patient using [employer] for prosthetic  care PAIN:  Are you having pain? No  PRECAUTIONS: Fall  RED FLAGS: None   WEIGHT BEARING RESTRICTIONS: No  FALLS:  Has patient fallen in last 6 months?  Will ask next visit  LIVING ENVIRONMENT: Lives with: lives with their spouse Lives in: House/apartment Stairs:  split level home - 2 step down to den- ramp for entry in home.  Has following equipment at home: Chiropractor, Environmental consultant - 2 wheeled, Wheelchair (manual), [employer], Ramped entry, and [employer], hoyer lift  OCCUPATION: Psychologist, occupational- retired  PLOF: Independent- prior to 10/03/2021 (left BKA)   PATIENT GOALS: "I want to walk with my prostheses"  NEXT MD VISIT: TBA  OBJECTIVE:   TODAY'S TREATMENT:  DATE: 12/04/22   -slide board transfer WC to blue wide table (uphill, slide board transfer) minA for placement under tuberosity, minA of bilat feet reposition  -seated RDL x15 (depth to comfort, but standardized)  -seated marching x20, hands free for core engagement -seated RDL x15 (depth to comfort, but standardized) c7lb weight -seated marching x20, hands free for core engagement -seated RDL x15 (depth to comfort, but standardized) c15lb kettlebell  -Rt LAQ 1x15 (no weight, knee pain with weight); LLE 1x15 @ 2.5lb (does not look optimal in retrospect given short length of socket pin, lots of accessory movement in open chain)  -Lateral scoot transfer training WITH prostheses bilat x10 minutes Rt and left: tolerance limited to ~25ft per bout, limited by fatigue; extensive cues and problem solving used to improve funcitonal weight bearing use of bilat prostheses and improve anterior trunk weight shift. -slide board pivot transfer from plinth to [facility]  PATIENT EDUCATION:  *extensive education on benefits of being in a higher sitting positon in [facility] for exercises, knee/back/hip comfort, donning/doffing  prostheses, transfers.   *extensive problem solving for techiques to improve independence with self donning prosthese legs with minimal physical burden on caregiver   HOME EXERCISE PROGRAM: -determination of a schedule at home for wearing of prosthese and shrinkers; recommended on 3x daily for prostheses with ad lib increase in time. -discussed pros and cons of shrink use/disuse overnight -torubleshooting and practicing self donning of pin in lock of legs   ASSESSMENT:  CLINICAL IMPRESSION: Extensive education this date on use of prostheses in the home, homework assignment to determine an achievable wear schedule for both prostheses as well as shrinkers, homework also to troubleshoot ways to independently don sockets without caregiver assistance. Spent time in session working on modified transfers techniques with prostheses donned, limited largely by falls anxiety and equal degree of weakness and lack of practice. Pt shows good potential for hip extension strength training in seated positions, but transition to home performance is limited significantly by WC height too short. Pt quite focused and motivated throughout session, puts forth great effort. No c/o of loading intolerance with residual limb in socket. He will benefit from skilled PT services to improve his overall LE strength, transfer ability, and possibly able to return to walking with prosthetic training for improved mobility and quality of life.    OBJECTIVE IMPAIRMENTS: decreased activity tolerance, decreased balance, decreased endurance, difficulty walking, decreased strength, and obesity.   ACTIVITY LIMITATIONS: carrying, lifting, bending, standing, squatting, stairs, transfers, bed mobility, bathing, toileting, and dressing  PARTICIPATION LIMITATIONS: cleaning, driving, shopping, community activity, occupation, and yard work  PERSONAL FACTORS: Time since onset of injury/illness/exacerbation and 3+ comorbidities: DM, COPD,  PAD  are also affecting patient's functional outcome.   REHAB POTENTIAL: Good  CLINICAL DECISION MAKING: Evolving/moderate complexity  EVALUATION COMPLEXITY: Moderate   GOALS: Goals reviewed with patient? Yes  SHORT TERM GOALS: Target date: 01/09/2023  LONG TERM GOALS: Target date: 02/20/2023   1.  Patient will demonstrate modified independent with all bed mobility (sup<-> sit)  for optimal function at home  Baseline: EVAL - Will assess 2nd visit Goal status: INITIAL  2.  Patient will increase FOTO score to equal to or greater than  52 to demonstrate statistically significant improvement in mobility and quality of life.  Baseline: EVAL = 41 Goal status: INITIAL   3.  Patient will demonstrate all slide board transfers with Min assist (Bed <-> chair)   Baseline: EVAL= Patient unable to transfer from chair to  bed due to increased musle weakness Goal status: INITIAL       PLAN:  PT FREQUENCY: 1-2x/week  PT DURATION: 12 weeks  PLANNED INTERVENTIONS: Therapeutic exercises, Therapeutic activity, Neuromuscular re-education, Balance training, Gait training, Patient/Family education, Self Care, Joint mobilization, Joint manipulation, Vestibular training, Canalith repositioning, Prosthetic training, DME instructions, Dry Needling, Electrical stimulation, Wheelchair mobility training, Spinal manipulation, Spinal mobilization, Cryotherapy, Moist heat, Compression bandaging, scar mobilization, Manual therapy, and Re-evaluation  PLAN FOR NEXT SESSION: FU on homework for troubleshooting self donning prosthesis and establishing a schedule of wear for srinkers, continue to advance seated weightbearing in prostheses and confidence with anterior weight shift in shofrt sitting, FU on use of WC cushions to establish increased seat height.   3:40 PM, 12/04/22 Rosamaria Lints, PT, DPT Physical Therapist - [facility]  Outpatient Physical Therapy- Main  Campus [phone]     Bucklin C, PT 12/04/2022, 3:22 PM

## 2022-12-10 ENCOUNTER — Ambulatory Visit: Payer: Medicare Other

## 2022-12-10 DIAGNOSIS — R269 Unspecified abnormalities of gait and mobility: Secondary | ICD-10-CM

## 2022-12-10 DIAGNOSIS — M6281 Muscle weakness (generalized): Secondary | ICD-10-CM

## 2022-12-10 DIAGNOSIS — R262 Difficulty in walking, not elsewhere classified: Secondary | ICD-10-CM

## 2022-12-10 DIAGNOSIS — R2689 Other abnormalities of gait and mobility: Secondary | ICD-10-CM

## 2022-12-10 DIAGNOSIS — R2681 Unsteadiness on feet: Secondary | ICD-10-CM

## 2022-12-10 DIAGNOSIS — R278 Other lack of coordination: Secondary | ICD-10-CM

## 2022-12-10 NOTE — Therapy (Signed)
OUTPATIENT PHYSICAL THERAPY TREATMENT   Patient Name: [name]. MRN: [id number] DOB:Jan 10, 1956, 67 y.o., male Today's Date: 12/11/2022  END OF SESSION:  PT End of Session - 12/10/22 1454     Visit Number 3    Number of Visits 24    Date for PT Re-Evaluation 02/20/23    Authorization Type [employer]BS Medicare    Progress Note Due on Visit 10    PT Start Time 1449    PT Stop Time 1530    PT Time Calculation (min) 41 min    Equipment Utilized During Treatment Gait belt    Activity Tolerance Patient tolerated treatment well;No increased pain;Patient limited by fatigue    Behavior During Therapy [facility] for tasks assessed/performed              Past Medical History:  Diagnosis Date   Arrhythmia    atrial fibrillation   Asthma    CHF (congestive heart failure) (HCC)    COPD (chronic obstructive pulmonary disease) (HCC)    Coronary artery disease    Depression    Diabetes mellitus without complication (HCC)    Gout    History anabolic steroid use    Hyperlipidemia    Hypertension    Hypogonadism in male    MI (myocardial infarction) (HCC)    Morbid obesity (HCC)    Myocardial infarction (HCC)    Peripheral vascular disease (HCC)    Perirectal abscess    Pleurisy    Sleep apnea    CPAP at night, no oxygen   Varicella    Past Surgical History:  Procedure Laterality Date   ABDOMINAL AORTIC ANEURYSM REPAIR     ACHILLES TENDON SURGERY Left 01/10/2021   Procedure: ACHILLES LENGTHENING/KIDNER;  Surgeon: Rosetta Posner, DPM;  Location: [facility];  Service: Podiatry;  Laterality: Left;   AMPUTATION Left 10/03/2021   Procedure: AMPUTATION BELOW KNEE;  Surgeon: Renford Dills, MD;  Location: [facility];  Service: Vascular;  Laterality: Left;   AMPUTATION Right 05/24/2022   Procedure: AMPUTATION BELOW KNEE;  Surgeon: Annice Needy, MD;  Location: [facility];  Service: General;  Laterality: Right;   AMPUTATION TOE Right 02/10/2016   Procedure: AMPUTATION TOE 3RD TOE;   Surgeon: Gwyneth Revels, DPM;  Location: [facility];  Service: Podiatry;  Laterality: Right;   AMPUTATION TOE Left 02/24/2020   Procedure: AMPUTATION TOE;  Surgeon: Rosetta Posner, DPM;  Location: [facility];  Service: Podiatry;  Laterality: Left;   APPLICATION OF WOUND VAC Left 02/29/2020   Procedure: APPLICATION OF WOUND VAC;  Surgeon: Rosetta Posner, DPM;  Location: [facility];  Service: Podiatry;  Laterality: Left;   CARDIAC CATHETERIZATION     CARDIOVERSION [name] 02/13/2022   Procedure: CARDIOVERSION;  Surgeon: Lamar Blinks, MD;  Location: [facility];  Service: Cardiovascular;  Laterality: [name];   COLONOSCOPY WITH PROPOFOL [name] 11/18/2015   Procedure: COLONOSCOPY WITH PROPOFOL;  Surgeon: Scot Jun, MD;  Location: [facility];  Service: Endoscopy;  Laterality: [name];   CORONARY ARTERY BYPASS GRAFT     CORONARY STENT INTERVENTION [name] 02/02/2020   Procedure: CORONARY STENT INTERVENTION;  Surgeon: Marcina Millard, MD;  Location: [facility];  Service: Cardiovascular;  Laterality: [name];   INCISION AND DRAINAGE Left 08/07/2021   Procedure: INCISION AND DRAINAGE-Partial Calcanectomy;  Surgeon: Rosetta Posner, DPM;  Location: [facility];  Service: Podiatry;  Laterality: Left;   IRRIGATION AND DEBRIDEMENT FOOT Left 02/29/2020   Procedure: IRRIGATION AND DEBRIDEMENT FOOT;  Surgeon: Rosetta Posner, DPM;  Location: [facility];  Service: Podiatry;  Laterality: Left;   IRRIGATION AND DEBRIDEMENT FOOT Left 02/24/2020   Procedure: IRRIGATION AND DEBRIDEMENT FOOT;  Surgeon: Rosetta Posner, DPM;  Location: [facility];  Service: Podiatry;  Laterality: Left;   KNEE ARTHROSCOPY     LEFT HEART CATH AND CORS/GRAFTS ANGIOGRAPHY [name] 02/02/2020   Procedure: LEFT HEART CATH AND CORS/GRAFTS ANGIOGRAPHY;  Surgeon: Dalia Heading, MD;  Location: [facility];  Service: Cardiovascular;  Laterality: [name];   LOWER EXTREMITY ANGIOGRAPHY Left 02/25/2020   Procedure: Lower Extremity Angiography;  Surgeon: Annice Needy, MD;  Location: [facility];  Service: Cardiovascular;  Laterality: Left;   LOWER EXTREMITY ANGIOGRAPHY Left 01/04/2021   Procedure: LOWER EXTREMITY ANGIOGRAPHY;  Surgeon: Annice Needy, MD;  Location: [facility];  Service: Cardiovascular;  Laterality: Left;   LOWER EXTREMITY ANGIOGRAPHY Right 05/21/2022   Procedure: Lower Extremity Angiography;  Surgeon: Annice Needy, MD;  Location: [facility];  Service: Cardiovascular;  Laterality: Right;   METATARSAL HEAD EXCISION Left 01/10/2021   Procedure: METATARSAL HEAD EXCISION - LEFT 5th;  Surgeon: Rosetta Posner, DPM;  Location: [facility];  Service: Podiatry;  Laterality: Left;   PERIPHERAL VASCULAR CATHETERIZATION Right 01/24/2016   Procedure: Lower Extremity Angiography;  Surgeon: Renford Dills, MD;  Location: [facility];  Service: Cardiovascular;  Laterality: Right;   PERIPHERAL VASCULAR CATHETERIZATION Right 01/25/2016   Procedure: Lower Extremity Angiography;  Surgeon: Renford Dills, MD;  Location: [facility];  Service: Cardiovascular;  Laterality: Right;   TOE AMPUTATION     TONSILLECTOMY     Patient Active Problem List   Diagnosis Date Noted   Status post below-knee amputation of right lower extremity (HCC) 05/31/2022   S/P amputation 05/30/2022   Poorly controlled diabetes mellitus (HCC) 05/30/2022   Acute osteomyelitis of right foot (HCC) 05/19/2022   Wound infection 05/18/2022   Ulcer of right ankle, with fat layer exposed (HCC) 05/18/2022   Non-healing wound of lower extremity, initial encounter 05/18/2022   CAD with Hx of CABG 05/18/2022   Chronic respiratory failure with hypoxia (HCC) 05/18/2022   S/P BKA (below knee amputation), left (HCC) 05/18/2022   COPD (chronic obstructive pulmonary disease) (HCC) 05/18/2022   Perinephric hematoma 03/20/2022   Bacteremia due to Klebsiella pneumoniae 03/19/2022   Hyperglycemia 03/16/2022   Septic shock (HCC) 03/16/2022   Sepsis  secondary to UTI (HCC) 03/15/2022   Acute hypoxemic respiratory failure (HCC) 02/13/2022   Atrial fibrillation with rapid ventricular response (HCC) 02/11/2022   Uncontrolled type 2 diabetes mellitus with hyperglycemia, with long-term current use of insulin (HCC) 02/11/2022   Dyslipidemia 02/11/2022   Hypothyroidism 02/11/2022   Benign prostatic hyperplasia with lower urinary tract symptoms 02/11/2022   Pressure injury of skin of heel 11/04/2021   UTI (urinary tract infection) 10/22/2021   Iron deficiency anemia 10/19/2021   Hyperkalemia 10/14/2021   Acute urinary retention 10/08/2021   Sepsis due to methicillin resistant Staphylococcus aureus (MRSA) (HCC) 10/04/2021   Acute delirium 10/04/2021   Acute on chronic respiratory failure with hypoxia and hypercapnia (HCC) 10/04/2021   Acute respiratory failure with hypoxia and hypercapnia (HCC) 10/03/2021   OSA (obstructive sleep apnea) 10/03/2021   Malnutrition of moderate degree 09/29/2021   Generalized weakness 09/29/2021   Cellulitis of left foot 08/04/2021   Uncontrolled type 2 diabetes mellitus with hypoglycemia, with long-term current use of insulin (HCC) 08/04/2021   Bilateral lower extremity edema 08/04/2021   Acute  osteomyelitis of lower leg, left (HCC) 08/04/2021   Swelling of limb 07/18/2021   CHF exacerbation (HCC) 05/08/2021   Diabetes mellitus without complication (HCC)    Atrial fibrillation with RVR (HCC)    Obesity, Class III, BMI 40-49.9 (morbid obesity) (HCC)    Venous stasis ulcer (HCC)    Sinus tachycardia    BPH (benign prostatic hyperplasia)    Chronic anticoagulation    Anasarca    Chronic diastolic CHF (congestive heart failure) (HCC)    Venous stasis of both lower extremities    AF (paroxysmal atrial fibrillation) (HCC)    Lower extremity cellulitis 01/03/2021   Chronic osteomyelitis of left foot (HCC) 01/03/2021   Osteomyelitis (HCC) 01/03/2021   Severe sepsis (HCC) 02/24/2020   AKI (acute kidney injury)  (HCC) 02/24/2020   Bilateral cellulitis of lower leg 02/23/2020   CAD (coronary artery disease) 02/02/2020   RLS (restless legs syndrome) 05/15/2019   Statin myopathy 05/15/2019   Diabetes mellitus type 2 in obese 02/13/2016   Essential hypertension 02/13/2016   PAD (peripheral artery disease) (HCC) 02/13/2016   Pressure injury of skin 01/25/2016   Hypoglycemia associated with diabetes (HCC) 12/27/2013   Long-term insulin use (HCC) 12/27/2013   Microalbuminuria 12/27/2013   B12 deficiency 09/29/2013   Obesity 09/29/2013   CAD (coronary artery disease), autologous vein bypass graft 07/20/2013   Hypersomnia with sleep apnea 07/20/2013   Pure hypercholesterolemia 07/20/2013   Osteomyelitis of ankle or foot 07/20/2011    PCP: Dr. Aram Beecham  REFERRING PROVIDER: Sheppard Plumber, NP  REFERRING DIAG: [phone] (ICD-10-CM) - Status post below-knee amputation of right lower extremity (HCC)   THERAPY DIAG:  Difficulty in walking, not elsewhere classified  Muscle weakness (generalized)  Other abnormalities of gait and mobility  Other lack of coordination  Abnormality of gait and mobility  Unsteadiness on feet  Rationale for Evaluation and Treatment: Rehabilitation  ONSET DATE: 05/24/2022 (date of surgery for latest Below knee amputation- RLE)   SUBJECTIVE:   SUBJECTIVE STATEMENT: Pt reports continued difficulty donning both of his prostheses. He denies any falls or pain.   PERTINENT HISTORY: Patient is a 67 year old male with CAD/PAF on Eliquis followed by Cardiology.. Obese with BMI>38. HTN stable on meds. Has HLD not on statin due to myopathy. Also with DM on insulin and thyroid dz on Synthroid. (See above section for all PMH) Patient reports he is eager to be here and a long road to recovery- Reports having his left leg amputated last year (10/03/2021) and most recently his right LE - both below the knee amputations. Reports very dependent with all mobility and states ready to  get stronger and on his feet. States just received his prosthesis yesterday.   Patient using [facility] for prosthetic care PAIN:  Are you having pain? No  PRECAUTIONS: Fall  RED FLAGS: None   WEIGHT BEARING RESTRICTIONS: No  FALLS:  Has patient fallen in last 6 months?  Will ask next visit  LIVING ENVIRONMENT: Lives with: lives with their spouse Lives in: House/apartment Stairs:  split level home - 2 step down to den- ramp for entry in home.  Has following equipment at home: Chiropractor, Environmental consultant - 2 wheeled, Wheelchair (manual), [employer], Ramped entry, and [employer], hoyer lift  OCCUPATION: Psychologist, occupational- retired  PLOF: Independent- prior to 10/03/2021 (left BKA)   PATIENT GOALS: "I want to walk with my prostheses"  NEXT MD VISIT: TBA  OBJECTIVE:   TODAY'S TREATMENT:  DATE: 12/11/22   -slide board transfer WC to blue wide table (uphill, slide board transfer) minA for placement under tuberosity, Patient unable today due to weakness. Required dependent transfer from manual w/c to mat via [facility] lift- Lateral scoot transfer training on mat table left to right and right to left x 5 each, Anterior weight shift sitting balance at edge of mat. Several side weight shifting to assist with attempting to don brace and ant/post weight shifting in chair and later on mat Attempted numerous times in seated position in chair and later on mat to don each Prosthesis today and unable to lock pin in.  Session very limited due to increased time spent problem solving inability to don brace. May need to contact [facility] to see if any suggestions if unable to complete by next visit.   PATIENT EDUCATION:  *extensive education on benefits of being in a higher sitting positon in [facility] for exercises, knee/back/hip comfort, donning/doffing prostheses, transfers.   *extensive problem  solving for techiques to improve independence with self donning prosthese legs with minimal physical burden on caregiver   HOME EXERCISE PROGRAM: -determination of a schedule at home for wearing of prosthese and shrinkers; recommended on 3x daily for prostheses with ad lib increase in time. -discussed pros and cons of shrink use/disuse overnight -torubleshooting and practicing self donning of pin in lock of legs   ASSESSMENT:  CLINICAL IMPRESSION: Extensive time spent assist patient to attempting to don his prostheses today and unsuccessful. Continued ed on how to apply the liner with no air pocket. He was unable to perform slide board transfer today and was dependently hoyered over to mat table as well. He demonstrated poor overall sitting balance without back support and will benefit from continued training. May need to contact [facility] if unable to don prosthesis next visit.  He will benefit from skilled PT services to improve his overall LE strength, transfer ability, and possibly able to return to walking with prosthetic training for improved mobility and quality of life.    OBJECTIVE IMPAIRMENTS: decreased activity tolerance, decreased balance, decreased endurance, difficulty walking, decreased strength, and obesity.   ACTIVITY LIMITATIONS: carrying, lifting, bending, standing, squatting, stairs, transfers, bed mobility, bathing, toileting, and dressing  PARTICIPATION LIMITATIONS: cleaning, driving, shopping, community activity, occupation, and yard work  PERSONAL FACTORS: Time since onset of injury/illness/exacerbation and 3+ comorbidities: DM, COPD, PAD  are also affecting patient's functional outcome.   REHAB POTENTIAL: Good  CLINICAL DECISION MAKING: Evolving/moderate complexity  EVALUATION COMPLEXITY: Moderate   GOALS: Goals reviewed with patient? Yes  SHORT TERM GOALS: Target date: 01/09/2023  LONG TERM GOALS: Target date: 02/20/2023   1.  Patient will  demonstrate modified independent with all bed mobility (sup<-> sit)  for optimal function at home  Baseline: EVAL - Will assess 2nd visit Goal status: INITIAL  2.  Patient will increase FOTO score to equal to or greater than  52 to demonstrate statistically significant improvement in mobility and quality of life.  Baseline: EVAL = 41 Goal status: INITIAL   3.  Patient will demonstrate all slide board transfers with Min assist (Bed <-> chair)   Baseline: EVAL= Patient unable to transfer from chair to bed due to increased musle weakness Goal status: INITIAL       PLAN:  PT FREQUENCY: 1-2x/week  PT DURATION: 12 weeks  PLANNED INTERVENTIONS: Therapeutic exercises, Therapeutic activity, Neuromuscular re-education, Balance training, Gait training, Patient/Family education, Self Care, Joint mobilization, Joint manipulation, Vestibular training, Canalith repositioning, Prosthetic  training, DME instructions, Dry Needling, Electrical stimulation, Wheelchair mobility training, Spinal manipulation, Spinal mobilization, Cryotherapy, Moist heat, Compression bandaging, scar mobilization, Manual therapy, and Re-evaluation  PLAN FOR NEXT SESSION: FU on homework for troubleshooting self donning prosthesis and establishing a schedule of wear for srinkers, continue to advance seated weightbearing in prostheses and confidence with anterior weight shift in shofrt sitting, FU on use of WC cushions to establish increased seat height.   9:23 AM, 12/11/22 Louis Meckel, PT Physical Therapist - [facility]  [facility] [phone]     Lenda Kelp, PT 12/11/2022, 9:23 AM

## 2022-12-12 ENCOUNTER — Ambulatory Visit: Payer: Medicare Other

## 2022-12-12 DIAGNOSIS — M6281 Muscle weakness (generalized): Secondary | ICD-10-CM

## 2022-12-12 DIAGNOSIS — R2689 Other abnormalities of gait and mobility: Secondary | ICD-10-CM

## 2022-12-12 DIAGNOSIS — R269 Unspecified abnormalities of gait and mobility: Secondary | ICD-10-CM

## 2022-12-12 DIAGNOSIS — R262 Difficulty in walking, not elsewhere classified: Secondary | ICD-10-CM

## 2022-12-12 DIAGNOSIS — R2681 Unsteadiness on feet: Secondary | ICD-10-CM

## 2022-12-12 DIAGNOSIS — R278 Other lack of coordination: Secondary | ICD-10-CM

## 2022-12-13 ENCOUNTER — Encounter (INDEPENDENT_AMBULATORY_CARE_PROVIDER_SITE_OTHER): Payer: Self-pay | Admitting: Nurse Practitioner

## 2022-12-13 ENCOUNTER — Ambulatory Visit (INDEPENDENT_AMBULATORY_CARE_PROVIDER_SITE_OTHER): Payer: Medicare Other | Admitting: Nurse Practitioner

## 2022-12-13 VITALS — BP 114/68 | HR 85 | Resp 16

## 2022-12-13 DIAGNOSIS — I10 Essential (primary) hypertension: Secondary | ICD-10-CM

## 2022-12-13 DIAGNOSIS — Z89511 Acquired absence of right leg below knee: Secondary | ICD-10-CM | POA: Diagnosis not present

## 2022-12-13 DIAGNOSIS — E119 Type 2 diabetes mellitus without complications: Secondary | ICD-10-CM

## 2022-12-13 DIAGNOSIS — Z89512 Acquired absence of left leg below knee: Secondary | ICD-10-CM

## 2022-12-13 MED ORDER — MUPIROCIN 2 % EX OINT: 1.0000 | TOPICAL_OINTMENT | Freq: Every day | CUTANEOUS | 3 refills | Status: DC

## 2022-12-13 MED ORDER — SULFAMETHOXAZOLE-TRIMETHOPRIM 800-160 MG PO TABS: 1.0000 | ORAL_TABLET | Freq: Two times a day (BID) | ORAL | 0 refills | Status: DC

## 2022-12-13 NOTE — Therapy (Signed)
OUTPATIENT PHYSICAL THERAPY TREATMENT   Patient Name: [name]. MRN: [id number] DOB:05/08/1955, 67 y.o., male Today's Date: 12/13/2022  END OF SESSION:  PT End of Session - 12/12/22 1307     Visit Number 4    Number of Visits 24    Date for PT Re-Evaluation 02/20/23    Authorization Type [employer] Medicare    Progress Note Due on Visit 10    PT Start Time 1446    PT Stop Time 1529    PT Time Calculation (min) 43 min    Equipment Utilized During Treatment Gait belt    Activity Tolerance Patient tolerated treatment well;No increased pain;Patient limited by fatigue    Behavior During Therapy [facility] for tasks assessed/performed              Past Medical History:  Diagnosis Date   Arrhythmia    atrial fibrillation   Asthma    CHF (congestive heart failure) (HCC)    COPD (chronic obstructive pulmonary disease) (HCC)    Coronary artery disease    Depression    Diabetes mellitus without complication (HCC)    Gout    History anabolic steroid use    Hyperlipidemia    Hypertension    Hypogonadism in male    MI (myocardial infarction) (HCC)    Morbid obesity (HCC)    Myocardial infarction (HCC)    Peripheral vascular disease (HCC)    Perirectal abscess    Pleurisy    Sleep apnea    CPAP at night, no oxygen   Varicella    Past Surgical History:  Procedure Laterality Date   ABDOMINAL AORTIC ANEURYSM REPAIR     ACHILLES TENDON SURGERY Left 01/10/2021   Procedure: ACHILLES LENGTHENING/KIDNER;  Surgeon: Rosetta Posner, DPM;  Location: [facility];  Service: Podiatry;  Laterality: Left;   AMPUTATION Left 10/03/2021   Procedure: AMPUTATION BELOW KNEE;  Surgeon: Renford Dills, MD;  Location: [facility];  Service: Vascular;  Laterality: Left;   AMPUTATION Right 05/24/2022   Procedure: AMPUTATION BELOW KNEE;  Surgeon: Annice Needy, MD;  Location: [facility];  Service: General;  Laterality: Right;   AMPUTATION TOE Right 02/10/2016   Procedure: AMPUTATION TOE 3RD TOE;   Surgeon: Gwyneth Revels, DPM;  Location: [facility];  Service: Podiatry;  Laterality: Right;   AMPUTATION TOE Left 02/24/2020   Procedure: AMPUTATION TOE;  Surgeon: Rosetta Posner, DPM;  Location: [facility];  Service: Podiatry;  Laterality: Left;   APPLICATION OF WOUND VAC Left 02/29/2020   Procedure: APPLICATION OF WOUND VAC;  Surgeon: Rosetta Posner, DPM;  Location: [facility];  Service: Podiatry;  Laterality: Left;   CARDIAC CATHETERIZATION     CARDIOVERSION [name] 02/13/2022   Procedure: CARDIOVERSION;  Surgeon: Lamar Blinks, MD;  Location: [facility];  Service: Cardiovascular;  Laterality: [name];   COLONOSCOPY WITH PROPOFOL [name] 11/18/2015   Procedure: COLONOSCOPY WITH PROPOFOL;  Surgeon: Scot Jun, MD;  Location: [facility];  Service: Endoscopy;  Laterality: [name];   CORONARY ARTERY BYPASS GRAFT     CORONARY STENT INTERVENTION [name] 02/02/2020   Procedure: CORONARY STENT INTERVENTION;  Surgeon: Marcina Millard, MD;  Location: [facility];  Service: Cardiovascular;  Laterality: [name];   INCISION AND DRAINAGE Left 08/07/2021   Procedure: INCISION AND DRAINAGE-Partial Calcanectomy;  Surgeon: Rosetta Posner, DPM;  Location: [facility];  Service: Podiatry;  Laterality: Left;   IRRIGATION AND DEBRIDEMENT FOOT Left 02/29/2020   Procedure: IRRIGATION AND DEBRIDEMENT FOOT;  Surgeon: Rosetta Posner, DPM;  Location: [facility];  Service: Podiatry;  Laterality: Left;   IRRIGATION AND DEBRIDEMENT FOOT Left 02/24/2020   Procedure: IRRIGATION AND DEBRIDEMENT FOOT;  Surgeon: Rosetta Posner, DPM;  Location: [facility];  Service: Podiatry;  Laterality: Left;   KNEE ARTHROSCOPY     LEFT HEART CATH AND CORS/GRAFTS ANGIOGRAPHY [name] 02/02/2020   Procedure: LEFT HEART CATH AND CORS/GRAFTS ANGIOGRAPHY;  Surgeon: Dalia Heading, MD;  Location: [facility];  Service: Cardiovascular;  Laterality: [name];   LOWER EXTREMITY ANGIOGRAPHY Left 02/25/2020   Procedure: Lower Extremity Angiography;  Surgeon: Annice Needy, MD;  Location: [facility];  Service: Cardiovascular;  Laterality: Left;   LOWER EXTREMITY ANGIOGRAPHY Left 01/04/2021   Procedure: LOWER EXTREMITY ANGIOGRAPHY;  Surgeon: Annice Needy, MD;  Location: [facility];  Service: Cardiovascular;  Laterality: Left;   LOWER EXTREMITY ANGIOGRAPHY Right 05/21/2022   Procedure: Lower Extremity Angiography;  Surgeon: Annice Needy, MD;  Location: [facility];  Service: Cardiovascular;  Laterality: Right;   METATARSAL HEAD EXCISION Left 01/10/2021   Procedure: METATARSAL HEAD EXCISION - LEFT 5th;  Surgeon: Rosetta Posner, DPM;  Location: [facility];  Service: Podiatry;  Laterality: Left;   PERIPHERAL VASCULAR CATHETERIZATION Right 01/24/2016   Procedure: Lower Extremity Angiography;  Surgeon: Renford Dills, MD;  Location: [facility];  Service: Cardiovascular;  Laterality: Right;   PERIPHERAL VASCULAR CATHETERIZATION Right 01/25/2016   Procedure: Lower Extremity Angiography;  Surgeon: Renford Dills, MD;  Location: [facility];  Service: Cardiovascular;  Laterality: Right;   TOE AMPUTATION     TONSILLECTOMY     Patient Active Problem List   Diagnosis Date Noted   Status post below-knee amputation of right lower extremity (HCC) 05/31/2022   S/P amputation 05/30/2022   Poorly controlled diabetes mellitus (HCC) 05/30/2022   Acute osteomyelitis of right foot (HCC) 05/19/2022   Wound infection 05/18/2022   Ulcer of right ankle, with fat layer exposed (HCC) 05/18/2022   Non-healing wound of lower extremity, initial encounter 05/18/2022   CAD with Hx of CABG 05/18/2022   Chronic respiratory failure with hypoxia (HCC) 05/18/2022   S/P BKA (below knee amputation), left (HCC) 05/18/2022   COPD (chronic obstructive pulmonary disease) (HCC) 05/18/2022   Perinephric hematoma 03/20/2022   Bacteremia due to Klebsiella pneumoniae 03/19/2022   Hyperglycemia 03/16/2022   Septic shock (HCC) 03/16/2022   Sepsis  secondary to UTI (HCC) 03/15/2022   Acute hypoxemic respiratory failure (HCC) 02/13/2022   Atrial fibrillation with rapid ventricular response (HCC) 02/11/2022   Uncontrolled type 2 diabetes mellitus with hyperglycemia, with long-term current use of insulin (HCC) 02/11/2022   Dyslipidemia 02/11/2022   Hypothyroidism 02/11/2022   Benign prostatic hyperplasia with lower urinary tract symptoms 02/11/2022   Pressure injury of skin of heel 11/04/2021   UTI (urinary tract infection) 10/22/2021   Iron deficiency anemia 10/19/2021   Hyperkalemia 10/14/2021   Acute urinary retention 10/08/2021   Sepsis due to methicillin resistant Staphylococcus aureus (MRSA) (HCC) 10/04/2021   Acute delirium 10/04/2021   Acute on chronic respiratory failure with hypoxia and hypercapnia (HCC) 10/04/2021   Acute respiratory failure with hypoxia and hypercapnia (HCC) 10/03/2021   OSA (obstructive sleep apnea) 10/03/2021   Malnutrition of moderate degree 09/29/2021   Generalized weakness 09/29/2021   Cellulitis of left foot 08/04/2021   Uncontrolled type 2 diabetes mellitus with hypoglycemia, with long-term current use of insulin (HCC) 08/04/2021   Bilateral lower extremity edema 08/04/2021   Acute  osteomyelitis of lower leg, left (HCC) 08/04/2021   Swelling of limb 07/18/2021   CHF exacerbation (HCC) 05/08/2021   Diabetes mellitus without complication (HCC)    Atrial fibrillation with RVR (HCC)    Obesity, Class III, BMI 40-49.9 (morbid obesity) (HCC)    Venous stasis ulcer (HCC)    Sinus tachycardia    BPH (benign prostatic hyperplasia)    Chronic anticoagulation    Anasarca    Chronic diastolic CHF (congestive heart failure) (HCC)    Venous stasis of both lower extremities    AF (paroxysmal atrial fibrillation) (HCC)    Lower extremity cellulitis 01/03/2021   Chronic osteomyelitis of left foot (HCC) 01/03/2021   Osteomyelitis (HCC) 01/03/2021   Severe sepsis (HCC) 02/24/2020   AKI (acute kidney injury)  (HCC) 02/24/2020   Bilateral cellulitis of lower leg 02/23/2020   CAD (coronary artery disease) 02/02/2020   RLS (restless legs syndrome) 05/15/2019   Statin myopathy 05/15/2019   Diabetes mellitus type 2 in obese 02/13/2016   Essential hypertension 02/13/2016   PAD (peripheral artery disease) (HCC) 02/13/2016   Pressure injury of skin 01/25/2016   Hypoglycemia associated with diabetes (HCC) 12/27/2013   Long-term insulin use (HCC) 12/27/2013   Microalbuminuria 12/27/2013   B12 deficiency 09/29/2013   Obesity 09/29/2013   CAD (coronary artery disease), autologous vein bypass graft 07/20/2013   Hypersomnia with sleep apnea 07/20/2013   Pure hypercholesterolemia 07/20/2013   Osteomyelitis of ankle or foot 07/20/2011    PCP: Dr. Aram Beecham  REFERRING PROVIDER: Sheppard Plumber, NP  REFERRING DIAG: [phone] (ICD-10-CM) - Status post below-knee amputation of right lower extremity (HCC)   THERAPY DIAG:  Difficulty in walking, not elsewhere classified  Muscle weakness (generalized)  Other abnormalities of gait and mobility  Other lack of coordination  Abnormality of gait and mobility  Unsteadiness on feet  Rationale for Evaluation and Treatment: Rehabilitation  ONSET DATE: 05/24/2022 (date of surgery for latest Below knee amputation- RLE)   SUBJECTIVE:   SUBJECTIVE STATEMENT: Pt reports still unable to get either of his prosthesis donned  PERTINENT HISTORY: Patient is a 67 year old male with CAD/PAF on Eliquis followed by Cardiology.. Obese with BMI>38. HTN stable on meds. Has HLD not on statin due to myopathy. Also with DM on insulin and thyroid dz on Synthroid. (See above section for all PMH) Patient reports he is eager to be here and a long road to recovery- Reports having his left leg amputated last year (10/03/2021) and most recently his right LE - both below the knee amputations. Reports very dependent with all mobility and states ready to get stronger and on his feet.  States just received his prosthesis yesterday.   Patient using [facility] for prosthetic care PAIN:  Are you having pain? No  PRECAUTIONS: Fall  RED FLAGS: None   WEIGHT BEARING RESTRICTIONS: No  FALLS:  Has patient fallen in last 6 months?  Will ask next visit  LIVING ENVIRONMENT: Lives with: lives with their spouse Lives in: House/apartment Stairs:  split level home - 2 step down to den- ramp for entry in home.  Has following equipment at home: Chiropractor, Environmental consultant - 2 wheeled, Wheelchair (manual), [employer], Ramped entry, and [employer], hoyer lift  OCCUPATION: Psychologist, occupational- retired  PLOF: Independent- prior to 10/03/2021 (left BKA)   PATIENT GOALS: "I want to walk with my prostheses"  NEXT MD VISIT: TBA  OBJECTIVE:   TODAY'S TREATMENT:  DATE: 12/12/2022  Prosthetic training:  PT worked upon arrival to don bilateral Below the knee prostheses- readjusted liner and with much physical effort able to achieve 3-4 clicks per leg today.  Patient was then dependently transferred via hoyer from manual w/c to mat table.   Therapeutic activities-   Static sitting at edge of mat without back support and feet supported on floor. VC for anterior weight shift.    Therapeutic Exercises:  Dynamic LE activities- Hip march, knee ext, hip abd/add, and active Knee flex-  2 sets of 10 reps    PATIENT EDUCATION:  *extensive education on benefits of being in a higher sitting positon in [facility] for exercises, knee/back/hip comfort, donning/doffing prostheses, transfers.   *extensive problem solving for techiques to improve independence with self donning prosthese legs with minimal physical burden on caregiver   HOME EXERCISE PROGRAM: -determination of a schedule at home for wearing of prosthese and shrinkers; recommended on 3x daily for prostheses with ad lib  increase in time. -discussed pros and cons of shrink use/disuse overnight -torubleshooting and practicing self donning of pin in lock of legs   ASSESSMENT:  CLINICAL IMPRESSION: After increased time- PT was able to don both prostheses today and patient was able to sit at [facility] and perform some sitting balance- static and dynamic. He was happy to have his prosthesis on to perform some LE strengthening.   He will benefit from skilled PT services to improve his overall LE strength, transfer ability, and possibly able to return to walking with prosthetic training for improved mobility and quality of life.    OBJECTIVE IMPAIRMENTS: decreased activity tolerance, decreased balance, decreased endurance, difficulty walking, decreased strength, and obesity.   ACTIVITY LIMITATIONS: carrying, lifting, bending, standing, squatting, stairs, transfers, bed mobility, bathing, toileting, and dressing  PARTICIPATION LIMITATIONS: cleaning, driving, shopping, community activity, occupation, and yard work  PERSONAL FACTORS: Time since onset of injury/illness/exacerbation and 3+ comorbidities: DM, COPD, PAD  are also affecting patient's functional outcome.   REHAB POTENTIAL: Good  CLINICAL DECISION MAKING: Evolving/moderate complexity  EVALUATION COMPLEXITY: Moderate   GOALS: Goals reviewed with patient? Yes  SHORT TERM GOALS: Target date: 01/09/2023  LONG TERM GOALS: Target date: 02/20/2023   1.  Patient will demonstrate modified independent with all bed mobility (sup<-> sit)  for optimal function at home  Baseline: EVAL - Will assess 2nd visit Goal status: INITIAL  2.  Patient will increase FOTO score to equal to or greater than  52 to demonstrate statistically significant improvement in mobility and quality of life.  Baseline: EVAL = 41 Goal status: INITIAL   3.  Patient will demonstrate all slide board transfers with Min assist (Bed <-> chair)   Baseline: EVAL= Patient unable to transfer from  chair to bed due to increased musle weakness Goal status: INITIAL       PLAN:  PT FREQUENCY: 1-2x/week  PT DURATION: 12 weeks  PLANNED INTERVENTIONS: Therapeutic exercises, Therapeutic activity, Neuromuscular re-education, Balance training, Gait training, Patient/Family education, Self Care, Joint mobilization, Joint manipulation, Vestibular training, Canalith repositioning, Prosthetic training, DME instructions, Dry Needling, Electrical stimulation, Wheelchair mobility training, Spinal manipulation, Spinal mobilization, Cryotherapy, Moist heat, Compression bandaging, scar mobilization, Manual therapy, and Re-evaluation  PLAN FOR NEXT SESSION: FU on homework for troubleshooting self donning prosthesis and establishing a schedule of wear for srinkers, continue to advance seated weightbearing in prostheses and confidence with anterior weight shift in shofrt sitting, FU on use of WC cushions to establish increased seat height.  5:25 PM, 12/13/22 Louis Meckel, PT Physical Therapist - [facility]  [facility] [phone]     Lenda Kelp, PT 12/13/2022, 5:25 PM

## 2022-12-14 ENCOUNTER — Encounter (INDEPENDENT_AMBULATORY_CARE_PROVIDER_SITE_OTHER): Payer: Self-pay | Admitting: Nurse Practitioner

## 2022-12-14 NOTE — Progress Notes (Signed)
Subjective:    Patient ID: [name]., male    DOB: 01-Apr-1956, 67 y.o.   MRN: [id number] Chief Complaint  Patient presents with   Follow-up    8 week follow up    The patient returns today for evaluation of his right below-knee amputation site.  Today the site is fairly well-healed with just a very small scabbed area however the patient notes that along the incision line he has a scab formed daily.  This scab is not tightly adherent and he peels it off every morning.  He is very diligent with using his shrinker socks on his sites daily and he also utilized A&E ointment over the area.  There does not appear to be any open areas or cellulitis or gross infection of the site today.  There is no notable drainage coming from the incision at this timeframe.  Overall he has been doing fairly well and is continue to work with physical therapy.    Review of Systems  Musculoskeletal:  Positive for gait problem.  Neurological:  Positive for weakness.  All other systems reviewed and are negative.      Objective:   Physical Exam Vitals reviewed.  HENT:     Head: Normocephalic.  Cardiovascular:     Rate and Rhythm: Normal rate.  Pulmonary:     Effort: Pulmonary effort is normal.  Musculoskeletal:     Right Lower Extremity: Right leg is amputated below knee.     Left Lower Extremity: Left leg is amputated below knee.  Skin:    General: Skin is warm and dry.  Neurological:     Mental Status: He is alert and oriented to person, place, and time.  Psychiatric:        Mood and Affect: Mood normal.        Behavior: Behavior normal.        Thought Content: Thought content normal.        Judgment: Judgment normal.     BP 114/68 (BP Location: Left Arm)   Pulse 85   Resp 16   Past Medical History:  Diagnosis Date   Arrhythmia    atrial fibrillation   Asthma    CHF (congestive heart failure) (HCC)    COPD (chronic obstructive pulmonary disease) (HCC)    Coronary artery  disease    Depression    Diabetes mellitus without complication (HCC)    Gout    History anabolic steroid use    Hyperlipidemia    Hypertension    Hypogonadism in male    MI (myocardial infarction) (HCC)    Morbid obesity (HCC)    Myocardial infarction (HCC)    Peripheral vascular disease (HCC)    Perirectal abscess    Pleurisy    Sleep apnea    CPAP at night, no oxygen   Varicella     Social History   Socioeconomic History   Marital status: Married    Spouse name: Not on file   Number of children: Not on file   Years of education: Not on file   Highest education level: Not on file  Occupational History   Not on file  Tobacco Use   Smoking status: Former    Current packs/day: 0.00    Average packs/day: 0.5 packs/day for 45.0 years (22.5 ttl pk-yrs)    Types: Cigarettes    Start date: 04/04/1970    Quit date: 04/05/2015    Years since quitting: 7.6   Smokeless tobacco: Never  Vaping Use   Vaping status: Every Day  Substance and Sexual Activity   Alcohol use: Not Currently    Alcohol/week: 3.0 standard drinks of alcohol    Types: 3 Glasses of wine per week   Drug use: No   Sexual activity: Not on file  Other Topics Concern   Not on file  Social History Narrative   Not on file   Social Determinants of Health   Financial Resource Strain: Low Risk  (09/28/2022)   Received from [facility], [facility]   Overall Financial Resource Strain (CARDIA)    Difficulty of Paying Living Expenses: Not hard at all  Food Insecurity: No Food Insecurity (09/28/2022)   Received from [facility], [facility]   Hunger Vital Sign    Worried About Running Out of Food in the Last Year: Never true    Ran Out of Food in the Last Year: Never true  Transportation Needs: No Transportation Needs (09/28/2022)   Received from [facility], [facility]   [facility] - Transportation    In  the past 12 months, has lack of transportation kept you from medical appointments or from getting medications?: No    Lack of Transportation (Non-Medical): No  Physical Activity: Not on file  Stress: Not on file  Social Connections: Not on file  Intimate Partner Violence: Not At Risk (05/19/2022)   Humiliation, Afraid, Rape, and Kick questionnaire    Fear of Current or Ex-Partner: No    Emotionally Abused: No    Physically Abused: No    Sexually Abused: No    Past Surgical History:  Procedure Laterality Date   ABDOMINAL AORTIC ANEURYSM REPAIR     ACHILLES TENDON SURGERY Left 01/10/2021   Procedure: ACHILLES LENGTHENING/KIDNER;  Surgeon: Rosetta Posner, DPM;  Location: [facility];  Service: Podiatry;  Laterality: Left;   AMPUTATION Left 10/03/2021   Procedure: AMPUTATION BELOW KNEE;  Surgeon: Renford Dills, MD;  Location: [facility];  Service: Vascular;  Laterality: Left;   AMPUTATION Right 05/24/2022   Procedure: AMPUTATION BELOW KNEE;  Surgeon: Annice Needy, MD;  Location: [facility];  Service: General;  Laterality: Right;   AMPUTATION TOE Right 02/10/2016   Procedure: AMPUTATION TOE 3RD TOE;  Surgeon: Gwyneth Revels, DPM;  Location: [facility];  Service: Podiatry;  Laterality: Right;   AMPUTATION TOE Left 02/24/2020   Procedure: AMPUTATION TOE;  Surgeon: Rosetta Posner, DPM;  Location: [facility];  Service: Podiatry;  Laterality: Left;   APPLICATION OF WOUND VAC Left 02/29/2020   Procedure: APPLICATION OF WOUND VAC;  Surgeon: Rosetta Posner, DPM;  Location: [facility];  Service: Podiatry;  Laterality: Left;   CARDIAC CATHETERIZATION     CARDIOVERSION [name] 02/13/2022   Procedure: CARDIOVERSION;  Surgeon: Lamar Blinks, MD;  Location: [facility];  Service: Cardiovascular;  Laterality: [name];   COLONOSCOPY WITH PROPOFOL [name] 11/18/2015   Procedure: COLONOSCOPY WITH PROPOFOL;  Surgeon: Scot Jun, MD;  Location: [facility];  Service: Endoscopy;  Laterality: [name];   CORONARY ARTERY BYPASS  GRAFT     CORONARY STENT INTERVENTION [name] 02/02/2020   Procedure: CORONARY STENT INTERVENTION;  Surgeon: Marcina Millard, MD;  Location: [facility] INV[facility]IVE CV [facility];  Service: Cardiovascular;  Laterality: [name];   INCISION AND DRAINAGE Left 08/07/2021   Procedure: INCISION AND DRAINAGE-Partial Calcanectomy;  Surgeon: Rosetta Posner, DPM;  Location: [facility];  Service: Podiatry;  Laterality: Left;   IRRIGATION AND DEBRIDEMENT FOOT Left 02/29/2020  Procedure: IRRIGATION AND DEBRIDEMENT FOOT;  Surgeon: Rosetta Posner, DPM;  Location: [facility];  Service: Podiatry;  Laterality: Left;   IRRIGATION AND DEBRIDEMENT FOOT Left 02/24/2020   Procedure: IRRIGATION AND DEBRIDEMENT FOOT;  Surgeon: Rosetta Posner, DPM;  Location: [facility];  Service: Podiatry;  Laterality: Left;   KNEE ARTHROSCOPY     LEFT HEART CATH AND CORS/GRAFTS ANGIOGRAPHY [name] 02/02/2020   Procedure: LEFT HEART CATH AND CORS/GRAFTS ANGIOGRAPHY;  Surgeon: Dalia Heading, MD;  Location: [facility];  Service: Cardiovascular;  Laterality: [name];   LOWER EXTREMITY ANGIOGRAPHY Left 02/25/2020   Procedure: Lower Extremity Angiography;  Surgeon: Annice Needy, MD;  Location: [facility];  Service: Cardiovascular;  Laterality: Left;   LOWER EXTREMITY ANGIOGRAPHY Left 01/04/2021   Procedure: LOWER EXTREMITY ANGIOGRAPHY;  Surgeon: Annice Needy, MD;  Location: [facility];  Service: Cardiovascular;  Laterality: Left;   LOWER EXTREMITY ANGIOGRAPHY Right 05/21/2022   Procedure: Lower Extremity Angiography;  Surgeon: Annice Needy, MD;  Location: [facility];  Service: Cardiovascular;  Laterality: Right;   METATARSAL HEAD EXCISION Left 01/10/2021   Procedure: METATARSAL HEAD EXCISION - LEFT 5th;  Surgeon: Rosetta Posner, DPM;  Location: [facility];  Service: Podiatry;  Laterality: Left;   PERIPHERAL VASCULAR CATHETERIZATION Right 01/24/2016   Procedure: Lower Extremity Angiography;  Surgeon: Renford Dills, MD;  Location: [facility]  INVASIVE CV [facility];  Service: Cardiovascular;  Laterality: Right;   PERIPHERAL VASCULAR CATHETERIZATION Right 01/25/2016   Procedure: Lower Extremity Angiography;  Surgeon: Renford Dills, MD;  Location: [facility];  Service: Cardiovascular;  Laterality: Right;   TOE AMPUTATION     TONSILLECTOMY      Family History  Problem Relation Age of Onset   Hypertension Father    Coronary artery disease Father    Alcohol abuse Father    Heart failure Brother     Allergies  Allergen Reactions   Penicillins Other (See Comments)    Happened at 67 years old and pt. stated he passed out  He has tolerated amoxicillin/clavulanate and ampicillin/sulbactam   Statins     Other reaction(s): Muscle Pain Causes legs to ache per pt   Metformin And Related Diarrhea       Latest Ref Rng & Units 06/06/2022    2:59 AM 06/05/2022    3:21 AM 06/04/2022    3:43 AM  CBC  WBC 4.0 - 10.5 K/uL 9.8  10.9  11.0   Hemoglobin 13.0 - 17.0 g/dL 8.5  8.4  8.5   Hematocrit 39.0 - 52.0 % 27.6  27.4  27.6   Platelets 150 - 400 K/uL 310  349  357       CMP     Component Value Date/Time   NA 135 06/06/2022 0259   NA 141 07/12/2011 0919   K 4.6 06/06/2022 0259   K 4.4 07/12/2011 0919   CL 103 06/06/2022 0259   CL 102 07/12/2011 0919   CO2 26 06/06/2022 0259   CO2 29 07/12/2011 0919   GLUCOSE 161 (H) 06/06/2022 0259   GLUCOSE 76 07/12/2011 0919   BUN 49 (H) 06/06/2022 0259   BUN 16 05/05/2013 1022   CREATININE 1.90 (H) 08/13/2022 1412   CREATININE 0.71 05/05/2013 1022   CALCIUM 8.8 (L) 06/06/2022 0259   CALCIUM 8.8 07/12/2011 0919   PROT 7.4 05/18/2022 1527   ALBUMIN 2.6 (L) 05/18/2022 1527   AST 18 05/18/2022 1527   ALT 25 05/18/2022 1527  ALKPHOS 170 (H) 05/18/2022 1527   BILITOT 0.3 05/18/2022 1527   GFRNONAA >60 06/06/2022 0259   GFRNONAA >60 05/05/2013 1022     No results found.     Assessment & Plan:   1. Status post below-knee amputation of right lower extremity (HCC) For the  most part the amputation site is healed however he notes that along the incision line there is scabbing daily.  While there is not a significant open wound or oozing noted today I suspect that he may have some underlying infection leading to the ongoing scab formation.  He notes that he actually feels a soft daily.  I have asked him to take a picture so that we can evaluate if it continues at his follow-up visit.  I have instructed him to utilize mupirocin along the incision line as well as if given Bactrim as well.  Patient will return in about 8 weeks for wound evaluation.  2. Hx of BKA, left (HCC) This amputation site is doing well without significant issue.  However he does note that his shrinker in his legs.  He has been given a prescription for a new shrinker to help and we will place that he has 1 that fits properly and provides adequate compression  3. Diabetes mellitus without complication (HCC) Continue hypoglycemic medications as already ordered, these medications have been reviewed and there are no changes at this time.  Hgb A1C to be monitored as already arranged by primary service  4. Essential hypertension Continue antihypertensive medications as already ordered, these medications have been reviewed and there are no changes at this time.   Current Outpatient Medications on File Prior to Visit  Medication Sig Dispense Refill   acetaminophen (TYLENOL) 325 MG tablet Take 2 tablets (650 mg total) by mouth every 6 (six) hours as needed for mild pain (or Fever >/= 101).     amiodarone (PACERONE) 200 MG tablet Take 1 tablet (200 mg total) by mouth daily. 90 tablet 3   apixaban (ELIQUIS) 5 MG TABS tablet Take 5 mg by mouth 2 (two) times daily.     aspirin EC 81 MG tablet Take 81 mg by mouth daily. Swallow whole.     budesonide (PULMICORT) 0.5 MG/2ML nebulizer solution Take 2 mLs (0.5 mg total) by nebulization 2 (two) times daily. 120 mL 0   Cholecalciferol 25 MCG (1000 UT) tablet Take  1,000 Units by mouth daily.     Continuous Blood Gluc Sensor (FREESTYLE LIBRE 3 SENSOR) MISC      Cyanocobalamin (VITAMIN B-12) 5000 MCG TBDP Take 10,000 mcg by mouth daily.     diltiazem (CARDIZEM CD) 300 MG 24 hr capsule Take 1 capsule (300 mg total) by mouth daily. 90 capsule 1   ezetimibe (ZETIA) 10 MG tablet Take 10 mg by mouth at bedtime.     ferrous sulfate 325 (65 FE) MG tablet Take 325 mg by mouth daily with breakfast.     finasteride (PROSCAR) 5 MG tablet Take 1 tablet (5 mg total) by mouth daily. 30 tablet 0   furosemide (LASIX) 20 MG tablet Take 1 tablet (20 mg total) by mouth ONCE OR TWICE daily (total daily dose maximum 40 mg) as needed for increased swelling, shortness of breath, weight gain 5+ lbs over 1-2 days. Seek medical care if these symptoms are not improving with increased dose, or if you become dizzy or have low blood pressure. 30 tablet 0   gabapentin (NEURONTIN) 300 MG capsule Take 300 mg by mouth  at bedtime.     HUMALOG 100 UNIT/ML injection Inject 15 Units into the skin 3 (three) times daily with meals. >200     Infant Care Products (DERMACLOUD) OINT Apply 1 Application topically daily as needed.     insulin glargine (LANTUS) 100 UNIT/ML injection Inject 15 units into the skin daily in AM and inject 25 units into the skin daily in PM 10 mL 11   isosorbide mononitrate (IMDUR) 60 MG 24 hr tablet Take 1 tablet (60 mg total) by mouth in the morning and at bedtime. 180 tablet 3   levothyroxine (SYNTHROID) 25 MCG tablet Take 1 tablet (25 mcg total) by mouth daily at 6 (six) AM. 30 tablet 0   losartan (COZAAR) 25 MG tablet Take 1 tablet (25 mg total) by mouth daily. 30 tablet 0   Multiple Vitamins-Minerals (MULTIVITAMIN WITH MINERALS) tablet Take 1 tablet by mouth daily.     nitrofurantoin, macrocrystal-monohydrate, (MACROBID) 100 MG capsule Take 100 mg by mouth daily.     nitroGLYCERIN (NITROSTAT) 0.4 MG SL tablet Place 1 tablet (0.4 mg total) under the tongue every 5 (five)  minutes as needed for chest pain. 25 tablet 3   nutrition supplement, JUVEN, (JUVEN) PACK Take 1 packet by mouth 2 (two) times daily between meals. 60 packet 0   nystatin (MYCOSTATIN/NYSTOP) powder Apply 1 Application topically 2 (two) times daily.     ondansetron (ZOFRAN) 4 MG tablet Take 1 tablet (4 mg total) by mouth every 6 (six) hours as needed for nausea. 20 tablet 0   pramipexole (MIRAPEX) 1 MG tablet Take 2 mg by mouth at bedtime.     primidone (MYSOLINE) 250 MG tablet Take 250 mg by mouth 2 (two) times daily.     risperiDONE (RISPERDAL) 0.5 MG tablet Take 0.5 tablets (0.25 mg total) by mouth 2 (two) times daily. 60 tablet 0   tamsulosin (FLOMAX) 0.4 MG CAPS capsule Take 0.4 mg by mouth daily.     vitamin C (ASCORBIC ACID) 500 MG tablet Take 500 mg by mouth daily.     zinc gluconate 50 MG tablet Take 50 mg by mouth daily.     zinc sulfate 220 (50 Zn) MG capsule Take 1 capsule (220 mg total) by mouth daily.     No current facility-administered medications on file prior to visit.    There are no Patient Instructions on file for this visit. No follow-ups on file.   Georgiana Spinner, NP

## 2022-12-17 ENCOUNTER — Ambulatory Visit: Payer: Medicare Other

## 2022-12-17 DIAGNOSIS — R262 Difficulty in walking, not elsewhere classified: Secondary | ICD-10-CM | POA: Diagnosis not present

## 2022-12-17 DIAGNOSIS — R278 Other lack of coordination: Secondary | ICD-10-CM

## 2022-12-17 DIAGNOSIS — R2689 Other abnormalities of gait and mobility: Secondary | ICD-10-CM

## 2022-12-17 DIAGNOSIS — M6281 Muscle weakness (generalized): Secondary | ICD-10-CM

## 2022-12-17 DIAGNOSIS — R269 Unspecified abnormalities of gait and mobility: Secondary | ICD-10-CM

## 2022-12-17 DIAGNOSIS — R2681 Unsteadiness on feet: Secondary | ICD-10-CM

## 2022-12-17 NOTE — Therapy (Signed)
OUTPATIENT PHYSICAL THERAPY TREATMENT   Patient Name: [name]. MRN: [id number] DOB:25-Sep-1955, 67 y.o., male Today's Date: 12/17/2022  END OF SESSION:  PT End of Session - 12/17/22 1441     Visit Number 5    Number of Visits 24    Date for PT Re-Evaluation 02/20/23    Authorization Type BCBS Medicare    Progress Note Due on Visit 10    PT Start Time 1401    PT Stop Time 1440    PT Time Calculation (min) 39 min    Equipment Utilized During Treatment Gait belt    Activity Tolerance Patient tolerated treatment well;No increased pain;Patient limited by fatigue    Behavior During Therapy [facility] for tasks assessed/performed              Past Medical History:  Diagnosis Date   Arrhythmia    atrial fibrillation   Asthma    CHF (congestive heart failure) (HCC)    COPD (chronic obstructive pulmonary disease) (HCC)    Coronary artery disease    Depression    Diabetes mellitus without complication (HCC)    Gout    History anabolic steroid use    Hyperlipidemia    Hypertension    Hypogonadism in male    MI (myocardial infarction) (HCC)    Morbid obesity (HCC)    Myocardial infarction (HCC)    Peripheral vascular disease (HCC)    Perirectal abscess    Pleurisy    Sleep apnea    CPAP at night, no oxygen   Varicella    Past Surgical History:  Procedure Laterality Date   ABDOMINAL AORTIC ANEURYSM REPAIR     ACHILLES TENDON SURGERY Left 01/10/2021   Procedure: ACHILLES LENGTHENING/KIDNER;  Surgeon: Rosetta Posner, DPM;  Location: [facility];  Service: Podiatry;  Laterality: Left;   AMPUTATION Left 10/03/2021   Procedure: AMPUTATION BELOW KNEE;  Surgeon: Renford Dills, MD;  Location: [facility];  Service: Vascular;  Laterality: Left;   AMPUTATION Right 05/24/2022   Procedure: AMPUTATION BELOW KNEE;  Surgeon: Annice Needy, MD;  Location: [facility];  Service: General;  Laterality: Right;   AMPUTATION TOE Right 02/10/2016   Procedure: AMPUTATION TOE 3RD TOE;   Surgeon: Gwyneth Revels, DPM;  Location: [facility];  Service: Podiatry;  Laterality: Right;   AMPUTATION TOE Left 02/24/2020   Procedure: AMPUTATION TOE;  Surgeon: Rosetta Posner, DPM;  Location: [facility];  Service: Podiatry;  Laterality: Left;   APPLICATION OF WOUND VAC Left 02/29/2020   Procedure: APPLICATION OF WOUND VAC;  Surgeon: Rosetta Posner, DPM;  Location: [facility];  Service: Podiatry;  Laterality: Left;   CARDIAC CATHETERIZATION     CARDIOVERSION [name] 02/13/2022   Procedure: CARDIOVERSION;  Surgeon: Lamar Blinks, MD;  Location: [facility];  Service: Cardiovascular;  Laterality: [name];   COLONOSCOPY WITH PROPOFOL [name] 11/18/2015   Procedure: COLONOSCOPY WITH PROPOFOL;  Surgeon: Scot Jun, MD;  Location: [facility];  Service: Endoscopy;  Laterality: [name];   CORONARY ARTERY BYPASS GRAFT     CORONARY STENT INTERVENTION [name] 02/02/2020   Procedure: CORONARY STENT INTERVENTION;  Surgeon: Marcina Millard, MD;  Location: [facility];  Service: Cardiovascular;  Laterality: [name];   INCISION AND DRAINAGE Left 08/07/2021   Procedure: INCISION AND DRAINAGE-Partial Calcanectomy;  Surgeon: Rosetta Posner, DPM;  Location: [facility];  Service: Podiatry;  Laterality: Left;   IRRIGATION AND DEBRIDEMENT FOOT Left 02/29/2020   Procedure: IRRIGATION AND DEBRIDEMENT FOOT;  Surgeon: Rosetta Posner, DPM;  Location: [facility];  Service: Podiatry;  Laterality: Left;   IRRIGATION AND DEBRIDEMENT FOOT Left 02/24/2020   Procedure: IRRIGATION AND DEBRIDEMENT FOOT;  Surgeon: Rosetta Posner, DPM;  Location: [facility];  Service: Podiatry;  Laterality: Left;   KNEE ARTHROSCOPY     LEFT HEART CATH AND CORS/GRAFTS ANGIOGRAPHY [name] 02/02/2020   Procedure: LEFT HEART CATH AND CORS/GRAFTS ANGIOGRAPHY;  Surgeon: Dalia Heading, MD;  Location: [facility];  Service: Cardiovascular;  Laterality: [name];   LOWER EXTREMITY ANGIOGRAPHY Left 02/25/2020   Procedure: Lower Extremity Angiography;  Surgeon: Annice Needy, MD;  Location: [facility];  Service: Cardiovascular;  Laterality: Left;   LOWER EXTREMITY ANGIOGRAPHY Left 01/04/2021   Procedure: LOWER EXTREMITY ANGIOGRAPHY;  Surgeon: Annice Needy, MD;  Location: [facility];  Service: Cardiovascular;  Laterality: Left;   LOWER EXTREMITY ANGIOGRAPHY Right 05/21/2022   Procedure: Lower Extremity Angiography;  Surgeon: Annice Needy, MD;  Location: [facility];  Service: Cardiovascular;  Laterality: Right;   METATARSAL HEAD EXCISION Left 01/10/2021   Procedure: METATARSAL HEAD EXCISION - LEFT 5th;  Surgeon: Rosetta Posner, DPM;  Location: [facility];  Service: Podiatry;  Laterality: Left;   PERIPHERAL VASCULAR CATHETERIZATION Right 01/24/2016   Procedure: Lower Extremity Angiography;  Surgeon: Renford Dills, MD;  Location: [facility];  Service: Cardiovascular;  Laterality: Right;   PERIPHERAL VASCULAR CATHETERIZATION Right 01/25/2016   Procedure: Lower Extremity Angiography;  Surgeon: Renford Dills, MD;  Location: [facility];  Service: Cardiovascular;  Laterality: Right;   TOE AMPUTATION     TONSILLECTOMY     Patient Active Problem List   Diagnosis Date Noted   Status post below-knee amputation of right lower extremity (HCC) 05/31/2022   S/P amputation 05/30/2022   Poorly controlled diabetes mellitus (HCC) 05/30/2022   Acute osteomyelitis of right foot (HCC) 05/19/2022   Wound infection 05/18/2022   Ulcer of right ankle, with fat layer exposed (HCC) 05/18/2022   Non-healing wound of lower extremity, initial encounter 05/18/2022   CAD with Hx of CABG 05/18/2022   Chronic respiratory failure with hypoxia (HCC) 05/18/2022   S/P BKA (below knee amputation), left (HCC) 05/18/2022   COPD (chronic obstructive pulmonary disease) (HCC) 05/18/2022   Perinephric hematoma 03/20/2022   Bacteremia due to Klebsiella pneumoniae 03/19/2022   Hyperglycemia 03/16/2022   Septic shock (HCC) 03/16/2022   Sepsis  secondary to UTI (HCC) 03/15/2022   Acute hypoxemic respiratory failure (HCC) 02/13/2022   Atrial fibrillation with rapid ventricular response (HCC) 02/11/2022   Uncontrolled type 2 diabetes mellitus with hyperglycemia, with long-term current use of insulin (HCC) 02/11/2022   Dyslipidemia 02/11/2022   Hypothyroidism 02/11/2022   Benign prostatic hyperplasia with lower urinary tract symptoms 02/11/2022   Pressure injury of skin of heel 11/04/2021   UTI (urinary tract infection) 10/22/2021   Iron deficiency anemia 10/19/2021   Hyperkalemia 10/14/2021   Acute urinary retention 10/08/2021   Sepsis due to methicillin resistant Staphylococcus aureus (MRSA) (HCC) 10/04/2021   Acute delirium 10/04/2021   Acute on chronic respiratory failure with hypoxia and hypercapnia (HCC) 10/04/2021   Acute respiratory failure with hypoxia and hypercapnia (HCC) 10/03/2021   OSA (obstructive sleep apnea) 10/03/2021   Malnutrition of moderate degree 09/29/2021   Generalized weakness 09/29/2021   Cellulitis of left foot 08/04/2021   Uncontrolled type 2 diabetes mellitus with hypoglycemia, with long-term current use of insulin (HCC) 08/04/2021   Bilateral lower extremity edema 08/04/2021   Acute  osteomyelitis of lower leg, left (HCC) 08/04/2021   Swelling of limb 07/18/2021   CHF exacerbation (HCC) 05/08/2021   Diabetes mellitus without complication (HCC)    Atrial fibrillation with RVR (HCC)    Obesity, Class III, BMI 40-49.9 (morbid obesity) (HCC)    Venous stasis ulcer (HCC)    Sinus tachycardia    BPH (benign prostatic hyperplasia)    Chronic anticoagulation    Anasarca    Chronic diastolic CHF (congestive heart failure) (HCC)    Venous stasis of both lower extremities    AF (paroxysmal atrial fibrillation) (HCC)    Lower extremity cellulitis 01/03/2021   Chronic osteomyelitis of left foot (HCC) 01/03/2021   Osteomyelitis (HCC) 01/03/2021   Severe sepsis (HCC) 02/24/2020   AKI (acute kidney injury)  (HCC) 02/24/2020   Bilateral cellulitis of lower leg 02/23/2020   CAD (coronary artery disease) 02/02/2020   RLS (restless legs syndrome) 05/15/2019   Statin myopathy 05/15/2019   Diabetes mellitus type 2 in obese 02/13/2016   Essential hypertension 02/13/2016   PAD (peripheral artery disease) (HCC) 02/13/2016   Pressure injury of skin 01/25/2016   Hypoglycemia associated with diabetes (HCC) 12/27/2013   Long-term insulin use (HCC) 12/27/2013   Microalbuminuria 12/27/2013   B12 deficiency 09/29/2013   Obesity 09/29/2013   CAD (coronary artery disease), autologous vein bypass graft 07/20/2013   Hypersomnia with sleep apnea 07/20/2013   Pure hypercholesterolemia 07/20/2013   Osteomyelitis of ankle or foot 07/20/2011    PCP: Dr. Aram Beecham  REFERRING PROVIDER: Sheppard Plumber, NP  REFERRING DIAG: [phone] (ICD-10-CM) - Status post below-knee amputation of right lower extremity (HCC)   THERAPY DIAG:  Difficulty in walking, not elsewhere classified  Muscle weakness (generalized)  Other abnormalities of gait and mobility  Other lack of coordination  Abnormality of gait and mobility  Unsteadiness on feet  Rationale for Evaluation and Treatment: Rehabilitation  ONSET DATE: 05/24/2022 (date of surgery for latest Below knee amputation- RLE)   SUBJECTIVE:   SUBJECTIVE STATEMENT: Pt reports frustrated over not being able to don his prostheses yet on his own. States otherwise doing okay.   PERTINENT HISTORY: Patient is a 67 year old male with CAD/PAF on Eliquis followed by Cardiology.. Obese with BMI>38. HTN stable on meds. Has HLD not on statin due to myopathy. Also with DM on insulin and thyroid dz on Synthroid. (See above section for all PMH) Patient reports he is eager to be here and a long road to recovery- Reports having his left leg amputated last year (10/03/2021) and most recently his right LE - both below the knee amputations. Reports very dependent with all mobility and  states ready to get stronger and on his feet. States just received his prosthesis yesterday.   Patient using [facility] for prosthetic care PAIN:  Are you having pain? No  PRECAUTIONS: Fall  RED FLAGS: None   WEIGHT BEARING RESTRICTIONS: No  FALLS:  Has patient fallen in last 6 months?  Will ask next visit  LIVING ENVIRONMENT: Lives with: lives with their spouse Lives in: House/apartment Stairs:  split level home - 2 step down to den- ramp for entry in home.  Has following equipment at home: Chiropractor, Environmental consultant - 2 wheeled, Wheelchair (manual), [employer], Ramped entry, and [employer], hoyer lift  OCCUPATION: Psychologist, occupational- retired  PLOF: Independent- prior to 10/03/2021 (left BKA)   PATIENT GOALS: "I want to walk with my prostheses"  NEXT MD VISIT: TBA  OBJECTIVE:   TODAY'S TREATMENT:  DATE: 12/17/22   Prosthetic training:  PT worked upon arrival to don bilateral Below the knee prostheses- readjusted liner and able to don with 3-4 clicks on 1st trial. Patient was wheeled up into // bars and performed the following:  Sit to Stand with Max A +2 in // bars (max difficulty clearing glutes off chair on 1st attept but improved on 2nd and 3rd trial)   Static standing in // bars with max A+2 x several attempts (unable to ant weight shift to achieve good erect alignment (hips too posterior and feet to far anterior) - at most approx 25-30 sec  with glute clearance.   Therapeutic Exercises:  Dynamic LE activities- Hip march, knee ext and active Knee flex-  2 sets of 10 reps    PATIENT EDUCATION:  *extensive education on benefits of being in a higher sitting positon in [facility] for exercises, knee/back/hip comfort, donning/doffing prostheses, transfers.   *extensive problem solving for techiques to improve independence with self donning prosthese legs with  minimal physical burden on caregiver   HOME EXERCISE PROGRAM: -determination of a schedule at home for wearing of prosthese and shrinkers; recommended on 3x daily for prostheses with ad lib increase in time. -discussed pros and cons of shrink use/disuse overnight -torubleshooting and practicing self donning of pin in lock of legs   ASSESSMENT:  CLINICAL IMPRESSION: PT able to don both prosthesis better today. Patient was able to stand for the 1st time today- not erect today but was able to clear his bottom and heard a few more clicks in more weight bearing position. He was fatigued and unable to stand erect - most likely due to not standing in > 1 year.  He will benefit from skilled PT services to improve his overall LE strength, transfer ability, and possibly able to return to walking with prosthetic training for improved mobility and quality of life.    OBJECTIVE IMPAIRMENTS: decreased activity tolerance, decreased balance, decreased endurance, difficulty walking, decreased strength, and obesity.   ACTIVITY LIMITATIONS: carrying, lifting, bending, standing, squatting, stairs, transfers, bed mobility, bathing, toileting, and dressing  PARTICIPATION LIMITATIONS: cleaning, driving, shopping, community activity, occupation, and yard work  PERSONAL FACTORS: Time since onset of injury/illness/exacerbation and 3+ comorbidities: DM, COPD, PAD  are also affecting patient's functional outcome.   REHAB POTENTIAL: Good  CLINICAL DECISION MAKING: Evolving/moderate complexity  EVALUATION COMPLEXITY: Moderate   GOALS: Goals reviewed with patient? Yes  SHORT TERM GOALS: Target date: 01/09/2023  LONG TERM GOALS: Target date: 02/20/2023   1.  Patient will demonstrate modified independent with all bed mobility (sup<-> sit)  for optimal function at home  Baseline: EVAL - Will assess 2nd visit Goal status: INITIAL  2.  Patient will increase FOTO score to equal to or greater than  52 to  demonstrate statistically significant improvement in mobility and quality of life.  Baseline: EVAL = 41 Goal status: INITIAL   3.  Patient will demonstrate all slide board transfers with Min assist (Bed <-> chair)   Baseline: EVAL= Patient unable to transfer from chair to bed due to increased musle weakness Goal status: INITIAL       PLAN:  PT FREQUENCY: 1-2x/week  PT DURATION: 12 weeks  PLANNED INTERVENTIONS: Therapeutic exercises, Therapeutic activity, Neuromuscular re-education, Balance training, Gait training, Patient/Family education, Self Care, Joint mobilization, Joint manipulation, Vestibular training, Canalith repositioning, Prosthetic training, DME instructions, Dry Needling, Electrical stimulation, Wheelchair mobility training, Spinal manipulation, Spinal mobilization, Cryotherapy, Moist heat, Compression bandaging, scar mobilization, Manual therapy,  and Re-evaluation  PLAN FOR NEXT SESSION:  Continue to progress LE strengthening and therapeutic activities/prosthetic training.   5:20 PM, 12/17/22 Louis Meckel, PT Physical Therapist - [facility]  Outpatient Physical Therapy- Main Campus [phone]     Lenda Kelp, PT 12/17/2022, 5:20 PM

## 2022-12-20 ENCOUNTER — Ambulatory Visit: Payer: Medicare Other

## 2022-12-20 DIAGNOSIS — R2681 Unsteadiness on feet: Secondary | ICD-10-CM

## 2022-12-20 DIAGNOSIS — R278 Other lack of coordination: Secondary | ICD-10-CM

## 2022-12-20 DIAGNOSIS — R262 Difficulty in walking, not elsewhere classified: Secondary | ICD-10-CM

## 2022-12-20 DIAGNOSIS — R2689 Other abnormalities of gait and mobility: Secondary | ICD-10-CM

## 2022-12-20 DIAGNOSIS — M6281 Muscle weakness (generalized): Secondary | ICD-10-CM

## 2022-12-20 DIAGNOSIS — R269 Unspecified abnormalities of gait and mobility: Secondary | ICD-10-CM

## 2022-12-20 NOTE — Therapy (Signed)
OUTPATIENT PHYSICAL THERAPY TREATMENT   Patient Name: [name]. MRN: [id number] DOB:Aug 12, 1955, 67 y.o., male Today's Date: 12/20/2022  END OF SESSION:  PT End of Session - 12/20/22 1709     Visit Number 6    Number of Visits 24    Date for PT Re-Evaluation 02/20/23    Authorization Type [employer] Medicare    Progress Note Due on Visit 10    PT Start Time 1145    PT Stop Time 1230    PT Time Calculation (min) 45 min    Equipment Utilized During Treatment Gait belt    Activity Tolerance Patient tolerated treatment well;No increased pain;Patient limited by fatigue    Behavior During Therapy [facility] for tasks assessed/performed               Past Medical History:  Diagnosis Date   Arrhythmia    atrial fibrillation   Asthma    CHF (congestive heart failure) (HCC)    COPD (chronic obstructive pulmonary disease) (HCC)    Coronary artery disease    Depression    Diabetes mellitus without complication (HCC)    Gout    History anabolic steroid use    Hyperlipidemia    Hypertension    Hypogonadism in male    MI (myocardial infarction) (HCC)    Morbid obesity (HCC)    Myocardial infarction (HCC)    Peripheral vascular disease (HCC)    Perirectal abscess    Pleurisy    Sleep apnea    CPAP at night, no oxygen   Varicella    Past Surgical History:  Procedure Laterality Date   ABDOMINAL AORTIC ANEURYSM REPAIR     ACHILLES TENDON SURGERY Left 01/10/2021   Procedure: ACHILLES LENGTHENING/KIDNER;  Surgeon: Rosetta Posner, DPM;  Location: [facility];  Service: Podiatry;  Laterality: Left;   AMPUTATION Left 10/03/2021   Procedure: AMPUTATION BELOW KNEE;  Surgeon: Renford Dills, MD;  Location: [facility];  Service: Vascular;  Laterality: Left;   AMPUTATION Right 05/24/2022   Procedure: AMPUTATION BELOW KNEE;  Surgeon: Annice Needy, MD;  Location: [facility];  Service: General;  Laterality: Right;   AMPUTATION TOE Right 02/10/2016   Procedure: AMPUTATION TOE 3RD TOE;   Surgeon: Gwyneth Revels, DPM;  Location: [facility];  Service: Podiatry;  Laterality: Right;   AMPUTATION TOE Left 02/24/2020   Procedure: AMPUTATION TOE;  Surgeon: Rosetta Posner, DPM;  Location: [facility];  Service: Podiatry;  Laterality: Left;   APPLICATION OF WOUND VAC Left 02/29/2020   Procedure: APPLICATION OF WOUND VAC;  Surgeon: Rosetta Posner, DPM;  Location: [facility];  Service: Podiatry;  Laterality: Left;   CARDIAC CATHETERIZATION     CARDIOVERSION [name] 02/13/2022   Procedure: CARDIOVERSION;  Surgeon: Lamar Blinks, MD;  Location: [facility];  Service: Cardiovascular;  Laterality: [name];   COLONOSCOPY WITH PROPOFOL [name] 11/18/2015   Procedure: COLONOSCOPY WITH PROPOFOL;  Surgeon: Scot Jun, MD;  Location: [facility];  Service: Endoscopy;  Laterality: [name];   CORONARY ARTERY BYPASS GRAFT     CORONARY STENT INTERVENTION [name] 02/02/2020   Procedure: CORONARY STENT INTERVENTION;  Surgeon: Marcina Millard, MD;  Location: [facility];  Service: Cardiovascular;  Laterality: [name];   INCISION AND DRAINAGE Left 08/07/2021   Procedure: INCISION AND DRAINAGE-Partial Calcanectomy;  Surgeon: Rosetta Posner, DPM;  Location: [facility];  Service: Podiatry;  Laterality: Left;   IRRIGATION AND DEBRIDEMENT FOOT Left 02/29/2020   Procedure: IRRIGATION AND DEBRIDEMENT FOOT;  Surgeon: Rosetta Posner, DPM;  Location: [facility];  Service: Podiatry;  Laterality: Left;   IRRIGATION AND DEBRIDEMENT FOOT Left 02/24/2020   Procedure: IRRIGATION AND DEBRIDEMENT FOOT;  Surgeon: Rosetta Posner, DPM;  Location: [facility];  Service: Podiatry;  Laterality: Left;   KNEE ARTHROSCOPY     LEFT HEART CATH AND CORS/GRAFTS ANGIOGRAPHY [name] 02/02/2020   Procedure: LEFT HEART CATH AND CORS/GRAFTS ANGIOGRAPHY;  Surgeon: Dalia Heading, MD;  Location: [facility];  Service: Cardiovascular;  Laterality: [name];   LOWER EXTREMITY ANGIOGRAPHY Left 02/25/2020   Procedure: Lower Extremity Angiography;  Surgeon: Annice Needy, MD;  Location: [facility];  Service: Cardiovascular;  Laterality: Left;   LOWER EXTREMITY ANGIOGRAPHY Left 01/04/2021   Procedure: LOWER EXTREMITY ANGIOGRAPHY;  Surgeon: Annice Needy, MD;  Location: [facility];  Service: Cardiovascular;  Laterality: Left;   LOWER EXTREMITY ANGIOGRAPHY Right 05/21/2022   Procedure: Lower Extremity Angiography;  Surgeon: Annice Needy, MD;  Location: [facility];  Service: Cardiovascular;  Laterality: Right;   METATARSAL HEAD EXCISION Left 01/10/2021   Procedure: METATARSAL HEAD EXCISION - LEFT 5th;  Surgeon: Rosetta Posner, DPM;  Location: [facility];  Service: Podiatry;  Laterality: Left;   PERIPHERAL VASCULAR CATHETERIZATION Right 01/24/2016   Procedure: Lower Extremity Angiography;  Surgeon: Renford Dills, MD;  Location: [facility];  Service: Cardiovascular;  Laterality: Right;   PERIPHERAL VASCULAR CATHETERIZATION Right 01/25/2016   Procedure: Lower Extremity Angiography;  Surgeon: Renford Dills, MD;  Location: [facility];  Service: Cardiovascular;  Laterality: Right;   TOE AMPUTATION     TONSILLECTOMY     Patient Active Problem List   Diagnosis Date Noted   Status post below-knee amputation of right lower extremity (HCC) 05/31/2022   S/P amputation 05/30/2022   Poorly controlled diabetes mellitus (HCC) 05/30/2022   Acute osteomyelitis of right foot (HCC) 05/19/2022   Wound infection 05/18/2022   Ulcer of right ankle, with fat layer exposed (HCC) 05/18/2022   Non-healing wound of lower extremity, initial encounter 05/18/2022   CAD with Hx of CABG 05/18/2022   Chronic respiratory failure with hypoxia (HCC) 05/18/2022   S/P BKA (below knee amputation), left (HCC) 05/18/2022   COPD (chronic obstructive pulmonary disease) (HCC) 05/18/2022   Perinephric hematoma 03/20/2022   Bacteremia due to Klebsiella pneumoniae 03/19/2022   Hyperglycemia 03/16/2022   Septic shock (HCC) 03/16/2022   Sepsis  secondary to UTI (HCC) 03/15/2022   Acute hypoxemic respiratory failure (HCC) 02/13/2022   Atrial fibrillation with rapid ventricular response (HCC) 02/11/2022   Uncontrolled type 2 diabetes mellitus with hyperglycemia, with long-term current use of insulin (HCC) 02/11/2022   Dyslipidemia 02/11/2022   Hypothyroidism 02/11/2022   Benign prostatic hyperplasia with lower urinary tract symptoms 02/11/2022   Pressure injury of skin of heel 11/04/2021   UTI (urinary tract infection) 10/22/2021   Iron deficiency anemia 10/19/2021   Hyperkalemia 10/14/2021   Acute urinary retention 10/08/2021   Sepsis due to methicillin resistant Staphylococcus aureus (MRSA) (HCC) 10/04/2021   Acute delirium 10/04/2021   Acute on chronic respiratory failure with hypoxia and hypercapnia (HCC) 10/04/2021   Acute respiratory failure with hypoxia and hypercapnia (HCC) 10/03/2021   OSA (obstructive sleep apnea) 10/03/2021   Malnutrition of moderate degree 09/29/2021   Generalized weakness 09/29/2021   Cellulitis of left foot 08/04/2021   Uncontrolled type 2 diabetes mellitus with hypoglycemia, with long-term current use of insulin (HCC) 08/04/2021   Bilateral lower extremity edema 08/04/2021   Acute  osteomyelitis of lower leg, left (HCC) 08/04/2021   Swelling of limb 07/18/2021   CHF exacerbation (HCC) 05/08/2021   Diabetes mellitus without complication (HCC)    Atrial fibrillation with RVR (HCC)    Obesity, Class III, BMI 40-49.9 (morbid obesity) (HCC)    Venous stasis ulcer (HCC)    Sinus tachycardia    BPH (benign prostatic hyperplasia)    Chronic anticoagulation    Anasarca    Chronic diastolic CHF (congestive heart failure) (HCC)    Venous stasis of both lower extremities    AF (paroxysmal atrial fibrillation) (HCC)    Lower extremity cellulitis 01/03/2021   Chronic osteomyelitis of left foot (HCC) 01/03/2021   Osteomyelitis (HCC) 01/03/2021   Severe sepsis (HCC) 02/24/2020   AKI (acute kidney injury)  (HCC) 02/24/2020   Bilateral cellulitis of lower leg 02/23/2020   CAD (coronary artery disease) 02/02/2020   RLS (restless legs syndrome) 05/15/2019   Statin myopathy 05/15/2019   Diabetes mellitus type 2 in obese 02/13/2016   Essential hypertension 02/13/2016   PAD (peripheral artery disease) (HCC) 02/13/2016   Pressure injury of skin 01/25/2016   Hypoglycemia associated with diabetes (HCC) 12/27/2013   Long-term insulin use (HCC) 12/27/2013   Microalbuminuria 12/27/2013   B12 deficiency 09/29/2013   Obesity 09/29/2013   CAD (coronary artery disease), autologous vein bypass graft 07/20/2013   Hypersomnia with sleep apnea 07/20/2013   Pure hypercholesterolemia 07/20/2013   Osteomyelitis of ankle or foot 07/20/2011    PCP: Dr. Aram Beecham  REFERRING PROVIDER: Sheppard Plumber, NP  REFERRING DIAG: [id number] (ICD-10-CM) - Status post below-knee amputation of right lower extremity (HCC)   THERAPY DIAG:  Difficulty in walking, not elsewhere classified  Muscle weakness (generalized)  Other abnormalities of gait and mobility  Other lack of coordination  Abnormality of gait and mobility  Unsteadiness on feet  Rationale for Evaluation and Treatment: Rehabilitation  ONSET DATE: 05/24/2022 (date of surgery for latest Below knee amputation- RLE)   SUBJECTIVE:   SUBJECTIVE STATEMENT: Pt reports pleased this morning as he was able to get a few click into each prosthetic today.   PERTINENT HISTORY: Patient is a 67 year old male with CAD/PAF on Eliquis followed by Cardiology.. Obese with BMI>38. HTN stable on meds. Has HLD not on statin due to myopathy. Also with DM on insulin and thyroid dz on Synthroid. (See above section for all PMH) Patient reports he is eager to be here and a long road to recovery- Reports having his left leg amputated last year (10/03/2021) and most recently his right LE - both below the knee amputations. Reports very dependent with all mobility and states ready to  get stronger and on his feet. States just received his prosthesis yesterday.   Patient using [facility] for prosthetic care PAIN:  Are you having pain? No  PRECAUTIONS: Fall  RED FLAGS: None   WEIGHT BEARING RESTRICTIONS: No  FALLS:  Has patient fallen in last 6 months?  Will ask next visit  LIVING ENVIRONMENT: Lives with: lives with their spouse Lives in: House/apartment Stairs:  split level home - 2 step down to den- ramp for entry in home.  Has following equipment at home: Chiropractor, Environmental consultant - 2 wheeled, Wheelchair (manual), [employer], Ramped entry, and [employer], hoyer lift  OCCUPATION: Psychologist, occupational- retired  PLOF: Independent- prior to 10/03/2021 (left BKA)   PATIENT GOALS: "I want to walk with my prostheses"  NEXT MD VISIT: TBA  OBJECTIVE:   TODAY'S TREATMENT:  DATE: 12/20/22   Prosthetic training:  PT worked upon arrival to further don bilateral Below the knee prostheses- at least 2 more clicks each LE. Patient initally attempted slide board transfer from w/c to mat yet unable and subsequently dependent hoyer transfer to mat by PT and performed the following:  Therapeutic Exercises:  Supine quad sets-Hold 5 sec x 10 Supine gluteal sets- Hold 5 sec x 10 Supine heel slides- 15 reps each LE Supine SLR (PT holding heel of prosthetic leg) x 12 reps each Hooklye hip ER/IR (PT holding feet in place) x 12 reps Supine bridging (PT holding feet in place) x 12 reps Sidelye clamshell (15 reps each LE)  Supine Hip ext into towel roll - hold 5 sec x 10  Added all exercises to HEP and issued handout    PATIENT EDUCATION:  Exercise technique for HEP   HOME EXERCISE PROGRAM: Access Code: [email or web address] URL: [email or web address] Date: 12/20/2022 Prepared by: Maureen Ralphs  Exercises - Supine Quad Set (BKA)  - 1 x daily - 3 x  weekly - 3 sets - 10 reps - Sidelying Hip Abduction wtih Flexion and Extension (BKA)  - 1 x daily - 3 x weekly - 3 sets - 10 reps - Supine Single Leg Bridge with Sound Leg (BKA)  - 1 x daily - 3 x weekly - 3 sets - 10 reps - Supine Hip Extension with Towel Roll (BKA)  - 1 x daily - 3 x weekly - 3 sets - 10 reps - Hooklying Clamshell with Resistance  - 1 x daily - 3 x weekly - 3 sets - 10 reps    -determination of a schedule at home for wearing of prosthese and shrinkers; recommended on 3x daily for prostheses with ad lib increase in time. -discussed pros and cons of shrink use/disuse overnight -torubleshooting and practicing self donning of pin in lock of legs   ASSESSMENT:  CLINICAL IMPRESSION: Patient presented with good motivation for today's session. He was able to don his prostheses some (2-3 clicks at home on his own). Treatment today focused on instructing in some LE strengthening  that he could add to his HEP (with or without his prosthesis donned). He performed well with VC and visual demonstration today. He will benefit from skilled PT services to improve his overall LE strength, transfer ability, and possibly able to return to walking with prosthetic training for improved mobility and quality of life.    OBJECTIVE IMPAIRMENTS: decreased activity tolerance, decreased balance, decreased endurance, difficulty walking, decreased strength, and obesity.   ACTIVITY LIMITATIONS: carrying, lifting, bending, standing, squatting, stairs, transfers, bed mobility, bathing, toileting, and dressing  PARTICIPATION LIMITATIONS: cleaning, driving, shopping, community activity, occupation, and yard work  PERSONAL FACTORS: Time since onset of injury/illness/exacerbation and 3+ comorbidities: DM, COPD, PAD  are also affecting patient's functional outcome.   REHAB POTENTIAL: Good  CLINICAL DECISION MAKING: Evolving/moderate complexity  EVALUATION COMPLEXITY: Moderate   GOALS: Goals  reviewed with patient? Yes  SHORT TERM GOALS: Target date: 01/09/2023  LONG TERM GOALS: Target date: 02/20/2023   1.  Patient will demonstrate modified independent with all bed mobility (sup<-> sit)  for optimal function at home  Baseline: EVAL - Will assess 2nd visit Goal status: INITIAL  2.  Patient will increase FOTO score to equal to or greater than  52 to demonstrate statistically significant improvement in mobility and quality of life.  Baseline: EVAL = 41 Goal status: INITIAL   3.  Patient will demonstrate all slide  board transfers with Min assist (Bed <-> chair)   Baseline: EVAL= Patient unable to transfer from chair to bed due to increased musle weakness Goal status: INITIAL       PLAN:  PT FREQUENCY: 1-2x/week  PT DURATION: 12 weeks  PLANNED INTERVENTIONS: Therapeutic exercises, Therapeutic activity, Neuromuscular re-education, Balance training, Gait training, Patient/Family education, Self Care, Joint mobilization, Joint manipulation, Vestibular training, Canalith repositioning, Prosthetic training, DME instructions, Dry Needling, Electrical stimulation, Wheelchair mobility training, Spinal manipulation, Spinal mobilization, Cryotherapy, Moist heat, Compression bandaging, scar mobilization, Manual therapy, and Re-evaluation  PLAN FOR NEXT SESSION:  Continue to progress LE strengthening and therapeutic activities/prosthetic training.   5:30 PM, 12/20/22 Louis Meckel, PT Physical Therapist - [facility]  [facility] [phone]     Lenda Kelp, PT 12/20/2022, 5:30 PM

## 2022-12-24 ENCOUNTER — Ambulatory Visit: Payer: Medicare Other

## 2022-12-24 DIAGNOSIS — R269 Unspecified abnormalities of gait and mobility: Secondary | ICD-10-CM

## 2022-12-24 DIAGNOSIS — R2681 Unsteadiness on feet: Secondary | ICD-10-CM

## 2022-12-24 DIAGNOSIS — R262 Difficulty in walking, not elsewhere classified: Secondary | ICD-10-CM

## 2022-12-24 DIAGNOSIS — R2689 Other abnormalities of gait and mobility: Secondary | ICD-10-CM

## 2022-12-24 DIAGNOSIS — R278 Other lack of coordination: Secondary | ICD-10-CM

## 2022-12-24 DIAGNOSIS — M6281 Muscle weakness (generalized): Secondary | ICD-10-CM

## 2022-12-24 NOTE — Therapy (Signed)
OUTPATIENT PHYSICAL THERAPY TREATMENT   Patient Name: [name]. MRN: [id number] DOB:Sep 28, 1955, 67 y.o., male Today's Date: 12/24/2022  END OF SESSION:  PT End of Session - 12/24/22 1403     Visit Number 7    Number of Visits 24    Date for PT Re-Evaluation 02/20/23    Authorization Type [employer]BS Medicare    Progress Note Due on Visit 10    PT Start Time 1400    PT Stop Time 1445    PT Time Calculation (min) 45 min    Equipment Utilized During Treatment Gait belt    Activity Tolerance Patient tolerated treatment well;No increased pain;Patient limited by fatigue    Behavior During Therapy [facility] for tasks assessed/performed               Past Medical History:  Diagnosis Date   Arrhythmia    atrial fibrillation   Asthma    CHF (congestive heart failure) (HCC)    COPD (chronic obstructive pulmonary disease) (HCC)    Coronary artery disease    Depression    Diabetes mellitus without complication (HCC)    Gout    History anabolic steroid use    Hyperlipidemia    Hypertension    Hypogonadism in male    MI (myocardial infarction) (HCC)    Morbid obesity (HCC)    Myocardial infarction (HCC)    Peripheral vascular disease (HCC)    Perirectal abscess    Pleurisy    Sleep apnea    CPAP at night, no oxygen   Varicella    Past Surgical History:  Procedure Laterality Date   ABDOMINAL AORTIC ANEURYSM REPAIR     ACHILLES TENDON SURGERY Left 01/10/2021   Procedure: ACHILLES LENGTHENING/KIDNER;  Surgeon: Rosetta Posner, DPM;  Location: [facility];  Service: Podiatry;  Laterality: Left;   AMPUTATION Left 10/03/2021   Procedure: AMPUTATION BELOW KNEE;  Surgeon: Renford Dills, MD;  Location: [facility];  Service: Vascular;  Laterality: Left;   AMPUTATION Right 05/24/2022   Procedure: AMPUTATION BELOW KNEE;  Surgeon: Annice Needy, MD;  Location: [facility];  Service: General;  Laterality: Right;   AMPUTATION TOE Right 02/10/2016   Procedure: AMPUTATION TOE 3RD TOE;   Surgeon: Gwyneth Revels, DPM;  Location: [facility];  Service: Podiatry;  Laterality: Right;   AMPUTATION TOE Left 02/24/2020   Procedure: AMPUTATION TOE;  Surgeon: Rosetta Posner, DPM;  Location: [facility];  Service: Podiatry;  Laterality: Left;   APPLICATION OF WOUND VAC Left 02/29/2020   Procedure: APPLICATION OF WOUND VAC;  Surgeon: Rosetta Posner, DPM;  Location: [facility];  Service: Podiatry;  Laterality: Left;   CARDIAC CATHETERIZATION     CARDIOVERSION [name] 02/13/2022   Procedure: CARDIOVERSION;  Surgeon: Lamar Blinks, MD;  Location: [facility];  Service: Cardiovascular;  Laterality: [name];   COLONOSCOPY WITH PROPOFOL [name] 11/18/2015   Procedure: COLONOSCOPY WITH PROPOFOL;  Surgeon: Scot Jun, MD;  Location: [facility];  Service: Endoscopy;  Laterality: [name];   CORONARY ARTERY BYPASS GRAFT     CORONARY STENT INTERVENTION [name] 02/02/2020   Procedure: CORONARY STENT INTERVENTION;  Surgeon: Marcina Millard, MD;  Location: [facility];  Service: Cardiovascular;  Laterality: [name];   INCISION AND DRAINAGE Left 08/07/2021   Procedure: INCISION AND DRAINAGE-Partial Calcanectomy;  Surgeon: Rosetta Posner, DPM;  Location: [facility];  Service: Podiatry;  Laterality: Left;   IRRIGATION AND DEBRIDEMENT FOOT Left 02/29/2020   Procedure: IRRIGATION AND DEBRIDEMENT FOOT;  Surgeon: Rosetta Posner, DPM;  Location: [facility];  Service: Podiatry;  Laterality: Left;   IRRIGATION AND DEBRIDEMENT FOOT Left 02/24/2020   Procedure: IRRIGATION AND DEBRIDEMENT FOOT;  Surgeon: Rosetta Posner, DPM;  Location: [facility];  Service: Podiatry;  Laterality: Left;   KNEE ARTHROSCOPY     LEFT HEART CATH AND CORS/GRAFTS ANGIOGRAPHY [name] 02/02/2020   Procedure: LEFT HEART CATH AND CORS/GRAFTS ANGIOGRAPHY;  Surgeon: Dalia Heading, MD;  Location: [facility];  Service: Cardiovascular;  Laterality: [name];   LOWER EXTREMITY ANGIOGRAPHY Left 02/25/2020   Procedure: Lower Extremity Angiography;  Surgeon: Annice Needy, MD;  Location: [facility];  Service: Cardiovascular;  Laterality: Left;   LOWER EXTREMITY ANGIOGRAPHY Left 01/04/2021   Procedure: LOWER EXTREMITY ANGIOGRAPHY;  Surgeon: Annice Needy, MD;  Location: [facility];  Service: Cardiovascular;  Laterality: Left;   LOWER EXTREMITY ANGIOGRAPHY Right 05/21/2022   Procedure: Lower Extremity Angiography;  Surgeon: Annice Needy, MD;  Location: [facility];  Service: Cardiovascular;  Laterality: Right;   METATARSAL HEAD EXCISION Left 01/10/2021   Procedure: METATARSAL HEAD EXCISION - LEFT 5th;  Surgeon: Rosetta Posner, DPM;  Location: [facility];  Service: Podiatry;  Laterality: Left;   PERIPHERAL VASCULAR CATHETERIZATION Right 01/24/2016   Procedure: Lower Extremity Angiography;  Surgeon: Renford Dills, MD;  Location: [facility];  Service: Cardiovascular;  Laterality: Right;   PERIPHERAL VASCULAR CATHETERIZATION Right 01/25/2016   Procedure: Lower Extremity Angiography;  Surgeon: Renford Dills, MD;  Location: [facility];  Service: Cardiovascular;  Laterality: Right;   TOE AMPUTATION     TONSILLECTOMY     Patient Active Problem List   Diagnosis Date Noted   Status post below-knee amputation of right lower extremity (HCC) 05/31/2022   S/P amputation 05/30/2022   Poorly controlled diabetes mellitus (HCC) 05/30/2022   Acute osteomyelitis of right foot (HCC) 05/19/2022   Wound infection 05/18/2022   Ulcer of right ankle, with fat layer exposed (HCC) 05/18/2022   Non-healing wound of lower extremity, initial encounter 05/18/2022   CAD with Hx of CABG 05/18/2022   Chronic respiratory failure with hypoxia (HCC) 05/18/2022   S/P BKA (below knee amputation), left (HCC) 05/18/2022   COPD (chronic obstructive pulmonary disease) (HCC) 05/18/2022   Perinephric hematoma 03/20/2022   Bacteremia due to Klebsiella pneumoniae 03/19/2022   Hyperglycemia 03/16/2022   Septic shock (HCC) 03/16/2022   Sepsis  secondary to UTI (HCC) 03/15/2022   Acute hypoxemic respiratory failure (HCC) 02/13/2022   Atrial fibrillation with rapid ventricular response (HCC) 02/11/2022   Uncontrolled type 2 diabetes mellitus with hyperglycemia, with long-term current use of insulin (HCC) 02/11/2022   Dyslipidemia 02/11/2022   Hypothyroidism 02/11/2022   Benign prostatic hyperplasia with lower urinary tract symptoms 02/11/2022   Pressure injury of skin of heel 11/04/2021   UTI (urinary tract infection) 10/22/2021   Iron deficiency anemia 10/19/2021   Hyperkalemia 10/14/2021   Acute urinary retention 10/08/2021   Sepsis due to methicillin resistant Staphylococcus aureus (MRSA) (HCC) 10/04/2021   Acute delirium 10/04/2021   Acute on chronic respiratory failure with hypoxia and hypercapnia (HCC) 10/04/2021   Acute respiratory failure with hypoxia and hypercapnia (HCC) 10/03/2021   OSA (obstructive sleep apnea) 10/03/2021   Malnutrition of moderate degree 09/29/2021   Generalized weakness 09/29/2021   Cellulitis of left foot 08/04/2021   Uncontrolled type 2 diabetes mellitus with hypoglycemia, with long-term current use of insulin (HCC) 08/04/2021   Bilateral lower extremity edema 08/04/2021   Acute  osteomyelitis of lower leg, left (HCC) 08/04/2021   Swelling of limb 07/18/2021   CHF exacerbation (HCC) 05/08/2021   Diabetes mellitus without complication (HCC)    Atrial fibrillation with RVR (HCC)    Obesity, Class III, BMI 40-49.9 (morbid obesity) (HCC)    Venous stasis ulcer (HCC)    Sinus tachycardia    BPH (benign prostatic hyperplasia)    Chronic anticoagulation    Anasarca    Chronic diastolic CHF (congestive heart failure) (HCC)    Venous stasis of both lower extremities    AF (paroxysmal atrial fibrillation) (HCC)    Lower extremity cellulitis 01/03/2021   Chronic osteomyelitis of left foot (HCC) 01/03/2021   Osteomyelitis (HCC) 01/03/2021   Severe sepsis (HCC) 02/24/2020   AKI (acute kidney injury)  (HCC) 02/24/2020   Bilateral cellulitis of lower leg 02/23/2020   CAD (coronary artery disease) 02/02/2020   RLS (restless legs syndrome) 05/15/2019   Statin myopathy 05/15/2019   Diabetes mellitus type 2 in obese 02/13/2016   Essential hypertension 02/13/2016   PAD (peripheral artery disease) (HCC) 02/13/2016   Pressure injury of skin 01/25/2016   Hypoglycemia associated with diabetes (HCC) 12/27/2013   Long-term insulin use (HCC) 12/27/2013   Microalbuminuria 12/27/2013   B12 deficiency 09/29/2013   Obesity 09/29/2013   CAD (coronary artery disease), autologous vein bypass graft 07/20/2013   Hypersomnia with sleep apnea 07/20/2013   Pure hypercholesterolemia 07/20/2013   Osteomyelitis of ankle or foot 07/20/2011    PCP: Dr. Aram Beecham  REFERRING PROVIDER: Sheppard Plumber, NP  REFERRING DIAG: [phone] (ICD-10-CM) - Status post below-knee amputation of right lower extremity (HCC)   THERAPY DIAG:  Difficulty in walking, not elsewhere classified  Muscle weakness (generalized)  Other abnormalities of gait and mobility  Other lack of coordination  Abnormality of gait and mobility  Unsteadiness on feet  Rationale for Evaluation and Treatment: Rehabilitation  ONSET DATE: 05/24/2022 (date of surgery for latest Below knee amputation- RLE)   SUBJECTIVE:   SUBJECTIVE STATEMENT: Pt reports he was again able to don his prostheses with at least 2 clicks each today. He denies any falls or pain.    PERTINENT HISTORY: Patient is a 67 year old male with CAD/PAF on Eliquis followed by Cardiology.. Obese with BMI>38. HTN stable on meds. Has HLD not on statin due to myopathy. Also with DM on insulin and thyroid dz on Synthroid. (See above section for all PMH) Patient reports he is eager to be here and a long road to recovery- Reports having his left leg amputated last year (10/03/2021) and most recently his right LE - both below the knee amputations. Reports very dependent with all mobility  and states ready to get stronger and on his feet. States just received his prosthesis yesterday.   Patient using [facility] for prosthetic care PAIN:  Are you having pain? No  PRECAUTIONS: Fall  RED FLAGS: None   WEIGHT BEARING RESTRICTIONS: No  FALLS:  Has patient fallen in last 6 months?  Will ask next visit  LIVING ENVIRONMENT: Lives with: lives with their spouse Lives in: House/apartment Stairs:  split level home - 2 step down to den- ramp for entry in home.  Has following equipment at home: Chiropractor, Environmental consultant - 2 wheeled, Wheelchair (manual), [employer], Ramped entry, and [employer], hoyer lift  OCCUPATION: Psychologist, occupational- retired  PLOF: Independent- prior to 10/03/2021 (left BKA)   PATIENT GOALS: "I want to walk with my prostheses"  NEXT MD VISIT: TBA  OBJECTIVE:  TODAY'S TREATMENT:                                                                                                                              DATE: 12/24/22   Prosthetic training:  PT worked upon arrival to further don bilateral Below the knee prostheses- Patient reported getting 2 clicks each and PT able to get 3 more clicks on left and 2 more on Right.   *Patient dependently hoyer lifted from manual w/c to sitting position at edge of mat to perform the following:  Therapeutic activities:  Static sitting focusing flexing both knees and placing heels of prosthesis on floor Dynamic reaching with each UE in varying positions- up/down- lateral and reaching with ant/lateral trunk shifting x several min each UE.  Placed yoga blocks (1 on each side of patient) - shoulder depression- butt lifts x 6 - stopped secondary to fatigue.  Seated hip march and knee ext each LE 2 sets of 10 reps each.  Seated resistive scap retraction with RTB 2 sets of 10 reps Seated resistive horizontal ABD with RTB 2 sets of 10 reps Seated tricep press down with RTB - x 12 reps      PATIENT EDUCATION:  Exercise technique for  HEP   HOME EXERCISE PROGRAM: Access Code: [email or web address] URL: [email or web address] Date: 12/20/2022 Prepared by: Maureen Ralphs  Exercises - Supine Quad Set (BKA)  - 1 x daily - 3 x weekly - 3 sets - 10 reps - Sidelying Hip Abduction wtih Flexion and Extension (BKA)  - 1 x daily - 3 x weekly - 3 sets - 10 reps - Supine Single Leg Bridge with Sound Leg (BKA)  - 1 x daily - 3 x weekly - 3 sets - 10 reps - Supine Hip Extension with Towel Roll (BKA)  - 1 x daily - 3 x weekly - 3 sets - 10 reps - Hooklying Clamshell with Resistance  - 1 x daily - 3 x weekly - 3 sets - 10 reps    -determination of a schedule at home for wearing of prosthese and shrinkers; recommended on 3x daily for prostheses with ad lib increase in time. -discussed pros and cons of shrink use/disuse overnight -torubleshooting and practicing self donning of pin in lock of legs   ASSESSMENT:  CLINICAL IMPRESSION: Patient continues to present with good motivation and excited that putting on his legs has become a little easier- still very challenging but hopeful. He presents with some UE muscle weakness and will benefit from training in future sessions. He was anxious some with dynamic sitting yet no LOB.  He will benefit from skilled PT services to improve his overall LE strength, transfer ability, and possibly able to return to walking with prosthetic training for improved mobility and quality of life.    OBJECTIVE IMPAIRMENTS: decreased activity tolerance, decreased balance, decreased endurance, difficulty walking, decreased strength, and obesity.   ACTIVITY LIMITATIONS: carrying, lifting, bending, standing, squatting, stairs,  transfers, bed mobility, bathing, toileting, and dressing  PARTICIPATION LIMITATIONS: cleaning, driving, shopping, community activity, occupation, and yard work  PERSONAL FACTORS: Time since onset of injury/illness/exacerbation and 3+ comorbidities: DM, COPD, PAD  are also  affecting patient's functional outcome.   REHAB POTENTIAL: Good  CLINICAL DECISION MAKING: Evolving/moderate complexity  EVALUATION COMPLEXITY: Moderate   GOALS: Goals reviewed with patient? Yes  SHORT TERM GOALS: Target date: 01/09/2023  LONG TERM GOALS: Target date: 02/20/2023   1.  Patient will demonstrate modified independent with all bed mobility (sup<-> sit)  for optimal function at home  Baseline: EVAL - Will assess 2nd visit Goal status: INITIAL  2.  Patient will increase FOTO score to equal to or greater than  52 to demonstrate statistically significant improvement in mobility and quality of life.  Baseline: EVAL = 41 Goal status: INITIAL   3.  Patient will demonstrate all slide board transfers with Min assist (Bed <-> chair)   Baseline: EVAL= Patient unable to transfer from chair to bed due to increased musle weakness Goal status: INITIAL       PLAN:  PT FREQUENCY: 1-2x/week  PT DURATION: 12 weeks  PLANNED INTERVENTIONS: Therapeutic exercises, Therapeutic activity, Neuromuscular re-education, Balance training, Gait training, Patient/Family education, Self Care, Joint mobilization, Joint manipulation, Vestibular training, Canalith repositioning, Prosthetic training, DME instructions, Dry Needling, Electrical stimulation, Wheelchair mobility training, Spinal manipulation, Spinal mobilization, Cryotherapy, Moist heat, Compression bandaging, scar mobilization, Manual therapy, and Re-evaluation  PLAN FOR NEXT SESSION:  Continue to progress LE strengthening and therapeutic activities/prosthetic training.   3:22 PM, 12/24/22 Louis Meckel, PT Physical Therapist - [facility]  Outpatient Physical Therapy- Main Campus [phone]     Lenda Kelp, PT 12/24/2022, 3:22 PM

## 2022-12-27 ENCOUNTER — Ambulatory Visit: Payer: Medicare Other

## 2022-12-27 ENCOUNTER — Other Ambulatory Visit: Payer: Self-pay | Admitting: Neurology

## 2022-12-27 DIAGNOSIS — M6281 Muscle weakness (generalized): Secondary | ICD-10-CM

## 2022-12-27 DIAGNOSIS — R262 Difficulty in walking, not elsewhere classified: Secondary | ICD-10-CM | POA: Diagnosis not present

## 2022-12-27 DIAGNOSIS — R2689 Other abnormalities of gait and mobility: Secondary | ICD-10-CM

## 2022-12-27 DIAGNOSIS — R251 Tremor, unspecified: Secondary | ICD-10-CM

## 2022-12-27 DIAGNOSIS — R269 Unspecified abnormalities of gait and mobility: Secondary | ICD-10-CM

## 2022-12-27 DIAGNOSIS — R278 Other lack of coordination: Secondary | ICD-10-CM

## 2022-12-27 NOTE — Therapy (Signed)
OUTPATIENT PHYSICAL THERAPY TREATMENT   Patient Name: [name]. MRN: [id number] DOB:Aug 25, 1955, 67 y.o., male Today's Date: 12/27/2022  END OF SESSION:  PT End of Session - 12/27/22 1302     Visit Number 8    Number of Visits 24    Date for PT Re-Evaluation 02/20/23    Authorization Type BCBS Medicare    Progress Note Due on Visit 10    PT Start Time 1145    PT Stop Time 1228    PT Time Calculation (min) 43 min    Equipment Utilized During Treatment Gait belt    Activity Tolerance Patient tolerated treatment well;No increased pain;Patient limited by fatigue    Behavior During Therapy [facility] for tasks assessed/performed                Past Medical History:  Diagnosis Date   Arrhythmia    atrial fibrillation   Asthma    CHF (congestive heart failure) (HCC)    COPD (chronic obstructive pulmonary disease) (HCC)    Coronary artery disease    Depression    Diabetes mellitus without complication (HCC)    Gout    History anabolic steroid use    Hyperlipidemia    Hypertension    Hypogonadism in male    MI (myocardial infarction) (HCC)    Morbid obesity (HCC)    Myocardial infarction (HCC)    Peripheral vascular disease (HCC)    Perirectal abscess    Pleurisy    Sleep apnea    CPAP at night, no oxygen   Varicella    Past Surgical History:  Procedure Laterality Date   ABDOMINAL AORTIC ANEURYSM REPAIR     ACHILLES TENDON SURGERY Left 01/10/2021   Procedure: ACHILLES LENGTHENING/KIDNER;  Surgeon: Rosetta Posner, DPM;  Location: [facility];  Service: Podiatry;  Laterality: Left;   AMPUTATION Left 10/03/2021   Procedure: AMPUTATION BELOW KNEE;  Surgeon: Renford Dills, MD;  Location: [facility];  Service: Vascular;  Laterality: Left;   AMPUTATION Right 05/24/2022   Procedure: AMPUTATION BELOW KNEE;  Surgeon: Annice Needy, MD;  Location: [facility];  Service: General;  Laterality: Right;   AMPUTATION TOE Right 02/10/2016   Procedure: AMPUTATION TOE 3RD TOE;   Surgeon: Gwyneth Revels, DPM;  Location: [facility];  Service: Podiatry;  Laterality: Right;   AMPUTATION TOE Left 02/24/2020   Procedure: AMPUTATION TOE;  Surgeon: Rosetta Posner, DPM;  Location: [facility];  Service: Podiatry;  Laterality: Left;   APPLICATION OF WOUND VAC Left 02/29/2020   Procedure: APPLICATION OF WOUND VAC;  Surgeon: Rosetta Posner, DPM;  Location: [facility];  Service: Podiatry;  Laterality: Left;   CARDIAC CATHETERIZATION     CARDIOVERSION [name] 02/13/2022   Procedure: CARDIOVERSION;  Surgeon: Lamar Blinks, MD;  Location: [facility];  Service: Cardiovascular;  Laterality: [name];   COLONOSCOPY WITH PROPOFOL [name] 11/18/2015   Procedure: COLONOSCOPY WITH PROPOFOL;  Surgeon: Scot Jun, MD;  Location: [facility];  Service: Endoscopy;  Laterality: [name];   CORONARY ARTERY BYPASS GRAFT     CORONARY STENT INTERVENTION [name] 02/02/2020   Procedure: CORONARY STENT INTERVENTION;  Surgeon: Marcina Millard, MD;  Location: [facility];  Service: Cardiovascular;  Laterality: [name];   INCISION AND DRAINAGE Left 08/07/2021   Procedure: INCISION AND DRAINAGE-Partial Calcanectomy;  Surgeon: Rosetta Posner, DPM;  Location: [facility];  Service: Podiatry;  Laterality: Left;   IRRIGATION AND DEBRIDEMENT FOOT Left 02/29/2020   Procedure: IRRIGATION AND DEBRIDEMENT FOOT;  Surgeon: Rosetta Posner,  DPM;  Location: [facility];  Service: Podiatry;  Laterality: Left;   IRRIGATION AND DEBRIDEMENT FOOT Left 02/24/2020   Procedure: IRRIGATION AND DEBRIDEMENT FOOT;  Surgeon: Rosetta Posner, DPM;  Location: [facility];  Service: Podiatry;  Laterality: Left;   KNEE ARTHROSCOPY     LEFT HEART CATH AND CORS/GRAFTS ANGIOGRAPHY [name] 02/02/2020   Procedure: LEFT HEART CATH AND CORS/GRAFTS ANGIOGRAPHY;  Surgeon: Dalia Heading, MD;  Location: [facility];  Service: Cardiovascular;  Laterality: [name];   LOWER EXTREMITY ANGIOGRAPHY Left 02/25/2020   Procedure: Lower Extremity Angiography;  Surgeon: Annice Needy, MD;  Location: [facility];  Service: Cardiovascular;  Laterality: Left;   LOWER EXTREMITY ANGIOGRAPHY Left 01/04/2021   Procedure: LOWER EXTREMITY ANGIOGRAPHY;  Surgeon: Annice Needy, MD;  Location: [facility];  Service: Cardiovascular;  Laterality: Left;   LOWER EXTREMITY ANGIOGRAPHY Right 05/21/2022   Procedure: Lower Extremity Angiography;  Surgeon: Annice Needy, MD;  Location: [facility];  Service: Cardiovascular;  Laterality: Right;   METATARSAL HEAD EXCISION Left 01/10/2021   Procedure: METATARSAL HEAD EXCISION - LEFT 5th;  Surgeon: Rosetta Posner, DPM;  Location: [facility];  Service: Podiatry;  Laterality: Left;   PERIPHERAL VASCULAR CATHETERIZATION Right 01/24/2016   Procedure: Lower Extremity Angiography;  Surgeon: Renford Dills, MD;  Location: [facility];  Service: Cardiovascular;  Laterality: Right;   PERIPHERAL VASCULAR CATHETERIZATION Right 01/25/2016   Procedure: Lower Extremity Angiography;  Surgeon: Renford Dills, MD;  Location: [facility];  Service: Cardiovascular;  Laterality: Right;   TOE AMPUTATION     TONSILLECTOMY     Patient Active Problem List   Diagnosis Date Noted   Status post below-knee amputation of right lower extremity (HCC) 05/31/2022   S/P amputation 05/30/2022   Poorly controlled diabetes mellitus (HCC) 05/30/2022   Acute osteomyelitis of right foot (HCC) 05/19/2022   Wound infection 05/18/2022   Ulcer of right ankle, with fat layer exposed (HCC) 05/18/2022   Non-healing wound of lower extremity, initial encounter 05/18/2022   CAD with Hx of CABG 05/18/2022   Chronic respiratory failure with hypoxia (HCC) 05/18/2022   S/P BKA (below knee amputation), left (HCC) 05/18/2022   COPD (chronic obstructive pulmonary disease) (HCC) 05/18/2022   Perinephric hematoma 03/20/2022   Bacteremia due to Klebsiella pneumoniae 03/19/2022   Hyperglycemia 03/16/2022   Septic shock (HCC) 03/16/2022   Sepsis  secondary to UTI (HCC) 03/15/2022   Acute hypoxemic respiratory failure (HCC) 02/13/2022   Atrial fibrillation with rapid ventricular response (HCC) 02/11/2022   Uncontrolled type 2 diabetes mellitus with hyperglycemia, with long-term current use of insulin (HCC) 02/11/2022   Dyslipidemia 02/11/2022   Hypothyroidism 02/11/2022   Benign prostatic hyperplasia with lower urinary tract symptoms 02/11/2022   Pressure injury of skin of heel 11/04/2021   UTI (urinary tract infection) 10/22/2021   Iron deficiency anemia 10/19/2021   Hyperkalemia 10/14/2021   Acute urinary retention 10/08/2021   Sepsis due to methicillin resistant Staphylococcus aureus (MRSA) (HCC) 10/04/2021   Acute delirium 10/04/2021   Acute on chronic respiratory failure with hypoxia and hypercapnia (HCC) 10/04/2021   Acute respiratory failure with hypoxia and hypercapnia (HCC) 10/03/2021   OSA (obstructive sleep apnea) 10/03/2021   Malnutrition of moderate degree 09/29/2021   Generalized weakness 09/29/2021   Cellulitis of left foot 08/04/2021   Uncontrolled type 2 diabetes mellitus with hypoglycemia, with long-term current use of insulin (HCC) 08/04/2021   Bilateral lower extremity edema 08/04/2021  Acute osteomyelitis of lower leg, left (HCC) 08/04/2021   Swelling of limb 07/18/2021   CHF exacerbation (HCC) 05/08/2021   Diabetes mellitus without complication (HCC)    Atrial fibrillation with RVR (HCC)    Obesity, Class III, BMI 40-49.9 (morbid obesity) (HCC)    Venous stasis ulcer (HCC)    Sinus tachycardia    BPH (benign prostatic hyperplasia)    Chronic anticoagulation    Anasarca    Chronic diastolic CHF (congestive heart failure) (HCC)    Venous stasis of both lower extremities    AF (paroxysmal atrial fibrillation) (HCC)    Lower extremity cellulitis 01/03/2021   Chronic osteomyelitis of left foot (HCC) 01/03/2021   Osteomyelitis (HCC) 01/03/2021   Severe sepsis (HCC) 02/24/2020   AKI (acute kidney injury)  (HCC) 02/24/2020   Bilateral cellulitis of lower leg 02/23/2020   CAD (coronary artery disease) 02/02/2020   RLS (restless legs syndrome) 05/15/2019   Statin myopathy 05/15/2019   Diabetes mellitus type 2 in obese 02/13/2016   Essential hypertension 02/13/2016   PAD (peripheral artery disease) (HCC) 02/13/2016   Pressure injury of skin 01/25/2016   Hypoglycemia associated with diabetes (HCC) 12/27/2013   Long-term insulin use (HCC) 12/27/2013   Microalbuminuria 12/27/2013   B12 deficiency 09/29/2013   Obesity 09/29/2013   CAD (coronary artery disease), autologous vein bypass graft 07/20/2013   Hypersomnia with sleep apnea 07/20/2013   Pure hypercholesterolemia 07/20/2013   Osteomyelitis of ankle or foot 07/20/2011    PCP: Dr. Aram Beecham  REFERRING PROVIDER: Sheppard Plumber, NP  REFERRING DIAG: [phone] (ICD-10-CM) - Status post below-knee amputation of right lower extremity (HCC)   THERAPY DIAG:  Difficulty in walking, not elsewhere classified  Muscle weakness (generalized)  Other abnormalities of gait and mobility  Other lack of coordination  Abnormality of gait and mobility  Rationale for Evaluation and Treatment: Rehabilitation  ONSET DATE: 05/24/2022 (date of surgery for latest Below knee amputation- RLE)   SUBJECTIVE:   SUBJECTIVE STATEMENT: Pt reports about the same prostheses with at least 2 clicks each today. States compliant with use of shrinker.   PERTINENT HISTORY: Patient is a 67 year old male with CAD/PAF on Eliquis followed by Cardiology.. Obese with BMI>38. HTN stable on meds. Has HLD not on statin due to myopathy. Also with DM on insulin and thyroid dz on Synthroid. (See above section for all PMH) Patient reports he is eager to be here and a long road to recovery- Reports having his left leg amputated last year (10/03/2021) and most recently his right LE - both below the knee amputations. Reports very dependent with all mobility and states ready to get  stronger and on his feet. States just received his prosthesis yesterday.   Patient using [facility] for prosthetic care PAIN:  Are you having pain? No  PRECAUTIONS: Fall  RED FLAGS: None   WEIGHT BEARING RESTRICTIONS: No  FALLS:  Has patient fallen in last 6 months?  Will ask next visit  LIVING ENVIRONMENT: Lives with: lives with their spouse Lives in: House/apartment Stairs:  split level home - 2 step down to den- ramp for entry in home.  Has following equipment at home: Chiropractor, Environmental consultant - 2 wheeled, Wheelchair (manual), [employer], Ramped entry, and [employer], hoyer lift  OCCUPATION: Psychologist, occupational- retired  PLOF: Independent- prior to 10/03/2021 (left BKA)   PATIENT GOALS: "I want to walk with my prostheses"  NEXT MD VISIT: TBA  OBJECTIVE:   TODAY'S TREATMENT:  DATE: 12/27/22   Prosthetic training:  PT worked upon arrival to further don bilateral Below the knee prostheses- Patient reported getting 2 clicks each and PT able to get 3 more clicks on left and 3 more on Right.   *Patient dependently hoyer lifted from manual w/c to sitting position at edge of mat to perform the following:  Therapeutic activities:  Sit to stand - using [place] sit to stand lift x 5 trials - VC to tighten quads and glutes.  Static stand - using Rozell Searing sit to stand lift- 5 trials ranging from 1 min 30 sec to 4 min each trial- focusing on standing erect and tightening quads and glutes    PATIENT EDUCATION:  Exercise technique for HEP   HOME EXERCISE PROGRAM: Access Code: [email or web address] URL: [email or web address] Date: 12/20/2022 Prepared by: Maureen Ralphs  Exercises - Supine Quad Set (BKA)  - 1 x daily - 3 x weekly - 3 sets - 10 reps - Sidelying Hip Abduction wtih Flexion and Extension (BKA)  - 1 x daily - 3 x weekly - 3 sets - 10 reps - Supine  Single Leg Bridge with Sound Leg (BKA)  - 1 x daily - 3 x weekly - 3 sets - 10 reps - Supine Hip Extension with Towel Roll (BKA)  - 1 x daily - 3 x weekly - 3 sets - 10 reps - Hooklying Clamshell with Resistance  - 1 x daily - 3 x weekly - 3 sets - 10 reps    -determination of a schedule at home for wearing of prosthese and shrinkers; recommended on 3x daily for prostheses with ad lib increase in time. -discussed pros and cons of shrink use/disuse overnight -torubleshooting and practicing self donning of pin in lock of legs   ASSESSMENT:  CLINICAL IMPRESSION: Patient excited for today's visit and eager to try to stand. Donned prosthesis (patient) on his own with 2 clicks and PT assisted with 3 more clicks. He was able to stand using sit to stand lift today and able to focus on his core, weightbearing through his legs and erect posture. He was able to stand In the first time over the year and well motivated throughout session.  He will benefit from skilled PT services to improve his overall LE strength, transfer ability, and possibly able to return to walking with prosthetic training for improved mobility and quality of life.    OBJECTIVE IMPAIRMENTS: decreased activity tolerance, decreased balance, decreased endurance, difficulty walking, decreased strength, and obesity.   ACTIVITY LIMITATIONS: carrying, lifting, bending, standing, squatting, stairs, transfers, bed mobility, bathing, toileting, and dressing  PARTICIPATION LIMITATIONS: cleaning, driving, shopping, community activity, occupation, and yard work  PERSONAL FACTORS: Time since onset of injury/illness/exacerbation and 3+ comorbidities: DM, COPD, PAD  are also affecting patient's functional outcome.   REHAB POTENTIAL: Good  CLINICAL DECISION MAKING: Evolving/moderate complexity  EVALUATION COMPLEXITY: Moderate   GOALS: Goals reviewed with patient? Yes  SHORT TERM GOALS: Target date: 01/09/2023  LONG TERM GOALS: Target  date: 02/20/2023   1.  Patient will demonstrate modified independent with all bed mobility (sup<-> sit)  for optimal function at home  Baseline: EVAL - Will assess 2nd visit Goal status: INITIAL  2.  Patient will increase FOTO score to equal to or greater than  52 to demonstrate statistically significant improvement in mobility and quality of life.  Baseline: EVAL = 41 Goal status: INITIAL   3.  Patient will demonstrate all slide board transfers with Min assist (Bed <->  chair)   Baseline: EVAL= Patient unable to transfer from chair to bed due to increased musle weakness Goal status: INITIAL       PLAN:  PT FREQUENCY: 1-2x/week  PT DURATION: 12 weeks  PLANNED INTERVENTIONS: Therapeutic exercises, Therapeutic activity, Neuromuscular re-education, Balance training, Gait training, Patient/Family education, Self Care, Joint mobilization, Joint manipulation, Vestibular training, Canalith repositioning, Prosthetic training, DME instructions, Dry Needling, Electrical stimulation, Wheelchair mobility training, Spinal manipulation, Spinal mobilization, Cryotherapy, Moist heat, Compression bandaging, scar mobilization, Manual therapy, and Re-evaluation  PLAN FOR NEXT SESSION:  Continue to progress LE strengthening and therapeutic activities/prosthetic training.   1:04 PM, 12/27/22 Louis Meckel, PT Physical Therapist - [facility]  Outpatient Physical Therapy- Main Campus [phone]     Lenda Kelp, PT 12/27/2022, 1:04 PM

## 2023-01-02 ENCOUNTER — Ambulatory Visit: Payer: Medicare Other | Attending: Nurse Practitioner

## 2023-01-02 DIAGNOSIS — M6281 Muscle weakness (generalized): Secondary | ICD-10-CM | POA: Diagnosis present

## 2023-01-02 DIAGNOSIS — R2681 Unsteadiness on feet: Secondary | ICD-10-CM | POA: Insufficient documentation

## 2023-01-02 DIAGNOSIS — R269 Unspecified abnormalities of gait and mobility: Secondary | ICD-10-CM | POA: Diagnosis present

## 2023-01-02 DIAGNOSIS — R278 Other lack of coordination: Secondary | ICD-10-CM | POA: Diagnosis present

## 2023-01-02 DIAGNOSIS — R2689 Other abnormalities of gait and mobility: Secondary | ICD-10-CM | POA: Diagnosis present

## 2023-01-02 DIAGNOSIS — R262 Difficulty in walking, not elsewhere classified: Secondary | ICD-10-CM | POA: Insufficient documentation

## 2023-01-02 NOTE — Therapy (Signed)
OUTPATIENT PHYSICAL THERAPY TREATMENT   Patient Name: [name]. MRN: [id number] DOB:1956-04-24, 67 y.o., male Today's Date: 01/02/2023  END OF SESSION:  PT End of Session - 01/02/23 1647     Visit Number 9    Number of Visits 24    Date for PT Re-Evaluation 02/20/23    Authorization Type [employer] Medicare    Progress Note Due on Visit 10    PT Start Time 1354    PT Stop Time 1443    PT Time Calculation (min) 49 min    Equipment Utilized During Treatment Gait belt    Activity Tolerance Patient tolerated treatment well;No increased pain;Patient limited by fatigue    Behavior During Therapy [facility] for tasks assessed/performed                Past Medical History:  Diagnosis Date   Arrhythmia    atrial fibrillation   Asthma    CHF (congestive heart failure) (HCC)    COPD (chronic obstructive pulmonary disease) (HCC)    Coronary artery disease    Depression    Diabetes mellitus without complication (HCC)    Gout    History anabolic steroid use    Hyperlipidemia    Hypertension    Hypogonadism in male    MI (myocardial infarction) (HCC)    Morbid obesity (HCC)    Myocardial infarction (HCC)    Peripheral vascular disease (HCC)    Perirectal abscess    Pleurisy    Sleep apnea    CPAP at night, no oxygen   Varicella    Past Surgical History:  Procedure Laterality Date   ABDOMINAL AORTIC ANEURYSM REPAIR     ACHILLES TENDON SURGERY Left 01/10/2021   Procedure: ACHILLES LENGTHENING/KIDNER;  Surgeon: Rosetta Posner, DPM;  Location: [facility];  Service: Podiatry;  Laterality: Left;   AMPUTATION Left 10/03/2021   Procedure: AMPUTATION BELOW KNEE;  Surgeon: Renford Dills, MD;  Location: [facility];  Service: Vascular;  Laterality: Left;   AMPUTATION Right 05/24/2022   Procedure: AMPUTATION BELOW KNEE;  Surgeon: Annice Needy, MD;  Location: [facility];  Service: General;  Laterality: Right;   AMPUTATION TOE Right 02/10/2016   Procedure: AMPUTATION TOE 3RD TOE;   Surgeon: Gwyneth Revels, DPM;  Location: [facility];  Service: Podiatry;  Laterality: Right;   AMPUTATION TOE Left 02/24/2020   Procedure: AMPUTATION TOE;  Surgeon: Rosetta Posner, DPM;  Location: [facility];  Service: Podiatry;  Laterality: Left;   APPLICATION OF WOUND VAC Left 02/29/2020   Procedure: APPLICATION OF WOUND VAC;  Surgeon: Rosetta Posner, DPM;  Location: [facility];  Service: Podiatry;  Laterality: Left;   CARDIAC CATHETERIZATION     CARDIOVERSION [name] 02/13/2022   Procedure: CARDIOVERSION;  Surgeon: Lamar Blinks, MD;  Location: [facility];  Service: Cardiovascular;  Laterality: [name];   COLONOSCOPY WITH PROPOFOL [name] 11/18/2015   Procedure: COLONOSCOPY WITH PROPOFOL;  Surgeon: Scot Jun, MD;  Location: [facility];  Service: Endoscopy;  Laterality: [name];   CORONARY ARTERY BYPASS GRAFT     CORONARY STENT INTERVENTION [name] 02/02/2020   Procedure: CORONARY STENT INTERVENTION;  Surgeon: Marcina Millard, MD;  Location: [facility];  Service: Cardiovascular;  Laterality: [name];   INCISION AND DRAINAGE Left 08/07/2021   Procedure: INCISION AND DRAINAGE-Partial Calcanectomy;  Surgeon: Rosetta Posner, DPM;  Location: [facility];  Service: Podiatry;  Laterality: Left;   IRRIGATION AND DEBRIDEMENT FOOT Left 02/29/2020   Procedure: IRRIGATION AND DEBRIDEMENT FOOT;  Surgeon: Rosetta Posner,  DPM;  Location: [facility];  Service: Podiatry;  Laterality: Left;   IRRIGATION AND DEBRIDEMENT FOOT Left 02/24/2020   Procedure: IRRIGATION AND DEBRIDEMENT FOOT;  Surgeon: Rosetta Posner, DPM;  Location: [facility];  Service: Podiatry;  Laterality: Left;   KNEE ARTHROSCOPY     LEFT HEART CATH AND CORS/GRAFTS ANGIOGRAPHY [name] 02/02/2020   Procedure: LEFT HEART CATH AND CORS/GRAFTS ANGIOGRAPHY;  Surgeon: Dalia Heading, MD;  Location: [facility];  Service: Cardiovascular;  Laterality: [name];   LOWER EXTREMITY ANGIOGRAPHY Left 02/25/2020   Procedure: Lower Extremity Angiography;  Surgeon: Annice Needy, MD;  Location: [facility];  Service: Cardiovascular;  Laterality: Left;   LOWER EXTREMITY ANGIOGRAPHY Left 01/04/2021   Procedure: LOWER EXTREMITY ANGIOGRAPHY;  Surgeon: Annice Needy, MD;  Location: [facility];  Service: Cardiovascular;  Laterality: Left;   LOWER EXTREMITY ANGIOGRAPHY Right 05/21/2022   Procedure: Lower Extremity Angiography;  Surgeon: Annice Needy, MD;  Location: [facility];  Service: Cardiovascular;  Laterality: Right;   METATARSAL HEAD EXCISION Left 01/10/2021   Procedure: METATARSAL HEAD EXCISION - LEFT 5th;  Surgeon: Rosetta Posner, DPM;  Location: [facility];  Service: Podiatry;  Laterality: Left;   PERIPHERAL VASCULAR CATHETERIZATION Right 01/24/2016   Procedure: Lower Extremity Angiography;  Surgeon: Renford Dills, MD;  Location: [facility];  Service: Cardiovascular;  Laterality: Right;   PERIPHERAL VASCULAR CATHETERIZATION Right 01/25/2016   Procedure: Lower Extremity Angiography;  Surgeon: Renford Dills, MD;  Location: [facility];  Service: Cardiovascular;  Laterality: Right;   TOE AMPUTATION     TONSILLECTOMY     Patient Active Problem List   Diagnosis Date Noted   Status post below-knee amputation of right lower extremity (HCC) 05/31/2022   S/P amputation 05/30/2022   Poorly controlled diabetes mellitus (HCC) 05/30/2022   Acute osteomyelitis of right foot (HCC) 05/19/2022   Wound infection 05/18/2022   Ulcer of right ankle, with fat layer exposed (HCC) 05/18/2022   Non-healing wound of lower extremity, initial encounter 05/18/2022   CAD with Hx of CABG 05/18/2022   Chronic respiratory failure with hypoxia (HCC) 05/18/2022   S/P BKA (below knee amputation), left (HCC) 05/18/2022   COPD (chronic obstructive pulmonary disease) (HCC) 05/18/2022   Perinephric hematoma 03/20/2022   Bacteremia due to Klebsiella pneumoniae 03/19/2022   Hyperglycemia 03/16/2022   Septic shock (HCC) 03/16/2022   Sepsis  secondary to UTI (HCC) 03/15/2022   Acute hypoxemic respiratory failure (HCC) 02/13/2022   Atrial fibrillation with rapid ventricular response (HCC) 02/11/2022   Uncontrolled type 2 diabetes mellitus with hyperglycemia, with long-term current use of insulin (HCC) 02/11/2022   Dyslipidemia 02/11/2022   Hypothyroidism 02/11/2022   Benign prostatic hyperplasia with lower urinary tract symptoms 02/11/2022   Pressure injury of skin of heel 11/04/2021   UTI (urinary tract infection) 10/22/2021   Iron deficiency anemia 10/19/2021   Hyperkalemia 10/14/2021   Acute urinary retention 10/08/2021   Sepsis due to methicillin resistant Staphylococcus aureus (MRSA) (HCC) 10/04/2021   Acute delirium 10/04/2021   Acute on chronic respiratory failure with hypoxia and hypercapnia (HCC) 10/04/2021   Acute respiratory failure with hypoxia and hypercapnia (HCC) 10/03/2021   OSA (obstructive sleep apnea) 10/03/2021   Malnutrition of moderate degree 09/29/2021   Generalized weakness 09/29/2021   Cellulitis of left foot 08/04/2021   Uncontrolled type 2 diabetes mellitus with hypoglycemia, with long-term current use of insulin (HCC) 08/04/2021   Bilateral lower extremity edema 08/04/2021  Acute osteomyelitis of lower leg, left (HCC) 08/04/2021   Swelling of limb 07/18/2021   CHF exacerbation (HCC) 05/08/2021   Diabetes mellitus without complication (HCC)    Atrial fibrillation with RVR (HCC)    Obesity, Class III, BMI 40-49.9 (morbid obesity) (HCC)    Venous stasis ulcer (HCC)    Sinus tachycardia    BPH (benign prostatic hyperplasia)    Chronic anticoagulation    Anasarca    Chronic diastolic CHF (congestive heart failure) (HCC)    Venous stasis of both lower extremities    AF (paroxysmal atrial fibrillation) (HCC)    Lower extremity cellulitis 01/03/2021   Chronic osteomyelitis of left foot (HCC) 01/03/2021   Osteomyelitis (HCC) 01/03/2021   Severe sepsis (HCC) 02/24/2020   AKI (acute kidney injury)  (HCC) 02/24/2020   Bilateral cellulitis of lower leg 02/23/2020   CAD (coronary artery disease) 02/02/2020   RLS (restless legs syndrome) 05/15/2019   Statin myopathy 05/15/2019   Diabetes mellitus type 2 in obese 02/13/2016   Essential hypertension 02/13/2016   PAD (peripheral artery disease) (HCC) 02/13/2016   Pressure injury of skin 01/25/2016   Hypoglycemia associated with diabetes (HCC) 12/27/2013   Long-term insulin use (HCC) 12/27/2013   Microalbuminuria 12/27/2013   B12 deficiency 09/29/2013   Obesity 09/29/2013   CAD (coronary artery disease), autologous vein bypass graft 07/20/2013   Hypersomnia with sleep apnea 07/20/2013   Pure hypercholesterolemia 07/20/2013   Osteomyelitis of ankle or foot 07/20/2011    PCP: Dr. Aram Beecham  REFERRING PROVIDER: Sheppard Plumber, NP  REFERRING DIAG: [phone] (ICD-10-CM) - Status post below-knee amputation of right lower extremity (HCC)   THERAPY DIAG:  Difficulty in walking, not elsewhere classified  Muscle weakness (generalized)  Other abnormalities of gait and mobility  Other lack of coordination  Rationale for Evaluation and Treatment: Rehabilitation  ONSET DATE: 05/24/2022 (date of surgery for latest Below knee amputation- RLE)   SUBJECTIVE:   SUBJECTIVE STATEMENT: Pt reports states his legs were more swollen. States his wife was thrilled that he was able to stand last visit.  PERTINENT HISTORY: Patient is a 67 year old male with CAD/PAF on Eliquis followed by Cardiology.. Obese with BMI>38. HTN stable on meds. Has HLD not on statin due to myopathy. Also with DM on insulin and thyroid dz on Synthroid. (See above section for all PMH) Patient reports he is eager to be here and a long road to recovery- Reports having his left leg amputated last year (10/03/2021) and most recently his right LE - both below the knee amputations. Reports very dependent with all mobility and states ready to get stronger and on his feet. States just  received his prosthesis yesterday.   Patient using [facility] for prosthetic care PAIN:  Are you having pain? No  PRECAUTIONS: Fall  RED FLAGS: None   WEIGHT BEARING RESTRICTIONS: No  FALLS:  Has patient fallen in last 6 months?  Will ask next visit  LIVING ENVIRONMENT: Lives with: lives with their spouse Lives in: House/apartment Stairs:  split level home - 2 step down to den- ramp for entry in home.  Has following equipment at home: Chiropractor, Environmental consultant - 2 wheeled, Wheelchair (manual), [employer], Ramped entry, and [employer], hoyer lift  OCCUPATION: Psychologist, occupational- retired  PLOF: Independent- prior to 10/03/2021 (left BKA)   PATIENT GOALS: "I want to walk with my prostheses"  NEXT MD VISIT: TBA  OBJECTIVE:   TODAY'S TREATMENT:  DATE: 01/02/23   Prosthetic training:  PT worked upon arrival to further don bilateral Below the knee prostheses- Patient reported getting 2 clicks each and PT able to get 3 more clicks on left and 3 more on Right.   *Patient dependently hoyer lifted from manual w/c to sitting position at edge of mat to perform the following:  Therapeutic activities:  Sit to stand - using [place] sit to stand lift x 5 trials - VC to tighten quads and glutes.  Static stand - using Rozell Searing sit to stand lift- 4 trials of 5 min each- Patient reports able to feel like he was placing some weight through his LE's and able to stand more erect with concentration of gluteals today.     PATIENT EDUCATION:  Exercise technique for HEP   HOME EXERCISE PROGRAM: Access Code: [email or web address] URL: [email or web address] Date: 12/20/2022 Prepared by: Maureen Ralphs  Exercises - Supine Quad Set (BKA)  - 1 x daily - 3 x weekly - 3 sets - 10 reps - Sidelying Hip Abduction wtih Flexion and Extension (BKA)  - 1 x daily - 3 x weekly - 3 sets - 10  reps - Supine Single Leg Bridge with Sound Leg (BKA)  - 1 x daily - 3 x weekly - 3 sets - 10 reps - Supine Hip Extension with Towel Roll (BKA)  - 1 x daily - 3 x weekly - 3 sets - 10 reps - Hooklying Clamshell with Resistance  - 1 x daily - 3 x weekly - 3 sets - 10 reps    -determination of a schedule at home for wearing of prosthese and shrinkers; recommended on 3x daily for prostheses with ad lib increase in time. -discussed pros and cons of shrink use/disuse overnight -torubleshooting and practicing self donning of pin in lock of legs   ASSESSMENT:  CLINICAL IMPRESSION: Treatment again focused on static standing with assist of sit to stand lift. Patient was able to activate glutes for more erect standing today. He states he actually felt good with standing and denied any pain in either LE. He will benefit from skilled PT services to improve his overall LE strength, transfer ability, and possibly able to return to walking with prosthetic training for improved mobility and quality of life.    OBJECTIVE IMPAIRMENTS: decreased activity tolerance, decreased balance, decreased endurance, difficulty walking, decreased strength, and obesity.   ACTIVITY LIMITATIONS: carrying, lifting, bending, standing, squatting, stairs, transfers, bed mobility, bathing, toileting, and dressing  PARTICIPATION LIMITATIONS: cleaning, driving, shopping, community activity, occupation, and yard work  PERSONAL FACTORS: Time since onset of injury/illness/exacerbation and 3+ comorbidities: DM, COPD, PAD  are also affecting patient's functional outcome.   REHAB POTENTIAL: Good  CLINICAL DECISION MAKING: Evolving/moderate complexity  EVALUATION COMPLEXITY: Moderate   GOALS: Goals reviewed with patient? Yes  SHORT TERM GOALS: Target date: 01/09/2023  LONG TERM GOALS: Target date: 02/20/2023   1.  Patient will demonstrate modified independent with all bed mobility (sup<-> sit)  for optimal function at  home  Baseline: EVAL - Will assess 2nd visit Goal status: INITIAL  2.  Patient will increase FOTO score to equal to or greater than  52 to demonstrate statistically significant improvement in mobility and quality of life.  Baseline: EVAL = 41 Goal status: INITIAL   3.  Patient will demonstrate all slide board transfers with Min assist (Bed <-> chair)   Baseline: EVAL= Patient unable to transfer from chair to bed due to increased musle weakness Goal status: INITIAL  PLAN:  PT FREQUENCY: 1-2x/week  PT DURATION: 12 weeks  PLANNED INTERVENTIONS: Therapeutic exercises, Therapeutic activity, Neuromuscular re-education, Balance training, Gait training, Patient/Family education, Self Care, Joint mobilization, Joint manipulation, Vestibular training, Canalith repositioning, Prosthetic training, DME instructions, Dry Needling, Electrical stimulation, Wheelchair mobility training, Spinal manipulation, Spinal mobilization, Cryotherapy, Moist heat, Compression bandaging, scar mobilization, Manual therapy, and Re-evaluation  PLAN FOR NEXT SESSION:  Continue to progress LE strengthening and therapeutic activities/prosthetic training. Progress note next visit.   4:52 PM, 01/02/23 Louis Meckel, PT Physical Therapist - [facility]  [facility] [phone]     Lenda Kelp, PT 01/02/2023, 4:52 PM

## 2023-01-08 ENCOUNTER — Ambulatory Visit: Payer: Medicare Other

## 2023-01-08 DIAGNOSIS — R278 Other lack of coordination: Secondary | ICD-10-CM

## 2023-01-08 DIAGNOSIS — R269 Unspecified abnormalities of gait and mobility: Secondary | ICD-10-CM

## 2023-01-08 DIAGNOSIS — R2689 Other abnormalities of gait and mobility: Secondary | ICD-10-CM

## 2023-01-08 DIAGNOSIS — M6281 Muscle weakness (generalized): Secondary | ICD-10-CM

## 2023-01-08 DIAGNOSIS — R262 Difficulty in walking, not elsewhere classified: Secondary | ICD-10-CM | POA: Diagnosis not present

## 2023-01-08 NOTE — Therapy (Signed)
OUTPATIENT PHYSICAL THERAPY TREATMENT/Physical Therapy Progress Note   Dates of reporting period  11/28/2022   to   01/08/2023    Patient Name: [name]. MRN: [id number] DOB:[birth year], 67 y.o., male Today's Date: 01/08/2023  END OF SESSION:  PT End of Session - 01/08/23 1054     Visit Number 10    Number of Visits 24    Date for PT Re-Evaluation 02/20/23    Authorization Type BCBS Medicare    Progress Note Due on Visit 10    PT Start Time 1015    PT Stop Time 1058    PT Time Calculation (min) 43 min    Equipment Utilized During Treatment Gait belt    Activity Tolerance Patient tolerated treatment well;No increased pain;Patient limited by fatigue    Behavior During Therapy [facility] for tasks assessed/performed                Past Medical History:  Diagnosis Date   Arrhythmia    atrial fibrillation   Asthma    CHF (congestive heart failure) (HCC)    COPD (chronic obstructive pulmonary disease) (HCC)    Coronary artery disease    Depression    Diabetes mellitus without complication (HCC)    Gout    History anabolic steroid use    Hyperlipidemia    Hypertension    Hypogonadism in male    MI (myocardial infarction) (HCC)    Morbid obesity (HCC)    Myocardial infarction (HCC)    Peripheral vascular disease (HCC)    Perirectal abscess    Pleurisy    Sleep apnea    CPAP at night, no oxygen   Varicella    Past Surgical History:  Procedure Laterality Date   ABDOMINAL AORTIC ANEURYSM REPAIR     ACHILLES TENDON SURGERY Left 01/10/2021   Procedure: ACHILLES LENGTHENING/KIDNER;  Surgeon: Rosetta Posner, DPM;  Location: [facility];  Service: Podiatry;  Laterality: Left;   AMPUTATION Left 10/03/2021   Procedure: AMPUTATION BELOW KNEE;  Surgeon: Renford Dills, MD;  Location: [facility];  Service: Vascular;  Laterality: Left;   AMPUTATION Right 05/24/2022   Procedure: AMPUTATION BELOW KNEE;  Surgeon: Annice Needy, MD;  Location: [facility];  Service: General;   Laterality: Right;   AMPUTATION TOE Right 02/10/2016   Procedure: AMPUTATION TOE 3RD TOE;  Surgeon: Gwyneth Revels, DPM;  Location: [facility];  Service: Podiatry;  Laterality: Right;   AMPUTATION TOE Left 02/24/2020   Procedure: AMPUTATION TOE;  Surgeon: Rosetta Posner, DPM;  Location: [facility];  Service: Podiatry;  Laterality: Left;   APPLICATION OF WOUND VAC Left 02/29/2020   Procedure: APPLICATION OF WOUND VAC;  Surgeon: Rosetta Posner, DPM;  Location: [facility];  Service: Podiatry;  Laterality: Left;   CARDIAC CATHETERIZATION     CARDIOVERSION [name] 02/13/2022   Procedure: CARDIOVERSION;  Surgeon: Lamar Blinks, MD;  Location: [facility];  Service: Cardiovascular;  Laterality: [name];   COLONOSCOPY WITH PROPOFOL [name] 11/18/2015   Procedure: COLONOSCOPY WITH PROPOFOL;  Surgeon: Scot Jun, MD;  Location: [facility];  Service: Endoscopy;  Laterality: [name];   CORONARY ARTERY BYPASS GRAFT     CORONARY STENT INTERVENTION [name] 02/02/2020   Procedure: CORONARY STENT INTERVENTION;  Surgeon: Marcina Millard, MD;  Location: [facility];  Service: Cardiovascular;  Laterality: [name];   INCISION AND DRAINAGE Left 08/07/2021   Procedure: INCISION AND DRAINAGE-Partial Calcanectomy;  Surgeon: Rosetta Posner, DPM;  Location: [facility];  Service: Podiatry;  Laterality: Left;  IRRIGATION AND DEBRIDEMENT FOOT Left 02/29/2020   Procedure: IRRIGATION AND DEBRIDEMENT FOOT;  Surgeon: Rosetta Posner, DPM;  Location: [facility];  Service: Podiatry;  Laterality: Left;   IRRIGATION AND DEBRIDEMENT FOOT Left 02/24/2020   Procedure: IRRIGATION AND DEBRIDEMENT FOOT;  Surgeon: Rosetta Posner, DPM;  Location: [facility];  Service: Podiatry;  Laterality: Left;   KNEE ARTHROSCOPY     LEFT HEART CATH AND CORS/GRAFTS ANGIOGRAPHY [name] 02/02/2020   Procedure: LEFT HEART CATH AND CORS/GRAFTS ANGIOGRAPHY;  Surgeon: Dalia Heading, MD;  Location: [facility];  Service: Cardiovascular;  Laterality: [name];   LOWER  EXTREMITY ANGIOGRAPHY Left 02/25/2020   Procedure: Lower Extremity Angiography;  Surgeon: Annice Needy, MD;  Location: [facility];  Service: Cardiovascular;  Laterality: Left;   LOWER EXTREMITY ANGIOGRAPHY Left 01/04/2021   Procedure: LOWER EXTREMITY ANGIOGRAPHY;  Surgeon: Annice Needy, MD;  Location: [facility];  Service: Cardiovascular;  Laterality: Left;   LOWER EXTREMITY ANGIOGRAPHY Right 05/21/2022   Procedure: Lower Extremity Angiography;  Surgeon: Annice Needy, MD;  Location: [facility];  Service: Cardiovascular;  Laterality: Right;   METATARSAL HEAD EXCISION Left 01/10/2021   Procedure: METATARSAL HEAD EXCISION - LEFT 5th;  Surgeon: Rosetta Posner, DPM;  Location: [facility];  Service: Podiatry;  Laterality: Left;   PERIPHERAL VASCULAR CATHETERIZATION Right 01/24/2016   Procedure: Lower Extremity Angiography;  Surgeon: Renford Dills, MD;  Location: [facility];  Service: Cardiovascular;  Laterality: Right;   PERIPHERAL VASCULAR CATHETERIZATION Right 01/25/2016   Procedure: Lower Extremity Angiography;  Surgeon: Renford Dills, MD;  Location: [facility];  Service: Cardiovascular;  Laterality: Right;   TOE AMPUTATION     TONSILLECTOMY     Patient Active Problem List   Diagnosis Date Noted   Status post below-knee amputation of right lower extremity (HCC) 05/31/2022   S/P amputation 05/30/2022   Poorly controlled diabetes mellitus (HCC) 05/30/2022   Acute osteomyelitis of right foot (HCC) 05/19/2022   Wound infection 05/18/2022   Ulcer of right ankle, with fat layer exposed (HCC) 05/18/2022   Non-healing wound of lower extremity, initial encounter 05/18/2022   CAD with Hx of CABG 05/18/2022   Chronic respiratory failure with hypoxia (HCC) 05/18/2022   S/P BKA (below knee amputation), left (HCC) 05/18/2022   COPD (chronic obstructive pulmonary disease) (HCC) 05/18/2022   Perinephric hematoma 03/20/2022   Bacteremia due to Klebsiella  pneumoniae 03/19/2022   Hyperglycemia 03/16/2022   Septic shock (HCC) 03/16/2022   Sepsis secondary to UTI (HCC) 03/15/2022   Acute hypoxemic respiratory failure (HCC) 02/13/2022   Atrial fibrillation with rapid ventricular response (HCC) 02/11/2022   Uncontrolled type 2 diabetes mellitus with hyperglycemia, with long-term current use of insulin (HCC) 02/11/2022   Dyslipidemia 02/11/2022   Hypothyroidism 02/11/2022   Benign prostatic hyperplasia with lower urinary tract symptoms 02/11/2022   Pressure injury of skin of heel 11/04/2021   UTI (urinary tract infection) 10/22/2021   Iron deficiency anemia 10/19/2021   Hyperkalemia 10/14/2021   Acute urinary retention 10/08/2021   Sepsis due to methicillin resistant Staphylococcus aureus (MRSA) (HCC) 10/04/2021   Acute delirium 10/04/2021   Acute on chronic respiratory failure with hypoxia and hypercapnia (HCC) 10/04/2021   Acute respiratory failure with hypoxia and hypercapnia (HCC) 10/03/2021   OSA (obstructive sleep apnea) 10/03/2021   Malnutrition of moderate degree 09/29/2021   Generalized weakness 09/29/2021   Cellulitis of left foot 08/04/2021   Uncontrolled type 2 diabetes mellitus with  hypoglycemia, with long-term current use of insulin (HCC) 08/04/2021   Bilateral lower extremity edema 08/04/2021   Acute osteomyelitis of lower leg, left (HCC) 08/04/2021   Swelling of limb 07/18/2021   CHF exacerbation (HCC) 05/08/2021   Diabetes mellitus without complication (HCC)    Atrial fibrillation with RVR (HCC)    Obesity, Class III, BMI 40-49.9 (morbid obesity) (HCC)    Venous stasis ulcer (HCC)    Sinus tachycardia    BPH (benign prostatic hyperplasia)    Chronic anticoagulation    Anasarca    Chronic diastolic CHF (congestive heart failure) (HCC)    Venous stasis of both lower extremities    AF (paroxysmal atrial fibrillation) (HCC)    Lower extremity cellulitis 01/03/2021   Chronic osteomyelitis of left foot (HCC) 01/03/2021    Osteomyelitis (HCC) 01/03/2021   Severe sepsis (HCC) 02/24/2020   AKI (acute kidney injury) (HCC) 02/24/2020   Bilateral cellulitis of lower leg 02/23/2020   CAD (coronary artery disease) 02/02/2020   RLS (restless legs syndrome) 05/15/2019   Statin myopathy 05/15/2019   Diabetes mellitus type 2 in obese 02/13/2016   Essential hypertension 02/13/2016   PAD (peripheral artery disease) (HCC) 02/13/2016   Pressure injury of skin 01/25/2016   Hypoglycemia associated with diabetes (HCC) 12/27/2013   Long-term insulin use (HCC) 12/27/2013   Microalbuminuria 12/27/2013   B12 deficiency 09/29/2013   Obesity 09/29/2013   CAD (coronary artery disease), autologous vein bypass graft 07/20/2013   Hypersomnia with sleep apnea 07/20/2013   Pure hypercholesterolemia 07/20/2013   Osteomyelitis of ankle or foot 07/20/2011    PCP: Dr. Aram Beecham  REFERRING PROVIDER: Sheppard Plumber, NP  REFERRING DIAG: [phone] (ICD-10-CM) - Status post below-knee amputation of right lower extremity (HCC)   THERAPY DIAG:  Difficulty in walking, not elsewhere classified  Muscle weakness (generalized)  Other abnormalities of gait and mobility  Other lack of coordination  Abnormality of gait and mobility  Rationale for Evaluation and Treatment: Rehabilitation  ONSET DATE: 05/24/2022 (date of surgery for latest Below knee amputation- RLE)   SUBJECTIVE:   SUBJECTIVE STATEMENT: Pt reports states he was unable to don his prostheses today most likely due to increase swelling. He states he is compliant with current HEP as able.   PERTINENT HISTORY: Patient is a 67 year old male with CAD/PAF on Eliquis followed by Cardiology.. Obese with BMI>38. HTN stable on meds. Has HLD not on statin due to myopathy. Also with DM on insulin and thyroid dz on Synthroid. (See above section for all PMH) Patient reports he is eager to be here and a long road to recovery- Reports having his left leg amputated last year (10/03/2021)  and most recently his right LE - both below the knee amputations. Reports very dependent with all mobility and states ready to get stronger and on his feet. States just received his prosthesis yesterday.   Patient using [facility] for prosthetic care PAIN:  Are you having pain? No  PRECAUTIONS: Fall  RED FLAGS: None   WEIGHT BEARING RESTRICTIONS: No  FALLS:  Has patient fallen in last 6 months?  Will ask next visit  LIVING ENVIRONMENT: Lives with: lives with their spouse Lives in: House/apartment Stairs:  split level home - 2 step down to den- ramp for entry in home.  Has following equipment at home: Quad cane large base, Environmental consultant - 2 wheeled, Wheelchair (manual), [employer], Ramped entry, and [employer], hoyer lift  OCCUPATION: Psychologist, occupational- retired  PLOF: Independent- prior to 10/03/2021 (left BKA)  PATIENT GOALS: "I want to walk with my prostheses"  NEXT MD VISIT: TBA  OBJECTIVE:   TODAY'S TREATMENT:                                                                                                                              DATE: 01/08/23   Prosthetic training:  PT worked upon arrival to don bilateral Below the knee prostheses- Patient reported unable to get any clicks- PT assisted and also unable to don- Stressed importance of wearing shrinker to keep LE from swelling.    *Patient dependently hoyer lifted from manual w/c to supine position on mat to perform the following:   Therex:   Supine- quad sets (hold 5 sec) x 10 reps Resistive SLR- 2 sets of 12 reps GTB Resistive Supine Hip add - 2 sets of 12 reps GTB Resistive Supine Hip ABD- 2 sets of 12 reps GTB Supine Bridging with LE on bolster- 2sets of 10 reps     PATIENT EDUCATION:  Exercise technique for HEP   HOME EXERCISE PROGRAM: Access Code: [email or web address] URL: [email or web address] Date: 12/20/2022 Prepared by: Maureen Ralphs  Exercises - Supine Quad Set (BKA)  - 1 x daily - 3 x weekly - 3 sets -  10 reps - Sidelying Hip Abduction wtih Flexion and Extension (BKA)  - 1 x daily - 3 x weekly - 3 sets - 10 reps - Supine Single Leg Bridge with Sound Leg (BKA)  - 1 x daily - 3 x weekly - 3 sets - 10 reps - Supine Hip Extension with Towel Roll (BKA)  - 1 x daily - 3 x weekly - 3 sets - 10 reps - Hooklying Clamshell with Resistance  - 1 x daily - 3 x weekly - 3 sets - 10 reps    -determination of a schedule at home for wearing of prosthese and shrinkers; recommended on 3x daily for prostheses with ad lib increase in time. -discussed pros and cons of shrink use/disuse overnight -torubleshooting and practicing self donning of pin in lock of legs   ASSESSMENT:  CLINICAL IMPRESSION: Patient arrived in good spirits but unable to don his prosthesis today due to some swelling. Unable to test several goals for progress note but will address all of them next visit. He responded well overall to supine therex with fatigue as limiting factor. Patient's condition has the potential to improve in response to therapy. Maximum improvement is yet to be obtained. The anticipated improvement is attainable and reasonable in a generally predictable time.   He will benefit from skilled PT services to improve his overall LE strength, transfer ability, and possibly able to return to walking with prosthetic training for improved mobility and quality of life.    OBJECTIVE IMPAIRMENTS: decreased activity tolerance, decreased balance, decreased endurance, difficulty walking, decreased strength, and obesity.   ACTIVITY LIMITATIONS: carrying, lifting, bending, standing, squatting, stairs, transfers, bed mobility, bathing, toileting, and dressing  PARTICIPATION LIMITATIONS: cleaning,  driving, shopping, community activity, occupation, and yard work  PERSONAL FACTORS: Time since onset of injury/illness/exacerbation and 3+ comorbidities: DM, COPD, PAD  are also affecting patient's functional outcome.   REHAB POTENTIAL:  Good  CLINICAL DECISION MAKING: Evolving/moderate complexity  EVALUATION COMPLEXITY: Moderate   GOALS: Goals reviewed with patient? Yes  SHORT TERM GOALS: Target date: 01/09/2023  LONG TERM GOALS: Target date: 02/20/2023   1.  Patient will demonstrate modified independent with all bed mobility (sup<-> sit)  for optimal function at home  Baseline: EVAL - Will assess 2nd visit; 01/08/2023- patient able to roll with assist of wife Goal status: ONGOING  2.  Patient will increase FOTO score to equal to or greater than  52 to demonstrate statistically significant improvement in mobility and quality of life.  Baseline: EVAL = 41 Goal status: INITIAL   3.  Patient will demonstrate all slide board transfers with Min assist (Bed <-> chair)   Baseline: EVAL= Patient unable to transfer from chair to bed due to increased musle weakness; 01/08/2023- Patient continues to require hoyer lift for all transfers Goal status: INITIAL       PLAN:  PT FREQUENCY: 1-2x/week  PT DURATION: 12 weeks  PLANNED INTERVENTIONS: Therapeutic exercises, Therapeutic activity, Neuromuscular re-education, Balance training, Gait training, Patient/Family education, Self Care, Joint mobilization, Joint manipulation, Vestibular training, Canalith repositioning, Prosthetic training, DME instructions, Dry Needling, Electrical stimulation, Wheelchair mobility training, Spinal manipulation, Spinal mobilization, Cryotherapy, Moist heat, Compression bandaging, scar mobilization, Manual therapy, and Re-evaluation  PLAN FOR NEXT SESSION:  Continue to progress LE strengthening and therapeutic activities/prosthetic training. Progress note next visit.   4:08 PM, 01/08/23 Louis Meckel, PT Physical Therapist - [facility] [phone]     Lenda Kelp, PT 01/08/2023, 4:08 PM

## 2023-01-10 ENCOUNTER — Other Ambulatory Visit: Payer: Self-pay

## 2023-01-10 MED ORDER — FREESTYLE LIBRE 3 SENSOR MISC
1.0000 | 3 refills | Status: DC
  Filled 2023-01-10: qty 2, 28d supply, fill #0
  Filled 2023-02-20: qty 6, 84d supply, fill #0
  Filled 2023-02-20: qty 2, 28d supply, fill #0

## 2023-01-17 ENCOUNTER — Ambulatory Visit
Admission: RE | Admit: 2023-01-17 | Discharge: 2023-01-17 | Disposition: A | Discharge status: Home / Self Care | Payer: Medicare Other | Source: Ambulatory Visit | Attending: Neurology | Admitting: Neurology

## 2023-01-17 ENCOUNTER — Ambulatory Visit: Payer: Medicare Other

## 2023-01-17 DIAGNOSIS — R262 Difficulty in walking, not elsewhere classified: Secondary | ICD-10-CM

## 2023-01-17 DIAGNOSIS — R2689 Other abnormalities of gait and mobility: Secondary | ICD-10-CM

## 2023-01-17 DIAGNOSIS — R251 Tremor, unspecified: Secondary | ICD-10-CM | POA: Insufficient documentation

## 2023-01-17 DIAGNOSIS — M6281 Muscle weakness (generalized): Secondary | ICD-10-CM

## 2023-01-17 DIAGNOSIS — R269 Unspecified abnormalities of gait and mobility: Secondary | ICD-10-CM

## 2023-01-17 DIAGNOSIS — R278 Other lack of coordination: Secondary | ICD-10-CM

## 2023-01-17 NOTE — Therapy (Signed)
OUTPATIENT PHYSICAL THERAPY TREATMENT    Patient Name: [name]. MRN: [id number] DOB:01/26/1956, 67 y.o., male Today's Date: 01/17/2023  END OF SESSION:  PT End of Session - 01/17/23 1524     Visit Number 10    Number of Visits 24    Date for PT Re-Evaluation 02/20/23    Authorization Type [employer]BS Medicare    Progress Note Due on Visit 10    PT Start Time 1315    PT Stop Time 1340    PT Time Calculation (min) 25 min    Equipment Utilized During Treatment Gait belt    Activity Tolerance Patient tolerated treatment well;No increased pain;Patient limited by fatigue    Behavior During Therapy [facility] for tasks assessed/performed                 Past Medical History:  Diagnosis Date   Arrhythmia    atrial fibrillation   Asthma    CHF (congestive heart failure) (HCC)    COPD (chronic obstructive pulmonary disease) (HCC)    Coronary artery disease    Depression    Diabetes mellitus without complication (HCC)    Gout    History anabolic steroid use    Hyperlipidemia    Hypertension    Hypogonadism in male    MI (myocardial infarction) (HCC)    Morbid obesity (HCC)    Myocardial infarction (HCC)    Peripheral vascular disease (HCC)    Perirectal abscess    Pleurisy    Sleep apnea    CPAP at night, no oxygen   Varicella    Past Surgical History:  Procedure Laterality Date   ABDOMINAL AORTIC ANEURYSM REPAIR     ACHILLES TENDON SURGERY Left 01/10/2021   Procedure: ACHILLES LENGTHENING/KIDNER;  Surgeon: Rosetta Posner, DPM;  Location: [facility];  Service: Podiatry;  Laterality: Left;   AMPUTATION Left 10/03/2021   Procedure: AMPUTATION BELOW KNEE;  Surgeon: Renford Dills, MD;  Location: [facility];  Service: Vascular;  Laterality: Left;   AMPUTATION Right 05/24/2022   Procedure: AMPUTATION BELOW KNEE;  Surgeon: Annice Needy, MD;  Location: [facility];  Service: General;  Laterality: Right;   AMPUTATION TOE Right 02/10/2016   Procedure: AMPUTATION TOE 3RD  TOE;  Surgeon: Gwyneth Revels, DPM;  Location: [facility];  Service: Podiatry;  Laterality: Right;   AMPUTATION TOE Left 02/24/2020   Procedure: AMPUTATION TOE;  Surgeon: Rosetta Posner, DPM;  Location: [facility];  Service: Podiatry;  Laterality: Left;   APPLICATION OF WOUND VAC Left 02/29/2020   Procedure: APPLICATION OF WOUND VAC;  Surgeon: Rosetta Posner, DPM;  Location: [facility];  Service: Podiatry;  Laterality: Left;   CARDIAC CATHETERIZATION     CARDIOVERSION [name] 02/13/2022   Procedure: CARDIOVERSION;  Surgeon: Lamar Blinks, MD;  Location: [facility];  Service: Cardiovascular;  Laterality: [name];   COLONOSCOPY WITH PROPOFOL [name] 11/18/2015   Procedure: COLONOSCOPY WITH PROPOFOL;  Surgeon: Scot Jun, MD;  Location: [facility];  Service: Endoscopy;  Laterality: [name];   CORONARY ARTERY BYPASS GRAFT     CORONARY STENT INTERVENTION [name] 02/02/2020   Procedure: CORONARY STENT INTERVENTION;  Surgeon: Marcina Millard, MD;  Location: [facility];  Service: Cardiovascular;  Laterality: [name];   INCISION AND DRAINAGE Left 08/07/2021   Procedure: INCISION AND DRAINAGE-Partial Calcanectomy;  Surgeon: Rosetta Posner, DPM;  Location: [facility];  Service: Podiatry;  Laterality: Left;   IRRIGATION AND DEBRIDEMENT FOOT Left 02/29/2020   Procedure: IRRIGATION AND DEBRIDEMENT FOOT;  Surgeon:  Rosetta Posner, DPM;  Location: [facility];  Service: Podiatry;  Laterality: Left;   IRRIGATION AND DEBRIDEMENT FOOT Left 02/24/2020   Procedure: IRRIGATION AND DEBRIDEMENT FOOT;  Surgeon: Rosetta Posner, DPM;  Location: [facility];  Service: Podiatry;  Laterality: Left;   KNEE ARTHROSCOPY     LEFT HEART CATH AND CORS/GRAFTS ANGIOGRAPHY [name] 02/02/2020   Procedure: LEFT HEART CATH AND CORS/GRAFTS ANGIOGRAPHY;  Surgeon: Dalia Heading, MD;  Location: [facility];  Service: Cardiovascular;  Laterality: [name];   LOWER EXTREMITY ANGIOGRAPHY Left 02/25/2020   Procedure: Lower Extremity Angiography;  Surgeon:  Annice Needy, MD;  Location: [facility];  Service: Cardiovascular;  Laterality: Left;   LOWER EXTREMITY ANGIOGRAPHY Left 01/04/2021   Procedure: LOWER EXTREMITY ANGIOGRAPHY;  Surgeon: Annice Needy, MD;  Location: [facility];  Service: Cardiovascular;  Laterality: Left;   LOWER EXTREMITY ANGIOGRAPHY Right 05/21/2022   Procedure: Lower Extremity Angiography;  Surgeon: Annice Needy, MD;  Location: [facility];  Service: Cardiovascular;  Laterality: Right;   METATARSAL HEAD EXCISION Left 01/10/2021   Procedure: METATARSAL HEAD EXCISION - LEFT 5th;  Surgeon: Rosetta Posner, DPM;  Location: [facility];  Service: Podiatry;  Laterality: Left;   PERIPHERAL VASCULAR CATHETERIZATION Right 01/24/2016   Procedure: Lower Extremity Angiography;  Surgeon: Renford Dills, MD;  Location: [facility];  Service: Cardiovascular;  Laterality: Right;   PERIPHERAL VASCULAR CATHETERIZATION Right 01/25/2016   Procedure: Lower Extremity Angiography;  Surgeon: Renford Dills, MD;  Location: [facility];  Service: Cardiovascular;  Laterality: Right;   TOE AMPUTATION     TONSILLECTOMY     Patient Active Problem List   Diagnosis Date Noted   Status post below-knee amputation of right lower extremity (HCC) 05/31/2022   S/P amputation 05/30/2022   Poorly controlled diabetes mellitus (HCC) 05/30/2022   Acute osteomyelitis of right foot (HCC) 05/19/2022   Wound infection 05/18/2022   Ulcer of right ankle, with fat layer exposed (HCC) 05/18/2022   Non-healing wound of lower extremity, initial encounter 05/18/2022   CAD with Hx of CABG 05/18/2022   Chronic respiratory failure with hypoxia (HCC) 05/18/2022   S/P BKA (below knee amputation), left (HCC) 05/18/2022   COPD (chronic obstructive pulmonary disease) (HCC) 05/18/2022   Perinephric hematoma 03/20/2022   Bacteremia due to Klebsiella pneumoniae 03/19/2022   Hyperglycemia 03/16/2022   Septic shock (HCC) 03/16/2022    Sepsis secondary to UTI (HCC) 03/15/2022   Acute hypoxemic respiratory failure (HCC) 02/13/2022   Atrial fibrillation with rapid ventricular response (HCC) 02/11/2022   Uncontrolled type 2 diabetes mellitus with hyperglycemia, with long-term current use of insulin (HCC) 02/11/2022   Dyslipidemia 02/11/2022   Hypothyroidism 02/11/2022   Benign prostatic hyperplasia with lower urinary tract symptoms 02/11/2022   Pressure injury of skin of heel 11/04/2021   UTI (urinary tract infection) 10/22/2021   Iron deficiency anemia 10/19/2021   Hyperkalemia 10/14/2021   Acute urinary retention 10/08/2021   Sepsis due to methicillin resistant Staphylococcus aureus (MRSA) (HCC) 10/04/2021   Acute delirium 10/04/2021   Acute on chronic respiratory failure with hypoxia and hypercapnia (HCC) 10/04/2021   Acute respiratory failure with hypoxia and hypercapnia (HCC) 10/03/2021   OSA (obstructive sleep apnea) 10/03/2021   Malnutrition of moderate degree 09/29/2021   Generalized weakness 09/29/2021   Cellulitis of left foot 08/04/2021   Uncontrolled type 2 diabetes mellitus with hypoglycemia, with long-term current use of insulin (HCC) 08/04/2021   Bilateral lower extremity edema  08/04/2021   Acute osteomyelitis of lower leg, left (HCC) 08/04/2021   Swelling of limb 07/18/2021   CHF exacerbation (HCC) 05/08/2021   Diabetes mellitus without complication (HCC)    Atrial fibrillation with RVR (HCC)    Obesity, Class III, BMI 40-49.9 (morbid obesity) (HCC)    Venous stasis ulcer (HCC)    Sinus tachycardia    BPH (benign prostatic hyperplasia)    Chronic anticoagulation    Anasarca    Chronic diastolic CHF (congestive heart failure) (HCC)    Venous stasis of both lower extremities    AF (paroxysmal atrial fibrillation) (HCC)    Lower extremity cellulitis 01/03/2021   Chronic osteomyelitis of left foot (HCC) 01/03/2021   Osteomyelitis (HCC) 01/03/2021   Severe sepsis (HCC) 02/24/2020   AKI (acute kidney  injury) (HCC) 02/24/2020   Bilateral cellulitis of lower leg 02/23/2020   CAD (coronary artery disease) 02/02/2020   RLS (restless legs syndrome) 05/15/2019   Statin myopathy 05/15/2019   Diabetes mellitus type 2 in obese 02/13/2016   Essential hypertension 02/13/2016   PAD (peripheral artery disease) (HCC) 02/13/2016   Pressure injury of skin 01/25/2016   Hypoglycemia associated with diabetes (HCC) 12/27/2013   Long-term insulin use (HCC) 12/27/2013   Microalbuminuria 12/27/2013   B12 deficiency 09/29/2013   Obesity 09/29/2013   CAD (coronary artery disease), autologous vein bypass graft 07/20/2013   Hypersomnia with sleep apnea 07/20/2013   Pure hypercholesterolemia 07/20/2013   Osteomyelitis of ankle or foot 07/20/2011    PCP: Dr. Aram Beecham  REFERRING PROVIDER: Sheppard Plumber, NP  REFERRING DIAG: [phone] (ICD-10-CM) - Status post below-knee amputation of right lower extremity (HCC)   THERAPY DIAG:  Difficulty in walking, not elsewhere classified  Muscle weakness (generalized)  Other abnormalities of gait and mobility  Other lack of coordination  Abnormality of gait and mobility  Rationale for Evaluation and Treatment: Rehabilitation  ONSET DATE: 05/24/2022 (date of surgery for latest Below knee amputation- RLE)   SUBJECTIVE:   SUBJECTIVE STATEMENT: Pt reports states he continues to have difficulty donning his prostheses. "I got one click on my right leg and none on the left." He endorses compliance with wearing shrinker   PERTINENT HISTORY: Patient is a 67 year old male with CAD/PAF on Eliquis followed by Cardiology.. Obese with BMI>38. HTN stable on meds. Has HLD not on statin due to myopathy. Also with DM on insulin and thyroid dz on Synthroid. (See above section for all PMH) Patient reports he is eager to be here and a long road to recovery- Reports having his left leg amputated last year (10/03/2021) and most recently his right LE - both below the knee  amputations. Reports very dependent with all mobility and states ready to get stronger and on his feet. States just received his prosthesis yesterday.   Patient using [facility] for prosthetic care PAIN:  Are you having pain? No  PRECAUTIONS: Fall  RED FLAGS: None   WEIGHT BEARING RESTRICTIONS: No  FALLS:  Has patient fallen in last 6 months?  Will ask next visit  LIVING ENVIRONMENT: Lives with: lives with their spouse Lives in: House/apartment Stairs:  split level home - 2 step down to den- ramp for entry in home.  Has following equipment at home: Quad cane large base, Environmental consultant - 2 wheeled, Wheelchair (manual), [employer], Ramped entry, and [employer], hoyer lift  OCCUPATION: Psychologist, occupational- retired  PLOF: Independent- prior to 10/03/2021 (left BKA)   PATIENT GOALS: "I want to walk with my prostheses"  NEXT MD VISIT: TBA  OBJECTIVE:   TODAY'S TREATMENT:                                                                                                                              DATE: 01/17/23   Non-billable treatment today. PT attempted to don both prosthesis- only able to get 2 clicks on right and none on left. Unsure if left LE was too swollen or just ill fitting pin today. Patient had another appointment in hospital today. Emphasized importance of wearing shrinker and patient reports he has been compliant. Advised him that he would likely need follow up with Prosthetist to further evaluate. Patient to call [facility] to discuss and stated he needed another shrinker.      PATIENT EDUCATION:  Exercise technique for HEP   HOME EXERCISE PROGRAM: Access Code: [email or web address] URL: [email or web address] Date: 12/20/2022 Prepared by: Maureen Ralphs  Exercises - Supine Quad Set (BKA)  - 1 x daily - 3 x weekly - 3 sets - 10 reps - Sidelying Hip Abduction wtih Flexion and Extension (BKA)  - 1 x daily - 3 x weekly - 3 sets - 10 reps - Supine Single Leg Bridge with Sound Leg (BKA)   - 1 x daily - 3 x weekly - 3 sets - 10 reps - Supine Hip Extension with Towel Roll (BKA)  - 1 x daily - 3 x weekly - 3 sets - 10 reps - Hooklying Clamshell with Resistance  - 1 x daily - 3 x weekly - 3 sets - 10 reps    -determination of a schedule at home for wearing of prosthese and shrinkers; recommended on 3x daily for prostheses with ad lib increase in time. -discussed pros and cons of shrink use/disuse overnight   ASSESSMENT:  CLINICAL IMPRESSION: Non- billable visit today due to increased time spend trying to don his prostheses today and unsuccessful. Patient to seek advise from [facility] and if difficulty- PT will reach out to Judy Pimple, [facility] to see if he may available on a date that the patient is coming to PT to observe/assess ill fitting prostheses. He will benefit from skilled PT services to improve his overall LE strength, transfer ability, and possibly able to return to walking with prosthetic training for improved mobility and quality of life.    OBJECTIVE IMPAIRMENTS: decreased activity tolerance, decreased balance, decreased endurance, difficulty walking, decreased strength, and obesity.   ACTIVITY LIMITATIONS: carrying, lifting, bending, standing, squatting, stairs, transfers, bed mobility, bathing, toileting, and dressing  PARTICIPATION LIMITATIONS: cleaning, driving, shopping, community activity, occupation, and yard work  PERSONAL FACTORS: Time since onset of injury/illness/exacerbation and 3+ comorbidities: DM, COPD, PAD  are also affecting patient's functional outcome.   REHAB POTENTIAL: Good  CLINICAL DECISION MAKING: Evolving/moderate complexity  EVALUATION COMPLEXITY: Moderate   GOALS: Goals reviewed with patient? Yes  SHORT TERM GOALS: Target date: 01/09/2023  LONG TERM GOALS: Target date: 02/20/2023   1.  Patient will  demonstrate modified independent with all bed mobility (sup<-> sit)  for optimal function at home  Baseline: EVAL - Will assess 2nd  visit; 01/08/2023- patient able to roll with assist of wife Goal status: ONGOING  2.  Patient will increase FOTO score to equal to or greater than  52 to demonstrate statistically significant improvement in mobility and quality of life.  Baseline: EVAL = 41 Goal status: INITIAL   3.  Patient will demonstrate all slide board transfers with Min assist (Bed <-> chair)   Baseline: EVAL= Patient unable to transfer from chair to bed due to increased musle weakness; 01/08/2023- Patient continues to require hoyer lift for all transfers Goal status: INITIAL       PLAN:  PT FREQUENCY: 1-2x/week  PT DURATION: 12 weeks  PLANNED INTERVENTIONS: Therapeutic exercises, Therapeutic activity, Neuromuscular re-education, Balance training, Gait training, Patient/Family education, Self Care, Joint mobilization, Joint manipulation, Vestibular training, Canalith repositioning, Prosthetic training, DME instructions, Dry Needling, Electrical stimulation, Wheelchair mobility training, Spinal manipulation, Spinal mobilization, Cryotherapy, Moist heat, Compression bandaging, scar mobilization, Manual therapy, and Re-evaluation  PLAN FOR NEXT SESSION:  Continue to progress LE strengthening and therapeutic activities/prosthetic training. Progress note next visit.   3:25 PM, 01/17/23 Louis Meckel, PT Physical Therapist - [facility]  [facility] [phone]     Lenda Kelp, PT 01/17/2023, 3:25 PM

## 2023-01-21 ENCOUNTER — Ambulatory Visit: Payer: Medicare Other

## 2023-01-21 DIAGNOSIS — M6281 Muscle weakness (generalized): Secondary | ICD-10-CM

## 2023-01-21 DIAGNOSIS — R2689 Other abnormalities of gait and mobility: Secondary | ICD-10-CM

## 2023-01-21 DIAGNOSIS — R278 Other lack of coordination: Secondary | ICD-10-CM

## 2023-01-21 DIAGNOSIS — R269 Unspecified abnormalities of gait and mobility: Secondary | ICD-10-CM

## 2023-01-21 DIAGNOSIS — R262 Difficulty in walking, not elsewhere classified: Secondary | ICD-10-CM

## 2023-01-21 NOTE — Therapy (Incomplete)
OUTPATIENT PHYSICAL THERAPY TREATMENT    Patient Name: [name]. MRN: [id number] DOB:01-14-1956, 67 y.o., male Today's Date: 01/22/2023  END OF SESSION:  PT End of Session - 01/21/23 2221     Visit Number 11    Number of Visits 24    Date for PT Re-Evaluation 02/20/23    Authorization Type BCBS Medicare    Progress Note Due on Visit 10    PT Start Time 1400    PT Stop Time 1443    PT Time Calculation (min) 43 min    Equipment Utilized During Treatment Gait belt    Activity Tolerance Patient tolerated treatment well;No increased pain;Patient limited by fatigue    Behavior During Therapy [facility] for tasks assessed/performed                  Past Medical History:  Diagnosis Date   Arrhythmia    atrial fibrillation   Asthma    CHF (congestive heart failure) (HCC)    COPD (chronic obstructive pulmonary disease) (HCC)    Coronary artery disease    Depression    Diabetes mellitus without complication (HCC)    Gout    History anabolic steroid use    Hyperlipidemia    Hypertension    Hypogonadism in male    MI (myocardial infarction) (HCC)    Morbid obesity (HCC)    Myocardial infarction (HCC)    Peripheral vascular disease (HCC)    Perirectal abscess    Pleurisy    Sleep apnea    CPAP at night, no oxygen   Varicella    Past Surgical History:  Procedure Laterality Date   ABDOMINAL AORTIC ANEURYSM REPAIR     ACHILLES TENDON SURGERY Left 01/10/2021   Procedure: ACHILLES LENGTHENING/KIDNER;  Surgeon: Rosetta Posner, DPM;  Location: [facility];  Service: Podiatry;  Laterality: Left;   AMPUTATION Left 10/03/2021   Procedure: AMPUTATION BELOW KNEE;  Surgeon: Renford Dills, MD;  Location: [facility];  Service: Vascular;  Laterality: Left;   AMPUTATION Right 05/24/2022   Procedure: AMPUTATION BELOW KNEE;  Surgeon: Annice Needy, MD;  Location: [facility];  Service: General;  Laterality: Right;   AMPUTATION TOE Right 02/10/2016   Procedure: AMPUTATION TOE 3RD  TOE;  Surgeon: Gwyneth Revels, DPM;  Location: [facility];  Service: Podiatry;  Laterality: Right;   AMPUTATION TOE Left 02/24/2020   Procedure: AMPUTATION TOE;  Surgeon: Rosetta Posner, DPM;  Location: [facility];  Service: Podiatry;  Laterality: Left;   APPLICATION OF WOUND VAC Left 02/29/2020   Procedure: APPLICATION OF WOUND VAC;  Surgeon: Rosetta Posner, DPM;  Location: [facility];  Service: Podiatry;  Laterality: Left;   CARDIAC CATHETERIZATION     CARDIOVERSION [name] 02/13/2022   Procedure: CARDIOVERSION;  Surgeon: Lamar Blinks, MD;  Location: [facility];  Service: Cardiovascular;  Laterality: [name];   COLONOSCOPY WITH PROPOFOL [name] 11/18/2015   Procedure: COLONOSCOPY WITH PROPOFOL;  Surgeon: Scot Jun, MD;  Location: [facility];  Service: Endoscopy;  Laterality: [name];   CORONARY ARTERY BYPASS GRAFT     CORONARY STENT INTERVENTION [name] 02/02/2020   Procedure: CORONARY STENT INTERVENTION;  Surgeon: Marcina Millard, MD;  Location: [facility];  Service: Cardiovascular;  Laterality: [name];   INCISION AND DRAINAGE Left 08/07/2021   Procedure: INCISION AND DRAINAGE-Partial Calcanectomy;  Surgeon: Rosetta Posner, DPM;  Location: [facility];  Service: Podiatry;  Laterality: Left;   IRRIGATION AND DEBRIDEMENT FOOT Left 02/29/2020   Procedure: IRRIGATION AND DEBRIDEMENT FOOT;  Surgeon: Rosetta Posner, DPM;  Location: [facility];  Service: Podiatry;  Laterality: Left;   IRRIGATION AND DEBRIDEMENT FOOT Left 02/24/2020   Procedure: IRRIGATION AND DEBRIDEMENT FOOT;  Surgeon: Rosetta Posner, DPM;  Location: [facility];  Service: Podiatry;  Laterality: Left;   KNEE ARTHROSCOPY     LEFT HEART CATH AND CORS/GRAFTS ANGIOGRAPHY [name] 02/02/2020   Procedure: LEFT HEART CATH AND CORS/GRAFTS ANGIOGRAPHY;  Surgeon: Dalia Heading, MD;  Location: [facility];  Service: Cardiovascular;  Laterality: [name];   LOWER EXTREMITY ANGIOGRAPHY Left 02/25/2020   Procedure: Lower Extremity Angiography;  Surgeon:  Annice Needy, MD;  Location: [facility];  Service: Cardiovascular;  Laterality: Left;   LOWER EXTREMITY ANGIOGRAPHY Left 01/04/2021   Procedure: LOWER EXTREMITY ANGIOGRAPHY;  Surgeon: Annice Needy, MD;  Location: [facility];  Service: Cardiovascular;  Laterality: Left;   LOWER EXTREMITY ANGIOGRAPHY Right 05/21/2022   Procedure: Lower Extremity Angiography;  Surgeon: Annice Needy, MD;  Location: [facility];  Service: Cardiovascular;  Laterality: Right;   METATARSAL HEAD EXCISION Left 01/10/2021   Procedure: METATARSAL HEAD EXCISION - LEFT 5th;  Surgeon: Rosetta Posner, DPM;  Location: [facility];  Service: Podiatry;  Laterality: Left;   PERIPHERAL VASCULAR CATHETERIZATION Right 01/24/2016   Procedure: Lower Extremity Angiography;  Surgeon: Renford Dills, MD;  Location: [facility];  Service: Cardiovascular;  Laterality: Right;   PERIPHERAL VASCULAR CATHETERIZATION Right 01/25/2016   Procedure: Lower Extremity Angiography;  Surgeon: Renford Dills, MD;  Location: [facility];  Service: Cardiovascular;  Laterality: Right;   TOE AMPUTATION     TONSILLECTOMY     Patient Active Problem List   Diagnosis Date Noted   Status post below-knee amputation of right lower extremity (HCC) 05/31/2022   S/P amputation 05/30/2022   Poorly controlled diabetes mellitus (HCC) 05/30/2022   Acute osteomyelitis of right foot (HCC) 05/19/2022   Wound infection 05/18/2022   Ulcer of right ankle, with fat layer exposed (HCC) 05/18/2022   Non-healing wound of lower extremity, initial encounter 05/18/2022   CAD with Hx of CABG 05/18/2022   Chronic respiratory failure with hypoxia (HCC) 05/18/2022   S/P BKA (below knee amputation), left (HCC) 05/18/2022   COPD (chronic obstructive pulmonary disease) (HCC) 05/18/2022   Perinephric hematoma 03/20/2022   Bacteremia due to Klebsiella pneumoniae 03/19/2022   Hyperglycemia 03/16/2022   Septic shock (HCC) 03/16/2022    Sepsis secondary to UTI (HCC) 03/15/2022   Acute hypoxemic respiratory failure (HCC) 02/13/2022   Atrial fibrillation with rapid ventricular response (HCC) 02/11/2022   Uncontrolled type 2 diabetes mellitus with hyperglycemia, with long-term current use of insulin (HCC) 02/11/2022   Dyslipidemia 02/11/2022   Hypothyroidism 02/11/2022   Benign prostatic hyperplasia with lower urinary tract symptoms 02/11/2022   Pressure injury of skin of heel 11/04/2021   UTI (urinary tract infection) 10/22/2021   Iron deficiency anemia 10/19/2021   Hyperkalemia 10/14/2021   Acute urinary retention 10/08/2021   Sepsis due to methicillin resistant Staphylococcus aureus (MRSA) (HCC) 10/04/2021   Acute delirium 10/04/2021   Acute on chronic respiratory failure with hypoxia and hypercapnia (HCC) 10/04/2021   Acute respiratory failure with hypoxia and hypercapnia (HCC) 10/03/2021   OSA (obstructive sleep apnea) 10/03/2021   Malnutrition of moderate degree 09/29/2021   Generalized weakness 09/29/2021   Cellulitis of left foot 08/04/2021   Uncontrolled type 2 diabetes mellitus with hypoglycemia, with long-term current use of insulin (HCC) 08/04/2021   Bilateral lower extremity  edema 08/04/2021   Acute osteomyelitis of lower leg, left (HCC) 08/04/2021   Swelling of limb 07/18/2021   CHF exacerbation (HCC) 05/08/2021   Diabetes mellitus without complication (HCC)    Atrial fibrillation with RVR (HCC)    Obesity, Class III, BMI 40-49.9 (morbid obesity) (HCC)    Venous stasis ulcer (HCC)    Sinus tachycardia    BPH (benign prostatic hyperplasia)    Chronic anticoagulation    Anasarca    Chronic diastolic CHF (congestive heart failure) (HCC)    Venous stasis of both lower extremities    AF (paroxysmal atrial fibrillation) (HCC)    Lower extremity cellulitis 01/03/2021   Chronic osteomyelitis of left foot (HCC) 01/03/2021   Osteomyelitis (HCC) 01/03/2021   Severe sepsis (HCC) 02/24/2020   AKI (acute kidney  injury) (HCC) 02/24/2020   Bilateral cellulitis of lower leg 02/23/2020   CAD (coronary artery disease) 02/02/2020   RLS (restless legs syndrome) 05/15/2019   Statin myopathy 05/15/2019   Diabetes mellitus type 2 in obese 02/13/2016   Essential hypertension 02/13/2016   PAD (peripheral artery disease) (HCC) 02/13/2016   Pressure injury of skin 01/25/2016   Hypoglycemia associated with diabetes (HCC) 12/27/2013   Long-term insulin use (HCC) 12/27/2013   Microalbuminuria 12/27/2013   B12 deficiency 09/29/2013   Obesity 09/29/2013   CAD (coronary artery disease), autologous vein bypass graft 07/20/2013   Hypersomnia with sleep apnea 07/20/2013   Pure hypercholesterolemia 07/20/2013   Osteomyelitis of ankle or foot 07/20/2011    PCP: Dr. Aram Beecham  REFERRING PROVIDER: Sheppard Plumber, NP  REFERRING DIAG: [id number] (ICD-10-CM) - Status post below-knee amputation of right lower extremity (HCC)   THERAPY DIAG:  Difficulty in walking, not elsewhere classified  Muscle weakness (generalized)  Other abnormalities of gait and mobility  Other lack of coordination  Abnormality of gait and mobility  Rationale for Evaluation and Treatment: Rehabilitation  ONSET DATE: 05/24/2022 (date of surgery for latest Below knee amputation- RLE)   SUBJECTIVE:   SUBJECTIVE STATEMENT: Pt reports feeling accomplished- was able to get 2-3  clicks on both prostheses yesterday.    PERTINENT HISTORY: Patient is a 67 year old male with CAD/PAF on Eliquis followed by Cardiology.. Obese with BMI>38. HTN stable on meds. Has HLD not on statin due to myopathy. Also with DM on insulin and thyroid dz on Synthroid. (See above section for all PMH) Patient reports he is eager to be here and a long road to recovery- Reports having his left leg amputated last year (10/03/2021) and most recently his right LE - both below the knee amputations. Reports very dependent with all mobility and states ready to get stronger and  on his feet. States just received his prosthesis yesterday.   Patient using [facility] for prosthetic care PAIN:  Are you having pain? No  PRECAUTIONS: Fall  RED FLAGS: None   WEIGHT BEARING RESTRICTIONS: No  FALLS:  Has patient fallen in last 6 months?  Will ask next visit  LIVING ENVIRONMENT: Lives with: lives with their spouse Lives in: House/apartment Stairs:  split level home - 2 step down to den- ramp for entry in home.  Has following equipment at home: Chiropractor, Environmental consultant - 2 wheeled, Wheelchair (manual), [employer], Ramped entry, and [employer], hoyer lift  OCCUPATION: Psychologist, occupational- retired  PLOF: Independent- prior to 10/03/2021 (left BKA)   PATIENT GOALS: "I want to walk with my prostheses"  NEXT MD VISIT: TBA  OBJECTIVE:   TODAY'S TREATMENT:  DATE: 01/22/23   Therapeutic activities: - assisted patient in donning each Prosthesis with 2 more clicks each.   THEREX:  Seated hip march with 2.5# AW 2 sets of 10 reps Seated knee ext with 2.5 # AW 2 sets of 10 reps Seated hip add with GTB 2 sets of 10 reps Seated hip abd with GTB 2 sets of 10 reps Bicep curl- 9# 2 sets of 10 reps Shoulder press 9# 2 sets of 10 reps    PATIENT EDUCATION:  Exercise technique for HEP   HOME EXERCISE PROGRAM: Access Code: [email or web address] URL: [email or web address] Date: 12/20/2022 Prepared by: Maureen Ralphs  Exercises - Supine Quad Set (BKA)  - 1 x daily - 3 x weekly - 3 sets - 10 reps - Sidelying Hip Abduction wtih Flexion and Extension (BKA)  - 1 x daily - 3 x weekly - 3 sets - 10 reps - Supine Single Leg Bridge with Sound Leg (BKA)  - 1 x daily - 3 x weekly - 3 sets - 10 reps - Supine Hip Extension with Towel Roll (BKA)  - 1 x daily - 3 x weekly - 3 sets - 10 reps - Hooklying Clamshell with Resistance  - 1 x daily - 3 x weekly - 3 sets  - 10 reps    -determination of a schedule at home for wearing of prosthese and shrinkers; recommended on 3x daily for prostheses with ad lib increase in time. -discussed pros and cons of shrink use/disuse overnight   ASSESSMENT:  CLINICAL IMPRESSION: Patient presents with good effort today. He was able to don legs better. Overall- good response to LE Strengthening with fatigue as limiting factor today. He has difficulty with extending Right LE more than left but able to complete reps today. He will benefit from skilled PT services to improve his overall LE strength, transfer ability, and possibly able to return to walking with prosthetic training for improved mobility and quality of life.    OBJECTIVE IMPAIRMENTS: decreased activity tolerance, decreased balance, decreased endurance, difficulty walking, decreased strength, and obesity.   ACTIVITY LIMITATIONS: carrying, lifting, bending, standing, squatting, stairs, transfers, bed mobility, bathing, toileting, and dressing  PARTICIPATION LIMITATIONS: cleaning, driving, shopping, community activity, occupation, and yard work  PERSONAL FACTORS: Time since onset of injury/illness/exacerbation and 3+ comorbidities: DM, COPD, PAD  are also affecting patient's functional outcome.   REHAB POTENTIAL: Good  CLINICAL DECISION MAKING: Evolving/moderate complexity  EVALUATION COMPLEXITY: Moderate   GOALS: Goals reviewed with patient? Yes  SHORT TERM GOALS: Target date: 01/09/2023  LONG TERM GOALS: Target date: 02/20/2023   1.  Patient will demonstrate modified independent with all bed mobility (sup<-> sit)  for optimal function at home  Baseline: EVAL - Will assess 2nd visit; 01/08/2023- patient able to roll with assist of wife Goal status: ONGOING  2.  Patient will increase FOTO score to equal to or greater than  52 to demonstrate statistically significant improvement in mobility and quality of life.  Baseline: EVAL = 41 Goal status:  INITIAL   3.  Patient will demonstrate all slide board transfers with Min assist (Bed <-> chair)   Baseline: EVAL= Patient unable to transfer from chair to bed due to increased musle weakness; 01/08/2023- Patient continues to require hoyer lift for all transfers Goal status: INITIAL       PLAN:  PT FREQUENCY: 1-2x/week  PT DURATION: 12 weeks  PLANNED INTERVENTIONS: Therapeutic exercises, Therapeutic activity, Neuromuscular re-education, Balance training, Gait training, Patient/Family education, Self Care, Joint mobilization,  Joint manipulation, Vestibular training, Canalith repositioning, Prosthetic training, DME instructions, Dry Needling, Electrical stimulation, Wheelchair mobility training, Spinal manipulation, Spinal mobilization, Cryotherapy, Moist heat, Compression bandaging, scar mobilization, Manual therapy, and Re-evaluation  PLAN FOR NEXT SESSION:  Continue to progress LE strengthening and therapeutic activities/prosthetic training. Progress note next visit.   1:20 PM, 01/22/23 Louis Meckel, PT Physical Therapist - [facility]  [facility] [phone]     Lenda Kelp, PT 01/22/2023, 1:20 PM

## 2023-01-22 ENCOUNTER — Other Ambulatory Visit: Payer: Self-pay

## 2023-01-24 ENCOUNTER — Ambulatory Visit: Payer: Medicare Other

## 2023-01-24 DIAGNOSIS — R262 Difficulty in walking, not elsewhere classified: Secondary | ICD-10-CM | POA: Diagnosis not present

## 2023-01-24 DIAGNOSIS — M6281 Muscle weakness (generalized): Secondary | ICD-10-CM

## 2023-01-24 DIAGNOSIS — R2689 Other abnormalities of gait and mobility: Secondary | ICD-10-CM

## 2023-01-24 DIAGNOSIS — R269 Unspecified abnormalities of gait and mobility: Secondary | ICD-10-CM

## 2023-01-24 DIAGNOSIS — R2681 Unsteadiness on feet: Secondary | ICD-10-CM

## 2023-01-24 DIAGNOSIS — R278 Other lack of coordination: Secondary | ICD-10-CM

## 2023-01-24 NOTE — Therapy (Signed)
OUTPATIENT PHYSICAL THERAPY TREATMENT    Patient Name: [name]. MRN: [id number] DOB:Jun 22, 1955, 67 y.o., male Today's Date: 01/24/2023  END OF SESSION:  PT End of Session - 01/24/23 1646     Visit Number 12    Number of Visits 24    Date for PT Re-Evaluation 02/20/23    Authorization Type [employer]BS Medicare    Progress Note Due on Visit 20    PT Start Time 1315    PT Stop Time 1400    PT Time Calculation (min) 45 min    Equipment Utilized During Treatment Gait belt    Activity Tolerance Patient tolerated treatment well;No increased pain;Patient limited by fatigue    Behavior During Therapy [facility] for tasks assessed/performed                   Past Medical History:  Diagnosis Date   Arrhythmia    atrial fibrillation   Asthma    CHF (congestive heart failure) (HCC)    COPD (chronic obstructive pulmonary disease) (HCC)    Coronary artery disease    Depression    Diabetes mellitus without complication (HCC)    Gout    History anabolic steroid use    Hyperlipidemia    Hypertension    Hypogonadism in male    MI (myocardial infarction) (HCC)    Morbid obesity (HCC)    Myocardial infarction (HCC)    Peripheral vascular disease (HCC)    Perirectal abscess    Pleurisy    Sleep apnea    CPAP at night, no oxygen   Varicella    Past Surgical History:  Procedure Laterality Date   ABDOMINAL AORTIC ANEURYSM REPAIR     ACHILLES TENDON SURGERY Left 01/10/2021   Procedure: ACHILLES LENGTHENING/KIDNER;  Surgeon: Rosetta Posner, DPM;  Location: [facility];  Service: Podiatry;  Laterality: Left;   AMPUTATION Left 10/03/2021   Procedure: AMPUTATION BELOW KNEE;  Surgeon: Renford Dills, MD;  Location: [facility];  Service: Vascular;  Laterality: Left;   AMPUTATION Right 05/24/2022   Procedure: AMPUTATION BELOW KNEE;  Surgeon: Annice Needy, MD;  Location: [facility];  Service: General;  Laterality: Right;   AMPUTATION TOE Right 02/10/2016   Procedure: AMPUTATION TOE 3RD  TOE;  Surgeon: Gwyneth Revels, DPM;  Location: [facility];  Service: Podiatry;  Laterality: Right;   AMPUTATION TOE Left 02/24/2020   Procedure: AMPUTATION TOE;  Surgeon: Rosetta Posner, DPM;  Location: [facility];  Service: Podiatry;  Laterality: Left;   APPLICATION OF WOUND VAC Left 02/29/2020   Procedure: APPLICATION OF WOUND VAC;  Surgeon: Rosetta Posner, DPM;  Location: [facility];  Service: Podiatry;  Laterality: Left;   CARDIAC CATHETERIZATION     CARDIOVERSION [name] 02/13/2022   Procedure: CARDIOVERSION;  Surgeon: Lamar Blinks, MD;  Location: [facility];  Service: Cardiovascular;  Laterality: [name];   COLONOSCOPY WITH PROPOFOL [name] 11/18/2015   Procedure: COLONOSCOPY WITH PROPOFOL;  Surgeon: Scot Jun, MD;  Location: [facility];  Service: Endoscopy;  Laterality: [name];   CORONARY ARTERY BYPASS GRAFT     CORONARY STENT INTERVENTION [name] 02/02/2020   Procedure: CORONARY STENT INTERVENTION;  Surgeon: Marcina Millard, MD;  Location: [facility];  Service: Cardiovascular;  Laterality: [name];   INCISION AND DRAINAGE Left 08/07/2021   Procedure: INCISION AND DRAINAGE-Partial Calcanectomy;  Surgeon: Rosetta Posner, DPM;  Location: [facility];  Service: Podiatry;  Laterality: Left;   IRRIGATION AND DEBRIDEMENT FOOT Left 02/29/2020   Procedure: IRRIGATION AND DEBRIDEMENT FOOT;  Surgeon: Rosetta Posner, DPM;  Location: [facility];  Service: Podiatry;  Laterality: Left;   IRRIGATION AND DEBRIDEMENT FOOT Left 02/24/2020   Procedure: IRRIGATION AND DEBRIDEMENT FOOT;  Surgeon: Rosetta Posner, DPM;  Location: [facility];  Service: Podiatry;  Laterality: Left;   KNEE ARTHROSCOPY     LEFT HEART CATH AND CORS/GRAFTS ANGIOGRAPHY [name] 02/02/2020   Procedure: LEFT HEART CATH AND CORS/GRAFTS ANGIOGRAPHY;  Surgeon: Dalia Heading, MD;  Location: [facility];  Service: Cardiovascular;  Laterality: [name];   LOWER EXTREMITY ANGIOGRAPHY Left 02/25/2020   Procedure: Lower Extremity Angiography;  Surgeon:  Annice Needy, MD;  Location: [facility];  Service: Cardiovascular;  Laterality: Left;   LOWER EXTREMITY ANGIOGRAPHY Left 01/04/2021   Procedure: LOWER EXTREMITY ANGIOGRAPHY;  Surgeon: Annice Needy, MD;  Location: [facility];  Service: Cardiovascular;  Laterality: Left;   LOWER EXTREMITY ANGIOGRAPHY Right 05/21/2022   Procedure: Lower Extremity Angiography;  Surgeon: Annice Needy, MD;  Location: [facility];  Service: Cardiovascular;  Laterality: Right;   METATARSAL HEAD EXCISION Left 01/10/2021   Procedure: METATARSAL HEAD EXCISION - LEFT 5th;  Surgeon: Rosetta Posner, DPM;  Location: [facility];  Service: Podiatry;  Laterality: Left;   PERIPHERAL VASCULAR CATHETERIZATION Right 01/24/2016   Procedure: Lower Extremity Angiography;  Surgeon: Renford Dills, MD;  Location: [facility];  Service: Cardiovascular;  Laterality: Right;   PERIPHERAL VASCULAR CATHETERIZATION Right 01/25/2016   Procedure: Lower Extremity Angiography;  Surgeon: Renford Dills, MD;  Location: [facility];  Service: Cardiovascular;  Laterality: Right;   TOE AMPUTATION     TONSILLECTOMY     Patient Active Problem List   Diagnosis Date Noted   Status post below-knee amputation of right lower extremity (HCC) 05/31/2022   S/P amputation 05/30/2022   Poorly controlled diabetes mellitus (HCC) 05/30/2022   Acute osteomyelitis of right foot (HCC) 05/19/2022   Wound infection 05/18/2022   Ulcer of right ankle, with fat layer exposed (HCC) 05/18/2022   Non-healing wound of lower extremity, initial encounter 05/18/2022   CAD with Hx of CABG 05/18/2022   Chronic respiratory failure with hypoxia (HCC) 05/18/2022   S/P BKA (below knee amputation), left (HCC) 05/18/2022   COPD (chronic obstructive pulmonary disease) (HCC) 05/18/2022   Perinephric hematoma 03/20/2022   Bacteremia due to Klebsiella pneumoniae 03/19/2022   Hyperglycemia 03/16/2022   Septic shock (HCC) 03/16/2022    Sepsis secondary to UTI (HCC) 03/15/2022   Acute hypoxemic respiratory failure (HCC) 02/13/2022   Atrial fibrillation with rapid ventricular response (HCC) 02/11/2022   Uncontrolled type 2 diabetes mellitus with hyperglycemia, with long-term current use of insulin (HCC) 02/11/2022   Dyslipidemia 02/11/2022   Hypothyroidism 02/11/2022   Benign prostatic hyperplasia with lower urinary tract symptoms 02/11/2022   Pressure injury of skin of heel 11/04/2021   UTI (urinary tract infection) 10/22/2021   Iron deficiency anemia 10/19/2021   Hyperkalemia 10/14/2021   Acute urinary retention 10/08/2021   Sepsis due to methicillin resistant Staphylococcus aureus (MRSA) (HCC) 10/04/2021   Acute delirium 10/04/2021   Acute on chronic respiratory failure with hypoxia and hypercapnia (HCC) 10/04/2021   Acute respiratory failure with hypoxia and hypercapnia (HCC) 10/03/2021   OSA (obstructive sleep apnea) 10/03/2021   Malnutrition of moderate degree 09/29/2021   Generalized weakness 09/29/2021   Cellulitis of left foot 08/04/2021   Uncontrolled type 2 diabetes mellitus with hypoglycemia, with long-term current use of insulin (HCC) 08/04/2021   Bilateral lower extremity  edema 08/04/2021   Acute osteomyelitis of lower leg, left (HCC) 08/04/2021   Swelling of limb 07/18/2021   CHF exacerbation (HCC) 05/08/2021   Diabetes mellitus without complication (HCC)    Atrial fibrillation with RVR (HCC)    Obesity, Class III, BMI 40-49.9 (morbid obesity) (HCC)    Venous stasis ulcer (HCC)    Sinus tachycardia    BPH (benign prostatic hyperplasia)    Chronic anticoagulation    Anasarca    Chronic diastolic CHF (congestive heart failure) (HCC)    Venous stasis of both lower extremities    AF (paroxysmal atrial fibrillation) (HCC)    Lower extremity cellulitis 01/03/2021   Chronic osteomyelitis of left foot (HCC) 01/03/2021   Osteomyelitis (HCC) 01/03/2021   Severe sepsis (HCC) 02/24/2020   AKI (acute kidney  injury) (HCC) 02/24/2020   Bilateral cellulitis of lower leg 02/23/2020   CAD (coronary artery disease) 02/02/2020   RLS (restless legs syndrome) 05/15/2019   Statin myopathy 05/15/2019   Diabetes mellitus type 2 in obese 02/13/2016   Essential hypertension 02/13/2016   PAD (peripheral artery disease) (HCC) 02/13/2016   Pressure injury of skin 01/25/2016   Hypoglycemia associated with diabetes (HCC) 12/27/2013   Long-term insulin use (HCC) 12/27/2013   Microalbuminuria 12/27/2013   B12 deficiency 09/29/2013   Obesity 09/29/2013   CAD (coronary artery disease), autologous vein bypass graft 07/20/2013   Hypersomnia with sleep apnea 07/20/2013   Pure hypercholesterolemia 07/20/2013   Osteomyelitis of ankle or foot 07/20/2011    PCP: Dr. Aram Beecham  REFERRING PROVIDER: Sheppard Plumber, NP  REFERRING DIAG: [phone] (ICD-10-CM) - Status post below-knee amputation of right lower extremity (HCC)   THERAPY DIAG:  Difficulty in walking, not elsewhere classified  Muscle weakness (generalized)  Other abnormalities of gait and mobility  Other lack of coordination  Abnormality of gait and mobility  Unsteadiness on feet  Rationale for Evaluation and Treatment: Rehabilitation  ONSET DATE: 05/24/2022 (date of surgery for latest Below knee amputation- RLE)   SUBJECTIVE:   SUBJECTIVE STATEMENT: Patient reports feeling okay today- states his wife was able to assist in donning his prostheses.     PERTINENT HISTORY: Patient is a 67 year old male with CAD/PAF on Eliquis followed by Cardiology.. Obese with BMI>38. HTN stable on meds. Has HLD not on statin due to myopathy. Also with DM on insulin and thyroid dz on Synthroid. (See above section for all PMH) Patient reports he is eager to be here and a long road to recovery- Reports having his left leg amputated last year (10/03/2021) and most recently his right LE - both below the knee amputations. Reports very dependent with all mobility and  states ready to get stronger and on his feet. States just received his prosthesis yesterday.   Patient using [facility] for prosthetic care PAIN:  Are you having pain? No  PRECAUTIONS: Fall  RED FLAGS: None   WEIGHT BEARING RESTRICTIONS: No  FALLS:  Has patient fallen in last 6 months?  Will ask next visit  LIVING ENVIRONMENT: Lives with: lives with their spouse Lives in: House/apartment Stairs:  split level home - 2 step down to den- ramp for entry in home.  Has following equipment at home: Chiropractor, Environmental consultant - 2 wheeled, Wheelchair (manual), [employer], Ramped entry, and [employer], hoyer lift  OCCUPATION: Psychologist, occupational- retired  PLOF: Independent- prior to 10/03/2021 (left BKA)   PATIENT GOALS: "I want to walk with my prostheses"  NEXT MD VISIT: TBA  OBJECTIVE:  TODAY'S TREATMENT:                                                                                                                              DATE: 01/24/23   Therapeutic activities: - assisted patient in donning each Prosthesis with 1-2 more clicks each.  Hoyer lift from manual w/c to sitting at edge of mat Patient worked on positioning LE (flexing knees to put more weight through heels- Increased difficulty on right LE due to alignment of prosthesis).   Sit to stand using bariatric RW in front and raising height of mat table- Max A+1 - patient able to stand with increased difficulty-(partially flexed knees and unable to extend glutes) x approx 25 sec   Utilized Sit to stand lift and patient able to static stand x 3 trials of 3+ min each- no complaint of any pain- VC to try to extend gluteals- Knees well blocked - working on standing symmetry- PT moved feet for most upright position.   THEREX:  Seated hip march with  2 sets of 10 reps Seated knee ext with 2 sets of 10 reps     PATIENT EDUCATION:  Exercise technique for HEP   HOME EXERCISE PROGRAM: Access Code: [email or web address] URL:  [email or web address] Date: 12/20/2022 Prepared by: Maureen Ralphs  Exercises - Supine Quad Set (BKA)  - 1 x daily - 3 x weekly - 3 sets - 10 reps - Sidelying Hip Abduction wtih Flexion and Extension (BKA)  - 1 x daily - 3 x weekly - 3 sets - 10 reps - Supine Single Leg Bridge with Sound Leg (BKA)  - 1 x daily - 3 x weekly - 3 sets - 10 reps - Supine Hip Extension with Towel Roll (BKA)  - 1 x daily - 3 x weekly - 3 sets - 10 reps - Hooklying Clamshell with Resistance  - 1 x daily - 3 x weekly - 3 sets - 10 reps    -determination of a schedule at home for wearing of prosthese and shrinkers; recommended on 3x daily for prostheses with ad lib increase in time. -discussed pros and cons of shrink use/disuse overnight   ASSESSMENT:  CLINICAL IMPRESSION: Patient able to stand again today and even stood once with assist of raised mat table and use of max A of PT. He was able to donn prosthesis better today prior to arrival and abel to work on standing today. No pain reported with standing.  He will benefit from skilled PT services to improve his overall LE strength, transfer ability, and possibly able to return to walking with prosthetic training for improved mobility and quality of life.    OBJECTIVE IMPAIRMENTS: decreased activity tolerance, decreased balance, decreased endurance, difficulty walking, decreased strength, and obesity.   ACTIVITY LIMITATIONS: carrying, lifting, bending, standing, squatting, stairs, transfers, bed mobility, bathing, toileting, and dressing  PARTICIPATION LIMITATIONS: cleaning, driving, shopping, community activity, occupation, and yard work  PERSONAL FACTORS: Time since  onset of injury/illness/exacerbation and 3+ comorbidities: DM, COPD, PAD  are also affecting patient's functional outcome.   REHAB POTENTIAL: Good  CLINICAL DECISION MAKING: Evolving/moderate complexity  EVALUATION COMPLEXITY: Moderate   GOALS: Goals reviewed with  patient? Yes  SHORT TERM GOALS: Target date: 01/09/2023  LONG TERM GOALS: Target date: 02/20/2023   1.  Patient will demonstrate modified independent with all bed mobility (sup<-> sit)  for optimal function at home  Baseline: EVAL - Will assess 2nd visit; 01/08/2023- patient able to roll with assist of wife Goal status: ONGOING  2.  Patient will increase FOTO score to equal to or greater than  52 to demonstrate statistically significant improvement in mobility and quality of life.  Baseline: EVAL = 41 Goal status: INITIAL   3.  Patient will demonstrate all slide board transfers with Min assist (Bed <-> chair)   Baseline: EVAL= Patient unable to transfer from chair to bed due to increased musle weakness; 01/08/2023- Patient continues to require hoyer lift for all transfers Goal status: INITIAL       PLAN:  PT FREQUENCY: 1-2x/week  PT DURATION: 12 weeks  PLANNED INTERVENTIONS: Therapeutic exercises, Therapeutic activity, Neuromuscular re-education, Balance training, Gait training, Patient/Family education, Self Care, Joint mobilization, Joint manipulation, Vestibular training, Canalith repositioning, Prosthetic training, DME instructions, Dry Needling, Electrical stimulation, Wheelchair mobility training, Spinal manipulation, Spinal mobilization, Cryotherapy, Moist heat, Compression bandaging, scar mobilization, Manual therapy, and Re-evaluation  PLAN FOR NEXT SESSION:  Continue to progress LE strengthening and therapeutic activities/prosthetic training. Judy Pimple, [facility] to join session next visit to assess patients prostheses.   4:47 PM, 01/24/23 Louis Meckel, PT Physical Therapist - [facility]  [facility] [phone]     Lenda Kelp, PT 01/24/2023, 4:47 PM

## 2023-01-28 ENCOUNTER — Ambulatory Visit: Payer: Medicare Other

## 2023-01-28 DIAGNOSIS — M6281 Muscle weakness (generalized): Secondary | ICD-10-CM

## 2023-01-28 DIAGNOSIS — R269 Unspecified abnormalities of gait and mobility: Secondary | ICD-10-CM

## 2023-01-28 DIAGNOSIS — R262 Difficulty in walking, not elsewhere classified: Secondary | ICD-10-CM

## 2023-01-28 DIAGNOSIS — R2681 Unsteadiness on feet: Secondary | ICD-10-CM

## 2023-01-28 DIAGNOSIS — R2689 Other abnormalities of gait and mobility: Secondary | ICD-10-CM

## 2023-01-28 DIAGNOSIS — R278 Other lack of coordination: Secondary | ICD-10-CM

## 2023-01-28 NOTE — Therapy (Signed)
OUTPATIENT PHYSICAL THERAPY TREATMENT    Patient Name: [name]. MRN: [id number] DOB:Apr 25, 1956, 67 y.o., male Today's Date: 01/28/2023  END OF SESSION:  PT End of Session - 01/28/23 1617     Visit Number 13    Number of Visits 24    Date for PT Re-Evaluation 02/20/23    Authorization Type BCBS Medicare    Progress Note Due on Visit 20    PT Start Time 1400    PT Stop Time 1441    PT Time Calculation (min) 41 min    Equipment Utilized During Treatment Gait belt    Activity Tolerance Patient tolerated treatment well;No increased pain;Patient limited by fatigue    Behavior During Therapy [facility] for tasks assessed/performed                    Past Medical History:  Diagnosis Date   Arrhythmia    atrial fibrillation   Asthma    CHF (congestive heart failure) (HCC)    COPD (chronic obstructive pulmonary disease) (HCC)    Coronary artery disease    Depression    Diabetes mellitus without complication (HCC)    Gout    History anabolic steroid use    Hyperlipidemia    Hypertension    Hypogonadism in male    MI (myocardial infarction) (HCC)    Morbid obesity (HCC)    Myocardial infarction (HCC)    Peripheral vascular disease (HCC)    Perirectal abscess    Pleurisy    Sleep apnea    CPAP at night, no oxygen   Varicella    Past Surgical History:  Procedure Laterality Date   ABDOMINAL AORTIC ANEURYSM REPAIR     ACHILLES TENDON SURGERY Left 01/10/2021   Procedure: ACHILLES LENGTHENING/KIDNER;  Surgeon: Rosetta Posner, DPM;  Location: [facility];  Service: Podiatry;  Laterality: Left;   AMPUTATION Left 10/03/2021   Procedure: AMPUTATION BELOW KNEE;  Surgeon: Renford Dills, MD;  Location: [facility];  Service: Vascular;  Laterality: Left;   AMPUTATION Right 05/24/2022   Procedure: AMPUTATION BELOW KNEE;  Surgeon: Annice Needy, MD;  Location: [facility];  Service: General;  Laterality: Right;   AMPUTATION TOE Right 02/10/2016   Procedure: AMPUTATION TOE  3RD TOE;  Surgeon: Gwyneth Revels, DPM;  Location: [facility];  Service: Podiatry;  Laterality: Right;   AMPUTATION TOE Left 02/24/2020   Procedure: AMPUTATION TOE;  Surgeon: Rosetta Posner, DPM;  Location: [facility];  Service: Podiatry;  Laterality: Left;   APPLICATION OF WOUND VAC Left 02/29/2020   Procedure: APPLICATION OF WOUND VAC;  Surgeon: Rosetta Posner, DPM;  Location: [facility];  Service: Podiatry;  Laterality: Left;   CARDIAC CATHETERIZATION     CARDIOVERSION [name] 02/13/2022   Procedure: CARDIOVERSION;  Surgeon: Lamar Blinks, MD;  Location: [facility];  Service: Cardiovascular;  Laterality: [name];   COLONOSCOPY WITH PROPOFOL [name] 11/18/2015   Procedure: COLONOSCOPY WITH PROPOFOL;  Surgeon: Scot Jun, MD;  Location: [facility];  Service: Endoscopy;  Laterality: [name];   CORONARY ARTERY BYPASS GRAFT     CORONARY STENT INTERVENTION [name] 02/02/2020   Procedure: CORONARY STENT INTERVENTION;  Surgeon: Marcina Millard, MD;  Location: [facility];  Service: Cardiovascular;  Laterality: [name];   INCISION AND DRAINAGE Left 08/07/2021   Procedure: INCISION AND DRAINAGE-Partial Calcanectomy;  Surgeon: Rosetta Posner, DPM;  Location: [facility];  Service: Podiatry;  Laterality: Left;   IRRIGATION AND DEBRIDEMENT FOOT Left 02/29/2020   Procedure: IRRIGATION AND DEBRIDEMENT  FOOT;  Surgeon: Rosetta Posner, DPM;  Location: [facility];  Service: Podiatry;  Laterality: Left;   IRRIGATION AND DEBRIDEMENT FOOT Left 02/24/2020   Procedure: IRRIGATION AND DEBRIDEMENT FOOT;  Surgeon: Rosetta Posner, DPM;  Location: [facility];  Service: Podiatry;  Laterality: Left;   KNEE ARTHROSCOPY     LEFT HEART CATH AND CORS/GRAFTS ANGIOGRAPHY [name] 02/02/2020   Procedure: LEFT HEART CATH AND CORS/GRAFTS ANGIOGRAPHY;  Surgeon: Dalia Heading, MD;  Location: [facility];  Service: Cardiovascular;  Laterality: [name];   LOWER EXTREMITY ANGIOGRAPHY Left 02/25/2020   Procedure: Lower Extremity Angiography;   Surgeon: Annice Needy, MD;  Location: [facility];  Service: Cardiovascular;  Laterality: Left;   LOWER EXTREMITY ANGIOGRAPHY Left 01/04/2021   Procedure: LOWER EXTREMITY ANGIOGRAPHY;  Surgeon: Annice Needy, MD;  Location: [facility];  Service: Cardiovascular;  Laterality: Left;   LOWER EXTREMITY ANGIOGRAPHY Right 05/21/2022   Procedure: Lower Extremity Angiography;  Surgeon: Annice Needy, MD;  Location: [facility];  Service: Cardiovascular;  Laterality: Right;   METATARSAL HEAD EXCISION Left 01/10/2021   Procedure: METATARSAL HEAD EXCISION - LEFT 5th;  Surgeon: Rosetta Posner, DPM;  Location: [facility];  Service: Podiatry;  Laterality: Left;   PERIPHERAL VASCULAR CATHETERIZATION Right 01/24/2016   Procedure: Lower Extremity Angiography;  Surgeon: Renford Dills, MD;  Location: [facility];  Service: Cardiovascular;  Laterality: Right;   PERIPHERAL VASCULAR CATHETERIZATION Right 01/25/2016   Procedure: Lower Extremity Angiography;  Surgeon: Renford Dills, MD;  Location: [facility];  Service: Cardiovascular;  Laterality: Right;   TOE AMPUTATION     TONSILLECTOMY     Patient Active Problem List   Diagnosis Date Noted   Status post below-knee amputation of right lower extremity (HCC) 05/31/2022   S/P amputation 05/30/2022   Poorly controlled diabetes mellitus (HCC) 05/30/2022   Acute osteomyelitis of right foot (HCC) 05/19/2022   Wound infection 05/18/2022   Ulcer of right ankle, with fat layer exposed (HCC) 05/18/2022   Non-healing wound of lower extremity, initial encounter 05/18/2022   CAD with Hx of CABG 05/18/2022   Chronic respiratory failure with hypoxia (HCC) 05/18/2022   S/P BKA (below knee amputation), left (HCC) 05/18/2022   COPD (chronic obstructive pulmonary disease) (HCC) 05/18/2022   Perinephric hematoma 03/20/2022   Bacteremia due to Klebsiella pneumoniae 03/19/2022   Hyperglycemia 03/16/2022   Septic shock (HCC)  03/16/2022   Sepsis secondary to UTI (HCC) 03/15/2022   Acute hypoxemic respiratory failure (HCC) 02/13/2022   Atrial fibrillation with rapid ventricular response (HCC) 02/11/2022   Uncontrolled type 2 diabetes mellitus with hyperglycemia, with long-term current use of insulin (HCC) 02/11/2022   Dyslipidemia 02/11/2022   Hypothyroidism 02/11/2022   Benign prostatic hyperplasia with lower urinary tract symptoms 02/11/2022   Pressure injury of skin of heel 11/04/2021   UTI (urinary tract infection) 10/22/2021   Iron deficiency anemia 10/19/2021   Hyperkalemia 10/14/2021   Acute urinary retention 10/08/2021   Sepsis due to methicillin resistant Staphylococcus aureus (MRSA) (HCC) 10/04/2021   Acute delirium 10/04/2021   Acute on chronic respiratory failure with hypoxia and hypercapnia (HCC) 10/04/2021   Acute respiratory failure with hypoxia and hypercapnia (HCC) 10/03/2021   OSA (obstructive sleep apnea) 10/03/2021   Malnutrition of moderate degree 09/29/2021   Generalized weakness 09/29/2021   Cellulitis of left foot 08/04/2021   Uncontrolled type 2 diabetes mellitus with hypoglycemia, with long-term current use of insulin (HCC) 08/04/2021   Bilateral  lower extremity edema 08/04/2021   Acute osteomyelitis of lower leg, left (HCC) 08/04/2021   Swelling of limb 07/18/2021   CHF exacerbation (HCC) 05/08/2021   Diabetes mellitus without complication (HCC)    Atrial fibrillation with RVR (HCC)    Obesity, Class III, BMI 40-49.9 (morbid obesity) (HCC)    Venous stasis ulcer (HCC)    Sinus tachycardia    BPH (benign prostatic hyperplasia)    Chronic anticoagulation    Anasarca    Chronic diastolic CHF (congestive heart failure) (HCC)    Venous stasis of both lower extremities    AF (paroxysmal atrial fibrillation) (HCC)    Lower extremity cellulitis 01/03/2021   Chronic osteomyelitis of left foot (HCC) 01/03/2021   Osteomyelitis (HCC) 01/03/2021   Severe sepsis (HCC) 02/24/2020   AKI  (acute kidney injury) (HCC) 02/24/2020   Bilateral cellulitis of lower leg 02/23/2020   CAD (coronary artery disease) 02/02/2020   RLS (restless legs syndrome) 05/15/2019   Statin myopathy 05/15/2019   Diabetes mellitus type 2 in obese 02/13/2016   Essential hypertension 02/13/2016   PAD (peripheral artery disease) (HCC) 02/13/2016   Pressure injury of skin 01/25/2016   Hypoglycemia associated with diabetes (HCC) 12/27/2013   Long-term insulin use (HCC) 12/27/2013   Microalbuminuria 12/27/2013   B12 deficiency 09/29/2013   Obesity 09/29/2013   CAD (coronary artery disease), autologous vein bypass graft 07/20/2013   Hypersomnia with sleep apnea 07/20/2013   Pure hypercholesterolemia 07/20/2013   Osteomyelitis of ankle or foot 07/20/2011    PCP: Dr. Aram Beecham  REFERRING PROVIDER: Sheppard Plumber, NP  REFERRING DIAG: [phone] (ICD-10-CM) - Status post below-knee amputation of right lower extremity (HCC)   THERAPY DIAG:  Difficulty in walking, not elsewhere classified  Muscle weakness (generalized)  Other abnormalities of gait and mobility  Other lack of coordination  Abnormality of gait and mobility  Unsteadiness on feet  Rationale for Evaluation and Treatment: Rehabilitation  ONSET DATE: 05/24/2022 (date of surgery for latest Below knee amputation- RLE)   SUBJECTIVE:   SUBJECTIVE STATEMENT: Patient reports exhibiting more low back pain over the weekend and today. States otherwise doing okay- Reports slight more difficulty donning prostheses today.      PERTINENT HISTORY: Patient is a 67 year old male with CAD/PAF on Eliquis followed by Cardiology.. Obese with BMI>38. HTN stable on meds. Has HLD not on statin due to myopathy. Also with DM on insulin and thyroid dz on Synthroid. (See above section for all PMH) Patient reports he is eager to be here and a long road to recovery- Reports having his left leg amputated last year (10/03/2021) and most recently his right LE -  both below the knee amputations. Reports very dependent with all mobility and states ready to get stronger and on his feet. States just received his prosthesis yesterday.   Patient using [facility] for prosthetic care PAIN:  Are you having pain? No  PRECAUTIONS: Fall  RED FLAGS: None   WEIGHT BEARING RESTRICTIONS: No  FALLS:  Has patient fallen in last 6 months?  Will ask next visit  LIVING ENVIRONMENT: Lives with: lives with their spouse Lives in: House/apartment Stairs:  split level home - 2 step down to den- ramp for entry in home.  Has following equipment at home: Quad cane large base, Environmental consultant - 2 wheeled, Wheelchair (manual), [employer], Ramped entry, and [employer], hoyer lift  OCCUPATION: Psychologist, occupational- retired  PLOF: Independent- prior to 10/03/2021 (left BKA)   PATIENT GOALS: "I want to walk with  my prostheses"  NEXT MD VISIT: TBA  OBJECTIVE:   TODAY'S TREATMENT:                                                                                                                              DATE: 01/28/23     MIKE NEAL, CPO with [facility] present for today's session to observe/assess each prosthesis fit and watch patient stand to see if any modifications need to be made.    Therapeutic activities: - assisted patient in donning each Prosthesis with 1-2 more clicks each.  Hoyer lift from manual w/c to sitting at edge of mat Some assistance required to position LE (flexing knees to put more weight through heels- Increased difficulty on right LE due to alignment of prosthesis).    Patient performed static standing initially with use of Sit to stand lift and patient able to static stand x 3 trials of 3+ min each- no complaint of any pain- VC to try to extend gluteals-  Patient also able to laterally shift weight -  and take weight off each LE and minimal able to move each leg within safety of confines of knee guard of stander.    Per Kathlene November- Fit looked okay but would like  to come back once patient up walking.   PATIENT EDUCATION:  Exercise technique for HEP   HOME EXERCISE PROGRAM: Access Code: [email or web address] URL: [email or web address] Date: 12/20/2022 Prepared by: Maureen Ralphs  Exercises - Supine Quad Set (BKA)  - 1 x daily - 3 x weekly - 3 sets - 10 reps - Sidelying Hip Abduction wtih Flexion and Extension (BKA)  - 1 x daily - 3 x weekly - 3 sets - 10 reps - Supine Single Leg Bridge with Sound Leg (BKA)  - 1 x daily - 3 x weekly - 3 sets - 10 reps - Supine Hip Extension with Towel Roll (BKA)  - 1 x daily - 3 x weekly - 3 sets - 10 reps - Hooklying Clamshell with Resistance  - 1 x daily - 3 x weekly - 3 sets - 10 reps    -determination of a schedule at home for wearing of prosthese and shrinkers; recommended on 3x daily for prostheses with ad lib increase in time. -discussed pros and cons of shrink use/disuse overnight   ASSESSMENT:  CLINICAL IMPRESSION: Patient presents with good motivation despite experiencing some low back pain. He was able to don his prosthesis partially and PT assisted the remaining 2 clicks. He was able to stand with assist of lift today but once in standing- very minimal UE support today. No obvious issues with prosthesis today.  He will benefit from skilled PT services to improve his overall LE strength, transfer ability, and possibly able to return to walking with prosthetic training for improved mobility and quality of life.    OBJECTIVE IMPAIRMENTS: decreased activity tolerance, decreased balance, decreased endurance, difficulty walking, decreased strength, and obesity.   ACTIVITY  LIMITATIONS: carrying, lifting, bending, standing, squatting, stairs, transfers, bed mobility, bathing, toileting, and dressing  PARTICIPATION LIMITATIONS: cleaning, driving, shopping, community activity, occupation, and yard work  PERSONAL FACTORS: Time since onset of injury/illness/exacerbation and 3+ comorbidities: DM,  COPD, PAD  are also affecting patient's functional outcome.   REHAB POTENTIAL: Good  CLINICAL DECISION MAKING: Evolving/moderate complexity  EVALUATION COMPLEXITY: Moderate   GOALS: Goals reviewed with patient? Yes  SHORT TERM GOALS: Target date: 01/09/2023  LONG TERM GOALS: Target date: 02/20/2023   1.  Patient will demonstrate modified independent with all bed mobility (sup<-> sit)  for optimal function at home  Baseline: EVAL - Will assess 2nd visit; 01/08/2023- patient able to roll with assist of wife Goal status: ONGOING  2.  Patient will increase FOTO score to equal to or greater than  52 to demonstrate statistically significant improvement in mobility and quality of life.  Baseline: EVAL = 41 Goal status: INITIAL   3.  Patient will demonstrate all slide board transfers with Min assist (Bed <-> chair)   Baseline: EVAL= Patient unable to transfer from chair to bed due to increased musle weakness; 01/08/2023- Patient continues to require hoyer lift for all transfers Goal status: INITIAL       PLAN:  PT FREQUENCY: 1-2x/week  PT DURATION: 12 weeks  PLANNED INTERVENTIONS: Therapeutic exercises, Therapeutic activity, Neuromuscular re-education, Balance training, Gait training, Patient/Family education, Self Care, Joint mobilization, Joint manipulation, Vestibular training, Canalith repositioning, Prosthetic training, DME instructions, Dry Needling, Electrical stimulation, Wheelchair mobility training, Spinal manipulation, Spinal mobilization, Cryotherapy, Moist heat, Compression bandaging, scar mobilization, Manual therapy, and Re-evaluation  PLAN FOR NEXT SESSION:  Continue to progress LE strengthening and therapeutic activities/prosthetic training.    4:18 PM, 01/28/23 Louis Meckel, PT Physical Therapist - [facility]  [facility] [phone]     Lenda Kelp, PT 01/28/2023, 4:18 PM

## 2023-01-31 ENCOUNTER — Ambulatory Visit: Payer: Medicare Other | Attending: Nurse Practitioner

## 2023-01-31 DIAGNOSIS — M6281 Muscle weakness (generalized): Secondary | ICD-10-CM | POA: Diagnosis present

## 2023-01-31 DIAGNOSIS — R2681 Unsteadiness on feet: Secondary | ICD-10-CM | POA: Diagnosis present

## 2023-01-31 DIAGNOSIS — R269 Unspecified abnormalities of gait and mobility: Secondary | ICD-10-CM | POA: Diagnosis present

## 2023-01-31 DIAGNOSIS — R278 Other lack of coordination: Secondary | ICD-10-CM | POA: Diagnosis present

## 2023-01-31 DIAGNOSIS — R2689 Other abnormalities of gait and mobility: Secondary | ICD-10-CM | POA: Diagnosis present

## 2023-01-31 DIAGNOSIS — R262 Difficulty in walking, not elsewhere classified: Secondary | ICD-10-CM | POA: Diagnosis present

## 2023-02-01 NOTE — Therapy (Signed)
OUTPATIENT PHYSICAL THERAPY TREATMENT    Patient Name: [name]. MRN: [id number] DOB:08-20-1955, 67 y.o., male Today's Date: 02/01/2023  END OF SESSION:  PT End of Session - 01/31/23 0800     Visit Number 14    Number of Visits 24    Date for PT Re-Evaluation 02/20/23    Authorization Type [employer] Medicare    Progress Note Due on Visit 20    PT Start Time 1400    PT Stop Time 1445    PT Time Calculation (min) 45 min    Equipment Utilized During Treatment Gait belt    Activity Tolerance Patient tolerated treatment well;No increased pain;Patient limited by fatigue    Behavior During Therapy [facility] for tasks assessed/performed                    Past Medical History:  Diagnosis Date   Arrhythmia    atrial fibrillation   Asthma    CHF (congestive heart failure) (HCC)    COPD (chronic obstructive pulmonary disease) (HCC)    Coronary artery disease    Depression    Diabetes mellitus without complication (HCC)    Gout    History anabolic steroid use    Hyperlipidemia    Hypertension    Hypogonadism in male    MI (myocardial infarction) (HCC)    Morbid obesity (HCC)    Myocardial infarction (HCC)    Peripheral vascular disease (HCC)    Perirectal abscess    Pleurisy    Sleep apnea    CPAP at night, no oxygen   Varicella    Past Surgical History:  Procedure Laterality Date   ABDOMINAL AORTIC ANEURYSM REPAIR     ACHILLES TENDON SURGERY Left 01/10/2021   Procedure: ACHILLES LENGTHENING/KIDNER;  Surgeon: Rosetta Posner, DPM;  Location: [facility];  Service: Podiatry;  Laterality: Left;   AMPUTATION Left 10/03/2021   Procedure: AMPUTATION BELOW KNEE;  Surgeon: Renford Dills, MD;  Location: [facility];  Service: Vascular;  Laterality: Left;   AMPUTATION Right 05/24/2022   Procedure: AMPUTATION BELOW KNEE;  Surgeon: Annice Needy, MD;  Location: [facility];  Service: General;  Laterality: Right;   AMPUTATION TOE Right 02/10/2016   Procedure: AMPUTATION TOE  3RD TOE;  Surgeon: Gwyneth Revels, DPM;  Location: [facility];  Service: Podiatry;  Laterality: Right;   AMPUTATION TOE Left 02/24/2020   Procedure: AMPUTATION TOE;  Surgeon: Rosetta Posner, DPM;  Location: [facility];  Service: Podiatry;  Laterality: Left;   APPLICATION OF WOUND VAC Left 02/29/2020   Procedure: APPLICATION OF WOUND VAC;  Surgeon: Rosetta Posner, DPM;  Location: [facility];  Service: Podiatry;  Laterality: Left;   CARDIAC CATHETERIZATION     CARDIOVERSION [name] 02/13/2022   Procedure: CARDIOVERSION;  Surgeon: Lamar Blinks, MD;  Location: [facility];  Service: Cardiovascular;  Laterality: [name];   COLONOSCOPY WITH PROPOFOL [name] 11/18/2015   Procedure: COLONOSCOPY WITH PROPOFOL;  Surgeon: Scot Jun, MD;  Location: [facility];  Service: Endoscopy;  Laterality: [name];   CORONARY ARTERY BYPASS GRAFT     CORONARY STENT INTERVENTION [name] 02/02/2020   Procedure: CORONARY STENT INTERVENTION;  Surgeon: Marcina Millard, MD;  Location: [facility];  Service: Cardiovascular;  Laterality: [name];   INCISION AND DRAINAGE Left 08/07/2021   Procedure: INCISION AND DRAINAGE-Partial Calcanectomy;  Surgeon: Rosetta Posner, DPM;  Location: [facility];  Service: Podiatry;  Laterality: Left;   IRRIGATION AND DEBRIDEMENT FOOT Left 02/29/2020   Procedure: IRRIGATION AND DEBRIDEMENT  FOOT;  Surgeon: Rosetta Posner, DPM;  Location: [facility];  Service: Podiatry;  Laterality: Left;   IRRIGATION AND DEBRIDEMENT FOOT Left 02/24/2020   Procedure: IRRIGATION AND DEBRIDEMENT FOOT;  Surgeon: Rosetta Posner, DPM;  Location: [facility];  Service: Podiatry;  Laterality: Left;   KNEE ARTHROSCOPY     LEFT HEART CATH AND CORS/GRAFTS ANGIOGRAPHY [name] 02/02/2020   Procedure: LEFT HEART CATH AND CORS/GRAFTS ANGIOGRAPHY;  Surgeon: Dalia Heading, MD;  Location: [facility];  Service: Cardiovascular;  Laterality: [name];   LOWER EXTREMITY ANGIOGRAPHY Left 02/25/2020   Procedure: Lower Extremity Angiography;   Surgeon: Annice Needy, MD;  Location: [facility];  Service: Cardiovascular;  Laterality: Left;   LOWER EXTREMITY ANGIOGRAPHY Left 01/04/2021   Procedure: LOWER EXTREMITY ANGIOGRAPHY;  Surgeon: Annice Needy, MD;  Location: [facility];  Service: Cardiovascular;  Laterality: Left;   LOWER EXTREMITY ANGIOGRAPHY Right 05/21/2022   Procedure: Lower Extremity Angiography;  Surgeon: Annice Needy, MD;  Location: [facility];  Service: Cardiovascular;  Laterality: Right;   METATARSAL HEAD EXCISION Left 01/10/2021   Procedure: METATARSAL HEAD EXCISION - LEFT 5th;  Surgeon: Rosetta Posner, DPM;  Location: [facility];  Service: Podiatry;  Laterality: Left;   PERIPHERAL VASCULAR CATHETERIZATION Right 01/24/2016   Procedure: Lower Extremity Angiography;  Surgeon: Renford Dills, MD;  Location: [facility];  Service: Cardiovascular;  Laterality: Right;   PERIPHERAL VASCULAR CATHETERIZATION Right 01/25/2016   Procedure: Lower Extremity Angiography;  Surgeon: Renford Dills, MD;  Location: [facility];  Service: Cardiovascular;  Laterality: Right;   TOE AMPUTATION     TONSILLECTOMY     Patient Active Problem List   Diagnosis Date Noted   Status post below-knee amputation of right lower extremity (HCC) 05/31/2022   S/P amputation 05/30/2022   Poorly controlled diabetes mellitus (HCC) 05/30/2022   Acute osteomyelitis of right foot (HCC) 05/19/2022   Wound infection 05/18/2022   Ulcer of right ankle, with fat layer exposed (HCC) 05/18/2022   Non-healing wound of lower extremity, initial encounter 05/18/2022   CAD with Hx of CABG 05/18/2022   Chronic respiratory failure with hypoxia (HCC) 05/18/2022   S/P BKA (below knee amputation), left (HCC) 05/18/2022   COPD (chronic obstructive pulmonary disease) (HCC) 05/18/2022   Perinephric hematoma 03/20/2022   Bacteremia due to Klebsiella pneumoniae 03/19/2022   Hyperglycemia 03/16/2022   Septic shock (HCC)  03/16/2022   Sepsis secondary to UTI (HCC) 03/15/2022   Acute hypoxemic respiratory failure (HCC) 02/13/2022   Atrial fibrillation with rapid ventricular response (HCC) 02/11/2022   Uncontrolled type 2 diabetes mellitus with hyperglycemia, with long-term current use of insulin (HCC) 02/11/2022   Dyslipidemia 02/11/2022   Hypothyroidism 02/11/2022   Benign prostatic hyperplasia with lower urinary tract symptoms 02/11/2022   Pressure injury of skin of heel 11/04/2021   UTI (urinary tract infection) 10/22/2021   Iron deficiency anemia 10/19/2021   Hyperkalemia 10/14/2021   Acute urinary retention 10/08/2021   Sepsis due to methicillin resistant Staphylococcus aureus (MRSA) (HCC) 10/04/2021   Acute delirium 10/04/2021   Acute on chronic respiratory failure with hypoxia and hypercapnia (HCC) 10/04/2021   Acute respiratory failure with hypoxia and hypercapnia (HCC) 10/03/2021   OSA (obstructive sleep apnea) 10/03/2021   Malnutrition of moderate degree 09/29/2021   Generalized weakness 09/29/2021   Cellulitis of left foot 08/04/2021   Uncontrolled type 2 diabetes mellitus with hypoglycemia, with long-term current use of insulin (HCC) 08/04/2021   Bilateral  lower extremity edema 08/04/2021   Acute osteomyelitis of lower leg, left (HCC) 08/04/2021   Swelling of limb 07/18/2021   CHF exacerbation (HCC) 05/08/2021   Diabetes mellitus without complication (HCC)    Atrial fibrillation with RVR (HCC)    Obesity, Class III, BMI 40-49.9 (morbid obesity) (HCC)    Venous stasis ulcer (HCC)    Sinus tachycardia    BPH (benign prostatic hyperplasia)    Chronic anticoagulation    Anasarca    Chronic diastolic CHF (congestive heart failure) (HCC)    Venous stasis of both lower extremities    AF (paroxysmal atrial fibrillation) (HCC)    Lower extremity cellulitis 01/03/2021   Chronic osteomyelitis of left foot (HCC) 01/03/2021   Osteomyelitis (HCC) 01/03/2021   Severe sepsis (HCC) 02/24/2020   AKI  (acute kidney injury) (HCC) 02/24/2020   Bilateral cellulitis of lower leg 02/23/2020   CAD (coronary artery disease) 02/02/2020   RLS (restless legs syndrome) 05/15/2019   Statin myopathy 05/15/2019   Type 2 diabetes mellitus with obesity (HCC) 02/13/2016   Essential hypertension 02/13/2016   PAD (peripheral artery disease) (HCC) 02/13/2016   Pressure injury of skin 01/25/2016   Hypoglycemia associated with diabetes (HCC) 12/27/2013   Long-term insulin use (HCC) 12/27/2013   Microalbuminuria 12/27/2013   B12 deficiency 09/29/2013   Obesity 09/29/2013   CAD (coronary artery disease), autologous vein bypass graft 07/20/2013   Hypersomnia with sleep apnea 07/20/2013   Pure hypercholesterolemia 07/20/2013   Osteomyelitis of ankle or foot 07/20/2011    PCP: Dr. Aram Beecham  REFERRING PROVIDER: Sheppard Plumber, NP  REFERRING DIAG: [phone] (ICD-10-CM) - Status post below-knee amputation of right lower extremity (HCC)   THERAPY DIAG:  Difficulty in walking, not elsewhere classified  Muscle weakness (generalized)  Other abnormalities of gait and mobility  Other lack of coordination  Abnormality of gait and mobility  Rationale for Evaluation and Treatment: Rehabilitation  ONSET DATE: 05/24/2022 (date of surgery for latest Below knee amputation- RLE)   SUBJECTIVE:   SUBJECTIVE STATEMENT: Patient reports exhibiting more low back pain over the weekend and today. States otherwise doing okay- Reports slight more difficulty donning prostheses today.      PERTINENT HISTORY: Patient is a 67 year old male with CAD/PAF on Eliquis followed by Cardiology.. Obese with BMI>38. HTN stable on meds. Has HLD not on statin due to myopathy. Also with DM on insulin and thyroid dz on Synthroid. (See above section for all PMH) Patient reports he is eager to be here and a long road to recovery- Reports having his left leg amputated last year (10/03/2021) and most recently his right LE - both below the  knee amputations. Reports very dependent with all mobility and states ready to get stronger and on his feet. States just received his prosthesis yesterday.   Patient using [facility] for prosthetic care PAIN:  Are you having pain? No  PRECAUTIONS: Fall  RED FLAGS: None   WEIGHT BEARING RESTRICTIONS: No  FALLS:  Has patient fallen in last 6 months?  Will ask next visit  LIVING ENVIRONMENT: Lives with: lives with their spouse Lives in: House/apartment Stairs:  split level home - 2 step down to den- ramp for entry in home.  Has following equipment at home: Quad cane large base, Environmental consultant - 2 wheeled, Wheelchair (manual), [employer], Ramped entry, and [employer], hoyer lift  OCCUPATION: Psychologist, occupational- retired  PLOF: Independent- prior to 10/03/2021 (left BKA)   PATIENT GOALS: "I want to walk with my prostheses"  NEXT MD VISIT: TBA  OBJECTIVE:   TODAY'S TREATMENT:                                                                                                                              DATE: 01/31/2023         Therapeutic activities: - assisted patient in donning each Prosthesis (increased difficulty donning left prosthesis)  Hoyer lift from manual w/c to supine on mat.  Patient then able to roll right then left to help PT in applying lite- gait harness.   Patient then performed supine to sit at mat with max assist (with B prostheses) to set up at [facility] to perform transfers  Using Lite Gait unweighing system- patient performed several sit to stand focusing on erect posture and positioning each LE underneath him.   Dynamic march in place using Lite Gait- with BUE Support - 2 sets of 10 reps.    PATIENT EDUCATION:  Exercise technique for HEP   HOME EXERCISE PROGRAM: Access Code: [email or web address] URL: [email or web address] Date: 12/20/2022 Prepared by: Maureen Ralphs  Exercises - Supine Quad Set (BKA)  - 1 x daily - 3 x weekly - 3 sets - 10 reps - Sidelying Hip  Abduction wtih Flexion and Extension (BKA)  - 1 x daily - 3 x weekly - 3 sets - 10 reps - Supine Single Leg Bridge with Sound Leg (BKA)  - 1 x daily - 3 x weekly - 3 sets - 10 reps - Supine Hip Extension with Towel Roll (BKA)  - 1 x daily - 3 x weekly - 3 sets - 10 reps - Hooklying Clamshell with Resistance  - 1 x daily - 3 x weekly - 3 sets - 10 reps    -determination of a schedule at home for wearing of prosthese and shrinkers; recommended on 3x daily for prostheses with ad lib increase in time. -discussed pros and cons of shrink use/disuse overnight   ASSESSMENT:  CLINICAL IMPRESSION: Treatment was successful today in terms of using Lite Gait to have patient perofrm  some dynamic LE movement- he was able to briefly shift weight and take a small march each LE and remain in standing using unweighing system. He will need to continue to try to see if able to use system to potentially walki. He will benefit from skilled PT services to improve his overall LE strength, transfer ability, and possibly able to return to walking with prosthetic training for improved mobility and quality of life.    OBJECTIVE IMPAIRMENTS: decreased activity tolerance, decreased balance, decreased endurance, difficulty walking, decreased strength, and obesity.   ACTIVITY LIMITATIONS: carrying, lifting, bending, standing, squatting, stairs, transfers, bed mobility, bathing, toileting, and dressing  PARTICIPATION LIMITATIONS: cleaning, driving, shopping, community activity, occupation, and yard work  PERSONAL FACTORS: Time since onset of injury/illness/exacerbation and 3+ comorbidities: DM, COPD, PAD  are also affecting patient's functional outcome.   REHAB POTENTIAL: Good  CLINICAL DECISION MAKING: Evolving/moderate complexity  EVALUATION COMPLEXITY: Moderate   GOALS: Goals reviewed with patient? Yes  SHORT TERM GOALS: Target date: 01/09/2023  LONG TERM GOALS: Target date: 02/20/2023   1.  Patient  will demonstrate modified independent with all bed mobility (sup<-> sit)  for optimal function at home  Baseline: EVAL - Will assess 2nd visit; 01/08/2023- patient able to roll with assist of wife Goal status: ONGOING  2.  Patient will increase FOTO score to equal to or greater than  52 to demonstrate statistically significant improvement in mobility and quality of life.  Baseline: EVAL = 41 Goal status: INITIAL   3.  Patient will demonstrate all slide board transfers with Min assist (Bed <-> chair)   Baseline: EVAL= Patient unable to transfer from chair to bed due to increased musle weakness; 01/08/2023- Patient continues to require hoyer lift for all transfers Goal status: INITIAL       PLAN:  PT FREQUENCY: 1-2x/week  PT DURATION: 12 weeks  PLANNED INTERVENTIONS: Therapeutic exercises, Therapeutic activity, Neuromuscular re-education, Balance training, Gait training, Patient/Family education, Self Care, Joint mobilization, Joint manipulation, Vestibular training, Canalith repositioning, Prosthetic training, DME instructions, Dry Needling, Electrical stimulation, Wheelchair mobility training, Spinal manipulation, Spinal mobilization, Cryotherapy, Moist heat, Compression bandaging, scar mobilization, Manual therapy, and Re-evaluation  PLAN FOR NEXT SESSION:  Continue to progress LE strengthening and therapeutic activities/prosthetic training.  Trial again of Lite Gait.   11:33 AM, 02/01/23 Louis Meckel, PT Physical Therapist - [facility]  [facility] [phone]     Lenda Kelp, PT 02/01/2023, 11:33 AM

## 2023-02-05 ENCOUNTER — Ambulatory Visit: Payer: Medicare Other

## 2023-02-05 DIAGNOSIS — R2681 Unsteadiness on feet: Secondary | ICD-10-CM

## 2023-02-05 DIAGNOSIS — R2689 Other abnormalities of gait and mobility: Secondary | ICD-10-CM

## 2023-02-05 DIAGNOSIS — R262 Difficulty in walking, not elsewhere classified: Secondary | ICD-10-CM | POA: Diagnosis not present

## 2023-02-05 DIAGNOSIS — R278 Other lack of coordination: Secondary | ICD-10-CM

## 2023-02-05 DIAGNOSIS — M6281 Muscle weakness (generalized): Secondary | ICD-10-CM

## 2023-02-05 DIAGNOSIS — R269 Unspecified abnormalities of gait and mobility: Secondary | ICD-10-CM

## 2023-02-06 NOTE — Therapy (Signed)
OUTPATIENT PHYSICAL THERAPY TREATMENT    Patient Name: [name]. MRN: [id number] DOB:11/27/1955, 67 y.o., male Today's Date: 02/06/2023  END OF SESSION:  PT End of Session - 02/06/23 0808     Visit Number 15    Number of Visits 24    Date for PT Re-Evaluation 02/20/23    Authorization Type [employer] Medicare    Progress Note Due on Visit 20    PT Start Time 1315    PT Stop Time 1359    PT Time Calculation (min) 44 min    Equipment Utilized During Treatment Gait belt    Activity Tolerance Patient tolerated treatment well;No increased pain;Patient limited by fatigue    Behavior During Therapy [facility] for tasks assessed/performed                    Past Medical History:  Diagnosis Date   Arrhythmia    atrial fibrillation   Asthma    CHF (congestive heart failure) (HCC)    COPD (chronic obstructive pulmonary disease) (HCC)    Coronary artery disease    Depression    Diabetes mellitus without complication (HCC)    Gout    History anabolic steroid use    Hyperlipidemia    Hypertension    Hypogonadism in male    MI (myocardial infarction) (HCC)    Morbid obesity (HCC)    Myocardial infarction (HCC)    Peripheral vascular disease (HCC)    Perirectal abscess    Pleurisy    Sleep apnea    CPAP at night, no oxygen   Varicella    Past Surgical History:  Procedure Laterality Date   ABDOMINAL AORTIC ANEURYSM REPAIR     ACHILLES TENDON SURGERY Left 01/10/2021   Procedure: ACHILLES LENGTHENING/KIDNER;  Surgeon: Rosetta Posner, DPM;  Location: [facility];  Service: Podiatry;  Laterality: Left;   AMPUTATION Left 10/03/2021   Procedure: AMPUTATION BELOW KNEE;  Surgeon: Renford Dills, MD;  Location: [facility];  Service: Vascular;  Laterality: Left;   AMPUTATION Right 05/24/2022   Procedure: AMPUTATION BELOW KNEE;  Surgeon: Annice Needy, MD;  Location: [facility];  Service: General;  Laterality: Right;   AMPUTATION TOE Right 02/10/2016   Procedure: AMPUTATION TOE  3RD TOE;  Surgeon: Gwyneth Revels, DPM;  Location: [facility];  Service: Podiatry;  Laterality: Right;   AMPUTATION TOE Left 02/24/2020   Procedure: AMPUTATION TOE;  Surgeon: Rosetta Posner, DPM;  Location: [facility];  Service: Podiatry;  Laterality: Left;   APPLICATION OF WOUND VAC Left 02/29/2020   Procedure: APPLICATION OF WOUND VAC;  Surgeon: Rosetta Posner, DPM;  Location: [facility];  Service: Podiatry;  Laterality: Left;   CARDIAC CATHETERIZATION     CARDIOVERSION [name] 02/13/2022   Procedure: CARDIOVERSION;  Surgeon: Lamar Blinks, MD;  Location: [facility];  Service: Cardiovascular;  Laterality: [name];   COLONOSCOPY WITH PROPOFOL [name] 11/18/2015   Procedure: COLONOSCOPY WITH PROPOFOL;  Surgeon: Scot Jun, MD;  Location: [facility];  Service: Endoscopy;  Laterality: [name];   CORONARY ARTERY BYPASS GRAFT     CORONARY STENT INTERVENTION [name] 02/02/2020   Procedure: CORONARY STENT INTERVENTION;  Surgeon: Marcina Millard, MD;  Location: [facility];  Service: Cardiovascular;  Laterality: [name];   INCISION AND DRAINAGE Left 08/07/2021   Procedure: INCISION AND DRAINAGE-Partial Calcanectomy;  Surgeon: Rosetta Posner, DPM;  Location: [facility];  Service: Podiatry;  Laterality: Left;   IRRIGATION AND DEBRIDEMENT FOOT Left 02/29/2020   Procedure: IRRIGATION AND DEBRIDEMENT  FOOT;  Surgeon: Rosetta Posner, DPM;  Location: [facility];  Service: Podiatry;  Laterality: Left;   IRRIGATION AND DEBRIDEMENT FOOT Left 02/24/2020   Procedure: IRRIGATION AND DEBRIDEMENT FOOT;  Surgeon: Rosetta Posner, DPM;  Location: [facility];  Service: Podiatry;  Laterality: Left;   KNEE ARTHROSCOPY     LEFT HEART CATH AND CORS/GRAFTS ANGIOGRAPHY [name] 02/02/2020   Procedure: LEFT HEART CATH AND CORS/GRAFTS ANGIOGRAPHY;  Surgeon: Dalia Heading, MD;  Location: [facility];  Service: Cardiovascular;  Laterality: [name];   LOWER EXTREMITY ANGIOGRAPHY Left 02/25/2020   Procedure: Lower Extremity Angiography;   Surgeon: Annice Needy, MD;  Location: [facility];  Service: Cardiovascular;  Laterality: Left;   LOWER EXTREMITY ANGIOGRAPHY Left 01/04/2021   Procedure: LOWER EXTREMITY ANGIOGRAPHY;  Surgeon: Annice Needy, MD;  Location: [facility];  Service: Cardiovascular;  Laterality: Left;   LOWER EXTREMITY ANGIOGRAPHY Right 05/21/2022   Procedure: Lower Extremity Angiography;  Surgeon: Annice Needy, MD;  Location: [facility];  Service: Cardiovascular;  Laterality: Right;   METATARSAL HEAD EXCISION Left 01/10/2021   Procedure: METATARSAL HEAD EXCISION - LEFT 5th;  Surgeon: Rosetta Posner, DPM;  Location: [facility];  Service: Podiatry;  Laterality: Left;   PERIPHERAL VASCULAR CATHETERIZATION Right 01/24/2016   Procedure: Lower Extremity Angiography;  Surgeon: Renford Dills, MD;  Location: [facility];  Service: Cardiovascular;  Laterality: Right;   PERIPHERAL VASCULAR CATHETERIZATION Right 01/25/2016   Procedure: Lower Extremity Angiography;  Surgeon: Renford Dills, MD;  Location: [facility];  Service: Cardiovascular;  Laterality: Right;   TOE AMPUTATION     TONSILLECTOMY     Patient Active Problem List   Diagnosis Date Noted   Status post below-knee amputation of right lower extremity (HCC) 05/31/2022   S/P amputation 05/30/2022   Poorly controlled diabetes mellitus (HCC) 05/30/2022   Acute osteomyelitis of right foot (HCC) 05/19/2022   Wound infection 05/18/2022   Ulcer of right ankle, with fat layer exposed (HCC) 05/18/2022   Non-healing wound of lower extremity, initial encounter 05/18/2022   CAD with Hx of CABG 05/18/2022   Chronic respiratory failure with hypoxia (HCC) 05/18/2022   S/P BKA (below knee amputation), left (HCC) 05/18/2022   COPD (chronic obstructive pulmonary disease) (HCC) 05/18/2022   Perinephric hematoma 03/20/2022   Bacteremia due to Klebsiella pneumoniae 03/19/2022   Hyperglycemia 03/16/2022   Septic shock (HCC)  03/16/2022   Sepsis secondary to UTI (HCC) 03/15/2022   Acute hypoxemic respiratory failure (HCC) 02/13/2022   Atrial fibrillation with rapid ventricular response (HCC) 02/11/2022   Uncontrolled type 2 diabetes mellitus with hyperglycemia, with long-term current use of insulin (HCC) 02/11/2022   Dyslipidemia 02/11/2022   Hypothyroidism 02/11/2022   Benign prostatic hyperplasia with lower urinary tract symptoms 02/11/2022   Pressure injury of skin of heel 11/04/2021   UTI (urinary tract infection) 10/22/2021   Iron deficiency anemia 10/19/2021   Hyperkalemia 10/14/2021   Acute urinary retention 10/08/2021   Sepsis due to methicillin resistant Staphylococcus aureus (MRSA) (HCC) 10/04/2021   Acute delirium 10/04/2021   Acute on chronic respiratory failure with hypoxia and hypercapnia (HCC) 10/04/2021   Acute respiratory failure with hypoxia and hypercapnia (HCC) 10/03/2021   OSA (obstructive sleep apnea) 10/03/2021   Malnutrition of moderate degree 09/29/2021   Generalized weakness 09/29/2021   Cellulitis of left foot 08/04/2021   Uncontrolled type 2 diabetes mellitus with hypoglycemia, with long-term current use of insulin (HCC) 08/04/2021   Bilateral  lower extremity edema 08/04/2021   Acute osteomyelitis of lower leg, left (HCC) 08/04/2021   Swelling of limb 07/18/2021   CHF exacerbation (HCC) 05/08/2021   Diabetes mellitus without complication (HCC)    Atrial fibrillation with RVR (HCC)    Obesity, Class III, BMI 40-49.9 (morbid obesity) (HCC)    Venous stasis ulcer (HCC)    Sinus tachycardia    BPH (benign prostatic hyperplasia)    Chronic anticoagulation    Anasarca    Chronic diastolic CHF (congestive heart failure) (HCC)    Venous stasis of both lower extremities    AF (paroxysmal atrial fibrillation) (HCC)    Lower extremity cellulitis 01/03/2021   Chronic osteomyelitis of left foot (HCC) 01/03/2021   Osteomyelitis (HCC) 01/03/2021   Severe sepsis (HCC) 02/24/2020   AKI  (acute kidney injury) (HCC) 02/24/2020   Bilateral cellulitis of lower leg 02/23/2020   CAD (coronary artery disease) 02/02/2020   RLS (restless legs syndrome) 05/15/2019   Statin myopathy 05/15/2019   Type 2 diabetes mellitus with obesity (HCC) 02/13/2016   Essential hypertension 02/13/2016   PAD (peripheral artery disease) (HCC) 02/13/2016   Pressure injury of skin 01/25/2016   Hypoglycemia associated with diabetes (HCC) 12/27/2013   Long-term insulin use (HCC) 12/27/2013   Microalbuminuria 12/27/2013   B12 deficiency 09/29/2013   Obesity 09/29/2013   CAD (coronary artery disease), autologous vein bypass graft 07/20/2013   Hypersomnia with sleep apnea 07/20/2013   Pure hypercholesterolemia 07/20/2013   Osteomyelitis of ankle or foot 07/20/2011    PCP: Dr. Aram Beecham  REFERRING PROVIDER: Sheppard Plumber, NP  REFERRING DIAG: [phone] (ICD-10-CM) - Status post below-knee amputation of right lower extremity (HCC)   THERAPY DIAG:  Difficulty in walking, not elsewhere classified  Muscle weakness (generalized)  Other abnormalities of gait and mobility  Other lack of coordination  Abnormality of gait and mobility  Unsteadiness on feet  Rationale for Evaluation and Treatment: Rehabilitation  ONSET DATE: 05/24/2022 (date of surgery for latest Below knee amputation- RLE)   SUBJECTIVE:   SUBJECTIVE STATEMENT: Patient reports was able to don his prostheses with 1-2 clicks. Reports continued compliance with HEP.      PERTINENT HISTORY: Patient is a 67 year old male with CAD/PAF on Eliquis followed by Cardiology.. Obese with BMI>38. HTN stable on meds. Has HLD not on statin due to myopathy. Also with DM on insulin and thyroid dz on Synthroid. (See above section for all PMH) Patient reports he is eager to be here and a long road to recovery- Reports having his left leg amputated last year (10/03/2021) and most recently his right LE - both below the knee amputations. Reports very  dependent with all mobility and states ready to get stronger and on his feet. States just received his prosthesis yesterday.   Patient using [facility] for prosthetic care PAIN:  Are you having pain? No  PRECAUTIONS: Fall  RED FLAGS: None   WEIGHT BEARING RESTRICTIONS: No  FALLS:  Has patient fallen in last 6 months?  Will ask next visit  LIVING ENVIRONMENT: Lives with: lives with their spouse Lives in: House/apartment Stairs:  split level home - 2 step down to den- ramp for entry in home.  Has following equipment at home: Quad cane large base, Environmental consultant - 2 wheeled, Wheelchair (manual), [employer], Ramped entry, and [employer], hoyer lift  OCCUPATION: Psychologist, occupational- retired  PLOF: Independent- prior to 10/03/2021 (left BKA)   PATIENT GOALS: "I want to walk with my prostheses"  NEXT MD VISIT:  TBA  OBJECTIVE:   TODAY'S TREATMENT:                                                                                                                              DATE: 01/31/2023         Therapeutic activities: - assisted patient in donning each Prosthesis     -Hoyer lift from manual w/c to sitting edge of mat.  - Assisted patient in modifying manual w/c - seat dump to normal flat height- increased time required to adjust.  - Sit to partial stand at edge of mat- several attempts and patient able to clear his bottom and 1/2 to 3/4 stand today. Increased physical assist- max A and patient utilized hands on walker.   - Hoyer from edge of mat back to adjusted manual w/c- patient reported feeling more comfortable to new adjustments.    HOME EXERCISE PROGRAM: Access Code: [email or web address] URL: [email or web address] Date: 12/20/2022 Prepared by: Maureen Ralphs  Exercises - Supine Quad Set (BKA)  - 1 x daily - 3 x weekly - 3 sets - 10 reps - Sidelying Hip Abduction wtih Flexion and Extension (BKA)  - 1 x daily - 3 x weekly - 3 sets - 10 reps - Supine Single Leg Bridge with Sound  Leg (BKA)  - 1 x daily - 3 x weekly - 3 sets - 10 reps - Supine Hip Extension with Towel Roll (BKA)  - 1 x daily - 3 x weekly - 3 sets - 10 reps - Hooklying Clamshell with Resistance  - 1 x daily - 3 x weekly - 3 sets - 10 reps    -determination of a schedule at home for wearing of prosthese and shrinkers; recommended on 3x daily for prostheses with ad lib increase in time. -discussed pros and cons of shrink use/disuse overnight   ASSESSMENT:  CLINICAL IMPRESSION: Patient presented today with excellent motivation. His w/c was adjusted due to ill fitting in increased dump position- repositioned today back to horizontal flat with improved report of comfort. Patient demonstrated strong B Upper extremities and able to push up into 1/2 to 3/4 standing without any unweighing system- max A from PT today. He will benefit from skilled PT services to improve his overall LE strength, transfer ability, and possibly able to return to walking with prosthetic training for improved mobility and quality of life.    OBJECTIVE IMPAIRMENTS: decreased activity tolerance, decreased balance, decreased endurance, difficulty walking, decreased strength, and obesity.   ACTIVITY LIMITATIONS: carrying, lifting, bending, standing, squatting, stairs, transfers, bed mobility, bathing, toileting, and dressing  PARTICIPATION LIMITATIONS: cleaning, driving, shopping, community activity, occupation, and yard work  PERSONAL FACTORS: Time since onset of injury/illness/exacerbation and 3+ comorbidities: DM, COPD, PAD  are also affecting patient's functional outcome.   REHAB POTENTIAL: Good  CLINICAL DECISION MAKING: Evolving/moderate complexity  EVALUATION COMPLEXITY: Moderate   GOALS: Goals reviewed with patient? Yes  SHORT TERM GOALS: Target date: 01/09/2023  LONG TERM GOALS: Target date: 02/20/2023   1.  Patient will demonstrate modified independent with all bed mobility (sup<-> sit)  for optimal function at  home  Baseline: EVAL - Will assess 2nd visit; 01/08/2023- patient able to roll with assist of wife Goal status: ONGOING  2.  Patient will increase FOTO score to equal to or greater than  52 to demonstrate statistically significant improvement in mobility and quality of life.  Baseline: EVAL = 41 Goal status: INITIAL   3.  Patient will demonstrate all slide board transfers with Min assist (Bed <-> chair)   Baseline: EVAL= Patient unable to transfer from chair to bed due to increased musle weakness; 01/08/2023- Patient continues to require hoyer lift for all transfers Goal status: INITIAL       PLAN:  PT FREQUENCY: 1-2x/week  PT DURATION: 12 weeks  PLANNED INTERVENTIONS: Therapeutic exercises, Therapeutic activity, Neuromuscular re-education, Balance training, Gait training, Patient/Family education, Self Care, Joint mobilization, Joint manipulation, Vestibular training, Canalith repositioning, Prosthetic training, DME instructions, Dry Needling, Electrical stimulation, Wheelchair mobility training, Spinal manipulation, Spinal mobilization, Cryotherapy, Moist heat, Compression bandaging, scar mobilization, Manual therapy, and Re-evaluation  PLAN FOR NEXT SESSION:  Continue to progress LE strengthening and therapeutic activities/prosthetic training.  Trial again of Lite Gait.   8:11 AM, 02/06/23 Louis Meckel, PT Physical Therapist - [facility]  Outpatient Physical Therapy- Main Campus [phone]     Lenda Kelp, PT 02/06/2023, 8:11 AM

## 2023-02-07 ENCOUNTER — Ambulatory Visit: Payer: Medicare Other

## 2023-02-07 DIAGNOSIS — R262 Difficulty in walking, not elsewhere classified: Secondary | ICD-10-CM | POA: Diagnosis not present

## 2023-02-07 DIAGNOSIS — R278 Other lack of coordination: Secondary | ICD-10-CM

## 2023-02-07 DIAGNOSIS — M6281 Muscle weakness (generalized): Secondary | ICD-10-CM

## 2023-02-07 DIAGNOSIS — R269 Unspecified abnormalities of gait and mobility: Secondary | ICD-10-CM

## 2023-02-07 DIAGNOSIS — R2689 Other abnormalities of gait and mobility: Secondary | ICD-10-CM

## 2023-02-07 NOTE — Therapy (Signed)
OUTPATIENT PHYSICAL THERAPY TREATMENT    Patient Name: [name]. MRN: [id number] DOB:1956/01/13, 67 y.o., male Today's Date: 02/07/2023  END OF SESSION:  PT End of Session - 02/07/23 1711     Visit Number 16    Number of Visits 24    Date for PT Re-Evaluation 02/20/23    Authorization Type [employer]BS Medicare    Progress Note Due on Visit 20    PT Start Time 1530    PT Stop Time 1614    PT Time Calculation (min) 44 min    Equipment Utilized During Treatment Gait belt    Activity Tolerance Patient tolerated treatment well;No increased pain;Patient limited by fatigue    Behavior During Therapy [facility] for tasks assessed/performed                    Past Medical History:  Diagnosis Date   Arrhythmia    atrial fibrillation   Asthma    CHF (congestive heart failure) (HCC)    COPD (chronic obstructive pulmonary disease) (HCC)    Coronary artery disease    Depression    Diabetes mellitus without complication (HCC)    Gout    History anabolic steroid use    Hyperlipidemia    Hypertension    Hypogonadism in male    MI (myocardial infarction) (HCC)    Morbid obesity (HCC)    Myocardial infarction (HCC)    Peripheral vascular disease (HCC)    Perirectal abscess    Pleurisy    Sleep apnea    CPAP at night, no oxygen   Varicella    Past Surgical History:  Procedure Laterality Date   ABDOMINAL AORTIC ANEURYSM REPAIR     ACHILLES TENDON SURGERY Left 01/10/2021   Procedure: ACHILLES LENGTHENING/KIDNER;  Surgeon: Rosetta Posner, DPM;  Location: [facility];  Service: Podiatry;  Laterality: Left;   AMPUTATION Left 10/03/2021   Procedure: AMPUTATION BELOW KNEE;  Surgeon: Renford Dills, MD;  Location: [facility];  Service: Vascular;  Laterality: Left;   AMPUTATION Right 05/24/2022   Procedure: AMPUTATION BELOW KNEE;  Surgeon: Annice Needy, MD;  Location: [facility];  Service: General;  Laterality: Right;   AMPUTATION TOE Right 02/10/2016   Procedure: AMPUTATION TOE  3RD TOE;  Surgeon: Gwyneth Revels, DPM;  Location: [facility];  Service: Podiatry;  Laterality: Right;   AMPUTATION TOE Left 02/24/2020   Procedure: AMPUTATION TOE;  Surgeon: Rosetta Posner, DPM;  Location: [facility];  Service: Podiatry;  Laterality: Left;   APPLICATION OF WOUND VAC Left 02/29/2020   Procedure: APPLICATION OF WOUND VAC;  Surgeon: Rosetta Posner, DPM;  Location: [facility];  Service: Podiatry;  Laterality: Left;   CARDIAC CATHETERIZATION     CARDIOVERSION [name] 02/13/2022   Procedure: CARDIOVERSION;  Surgeon: Lamar Blinks, MD;  Location: [facility];  Service: Cardiovascular;  Laterality: [name];   COLONOSCOPY WITH PROPOFOL [name] 11/18/2015   Procedure: COLONOSCOPY WITH PROPOFOL;  Surgeon: Scot Jun, MD;  Location: [facility];  Service: Endoscopy;  Laterality: [name];   CORONARY ARTERY BYPASS GRAFT     CORONARY STENT INTERVENTION [name] 02/02/2020   Procedure: CORONARY STENT INTERVENTION;  Surgeon: Marcina Millard, MD;  Location: [facility];  Service: Cardiovascular;  Laterality: [name];   INCISION AND DRAINAGE Left 08/07/2021   Procedure: INCISION AND DRAINAGE-Partial Calcanectomy;  Surgeon: Rosetta Posner, DPM;  Location: [facility];  Service: Podiatry;  Laterality: Left;   IRRIGATION AND DEBRIDEMENT FOOT Left 02/29/2020   Procedure: IRRIGATION AND DEBRIDEMENT  FOOT;  Surgeon: Rosetta Posner, DPM;  Location: [facility];  Service: Podiatry;  Laterality: Left;   IRRIGATION AND DEBRIDEMENT FOOT Left 02/24/2020   Procedure: IRRIGATION AND DEBRIDEMENT FOOT;  Surgeon: Rosetta Posner, DPM;  Location: [facility];  Service: Podiatry;  Laterality: Left;   KNEE ARTHROSCOPY     LEFT HEART CATH AND CORS/GRAFTS ANGIOGRAPHY [name] 02/02/2020   Procedure: LEFT HEART CATH AND CORS/GRAFTS ANGIOGRAPHY;  Surgeon: Dalia Heading, MD;  Location: [facility];  Service: Cardiovascular;  Laterality: [name];   LOWER EXTREMITY ANGIOGRAPHY Left 02/25/2020   Procedure: Lower Extremity Angiography;   Surgeon: Annice Needy, MD;  Location: [facility];  Service: Cardiovascular;  Laterality: Left;   LOWER EXTREMITY ANGIOGRAPHY Left 01/04/2021   Procedure: LOWER EXTREMITY ANGIOGRAPHY;  Surgeon: Annice Needy, MD;  Location: [facility];  Service: Cardiovascular;  Laterality: Left;   LOWER EXTREMITY ANGIOGRAPHY Right 05/21/2022   Procedure: Lower Extremity Angiography;  Surgeon: Annice Needy, MD;  Location: [facility];  Service: Cardiovascular;  Laterality: Right;   METATARSAL HEAD EXCISION Left 01/10/2021   Procedure: METATARSAL HEAD EXCISION - LEFT 5th;  Surgeon: Rosetta Posner, DPM;  Location: [facility];  Service: Podiatry;  Laterality: Left;   PERIPHERAL VASCULAR CATHETERIZATION Right 01/24/2016   Procedure: Lower Extremity Angiography;  Surgeon: Renford Dills, MD;  Location: [facility];  Service: Cardiovascular;  Laterality: Right;   PERIPHERAL VASCULAR CATHETERIZATION Right 01/25/2016   Procedure: Lower Extremity Angiography;  Surgeon: Renford Dills, MD;  Location: [facility];  Service: Cardiovascular;  Laterality: Right;   TOE AMPUTATION     TONSILLECTOMY     Patient Active Problem List   Diagnosis Date Noted   Status post below-knee amputation of right lower extremity (HCC) 05/31/2022   S/P amputation 05/30/2022   Poorly controlled diabetes mellitus (HCC) 05/30/2022   Acute osteomyelitis of right foot (HCC) 05/19/2022   Wound infection 05/18/2022   Ulcer of right ankle, with fat layer exposed (HCC) 05/18/2022   Non-healing wound of lower extremity, initial encounter 05/18/2022   CAD with Hx of CABG 05/18/2022   Chronic respiratory failure with hypoxia (HCC) 05/18/2022   S/P BKA (below knee amputation), left (HCC) 05/18/2022   COPD (chronic obstructive pulmonary disease) (HCC) 05/18/2022   Perinephric hematoma 03/20/2022   Bacteremia due to Klebsiella pneumoniae 03/19/2022   Hyperglycemia 03/16/2022   Septic shock (HCC)  03/16/2022   Sepsis secondary to UTI (HCC) 03/15/2022   Acute hypoxemic respiratory failure (HCC) 02/13/2022   Atrial fibrillation with rapid ventricular response (HCC) 02/11/2022   Uncontrolled type 2 diabetes mellitus with hyperglycemia, with long-term current use of insulin (HCC) 02/11/2022   Dyslipidemia 02/11/2022   Hypothyroidism 02/11/2022   Benign prostatic hyperplasia with lower urinary tract symptoms 02/11/2022   Pressure injury of skin of heel 11/04/2021   UTI (urinary tract infection) 10/22/2021   Iron deficiency anemia 10/19/2021   Hyperkalemia 10/14/2021   Acute urinary retention 10/08/2021   Sepsis due to methicillin resistant Staphylococcus aureus (MRSA) (HCC) 10/04/2021   Acute delirium 10/04/2021   Acute on chronic respiratory failure with hypoxia and hypercapnia (HCC) 10/04/2021   Acute respiratory failure with hypoxia and hypercapnia (HCC) 10/03/2021   OSA (obstructive sleep apnea) 10/03/2021   Malnutrition of moderate degree 09/29/2021   Generalized weakness 09/29/2021   Cellulitis of left foot 08/04/2021   Uncontrolled type 2 diabetes mellitus with hypoglycemia, with long-term current use of insulin (HCC) 08/04/2021   Bilateral  lower extremity edema 08/04/2021   Acute osteomyelitis of lower leg, left (HCC) 08/04/2021   Swelling of limb 07/18/2021   CHF exacerbation (HCC) 05/08/2021   Diabetes mellitus without complication (HCC)    Atrial fibrillation with RVR (HCC)    Obesity, Class III, BMI 40-49.9 (morbid obesity) (HCC)    Venous stasis ulcer (HCC)    Sinus tachycardia    BPH (benign prostatic hyperplasia)    Chronic anticoagulation    Anasarca    Chronic diastolic CHF (congestive heart failure) (HCC)    Venous stasis of both lower extremities    AF (paroxysmal atrial fibrillation) (HCC)    Lower extremity cellulitis 01/03/2021   Chronic osteomyelitis of left foot (HCC) 01/03/2021   Osteomyelitis (HCC) 01/03/2021   Severe sepsis (HCC) 02/24/2020   AKI  (acute kidney injury) (HCC) 02/24/2020   Bilateral cellulitis of lower leg 02/23/2020   CAD (coronary artery disease) 02/02/2020   RLS (restless legs syndrome) 05/15/2019   Statin myopathy 05/15/2019   Type 2 diabetes mellitus with obesity (HCC) 02/13/2016   Essential hypertension 02/13/2016   PAD (peripheral artery disease) (HCC) 02/13/2016   Pressure injury of skin 01/25/2016   Hypoglycemia associated with diabetes (HCC) 12/27/2013   Long-term insulin use (HCC) 12/27/2013   Microalbuminuria 12/27/2013   B12 deficiency 09/29/2013   Obesity 09/29/2013   CAD (coronary artery disease), autologous vein bypass graft 07/20/2013   Hypersomnia with sleep apnea 07/20/2013   Pure hypercholesterolemia 07/20/2013   Osteomyelitis of ankle or foot 07/20/2011    PCP: Dr. Aram Beecham  REFERRING PROVIDER: Sheppard Plumber, NP  REFERRING DIAG: [phone] (ICD-10-CM) - Status post below-knee amputation of right lower extremity (HCC)   THERAPY DIAG:  Difficulty in walking, not elsewhere classified  Muscle weakness (generalized)  Other abnormalities of gait and mobility  Other lack of coordination  Abnormality of gait and mobility  Rationale for Evaluation and Treatment: Rehabilitation  ONSET DATE: 05/24/2022 (date of surgery for latest Below knee amputation- RLE)   SUBJECTIVE:   SUBJECTIVE STATEMENT: Patient reports was able to don his prostheses with 1-2 clicks. Reports continued compliance with HEP.      PERTINENT HISTORY: Patient is a 67 year old male with CAD/PAF on Eliquis followed by Cardiology.. Obese with BMI>38. HTN stable on meds. Has HLD not on statin due to myopathy. Also with DM on insulin and thyroid dz on Synthroid. (See above section for all PMH) Patient reports he is eager to be here and a long road to recovery- Reports having his left leg amputated last year (10/03/2021) and most recently his right LE - both below the knee amputations. Reports very dependent with all mobility  and states ready to get stronger and on his feet. States just received his prosthesis yesterday.   Patient using [facility] for prosthetic care PAIN:  Are you having pain? No  PRECAUTIONS: Fall  RED FLAGS: None   WEIGHT BEARING RESTRICTIONS: No  FALLS:  Has patient fallen in last 6 months?  Will ask next visit  LIVING ENVIRONMENT: Lives with: lives with their spouse Lives in: House/apartment Stairs:  split level home - 2 step down to den- ramp for entry in home.  Has following equipment at home: Chiropractor, Environmental consultant - 2 wheeled, Wheelchair (manual), [employer], Ramped entry, and [employer], hoyer lift  OCCUPATION: Psychologist, occupational- retired  PLOF: Independent- prior to 10/03/2021 (left BKA)   PATIENT GOALS: "I want to walk with my prostheses"  NEXT MD VISIT: TBA  OBJECTIVE:  TODAY'S TREATMENT:                                                                                                                              DATE: 01/31/2023         Therapeutic activities: - assisted patient in adding a couple more clicks in each Prosthesis   In // Bars:  Sit to partial stand with max A+1 x several attempts- Patient able to lift himself with max assist and BUE support on rails of walker.   Forward trunk lean in // bars   Therapeutic exercises:  Seated Hip march 2 sets of 10 reps Seated knee ext 2 sets of 10 reps Seated hip add squeeze with ball- hold 5 sec x 10 reps    HOME EXERCISE PROGRAM: Access Code: [email or web address] URL: [email or web address] Date: 12/20/2022 Prepared by: Maureen Ralphs  Exercises - Supine Quad Set (BKA)  - 1 x daily - 3 x weekly - 3 sets - 10 reps - Sidelying Hip Abduction wtih Flexion and Extension (BKA)  - 1 x daily - 3 x weekly - 3 sets - 10 reps - Supine Single Leg Bridge with Sound Leg (BKA)  - 1 x daily - 3 x weekly - 3 sets - 10 reps - Supine Hip Extension with Towel Roll (BKA)  - 1 x daily - 3 x weekly - 3 sets - 10 reps -  Hooklying Clamshell with Resistance  - 1 x daily - 3 x weekly - 3 sets - 10 reps    -determination of a schedule at home for wearing of prosthese and shrinkers; recommended on 3x daily for prostheses with ad lib increase in time. -discussed pros and cons of shrink use/disuse overnight   ASSESSMENT:  CLINICAL IMPRESSION: Continued with functional training and attempted standing in // bars with max A + 1. Patient was only able to achieve partial standing today and relied heavily on his strong UE for support- Difficulty leaning forward enough to transfer weight anteriorly but seat of chair is really low and patient stated he had to put his seat back into more dump position for fear he might fall out on [place] rides. He continues to be motivated. Will attempt standing with use of high mat table and use of walker in next couple of visit to develop more strength. He will benefit from skilled PT services to improve his overall LE strength, transfer ability, and possibly able to return to walking with prosthetic training for improved mobility and quality of life.    OBJECTIVE IMPAIRMENTS: decreased activity tolerance, decreased balance, decreased endurance, difficulty walking, decreased strength, and obesity.   ACTIVITY LIMITATIONS: carrying, lifting, bending, standing, squatting, stairs, transfers, bed mobility, bathing, toileting, and dressing  PARTICIPATION LIMITATIONS: cleaning, driving, shopping, community activity, occupation, and yard work  PERSONAL FACTORS: Time since onset of injury/illness/exacerbation and 3+ comorbidities: DM, COPD, PAD  are also affecting patient's functional outcome.   REHAB POTENTIAL: Good  CLINICAL DECISION MAKING: Evolving/moderate complexity  EVALUATION COMPLEXITY: Moderate   GOALS: Goals reviewed with patient? Yes  SHORT TERM GOALS: Target date: 01/09/2023  LONG TERM GOALS: Target date: 02/20/2023   1.  Patient will demonstrate modified independent with  all bed mobility (sup<-> sit)  for optimal function at home  Baseline: EVAL - Will assess 2nd visit; 01/08/2023- patient able to roll with assist of wife Goal status: ONGOING  2.  Patient will increase FOTO score to equal to or greater than  52 to demonstrate statistically significant improvement in mobility and quality of life.  Baseline: EVAL = 41 Goal status: INITIAL   3.  Patient will demonstrate all slide board transfers with Min assist (Bed <-> chair)   Baseline: EVAL= Patient unable to transfer from chair to bed due to increased musle weakness; 01/08/2023- Patient continues to require hoyer lift for all transfers Goal status: INITIAL       PLAN:  PT FREQUENCY: 1-2x/week  PT DURATION: 12 weeks  PLANNED INTERVENTIONS: Therapeutic exercises, Therapeutic activity, Neuromuscular re-education, Balance training, Gait training, Patient/Family education, Self Care, Joint mobilization, Joint manipulation, Vestibular training, Canalith repositioning, Prosthetic training, DME instructions, Dry Needling, Electrical stimulation, Wheelchair mobility training, Spinal manipulation, Spinal mobilization, Cryotherapy, Moist heat, Compression bandaging, scar mobilization, Manual therapy, and Re-evaluation  PLAN FOR NEXT SESSION:  Continue to progress LE strengthening and therapeutic activities/prosthetic training.  Functional standing to prepare for transfers and potential walking in future.    5:13 PM, 02/07/23 Louis Meckel, PT Physical Therapist - [facility]  Outpatient Physical Therapy- Main Campus [phone]     Lenda Kelp, PT 02/07/2023, 5:13 PM

## 2023-02-08 ENCOUNTER — Ambulatory Visit (INDEPENDENT_AMBULATORY_CARE_PROVIDER_SITE_OTHER): Payer: Medicare Other | Admitting: Vascular Surgery

## 2023-02-11 ENCOUNTER — Ambulatory Visit: Payer: Medicare Other

## 2023-02-11 DIAGNOSIS — R2689 Other abnormalities of gait and mobility: Secondary | ICD-10-CM

## 2023-02-11 DIAGNOSIS — R269 Unspecified abnormalities of gait and mobility: Secondary | ICD-10-CM

## 2023-02-11 DIAGNOSIS — R262 Difficulty in walking, not elsewhere classified: Secondary | ICD-10-CM | POA: Diagnosis not present

## 2023-02-11 DIAGNOSIS — R2681 Unsteadiness on feet: Secondary | ICD-10-CM

## 2023-02-11 DIAGNOSIS — M6281 Muscle weakness (generalized): Secondary | ICD-10-CM

## 2023-02-11 DIAGNOSIS — R278 Other lack of coordination: Secondary | ICD-10-CM

## 2023-02-11 NOTE — Therapy (Signed)
OUTPATIENT PHYSICAL THERAPY TREATMENT    Patient Name: [name]. MRN: [id number] DOB:12-Apr-1956, 67 y.o., male Today's Date: 02/11/2023  END OF SESSION:  PT End of Session - 02/11/23 1547     Visit Number 17    Number of Visits 24    Date for PT Re-Evaluation 02/20/23    Authorization Type [employer] Medicare    Progress Note Due on Visit 20    PT Start Time 1422    PT Stop Time 1444    PT Time Calculation (min) 22 min    Equipment Utilized During Treatment Gait belt    Activity Tolerance Patient tolerated treatment well;No increased pain;Patient limited by fatigue    Behavior During Therapy [facility] for tasks assessed/performed                     Past Medical History:  Diagnosis Date   Arrhythmia    atrial fibrillation   Asthma    CHF (congestive heart failure) (HCC)    COPD (chronic obstructive pulmonary disease) (HCC)    Coronary artery disease    Depression    Diabetes mellitus without complication (HCC)    Gout    History anabolic steroid use    Hyperlipidemia    Hypertension    Hypogonadism in male    MI (myocardial infarction) (HCC)    Morbid obesity (HCC)    Myocardial infarction (HCC)    Peripheral vascular disease (HCC)    Perirectal abscess    Pleurisy    Sleep apnea    CPAP at night, no oxygen   Varicella    Past Surgical History:  Procedure Laterality Date   ABDOMINAL AORTIC ANEURYSM REPAIR     ACHILLES TENDON SURGERY Left 01/10/2021   Procedure: ACHILLES LENGTHENING/KIDNER;  Surgeon: Rosetta Posner, DPM;  Location: [facility];  Service: Podiatry;  Laterality: Left;   AMPUTATION Left 10/03/2021   Procedure: AMPUTATION BELOW KNEE;  Surgeon: Renford Dills, MD;  Location: [facility];  Service: Vascular;  Laterality: Left;   AMPUTATION Right 05/24/2022   Procedure: AMPUTATION BELOW KNEE;  Surgeon: Annice Needy, MD;  Location: [facility];  Service: General;  Laterality: Right;   AMPUTATION TOE Right 02/10/2016   Procedure: AMPUTATION  TOE 3RD TOE;  Surgeon: Gwyneth Revels, DPM;  Location: [facility];  Service: Podiatry;  Laterality: Right;   AMPUTATION TOE Left 02/24/2020   Procedure: AMPUTATION TOE;  Surgeon: Rosetta Posner, DPM;  Location: [facility];  Service: Podiatry;  Laterality: Left;   APPLICATION OF WOUND VAC Left 02/29/2020   Procedure: APPLICATION OF WOUND VAC;  Surgeon: Rosetta Posner, DPM;  Location: [facility];  Service: Podiatry;  Laterality: Left;   CARDIAC CATHETERIZATION     CARDIOVERSION [name] 02/13/2022   Procedure: CARDIOVERSION;  Surgeon: Lamar Blinks, MD;  Location: [facility];  Service: Cardiovascular;  Laterality: [name];   COLONOSCOPY WITH PROPOFOL [name] 11/18/2015   Procedure: COLONOSCOPY WITH PROPOFOL;  Surgeon: Scot Jun, MD;  Location: [facility];  Service: Endoscopy;  Laterality: [name];   CORONARY ARTERY BYPASS GRAFT     CORONARY STENT INTERVENTION [name] 02/02/2020   Procedure: CORONARY STENT INTERVENTION;  Surgeon: Marcina Millard, MD;  Location: [facility];  Service: Cardiovascular;  Laterality: [name];   INCISION AND DRAINAGE Left 08/07/2021   Procedure: INCISION AND DRAINAGE-Partial Calcanectomy;  Surgeon: Rosetta Posner, DPM;  Location: [facility];  Service: Podiatry;  Laterality: Left;   IRRIGATION AND DEBRIDEMENT FOOT Left 02/29/2020   Procedure: IRRIGATION AND  DEBRIDEMENT FOOT;  Surgeon: Rosetta Posner, DPM;  Location: [facility];  Service: Podiatry;  Laterality: Left;   IRRIGATION AND DEBRIDEMENT FOOT Left 02/24/2020   Procedure: IRRIGATION AND DEBRIDEMENT FOOT;  Surgeon: Rosetta Posner, DPM;  Location: [facility];  Service: Podiatry;  Laterality: Left;   KNEE ARTHROSCOPY     LEFT HEART CATH AND CORS/GRAFTS ANGIOGRAPHY [name] 02/02/2020   Procedure: LEFT HEART CATH AND CORS/GRAFTS ANGIOGRAPHY;  Surgeon: Dalia Heading, MD;  Location: [facility];  Service: Cardiovascular;  Laterality: [name];   LOWER EXTREMITY ANGIOGRAPHY Left 02/25/2020   Procedure: Lower Extremity Angiography;   Surgeon: Annice Needy, MD;  Location: [facility];  Service: Cardiovascular;  Laterality: Left;   LOWER EXTREMITY ANGIOGRAPHY Left 01/04/2021   Procedure: LOWER EXTREMITY ANGIOGRAPHY;  Surgeon: Annice Needy, MD;  Location: [facility];  Service: Cardiovascular;  Laterality: Left;   LOWER EXTREMITY ANGIOGRAPHY Right 05/21/2022   Procedure: Lower Extremity Angiography;  Surgeon: Annice Needy, MD;  Location: [facility];  Service: Cardiovascular;  Laterality: Right;   METATARSAL HEAD EXCISION Left 01/10/2021   Procedure: METATARSAL HEAD EXCISION - LEFT 5th;  Surgeon: Rosetta Posner, DPM;  Location: [facility];  Service: Podiatry;  Laterality: Left;   PERIPHERAL VASCULAR CATHETERIZATION Right 01/24/2016   Procedure: Lower Extremity Angiography;  Surgeon: Renford Dills, MD;  Location: [facility];  Service: Cardiovascular;  Laterality: Right;   PERIPHERAL VASCULAR CATHETERIZATION Right 01/25/2016   Procedure: Lower Extremity Angiography;  Surgeon: Renford Dills, MD;  Location: [facility];  Service: Cardiovascular;  Laterality: Right;   TOE AMPUTATION     TONSILLECTOMY     Patient Active Problem List   Diagnosis Date Noted   Status post below-knee amputation of right lower extremity (HCC) 05/31/2022   S/P amputation 05/30/2022   Poorly controlled diabetes mellitus (HCC) 05/30/2022   Acute osteomyelitis of right foot (HCC) 05/19/2022   Wound infection 05/18/2022   Ulcer of right ankle, with fat layer exposed (HCC) 05/18/2022   Non-healing wound of lower extremity, initial encounter 05/18/2022   CAD with Hx of CABG 05/18/2022   Chronic respiratory failure with hypoxia (HCC) 05/18/2022   S/P BKA (below knee amputation), left (HCC) 05/18/2022   COPD (chronic obstructive pulmonary disease) (HCC) 05/18/2022   Perinephric hematoma 03/20/2022   Bacteremia due to Klebsiella pneumoniae 03/19/2022   Hyperglycemia 03/16/2022   Septic shock (HCC)  03/16/2022   Sepsis secondary to UTI (HCC) 03/15/2022   Acute hypoxemic respiratory failure (HCC) 02/13/2022   Atrial fibrillation with rapid ventricular response (HCC) 02/11/2022   Uncontrolled type 2 diabetes mellitus with hyperglycemia, with long-term current use of insulin (HCC) 02/11/2022   Dyslipidemia 02/11/2022   Hypothyroidism 02/11/2022   Benign prostatic hyperplasia with lower urinary tract symptoms 02/11/2022   Pressure injury of skin of heel 11/04/2021   UTI (urinary tract infection) 10/22/2021   Iron deficiency anemia 10/19/2021   Hyperkalemia 10/14/2021   Acute urinary retention 10/08/2021   Sepsis due to methicillin resistant Staphylococcus aureus (MRSA) (HCC) 10/04/2021   Acute delirium 10/04/2021   Acute on chronic respiratory failure with hypoxia and hypercapnia (HCC) 10/04/2021   Acute respiratory failure with hypoxia and hypercapnia (HCC) 10/03/2021   OSA (obstructive sleep apnea) 10/03/2021   Malnutrition of moderate degree 09/29/2021   Generalized weakness 09/29/2021   Cellulitis of left foot 08/04/2021   Uncontrolled type 2 diabetes mellitus with hypoglycemia, with long-term current use of insulin (HCC) 08/04/2021  Bilateral lower extremity edema 08/04/2021   Acute osteomyelitis of lower leg, left (HCC) 08/04/2021   Swelling of limb 07/18/2021   CHF exacerbation (HCC) 05/08/2021   Diabetes mellitus without complication (HCC)    Atrial fibrillation with RVR (HCC)    Obesity, Class III, BMI 40-49.9 (morbid obesity) (HCC)    Venous stasis ulcer (HCC)    Sinus tachycardia    BPH (benign prostatic hyperplasia)    Chronic anticoagulation    Anasarca    Chronic diastolic CHF (congestive heart failure) (HCC)    Venous stasis of both lower extremities    AF (paroxysmal atrial fibrillation) (HCC)    Lower extremity cellulitis 01/03/2021   Chronic osteomyelitis of left foot (HCC) 01/03/2021   Osteomyelitis (HCC) 01/03/2021   Severe sepsis (HCC) 02/24/2020   AKI  (acute kidney injury) (HCC) 02/24/2020   Bilateral cellulitis of lower leg 02/23/2020   CAD (coronary artery disease) 02/02/2020   RLS (restless legs syndrome) 05/15/2019   Statin myopathy 05/15/2019   Type 2 diabetes mellitus with obesity (HCC) 02/13/2016   Essential hypertension 02/13/2016   PAD (peripheral artery disease) (HCC) 02/13/2016   Pressure injury of skin 01/25/2016   Hypoglycemia associated with diabetes (HCC) 12/27/2013   Long-term insulin use (HCC) 12/27/2013   Microalbuminuria 12/27/2013   B12 deficiency 09/29/2013   Obesity 09/29/2013   CAD (coronary artery disease), autologous vein bypass graft 07/20/2013   Hypersomnia with sleep apnea 07/20/2013   Pure hypercholesterolemia 07/20/2013   Osteomyelitis of ankle or foot 07/20/2011    PCP: Dr. Aram Beecham  REFERRING PROVIDER: Sheppard Plumber, NP  REFERRING DIAG: [phone] (ICD-10-CM) - Status post below-knee amputation of right lower extremity (HCC)   THERAPY DIAG:  Difficulty in walking, not elsewhere classified  Muscle weakness (generalized)  Other abnormalities of gait and mobility  Other lack of coordination  Abnormality of gait and mobility  Unsteadiness on feet  Rationale for Evaluation and Treatment: Rehabilitation  ONSET DATE: 05/24/2022 (date of surgery for latest Below knee amputation- RLE)   SUBJECTIVE:   SUBJECTIVE STATEMENT: Patient reports doing okay- didn't do anything special for his birthday. Reports wife was only able to get 1 click each LE.      PERTINENT HISTORY: Patient is a 67 year old male with CAD/PAF on Eliquis followed by Cardiology.. Obese with BMI>38. HTN stable on meds. Has HLD not on statin due to myopathy. Also with DM on insulin and thyroid dz on Synthroid. (See above section for all PMH) Patient reports he is eager to be here and a long road to recovery- Reports having his left leg amputated last year (10/03/2021) and most recently his right LE - both below the knee  amputations. Reports very dependent with all mobility and states ready to get stronger and on his feet. States just received his prosthesis yesterday.   Patient using [facility] for prosthetic care PAIN:  Are you having pain? No  PRECAUTIONS: Fall  RED FLAGS: None   WEIGHT BEARING RESTRICTIONS: No  FALLS:  Has patient fallen in last 6 months?  Will ask next visit  LIVING ENVIRONMENT: Lives with: lives with their spouse Lives in: House/apartment Stairs:  split level home - 2 step down to den- ramp for entry in home.  Has following equipment at home: Quad cane large base, Environmental consultant - 2 wheeled, Wheelchair (manual), [employer], Ramped entry, and [employer], hoyer lift  OCCUPATION: Psychologist, occupational- retired  PLOF: Independent- prior to 10/03/2021 (left BKA)   PATIENT GOALS: "I want to walk  with my prostheses"  NEXT MD VISIT: TBA  OBJECTIVE:   TODAY'S TREATMENT:                                                                                                                              DATE: 02/11/23        Therapeutic activities: - assisted patient in adding a couple more clicks in each Prosthesis   Dependent hoyer transfer - W/c to edge of mat table  Several trials of sit to stand then static stand today- Max A with all sit to stand today -1st trial with PT on left side and able to stand approx 3/4 full standing- unable to terminally extend knees or keep gluteals activated. Able to stand approx 33 sec -2nd trial with PT on right side- increased difficulty engaging quads -3rd trial- initially difficulty standing yet after a few seconds of struggling he was able to extend trunk and ant weight shift - standing more erect- near full erect with improved quad control and knee extention- able to stand approx 46 sec yet much improved than any other stand to date.    HOME EXERCISE PROGRAM: Access Code: [email or web address] URL: [email or web address] Date: 12/20/2022 Prepared by: Maureen Ralphs  Exercises - Supine Quad Set (BKA)  - 1 x daily - 3 x weekly - 3 sets - 10 reps - Sidelying Hip Abduction wtih Flexion and Extension (BKA)  - 1 x daily - 3 x weekly - 3 sets - 10 reps - Supine Single Leg Bridge with Sound Leg (BKA)  - 1 x daily - 3 x weekly - 3 sets - 10 reps - Supine Hip Extension with Towel Roll (BKA)  - 1 x daily - 3 x weekly - 3 sets - 10 reps - Hooklying Clamshell with Resistance  - 1 x daily - 3 x weekly - 3 sets - 10 reps    -determination of a schedule at home for wearing of prosthese and shrinkers; recommended on 3x daily for prostheses with ad lib increase in time. -discussed pros and cons of shrink use/disuse overnight   ASSESSMENT:  CLINICAL IMPRESSION: Treatment very limited secondary to patient late to arrive- transportation issues. Despite short session, He was able to demo his best stand to date without use of any devices (other than walker to stand) - no litegait (un-weighing) or sit to stand lift today. He had initial difficulty standing due to LE muscle weakness but demo som in session improvement- on last stand able to stand with improved gluteal and quad activation and stood over 6 ft tall without knee buckling.  He will benefit from skilled PT services to improve his overall LE strength, transfer ability, and possibly able to return to walking with prosthetic training for improved mobility and quality of life.    OBJECTIVE IMPAIRMENTS: decreased activity tolerance, decreased balance, decreased endurance, difficulty walking, decreased strength, and obesity.   ACTIVITY LIMITATIONS: carrying, lifting, bending, standing, squatting, stairs,  transfers, bed mobility, bathing, toileting, and dressing  PARTICIPATION LIMITATIONS: cleaning, driving, shopping, community activity, occupation, and yard work  PERSONAL FACTORS: Time since onset of injury/illness/exacerbation and 3+ comorbidities: DM, COPD, PAD  are also affecting patient's functional  outcome.   REHAB POTENTIAL: Good  CLINICAL DECISION MAKING: Evolving/moderate complexity  EVALUATION COMPLEXITY: Moderate   GOALS: Goals reviewed with patient? Yes  SHORT TERM GOALS: Target date: 01/09/2023  LONG TERM GOALS: Target date: 02/20/2023   1.  Patient will demonstrate modified independent with all bed mobility (sup<-> sit)  for optimal function at home  Baseline: EVAL - Will assess 2nd visit; 01/08/2023- patient able to roll with assist of wife Goal status: ONGOING  2.  Patient will increase FOTO score to equal to or greater than  52 to demonstrate statistically significant improvement in mobility and quality of life.  Baseline: EVAL = 41 Goal status: INITIAL   3.  Patient will demonstrate all slide board transfers with Min assist (Bed <-> chair)   Baseline: EVAL= Patient unable to transfer from chair to bed due to increased musle weakness; 01/08/2023- Patient continues to require hoyer lift for all transfers Goal status: INITIAL       PLAN:  PT FREQUENCY: 1-2x/week  PT DURATION: 12 weeks  PLANNED INTERVENTIONS: Therapeutic exercises, Therapeutic activity, Neuromuscular re-education, Balance training, Gait training, Patient/Family education, Self Care, Joint mobilization, Joint manipulation, Vestibular training, Canalith repositioning, Prosthetic training, DME instructions, Dry Needling, Electrical stimulation, Wheelchair mobility training, Spinal manipulation, Spinal mobilization, Cryotherapy, Moist heat, Compression bandaging, scar mobilization, Manual therapy, and Re-evaluation  PLAN FOR NEXT SESSION:  Continue to progress LE strengthening and therapeutic activities/prosthetic training.  Functional standing to prepare for transfers and potential walking in future.    3:49 PM, 02/11/23 Louis Meckel, PT Physical Therapist - [facility]  [name] [phone]     Lenda Kelp,  PT 02/11/2023, 3:49 PM

## 2023-02-14 ENCOUNTER — Ambulatory Visit: Payer: Medicare Other

## 2023-02-14 DIAGNOSIS — M6281 Muscle weakness (generalized): Secondary | ICD-10-CM

## 2023-02-14 DIAGNOSIS — R278 Other lack of coordination: Secondary | ICD-10-CM

## 2023-02-14 DIAGNOSIS — R262 Difficulty in walking, not elsewhere classified: Secondary | ICD-10-CM

## 2023-02-14 DIAGNOSIS — R2689 Other abnormalities of gait and mobility: Secondary | ICD-10-CM

## 2023-02-14 DIAGNOSIS — R269 Unspecified abnormalities of gait and mobility: Secondary | ICD-10-CM

## 2023-02-15 NOTE — Therapy (Signed)
OUTPATIENT PHYSICAL THERAPY TREATMENT    Patient Name: [name]. MRN: [id number] DOB:26-Sep-1955, 67 y.o., male Today's Date: 02/15/2023  END OF SESSION:  PT End of Session - 02/14/23 1126     Visit Number 18    Number of Visits 24    Date for PT Re-Evaluation 02/20/23    Authorization Type [employer] Medicare    Progress Note Due on Visit 20    PT Start Time 1535    PT Stop Time 1614    PT Time Calculation (min) 39 min    Equipment Utilized During Treatment Gait belt    Activity Tolerance Patient tolerated treatment well;No increased pain;Patient limited by fatigue    Behavior During Therapy [facility] for tasks assessed/performed                     Past Medical History:  Diagnosis Date   Arrhythmia    atrial fibrillation   Asthma    CHF (congestive heart failure) (HCC)    COPD (chronic obstructive pulmonary disease) (HCC)    Coronary artery disease    Depression    Diabetes mellitus without complication (HCC)    Gout    History anabolic steroid use    Hyperlipidemia    Hypertension    Hypogonadism in male    MI (myocardial infarction) (HCC)    Morbid obesity (HCC)    Myocardial infarction (HCC)    Peripheral vascular disease (HCC)    Perirectal abscess    Pleurisy    Sleep apnea    CPAP at night, no oxygen   Varicella    Past Surgical History:  Procedure Laterality Date   ABDOMINAL AORTIC ANEURYSM REPAIR     ACHILLES TENDON SURGERY Left 01/10/2021   Procedure: ACHILLES LENGTHENING/KIDNER;  Surgeon: Rosetta Posner, DPM;  Location: [facility];  Service: Podiatry;  Laterality: Left;   AMPUTATION Left 10/03/2021   Procedure: AMPUTATION BELOW KNEE;  Surgeon: Renford Dills, MD;  Location: [facility];  Service: Vascular;  Laterality: Left;   AMPUTATION Right 05/24/2022   Procedure: AMPUTATION BELOW KNEE;  Surgeon: Annice Needy, MD;  Location: [facility];  Service: General;  Laterality: Right;   AMPUTATION TOE Right 02/10/2016   Procedure: AMPUTATION  TOE 3RD TOE;  Surgeon: Gwyneth Revels, DPM;  Location: [facility];  Service: Podiatry;  Laterality: Right;   AMPUTATION TOE Left 02/24/2020   Procedure: AMPUTATION TOE;  Surgeon: Rosetta Posner, DPM;  Location: [facility];  Service: Podiatry;  Laterality: Left;   APPLICATION OF WOUND VAC Left 02/29/2020   Procedure: APPLICATION OF WOUND VAC;  Surgeon: Rosetta Posner, DPM;  Location: [facility];  Service: Podiatry;  Laterality: Left;   CARDIAC CATHETERIZATION     CARDIOVERSION [name] 02/13/2022   Procedure: CARDIOVERSION;  Surgeon: Lamar Blinks, MD;  Location: [facility];  Service: Cardiovascular;  Laterality: [name];   COLONOSCOPY WITH PROPOFOL [name] 11/18/2015   Procedure: COLONOSCOPY WITH PROPOFOL;  Surgeon: Scot Jun, MD;  Location: [facility];  Service: Endoscopy;  Laterality: [name];   CORONARY ARTERY BYPASS GRAFT     CORONARY STENT INTERVENTION [name] 02/02/2020   Procedure: CORONARY STENT INTERVENTION;  Surgeon: Marcina Millard, MD;  Location: [facility];  Service: Cardiovascular;  Laterality: [name];   INCISION AND DRAINAGE Left 08/07/2021   Procedure: INCISION AND DRAINAGE-Partial Calcanectomy;  Surgeon: Rosetta Posner, DPM;  Location: [facility];  Service: Podiatry;  Laterality: Left;   IRRIGATION AND DEBRIDEMENT FOOT Left 02/29/2020   Procedure: IRRIGATION AND  DEBRIDEMENT FOOT;  Surgeon: Rosetta Posner, DPM;  Location: [facility];  Service: Podiatry;  Laterality: Left;   IRRIGATION AND DEBRIDEMENT FOOT Left 02/24/2020   Procedure: IRRIGATION AND DEBRIDEMENT FOOT;  Surgeon: Rosetta Posner, DPM;  Location: [facility];  Service: Podiatry;  Laterality: Left;   KNEE ARTHROSCOPY     LEFT HEART CATH AND CORS/GRAFTS ANGIOGRAPHY [name] 02/02/2020   Procedure: LEFT HEART CATH AND CORS/GRAFTS ANGIOGRAPHY;  Surgeon: Dalia Heading, MD;  Location: [facility];  Service: Cardiovascular;  Laterality: [name];   LOWER EXTREMITY ANGIOGRAPHY Left 02/25/2020   Procedure: Lower Extremity Angiography;   Surgeon: Annice Needy, MD;  Location: [facility];  Service: Cardiovascular;  Laterality: Left;   LOWER EXTREMITY ANGIOGRAPHY Left 01/04/2021   Procedure: LOWER EXTREMITY ANGIOGRAPHY;  Surgeon: Annice Needy, MD;  Location: [facility];  Service: Cardiovascular;  Laterality: Left;   LOWER EXTREMITY ANGIOGRAPHY Right 05/21/2022   Procedure: Lower Extremity Angiography;  Surgeon: Annice Needy, MD;  Location: [facility];  Service: Cardiovascular;  Laterality: Right;   METATARSAL HEAD EXCISION Left 01/10/2021   Procedure: METATARSAL HEAD EXCISION - LEFT 5th;  Surgeon: Rosetta Posner, DPM;  Location: [facility];  Service: Podiatry;  Laterality: Left;   PERIPHERAL VASCULAR CATHETERIZATION Right 01/24/2016   Procedure: Lower Extremity Angiography;  Surgeon: Renford Dills, MD;  Location: [facility];  Service: Cardiovascular;  Laterality: Right;   PERIPHERAL VASCULAR CATHETERIZATION Right 01/25/2016   Procedure: Lower Extremity Angiography;  Surgeon: Renford Dills, MD;  Location: [facility];  Service: Cardiovascular;  Laterality: Right;   TOE AMPUTATION     TONSILLECTOMY     Patient Active Problem List   Diagnosis Date Noted   Status post below-knee amputation of right lower extremity (HCC) 05/31/2022   S/P amputation 05/30/2022   Poorly controlled diabetes mellitus (HCC) 05/30/2022   Acute osteomyelitis of right foot (HCC) 05/19/2022   Wound infection 05/18/2022   Ulcer of right ankle, with fat layer exposed (HCC) 05/18/2022   Non-healing wound of lower extremity, initial encounter 05/18/2022   CAD with Hx of CABG 05/18/2022   Chronic respiratory failure with hypoxia (HCC) 05/18/2022   S/P BKA (below knee amputation), left (HCC) 05/18/2022   COPD (chronic obstructive pulmonary disease) (HCC) 05/18/2022   Perinephric hematoma 03/20/2022   Bacteremia due to Klebsiella pneumoniae 03/19/2022   Hyperglycemia 03/16/2022   Septic shock (HCC)  03/16/2022   Sepsis secondary to UTI (HCC) 03/15/2022   Acute hypoxemic respiratory failure (HCC) 02/13/2022   Atrial fibrillation with rapid ventricular response (HCC) 02/11/2022   Uncontrolled type 2 diabetes mellitus with hyperglycemia, with long-term current use of insulin (HCC) 02/11/2022   Dyslipidemia 02/11/2022   Hypothyroidism 02/11/2022   Benign prostatic hyperplasia with lower urinary tract symptoms 02/11/2022   Pressure injury of skin of heel 11/04/2021   UTI (urinary tract infection) 10/22/2021   Iron deficiency anemia 10/19/2021   Hyperkalemia 10/14/2021   Acute urinary retention 10/08/2021   Sepsis due to methicillin resistant Staphylococcus aureus (MRSA) (HCC) 10/04/2021   Acute delirium 10/04/2021   Acute on chronic respiratory failure with hypoxia and hypercapnia (HCC) 10/04/2021   Acute respiratory failure with hypoxia and hypercapnia (HCC) 10/03/2021   OSA (obstructive sleep apnea) 10/03/2021   Malnutrition of moderate degree 09/29/2021   Generalized weakness 09/29/2021   Cellulitis of left foot 08/04/2021   Uncontrolled type 2 diabetes mellitus with hypoglycemia, with long-term current use of insulin (HCC) 08/04/2021  Bilateral lower extremity edema 08/04/2021   Acute osteomyelitis of lower leg, left (HCC) 08/04/2021   Swelling of limb 07/18/2021   CHF exacerbation (HCC) 05/08/2021   Diabetes mellitus without complication (HCC)    Atrial fibrillation with RVR (HCC)    Obesity, Class III, BMI 40-49.9 (morbid obesity) (HCC)    Venous stasis ulcer (HCC)    Sinus tachycardia    BPH (benign prostatic hyperplasia)    Chronic anticoagulation    Anasarca    Chronic diastolic CHF (congestive heart failure) (HCC)    Venous stasis of both lower extremities    AF (paroxysmal atrial fibrillation) (HCC)    Lower extremity cellulitis 01/03/2021   Chronic osteomyelitis of left foot (HCC) 01/03/2021   Osteomyelitis (HCC) 01/03/2021   Severe sepsis (HCC) 02/24/2020   AKI  (acute kidney injury) (HCC) 02/24/2020   Bilateral cellulitis of lower leg 02/23/2020   CAD (coronary artery disease) 02/02/2020   RLS (restless legs syndrome) 05/15/2019   Statin myopathy 05/15/2019   Type 2 diabetes mellitus with obesity (HCC) 02/13/2016   Essential hypertension 02/13/2016   PAD (peripheral artery disease) (HCC) 02/13/2016   Pressure injury of skin 01/25/2016   Hypoglycemia associated with diabetes (HCC) 12/27/2013   Long-term insulin use (HCC) 12/27/2013   Microalbuminuria 12/27/2013   B12 deficiency 09/29/2013   Obesity 09/29/2013   CAD (coronary artery disease), autologous vein bypass graft 07/20/2013   Hypersomnia with sleep apnea 07/20/2013   Pure hypercholesterolemia 07/20/2013   Osteomyelitis of ankle or foot 07/20/2011    PCP: Dr. Aram Beecham  REFERRING PROVIDER: Sheppard Plumber, NP  REFERRING DIAG: [phone] (ICD-10-CM) - Status post below-knee amputation of right lower extremity (HCC)   THERAPY DIAG:  Difficulty in walking, not elsewhere classified  Muscle weakness (generalized)  Other abnormalities of gait and mobility  Other lack of coordination  Abnormality of gait and mobility  Rationale for Evaluation and Treatment: Rehabilitation  ONSET DATE: 05/24/2022 (date of surgery for latest Below knee amputation- RLE)   SUBJECTIVE:   SUBJECTIVE STATEMENT: Patient reports doing okay- didn't do anything special for his birthday. Reports wife was only able to get 1 click each LE.      PERTINENT HISTORY: Patient is a 67 year old male with CAD/PAF on Eliquis followed by Cardiology.. Obese with BMI>38. HTN stable on meds. Has HLD not on statin due to myopathy. Also with DM on insulin and thyroid dz on Synthroid. (See above section for all PMH) Patient reports he is eager to be here and a long road to recovery- Reports having his left leg amputated last year (10/03/2021) and most recently his right LE - both below the knee amputations. Reports very  dependent with all mobility and states ready to get stronger and on his feet. States just received his prosthesis yesterday.   Patient using [facility] for prosthetic care PAIN:  Are you having pain? No  PRECAUTIONS: Fall  RED FLAGS: None   WEIGHT BEARING RESTRICTIONS: No  FALLS:  Has patient fallen in last 6 months?  Will ask next visit  LIVING ENVIRONMENT: Lives with: lives with their spouse Lives in: House/apartment Stairs:  split level home - 2 step down to den- ramp for entry in home.  Has following equipment at home: Quad cane large base, Environmental consultant - 2 wheeled, Wheelchair (manual), [employer], Ramped entry, and [employer], hoyer lift  OCCUPATION: Psychologist, occupational- retired  PLOF: Independent- prior to 10/03/2021 (left BKA)   PATIENT GOALS: "I want to walk with my prostheses"  NEXT MD VISIT: TBA  OBJECTIVE:   TODAY'S TREATMENT:                                                                                                                              DATE: 02/14/2023        Therapeutic activities: - assisted patient in adding a couple more clicks in each Prosthesis   Dependent hoyer transfer - W/c to edge of mat table  Several trials of sit to stand then static stand today- Max A with all sit to stand today -Patient able to stand total of 5 trials Using mirror at his side for feedback- to see how erect he could stand. Patient exhibited near full standing with Max A - difficulty extending knees but better glueal activation today.   HOME EXERCISE PROGRAM: Access Code: [email or web address] URL: [email or web address] Date: 12/20/2022 Prepared by: Maureen Ralphs  Exercises - Supine Quad Set (BKA)  - 1 x daily - 3 x weekly - 3 sets - 10 reps - Sidelying Hip Abduction wtih Flexion and Extension (BKA)  - 1 x daily - 3 x weekly - 3 sets - 10 reps - Supine Single Leg Bridge with Sound Leg (BKA)  - 1 x daily - 3 x weekly - 3 sets - 10 reps - Supine Hip Extension with Towel Roll  (BKA)  - 1 x daily - 3 x weekly - 3 sets - 10 reps - Hooklying Clamshell with Resistance  - 1 x daily - 3 x weekly - 3 sets - 10 reps    -determination of a schedule at home for wearing of prosthese and shrinkers; recommended on 3x daily for prostheses with ad lib increase in time. -discussed pros and cons of shrink use/disuse overnight   ASSESSMENT:  CLINICAL IMPRESSION: Patient presents with good motivation today. He was engaging and worked very hard to stand as erect as he could. He required max assist to stand yet each stand he was able to gain confidence and stand more erect and although very UE heavy support- he was able to extend knee with max effort. Much improved standing ability overall today.  He will benefit from skilled PT services to improve his overall LE strength, transfer ability, and possibly able to return to walking with prosthetic training for improved mobility and quality of life.    OBJECTIVE IMPAIRMENTS: decreased activity tolerance, decreased balance, decreased endurance, difficulty walking, decreased strength, and obesity.   ACTIVITY LIMITATIONS: carrying, lifting, bending, standing, squatting, stairs, transfers, bed mobility, bathing, toileting, and dressing  PARTICIPATION LIMITATIONS: cleaning, driving, shopping, community activity, occupation, and yard work  PERSONAL FACTORS: Time since onset of injury/illness/exacerbation and 3+ comorbidities: DM, COPD, PAD  are also affecting patient's functional outcome.   REHAB POTENTIAL: Good  CLINICAL DECISION MAKING: Evolving/moderate complexity  EVALUATION COMPLEXITY: Moderate   GOALS: Goals reviewed with patient? Yes  SHORT TERM GOALS: Target date: 01/09/2023  LONG TERM GOALS: Target date: 02/20/2023  1.  Patient will demonstrate modified independent with all bed mobility (sup<-> sit)  for optimal function at home  Baseline: EVAL - Will assess 2nd visit; 01/08/2023- patient able to roll with assist of  wife Goal status: ONGOING  2.  Patient will increase FOTO score to equal to or greater than  52 to demonstrate statistically significant improvement in mobility and quality of life.  Baseline: EVAL = 41 Goal status: INITIAL   3.  Patient will demonstrate all slide board transfers with Min assist (Bed <-> chair)   Baseline: EVAL= Patient unable to transfer from chair to bed due to increased musle weakness; 01/08/2023- Patient continues to require hoyer lift for all transfers Goal status: INITIAL       PLAN:  PT FREQUENCY: 1-2x/week  PT DURATION: 12 weeks  PLANNED INTERVENTIONS: Therapeutic exercises, Therapeutic activity, Neuromuscular re-education, Balance training, Gait training, Patient/Family education, Self Care, Joint mobilization, Joint manipulation, Vestibular training, Canalith repositioning, Prosthetic training, DME instructions, Dry Needling, Electrical stimulation, Wheelchair mobility training, Spinal manipulation, Spinal mobilization, Cryotherapy, Moist heat, Compression bandaging, scar mobilization, Manual therapy, and Re-evaluation  PLAN FOR NEXT SESSION:  Continue to progress LE strengthening and therapeutic activities/prosthetic training.  Functional standing to prepare for transfers and potential walking in future.    11:29 AM, 02/15/23 Louis Meckel, PT Physical Therapist - [facility]  [facility] [place] [phone]     Lenda Kelp, PT 02/15/2023, 11:29 AM

## 2023-02-18 ENCOUNTER — Ambulatory Visit: Payer: Medicare Other

## 2023-02-18 DIAGNOSIS — R262 Difficulty in walking, not elsewhere classified: Secondary | ICD-10-CM | POA: Diagnosis not present

## 2023-02-18 DIAGNOSIS — M6281 Muscle weakness (generalized): Secondary | ICD-10-CM

## 2023-02-18 DIAGNOSIS — R2689 Other abnormalities of gait and mobility: Secondary | ICD-10-CM

## 2023-02-18 DIAGNOSIS — R269 Unspecified abnormalities of gait and mobility: Secondary | ICD-10-CM

## 2023-02-18 DIAGNOSIS — R278 Other lack of coordination: Secondary | ICD-10-CM

## 2023-02-18 DIAGNOSIS — R2681 Unsteadiness on feet: Secondary | ICD-10-CM

## 2023-02-18 NOTE — Therapy (Signed)
OUTPATIENT PHYSICAL THERAPY TREATMENT    Patient Name: [name]. MRN: [id number] DOB:31-Oct-1955, 67 y.o., male Today's Date: 02/18/2023  END OF SESSION:  PT End of Session - 02/18/23 1503     Visit Number 19    Number of Visits 24    Date for PT Re-Evaluation 02/20/23    Authorization Type [employer] Medicare    Progress Note Due on Visit 20    PT Start Time 1356    PT Stop Time 1440    PT Time Calculation (min) 44 min    Equipment Utilized During Treatment Gait belt    Activity Tolerance Patient tolerated treatment well;No increased pain;Patient limited by fatigue    Behavior During Therapy [facility] for tasks assessed/performed                      Past Medical History:  Diagnosis Date   Arrhythmia    atrial fibrillation   Asthma    CHF (congestive heart failure) (HCC)    COPD (chronic obstructive pulmonary disease) (HCC)    Coronary artery disease    Depression    Diabetes mellitus without complication (HCC)    Gout    History anabolic steroid use    Hyperlipidemia    Hypertension    Hypogonadism in male    MI (myocardial infarction) (HCC)    Morbid obesity (HCC)    Myocardial infarction (HCC)    Peripheral vascular disease (HCC)    Perirectal abscess    Pleurisy    Sleep apnea    CPAP at night, no oxygen   Varicella    Past Surgical History:  Procedure Laterality Date   ABDOMINAL AORTIC ANEURYSM REPAIR     ACHILLES TENDON SURGERY Left 01/10/2021   Procedure: ACHILLES LENGTHENING/KIDNER;  Surgeon: Rosetta Posner, DPM;  Location: [facility];  Service: Podiatry;  Laterality: Left;   AMPUTATION Left 10/03/2021   Procedure: AMPUTATION BELOW KNEE;  Surgeon: Renford Dills, MD;  Location: [facility];  Service: Vascular;  Laterality: Left;   AMPUTATION Right 05/24/2022   Procedure: AMPUTATION BELOW KNEE;  Surgeon: Annice Needy, MD;  Location: [facility];  Service: General;  Laterality: Right;   AMPUTATION TOE Right 02/10/2016   Procedure: AMPUTATION  TOE 3RD TOE;  Surgeon: Gwyneth Revels, DPM;  Location: [facility];  Service: Podiatry;  Laterality: Right;   AMPUTATION TOE Left 02/24/2020   Procedure: AMPUTATION TOE;  Surgeon: Rosetta Posner, DPM;  Location: [facility];  Service: Podiatry;  Laterality: Left;   APPLICATION OF WOUND VAC Left 02/29/2020   Procedure: APPLICATION OF WOUND VAC;  Surgeon: Rosetta Posner, DPM;  Location: [facility];  Service: Podiatry;  Laterality: Left;   CARDIAC CATHETERIZATION     CARDIOVERSION [name] 02/13/2022   Procedure: CARDIOVERSION;  Surgeon: Lamar Blinks, MD;  Location: [facility];  Service: Cardiovascular;  Laterality: [name];   COLONOSCOPY WITH PROPOFOL [name] 11/18/2015   Procedure: COLONOSCOPY WITH PROPOFOL;  Surgeon: Scot Jun, MD;  Location: [facility];  Service: Endoscopy;  Laterality: [name];   CORONARY ARTERY BYPASS GRAFT     CORONARY STENT INTERVENTION [name] 02/02/2020   Procedure: CORONARY STENT INTERVENTION;  Surgeon: Marcina Millard, MD;  Location: [facility];  Service: Cardiovascular;  Laterality: [name];   INCISION AND DRAINAGE Left 08/07/2021   Procedure: INCISION AND DRAINAGE-Partial Calcanectomy;  Surgeon: Rosetta Posner, DPM;  Location: [facility];  Service: Podiatry;  Laterality: Left;   IRRIGATION AND DEBRIDEMENT FOOT Left 02/29/2020   Procedure: IRRIGATION  AND DEBRIDEMENT FOOT;  Surgeon: Rosetta Posner, DPM;  Location: [facility];  Service: Podiatry;  Laterality: Left;   IRRIGATION AND DEBRIDEMENT FOOT Left 02/24/2020   Procedure: IRRIGATION AND DEBRIDEMENT FOOT;  Surgeon: Rosetta Posner, DPM;  Location: [facility];  Service: Podiatry;  Laterality: Left;   KNEE ARTHROSCOPY     LEFT HEART CATH AND CORS/GRAFTS ANGIOGRAPHY [name] 02/02/2020   Procedure: LEFT HEART CATH AND CORS/GRAFTS ANGIOGRAPHY;  Surgeon: Dalia Heading, MD;  Location: [facility];  Service: Cardiovascular;  Laterality: [name];   LOWER EXTREMITY ANGIOGRAPHY Left 02/25/2020   Procedure: Lower Extremity Angiography;   Surgeon: Annice Needy, MD;  Location: [facility];  Service: Cardiovascular;  Laterality: Left;   LOWER EXTREMITY ANGIOGRAPHY Left 01/04/2021   Procedure: LOWER EXTREMITY ANGIOGRAPHY;  Surgeon: Annice Needy, MD;  Location: [facility];  Service: Cardiovascular;  Laterality: Left;   LOWER EXTREMITY ANGIOGRAPHY Right 05/21/2022   Procedure: Lower Extremity Angiography;  Surgeon: Annice Needy, MD;  Location: [facility];  Service: Cardiovascular;  Laterality: Right;   METATARSAL HEAD EXCISION Left 01/10/2021   Procedure: METATARSAL HEAD EXCISION - LEFT 5th;  Surgeon: Rosetta Posner, DPM;  Location: [facility];  Service: Podiatry;  Laterality: Left;   PERIPHERAL VASCULAR CATHETERIZATION Right 01/24/2016   Procedure: Lower Extremity Angiography;  Surgeon: Renford Dills, MD;  Location: [facility];  Service: Cardiovascular;  Laterality: Right;   PERIPHERAL VASCULAR CATHETERIZATION Right 01/25/2016   Procedure: Lower Extremity Angiography;  Surgeon: Renford Dills, MD;  Location: [facility];  Service: Cardiovascular;  Laterality: Right;   TOE AMPUTATION     TONSILLECTOMY     Patient Active Problem List   Diagnosis Date Noted   Status post below-knee amputation of right lower extremity (HCC) 05/31/2022   S/P amputation 05/30/2022   Poorly controlled diabetes mellitus (HCC) 05/30/2022   Acute osteomyelitis of right foot (HCC) 05/19/2022   Wound infection 05/18/2022   Ulcer of right ankle, with fat layer exposed (HCC) 05/18/2022   Non-healing wound of lower extremity, initial encounter 05/18/2022   CAD with Hx of CABG 05/18/2022   Chronic respiratory failure with hypoxia (HCC) 05/18/2022   S/P BKA (below knee amputation), left (HCC) 05/18/2022   COPD (chronic obstructive pulmonary disease) (HCC) 05/18/2022   Perinephric hematoma 03/20/2022   Bacteremia due to Klebsiella pneumoniae 03/19/2022   Hyperglycemia 03/16/2022   Septic shock (HCC)  03/16/2022   Sepsis secondary to UTI (HCC) 03/15/2022   Acute hypoxemic respiratory failure (HCC) 02/13/2022   Atrial fibrillation with rapid ventricular response (HCC) 02/11/2022   Uncontrolled type 2 diabetes mellitus with hyperglycemia, with long-term current use of insulin (HCC) 02/11/2022   Dyslipidemia 02/11/2022   Hypothyroidism 02/11/2022   Benign prostatic hyperplasia with lower urinary tract symptoms 02/11/2022   Pressure injury of skin of heel 11/04/2021   UTI (urinary tract infection) 10/22/2021   Iron deficiency anemia 10/19/2021   Hyperkalemia 10/14/2021   Acute urinary retention 10/08/2021   Sepsis due to methicillin resistant Staphylococcus aureus (MRSA) (HCC) 10/04/2021   Acute delirium 10/04/2021   Acute on chronic respiratory failure with hypoxia and hypercapnia (HCC) 10/04/2021   Acute respiratory failure with hypoxia and hypercapnia (HCC) 10/03/2021   OSA (obstructive sleep apnea) 10/03/2021   Malnutrition of moderate degree 09/29/2021   Generalized weakness 09/29/2021   Cellulitis of left foot 08/04/2021   Uncontrolled type 2 diabetes mellitus with hypoglycemia, with long-term current use of insulin (HCC) 08/04/2021  Bilateral lower extremity edema 08/04/2021   Acute osteomyelitis of lower leg, left (HCC) 08/04/2021   Swelling of limb 07/18/2021   CHF exacerbation (HCC) 05/08/2021   Diabetes mellitus without complication (HCC)    Atrial fibrillation with RVR (HCC)    Obesity, Class III, BMI 40-49.9 (morbid obesity) (HCC)    Venous stasis ulcer (HCC)    Sinus tachycardia    BPH (benign prostatic hyperplasia)    Chronic anticoagulation    Anasarca    Chronic diastolic CHF (congestive heart failure) (HCC)    Venous stasis of both lower extremities    AF (paroxysmal atrial fibrillation) (HCC)    Lower extremity cellulitis 01/03/2021   Chronic osteomyelitis of left foot (HCC) 01/03/2021   Osteomyelitis (HCC) 01/03/2021   Severe sepsis (HCC) 02/24/2020   AKI  (acute kidney injury) (HCC) 02/24/2020   Bilateral cellulitis of lower leg 02/23/2020   CAD (coronary artery disease) 02/02/2020   RLS (restless legs syndrome) 05/15/2019   Statin myopathy 05/15/2019   Type 2 diabetes mellitus with obesity (HCC) 02/13/2016   Essential hypertension 02/13/2016   PAD (peripheral artery disease) (HCC) 02/13/2016   Pressure injury of skin 01/25/2016   Hypoglycemia associated with diabetes (HCC) 12/27/2013   Long-term insulin use (HCC) 12/27/2013   Microalbuminuria 12/27/2013   B12 deficiency 09/29/2013   Obesity 09/29/2013   CAD (coronary artery disease), autologous vein bypass graft 07/20/2013   Hypersomnia with sleep apnea 07/20/2013   Pure hypercholesterolemia 07/20/2013   Osteomyelitis of ankle or foot 07/20/2011    PCP: Dr. Aram Beecham  REFERRING PROVIDER: Sheppard Plumber, NP  REFERRING DIAG: [id number] (ICD-10-CM) - Status post below-knee amputation of right lower extremity (HCC)   THERAPY DIAG:  Difficulty in walking, not elsewhere classified  Muscle weakness (generalized)  Other abnormalities of gait and mobility  Other lack of coordination  Abnormality of gait and mobility  Unsteadiness on feet  Rationale for Evaluation and Treatment: Rehabilitation  ONSET DATE: 05/24/2022 (date of surgery for latest Below knee amputation- RLE)   SUBJECTIVE:   SUBJECTIVE STATEMENT: Patient reports he thought his appointment was later- did not eat prior to PT and states legs more swollen today and unable to donn prostheses today.    PERTINENT HISTORY: Patient is a 67 year old male with CAD/PAF on Eliquis followed by Cardiology.. Obese with BMI>38. HTN stable on meds. Has HLD not on statin due to myopathy. Also with DM on insulin and thyroid dz on Synthroid. (See above section for all PMH) Patient reports he is eager to be here and a long road to recovery- Reports having his left leg amputated last year (10/03/2021) and most recently his right LE - both  below the knee amputations. Reports very dependent with all mobility and states ready to get stronger and on his feet. States just received his prosthesis yesterday.   Patient using [facility] for prosthetic care PAIN:  Are you having pain? No  PRECAUTIONS: Fall  RED FLAGS: None   WEIGHT BEARING RESTRICTIONS: No  FALLS:  Has patient fallen in last 6 months?  Will ask next visit  LIVING ENVIRONMENT: Lives with: lives with their spouse Lives in: House/apartment Stairs:  split level home - 2 step down to den- ramp for entry in home.  Has following equipment at home: Quad cane large base, Environmental consultant - 2 wheeled, Wheelchair (manual), [employer], Ramped entry, and [employer], hoyer lift  OCCUPATION: Psychologist, occupational- retired  PLOF: Independent- prior to 10/03/2021 (left BKA)   PATIENT GOALS: "I want  to walk with my prostheses"  NEXT MD VISIT: TBA  OBJECTIVE:   TODAY'S TREATMENT:                                                                                                                              DATE: 02/18/23         Attempted to don each prosthesis- Unable to get any click with some obvious swelling into each residual limb.     Dependent hoyer transfer - Supine on mat table  Quad sets- hold 5 sec x10 Bridging 2 sets of 12 reps  SAQ- GTB - 2 sets of 12 reps each LE Hip abd with GTB and 5# AW - 2 sets of 12 reps each LE Hip add with GTB and 5# AW- 2 sets of 12 reps SLR with 5# and GTB resistance - 2 sets of 12 reps Hip ext with GTB resistance 2 sets of 12 reps.   HOME EXERCISE PROGRAM: Access Code: [email or web address] URL: [email or web address] Date: 12/20/2022 Prepared by: Maureen Ralphs  Exercises - Supine Quad Set (BKA)  - 1 x daily - 3 x weekly - 3 sets - 10 reps - Sidelying Hip Abduction wtih Flexion and Extension (BKA)  - 1 x daily - 3 x weekly - 3 sets - 10 reps - Supine Single Leg Bridge with Sound Leg (BKA)  - 1 x daily - 3 x weekly - 3 sets - 10 reps -  Supine Hip Extension with Towel Roll (BKA)  - 1 x daily - 3 x weekly - 3 sets - 10 reps - Hooklying Clamshell with Resistance  - 1 x daily - 3 x weekly - 3 sets - 10 reps    -determination of a schedule at home for wearing of prosthese and shrinkers; recommended on 3x daily for prostheses with ad lib increase in time. -discussed pros and cons of shrink use/disuse overnight   ASSESSMENT:  CLINICAL IMPRESSION: Treatment limited to LE strengthening in supine only today as patient presented with increased overall LE swelling and unable to don prosthesis today. He was responsive to LE strengthening and able to perform with GTB mostly today- fatigue as only limiting factor. He will benefit from skilled PT services to improve his overall LE strength, transfer ability, and possibly able to return to walking with prosthetic training for improved mobility and quality of life.    OBJECTIVE IMPAIRMENTS: decreased activity tolerance, decreased balance, decreased endurance, difficulty walking, decreased strength, and obesity.   ACTIVITY LIMITATIONS: carrying, lifting, bending, standing, squatting, stairs, transfers, bed mobility, bathing, toileting, and dressing  PARTICIPATION LIMITATIONS: cleaning, driving, shopping, community activity, occupation, and yard work  PERSONAL FACTORS: Time since onset of injury/illness/exacerbation and 3+ comorbidities: DM, COPD, PAD  are also affecting patient's functional outcome.   REHAB POTENTIAL: Good  CLINICAL DECISION MAKING: Evolving/moderate complexity  EVALUATION COMPLEXITY: Moderate   GOALS: Goals reviewed with patient? Yes  SHORT TERM GOALS: Target date: 01/09/2023  LONG  TERM GOALS: Target date: 02/20/2023   1.  Patient will demonstrate modified independent with all bed mobility (sup<-> sit)  for optimal function at home  Baseline: EVAL - Will assess 2nd visit; 01/08/2023- patient able to roll with assist of wife Goal status: ONGOING  2.   Patient will increase FOTO score to equal to or greater than  52 to demonstrate statistically significant improvement in mobility and quality of life.  Baseline: EVAL = 41 Goal status: INITIAL   3.  Patient will demonstrate all slide board transfers with Min assist (Bed <-> chair)   Baseline: EVAL= Patient unable to transfer from chair to bed due to increased musle weakness; 01/08/2023- Patient continues to require hoyer lift for all transfers Goal status: INITIAL       PLAN:  PT FREQUENCY: 1-2x/week  PT DURATION: 12 weeks  PLANNED INTERVENTIONS: Therapeutic exercises, Therapeutic activity, Neuromuscular re-education, Balance training, Gait training, Patient/Family education, Self Care, Joint mobilization, Joint manipulation, Vestibular training, Canalith repositioning, Prosthetic training, DME instructions, Dry Needling, Electrical stimulation, Wheelchair mobility training, Spinal manipulation, Spinal mobilization, Cryotherapy, Moist heat, Compression bandaging, scar mobilization, Manual therapy, and Re-evaluation  PLAN FOR NEXT SESSION:  Continue to progress LE strengthening and therapeutic activities/prosthetic training.  Functional standing to prepare for transfers and potential walking in future.    3:05 PM, 02/18/23 Louis Meckel, PT Physical Therapist - [facility]  [facility] [phone]     Lenda Kelp, PT 02/18/2023, 3:05 PM

## 2023-02-20 ENCOUNTER — Other Ambulatory Visit: Payer: Self-pay

## 2023-02-21 ENCOUNTER — Other Ambulatory Visit: Payer: Self-pay

## 2023-02-21 ENCOUNTER — Ambulatory Visit: Payer: Medicare Other

## 2023-02-21 DIAGNOSIS — R262 Difficulty in walking, not elsewhere classified: Secondary | ICD-10-CM | POA: Diagnosis not present

## 2023-02-21 DIAGNOSIS — R269 Unspecified abnormalities of gait and mobility: Secondary | ICD-10-CM

## 2023-02-21 DIAGNOSIS — R2689 Other abnormalities of gait and mobility: Secondary | ICD-10-CM

## 2023-02-21 DIAGNOSIS — M6281 Muscle weakness (generalized): Secondary | ICD-10-CM

## 2023-02-21 DIAGNOSIS — R278 Other lack of coordination: Secondary | ICD-10-CM

## 2023-02-21 NOTE — Therapy (Signed)
OUTPATIENT PHYSICAL THERAPY TREATMENT/RECERt/Physical Therapy Progress Note   Dates of reporting period  01/17/2023   to   02/21/2023     Patient Name: [name]. MRN: [id number] DOB:1955-09-14, 67 y.o., male Today's Date: 02/22/2023  END OF SESSION:  PT End of Session - 02/21/23 1522     Visit Number 20    Number of Visits 24    Date for PT Re-Evaluation 05/16/23    Authorization Type [employer] Medicare    Progress Note Due on Visit 30    PT Start Time 1525    PT Stop Time 1614    PT Time Calculation (min) 49 min    Equipment Utilized During Treatment Gait belt    Activity Tolerance Patient tolerated treatment well;No increased pain;Patient limited by fatigue    Behavior During Therapy [facility] for tasks assessed/performed                      Past Medical History:  Diagnosis Date   Arrhythmia    atrial fibrillation   Asthma    CHF (congestive heart failure) (HCC)    COPD (chronic obstructive pulmonary disease) (HCC)    Coronary artery disease    Depression    Diabetes mellitus without complication (HCC)    Gout    History anabolic steroid use    Hyperlipidemia    Hypertension    Hypogonadism in male    MI (myocardial infarction) (HCC)    Morbid obesity (HCC)    Myocardial infarction (HCC)    Peripheral vascular disease (HCC)    Perirectal abscess    Pleurisy    Sleep apnea    CPAP at night, no oxygen   Varicella    Past Surgical History:  Procedure Laterality Date   ABDOMINAL AORTIC ANEURYSM REPAIR     ACHILLES TENDON SURGERY Left 01/10/2021   Procedure: ACHILLES LENGTHENING/KIDNER;  Surgeon: Rosetta Posner, DPM;  Location: [facility];  Service: Podiatry;  Laterality: Left;   AMPUTATION Left 10/03/2021   Procedure: AMPUTATION BELOW KNEE;  Surgeon: Renford Dills, MD;  Location: [facility];  Service: Vascular;  Laterality: Left;   AMPUTATION Right 05/24/2022   Procedure: AMPUTATION BELOW KNEE;  Surgeon: Annice Needy, MD;  Location: [facility];   Service: General;  Laterality: Right;   AMPUTATION TOE Right 02/10/2016   Procedure: AMPUTATION TOE 3RD TOE;  Surgeon: Gwyneth Revels, DPM;  Location: [facility];  Service: Podiatry;  Laterality: Right;   AMPUTATION TOE Left 02/24/2020   Procedure: AMPUTATION TOE;  Surgeon: Rosetta Posner, DPM;  Location: [facility];  Service: Podiatry;  Laterality: Left;   APPLICATION OF WOUND VAC Left 02/29/2020   Procedure: APPLICATION OF WOUND VAC;  Surgeon: Rosetta Posner, DPM;  Location: [facility];  Service: Podiatry;  Laterality: Left;   CARDIAC CATHETERIZATION     CARDIOVERSION [name] 02/13/2022   Procedure: CARDIOVERSION;  Surgeon: Lamar Blinks, MD;  Location: [facility];  Service: Cardiovascular;  Laterality: [name];   COLONOSCOPY WITH PROPOFOL [name] 11/18/2015   Procedure: COLONOSCOPY WITH PROPOFOL;  Surgeon: Scot Jun, MD;  Location: [facility];  Service: Endoscopy;  Laterality: [name];   CORONARY ARTERY BYPASS GRAFT     CORONARY STENT INTERVENTION [name] 02/02/2020   Procedure: CORONARY STENT INTERVENTION;  Surgeon: Marcina Millard, MD;  Location: [facility];  Service: Cardiovascular;  Laterality: [name];   INCISION AND DRAINAGE Left 08/07/2021   Procedure: INCISION AND DRAINAGE-Partial Calcanectomy;  Surgeon: Rosetta Posner, DPM;  Location: [facility];  Service: Podiatry;  Laterality: Left;   IRRIGATION AND DEBRIDEMENT FOOT Left 02/29/2020   Procedure: IRRIGATION AND DEBRIDEMENT FOOT;  Surgeon: Rosetta Posner, DPM;  Location: [facility];  Service: Podiatry;  Laterality: Left;   IRRIGATION AND DEBRIDEMENT FOOT Left 02/24/2020   Procedure: IRRIGATION AND DEBRIDEMENT FOOT;  Surgeon: Rosetta Posner, DPM;  Location: [facility];  Service: Podiatry;  Laterality: Left;   KNEE ARTHROSCOPY     LEFT HEART CATH AND CORS/GRAFTS ANGIOGRAPHY [name] 02/02/2020   Procedure: LEFT HEART CATH AND CORS/GRAFTS ANGIOGRAPHY;  Surgeon: Dalia Heading, MD;  Location: [facility];  Service: Cardiovascular;  Laterality:  [name];   LOWER EXTREMITY ANGIOGRAPHY Left 02/25/2020   Procedure: Lower Extremity Angiography;  Surgeon: Annice Needy, MD;  Location: [facility];  Service: Cardiovascular;  Laterality: Left;   LOWER EXTREMITY ANGIOGRAPHY Left 01/04/2021   Procedure: LOWER EXTREMITY ANGIOGRAPHY;  Surgeon: Annice Needy, MD;  Location: [facility];  Service: Cardiovascular;  Laterality: Left;   LOWER EXTREMITY ANGIOGRAPHY Right 05/21/2022   Procedure: Lower Extremity Angiography;  Surgeon: Annice Needy, MD;  Location: [facility];  Service: Cardiovascular;  Laterality: Right;   METATARSAL HEAD EXCISION Left 01/10/2021   Procedure: METATARSAL HEAD EXCISION - LEFT 5th;  Surgeon: Rosetta Posner, DPM;  Location: [facility];  Service: Podiatry;  Laterality: Left;   PERIPHERAL VASCULAR CATHETERIZATION Right 01/24/2016   Procedure: Lower Extremity Angiography;  Surgeon: Renford Dills, MD;  Location: [facility];  Service: Cardiovascular;  Laterality: Right;   PERIPHERAL VASCULAR CATHETERIZATION Right 01/25/2016   Procedure: Lower Extremity Angiography;  Surgeon: Renford Dills, MD;  Location: [facility];  Service: Cardiovascular;  Laterality: Right;   TOE AMPUTATION     TONSILLECTOMY     Patient Active Problem List   Diagnosis Date Noted   Status post below-knee amputation of right lower extremity (HCC) 05/31/2022   S/P amputation 05/30/2022   Poorly controlled diabetes mellitus (HCC) 05/30/2022   Acute osteomyelitis of right foot (HCC) 05/19/2022   Wound infection 05/18/2022   Ulcer of right ankle, with fat layer exposed (HCC) 05/18/2022   Non-healing wound of lower extremity, initial encounter 05/18/2022   CAD with Hx of CABG 05/18/2022   Chronic respiratory failure with hypoxia (HCC) 05/18/2022   S/P BKA (below knee amputation), left (HCC) 05/18/2022   COPD (chronic obstructive pulmonary disease) (HCC) 05/18/2022   Perinephric hematoma 03/20/2022   Bacteremia due  to Klebsiella pneumoniae 03/19/2022   Hyperglycemia 03/16/2022   Septic shock (HCC) 03/16/2022   Sepsis secondary to UTI (HCC) 03/15/2022   Acute hypoxemic respiratory failure (HCC) 02/13/2022   Atrial fibrillation with rapid ventricular response (HCC) 02/11/2022   Uncontrolled type 2 diabetes mellitus with hyperglycemia, with long-term current use of insulin (HCC) 02/11/2022   Dyslipidemia 02/11/2022   Hypothyroidism 02/11/2022   Benign prostatic hyperplasia with lower urinary tract symptoms 02/11/2022   Pressure injury of skin of heel 11/04/2021   UTI (urinary tract infection) 10/22/2021   Iron deficiency anemia 10/19/2021   Hyperkalemia 10/14/2021   Acute urinary retention 10/08/2021   Sepsis due to methicillin resistant Staphylococcus aureus (MRSA) (HCC) 10/04/2021   Acute delirium 10/04/2021   Acute on chronic respiratory failure with hypoxia and hypercapnia (HCC) 10/04/2021   Acute respiratory failure with hypoxia and hypercapnia (HCC) 10/03/2021   OSA (obstructive sleep apnea) 10/03/2021   Malnutrition of moderate degree 09/29/2021   Generalized weakness 09/29/2021   Cellulitis of left foot 08/04/2021  Uncontrolled type 2 diabetes mellitus with hypoglycemia, with long-term current use of insulin (HCC) 08/04/2021   Bilateral lower extremity edema 08/04/2021   Acute osteomyelitis of lower leg, left (HCC) 08/04/2021   Swelling of limb 07/18/2021   CHF exacerbation (HCC) 05/08/2021   Diabetes mellitus without complication (HCC)    Atrial fibrillation with RVR (HCC)    Obesity, Class III, BMI 40-49.9 (morbid obesity) (HCC)    Venous stasis ulcer (HCC)    Sinus tachycardia    BPH (benign prostatic hyperplasia)    Chronic anticoagulation    Anasarca    Chronic diastolic CHF (congestive heart failure) (HCC)    Venous stasis of both lower extremities    AF (paroxysmal atrial fibrillation) (HCC)    Lower extremity cellulitis 01/03/2021   Chronic osteomyelitis of left foot (HCC)  01/03/2021   Osteomyelitis (HCC) 01/03/2021   Severe sepsis (HCC) 02/24/2020   AKI (acute kidney injury) (HCC) 02/24/2020   Bilateral cellulitis of lower leg 02/23/2020   CAD (coronary artery disease) 02/02/2020   RLS (restless legs syndrome) 05/15/2019   Statin myopathy 05/15/2019   Type 2 diabetes mellitus with obesity (HCC) 02/13/2016   Essential hypertension 02/13/2016   PAD (peripheral artery disease) (HCC) 02/13/2016   Pressure injury of skin 01/25/2016   Hypoglycemia associated with diabetes (HCC) 12/27/2013   Long-term insulin use (HCC) 12/27/2013   Microalbuminuria 12/27/2013   B12 deficiency 09/29/2013   Obesity 09/29/2013   CAD (coronary artery disease), autologous vein bypass graft 07/20/2013   Hypersomnia with sleep apnea 07/20/2013   Pure hypercholesterolemia 07/20/2013   Osteomyelitis of ankle or foot 07/20/2011    PCP: Dr. Aram Beecham  REFERRING PROVIDER: Sheppard Plumber, NP  REFERRING DIAG: [phone] (ICD-10-CM) - Status post below-knee amputation of right lower extremity (HCC)   THERAPY DIAG:  Difficulty in walking, not elsewhere classified  Muscle weakness (generalized)  Other abnormalities of gait and mobility  Other lack of coordination  Abnormality of gait and mobility  Rationale for Evaluation and Treatment: Rehabilitation  ONSET DATE: 05/24/2022 (date of surgery for latest Below knee amputation- RLE)   SUBJECTIVE:   SUBJECTIVE STATEMENT: Patient reports doing okay today- using 2 shrinkers since yesterday to make sure his legs would fit. He states no pain today.     PERTINENT HISTORY: Patient is a 67 year old male with CAD/PAF on Eliquis followed by Cardiology.. Obese with BMI>38. HTN stable on meds. Has HLD not on statin due to myopathy. Also with DM on insulin and thyroid dz on Synthroid. (See above section for all PMH) Patient reports he is eager to be here and a long road to recovery- Reports having his left leg amputated last year (10/03/2021)  and most recently his right LE - both below the knee amputations. Reports very dependent with all mobility and states ready to get stronger and on his feet. States just received his prosthesis yesterday.   Patient using [facility] for prosthetic care PAIN:  Are you having pain? No  PRECAUTIONS: Fall  RED FLAGS: None   WEIGHT BEARING RESTRICTIONS: No  FALLS:  Has patient fallen in last 6 months?  Will ask next visit  LIVING ENVIRONMENT: Lives with: lives with their spouse Lives in: House/apartment Stairs:  split level home - 2 step down to den- ramp for entry in home.  Has following equipment at home: Quad cane large base, Environmental consultant - 2 wheeled, Wheelchair (manual), [employer], Ramped entry, and [employer], hoyer lift  OCCUPATION: Psychologist, occupational- retired  PLOF: Independent- prior  to 10/03/2021 (left BKA)   PATIENT GOALS: "I want to walk with my prostheses"  NEXT MD VISIT: TBA  OBJECTIVE:   TODAY'S TREATMENT:                                                                                                                              DATE: 02/21/2023         Therapeutic Activities:    Dependent hoyer transfer - Sitting at edge of mat table  Sit to stand with +2 person assist (multiple trials- initially Max A+2 down to Min +2 assist)   Static standing trials x 5.  Focused on standing as erect as possible- 1st 3 trials attempted to static stand as long as possible between 1-2 min. Trial 4- dynamic lateral weight shifting and trial 5 - attempted to move one leg forward then right back x 2 times each LE (minimal movement today.)  Therapeutic Exercises:  LAQ 2 x 10 each LE Hip flex 2 x 10 reps LE   HOME EXERCISE PROGRAM: Access Code: [email or web address] URL: [email or web address] Date: 12/20/2022 Prepared by: Maureen Ralphs  Exercises - Supine Quad Set (BKA)  - 1 x daily - 3 x weekly - 3 sets - 10 reps - Sidelying Hip Abduction wtih Flexion and Extension (BKA)  - 1 x daily  - 3 x weekly - 3 sets - 10 reps - Supine Single Leg Bridge with Sound Leg (BKA)  - 1 x daily - 3 x weekly - 3 sets - 10 reps - Supine Hip Extension with Towel Roll (BKA)  - 1 x daily - 3 x weekly - 3 sets - 10 reps - Hooklying Clamshell with Resistance  - 1 x daily - 3 x weekly - 3 sets - 10 reps    -determination of a schedule at home for wearing of prosthese and shrinkers; recommended on 3x daily for prostheses with ad lib increase in time. -discussed pros and cons of shrink use/disuse overnight   ASSESSMENT:  CLINICAL IMPRESSION: Patient exhibited good motivation and effort during entire session today.  Patient continues to make good progress including less overall physical assistance needed with standing and much improved posture and standing ability during session. Added new goals to reflect patient progress and may add walking goal in near future based on his progress with standing. Patient's condition has the potential to improve in response to therapy. Maximum improvement is yet to be obtained. The anticipated improvement is attainable and reasonable in a generally predictable time.  He will benefit from skilled PT services to improve his overall LE strength, transfer ability, and possibly able to return to walking with prosthetic training for improved mobility and quality of life.    OBJECTIVE IMPAIRMENTS: decreased activity tolerance, decreased balance, decreased endurance, difficulty walking, decreased strength, and obesity.   ACTIVITY LIMITATIONS: carrying, lifting, bending, standing, squatting, stairs, transfers, bed mobility, bathing, toileting, and dressing  PARTICIPATION LIMITATIONS: cleaning, driving, shopping, community activity,  occupation, and yard work  PERSONAL FACTORS: Time since onset of injury/illness/exacerbation and 3+ comorbidities: DM, COPD, PAD  are also affecting patient's functional outcome.   REHAB POTENTIAL: Good  CLINICAL DECISION MAKING:  Evolving/moderate complexity  EVALUATION COMPLEXITY: Moderate   GOALS: Goals reviewed with patient? Yes  SHORT TERM GOALS: Target date: 01/09/2023    1. Pt will be independent with HEP in order to improve strength and balance in order to decrease fall risk and improve function at home    Baseline: No formal HEP in place;  02/21/2023-Patient verbalized good understanding of HEP and practicing some with prosthesis donned and some with them off.    Goal status: ONGOING   LONG TERM GOALS: Target date: 02/20/2023   1.  Patient will demonstrate modified independent with all bed mobility (sup<-> sit)  for optimal function at home  Baseline: EVAL - Will assess 2nd visit; 01/08/2023- patient able to roll with assist of wife;  Goal status: MET  2.  Patient will increase FOTO score to equal to or greater than  52 to demonstrate statistically significant improvement in mobility and quality of life.  Baseline: EVAL = 41 Goal status: INITIAL   3.  Patient will demonstrate all slide board transfers with Min assist (Bed <-> chair)   Baseline: EVAL= Patient unable to transfer from chair to bed due to increased musle weakness; 01/08/2023- Patient continues to require hoyer lift for all transfers; 02/21/2023- no changes since 9/10 with continued use of both hoyer and slide board transfers at home. Goal status: ONGOING  4. Patient and wife will be independent with donning prostheses consistently able to get 2-3 clicks each LE for improved ability to don his legs to prepare for transfers/gait.    Baseline: 02/21/2023- Patient and wife are not able to consistently don prostheses - experiencing some difficulty at times.   Goal Status: New  5. Patient will demonstrate ability to stand > using walker and CGA only  for improved functional mobility and to prepare for pregait/and prosthetic gait training.     Baseline: 02/21/2023- Patient able to stand yet very inconsistent with times and amount of  assist - min assist +2 to max    Assist.    Goal status: NEW    PLAN:  PT FREQUENCY: 1-2x/week  PT DURATION: 12 weeks  PLANNED INTERVENTIONS: Therapeutic exercises, Therapeutic activity, Neuromuscular re-education, Balance training, Gait training, Patient/Family education, Self Care, Joint mobilization, Joint manipulation, Vestibular training, Canalith repositioning, Prosthetic training, DME instructions, Dry Needling, Electrical stimulation, Wheelchair mobility training, Spinal manipulation, Spinal mobilization, Cryotherapy, Moist heat, Compression bandaging, scar mobilization, Manual therapy, and Re-evaluation  PLAN FOR NEXT SESSION:  Continue to progress LE strengthening and therapeutic activities/prosthetic training.  Functional standing to prepare for transfers and potential walking in future.    4:47 PM, 02/22/23 Louis Meckel, PT Physical Therapist - [facility]  [facility] [phone]     Lenda Kelp, PT 02/22/2023, 4:47 PM

## 2023-02-22 ENCOUNTER — Other Ambulatory Visit: Payer: Self-pay

## 2023-02-25 ENCOUNTER — Ambulatory Visit: Payer: Medicare Other

## 2023-02-25 DIAGNOSIS — R262 Difficulty in walking, not elsewhere classified: Secondary | ICD-10-CM

## 2023-02-25 DIAGNOSIS — R2681 Unsteadiness on feet: Secondary | ICD-10-CM

## 2023-02-25 DIAGNOSIS — R278 Other lack of coordination: Secondary | ICD-10-CM

## 2023-02-25 DIAGNOSIS — M6281 Muscle weakness (generalized): Secondary | ICD-10-CM

## 2023-02-25 DIAGNOSIS — R269 Unspecified abnormalities of gait and mobility: Secondary | ICD-10-CM

## 2023-02-25 DIAGNOSIS — R2689 Other abnormalities of gait and mobility: Secondary | ICD-10-CM

## 2023-02-26 NOTE — Therapy (Signed)
OUTPATIENT PHYSICAL THERAPY TREATMENT    Patient Name: [name]. MRN: [id number] DOB:July 05, 1955, 67 y.o., male Today's Date: 02/26/2023  END OF SESSION:  PT End of Session - 02/25/23 1451     Visit Number 21    Number of Visits 44    Date for PT Re-Evaluation 05/16/23    Authorization Type [employer]BS Medicare    Progress Note Due on Visit 30    PT Start Time 1446    PT Stop Time 1526    PT Time Calculation (min) 40 min    Equipment Utilized During Treatment Gait belt    Activity Tolerance Patient tolerated treatment well;No increased pain;Patient limited by fatigue    Behavior During Therapy [facility] for tasks assessed/performed                      Past Medical History:  Diagnosis Date   Arrhythmia    atrial fibrillation   Asthma    CHF (congestive heart failure) (HCC)    COPD (chronic obstructive pulmonary disease) (HCC)    Coronary artery disease    Depression    Diabetes mellitus without complication (HCC)    Gout    History anabolic steroid use    Hyperlipidemia    Hypertension    Hypogonadism in male    MI (myocardial infarction) (HCC)    Morbid obesity (HCC)    Myocardial infarction (HCC)    Peripheral vascular disease (HCC)    Perirectal abscess    Pleurisy    Sleep apnea    CPAP at night, no oxygen   Varicella    Past Surgical History:  Procedure Laterality Date   ABDOMINAL AORTIC ANEURYSM REPAIR     ACHILLES TENDON SURGERY Left 01/10/2021   Procedure: ACHILLES LENGTHENING/KIDNER;  Surgeon: Rosetta Posner, DPM;  Location: [facility];  Service: Podiatry;  Laterality: Left;   AMPUTATION Left 10/03/2021   Procedure: AMPUTATION BELOW KNEE;  Surgeon: Renford Dills, MD;  Location: [facility];  Service: Vascular;  Laterality: Left;   AMPUTATION Right 05/24/2022   Procedure: AMPUTATION BELOW KNEE;  Surgeon: Annice Needy, MD;  Location: [facility];  Service: General;  Laterality: Right;   AMPUTATION TOE Right 02/10/2016   Procedure: AMPUTATION  TOE 3RD TOE;  Surgeon: Gwyneth Revels, DPM;  Location: [facility];  Service: Podiatry;  Laterality: Right;   AMPUTATION TOE Left 02/24/2020   Procedure: AMPUTATION TOE;  Surgeon: Rosetta Posner, DPM;  Location: [facility];  Service: Podiatry;  Laterality: Left;   APPLICATION OF WOUND VAC Left 02/29/2020   Procedure: APPLICATION OF WOUND VAC;  Surgeon: Rosetta Posner, DPM;  Location: [facility];  Service: Podiatry;  Laterality: Left;   CARDIAC CATHETERIZATION     CARDIOVERSION [name] 02/13/2022   Procedure: CARDIOVERSION;  Surgeon: Lamar Blinks, MD;  Location: [facility];  Service: Cardiovascular;  Laterality: [name];   COLONOSCOPY WITH PROPOFOL [name] 11/18/2015   Procedure: COLONOSCOPY WITH PROPOFOL;  Surgeon: Scot Jun, MD;  Location: [facility];  Service: Endoscopy;  Laterality: [name];   CORONARY ARTERY BYPASS GRAFT     CORONARY STENT INTERVENTION [name] 02/02/2020   Procedure: CORONARY STENT INTERVENTION;  Surgeon: Marcina Millard, MD;  Location: [facility];  Service: Cardiovascular;  Laterality: [name];   INCISION AND DRAINAGE Left 08/07/2021   Procedure: INCISION AND DRAINAGE-Partial Calcanectomy;  Surgeon: Rosetta Posner, DPM;  Location: [facility];  Service: Podiatry;  Laterality: Left;   IRRIGATION AND DEBRIDEMENT FOOT Left 02/29/2020   Procedure: IRRIGATION  AND DEBRIDEMENT FOOT;  Surgeon: Rosetta Posner, DPM;  Location: [facility];  Service: Podiatry;  Laterality: Left;   IRRIGATION AND DEBRIDEMENT FOOT Left 02/24/2020   Procedure: IRRIGATION AND DEBRIDEMENT FOOT;  Surgeon: Rosetta Posner, DPM;  Location: [facility];  Service: Podiatry;  Laterality: Left;   KNEE ARTHROSCOPY     LEFT HEART CATH AND CORS/GRAFTS ANGIOGRAPHY [name] 02/02/2020   Procedure: LEFT HEART CATH AND CORS/GRAFTS ANGIOGRAPHY;  Surgeon: Dalia Heading, MD;  Location: [facility];  Service: Cardiovascular;  Laterality: [name];   LOWER EXTREMITY ANGIOGRAPHY Left 02/25/2020   Procedure: Lower Extremity Angiography;   Surgeon: Annice Needy, MD;  Location: [facility];  Service: Cardiovascular;  Laterality: Left;   LOWER EXTREMITY ANGIOGRAPHY Left 01/04/2021   Procedure: LOWER EXTREMITY ANGIOGRAPHY;  Surgeon: Annice Needy, MD;  Location: [facility];  Service: Cardiovascular;  Laterality: Left;   LOWER EXTREMITY ANGIOGRAPHY Right 05/21/2022   Procedure: Lower Extremity Angiography;  Surgeon: Annice Needy, MD;  Location: [facility];  Service: Cardiovascular;  Laterality: Right;   METATARSAL HEAD EXCISION Left 01/10/2021   Procedure: METATARSAL HEAD EXCISION - LEFT 5th;  Surgeon: Rosetta Posner, DPM;  Location: [facility];  Service: Podiatry;  Laterality: Left;   PERIPHERAL VASCULAR CATHETERIZATION Right 01/24/2016   Procedure: Lower Extremity Angiography;  Surgeon: Renford Dills, MD;  Location: [facility];  Service: Cardiovascular;  Laterality: Right;   PERIPHERAL VASCULAR CATHETERIZATION Right 01/25/2016   Procedure: Lower Extremity Angiography;  Surgeon: Renford Dills, MD;  Location: [facility];  Service: Cardiovascular;  Laterality: Right;   TOE AMPUTATION     TONSILLECTOMY     Patient Active Problem List   Diagnosis Date Noted   Status post below-knee amputation of right lower extremity (HCC) 05/31/2022   S/P amputation 05/30/2022   Poorly controlled diabetes mellitus (HCC) 05/30/2022   Acute osteomyelitis of right foot (HCC) 05/19/2022   Wound infection 05/18/2022   Ulcer of right ankle, with fat layer exposed (HCC) 05/18/2022   Non-healing wound of lower extremity, initial encounter 05/18/2022   CAD with Hx of CABG 05/18/2022   Chronic respiratory failure with hypoxia (HCC) 05/18/2022   S/P BKA (below knee amputation), left (HCC) 05/18/2022   COPD (chronic obstructive pulmonary disease) (HCC) 05/18/2022   Perinephric hematoma 03/20/2022   Bacteremia due to Klebsiella pneumoniae 03/19/2022   Hyperglycemia 03/16/2022   Septic shock (HCC)  03/16/2022   Sepsis secondary to UTI (HCC) 03/15/2022   Acute hypoxemic respiratory failure (HCC) 02/13/2022   Atrial fibrillation with rapid ventricular response (HCC) 02/11/2022   Uncontrolled type 2 diabetes mellitus with hyperglycemia, with long-term current use of insulin (HCC) 02/11/2022   Dyslipidemia 02/11/2022   Hypothyroidism 02/11/2022   Benign prostatic hyperplasia with lower urinary tract symptoms 02/11/2022   Pressure injury of skin of heel 11/04/2021   UTI (urinary tract infection) 10/22/2021   Iron deficiency anemia 10/19/2021   Hyperkalemia 10/14/2021   Acute urinary retention 10/08/2021   Sepsis due to methicillin resistant Staphylococcus aureus (MRSA) (HCC) 10/04/2021   Acute delirium 10/04/2021   Acute on chronic respiratory failure with hypoxia and hypercapnia (HCC) 10/04/2021   Acute respiratory failure with hypoxia and hypercapnia (HCC) 10/03/2021   OSA (obstructive sleep apnea) 10/03/2021   Malnutrition of moderate degree 09/29/2021   Generalized weakness 09/29/2021   Cellulitis of left foot 08/04/2021   Uncontrolled type 2 diabetes mellitus with hypoglycemia, with long-term current use of insulin (HCC) 08/04/2021  Bilateral lower extremity edema 08/04/2021   Acute osteomyelitis of lower leg, left (HCC) 08/04/2021   Swelling of limb 07/18/2021   CHF exacerbation (HCC) 05/08/2021   Diabetes mellitus without complication (HCC)    Atrial fibrillation with RVR (HCC)    Obesity, Class III, BMI 40-49.9 (morbid obesity) (HCC)    Venous stasis ulcer (HCC)    Sinus tachycardia    BPH (benign prostatic hyperplasia)    Chronic anticoagulation    Anasarca    Chronic diastolic CHF (congestive heart failure) (HCC)    Venous stasis of both lower extremities    AF (paroxysmal atrial fibrillation) (HCC)    Lower extremity cellulitis 01/03/2021   Chronic osteomyelitis of left foot (HCC) 01/03/2021   Osteomyelitis (HCC) 01/03/2021   Severe sepsis (HCC) 02/24/2020   AKI  (acute kidney injury) (HCC) 02/24/2020   Bilateral cellulitis of lower leg 02/23/2020   CAD (coronary artery disease) 02/02/2020   RLS (restless legs syndrome) 05/15/2019   Statin myopathy 05/15/2019   Type 2 diabetes mellitus with obesity (HCC) 02/13/2016   Essential hypertension 02/13/2016   PAD (peripheral artery disease) (HCC) 02/13/2016   Pressure injury of skin 01/25/2016   Hypoglycemia associated with diabetes (HCC) 12/27/2013   Long-term insulin use (HCC) 12/27/2013   Microalbuminuria 12/27/2013   B12 deficiency 09/29/2013   Obesity 09/29/2013   CAD (coronary artery disease), autologous vein bypass graft 07/20/2013   Hypersomnia with sleep apnea 07/20/2013   Pure hypercholesterolemia 07/20/2013   Osteomyelitis of ankle or foot 07/20/2011    PCP: Dr. Aram Beecham  REFERRING PROVIDER: Sheppard Plumber, NP  REFERRING DIAG: [id number] (ICD-10-CM) - Status post below-knee amputation of right lower extremity (HCC)   THERAPY DIAG:  Difficulty in walking, not elsewhere classified  Muscle weakness (generalized)  Other abnormalities of gait and mobility  Other lack of coordination  Abnormality of gait and mobility  Unsteadiness on feet  Rationale for Evaluation and Treatment: Rehabilitation  ONSET DATE: 05/24/2022 (date of surgery for latest Below knee amputation- RLE)   SUBJECTIVE:   SUBJECTIVE STATEMENT: Patient reports he is doing okay - was able to get his legs on really well.    PERTINENT HISTORY: Patient is a 67 year old male with CAD/PAF on Eliquis followed by Cardiology.. Obese with BMI>38. HTN stable on meds. Has HLD not on statin due to myopathy. Also with DM on insulin and thyroid dz on Synthroid. (See above section for all PMH) Patient reports he is eager to be here and a long road to recovery- Reports having his left leg amputated last year (10/03/2021) and most recently his right LE - both below the knee amputations. Reports very dependent with all mobility and  states ready to get stronger and on his feet. States just received his prosthesis yesterday.   Patient using [facility] for prosthetic care PAIN:  Are you having pain? No  PRECAUTIONS: Fall  RED FLAGS: None   WEIGHT BEARING RESTRICTIONS: No  FALLS:  Has patient fallen in last 6 months?  Will ask next visit  LIVING ENVIRONMENT: Lives with: lives with their spouse Lives in: House/apartment Stairs:  split level home - 2 step down to den- ramp for entry in home.  Has following equipment at home: Chiropractor, Environmental consultant - 2 wheeled, Wheelchair (manual), [employer], Ramped entry, and [employer], hoyer lift  OCCUPATION: Psychologist, occupational- retired  PLOF: Independent- prior to 10/03/2021 (left BKA)   PATIENT GOALS: "I want to walk with my prostheses"  NEXT MD VISIT: TBA  OBJECTIVE:   TODAY'S TREATMENT:                                                                                                                              DATE: 02/25/2023         Therapeutic Activities:    *All activities performed in // bars   Sit to stand with +2 person assist  (max assist) 1 in front and 1 in behind- x multiple stands with patient pulling up using // bars.  Static standing trials x 5.  Focused on standing as erect as possible- Patient with difficulty extending knees and gluteals. He was able to stand at most 2 min with assist today.   Therapeutic Exercises:  LAQ 2 x 10 each LE Hip flex 2 x 10 reps LE   HOME EXERCISE PROGRAM: Access Code: [email or web address] URL: [email or web address] Date: 12/20/2022 Prepared by: Maureen Ralphs  Exercises - Supine Quad Set (BKA)  - 1 x daily - 3 x weekly - 3 sets - 10 reps - Sidelying Hip Abduction wtih Flexion and Extension (BKA)  - 1 x daily - 3 x weekly - 3 sets - 10 reps - Supine Single Leg Bridge with Sound Leg (BKA)  - 1 x daily - 3 x weekly - 3 sets - 10 reps - Supine Hip Extension with Towel Roll (BKA)  - 1 x daily - 3 x weekly - 3  sets - 10 reps - Hooklying Clamshell with Resistance  - 1 x daily - 3 x weekly - 3 sets - 10 reps    -determination of a schedule at home for wearing of prosthese and shrinkers; recommended on 3x daily for prostheses with ad lib increase in time. -discussed pros and cons of shrink use/disuse overnight   ASSESSMENT:  CLINICAL IMPRESSION: Patient presents with progression in standing. He was able to stand in // bars for 1st time today and performed well. He was appropriately weak and struggled with knee control but worked hard to maintain standing placing as much weight as possible through his legs.  He will benefit from skilled PT services to improve his overall LE strength, transfer ability, and possibly able to return to walking with prosthetic training for improved mobility and quality of life.    OBJECTIVE IMPAIRMENTS: decreased activity tolerance, decreased balance, decreased endurance, difficulty walking, decreased strength, and obesity.   ACTIVITY LIMITATIONS: carrying, lifting, bending, standing, squatting, stairs, transfers, bed mobility, bathing, toileting, and dressing  PARTICIPATION LIMITATIONS: cleaning, driving, shopping, community activity, occupation, and yard work  PERSONAL FACTORS: Time since onset of injury/illness/exacerbation and 3+ comorbidities: DM, COPD, PAD  are also affecting patient's functional outcome.   REHAB POTENTIAL: Good  CLINICAL DECISION MAKING: Evolving/moderate complexity  EVALUATION COMPLEXITY: Moderate   GOALS: Goals reviewed with patient? Yes  SHORT TERM GOALS: Target date: 01/09/2023    1. Pt will be independent with HEP in order to improve strength and balance in order to decrease  fall risk and improve function at home    Baseline: No formal HEP in place;  02/21/2023-Patient verbalized good understanding of HEP and practicing some with prosthesis donned and some with them off.    Goal status: ONGOING   LONG TERM GOALS: Target date:  02/20/2023   1.  Patient will demonstrate modified independent with all bed mobility (sup<-> sit)  for optimal function at home  Baseline: EVAL - Will assess 2nd visit; 01/08/2023- patient able to roll with assist of wife;  Goal status: MET  2.  Patient will increase FOTO score to equal to or greater than  52 to demonstrate statistically significant improvement in mobility and quality of life.  Baseline: EVAL = 41 Goal status: INITIAL   3.  Patient will demonstrate all slide board transfers with Min assist (Bed <-> chair)   Baseline: EVAL= Patient unable to transfer from chair to bed due to increased musle weakness; 01/08/2023- Patient continues to require hoyer lift for all transfers; 02/21/2023- no changes since 9/10 with continued use of both hoyer and slide board transfers at home. Goal status: ONGOING  4. Patient and wife will be independent with donning prostheses consistently able to get 2-3 clicks each LE for improved ability to don his legs to prepare for transfers/gait.    Baseline: 02/21/2023- Patient and wife are not able to consistently don prostheses - experiencing some difficulty at times.   Goal Status: New  5. Patient will demonstrate ability to stand > using walker and CGA only  for improved functional mobility and to prepare for pregait/and prosthetic gait training.     Baseline: 02/21/2023- Patient able to stand yet very inconsistent with times and amount of assist - min assist +2 to max    Assist.    Goal status: NEW    PLAN:  PT FREQUENCY: 1-2x/week  PT DURATION: 12 weeks  PLANNED INTERVENTIONS: Therapeutic exercises, Therapeutic activity, Neuromuscular re-education, Balance training, Gait training, Patient/Family education, Self Care, Joint mobilization, Joint manipulation, Vestibular training, Canalith repositioning, Prosthetic training, DME instructions, Dry Needling, Electrical stimulation, Wheelchair mobility training, Spinal manipulation, Spinal  mobilization, Cryotherapy, Moist heat, Compression bandaging, scar mobilization, Manual therapy, and Re-evaluation  PLAN FOR NEXT SESSION:  Continue to progress LE strengthening and therapeutic activities/prosthetic training.  Functional standing to prepare for transfers and potential walking in future.    5:28 PM, 02/26/23 Louis Meckel, PT Physical Therapist - [facility]  [facility] [phone]     Lenda Kelp, PT 02/26/2023, 5:28 PM

## 2023-02-27 ENCOUNTER — Ambulatory Visit: Payer: Medicare Other

## 2023-02-27 DIAGNOSIS — R2681 Unsteadiness on feet: Secondary | ICD-10-CM

## 2023-02-27 DIAGNOSIS — R262 Difficulty in walking, not elsewhere classified: Secondary | ICD-10-CM | POA: Diagnosis not present

## 2023-02-27 DIAGNOSIS — M6281 Muscle weakness (generalized): Secondary | ICD-10-CM

## 2023-02-27 DIAGNOSIS — R278 Other lack of coordination: Secondary | ICD-10-CM

## 2023-02-27 DIAGNOSIS — R269 Unspecified abnormalities of gait and mobility: Secondary | ICD-10-CM

## 2023-02-27 DIAGNOSIS — R2689 Other abnormalities of gait and mobility: Secondary | ICD-10-CM

## 2023-02-27 NOTE — Therapy (Unsigned)
OUTPATIENT PHYSICAL THERAPY TREATMENT    Patient Name: [name]. MRN: [id number] DOB:Sep 10, 1955, 67 y.o., male Today's Date: 02/28/2023  END OF SESSION:  PT End of Session - 02/27/23 1637     Visit Number 22    Number of Visits 44    Date for PT Re-Evaluation 05/16/23    Authorization Type [employer] Medicare    Progress Note Due on Visit 30    PT Start Time 1447    PT Stop Time 1529    PT Time Calculation (min) 42 min    Equipment Utilized During Treatment Gait belt    Activity Tolerance Patient tolerated treatment well;No increased pain;Patient limited by fatigue    Behavior During Therapy [facility] for tasks assessed/performed                      Past Medical History:  Diagnosis Date   Arrhythmia    atrial fibrillation   Asthma    CHF (congestive heart failure) (HCC)    COPD (chronic obstructive pulmonary disease) (HCC)    Coronary artery disease    Depression    Diabetes mellitus without complication (HCC)    Gout    History anabolic steroid use    Hyperlipidemia    Hypertension    Hypogonadism in male    MI (myocardial infarction) (HCC)    Morbid obesity (HCC)    Myocardial infarction (HCC)    Peripheral vascular disease (HCC)    Perirectal abscess    Pleurisy    Sleep apnea    CPAP at night, no oxygen   Varicella    Past Surgical History:  Procedure Laterality Date   ABDOMINAL AORTIC ANEURYSM REPAIR     ACHILLES TENDON SURGERY Left 01/10/2021   Procedure: ACHILLES LENGTHENING/KIDNER;  Surgeon: Rosetta Posner, DPM;  Location: [facility];  Service: Podiatry;  Laterality: Left;   AMPUTATION Left 10/03/2021   Procedure: AMPUTATION BELOW KNEE;  Surgeon: Renford Dills, MD;  Location: [facility];  Service: Vascular;  Laterality: Left;   AMPUTATION Right 05/24/2022   Procedure: AMPUTATION BELOW KNEE;  Surgeon: Annice Needy, MD;  Location: [facility];  Service: General;  Laterality: Right;   AMPUTATION TOE Right 02/10/2016   Procedure: AMPUTATION  TOE 3RD TOE;  Surgeon: Gwyneth Revels, DPM;  Location: [facility];  Service: Podiatry;  Laterality: Right;   AMPUTATION TOE Left 02/24/2020   Procedure: AMPUTATION TOE;  Surgeon: Rosetta Posner, DPM;  Location: [facility];  Service: Podiatry;  Laterality: Left;   APPLICATION OF WOUND VAC Left 02/29/2020   Procedure: APPLICATION OF WOUND VAC;  Surgeon: Rosetta Posner, DPM;  Location: [facility];  Service: Podiatry;  Laterality: Left;   CARDIAC CATHETERIZATION     CARDIOVERSION [name] 02/13/2022   Procedure: CARDIOVERSION;  Surgeon: Lamar Blinks, MD;  Location: [facility];  Service: Cardiovascular;  Laterality: [name];   COLONOSCOPY WITH PROPOFOL [name] 11/18/2015   Procedure: COLONOSCOPY WITH PROPOFOL;  Surgeon: Scot Jun, MD;  Location[facility];  Service: Endoscopy;  Laterality: [name];   CORONARY ARTERY BYPASS GRAFT     CORONARY STENT INTERVENTION [name] 02/02/2020   Procedure: CORONARY STENT INTERVENTION;  Surgeon: Marcina Millard, MD;  Location: [facility];  Service: Cardiovascular;  Laterality: [name];   INCISION AND DRAINAGE Left 08/07/2021   Procedure: INCISION AND DRAINAGE-Partial Calcanectomy;  Surgeon: Rosetta Posner, DPM;  Location: [facility];  Service: Podiatry;  Laterality: Left;   IRRIGATION AND DEBRIDEMENT FOOT Left 02/29/2020   Procedure: IRRIGATION  AND DEBRIDEMENT FOOT;  Surgeon: Rosetta Posner, DPM;  Location: [facility];  Service: Podiatry;  Laterality: Left;   IRRIGATION AND DEBRIDEMENT FOOT Left 02/24/2020   Procedure: IRRIGATION AND DEBRIDEMENT FOOT;  Surgeon: Rosetta Posner, DPM;  Location: [facility];  Service: Podiatry;  Laterality: Left;   KNEE ARTHROSCOPY     LEFT HEART CATH AND CORS/GRAFTS ANGIOGRAPHY [name] 02/02/2020   Procedure: LEFT HEART CATH AND CORS/GRAFTS ANGIOGRAPHY;  Surgeon: Dalia Heading, MD;  Location: [facility];  Service: Cardiovascular;  Laterality: [name];   LOWER EXTREMITY ANGIOGRAPHY Left 02/25/2020   Procedure: Lower Extremity Angiography;   Surgeon: Annice Needy, MD;  Location: [facility];  Service: Cardiovascular;  Laterality: Left;   LOWER EXTREMITY ANGIOGRAPHY Left 01/04/2021   Procedure: LOWER EXTREMITY ANGIOGRAPHY;  Surgeon: Annice Needy, MD;  Location: [facility];  Service: Cardiovascular;  Laterality: Left;   LOWER EXTREMITY ANGIOGRAPHY Right 05/21/2022   Procedure: Lower Extremity Angiography;  Surgeon: Annice Needy, MD;  Location: [facility];  Service: Cardiovascular;  Laterality: Right;   METATARSAL HEAD EXCISION Left 01/10/2021   Procedure: METATARSAL HEAD EXCISION - LEFT 5th;  Surgeon: Rosetta Posner, DPM;  Location: [facility];  Service: Podiatry;  Laterality: Left;   PERIPHERAL VASCULAR CATHETERIZATION Right 01/24/2016   Procedure: Lower Extremity Angiography;  Surgeon: Renford Dills, MD;  Location: [facility];  Service: Cardiovascular;  Laterality: Right;   PERIPHERAL VASCULAR CATHETERIZATION Right 01/25/2016   Procedure: Lower Extremity Angiography;  Surgeon: Renford Dills, MD;  Location: [facility];  Service: Cardiovascular;  Laterality: Right;   TOE AMPUTATION     TONSILLECTOMY     Patient Active Problem List   Diagnosis Date Noted   Status post below-knee amputation of right lower extremity (HCC) 05/31/2022   S/P amputation 05/30/2022   Poorly controlled diabetes mellitus (HCC) 05/30/2022   Acute osteomyelitis of right foot (HCC) 05/19/2022   Wound infection 05/18/2022   Ulcer of right ankle, with fat layer exposed (HCC) 05/18/2022   Non-healing wound of lower extremity, initial encounter 05/18/2022   CAD with Hx of CABG 05/18/2022   Chronic respiratory failure with hypoxia (HCC) 05/18/2022   S/P BKA (below knee amputation), left (HCC) 05/18/2022   COPD (chronic obstructive pulmonary disease) (HCC) 05/18/2022   Perinephric hematoma 03/20/2022   Bacteremia due to Klebsiella pneumoniae 03/19/2022   Hyperglycemia 03/16/2022   Septic shock (HCC)  03/16/2022   Sepsis secondary to UTI (HCC) 03/15/2022   Acute hypoxemic respiratory failure (HCC) 02/13/2022   Atrial fibrillation with rapid ventricular response (HCC) 02/11/2022   Uncontrolled type 2 diabetes mellitus with hyperglycemia, with long-term current use of insulin (HCC) 02/11/2022   Dyslipidemia 02/11/2022   Hypothyroidism 02/11/2022   Benign prostatic hyperplasia with lower urinary tract symptoms 02/11/2022   Pressure injury of skin of heel 11/04/2021   UTI (urinary tract infection) 10/22/2021   Iron deficiency anemia 10/19/2021   Hyperkalemia 10/14/2021   Acute urinary retention 10/08/2021   Sepsis due to methicillin resistant Staphylococcus aureus (MRSA) (HCC) 10/04/2021   Acute delirium 10/04/2021   Acute on chronic respiratory failure with hypoxia and hypercapnia (HCC) 10/04/2021   Acute respiratory failure with hypoxia and hypercapnia (HCC) 10/03/2021   OSA (obstructive sleep apnea) 10/03/2021   Malnutrition of moderate degree 09/29/2021   Generalized weakness 09/29/2021   Cellulitis of left foot 08/04/2021   Uncontrolled type 2 diabetes mellitus with hypoglycemia, with long-term current use of insulin (HCC) 08/04/2021  Bilateral lower extremity edema 08/04/2021   Acute osteomyelitis of lower leg, left (HCC) 08/04/2021   Swelling of limb 07/18/2021   CHF exacerbation (HCC) 05/08/2021   Diabetes mellitus without complication (HCC)    Atrial fibrillation with RVR (HCC)    Obesity, Class III, BMI 40-49.9 (morbid obesity) (HCC)    Venous stasis ulcer (HCC)    Sinus tachycardia    BPH (benign prostatic hyperplasia)    Chronic anticoagulation    Anasarca    Chronic diastolic CHF (congestive heart failure) (HCC)    Venous stasis of both lower extremities    AF (paroxysmal atrial fibrillation) (HCC)    Lower extremity cellulitis 01/03/2021   Chronic osteomyelitis of left foot (HCC) 01/03/2021   Osteomyelitis (HCC) 01/03/2021   Severe sepsis (HCC) 02/24/2020   AKI  (acute kidney injury) (HCC) 02/24/2020   Bilateral cellulitis of lower leg 02/23/2020   CAD (coronary artery disease) 02/02/2020   RLS (restless legs syndrome) 05/15/2019   Statin myopathy 05/15/2019   Type 2 diabetes mellitus with obesity (HCC) 02/13/2016   Essential hypertension 02/13/2016   PAD (peripheral artery disease) (HCC) 02/13/2016   Pressure injury of skin 01/25/2016   Hypoglycemia associated with diabetes (HCC) 12/27/2013   Long-term insulin use (HCC) 12/27/2013   Microalbuminuria 12/27/2013   B12 deficiency 09/29/2013   Obesity 09/29/2013   CAD (coronary artery disease), autologous vein bypass graft 07/20/2013   Hypersomnia with sleep apnea 07/20/2013   Pure hypercholesterolemia 07/20/2013   Osteomyelitis of ankle or foot 07/20/2011    PCP: Dr. Aram Beecham  REFERRING PROVIDER: Sheppard Plumber, NP  REFERRING DIAG: [phone] (ICD-10-CM) - Status post below-knee amputation of right lower extremity (HCC)   THERAPY DIAG:  Difficulty in walking, not elsewhere classified  Other lack of coordination  Abnormality of gait and mobility  Muscle weakness (generalized)  Other abnormalities of gait and mobility  Unsteadiness on feet  Rationale for Evaluation and Treatment: Rehabilitation  ONSET DATE: 05/24/2022 (date of surgery for latest Below knee amputation- RLE)   SUBJECTIVE:   SUBJECTIVE STATEMENT: Pt stated that he was able to get prosthetics on today. Pt stated that he had mild swelling in RLE, but it did not prevent him from getting prosthetic on today.     PERTINENT HISTORY: Patient is a 67 year old male with CAD/PAF on Eliquis followed by Cardiology.. Obese with BMI>38. HTN stable on meds. Has HLD not on statin due to myopathy. Also with DM on insulin and thyroid dz on Synthroid. (See above section for all PMH) Patient reports he is eager to be here and a long road to recovery- Reports having his left leg amputated last year (10/03/2021) and most recently his  right LE - both below the knee amputations. Reports very dependent with all mobility and states ready to get stronger and on his feet. States just received his prosthesis yesterday.   Patient using [facility] for prosthetic care PAIN:  Are you having pain? No  PRECAUTIONS: Fall  RED FLAGS: None   WEIGHT BEARING RESTRICTIONS: No  FALLS:  Has patient fallen in last 6 months?  Will ask next visit  LIVING ENVIRONMENT: Lives with: lives with their spouse Lives in: House/apartment Stairs:  split level home - 2 step down to den- ramp for entry in home.  Has following equipment at home: Quad cane large base, Environmental consultant - 2 wheeled, Wheelchair (manual), [employer], Ramped entry, and [employer], hoyer lift  OCCUPATION: Psychologist, occupational- retired  PLOF: Independent- prior to 10/03/2021 (left BKA)  PATIENT GOALS: "I want to walk with my prostheses"  NEXT MD VISIT: TBA  OBJECTIVE:   TODAY'S TREATMENT:                                                                                                                              DATE: 02/27/2023    Therapeutic Activities:    *All activities performed in // bars   Static standing trials x 5 with +2 person assist  (max assist) 1 in front and 1 in behind- patient pulling up using // bars. Focused on standing as erect as possible- on 5th attempt, pt was able to perform 10 marches with LLE.   Therapeutic Exercises:  LAQ 2 x 10 each LE Hip flex 2 x 10 reps LE   HOME EXERCISE PROGRAM: Access Code: [email or web address] URL: [email or web address] Date: 12/20/2022 Prepared by: Maureen Ralphs  Exercises - Supine Quad Set (BKA)  - 1 x daily - 3 x weekly - 3 sets - 10 reps - Sidelying Hip Abduction wtih Flexion and Extension (BKA)  - 1 x daily - 3 x weekly - 3 sets - 10 reps - Supine Single Leg Bridge with Sound Leg (BKA)  - 1 x daily - 3 x weekly - 3 sets - 10 reps - Supine Hip Extension with Towel Roll (BKA)  - 1 x daily - 3 x weekly - 3 sets - 10  reps - Hooklying Clamshell with Resistance  - 1 x daily - 3 x weekly - 3 sets - 10 reps    -determination of a schedule at home for wearing of prosthese and shrinkers; recommended on 3x daily for prostheses with ad lib increase in time. -discussed pros and cons of shrink use/disuse overnight   ASSESSMENT:  CLINICAL IMPRESSION: Pt responded well to treatment today. Pt was able to come into standing well with 2 person assist in // bars. On the 5th attempt, pt was able to complete 10 static marches with his LLE, which is the first time he has been able to do that in PT. Pt should continue to progress to be able to march with both LE's in static standing, in order to achieve the ultimate goal of independent ambulation. Pt will benefit from skilled PT services to improve his overall LE strength, transfer ability, and possibly able to return to walking with prosthetic training for improved mobility and quality of life.    OBJECTIVE IMPAIRMENTS: decreased activity tolerance, decreased balance, decreased endurance, difficulty walking, decreased strength, and obesity.   ACTIVITY LIMITATIONS: carrying, lifting, bending, standing, squatting, stairs, transfers, bed mobility, bathing, toileting, and dressing  PARTICIPATION LIMITATIONS: cleaning, driving, shopping, community activity, occupation, and yard work  PERSONAL FACTORS: Time since onset of injury/illness/exacerbation and 3+ comorbidities: DM, COPD, PAD  are also affecting patient's functional outcome.   REHAB POTENTIAL: Good  CLINICAL DECISION MAKING: Evolving/moderate complexity  EVALUATION COMPLEXITY: Moderate   GOALS: Goals reviewed with patient? Yes  SHORT TERM GOALS:  Target date: 01/09/2023    1. Pt will be independent with HEP in order to improve strength and balance in order to decrease fall risk and improve function at home    Baseline: No formal HEP in place;  02/21/2023-Patient verbalized good understanding of HEP and  practicing some with prosthesis donned and some with them off.    Goal status: ONGOING   LONG TERM GOALS: Target date: 02/20/2023   1.  Patient will demonstrate modified independent with all bed mobility (sup<-> sit)  for optimal function at home  Baseline: EVAL - Will assess 2nd visit; 01/08/2023- patient able to roll with assist of wife;  Goal status: MET  2.  Patient will increase FOTO score to equal to or greater than  52 to demonstrate statistically significant improvement in mobility and quality of life.  Baseline: EVAL = 41 Goal status: INITIAL   3.  Patient will demonstrate all slide board transfers with Min assist (Bed <-> chair)   Baseline: EVAL= Patient unable to transfer from chair to bed due to increased musle weakness; 01/08/2023- Patient continues to require hoyer lift for all transfers; 02/21/2023- no changes since 9/10 with continued use of both hoyer and slide board transfers at home. Goal status: ONGOING  4. Patient and wife will be independent with donning prostheses consistently able to get 2-3 clicks each LE for improved ability to don his legs to prepare for transfers/gait.    Baseline: 02/21/2023- Patient and wife are not able to consistently don prostheses - experiencing some difficulty at times.   Goal Status: New  5. Patient will demonstrate ability to stand > using walker and CGA only  for improved functional mobility and to prepare for pregait/and prosthetic gait training.     Baseline: 02/21/2023- Patient able to stand yet very inconsistent with times and amount of assist - min assist +2 to max    Assist.    Goal status: NEW    PLAN:  PT FREQUENCY: 1-2x/week  PT DURATION: 12 weeks  PLANNED INTERVENTIONS: Therapeutic exercises, Therapeutic activity, Neuromuscular re-education, Balance training, Gait training, Patient/Family education, Self Care, Joint mobilization, Joint manipulation, Vestibular training, Canalith repositioning, Prosthetic  training, DME instructions, Dry Needling, Electrical stimulation, Wheelchair mobility training, Spinal manipulation, Spinal mobilization, Cryotherapy, Moist heat, Compression bandaging, scar mobilization, Manual therapy, and Re-evaluation  PLAN FOR NEXT SESSION:  Continue to progress LE strengthening and therapeutic activities/prosthetic training.  Functional standing to prepare for transfers and potential walking in future.    10:02 AM, 02/28/23 Louis Meckel, PT Physical Therapist - [place] [facility]  Outpatient Physical Therapy- Main Campus [phone]    Debara Pickett, SPT   This entire session was performed under direct supervision and direction of a licensed therapist/therapist assistant . I have personally read, edited and approve of the note as written.   Lenda Kelp, PT 02/28/2023, 10:02 AM

## 2023-03-02 ENCOUNTER — Other Ambulatory Visit: Payer: Self-pay | Admitting: Cardiovascular Disease

## 2023-03-04 ENCOUNTER — Ambulatory Visit: Payer: Medicare Other | Attending: Nurse Practitioner

## 2023-03-04 DIAGNOSIS — M6281 Muscle weakness (generalized): Secondary | ICD-10-CM | POA: Diagnosis present

## 2023-03-04 DIAGNOSIS — R269 Unspecified abnormalities of gait and mobility: Secondary | ICD-10-CM | POA: Diagnosis present

## 2023-03-04 DIAGNOSIS — R2681 Unsteadiness on feet: Secondary | ICD-10-CM | POA: Diagnosis present

## 2023-03-04 DIAGNOSIS — R278 Other lack of coordination: Secondary | ICD-10-CM | POA: Diagnosis present

## 2023-03-04 DIAGNOSIS — R2689 Other abnormalities of gait and mobility: Secondary | ICD-10-CM | POA: Diagnosis present

## 2023-03-04 DIAGNOSIS — R262 Difficulty in walking, not elsewhere classified: Secondary | ICD-10-CM | POA: Diagnosis present

## 2023-03-04 NOTE — Therapy (Signed)
OUTPATIENT PHYSICAL THERAPY TREATMENT    Patient Name: [name]. MRN: [id number] DOB:11-20-1955, 67 y.o., male Today's Date: 03/05/2023  END OF SESSION:  PT End of Session - 03/04/23 1724     Visit Number 23    Number of Visits 44    Date for PT Re-Evaluation 05/16/23    Authorization Type [employer] Medicare    Progress Note Due on Visit 30    PT Start Time 1445    PT Stop Time 1525    PT Time Calculation (min) 40 min    Equipment Utilized During Treatment Gait belt    Activity Tolerance Patient tolerated treatment well;No increased pain;Patient limited by fatigue    Behavior During Therapy [facility] for tasks assessed/performed                       Past Medical History:  Diagnosis Date   Arrhythmia    atrial fibrillation   Asthma    CHF (congestive heart failure) (HCC)    COPD (chronic obstructive pulmonary disease) (HCC)    Coronary artery disease    Depression    Diabetes mellitus without complication (HCC)    Gout    History anabolic steroid use    Hyperlipidemia    Hypertension    Hypogonadism in male    MI (myocardial infarction) (HCC)    Morbid obesity (HCC)    Myocardial infarction (HCC)    Peripheral vascular disease (HCC)    Perirectal abscess    Pleurisy    Sleep apnea    CPAP at night, no oxygen   Varicella    Past Surgical History:  Procedure Laterality Date   ABDOMINAL AORTIC ANEURYSM REPAIR     ACHILLES TENDON SURGERY Left 01/10/2021   Procedure: ACHILLES LENGTHENING/KIDNER;  Surgeon: Rosetta Posner, DPM;  Location: [facility];  Service: Podiatry;  Laterality: Left;   AMPUTATION Left 10/03/2021   Procedure: AMPUTATION BELOW KNEE;  Surgeon: Renford Dills, MD;  Location: [facility];  Service: Vascular;  Laterality: Left;   AMPUTATION Right 05/24/2022   Procedure: AMPUTATION BELOW KNEE;  Surgeon: Annice Needy, MD;  Location: [facility];  Service: General;  Laterality: Right;   AMPUTATION TOE Right 02/10/2016   Procedure: AMPUTATION  TOE 3RD TOE;  Surgeon: Gwyneth Revels, DPM;  Location: [facility];  Service: Podiatry;  Laterality: Right;   AMPUTATION TOE Left 02/24/2020   Procedure: AMPUTATION TOE;  Surgeon: Rosetta Posner, DPM;  Location: [facility];  Service: Podiatry;  Laterality: Left;   APPLICATION OF WOUND VAC Left 02/29/2020   Procedure: APPLICATION OF WOUND VAC;  Surgeon: Rosetta Posner, DPM;  Location: [facility];  Service: Podiatry;  Laterality: Left;   CARDIAC CATHETERIZATION     CARDIOVERSION [name] 02/13/2022   Procedure: CARDIOVERSION;  Surgeon: Lamar Blinks, MD;  Location: [facility];  Service: Cardiovascular;  Laterality: [name];   COLONOSCOPY WITH PROPOFOL [name] 11/18/2015   Procedure: COLONOSCOPY WITH PROPOFOL;  Surgeon: Scot Jun, MD;  Location: [facility];  Service: Endoscopy;  Laterality: [name];   CORONARY ARTERY BYPASS GRAFT     CORONARY STENT INTERVENTION [name] 02/02/2020   Procedure: CORONARY STENT INTERVENTION;  Surgeon: Marcina Millard, MD;  Location: [facility];  Service: Cardiovascular;  Laterality: [name];   INCISION AND DRAINAGE Left 08/07/2021   Procedure: INCISION AND DRAINAGE-Partial Calcanectomy;  Surgeon: Rosetta Posner, DPM;  Location: [facility];  Service: Podiatry;  Laterality: Left;   IRRIGATION AND DEBRIDEMENT FOOT Left 02/29/2020   Procedure:  IRRIGATION AND DEBRIDEMENT FOOT;  Surgeon: Rosetta Posner, DPM;  Location: [facility];  Service: Podiatry;  Laterality: Left;   IRRIGATION AND DEBRIDEMENT FOOT Left 02/24/2020   Procedure: IRRIGATION AND DEBRIDEMENT FOOT;  Surgeon: Rosetta Posner, DPM;  Location: [facility];  Service: Podiatry;  Laterality: Left;   KNEE ARTHROSCOPY     LEFT HEART CATH AND CORS/GRAFTS ANGIOGRAPHY [name] 02/02/2020   Procedure: LEFT HEART CATH AND CORS/GRAFTS ANGIOGRAPHY;  Surgeon: Dalia Heading, MD;  Location: [facility];  Service: Cardiovascular;  Laterality: [name];   LOWER EXTREMITY ANGIOGRAPHY Left 02/25/2020   Procedure: Lower Extremity Angiography;   Surgeon: Annice Needy, MD;  Location: [facility];  Service: Cardiovascular;  Laterality: Left;   LOWER EXTREMITY ANGIOGRAPHY Left 01/04/2021   Procedure: LOWER EXTREMITY ANGIOGRAPHY;  Surgeon: Annice Needy, MD;  Location: [facility];  Service: Cardiovascular;  Laterality: Left;   LOWER EXTREMITY ANGIOGRAPHY Right 05/21/2022   Procedure: Lower Extremity Angiography;  Surgeon: Annice Needy, MD;  Location: [facility];  Service: Cardiovascular;  Laterality: Right;   METATARSAL HEAD EXCISION Left 01/10/2021   Procedure: METATARSAL HEAD EXCISION - LEFT 5th;  Surgeon: Rosetta Posner, DPM;  Location: [facility];  Service: Podiatry;  Laterality: Left;   PERIPHERAL VASCULAR CATHETERIZATION Right 01/24/2016   Procedure: Lower Extremity Angiography;  Surgeon: Renford Dills, MD;  Location: [facility];  Service: Cardiovascular;  Laterality: Right;   PERIPHERAL VASCULAR CATHETERIZATION Right 01/25/2016   Procedure: Lower Extremity Angiography;  Surgeon: Renford Dills, MD;  Location: [facility];  Service: Cardiovascular;  Laterality: Right;   TOE AMPUTATION     TONSILLECTOMY     Patient Active Problem List   Diagnosis Date Noted   Status post below-knee amputation of right lower extremity (HCC) 05/31/2022   S/P amputation 05/30/2022   Poorly controlled diabetes mellitus (HCC) 05/30/2022   Acute osteomyelitis of right foot (HCC) 05/19/2022   Wound infection 05/18/2022   Ulcer of right ankle, with fat layer exposed (HCC) 05/18/2022   Non-healing wound of lower extremity, initial encounter 05/18/2022   CAD with Hx of CABG 05/18/2022   Chronic respiratory failure with hypoxia (HCC) 05/18/2022   S/P BKA (below knee amputation), left (HCC) 05/18/2022   COPD (chronic obstructive pulmonary disease) (HCC) 05/18/2022   Perinephric hematoma 03/20/2022   Bacteremia due to Klebsiella pneumoniae 03/19/2022   Hyperglycemia 03/16/2022   Septic shock (HCC)  03/16/2022   Sepsis secondary to UTI (HCC) 03/15/2022   Acute hypoxemic respiratory failure (HCC) 02/13/2022   Atrial fibrillation with rapid ventricular response (HCC) 02/11/2022   Uncontrolled type 2 diabetes mellitus with hyperglycemia, with long-term current use of insulin (HCC) 02/11/2022   Dyslipidemia 02/11/2022   Hypothyroidism 02/11/2022   Benign prostatic hyperplasia with lower urinary tract symptoms 02/11/2022   Pressure injury of skin of heel 11/04/2021   UTI (urinary tract infection) 10/22/2021   Iron deficiency anemia 10/19/2021   Hyperkalemia 10/14/2021   Acute urinary retention 10/08/2021   Sepsis due to methicillin resistant Staphylococcus aureus (MRSA) (HCC) 10/04/2021   Acute delirium 10/04/2021   Acute on chronic respiratory failure with hypoxia and hypercapnia (HCC) 10/04/2021   Acute respiratory failure with hypoxia and hypercapnia (HCC) 10/03/2021   OSA (obstructive sleep apnea) 10/03/2021   Malnutrition of moderate degree 09/29/2021   Generalized weakness 09/29/2021   Cellulitis of left foot 08/04/2021   Uncontrolled type 2 diabetes mellitus with hypoglycemia, with long-term current use of insulin (HCC) 08/04/2021  Bilateral lower extremity edema 08/04/2021   Acute osteomyelitis of lower leg, left (HCC) 08/04/2021   Swelling of limb 07/18/2021   CHF exacerbation (HCC) 05/08/2021   Diabetes mellitus without complication (HCC)    Atrial fibrillation with RVR (HCC)    Obesity, Class III, BMI 40-49.9 (morbid obesity) (HCC)    Venous stasis ulcer (HCC)    Sinus tachycardia    BPH (benign prostatic hyperplasia)    Chronic anticoagulation    Anasarca    Chronic diastolic CHF (congestive heart failure) (HCC)    Venous stasis of both lower extremities    AF (paroxysmal atrial fibrillation) (HCC)    Lower extremity cellulitis 01/03/2021   Chronic osteomyelitis of left foot (HCC) 01/03/2021   Osteomyelitis (HCC) 01/03/2021   Severe sepsis (HCC) 02/24/2020   AKI  (acute kidney injury) (HCC) 02/24/2020   Bilateral cellulitis of lower leg 02/23/2020   CAD (coronary artery disease) 02/02/2020   RLS (restless legs syndrome) 05/15/2019   Statin myopathy 05/15/2019   Type 2 diabetes mellitus with obesity (HCC) 02/13/2016   Essential hypertension 02/13/2016   PAD (peripheral artery disease) (HCC) 02/13/2016   Pressure injury of skin 01/25/2016   Hypoglycemia associated with diabetes (HCC) 12/27/2013   Long-term insulin use (HCC) 12/27/2013   Microalbuminuria 12/27/2013   B12 deficiency 09/29/2013   Obesity 09/29/2013   CAD (coronary artery disease), autologous vein bypass graft 07/20/2013   Hypersomnia with sleep apnea 07/20/2013   Pure hypercholesterolemia 07/20/2013   Osteomyelitis of ankle or foot 07/20/2011    PCP: Dr. Aram Beecham  REFERRING PROVIDER: Sheppard Plumber, NP  REFERRING DIAG: [id number] (ICD-10-CM) - Status post below-knee amputation of right lower extremity (HCC)   THERAPY DIAG:  Difficulty in walking, not elsewhere classified  Other lack of coordination  Abnormality of gait and mobility  Muscle weakness (generalized)  Other abnormalities of gait and mobility  Unsteadiness on feet  Rationale for Evaluation and Treatment: Rehabilitation  ONSET DATE: 05/24/2022 (date of surgery for latest Below knee amputation- RLE)   SUBJECTIVE:   SUBJECTIVE STATEMENT: Patient reports no new issues since last visit. States able to get at least 2 clicks each leg and denies any falls at home.     PERTINENT HISTORY: Patient is a 67 year old male with CAD/PAF on Eliquis followed by Cardiology.. Obese with BMI>38. HTN stable on meds. Has HLD not on statin due to myopathy. Also with DM on insulin and thyroid dz on Synthroid. (See above section for all PMH) Patient reports he is eager to be here and a long road to recovery- Reports having his left leg amputated last year (10/03/2021) and most recently his right LE - both below the knee  amputations. Reports very dependent with all mobility and states ready to get stronger and on his feet. States just received his prosthesis yesterday.   Patient using [facility] for prosthetic care PAIN:  Are you having pain? No  PRECAUTIONS: Fall  RED FLAGS: None   WEIGHT BEARING RESTRICTIONS: No  FALLS:  Has patient fallen in last 6 months?  Will ask next visit  LIVING ENVIRONMENT: Lives with: lives with their spouse Lives in: House/apartment Stairs:  split level home - 2 step down to den- ramp for entry in home.  Has following equipment at home: Quad cane large base, Environmental consultant - 2 wheeled, Wheelchair (manual), [employer], Ramped entry, and [employer], hoyer lift  OCCUPATION: Psychologist, occupational- retired  PLOF: Independent- prior to 10/03/2021 (left BKA)   PATIENT GOALS: "I want to  walk with my prostheses"  NEXT MD VISIT: TBA  OBJECTIVE:   TODAY'S TREATMENT:                                                                                                                              DATE: 02/27/2023      Therapeutic Activities:    *All activities performed in // bars   Sit to stand x 5 trials- max assist x 1 in front and CGA of 2nd person behind patient. VC for anterior trunk lean. Patient requested to adjust height of bars to assist him in standing- Adjusted to L7 today. Patient also requested to widen his stance in hopes of improved transfer and ability to stand.   Static standing trials x 5 with +2 person assist  (max assist) 1 in front and 1 in behind- patient pulling up using // bars. Focused on standing as erect as possible- Most attempts continue to require max assist to stand and to lean forward. He was able to utilize mirror as feedback with erect standing - Demonstrated more anterior placed feet in relation to hips/upper trunk- placing him in more hyperextended position- making standing difficult.     HOME EXERCISE PROGRAM: Access Code: [email or web address] URL:  [email or web address] Date: 12/20/2022 Prepared by: Maureen Ralphs  Exercises - Supine Quad Set (BKA)  - 1 x daily - 3 x weekly - 3 sets - 10 reps - Sidelying Hip Abduction wtih Flexion and Extension (BKA)  - 1 x daily - 3 x weekly - 3 sets - 10 reps - Supine Single Leg Bridge with Sound Leg (BKA)  - 1 x daily - 3 x weekly - 3 sets - 10 reps - Supine Hip Extension with Towel Roll (BKA)  - 1 x daily - 3 x weekly - 3 sets - 10 reps - Hooklying Clamshell with Resistance  - 1 x daily - 3 x weekly - 3 sets - 10 reps    -determination of a schedule at home for wearing of prosthese and shrinkers; recommended on 3x daily for prostheses with ad lib increase in time. -discussed pros and cons of shrink use/disuse overnight   ASSESSMENT:  CLINICAL IMPRESSION: Patient continues to practice with standing in // bars (versus using tools to assist him -raised height of mat or sit/stand lift) Pt responded well to treatment today. Pt continues to present with very strong upper body yet continues to require max assist to stand and remain standing with more difficulty anterior weight shift vs. Previous visit. He works hard and was able to demo some in session progress with more erect standing on 3rd trial. Will go back to working on standing from mat table with 1 person on each side to see if can assist with more anterior weight shift for improved desired ability to stand erect. Pt will benefit from skilled PT services to improve his overall LE strength, transfer ability, and possibly able to return to walking with  prosthetic training for improved mobility and quality of life.    OBJECTIVE IMPAIRMENTS: decreased activity tolerance, decreased balance, decreased endurance, difficulty walking, decreased strength, and obesity.   ACTIVITY LIMITATIONS: carrying, lifting, bending, standing, squatting, stairs, transfers, bed mobility, bathing, toileting, and dressing  PARTICIPATION LIMITATIONS:  cleaning, driving, shopping, community activity, occupation, and yard work  PERSONAL FACTORS: Time since onset of injury/illness/exacerbation and 3+ comorbidities: DM, COPD, PAD  are also affecting patient's functional outcome.   REHAB POTENTIAL: Good  CLINICAL DECISION MAKING: Evolving/moderate complexity  EVALUATION COMPLEXITY: Moderate   GOALS: Goals reviewed with patient? Yes  SHORT TERM GOALS: Target date: 01/09/2023    1. Pt will be independent with HEP in order to improve strength and balance in order to decrease fall risk and improve function at home    Baseline: No formal HEP in place;  02/21/2023-Patient verbalized good understanding of HEP and practicing some with prosthesis donned and some with them off.    Goal status: ONGOING   LONG TERM GOALS: Target date: 02/20/2023   1.  Patient will demonstrate modified independent with all bed mobility (sup<-> sit)  for optimal function at home  Baseline: EVAL - Will assess 2nd visit; 01/08/2023- patient able to roll with assist of wife;  Goal status: MET  2.  Patient will increase FOTO score to equal to or greater than  52 to demonstrate statistically significant improvement in mobility and quality of life.  Baseline: EVAL = 41 Goal status: INITIAL   3.  Patient will demonstrate all slide board transfers with Min assist (Bed <-> chair)   Baseline: EVAL= Patient unable to transfer from chair to bed due to increased musle weakness; 01/08/2023- Patient continues to require hoyer lift for all transfers; 02/21/2023- no changes since 9/10 with continued use of both hoyer and slide board transfers at home. Goal status: ONGOING  4. Patient and wife will be independent with donning prostheses consistently able to get 2-3 clicks each LE for improved ability to don his legs to prepare for transfers/gait.    Baseline: 02/21/2023- Patient and wife are not able to consistently don prostheses - experiencing some difficulty at times.   Goal  Status: New  5. Patient will demonstrate ability to stand > using walker and CGA only  for improved functional mobility and to prepare for pregait/and prosthetic gait training.     Baseline: 02/21/2023- Patient able to stand yet very inconsistent with times and amount of assist - min assist +2 to max    Assist.    Goal status: NEW    PLAN:  PT FREQUENCY: 1-2x/week  PT DURATION: 12 weeks  PLANNED INTERVENTIONS: Therapeutic exercises, Therapeutic activity, Neuromuscular re-education, Balance training, Gait training, Patient/Family education, Self Care, Joint mobilization, Joint manipulation, Vestibular training, Canalith repositioning, Prosthetic training, DME instructions, Dry Needling, Electrical stimulation, Wheelchair mobility training, Spinal manipulation, Spinal mobilization, Cryotherapy, Moist heat, Compression bandaging, scar mobilization, Manual therapy, and Re-evaluation  PLAN FOR NEXT SESSION:  Continue to progress LE strengthening and therapeutic activities/prosthetic training.  Functional standing to prepare for transfers and potential walking in future.    8:39 AM, 03/05/23 Louis Meckel, PT Physical Therapist - [facility]  Outpatient Physical Therapy- Main [facility] [phone]    Debara Pickett, SPT   This entire session was performed under direct supervision and direction of a licensed therapist/therapist assistant . I have personally read, edited and approve of the note as written.   Lenda Kelp, PT 03/05/2023, 8:39 AM

## 2023-03-06 ENCOUNTER — Ambulatory Visit: Payer: Medicare Other

## 2023-03-06 DIAGNOSIS — R262 Difficulty in walking, not elsewhere classified: Secondary | ICD-10-CM

## 2023-03-06 DIAGNOSIS — R2681 Unsteadiness on feet: Secondary | ICD-10-CM

## 2023-03-06 DIAGNOSIS — R278 Other lack of coordination: Secondary | ICD-10-CM

## 2023-03-06 DIAGNOSIS — R2689 Other abnormalities of gait and mobility: Secondary | ICD-10-CM

## 2023-03-06 DIAGNOSIS — R269 Unspecified abnormalities of gait and mobility: Secondary | ICD-10-CM

## 2023-03-06 DIAGNOSIS — M6281 Muscle weakness (generalized): Secondary | ICD-10-CM

## 2023-03-06 NOTE — Therapy (Signed)
OUTPATIENT PHYSICAL THERAPY TREATMENT    Patient Name: [name]. MRN: [id number] DOB:04-19-1956, 67 y.o., male Today's Date: 03/06/2023  END OF SESSION:  PT End of Session - 03/06/23 1544     Visit Number 24    Number of Visits 44    Date for PT Re-Evaluation 05/16/23    Authorization Type BCBS Medicare    Progress Note Due on Visit 30    PT Start Time 1445    PT Stop Time 1522    PT Time Calculation (min) 37 min    Equipment Utilized During Treatment Gait belt    Activity Tolerance Patient tolerated treatment well;No increased pain;Patient limited by fatigue    Behavior During Therapy [facility] for tasks assessed/performed                       Past Medical History:  Diagnosis Date   Arrhythmia    atrial fibrillation   Asthma    CHF (congestive heart failure) (HCC)    COPD (chronic obstructive pulmonary disease) (HCC)    Coronary artery disease    Depression    Diabetes mellitus without complication (HCC)    Gout    History anabolic steroid use    Hyperlipidemia    Hypertension    Hypogonadism in male    MI (myocardial infarction) (HCC)    Morbid obesity (HCC)    Myocardial infarction (HCC)    Peripheral vascular disease (HCC)    Perirectal abscess    Pleurisy    Sleep apnea    CPAP at night, no oxygen   Varicella    Past Surgical History:  Procedure Laterality Date   ABDOMINAL AORTIC ANEURYSM REPAIR     ACHILLES TENDON SURGERY Left 01/10/2021   Procedure: ACHILLES LENGTHENING/KIDNER;  Surgeon: Rosetta Posner, DPM;  Location: [facility];  Service: Podiatry;  Laterality: Left;   AMPUTATION Left 10/03/2021   Procedure: AMPUTATION BELOW KNEE;  Surgeon: Renford Dills, MD;  Location: [facility];  Service: Vascular;  Laterality: Left;   AMPUTATION Right 05/24/2022   Procedure: AMPUTATION BELOW KNEE;  Surgeon: Annice Needy, MD;  Location: [facility];  Service: General;  Laterality: Right;   AMPUTATION TOE Right 02/10/2016   Procedure: AMPUTATION  TOE 3RD TOE;  Surgeon: Gwyneth Revels, DPM;  Location: [facility];  Service: Podiatry;  Laterality: Right;   AMPUTATION TOE Left 02/24/2020   Procedure: AMPUTATION TOE;  Surgeon: Rosetta Posner, DPM;  Location: [facility];  Service: Podiatry;  Laterality: Left;   APPLICATION OF WOUND VAC Left 02/29/2020   Procedure: APPLICATION OF WOUND VAC;  Surgeon: Rosetta Posner, DPM;  Location: [facility];  Service: Podiatry;  Laterality: Left;   CARDIAC CATHETERIZATION     CARDIOVERSION [name] 02/13/2022   Procedure: CARDIOVERSION;  Surgeon: Lamar Blinks, MD;  Location: [facility];  Service: Cardiovascular;  Laterality: [name];   COLONOSCOPY WITH PROPOFOL [name] 11/18/2015   Procedure: COLONOSCOPY WITH PROPOFOL;  Surgeon: Scot Jun, MD;  Location: [facility];  Service: Endoscopy;  Laterality: [name];   CORONARY ARTERY BYPASS GRAFT     CORONARY STENT INTERVENTION [name] 02/02/2020   Procedure: CORONARY STENT INTERVENTION;  Surgeon: Marcina Millard, MD;  Location: [facility];  Service: Cardiovascular;  Laterality: [name];   INCISION AND DRAINAGE Left 08/07/2021   Procedure: INCISION AND DRAINAGE-Partial Calcanectomy;  Surgeon: Rosetta Posner, DPM;  Location: [facility];  Service: Podiatry;  Laterality: Left;   IRRIGATION AND DEBRIDEMENT FOOT Left 02/29/2020   Procedure:  IRRIGATION AND DEBRIDEMENT FOOT;  Surgeon: Rosetta Posner, DPM;  Location: [facility];  Service: Podiatry;  Laterality: Left;   IRRIGATION AND DEBRIDEMENT FOOT Left 02/24/2020   Procedure: IRRIGATION AND DEBRIDEMENT FOOT;  Surgeon: Rosetta Posner, DPM;  Location: [facility];  Service: Podiatry;  Laterality: Left;   KNEE ARTHROSCOPY     LEFT HEART CATH AND CORS/GRAFTS ANGIOGRAPHY [name] 02/02/2020   Procedure: LEFT HEART CATH AND CORS/GRAFTS ANGIOGRAPHY;  Surgeon: Dalia Heading, MD;  Location: [facility];  Service: Cardiovascular;  Laterality: [name];   LOWER EXTREMITY ANGIOGRAPHY Left 02/25/2020   Procedure: Lower Extremity Angiography;   Surgeon: Annice Needy, MD;  Location: [facility];  Service: Cardiovascular;  Laterality: Left;   LOWER EXTREMITY ANGIOGRAPHY Left 01/04/2021   Procedure: LOWER EXTREMITY ANGIOGRAPHY;  Surgeon: Annice Needy, MD;  Location: [facility];  Service: Cardiovascular;  Laterality: Left;   LOWER EXTREMITY ANGIOGRAPHY Right 05/21/2022   Procedure: Lower Extremity Angiography;  Surgeon: Annice Needy, MD;  Location: [facility];  Service: Cardiovascular;  Laterality: Right;   METATARSAL HEAD EXCISION Left 01/10/2021   Procedure: METATARSAL HEAD EXCISION - LEFT 5th;  Surgeon: Rosetta Posner, DPM;  Location: [facility];  Service: Podiatry;  Laterality: Left;   PERIPHERAL VASCULAR CATHETERIZATION Right 01/24/2016   Procedure: Lower Extremity Angiography;  Surgeon: Renford Dills, MD;  Location: [facility];  Service: Cardiovascular;  Laterality: Right;   PERIPHERAL VASCULAR CATHETERIZATION Right 01/25/2016   Procedure: Lower Extremity Angiography;  Surgeon: Renford Dills, MD;  Location: [facility];  Service: Cardiovascular;  Laterality: Right;   TOE AMPUTATION     TONSILLECTOMY     Patient Active Problem List   Diagnosis Date Noted   Status post below-knee amputation of right lower extremity (HCC) 05/31/2022   S/P amputation 05/30/2022   Poorly controlled diabetes mellitus (HCC) 05/30/2022   Acute osteomyelitis of right foot (HCC) 05/19/2022   Wound infection 05/18/2022   Ulcer of right ankle, with fat layer exposed (HCC) 05/18/2022   Non-healing wound of lower extremity, initial encounter 05/18/2022   CAD with Hx of CABG 05/18/2022   Chronic respiratory failure with hypoxia (HCC) 05/18/2022   S/P BKA (below knee amputation), left (HCC) 05/18/2022   COPD (chronic obstructive pulmonary disease) (HCC) 05/18/2022   Perinephric hematoma 03/20/2022   Bacteremia due to Klebsiella pneumoniae 03/19/2022   Hyperglycemia 03/16/2022   Septic shock (HCC)  03/16/2022   Sepsis secondary to UTI (HCC) 03/15/2022   Acute hypoxemic respiratory failure (HCC) 02/13/2022   Atrial fibrillation with rapid ventricular response (HCC) 02/11/2022   Uncontrolled type 2 diabetes mellitus with hyperglycemia, with long-term current use of insulin (HCC) 02/11/2022   Dyslipidemia 02/11/2022   Hypothyroidism 02/11/2022   Benign prostatic hyperplasia with lower urinary tract symptoms 02/11/2022   Pressure injury of skin of heel 11/04/2021   UTI (urinary tract infection) 10/22/2021   Iron deficiency anemia 10/19/2021   Hyperkalemia 10/14/2021   Acute urinary retention 10/08/2021   Sepsis due to methicillin resistant Staphylococcus aureus (MRSA) (HCC) 10/04/2021   Acute delirium 10/04/2021   Acute on chronic respiratory failure with hypoxia and hypercapnia (HCC) 10/04/2021   Acute respiratory failure with hypoxia and hypercapnia (HCC) 10/03/2021   OSA (obstructive sleep apnea) 10/03/2021   Malnutrition of moderate degree 09/29/2021   Generalized weakness 09/29/2021   Cellulitis of left foot 08/04/2021   Uncontrolled type 2 diabetes mellitus with hypoglycemia, with long-term current use of insulin (HCC) 08/04/2021  Bilateral lower extremity edema 08/04/2021   Acute osteomyelitis of lower leg, left (HCC) 08/04/2021   Swelling of limb 07/18/2021   CHF exacerbation (HCC) 05/08/2021   Diabetes mellitus without complication (HCC)    Atrial fibrillation with RVR (HCC)    Obesity, Class III, BMI 40-49.9 (morbid obesity) (HCC)    Venous stasis ulcer (HCC)    Sinus tachycardia    BPH (benign prostatic hyperplasia)    Chronic anticoagulation    Anasarca    Chronic diastolic CHF (congestive heart failure) (HCC)    Venous stasis of both lower extremities    AF (paroxysmal atrial fibrillation) (HCC)    Lower extremity cellulitis 01/03/2021   Chronic osteomyelitis of left foot (HCC) 01/03/2021   Osteomyelitis (HCC) 01/03/2021   Severe sepsis (HCC) 02/24/2020   AKI  (acute kidney injury) (HCC) 02/24/2020   Bilateral cellulitis of lower leg 02/23/2020   CAD (coronary artery disease) 02/02/2020   RLS (restless legs syndrome) 05/15/2019   Statin myopathy 05/15/2019   Type 2 diabetes mellitus with obesity (HCC) 02/13/2016   Essential hypertension 02/13/2016   PAD (peripheral artery disease) (HCC) 02/13/2016   Pressure injury of skin 01/25/2016   Hypoglycemia associated with diabetes (HCC) 12/27/2013   Long-term insulin use (HCC) 12/27/2013   Microalbuminuria 12/27/2013   B12 deficiency 09/29/2013   Obesity 09/29/2013   CAD (coronary artery disease), autologous vein bypass graft 07/20/2013   Hypersomnia with sleep apnea 07/20/2013   Pure hypercholesterolemia 07/20/2013   Osteomyelitis of ankle or foot 07/20/2011    PCP: Dr. Aram Beecham  REFERRING PROVIDER: Sheppard Plumber, NP  REFERRING DIAG: [phone] (ICD-10-CM) - Status post below-knee amputation of right lower extremity (HCC)   THERAPY DIAG:  Difficulty in walking, not elsewhere classified  Muscle weakness (generalized)  Other abnormalities of gait and mobility  Other lack of coordination  Abnormality of gait and mobility  Unsteadiness on feet  Rationale for Evaluation and Treatment: Rehabilitation  ONSET DATE: 05/24/2022 (date of surgery for latest Below knee amputation- RLE)   SUBJECTIVE:   SUBJECTIVE STATEMENT: Pt reported 0/10 on NPS and reported no changes since last visit.     PERTINENT HISTORY: Patient is a 67 year old male with CAD/PAF on Eliquis followed by Cardiology.. Obese with BMI>38. HTN stable on meds. Has HLD not on statin due to myopathy. Also with DM on insulin and thyroid dz on Synthroid. (See above section for all PMH) Patient reports he is eager to be here and a long road to recovery- Reports having his left leg amputated last year (10/03/2021) and most recently his right LE - both below the knee amputations. Reports very dependent with all mobility and states  ready to get stronger and on his feet. States just received his prosthesis yesterday.   Patient using [facility] for prosthetic care PAIN:  Are you having pain? No  PRECAUTIONS: Fall  RED FLAGS: None   WEIGHT BEARING RESTRICTIONS: No  FALLS:  Has patient fallen in last 6 months?  Will ask next visit  LIVING ENVIRONMENT: Lives with: lives with their spouse Lives in: House/apartment Stairs:  split level home - 2 step down to den- ramp for entry in home.  Has following equipment at home: Chiropractor, Environmental consultant - 2 wheeled, Wheelchair (manual), [employer], Ramped entry, and [employer], hoyer lift  OCCUPATION: Psychologist, occupational- retired  PLOF: Independent- prior to 10/03/2021 (left BKA)   PATIENT GOALS: "I want to walk with my prostheses"  NEXT MD VISIT: TBA  OBJECTIVE:  TODAY'S TREATMENT:                                                                                                                              DATE: 02/27/2023      Therapeutic Activities:    *All activities performed in // bars   A prosthetist was present during today and adjusted the pt's R prosthetic by internally rotating the R foot.   Static standing trials x 3 with +2 person assist  (max assist) 1 in front and 1 in behind- patient pulling up using // bars. Focused on standing as erect as possible- Most attempts continue to require max assist to stand and to lean forward. He was able to utilize mirror as feedback with erect standing - Demonstrated more anterior placed feet in relation to hips/upper trunk- placing him in more hyperextended position- making standing difficult. Pt completed 2 standing marches with each LE on the 3rd trial.     HOME EXERCISE PROGRAM: Access Code: [email or web address] URL: [email or web address] Date: 12/20/2022 Prepared by: Maureen Ralphs  Exercises - Supine Quad Set (BKA)  - 1 x daily - 3 x weekly - 3 sets - 10 reps - Sidelying Hip Abduction wtih Flexion and  Extension (BKA)  - 1 x daily - 3 x weekly - 3 sets - 10 reps - Supine Single Leg Bridge with Sound Leg (BKA)  - 1 x daily - 3 x weekly - 3 sets - 10 reps - Supine Hip Extension with Towel Roll (BKA)  - 1 x daily - 3 x weekly - 3 sets - 10 reps - Hooklying Clamshell with Resistance  - 1 x daily - 3 x weekly - 3 sets - 10 reps    -determination of a schedule at home for wearing of prosthese and shrinkers; recommended on 3x daily for prostheses with ad lib increase in time. -discussed pros and cons of shrink use/disuse overnight   ASSESSMENT:  CLINICAL IMPRESSION: The prosthetist that was present during today's visit adjusted the pt's R prosthetic as described above. After the patient's R prosthetic was adjusted, the pt expressed more confidence in weight shifting onto his RLE. On the 3rd static standing trial, the pt was able to complete 2 marches with each LE. When the pt comes into standing, he still demonstrates difficulty with hip extension when attempting to stand fully erect. Pt was instructed to continue therex at home that focuses on increasing hip extension ROM. Pt will benefit from skilled PT services to improve his overall LE strength, transfer ability, and possibly able to return to walking with prosthetic training for improved mobility and quality of life.    OBJECTIVE IMPAIRMENTS: decreased activity tolerance, decreased balance, decreased endurance, difficulty walking, decreased strength, and obesity.   ACTIVITY LIMITATIONS: carrying, lifting, bending, standing, squatting, stairs, transfers, bed mobility, bathing, toileting, and dressing  PARTICIPATION LIMITATIONS: cleaning, driving, shopping, community activity, occupation, and yard work  PERSONAL FACTORS: Time since onset of  injury/illness/exacerbation and 3+ comorbidities: DM, COPD, PAD  are also affecting patient's functional outcome.   REHAB POTENTIAL: Good  CLINICAL DECISION MAKING: Evolving/moderate  complexity  EVALUATION COMPLEXITY: Moderate   GOALS: Goals reviewed with patient? Yes  SHORT TERM GOALS: Target date: 01/09/2023    1. Pt will be independent with HEP in order to improve strength and balance in order to decrease fall risk and improve function at home    Baseline: No formal HEP in place;  02/21/2023-Patient verbalized good understanding of HEP and practicing some with prosthesis donned and some with them off.    Goal status: ONGOING   LONG TERM GOALS: Target date: 02/20/2023   1.  Patient will demonstrate modified independent with all bed mobility (sup<-> sit)  for optimal function at home  Baseline: EVAL - Will assess 2nd visit; 01/08/2023- patient able to roll with assist of wife;  Goal status: MET  2.  Patient will increase FOTO score to equal to or greater than  52 to demonstrate statistically significant improvement in mobility and quality of life.  Baseline: EVAL = 41 Goal status: INITIAL   3.  Patient will demonstrate all slide board transfers with Min assist (Bed <-> chair)   Baseline: EVAL= Patient unable to transfer from chair to bed due to increased musle weakness; 01/08/2023- Patient continues to require hoyer lift for all transfers; 02/21/2023- no changes since 9/10 with continued use of both hoyer and slide board transfers at home. Goal status: ONGOING  4. Patient and wife will be independent with donning prostheses consistently able to get 2-3 clicks each LE for improved ability to don his legs to prepare for transfers/gait.    Baseline: 02/21/2023- Patient and wife are not able to consistently don prostheses - experiencing some difficulty at times.   Goal Status: New  5. Patient will demonstrate ability to stand > using walker and CGA only  for improved functional mobility and to prepare for pregait/and prosthetic gait training.     Baseline: 02/21/2023- Patient able to stand yet very inconsistent with times and amount of assist - min assist +2  to max    Assist.    Goal status: NEW    PLAN:  PT FREQUENCY: 1-2x/week  PT DURATION: 12 weeks  PLANNED INTERVENTIONS: Therapeutic exercises, Therapeutic activity, Neuromuscular re-education, Balance training, Gait training, Patient/Family education, Self Care, Joint mobilization, Joint manipulation, Vestibular training, Canalith repositioning, Prosthetic training, DME instructions, Dry Needling, Electrical stimulation, Wheelchair mobility training, Spinal manipulation, Spinal mobilization, Cryotherapy, Moist heat, Compression bandaging, scar mobilization, Manual therapy, and Re-evaluation  PLAN FOR NEXT SESSION:  Continue to progress LE strengthening and therapeutic activities/prosthetic training.  Functional standing to prepare for transfers and potential walking in future.    3:45 PM, 03/06/23 Louis Meckel, PT Physical Therapist - [facility]  Outpatient Physical Therapy- Main Campus [phone]    Debara Pickett, SPT   This entire session was performed under direct supervision and direction of a licensed therapist/therapist assistant . I have personally read, edited and approve of the note as written.   Debara Pickett, Student-PT 03/06/2023, 3:45 PM

## 2023-03-07 ENCOUNTER — Other Ambulatory Visit: Payer: Self-pay | Admitting: Internal Medicine

## 2023-03-07 DIAGNOSIS — I10 Essential (primary) hypertension: Secondary | ICD-10-CM

## 2023-03-07 DIAGNOSIS — R1310 Dysphagia, unspecified: Secondary | ICD-10-CM

## 2023-03-12 ENCOUNTER — Ambulatory Visit: Payer: Medicare Other

## 2023-03-12 DIAGNOSIS — M6281 Muscle weakness (generalized): Secondary | ICD-10-CM

## 2023-03-12 DIAGNOSIS — R278 Other lack of coordination: Secondary | ICD-10-CM

## 2023-03-12 DIAGNOSIS — R1319 Other dysphagia: Secondary | ICD-10-CM | POA: Diagnosis not present

## 2023-03-12 DIAGNOSIS — C159 Malignant neoplasm of esophagus, unspecified: Secondary | ICD-10-CM | POA: Diagnosis not present

## 2023-03-12 DIAGNOSIS — R269 Unspecified abnormalities of gait and mobility: Secondary | ICD-10-CM

## 2023-03-12 DIAGNOSIS — R262 Difficulty in walking, not elsewhere classified: Secondary | ICD-10-CM

## 2023-03-12 DIAGNOSIS — R2681 Unsteadiness on feet: Secondary | ICD-10-CM

## 2023-03-12 DIAGNOSIS — R2689 Other abnormalities of gait and mobility: Secondary | ICD-10-CM

## 2023-03-12 NOTE — Therapy (Addendum)
OUTPATIENT PHYSICAL THERAPY TREATMENT    Patient Name: [name]. MRN: [id number] DOB:November 08, 1955, 67 y.o., male Today's Date: 03/12/2023  END OF SESSION:  PT End of Session - 03/12/23 1303     Visit Number 25    Number of Visits 44    Date for PT Re-Evaluation 05/16/23    Authorization Type [employer] Medicare    Progress Note Due on Visit 30    PT Start Time 1315    PT Stop Time 1358    PT Time Calculation (min) 43 min    Equipment Utilized During Treatment Gait belt    Activity Tolerance Patient tolerated treatment well;No increased pain;Patient limited by fatigue    Behavior During Therapy [facility] for tasks assessed/performed                       Past Medical History:  Diagnosis Date   Arrhythmia    atrial fibrillation   Asthma    CHF (congestive heart failure) (HCC)    COPD (chronic obstructive pulmonary disease) (HCC)    Coronary artery disease    Depression    Diabetes mellitus without complication (HCC)    Gout    History anabolic steroid use    Hyperlipidemia    Hypertension    Hypogonadism in male    MI (myocardial infarction) (HCC)    Morbid obesity (HCC)    Myocardial infarction (HCC)    Peripheral vascular disease (HCC)    Perirectal abscess    Pleurisy    Sleep apnea    CPAP at night, no oxygen   Varicella    Past Surgical History:  Procedure Laterality Date   ABDOMINAL AORTIC ANEURYSM REPAIR     ACHILLES TENDON SURGERY Left 01/10/2021   Procedure: ACHILLES LENGTHENING/KIDNER;  Surgeon: Rosetta Posner, DPM;  Location: [facility];  Service: Podiatry;  Laterality: Left;   AMPUTATION Left 10/03/2021   Procedure: AMPUTATION BELOW KNEE;  Surgeon: Renford Dills, MD;  Location: [facility];  Service: Vascular;  Laterality: Left;   AMPUTATION Right 05/24/2022   Procedure: AMPUTATION BELOW KNEE;  Surgeon: Annice Needy, MD;  Location: [facility];  Service: General;  Laterality: Right;   AMPUTATION TOE Right 02/10/2016   Procedure:  AMPUTATION TOE 3RD TOE;  Surgeon: Gwyneth Revels, DPM;  Location: [facility];  Service: Podiatry;  Laterality: Right;   AMPUTATION TOE Left 02/24/2020   Procedure: AMPUTATION TOE;  Surgeon: Rosetta Posner, DPM;  Location: [facility];  Service: Podiatry;  Laterality: Left;   APPLICATION OF WOUND VAC Left 02/29/2020   Procedure: APPLICATION OF WOUND VAC;  Surgeon: Rosetta Posner, DPM;  Location: [facility];  Service: Podiatry;  Laterality: Left;   CARDIAC CATHETERIZATION     CARDIOVERSION [name] 02/13/2022   Procedure: CARDIOVERSION;  Surgeon: Lamar Blinks, MD;  Location: [facility];  Service: Cardiovascular;  Laterality: [name];   COLONOSCOPY WITH PROPOFOL [name] 11/18/2015   Procedure: COLONOSCOPY WITH PROPOFOL;  Surgeon: Scot Jun, MD;  Location: [facility];  Service: Endoscopy;  Laterality: [name];   CORONARY ARTERY BYPASS GRAFT     CORONARY STENT INTERVENTION [name] 02/02/2020   Procedure: CORONARY STENT INTERVENTION;  Surgeon: Marcina Millard, MD;  Location: [facility];  Service: Cardiovascular;  Laterality: [name];   INCISION AND DRAINAGE Left 08/07/2021   Procedure: INCISION AND DRAINAGE-Partial Calcanectomy;  Surgeon: Rosetta Posner, DPM;  Location: [facility];  Service: Podiatry;  Laterality: Left;   IRRIGATION AND DEBRIDEMENT FOOT Left 02/29/2020   Procedure:  IRRIGATION AND DEBRIDEMENT FOOT;  Surgeon: Rosetta Posner, DPM;  Location: [facility];  Service: Podiatry;  Laterality: Left;   IRRIGATION AND DEBRIDEMENT FOOT Left 02/24/2020   Procedure: IRRIGATION AND DEBRIDEMENT FOOT;  Surgeon: Rosetta Posner, DPM;  Location: [facility];  Service: Podiatry;  Laterality: Left;   KNEE ARTHROSCOPY     LEFT HEART CATH AND CORS/GRAFTS ANGIOGRAPHY [name] 02/02/2020   Procedure: LEFT HEART CATH AND CORS/GRAFTS ANGIOGRAPHY;  Surgeon: Dalia Heading, MD;  Location: [facility];  Service: Cardiovascular;  Laterality: [name];   LOWER EXTREMITY ANGIOGRAPHY Left 02/25/2020   Procedure: Lower Extremity  Angiography;  Surgeon: Annice Needy, MD;  Location: [facility];  Service: Cardiovascular;  Laterality: Left;   LOWER EXTREMITY ANGIOGRAPHY Left 01/04/2021   Procedure: LOWER EXTREMITY ANGIOGRAPHY;  Surgeon: Annice Needy, MD;  Location: [facility];  Service: Cardiovascular;  Laterality: Left;   LOWER EXTREMITY ANGIOGRAPHY Right 05/21/2022   Procedure: Lower Extremity Angiography;  Surgeon: Annice Needy, MD;  Location: [facility];  Service: Cardiovascular;  Laterality: Right;   METATARSAL HEAD EXCISION Left 01/10/2021   Procedure: METATARSAL HEAD EXCISION - LEFT 5th;  Surgeon: Rosetta Posner, DPM;  Location: [facility];  Service: Podiatry;  Laterality: Left;   PERIPHERAL VASCULAR CATHETERIZATION Right 01/24/2016   Procedure: Lower Extremity Angiography;  Surgeon: Renford Dills, MD;  Location: [facility];  Service: Cardiovascular;  Laterality: Right;   PERIPHERAL VASCULAR CATHETERIZATION Right 01/25/2016   Procedure: Lower Extremity Angiography;  Surgeon: Renford Dills, MD;  Location: [facility];  Service: Cardiovascular;  Laterality: Right;   TOE AMPUTATION     TONSILLECTOMY     Patient Active Problem List   Diagnosis Date Noted   Status post below-knee amputation of right lower extremity (HCC) 05/31/2022   S/P amputation 05/30/2022   Poorly controlled diabetes mellitus (HCC) 05/30/2022   Acute osteomyelitis of right foot (HCC) 05/19/2022   Wound infection 05/18/2022   Ulcer of right ankle, with fat layer exposed (HCC) 05/18/2022   Non-healing wound of lower extremity, initial encounter 05/18/2022   CAD with Hx of CABG 05/18/2022   Chronic respiratory failure with hypoxia (HCC) 05/18/2022   S/P BKA (below knee amputation), left (HCC) 05/18/2022   COPD (chronic obstructive pulmonary disease) (HCC) 05/18/2022   Perinephric hematoma 03/20/2022   Bacteremia due to Klebsiella pneumoniae 03/19/2022   Hyperglycemia 03/16/2022   Septic shock  (HCC) 03/16/2022   Sepsis secondary to UTI (HCC) 03/15/2022   Acute hypoxemic respiratory failure (HCC) 02/13/2022   Atrial fibrillation with rapid ventricular response (HCC) 02/11/2022   Uncontrolled type 2 diabetes mellitus with hyperglycemia, with long-term current use of insulin (HCC) 02/11/2022   Dyslipidemia 02/11/2022   Hypothyroidism 02/11/2022   Benign prostatic hyperplasia with lower urinary tract symptoms 02/11/2022   Pressure injury of skin of heel 11/04/2021   UTI (urinary tract infection) 10/22/2021   Iron deficiency anemia 10/19/2021   Hyperkalemia 10/14/2021   Acute urinary retention 10/08/2021   Sepsis due to methicillin resistant Staphylococcus aureus (MRSA) (HCC) 10/04/2021   Acute delirium 10/04/2021   Acute on chronic respiratory failure with hypoxia and hypercapnia (HCC) 10/04/2021   Acute respiratory failure with hypoxia and hypercapnia (HCC) 10/03/2021   OSA (obstructive sleep apnea) 10/03/2021   Malnutrition of moderate degree 09/29/2021   Generalized weakness 09/29/2021   Cellulitis of left foot 08/04/2021   Uncontrolled type 2 diabetes mellitus with hypoglycemia, with long-term current use of insulin (HCC) 08/04/2021  Bilateral lower extremity edema 08/04/2021   Acute osteomyelitis of lower leg, left (HCC) 08/04/2021   Swelling of limb 07/18/2021   CHF exacerbation (HCC) 05/08/2021   Diabetes mellitus without complication (HCC)    Atrial fibrillation with RVR (HCC)    Obesity, Class III, BMI 40-49.9 (morbid obesity) (HCC)    Venous stasis ulcer (HCC)    Sinus tachycardia    BPH (benign prostatic hyperplasia)    Chronic anticoagulation    Anasarca    Chronic diastolic CHF (congestive heart failure) (HCC)    Venous stasis of both lower extremities    AF (paroxysmal atrial fibrillation) (HCC)    Lower extremity cellulitis 01/03/2021   Chronic osteomyelitis of left foot (HCC) 01/03/2021   Osteomyelitis (HCC) 01/03/2021   Severe sepsis (HCC) 02/24/2020    AKI (acute kidney injury) (HCC) 02/24/2020   Bilateral cellulitis of lower leg 02/23/2020   CAD (coronary artery disease) 02/02/2020   RLS (restless legs syndrome) 05/15/2019   Statin myopathy 05/15/2019   Type 2 diabetes mellitus with obesity (HCC) 02/13/2016   Essential hypertension 02/13/2016   PAD (peripheral artery disease) (HCC) 02/13/2016   Pressure injury of skin 01/25/2016   Hypoglycemia associated with diabetes (HCC) 12/27/2013   Long-term insulin use (HCC) 12/27/2013   Microalbuminuria 12/27/2013   B12 deficiency 09/29/2013   Obesity 09/29/2013   CAD (coronary artery disease), autologous vein bypass graft 07/20/2013   Hypersomnia with sleep apnea 07/20/2013   Pure hypercholesterolemia 07/20/2013   Osteomyelitis of ankle or foot 07/20/2011    PCP: Dr. Aram Beecham  REFERRING PROVIDER: Sheppard Plumber, NP  REFERRING DIAG: [phone] (ICD-10-CM) - Status post below-knee amputation of right lower extremity (HCC)   THERAPY DIAG:  Difficulty in walking, not elsewhere classified  Muscle weakness (generalized)  Other abnormalities of gait and mobility  Other lack of coordination  Abnormality of gait and mobility  Unsteadiness on feet  Rationale for Evaluation and Treatment: Rehabilitation  ONSET DATE: 05/24/2022 (date of surgery for latest Below knee amputation- RLE)   SUBJECTIVE:   SUBJECTIVE STATEMENT: Pt reported 0/10 on NPS and stated that R prosthetic feels much better since prosthetist adjusted it last visit.     PERTINENT HISTORY: Patient is a 67 year old male with CAD/PAF on Eliquis followed by Cardiology.. Obese with BMI>38. HTN stable on meds. Has HLD not on statin due to myopathy. Also with DM on insulin and thyroid dz on Synthroid. (See above section for all PMH) Patient reports he is eager to be here and a long road to recovery- Reports having his left leg amputated last year (10/03/2021) and most recently his right LE - both below the knee amputations.  Reports very dependent with all mobility and states ready to get stronger and on his feet. States just received his prosthesis yesterday.   Patient using [facility] for prosthetic care PAIN:  Are you having pain? No  PRECAUTIONS: Fall  RED FLAGS: None   WEIGHT BEARING RESTRICTIONS: No  FALLS:  Has patient fallen in last 6 months?  Will ask next visit  LIVING ENVIRONMENT: Lives with: lives with their spouse Lives in: House/apartment Stairs:  split level home - 2 step down to den- ramp for entry in home.  Has following equipment at home: Quad cane large base, Environmental consultant - 2 wheeled, Wheelchair (manual), [employer], Ramped entry, and [employer], hoyer lift  OCCUPATION: Psychologist, occupational- retired  PLOF: Independent- prior to 10/03/2021 (left BKA)   PATIENT GOALS: "I want to walk with my prostheses"  NEXT MD VISIT: TBA  OBJECTIVE:   TODAY'S TREATMENT:                                                                                                                              DATE: 03/12/2023     TE Seated LAQ 10 reps each LE Seated Marches 10 reps each LE Seated Lateral Steps 10 reps each LE  Therapeutic Activities:    *All activities performed at edge of mat.  Static standing trials x 6 with +2 person assist  (max assist) 1 person on each side utilizing RW. Focused on standing as erect as possible- Most attempts continue to require max assist to stand and to lean forward. Demonstrated more anterior placed feet in relation to hips/upper trunk- placing him in more hyperextended position- making standing difficult.    HOME EXERCISE PROGRAM: Access Code: [email or web address] URL: [email or web address] Date: 12/20/2022 Prepared by: Maureen Ralphs  Exercises - Supine Quad Set (BKA)  - 1 x daily - 3 x weekly - 3 sets - 10 reps - Sidelying Hip Abduction wtih Flexion and Extension (BKA)  - 1 x daily - 3 x weekly - 3 sets - 10 reps - Supine Single Leg Bridge with Sound Leg (BKA)  - 1 x  daily - 3 x weekly - 3 sets - 10 reps - Supine Hip Extension with Towel Roll (BKA)  - 1 x daily - 3 x weekly - 3 sets - 10 reps - Hooklying Clamshell with Resistance  - 1 x daily - 3 x weekly - 3 sets - 10 reps    -determination of a schedule at home for wearing of prosthese and shrinkers; recommended on 3x daily for prostheses with ad lib increase in time. -discussed pros and cons of shrink use/disuse overnight   ASSESSMENT:  CLINICAL IMPRESSION: Pt was able to tolerate all tasks during today's visit. Pt was able to complete 6 trials of static standing at the edge of the mat with 2 person assist and use of a RW. Pt was able to stand for 2 minutes and 27 seconds on the last trial of static standing, which is improvement from previous visits. Pt will benefit from skilled PT services to improve his overall LE strength, transfer ability, and possibly able to return to walking with prosthetic training for improved mobility and quality of life.    OBJECTIVE IMPAIRMENTS: decreased activity tolerance, decreased balance, decreased endurance, difficulty walking, decreased strength, and obesity.   ACTIVITY LIMITATIONS: carrying, lifting, bending, standing, squatting, stairs, transfers, bed mobility, bathing, toileting, and dressing  PARTICIPATION LIMITATIONS: cleaning, driving, shopping, community activity, occupation, and yard work  PERSONAL FACTORS: Time since onset of injury/illness/exacerbation and 3+ comorbidities: DM, COPD, PAD  are also affecting patient's functional outcome.   REHAB POTENTIAL: Good  CLINICAL DECISION MAKING: Evolving/moderate complexity  EVALUATION COMPLEXITY: Moderate   GOALS: Goals reviewed with patient? Yes  SHORT TERM GOALS: Target date: 01/09/2023    1. Pt  will be independent with HEP in order to improve strength and balance in order to decrease fall risk and improve function at home    Baseline: No formal HEP in place;  02/21/2023-Patient verbalized good  understanding of HEP and practicing some with prosthesis donned and some with them off.    Goal status: ONGOING   LONG TERM GOALS: Target date: 02/20/2023   1.  Patient will demonstrate modified independent with all bed mobility (sup<-> sit)  for optimal function at home  Baseline: EVAL - Will assess 2nd visit; 01/08/2023- patient able to roll with assist of wife;  Goal status: MET  2.  Patient will increase FOTO score to equal to or greater than  52 to demonstrate statistically significant improvement in mobility and quality of life.  Baseline: EVAL = 41 Goal status: INITIAL   3.  Patient will demonstrate all slide board transfers with Min assist (Bed <-> chair)   Baseline: EVAL= Patient unable to transfer from chair to bed due to increased musle weakness; 01/08/2023- Patient continues to require hoyer lift for all transfers; 02/21/2023- no changes since 9/10 with continued use of both hoyer and slide board transfers at home. Goal status: ONGOING  4. Patient and wife will be independent with donning prostheses consistently able to get 2-3 clicks each LE for improved ability to don his legs to prepare for transfers/gait.    Baseline: 02/21/2023- Patient and wife are not able to consistently don prostheses - experiencing some difficulty at times.   Goal Status: New  5. Patient will demonstrate ability to stand > using walker and CGA only  for improved functional mobility and to prepare for pregait/and prosthetic gait training.     Baseline: 02/21/2023- Patient able to stand yet very inconsistent with times and amount of assist - min assist +2 to max    Assist.    Goal status: NEW    PLAN:  PT FREQUENCY: 1-2x/week  PT DURATION: 12 weeks  PLANNED INTERVENTIONS: Therapeutic exercises, Therapeutic activity, Neuromuscular re-education, Balance training, Gait training, Patient/Family education, Self Care, Joint mobilization, Joint manipulation, Vestibular training, Canalith  repositioning, Prosthetic training, DME instructions, Dry Needling, Electrical stimulation, Wheelchair mobility training, Spinal manipulation, Spinal mobilization, Cryotherapy, Moist heat, Compression bandaging, scar mobilization, Manual therapy, and Re-evaluation  PLAN FOR NEXT SESSION:  Continue to progress LE strengthening and therapeutic activities/prosthetic training.  Functional standing to prepare for transfers and potential walking in future.    4:48 PM, 03/12/23 Louis Meckel, PT Physical Therapist - [facility]  Outpatient Physical Therapy- Main [facility] [phone]    Debara Pickett, SPT   This entire session was performed under direct supervision and direction of a licensed therapist/therapist assistant . I have personally read, edited and approve of the note as written.   Debara Pickett, Student-PT 03/12/2023, 4:48 PM

## 2023-03-13 ENCOUNTER — Ambulatory Visit: Payer: Medicare Other

## 2023-03-13 DIAGNOSIS — R278 Other lack of coordination: Secondary | ICD-10-CM

## 2023-03-13 DIAGNOSIS — R2681 Unsteadiness on feet: Secondary | ICD-10-CM

## 2023-03-13 DIAGNOSIS — M6281 Muscle weakness (generalized): Secondary | ICD-10-CM

## 2023-03-13 DIAGNOSIS — R269 Unspecified abnormalities of gait and mobility: Secondary | ICD-10-CM

## 2023-03-13 DIAGNOSIS — R2689 Other abnormalities of gait and mobility: Secondary | ICD-10-CM

## 2023-03-13 DIAGNOSIS — R262 Difficulty in walking, not elsewhere classified: Secondary | ICD-10-CM

## 2023-03-13 NOTE — Therapy (Unsigned)
OUTPATIENT PHYSICAL THERAPY TREATMENT    Patient Name: [name]. MRN: [id number] DOB:1955/11/16, 67 y.o., male Today's Date: 03/13/2023  END OF SESSION:  PT End of Session - 03/13/23 1552     Visit Number 26    Number of Visits 44    Date for PT Re-Evaluation 05/16/23    Authorization Type [employer] Medicare    Progress Note Due on Visit 30    PT Start Time 1447    PT Stop Time 1528    PT Time Calculation (min) 41 min    Equipment Utilized During Treatment Gait belt    Activity Tolerance Patient tolerated treatment well;No increased pain;Patient limited by fatigue    Behavior During Therapy [facility] for tasks assessed/performed                       Past Medical History:  Diagnosis Date   Arrhythmia    atrial fibrillation   Asthma    CHF (congestive heart failure) (HCC)    COPD (chronic obstructive pulmonary disease) (HCC)    Coronary artery disease    Depression    Diabetes mellitus without complication (HCC)    Gout    History anabolic steroid use    Hyperlipidemia    Hypertension    Hypogonadism in male    MI (myocardial infarction) (HCC)    Morbid obesity (HCC)    Myocardial infarction (HCC)    Peripheral vascular disease (HCC)    Perirectal abscess    Pleurisy    Sleep apnea    CPAP at night, no oxygen   Varicella    Past Surgical History:  Procedure Laterality Date   ABDOMINAL AORTIC ANEURYSM REPAIR     ACHILLES TENDON SURGERY Left 01/10/2021   Procedure: ACHILLES LENGTHENING/KIDNER;  Surgeon: Rosetta Posner, DPM;  Location: [facility];  Service: Podiatry;  Laterality: Left;   AMPUTATION Left 10/03/2021   Procedure: AMPUTATION BELOW KNEE;  Surgeon: Renford Dills, MD;  Location: [facility];  Service: Vascular;  Laterality: Left;   AMPUTATION Right 05/24/2022   Procedure: AMPUTATION BELOW KNEE;  Surgeon: Annice Needy, MD;  Location: [facility];  Service: General;  Laterality: Right;   AMPUTATION TOE Right 02/10/2016   Procedure:  AMPUTATION TOE 3RD TOE;  Surgeon: Gwyneth Revels, DPM;  Location: [facility];  Service: Podiatry;  Laterality: Right;   AMPUTATION TOE Left 02/24/2020   Procedure: AMPUTATION TOE;  Surgeon: Rosetta Posner, DPM;  Location: [facility];  Service: Podiatry;  Laterality: Left;   APPLICATION OF WOUND VAC Left 02/29/2020   Procedure: APPLICATION OF WOUND VAC;  Surgeon: Rosetta Posner, DPM;  Location: [facility];  Service: Podiatry;  Laterality: Left;   CARDIAC CATHETERIZATION     CARDIOVERSION [name] 02/13/2022   Procedure: CARDIOVERSION;  Surgeon: Lamar Blinks, MD;  Location: [facility];  Service: Cardiovascular;  Laterality: [name];   COLONOSCOPY WITH PROPOFOL [name] 11/18/2015   Procedure: COLONOSCOPY WITH PROPOFOL;  Surgeon: Scot Jun, MD;  Location: [name];  Service: Endoscopy;  Laterality: [name];   CORONARY ARTERY BYPASS GRAFT     CORONARY STENT INTERVENTION [name] 02/02/2020   Procedure: CORONARY STENT INTERVENTION;  Surgeon: Marcina Millard, MD;  Location: [facility];  Service: Cardiovascular;  Laterality: [name];   INCISION AND DRAINAGE Left 08/07/2021   Procedure: INCISION AND DRAINAGE-Partial Calcanectomy;  Surgeon: Rosetta Posner, DPM;  Location: [facility];  Service: Podiatry;  Laterality: Left;   IRRIGATION AND DEBRIDEMENT FOOT Left 02/29/2020   Procedure:  IRRIGATION AND DEBRIDEMENT FOOT;  Surgeon: Rosetta Posner, DPM;  Location: [facility];  Service: Podiatry;  Laterality: Left;   IRRIGATION AND DEBRIDEMENT FOOT Left 02/24/2020   Procedure: IRRIGATION AND DEBRIDEMENT FOOT;  Surgeon: Rosetta Posner, DPM;  Location: [facility];  Service: Podiatry;  Laterality: Left;   KNEE ARTHROSCOPY     LEFT HEART CATH AND CORS/GRAFTS ANGIOGRAPHY [name] 02/02/2020   Procedure: LEFT HEART CATH AND CORS/GRAFTS ANGIOGRAPHY;  Surgeon: Dalia Heading, MD;  Location: [facility];  Service: Cardiovascular;  Laterality: [name];   LOWER EXTREMITY ANGIOGRAPHY Left 02/25/2020   Procedure: Lower Extremity  Angiography;  Surgeon: Annice Needy, MD;  Location: [facility];  Service: Cardiovascular;  Laterality: Left;   LOWER EXTREMITY ANGIOGRAPHY Left 01/04/2021   Procedure: LOWER EXTREMITY ANGIOGRAPHY;  Surgeon: Annice Needy, MD;  Location: [facility];  Service: Cardiovascular;  Laterality: Left;   LOWER EXTREMITY ANGIOGRAPHY Right 05/21/2022   Procedure: Lower Extremity Angiography;  Surgeon: Annice Needy, MD;  Location: [facility];  Service: Cardiovascular;  Laterality: Right;   METATARSAL HEAD EXCISION Left 01/10/2021   Procedure: METATARSAL HEAD EXCISION - LEFT 5th;  Surgeon: Rosetta Posner, DPM;  Location: [facility];  Service: Podiatry;  Laterality: Left;   PERIPHERAL VASCULAR CATHETERIZATION Right 01/24/2016   Procedure: Lower Extremity Angiography;  Surgeon: Renford Dills, MD;  Location: [facility];  Service: Cardiovascular;  Laterality: Right;   PERIPHERAL VASCULAR CATHETERIZATION Right 01/25/2016   Procedure: Lower Extremity Angiography;  Surgeon: Renford Dills, MD;  Location: [facility];  Service: Cardiovascular;  Laterality: Right;   TOE AMPUTATION     TONSILLECTOMY     Patient Active Problem List   Diagnosis Date Noted   Status post below-knee amputation of right lower extremity (HCC) 05/31/2022   S/P amputation 05/30/2022   Poorly controlled diabetes mellitus (HCC) 05/30/2022   Acute osteomyelitis of right foot (HCC) 05/19/2022   Wound infection 05/18/2022   Ulcer of right ankle, with fat layer exposed (HCC) 05/18/2022   Non-healing wound of lower extremity, initial encounter 05/18/2022   CAD with Hx of CABG 05/18/2022   Chronic respiratory failure with hypoxia (HCC) 05/18/2022   S/P BKA (below knee amputation), left (HCC) 05/18/2022   COPD (chronic obstructive pulmonary disease) (HCC) 05/18/2022   Perinephric hematoma 03/20/2022   Bacteremia due to Klebsiella pneumoniae 03/19/2022   Hyperglycemia 03/16/2022   Septic shock  (HCC) 03/16/2022   Sepsis secondary to UTI (HCC) 03/15/2022   Acute hypoxemic respiratory failure (HCC) 02/13/2022   Atrial fibrillation with rapid ventricular response (HCC) 02/11/2022   Uncontrolled type 2 diabetes mellitus with hyperglycemia, with long-term current use of insulin (HCC) 02/11/2022   Dyslipidemia 02/11/2022   Hypothyroidism 02/11/2022   Benign prostatic hyperplasia with lower urinary tract symptoms 02/11/2022   Pressure injury of skin of heel 11/04/2021   UTI (urinary tract infection) 10/22/2021   Iron deficiency anemia 10/19/2021   Hyperkalemia 10/14/2021   Acute urinary retention 10/08/2021   Sepsis due to methicillin resistant Staphylococcus aureus (MRSA) (HCC) 10/04/2021   Acute delirium 10/04/2021   Acute on chronic respiratory failure with hypoxia and hypercapnia (HCC) 10/04/2021   Acute respiratory failure with hypoxia and hypercapnia (HCC) 10/03/2021   OSA (obstructive sleep apnea) 10/03/2021   Malnutrition of moderate degree 09/29/2021   Generalized weakness 09/29/2021   Cellulitis of left foot 08/04/2021   Uncontrolled type 2 diabetes mellitus with hypoglycemia, with long-term current use of insulin (HCC) 08/04/2021  Bilateral lower extremity edema 08/04/2021   Acute osteomyelitis of lower leg, left (HCC) 08/04/2021   Swelling of limb 07/18/2021   CHF exacerbation (HCC) 05/08/2021   Diabetes mellitus without complication (HCC)    Atrial fibrillation with RVR (HCC)    Obesity, Class III, BMI 40-49.9 (morbid obesity) (HCC)    Venous stasis ulcer (HCC)    Sinus tachycardia    BPH (benign prostatic hyperplasia)    Chronic anticoagulation    Anasarca    Chronic diastolic CHF (congestive heart failure) (HCC)    Venous stasis of both lower extremities    AF (paroxysmal atrial fibrillation) (HCC)    Lower extremity cellulitis 01/03/2021   Chronic osteomyelitis of left foot (HCC) 01/03/2021   Osteomyelitis (HCC) 01/03/2021   Severe sepsis (HCC) 02/24/2020    AKI (acute kidney injury) (HCC) 02/24/2020   Bilateral cellulitis of lower leg 02/23/2020   CAD (coronary artery disease) 02/02/2020   RLS (restless legs syndrome) 05/15/2019   Statin myopathy 05/15/2019   Type 2 diabetes mellitus with obesity (HCC) 02/13/2016   Essential hypertension 02/13/2016   PAD (peripheral artery disease) (HCC) 02/13/2016   Pressure injury of skin 01/25/2016   Hypoglycemia associated with diabetes (HCC) 12/27/2013   Long-term insulin use (HCC) 12/27/2013   Microalbuminuria 12/27/2013   B12 deficiency 09/29/2013   Obesity 09/29/2013   CAD (coronary artery disease), autologous vein bypass graft 07/20/2013   Hypersomnia with sleep apnea 07/20/2013   Pure hypercholesterolemia 07/20/2013   Osteomyelitis of ankle or foot 07/20/2011    PCP: Dr. Aram Beecham  REFERRING PROVIDER: Sheppard Plumber, NP  REFERRING DIAG: [phone] (ICD-10-CM) - Status post below-knee amputation of right lower extremity (HCC)   THERAPY DIAG:  Difficulty in walking, not elsewhere classified  Muscle weakness (generalized)  Other abnormalities of gait and mobility  Other lack of coordination  Abnormality of gait and mobility  Unsteadiness on feet  Rationale for Evaluation and Treatment: Rehabilitation  ONSET DATE: 05/24/2022 (date of surgery for latest Below knee amputation- RLE)   SUBJECTIVE:   SUBJECTIVE STATEMENT: Pt reported 0/10 on NPS and that he has a swallow study scheduled for tomorrow to address issues with swallowing foods and liquids.     PERTINENT HISTORY: Patient is a 67 year old male with CAD/PAF on Eliquis followed by Cardiology.. Obese with BMI>38. HTN stable on meds. Has HLD not on statin due to myopathy. Also with DM on insulin and thyroid dz on Synthroid. (See above section for all PMH) Patient reports he is eager to be here and a long road to recovery- Reports having his left leg amputated last year (10/03/2021) and most recently his right LE - both below the  knee amputations. Reports very dependent with all mobility and states ready to get stronger and on his feet. States just received his prosthesis yesterday.   Patient using [facility] for prosthetic care PAIN:  Are you having pain? No  PRECAUTIONS: Fall  RED FLAGS: None   WEIGHT BEARING RESTRICTIONS: No  FALLS:  Has patient fallen in last 6 months?  Will ask next visit  LIVING ENVIRONMENT: Lives with: lives with their spouse Lives in: House/apartment Stairs:  split level home - 2 step down to den- ramp for entry in home.  Has following equipment at home: Quad cane large base, Environmental consultant - 2 wheeled, Wheelchair (manual), [employer], Ramped entry, and [employer], hoyer lift  OCCUPATION: Psychologist, occupational- retired  PLOF: Independent- prior to 10/03/2021 (left BKA)   PATIENT GOALS: "I want to walk  with my prostheses"  NEXT MD VISIT: TBA  OBJECTIVE:   TODAY'S TREATMENT:                                                                                                                              DATE: 03/13/2023     TE Seated LAQ 20 reps each LE Seated Marches 20 reps each LE  Therapeutic Activities:    *All activities performed at edge of mat.  Static standing trials x 4 with +2 person assist  (max assist) 1 person on each side utilizing RW. Focused on standing as erect as possible- first 2 trials were timed for 2 minutes and 3 minutes and 30 seconds, respectively. Third and fourth trials involved moving the RW back and forth and static marches as tolerated.     HOME EXERCISE PROGRAM: Access Code: [email or web address] URL: [email or web address] Date: 12/20/2022 Prepared by: Maureen Ralphs  Exercises - Supine Quad Set (BKA)  - 1 x daily - 3 x weekly - 3 sets - 10 reps - Sidelying Hip Abduction wtih Flexion and Extension (BKA)  - 1 x daily - 3 x weekly - 3 sets - 10 reps - Supine Single Leg Bridge with Sound Leg (BKA)  - 1 x daily - 3 x weekly - 3 sets - 10 reps - Supine Hip  Extension with Towel Roll (BKA)  - 1 x daily - 3 x weekly - 3 sets - 10 reps - Hooklying Clamshell with Resistance  - 1 x daily - 3 x weekly - 3 sets - 10 reps    -determination of a schedule at home for wearing of prosthese and shrinkers; recommended on 3x daily for prostheses with ad lib increase in time. -discussed pros and cons of shrink use/disuse overnight   ASSESSMENT:  CLINICAL IMPRESSION: Pt was able to tolerate all tasks during today's visit. Pt was able to static stand for three and a half minutes, which was an improvement from last session. During the last two trials of static standing, the pt was able to move the RW back and forth for the first time. These are both great milestones to progress towards the ultimate goal of ambulation. Pt will benefit from skilled PT services to improve his overall LE strength, transfer ability, and possibly able to return to walking with prosthetic training for improved mobility and quality of life.    OBJECTIVE IMPAIRMENTS: decreased activity tolerance, decreased balance, decreased endurance, difficulty walking, decreased strength, and obesity.   ACTIVITY LIMITATIONS: carrying, lifting, bending, standing, squatting, stairs, transfers, bed mobility, bathing, toileting, and dressing  PARTICIPATION LIMITATIONS: cleaning, driving, shopping, community activity, occupation, and yard work  PERSONAL FACTORS: Time since onset of injury/illness/exacerbation and 3+ comorbidities: DM, COPD, PAD  are also affecting patient's functional outcome.   REHAB POTENTIAL: Good  CLINICAL DECISION MAKING: Evolving/moderate complexity  EVALUATION COMPLEXITY: Moderate   GOALS: Goals reviewed with patient? Yes  SHORT TERM GOALS: Target date: 01/09/2023  1. Pt will be independent with HEP in order to improve strength and balance in order to decrease fall risk and improve function at home    Baseline: No formal HEP in place;  02/21/2023-Patient verbalized  good understanding of HEP and practicing some with prosthesis donned and some with them off.    Goal status: ONGOING   LONG TERM GOALS: Target date: 02/20/2023   1.  Patient will demonstrate modified independent with all bed mobility (sup<-> sit)  for optimal function at home  Baseline: EVAL - Will assess 2nd visit; 01/08/2023- patient able to roll with assist of wife;  Goal status: MET  2.  Patient will increase FOTO score to equal to or greater than  52 to demonstrate statistically significant improvement in mobility and quality of life.  Baseline: EVAL = 41 Goal status: INITIAL   3.  Patient will demonstrate all slide board transfers with Min assist (Bed <-> chair)   Baseline: EVAL= Patient unable to transfer from chair to bed due to increased musle weakness; 01/08/2023- Patient continues to require hoyer lift for all transfers; 02/21/2023- no changes since 9/10 with continued use of both hoyer and slide board transfers at home. Goal status: ONGOING  4. Patient and wife will be independent with donning prostheses consistently able to get 2-3 clicks each LE for improved ability to don his legs to prepare for transfers/gait.    Baseline: 02/21/2023- Patient and wife are not able to consistently don prostheses - experiencing some difficulty at times.   Goal Status: New  5. Patient will demonstrate ability to stand > using walker and CGA only  for improved functional mobility and to prepare for pregait/and prosthetic gait training.     Baseline: 02/21/2023- Patient able to stand yet very inconsistent with times and amount of assist - min assist +2 to max    Assist.    Goal status: NEW    PLAN:  PT FREQUENCY: 1-2x/week  PT DURATION: 12 weeks  PLANNED INTERVENTIONS: Therapeutic exercises, Therapeutic activity, Neuromuscular re-education, Balance training, Gait training, Patient/Family education, Self Care, Joint mobilization, Joint manipulation, Vestibular training, Canalith  repositioning, Prosthetic training, DME instructions, Dry Needling, Electrical stimulation, Wheelchair mobility training, Spinal manipulation, Spinal mobilization, Cryotherapy, Moist heat, Compression bandaging, scar mobilization, Manual therapy, and Re-evaluation  PLAN FOR NEXT SESSION:  Continue to progress LE strengthening and therapeutic activities/prosthetic training.  Functional standing to prepare for transfers and potential walking in future.    Debara Pickett, SPT   This entire session was performed under direct supervision and direction of a licensed Estate agent . I have personally read, edited and approve of the note as written.       3:53 PM, 03/13/23 Louis Meckel, PT Physical Therapist - [facility]  Outpatient Physical Therapy- Main Campus [phone]

## 2023-03-14 ENCOUNTER — Inpatient Hospital Stay
Admission: EM | Admit: 2023-03-14 | Discharge: 2023-03-22 | DRG: 357 | Disposition: A | Discharge status: Home Health | Payer: Medicare Other | Attending: Internal Medicine | Admitting: Internal Medicine

## 2023-03-14 ENCOUNTER — Ambulatory Visit
Admission: RE | Admit: 2023-03-14 | Discharge: 2023-03-14 | Disposition: A | Discharge status: Home / Self Care | Payer: Medicare Other | Source: Ambulatory Visit | Attending: Internal Medicine | Admitting: Internal Medicine

## 2023-03-14 DIAGNOSIS — W44F3XA Food entering into or through a natural orifice, initial encounter: Secondary | ICD-10-CM | POA: Diagnosis present

## 2023-03-14 DIAGNOSIS — E1165 Type 2 diabetes mellitus with hyperglycemia: Secondary | ICD-10-CM | POA: Diagnosis present

## 2023-03-14 DIAGNOSIS — J4489 Other specified chronic obstructive pulmonary disease: Secondary | ICD-10-CM | POA: Diagnosis present

## 2023-03-14 DIAGNOSIS — Z89511 Acquired absence of right leg below knee: Secondary | ICD-10-CM | POA: Diagnosis not present

## 2023-03-14 DIAGNOSIS — Z888 Allergy status to other drugs, medicaments and biological substances status: Secondary | ICD-10-CM

## 2023-03-14 DIAGNOSIS — T18128A Food in esophagus causing other injury, initial encounter: Secondary | ICD-10-CM | POA: Diagnosis present

## 2023-03-14 DIAGNOSIS — R1310 Dysphagia, unspecified: Secondary | ICD-10-CM | POA: Insufficient documentation

## 2023-03-14 DIAGNOSIS — E1142 Type 2 diabetes mellitus with diabetic polyneuropathy: Secondary | ICD-10-CM | POA: Diagnosis present

## 2023-03-14 DIAGNOSIS — Z89512 Acquired absence of left leg below knee: Secondary | ICD-10-CM | POA: Diagnosis not present

## 2023-03-14 DIAGNOSIS — F17290 Nicotine dependence, other tobacco product, uncomplicated: Secondary | ICD-10-CM | POA: Diagnosis present

## 2023-03-14 DIAGNOSIS — I4892 Unspecified atrial flutter: Secondary | ICD-10-CM | POA: Diagnosis not present

## 2023-03-14 DIAGNOSIS — Z800 Family history of malignant neoplasm of digestive organs: Secondary | ICD-10-CM

## 2023-03-14 DIAGNOSIS — Z6836 Body mass index (BMI) 36.0-36.9, adult: Secondary | ICD-10-CM

## 2023-03-14 DIAGNOSIS — I959 Hypotension, unspecified: Secondary | ICD-10-CM | POA: Diagnosis not present

## 2023-03-14 DIAGNOSIS — I482 Chronic atrial fibrillation, unspecified: Secondary | ICD-10-CM | POA: Diagnosis present

## 2023-03-14 DIAGNOSIS — I10 Essential (primary) hypertension: Secondary | ICD-10-CM | POA: Insufficient documentation

## 2023-03-14 DIAGNOSIS — M109 Gout, unspecified: Secondary | ICD-10-CM | POA: Diagnosis present

## 2023-03-14 DIAGNOSIS — N179 Acute kidney failure, unspecified: Secondary | ICD-10-CM | POA: Diagnosis present

## 2023-03-14 DIAGNOSIS — C159 Malignant neoplasm of esophagus, unspecified: Secondary | ICD-10-CM | POA: Diagnosis present

## 2023-03-14 DIAGNOSIS — K222 Esophageal obstruction: Secondary | ICD-10-CM | POA: Diagnosis present

## 2023-03-14 DIAGNOSIS — G4733 Obstructive sleep apnea (adult) (pediatric): Secondary | ICD-10-CM | POA: Diagnosis present

## 2023-03-14 DIAGNOSIS — I252 Old myocardial infarction: Secondary | ICD-10-CM

## 2023-03-14 DIAGNOSIS — Z79890 Hormone replacement therapy (postmenopausal): Secondary | ICD-10-CM

## 2023-03-14 DIAGNOSIS — E039 Hypothyroidism, unspecified: Secondary | ICD-10-CM | POA: Diagnosis present

## 2023-03-14 DIAGNOSIS — R1319 Other dysphagia: Principal | ICD-10-CM

## 2023-03-14 DIAGNOSIS — F32A Depression, unspecified: Secondary | ICD-10-CM | POA: Diagnosis present

## 2023-03-14 DIAGNOSIS — E1122 Type 2 diabetes mellitus with diabetic chronic kidney disease: Secondary | ICD-10-CM | POA: Diagnosis present

## 2023-03-14 DIAGNOSIS — I480 Paroxysmal atrial fibrillation: Secondary | ICD-10-CM | POA: Diagnosis present

## 2023-03-14 DIAGNOSIS — Z79899 Other long term (current) drug therapy: Secondary | ICD-10-CM

## 2023-03-14 DIAGNOSIS — N400 Enlarged prostate without lower urinary tract symptoms: Secondary | ICD-10-CM | POA: Diagnosis present

## 2023-03-14 DIAGNOSIS — E1151 Type 2 diabetes mellitus with diabetic peripheral angiopathy without gangrene: Secondary | ICD-10-CM | POA: Diagnosis present

## 2023-03-14 DIAGNOSIS — Z7901 Long term (current) use of anticoagulants: Secondary | ICD-10-CM

## 2023-03-14 DIAGNOSIS — I739 Peripheral vascular disease, unspecified: Secondary | ICD-10-CM | POA: Diagnosis present

## 2023-03-14 DIAGNOSIS — E785 Hyperlipidemia, unspecified: Secondary | ICD-10-CM | POA: Diagnosis present

## 2023-03-14 DIAGNOSIS — Z955 Presence of coronary angioplasty implant and graft: Secondary | ICD-10-CM

## 2023-03-14 DIAGNOSIS — I5032 Chronic diastolic (congestive) heart failure: Secondary | ICD-10-CM | POA: Diagnosis present

## 2023-03-14 DIAGNOSIS — K2289 Other specified disease of esophagus: Secondary | ICD-10-CM

## 2023-03-14 DIAGNOSIS — Z794 Long term (current) use of insulin: Secondary | ICD-10-CM

## 2023-03-14 DIAGNOSIS — Z7982 Long term (current) use of aspirin: Secondary | ICD-10-CM

## 2023-03-14 DIAGNOSIS — I110 Hypertensive heart disease with heart failure: Secondary | ICD-10-CM | POA: Diagnosis present

## 2023-03-14 DIAGNOSIS — I2510 Atherosclerotic heart disease of native coronary artery without angina pectoris: Secondary | ICD-10-CM | POA: Diagnosis present

## 2023-03-14 DIAGNOSIS — Z951 Presence of aortocoronary bypass graft: Secondary | ICD-10-CM

## 2023-03-14 DIAGNOSIS — Z811 Family history of alcohol abuse and dependence: Secondary | ICD-10-CM

## 2023-03-14 DIAGNOSIS — K449 Diaphragmatic hernia without obstruction or gangrene: Secondary | ICD-10-CM | POA: Diagnosis present

## 2023-03-14 DIAGNOSIS — Z7951 Long term (current) use of inhaled steroids: Secondary | ICD-10-CM

## 2023-03-14 DIAGNOSIS — Z01810 Encounter for preprocedural cardiovascular examination: Secondary | ICD-10-CM | POA: Diagnosis not present

## 2023-03-14 DIAGNOSIS — C155 Malignant neoplasm of lower third of esophagus: Secondary | ICD-10-CM | POA: Diagnosis not present

## 2023-03-14 DIAGNOSIS — Z8249 Family history of ischemic heart disease and other diseases of the circulatory system: Secondary | ICD-10-CM

## 2023-03-14 DIAGNOSIS — R1314 Dysphagia, pharyngoesophageal phase: Secondary | ICD-10-CM | POA: Diagnosis present

## 2023-03-14 DIAGNOSIS — Z9181 History of falling: Secondary | ICD-10-CM

## 2023-03-14 DIAGNOSIS — Z880 Allergy status to penicillin: Secondary | ICD-10-CM

## 2023-03-14 LAB — CBC WITH DIFFERENTIAL/PLATELET
Abs Immature Granulocytes: 0.01 10*3/uL (ref 0.00–0.07)
Basophils Absolute: 0.1 10*3/uL (ref 0.0–0.1)
Basophils Relative: 1 %
Eosinophils Absolute: 0.3 10*3/uL (ref 0.0–0.5)
Eosinophils Relative: 4 %
HCT: 42.4 % (ref 39.0–52.0)
Hemoglobin: 13 g/dL (ref 13.0–17.0)
Immature Granulocytes: 0 %
Lymphocytes Relative: 24 %
Lymphs Abs: 1.7 10*3/uL (ref 0.7–4.0)
MCH: 29.3 pg (ref 26.0–34.0)
MCHC: 30.7 g/dL (ref 30.0–36.0)
MCV: 95.7 fL (ref 80.0–100.0)
Monocytes Absolute: 0.5 10*3/uL (ref 0.1–1.0)
Monocytes Relative: 7 %
Neutro Abs: 4.7 10*3/uL (ref 1.7–7.7)
Neutrophils Relative %: 64 %
Platelets: 170 10*3/uL (ref 150–400)
RBC: 4.43 MIL/uL (ref 4.22–5.81)
RDW: 14.2 % (ref 11.5–15.5)
WBC: 7.3 10*3/uL (ref 4.0–10.5)
nRBC: 0 % (ref 0.0–0.2)

## 2023-03-14 LAB — COMPREHENSIVE METABOLIC PANEL
ALT: 19 U/L (ref 0–44)
AST: 15 U/L (ref 15–41)
Albumin: 3.7 g/dL (ref 3.5–5.0)
Alkaline Phosphatase: 122 U/L (ref 38–126)
Anion gap: 9 (ref 5–15)
BUN: 25 mg/dL — ABNORMAL HIGH (ref 8–23)
CO2: 30 mmol/L (ref 22–32)
Calcium: 8.9 mg/dL (ref 8.9–10.3)
Chloride: 98 mmol/L (ref 98–111)
Creatinine, Ser: 1.27 mg/dL — ABNORMAL HIGH (ref 0.61–1.24)
GFR, Estimated: >60 mL/min (ref >60)
Glucose, Bld: 193 mg/dL — ABNORMAL HIGH (ref 70–99)
Potassium: 4.4 mmol/L (ref 3.5–5.1)
Sodium: 137 mmol/L (ref 135–145)
Total Bilirubin: 0.4 mg/dL (ref <1.2)
Total Protein: 7.3 g/dL (ref 6.5–8.1)

## 2023-03-14 MED ORDER — APIXABAN 5 MG PO TABS
5.0000 mg | ORAL_TABLET | Freq: Two times a day (BID) | ORAL | Status: DC
  Administered 2023-03-15: 5 mg via ORAL
  Filled 2023-03-14: qty 1

## 2023-03-14 MED ORDER — FINASTERIDE 5 MG PO TABS
5.0000 mg | ORAL_TABLET | Freq: Every day | ORAL | Status: DC
  Filled 2023-03-14 (×2): qty 1

## 2023-03-14 MED ORDER — ONDANSETRON HCL 4 MG PO TABS: 4.0000 mg | ORAL_TABLET | Freq: Four times a day (QID) | ORAL | Status: DC | PRN

## 2023-03-14 MED ORDER — JUVEN PO PACK: 1.0000 | PACK | Freq: Two times a day (BID) | ORAL | Status: DC

## 2023-03-14 MED ORDER — PRIMIDONE 250 MG PO TABS
250.0000 mg | ORAL_TABLET | Freq: Two times a day (BID) | ORAL | Status: DC
  Administered 2023-03-15 – 2023-03-19 (×2): 250 mg via ORAL
  Filled 2023-03-14 (×10): qty 1

## 2023-03-14 MED ORDER — DILTIAZEM HCL ER COATED BEADS 300 MG PO CP24
300.0000 mg | ORAL_CAPSULE | Freq: Every day | ORAL | Status: DC
  Filled 2023-03-14 (×5): qty 1

## 2023-03-14 MED ORDER — ACETAMINOPHEN 325 MG PO TABS: 650.0000 mg | ORAL_TABLET | Freq: Four times a day (QID) | ORAL | Status: DC | PRN

## 2023-03-14 MED ORDER — EZETIMIBE 10 MG PO TABS
10.0000 mg | ORAL_TABLET | Freq: Every day | ORAL | Status: DC
  Filled 2023-03-14 (×5): qty 1

## 2023-03-14 MED ORDER — VITAMIN C 500 MG PO TABS
500.0000 mg | ORAL_TABLET | Freq: Every day | ORAL | Status: DC
  Filled 2023-03-14 (×2): qty 1

## 2023-03-14 MED ORDER — NITROGLYCERIN 0.4 MG SL SUBL: 0.4000 mg | SUBLINGUAL_TABLET | SUBLINGUAL | Status: DC | PRN

## 2023-03-14 MED ORDER — ISOSORBIDE MONONITRATE ER 30 MG PO TB24
60.0000 mg | ORAL_TABLET | Freq: Every day | ORAL | Status: DC
  Filled 2023-03-14 (×2): qty 2

## 2023-03-14 MED ORDER — GABAPENTIN 300 MG PO CAPS
300.0000 mg | ORAL_CAPSULE | Freq: Every day | ORAL | Status: DC
  Administered 2023-03-15: 300 mg via ORAL
  Filled 2023-03-14 (×3): qty 1

## 2023-03-14 MED ORDER — RISPERIDONE 0.25 MG PO TABS: 0.2500 mg | ORAL_TABLET | Freq: Two times a day (BID) | ORAL | Status: DC

## 2023-03-14 MED ORDER — VITAMIN D 25 MCG (1000 UNIT) PO TABS
1000.0000 [IU] | ORAL_TABLET | Freq: Every day | ORAL | Status: DC
  Filled 2023-03-14 (×2): qty 1

## 2023-03-14 MED ORDER — INSULIN GLARGINE-YFGN 100 UNIT/ML ~~LOC~~ SOLN
40.0000 [IU] | Freq: Every day | SUBCUTANEOUS | Status: DC
  Administered 2023-03-15: 40 [IU] via SUBCUTANEOUS
  Filled 2023-03-14 (×3): qty 0.4

## 2023-03-14 MED ORDER — ONDANSETRON HCL 4 MG/2ML IJ SOLN
4.0000 mg | Freq: Four times a day (QID) | INTRAMUSCULAR | Status: DC | PRN
  Administered 2023-03-18: 4 mg via INTRAVENOUS

## 2023-03-14 MED ORDER — MAGNESIUM HYDROXIDE 400 MG/5ML PO SUSP: 30.0000 mL | Freq: Every day | ORAL | Status: DC | PRN

## 2023-03-14 MED ORDER — TAMSULOSIN HCL 0.4 MG PO CAPS
0.4000 mg | ORAL_CAPSULE | Freq: Every day | ORAL | Status: DC
  Filled 2023-03-14: qty 1

## 2023-03-14 MED ORDER — FUROSEMIDE 20 MG PO TABS: 20.0000 mg | ORAL_TABLET | Freq: Two times a day (BID) | ORAL | Status: DC | PRN

## 2023-03-14 MED ORDER — ZINC GLUCONATE 50 MG PO TABS: 50.0000 mg | ORAL_TABLET | Freq: Every day | ORAL | Status: DC

## 2023-03-14 MED ORDER — AMIODARONE HCL 200 MG PO TABS
200.0000 mg | ORAL_TABLET | Freq: Every day | ORAL | Status: DC
  Filled 2023-03-14 (×2): qty 1

## 2023-03-14 MED ORDER — ADULT MULTIVITAMIN W/MINERALS CH
1.0000 | ORAL_TABLET | Freq: Every day | ORAL | Status: DC
  Administered 2023-03-19 – 2023-03-22 (×4): 1 via ORAL
  Filled 2023-03-14 (×5): qty 1

## 2023-03-14 MED ORDER — LEVOTHYROXINE SODIUM 25 MCG PO TABS: 25.0000 ug | ORAL_TABLET | Freq: Every day | ORAL | Status: DC

## 2023-03-14 MED ORDER — BUDESONIDE 0.5 MG/2ML IN SUSP
0.5000 mg | Freq: Two times a day (BID) | RESPIRATORY_TRACT | Status: DC
  Administered 2023-03-15 – 2023-03-16 (×3): 0.5 mg via RESPIRATORY_TRACT
  Filled 2023-03-14 (×4): qty 2

## 2023-03-14 MED ORDER — PRAMIPEXOLE DIHYDROCHLORIDE 1 MG PO TABS
2.0000 mg | ORAL_TABLET | Freq: Every day | ORAL | Status: DC
  Administered 2023-03-15: 2 mg via ORAL
  Filled 2023-03-14 (×5): qty 2

## 2023-03-14 MED ORDER — MUPIROCIN 2 % EX OINT: 1.0000 | TOPICAL_OINTMENT | Freq: Every day | CUTANEOUS | Status: DC

## 2023-03-14 MED ORDER — NYSTATIN 100000 UNIT/GM EX POWD
1.0000 | Freq: Two times a day (BID) | CUTANEOUS | Status: DC
Start: 2023-03-14 — End: 2023-03-22
  Administered 2023-03-15 – 2023-03-22 (×14): 1 via TOPICAL
  Filled 2023-03-14 (×2): qty 15

## 2023-03-14 MED ORDER — ZINC SULFATE 220 (50 ZN) MG PO CAPS
220.0000 mg | ORAL_CAPSULE | Freq: Every day | ORAL | Status: DC
  Administered 2023-03-19: 220 mg via ORAL
  Filled 2023-03-14 (×2): qty 1

## 2023-03-14 MED ORDER — VITAMIN B-12 1000 MCG PO TABS
5000.0000 ug | ORAL_TABLET | Freq: Every day | ORAL | Status: DC
  Administered 2023-03-19: 5000 ug via ORAL
  Filled 2023-03-14 (×2): qty 5

## 2023-03-14 MED ORDER — SODIUM CHLORIDE 0.9 % IV SOLN: INTRAVENOUS | Status: AC

## 2023-03-14 MED ORDER — ACETAMINOPHEN 650 MG RE SUPP: 650.0000 mg | Freq: Four times a day (QID) | RECTAL | Status: DC | PRN

## 2023-03-14 MED ORDER — TRAZODONE HCL 50 MG PO TABS
25.0000 mg | ORAL_TABLET | Freq: Every evening | ORAL | Status: DC | PRN
  Administered 2023-03-15: 25 mg via ORAL
  Filled 2023-03-14 (×2): qty 1

## 2023-03-14 MED ORDER — FERROUS SULFATE 325 (65 FE) MG PO TABS
325.0000 mg | ORAL_TABLET | Freq: Every day | ORAL | Status: DC
  Administered 2023-03-19: 325 mg via ORAL
  Filled 2023-03-14: qty 1

## 2023-03-14 MED ORDER — LOSARTAN POTASSIUM 25 MG PO TABS
25.0000 mg | ORAL_TABLET | Freq: Every day | ORAL | Status: DC
  Filled 2023-03-14 (×2): qty 1

## 2023-03-14 NOTE — Assessment & Plan Note (Signed)
-   We will continue Imdur and as needed sublingual nitroglycerin.

## 2023-03-14 NOTE — Assessment & Plan Note (Signed)
-   We will continue Flomax 

## 2023-03-14 NOTE — Assessment & Plan Note (Addendum)
S/p EGD which shows some food impaction which was removed.  Also found to have a large occluding mass at GE junction.  Biopsy were taken, highly concerning for malignancy. GI is concerned that patient will need long-term TPN as he will not be a candidate for PEG tube placement due to occlusion of esophagus. -Oncology was also consulted -Follow-up biopsy results and further management per oncology

## 2023-03-14 NOTE — H&P (Addendum)
[place]   PATIENT NAME: [name]    MR#:  [id number]  DATE OF BIRTH:  08/11/1955  DATE OF ADMISSION:  03/14/2023  PRIMARY CARE PHYSICIAN: [name], MD   Patient is coming from: Home  REQUESTING/REFERRING PHYSICIAN: Miki Kins, MD  CHIEF COMPLAINT:   Chief Complaint  Patient presents with   Globus  Difficulty swallowing  HISTORY OF PRESENT ILLNESS:  [name]. is a 67 y.o. Caucasian male with medical history significant for asthma, COPD, CHF, coronary artery disease, depression, gout, type 2 diabetes mellitus, hypertension, dyslipidemia, PVD and OSA on CPAP, who presented to the emergency room with acute onset of dysphagia which has been going on over the last several weeks including both solids and liquids however he was able to drink small sips of water and has been tolerating his secretions.  He ate a large piece of bagel before his dysphagia started.  No chest pain or palpitations.  No cough or wheezing or dyspnea.  No headache or dizziness or blurred vision.  No presyncope or syncope.  No dysuria, oliguria or hematuria or flank pain.  ED Course: When the patient came to the ER, vital signs were within normal.  Labs revealed a BUN of 25 with a creatinine of 1.27 and blood glucose of 193 with otherwise unremarkable CMP.  CBC was within normal. EKG as reviewed by me : None Imaging: Outpatient barium swallow today revealed the following: 1. Extensive filling defects throughout the mid and distal thoracic esophagus, likely reflecting retained food and pills and markedly limiting mucosal evaluation. An obstructing distal esophageal mass or stricture cannot be excluded. Endoscopy should be considered. 2. Markedly delayed esophageal contrast transit, with only a small amount of contrast passing into the stomach during the examination. 3. Known small hiatal hernia.  Dr. Tobi Bastos was notified about the patient.  He will be n.p.o. after  midnight for EGD in a.m.  He will be admitted to a medical bed for further evaluation and management. PAST MEDICAL HISTORY:   Past Medical History:  Diagnosis Date   Arrhythmia    atrial fibrillation   Asthma    CHF (congestive heart failure) (HCC)    COPD (chronic obstructive pulmonary disease) (HCC)    Coronary artery disease    Depression    Diabetes mellitus without complication (HCC)    Gout    History anabolic steroid use    Hyperlipidemia    Hypertension    Hypogonadism in male    MI (myocardial infarction) (HCC)    Morbid obesity (HCC)    Myocardial infarction (HCC)    Peripheral vascular disease (HCC)    Perirectal abscess    Pleurisy    Sleep apnea    CPAP at night, no oxygen   Varicella     PAST SURGICAL HISTORY:   Past Surgical History:  Procedure Laterality Date   ABDOMINAL AORTIC ANEURYSM REPAIR     ACHILLES TENDON SURGERY Left 01/10/2021   Procedure: ACHILLES LENGTHENING/KIDNER;  Surgeon: Rosetta Posner, DPM;  Location: [facility];  Service: Podiatry;  Laterality: Left;   AMPUTATION Left 10/03/2021   Procedure: AMPUTATION BELOW KNEE;  Surgeon: Renford Dills, MD;  Location: [facility];  Service: Vascular;  Laterality: Left;   AMPUTATION Right 05/24/2022   Procedure: AMPUTATION BELOW KNEE;  Surgeon: Annice Needy, MD;  Location: [facility];  Service: General;  Laterality: Right;   AMPUTATION TOE Right 02/10/2016   Procedure: AMPUTATION TOE 3RD TOE;  Surgeon: Gwyneth Revels, DPM;  Location: [facility];  Service: Podiatry;  Laterality: Right;   AMPUTATION TOE Left 02/24/2020   Procedure: AMPUTATION TOE;  Surgeon: Rosetta Posner, DPM;  Location: [facility];  Service: Podiatry;  Laterality: Left;   APPLICATION OF WOUND VAC Left 02/29/2020   Procedure: APPLICATION OF WOUND VAC;  Surgeon: Rosetta Posner, DPM;  Location: [facility];  Service: Podiatry;  Laterality: Left;   CARDIAC CATHETERIZATION     CARDIOVERSION [name] 02/13/2022   Procedure: CARDIOVERSION;  Surgeon: Lamar Blinks, MD;  Location: [facility];  Service: Cardiovascular;  Laterality: [name];   COLONOSCOPY WITH PROPOFOL [name] 11/18/2015   Procedure: COLONOSCOPY WITH PROPOFOL;  Surgeon: Scot Jun, MD;  Location: [facility];  Service: Endoscopy;  Laterality: [name];   CORONARY ARTERY BYPASS GRAFT     CORONARY STENT INTERVENTION [name] 02/02/2020   Procedure: CORONARY STENT INTERVENTION;  Surgeon: Marcina Millard, MD;  Location: [facility];  Service: Cardiovascular;  Laterality: [name];   INCISION AND DRAINAGE Left 08/07/2021   Procedure: INCISION AND DRAINAGE-Partial Calcanectomy;  Surgeon: Rosetta Posner, DPM;  Location: [facility];  Service: Podiatry;  Laterality: Left;   IRRIGATION AND DEBRIDEMENT FOOT Left 02/29/2020   Procedure: IRRIGATION AND DEBRIDEMENT FOOT;  Surgeon: Rosetta Posner, DPM;  Location: [facility];  Service: Podiatry;  Laterality: Left;   IRRIGATION AND DEBRIDEMENT FOOT Left 02/24/2020   Procedure: IRRIGATION AND DEBRIDEMENT FOOT;  Surgeon: Rosetta Posner, DPM;  Location: [facility];  Service: Podiatry;  Laterality: Left;   KNEE ARTHROSCOPY     LEFT HEART CATH AND CORS/GRAFTS ANGIOGRAPHY [name] 02/02/2020   Procedure: LEFT HEART CATH AND CORS/GRAFTS ANGIOGRAPHY;  Surgeon: Dalia Heading, MD;  Location: [facility];  Service: Cardiovascular;  Laterality: [name];   LOWER EXTREMITY ANGIOGRAPHY Left 02/25/2020   Procedure: Lower Extremity Angiography;  Surgeon: Annice Needy, MD;  Location: [facility];  Service: Cardiovascular;  Laterality: Left;   LOWER EXTREMITY ANGIOGRAPHY Left 01/04/2021   Procedure: LOWER EXTREMITY ANGIOGRAPHY;  Surgeon: Annice Needy, MD;  Location: [facility];  Service: Cardiovascular;  Laterality: Left;   LOWER EXTREMITY ANGIOGRAPHY Right 05/21/2022   Procedure: Lower Extremity Angiography;  Surgeon: Annice Needy, MD;  Location: [facility];  Service: Cardiovascular;  Laterality: Right;   METATARSAL HEAD EXCISION Left 01/10/2021    Procedure: METATARSAL HEAD EXCISION - LEFT 5th;  Surgeon: Rosetta Posner, DPM;  Location: [facility];  Service: Podiatry;  Laterality: Left;   PERIPHERAL VASCULAR CATHETERIZATION Right 01/24/2016   Procedure: Lower Extremity Angiography;  Surgeon: Renford Dills, MD;  Location: [facility];  Service: Cardiovascular;  Laterality: Right;   PERIPHERAL VASCULAR CATHETERIZATION Right 01/25/2016   Procedure: Lower Extremity Angiography;  Surgeon: Renford Dills, MD;  Location: [facility];  Service: Cardiovascular;  Laterality: Right;   TOE AMPUTATION     TONSILLECTOMY      SOCIAL HISTORY:   Social History   Tobacco Use   Smoking status: Former    Current packs/day: 0.00    Average packs/day: 0.5 packs/day for 45.0 years (22.5 ttl pk-yrs)    Types: Cigarettes    Start date: 04/04/1970    Quit date: 04/05/2015    Years since quitting: 7.9   Smokeless tobacco: Never  Substance Use Topics   Alcohol use: Not Currently    Alcohol/week: 3.0 standard drinks of alcohol    Types: 3 Glasses of wine per week    FAMILY HISTORY:  Family History  Problem Relation Age of Onset   Hypertension Father    Coronary artery disease Father    Alcohol abuse Father    Heart failure Brother     DRUG ALLERGIES:   Allergies  Allergen Reactions   Penicillins Other (See Comments)    Happened at 67 years old and pt. stated he passed out  He has tolerated amoxicillin/clavulanate and ampicillin/sulbactam   Statins     Other reaction(s): Muscle Pain Causes legs to ache per pt   Metformin And Related Diarrhea    REVIEW OF SYSTEMS:   ROS As per history of present illness. All pertinent systems were reviewed above. Constitutional, HEENT, cardiovascular, respiratory, GI, GU, musculoskeletal, neuro, psychiatric, endocrine, integumentary and hematologic systems were reviewed and are otherwise negative/unremarkable except for positive findings mentioned above in the HPI.   MEDICATIONS AT  HOME:   Prior to Admission medications   Medication Sig Start Date End Date Taking? Authorizing Provider  acetaminophen (TYLENOL) 325 MG tablet Take 2 tablets (650 mg total) by mouth every 6 (six) hours as needed for mild pain (or Fever >/= 101). 05/29/22   Sunnie Nielsen, DO  amiodarone (PACERONE) 200 MG tablet Take 1 tablet (200 mg total) by mouth daily. 06/20/22   [name], MD  apixaban (ELIQUIS) 5 MG TABS tablet Take 5 mg by mouth 2 (two) times daily.    [provider]  aspirin EC 81 MG tablet Take 81 mg by mouth daily. Swallow whole.    [provider]  budesonide (PULMICORT) 0.5 MG/2ML nebulizer solution Take 2 mLs (0.5 mg total) by nebulization 2 (two) times daily. 11/10/21   Sunnie Nielsen, DO  Cholecalciferol 25 MCG (1000 UT) tablet Take 1,000 Units by mouth daily.    [provider]  Continuous Blood Gluc Sensor (FREESTYLE LIBRE 3 SENSOR) MISC  06/13/22   [provider]  Continuous Glucose Sensor (FREESTYLE LIBRE 3 SENSOR) MISC Use 1 sensor every 14 (fourteen) days. 01/10/23     Cyanocobalamin (VITAMIN B-12) 5000 MCG TBDP Take 10,000 mcg by mouth daily.    [provider]  diltiazem (CARDIZEM CD) 300 MG 24 hr capsule Take 1 capsule (300 mg total) by mouth daily. 03/04/23   [name], MD  ezetimibe (ZETIA) 10 MG tablet Take 10 mg by mouth at bedtime.    [provider]  ferrous sulfate 325 (65 FE) MG tablet Take 325 mg by mouth daily with breakfast.    [provider]  finasteride (PROSCAR) 5 MG tablet Take 1 tablet (5 mg total) by mouth daily. 11/11/21   Sunnie Nielsen, DO  furosemide (LASIX) 20 MG tablet Take 1 tablet (20 mg total) by mouth ONCE OR TWICE daily (total daily dose maximum 40 mg) as needed for increased swelling, shortness of breath, weight gain 5+ lbs over 1-2 days. Seek medical care if these symptoms are not improving with increased dose, or if you become dizzy or have low blood pressure.  05/29/22   Sunnie Nielsen, DO  gabapentin (NEURONTIN) 300 MG capsule Take 300 mg by mouth at bedtime.    [provider]  HUMALOG 100 UNIT/ML injection Inject 15 Units into the skin 3 (three) times daily with meals. >200 02/05/22   [provider]  Infant Care Products [facility]) OINT Apply 1 Application topically daily as needed.    [provider]  insulin glargine (LANTUS) 100 UNIT/ML injection Inject 15 units into the skin daily in AM and inject 25 units  into the skin daily in PM 05/29/22   Sunnie Nielsen, DO  isosorbide mononitrate (IMDUR) 60 MG 24 hr tablet Take 1 tablet (60 mg total) by mouth in the morning and at bedtime. 06/20/22   [name], MD  levothyroxine (SYNTHROID) 25 MCG tablet Take 1 tablet (25 mcg total) by mouth daily at 6 (six) AM. 11/11/21   Sunnie Nielsen, DO  losartan (COZAAR) 25 MG tablet Take 1 tablet (25 mg total) by mouth daily. 05/30/22   Sunnie Nielsen, DO  Multiple Vitamins-Minerals (MULTIVITAMIN WITH MINERALS) tablet Take 1 tablet by mouth daily.    [provider]  mupirocin ointment (BACTROBAN) 2 % Apply 1 Application topically daily. 12/13/22   Georgiana Spinner, NP  nitrofurantoin, macrocrystal-monohydrate, (MACROBID) 100 MG capsule Take 100 mg by mouth daily.    [provider]  nitroGLYCERIN (NITROSTAT) 0.4 MG SL tablet Place 1 tablet (0.4 mg total) under the tongue every 5 (five) minutes as needed for chest pain. 06/20/22   [name], MD  nutrition supplement, JUVEN, (JUVEN) PACK Take 1 packet by mouth 2 (two) times daily between meals. 05/29/22   Sunnie Nielsen, DO  nystatin (MYCOSTATIN/NYSTOP) powder Apply 1 Application topically 2 (two) times daily.    [provider]  ondansetron (ZOFRAN) 4 MG tablet Take 1 tablet (4 mg total) by mouth every 6 (six) hours as needed for nausea. 05/29/22   Sunnie Nielsen, DO  pramipexole (MIRAPEX) 1 MG tablet Take 2 mg by mouth at bedtime.     [provider]  primidone (MYSOLINE) 250 MG tablet Take 250 mg by mouth 2 (two) times daily. 01/23/20   [provider]  risperiDONE (RISPERDAL) 0.5 MG tablet Take 0.5 tablets (0.25 mg total) by mouth 2 (two) times daily. 05/29/22   Sunnie Nielsen, DO  sulfamethoxazole-trimethoprim (BACTRIM DS) 800-160 MG tablet Take 1 tablet by mouth 2 (two) times daily. 12/13/22   Georgiana Spinner, NP  tamsulosin (FLOMAX) 0.4 MG CAPS capsule Take 0.4 mg by mouth daily.    [provider]  vitamin C (ASCORBIC ACID) 500 MG tablet Take 500 mg by mouth daily.    [provider]  zinc gluconate 50 MG tablet Take 50 mg by mouth daily.    [provider]  zinc sulfate 220 (50 Zn) MG capsule Take 1 capsule (220 mg total) by mouth daily. 05/29/22   Sunnie Nielsen, DO      VITAL SIGNS:  Blood pressure 96/60, pulse 76, temperature 98 F (36.7 C), resp. rate 15, SpO2 98%.  PHYSICAL EXAMINATION:  Physical Exam  GENERAL:  67 y.o.-year-old patient lying in the bed with no acute distress.  EYES: Pupils equal, round, reactive to light and accommodation. No scleral icterus. Extraocular muscles intact.  HEENT: Head atraumatic, normocephalic. Oropharynx and nasopharynx clear.  NECK:  Supple, no jugular venous distention. No thyroid enlargement, no tenderness.  LUNGS: Normal breath sounds bilaterally, no wheezing, rales,rhonchi or crepitation. No use of accessory muscles of respiration.  CARDIOVASCULAR: Regular rate and rhythm, S1, S2 normal. No murmurs, rubs, or gallops.  ABDOMEN: Soft, nondistended, nontender. Bowel sounds present. No organomegaly or mass.  EXTREMITIES: He has bilateral below-knee amputations with intact stumps. NEUROLOGIC: Cranial nerves II through XII are intact. Muscle strength 5/5 in all extremities. Sensation intact. Gait not checked.  PSYCHIATRIC: The patient is alert and oriented x 3.  Normal affect and good eye contact. SKIN: No obvious rash,  lesion, or ulcer.   LABORATORY PANEL:   CBC Recent  Labs  Lab 03/15/23 0402  WBC 6.7  HGB 11.4*  HCT 36.1*  PLT 160   ------------------------------------------------------------------------------------------------------------------  Chemistries  Recent Labs  Lab 03/14/23 1618 03/15/23 0402  NA 137 138  K 4.4 3.7  CL 98 100  CO2 30 30  GLUCOSE 193* 188*  BUN 25* 27*  CREATININE 1.27* 1.14  CALCIUM 8.9 8.5*  AST 15  --   ALT 19  --   ALKPHOS 122  --   BILITOT 0.4  --    ------------------------------------------------------------------------------------------------------------------  Cardiac Enzymes No results for input(s): "TROPONINI" in the last 168 hours. ------------------------------------------------------------------------------------------------------------------  RADIOLOGY:  DG ESOPHAGUS W DOUBLE CM (HD)  Result Date: 03/14/2023 CLINICAL DATA:  Provided history: Essential hypertension. Dysphagia, unspecified type. Additional history: the patient reports dysphagia and vomiting for two weeks with 20 pound weight loss in this time. EXAM: ESOPHAGUS/BARIUM SWALLOW TECHNIQUE: A single contrast examination was performed using thin liquid barium. The exam was performed by Pattricia Boss, PA-C and was supervised and interpreted by Dr. Jackey Loge. FLUOROSCOPY: Radiation Exposure Index (as provided by the fluoroscopic device): 37.70 mGy Kerma COMPARISON:  CT abdomen/pelvis 08/13/2022.  Chest CT 09/10/2013. FINDINGS: Extensive filling defects throughout the mid and distal thoracic esophagus, likely reflecting retained food and pills and markedly limiting mucosal evaluation. Markedly delayed esophageal contrast transit, with only a small amount of contrast passing into the stomach during the examination. Known small hiatal hernia (better appreciated on the prior CT abdomen/pelvis of 08/13/2022). Findings discussed with Dr. Aram Beecham by telephone at 2:40 p.m. on 03/14/2023.  The patient was transported directly from the fluoroscopy suite to the emergency department. IMPRESSION: 1. Extensive filling defects throughout the mid and distal thoracic esophagus, likely reflecting retained food and pills and markedly limiting mucosal evaluation. An obstructing distal esophageal mass or stricture cannot be excluded. Endoscopy should be considered. 2. Markedly delayed esophageal contrast transit, with only a small amount of contrast passing into the stomach during the examination. 3. Known small hiatal hernia. Electronically Signed   By: Jackey Loge D.O.   On: 03/14/2023 15:38      IMPRESSION AND PLAN:  Assessment and Plan: * Dysphagia - The patient be admitted to a medical bed. - His dysphagia appears to be due to esophageal dysmotility on barium swallow with inability to exclude obstructing distal esophageal mass or stricture. - He will be on clear liquids as tolerated tonight and n.p.o. after midnight. - GI consult will be obtained for EGD. - Dr. Okey Dupre was notified and is aware about the patient.  AKI (acute kidney injury) (HCC) - This is likely all due to diminished p.o. intake. - He will be hydrated with IV normal saline and will follow BMP. - We will avoid nephrotoxins.  Type 2 diabetes mellitus with peripheral neuropathy (HCC) - Patient will be placed on supplemental coverage with NovoLog. - We will continue basal coverage. - We will continue Neurontin.  Hypothyroidism - We will continue Synthroid.  Essential hypertension - We will resume his antihypertensive therapy while holding off nephrotoxins.  PAD (peripheral artery disease) (HCC) - We will resume his antihypertensive therapy while holding off nephrotoxins.  Atrial fibrillation, chronic (HCC) - We will continue Cardizem CD and amiodarone as well as close.  BPH (benign prostatic hyperplasia) - We will continue Flomax.  Coronary artery disease - We will continue Imdur and as needed sublingual  nitroglycerin.   DVT prophylaxis: Eliquis. Advanced Care Planning:  Code Status: full code. Family Communication:  The plan of care was  discussed in details with the patient (and family). I answered all questions. The patient agreed to proceed with the above mentioned plan. Further management will depend upon hospital course. Disposition Plan: Back to previous home environment Consults called: GI consult. All the records are reviewed and case discussed with ED provider.  Status is: Inpatient  At the time of the admission, it appears that the appropriate admission status for this patient is inpatient.  This is judged to be reasonable and necessary in order to provide the required intensity of service to ensure the patient's safety given the presenting symptoms, physical exam findings and initial radiographic and laboratory data in the context of comorbid conditions.  The patient requires inpatient status due to high intensity of service, high risk of further deterioration and high frequency of surveillance required.  I certify that at the time of admission, it is my clinical judgment that the patient will require inpatient hospital care extending more than 2 midnights.                            Dispo: The patient is from: Home              Anticipated d/c is to: Home              Patient currently is not medically stable to d/c.              Difficult to place patient: No  Hannah Beat M.D on 03/15/2023 at 4:43 AM  [facility]   From 7 PM-7 AM, contact night-coverage [email or web address]  CC: Primary care physician; [name], MD

## 2023-03-14 NOTE — ED Triage Notes (Signed)
Pt states that for the last few weeks he has been feeling like he cannot eat or drink d/t something being in his throat. Pt was referred by GI after having barium swallow test that showed multiple pills in distal esophagus. Pt denies SOB.

## 2023-03-14 NOTE — Assessment & Plan Note (Signed)
-   We will resume his antihypertensive therapy while holding off nephrotoxins.

## 2023-03-14 NOTE — ED Provider Notes (Signed)
[facility] Provider Note    Event Date/Time   First MD Initiated Contact with Patient 03/14/23 1615     (approximate)   History   Globus   HPI  Alec Wolter. is a 67 y.o. male with a history of CHF, COPD, CAD, diabetes, hypertension, hyperlipidemia and OSA who presents with dysphagia, gradual onset of last several weeks, affecting both solids and liquids although the patient states that he is able to drink small sips of water and he is tolerating his secretions.  He believes that it started after he ate a large piece of a bagel.  He denies any chest or abdominal pain.  He has no shortness of breath.  He denies feeling dizzy or lightheaded.  I reviewed the past medical records.  The patient was most recently seen by Dr. Judithann Sheen from internal medicine on 10/31 and subsequently had a phone encounter on 11/6 in which he reported dysphagia.  Barium swallow study from today shows filling defects throughout the mid and distal esophagus likely reflecting retained food and pills as well as markedly delayed esophageal contrast transit.  Endoscopy is recommended.   Physical Exam   Triage Vital Signs: ED Triage Vitals [03/14/23 1503]  Encounter Vitals Group     BP 133/82     Systolic BP Percentile      Diastolic BP Percentile      Pulse Rate 69     Resp 14     Temp 98.3 F (36.8 C)     Temp Source Oral     SpO2 92 %     Weight      Height      Head Circumference      Peak Flow      Pain Score 0     Pain Loc      Pain Education      Exclude from Growth Chart     Most recent vital signs: Vitals:   03/14/23 1503 03/14/23 1630  BP: 133/82 116/71  Pulse: 69 73  Resp: 14   Temp: 98.3 F (36.8 C)   SpO2: 92% 95%     General: Awake, no distress.  CV:  Good peripheral perfusion.  Resp:  Normal effort.  Abd:  No distention.  Other:  No jaundice or scleral icterus.  Moist mucous membranes.   ED Results / Procedures / Treatments   Labs (all  labs ordered are listed, but only abnormal results are displayed) Labs Reviewed  COMPREHENSIVE METABOLIC PANEL - Abnormal; Notable for the following components:      Result Value   Glucose, Bld 193 (*)    BUN 25 (*)    Creatinine, Ser 1.27 (*)    All other components within normal limits  CBC WITH DIFFERENTIAL/PLATELET     EKG   RADIOLOGY   PROCEDURES:  Critical Care performed: No  Procedures   MEDICATIONS ORDERED IN ED: Medications - No data to display   IMPRESSION / MDM / ASSESSMENT AND PLAN / ED COURSE  I reviewed the triage vital signs and the nursing notes.  67 year old male with PMH as noted above presents with dysphagia for the last several weeks to both solids and liquids, although he is able to tolerate secretions.  Swallow study today showed filling defects and delayed contrast transit.  Differential diagnosis includes, but is not limited to, esophageal stricture, mass, food impaction, other obstruction.  Patient's presentation is most consistent with acute presentation with potential threat to life  or bodily function.  We will obtain lab workup.  I consulted and discussed case with Dr. Tobi Bastos from GI who recommends admission to the hospitalist service with plan for upper endoscopy tomorrow.  ----------------------------------------- 7:33 PM on 03/14/2023 -----------------------------------------  BMP and CBC are unremarkable.  I consulted Dr. Arville Care from the hospitalist service; based on our discussion he agrees to evaluate the patient for admission.   FINAL CLINICAL IMPRESSION(S) / ED DIAGNOSES   Final diagnoses:  Esophageal dysphagia     Rx / DC Orders   ED Discharge Orders     None        Note:  This document was prepared using Dragon voice recognition software and may include unintentional dictation errors.    Dionne Bucy, MD 03/14/23 [phone]

## 2023-03-14 NOTE — Assessment & Plan Note (Addendum)
S/p bilateral BKA. Not on any antiplatelet or statin at home

## 2023-03-14 NOTE — Assessment & Plan Note (Signed)
-   We will continue Synthroid. 

## 2023-03-14 NOTE — Assessment & Plan Note (Addendum)
-   This is likely all due to diminished p.o. intake. Creatinine improved to 1.14 with IV fluid. -Continue with gentle IV fluid as patient might not be able to take p.o.

## 2023-03-14 NOTE — Assessment & Plan Note (Signed)
-   We will continue Cardizem CD and amiodarone as well as close.

## 2023-03-14 NOTE — Assessment & Plan Note (Signed)
-   Patient will be placed on supplemental coverage with NovoLog. - We will continue basal coverage. - We will continue Neurontin.

## 2023-03-15 ENCOUNTER — Inpatient Hospital Stay: Payer: Medicare Other | Admitting: Anesthesiology

## 2023-03-15 ENCOUNTER — Encounter: Payer: Self-pay | Admitting: Family Medicine

## 2023-03-15 ENCOUNTER — Other Ambulatory Visit: Payer: Self-pay

## 2023-03-15 ENCOUNTER — Encounter
Admission: EM | Disposition: A | Discharge status: Home / Self Care | Payer: Self-pay | Source: Home / Self Care | Attending: Internal Medicine

## 2023-03-15 ENCOUNTER — Inpatient Hospital Stay: Payer: Medicare Other

## 2023-03-15 DIAGNOSIS — N400 Enlarged prostate without lower urinary tract symptoms: Secondary | ICD-10-CM

## 2023-03-15 DIAGNOSIS — C155 Malignant neoplasm of lower third of esophagus: Secondary | ICD-10-CM

## 2023-03-15 DIAGNOSIS — E039 Hypothyroidism, unspecified: Secondary | ICD-10-CM | POA: Diagnosis not present

## 2023-03-15 DIAGNOSIS — R1310 Dysphagia, unspecified: Secondary | ICD-10-CM | POA: Diagnosis not present

## 2023-03-15 DIAGNOSIS — N179 Acute kidney failure, unspecified: Secondary | ICD-10-CM | POA: Diagnosis not present

## 2023-03-15 DIAGNOSIS — K2289 Other specified disease of esophagus: Secondary | ICD-10-CM

## 2023-03-15 DIAGNOSIS — I482 Chronic atrial fibrillation, unspecified: Secondary | ICD-10-CM

## 2023-03-15 DIAGNOSIS — I739 Peripheral vascular disease, unspecified: Secondary | ICD-10-CM

## 2023-03-15 DIAGNOSIS — I10 Essential (primary) hypertension: Secondary | ICD-10-CM | POA: Diagnosis not present

## 2023-03-15 DIAGNOSIS — C159 Malignant neoplasm of esophagus, unspecified: Secondary | ICD-10-CM | POA: Diagnosis present

## 2023-03-15 HISTORY — PX: BIOPSY: SHX5522

## 2023-03-15 HISTORY — PX: ESOPHAGOGASTRODUODENOSCOPY (EGD) WITH PROPOFOL: SHX5813

## 2023-03-15 HISTORY — PX: IMPACTION REMOVAL: SHX5858

## 2023-03-15 LAB — BASIC METABOLIC PANEL
Anion gap: 8 (ref 5–15)
BUN: 27 mg/dL — ABNORMAL HIGH (ref 8–23)
CO2: 30 mmol/L (ref 22–32)
Calcium: 8.5 mg/dL — ABNORMAL LOW (ref 8.9–10.3)
Chloride: 100 mmol/L (ref 98–111)
Creatinine, Ser: 1.14 mg/dL (ref 0.61–1.24)
GFR, Estimated: >60 mL/min (ref >60)
Glucose, Bld: 188 mg/dL — ABNORMAL HIGH (ref 70–99)
Potassium: 3.7 mmol/L (ref 3.5–5.1)
Sodium: 138 mmol/L (ref 135–145)

## 2023-03-15 LAB — CBC
HCT: 36.1 % — ABNORMAL LOW (ref 39.0–52.0)
Hemoglobin: 11.4 g/dL — ABNORMAL LOW (ref 13.0–17.0)
MCH: 29.6 pg (ref 26.0–34.0)
MCHC: 31.6 g/dL (ref 30.0–36.0)
MCV: 93.8 fL (ref 80.0–100.0)
Platelets: 160 10*3/uL (ref 150–400)
RBC: 3.85 MIL/uL — ABNORMAL LOW (ref 4.22–5.81)
RDW: 14.4 % (ref 11.5–15.5)
WBC: 6.7 10*3/uL (ref 4.0–10.5)
nRBC: 0 % (ref 0.0–0.2)

## 2023-03-15 LAB — GLUCOSE, CAPILLARY
Glucose-Capillary: 174 mg/dL — ABNORMAL HIGH (ref 70–99)
Glucose-Capillary: 142 mg/dL — ABNORMAL HIGH (ref 70–99)
Glucose-Capillary: 106 mg/dL — ABNORMAL HIGH (ref 70–99)

## 2023-03-15 LAB — IRON AND TIBC
Iron: 67 ug/dL (ref 45–182)
Saturation Ratios: 26 % (ref 17.9–39.5)
TIBC: 260 ug/dL (ref 250–450)
UIBC: 193 ug/dL

## 2023-03-15 LAB — HEMOGLOBIN A1C
Hgb A1c MFr Bld: 7.8 % — ABNORMAL HIGH (ref 4.8–5.6)
Mean Plasma Glucose: 177.16 mg/dL

## 2023-03-15 LAB — FERRITIN: Ferritin: 21 ng/mL — ABNORMAL LOW (ref 24–336)

## 2023-03-15 SURGERY — ESOPHAGOGASTRODUODENOSCOPY (EGD) WITH PROPOFOL
Anesthesia: General

## 2023-03-15 MED ORDER — SODIUM CHLORIDE 0.9 % IV SOLN: INTRAVENOUS | Status: DC

## 2023-03-15 MED ORDER — PROPOFOL 10 MG/ML IV BOLUS
INTRAVENOUS | Status: DC | PRN
  Administered 2023-03-15: 150 mg via INTRAVENOUS

## 2023-03-15 MED ORDER — SUCCINYLCHOLINE CHLORIDE 200 MG/10ML IV SOSY
PREFILLED_SYRINGE | INTRAVENOUS | Status: DC | PRN
  Administered 2023-03-15: 160 mg via INTRAVENOUS

## 2023-03-15 MED ORDER — MORPHINE SULFATE (PF) 2 MG/ML IV SOLN
1.0000 mg | INTRAVENOUS | Status: DC | PRN
  Administered 2023-03-15 – 2023-03-18 (×5): 1 mg via INTRAVENOUS
  Filled 2023-03-15 (×5): qty 1

## 2023-03-15 MED ORDER — LIDOCAINE HCL (CARDIAC) PF 100 MG/5ML IV SOSY
PREFILLED_SYRINGE | INTRAVENOUS | Status: DC | PRN
  Administered 2023-03-15: 40 mg via INTRAVENOUS

## 2023-03-15 MED ORDER — INSULIN ASPART 100 UNIT/ML IJ SOLN
0.0000 [IU] | INTRAMUSCULAR | Status: DC
  Administered 2023-03-15 – 2023-03-18 (×2): 2 [IU] via SUBCUTANEOUS
  Administered 2023-03-19: 3 [IU] via SUBCUTANEOUS
  Administered 2023-03-19 (×2): 5 [IU] via SUBCUTANEOUS
  Administered 2023-03-19: 3 [IU] via SUBCUTANEOUS
  Administered 2023-03-19: 5 [IU] via SUBCUTANEOUS
  Administered 2023-03-19: 2 [IU] via SUBCUTANEOUS
  Administered 2023-03-20: 5 [IU] via SUBCUTANEOUS
  Administered 2023-03-20: 3 [IU] via SUBCUTANEOUS
  Administered 2023-03-20: 5 [IU] via SUBCUTANEOUS
  Administered 2023-03-20: 6 [IU] via SUBCUTANEOUS
  Administered 2023-03-20: 5 [IU] via SUBCUTANEOUS
  Administered 2023-03-20: 3 [IU] via SUBCUTANEOUS
  Administered 2023-03-21: 15 [IU] via SUBCUTANEOUS
  Administered 2023-03-21: 5 [IU] via SUBCUTANEOUS
  Administered 2023-03-21: 15 [IU] via SUBCUTANEOUS
  Administered 2023-03-21: 5 [IU] via SUBCUTANEOUS
  Administered 2023-03-21 (×2): 3 [IU] via SUBCUTANEOUS
  Administered 2023-03-22 (×2): 5 [IU] via SUBCUTANEOUS
  Filled 2023-03-15 (×20): qty 1

## 2023-03-15 MED ORDER — ONDANSETRON HCL 4 MG/2ML IJ SOLN
INTRAMUSCULAR | Status: DC | PRN
  Administered 2023-03-15: 4 mg via INTRAVENOUS

## 2023-03-15 MED ORDER — IOHEXOL 300 MG/ML  SOLN
15.0000 mL | INTRAMUSCULAR | Status: AC
Start: 2023-03-15 — End: 2023-03-15
  Administered 2023-03-15 (×2): 15 mL via ORAL

## 2023-03-15 MED ORDER — IOHEXOL 300 MG/ML  SOLN
100.0000 mL | Freq: Once | INTRAMUSCULAR | Status: AC | PRN
  Administered 2023-03-15: 100 mL via INTRAVENOUS

## 2023-03-15 NOTE — Op Note (Signed)
[facility] Gastroenterology Patient Name: [name] Procedure Date: 03/15/2023 12:02 PM MRN: [id number] Account #: [id number] Date of Birth: 02/22/1956 Admit Type: Outpatient Age: 67 Room: [facility] ENDO ROOM 1 Gender: Male Note Status: Finalized Instrument Name: Upper Endoscope [id number] Procedure:             Upper GI endoscopy Indications:           Dysphagia Providers:             Wyline Mood MD, MD Referring MD:          Duane Lope. Judithann Sheen, MD (Referring MD) Medicines:             Monitored Anesthesia Care, Propofol per Anesthesia Complications:         No immediate complications. Procedure:             Pre-Anesthesia Assessment:                        - Prior to the procedure, a History and Physical was                         performed, and patient medications, allergies and                         sensitivities were reviewed. The patient's tolerance                         of previous anesthesia was reviewed.                        - The risks and benefits of the procedure and the                         sedation options and risks were discussed with the                         patient. All questions were answered and informed                         consent was obtained.                        - ASA Grade Assessment: II - A patient with mild                         systemic disease.                        After obtaining informed consent, the endoscope was                         passed under direct vision. Throughout the procedure,                         the patient's blood pressure, pulse, and oxygen                         saturations were monitored continuously. The  Endosonoscope was introduced through the mouth, and                         advanced to the lower third of esophagus. The upper GI                         endoscopy was accomplished with ease. The patient                         tolerated the procedure  well. Findings:      Food was found in the lower third of the esophagus. Removal was       accomplished with a Raptor grasping device and Roth net.      One malignant-appearing, intrinsic severe (stenosis; an endoscope cannot       pass) stenosis was found at the gastroesophageal junction. This stenosis       measured 2 mm (inner diameter). The stenosis was not traversed.      A large, ulcerating mass with bleeding and stigmata of recent bleeding       was found in the lower third of the esophagus, 40 cm from the incisors.       The mass was completely obstructing and circumferential. Biopsies were       taken with a cold forceps for histology. Impression:            - Food in the lower third of the esophagus. Removal                         was successful.                        - Malignant-appearing esophageal stenosis.                        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 Biopsied. Recommendation:        - Return patient to hospital ward for ongoing care.                        - NPO since the esophagus is severely stenosed due to                         mass                        Likely need TPN                        Obtain staging CT scans and will need oncology input                         about next steps after scans and bx results Procedure Code(s):     --- Professional ---  16109, Esophagoscopy, flexible, transoral; with                         removal of foreign body(s)                        43202, Esophagoscopy, flexible, transoral; with                         biopsy, single or multiple Diagnosis Code(s):     --- Professional ---                        U04.540J, Food in esophagus causing other injury,                         initial encounter                        K22.2, Esophageal obstruction                         D49.0, Neoplasm of unspecified behavior of digestive                         system                        R13.10, Dysphagia, unspecified CPT copyright 2022 [employer]. All rights reserved. The codes documented in this report are preliminary and upon coder review may  be revised to meet current compliance requirements. Wyline Mood, MD Wyline Mood MD, MD 03/15/2023 12:33:00 PM This report has been signed electronically. Number of Addenda: 0 Note Initiated On: 03/15/2023 12:02 PM Estimated Blood Loss:  Estimated blood loss: none.      [facility]

## 2023-03-15 NOTE — ED Notes (Signed)
 .9 ns bolus vo per Dr Joylene Igo for SBP less than 90 pt remains aox4 speaking in full clear sentences pt on 2L O2 for O2 sat less than 91 pt hx copd pt denies needs @ this time 20g piv to RFA remains cdi secured not infiltrated @ this time infusing .9 NS 48ml/hr per admission order pt remains NPO

## 2023-03-15 NOTE — Progress Notes (Signed)
Patient to be transported down to Endo with Patient Transporter.  Patient's wife upset that no one called her prior to patient going for procedure.  Called Endo Nurse's station to ask that wife be updated post procedure.   Alternate Contact Person   LASSITER,[name] (Spouse) [phone] (Mobile)

## 2023-03-15 NOTE — Transfer of Care (Signed)
Immediate Anesthesia Transfer of Care Note  Patient: [name].  Procedure(s) Performed: ESOPHAGOGASTRODUODENOSCOPY (EGD) WITH PROPOFOL  Patient Location: PACU and Endoscopy Unit  Anesthesia Type:General  Level of Consciousness: drowsy  Airway & Oxygen Therapy: Patient Spontanous Breathing and Patient connected to face mask oxygen  Post-op Assessment: Report given to RN and Post -op Vital signs reviewed and stable  Post vital signs: Reviewed and stable  Last Vitals:  Vitals Value Taken Time  BP 133/69 03/15/23 1240  Temp 36.1 C 03/15/23 1240  Pulse 73 03/15/23 1240  Resp 15 03/15/23 1240  SpO2 99 % 03/15/23 1240    Last Pain:  Vitals:   03/15/23 1240  TempSrc: Temporal  PainSc:          Complications: No notable events documented.

## 2023-03-15 NOTE — Anesthesia Postprocedure Evaluation (Signed)
Anesthesia Post Note  Patient: [name].  Procedure(s) Performed: ESOPHAGOGASTRODUODENOSCOPY (EGD) WITH PROPOFOL  Patient location during evaluation: PACU Anesthesia Type: General Level of consciousness: awake and awake and alert Pain management: satisfactory to patient Vital Signs Assessment: post-procedure vital signs reviewed and stable Respiratory status: spontaneous breathing Cardiovascular status: stable Anesthetic complications: no   No notable events documented.   Last Vitals:  Vitals:   03/15/23 1310 03/15/23 1313  BP: 108/78 110/70  Pulse: 69 69  Resp: 14   Temp:    SpO2: 95% 93%    Last Pain:  Vitals:   03/15/23 1310  TempSrc:   PainSc: 0-No pain                 VAN STAVEREN,Loyce Flaming

## 2023-03-15 NOTE — Progress Notes (Signed)
Patient admitted for dysphagia.  Currently NPO.  Scheduled 10 AM oral medications.  Notified Dr. Nelson Chimes and Pharmacist Rayfield Citizen.  Dr. Nelson Chimes responded to hold all AM medications and to remain NPO.     03/15/23 0905  Provider Notification  Provider Name/Title Dr. Nelson Chimes Attending  Date Provider Notified 03/15/23  Time Provider Notified [phone]  Method of Notification  (Secure chat)  Notification Reason Other (Comment) (Scheduled 10AM meds. NPO and not able to swallow)  Provider response Evaluate remotely  Date of Provider Response 03/15/23  Time of Provider Response [phone]

## 2023-03-15 NOTE — Consult Note (Signed)
[facility] CONSULT NOTE  Patient Care Team: [name], MD as PCP - General (Internal Medicine) [name], MD as PCP - Cardiology (Cardiology)  CHIEF COMPLAINTS/PURPOSE OF CONSULTATION: esophagus cancer  HISTORY OF PRESENTING ILLNESS:  [name]. 67 y.o.  male pleasant patient with a medical history significant for asthma, COPD, CHF, coronary artery disease, depression, gout,, hypertension, dyslipidemia, poorly controlled type II diabetes PVD status post bilateral BKA and OSA on CPAP, who presented to the emergency room with acute onset of dysphagia.  Patient noted to have worsening difficulty swallowing both to solids and liquids for the last many weeks.  Patient had an abnormal barium swallow test that showed retained food material.  Patient also noted to have progressive weight loss.    Difficulty swallowing:solids > liquids.  Hoarseness of voice: none  Black colored stools: none  Weight loss: yes Bone pain: none  Mother/father-colon cancer. Met grand pa- colon cancer  Review of Systems  Constitutional:  Positive for malaise/fatigue and weight loss. Negative for chills, diaphoresis and fever.  HENT:  Negative for nosebleeds and sore throat.   Eyes:  Negative for double vision.  Respiratory:  Negative for cough, hemoptysis, sputum production, shortness of breath and wheezing.   Cardiovascular:  Negative for chest pain, palpitations, orthopnea and leg swelling.  Gastrointestinal:  Positive for nausea and vomiting. Negative for abdominal pain, blood in stool, constipation, diarrhea, heartburn and melena.  Genitourinary:  Negative for dysuria, frequency and urgency.  Musculoskeletal:  Negative for back pain and joint pain.  Skin: Negative.  Negative for itching and rash.  Neurological:  Negative for dizziness, tingling, focal weakness, weakness and headaches.  Endo/Heme/Allergies:  Does not bruise/bleed easily.  Psychiatric/Behavioral:   Negative for depression. The patient is not nervous/anxious and does not have insomnia.     MEDICAL HISTORY:  Past Medical History:  Diagnosis Date   Arrhythmia    atrial fibrillation   Asthma    CHF (congestive heart failure) (HCC)    COPD (chronic obstructive pulmonary disease) (HCC)    Coronary artery disease    Depression    Diabetes mellitus without complication (HCC)    Gout    History anabolic steroid use    Hyperlipidemia    Hypertension    Hypogonadism in male    MI (myocardial infarction) (HCC)    Morbid obesity (HCC)    Myocardial infarction (HCC)    Peripheral vascular disease (HCC)    Perirectal abscess    Pleurisy    Sleep apnea    CPAP at night, no oxygen   Varicella     SURGICAL HISTORY: Past Surgical History:  Procedure Laterality Date   ABDOMINAL AORTIC ANEURYSM REPAIR     ACHILLES TENDON SURGERY Left 01/10/2021   Procedure: ACHILLES LENGTHENING/KIDNER;  Surgeon: Rosetta Posner, DPM;  Location: [facility];  Service: Podiatry;  Laterality: Left;   AMPUTATION Left 10/03/2021   Procedure: AMPUTATION BELOW KNEE;  Surgeon: Renford Dills, MD;  Location: [facility];  Service: Vascular;  Laterality: Left;   AMPUTATION Right 05/24/2022   Procedure: AMPUTATION BELOW KNEE;  Surgeon: Annice Needy, MD;  Location: [facility];  Service: General;  Laterality: Right;   AMPUTATION TOE Right 02/10/2016   Procedure: AMPUTATION TOE 3RD TOE;  Surgeon: Gwyneth Revels, DPM;  Location: [facility];  Service: Podiatry;  Laterality: Right;   AMPUTATION TOE Left 02/24/2020   Procedure: AMPUTATION TOE;  Surgeon: Rosetta Posner, DPM;  Location: [facility];  Service: Podiatry;  Laterality: Left;   APPLICATION OF WOUND VAC Left 02/29/2020   Procedure: APPLICATION OF WOUND VAC;  Surgeon: Rosetta Posner, DPM;  Location: [facility];  Service: Podiatry;  Laterality: Left;   CARDIAC CATHETERIZATION     CARDIOVERSION [name] 02/13/2022   Procedure: CARDIOVERSION;  Surgeon: Lamar Blinks, MD;   Location: [facility];  Service: Cardiovascular;  Laterality: [name];   COLONOSCOPY WITH PROPOFOL [name] 11/18/2015   Procedure: COLONOSCOPY WITH PROPOFOL;  Surgeon: Scot Jun, MD;  Location: [facility];  Service: Endoscopy;  Laterality: [name];   CORONARY ARTERY BYPASS GRAFT     CORONARY STENT INTERVENTION [name] 02/02/2020   Procedure: CORONARY STENT INTERVENTION;  Surgeon: Marcina Millard, MD;  Location: [facility];  Service: Cardiovascular;  Laterality: [name];   INCISION AND DRAINAGE Left 08/07/2021   Procedure: INCISION AND DRAINAGE-Partial Calcanectomy;  Surgeon: Rosetta Posner, DPM;  Location: [facility];  Service: Podiatry;  Laterality: Left;   IRRIGATION AND DEBRIDEMENT FOOT Left 02/29/2020   Procedure: IRRIGATION AND DEBRIDEMENT FOOT;  Surgeon: Rosetta Posner, DPM;  Location: [facility];  Service: Podiatry;  Laterality: Left;   IRRIGATION AND DEBRIDEMENT FOOT Left 02/24/2020   Procedure: IRRIGATION AND DEBRIDEMENT FOOT;  Surgeon: Rosetta Posner, DPM;  Location: [facility];  Service: Podiatry;  Laterality: Left;   KNEE ARTHROSCOPY     LEFT HEART CATH AND CORS/GRAFTS ANGIOGRAPHY [name] 02/02/2020   Procedure: LEFT HEART CATH AND CORS/GRAFTS ANGIOGRAPHY;  Surgeon: Dalia Heading, MD;  Location: [facility];  Service: Cardiovascular;  Laterality: [name];   LOWER EXTREMITY ANGIOGRAPHY Left 02/25/2020   Procedure: Lower Extremity Angiography;  Surgeon: Annice Needy, MD;  Location: [facility];  Service: Cardiovascular;  Laterality: Left;   LOWER EXTREMITY ANGIOGRAPHY Left 01/04/2021   Procedure: LOWER EXTREMITY ANGIOGRAPHY;  Surgeon: Annice Needy, MD;  Location: [facility];  Service: Cardiovascular;  Laterality: Left;   LOWER EXTREMITY ANGIOGRAPHY Right 05/21/2022   Procedure: Lower Extremity Angiography;  Surgeon: Annice Needy, MD;  Location: [facility];  Service: Cardiovascular;  Laterality: Right;   METATARSAL HEAD EXCISION Left 01/10/2021   Procedure:  METATARSAL HEAD EXCISION - LEFT 5th;  Surgeon: Rosetta Posner, DPM;  Location: [facility];  Service: Podiatry;  Laterality: Left;   PERIPHERAL VASCULAR CATHETERIZATION Right 01/24/2016   Procedure: Lower Extremity Angiography;  Surgeon: Renford Dills, MD;  Location: [facility];  Service: Cardiovascular;  Laterality: Right;   PERIPHERAL VASCULAR CATHETERIZATION Right 01/25/2016   Procedure: Lower Extremity Angiography;  Surgeon: Renford Dills, MD;  Location: [facility];  Service: Cardiovascular;  Laterality: Right;   TOE AMPUTATION     TONSILLECTOMY      SOCIAL HISTORY: Social History   Socioeconomic History   Marital status: Married    Spouse name: Not on file   Number of children: Not on file   Years of education: Not on file   Highest education level: Not on file  Occupational History   Not on file  Tobacco Use   Smoking status: Former    Current packs/day: 0.00    Average packs/day: 0.5 packs/day for 45.0 years (22.5 ttl pk-yrs)    Types: Cigarettes    Start date: 04/04/1970    Quit date: 04/05/2015    Years since quitting: 7.9   Smokeless tobacco: Never  Vaping Use   Vaping status: Every Day  Substance and Sexual Activity   Alcohol use: Not Currently    Alcohol/week: 3.0 standard drinks  of alcohol    Types: 3 Glasses of wine per week   Drug use: No   Sexual activity: Not on file  Other Topics Concern   Not on file  Social History Narrative   Not on file   Social Determinants of Health   Financial Resource Strain: Low Risk  (09/28/2022)   [facility] from [facility]   Overall Financial Resource Strain (CARDIA)    Difficulty of Paying Living Expenses: Not hard at all  Food Insecurity: No Food Insecurity (03/15/2023)   Hunger Vital Sign    Worried About Running Out of Food in the Last Year: Never true    Ran Out of Food in the Last Year: Never true  Transportation Needs: No Transportation Needs  (03/15/2023)   PRAPARE - Administrator, Civil Service (Medical): No    Lack of Transportation (Non-Medical): No  Physical Activity: Not on file  Stress: Not on file  Social Connections: Not on file  Intimate Partner Violence: Not At Risk (03/15/2023)   Humiliation, Afraid, Rape, and Kick questionnaire    Fear of Current or Ex-Partner: No    Emotionally Abused: No    Physically Abused: No    Sexually Abused: No    FAMILY HISTORY: Family History  Problem Relation Age of Onset   Hypertension Father    Coronary artery disease Father    Alcohol abuse Father    Heart failure Brother     ALLERGIES:  is allergic to penicillins, statins, and metformin and related.  MEDICATIONS:  Current Facility-Administered Medications  Medication Dose Route Frequency Provider Last Rate Last Admin   acetaminophen (TYLENOL) tablet 650 mg  650 mg Oral Q6H PRN Mansy, Jan A, MD       Or   acetaminophen (TYLENOL) suppository 650 mg  650 mg Rectal Q6H PRN Mansy, Jan A, MD       amiodarone (PACERONE) tablet 200 mg  200 mg Oral Daily Mansy, Jan A, MD       apixaban Everlene Balls) tablet 5 mg  5 mg Oral BID Mansy, Jan A, MD   5 mg at 03/15/23 0109   ascorbic acid (VITAMIN C) tablet 500 mg  500 mg Oral Daily Mansy, Jan A, MD       budesonide (PULMICORT) nebulizer solution 0.5 mg  0.5 mg Nebulization BID Mansy, Jan A, MD   0.5 mg at 03/15/23 3235   cholecalciferol (VITAMIN D3) 25 MCG (1000 UNIT) tablet 1,000 Units  1,000 Units Oral Daily Mansy, Jan A, MD       cyanocobalamin (VITAMIN B12) tablet 5,000 mcg  5,000 mcg Oral Daily Mansy, Jan A, MD       diltiazem (CARDIZEM CD) 24 hr capsule 300 mg  300 mg Oral Daily Mansy, Jan A, MD       ezetimibe (ZETIA) tablet 10 mg  10 mg Oral Daily Mansy, Jan A, MD       ferrous sulfate tablet 325 mg  325 mg Oral Q breakfast Mansy, Jan A, MD       finasteride (PROSCAR) tablet 5 mg  5 mg Oral Daily Mansy, Jan A, MD       furosemide (LASIX) tablet 20 mg  20 mg Oral BID  PRN Mansy, Jan A, MD       gabapentin (NEURONTIN) capsule 300 mg  300 mg Oral QHS Mansy, Jan A, MD   300 mg at 03/15/23 0026   insulin aspart (novoLOG) injection 0-15  Units  0-15 Units Subcutaneous Q4H Arnetha Courser, MD   2 Units at 03/15/23 1737   insulin glargine-yfgn [facility]) injection 40 Units  40 Units Subcutaneous QHS Mansy, Jan A, MD   40 Units at 03/15/23 1610   isosorbide mononitrate (IMDUR) 24 hr tablet 60 mg  60 mg Oral Daily Mansy, Jan A, MD       levothyroxine (SYNTHROID) tablet 25 mcg  25 mcg Oral Q0600 Mansy, Jan A, MD       losartan (COZAAR) tablet 25 mg  25 mg Oral Daily Mansy, Jan A, MD       magnesium hydroxide (MILK OF MAGNESIA) suspension 30 mL  30 mL Oral Daily PRN Mansy, Jan A, MD       morphine (PF) 2 MG/ML injection 1 mg  1 mg Intravenous Q4H PRN Arnetha Courser, MD   1 mg at 03/15/23 2142   multivitamin with minerals tablet 1 tablet  1 tablet Oral Daily Mansy, Jan A, MD       nitroGLYCERIN (NITROSTAT) SL tablet 0.4 mg  0.4 mg Sublingual Q5 min PRN Mansy, Vernetta Honey, MD       nutrition supplement (JUVEN) (JUVEN) powder packet 1 packet  1 packet Oral BID BM Mansy, Jan A, MD       nystatin (MYCOSTATIN/NYSTOP) topical powder 1 Application  1 Application Topical BID Mansy, Jan A, MD   1 Application at 03/15/23 0027   ondansetron [facility]) tablet 4 mg  4 mg Oral Q6H PRN Mansy, Jan A, MD       Or   ondansetron [facility]) injection 4 mg  4 mg Intravenous Q6H PRN Mansy, Jan A, MD       pramipexole (MIRAPEX) tablet 2 mg  2 mg Oral QHS Mansy, Jan A, MD   2 mg at 03/15/23 0025   primidone (MYSOLINE) tablet 250 mg  250 mg Oral BID Mansy, Jan A, MD   250 mg at 03/15/23 0026   tamsulosin (FLOMAX) capsule 0.4 mg  0.4 mg Oral Daily Mansy, Jan A, MD       traZODone (DESYREL) tablet 25 mg  25 mg Oral QHS PRN Mansy, Jan A, MD   25 mg at 03/15/23 0025   zinc sulfate (50mg  elemental zinc) capsule 220 mg  220 mg Oral Daily Mansy, Jan A, MD        PHYSICAL EXAMINATION:   Vitals:   03/15/23 1427  03/15/23 1700  BP: 130/79 113/63  Pulse: 72 64  Resp: 17 16  Temp: (!) 97.5 F (36.4 C) (!) 97.5 F (36.4 C)  SpO2: 95% 95%   Filed Weights   03/15/23 0650  Weight: 297 lb 9.9 oz (135 kg)   Patient resting in the bed.  Accompanied by wife.  Bilateral BKA.  Physical Exam Vitals and nursing note reviewed.  HENT:     Head: Normocephalic and atraumatic.     Mouth/Throat:     Pharynx: Oropharynx is clear.  Eyes:     Extraocular Movements: Extraocular movements intact.     Pupils: Pupils are equal, round, and reactive to light.  Cardiovascular:     Rate and Rhythm: Normal rate and regular rhythm.  Pulmonary:     Comments: Decreased breath sounds bilaterally.  Abdominal:     Palpations: Abdomen is soft.  Musculoskeletal:        General: Normal range of motion.     Cervical back: Normal range of motion.  Skin:    General: Skin is warm.  Neurological:  General: No focal deficit present.     Mental Status: He is alert and oriented to person, place, and time.  Psychiatric:        Behavior: Behavior normal.        Judgment: Judgment normal.     LABORATORY DATA:  I have reviewed the data as listed Lab Results  Component Value Date   WBC 6.7 03/15/2023   HGB 11.4 (L) 03/15/2023   HCT 36.1 (L) 03/15/2023   MCV 93.8 03/15/2023   PLT 160 03/15/2023   Recent Labs    03/20/22 0439 03/21/22 0338 03/26/22 1753 05/18/22 1527 05/19/22 0531 06/06/22 0259 08/13/22 1412 03/14/23 1618 03/15/23 0402  NA 141   < > 142 134*   < > 135  --  137 138  K 3.7   < > 3.7 5.6*   < > 4.6  --  4.4 3.7  CL 109   < > 103 98   < > 103  --  98 100  CO2 28   < > 30 28   < > 26  --  30 30  GLUCOSE 110*   < > 230* 336*   < > 161*  --  193* 188*  BUN 28*   < > 18 31*   < > 49*  --  25* 27*  CREATININE 1.31*   < > 0.99 1.30*   < > 0.89 1.90* 1.27* 1.14  CALCIUM 8.1*   < > 8.6* 9.0   < > 8.8*  --  8.9 8.5*  GFRNONAA 60*   < > >60 >60   < > >60  --  >60 >60  PROT 5.5*  --  7.4 7.4  --   --    --  7.3  --   ALBUMIN 2.1*  --  2.7* 2.6*  --   --   --  3.7  --   AST 26  --  19 18  --   --   --  15  --   ALT 23  --  12 25  --   --   --  19  --   ALKPHOS 159*  --  207* 170*  --   --   --  122  --   BILITOT 0.5  --  0.6 0.3  --   --   --  0.4  --   BILIDIR 0.2  --   --   --   --   --   --   --   --   IBILI 0.3  --   --   --   --   --   --   --   --    < > = values in this interval not displayed.    RADIOGRAPHIC STUDIES: I have personally reviewed the radiological images as listed and agreed with the findings in the report. DG ESOPHAGUS W DOUBLE CM (HD)  Result Date: 03/14/2023 CLINICAL DATA:  Provided history: Essential hypertension. Dysphagia, unspecified type. Additional history: the patient reports dysphagia and vomiting for two weeks with 20 pound weight loss in this time. EXAM: ESOPHAGUS/BARIUM SWALLOW TECHNIQUE: A single contrast examination was performed using thin liquid barium. The exam was performed by Pattricia Boss, PA-C and was supervised and interpreted by Dr. Jackey Loge. FLUOROSCOPY: Radiation Exposure Index (as provided by the fluoroscopic device): 37.70 mGy Kerma COMPARISON:  CT abdomen/pelvis 08/13/2022.  Chest CT 09/10/2013. FINDINGS: Extensive filling defects throughout the mid and distal thoracic  esophagus, likely reflecting retained food and pills and markedly limiting mucosal evaluation. Markedly delayed esophageal contrast transit, with only a small amount of contrast passing into the stomach during the examination. Known small hiatal hernia (better appreciated on the prior CT abdomen/pelvis of 08/13/2022). Findings discussed with Dr. Aram Beecham by telephone at 2:40 p.m. on 03/14/2023. The patient was transported directly from the fluoroscopy suite to the emergency department. IMPRESSION: 1. Extensive filling defects throughout the mid and distal thoracic esophagus, likely reflecting retained food and pills and markedly limiting mucosal evaluation. An obstructing  distal esophageal mass or stricture cannot be excluded. Endoscopy should be considered. 2. Markedly delayed esophageal contrast transit, with only a small amount of contrast passing into the stomach during the examination. 3. Known small hiatal hernia. Electronically Signed   By: Jackey Loge D.O.   On: 03/14/2023 15:38     67 y.o. male patient with a history of smoking  CAD' A.fib [on eliquis]; DM/peripheral vascular disease status post bilateral amputations currently admitted to hospital for dysphagia noted to have Esophagus mass.   # Esophagus  mass-imaging findings highly concerning for malignancy- GI evaluation/EGD.   # mild Anemia: Likely due to GI bleed/secondary to #1  # ECOG performance status:2.  bil BKA- [sec to DM/ PVD; June 2023]- in wheel chair  # CAD/A.fib - on eliquis [Dr.Arida]  # DM- [O conell]Hb A1c- 7.2.   # Weight loss/malnutrition   PLAN/RECOMMENDATIONS:  # Recommend staging CT scan chest and pelvis.  # Regarding weight loss/malnutrition -I would recommend evaluation with surgery for jejunostomy- if PEG cannot be done given the severe esophagus stenosis.  Given patient's multiple comorbidities [although staging CAT scans are pending]-it is highly unlikely patient would be candidate for surgical resection.  Given the patient's significant local symptoms-chemoradiation would be most appropriate.  However await above scans.  Thank you Dr.Amin for allowing me to participate in the care of your pleasant patient. Please do not hesitate to contact me with questions or concerns in the interim. Discussed with Ladora Daniel.   My contact information was given to the patient; an outpatient follow-up will be arranged post discharge.Above plan of care was discussed with patient/family in detail.  # I reviewed the blood work- with the patient in detail; also reviewed the imaging independently [as summarized above]; and with the patient in detail.     [name],  MD 03/15/2023 10:39 PM

## 2023-03-15 NOTE — Progress Notes (Signed)
Arrived back from EGD at this time.  Patient is alert, oriented x4.  Orders reviewed, to remain NPO at this time.

## 2023-03-15 NOTE — Anesthesia Procedure Notes (Signed)
Procedure Name: Intubation Date/Time: 03/15/2023 12:14 PM  Performed by: Monico Hoar, CRNAPre-anesthesia Checklist: Patient identified, Patient being monitored, Timeout performed, Emergency Drugs available and Suction available Patient Re-evaluated:Patient Re-evaluated prior to induction Oxygen Delivery Method: Circle system utilized Preoxygenation: Pre-oxygenation with 100% oxygen Induction Type: IV induction Ventilation: Mask ventilation without difficulty Laryngoscope Size: 4 and McGrath Grade View: Grade I Tube type: Oral Tube size: 7.0 mm Number of attempts: 1 Airway Equipment and Method: Stylet Placement Confirmation: ETT inserted through vocal cords under direct vision, positive ETCO2 and breath sounds checked- equal and bilateral Secured at: 21 cm Tube secured with: Tape Dental Injury: Teeth and Oropharynx as per pre-operative assessment

## 2023-03-15 NOTE — Consult Note (Signed)
[name] , MD [address], [place], [place], [address] [address], [address], [place], [place], [address] Phone: [phone]  Fax: [phone]  Consultation  Referring Provider:   ER Primary Care Physician:  [name], MD Primary Gastroenterologist:  Dr. Judithann Sheen         Reason for Consultation:     Dysphagia  Date of Admission:  03/14/2023 Date of Consultation:  03/15/2023         HPI:   Alec Frame. is a 67 y.o. male presented to the ER after Dr Judithann Sheen sent him for abnormal barium swallow tests showing retained food material after he was evaluated by Dr Judithann Sheen for dysphagia , weight loss on going for a few weeks for both solids and liquids. He was tolerating his secretions and hence was admitted overnight. Says he has lost 20 lbs of weight . Presently has no pain or discomfort .   He is on eloquis.   Past Medical History:  Diagnosis Date   Arrhythmia    atrial fibrillation   Asthma    CHF (congestive heart failure) (HCC)    COPD (chronic obstructive pulmonary disease) (HCC)    Coronary artery disease    Depression    Diabetes mellitus without complication (HCC)    Gout    History anabolic steroid use    Hyperlipidemia    Hypertension    Hypogonadism in male    MI (myocardial infarction) (HCC)    Morbid obesity (HCC)    Myocardial infarction (HCC)    Peripheral vascular disease (HCC)    Perirectal abscess    Pleurisy    Sleep apnea    CPAP at night, no oxygen   Varicella     Past Surgical History:  Procedure Laterality Date   ABDOMINAL AORTIC ANEURYSM REPAIR     ACHILLES TENDON SURGERY Left 01/10/2021   Procedure: ACHILLES LENGTHENING/KIDNER;  Surgeon: Rosetta Posner, DPM;  Location: [facility];  Service: Podiatry;  Laterality: Left;   AMPUTATION Left 10/03/2021   Procedure: AMPUTATION BELOW KNEE;  Surgeon: Renford Dills, MD;  Location: [facility];  Service: Vascular;  Laterality: Left;   AMPUTATION Right 05/24/2022    Procedure: AMPUTATION BELOW KNEE;  Surgeon: Annice Needy, MD;  Location: [facility];  Service: General;  Laterality: Right;   AMPUTATION TOE Right 02/10/2016   Procedure: AMPUTATION TOE 3RD TOE;  Surgeon: Gwyneth Revels, DPM;  Location: [facility];  Service: Podiatry;  Laterality: Right;   AMPUTATION TOE Left 02/24/2020   Procedure: AMPUTATION TOE;  Surgeon: Rosetta Posner, DPM;  Location: [facility];  Service: Podiatry;  Laterality: Left;   APPLICATION OF WOUND VAC Left 02/29/2020   Procedure: APPLICATION OF WOUND VAC;  Surgeon: Rosetta Posner, DPM;  Location: [facility];  Service: Podiatry;  Laterality: Left;   CARDIAC CATHETERIZATION     CARDIOVERSION [name] 02/13/2022   Procedure: CARDIOVERSION;  Surgeon: Lamar Blinks, MD;  Location: [facility];  Service: Cardiovascular;  Laterality: [name];   COLONOSCOPY WITH PROPOFOL [name] 11/18/2015   Procedure: COLONOSCOPY WITH PROPOFOL;  Surgeon: Scot Jun, MD;  Location: [facility];  Service: Endoscopy;  Laterality: [name];   CORONARY ARTERY BYPASS GRAFT     CORONARY STENT INTERVENTION [name] 02/02/2020   Procedure: CORONARY STENT INTERVENTION;  Surgeon: Marcina Millard, MD;  Location: [facility];  Service: Cardiovascular;  Laterality: [name];   INCISION AND DRAINAGE Left 08/07/2021   Procedure: INCISION AND DRAINAGE-Partial Calcanectomy;  Surgeon: Rosetta Posner,  DPM;  Location: [facility];  Service: Podiatry;  Laterality: Left;   IRRIGATION AND DEBRIDEMENT FOOT Left 02/29/2020   Procedure: IRRIGATION AND DEBRIDEMENT FOOT;  Surgeon: Rosetta Posner, DPM;  Location: [facility];  Service: Podiatry;  Laterality: Left;   IRRIGATION AND DEBRIDEMENT FOOT Left 02/24/2020   Procedure: IRRIGATION AND DEBRIDEMENT FOOT;  Surgeon: Rosetta Posner, DPM;  Location: [facility];  Service: Podiatry;  Laterality: Left;   KNEE ARTHROSCOPY     LEFT HEART CATH AND CORS/GRAFTS ANGIOGRAPHY [name] 02/02/2020   Procedure: LEFT HEART CATH AND CORS/GRAFTS ANGIOGRAPHY;  Surgeon: Dalia Heading, MD;  Location: [facility];  Service: Cardiovascular;  Laterality: [name];   LOWER EXTREMITY ANGIOGRAPHY Left 02/25/2020   Procedure: Lower Extremity Angiography;  Surgeon: Annice Needy, MD;  Location: [facility];  Service: Cardiovascular;  Laterality: Left;   LOWER EXTREMITY ANGIOGRAPHY Left 01/04/2021   Procedure: LOWER EXTREMITY ANGIOGRAPHY;  Surgeon: Annice Needy, MD;  Location: [facility];  Service: Cardiovascular;  Laterality: Left;   LOWER EXTREMITY ANGIOGRAPHY Right 05/21/2022   Procedure: Lower Extremity Angiography;  Surgeon: Annice Needy, MD;  Location: [facility];  Service: Cardiovascular;  Laterality: Right;   METATARSAL HEAD EXCISION Left 01/10/2021   Procedure: METATARSAL HEAD EXCISION - LEFT 5th;  Surgeon: Rosetta Posner, DPM;  Location: [facility];  Service: Podiatry;  Laterality: Left;   PERIPHERAL VASCULAR CATHETERIZATION Right 01/24/2016   Procedure: Lower Extremity Angiography;  Surgeon: Renford Dills, MD;  Location: [facility];  Service: Cardiovascular;  Laterality: Right;   PERIPHERAL VASCULAR CATHETERIZATION Right 01/25/2016   Procedure: Lower Extremity Angiography;  Surgeon: Renford Dills, MD;  Location: [facility];  Service: Cardiovascular;  Laterality: Right;   TOE AMPUTATION     TONSILLECTOMY      Prior to Admission medications   Medication Sig Start Date End Date Taking? Authorizing Provider  acetaminophen (TYLENOL) 325 MG tablet Take 2 tablets (650 mg total) by mouth every 6 (six) hours as needed for mild pain (or Fever >/= 101). 05/29/22  Yes Sunnie Nielsen, DO  amiodarone (PACERONE) 200 MG tablet Take 1 tablet (200 mg total) by mouth daily. 06/20/22  Yes [name], MD  apixaban (ELIQUIS) 5 MG TABS tablet Take 5 mg by mouth 2 (two) times daily.   Yes [provider]  aspirin EC 81 MG tablet Take 81 mg by mouth daily. Swallow whole.   Yes [provider]  budesonide  (PULMICORT) 0.5 MG/2ML nebulizer solution Take 2 mLs (0.5 mg total) by nebulization 2 (two) times daily. 11/10/21  Yes Sunnie Nielsen, DO  Cholecalciferol 25 MCG (1000 UT) tablet Take 1,000 Units by mouth daily.   Yes [provider]  clonazePAM (KLONOPIN) 0.5 MG tablet Take 0.5 mg by mouth 2 (two) times daily.   Yes [provider]  Continuous Glucose Sensor (FREESTYLE LIBRE 3 SENSOR) MISC Use 1 sensor every 14 (fourteen) days. 01/10/23  Yes   Cyanocobalamin (VITAMIN B-12) 5000 MCG TBDP Take 10,000 mcg by mouth daily.   Yes [provider]  diltiazem (CARDIZEM CD) 300 MG 24 hr capsule Take 1 capsule (300 mg total) by mouth daily. 03/04/23  Yes [name], MD  ezetimibe (ZETIA) 10 MG tablet Take 10 mg by mouth at bedtime.   Yes [provider]  ferrous sulfate 325 (65 FE) MG tablet Take 325 mg by mouth daily with breakfast.   Yes [provider]  finasteride (  PROSCAR) 5 MG tablet Take 1 tablet (5 mg total) by mouth daily. 11/11/21  Yes Sunnie Nielsen, DO  furosemide (LASIX) 20 MG tablet Take 1 tablet (20 mg total) by mouth ONCE OR TWICE daily (total daily dose maximum 40 mg) as needed for increased swelling, shortness of breath, weight gain 5+ lbs over 1-2 days. Seek medical care if these symptoms are not improving with increased dose, or if you become dizzy or have low blood pressure. 05/29/22  Yes Sunnie Nielsen, DO  gabapentin (NEURONTIN) 300 MG capsule Take 300 mg by mouth at bedtime.   Yes [provider]  insulin glargine (LANTUS) 100 UNIT/ML injection Inject 15 units into the skin daily in AM and inject 25 units into the skin daily in PM 05/29/22  Yes Sunnie Nielsen, DO  isosorbide mononitrate (IMDUR) 60 MG 24 hr tablet Take 1 tablet (60 mg total) by mouth in the morning and at bedtime. 06/20/22  Yes [name], MD  levothyroxine (SYNTHROID) 25 MCG tablet Take 1 tablet (25 mcg total) by mouth daily at 6 (six) AM. 11/11/21   Yes Sunnie Nielsen, DO  losartan (COZAAR) 25 MG tablet Take 1 tablet (25 mg total) by mouth daily. 05/30/22  Yes Sunnie Nielsen, DO  Multiple Vitamins-Minerals (MULTIVITAMIN WITH MINERALS) tablet Take 1 tablet by mouth daily.   Yes [provider]  nitrofurantoin, macrocrystal-monohydrate, (MACROBID) 100 MG capsule Take 100 mg by mouth daily.   Yes [provider]  nitroGLYCERIN (NITROSTAT) 0.4 MG SL tablet Place 1 tablet (0.4 mg total) under the tongue every 5 (five) minutes as needed for chest pain. 06/20/22  Yes [name], MD  nystatin (MYCOSTATIN/NYSTOP) powder Apply 1 Application topically 2 (two) times daily.   Yes [provider]  ondansetron (ZOFRAN) 4 MG tablet Take 1 tablet (4 mg total) by mouth every 6 (six) hours as needed for nausea. 05/29/22  Yes Sunnie Nielsen, DO  pramipexole (MIRAPEX) 1 MG tablet Take 2 mg by mouth at bedtime.   Yes [provider]  primidone (MYSOLINE) 250 MG tablet Take 250 mg by mouth 2 (two) times daily. 01/23/20  Yes [provider]  tamsulosin (FLOMAX) 0.4 MG CAPS capsule Take 0.4 mg by mouth daily.   Yes [provider]  vitamin C (ASCORBIC ACID) 500 MG tablet Take 500 mg by mouth daily.   Yes [provider]  zinc sulfate 220 (50 Zn) MG capsule Take 1 capsule (220 mg total) by mouth daily. 05/29/22  Yes Sunnie Nielsen, DO  Continuous Blood Gluc Sensor (FREESTYLE LIBRE 3 SENSOR) MISC  06/13/22   [provider]  HUMALOG 100 UNIT/ML injection Inject 15 Units into the skin 3 (three) times daily with meals. >200 02/05/22   [provider]  Infant Care Products [facility]) OINT Apply 1 Application topically daily as needed.    [provider]  mupirocin ointment (BACTROBAN) 2 % Apply 1 Application topically daily. 12/13/22   Georgiana Spinner, NP  nutrition supplement, JUVEN, (JUVEN) PACK Take 1 packet by mouth 2 (two) times daily between meals. 05/29/22    Sunnie Nielsen, DO  risperiDONE (RISPERDAL) 0.5 MG tablet Take 0.5 tablets (0.25 mg total) by mouth 2 (two) times daily. Patient not taking: Reported on 03/14/2023 05/29/22   Sunnie Nielsen, DO  sulfamethoxazole-trimethoprim (BACTRIM DS) 800-160 MG tablet Take 1 tablet by mouth 2 (two) times daily. Patient not taking: Reported on 03/14/2023 12/13/22   Georgiana Spinner, NP    Family History  Problem  Relation Age of Onset   Hypertension Father    Coronary artery disease Father    Alcohol abuse Father    Heart failure Brother      Social History   Tobacco Use   Smoking status: Former    Current packs/day: 0.00    Average packs/day: 0.5 packs/day for 45.0 years (22.5 ttl pk-yrs)    Types: Cigarettes    Start date: 04/04/1970    Quit date: 04/05/2015    Years since quitting: 7.9   Smokeless tobacco: Never  Vaping Use   Vaping status: Every Day  Substance Use Topics   Alcohol use: Not Currently    Alcohol/week: 3.0 standard drinks of alcohol    Types: 3 Glasses of wine per week   Drug use: No    Allergies as of 03/14/2023 - Review Complete 03/14/2023  Allergen Reaction Noted   Penicillins Other (See Comments) 08/07/2021   Statins  03/19/2019   Metformin and related Diarrhea 03/15/2022    Review of Systems:    All systems reviewed and negative except where noted in HPI.   Physical Exam:  Vital signs in last 24 hours: Temp:  [98 F (36.7 C)-98.3 F (36.8 C)] 98 F (36.7 C) (11/15 0803) Pulse Rate:  [69-85] 71 (11/15 0803) Resp:  [14-20] 14 (11/15 0803) BP: (85-133)/(47-82) 121/64 (11/15 0803) SpO2:  [86 %-98 %] 95 % (11/15 0803) Weight:  [135 kg] 135 kg (11/15 0650) Last BM Date : 03/14/23 General:   Pleasant, cooperative in NAD Head:  Normocephalic and atraumatic. Eyes:   No icterus.   Conjunctiva pink. PERRLA. Ears:  Normal auditory acuity. Neck:  Supple; no masses or thyroidomegaly Lungs: Respirations even and unlabored. Lungs clear to auscultation  bilaterally.   No wheezes, crackles, or rhonchi.  Heart:  Regular rate and rhythm;  Without murmur, clicks, rubs or gallops Abdomen:  Soft, nondistended, nontender. Normal bowel sounds. No appreciable masses or hepatomegaly.  No rebound or guarding.  Neurologic:  Alert and oriented x3;  grossly normal neurologically. Psych:  Alert and cooperative. Normal affect.  LAB RESULTS: Recent Labs    03/14/23 1618 03/15/23 0402  WBC 7.3 6.7  HGB 13.0 11.4*  HCT 42.4 36.1*  PLT 170 160   BMET Recent Labs    03/14/23 1618 03/15/23 0402  NA 137 138  K 4.4 3.7  CL 98 100  CO2 30 30  GLUCOSE 193* 188*  BUN 25* 27*  CREATININE 1.27* 1.14  CALCIUM 8.9 8.5*   LFT Recent Labs    03/14/23 1618  PROT 7.3  ALBUMIN 3.7  AST 15  ALT 19  ALKPHOS 122  BILITOT 0.4   PT/INR No results for input(s): "LABPROT", "INR" in the last 72 hours.  STUDIES: DG ESOPHAGUS W DOUBLE CM (HD)  Result Date: 03/14/2023 CLINICAL DATA:  Provided history: Essential hypertension. Dysphagia, unspecified type. Additional history: the patient reports dysphagia and vomiting for two weeks with 20 pound weight loss in this time. EXAM: ESOPHAGUS/BARIUM SWALLOW TECHNIQUE: A single contrast examination was performed using thin liquid barium. The exam was performed by Pattricia Boss, PA-C and was supervised and interpreted by Dr. Jackey Loge. FLUOROSCOPY: Radiation Exposure Index (as provided by the fluoroscopic device): 37.70 mGy Kerma COMPARISON:  CT abdomen/pelvis 08/13/2022.  Chest CT 09/10/2013. FINDINGS: Extensive filling defects throughout the mid and distal thoracic esophagus, likely reflecting retained food and pills and markedly limiting mucosal evaluation. Markedly delayed esophageal contrast transit, with only a small amount of contrast passing into the  stomach during the examination. Known small hiatal hernia (better appreciated on the prior CT abdomen/pelvis of 08/13/2022). Findings discussed with Dr. Aram Beecham  by telephone at 2:40 p.m. on 03/14/2023. The patient was transported directly from the fluoroscopy suite to the emergency department. IMPRESSION: 1. Extensive filling defects throughout the mid and distal thoracic esophagus, likely reflecting retained food and pills and markedly limiting mucosal evaluation. An obstructing distal esophageal mass or stricture cannot be excluded. Endoscopy should be considered. 2. Markedly delayed esophageal contrast transit, with only a small amount of contrast passing into the stomach during the examination. 3. Known small hiatal hernia. Electronically Signed   By: Jackey Loge D.O.   On: 03/14/2023 15:38      Impression / Plan:   Alec Hamson. is a 67 y.o. y/o male with a history of iron deficiency anemia, weight loss, smoking history and dysphagia presents after barium swallow showed retained food in the esophagus. On eloquis. History concerning for neoplasm   Plan  EGD  I have discussed alternative options, risks & benefits,  which include, but are not limited to, bleeding, infection, perforation,respiratory complication & drug reaction.  The patient agrees with this plan & written consent will be obtained.     Thank you for involving me in the care of this patient.      LOS: 1 day   Alec Mood, MD  03/15/2023, 8:20 AM

## 2023-03-15 NOTE — Assessment & Plan Note (Signed)
Occluding esophageal mass. S/p EGD which shows some food impaction which was removed.  Also found to have a large occluding mass at GE junction.  Biopsy were taken, highly concerning for malignancy. GI is concerned that patient will need long-term TPN as he will not be a candidate for PEG tube placement due to occlusion of esophagus. -Oncology was also consulted -Follow-up biopsy results and further management per oncology

## 2023-03-15 NOTE — Progress Notes (Signed)
Initial Nutrition Assessment  DOCUMENTATION CODES:   Obesity unspecified  INTERVENTION:   -RD will follow for goals of care and make further recommendations  NUTRITION DIAGNOSIS:   Inadequate oral intake related to inability to eat as evidenced by NPO status.  GOAL:   Patient will meet greater than or equal to 90% of their needs  MONITOR:   Labs, Weight trends, I & O's, Skin  REASON FOR ASSESSMENT:   Malnutrition Screening Tool    ASSESSMENT:   Pt with medical history significant for asthma, COPD, CHF, coronary artery disease, depression, gout, type 2 diabetes mellitus, hypertension, dyslipidemia, PVD and OSA on CPAP, who presented with acute onset of dysphagia which has been going on over the last several weeks including both solids and liquids however he was able to drink small sips of water and has been tolerating his secretions.  Pt admitted with dysphagia likely related to esophageal dysmotility secondary to possible distal esophageal mass or stricture.    11/15- s/p EGD- food removal in lower third of esophagus, malignant appearing esophageal stenosis, completely obstructing, esophageal tumor in lowe third of esophagus (biopsied)   Per GI notes, pt with dysphagia with solids and liquids and weight loss (20# per pt report) over the past 3 weeks.   GI reports plan for continued NPO secondary to esophagus severely stenosed secondary to mass. GI also concerned that pt may require TPN due to not being a PEG candidate due to occluded esophagus. Oncology consult also pending.   Attempted to speak with pt multiple times today, however, down in endo suite this morning. When attempted to meet with pt later, pt wife in room, very upset, and RN was trying to console her. Interview and exam deferred at this time.   Reviewed wt hx; no wt loss noted over the past 10 months.   Medications reviewed and include vitamin C, vitamin D3, vitamin B-12, cardizem, ferrous sulfate daily,  neurontin, zinc sulfate, and 0.9% sodium chloride infusion @ 40 ml/hr.   Lab Results  Component Value Date   HGBA1C 11.2 (H) 05/22/2022   PTA DM medications are 15 units insulin lispro TID, 15 units insulin glargine daily, and 25 units insulin glargine daily.   Labs reviewed: CBGS: 174 (inpatient orders for glycemic control are 40 units insulin glargine daily.    Diet Order:   Diet Order             Diet NPO time specified  Diet effective now                   EDUCATION NEEDS:   No education needs have been identified at this time  Skin:  Skin Assessment: Reviewed RN Assessment  Last BM:  03/14/23  Height:   Ht Readings from Last 1 Encounters:  03/15/23 6\' 4"  (1.93 m)    Weight:   Wt Readings from Last 1 Encounters:  03/15/23 135 kg    Ideal Body Weight:  86.4 kg  BMI:  Body mass index is 36.23 kg/m.  Estimated Nutritional Needs:   Kcal:  2200-2400  Protein:  115-130 grams  Fluid:  > 2 L    Levada Schilling, RD, LDN, CDCES Registered Dietitian III Certified Diabetes Care and Education Specialist Please refer to [facility] for RD and/or RD on-call/weekend/after hours pager

## 2023-03-15 NOTE — Anesthesia Preprocedure Evaluation (Signed)
Anesthesia Evaluation  Patient identified by MRN, date of birth, ID band Patient awake    Reviewed: Allergy & Precautions, NPO status , Patient's Chart, lab work & pertinent test results  Airway Mallampati: II  TM Distance: >3 FB     Dental  (+) Edentulous Upper, Missing, Poor Dentition, Dental Advisory Given   Pulmonary asthma , sleep apnea , COPD,  COPD inhaler, former smoker    + decreased breath sounds      Cardiovascular Exercise Tolerance: Poor hypertension, Pt. on medications + CAD, + Past MI, + Cardiac Stents, + Peripheral Vascular Disease and +CHF   Rhythm:Regular Rate:Normal     Neuro/Psych    GI/Hepatic negative GI ROS, Neg liver ROS,,,  Endo/Other  diabetes, Type 1, Insulin Dependent  Class 3 obesity  Renal/GU      Musculoskeletal   Abdominal  (+) + obese  Peds negative pediatric ROS (+)  Hematology   Anesthesia Other Findings   Reproductive/Obstetrics                             Anesthesia Physical Anesthesia Plan  ASA: 3  Anesthesia Plan: General   Post-op Pain Management:    Induction: Intravenous  PONV Risk Score and Plan:   Airway Management Planned: Natural Airway and Nasal Cannula  Additional Equipment:   Intra-op Plan:   Post-operative Plan:   Informed Consent:   Plan Discussed with:   Anesthesia Plan Comments:        Anesthesia Quick Evaluation

## 2023-03-15 NOTE — Progress Notes (Signed)
Patient wife/ [name] is very upset that Dr. Nelson Chimes is the attending taking care of her husband.  She states that there were issues with Dr. Nelson Chimes during prior hospitalizations.  Notified Dr. Nelson Chimes via telephone about patient and family concerns.     03/15/23 1345  Provider Notification  Provider Name/Title Dr. Amin/ Attending  Date Provider Notified 03/15/23  Time Provider Notified 1343  Method of Notification Call and Epic Secure Chat    Notification Reason Requested by patient/family  Provider response Evaluate remotely;No new orders  Date of Provider Response 03/15/23  Time of Provider Response 1350

## 2023-03-15 NOTE — Progress Notes (Signed)
Progress Note   Patient: [name]. ZOX:[id number] DOB: Jan 14, 1956 DOA: 03/14/2023     1 DOS: the patient was seen and examined on 03/15/2023   Brief hospital course: Taken from H&P.  Dathan A Kempen Montez Hageman. is a 67 y.o. Caucasian male with medical history significant for asthma, COPD, CHF, coronary artery disease, depression, gout, type 2 diabetes mellitus, hypertension, dyslipidemia, PVD and OSA on CPAP, who presented to the emergency room with dysphagia which has been going on over the last several weeks including both solids and liquids however he was able to drink small sips of water and has been tolerating his secretions.  Patient ate a large piece of bagel before starting his symptoms.  On presentation vital stable, labs with BUN of 25, creatinine 1.27, CABG 193, CBC normal Outpatient barium swallow today revealed the following: 1. Extensive filling defects throughout the mid and distal thoracic esophagus, likely reflecting retained food and pills and markedly limiting mucosal evaluation. An obstructing distal esophageal mass or stricture cannot be excluded. Endoscopy should be considered. 2. Markedly delayed esophageal contrast transit, with only a small amount of contrast passing into the stomach during the examination. 3. Known small hiatal hernia.  Gastroenterology was consulted and patient will be going for EGD on 11/15.  11/15: Vital stable, creatinine improved to 1.14.  EGD today with food impaction which was removed and found to have large occluding mass underneath at GE junction.  Biopsies were taken. Per GI patient likely will need TPN as he will not be a candidate for PEG placement due to occluded esophagus.  Oncology was also consulted.  Called wife to give her an update as there was no one present in the room when patient was seen.  She suddenly started shouting without listening to anything that you are troublemaker and you are the one who are keep repeating my  husband apart, but makes me very confused.  Then she told the nurse that I cause a lot of trouble to him in the past and she does not want me to take care of him.  Found 1 note done in April 2023 that she had the similar behavior with me and started shouting and shoveling me out of the room and keeps saying that she does not want my color or accent at that time.  TOC and nursing supervisor was informed at that time and patient was taken care by a different provider. Discussed with our DOC, Dr. Allena Katz will take over from tomorrow.   Assessment and Plan: * Dysphagia S/p EGD which shows some food impaction which was removed.  Also found to have a large occluding mass at GE junction.  Biopsy were taken, highly concerning for malignancy. GI is concerned that patient will need long-term TPN as he will not be a candidate for PEG tube placement due to occlusion of esophagus. -Oncology was also consulted -Follow-up biopsy results and further management per oncology  AKI (acute kidney injury) (HCC) - This is likely all due to diminished p.o. intake. Creatinine improved to 1.14 with IV fluid. -Continue with gentle IV fluid as patient might not be able to take p.o.  Hypothyroidism - We will continue Synthroid.  Essential hypertension - We will resume his antihypertensive therapy while holding off nephrotoxins.  PAD (peripheral artery disease) (HCC) S/p bilateral BKA. Not on any antiplatelet or statin at home  Atrial fibrillation, chronic (HCC) - We will continue Cardizem CD and amiodarone as well as close.  Type 2 diabetes mellitus  with peripheral neuropathy (HCC) - Patient will be placed on supplemental coverage with NovoLog. - We will continue basal coverage. - We will continue Neurontin.  BPH (benign prostatic hyperplasia) - We will continue Flomax.  Coronary artery disease - We will continue Imdur and as needed sublingual nitroglycerin.      Subjective: Patient was lying comfortably  when seen today.  Denies any pain.  Awaiting EGD.  Physical Exam: Vitals:   03/15/23 1300 03/15/23 1310 03/15/23 1313 03/15/23 1320  BP: 107/74 108/78 110/70 110/72  Pulse: 72 69 69 68  Resp: 16 14 16 15   Temp:      TempSrc:      SpO2: (!) 84% 95% 93% 93%  Weight:      Height:       General.  Obese gentleman, in no acute distress. Pulmonary.  Lungs clear bilaterally, normal respiratory effort. CV.  Regular rate and rhythm, no JVD, rub or murmur. Abdomen.  Soft, nontender, nondistended, BS positive. CNS.  Alert and oriented .  No focal neurologic deficit. Extremities.  Bilateral BKA Psychiatry.  Judgment and insight appears normal.   Data Reviewed: Prior data reviewed  Family Communication: Called wife give the update-please see above about the conversation.  Disposition: Status is: Inpatient Remains inpatient appropriate because: Severity of illness  Planned Discharge Destination: Home  DVT prophylaxis.  Eliquis Time spent: 45 minutes  This record has been created using Conservation officer, historic buildings. Errors have been sought and corrected,but may not always be located. Such creation errors do not reflect on the standard of care.   Author: Arnetha Courser, MD 03/15/2023 2:13 PM  For on call review [email or web address].

## 2023-03-15 NOTE — Hospital Course (Addendum)
Taken from H&P.  Matthews A Koppelman Montez Hageman. is a 67 y.o. Caucasian male with medical history significant for asthma, COPD, CHF, coronary artery disease, depression, gout, type 2 diabetes mellitus, hypertension, dyslipidemia, PVD and OSA on CPAP, who presented to the emergency room with dysphagia which has been going on over the last several weeks including both solids and liquids however he was able to drink small sips of water and has been tolerating his secretions.  Patient ate a large piece of bagel before starting his symptoms.  On presentation vital stable, labs with BUN of 25, creatinine 1.27, CABG 193, CBC normal Outpatient barium swallow today revealed the following: 1. Extensive filling defects throughout the mid and distal thoracic esophagus, likely reflecting retained food and pills and markedly limiting mucosal evaluation. An obstructing distal esophageal mass or stricture cannot be excluded. Endoscopy should be considered. 2. Markedly delayed esophageal contrast transit, with only a small amount of contrast passing into the stomach during the examination. 3. Known small hiatal hernia.  Gastroenterology was consulted and patient will be going for EGD on 11/15.  11/15: Vital stable, creatinine improved to 1.14.  EGD today with food impaction which was removed and found to have large occluding mass underneath at GE junction.  Biopsies were taken. Per GI patient likely will need TPN as he will not be a candidate for PEG placement due to occluded esophagus.  Oncology was also consulted.  Called wife to give her an update as there was no one present in the room when patient was seen.  She suddenly started shouting without listening to anything that you are troublemaker and you are the one who are keep repeating my husband apart, but makes me very confused.  Then she told the nurse that I cause a lot of trouble to him in the past and she does not want me to take care of him.  Found 1 note done in  April 2023 that she had the similar behavior with me and started shouting and shoveling me out of the room and keeps saying that she does not want my color or accent at that time.  TOC and nursing supervisor was informed at that time and patient was taken care by a different provider. Discussed with our DOC, Dr. Allena Katz will take over from tomorrow.

## 2023-03-15 NOTE — H&P (Signed)
Wyline Mood, MD [address], [address], [place], [place], [address], [address], [place], [place], [address] Phone: [phone]  Fax: [phone]  Primary Care Physician:  [name], MD   Pre-Procedure History & Physical: HPI:  Etsel Shed. is a 67 y.o. male is here for an endoscopy    Past Medical History:  Diagnosis Date   Arrhythmia    atrial fibrillation   Asthma    CHF (congestive heart failure) (HCC)    COPD (chronic obstructive pulmonary disease) (HCC)    Coronary artery disease    Depression    Diabetes mellitus without complication (HCC)    Gout    History anabolic steroid use    Hyperlipidemia    Hypertension    Hypogonadism in male    MI (myocardial infarction) (HCC)    Morbid obesity (HCC)    Myocardial infarction (HCC)    Peripheral vascular disease (HCC)    Perirectal abscess    Pleurisy    Sleep apnea    CPAP at night, no oxygen   Varicella     Past Surgical History:  Procedure Laterality Date   ABDOMINAL AORTIC ANEURYSM REPAIR     ACHILLES TENDON SURGERY Left 01/10/2021   Procedure: ACHILLES LENGTHENING/KIDNER;  Surgeon: Rosetta Posner, DPM;  Location: [facility];  Service: Podiatry;  Laterality: Left;   AMPUTATION Left 10/03/2021   Procedure: AMPUTATION BELOW KNEE;  Surgeon: Renford Dills, MD;  Location: [facility];  Service: Vascular;  Laterality: Left;   AMPUTATION Right 05/24/2022   Procedure: AMPUTATION BELOW KNEE;  Surgeon: Annice Needy, MD;  Location: [facility];  Service: General;  Laterality: Right;   AMPUTATION TOE Right 02/10/2016   Procedure: AMPUTATION TOE 3RD TOE;  Surgeon: Gwyneth Revels, DPM;  Location: [facility];  Service: Podiatry;  Laterality: Right;   AMPUTATION TOE Left 02/24/2020   Procedure: AMPUTATION TOE;  Surgeon: Rosetta Posner, DPM;  Location: [facility];  Service: Podiatry;  Laterality: Left;   APPLICATION OF WOUND VAC Left 02/29/2020   Procedure: APPLICATION OF WOUND VAC;  Surgeon: Rosetta Posner, DPM;  Location: [facility];  Service: Podiatry;  Laterality: Left;   CARDIAC CATHETERIZATION     CARDIOVERSION [name] 02/13/2022   Procedure: CARDIOVERSION;  Surgeon: Lamar Blinks, MD;  Location: [facility];  Service: Cardiovascular;  Laterality: [name];   COLONOSCOPY WITH PROPOFOL [name] 11/18/2015   Procedure: COLONOSCOPY WITH PROPOFOL;  Surgeon: Scot Jun, MD;  Location: [facility];  Service: Endoscopy;  Laterality: [name];   CORONARY ARTERY BYPASS GRAFT     CORONARY STENT INTERVENTION [name] 02/02/2020   Procedure: CORONARY STENT INTERVENTION;  Surgeon: Marcina Millard, MD;  Location: [facility];  Service: Cardiovascular;  Laterality: [name];   INCISION AND DRAINAGE Left 08/07/2021   Procedure: INCISION AND DRAINAGE-Partial Calcanectomy;  Surgeon: Rosetta Posner, DPM;  Location: [facility];  Service: Podiatry;  Laterality: Left;   IRRIGATION AND DEBRIDEMENT FOOT Left 02/29/2020   Procedure: IRRIGATION AND DEBRIDEMENT FOOT;  Surgeon: Rosetta Posner, DPM;  Location: [facility];  Service: Podiatry;  Laterality: Left;   IRRIGATION AND DEBRIDEMENT FOOT Left 02/24/2020   Procedure: IRRIGATION AND DEBRIDEMENT FOOT;  Surgeon: Rosetta Posner, DPM;  Location: [facility];  Service: Podiatry;  Laterality: Left;   KNEE ARTHROSCOPY     LEFT HEART CATH AND CORS/GRAFTS ANGIOGRAPHY [name] 02/02/2020   Procedure: LEFT HEART CATH AND CORS/GRAFTS ANGIOGRAPHY;  Surgeon: Dalia Heading, MD;  Location: [facility];  Service: Cardiovascular;  Laterality: [name];   LOWER EXTREMITY ANGIOGRAPHY Left 02/25/2020   Procedure: Lower Extremity Angiography;  Surgeon: Annice Needy, MD;  Location: [facility];  Service: Cardiovascular;  Laterality: Left;   LOWER EXTREMITY ANGIOGRAPHY Left 01/04/2021   Procedure: LOWER EXTREMITY ANGIOGRAPHY;  Surgeon: Annice Needy, MD;  Location: [facility];  Service: Cardiovascular;  Laterality: Left;   LOWER EXTREMITY ANGIOGRAPHY Right 05/21/2022   Procedure:  Lower Extremity Angiography;  Surgeon: Annice Needy, MD;  Location: [facility];  Service: Cardiovascular;  Laterality: Right;   METATARSAL HEAD EXCISION Left 01/10/2021   Procedure: METATARSAL HEAD EXCISION - LEFT 5th;  Surgeon: Rosetta Posner, DPM;  Location: [facility];  Service: Podiatry;  Laterality: Left;   PERIPHERAL VASCULAR CATHETERIZATION Right 01/24/2016   Procedure: Lower Extremity Angiography;  Surgeon: Renford Dills, MD;  Location: [facility];  Service: Cardiovascular;  Laterality: Right;   PERIPHERAL VASCULAR CATHETERIZATION Right 01/25/2016   Procedure: Lower Extremity Angiography;  Surgeon: Renford Dills, MD;  Location: [facility];  Service: Cardiovascular;  Laterality: Right;   TOE AMPUTATION     TONSILLECTOMY      Prior to Admission medications   Medication Sig Start Date End Date Taking? Authorizing Provider  acetaminophen (TYLENOL) 325 MG tablet Take 2 tablets (650 mg total) by mouth every 6 (six) hours as needed for mild pain (or Fever >/= 101). 05/29/22  Yes Sunnie Nielsen, DO  amiodarone (PACERONE) 200 MG tablet Take 1 tablet (200 mg total) by mouth daily. 06/20/22  Yes [name], MD  apixaban (ELIQUIS) 5 MG TABS tablet Take 5 mg by mouth 2 (two) times daily.   Yes [provider]  aspirin EC 81 MG tablet Take 81 mg by mouth daily. Swallow whole.   Yes [provider]  budesonide (PULMICORT) 0.5 MG/2ML nebulizer solution Take 2 mLs (0.5 mg total) by nebulization 2 (two) times daily. 11/10/21  Yes Sunnie Nielsen, DO  Cholecalciferol 25 MCG (1000 UT) tablet Take 1,000 Units by mouth daily.   Yes [provider]  clonazePAM (KLONOPIN) 0.5 MG tablet Take 0.5 mg by mouth 2 (two) times daily.   Yes [provider]  Continuous Glucose Sensor (FREESTYLE LIBRE 3 SENSOR) MISC Use 1 sensor every 14 (fourteen) days. 01/10/23  Yes   Cyanocobalamin (VITAMIN B-12) 5000 MCG TBDP Take 10,000 mcg by mouth  daily.   Yes [provider]  diltiazem (CARDIZEM CD) 300 MG 24 hr capsule Take 1 capsule (300 mg total) by mouth daily. 03/04/23  Yes [name], MD  ezetimibe (ZETIA) 10 MG tablet Take 10 mg by mouth at bedtime.   Yes [provider]  ferrous sulfate 325 (65 FE) MG tablet Take 325 mg by mouth daily with breakfast.   Yes [provider]  finasteride (PROSCAR) 5 MG tablet Take 1 tablet (5 mg total) by mouth daily. 11/11/21  Yes Sunnie Nielsen, DO  furosemide (LASIX) 20 MG tablet Take 1 tablet (20 mg total) by mouth ONCE OR TWICE daily (total daily dose maximum 40 mg) as needed for increased swelling, shortness of breath, weight gain 5+ lbs over 1-2 days. Seek medical care if these symptoms are not improving with increased dose, or if you become dizzy or have low blood pressure. 05/29/22  Yes Sunnie Nielsen, DO  gabapentin (NEURONTIN) 300 MG capsule Take 300 mg by mouth at bedtime.   Yes [provider]  insulin glargine (LANTUS)  100 UNIT/ML injection Inject 15 units into the skin daily in AM and inject 25 units into the skin daily in PM 05/29/22  Yes Sunnie Nielsen, DO  isosorbide mononitrate (IMDUR) 60 MG 24 hr tablet Take 1 tablet (60 mg total) by mouth in the morning and at bedtime. 06/20/22  Yes [name], MD  levothyroxine (SYNTHROID) 25 MCG tablet Take 1 tablet (25 mcg total) by mouth daily at 6 (six) AM. 11/11/21  Yes Sunnie Nielsen, DO  losartan (COZAAR) 25 MG tablet Take 1 tablet (25 mg total) by mouth daily. 05/30/22  Yes Sunnie Nielsen, DO  Multiple Vitamins-Minerals (MULTIVITAMIN WITH MINERALS) tablet Take 1 tablet by mouth daily.   Yes [provider]  nitrofurantoin, macrocrystal-monohydrate, (MACROBID) 100 MG capsule Take 100 mg by mouth daily.   Yes [provider]  nitroGLYCERIN (NITROSTAT) 0.4 MG SL tablet Place 1 tablet (0.4 mg total) under the tongue every 5 (five) minutes as needed for chest pain.  06/20/22  Yes [name], MD  nystatin (MYCOSTATIN/NYSTOP) powder Apply 1 Application topically 2 (two) times daily.   Yes [provider]  ondansetron (ZOFRAN) 4 MG tablet Take 1 tablet (4 mg total) by mouth every 6 (six) hours as needed for nausea. 05/29/22  Yes Sunnie Nielsen, DO  pramipexole (MIRAPEX) 1 MG tablet Take 2 mg by mouth at bedtime.   Yes [provider]  primidone (MYSOLINE) 250 MG tablet Take 250 mg by mouth 2 (two) times daily. 01/23/20  Yes [provider]  tamsulosin (FLOMAX) 0.4 MG CAPS capsule Take 0.4 mg by mouth daily.   Yes [provider]  vitamin C (ASCORBIC ACID) 500 MG tablet Take 500 mg by mouth daily.   Yes [provider]  zinc sulfate 220 (50 Zn) MG capsule Take 1 capsule (220 mg total) by mouth daily. 05/29/22  Yes Sunnie Nielsen, DO  Continuous Blood Gluc Sensor (FREESTYLE LIBRE 3 SENSOR) MISC  06/13/22   [provider]  HUMALOG 100 UNIT/ML injection Inject 15 Units into the skin 3 (three) times daily with meals. >200 02/05/22   [provider]  Infant Care Products [facility]) OINT Apply 1 Application topically daily as needed.    [provider]  mupirocin ointment (BACTROBAN) 2 % Apply 1 Application topically daily. 12/13/22   Georgiana Spinner, NP  nutrition supplement, JUVEN, (JUVEN) PACK Take 1 packet by mouth 2 (two) times daily between meals. 05/29/22   Sunnie Nielsen, DO  risperiDONE (RISPERDAL) 0.5 MG tablet Take 0.5 tablets (0.25 mg total) by mouth 2 (two) times daily. Patient not taking: Reported on 03/14/2023 05/29/22   Sunnie Nielsen, DO  sulfamethoxazole-trimethoprim (BACTRIM DS) 800-160 MG tablet Take 1 tablet by mouth 2 (two) times daily. Patient not taking: Reported on 03/14/2023 12/13/22   Georgiana Spinner, NP    Allergies as of 03/14/2023 - Review Complete 03/14/2023  Allergen Reaction Noted   Penicillins Other (See Comments) 08/07/2021   Statins  03/19/2019    Metformin and related Diarrhea 03/15/2022    Family History  Problem Relation Age of Onset   Hypertension Father    Coronary artery disease Father    Alcohol abuse Father    Heart failure Brother     Social History   Socioeconomic History   Marital status: Married    Spouse name: Not on file   Number of children: Not on file   Years of education: Not on file   Highest education level: Not on file  Occupational History   Not on file  Tobacco Use   Smoking status: Former    Current packs/day: 0.00    Average packs/day: 0.5 packs/day for 45.0 years (22.5 ttl pk-yrs)    Types: Cigarettes    Start date: 04/04/1970    Quit date: 04/05/2015    Years since quitting: 7.9   Smokeless tobacco: Never  Vaping Use   Vaping status: Every Day  Substance and Sexual Activity   Alcohol use: Not Currently    Alcohol/week: 3.0 standard drinks of alcohol    Types: 3 Glasses of wine per week   Drug use: No   Sexual activity: Not on file  Other Topics Concern   Not on file  Social History Narrative   Not on file   Social Determinants of Health   Financial Resource Strain: Low Risk  (09/28/2022)   [facility], [facility]   Overall Financial Resource Strain (CARDIA)    Difficulty of Paying Living Expenses: Not hard at all  Food Insecurity: No Food Insecurity (03/15/2023)   Hunger Vital Sign    Worried About Running Out of Food in the Last Year: Never true    Ran Out of Food in the Last Year: Never true  Transportation Needs: No Transportation Needs (03/15/2023)   PRAPARE - Administrator, Civil Service (Medical): No    Lack of Transportation (Non-Medical): No  Physical Activity: Not on file  Stress: Not on file  Social Connections: Not on file  Intimate Partner Violence: Not At Risk (03/15/2023)   Humiliation, Afraid, Rape, and Kick questionnaire    Fear of Current or Ex-Partner: No    Emotionally Abused: No     Physically Abused: No    Sexually Abused: No    Review of Systems: See HPI, otherwise negative ROS  Physical Exam: BP 125/71   Pulse 63   Temp (!) 97.3 F (36.3 C) (Temporal)   Resp 16   Ht 6\' 4"  (1.93 m)   Wt 135 kg   SpO2 94%   BMI 36.23 kg/m  General:   Alert,  pleasant and cooperative in NAD Head:  Normocephalic and atraumatic. Neck:  Supple; no masses or thyromegaly. Lungs:  Clear throughout to auscultation, normal respiratory effort.    Heart:  +S1, +S2, Regular rate and rhythm, No edema. Abdomen:  Soft, nontender and nondistended. Normal bowel sounds, without guarding, and without rebound.   Neurologic:  Alert and  oriented x4;   Impression/Plan: [name] is here for an endoscopy  to be performed for  evaluation of dysphagia    Risks, benefits, limitations, and alternatives regarding endoscopy have been reviewed with the patient.  Questions have been answered.  All parties agreeable.   Wyline Mood, MD  03/15/2023, 12:06 PM

## 2023-03-15 NOTE — Plan of Care (Signed)
Alert and oriented x4. EGD completed today. Overwhelmed with possible diagnosis. Remains NPO. CBG monitoring initiated per patient and wife request.   Problem: Education: Goal: Knowledge of General Education information will improve Description: Including pain rating scale, medication(s)/side effects and non-pharmacologic comfort measures Outcome: Progressing   Problem: Health Behavior/Discharge Planning: Goal: Ability to manage health-related needs will improve Outcome: Progressing   Problem: Clinical Measurements: Goal: Ability to maintain clinical measurements within normal limits will improve Outcome: Progressing Goal: Will remain free from infection Outcome: Progressing Goal: Diagnostic test results will improve Outcome: Progressing

## 2023-03-16 DIAGNOSIS — I739 Peripheral vascular disease, unspecified: Secondary | ICD-10-CM | POA: Diagnosis not present

## 2023-03-16 DIAGNOSIS — R1319 Other dysphagia: Secondary | ICD-10-CM | POA: Diagnosis not present

## 2023-03-16 DIAGNOSIS — I482 Chronic atrial fibrillation, unspecified: Secondary | ICD-10-CM | POA: Diagnosis not present

## 2023-03-16 DIAGNOSIS — K2289 Other specified disease of esophagus: Secondary | ICD-10-CM | POA: Diagnosis not present

## 2023-03-16 DIAGNOSIS — I10 Essential (primary) hypertension: Secondary | ICD-10-CM | POA: Diagnosis not present

## 2023-03-16 LAB — GLUCOSE, CAPILLARY
Glucose-Capillary: 102 mg/dL — ABNORMAL HIGH (ref 70–99)
Glucose-Capillary: 103 mg/dL — ABNORMAL HIGH (ref 70–99)
Glucose-Capillary: 105 mg/dL — ABNORMAL HIGH (ref 70–99)
Glucose-Capillary: 87 mg/dL (ref 70–99)
Glucose-Capillary: 83 mg/dL (ref 70–99)

## 2023-03-16 MED ORDER — ENOXAPARIN SODIUM 150 MG/ML IJ SOSY
1.0000 mg/kg | PREFILLED_SYRINGE | Freq: Two times a day (BID) | INTRAMUSCULAR | Status: AC
Start: 2023-03-16 — End: 2023-03-17
  Administered 2023-03-16 – 2023-03-17 (×3): 135 mg via SUBCUTANEOUS
  Filled 2023-03-16 (×3): qty 0.9

## 2023-03-16 MED ORDER — BUDESONIDE 0.5 MG/2ML IN SUSP
0.5000 mg | Freq: Two times a day (BID) | RESPIRATORY_TRACT | Status: DC
  Administered 2023-03-16 – 2023-03-22 (×12): 0.5 mg via RESPIRATORY_TRACT
  Filled 2023-03-16 (×12): qty 2

## 2023-03-16 NOTE — Progress Notes (Signed)
[name].   DOB:[birth year]   NW#:[id number]    Subjective: Patient resting in the bed.  Denies any pain.  Continues to have difficulty swallowing.  On IV fluids.  Objective:  Vitals:   03/16/23 0748 03/16/23 1525  BP:  116/75  Pulse:  71  Resp:  17  Temp:  98.3 F (36.8 C)  SpO2: 97% 96%     Intake/Output Summary (Last 24 hours) at 03/16/2023 1908 Last data filed at 03/15/2023 2100 Gross per 24 hour  Intake --  Output 900 ml  Net -900 ml  Bilateral BKA.  Physical Exam Vitals and nursing note reviewed.  HENT:     Head: Normocephalic and atraumatic.     Mouth/Throat:     Pharynx: Oropharynx is clear.  Eyes:     Extraocular Movements: Extraocular movements intact.     Pupils: Pupils are equal, round, and reactive to light.  Cardiovascular:     Rate and Rhythm: Normal rate and regular rhythm.  Pulmonary:     Comments: Decreased breath sounds bilaterally.  Abdominal:     Palpations: Abdomen is soft.  Musculoskeletal:        General: Normal range of motion.     Cervical back: Normal range of motion.  Skin:    General: Skin is warm.  Neurological:     General: No focal deficit present.     Mental Status: He is alert and oriented to person, place, and time.  Psychiatric:        Behavior: Behavior normal.        Judgment: Judgment normal.      Labs:  Lab Results  Component Value Date   WBC 6.7 03/15/2023   HGB 11.4 (L) 03/15/2023   HCT 36.1 (L) 03/15/2023   MCV 93.8 03/15/2023   PLT 160 03/15/2023   NEUTROABS 4.7 03/14/2023    Lab Results  Component Value Date   NA 138 03/15/2023   K 3.7 03/15/2023   CL 100 03/15/2023   CO2 30 03/15/2023    Studies:  CT CHEST ABDOMEN PELVIS W CONTRAST  Result Date: 03/16/2023 CLINICAL DATA:  67 year old male with history of esophageal cancer. * Tracking Code: BO * EXAM: CT CHEST, ABDOMEN, AND PELVIS WITH CONTRAST TECHNIQUE: Multidetector CT imaging of the chest, abdomen and pelvis was performed following  the standard protocol during bolus administration of intravenous contrast. RADIATION DOSE REDUCTION: This exam was performed according to the departmental dose-optimization program which includes automated exposure control, adjustment of the mA and/or kV according to patient size and/or use of iterative reconstruction technique. CONTRAST:  OMNIPAQUE IOHEXOL 300 MG/ML  SOLN COMPARISON:  CT of the abdomen and pelvis 08/13/2022. Chest CTA 09/10/2013. FINDINGS: CT CHEST FINDINGS Cardiovascular: Heart size is normal. There is no significant pericardial fluid, thickening or pericardial calcification. Atherosclerotic calcifications are noted in the thoracic aorta as well as the left anterior descending, left circumflex and right coronary arteries. Status post median sternotomy for CABG including LIMA to the LAD. Mediastinum/Nodes: No pathologically enlarged mediastinal or hilar lymph nodes. Extensive thickening of the distal esophagus measuring up to 1.7 cm in thickness shortly before the gastroesophageal junction where there is also haziness in the paraesophageal fat of the middle mediastinum, concerning for potential esophageal neoplasm. No axillary lymphadenopathy. Lungs/Pleura: No acute consolidative airspace disease. No pleural effusions. No definite suspicious appearing pulmonary nodules or masses are noted. Scattered areas of mild scarring are noted in the lung bases bilaterally. Musculoskeletal: Median sternotomy wires. There are  no aggressive appearing lytic or blastic lesions noted in the visualized portions of the skeleton. CT ABDOMEN PELVIS FINDINGS Hepatobiliary: No suspicious cystic or solid hepatic lesions. No intra or extrahepatic biliary ductal dilatation. Gallbladder is unremarkable in appearance. Pancreas: No pancreatic mass. No pancreatic ductal dilatation. No pancreatic or peripancreatic fluid collections or inflammatory changes. Spleen: Unremarkable. Adrenals/Urinary Tract: Exophytic 1.7 cm  low-attenuation lesion in the posterior aspect of the interpolar region of the left kidney, compatible with a simple cyst. Centrally low-attenuation peripherally thick walled right-sided perinephric fluid collection (axial image 84 of series 2) measuring 5.0 x 3.5 cm, tracking caudally along the right retroperitoneum anterior to the iliopsoas musculature, likely a chronic and unresolved hematoma, although abscess is not entirely excluded. The inferior aspect of this appears larger than prior studies (axial image 91 of series 2) measuring up to 6.6 x 2.9 cm just below the lower pole of the right kidney. No hydroureteronephrosis. Urinary bladder is unremarkable in appearance. Mild nodular thickening of the adrenal glands bilaterally, stable compared to the prior study and nonspecific. Stomach/Bowel: Stomach is nearly completely decompressed, but otherwise unremarkable in appearance. No pathologic dilatation of small bowel or colon. Numerous colonic diverticula are noted, without surrounding inflammatory changes to suggest an acute diverticulitis at this time. Normal appendix. Vascular/Lymphatic: Atherosclerosis in the abdominal aorta and pelvic vasculature, without evidence of aneurysm or dissection in the abdominal or pelvic vasculature. Multiple prominent borderline enlarged retroperitoneal lymph nodes, nonspecific. No definite pathologically enlarged lymph nodes in the abdomen or pelvis. Reproductive: Prostate gland and seminal vesicles are unremarkable in appearance. Other: No significant volume of ascites.  No pneumoperitoneum. Musculoskeletal: There are no aggressive appearing lytic or blastic lesions noted in the visualized portions of the skeleton. IMPRESSION: 1. Circumferential mass-like thickening of the distal esophagus corresponding to reported esophageal neoplasm. Haziness in the paraesophageal fat could reflect early local invasion. No definitive evidence of metastatic disease confidently identified. 2.  Chronic perinephric fluid collection adjacent to the right kidney appears larger than the prior examination, now extending caudally in the retroperitoneum. Whether this represents a chronic unresolved hematoma or abscess is uncertain. No internal gas to clearly indicate infection at this time. Further clinical evaluation is recommended. 3. Aortic atherosclerosis, in addition to three-vessel coronary artery disease. Status post median sternotomy for CABG including LIMA to the LAD. 4. Colonic diverticulosis without evidence of acute diverticulitis at this time. 5. Additional incidental findings, as above. Electronically Signed   By: Trudie Reed M.D.   On: 03/16/2023 08:00    67 y.o. male patient with a history of smoking  CAD' A.fib  DM/peripheral vascular disease status post bilateral amputations currently admitted to hospital for dysphagia noted to have Esophagus mass.    # Esophagus  mass-imaging findings highly concerning for malignancy-status post GI evaluation/EGD.  Pathology pending.-CT scan chest and pelvis-lower esophagus mass noted-possible involvement of the paraesophageal fat/concerning for early invasion of the mediastinum.  Otherwise no pathologic lymph node noted or evidence of distant metastatic disease.  Clinically not resectable.  Patient would benefit from chemoradiation.    # mild Anemia: Likely due to GI bleed/secondary to #1   # ECOG performance status:2.  bil BKA- [sec to DM/ PVD; June 2023]- in wheel chair   # CAD/A.fib -currently on Lovenox [Dr.Arida]-given upcoming plan for feeding tube placement   # DM- [O conell]Hb A1c- 7.2.    # Weight loss/malnutrition; agree with feeding tube placement.  Discussed with Dr. Maia Plan.   # IV access: Will discuss with  patient regarding Mediport placement.    # Plan of care was discussed with Dr. Allena Katz; Dr. Maia Plan.  [name], MD 03/16/2023  7:08 PM

## 2023-03-16 NOTE — Consult Note (Signed)
SURGICAL CONSULTATION NOTE   HISTORY OF PRESENT ILLNESS (HPI):  67 y.o. male presented to [facility] ED for evaluation of dysphagia. Patient reports he has been having difficulty swallowing for the last couple of weeks.  Dysphagia is with solids and liquids.  Denies abdominal pain.  No pain radiation.  No chest pain.  No alleviating factors.  Aggravating factors eating solid food.  Patient was evaluated with barium swallow and was found with stricture of the distal esophagus concerning for esophageal mass.  Patient admitted to hospital for further evaluation.  Patient had a upper endoscopy and was found with malignant appearing esophageal stenosis.  Biopsy was taken.  Unable to pass scope distal to the stenosis.  Biopsy results still pending.  Patient was evaluated by medical oncology.  They are currently doing workup of the mass.  They recommended feeding tube placement for nutrition.  Surgery is consulted by Dr. Allena Katz in this context for evaluation and management of feeding tube placement.  PAST MEDICAL HISTORY (PMH):  Past Medical History:  Diagnosis Date   Arrhythmia    atrial fibrillation   Asthma    CHF (congestive heart failure) (HCC)    COPD (chronic obstructive pulmonary disease) (HCC)    Coronary artery disease    Depression    Diabetes mellitus without complication (HCC)    Gout    History anabolic steroid use    Hyperlipidemia    Hypertension    Hypogonadism in male    MI (myocardial infarction) (HCC)    Morbid obesity (HCC)    Myocardial infarction (HCC)    Peripheral vascular disease (HCC)    Perirectal abscess    Pleurisy    Sleep apnea    CPAP at night, no oxygen   Varicella      PAST SURGICAL HISTORY (PSH):  Past Surgical History:  Procedure Laterality Date   ABDOMINAL AORTIC ANEURYSM REPAIR     ACHILLES TENDON SURGERY Left 01/10/2021   Procedure: ACHILLES LENGTHENING/KIDNER;  Surgeon: Rosetta Posner, DPM;  Location: [facility] OR[facility];  Service: Podiatry;  Laterality:  Left;   AMPUTATION Left 10/03/2021   Procedure: AMPUTATION BELOW KNEE;  Surgeon: Renford Dills, MD;  Location: [facility];  Service: Vascular;  Laterality: Left;   AMPUTATION Right 05/24/2022   Procedure: AMPUTATION BELOW KNEE;  Surgeon: Annice Needy, MD;  Location: [facility];  Service: General;  Laterality: Right;   AMPUTATION TOE Right 02/10/2016   Procedure: AMPUTATION TOE 3RD TOE;  Surgeon: Gwyneth Revels, DPM;  Location: [facility];  Service: Podiatry;  Laterality: Right;   AMPUTATION TOE Left 02/24/2020   Procedure: AMPUTATION TOE;  Surgeon: Rosetta Posner, DPM;  Location: [facility];  Service: Podiatry;  Laterality: Left;   APPLICATION OF WOUND VAC Left 02/29/2020   Procedure: APPLICATION OF WOUND VAC;  Surgeon: Rosetta Posner, DPM;  Location: [facility];  Service: Podiatry;  Laterality: Left;   CARDIAC CATHETERIZATION     CARDIOVERSION [name] 02/13/2022   Procedure: CARDIOVERSION;  Surgeon: Lamar Blinks, MD;  Location: [facility];  Service: Cardiovascular;  Laterality: [name];   COLONOSCOPY WITH PROPOFOL [name] 11/18/2015   Procedure: COLONOSCOPY WITH PROPOFOL;  Surgeon: Scot Jun, MD;  Location: [facility];  Service: Endoscopy;  Laterality: [name];   CORONARY ARTERY BYPASS GRAFT     CORONARY STENT INTERVENTION [name] 02/02/2020   Procedure: CORONARY STENT INTERVENTION;  Surgeon: Marcina Millard, MD;  Location: [facility];  Service: Cardiovascular;  Laterality: [name];   INCISION AND DRAINAGE Left 08/07/2021   Procedure:  INCISION AND DRAINAGE-Partial Calcanectomy;  Surgeon: Rosetta Posner, DPM;  Location: [facility];  Service: Podiatry;  Laterality: Left;   IRRIGATION AND DEBRIDEMENT FOOT Left 02/29/2020   Procedure: IRRIGATION AND DEBRIDEMENT FOOT;  Surgeon: Rosetta Posner, DPM;  Location: [facility];  Service: Podiatry;  Laterality: Left;   IRRIGATION AND DEBRIDEMENT FOOT Left 02/24/2020   Procedure: IRRIGATION AND DEBRIDEMENT FOOT;  Surgeon: Rosetta Posner, DPM;  Location: [facility];   Service: Podiatry;  Laterality: Left;   KNEE ARTHROSCOPY     LEFT HEART CATH AND CORS/GRAFTS ANGIOGRAPHY [name] 02/02/2020   Procedure: LEFT HEART CATH AND CORS/GRAFTS ANGIOGRAPHY;  Surgeon: Dalia Heading, MD;  Location: [facility];  Service: Cardiovascular;  Laterality: [name];   LOWER EXTREMITY ANGIOGRAPHY Left 02/25/2020   Procedure: Lower Extremity Angiography;  Surgeon: Annice Needy, MD;  Location: [facility];  Service: Cardiovascular;  Laterality: Left;   LOWER EXTREMITY ANGIOGRAPHY Left 01/04/2021   Procedure: LOWER EXTREMITY ANGIOGRAPHY;  Surgeon: Annice Needy, MD;  Location: [facility];  Service: Cardiovascular;  Laterality: Left;   LOWER EXTREMITY ANGIOGRAPHY Right 05/21/2022   Procedure: Lower Extremity Angiography;  Surgeon: Annice Needy, MD;  Location: [facility];  Service: Cardiovascular;  Laterality: Right;   METATARSAL HEAD EXCISION Left 01/10/2021   Procedure: METATARSAL HEAD EXCISION - LEFT 5th;  Surgeon: Rosetta Posner, DPM;  Location: [facility];  Service: Podiatry;  Laterality: Left;   PERIPHERAL VASCULAR CATHETERIZATION Right 01/24/2016   Procedure: Lower Extremity Angiography;  Surgeon: Renford Dills, MD;  Location: [facility];  Service: Cardiovascular;  Laterality: Right;   PERIPHERAL VASCULAR CATHETERIZATION Right 01/25/2016   Procedure: Lower Extremity Angiography;  Surgeon: Renford Dills, MD;  Location: [facility];  Service: Cardiovascular;  Laterality: Right;   TOE AMPUTATION     TONSILLECTOMY       MEDICATIONS:  Prior to Admission medications   Medication Sig Start Date End Date Taking? Authorizing Provider  acetaminophen (TYLENOL) 325 MG tablet Take 2 tablets (650 mg total) by mouth every 6 (six) hours as needed for mild pain (or Fever >/= 101). 05/29/22  Yes Sunnie Nielsen, DO  amiodarone (PACERONE) 200 MG tablet Take 1 tablet (200 mg total) by mouth daily. 06/20/22  Yes [name], MD   apixaban (ELIQUIS) 5 MG TABS tablet Take 5 mg by mouth 2 (two) times daily.   Yes [provider]  aspirin EC 81 MG tablet Take 81 mg by mouth daily. Swallow whole.   Yes [provider]  budesonide (PULMICORT) 0.5 MG/2ML nebulizer solution Take 2 mLs (0.5 mg total) by nebulization 2 (two) times daily. 11/10/21  Yes Sunnie Nielsen, DO  Cholecalciferol 25 MCG (1000 UT) tablet Take 1,000 Units by mouth daily.   Yes [provider]  clonazePAM (KLONOPIN) 0.5 MG tablet Take 0.5 mg by mouth 2 (two) times daily.   Yes [provider]  Continuous Glucose Sensor (FREESTYLE LIBRE 3 SENSOR) MISC Use 1 sensor every 14 (fourteen) days. 01/10/23  Yes   Cyanocobalamin (VITAMIN B-12) 5000 MCG TBDP Take 10,000 mcg by mouth daily.   Yes [provider]  diltiazem (CARDIZEM CD) 300 MG 24 hr capsule Take 1 capsule (300 mg total) by mouth daily. 03/04/23  Yes [name], MD  ezetimibe (ZETIA) 10 MG tablet Take 10 mg by mouth at bedtime.   Yes [provider]  ferrous sulfate 325 (65 FE) MG tablet Take 325 mg by mouth  daily with breakfast.   Yes [provider]  finasteride (PROSCAR) 5 MG tablet Take 1 tablet (5 mg total) by mouth daily. 11/11/21  Yes Sunnie Nielsen, DO  furosemide (LASIX) 20 MG tablet Take 1 tablet (20 mg total) by mouth ONCE OR TWICE daily (total daily dose maximum 40 mg) as needed for increased swelling, shortness of breath, weight gain 5+ lbs over 1-2 days. Seek medical care if these symptoms are not improving with increased dose, or if you become dizzy or have low blood pressure. 05/29/22  Yes Sunnie Nielsen, DO  gabapentin (NEURONTIN) 300 MG capsule Take 300 mg by mouth at bedtime.   Yes [provider]  insulin glargine (LANTUS) 100 UNIT/ML injection Inject 15 units into the skin daily in AM and inject 25 units into the skin daily in PM 05/29/22  Yes Sunnie Nielsen, DO  isosorbide mononitrate (IMDUR) 60 MG 24  hr tablet Take 1 tablet (60 mg total) by mouth in the morning and at bedtime. 06/20/22  Yes [name], MD  levothyroxine (SYNTHROID) 25 MCG tablet Take 1 tablet (25 mcg total) by mouth daily at 6 (six) AM. 11/11/21  Yes Sunnie Nielsen, DO  losartan (COZAAR) 25 MG tablet Take 1 tablet (25 mg total) by mouth daily. 05/30/22  Yes Sunnie Nielsen, DO  Multiple Vitamins-Minerals (MULTIVITAMIN WITH MINERALS) tablet Take 1 tablet by mouth daily.   Yes [provider]  nitrofurantoin, macrocrystal-monohydrate, (MACROBID) 100 MG capsule Take 100 mg by mouth daily.   Yes [provider]  nitroGLYCERIN (NITROSTAT) 0.4 MG SL tablet Place 1 tablet (0.4 mg total) under the tongue every 5 (five) minutes as needed for chest pain. 06/20/22  Yes [name], MD  nystatin (MYCOSTATIN/NYSTOP) powder Apply 1 Application topically 2 (two) times daily.   Yes [provider]  ondansetron (ZOFRAN) 4 MG tablet Take 1 tablet (4 mg total) by mouth every 6 (six) hours as needed for nausea. 05/29/22  Yes Sunnie Nielsen, DO  pramipexole (MIRAPEX) 1 MG tablet Take 2 mg by mouth at bedtime.   Yes [provider]  primidone (MYSOLINE) 250 MG tablet Take 250 mg by mouth 2 (two) times daily. 01/23/20  Yes [provider]  tamsulosin (FLOMAX) 0.4 MG CAPS capsule Take 0.4 mg by mouth daily.   Yes [provider]  vitamin C (ASCORBIC ACID) 500 MG tablet Take 500 mg by mouth daily.   Yes [provider]  zinc sulfate 220 (50 Zn) MG capsule Take 1 capsule (220 mg total) by mouth daily. 05/29/22  Yes Sunnie Nielsen, DO  Continuous Blood Gluc Sensor (FREESTYLE LIBRE 3 SENSOR) MISC  06/13/22   [provider]  HUMALOG 100 UNIT/ML injection Inject 15 Units into the skin 3 (three) times daily with meals. >200 02/05/22   [provider]  Infant Care Products [facility]) OINT Apply 1 Application topically daily as needed.    [provider]  mupirocin ointment (BACTROBAN) 2 % Apply 1 Application topically daily. 12/13/22   Georgiana Spinner, NP  nutrition supplement, JUVEN, (JUVEN) PACK Take 1 packet by mouth 2 (two) times daily between meals. 05/29/22   Sunnie Nielsen, DO  risperiDONE (RISPERDAL) 0.5 MG tablet Take 0.5 tablets (0.25 mg total) by mouth 2 (two) times daily. Patient not taking: Reported on 03/14/2023 05/29/22   Sunnie Nielsen, DO  sulfamethoxazole-trimethoprim (BACTRIM DS) 800-160 MG tablet Take 1 tablet by mouth 2 (two) times daily. Patient not taking: Reported on 03/14/2023 12/13/22  Georgiana Spinner, NP     ALLERGIES:  Allergies  Allergen Reactions   Penicillins Other (See Comments)    Happened at 67 years old and pt. stated he passed out  He has tolerated amoxicillin/clavulanate and ampicillin/sulbactam   Statins     Other reaction(s): Muscle Pain Causes legs to ache per pt   Metformin And Related Diarrhea     SOCIAL HISTORY:  Social History   Socioeconomic History   Marital status: Married    Spouse name: Not on file   Number of children: Not on file   Years of education: Not on file   Highest education level: Not on file  Occupational History   Not on file  Tobacco Use   Smoking status: Former    Current packs/day: 0.00    Average packs/day: 0.5 packs/day for 45.0 years (22.5 ttl pk-yrs)    Types: Cigarettes    Start date: 04/04/1970    Quit date: 04/05/2015    Years since quitting: 7.9   Smokeless tobacco: Never  Vaping Use   Vaping status: Every Day  Substance and Sexual Activity   Alcohol use: Not Currently    Alcohol/week: 3.0 standard drinks of alcohol    Types: 3 Glasses of wine per week   Drug use: No   Sexual activity: Not on file  Other Topics Concern   Not on file  Social History Narrative   Not on file   Social Determinants of Health   Financial Resource Strain: Low Risk  (09/28/2022)   Received from [facility], [facility]    Overall Financial Resource Strain (CARDIA)    Difficulty of Paying Living Expenses: Not hard at all  Food Insecurity: No Food Insecurity (03/15/2023)   Hunger Vital Sign    Worried About Running Out of Food in the Last Year: Never true    Ran Out of Food in the Last Year: Never true  Transportation Needs: No Transportation Needs (03/15/2023)   PRAPARE - Administrator, Civil Service (Medical): No    Lack of Transportation (Non-Medical): No  Physical Activity: Not on file  Stress: Not on file  Social Connections: Not on file  Intimate Partner Violence: Not At Risk (03/15/2023)   Humiliation, Afraid, Rape, and Kick questionnaire    Fear of Current or Ex-Partner: No    Emotionally Abused: No    Physically Abused: No    Sexually Abused: No      FAMILY HISTORY:  Family History  Problem Relation Age of Onset   Hypertension Father    Coronary artery disease Father    Alcohol abuse Father    Heart failure Brother      REVIEW OF SYSTEMS:  Constitutional: denies weight loss, fever, chills, or sweats  Eyes: denies any other vision changes, history of eye injury  ENT: denies sore throat, hearing problems  Respiratory: denies shortness of breath, wheezing  Cardiovascular: denies chest pain, palpitations  Gastrointestinal: Denies abdominal pain, nausea and vomiting Genitourinary: denies burning with urination or urinary frequency Musculoskeletal: denies any other joint pains or cramps  Skin: denies any other rashes or skin discolorations  Neurological: denies any other headache, dizziness, weakness  Psychiatric: denies any other depression, anxiety   All other review of systems were negative   VITAL SIGNS:  Temp:  [96.9 F (36.1 C)-97.8 F (36.6 C)] 97.7 F (36.5 C) (11/16 0735) Pulse Rate:  [63-73] 72 (11/16 0735) Resp:  [14-17] 17 (11/16 0735) BP: (  107-133)/(63-79) 130/71 (11/16 0735) SpO2:  [84 %-99 %] 97 % (11/16 0748)     Height: 6\' 4"  (193 cm) Weight: 135 kg  BMI (Calculated): 36.24   INTAKE/OUTPUT:  This shift: No intake/output data recorded.  Last 2 shifts: @IOLAST2SHIFTS @   PHYSICAL EXAM:  Constitutional:  --Obese -- Awake, alert, and oriented x3  Eyes:  -- Pupils equally round and reactive to light  -- No scleral icterus  Ear, nose, and throat:  -- No jugular venous distension  Pulmonary:  -- No crackles  -- Equal breath sounds bilaterally -- Breathing non-labored at rest Cardiovascular:  -- S1, S2 present  -- No pericardial rubs Gastrointestinal:  -- Abdomen soft, nontender, non-distended, no guarding or rebound tenderness -- No abdominal masses appreciated, pulsatile or otherwise  Musculoskeletal and Integumentary:  -- Wounds: None appreciated -- Extremities: Bilateral BKA Neurologic:  -- Motor function: intact and symmetric -- Sensation: intact and symmetric   Labs:     Latest Ref Rng & Units 03/15/2023    4:02 AM 03/14/2023    4:18 PM 06/06/2022    2:59 AM  CBC  WBC 4.0 - 10.5 K/uL 6.7  7.3  9.8   Hemoglobin 13.0 - 17.0 g/dL 04.5  40.9  8.5   Hematocrit 39.0 - 52.0 % 36.1  42.4  27.6   Platelets 150 - 400 K/uL 160  170  310       Latest Ref Rng & Units 03/15/2023    4:02 AM 03/14/2023    4:18 PM 08/13/2022    2:12 PM  CMP  Glucose 70 - 99 mg/dL 811  914    BUN 8 - 23 mg/dL 27  25    Creatinine 7.82 - 1.24 mg/dL 9.56  2.13  0.86   Sodium 135 - 145 mmol/L 138  137    Potassium 3.5 - 5.1 mmol/L 3.7  4.4    Chloride 98 - 111 mmol/L 100  98    CO2 22 - 32 mmol/L 30  30    Calcium 8.9 - 10.3 mg/dL 8.5  8.9    Total Protein 6.5 - 8.1 g/dL  7.3    Total Bilirubin <1.2 mg/dL  0.4    Alkaline Phos 38 - 126 U/L  122    AST 15 - 41 U/L  15    ALT 0 - 44 U/L  19      Imaging studies:  EXAM: CT CHEST, ABDOMEN, AND PELVIS WITH CONTRAST   TECHNIQUE: Multidetector CT imaging of the chest, abdomen and pelvis was performed following the standard protocol during bolus administration of intravenous contrast.    RADIATION DOSE REDUCTION: This exam was performed according to the departmental dose-optimization program which includes automated exposure control, adjustment of the mA and/or kV according to patient size and/or use of iterative reconstruction technique.   CONTRAST:  OMNIPAQUE IOHEXOL 300 MG/ML  SOLN   COMPARISON:  CT of the abdomen and pelvis 08/13/2022. Chest CTA 09/10/2013.   FINDINGS: CT CHEST FINDINGS   Cardiovascular: Heart size is normal. There is no significant pericardial fluid, thickening or pericardial calcification. Atherosclerotic calcifications are noted in the thoracic aorta as well as the left anterior descending, left circumflex and right coronary arteries. Status post median sternotomy for CABG including LIMA to the LAD.   Mediastinum/Nodes: No pathologically enlarged mediastinal or hilar lymph nodes. Extensive thickening of the distal esophagus measuring up to 1.7 cm in thickness shortly before the gastroesophageal junction where there is also haziness in the  paraesophageal fat of the middle mediastinum, concerning for potential esophageal neoplasm. No axillary lymphadenopathy.   Lungs/Pleura: No acute consolidative airspace disease. No pleural effusions. No definite suspicious appearing pulmonary nodules or masses are noted. Scattered areas of mild scarring are noted in the lung bases bilaterally.   Musculoskeletal: Median sternotomy wires. There are no aggressive appearing lytic or blastic lesions noted in the visualized portions of the skeleton.   CT ABDOMEN PELVIS FINDINGS   Hepatobiliary: No suspicious cystic or solid hepatic lesions. No intra or extrahepatic biliary ductal dilatation. Gallbladder is unremarkable in appearance.   Pancreas: No pancreatic mass. No pancreatic ductal dilatation. No pancreatic or peripancreatic fluid collections or inflammatory changes.   Spleen: Unremarkable.   Adrenals/Urinary Tract: Exophytic 1.7 cm  low-attenuation lesion in the posterior aspect of the interpolar region of the left kidney, compatible with a simple cyst. Centrally low-attenuation peripherally thick walled right-sided perinephric fluid collection (axial image 84 of series 2) measuring 5.0 x 3.5 cm, tracking caudally along the right retroperitoneum anterior to the iliopsoas musculature, likely a chronic and unresolved hematoma, although abscess is not entirely excluded. The inferior aspect of this appears larger than prior studies (axial image 91 of series 2) measuring up to 6.6 x 2.9 cm just below the lower pole of the right kidney. No hydroureteronephrosis. Urinary bladder is unremarkable in appearance. Mild nodular thickening of the adrenal glands bilaterally, stable compared to the prior study and nonspecific.   Stomach/Bowel: Stomach is nearly completely decompressed, but otherwise unremarkable in appearance. No pathologic dilatation of small bowel or colon. Numerous colonic diverticula are noted, without surrounding inflammatory changes to suggest an acute diverticulitis at this time. Normal appendix.   Vascular/Lymphatic: Atherosclerosis in the abdominal aorta and pelvic vasculature, without evidence of aneurysm or dissection in the abdominal or pelvic vasculature. Multiple prominent borderline enlarged retroperitoneal lymph nodes, nonspecific. No definite pathologically enlarged lymph nodes in the abdomen or pelvis.   Reproductive: Prostate gland and seminal vesicles are unremarkable in appearance.   Other: No significant volume of ascites.  No pneumoperitoneum.   Musculoskeletal: There are no aggressive appearing lytic or blastic lesions noted in the visualized portions of the skeleton.   IMPRESSION: 1. Circumferential mass-like thickening of the distal esophagus corresponding to reported esophageal neoplasm. Haziness in the paraesophageal fat could reflect early local invasion. No definitive evidence  of metastatic disease confidently identified. 2. Chronic perinephric fluid collection adjacent to the right kidney appears larger than the prior examination, now extending caudally in the retroperitoneum. Whether this represents a chronic unresolved hematoma or abscess is uncertain. No internal gas to clearly indicate infection at this time. Further clinical evaluation is recommended. 3. Aortic atherosclerosis, in addition to three-vessel coronary artery disease. Status post median sternotomy for CABG including LIMA to the LAD. 4. Colonic diverticulosis without evidence of acute diverticulitis at this time. 5. Additional incidental findings, as above.     Electronically Signed   By: Trudie Reed M.D.   On: 03/16/2023 08:00  Assessment/Plan:  67 y.o. male with esophageal mass, complicated by pertinent comorbidities including coronary artery disease, CHF, COPD, asthma, severe peripheral vascular disease, type 2 diabetes, obstructive sleep apnea on CPAP.  -Esophageal mass concerning for distal esophageal malignancy.  Biopsy taken currently in process -CT scan of the chest abdomen and pelvis on that shows any significant evidence of distant metastatic disease -Currently being on staging workup by medical oncology -I was requested to evaluate patient for feeding tube placement. -Once complete workup is done I will  be able to discuss with patient feeding tube alternatives -Patient will benefit of cardiology evaluation due to significant cardiac disease   Gae Gallop, MD

## 2023-03-16 NOTE — Plan of Care (Signed)
  Problem: Education: Goal: Knowledge of General Education information will improve Description: Including pain rating scale, medication(s)/side effects and non-pharmacologic comfort measures Outcome: Progressing   Problem: Health Behavior/Discharge Planning: Goal: Ability to manage health-related needs will improve Outcome: Progressing   Problem: Clinical Measurements: Goal: Ability to maintain clinical measurements within normal limits will improve Outcome: Progressing Goal: Will remain free from infection Outcome: Progressing Goal: Diagnostic test results will improve Outcome: Progressing Goal: Respiratory complications will improve Outcome: Progressing Goal: Cardiovascular complication will be avoided Outcome: Progressing   Problem: Coping: Goal: Level of anxiety will decrease Outcome: Progressing   Problem: Education: Goal: Ability to describe self-care measures that may prevent or decrease complications (Diabetes Survival Skills Education) will improve Outcome: Progressing Goal: Individualized Educational Video(s) Outcome: Progressing

## 2023-03-16 NOTE — Progress Notes (Signed)
       CROSS COVER NOTE  NAME: [name]. MRN: [id number] DOB : [birth year]    Concern as stated by nurse / staff   Message received from RN Hello, pt is on a strict npo. BG has dropped to 83. Will hold night time insulin. Would you like to put him on fluids since no ongoing intervention until Monday?     Pertinent findings on chart review: Strict NPO due to obstructing esophageal mass  Assessment and  Interventions   Assessment:  Plan: Discontinued long acting insulin Continue monitoring blood glucose every 4h     Donnie Mesa NP [facility] Cross Cover 7pm-7am - check amion for availability Pager [phone]

## 2023-03-16 NOTE — Progress Notes (Signed)
[facility]  - [place] at [facility]   PATIENT NAME: [name]    MR#:  [id number]  DATE OF BIRTH:  Jan 29, 1956  SUBJECTIVE:  no family at bedside. Patient is wondering if he can take some ice chips and water told him he had a significant blockage and not want him to aspirator throw up. He can use swabs to wet his mouth. Denies any chest pain or shortness of breath    VITALS:  Blood pressure 130/71, pulse 72, temperature 97.7 F (36.5 C), resp. rate 17, height 6\' 4"  (1.93 m), weight 135 kg, SpO2 97%.  PHYSICAL EXAMINATION:   GENERAL:  67 y.o.-year-old patient with no acute distress. Obese LUNGS: Normal breath sounds bilaterally, no wheezing CARDIOVASCULAR: S1, S2 normal. No murmur   ABDOMEN: Soft, nontender, nondistended. Bowel sounds present.  EXTREMITIES: are well healed amputation stump  NEUROLOGIC: nonfocal  patient is alert and awake  LABORATORY PANEL:  CBC Recent Labs  Lab 03/15/23 0402  WBC 6.7  HGB 11.4*  HCT 36.1*  PLT 160    Chemistries  Recent Labs  Lab 03/14/23 1618 03/15/23 0402  NA 137 138  K 4.4 3.7  CL 98 100  CO2 30 30  GLUCOSE 193* 188*  BUN 25* 27*  CREATININE 1.27* 1.14  CALCIUM 8.9 8.5*  AST 15  --   ALT 19  --   ALKPHOS 122  --   BILITOT 0.4  --    Cardiac Enzymes No results for input(s): "TROPONINI" in the last 168 hours. RADIOLOGY:  CT CHEST ABDOMEN PELVIS W CONTRAST  Result Date: 03/16/2023 CLINICAL DATA:  67 year old male with history of esophageal cancer. * Tracking Code: BO * EXAM: CT CHEST, ABDOMEN, AND PELVIS WITH CONTRAST TECHNIQUE: Multidetector CT imaging of the chest, abdomen and pelvis was performed following the standard protocol during bolus administration of intravenous contrast. RADIATION DOSE REDUCTION: This exam was performed according to the departmental dose-optimization program which includes automated exposure control, adjustment of the mA and/or kV according to patient size and/or use of  iterative reconstruction technique. CONTRAST:  OMNIPAQUE IOHEXOL 300 MG/ML  SOLN COMPARISON:  CT of the abdomen and pelvis 08/13/2022. Chest CTA 09/10/2013. FINDINGS: CT CHEST FINDINGS Cardiovascular: Heart size is normal. There is no significant pericardial fluid, thickening or pericardial calcification. Atherosclerotic calcifications are noted in the thoracic aorta as well as the left anterior descending, left circumflex and right coronary arteries. Status post median sternotomy for CABG including LIMA to the LAD. Mediastinum/Nodes: No pathologically enlarged mediastinal or hilar lymph nodes. Extensive thickening of the distal esophagus measuring up to 1.7 cm in thickness shortly before the gastroesophageal junction where there is also haziness in the paraesophageal fat of the middle mediastinum, concerning for potential esophageal neoplasm. No axillary lymphadenopathy. Lungs/Pleura: No acute consolidative airspace disease. No pleural effusions. No definite suspicious appearing pulmonary nodules or masses are noted. Scattered areas of mild scarring are noted in the lung bases bilaterally. Musculoskeletal: Median sternotomy wires. There are no aggressive appearing lytic or blastic lesions noted in the visualized portions of the skeleton. CT ABDOMEN PELVIS FINDINGS Hepatobiliary: No suspicious cystic or solid hepatic lesions. No intra or extrahepatic biliary ductal dilatation. Gallbladder is unremarkable in appearance. Pancreas: No pancreatic mass. No pancreatic ductal dilatation. No pancreatic or peripancreatic fluid collections or inflammatory changes. Spleen: Unremarkable. Adrenals/Urinary Tract: Exophytic 1.7 cm low-attenuation lesion in the posterior aspect of the interpolar region of the left kidney, compatible with a simple cyst. Centrally low-attenuation peripherally thick walled  right-sided perinephric fluid collection (axial image 84 of series 2) measuring 5.0 x 3.5 cm, tracking caudally along the  right retroperitoneum anterior to the iliopsoas musculature, likely a chronic and unresolved hematoma, although abscess is not entirely excluded. The inferior aspect of this appears larger than prior studies (axial image 91 of series 2) measuring up to 6.6 x 2.9 cm just below the lower pole of the right kidney. No hydroureteronephrosis. Urinary bladder is unremarkable in appearance. Mild nodular thickening of the adrenal glands bilaterally, stable compared to the prior study and nonspecific. Stomach/Bowel: Stomach is nearly completely decompressed, but otherwise unremarkable in appearance. No pathologic dilatation of small bowel or colon. Numerous colonic diverticula are noted, without surrounding inflammatory changes to suggest an acute diverticulitis at this time. Normal appendix. Vascular/Lymphatic: Atherosclerosis in the abdominal aorta and pelvic vasculature, without evidence of aneurysm or dissection in the abdominal or pelvic vasculature. Multiple prominent borderline enlarged retroperitoneal lymph nodes, nonspecific. No definite pathologically enlarged lymph nodes in the abdomen or pelvis. Reproductive: Prostate gland and seminal vesicles are unremarkable in appearance. Other: No significant volume of ascites.  No pneumoperitoneum. Musculoskeletal: There are no aggressive appearing lytic or blastic lesions noted in the visualized portions of the skeleton. IMPRESSION: 1. Circumferential mass-like thickening of the distal esophagus corresponding to reported esophageal neoplasm. Haziness in the paraesophageal fat could reflect early local invasion. No definitive evidence of metastatic disease confidently identified. 2. Chronic perinephric fluid collection adjacent to the right kidney appears larger than the prior examination, now extending caudally in the retroperitoneum. Whether this represents a chronic unresolved hematoma or abscess is uncertain. No internal gas to clearly indicate infection at this time.  Further clinical evaluation is recommended. 3. Aortic atherosclerosis, in addition to three-vessel coronary artery disease. Status post median sternotomy for CABG including LIMA to the LAD. 4. Colonic diverticulosis without evidence of acute diverticulitis at this time. 5. Additional incidental findings, as above. Electronically Signed   By: Trudie Reed M.D.   On: 03/16/2023 08:00   DG ESOPHAGUS W DOUBLE CM (HD)  Result Date: 03/14/2023 CLINICAL DATA:  Provided history: Essential hypertension. Dysphagia, unspecified type. Additional history: the patient reports dysphagia and vomiting for two weeks with 20 pound weight loss in this time. EXAM: ESOPHAGUS/BARIUM SWALLOW TECHNIQUE: A single contrast examination was performed using thin liquid barium. The exam was performed by Pattricia Boss, PA-C and was supervised and interpreted by Dr. Jackey Loge. FLUOROSCOPY: Radiation Exposure Index (as provided by the fluoroscopic device): 37.70 mGy Kerma COMPARISON:  CT abdomen/pelvis 08/13/2022.  Chest CT 09/10/2013. FINDINGS: Extensive filling defects throughout the mid and distal thoracic esophagus, likely reflecting retained food and pills and markedly limiting mucosal evaluation. Markedly delayed esophageal contrast transit, with only a small amount of contrast passing into the stomach during the examination. Known small hiatal hernia (better appreciated on the prior CT abdomen/pelvis of 08/13/2022). Findings discussed with Dr. Aram Beecham by telephone at 2:40 p.m. on 03/14/2023. The patient was transported directly from the fluoroscopy suite to the emergency department. IMPRESSION: 1. Extensive filling defects throughout the mid and distal thoracic esophagus, likely reflecting retained food and pills and markedly limiting mucosal evaluation. An obstructing distal esophageal mass or stricture cannot be excluded. Endoscopy should be considered. 2. Markedly delayed esophageal contrast transit, with only a small  amount of contrast passing into the stomach during the examination. 3. Known small hiatal hernia. Electronically Signed   By: Jackey Loge D.O.   On: 03/14/2023 15:38    Assessment and Plan  Compton A Alcock Montez Hageman. is a 67 y.o. Caucasian male with medical history significant for asthma, COPD, CHF, coronary artery disease, depression, gout, type 2 diabetes mellitus, hypertension, dyslipidemia, PVD and OSA on CPAP, who presented to the emergency room with dysphagia which has been going on over the last several weeks including both solids and liquids   Outpatient barium swallow revealed the following: 1. Extensive filling defects throughout the mid and distal thoracic esophagus, likely reflecting retained food and pills and markedly limiting mucosal evaluation. An obstructing distal esophageal mass or stricture cannot be excluded. Endoscopy should be considered. 2. Markedly delayed esophageal contrast transit, with only a small amount of contrast passing into the stomach during the examination. 3. Known small hiatal hernia.  Dysphagia due to Obstructing Esophageal mass S/p EGD which shows some food impaction which was removed.  Also found to have a large occluding mass at GE junction.  Biopsy were taken, highly concerning for malignancy. GI is concerned that patient will need long-term TPN as he will not be a candidate for PEG tube placement due to occlusion of esophagus. -Oncology Dr Durward Parcel-- patient not a surgical candidate. -- Gen. surgery Dr. Hazle Quant to place Jejunostomy peg tube on Monday   AKI (acute kidney injury) (HCC) - This is likely all due to diminished p.o. intake. Creatinine improved to 1.14 with IV fluid. -Continue with gentle IV fluid as patient might not be able to take p.o.   Hypothyroidism - holding po Synthroid.   Essential hypertension - holding PO meds till peg placed  PAD (peripheral artery disease) (HCC) S/p bilateral BKA. Not on any antiplatelet or statin  at home   Atrial fibrillation, chronic (HCC) - Cardizem CD and amiodarone to be resumed after peg placement -- will switch to Lovenox 1 mg per kilogram bridge till peg tube is placed -- eliquis on hold   Type 2 diabetes mellitus with peripheral neuropathy (HCC) - Patient will be placed on supplemental coverage with NovoLog. - continue basal coverage. - continue Neurontin.   BPH (benign prostatic hyperplasia) - continue Flomax.   Coronary artery disease - patient will be seen by cardiology Dr. Mariah Milling for preop clearance.  Procedures: EGD Family communication : none today. Patient will discuss with wife Consults : oncology, cardiology, general surgery CODE STATUS: full DVT Prophylaxis : Lovenox Level of care: Med-Surg Status is: Inpatient Remains inpatient appropriate because: new diagnosis of possible esophageal mass, peg tube placement to be done on Monday    TOTAL TIME TAKING CARE OF THIS PATIENT: 40 minutes.  >50% time spent on counselling and coordination of care  Note: This dictation was prepared with Dragon dictation along with smaller phrase technology. Any transcriptional errors that result from this process are unintentional.  Enedina Finner M.D    [facility]   CC: Primary care physician; [name], MD

## 2023-03-16 NOTE — Plan of Care (Signed)

## 2023-03-17 ENCOUNTER — Inpatient Hospital Stay (HOSPITAL_COMMUNITY)
Admit: 2023-03-17 | Discharge: 2023-03-17 | Disposition: A | Discharge status: Home / Self Care | Payer: Medicare Other | Attending: Cardiovascular Disease | Admitting: Cardiovascular Disease

## 2023-03-17 DIAGNOSIS — I2510 Atherosclerotic heart disease of native coronary artery without angina pectoris: Secondary | ICD-10-CM | POA: Diagnosis not present

## 2023-03-17 DIAGNOSIS — I480 Paroxysmal atrial fibrillation: Secondary | ICD-10-CM

## 2023-03-17 DIAGNOSIS — Z951 Presence of aortocoronary bypass graft: Secondary | ICD-10-CM

## 2023-03-17 DIAGNOSIS — Z794 Long term (current) use of insulin: Secondary | ICD-10-CM

## 2023-03-17 DIAGNOSIS — I739 Peripheral vascular disease, unspecified: Secondary | ICD-10-CM | POA: Diagnosis not present

## 2023-03-17 DIAGNOSIS — I10 Essential (primary) hypertension: Secondary | ICD-10-CM | POA: Diagnosis not present

## 2023-03-17 DIAGNOSIS — K2289 Other specified disease of esophagus: Secondary | ICD-10-CM | POA: Diagnosis not present

## 2023-03-17 DIAGNOSIS — Z01810 Encounter for preprocedural cardiovascular examination: Secondary | ICD-10-CM | POA: Diagnosis not present

## 2023-03-17 DIAGNOSIS — R1319 Other dysphagia: Secondary | ICD-10-CM | POA: Diagnosis not present

## 2023-03-17 DIAGNOSIS — I482 Chronic atrial fibrillation, unspecified: Secondary | ICD-10-CM | POA: Diagnosis not present

## 2023-03-17 LAB — GLUCOSE, CAPILLARY
Glucose-Capillary: 91 mg/dL (ref 70–99)
Glucose-Capillary: 105 mg/dL — ABNORMAL HIGH (ref 70–99)
Glucose-Capillary: 91 mg/dL (ref 70–99)
Glucose-Capillary: 102 mg/dL — ABNORMAL HIGH (ref 70–99)
Glucose-Capillary: 103 mg/dL — ABNORMAL HIGH (ref 70–99)
Glucose-Capillary: 94 mg/dL (ref 70–99)
Glucose-Capillary: 92 mg/dL (ref 70–99)

## 2023-03-17 LAB — ECHOCARDIOGRAM COMPLETE
AR max vel: 2.27 cm2
AV Peak grad: 11.3 mm[Hg]
Ao pk vel: 1.68 m/s
Area-P 1/2: 3.54 cm2
Height: 76 in
S' Lateral: 3.7 cm
Weight: 4761.94 [oz_av]

## 2023-03-17 NOTE — Plan of Care (Signed)
  Problem: Health Behavior/Discharge Planning: Goal: Ability to manage health-related needs will improve Outcome: Progressing   Problem: Activity: Goal: Risk for activity intolerance will decrease Outcome: Progressing   Problem: Coping: Goal: Level of anxiety will decrease Outcome: Progressing   Problem: Nutrition: Goal: Adequate nutrition will be maintained Outcome: Not Progressing   

## 2023-03-17 NOTE — Plan of Care (Signed)
  Problem: Education: Goal: Knowledge of General Education information will improve Description: Including pain rating scale, medication(s)/side effects and non-pharmacologic comfort measures 03/17/2023 0631 by Lavonna Rua I, RN Outcome: Progressing 03/17/2023 0631 by Lavonna Rua I, RN Outcome: Progressing   Problem: Clinical Measurements: Goal: Ability to maintain clinical measurements within normal limits will improve Outcome: Progressing Goal: Will remain free from infection Outcome: Progressing Goal: Diagnostic test results will improve Outcome: Progressing Goal: Cardiovascular complication will be avoided Outcome: Progressing   Problem: Activity: Goal: Risk for activity intolerance will decrease Outcome: Progressing   Problem: Nutrition: Goal: Adequate nutrition will be maintained Outcome: Progressing

## 2023-03-17 NOTE — Progress Notes (Signed)
Patient ID: [name]., male   DOB: 03-21-1956, 67 y.o.   MRN: [id number]     SURGICAL PROGRESS NOTE   Hospital Day(s): 3.   Interval History: Patient seen and examined, no acute events or new complaints overnight. Patient reports feeling well this morning.  Patient denies abdominal pain.  Patient denies any nausea or vomiting.  Patient denies chest pain.  Patient denies shortness of breath.  Patient was evaluated by medical oncology yesterday.  The plan is to proceed with neoadjuvant chemoradiation for esophageal mass.  Medical oncology recommended feeding tube due to obstructing esophageal mass.  Vital signs in last 24 hours: [min-max] current  Temp:  [97.7 F (36.5 C)-98.3 F (36.8 C)] 98.3 F (36.8 C) (11/16 1525) Pulse Rate:  [71-79] 79 (11/17 0436) Resp:  [17] 17 (11/16 1525) BP: (116-131)/(71-77) 131/77 (11/17 0436) SpO2:  [89 %-97 %] 89 % (11/17 0436)     Height: 6\' 4"  (193 cm) Weight: 135 kg BMI (Calculated): 36.24   Physical Exam:  Constitutional: alert, cooperative and no distress  Respiratory: breathing non-labored at rest  Cardiovascular: regular rate and sinus rhythm  Gastrointestinal: soft, non-tender, and non-distended  Labs:     Latest Ref Rng & Units 03/15/2023    4:02 AM 03/14/2023    4:18 PM 06/06/2022    2:59 AM  CBC  WBC 4.0 - 10.5 K/uL 6.7  7.3  9.8   Hemoglobin 13.0 - 17.0 g/dL 40.9  81.1  8.5   Hematocrit 39.0 - 52.0 % 36.1  42.4  27.6   Platelets 150 - 400 K/uL 160  170  310       Latest Ref Rng & Units 03/15/2023    4:02 AM 03/14/2023    4:18 PM 08/13/2022    2:12 PM  CMP  Glucose 70 - 99 mg/dL 914  782    BUN 8 - 23 mg/dL 27  25    Creatinine 9.56 - 1.24 mg/dL 2.13  0.86  5.78   Sodium 135 - 145 mmol/L 138  137    Potassium 3.5 - 5.1 mmol/L 3.7  4.4    Chloride 98 - 111 mmol/L 100  98    CO2 22 - 32 mmol/L 30  30    Calcium 8.9 - 10.3 mg/dL 8.5  8.9    Total Protein 6.5 - 8.1 g/dL  7.3    Total Bilirubin <1.2 mg/dL  0.4     Alkaline Phos 38 - 126 U/L  122    AST 15 - 41 U/L  15    ALT 0 - 44 U/L  19      Imaging studies: No new pertinent imaging studies   Assessment/Plan:  67 y.o. male with esophageal mass, complicated by pertinent comorbidities including coronary artery disease, CHF, COPD, asthma, severe peripheral vascular disease, type 2 diabetes, obstructive sleep apnea on CPAP.   -Patient with esophageal mass with obstruction.  Patient is strictly NPO.  He is basically not tolerating solid or liquid diet -This is highly suspicious for malignancy.  Despite the final results of the biopsy patient still needed an alternative route for nutrition -I discussed with patient about gastrostomy tube placement.  I discussed with patient the procedure.  I discussed the risk including bleeding, infection, leak from the stomach, dislodgment of the tube malfunctioning of the tube infection of the wound, among others.  The patient reported he understood and agreed to proceed with gastrostomy tube placement. -Will schedule patient for tomorrow.  Gae Gallop, MD

## 2023-03-17 NOTE — Progress Notes (Signed)
[employer]   DOB:07/21/1955   MV#:[id number]    Subjective: Patient resting in the bed.  Denies any pain.  Continues to have difficulty swallowing.  Status post evaluation with cardiology and also status post evaluation with surgery.  Objective:  Vitals:   03/17/23 0914 03/17/23 1824  BP: 138/73 126/78  Pulse: 77 72  Resp: 18 16  Temp: 97.8 F (36.6 C) 98.7 F (37.1 C)  SpO2: 90% 94%     Intake/Output Summary (Last 24 hours) at 03/17/2023 1919 Last data filed at 03/17/2023 0555 Gross per 24 hour  Intake --  Output 500 ml  Net -500 ml  Bilateral BKA.  Physical Exam Vitals and nursing note reviewed.  HENT:     Head: Normocephalic and atraumatic.     Mouth/Throat:     Pharynx: Oropharynx is clear.  Eyes:     Extraocular Movements: Extraocular movements intact.     Pupils: Pupils are equal, round, and reactive to light.  Cardiovascular:     Rate and Rhythm: Normal rate and regular rhythm.  Pulmonary:     Comments: Decreased breath sounds bilaterally.  Abdominal:     Palpations: Abdomen is soft.  Musculoskeletal:        General: Normal range of motion.     Cervical back: Normal range of motion.  Skin:    General: Skin is warm.  Neurological:     General: No focal deficit present.     Mental Status: He is alert and oriented to person, place, and time.  Psychiatric:        Behavior: Behavior normal.        Judgment: Judgment normal.      Labs:  Lab Results  Component Value Date   WBC 6.7 03/15/2023   HGB 11.4 (L) 03/15/2023   HCT 36.1 (L) 03/15/2023   MCV 93.8 03/15/2023   PLT 160 03/15/2023   NEUTROABS 4.7 03/14/2023    Lab Results  Component Value Date   NA 138 03/15/2023   K 3.7 03/15/2023   CL 100 03/15/2023   CO2 30 03/15/2023    Studies:  ECHOCARDIOGRAM COMPLETE  Result Date: 03/17/2023    ECHOCARDIOGRAM REPORT   Patient Name:   Alec Burdock A Swader Jr. Date of Exam: 03/17/2023 Medical Rec #:  [id number]             Height:       76.0  in Accession #:    [id number]            Weight:       297.6 lb Date of Birth:  1955/08/05            BSA:          2.623 m Patient Age:    67 years              BP:           138/73 mmHg Patient Gender: M                     HR:           76 bpm. Exam Location:  [facility] Procedure: 2D Echo and Strain Analysis Indications:     CAD Native Vessel I25.10  History:         Patient has prior history of Echocardiogram examinations, most                  recent 10/05/2021.  Sonographer:  Overton Mam RDCS, FASE Referring Phys:  [id number] Antonieta Iba Diagnosing Phys: Julien Nordmann MD  Sonographer Comments: Global longitudinal strain was attempted. IMPRESSIONS  1. Left ventricular ejection fraction, by estimation, is 55 %. The left ventricle has normal function. The left ventricle demonstrates regional wall motion abnormalities (mild hypokinesis of the basal inferior wall). There is mild left ventricular hypertrophy. Left ventricular diastolic parameters are consistent with Grade I diastolic dysfunction (impaired relaxation). The average left ventricular global longitudinal strain is -12.0 %.  2. Right ventricular systolic function is normal. The right ventricular size is normal. Tricuspid regurgitation signal is inadequate for assessing PA pressure.  3. The mitral valve is normal in structure. No evidence of mitral valve regurgitation. No evidence of mitral stenosis.  4. The aortic valve is tricuspid. There is mild calcification of the aortic valve. Aortic valve regurgitation is not visualized. Aortic valve sclerosis/calcification is present, without any evidence of aortic stenosis.  5. The inferior vena cava is normal in size with greater than 50% respiratory variability, suggesting right atrial pressure of 3 mmHg. FINDINGS  Left Ventricle: Left ventricular ejection fraction, by estimation, is 55 %. Left ventricular ejection fraction by PLAX is 57 %. The left ventricle has normal function. The left ventricle  demonstrates regional wall motion abnormalities. The average left ventricular global longitudinal strain is -12.0 %. The left ventricular internal cavity size was normal in size. There is mild left ventricular hypertrophy. Left ventricular diastolic parameters are consistent with Grade I diastolic dysfunction (impaired  relaxation). Right Ventricle: The right ventricular size is normal. No increase in right ventricular wall thickness. Right ventricular systolic function is normal. Tricuspid regurgitation signal is inadequate for assessing PA pressure. Left Atrium: Left atrial size was normal in size. Right Atrium: Right atrial size was normal in size. Pericardium: There is no evidence of pericardial effusion. Mitral Valve: The mitral valve is normal in structure. No evidence of mitral valve regurgitation. No evidence of mitral valve stenosis. Tricuspid Valve: The tricuspid valve is normal in structure. Tricuspid valve regurgitation is mild . No evidence of tricuspid stenosis. Aortic Valve: The aortic valve is tricuspid. There is mild calcification of the aortic valve. Aortic valve regurgitation is not visualized. Aortic valve sclerosis/calcification is present, without any evidence of aortic stenosis. Aortic valve peak gradient measures 11.3 mmHg. Pulmonic Valve: The pulmonic valve was normal in structure. Pulmonic valve regurgitation is not visualized. No evidence of pulmonic stenosis. Aorta: The aortic root is normal in size and structure. Venous: The inferior vena cava is normal in size with greater than 50% respiratory variability, suggesting right atrial pressure of 3 mmHg. IAS/Shunts: No atrial level shunt detected by color flow Doppler.  LEFT VENTRICLE PLAX 2D LV EF:         Left            Diastology                ventricular     LV e' medial:    6.64 cm/s                ejection        LV E/e' medial:  11.2                fraction by     LV e' lateral:   9.79 cm/s                PLAX is 57      LV E/e'  lateral: 7.6                %.  LVIDd:         5.30 cm         2D LVIDs:         3.70 cm         Longitudinal LV PW:         1.40 cm         Strain LV IVS:        1.40 cm         2D Strain GLS  -12.0 % LVOT diam:     2.35 cm         Avg: LV SV:         83 LV SV Index:   32 LVOT Area:     4.34 cm  RIGHT VENTRICLE RV Basal diam:  3.80 cm RV S prime:     9.57 cm/s TAPSE (M-mode): 1.8 cm LEFT ATRIUM             Index        RIGHT ATRIUM           Index LA diam:        3.80 cm 1.45 cm/m   RA Area:     19.30 cm LA Vol (A2C):   64.8 ml 24.71 ml/m  RA Volume:   56.30 ml  21.46 ml/m LA Vol (A4C):   46.0 ml 17.54 ml/m LA Biplane Vol: 59.6 ml 22.72 ml/m  AORTIC VALVE                 PULMONIC VALVE AV Area (Vmax): 2.27 cm     PV Vmax:        0.87 m/s AV Vmax:        168.00 cm/s  PV Peak grad:   3.0 mmHg AV Peak Grad:   11.3 mmHg    RVOT Peak grad: 4 mmHg LVOT Vmax:      87.80 cm/s LVOT Vmean:     58.600 cm/s LVOT VTI:       0.192 m  AORTA Ao Root diam: 3.70 cm Ao Asc diam:  3.10 cm MITRAL VALVE MV Area (PHT): 3.54 cm    SHUNTS MV Decel Time: 214 msec    Systemic VTI:  0.19 m MV E velocity: 74.40 cm/s  Systemic Diam: 2.35 cm MV A velocity: 97.50 cm/s MV E/A ratio:  0.76 Julien Nordmann MD Electronically signed by Julien Nordmann MD Signature Date/Time: 03/17/2023/2:49:50 PM    Final    CT CHEST ABDOMEN PELVIS W CONTRAST  Result Date: 03/16/2023 CLINICAL DATA:  67 year old male with history of esophageal cancer. * Tracking Code: BO * EXAM: CT CHEST, ABDOMEN, AND PELVIS WITH CONTRAST TECHNIQUE: Multidetector CT imaging of the chest, abdomen and pelvis was performed following the standard protocol during bolus administration of intravenous contrast. RADIATION DOSE REDUCTION: This exam was performed according to the departmental dose-optimization program which includes automated exposure control, adjustment of the mA and/or kV according to patient size and/or use of iterative reconstruction technique. CONTRAST:   OMNIPAQUE IOHEXOL 300 MG/ML  SOLN COMPARISON:  CT of the abdomen and pelvis 08/13/2022. Chest CTA 09/10/2013. FINDINGS: CT CHEST FINDINGS Cardiovascular: Heart size is normal. There is no significant pericardial fluid, thickening or pericardial calcification. Atherosclerotic calcifications are noted in the thoracic aorta as well as the left anterior descending, left circumflex and right coronary arteries. Status post median sternotomy for CABG including LIMA to the LAD. Mediastinum/Nodes: No pathologically enlarged mediastinal or hilar lymph nodes. Extensive thickening of the distal esophagus measuring  up to 1.7 cm in thickness shortly before the gastroesophageal junction where there is also haziness in the paraesophageal fat of the middle mediastinum, concerning for potential esophageal neoplasm. No axillary lymphadenopathy. Lungs/Pleura: No acute consolidative airspace disease. No pleural effusions. No definite suspicious appearing pulmonary nodules or masses are noted. Scattered areas of mild scarring are noted in the lung bases bilaterally. Musculoskeletal: Median sternotomy wires. There are no aggressive appearing lytic or blastic lesions noted in the visualized portions of the skeleton. CT ABDOMEN PELVIS FINDINGS Hepatobiliary: No suspicious cystic or solid hepatic lesions. No intra or extrahepatic biliary ductal dilatation. Gallbladder is unremarkable in appearance. Pancreas: No pancreatic mass. No pancreatic ductal dilatation. No pancreatic or peripancreatic fluid collections or inflammatory changes. Spleen: Unremarkable. Adrenals/Urinary Tract: Exophytic 1.7 cm low-attenuation lesion in the posterior aspect of the interpolar region of the left kidney, compatible with a simple cyst. Centrally low-attenuation peripherally thick walled right-sided perinephric fluid collection (axial image 84 of series 2) measuring 5.0 x 3.5 cm, tracking caudally along the right retroperitoneum anterior to the iliopsoas  musculature, likely a chronic and unresolved hematoma, although abscess is not entirely excluded. The inferior aspect of this appears larger than prior studies (axial image 91 of series 2) measuring up to 6.6 x 2.9 cm just below the lower pole of the right kidney. No hydroureteronephrosis. Urinary bladder is unremarkable in appearance. Mild nodular thickening of the adrenal glands bilaterally, stable compared to the prior study and nonspecific. Stomach/Bowel: Stomach is nearly completely decompressed, but otherwise unremarkable in appearance. No pathologic dilatation of small bowel or colon. Numerous colonic diverticula are noted, without surrounding inflammatory changes to suggest an acute diverticulitis at this time. Normal appendix. Vascular/Lymphatic: Atherosclerosis in the abdominal aorta and pelvic vasculature, without evidence of aneurysm or dissection in the abdominal or pelvic vasculature. Multiple prominent borderline enlarged retroperitoneal lymph nodes, nonspecific. No definite pathologically enlarged lymph nodes in the abdomen or pelvis. Reproductive: Prostate gland and seminal vesicles are unremarkable in appearance. Other: No significant volume of ascites.  No pneumoperitoneum. Musculoskeletal: There are no aggressive appearing lytic or blastic lesions noted in the visualized portions of the skeleton. IMPRESSION: 1. Circumferential mass-like thickening of the distal esophagus corresponding to reported esophageal neoplasm. Haziness in the paraesophageal fat could reflect early local invasion. No definitive evidence of metastatic disease confidently identified. 2. Chronic perinephric fluid collection adjacent to the right kidney appears larger than the prior examination, now extending caudally in the retroperitoneum. Whether this represents a chronic unresolved hematoma or abscess is uncertain. No internal gas to clearly indicate infection at this time. Further clinical evaluation is recommended. 3.  Aortic atherosclerosis, in addition to three-vessel coronary artery disease. Status post median sternotomy for CABG including LIMA to the LAD. 4. Colonic diverticulosis without evidence of acute diverticulitis at this time. 5. Additional incidental findings, as above. Electronically Signed   By: Trudie Reed M.D.   On: 03/16/2023 08:00    67 y.o. male patient with a history of smoking  CAD' A.fib  DM/peripheral vascular disease status post bilateral amputations currently admitted to hospital for dysphagia noted to have Esophagus mass.    # GE junction mass-imaging findings highly concerning for malignancy-status post GI evaluation/EGD.  Pathology pending.-CT scan chest and pelvis-lower esophagus mass noted-possible involvement of the paraesophageal fat/concerning for early invasion of the mediastinum.  Otherwise no pathologic lymph node noted or evidence of distant metastatic disease.  Clinically not resectable.  Patient would benefit from chemoradiation.  Discussed regarding port placement in consideration of  upcoming chemotherapy.  Patient is interested.  Informed Dr. Maia Plan.   # mild Anemia: Likely due to GI bleed/secondary to #1   # ECOG performance status:2.  bil BKA- [sec to DM/ PVD; June 2023]- in wheel chair   # CAD/A.fib -currently on Lovenox [Dr.Arida]-given upcoming plan for feeding tube placement.  Evaluation with cardiology.   # DM- [O conell]Hb A1c- 7.2.    # Weight loss/malnutrition; agree with feeding tube placement.-Awaiting placement on 1118 Dr. Maia Plan.   # Plan of care was discussed with Dr. Allena Katz; Dr. Maia Plan.  [name], MD 03/17/2023  7:19 PM

## 2023-03-17 NOTE — Progress Notes (Signed)
  Echocardiogram 2D Echocardiogram has been performed.  Lenor Coffin 03/17/2023, 1:25 PM

## 2023-03-17 NOTE — Plan of Care (Signed)
  Problem: Education: Goal: Knowledge of General Education information will improve Description: Including pain rating scale, medication(s)/side effects and non-pharmacologic comfort measures Outcome: Progressing   Problem: Health Behavior/Discharge Planning: Goal: Ability to manage health-related needs will improve Outcome: Progressing   Problem: Clinical Measurements: Goal: Ability to maintain clinical measurements within normal limits will improve Outcome: Progressing Goal: Will remain free from infection Outcome: Progressing Goal: Diagnostic test results will improve Outcome: Progressing Goal: Respiratory complications will improve Outcome: Progressing Goal: Cardiovascular complication will be avoided Outcome: Progressing   Problem: Activity: Goal: Risk for activity intolerance will decrease Outcome: Progressing   Problem: Nutrition: Goal: Adequate nutrition will be maintained Outcome: Progressing   Problem: Coping: Goal: Level of anxiety will decrease Outcome: Progressing   Problem: Elimination: Goal: Will not experience complications related to bowel motility Outcome: Progressing Goal: Will not experience complications related to urinary retention Outcome: Progressing   Problem: Pain Management: Goal: General experience of comfort will improve Outcome: Progressing   Problem: Safety: Goal: Ability to remain free from injury will improve Outcome: Progressing   Problem: Skin Integrity: Goal: Risk for impaired skin integrity will decrease Outcome: Progressing   Problem: Education: Goal: Knowledge of General Education information will improve Description: Including pain rating scale, medication(s)/side effects and non-pharmacologic comfort measures Outcome: Progressing   Problem: Health Behavior/Discharge Planning: Goal: Ability to manage health-related needs will improve Outcome: Progressing   Problem: Clinical Measurements: Goal: Ability to maintain  clinical measurements within normal limits will improve Outcome: Progressing Goal: Will remain free from infection Outcome: Progressing Goal: Diagnostic test results will improve Outcome: Progressing Goal: Respiratory complications will improve Outcome: Progressing Goal: Cardiovascular complication will be avoided Outcome: Progressing   Problem: Activity: Goal: Risk for activity intolerance will decrease Outcome: Progressing   Problem: Nutrition: Goal: Adequate nutrition will be maintained Outcome: Progressing   Problem: Coping: Goal: Level of anxiety will decrease Outcome: Progressing   Problem: Elimination: Goal: Will not experience complications related to bowel motility Outcome: Progressing Goal: Will not experience complications related to urinary retention Outcome: Progressing   Problem: Pain Management: Goal: General experience of comfort will improve Outcome: Progressing   Problem: Safety: Goal: Ability to remain free from injury will improve Outcome: Progressing   Problem: Skin Integrity: Goal: Risk for impaired skin integrity will decrease Outcome: Progressing   Problem: Education: Goal: Ability to describe self-care measures that may prevent or decrease complications (Diabetes Survival Skills Education) will improve Outcome: Progressing Goal: Individualized Educational Video(s) Outcome: Progressing   Problem: Coping: Goal: Ability to adjust to condition or change in health will improve Outcome: Progressing   Problem: Fluid Volume: Goal: Ability to maintain a balanced intake and output will improve Outcome: Progressing   Problem: Health Behavior/Discharge Planning: Goal: Ability to identify and utilize available resources and services will improve Outcome: Progressing Goal: Ability to manage health-related needs will improve Outcome: Progressing   Problem: Metabolic: Goal: Ability to maintain appropriate glucose levels will improve Outcome:  Progressing   Problem: Nutritional: Goal: Maintenance of adequate nutrition will improve Outcome: Progressing Goal: Progress toward achieving an optimal weight will improve Outcome: Progressing   Problem: Skin Integrity: Goal: Risk for impaired skin integrity will decrease Outcome: Progressing   Problem: Tissue Perfusion: Goal: Adequacy of tissue perfusion will improve Outcome: Progressing

## 2023-03-17 NOTE — Consult Note (Signed)
Cardiology Consultation   Patient ID: [name]. MRN: [id number]; DOB: June 11, 1955  Admit date: 03/14/2023 Date of Consult: 03/17/2023  PCP:  [name], MD   [facility] Providers Cardiologist:  Lorine Bears, MD Physician requesting consult: Enedina Finner Reason for consult: Coronary artery disease, preop cardiovascular evaluation  Patient Profile:   [name]. is a 67 y.o. male with a hx of frequent falls, bilateral amputations, atrial fibrillation with RVR, prior cardioversions last in October 2023, moderate LVH, coronary disease with CABG 2006 LIMA to the LAD, vein graft to the OM, hypertension, chronic kidney disease, obesity  History of Present Illness:   [name] has long history of coronary disease CABG in 2006, cardiac catheterization 2021 for chest pain and abnormal stress test showing an occluded vein graft to the OM, patent LIMA to the LAD, 89% mid RCA stenosis. Underwent placement of a drug-eluting stent in the RCA   Since that time reports he has been chest pain-free, denies significant shortness of breath, no leg swelling  Presenting to the emergency room March 14, 2023 with dysphagia, abnormal barium swallow test Obstructing distal esophageal mass or stricture cannot be excluded  Underwent EGD showing food impaction which was removed Large occluding mass at GE junction, biopsies taken, concerning for malignancy  Scheduled for Jejunostomy  PEG tube on Monday   Past Medical History:  Diagnosis Date   Arrhythmia    atrial fibrillation   Asthma    CHF (congestive heart failure) (HCC)    COPD (chronic obstructive pulmonary disease) (HCC)    Coronary artery disease    Depression    Diabetes mellitus without complication (HCC)    Gout    History anabolic steroid use    Hyperlipidemia    Hypertension    Hypogonadism in male    MI (myocardial infarction) (HCC)    Morbid obesity (HCC)    Myocardial infarction  (HCC)    Peripheral vascular disease (HCC)    Perirectal abscess    Pleurisy    Sleep apnea    CPAP at night, no oxygen   Varicella     Past Surgical History:  Procedure Laterality Date   ABDOMINAL AORTIC ANEURYSM REPAIR     ACHILLES TENDON SURGERY Left 01/10/2021   Procedure: ACHILLES LENGTHENING/KIDNER;  Surgeon: Rosetta Posner, DPM;  Location: [facility];  Service: Podiatry;  Laterality: Left;   AMPUTATION Left 10/03/2021   Procedure: AMPUTATION BELOW KNEE;  Surgeon: Renford Dills, MD;  Location: [facility];  Service: Vascular;  Laterality: Left;   AMPUTATION Right 05/24/2022   Procedure: AMPUTATION BELOW KNEE;  Surgeon: Annice Needy, MD;  Location: [facility];  Service: General;  Laterality: Right;   AMPUTATION TOE Right 02/10/2016   Procedure: AMPUTATION TOE 3RD TOE;  Surgeon: Gwyneth Revels, DPM;  Location: [facility];  Service: Podiatry;  Laterality: Right;   AMPUTATION TOE Left 02/24/2020   Procedure: AMPUTATION TOE;  Surgeon: Rosetta Posner, DPM;  Location: [facility];  Service: Podiatry;  Laterality: Left;   APPLICATION OF WOUND VAC Left 02/29/2020   Procedure: APPLICATION OF WOUND VAC;  Surgeon: Rosetta Posner, DPM;  Location: [facility];  Service: Podiatry;  Laterality: Left;   CARDIAC CATHETERIZATION     CARDIOVERSION [name] 02/13/2022   Procedure: CARDIOVERSION;  Surgeon: Lamar Blinks, MD;  Location: [facility];  Service: Cardiovascular;  Laterality: [name];   COLONOSCOPY WITH PROPOFOL [name] 11/18/2015   Procedure: COLONOSCOPY WITH PROPOFOL;  Surgeon: Scot Jun, MD;  Location: [facility];  Service: Endoscopy;  Laterality: [name];   CORONARY ARTERY BYPASS GRAFT     CORONARY STENT INTERVENTION [name] 02/02/2020   Procedure: CORONARY STENT INTERVENTION;  Surgeon: Marcina Millard, MD;  Location: [facility];  Service: Cardiovascular;  Laterality: [name];   INCISION AND DRAINAGE Left 08/07/2021   Procedure: INCISION AND DRAINAGE-Partial Calcanectomy;  Surgeon: Rosetta Posner,  DPM;  Location: [facility];  Service: Podiatry;  Laterality: Left;   IRRIGATION AND DEBRIDEMENT FOOT Left 02/29/2020   Procedure: IRRIGATION AND DEBRIDEMENT FOOT;  Surgeon: Rosetta Posner, DPM;  Location: [facility];  Service: Podiatry;  Laterality: Left;   IRRIGATION AND DEBRIDEMENT FOOT Left 02/24/2020   Procedure: IRRIGATION AND DEBRIDEMENT FOOT;  Surgeon: Rosetta Posner, DPM;  Location: [facility];  Service: Podiatry;  Laterality: Left;   KNEE ARTHROSCOPY     LEFT HEART CATH AND CORS/GRAFTS ANGIOGRAPHY [name] 02/02/2020   Procedure: LEFT HEART CATH AND CORS/GRAFTS ANGIOGRAPHY;  Surgeon: Dalia Heading, MD;  Location: [facility];  Service: Cardiovascular;  Laterality: [name];   LOWER EXTREMITY ANGIOGRAPHY Left 02/25/2020   Procedure: Lower Extremity Angiography;  Surgeon: Annice Needy, MD;  Location: [facility];  Service: Cardiovascular;  Laterality: Left;   LOWER EXTREMITY ANGIOGRAPHY Left 01/04/2021   Procedure: LOWER EXTREMITY ANGIOGRAPHY;  Surgeon: Annice Needy, MD;  Location: [facility];  Service: Cardiovascular;  Laterality: Left;   LOWER EXTREMITY ANGIOGRAPHY Right 05/21/2022   Procedure: Lower Extremity Angiography;  Surgeon: Annice Needy, MD;  Location: [facility];  Service: Cardiovascular;  Laterality: Right;   METATARSAL HEAD EXCISION Left 01/10/2021   Procedure: METATARSAL HEAD EXCISION - LEFT 5th;  Surgeon: Rosetta Posner, DPM;  Location: [facility];  Service: Podiatry;  Laterality: Left;   PERIPHERAL VASCULAR CATHETERIZATION Right 01/24/2016   Procedure: Lower Extremity Angiography;  Surgeon: Renford Dills, MD;  Location: [facility];  Service: Cardiovascular;  Laterality: Right;   PERIPHERAL VASCULAR CATHETERIZATION Right 01/25/2016   Procedure: Lower Extremity Angiography;  Surgeon: Renford Dills, MD;  Location: [facility];  Service: Cardiovascular;  Laterality: Right;   TOE AMPUTATION     TONSILLECTOMY       Home  Medications:  Prior to Admission medications   Medication Sig Start Date End Date Taking? Authorizing Provider  acetaminophen (TYLENOL) 325 MG tablet Take 2 tablets (650 mg total) by mouth every 6 (six) hours as needed for mild pain (or Fever >/= 101). 05/29/22  Yes Sunnie Nielsen, DO  amiodarone (PACERONE) 200 MG tablet Take 1 tablet (200 mg total) by mouth daily. 06/20/22  Yes [name], MD  apixaban (ELIQUIS) 5 MG TABS tablet Take 5 mg by mouth 2 (two) times daily.   Yes [provider]  aspirin EC 81 MG tablet Take 81 mg by mouth daily. Swallow whole.   Yes [provider]  budesonide (PULMICORT) 0.5 MG/2ML nebulizer solution Take 2 mLs (0.5 mg total) by nebulization 2 (two) times daily. 11/10/21  Yes Sunnie Nielsen, DO  Cholecalciferol 25 MCG (1000 UT) tablet Take 1,000 Units by mouth daily.   Yes [provider]  clonazePAM (KLONOPIN) 0.5 MG tablet Take 0.5 mg by mouth 2 (two) times daily.   Yes [provider]  Continuous Glucose Sensor (FREESTYLE LIBRE 3 SENSOR) MISC Use 1 sensor every 14 (fourteen) days. 01/10/23  Yes   Cyanocobalamin (VITAMIN B-12) 5000 MCG TBDP Take 10,000 mcg by mouth daily.   Yes [provider]  diltiazem (CARDIZEM CD) 300 MG 24 hr capsule Take 1 capsule (300 mg total) by mouth daily. 03/04/23  Yes [name], MD  ezetimibe (ZETIA) 10 MG tablet Take 10 mg by mouth at bedtime.   Yes [provider]  ferrous sulfate 325 (65 FE) MG tablet Take 325 mg by mouth daily with breakfast.   Yes [provider]  finasteride (PROSCAR) 5 MG tablet Take 1 tablet (5 mg total) by mouth daily. 11/11/21  Yes Sunnie Nielsen, DO  furosemide (LASIX) 20 MG tablet Take 1 tablet (20 mg total) by mouth ONCE OR TWICE daily (total daily dose maximum 40 mg) as needed for increased swelling, shortness of breath, weight gain 5+ lbs over 1-2 days. Seek medical care if these symptoms are not improving with increased  dose, or if you become dizzy or have low blood pressure. 05/29/22  Yes Sunnie Nielsen, DO  gabapentin (NEURONTIN) 300 MG capsule Take 300 mg by mouth at bedtime.   Yes [provider]  insulin glargine (LANTUS) 100 UNIT/ML injection Inject 15 units into the skin daily in AM and inject 25 units into the skin daily in PM 05/29/22  Yes Sunnie Nielsen, DO  isosorbide mononitrate (IMDUR) 60 MG 24 hr tablet Take 1 tablet (60 mg total) by mouth in the morning and at bedtime. 06/20/22  Yes [name], MD  levothyroxine (SYNTHROID) 25 MCG tablet Take 1 tablet (25 mcg total) by mouth daily at 6 (six) AM. 11/11/21  Yes Sunnie Nielsen, DO  losartan (COZAAR) 25 MG tablet Take 1 tablet (25 mg total) by mouth daily. 05/30/22  Yes Sunnie Nielsen, DO  Multiple Vitamins-Minerals (MULTIVITAMIN WITH MINERALS) tablet Take 1 tablet by mouth daily.   Yes [provider]  nitrofurantoin, macrocrystal-monohydrate, (MACROBID) 100 MG capsule Take 100 mg by mouth daily.   Yes [provider]  nitroGLYCERIN (NITROSTAT) 0.4 MG SL tablet Place 1 tablet (0.4 mg total) under the tongue every 5 (five) minutes as needed for chest pain. 06/20/22  Yes [name], MD  nystatin (MYCOSTATIN/NYSTOP) powder Apply 1 Application topically 2 (two) times daily.   Yes [provider]  ondansetron (ZOFRAN) 4 MG tablet Take 1 tablet (4 mg total) by mouth every 6 (six) hours as needed for nausea. 05/29/22  Yes Sunnie Nielsen, DO  pramipexole (MIRAPEX) 1 MG tablet Take 2 mg by mouth at bedtime.   Yes [provider]  primidone (MYSOLINE) 250 MG tablet Take 250 mg by mouth 2 (two) times daily. 01/23/20  Yes [provider]  tamsulosin (FLOMAX) 0.4 MG CAPS capsule Take 0.4 mg by mouth daily.   Yes [provider]  vitamin C (ASCORBIC ACID) 500 MG tablet Take 500 mg by mouth daily.   Yes [provider]  zinc sulfate 220 (50 Zn) MG capsule Take 1 capsule  (220 mg total) by mouth daily. 05/29/22  Yes Sunnie Nielsen, DO  Continuous Blood Gluc Sensor (FREESTYLE LIBRE 3 SENSOR) MISC  06/13/22   [provider]  HUMALOG 100 UNIT/ML injection Inject 15 Units into the skin 3 (three) times daily with meals. >200 02/05/22   [provider]  Infant Care Products [facility]) OINT Apply 1 Application topically daily as needed.    [provider]  mupirocin ointment (BACTROBAN) 2 % Apply 1 Application topically daily. 12/13/22   Georgiana Spinner, NP  nutrition supplement, JUVEN, (JUVEN) PACK Take 1 packet by mouth 2 (two) times daily between meals. 05/29/22  Sunnie Nielsen, DO  risperiDONE (RISPERDAL) 0.5 MG tablet Take 0.5 tablets (0.25 mg total) by mouth 2 (two) times daily. Patient not taking: Reported on 03/14/2023 05/29/22   Sunnie Nielsen, DO  sulfamethoxazole-trimethoprim (BACTRIM DS) 800-160 MG tablet Take 1 tablet by mouth 2 (two) times daily. Patient not taking: Reported on 03/14/2023 12/13/22   Georgiana Spinner, NP    Inpatient Medications: Scheduled Meds:  amiodarone  200 mg Oral Daily   ascorbic acid  500 mg Oral Daily   budesonide  0.5 mg Nebulization BID   cholecalciferol  1,000 Units Oral Daily   cyanocobalamin  5,000 mcg Oral Daily   diltiazem  300 mg Oral Daily   ezetimibe  10 mg Oral Daily   ferrous sulfate  325 mg Oral Q breakfast   finasteride  5 mg Oral Daily   gabapentin  300 mg Oral QHS   insulin aspart  0-15 Units Subcutaneous Q4H   isosorbide mononitrate  60 mg Oral Daily   levothyroxine  25 mcg Oral Q0600   losartan  25 mg Oral Daily   multivitamin with minerals  1 tablet Oral Daily   nutrition supplement (JUVEN)  1 packet Oral BID BM   nystatin  1 Application Topical BID   pramipexole  2 mg Oral QHS   primidone  250 mg Oral BID   tamsulosin  0.4 mg Oral Daily   zinc sulfate (50mg  elemental zinc)  220 mg Oral Daily   Continuous Infusions:  PRN Meds: acetaminophen **OR**  acetaminophen, furosemide, magnesium hydroxide, morphine injection, nitroGLYCERIN, ondansetron **OR** ondansetron (ZOFRAN) IV, traZODone  Allergies:    Allergies  Allergen Reactions   Penicillins Other (See Comments)    Happened at 67 years old and pt. stated he passed out  He has tolerated amoxicillin/clavulanate and ampicillin/sulbactam   Statins     Other reaction(s): Muscle Pain Causes legs to ache per pt   Metformin And Related Diarrhea    Social History:   Social History   Socioeconomic History   Marital status: Married    Spouse name: Not on file   Number of children: Not on file   Years of education: Not on file   Highest education level: Not on file  Occupational History   Not on file  Tobacco Use   Smoking status: Former    Current packs/day: 0.00    Average packs/day: 0.5 packs/day for 45.0 years (22.5 ttl pk-yrs)    Types: Cigarettes    Start date: 04/04/1970    Quit date: 04/05/2015    Years since quitting: 7.9   Smokeless tobacco: Never  Vaping Use   Vaping status: Every Day  Substance and Sexual Activity   Alcohol use: Not Currently    Alcohol/week: 3.0 standard drinks of alcohol    Types: 3 Glasses of wine per week   Drug use: No   Sexual activity: Not on file  Other Topics Concern   Not on file  Social History Narrative   Not on file   Social Determinants of Health   Financial Resource Strain: Low Risk  (09/28/2022)   Received from [facility]   Overall Financial Resource Strain (CARDIA)    Difficulty of Paying Living Expenses: Not hard at all  Food Insecurity: No Food Insecurity (03/15/2023)   Hunger Vital Sign    Worried About Running Out of Food in the Last Year: Never true    Ran Out of Food in the Last Year: Never  true  Transportation Needs: No Transportation Needs (03/15/2023)   PRAPARE - Administrator, Civil Service (Medical): No    Lack of Transportation (Non-Medical): No   Physical Activity: Not on file  Stress: Not on file  Social Connections: Not on file  Intimate Partner Violence: Not At Risk (03/15/2023)   Humiliation, Afraid, Rape, and Kick questionnaire    Fear of Current or Ex-Partner: No    Emotionally Abused: No    Physically Abused: No    Sexually Abused: No    Family History:   Family History  Problem Relation Age of Onset   Hypertension Father    Coronary artery disease Father    Alcohol abuse Father    Heart failure Brother      ROS:  Please see the history of present illness.  Review of Systems  Constitutional: Negative.   HENT: Negative.    Respiratory: Negative.    Cardiovascular: Negative.   Gastrointestinal: Negative.        Dysphagia  Musculoskeletal: Negative.   Neurological: Negative.   Psychiatric/Behavioral: Negative.    All other systems reviewed and are negative.  Physical Exam/Data:   Vitals:   03/16/23 1525 03/17/23 0436 03/17/23 0738 03/17/23 0914  BP: 116/75 131/77  138/73  Pulse: 71 79  77  Resp: 17   18  Temp: 98.3 F (36.8 C)   97.8 F (36.6 C)  TempSrc:      SpO2: 96% (!) 89% 94% 90%  Weight:      Height:        Intake/Output Summary (Last 24 hours) at 03/17/2023 1652 Last data filed at 03/17/2023 0555 Gross per 24 hour  Intake --  Output 500 ml  Net -500 ml      03/15/2023    6:50 AM 11/20/2022    2:05 PM 08/24/2022   11:21 AM  Last 3 Weights  Weight (lbs) 297 lb 9.9 oz -- 299 lb  Weight (kg) 135 kg -- 135.626 kg     Body mass index is 36.23 kg/m.  General:  Well nourished, well developed, in no acute distress HEENT: normal Neck: no JVD Vascular: No carotid bruits; Distal pulses 2+ bilaterally Cardiac:  normal S1, S2; RRR; no murmur  Lungs:  clear to auscultation bilaterally, no wheezing, rhonchi or rales  Abd: soft, nontender, no hepatomegaly  Ext: no edema Musculoskeletal:  No deformities, BUE and BLE strength normal and equal Skin: warm and dry  Neuro:  CNs 2-12 intact, no  focal abnormalities noted Psych:  Normal affect   EKG:  The EKG was personally reviewed and demonstrates:   EKG from July 2024 showing right bundle branch block normal sinus rhythm Repeat EKG ordered  Relevant CV Studies: Echocardiogram with ejection fraction 50 to 55%, unchanged from prior studies  Laboratory Data:  High Sensitivity Troponin:  No results for input(s): "TROPONINIHS" in the last 720 hours.   Chemistry Recent Labs  Lab 03/14/23 1618 03/15/23 0402  NA 137 138  K 4.4 3.7  CL 98 100  CO2 30 30  GLUCOSE 193* 188*  BUN 25* 27*  CREATININE 1.27* 1.14  CALCIUM 8.9 8.5*  GFRNONAA >60 >60  ANIONGAP 9 8    Recent Labs  Lab 03/14/23 1618  PROT 7.3  ALBUMIN 3.7  AST 15  ALT 19  ALKPHOS 122  BILITOT 0.4   Lipids No results for input(s): "CHOL", "TRIG", "HDL", "LABVLDL", "LDLCALC", "CHOLHDL" in the last 168 hours.  Hematology Recent Labs  Lab  03/14/23 1618 03/15/23 0402  WBC 7.3 6.7  RBC 4.43 3.85*  HGB 13.0 11.4*  HCT 42.4 36.1*  MCV 95.7 93.8  MCH 29.3 29.6  MCHC 30.7 31.6  RDW 14.2 14.4  PLT 170 160   Thyroid No results for input(s): "TSH", "FREET4" in the last 168 hours.  BNPNo results for input(s): "BNP", "PROBNP" in the last 168 hours.  DDimer No results for input(s): "DDIMER" in the last 168 hours.   Radiology/Studies:  ECHOCARDIOGRAM COMPLETE  Result Date: 03/17/2023    ECHOCARDIOGRAM REPORT   Patient Name:   Alec Sielski. Date of Exam: 03/17/2023 Medical Rec #:  [id number]             Height:       76.0 in Accession #:    [id number]            Weight:       297.6 lb Date of Birth:  24-Nov-1955            BSA:          2.623 m Patient Age:    67 years              BP:           138/73 mmHg Patient Gender: M                     HR:           76 bpm. Exam Location:  [facility] Procedure: 2D Echo and Strain Analysis Indications:     CAD Native Vessel I25.10  History:         Patient has prior history of Echocardiogram examinations, most                   recent 10/05/2021.  Sonographer:     Overton Mam RDCS, FASE Referring Phys:  [id number] Antonieta Iba Diagnosing Phys: Julien Nordmann MD  Sonographer Comments: Global longitudinal strain was attempted. IMPRESSIONS  1. Left ventricular ejection fraction, by estimation, is 55 %. The left ventricle has normal function. The left ventricle demonstrates regional wall motion abnormalities (mild hypokinesis of the basal inferior wall). There is mild left ventricular hypertrophy. Left ventricular diastolic parameters are consistent with Grade I diastolic dysfunction (impaired relaxation). The average left ventricular global longitudinal strain is -12.0 %.  2. Right ventricular systolic function is normal. The right ventricular size is normal. Tricuspid regurgitation signal is inadequate for assessing PA pressure.  3. The mitral valve is normal in structure. No evidence of mitral valve regurgitation. No evidence of mitral stenosis.  4. The aortic valve is tricuspid. There is mild calcification of the aortic valve. Aortic valve regurgitation is not visualized. Aortic valve sclerosis/calcification is present, without any evidence of aortic stenosis.  5. The inferior vena cava is normal in size with greater than 50% respiratory variability, suggesting right atrial pressure of 3 mmHg. FINDINGS  Left Ventricle: Left ventricular ejection fraction, by estimation, is 55 %. Left ventricular ejection fraction by PLAX is 57 %. The left ventricle has normal function. The left ventricle demonstrates regional wall motion abnormalities. The average left ventricular global longitudinal strain is -12.0 %. The left ventricular internal cavity size was normal in size. There is mild left ventricular hypertrophy. Left ventricular diastolic parameters are consistent with Grade I diastolic dysfunction (impaired  relaxation). Right Ventricle: The right ventricular size is normal. No increase in right ventricular wall thickness. Right  ventricular systolic function is  normal. Tricuspid regurgitation signal is inadequate for assessing PA pressure. Left Atrium: Left atrial size was normal in size. Right Atrium: Right atrial size was normal in size. Pericardium: There is no evidence of pericardial effusion. Mitral Valve: The mitral valve is normal in structure. No evidence of mitral valve regurgitation. No evidence of mitral valve stenosis. Tricuspid Valve: The tricuspid valve is normal in structure. Tricuspid valve regurgitation is mild . No evidence of tricuspid stenosis. Aortic Valve: The aortic valve is tricuspid. There is mild calcification of the aortic valve. Aortic valve regurgitation is not visualized. Aortic valve sclerosis/calcification is present, without any evidence of aortic stenosis. Aortic valve peak gradient measures 11.3 mmHg. Pulmonic Valve: The pulmonic valve was normal in structure. Pulmonic valve regurgitation is not visualized. No evidence of pulmonic stenosis. Aorta: The aortic root is normal in size and structure. Venous: The inferior vena cava is normal in size with greater than 50% respiratory variability, suggesting right atrial pressure of 3 mmHg. IAS/Shunts: No atrial level shunt detected by color flow Doppler.  LEFT VENTRICLE PLAX 2D LV EF:         Left            Diastology                ventricular     LV e' medial:    6.64 cm/s                ejection        LV E/e' medial:  11.2                fraction by     LV e' lateral:   9.79 cm/s                PLAX is 57      LV E/e' lateral: 7.6                %. LVIDd:         5.30 cm         2D LVIDs:         3.70 cm         Longitudinal LV PW:         1.40 cm         Strain LV IVS:        1.40 cm         2D Strain GLS  -12.0 % LVOT diam:     2.35 cm         Avg: LV SV:         83 LV SV Index:   32 LVOT Area:     4.34 cm  RIGHT VENTRICLE RV Basal diam:  3.80 cm RV S prime:     9.57 cm/s TAPSE (M-mode): 1.8 cm LEFT ATRIUM             Index        RIGHT ATRIUM            Index LA diam:        3.80 cm 1.45 cm/m   RA Area:     19.30 cm LA Vol (A2C):   64.8 ml 24.71 ml/m  RA Volume:   56.30 ml  21.46 ml/m LA Vol (A4C):   46.0 ml 17.54 ml/m LA Biplane Vol: 59.6 ml 22.72 ml/m  AORTIC VALVE                 PULMONIC VALVE AV Area (  Vmax): 2.27 cm     PV Vmax:        0.87 m/s AV Vmax:        168.00 cm/s  PV Peak grad:   3.0 mmHg AV Peak Grad:   11.3 mmHg    RVOT Peak grad: 4 mmHg LVOT Vmax:      87.80 cm/s LVOT Vmean:     58.600 cm/s LVOT VTI:       0.192 m  AORTA Ao Root diam: 3.70 cm Ao Asc diam:  3.10 cm MITRAL VALVE MV Area (PHT): 3.54 cm    SHUNTS MV Decel Time: 214 msec    Systemic VTI:  0.19 m MV E velocity: 74.40 cm/s  Systemic Diam: 2.35 cm MV A velocity: 97.50 cm/s MV E/A ratio:  0.76 Julien Nordmann MD Electronically signed by Julien Nordmann MD Signature Date/Time: 03/17/2023/2:49:50 PM    Final    CT CHEST ABDOMEN PELVIS W CONTRAST  Result Date: 03/16/2023 CLINICAL DATA:  67 year old male with history of esophageal cancer. * Tracking Code: BO * EXAM: CT CHEST, ABDOMEN, AND PELVIS WITH CONTRAST TECHNIQUE: Multidetector CT imaging of the chest, abdomen and pelvis was performed following the standard protocol during bolus administration of intravenous contrast. RADIATION DOSE REDUCTION: This exam was performed according to the departmental dose-optimization program which includes automated exposure control, adjustment of the mA and/or kV according to patient size and/or use of iterative reconstruction technique. CONTRAST:  OMNIPAQUE IOHEXOL 300 MG/ML  SOLN COMPARISON:  CT of the abdomen and pelvis 08/13/2022. Chest CTA 09/10/2013. FINDINGS: CT CHEST FINDINGS Cardiovascular: Heart size is normal. There is no significant pericardial fluid, thickening or pericardial calcification. Atherosclerotic calcifications are noted in the thoracic aorta as well as the left anterior descending, left circumflex and right coronary arteries. Status post median sternotomy for CABG  including LIMA to the LAD. Mediastinum/Nodes: No pathologically enlarged mediastinal or hilar lymph nodes. Extensive thickening of the distal esophagus measuring up to 1.7 cm in thickness shortly before the gastroesophageal junction where there is also haziness in the paraesophageal fat of the middle mediastinum, concerning for potential esophageal neoplasm. No axillary lymphadenopathy. Lungs/Pleura: No acute consolidative airspace disease. No pleural effusions. No definite suspicious appearing pulmonary nodules or masses are noted. Scattered areas of mild scarring are noted in the lung bases bilaterally. Musculoskeletal: Median sternotomy wires. There are no aggressive appearing lytic or blastic lesions noted in the visualized portions of the skeleton. CT ABDOMEN PELVIS FINDINGS Hepatobiliary: No suspicious cystic or solid hepatic lesions. No intra or extrahepatic biliary ductal dilatation. Gallbladder is unremarkable in appearance. Pancreas: No pancreatic mass. No pancreatic ductal dilatation. No pancreatic or peripancreatic fluid collections or inflammatory changes. Spleen: Unremarkable. Adrenals/Urinary Tract: Exophytic 1.7 cm low-attenuation lesion in the posterior aspect of the interpolar region of the left kidney, compatible with a simple cyst. Centrally low-attenuation peripherally thick walled right-sided perinephric fluid collection (axial image 84 of series 2) measuring 5.0 x 3.5 cm, tracking caudally along the right retroperitoneum anterior to the iliopsoas musculature, likely a chronic and unresolved hematoma, although abscess is not entirely excluded. The inferior aspect of this appears larger than prior studies (axial image 91 of series 2) measuring up to 6.6 x 2.9 cm just below the lower pole of the right kidney. No hydroureteronephrosis. Urinary bladder is unremarkable in appearance. Mild nodular thickening of the adrenal glands bilaterally, stable compared to the prior study and nonspecific.  Stomach/Bowel: Stomach is nearly completely decompressed, but otherwise unremarkable in appearance. No pathologic dilatation of  small bowel or colon. Numerous colonic diverticula are noted, without surrounding inflammatory changes to suggest an acute diverticulitis at this time. Normal appendix. Vascular/Lymphatic: Atherosclerosis in the abdominal aorta and pelvic vasculature, without evidence of aneurysm or dissection in the abdominal or pelvic vasculature. Multiple prominent borderline enlarged retroperitoneal lymph nodes, nonspecific. No definite pathologically enlarged lymph nodes in the abdomen or pelvis. Reproductive: Prostate gland and seminal vesicles are unremarkable in appearance. Other: No significant volume of ascites.  No pneumoperitoneum. Musculoskeletal: There are no aggressive appearing lytic or blastic lesions noted in the visualized portions of the skeleton. IMPRESSION: 1. Circumferential mass-like thickening of the distal esophagus corresponding to reported esophageal neoplasm. Haziness in the paraesophageal fat could reflect early local invasion. No definitive evidence of metastatic disease confidently identified. 2. Chronic perinephric fluid collection adjacent to the right kidney appears larger than the prior examination, now extending caudally in the retroperitoneum. Whether this represents a chronic unresolved hematoma or abscess is uncertain. No internal gas to clearly indicate infection at this time. Further clinical evaluation is recommended. 3. Aortic atherosclerosis, in addition to three-vessel coronary artery disease. Status post median sternotomy for CABG including LIMA to the LAD. 4. Colonic diverticulosis without evidence of acute diverticulitis at this time. 5. Additional incidental findings, as above. Electronically Signed   By: Trudie Reed M.D.   On: 03/16/2023 08:00   DG ESOPHAGUS W DOUBLE CM (HD)  Result Date: 03/14/2023 CLINICAL DATA:  Provided history: Essential  hypertension. Dysphagia, unspecified type. Additional history: the patient reports dysphagia and vomiting for two weeks with 20 pound weight loss in this time. EXAM: ESOPHAGUS/BARIUM SWALLOW TECHNIQUE: A single contrast examination was performed using thin liquid barium. The exam was performed by Pattricia Boss, PA-C and was supervised and interpreted by Dr. Jackey Loge. FLUOROSCOPY: Radiation Exposure Index (as provided by the fluoroscopic device): 37.70 mGy Kerma COMPARISON:  CT abdomen/pelvis 08/13/2022.  Chest CT 09/10/2013. FINDINGS: Extensive filling defects throughout the mid and distal thoracic esophagus, likely reflecting retained food and pills and markedly limiting mucosal evaluation. Markedly delayed esophageal contrast transit, with only a small amount of contrast passing into the stomach during the examination. Known small hiatal hernia (better appreciated on the prior CT abdomen/pelvis of 08/13/2022). Findings discussed with Dr. Aram Beecham by telephone at 2:40 p.m. on 03/14/2023. The patient was transported directly from the fluoroscopy suite to the emergency department. IMPRESSION: 1. Extensive filling defects throughout the mid and distal thoracic esophagus, likely reflecting retained food and pills and markedly limiting mucosal evaluation. An obstructing distal esophageal mass or stricture cannot be excluded. Endoscopy should be considered. 2. Markedly delayed esophageal contrast transit, with only a small amount of contrast passing into the stomach during the examination. 3. Known small hiatal hernia. Electronically Signed   By: Jackey Loge D.O.   On: 03/14/2023 15:38     Assessment and Plan:   Preop cardiovascular evaluation Acceptable risk for PEG tube placement tomorrow, no further cardiac testing needed History of coronary disease, bypass surgery 2006 No recent stent placement or symptoms concerning for unstable angina Repeat EKG ordered Echocardiogram done this admission with  stable ejection fraction 50 to 55%, unchanged Appears euvolemic Blood pressure stable No additional workup needed prior to PEG tube placement tomorrow  2 Dysphagia due to Obstructing Esophageal mass S/p EGD , ood impaction which was removed. large occluding mass at GE junction.  Biopsy were taken, highly concerning for malignancy. Jejunostomy peg tube on Monday  3. Essential hypertension Blood pressure reasonable off medications Medications  on hold until PEG placed  4. PAD (peripheral artery disease) (HCC) S/p bilateral BKA. Not on any antiplatelet or statin at home  5. Atrial fibrillation, paroxysmal Clinical exam consistent with normal sinus rhythm, repeat EKG pending On Lovenox bridge, Eliquis on hold   6. Type 2 diabetes mellitus with peripheral neuropathy (HCC) on supplemental coverage with NovoLog.  7. Coronary artery disease, history of CABG 2006 Currently with no symptoms of angina. No further workup at this time. Continue current medication regimen. Updated echo with unchanged EF 50 to 55% Would recommend he establish with cardiology after discharge  For questions or updates, please contact [facility] Please consult [email or web address] for contact info under    Signed, Julien Nordmann, MD  03/17/2023 4:52 PM

## 2023-03-17 NOTE — H&P (View-Only) (Signed)
Patient ID: Alec Bender., male   DOB: 03-21-1956, 67 y.o.   MRN: [id number]     SURGICAL PROGRESS NOTE   Hospital Day(s): 3.   Interval History: Patient seen and examined, no acute events or new complaints overnight. Patient reports feeling well this morning.  Patient denies abdominal pain.  Patient denies any nausea or vomiting.  Patient denies chest pain.  Patient denies shortness of breath.  Patient was evaluated by medical oncology yesterday.  The plan is to proceed with neoadjuvant chemoradiation for esophageal mass.  Medical oncology recommended feeding tube due to obstructing esophageal mass.  Vital signs in last 24 hours: [min-max] current  Temp:  [97.7 F (36.5 C)-98.3 F (36.8 C)] 98.3 F (36.8 C) (11/16 1525) Pulse Rate:  [71-79] 79 (11/17 0436) Resp:  [17] 17 (11/16 1525) BP: (116-131)/(71-77) 131/77 (11/17 0436) SpO2:  [89 %-97 %] 89 % (11/17 0436)     Height: 6\' 4"  (193 cm) Weight: 135 kg BMI (Calculated): 36.24   Physical Exam:  Constitutional: alert, cooperative and no distress  Respiratory: breathing non-labored at rest  Cardiovascular: regular rate and sinus rhythm  Gastrointestinal: soft, non-tender, and non-distended  Labs:     Latest Ref Rng & Units 03/15/2023    4:02 AM 03/14/2023    4:18 PM 06/06/2022    2:59 AM  CBC  WBC 4.0 - 10.5 K/uL 6.7  7.3  9.8   Hemoglobin 13.0 - 17.0 g/dL 40.9  81.1  8.5   Hematocrit 39.0 - 52.0 % 36.1  42.4  27.6   Platelets 150 - 400 K/uL 160  170  310       Latest Ref Rng & Units 03/15/2023    4:02 AM 03/14/2023    4:18 PM 08/13/2022    2:12 PM  CMP  Glucose 70 - 99 mg/dL 914  782    BUN 8 - 23 mg/dL 27  25    Creatinine 9.56 - 1.24 mg/dL 2.13  0.86  5.78   Sodium 135 - 145 mmol/L 138  137    Potassium 3.5 - 5.1 mmol/L 3.7  4.4    Chloride 98 - 111 mmol/L 100  98    CO2 22 - 32 mmol/L 30  30    Calcium 8.9 - 10.3 mg/dL 8.5  8.9    Total Protein 6.5 - 8.1 g/dL  7.3    Total Bilirubin <1.2 mg/dL  0.4     Alkaline Phos 38 - 126 U/L  122    AST 15 - 41 U/L  15    ALT 0 - 44 U/L  19      Imaging studies: No new pertinent imaging studies   Assessment/Plan:  67 y.o. male with esophageal mass, complicated by pertinent comorbidities including coronary artery disease, CHF, COPD, asthma, severe peripheral vascular disease, type 2 diabetes, obstructive sleep apnea on CPAP.   -Patient with esophageal mass with obstruction.  Patient is strictly NPO.  He is basically not tolerating solid or liquid diet -This is highly suspicious for malignancy.  Despite the final results of the biopsy patient still needed an alternative route for nutrition -I discussed with patient about gastrostomy tube placement.  I discussed with patient the procedure.  I discussed the risk including bleeding, infection, leak from the stomach, dislodgment of the tube malfunctioning of the tube infection of the wound, among others.  The patient reported he understood and agreed to proceed with gastrostomy tube placement. -Will schedule patient for tomorrow.  Gae Gallop, MD

## 2023-03-17 NOTE — Progress Notes (Signed)
[facility]  - [place] [facility]   PATIENT NAME: [name]    MR#:  [id number]  DATE OF BIRTH:  October 11, 1955  SUBJECTIVE:  no family at bedside. Spoke with patient's wife on FaceTime and answered most of her questions. Patient denies any complaints. Wondering what time his procedure will be done tomorrow   VITALS:  Blood pressure 138/73, pulse 77, temperature 97.8 F (36.6 C), resp. rate 18, height 6\' 4"  (1.93 m), weight 135 kg, SpO2 90%.  PHYSICAL EXAMINATION:   GENERAL:  67 y.o.-year-old patient with no acute distress. Obese LUNGS: Normal breath sounds bilaterally, no wheezing CARDIOVASCULAR: S1, S2 normal. No murmur   ABDOMEN: Soft, nontender, nondistended. Bowel sounds present.  EXTREMITIES: are well healed amputation stump  NEUROLOGIC: nonfocal  patient is alert and awake  LABORATORY PANEL:  CBC Recent Labs  Lab 03/15/23 0402  WBC 6.7  HGB 11.4*  HCT 36.1*  PLT 160    Chemistries  Recent Labs  Lab 03/14/23 1618 03/15/23 0402  NA 137 138  K 4.4 3.7  CL 98 100  CO2 30 30  GLUCOSE 193* 188*  BUN 25* 27*  CREATININE 1.27* 1.14  CALCIUM 8.9 8.5*  AST 15  --   ALT 19  --   ALKPHOS 122  --   BILITOT 0.4  --    Cardiac Enzymes No results for input(s): "TROPONINI" in the last 168 hours. RADIOLOGY:  CT CHEST ABDOMEN PELVIS W CONTRAST  Result Date: 03/16/2023 CLINICAL DATA:  67 year old male with history of esophageal cancer. * Tracking Code: BO * EXAM: CT CHEST, ABDOMEN, AND PELVIS WITH CONTRAST TECHNIQUE: Multidetector CT imaging of the chest, abdomen and pelvis was performed following the standard protocol during bolus administration of intravenous contrast. RADIATION DOSE REDUCTION: This exam was performed according to the departmental dose-optimization program which includes automated exposure control, adjustment of the mA and/or kV according to patient size and/or use of iterative reconstruction technique. CONTRAST:  OMNIPAQUE  IOHEXOL 300 MG/ML  SOLN COMPARISON:  CT of the abdomen and pelvis 08/13/2022. Chest CTA 09/10/2013. FINDINGS: CT CHEST FINDINGS Cardiovascular: Heart size is normal. There is no significant pericardial fluid, thickening or pericardial calcification. Atherosclerotic calcifications are noted in the thoracic aorta as well as the left anterior descending, left circumflex and right coronary arteries. Status post median sternotomy for CABG including LIMA to the LAD. Mediastinum/Nodes: No pathologically enlarged mediastinal or hilar lymph nodes. Extensive thickening of the distal esophagus measuring up to 1.7 cm in thickness shortly before the gastroesophageal junction where there is also haziness in the paraesophageal fat of the middle mediastinum, concerning for potential esophageal neoplasm. No axillary lymphadenopathy. Lungs/Pleura: No acute consolidative airspace disease. No pleural effusions. No definite suspicious appearing pulmonary nodules or masses are noted. Scattered areas of mild scarring are noted in the lung bases bilaterally. Musculoskeletal: Median sternotomy wires. There are no aggressive appearing lytic or blastic lesions noted in the visualized portions of the skeleton. CT ABDOMEN PELVIS FINDINGS Hepatobiliary: No suspicious cystic or solid hepatic lesions. No intra or extrahepatic biliary ductal dilatation. Gallbladder is unremarkable in appearance. Pancreas: No pancreatic mass. No pancreatic ductal dilatation. No pancreatic or peripancreatic fluid collections or inflammatory changes. Spleen: Unremarkable. Adrenals/Urinary Tract: Exophytic 1.7 cm low-attenuation lesion in the posterior aspect of the interpolar region of the left kidney, compatible with a simple cyst. Centrally low-attenuation peripherally thick walled right-sided perinephric fluid collection (axial image 84 of series 2) measuring 5.0 x 3.5 cm, tracking caudally along the  right retroperitoneum anterior to the iliopsoas musculature,  likely a chronic and unresolved hematoma, although abscess is not entirely excluded. The inferior aspect of this appears larger than prior studies (axial image 91 of series 2) measuring up to 6.6 x 2.9 cm just below the lower pole of the right kidney. No hydroureteronephrosis. Urinary bladder is unremarkable in appearance. Mild nodular thickening of the adrenal glands bilaterally, stable compared to the prior study and nonspecific. Stomach/Bowel: Stomach is nearly completely decompressed, but otherwise unremarkable in appearance. No pathologic dilatation of small bowel or colon. Numerous colonic diverticula are noted, without surrounding inflammatory changes to suggest an acute diverticulitis at this time. Normal appendix. Vascular/Lymphatic: Atherosclerosis in the abdominal aorta and pelvic vasculature, without evidence of aneurysm or dissection in the abdominal or pelvic vasculature. Multiple prominent borderline enlarged retroperitoneal lymph nodes, nonspecific. No definite pathologically enlarged lymph nodes in the abdomen or pelvis. Reproductive: Prostate gland and seminal vesicles are unremarkable in appearance. Other: No significant volume of ascites.  No pneumoperitoneum. Musculoskeletal: There are no aggressive appearing lytic or blastic lesions noted in the visualized portions of the skeleton. IMPRESSION: 1. Circumferential mass-like thickening of the distal esophagus corresponding to reported esophageal neoplasm. Haziness in the paraesophageal fat could reflect early local invasion. No definitive evidence of metastatic disease confidently identified. 2. Chronic perinephric fluid collection adjacent to the right kidney appears larger than the prior examination, now extending caudally in the retroperitoneum. Whether this represents a chronic unresolved hematoma or abscess is uncertain. No internal gas to clearly indicate infection at this time. Further clinical evaluation is recommended. 3. Aortic  atherosclerosis, in addition to three-vessel coronary artery disease. Status post median sternotomy for CABG including LIMA to the LAD. 4. Colonic diverticulosis without evidence of acute diverticulitis at this time. 5. Additional incidental findings, as above. Electronically Signed   By: Trudie Reed M.D.   On: 03/16/2023 08:00    Assessment and Plan  [name]. is a 67 y.o. Caucasian male with medical history significant for asthma, COPD, CHF, coronary artery disease, depression, gout, type 2 diabetes mellitus, hypertension, dyslipidemia, PVD and OSA on CPAP, who presented to the emergency room with dysphagia which has been going on over the last several weeks including both solids and liquids   Outpatient barium swallow revealed the following: 1. Extensive filling defects throughout the mid and distal thoracic esophagus, likely reflecting retained food and pills and markedly limiting mucosal evaluation. An obstructing distal esophageal mass or stricture cannot be excluded. Endoscopy should be considered. 2. Markedly delayed esophageal contrast transit, with only a small amount of contrast passing into the stomach during the examination. 3. Known small hiatal hernia.  Dysphagia due to Obstructing Esophageal mass S/p EGD which shows some food impaction which was removed.  Also found to have a large occluding mass at GE junction.  Biopsy were taken, highly concerning for malignancy. GI is concerned that patient will need long-term TPN as he will not be a candidate for PEG tube placement due to occlusion of esophagus. -Oncology Dr Durward Parcel-- patient not a surgical candidate. -- Gen. surgery Dr. Hazle Quant to place Jejunostomy peg tube on Monday   AKI (acute kidney injury) (HCC) -due to diminished p.o. intake. Creatinine improved to 1.14 with IV fluid. -Continue with gentle IV fluid as patient might not be able to take p.o.   Hypothyroidism - holding po Synthroid.    Essential hypertension - holding PO meds till peg placed  PAD (peripheral artery disease) (HCC) S/p bilateral  BKA. Not on any antiplatelet or statin at home   Atrial fibrillation, chronic (HCC) - Cardizem CD and amiodarone to be resumed after peg placement -- will switch to Lovenox 1 mg per kilogram bridge till peg tube is placed -- eliquis on hold   Type 2 diabetes mellitus with peripheral neuropathy (HCC) - Patient will be placed on supplemental coverage with NovoLog. - continue basal coverage. - continue Neurontin.   BPH (benign prostatic hyperplasia) - continue Flomax.   Coronary artery disease - patient will be seen by cardiology Dr. Mariah Milling for preop clearance.  Procedures: EGD Family communication : none today. Patient will discuss with wife Consults : oncology, cardiology, general surgery CODE STATUS: full DVT Prophylaxis : Lovenox Level of care: Med-Surg Status is: Inpatient Remains inpatient appropriate because: new diagnosis of possible esophageal mass, peg tube placement to be done on Monday    TOTAL TIME TAKING CARE OF THIS PATIENT: 40 minutes.  >50% time spent on counselling and coordination of care  Note: This dictation was prepared with Dragon dictation along with smaller phrase technology. Any transcriptional errors that result from this process are unintentional.  Enedina Finner M.D    [facility]   CC: Primary care physician; [name], MD

## 2023-03-18 ENCOUNTER — Inpatient Hospital Stay: Payer: Medicare Other | Admitting: Anesthesiology

## 2023-03-18 ENCOUNTER — Encounter
Admission: EM | Disposition: A | Discharge status: Home / Self Care | Payer: Self-pay | Source: Home / Self Care | Attending: Internal Medicine

## 2023-03-18 ENCOUNTER — Other Ambulatory Visit: Payer: Self-pay

## 2023-03-18 ENCOUNTER — Inpatient Hospital Stay: Payer: Medicare Other

## 2023-03-18 ENCOUNTER — Encounter: Payer: Self-pay | Admitting: Gastroenterology

## 2023-03-18 ENCOUNTER — Ambulatory Visit: Payer: Medicare Other

## 2023-03-18 DIAGNOSIS — K2289 Other specified disease of esophagus: Secondary | ICD-10-CM | POA: Diagnosis not present

## 2023-03-18 HISTORY — PX: PORTACATH PLACEMENT: SHX2246

## 2023-03-18 HISTORY — PX: GASTROSTOMY: SHX5249

## 2023-03-18 LAB — GLUCOSE, CAPILLARY
Glucose-Capillary: 115 mg/dL — ABNORMAL HIGH (ref 70–99)
Glucose-Capillary: 120 mg/dL — ABNORMAL HIGH (ref 70–99)
Glucose-Capillary: 130 mg/dL — ABNORMAL HIGH (ref 70–99)
Glucose-Capillary: 140 mg/dL — ABNORMAL HIGH (ref 70–99)
Glucose-Capillary: 84 mg/dL (ref 70–99)
Glucose-Capillary: 89 mg/dL (ref 70–99)
Glucose-Capillary: 90 mg/dL (ref 70–99)
Glucose-Capillary: 96 mg/dL (ref 70–99)

## 2023-03-18 LAB — SURGICAL PATHOLOGY

## 2023-03-18 SURGERY — INSERTION OF GASTROSTOMY TUBE
Anesthesia: General | Site: Chest

## 2023-03-18 MED ORDER — OXYCODONE HCL 5 MG PO TABS: 5.0000 mg | ORAL_TABLET | Freq: Once | ORAL | Status: DC | PRN

## 2023-03-18 MED ORDER — DEXTROSE 5 % IV SOLN
INTRAVENOUS | Status: DC | PRN
  Administered 2023-03-18: 3 g via INTRAVENOUS

## 2023-03-18 MED ORDER — ENOXAPARIN SODIUM 150 MG/ML IJ SOSY
125.0000 mg | PREFILLED_SYRINGE | Freq: Once | INTRAMUSCULAR | Status: AC
  Administered 2023-03-18: 125 mg via SUBCUTANEOUS
  Filled 2023-03-18: qty 0.84

## 2023-03-18 MED ORDER — SODIUM CHLORIDE 0.9 % IV SOLN
INTRAVENOUS | Status: DC | PRN
  Administered 2023-03-18: 9 mL via INTRAMUSCULAR

## 2023-03-18 MED ORDER — SODIUM CHLORIDE (PF) 0.9 % IJ SOLN
INTRAMUSCULAR | Status: AC
Start: 2023-03-18 — End: ?
  Filled 2023-03-18: qty 50

## 2023-03-18 MED ORDER — PROPOFOL 500 MG/50ML IV EMUL
INTRAVENOUS | Status: DC | PRN
  Administered 2023-03-18: 100 ug/kg/min via INTRAVENOUS

## 2023-03-18 MED ORDER — DEXTROSE 50 % IV SOLN
12.5000 g | Freq: Once | INTRAVENOUS | Status: AC
  Administered 2023-03-18: 12.5 g via INTRAVENOUS

## 2023-03-18 MED ORDER — MIDAZOLAM HCL 2 MG/2ML IJ SOLN
INTRAMUSCULAR | Status: DC | PRN
  Administered 2023-03-18 (×2): 1 mg via INTRAVENOUS

## 2023-03-18 MED ORDER — THIAMINE MONONITRATE 100 MG PO TABS
100.0000 mg | ORAL_TABLET | Freq: Every day | ORAL | Status: DC
  Administered 2023-03-19 – 2023-03-22 (×4): 100 mg
  Filled 2023-03-18 (×5): qty 1

## 2023-03-18 MED ORDER — BUPIVACAINE-EPINEPHRINE (PF) 0.25% -1:200000 IJ SOLN
INTRAMUSCULAR | Status: AC
  Filled 2023-03-18: qty 30

## 2023-03-18 MED ORDER — OSMOLITE 1.5 CAL PO LIQD
1000.0000 mL | ORAL | Status: DC
  Administered 2023-03-18 – 2023-03-20 (×3): 1000 mL
  Filled 2023-03-18: qty 1000

## 2023-03-18 MED ORDER — ACETAMINOPHEN 10 MG/ML IV SOLN
INTRAVENOUS | Status: DC | PRN
  Administered 2023-03-18: 1000 mg via INTRAVENOUS

## 2023-03-18 MED ORDER — LIDOCAINE HCL 1 % IJ SOLN
INTRAMUSCULAR | Status: DC | PRN
  Administered 2023-03-18: 50 mL

## 2023-03-18 MED ORDER — ACETAMINOPHEN 10 MG/ML IV SOLN: 1000.0000 mg | Freq: Once | INTRAVENOUS | Status: DC | PRN

## 2023-03-18 MED ORDER — OXYCODONE HCL 5 MG/5ML PO SOLN: 5.0000 mg | Freq: Once | ORAL | Status: DC | PRN

## 2023-03-18 MED ORDER — PROPOFOL 10 MG/ML IV BOLUS
INTRAVENOUS | Status: DC | PRN
  Administered 2023-03-18: 100 mg via INTRAVENOUS

## 2023-03-18 MED ORDER — FENTANYL CITRATE (PF) 100 MCG/2ML IJ SOLN
INTRAMUSCULAR | Status: AC
  Filled 2023-03-18: qty 2

## 2023-03-18 MED ORDER — DEXTROSE 50 % IV SOLN
INTRAVENOUS | Status: AC
  Filled 2023-03-18: qty 50

## 2023-03-18 MED ORDER — LACTATED RINGERS IV SOLN: INTRAVENOUS | Status: DC

## 2023-03-18 MED ORDER — LORAZEPAM 2 MG/ML PO CONC
1.0000 mg | Freq: Once | ORAL | Status: AC
  Administered 2023-03-18: 1 mg
  Filled 2023-03-18: qty 0.5

## 2023-03-18 MED ORDER — ACETAMINOPHEN 10 MG/ML IV SOLN
INTRAVENOUS | Status: AC
  Filled 2023-03-18: qty 100

## 2023-03-18 MED ORDER — CEFAZOLIN SODIUM 1 G IJ SOLR
INTRAMUSCULAR | Status: AC
  Filled 2023-03-18: qty 20

## 2023-03-18 MED ORDER — OSMOLITE 1.2 CAL PO LIQD
1000.0000 mL | ORAL | Status: DC
  Filled 2023-03-18: qty 1000

## 2023-03-18 MED ORDER — PHENYLEPHRINE HCL-NACL 20-0.9 MG/250ML-% IV SOLN
INTRAVENOUS | Status: DC | PRN
  Administered 2023-03-18 (×5): 160 ug via INTRAVENOUS

## 2023-03-18 MED ORDER — MIDAZOLAM HCL 2 MG/2ML IJ SOLN
INTRAMUSCULAR | Status: AC
  Filled 2023-03-18: qty 2

## 2023-03-18 MED ORDER — CEFAZOLIN SODIUM 1 G IJ SOLR
INTRAMUSCULAR | Status: AC
  Filled 2023-03-18: qty 10

## 2023-03-18 MED ORDER — FENTANYL CITRATE (PF) 100 MCG/2ML IJ SOLN
INTRAMUSCULAR | Status: DC | PRN
  Administered 2023-03-18 (×3): 25 ug via INTRAVENOUS
  Administered 2023-03-18 (×2): 50 ug via INTRAVENOUS
  Administered 2023-03-18: 25 ug via INTRAVENOUS

## 2023-03-18 MED ORDER — PHENYLEPHRINE 80 MCG/ML (10ML) SYRINGE FOR IV PUSH (FOR BLOOD PRESSURE SUPPORT)
PREFILLED_SYRINGE | INTRAVENOUS | Status: AC
  Filled 2023-03-18: qty 10

## 2023-03-18 MED ORDER — SODIUM CHLORIDE 0.9 % IV SOLN: INTRAVENOUS | Status: DC | PRN

## 2023-03-18 MED ORDER — ONDANSETRON HCL 4 MG/2ML IJ SOLN: 4.0000 mg | Freq: Once | INTRAMUSCULAR | Status: DC | PRN

## 2023-03-18 MED ORDER — FENTANYL CITRATE (PF) 100 MCG/2ML IJ SOLN
25.0000 ug | INTRAMUSCULAR | Status: DC | PRN
Start: 2023-03-18 — End: 2023-03-18

## 2023-03-18 MED ORDER — APIXABAN 5 MG PO TABS
5.0000 mg | ORAL_TABLET | Freq: Two times a day (BID) | ORAL | Status: DC
  Administered 2023-03-19: 5 mg via ORAL
  Filled 2023-03-18: qty 1

## 2023-03-18 MED ORDER — HEPARIN SODIUM (PORCINE) 5000 UNIT/ML IJ SOLN
INTRAMUSCULAR | Status: AC
  Filled 2023-03-18: qty 1

## 2023-03-18 MED ORDER — LIDOCAINE HCL (PF) 1 % IJ SOLN
INTRAMUSCULAR | Status: AC
  Filled 2023-03-18: qty 30

## 2023-03-18 MED ORDER — PROPOFOL 1000 MG/100ML IV EMUL
INTRAVENOUS | Status: AC
  Filled 2023-03-18: qty 100

## 2023-03-18 SURGICAL SUPPLY — 66 items
5 PAIRS OF YELLOW SUTURE CLAMP (MISCELLANEOUS) ×2
BAG DECANTER FOR FLEXI CONT (MISCELLANEOUS) ×2 IMPLANT
BLADE SURG 15 STRL LF DISP TIS (BLADE) ×2 IMPLANT
BLADE SURG 15 STRL SS (BLADE) ×2
BLADE SURG SZ11 CARB STEEL (BLADE) ×2 IMPLANT
CHLORAPREP W/TINT 26 (MISCELLANEOUS) ×2 IMPLANT
CLAMP SUTURE YELLOW 5 PAIRS (MISCELLANEOUS) ×2 IMPLANT
DERMABOND ADVANCED .7 DNX12 (GAUZE/BANDAGES/DRESSINGS) ×2 IMPLANT
DRAPE C-ARM XRAY 36X54 (DRAPES) ×2 IMPLANT
DRAPE LAPAROTOMY 100X77 ABD (DRAPES) ×2 IMPLANT
ELECT CAUTERY BLADE 6.4 (BLADE) IMPLANT
ELECT REM PT RETURN 9FT ADLT (ELECTROSURGICAL) ×2
ELECTRODE REM PT RTRN 9FT ADLT (ELECTROSURGICAL) ×2 IMPLANT
G-TUBE MIC 18FR ENFIT ADLT (TUBING) IMPLANT
G-TUBE MIC 22FR ENFIT ADLT (TUBING) IMPLANT
G-TUBE MIC ADLT 16FR ENFIT (TUBING) IMPLANT
GAUZE 4X4 16PLY ~~LOC~~+RFID DBL (SPONGE) IMPLANT
GAUZE SPONGE 4X4 12PLY STRL (GAUZE/BANDAGES/DRESSINGS) ×2 IMPLANT
GLOVE BIO SURGEON STRL SZ 6.5 (GLOVE) ×2 IMPLANT
GLOVE BIOGEL PI IND STRL 6.5 (GLOVE) ×2 IMPLANT
GLOVE BIOGEL PI IND STRL 7.0 (GLOVE) ×2 IMPLANT
GLOVE SURG SYN 6.5 ES PF (GLOVE) ×6 IMPLANT
GLOVE SURG SYN 6.5 PF PI (GLOVE) ×2 IMPLANT
GOWN STRL REUS W/ TWL LRG LVL3 (GOWN DISPOSABLE) ×6 IMPLANT
GOWN STRL REUS W/TWL LRG LVL3 (GOWN DISPOSABLE) ×6
IV NS 500ML (IV SOLUTION) ×2
IV NS 500ML BAXH (IV SOLUTION) ×2 IMPLANT
KIT PORT INFUSION SMART 8FR (Port) ×2 IMPLANT
KIT TURNOVER KIT A (KITS) ×2 IMPLANT
LABEL OR SOLS (LABEL) ×2 IMPLANT
MANIFOLD NEPTUNE II (INSTRUMENTS) ×2 IMPLANT
NDL FILTER BLUNT 18X1 1/2 (NEEDLE) ×2 IMPLANT
NDL HYPO 22X1.5 SAFETY MO (MISCELLANEOUS) ×2 IMPLANT
NEEDLE FILTER BLUNT 18X1 1/2 (NEEDLE) ×2 IMPLANT
NEEDLE HYPO 22X1.5 SAFETY MO (MISCELLANEOUS) ×2 IMPLANT
NS IRRIG 1000ML POUR BTL (IV SOLUTION) ×2 IMPLANT
PACK BASIN MAJOR ARMC (MISCELLANEOUS) ×2 IMPLANT
PACK PORT-A-CATH (MISCELLANEOUS) ×2 IMPLANT
SPONGE DRAIN TRACH 4X4 STRL 2S (GAUZE/BANDAGES/DRESSINGS) ×2 IMPLANT
SPONGE T-LAP 18X18 ~~LOC~~+RFID (SPONGE) ×4 IMPLANT
SUT ETHILON 2 0 FS 18 (SUTURE) IMPLANT
SUT MNCRL 4-0 (SUTURE) ×2
SUT MNCRL 4-0 27XMFL (SUTURE) ×2
SUT MNCRL AB 4-0 PS2 18 (SUTURE) ×2 IMPLANT
SUT PDS AB 1 CT1 36 (SUTURE) ×4 IMPLANT
SUT PROLENE 2 0 FS (SUTURE) ×2 IMPLANT
SUT PROLENE 2 0 SH DA (SUTURE) IMPLANT
SUT SILK 2 0 SH (SUTURE) IMPLANT
SUT SILK 3 0 SH CR/8 (SUTURE) IMPLANT
SUT VIC AB 2-0 SH 27 (SUTURE) ×2
SUT VIC AB 2-0 SH 27XBRD (SUTURE) ×2 IMPLANT
SUT VIC AB 3-0 SH 27 (SUTURE) ×2
SUT VIC AB 3-0 SH 27X BRD (SUTURE) ×2 IMPLANT
SUTURE MNCRL 4-0 27XMF (SUTURE) IMPLANT
SYR 10ML LL (SYRINGE) ×4 IMPLANT
SYR 20ML LL LF (SYRINGE) ×2 IMPLANT
SYR 3ML LL SCALE MARK (SYRINGE) ×2 IMPLANT
TAG SUTURE CLAMP YLW 5PR (MISCELLANEOUS) ×2
TRAP FLUID SMOKE EVACUATOR (MISCELLANEOUS) ×2 IMPLANT
TUBE GASTRO 14FR ENFIT (TUBING)
TUBE GASTRO 16FR ENFIT (TUBING)
TUBE GASTRO 18FR ENFIT (TUBING) IMPLANT
TUBE GASTRO 22FR ENFIT (TUBING) ×2 IMPLANT
TUBE GSTRM 1 16FR INTNL ENFIT (TUBING) IMPLANT
TUBE GSTRM 10.5X14FR YPRT (TUBING) IMPLANT
WATER STERILE IRR 500ML POUR (IV SOLUTION) ×2 IMPLANT

## 2023-03-18 NOTE — Op Note (Signed)
SURGICAL PROCEDURE REPORT  DATE OF PROCEDURE: 03/18/2023   SURGEON: Dr. Hazle Quant   ANESTHESIA: Local with light IV sedation   PRE-OPERATIVE DIAGNOSIS: Esophageal mass   POST-OPERATIVE DIAGNOSIS: Same  PROCEDURE(S): (cpt: 36561) 1.) Percutaneous access of Right internal jugular vein under ultrasound guidance 2.) Insertion of tunneled Right internal jugular central venous catheter with subcutaneous port  INTRAOPERATIVE FINDINGS: Patent easily compressible Right internal jugular vein with appropriate respiratory variations and well-secured tunneled central venous catheter with subcutaneous port at completion of the procedure  ESTIMATED BLOOD LOSS: Minimal (<20 mL)   SPECIMENS: None   IMPLANTS: 7F tunneled Bard PowerPort central venous catheter with subcutaneous port  DRAINS: None   COMPLICATIONS: None apparent   CONDITION AT COMPLETION: Hemodynamically stable, awake   DISPOSITION: PACU   INDICATION(S) FOR PROCEDURE:  Patient is a 67 y.o. male who presented with esophageal cancer requiring durable central venous access for chemotherapy. All risks, benefits, and alternatives to above elective procedures were discussed with the patient, who elected to proceed, and informed consent was accordingly obtained at that time.  DETAILS OF PROCEDURE:  Patient was brought to the operative suite and appropriately identified. In Trendelenburg position, Right IJ venous access site was prepped and draped in the usual sterile fashion, and following a brief timeout, percutaneous Right IJ venous access was obtained under ultrasound guidance using Seldinger technique, by which local anesthetic was injected over the Right IJ vein, and access needle was inserted under direct ultrasound visualization into the Right IJ vein, through which soft guidewire was advanced, over which access needle was withdrawn. Guidewire was secured, attention was directed to injection of local anesthetic along the planned  tunnel site, 2-3 cm transverse Right chest incision was made and confirmed to accommodate the subcutaneous port, and flushed catheter was tunneled retrograde from the port site over the Right chest to the Right IJ access site with the attached port well-secured to the catheter and within the subcutaneous pocket. Insertion sheath was advanced over the guidewire, which was withdrawn along with the insertion sheath dilator. The catheter was introduced through the sheath and left on the [place] junction under fluoro guidance and catheter cut to desire lenght. Catheter connected to port and fixed to the pocket on two side to avoid twisting. Port was confirmed to withdraw blood and flush easily, after which concentrated heparin was instilled into the port and catheter. Dermis at the subcutaneous pocket was re-approximated using buried interrupted 3-0 Vicryl suture, and 4-0 Monocryl suture was used to re-approximate skin at the insertion and subcutaneous port sites in running subcuticular fashion for the subcutaneous port and buried interrupted fashion for the insertion site. Skin was cleaned, dried, and sterile skin glue was applied. Patient was then safely transferred to PACU for a chest x-ray. Ultrasound images are available on paper chart and Fluoroscopy guidance images are available in Epic.

## 2023-03-18 NOTE — Progress Notes (Addendum)
Nutrition Follow-up  DOCUMENTATION CODES:   Obesity unspecified  INTERVENTION:   -Once feeding tube is cleared for use, recommend:   Initiate Osmolite 1.5 @ 20 ml/hr and increase by 10 ml every 8 hours to goal rate of 60 ml/hr.   60 ml Prosource TF BID  If no IVFs, 160 ml free water flush every 4 hours  Tube feeding regimen provides 2320 kcal (100% of needs), 130 grams of protein, and 1097 ml of H2O.  Total free water: 2057 ml daily  -100 mg thiamine daily x 5 days due to high refeeding risk -Monitor Mg, K, and Phos and replete as needed secondary to high refeeding risk  -Once closer to discharge, consider transition to bolus feedings:   474 ml (2 cartons) Osmolite 1.5  70 ml free water flush before and after each feeding administration  Tube feeding regimen provides 2480 kcal (100% of needs), 119 grams of protein, and 1448 ml of H2O. Total free water: 2008 ml daily  NUTRITION DIAGNOSIS:   Inadequate oral intake related to inability to eat as evidenced by NPO status.  Ongoing  GOAL:   Patient will meet greater than or equal to 90% of their needs  Progressing   MONITOR:   TF tolerance  REASON FOR ASSESSMENT:   Malnutrition Screening Tool    ASSESSMENT:   Pt with medical history significant for asthma, COPD, CHF, coronary artery disease, depression, gout, type 2 diabetes mellitus, hypertension, dyslipidemia, PVD and OSA on CPAP, who presented with acute onset of dysphagia which has been going on over the last several weeks including both solids and liquids however he was able to drink small sips of water and has been tolerating his secretions.  11/15- s/p EGD- food removal in lower third of esophagus, malignant appearing esophageal stenosis, completely obstructing, esophageal tumor in lowe third of esophagus (biopsied)  118- s/p Stamm gastrostomy   Reviewed I/O's: -175 ml x 24 hours and -673 ml since admission  UOP: -175 ml x 24 hours   Per oncology notes,  pt is not a candidate for surgical resection and plan for chemoradiation. Biopsies still pending.   Spoke with pt at bedside, who reports feeling better today. He confirms plan for port-cath placement and feeding tube placement. Pt reports he feels a little down about all of this, as he was supposed to get his prothesis this week, but is hopeful that these interventions will help him to continue to progress overall. RD provided emotional support; pt grateful that interventions are in place to help support him. Discussed how feeding tube will continue to help him meet his goals with mobility and with cancer treatments.   Pt reports decreased oral intake over the past 3 weeks due to difficulty swallowing. Per pt, he was only able to keep down chicken broth over the past 2 weeks.   Reviewed wt hx; no wt loss noted over the past 10 months. Per pt, he estimates that he has lost about 30# in the past few weeks due to limited oral intake. Pt also concerned about muscle loss in thigh area.   Case discussed with general surgery and MD; ok to start feedings today.   Medications reviewed and include vitamin C, vitamin D3, cardizem, ferrous sulfate, neurontin, and zinc sulfate.   Labs reviewed: CBGS: 84-103 (inpatient orders for glycemic control are 0-15 units insulin aspart every 4 hours).    NUTRITION - FOCUSED PHYSICAL EXAM:  Flowsheet Row Most Recent Value  Orbital Region No depletion  Upper Arm Region Mild depletion  Thoracic and Lumbar Region No depletion  Buccal Region No depletion  Temple Region Mild depletion  Clavicle Bone Region No depletion  Clavicle and Acromion Bone Region No depletion  Scapular Bone Region No depletion  Dorsal Hand Mild depletion  Patellar Region No depletion  Anterior Thigh Region No depletion  Posterior Calf Region Unable to assess  Edema (RD Assessment) Mild  Hair Reviewed  Eyes Reviewed  Mouth Reviewed  Skin Reviewed  Nails Reviewed       Diet Order:    Diet Order             Diet NPO time specified  Diet effective now                   EDUCATION NEEDS:   Education needs have been addressed  Skin:  Skin Assessment: Reviewed RN Assessment  Last BM:  03/14/23  Height:   Ht Readings from Last 1 Encounters:  03/18/23 6\' 4"  (1.93 m)    Weight:   Wt Readings from Last 1 Encounters:  03/18/23 124.4 kg    Ideal Body Weight:  75.2 kg (adjusted for bilateral BKAs)  BMI:  Body mass index is 33.38 kg/m.  Estimated Nutritional Needs:   Kcal:  8295-6213  Protein:  115-130 grams  Fluid:  > 2 L    Levada Schilling, RD, LDN, CDCES Registered Dietitian III Certified Diabetes Care and Education Specialist Please refer to [facility] for RD and/or RD on-call/weekend/after hours pager

## 2023-03-18 NOTE — Interval H&P Note (Signed)
History and Physical Interval Note:  03/18/2023 10:30 AM  [name].  has presented today for surgery, with the diagnosis of mass.  The various methods of treatment have been discussed with the patient and family. After consideration of risks, benefits and other options for treatment, the patient has consented to  Procedure(s): INSERTION OF GASTROSTOMY TUBE ([name]) INSERTION PORT-A-CATH ([name]) as a surgical intervention.  The patient's history has been reviewed, patient examined, no change in status, stable for surgery.  I have reviewed the patient's chart and labs.  Questions were answered to the patient's satisfaction.     Carolan Shiver

## 2023-03-18 NOTE — Anesthesia Procedure Notes (Signed)
Procedure Name: LMA Insertion Date/Time: 03/18/2023 11:11 AM  Performed by: Reece Agar, CRNAPre-anesthesia Checklist: Patient identified, Emergency Drugs available, Suction available and Patient being monitored Patient Re-evaluated:Patient Re-evaluated prior to induction Oxygen Delivery Method: Circle system utilized Preoxygenation: Pre-oxygenation with 100% oxygen Induction Type: IV induction Ventilation: Mask ventilation without difficulty LMA: LMA inserted LMA Size: 5.0 Number of attempts: 1 Placement Confirmation: positive ETCO2 and breath sounds checked- equal and bilateral Tube secured with: Tape Dental Injury: Teeth and Oropharynx as per pre-operative assessment

## 2023-03-18 NOTE — Progress Notes (Signed)
[facility]  - [place] [facility]   PATIENT NAME: [name]    MR#:  [id number]  DATE OF BIRTH:  01/03/1956  SUBJECTIVE:  no family at bedside. Patient denies any complaints. Awaiting his procedures  VITALS:  Blood pressure 128/74, pulse 85, temperature 98 F (36.7 C), resp. rate 16, height 6\' 4"  (1.93 m), weight 124.4 kg, SpO2 94%.  PHYSICAL EXAMINATION:   GENERAL:  67 y.o.-year-old patient with no acute distress. Obese LUNGS: Normal breath sounds bilaterally, no wheezing CARDIOVASCULAR: S1, S2 normal. No murmur   ABDOMEN: Soft, nontender, nondistended. Bowel sounds present.  EXTREMITIES: are well healed amputation stump  NEUROLOGIC: nonfocal  patient is alert and awake  LABORATORY PANEL:  CBC Recent Labs  Lab 03/15/23 0402  WBC 6.7  HGB 11.4*  HCT 36.1*  PLT 160    Chemistries  Recent Labs  Lab 03/14/23 1618 03/15/23 0402  NA 137 138  K 4.4 3.7  CL 98 100  CO2 30 30  GLUCOSE 193* 188*  BUN 25* 27*  CREATININE 1.27* 1.14  CALCIUM 8.9 8.5*  AST 15  --   ALT 19  --   ALKPHOS 122  --   BILITOT 0.4  --    Cardiac Enzymes No results for input(s): "TROPONINI" in the last 168 hours. RADIOLOGY:  DG C-Arm 1-60 Min-No Report  Result Date: 03/18/2023 Fluoroscopy was utilized by the requesting physician.  No radiographic interpretation.   ECHOCARDIOGRAM COMPLETE  Result Date: 03/17/2023    ECHOCARDIOGRAM REPORT   Patient Name:   Stephaun Borders. Date of Exam: 03/17/2023 Medical Rec #:  [id number]             Height:       76.0 in Accession #:    [id number]            Weight:       297.6 lb Date of Birth:  [birth year]            BSA:          2.623 m Patient Age:    67 years              BP:           138/73 mmHg Patient Gender: M                     HR:           76 bpm. Exam Location:  [facility] Procedure: 2D Echo and Strain Analysis Indications:     CAD Native Vessel I25.10  History:         Patient has prior history of Echocardiogram  examinations, most                  recent 10/05/2021.  Sonographer:     Overton Mam RDCS, FASE Referring Phys:  [id number] Antonieta Iba Diagnosing Phys: Julien Nordmann MD  Sonographer Comments: Global longitudinal strain was attempted. IMPRESSIONS  1. Left ventricular ejection fraction, by estimation, is 55 %. The left ventricle has normal function. The left ventricle demonstrates regional wall motion abnormalities (mild hypokinesis of the basal inferior wall). There is mild left ventricular hypertrophy. Left ventricular diastolic parameters are consistent with Grade I diastolic dysfunction (impaired relaxation). The average left ventricular global longitudinal strain is -12.0 %.  2. Right ventricular systolic function is normal. The right ventricular size is normal. Tricuspid regurgitation signal is inadequate for assessing PA pressure.  3. The mitral  valve is normal in structure. No evidence of mitral valve regurgitation. No evidence of mitral stenosis.  4. The aortic valve is tricuspid. There is mild calcification of the aortic valve. Aortic valve regurgitation is not visualized. Aortic valve sclerosis/calcification is present, without any evidence of aortic stenosis.  5. The inferior vena cava is normal in size with greater than 50% respiratory variability, suggesting right atrial pressure of 3 mmHg. FINDINGS  Left Ventricle: Left ventricular ejection fraction, by estimation, is 55 %. Left ventricular ejection fraction by PLAX is 57 %. The left ventricle has normal function. The left ventricle demonstrates regional wall motion abnormalities. The average left ventricular global longitudinal strain is -12.0 %. The left ventricular internal cavity size was normal in size. There is mild left ventricular hypertrophy. Left ventricular diastolic parameters are consistent with Grade I diastolic dysfunction (impaired  relaxation). Right Ventricle: The right ventricular size is normal. No increase in right ventricular  wall thickness. Right ventricular systolic function is normal. Tricuspid regurgitation signal is inadequate for assessing PA pressure. Left Atrium: Left atrial size was normal in size. Right Atrium: Right atrial size was normal in size. Pericardium: There is no evidence of pericardial effusion. Mitral Valve: The mitral valve is normal in structure. No evidence of mitral valve regurgitation. No evidence of mitral valve stenosis. Tricuspid Valve: The tricuspid valve is normal in structure. Tricuspid valve regurgitation is mild . No evidence of tricuspid stenosis. Aortic Valve: The aortic valve is tricuspid. There is mild calcification of the aortic valve. Aortic valve regurgitation is not visualized. Aortic valve sclerosis/calcification is present, without any evidence of aortic stenosis. Aortic valve peak gradient measures 11.3 mmHg. Pulmonic Valve: The pulmonic valve was normal in structure. Pulmonic valve regurgitation is not visualized. No evidence of pulmonic stenosis. Aorta: The aortic root is normal in size and structure. Venous: The inferior vena cava is normal in size with greater than 50% respiratory variability, suggesting right atrial pressure of 3 mmHg. IAS/Shunts: No atrial level shunt detected by color flow Doppler.  LEFT VENTRICLE PLAX 2D LV EF:         Left            Diastology                ventricular     LV e' medial:    6.64 cm/s                ejection        LV E/e' medial:  11.2                fraction by     LV e' lateral:   9.79 cm/s                PLAX is 57      LV E/e' lateral: 7.6                %. LVIDd:         5.30 cm         2D LVIDs:         3.70 cm         Longitudinal LV PW:         1.40 cm         Strain LV IVS:        1.40 cm         2D Strain GLS  -12.0 % LVOT diam:     2.35 cm  Avg: LV SV:         83 LV SV Index:   32 LVOT Area:     4.34 cm  RIGHT VENTRICLE RV Basal diam:  3.80 cm RV S prime:     9.57 cm/s TAPSE (M-mode): 1.8 cm LEFT ATRIUM             Index         RIGHT ATRIUM           Index LA diam:        3.80 cm 1.45 cm/m   RA Area:     19.30 cm LA Vol (A2C):   64.8 ml 24.71 ml/m  RA Volume:   56.30 ml  21.46 ml/m LA Vol (A4C):   46.0 ml 17.54 ml/m LA Biplane Vol: 59.6 ml 22.72 ml/m  AORTIC VALVE                 PULMONIC VALVE AV Area (Vmax): 2.27 cm     PV Vmax:        0.87 m/s AV Vmax:        168.00 cm/s  PV Peak grad:   3.0 mmHg AV Peak Grad:   11.3 mmHg    RVOT Peak grad: 4 mmHg LVOT Vmax:      87.80 cm/s LVOT Vmean:     58.600 cm/s LVOT VTI:       0.192 m  AORTA Ao Root diam: 3.70 cm Ao Asc diam:  3.10 cm MITRAL VALVE MV Area (PHT): 3.54 cm    SHUNTS MV Decel Time: 214 msec    Systemic VTI:  0.19 m MV E velocity: 74.40 cm/s  Systemic Diam: 2.35 cm MV A velocity: 97.50 cm/s MV E/A ratio:  0.76 Julien Nordmann MD Electronically signed by Julien Nordmann MD Signature Date/Time: 03/17/2023/2:49:50 PM    Final     Assessment and Plan  [name] is a 67 y.o. Caucasian male with medical history significant for asthma, COPD, CHF, coronary artery disease, depression, gout, type 2 diabetes mellitus, hypertension, dyslipidemia, PVD and OSA on CPAP, who presented to the emergency room with dysphagia which has been going on over the last several weeks including both solids and liquids   Outpatient barium swallow revealed the following: 1. Extensive filling defects throughout the mid and distal thoracic esophagus, likely reflecting retained food and pills and markedly limiting mucosal evaluation. An obstructing distal esophageal mass or stricture cannot be excluded. Endoscopy should be considered. 2. Markedly delayed esophageal contrast transit, with only a small amount of contrast passing into the stomach during the examination. 3. Known small hiatal hernia.  Dysphagia due to Obstructing Esophageal mass S/p EGD which shows some food impaction which was removed.  Also found to have a large occluding mass at GE junction.  Biopsy were taken, highly  concerning for malignancy. GI is concerned that patient will need long-term TPN as he will not be a candidate for PEG tube placement due to occlusion of esophagus. -Oncology Dr Durward Parcel-- patient not a surgical candidate. -- Gen. surgery Dr. Hazle Quant to place Jejunostomy peg tube and port placement on Monday -- seen by Dr. Mariah Milling for preop -- will have dietitian see patient for tube feeding   AKI (acute kidney injury) (HCC) -due to diminished p.o. intake. Creatinine improved to 1.14 with IV fluid. -Continue with gentle IV fluid as patient might not be able to take p.o.   Hypothyroidism - holding po Synthroid.   Essential hypertension -  holding PO meds till peg placed  PAD (peripheral artery disease) (HCC) S/p bilateral BKA. Not on any antiplatelet or statin at home   Atrial fibrillation, chronic (HCC) - Cardizem CD and amiodarone to be resumed after peg placement -- will switch to Lovenox 1 mg per kilogram bridge till peg tube is placed -- eliquis on hold   Type 2 diabetes mellitus with peripheral neuropathy (HCC) - Patient will be placed on supplemental coverage with NovoLog. - continue basal coverage. - continue Neurontin.   BPH (benign prostatic hyperplasia) - continue Flomax.   Coronary artery disease - patient will be seen by cardiology Dr. Mariah Milling for preop clearance.  Procedures: EGD Family communication : none today.  Consults : oncology, cardiology, general surgery CODE STATUS: full DVT Prophylaxis : Lovenox Level of care: Med-Surg Status is: Inpatient Remains inpatient appropriate because: new diagnosis of possible esophageal mass, peg tube placement to be done today    TOTAL TIME TAKING CARE OF THIS PATIENT: 35 minutes.  >50% time spent on counselling and coordination of care  Note: This dictation was prepared with Dragon dictation along with smaller phrase technology. Any transcriptional errors that result from this process are unintentional.  Enedina Finner M.D    [facility]   CC: Primary care physician; [name], MD

## 2023-03-18 NOTE — Plan of Care (Signed)
  Problem: Education: Goal: Knowledge of General Education information will improve Description: Including pain rating scale, medication(s)/side effects and non-pharmacologic comfort measures Outcome: Progressing   Problem: Activity: Goal: Risk for activity intolerance will decrease Outcome: Progressing   Problem: Nutrition: Goal: Adequate nutrition will be maintained Outcome: Progressing   Problem: Pain Management: Goal: General experience of comfort will improve Outcome: Progressing

## 2023-03-18 NOTE — Anesthesia Postprocedure Evaluation (Signed)
Anesthesia Post Note  Patient: [name].  Procedure(s) Performed: INSERTION OF GASTROSTOMY TUBE INSERTION PORT-A-CATH (Chest)  Patient location during evaluation: PACU Anesthesia Type: General Level of consciousness: awake and alert, oriented and patient cooperative Pain management: pain level controlled Vital Signs Assessment: post-procedure vital signs reviewed and stable Respiratory status: spontaneous breathing, nonlabored ventilation and respiratory function stable Cardiovascular status: blood pressure returned to baseline and stable Postop Assessment: adequate PO intake Anesthetic complications: no   No notable events documented.   Last Vitals:  Vitals:   03/18/23 1315 03/18/23 1325  BP: (!) 142/77 128/74  Pulse: 88 85  Resp: 14 16  Temp:  36.7 C  SpO2: 92% 94%    Last Pain:  Vitals:   03/18/23 1325  TempSrc:   PainSc: 0-No pain                 Reed Breech

## 2023-03-18 NOTE — Progress Notes (Signed)
Pt currently in surgery- PEG, port.  Will again reevaluate tomorrow. GB

## 2023-03-18 NOTE — Transfer of Care (Signed)
Immediate Anesthesia Transfer of Care Note  Patient: [name].  Procedure(s) Performed: INSERTION OF GASTROSTOMY TUBE INSERTION PORT-A-CATH (Chest)  Patient Location: PACU  Anesthesia Type:General  Level of Consciousness: awake, alert , and oriented  Airway & Oxygen Therapy: Patient Spontanous Breathing  Post-op Assessment: Report given to RN and Post -op Vital signs reviewed and stable  Post vital signs: Reviewed and stable  Last Vitals:  Vitals Value Taken Time  BP 156/79 03/18/23 1302  Temp    Pulse 89 03/18/23 1302  Resp 13 03/18/23 1302  SpO2 95 % 03/18/23 1302    Last Pain:  Vitals:   03/18/23 1000  TempSrc: Temporal  PainSc: 0-No pain         Complications: No notable events documented.

## 2023-03-18 NOTE — Anesthesia Preprocedure Evaluation (Addendum)
Anesthesia Evaluation  Patient identified by MRN, date of birth, ID band Patient confused    Reviewed: Allergy & Precautions, NPO status , Patient's Chart, lab work & pertinent test results  History of Anesthesia Complications Negative for: history of anesthetic complications  Airway Mallampati: IV   Neck ROM: Full    Dental  (+) Upper Dentures, Poor Dentition   Pulmonary asthma , sleep apnea , COPD, former smoker   Pulmonary exam normal breath sounds clear to auscultation       Cardiovascular hypertension, + CAD (s/p MI, CABG, stents), + Peripheral Vascular Disease (s/p L BKA; AAA s/p repair) and +CHF  Normal cardiovascular exam+ dysrhythmias (a fib on Eliquis)  Rhythm:Regular Rate:Normal  ECG 02/13/22:  Atrial fibrillation with rapid ventricular response with premature ventricular or aberrantly conducted complexes Right bundle branch block Left anterior fascicular block * Bifascicular block * Cannot rule out Inferior infarct (masked by fascicular block?) , age undetermined   Neuro/Psych  PSYCHIATRIC DISORDERS  Depression    negative neurological ROS     GI/Hepatic negative GI ROS,,,  Endo/Other  diabetes, Type 2  Obesity   Renal/GU negative Renal ROS     Musculoskeletal Gout    Abdominal   Peds  Hematology negative hematology ROS (+)   Anesthesia Other Findings Cardiology note 11/20/22:  1.  Coronary artery disease involving native coronary arteries with other forms of angina: He is doing reasonably well overall.  His symptoms are controlled with diltiazem and Imdur.  Most recent PCI was in 2021 on the right coronary artery.  His LIMA to LAD was patent but SVG grafts were occluded.  Left circumflex is occluded with no patent vein graft.  Continue aggressive medical therapy.  Given the presence of coronary artery disease and peripheral arterial disease, I elected to keep him on aspirin even though he is on  anticoagulation with Eliquis.   2.  Paroxysmal atrial fibrillation: Currently in sinus rhythm on amiodarone and diltiazem.  His LFTs in May were normal.  TSH in January was normal.  He is on long-term anticoagulation with Eliquis.   3.  Chronic diastolic heart failure: He appears to be euvolemic on current dose of furosemide.  Not a good candidate for an SGLT2 inhibitor due to increased risk of infection.   4.  Peripheral arterial disease: Followed by vascular surgery.  Status post bilateral BKA   5.  Hyperlipidemia: Intolerance to statins due to myalgia.  He is currently on Zetia 10 mg once daily.  Most recent lipid profile showed an LDL of 78.   6.  Essential hypertension: Blood pressure is controlled.   Disposition:   FU with me in 6 months  Reproductive/Obstetrics                             Anesthesia Physical Anesthesia Plan  ASA: 4  Anesthesia Plan: General   Post-op Pain Management:    Induction: Intravenous  PONV Risk Score and Plan: 2 and Propofol infusion, TIVA, Treatment may vary due to age or medical condition and Ondansetron  Airway Management Planned: Natural Airway  Additional Equipment:   Intra-op Plan:   Post-operative Plan:   Informed Consent: I have reviewed the patients History and Physical, chart, labs and discussed the procedure including the risks, benefits and alternatives for the proposed anesthesia with the patient or authorized representative who has indicated his/her understanding and acceptance.       Plan Discussed with: CRNA  Anesthesia Plan Comments: (LMA/GETA backup discussed.  Patient consented for risks of anesthesia including but not limited to:  - adverse reactions to medications - damage to eyes, teeth, lips or other oral mucosa - nerve damage due to positioning  - sore throat or hoarseness - damage to heart, brain, nerves, lungs, other parts of body or loss of life  Informed patient about role of CRNA  in peri- and intra-operative care.  Patient voiced understanding.)        Anesthesia Quick Evaluation

## 2023-03-18 NOTE — Op Note (Signed)
Preoperative diagnosis:  Esophageal mass  Postoperative diagnosis: Same.   Procedure: Stamm gastrostomy.  Anesthesia: GETA  Surgeon: Dr. Hazle Quant, MD  Wound Classification: Clean Contaminated  Indications: Patient is a 67 y.o. male required prolonged enteral nutrition because of dysphagia due to esophageal mass. Unable to pass endoscope to do PEG.  Stamm gastrostomy was chosen as the route of nutritional support.   Findings: 1. Gastrostomy 22 Fr in place 2. Normal gastric anatomy 3. Adequate hemostasis  Description of procedure: The patient was placed in the supine position and general endotracheal anesthesia was induced. A time-out was completed verifying correct patient, procedure, site, positioning, and implant(s) and/or special equipment prior to beginning this procedure. Preoperative antibiotics were given. The abdomen was prepped and draped in the usual sterile fashion. A short upper midline incision was made and deepened through the subcutaneous tissues with electrocautery. Hemostasis was assured. The linea alba was incised and the peritoneal cavity entered.  The stomach was identified and a location on the anterior wall near the greater curvature was selected. That site was approximated to the chosen exit site and found to reach without tension. A small incision was made and the 22-French Gastrostome was passed through the anterior abdominal wall and into the field.  A purse-string suture of 2-0 silk was placed on the anterior surface of the stomach and a gastrotomy was made with electrocautery in the center of the suture. The catheter was inserted into the lumen of the stomach. The purse-string suture was secured in place in such a manner as to inkwell the stomach around the catheter. A second, outer concentric pursestring was placed in a similar manner and tied to further inkwell the stomach.  The stomach was then tacked to the anterior abdominal wall at the catheter entrance site  with several with the same silk sutures used for the purse-string in such a manner as to prevent leakage or torsion. The catheter was secured to the skin with 2-0 Prolene.  Hemostasis was checked and omentum was brought adjacent to the surgical field. The fascia was closed with a running suture of Vicryl 0.  The skin was closed with subcuticular sutures of Monocryl 3-0.   The patient tolerated the procedure well and was taken to the postanesthesia care unit in stable condition  Specimen: None  Complications: None  EBL: 5 mL

## 2023-03-19 ENCOUNTER — Encounter: Payer: Self-pay | Admitting: General Surgery

## 2023-03-19 DIAGNOSIS — K2289 Other specified disease of esophagus: Secondary | ICD-10-CM | POA: Diagnosis not present

## 2023-03-19 LAB — MAGNESIUM: Magnesium: 1.8 mg/dL (ref 1.7–2.4)

## 2023-03-19 LAB — GLUCOSE, CAPILLARY
Glucose-Capillary: 142 mg/dL — ABNORMAL HIGH (ref 70–99)
Glucose-Capillary: 163 mg/dL — ABNORMAL HIGH (ref 70–99)
Glucose-Capillary: 173 mg/dL — ABNORMAL HIGH (ref 70–99)
Glucose-Capillary: 217 mg/dL — ABNORMAL HIGH (ref 70–99)
Glucose-Capillary: 223 mg/dL — ABNORMAL HIGH (ref 70–99)
Glucose-Capillary: 232 mg/dL — ABNORMAL HIGH (ref 70–99)

## 2023-03-19 LAB — PHOSPHORUS: Phosphorus: 3.1 mg/dL (ref 2.5–4.6)

## 2023-03-19 LAB — POTASSIUM: Potassium: 3.8 mmol/L (ref 3.5–5.1)

## 2023-03-19 MED ORDER — ACETAMINOPHEN 650 MG RE SUPP: 650.0000 mg | Freq: Four times a day (QID) | RECTAL | Status: DC | PRN

## 2023-03-19 MED ORDER — PROSOURCE TF20 ENFIT COMPATIBL EN LIQD
60.0000 mL | Freq: Two times a day (BID) | ENTERAL | Status: DC
  Administered 2023-03-19 – 2023-03-20 (×3): 60 mL
  Filled 2023-03-19: qty 60

## 2023-03-19 MED ORDER — OXYCODONE HCL 5 MG PO TABS
5.0000 mg | ORAL_TABLET | Freq: Four times a day (QID) | ORAL | Status: DC | PRN
  Administered 2023-03-19 – 2023-03-21 (×6): 5 mg
  Filled 2023-03-19 (×5): qty 1

## 2023-03-19 MED ORDER — PRAMIPEXOLE DIHYDROCHLORIDE 1 MG PO TABS
2.0000 mg | ORAL_TABLET | Freq: Every day | ORAL | Status: DC
Start: 2023-03-19 — End: 2023-03-22
  Administered 2023-03-19 – 2023-03-21 (×3): 2 mg
  Filled 2023-03-19 (×3): qty 2

## 2023-03-19 MED ORDER — ONDANSETRON HCL 4 MG/2ML IJ SOLN: 4.0000 mg | Freq: Four times a day (QID) | INTRAMUSCULAR | Status: DC | PRN

## 2023-03-19 MED ORDER — DILTIAZEM HCL 30 MG PO TABS
75.0000 mg | ORAL_TABLET | Freq: Four times a day (QID) | ORAL | Status: DC
  Administered 2023-03-19 – 2023-03-22 (×12): 75 mg
  Filled 2023-03-19 (×12): qty 3

## 2023-03-19 MED ORDER — ZINC SULFATE 220 (50 ZN) MG PO CAPS
220.0000 mg | ORAL_CAPSULE | Freq: Every day | ORAL | Status: DC
  Administered 2023-03-20 – 2023-03-22 (×3): 220 mg
  Filled 2023-03-19 (×3): qty 1

## 2023-03-19 MED ORDER — AMIODARONE HCL 200 MG PO TABS
200.0000 mg | ORAL_TABLET | Freq: Every day | ORAL | Status: DC
  Administered 2023-03-19 – 2023-03-22 (×4): 200 mg
  Filled 2023-03-19 (×3): qty 1

## 2023-03-19 MED ORDER — MORPHINE SULFATE (PF) 2 MG/ML IV SOLN: 1.0000 mg | Freq: Four times a day (QID) | INTRAVENOUS | Status: DC | PRN

## 2023-03-19 MED ORDER — OXYCODONE HCL 5 MG PO TABS
5.0000 mg | ORAL_TABLET | Freq: Four times a day (QID) | ORAL | Status: DC | PRN
  Filled 2023-03-19: qty 1

## 2023-03-19 MED ORDER — VITAMIN C 500 MG PO TABS
500.0000 mg | ORAL_TABLET | Freq: Every day | ORAL | Status: DC
Start: 2023-03-19 — End: 2023-03-22
  Administered 2023-03-19 – 2023-03-22 (×4): 500 mg
  Filled 2023-03-19 (×3): qty 1

## 2023-03-19 MED ORDER — LOSARTAN POTASSIUM 25 MG PO TABS
25.0000 mg | ORAL_TABLET | Freq: Every day | ORAL | Status: DC
Start: 2023-03-19 — End: 2023-03-21
  Administered 2023-03-19 – 2023-03-20 (×2): 25 mg
  Filled 2023-03-19: qty 1

## 2023-03-19 MED ORDER — VITAMIN B-12 1000 MCG PO TABS
5000.0000 ug | ORAL_TABLET | Freq: Every day | ORAL | Status: DC
  Administered 2023-03-19 – 2023-03-22 (×4): 5000 ug
  Filled 2023-03-19 (×3): qty 5

## 2023-03-19 MED ORDER — GABAPENTIN 300 MG PO CAPS
300.0000 mg | ORAL_CAPSULE | Freq: Every day | ORAL | Status: DC
  Administered 2023-03-19 – 2023-03-21 (×3): 300 mg
  Filled 2023-03-19 (×3): qty 1

## 2023-03-19 MED ORDER — ORAL CARE MOUTH RINSE
15.0000 mL | OROMUCOSAL | Status: DC
  Administered 2023-03-19 – 2023-03-22 (×13): 15 mL via OROMUCOSAL

## 2023-03-19 MED ORDER — ACETAMINOPHEN 325 MG PO TABS
650.0000 mg | ORAL_TABLET | Freq: Four times a day (QID) | ORAL | Status: DC | PRN
  Administered 2023-03-19: 650 mg
  Filled 2023-03-19: qty 2

## 2023-03-19 MED ORDER — APIXABAN 5 MG PO TABS
5.0000 mg | ORAL_TABLET | Freq: Two times a day (BID) | ORAL | Status: DC
  Administered 2023-03-19 – 2023-03-22 (×6): 5 mg
  Filled 2023-03-19 (×6): qty 1

## 2023-03-19 MED ORDER — EZETIMIBE 10 MG PO TABS
10.0000 mg | ORAL_TABLET | Freq: Every day | ORAL | Status: DC
  Administered 2023-03-19 – 2023-03-22 (×4): 10 mg
  Filled 2023-03-19 (×4): qty 1

## 2023-03-19 MED ORDER — TRAZODONE HCL 50 MG PO TABS
25.0000 mg | ORAL_TABLET | Freq: Every evening | ORAL | Status: DC | PRN
  Administered 2023-03-19 – 2023-03-21 (×2): 25 mg
  Filled 2023-03-19 (×2): qty 1

## 2023-03-19 MED ORDER — ISOSORBIDE DINITRATE 30 MG PO TABS
30.0000 mg | ORAL_TABLET | Freq: Two times a day (BID) | ORAL | Status: DC
  Administered 2023-03-19 – 2023-03-22 (×6): 30 mg
  Filled 2023-03-19 (×7): qty 1

## 2023-03-19 MED ORDER — JUVEN PO PACK
1.0000 | PACK | Freq: Two times a day (BID) | ORAL | Status: DC
Start: 2023-03-19 — End: 2023-03-20
  Administered 2023-03-19 – 2023-03-20 (×3): 1

## 2023-03-19 MED ORDER — LEVOTHYROXINE SODIUM 25 MCG PO TABS
25.0000 ug | ORAL_TABLET | Freq: Every day | ORAL | Status: DC
  Administered 2023-03-20 – 2023-03-22 (×3): 25 ug
  Filled 2023-03-19 (×3): qty 1

## 2023-03-19 MED ORDER — ONDANSETRON HCL 4 MG PO TABS
4.0000 mg | ORAL_TABLET | Freq: Four times a day (QID) | ORAL | Status: DC | PRN
  Administered 2023-03-20: 4 mg
  Filled 2023-03-19: qty 1

## 2023-03-19 MED ORDER — VITAMIN D 25 MCG (1000 UNIT) PO TABS
1000.0000 [IU] | ORAL_TABLET | Freq: Every day | ORAL | Status: DC
  Administered 2023-03-19 – 2023-03-22 (×4): 1000 [IU]
  Filled 2023-03-19 (×3): qty 1

## 2023-03-19 MED ORDER — FREE WATER
160.0000 mL | Status: DC
  Administered 2023-03-19 – 2023-03-20 (×7): 160 mL

## 2023-03-19 MED ORDER — MORPHINE SULFATE (PF) 2 MG/ML IV SOLN
1.0000 mg | Freq: Four times a day (QID) | INTRAVENOUS | Status: DC | PRN
  Administered 2023-03-19: 1 mg via INTRAVENOUS
  Filled 2023-03-19: qty 1

## 2023-03-19 MED ORDER — PRIMIDONE 250 MG PO TABS
250.0000 mg | ORAL_TABLET | Freq: Two times a day (BID) | ORAL | Status: DC
  Administered 2023-03-19 – 2023-03-22 (×6): 250 mg
  Filled 2023-03-19 (×6): qty 1

## 2023-03-19 MED ORDER — FERROUS SULFATE 220 (44 FE) MG/5ML PO SOLN
220.0000 mg | Freq: Every day | ORAL | Status: DC
  Administered 2023-03-20 – 2023-03-22 (×3): 220 mg via ORAL
  Filled 2023-03-19 (×3): qty 5

## 2023-03-19 MED ORDER — ORAL CARE MOUTH RINSE: 15.0000 mL | OROMUCOSAL | Status: DC | PRN

## 2023-03-19 NOTE — Plan of Care (Signed)
  Problem: Education: Goal: Knowledge of General Education information will improve Description: Including pain rating scale, medication(s)/side effects and non-pharmacologic comfort measures Outcome: Progressing   Problem: Clinical Measurements: Goal: Ability to maintain clinical measurements within normal limits will improve Outcome: Progressing Goal: Will remain free from infection Outcome: Progressing Goal: Diagnostic test results will improve Outcome: Progressing Goal: Respiratory complications will improve Outcome: Progressing Goal: Cardiovascular complication will be avoided Outcome: Progressing   Problem: Nutrition: Goal: Adequate nutrition will be maintained Outcome: Progressing   Problem: Pain Management: Goal: General experience of comfort will improve Outcome: Progressing   Problem: Safety: Goal: Ability to remain free from injury will improve Outcome: Progressing   Problem: Skin Integrity: Goal: Risk for impaired skin integrity will decrease Outcome: Progressing

## 2023-03-19 NOTE — Progress Notes (Signed)
Nutrition Follow-up  DOCUMENTATION CODES:   Obesity unspecified  INTERVENTION:   -D/c Juven -Continue TF via g-tube    Osmolite 1.5 @ 40 ml/hr and increase by 10 ml every 8 hours to goal rate of 60 ml/hr.    60 ml Prosource TF BID   160 ml free water flush every 4 hours   Tube feeding regimen provides 2320 kcal (100% of needs), 130 grams of protein, and 1097 ml of H2O.  Total free water: 2057 ml daily   -Continue 100 mg thiamine daily x 5 days due to high refeeding risk -Monitor Mg, K, and Phos and replete as needed secondary to high refeeding risk   -Once closer to discharge, consider transition to bolus feedings:    474 ml (2 cartons) Osmolite 1.5   70 ml free water flush before and after each feeding administration   Tube feeding regimen provides 2480 kcal (100% of needs), 119 grams of protein, and 1448 ml of H2O. Total free water: 2008 ml daily  NUTRITION DIAGNOSIS:   Inadequate oral intake related to inability to eat as evidenced by NPO status.  Ongoing  GOAL:   Patient will meet greater than or equal to 90% of their needs  Progressing   MONITOR:   TF tolerance  REASON FOR ASSESSMENT:   Malnutrition Screening Tool    ASSESSMENT:   Pt with medical history significant for asthma, COPD, CHF, coronary artery disease, depression, gout, type 2 diabetes mellitus, hypertension, dyslipidemia, PVD and OSA on CPAP, who presented with acute onset of dysphagia which has been going on over the last several weeks including both solids and liquids however he was able to drink small sips of water and has been tolerating his secretions.  11/15- s/p EGD- food removal in lower third of esophagus, malignant appearing esophageal stenosis, completely obstructing, esophageal tumor in lowe third of esophagus (biopsied)  118- s/p Stamm gastrostomy   Reviewed I/O's: -299 ml x 24 hours and -972 ml since admission  UOP: 1 L x 24 hours   Per oncology notes, pt is not a candidate  for surgical resection and plan for chemoradiation. Biopsies still pending.   Pt lying in bed at time of visit. Pt feeling well today. He reports "they just turned the TF up". Noted Osmolite 1.5 infusing @ 40 ml/hr via g-tube. Pt denies any nausea, vomiting, and abdominal pain. The only complain he offers is slight discomfort from port site. Pt joked with this RD stating "my TF doesn't taste like a t-bone".   Medications reviewed and include vitamin C, vitamin C, ferrous sulfate, cardizem, neurontin, thiamine, and zinc sulfate.   Labs reviewed: Mg, Mg, and Phos daily. CBGS: 115-173 (inpatient orders for glycemic control are 0-15 units insulin aspart every 4 hours).    Diet Order:   Diet Order     None       EDUCATION NEEDS:   Education needs have been addressed  Skin:  Skin Assessment: Skin Integrity Issues: Skin Integrity Issues:: Incisions Incisions: closed abdomen, rt neck, rt chest  Last BM:  03/14/23  Height:   Ht Readings from Last 1 Encounters:  03/18/23 6\' 4"  (1.93 m)    Weight:   Wt Readings from Last 1 Encounters:  03/19/23 127.4 kg    Ideal Body Weight:  75.2 kg (adjusted for bilateral BKAs)  BMI:  Body mass index is 34.19 kg/m.  Estimated Nutritional Needs:   Kcal:  2250-2450  Protein:  115-130 grams  Fluid:  > 2  Azzie Almas, RD, LDN, CDCES Registered Dietitian III Certified Diabetes Care and Education Specialist Please refer to [facility] for RD and/or RD on-call/weekend/after hours pager

## 2023-03-19 NOTE — Progress Notes (Signed)
Alec A [employer]   DOB:Nov 25, 1955   BJ#:[id number]    Subjective: Patient status post -PEG tube placement /Mediport placement - on 11/18.  Tolerated the procedures well.  patient resting in the bed.  Complains of mild incisional pain.  Tube feeds in progress Objective:  Vitals:   03/19/23 0737 03/19/23 0812  BP: 139/80   Pulse: 96   Resp: 18   Temp: 99.1 F (37.3 C)   SpO2: 94% 91%     Intake/Output Summary (Last 24 hours) at 03/19/2023 1309 Last data filed at 03/19/2023 1007 Gross per 24 hour  Intake 556.5 ml  Output 1300 ml  Net -743.5 ml   Bilateral BKA.  Physical Exam Vitals and nursing note reviewed.  HENT:     Head: Normocephalic and atraumatic.     Mouth/Throat:     Pharynx: Oropharynx is clear.  Eyes:     Extraocular Movements: Extraocular movements intact.     Pupils: Pupils are equal, round, and reactive to light.  Cardiovascular:     Rate and Rhythm: Normal rate and regular rhythm.  Pulmonary:     Comments: Decreased breath sounds bilaterally.  Abdominal:     Palpations: Abdomen is soft.  Musculoskeletal:        General: Normal range of motion.     Cervical back: Normal range of motion.  Skin:    General: Skin is warm.  Neurological:     General: No focal deficit present.     Mental Status: He is alert and oriented to person, place, and time.  Psychiatric:        Behavior: Behavior normal.        Judgment: Judgment normal.      Labs:  Lab Results  Component Value Date   WBC 6.7 03/15/2023   HGB 11.4 (L) 03/15/2023   HCT 36.1 (L) 03/15/2023   MCV 93.8 03/15/2023   PLT 160 03/15/2023   NEUTROABS 4.7 03/14/2023    Lab Results  Component Value Date   NA 138 03/15/2023   K 3.8 03/19/2023   CL 100 03/15/2023   CO2 30 03/15/2023    Studies:  DG C-Arm 1-60 Min-No Report  Result Date: 03/18/2023 Fluoroscopy was utilized by the requesting physician.  No radiographic interpretation.   ECHOCARDIOGRAM COMPLETE  Result Date:  03/17/2023    ECHOCARDIOGRAM REPORT   Patient Name:   [name]. Date of Exam: 03/17/2023 Medical Rec #:  [id number]             Height:       76.0 in Accession #:    [id number]            Weight:       297.6 lb Date of Birth:  1955/06/29            BSA:          2.623 m Patient Age:    67 years              BP:           138/73 mmHg Patient Gender: M                     HR:           76 bpm. Exam Location:  [facility] Procedure: 2D Echo and Strain Analysis Indications:     CAD Native Vessel I25.10  History:         Patient has prior history of Echocardiogram  examinations, most                  recent 10/05/2021.  Sonographer:     Overton Mam RDCS, FASE Referring Phys:  [id number] Antonieta Iba Diagnosing Phys: Julien Nordmann MD  Sonographer Comments: Global longitudinal strain was attempted. IMPRESSIONS  1. Left ventricular ejection fraction, by estimation, is 55 %. The left ventricle has normal function. The left ventricle demonstrates regional wall motion abnormalities (mild hypokinesis of the basal inferior wall). There is mild left ventricular hypertrophy. Left ventricular diastolic parameters are consistent with Grade I diastolic dysfunction (impaired relaxation). The average left ventricular global longitudinal strain is -12.0 %.  2. Right ventricular systolic function is normal. The right ventricular size is normal. Tricuspid regurgitation signal is inadequate for assessing PA pressure.  3. The mitral valve is normal in structure. No evidence of mitral valve regurgitation. No evidence of mitral stenosis.  4. The aortic valve is tricuspid. There is mild calcification of the aortic valve. Aortic valve regurgitation is not visualized. Aortic valve sclerosis/calcification is present, without any evidence of aortic stenosis.  5. The inferior vena cava is normal in size with greater than 50% respiratory variability, suggesting right atrial pressure of 3 mmHg. FINDINGS  Left Ventricle: Left ventricular  ejection fraction, by estimation, is 55 %. Left ventricular ejection fraction by PLAX is 57 %. The left ventricle has normal function. The left ventricle demonstrates regional wall motion abnormalities. The average left ventricular global longitudinal strain is -12.0 %. The left ventricular internal cavity size was normal in size. There is mild left ventricular hypertrophy. Left ventricular diastolic parameters are consistent with Grade I diastolic dysfunction (impaired  relaxation). Right Ventricle: The right ventricular size is normal. No increase in right ventricular wall thickness. Right ventricular systolic function is normal. Tricuspid regurgitation signal is inadequate for assessing PA pressure. Left Atrium: Left atrial size was normal in size. Right Atrium: Right atrial size was normal in size. Pericardium: There is no evidence of pericardial effusion. Mitral Valve: The mitral valve is normal in structure. No evidence of mitral valve regurgitation. No evidence of mitral valve stenosis. Tricuspid Valve: The tricuspid valve is normal in structure. Tricuspid valve regurgitation is mild . No evidence of tricuspid stenosis. Aortic Valve: The aortic valve is tricuspid. There is mild calcification of the aortic valve. Aortic valve regurgitation is not visualized. Aortic valve sclerosis/calcification is present, without any evidence of aortic stenosis. Aortic valve peak gradient measures 11.3 mmHg. Pulmonic Valve: The pulmonic valve was normal in structure. Pulmonic valve regurgitation is not visualized. No evidence of pulmonic stenosis. Aorta: The aortic root is normal in size and structure. Venous: The inferior vena cava is normal in size with greater than 50% respiratory variability, suggesting right atrial pressure of 3 mmHg. IAS/Shunts: No atrial level shunt detected by color flow Doppler.  LEFT VENTRICLE PLAX 2D LV EF:         Left            Diastology                ventricular     LV e' medial:    6.64 cm/s                 ejection        LV E/e' medial:  11.2                fraction by     LV e' lateral:   9.79 cm/s  PLAX is 57      LV E/e' lateral: 7.6                %. LVIDd:         5.30 cm         2D LVIDs:         3.70 cm         Longitudinal LV PW:         1.40 cm         Strain LV IVS:        1.40 cm         2D Strain GLS  -12.0 % LVOT diam:     2.35 cm         Avg: LV SV:         83 LV SV Index:   32 LVOT Area:     4.34 cm  RIGHT VENTRICLE RV Basal diam:  3.80 cm RV S prime:     9.57 cm/s TAPSE (M-mode): 1.8 cm LEFT ATRIUM             Index        RIGHT ATRIUM           Index LA diam:        3.80 cm 1.45 cm/m   RA Area:     19.30 cm LA Vol (A2C):   64.8 ml 24.71 ml/m  RA Volume:   56.30 ml  21.46 ml/m LA Vol (A4C):   46.0 ml 17.54 ml/m LA Biplane Vol: 59.6 ml 22.72 ml/m  AORTIC VALVE                 PULMONIC VALVE AV Area (Vmax): 2.27 cm     PV Vmax:        0.87 m/s AV Vmax:        168.00 cm/s  PV Peak grad:   3.0 mmHg AV Peak Grad:   11.3 mmHg    RVOT Peak grad: 4 mmHg LVOT Vmax:      87.80 cm/s LVOT Vmean:     58.600 cm/s LVOT VTI:       0.192 m  AORTA Ao Root diam: 3.70 cm Ao Asc diam:  3.10 cm MITRAL VALVE MV Area (PHT): 3.54 cm    SHUNTS MV Decel Time: 214 msec    Systemic VTI:  0.19 m MV E velocity: 74.40 cm/s  Systemic Diam: 2.35 cm MV A velocity: 97.50 cm/s MV E/A ratio:  0.76 Julien Nordmann MD Electronically signed by Julien Nordmann MD Signature Date/Time: 03/17/2023/2:49:50 PM    Final     67 y.o. male patient with a history of smoking  CAD' A.fib  DM/peripheral vascular disease status post bilateral amputations currently admitted to hospital for dysphagia noted to have Esophagus mass.    # Adenocarcinoma GE junction mass--status post GI evaluation/EGD.  Pathology pending.-CT scan chest and pelvis-lower esophagus mass noted-possible involvement of the paraesophageal fat/concerning for early invasion of the mediastinum.  Otherwise no pathologic lymph node noted or evidence of  distant metastatic disease.  Clinically not resectable.  Patient would benefit from chemoradiation.  Discussed with patient that unfortunately his disease is unlikely to be curable.  However with chemoradiation-reasonable chance of local control.    # mild Anemia: Likely due to GI bleed/secondary to #1-stable   # CAD/A.fib -currently on Lovenox [Dr.Arida]-given upcoming plan for feeding tube placement.  Evaluation with cardiology.   # DM- [O conell]Hb A1c- 7.2.    #  Weight loss/malnutrition; s/p  tube placement [Dr. Cintron]-    # ECOG performance status:2.  bil BKA- [sec to DM/ PVD; June 2023]- in wheel chair  # Discussed with the patient that I would recommend his wife to be around-tomorrow around noon-discussed treatment options/prognosis etc.   [name], MD 03/19/2023  1:09 PM

## 2023-03-19 NOTE — Progress Notes (Signed)
[facility]  - [place] at [facility]   PATIENT NAME: [name]    MR#:  [id number]  DATE OF BIRTH:  02-19-1956  SUBJECTIVE:  no family at bedside. Patient denies any complaints.  Tolerating tube feeding well. Started back on PO meds through the peg tube  VITALS:  Blood pressure 139/80, pulse 96, temperature 99.1 F (37.3 C), resp. rate 18, height 6\' 4"  (1.93 m), weight 127.4 kg, SpO2 91%.  PHYSICAL EXAMINATION:   GENERAL:  67 y.o.-year-old patient with no acute distress. Obese LUNGS: Normal breath sounds bilaterally, no wheezing, Port + CARDIOVASCULAR: S1, S2 normal. No murmur   ABDOMEN: Soft, nontender, nondistended. Peg tube+ EXTREMITIES: are well healed amputation stump  NEUROLOGIC: nonfocal  patient is alert and awake  LABORATORY PANEL:  CBC Recent Labs  Lab 03/15/23 0402  WBC 6.7  HGB 11.4*  HCT 36.1*  PLT 160    Chemistries  Recent Labs  Lab 03/14/23 1618 03/15/23 0402 03/19/23 0540  NA 137 138  --   K 4.4 3.7 3.8  CL 98 100  --   CO2 30 30  --   GLUCOSE 193* 188*  --   BUN 25* 27*  --   CREATININE 1.27* 1.14  --   CALCIUM 8.9 8.5*  --   MG  --   --  1.8  AST 15  --   --   ALT 19  --   --   ALKPHOS 122  --   --   BILITOT 0.4  --   --    Cardiac Enzymes No results for input(s): "TROPONINI" in the last 168 hours. RADIOLOGY:  DG C-Arm 1-60 Min-No Report  Result Date: 03/18/2023 Fluoroscopy was utilized by the requesting physician.  No radiographic interpretation.    Assessment and Plan  Kent A Harrison Vitalis. is a 67 y.o. Caucasian male with medical history significant for asthma, COPD, CHF, coronary artery disease, depression, gout, type 2 diabetes mellitus, hypertension, dyslipidemia, PVD and OSA on CPAP, who presented to the emergency room with dysphagia which has been going on over the last several weeks including both solids and liquids   Outpatient barium swallow revealed the following: 1. Extensive filling defects  throughout the mid and distal thoracic esophagus, likely reflecting retained food and pills and markedly limiting mucosal evaluation. An obstructing distal esophageal mass or stricture cannot be excluded. Endoscopy should be considered. 2. Markedly delayed esophageal contrast transit, with only a small amount of contrast passing into the stomach during the examination. 3. Known small hiatal hernia.  Dysphagia due to Obstructing Esophageal mass S/p EGD which shows some food impaction which was removed.  Also found to have a large occluding mass at GE junction.  Biopsy were taken, highly concerning for malignancy. GI is concerned that patient will need long-term TPN as he will not be a candidate for PEG tube placement due to occlusion of esophagus. -Oncology Dr Durward Parcel-- patient not a surgical candidate. -- Gen. surgery Dr. Hazle Quant to place Jejunostomy peg tube and port placement on Monday -- seen by Dr. Mariah Milling for preop -- will have dietitian see patient for tube feeding -- patient started on continues tube feeding. Follow-up with dietitian regarding bolus feeds, wife/family to assist with feeds at home along with education. -- Patient has had port placement. He'll follow-up with Dr. Donneta Romberg for further workup as outpatient   AKI (acute kidney injury) (HCC) -due to diminished p.o. intake. Creatinine improved to 1.14 with IV fluid.  Hypothyroidism -  Synthroid.   Essential hypertension - resume home meds  PAD (peripheral artery disease) (HCC) S/p bilateral BKA. Not on any antiplatelet or statin at home PT to be initiated. Patient has prosthetic legs for both lower extremity. He is supposed to see outpatient PT at [facility] starting next week   Atrial fibrillation, chronic (HCC) - Cardizem CD and amiodarone to be resumed after peg placement -- will switch to Lovenox 1 mg per kilogram bridge till peg tube is placed -- eliquis on hold   Type 2 diabetes mellitus with peripheral  neuropathy (HCC) - Patient will be placed on supplemental coverage with NovoLog. - continue basal coverage. - continue Neurontin.   BPH (benign prostatic hyperplasia) - continue Flomax.   Coronary artery disease - patient was  seen by cardiology Dr. Mariah Milling for preop clearance.  Procedures: EGD Family communication : none today.  Consults : oncology, cardiology, general surgery CODE STATUS: full DVT Prophylaxis : Lovenox Level of care: Med-Surg Status is: Inpatient Remains inpatient appropriate because: new diagnosis of possible esophageal mass, peg tube feeding    TOTAL TIME TAKING CARE OF THIS PATIENT: 35 minutes.  >50% time spent on counselling and coordination of care  Note: This dictation was prepared with Dragon dictation along with smaller phrase technology. Any transcriptional errors that result from this process are unintentional.  Enedina Finner M.D    [facility]   CC: Primary care physician; [name], MD

## 2023-03-19 NOTE — Care Management Important Message (Signed)
Important Message  Patient Details  Name: [name]. MRN: [id number] Date of Birth: [birth year]   Important Message Given:  Yes - Medicare IM     Bernadette Hoit 03/19/2023, 10:37 AM

## 2023-03-19 NOTE — Progress Notes (Signed)
Patient ID: [name], male   DOB: Aug 16, 1955, 67 y.o.   MRN: [id number]     SURGICAL PROGRESS NOTE   Hospital Day(s): 5.   Interval History: Patient seen and examined, no acute events or new complaints overnight. Patient reports having soreness around the gastrostomy area.  Patient is tolerating feeding well.  Slowly increasing rate up to goal.  Vital signs in last 24 hours: [min-max] current  Temp:  [97.5 F (36.4 C)-99.1 F (37.3 C)] 99.1 F (37.3 C) (11/19 0737) Pulse Rate:  [83-96] 96 (11/19 0737) Resp:  [14-18] 18 (11/19 0737) BP: (124-142)/(72-80) 139/80 (11/19 0737) SpO2:  [90 %-99 %] 91 % (11/19 0812) Weight:  [127.4 kg] 127.4 kg (11/19 0500)     Height: 6\' 4"  (193 cm) (s/p bilateral BKA) Weight: 127.4 kg BMI (Calculated): 34.21   Physical Exam:  Constitutional: alert, cooperative and no distress  Respiratory: breathing non-labored at rest  Cardiovascular: regular rate and sinus rhythm  Gastrointestinal: soft, non-tender, and non-distended.  Gastrostomy in place.  No sign of leakage.  Labs:     Latest Ref Rng & Units 03/15/2023    4:02 AM 03/14/2023    4:18 PM 06/06/2022    2:59 AM  CBC  WBC 4.0 - 10.5 K/uL 6.7  7.3  9.8   Hemoglobin 13.0 - 17.0 g/dL 55.7  32.2  8.5   Hematocrit 39.0 - 52.0 % 36.1  42.4  27.6   Platelets 150 - 400 K/uL 160  170  310       Latest Ref Rng & Units 03/19/2023    5:40 AM 03/15/2023    4:02 AM 03/14/2023    4:18 PM  CMP  Glucose 70 - 99 mg/dL  025  427   BUN 8 - 23 mg/dL  27  25   Creatinine 0.62 - 1.24 mg/dL  3.76  2.83   Sodium 151 - 145 mmol/L  138  137   Potassium 3.5 - 5.1 mmol/L 3.8  3.7  4.4   Chloride 98 - 111 mmol/L  100  98   CO2 22 - 32 mmol/L  30  30   Calcium 8.9 - 10.3 mg/dL  8.5  8.9   Total Protein 6.5 - 8.1 g/dL   7.3   Total Bilirubin <1.2 mg/dL   0.4   Alkaline Phos 38 - 126 U/L   122   AST 15 - 41 U/L   15   ALT 0 - 44 U/L   19     Imaging studies: No new pertinent imaging  studies   Assessment/Plan:  67 y.o. male with malignant neoplasm of the esophagus 1 Day Post-Op s/p insertion of Port-A-Cath and gastrostomy.  -Patient tolerated the procedures well -Right upper chest Chemo-Port incision without erythema, no complications. -Gastrostomy working adequately.  Currently feeding rate at 40 and has seen toleration to reach goal of 60 -No sign of complications -No contraindication to discharge home once medically stable and patient has his education and equipment coordinated.  Gae Gallop, MD

## 2023-03-19 NOTE — Plan of Care (Signed)
  Problem: Education: Goal: Knowledge of General Education information will improve Description: Including pain rating scale, medication(s)/side effects and non-pharmacologic comfort measures Outcome: Progressing   Problem: Clinical Measurements: Goal: Respiratory complications will improve Outcome: Progressing Goal: Cardiovascular complication will be avoided Outcome: Progressing   

## 2023-03-20 ENCOUNTER — Encounter: Payer: Self-pay | Admitting: Internal Medicine

## 2023-03-20 ENCOUNTER — Ambulatory Visit: Payer: Medicare Other

## 2023-03-20 ENCOUNTER — Other Ambulatory Visit: Payer: Self-pay | Admitting: Internal Medicine

## 2023-03-20 DIAGNOSIS — C159 Malignant neoplasm of esophagus, unspecified: Secondary | ICD-10-CM

## 2023-03-20 DIAGNOSIS — R1319 Other dysphagia: Secondary | ICD-10-CM | POA: Diagnosis not present

## 2023-03-20 LAB — GLUCOSE, CAPILLARY
Glucose-Capillary: 186 mg/dL — ABNORMAL HIGH (ref 70–99)
Glucose-Capillary: 207 mg/dL — ABNORMAL HIGH (ref 70–99)
Glucose-Capillary: 215 mg/dL — ABNORMAL HIGH (ref 70–99)
Glucose-Capillary: 220 mg/dL — ABNORMAL HIGH (ref 70–99)
Glucose-Capillary: 225 mg/dL — ABNORMAL HIGH (ref 70–99)
Glucose-Capillary: 237 mg/dL — ABNORMAL HIGH (ref 70–99)
Glucose-Capillary: 241 mg/dL — ABNORMAL HIGH (ref 70–99)
Glucose-Capillary: 243 mg/dL — ABNORMAL HIGH (ref 70–99)

## 2023-03-20 LAB — MAGNESIUM: Magnesium: 2 mg/dL (ref 1.7–2.4)

## 2023-03-20 LAB — POTASSIUM: Potassium: 4 mmol/L (ref 3.5–5.1)

## 2023-03-20 LAB — PHOSPHORUS: Phosphorus: 3.2 mg/dL (ref 2.5–4.6)

## 2023-03-20 MED ORDER — LIDOCAINE-PRILOCAINE 2.5-2.5 % EX CREA: TOPICAL_CREAM | CUTANEOUS | 3 refills | Status: DC

## 2023-03-20 MED ORDER — FREE WATER
140.0000 mL | Freq: Three times a day (TID) | Status: DC
  Administered 2023-03-20 – 2023-03-22 (×9): 140 mL

## 2023-03-20 MED ORDER — OSMOLITE 1.5 CAL PO LIQD
237.0000 mL | Freq: Three times a day (TID) | ORAL | Status: AC
  Administered 2023-03-20 (×2): 237 mL

## 2023-03-20 MED ORDER — OSMOLITE 1.5 CAL PO LIQD
474.0000 mL | Freq: Three times a day (TID) | ORAL | Status: DC
  Administered 2023-03-21 – 2023-03-22 (×7): 474 mL

## 2023-03-20 NOTE — Progress Notes (Signed)
START ON PATHWAY REGIMEN - Gastroesophageal     A cycle is every 7 days, concurrent with RT:     Paclitaxel      Carboplatin   **Always confirm dose/schedule in your pharmacy ordering system**  Patient Characteristics: Esophageal & GE Junction, Adenocarcinoma, Preoperative or Nonsurgical Candidate, M0 (Clinical Staging), cT2 or Higher or cN+, Unresectable/Nonsurgical Candidate (Any cT) Therapeutic Status: Preoperative or Nonsurgical Candidate, M0 (Clinical Staging) Histology: Adenocarcinoma Disease Classification: GE Junction AJCC Grade: G2 AJCC 8 Stage Grouping: III AJCC T Category: cT4a AJCC N Category: cN0 AJCC M Category: cM0 Intent of Therapy: Non-Curative / Palliative Intent, Discussed with Patient

## 2023-03-20 NOTE — Progress Notes (Signed)
Pt has bil BKA- needs [facility] transport as per his wife, [name]. Call [name] for appts.  PET scan ASAP- week of 11/25 or 12/02- as pt in hospital this week. Tawanna Cooler ed- pt's wife can attend.  12/12- MD; labs- cbc/cmp; carbo-taxol weekly  12/19- labs- cbc/cmp; carbo-taxol weekly  Thanks, GB

## 2023-03-20 NOTE — Progress Notes (Addendum)
Progress Note    Alec A [employer]  ZOX:[id number] DOB: Aug 29, 1955  DOA: 03/14/2023 PCP: [name], MD      Brief Narrative:    Medical records reviewed and are as summarized below:  Alec Bender. is a 67 y.o. male  with medical history significant for asthma, COPD, chronic diastolic CHF, coronary artery disease, depression, gout, type 2 diabetes mellitus, hypertension, dyslipidemia, PVD s/p bilateral BKA, OSA on CPAP, who presented to the hospital because of difficulty swallowing (both solids and liquids) that has been going on for several weeks.     Assessment/Plan:   Principal Problem:   Dysphagia Active Problems:   AKI (acute kidney injury) (HCC)   Paroxysmal atrial fibrillation (HCC)   Hypothyroidism   Essential hypertension   PAD (peripheral artery disease) (HCC)   Chronic diastolic CHF (congestive heart failure) (HCC)   Atrial fibrillation, chronic (HCC)   Type 2 diabetes mellitus with peripheral neuropathy (HCC)   BPH (benign prostatic hyperplasia)   Coronary artery disease   Esophageal adenocarcinoma (HCC)   Nutrition Problem: Inadequate oral intake Etiology: inability to eat  Signs/Symptoms: NPO status   Body mass index is 34.27 kg/m.   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 Continue enteral nutrition via gastrostomy tube. Follow-up with oncologist.   AKI: Improved   Paroxysmal atrial fibrillation: Continue Eliquis, amiodarone and diltiazem   CAD s/p CABG in 2006, PAD s/p bilateral BKA   Chronic diastolic CHF: Compensated.   2D echo on 03/17/2023 showed EF estimated at 55%, grade 1 diastolic dysfunction, mild LVH   Type II DM with peripheral neuropathy: NovoLog as needed for hyperglycemia.  Hemoglobin A1c was 7.8 on 03/15/2023 (improved from 11.2 on 05/22/2022).   Comorbidities include BPH, hypertension, hypothyroidism    Diet Order     None                 Consultants: Cardiologist General Surgeon Oncologist Gastroenterologist  Procedures: Gastrostomy on 03/18/2023 Placement of right IJ central venous catheter with subcutaneous port on 03/18/2023 EGD on 03/15/2023    Medications:    amiodarone  200 mg Per Tube Daily   apixaban  5 mg Per Tube BID   ascorbic acid  500 mg Per Tube Daily   budesonide  0.5 mg Nebulization BID   cholecalciferol  1,000 Units Per Tube Daily   cyanocobalamin  5,000 mcg Per Tube Daily   diltiazem  75 mg Per Tube Q6H   ezetimibe  10 mg Per Tube Daily   feeding supplement (OSMOLITE 1.5 CAL)  237 mL Per Tube TID AC & HS   [START ON 03/21/2023] feeding supplement (OSMOLITE 1.5 CAL)  474 mL Per Tube TID AC & HS   ferrous sulfate  220 mg Oral Daily   free water  140 mL Per Tube TID AC & HS   gabapentin  300 mg Per Tube QHS   insulin aspart  0-15 Units Subcutaneous Q4H   isosorbide dinitrate  30 mg Per Tube BID   levothyroxine  25 mcg Per Tube Q0600   losartan  25 mg Per Tube Daily   multivitamin with minerals  1 tablet Oral Daily   nystatin  1 Application Topical BID   mouth rinse  15 mL Mouth Rinse 4 times per day   pramipexole  2 mg Per Tube QHS   primidone  250 mg Per Tube BID   thiamine  100 mg Per Tube Daily  zinc sulfate (50mg  elemental zinc)  220 mg Per Tube Daily   Continuous Infusions:   Anti-infectives (From admission, onward)    None              Family Communication/Anticipated D/C date and plan/Code Status   DVT prophylaxis:  apixaban (ELIQUIS) tablet 5 mg     Code Status: Full Code  Family Communication: None Disposition Plan: Plan to discharge home with home PT and hospital bed   Status is: Inpatient Remains inpatient appropriate because: Dysphagia s/p PEG tube placement, esophageal cancer       Subjective:   Interval events noted.  He complains of sore abdomen.  Objective:    Vitals:   03/20/23 0731 03/20/23 0749 03/20/23  1235 03/20/23 1531  BP:  94/64 94/64 112/68  Pulse:  77  86  Resp:  16  16  Temp:  98.5 F (36.9 C)  97.8 F (36.6 C)  TempSrc:      SpO2: 90% 95%  93%  Weight:      Height:       No data found.   Intake/Output Summary (Last 24 hours) at 03/20/2023 1540 Last data filed at 03/20/2023 1200 Gross per 24 hour  Intake 350 ml  Output 800 ml  Net -450 ml   Filed Weights   03/18/23 1000 03/19/23 0500 03/20/23 0717  Weight: 124.4 kg 127.4 kg 127.7 kg    Exam:  GEN: NAD SKIN: Warm and dry EYES: No pallor or icterus ENT: MMM CV: RRR PULM: CTA B ABD: soft, obese, NT, +BS, +PEG tube CNS: AAO x 3, non focal EXT: B/l BKA        Data Reviewed:   I have personally reviewed following labs and imaging studies:  Labs: Labs show the following:   Basic Metabolic Panel: Recent Labs  Lab 03/14/23 1618 03/15/23 0402 03/19/23 0540 03/20/23 0538  NA 137 138  --   --   K 4.4 3.7 3.8 4.0  CL 98 100  --   --   CO2 30 30  --   --   GLUCOSE 193* 188*  --   --   BUN 25* 27*  --   --   CREATININE 1.27* 1.14  --   --   CALCIUM 8.9 8.5*  --   --   MG  --   --  1.8 2.0  PHOS  --   --  3.1 3.2   GFR Estimated Creatinine Clearance: 90.5 mL/min (by C-G formula based on SCr of 1.14 mg/dL). Liver Function Tests: Recent Labs  Lab 03/14/23 1618  AST 15  ALT 19  ALKPHOS 122  BILITOT 0.4  PROT 7.3  ALBUMIN 3.7   No results for input(s): "LIPASE", "AMYLASE" in the last 168 hours. No results for input(s): "AMMONIA" in the last 168 hours. Coagulation profile No results for input(s): "INR", "PROTIME" in the last 168 hours.  CBC: Recent Labs  Lab 03/14/23 1618 03/15/23 0402  WBC 7.3 6.7  NEUTROABS 4.7  --   HGB 13.0 11.4*  HCT 42.4 36.1*  MCV 95.7 93.8  PLT 170 160   Cardiac Enzymes: No results for input(s): "CKTOTAL", "CKMB", "CKMBINDEX", "TROPONINI" in the last 168 hours. BNP (last 3 results) No results for input(s): "PROBNP" in the last 8760 hours. CBG: Recent  Labs  Lab 03/20/23 0013 03/20/23 0405 03/20/23 0415 03/20/23 0746 03/20/23 1159  GLUCAP 186* 237* 243* 241* 225*   D-Dimer: No results for input(s): "DDIMER" in the last  72 hours. Hgb A1c: No results for input(s): "HGBA1C" in the last 72 hours. Lipid Profile: No results for input(s): "CHOL", "HDL", "LDLCALC", "TRIG", "CHOLHDL", "LDLDIRECT" in the last 72 hours. Thyroid function studies: No results for input(s): "TSH", "T4TOTAL", "T3FREE", "THYROIDAB" in the last 72 hours.  Invalid input(s): "FREET3" Anemia work up: No results for input(s): "VITAMINB12", "FOLATE", "FERRITIN", "TIBC", "IRON", "RETICCTPCT" in the last 72 hours. Sepsis Labs: Recent Labs  Lab 03/14/23 1618 03/15/23 0402  WBC 7.3 6.7    Microbiology No results found for this or any previous visit (from the past 240 hour(s)).  Procedures and diagnostic studies:  No results found.             LOS: 6 days   [name]  [facility]   Pager on [email or web address]. If 7PM-7AM, please contact night-coverage at [email or web address]     03/20/2023, 3:40 PM

## 2023-03-20 NOTE — Plan of Care (Signed)

## 2023-03-20 NOTE — Progress Notes (Signed)
Pt has Cardizem ordered. Per MD hold. Pt's BP is 94/64 and HR is 77.

## 2023-03-20 NOTE — Progress Notes (Signed)
Gregoire [employer]   DOB:02-24-1956   RU#:[id number]    Subjective: Patient status post -PEG tube placement /Mediport placement - on 11/18.  Tolerated the procedures well.  patient resting in the chair. Complains of mild incisional pain.  Tube feeds in progress.  Objective:  Vitals:   03/20/23 1531 03/20/23 1824  BP: 112/68 112/68  Pulse: 86   Resp: 16   Temp: 97.8 F (36.6 C)   SpO2: 93%      Intake/Output Summary (Last 24 hours) at 03/20/2023 2132 Last data filed at 03/20/2023 1200 Gross per 24 hour  Intake 350 ml  Output 400 ml  Net -50 ml   Bilateral BKA.  Physical Exam Vitals and nursing note reviewed.  HENT:     Head: Normocephalic and atraumatic.     Mouth/Throat:     Pharynx: Oropharynx is clear.  Eyes:     Extraocular Movements: Extraocular movements intact.     Pupils: Pupils are equal, round, and reactive to light.  Cardiovascular:     Rate and Rhythm: Normal rate and regular rhythm.  Pulmonary:     Comments: Decreased breath sounds bilaterally.  Abdominal:     Palpations: Abdomen is soft.  Musculoskeletal:        General: Normal range of motion.     Cervical back: Normal range of motion.  Skin:    General: Skin is warm.  Neurological:     General: No focal deficit present.     Mental Status: He is alert and oriented to person, place, and time.  Psychiatric:        Behavior: Behavior normal.        Judgment: Judgment normal.      Labs:  Lab Results  Component Value Date   WBC 6.7 03/15/2023   HGB 11.4 (L) 03/15/2023   HCT 36.1 (L) 03/15/2023   MCV 93.8 03/15/2023   PLT 160 03/15/2023   NEUTROABS 4.7 03/14/2023    Lab Results  Component Value Date   NA 138 03/15/2023   K 4.0 03/20/2023   CL 100 03/15/2023   CO2 30 03/15/2023    Studies:  No results found.  67 y.o. male patient with a history of smoking  CAD' A.fib  DM/peripheral vascular disease status post bilateral amputations currently admitted to hospital for dysphagia noted  to have Esophagus mass.    # Adenocarcinoma GE junction mass--status post GI evaluation/EGD.  Pathology - adenocarcinoma of gastroesophageal cancer. CT scan chest and pelvis-lower esophagus mass noted-possible involvement of the paraesophageal fat/concerning for early invasion of the mediastinum- clinically suggestive of T4 disease/stage IV.  Clinically not resectable. Otherwise no pathologic lymph node noted or evidence of distant metastatic disease. .  Patient would benefit from chemoradiation for local control.  Discussed with patient that unfortunately his disease is unlikely to be curable. Recommend carbo-taxol weekly- along with radiation daily. Patient is interested in proceeding with treatment after discharge from the hospital. Will plan out patient PET scan.    # mild Anemia: Likely due to GI bleed/secondary to #1-stable   # CAD/A.fib -currently on Lovenox [Dr.Arida]-given upcoming plan for feeding tube placement.  Evaluation with cardiology.   # DM- [O conell]Hb A1c- 7.2.    # Weight loss/malnutrition; s/p  tube placement [Dr. Cintron]-    # ECOG performance status:2.  bil BKA- [sec to DM/ PVD; June 2023]- in wheel chair. Patient needs transportation/[facility].   # 40 minutes face-to-face with the patient discussing the above plan of care; more  than 50% of time spent on prognosis/ natural history; counseling and coordination.  [name], MD 03/20/2023  9:32 PM

## 2023-03-20 NOTE — Plan of Care (Signed)
  Problem: Education: Goal: Knowledge of General Education information will improve Description: Including pain rating scale, medication(s)/side effects and non-pharmacologic comfort measures Outcome: Progressing   Problem: Health Behavior/Discharge Planning: Goal: Ability to manage health-related needs will improve Outcome: Progressing   Problem: Clinical Measurements: Goal: Respiratory complications will improve Outcome: Progressing   Problem: Activity: Goal: Risk for activity intolerance will decrease Outcome: Progressing   Problem: Nutrition: Goal: Adequate nutrition will be maintained Outcome: Progressing   Problem: Coping: Goal: Level of anxiety will decrease Outcome: Progressing   Problem: Pain Management: Goal: General experience of comfort will improve Outcome: Progressing   Problem: Safety: Goal: Ability to remain free from injury will improve Outcome: Progressing   Problem: Skin Integrity: Goal: Risk for impaired skin integrity will decrease Outcome: Progressing   Problem: Education: Goal: Knowledge of General Education information will improve Description: Including pain rating scale, medication(s)/side effects and non-pharmacologic comfort measures Outcome: Progressing

## 2023-03-20 NOTE — Progress Notes (Signed)
Nutrition Follow-up  DOCUMENTATION CODES:   Obesity unspecified  INTERVENTION:   -Transition to bolus feedings:    474 ml (2 cartons) Osmolite 1.5 4 times daily (8 cartons total daily)   70 ml free water flush before and after each feeding administration   Tube feeding regimen provides 2480 kcal (100% of needs), 119 grams of protein, and 1448 ml of H2O. Total free water: 2008 ml daily  -Continue 100 mg thiamine daily x 5 days due to high refeeding risk -Monitor Mg, K, and Phos and replete as needed secondary to high refeeding risk  NUTRITION DIAGNOSIS:   Inadequate oral intake related to inability to eat as evidenced by NPO status.  Ongoing  GOAL:   Patient will meet greater than or equal to 90% of their needs  Met with TF  MONITOR:   TF tolerance  REASON FOR ASSESSMENT:   Malnutrition Screening Tool    ASSESSMENT:   Pt with medical history significant for asthma, COPD, CHF, coronary artery disease, depression, gout, type 2 diabetes mellitus, hypertension, dyslipidemia, PVD and OSA on CPAP, who presented with acute onset of dysphagia which has been going on over the last several weeks including both solids and liquids however he was able to drink small sips of water and has been tolerating his secretions.  11/15- s/p EGD- food removal in lower third of esophagus, malignant appearing esophageal stenosis, completely obstructing, esophageal tumor in lowe third of esophagus (biopsied)  118- s/p Stamm gastrostomy   Reviewed I/O's: -350 ml x 24 hours and -1.3 L since admission  UOP: 700 ml x 24 hours   Case discussed with RN and MD. Pt tolerating TF well at goal rate of 60 ml/hr. Plan to transition to bolus feedings to prepare for for discharge. RN to help educate pt and family on TF and PEG care. Transitions of care consult placed for TF discharge needs.   RD reached out top diabetes coordinators for insulin recommendations for transition to bolus feeds to help  optimize glycemic control.  Medications reviewed and include vitamin C, pulmicort, vitamin D3, vitamin B12, ferrous sulfate, neurontin, thiamine, and zinc.   Labs reviewed: CBGS: 186-241 (inpatient orders for glycemic control are 0-15 units insulin aspart every 4 hours).    Diet Order:   Diet Order     None       EDUCATION NEEDS:   Education needs have been addressed  Skin:  Skin Assessment: Skin Integrity Issues: Skin Integrity Issues:: Incisions Incisions: closed abdomen, rt neck, rt chest  Last BM:  03/14/23  Height:   Ht Readings from Last 1 Encounters:  03/18/23 6\' 4"  (1.93 m)    Weight:   Wt Readings from Last 1 Encounters:  03/19/23 127.4 kg    Ideal Body Weight:  75.2 kg (adjusted for bilateral BKAs)  BMI:  Body mass index is 34.19 kg/m.  Estimated Nutritional Needs:   Kcal:  2956-2130  Protein:  115-130 grams  Fluid:  > 2 L    Levada Schilling, RD, LDN, CDCES Registered Dietitian III Certified Diabetes Care and Education Specialist Please refer to [facility] for RD and/or RD on-call/weekend/after hours pager

## 2023-03-20 NOTE — Evaluation (Signed)
Physical Therapy Evaluation Patient Details Name: [name]. MRN: [id number] DOB: April 28, 1956 Today's Date: 03/20/2023  History of Present Illness  Pt is a 67 y/o M admitted on 03/15/23 after presenting with c/o difficulty swallowing. Pt found to have obstructing esophageal mass (adenocarcinoma at GE junction mass). Pt is s/p PEG tube placement on 03/18/23. PMH: asthma, COPD, CHF, CAD, depression, gout, DM2, HTN, dyslipidemia, PVD, OSA on CPAP  Clinical Impression  Pt seen for PT evaluation with pt agreeable to tx. Pt reports prior to admission he was able to complete bed>w/c with slide board transfer, w/c<>toilet without assistance or board, and family used hoyer lift to transfer him back to bed at the end of the day. Pt has been attending OPPT & recently started working on standing with BLE prosthesis. On this date, pt is able to transfer to sitting EOB but requires HOB fully elevated, use of bed rails. Pt completes bed>recliner via slide board with min assist for scooting from higher to lower surface. Pt would benefit from ongoing PT services to address strengthening, activity tolerance, & balance.        If plan is discharge home, recommend the following: A lot of help with walking and/or transfers;A lot of help with bathing/dressing/bathroom;Assistance with cooking/housework;Assist for transportation;Help with stairs or ramp for entrance   Can travel by private vehicle        Equipment Recommendations Hospital bed  Recommendations for Other Services       Functional Status Assessment Patient has had a recent decline in their functional status and demonstrates the ability to make significant improvements in function in a reasonable and predictable amount of time.     Precautions / Restrictions Precautions Precautions: Fall Precaution Comments: B BKA with prosthesis, Peg tube Restrictions Weight Bearing Restrictions: No RLE Weight Bearing: Weight bearing as tolerated LLE  Weight Bearing: Weight bearing as tolerated      Mobility  Bed Mobility Overal bed mobility: Needs Assistance Bed Mobility: Supine to Sit     Supine to sit: Supervision, HOB elevated, Used rails (HOB fully elevated)     General bed mobility comments: exits L side of bed    Transfers Overall transfer level: Needs assistance Equipment used: Sliding board Transfers: Bed to chair/wheelchair/BSC            Lateral/Scoot Transfers: Min assist, From elevated surface, With slide board (Pt with slight truncal posterior lean vs anterior, extra time & assistance to scoot across board.)      Ambulation/Gait                  Stairs            Wheelchair Mobility     Tilt Bed    Modified Rankin (Stroke Patients Only)       Balance Overall balance assessment: Needs assistance Sitting-balance support: Bilateral upper extremity supported, Feet unsupported Sitting balance-Leahy Scale: Fair                                       Pertinent Vitals/Pain Pain Assessment Pain Assessment: Faces Faces Pain Scale: Hurts even more Pain Location: stomach Pain Descriptors / Indicators: Headache Pain Intervention(s): Monitored during session, Repositioned, Limited activity within patient's tolerance    Home Living Family/patient expects to be discharged to:: Private residence Living Arrangements: Spouse/significant other Available Help at Discharge: Family;Available 24 hours/day Type of Home: House Home Access: Ramped  entrance       Home Layout: One level Home Equipment: Wheelchair - manual;BSC/3in1 Secondary school teacher, [employer])      Prior Function               Mobility Comments: Pt is able to complete bed mobility & bed>w/c transfers with slide board without assistance, lateral scoot w/c<>toilet without board, propel w/c, family/friends use hoyer lift to assist pt back to bed each night. Pt was attending OPPT, recently received BLE prosthesis &  starting to work on standing.       Extremity/Trunk Assessment   Upper Extremity Assessment Upper Extremity Assessment: Generalized weakness    Lower Extremity Assessment Lower Extremity Assessment: Generalized weakness (hx of B BKA, dry skin on end of R residual limb)       Communication   Communication Communication: No apparent difficulties  Cognition Arousal: Alert Behavior During Therapy: WFL for tasks assessed/performed Overall Cognitive Status: Within Functional Limits for tasks assessed                                          General Comments      Exercises     Assessment/Plan    PT Assessment Patient needs continued PT services  PT Problem List Decreased strength;Pain;Decreased activity tolerance;Decreased balance;Decreased mobility;Decreased knowledge of use of DME       PT Treatment Interventions Balance training;DME instruction;Modalities;Neuromuscular re-education;Functional mobility training;Therapeutic activities;Therapeutic exercise;Manual techniques;Wheelchair mobility training;Patient/family education    PT Goals (Current goals can be found in the Care Plan section)  Acute Rehab PT Goals Patient Stated Goal: get better PT Goal Formulation: With patient Time For Goal Achievement: 04/03/23 Potential to Achieve Goals: Good Additional Goals Additional Goal #1: Pt will propel w/c x 150 ft with mod I to increase independence with mobility.    Frequency Min 1X/week     Co-evaluation               AM-PAC PT "6 Clicks" Mobility  Outcome Measure Help needed turning from your back to your side while in a flat bed without using bedrails?: A Little Help needed moving from lying on your back to sitting on the side of a flat bed without using bedrails?: A Lot Help needed moving to and from a bed to a chair (including a wheelchair)?: A Little Help needed standing up from a chair using your arms (e.g., wheelchair or bedside chair)?:  Total Help needed to walk in hospital room?: Total Help needed climbing 3-5 steps with a railing? : Total 6 Click Score: 11    End of Session   Activity Tolerance: Patient tolerated treatment well Patient left: in chair;with chair alarm set;with call bell/phone within reach Nurse Communication: Mobility status PT Visit Diagnosis: Muscle weakness (generalized) (M62.81);Other abnormalities of gait and mobility (R26.89)    Time: 0865-7846 PT Time Calculation (min) (ACUTE ONLY): 23 min   Charges:   PT Evaluation $PT Eval Low Complexity: 1 Low   PT General Charges $$ ACUTE PT VISIT: 1 Visit         Aleda Grana, PT, DPT 03/20/23, 1:31 PM   Sandi Mariscal 03/20/2023, 1:29 PM

## 2023-03-21 ENCOUNTER — Other Ambulatory Visit: Payer: Self-pay

## 2023-03-21 ENCOUNTER — Telehealth: Payer: Self-pay

## 2023-03-21 ENCOUNTER — Institutional Professional Consult (permissible substitution): Payer: Medicare Other | Admitting: Radiation Oncology

## 2023-03-21 DIAGNOSIS — R1319 Other dysphagia: Secondary | ICD-10-CM | POA: Diagnosis not present

## 2023-03-21 LAB — GLUCOSE, CAPILLARY
Glucose-Capillary: 183 mg/dL — ABNORMAL HIGH (ref 70–99)
Glucose-Capillary: 184 mg/dL — ABNORMAL HIGH (ref 70–99)
Glucose-Capillary: 220 mg/dL — ABNORMAL HIGH (ref 70–99)
Glucose-Capillary: 383 mg/dL — ABNORMAL HIGH (ref 70–99)
Glucose-Capillary: 378 mg/dL — ABNORMAL HIGH (ref 70–99)

## 2023-03-21 LAB — MAGNESIUM: Magnesium: 2.2 mg/dL (ref 1.7–2.4)

## 2023-03-21 LAB — POTASSIUM: Potassium: 4.4 mmol/L (ref 3.5–5.1)

## 2023-03-21 LAB — PHOSPHORUS: Phosphorus: 3.7 mg/dL (ref 2.5–4.6)

## 2023-03-21 MED ORDER — INSULIN GLARGINE-YFGN 100 UNIT/ML ~~LOC~~ SOLN
13.0000 [IU] | Freq: Two times a day (BID) | SUBCUTANEOUS | Status: DC
  Filled 2023-03-21: qty 0.13

## 2023-03-21 MED ORDER — DILTIAZEM HCL 30 MG PO TABS: 75.0000 mg | ORAL_TABLET | Freq: Four times a day (QID) | ORAL | 0 refills | Status: DC

## 2023-03-21 MED ORDER — INSULIN GLARGINE-YFGN 100 UNIT/ML ~~LOC~~ SOLN
20.0000 [IU] | Freq: Two times a day (BID) | SUBCUTANEOUS | Status: DC
  Administered 2023-03-21 – 2023-03-22 (×2): 20 [IU] via SUBCUTANEOUS
  Filled 2023-03-21 (×3): qty 0.2

## 2023-03-21 NOTE — Inpatient Diabetes Management (Addendum)
Inpatient Diabetes Program Recommendations  AACE/ADA: New Consensus Statement on Inpatient Glycemic Control (2015)  Target Ranges:  Prepandial:   less than 140 mg/dL      Peak postprandial:   less than 180 mg/dL (1-2 hours)      Critically ill patients:  140 - 180 mg/dL    Latest Reference Range & Units 03/20/23 00:13 03/20/23 04:05 03/20/23 04:15 03/20/23 07:46 03/20/23 11:59 03/20/23 16:29 03/20/23 20:03  Glucose-Capillary 70 - 99 mg/dL 841 (H)  3 units Novolog  237 (H) 243 (H)  5 units Novolog  241 (H)  5 units Novolog  225 (H)  6 units Novolog  207 (H)  3 units Novolog  215 (H)  5 units Novolog   (H): Data is abnormally high  Latest Reference Range & Units 03/20/23 23:53 03/21/23 05:19 03/21/23 07:40  Glucose-Capillary 70 - 99 mg/dL 660 (H)  5 units Novolog  183 (H)  3 units Novolog  184 (H)  3 units Novolog   (H): Data is abnormally high    History: DM  Home DM Meds: Freestyle Libre 3 CGM       Humalog 14-22 units TID per SSI       Lantus 15 units AM/ 25 units PM  Current Orders: Novolog Moderate Correction Scale/ SSI (0-15 units) Q4 hours    ENDO: Dr. Gershon Crane Last Seen 11/13/2022 Was instructed to take the following: Reduce Semglee to 40 units bid Take 6 units of Humalog with every meal Take the following SSI with Humalog:  If 200-250 add 2 units  If 251-300 add 4 units  If 301-350 add 6 units  If 351-400 add 8 units  If over 400 add 10 units.     MD- Note pt switched to Bolus feeds (Osmolite 474 ml QID--8am, 12pm, 5pm, 10pm)  Pt also takes Lantus Insulin at home.  Please consider:  1. Start weight based dose Basal Insulin: Semglee 13 units BID (0.2 units/kg) split dose  2. Change Novolog SSI to TID AC + HS (now that pt getting bolus feeds at those times)  3. Start Novolog 3 units TID AC + HS with each bolus feed given (HOLD if bolus feed HELD for any reason)     --Will follow patient during hospitalization--  Ambrose Finland RN, MSN, CDCES Diabetes Coordinator Inpatient Glycemic Control Team Team Pager: [phone] (8a-5p)

## 2023-03-21 NOTE — Progress Notes (Signed)
Physical Therapy Treatment Patient Details Name: [name]. MRN: [id number] DOB: 1955-08-26 Today's Date: 03/21/2023   History of Present Illness Pt is a 67 y/o M admitted on 03/15/23 after presenting with c/o difficulty swallowing. Pt found to have obstructing esophageal mass (adenocarcinoma at GE junction mass). Pt is s/p PEG tube placement on 03/18/23. PMH: asthma, COPD, CHF, CAD, depression, gout, DM2, HTN, dyslipidemia, PVD, OSA on CPAP    PT Comments  Pt was sitting in recliner upon arrival. He is A and O x 4. Used slide board to get to recliner with OT but wanted to try to stand from recliner surface. Author assisted pt with applying BLE prosthetics.  He then required +2 max assist to stand from recliner. Elected to have pt use slide board to return to EOB when surface height could be adjusted. Stood 2 more time EOB with + 2 mod assist. Stood ~ 2- 4 minutes each time. Pt limited overall by fatigue. Returned to supine position post session with call bell in reach and all needs within reach. DC recs remain appropriate to maximize his independence and safety with all ADLs.    If plan is discharge home, recommend the following: A lot of help with walking and/or transfers;A lot of help with bathing/dressing/bathroom;Assistance with cooking/housework;Assist for transportation;Help with stairs or ramp for entrance     Equipment Recommendations  Hospital bed       Precautions / Restrictions Precautions Precautions: Fall Precaution Comments: B BKA with prosthesis, Peg tube Restrictions Weight Bearing Restrictions: No RLE Weight Bearing: Weight bearing as tolerated LLE Weight Bearing: Weight bearing as tolerated     Mobility  Bed Mobility Overal bed mobility: Needs Assistance Bed Mobility: Sit to Supine  Sit to supine: Min assist, Mod assist General bed mobility comments: Pt required extensive assistance to reposition back into supine position from EOB sitting     Transfers Overall transfer level: Needs assistance Equipment used: Sliding board, Rolling walker (2 wheels) Transfers: Sit to/from Stand, Bed to chair/wheelchair/BSC Sit to Stand: +2 physical assistance, +2 safety/equipment, Mod assist, Max assist, From elevated surface   Lateral/Scoot Transfers: Mod assist, Max assist, With slide board General transfer comment: +2 max to stand form recliner. +2 mod o stand form elevated EOB surface. Mod-max assist to go from lower recliner to EOB via slideboard    Ambulation/Gait  General Gait Details: non ambulatory   Balance Overall balance assessment: Needs assistance Sitting-balance support: Bilateral upper extremity supported, Feet unsupported Sitting balance-Leahy Scale: Fair     Standing balance support: Reliant on assistive device for balance, Bilateral upper extremity supported Standing balance-Leahy Scale: Poor    Cognition Arousal: Alert Behavior During Therapy: WFL for tasks assessed/performed Overall Cognitive Status: Within Functional Limits for tasks assessed  General Comments: Pt is A and O x 3. Agreeable to session and motivted throughout           General Comments General comments (skin integrity, edema, etc.): Reviewed ther ex to promote return in function, strength, and overall safety with ADLs.      Pertinent Vitals/Pain Pain Assessment Pain Assessment: No/denies pain    Home Living Family/patient expects to be discharged to:: Private residence Living Arrangements: Spouse/significant other Available Help at Discharge: Family;Available 24 hours/day Type of Home: House Home Access: Ramped entrance       Home Layout: One level Home Equipment: Wheelchair - manual;BSC/3in1 Additional Comments: Bariatric RW; knee scooter, hoyer, slide board    Prior Function  PT Goals (current goals can now be found in the care plan section) Acute Rehab PT Goals Patient Stated Goal: get better Progress towards PT  goals: Progressing toward goals    Frequency    Min 1X/week       AM-PAC PT "6 Clicks" Mobility   Outcome Measure  Help needed turning from your back to your side while in a flat bed without using bedrails?: A Little Help needed moving from lying on your back to sitting on the side of a flat bed without using bedrails?: A Lot Help needed moving to and from a bed to a chair (including a wheelchair)?: A Lot Help needed standing up from a chair using your arms (e.g., wheelchair or bedside chair)?: Total Help needed to walk in hospital room?: Total Help needed climbing 3-5 steps with a railing? : Total 6 Click Score: 10    End of Session   Activity Tolerance: Patient tolerated treatment well Patient left: in bed;with call bell/phone within reach;with bed alarm set Nurse Communication: Mobility status PT Visit Diagnosis: Muscle weakness (generalized) (M62.81);Other abnormalities of gait and mobility (R26.89)     Time: 1610-9604 PT Time Calculation (min) (ACUTE ONLY): 20 min  Charges:    $Therapeutic Activity: 8-22 mins PT General Charges $$ ACUTE PT VISIT: 1 Visit                    Jetta Lout PTA 03/21/23, 1:49 PM

## 2023-03-21 NOTE — Telephone Encounter (Signed)
Prior auth started thru covermymeds for Emla cream. (Key: [id number]) Rx #: [id number] Lidocaine-Prilocaine 2.5-2.5% cream

## 2023-03-21 NOTE — Progress Notes (Addendum)
Progress Note    Alec A [employer]  ZOX:[id number] DOB: Feb 10, 1956  DOA: 03/14/2023 PCP: [name], MD      Brief Narrative:    Medical records reviewed and are as summarized below:  [name]. is a 67 y.o. male  with medical history significant for asthma, COPD, chronic diastolic CHF, coronary artery disease, depression, gout, type 2 diabetes mellitus, hypertension, dyslipidemia, PVD s/p bilateral BKA, OSA on CPAP, who presented to the hospital because of difficulty swallowing (both solids and liquids) that has been going on for several weeks.     Assessment/Plan:   Principal Problem:   Dysphagia Active Problems:   AKI (acute kidney injury) (HCC)   Paroxysmal atrial fibrillation (HCC)   Hypothyroidism   Essential hypertension   PAD (peripheral artery disease) (HCC)   Chronic diastolic CHF (congestive heart failure) (HCC)   Atrial fibrillation, chronic (HCC)   Type 2 diabetes mellitus with peripheral neuropathy (HCC)   BPH (benign prostatic hyperplasia)   Coronary artery disease   Esophageal adenocarcinoma (HCC)   Nutrition Problem: Inadequate oral intake Etiology: inability to eat  Signs/Symptoms: NPO status   Body mass index is 34.97 kg/m.   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 Continue enteral nutrition via gastrostomy tube. Case manager to set up home supplies. Plan for PET scan as an outpatient. Outpatient follow-up with Dr. Donneta Romberg, oncologist and Dr. Carmie Kanner, radiation oncologist.   AKI: Improved   Paroxysmal atrial fibrillation: Continue Eliquis, amiodarone and diltiazem   Hypotension: BP has been running low.  Isosorbide mononitrate and losartan have been discontinued.  Outpatient follow-up for close monitoring of blood pressure.  Antihypertensives can be adjusted accordingly.   CAD s/p CABG in 2006, PAD s/p bilateral BKA   Chronic  diastolic CHF: Compensated.   2D echo on 03/17/2023 showed EF estimated at 55%, grade 1 diastolic dysfunction, mild LVH   Type II DM with hyperglycemia, peripheral neuropathy: Glucose up to 383.  Start insulin glargine 20 units twice daily.  NovoLog as needed for hyperglycemia.  Hemoglobin A1c was 7.8 on 03/15/2023 (improved from 11.2 on 05/22/2022).   Comorbidities include BPH, hypertension, hypothyroidism   We discussed discharge plans.  He was worried about transportation home.  Lucendia Herrlich, case manager, was consulted to set up transport for him.  Patient also said that he has an appointment at the cancer center tomorrow and he is worried that he may not have transport to make the appointment.  He requested that discharge be delayed until tomorrow so that he can be transported in a wheelchair to the cancer clinic tomorrow.       Diet Order             Diet - low sodium heart healthy                            Consultants: Cardiologist General Surgeon Oncologist Gastroenterologist  Procedures: Gastrostomy on 03/18/2023 Placement of right IJ central venous catheter with subcutaneous port on 03/18/2023 EGD on 03/15/2023    Medications:    amiodarone  200 mg Per Tube Daily   apixaban  5 mg Per Tube BID   ascorbic acid  500 mg Per Tube Daily   budesonide  0.5 mg Nebulization BID   cholecalciferol  1,000 Units Per Tube Daily   cyanocobalamin  5,000 mcg Per Tube Daily   diltiazem  75 mg Per Tube  Q6H   ezetimibe  10 mg Per Tube Daily   feeding supplement (OSMOLITE 1.5 CAL)  474 mL Per Tube TID AC & HS   ferrous sulfate  220 mg Oral Daily   free water  140 mL Per Tube TID AC & HS   gabapentin  300 mg Per Tube QHS   insulin aspart  0-15 Units Subcutaneous Q4H   insulin glargine-yfgn  20 Units Subcutaneous BID   isosorbide dinitrate  30 mg Per Tube BID   levothyroxine  25 mcg Per Tube Q0600   multivitamin with minerals  1 tablet Oral Daily   nystatin  1  Application Topical BID   mouth rinse  15 mL Mouth Rinse 4 times per day   pramipexole  2 mg Per Tube QHS   primidone  250 mg Per Tube BID   thiamine  100 mg Per Tube Daily   zinc sulfate (50mg  elemental zinc)  220 mg Per Tube Daily   Continuous Infusions:   Anti-infectives (From admission, onward)    None              Family Communication/Anticipated D/C date and plan/Code Status   DVT prophylaxis:  apixaban (ELIQUIS) tablet 5 mg     Code Status: Full Code  Family Communication: None Disposition Plan: Plan to discharge home with home PT and hospital bed   Status is: Inpatient Remains inpatient appropriate because: Dysphagia s/p PEG tube placement, esophageal cancer       Subjective:   Interval events noted.  He has no complaints.  He feels better.  Objective:    Vitals:   03/21/23 0738 03/21/23 0830 03/21/23 0933 03/21/23 1551  BP: 95/60  111/74 112/61  Pulse: 74  82 85  Resp: 17 18  17   Temp: 97.8 F (36.6 C)   97.9 F (36.6 C)  TempSrc:      SpO2: 95%   93%  Weight:      Height:       No data found.   Intake/Output Summary (Last 24 hours) at 03/21/2023 1704 Last data filed at 03/21/2023 1530 Gross per 24 hour  Intake 1288 ml  Output 1250 ml  Net 38 ml   Filed Weights   03/19/23 0500 03/20/23 0717 03/21/23 0500  Weight: 127.4 kg 127.7 kg 130.3 kg    Exam:  GEN: NAD SKIN: Warm and dry EYES: No pallor or icterus ENT: MMM CV: RRR PULM: CTA B ABD: soft, obese, NT, +BS, + PEG tube CNS: AAO x 3, non focal EXT: Bilateral BKA     Data Reviewed:   I have personally reviewed following labs and imaging studies:  Labs: Labs show the following:   Basic Metabolic Panel: Recent Labs  Lab 03/15/23 0402 03/19/23 0540 03/20/23 0538 03/21/23 0609  NA 138  --   --   --   K 3.7 3.8 4.0 4.4  CL 100  --   --   --   CO2 30  --   --   --   GLUCOSE 188*  --   --   --   BUN 27*  --   --   --   CREATININE 1.14  --   --   --    CALCIUM 8.5*  --   --   --   MG  --  1.8 2.0 2.2  PHOS  --  3.1 3.2 3.7   GFR Estimated Creatinine Clearance: 91.4 mL/min (by C-G formula based on SCr of  1.14 mg/dL). Liver Function Tests: No results for input(s): "AST", "ALT", "ALKPHOS", "BILITOT", "PROT", "ALBUMIN" in the last 168 hours.  No results for input(s): "LIPASE", "AMYLASE" in the last 168 hours. No results for input(s): "AMMONIA" in the last 168 hours. Coagulation profile No results for input(s): "INR", "PROTIME" in the last 168 hours.  CBC: Recent Labs  Lab 03/15/23 0402  WBC 6.7  HGB 11.4*  HCT 36.1*  MCV 93.8  PLT 160   Cardiac Enzymes: No results for input(s): "CKTOTAL", "CKMB", "CKMBINDEX", "TROPONINI" in the last 168 hours. BNP (last 3 results) No results for input(s): "PROBNP" in the last 8760 hours. CBG: Recent Labs  Lab 03/20/23 2353 03/21/23 0519 03/21/23 0740 03/21/23 1107 03/21/23 1658  GLUCAP 220* 183* 184* 220* 383*   D-Dimer: No results for input(s): "DDIMER" in the last 72 hours. Hgb A1c: No results for input(s): "HGBA1C" in the last 72 hours. Lipid Profile: No results for input(s): "CHOL", "HDL", "LDLCALC", "TRIG", "CHOLHDL", "LDLDIRECT" in the last 72 hours. Thyroid function studies: No results for input(s): "TSH", "T4TOTAL", "T3FREE", "THYROIDAB" in the last 72 hours.  Invalid input(s): "FREET3" Anemia work up: No results for input(s): "VITAMINB12", "FOLATE", "FERRITIN", "TIBC", "IRON", "RETICCTPCT" in the last 72 hours. Sepsis Labs: Recent Labs  Lab 03/15/23 0402  WBC 6.7    Microbiology No results found for this or any previous visit (from the past 240 hour(s)).  Procedures and diagnostic studies:  No results found.             LOS: 7 days   Lynne Righi  [facility]   Pager on [email or web address]. If 7PM-7AM, please contact night-coverage at [email or web address]     03/21/2023, 5:04 PM

## 2023-03-21 NOTE — Evaluation (Signed)
Occupational Therapy Evaluation Patient Details Name: [name]. MRN: [id number] DOB: 1955-11-18 Today's Date: 03/21/2023   History of Present Illness Pt is a 67 y/o M admitted on 03/15/23 after presenting with c/o difficulty swallowing. Pt found to have obstructing esophageal mass (adenocarcinoma at GE junction mass). Pt is s/p PEG tube placement on 03/18/23. PMH: asthma, COPD, CHF, CAD, depression, gout, DM2, HTN, dyslipidemia, PVD, OSA on CPAP   Clinical Impression   Pt was seen for OT evaluation this date. Prior to hospital admission, pt requires A with ADL's and IADL's. Pt lives with wife, who helps pt as needed. Pt presents to acute OT demonstrating impaired ADL performance and functional mobility 2/2 (See OT problem list for additional functional deficits). Upon arrival to room, pt supine in bed and RN in room giving meds, pt agreeable to tx. Pt completed all bed mobility with supervision + HOB elevated and use of rails. Pt completed a slide board t/f from bed to the recliner with MIN A. Pt did not report any pain during session. Pt reports he feels near his baseline. Pt left seated in chair with call bell within reach and RN in the room. Pt would benefit from skilled OT services to address noted impairments and functional limitations (see below for any additional details) in order to maximize safety and independence while minimizing falls risk and caregiver burden. Anticipate the need for follow up OT services upon acute hospital DC.        If plan is discharge home, recommend the following: A little help with bathing/dressing/bathroom;A little help with walking and/or transfers;Assistance with cooking/housework;Assist for transportation;Help with stairs or ramp for entrance    Functional Status Assessment  Patient has had a recent decline in their functional status and demonstrates the ability to make significant improvements in function in a reasonable and predictable amount  of time.  Equipment Recommendations  Hospital bed    Recommendations for Other Services       Precautions / Restrictions Precautions Precautions: Fall Restrictions Weight Bearing Restrictions: No RLE Weight Bearing: Weight bearing as tolerated LLE Weight Bearing: Weight bearing as tolerated      Mobility Bed Mobility Overal bed mobility: Needs Assistance Bed Mobility: Supine to Sit     Supine to sit: Supervision, HOB elevated, Used rails     General bed mobility comments: exits L side of bed Patient Response: Cooperative  Transfers Overall transfer level: Needs assistance Equipment used: Sliding board Transfers: Bed to chair/wheelchair/BSC            Lateral/Scoot Transfers: Min assist, With slide board        Balance Overall balance assessment: Needs assistance Sitting-balance support: Bilateral upper extremity supported, Feet unsupported Sitting balance-Leahy Scale: Fair                                     ADL either performed or assessed with clinical judgement   ADL Overall ADL's : Needs assistance/impaired                                     Functional mobility during ADLs: Minimal assistance General ADL Comments: Pt completed a slide board t/f from bed to the recliner with MIN A.     Vision         Perception  Praxis         Pertinent Vitals/Pain Pain Assessment Pain Assessment: No/denies pain     Extremity/Trunk Assessment Upper Extremity Assessment Upper Extremity Assessment: Generalized weakness   Lower Extremity Assessment Lower Extremity Assessment: Generalized weakness       Communication Communication Communication: No apparent difficulties   Cognition Arousal: Alert Behavior During Therapy: WFL for tasks assessed/performed Overall Cognitive Status: Within Functional Limits for tasks assessed                                       General Comments        Exercises     Shoulder Instructions      Home Living Family/patient expects to be discharged to:: Private residence Living Arrangements: Spouse/significant other Available Help at Discharge: Family;Available 24 hours/day Type of Home: House Home Access: Ramped entrance     Home Layout: One level     Bathroom Shower/Tub: Tub/shower unit;Sponge bathes at baseline   Bathroom Toilet: Handicapped height     Home Equipment: Wheelchair - manual;BSC/3in1   Additional Comments: Bariatric RW; knee scooter, hoyer, slide board      Prior Functioning/Environment Prior Level of Function : Needs assist       Physical Assist : Mobility (physical);ADLs (physical) Mobility (physical): Bed mobility;Transfers ADLs (physical): IADLs;Bathing Mobility Comments: Pt is able to complete bed mobility & bed>w/c transfers with slide board without assistance, lateral scoot w/c<>toilet without board, propel w/c, family/friends use hoyer lift to assist pt back to bed each night. Pt was attending OPPT, recently received BLE prosthesis & starting to work on standing. ADLs Comments: Wife assists in ADL's/IADL's. Reports donning socks with sock aid and working to be as independent as possible.        OT Problem List: Decreased activity tolerance      OT Treatment/Interventions: Therapeutic exercise;Therapeutic activities;Patient/family education;Self-care/ADL training;Energy conservation    OT Goals(Current goals can be found in the care plan section) Acute Rehab OT Goals Patient Stated Goal: to get better OT Goal Formulation: With patient Time For Goal Achievement: 04/04/23 Potential to Achieve Goals: Good ADL Goals Pt Will Perform Grooming: Independently;sitting Pt Will Perform Lower Body Dressing: sitting/lateral leans;bed level;with supervision Additional ADL Goal #1: Pt will be able to fully exit a flat bed with supervision.  OT Frequency: Min 1X/week    Co-evaluation               AM-PAC OT "6 Clicks" Daily Activity     Outcome Measure Help from another person eating meals?: Total (PEG) Help from another person taking care of personal grooming?: None Help from another person toileting, which includes using toliet, bedpan, or urinal?: A Lot Help from another person bathing (including washing, rinsing, drying)?: A Lot Help from another person to put on and taking off regular upper body clothing?: None Help from another person to put on and taking off regular lower body clothing?: A Lot 6 Click Score: 15   End of Session Nurse Communication: Mobility status  Activity Tolerance: Patient tolerated treatment well Patient left: in chair;with call bell/phone within reach;with chair alarm set;with nursing/sitter in room  OT Visit Diagnosis: Feeding difficulties (R63.3)                Time: 5409-8119 OT Time Calculation (min): 16 min Charges:  OT General Charges $OT Visit: 1 Visit OT Evaluation $OT Eval Moderate Complexity: 1 Mod  Jiana Lemaire  Tedd Sias, SOT

## 2023-03-21 NOTE — Plan of Care (Signed)
Making progress towards set goals

## 2023-03-21 NOTE — Consult Note (Signed)
NEW PATIENT EVALUATION  Name: [name].  MRN: [id number]  Date:   03/14/2023     DOB: [birth year]   This 67 y.o. 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  REFERRING PHYSICIAN: No ref. provider found  CHIEF COMPLAINT:  Chief Complaint  Patient presents with   Globus    DIAGNOSIS: The encounter diagnosis was Esophageal dysphagia.   PREVIOUS INVESTIGATIONS:  CT scan reviewed Pathology report reviewed Clinical notes reviewed  HPI: Patient is a 67 year old male who presented with dysphagia progressing to inability to tolerate solid foods.  He has a cyst history of CAD A-fib diabetes mellitus with peripheral vascular disease status post bilateral amputations.  He underwent up upper endoscopy showing malignant appearing intrinsic stenosis at the GE junction with a large ulcerating mass with bleeding and stigmata of recent bleeding found at the lower esophagus 40 cm from the incisors.  Mass was obstructing and circumferential.  Biopsy was positive for invasive poorly differentiated adenocarcinoma with signet ring features.  CT scans showed circumferential masslike thickening of the distal esophagus corresponding to reported area of esophageal neoplasm.  There was haziness in the paraesophageal fat which could reflect early local invasion no definitive evidence of metastatic disease was noted.  He is now seen bedside.  He has had a feeding tube placed.  PLANNED TREATMENT REGIMEN: Concurrent chemoradiation  PAST MEDICAL HISTORY:  has a past medical history of Arrhythmia, Asthma, CHF (congestive heart failure) (HCC), COPD (chronic obstructive pulmonary disease) (HCC), Coronary artery disease, Depression, Diabetes mellitus without complication (HCC), Gout, History anabolic steroid use, Hyperlipidemia, Hypertension, Hypogonadism in male, MI (myocardial infarction) (HCC), Morbid obesity (HCC), Myocardial  infarction [facility]), Peripheral vascular disease (HCC), Perirectal abscess, Pleurisy, Sleep apnea, and Varicella.    PAST SURGICAL HISTORY:  Past Surgical History:  Procedure Laterality Date   ABDOMINAL AORTIC ANEURYSM REPAIR     ACHILLES TENDON SURGERY Left 01/10/2021   Procedure: ACHILLES LENGTHENING/KIDNER;  Surgeon: Rosetta Posner, DPM;  Location: [facility];  Service: Podiatry;  Laterality: Left;   AMPUTATION Left 10/03/2021   Procedure: AMPUTATION BELOW KNEE;  Surgeon: Renford Dills, MD;  Location: [facility];  Service: Vascular;  Laterality: Left;   AMPUTATION Right 05/24/2022   Procedure: AMPUTATION BELOW KNEE;  Surgeon: Annice Needy, MD;  Location: [facility];  Service: General;  Laterality: Right;   AMPUTATION TOE Right 02/10/2016   Procedure: AMPUTATION TOE 3RD TOE;  Surgeon: Gwyneth Revels, DPM;  Location: [facility];  Service: Podiatry;  Laterality: Right;   AMPUTATION TOE Left 02/24/2020   Procedure: AMPUTATION TOE;  Surgeon: Rosetta Posner, DPM;  Location: [facility];  Service: Podiatry;  Laterality: Left;   APPLICATION OF WOUND VAC Left 02/29/2020   Procedure: APPLICATION OF WOUND VAC;  Surgeon: Rosetta Posner, DPM;  Location: [facility];  Service: Podiatry;  Laterality: Left;   BIOPSY  03/15/2023   Procedure: BIOPSY;  Surgeon: Wyline Mood, MD;  Location: [facility] ENDOSCOPY;  Service: Gastroenterology;;   CARDIAC CATHETERIZATION     CARDIOVERSION [name] 02/13/2022   Procedure: CARDIOVERSION;  Surgeon: Lamar Blinks, MD;  Location: [facility];  Service: Cardiovascular;  Laterality: [name];   COLONOSCOPY WITH PROPOFOL [name] 11/18/2015   Procedure: COLONOSCOPY WITH PROPOFOL;  Surgeon: Scot Jun, MD;  Location: [facility] ENDOSCOPY;  Service: Endoscopy;  Laterality: [name];   CORONARY ARTERY BYPASS GRAFT     CORONARY STENT INTERVENTION [name] 02/02/2020   Procedure: CORONARY STENT INTERVENTION;  Surgeon: Marcina Millard,  MD;  Location: [facility];  Service: Cardiovascular;  Laterality: [name];    ESOPHAGOGASTRODUODENOSCOPY (EGD) WITH PROPOFOL [name] 03/15/2023   Procedure: ESOPHAGOGASTRODUODENOSCOPY (EGD) WITH PROPOFOL;  Surgeon: Wyline Mood, MD;  Location: [facility] ENDOSCOPY;  Service: Gastroenterology;  Laterality: [name];   GASTROSTOMY [name] 03/18/2023   Procedure: INSERTION OF GASTROSTOMY TUBE;  Surgeon: Carolan Shiver, MD;  Location: [facility];  Service: General;  Laterality: [name];   IMPACTION REMOVAL  03/15/2023   Procedure: IMPACTION REMOVAL;  Surgeon: Wyline Mood, MD;  Location: [facility] ENDOSCOPY;  Service: Gastroenterology;;   INCISION AND DRAINAGE Left 08/07/2021   Procedure: INCISION AND DRAINAGE-Partial Calcanectomy;  Surgeon: Rosetta Posner, DPM;  Location: [facility];  Service: Podiatry;  Laterality: Left;   IRRIGATION AND DEBRIDEMENT FOOT Left 02/29/2020   Procedure: IRRIGATION AND DEBRIDEMENT FOOT;  Surgeon: Rosetta Posner, DPM;  Location: [facility];  Service: Podiatry;  Laterality: Left;   IRRIGATION AND DEBRIDEMENT FOOT Left 02/24/2020   Procedure: IRRIGATION AND DEBRIDEMENT FOOT;  Surgeon: Rosetta Posner, DPM;  Location: [facility];  Service: Podiatry;  Laterality: Left;   KNEE ARTHROSCOPY     LEFT HEART CATH AND CORS/GRAFTS ANGIOGRAPHY [name] 02/02/2020   Procedure: LEFT HEART CATH AND CORS/GRAFTS ANGIOGRAPHY;  Surgeon: Dalia Heading, MD;  Location: [facility];  Service: Cardiovascular;  Laterality: [name];   LOWER EXTREMITY ANGIOGRAPHY Left 02/25/2020   Procedure: Lower Extremity Angiography;  Surgeon: Annice Needy, MD;  Location: [facility];  Service: Cardiovascular;  Laterality: Left;   LOWER EXTREMITY ANGIOGRAPHY Left 01/04/2021   Procedure: LOWER EXTREMITY ANGIOGRAPHY;  Surgeon: Annice Needy, MD;  Location: [facility];  Service: Cardiovascular;  Laterality: Left;   LOWER EXTREMITY ANGIOGRAPHY Right 05/21/2022   Procedure: Lower Extremity Angiography;  Surgeon: Annice Needy, MD;  Location: [facility];  Service: Cardiovascular;  Laterality:  Right;   METATARSAL HEAD EXCISION Left 01/10/2021   Procedure: METATARSAL HEAD EXCISION - LEFT 5th;  Surgeon: Rosetta Posner, DPM;  Location: [facility];  Service: Podiatry;  Laterality: Left;   PERIPHERAL VASCULAR CATHETERIZATION Right 01/24/2016   Procedure: Lower Extremity Angiography;  Surgeon: Renford Dills, MD;  Location: [facility];  Service: Cardiovascular;  Laterality: Right;   PERIPHERAL VASCULAR CATHETERIZATION Right 01/25/2016   Procedure: Lower Extremity Angiography;  Surgeon: Renford Dills, MD;  Location: [facility];  Service: Cardiovascular;  Laterality: Right;   PORTACATH PLACEMENT [name] 03/18/2023   Procedure: INSERTION PORT-A-CATH;  Surgeon: Carolan Shiver, MD;  Location: [facility];  Service: General;  Laterality: [name];   TOE AMPUTATION     TONSILLECTOMY      FAMILY HISTORY: family history includes Alcohol abuse in his father; Coronary artery disease in his father; Heart failure in his brother; Hypertension in his father.  SOCIAL HISTORY:  reports that he quit smoking about 7 years ago. His smoking use included cigarettes. He started smoking about 52 years ago. He has a 22.5 pack-year smoking history. He has never used smokeless tobacco. He reports that he does not currently use alcohol after a past usage of about 3.0 standard drinks of alcohol per week. He reports that he does not use drugs.  ALLERGIES: Penicillins, Statins, and Metformin and related  MEDICATIONS:  Current Facility-Administered Medications  Medication Dose Route Frequency Provider Last Rate Last Admin   acetaminophen (TYLENOL) tablet 650 mg  650 mg Per Tube Q6H PRN Jaynie Bream, RPH   650 mg at 03/19/23 2241   Or  acetaminophen (TYLENOL) suppository 650 mg  650 mg Rectal Q6H PRN Jaynie Bream, RPH       amiodarone (PACERONE) tablet 200 mg  200 mg Per Tube Daily Jaynie Bream, RPH   200 mg at 03/21/23 1610   apixaban (ELIQUIS) tablet 5 mg  5 mg Per Tube BID  Jaynie Bream, RPH   5 mg at 03/21/23 9604   ascorbic acid (VITAMIN C) tablet 500 mg  500 mg Per Tube Daily Jaynie Bream, RPH   500 mg at 03/21/23 0916   budesonide (PULMICORT) nebulizer solution 0.5 mg  0.5 mg Nebulization BID Carolan Shiver, MD   0.5 mg at 03/21/23 0816   cholecalciferol (VITAMIN D3) 25 MCG (1000 UNIT) tablet 1,000 Units  1,000 Units Per Tube Daily Jaynie Bream, RPH   1,000 Units at 03/21/23 5409   cyanocobalamin (VITAMIN B12) tablet 5,000 mcg  5,000 mcg Per Tube Daily Jaynie Bream, RPH   5,000 mcg at 03/21/23 0915   diltiazem (CARDIZEM) tablet 75 mg  75 mg Per Tube Q6H Jaynie Bream, RPH   75 mg at 03/21/23 1331   ezetimibe (ZETIA) tablet 10 mg  10 mg Per Tube Daily Jaynie Bream, RPH   10 mg at 03/21/23 0911   feeding supplement (OSMOLITE 1.5 CAL) liquid 474 mL  474 mL Per Tube TID AC & HS Lurene Shadow, MD   474 mL at 03/21/23 1334   ferrous sulfate 220 (44 Fe) MG/5ML solution 220 mg  220 mg Oral Daily Jaynie Bream, RPH   220 mg at 03/21/23 0910   free water 140 mL  140 mL Per Tube TID AC & HS Lurene Shadow, MD   140 mL at 03/21/23 1335   furosemide (LASIX) tablet 20 mg  20 mg Oral BID PRN Carolan Shiver, MD       gabapentin (NEURONTIN) capsule 300 mg  300 mg Per Tube QHS Jaynie Bream, RPH   300 mg at 03/20/23 2103   insulin aspart (novoLOG) injection 0-15 Units  0-15 Units Subcutaneous Q4H Carolan Shiver, MD   5 Units at 03/21/23 1334   isosorbide dinitrate (ISORDIL) tablet 30 mg  30 mg Per Tube BID Jaynie Bream, RPH   30 mg at 03/20/23 2200   levothyroxine (SYNTHROID) tablet 25 mcg  25 mcg Per Tube Q0600 Jaynie Bream, RPH   25 mcg at 03/21/23 0515   magnesium hydroxide (MILK OF MAGNESIA) suspension 30 mL  30 mL Oral Daily PRN Carolan Shiver, MD       morphine (PF) 2 MG/ML injection 1 mg  1 mg Intravenous Q6H PRN Enedina Finner, MD   1 mg at 03/19/23 2024   multivitamin with minerals tablet 1  tablet  1 tablet Oral Daily Carolan Shiver, MD   1 tablet at 03/21/23 0915   nitroGLYCERIN (NITROSTAT) SL tablet 0.4 mg  0.4 mg Sublingual Q5 min PRN Carolan Shiver, MD       nystatin (MYCOSTATIN/NYSTOP) topical powder 1 Application  1 Application Topical BID Carolan Shiver, MD   1 Application at 03/21/23 0910   ondansetron (ZOFRAN) tablet 4 mg  4 mg Per Tube Q6H PRN Jaynie Bream, RPH   4 mg at 03/20/23 1636   Or   ondansetron (ZOFRAN) injection 4 mg  4 mg Intravenous Q6H PRN Jaynie Bream, [facility]       Oral care mouth rinse  15 mL Mouth Rinse 4 times per day Enedina Finner, MD  15 mL at 03/21/23 1610   Oral care mouth rinse  15 mL Mouth Rinse PRN Enedina Finner, MD       oxyCODONE (Oxy IR/ROXICODONE) immediate release tablet 5 mg  5 mg Per Tube Q6H PRN Jaynie Bream, RPH   5 mg at 03/21/23 9604   pramipexole (MIRAPEX) tablet 2 mg  2 mg Per Tube QHS Jaynie Bream, RPH   2 mg at 03/20/23 2103   primidone (MYSOLINE) tablet 250 mg  250 mg Per Tube BID Jaynie Bream, RPH   250 mg at 03/21/23 5409   thiamine (VITAMIN B1) tablet 100 mg  100 mg Per Tube Daily Enedina Finner, MD   100 mg at 03/21/23 0915   traZODone (DESYREL) tablet 25 mg  25 mg Per Tube QHS PRN Jaynie Bream, RPH   25 mg at 03/19/23 2241   zinc sulfate (50mg  elemental zinc) capsule 220 mg  220 mg Per Tube Daily Jaynie Bream, RPH   220 mg at 03/21/23 0915    ECOG PERFORMANCE STATUS:  2 - Symptomatic, <50% confined to bed  REVIEW OF SYSTEMS: Patient has history of peripheral vascular disease diabetes mellitus bilateral lower extremity amputation Patient denies any weight loss, fatigue, weakness, fever, chills or night sweats. Patient denies any loss of vision, blurred vision. Patient denies any ringing  of the ears or hearing loss. No irregular heartbeat. Patient denies heart murmur or history of fainting. Patient denies any chest pain or pain radiating to her upper extremities. Patient denies  any shortness of breath, difficulty breathing at night, cough or hemoptysis. Patient denies any swelling in the lower legs. Patient denies any nausea vomiting, vomiting of blood, or coffee ground material in the vomitus. Patient denies any stomach pain. Patient states has had normal bowel movements no significant constipation or diarrhea. Patient denies any dysuria, hematuria or significant nocturia. Patient denies any problems walking, swelling in the joints or loss of balance. Patient denies any skin changes, loss of hair or loss of weight. Patient denies any excessive worrying or anxiety or significant depression. Patient denies any problems with insomnia. Patient denies excessive thirst, polyuria, polydipsia. Patient denies any swollen glands, patient denies easy bruising or easy bleeding. Patient denies any recent infections, allergies or URI. Patient "s visual fields have not changed significantly in recent time.   PHYSICAL EXAM: BP 111/74   Pulse 82   Temp 97.8 F (36.6 C)   Resp 18   Ht 6\' 4"  (1.93 m) Comment: s/p bilateral BKA  Wt 287 lb 4.2 oz (130.3 kg)   SpO2 95%   BMI 34.97 kg/m  Patient is status post below-knee amputations bilaterally.  Cardiac examination essentially unremarkable abdomen is benign he does have a feeding tube placed which is functioning well.  Lungs are clear to AMP.  LABORATORY DATA: Pathology reports reviewed    RADIOLOGY RESULTS: CT scan reviewed PET CT scan has been ordered   IMPRESSION: Probably locally advanced adenocarcinoma the GE junction in 67 year old male  PLAN: At this time elect to go ahead with concurrent chemoradiation therapy.  I like to see his PET CT scan findings prior to initiating to treatment.  This will to better target his area of tumor involvement.  Tentatively I would schedule 50 Gray over 5 weeks with concurrent chemotherapy.  There will be extra effort by both professional staff as well as technical staff to coordinate and manage  concurrent chemoradiation and ensuing side effects during his treatments. I would use  IMRT treatment planning and delivery to spare critical structures such as his heart normal lung volume spinal cord.  Risks and benefits of treatment occluding possible radiation esophagitis fatigue alteration of blood counts skin reaction possible nausea all were described in detail with the patient.  I have set him up for simulation in about a week and a half's time to allow for his PET scan to be completed and evaluated.  Patient comprehends my recommendations well.  I would like to take this opportunity to thank you for allowing me to participate in the care of your patient.Carmina Miller, MD

## 2023-03-22 ENCOUNTER — Telehealth: Payer: Self-pay | Admitting: *Deleted

## 2023-03-22 ENCOUNTER — Inpatient Hospital Stay: Payer: Medicare Other | Attending: Internal Medicine

## 2023-03-22 ENCOUNTER — Telehealth: Payer: Self-pay | Admitting: Internal Medicine

## 2023-03-22 DIAGNOSIS — R1319 Other dysphagia: Secondary | ICD-10-CM | POA: Diagnosis not present

## 2023-03-22 DIAGNOSIS — C159 Malignant neoplasm of esophagus, unspecified: Secondary | ICD-10-CM | POA: Diagnosis not present

## 2023-03-22 LAB — GLUCOSE, CAPILLARY
Glucose-Capillary: 225 mg/dL — ABNORMAL HIGH (ref 70–99)
Glucose-Capillary: 121 mg/dL — ABNORMAL HIGH (ref 70–99)
Glucose-Capillary: 86 mg/dL (ref 70–99)
Glucose-Capillary: 244 mg/dL — ABNORMAL HIGH (ref 70–99)

## 2023-03-22 LAB — MAGNESIUM: Magnesium: 2.3 mg/dL (ref 1.7–2.4)

## 2023-03-22 LAB — POTASSIUM: Potassium: 4.7 mmol/L (ref 3.5–5.1)

## 2023-03-22 LAB — PHOSPHORUS: Phosphorus: 2.9 mg/dL (ref 2.5–4.6)

## 2023-03-22 MED ORDER — AMIODARONE HCL 200 MG PO TABS: 200.0000 mg | ORAL_TABLET | Freq: Every day | ORAL | Status: DC

## 2023-03-22 MED ORDER — GABAPENTIN 300 MG PO CAPS: 300.0000 mg | ORAL_CAPSULE | Freq: Every day | ORAL | Status: DC

## 2023-03-22 MED ORDER — CLONAZEPAM 0.5 MG PO TABS: 0.5000 mg | ORAL_TABLET | Freq: Two times a day (BID) | ORAL | Status: DC

## 2023-03-22 MED ORDER — FERROUS SULFATE 300 (60 FE) MG/5ML PO SOLN: 300.0000 mg | Freq: Every day | ORAL | 0 refills | Status: DC

## 2023-03-22 MED ORDER — APIXABAN 5 MG PO TABS: 5.0000 mg | ORAL_TABLET | Freq: Two times a day (BID) | ORAL | Status: DC

## 2023-03-22 MED ORDER — OSMOLITE 1.5 CAL PO LIQD: 474.0000 mL | Freq: Three times a day (TID) | ORAL | Status: DC

## 2023-03-22 MED ORDER — CHOLECALCIFEROL 25 MCG (1000 UT) PO TABS: 1000.0000 [IU] | ORAL_TABLET | Freq: Every day | ORAL | Status: DC

## 2023-03-22 MED ORDER — VITAMIN B-12 5000 MCG PO TBDP: 10000.0000 ug | ORAL_TABLET | Freq: Every day | ORAL | Status: DC

## 2023-03-22 MED ORDER — ONDANSETRON HCL 4 MG PO TABS: 4.0000 mg | ORAL_TABLET | Freq: Four times a day (QID) | ORAL | Status: DC | PRN

## 2023-03-22 MED ORDER — ASPIRIN 81 MG PO CHEW: 81.0000 mg | CHEWABLE_TABLET | Freq: Every day | ORAL | Status: DC

## 2023-03-22 MED ORDER — ACETAMINOPHEN 325 MG PO TABS: 650.0000 mg | ORAL_TABLET | Freq: Four times a day (QID) | ORAL | Status: DC | PRN

## 2023-03-22 MED ORDER — LEVOTHYROXINE SODIUM 25 MCG PO TABS: 25.0000 ug | ORAL_TABLET | Freq: Every day | ORAL | Status: DC

## 2023-03-22 MED ORDER — FUROSEMIDE 20 MG PO TABS: ORAL_TABLET | ORAL | Status: DC

## 2023-03-22 MED ORDER — NITROFURANTOIN MONOHYD MACRO 100 MG PO CAPS: 100.0000 mg | ORAL_CAPSULE | Freq: Every day | ORAL | Status: DC

## 2023-03-22 MED ORDER — PRAMIPEXOLE DIHYDROCHLORIDE 1 MG PO TABS: 2.0000 mg | ORAL_TABLET | Freq: Every evening | ORAL | Status: DC

## 2023-03-22 MED ORDER — VITAMIN C 500 MG PO TABS: 500.0000 mg | ORAL_TABLET | Freq: Every day | ORAL | Status: DC

## 2023-03-22 MED ORDER — INSULIN GLARGINE 100 UNIT/ML ~~LOC~~ SOLN: 20.0000 [IU] | Freq: Two times a day (BID) | SUBCUTANEOUS | Status: DC

## 2023-03-22 MED ORDER — JUVEN PO PACK: 1.0000 | PACK | Freq: Two times a day (BID) | ORAL | Status: DC

## 2023-03-22 MED ORDER — EZETIMIBE 10 MG PO TABS: 10.0000 mg | ORAL_TABLET | Freq: Every evening | ORAL | Status: DC

## 2023-03-22 MED ORDER — PRIMIDONE 250 MG PO TABS: 250.0000 mg | ORAL_TABLET | Freq: Two times a day (BID) | ORAL | Status: DC

## 2023-03-22 MED ORDER — ZINC SULFATE 220 (50 ZN) MG PO CAPS: 220.0000 mg | ORAL_CAPSULE | Freq: Every day | ORAL | Status: DC

## 2023-03-22 NOTE — Progress Notes (Signed)
[employer]   DOB:[birth year]   ZO#:[id number]    Subjective: Patient status post -PEG tube placement /Mediport placement - on 11/18.  Tolerated the procedures well.  patient resting in the bed. Complains of mild incisional pain.  Tube feeds in progress.  Objective:  Vitals:   03/22/23 0729 03/22/23 0754  BP:  107/65  Pulse:  69  Resp:  16  Temp:  98.3 F (36.8 C)  SpO2: 95% 93%     Intake/Output Summary (Last 24 hours) at 03/22/2023 1253 Last data filed at 03/21/2023 1836 Gross per 24 hour  Intake 1228 ml  Output 1200 ml  Net 28 ml   Bilateral BKA.  Physical Exam Vitals and nursing note reviewed.  HENT:     Head: Normocephalic and atraumatic.     Mouth/Throat:     Pharynx: Oropharynx is clear.  Eyes:     Extraocular Movements: Extraocular movements intact.     Pupils: Pupils are equal, round, and reactive to light.  Cardiovascular:     Rate and Rhythm: Normal rate and regular rhythm.  Pulmonary:     Comments: Decreased breath sounds bilaterally.  Abdominal:     Palpations: Abdomen is soft.  Musculoskeletal:        General: Normal range of motion.     Cervical back: Normal range of motion.  Skin:    General: Skin is warm.  Neurological:     General: No focal deficit present.     Mental Status: He is alert and oriented to person, place, and time.  Psychiatric:        Behavior: Behavior normal.        Judgment: Judgment normal.      Labs:  Lab Results  Component Value Date   WBC 6.7 03/15/2023   HGB 11.4 (L) 03/15/2023   HCT 36.1 (L) 03/15/2023   MCV 93.8 03/15/2023   PLT 160 03/15/2023   NEUTROABS 4.7 03/14/2023    Lab Results  Component Value Date   NA 138 03/15/2023   K 4.7 03/22/2023   CL 100 03/15/2023   CO2 30 03/15/2023    Studies:  No results found.  67 y.o. male patient with a history of smoking  CAD' A.fib  DM/peripheral vascular disease status post bilateral amputations currently admitted to hospital for dysphagia noted to  have Esophagus mass.    # Adenocarcinoma GE junction mass--status post GI evaluation/EGD.  Pathology - adenocarcinoma of gastroesophageal cancer. CT scan chest and pelvis-lower esophagus mass noted-possible involvement of the paraesophageal fat/concerning for early invasion of the mediastinum- clinically suggestive of T4 disease/stage IV.  Clinically not resectable. Otherwise no pathologic lymph node noted or evidence of distant metastatic disease. .  Patient would benefit from chemoradiation for local control.  Discussed with patient that unfortunately his disease is unlikely to be curable. Recommend carbo-taxol weekly- along with radiation daily.  Status post radiation oncology evaluation.  Awaiting simulation today. Patient is interested in proceeding with treatment after discharge from the hospital. Will plan out patient PET scan.  Stable.    # mild Anemia: Likely due to GI bleed/secondary to #1-Stable.    # CAD/A.fib -on eliquis. [Dr.Arida]-Stable.   # DM- [O conell]Hb A1c- 7.2. Stable.    # Weight loss/malnutrition; s/p  tube placement [Dr. Cintron]- Stable.    # ECOG performance status:2.  bil BKA- [sec to DM/ PVD; June 2023]- in wheel chair. Patient needs transportation/[facility].  Patient can be discharged from oncology standpoint outpatient appointments made.  [name], MD 03/22/2023  12:53 PM

## 2023-03-22 NOTE — Care Management Important Message (Signed)
Important Message  Patient Details  Name: [name]. MRN: [id number] Date of Birth: 01/05/1956   Important Message Given:  Yes - Medicare IM     Verita Schneiders Secily Walthour 03/22/2023, 10:17 AM

## 2023-03-22 NOTE — Progress Notes (Signed)
As per the request I spoke to patient's wife regarding her concerns regarding physical therapy.   Recommend follow-up as planned.   GB

## 2023-03-22 NOTE — Plan of Care (Signed)
Progressing towards set goal

## 2023-03-22 NOTE — Telephone Encounter (Signed)
Patient wife called asking for Dr B and Dr Rushie Chestnut to call her as she has questions regarding his treatment and she keeps missing doctor when they round. Please return her call.[phone]

## 2023-03-22 NOTE — Progress Notes (Addendum)
Brief Nutrition Follow-Up Note  Received secure chat from RN regarding TF education. Wife is requesting education prior to discharge today. Per TOC, home TF discharge needs have been arranged through [employer] and representative Jeri Modena will be meeting with pt and wife prior to discharge for home TF education.   Addendum: Per TOC, [employer] is out of network and plan to have [employer] service home TF needs. Case discussed with RN, who will provide TF education to pt and family. Per TOC, [employer] has requested 5 day supple of TF- RD delivered to room. Also helped TOC fill out form with necessary documentation for TF supplies and formula. Pt to discharge home tomorrow.   INTERVENTION:    -Bolus feedings via g-tube:    474 ml (2 cartons) Osmolite 1.5 4 times daily (8 cartons total daily)   70 ml free water flush before and after each feeding administration   Tube feeding regimen provides 2480 kcal (100% of needs), 119 grams of protein, and 1448 ml of H2O. Total free water: 2008 ml daily  Levada Schilling, RD, LDN, CDCES Registered Dietitian III Certified Diabetes Care and Education Specialist Please refer to [facility] for RD and/or RD on-call/weekend/after hours pager

## 2023-03-22 NOTE — Discharge Summary (Signed)
Physician Discharge Summary   Patient: [name]. MRN: [id number] DOB: [birth year]  Admit date:     03/14/2023  Discharge date: 03/24/23  Discharge Physician: Lurene Shadow   PCP: [name], MD   Recommendations at discharge:   Follow-up with PCP in 1 week Follow-up with oncologist and radiation oncologist as scheduled  Discharge Diagnoses: Principal Problem:   Dysphagia Active Problems:   AKI (acute kidney injury) (HCC)   Paroxysmal atrial fibrillation (HCC)   Hypothyroidism   Essential hypertension   PAD (peripheral artery disease) (HCC)   Chronic diastolic CHF (congestive heart failure) (HCC)   Atrial fibrillation, chronic (HCC)   Type 2 diabetes mellitus with peripheral neuropathy (HCC)   BPH (benign prostatic hyperplasia)   Coronary artery disease   Esophageal adenocarcinoma (HCC)  Resolved Problems:   * No resolved hospital problems. [facility] Course:  [name]. is a 67 y.o. male  with medical history significant for asthma, COPD, chronic diastolic CHF, coronary artery disease, depression, gout, type 2 diabetes mellitus, hypertension, dyslipidemia, PVD s/p bilateral BKA, OSA on CPAP, who presented to the hospital because of difficulty swallowing (both solids and liquids) that has been going on for several weeks.      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 Continue enteral nutrition via gastrostomy tube. Case manager to set up home supplies. Plan for PET scan as an outpatient. Outpatient follow-up with Dr. Donneta Romberg, oncologist and Dr. Carmie Kanner, radiation oncologist.     AKI: Improved     Paroxysmal atrial fibrillation: Continue Eliquis, amiodarone and diltiazem     Hypotension: BP has been running low.  Isosorbide mononitrate and losartan have been discontinued.  Outpatient follow-up for close monitoring of blood pressure.   Antihypertensives can be adjusted accordingly.     CAD s/p CABG in 2006, PAD s/p bilateral BKA     Chronic diastolic CHF: Compensated.   2D echo on 03/17/2023 showed EF estimated at 55%, grade 1 diastolic dysfunction, mild LVH     Type II DM with hyperglycemia, peripheral neuropathy:   He takes Lantus 40 units bid and Novolog SSI at home. Recommend Lantus 20 units twice daily since he's requiring less insulin in the hospital Dose can be titrated upwards by his PCP depending on blood glucose levels. Hemoglobin A1c was 7.8 on 03/15/2023 (improved from 11.2 on 05/22/2022).     Comorbidities include BPH, hypertension, hypothyroidism Of note, finasteride and tamsulosin could not be prescribed because they cannot be given via gastrostomy tube.  Patient said it will not make any difference to him.   His condition has improved and he is deemed stable for discharge to home.       Consultants: Cardiologist, general surgeon, oncologist, gastroenterologist, radiation oncologist Procedures performed:   Gastrostomy on 03/18/2023 Placement of right IJ central venous catheter with subcutaneous port on 03/18/2023 EGD on 03/15/2023 Disposition: Home Diet recommendation:  Discharge Diet Orders (From admission, onward)     Start     Ordered   03/21/23 0000  Diet - low sodium heart healthy        03/21/23 1335           Enteral nutrition via gastrostomy tube DISCHARGE MEDICATION: Allergies as of 03/22/2023       Reactions   Penicillins Other (See Comments)   Happened at 67 years old and pt. stated he passed out  He has tolerated amoxicillin/clavulanate and ampicillin/sulbactam  Statins    Other reaction(s): Muscle Pain Causes legs to ache per pt   Metformin And Related Diarrhea        Medication List     STOP taking these medications    aspirin EC 81 MG tablet Replaced by: aspirin 81 MG chewable tablet   diltiazem 300 MG 24 hr capsule Commonly known as: CARDIZEM CD    finasteride 5 MG tablet Commonly known as: PROSCAR   isosorbide mononitrate 60 MG 24 hr tablet Commonly known as: IMDUR   losartan 25 MG tablet Commonly known as: COZAAR   risperiDONE 0.5 MG tablet Commonly known as: RISPERDAL   sulfamethoxazole-trimethoprim 800-160 MG tablet Commonly known as: BACTRIM DS   tamsulosin 0.4 MG Caps capsule Commonly known as: FLOMAX       TAKE these medications    acetaminophen 325 MG tablet Commonly known as: TYLENOL Place 2 tablets (650 mg total) into feeding tube every 6 (six) hours as needed for mild pain (pain score 1-3) (or Fever >/= 101). What changed: how to take this   amiodarone 200 MG tablet Commonly known as: PACERONE Place 1 tablet (200 mg total) into feeding tube daily. What changed: how to take this   apixaban 5 MG Tabs tablet Commonly known as: Eliquis Place 1 tablet (5 mg total) into feeding tube 2 (two) times daily. What changed: how to take this   ascorbic acid 500 MG tablet Commonly known as: VITAMIN C Place 1 tablet (500 mg total) into feeding tube daily. What changed: how to take this   aspirin 81 MG chewable tablet Commonly known as: Aspirin Childrens Place 1 tablet (81 mg total) into feeding tube daily. Replaces: aspirin EC 81 MG tablet   budesonide 0.5 MG/2ML nebulizer solution Commonly known as: PULMICORT Take 2 mLs (0.5 mg total) by nebulization 2 (two) times daily.   Cholecalciferol 25 MCG (1000 UT) tablet Place 1 tablet (1,000 Units total) into feeding tube daily. What changed: how to take this   clonazePAM 0.5 MG tablet Commonly known as: KLONOPIN Place 1 tablet (0.5 mg total) into feeding tube 2 (two) times daily. What changed: how to take this   Dermacloud Oint Apply 1 Application topically daily as needed.   diltiazem 30 MG tablet Commonly known as: CARDIZEM Place 2.5 tablets (75 mg total) into feeding tube 4 (four) times daily.   ezetimibe 10 MG tablet Commonly known as:  ZETIA Place 1 tablet (10 mg total) into feeding tube at bedtime. What changed: how to take this   feeding supplement (OSMOLITE 1.5 CAL) Liqd Place 474 mLs into feeding tube 4 (four) times daily -  before meals and at bedtime. What changed: You were already taking a medication with the same name, and this prescription was added. Make sure you understand how and when to take each.   nutrition supplement (JUVEN) Pack Place 1 packet into feeding tube 2 (two) times daily between meals. What changed: how to take this   ferrous sulfate 325 (65 FE) MG tablet Take 325 mg by mouth daily with breakfast. What changed: Another medication with the same name was added. Make sure you understand how and when to take each.   ferrous sulfate 300 (60 Fe) MG/5ML syrup Place 5 mLs (300 mg total) into feeding tube daily. What changed: You were already taking a medication with the same name, and this prescription was added. Make sure you understand how and when to take each.   FreeStyle [employer] 3 Sensor Misc  FreeStyle Libre 3 Sensor Misc Use 1 sensor every 14 (fourteen) days.   furosemide 20 MG tablet Commonly known as: LASIX Take 1 tablet (20 mg total) by mouth ONCE OR TWICE daily (total daily dose maximum 40 mg)  via feeding tube as needed for increased swelling, shortness of breath, weight gain 5+ lbs over 1-2 days. Seek medical care if these symptoms are not improving with increased dose, or if you become dizzy or have low blood pressure. What changed: additional instructions   gabapentin 300 MG capsule Commonly known as: NEURONTIN Place 1 capsule (300 mg total) into feeding tube at bedtime. What changed: how to take this   HumaLOG 100 UNIT/ML injection Generic drug: insulin lispro Inject 15 Units into the skin 3 (three) times daily with meals. >200   insulin glargine 100 UNIT/ML injection Commonly known as: LANTUS Inject 0.2 mLs (20 Units total) into the skin 2 (two) times daily. Inject 15  units into the skin daily in AM and inject 25 units into the skin daily in PM What changed:  how much to take how to take this when to take this   levothyroxine 25 MCG tablet Commonly known as: SYNTHROID Place 1 tablet (25 mcg total) into feeding tube daily at 6 (six) AM. What changed: how to take this   lidocaine-prilocaine cream Commonly known as: EMLA Apply on the port. 30 -45 min  prior to port access.   multivitamin with minerals tablet Take 1 tablet by mouth daily.   mupirocin ointment 2 % Commonly known as: BACTROBAN Apply 1 Application topically daily.   nitrofurantoin (macrocrystal-monohydrate) 100 MG capsule Commonly known as: MACROBID Place 1 capsule (100 mg total) into feeding tube daily. What changed: how to take this   nitroGLYCERIN 0.4 MG SL tablet Commonly known as: NITROSTAT Place 1 tablet (0.4 mg total) under the tongue every 5 (five) minutes as needed for chest pain.   nystatin powder Commonly known as: MYCOSTATIN/NYSTOP Apply 1 Application topically 2 (two) times daily.   ondansetron 4 MG tablet Commonly known as: ZOFRAN Place 1 tablet (4 mg total) into feeding tube every 6 (six) hours as needed for nausea. What changed: how to take this   pramipexole 1 MG tablet Commonly known as: MIRAPEX Place 2 tablets (2 mg total) into feeding tube at bedtime. What changed: how to take this   primidone 250 MG tablet Commonly known as: MYSOLINE Place 1 tablet (250 mg total) into feeding tube 2 (two) times daily. What changed: how to take this   Vitamin B-12 5000 MCG Tbdp Place 10,000 mcg into feeding tube daily. What changed: how to take this   zinc sulfate (50mg  elemental zinc) 220 (50 Zn) MG capsule Place 1 capsule (220 mg total) into feeding tube daily. What changed: how to take this        Follow-up Information     [name], MD. Schedule an appointment as soon as possible for a visit on 03/28/2023.   Specialties: Internal  Medicine, Oncology Why: Office will call to update and schedule this appointment Contact information: [address] [place] [address] [phone]                Discharge Exam: Ceasar Mons Weights   03/19/23 0500 03/20/23 0717 03/21/23 0500  Weight: 127.4 kg 127.7 kg 130.3 kg   GEN: NAD SKIN: Warm and dry EYES: No pallor or icterus ENT: MMM CV: RRR PULM: CTA B ABD: soft, ND, NT, +BS CNS: AAO x 3,  non focal EXT: No edema or tenderness.  Bilateral AKA   Condition at discharge: good  The results of significant diagnostics from this hospitalization (including imaging, microbiology, ancillary and laboratory) are listed below for reference.   Imaging Studies: DG C-Arm 1-60 Min-No Report  Result Date: 03/18/2023 Fluoroscopy was utilized by the requesting physician.  No radiographic interpretation.   ECHOCARDIOGRAM COMPLETE  Result Date: 03/17/2023    ECHOCARDIOGRAM REPORT   Patient Name:   [name]. Date of Exam: 03/17/2023 Medical Rec #:  [id number]             Height:       76.0 in Accession #:    [id number]            Weight:       297.6 lb Date of Birth:  09/27/1955            BSA:          2.623 m Patient Age:    67 years              BP:           138/73 mmHg Patient Gender: M                     HR:           76 bpm. Exam Location:  [facility] Procedure: 2D Echo and Strain Analysis Indications:     CAD Native Vessel I25.10  History:         Patient has prior history of Echocardiogram examinations, most                  recent 10/05/2021.  Sonographer:     Overton Mam RDCS, FASE Referring Phys:  [id number] Antonieta Iba Diagnosing Phys: Julien Nordmann MD  Sonographer Comments: Global longitudinal strain was attempted. IMPRESSIONS  1. Left ventricular ejection fraction, by estimation, is 55 %. The left ventricle has normal function. The left ventricle demonstrates regional wall motion abnormalities (mild hypokinesis of the basal inferior wall). There is mild left  ventricular hypertrophy. Left ventricular diastolic parameters are consistent with Grade I diastolic dysfunction (impaired relaxation). The average left ventricular global longitudinal strain is -12.0 %.  2. Right ventricular systolic function is normal. The right ventricular size is normal. Tricuspid regurgitation signal is inadequate for assessing PA pressure.  3. The mitral valve is normal in structure. No evidence of mitral valve regurgitation. No evidence of mitral stenosis.  4. The aortic valve is tricuspid. There is mild calcification of the aortic valve. Aortic valve regurgitation is not visualized. Aortic valve sclerosis/calcification is present, without any evidence of aortic stenosis.  5. The inferior vena cava is normal in size with greater than 50% respiratory variability, suggesting right atrial pressure of 3 mmHg. FINDINGS  Left Ventricle: Left ventricular ejection fraction, by estimation, is 55 %. Left ventricular ejection fraction by PLAX is 57 %. The left ventricle has normal function. The left ventricle demonstrates regional wall motion abnormalities. The average left ventricular global longitudinal strain is -12.0 %. The left ventricular internal cavity size was normal in size. There is mild left ventricular hypertrophy. Left ventricular diastolic parameters are consistent with Grade I diastolic dysfunction (impaired  relaxation). Right Ventricle: The right ventricular size is normal. No increase in right ventricular wall thickness. Right ventricular systolic function is normal. Tricuspid regurgitation signal is inadequate for assessing PA pressure. Left Atrium: Left atrial size was normal in size. Right Atrium:  Right atrial size was normal in size. Pericardium: There is no evidence of pericardial effusion. Mitral Valve: The mitral valve is normal in structure. No evidence of mitral valve regurgitation. No evidence of mitral valve stenosis. Tricuspid Valve: The tricuspid valve is normal in  structure. Tricuspid valve regurgitation is mild . No evidence of tricuspid stenosis. Aortic Valve: The aortic valve is tricuspid. There is mild calcification of the aortic valve. Aortic valve regurgitation is not visualized. Aortic valve sclerosis/calcification is present, without any evidence of aortic stenosis. Aortic valve peak gradient measures 11.3 mmHg. Pulmonic Valve: The pulmonic valve was normal in structure. Pulmonic valve regurgitation is not visualized. No evidence of pulmonic stenosis. Aorta: The aortic root is normal in size and structure. Venous: The inferior vena cava is normal in size with greater than 50% respiratory variability, suggesting right atrial pressure of 3 mmHg. IAS/Shunts: No atrial level shunt detected by color flow Doppler.  LEFT VENTRICLE PLAX 2D LV EF:         Left            Diastology                ventricular     LV e' medial:    6.64 cm/s                ejection        LV E/e' medial:  11.2                fraction by     LV e' lateral:   9.79 cm/s                PLAX is 57      LV E/e' lateral: 7.6                %. LVIDd:         5.30 cm         2D LVIDs:         3.70 cm         Longitudinal LV PW:         1.40 cm         Strain LV IVS:        1.40 cm         2D Strain GLS  -12.0 % LVOT diam:     2.35 cm         Avg: LV SV:         83 LV SV Index:   32 LVOT Area:     4.34 cm  RIGHT VENTRICLE RV Basal diam:  3.80 cm RV S prime:     9.57 cm/s TAPSE (M-mode): 1.8 cm LEFT ATRIUM             Index        RIGHT ATRIUM           Index LA diam:        3.80 cm 1.45 cm/m   RA Area:     19.30 cm LA Vol (A2C):   64.8 ml 24.71 ml/m  RA Volume:   56.30 ml  21.46 ml/m LA Vol (A4C):   46.0 ml 17.54 ml/m LA Biplane Vol: 59.6 ml 22.72 ml/m  AORTIC VALVE                 PULMONIC VALVE AV Area (Vmax): 2.27 cm     PV Vmax:        0.87 m/s AV Vmax:  168.00 cm/s  PV Peak grad:   3.0 mmHg AV Peak Grad:   11.3 mmHg    RVOT Peak grad: 4 mmHg LVOT Vmax:      87.80 cm/s LVOT Vmean:      58.600 cm/s LVOT VTI:       0.192 m  AORTA Ao Root diam: 3.70 cm Ao Asc diam:  3.10 cm MITRAL VALVE MV Area (PHT): 3.54 cm    SHUNTS MV Decel Time: 214 msec    Systemic VTI:  0.19 m MV E velocity: 74.40 cm/s  Systemic Diam: 2.35 cm MV A velocity: 97.50 cm/s MV E/A ratio:  0.76 Julien Nordmann MD Electronically signed by Julien Nordmann MD Signature Date/Time: 03/17/2023/2:49:50 PM    Final    CT CHEST ABDOMEN PELVIS W CONTRAST  Result Date: 03/16/2023 CLINICAL DATA:  67 year old male with history of esophageal cancer. * Tracking Code: BO * EXAM: CT CHEST, ABDOMEN, AND PELVIS WITH CONTRAST TECHNIQUE: Multidetector CT imaging of the chest, abdomen and pelvis was performed following the standard protocol during bolus administration of intravenous contrast. RADIATION DOSE REDUCTION: This exam was performed according to the departmental dose-optimization program which includes automated exposure control, adjustment of the mA and/or kV according to patient size and/or use of iterative reconstruction technique. CONTRAST:  OMNIPAQUE IOHEXOL 300 MG/ML  SOLN COMPARISON:  CT of the abdomen and pelvis 08/13/2022. Chest CTA 09/10/2013. FINDINGS: CT CHEST FINDINGS Cardiovascular: Heart size is normal. There is no significant pericardial fluid, thickening or pericardial calcification. Atherosclerotic calcifications are noted in the thoracic aorta as well as the left anterior descending, left circumflex and right coronary arteries. Status post median sternotomy for CABG including LIMA to the LAD. Mediastinum/Nodes: No pathologically enlarged mediastinal or hilar lymph nodes. Extensive thickening of the distal esophagus measuring up to 1.7 cm in thickness shortly before the gastroesophageal junction where there is also haziness in the paraesophageal fat of the middle mediastinum, concerning for potential esophageal neoplasm. No axillary lymphadenopathy. Lungs/Pleura: No acute consolidative airspace disease. No pleural  effusions. No definite suspicious appearing pulmonary nodules or masses are noted. Scattered areas of mild scarring are noted in the lung bases bilaterally. Musculoskeletal: Median sternotomy wires. There are no aggressive appearing lytic or blastic lesions noted in the visualized portions of the skeleton. CT ABDOMEN PELVIS FINDINGS Hepatobiliary: No suspicious cystic or solid hepatic lesions. No intra or extrahepatic biliary ductal dilatation. Gallbladder is unremarkable in appearance. Pancreas: No pancreatic mass. No pancreatic ductal dilatation. No pancreatic or peripancreatic fluid collections or inflammatory changes. Spleen: Unremarkable. Adrenals/Urinary Tract: Exophytic 1.7 cm low-attenuation lesion in the posterior aspect of the interpolar region of the left kidney, compatible with a simple cyst. Centrally low-attenuation peripherally thick walled right-sided perinephric fluid collection (axial image 84 of series 2) measuring 5.0 x 3.5 cm, tracking caudally along the right retroperitoneum anterior to the iliopsoas musculature, likely a chronic and unresolved hematoma, although abscess is not entirely excluded. The inferior aspect of this appears larger than prior studies (axial image 91 of series 2) measuring up to 6.6 x 2.9 cm just below the lower pole of the right kidney. No hydroureteronephrosis. Urinary bladder is unremarkable in appearance. Mild nodular thickening of the adrenal glands bilaterally, stable compared to the prior study and nonspecific. Stomach/Bowel: Stomach is nearly completely decompressed, but otherwise unremarkable in appearance. No pathologic dilatation of small bowel or colon. Numerous colonic diverticula are noted, without surrounding inflammatory changes to suggest an acute diverticulitis at this time. Normal appendix. Vascular/Lymphatic: Atherosclerosis in the  abdominal aorta and pelvic vasculature, without evidence of aneurysm or dissection in the abdominal or pelvic vasculature.  Multiple prominent borderline enlarged retroperitoneal lymph nodes, nonspecific. No definite pathologically enlarged lymph nodes in the abdomen or pelvis. Reproductive: Prostate gland and seminal vesicles are unremarkable in appearance. Other: No significant volume of ascites.  No pneumoperitoneum. Musculoskeletal: There are no aggressive appearing lytic or blastic lesions noted in the visualized portions of the skeleton. IMPRESSION: 1. Circumferential mass-like thickening of the distal esophagus corresponding to reported esophageal neoplasm. Haziness in the paraesophageal fat could reflect early local invasion. No definitive evidence of metastatic disease confidently identified. 2. Chronic perinephric fluid collection adjacent to the right kidney appears larger than the prior examination, now extending caudally in the retroperitoneum. Whether this represents a chronic unresolved hematoma or abscess is uncertain. No internal gas to clearly indicate infection at this time. Further clinical evaluation is recommended. 3. Aortic atherosclerosis, in addition to three-vessel coronary artery disease. Status post median sternotomy for CABG including LIMA to the LAD. 4. Colonic diverticulosis without evidence of acute diverticulitis at this time. 5. Additional incidental findings, as above. Electronically Signed   By: Trudie Reed M.D.   On: 03/16/2023 08:00   DG ESOPHAGUS W DOUBLE CM (HD)  Result Date: 03/14/2023 CLINICAL DATA:  Provided history: Essential hypertension. Dysphagia, unspecified type. Additional history: the patient reports dysphagia and vomiting for two weeks with 20 pound weight loss in this time. EXAM: ESOPHAGUS/BARIUM SWALLOW TECHNIQUE: A single contrast examination was performed using thin liquid barium. The exam was performed by Pattricia Boss, PA-C and was supervised and interpreted by Dr. Jackey Loge. FLUOROSCOPY: Radiation Exposure Index (as provided by the fluoroscopic device): 37.70 mGy  Kerma COMPARISON:  CT abdomen/pelvis 08/13/2022.  Chest CT 09/10/2013. FINDINGS: Extensive filling defects throughout the mid and distal thoracic esophagus, likely reflecting retained food and pills and markedly limiting mucosal evaluation. Markedly delayed esophageal contrast transit, with only a small amount of contrast passing into the stomach during the examination. Known small hiatal hernia (better appreciated on the prior CT abdomen/pelvis of 08/13/2022). Findings discussed with Dr. Aram Beecham by telephone at 2:40 p.m. on 03/14/2023. The patient was transported directly from the fluoroscopy suite to the emergency department. IMPRESSION: 1. Extensive filling defects throughout the mid and distal thoracic esophagus, likely reflecting retained food and pills and markedly limiting mucosal evaluation. An obstructing distal esophageal mass or stricture cannot be excluded. Endoscopy should be considered. 2. Markedly delayed esophageal contrast transit, with only a small amount of contrast passing into the stomach during the examination. 3. Known small hiatal hernia. Electronically Signed   By: Jackey Loge D.O.   On: 03/14/2023 15:38    Microbiology: Results for orders placed or performed during the hospital encounter of 05/18/22  Culture, blood (Routine x 2)     Status: None   Collection Time: 05/18/22  3:27 PM   Specimen: BLOOD  Result Value Ref Range Status   Specimen Description BLOOD RIGHT [facility]  Final   Special Requests   Final    BOTTLES DRAWN AEROBIC AND ANAEROBIC Blood Culture adequate volume   Culture   Final    NO GROWTH 5 DAYS Performed at [facility], [address] [place], [place] [address]    Report Status 05/23/2022 FINAL  Final  Culture, blood (Routine X 2) w Reflex to ID Panel     Status: None   Collection Time: 05/18/22 10:10 PM   Specimen: BLOOD  Result Value Ref Range Status  Specimen Description BLOOD BLOOD LEFT ARM  Final   Special Requests   Final     BOTTLES DRAWN AEROBIC AND ANAEROBIC Blood Culture adequate volume   Culture   Final    NO GROWTH 5 DAYS Performed at [facility], [address] [place], [place] [address]    Report Status 05/23/2022 FINAL  Final    Labs: CBC: No results for input(s): "WBC", "NEUTROABS", "HGB", "HCT", "MCV", "PLT" in the last 168 hours. Basic Metabolic Panel: Recent Labs  Lab 03/19/23 0540 03/20/23 0538 03/21/23 0609 03/22/23 0637  K 3.8 4.0 4.4 4.7  MG 1.8 2.0 2.2 2.3  PHOS 3.1 3.2 3.7 2.9   Liver Function Tests: No results for input(s): "AST", "ALT", "ALKPHOS", "BILITOT", "PROT", "ALBUMIN" in the last 168 hours. CBG: Recent Labs  Lab 03/21/23 2152 03/22/23 0134 03/22/23 0509 03/22/23 0755 03/22/23 1141  GLUCAP 378* 225* 121* 86 244*    Discharge time spent: greater than 30 minutes.  Signed: Lurene Shadow, MD [facility] 03/24/2023

## 2023-03-22 NOTE — TOC Transition Note (Addendum)
Transition of Care [facility]) - CM/SW Discharge Note   Patient Details  Name: [name]. MRN: [id number] Date of Birth: Dec 07, 1955  Transition of Care [facility]) CM/SW Contact:  Hetty Ely, RN Phone Number: 03/22/2023, 4:04 PM   Clinical Narrative:  Patient to discharge today with 5 days of Tube Feed. [employer] to provide TF and equipment, form faxed to [facility] and original Order form placed in Chart. [facility] to provide RN/PT/OT. Peg tube education provided by RN working with patient. [facility] scheduled to transport home 4:30pm. CM needs met.  4:16 pm: [facility], Mitch called to confirm order form received and sent for processing.    Final next level of care: Home w Home Health Services Barriers to Discharge: Barriers Resolved   Patient Goals and CMS Choice      Discharge Placement                    Name of family member notified: Wife Patient and family notified of of transfer: 03/22/23  Discharge Plan and Services Additional resources added to the After Visit Summary for                  DME Arranged: Tube feeding DME Agency: [employer] Date DME Agency Contacted: 03/22/23   Representative spoke with at DME Agency: Marthann Schiller HH Arranged: RN, PT, OT [facility] Agency: Other - See comment ([facility]) Date HH Agency Contacted: 03/22/23   Representative spoke with at [facility] Agency: Morrie Sheldon  Social Determinants of Health (SDOH) Interventions SDOH Screenings   Food Insecurity: No Food Insecurity (03/15/2023)  Housing: Low Risk  (03/15/2023)  Transportation Needs: No Transportation Needs (03/15/2023)  Utilities: Not At Risk (03/15/2023)  Depression (PHQ2-9): Low Risk  (11/21/2021)  Financial Resource Strain: Low Risk  (09/28/2022)   Received from [facility], [facility]  Tobacco Use: Medium Risk (03/18/2023)     Readmission Risk Interventions    02/13/2022    4:57 PM 09/29/2021   11:12 AM  Readmission Risk Prevention Plan   Transportation Screening Complete Complete  PCP or Specialist Appt within 3-5 Days  Complete  HRI or Home Care Consult  Complete  Social Work Consult for Recovery Care Planning/Counseling  Complete  Palliative Care Screening  Not Applicable  Medication Review Oceanographer) Complete Complete  PCP or Specialist appointment within 3-5 days of discharge Complete   HRI or Home Care Consult Complete   SW Recovery Care/Counseling Consult Complete   Palliative Care Screening Not Applicable   Skilled Nursing Facility Not Applicable

## 2023-03-22 NOTE — Progress Notes (Signed)
Patient discharged home with home health. Discharge instructions given. Wife at bedside. Tube feed teaching and medication administration provided. Patient anxious and nervous about it. Instructed patient to call the unit, home health, or pharmacist based on the questions she had. At this point she seemed to understand and took notes. Patient confident with instructions saying he knows how to do it and has seen nurses do iit and has participated.

## 2023-03-22 NOTE — TOC Progression Note (Signed)
Transition of Care (TOC) - Progression Note    Patient Details  Name: [name]. MRN: [id number] Date of Birth: 05-Oct-1955  Transition of Care [facility]) CM/SW Contact  Garret Reddish, RN Phone Number: 03/22/2023, 6:52 AM  Clinical Narrative:    Chart reviewed.  Meet with patient at bedside.  Patient reports that prior to admission he lived at home with his wife.  He reports that he was wheelchair bound. Patient reports that he uses [facility] for medical transport to appointments.   I have informed patient that PT has recommended home health PT and OT.  I have asked Morrie Sheldon with [facility] to accept home health referral.  Noted that patient has new peg.  I have asked Pam with [facility] to provide tube feeding and teach patient and wife. Pam will follow up with patient and wife today.  Patient reports that he will need transport home.  Patient reports he would prefer using a wheelchair van as EMS transport is to expensive.  TOC can use [facility] for dc home.    TOC will continue to follow for discharge planning.        Expected Discharge Plan and Services         Expected Discharge Date: 03/21/23                                     Social Determinants of Health (SDOH) Interventions SDOH Screenings   Food Insecurity: No Food Insecurity (03/15/2023)  Housing: Low Risk  (03/15/2023)  Transportation Needs: No Transportation Needs (03/15/2023)  Utilities: Not At Risk (03/15/2023)  Depression (PHQ2-9): Low Risk  (11/21/2021)  Financial Resource Strain: Low Risk  (09/28/2022)   Received from [facility], [facility]  Tobacco Use: Medium Risk (03/18/2023)    Readmission Risk Interventions    02/13/2022    4:57 PM 09/29/2021   11:12 AM  Readmission Risk Prevention Plan  Transportation Screening Complete Complete  PCP or Specialist Appt within 3-5 Days  Complete  HRI or Home Care Consult  Complete  Social Work Consult for  Recovery Care Planning/Counseling  Complete  Palliative Care Screening  Not Applicable  Medication Review Oceanographer) Complete Complete  PCP or Specialist appointment within 3-5 days of discharge Complete   HRI or Home Care Consult Complete   SW Recovery Care/Counseling Consult Complete   Palliative Care Screening Not Applicable   Skilled Nursing Facility Not Applicable

## 2023-03-25 ENCOUNTER — Encounter: Payer: Self-pay | Admitting: Internal Medicine

## 2023-03-25 ENCOUNTER — Ambulatory Visit: Payer: Medicare Other

## 2023-03-26 ENCOUNTER — Telehealth: Payer: Self-pay

## 2023-03-26 ENCOUNTER — Telehealth: Payer: Self-pay | Admitting: Nurse Practitioner

## 2023-03-26 ENCOUNTER — Telehealth: Payer: Self-pay | Admitting: *Deleted

## 2023-03-26 DIAGNOSIS — Z789 Other specified health status: Secondary | ICD-10-CM

## 2023-03-26 DIAGNOSIS — Z636 Dependent relative needing care at home: Secondary | ICD-10-CM

## 2023-03-26 NOTE — Telephone Encounter (Signed)
Left vm for [name] and patient.

## 2023-03-26 NOTE — Telephone Encounter (Signed)
Nutrition  Called wife and patient's listed number.  No answer on either.  Left message on both with call back number.  [name] B. Freida Busman, RD, LDN Registered Dietitian [phone]

## 2023-03-26 NOTE — Telephone Encounter (Signed)
Attempted to reach wife for additional call. Dr. Leonard Schwartz stated that Leotis Shames, NP would be willing to discuss pt/wife's concerns re: glucose mgmt via mychart visit. No answer. Unable to leave vm

## 2023-03-26 NOTE — Telephone Encounter (Signed)
Patient's wife called to report that according to his discharge papers from the hospital he was supposed to see Dr. Ascencion Dike this week. She also stated that he has been put on a tube feeding that is not appropriate for diabetics and it is causing his blood glucose to be elevated. She was in a fair amount of distress over his condition and lack of appointment.

## 2023-03-27 ENCOUNTER — Ambulatory Visit: Payer: Medicare Other

## 2023-03-27 ENCOUNTER — Other Ambulatory Visit: Payer: Self-pay

## 2023-03-27 ENCOUNTER — Other Ambulatory Visit: Payer: Self-pay | Admitting: Radiation Oncology

## 2023-03-27 ENCOUNTER — Inpatient Hospital Stay: Payer: Medicare Other | Admitting: Licensed Clinical Social Worker

## 2023-03-27 DIAGNOSIS — I130 Hypertensive heart and chronic kidney disease with heart failure and stage 1 through stage 4 chronic kidney disease, or unspecified chronic kidney disease: Secondary | ICD-10-CM | POA: Diagnosis present

## 2023-03-27 DIAGNOSIS — N400 Enlarged prostate without lower urinary tract symptoms: Secondary | ICD-10-CM | POA: Diagnosis present

## 2023-03-27 DIAGNOSIS — G4733 Obstructive sleep apnea (adult) (pediatric): Secondary | ICD-10-CM | POA: Diagnosis present

## 2023-03-27 DIAGNOSIS — E875 Hyperkalemia: Secondary | ICD-10-CM | POA: Diagnosis not present

## 2023-03-27 DIAGNOSIS — I959 Hypotension, unspecified: Secondary | ICD-10-CM | POA: Diagnosis present

## 2023-03-27 DIAGNOSIS — Z6841 Body Mass Index (BMI) 40.0 and over, adult: Secondary | ICD-10-CM

## 2023-03-27 DIAGNOSIS — Z9221 Personal history of antineoplastic chemotherapy: Secondary | ICD-10-CM

## 2023-03-27 DIAGNOSIS — J4489 Other specified chronic obstructive pulmonary disease: Secondary | ICD-10-CM | POA: Diagnosis present

## 2023-03-27 DIAGNOSIS — Z923 Personal history of irradiation: Secondary | ICD-10-CM

## 2023-03-27 DIAGNOSIS — Z7951 Long term (current) use of inhaled steroids: Secondary | ICD-10-CM

## 2023-03-27 DIAGNOSIS — I441 Atrioventricular block, second degree: Secondary | ICD-10-CM | POA: Diagnosis present

## 2023-03-27 DIAGNOSIS — Z79899 Other long term (current) drug therapy: Secondary | ICD-10-CM

## 2023-03-27 DIAGNOSIS — Z751 Person awaiting admission to adequate facility elsewhere: Secondary | ICD-10-CM

## 2023-03-27 DIAGNOSIS — G9341 Metabolic encephalopathy: Secondary | ICD-10-CM | POA: Diagnosis present

## 2023-03-27 DIAGNOSIS — Z87440 Personal history of urinary (tract) infections: Secondary | ICD-10-CM

## 2023-03-27 DIAGNOSIS — M109 Gout, unspecified: Secondary | ICD-10-CM | POA: Diagnosis present

## 2023-03-27 DIAGNOSIS — E785 Hyperlipidemia, unspecified: Secondary | ICD-10-CM | POA: Diagnosis present

## 2023-03-27 DIAGNOSIS — Z9181 History of falling: Secondary | ICD-10-CM

## 2023-03-27 DIAGNOSIS — R000 Tachycardia, unspecified: Secondary | ICD-10-CM | POA: Diagnosis present

## 2023-03-27 DIAGNOSIS — D631 Anemia in chronic kidney disease: Secondary | ICD-10-CM | POA: Diagnosis present

## 2023-03-27 DIAGNOSIS — I5032 Chronic diastolic (congestive) heart failure: Secondary | ICD-10-CM | POA: Diagnosis present

## 2023-03-27 DIAGNOSIS — E1165 Type 2 diabetes mellitus with hyperglycemia: Secondary | ICD-10-CM | POA: Diagnosis present

## 2023-03-27 DIAGNOSIS — Z811 Family history of alcohol abuse and dependence: Secondary | ICD-10-CM

## 2023-03-27 DIAGNOSIS — E039 Hypothyroidism, unspecified: Secondary | ICD-10-CM | POA: Diagnosis present

## 2023-03-27 DIAGNOSIS — E1151 Type 2 diabetes mellitus with diabetic peripheral angiopathy without gangrene: Secondary | ICD-10-CM | POA: Diagnosis present

## 2023-03-27 DIAGNOSIS — R443 Hallucinations, unspecified: Secondary | ICD-10-CM | POA: Diagnosis not present

## 2023-03-27 DIAGNOSIS — E871 Hypo-osmolality and hyponatremia: Secondary | ICD-10-CM | POA: Diagnosis present

## 2023-03-27 DIAGNOSIS — Z794 Long term (current) use of insulin: Secondary | ICD-10-CM

## 2023-03-27 DIAGNOSIS — R338 Other retention of urine: Secondary | ICD-10-CM | POA: Diagnosis present

## 2023-03-27 DIAGNOSIS — Z89512 Acquired absence of left leg below knee: Secondary | ICD-10-CM

## 2023-03-27 DIAGNOSIS — B961 Klebsiella pneumoniae [K. pneumoniae] as the cause of diseases classified elsewhere: Secondary | ICD-10-CM | POA: Diagnosis present

## 2023-03-27 DIAGNOSIS — Z7401 Bed confinement status: Secondary | ICD-10-CM

## 2023-03-27 DIAGNOSIS — F17290 Nicotine dependence, other tobacco product, uncomplicated: Secondary | ICD-10-CM | POA: Diagnosis present

## 2023-03-27 DIAGNOSIS — N189 Chronic kidney disease, unspecified: Secondary | ICD-10-CM | POA: Diagnosis present

## 2023-03-27 DIAGNOSIS — E440 Moderate protein-calorie malnutrition: Secondary | ICD-10-CM | POA: Diagnosis present

## 2023-03-27 DIAGNOSIS — N390 Urinary tract infection, site not specified: Secondary | ICD-10-CM | POA: Diagnosis present

## 2023-03-27 DIAGNOSIS — C801 Malignant (primary) neoplasm, unspecified: Secondary | ICD-10-CM

## 2023-03-27 DIAGNOSIS — N3944 Nocturnal enuresis: Secondary | ICD-10-CM | POA: Diagnosis present

## 2023-03-27 DIAGNOSIS — Z515 Encounter for palliative care: Secondary | ICD-10-CM

## 2023-03-27 DIAGNOSIS — I2510 Atherosclerotic heart disease of native coronary artery without angina pectoris: Secondary | ICD-10-CM | POA: Diagnosis present

## 2023-03-27 DIAGNOSIS — I4819 Other persistent atrial fibrillation: Secondary | ICD-10-CM | POA: Diagnosis present

## 2023-03-27 DIAGNOSIS — N2889 Other specified disorders of kidney and ureter: Secondary | ICD-10-CM | POA: Diagnosis present

## 2023-03-27 DIAGNOSIS — Z931 Gastrostomy status: Secondary | ICD-10-CM

## 2023-03-27 DIAGNOSIS — Z89511 Acquired absence of right leg below knee: Secondary | ICD-10-CM

## 2023-03-27 DIAGNOSIS — Z951 Presence of aortocoronary bypass graft: Secondary | ICD-10-CM

## 2023-03-27 DIAGNOSIS — Z888 Allergy status to other drugs, medicaments and biological substances status: Secondary | ICD-10-CM

## 2023-03-27 DIAGNOSIS — N179 Acute kidney failure, unspecified: Secondary | ICD-10-CM | POA: Diagnosis not present

## 2023-03-27 DIAGNOSIS — Z8501 Personal history of malignant neoplasm of esophagus: Secondary | ICD-10-CM

## 2023-03-27 DIAGNOSIS — Z7901 Long term (current) use of anticoagulants: Secondary | ICD-10-CM

## 2023-03-27 DIAGNOSIS — I252 Old myocardial infarction: Secondary | ICD-10-CM

## 2023-03-27 DIAGNOSIS — A0472 Enterocolitis due to Clostridium difficile, not specified as recurrent: Secondary | ICD-10-CM | POA: Diagnosis present

## 2023-03-27 DIAGNOSIS — Z8679 Personal history of other diseases of the circulatory system: Secondary | ICD-10-CM

## 2023-03-27 DIAGNOSIS — F32A Depression, unspecified: Secondary | ICD-10-CM | POA: Diagnosis present

## 2023-03-27 DIAGNOSIS — Z79890 Hormone replacement therapy (postmenopausal): Secondary | ICD-10-CM

## 2023-03-27 DIAGNOSIS — E860 Dehydration: Secondary | ICD-10-CM | POA: Diagnosis present

## 2023-03-27 DIAGNOSIS — E1122 Type 2 diabetes mellitus with diabetic chronic kidney disease: Secondary | ICD-10-CM | POA: Diagnosis present

## 2023-03-27 DIAGNOSIS — R296 Repeated falls: Secondary | ICD-10-CM | POA: Diagnosis present

## 2023-03-27 DIAGNOSIS — Z880 Allergy status to penicillin: Secondary | ICD-10-CM

## 2023-03-27 DIAGNOSIS — Z8249 Family history of ischemic heart disease and other diseases of the circulatory system: Secondary | ICD-10-CM

## 2023-03-27 DIAGNOSIS — E1142 Type 2 diabetes mellitus with diabetic polyneuropathy: Secondary | ICD-10-CM | POA: Diagnosis present

## 2023-03-27 DIAGNOSIS — I483 Typical atrial flutter: Secondary | ICD-10-CM | POA: Diagnosis present

## 2023-03-27 DIAGNOSIS — I4510 Unspecified right bundle-branch block: Secondary | ICD-10-CM | POA: Diagnosis present

## 2023-03-27 NOTE — ED Triage Notes (Signed)
BIB [facility] from home for profuse diarrhea per wife and family member. Pt had G tube placed 03/18/23. Wife reports some confusion yesterday but that she thought it was because he was dreaming. Diarrhea onset today and reports 5 episodes today. Denies blood in stool. Denies n/v. Pt is bedbound and uses hoyer lift. Presents with home sling under pt. Pt is also bilateral BKA. Also report concern for developing/worsening pressure ulcer to sacrum as well as scrotum. Wife also reports that pt slid off of the bed today. Pt was seen at [facility] earlier today.   Vitals from home from family member:  69/54 Spo2 87% CBG 248 HR 97 RR 16 and shallow

## 2023-03-27 NOTE — ED Triage Notes (Signed)
EMS brings pt in from home for c/o diarrhea that began today

## 2023-03-27 NOTE — Telephone Encounter (Signed)
Incorrect info corrected and resubmitted. I tried re-entering the ins info.

## 2023-03-27 NOTE — Progress Notes (Signed)
[facility] Clinical Social Work  Clinical Social Work was referred by medical provider for assessment of psychosocial needs.  Clinical Social Worker contacted caregiver by phone to offer support and assess for needs.  CSW spoke with patient's wife, [name], twice today.  She was involved in caregiving and unable to talk long.  She expressed feeling overwhelmed and asked for CSW to contact her at another time.         Dorothey Baseman, LCSW  Clinical Social Worker [facility]

## 2023-03-28 ENCOUNTER — Inpatient Hospital Stay
Admission: EM | Admit: 2023-03-28 | Discharge: 2023-05-03 | DRG: 682 | Disposition: A | Discharge status: Skilled Nursing Facility | Payer: Medicare Other | Attending: Obstetrics and Gynecology | Admitting: Obstetrics and Gynecology

## 2023-03-28 ENCOUNTER — Emergency Department: Payer: Medicare Other

## 2023-03-28 ENCOUNTER — Other Ambulatory Visit: Payer: Self-pay

## 2023-03-28 DIAGNOSIS — R197 Diarrhea, unspecified: Secondary | ICD-10-CM | POA: Diagnosis not present

## 2023-03-28 DIAGNOSIS — N179 Acute kidney failure, unspecified: Secondary | ICD-10-CM | POA: Diagnosis present

## 2023-03-28 DIAGNOSIS — R443 Hallucinations, unspecified: Secondary | ICD-10-CM | POA: Diagnosis not present

## 2023-03-28 DIAGNOSIS — I4892 Unspecified atrial flutter: Secondary | ICD-10-CM | POA: Insufficient documentation

## 2023-03-28 DIAGNOSIS — C155 Malignant neoplasm of lower third of esophagus: Secondary | ICD-10-CM

## 2023-03-28 DIAGNOSIS — E875 Hyperkalemia: Principal | ICD-10-CM

## 2023-03-28 DIAGNOSIS — A09 Infectious gastroenteritis and colitis, unspecified: Secondary | ICD-10-CM | POA: Diagnosis not present

## 2023-03-28 DIAGNOSIS — I25119 Atherosclerotic heart disease of native coronary artery with unspecified angina pectoris: Secondary | ICD-10-CM | POA: Diagnosis not present

## 2023-03-28 DIAGNOSIS — E440 Moderate protein-calorie malnutrition: Secondary | ICD-10-CM | POA: Diagnosis present

## 2023-03-28 DIAGNOSIS — I4819 Other persistent atrial fibrillation: Secondary | ICD-10-CM | POA: Diagnosis present

## 2023-03-28 DIAGNOSIS — I10 Essential (primary) hypertension: Secondary | ICD-10-CM | POA: Diagnosis not present

## 2023-03-28 DIAGNOSIS — I2583 Coronary atherosclerosis due to lipid rich plaque: Secondary | ICD-10-CM | POA: Diagnosis not present

## 2023-03-28 DIAGNOSIS — N2889 Other specified disorders of kidney and ureter: Secondary | ICD-10-CM | POA: Diagnosis not present

## 2023-03-28 DIAGNOSIS — I9589 Other hypotension: Secondary | ICD-10-CM | POA: Diagnosis not present

## 2023-03-28 DIAGNOSIS — B961 Klebsiella pneumoniae [K. pneumoniae] as the cause of diseases classified elsewhere: Secondary | ICD-10-CM | POA: Diagnosis present

## 2023-03-28 DIAGNOSIS — E860 Dehydration: Secondary | ICD-10-CM | POA: Diagnosis present

## 2023-03-28 DIAGNOSIS — R410 Disorientation, unspecified: Secondary | ICD-10-CM | POA: Diagnosis present

## 2023-03-28 DIAGNOSIS — C159 Malignant neoplasm of esophagus, unspecified: Secondary | ICD-10-CM | POA: Diagnosis not present

## 2023-03-28 DIAGNOSIS — Z6841 Body Mass Index (BMI) 40.0 and over, adult: Secondary | ICD-10-CM | POA: Diagnosis not present

## 2023-03-28 DIAGNOSIS — R339 Retention of urine, unspecified: Secondary | ICD-10-CM

## 2023-03-28 DIAGNOSIS — C801 Malignant (primary) neoplasm, unspecified: Secondary | ICD-10-CM

## 2023-03-28 DIAGNOSIS — D631 Anemia in chronic kidney disease: Secondary | ICD-10-CM | POA: Diagnosis present

## 2023-03-28 DIAGNOSIS — I2510 Atherosclerotic heart disease of native coronary artery without angina pectoris: Secondary | ICD-10-CM | POA: Diagnosis not present

## 2023-03-28 DIAGNOSIS — Z515 Encounter for palliative care: Secondary | ICD-10-CM | POA: Diagnosis not present

## 2023-03-28 DIAGNOSIS — G9341 Metabolic encephalopathy: Secondary | ICD-10-CM | POA: Diagnosis present

## 2023-03-28 DIAGNOSIS — I130 Hypertensive heart and chronic kidney disease with heart failure and stage 1 through stage 4 chronic kidney disease, or unspecified chronic kidney disease: Secondary | ICD-10-CM | POA: Diagnosis present

## 2023-03-28 DIAGNOSIS — N189 Chronic kidney disease, unspecified: Secondary | ICD-10-CM | POA: Diagnosis present

## 2023-03-28 DIAGNOSIS — I4719 Other supraventricular tachycardia: Secondary | ICD-10-CM | POA: Diagnosis not present

## 2023-03-28 DIAGNOSIS — E871 Hypo-osmolality and hyponatremia: Secondary | ICD-10-CM | POA: Diagnosis present

## 2023-03-28 DIAGNOSIS — K909 Intestinal malabsorption, unspecified: Secondary | ICD-10-CM | POA: Diagnosis not present

## 2023-03-28 DIAGNOSIS — I480 Paroxysmal atrial fibrillation: Secondary | ICD-10-CM | POA: Diagnosis not present

## 2023-03-28 DIAGNOSIS — I483 Typical atrial flutter: Secondary | ICD-10-CM

## 2023-03-28 DIAGNOSIS — N390 Urinary tract infection, site not specified: Secondary | ICD-10-CM | POA: Diagnosis present

## 2023-03-28 DIAGNOSIS — E039 Hypothyroidism, unspecified: Secondary | ICD-10-CM | POA: Diagnosis present

## 2023-03-28 DIAGNOSIS — E1122 Type 2 diabetes mellitus with diabetic chronic kidney disease: Secondary | ICD-10-CM | POA: Diagnosis present

## 2023-03-28 DIAGNOSIS — F32A Depression, unspecified: Secondary | ICD-10-CM | POA: Diagnosis present

## 2023-03-28 DIAGNOSIS — A0472 Enterocolitis due to Clostridium difficile, not specified as recurrent: Secondary | ICD-10-CM | POA: Diagnosis present

## 2023-03-28 DIAGNOSIS — R0602 Shortness of breath: Secondary | ICD-10-CM

## 2023-03-28 DIAGNOSIS — I5032 Chronic diastolic (congestive) heart failure: Secondary | ICD-10-CM | POA: Diagnosis present

## 2023-03-28 DIAGNOSIS — J4489 Other specified chronic obstructive pulmonary disease: Secondary | ICD-10-CM | POA: Diagnosis present

## 2023-03-28 LAB — CBC WITH DIFFERENTIAL/PLATELET
Abs Immature Granulocytes: 0.02 10*3/uL (ref 0.00–0.07)
Basophils Absolute: 0.1 10*3/uL (ref 0.0–0.1)
Basophils Relative: 1 %
Eosinophils Absolute: 0.3 10*3/uL (ref 0.0–0.5)
Eosinophils Relative: 3 %
HCT: 37.7 % — ABNORMAL LOW (ref 39.0–52.0)
Hemoglobin: 11.8 g/dL — ABNORMAL LOW (ref 13.0–17.0)
Immature Granulocytes: 0 %
Lymphocytes Relative: 12 %
Lymphs Abs: 1.2 10*3/uL (ref 0.7–4.0)
MCH: 29.6 pg (ref 26.0–34.0)
MCHC: 31.3 g/dL (ref 30.0–36.0)
MCV: 94.5 fL (ref 80.0–100.0)
Monocytes Absolute: 0.9 10*3/uL (ref 0.1–1.0)
Monocytes Relative: 8 %
Neutro Abs: 8 10*3/uL — ABNORMAL HIGH (ref 1.7–7.7)
Neutrophils Relative %: 76 %
Platelets: 240 10*3/uL (ref 150–400)
RBC: 3.99 MIL/uL — ABNORMAL LOW (ref 4.22–5.81)
RDW: 14.6 % (ref 11.5–15.5)
WBC: 10.6 10*3/uL — ABNORMAL HIGH (ref 4.0–10.5)
nRBC: 0 % (ref 0.0–0.2)

## 2023-03-28 LAB — URINALYSIS, W/ REFLEX TO CULTURE (INFECTION SUSPECTED)
Bacteria, UA: NONE SEEN
Bilirubin Urine: NEGATIVE
Glucose, UA: NEGATIVE mg/dL
Hgb urine dipstick: NEGATIVE
Ketones, ur: NEGATIVE mg/dL
Leukocytes,Ua: NEGATIVE
Nitrite: NEGATIVE
Protein, ur: 30 mg/dL — AB
RBC / HPF: 0 RBC/hpf (ref 0–5)
Specific Gravity, Urine: 1.012 (ref 1.005–1.030)
Squamous Epithelial / HPF: 0 /[HPF] (ref 0–5)
pH: 7 (ref 5.0–8.0)

## 2023-03-28 LAB — GASTROINTESTINAL PANEL BY PCR, STOOL (REPLACES STOOL CULTURE)

## 2023-03-28 LAB — C DIFFICILE QUICK SCREEN W PCR REFLEX
C Diff antigen: POSITIVE — AB
C Diff toxin: NEGATIVE

## 2023-03-28 LAB — COMPREHENSIVE METABOLIC PANEL
ALT: 18 U/L (ref 0–44)
AST: 14 U/L — ABNORMAL LOW (ref 15–41)
Albumin: 2.8 g/dL — ABNORMAL LOW (ref 3.5–5.0)
Alkaline Phosphatase: 120 U/L (ref 38–126)
Anion gap: 9 (ref 5–15)
BUN: 61 mg/dL — ABNORMAL HIGH (ref 8–23)
CO2: 30 mmol/L (ref 22–32)
Calcium: 8.6 mg/dL — ABNORMAL LOW (ref 8.9–10.3)
Chloride: 94 mmol/L — ABNORMAL LOW (ref 98–111)
Creatinine, Ser: 1.71 mg/dL — ABNORMAL HIGH (ref 0.61–1.24)
GFR, Estimated: 43 mL/min — ABNORMAL LOW (ref >60)
Glucose, Bld: 252 mg/dL — ABNORMAL HIGH (ref 70–99)
Potassium: 6.5 mmol/L (ref 3.5–5.1)
Sodium: 133 mmol/L — ABNORMAL LOW (ref 135–145)
Total Bilirubin: 0.4 mg/dL (ref <1.2)
Total Protein: 6.5 g/dL (ref 6.5–8.1)

## 2023-03-28 LAB — BASIC METABOLIC PANEL
Anion gap: 11 (ref 5–15)
BUN: 62 mg/dL — ABNORMAL HIGH (ref 8–23)
CO2: 30 mmol/L (ref 22–32)
Calcium: 8.8 mg/dL — ABNORMAL LOW (ref 8.9–10.3)
Chloride: 92 mmol/L — ABNORMAL LOW (ref 98–111)
Creatinine, Ser: 1.74 mg/dL — ABNORMAL HIGH (ref 0.61–1.24)
GFR, Estimated: 42 mL/min — ABNORMAL LOW (ref >60)
Glucose, Bld: 171 mg/dL — ABNORMAL HIGH (ref 70–99)
Potassium: 6.2 mmol/L — ABNORMAL HIGH (ref 3.5–5.1)
Sodium: 133 mmol/L — ABNORMAL LOW (ref 135–145)

## 2023-03-28 LAB — MAGNESIUM: Magnesium: 2.7 mg/dL — ABNORMAL HIGH (ref 1.7–2.4)

## 2023-03-28 LAB — POTASSIUM: Potassium: 6.7 mmol/L (ref 3.5–5.1)

## 2023-03-28 LAB — PROTIME-INR
INR: 1.4 — ABNORMAL HIGH (ref 0.8–1.2)
Prothrombin Time: 17.1 s — ABNORMAL HIGH (ref 11.4–15.2)

## 2023-03-28 LAB — CBG MONITORING, ED
Glucose-Capillary: 138 mg/dL — ABNORMAL HIGH (ref 70–99)
Glucose-Capillary: 135 mg/dL — ABNORMAL HIGH (ref 70–99)
Glucose-Capillary: 130 mg/dL — ABNORMAL HIGH (ref 70–99)
Glucose-Capillary: 94 mg/dL (ref 70–99)

## 2023-03-28 LAB — LACTIC ACID, PLASMA: Lactic Acid, Venous: 1.6 mmol/L (ref 0.5–1.9)

## 2023-03-28 LAB — CLOSTRIDIUM DIFFICILE BY PCR, REFLEXED: Toxigenic C. Difficile by PCR: POSITIVE — AB

## 2023-03-28 MED ORDER — IPRATROPIUM-ALBUTEROL 0.5-2.5 (3) MG/3ML IN SOLN: 3.0000 mL | RESPIRATORY_TRACT | Status: DC | PRN

## 2023-03-28 MED ORDER — IOHEXOL 300 MG/ML  SOLN
80.0000 mL | Freq: Once | INTRAMUSCULAR | Status: AC | PRN
Start: 2023-03-28 — End: 2023-03-28
  Administered 2023-03-28: 80 mL via INTRAVENOUS

## 2023-03-28 MED ORDER — SODIUM ZIRCONIUM CYCLOSILICATE 10 G PO PACK
10.0000 g | PACK | Freq: Two times a day (BID) | ORAL | Status: AC
  Administered 2023-03-28 – 2023-03-29 (×4): 10 g
  Filled 2023-03-28 (×4): qty 1

## 2023-03-28 MED ORDER — ASPIRIN 81 MG PO CHEW
81.0000 mg | CHEWABLE_TABLET | Freq: Every day | ORAL | Status: DC
  Administered 2023-03-28 – 2023-04-14 (×17): 81 mg
  Filled 2023-03-28 (×17): qty 1

## 2023-03-28 MED ORDER — INSULIN ASPART 100 UNIT/ML IJ SOLN
0.0000 [IU] | Freq: Every day | INTRAMUSCULAR | Status: DC
Start: 2023-03-28 — End: 2023-05-03
  Administered 2023-04-01: 3 [IU] via SUBCUTANEOUS
  Administered 2023-04-02 – 2023-04-05 (×2): 2 [IU] via SUBCUTANEOUS
  Administered 2023-04-08: 3 [IU] via SUBCUTANEOUS
  Administered 2023-04-12 – 2023-05-02 (×9): 2 [IU] via SUBCUTANEOUS
  Filled 2023-03-28 (×16): qty 1

## 2023-03-28 MED ORDER — PIPERACILLIN-TAZOBACTAM 3.375 G IVPB
3.3750 g | Freq: Three times a day (TID) | INTRAVENOUS | Status: DC
  Administered 2023-03-28 – 2023-03-31 (×9): 3.375 g via INTRAVENOUS
  Filled 2023-03-28 (×10): qty 50

## 2023-03-28 MED ORDER — ALBUTEROL SULFATE (2.5 MG/3ML) 0.083% IN NEBU
10.0000 mg | INHALATION_SOLUTION | Freq: Once | RESPIRATORY_TRACT | Status: AC
  Administered 2023-03-28: 10 mg via RESPIRATORY_TRACT
  Filled 2023-03-28: qty 12

## 2023-03-28 MED ORDER — LACTATED RINGERS IV BOLUS
1000.0000 mL | Freq: Once | INTRAVENOUS | Status: AC
  Administered 2023-03-28: 1000 mL via INTRAVENOUS

## 2023-03-28 MED ORDER — CLONAZEPAM 0.5 MG PO TABS
0.5000 mg | ORAL_TABLET | Freq: Two times a day (BID) | ORAL | Status: DC
  Administered 2023-03-28 – 2023-04-14 (×34): 0.5 mg
  Filled 2023-03-28 (×34): qty 1

## 2023-03-28 MED ORDER — INSULIN ASPART 100 UNIT/ML IV SOLN
5.0000 [IU] | Freq: Once | INTRAVENOUS | Status: AC
  Administered 2023-03-28: 5 [IU] via INTRAVENOUS
  Filled 2023-03-28: qty 0.05

## 2023-03-28 MED ORDER — PRAMIPEXOLE DIHYDROCHLORIDE 1 MG PO TABS
2.0000 mg | ORAL_TABLET | Freq: Every day | ORAL | Status: DC
  Administered 2023-03-28 – 2023-04-13 (×17): 2 mg
  Filled 2023-03-28 (×18): qty 2

## 2023-03-28 MED ORDER — SODIUM CHLORIDE 0.9 % IV BOLUS
1000.0000 mL | Freq: Once | INTRAVENOUS | Status: AC
  Administered 2023-03-28: 1000 mL via INTRAVENOUS

## 2023-03-28 MED ORDER — INSULIN ASPART 100 UNIT/ML IJ SOLN
0.0000 [IU] | Freq: Three times a day (TID) | INTRAMUSCULAR | Status: DC
Start: 2023-03-28 — End: 2023-05-03
  Administered 2023-03-28 – 2023-03-31 (×2): 1 [IU] via SUBCUTANEOUS
  Administered 2023-03-31 – 2023-04-02 (×8): 2 [IU] via SUBCUTANEOUS
  Administered 2023-04-03: 3 [IU] via SUBCUTANEOUS
  Administered 2023-04-03 – 2023-04-04 (×4): 2 [IU] via SUBCUTANEOUS
  Administered 2023-04-04 – 2023-04-05 (×2): 3 [IU] via SUBCUTANEOUS
  Administered 2023-04-05 – 2023-04-07 (×8): 2 [IU] via SUBCUTANEOUS
  Administered 2023-04-08 (×2): 3 [IU] via SUBCUTANEOUS
  Administered 2023-04-08: 5 [IU] via SUBCUTANEOUS
  Administered 2023-04-09: 2 [IU] via SUBCUTANEOUS
  Administered 2023-04-09: 5 [IU] via SUBCUTANEOUS
  Administered 2023-04-09: 3 [IU] via SUBCUTANEOUS
  Administered 2023-04-10 (×2): 2 [IU] via SUBCUTANEOUS
  Administered 2023-04-10: 1 [IU] via SUBCUTANEOUS
  Administered 2023-04-11 – 2023-04-12 (×4): 2 [IU] via SUBCUTANEOUS
  Administered 2023-04-12: 3 [IU] via SUBCUTANEOUS
  Administered 2023-04-12: 2 [IU] via SUBCUTANEOUS
  Administered 2023-04-13: 3 [IU] via SUBCUTANEOUS
  Administered 2023-04-13: 1 [IU] via SUBCUTANEOUS
  Administered 2023-04-13 – 2023-04-14 (×2): 2 [IU] via SUBCUTANEOUS
  Administered 2023-04-14: 3 [IU] via SUBCUTANEOUS
  Administered 2023-04-14 – 2023-04-15 (×2): 2 [IU] via SUBCUTANEOUS
  Administered 2023-04-15 – 2023-04-16 (×3): 3 [IU] via SUBCUTANEOUS
  Administered 2023-04-16: 2 [IU] via SUBCUTANEOUS
  Administered 2023-04-17 (×3): 3 [IU] via SUBCUTANEOUS
  Administered 2023-04-18 (×3): 5 [IU] via SUBCUTANEOUS
  Administered 2023-04-19 (×3): 3 [IU] via SUBCUTANEOUS
  Administered 2023-04-20: 2 [IU] via SUBCUTANEOUS
  Administered 2023-04-20 (×2): 3 [IU] via SUBCUTANEOUS
  Administered 2023-04-21: 2 [IU] via SUBCUTANEOUS
  Administered 2023-04-21: 3 [IU] via SUBCUTANEOUS
  Administered 2023-04-21: 2 [IU] via SUBCUTANEOUS
  Administered 2023-04-22: 3 [IU] via SUBCUTANEOUS
  Administered 2023-04-22 – 2023-04-23 (×3): 2 [IU] via SUBCUTANEOUS
  Administered 2023-04-23: 1 [IU] via SUBCUTANEOUS
  Administered 2023-04-24 (×2): 2 [IU] via SUBCUTANEOUS
  Administered 2023-04-24: 3 [IU] via SUBCUTANEOUS
  Administered 2023-04-25: 2 [IU] via SUBCUTANEOUS
  Administered 2023-04-25 – 2023-04-26 (×4): 3 [IU] via SUBCUTANEOUS
  Administered 2023-04-26: 5 [IU] via SUBCUTANEOUS
  Administered 2023-04-27: 3 [IU] via SUBCUTANEOUS
  Administered 2023-04-27: 2 [IU] via SUBCUTANEOUS
  Administered 2023-04-27 – 2023-04-28 (×4): 3 [IU] via SUBCUTANEOUS
  Administered 2023-04-29: 2 [IU] via SUBCUTANEOUS
  Administered 2023-04-29: 3 [IU] via SUBCUTANEOUS
  Administered 2023-04-29 – 2023-05-01 (×7): 2 [IU] via SUBCUTANEOUS
  Administered 2023-05-02: 3 [IU] via SUBCUTANEOUS
  Administered 2023-05-03: 2 [IU] via SUBCUTANEOUS
  Administered 2023-05-03: 3 [IU] via SUBCUTANEOUS
  Administered 2023-05-03: 2 [IU] via SUBCUTANEOUS
  Filled 2023-03-28 (×94): qty 1

## 2023-03-28 MED ORDER — INSULIN ASPART 100 UNIT/ML IV SOLN
10.0000 [IU] | Freq: Once | INTRAVENOUS | Status: AC
  Administered 2023-03-28: 10 [IU] via INTRAVENOUS
  Filled 2023-03-28: qty 0.1

## 2023-03-28 MED ORDER — CALCIUM GLUCONATE-NACL 1-0.675 GM/50ML-% IV SOLN
1.0000 g | Freq: Once | INTRAVENOUS | Status: AC
  Administered 2023-03-28: 1000 mg via INTRAVENOUS
  Filled 2023-03-28: qty 50

## 2023-03-28 MED ORDER — SODIUM ZIRCONIUM CYCLOSILICATE 10 G PO PACK
10.0000 g | PACK | ORAL | Status: AC
  Administered 2023-03-28: 10 g via ORAL
  Filled 2023-03-28: qty 1

## 2023-03-28 MED ORDER — AMIODARONE HCL 200 MG PO TABS
200.0000 mg | ORAL_TABLET | Freq: Every day | ORAL | Status: DC
  Administered 2023-03-28 – 2023-04-04 (×7): 200 mg
  Filled 2023-03-28 (×7): qty 1

## 2023-03-28 MED ORDER — CALCIUM GLUCONATE 10 % IV SOLN
1.0000 g | Freq: Once | INTRAVENOUS | Status: AC
  Administered 2023-03-28: 1 g via INTRAVENOUS
  Filled 2023-03-28: qty 10

## 2023-03-28 MED ORDER — DEXTROSE 50 % IV SOLN
25.0000 mL | Freq: Once | INTRAVENOUS | Status: AC
  Administered 2023-03-28: 25 mL via INTRAVENOUS
  Filled 2023-03-28: qty 50

## 2023-03-28 MED ORDER — INSULIN GLARGINE-YFGN 100 UNIT/ML ~~LOC~~ SOLN
15.0000 [IU] | Freq: Every day | SUBCUTANEOUS | Status: DC
  Administered 2023-03-28 – 2023-04-03 (×7): 15 [IU] via SUBCUTANEOUS
  Filled 2023-03-28 (×8): qty 0.15

## 2023-03-28 MED ORDER — LEVOTHYROXINE SODIUM 25 MCG PO TABS
25.0000 ug | ORAL_TABLET | Freq: Every day | ORAL | Status: DC
  Administered 2023-03-31 – 2023-04-14 (×15): 25 ug
  Filled 2023-03-28 (×15): qty 1

## 2023-03-28 NOTE — ED Notes (Signed)
Dr York Cerise notified of current bp 93/52. Fluids ordered

## 2023-03-28 NOTE — Consult Note (Signed)
Urology Consult  I have been asked to see the patient by Dr. York Cerise, for evaluation and management of perinephric hematoma vs. abscess.  Chief Complaint: diarrhea  History of Present Illness: [name]. is a 67 y.o. year old male who presents to the emergency room overnight with diarrhea and hyperkalemia along with acute kidney injury.  Notably, he recently had a prolonged admission for Port-A-Cath and G-tube placement in the setting of newly diagnosed esophageal cancer.  As part of his workup, patient had not voided for nearly 12 hours.  An In-N-Out catheter was ordered for the purpose of obtaining a specimen.  At that point in time, he was noted to have over a liter in his bladder and ultimately an indwelling Foley catheter was placed.  His creatinine was noted to be 1.7, elevated at the time of admission along with hyperkalemia.  He does report today that he does not urinate very much at home.  When he does urinate, he voids with very large volumes.  He also has baseline nocturnal enuresis.  He denied being uncomfortable or having suprapubic discomfort or fullness prior to being found to have a distended bladder.  In addition to this, he underwent CT abdomen pelvis with contrast that shows an enlarging perinephric collection with heterogeneous attenuation now measuring 11.5 x 5.9 with surrounding peripheral enhancement inflammatory changes.  This is felt to be possibly enlarging abscess.  He denies any flank pain or gross hematuria.  His urinalysis is negative.  Notably, he has been followed for a large possibly provoked perinephric/retroperitoneal hematoma hematoma of the right kidney just over a year ago measuring 4.8 x 9 x 13.8 cm.  At the time, there was concern for possible underlying lesion and this has been followed on serial CT scans with interval slow but steady resolution.  CT scan from April showed marked decrease in size and no obvious underlying mass.  More recent  imaging however in the form of CT chest abdomen pelvis from 03/16/2023 as part of his staging for esophageal cancer showed interval increase in size of the collection measuring 6.6 x 2.9 on 03/16/2023.    Past Medical History:  Diagnosis Date   Arrhythmia    atrial fibrillation   Asthma    CHF (congestive heart failure) (HCC)    COPD (chronic obstructive pulmonary disease) (HCC)    Coronary artery disease    Depression    Diabetes mellitus without complication (HCC)    Gout    History anabolic steroid use    Hyperlipidemia    Hypertension    Hypogonadism in male    MI (myocardial infarction) (HCC)    Morbid obesity (HCC)    Myocardial infarction (HCC)    Peripheral vascular disease (HCC)    Perirectal abscess    Pleurisy    Sleep apnea    CPAP at night, no oxygen   Varicella     Past Surgical History:  Procedure Laterality Date   ABDOMINAL AORTIC ANEURYSM REPAIR     ACHILLES TENDON SURGERY Left 01/10/2021   Procedure: ACHILLES LENGTHENING/KIDNER;  Surgeon: Rosetta Posner, DPM;  Location: [facility];  Service: Podiatry;  Laterality: Left;   AMPUTATION Left 10/03/2021   Procedure: AMPUTATION BELOW KNEE;  Surgeon: Renford Dills, MD;  Location: [facility];  Service: Vascular;  Laterality: Left;   AMPUTATION Right 05/24/2022   Procedure: AMPUTATION BELOW KNEE;  Surgeon: Annice Needy, MD;  Location: [facility];  Service: General;  Laterality:  Right;   AMPUTATION TOE Right 02/10/2016   Procedure: AMPUTATION TOE 3RD TOE;  Surgeon: Gwyneth Revels, DPM;  Location: [facility];  Service: Podiatry;  Laterality: Right;   AMPUTATION TOE Left 02/24/2020   Procedure: AMPUTATION TOE;  Surgeon: Rosetta Posner, DPM;  Location: [facility];  Service: Podiatry;  Laterality: Left;   APPLICATION OF WOUND VAC Left 02/29/2020   Procedure: APPLICATION OF WOUND VAC;  Surgeon: Rosetta Posner, DPM;  Location: [facility];  Service: Podiatry;  Laterality: Left;   BIOPSY  03/15/2023   Procedure: BIOPSY;  Surgeon:  [place] Mood, MD;  Location: [facility] ENDOSCOPY;  Service: Gastroenterology;;   CARDIAC CATHETERIZATION     CARDIOVERSION [name] 02/13/2022   Procedure: CARDIOVERSION;  Surgeon: Lamar Blinks, MD;  Location: [facility];  Service: Cardiovascular;  Laterality: [name];   COLONOSCOPY WITH PROPOFOL [name] 11/18/2015   Procedure: COLONOSCOPY WITH PROPOFOL;  Surgeon: Scot Jun, MD;  Location: [facility] ENDOSCOPY;  Service: Endoscopy;  Laterality: [name];   CORONARY ARTERY BYPASS GRAFT     CORONARY STENT INTERVENTION [name] 02/02/2020   Procedure: CORONARY STENT INTERVENTION;  Surgeon: Marcina Millard, MD;  Location: [facility];  Service: Cardiovascular;  Laterality: [name];   ESOPHAGOGASTRODUODENOSCOPY (EGD) WITH PROPOFOL [name] 03/15/2023   Procedure: ESOPHAGOGASTRODUODENOSCOPY (EGD) WITH PROPOFOL;  Surgeon: [place] Mood, MD;  Location: [facility] ENDOSCOPY;  Service: Gastroenterology;  Laterality: [name];   GASTROSTOMY [name] 03/18/2023   Procedure: INSERTION OF GASTROSTOMY TUBE;  Surgeon: Carolan Shiver, MD;  Location: [facility];  Service: General;  Laterality: [name];   IMPACTION REMOVAL  03/15/2023   Procedure: IMPACTION REMOVAL;  Surgeon: [place] Mood, MD;  Location: [facility] ENDOSCOPY;  Service: Gastroenterology;;   INCISION AND DRAINAGE Left 08/07/2021   Procedure: INCISION AND DRAINAGE-Partial Calcanectomy;  Surgeon: Rosetta Posner, DPM;  Location: [facility];  Service: Podiatry;  Laterality: Left;   IRRIGATION AND DEBRIDEMENT FOOT Left 02/29/2020   Procedure: IRRIGATION AND DEBRIDEMENT FOOT;  Surgeon: Rosetta Posner, DPM;  Location: [facility];  Service: Podiatry;  Laterality: Left;   IRRIGATION AND DEBRIDEMENT FOOT Left 02/24/2020   Procedure: IRRIGATION AND DEBRIDEMENT FOOT;  Surgeon: Rosetta Posner, DPM;  Location: [facility];  Service: Podiatry;  Laterality: Left;   KNEE ARTHROSCOPY     LEFT HEART CATH AND CORS/GRAFTS ANGIOGRAPHY [name] 02/02/2020   Procedure: LEFT HEART CATH AND CORS/GRAFTS ANGIOGRAPHY;  Surgeon: Dalia Heading, MD;  Location: [facility];  Service: Cardiovascular;  Laterality: [name];   LOWER EXTREMITY ANGIOGRAPHY Left 02/25/2020   Procedure: Lower Extremity Angiography;  Surgeon: Annice Needy, MD;  Location: [facility];  Service: Cardiovascular;  Laterality: Left;   LOWER EXTREMITY ANGIOGRAPHY Left 01/04/2021   Procedure: LOWER EXTREMITY ANGIOGRAPHY;  Surgeon: Annice Needy, MD;  Location: [facility];  Service: Cardiovascular;  Laterality: Left;   LOWER EXTREMITY ANGIOGRAPHY Right 05/21/2022   Procedure: Lower Extremity Angiography;  Surgeon: Annice Needy, MD;  Location: [facility];  Service: Cardiovascular;  Laterality: Right;   METATARSAL HEAD EXCISION Left 01/10/2021   Procedure: METATARSAL HEAD EXCISION - LEFT 5th;  Surgeon: Rosetta Posner, DPM;  Location: [facility];  Service: Podiatry;  Laterality: Left;   PERIPHERAL VASCULAR CATHETERIZATION Right 01/24/2016   Procedure: Lower Extremity Angiography;  Surgeon: Renford Dills, MD;  Location: [facility];  Service: Cardiovascular;  Laterality: Right;   PERIPHERAL VASCULAR CATHETERIZATION Right 01/25/2016   Procedure: Lower Extremity Angiography;  Surgeon: Renford Dills, MD;  Location: [facility];  Service:  Cardiovascular;  Laterality: Right;   PORTACATH PLACEMENT [name] 03/18/2023   Procedure: INSERTION PORT-A-CATH;  Surgeon: Carolan Shiver, MD;  Location: [facility];  Service: General;  Laterality: [name];   TOE AMPUTATION     TONSILLECTOMY      Home Medications:  Current Meds  Medication Sig   acetaminophen (TYLENOL) 325 MG tablet Place 2 tablets (650 mg total) into feeding tube every 6 (six) hours as needed for mild pain (pain score 1-3) (or Fever >/= 101).   amiodarone (PACERONE) 200 MG tablet Place 1 tablet (200 mg total) into feeding tube daily.   apixaban (ELIQUIS) 5 MG TABS tablet Place 1 tablet (5 mg total) into feeding tube 2 (two) times daily.   ascorbic acid (VITAMIN  C) 500 MG tablet Place 1 tablet (500 mg total) into feeding tube daily.   aspirin (ASPIRIN CHILDRENS) 81 MG chewable tablet Place 1 tablet (81 mg total) into feeding tube daily.   Cholecalciferol 25 MCG (1000 UT) tablet Place 1 tablet (1,000 Units total) into feeding tube daily. (Patient taking differently: Place 2,000 Units into feeding tube daily.)   clonazePAM (KLONOPIN) 0.5 MG tablet Place 1 tablet (0.5 mg total) into feeding tube 2 (two) times daily.   Cyanocobalamin (VITAMIN B-12) 5000 MCG TBDP Place 10,000 mcg into feeding tube daily.   diltiazem (CARDIZEM CD) 300 MG 24 hr capsule Take 300 mg by mouth daily.   diltiazem (CARDIZEM) 30 MG tablet Place 2.5 tablets (75 mg total) into feeding tube 4 (four) times daily.   ezetimibe (ZETIA) 10 MG tablet Place 1 tablet (10 mg total) into feeding tube at bedtime.   ferrous sulfate 300 (60 Fe) MG/5ML syrup Place 5 mLs (300 mg total) into feeding tube daily. (Patient taking differently: Place 6.8 mLs into feeding tube daily.)   finasteride (PROSCAR) 5 MG tablet Take 1 tablet by mouth daily.   furosemide (LASIX) 20 MG tablet Take 40 mg by mouth daily.   gabapentin (NEURONTIN) 300 MG capsule Place 1 capsule (300 mg total) into feeding tube at bedtime.   HUMALOG 100 UNIT/ML injection Inject 15 Units into the skin 3 (three) times daily with meals. >200   Infant Care Products (DERMACLOUD) OINT Apply 1 Application topically daily as needed.   insulin glargine (LANTUS) 100 UNIT/ML injection Inject 0.2 mLs (20 Units total) into the skin 2 (two) times daily. Inject 15 units into the skin daily in AM and inject 25 units into the skin daily in PM (Patient taking differently: Inject 50 Units into the skin 2 (two) times daily.)   IRON SUPPLEMENT 220 (44 Fe) MG/5ML SOLN Take 5 mLs by mouth daily.   isosorbide mononitrate (IMDUR) 60 MG 24 hr tablet Take 60 mg by mouth in the morning and at bedtime.   levothyroxine (SYNTHROID) 25 MCG tablet Place 1 tablet (25 mcg  total) into feeding tube daily at 6 (six) AM.   lidocaine-prilocaine (EMLA) cream Apply on the port. 30 -45 min  prior to port access.   losartan (COZAAR) 50 MG tablet Take 1 tablet by mouth daily.   Multiple Vitamins-Minerals (MULTIVITAMIN WITH MINERALS) tablet Take 1 tablet by mouth daily.   mupirocin ointment (BACTROBAN) 2 % Apply 1 Application topically daily. (Patient taking differently: Apply 1 Application topically 3 (three) times daily. On stump)   nitrofurantoin, macrocrystal-monohydrate, (MACROBID) 100 MG capsule Place 1 capsule (100 mg total) into feeding tube daily.   nitroGLYCERIN (NITROSTAT) 0.4 MG SL tablet Place 1 tablet (0.4 mg total) under the tongue every  5 (five) minutes as needed for chest pain.   nystatin (MYCOSTATIN/NYSTOP) powder Apply 1 Application topically 2 (two) times daily. Under belly   ondansetron (ZOFRAN) 4 MG tablet Place 1 tablet (4 mg total) into feeding tube every 6 (six) hours as needed for nausea.   pramipexole (MIRAPEX) 1 MG tablet Place 2 tablets (2 mg total) into feeding tube at bedtime.   primidone (MYSOLINE) 250 MG tablet Place 1 tablet (250 mg total) into feeding tube 2 (two) times daily.   tamsulosin (FLOMAX) 0.4 MG CAPS capsule Take 0.4 mg by mouth.   Vitamins A & D (VITAMIN A & D) ointment Apply 1 Application topically as needed for dry skin. Apply to stumps   zinc sulfate, 50mg  elemental zinc, 220 (50 Zn) MG capsule Place 1 capsule (220 mg total) into feeding tube daily.    Allergies:  Allergies  Allergen Reactions   Penicillins Other (See Comments)    Happened at 67 years old and pt. stated he passed out  He has tolerated amoxicillin/clavulanate and ampicillin/sulbactam   Statins     Other reaction(s): Muscle Pain Causes legs to ache per pt   Metformin And Related Diarrhea    Family History  Problem Relation Age of Onset   Hypertension Father    Coronary artery disease Father    Alcohol abuse Father    Heart failure Brother      Social History:  reports that he quit smoking about 7 years ago. His smoking use included cigarettes. He started smoking about 53 years ago. He has a 22.5 pack-year smoking history. He has never used smokeless tobacco. He reports that he does not currently use alcohol after a past usage of about 3.0 standard drinks of alcohol per week. He reports that he does not use drugs.  ROS: A complete review of systems was performed.  All systems are negative except for pertinent findings as noted.  Physical Exam:  Vital signs in last 24 hours: Temp:  [98 F (36.7 C)-98.8 F (37.1 C)] 98.8 F (37.1 C) (11/28 1249) Pulse Rate:  [95-106] 103 (11/28 1249) Resp:  [13-28] 16 (11/28 1200) BP: (73-116)/(45-69) 104/64 (11/28 1249) SpO2:  [92 %-100 %] 98 % (11/28 1249) Constitutional:  Alert and oriented, No acute distress HEENT: [place] AT, moist mucus membranes.  Trachea midline, no masses GI: Abdomen is soft, nontender, nondistended, no abdominal masses GU: Buried penis, foley in place draining clear yellow urine. Skin: No rashes, bruises or suspicious lesions Neurologic: Grossly intact, no focal deficits.  LE absent bilaterally.   Psychiatric: Normal mood and affect   Laboratory Data:  Recent Labs    03/28/23 0011  WBC 10.6*  HGB 11.8*  HCT 37.7*   Recent Labs    03/28/23 0011 03/28/23 0240 03/28/23 1035  NA 133* 133*  --   K 6.5* 6.2* 6.7*  CL 94* 92*  --   CO2 30 30  --   GLUCOSE 252* 171*  --   BUN 61* 62*  --   CREATININE 1.71* 1.74*  --   CALCIUM 8.6* 8.8*  --    Recent Labs    03/28/23 0011  INR 1.4*   No results for input(s): "LABURIN" in the last 72 hours. Results for orders placed or performed during the hospital encounter of 03/28/23  Culture, blood (Routine x 2)     Status: None (Preliminary result)   Collection Time: 03/28/23 12:11 AM   Specimen: BLOOD  Result Value Ref Range Status   Specimen  Description BLOOD RIGHT FOREARM  Final   Special Requests   Final     BOTTLES DRAWN AEROBIC AND ANAEROBIC Blood Culture results may not be optimal due to an inadequate volume of blood received in culture bottles   Culture   Final    NO GROWTH < 12 HOURS Performed at [facility], [address], [place], [place] [address]    Report Status PENDING  Incomplete  Culture, blood (Routine x 2)     Status: None (Preliminary result)   Collection Time: 03/28/23  2:56 AM   Specimen: BLOOD  Result Value Ref Range Status   Specimen Description BLOOD LEFT ARM  Final   Special Requests   Final    BOTTLES DRAWN AEROBIC AND ANAEROBIC Blood Culture results may not be optimal due to an inadequate volume of blood received in culture bottles   Culture   Final    NO GROWTH < 12 HOURS Performed at [facility], [address], [place], [place] [address]    Report Status PENDING  Incomplete   Component     Latest Ref Rng 03/28/2023  Color, Urine     YELLOW  YELLOW !   Appearance     CLEAR  CLEAR !   Specific Gravity, Urine     1.005 - 1.030  1.012   pH     5.0 - 8.0  7.0   Glucose, UA     NEGATIVE mg/dL NEGATIVE   Hgb urine dipstick     NEGATIVE  NEGATIVE   Bilirubin Urine     NEGATIVE  NEGATIVE   Ketones, ur     NEGATIVE mg/dL NEGATIVE   Protein     NEGATIVE mg/dL 30 !   Nitrite     NEGATIVE  NEGATIVE   Leukocytes,Ua     NEGATIVE  NEGATIVE   WBC, UA     0 - 5 WBC/hpf 0-5   Bacteria, UA     NONE SEEN  NONE SEEN   Squamous Epithelial / HPF     0 - 5 /HPF 0   RBC / HPF     0 - 5 RBC/hpf 0   Specimen Source URINE, RANDOM (C)    Legend: ! Abnormal (C) Corrected  Radiologic Imaging: CT ABDOMEN PELVIS W CONTRAST  Result Date: 03/28/2023 CLINICAL DATA:  67 year old male with history of diarrhea. History of G-tube placed on 03/18/2023. History of esophageal neoplasm. * Tracking Code: BO * EXAM: CT ABDOMEN AND PELVIS WITH CONTRAST TECHNIQUE: Multidetector CT imaging of the abdomen and pelvis was performed using the standard protocol  following bolus administration of intravenous contrast. RADIATION DOSE REDUCTION: This exam was performed according to the departmental dose-optimization program which includes automated exposure control, adjustment of the mA and/or kV according to patient size and/or use of iterative reconstruction technique. CONTRAST:  80mL OMNIPAQUE IOHEXOL 300 MG/ML  SOLN COMPARISON:  CT of the abdomen and pelvis 03/15/2023. FINDINGS: Lower chest: Circumferential thickening of the distal esophagus again noted, with similar irregularity just above the level of the gastroesophageal junction. Atherosclerotic calcifications in the right coronary artery. Mild scarring in the lung bases bilaterally. Hepatobiliary: No suspicious cystic or solid hepatic lesions. No intra or extrahepatic biliary ductal dilatation. Gallbladder is nearly completely decompressed, but otherwise unremarkable in appearance. Pancreas: No pancreatic mass. No pancreatic ductal dilatation. No pancreatic or peripancreatic fluid collections or inflammatory changes. Spleen: Small capsular calcification associated with the medial aspect of the spleen, likely related to remote trauma. Otherwise,  unremarkable. Adrenals/Urinary Tract: Right kidney appears distorted secondary to enlarging perinephric fluid collection (discussed below). 1.3 cm low-attenuation lesion in the lower pole of the right kidney and 1.8 cm exophytic low-attenuation lesion in the posterior aspect of the interpolar region of the left kidney are compatible with simple cysts (no imaging follow-up recommended). Bilateral adrenal glands are normal in appearance. No hydroureteronephrosis. Urinary bladder is unremarkable in appearance. Stomach/Bowel: Percutaneous gastrostomy tube noted with retention tip in the distal body of the stomach. Trace amount of gas adjacent to this along the greater curvature of the stomach. Stomach is otherwise unremarkable in appearance. No pathologic dilatation of small bowel  or colon. Numerous colonic diverticula are noted, without surrounding inflammatory changes to suggest an acute diverticulitis at this time. Normal appendix. Vascular/Lymphatic: Atherosclerosis in the abdominal aorta and pelvic vasculature. No lymphadenopathy noted in the abdomen or pelvis. Reproductive: Prostate gland and seminal vesicles are unremarkable in appearance. Other: Substantial enlargement of an infiltrative appearing right-sided perinephric fluid collection which extends caudally along the right side of the retroperitoneum intimately associated with the right quadratus lumborum musculature in the right psoas musculature. This is irregular in shape and therefore difficult to measure accurately, but is best visualized on axial image 42 of series 2 where this currently measures up to 11.5 x 5.9 cm. This is heterogeneous in attenuation, but demonstrates peripheral enhancement with surrounding inflammatory changes, concerning for enlarging abscess. No significant volume of ascites. Trace amount of pneumoperitoneum most evident in the upper abdomen. Musculoskeletal: There are no aggressive appearing lytic or blastic lesions noted in the visualized portions of the skeleton. IMPRESSION: 1. Percutaneous gastrostomy tube retention tip appears appropriately located in the stomach. There is a small volume of pneumoperitoneum, which may be iatrogenic given the recent placement of the tube, although the presence of pneumoperitoneum 10 days postprocedure is unusual. No other source for pneumoperitoneum confidently identified on today's examination. 2. Most notably, however, today's study demonstrates an enlarging right-sided perinephric fluid collection extending caudally in the right-side of the retroperitoneum, highly concerning for enlarging abscess. 3. Persistent circumferential thickening and irregularity of the distal esophagus in this patient with history of esophageal neoplasm. 4. Colonic diverticulosis without  evidence of acute diverticulitis at this time. 5. Aortic atherosclerosis, in addition to at least right coronary artery disease. Please note that although the presence of coronary artery calcium documents the presence of coronary artery disease, the severity of this disease and any potential stenosis cannot be assessed on this non-gated CT examination. Assessment for potential risk factor modification, dietary therapy or pharmacologic therapy may be warranted, if clinically indicated. 6. Additional incidental findings, as above. Electronically Signed   By: Trudie Reed M.D.   On: 03/28/2023 06:00   DG Chest 1 View  Result Date: 03/28/2023 CLINICAL DATA:  Sepsis, profuse diarrhea.  G-tube placed 03/18/2023. EXAM: CHEST  1 VIEW COMPARISON:  Radiographs 05/18/2022 and CT chest abdomen and pelvis 03/15/2023 FINDINGS: Right chest wall Port-A-Cath tip in the mid SVC. Stable cardiomegaly. Sternotomy. Pulmonary vascular congestion. Left basilar atelectasis. No pleural effusion or pneumothorax. IMPRESSION: No acute cardiopulmonary process Electronically Signed   By: Minerva Fester M.D.   On: 03/28/2023 00:28    Impression/ Plan:  Complex right perinephric collection- Chronic right perinephric hematoma which had been decreasing in size now with interval enlargement and increased perinephric inflammation and rim enhancement concerning for involvement of abscess.  This seems to be an incidental finding from which he is asymptomatic however interval enlargement is somewhat concerning.  Discussion today with Dr. Loreta Ave regarding options for conservative management versus intervention.  Agreement to aspirate the collection primarily to assess whether or not this is truly an abscess versus chronic/complex hematoma and for obtaining culture data.  In addition to this, there is now some concern for right lower pole renal mass, malignant versus lipid poor angiomyolipoma.  Ultimately, will need outpatient MRI down  the road both to monitor the collection as well as assess for underlying mass.  Discussed plan with [name] today who is agreeable.  He will be n.p.o. at midnight.  Plan was also discussed with Dr. Meriam Sprague.  2. AKI/ hyperkalemia- Urinary retention may be contributing factor.  Agree with Foley catheter for the time being, can arrange for outpatient voiding trial.  Suspect this is also a chronic issue.  3.  Urinary retention- See above  03/28/2023, 2:20 PM  Vanna Scotland,  MD

## 2023-03-28 NOTE — ED Notes (Signed)
CCMD contacted for monitoring

## 2023-03-28 NOTE — Progress Notes (Signed)
    IR Note  Request made for IR to evaluate for asp/drain of perinephric fluid collection.  Imaging reviewed with Dr Loreta Ave He approves Rt perinephric fluid collection aspiration 11/29 in IR. IR APP will see pt in am

## 2023-03-28 NOTE — ED Notes (Signed)
Pt unable to void, 2 attempts. In and out cath performed, 1100 mL

## 2023-03-28 NOTE — ED Notes (Signed)
Pt repeatedly turning on side and laying on bed rail, repositioned multiple times. Pt reports comfortable in that position and wants to lay against bed rail. Declines to be repositioned again.

## 2023-03-28 NOTE — H&P (Signed)
History and Physical    Patient: [name]. ZOX:[id number] DOB: 28-Nov-1955 DOA: 03/28/2023 DOS: the patient was seen and examined on 03/28/2023 PCP: [name], MD  Patient coming from: Home  Chief Complaint: Diarrhea Chief Complaint  Patient presents with   Diarrhea   HPI: [name]. is a 67 y.o. male with medical history significant of atrial fibrillation, asthma, congestive heart failure, COPD, coronary artery disease, depression, diabetes mellitus, gout, hyperlipidemia, hypertension, hypogonadism in male, morbid obesity, peripheral vascular disease, OSA on CPAP at night, esophageal cancer status post chemo and G-tube placement.  According to patient family brought him in as he has been having generalized weakness with diarrhea.  He denies abdominal pain, chest pain, nausea vomiting, cough or urinary complaints. ED course Vitals on arrival temperature 98.3, respiratory rate 16, pulse 102, BP 86/57. Labs were significant for hyperkalemia 6.5 creatinine 1.7. CT scan of the abdomen showed findings concerning for perinephric fluid collection concerning for abscess, this was discussed with urologist who believes this is fairly chronic however interventional radiologist is consulted for possible aspiration and culture.  Urinalysis nonsuggestive of UTI.  Chest x-ray did not show any acute cardiopulmonary process.  Given all above concerns hospitalist service was contacted for admission for further management Review of Systems: As mentioned in the history of present illness. All other systems reviewed and are negative. Past Medical History:  Diagnosis Date   Arrhythmia    atrial fibrillation   Asthma    CHF (congestive heart failure) (HCC)    COPD (chronic obstructive pulmonary disease) (HCC)    Coronary artery disease    Depression    Diabetes mellitus without complication (HCC)    Gout    History anabolic steroid use    Hyperlipidemia    Hypertension     Hypogonadism in male    MI (myocardial infarction) (HCC)    Morbid obesity (HCC)    Myocardial infarction (HCC)    Peripheral vascular disease (HCC)    Perirectal abscess    Pleurisy    Sleep apnea    CPAP at night, no oxygen   Varicella    Past Surgical History:  Procedure Laterality Date   ABDOMINAL AORTIC ANEURYSM REPAIR     ACHILLES TENDON SURGERY Left 01/10/2021   Procedure: ACHILLES LENGTHENING/KIDNER;  Surgeon: Rosetta Posner, DPM;  Location: [facility];  Service: Podiatry;  Laterality: Left;   AMPUTATION Left 10/03/2021   Procedure: AMPUTATION BELOW KNEE;  Surgeon: Renford Dills, MD;  Location: [facility];  Service: Vascular;  Laterality: Left;   AMPUTATION Right 05/24/2022   Procedure: AMPUTATION BELOW KNEE;  Surgeon: Annice Needy, MD;  Location: [facility];  Service: General;  Laterality: Right;   AMPUTATION TOE Right 02/10/2016   Procedure: AMPUTATION TOE 3RD TOE;  Surgeon: Gwyneth Revels, DPM;  Location: [facility];  Service: Podiatry;  Laterality: Right;   AMPUTATION TOE Left 02/24/2020   Procedure: AMPUTATION TOE;  Surgeon: Rosetta Posner, DPM;  Location: [facility];  Service: Podiatry;  Laterality: Left;   APPLICATION OF WOUND VAC Left 02/29/2020   Procedure: APPLICATION OF WOUND VAC;  Surgeon: Rosetta Posner, DPM;  Location: [facility];  Service: Podiatry;  Laterality: Left;   BIOPSY  03/15/2023   Procedure: BIOPSY;  Surgeon: Wyline Mood, MD;  Location: [facility] ENDOSCOPY;  Service: Gastroenterology;;   CARDIAC CATHETERIZATION     CARDIOVERSION [name] 02/13/2022   Procedure: CARDIOVERSION;  Surgeon: Lamar Blinks, MD;  Location: [facility];  Service: Cardiovascular;  Laterality:  [name];   COLONOSCOPY WITH PROPOFOL [name] 11/18/2015   Procedure: COLONOSCOPY WITH PROPOFOL;  Surgeon: Scot Jun, MD;  Location: [facility] ENDOSCOPY;  Service: Endoscopy;  Laterality: [name];   CORONARY ARTERY BYPASS GRAFT     CORONARY STENT INTERVENTION [name] 02/02/2020   Procedure: CORONARY STENT INTERVENTION;   Surgeon: Marcina Millard, MD;  Location: [facility];  Service: Cardiovascular;  Laterality: [name];   ESOPHAGOGASTRODUODENOSCOPY (EGD) WITH PROPOFOL [name] 03/15/2023   Procedure: ESOPHAGOGASTRODUODENOSCOPY (EGD) WITH PROPOFOL;  Surgeon: Wyline Mood, MD;  Location: [facility] ENDOSCOPY;  Service: Gastroenterology;  Laterality: [name];   GASTROSTOMY [name] 03/18/2023   Procedure: INSERTION OF GASTROSTOMY TUBE;  Surgeon: Carolan Shiver, MD;  Location: [facility];  Service: General;  Laterality: [name];   IMPACTION REMOVAL  03/15/2023   Procedure: IMPACTION REMOVAL;  Surgeon: Wyline Mood, MD;  Location: [facility] ENDOSCOPY;  Service: Gastroenterology;;   INCISION AND DRAINAGE Left 08/07/2021   Procedure: INCISION AND DRAINAGE-Partial Calcanectomy;  Surgeon: Rosetta Posner, DPM;  Location: [facility];  Service: Podiatry;  Laterality: Left;   IRRIGATION AND DEBRIDEMENT FOOT Left 02/29/2020   Procedure: IRRIGATION AND DEBRIDEMENT FOOT;  Surgeon: Rosetta Posner, DPM;  Location: [facility];  Service: Podiatry;  Laterality: Left;   IRRIGATION AND DEBRIDEMENT FOOT Left 02/24/2020   Procedure: IRRIGATION AND DEBRIDEMENT FOOT;  Surgeon: Rosetta Posner, DPM;  Location: [facility];  Service: Podiatry;  Laterality: Left;   KNEE ARTHROSCOPY     LEFT HEART CATH AND CORS/GRAFTS ANGIOGRAPHY [name] 02/02/2020   Procedure: LEFT HEART CATH AND CORS/GRAFTS ANGIOGRAPHY;  Surgeon: Dalia Heading, MD;  Location: [facility];  Service: Cardiovascular;  Laterality: [name];   LOWER EXTREMITY ANGIOGRAPHY Left 02/25/2020   Procedure: Lower Extremity Angiography;  Surgeon: Annice Needy, MD;  Location: [facility];  Service: Cardiovascular;  Laterality: Left;   LOWER EXTREMITY ANGIOGRAPHY Left 01/04/2021   Procedure: LOWER EXTREMITY ANGIOGRAPHY;  Surgeon: Annice Needy, MD;  Location: [facility];  Service: Cardiovascular;  Laterality: Left;   LOWER EXTREMITY ANGIOGRAPHY Right 05/21/2022   Procedure: Lower Extremity  Angiography;  Surgeon: Annice Needy, MD;  Location: [facility];  Service: Cardiovascular;  Laterality: Right;   METATARSAL HEAD EXCISION Left 01/10/2021   Procedure: METATARSAL HEAD EXCISION - LEFT 5th;  Surgeon: Rosetta Posner, DPM;  Location: [facility];  Service: Podiatry;  Laterality: Left;   PERIPHERAL VASCULAR CATHETERIZATION Right 01/24/2016   Procedure: Lower Extremity Angiography;  Surgeon: Renford Dills, MD;  Location: [facility];  Service: Cardiovascular;  Laterality: Right;   PERIPHERAL VASCULAR CATHETERIZATION Right 01/25/2016   Procedure: Lower Extremity Angiography;  Surgeon: Renford Dills, MD;  Location: [facility];  Service: Cardiovascular;  Laterality: Right;   PORTACATH PLACEMENT [name] 03/18/2023   Procedure: INSERTION PORT-A-CATH;  Surgeon: Carolan Shiver, MD;  Location: [facility];  Service: General;  Laterality: [name];   TOE AMPUTATION     TONSILLECTOMY     Social History:  reports that he quit smoking about 7 years ago. His smoking use included cigarettes. He started smoking about 53 years ago. He has a 22.5 pack-year smoking history. He has never used smokeless tobacco. He reports that he does not currently use alcohol after a past usage of about 3.0 standard drinks of alcohol per week. He reports that he does not use drugs.  Allergies  Allergen Reactions   Penicillins Other (See Comments)    Happened at 67 years old and pt. stated he passed out  He has tolerated amoxicillin/clavulanate and ampicillin/sulbactam   Statins     Other reaction(s): Muscle Pain Causes legs to ache per pt   Metformin And Related Diarrhea    Family History  Problem Relation Age of Onset   Hypertension Father    Coronary artery disease Father    Alcohol abuse Father    Heart failure Brother     Prior to Admission medications   Medication Sig Start Date End Date Taking? Authorizing Provider  furosemide (LASIX) 20 MG tablet Take 40 mg by mouth daily.  02/25/23  Yes [provider]  IRON SUPPLEMENT 220 (44 Fe) MG/5ML SOLN Take 5 mLs by mouth daily. 03/23/23  Yes [provider]  losartan (COZAAR) 50 MG tablet Take 1 tablet by mouth daily. 03/04/23  Yes [provider]  acetaminophen (TYLENOL) 325 MG tablet Place 2 tablets (650 mg total) into feeding tube every 6 (six) hours as needed for mild pain (pain score 1-3) (or Fever >/= 101). 03/22/23   Lurene Shadow, MD  amiodarone (PACERONE) 200 MG tablet Place 1 tablet (200 mg total) into feeding tube daily. 03/22/23   Lurene Shadow, MD  apixaban (ELIQUIS) 5 MG TABS tablet Place 1 tablet (5 mg total) into feeding tube 2 (two) times daily. 03/22/23   Lurene Shadow, MD  ascorbic acid (VITAMIN C) 500 MG tablet Place 1 tablet (500 mg total) into feeding tube daily. 03/22/23   Lurene Shadow, MD  aspirin (ASPIRIN CHILDRENS) 81 MG chewable tablet Place 1 tablet (81 mg total) into feeding tube daily. 03/22/23   Lurene Shadow, MD  budesonide (PULMICORT) 0.5 MG/2ML nebulizer solution Take 2 mLs (0.5 mg total) by nebulization 2 (two) times daily. 11/10/21   Sunnie Nielsen, DO  Cholecalciferol 25 MCG (1000 UT) tablet Place 1 tablet (1,000 Units total) into feeding tube daily. 03/22/23   Lurene Shadow, MD  clonazePAM (KLONOPIN) 0.5 MG tablet Place 1 tablet (0.5 mg total) into feeding tube 2 (two) times daily. 03/22/23   Lurene Shadow, MD  Continuous Blood Gluc Sensor (FREESTYLE LIBRE 3 SENSOR) MISC  06/13/22   [provider]  Continuous Glucose Sensor (FREESTYLE LIBRE 3 SENSOR) MISC Use 1 sensor every 14 (fourteen) days. 01/10/23     Cyanocobalamin (VITAMIN B-12) 5000 MCG TBDP Place 10,000 mcg into feeding tube daily. 03/22/23   Lurene Shadow, MD  diltiazem (CARDIZEM) 30 MG tablet Place 2.5 tablets (75 mg total) into feeding tube 4 (four) times daily. 03/21/23   Lurene Shadow, MD  ezetimibe (ZETIA) 10 MG tablet Place 1 tablet (10 mg total) into feeding tube at bedtime.  03/22/23   Lurene Shadow, MD  ferrous sulfate 300 (60 Fe) MG/5ML syrup Place 5 mLs (300 mg total) into feeding tube daily. 03/22/23   Lurene Shadow, MD  ferrous sulfate 325 (65 FE) MG tablet Take 325 mg by mouth daily with breakfast.    [provider]  furosemide (LASIX) 20 MG tablet Take 1 tablet (20 mg total) by mouth ONCE OR TWICE daily (total daily dose maximum 40 mg)  via feeding tube as needed for increased swelling, shortness of breath, weight gain 5+ lbs over 1-2 days. Seek medical care if these symptoms are not improving with increased dose, or if you become dizzy or have low blood pressure. 03/22/23   Lurene Shadow, MD  gabapentin (NEURONTIN) 300 MG capsule Place 1 capsule (300 mg total) into feeding tube at bedtime. 03/22/23   Lurene Shadow, MD  HUMALOG 100 UNIT/ML injection Inject 15 Units into  the skin 3 (three) times daily with meals. >200 02/05/22   [provider]  Infant Care Products [facility]) OINT Apply 1 Application topically daily as needed.    [provider]  insulin glargine (LANTUS) 100 UNIT/ML injection Inject 0.2 mLs (20 Units total) into the skin 2 (two) times daily. Inject 15 units into the skin daily in AM and inject 25 units into the skin daily in PM 03/22/23   Lurene Shadow, MD  levothyroxine (SYNTHROID) 25 MCG tablet Place 1 tablet (25 mcg total) into feeding tube daily at 6 (six) AM. 03/22/23   Lurene Shadow, MD  lidocaine-prilocaine (EMLA) cream Apply on the port. 30 -45 min  prior to port access. 03/20/23   [name], MD  Multiple Vitamins-Minerals (MULTIVITAMIN WITH MINERALS) tablet Take 1 tablet by mouth daily.    [provider]  mupirocin ointment (BACTROBAN) 2 % Apply 1 Application topically daily. 12/13/22   Georgiana Spinner, NP  nitrofurantoin, macrocrystal-monohydrate, (MACROBID) 100 MG capsule Place 1 capsule (100 mg total) into feeding tube daily. 03/22/23   Lurene Shadow, MD  nitroGLYCERIN (NITROSTAT)  0.4 MG SL tablet Place 1 tablet (0.4 mg total) under the tongue every 5 (five) minutes as needed for chest pain. 06/20/22   [name], MD  nutrition supplement, JUVEN, (JUVEN) PACK Place 1 packet into feeding tube 2 (two) times daily between meals. 03/22/23   Lurene Shadow, MD  Nutritional Supplements (FEEDING SUPPLEMENT, OSMOLITE 1.5 CAL,) LIQD Place 474 mLs into feeding tube 4 (four) times daily -  before meals and at bedtime. 03/22/23   Lurene Shadow, MD  nystatin (MYCOSTATIN/NYSTOP) powder Apply 1 Application topically 2 (two) times daily.    [provider]  ondansetron (ZOFRAN) 4 MG tablet Place 1 tablet (4 mg total) into feeding tube every 6 (six) hours as needed for nausea. 03/22/23   Lurene Shadow, MD  pramipexole (MIRAPEX) 1 MG tablet Place 2 tablets (2 mg total) into feeding tube at bedtime. 03/22/23   Lurene Shadow, MD  primidone (MYSOLINE) 250 MG tablet Place 1 tablet (250 mg total) into feeding tube 2 (two) times daily. 03/22/23   Lurene Shadow, MD  zinc sulfate, 50mg  elemental zinc, 220 (50 Zn) MG capsule Place 1 capsule (220 mg total) into feeding tube daily. 03/22/23   Lurene Shadow, MD    Physical Exam:    General: He is not in acute distress morbidly obese, appears lethargic. HENT:     Head: Normocephalic and atraumatic.     Nose: Nose normal.     Mouth/Throat:     Mouth: Mucous membranes are moist.  Eyes:     Pupils: Pupils are equal, round, and reactive to light.  Cardiovascular:     Rate and Rhythm: Normal rate and regular rhythm.  Pulmonary:     Effort: Pulmonary effort is normal.  Abdominal: G-tube in place    General: Bowel sounds are normal.  Musculoskeletal: Bilateral below-knee amputation Skin:    General: Skin is warm.  Neurological: Awake but drowsy easily falls asleep but able to engage in conversation  Vitals:   03/28/23 0843 03/28/23 0900 03/28/23 0915 03/28/23 0930  BP: (!) 104/57 (!) 100/59 105/61 107/66  Pulse: 97 95 96 96   Resp: 16 18 19 19   Temp: 98 F (36.7 C)     TempSrc:      SpO2: 99% 99% 99% 99%    Data Reviewed: Have reviewed patient abdominal CT scan with results as noted above  Latest Ref Rng & Units 03/28/2023   10:35 AM 03/28/2023    2:40 AM 03/28/2023   12:11 AM  BMP  Glucose 70 - 99 mg/dL  161  096   BUN 8 - 23 mg/dL  62  61   Creatinine 0.45 - 1.24 mg/dL  4.09  8.11   Sodium 914 - 145 mmol/L  133  133   Potassium 3.5 - 5.1 mmol/L 6.7  6.2  6.5   Chloride 98 - 111 mmol/L  92  94   CO2 22 - 32 mmol/L  30  30   Calcium 8.9 - 10.3 mg/dL  8.8  8.6        Latest Ref Rng & Units 03/28/2023   12:11 AM 03/15/2023    4:02 AM 03/14/2023    4:18 PM  CBC  WBC 4.0 - 10.5 K/uL 10.6  6.7  7.3   Hemoglobin 13.0 - 17.0 g/dL 78.2  95.6  21.3   Hematocrit 39.0 - 52.0 % 37.7  36.1  42.4   Platelets 150 - 400 K/uL 240  160  170      Assessment and Plan:  Acute kidney injury secondary to dehydration Severe dehydration Continue IV fluid management Monitor electrolytes closely Monitor renal function closely Avoid nephrotoxic medications Renally dose all drugs  Possible right perinephric abscess Continue Zosyn Interventional radiology on board for aspiration tomorrow Keep n.p.o. after midnight Urologist also on board and case discussed I will keep on Zosyn for now  Severe life-threatening hyperkalemia Patient presented with elevated potassium and received hyperkalemia cocktail in the emergency room I have again ordered hyperkalemia cocktail given repeat potassium still elevated Patient being kept on Lokelma  Acute diarrhea likely secondary to recent initiation of tube feeding We will rule out infectious cause GI panel currently pending  Chronic persistent atrial fibrillation Takes Eliquis at home Hold Eliquis given planned IR intervention tomorrow Continue amiodarone  Chronic diastolic congestive heart failure Recent EF of 55% on echo obtained on November 2024 Monitor input  and output Daily weighing Holding other antihypertensives at this time on account of relative hypotension  COPD Continue as needed nebulization   Coronary artery disease,  Continue aspirin therapy Holding other antihypertensives at this time on account of hypotension  Diabetes mellitus type 2 with hyperglycemia Continue insulin therapy Monitor glucose levels  Hypothyroidism Continue levothyroxine therapy  Essential hypertension Holding home antihypertensives  Morbid obesity We will consult weight loss when medically stable  Peripheral vascular disease Continue aspirin  Patient is allergic to statin therapy  OSA on CPAP at night,  CPAP at night  Esophageal cancer status post chemo and G-tube placement. Dietitian consulted for resumption of tube feeding    Advance Care Planning:   Code Status: Prior full code  Consults: Interventional radiology, urology  Family Communication: None present at bedside  Severity of Illness: The appropriate patient status for this patient is INPATIENT. Inpatient status is judged to be reasonable and necessary in order to provide the required intensity of service to ensure the patient's safety. The patient's presenting symptoms, physical exam findings, and initial radiographic and laboratory data in the context of their chronic comorbidities is felt to place them at high risk for further clinical deterioration. Furthermore, it is not anticipated that the patient will be medically stable for discharge from the hospital within 2 midnights of admission.   * I certify that at the point of admission it is my clinical judgment that the patient will require inpatient hospital care  spanning beyond 2 midnights from the point of admission due to high intensity of service, high risk for further deterioration and high frequency of surveillance required.*  Author: Loyce Dys, MD 03/28/2023 9:55 AM  For on call review [email or web address].

## 2023-03-28 NOTE — ED Notes (Signed)
Pt repeatedly moving around in bed, pt has been repositioned multiple times. Pt repeatedly taking off bp cuff and monitors.

## 2023-03-28 NOTE — ED Notes (Signed)
Dr Meriam Sprague notified of potassium increased to 6.7

## 2023-03-28 NOTE — Progress Notes (Signed)
Pharmacy Antibiotic Note  [name]. is a 67 y.o. male w/ PMH of asthma, COPD, chronic diastolic CHF, coronary artery disease, depression, gout, type 2 diabetes mellitus, hypertension, dyslipidemia, PVD s/p bilateral BKA, OSA on CPAP, admitted on 03/28/2023 with sepsis.  Pharmacy has been consulted for Zosyn dosing.  Plan: start Zosyn 3.375g IV q8h (4 hour infusion) ---follow renal function for needed dose adjustments     Temp (24hrs), Avg:98.1 F (36.7 C), Min:98 F (36.7 C), Max:98.3 F (36.8 C)  Recent Labs  Lab 03/28/23 0011 03/28/23 0240  WBC 10.6*  --   CREATININE 1.71* 1.74*  LATICACIDVEN 1.6  --     Estimated Creatinine Clearance: 59.9 mL/min (A) (by C-G formula based on SCr of 1.74 mg/dL (H)).    Allergies  Allergen Reactions   Penicillins Other (See Comments)    Happened at 67 years old and pt. stated he passed out  He has tolerated amoxicillin/clavulanate and ampicillin/sulbactam   Statins     Other reaction(s): Muscle Pain Causes legs to ache per pt   Metformin And Related Diarrhea    Antimicrobials this admission: 11/28 Zosyn >>   Microbiology results: 11/28 BCx: pending  Thank you for allowing pharmacy to be a part of this patient's care.  [name] 03/28/2023 8:45 AM

## 2023-03-28 NOTE — ED Notes (Signed)
Wet non-productive cough noted in triage. Pt boosted in bed and positioned for comfort.

## 2023-03-28 NOTE — ED Notes (Signed)
Pt spouse called requesting update. Update provided based off chart review. All questions answered, and spouse verbalizes understanding of all discussed. Spouse is requesting that bandage to R stump be changed as she typically changes it every other day and it has been 2 days. Primary RN notified of conversation.

## 2023-03-28 NOTE — ED Provider Notes (Signed)
[facility] Provider Note    Event Date/Time   First MD Initiated Contact with Patient 03/28/23 0229     (approximate)   History   Diarrhea   HPI [name]. is a 67 y.o. male who presents by EMS for evaluation of of diarrhea and generalized weakness.  He recently was diagnosed with esophageal cancer and had a PEG tube placed as well as a port.  He is manage for his oncology care by Dr. Donneta Romberg.  Over the last 2 days he has had profuse and copious diarrhea.  This is in the setting of a recent hospitalization from which she was discharged about a week ago.  He denies any abdominal pain.  He has had no fever, chest pain, nor shortness of breath.  He has not had any nausea or vomiting, states that he is just having a lot of diarrhea.  He is on a nutritional supplement by G-tube and his family is concerned that it may be the cause of his diarrhea.     Physical Exam   Triage Vital Signs: ED Triage Vitals  Encounter Vitals Group     BP 03/27/23 2353 (!) 86/57     Systolic BP Percentile --      Diastolic BP Percentile --      Pulse Rate 03/27/23 2353 (!) 102     Resp 03/27/23 2353 16     Temp 03/27/23 2353 98.3 F (36.8 C)     Temp Source 03/27/23 2353 Oral     SpO2 03/27/23 2347 96 %     Weight --      Height --      Head Circumference --      Peak Flow --      Pain Score --      Pain Loc --      Pain Education --      Exclude from Growth Chart --     Most recent vital signs: Vitals:   03/28/23 0800 03/28/23 0803  BP: (!) 83/45 (!) 94/53  Pulse: 97 95  Resp: 17 16  Temp:    SpO2: 98% 97%    General: Awake, appears chronically ill but not in severe distress. CV:  Good peripheral perfusion.  Regular rate and rhythm. Resp:  Normal effort. Speaking easily and comfortably, no accessory muscle usage nor intercostal retractions.  Using his baseline supplementary oxygen by nasal cannula.  Lungs are clear to auscultation but examination is  limited by body habitus. Abd:  No distention.  Morbid obesity.  No tenderness to palpation.  PEG tube present. Other:  Patient is status post BKA bilaterally.   ED Results / Procedures / Treatments   Labs (all labs ordered are listed, but only abnormal results are displayed) Labs Reviewed  COMPREHENSIVE METABOLIC PANEL - Abnormal; Notable for the following components:      Result Value   Sodium 133 (*)    Potassium 6.5 (*)    Chloride 94 (*)    Glucose, Bld 252 (*)    BUN 61 (*)    Creatinine, Ser 1.71 (*)    Calcium 8.6 (*)    Albumin 2.8 (*)    AST 14 (*)    GFR, Estimated 43 (*)    All other components within normal limits  CBC WITH DIFFERENTIAL/PLATELET - Abnormal; Notable for the following components:   WBC 10.6 (*)    RBC 3.99 (*)    Hemoglobin 11.8 (*)  HCT 37.7 (*)    Neutro Abs 8.0 (*)    All other components within normal limits  PROTIME-INR - Abnormal; Notable for the following components:   Prothrombin Time 17.1 (*)    INR 1.4 (*)    All other components within normal limits  URINALYSIS, W/ REFLEX TO CULTURE (INFECTION SUSPECTED) - Abnormal; Notable for the following components:   Color, Urine YELLOW (*)    APPearance CLEAR (*)    Protein, ur 30 (*)    All other components within normal limits  BASIC METABOLIC PANEL - Abnormal; Notable for the following components:   Sodium 133 (*)    Potassium 6.2 (*)    Chloride 92 (*)    Glucose, Bld 171 (*)    BUN 62 (*)    Creatinine, Ser 1.74 (*)    Calcium 8.8 (*)    GFR, Estimated 42 (*)    All other components within normal limits  MAGNESIUM - Abnormal; Notable for the following components:   Magnesium 2.7 (*)    All other components within normal limits  CBG MONITORING, ED - Abnormal; Notable for the following components:   Glucose-Capillary 138 (*)    All other components within normal limits  CBG MONITORING, ED - Abnormal; Notable for the following components:   Glucose-Capillary 135 (*)    All other  components within normal limits  CULTURE, BLOOD (ROUTINE X 2)  CULTURE, BLOOD (ROUTINE X 2)  C DIFFICILE QUICK SCREEN W PCR REFLEX    GASTROINTESTINAL PANEL BY PCR, STOOL (REPLACES STOOL CULTURE)  LACTIC ACID, PLASMA     EKG  ED ECG REPORT I, Loleta Rose, the attending physician, personally viewed and interpreted this ECG.  Date: 03/28/2023 EKG Time: 3:33 AM Rate: 99 Rhythm: Ectopic atrial rhythm versus sinus rhythm with missing P waves as result of electrolyte changes QRS Axis: normal Intervals: normal ST/T Wave abnormalities: Non-specific ST segment / T-wave changes, but no clear evidence of acute ischemia. Narrative Interpretation: no definitive evidence of acute ischemia; does not meet STEMI criteria.    RADIOLOGY I viewed and interpreted the patient's chest x-ray and I see no evidence of pneumonia.  I also read the radiologist's report, which confirmed no acute findings.  I also reviewed and interpret the patient's CT of the abdomen and pelvis.  See hospital course for details.   PROCEDURES:  Critical Care performed: Yes, see critical care procedure note(s)  .Critical Care  Performed by: Loleta Rose, MD Authorized by: Loleta Rose, MD   Critical care provider statement:    Critical care time (minutes):  60   Critical care time was exclusive of:  Separately billable procedures and treating other patients   Critical care was necessary to treat or prevent imminent or life-threatening deterioration of the following conditions: Hyperkalemia.   Critical care was time spent personally by me on the following activities:  Development of treatment plan with patient or surrogate, evaluation of patient's response to treatment, examination of patient, obtaining history from patient or surrogate, ordering and performing treatments and interventions, ordering and review of laboratory studies, ordering and review of radiographic studies, pulse oximetry, re-evaluation of patient's  condition and review of old charts .1-3 Lead EKG Interpretation  Performed by: Loleta Rose, MD Authorized by: Loleta Rose, MD     Interpretation: normal     ECG rate:  90   ECG rate assessment: normal     Rhythm: sinus rhythm     Ectopy: none     Conduction:  normal       IMPRESSION / MDM / ASSESSMENT AND PLAN / ED COURSE  I reviewed the triage vital signs and the nursing notes.                              Differential diagnosis includes, but is not limited to, infectious diarrhea, malabsorption, complication of PEG tube placement, electrolyte or metabolic abnormality, renal dysfunction, worsening neoplastic process.  Patient's presentation is most consistent with acute presentation with potential threat to life or bodily function.  Labs/studies ordered: Urinalysis, magnesium level, BMP, CMP, pro time-INR, lactic acid, CBC with differential, CT abdomen/pelvis, 1 view chest x-ray, EKG  Interventions/Medications given:  Medications  sodium zirconium cyclosilicate (LOKELMA) packet 10 g (10 g Oral Given 03/28/23 0511)  calcium gluconate inj 10% (1 g) URGENT USE ONLY! (1 g Intravenous Given 03/28/23 0505)  albuterol (PROVENTIL) (2.5 MG/3ML) 0.083% nebulizer solution 10 mg (10 mg Nebulization Given 03/28/23 0453)  insulin aspart (novoLOG) injection 5 Units (5 Units Intravenous Given 03/28/23 0506)  sodium chloride 0.9 % bolus 1,000 mL (0 mLs Intravenous Stopped 03/28/23 0649)  iohexol (OMNIPAQUE) 300 MG/ML solution 80 mL (80 mLs Intravenous Contrast Given 03/28/23 0541)  lactated ringers bolus 1,000 mL (1,000 mLs Intravenous New Bag/Given 03/28/23 0714)    (Note:  hospital course my include additional interventions and/or labs/studies not listed above.)   Patient's vital signs are stable.  He does not appear septic and his lab work and physical exam were not consistent with sepsis.  His initial metabolic panel showed a potassium of 6.5 which was very unexpected even in the  setting of acute kidney injury with creatinine 1.7.  I repeated a BMP and it confirmed his hyperkalemia.  This is, however, consistent with his EKG findings which suggest a loss of P waves.  I treated for hyperkalemia with medications as listed above including IV fluids.  He has not yet provided a urine specimen but we will obtain 1 by In-N-Out catheterization if necessary.  He has no abdominal tenderness to palpation but given the persistent diarrhea I will obtain a CT of the abdomen and pelvis for further evaluation, particularly given his neoplastic history and recent hospitalization and surgeries.  The patient is on the cardiac monitor to evaluate for evidence of arrhythmia and/or significant heart rate changes.   Clinical Course as of 03/28/23 0828  Thu Mar 28, 2023  [id number] Consulted with Dr. Janee Morn with the hospitalist service who will admit the patient for his hyperkalemia.  Of note, as documented above, the patient has an abnormality around the right kidney concerning for perinephric abscess.  I see no history of anything similar.  I am paging Dr. Apolinar Junes to discuss. [CF]  [phone] (Delayed documentation) I consulted Dr. Apolinar Junes by phone.  She remembers the patient from a prior similar episode where he had a retroperitoneal hematoma.  She will consult on the patient.  I updated the hospitalist team to let them know about the finding and need for a urology consult order. [CF]  0747 Urinalysis, w/ Reflex to Culture (Infection Suspected) -Urine, Clean Catch(!) Normal UA [CF]  0747 Of note, the patient had been unable to urinate overnight.  I ordered an In-N-Out catheterization and the nurse reported that 1100 mL were drained.  Given his acute urinary retention, I ordered placement of an indwelling Foley catheter. [CF]    Clinical Course User Index [CF] Loleta Rose, MD  FINAL CLINICAL IMPRESSION(S) / ED DIAGNOSES   Final diagnoses:  Hyperkalemia  Acute kidney injury (HCC)  Diarrhea,  unspecified type  Perinephric fluid collection     Rx / DC Orders   ED Discharge Orders     None        Note:  This document was prepared using Dragon voice recognition software and may include unintentional dictation errors.   Loleta Rose, MD 03/28/23 [phone]

## 2023-03-28 NOTE — ED Notes (Signed)
Pt to CT

## 2023-03-28 NOTE — ED Notes (Signed)
Dr Sammuel Bailiff regarding SBP <90 and MAP <65

## 2023-03-28 NOTE — ED Notes (Signed)
Pt changed from stool incontinence. Peri care provided. New brief and chux provided

## 2023-03-28 NOTE — ED Notes (Signed)
Pharmacy contacted for meds

## 2023-03-28 NOTE — ED Notes (Signed)
Called wife x 2 in attempt to notify her of pt placement in room. During triage she had stated that she wanted to wait in the lobby until pt went to a room, and then she would join him back there. Spouse not answering call in lobby and no answer with phone calls.

## 2023-03-29 ENCOUNTER — Other Ambulatory Visit (HOSPITAL_COMMUNITY): Payer: Self-pay

## 2023-03-29 ENCOUNTER — Telehealth (HOSPITAL_COMMUNITY): Payer: Self-pay | Admitting: Pharmacy Technician

## 2023-03-29 DIAGNOSIS — E875 Hyperkalemia: Secondary | ICD-10-CM | POA: Diagnosis not present

## 2023-03-29 LAB — GLUCOSE, CAPILLARY
Glucose-Capillary: 98 mg/dL (ref 70–99)
Glucose-Capillary: 78 mg/dL (ref 70–99)
Glucose-Capillary: 99 mg/dL (ref 70–99)

## 2023-03-29 LAB — CBC WITH DIFFERENTIAL/PLATELET
Abs Immature Granulocytes: 0.07 10*3/uL (ref 0.00–0.07)
Basophils Absolute: 0.1 10*3/uL (ref 0.0–0.1)
Basophils Relative: 1 %
Eosinophils Absolute: 0.2 10*3/uL (ref 0.0–0.5)
Eosinophils Relative: 2 %
HCT: 35.7 % — ABNORMAL LOW (ref 39.0–52.0)
Hemoglobin: 11.6 g/dL — ABNORMAL LOW (ref 13.0–17.0)
Immature Granulocytes: 1 %
Lymphocytes Relative: 8 %
Lymphs Abs: 0.8 10*3/uL (ref 0.7–4.0)
MCH: 29.5 pg (ref 26.0–34.0)
MCHC: 32.5 g/dL (ref 30.0–36.0)
MCV: 90.8 fL (ref 80.0–100.0)
Monocytes Absolute: 0.8 10*3/uL (ref 0.1–1.0)
Monocytes Relative: 8 %
Neutro Abs: 8.8 10*3/uL — ABNORMAL HIGH (ref 1.7–7.7)
Neutrophils Relative %: 80 %
Platelets: 237 10*3/uL (ref 150–400)
RBC: 3.93 MIL/uL — ABNORMAL LOW (ref 4.22–5.81)
RDW: 14.2 % (ref 11.5–15.5)
WBC: 10.8 10*3/uL — ABNORMAL HIGH (ref 4.0–10.5)
nRBC: 0 % (ref 0.0–0.2)

## 2023-03-29 LAB — BASIC METABOLIC PANEL
Anion gap: 9 (ref 5–15)
BUN: 48 mg/dL — ABNORMAL HIGH (ref 8–23)
CO2: 25 mmol/L (ref 22–32)
Calcium: 7.9 mg/dL — ABNORMAL LOW (ref 8.9–10.3)
Chloride: 99 mmol/L (ref 98–111)
Creatinine, Ser: 1.55 mg/dL — ABNORMAL HIGH (ref 0.61–1.24)
GFR, Estimated: 48 mL/min — ABNORMAL LOW (ref >60)
Glucose, Bld: 99 mg/dL (ref 70–99)
Potassium: 5.6 mmol/L — ABNORMAL HIGH (ref 3.5–5.1)
Sodium: 133 mmol/L — ABNORMAL LOW (ref 135–145)

## 2023-03-29 LAB — CBG MONITORING, ED: Glucose-Capillary: 94 mg/dL (ref 70–99)

## 2023-03-29 MED ORDER — OSMOLITE 1.2 CAL PO LIQD: 1000.0000 mL | ORAL | Status: DC

## 2023-03-29 MED ORDER — GLUCERNA 1.5 CAL PO LIQD
356.0000 mL | Freq: Three times a day (TID) | ORAL | Status: DC
  Administered 2023-03-29 – 2023-04-17 (×70): 356 mL

## 2023-03-29 MED ORDER — SODIUM CHLORIDE 0.9 % IV BOLUS
1000.0000 mL | Freq: Once | INTRAVENOUS | Status: AC
  Administered 2023-03-29: 1000 mL via INTRAVENOUS

## 2023-03-29 MED ORDER — CHLORHEXIDINE GLUCONATE CLOTH 2 % EX PADS
6.0000 | MEDICATED_PAD | Freq: Every day | CUTANEOUS | Status: DC
  Administered 2023-03-30 – 2023-04-14 (×16): 6 via TOPICAL

## 2023-03-29 MED ORDER — HEPARIN SODIUM (PORCINE) 5000 UNIT/ML IJ SOLN
5000.0000 [IU] | Freq: Three times a day (TID) | INTRAMUSCULAR | Status: DC
Start: 2023-03-29 — End: 2023-03-31
  Administered 2023-03-29 – 2023-03-31 (×5): 5000 [IU] via SUBCUTANEOUS
  Filled 2023-03-29 (×6): qty 1

## 2023-03-29 MED ORDER — FIDAXOMICIN 200 MG PO TABS
200.0000 mg | ORAL_TABLET | Freq: Two times a day (BID) | ORAL | Status: DC
  Administered 2023-03-29 – 2023-04-07 (×19): 200 mg
  Filled 2023-03-29 (×21): qty 1

## 2023-03-29 MED ORDER — FREE WATER
230.0000 mL | Freq: Three times a day (TID) | Status: DC
  Administered 2023-03-29 – 2023-04-07 (×33): 230 mL

## 2023-03-29 NOTE — Progress Notes (Signed)
Initial Nutrition Assessment  DOCUMENTATION CODES:   Obesity unspecified, Non-severe (moderate) malnutrition in context of chronic illness  INTERVENTION:   -TF via g-tube:   356 ml Glucerna 1.5 4 times daily  115 ml free water flush before and after each feeding administration  Tube feeding regimen provides 2136 kcal (100% of needs), 118 grams of protein, and 1080 ml of H2O.  Total free water: 2000 ml daily  -Allow ice chips for dry mouth per discussion with MD -Forwarded note to [facility] RD, as pt is nearing initiation of radiation treatments  NUTRITION DIAGNOSIS:   Moderate Malnutrition related to chronic illness (esopahgeal mass, likely malginant) as evidenced by mild fat depletion, mild muscle depletion.  GOAL:   Patient will meet greater than or equal to 90% of their needs  MONITOR:   TF tolerance  REASON FOR ASSESSMENT:   Consult Enteral/tube feeding initiation and management  ASSESSMENT:   Pt with medical history significant of atrial fibrillation, asthma, congestive heart failure, COPD, coronary artery disease, depression, diabetes mellitus, gout, hyperlipidemia, hypertension, hypogonadism in male, morbid obesity, peripheral vascular disease, OSA on CPAP at night, esophageal cancer status post chemo and G-tube placement.  Pt admitted with AKI secondary to dehydration.   11/15- s/p EGD- food removal in lower third of esophagus, malignant appearing esophageal stenosis, completely obstructing, esophageal tumor in lowe third of esophagus (biopsied)  11/18- s/p Stamm gastrostomy   Per urology notes, pt with possible rt perinephritic abscess; plan for possible aspiration today.    Pt lethargic at time of visit and unable to provide history. RN reports pt has been confused since arriving to the floor and unable to provide history, however, pt wife present at bedside to provide history.   Pt is familiar to this RD due to multiple hospitalizations. Pt was  recently admitted and discharged this past week; history obtained from wife and informed wife that this RD took care of pt during prior hospitalization. Pt wife expressed frustration over last hospital discharge; she shares she did not feel like she was trained adequately on TF regimen and reports she was not given instructions during discharge or from home health company regarding free water flushes and suspects that this is why pt is dehydrated. Pt wife also concerned about formula choice, as he was discharged home on formula that was not specifically made for pts with DM.   Pt wife reports decline at home, particularly since 03/26/23. Pt became very weak, started experiencing diarrhea as well as hyperglycemia. Per wife, pt also went to outpatient endocrinology appointment that day and shared that endocrinologist questioned formula choice and adjusted insulin regimen due to hyperglycemia (per wife, CBGS were often 300-400 at home since Tuesday). Due to pt being hospitalized, insulin regimen has not been started to determine effect of change.   RD reviewed with wife pt's home TF regimen that was recommended prior to discharge. Wife shares that she was concerned that pt's CBGS were high on continuous feeds, but improved when pt was transitioned to bolus feeds in the hospital. Of note, per previous RD documentation from last hospital visit, CBGS ranged from 84-241 while TF was receiving TF s/p g-tube placement. RD educated wife on factors that can cause hyperglycemia in the hospital (continuous TF, certain medications, and body's response on acute stress from infection, illness, etc). Reviewed with wife that RD collaborated with MD and DM coordinators to adjust insulin regimen while hospitalized and when transitioning to bolus feedings in attempt to optimize glycemic control  during previous hospitalization.   RD also shared with pt reasoning and decision making regarding TF formula during last admission. Wife  did confirm with RD that pt's intake was inadequate for about 2-3 weeks PTA due to swallowing difficulties and pt was able to eat mostly creamed soups and chicken broth; wife agreed with this RD that pt was not able to consume adequate nutrition during this time. Due to this, RD expressed concern over formula tolerance and refeeding risk due to weight loss and inadequate nutrition PTA and how a Osmolite (no fiber) was chosen in attempt to ensure pt would tolerate feedings given history of limited nutrition. RD also shared efforts that were done by RD to optimize glycemic control (including reaching out to optimize insulin regimen and transitioning to bolus feedings).   RD allowed pt wife to express frustrations over previous care and provided support about how care could best be managed moving forward. Pt wife requesting pt be transitioned to a DM friendly formula due to episodes of hyperglycemia at home; RD agrees that this is reasonable and extra fiber may help with pt's diarrhea. Pt wife also requesting communication when changes to pt care are made. RD and wife agreeable to trialing formula change over the weekend and following up to make changes as necessary. She is also requesting to speak with RD to confirm home TF formula regimen to ensure that she administers correct regimen when she goes home. Pt wife tearful at multiple moments during visit; RD validated feelings/ concerns and provided emotional support and supportive presence throughout visit. Above communicated with RN and MD; wife appreciate of RD time.   Case discussed with RN and MD. Received permission to resume TF. Pt wife also asked about pt consuming ice chips, as she was told by surgeon last admission that thi was permissible and pt complaining of dry mouth. MD gave order for ice chips.   Reviewed wt hx; pt has experienced a 5.3% wt loss over the past 7 months, which is not significant for time frame. Noted pt with further depletions since  last admission; suspect dehydration may be playing a role.   Medications reviewed and include lokelma.   Lab Results  Component Value Date   HGBA1C 7.8 (H) 03/15/2023   PTA DM medications are 15 units insulin lispro TID with meals, 15 units insulin glargine daily, and 25 units insulin glargine daily.   Labs reviewed: Na: 133, K: 5.6, Mg: 2.7, CBGS: 94-98 (inpatient orders for glycemic control are 0-5 units insulin aspart daily at bedtime, 0-9 units insulin aspart TID with meals, and 15 units insulin glargine-yfgn daily at bedtime).    NUTRITION - FOCUSED PHYSICAL EXAM:  Flowsheet Row Most Recent Value  Orbital Region No depletion  Upper Arm Region Mild depletion  Thoracic and Lumbar Region No depletion  Buccal Region Mild depletion  Temple Region Mild depletion  Clavicle Bone Region No depletion  Clavicle and Acromion Bone Region No depletion  Scapular Bone Region No depletion  Dorsal Hand Mild depletion  Patellar Region No depletion  Anterior Thigh Region No depletion  Posterior Calf Region No depletion  Edema (RD Assessment) Mild  Hair Reviewed  Eyes Reviewed  Mouth Reviewed  Skin Reviewed  Nails Reviewed       Diet Order:   Diet Order             Diet NPO time specified  Diet effective midnight  EDUCATION NEEDS:   Education needs have been addressed  Skin:  Skin Assessment: Reviewed RN Assessment  Last BM:  03/29/23 (type 7)  Height:   Ht Readings from Last 1 Encounters:  03/18/23 6\' 4"  (1.93 m)    Weight:   Wt Readings from Last 1 Encounters:  03/21/23 130.3 kg    Ideal Body Weight:  75.2 kg (adjusted for bilateral BKAs)  BMI:  There is no height or weight on file to calculate BMI.  Estimated Nutritional Needs:   Kcal:  1610-9604  Protein:  115-130 grams  Fluid:  > 2 L    Levada Schilling, RD, LDN, CDCES Registered Dietitian III Certified Diabetes Care and Education Specialist Please refer to [facility] for RD and/or RD  on-call/weekend/after hours pager

## 2023-03-29 NOTE — Telephone Encounter (Signed)
Patient Product/process development scientist completed.    The patient is insured through [facility]. Patient has Medicare and is not eligible for a copay card, but may be able to apply for patient assistance, if available.    Ran test claim for Vancomycin 125 mg and the current 10 day co-pay is $1.50.  Ran test claim for Dificid 200 mg and the current 10 day co-pay is $11.20.  This test claim was processed through [facility]- copay amounts may vary at other pharmacies due to pharmacy/plan contracts, or as the patient moves through the different stages of their insurance plan.     Roland Earl, CPHT Pharmacy Technician III Certified Patient Advocate [facility] Patient Advocate Team Direct Number: [phone]  Fax: [phone]

## 2023-03-29 NOTE — Progress Notes (Signed)
Progress Note   Patient: [name]. WUJ:[id number] DOB: Feb 23, 1956 DOA: 03/28/2023     1 DOS: the patient was seen and examined on 03/29/2023   Brief hospital course:  [name]. is a 67 y.o. male with medical history significant of atrial fibrillation, asthma, congestive heart failure, COPD, coronary artery disease, depression, diabetes mellitus, gout, hyperlipidemia, hypertension, hypogonadism in male, morbid obesity, peripheral vascular disease, OSA on CPAP at night, esophageal cancer status post chemo and G-tube placement.  Patient currently being managed for hyperkalemia, severe dehydration and AKI and acute metabolic encephalopathy Assessment and Plan:  Acute kidney injury secondary to dehydration Severe dehydration Acute metabolic encephalopathy secondary to above Continue IV fluid management Monitor electrolytes closely Monitor renal function closely Avoid nephrotoxic medications Renally dose all drugs   Possible right perinephric abscess Continue Zosyn Interventional radiology on board for aspiration tomorrow Keep n.p.o. after midnight Urologist also on board and case discussed According to urologist this is likely chronic   Severe life-threatening hyperkalemia Patient presented with elevated potassium and received hyperkalemia cocktail in the emergency room I have again ordered hyperkalemia cocktail given repeat potassium still elevated Continue Lokelma   Acute diarrhea likely secondary to recent initiation of tube feeding Follow-up on GI panel   Chronic persistent atrial fibrillation Takes Eliquis at home Hold Eliquis given planned IR intervention Continue amiodarone   Chronic diastolic congestive heart failure Recent EF of 55% on echo obtained on November 2024 Monitor input and output Daily weighing Holding other antihypertensives at this time on account of relative hypotension   COPD Continue as needed nebulization    Coronary  artery disease,  Continue aspirin therapy Holding other antihypertensives at this time on account of hypotension   Diabetes mellitus type 2 with hyperglycemia Continue insulin therapy Monitor glucose levels   Hypothyroidism Continue levothyroxine therapy   Essential hypertension Holding home antihypertensives   Morbid obesity We will consult weight loss when medically stable   Peripheral vascular disease Continue aspirin  Patient is allergic to statin therapy   OSA on CPAP at night,  CPAP at night   Esophageal cancer status post chemo and G-tube placement. Dietitian consulted for resumption of tube feeding      Advance Care Planning:   Code Status: Prior full code   Consults: Interventional radiology, urology   Family Communication: Wife    Subjective:  Patient seen and examined in the presence of the wife He tells me his weakness is improving Denies nausea vomiting Still awaiting IR guided drainage of perinephric fluid  Physical Exam:   General: He is not in acute distress morbidly obese, appears lethargic. HENT:     Head: Normocephalic and atraumatic.     Nose: Nose normal.     Mouth/Throat:     Mouth: Mucous membranes are moist.  Eyes:     Pupils: Pupils are equal, round, and reactive to light.  Cardiovascular:     Rate and Rhythm: Normal rate and regular rhythm.  Pulmonary:     Effort: Pulmonary effort is normal.  Abdominal: G-tube in place    General: Bowel sounds are normal.  Musculoskeletal: Bilateral below-knee amputation Skin:    General: Skin is warm.  Neurological: Awake but drowsy easily falls asleep but able to engage in conversation   Vitals:   03/29/23 0951 03/29/23 1207 03/29/23 1409 03/29/23 1753  BP: 123/63 (!) 89/66 91/67 111/74  Pulse: (!) 120 (!) 125 (!) 125 (!) 125  Resp: 17 17  17   Temp:  99.3 F (37.4 C) 98.9 F (37.2 C) 98.4 F (36.9 C) 98.4 F (36.9 C)  TempSrc: Oral  Oral   SpO2: 93% 94% 96% 96%    Data  Reviewed:    Latest Ref Rng & Units 03/29/2023    9:58 AM 03/28/2023   10:35 AM 03/28/2023    2:40 AM  BMP  Glucose 70 - 99 mg/dL 99   161   BUN 8 - 23 mg/dL 48   62   Creatinine 0.96 - 1.24 mg/dL 0.45   4.09   Sodium 811 - 145 mmol/L 133   133   Potassium 3.5 - 5.1 mmol/L 5.6  6.7  C 6.2   Chloride 98 - 111 mmol/L 99   92   CO2 22 - 32 mmol/L 25   30   Calcium 8.9 - 10.3 mg/dL 7.9   8.8     C Corrected result       Latest Ref Rng & Units 03/29/2023    9:58 AM 03/28/2023   12:11 AM 03/15/2023    4:02 AM  CBC  WBC 4.0 - 10.5 K/uL 10.8  10.6  6.7   Hemoglobin 13.0 - 17.0 g/dL 91.4  78.2  95.6   Hematocrit 39.0 - 52.0 % 35.7  37.7  36.1   Platelets 150 - 400 K/uL 237  240  160       Disposition: Status is: Inpatient   Time spent: 57 minutes  Author: Loyce Dys, MD 03/29/2023 5:54 PM  For on call review [email or web address].

## 2023-03-29 NOTE — Plan of Care (Signed)
Pt is alert to self, follows commands who was admitted for C-Diff. Isolation precautions initiated and maintained for C-Diff and hyperkalemia. Pt has redness to buttocks and abrasion to right stump and redness to left stump. Pt has bilateral AKA. Pt has G-tube fro feeding and medication administration due to esophageal cancer. Pt has Foley that was placed in the ED due to urinary retention. Pt has been tachycardic with low blood pressure, M. D. Aware IV fluids running. Pt is on 3L/[place] and has history of COPD. Pt has Port a Cath not accessed that was placed previous visit here at [facility]. Wife at bedside.   Problem: Education: Goal: Ability to describe self-care measures that may prevent or decrease complications (Diabetes Survival Skills Education) will improve 03/29/2023 1651 by Daneen Schick, RN Outcome: Not Progressing 03/29/2023 1651 by Merlene Pulling, Riley Nearing, RN Outcome: Not Progressing Goal: Individualized Educational Video(s) 03/29/2023 1651 by Daneen Schick, RN Outcome: Not Progressing 03/29/2023 1651 by Merlene Pulling, Riley Nearing, RN Outcome: Not Progressing   Problem: Coping: Goal: Ability to adjust to condition or change in health will improve Outcome: Not Progressing   Problem: Fluid Volume: Goal: Ability to maintain a balanced intake and output will improve Outcome: Not Progressing   Problem: Health Behavior/Discharge Planning: Goal: Ability to identify and utilize available resources and services will improve Outcome: Not Progressing Goal: Ability to manage health-related needs will improve Outcome: Not Progressing   Problem: Metabolic: Goal: Ability to maintain appropriate glucose levels will improve Outcome: Not Progressing   Problem: Nutritional: Goal: Maintenance of adequate nutrition will improve Outcome: Not Progressing Goal: Progress toward achieving an optimal weight will improve Outcome: Not Progressing   Problem: Skin Integrity: Goal: Risk for impaired  skin integrity will decrease Outcome: Not Progressing   Problem: Tissue Perfusion: Goal: Adequacy of tissue perfusion will improve Outcome: Not Progressing   Problem: Education: Goal: Knowledge of General Education information will improve Description: Including pain rating scale, medication(s)/side effects and non-pharmacologic comfort measures Outcome: Not Progressing   Problem: Health Behavior/Discharge Planning: Goal: Ability to manage health-related needs will improve Outcome: Not Progressing   Problem: Clinical Measurements: Goal: Ability to maintain clinical measurements within normal limits will improve Outcome: Not Progressing Goal: Will remain free from infection Outcome: Not Progressing Goal: Diagnostic test results will improve Outcome: Not Progressing Goal: Respiratory complications will improve Outcome: Not Progressing Goal: Cardiovascular complication will be avoided Outcome: Not Progressing   Problem: Activity: Goal: Risk for activity intolerance will decrease Outcome: Not Progressing   Problem: Nutrition: Goal: Adequate nutrition will be maintained Outcome: Not Progressing   Problem: Coping: Goal: Level of anxiety will decrease Outcome: Not Progressing   Problem: Elimination: Goal: Will not experience complications related to bowel motility Outcome: Not Progressing Goal: Will not experience complications related to urinary retention Outcome: Not Progressing   Problem: Pain Management: Goal: General experience of comfort will improve Outcome: Not Progressing   Problem: Safety: Goal: Ability to remain free from injury will improve Outcome: Not Progressing   Problem: Skin Integrity: Goal: Risk for impaired skin integrity will decrease Outcome: Not Progressing

## 2023-03-29 NOTE — ED Notes (Signed)
Pt had episode of diarrhea, pt cleaned and placed in new brief.

## 2023-03-30 DIAGNOSIS — E875 Hyperkalemia: Secondary | ICD-10-CM | POA: Diagnosis not present

## 2023-03-30 LAB — BASIC METABOLIC PANEL
Anion gap: 10 (ref 5–15)
BUN: 44 mg/dL — ABNORMAL HIGH (ref 8–23)
CO2: 26 mmol/L (ref 22–32)
Calcium: 7.9 mg/dL — ABNORMAL LOW (ref 8.9–10.3)
Chloride: 98 mmol/L (ref 98–111)
Creatinine, Ser: 1.5 mg/dL — ABNORMAL HIGH (ref 0.61–1.24)
GFR, Estimated: 50 mL/min — ABNORMAL LOW (ref >60)
Glucose, Bld: 123 mg/dL — ABNORMAL HIGH (ref 70–99)
Potassium: 4.7 mmol/L (ref 3.5–5.1)
Sodium: 134 mmol/L — ABNORMAL LOW (ref 135–145)

## 2023-03-30 LAB — GLUCOSE, CAPILLARY
Glucose-Capillary: 109 mg/dL — ABNORMAL HIGH (ref 70–99)
Glucose-Capillary: 94 mg/dL (ref 70–99)
Glucose-Capillary: 97 mg/dL (ref 70–99)
Glucose-Capillary: 112 mg/dL — ABNORMAL HIGH (ref 70–99)
Glucose-Capillary: 143 mg/dL — ABNORMAL HIGH (ref 70–99)

## 2023-03-30 LAB — CBC WITH DIFFERENTIAL/PLATELET
Abs Immature Granulocytes: 0.03 10*3/uL (ref 0.00–0.07)
Basophils Absolute: 0.1 10*3/uL (ref 0.0–0.1)
Basophils Relative: 1 %
Eosinophils Absolute: 0.5 10*3/uL (ref 0.0–0.5)
Eosinophils Relative: 6 %
HCT: 35.6 % — ABNORMAL LOW (ref 39.0–52.0)
Hemoglobin: 11.2 g/dL — ABNORMAL LOW (ref 13.0–17.0)
Immature Granulocytes: 0 %
Lymphocytes Relative: 10 %
Lymphs Abs: 0.9 10*3/uL (ref 0.7–4.0)
MCH: 29.2 pg (ref 26.0–34.0)
MCHC: 31.5 g/dL (ref 30.0–36.0)
MCV: 93 fL (ref 80.0–100.0)
Monocytes Absolute: 0.6 10*3/uL (ref 0.1–1.0)
Monocytes Relative: 7 %
Neutro Abs: 6.7 10*3/uL (ref 1.7–7.7)
Neutrophils Relative %: 76 %
Platelets: 228 10*3/uL (ref 150–400)
RBC: 3.83 MIL/uL — ABNORMAL LOW (ref 4.22–5.81)
RDW: 14.4 % (ref 11.5–15.5)
WBC: 8.7 10*3/uL (ref 4.0–10.5)
nRBC: 0 % (ref 0.0–0.2)

## 2023-03-30 NOTE — Plan of Care (Signed)
  Problem: Coping: Goal: Ability to adjust to condition or change in health will improve Outcome: Progressing   Problem: Fluid Volume: Goal: Ability to maintain a balanced intake and output will improve Outcome: Progressing   Problem: Health Behavior/Discharge Planning: Goal: Ability to identify and utilize available resources and services will improve Outcome: Progressing   Problem: Nutritional: Goal: Maintenance of adequate nutrition will improve Outcome: Progressing   Problem: Skin Integrity: Goal: Risk for impaired skin integrity will decrease Outcome: Progressing   Problem: Tissue Perfusion: Goal: Adequacy of tissue perfusion will improve Outcome: Progressing   Problem: Education: Goal: Knowledge of General Education information will improve Description: Including pain rating scale, medication(s)/side effects and non-pharmacologic comfort measures Outcome: Progressing   Problem: Activity: Goal: Risk for activity intolerance will decrease Outcome: Progressing   Problem: Nutrition: Goal: Adequate nutrition will be maintained Outcome: Progressing   Problem: Coping: Goal: Level of anxiety will decrease Outcome: Progressing   Problem: Elimination: Goal: Will not experience complications related to bowel motility Outcome: Progressing   Problem: Pain Management: Goal: General experience of comfort will improve Outcome: Progressing   Problem: Skin Integrity: Goal: Risk for impaired skin integrity will decrease Outcome: Progressing

## 2023-03-30 NOTE — Progress Notes (Signed)
Progress Note   Patient: [name]. ZOX:[id number] DOB: 1955/07/14 DOA: 03/28/2023     2 DOS: the patient was seen and examined on 03/30/2023   Brief hospital course:   [name]. is a 67 y.o. male with medical history significant of atrial fibrillation, asthma, congestive heart failure, COPD, coronary artery disease, depression, diabetes mellitus, gout, hyperlipidemia, hypertension, hypogonadism in male, morbid obesity, peripheral vascular disease, OSA on CPAP at night, esophageal cancer status post chemo and G-tube placement.  Patient currently being managed for hyperkalemia, severe dehydration and AKI and acute metabolic encephalopathy.   Assessment and Plan:   Acute kidney injury secondary to dehydration Severe dehydration Acute metabolic encephalopathy secondary to above Continue IV fluid management Monitor electrolytes closely Avoid nephrotoxic medications Monitor renal function   Acute diarrhea secondary to C. difficile infection Continue on Dificid Still has some evidence of watery stool today   Possible right perinephric abscess Interventional radiology on board and planning outpatient aspiration as according to them this is less likely an abscess According to urology Dr. Vanna Scotland she believes that this is chronic from previous trauma/hematoma formation Urologist also on board and case discussed Patient was started empirically on Zosyn and this can be transitioned to Augmentin at discharge.   Severe life-threatening hyperkalemia-improved Patient presented with elevated potassium and received hyperkalemia cocktail in the emergency room I have again ordered hyperkalemia cocktail given repeat potassium still elevated Patient received Lokelma   Acute diarrhea likely secondary to recent initiation of tube feeding Follow-up on GI panel   Chronic persistent atrial fibrillation Takes Eliquis at home Hold Eliquis given planned IR  intervention Continue amiodarone   Chronic diastolic congestive heart failure Recent EF of 55% on echo obtained on November 2024 Monitor input and output Daily weighing Holding other antihypertensives at this time on account of relative hypotension   COPD Continue as needed nebulization    Coronary artery disease,  Continue aspirin therapy Holding other antihypertensives at this time on account of hypotension   Diabetes mellitus type 2 with hyperglycemia Continue insulin therapy Monitor glucose levels   Hypothyroidism Continue levothyroxine therapy   Essential hypertension Holding home antihypertensives   Morbid obesity We will consult weight loss when medically stable   Peripheral vascular disease Continue aspirin  Patient is allergic to statin therapy   OSA on CPAP at night,  CPAP at night   Esophageal cancer status post chemo and G-tube placement. Dietitian consulted for resumption of tube feeding      Advance Care Planning:   Code Status: Prior full code   Consults: Interventional radiology, urology   Family Communication: Wife at bedside     Subjective:  Patient seen and examined at bedside this morning Potassium levels improved Denies chest pain nausea vomiting or worsening abdominal pain   Physical Exam:   General: He is not in acute distress morbidly obese, appears lethargic. HENT:     Head: Normocephalic and atraumatic.     Nose: Nose normal.     Mouth/Throat:     Mouth: Mucous membranes are moist.  Eyes:     Pupils: Pupils are equal, round, and reactive to light.  Cardiovascular:     Rate and Rhythm: Normal rate and regular rhythm.  Pulmonary:     Effort: Pulmonary effort is normal.  Abdominal: G-tube in place    General: Bowel sounds are normal.  Musculoskeletal: Bilateral below-knee amputation Skin:    General: Skin is warm.  Neurological: Awake but drowsy easily falls  asleep but able to engage in conversation    Data Reviewed:       Latest Ref Rng & Units 03/30/2023    3:44 AM 03/29/2023    9:58 AM 03/28/2023   10:35 AM  BMP  Glucose 70 - 99 mg/dL 161  99    BUN 8 - 23 mg/dL 44  48    Creatinine 0.96 - 1.24 mg/dL 0.45  4.09    Sodium 811 - 145 mmol/L 134  133    Potassium 3.5 - 5.1 mmol/L 4.7  5.6  6.7  C  Chloride 98 - 111 mmol/L 98  99    CO2 22 - 32 mmol/L 26  25    Calcium 8.9 - 10.3 mg/dL 7.9  7.9      C Corrected result       Latest Ref Rng & Units 03/30/2023    3:44 AM 03/29/2023    9:58 AM 03/28/2023   12:11 AM  CBC  WBC 4.0 - 10.5 K/uL 8.7  10.8  10.6   Hemoglobin 13.0 - 17.0 g/dL 91.4  78.2  95.6   Hematocrit 39.0 - 52.0 % 35.6  35.7  37.7   Platelets 150 - 400 K/uL 228  237  240     Disposition: Status is: Inpatient     Time spent: 47 minutes    Vitals:   03/30/23 0215 03/30/23 0600 03/30/23 0606 03/30/23 1006  BP: 110/78  102/81 108/77  Pulse: (!) 127  (!) 129 (!) 128  Resp:   20 18  Temp: 98.7 F (37.1 C)  97.7 F (36.5 C) 98.8 F (37.1 C)  TempSrc: Oral   Oral  SpO2: 96%  95% 96%  Weight:  130 kg       Author: Loyce Dys, MD 03/30/2023 4:24 PM  For on call review [email or web address].

## 2023-03-30 NOTE — Evaluation (Addendum)
Physical Therapy Evaluation Patient Details Name: [name]. MRN: [id number] DOB: Nov 25, 1955 Today's Date: 03/30/2023  History of Present Illness  Pt admitted for hyperkalemia and perinephric abscess. PMH includes Afib, asthma, CHF, COPD, CAD, depression, PVD, and esophageal cancer. Currently with PEG tube placed. Of note B LE BKA.  Clinical Impression  Pt is a pleasant 67 year old male who was admitted for hyperkalemia and perinephric abscess. HR high, however cleared for transfer to recliner via RN. Pt performs rolling with min assist (ideally would benefit from bari bed), sup->sit with mod assist and total assist for bed->chair transfers. RN communicated about transfers and using lift to return to bed. Pt demonstrates deficits with strength/mobility/cognition. Pt reports he has been unable to don R prosthetic due to new wound. Would benefit from skilled PT to address above deficits and promote optimal return to PLOF. Pt will continue to receive skilled PT services while admitted and will defer to TOC/care team for updates regarding disposition planning.         If plan is discharge home, recommend the following: A lot of help with walking and/or transfers;A lot of help with bathing/dressing/bathroom;Assistance with cooking/housework;Assist for transportation;Help with stairs or ramp for entrance   Can travel by private vehicle        Equipment Recommendations Hospital bed  Recommendations for Other Services       Functional Status Assessment Patient has had a recent decline in their functional status and/or demonstrates limited ability to make significant improvements in function in a reasonable and predictable amount of time     Precautions / Restrictions Precautions Precautions: Fall Precaution Comments: prosthetic not at hospital at this time Restrictions Weight Bearing Restrictions: No RLE Weight Bearing: Non weight bearing LLE Weight Bearing: Non weight bearing       Mobility  Bed Mobility Overal bed mobility: Needs Assistance Bed Mobility: Rolling, Supine to Sit Rolling: Min assist   Supine to sit: Mod assist     General bed mobility comments: bed too small given size to facilitate indep rolling. Pt needs min assist and use of railings to factiliate B rolling for hygiene. Bed functions used for for assisting transition to sitting at EOB. Mod assist required and then pt able to sit with supervision at EOB    Transfers Overall transfer level: Needs assistance   Transfers: Bed to chair/wheelchair/BSC Sit to Stand: +2 physical assistance, Via lift equipment          Lateral/Scoot Transfers: Max assist General transfer comment: attempted for extended time to perform lateral transfer, however pt unable to weight shift and perform adequate head/hips ratio to scoot. Hoyer lift sling placed while in seated position and utilized lift for bed->chair transfer.    Ambulation/Gait               General Gait Details: non ambulatory at baseline  Stairs            Wheelchair Mobility     Tilt Bed    Modified Rankin (Stroke Patients Only)       Balance Overall balance assessment: Needs assistance Sitting-balance support: Bilateral upper extremity supported, Feet unsupported Sitting balance-Leahy Scale: Fair                                       Pertinent Vitals/Pain Pain Assessment Pain Assessment: No/denies pain    Home Living Family/patient expects to be discharged  to:: Private residence Living Arrangements: Spouse/significant other Available Help at Discharge: Family;Available 24 hours/day Type of Home: House Home Access: Ramped entrance       Home Layout: One level Home Equipment: Wheelchair - manual;BSC/3in1 Additional Comments: Bariatric RW; knee scooter, hoyer, slide board    Prior Function Prior Level of Function : Needs assist             Mobility Comments: Pt is able to complete  bed mobility & bed>w/c transfers with slide board without assistance, lateral scoot w/c<>toilet without board, propel w/c, family/friends use hoyer lift to assist pt back to bed each night. Pt was attending OPPT, recently received BLE prosthesis & starting to work on standing. ADLs Comments: Wife assists in ADL's/IADL's. Reports donning socks with sock aid and working to be as independent as possible.     Extremity/Trunk Assessment   Upper Extremity Assessment Upper Extremity Assessment: Generalized weakness (B UE grossly 4/5)    Lower Extremity Assessment Lower Extremity Assessment: Generalized weakness (B LE grossly 3/5- bandage noted on R LE)       Communication   Communication Communication: No apparent difficulties  Cognition Arousal: Alert Behavior During Therapy: WFL for tasks assessed/performed Overall Cognitive Status: Impaired/Different from baseline                                 General Comments: alert and oriented x 2 and is confused to situation and repeats questions including asking for water despite education on NPO status. Decreased safety awareness noted        General Comments      Exercises Other Exercises Other Exercises: rolling to B sides for liquid BM. Total assist for hygiene   Assessment/Plan    PT Assessment Patient needs continued PT services  PT Problem List Decreased strength;Pain;Decreased activity tolerance;Decreased balance;Decreased mobility;Decreased knowledge of use of DME       PT Treatment Interventions Balance training;DME instruction;Modalities;Neuromuscular re-education;Functional mobility training;Therapeutic activities;Therapeutic exercise;Manual techniques;Wheelchair mobility training;Patient/family education    PT Goals (Current goals can be found in the Care Plan section)  Acute Rehab PT Goals Patient Stated Goal: get better PT Goal Formulation: With patient Time For Goal Achievement: 04/13/23 Potential to  Achieve Goals: Good    Frequency Min 1X/week     Co-evaluation               AM-PAC PT "6 Clicks" Mobility  Outcome Measure Help needed turning from your back to your side while in a flat bed without using bedrails?: A Little Help needed moving from lying on your back to sitting on the side of a flat bed without using bedrails?: A Lot Help needed moving to and from a bed to a chair (including a wheelchair)?: Total Help needed standing up from a chair using your arms (e.g., wheelchair or bedside chair)?: Total Help needed to walk in hospital room?: Total Help needed climbing 3-5 steps with a railing? : Total 6 Click Score: 9    End of Session   Activity Tolerance: Patient tolerated treatment well Patient left: in chair;with chair alarm set Nurse Communication: Mobility status PT Visit Diagnosis: Muscle weakness (generalized) (M62.81);Other abnormalities of gait and mobility (R26.89)    Time: 7782-4235 PT Time Calculation (min) (ACUTE ONLY): 47 min   Charges:   PT Evaluation $PT Eval Moderate Complexity: 1 Mod PT Treatments $Therapeutic Activity: 23-37 mins PT General Charges $$ ACUTE PT VISIT: 1 Visit  Elizabeth Palau, PT, DPT, GCS [phone]   [name] 03/30/2023, 2:40 PM

## 2023-03-30 NOTE — Progress Notes (Signed)
Patient supine in bed in morning, refused to roll side to side for posterior back skin inspection.  Patient transferred to chair with physical therapy in afternoon per patient request.  Patient transferred back to bed with hoyer lift and assist x3.  Patient positioned on right side following peri-care with barrier cream to maintain skin integrity.

## 2023-03-30 NOTE — Progress Notes (Signed)
h/o chronic persistent afib hr fluctuating to the 130,s around 3 am., he been at 125-126 yesterday of which the dr was aware according to nurse. presently he is steady at 127-128. has no complaints, all other v/s are with in normal ranges. Nigh tprovider informed, states continue to monitor.

## 2023-03-31 DIAGNOSIS — E875 Hyperkalemia: Secondary | ICD-10-CM | POA: Diagnosis not present

## 2023-03-31 LAB — CBC WITH DIFFERENTIAL/PLATELET
Abs Immature Granulocytes: 0.04 10*3/uL (ref 0.00–0.07)
Basophils Absolute: 0.1 10*3/uL (ref 0.0–0.1)
Basophils Relative: 1 %
Eosinophils Absolute: 0.4 10*3/uL (ref 0.0–0.5)
Eosinophils Relative: 4 %
HCT: 34.9 % — ABNORMAL LOW (ref 39.0–52.0)
Hemoglobin: 11.3 g/dL — ABNORMAL LOW (ref 13.0–17.0)
Immature Granulocytes: 0 %
Lymphocytes Relative: 8 %
Lymphs Abs: 0.7 10*3/uL (ref 0.7–4.0)
MCH: 29.2 pg (ref 26.0–34.0)
MCHC: 32.4 g/dL (ref 30.0–36.0)
MCV: 90.2 fL (ref 80.0–100.0)
Monocytes Absolute: 0.7 10*3/uL (ref 0.1–1.0)
Monocytes Relative: 7 %
Neutro Abs: 7.5 10*3/uL (ref 1.7–7.7)
Neutrophils Relative %: 80 %
Platelets: 256 10*3/uL (ref 150–400)
RBC: 3.87 MIL/uL — ABNORMAL LOW (ref 4.22–5.81)
RDW: 14.2 % (ref 11.5–15.5)
WBC: 9.4 10*3/uL (ref 4.0–10.5)
nRBC: 0 % (ref 0.0–0.2)

## 2023-03-31 LAB — BASIC METABOLIC PANEL
Anion gap: 9 (ref 5–15)
BUN: 38 mg/dL — ABNORMAL HIGH (ref 8–23)
CO2: 26 mmol/L (ref 22–32)
Calcium: 8.2 mg/dL — ABNORMAL LOW (ref 8.9–10.3)
Chloride: 102 mmol/L (ref 98–111)
Creatinine, Ser: 1.53 mg/dL — ABNORMAL HIGH (ref 0.61–1.24)
GFR, Estimated: 49 mL/min — ABNORMAL LOW (ref >60)
Glucose, Bld: 145 mg/dL — ABNORMAL HIGH (ref 70–99)
Potassium: 4.5 mmol/L (ref 3.5–5.1)
Sodium: 137 mmol/L (ref 135–145)

## 2023-03-31 LAB — GLUCOSE, CAPILLARY
Glucose-Capillary: 128 mg/dL — ABNORMAL HIGH (ref 70–99)
Glucose-Capillary: 154 mg/dL — ABNORMAL HIGH (ref 70–99)
Glucose-Capillary: 162 mg/dL — ABNORMAL HIGH (ref 70–99)
Glucose-Capillary: 178 mg/dL — ABNORMAL HIGH (ref 70–99)
Glucose-Capillary: 197 mg/dL — ABNORMAL HIGH (ref 70–99)

## 2023-03-31 MED ORDER — VITAMIN D 25 MCG (1000 UNIT) PO TABS
2000.0000 [IU] | ORAL_TABLET | Freq: Every day | ORAL | Status: DC
  Administered 2023-03-31 – 2023-04-14 (×15): 2000 [IU]
  Filled 2023-03-31 (×15): qty 2

## 2023-03-31 MED ORDER — APIXABAN 5 MG PO TABS
5.0000 mg | ORAL_TABLET | Freq: Two times a day (BID) | ORAL | Status: DC
  Administered 2023-03-31 – 2023-04-14 (×29): 5 mg
  Filled 2023-03-31 (×29): qty 1

## 2023-03-31 MED ORDER — ADULT MULTIVITAMIN W/MINERALS CH
1.0000 | ORAL_TABLET | Freq: Every day | ORAL | Status: DC
  Administered 2023-03-31 – 2023-04-18 (×19): 1 via ORAL
  Filled 2023-03-31 (×19): qty 1

## 2023-03-31 MED ORDER — VITAMIN C 500 MG PO TABS
500.0000 mg | ORAL_TABLET | Freq: Every day | ORAL | Status: DC
Start: 2023-03-31 — End: 2023-04-14
  Administered 2023-03-31 – 2023-04-14 (×14): 500 mg
  Filled 2023-03-31 (×14): qty 1

## 2023-03-31 MED ORDER — CYANOCOBALAMIN 500 MCG PO TABS
10000.0000 ug | ORAL_TABLET | Freq: Every day | ORAL | Status: DC
  Administered 2023-04-01 – 2023-04-02 (×2): 10000 ug
  Filled 2023-03-31: qty 20

## 2023-03-31 MED ORDER — METOPROLOL TARTRATE 5 MG/5ML IV SOLN
5.0000 mg | Freq: Once | INTRAVENOUS | Status: AC
  Administered 2023-03-31: 5 mg via INTRAVENOUS
  Filled 2023-03-31: qty 5

## 2023-03-31 MED ORDER — AMOXICILLIN-POT CLAVULANATE 875-125 MG PO TABS
1.0000 | ORAL_TABLET | Freq: Two times a day (BID) | ORAL | Status: DC
  Administered 2023-03-31 – 2023-04-02 (×5): 1
  Filled 2023-03-31 (×5): qty 1

## 2023-03-31 MED ORDER — FERROUS SULFATE 220 (44 FE) MG/5ML PO SOLN
300.0000 mg | Freq: Every day | ORAL | Status: DC
  Administered 2023-03-31 – 2023-04-14 (×15): 300 mg
  Filled 2023-03-31 (×15): qty 6.82

## 2023-03-31 MED ORDER — JUVEN PO PACK
1.0000 | PACK | Freq: Two times a day (BID) | ORAL | Status: DC
  Administered 2023-03-31 – 2023-04-03 (×8): 1

## 2023-03-31 MED ORDER — PRIMIDONE 250 MG PO TABS
250.0000 mg | ORAL_TABLET | Freq: Two times a day (BID) | ORAL | Status: DC
  Administered 2023-03-31 – 2023-04-14 (×29): 250 mg
  Filled 2023-03-31 (×30): qty 1

## 2023-03-31 MED ORDER — FERROUS SULFATE 300 (60 FE) MG/5ML PO SOLN
300.0000 mg | Freq: Every day | ORAL | Status: DC
  Filled 2023-03-31: qty 5

## 2023-03-31 MED ORDER — EZETIMIBE 10 MG PO TABS
10.0000 mg | ORAL_TABLET | Freq: Every day | ORAL | Status: DC
  Administered 2023-03-31 – 2023-04-14 (×15): 10 mg
  Filled 2023-03-31 (×15): qty 1

## 2023-03-31 MED ORDER — DILTIAZEM HCL 60 MG PO TABS
75.0000 mg | ORAL_TABLET | Freq: Four times a day (QID) | ORAL | Status: DC
  Administered 2023-03-31 – 2023-04-03 (×14): 75 mg
  Filled 2023-03-31 (×16): qty 0.5

## 2023-03-31 NOTE — Progress Notes (Signed)
[facility]  - [place] [facility]   PATIENT NAME: [name]    MR#:  [id number]  DATE OF BIRTH:  12/29/1955  SUBJECTIVE:  [name] at bedside. Patient is much improved per wife his mentation is back to baseline. Hemodynamically otherwise stable. [name] tachycardic. Will resume PO Cardizem today. Patient and wife aware that no IR procedure is going to be done. Will resume eliquis as well. Tube feeding going on well. Sugars stable. No fever.  Patient has rectal tube with some greenish diarrheal stools.    VITALS:  Blood pressure 136/89, pulse (!) 132, temperature 97.9 F (36.6 C), resp. rate 18, weight 130.9 kg, SpO2 97%.  PHYSICAL EXAMINATION:   GENERAL:  67 y.o.-year-old patient with no acute distress. obese, chronically ill LUNGS:decreased breath sounds bilaterally, no wheezing CARDIOVASCULAR: S1, S2 normal. tachycardia ABDOMEN: Soft, nontender, nondistended. PEG+, rectal tube + EXTREMITIES: Bilateral amputation stump  NEUROLOGIC: nonfocal  patient is alert and awake  LABORATORY PANEL:  CBC Recent Labs  Lab 03/31/23 0320  WBC 9.4  HGB 11.3*  HCT 34.9*  PLT 256    Chemistries  Recent Labs  Lab 03/28/23 0011 03/28/23 0240 03/28/23 1035 03/31/23 0320  NA 133* 133*   < > 137  K 6.5* 6.2*   < > 4.5  CL 94* 92*   < > 102  CO2 30 30   < > 26  GLUCOSE 252* 171*   < > 145*  BUN 61* 62*   < > 38*  CREATININE 1.71* 1.74*   < > 1.53*  CALCIUM 8.6* 8.8*   < > 8.2*  MG  --  2.7*  --   --   AST 14*  --   --   --   ALT 18  --   --   --   ALKPHOS 120  --   --   --   BILITOT 0.4  --   --   --    < > = values in this interval not displayed.   Assessment and Plan  [name]. is a 67 y.o. male with medical history significant of atrial fibrillation, asthma, congestive heart failure, COPD, coronary artery disease, depression, diabetes mellitus, gout, hyperlipidemia, hypertension, hypogonadism in male, morbid obesity, peripheral  vascular disease, OSA on CPAP at night, esophageal cancer status post chemo and G-tube placement.  Patient currently being managed for hyperkalemia, severe dehydration and AKI and acute metabolic encephalopathy.   Acute kidney injury secondary to dehydration Severe dehydration Acute metabolic encephalopathy secondary to above --Avoid nephrotoxic medications --improved with IVF --cont free water thru PEG --mentation back to baseline   Acute diarrhea secondary to C. difficile infection --Continue on Dificid x 10 days --Still has some evidence of watery stool--rectal tube+   Possible right perinephric abscess --Interventional radiology on board and planning outpatient aspiration as according to them this is less likely an abscess --According to urology Dr. Vanna Wood she believes that this is chronic from previous trauma/hematoma formation --Patient was started empirically on Zosyn and this can be transitioned to Augmentin since no clinical s/o sepsis   Severe life-threatening hyperkalemia-improved --resolved --K 4.5    Chronic persistent atrial fibrillation Tachycardia --Takes Eliquis at home --resumed Eliquis now since IR procedure not needed (per discussion by Dr Meriam Sprague with IR and Urology) --Continue amiodarone --resume Diltiazem    Chronic diastolic congestive heart failure --Recent EF of 55% on echo obtained on November 2024  COPD --Continue as needed nebulization  Coronary artery disease,  --Continue aspirin therapy   Diabetes mellitus type 2 with hyperglycemia --Continue insulin therapy  Hypothyroidism --on levothyroxine     Nutrition Status: Nutrition Problem: Moderate Malnutrition Etiology: chronic illness (esopahgeal mass, likely malginant) Signs/Symptoms: mild fat depletion, mild muscle depletion Interventions: Tube feeding  Peripheral vascular disease --Continue aspirin  --Patient is allergic to statin therapy   OSA on CPAP at night,  --CPAP at  night   Esophageal cancer status post chemo and G-tube placement. --Dietitian consulted for resumption of tube feeding      Advance Care Planning:   Code Status: Prior full code  Consults: Interventional radiology, urology  Family Communication: [name] at bedside DVT Prophylaxis :Eliquis Level of care: Telemetry Medical Status is: Inpatient Remains inpatient appropriate because: C diff diarrhea    TOTAL TIME TAKING CARE OF THIS PATIENT: 40 minutes.  >50% time spent on counselling and coordination of care  Note: This dictation was prepared with Dragon dictation along with smaller phrase technology. Any transcriptional errors that result from this process are unintentional.  [name] M.D    [facility]   CC: Primary care physician; [name], MD

## 2023-03-31 NOTE — Progress Notes (Addendum)
Physical Therapy Treatment Patient Details Name: [name]. MRN: [id number] DOB: 11-Aug-1955 Today's Date: 03/31/2023   History of Present Illness Pt admitted for hyperkalemia and perinephric abscess. PMH includes Afib, asthma, CHF, COPD, CAD, depression, PVD, and esophageal cancer. Currently with PEG tube placed. Of note B LE BKA.    PT Comments  Pt in bed.  Initially seems to be mentally clear but during session and discussions confusion remains evident.  He does not know he is at [facility], asks where his regular room is and then he comments sprinkler head in ceiling stating it is a "star angel"  Stated they are rare meteorites that happen this time of year.  When reviewed he was inside of a hospital he stated that they occur inside too.  He is able to transition to long sitting in bed with BUE rails to pull up on and min a x 1.  He remains sitting unsupported x 15 minutes with light CGA and progresses to supervision with time.  BUE activity and ex in sitting without LOB.  OT in for eval during session.  Fatigued with tasks and returns to supine where he is able to pull up on headboard to reposition.  Did not progress mobility to EOB due to rectal tube and fear of pulling it due to friction with transition.   If plan is discharge home, recommend the following: A lot of help with walking and/or transfers;A lot of help with bathing/dressing/bathroom;Assistance with cooking/housework;Assist for transportation;Help with stairs or ramp for entrance   Can travel by private vehicle        Equipment Recommendations  Hospital bed    Recommendations for Other Services       Precautions / Restrictions Precautions Precautions: Fall Precaution Comments: prosthetic not at hospital at this time Restrictions Weight Bearing Restrictions: No RLE Weight Bearing: Non weight bearing LLE Weight Bearing: Non weight bearing     Mobility  Bed Mobility Overal bed mobility: Needs Assistance Bed  Mobility: Supine to Sit       Sit to supine: Min assist, Mod assist   General bed mobility comments: uses rails to pull up in sitting position in bed.  did not get to EOB due to rectal tube and fear of pulling it out    Transfers                   General transfer comment: will need Michiel Sites to chair.    Ambulation/Gait               General Gait Details: non ambulatory at baseline   Stairs             Wheelchair Mobility     Tilt Bed    Modified Rankin (Stroke Patients Only)       Balance Overall balance assessment: Needs assistance Sitting-balance support: Bilateral upper extremity supported, Feet unsupported Sitting balance-Leahy Scale: Fair Sitting balance - Comments: able to sit with supervision to CGA x 15 minutes in bed with UE tasks for OT eval                                    Cognition Arousal: Alert Behavior During Therapy: [facility] for tasks assessed/performed Overall Cognitive Status: Impaired/Different from baseline  General Comments: confusion remains        Exercises      General Comments        Pertinent Vitals/Pain Pain Assessment Pain Assessment: No/denies pain Pain Intervention(s): Monitored during session    Home Living                          Prior Function            PT Goals (current goals can now be found in the care plan section) Progress towards PT goals: Progressing toward goals    Frequency    Min 1X/week      PT Plan      Co-evaluation              AM-PAC PT "6 Clicks" Mobility   Outcome Measure  Help needed turning from your back to your side while in a flat bed without using bedrails?: A Little Help needed moving from lying on your back to sitting on the side of a flat bed without using bedrails?: A Lot Help needed moving to and from a bed to a chair (including a wheelchair)?: Total Help needed standing up  from a chair using your arms (e.g., wheelchair or bedside chair)?: Total Help needed to walk in hospital room?: Total Help needed climbing 3-5 steps with a railing? : Total 6 Click Score: 9    End of Session   Activity Tolerance: Patient tolerated treatment well Patient left: in bed;with bed alarm set;with call bell/phone within reach Nurse Communication: Mobility status PT Visit Diagnosis: Muscle weakness (generalized) (M62.81);Other abnormalities of gait and mobility (R26.89)     Time: 1610-9604 PT Time Calculation (min) (ACUTE ONLY): 20 min  Charges:    $Therapeutic Activity: 8-22 mins PT General Charges $$ ACUTE PT VISIT: 1 Visit                   Danielle Dess, PTA 03/31/23, 2:28 PM

## 2023-03-31 NOTE — Progress Notes (Signed)
Patient was having sustained hr of 130-133. Provider informed. Med ordered see MAR.Marland KitchenProvider came to the floor to evaluate pt. patient is without any complaint.Pt is on telemetry.

## 2023-03-31 NOTE — Evaluation (Signed)
Occupational Therapy Evaluation Patient Details Name: [name]. MRN: [id number] DOB: 03/25/1956 Today's Date: 03/31/2023   History of Present Illness 67yo male pt admitted for hyperkalemia and perinephric abscess. PMH includes Afib, asthma, CHF, COPD, CAD, depression, PVD, and esophageal cancer. Currently with PEG tube placed. Of note B LE BKA.   Clinical Impression   Pt was seen for OT evaluation this date. Pt agreeable to session, noted to have intermittent confusion throughout session (see below for additional detail). Pt required MIN-MOD A for bed mobility to complete sup>sit long sitting in bed with heavy use of bilat bed rails to assist. EOB deferred 2/2 risk of shearing and rectal tube with pt noting discomfort. Pt required set up and supv-CGA for unsupported sitting grooming tasks and MAX A to wash his back. Tolerated sitting ~12min with HR in high 120's and SpO2 90% on room air. Pt presents to acute OT demonstrating impaired ADL performance and functional mobility 2/2 decreased strength, activity tolerance, cognition, and balance (See OT problem list for additional functional deficits). Pt currently requires MAX A for LB ADL, +2 assist for any scoot transfer attempts, setup and supv for seated grooming.  Pt would benefit from skilled OT services to address noted impairments and functional limitations (see below for any additional details) in order to maximize safety and independence while minimizing falls risk and caregiver burden.     If plan is discharge home, recommend the following: A little help with bathing/dressing/bathroom;Assistance with cooking/housework;Assist for transportation;Help with stairs or ramp for entrance;A lot of help with walking and/or transfers;Assistance with feeding;Direct supervision/assist for medications management;Supervision due to cognitive status    Functional Status Assessment  Patient has had a recent decline in their functional status and  demonstrates the ability to make significant improvements in function in a reasonable and predictable amount of time.  Equipment Recommendations  Hospital bed    Recommendations for Other Services       Precautions / Restrictions Precautions Precautions: Fall Precaution Comments: prosthetic not at hospital at this time Restrictions Weight Bearing Restrictions: Yes RLE Weight Bearing: Non weight bearing LLE Weight Bearing: Non weight bearing      Mobility Bed Mobility Overal bed mobility: Needs Assistance Bed Mobility: Supine to Sit, Sit to Supine     Supine to sit: Mod assist Sit to supine: Min assist, Mod assist   General bed mobility comments: uses rails to pull up in sitting position in bed.  did not get to EOB due to rectal tube and fear of pulling it out    Transfers                   General transfer comment: hoyer lift appropriate      Balance Overall balance assessment: Needs assistance Sitting-balance support: Bilateral upper extremity supported, Feet unsupported, No upper extremity supported, Single extremity supported Sitting balance-Leahy Scale: Fair Sitting balance - Comments: able to sit with supv-CGA and intermittent UE support on bed during grooming tasks                                   ADL either performed or assessed with clinical judgement   ADL Overall ADL's : Needs assistance/impaired     Grooming: Set up;Supervision/safety;Contact guard assist;Oral care;Wash/dry face;Sitting Grooming Details (indicate cue type and reason): long sitting in bed without back support, pt able to intermittently use BUE to prepare for grooming tasks but required  intermitent UE support on EOB 2/2 decr strength and activity tolerance                                     Vision         Perception         Praxis         Pertinent Vitals/Pain Pain Assessment Pain Assessment: No/denies pain     Extremity/Trunk  Assessment Upper Extremity Assessment Upper Extremity Assessment: Generalized weakness   Lower Extremity Assessment Lower Extremity Assessment: Generalized weakness (hx bilat BKA, R bka stump wiht wound)       Communication Communication Communication: No apparent difficulties   Cognition Arousal: Alert Behavior During Therapy: WFL for tasks assessed/performed Overall Cognitive Status: Impaired/Different from baseline                                 General Comments: follows commands, intermittently confused, reports seeing a "star angel" on the ceiling (a water sprinkler) and with attempts to reorient to hospital room, pt notes that these rare meteorites can occur inside too.     General Comments  Seated in bed, HR in high 120's and SpO2 90% on room air.    Exercises     Shoulder Instructions      Home Living Family/patient expects to be discharged to:: Private residence Living Arrangements: Spouse/significant other Available Help at Discharge: Family;Available 24 hours/day Type of Home: House Home Access: Ramped entrance     Home Layout: One level     Bathroom Shower/Tub: Tub/shower unit;Sponge bathes at baseline   Bathroom Toilet: Handicapped height     Home Equipment: Wheelchair - manual;BSC/3in1   Additional Comments: Bariatric RW; knee scooter, hoyer, slide board      Prior Functioning/Environment Prior Level of Function : Needs assist             Mobility Comments: Pt is able to complete bed mobility & bed>w/c transfers with slide board without assistance, lateral scoot w/c<>toilet without board, propel w/c, family/friends use hoyer lift to assist pt back to bed each night. Pt was attending OPPT, recently received BLE prosthesis & starting to work on standing. ADLs Comments: Wife assists in ADL's/IADL's. Reports donning socks with sock aid and working to be as independent as possible.        OT Problem List: Decreased  strength;Decreased activity tolerance;Cardiopulmonary status limiting activity;Decreased cognition;Decreased knowledge of use of DME or AE;Impaired balance (sitting and/or standing)      OT Treatment/Interventions: Therapeutic exercise;Therapeutic activities;Patient/family education;Self-care/ADL training;Energy conservation;DME and/or AE instruction;Balance training    OT Goals(Current goals can be found in the care plan section) Acute Rehab OT Goals Patient Stated Goal: get better OT Goal Formulation: With patient Time For Goal Achievement: 04/14/23 Potential to Achieve Goals: Good ADL Goals Pt Will Perform Lower Body Dressing: with min assist;sitting/lateral leans;bed level Pt Will Transfer to Toilet: with transfer board;bedside commode;with mod assist Pt Will Perform Toileting - Clothing Manipulation and hygiene: with min assist;sitting/lateral leans Additional ADL Goal #1: Pt will tolerate grooming tasks seated EOB wiht setup and supv>69min.  OT Frequency: Min 1X/week    Co-evaluation              AM-PAC OT "6 Clicks" Daily Activity     Outcome Measure Help from another person eating meals?: Total (tube feeds) Help from another  person taking care of personal grooming?: A Little Help from another person toileting, which includes using toliet, bedpan, or urinal?: A Lot Help from another person bathing (including washing, rinsing, drying)?: A Lot Help from another person to put on and taking off regular upper body clothing?: None Help from another person to put on and taking off regular lower body clothing?: A Lot 6 Click Score: 14   End of Session Nurse Communication: Mobility status  Activity Tolerance: Patient tolerated treatment well Patient left: in bed;with call bell/phone within reach;with bed alarm set  OT Visit Diagnosis: Muscle weakness (generalized) (M62.81);Other symptoms and signs involving cognitive function                Time: 1359-1420 OT Time Calculation  (min): 21 min Charges:  OT General Charges $OT Visit: 1 Visit OT Evaluation $OT Eval Moderate Complexity: 1 Mod  Arman Filter., MPH, MS, OTR/L ascom [phone] 03/31/23, 2:46 PM

## 2023-03-31 NOTE — Plan of Care (Signed)
  Problem: Education: Goal: Ability to describe self-care measures that may prevent or decrease complications (Diabetes Survival Skills Education) will improve Outcome: Progressing   Problem: Coping: Goal: Ability to adjust to condition or change in health will improve Outcome: Progressing   Problem: Health Behavior/Discharge Planning: Goal: Ability to identify and utilize available resources and services will improve Outcome: Progressing   Problem: Nutritional: Goal: Maintenance of adequate nutrition will improve Outcome: Progressing   Problem: Skin Integrity: Goal: Risk for impaired skin integrity will decrease Outcome: Progressing   Problem: Tissue Perfusion: Goal: Adequacy of tissue perfusion will improve Outcome: Progressing   Problem: Clinical Measurements: Goal: Ability to maintain clinical measurements within normal limits will improve Outcome: Progressing   Problem: Activity: Goal: Risk for activity intolerance will decrease Outcome: Progressing   Problem: Nutrition: Goal: Adequate nutrition will be maintained Outcome: Progressing   Problem: Coping: Goal: Level of anxiety will decrease Outcome: Progressing   Problem: Elimination: Goal: Will not experience complications related to bowel motility Outcome: Progressing   Problem: Pain Management: Goal: General experience of comfort will improve Outcome: Progressing   Problem: Safety: Goal: Ability to remain free from injury will improve Outcome: Progressing

## 2023-04-01 ENCOUNTER — Other Ambulatory Visit: Payer: Self-pay

## 2023-04-01 ENCOUNTER — Other Ambulatory Visit (HOSPITAL_COMMUNITY): Payer: Self-pay

## 2023-04-01 ENCOUNTER — Ambulatory Visit: Admit: 2023-04-01 | Payer: Medicare Other

## 2023-04-01 ENCOUNTER — Telehealth (HOSPITAL_COMMUNITY): Payer: Self-pay | Admitting: Pharmacy Technician

## 2023-04-01 ENCOUNTER — Telehealth (INDEPENDENT_AMBULATORY_CARE_PROVIDER_SITE_OTHER): Payer: Self-pay

## 2023-04-01 ENCOUNTER — Ambulatory Visit: Payer: Medicare Other

## 2023-04-01 DIAGNOSIS — I4719 Other supraventricular tachycardia: Secondary | ICD-10-CM

## 2023-04-01 DIAGNOSIS — E440 Moderate protein-calorie malnutrition: Secondary | ICD-10-CM | POA: Diagnosis not present

## 2023-04-01 DIAGNOSIS — A0472 Enterocolitis due to Clostridium difficile, not specified as recurrent: Secondary | ICD-10-CM | POA: Diagnosis not present

## 2023-04-01 DIAGNOSIS — E875 Hyperkalemia: Secondary | ICD-10-CM | POA: Diagnosis not present

## 2023-04-01 LAB — GLUCOSE, CAPILLARY
Glucose-Capillary: 175 mg/dL — ABNORMAL HIGH (ref 70–99)
Glucose-Capillary: 183 mg/dL — ABNORMAL HIGH (ref 70–99)
Glucose-Capillary: 196 mg/dL — ABNORMAL HIGH (ref 70–99)
Glucose-Capillary: 208 mg/dL — ABNORMAL HIGH (ref 70–99)
Glucose-Capillary: 211 mg/dL — ABNORMAL HIGH (ref 70–99)
Glucose-Capillary: 223 mg/dL — ABNORMAL HIGH (ref 70–99)
Glucose-Capillary: 260 mg/dL — ABNORMAL HIGH (ref 70–99)

## 2023-04-01 MED ORDER — ACETAMINOPHEN 325 MG PO TABS
650.0000 mg | ORAL_TABLET | Freq: Four times a day (QID) | ORAL | Status: DC | PRN
  Administered 2023-04-03 – 2023-04-04 (×2): 650 mg via ORAL
  Filled 2023-04-01 (×2): qty 2

## 2023-04-01 MED ORDER — NUTRISOURCE FIBER PO PACK
1.0000 | PACK | Freq: Two times a day (BID) | ORAL | Status: DC
  Administered 2023-04-01 – 2023-04-03 (×6): 1
  Filled 2023-04-01 (×7): qty 1

## 2023-04-01 NOTE — Care Management Important Message (Signed)
Important Message  Patient Details  Name: [name]. MRN: [id number] Date of Birth: 07-15-1955   Important Message Given:  [name] - LOS <3 / Initial given by admissions     Olegario Messier A [name] 04/01/2023, 10:57 AM

## 2023-04-01 NOTE — Discharge Instructions (Addendum)
Home tube feeding regimen:  356 ml Glucerna 1.5 4 times daily (about 4 hours between each feed)  100 ml free water flush before each feeding and 100 ml free water flush after each after each feeding administration  Provide 30 ml free water flush before each medication administration and 30 ml free water flush after each medication administration 2 times per day  Provide 30 ml free water flush every 4 hours  Tube feeding regimen provides 2136 kcal (100% of needs), 118 grams of protein, and 1080 ml of H2O.  Total free water (including flushes): 2240 ml daily  OR  Home tube feeding regimen:  237 ml Glucerna 1.5 6 times daily (about 3 hours between each feed)  70 ml free water flush before each feeding and 70 ml free water flush after each after each feeding administration  Provide 30 ml free water flush before each medication administration and 30 ml free water flush after each medication administration 2 times per day  Provide 30 ml free water flush every 4 hours  Tube feeding regimen provides 2136 kcal (100% of needs), 118 grams of protein, and 1080 ml of H2O.  Total free water (including flushes): 2280 ml daily  **Can also provide 50 ml free water flush 2 times per day if needed for medication administration, etc

## 2023-04-01 NOTE — Telephone Encounter (Signed)
Patients wife called to reschedule his appointment for tomorrow. She stated he is in the hospital at the moment.

## 2023-04-01 NOTE — Telephone Encounter (Signed)
Patient Product/process development scientist completed.    The patient is insured through [facility]. Patient has Medicare and is not eligible for a copay card, but may be able to apply for patient assistance, if available.    Ran test claim for Dificid 200 mg and the current 10 day co-pay is $11.20.  Ran test claim for vancomycin 125 mg and the current 10 day co-pay is $1.50  This test claim was processed through [employer]- copay amounts may vary at other pharmacies due to [employer], or as the patient moves through the different stages of their insurance plan.     Roland Earl, CPHT Pharmacy Technician III Certified Patient Advocate [facility] Patient Advocate Team Direct Number: [phone]  Fax: [phone]

## 2023-04-01 NOTE — Plan of Care (Signed)

## 2023-04-01 NOTE — Plan of Care (Signed)
  Problem: Education: Goal: Ability to describe self-care measures that may prevent or decrease complications (Diabetes Survival Skills Education) will improve Outcome: Progressing   Problem: Coping: Goal: Ability to adjust to condition or change in health will improve Outcome: Progressing   Problem: Metabolic: Goal: Ability to maintain appropriate glucose levels will improve Outcome: Progressing   Problem: Nutritional: Goal: Maintenance of adequate nutrition will improve Outcome: Progressing   Problem: Skin Integrity: Goal: Risk for impaired skin integrity will decrease Outcome: Progressing   Problem: Tissue Perfusion: Goal: Adequacy of tissue perfusion will improve Outcome: Progressing   Problem: Clinical Measurements: Goal: Ability to maintain clinical measurements within normal limits will improve Outcome: Progressing   Problem: Nutrition: Goal: Adequate nutrition will be maintained Outcome: Progressing   Problem: Coping: Goal: Level of anxiety will decrease Outcome: Progressing   Problem: Elimination: Goal: Will not experience complications related to bowel motility Outcome: Progressing   Problem: Safety: Goal: Ability to remain free from injury will improve Outcome: Progressing   Problem: Skin Integrity: Goal: Risk for impaired skin integrity will decrease Outcome: Progressing

## 2023-04-01 NOTE — Progress Notes (Signed)
Nutrition Follow-up  DOCUMENTATION CODES:   Obesity unspecified, Non-severe (moderate) malnutrition in context of chronic illness  INTERVENTION:   -TF via g-tube:    356 ml Glucerna 1.5 4 times daily   115 ml free water flush before and after each feeding administration   Tube feeding regimen provides 2136 kcal (100% of needs), 118 grams of protein, and 1080 ml of H2O.  Total free water: 2000 ml daily   -Allow ice chips for dry mouth per discussion with MD  -1 packet Nutrisource Fiber BID -RD placed consult to Transitions of Care Team regarding changes to home TF regimen; RD also placed home TF recommendations in AVS/ discharge Summary  NUTRITION DIAGNOSIS:   Moderate Malnutrition related to chronic illness (esopahgeal mass, likely malginant) as evidenced by mild fat depletion, mild muscle depletion.  Ongoing  GOAL:   Patient will meet greater than or equal to 90% of their needs  Ongoing  MONITOR:   TF tolerance  REASON FOR ASSESSMENT:   Consult Enteral/tube feeding initiation and management  ASSESSMENT:   Pt with medical history significant of atrial fibrillation, asthma, congestive heart failure, COPD, coronary artery disease, depression, diabetes mellitus, gout, hyperlipidemia, hypertension, hypogonadism in male, morbid obesity, peripheral vascular disease, OSA on CPAP at night, esophageal cancer status post chemo and G-tube placement.  11/15- s/p EGD- food removal in lower third of esophagus, malignant appearing esophageal stenosis, completely obstructing, esophageal tumor in lowe third of esophagus (biopsied)  11/18- s/p Stamm gastrostomy  11/30- rectal tube placed  Reviewed I/O's: -1.3 L x 24 hours and -6 L since admission  UOP: 1.3 L x 24 hours  Case discussed with MD; pt tolerating TF well. Pt continues to have diarrhea via rectal tube (likely related to c-diff). MD is requesting additional fiber supplement to bulk up stool.   Pt receiving personal care  at time of visit.   Per IR, plan for outpatient aspiration of rt perinephritic abscess.   Medications reviewed and include augmentin, vitamin C, vitamin D3, klonopin, vitamin B12, cardizem, and ferrous sulfate.   Labs reviewed: CBGS: 128-208 (inpatient orders for glycemic control are 0-5 units insulin aspart daily at bedtime, 0-9 units insulin aspart TID with meals, and 15 units insulin glarigne-yfgn daily).    Diet Order:   Diet Order             Diet NPO time specified Except for: Ice Chips  Diet effective now                   EDUCATION NEEDS:   Education needs have been addressed  Skin:  Skin Assessment: Reviewed RN Assessment  Last BM:  03/31/23 (type 7 via rectal tube)  Height:   Ht Readings from Last 1 Encounters:  03/18/23 6\' 4"  (1.93 m)    Weight:   Wt Readings from Last 1 Encounters:  04/01/23 129.9 kg    Ideal Body Weight:  75.2 kg (adjusted for bilateral BKAs)  BMI:  Body mass index is 34.86 kg/m.  Estimated Nutritional Needs:   Kcal:  6578-4696  Protein:  115-130 grams  Fluid:  > 2 L    Levada Schilling, RD, LDN, CDCES Registered Dietitian III Certified Diabetes Care and Education Specialist Please refer to [facility] for RD and/or RD on-call/weekend/after hours pager

## 2023-04-01 NOTE — Progress Notes (Signed)
Introduced patient to role of Statistician. Attempted to complete intake questions, put appears to be a little confused and unable to fully answer questions.

## 2023-04-01 NOTE — Progress Notes (Signed)
[facility]  - [place] at [facility]   PATIENT NAME: [name]    MR#:  [id number]  DATE OF BIRTH:  Nov 26, 1955  SUBJECTIVE:  [name] at bedside. Patient is much improved per wife his mentation is back to baseline. Hemodynamically otherwise stable. Patient has rectal tube with some greenish diarrheal stools.  No fever.    VITALS:  Blood pressure 128/80, pulse (!) 126, temperature 98.3 F (36.8 C), temperature source Oral, resp. rate 20, weight 129.9 kg, SpO2 92%.  PHYSICAL EXAMINATION:   GENERAL:  67 y.o.-year-old patient with no acute distress. obese, chronically ill LUNGS:decreased breath sounds bilaterally, no wheezing CARDIOVASCULAR: S1, S2 normal. tachycardia ABDOMEN: Soft, nontender, nondistended. PEG+, rectal tube + EXTREMITIES: Bilateral amputation stump  NEUROLOGIC: nonfocal  patient is alert and awake  LABORATORY PANEL:  CBC Recent Labs  Lab 03/31/23 0320  WBC 9.4  HGB 11.3*  HCT 34.9*  PLT 256    Chemistries  Recent Labs  Lab 03/28/23 0011 03/28/23 0240 03/28/23 1035 03/31/23 0320  NA 133* 133*   < > 137  K 6.5* 6.2*   < > 4.5  CL 94* 92*   < > 102  CO2 30 30   < > 26  GLUCOSE 252* 171*   < > 145*  BUN 61* 62*   < > 38*  CREATININE 1.71* 1.74*   < > 1.53*  CALCIUM 8.6* 8.8*   < > 8.2*  MG  --  2.7*  --   --   AST 14*  --   --   --   ALT 18  --   --   --   ALKPHOS 120  --   --   --   BILITOT 0.4  --   --   --    < > = values in this interval not displayed.   Assessment and Plan  [name]. is a 67 y.o. male with medical history significant of atrial fibrillation, asthma, congestive heart failure, COPD, coronary artery disease, depression, diabetes mellitus, gout, hyperlipidemia, hypertension, hypogonadism in male, morbid obesity, peripheral vascular disease, OSA on CPAP at night, esophageal cancer status post chemo and G-tube placement.  Patient currently being managed for hyperkalemia, severe dehydration  and AKI and acute metabolic encephalopathy.   Acute kidney injury secondary to dehydration Severe dehydration Acute metabolic encephalopathy secondary to above --Avoid nephrotoxic medications --improved with IVF --cont free water thru PEG --mentation back to baseline   Acute diarrhea secondary to C. difficile infection --Continue on Dificid x 10 days --Still has some evidence of watery stool--rectal tube+ -- discussed with RN to change the rectal tube bag and monitor output for 24-hour. Discussed with dietitian to add fiber to his feeding in order to decrease the output.   Possible right perinephric abscess --Interventional radiology on board and planning outpatient aspiration as according to them this is less likely an abscess --According to urology Dr. Vanna Scotland she believes that this is chronic from previous trauma/hematoma formation --Patient was started empirically on Zosyn and this is  transitioned to Augmentin since no clinical s/o sepsis   Severe life-threatening hyperkalemia-improved --resolved --K 4.5    Chronic persistent atrial fibrillation Tachycardia --Takes Eliquis at home --resumed Eliquis now since IR procedure not needed (per discussion by Dr Meriam Sprague with IR and Urology) --Continue amiodarone --resume Diltiazem    Chronic diastolic congestive heart failure --Recent EF of 55% on echo obtained on November 2024  COPD --Continue as needed nebulization  Coronary artery disease,  --Continue aspirin therapy   Diabetes mellitus type 2 with hyperglycemia --Continue insulin therapy  Hypothyroidism --on levothyroxine     Nutrition Status: Nutrition Problem: Moderate Malnutrition Etiology: chronic illness (esopahgeal mass, likely malginant) Signs/Symptoms: mild fat depletion, mild muscle depletion Interventions: Tube feeding  Peripheral vascular disease --Continue aspirin  --Patient is allergic to statin therapy   OSA on CPAP at night,  --CPAP at  night   Esophageal cancer status post chemo and G-tube placement. --pt gets Bolus TF      Advance Care Planning:   Code Status: Prior full code  Consults: Interventional radiology, urology  Family Communication: [name] at bedside DVT Prophylaxis :Eliquis Level of care: Telemetry Medical Status is: Inpatient Remains inpatient appropriate because: C diff diarrhea    TOTAL TIME TAKING CARE OF THIS PATIENT: 35 minutes.  >50% time spent on counselling and coordination of care  Note: This dictation was prepared with Dragon dictation along with smaller phrase technology. Any transcriptional errors that result from this process are unintentional.  Enedina Finner M.D    [facility]   CC: Primary care physician; [name], MD

## 2023-04-02 ENCOUNTER — Ambulatory Visit (INDEPENDENT_AMBULATORY_CARE_PROVIDER_SITE_OTHER): Payer: Medicare Other | Admitting: Vascular Surgery

## 2023-04-02 ENCOUNTER — Ambulatory Visit: Payer: Medicare Other

## 2023-04-02 DIAGNOSIS — E440 Moderate protein-calorie malnutrition: Secondary | ICD-10-CM | POA: Diagnosis not present

## 2023-04-02 DIAGNOSIS — A0472 Enterocolitis due to Clostridium difficile, not specified as recurrent: Secondary | ICD-10-CM | POA: Diagnosis not present

## 2023-04-02 DIAGNOSIS — C159 Malignant neoplasm of esophagus, unspecified: Secondary | ICD-10-CM | POA: Diagnosis not present

## 2023-04-02 LAB — CULTURE, BLOOD (ROUTINE X 2)
Culture: NO GROWTH
Culture: NO GROWTH

## 2023-04-02 LAB — GLUCOSE, CAPILLARY
Glucose-Capillary: 214 mg/dL — ABNORMAL HIGH (ref 70–99)
Glucose-Capillary: 175 mg/dL — ABNORMAL HIGH (ref 70–99)
Glucose-Capillary: 192 mg/dL — ABNORMAL HIGH (ref 70–99)
Glucose-Capillary: 180 mg/dL — ABNORMAL HIGH (ref 70–99)
Glucose-Capillary: 203 mg/dL — ABNORMAL HIGH (ref 70–99)

## 2023-04-02 NOTE — Progress Notes (Signed)
[facility]  - [place] at [facility]   PATIENT NAME: [name]    MR#:  [id number]  DATE OF BIRTH:  January 07, 1956  SUBJECTIVE:  No family at bedside. Patient is much improved per wife his mentation is back to baseline. Hemodynamically otherwise stable. Patient has rectal tube with some greenish diarrheal stools.  Still having significant amount of rectal output. Have discussed with charge nurse and RN to document appropriate rectal output in order to decide when rectal tube can be removed.  No fever.    VITALS:  Blood pressure 113/78, pulse (!) 109, temperature 98.3 F (36.8 C), resp. rate 19, weight 129.5 kg, SpO2 92%.  PHYSICAL EXAMINATION:   GENERAL:  67 y.o.-year-old patient with no acute distress. obese, chronically ill LUNGS:decreased breath sounds bilaterally, no wheezing CARDIOVASCULAR: S1, S2 normal. tachycardia ABDOMEN: Soft, nontender, nondistended. PEG+, rectal tube + EXTREMITIES: Bilateral amputation stump  NEUROLOGIC: nonfocal  patient is alert and awake  LABORATORY PANEL:  CBC Recent Labs  Lab 03/31/23 0320  WBC 9.4  HGB 11.3*  HCT 34.9*  PLT 256    Chemistries  Recent Labs  Lab 03/28/23 0011 03/28/23 0240 03/28/23 1035 03/31/23 0320  NA 133* 133*   < > 137  K 6.5* 6.2*   < > 4.5  CL 94* 92*   < > 102  CO2 30 30   < > 26  GLUCOSE 252* 171*   < > 145*  BUN 61* 62*   < > 38*  CREATININE 1.71* 1.74*   < > 1.53*  CALCIUM 8.6* 8.8*   < > 8.2*  MG  --  2.7*  --   --   AST 14*  --   --   --   ALT 18  --   --   --   ALKPHOS 120  --   --   --   BILITOT 0.4  --   --   --    < > = values in this interval not displayed.   Assessment and Plan  [name]. is a 67 y.o. male with medical history significant of atrial fibrillation, asthma, congestive heart failure, COPD, coronary artery disease, depression, diabetes mellitus, gout, hyperlipidemia, hypertension, hypogonadism in male, morbid obesity, peripheral vascular  disease, OSA on CPAP at night, esophageal cancer status post chemo and G-tube placement.  Patient currently being managed for hyperkalemia, severe dehydration and AKI and acute metabolic encephalopathy.   Acute kidney injury secondary to dehydration Severe dehydration Acute metabolic encephalopathy secondary to above --Avoid nephrotoxic medications --improved with IVF --cont free water thru PEG --mentation back to baseline   Acute diarrhea secondary to C. difficile infection --Continue on Dificid x 10 days --Still has some evidence of watery stool--rectal tube+ -- discussed with RN to change the rectal tube bag and monitor output for 24-hour. Discussed with dietitian to add fiber to his feeding in order to decrease the output. --I Have discussed with charge nurse and RN to document appropriate rectal output in order to decide when rectal tube can be removed.   Possible right perinephric abscess --Interventional radiology on board and planning outpatient aspiration as according to them this is less likely an abscess --According to urology Dr. Vanna Scotland she believes that this is chronic from previous trauma/hematoma formation --Patient was started empirically on Zosyn and this is  transitioned to Augmentin since no clinical s/o sepsis   Severe life-threatening hyperkalemia-improved --resolved --K 4.5    Chronic persistent atrial fibrillation  Tachycardia --Takes Eliquis at home --resumed Eliquis now since IR procedure not needed (per discussion by Dr Meriam Sprague with IR and Urology) --Continue amiodarone --resume Diltiazem    Chronic diastolic congestive heart failure --Recent EF of 55% on echo obtained on November 2024  COPD --Continue as needed nebulization    Coronary artery disease,  --Continue aspirin therapy   Diabetes mellitus type 2 with hyperglycemia --Continue insulin therapy  Hypothyroidism --on levothyroxine     Nutrition Status: Nutrition Problem: Moderate  Malnutrition Etiology: chronic illness (esopahgeal mass, likely malginant) Signs/Symptoms: mild fat depletion, mild muscle depletion Interventions: Tube feeding  Peripheral vascular disease --Continue aspirin  --Patient is allergic to statin therapy   OSA on CPAP at night,  --CPAP at night   Esophageal cancer status post chemo and G-tube placement. --pt gets Bolus TF      Advance Care Planning:   Code Status: Prior full code  Consults: Interventional radiology, urology  Family Communication: [name] at bedside DVT Prophylaxis :Eliquis Level of care: Telemetry Medical Status is: Inpatient Remains inpatient appropriate because: C diff diarrhea. Waiting for stool to be a bit more formed before rectal tube can be discontinued for patient to go home.    TOTAL TIME TAKING CARE OF THIS PATIENT: 35 minutes.  >50% time spent on counselling and coordination of care  Note: This dictation was prepared with Dragon dictation along with smaller phrase technology. Any transcriptional errors that result from this process are unintentional.  Enedina Finner M.D    [facility]   CC: Primary care physician; [name], MD

## 2023-04-02 NOTE — Telephone Encounter (Signed)
Printed [facility] forms to be signed by Dr. Carylon Perches covermymeds), placed in MD folder to sign.

## 2023-04-02 NOTE — Progress Notes (Signed)
   04/02/23 2049  Vitals  Temp (!) 97.3 F (36.3 C)  Temp Source Oral  BP 121/79  MAP (mmHg) 93  BP Location Right Arm  BP Method Automatic  Patient Position (if appropriate) Lying  Pulse Rate (!) 121  Resp 20  Level of Consciousness  Level of Consciousness Alert  MEWS COLOR  MEWS Score Color Yellow  Oxygen Therapy  SpO2 96 %  O2 Device Room Air  MEWS Score  MEWS Temp 0  MEWS Systolic 0  MEWS Pulse 2  MEWS RR 0  MEWS LOC 0  MEWS Score 2   Vitals to be rechecked after medications administered

## 2023-04-03 ENCOUNTER — Ambulatory Visit: Payer: Medicare Other

## 2023-04-03 DIAGNOSIS — E875 Hyperkalemia: Secondary | ICD-10-CM | POA: Diagnosis not present

## 2023-04-03 LAB — BASIC METABOLIC PANEL
Anion gap: 9 (ref 5–15)
Anion gap: 8 (ref 5–15)
BUN: 37 mg/dL — ABNORMAL HIGH (ref 8–23)
BUN: 36 mg/dL — ABNORMAL HIGH (ref 8–23)
CO2: 24 mmol/L (ref 22–32)
CO2: 24 mmol/L (ref 22–32)
Calcium: 8.2 mg/dL — ABNORMAL LOW (ref 8.9–10.3)
Calcium: 8.7 mg/dL — ABNORMAL LOW (ref 8.9–10.3)
Chloride: 101 mmol/L (ref 98–111)
Chloride: 102 mmol/L (ref 98–111)
Creatinine, Ser: 1.03 mg/dL (ref 0.61–1.24)
Creatinine, Ser: 1.01 mg/dL (ref 0.61–1.24)
GFR, Estimated: >60 mL/min (ref >60)
GFR, Estimated: >60 mL/min (ref >60)
Glucose, Bld: 216 mg/dL — ABNORMAL HIGH (ref 70–99)
Glucose, Bld: 199 mg/dL — ABNORMAL HIGH (ref 70–99)
Potassium: 5.2 mmol/L — ABNORMAL HIGH (ref 3.5–5.1)
Potassium: 5.4 mmol/L — ABNORMAL HIGH (ref 3.5–5.1)
Sodium: 134 mmol/L — ABNORMAL LOW (ref 135–145)
Sodium: 134 mmol/L — ABNORMAL LOW (ref 135–145)

## 2023-04-03 LAB — GLUCOSE, CAPILLARY
Glucose-Capillary: 166 mg/dL — ABNORMAL HIGH (ref 70–99)
Glucose-Capillary: 186 mg/dL — ABNORMAL HIGH (ref 70–99)
Glucose-Capillary: 188 mg/dL — ABNORMAL HIGH (ref 70–99)
Glucose-Capillary: 191 mg/dL — ABNORMAL HIGH (ref 70–99)
Glucose-Capillary: 197 mg/dL — ABNORMAL HIGH (ref 70–99)
Glucose-Capillary: 205 mg/dL — ABNORMAL HIGH (ref 70–99)

## 2023-04-03 LAB — CBC
HCT: 34.7 % — ABNORMAL LOW (ref 39.0–52.0)
Hemoglobin: 11.2 g/dL — ABNORMAL LOW (ref 13.0–17.0)
MCH: 29.2 pg (ref 26.0–34.0)
MCHC: 32.3 g/dL (ref 30.0–36.0)
MCV: 90.6 fL (ref 80.0–100.0)
Platelets: 247 10*3/uL (ref 150–400)
RBC: 3.83 MIL/uL — ABNORMAL LOW (ref 4.22–5.81)
RDW: 14 % (ref 11.5–15.5)
WBC: 10.6 10*3/uL — ABNORMAL HIGH (ref 4.0–10.5)
nRBC: 0 % (ref 0.0–0.2)

## 2023-04-03 LAB — MAGNESIUM: Magnesium: 2.5 mg/dL — ABNORMAL HIGH (ref 1.7–2.4)

## 2023-04-03 MED ORDER — SODIUM CHLORIDE 0.9 % IV BOLUS
500.0000 mL | Freq: Once | INTRAVENOUS | Status: AC
  Administered 2023-04-03: 500 mL via INTRAVENOUS

## 2023-04-03 MED ORDER — DILTIAZEM HCL 90 MG PO TABS
90.0000 mg | ORAL_TABLET | Freq: Four times a day (QID) | ORAL | Status: DC
  Administered 2023-04-03 – 2023-04-04 (×3): 90 mg
  Filled 2023-04-03 (×6): qty 1

## 2023-04-03 MED ORDER — SODIUM ZIRCONIUM CYCLOSILICATE 10 G PO PACK
10.0000 g | PACK | Freq: Once | ORAL | Status: AC
  Administered 2023-04-03: 10 g
  Filled 2023-04-03: qty 1

## 2023-04-03 NOTE — Progress Notes (Addendum)
Physical Therapy Treatment Patient Details Name: [name]. MRN: [id number] DOB: 1956/02/05 Today's Date: 04/03/2023   History of Present Illness 67yo male pt admitted for hyperkalemia and perinephric abscess. PMH includes Afib, asthma, CHF, COPD, CAD, depression, PVD, and esophageal cancer. Currently with PEG tube placed. Of note B LE BKA.    PT Comments  Pt was supine in bed upon arrival. He is well known by author from previous admissions. Does not seem to be at his baseline cognition. He did recall Thereasa Parkin and knew he was in hospital however overall poor awareness of overall situation. Several times he said he considered standing and ambulating when pt has not been ambulatory for some time.  He was tachycardic throughout session. Resting HR 125 bpm that elevated to 138 bpm upon sitting up EOB. He tolerated sitting EOB for a few minutes prior to returning to supine and performing bed level exercises. See exercises listed below. Acute PT will continue to follow and progress as able per current POC.    If plan is discharge home, recommend the following: Two people to help with walking and/or transfers;A lot of help with bathing/dressing/bathroom;Assistance with cooking/housework;Direct supervision/assist for medications management;Direct supervision/assist for financial management;Assist for transportation;Help with stairs or ramp for entrance;Supervision due to cognitive status     Equipment Recommendations  Hospital bed (Pt hs been refusing hospital throughout last few admissions. Continued to be reommended however pt unwilling.)       Precautions / Restrictions Precautions Precautions: Fall Restrictions Weight Bearing Restrictions: No     Mobility  Bed Mobility Overal bed mobility: Needs Assistance Bed Mobility: Supine to Sit, Sit to Supine Rolling: Min assist Supine to sit: Mod assist Sit to supine: Max assist General bed mobility comments: Mod assist to achieve EOB  sitting. Max assist to return to supine after EOB sitting x ~ 3 minutes. HR elevated to 138 bpm in sitting. Returned to supine and peformed bed level ther ex   Transfers  General transfer comment: pt would only transfer safely via mechanical lift at this time. Was in OPPT prior to admission last week. 2nd admission in past month   Ambulation/Gait  General Gait Details: unable at baseline   Balance Overall balance assessment: Needs assistance Sitting-balance support: Bilateral upper extremity supported, Feet unsupported, No upper extremity supported, Single extremity supported Sitting balance-Leahy Scale: Fair Sitting balance - Comments: pt required constant CGA assist for safety.    Cognition Arousal: Alert Behavior During Therapy: WFL for tasks assessed/performed Overall Cognitive Status: Impaired/Different from baseline Area of Impairment: Orientation, Attention, Safety/judgement, Awareness, Problem solving    General Comments: Pt is well known by author from previous admissions. He is alert but disoriented. poor awareness of situation overall. " [place]        Exercises Amputee Exercises Quad Sets: AROM, 10 reps Gluteal Sets: 10 reps Hip Extension: 10 reps Hip ABduction/ADduction: AROM, 5 reps        Pertinent Vitals/Pain Pain Assessment Pain Assessment: No/denies pain     PT Goals (current goals can now be found in the care plan section) Acute Rehab PT Goals Patient Stated Goal: Get better Progress towards PT goals: Progressing toward goals    Frequency    Min 1X/week       AM-PAC PT "6 Clicks" Mobility   Outcome Measure  Help needed turning from your back to your side while in a flat bed without using bedrails?: A Little Help  needed moving from lying on your back to sitting on the side of a flat bed without using bedrails?: A Lot Help needed moving to and from a bed to a chair (including a wheelchair)?:  Total Help needed standing up from a chair using your arms (e.g., wheelchair or bedside chair)?: Total Help needed to walk in hospital room?: Total Help needed climbing 3-5 steps with a railing? : Total 6 Click Score: 9    End of Session   Activity Tolerance: Patient tolerated treatment well;Patient limited by fatigue (limited by cognition and HR concerns) Patient left: in bed;with bed alarm set;with call bell/phone within reach Nurse Communication: Mobility status PT Visit Diagnosis: Muscle weakness (generalized) (M62.81);Other abnormalities of gait and mobility (R26.89)     Time: 1610-9604 PT Time Calculation (min) (ACUTE ONLY): 24 min  Charges:    $Therapeutic Exercise: 8-22 mins $Therapeutic Activity: 8-22 mins PT General Charges $$ ACUTE PT VISIT: 1 Visit                    Jetta Lout PTA 04/03/23, 3:29 PM

## 2023-04-03 NOTE — Plan of Care (Signed)

## 2023-04-03 NOTE — Telephone Encounter (Signed)
Letter of approval scanned under media tab.

## 2023-04-03 NOTE — Progress Notes (Signed)
   04/03/23 0610  Assess: MEWS Score  Temp 97.9 F (36.6 C)  BP (!) 121/91  MAP (mmHg) 100  Pulse Rate (!) 123  Resp 18  Level of Consciousness Alert  SpO2 94 %  O2 Device Room Air  Assess: MEWS Score  MEWS Temp 0  MEWS Systolic 0  MEWS Pulse 2  MEWS RR 0  MEWS LOC 0  MEWS Score 2  MEWS Score Color Yellow  Assess: SIRS CRITERIA  SIRS Temperature  0  SIRS Pulse 1  SIRS Respirations  0  SIRS WBC 0  SIRS Score Sum  1   Continue Q4 Vitals

## 2023-04-03 NOTE — Progress Notes (Addendum)
[facility]  - [place] at [facility]   PATIENT NAME: [name]    MR#:  [id number]  DATE OF BIRTH:  March 06, 1956  SUBJECTIVE:  No pain or fever, is unsure why has foley    VITALS:  Blood pressure 125/81, pulse (!) 120, temperature 98.2 F (36.8 C), resp. rate 20, height 5\' 5"  (1.651 m), weight 127.9 kg, SpO2 90%.  PHYSICAL EXAMINATION:   GENERAL:  67 y.o.-year-old patient with no acute distress. obese, chronically ill LUNGS:decreased breath sounds bilaterally, no wheezing CARDIOVASCULAR: S1, S2 normal. tachycardia ABDOMEN: Soft, nontender, nondistended. PEG+, rectal tube + EXTREMITIES: Bilateral amputation stump  NEUROLOGIC: nonfocal  patient is alert and awake  LABORATORY PANEL:  CBC Recent Labs  Lab 04/03/23 0142  WBC 10.6*  HGB 11.2*  HCT 34.7*  PLT 247    Chemistries  Recent Labs  Lab 03/28/23 0011 03/28/23 0240 04/03/23 0142  NA 133*   < > 134*  K 6.5*   < > 5.2*  CL 94*   < > 101  CO2 30   < > 24  GLUCOSE 252*   < > 216*  BUN 61*   < > 37*  CREATININE 1.71*   < > 1.03  CALCIUM 8.6*   < > 8.2*  MG  --    < > 2.5*  AST 14*  --   --   ALT 18  --   --   ALKPHOS 120  --   --   BILITOT 0.4  --   --    < > = values in this interval not displayed.   Assessment and Plan  Ramona A Mihcael Bouchie. is a 67 y.o. male with medical history significant of atrial fibrillation, asthma, congestive heart failure, COPD, coronary artery disease, depression, diabetes mellitus, gout, hyperlipidemia, hypertension, hypogonadism in male, morbid obesity, peripheral vascular disease, OSA on CPAP at night, esophageal cancer status post chemo and G-tube placement.  Patient currently being managed for hyperkalemia, severe dehydration and AKI and acute metabolic encephalopathy.   Acute kidney injury secondary to dehydration Severe dehydration Acute metabolic encephalopathy secondary to above --Avoid nephrotoxic medications --improved with IVF --cont free  water thru PEG --mentation back to baseline   Acute diarrhea secondary to C. difficile infection --Continue on Dificid x 10 days --Still has some evidence of watery stool--rectal tube+ -- discussed with RN to change the rectal tube bag and monitor output for 24-hour. Discussed with dietitian to add fiber to his feeding in order to decrease the output. --I Have discussed with charge nurse and RN to document appropriate rectal output in order to decide when rectal tube can be removed. - gi consult tomorrow if diarrhea persists   Possible right perinephric abscess --Interventional radiology on board and planning outpatient aspiration as according to them this is less likely an abscess --According to urology Dr. Vanna Scotland she believes that this is chronic from previous trauma/hematoma formation   Severe life-threatening hyperkalemia-improved 5.2 today, repeat 5.4 - lokelma - 500 cc bolus    Chronic persistent atrial fibrillation Tachycardia --Takes Eliquis at home --resumed Eliquis now since IR procedure not needed (per discussion by Dr Meriam Sprague with IR and Urology) --Continue amiodarone --increase dilt dose  Urinary retention Urology advises maintaining, outpt voiding trial   Chronic diastolic congestive heart failure --Recent EF of 55% on echo obtained on November 2024  COPD --Continue as needed nebulization    Coronary artery disease,  --Continue aspirin therapy   Diabetes mellitus type 2  with hyperglycemia --Continue insulin therapy  Hypothyroidism --on levothyroxine     Nutrition Status: Nutrition Problem: Moderate Malnutrition Etiology: chronic illness (esopahgeal mass, likely malginant) Signs/Symptoms: mild fat depletion, mild muscle depletion Interventions: Tube feeding  Peripheral vascular disease --Continue aspirin  --Patient is allergic to statin therapy   OSA on CPAP at night,  --CPAP at night   Esophageal cancer status post chemo and G-tube  placement. --pt gets Bolus TF      Advance Care Planning:   Code Status: Prior full code  Consults: Interventional radiology, urology  Family Communication: none at bedside DVT Prophylaxis :Eliquis Level of care: Telemetry Medical Status is: Inpatient Remains inpatient appropriate because: C diff diarrhea persists    Silvano Bilis M.D    [facility]

## 2023-04-03 NOTE — Plan of Care (Signed)

## 2023-04-03 NOTE — Progress Notes (Signed)
   04/03/23 0210  Assess: MEWS Score  Temp 98 F (36.7 C)  BP 121/81  MAP (mmHg) 94  Pulse Rate (!) 125  Resp 18  Level of Consciousness Alert  SpO2 94 %  O2 Device Room Air  Assess: MEWS Score  MEWS Temp 0  MEWS Systolic 0  MEWS Pulse 2  MEWS RR 0  MEWS LOC 0  MEWS Score 2  MEWS Score Color Yellow  Assess: if the MEWS score is Yellow or Red  Were vital signs accurate and taken at a resting state? Yes  Does the patient meet 2 or more of the SIRS criteria? No  Does the patient have a confirmed or suspected source of infection? Yes  MEWS guidelines implemented  No, previously yellow, continue vital signs every 4 hours  Notify: Charge Nurse/RN  Name of Charge Nurse/RN Notified Mary, RN  Assess: SIRS CRITERIA  SIRS Temperature  0  SIRS Pulse 1  SIRS Respirations  0  SIRS WBC 0  SIRS Score Sum  1   Q4 Vitals starting at [phone]

## 2023-04-03 NOTE — Progress Notes (Signed)
   04/03/23 0009  Assess: MEWS Score  Temp 98.9 F (37.2 C)  BP (!) 92/59  MAP (mmHg) 71  Pulse Rate (!) 124  Resp 18  Level of Consciousness Alert  SpO2 93 %  O2 Device Room Air  Assess: MEWS Score  MEWS Temp 0  MEWS Systolic 1  MEWS Pulse 2  MEWS RR 0  MEWS LOC 0  MEWS Score 3  MEWS Score Color Yellow  Assess: SIRS CRITERIA  SIRS Temperature  0  SIRS Pulse 1  SIRS Respirations  0  SIRS WBC 0  SIRS Score Sum  1   Yellow MEWS started

## 2023-04-03 NOTE — Telephone Encounter (Signed)
Forms faxed to [facility].

## 2023-04-04 DIAGNOSIS — I4892 Unspecified atrial flutter: Secondary | ICD-10-CM

## 2023-04-04 DIAGNOSIS — E875 Hyperkalemia: Secondary | ICD-10-CM | POA: Diagnosis not present

## 2023-04-04 LAB — GLUCOSE, CAPILLARY
Glucose-Capillary: 157 mg/dL — ABNORMAL HIGH (ref 70–99)
Glucose-Capillary: 176 mg/dL — ABNORMAL HIGH (ref 70–99)
Glucose-Capillary: 179 mg/dL — ABNORMAL HIGH (ref 70–99)
Glucose-Capillary: 193 mg/dL — ABNORMAL HIGH (ref 70–99)
Glucose-Capillary: 198 mg/dL — ABNORMAL HIGH (ref 70–99)
Glucose-Capillary: 201 mg/dL — ABNORMAL HIGH (ref 70–99)

## 2023-04-04 LAB — BASIC METABOLIC PANEL
Anion gap: 9 (ref 5–15)
BUN: 33 mg/dL — ABNORMAL HIGH (ref 8–23)
CO2: 24 mmol/L (ref 22–32)
Calcium: 8.6 mg/dL — ABNORMAL LOW (ref 8.9–10.3)
Chloride: 101 mmol/L (ref 98–111)
Creatinine, Ser: 1.05 mg/dL (ref 0.61–1.24)
GFR, Estimated: >60 mL/min (ref >60)
Glucose, Bld: 193 mg/dL — ABNORMAL HIGH (ref 70–99)
Potassium: 5 mmol/L (ref 3.5–5.1)
Sodium: 134 mmol/L — ABNORMAL LOW (ref 135–145)

## 2023-04-04 LAB — CBC
HCT: 36 % — ABNORMAL LOW (ref 39.0–52.0)
Hemoglobin: 11.7 g/dL — ABNORMAL LOW (ref 13.0–17.0)
MCH: 29.8 pg (ref 26.0–34.0)
MCHC: 32.5 g/dL (ref 30.0–36.0)
MCV: 91.8 fL (ref 80.0–100.0)
Platelets: 276 10*3/uL (ref 150–400)
RBC: 3.92 MIL/uL — ABNORMAL LOW (ref 4.22–5.81)
RDW: 14.1 % (ref 11.5–15.5)
WBC: 10.2 10*3/uL (ref 4.0–10.5)
nRBC: 0 % (ref 0.0–0.2)

## 2023-04-04 MED ORDER — ONDANSETRON 4 MG PO TBDP
4.0000 mg | ORAL_TABLET | Freq: Four times a day (QID) | ORAL | Status: DC | PRN
  Administered 2023-04-04: 4 mg via ORAL
  Filled 2023-04-04: qty 1

## 2023-04-04 MED ORDER — SODIUM ZIRCONIUM CYCLOSILICATE 5 G PO PACK
5.0000 g | PACK | Freq: Once | ORAL | Status: AC
  Administered 2023-04-04: 5 g
  Filled 2023-04-04: qty 1

## 2023-04-04 MED ORDER — AMIODARONE HCL IN DEXTROSE 360-4.14 MG/200ML-% IV SOLN
60.0000 mg/h | INTRAVENOUS | Status: DC
  Filled 2023-04-04: qty 200

## 2023-04-04 MED ORDER — DILTIAZEM HCL-DEXTROSE 125-5 MG/125ML-% IV SOLN (PREMIX)
5.0000 mg/h | INTRAVENOUS | Status: DC
Start: 2023-04-04 — End: 2023-04-05
  Administered 2023-04-04: 5 mg/h via INTRAVENOUS
  Administered 2023-04-05: 15 mg/h via INTRAVENOUS
  Filled 2023-04-04 (×2): qty 125

## 2023-04-04 MED ORDER — BANATROL TF EN LIQD
60.0000 mL | Freq: Two times a day (BID) | ENTERAL | Status: DC
  Administered 2023-04-04 – 2023-04-05 (×2): 60 mL

## 2023-04-04 MED ORDER — AMIODARONE HCL IN DEXTROSE 360-4.14 MG/200ML-% IV SOLN
30.0000 mg/h | INTRAVENOUS | Status: DC
  Filled 2023-04-04: qty 200

## 2023-04-04 MED ORDER — AMIODARONE LOAD VIA INFUSION
150.0000 mg | Freq: Once | INTRAVENOUS | Status: DC
  Filled 2023-04-04: qty 83.34

## 2023-04-04 MED ORDER — COLESTIPOL HCL 5 G PO PACK
5.0000 g | PACK | Freq: Two times a day (BID) | ORAL | Status: DC
  Administered 2023-04-04 – 2023-04-07 (×7): 5 g
  Filled 2023-04-04 (×7): qty 1

## 2023-04-04 MED ORDER — ONDANSETRON HCL 4 MG/2ML IJ SOLN
4.0000 mg | Freq: Once | INTRAMUSCULAR | Status: AC
  Administered 2023-04-04: 4 mg via INTRAVENOUS
  Filled 2023-04-04: qty 2

## 2023-04-04 MED ORDER — VITAMIN B-12 1000 MCG PO TABS
10000.0000 ug | ORAL_TABLET | Freq: Every day | ORAL | Status: DC
  Administered 2023-04-04 – 2023-04-14 (×11): 10000 ug
  Filled 2023-04-04 (×11): qty 10

## 2023-04-04 MED ORDER — METOPROLOL TARTRATE 25 MG PO TABS
12.5000 mg | ORAL_TABLET | Freq: Four times a day (QID) | ORAL | Status: DC
  Administered 2023-04-04 – 2023-04-06 (×7): 12.5 mg
  Filled 2023-04-04 (×7): qty 1

## 2023-04-04 MED ORDER — INSULIN GLARGINE-YFGN 100 UNIT/ML ~~LOC~~ SOLN
18.0000 [IU] | Freq: Every day | SUBCUTANEOUS | Status: DC
Start: 2023-04-04 — End: 2023-04-05
  Administered 2023-04-04: 18 [IU] via SUBCUTANEOUS
  Filled 2023-04-04 (×2): qty 0.18

## 2023-04-04 NOTE — Progress Notes (Signed)
Physical Therapy Treatment Patient Details Name: [name]. MRN: [id number] DOB: 06-25-1955 Today's Date: 04/04/2023   History of Present Illness 67yo male pt admitted for hyperkalemia and perinephric abscess. PMH includes Afib, asthma, CHF, COPD, CAD, depression, PVD, and esophageal cancer. Currently with PEG tube placed. Of note B LE BKA.    PT Comments  Pt was supine in bed attempting to use phone but struggling. " Im trying to call my wife." Chartered loss adjuster assisted pt with reach spouse at conclusion of session. Overall pt still is disoriented and confused. [place] and that he was walking in [place] earlier. Author did try to reorient pt but he remains somewhat disoriented. Pt is cooperative and does follow commands. He tolerated sitting EOB and performing bed level exercises. Pt wants to return home with spouse at DC. He was mostly bed bound<> w/c bound prior to admission. Acute PT will continue to follow and progress as able per current POC. Noted leakage from rectal tube at conclusion of session.     If plan is discharge home, recommend the following: Two people to help with walking and/or transfers;A lot of help with bathing/dressing/bathroom;Assistance with cooking/housework;Direct supervision/assist for medications management;Direct supervision/assist for financial management;Assist for transportation;Help with stairs or ramp for entrance;Supervision due to cognitive status     Equipment Recommendations  Hospital bed       Precautions / Restrictions Precautions Precautions: Fall Precaution Comments: BLE AKA, no prosthetics in room Restrictions Weight Bearing Restrictions: No RLE Weight Bearing: Weight bearing as tolerated LLE Weight Bearing: Weight bearing as tolerated     Mobility  Bed Mobility Overal bed mobility: Needs Assistance Bed Mobility: Supine to Sit, Sit to Supine  Supine to sit: Mod assist Sit to supine: Mod assist   Transfers   General transfer comment: pt fatigued with minimal EOB sitting. Elected not to attempt OOB    Ambulation/Gait  General Gait Details: unable at baseline   Balance Overall balance assessment: Needs assistance Sitting-balance support: Feet unsupported, No upper extremity supported, Single extremity supported Sitting balance-Leahy Scale: Fair     Cognition Arousal: Alert Behavior During Therapy: WFL for tasks assessed/performed, Anxious Overall Cognitive Status: Impaired/Different from baseline Area of Impairment: Orientation, Following commands, Safety/judgement, Awareness, Problem solving    Orientation Level: Disoriented to, Place, Time, Situation   General Comments: Pt is alert but confused. Remains far from baseline cognition               Pertinent Vitals/Pain Pain Assessment Pain Assessment: 0-10 Pain Score: 4  Pain Location: stomach     PT Goals (current goals can now be found in the care plan section) Acute Rehab PT Goals Patient Stated Goal: Get better Progress towards PT goals: Not progressing toward goals - comment    Frequency    Min 1X/week       AM-PAC PT "6 Clicks" Mobility   Outcome Measure  Help needed turning from your back to your side while in a flat bed without using bedrails?: A Little Help needed moving from lying on your back to sitting on the side of a flat bed without using bedrails?: A Lot Help needed moving to and from a bed to a chair (including a wheelchair)?: Total Help needed standing up from a chair using your arms (e.g., wheelchair or bedside chair)?: Total Help needed to walk in hospital room?: Total Help needed climbing 3-5 steps with a railing? : Total 6 Click Score: 9  End of Session   Activity Tolerance: Patient tolerated treatment well Patient left: in bed;with bed alarm set;with call bell/phone within reach Nurse Communication: Mobility status PT Visit Diagnosis: Muscle weakness (generalized) (M62.81);Other  abnormalities of gait and mobility (R26.89)     Time: 1610-9604 PT Time Calculation (min) (ACUTE ONLY): 15 min  Charges:    $Therapeutic Activity: 8-22 mins PT General Charges $$ ACUTE PT VISIT: 1 Visit                     Jetta Lout PTA 04/04/23, 4:28 PM

## 2023-04-04 NOTE — Progress Notes (Addendum)
[facility]  - [facility] at [facility]   PATIENT NAME: [name]    MR#:  [id number]  DATE OF BIRTH:  Oct 18, 1955  SUBJECTIVE:  No pain or fever, tolerating diet, no complaints, no palpitations or lightheadedness    VITALS:  Blood pressure 99/77, pulse (!) 121, temperature 98.1 F (36.7 C), resp. rate 18, height 5\' 5"  (1.651 m), weight 127.3 kg, SpO2 93%.  PHYSICAL EXAMINATION:   GENERAL:  67 y.o.-year-old patient with no acute distress. obese, chronically ill LUNGS:decreased breath sounds bilaterally, no wheezing CARDIOVASCULAR: S1, S2 normal. tachycardia ABDOMEN: Soft, nontender, nondistended. PEG+, rectal tube + EXTREMITIES: Bilateral amputation stump  NEUROLOGIC: nonfocal  patient is alert and awake  LABORATORY PANEL:  CBC Recent Labs  Lab 04/04/23 0416  WBC 10.2  HGB 11.7*  HCT 36.0*  PLT 276    Chemistries  Recent Labs  Lab 04/03/23 0142 04/03/23 1602 04/04/23 0416  NA 134*   < > 134*  K 5.2*   < > 5.0  CL 101   < > 101  CO2 24   < > 24  GLUCOSE 216*   < > 193*  BUN 37*   < > 33*  CREATININE 1.03   < > 1.05  CALCIUM 8.2*   < > 8.6*  MG 2.5*  --   --    < > = values in this interval not displayed.   Assessment and Plan  [name]. is a 67 y.o. male with medical history significant of atrial fibrillation, asthma, congestive heart failure, COPD, coronary artery disease, depression, diabetes mellitus, gout, hyperlipidemia, hypertension, hypogonadism in male, morbid obesity, peripheral vascular disease, OSA on CPAP at night, esophageal cancer status post chemo and G-tube placement.  Patient currently being managed for hyperkalemia, severe dehydration and AKI and acute metabolic encephalopathy.   Acute kidney injury secondary to dehydration Severe dehydration Acute metabolic encephalopathy secondary to above Appears resolved --Avoid nephrotoxic medications --cont free water thru PEG   Acute diarrhea secondary to C.  difficile infection --Continue on Dificid x 10 days --Still has evidence of watery stool--rectal tube+ -- have added fiber to tube feeds, today after discussion w/ gi will add banatrol and colestid, formal consult tomorrow if not improving, consider also involving ID   Possible right perinephric abscess --Interventional radiology on board and planning outpatient aspiration as according to them this is less likely an abscess --According to urology Dr. Vanna Scotland she believes that this is chronic from previous trauma/hematoma formation   Severe life-threatening hyperkalemia-improved 5 today after lokelma - continue  - 500 cc bolus    Chronic persistent atrial fibrillation Tachycardia --Takes Eliquis at home --resumed Eliquis now since IR procedure not needed  -- currently on amio and increased dose of dilt but pressures are soft and remains tachycardic to 120s -- will involve cardiology   Urinary retention Urology advises TOV today as has had foley for 1 week so have started that   Chronic diastolic congestive heart failure --Recent EF of 55% on echo obtained on November 2024  COPD --Continue as needed nebulization    Coronary artery disease,  --Continue aspirin therapy   Diabetes mellitus type 2 with hyperglycemia --Continue insulin therapy but will increase basal from 15 to 18  Hypothyroidism --on levothyroxine     Nutrition Status: Nutrition Problem: Moderate Malnutrition Etiology: chronic illness (esopahgeal mass, likely malginant) Signs/Symptoms: mild fat depletion, mild muscle depletion Interventions: Tube feeding  Peripheral vascular disease --Continue aspirin  --Patient is  allergic to statin therapy   OSA on CPAP at night,  --CPAP at night   Esophageal cancer status post chemo and G-tube placement. --pt gets Bolus TF      Advance Care Planning:   Code Status: Prior full code  Consults: Interventional radiology, urology  Family Communication: none at  bedside. No answer when wife called today. DVT Prophylaxis :Eliquis Level of care: Telemetry Medical Status is: Inpatient Remains inpatient appropriate because: C diff diarrhea persists    Silvano Bilis M.D    [facility]

## 2023-04-04 NOTE — Progress Notes (Signed)
Nutrition Follow-up  DOCUMENTATION CODES:   Obesity unspecified, Non-severe (moderate) malnutrition in context of chronic illness  INTERVENTION:   -TF via g-tube:    356 ml Glucerna 1.5 4 times daily   115 ml free water flush before and after each feeding administration   Tube feeding regimen provides 2136 kcal (100% of needs), 118 grams of protein, and 1080 ml of H2O.  Total free water: 2000 ml daily   -Allow ice chips for dry mouth per discussion with MD  -1 packet Banatrol BID  NUTRITION DIAGNOSIS:   Moderate Malnutrition related to chronic illness (esopahgeal mass, likely malginant) as evidenced by mild fat depletion, mild muscle depletion.  Ongoing  GOAL:   Patient will meet greater than or equal to 90% of their needs  Progressing   MONITOR:   TF tolerance  REASON FOR ASSESSMENT:   Consult Enteral/tube feeding initiation and management  ASSESSMENT:   Pt with medical history significant of atrial fibrillation, asthma, congestive heart failure, COPD, coronary artery disease, depression, diabetes mellitus, gout, hyperlipidemia, hypertension, hypogonadism in male, morbid obesity, peripheral vascular disease, OSA on CPAP at night, esophageal cancer status post chemo and G-tube placement.  11/15- s/p EGD- food removal in lower third of esophagus, malignant appearing esophageal stenosis, completely obstructing, esophageal tumor in lowe third of esophagus (biopsied)  11/18- s/p Stamm gastrostomy  11/30- rectal tube placed  Reviewed I/O's: -2.4 L x 24 hours and -11.9 L since admission  UOP: 2.2 L x 24 hours  Rectal tube output: 200 ml x 24 hours  Reviewed I/O's: -2.4 L x 24 hours and -11.9 L since admission   Pt sleeping soundly at time of visit. He did not respond to voice. Wife not present in room today.   Case discussed with MD. Pt tolerating TF well, but continues to have diarrhea via rectal tube, which is precluding discharge. Per MD, discussed with GU, who  is recommending banana flakes. Formulary equivalent for this is Banatrol, which is known to help with c-diff diarrhea per manufacturer's instructions. RD will add Banatrol.   Medications reviewed and include vitamin C, vitamin D3, vitamin B-12, cardizem, and ferrous sulfate.  Labs reviewed: CBGS: 166-201 (inpatient orders for glycemic control are 0-5 units insulin aspart TID with meals, and and 15 units insulin glargine-yfgn daily).    Diet Order:   Diet Order             Diet NPO time specified Except for: Ice Chips  Diet effective now                   EDUCATION NEEDS:   Education needs have been addressed  Skin:  Skin Assessment: Reviewed RN Assessment  Last BM:  03/31/23 (type 7 via rectal tube)  Height:   Ht Readings from Last 1 Encounters:  04/02/23 5\' 5"  (1.651 m)    Weight:   Wt Readings from Last 1 Encounters:  04/04/23 127.3 kg    Ideal Body Weight:  75.2 kg (adjusted for bilateral BKAs)  BMI:  Body mass index is 46.7 kg/m.  Estimated Nutritional Needs:   Kcal:  1610-9604  Protein:  115-130 grams  Fluid:  > 2 L    Levada Schilling, RD, LDN, CDCES Registered Dietitian III Certified Diabetes Care and Education Specialist Please refer to [facility] for RD and/or RD on-call/weekend/after hours pager

## 2023-04-04 NOTE — Progress Notes (Signed)
Occupational Therapy Treatment Patient Details Name: [name]. MRN: [id number] DOB: [birth year] Today's Date: 04/04/2023   History of present illness 67yo male pt admitted for hyperkalemia and perinephric abscess. PMH includes Afib, asthma, CHF, COPD, CAD, depression, PVD, and esophageal cancer. Currently with PEG tube placed. Of note B LE BKA.   OT comments  [name] was seen for OT treatment on this date. Upon arrival to room pt in bed, agreeable to tx. Continues to be confused, [place] Pt requires MOD A exit bed, SUPERVISION seated grooming tasks. MIN A lateral scoot along EOB. MAX A return to bed. Pt making progress toward goals, will continue to follow POC. Discharge recommendation remains appropriate.       If plan is discharge home, recommend the following:  A little help with bathing/dressing/bathroom;Assistance with cooking/housework;Assist for transportation;Help with stairs or ramp for entrance;A lot of help with walking and/or transfers;Assistance with feeding;Direct supervision/assist for medications management;Supervision due to cognitive status   Equipment Recommendations  Hospital bed    Recommendations for Other Services      Precautions / Restrictions Precautions Precautions: Fall Precaution Comments: prosthetic not at hospital at this time Restrictions Weight Bearing Restrictions: No       Mobility Bed Mobility Overal bed mobility: Needs Assistance Bed Mobility: Supine to Sit, Sit to Supine     Supine to sit: Mod assist Sit to supine: Min assist        Transfers Overall transfer level: Needs assistance   Transfers: Bed to chair/wheelchair/BSC            Lateral/Scoot Transfers: Min assist General transfer comment: along EOB     Balance Overall balance assessment: Needs assistance Sitting-balance support: Feet unsupported, No upper extremity supported, Single extremity supported Sitting balance-Leahy Scale:  Fair                                     ADL either performed or assessed with clinical judgement   ADL Overall ADL's : Needs assistance/impaired                                       General ADL Comments: SETUP + SUPERVISION seated grooming tasks      Cognition Arousal: Alert Behavior During Therapy: WFL for tasks assessed/performed Overall Cognitive Status: Impaired/Different from baseline Area of Impairment: Orientation                 Orientation Level: Disoriented to, Place, Time             General Comments: [place]                   Pertinent Vitals/ Pain       Pain Assessment Pain Assessment: No/denies pain   Frequency  Min 1X/week        Progress Toward Goals  OT Goals(current goals can now be found in the care plan section)  Progress towards OT goals: Progressing toward goals  Acute Rehab OT Goals OT Goal Formulation: With patient Time For Goal Achievement: 04/14/23 Potential to Achieve Goals: Good ADL Goals Pt Will Perform Lower Body Dressing: with min assist;sitting/lateral leans;bed level Pt Will Transfer to Toilet: with transfer board;bedside commode;with mod assist Pt Will Perform Toileting - Clothing Manipulation and hygiene:  with min assist;sitting/lateral leans Additional ADL Goal #1: Pt will tolerate grooming tasks seated EOB wiht setup and supv>36min.  Plan      Co-evaluation                 AM-PAC OT "6 Clicks" Daily Activity     Outcome Measure   Help from another person eating meals?: None Help from another person taking care of personal grooming?: A Little Help from another person toileting, which includes using toliet, bedpan, or urinal?: A Lot Help from another person bathing (including washing, rinsing, drying)?: A Lot Help from another person to put on and taking off regular upper body clothing?: None Help from another person to put on and taking  off regular lower body clothing?: A Lot 6 Click Score: 17    End of Session    OT Visit Diagnosis: Muscle weakness (generalized) (M62.81);Other symptoms and signs involving cognitive function   Activity Tolerance Patient tolerated treatment well   Patient Left in bed;with call bell/phone within reach;with bed alarm set   Nurse Communication          Time: 4098-1191 OT Time Calculation (min): 18 min  Charges: OT General Charges $OT Visit: 1 Visit OT Treatments $Self Care/Home Management : 8-22 mins  Kathie Dike, M.S. OTR/L  04/04/23, 2:55 PM  ascom [phone]

## 2023-04-04 NOTE — Consult Note (Signed)
Cardiology Consultation   Patient ID: [name]. MRN: [id number]; DOB: 1956/01/10  Admit date: 03/28/2023 Date of Consult: 04/04/2023  PCP:  [name], MD   [facility] Providers Cardiologist:  Lorine Bears, MD   {   Patient Profile:   [name]. is a 67 y.o. male with a hx of frequent falls, PAD s/p b/l BKA, DM2, afib RVR with prior cardioversion, moderate LVH, CAD with CABG in 2006, HTN, esophageal cancer s/p chemo and G-tube placement, and CKD who is being seen 04/04/2023 for the evaluation of aflutter RVR at the request of Dr. Ashok Pall.  History of Present Illness:   [name] is followed by Dr. Kirke Corin for the above cardiac issues. He has a history of CAD with CABG in 2006 with LIMA to LAD, VG to the OM. Cardiac cath in 2021 for chest pain showed occluded VG and OM, patent LIMA to LAD and 89% mid RCA stenosis and underwent DES placement to the RCA. He has a h/o of afib on long-term a/c with Eliquis.   Last seen by Dr. Mariah Milling in 03/17/2023 for pre-op evaluation prior to PEG tube placement. He has dysphagia with obstructive esophageal mass  The patient was admitted 03/28/23 with AKI 2/2 dehydration, hyperkalemia, acute diarrhea 2/2 Cdiff, possible right perinephric abscess, and chronic persistent afib with difficult to control rates. Cardiology was asked to see to help with afib management. Appears he was in NSR on admission.  Labs: sodium 134, K 5.0, Scr 1.05, Bun 33, WBC 10.27m Hgb 11.7.  EKG from 12/4/shows tachycardia with wide QRS, HR 120s. Appears to be aflutter.   Past Medical History:  Diagnosis Date   Arrhythmia    atrial fibrillation   Asthma    CHF (congestive heart failure) (HCC)    COPD (chronic obstructive pulmonary disease) (HCC)    Coronary artery disease    Depression    Diabetes mellitus without complication (HCC)    Gout    History anabolic steroid use    Hyperlipidemia    Hypertension    Hypogonadism in male     MI (myocardial infarction) (HCC)    Morbid obesity (HCC)    Myocardial infarction (HCC)    Peripheral vascular disease (HCC)    Perirectal abscess    Pleurisy    Sleep apnea    CPAP at night, no oxygen   Varicella     Past Surgical History:  Procedure Laterality Date   ABDOMINAL AORTIC ANEURYSM REPAIR     ACHILLES TENDON SURGERY Left 01/10/2021   Procedure: ACHILLES LENGTHENING/KIDNER;  Surgeon: Rosetta Posner, DPM;  Location: [facility];  Service: Podiatry;  Laterality: Left;   AMPUTATION Left 10/03/2021   Procedure: AMPUTATION BELOW KNEE;  Surgeon: Renford Dills, MD;  Location: [facility];  Service: Vascular;  Laterality: Left;   AMPUTATION Right 05/24/2022   Procedure: AMPUTATION BELOW KNEE;  Surgeon: Annice Needy, MD;  Location: [facility];  Service: General;  Laterality: Right;   AMPUTATION TOE Right 02/10/2016   Procedure: AMPUTATION TOE 3RD TOE;  Surgeon: Gwyneth Revels, DPM;  Location: [facility];  Service: Podiatry;  Laterality: Right;   AMPUTATION TOE Left 02/24/2020   Procedure: AMPUTATION TOE;  Surgeon: Rosetta Posner, DPM;  Location: [facility];  Service: Podiatry;  Laterality: Left;   APPLICATION OF WOUND VAC Left 02/29/2020   Procedure: APPLICATION OF WOUND VAC;  Surgeon: Rosetta Posner, DPM;  Location: [facility];  Service: Podiatry;  Laterality: Left;   BIOPSY  03/15/2023   Procedure: BIOPSY;  Surgeon: Wyline Mood, MD;  Location: [facility] ENDOSCOPY;  Service: Gastroenterology;;   CARDIAC CATHETERIZATION     CARDIOVERSION [name] 02/13/2022   Procedure: CARDIOVERSION;  Surgeon: Lamar Blinks, MD;  Location: [facility];  Service: Cardiovascular;  Laterality: [name];   COLONOSCOPY WITH PROPOFOL [name] 11/18/2015   Procedure: COLONOSCOPY WITH PROPOFOL;  Surgeon: Scot Jun, MD;  Location: [facility] ENDOSCOPY;  Service: Endoscopy;  Laterality: [name];   CORONARY ARTERY BYPASS GRAFT     CORONARY STENT INTERVENTION [name] 02/02/2020   Procedure: CORONARY STENT INTERVENTION;  Surgeon: Marcina Millard, MD;  Location: [facility] INVASIVE CV LAB;  Service: Cardiovascular;  Laterality: [name];   ESOPHAGOGASTRODUODENOSCOPY (EGD) WITH PROPOFOL [name] 03/15/2023   Procedure: ESOPHAGOGASTRODUODENOSCOPY (EGD) WITH PROPOFOL;  Surgeon: Wyline Mood, MD;  Location: [facility] ENDOSCOPY;  Service: Gastroenterology;  Laterality: [name];   GASTROSTOMY [name] 03/18/2023   Procedure: INSERTION OF GASTROSTOMY TUBE;  Surgeon: Carolan Shiver, MD;  Location: [facility];  Service: General;  Laterality: [name];   IMPACTION REMOVAL  03/15/2023   Procedure: IMPACTION REMOVAL;  Surgeon: Wyline Mood, MD;  Location: [facility] ENDOSCOPY;  Service: Gastroenterology;;   INCISION AND DRAINAGE Left 08/07/2021   Procedure: INCISION AND DRAINAGE-Partial Calcanectomy;  Surgeon: Rosetta Posner, DPM;  Location: [facility];  Service: Podiatry;  Laterality: Left;   IRRIGATION AND DEBRIDEMENT FOOT Left 02/29/2020   Procedure: IRRIGATION AND DEBRIDEMENT FOOT;  Surgeon: Rosetta Posner, DPM;  Location: [facility];  Service: Podiatry;  Laterality: Left;   IRRIGATION AND DEBRIDEMENT FOOT Left 02/24/2020   Procedure: IRRIGATION AND DEBRIDEMENT FOOT;  Surgeon: Rosetta Posner, DPM;  Location: [facility];  Service: Podiatry;  Laterality: Left;   KNEE ARTHROSCOPY     LEFT HEART CATH AND CORS/GRAFTS ANGIOGRAPHY [name] 02/02/2020   Procedure: LEFT HEART CATH AND CORS/GRAFTS ANGIOGRAPHY;  Surgeon: Dalia Heading, MD;  Location: [facility];  Service: Cardiovascular;  Laterality: [name];   LOWER EXTREMITY ANGIOGRAPHY Left 02/25/2020   Procedure: Lower Extremity Angiography;  Surgeon: Annice Needy, MD;  Location: [facility];  Service: Cardiovascular;  Laterality: Left;   LOWER EXTREMITY ANGIOGRAPHY Left 01/04/2021   Procedure: LOWER EXTREMITY ANGIOGRAPHY;  Surgeon: Annice Needy, MD;  Location: [facility];  Service: Cardiovascular;  Laterality: Left;   LOWER EXTREMITY ANGIOGRAPHY Right 05/21/2022   Procedure: Lower Extremity Angiography;  Surgeon:  Annice Needy, MD;  Location: [facility];  Service: Cardiovascular;  Laterality: Right;   METATARSAL HEAD EXCISION Left 01/10/2021   Procedure: METATARSAL HEAD EXCISION - LEFT 5th;  Surgeon: Rosetta Posner, DPM;  Location: [facility];  Service: Podiatry;  Laterality: Left;   PERIPHERAL VASCULAR CATHETERIZATION Right 01/24/2016   Procedure: Lower Extremity Angiography;  Surgeon: Renford Dills, MD;  Location: [facility];  Service: Cardiovascular;  Laterality: Right;   PERIPHERAL VASCULAR CATHETERIZATION Right 01/25/2016   Procedure: Lower Extremity Angiography;  Surgeon: Renford Dills, MD;  Location: [facility];  Service: Cardiovascular;  Laterality: Right;   PORTACATH PLACEMENT [name] 03/18/2023   Procedure: INSERTION PORT-A-CATH;  Surgeon: Carolan Shiver, MD;  Location: [facility];  Service: General;  Laterality: [name];   TOE AMPUTATION     TONSILLECTOMY       Home Medications:  Prior to Admission medications   Medication Sig Start Date End Date Taking? Authorizing Provider  acetaminophen (TYLENOL) 325 MG tablet Place 2 tablets (650 mg total) into feeding tube every 6 (six) hours as needed for mild pain (pain score  1-3) (or Fever >/= 101). 03/22/23  Yes Lurene Shadow, MD  amiodarone (PACERONE) 200 MG tablet Place 1 tablet (200 mg total) into feeding tube daily. 03/22/23  Yes Lurene Shadow, MD  apixaban (ELIQUIS) 5 MG TABS tablet Place 1 tablet (5 mg total) into feeding tube 2 (two) times daily. 03/22/23  Yes Lurene Shadow, MD  ascorbic acid (VITAMIN C) 500 MG tablet Place 1 tablet (500 mg total) into feeding tube daily. 03/22/23  Yes Lurene Shadow, MD  aspirin (ASPIRIN CHILDRENS) 81 MG chewable tablet Place 1 tablet (81 mg total) into feeding tube daily. 03/22/23  Yes Lurene Shadow, MD  Cholecalciferol 25 MCG (1000 UT) tablet Place 1 tablet (1,000 Units total) into feeding tube daily. Patient taking differently: Place 2,000 Units into feeding tube daily.  03/22/23  Yes Lurene Shadow, MD  clonazePAM (KLONOPIN) 0.5 MG tablet Place 1 tablet (0.5 mg total) into feeding tube 2 (two) times daily. 03/22/23  Yes Lurene Shadow, MD  Cyanocobalamin (VITAMIN B-12) 5000 MCG TBDP Place 10,000 mcg into feeding tube daily. 03/22/23  Yes Lurene Shadow, MD  diltiazem (CARDIZEM) 30 MG tablet Place 2.5 tablets (75 mg total) into feeding tube 4 (four) times daily. 03/21/23  Yes Lurene Shadow, MD  ezetimibe (ZETIA) 10 MG tablet Place 1 tablet (10 mg total) into feeding tube at bedtime. 03/22/23  Yes Lurene Shadow, MD  ferrous sulfate 300 (60 Fe) MG/5ML syrup Place 5 mLs (300 mg total) into feeding tube daily. Patient taking differently: Place 6.8 mLs into feeding tube daily. 03/22/23  Yes Lurene Shadow, MD  finasteride (PROSCAR) 5 MG tablet Take 1 tablet by mouth daily. 02/25/23  Yes [provider]  furosemide (LASIX) 20 MG tablet Take 40 mg by mouth daily. 02/25/23  Yes [provider]  gabapentin (NEURONTIN) 300 MG capsule Place 1 capsule (300 mg total) into feeding tube at bedtime. 03/22/23  Yes Lurene Shadow, MD  HUMALOG 100 UNIT/ML injection Inject 15 Units into the skin 3 (three) times daily with meals. >200 02/05/22  Yes [provider]  Infant Care Products [facility]) OINT Apply 1 Application topically daily as needed.   Yes [provider]  insulin glargine (LANTUS) 100 UNIT/ML injection Inject 0.2 mLs (20 Units total) into the skin 2 (two) times daily. Inject 15 units into the skin daily in AM and inject 25 units into the skin daily in PM Patient taking differently: Inject 50 Units into the skin 2 (two) times daily. 03/22/23  Yes Lurene Shadow, MD  IRON SUPPLEMENT 220 (44 Fe) MG/5ML SOLN Take 5 mLs by mouth daily. 03/23/23  Yes [provider]  isosorbide mononitrate (IMDUR) 60 MG 24 hr tablet Take 60 mg by mouth in the morning and at bedtime.   Yes [provider]  levothyroxine (SYNTHROID) 25 MCG  tablet Place 1 tablet (25 mcg total) into feeding tube daily at 6 (six) AM. 03/22/23  Yes Lurene Shadow, MD  lidocaine-prilocaine (EMLA) cream Apply on the port. 30 -45 min  prior to port access. 03/20/23  Yes [name], MD  losartan (COZAAR) 50 MG tablet Take 1 tablet by mouth daily. 03/04/23  Yes [provider]  Multiple Vitamins-Minerals (MULTIVITAMIN WITH MINERALS) tablet Take 1 tablet by mouth daily.   Yes [provider]  mupirocin ointment (BACTROBAN) 2 % Apply 1 Application topically daily. Patient taking differently: Apply 1 Application topically 3 (three) times daily. On stump 12/13/22  Yes Georgiana Spinner, NP  nitrofurantoin, macrocrystal-monohydrate, (MACROBID) 100 MG capsule Place  1 capsule (100 mg total) into feeding tube daily. 03/22/23  Yes Lurene Shadow, MD  nitroGLYCERIN (NITROSTAT) 0.4 MG SL tablet Place 1 tablet (0.4 mg total) under the tongue every 5 (five) minutes as needed for chest pain. 06/20/22  Yes [name], MD  nystatin (MYCOSTATIN/NYSTOP) powder Apply 1 Application topically 2 (two) times daily. Under belly   Yes [provider]  ondansetron (ZOFRAN) 4 MG tablet Place 1 tablet (4 mg total) into feeding tube every 6 (six) hours as needed for nausea. 03/22/23  Yes Lurene Shadow, MD  pramipexole (MIRAPEX) 1 MG tablet Place 2 tablets (2 mg total) into feeding tube at bedtime. 03/22/23  Yes Lurene Shadow, MD  primidone (MYSOLINE) 250 MG tablet Place 1 tablet (250 mg total) into feeding tube 2 (two) times daily. 03/22/23  Yes Lurene Shadow, MD  tamsulosin (FLOMAX) 0.4 MG CAPS capsule Take 0.4 mg by mouth.   Yes [provider]  Vitamins A & D (VITAMIN A & D) ointment Apply 1 Application topically as needed for dry skin. Apply to stumps   Yes [provider]  zinc sulfate, 50mg  elemental zinc, 220 (50 Zn) MG capsule Place 1 capsule (220 mg total) into feeding tube daily. 03/22/23  Yes Lurene Shadow, MD   Continuous Blood Gluc Sensor (FREESTYLE LIBRE 3 SENSOR) MISC  06/13/22   [provider]  Continuous Glucose Sensor (FREESTYLE LIBRE 3 SENSOR) MISC Use 1 sensor every 14 (fourteen) days. 01/10/23     furosemide (LASIX) 20 MG tablet Take 1 tablet (20 mg total) by mouth ONCE OR TWICE daily (total daily dose maximum 40 mg)  via feeding tube as needed for increased swelling, shortness of breath, weight gain 5+ lbs over 1-2 days. Seek medical care if these symptoms are not improving with increased dose, or if you become dizzy or have low blood pressure. Patient not taking: Reported on 03/28/2023 03/22/23   Lurene Shadow, MD  nutrition supplement, JUVEN, [employer]) PACK Place 1 packet into feeding tube 2 (two) times daily between meals. 03/22/23   Lurene Shadow, MD  Nutritional Supplements (FEEDING SUPPLEMENT, OSMOLITE 1.5 CAL,) LIQD Place 474 mLs into feeding tube 4 (four) times daily -  before meals and at bedtime. 03/22/23   Lurene Shadow, MD    Inpatient Medications: Scheduled Meds:  amiodarone  200 mg Per Tube Daily   apixaban  5 mg Per Tube BID   ascorbic acid  500 mg Per Tube Daily   aspirin  81 mg Per Tube Daily   Chlorhexidine Gluconate Cloth  6 each Topical Daily   cholecalciferol  2,000 Units Per Tube Daily   clonazePAM  0.5 mg Per Tube BID   colestipol  5 g Per Tube Q12H   cyanocobalamin  10,000 mcg Per Tube Daily   diltiazem  90 mg Per Tube QID   ezetimibe  10 mg Per Tube Daily   feeding supplement (GLUCERNA 1.5 CAL)  356 mL Per Tube TID PC & HS   ferrous sulfate  300 mg Per Tube Daily   fiber supplement (BANATROL TF)  60 mL Per Tube BID   fidaxomicin  200 mg Tube BID   free water  230 mL Per Tube TID PC & HS   insulin aspart  0-5 Units Subcutaneous QHS   insulin aspart  0-9 Units Subcutaneous TID WC   insulin glargine-yfgn  15 Units Subcutaneous QHS   levothyroxine  25 mcg Per Tube Q0600   multivitamin with minerals  1 tablet Oral  Daily   pramipexole  2 mg Per Tube QHS    primidone  250 mg Per Tube BID   Continuous Infusions:  PRN Meds: acetaminophen, ipratropium-albuterol, ondansetron  Allergies:    Allergies  Allergen Reactions   Penicillins Other (See Comments)    Happened at 67 years old and pt. stated he passed out  He has tolerated amoxicillin/clavulanate and ampicillin/sulbactam   Statins     Other reaction(s): Muscle Pain Causes legs to ache per pt   Metformin And Related Diarrhea    Social History:   Social History   Socioeconomic History   Marital status: Married    Spouse name: Not on file   Number of children: Not on file   Years of education: Not on file   Highest education level: Not on file  Occupational History   Not on file  Tobacco Use   Smoking status: Former    Current packs/day: 0.00    Average packs/day: 0.5 packs/day for 45.0 years (22.5 ttl pk-yrs)    Types: Cigarettes    Start date: 04/04/1970    Quit date: 04/05/2015    Years since quitting: 8.0   Smokeless tobacco: Never  Vaping Use   Vaping status: Every Day  Substance and Sexual Activity   Alcohol use: Not Currently    Alcohol/week: 3.0 standard drinks of alcohol    Types: 3 Glasses of wine per week   Drug use: No   Sexual activity: Not on file  Other Topics Concern   Not on file  Social History Narrative   Not on file   Social Determinants of Health   Financial Resource Strain: Low Risk  (09/28/2022)   Received from [facility], [facility]   Overall Financial Resource Strain (CARDIA)    Difficulty of Paying Living Expenses: Not hard at all  Food Insecurity: No Food Insecurity (03/29/2023)   Hunger Vital Sign    Worried About Running Out of Food in the Last Year: Never true    Ran Out of Food in the Last Year: Never true  Transportation Needs: No Transportation Needs (03/29/2023)   PRAPARE - Administrator, Civil Service (Medical): No    Lack of Transportation (Non-Medical): No  Physical  Activity: Not on file  Stress: Not on file  Social Connections: Not on file  Intimate Partner Violence: Not At Risk (03/29/2023)   Humiliation, Afraid, Rape, and Kick questionnaire    Fear of Current or Ex-Partner: No    Emotionally Abused: No    Physically Abused: No    Sexually Abused: No    Family History:    Family History  Problem Relation Age of Onset   Hypertension Father    Coronary artery disease Father    Alcohol abuse Father    Heart failure Brother      ROS:  Please see the history of present illness.   All other ROS reviewed and negative.     Physical Exam/Data:   Vitals:   04/04/23 0345 04/04/23 0422 04/04/23 0847 04/04/23 1300  BP:  117/83 113/84 99/77  Pulse:  (!) 122 (!) 123 (!) 121  Resp:  20  18  Temp:  (!) 97.5 F (36.4 C) 98.5 F (36.9 C) 98.1 F (36.7 C)  TempSrc:      SpO2:  92% 90% 93%  Weight: 127.3 kg     Height:        Intake/Output Summary (Last 24 hours) at 04/04/2023 1421  Last data filed at 04/04/2023 0842 Gross per 24 hour  Intake --  Output 2725 ml  Net -2725 ml      04/04/2023    3:45 AM 04/02/2023   10:32 PM 04/02/2023    5:00 AM  Last 3 Weights  Weight (lbs) 280 lb 10.3 oz 281 lb 15.5 oz 285 lb 7.9 oz  Weight (kg) 127.3 kg 127.9 kg 129.5 kg     Body mass index is 46.7 kg/m.  General:  Well nourished, well developed, in no acute distress HEENT: normal Neck: no JVD Vascular: No carotid bruits; Distal pulses 2+ bilaterally Cardiac:  normal S1, S2; tachycardia, RR; no murmur  Lungs:  clear to auscultation bilaterally, no wheezing, rhonchi or rales  Abd: soft, nontender, no hepatomegaly  Ext: no edema Musculoskeletal:  b/l BKA Skin: warm and dry  Neuro:  CNs 2-12 intact, no focal abnormalities noted Psych:  Normal affect   EKG:  The EKG was personally reviewed and demonstrates:  pending Telemetry:  Telemetry was personally reviewed and demonstrates:  aflutter with rates around 120 up to 12/3  Relevant CV  Studies:  Echo 03/2023  1. Left ventricular ejection fraction, by estimation, is 55 %. The left  ventricle has normal function. The left ventricle demonstrates regional  wall motion abnormalities (mild hypokinesis of the basal inferior wall).  There is mild left ventricular  hypertrophy. Left ventricular diastolic parameters are consistent with  Grade I diastolic dysfunction (impaired relaxation). The average left  ventricular global longitudinal strain is -12.0 %.   2. Right ventricular systolic function is normal. The right ventricular  size is normal. Tricuspid regurgitation signal is inadequate for assessing  PA pressure.   3. The mitral valve is normal in structure. No evidence of mitral valve  regurgitation. No evidence of mitral stenosis.   4. The aortic valve is tricuspid. There is mild calcification of the  aortic valve. Aortic valve regurgitation is not visualized. Aortic valve  sclerosis/calcification is present, without any evidence of aortic  stenosis.   5. The inferior vena cava is normal in size with greater than 50%  respiratory variability, suggesting right atrial pressure of 3 mmHg.   Cardiac cath 01/2020  Prox LAD to Mid LAD lesion is 100% stenosed. Mid Cx lesion is 100% stenosed. Mid RCA lesion is 90% stenosed. Origin to Prox Graft lesion is 100% stenosed. A drug-eluting stent was successfully placed using a STENT SYNERGY DES 4X24. Post intervention, there is a 0% residual stenosis.   1.  High-grade 90% stenosis mid RCA 2.  Successful PCI with DES mid RCA   Recommendations   1.  Dual antiplatelet therapy uninterrupted for 1 year    Laboratory Data:  High Sensitivity Troponin:  No results for input(s): "TROPONINIHS" in the last 720 hours.   Chemistry Recent Labs  Lab 04/03/23 0142 04/03/23 1602 04/04/23 0416  NA 134* 134* 134*  K 5.2* 5.4* 5.0  CL 101 102 101  CO2 24 24 24   GLUCOSE 216* 199* 193*  BUN 37* 36* 33*  CREATININE 1.03 1.01 1.05   CALCIUM 8.2* 8.7* 8.6*  MG 2.5*  --   --   GFRNONAA >60 >60 >60  ANIONGAP 9 8 9     No results for input(s): "PROT", "ALBUMIN", "AST", "ALT", "ALKPHOS", "BILITOT" in the last 168 hours. Lipids No results for input(s): "CHOL", "TRIG", "HDL", "LABVLDL", "LDLCALC", "CHOLHDL" in the last 168 hours.  Hematology Recent Labs  Lab 03/31/23 0320 04/03/23 0142 04/04/23 0416  WBC 9.4  10.6* 10.2  RBC 3.87* 3.83* 3.92*  HGB 11.3* 11.2* 11.7*  HCT 34.9* 34.7* 36.0*  MCV 90.2 90.6 91.8  MCH 29.2 29.2 29.8  MCHC 32.4 32.3 32.5  RDW 14.2 14.0 14.1  PLT 256 247 276   Thyroid No results for input(s): "TSH", "FREET4" in the last 168 hours.  BNPNo results for input(s): "BNP", "PROBNP" in the last 168 hours.  DDimer No results for input(s): "DDIMER" in the last 168 hours.   Radiology/Studies:  No results found.   Assessment and Plan:   H/o Afib Rapid aflutter - h/o afib with prior cardioversions.  - admitted with multiple acute issues found to be tachycardia with rates in the 120s - EKG from 12/4 shows likely Aflutter - K on admission 6.7>>>5.0 - Mag 2.5. re-check Mag - TSH normal in October 2024 - echo 03/2023 showed LVEF 55% - remains in aflutter with elevated rates - he is taking amiodarone 200mg  daily and diltiazem 90mg  4 times daily - continue Eliquis 5mg  daily. He is also on ASA due to CAD and PAD - plan to stop oral amiodarone and start IV amiodarone load and drip. Exacerbated rates in the setting of acute issues. If he doesn't self-convert, may need cardioversion.  AKI 2/2 dehydration Acute metabolic encephalopathy>improving Acute diarrhea 2/2 C diff - per IM  Chronic diastolic heart failure - appears euvolemic  CAD - no chest pain reported  Esophageal cancer s/p chemo and Gtube placement  For questions or updates, please contact [facility] Please consult [email or web address] for contact info under    Signed, Susette Seminara David Stall, PA-C  04/04/2023 2:21 PM

## 2023-04-04 NOTE — Plan of Care (Signed)

## 2023-04-05 DIAGNOSIS — N179 Acute kidney failure, unspecified: Secondary | ICD-10-CM | POA: Diagnosis not present

## 2023-04-05 DIAGNOSIS — I4819 Other persistent atrial fibrillation: Secondary | ICD-10-CM

## 2023-04-05 DIAGNOSIS — E875 Hyperkalemia: Secondary | ICD-10-CM | POA: Diagnosis not present

## 2023-04-05 DIAGNOSIS — N2889 Other specified disorders of kidney and ureter: Secondary | ICD-10-CM

## 2023-04-05 DIAGNOSIS — I483 Typical atrial flutter: Secondary | ICD-10-CM

## 2023-04-05 DIAGNOSIS — R410 Disorientation, unspecified: Secondary | ICD-10-CM

## 2023-04-05 DIAGNOSIS — R197 Diarrhea, unspecified: Secondary | ICD-10-CM

## 2023-04-05 DIAGNOSIS — A09 Infectious gastroenteritis and colitis, unspecified: Secondary | ICD-10-CM

## 2023-04-05 LAB — BASIC METABOLIC PANEL
Anion gap: 9 (ref 5–15)
BUN: 33 mg/dL — ABNORMAL HIGH (ref 8–23)
CO2: 25 mmol/L (ref 22–32)
Calcium: 8.6 mg/dL — ABNORMAL LOW (ref 8.9–10.3)
Chloride: 99 mmol/L (ref 98–111)
Creatinine, Ser: 1.08 mg/dL (ref 0.61–1.24)
GFR, Estimated: >60 mL/min (ref >60)
Glucose, Bld: 219 mg/dL — ABNORMAL HIGH (ref 70–99)
Potassium: 4.8 mmol/L (ref 3.5–5.1)
Sodium: 133 mmol/L — ABNORMAL LOW (ref 135–145)

## 2023-04-05 LAB — CBC
HCT: 36.2 % — ABNORMAL LOW (ref 39.0–52.0)
Hemoglobin: 12.1 g/dL — ABNORMAL LOW (ref 13.0–17.0)
MCH: 30.5 pg (ref 26.0–34.0)
MCHC: 33.4 g/dL (ref 30.0–36.0)
MCV: 91.2 fL (ref 80.0–100.0)
Platelets: 292 10*3/uL (ref 150–400)
RBC: 3.97 MIL/uL — ABNORMAL LOW (ref 4.22–5.81)
RDW: 14 % (ref 11.5–15.5)
WBC: 10.8 10*3/uL — ABNORMAL HIGH (ref 4.0–10.5)
nRBC: 0 % (ref 0.0–0.2)

## 2023-04-05 LAB — MRSA NEXT GEN BY PCR, NASAL: MRSA by PCR Next Gen: DETECTED — AB

## 2023-04-05 LAB — MAGNESIUM: Magnesium: 2.2 mg/dL (ref 1.7–2.4)

## 2023-04-05 LAB — GLUCOSE, CAPILLARY
Glucose-Capillary: 210 mg/dL — ABNORMAL HIGH (ref 70–99)
Glucose-Capillary: 195 mg/dL — ABNORMAL HIGH (ref 70–99)
Glucose-Capillary: 232 mg/dL — ABNORMAL HIGH (ref 70–99)
Glucose-Capillary: 183 mg/dL — ABNORMAL HIGH (ref 70–99)
Glucose-Capillary: 213 mg/dL — ABNORMAL HIGH (ref 70–99)

## 2023-04-05 MED ORDER — DIGOXIN 0.25 MG/ML IJ SOLN
0.2500 mg | INTRAMUSCULAR | Status: AC
Start: 2023-04-05 — End: 2023-04-05
  Administered 2023-04-05 (×2): 0.25 mg via INTRAVENOUS
  Filled 2023-04-05 (×2): qty 2

## 2023-04-05 MED ORDER — NUTRISOURCE FIBER PO PACK
1.0000 | PACK | Freq: Two times a day (BID) | ORAL | Status: DC
Start: 2023-04-05 — End: 2023-04-07
  Administered 2023-04-05 – 2023-04-07 (×4): 1
  Filled 2023-04-05 (×4): qty 1

## 2023-04-05 MED ORDER — MUPIROCIN 2 % EX OINT
1.0000 | TOPICAL_OINTMENT | Freq: Two times a day (BID) | CUTANEOUS | Status: AC
  Administered 2023-04-06 – 2023-04-10 (×10): 1 via NASAL
  Filled 2023-04-05: qty 22

## 2023-04-05 MED ORDER — DILTIAZEM HCL 60 MG PO TABS
60.0000 mg | ORAL_TABLET | Freq: Four times a day (QID) | ORAL | Status: DC
  Administered 2023-04-05 – 2023-04-14 (×33): 60 mg via ORAL
  Filled 2023-04-05 (×7): qty 2
  Filled 2023-04-05: qty 1
  Filled 2023-04-05: qty 2
  Filled 2023-04-05: qty 1
  Filled 2023-04-05 (×26): qty 2

## 2023-04-05 MED ORDER — INSULIN GLARGINE-YFGN 100 UNIT/ML ~~LOC~~ SOLN
25.0000 [IU] | Freq: Every day | SUBCUTANEOUS | Status: DC
  Administered 2023-04-05: 25 [IU] via SUBCUTANEOUS
  Filled 2023-04-05 (×2): qty 0.25

## 2023-04-05 NOTE — Progress Notes (Signed)
[facility]  - [place] at [facility]   PATIENT NAME: [name]    MR#:  [id number]  DATE OF BIRTH:  [birth year]  SUBJECTIVE:  Confused, discontinued rectal tube, no complaints  VITALS:  Blood pressure 118/73, pulse (!) 119, temperature (!) 97.5 F (36.4 C), temperature source Oral, resp. rate 16, height 5\' 5"  (1.651 m), weight 126.8 kg, SpO2 93%.  PHYSICAL EXAMINATION:   GENERAL:  66 y.o.-year-old patient with no acute distress. obese, chronically ill LUNGS:decreased breath sounds bilaterally, no wheezing CARDIOVASCULAR: S1, S2 normal. tachycardia ABDOMEN: Soft, nontender, nondistended. PEG+ EXTREMITIES: Bilateral amputation stump  NEUROLOGIC: nonfocal  patient is alert and awake  LABORATORY PANEL:  CBC Recent Labs  Lab 04/05/23 0506  WBC 10.8*  HGB 12.1*  HCT 36.2*  PLT 292    Chemistries  Recent Labs  Lab 04/05/23 0506  NA 133*  K 4.8  CL 99  CO2 25  GLUCOSE 219*  BUN 33*  CREATININE 1.08  CALCIUM 8.6*  MG 2.2   Assessment and Plan  Alec A Bittick Montez Hageman. is a 67 y.o. male with medical history significant of atrial fibrillation, asthma, congestive heart failure, COPD, coronary artery disease, depression, diabetes mellitus, gout, hyperlipidemia, hypertension, hypogonadism in male, morbid obesity, peripheral vascular disease, OSA on CPAP at night, esophageal cancer status post chemo and G-tube placement.  Patient currently being managed for hyperkalemia, severe dehydration and AKI and acute metabolic encephalopathy.   Acute kidney injury secondary to dehydration Severe dehydration Acute metabolic encephalopathy secondary to above Appears resolved --Avoid nephrotoxic medications --cont free water thru PEG   Acute diarrhea secondary to C. difficile infection --Continue on Dificid x 10 days -- have added fiber to tube feeds, today after discussion w/ gi will add banatrol and colestid,  --self-discontinued rectal tube today, no diarrhea  since, will monitor closely   Possible right perinephric abscess --Interventional radiology on board and planning outpatient aspiration as according to them this is less likely an abscess --According to urology Dr. Vanna Scotland she believes that this is chronic from previous trauma/hematoma formation   Severe life-threatening hyperkalemia-improved 5 today after lokelma - continue  - 500 cc bolus    Chronic persistent atrial fibrillation with rvr Tachycardia Cardiology now following On apixaban On dilt and metop currently may need amio infusion   Urinary retention Foley discontinued yesterday, now voiding, bladder scan pending   Chronic diastolic congestive heart failure --Recent EF of 55% on echo obtained on November 2024  COPD --Continue as needed nebulization    Coronary artery disease,  --Continue aspirin therapy   Diabetes mellitus type 2 with hyperglycemia --Continue insulin therapy but will increase basal from 18 to 25  Hypothyroidism --on levothyroxine     Nutrition Status: Nutrition Problem: Moderate Malnutrition Etiology: chronic illness (esopahgeal mass, likely malginant) Signs/Symptoms: mild fat depletion, mild muscle depletion Interventions: Tube feeding  Peripheral vascular disease --Continue aspirin  --Patient is allergic to statin therapy   OSA on CPAP at night,  --CPAP at night   Esophageal cancer status post chemo and G-tube placement. --pt gets Bolus TF      Advance Care Planning:   full code confirmed with wife 12/6  Consults: Interventional radiology, urology  Family Communication: wife updated telephonically 12/6 DVT Prophylaxis :Eliquis Level of care: Progressive Status is: Inpatient Remains inpatient appropriate because: C diff diarrhea persists, encephalopathic, ongoing rvr    Silvano Bilis M.D.   [facility]

## 2023-04-05 NOTE — Progress Notes (Addendum)
2004 hours: Secure chat sent to Manuela Schwartz, NP from this RN: "S: Patient is becoming extremely agitated to the point of threatening staff. B: patient has a known encephalopathy, is here with C diff, AKI. A: Patient is not cooperating with VS or CBGs, won't stay in bed, etc; is disoriented to place, not redirectable; making racist comments to the CNA. R: I have set-up tele sitter but I believe we will need restraints to continue to safely give him routine care."  Edit to the 2004 hrs entry: The specific comment made to the CNA, while she was attempting to measures the patient's SPO2 with a finger clip, was "In the '60s they used to put you all in chains. How would you like to put in chains now?". This statement was made by the patient while wagging his fist in the direction of the CNA and RN.  2006 hrs: Secure chat sent to Manuela Schwartz, NP from this RN: "Secondarily, his MEWS is Yellow for HR and RR but this does not appear to be a new issue".   2048 hours: Reply received via Secure Chat from Manuela Schwartz, NP: "that is kind of odd for him".  2108 hours: Secure chat sent to Manuela Schwartz, NP from this RN: "I can no longer safely keep him in bed. He's belligerent and non-redirectable. Bilat BKA and no prosthetics to even considered any kind of mobility."  2115 hours APP at bedside. RN instructed to administer patient's scheduled clonazepam. No further orders at this time. The patient did receive all of his scheduled meds as ordered.

## 2023-04-05 NOTE — Progress Notes (Signed)
CBG 210. No coverage ordered. Jon Billings, NP notified. No new orders at this time.

## 2023-04-05 NOTE — Plan of Care (Signed)
Patient remains on AR-2A at time of writing. Patient is disoriented to place, time, and situation; non-redirectable. Patient insists that he walk; unable thus far to redirect patient that he has no protheses available in the hospital at this time and is unable to walk otherwise. The patient continues to verbally threaten nursing staff; APP, administrative coordinator, rapid response nurse, and Charge RN have each been updated of this behavior by this RN (see my prior note). The patient is unable to express a clear flow of thoughts. Of note, the patient was not screened for MRSA upon admission, as was indicated (prior history of MRSA), so the MRSA screening PCR orders were initiated by this RN overnight per BPA. The patient's MRSA PCR was found to be positive, so the applicable standing orders were initiated. The patient's MEWS remains yellow due to HR, RR, and LOC (see my prior note). The patient remains incontinent of stool with no fecal management device in place.   Problem: Education: Goal: Ability to describe self-care measures that may prevent or decrease complications (Diabetes Survival Skills Education) will improve Outcome: Not Progressing Goal: Individualized Educational Video(s) Outcome: Not Progressing   Problem: Coping: Goal: Ability to adjust to condition or change in health will improve Outcome: Not Progressing   Problem: Fluid Volume: Goal: Ability to maintain a balanced intake and output will improve Outcome: Not Progressing   Problem: Health Behavior/Discharge Planning: Goal: Ability to identify and utilize available resources and services will improve Outcome: Not Progressing Goal: Ability to manage health-related needs will improve Outcome: Not Progressing   Problem: Metabolic: Goal: Ability to maintain appropriate glucose levels will improve Outcome: Not Progressing   Problem: Nutritional: Goal: Maintenance of adequate nutrition will improve Outcome: Not  Progressing Goal: Progress toward achieving an optimal weight will improve Outcome: Not Progressing   Problem: Skin Integrity: Goal: Risk for impaired skin integrity will decrease Outcome: Not Progressing   Problem: Tissue Perfusion: Goal: Adequacy of tissue perfusion will improve Outcome: Not Progressing   Problem: Education: Goal: Knowledge of General Education information will improve Description: Including pain rating scale, medication(s)/side effects and non-pharmacologic comfort measures Outcome: Not Progressing   Problem: Health Behavior/Discharge Planning: Goal: Ability to manage health-related needs will improve Outcome: Not Progressing   Problem: Clinical Measurements: Goal: Ability to maintain clinical measurements within normal limits will improve Outcome: Not Progressing Goal: Will remain free from infection Outcome: Not Progressing Goal: Diagnostic test results will improve Outcome: Not Progressing Goal: Respiratory complications will improve Outcome: Not Progressing Goal: Cardiovascular complication will be avoided Outcome: Not Progressing   Problem: Activity: Goal: Risk for activity intolerance will decrease Outcome: Not Progressing   Problem: Nutrition: Goal: Adequate nutrition will be maintained Outcome: Not Progressing   Problem: Coping: Goal: Level of anxiety will decrease Outcome: Not Progressing   Problem: Elimination: Goal: Will not experience complications related to bowel motility Outcome: Not Progressing Goal: Will not experience complications related to urinary retention Outcome: Not Progressing   Problem: Pain Management: Goal: General experience of comfort will improve Outcome: Not Progressing   Problem: Safety: Goal: Ability to remain free from injury will improve Outcome: Not Progressing   Problem: Skin Integrity: Goal: Risk for impaired skin integrity will decrease Outcome: Not Progressing

## 2023-04-05 NOTE — Progress Notes (Signed)
Jon Billings, NP notified of patient HR sustaining 118-120 while maxed out of Cardizem gtt. Provider is okay with HR 118-120. No new orders.

## 2023-04-05 NOTE — Progress Notes (Signed)
   04/05/23 1959  Assess: MEWS Score  Temp 98.2 F (36.8 C)  BP (!) 143/78  MAP (mmHg) 96  Pulse Rate (!) 122  ECG Heart Rate (!) 121  Resp (!) 21  SpO2 97 %  O2 Device Room Air  Patient Activity (if Appropriate) In bed  O2 Flow Rate (L/min) 0 L/min  Assess: MEWS Score  MEWS Temp 0  MEWS Systolic 0  MEWS Pulse 2  MEWS RR 1  MEWS LOC 0  MEWS Score 3  MEWS Score Color Yellow  Assess: if the MEWS score is Yellow or Red  Were vital signs accurate and taken at a resting state? Yes  Does the patient meet 2 or more of the SIRS criteria? Yes  Does the patient have a confirmed or suspected source of infection? Yes  MEWS guidelines implemented  Yes, yellow  Treat  MEWS Interventions Considered administering scheduled or prn medications/treatments as ordered  Take Vital Signs  Increase Vital Sign Frequency  Yellow: Q2hr x1, continue Q4hrs until patient remains green for 12hrs  Escalate  MEWS: Escalate Yellow: Discuss with charge nurse and consider notifying provider and/or RRT  Notify: Charge Nurse/RN  Name of Charge Nurse/RN Notified Tiffany, RN  Provider Notification  Provider Name/Title Manuela Schwartz, NP  Date Provider Notified 04/05/23  Time Provider Notified 2006  Method of Notification Page (Secure chat)  Notification Reason Other (Comment) (MEWS)  Provider response At bedside  Date of Provider Response 04/05/23  Time of Provider Response 2115  Notify: Rapid Response  Name of Rapid Response RN Notified Shela Leff, RN  Date Rapid Response Notified 04/05/23  Time Rapid Response Notified 2115  Assess: SIRS CRITERIA  SIRS Temperature  0  SIRS Pulse 1  SIRS Respirations  1  SIRS WBC 0  SIRS Score Sum  2

## 2023-04-05 NOTE — Progress Notes (Signed)
Rounding Note    Patient Name: [name]. Date of Encounter: 04/05/2023  [facility] Cardiologist: Lorine Bears, MD   Subjective   Patient is still in Aflutter with rates of 120. He has some confusion this AM and has been messing with wires in his room. He denies chest pain or SOB.   Inpatient Medications    Scheduled Meds:  apixaban  5 mg Per Tube BID   ascorbic acid  500 mg Per Tube Daily   aspirin  81 mg Per Tube Daily   Chlorhexidine Gluconate Cloth  6 each Topical Daily   cholecalciferol  2,000 Units Per Tube Daily   clonazePAM  0.5 mg Per Tube BID   colestipol  5 g Per Tube Q12H   cyanocobalamin  10,000 mcg Per Tube Daily   ezetimibe  10 mg Per Tube Daily   feeding supplement (GLUCERNA 1.5 CAL)  356 mL Per Tube TID PC & HS   ferrous sulfate  300 mg Per Tube Daily   fiber supplement (BANATROL TF)  60 mL Per Tube BID   fidaxomicin  200 mg Tube BID   free water  230 mL Per Tube TID PC & HS   insulin aspart  0-5 Units Subcutaneous QHS   insulin aspart  0-9 Units Subcutaneous TID WC   insulin glargine-yfgn  18 Units Subcutaneous QHS   levothyroxine  25 mcg Per Tube Q0600   metoprolol tartrate  12.5 mg Per Tube Q6H   multivitamin with minerals  1 tablet Oral Daily   pramipexole  2 mg Per Tube QHS   primidone  250 mg Per Tube BID   Continuous Infusions:  diltiazem (CARDIZEM) infusion 15 mg/hr (04/05/23 0500)   PRN Meds: acetaminophen, ipratropium-albuterol, ondansetron   Vital Signs    Vitals:   04/05/23 0530 04/05/23 0545 04/05/23 0600 04/05/23 0844  BP: 97/62 106/78 103/80 118/73  Pulse: (!) 120 (!) 119 (!) 117 (!) 119  Resp: 16 15 10 16   Temp:    (!) 97.5 F (36.4 C)  TempSrc:    Oral  SpO2: 96% 96% 93% 93%  Weight:      Height:        Intake/Output Summary (Last 24 hours) at 04/05/2023 1001 Last data filed at 04/05/2023 0500 Gross per 24 hour  Intake 127.89 ml  Output 1351 ml  Net -1223.11 ml      04/05/2023    3:20 AM  04/04/2023    3:45 AM 04/02/2023   10:32 PM  Last 3 Weights  Weight (lbs) 279 lb 8.7 oz 280 lb 10.3 oz 281 lb 15.5 oz  Weight (kg) 126.8 kg 127.3 kg 127.9 kg      Telemetry    Aflutter 120s - Personally Reviewed  ECG     No new - Personally Reviewed  Physical Exam   GEN: No acute distress.   Neck: No JVD Cardiac: tachycardia, RR, no murmurs, rubs, or gallops.  Respiratory: Clear to auscultation bilaterally. GI: Soft, nontender, non-distended  MS: No edema; B/L BKA Neuro:  Nonfocal  Psych: Normal affect   Labs    High Sensitivity Troponin:  No results for input(s): "TROPONINIHS" in the last 720 hours.   Chemistry Recent Labs  Lab 04/03/23 0142 04/03/23 1602 04/04/23 0416 04/05/23 0506  NA 134* 134* 134* 133*  K 5.2* 5.4* 5.0 4.8  CL 101 102 101 99  CO2 24 24 24 25   GLUCOSE 216* 199* 193* 219*  BUN 37* 36*  33* 33*  CREATININE 1.03 1.01 1.05 1.08  CALCIUM 8.2* 8.7* 8.6* 8.6*  MG 2.5*  --   --   --   GFRNONAA >60 >60 >60 >60  ANIONGAP 9 8 9 9     Lipids No results for input(s): "CHOL", "TRIG", "HDL", "LABVLDL", "LDLCALC", "CHOLHDL" in the last 168 hours.  Hematology Recent Labs  Lab 04/03/23 0142 04/04/23 0416 04/05/23 0506  WBC 10.6* 10.2 10.8*  RBC 3.83* 3.92* 3.97*  HGB 11.2* 11.7* 12.1*  HCT 34.7* 36.0* 36.2*  MCV 90.6 91.8 91.2  MCH 29.2 29.8 30.5  MCHC 32.3 32.5 33.4  RDW 14.0 14.1 14.0  PLT 247 276 292   Thyroid No results for input(s): "TSH", "FREET4" in the last 168 hours.  BNPNo results for input(s): "BNP", "PROBNP" in the last 168 hours.  DDimer No results for input(s): "DDIMER" in the last 168 hours.   Radiology    No results found.  Cardiac Studies   Echo 03/2023  1. Left ventricular ejection fraction, by estimation, is 55 %. The left  ventricle has normal function. The left ventricle demonstrates regional  wall motion abnormalities (mild hypokinesis of the basal inferior wall).  There is mild left ventricular  hypertrophy. Left  ventricular diastolic parameters are consistent with  Grade I diastolic dysfunction (impaired relaxation). The average left  ventricular global longitudinal strain is -12.0 %.   2. Right ventricular systolic function is normal. The right ventricular  size is normal. Tricuspid regurgitation signal is inadequate for assessing  PA pressure.   3. The mitral valve is normal in structure. No evidence of mitral valve  regurgitation. No evidence of mitral stenosis.   4. The aortic valve is tricuspid. There is mild calcification of the  aortic valve. Aortic valve regurgitation is not visualized. Aortic valve  sclerosis/calcification is present, without any evidence of aortic  stenosis.   5. The inferior vena cava is normal in size with greater than 50%  respiratory variability, suggesting right atrial pressure of 3 mmHg.    Cardiac cath 01/2020  Prox LAD to Mid LAD lesion is 100% stenosed. Mid Cx lesion is 100% stenosed. Mid RCA lesion is 90% stenosed. Origin to Prox Graft lesion is 100% stenosed. A drug-eluting stent was successfully placed using a STENT SYNERGY DES 4X24. Post intervention, there is a 0% residual stenosis.   1.  High-grade 90% stenosis mid RCA 2.  Successful PCI with DES mid RCA   Recommendations   1.  Dual antiplatelet therapy uninterrupted for 1 year  Patient Profile     67 y.o. male with a hx of frequent falls, PAD s/p b/l BKA, DM2, afib RVR with prior cardioversion, moderate LVH, CAD with CABG in 2006, HTN, esophageal cancer s/p chemo and G-tube placement, and CKD who is being seen 04/04/2023 for the evaluation of aflutter RVR   Assessment & Plan    H/o Afib Rapid aflutter - h/o afib with prior cardioversions.  - admitted with multiple acute issues found to be tachycardia with rates in the 120s - EKG from 12/4 shows likely Aflutter. Unclear when he went into aflutter - K 4.8 - Mag 2.5. re-check Mag - TSH normal in October 2024 - echo 03/2023 showed LVEF 55% -  remains in aflutter with elevated rates - continue Eliquis 5mg  daily. He is also on ASA due to CAD and PAD - amiodarone stopped due to possible conversion to NSR with missed Eliquis doses during the last hospitalization - he was started on IV dilt,  but pulled out IV  - plan to give dig 0.25mg  x 2 and start oral dilt 60 Q6 hours - continue metoprolol 12.5mg  Q6H - he is not a candidate for TEE/DCCV due to obstructing esophageal tumor. Continue to monitor tele   AKI 2/2 dehydration Acute metabolic encephalopathy>improving Acute diarrhea 2/2 C diff - per IM   Chronic diastolic heart failure - appears euvolemic   CAD - no chest pain reported   Esophageal cancer s/p chemo and Gtube placement  For questions or updates, please contact [facility] Please consult [email or web address] for contact info under        Signed, Holley Wirt David Stall, PA-C  04/05/2023, 10:01 AM

## 2023-04-05 NOTE — Progress Notes (Addendum)
Nutrition Follow-Up Note  Spoke with pt experience ([name]) regarding pt and wife. Pt wife requesting education on TF prior to discharge.   Spoke with pt at bedside, who has some confusion. Pt denies any nausea, vomiting, or diarrhea. He is unsure if rectal tube out has improved.   Called and spoke with pt wife., She reports that she will not be in the hospital due to recent medical procedure. Reviewed home TF regimen with her. Also discussed with her that RD spoke with nurse to help with TF education (also put in care order). RD also let her know that RD at [facility] will also be an added layer of support at discharge.   Case discussed with MD.   Medications reviewed and include vitamin C, vitamin D3, vitamin B-12, cardizem, ferrous sulfate, and MVI.   Labs reviewed: CBGS: 157-232 (inpatient orders for glycemic control are 0-5 units insulin aspart daily at bedtime, 0-9 units insulin aspart TID with meals, and 18 units insulin glargine-yfgn daily).    INTERVENTION:    -TF via g-tube:    356 ml Glucerna 1.5 4 times daily   115 ml free water flush before and after each feeding administration   Tube feeding regimen provides 2136 kcal (100% of needs), 118 grams of protein, and 1080 ml of H2O.  Total free water: 2000 ml daily   -Allow ice chips for dry mouth per discussion with MD   -D/c 1 packet Banatrol BID (due to low supply- pharmacy to order more) -Nutrisource Fiber BID  Levada Schilling, RD, LDN, CDCES Registered Dietitian III Certified Diabetes Care and Education Specialist Please refer to [facility] for RD and/or RD on-call/weekend/after hours pager

## 2023-04-06 DIAGNOSIS — I4892 Unspecified atrial flutter: Secondary | ICD-10-CM | POA: Diagnosis not present

## 2023-04-06 DIAGNOSIS — G9341 Metabolic encephalopathy: Secondary | ICD-10-CM

## 2023-04-06 DIAGNOSIS — I480 Paroxysmal atrial fibrillation: Secondary | ICD-10-CM | POA: Diagnosis not present

## 2023-04-06 DIAGNOSIS — E875 Hyperkalemia: Secondary | ICD-10-CM | POA: Diagnosis not present

## 2023-04-06 LAB — CBC
HCT: 35.8 % — ABNORMAL LOW (ref 39.0–52.0)
Hemoglobin: 12 g/dL — ABNORMAL LOW (ref 13.0–17.0)
MCH: 29.8 pg (ref 26.0–34.0)
MCHC: 33.5 g/dL (ref 30.0–36.0)
MCV: 88.8 fL (ref 80.0–100.0)
Platelets: 296 10*3/uL (ref 150–400)
RBC: 4.03 MIL/uL — ABNORMAL LOW (ref 4.22–5.81)
RDW: 14 % (ref 11.5–15.5)
WBC: 11.9 10*3/uL — ABNORMAL HIGH (ref 4.0–10.5)
nRBC: 0 % (ref 0.0–0.2)

## 2023-04-06 LAB — URINALYSIS, COMPLETE (UACMP) WITH MICROSCOPIC
Bilirubin Urine: NEGATIVE
Glucose, UA: NEGATIVE mg/dL
Hgb urine dipstick: NEGATIVE
Ketones, ur: NEGATIVE mg/dL
Nitrite: NEGATIVE
Protein, ur: 100 mg/dL — AB
Specific Gravity, Urine: 1.011 (ref 1.005–1.030)
Squamous Epithelial / HPF: 0 /[HPF] (ref 0–5)
WBC, UA: >50 WBC/hpf (ref 0–5)
pH: 6 (ref 5.0–8.0)

## 2023-04-06 LAB — GLUCOSE, CAPILLARY
Glucose-Capillary: 164 mg/dL — ABNORMAL HIGH (ref 70–99)
Glucose-Capillary: 170 mg/dL — ABNORMAL HIGH (ref 70–99)
Glucose-Capillary: 173 mg/dL — ABNORMAL HIGH (ref 70–99)
Glucose-Capillary: 180 mg/dL — ABNORMAL HIGH (ref 70–99)
Glucose-Capillary: 185 mg/dL — ABNORMAL HIGH (ref 70–99)
Glucose-Capillary: 190 mg/dL — ABNORMAL HIGH (ref 70–99)
Glucose-Capillary: 220 mg/dL — ABNORMAL HIGH (ref 70–99)

## 2023-04-06 LAB — BASIC METABOLIC PANEL
Anion gap: 7 (ref 5–15)
BUN: 28 mg/dL — ABNORMAL HIGH (ref 8–23)
CO2: 26 mmol/L (ref 22–32)
Calcium: 8.8 mg/dL — ABNORMAL LOW (ref 8.9–10.3)
Chloride: 101 mmol/L (ref 98–111)
Creatinine, Ser: 1.1 mg/dL (ref 0.61–1.24)
GFR, Estimated: >60 mL/min (ref >60)
Glucose, Bld: 169 mg/dL — ABNORMAL HIGH (ref 70–99)
Potassium: 5.2 mmol/L — ABNORMAL HIGH (ref 3.5–5.1)
Sodium: 134 mmol/L — ABNORMAL LOW (ref 135–145)

## 2023-04-06 MED ORDER — HALOPERIDOL 5 MG PO TABS
5.0000 mg | ORAL_TABLET | Freq: Four times a day (QID) | ORAL | Status: DC | PRN
  Administered 2023-04-06: 5 mg
  Filled 2023-04-06 (×2): qty 1

## 2023-04-06 MED ORDER — METOPROLOL TARTRATE 25 MG PO TABS
25.0000 mg | ORAL_TABLET | Freq: Four times a day (QID) | ORAL | Status: DC
  Administered 2023-04-06 – 2023-04-14 (×32): 25 mg
  Filled 2023-04-06 (×33): qty 1

## 2023-04-06 MED ORDER — INSULIN GLARGINE-YFGN 100 UNIT/ML ~~LOC~~ SOLN
28.0000 [IU] | Freq: Every day | SUBCUTANEOUS | Status: DC
  Administered 2023-04-06: 28 [IU] via SUBCUTANEOUS
  Filled 2023-04-06: qty 0.28

## 2023-04-06 MED ORDER — ORAL CARE MOUTH RINSE: 15.0000 mL | OROMUCOSAL | Status: DC | PRN

## 2023-04-06 MED ORDER — SODIUM ZIRCONIUM CYCLOSILICATE 10 G PO PACK
10.0000 g | PACK | Freq: Once | ORAL | Status: AC
  Administered 2023-04-06: 10 g
  Filled 2023-04-06: qty 1

## 2023-04-06 MED ORDER — HALOPERIDOL 2 MG PO TABS
2.0000 mg | ORAL_TABLET | Freq: Four times a day (QID) | ORAL | Status: DC | PRN
  Administered 2023-04-08: 2 mg
  Filled 2023-04-06 (×2): qty 1

## 2023-04-06 NOTE — Progress Notes (Addendum)
[facility]  - [place] at [facility]   PATIENT NAME: [name]    MR#:  [id number]  DATE OF BIRTH:  01-03-1956  SUBJECTIVE:  Sleeping in bed  VITALS:  Blood pressure 118/69, pulse (!) 125, temperature 98.6 F (37 C), temperature source Oral, resp. rate 20, height 5\' 5"  (1.651 m), weight 123.6 kg, SpO2 97%.  PHYSICAL EXAMINATION:   GENERAL:  67 y.o.-year-old patient with no acute distress. obese, chronically ill LUNGS:decreased breath sounds bilaterally, no wheezing CARDIOVASCULAR: S1, S2 normal. tachycardia ABDOMEN: Soft, nontender, nondistended. PEG+ EXTREMITIES: Bilateral amputation stump  NEUROLOGIC: moves all 4  LABORATORY PANEL:  CBC Recent Labs  Lab 04/06/23 0649  WBC 11.9*  HGB 12.0*  HCT 35.8*  PLT 296    Chemistries  Recent Labs  Lab 04/05/23 0506 04/06/23 0649  NA 133* 134*  K 4.8 5.2*  CL 99 101  CO2 25 26  GLUCOSE 219* 169*  BUN 33* 28*  CREATININE 1.08 1.10  CALCIUM 8.6* 8.8*  MG 2.2  --    Assessment and Plan  Alec A Jahnke Montez Hageman. is a 67 y.o. male with medical history significant of atrial fibrillation, asthma, congestive heart failure, COPD, coronary artery disease, depression, diabetes mellitus, gout, hyperlipidemia, hypertension, hypogonadism in male, morbid obesity, peripheral vascular disease, OSA on CPAP at night, esophageal cancer status post chemo and G-tube placement.  Patient currently being managed for hyperkalemia, severe dehydration and AKI and acute metabolic encephalopathy.   Acute kidney injury secondary to dehydration Severe dehydration Appears resolved --Avoid nephrotoxic medications --cont free water thru PEG  Delirium Intermittent, delirious this morning, haldol has helped - haldol prn   Acute diarrhea secondary to C. difficile infection --Continue on Dificid   -- have added fiber to tube feeds, also added banatrol and colestid, banatrol won't be available until monday --self-discontinued  rectal tube yesterday, stooled overnight but unclear what consistency, no stool yet today for nursing   Possible right perinephric abscess --Interventional radiology on board and planning outpatient aspiration as according to them this is less likely an abscess --According to urology Dr. Vanna Scotland she believes that this is chronic from previous trauma/hematoma formation   Severe life-threatening hyperkalemia-improved Mild elevation today - dose lokelma    Chronic persistent atrial fibrillation with rvr Tachycardia Cardiology now following On apixaban On dilt and metop currently may need amio infusion despite anticoag interruption.    Urinary retention Foley discontinued , now voiding, bladder scan neg, thus appears to be resolved   Chronic diastolic congestive heart failure --Recent EF of 55% on echo obtained on November 2024  COPD --Continue as needed nebulization    Coronary artery disease,  --Continue aspirin therapy   Diabetes mellitus type 2 with hyperglycemia --Continue insulin therapy but will increase basal from 25 to 28  Hypothyroidism --on levothyroxine     Nutrition Status: Nutrition Problem: Moderate Malnutrition Etiology: chronic illness (esopahgeal mass, likely malginant) Signs/Symptoms: mild fat depletion, mild muscle depletion Interventions: Tube feeding  Peripheral vascular disease --Continue aspirin  --Patient is allergic to statin therapy   OSA on CPAP at night,  --CPAP at night   Esophageal cancer status post chemo and G-tube placement. --pt gets Bolus TF      Advance Care Planning:   full code confirmed with wife 12/6  Consults: Interventional radiology, urology  Family Communication: wife updated telephonically 12/7 DVT Prophylaxis :Eliquis Level of care: Progressive Status is: Inpatient Remains inpatient appropriate because: C diff diarrhea persists, encephalopathic, ongoing rvr  Silvano Bilis M.D.   [facility]

## 2023-04-06 NOTE — Progress Notes (Signed)
Rounding Note    Patient Name: [name]. Date of Encounter: 04/06/2023  [facility] Cardiologist: Lorine Bears, MD   Subjective   On arrival, patient completely covered by blanket, was somewhat resistant to me uncovering him. He denies chest pain or shortness of breath. Cannot feel his heart rate.   Inpatient Medications    Scheduled Meds:  apixaban  5 mg Per Tube BID   ascorbic acid  500 mg Per Tube Daily   aspirin  81 mg Per Tube Daily   Chlorhexidine Gluconate Cloth  6 each Topical Daily   cholecalciferol  2,000 Units Per Tube Daily   clonazePAM  0.5 mg Per Tube BID   colestipol  5 g Per Tube Q12H   cyanocobalamin  10,000 mcg Per Tube Daily   diltiazem  60 mg Oral Q6H   ezetimibe  10 mg Per Tube Daily   feeding supplement (GLUCERNA 1.5 CAL)  356 mL Per Tube TID PC & HS   ferrous sulfate  300 mg Per Tube Daily   fiber  1 packet Per Tube BID   fidaxomicin  200 mg Tube BID   free water  230 mL Per Tube TID PC & HS   insulin aspart  0-5 Units Subcutaneous QHS   insulin aspart  0-9 Units Subcutaneous TID WC   insulin glargine-yfgn  25 Units Subcutaneous QHS   levothyroxine  25 mcg Per Tube Q0600   metoprolol tartrate  25 mg Per Tube Q6H   multivitamin with minerals  1 tablet Oral Daily   mupirocin ointment  1 Application Nasal BID   pramipexole  2 mg Per Tube QHS   primidone  250 mg Per Tube BID   Continuous Infusions:  PRN Meds: acetaminophen, haloperidol, ipratropium-albuterol, ondansetron, mouth rinse   Vital Signs    Vitals:   04/06/23 0300 04/06/23 0400 04/06/23 0407 04/06/23 0803  BP:   107/88 131/85  Pulse:   (!) 125 (!) 128  Resp: (!) 22 19 20    Temp:   98.6 F (37 C)   TempSrc:   Oral   SpO2:   100%   Weight:  123.6 kg    Height:        Intake/Output Summary (Last 24 hours) at 04/06/2023 1158 Last data filed at 04/06/2023 0500 Gross per 24 hour  Intake 7296.13 ml  Output 0 ml  Net 7296.13 ml      04/06/2023    4:00  AM 04/05/2023    3:20 AM 04/04/2023    3:45 AM  Last 3 Weights  Weight (lbs) 272 lb 7.8 oz 279 lb 8.7 oz 280 lb 10.3 oz  Weight (kg) 123.6 kg 126.8 kg 127.3 kg      Telemetry    Atrial flutter with rates 120s - Personally Reviewed  Physical Exam   GEN: No acute distress.   Neck: No JVD Cardiac: tachycardic, RRR, no murmurs, rubs, or gallops.  Respiratory: Clear to auscultation bilaterally. GI: Soft, nontender, non-distended, PEG tube in place MS: b/l BKA Neuro:  Nonfocal   New pertinent results (labs, ECG, imaging, cardiac studies)     Patient Profile     67 y.o. male with a hx of frequent falls, PAD s/p b/l BKA, DM2, afib RVR with prior cardioversion, moderate LVH, CAD with CABG in 2006, HTN, esophageal cancer s/p chemo and G-tube placement, and CKD who is being seen for aflutter RVR   Assessment & Plan    Atrial flutter with RVR  Paroxysmal atrial fibrillation -CHA2DS2/VAS Stroke Risk Points=5 -had window without anticoagulation, now back on apixaban -not a candidate for TEE given esophageal cancer -attempting to rate control with g tube meds as he pulled out lines earlier this admission, however rates remain elevated -currently on diltiazem 60 mg q6 hours and metoprolol 12.5 mg q6 hours. Will increase metoprolol to 25 mg q 6 hours today, will continue to titrate as much as BP allows, but if cannot get rate control with diltiazem and metoprolol, may need to use amiodarone despite window of missed anticoagulation -received IV digoxin x2 yesterday without significant improvement  Acute metabolic encephalopathy Esophageal cancer s/p g tube C diff -per primary team  CAD s/p CABG 2006, PAD s/p BKA -on aspirin and DOAC -on ezetimibe while inpatient    Signed, Jodelle Red, MD  04/06/2023, 11:58 AM

## 2023-04-07 DIAGNOSIS — A09 Infectious gastroenteritis and colitis, unspecified: Secondary | ICD-10-CM | POA: Diagnosis not present

## 2023-04-07 DIAGNOSIS — R197 Diarrhea, unspecified: Secondary | ICD-10-CM | POA: Diagnosis not present

## 2023-04-07 DIAGNOSIS — N179 Acute kidney failure, unspecified: Secondary | ICD-10-CM | POA: Diagnosis not present

## 2023-04-07 DIAGNOSIS — Z515 Encounter for palliative care: Secondary | ICD-10-CM | POA: Diagnosis not present

## 2023-04-07 DIAGNOSIS — I4892 Unspecified atrial flutter: Secondary | ICD-10-CM | POA: Insufficient documentation

## 2023-04-07 DIAGNOSIS — E875 Hyperkalemia: Secondary | ICD-10-CM | POA: Diagnosis not present

## 2023-04-07 LAB — GLUCOSE, CAPILLARY
Glucose-Capillary: 159 mg/dL — ABNORMAL HIGH (ref 70–99)
Glucose-Capillary: 161 mg/dL — ABNORMAL HIGH (ref 70–99)
Glucose-Capillary: 167 mg/dL — ABNORMAL HIGH (ref 70–99)
Glucose-Capillary: 191 mg/dL — ABNORMAL HIGH (ref 70–99)
Glucose-Capillary: 195 mg/dL — ABNORMAL HIGH (ref 70–99)
Glucose-Capillary: 198 mg/dL — ABNORMAL HIGH (ref 70–99)

## 2023-04-07 LAB — BASIC METABOLIC PANEL
Anion gap: 10 (ref 5–15)
BUN: 32 mg/dL — ABNORMAL HIGH (ref 8–23)
CO2: 23 mmol/L (ref 22–32)
Calcium: 8.8 mg/dL — ABNORMAL LOW (ref 8.9–10.3)
Chloride: 99 mmol/L (ref 98–111)
Creatinine, Ser: 1.14 mg/dL (ref 0.61–1.24)
GFR, Estimated: >60 mL/min (ref >60)
Glucose, Bld: 171 mg/dL — ABNORMAL HIGH (ref 70–99)
Potassium: 5.3 mmol/L — ABNORMAL HIGH (ref 3.5–5.1)
Sodium: 132 mmol/L — ABNORMAL LOW (ref 135–145)

## 2023-04-07 LAB — CBC
HCT: 37.1 % — ABNORMAL LOW (ref 39.0–52.0)
Hemoglobin: 12.2 g/dL — ABNORMAL LOW (ref 13.0–17.0)
MCH: 29.3 pg (ref 26.0–34.0)
MCHC: 32.9 g/dL (ref 30.0–36.0)
MCV: 89 fL (ref 80.0–100.0)
Platelets: 351 10*3/uL (ref 150–400)
RBC: 4.17 MIL/uL — ABNORMAL LOW (ref 4.22–5.81)
RDW: 14.2 % (ref 11.5–15.5)
WBC: 12.6 10*3/uL — ABNORMAL HIGH (ref 4.0–10.5)
nRBC: 0 % (ref 0.0–0.2)

## 2023-04-07 MED ORDER — COLESTIPOL HCL 5 G PO PACK
5.0000 g | PACK | Freq: Three times a day (TID) | ORAL | Status: DC
  Filled 2023-04-07: qty 1

## 2023-04-07 MED ORDER — AMIODARONE HCL IN DEXTROSE 360-4.14 MG/200ML-% IV SOLN
30.0000 mg/h | INTRAVENOUS | Status: DC
  Administered 2023-04-07 – 2023-04-08 (×3): 30 mg/h via INTRAVENOUS
  Administered 2023-04-08 – 2023-04-09 (×3): 60 mg/h via INTRAVENOUS
  Filled 2023-04-07 (×6): qty 200

## 2023-04-07 MED ORDER — FREE WATER
250.0000 mL | Freq: Three times a day (TID) | Status: DC
  Administered 2023-04-07 – 2023-04-09 (×8): 250 mL

## 2023-04-07 MED ORDER — VANCOMYCIN 50 MG/ML ORAL SOLUTION
500.0000 mg | Freq: Two times a day (BID) | ORAL | Status: AC
  Administered 2023-04-07 – 2023-04-16 (×19): 500 mg
  Filled 2023-04-07 (×19): qty 10

## 2023-04-07 MED ORDER — VANCOMYCIN HCL 250 MG PO CAPS
500.0000 mg | ORAL_CAPSULE | Freq: Two times a day (BID) | ORAL | Status: DC
  Administered 2023-04-07: 500 mg via ORAL
  Filled 2023-04-07 (×2): qty 2

## 2023-04-07 MED ORDER — SODIUM ZIRCONIUM CYCLOSILICATE 10 G PO PACK
10.0000 g | PACK | Freq: Two times a day (BID) | ORAL | Status: AC
  Administered 2023-04-07 (×2): 10 g via ORAL
  Filled 2023-04-07 (×2): qty 1

## 2023-04-07 MED ORDER — AMIODARONE LOAD VIA INFUSION
150.0000 mg | Freq: Once | INTRAVENOUS | Status: AC
  Administered 2023-04-07: 150 mg via INTRAVENOUS
  Filled 2023-04-07: qty 83.34

## 2023-04-07 MED ORDER — INSULIN GLARGINE-YFGN 100 UNIT/ML ~~LOC~~ SOLN
30.0000 [IU] | Freq: Every day | SUBCUTANEOUS | Status: DC
  Administered 2023-04-08: 30 [IU] via SUBCUTANEOUS
  Filled 2023-04-07 (×3): qty 0.3

## 2023-04-07 MED ORDER — AMIODARONE HCL IN DEXTROSE 360-4.14 MG/200ML-% IV SOLN
60.0000 mg/h | INTRAVENOUS | Status: AC
  Administered 2023-04-07 (×2): 60 mg/h via INTRAVENOUS
  Filled 2023-04-07 (×2): qty 200

## 2023-04-07 NOTE — Consult Note (Cosign Needed Addendum)
GI Inpatient Consult Note  Reason for Consult: Diarrhea    Attending Requesting Consult: Dr. Shonna Chock  History of Present Illness: [name]. is a 66 y.o. male seen for evaluation of diarrhea at the request of hospitalist - Dr. Shonna Chock. Patient has a PMH of morbid obesity (BMI 45), HTN, HLD, T2DM, depression, PVD, OSA on CPAP, CAD, CHF, PAF on chronic anticoagulation, and recently diagnosed esophageal cancer s/p chemo and gastrostomy tube placement. He was admitted to [facility] 11/28 for chief complaint of generalized weakness and diarrhea. He is being managed for hyperkalemia, severe dehydration, AKI, and acute metabolic encephalopathy. He was found to have C diff infection and was started on Dificid 200 mg twice daily, today is Day 10. GI formally consulted this morning for chief complaint of persistent diarrhea despite use of Dificid. He continues to have profuse, watery bowel movements. Patient seen and examined this morning resting comfortably in hospital bed. He reports three bowel movements in his bed this morning since waking up. Per nursing report, stools have color, odor, and consistency of glucerna. Stools have been brown and fully liquid. He has had fiber supplements added. He had banatrol added as well as colestid with persistent loose stools daily. No overt hematochezia or melena. He denies any associated abdominal pain, abdominal cramping, fevers, chills, nausea, or vomiting. He is not taking anything orally due to his esophageal cancer. He has continued to receive the tube feeds and has been tolerating these well. Reportedly was delirious yesterday, but this has resolved. Patient able to answer all questions.    Summary of GI Procedures:  EGD 03/15/2023 - food found in lower third of esophagus with removal, malignant-appearing stricture found at GEJ, large, ulcerating mass with bleeding and stigmata of recent bleeding found 40 cm from incisors completley obstructing  CSY  11/18/2015 - left-sided diverticulosis, nine subcentimeter polyps removed with path showing TA x1, SSA x1, and hyperplastic polyps   Past Medical History:  Past Medical History:  Diagnosis Date   Arrhythmia    atrial fibrillation   Asthma    CHF (congestive heart failure) (HCC)    COPD (chronic obstructive pulmonary disease) (HCC)    Coronary artery disease    Depression    Diabetes mellitus without complication (HCC)    Gout    History anabolic steroid use    Hyperlipidemia    Hypertension    Hypogonadism in male    MI (myocardial infarction) (HCC)    Morbid obesity (HCC)    Myocardial infarction (HCC)    Peripheral vascular disease (HCC)    Perirectal abscess    Pleurisy    Sleep apnea    CPAP at night, no oxygen   Varicella     Problem List: Patient Active Problem List   Diagnosis Date Noted   Diarrhea 04/05/2023   Perinephric fluid collection 04/05/2023   Persistent atrial fibrillation (HCC) 04/05/2023   Typical atrial flutter (HCC) 04/05/2023   Diarrhea of infectious origin 04/05/2023   Acute hyperkalemia 03/28/2023   Esophageal adenocarcinoma (HCC) 03/15/2023   Dysphagia 03/14/2023   Coronary artery disease 03/14/2023   Type 2 diabetes mellitus with peripheral neuropathy (HCC) 03/14/2023   Status post below-knee amputation of right lower extremity (HCC) 05/31/2022   S/P amputation 05/30/2022   Poorly controlled diabetes mellitus (HCC) 05/30/2022   Acute osteomyelitis of right foot (HCC) 05/19/2022   Wound infection 05/18/2022   Ulcer of right ankle, with fat layer exposed (HCC) 05/18/2022   Non-healing  wound of lower extremity, initial encounter 05/18/2022   CAD with Hx of CABG 05/18/2022   Chronic respiratory failure with hypoxia (HCC) 05/18/2022   S/P BKA (below knee amputation), left (HCC) 05/18/2022   COPD (chronic obstructive pulmonary disease) (HCC) 05/18/2022   Perinephric hematoma 03/20/2022   Bacteremia due to Klebsiella pneumoniae 03/19/2022    Hyperglycemia 03/16/2022   Septic shock (HCC) 03/16/2022   Sepsis secondary to UTI (HCC) 03/15/2022   Acute hypoxemic respiratory failure (HCC) 02/13/2022   Atrial fibrillation with rapid ventricular response (HCC) 02/11/2022   Uncontrolled type 2 diabetes mellitus with hyperglycemia, with long-term current use of insulin (HCC) 02/11/2022   Dyslipidemia 02/11/2022   Hypothyroidism 02/11/2022   Benign prostatic hyperplasia with lower urinary tract symptoms 02/11/2022   Pressure injury of skin of heel 11/04/2021   UTI (urinary tract infection) 10/22/2021   Iron deficiency anemia 10/19/2021   Hyperkalemia 10/14/2021   Acute urinary retention 10/08/2021   Sepsis due to methicillin resistant Staphylococcus aureus (MRSA) (HCC) 10/04/2021   Delirium 10/04/2021   Acute on chronic respiratory failure with hypoxia and hypercapnia (HCC) 10/04/2021   Acute respiratory failure with hypoxia and hypercapnia (HCC) 10/03/2021   OSA (obstructive sleep apnea) 10/03/2021   Malnutrition of moderate degree 09/29/2021   Generalized weakness 09/29/2021   Cellulitis of left foot 08/04/2021   Uncontrolled type 2 diabetes mellitus with hypoglycemia, with long-term current use of insulin (HCC) 08/04/2021   Bilateral lower extremity edema 08/04/2021   Acute osteomyelitis of lower leg, left (HCC) 08/04/2021   Atrial fibrillation, chronic (HCC) 08/04/2021   Swelling of limb 07/18/2021   CHF exacerbation (HCC) 05/08/2021   Diabetes mellitus without complication (HCC)    Atrial fibrillation with RVR (HCC)    Obesity, Class III, BMI 40-49.9 (morbid obesity) (HCC)    Venous stasis ulcer (HCC)    Sinus tachycardia    BPH (benign prostatic hyperplasia)    Chronic anticoagulation    Anasarca    Chronic diastolic CHF (congestive heart failure) (HCC)    Venous stasis of both lower extremities    Paroxysmal atrial fibrillation (HCC)    Lower extremity cellulitis 01/03/2021   Chronic osteomyelitis of left foot (HCC)  01/03/2021   Osteomyelitis (HCC) 01/03/2021   Severe sepsis (HCC) 02/24/2020   Acute kidney injury (HCC) 02/24/2020   Bilateral cellulitis of lower leg 02/23/2020   CAD (coronary artery disease) 02/02/2020   RLS (restless legs syndrome) 05/15/2019   Statin myopathy 05/15/2019   Type 2 diabetes mellitus with obesity (HCC) 02/13/2016   Essential hypertension 02/13/2016   PAD (peripheral artery disease) (HCC) 02/13/2016   Pressure injury of skin 01/25/2016   Hypoglycemia associated with diabetes (HCC) 12/27/2013   Long-term insulin use (HCC) 12/27/2013   Microalbuminuria 12/27/2013   B12 deficiency 09/29/2013   Obesity 09/29/2013   CAD (coronary artery disease), autologous vein bypass graft 07/20/2013   Hypersomnia with sleep apnea 07/20/2013   Pure hypercholesterolemia 07/20/2013   Osteomyelitis of ankle or foot 07/20/2011    Past Surgical History: Past Surgical History:  Procedure Laterality Date   ABDOMINAL AORTIC ANEURYSM REPAIR     ACHILLES TENDON SURGERY Left 01/10/2021   Procedure: ACHILLES LENGTHENING/KIDNER;  Surgeon: Rosetta Posner, DPM;  Location: [facility];  Service: Podiatry;  Laterality: Left;   AMPUTATION Left 10/03/2021   Procedure: AMPUTATION BELOW KNEE;  Surgeon: Renford Dills, MD;  Location: [facility];  Service: Vascular;  Laterality: Left;   AMPUTATION Right 05/24/2022   Procedure: AMPUTATION BELOW KNEE;  Surgeon: Wyn Quaker,  Marlow Baars, MD;  Location: [facility];  Service: General;  Laterality: Right;   AMPUTATION TOE Right 02/10/2016   Procedure: AMPUTATION TOE 3RD TOE;  Surgeon: Gwyneth Revels, DPM;  Location: [facility];  Service: Podiatry;  Laterality: Right;   AMPUTATION TOE Left 02/24/2020   Procedure: AMPUTATION TOE;  Surgeon: Rosetta Posner, DPM;  Location: [facility];  Service: Podiatry;  Laterality: Left;   APPLICATION OF WOUND VAC Left 02/29/2020   Procedure: APPLICATION OF WOUND VAC;  Surgeon: Rosetta Posner, DPM;  Location: [facility];  Service: Podiatry;  Laterality:  Left;   BIOPSY  03/15/2023   Procedure: BIOPSY;  Surgeon: Wyline Mood, MD;  Location: [facility] END[facility];  Service: Gastroenterology;;   CARDIAC CATHETERIZATION     CARDIOVERSION [name] 02/13/2022   Procedure: CARDIOVERSION;  Surgeon: Lamar Blinks, MD;  Location: [facility];  Service: Cardiovascular;  Laterality: [name];   COLONOSCOPY WITH PROPOFOL [name] 11/18/2015   Procedure: COLONOSCOPY WITH PROPOFOL;  Surgeon: Scot Jun, MD;  Location: [facility] ENDOSCOPY;  Service: Endoscopy;  Laterality: [name];   CORONARY ARTERY BYPASS GRAFT     CORONARY STENT INTERVENTION [name] 02/02/2020   Procedure: CORONARY STENT INTERVENTION;  Surgeon: Marcina Millard, MD;  Location: [facility];  Service: Cardiovascular;  Laterality: [name];   ESOPHAGOGASTRODUODENOSCOPY (EGD) WITH PROPOFOL [name] 03/15/2023   Procedure: ESOPHAGOGASTRODUODENOSCOPY (EGD) WITH PROPOFOL;  Surgeon: Wyline Mood, MD;  Location: [facility] ENDOSCOPY;  Service: Gastroenterology;  Laterality: [name];   GASTROSTOMY [name] 03/18/2023   Procedure: INSERTION OF GASTROSTOMY TUBE;  Surgeon: Carolan Shiver, MD;  Location: [facility];  Service: General;  Laterality: [name];   IMPACTION REMOVAL  03/15/2023   Procedure: IMPACTION REMOVAL;  Surgeon: Wyline Mood, MD;  Location: [facility] ENDOSCOPY;  Service: Gastroenterology;;   INCISION AND DRAINAGE Left 08/07/2021   Procedure: INCISION AND DRAINAGE-Partial Calcanectomy;  Surgeon: Rosetta Posner, DPM;  Location: [facility];  Service: Podiatry;  Laterality: Left;   IRRIGATION AND DEBRIDEMENT FOOT Left 02/29/2020   Procedure: IRRIGATION AND DEBRIDEMENT FOOT;  Surgeon: Rosetta Posner, DPM;  Location: [facility];  Service: Podiatry;  Laterality: Left;   IRRIGATION AND DEBRIDEMENT FOOT Left 02/24/2020   Procedure: IRRIGATION AND DEBRIDEMENT FOOT;  Surgeon: Rosetta Posner, DPM;  Location: [facility];  Service: Podiatry;  Laterality: Left;   KNEE ARTHROSCOPY     LEFT HEART CATH AND CORS/GRAFTS ANGIOGRAPHY [name] 02/02/2020   Procedure:  LEFT HEART CATH AND CORS/GRAFTS ANGIOGRAPHY;  Surgeon: Dalia Heading, MD;  Location: [facility];  Service: Cardiovascular;  Laterality: [name];   LOWER EXTREMITY ANGIOGRAPHY Left 02/25/2020   Procedure: Lower Extremity Angiography;  Surgeon: Annice Needy, MD;  Location: [facility];  Service: Cardiovascular;  Laterality: Left;   LOWER EXTREMITY ANGIOGRAPHY Left 01/04/2021   Procedure: LOWER EXTREMITY ANGIOGRAPHY;  Surgeon: Annice Needy, MD;  Location: [facility];  Service: Cardiovascular;  Laterality: Left;   LOWER EXTREMITY ANGIOGRAPHY Right 05/21/2022   Procedure: Lower Extremity Angiography;  Surgeon: Annice Needy, MD;  Location: [facility];  Service: Cardiovascular;  Laterality: Right;   METATARSAL HEAD EXCISION Left 01/10/2021   Procedure: METATARSAL HEAD EXCISION - LEFT 5th;  Surgeon: Rosetta Posner, DPM;  Location: [facility];  Service: Podiatry;  Laterality: Left;   PERIPHERAL VASCULAR CATHETERIZATION Right 01/24/2016   Procedure: Lower Extremity Angiography;  Surgeon: Renford Dills, MD;  Location: [facility];  Service: Cardiovascular;  Laterality: Right;   PERIPHERAL VASCULAR CATHETERIZATION Right 01/25/2016   Procedure: Lower Extremity Angiography;  Surgeon:  Renford Dills, MD;  Location: [facility];  Service: Cardiovascular;  Laterality: Right;   PORTACATH PLACEMENT [name] 03/18/2023   Procedure: INSERTION PORT-A-CATH;  Surgeon: Carolan Shiver, MD;  Location: [facility];  Service: General;  Laterality: [name];   TOE AMPUTATION     TONSILLECTOMY      Allergies: Allergies  Allergen Reactions   Penicillins Other (See Comments)    Happened at 67 years old and pt. stated he passed out  He has tolerated amoxicillin/clavulanate and ampicillin/sulbactam   Statins     Other reaction(s): Muscle Pain Causes legs to ache per pt   Metformin And Related Diarrhea    Home Medications: Medications Prior to Admission  Medication Sig  Dispense Refill Last Dose   acetaminophen (TYLENOL) 325 MG tablet Place 2 tablets (650 mg total) into feeding tube every 6 (six) hours as needed for mild pain (pain score 1-3) (or Fever >/= 101).   prn at unk   amiodarone (PACERONE) 200 MG tablet Place 1 tablet (200 mg total) into feeding tube daily.   03/27/2023   apixaban (ELIQUIS) 5 MG TABS tablet Place 1 tablet (5 mg total) into feeding tube 2 (two) times daily.   03/27/2023   ascorbic acid (VITAMIN C) 500 MG tablet Place 1 tablet (500 mg total) into feeding tube daily.   03/27/2023   aspirin (ASPIRIN CHILDRENS) 81 MG chewable tablet Place 1 tablet (81 mg total) into feeding tube daily.   03/27/2023   Cholecalciferol 25 MCG (1000 UT) tablet Place 1 tablet (1,000 Units total) into feeding tube daily. (Patient taking differently: Place 2,000 Units into feeding tube daily.)   03/27/2023   clonazePAM (KLONOPIN) 0.5 MG tablet Place 1 tablet (0.5 mg total) into feeding tube 2 (two) times daily.   03/27/2023   Cyanocobalamin (VITAMIN B-12) 5000 MCG TBDP Place 10,000 mcg into feeding tube daily.   03/27/2023   diltiazem (CARDIZEM) 30 MG tablet Place 2.5 tablets (75 mg total) into feeding tube 4 (four) times daily. 300 tablet 0 03/27/2023   ezetimibe (ZETIA) 10 MG tablet Place 1 tablet (10 mg total) into feeding tube at bedtime.   03/27/2023   ferrous sulfate 300 (60 Fe) MG/5ML syrup Place 5 mLs (300 mg total) into feeding tube daily. (Patient taking differently: Place 6.8 mLs into feeding tube daily.) 150 mL 0 03/27/2023   finasteride (PROSCAR) 5 MG tablet Take 1 tablet by mouth daily.   03/27/2023   furosemide (LASIX) 20 MG tablet Take 40 mg by mouth daily.   03/27/2023   gabapentin (NEURONTIN) 300 MG capsule Place 1 capsule (300 mg total) into feeding tube at bedtime.   03/27/2023   HUMALOG 100 UNIT/ML injection Inject 15 Units into the skin 3 (three) times daily with meals. >200   03/27/2023   Infant Care Products (DERMACLOUD) OINT Apply 1 Application  topically daily as needed.   03/27/2023   insulin glargine (LANTUS) 100 UNIT/ML injection Inject 0.2 mLs (20 Units total) into the skin 2 (two) times daily. Inject 15 units into the skin daily in AM and inject 25 units into the skin daily in PM (Patient taking differently: Inject 50 Units into the skin 2 (two) times daily.)   03/27/2023 at am   IRON SUPPLEMENT 220 (44 Fe) MG/5ML SOLN Take 5 mLs by mouth daily.   03/27/2023   isosorbide mononitrate (IMDUR) 60 MG 24 hr tablet Take 60 mg by mouth in the morning and at bedtime.   03/27/2023   levothyroxine (SYNTHROID)  25 MCG tablet Place 1 tablet (25 mcg total) into feeding tube daily at 6 (six) AM.   03/27/2023   lidocaine-prilocaine (EMLA) cream Apply on the port. 30 -45 min  prior to port access. 30 g 3 Past Week   losartan (COZAAR) 50 MG tablet Take 1 tablet by mouth daily.   03/27/2023   Multiple Vitamins-Minerals (MULTIVITAMIN WITH MINERALS) tablet Take 1 tablet by mouth daily.   03/27/2023   mupirocin ointment (BACTROBAN) 2 % Apply 1 Application topically daily. (Patient taking differently: Apply 1 Application topically 3 (three) times daily. On stump) 22 g 3 03/27/2023   nitrofurantoin, macrocrystal-monohydrate, (MACROBID) 100 MG capsule Place 1 capsule (100 mg total) into feeding tube daily.   03/27/2023   nitroGLYCERIN (NITROSTAT) 0.4 MG SL tablet Place 1 tablet (0.4 mg total) under the tongue every 5 (five) minutes as needed for chest pain. 25 tablet 3 prn at unk   nystatin (MYCOSTATIN/NYSTOP) powder Apply 1 Application topically 2 (two) times daily. Under belly   03/27/2023   ondansetron (ZOFRAN) 4 MG tablet Place 1 tablet (4 mg total) into feeding tube every 6 (six) hours as needed for nausea.   prn at unk   pramipexole (MIRAPEX) 1 MG tablet Place 2 tablets (2 mg total) into feeding tube at bedtime.   03/27/2023   primidone (MYSOLINE) 250 MG tablet Place 1 tablet (250 mg total) into feeding tube 2 (two) times daily.   03/27/2023 at pm    tamsulosin (FLOMAX) 0.4 MG CAPS capsule Take 0.4 mg by mouth.   03/27/2023   Vitamins A & D (VITAMIN A & D) ointment Apply 1 Application topically as needed for dry skin. Apply to stumps   03/27/2023   zinc sulfate, 50mg  elemental zinc, 220 (50 Zn) MG capsule Place 1 capsule (220 mg total) into feeding tube daily.   03/27/2023   Continuous Blood Gluc Sensor (FREESTYLE LIBRE 3 SENSOR) MISC       Continuous Glucose Sensor (FREESTYLE LIBRE 3 SENSOR) MISC Use 1 sensor every 14 (fourteen) days. 6 each 3    furosemide (LASIX) 20 MG tablet Take 1 tablet (20 mg total) by mouth ONCE OR TWICE daily (total daily dose maximum 40 mg)  via feeding tube as needed for increased swelling, shortness of breath, weight gain 5+ lbs over 1-2 days. Seek medical care if these symptoms are not improving with increased dose, or if you become dizzy or have low blood pressure. (Patient not taking: Reported on 03/28/2023)   Not Taking   nutrition supplement, JUVEN, (JUVEN) PACK Place 1 packet into feeding tube 2 (two) times daily between meals.      Nutritional Supplements (FEEDING SUPPLEMENT, OSMOLITE 1.5 CAL,) LIQD Place 474 mLs into feeding tube 4 (four) times daily -  before meals and at bedtime.      Home medication reconciliation was completed with the patient.   Scheduled Inpatient Medications:    apixaban  5 mg Per Tube BID   ascorbic acid  500 mg Per Tube Daily   aspirin  81 mg Per Tube Daily   Chlorhexidine Gluconate Cloth  6 each Topical Daily   cholecalciferol  2,000 Units Per Tube Daily   clonazePAM  0.5 mg Per Tube BID   colestipol  5 g Per Tube Q12H   cyanocobalamin  10,000 mcg Per Tube Daily   diltiazem  60 mg Oral Q6H   ezetimibe  10 mg Per Tube Daily   feeding supplement (GLUCERNA 1.5 CAL)  356 mL Per  Tube TID PC & HS   ferrous sulfate  300 mg Per Tube Daily   fiber  1 packet Per Tube BID   fidaxomicin  200 mg Tube BID   free water  250 mL Per Tube TID PC & HS   insulin aspart  0-5 Units  Subcutaneous QHS   insulin aspart  0-9 Units Subcutaneous TID WC   insulin glargine-yfgn  30 Units Subcutaneous QHS   levothyroxine  25 mcg Per Tube Q0600   metoprolol tartrate  25 mg Per Tube Q6H   multivitamin with minerals  1 tablet Oral Daily   mupirocin ointment  1 Application Nasal BID   pramipexole  2 mg Per Tube QHS   primidone  250 mg Per Tube BID   sodium zirconium cyclosilicate  10 g Oral BID    Continuous Inpatient Infusions:    PRN Inpatient Medications:  acetaminophen, haloperidol, ipratropium-albuterol, ondansetron, mouth rinse  Family History: family history includes Alcohol abuse in his father; Coronary artery disease in his father; Heart failure in his brother; Hypertension in his father.  The patient's family history is negative for inflammatory bowel disorders, GI malignancy, or solid organ transplantation.  Social History:   reports that he quit smoking about 8 years ago. His smoking use included cigarettes. He started smoking about 53 years ago. He has a 22.5 pack-year smoking history. He has never used smokeless tobacco. He reports that he does not currently use alcohol after a past usage of about 3.0 standard drinks of alcohol per week. He reports that he does not use drugs. The patient denies ETOH, tobacco, or drug use.   Review of Systems: Constitutional: Weight is stable.  Eyes: No changes in vision. ENT: No oral lesions, sore throat.  GI: see HPI.  Heme/Lymph: No easy bruising.  CV: No chest pain.  GU: No hematuria.  Integumentary: No rashes.  Neuro: No headaches.  Psych: No depression/anxiety.  Endocrine: No heat/cold intolerance.  Allergic/Immunologic: No urticaria.  Resp: No cough, SOB.  Musculoskeletal: No joint swelling.    Physical Examination: BP 105/81 (BP Location: Right Arm)   Pulse (!) 122   Temp 98 F (36.7 C)   Resp 19   Ht 5\' 5"  (1.651 m) Comment: Bilateral BKAs  Wt 123.6 kg   SpO2 96%   BMI 45.34 kg/m  Gen: NAD, alert and  oriented x 4 HEENT: PEERLA, EOMI, Neck: supple, no JVD or thyromegaly Chest: CTA bilaterally, no wheezes, crackles, or other adventitious sounds CV: RRR, no m/g/c/r Abd: soft, obese abdomen, ND, +BS in all four quadrants; nontender to palpation in all four quadrants, +G-tube present in LUQ, no HSM, guarding, ridigity, or rebound tenderness Ext: no edema, well perfused with 2+ pulses, Skin: no rash or lesions noted Lymph: no LAD  Data: Lab Results  Component Value Date   WBC 12.6 (H) 04/07/2023   HGB 12.2 (L) 04/07/2023   HCT 37.1 (L) 04/07/2023   MCV 89.0 04/07/2023   PLT 351 04/07/2023   Recent Labs  Lab 04/05/23 0506 04/06/23 0649 04/07/23 0445  HGB 12.1* 12.0* 12.2*   Lab Results  Component Value Date   NA 132 (L) 04/07/2023   K 5.3 (H) 04/07/2023   CL 99 04/07/2023   CO2 23 04/07/2023   BUN 32 (H) 04/07/2023   CREATININE 1.14 04/07/2023   Lab Results  Component Value Date   ALT 18 03/28/2023   AST 14 (L) 03/28/2023   ALKPHOS 120 03/28/2023   BILITOT 0.4 03/28/2023  No results for input(s): "APTT", "INR", "PTT" in the last 168 hours.  Assessment/Plan:  67 y/o Caucasian male with a PMH of morbid obesity (BMI 45), HTN, HLD, T2DM, depression, PVD, OSA on CPAP, CAD, CHF, PAF on chronic anticoagulation, and recently diagnosed esophageal adenocarcinoma s/p gastrostomy tube placement presented to the [facility] ED 11/28 for chief complaint of generalized weakness and diarrhea found to have C diff infection. GI consulted this AM for persistent diarrhea despite Dificid treatment (Day 10).   Clostridium difficile diarrhea - currently on Day 10 of Dificid. No improvement with fiber supplement, Colestid, and Banatrol.   Esophageal cancer s/p chemoradiation with gastrostomy tube placement  AKI 2/2 dehydration  UTI  Possible right perinephric abscess  Chronic persistent atrial fibrillation with RVR  Chronic dCHF  COPD  T2DM  PVD  Morbid obesity (BMI  45)  Recommendations:  - Continue supportive care and symptomatic management - Discontinue fiber supplement, could be making diarrhea worse - Continue Dificid 200 mg PO BID to complete 10-day course - Continue Banatrol (won't be available until Monday) - Discontinue Colestid given potential inactivation of antibiotic absorption - Diarrhea could also be exacerbated by tube feedings - Consider addition of vancomycin - Continue to monitor symptoms - If no clinical improvement in 24-48 hours, consider flexible sigmoidoscopy with random biopsies to exclude microscopic colitis, IBD, other colitis, etc - Consider ID consult depending on clinical course  - OK to continue tube feeds - GI following along with you   Thank you for the consult. Please call with questions or concerns.  Gilda Crease, PA-C [facility]astroenter[facility] [phone]

## 2023-04-07 NOTE — Consult Note (Signed)
Consultation Note Date: 04/07/2023   Patient Name: [name].  DOB: 1956/02/04  MRN: [id number]  Age / Sex: 67 y.o., male  PCP: [name], MD Referring Physician: Kathrynn Running, MD  Reason for Consultation: Establishing goals of care   HPI/Brief Hospital Course: 67 y.o. male  with past medical history of A. Fib, CHF, COPD, CAD, bilateral BKA, T2DM, PVD, OSA on CPAP, recent diagnosis of esophageal cancer s/p g-tube placement and chemotherapy admitted from home on 03/28/2023 with generalized weakness and diarrhea.  Found to have C. Diff, course has been complicated by acute delirium and continued loose stools, dietician, ID and GI have been consulted  Noted recent admission 11/14-11/28 for dysphagia with new findings of esophageal adenocarcinoma, s/p g tube placement   Palliative medicine was consulted for assisting with goals of care conversations.  Subjective:  Extensive chart review has been completed prior to meeting patient including labs, vital signs, imaging, progress notes, orders, and available advanced directive documents from current and previous encounters.  Visited with [name] at his bedside. He is awake, alert, answers most orientation questions appropriately with moments of confusion but easily redirected.  Introduced myself as a Publishing rights manager as a member of the palliative care team. Explained palliative medicine is specialized medical care for people living with serious illness. It focuses on providing relief from the symptoms and stress of a serious illness. The goal is to improve quality of life for both the patient and the family.   [name] shares he and his wife-[name] have been married for about 3 years. His fist wife passed away, he had 2 daughters from his previous marriage but unfortunately they have also passed away.  Mr. Loughlin able to share his understanding of current medical condition.  Aware he is being treated for C. Diff, hopeful GI doctor who stopped in earlier today will be able to help improve diarrhea.  [name] shares he has not completed advanced directive documents in the past. He was scheduled to meet with a lawyer in a few weeks to complete paperwork.  [name] shares his wife [name] is unable to visit as she is unable to drive due to recent invasive eye procedure. He gives me permission to call and speak with [name].  [name] also able to share her understanding of current medical situation. Assured [name] urine culture has been ordered due to her concern for UTI and delirium. [name] shares the many health struggles Mr. Guzek has endured over the years.  We discussed patient's current illness and what it means in the larger context of patient's on-going co-morbidities. Natural disease trajectory and expectations at EOL were discussed.   We discussed code status and the difference between Full Code and Do Not Resuscitate. Sherries shares [name] has always been clear he wishes for resuscitation but [name] feels it is appropriate for them address them amongst themselves again.  I discussed importance of continued conversations with family/support persons and all members of their medical team regarding overall plan of care and treatment options ensuring decisions are in alignment with patients goals of care.  Recommended outpatient palliative care, [name] agrees and consents to referral being placed. Encouraged [name] to reach out to PMT for any needs or concerns as they arise.   Objective: Primary Diagnoses: Present on Admission:  Hyperkalemia  Acute hyperkalemia  Malnutrition of moderate degree  Acute kidney injury (HCC)  Delirium   Vital Signs: BP 102/78 (BP Location: Left Arm)   Pulse Marland Kitchen)  118   Temp 97.9 F (36.6 C)   Resp 19   Ht 5\' 5"  (1.651 m) Comment: Bilateral BKAs  Wt 123.6 kg   SpO2 96%   BMI 45.34 kg/m  Pain Scale: 0-10    Pain Score: 0-No pain IO: Intake/output summary:  Intake/Output Summary (Last 24 hours) at 04/07/2023 1642 Last data filed at 04/07/2023 0436 Gross per 24 hour  Intake 846 ml  Output 475 ml  Net 371 ml    LBM: Last BM Date : 04/07/23 Baseline Weight: Weight: 130 kg Most recent weight: Weight: 123.6 kg      Assessment and Plan  SUMMARY OF RECOMMENDATIONS   Outpatient PMT referral  Palliative Prophylaxis:   Bowel Regimen, Delirium Protocol and Frequent Pain Assessment   Thank you for this consult and allowing Palliative Medicine to participate in the care of [name] Malkin Jr. Palliative medicine will continue to follow and assist as needed.   Time Total: 75 minutes  Time spent includes: Detailed review of medical records (labs, imaging, vital signs), medically appropriate exam (mental status, respiratory, cardiac, skin), discussed with treatment team, counseling and educating patient, family and staff, documenting clinical information, medication management and coordination of care.   Signed by: Leeanne Deed, DNP, AGNP-C Palliative Medicine    Please contact Palliative Medicine Team phone at [phone] for questions and concerns.  For individual provider: See Loretha Stapler

## 2023-04-07 NOTE — Progress Notes (Signed)
Rounding Note    Patient Name: [name]. Date of Encounter: 04/07/2023  [facility] Cardiologist: Lorine Bears, MD   Subjective   Comfortable in bed today, mental status improved. Continues to have profuse diarrhea.  Inpatient Medications    Scheduled Meds:  apixaban  5 mg Per Tube BID   ascorbic acid  500 mg Per Tube Daily   aspirin  81 mg Per Tube Daily   Chlorhexidine Gluconate Cloth  6 each Topical Daily   cholecalciferol  2,000 Units Per Tube Daily   clonazePAM  0.5 mg Per Tube BID   cyanocobalamin  10,000 mcg Per Tube Daily   diltiazem  60 mg Oral Q6H   ezetimibe  10 mg Per Tube Daily   feeding supplement (GLUCERNA 1.5 CAL)  356 mL Per Tube TID PC & HS   ferrous sulfate  300 mg Per Tube Daily   fidaxomicin  200 mg Tube BID   free water  250 mL Per Tube TID PC & HS   insulin aspart  0-5 Units Subcutaneous QHS   insulin aspart  0-9 Units Subcutaneous TID WC   insulin glargine-yfgn  30 Units Subcutaneous QHS   levothyroxine  25 mcg Per Tube Q0600   metoprolol tartrate  25 mg Per Tube Q6H   multivitamin with minerals  1 tablet Oral Daily   mupirocin ointment  1 Application Nasal BID   pramipexole  2 mg Per Tube QHS   primidone  250 mg Per Tube BID   sodium zirconium cyclosilicate  10 g Oral BID   vancomycin  500 mg Oral BID   Continuous Infusions:  PRN Meds: acetaminophen, haloperidol, ipratropium-albuterol, ondansetron, mouth rinse   Vital Signs    Vitals:   04/07/23 0436 04/07/23 0752 04/07/23 1208 04/07/23 1210  BP: 100/69 105/81 (!) 81/63 95/73  Pulse: (!) 122 (!) 122 (!) 120 (!) 107  Resp: 19     Temp: (!) 97.5 F (36.4 C) 98 F (36.7 C)    TempSrc: Oral     SpO2: 93% 96% 94%   Weight:      Height:        Intake/Output Summary (Last 24 hours) at 04/07/2023 1307 Last data filed at 04/07/2023 0436 Gross per 24 hour  Intake 846 ml  Output 1075 ml  Net -229 ml      04/06/2023    4:00 AM 04/05/2023    3:20 AM 04/04/2023     3:45 AM  Last 3 Weights  Weight (lbs) 272 lb 7.8 oz 279 lb 8.7 oz 280 lb 10.3 oz  Weight (kg) 123.6 kg 126.8 kg 127.3 kg      Telemetry    Atrial flutter with rates 120s - Personally Reviewed  Physical Exam   GEN: No acute distress.   Neck: No JVD Cardiac: tachycardic, RRR, no murmurs, rubs, or gallops.  Respiratory: Clear to auscultation bilaterally. GI: Soft, nontender, non-distended, PEG tube in place MS: b/l BKA Neuro:  Nonfocal   New pertinent results (labs, ECG, imaging, cardiac studies)     Patient Profile     67 y.o. male with a hx of frequent falls, PAD s/p b/l BKA, DM2, afib RVR with prior cardioversion, moderate LVH, CAD with CABG in 2006, HTN, esophageal cancer s/p chemo and G-tube placement, and CKD who is being seen for aflutter RVR   Assessment & Plan    Atrial flutter with RVR Paroxysmal atrial fibrillation -CHA2DS2/VAS Stroke Risk Points=5 -had window without anticoagulation,  now back on apixaban -not a candidate for TEE given esophageal cancer -attempting to rate control with g tube meds as he pulled out lines earlier this admission, however rates remain elevated -currently on diltiazem 60 mg q6 hours and metoprolol 25 mg q6 hours.  -BP continues to be low. As we cannot get rate control with diltiazem and metoprolol, will use amiodarone despite window of missed anticoagulation. Best rate control is with IV, but he does not currently have PIV. With frequent diarrhea, concerned about GI absorption of meds. Will discuss with primary team and bedside nurse whether PIV placement is an option today -received IV digoxin x2 earlier in admission without significant improvement  Acute metabolic encephalopathy Esophageal cancer s/p g tube C diff -per primary team  CAD s/p CABG 2006, PAD s/p BKA -on aspirin and DOAC -on ezetimibe while inpatient    Signed, Jodelle Red, MD  04/07/2023, 1:07 PM

## 2023-04-07 NOTE — Progress Notes (Signed)
[facility]  - [place] [facility]   PATIENT NAME: [name]    MR#:  [id number]  DATE OF BIRTH:  [birth year]  SUBJECTIVE:  Resting in bed, no complaints  VITALS:  Blood pressure 105/81, pulse (!) 122, temperature 98 F (36.7 C), resp. rate 19, height 5\' 5"  (1.651 m), weight 123.6 kg, SpO2 96%.  PHYSICAL EXAMINATION:   GENERAL:  67 y.o.-year-old patient with no acute distress. obese, chronically ill LUNGS:decreased breath sounds bilaterally, no wheezing CARDIOVASCULAR: S1, S2 normal. tachycardia ABDOMEN: Soft, nontender, nondistended. PEG+ EXTREMITIES: Bilateral amputation stump  NEUROLOGIC: moves all 4. aaox3  LABORATORY PANEL:  CBC Recent Labs  Lab 04/07/23 0445  WBC 12.6*  HGB 12.2*  HCT 37.1*  PLT 351    Chemistries  Recent Labs  Lab 04/05/23 0506 04/06/23 0649 04/07/23 0445  NA 133*   < > 132*  K 4.8   < > 5.3*  CL 99   < > 99  CO2 25   < > 23  GLUCOSE 219*   < > 171*  BUN 33*   < > 32*  CREATININE 1.08   < > 1.14  CALCIUM 8.6*   < > 8.8*  MG 2.2  --   --    < > = values in this interval not displayed.   Assessment and Plan  Alec A Aiydan Strabala. is a 67 y.o. male with medical history significant of atrial fibrillation, asthma, congestive heart failure, COPD, coronary artery disease, depression, diabetes mellitus, gout, hyperlipidemia, hypertension, hypogonadism in male, morbid obesity, peripheral vascular disease, OSA on CPAP at night, esophageal cancer status post chemo and G-tube placement.  Patient currently being managed for hyperkalemia, severe dehydration and AKI and acute metabolic encephalopathy.   Acute kidney injury secondary to dehydration Appears resolved --Avoid nephrotoxic medications --cont free water thru PEG  Delirium Intermittent, delirious yesterday, today resolved - haldol prn  UTI Wife convinced AMS 2/2 uti as thinks has presented this way in the past. Cathed urine specimen from yesterday does appear  to have bacteria, culture pending. Today however delirium resolved, no fever, no suprapubic pain/tenderness, patient denies dysuria. And obviously antibiotics will not help his c diff infection - f/u culture results, monitor for overt symptoms of uti, holding on abx for now   Acute diarrhea secondary to C. difficile infection Continues to have profuse watery BMs multiple times a day --Continue on Dificid, today is day 10 -- have added fiber to tube feeds, also added banatrol and colestid, banatrol won't be available until monday --formal gi consult today -- may need to get ID involved if we think this is lack of response to dificid. Unfortunately tube feeds can also do this   Possible right perinephric abscess --Interventional radiology on board and planning outpatient aspiration as according to them this is less likely an abscess --According to urology Dr. Vanna Wood she believes that this is chronic from previous trauma/hematoma formation   Severe life-threatening hyperkalemia-improved Mild elevation today - dose lokelma will increase to bid    Chronic persistent atrial fibrillation with rvr Tachycardia Cardiology now following On apixaban On dilt and metop currently may need amio infusion despite anticoag interruption.    Urinary retention Foley discontinued , now voiding, bladder scan neg, thus appears to be resolved   Chronic diastolic congestive heart failure --Recent EF of 55% on echo obtained on November 2024  COPD --Continue as needed nebulization    Coronary artery disease,  --Continue aspirin therapy  Diabetes mellitus type 2 with hyperglycemia --Continue insulin therapy but will increase basal from 28 to 30  Hypothyroidism --on levothyroxine     Nutrition Status: Nutrition Problem: Moderate Malnutrition Etiology: chronic illness (esopahgeal mass, likely malginant) Signs/Symptoms: mild fat depletion, mild muscle depletion Interventions: Tube  feeding  Peripheral vascular disease --Continue aspirin  --Patient is allergic to statin therapy   OSA on CPAP at night,  --CPAP at night   Esophageal cancer status post chemo and G-tube placement. --pt gets Bolus TF      Advance Care Planning:   full code confirmed with wife 12/6  Consults: Interventional radiology, urology  Family Communication: wife updated telephonically 12/8 DVT Prophylaxis :Eliquis Level of care: Progressive Status is: Inpatient Remains inpatient appropriate because: diarrhea persists, ongoing rvr    Silvano Bilis M.D.   [facility]

## 2023-04-08 ENCOUNTER — Ambulatory Visit: Payer: Medicare Other

## 2023-04-08 ENCOUNTER — Encounter: Payer: Self-pay | Admitting: Internal Medicine

## 2023-04-08 ENCOUNTER — Other Ambulatory Visit: Payer: Medicare Other

## 2023-04-08 DIAGNOSIS — R410 Disorientation, unspecified: Secondary | ICD-10-CM | POA: Diagnosis not present

## 2023-04-08 DIAGNOSIS — I25119 Atherosclerotic heart disease of native coronary artery with unspecified angina pectoris: Secondary | ICD-10-CM | POA: Diagnosis not present

## 2023-04-08 DIAGNOSIS — E875 Hyperkalemia: Secondary | ICD-10-CM | POA: Diagnosis not present

## 2023-04-08 DIAGNOSIS — N179 Acute kidney failure, unspecified: Secondary | ICD-10-CM | POA: Diagnosis not present

## 2023-04-08 DIAGNOSIS — I4892 Unspecified atrial flutter: Secondary | ICD-10-CM | POA: Diagnosis not present

## 2023-04-08 LAB — GLUCOSE, CAPILLARY
Glucose-Capillary: 207 mg/dL — ABNORMAL HIGH (ref 70–99)
Glucose-Capillary: 213 mg/dL — ABNORMAL HIGH (ref 70–99)
Glucose-Capillary: 214 mg/dL — ABNORMAL HIGH (ref 70–99)
Glucose-Capillary: 220 mg/dL — ABNORMAL HIGH (ref 70–99)
Glucose-Capillary: 252 mg/dL — ABNORMAL HIGH (ref 70–99)
Glucose-Capillary: 252 mg/dL — ABNORMAL HIGH (ref 70–99)
Glucose-Capillary: 259 mg/dL — ABNORMAL HIGH (ref 70–99)

## 2023-04-08 LAB — BASIC METABOLIC PANEL
Anion gap: 9 (ref 5–15)
BUN: 36 mg/dL — ABNORMAL HIGH (ref 8–23)
CO2: 25 mmol/L (ref 22–32)
Calcium: 8.7 mg/dL — ABNORMAL LOW (ref 8.9–10.3)
Chloride: 99 mmol/L (ref 98–111)
Creatinine, Ser: 1.25 mg/dL — ABNORMAL HIGH (ref 0.61–1.24)
GFR, Estimated: >60 mL/min (ref >60)
Glucose, Bld: 190 mg/dL — ABNORMAL HIGH (ref 70–99)
Potassium: 4.9 mmol/L (ref 3.5–5.1)
Sodium: 133 mmol/L — ABNORMAL LOW (ref 135–145)

## 2023-04-08 LAB — CBC
HCT: 36.1 % — ABNORMAL LOW (ref 39.0–52.0)
Hemoglobin: 11.8 g/dL — ABNORMAL LOW (ref 13.0–17.0)
MCH: 29.4 pg (ref 26.0–34.0)
MCHC: 32.7 g/dL (ref 30.0–36.0)
MCV: 90 fL (ref 80.0–100.0)
Platelets: 355 10*3/uL (ref 150–400)
RBC: 4.01 MIL/uL — ABNORMAL LOW (ref 4.22–5.81)
RDW: 14.4 % (ref 11.5–15.5)
WBC: 16.4 10*3/uL — ABNORMAL HIGH (ref 4.0–10.5)
nRBC: 0 % (ref 0.0–0.2)

## 2023-04-08 MED ORDER — INSULIN GLARGINE-YFGN 100 UNIT/ML ~~LOC~~ SOLN
35.0000 [IU] | Freq: Every day | SUBCUTANEOUS | Status: DC
  Administered 2023-04-08: 35 [IU] via SUBCUTANEOUS
  Filled 2023-04-08 (×2): qty 0.35

## 2023-04-08 MED ORDER — HALOPERIDOL 5 MG PO TABS
5.0000 mg | ORAL_TABLET | Freq: Four times a day (QID) | ORAL | Status: DC | PRN
  Administered 2023-04-08 – 2023-04-13 (×4): 5 mg
  Filled 2023-04-08 (×5): qty 1

## 2023-04-08 MED ORDER — SODIUM ZIRCONIUM CYCLOSILICATE 10 G PO PACK
10.0000 g | PACK | Freq: Once | ORAL | Status: AC
  Administered 2023-04-08: 10 g via ORAL
  Filled 2023-04-08: qty 1

## 2023-04-08 MED ORDER — BANATROL TF EN LIQD
60.0000 mL | Freq: Two times a day (BID) | ENTERAL | Status: DC
  Administered 2023-04-08 – 2023-04-17 (×17): 60 mL

## 2023-04-08 NOTE — Progress Notes (Addendum)
Patient Name: [name]. Date of Encounter: 04/08/2023 [facility] Cardiologist: Lorine Bears, MD   Interval Summary  .    Patient seen on a.m. rounds.  Denies any chest pain, palpitations, or shortness of breath.  Mental status still not back at baseline.  Still feels somewhat confused.  Hoping to try to go to the bathroom.  Vital Signs .    Vitals:   04/08/23 0025 04/08/23 0358 04/08/23 0500 04/08/23 0839  BP: 94/60   120/82  Pulse:    (!) 120  Resp:    18  Temp: 97.8 F (36.6 C) 98.6 F (37 C)  98.3 F (36.8 C)  TempSrc: Oral Oral  Oral  SpO2:    96%  Weight:   122.1 kg   Height:        Intake/Output Summary (Last 24 hours) at 04/08/2023 1131 Last data filed at 04/08/2023 0913 Gross per 24 hour  Intake 3541.32 ml  Output 900 ml  Net 2641.32 ml      04/08/2023    5:00 AM 04/06/2023    4:00 AM 04/05/2023    3:20 AM  Last 3 Weights  Weight (lbs) 269 lb 2.9 oz 272 lb 7.8 oz 279 lb 8.7 oz  Weight (kg) 122.1 kg 123.6 kg 126.8 kg      Telemetry/ECG    Atrial flutter rates of 120s- Personally Reviewed  Physical Exam .   GEN: No acute distress.   Neck: No JVD Cardiac: RRR, tachycardic no murmurs, rubs, or gallops.  Respiratory: Clear to auscultation bilaterally.  Respirations are unlabored at rest on room air GI: Soft, nontender, non-distended  MS: Bilateral BKA  Assessment & Plan .     Atrial flutter with RVR -History of paroxysmal atrial fibrillation -CHA2DS2-VASc score of at least 5 -Continued on apixaban for stroke prophylaxis -Currently with rates in the 120s on telemetry monitoring -He does have missed doses of oral anticoagulation -Not a candidate for TEE given esophageal cancer -Amiodarone increased from 30 milligrams per hour to 60 mg/h -Continued on diltiazem 30 mg every 6 hours and metoprolol tartrate 25 mg every 6 hours, unfortunately soft blood pressures prevent up titration of oral medications -Previously received IV  digoxin x 2 earlier in admission without significant improvement -With an increase in amiodarone if continued elevated heart rates may have to transition to p.o. metoprolol to IV as with his current GI issues there may be malabsorption of his oral medications given through his G-tube -Continue on telemetry monitoring -Potassium level 4.9 centimeters 2.2 we will recheck magnesium level again in a.m. as well  Acute metabolic encephalopathy -Esophageal cancer status post G-tube -C. difficile with profuse diarrhea -Continued altered mental status with increased agitation and pulling IVs and monitors off -Continues to require safety observations -Continued management per IM  CAD status post CABG/PAD status post BKA -Patient denies any chest discomfort -Continued on aspirin and apixaban as well as ezetimibe -Continued on telemetry monitoring -EKG for pain or changes For questions or updates, please contact [facility] Please consult [email or web address] for contact info under        Signed, [name] HAMMOCK, NP    ATTENDING ATTESTATION  I have seen, examined and evaluated the patient this afternoon along with [name] hammock, NP.  After reviewing all the available data and chart, we discussed the patients laboratory, study & physical findings as well as symptoms in detail.  I agree with her findings, examination as well as impression recommendations as per  our discussion.    Attending adjustments noted in italics.   Agree with above.  We have increased his amiodarone drip for at least the next 24 hours, would probably go back to 30 mg/h tomorrow versus convert to p.o. depending on his heart rate. I do question his oral observed in and it may be reasonable to consider converting to IV metoprolol depending on how his GI issues are going.  Thankfully, no angina. Will continue to follow.   Marykay Lex, MD, MS Bryan Lemma, M.D., M.S. Interventional Cardiologist  [facility]  HeartCare  Pager # [phone] Phone # [phone] [address] [place], [place] [address]

## 2023-04-08 NOTE — Progress Notes (Signed)
The patient has gotten more agitated, confused and now aggressive as the shift has worn on. He has been awake this entire shift hallucinating seeing people and his wife outside of the window. Redirecting does not work. He does not believe what staff is telling him. He has been calling his wife cell phone since midnight. She has not answered increasing his agitation. He has called the police/911 twice. The tele-sitter has been redirecting him more than 3 times in an hour repeatedly during the shift.He continues to attempt to get out of bed. He believes he will be walking to his car to go to court today. His prosthetics are not here.  The patient agrees that they are at home. Nevertheless he continues to attempt to get out of the bed to stand on his bilateral stumps.    He has now thrown his empty urinal and turned the IV pole around. The patient continues to need tele sitter services.

## 2023-04-08 NOTE — Progress Notes (Signed)
Brief Nutrition Follow-Up Note  Case discussed with RN and pharmacy. Banatrol shipment available today and will be delivered to floor.   Per GI notes, plan to continue banatrol supplement once available. Noted pt removed rectal tube on 04/05/23. Per GI, flex sig could be considered in 1-2 days if symptoms not improving. GI also recommending ID consult if pt does not improve. Per GI, diarrhea seems to be improving.   Labs reviewed: CBGS: [id number] (inpatient orders for glycemic control are 0-5 units insulin aspart daily at bedtime, 0-9 units insulin aspart TID with meals, and 35 units insulin glargine-yfgn daily).    INTERVENTION:    -TF via g-tube:    356 ml Glucerna 1.5 4 times daily   115 ml free water flush before and after each feeding administration   Tube feeding regimen provides 2136 kcal (100% of needs), 118 grams of protein, and 1080 ml of H2O.  Total free water: 2000 ml daily   -Allow ice chips for dry mouth per discussion with MD -1 packet Banatrol BID  -D/c Nutrisource Fiber BID  Levada Schilling, RD, LDN, CDCES Registered Dietitian III Certified Diabetes Care and Education Specialist Please refer to [facility] for RD and/or RD on-call/weekend/after hours pager

## 2023-04-08 NOTE — Progress Notes (Addendum)
[facility]  - [place] at [facility]   PATIENT NAME: [name]    MR#:  [id number]  DATE OF BIRTH:  1955/05/23  SUBJECTIVE:  Resting in bed, one bm thus far today, no abd pain, no suprapubic pain or dysuria  VITALS:  Blood pressure 104/66, pulse (!) 123, temperature 98.1 F (36.7 C), temperature source Oral, resp. rate 19, height 5\' 5"  (1.651 m), weight 122.1 kg, SpO2 96%.  PHYSICAL EXAMINATION:   GENERAL:  67 y.o.-year-old patient with no acute distress. obese, chronically ill LUNGS:decreased breath sounds bilaterally, no wheezing CARDIOVASCULAR: S1, S2 normal. tachycardia ABDOMEN: Soft, nontender, nondistended. PEG+ EXTREMITIES: Bilateral amputation stump  NEUROLOGIC: moves all 4. aaox3  LABORATORY PANEL:  CBC Recent Labs  Lab 04/08/23 0433  WBC 16.4*  HGB 11.8*  HCT 36.1*  PLT 355    Chemistries  Recent Labs  Lab 04/05/23 0506 04/06/23 0649 04/08/23 0433  NA 133*   < > 133*  K 4.8   < > 4.9  CL 99   < > 99  CO2 25   < > 25  GLUCOSE 219*   < > 190*  BUN 33*   < > 36*  CREATININE 1.08   < > 1.25*  CALCIUM 8.6*   < > 8.7*  MG 2.2  --   --    < > = values in this interval not displayed.   Assessment and Plan  Javonte A Fallou Bahl. is a 67 y.o. male with medical history significant of atrial fibrillation, asthma, congestive heart failure, COPD, coronary artery disease, depression, diabetes mellitus, gout, hyperlipidemia, hypertension, hypogonadism in male, morbid obesity, peripheral vascular disease, OSA on CPAP at night, esophageal cancer status post chemo and G-tube placement.  Patient currently being managed for hyperkalemia, severe dehydration and AKI and acute metabolic encephalopathy.   Acute kidney injury secondary to dehydration Appears resolved --Avoid nephrotoxic medications --cont free water thru PEG  Delirium Intermittent, delirious this morning got haldol, improving now - haldol prn  End-of-life care Homero Fellers discussion  today with wife reviewing patient's bedbound status, multiple severe comorbidities, new cancer diagnosis with PEG tube for feeding, his acute medical problems that are proving difficult to treat, and his downward trend that includes 8 admissions in our system over the past 2 years. I shared his long-term prognosis is poor. She tells me "I wish you had never told me that, I don't want to hear any of that right now," so I will refrain from further such discussion.  Bacteriuria Wife convinced AMS 2/2 uti as thinks he has presented this way in the past. Cathed urine specimen from yesterday does have greater than 10^5 klebsiella which he has had in the past. But no fever, cva pain/tenderness, suprapubic pain/tenderness, dysuria, and does have c diff, so I am holding off on treatment for the time being and monitoring closely for development of clinical signs of a uti.  Acute diarrhea secondary to C. difficile infection Continues to have profuse watery BMs multiple times a day -GI consulted 12/8 --s/p 10 days Dificid, vanc added by GI on 12/8 -- fiber discontinued as was colestid -banatrol -GI is considering flex sig -- may need to get ID involved     Possible right perinephric hematoma --Described as possible abscess by radiology but per secure chat communication w/ Dr. Apolinar Junes on 12/9, this is a chornic complex hematoma dating back over a year, nothing that has been drainable thus far, possible underlying renal mass, and in light of  significant comorbidities and as he is asymptomatic inpatient evaluation has been deferred, plan is outpatient urology f/u, consider MRI then.  Severe life-threatening hyperkalemia Requiring intermittent lokelma -will give a dose today    Chronic persistent atrial fibrillation with rvr Tachycardia Cardiology now following On apixaban On dilt and metop currently  Amio infusion started today   Urinary retention Foley discontinued , now voiding spontaneously, bladder  scan neg, thus appears to be resolved   Chronic diastolic congestive heart failure --Recent EF of 55% on echo obtained on November 2024  COPD --Continue as needed nebulization    Coronary artery disease,  --Continue aspirin therapy   Diabetes mellitus type 2 with hyperglycemia --Continue insulin therapy but will increase basal from 30 to 35  Hypothyroidism --on levothyroxine     Nutrition Status: Nutrition Problem: Moderate Malnutrition Etiology: chronic illness (esopahgeal mass, likely malginant) Signs/Symptoms: mild fat depletion, mild muscle depletion Interventions: Tube feeding  Peripheral vascular disease --Continue aspirin  --Patient is allergic to statin therapy   OSA on CPAP at night,  --CPAP at night   Esophageal cancer status post chemo and G-tube placement. --pt gets Bolus TF      Advance Care Planning:   full code confirmed with wife 12/6  Consults: Interventional radiology, urology  Family Communication: wife updated telephonically 12/9 DVT Prophylaxis :Eliquis Level of care: Progressive Status is: Inpatient Remains inpatient appropriate because: diarrhea persists, ongoing rvr    Silvano Bilis M.D.   [facility]

## 2023-04-08 NOTE — Progress Notes (Addendum)
GI Inpatient Follow-up Note  Subjective:  Patient seen in follow-up for persistent diarrhea/hx of c diff infection. This morning patient became confused and more aggressive. Reportedly was hallucinating seeing people and his wife outside his room window. He is telling me he has to leave the hospital to go to court. He has been attempting to get out of bed. Tele sitter services have been required. Per nursing report, no bowel movements overnight or this morning.   Scheduled Inpatient Medications:   apixaban  5 mg Per Tube BID   ascorbic acid  500 mg Per Tube Daily   aspirin  81 mg Per Tube Daily   Chlorhexidine Gluconate Cloth  6 each Topical Daily   cholecalciferol  2,000 Units Per Tube Daily   clonazePAM  0.5 mg Per Tube BID   cyanocobalamin  10,000 mcg Per Tube Daily   diltiazem  60 mg Oral Q6H   ezetimibe  10 mg Per Tube Daily   feeding supplement (GLUCERNA 1.5 CAL)  356 mL Per Tube TID PC & HS   ferrous sulfate  300 mg Per Tube Daily   fidaxomicin  200 mg Tube BID   free water  250 mL Per Tube TID PC & HS   insulin aspart  0-5 Units Subcutaneous QHS   insulin aspart  0-9 Units Subcutaneous TID WC   insulin glargine-yfgn  30 Units Subcutaneous QHS   levothyroxine  25 mcg Per Tube Q0600   metoprolol tartrate  25 mg Per Tube Q6H   multivitamin with minerals  1 tablet Oral Daily   mupirocin ointment  1 Application Nasal BID   pramipexole  2 mg Per Tube QHS   primidone  250 mg Per Tube BID   sodium zirconium cyclosilicate  10 g Oral Once   vancomycin  500 mg Per Tube BID    Continuous Inpatient Infusions:    amiodarone 30 mg/hr (04/08/23 0400)    PRN Inpatient Medications:  acetaminophen, haloperidol, ipratropium-albuterol, ondansetron, mouth rinse  Review of Systems:  See HPI. Unable to obtain full ROS given mental status.    Physical Examination: BP 94/60 (BP Location: Left Arm)   Pulse (!) 114   Temp 98.6 F (37 C) (Oral)   Resp 19   Ht 5\' 5"  (1.651 m)  Comment: Bilateral BKAs  Wt 122.1 kg   SpO2 95%   BMI 44.79 kg/m  Gen: NAD, alert and oriented to person and place HEENT: PEERLA, EOMI, Neck: supple, no JVD or thyromegaly Chest: CTA bilaterally, no wheezes, crackles, or other adventitious sounds CV: RRR, no m/g/c/r Abd: soft, NT, ND, +BS in all four quadrants; no HSM, guarding, ridigity, or rebound tenderness, +PEG in place in LUQ Ext: no edema, well perfused with 2+ pulses, Skin: no rash or lesions noted Lymph: no LAD  Data: Lab Results  Component Value Date   WBC 16.4 (H) 04/08/2023   HGB 11.8 (L) 04/08/2023   HCT 36.1 (L) 04/08/2023   MCV 90.0 04/08/2023   PLT 355 04/08/2023   Recent Labs  Lab 04/06/23 0649 04/07/23 0445 04/08/23 0433  HGB 12.0* 12.2* 11.8*   Lab Results  Component Value Date   NA 133 (L) 04/08/2023   K 4.9 04/08/2023   CL 99 04/08/2023   CO2 25 04/08/2023   BUN 36 (H) 04/08/2023   CREATININE 1.25 (H) 04/08/2023   Lab Results  Component Value Date   ALT 18 03/28/2023   AST 14 (L) 03/28/2023   ALKPHOS 120 03/28/2023  BILITOT 0.4 03/28/2023   No results for input(s): "APTT", "INR", "PTT" in the last 168 hours.  Assessment/Plan:  67 y/o Caucasian male with a PMH of morbid obesity (BMI 45), HTN, HLD, T2DM, depression, PVD, OSA on CPAP, CAD, CHF, PAF on chronic anticoagulation, and recently diagnosed esophageal adenocarcinoma s/p gastrostomy tube placement presented to the [facility] ED 11/28 for chief complaint of generalized weakness and diarrhea found to have C diff infection. GI consulted this AM for persistent diarrhea despite Dificid treatment    Clostridium difficile diarrhea - s/p 10 days of Dificid. Vancomycin 500 mg BID started yesterday. Colestid discontinued yesterday.    Esophageal cancer s/p chemoradiation with gastrostomy tube placement   AKI 2/2 dehydration   UTI   Possible right perinephric abscess   Chronic persistent atrial fibrillation with RVR   Chronic dCHF   COPD    T2DM   PVD   Morbid obesity (BMI 45)  Altered mental status   Recommendations:  - Continue supportive care and symptomatic management - Continue Vancomycin 500 mg PO BID - Continue Banatrol (should be available today) - Diarrhea could be exacerbated by tube feedings.  - Continue to monitor and treat clinically - Diarrhea appears to be improving (no BM so far this morning).  - Repeat mental status checks recommended  - ID consult could be considered if symptoms fail to improve - Flexible sigmoidoscopy could be considered in 1-2 days if symptoms are not improving - Continue tube feeds for now - GI following along with you   Please call with questions or concerns.   Jacob Moores, PA-C [facility] [phone]

## 2023-04-08 NOTE — Plan of Care (Signed)
  Problem: Education: Goal: Ability to describe self-care measures that may prevent or decrease complications (Diabetes Survival Skills Education) will improve Outcome: Progressing Goal: Individualized Educational Video(s) Outcome: Progressing   Problem: Coping: Goal: Ability to adjust to condition or change in health will improve Outcome: Progressing   Problem: Fluid Volume: Goal: Ability to maintain a balanced intake and output will improve Outcome: Progressing   Problem: Health Behavior/Discharge Planning: Goal: Ability to identify and utilize available resources and services will improve Outcome: Progressing Goal: Ability to manage health-related needs will improve Outcome: Progressing   Problem: Metabolic: Goal: Ability to maintain appropriate glucose levels will improve Outcome: Progressing   Problem: Nutritional: Goal: Maintenance of adequate nutrition will improve Outcome: Progressing Goal: Progress toward achieving an optimal weight will improve Outcome: Progressing   Problem: Skin Integrity: Goal: Risk for impaired skin integrity will decrease Outcome: Progressing   Problem: Tissue Perfusion: Goal: Adequacy of tissue perfusion will improve Outcome: Progressing   Problem: Education: Goal: Knowledge of General Education information will improve Description: Including pain rating scale, medication(s)/side effects and non-pharmacologic comfort measures Outcome: Progressing   Problem: Health Behavior/Discharge Planning: Goal: Ability to manage health-related needs will improve Outcome: Progressing   Problem: Clinical Measurements: Goal: Ability to maintain clinical measurements within normal limits will improve Outcome: Progressing Goal: Will remain free from infection Outcome: Progressing Goal: Diagnostic test results will improve Outcome: Progressing Goal: Respiratory complications will improve Outcome: Progressing Goal: Cardiovascular complication will  be avoided Outcome: Progressing   Problem: Activity: Goal: Risk for activity intolerance will decrease Outcome: Progressing   Problem: Nutrition: Goal: Adequate nutrition will be maintained Outcome: Progressing   Problem: Coping: Goal: Level of anxiety will decrease Outcome: Progressing   Problem: Elimination: Goal: Will not experience complications related to bowel motility Outcome: Progressing Goal: Will not experience complications related to urinary retention Outcome: Progressing   Problem: Pain Management: Goal: General experience of comfort will improve Outcome: Progressing   Problem: Safety: Goal: Ability to remain free from injury will improve Outcome: Progressing   Problem: Skin Integrity: Goal: Risk for impaired skin integrity will decrease Outcome: Progressing   Problem: Safety: Goal: Non-violent Restraint(s) Outcome: Progressing

## 2023-04-08 NOTE — Progress Notes (Signed)
Physical Therapy Treatment Patient Details Name: [name]. MRN: [id number] DOB: 07/22/1955 Today's Date: 04/08/2023   History of Present Illness 67yo male pt admitted for hyperkalemia and perinephric abscess. PMH includes Afib, asthma, CHF, COPD, CAD, depression, PVD, and esophageal cancer. Currently with PEG tube placed. Of note B LE BKA.    PT Comments  Pt seen for PT tx with pt agreeable. Pt confused throughout session, follows simple commands with extra time. Pt tolerated sitting EOB ~7 minutes with BUE support, engaged in ~3 reaches outside of BOS with 1UE & other UE supported with focus on seated balance; pt declined participating in grooming tasks while sitting EOB. Pt assisted back to bed & rolled L<>R with +2 assist to allow therapists to perform peri hygiene 2/2 incontinent loose BM. Will continue to follow pt acutely & progress mobility as able.    If plan is discharge home, recommend the following: Two people to help with walking and/or transfers;Assistance with cooking/housework;Direct supervision/assist for medications management;Direct supervision/assist for financial management;Assist for transportation;Help with stairs or ramp for entrance;Supervision due to cognitive status;A lot of help with bathing/dressing/bathroom   Can travel by private vehicle     No  Equipment Recommendations  Hospital bed;Hoyer lift    Recommendations for Other Services       Precautions / Restrictions Precautions Precautions: Fall Precaution Comments: BLE AKA, no prosthetics in room Restrictions Weight Bearing Restrictions: No     Mobility  Bed Mobility Overal bed mobility: Needs Assistance Bed Mobility: Supine to Sit Rolling: +2 for physical assistance, Max assist   Supine to sit: Mod assist, Used rails, HOB elevated Sit to supine: Min assist, HOB elevated, Used rails   General bed mobility comments: extra time to upright trunk to sitting EOB    Transfers                         Ambulation/Gait                   Stairs             Wheelchair Mobility     Tilt Bed    Modified Rankin (Stroke Patients Only)       Balance Overall balance assessment: Needs assistance Sitting-balance support: Feet unsupported, No upper extremity supported, Single extremity supported Sitting balance-Leahy Scale: Fair Sitting balance - Comments: close supervision static sitting                                    Cognition Arousal: Alert Behavior During Therapy: WFL for tasks assessed/performed, Anxious Overall Cognitive Status: Impaired/Different from baseline Area of Impairment: Orientation, Attention, Memory, Following commands, Safety/judgement, Awareness, Problem solving                 Orientation Level: Disoriented to, Time, Situation   Memory: Decreased short-term memory Following Commands: Follows one step commands inconsistently, Follows one step commands with increased time Safety/Judgement: Decreased awareness of safety, Decreased awareness of deficits Awareness: Intellectual Problem Solving: Requires verbal cues, Requires tactile cues, Slow processing General Comments: confused, speaking as if he's in war, looking for items someone took, believing he's seeing wife's reflection in mirror but wife not present for session        Exercises      General Comments        Pertinent Vitals/Pain Pain Assessment Pain Assessment: Faces Faces Pain Scale:  No hurt    Home Living                          Prior Function            PT Goals (current goals can now be found in the care plan section) Acute Rehab PT Goals Patient Stated Goal: Get better PT Goal Formulation: With patient Time For Goal Achievement: 04/13/23 Potential to Achieve Goals: Fair Additional Goals Additional Goal #1: Pt will propel w/c x 150 ft with mod I to increase independence with mobility. Progress towards PT  goals: Progressing toward goals    Frequency    Min 1X/week      PT Plan      Co-evaluation              AM-PAC PT "6 Clicks" Mobility   Outcome Measure  Help needed turning from your back to your side while in a flat bed without using bedrails?: A Lot Help needed moving from lying on your back to sitting on the side of a flat bed without using bedrails?: Total Help needed moving to and from a bed to a chair (including a wheelchair)?: Total Help needed standing up from a chair using your arms (e.g., wheelchair or bedside chair)?: Total Help needed to walk in hospital room?: Total Help needed climbing 3-5 steps with a railing? : Total 6 Click Score: 7    End of Session   Activity Tolerance: Patient tolerated treatment well Patient left: in bed;with call bell/phone within reach;with bed alarm set Nurse Communication: Mobility status PT Visit Diagnosis: Muscle weakness (generalized) (M62.81);Other abnormalities of gait and mobility (R26.89)     Time: 1610-9604 PT Time Calculation (min) (ACUTE ONLY): 23 min  Charges:    $Therapeutic Activity: 23-37 mins PT General Charges $$ ACUTE PT VISIT: 1 Visit                     Aleda Grana, PT, DPT 04/08/23, 2:19 PM   Sandi Mariscal 04/08/2023, 2:18 PM

## 2023-04-09 ENCOUNTER — Telehealth: Payer: Self-pay | Admitting: *Deleted

## 2023-04-09 ENCOUNTER — Other Ambulatory Visit: Payer: Medicare Other

## 2023-04-09 DIAGNOSIS — I4892 Unspecified atrial flutter: Secondary | ICD-10-CM | POA: Diagnosis not present

## 2023-04-09 LAB — CBC
HCT: 36.6 % — ABNORMAL LOW (ref 39.0–52.0)
Hemoglobin: 12.1 g/dL — ABNORMAL LOW (ref 13.0–17.0)
MCH: 29.2 pg (ref 26.0–34.0)
MCHC: 33.1 g/dL (ref 30.0–36.0)
MCV: 88.4 fL (ref 80.0–100.0)
Platelets: 334 10*3/uL (ref 150–400)
RBC: 4.14 MIL/uL — ABNORMAL LOW (ref 4.22–5.81)
RDW: 14.5 % (ref 11.5–15.5)
WBC: 16.1 10*3/uL — ABNORMAL HIGH (ref 4.0–10.5)
nRBC: 0 % (ref 0.0–0.2)

## 2023-04-09 LAB — BASIC METABOLIC PANEL
Anion gap: 8 (ref 5–15)
BUN: 41 mg/dL — ABNORMAL HIGH (ref 8–23)
CO2: 25 mmol/L (ref 22–32)
Calcium: 8.5 mg/dL — ABNORMAL LOW (ref 8.9–10.3)
Chloride: 99 mmol/L (ref 98–111)
Creatinine, Ser: 1.33 mg/dL — ABNORMAL HIGH (ref 0.61–1.24)
GFR, Estimated: 58 mL/min — ABNORMAL LOW (ref >60)
Glucose, Bld: 174 mg/dL — ABNORMAL HIGH (ref 70–99)
Potassium: 4.8 mmol/L (ref 3.5–5.1)
Sodium: 132 mmol/L — ABNORMAL LOW (ref 135–145)

## 2023-04-09 LAB — URINE CULTURE: Culture: >=100000 — AB

## 2023-04-09 LAB — GLUCOSE, CAPILLARY
Glucose-Capillary: 165 mg/dL — ABNORMAL HIGH (ref 70–99)
Glucose-Capillary: 264 mg/dL — ABNORMAL HIGH (ref 70–99)
Glucose-Capillary: 210 mg/dL — ABNORMAL HIGH (ref 70–99)
Glucose-Capillary: 144 mg/dL — ABNORMAL HIGH (ref 70–99)

## 2023-04-09 MED ORDER — INSULIN GLARGINE-YFGN 100 UNIT/ML ~~LOC~~ SOLN
25.0000 [IU] | Freq: Every day | SUBCUTANEOUS | Status: DC
  Administered 2023-04-09 – 2023-04-18 (×10): 25 [IU] via SUBCUTANEOUS
  Filled 2023-04-09 (×10): qty 0.25

## 2023-04-09 MED ORDER — FREE WATER
200.0000 mL | Freq: Three times a day (TID) | Status: DC
  Administered 2023-04-09 – 2023-04-17 (×33): 200 mL

## 2023-04-09 MED ORDER — SODIUM CHLORIDE 0.9% FLUSH
10.0000 mL | Freq: Two times a day (BID) | INTRAVENOUS | Status: DC
  Administered 2023-04-09 – 2023-04-10 (×2): 10 mL via INTRAVENOUS

## 2023-04-09 MED ORDER — INSULIN GLARGINE-YFGN 100 UNIT/ML ~~LOC~~ SOLN
38.0000 [IU] | Freq: Every day | SUBCUTANEOUS | Status: DC
  Filled 2023-04-09: qty 0.38

## 2023-04-09 MED ORDER — BISACODYL 5 MG PO TBEC: 10.0000 mg | DELAYED_RELEASE_TABLET | Freq: Once | ORAL | Status: DC

## 2023-04-09 MED ORDER — SODIUM ZIRCONIUM CYCLOSILICATE 10 G PO PACK
10.0000 g | PACK | Freq: Every day | ORAL | Status: DC
  Administered 2023-04-09 – 2023-04-12 (×4): 10 g
  Filled 2023-04-09 (×5): qty 1

## 2023-04-09 MED ORDER — FREE WATER: 300.0000 mL | Freq: Three times a day (TID) | Status: DC

## 2023-04-09 MED ORDER — SODIUM CHLORIDE 0.9 % IV BOLUS
1000.0000 mL | Freq: Once | INTRAVENOUS | Status: AC
  Administered 2023-04-09: 1000 mL via INTRAVENOUS

## 2023-04-09 NOTE — Plan of Care (Signed)
  Problem: Fluid Volume: Goal: Ability to maintain a balanced intake and output will improve Outcome: Progressing   Problem: Nutritional: Goal: Maintenance of adequate nutrition will improve Outcome: Progressing   Problem: Tissue Perfusion: Goal: Adequacy of tissue perfusion will improve Outcome: Progressing

## 2023-04-09 NOTE — Progress Notes (Signed)
Occupational Therapy Treatment Patient Details Name: [name]. MRN: [id number] DOB: 07/01/1955 Today's Date: 04/09/2023   History of present illness 67yo male pt admitted for hyperkalemia and perinephric abscess. PMH includes Afib, asthma, CHF, COPD, CAD, depression, PVD, and esophageal cancer. Currently with PEG tube placed. Of note B LE BKA.   OT comments  [name] was seen for OT treatment on this date. Upon arrival to room pt in bed, agreeable to tx. Pt requires MOD A exit bed, initial MIN A for sitting balance improves to SUPERVISION. SETUP seated grooming tasks. MIN A lateral scoot along EOB. No assist to return to supine and scoot higher in bed. Pt making progress toward goals, will continue to follow POC. Discharge recommendation remains appropriate.       If plan is discharge home, recommend the following:  A little help with bathing/dressing/bathroom;Assistance with cooking/housework;Assist for transportation;Help with stairs or ramp for entrance;A lot of help with walking and/or transfers;Assistance with feeding;Direct supervision/assist for medications management;Supervision due to cognitive status   Equipment Recommendations  Hospital bed    Recommendations for Other Services      Precautions / Restrictions Precautions Precautions: Fall Precaution Comments: BLE AKA, no prosthetics in room Restrictions Weight Bearing Restrictions: No       Mobility Bed Mobility Overal bed mobility: Needs Assistance Bed Mobility: Supine to Sit, Sit to Supine     Supine to sit: Mod assist, Used rails Sit to supine: Contact guard assist        Transfers Overall transfer level: Needs assistance   Transfers: Bed to chair/wheelchair/BSC            Lateral/Scoot Transfers: Min assist General transfer comment: along EOB     Balance Overall balance assessment: Needs assistance Sitting-balance support: Feet unsupported, No upper extremity supported, Single  extremity supported Sitting balance-Leahy Scale: Fair                                     ADL either performed or assessed with clinical judgement   ADL Overall ADL's : Needs assistance/impaired                                       General ADL Comments: SETUP + SUPERVISION seated grooming tasks.      Cognition Arousal: Alert Behavior During Therapy: WFL for tasks assessed/performed, Anxious Overall Cognitive Status: Impaired/Different from baseline                                 General Comments: oriented to self and location, asking about going to courthouse to deal with chemotherapy (unable to explain further)                   Pertinent Vitals/ Pain       Pain Assessment Pain Assessment: No/denies pain   Frequency  Min 1X/week        Progress Toward Goals  OT Goals(current goals can now be found in the care plan section)  Progress towards OT goals: Progressing toward goals  Acute Rehab OT Goals Potential to Achieve Goals: Good ADL Goals Pt Will Perform Lower Body Dressing: with min assist;sitting/lateral leans;bed level Pt Will Transfer to Toilet: with transfer board;bedside commode;with mod assist Pt Will Perform Toileting -  Clothing Manipulation and hygiene: with min assist;sitting/lateral leans Additional ADL Goal #1: Pt will tolerate grooming tasks seated EOB wiht setup and supv>32min.  Plan      Co-evaluation                 AM-PAC OT "6 Clicks" Daily Activity     Outcome Measure   Help from another person eating meals?: None Help from another person taking care of personal grooming?: A Little Help from another person toileting, which includes using toliet, bedpan, or urinal?: A Lot Help from another person bathing (including washing, rinsing, drying)?: A Lot Help from another person to put on and taking off regular upper body clothing?: None Help from another person to put on and taking  off regular lower body clothing?: A Lot 6 Click Score: 17    End of Session    OT Visit Diagnosis: Muscle weakness (generalized) (M62.81);Other symptoms and signs involving cognitive function   Activity Tolerance Patient tolerated treatment well   Patient Left in bed;with call bell/phone within reach;with bed alarm set   Nurse Communication          Time: 4166-0630 OT Time Calculation (min): 24 min  Charges: OT General Charges $OT Visit: 1 Visit OT Treatments $Self Care/Home Management : 8-22 mins $Therapeutic Activity: 8-22 mins  Kathie Dike, M.S. OTR/L  04/09/23, 12:39 PM  ascom [phone]

## 2023-04-09 NOTE — Telephone Encounter (Signed)
Wife asking to speak with Dr B regarding patient treatment and the fact that it is being suggested that he go to Rehab post discharge and will not be able to get Treatment while there. Please call her to discuss. [phone]

## 2023-04-09 NOTE — TOC Progression Note (Signed)
Transition of Care (TOC) - Progression Note    Patient Details  Name: [name]. MRN: [id number] Date of Birth: [birth year]  Transition of Care [facility]) CM/SW Contact  Truddie Hidden, RN Phone Number: 04/09/2023, 1:51 PM  Clinical Narrative:    Spoke with patient at bedside regarding therapy's recommendation for SNF. He is agreeable to SNF but request to speak with his wife.   RNCM spoke with patient's wife regarding SNF. Vs HH. Patient wife does not have a preference of a SNF. She does not want [facility] included in the search. If patient decides to go home patient wife would like HH via [place].         Expected Discharge Plan and Services                                               Social Determinants of Health (SDOH) Interventions SDOH Screenings   Food Insecurity: No Food Insecurity (03/29/2023)  Housing: Low Risk  (03/29/2023)  Transportation Needs: No Transportation Needs (03/29/2023)  Utilities: Not At Risk (03/29/2023)  Depression (PHQ2-9): Low Risk  (11/21/2021)  Financial Resource Strain: Low Risk  (09/28/2022)   Received from [facility], [facility]  Tobacco Use: Medium Risk (03/27/2023)    Readmission Risk Interventions    02/13/2022    4:57 PM 09/29/2021   11:12 AM  Readmission Risk Prevention Plan  Transportation Screening Complete Complete  PCP or Specialist Appt within 3-5 Days  Complete  HRI or Home Care Consult  Complete  Social Work Consult for Recovery Care Planning/Counseling  Complete  Palliative Care Screening  Not Applicable  Medication Review Oceanographer) Complete Complete  PCP or Specialist appointment within 3-5 days of discharge Complete   HRI or Home Care Consult Complete   SW Recovery Care/Counseling Consult Complete   Palliative Care Screening Not Applicable   Skilled Nursing Facility Not Applicable

## 2023-04-09 NOTE — Progress Notes (Signed)
Nutrition Follow-up  DOCUMENTATION CODES:   Obesity unspecified, Non-severe (moderate) malnutrition in context of chronic illness  INTERVENTION:   -TF via g-tube:    356 ml Glucerna 1.5 4 times daily   115 ml free water flush before and after each feeding administration   Tube feeding regimen provides 2136 kcal (100% of needs), 118 grams of protein, and 1080 ml of H2O.  Total free water: 2000 ml daily   -Allow ice chips for dry mouth per discussion with MD -Continue 1 packet Banatrol BID   NUTRITION DIAGNOSIS:   Moderate Malnutrition related to chronic illness (esopahgeal mass, likely malginant) as evidenced by mild fat depletion, mild muscle depletion.  Ongoing  GOAL:   Patient will meet greater than or equal to 90% of their needs  Met with TF  MONITOR:   TF tolerance  REASON FOR ASSESSMENT:   Consult Enteral/tube feeding initiation and management  ASSESSMENT:   Pt with medical history significant of atrial fibrillation, asthma, congestive heart failure, COPD, coronary artery disease, depression, diabetes mellitus, gout, hyperlipidemia, hypertension, hypogonadism in male, morbid obesity, peripheral vascular disease, OSA on CPAP at night, esophageal cancer status post chemo and G-tube placement.  11/15- s/p EGD- food removal in lower third of esophagus, malignant appearing esophageal stenosis, completely obstructing, esophageal tumor in lowe third of esophagus (biopsied)  11/18- s/p Stamm gastrostomy  11/30- rectal tube placed 12/5- banatrol added for diarrhea 12/6- rectal tube removed by pt  Reviewed I/O's: +2.1 L x 24 hours and -1.9 L since admission  Per GI, flex sig could be considered in 1-2 days if symptoms not improving. GI also recommending ID consult if pt does not improve. Per GI, diarrhea seems to be improving. Banatrol supplement was added back on 04/08/23 in attempt to help with diarrhea.   Pt receiving nursing care at time of visit. Pt unable to  provide history at this time. No family at bedside.   Pt continues to tolerate TF well. RD has been in close contact with wife to discuss pt progress, including home TF regimen. Care order has been placed to help assist with TF education and TOC consult was placed to help with home TF needs.   Reviewed wt hx; pt has experienced a 6% wt loss over the past week. Noted pt -1.9 L since admission. Suspect some wt loss is also related to losses from diarrhea.   Palliaitve care following for goals of care; plan for outpatient palliative care referral.   Medications reviewed and include vitamin C, vitamin D3, klonopin, vitamin B-12, cardizem, ferrous sulfate, amiodarone, and lopressor.   Labs reviewed: CBGS: 165-259 (inpatient orders for glycemic control are 0-5 units insulin aspart daily at bedtime, 0-9 units insulin aspart TID with meals, and 35 units insulin glargine-yfgn daily).    Diet Order:   Diet Order             Diet NPO time specified Except for: Ice Chips  Diet effective now                   EDUCATION NEEDS:   Education needs have been addressed  Skin:  Skin Assessment: Reviewed RN Assessment  Last BM:  04/08/23 (type 7)  Height:   Ht Readings from Last 1 Encounters:  04/02/23 5\' 5"  (1.651 m)    Weight:   Wt Readings from Last 1 Encounters:  04/08/23 122.1 kg    Ideal Body Weight:  75.2 kg (adjusted for bilateral BKAs)  BMI:  Body mass  index is 44.79 kg/m.  Estimated Nutritional Needs:   Kcal:  8657-8469  Protein:  115-130 grams  Fluid:  > 2 L    Levada Schilling, RD, LDN, CDCES Registered Dietitian III Certified Diabetes Care and Education Specialist Please refer to [facility] for RD and/or RD on-call/weekend/after hours pager

## 2023-04-09 NOTE — H&P (View-Only) (Signed)
[facility] Gastro[facility]ology Inpatient Progress Note    Subjective: Patient seen for f/u diarrhea. Patient says "feel ok" but nursing and attending physician says patient appears to be having watery bm's after each Tube feeding, which in most cases, is normal. No abdominal pain or fever. Some increase in WBC.  Objective: Vital signs in last 24 hours: Temp:  [98.4 F (36.9 C)-98.5 F (36.9 C)] 98.5 F (36.9 C) (12/10 1300) Pulse Rate:  [111-118] 112 (12/10 1317) Resp:  [7-20] 16 (12/10 1300) BP: (72-121)/(46-78) 103/68 (12/10 1317) SpO2:  [94 %-97 %] 95 % (12/10 1300) Blood pressure 103/68, pulse (!) 112, temperature 98.5 F (36.9 C), temperature source Oral, resp. rate 16, height 5\' 5"  (1.651 m), weight 122.1 kg, SpO2 95%.    Intake/Output from previous day: 12/09 0701 - 12/10 0700 In: 2149.9 [I.V.:331.9; NG/GT:1818] Out: -   Intake/Output this shift: Total I/O In: 1162 [NG/GT:1162] Out: -    Gen: NAD. Appears comfortable.  HEENT: [place]/AT. PERRLA. Normal external ear exam.  Chest: CTA, no wheezes.  CV: RR nl S1, S2. No gallops.  Abd: soft, obese, nt, nd. BS+  Ext: no edema. Pulses 2+ Bilateral BKA's.  Neuro: Alert and oriented. Judgement appears normal. Nonfocal.   Lab Results: Results for orders placed or performed during the hospital encounter of 03/28/23 (from the past 24 hour(s))  Glucose, capillary     Status: Abnormal   Collection Time: 04/08/23  5:15 PM  Result Value Ref Range   Glucose-Capillary 214 (H) 70 - 99 mg/dL  Glucose, capillary     Status: Abnormal   Collection Time: 04/08/23  9:04 PM  Result Value Ref Range   Glucose-Capillary 259 (H) 70 - 99 mg/dL  Glucose, capillary     Status: Abnormal   Collection Time: 04/08/23  9:25 PM  Result Value Ref Range   Glucose-Capillary 252 (H) 70 - 99 mg/dL  CBC     Status: Abnormal   Collection Time: 04/09/23  4:57 AM  Result Value Ref Range   WBC 16.1 (H) 4.0 - 10.5 K/uL   RBC 4.14 (L) 4.22 - 5.81  MIL/uL   Hemoglobin 12.1 (L) 13.0 - 17.0 g/dL   HCT 56.3 (L) 87.5 - 64.3 %   MCV 88.4 80.0 - 100.0 fL   MCH 29.2 26.0 - 34.0 pg   MCHC 33.1 30.0 - 36.0 g/dL   RDW 32.9 51.8 - 84.1 %   Platelets 334 150 - 400 K/uL   nRBC 0.0 0.0 - 0.2 %  Basic metabolic panel     Status: Abnormal   Collection Time: 04/09/23  4:57 AM  Result Value Ref Range   Sodium 132 (L) 135 - 145 mmol/L   Potassium 4.8 3.5 - 5.1 mmol/L   Chloride 99 98 - 111 mmol/L   CO2 25 22 - 32 mmol/L   Glucose, Bld 174 (H) 70 - 99 mg/dL   BUN 41 (H) 8 - 23 mg/dL   Creatinine, Ser 6.60 (H) 0.61 - 1.24 mg/dL   Calcium 8.5 (L) 8.9 - 10.3 mg/dL   GFR, Estimated 58 (L) >60 mL/min   Anion gap 8 5 - 15  Glucose, capillary     Status: Abnormal   Collection Time: 04/09/23  8:27 AM  Result Value Ref Range   Glucose-Capillary 165 (H) 70 - 99 mg/dL  Glucose, capillary     Status: Abnormal   Collection Time: 04/09/23 12:25 PM  Result Value Ref Range   Glucose-Capillary 264 (H) 70 - 99  mg/dL     Recent Labs    16/10/96 0445 04/08/23 0433 04/09/23 0457  WBC 12.6* 16.4* 16.1*  HGB 12.2* 11.8* 12.1*  HCT 37.1* 36.1* 36.6*  PLT 351 355 334   BMET Recent Labs    04/07/23 0445 04/08/23 0433 04/09/23 0457  NA 132* 133* 132*  K 5.3* 4.9 4.8  CL 99 99 99  CO2 23 25 25   GLUCOSE 171* 190* 174*  BUN 32* 36* 41*  CREATININE 1.14 1.25* 1.33*  CALCIUM 8.8* 8.7* 8.5*   LFT No results for input(s): "PROT", "ALBUMIN", "AST", "ALT", "ALKPHOS", "BILITOT", "BILIDIR", "IBILI" in the last 72 hours. PT/INR No results for input(s): "LABPROT", "INR" in the last 72 hours. Hepatitis Panel No results for input(s): "HEPBSAG", "HCVAB", "HEPAIGM", "HEPBIGM" in the last 72 hours. C-Diff No results for input(s): "CDIFFTOX" in the last 72 hours. No results for input(s): "CDIFFPCR" in the last 72 hours.   Studies/Results: No results found.  Scheduled Inpatient Medications:    apixaban  5 mg Per Tube BID   ascorbic acid  500 mg Per Tube  Daily   aspirin  81 mg Per Tube Daily   Chlorhexidine Gluconate Cloth  6 each Topical Daily   cholecalciferol  2,000 Units Per Tube Daily   clonazePAM  0.5 mg Per Tube BID   cyanocobalamin  10,000 mcg Per Tube Daily   diltiazem  60 mg Oral Q6H   ezetimibe  10 mg Per Tube Daily   feeding supplement (GLUCERNA 1.5 CAL)  356 mL Per Tube TID PC & HS   ferrous sulfate  300 mg Per Tube Daily   fiber supplement (BANATROL TF)  60 mL Per Tube BID   free water  200 mL Per Tube TID PC & HS   insulin aspart  0-5 Units Subcutaneous QHS   insulin aspart  0-9 Units Subcutaneous TID WC   insulin glargine-yfgn  38 Units Subcutaneous QHS   levothyroxine  25 mcg Per Tube Q0600   metoprolol tartrate  25 mg Per Tube Q6H   multivitamin with minerals  1 tablet Oral Daily   mupirocin ointment  1 Application Nasal BID   pramipexole  2 mg Per Tube QHS   primidone  250 mg Per Tube BID   sodium zirconium cyclosilicate  10 g Per Tube Daily   vancomycin  500 mg Per Tube BID    Continuous Inpatient Infusions:    amiodarone 30 mg/hr (04/09/23 1316)    PRN Inpatient Medications:  acetaminophen, haloperidol, ipratropium-albuterol, ondansetron, mouth rinse  Miscellaneous: [name]  Assessment:  Clostridioides diarrhea - s/p 10 days of Dificid, on day 3 of Vancomycin. Should complete 10-14 days of vancomycin. Persistent diarrhea - infectious vs. Inflammatory vs. Dietary etiology (tube feeds). AKI  Plan:  Plan diagnostic flex sig tomorrow to evaluate diarrhea and r/o inflammatory etiology. The patient understands the nature of the planned procedure, indications, risks, alternatives and potential complications including but not limited to bleeding, infection, perforation, damage to internal organs and possible oversedation/side effects from anesthesia. The patient agrees and gives consent to proceed.  Please refer to procedure notes for findings, recommendations and patient disposition/instructions.  Docia Klar K.  Norma Fredrickson, M.D. 04/09/2023, 2:35 PM

## 2023-04-09 NOTE — Plan of Care (Signed)
  Problem: Clinical Measurements: Goal: Respiratory complications will improve Outcome: Progressing Goal: Cardiovascular complication will be avoided Outcome: Progressing   Problem: Coping: Goal: Level of anxiety will decrease Outcome: Progressing   

## 2023-04-09 NOTE — Progress Notes (Signed)
[facility] Gastroenterology Inpatient Progress Note    Subjective: Patient seen for f/u diarrhea. Patient says "feel ok" but nursing and attending physician says patient appears to be having watery bm's after each Tube feeding, which in most cases, is normal. No abdominal pain or fever. Some increase in WBC.  Objective: Vital signs in last 24 hours: Temp:  [98.4 F (36.9 C)-98.5 F (36.9 C)] 98.5 F (36.9 C) (12/10 1300) Pulse Rate:  [111-118] 112 (12/10 1317) Resp:  [7-20] 16 (12/10 1300) BP: (72-121)/(46-78) 103/68 (12/10 1317) SpO2:  [94 %-97 %] 95 % (12/10 1300) Blood pressure 103/68, pulse (!) 112, temperature 98.5 F (36.9 C), temperature source Oral, resp. rate 16, height 5\' 5"  (1.651 m), weight 122.1 kg, SpO2 95%.    Intake/Output from previous day: 12/09 0701 - 12/10 0700 In: 2149.9 [I.V.:331.9; NG/GT:1818] Out: -   Intake/Output this shift: Total I/O In: 1162 [NG/GT:1162] Out: -    Gen: NAD. Appears comfortable.  HEENT: [place]/AT. PERRLA. Normal external ear exam.  Chest: CTA, no wheezes.  CV: RR nl S1, S2. No gallops.  Abd: soft, obese, nt, nd. BS+  Ext: no edema. Pulses 2+ Bilateral BKA's.  Neuro: Alert and oriented. Judgement appears normal. Nonfocal.   Lab Results: Results for orders placed or performed during the hospital encounter of 03/28/23 (from the past 24 hour(s))  Glucose, capillary     Status: Abnormal   Collection Time: 04/08/23  5:15 PM  Result Value Ref Range   Glucose-Capillary 214 (H) 70 - 99 mg/dL  Glucose, capillary     Status: Abnormal   Collection Time: 04/08/23  9:04 PM  Result Value Ref Range   Glucose-Capillary 259 (H) 70 - 99 mg/dL  Glucose, capillary     Status: Abnormal   Collection Time: 04/08/23  9:25 PM  Result Value Ref Range   Glucose-Capillary 252 (H) 70 - 99 mg/dL  CBC     Status: Abnormal   Collection Time: 04/09/23  4:57 AM  Result Value Ref Range   WBC 16.1 (H) 4.0 - 10.5 K/uL   RBC 4.14 (L) 4.22 - 5.81  MIL/uL   Hemoglobin 12.1 (L) 13.0 - 17.0 g/dL   HCT 56.3 (L) 87.5 - 64.3 %   MCV 88.4 80.0 - 100.0 fL   MCH 29.2 26.0 - 34.0 pg   MCHC 33.1 30.0 - 36.0 g/dL   RDW 32.9 51.8 - 84.1 %   Platelets 334 150 - 400 K/uL   nRBC 0.0 0.0 - 0.2 %  Basic metabolic panel     Status: Abnormal   Collection Time: 04/09/23  4:57 AM  Result Value Ref Range   Sodium 132 (L) 135 - 145 mmol/L   Potassium 4.8 3.5 - 5.1 mmol/L   Chloride 99 98 - 111 mmol/L   CO2 25 22 - 32 mmol/L   Glucose, Bld 174 (H) 70 - 99 mg/dL   BUN 41 (H) 8 - 23 mg/dL   Creatinine, Ser 6.60 (H) 0.61 - 1.24 mg/dL   Calcium 8.5 (L) 8.9 - 10.3 mg/dL   GFR, Estimated 58 (L) >60 mL/min   Anion gap 8 5 - 15  Glucose, capillary     Status: Abnormal   Collection Time: 04/09/23  8:27 AM  Result Value Ref Range   Glucose-Capillary 165 (H) 70 - 99 mg/dL  Glucose, capillary     Status: Abnormal   Collection Time: 04/09/23 12:25 PM  Result Value Ref Range   Glucose-Capillary 264 (H) 70 - 99  mg/dL     Recent Labs    16/10/96 0445 04/08/23 0433 04/09/23 0457  WBC 12.6* 16.4* 16.1*  HGB 12.2* 11.8* 12.1*  HCT 37.1* 36.1* 36.6*  PLT 351 355 334   BMET Recent Labs    04/07/23 0445 04/08/23 0433 04/09/23 0457  NA 132* 133* 132*  K 5.3* 4.9 4.8  CL 99 99 99  CO2 23 25 25   GLUCOSE 171* 190* 174*  BUN 32* 36* 41*  CREATININE 1.14 1.25* 1.33*  CALCIUM 8.8* 8.7* 8.5*   LFT No results for input(s): "PROT", "ALBUMIN", "AST", "ALT", "ALKPHOS", "BILITOT", "BILIDIR", "IBILI" in the last 72 hours. PT/INR No results for input(s): "LABPROT", "INR" in the last 72 hours. Hepatitis Panel No results for input(s): "HEPBSAG", "HCVAB", "HEPAIGM", "HEPBIGM" in the last 72 hours. C-Diff No results for input(s): "CDIFFTOX" in the last 72 hours. No results for input(s): "CDIFFPCR" in the last 72 hours.   Studies/Results: No results found.  Scheduled Inpatient Medications:    apixaban  5 mg Per Tube BID   ascorbic acid  500 mg Per Tube  Daily   aspirin  81 mg Per Tube Daily   Chlorhexidine Gluconate Cloth  6 each Topical Daily   cholecalciferol  2,000 Units Per Tube Daily   clonazePAM  0.5 mg Per Tube BID   cyanocobalamin  10,000 mcg Per Tube Daily   diltiazem  60 mg Oral Q6H   ezetimibe  10 mg Per Tube Daily   feeding supplement (GLUCERNA 1.5 CAL)  356 mL Per Tube TID PC & HS   ferrous sulfate  300 mg Per Tube Daily   fiber supplement (BANATROL TF)  60 mL Per Tube BID   free water  200 mL Per Tube TID PC & HS   insulin aspart  0-5 Units Subcutaneous QHS   insulin aspart  0-9 Units Subcutaneous TID WC   insulin glargine-yfgn  38 Units Subcutaneous QHS   levothyroxine  25 mcg Per Tube Q0600   metoprolol tartrate  25 mg Per Tube Q6H   multivitamin with minerals  1 tablet Oral Daily   mupirocin ointment  1 Application Nasal BID   pramipexole  2 mg Per Tube QHS   primidone  250 mg Per Tube BID   sodium zirconium cyclosilicate  10 g Per Tube Daily   vancomycin  500 mg Per Tube BID    Continuous Inpatient Infusions:    amiodarone 30 mg/hr (04/09/23 1316)    PRN Inpatient Medications:  acetaminophen, haloperidol, ipratropium-albuterol, ondansetron, mouth rinse  Miscellaneous: [name]  Assessment:  Clostridioides diarrhea - s/p 10 days of Dificid, on day 3 of Vancomycin. Should complete 10-14 days of vancomycin. Persistent diarrhea - infectious vs. Inflammatory vs. Dietary etiology (tube feeds). AKI  Plan:  Plan diagnostic flex sig tomorrow to evaluate diarrhea and r/o inflammatory etiology. The patient understands the nature of the planned procedure, indications, risks, alternatives and potential complications including but not limited to bleeding, infection, perforation, damage to internal organs and possible oversedation/side effects from anesthesia. The patient agrees and gives consent to proceed.  Please refer to procedure notes for findings, recommendations and patient disposition/instructions.  Docia Klar K.  Norma Fredrickson, M.D. 04/09/2023, 2:35 PM

## 2023-04-09 NOTE — Progress Notes (Signed)
Rounding Note    Patient Name: [name]. Date of Encounter: 04/09/2023  [facility] Cardiologist: Lorine Bears, MD   Subjective   Patient was seen on morning rounds. Is somnolent on exam. Remains asymptomatic from a cardiac standpoint. Remains in atrial flutter with rates elevated in 110-120s. Bps remain soft.   Inpatient Medications    Scheduled Meds:  apixaban  5 mg Per Tube BID   ascorbic acid  500 mg Per Tube Daily   aspirin  81 mg Per Tube Daily   Chlorhexidine Gluconate Cloth  6 each Topical Daily   cholecalciferol  2,000 Units Per Tube Daily   clonazePAM  0.5 mg Per Tube BID   cyanocobalamin  10,000 mcg Per Tube Daily   diltiazem  60 mg Oral Q6H   ezetimibe  10 mg Per Tube Daily   feeding supplement (GLUCERNA 1.5 CAL)  356 mL Per Tube TID PC & HS   ferrous sulfate  300 mg Per Tube Daily   fiber supplement (BANATROL TF)  60 mL Per Tube BID   free water  250 mL Per Tube TID PC & HS   insulin aspart  0-5 Units Subcutaneous QHS   insulin aspart  0-9 Units Subcutaneous TID WC   insulin glargine-yfgn  35 Units Subcutaneous QHS   levothyroxine  25 mcg Per Tube Q0600   metoprolol tartrate  25 mg Per Tube Q6H   multivitamin with minerals  1 tablet Oral Daily   mupirocin ointment  1 Application Nasal BID   pramipexole  2 mg Per Tube QHS   primidone  250 mg Per Tube BID   vancomycin  500 mg Per Tube BID   Continuous Infusions:  amiodarone 60 mg/hr (04/09/23 0219)   PRN Meds: acetaminophen, haloperidol, ipratropium-albuterol, ondansetron, mouth rinse   Vital Signs    Vitals:   04/09/23 0100 04/09/23 0200 04/09/23 0300 04/09/23 0506  BP: (!) 72/46 102/67 102/73 105/65  Pulse:    (!) 113  Resp: 20 20 20    Temp:      TempSrc:      SpO2:      Weight:      Height:        Intake/Output Summary (Last 24 hours) at 04/09/2023 0741 Last data filed at 04/08/2023 1900 Gross per 24 hour  Intake 2149.88 ml  Output --  Net 2149.88 ml       04/08/2023    5:00 AM 04/06/2023    4:00 AM 04/05/2023    3:20 AM  Last 3 Weights  Weight (lbs) 269 lb 2.9 oz 272 lb 7.8 oz 279 lb 8.7 oz  Weight (kg) 122.1 kg 123.6 kg 126.8 kg      Telemetry    Atrial flutter rate 110-120s - Personally Reviewed  Physical Exam   GEN: No acute distress.   Neck: No JVD Cardiac: RRR, tachycardic no murmurs, rubs, or gallops.  Respiratory: Clear to auscultation bilaterally. GI: Soft, nontender, non-distended  MS: Bilateral BKA  Labs    High Sensitivity Troponin:  No results for input(s): "TROPONINIHS" in the last 720 hours.   Chemistry Recent Labs  Lab 04/03/23 0142 04/03/23 1602 04/05/23 0506 04/06/23 0649 04/07/23 0445 04/08/23 0433 04/09/23 0457  NA 134*   < > 133*   < > 132* 133* 132*  K 5.2*   < > 4.8   < > 5.3* 4.9 4.8  CL 101   < > 99   < > 99 99 99  CO2 24   < > 25   < > 23 25 25   GLUCOSE 216*   < > 219*   < > 171* 190* 174*  BUN 37*   < > 33*   < > 32* 36* 41*  CREATININE 1.03   < > 1.08   < > 1.14 1.25* 1.33*  CALCIUM 8.2*   < > 8.6*   < > 8.8* 8.7* 8.5*  MG 2.5*  --  2.2  --   --   --   --   GFRNONAA >60   < > >60   < > >60 >60 58*  ANIONGAP 9   < > 9   < > 10 9 8    < > = values in this interval not displayed.    Lipids No results for input(s): "CHOL", "TRIG", "HDL", "LABVLDL", "LDLCALC", "CHOLHDL" in the last 168 hours.  Hematology Recent Labs  Lab 04/07/23 0445 04/08/23 0433 04/09/23 0457  WBC 12.6* 16.4* 16.1*  RBC 4.17* 4.01* 4.14*  HGB 12.2* 11.8* 12.1*  HCT 37.1* 36.1* 36.6*  MCV 89.0 90.0 88.4  MCH 29.3 29.4 29.2  MCHC 32.9 32.7 33.1  RDW 14.2 14.4 14.5  PLT 351 355 334   Thyroid No results for input(s): "TSH", "FREET4" in the last 168 hours.  BNPNo results for input(s): "BNP", "PROBNP" in the last 168 hours.  DDimer No results for input(s): "DDIMER" in the last 168 hours.    Patient Profile     67 y.o. male with a hx of frequent falls, PAD s/p b/l BKA, DM2, afib RVR with prior cardioversion,  moderate LVH, CAD with CABG in 2006, HTN, esophageal cancer s/p chemo and G-tube placement, and CKD who is being seen 04/09/2023 for the further evaluation of aflutter RVR   Assessment & Plan    Atrial flutter with RVR - History of paroxysmal a fib with multiple cardioversions - CHA2DS2-VASc of at least 5  - Continue apixaban - Telemetry shows atrial flutter with elevated rates 110-120s - Not an ideal candidate for TEE given esophageal cancer - Received 2 doses of IV digoxin earlier in admission which failed to improved rates - Amiodarone was increased to 60 mg/h yesterday and had little effect on HR, will decrease back to 30 mg/h - Continue diltiazem 30 mg q6hr and metoprolol tartrate 25 mg q6hr - Soft blood pressures are preventing increase in oral medications - Continue telemetry  Acute metabolic encephalopathy - Esophageal cancer s/p G tube - C diff with profuse diarrhea - AMS with agitation, requires safety observations - Management per IM  CAD s/p CABG/PAD s/p BKA - Denies chest pain - Continue ASA, apixaban, and exetimibe   For questions or updates, please contact [facility] Please consult [email or web address] for contact info under        Signed, Orion Crook, PA-C  04/09/2023, 7:41 AM

## 2023-04-09 NOTE — Progress Notes (Addendum)
[facility]  - [place] [facility]   PATIENT NAME: Bobbye Sapia    MR#:  [id number]  DATE OF BIRTH:  1955-07-30  SUBJECTIVE:  Resting in bed, several large volume watery BMs today, no pain, no dysuria, able to void  VITALS:  Blood pressure 101/66, pulse (!) 111, temperature 98.4 F (36.9 C), temperature source Oral, resp. rate 14, height 5\' 5"  (1.651 m), weight 122.1 kg, SpO2 94%.  PHYSICAL EXAMINATION:   GENERAL:  67 y.o.-year-old patient with no acute distress. obese, chronically ill LUNGS:decreased breath sounds bilaterally, no wheezing CARDIOVASCULAR: S1, S2 normal. tachycardia ABDOMEN: Soft, nontender, nondistended. PEG+ EXTREMITIES: Bilateral amputation stump  NEUROLOGIC: moves all 4. aaox3  LABORATORY PANEL:  CBC Recent Labs  Lab 04/09/23 0457  WBC 16.1*  HGB 12.1*  HCT 36.6*  PLT 334    Chemistries  Recent Labs  Lab 04/05/23 0506 04/06/23 0649 04/09/23 0457  NA 133*   < > 132*  K 4.8   < > 4.8  CL 99   < > 99  CO2 25   < > 25  GLUCOSE 219*   < > 174*  BUN 33*   < > 41*  CREATININE 1.08   < > 1.33*  CALCIUM 8.6*   < > 8.5*  MG 2.2  --   --    < > = values in this interval not displayed.   Assessment and Plan  Jaquon A Elvir Witczak. is a 67 y.o. male with medical history significant of atrial fibrillation, asthma, congestive heart failure, COPD, coronary artery disease, depression, diabetes mellitus, gout, hyperlipidemia, hypertension, hypogonadism in male, morbid obesity, peripheral vascular disease, OSA on CPAP at night, esophageal cancer status post chemo and G-tube placement.  Patient currently being managed for hyperkalemia, severe dehydration and AKI and acute metabolic encephalopathy.   Acute kidney injury secondary to dehydration Initially resolved now with up-trending cr in setting of ongoing diarrhea - will give 1 liter IV fluids today  Delirium Intermittent, delirious this morning got haldol, improving now - haldol  prn  End-of-life care Homero Fellers discussion 12/9 with wife reviewing patient's bedbound status, multiple severe comorbidities, new cancer diagnosis with PEG tube for feeding, his acute medical problems that are proving difficult to treat, and his downward trend that includes 8 admissions in our system over the past 2 years. I shared his long-term prognosis is poor. She tells me "I wish you had never told me that, I don't want to hear any of that right now," so I will refrain from further such discussion.  Bacteriuria Wife convinced AMS 2/2 uti as thinks he has presented this way in the past. Cathed urine specimen from yesterday does have greater than 10^5 klebsiella which he has had in the past. But no fever, cva pain/tenderness, suprapubic pain/tenderness, dysuria, and does have c diff, so I am holding off on treatment for the time being and monitoring closely for development of clinical signs of a uti. Continues to deny uti symptoms  Acute diarrhea secondary to C. difficile infection Continues to have profuse watery BMs multiple times a day -GI consulted 12/8 --s/p 10 days Dificid, vanc added by GI on 12/8 -- fiber discontinued as was colestid -banatrol -GI is planning flex sig tomorrow -- may need to get ID involved     Possible right perinephric hematoma --Described as possible abscess by radiology but per secure chat communication w/ Dr. Apolinar Junes on 12/9, this is a chornic complex hematoma dating back over a year,  nothing that has been drainable thus far, possible underlying renal mass, and in light of significant comorbidities and as he is asymptomatic inpatient evaluation has been deferred, plan is outpatient urology f/u, consider MRI then.  Severe life-threatening hyperkalemia Has required daily lokelma for the past several days - continue daily lokelma for now    Chronic persistent atrial fibrillation with rvr Tachycardia Cardiology now following. Remains in rvr, hemodynamically  stable On apixaban On dilt and metop currently  Amio infusion per cardiology   Urinary retention Foley discontinued , now voiding spontaneously, bladder scan neg, thus appears to be resolved   Chronic diastolic congestive heart failure --Recent EF of 55% on echo obtained on November 2024  COPD --Continue as needed nebulization    Coronary artery disease,  --Continue aspirin therapy   Diabetes mellitus type 2 with hyperglycemia --Continue insulin therapy but will decrease semglee tonight to 25 from 35 given he will be npo for flex sig  Hypothyroidism --on levothyroxine     Nutrition Status: Nutrition Problem: Moderate Malnutrition Etiology: chronic illness (esopahgeal mass, likely malginant) Signs/Symptoms: mild fat depletion, mild muscle depletion Interventions: Tube feeding  Peripheral vascular disease S/p bilateral BKAs --Continue aspirin  --Patient is allergic to statin therapy   OSA on CPAP at night,  --CPAP at night   Esophageal cancer status post chemo and G-tube placement. --pt gets Bolus TF      Advance Care Planning:   full code confirmed with wife 12/6  Consults: Interventional radiology, urology  Family Communication: wife updated telephonically 12/9. No one at bedside today. DVT Prophylaxis :Eliquis Level of care: Progressive Status is: Inpatient Remains inpatient appropriate because: diarrhea persists, ongoing rvr    Silvano Bilis M.D.   [facility]

## 2023-04-09 NOTE — Progress Notes (Addendum)
Spoke with Dr. Norma Fredrickson and MD gave order to discontinue dulcolax, hold tube feedings now and to order another tap water enema for tomorrow morning after the initial one scheduled for 0800. RN made MD aware that patient's wife is at bedside and wants to speak with him regarding procedure scheduled for tomorrow. MD acknowledged. MD also gave order that patient may have meds with water via PEG after midnight but otherwise NPO.

## 2023-04-09 NOTE — NC FL2 (Signed)
[place] MEDICAID FL2 LEVEL OF CARE FORM     IDENTIFICATION  Patient Name: [name]. Birthdate: November 14, 1955 Sex: male Admission Date (Current Location): 03/28/2023  [facility] and [place] Number:  Chiropodist and Address:  [facility], [address] [place], [place] [address]      Provider Number: [id number]  Attending Physician Name and Address:  Kathrynn Running, MD  Relative Name and Phone Number:  Georgann Housekeeper (Spouse)  [phone] (Mobile)    Current Level of Care: Hospital Recommended Level of Care: Skilled Nursing Facility Prior Approval Number:    Date Approved/Denied:   PASRR Number: [id number]  Discharge Plan: Home    Current Diagnoses: Patient Active Problem List   Diagnosis Date Noted   Atrial flutter with rapid ventricular response (HCC) 04/07/2023   Diarrhea 04/05/2023   Perinephric fluid collection 04/05/2023   Persistent atrial fibrillation (HCC) 04/05/2023   Typical atrial flutter (HCC) 04/05/2023   Diarrhea of infectious origin 04/05/2023   Acute hyperkalemia 03/28/2023   Esophageal adenocarcinoma (HCC) 03/15/2023   Dysphagia 03/14/2023   Coronary artery disease 03/14/2023   Type 2 diabetes mellitus with peripheral neuropathy (HCC) 03/14/2023   Status post below-knee amputation of right lower extremity (HCC) 05/31/2022   S/P amputation 05/30/2022   Poorly controlled diabetes mellitus (HCC) 05/30/2022   Acute osteomyelitis of right foot (HCC) 05/19/2022   Wound infection 05/18/2022   Ulcer of right ankle, with fat layer exposed (HCC) 05/18/2022   Non-healing wound of lower extremity, initial encounter 05/18/2022   CAD with Hx of CABG 05/18/2022   Chronic respiratory failure with hypoxia (HCC) 05/18/2022   S/P BKA (below knee amputation), left (HCC) 05/18/2022   COPD (chronic obstructive pulmonary disease) (HCC) 05/18/2022   Perinephric hematoma 03/20/2022   Bacteremia due to Klebsiella  pneumoniae 03/19/2022   Hyperglycemia 03/16/2022   Septic shock (HCC) 03/16/2022   Sepsis secondary to UTI (HCC) 03/15/2022   Acute hypoxemic respiratory failure (HCC) 02/13/2022   Atrial fibrillation with rapid ventricular response (HCC) 02/11/2022   Uncontrolled type 2 diabetes mellitus with hyperglycemia, with long-term current use of insulin (HCC) 02/11/2022   Dyslipidemia 02/11/2022   Hypothyroidism 02/11/2022   Benign prostatic hyperplasia with lower urinary tract symptoms 02/11/2022   Pressure injury of skin of heel 11/04/2021   UTI (urinary tract infection) 10/22/2021   Iron deficiency anemia 10/19/2021   Hyperkalemia 10/14/2021   Acute urinary retention 10/08/2021   Sepsis due to methicillin resistant Staphylococcus aureus (MRSA) (HCC) 10/04/2021   Delirium 10/04/2021   Acute on chronic respiratory failure with hypoxia and hypercapnia (HCC) 10/04/2021   Acute respiratory failure with hypoxia and hypercapnia (HCC) 10/03/2021   OSA (obstructive sleep apnea) 10/03/2021   Malnutrition of moderate degree 09/29/2021   Generalized weakness 09/29/2021   Cellulitis of left foot 08/04/2021   Uncontrolled type 2 diabetes mellitus with hypoglycemia, with long-term current use of insulin (HCC) 08/04/2021   Bilateral lower extremity edema 08/04/2021   Acute osteomyelitis of lower leg, left (HCC) 08/04/2021   Atrial fibrillation, chronic (HCC) 08/04/2021   Swelling of limb 07/18/2021   CHF exacerbation (HCC) 05/08/2021   Diabetes mellitus without complication (HCC)    Atrial fibrillation with RVR (HCC)    Obesity, Class III, BMI 40-49.9 (morbid obesity) (HCC)    Venous stasis ulcer (HCC)    Sinus tachycardia    BPH (benign prostatic hyperplasia)    Chronic anticoagulation    Anasarca    Chronic diastolic CHF (congestive heart failure) (  HCC)    Venous stasis of both lower extremities    Paroxysmal atrial fibrillation (HCC)    Lower extremity cellulitis 01/03/2021   Chronic  osteomyelitis of left foot (HCC) 01/03/2021   Osteomyelitis (HCC) 01/03/2021   Severe sepsis (HCC) 02/24/2020   Acute kidney injury (HCC) 02/24/2020   Bilateral cellulitis of lower leg 02/23/2020   CAD (coronary artery disease) 02/02/2020   RLS (restless legs syndrome) 05/15/2019   Statin myopathy 05/15/2019   Type 2 diabetes mellitus with obesity (HCC) 02/13/2016   Essential hypertension 02/13/2016   PAD (peripheral artery disease) (HCC) 02/13/2016   Pressure injury of skin 01/25/2016   Hypoglycemia associated with diabetes (HCC) 12/27/2013   Long-term insulin use (HCC) 12/27/2013   Microalbuminuria 12/27/2013   B12 deficiency 09/29/2013   Obesity 09/29/2013   CAD (coronary artery disease), autologous vein bypass graft 07/20/2013   Hypersomnia with sleep apnea 07/20/2013   Pure hypercholesterolemia 07/20/2013   Osteomyelitis of ankle or foot 07/20/2011    Orientation RESPIRATION BLADDER Height & Weight     Self, Place  Normal Incontinent Weight: 122.1 kg Height:  5\' 5"  (165.1 cm) (Bilateral BKAs)  BEHAVIORAL SYMPTOMS/MOOD NEUROLOGICAL BOWEL NUTRITION STATUS  Other (Comment) (n/a)  (n/a) Incontinent Feeding tube  AMBULATORY STATUS COMMUNICATION OF NEEDS Skin   Limited Assist Verbally  (Erythema bilateral groin)                       Personal Care Assistance Level of Assistance  Bathing, Feeding, Dressing Bathing Assistance: Limited assistance Feeding assistance: Limited assistance Dressing Assistance: Limited assistance     Functional Limitations Info  Sight          SPECIAL CARE FACTORS FREQUENCY  PT (By licensed PT), OT (By licensed OT)     PT Frequency: Min 2x weekly OT Frequency: Min 2x weekly            Contractures Contractures Info: Not present    Additional Factors Info  Code Status Code Status Info: FULL             Current Medications (04/09/2023):  This is the current hospital active medication list Current Facility-Administered  Medications  Medication Dose Route Frequency Provider Last Rate Last Admin   acetaminophen (TYLENOL) tablet 650 mg  650 mg Oral Q6H PRN Enedina Finner, MD   650 mg at 04/04/23 0706   amiodarone (NEXTERONE PREMIX) 360-4.14 MG/200ML-% (1.8 mg/mL) IV infusion  30 mg/hr Intravenous Continuous [place] Dura L, PA-C 16.67 mL/hr at 04/09/23 1316 30 mg/hr at 04/09/23 1316   apixaban (ELIQUIS) tablet 5 mg  5 mg Per Tube BID Enedina Finner, MD   5 mg at 04/09/23 5409   ascorbic acid (VITAMIN C) tablet 500 mg  500 mg Per Tube Daily Enedina Finner, MD   500 mg at 04/09/23 8119   aspirin chewable tablet 81 mg  81 mg Per Tube Daily Loyce Dys, MD   81 mg at 04/09/23 1478   Chlorhexidine Gluconate Cloth 2 % PADS 6 each  6 each Topical Daily Loyce Dys, MD   6 each at 04/09/23 0800   cholecalciferol (VITAMIN D3) 25 MCG (1000 UNIT) tablet 2,000 Units  2,000 Units Per Tube Daily Enedina Finner, MD   2,000 Units at 04/09/23 2956   clonazePAM (KLONOPIN) tablet 0.5 mg  0.5 mg Per Tube BID Rosezetta Schlatter T, MD   0.5 mg at 04/09/23 2130   cyanocobalamin (VITAMIN B12) tablet 10,000 mcg  10,000 mcg Per  Tube Daily Barrie Folk, RPH   10,000 mcg at 04/09/23 5956   diltiazem (CARDIZEM) tablet 60 mg  60 mg Oral Q6H Antonieta Iba, MD   60 mg at 04/09/23 1317   ezetimibe (ZETIA) tablet 10 mg  10 mg Per Tube Daily Enedina Finner, MD   10 mg at 04/09/23 3875   feeding supplement (GLUCERNA 1.5 CAL) liquid 356 mL  356 mL Per Tube TID PC & HS Rosezetta Schlatter T, MD   356 mL at 04/09/23 1317   ferrous sulfate 220 (44 Fe) MG/5ML solution 300 mg  300 mg Per Tube Daily Enedina Finner, MD   300 mg at 04/09/23 0906   fiber supplement (BANATROL TF) liquid 60 mL  60 mL Per Tube BID Kathrynn Running, MD   60 mL at 04/09/23 0854   free water 200 mL  200 mL Per Tube TID PC & HS Kathrynn Running, MD   200 mL at 04/09/23 1317   haloperidol (HALDOL) tablet 5 mg  5 mg Per Tube Q6H PRN Kathrynn Running, MD   5 mg at 04/09/23 0014   insulin aspart  (novoLOG) injection 0-5 Units  0-5 Units Subcutaneous QHS Rosezetta Schlatter T, MD   3 Units at 04/08/23 2226   insulin aspart (novoLOG) injection 0-9 Units  0-9 Units Subcutaneous TID WC Rosezetta Schlatter T, MD   5 Units at 04/09/23 1316   insulin glargine-yfgn (SEMGLEE) injection 38 Units  38 Units Subcutaneous QHS Wouk, Wilfred Curtis, MD       ipratropium-albuterol (DUONEB) 0.5-2.5 (3) MG/3ML nebulizer solution 3 mL  3 mL Nebulization Q4H PRN Loyce Dys, MD       levothyroxine (SYNTHROID) tablet 25 mcg  25 mcg Per Tube [id number] Rosezetta Schlatter T, MD   25 mcg at 04/09/23 0506   metoprolol tartrate (LOPRESSOR) tablet 25 mg  25 mg Per Tube Q6H Jodelle Red, MD   25 mg at 04/09/23 1317   multivitamin with minerals tablet 1 tablet  1 tablet Oral Daily Enedina Finner, MD   1 tablet at 04/09/23 9518   mupirocin ointment (BACTROBAN) 2 % 1 Application  1 Application Nasal BID Kathrynn Running, MD   1 Application at 04/09/23 8416   ondansetron (ZOFRAN-ODT) disintegrating tablet 4 mg  4 mg Oral Q6H PRN Kathrynn Running, MD   4 mg at 04/04/23 6063   Oral care mouth rinse  15 mL Mouth Rinse PRN Wouk, Wilfred Curtis, MD       pramipexole (MIRAPEX) tablet 2 mg  2 mg Per Tube QHS Rosezetta Schlatter T, MD   2 mg at 04/08/23 2226   primidone (MYSOLINE) tablet 250 mg  250 mg Per Tube BID Enedina Finner, MD   250 mg at 04/09/23 0160   sodium zirconium cyclosilicate (LOKELMA) packet 10 g  10 g Per Tube Daily Wouk, Wilfred Curtis, MD       vancomycin (VANCOCIN) 50 mg/mL oral solution SOLN 500 mg  500 mg Per Tube BID Wylie Hail M, PA-C   500 mg at 04/09/23 1093     Discharge Medications: Please see discharge summary for a list of discharge medications.  Relevant Imaging Results:  Relevant Lab Results:   Additional Information SS# [id number]  Truddie Hidden, RN

## 2023-04-10 ENCOUNTER — Encounter: Payer: Self-pay | Admitting: Internal Medicine

## 2023-04-10 ENCOUNTER — Inpatient Hospital Stay: Payer: Medicare Other | Admitting: Certified Registered"

## 2023-04-10 ENCOUNTER — Ambulatory Visit: Payer: Medicare Other

## 2023-04-10 ENCOUNTER — Encounter
Admission: EM | Disposition: A | Discharge status: Skilled Nursing Facility | Payer: Self-pay | Source: Home / Self Care | Attending: Obstetrics and Gynecology

## 2023-04-10 DIAGNOSIS — R410 Disorientation, unspecified: Secondary | ICD-10-CM | POA: Diagnosis not present

## 2023-04-10 DIAGNOSIS — I4892 Unspecified atrial flutter: Secondary | ICD-10-CM | POA: Diagnosis not present

## 2023-04-10 DIAGNOSIS — E875 Hyperkalemia: Secondary | ICD-10-CM | POA: Diagnosis not present

## 2023-04-10 DIAGNOSIS — A09 Infectious gastroenteritis and colitis, unspecified: Secondary | ICD-10-CM | POA: Diagnosis not present

## 2023-04-10 HISTORY — PX: FLEXIBLE SIGMOIDOSCOPY: SHX5431

## 2023-04-10 HISTORY — PX: BIOPSY: SHX5522

## 2023-04-10 LAB — BASIC METABOLIC PANEL
Anion gap: 6 (ref 5–15)
BUN: 39 mg/dL — ABNORMAL HIGH (ref 8–23)
CO2: 25 mmol/L (ref 22–32)
Calcium: 8.2 mg/dL — ABNORMAL LOW (ref 8.9–10.3)
Chloride: 101 mmol/L (ref 98–111)
Creatinine, Ser: 1.25 mg/dL — ABNORMAL HIGH (ref 0.61–1.24)
GFR, Estimated: >60 mL/min (ref >60)
Glucose, Bld: 135 mg/dL — ABNORMAL HIGH (ref 70–99)
Potassium: 4.5 mmol/L (ref 3.5–5.1)
Sodium: 132 mmol/L — ABNORMAL LOW (ref 135–145)

## 2023-04-10 LAB — CBC
HCT: 35.3 % — ABNORMAL LOW (ref 39.0–52.0)
Hemoglobin: 11.6 g/dL — ABNORMAL LOW (ref 13.0–17.0)
MCH: 30.1 pg (ref 26.0–34.0)
MCHC: 32.9 g/dL (ref 30.0–36.0)
MCV: 91.5 fL (ref 80.0–100.0)
Platelets: 303 10*3/uL (ref 150–400)
RBC: 3.86 MIL/uL — ABNORMAL LOW (ref 4.22–5.81)
RDW: 14.7 % (ref 11.5–15.5)
WBC: 14.4 10*3/uL — ABNORMAL HIGH (ref 4.0–10.5)
nRBC: 0 % (ref 0.0–0.2)

## 2023-04-10 LAB — GLUCOSE, CAPILLARY
Glucose-Capillary: 149 mg/dL — ABNORMAL HIGH (ref 70–99)
Glucose-Capillary: 158 mg/dL — ABNORMAL HIGH (ref 70–99)
Glucose-Capillary: 155 mg/dL — ABNORMAL HIGH (ref 70–99)
Glucose-Capillary: 188 mg/dL — ABNORMAL HIGH (ref 70–99)
Glucose-Capillary: 169 mg/dL — ABNORMAL HIGH (ref 70–99)

## 2023-04-10 LAB — PROCALCITONIN: Procalcitonin: 0.19 ng/mL

## 2023-04-10 SURGERY — SIGMOIDOSCOPY, FLEXIBLE
Anesthesia: General

## 2023-04-10 MED ORDER — SODIUM CHLORIDE 0.9 % IV SOLN
1.0000 g | INTRAVENOUS | Status: AC
  Administered 2023-04-10 – 2023-04-14 (×5): 1 g via INTRAVENOUS
  Filled 2023-04-10 (×6): qty 10

## 2023-04-10 MED ORDER — SODIUM CHLORIDE 0.9 % IV SOLN: INTRAVENOUS | Status: DC | PRN

## 2023-04-10 MED ORDER — LIDOCAINE HCL (PF) 2 % IJ SOLN
INTRAMUSCULAR | Status: DC | PRN
  Administered 2023-04-10: 40 mg via INTRADERMAL

## 2023-04-10 MED ORDER — PHENYLEPHRINE HCL (PRESSORS) 10 MG/ML IV SOLN
INTRAVENOUS | Status: DC | PRN
  Administered 2023-04-10: 200 ug via INTRAVENOUS

## 2023-04-10 MED ORDER — PROPOFOL 10 MG/ML IV BOLUS
INTRAVENOUS | Status: DC | PRN
  Administered 2023-04-10: 40 mg via INTRAVENOUS
  Administered 2023-04-10: 50 mg via INTRAVENOUS

## 2023-04-10 NOTE — TOC Progression Note (Signed)
Transition of Care (TOC) - Progression Note    Patient Details  Name: [name]. MRN: [id number] Date of Birth: March 29, 1956  Transition of Care [facility]) CM/SW Contact  Truddie Hidden, RN Phone Number: 04/10/2023, 11:16 AM  Clinical Narrative:    Attempt to speak with patient's wife regarding bed offer for [facility]. She stated she could not hear as she was driving on her way to the hospital. She requested a call back.          Expected Discharge Plan and Services                                               Social Determinants of Health (SDOH) Interventions SDOH Screenings   Food Insecurity: No Food Insecurity (03/29/2023)  Housing: Low Risk  (03/29/2023)  Transportation Needs: No Transportation Needs (03/29/2023)  Utilities: Not At Risk (03/29/2023)  Depression (PHQ2-9): Low Risk  (11/21/2021)  Financial Resource Strain: Low Risk  (09/28/2022)   Received from [facility]  Tobacco Use: Medium Risk (03/27/2023)    Readmission Risk Interventions    02/13/2022    4:57 PM 09/29/2021   11:12 AM  Readmission Risk Prevention Plan  Transportation Screening Complete Complete  PCP or Specialist Appt within 3-5 Days  Complete  HRI or Home Care Consult  Complete  Social Work Consult for Recovery Care Planning/Counseling  Complete  Palliative Care Screening  Not Applicable  Medication Review Oceanographer) Complete Complete  PCP or Specialist appointment within 3-5 days of discharge Complete   HRI or Home Care Consult Complete   SW Recovery Care/Counseling Consult Complete   Palliative Care Screening Not Applicable   Skilled Nursing Facility Not Applicable

## 2023-04-10 NOTE — Op Note (Signed)
[facility] Gastroenterology Patient Name: [name] Procedure Date: 04/10/2023 12:51 PM MRN: [id number] Account #: [id number] Date of Birth: 10-07-1955 Admit Type: Inpatient Age: 67 Room: [facility] ENDO ROOM 2 Gender: Male Note Status: Finalized Instrument Name: Peds Colonoscope [id number] Procedure:             Flexible Sigmoidoscopy Indications:           Diarrhea of presumed infectious origin Providers:             Boykin Nearing. Norma Fredrickson MD, MD Referring MD:          Duane Lope. Judithann Sheen, MD (Referring MD) Medicines:             Propofol per Anesthesia Complications:         No immediate complications. Estimated blood loss: None. Procedure:             Pre-Anesthesia Assessment:                        - The risks and benefits of the procedure and the                         sedation options and risks were discussed with the                         patient. All questions were answered and informed                         consent was obtained.                        - Patient identification and proposed procedure were                         verified prior to the procedure by the nurse. The                         procedure was verified in the procedure room.                        - ASA Grade Assessment: III - A patient with severe                         systemic disease.                        - After reviewing the risks and benefits, the patient                         was deemed in satisfactory condition to undergo the                         procedure.                        After obtaining informed consent, the scope was passed                         under direct vision. The Colonoscope was introduced  through the anus and advanced to the the sigmoid                         colon. The flexible sigmoidoscopy was somewhat                         difficult due to inadequate bowel prep. The quality of                         the bowel  preparation was unsatisfactory. Findings:      The perianal and digital rectal examinations were normal. Pertinent       negatives include normal sphincter tone and no palpable rectal lesions.      Copious quantities of semi-liquid stool was found in the rectum, in the       recto-sigmoid colon and in the sigmoid colon, interfering with       visualization. Lavage of the area was performed using a moderate amount       of sterile water, resulting in incomplete clearance with fair       visualization.      Normal mucosa was found in the sigmoid colon. Biopsies were taken with a       cold forceps for histology.      The retroflexed view of the distal rectum and anal verge was normal and       showed no anal or rectal abnormalities. Impression:            - Preparation of the colon was unsatisfactory.                        - Stool in the rectum, in the recto-sigmoid colon and                         in the sigmoid colon.                        - Normal mucosa in the sigmoid colon. Biopsied. Recommendation:        - Await pathology results. Procedure Code(s):     --- Professional ---                        [phone], Sigmoidoscopy, flexible; with biopsy, single or                         multiple Diagnosis Code(s):     --- Professional ---                        R19.7, Diarrhea, unspecified CPT copyright 2022 American Medical Association. All rights reserved. The codes documented in this report are preliminary and upon coder review may  be revised to meet current compliance requirements. Stanton Kidney MD, MD 04/10/2023 1:32:57 PM This report has been signed electronically. Number of Addenda: 0 Note Initiated On: 04/10/2023 12:51 PM Total Procedure Duration: 0 hours 2 minutes 47 seconds  Estimated Blood Loss:  Estimated blood loss: none.      [facility]

## 2023-04-10 NOTE — Interval H&P Note (Signed)
History and Physical Interval Note:  04/10/2023 1:20 PM  Alec A Duchesne Jr.  has presented today for surgery, with the diagnosis of persistent diarrhea.  The various methods of treatment have been discussed with the patient and family. After consideration of risks, benefits and other options for treatment, the patient has consented to  Procedure(s): FLEXIBLE SIGMOIDOSCOPY ([name]) as a surgical intervention.  The patient's history has been reviewed, patient examined, no change in status, stable for surgery.  I have reviewed the patient's chart and labs.  Questions were answered to the patient's satisfaction.     [place], [place]

## 2023-04-10 NOTE — Progress Notes (Signed)
PT attempt. Pt off floor for flexible sigmoidoscopy. Acute PT will continue to follow per current POC progressing as able per pt tolerance. Author attempted to treat pt earlier in the day however pt's bed linens soaked in urinae and pt completely unaware. Pt still is not at baseline cognition or physical abilities. DC recs remain appropriate to assist pt to PLOF while decreasing caregiver burden.

## 2023-04-10 NOTE — Telephone Encounter (Signed)
Call returned to [facility] and informed that doctor will be in room to see patient between 12-1 PM today. She said she will be there and mentioned that patient is to have a colonoscopy sometime today as well

## 2023-04-10 NOTE — Progress Notes (Signed)
Rounding Note    Patient Name: [name]. Date of Encounter: 04/10/2023  [facility] Cardiologist: Lorine Bears, MD   Subjective   Patient was seen on morning rounds. Scheduled for a sigmoidoscopy today. Remains asymptomatic from a cardiac standpoint. Still in atrial flutter with rates 110-120. BP soft with systolics 90-100s.   Inpatient Medications    Scheduled Meds:  [MAR Hold] apixaban  5 mg Per Tube BID   [MAR Hold] ascorbic acid  500 mg Per Tube Daily   [MAR Hold] aspirin  81 mg Per Tube Daily   [MAR Hold] Chlorhexidine Gluconate Cloth  6 each Topical Daily   [MAR Hold] cholecalciferol  2,000 Units Per Tube Daily   [MAR Hold] clonazePAM  0.5 mg Per Tube BID   [MAR Hold] cyanocobalamin  10,000 mcg Per Tube Daily   [MAR Hold] diltiazem  60 mg Oral Q6H   [MAR Hold] ezetimibe  10 mg Per Tube Daily   [MAR Hold] feeding supplement (GLUCERNA 1.5 CAL)  356 mL Per Tube TID PC & HS   [MAR Hold] ferrous sulfate  300 mg Per Tube Daily   [MAR Hold] fiber supplement (BANATROL TF)  60 mL Per Tube BID   [MAR Hold] free water  200 mL Per Tube TID PC & HS   [MAR Hold] insulin aspart  0-5 Units Subcutaneous QHS   [MAR Hold] insulin aspart  0-9 Units Subcutaneous TID WC   [MAR Hold] insulin glargine-yfgn  25 Units Subcutaneous QHS   [MAR Hold] levothyroxine  25 mcg Per Tube Q0600   [MAR Hold] metoprolol tartrate  25 mg Per Tube Q6H   [MAR Hold] multivitamin with minerals  1 tablet Oral Daily   [MAR Hold] pramipexole  2 mg Per Tube QHS   [MAR Hold] primidone  250 mg Per Tube BID   sodium chloride flush  10 mL Intravenous Q12H   [MAR Hold] sodium zirconium cyclosilicate  10 g Per Tube Daily   [MAR Hold] vancomycin  500 mg Per Tube BID   Continuous Infusions:  cefTRIAXone (ROCEPHIN)  IV     PRN Meds: [MAR Hold] acetaminophen, [MAR Hold] haloperidol, [MAR Hold] ipratropium-albuterol, [MAR Hold] ondansetron, [MAR Hold] mouth rinse   Vital Signs    Vitals:    04/10/23 1348 04/10/23 1351 04/10/23 1356 04/10/23 1358  BP: 101/67 100/69 99/70 99/65   Pulse: (!) 117 (!) 116 (!) 118 (!) 118  Resp: 19 20 (!) 21 (!) 21  Temp:      TempSrc:      SpO2: 97%  96% 96%  Weight:      Height:        Intake/Output Summary (Last 24 hours) at 04/10/2023 1432 Last data filed at 04/10/2023 1329 Gross per 24 hour  Intake 1353.12 ml  Output 800 ml  Net 553.12 ml      04/10/2023    4:25 AM 04/08/2023    5:00 AM 04/06/2023    4:00 AM  Last 3 Weights  Weight (lbs) 271 lb 2.7 oz 269 lb 2.9 oz 272 lb 7.8 oz  Weight (kg) 123 kg 122.1 kg 123.6 kg      Telemetry    Atrial flutter rate 110-120 - Personally Reviewed  Physical Exam   GEN: No acute distress.   Neck: No JVD Cardiac: RRR, no murmurs, rubs, or gallops.  Respiratory: Clear to auscultation bilaterally. GI: Soft, nontender, non-distended  MS: Bilateral BKA without edema  Labs    High Sensitivity Troponin:  No results for input(s): "TROPONINIHS" in the last 720 hours.   Chemistry Recent Labs  Lab 04/05/23 0506 04/06/23 0649 04/08/23 0433 04/09/23 0457 04/10/23 0357  NA 133*   < > 133* 132* 132*  K 4.8   < > 4.9 4.8 4.5  CL 99   < > 99 99 101  CO2 25   < > 25 25 25   GLUCOSE 219*   < > 190* 174* 135*  BUN 33*   < > 36* 41* 39*  CREATININE 1.08   < > 1.25* 1.33* 1.25*  CALCIUM 8.6*   < > 8.7* 8.5* 8.2*  MG 2.2  --   --   --   --   GFRNONAA >60   < > >60 58* >60  ANIONGAP 9   < > 9 8 6    < > = values in this interval not displayed.    Lipids No results for input(s): "CHOL", "TRIG", "HDL", "LABVLDL", "LDLCALC", "CHOLHDL" in the last 168 hours.  Hematology Recent Labs  Lab 04/08/23 0433 04/09/23 0457 04/10/23 0357  WBC 16.4* 16.1* 14.4*  RBC 4.01* 4.14* 3.86*  HGB 11.8* 12.1* 11.6*  HCT 36.1* 36.6* 35.3*  MCV 90.0 88.4 91.5  MCH 29.4 29.2 30.1  MCHC 32.7 33.1 32.9  RDW 14.4 14.5 14.7  PLT 355 334 303   Thyroid No results for input(s): "TSH", "FREET4" in the last 168 hours.   BNPNo results for input(s): "BNP", "PROBNP" in the last 168 hours.  DDimer No results for input(s): "DDIMER" in the last 168 hours.   Radiology    No results found.  Patient Profile     67 y.o. male with a hx of frequent falls, PAD s/p b/l BKA, DM2, afib RVR with prior cardioversion, moderate LVH, CAD with CABG in 2006, HTN, esophageal cancer s/p chemo and G-tube placement, and CKD who is being seen for the further evaluation of aflutter RVR   Assessment & Plan    Atrial flutter with RVR - History of paroxysmal a fib with multiple cardioversions - CHA2DS2-VASc of at least 5  - Continue apixaban 5 mg BID - Remains in atrial flutter with continued elevated rates 110-120 - Received 2 doses of IV digoxin earlier in admission which failed to improved rates - Amiodarone was increased to 30 mg/h yesterday and had little effect on HR, d/c yesterday - Continue diltiazem 30 mg q6hr and metoprolol tartrate 25 mg q6hr - Soft blood pressures are preventing increase in oral medications - Continue telemetry - Not a candidate for TEE given esophageal cancer - Could consider DCCV once 3 weeks of uninterrupted anticoagulation is complete  Acute metabolic encephalopathy - Esophageal cancer s/p G tube - C diff with profuse diarrhea - AMS with agitation, requires safety observations - Management per IM  CAD s/p CABG/PAD s/p BKA - Denies chest pain - Continue ASA, apixaban, and exetimibe  For questions or updates, please contact [facility] Please consult [email or web address] for contact info under        Signed, Orion Crook, PA-C  04/10/2023, 2:32 PM

## 2023-04-10 NOTE — Progress Notes (Addendum)
PROGRESS NOTE    Jermane Lowry Bowl.  ZOX:[id number] DOB: 12-10-1955 DOA: 03/28/2023 PCP: [name], MD    Brief Narrative:   [name] is a 67 y.o. male with medical history significant of atrial fibrillation, asthma, congestive heart failure, COPD, coronary artery disease, depression, diabetes mellitus, gout, hyperlipidemia, hypertension, hypogonadism in male, morbid obesity, peripheral vascular disease, OSA on CPAP at night, esophageal cancer status post chemo and G-tube placement.  Patient currently being managed for hyperkalemia, severe dehydration and AKI and acute metabolic encephalopathy.    Assessment & Plan:   Principal Problem:   Hyperkalemia Active Problems:   Delirium   Acute kidney injury (HCC)   Malnutrition of moderate degree   Acute hyperkalemia   Diarrhea   Perinephric fluid collection   Persistent atrial fibrillation (HCC)   Typical atrial flutter (HCC)   Diarrhea of infectious origin   Atrial flutter with rapid ventricular response (HCC)  Acute kidney injury secondary to dehydration Initially resolved now with up-trending cr in setting of ongoing diarrhea -Creatinine improved on 12/11.  Hold fluids   Delirium Intermittent, delirious this morning got haldol, improving now - haldol prn   End-of-life care Patient remains a full scope of treatment, full code   Bacteriuria Klebsiella UTI It is possible that Klebsiella in the urine represents chronic bacteria and colonization.  However given mentation altered from baseline it is not unreasonable to give patient a trial of intravenous antibiotics to assess for any improvement in mentation.  If there is any deterioration from a C. difficile standpoint we will need to will hold on UTI treatment. Plan: Rocephin 1 g every 24 hours per sensitivities  Acute diarrhea secondary to C. difficile infection Continues to have profuse watery BMs multiple times a day Per GI this could be related to  tube feeds which can produce 6 watery bowel movements daily Patient is status post 10 days of Dificid.  Vancomycin added by GI on 12/8 Status post flexible sigmoidoscopy on 12/11.  Normal-appearing mucosa.  Biopsies taken Plan: Continue p.o. vancomycin  Possible right perinephric hematoma --Described as possible abscess by radiology but per secure chat communication w/ Dr. Apolinar Junes on 12/9, this is a chornic complex hematoma dating back over a year, nothing that has been drainable thus far, possible underlying renal mass, and in light of significant comorbidities and as he is asymptomatic inpatient evaluation has been deferred, plan is outpatient urology f/u, consider MRI then.   Severe life-threatening hyperkalemia Has required daily lokelma for the past several days - continue daily lokelma for now.  Potassium 4.5 as of 1211    Chronic persistent atrial flutter with rvr Cardiology now following. Remains in rvr, hemodynamically stable Plan: On apixaban On dilt and metop currently  Amio infusion discontinued per cardiology   Urinary retention Foley discontinued , now voiding spontaneously, bladder scan neg, thus appears to be resolved   Chronic diastolic congestive heart failure --Recent EF of 55% on echo obtained on November 2024   COPD --Continue as needed nebulization    Coronary artery disease,  --Continue aspirin therapy   Diabetes mellitus type 2 with hyperglycemia -- Continue Semglee 25 units nightly (previous dose 35 units)   Hypothyroidism --on levothyroxine     Nutrition Status: Nutrition Problem: Moderate Malnutrition Etiology: chronic illness (esopahgeal mass, likely malginant) Signs/Symptoms: mild fat depletion, mild muscle depletion Interventions: Tube feeding   Peripheral vascular disease S/p bilateral BKAs --Continue aspirin  --Patient is allergic to statin therapy   OSA  on CPAP at night,  --CPAP at night   Esophageal cancer status post chemo and  G-tube placement. --pt gets Bolus TF -Restarted post endoscopy   DVT prophylaxis: Apixaban Code Status: Full Family Communication:Spouse [name] [phone] on 12/11 Disposition Plan: Status is: Inpatient Remains inpatient appropriate because: Multiple acute issues as above   Level of care: Progressive  Consultants:  GI Oncology  Procedures:  Flexible sigmoidoscopy  Antimicrobials: Ceftriaxone   Subjective: Seen and examined prior to endoscopy.  Multiple watery bowel movements daily.  Alert and aware of the plan  Objective: Vitals:   04/10/23 1356 04/10/23 1358 04/10/23 1448 04/10/23 1618  BP: 99/70 99/65 106/70   Pulse: (!) 118 (!) 118 (!) 117   Resp: (!) 21 (!) 21 20   Temp:   97.7 F (36.5 C) 97.6 F (36.4 C)  TempSrc:   Axillary Oral  SpO2: 96% 96% 94%   Weight:      Height:        Intake/Output Summary (Last 24 hours) at 04/10/2023 1626 Last data filed at 04/10/2023 1329 Gross per 24 hour  Intake 300 ml  Output 550 ml  Net -250 ml   Filed Weights   04/06/23 0400 04/08/23 0500 04/10/23 0425  Weight: 123.6 kg 122.1 kg 123 kg    Examination:  General exam: NAD.  Appears chronically ill Respiratory system: Right base crackles.  Normal work of breathing.  Room air Cardiovascular system: S1-S2, tachycardic, irregular rhythm, no murmurs  Gastrointestinal system: Soft, nondistended, hyperactive bowel sounds, + PEG Central nervous system: ANO x 2.  No focal deficits Extremities: Bilateral BKA Skin: No rashes, lesions or ulcers Psychiatry: Judgement and insight appear normal. Mood & affect appropriate.     Data Reviewed: I have personally reviewed following labs and imaging studies  CBC: Recent Labs  Lab 04/06/23 0649 04/07/23 0445 04/08/23 0433 04/09/23 0457 04/10/23 0357  WBC 11.9* 12.6* 16.4* 16.1* 14.4*  HGB 12.0* 12.2* 11.8* 12.1* 11.6*  HCT 35.8* 37.1* 36.1* 36.6* 35.3*  MCV 88.8 89.0 90.0 88.4 91.5  PLT 296 351 355 334 303   Basic  Metabolic Panel: Recent Labs  Lab 04/05/23 0506 04/06/23 0649 04/07/23 0445 04/08/23 0433 04/09/23 0457 04/10/23 0357  NA 133* 134* 132* 133* 132* 132*  K 4.8 5.2* 5.3* 4.9 4.8 4.5  CL 99 101 99 99 99 101  CO2 25 26 23 25 25 25   GLUCOSE 219* 169* 171* 190* 174* 135*  BUN 33* 28* 32* 36* 41* 39*  CREATININE 1.08 1.10 1.14 1.25* 1.33* 1.25*  CALCIUM 8.6* 8.8* 8.8* 8.7* 8.5* 8.2*  MG 2.2  --   --   --   --   --    GFR: Estimated Creatinine Clearance: 68.9 mL/min (A) (by C-G formula based on SCr of 1.25 mg/dL (H)). Liver Function Tests: No results for input(s): "AST", "ALT", "ALKPHOS", "BILITOT", "PROT", "ALBUMIN" in the last 168 hours. No results for input(s): "LIPASE", "AMYLASE" in the last 168 hours. No results for input(s): "AMMONIA" in the last 168 hours. Coagulation Profile: No results for input(s): "INR", "PROTIME" in the last 168 hours. Cardiac Enzymes: No results for input(s): "CKTOTAL", "CKMB", "CKMBINDEX", "TROPONINI" in the last 168 hours. BNP (last 3 results) No results for input(s): "PROBNP" in the last 8760 hours. HbA1C: No results for input(s): "HGBA1C" in the last 72 hours. CBG: Recent Labs  Lab 04/09/23 1607 04/09/23 2041 04/10/23 0824 04/10/23 1200 04/10/23 1616  GLUCAP 210* 144* 149* 158* 155*   Lipid Profile: No  results for input(s): "CHOL", "HDL", "LDLCALC", "TRIG", "CHOLHDL", "LDLDIRECT" in the last 72 hours. Thyroid Function Tests: No results for input(s): "TSH", "T4TOTAL", "FREET4", "T3FREE", "THYROIDAB" in the last 72 hours. Anemia Panel: No results for input(s): "VITAMINB12", "FOLATE", "FERRITIN", "TIBC", "IRON", "RETICCTPCT" in the last 72 hours. Sepsis Labs: Recent Labs  Lab 04/10/23 0357  PROCALCITON 0.19    Recent Results (from the past 240 hour(s))  MRSA Next Gen by PCR, Nasal     Status: Abnormal   Collection Time: 04/05/23  7:45 PM   Specimen: Nasal Mucosa; Nasal Swab  Result Value Ref Range Status   MRSA by PCR Next Gen  DETECTED (A) NOT DETECTED Final    Comment: CRITICAL RESULT CALLED TO, READ BACK BY AND VERIFIED WITH: Celene Kras RN @2154  04/05/23 LFD (NOTE) The GeneXpert MRSA Assay (FDA approved for NASAL specimens only), is one component of a comprehensive MRSA colonization surveillance program. It is not intended to diagnose MRSA infection nor to guide or monitor treatment for MRSA infections. Test performance is not FDA approved in patients less than 30 years old. Performed at [facility], [address] [place], [place] [address]   Urine Culture     Status: Abnormal   Collection Time: 04/06/23  4:20 PM   Specimen: Urine, Catheterized  Result Value Ref Range Status   Specimen Description   Final    URINE, CATHETERIZED Performed at [facility], [address] [place], [place] [address]    Special Requests   Final    NONE Performed at [facility], [address] [place], [place] [address]    Culture >=100,000 COLONIES/mL KLEBSIELLA PNEUMONIAE (A)  Final   Report Status 04/09/2023 FINAL  Final   Organism ID, Bacteria KLEBSIELLA PNEUMONIAE (A)  Final      Susceptibility   Klebsiella pneumoniae - MIC*    AMPICILLIN >=32 RESISTANT Resistant     CEFAZOLIN <=4 SENSITIVE Sensitive     CEFEPIME <=0.12 SENSITIVE Sensitive     CEFTRIAXONE <=0.25 SENSITIVE Sensitive     CIPROFLOXACIN <=0.25 SENSITIVE Sensitive     GENTAMICIN <=1 SENSITIVE Sensitive     IMIPENEM <=0.25 SENSITIVE Sensitive     NITROFURANTOIN 64 INTERMEDIATE Intermediate     TRIMETH/SULFA <=20 SENSITIVE Sensitive     AMPICILLIN/SULBACTAM 4 SENSITIVE Sensitive     PIP/TAZO <=4 SENSITIVE Sensitive ug/mL    * >=100,000 COLONIES/mL KLEBSIELLA PNEUMONIAE         Radiology Studies: No results found.      Scheduled Meds:  apixaban  5 mg Per Tube BID   ascorbic acid  500 mg Per Tube Daily   aspirin  81 mg Per Tube Daily   Chlorhexidine Gluconate Cloth  6 each Topical Daily    cholecalciferol  2,000 Units Per Tube Daily   clonazePAM  0.5 mg Per Tube BID   cyanocobalamin  10,000 mcg Per Tube Daily   diltiazem  60 mg Oral Q6H   ezetimibe  10 mg Per Tube Daily   feeding supplement (GLUCERNA 1.5 CAL)  356 mL Per Tube TID PC & HS   ferrous sulfate  300 mg Per Tube Daily   fiber supplement (BANATROL TF)  60 mL Per Tube BID   free water  200 mL Per Tube TID PC & HS   insulin aspart  0-5 Units Subcutaneous QHS   insulin aspart  0-9 Units Subcutaneous TID WC   insulin glargine-yfgn  25 Units Subcutaneous QHS   levothyroxine  25 mcg  Per Tube Q0600   metoprolol tartrate  25 mg Per Tube Q6H   multivitamin with minerals  1 tablet Oral Daily   pramipexole  2 mg Per Tube QHS   primidone  250 mg Per Tube BID   sodium zirconium cyclosilicate  10 g Per Tube Daily   vancomycin  500 mg Per Tube BID   Continuous Infusions:  cefTRIAXone (ROCEPHIN)  IV       LOS: 13 days      Tresa Moore, MD [facility]   If 7PM-7AM, please contact night-coverage  04/10/2023, 4:26 PM

## 2023-04-10 NOTE — Anesthesia Preprocedure Evaluation (Signed)
Anesthesia Evaluation  Patient identified by MRN, date of birth, ID band Patient confused    Reviewed: Allergy & Precautions, NPO status , Patient's Chart, lab work & pertinent test results  History of Anesthesia Complications Negative for: history of anesthetic complications  Airway Mallampati: IV   Neck ROM: Full    Dental  (+) Upper Dentures, Poor Dentition, Dental Advidsory Given   Pulmonary neg shortness of breath, asthma , sleep apnea , COPD, neg recent URI, former smoker   Pulmonary exam normal breath sounds clear to auscultation       Cardiovascular hypertension, (-) angina + CAD (s/p MI, CABG, stents), + Past MI, + Cardiac Stents, + CABG, + Peripheral Vascular Disease (s/p L BKA; AAA s/p repair) and +CHF  Normal cardiovascular exam+ dysrhythmias (a fib on Eliquis) (-) Valvular Problems/Murmurs Rhythm:Regular Rate:Normal  ECG 02/13/22:  Atrial fibrillation with rapid ventricular response with premature ventricular or aberrantly conducted complexes Right bundle branch block Left anterior fascicular block * Bifascicular block * Cannot rule out Inferior infarct (masked by fascicular block?) , age undetermined   Neuro/Psych  PSYCHIATRIC DISORDERS  Depression    negative neurological ROS     GI/Hepatic negative GI ROS,,,  Endo/Other  diabetes, Type 2Hypothyroidism  Class 4 obesityObesity   Renal/GU Renal disease     Musculoskeletal Gout    Abdominal   Peds  Hematology  (+) Blood dyscrasia, anemia   Anesthesia Other Findings Cardiology note 11/20/22:  1.  Coronary artery disease involving native coronary arteries with other forms of angina: He is doing reasonably well overall.  His symptoms are controlled with diltiazem and Imdur.  Most recent PCI was in 2021 on the right coronary artery.  His LIMA to LAD was patent but SVG grafts were occluded.  Left circumflex is occluded with no patent vein graft.  Continue  aggressive medical therapy.  Given the presence of coronary artery disease and peripheral arterial disease, I elected to keep him on aspirin even though he is on anticoagulation with Eliquis.   2.  Paroxysmal atrial fibrillation: Currently in sinus rhythm on amiodarone and diltiazem.  His LFTs in May were normal.  TSH in January was normal.  He is on long-term anticoagulation with Eliquis.   3.  Chronic diastolic heart failure: He appears to be euvolemic on current dose of furosemide.  Not a good candidate for an SGLT2 inhibitor due to increased risk of infection.   4.  Peripheral arterial disease: Followed by vascular surgery.  Status post bilateral BKA   5.  Hyperlipidemia: Intolerance to statins due to myalgia.  He is currently on Zetia 10 mg once daily.  Most recent lipid profile showed an LDL of 78.   6.  Essential hypertension: Blood pressure is controlled.   Disposition:   FU with me in 6 months  Reproductive/Obstetrics                             Anesthesia Physical Anesthesia Plan  ASA: 4  Anesthesia Plan: General   Post-op Pain Management:    Induction: Intravenous  PONV Risk Score and Plan: 2 and Propofol infusion, TIVA and Treatment may vary due to age or medical condition  Airway Management Planned: Natural Airway and Nasal Cannula  Additional Equipment:   Intra-op Plan:   Post-operative Plan:   Informed Consent: I have reviewed the patients History and Physical, chart, labs and discussed the procedure including the risks, benefits and alternatives for  the proposed anesthesia with the patient or authorized representative who has indicated his/her understanding and acceptance.       Plan Discussed with: CRNA  Anesthesia Plan Comments:         Anesthesia Quick Evaluation

## 2023-04-10 NOTE — Progress Notes (Signed)
Spoke to patient's wife and addressed her concerns we will plan to meet her, the morning tomorrow.

## 2023-04-10 NOTE — Transfer of Care (Signed)
Immediate Anesthesia Transfer of Care Note  Patient: [name].  Procedure(s) Performed: FLEXIBLE SIGMOIDOSCOPY  Patient Location: PACU and Endoscopy Unit  Anesthesia Type:MAC  Level of Consciousness: drowsy  Airway & Oxygen Therapy: Patient Spontanous Breathing and Patient connected to nasal cannula oxygen  Post-op Assessment: Report given to RN  Post vital signs: Reviewed and stable  Last Vitals:  Vitals Value Taken Time  BP 102/67   Temp    Pulse 111   Resp 18   SpO2 98     Last Pain:  Vitals:   04/10/23 1223  TempSrc: Axillary  PainSc:       Patients Stated Pain Goal: 0 (04/07/23 2000)  Complications: No notable events documented.

## 2023-04-10 NOTE — Progress Notes (Signed)
Discussed with Dr. Georgeann Oppenheim. Patient not available in the room, ?  sigmoidoscopy.  Will check on patient tomorrow.  For now will cancel appts in cancer center.   GB

## 2023-04-10 NOTE — Plan of Care (Signed)
  Problem: Education: Goal: Ability to describe self-care measures that may prevent or decrease complications (Diabetes Survival Skills Education) will improve Outcome: Progressing   Problem: Coping: Goal: Ability to adjust to condition or change in health will improve Outcome: Progressing   Problem: Health Behavior/Discharge Planning: Goal: Ability to identify and utilize available resources and services will improve Outcome: Progressing Goal: Ability to manage health-related needs will improve Outcome: Progressing   Problem: Metabolic: Goal: Ability to maintain appropriate glucose levels will improve Outcome: Progressing

## 2023-04-11 ENCOUNTER — Inpatient Hospital Stay: Payer: Medicare Other

## 2023-04-11 ENCOUNTER — Encounter: Payer: Self-pay | Admitting: Internal Medicine

## 2023-04-11 ENCOUNTER — Inpatient Hospital Stay: Payer: Medicare Other | Admitting: Internal Medicine

## 2023-04-11 ENCOUNTER — Inpatient Hospital Stay: Payer: Medicare Other | Attending: Internal Medicine

## 2023-04-11 DIAGNOSIS — A09 Infectious gastroenteritis and colitis, unspecified: Secondary | ICD-10-CM | POA: Diagnosis not present

## 2023-04-11 DIAGNOSIS — E875 Hyperkalemia: Secondary | ICD-10-CM | POA: Diagnosis not present

## 2023-04-11 DIAGNOSIS — Z515 Encounter for palliative care: Secondary | ICD-10-CM | POA: Diagnosis not present

## 2023-04-11 DIAGNOSIS — I4819 Other persistent atrial fibrillation: Secondary | ICD-10-CM | POA: Diagnosis not present

## 2023-04-11 DIAGNOSIS — I4892 Unspecified atrial flutter: Secondary | ICD-10-CM | POA: Diagnosis not present

## 2023-04-11 DIAGNOSIS — N2889 Other specified disorders of kidney and ureter: Secondary | ICD-10-CM | POA: Diagnosis not present

## 2023-04-11 DIAGNOSIS — C155 Malignant neoplasm of lower third of esophagus: Secondary | ICD-10-CM

## 2023-04-11 LAB — CBC
HCT: 32.5 % — ABNORMAL LOW (ref 39.0–52.0)
Hemoglobin: 10.6 g/dL — ABNORMAL LOW (ref 13.0–17.0)
MCH: 29.2 pg (ref 26.0–34.0)
MCHC: 32.6 g/dL (ref 30.0–36.0)
MCV: 89.5 fL (ref 80.0–100.0)
Platelets: 282 10*3/uL (ref 150–400)
RBC: 3.63 MIL/uL — ABNORMAL LOW (ref 4.22–5.81)
RDW: 14.5 % (ref 11.5–15.5)
WBC: 13.3 10*3/uL — ABNORMAL HIGH (ref 4.0–10.5)
nRBC: 0 % (ref 0.0–0.2)

## 2023-04-11 LAB — BASIC METABOLIC PANEL
Anion gap: 5 (ref 5–15)
BUN: 36 mg/dL — ABNORMAL HIGH (ref 8–23)
CO2: 25 mmol/L (ref 22–32)
Calcium: 8.1 mg/dL — ABNORMAL LOW (ref 8.9–10.3)
Chloride: 103 mmol/L (ref 98–111)
Creatinine, Ser: 1.25 mg/dL — ABNORMAL HIGH (ref 0.61–1.24)
GFR, Estimated: >60 mL/min (ref >60)
Glucose, Bld: 208 mg/dL — ABNORMAL HIGH (ref 70–99)
Potassium: 4.8 mmol/L (ref 3.5–5.1)
Sodium: 133 mmol/L — ABNORMAL LOW (ref 135–145)

## 2023-04-11 LAB — GLUCOSE, CAPILLARY
Glucose-Capillary: 185 mg/dL — ABNORMAL HIGH (ref 70–99)
Glucose-Capillary: 190 mg/dL — ABNORMAL HIGH (ref 70–99)
Glucose-Capillary: 192 mg/dL — ABNORMAL HIGH (ref 70–99)
Glucose-Capillary: 166 mg/dL — ABNORMAL HIGH (ref 70–99)
Glucose-Capillary: 193 mg/dL — ABNORMAL HIGH (ref 70–99)
Glucose-Capillary: 174 mg/dL — ABNORMAL HIGH (ref 70–99)

## 2023-04-11 LAB — SURGICAL PATHOLOGY

## 2023-04-11 NOTE — Consult Note (Signed)
Palliative Medicine [facility] at [facility] [phone] [phone]   Name: [name]. Date: 04/11/2023 MRN: [id number]  DOB: Apr 25, 1956  Patient Care Team: [name], MD as PCP - General (Internal Medicine) [name], MD as PCP - Cardiology (Cardiology) [name], MD as Consulting Physician (Oncology)    REASON FOR CONSULTATION: [name]. is a 67 y.o. male with multiple medical problems including COPD, A-fib, history of CHF, PVD status post bilateral BKAs, and esophageal cancer status post PEG.  Patient admitted the hospital with multiple metabolic derangements and metabolic encephalopathy.  Found to have UTI and C. difficile colitis.  Palliative care consulted to address goals.  SOCIAL HISTORY:     reports that he quit smoking about 8 years ago. His smoking use included cigarettes. He started smoking about 53 years ago. He has a 22.5 pack-year smoking history. He has never used smokeless tobacco. He reports that he does not currently use alcohol after a past usage of about 3.0 standard drinks of alcohol per week. He reports that he does not use drugs.  Patient is married lives at home with his wife.  He had 2 children, both of whom are now deceased.  Patient worked as a Psychologist, occupational.  ADVANCE DIRECTIVES:  Not on file  CODE STATUS: Full code  PAST MEDICAL HISTORY: Past Medical History:  Diagnosis Date   Arrhythmia    atrial fibrillation   Asthma    CHF (congestive heart failure) (HCC)    COPD (chronic obstructive pulmonary disease) (HCC)    Coronary artery disease    Depression    Diabetes mellitus without complication (HCC)    Gout    History anabolic steroid use    Hyperlipidemia    Hypertension    Hypogonadism in male    MI (myocardial infarction) (HCC)    Morbid obesity (HCC)    Myocardial infarction (HCC)    Peripheral vascular disease (HCC)    Perirectal abscess     Pleurisy    Sleep apnea    CPAP at night, no oxygen   Varicella     PAST SURGICAL HISTORY:  Past Surgical History:  Procedure Laterality Date   ABDOMINAL AORTIC ANEURYSM REPAIR     ACHILLES TENDON SURGERY Left 01/10/2021   Procedure: ACHILLES LENGTHENING/KIDNER;  Surgeon: Rosetta Posner, DPM;  Location: [facility] ORS;  Service: Podiatry;  Laterality: Left;   AMPUTATION Left 10/03/2021   Procedure: AMPUTATION BELOW KNEE;  Surgeon: Renford Dills, MD;  Location: [facility] ORS;  Service: Vascular;  Laterality: Left;   AMPUTATION Right 05/24/2022   Procedure: AMPUTATION BELOW KNEE;  Surgeon: Annice Needy, MD;  Location: [facility];  Service: General;  Laterality: Right;   AMPUTATION TOE Right 02/10/2016   Procedure: AMPUTATION TOE 3RD TOE;  Surgeon: Gwyneth Revels, DPM;  Location: [facility];  Service: Podiatry;  Laterality: Right;   AMPUTATION TOE Left 02/24/2020   Procedure: AMPUTATION TOE;  Surgeon: Rosetta Posner, DPM;  Location: [facility];  Service: Podiatry;  Laterality: Left;   APPLICATION OF WOUND VAC Left 02/29/2020   Procedure: APPLICATION OF WOUND VAC;  Surgeon: Rosetta Posner, DPM;  Location: [facility];  Service: Podiatry;  Laterality: Left;   BIOPSY  03/15/2023   Procedure: BIOPSY;  Surgeon: Wyline Mood, MD;  Location: [facility] ENDOSCOPY;  Service: Gastroenterology;;   BIOPSY  04/10/2023   Procedure: BIOPSY;  Surgeon: Toledo, Boykin Nearing, MD;  Location: [facility] ENDOSCOPY;  Service: Gastroenterology;;  CARDIAC CATHETERIZATION     CARDIOVERSION [name] 02/13/2022   Procedure: CARDIOVERSION;  Surgeon: Lamar Blinks, MD;  Location: [facility];  Service: Cardiovascular;  Laterality: [name];   COLONOSCOPY WITH PROPOFOL [name] 11/18/2015   Procedure: COLONOSCOPY WITH PROPOFOL;  Surgeon: Scot Jun, MD;  Location: [facility] ENDOSCOPY;  Service: Endoscopy;  Laterality: [name];   CORONARY ARTERY BYPASS GRAFT     CORONARY STENT INTERVENTION [name] 02/02/2020   Procedure: CORONARY STENT INTERVENTION;  Surgeon: Marcina Millard, MD;  Location: [facility] INVASIVE CV LAB;  Service: Cardiovascular;  Laterality: [name];   ESOPHAGOGASTRODUODENOSCOPY (EGD) WITH PROPOFOL [name] 03/15/2023   Procedure: ESOPHAGOGASTRODUODENOSCOPY (EGD) WITH PROPOFOL;  Surgeon: Wyline Mood, MD;  Location: [facility] ENDOSCOPY;  Service: Gastroenterology;  Laterality: [name];   FLEXIBLE SIGMOIDOSCOPY [name] 04/10/2023   Procedure: FLEXIBLE SIGMOIDOSCOPY;  Surgeon: Toledo, Boykin Nearing, MD;  Location: [facility] ENDOSCOPY;  Service: Gastroenterology;  Laterality: [name];   GASTROSTOMY [name] 03/18/2023   Procedure: INSERTION OF GASTROSTOMY TUBE;  Surgeon: Carolan Shiver, MD;  Location: [facility];  Service: General;  Laterality: [name];   IMPACTION REMOVAL  03/15/2023   Procedure: IMPACTION REMOVAL;  Surgeon: Wyline Mood, MD;  Location: [facility] ENDOSCOPY;  Service: Gastroenterology;;   INCISION AND DRAINAGE Left 08/07/2021   Procedure: INCISION AND DRAINAGE-Partial Calcanectomy;  Surgeon: Rosetta Posner, DPM;  Location: [facility] OR[facility];  Service: Podiatry;  Laterality: Left;   IRRIGATION AND DEBRIDEMENT FOOT Left 02/29/2020   Procedure: IRRIGATION AND DEBRIDEMENT FOOT;  Surgeon: Rosetta Posner, DPM;  Location: [facility];  Service: Podiatry;  Laterality: Left;   IRRIGATION AND DEBRIDEMENT FOOT Left 02/24/2020   Procedure: IRRIGATION AND DEBRIDEMENT FOOT;  Surgeon: Rosetta Posner, DPM;  Location: [facility];  Service: Podiatry;  Laterality: Left;   KNEE ARTHROSCOPY     LEFT HEART CATH AND CORS/GRAFTS ANGIOGRAPHY [name] 02/02/2020   Procedure: LEFT HEART CATH AND CORS/GRAFTS ANGIOGRAPHY;  Surgeon: Dalia Heading, MD;  Location: [facility] INV[facility] CV LAB;  Service: Cardiovascular;  Laterality: [name];   LOWER EXTREMITY ANGIOGRAPHY Left 02/25/2020   Procedure: Lower Extremity Angiography;  Surgeon: Annice Needy, MD;  Location: [facility] CV [facility];  Service: Cardiovascular;  Laterality: Left;   LOWER EXTREMITY ANGIOGRAPHY Left 01/04/2021   Procedure: LOWER EXTREMITY ANGIOGRAPHY;  Surgeon: Annice Needy,  MD;  Location: [facility];  Service: Cardiovascular;  Laterality: Left;   LOWER EXTREMITY ANGIOGRAPHY Right 05/21/2022   Procedure: Lower Extremity Angiography;  Surgeon: Annice Needy, MD;  Location: [facility];  Service: Cardiovascular;  Laterality: Right;   METATARSAL HEAD EXCISION Left 01/10/2021   Procedure: METATARSAL HEAD EXCISION - LEFT 5th;  Surgeon: Rosetta Posner, DPM;  Location: [facility];  Service: Podiatry;  Laterality: Left;   PERIPHERAL VASCULAR CATHETERIZATION Right 01/24/2016   Procedure: Lower Extremity Angiography;  Surgeon: Renford Dills, MD;  Location: [facility];  Service: Cardiovascular;  Laterality: Right;   PERIPHERAL VASCULAR CATHETERIZATION Right 01/25/2016   Procedure: Lower Extremity Angiography;  Surgeon: Renford Dills, MD;  Location: [facility] CV LAB;  Service: Cardiovascular;  Laterality: Right;   PORTACATH PLACEMENT [name] 03/18/2023   Procedure: INSERTION PORT-A-CATH;  Surgeon: Carolan Shiver, MD;  Location: [facility] OR[facility];  Service: General;  Laterality: [name];   TOE AMPUTATION     TONSILLECTOMY      HEMATOLOGY/ONCOLOGY HISTORY:  Oncology History  Esophageal adenocarcinoma (HCC)  03/15/2023 Initial Diagnosis   Esophageal adenocarcinoma (HCC)   04/11/2023 -  Chemotherapy   Patient is on Treatment Plan : ESOPHAGUS Carboplatin + Paclitaxel Weekly  X 6 Weeks with XRT       ALLERGIES:  is allergic to penicillins, statins, and metformin and related.  MEDICATIONS:  Current Facility-Administered Medications  Medication Dose Route Frequency Provider Last Rate Last Admin   acetaminophen (TYLENOL) tablet 650 mg  650 mg Oral Q6H PRN Enedina Finner, MD   650 mg at 04/04/23 0706   apixaban (ELIQUIS) tablet 5 mg  5 mg Per Tube BID Enedina Finner, MD   5 mg at 04/11/23 1003   ascorbic acid (VITAMIN C) tablet 500 mg  500 mg Per Tube Daily Enedina Finner, MD   500 mg at 04/11/23 1003   aspirin chewable tablet 81 mg  81 mg Per Tube Daily  Rosezetta Schlatter T, MD   81 mg at 04/11/23 1004   cefTRIAXone (ROCEPHIN) 1 g in sodium chloride 0.9 % 100 mL IVPB  1 g Intravenous Q24H Lolita Patella B, MD 200 mL/hr at 04/10/23 1715 1 g at 04/10/23 1715   Chlorhexidine Gluconate Cloth 2 % PADS 6 each  6 each Topical Daily Loyce Dys, MD   6 each at 04/11/23 1023   cholecalciferol (VITAMIN D3) 25 MCG (1000 UNIT) tablet 2,000 Units  2,000 Units Per Tube Daily Enedina Finner, MD   2,000 Units at 04/11/23 1004   clonazePAM (KLONOPIN) tablet 0.5 mg  0.5 mg Per Tube BID Rosezetta Schlatter T, MD   0.5 mg at 04/11/23 1003   cyanocobalamin (VITAMIN B12) tablet 10,000 mcg  10,000 mcg Per Tube Daily Barrie Folk, RPH   10,000 mcg at 04/11/23 1004   diltiazem (CARDIZEM) tablet 60 mg  60 mg Oral Q6H Antonieta Iba, MD   60 mg at 04/11/23 1308   ezetimibe (ZETIA) tablet 10 mg  10 mg Per Tube Daily Enedina Finner, MD   10 mg at 04/11/23 1003   feeding supplement (GLUCERNA 1.5 CAL) liquid 356 mL  356 mL Per Tube TID PC & HS Rosezetta Schlatter T, MD   356 mL at 04/11/23 1308   ferrous sulfate 220 (44 Fe) MG/5ML solution 300 mg  300 mg Per Tube Daily Enedina Finner, MD   300 mg at 04/11/23 1004   fiber supplement (BANATROL TF) liquid 60 mL  60 mL Per Tube BID Kathrynn Running, MD   60 mL at 04/11/23 1023   free water 200 mL  200 mL Per Tube TID PC & HS Wouk, Wilfred Curtis, MD   200 mL at 04/11/23 1308   haloperidol (HALDOL) tablet 5 mg  5 mg Per Tube Q6H PRN Kathrynn Running, MD   5 mg at 04/09/23 0014   insulin aspart (novoLOG) injection 0-5 Units  0-5 Units Subcutaneous QHS Rosezetta Schlatter T, MD   3 Units at 04/08/23 2226   insulin aspart (novoLOG) injection 0-9 Units  0-9 Units Subcutaneous TID WC Loyce Dys, MD   2 Units at 04/11/23 1308   insulin glargine-yfgn (SEMGLEE) injection 25 Units  25 Units Subcutaneous QHS Kathrynn Running, MD   25 Units at 04/10/23 2253   ipratropium-albuterol (DUONEB) 0.5-2.5 (3) MG/3ML nebulizer solution 3 mL  3 mL Nebulization Q4H  PRN Loyce Dys, MD       levothyroxine (SYNTHROID) tablet 25 mcg  25 mcg Per Tube [id number] Loyce Dys, MD   25 mcg at 04/11/23 0513   metoprolol tartrate (LOPRESSOR) tablet 25 mg  25 mg Per Tube Q6H Jodelle Red, MD   25 mg at 04/11/23 1308   multivitamin  with minerals tablet 1 tablet  1 tablet Oral Daily Enedina Finner, MD   1 tablet at 04/11/23 1004   ondansetron (ZOFRAN-ODT) disintegrating tablet 4 mg  4 mg Oral Q6H PRN Kathrynn Running, MD   4 mg at 04/04/23 0981   Oral care mouth rinse  15 mL Mouth Rinse PRN Wouk, Wilfred Curtis, MD       pramipexole (MIRAPEX) tablet 2 mg  2 mg Per Tube QHS Rosezetta Schlatter T, MD   2 mg at 04/10/23 2254   primidone (MYSOLINE) tablet 250 mg  250 mg Per Tube BID Enedina Finner, MD   250 mg at 04/11/23 1004   sodium zirconium cyclosilicate (LOKELMA) packet 10 g  10 g Per Tube Daily Kathrynn Running, MD   10 g at 04/11/23 1024   vancomycin (VANCOCIN) 50 mg/mL oral solution SOLN 500 mg  500 mg Per Tube BID Wylie Hail M, PA-C   500 mg at 04/11/23 1004    VITAL SIGNS: BP 110/74 (BP Location: Right Arm)   Pulse (!) 118   Temp 98.4 F (36.9 C) (Oral)   Resp 18   Ht 5\' 5"  (1.651 m) Comment: Bilateral BKAs  Wt 275 lb 9.2 oz (125 kg)   SpO2 94%   BMI 45.86 kg/m  Filed Weights   04/08/23 0500 04/10/23 0425 04/11/23 0338  Weight: 269 lb 2.9 oz (122.1 kg) 271 lb 2.7 oz (123 kg) 275 lb 9.2 oz (125 kg)    Estimated body mass index is 45.86 kg/m as calculated from the following:   Height as of this encounter: 5\' 5"  (1.651 m).   Weight as of this encounter: 275 lb 9.2 oz (125 kg).  LABS: CBC:    Component Value Date/Time   WBC 13.3 (H) 04/11/2023 0435   HGB 10.6 (L) 04/11/2023 0435   HGB 10.7 (L) 05/15/2022 1440   HCT 32.5 (L) 04/11/2023 0435   HCT 33.4 (L) 05/15/2022 1440   PLT 282 04/11/2023 0435   PLT 394 05/15/2022 1440   MCV 89.5 04/11/2023 0435   MCV 89 05/15/2022 1440   MCV 96 07/12/2011 0919   NEUTROABS 7.5 03/31/2023 0320    NEUTROABS 7.5 (H) 07/12/2011 0919   LYMPHSABS 0.7 03/31/2023 0320   LYMPHSABS 1.2 07/12/2011 0919   MONOABS 0.7 03/31/2023 0320   MONOABS 0.8 (H) 07/12/2011 0919   EOSABS 0.4 03/31/2023 0320   EOSABS 0.4 07/12/2011 0919   BASOSABS 0.1 03/31/2023 0320   BASOSABS 0.1 07/12/2011 0919   Comprehensive Metabolic Panel:    Component Value Date/Time   NA 133 (L) 04/11/2023 0435   NA 141 07/12/2011 0919   K 4.8 04/11/2023 0435   K 4.4 07/12/2011 0919   CL 103 04/11/2023 0435   CL 102 07/12/2011 0919   CO2 25 04/11/2023 0435   CO2 29 07/12/2011 0919   BUN 36 (H) 04/11/2023 0435   BUN 16 05/05/2013 1022   CREATININE 1.25 (H) 04/11/2023 0435   CREATININE 0.71 05/05/2013 1022   GLUCOSE 208 (H) 04/11/2023 0435   GLUCOSE 76 07/12/2011 0919   CALCIUM 8.1 (L) 04/11/2023 0435   CALCIUM 8.8 07/12/2011 0919   AST 14 (L) 03/28/2023 0011   ALT 18 03/28/2023 0011   ALKPHOS 120 03/28/2023 0011   BILITOT 0.4 03/28/2023 0011   PROT 6.5 03/28/2023 0011   ALBUMIN 2.8 (L) 03/28/2023 0011    RADIOGRAPHIC STUDIES: CT ABDOMEN PELVIS W CONTRAST Result Date: 03/28/2023 CLINICAL DATA:  67 year old male with history of diarrhea.  History of G-tube placed on 03/18/2023. History of esophageal neoplasm. * Tracking Code: BO * EXAM: CT ABDOMEN AND PELVIS WITH CONTRAST TECHNIQUE: Multidetector CT imaging of the abdomen and pelvis was performed using the standard protocol following bolus administration of intravenous contrast. RADIATION DOSE REDUCTION: This exam was performed according to the departmental dose-optimization program which includes automated exposure control, adjustment of the mA and/or kV according to patient size and/or use of iterative reconstruction technique. CONTRAST:  80mL OMNIPAQUE IOHEXOL 300 MG/ML  SOLN COMPARISON:  CT of the abdomen and pelvis 03/15/2023. FINDINGS: Lower chest: Circumferential thickening of the distal esophagus again noted, with similar irregularity just above the level of the  gastroesophageal junction. Atherosclerotic calcifications in the right coronary artery. Mild scarring in the lung bases bilaterally. Hepatobiliary: No suspicious cystic or solid hepatic lesions. No intra or extrahepatic biliary ductal dilatation. Gallbladder is nearly completely decompressed, but otherwise unremarkable in appearance. Pancreas: No pancreatic mass. No pancreatic ductal dilatation. No pancreatic or peripancreatic fluid collections or inflammatory changes. Spleen: Small capsular calcification associated with the medial aspect of the spleen, likely related to remote trauma. Otherwise, unremarkable. Adrenals/Urinary Tract: Right kidney appears distorted secondary to enlarging perinephric fluid collection (discussed below). 1.3 cm low-attenuation lesion in the lower pole of the right kidney and 1.8 cm exophytic low-attenuation lesion in the posterior aspect of the interpolar region of the left kidney are compatible with simple cysts (no imaging follow-up recommended). Bilateral adrenal glands are normal in appearance. No hydroureteronephrosis. Urinary bladder is unremarkable in appearance. Stomach/Bowel: Percutaneous gastrostomy tube noted with retention tip in the distal body of the stomach. Trace amount of gas adjacent to this along the greater curvature of the stomach. Stomach is otherwise unremarkable in appearance. No pathologic dilatation of small bowel or colon. Numerous colonic diverticula are noted, without surrounding inflammatory changes to suggest an acute diverticulitis at this time. Normal appendix. Vascular/Lymphatic: Atherosclerosis in the abdominal aorta and pelvic vasculature. No lymphadenopathy noted in the abdomen or pelvis. Reproductive: Prostate gland and seminal vesicles are unremarkable in appearance. Other: Substantial enlargement of an infiltrative appearing right-sided perinephric fluid collection which extends caudally along the right side of the retroperitoneum intimately  associated with the right quadratus lumborum musculature in the right psoas musculature. This is irregular in shape and therefore difficult to measure accurately, but is best visualized on axial image 42 of series 2 where this currently measures up to 11.5 x 5.9 cm. This is heterogeneous in attenuation, but demonstrates peripheral enhancement with surrounding inflammatory changes, concerning for enlarging abscess. No significant volume of ascites. Trace amount of pneumoperitoneum most evident in the upper abdomen. Musculoskeletal: There are no aggressive appearing lytic or blastic lesions noted in the visualized portions of the skeleton. IMPRESSION: 1. Percutaneous gastrostomy tube retention tip appears appropriately located in the stomach. There is a small volume of pneumoperitoneum, which may be iatrogenic given the recent placement of the tube, although the presence of pneumoperitoneum 10 days postprocedure is unusual. No other source for pneumoperitoneum confidently identified on today's examination. 2. Most notably, however, today's study demonstrates an enlarging right-sided perinephric fluid collection extending caudally in the right-side of the retroperitoneum, highly concerning for enlarging abscess. 3. Persistent circumferential thickening and irregularity of the distal esophagus in this patient with history of esophageal neoplasm. 4. Colonic diverticulosis without evidence of acute diverticulitis at this time. 5. Aortic atherosclerosis, in addition to at least right coronary artery disease. Please note that although the presence of coronary artery calcium documents the presence of coronary  artery disease, the severity of this disease and any potential stenosis cannot be assessed on this non-gated CT examination. Assessment for potential risk factor modification, dietary therapy or pharmacologic therapy may be warranted, if clinically indicated. 6. Additional incidental findings, as above. Electronically  Signed   By: Trudie Reed M.D.   On: 03/28/2023 06:00   DG Chest 1 View Result Date: 03/28/2023 CLINICAL DATA:  Sepsis, profuse diarrhea.  G-tube placed 03/18/2023. EXAM: CHEST  1 VIEW COMPARISON:  Radiographs 05/18/2022 and CT chest abdomen and pelvis 03/15/2023 FINDINGS: Right chest wall Port-A-Cath tip in the mid SVC. Stable cardiomegaly. Sternotomy. Pulmonary vascular congestion. Left basilar atelectasis. No pleural effusion or pneumothorax. IMPRESSION: No acute cardiopulmonary process Electronically Signed   By: Minerva Fester M.D.   On: 03/28/2023 00:28   DG C-Arm 1-60 Min-No Report Result Date: 03/18/2023 Fluoroscopy was utilized by the requesting physician.  No radiographic interpretation.   ECHOCARDIOGRAM COMPLETE Result Date: 03/17/2023    ECHOCARDIOGRAM REPORT   Patient Name:   [name]. Date of Exam: 03/17/2023 Medical Rec #:  [id number]             Height:       76.0 in Accession #:    [id number]            Weight:       297.6 lb Date of Birth:  06/03/1955            BSA:          2.623 m Patient Age:    67 years              BP:           138/73 mmHg Patient Gender: M                     HR:           76 bpm. Exam Location:  [facility] Procedure: 2D Echo and Strain Analysis Indications:     CAD Native Vessel I25.10  History:         Patient has prior history of Echocardiogram examinations, most                  recent 10/05/2021.  Sonographer:     Overton Mam RDCS, FASE Referring Phys:  [id number] Antonieta Iba Diagnosing Phys: Julien Nordmann MD  Sonographer Comments: Global longitudinal strain was attempted. IMPRESSIONS  1. Left ventricular ejection fraction, by estimation, is 55 %. The left ventricle has normal function. The left ventricle demonstrates regional wall motion abnormalities (mild hypokinesis of the basal inferior wall). There is mild left ventricular hypertrophy. Left ventricular diastolic parameters are consistent with Grade I diastolic dysfunction (impaired  relaxation). The average left ventricular global longitudinal strain is -12.0 %.  2. Right ventricular systolic function is normal. The right ventricular size is normal. Tricuspid regurgitation signal is inadequate for assessing PA pressure.  3. The mitral valve is normal in structure. No evidence of mitral valve regurgitation. No evidence of mitral stenosis.  4. The aortic valve is tricuspid. There is mild calcification of the aortic valve. Aortic valve regurgitation is not visualized. Aortic valve sclerosis/calcification is present, without any evidence of aortic stenosis.  5. The inferior vena cava is normal in size with greater than 50% respiratory variability, suggesting right atrial pressure of 3 mmHg. FINDINGS  Left Ventricle: Left ventricular ejection fraction, by estimation, is 55 %. Left ventricular ejection fraction by PLAX is 57 %.  The left ventricle has normal function. The left ventricle demonstrates regional wall motion abnormalities. The average left ventricular global longitudinal strain is -12.0 %. The left ventricular internal cavity size was normal in size. There is mild left ventricular hypertrophy. Left ventricular diastolic parameters are consistent with Grade I diastolic dysfunction (impaired  relaxation). Right Ventricle: The right ventricular size is normal. No increase in right ventricular wall thickness. Right ventricular systolic function is normal. Tricuspid regurgitation signal is inadequate for assessing PA pressure. Left Atrium: Left atrial size was normal in size. Right Atrium: Right atrial size was normal in size. Pericardium: There is no evidence of pericardial effusion. Mitral Valve: The mitral valve is normal in structure. No evidence of mitral valve regurgitation. No evidence of mitral valve stenosis. Tricuspid Valve: The tricuspid valve is normal in structure. Tricuspid valve regurgitation is mild . No evidence of tricuspid stenosis. Aortic Valve: The aortic valve is  tricuspid. There is mild calcification of the aortic valve. Aortic valve regurgitation is not visualized. Aortic valve sclerosis/calcification is present, without any evidence of aortic stenosis. Aortic valve peak gradient measures 11.3 mmHg. Pulmonic Valve: The pulmonic valve was normal in structure. Pulmonic valve regurgitation is not visualized. No evidence of pulmonic stenosis. Aorta: The aortic root is normal in size and structure. Venous: The inferior vena cava is normal in size with greater than 50% respiratory variability, suggesting right atrial pressure of 3 mmHg. IAS/Shunts: No atrial level shunt detected by color flow Doppler.  LEFT VENTRICLE PLAX 2D LV EF:         Left            Diastology                ventricular     LV e' medial:    6.64 cm/s                ejection        LV E/e' medial:  11.2                fraction by     LV e' lateral:   9.79 cm/s                PLAX is 57      LV E/e' lateral: 7.6                %. LVIDd:         5.30 cm         2D LVIDs:         3.70 cm         Longitudinal LV PW:         1.40 cm         Strain LV IVS:        1.40 cm         2D Strain GLS  -12.0 % LVOT diam:     2.35 cm         Avg: LV SV:         83 LV SV Index:   32 LVOT Area:     4.34 cm  RIGHT VENTRICLE RV Basal diam:  3.80 cm RV S prime:     9.57 cm/s TAPSE (M-mode): 1.8 cm LEFT ATRIUM             Index        RIGHT ATRIUM           Index LA diam:  3.80 cm 1.45 cm/m   RA Area:     19.30 cm LA Vol (A2C):   64.8 ml 24.71 ml/m  RA Volume:   56.30 ml  21.46 ml/m LA Vol (A4C):   46.0 ml 17.54 ml/m LA Biplane Vol: 59.6 ml 22.72 ml/m  AORTIC VALVE                 PULMONIC VALVE AV Area (Vmax): 2.27 cm     PV Vmax:        0.87 m/s AV Vmax:        168.00 cm/s  PV Peak grad:   3.0 mmHg AV Peak Grad:   11.3 mmHg    RVOT Peak grad: 4 mmHg LVOT Vmax:      87.80 cm/s LVOT Vmean:     58.600 cm/s LVOT VTI:       0.192 m  AORTA Ao Root diam: 3.70 cm Ao Asc diam:  3.10 cm MITRAL VALVE MV Area (PHT): 3.54 cm     SHUNTS MV Decel Time: 214 msec    Systemic VTI:  0.19 m MV E velocity: 74.40 cm/s  Systemic Diam: 2.35 cm MV A velocity: 97.50 cm/s MV E/A ratio:  0.76 Julien Nordmann MD Electronically signed by Julien Nordmann MD Signature Date/Time: 03/17/2023/2:49:50 PM    Final    CT CHEST ABDOMEN PELVIS W CONTRAST Result Date: 03/16/2023 CLINICAL DATA:  67 year old male with history of esophageal cancer. * Tracking Code: BO * EXAM: CT CHEST, ABDOMEN, AND PELVIS WITH CONTRAST TECHNIQUE: Multidetector CT imaging of the chest, abdomen and pelvis was performed following the standard protocol during bolus administration of intravenous contrast. RADIATION DOSE REDUCTION: This exam was performed according to the departmental dose-optimization program which includes automated exposure control, adjustment of the mA and/or kV according to patient size and/or use of iterative reconstruction technique. CONTRAST:  OMNIPAQUE IOHEXOL 300 MG/ML  SOLN COMPARISON:  CT of the abdomen and pelvis 08/13/2022. Chest CTA 09/10/2013. FINDINGS: CT CHEST FINDINGS Cardiovascular: Heart size is normal. There is no significant pericardial fluid, thickening or pericardial calcification. Atherosclerotic calcifications are noted in the thoracic aorta as well as the left anterior descending, left circumflex and right coronary arteries. Status post median sternotomy for CABG including LIMA to the LAD. Mediastinum/Nodes: No pathologically enlarged mediastinal or hilar lymph nodes. Extensive thickening of the distal esophagus measuring up to 1.7 cm in thickness shortly before the gastroesophageal junction where there is also haziness in the paraesophageal fat of the middle mediastinum, concerning for potential esophageal neoplasm. No axillary lymphadenopathy. Lungs/Pleura: No acute consolidative airspace disease. No pleural effusions. No definite suspicious appearing pulmonary nodules or masses are noted. Scattered areas of mild scarring are noted in  the lung bases bilaterally. Musculoskeletal: Median sternotomy wires. There are no aggressive appearing lytic or blastic lesions noted in the visualized portions of the skeleton. CT ABDOMEN PELVIS FINDINGS Hepatobiliary: No suspicious cystic or solid hepatic lesions. No intra or extrahepatic biliary ductal dilatation. Gallbladder is unremarkable in appearance. Pancreas: No pancreatic mass. No pancreatic ductal dilatation. No pancreatic or peripancreatic fluid collections or inflammatory changes. Spleen: Unremarkable. Adrenals/Urinary Tract: Exophytic 1.7 cm low-attenuation lesion in the posterior aspect of the interpolar region of the left kidney, compatible with a simple cyst. Centrally low-attenuation peripherally thick walled right-sided perinephric fluid collection (axial image 84 of series 2) measuring 5.0 x 3.5 cm, tracking caudally along the right retroperitoneum anterior to the iliopsoas musculature, likely a chronic and unresolved hematoma, although abscess is not entirely excluded.  The inferior aspect of this appears larger than prior studies (axial image 91 of series 2) measuring up to 6.6 x 2.9 cm just below the lower pole of the right kidney. No hydroureteronephrosis. Urinary bladder is unremarkable in appearance. Mild nodular thickening of the adrenal glands bilaterally, stable compared to the prior study and nonspecific. Stomach/Bowel: Stomach is nearly completely decompressed, but otherwise unremarkable in appearance. No pathologic dilatation of small bowel or colon. Numerous colonic diverticula are noted, without surrounding inflammatory changes to suggest an acute diverticulitis at this time. Normal appendix. Vascular/Lymphatic: Atherosclerosis in the abdominal aorta and pelvic vasculature, without evidence of aneurysm or dissection in the abdominal or pelvic vasculature. Multiple prominent borderline enlarged retroperitoneal lymph nodes, nonspecific. No definite pathologically enlarged lymph nodes  in the abdomen or pelvis. Reproductive: Prostate gland and seminal vesicles are unremarkable in appearance. Other: No significant volume of ascites.  No pneumoperitoneum. Musculoskeletal: There are no aggressive appearing lytic or blastic lesions noted in the visualized portions of the skeleton. IMPRESSION: 1. Circumferential mass-like thickening of the distal esophagus corresponding to reported esophageal neoplasm. Haziness in the paraesophageal fat could reflect early local invasion. No definitive evidence of metastatic disease confidently identified. 2. Chronic perinephric fluid collection adjacent to the right kidney appears larger than the prior examination, now extending caudally in the retroperitoneum. Whether this represents a chronic unresolved hematoma or abscess is uncertain. No internal gas to clearly indicate infection at this time. Further clinical evaluation is recommended. 3. Aortic atherosclerosis, in addition to three-vessel coronary artery disease. Status post median sternotomy for CABG including LIMA to the LAD. 4. Colonic diverticulosis without evidence of acute diverticulitis at this time. 5. Additional incidental findings, as above. Electronically Signed   By: Trudie Reed M.D.   On: 03/16/2023 08:00   DG ESOPHAGUS W DOUBLE CM (HD) Result Date: 03/14/2023 CLINICAL DATA:  Provided history: Essential hypertension. Dysphagia, unspecified type. Additional history: the patient reports dysphagia and vomiting for two weeks with 20 pound weight loss in this time. EXAM: ESOPHAGUS/BARIUM SWALLOW TECHNIQUE: A single contrast examination was performed using thin liquid barium. The exam was performed by Pattricia Boss, PA-C and was supervised and interpreted by Dr. Jackey Loge. FLUOROSCOPY: Radiation Exposure Index (as provided by the fluoroscopic device): 37.70 mGy Kerma COMPARISON:  CT abdomen/pelvis 08/13/2022.  Chest CT 09/10/2013. FINDINGS: Extensive filling defects throughout the mid and  distal thoracic esophagus, likely reflecting retained food and pills and markedly limiting mucosal evaluation. Markedly delayed esophageal contrast transit, with only a small amount of contrast passing into the stomach during the examination. Known small hiatal hernia (better appreciated on the prior CT abdomen/pelvis of 08/13/2022). Findings discussed with Dr. Aram Beecham by telephone at 2:40 p.m. on 03/14/2023. The patient was transported directly from the fluoroscopy suite to the emergency department. IMPRESSION: 1. Extensive filling defects throughout the mid and distal thoracic esophagus, likely reflecting retained food and pills and markedly limiting mucosal evaluation. An obstructing distal esophageal mass or stricture cannot be excluded. Endoscopy should be considered. 2. Markedly delayed esophageal contrast transit, with only a small amount of contrast passing into the stomach during the examination. 3. Known small hiatal hernia. Electronically Signed   By: Jackey Loge D.O.   On: 03/14/2023 15:38    PERFORMANCE STATUS (ECOG) : 3 - Symptomatic, >50% confined to bed  Review of Systems Unless otherwise noted, a complete review of systems is negative.  Physical Exam General: NAD Cardiovascular: Irregular Pulmonary: Unlabored Extremities: Bilateral BKA's Skin: no rashes Neurological: Weakness but  otherwise nonfocal  IMPRESSION: Patient admitted to the hospital 2 weeks ago with multiple metabolic derangements and treated for Klebsiella UTI and C. difficile colitis and A-fib RVR.  Has stage IV adenocarcinoma of the esophagus.  Status post PEG placement during the previous hospitalization.  I spoke with patient regarding goals.  He says that he recognizes that his stage IV adenocarcinoma of the esophagus is not resectable and incurable.  Patient has seen medical oncology with recommendation for chemoradiation.  Patient says that he is interested in pursuing treatment.  He says he is  optimistic regarding his overall prognosis.  He would like to try treatment and then if he does not tolerate it, or if he overall declines, he says he would reconsider his goals.   Patient's disposition is unclear and ultimately may impact his ability to receive cancer treatment.  Discussed CODE STATUS.  Patient says that he is ambivalent about the decision on whether to be resuscitated but he feels his wife would want him to remain a full code.  PLAN: -Continue current scope of treatment -Will plan to speak with patient's wife regarding goals/decision-making.  Case and plan discussed with Dr. Donneta Romberg  Time Total: 45 minutes  Visit consisted of counseling and education dealing with the complex and emotionally intense issues of symptom management and palliative care in the setting of serious and potentially life-threatening illness.Greater than 50%  of this time was spent counseling and coordinating care related to the above assessment and plan.  Signed by: Laurette Schimke, PhD, NP-C

## 2023-04-11 NOTE — Progress Notes (Signed)
PROGRESS NOTE    [name].  ZOX:[id number] DOB: 1956-04-09 DOA: 03/28/2023 PCP: [name], MD    Brief Narrative:   [name]. is a 67 y.o. male with medical history significant of atrial fibrillation, asthma, congestive heart failure, COPD, coronary artery disease, depression, diabetes mellitus, gout, hyperlipidemia, hypertension, hypogonadism in male, morbid obesity, peripheral vascular disease, OSA on CPAP at night, esophageal cancer status post chemo and G-tube placement.  Patient currently being managed for hyperkalemia, severe dehydration and AKI and acute metabolic encephalopathy.    Assessment & Plan:   Principal Problem:   Hyperkalemia Active Problems:   Delirium   Acute kidney injury (HCC)   Malnutrition of moderate degree   Acute hyperkalemia   Diarrhea   Perinephric fluid collection   Persistent atrial fibrillation (HCC)   Typical atrial flutter (HCC)   Diarrhea of infectious origin   Atrial flutter with rapid ventricular response (HCC)   Malignant neoplasm of lower third of esophagus (HCC)   Palliative care encounter  Acute kidney injury secondary to dehydration Initially resolved now with up-trending cr in setting of ongoing diarrhea -Creatinine improved on 12/11.  Hold fluids   Delirium Intermittent.  Wife concerned that UTI may be contributing.  Mental status seems okay - haldol prn -Rocephin for UTI   End-of-life care Patient remains a full scope of treatment, full code   Bacteriuria Klebsiella UTI It is possible that Klebsiella in the urine represents chronic bacteria and colonization.  However given mentation altered from baseline it is not unreasonable to give patient a trial of intravenous antibiotics to assess for any improvement in mentation.  If there is any deterioration from a C. difficile standpoint we will need to will hold on UTI treatment. Plan: Rocephin 1 g every 24 hours per sensitivities.  Monitor  labs  Acute diarrhea secondary to C. difficile infection Continues to have profuse watery BMs multiple times a day Per GI this could be related to tube feeds which can produce 6 watery bowel movements daily Patient is status post 10 days of Dificid.  Vancomycin added by GI on 12/8 Status post flexible sigmoidoscopy on 12/11.  Normal-appearing mucosa.  Biopsies taken.  Diarrhea has been improving as of 12/12 Plan: Continue p.o. vancomycin  Possible right perinephric hematoma --Described as possible abscess by radiology but per secure chat communication w/ Dr. Apolinar Junes on 12/9, this is a chornic complex hematoma dating back over a year, nothing that has been drainable thus far, possible underlying renal mass, and in light of significant comorbidities and as he is asymptomatic inpatient evaluation has been deferred, plan is outpatient urology f/u, consider MRI then.   Severe life-threatening hyperkalemia Has required daily lokelma for the past several days - continue daily lokelma for now.  Potassium 4.5 as of 12/11    Chronic persistent atrial flutter with rvr Cardiology now following. Remains in rvr, hemodynamically stable Plan: On apixaban On dilt and metop currently  Amio infusion discontinued per cardiology Discussed with cardiology.  No plans for cardioversion.  Patient is not far off his goal rate and is relatively asymptomatic   Urinary retention Resolved   Chronic diastolic congestive heart failure --Recent EF of 55% on echo obtained on November 2024   COPD --Continue as needed nebulization    Coronary artery disease,  --Continue aspirin therapy   Diabetes mellitus type 2 with hyperglycemia -- Continue Semglee 25 units nightly (previous dose 35 units)   Hypothyroidism --on levothyroxine  Nutrition Status: Nutrition Problem: Moderate Malnutrition Etiology: chronic illness (esopahgeal mass, likely malginant) Signs/Symptoms: mild fat depletion, mild muscle  depletion Interventions: Tube feeding   Peripheral vascular disease S/p bilateral BKAs --Continue aspirin  --Patient is allergic to statin therapy   OSA on CPAP at night,  --CPAP at night   Esophageal cancer status post chemo and G-tube placement. --pt gets Bolus TF --Restarted post endoscopy   DVT prophylaxis: Apixaban Code Status: Full Family Communication:Spouse [name] [phone] on 12/11, at bedside 12/12 Disposition Plan: Status is: Inpatient Remains inpatient appropriate because: Multiple acute issues as above   Level of care: Progressive  Consultants:  GI Oncology  Procedures:  Flexible sigmoidoscopy  Antimicrobials: Ceftriaxone   Subjective: Seen and examined.  Wife at bedside.  Diarrhea improving.  Objective: Vitals:   04/11/23 0510 04/11/23 0630 04/11/23 0853 04/11/23 1140  BP: 118/63 96/61 106/80 110/74  Pulse:      Resp: 20 20 18    Temp: 99 F (37.2 C)  98.4 F (36.9 C)   TempSrc: Oral  Oral   SpO2: 93%  94%   Weight:      Height:        Intake/Output Summary (Last 24 hours) at 04/11/2023 1514 Last data filed at 04/11/2023 0339 Gross per 24 hour  Intake 456 ml  Output 300 ml  Net 156 ml   Filed Weights   04/08/23 0500 04/10/23 0425 04/11/23 0338  Weight: 122.1 kg 123 kg 125 kg    Examination:  General exam: No acute distress.  Appears fatigued.  Mentation appears improved Respiratory system: Right basilar crackles.  Normal work of breathing.  Room air Cardiovascular system: S1-S2, tachycardic, irregular rhythm, no murmurs  Gastrointestinal system: Soft, nondistended, hyperactive bowel sounds, + PEG Central nervous system: ANO x 2.  No focal deficits Extremities: Bilateral BKA Skin: No rashes, lesions or ulcers Psychiatry: Judgement and insight appear normal. Mood & affect appropriate.     Data Reviewed: I have personally reviewed following labs and imaging studies  CBC: Recent Labs  Lab 04/07/23 0445 04/08/23 0433  04/09/23 0457 04/10/23 0357 04/11/23 0435  WBC 12.6* 16.4* 16.1* 14.4* 13.3*  HGB 12.2* 11.8* 12.1* 11.6* 10.6*  HCT 37.1* 36.1* 36.6* 35.3* 32.5*  MCV 89.0 90.0 88.4 91.5 89.5  PLT 351 355 334 303 282   Basic Metabolic Panel: Recent Labs  Lab 04/05/23 0506 04/06/23 0649 04/07/23 0445 04/08/23 0433 04/09/23 0457 04/10/23 0357 04/11/23 0435  NA 133*   < > 132* 133* 132* 132* 133*  K 4.8   < > 5.3* 4.9 4.8 4.5 4.8  CL 99   < > 99 99 99 101 103  CO2 25   < > 23 25 25 25 25   GLUCOSE 219*   < > 171* 190* 174* 135* 208*  BUN 33*   < > 32* 36* 41* 39* 36*  CREATININE 1.08   < > 1.14 1.25* 1.33* 1.25* 1.25*  CALCIUM 8.6*   < > 8.8* 8.7* 8.5* 8.2* 8.1*  MG 2.2  --   --   --   --   --   --    < > = values in this interval not displayed.   GFR: Estimated Creatinine Clearance: 69.5 mL/min (A) (by C-G formula based on SCr of 1.25 mg/dL (H)). Liver Function Tests: No results for input(s): "AST", "ALT", "ALKPHOS", "BILITOT", "PROT", "ALBUMIN" in the last 168 hours. No results for input(s): "LIPASE", "AMYLASE" in the last 168 hours. No results for input(s): "AMMONIA"  in the last 168 hours. Coagulation Profile: No results for input(s): "INR", "PROTIME" in the last 168 hours. Cardiac Enzymes: No results for input(s): "CKTOTAL", "CKMB", "CKMBINDEX", "TROPONINI" in the last 168 hours. BNP (last 3 results) No results for input(s): "PROBNP" in the last 8760 hours. HbA1C: No results for input(s): "HGBA1C" in the last 72 hours. CBG: Recent Labs  Lab 04/10/23 1934 04/10/23 2253 04/11/23 0337 04/11/23 0852 04/11/23 1137  GLUCAP 188* 169* 185* 190* 192*   Lipid Profile: No results for input(s): "CHOL", "HDL", "LDLCALC", "TRIG", "CHOLHDL", "LDLDIRECT" in the last 72 hours. Thyroid Function Tests: No results for input(s): "TSH", "T4TOTAL", "FREET4", "T3FREE", "THYROIDAB" in the last 72 hours. Anemia Panel: No results for input(s): "VITAMINB12", "FOLATE", "FERRITIN", "TIBC", "IRON",  "RETICCTPCT" in the last 72 hours. Sepsis Labs: Recent Labs  Lab 04/10/23 0357  PROCALCITON 0.19    Recent Results (from the past 240 hours)  MRSA Next Gen by PCR, Nasal     Status: Abnormal   Collection Time: 04/05/23  7:45 PM   Specimen: Nasal Mucosa; Nasal Swab  Result Value Ref Range Status   MRSA by PCR Next Gen DETECTED (A) NOT DETECTED Final    Comment: CRITICAL RESULT CALLED TO, READ BACK BY AND VERIFIED WITH: Celene Kras RN @2154  04/05/23 LFD (NOTE) The GeneXpert MRSA Assay (FDA approved for NASAL specimens only), is one component of a comprehensive MRSA colonization surveillance program. It is not intended to diagnose MRSA infection nor to guide or monitor treatment for MRSA infections. Test performance is not FDA approved in patients less than 2 years old. Performed at [facility], [address] [place], [place] [address]   Urine Culture     Status: Abnormal   Collection Time: 04/06/23  4:20 PM   Specimen: Urine, Catheterized  Result Value Ref Range Status   Specimen Description   Final    URINE, CATHETERIZED Performed at [facility], [address], [place], [place] [address]    Special Requests   Final    NONE Performed at [facility], [address] [place], [place] [address]    Culture >=100,000 COLONIES/mL KLEBSIELLA PNEUMONIAE (A)  Final   Report Status 04/09/2023 FINAL  Final   Organism ID, Bacteria KLEBSIELLA PNEUMONIAE (A)  Final      Susceptibility   Klebsiella pneumoniae - MIC*    AMPICILLIN >=32 RESISTANT Resistant     CEFAZOLIN <=4 SENSITIVE Sensitive     CEFEPIME <=0.12 SENSITIVE Sensitive     CEFTRIAXONE <=0.25 SENSITIVE Sensitive     CIPROFLOXACIN <=0.25 SENSITIVE Sensitive     GENTAMICIN <=1 SENSITIVE Sensitive     IMIPENEM <=0.25 SENSITIVE Sensitive     NITROFURANTOIN 64 INTERMEDIATE Intermediate     TRIMETH/SULFA <=20 SENSITIVE Sensitive     AMPICILLIN/SULBACTAM 4 SENSITIVE Sensitive     PIP/TAZO  <=4 SENSITIVE Sensitive ug/mL    * >=100,000 COLONIES/mL KLEBSIELLA PNEUMONIAE         Radiology Studies: No results found.      Scheduled Meds:  apixaban  5 mg Per Tube BID   ascorbic acid  500 mg Per Tube Daily   aspirin  81 mg Per Tube Daily   Chlorhexidine Gluconate Cloth  6 each Topical Daily   cholecalciferol  2,000 Units Per Tube Daily   clonazePAM  0.5 mg Per Tube BID   cyanocobalamin  10,000 mcg Per Tube Daily   diltiazem  60 mg Oral Q6H   ezetimibe  10 mg Per Tube Daily  feeding supplement (GLUCERNA 1.5 CAL)  356 mL Per Tube TID PC & HS   ferrous sulfate  300 mg Per Tube Daily   fiber supplement (BANATROL TF)  60 mL Per Tube BID   free water  200 mL Per Tube TID PC & HS   insulin aspart  0-5 Units Subcutaneous QHS   insulin aspart  0-9 Units Subcutaneous TID WC   insulin glargine-yfgn  25 Units Subcutaneous QHS   levothyroxine  25 mcg Per Tube Q0600   metoprolol tartrate  25 mg Per Tube Q6H   multivitamin with minerals  1 tablet Oral Daily   pramipexole  2 mg Per Tube QHS   primidone  250 mg Per Tube BID   sodium zirconium cyclosilicate  10 g Per Tube Daily   vancomycin  500 mg Per Tube BID   Continuous Infusions:  cefTRIAXone (ROCEPHIN)  IV 1 g (04/11/23 1445)     LOS: 14 days      Tresa Moore, MD [facility]   If 7PM-7AM, please contact night-coverage  04/11/2023, 3:14 PM

## 2023-04-11 NOTE — Progress Notes (Addendum)
Rounding Note    Patient Name: [name]. Date of Encounter: 04/11/2023  [facility] Cardiologist: Lorine Bears, MD   Subjective   No complaints this morning Reports appetite fair Remains in atrial fibs/flutter rate 110-120, reports he is asymptomatic Blood pressure low but stable Eliquis started December 2, given twice daily since that time  Inpatient Medications    Scheduled Meds:  apixaban  5 mg Per Tube BID   ascorbic acid  500 mg Per Tube Daily   aspirin  81 mg Per Tube Daily   Chlorhexidine Gluconate Cloth  6 each Topical Daily   cholecalciferol  2,000 Units Per Tube Daily   clonazePAM  0.5 mg Per Tube BID   cyanocobalamin  10,000 mcg Per Tube Daily   diltiazem  60 mg Oral Q6H   ezetimibe  10 mg Per Tube Daily   feeding supplement (GLUCERNA 1.5 CAL)  356 mL Per Tube TID PC & HS   ferrous sulfate  300 mg Per Tube Daily   fiber supplement (BANATROL TF)  60 mL Per Tube BID   free water  200 mL Per Tube TID PC & HS   insulin aspart  0-5 Units Subcutaneous QHS   insulin aspart  0-9 Units Subcutaneous TID WC   insulin glargine-yfgn  25 Units Subcutaneous QHS   levothyroxine  25 mcg Per Tube Q0600   metoprolol tartrate  25 mg Per Tube Q6H   multivitamin with minerals  1 tablet Oral Daily   pramipexole  2 mg Per Tube QHS   primidone  250 mg Per Tube BID   sodium zirconium cyclosilicate  10 g Per Tube Daily   vancomycin  500 mg Per Tube BID   Continuous Infusions:  cefTRIAXone (ROCEPHIN)  IV 1 g (04/10/23 1715)   PRN Meds: acetaminophen, haloperidol, ipratropium-albuterol, ondansetron, mouth rinse   Vital Signs    Vitals:   04/11/23 0510 04/11/23 0630 04/11/23 0853 04/11/23 1140  BP: 118/63 96/61 106/80 110/74  Pulse:      Resp: 20 20 18    Temp: 99 F (37.2 C)  98.4 F (36.9 C)   TempSrc: Oral  Oral   SpO2: 93%  94%   Weight:      Height:        Intake/Output Summary (Last 24 hours) at 04/11/2023 1145 Last data filed at  04/11/2023 0339 Gross per 24 hour  Intake 912 ml  Output 300 ml  Net 612 ml      04/11/2023    3:38 AM 04/10/2023    4:25 AM 04/08/2023    5:00 AM  Last 3 Weights  Weight (lbs) 275 lb 9.2 oz 271 lb 2.7 oz 269 lb 2.9 oz  Weight (kg) 125 kg 123 kg 122.1 kg      Telemetry    Atrial fibrillation rate 1 10-1 20- Personally Reviewed  ECG     - Personally Reviewed  Physical Exam   GEN: No acute distress.   Neck: No JVD Cardiac: Irregularly irregular no murmurs, rubs, or gallops.  Respiratory: Clear to auscultation bilaterally. GI: Soft, nontender, non-distended  MS: No edema; No deformity.,  Amputations Neuro:  Nonfocal  Psych: Normal affect   Labs    High Sensitivity Troponin:  No results for input(s): "TROPONINIHS" in the last 720 hours.   Chemistry Recent Labs  Lab 04/05/23 0506 04/06/23 0649 04/09/23 0457 04/10/23 0357 04/11/23 0435  NA 133*   < > 132* 132* 133*  K 4.8   < >  4.8 4.5 4.8  CL 99   < > 99 101 103  CO2 25   < > 25 25 25   GLUCOSE 219*   < > 174* 135* 208*  BUN 33*   < > 41* 39* 36*  CREATININE 1.08   < > 1.33* 1.25* 1.25*  CALCIUM 8.6*   < > 8.5* 8.2* 8.1*  MG 2.2  --   --   --   --   GFRNONAA >60   < > 58* >60 >60  ANIONGAP 9   < > 8 6 5    < > = values in this interval not displayed.    Lipids No results for input(s): "CHOL", "TRIG", "HDL", "LABVLDL", "LDLCALC", "CHOLHDL" in the last 168 hours.  Hematology Recent Labs  Lab 04/09/23 0457 04/10/23 0357 04/11/23 0435  WBC 16.1* 14.4* 13.3*  RBC 4.14* 3.86* 3.63*  HGB 12.1* 11.6* 10.6*  HCT 36.6* 35.3* 32.5*  MCV 88.4 91.5 89.5  MCH 29.2 30.1 29.2  MCHC 33.1 32.9 32.6  RDW 14.5 14.7 14.5  PLT 334 303 282   Thyroid No results for input(s): "TSH", "FREET4" in the last 168 hours.  BNPNo results for input(s): "BNP", "PROBNP" in the last 168 hours.  DDimer No results for input(s): "DDIMER" in the last 168 hours.   Radiology    No results found.  Cardiac Studies     Patient  Profile     67 y.o. male with a hx of frequent falls, PAD s/p b/l BKA, DM2, afib RVR with prior cardioversion, moderate LVH, CAD with CABG in 2006, HTN, esophageal cancer s/p chemo and G-tube placement, and CKD who is being seen for the further evaluation of aflutter RVR   Assessment & Plan    Atrial flutter with RVR Prior history atrial fibrillation with history of multiple cardioversions This admission, difficult to control rate and rhythm in the setting of inability to perform TEE with underlying esophageal cancer Interruption with anticoagulation end of November now requiring 3 to 4 weeks of uninterrupted anticoagulation  placing cardioversion at the end of December So far has completed 10 days of uninterrupted Eliquis 5 twice daily Did not convert to normal sinus rhythm on amiodarone, also no significant improvement in rate No significant improvement in rate with digoxin Given he is asymptomatic with rate 110-1 20, no change to regiment Plan for cardioversion December 23 or after depending on his disposition and treatment plan   Acute metabolic encephalopathy/delirium Esophageal cancer with G-tube Being treated for C. difficile with chronic diarrhea Appropriate on rounds yesterday and today   Coronary artery disease, history of CABG, PAD status post amputations Continue aspirin, Eliquis, Zetia LDL October 2024 above goal As outpatient consider SGLT2 inhibitor though high risk for UTI  Esophageal cancer Followed by oncology Outlook poor    For questions or updates, please contact [facility] Please consult [email or web address] for contact info under        Signed, Julien Nordmann, MD  04/11/2023, 11:45 AM

## 2023-04-11 NOTE — Progress Notes (Signed)
Physical Therapy Treatment Patient Details Name: [name]. MRN: [id number] DOB: 18-Mar-1956 Today's Date: 04/11/2023   History of Present Illness 67yo male pt admitted for hyperkalemia and perinephric abscess. PMH includes Afib, asthma, CHF, COPD, CAD, depression, PVD, and esophageal cancer. Currently with PEG tube placed. Of note B LE BKA.    PT Comments  Pt was long sitting in bed upon arrival, he is alert but in a down mood. Pt is well known by author from previous admission. His cognition is improving but remains not at his baseline ~ 3 weeks prior. Pt agrees to session but requested not to get OOB to recliner due to lack of sleep over past 3 days. Pt uses slide board and hoyer lift at baseline. Does have BLE prosthetics but not here in hospital. He tolerated sitting EOB x ~ 10 minutes prior to returning to long sitting in bed. Overall tolerated session well. Remains tachycardic throughout session however Mds aware and cleared to participate. Pt is not at baseline but wanting to return home when cleared medically. Acute PT will continue efforts per current POC.    If plan is discharge home, recommend the following: Two people to help with walking and/or transfers;Assistance with cooking/housework;Direct supervision/assist for medications management;Direct supervision/assist for financial management;Assist for transportation;Help with stairs or ramp for entrance;Supervision due to cognitive status;A lot of help with bathing/dressing/bathroom     Equipment Recommendations  Hospital bed (pt + spouse have refused hospital beds in the past however continues to recommended)       Precautions / Restrictions Precautions Precautions: Fall Precaution Comments: BLE BKA, no prosthetics in room Restrictions Weight Bearing Restrictions Per Provider Order: No     Mobility  Bed Mobility Overal bed mobility: Needs Assistance Bed Mobility: Supine to Sit, Sit to Supine  Supine to sit: HOB  elevated, Used rails, Mod assist, Max assist Sit to supine: Max assist, Used rails  Transfers    General transfer comment: pt endorses lack of sleep over past 3 days and requested not to get into recliner.      Balance Overall balance assessment: Needs assistance Sitting-balance support: Feet unsupported, No upper extremity supported, Single extremity supported Sitting balance-Leahy Scale: Fair  Standing balance comment: Not formally tested    Cognition Arousal: Alert Behavior During Therapy: WFL for tasks assessed/performed, Anxious Overall Cognitive Status: Impaired/Different from baseline      General Comments: Pt is alert and conversational. oriented x 2 but seemed overall better cognitively but remains far from baseline               Pertinent Vitals/Pain Pain Assessment Pain Assessment: No/denies pain Pain Score: 0-No pain     PT Goals (current goals can now be found in the care plan section) Acute Rehab PT Goals Patient Stated Goal: get home Progress towards PT goals: Progressing toward goals    Frequency    Min 1X/week       AM-PAC PT "6 Clicks" Mobility   Outcome Measure  Help needed turning from your back to your side while in a flat bed without using bedrails?: A Lot Help needed moving from lying on your back to sitting on the side of a flat bed without using bedrails?: A Lot Help needed moving to and from a bed to a chair (including a wheelchair)?: Total Help needed standing up from a chair using your arms (e.g., wheelchair or bedside chair)?: Total Help needed to walk in hospital room?: Total Help needed climbing 3-5 steps with  a railing? : Total 6 Click Score: 8    End of Session   Activity Tolerance: Patient tolerated treatment well Patient left: in bed;with call bell/phone within reach;with bed alarm set Nurse Communication: Mobility status PT Visit Diagnosis: Muscle weakness (generalized) (M62.81);Other abnormalities of gait and mobility  (R26.89)     Time: 3244-0102 PT Time Calculation (min) (ACUTE ONLY): 14 min  Charges:    $Therapeutic Activity: 8-22 mins PT General Charges $$ ACUTE PT VISIT: 1 Visit                    Jetta Lout PTA 04/11/23, 12:25 PM

## 2023-04-11 NOTE — Consult Note (Addendum)
[facility] CONSULT NOTE  Patient Care Team: [name], MD as PCP - General (Internal Medicine) [name], MD as PCP - Cardiology (Cardiology) [name], MD as Consulting Physician (Oncology)  CHIEF COMPLAINTS/PURPOSE OF CONSULTATION: esophagus cancer  HISTORY OF PRESENTING ILLNESS:  [name]. 67 y.o.  male pleasant patient with multiple medical problems including CHF COPD CAD A-fib on Eliquis; diabetes on insulin also PVD status post bilateral BKA-and newly diagnosed adenocarcinoma of esophagus.  Patient was recently diagnosed with adenocarcinoma Status post endoscopy approximately 3 -4 weeks ago or so when he presented with difficulty swallowing/food impaction.  Patient underwent PEG tube placement discharged home on tube feeds.  However patient was subsequently admitted to hospital for severe hyperglycemia hyperkalemia and failure.  Also given concerns of right perinephric abscess treated with antibiotics.  Subsequently patient's stay has been complicated with C. Difficile.  Currently on p.o. vancomycin.  However patient also underwent flexible sigmoidoscopy for ongoing diarrhea yesterday.  Unfortunately because of complications/inpatient admissions patient's chemoradiation therapy for his newly diagnosed complete carcinoma is on hold.  Oncology has been consulted for further evaluation and recommendations.  Review of Systems  Constitutional:  Positive for malaise/fatigue. Negative for chills, diaphoresis, fever and weight loss.  HENT:  Negative for nosebleeds and sore throat.   Eyes:  Negative for double vision.  Respiratory:  Negative for cough, hemoptysis, sputum production, shortness of breath and wheezing.   Cardiovascular:  Negative for chest pain, palpitations, orthopnea and leg swelling.  Gastrointestinal:  Positive for nausea. Negative for abdominal pain, blood in stool, constipation, diarrhea, heartburn, melena and vomiting.   Genitourinary:  Negative for dysuria, frequency and urgency.  Musculoskeletal:  Positive for back pain and joint pain.  Skin: Negative.  Negative for itching and rash.  Neurological:  Negative for dizziness, tingling, focal weakness, weakness and headaches.  Endo/Heme/Allergies:  Does not bruise/bleed easily.  Psychiatric/Behavioral:  Negative for depression. The patient is not nervous/anxious and does not have insomnia.     MEDICAL HISTORY:  Past Medical History:  Diagnosis Date   Arrhythmia    atrial fibrillation   Asthma    CHF (congestive heart failure) (HCC)    COPD (chronic obstructive pulmonary disease) (HCC)    Coronary artery disease    Depression    Diabetes mellitus without complication (HCC)    Gout    History anabolic steroid use    Hyperlipidemia    Hypertension    Hypogonadism in male    MI (myocardial infarction) (HCC)    Morbid obesity (HCC)    Myocardial infarction (HCC)    Peripheral vascular disease (HCC)    Perirectal abscess    Pleurisy    Sleep apnea    CPAP at night, no oxygen   Varicella     SURGICAL HISTORY: Past Surgical History:  Procedure Laterality Date   ABDOMINAL AORTIC ANEURYSM REPAIR     ACHILLES TENDON SURGERY Left 01/10/2021   Procedure: ACHILLES LENGTHENING/KIDNER;  Surgeon: Rosetta Posner, DPM;  Location: [facility] ORS;  Service: Podiatry;  Laterality: Left;   AMPUTATION Left 10/03/2021   Procedure: AMPUTATION BELOW KNEE;  Surgeon: Renford Dills, MD;  Location: [facility] ORS;  Service: Vascular;  Laterality: Left;   AMPUTATION Right 05/24/2022   Procedure: AMPUTATION BELOW KNEE;  Surgeon: Annice Needy, MD;  Location: [facility];  Service: General;  Laterality: Right;   AMPUTATION TOE Right 02/10/2016   Procedure: AMPUTATION TOE 3RD TOE;  Surgeon: Gwyneth Revels, DPM;  Location: [facility];  Service: Podiatry;  Laterality: Right;   AMPUTATION TOE Left 02/24/2020   Procedure: AMPUTATION TOE;  Surgeon: Rosetta Posner, DPM;  Location: [facility];   Service: Podiatry;  Laterality: Left;   APPLICATION OF WOUND VAC Left 02/29/2020   Procedure: APPLICATION OF WOUND VAC;  Surgeon: Rosetta Posner, DPM;  Location: [facility];  Service: Podiatry;  Laterality: Left;   BIOPSY  03/15/2023   Procedure: BIOPSY;  Surgeon: Wyline Mood, MD;  Location: [facility] ENDOSCOPY;  Service: Gastroenterology;;   BIOPSY  04/10/2023   Procedure: BIOPSY;  Surgeon: Norma Fredrickson, Boykin Nearing, MD;  Location: [facility] ENDOSCOPY;  Service: Gastroenterology;;   CARDIAC CATHETERIZATION     CARDIOVERSION [name] 02/13/2022   Procedure: CARDIOVERSION;  Surgeon: Lamar Blinks, MD;  Location: [facility];  Service: Cardiovascular;  Laterality: [name];   COLONOSCOPY WITH PROPOFOL [name] 11/18/2015   Procedure: COLONOSCOPY WITH PROPOFOL;  Surgeon: Scot Jun, MD;  Location: [facility] ENDOSCOPY;  Service: Endoscopy;  Laterality: [name];   CORONARY ARTERY BYPASS GRAFT     CORONARY STENT INTERVENTION [name] 02/02/2020   Procedure: CORONARY STENT INTERVENTION;  Surgeon: Marcina Millard, MD;  Location: [facility] INVASIVE CV LAB;  Service: Cardiovascular;  Laterality: [name];   ESOPHAGOGASTRODUODENOSCOPY (EGD) WITH PROPOFOL [name] 03/15/2023   Procedure: ESOPHAGOGASTRODUODENOSCOPY (EGD) WITH PROPOFOL;  Surgeon: Wyline Mood, MD;  Location: [facility] ENDOSCOPY;  Service: Gastroenterology;  Laterality: [name];   FLEXIBLE SIGMOIDOSCOPY [name] 04/10/2023   Procedure: FLEXIBLE SIGMOIDOSCOPY;  Surgeon: Toledo, Boykin Nearing, MD;  Location: [facility] ENDOSCOPY;  Service: Gastroenterology;  Laterality: [name];   GASTROSTOMY [name] 03/18/2023   Procedure: INSERTION OF GASTROSTOMY TUBE;  Surgeon: Carolan Shiver, MD;  Location: [facility];  Service: General;  Laterality: [name];   IMPACTION REMOVAL  03/15/2023   Procedure: IMPACTION REMOVAL;  Surgeon: Wyline Mood, MD;  Location: [facility] ENDOSCOPY;  Service: Gastroenterology;;   INCISION AND DRAINAGE Left 08/07/2021   Procedure: INCISION AND DRAINAGE-Partial Calcanectomy;  Surgeon: Rosetta Posner, DPM;  Location:  [facility] ORS;  Service: Podiatry;  Laterality: Left;   IRRIGATION AND DEBRIDEMENT FOOT Left 02/29/2020   Procedure: IRRIGATION AND DEBRIDEMENT FOOT;  Surgeon: Rosetta Posner, DPM;  Location: [facility];  Service: Podiatry;  Laterality: Left;   IRRIGATION AND DEBRIDEMENT FOOT Left 02/24/2020   Procedure: IRRIGATION AND DEBRIDEMENT FOOT;  Surgeon: Rosetta Posner, DPM;  Location: [facility];  Service: Podiatry;  Laterality: Left;   KNEE ARTHROSCOPY     LEFT HEART CATH AND CORS/GRAFTS ANGIOGRAPHY [name] 02/02/2020   Procedure: LEFT HEART CATH AND CORS/GRAFTS ANGIOGRAPHY;  Surgeon: Dalia Heading, MD;  Location: [facility] INVASIVE CV LAB;  Service: Cardiovascular;  Laterality: [name];   LOWER EXTREMITY ANGIOGRAPHY Left 02/25/2020   Procedure: Lower Extremity Angiography;  Surgeon: Annice Needy, MD;  Location: [facility] INVASIVE CV LAB;  Service: Cardiovascular;  Laterality: Left;   LOWER EXTREMITY ANGIOGRAPHY Left 01/04/2021   Procedure: LOWER EXTREMITY ANGIOGRAPHY;  Surgeon: Annice Needy, MD;  Location: [facility]IVE CV LAB;  Service: Cardiovascular;  Laterality: Left;   LOWER EXTREMITY ANGIOGRAPHY Right 05/21/2022   Procedure: Lower Extremity Angiography;  Surgeon: Annice Needy, MD;  Location: [facility] LAB;  Service: Cardiovascular;  Laterality: Right;   METATARSAL HEAD EXCISION Left 01/10/2021   Procedure: METATARSAL HEAD EXCISION - LEFT 5th;  Surgeon: Rosetta Posner, DPM;  Location: [facility];  Service: Podiatry;  Laterality: Left;   PERIPHERAL VASCULAR CATHETERIZATION Right 01/24/2016   Procedure: Lower Extremity Angiography;  Surgeon: Renford Dills, MD;  Location: [facility] CV LAB;  Service: Cardiovascular;  Laterality: Right;   PERIPHERAL VASCULAR CATHETERIZATION Right 01/25/2016   Procedure: Lower Extremity Angiography;  Surgeon: Renford Dills, MD;  Location: [facility] CV LAB;  Service: Cardiovascular;  Laterality: Right;   PORTACATH PLACEMENT [name] 03/18/2023   Procedure: INSERTION PORT-A-CATH;   Surgeon: Carolan Shiver, MD;  Location: [facility] OR[facility];  Service: General;  Laterality: [name];   TOE AMPUTATION     TONSILLECTOMY      SOCIAL HISTORY: Social History   Socioeconomic History   Marital status: Married    Spouse name: Not on file   Number of children: Not on file   Years of education: Not on file   Highest education level: Not on file  Occupational History   Not on file  Tobacco Use   Smoking status: Former    Current packs/day: 0.00    Average packs/day: 0.5 packs/day for 45.0 years (22.5 ttl pk-yrs)    Types: Cigarettes    Start date: 04/04/1970    Quit date: 04/05/2015    Years since quitting: 8.0   Smokeless tobacco: Never  Vaping Use   Vaping status: Every Day  Substance and Sexual Activity   Alcohol use: Not Currently    Alcohol/week: 3.0 standard drinks of alcohol    Types: 3 Glasses of wine per week   Drug use: No   Sexual activity: Not on file  Other Topics Concern   Not on file  Social History Narrative   Not on file   Social Drivers of Health   Financial Resource Strain: Low Risk  (09/28/2022)   [facility] from [facility], [facility]   Overall Financial Resource Strain (CARDIA)    Difficulty of Paying Living Expenses: Not hard at all  Food Insecurity: No Food Insecurity (03/29/2023)   Hunger Vital Sign    Worried About Running Out of Food in the Last Year: Never true    Ran Out of Food in the Last Year: Never true  Transportation Needs: No Transportation Needs (03/29/2023)   PRAPARE - Administrator, Civil Service (Medical): No    Lack of Transportation (Non-Medical): No  Physical Activity: Not on file  Stress: Not on file  Social Connections: Not on file  Intimate Partner Violence: Not At Risk (03/29/2023)   Humiliation, Afraid, Rape, and Kick questionnaire    Fear of Current or Ex-Partner: No    Emotionally Abused: No    Physically Abused: No    Sexually Abused: No    FAMILY  HISTORY: Family History  Problem Relation Age of Onset   Hypertension Father    Coronary artery disease Father    Alcohol abuse Father    Heart failure Brother     ALLERGIES:  is allergic to penicillins, statins, and metformin and related.  MEDICATIONS:  Current Facility-Administered Medications  Medication Dose Route Frequency Provider Last Rate Last Admin   acetaminophen (TYLENOL) tablet 650 mg  650 mg Oral Q6H PRN Enedina Finner, MD   650 mg at 04/04/23 0706   apixaban (ELIQUIS) tablet 5 mg  5 mg Per Tube BID Enedina Finner, MD   5 mg at 04/11/23 1003   ascorbic acid (VITAMIN C) tablet 500 mg  500 mg Per Tube Daily Enedina Finner, MD   500 mg at 04/11/23 1003   aspirin chewable tablet 81 mg  81 mg Per Tube Daily Rosezetta Schlatter T, MD   81 mg at 04/11/23 1004   cefTRIAXone (ROCEPHIN) 1 g in sodium chloride 0.9 %  100 mL IVPB  1 g Intravenous Q24H Lolita Patella B, MD 200 mL/hr at 04/11/23 1445 1 g at 04/11/23 1445   Chlorhexidine Gluconate Cloth 2 % PADS 6 each  6 each Topical Daily Loyce Dys, MD   6 each at 04/11/23 1023   cholecalciferol (VITAMIN D3) 25 MCG (1000 UNIT) tablet 2,000 Units  2,000 Units Per Tube Daily Enedina Finner, MD   2,000 Units at 04/11/23 1004   clonazePAM (KLONOPIN) tablet 0.5 mg  0.5 mg Per Tube BID Rosezetta Schlatter T, MD   0.5 mg at 04/11/23 1003   cyanocobalamin (VITAMIN B12) tablet 10,000 mcg  10,000 mcg Per Tube Daily Barrie Folk, RPH   10,000 mcg at 04/11/23 1004   diltiazem (CARDIZEM) tablet 60 mg  60 mg Oral Q6H Antonieta Iba, MD   60 mg at 04/11/23 1721   ezetimibe (ZETIA) tablet 10 mg  10 mg Per Tube Daily Enedina Finner, MD   10 mg at 04/11/23 1003   feeding supplement (GLUCERNA 1.5 CAL) liquid 356 mL  356 mL Per Tube TID PC & HS Rosezetta Schlatter T, MD   356 mL at 04/11/23 1800   ferrous sulfate 220 (44 Fe) MG/5ML solution 300 mg  300 mg Per Tube Daily Enedina Finner, MD   300 mg at 04/11/23 1004   fiber supplement (BANATROL TF) liquid 60 mL  60 mL Per Tube BID  Kathrynn Running, MD   60 mL at 04/11/23 1023   free water 200 mL  200 mL Per Tube TID PC & HS Wouk, Wilfred Curtis, MD   200 mL at 04/11/23 1737   haloperidol (HALDOL) tablet 5 mg  5 mg Per Tube Q6H PRN Kathrynn Running, MD   5 mg at 04/09/23 0014   insulin aspart (novoLOG) injection 0-5 Units  0-5 Units Subcutaneous QHS Rosezetta Schlatter T, MD   3 Units at 04/08/23 2226   insulin aspart (novoLOG) injection 0-9 Units  0-9 Units Subcutaneous TID WC Loyce Dys, MD   2 Units at 04/11/23 1721   insulin glargine-yfgn (SEMGLEE) injection 25 Units  25 Units Subcutaneous QHS Kathrynn Running, MD   25 Units at 04/10/23 2253   ipratropium-albuterol (DUONEB) 0.5-2.5 (3) MG/3ML nebulizer solution 3 mL  3 mL Nebulization Q4H PRN Loyce Dys, MD       levothyroxine (SYNTHROID) tablet 25 mcg  25 mcg Per Tube [id number] Loyce Dys, MD   25 mcg at 04/11/23 0513   metoprolol tartrate (LOPRESSOR) tablet 25 mg  25 mg Per Tube Q6H Jodelle Red, MD   25 mg at 04/11/23 1721   multivitamin with minerals tablet 1 tablet  1 tablet Oral Daily Enedina Finner, MD   1 tablet at 04/11/23 1004   ondansetron (ZOFRAN-ODT) disintegrating tablet 4 mg  4 mg Oral Q6H PRN Kathrynn Running, MD   4 mg at 04/04/23 1914   Oral care mouth rinse  15 mL Mouth Rinse PRN Wouk, Wilfred Curtis, MD       pramipexole (MIRAPEX) tablet 2 mg  2 mg Per Tube QHS Rosezetta Schlatter T, MD   2 mg at 04/10/23 2254   primidone (MYSOLINE) tablet 250 mg  250 mg Per Tube BID Enedina Finner, MD   250 mg at 04/11/23 1004   sodium zirconium cyclosilicate (LOKELMA) packet 10 g  10 g Per Tube Daily Kathrynn Running, MD   10 g at 04/11/23 1024   vancomycin (VANCOCIN) 50 mg/mL oral  solution SOLN 500 mg  500 mg Per Tube BID Wylie Hail M, PA-C   500 mg at 04/11/23 1004    PHYSICAL EXAMINATION:   Vitals:   04/11/23 1655 04/11/23 1937  BP: 112/73   Pulse: (!) 117 (!) 119  Resp:    Temp: 97.7 F (36.5 C) 97.9 F (36.6 C)  SpO2:  98%   Filed Weights    04/08/23 0500 04/10/23 0425 04/11/23 0338  Weight: 269 lb 2.9 oz (122.1 kg) 271 lb 2.7 oz (123 kg) 275 lb 9.2 oz (125 kg)   Bilateral BKA; PEG tube in place  Physical Exam Vitals and nursing note reviewed.  HENT:     Head: Normocephalic and atraumatic.     Mouth/Throat:     Pharynx: Oropharynx is clear.  Eyes:     Extraocular Movements: Extraocular movements intact.     Pupils: Pupils are equal, round, and reactive to light.  Cardiovascular:     Rate and Rhythm: Normal rate and regular rhythm.  Pulmonary:     Comments: Decreased breath sounds bilaterally.  Abdominal:     Palpations: Abdomen is soft.  Musculoskeletal:        General: Normal range of motion.     Cervical back: Normal range of motion.  Skin:    General: Skin is warm.  Neurological:     General: No focal deficit present.     Mental Status: He is alert and oriented to person, place, and time.  Psychiatric:        Behavior: Behavior normal.        Judgment: Judgment normal.     LABORATORY DATA:  I have reviewed the data as listed Lab Results  Component Value Date   WBC 13.3 (H) 04/11/2023   HGB 10.6 (L) 04/11/2023   HCT 32.5 (L) 04/11/2023   MCV 89.5 04/11/2023   PLT 282 04/11/2023   Recent Labs    05/18/22 1527 05/19/22 0531 03/14/23 1618 03/15/23 0402 03/28/23 0011 03/28/23 0240 04/09/23 0457 04/10/23 0357 04/11/23 0435  NA 134*   < > 137   < > 133*   < > 132* 132* 133*  K 5.6*   < > 4.4   < > 6.5*   < > 4.8 4.5 4.8  CL 98   < > 98   < > 94*   < > 99 101 103  CO2 28   < > 30   < > 30   < > 25 25 25   GLUCOSE 336*   < > 193*   < > 252*   < > 174* 135* 208*  BUN 31*   < > 25*   < > 61*   < > 41* 39* 36*  CREATININE 1.30*   < > 1.27*   < > 1.71*   < > 1.33* 1.25* 1.25*  CALCIUM 9.0   < > 8.9   < > 8.6*   < > 8.5* 8.2* 8.1*  GFRNONAA >60   < > >60   < > 43*   < > 58* >60 >60  PROT 7.4  --  7.3  --  6.5  --   --   --   --   ALBUMIN 2.6*  --  3.7  --  2.8*  --   --   --   --   AST 18  --  15   --  14*  --   --   --   --  ALT 25  --  19  --  18  --   --   --   --   ALKPHOS 170*  --  122  --  120  --   --   --   --   BILITOT 0.3  --  0.4  --  0.4  --   --   --   --    < > = values in this interval not displayed.    RADIOGRAPHIC STUDIES: I have personally reviewed the radiological images as listed and agreed with the findings in the report. CT ABDOMEN PELVIS W CONTRAST Result Date: 03/28/2023 CLINICAL DATA:  67 year old male with history of diarrhea. History of G-tube placed on 03/18/2023. History of esophageal neoplasm. * Tracking Code: BO * EXAM: CT ABDOMEN AND PELVIS WITH CONTRAST TECHNIQUE: Multidetector CT imaging of the abdomen and pelvis was performed using the standard protocol following bolus administration of intravenous contrast. RADIATION DOSE REDUCTION: This exam was performed according to the departmental dose-optimization program which includes automated exposure control, adjustment of the mA and/or kV according to patient size and/or use of iterative reconstruction technique. CONTRAST:  80mL OMNIPAQUE IOHEXOL 300 MG/ML  SOLN COMPARISON:  CT of the abdomen and pelvis 03/15/2023. FINDINGS: Lower chest: Circumferential thickening of the distal esophagus again noted, with similar irregularity just above the level of the gastroesophageal junction. Atherosclerotic calcifications in the right coronary artery. Mild scarring in the lung bases bilaterally. Hepatobiliary: No suspicious cystic or solid hepatic lesions. No intra or extrahepatic biliary ductal dilatation. Gallbladder is nearly completely decompressed, but otherwise unremarkable in appearance. Pancreas: No pancreatic mass. No pancreatic ductal dilatation. No pancreatic or peripancreatic fluid collections or inflammatory changes. Spleen: Small capsular calcification associated with the medial aspect of the spleen, likely related to remote trauma. Otherwise, unremarkable. Adrenals/Urinary Tract: Right kidney appears distorted  secondary to enlarging perinephric fluid collection (discussed below). 1.3 cm low-attenuation lesion in the lower pole of the right kidney and 1.8 cm exophytic low-attenuation lesion in the posterior aspect of the interpolar region of the left kidney are compatible with simple cysts (no imaging follow-up recommended). Bilateral adrenal glands are normal in appearance. No hydroureteronephrosis. Urinary bladder is unremarkable in appearance. Stomach/Bowel: Percutaneous gastrostomy tube noted with retention tip in the distal body of the stomach. Trace amount of gas adjacent to this along the greater curvature of the stomach. Stomach is otherwise unremarkable in appearance. No pathologic dilatation of small bowel or colon. Numerous colonic diverticula are noted, without surrounding inflammatory changes to suggest an acute diverticulitis at this time. Normal appendix. Vascular/Lymphatic: Atherosclerosis in the abdominal aorta and pelvic vasculature. No lymphadenopathy noted in the abdomen or pelvis. Reproductive: Prostate gland and seminal vesicles are unremarkable in appearance. Other: Substantial enlargement of an infiltrative appearing right-sided perinephric fluid collection which extends caudally along the right side of the retroperitoneum intimately associated with the right quadratus lumborum musculature in the right psoas musculature. This is irregular in shape and therefore difficult to measure accurately, but is best visualized on axial image 42 of series 2 where this currently measures up to 11.5 x 5.9 cm. This is heterogeneous in attenuation, but demonstrates peripheral enhancement with surrounding inflammatory changes, concerning for enlarging abscess. No significant volume of ascites. Trace amount of pneumoperitoneum most evident in the upper abdomen. Musculoskeletal: There are no aggressive appearing lytic or blastic lesions noted in the visualized portions of the skeleton. IMPRESSION: 1. Percutaneous  gastrostomy tube retention tip appears appropriately located in the stomach. There is  a small volume of pneumoperitoneum, which may be iatrogenic given the recent placement of the tube, although the presence of pneumoperitoneum 10 days postprocedure is unusual. No other source for pneumoperitoneum confidently identified on today's examination. 2. Most notably, however, today's study demonstrates an enlarging right-sided perinephric fluid collection extending caudally in the right-side of the retroperitoneum, highly concerning for enlarging abscess. 3. Persistent circumferential thickening and irregularity of the distal esophagus in this patient with history of esophageal neoplasm. 4. Colonic diverticulosis without evidence of acute diverticulitis at this time. 5. Aortic atherosclerosis, in addition to at least right coronary artery disease. Please note that although the presence of coronary artery calcium documents the presence of coronary artery disease, the severity of this disease and any potential stenosis cannot be assessed on this non-gated CT examination. Assessment for potential risk factor modification, dietary therapy or pharmacologic therapy may be warranted, if clinically indicated. 6. Additional incidental findings, as above. Electronically Signed   By: Trudie Reed M.D.   On: 03/28/2023 06:00   DG Chest 1 View Result Date: 03/28/2023 CLINICAL DATA:  Sepsis, profuse diarrhea.  G-tube placed 03/18/2023. EXAM: CHEST  1 VIEW COMPARISON:  Radiographs 05/18/2022 and CT chest abdomen and pelvis 03/15/2023 FINDINGS: Right chest wall Port-A-Cath tip in the mid SVC. Stable cardiomegaly. Sternotomy. Pulmonary vascular congestion. Left basilar atelectasis. No pleural effusion or pneumothorax. IMPRESSION: No acute cardiopulmonary process Electronically Signed   By: Minerva Fester M.D.   On: 03/28/2023 00:28   DG C-Arm 1-60 Min-No Report Result Date: 03/18/2023 Fluoroscopy was utilized by the requesting  physician.  No radiographic interpretation.   ECHOCARDIOGRAM COMPLETE Result Date: 03/17/2023    ECHOCARDIOGRAM REPORT   Patient Name:   Ami Kumagai. Date of Exam: 03/17/2023 Medical Rec #:  [id number]             Height:       76.0 in Accession #:    [id number]            Weight:       297.6 lb Date of Birth:  01-22-1956            BSA:          2.623 m Patient Age:    67 years              BP:           138/73 mmHg Patient Gender: M                     HR:           76 bpm. Exam Location:  [facility] Procedure: 2D Echo and Strain Analysis Indications:     CAD Native Vessel I25.10  History:         Patient has prior history of Echocardiogram examinations, most                  recent 10/05/2021.  Sonographer:     Overton Mam RDCS, FASE Referring Phys:  [id number] Antonieta Iba Diagnosing Phys: Julien Nordmann MD  Sonographer Comments: Global longitudinal strain was attempted. IMPRESSIONS  1. Left ventricular ejection fraction, by estimation, is 55 %. The left ventricle has normal function. The left ventricle demonstrates regional wall motion abnormalities (mild hypokinesis of the basal inferior wall). There is mild left ventricular hypertrophy. Left ventricular diastolic parameters are consistent with Grade I diastolic dysfunction (impaired relaxation). The average left ventricular global longitudinal strain is -12.0 %.  2. Right  ventricular systolic function is normal. The right ventricular size is normal. Tricuspid regurgitation signal is inadequate for assessing PA pressure.  3. The mitral valve is normal in structure. No evidence of mitral valve regurgitation. No evidence of mitral stenosis.  4. The aortic valve is tricuspid. There is mild calcification of the aortic valve. Aortic valve regurgitation is not visualized. Aortic valve sclerosis/calcification is present, without any evidence of aortic stenosis.  5. The inferior vena cava is normal in size with greater than 50% respiratory variability,  suggesting right atrial pressure of 3 mmHg. FINDINGS  Left Ventricle: Left ventricular ejection fraction, by estimation, is 55 %. Left ventricular ejection fraction by PLAX is 57 %. The left ventricle has normal function. The left ventricle demonstrates regional wall motion abnormalities. The average left ventricular global longitudinal strain is -12.0 %. The left ventricular internal cavity size was normal in size. There is mild left ventricular hypertrophy. Left ventricular diastolic parameters are consistent with Grade I diastolic dysfunction (impaired  relaxation). Right Ventricle: The right ventricular size is normal. No increase in right ventricular wall thickness. Right ventricular systolic function is normal. Tricuspid regurgitation signal is inadequate for assessing PA pressure. Left Atrium: Left atrial size was normal in size. Right Atrium: Right atrial size was normal in size. Pericardium: There is no evidence of pericardial effusion. Mitral Valve: The mitral valve is normal in structure. No evidence of mitral valve regurgitation. No evidence of mitral valve stenosis. Tricuspid Valve: The tricuspid valve is normal in structure. Tricuspid valve regurgitation is mild . No evidence of tricuspid stenosis. Aortic Valve: The aortic valve is tricuspid. There is mild calcification of the aortic valve. Aortic valve regurgitation is not visualized. Aortic valve sclerosis/calcification is present, without any evidence of aortic stenosis. Aortic valve peak gradient measures 11.3 mmHg. Pulmonic Valve: The pulmonic valve was normal in structure. Pulmonic valve regurgitation is not visualized. No evidence of pulmonic stenosis. Aorta: The aortic root is normal in size and structure. Venous: The inferior vena cava is normal in size with greater than 50% respiratory variability, suggesting right atrial pressure of 3 mmHg. IAS/Shunts: No atrial level shunt detected by color flow Doppler.  LEFT VENTRICLE PLAX 2D LV EF:          Left            Diastology                ventricular     LV e' medial:    6.64 cm/s                ejection        LV E/e' medial:  11.2                fraction by     LV e' lateral:   9.79 cm/s                PLAX is 57      LV E/e' lateral: 7.6                %. LVIDd:         5.30 cm         2D LVIDs:         3.70 cm         Longitudinal LV PW:         1.40 cm         Strain LV IVS:        1.40 cm  2D Strain GLS  -12.0 % LVOT diam:     2.35 cm         Avg: LV SV:         83 LV SV Index:   32 LVOT Area:     4.34 cm  RIGHT VENTRICLE RV Basal diam:  3.80 cm RV S prime:     9.57 cm/s TAPSE (M-mode): 1.8 cm LEFT ATRIUM             Index        RIGHT ATRIUM           Index LA diam:        3.80 cm 1.45 cm/m   RA Area:     19.30 cm LA Vol (A2C):   64.8 ml 24.71 ml/m  RA Volume:   56.30 ml  21.46 ml/m LA Vol (A4C):   46.0 ml 17.54 ml/m LA Biplane Vol: 59.6 ml 22.72 ml/m  AORTIC VALVE                 PULMONIC VALVE AV Area (Vmax): 2.27 cm     PV Vmax:        0.87 m/s AV Vmax:        168.00 cm/s  PV Peak grad:   3.0 mmHg AV Peak Grad:   11.3 mmHg    RVOT Peak grad: 4 mmHg LVOT Vmax:      87.80 cm/s LVOT Vmean:     58.600 cm/s LVOT VTI:       0.192 m  AORTA Ao Root diam: 3.70 cm Ao Asc diam:  3.10 cm MITRAL VALVE MV Area (PHT): 3.54 cm    SHUNTS MV Decel Time: 214 msec    Systemic VTI:  0.19 m MV E velocity: 74.40 cm/s  Systemic Diam: 2.35 cm MV A velocity: 97.50 cm/s MV E/A ratio:  0.76 Julien Nordmann MD Electronically signed by Julien Nordmann MD Signature Date/Time: 03/17/2023/2:49:50 PM    Final    CT CHEST ABDOMEN PELVIS W CONTRAST Result Date: 03/16/2023 CLINICAL DATA:  67 year old male with history of esophageal cancer. * Tracking Code: BO * EXAM: CT CHEST, ABDOMEN, AND PELVIS WITH CONTRAST TECHNIQUE: Multidetector CT imaging of the chest, abdomen and pelvis was performed following the standard protocol during bolus administration of intravenous contrast. RADIATION DOSE REDUCTION: This exam was  performed according to the departmental dose-optimization program which includes automated exposure control, adjustment of the mA and/or kV according to patient size and/or use of iterative reconstruction technique. CONTRAST:  OMNIPAQUE IOHEXOL 300 MG/ML  SOLN COMPARISON:  CT of the abdomen and pelvis 08/13/2022. Chest CTA 09/10/2013. FINDINGS: CT CHEST FINDINGS Cardiovascular: Heart size is normal. There is no significant pericardial fluid, thickening or pericardial calcification. Atherosclerotic calcifications are noted in the thoracic aorta as well as the left anterior descending, left circumflex and right coronary arteries. Status post median sternotomy for CABG including LIMA to the LAD. Mediastinum/Nodes: No pathologically enlarged mediastinal or hilar lymph nodes. Extensive thickening of the distal esophagus measuring up to 1.7 cm in thickness shortly before the gastroesophageal junction where there is also haziness in the paraesophageal fat of the middle mediastinum, concerning for potential esophageal neoplasm. No axillary lymphadenopathy. Lungs/Pleura: No acute consolidative airspace disease. No pleural effusions. No definite suspicious appearing pulmonary nodules or masses are noted. Scattered areas of mild scarring are noted in the lung bases bilaterally. Musculoskeletal: Median sternotomy wires. There are no aggressive appearing lytic or blastic lesions noted in the visualized portions  of the skeleton. CT ABDOMEN PELVIS FINDINGS Hepatobiliary: No suspicious cystic or solid hepatic lesions. No intra or extrahepatic biliary ductal dilatation. Gallbladder is unremarkable in appearance. Pancreas: No pancreatic mass. No pancreatic ductal dilatation. No pancreatic or peripancreatic fluid collections or inflammatory changes. Spleen: Unremarkable. Adrenals/Urinary Tract: Exophytic 1.7 cm low-attenuation lesion in the posterior aspect of the interpolar region of the left kidney, compatible with a simple  cyst. Centrally low-attenuation peripherally thick walled right-sided perinephric fluid collection (axial image 84 of series 2) measuring 5.0 x 3.5 cm, tracking caudally along the right retroperitoneum anterior to the iliopsoas musculature, likely a chronic and unresolved hematoma, although abscess is not entirely excluded. The inferior aspect of this appears larger than prior studies (axial image 91 of series 2) measuring up to 6.6 x 2.9 cm just below the lower pole of the right kidney. No hydroureteronephrosis. Urinary bladder is unremarkable in appearance. Mild nodular thickening of the adrenal glands bilaterally, stable compared to the prior study and nonspecific. Stomach/Bowel: Stomach is nearly completely decompressed, but otherwise unremarkable in appearance. No pathologic dilatation of small bowel or colon. Numerous colonic diverticula are noted, without surrounding inflammatory changes to suggest an acute diverticulitis at this time. Normal appendix. Vascular/Lymphatic: Atherosclerosis in the abdominal aorta and pelvic vasculature, without evidence of aneurysm or dissection in the abdominal or pelvic vasculature. Multiple prominent borderline enlarged retroperitoneal lymph nodes, nonspecific. No definite pathologically enlarged lymph nodes in the abdomen or pelvis. Reproductive: Prostate gland and seminal vesicles are unremarkable in appearance. Other: No significant volume of ascites.  No pneumoperitoneum. Musculoskeletal: There are no aggressive appearing lytic or blastic lesions noted in the visualized portions of the skeleton. IMPRESSION: 1. Circumferential mass-like thickening of the distal esophagus corresponding to reported esophageal neoplasm. Haziness in the paraesophageal fat could reflect early local invasion. No definitive evidence of metastatic disease confidently identified. 2. Chronic perinephric fluid collection adjacent to the right kidney appears larger than the prior examination, now  extending caudally in the retroperitoneum. Whether this represents a chronic unresolved hematoma or abscess is uncertain. No internal gas to clearly indicate infection at this time. Further clinical evaluation is recommended. 3. Aortic atherosclerosis, in addition to three-vessel coronary artery disease. Status post median sternotomy for CABG including LIMA to the LAD. 4. Colonic diverticulosis without evidence of acute diverticulitis at this time. 5. Additional incidental findings, as above. Electronically Signed   By: Trudie Reed M.D.   On: 03/16/2023 08:00   DG ESOPHAGUS W DOUBLE CM (HD) Result Date: 03/14/2023 CLINICAL DATA:  Provided history: Essential hypertension. Dysphagia, unspecified type. Additional history: the patient reports dysphagia and vomiting for two weeks with 20 pound weight loss in this time. EXAM: ESOPHAGUS/BARIUM SWALLOW TECHNIQUE: A single contrast examination was performed using thin liquid barium. The exam was performed by Pattricia Boss, PA-C and was supervised and interpreted by Dr. Jackey Loge. FLUOROSCOPY: Radiation Exposure Index (as provided by the fluoroscopic device): 37.70 mGy Kerma COMPARISON:  CT abdomen/pelvis 08/13/2022.  Chest CT 09/10/2013. FINDINGS: Extensive filling defects throughout the mid and distal thoracic esophagus, likely reflecting retained food and pills and markedly limiting mucosal evaluation. Markedly delayed esophageal contrast transit, with only a small amount of contrast passing into the stomach during the examination. Known small hiatal hernia (better appreciated on the prior CT abdomen/pelvis of 08/13/2022). Findings discussed with Dr. Aram Beecham by telephone at 2:40 p.m. on 03/14/2023. The patient was transported directly from the fluoroscopy suite to the emergency department. IMPRESSION: 1. Extensive filling defects throughout the mid and  distal thoracic esophagus, likely reflecting retained food and pills and markedly limiting mucosal  evaluation. An obstructing distal esophageal mass or stricture cannot be excluded. Endoscopy should be considered. 2. Markedly delayed esophageal contrast transit, with only a small amount of contrast passing into the stomach during the examination. 3. Known small hiatal hernia. Electronically Signed   By: Jackey Loge D.O.   On: 03/14/2023 15:38    No problem-specific Assessment & Plan notes found for this encounter.  67 year old patient with multiple medical problems including CHF COPD CAD A-fib on Eliquis; diabetes on insulin also PVD status post bilateral BKA-and newly diagnosed adenocarcinoma of esophagus.  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  Unresectable.   # CAD/A.fib on eliquis  # History of chronic UTI-[history of nitrofurantoin prophylaxis-Dr. Sparks] currently on ceftriaxone.   # Poorly controlled diabetes-on insulin/status post PEG tube  # C. difficile colitis-on oral vancomycin  # Logistical issues with transportation given BKA  Recommendations/plan:  # I had a long discussion with patient and wife regarding the overall poor prognosis of stage IV esophageal cancer, which unfortunately is incurable.  The median life expectancy is around 1 year.  However given patient's multiple co-morbidities/multiple recent admission to hospital -prognosis continues to be extremely poor.   # Given the logistical considerations/patient is likely to proceed with rehab; and then establish care at the cancer center for radiation plus minus chemotherapy.   # Recommend further evaluation with palliative care-also for coordination of care.   # The above plan of care was discussed with patient and his wife in detail.  Also discussed with Josh Borders and Dr. Georgeann Oppenheim.  Above plan of care was discussed with patient/family in detail.  My contact information was given to the patient/family.    [name], MD 04/11/2023 9:54 PM

## 2023-04-11 NOTE — TOC Progression Note (Signed)
Transition of Care (TOC) - Progression Note    Patient Details  Name: [name]. MRN: [id number] Date of Birth: 07-31-1955  Transition of Care [facility] CM/SW Contact  Truddie Hidden, RN Phone Number: 04/11/2023, 10:55 AM  Clinical Narrative:    Attempt to reach patient's wife to give bed offers. No answer. Left a message.         Expected Discharge Plan and Services                                               Social Determinants of Health (SDOH) Interventions SDOH Screenings   Food Insecurity: No Food Insecurity (03/29/2023)  Housing: Low Risk  (03/29/2023)  Transportation Needs: No Transportation Needs (03/29/2023)  Utilities: Not At Risk (03/29/2023)  Depression (PHQ2-9): Low Risk  (11/21/2021)  Financial Resource Strain: Low Risk  (09/28/2022)   Received from [facility], [facility]  Tobacco Use: Medium Risk (04/10/2023)    Readmission Risk Interventions    02/13/2022    4:57 PM 09/29/2021   11:12 AM  Readmission Risk Prevention Plan  Transportation Screening Complete Complete  PCP or Specialist Appt within 3-5 Days  Complete  HRI or Home Care Consult  Complete  Social Work Consult for Recovery Care Planning/Counseling  Complete  Palliative Care Screening  Not Applicable  Medication Review [employer]) Complete Complete  PCP or Specialist appointment within 3-5 days of discharge Complete   HRI or Home Care Consult Complete   SW Recovery Care/Counseling Consult Complete   Palliative Care Screening Not Applicable   Skilled Nursing Facility Not Applicable

## 2023-04-11 NOTE — Plan of Care (Signed)
  Problem: Education: Goal: Knowledge of General Education information will improve Description: Including pain rating scale, medication(s)/side effects and non-pharmacologic comfort measures Outcome: Progressing   Problem: Clinical Measurements: Goal: Will remain free from infection Outcome: Progressing   Problem: Clinical Measurements: Goal: Respiratory complications will improve Outcome: Progressing   Problem: Clinical Measurements: Goal: Cardiovascular complication will be avoided Outcome: Progressing   Problem: Elimination: Goal: Will not experience complications related to urinary retention Outcome: Progressing   Problem: Pain Management: Goal: General experience of comfort will improve Outcome: Progressing   Problem: Safety: Goal: Ability to remain free from injury will improve Outcome: Progressing

## 2023-04-12 DIAGNOSIS — E875 Hyperkalemia: Secondary | ICD-10-CM | POA: Diagnosis not present

## 2023-04-12 LAB — BASIC METABOLIC PANEL
Anion gap: 7 (ref 5–15)
BUN: 31 mg/dL — ABNORMAL HIGH (ref 8–23)
CO2: 22 mmol/L (ref 22–32)
Calcium: 8.4 mg/dL — ABNORMAL LOW (ref 8.9–10.3)
Chloride: 101 mmol/L (ref 98–111)
Creatinine, Ser: 1.04 mg/dL (ref 0.61–1.24)
GFR, Estimated: >60 mL/min (ref >60)
Glucose, Bld: 188 mg/dL — ABNORMAL HIGH (ref 70–99)
Potassium: 5.2 mmol/L — ABNORMAL HIGH (ref 3.5–5.1)
Sodium: 130 mmol/L — ABNORMAL LOW (ref 135–145)

## 2023-04-12 LAB — CBC
HCT: 36.4 % — ABNORMAL LOW (ref 39.0–52.0)
Hemoglobin: 11.6 g/dL — ABNORMAL LOW (ref 13.0–17.0)
MCH: 28.9 pg (ref 26.0–34.0)
MCHC: 31.9 g/dL (ref 30.0–36.0)
MCV: 90.5 fL (ref 80.0–100.0)
Platelets: 285 10*3/uL (ref 150–400)
RBC: 4.02 MIL/uL — ABNORMAL LOW (ref 4.22–5.81)
RDW: 14.6 % (ref 11.5–15.5)
WBC: 11.1 10*3/uL — ABNORMAL HIGH (ref 4.0–10.5)
nRBC: 0 % (ref 0.0–0.2)

## 2023-04-12 LAB — GLUCOSE, CAPILLARY
Glucose-Capillary: 184 mg/dL — ABNORMAL HIGH (ref 70–99)
Glucose-Capillary: 171 mg/dL — ABNORMAL HIGH (ref 70–99)
Glucose-Capillary: 202 mg/dL — ABNORMAL HIGH (ref 70–99)
Glucose-Capillary: 212 mg/dL — ABNORMAL HIGH (ref 70–99)
Glucose-Capillary: 222 mg/dL — ABNORMAL HIGH (ref 70–99)

## 2023-04-12 MED ORDER — ORAL CARE MOUTH RINSE: 15.0000 mL | OROMUCOSAL | Status: DC | PRN

## 2023-04-12 MED ORDER — ORAL CARE MOUTH RINSE
15.0000 mL | OROMUCOSAL | Status: DC
  Administered 2023-04-12 – 2023-04-14 (×8): 15 mL via OROMUCOSAL

## 2023-04-12 NOTE — Plan of Care (Signed)
  Problem: Clinical Measurements: Goal: Will remain free from infection Outcome: Progressing   Problem: Clinical Measurements: Goal: Respiratory complications will improve Outcome: Progressing   Problem: Clinical Measurements: Goal: Cardiovascular complication will be avoided Outcome: Progressing   Problem: Coping: Goal: Level of anxiety will decrease Outcome: Progressing   Problem: Safety: Goal: Ability to remain free from injury will improve Outcome: Progressing

## 2023-04-12 NOTE — Plan of Care (Signed)
  Problem: Education: Goal: Ability to describe self-care measures that may prevent or decrease complications (Diabetes Survival Skills Education) will improve Outcome: Not Progressing Goal: Individualized Educational Video(s) Outcome: Not Progressing   Problem: Coping: Goal: Ability to adjust to condition or change in health will improve Outcome: Not Progressing   Problem: Fluid Volume: Goal: Ability to maintain a balanced intake and output will improve Outcome: Not Progressing   
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
## 2023-04-12 NOTE — Evaluation (Signed)
Clinical/Bedside Swallow Evaluation Patient Details  Name: Tayjon Trettel. MRN: [id number] Date of Birth: [birth year]  Today's Date: 04/12/2023 Time: SLP Start Time (ACUTE ONLY): 1335 SLP Stop Time (ACUTE ONLY): 1349 SLP Time Calculation (min) (ACUTE ONLY): 14 min  Past Medical History:  Past Medical History:  Diagnosis Date   Arrhythmia    atrial fibrillation   Asthma    CHF (congestive heart failure) (HCC)    COPD (chronic obstructive pulmonary disease) (HCC)    Coronary artery disease    Depression    Diabetes mellitus without complication (HCC)    Gout    History anabolic steroid use    Hyperlipidemia    Hypertension    Hypogonadism in male    MI (myocardial infarction) (HCC)    Morbid obesity (HCC)    Myocardial infarction (HCC)    Peripheral vascular disease (HCC)    Perirectal abscess    Pleurisy    Sleep apnea    CPAP at night, no oxygen   Varicella    Past Surgical History:  Past Surgical History:  Procedure Laterality Date   ABDOMINAL AORTIC ANEURYSM REPAIR     ACHILLES TENDON SURGERY Left 01/10/2021   Procedure: ACHILLES LENGTHENING/KIDNER;  Surgeon: Rosetta Posner, DPM;  Location: [facility];  Service: Podiatry;  Laterality: Left;   AMPUTATION Left 10/03/2021   Procedure: AMPUTATION BELOW KNEE;  Surgeon: Renford Dills, MD;  Location: [facility];  Service: Vascular;  Laterality: Left;   AMPUTATION Right 05/24/2022   Procedure: AMPUTATION BELOW KNEE;  Surgeon: Annice Needy, MD;  Location: [facility];  Service: General;  Laterality: Right;   AMPUTATION TOE Right 02/10/2016   Procedure: AMPUTATION TOE 3RD TOE;  Surgeon: Gwyneth Revels, DPM;  Location: [facility];  Service: Podiatry;  Laterality: Right;   AMPUTATION TOE Left 02/24/2020   Procedure: AMPUTATION TOE;  Surgeon: Rosetta Posner, DPM;  Location: [facility];  Service: Podiatry;  Laterality: Left;   APPLICATION OF WOUND VAC Left 02/29/2020   Procedure: APPLICATION OF WOUND VAC;  Surgeon: Rosetta Posner,  DPM;  Location: [facility];  Service: Podiatry;  Laterality: Left;   BIOPSY  03/15/2023   Procedure: BIOPSY;  Surgeon: Wyline Mood, MD;  Location: [facility] ENDOSCOPY;  Service: Gastroenterology;;   BIOPSY  04/10/2023   Procedure: BIOPSY;  Surgeon: Norma Fredrickson, Boykin Nearing, MD;  Location: [facility]OSCOPY;  Service: Gastroenterology;;   CARDIAC CATHETERIZATION     CARDIOVERSION [name] 02/13/2022   Procedure: CARDIOVERSION;  Surgeon: Lamar Blinks, MD;  Location: [facility];  Service: Cardiovascular;  Laterality: [name];   COLONOSCOPY WITH PROPOFOL [name] 11/18/2015   Procedure: COLONOSCOPY WITH PROPOFOL;  Surgeon: Scot Jun, MD;  Location: [facility]COPY;  Service: Endoscopy;  Laterality: [name];   CORONARY ARTERY BYPASS GRAFT     CORONARY STENT INTERVENTION [name] 02/02/2020   Procedure: CORONARY STENT INTERVENTION;  Surgeon: Marcina Millard, MD;  Location: [facility];  Service: Cardiovascular;  Laterality: [name];   ESOPHAGOGASTRODUODENOSCOPY (EGD) WITH PROPOFOL [name] 03/15/2023   Procedure: ESOPHAGOGASTRODUODENOSCOPY (EGD) WITH PROPOFOL;  Surgeon: Wyline Mood, MD;  Location: [facility] ENDOSCOPY;  Service: Gastroenterology;  Laterality: [name];   FLEXIBLE SIGMOIDOSCOPY [name] 04/10/2023   Procedure: FLEXIBLE SIGMOIDOSCOPY;  Surgeon: Toledo, Boykin Nearing, MD;  Location: [facility]OSCOPY;  Service: Gastroenterology;  Laterality: [name];   GASTROSTOMY [name] 03/18/2023   Procedure: INSERTION OF GASTROSTOMY TUBE;  Surgeon: Carolan Shiver, MD;  Location: [facility];  Service: General;  Laterality: [name];   IMPACTION REMOVAL  03/15/2023   Procedure: IMPACTION REMOVAL;  Surgeon: Wyline Mood, MD;  Location: [facility] ENDOSCOPY;  Service: Gastroenterology;;   INCISION AND DRAINAGE Left 08/07/2021   Procedure: INCISION AND DRAINAGE-Partial Calcanectomy;  Surgeon: Rosetta Posner, DPM;  Location: [facility];  Service: Podiatry;  Laterality: Left;   IRRIGATION AND DEBRIDEMENT FOOT Left 02/29/2020   Procedure: IRRIGATION AND DEBRIDEMENT FOOT;   Surgeon: Rosetta Posner, DPM;  Location: [facility];  Service: Podiatry;  Laterality: Left;   IRRIGATION AND DEBRIDEMENT FOOT Left 02/24/2020   Procedure: IRRIGATION AND DEBRIDEMENT FOOT;  Surgeon: Rosetta Posner, DPM;  Location: [facility];  Service: Podiatry;  Laterality: Left;   KNEE ARTHROSCOPY     LEFT HEART CATH AND CORS/GRAFTS ANGIOGRAPHY [name] 02/02/2020   Procedure: LEFT HEART CATH AND CORS/GRAFTS ANGIOGRAPHY;  Surgeon: Dalia Heading, MD;  Location: [facility];  Service: Cardiovascular;  Laterality: [name];   LOWER EXTREMITY ANGIOGRAPHY Left 02/25/2020   Procedure: Lower Extremity Angiography;  Surgeon: Annice Needy, MD;  Location: [facility];  Service: Cardiovascular;  Laterality: Left;   LOWER EXTREMITY ANGIOGRAPHY Left 01/04/2021   Procedure: LOWER EXTREMITY ANGIOGRAPHY;  Surgeon: Annice Needy, MD;  Location: [facility];  Service: Cardiovascular;  Laterality: Left;   LOWER EXTREMITY ANGIOGRAPHY Right 05/21/2022   Procedure: Lower Extremity Angiography;  Surgeon: Annice Needy, MD;  Location: [facility];  Service: Cardiovascular;  Laterality: Right;   METATARSAL HEAD EXCISION Left 01/10/2021   Procedure: METATARSAL HEAD EXCISION - LEFT 5th;  Surgeon: Rosetta Posner, DPM;  Location: [facility];  Service: Podiatry;  Laterality: Left;   PERIPHERAL VASCULAR CATHETERIZATION Right 01/24/2016   Procedure: Lower Extremity Angiography;  Surgeon: Renford Dills, MD;  Location: [facility];  Service: Cardiovascular;  Laterality: Right;   PERIPHERAL VASCULAR CATHETERIZATION Right 01/25/2016   Procedure: Lower Extremity Angiography;  Surgeon: Renford Dills, MD;  Location: [facility];  Service: Cardiovascular;  Laterality: Right;   PORTACATH PLACEMENT [name] 03/18/2023   Procedure: INSERTION PORT-A-CATH;  Surgeon: Carolan Shiver, MD;  Location: [facility];  Service: General;  Laterality: [name];   TOE AMPUTATION     TONSILLECTOMY     HPI:   [name] is a 67 y.o. male with medical history significant of atrial fibrillation, asthma, congestive heart failure, COPD, coronary artery disease, depression, diabetes mellitus, gout, hyperlipidemia, hypertension, hypogonadism in male, morbid obesity, peripheral vascular disease, OSA on CPAP at night, esophageal cancer status postG-tube placement. Pt admitted to [facility] on 03/28/2023 for hyperkalemia, severe dehydration and AKI and acute metabolic encephalopathy.    Assessment / Plan / Recommendation  Clinical Impression  ST services were consulted d/t pt report of safe PO consumption prior to being admitted. A review of pt's chart reveals recent DG esophagus on 03/14/2023 that states "1. Extensive filling defects throughout the mid and distal thoracic  esophagus, likely reflecting retained food and pills and markedly  limiting mucosal evaluation. An obstructing distal esophageal mass  or stricture cannot be excluded. Endoscopy should be considered.  2. Markedly delayed esophageal contrast transit, with only a small  amount of contrast passing into the stomach during the examination.  3. Known small hiatal hernia."   At bedside, SLP presented pt with some ice chips for possible oral comfort and for use of swallow musculature. Pt was perseveartive on fact that his wife had brought him a ribeye steak sandwich and that someone had "stolen it." Pt refused the ice chips d/t fixation on "finding out who stole my sandwich." Per  nursing report, pt's wife has not been in to see pt today.   At this time, pt doesn't have a history of oropharyngeal dysphagia and has been free of any respiratory decline, therefore would defer any conversation regarding PO intake to GI and/or Palliative Care services. ST services to sign off at this time. SLP Visit Diagnosis: Dysphagia, unspecified (R13.10)    Aspiration Risk  Moderate aspiration risk (of post prandial aspiraiton)    Diet Recommendation NPO     Medication Administration: Via alternative means    Other  Recommendations Oral Care Recommendations: Oral care QID    Recommendations for follow up therapy are one component of a multi-disciplinary discharge planning process, led by the attending physician.  Recommendations may be updated based on patient status, additional functional criteria and insurance authorization.  Follow up Recommendations No SLP follow up      Assistance Recommended at Discharge  [name]  Functional Status Assessment Patient has not had a recent decline in their functional status  Frequency and Duration   [name]         Prognosis Prognosis for improved oropharyngeal function: Guarded Barriers to Reach Goals:  (obstructing esophageal mass)      Swallow Study   General Date of Onset: 03/28/23 HPI: Dondi Byram. is a 67 y.o. male with medical history significant of atrial fibrillation, asthma, congestive heart failure, COPD, coronary artery disease, depression, diabetes mellitus, gout, hyperlipidemia, hypertension, hypogonadism in male, morbid obesity, peripheral vascular disease, OSA on CPAP at night, esophageal cancer status postG-tube placement. Pt admitted to [facility] on 03/28/2023 for hyperkalemia, severe dehydration and AKI and acute metabolic encephalopathy. Type of Study: Bedside Swallow Evaluation Previous Swallow Assessment: none in chart Diet Prior to this Study: NPO Temperature Spikes Noted: No Respiratory Status: Room air History of Recent Intubation:  (procedure only) Behavior/Cognition: Alert;Confused;Agitated;Doesn't follow directions Oral Cavity Assessment: Within Functional Limits Oral Care Completed by SLP: No Oral Cavity - Dentition: Adequate natural dentition Vision: Functional for self-feeding Patient Positioning: Upright in bed Baseline Vocal Quality: Normal Volitional Cough: Cognitively unable to elicit Volitional Swallow: Unable to elicit    Oral/Motor/Sensory Function  Overall Oral Motor/Sensory Function: Within functional limits   Ice Chips Ice chips: Not tested   Thin Liquid Thin Liquid: Not tested    Nectar Thick Nectar Thick Liquid: Not tested   Honey Thick Honey Thick Liquid: Not tested   Puree Puree: Not tested   Solid     Solid: Not tested     Taras Rask B. Dreama Saa, M.S., CCC-SLP, Tree surgeon Certified Brain Injury Specialist [facility]  [facility] Rehabilitation Services Office [phone] Ascom [phone] Fax [phone]

## 2023-04-12 NOTE — Care Management Important Message (Signed)
Important Message  Patient Details  Name: [name]. MRN: [id number] Date of Birth: Dec 10, 1955   Important Message Given:  Yes - Medicare IM     Verita Schneiders Jalayna Josten 04/12/2023, 2:02 PM

## 2023-04-12 NOTE — Progress Notes (Signed)
PROGRESS NOTE    Alec Lowry Bowl.  NWG:[id number] DOB: 14-Jul-1955 DOA: 03/28/2023 PCP: [name], MD    Brief Narrative:   Alec Bender. is a 67 y.o. male with medical history significant of atrial fibrillation, asthma, congestive heart failure, COPD, coronary artery disease, depression, diabetes mellitus, gout, hyperlipidemia, hypertension, hypogonadism in male, morbid obesity, peripheral vascular disease, OSA on CPAP at night, esophageal cancer status post chemo and G-tube placement.  Patient currently being managed for hyperkalemia, severe dehydration and AKI and acute metabolic encephalopathy.    Assessment & Plan:   Principal Problem:   Hyperkalemia Active Problems:   Delirium   Acute kidney injury (HCC)   Malnutrition of moderate degree   Acute hyperkalemia   Diarrhea   Perinephric fluid collection   Persistent atrial fibrillation (HCC)   Typical atrial flutter (HCC)   Diarrhea of infectious origin   Atrial flutter with rapid ventricular response (HCC)   Malignant neoplasm of lower third of esophagus (HCC)   Palliative care encounter  Acute kidney injury secondary to dehydration Initially resolved now with up-trending cr in setting of ongoing diarrhea -Creatinine improved on 12/11.  No IV fluids   Delirium Intermittent.  Wife concerned that UTI may be contributing.  Mental status seems okay - haldol prn -Rocephin for UTI   End-of-life care Patient remains a full scope of treatment, full code   Bacteriuria Klebsiella UTI It is possible that Klebsiella in the urine represents chronic bacteria and colonization.  However given mentation altered from baseline it is not unreasonable to give patient a trial of intravenous antibiotics to assess for any improvement in mentation.  If there is any deterioration from a C. difficile standpoint we will need to will hold on UTI treatment. Plan: Rocephin 1 g every 24 hours per sensitivities.  5-day  course  Acute diarrhea secondary to C. difficile infection Continues to have profuse watery BMs multiple times a day Per GI this could be related to tube feeds which can produce 6 watery bowel movements daily Patient is status post 10 days of Dificid.  Vancomycin added by GI on 12/8 Status post flexible sigmoidoscopy on 12/11.  Normal-appearing mucosa.  Biopsies taken.  Diarrhea has been improving as of 12/12 Plan: Continue p.o. vancomycin 500 mg per tube twice daily  Possible right perinephric hematoma --Described as possible abscess by radiology but per secure chat communication w/ Dr. Apolinar Junes on 12/9, this is a chornic complex hematoma dating back over a year, nothing that has been drainable thus far, possible underlying renal mass, and in light of significant comorbidities and as he is asymptomatic inpatient evaluation has been deferred, plan is outpatient urology f/u, consider MRI then.   Severe life-threatening hyperkalemia Has required daily lokelma for the past several days - continue daily lokelma for now.  Potassium 5.2 as of 12/13    Chronic persistent atrial flutter with rvr Cardiology now following. Remains in rvr, hemodynamically stable Plan: On apixaban On dilt and metop currently  Amio infusion discontinued per cardiology Discussed with cardiology.  No plans for cardioversion.   Patient is not far off his goal rate and is relatively asymptomatic   Urinary retention Resolved   Chronic diastolic congestive heart failure --Recent EF of 55% on echo obtained on November 2024   COPD --Continue as needed nebulization    Coronary artery disease,  --Continue aspirin therapy   Diabetes mellitus type 2 with hyperglycemia -- Continue Semglee 25 units nightly (previous dose 35 units)  Hypothyroidism --on levothyroxine     Nutrition Status: Nutrition Problem: Moderate Malnutrition Etiology: chronic illness (esopahgeal mass, likely malginant) Signs/Symptoms: mild fat  depletion, mild muscle depletion Interventions: Tube feeding   Peripheral vascular disease S/p bilateral BKAs --Continue aspirin  --Patient is allergic to statin therapy   OSA on CPAP at night,  --CPAP at night   Esophageal cancer status post chemo and G-tube placement. --pt gets Bolus TF --Restarted post endoscopy   DVT prophylaxis: Apixaban Code Status: Full Family Communication:Spouse [name] [phone] on 12/11, at bedside 12/12 Disposition Plan: Status is: Inpatient Remains inpatient appropriate because: Multiple acute issues as above   Level of care: Progressive  Consultants:  GI Oncology  Procedures:  Flexible sigmoidoscopy  Antimicrobials: Ceftriaxone   Subjective: Seen and examined.  No family at bedside this morning.  Diarrhea improving.  Patient overall stable.  Objective: Vitals:   04/12/23 0138 04/12/23 0548 04/12/23 0646 04/12/23 0855  BP: 102/89 103/70 109/75   Pulse:      Resp: 20 20 20    Temp:  98.8 F (37.1 C)  98.8 F (37.1 C)  TempSrc:  Oral  Oral  SpO2:  97%    Weight:      Height:        Intake/Output Summary (Last 24 hours) at 04/12/2023 1243 Last data filed at 04/11/2023 2352 Gross per 24 hour  Intake --  Output 800 ml  Net -800 ml   Filed Weights   04/08/23 0500 04/10/23 0425 04/11/23 0338  Weight: 122.1 kg 123 kg 125 kg    Examination:  General exam: NAD.  Appears fatigued.  Mentation appears improved Respiratory system: Crackles at right base.  Normal work of breathing.  Room air Cardiovascular system: S1-S2, tachycardic, irregular rhythm, no murmurs  Gastrointestinal system: Soft, nondistended, hyperactive bowel sounds, + PEG Central nervous system: ANO x 2.  No focal deficits Extremities: Bilateral BKA Skin: No rashes, lesions or ulcers Psychiatry: Judgement and insight appear normal. Mood & affect appropriate.     Data Reviewed: I have personally reviewed following labs and imaging studies  CBC: Recent  Labs  Lab 04/08/23 0433 04/09/23 0457 04/10/23 0357 04/11/23 0435 04/12/23 0512  WBC 16.4* 16.1* 14.4* 13.3* 11.1*  HGB 11.8* 12.1* 11.6* 10.6* 11.6*  HCT 36.1* 36.6* 35.3* 32.5* 36.4*  MCV 90.0 88.4 91.5 89.5 90.5  PLT 355 334 303 282 285   Basic Metabolic Panel: Recent Labs  Lab 04/08/23 0433 04/09/23 0457 04/10/23 0357 04/11/23 0435 04/12/23 0512  NA 133* 132* 132* 133* 130*  K 4.9 4.8 4.5 4.8 5.2*  CL 99 99 101 103 101  CO2 25 25 25 25 22   GLUCOSE 190* 174* 135* 208* 188*  BUN 36* 41* 39* 36* 31*  CREATININE 1.25* 1.33* 1.25* 1.25* 1.04  CALCIUM 8.7* 8.5* 8.2* 8.1* 8.4*   GFR: Estimated Creatinine Clearance: 83.6 mL/min (by C-G formula based on SCr of 1.04 mg/dL). Liver Function Tests: No results for input(s): "AST", "ALT", "ALKPHOS", "BILITOT", "PROT", "ALBUMIN" in the last 168 hours. No results for input(s): "LIPASE", "AMYLASE" in the last 168 hours. No results for input(s): "AMMONIA" in the last 168 hours. Coagulation Profile: No results for input(s): "INR", "PROTIME" in the last 168 hours. Cardiac Enzymes: No results for input(s): "CKTOTAL", "CKMB", "CKMBINDEX", "TROPONINI" in the last 168 hours. BNP (last 3 results) No results for input(s): "PROBNP" in the last 8760 hours. HbA1C: No results for input(s): "HGBA1C" in the last 72 hours. CBG: Recent Labs  Lab 04/11/23 1655 04/11/23  1936 04/11/23 2314 04/12/23 0523 04/12/23 0851  GLUCAP 166* 193* 174* 184* 171*   Lipid Profile: No results for input(s): "CHOL", "HDL", "LDLCALC", "TRIG", "CHOLHDL", "LDLDIRECT" in the last 72 hours. Thyroid Function Tests: No results for input(s): "TSH", "T4TOTAL", "FREET4", "T3FREE", "THYROIDAB" in the last 72 hours. Anemia Panel: No results for input(s): "VITAMINB12", "FOLATE", "FERRITIN", "TIBC", "IRON", "RETICCTPCT" in the last 72 hours. Sepsis Labs: Recent Labs  Lab 04/10/23 0357  PROCALCITON 0.19    Recent Results (from the past 240 hours)  MRSA Next Gen by  PCR, Nasal     Status: Abnormal   Collection Time: 04/05/23  7:45 PM   Specimen: Nasal Mucosa; Nasal Swab  Result Value Ref Range Status   MRSA by PCR Next Gen DETECTED (A) NOT DETECTED Final    Comment: CRITICAL RESULT CALLED TO, READ BACK BY AND VERIFIED WITH: Celene Kras RN @2154  04/05/23 LFD (NOTE) The GeneXpert MRSA Assay (FDA approved for NASAL specimens only), is one component of a comprehensive MRSA colonization surveillance program. It is not intended to diagnose MRSA infection nor to guide or monitor treatment for MRSA infections. Test performance is not FDA approved in patients less than 7 years old. Performed at [facility], [address] [place], [place] [address]   Urine Culture     Status: Abnormal   Collection Time: 04/06/23  4:20 PM   Specimen: Urine, Catheterized  Result Value Ref Range Status   Specimen Description   Final    URINE, CATHETERIZED Performed at [facility], [address], [place], [place] [address]    Special Requests   Final    NONE Performed at [facility], [address] [place], [place] [address]    Culture >=100,000 COLONIES/mL KLEBSIELLA PNEUMONIAE (A)  Final   Report Status 04/09/2023 FINAL  Final   Organism ID, Bacteria KLEBSIELLA PNEUMONIAE (A)  Final      Susceptibility   Klebsiella pneumoniae - MIC*    AMPICILLIN >=32 RESISTANT Resistant     CEFAZOLIN <=4 SENSITIVE Sensitive     CEFEPIME <=0.12 SENSITIVE Sensitive     CEFTRIAXONE <=0.25 SENSITIVE Sensitive     CIPROFLOXACIN <=0.25 SENSITIVE Sensitive     GENTAMICIN <=1 SENSITIVE Sensitive     IMIPENEM <=0.25 SENSITIVE Sensitive     NITROFURANTOIN 64 INTERMEDIATE Intermediate     TRIMETH/SULFA <=20 SENSITIVE Sensitive     AMPICILLIN/SULBACTAM 4 SENSITIVE Sensitive     PIP/TAZO <=4 SENSITIVE Sensitive ug/mL    * >=100,000 COLONIES/mL KLEBSIELLA PNEUMONIAE         Radiology Studies: No results found.      Scheduled Meds:   apixaban  5 mg Per Tube BID   ascorbic acid  500 mg Per Tube Daily   aspirin  81 mg Per Tube Daily   Chlorhexidine Gluconate Cloth  6 each Topical Daily   cholecalciferol  2,000 Units Per Tube Daily   clonazePAM  0.5 mg Per Tube BID   cyanocobalamin  10,000 mcg Per Tube Daily   diltiazem  60 mg Oral Q6H   ezetimibe  10 mg Per Tube Daily   feeding supplement (GLUCERNA 1.5 CAL)  356 mL Per Tube TID PC & HS   ferrous sulfate  300 mg Per Tube Daily   fiber supplement (BANATROL TF)  60 mL Per Tube BID   free water  200 mL Per Tube TID PC & HS   insulin aspart  0-5 Units Subcutaneous QHS   insulin aspart  0-9  Units Subcutaneous TID WC   insulin glargine-yfgn  25 Units Subcutaneous QHS   levothyroxine  25 mcg Per Tube Q0600   metoprolol tartrate  25 mg Per Tube Q6H   multivitamin with minerals  1 tablet Oral Daily   pramipexole  2 mg Per Tube QHS   primidone  250 mg Per Tube BID   sodium zirconium cyclosilicate  10 g Per Tube Daily   vancomycin  500 mg Per Tube BID   Continuous Infusions:  cefTRIAXone (ROCEPHIN)  IV 1 g (04/11/23 1445)     LOS: 15 days      Tresa Moore, MD [facility]   If 7PM-7AM, please contact night-coverage  04/12/2023, 12:43 PM

## 2023-04-12 NOTE — Plan of Care (Signed)
  Problem: Education: Goal: Ability to describe self-care measures that may prevent or decrease complications (Diabetes Survival Skills Education) will improve Outcome: Progressing Goal: Individualized Educational Video(s) Outcome: Progressing   Problem: Coping: Goal: Ability to adjust to condition or change in health will improve Outcome: Progressing   Problem: Fluid Volume: Goal: Ability to maintain a balanced intake and output will improve Outcome: Progressing   Problem: Health Behavior/Discharge Planning: Goal: Ability to identify and utilize available resources and services will improve Outcome: Progressing Goal: Ability to manage health-related needs will improve Outcome: Progressing   Problem: Metabolic: Goal: Ability to maintain appropriate glucose levels will improve Outcome: Progressing   Problem: Nutritional: Goal: Maintenance of adequate nutrition will improve Outcome: Progressing Goal: Progress toward achieving an optimal weight will improve Outcome: Progressing   Problem: Skin Integrity: Goal: Risk for impaired skin integrity will decrease Outcome: Progressing   Problem: Tissue Perfusion: Goal: Adequacy of tissue perfusion will improve Outcome: Progressing   Problem: Education: Goal: Knowledge of General Education information will improve Description: Including pain rating scale, medication(s)/side effects and non-pharmacologic comfort measures Outcome: Progressing   Problem: Health Behavior/Discharge Planning: Goal: Ability to manage health-related needs will improve Outcome: Progressing   Problem: Clinical Measurements: Goal: Ability to maintain clinical measurements within normal limits will improve Outcome: Progressing Goal: Will remain free from infection Outcome: Progressing Goal: Diagnostic test results will improve Outcome: Progressing Goal: Respiratory complications will improve Outcome: Progressing Goal: Cardiovascular complication will  be avoided Outcome: Progressing   Problem: Activity: Goal: Risk for activity intolerance will decrease Outcome: Progressing   Problem: Nutrition: Goal: Adequate nutrition will be maintained Outcome: Progressing   Problem: Coping: Goal: Level of anxiety will decrease Outcome: Progressing   Problem: Elimination: Goal: Will not experience complications related to bowel motility Outcome: Progressing Goal: Will not experience complications related to urinary retention Outcome: Progressing   Problem: Pain Management: Goal: General experience of comfort will improve Outcome: Progressing   Problem: Safety: Goal: Ability to remain free from injury will improve Outcome: Progressing   Problem: Skin Integrity: Goal: Risk for impaired skin integrity will decrease Outcome: Progressing   Problem: Safety: Goal: Non-violent Restraint(s) Outcome: Progressing

## 2023-04-13 DIAGNOSIS — E875 Hyperkalemia: Secondary | ICD-10-CM | POA: Diagnosis not present

## 2023-04-13 LAB — CBC
HCT: 35.6 % — ABNORMAL LOW (ref 39.0–52.0)
Hemoglobin: 11.6 g/dL — ABNORMAL LOW (ref 13.0–17.0)
MCH: 29.3 pg (ref 26.0–34.0)
MCHC: 32.6 g/dL (ref 30.0–36.0)
MCV: 89.9 fL (ref 80.0–100.0)
Platelets: 303 10*3/uL (ref 150–400)
RBC: 3.96 MIL/uL — ABNORMAL LOW (ref 4.22–5.81)
RDW: 14.2 % (ref 11.5–15.5)
WBC: 11.3 10*3/uL — ABNORMAL HIGH (ref 4.0–10.5)
nRBC: 0 % (ref 0.0–0.2)

## 2023-04-13 LAB — GLUCOSE, CAPILLARY
Glucose-Capillary: 173 mg/dL — ABNORMAL HIGH (ref 70–99)
Glucose-Capillary: 214 mg/dL — ABNORMAL HIGH (ref 70–99)
Glucose-Capillary: 150 mg/dL — ABNORMAL HIGH (ref 70–99)
Glucose-Capillary: 152 mg/dL — ABNORMAL HIGH (ref 70–99)

## 2023-04-13 LAB — BASIC METABOLIC PANEL
Anion gap: 6 (ref 5–15)
BUN: 32 mg/dL — ABNORMAL HIGH (ref 8–23)
CO2: 23 mmol/L (ref 22–32)
Calcium: 8.3 mg/dL — ABNORMAL LOW (ref 8.9–10.3)
Chloride: 100 mmol/L (ref 98–111)
Creatinine, Ser: 0.91 mg/dL (ref 0.61–1.24)
GFR, Estimated: >60 mL/min (ref >60)
Glucose, Bld: 193 mg/dL — ABNORMAL HIGH (ref 70–99)
Potassium: 4.8 mmol/L (ref 3.5–5.1)
Sodium: 129 mmol/L — ABNORMAL LOW (ref 135–145)

## 2023-04-13 MED ORDER — SODIUM ZIRCONIUM CYCLOSILICATE 5 G PO PACK
5.0000 g | PACK | Freq: Every day | ORAL | Status: DC
  Administered 2023-04-13 – 2023-04-14 (×2): 5 g
  Filled 2023-04-13 (×2): qty 1

## 2023-04-13 NOTE — Progress Notes (Signed)
PROGRESS NOTE    [name].  EAV:[id number] DOB: 02-11-1956 DOA: 03/28/2023 PCP: [name], MD    Brief Narrative:   [name]. is a 67 y.o. male with medical history significant of atrial fibrillation, asthma, congestive heart failure, COPD, coronary artery disease, depression, diabetes mellitus, gout, hyperlipidemia, hypertension, hypogonadism in male, morbid obesity, peripheral vascular disease, OSA on CPAP at night, esophageal cancer status post chemo and G-tube placement.  Patient currently being managed for hyperkalemia, severe dehydration and AKI and acute metabolic encephalopathy.    Assessment & Plan:   Principal Problem:   Hyperkalemia Active Problems:   Delirium   Acute kidney injury (HCC)   Malnutrition of moderate degree   Acute hyperkalemia   Diarrhea   Perinephric fluid collection   Persistent atrial fibrillation (HCC)   Typical atrial flutter (HCC)   Diarrhea of infectious origin   Atrial flutter with rapid ventricular response (HCC)   Malignant neoplasm of lower third of esophagus (HCC)   Palliative care encounter  Acute kidney injury secondary to dehydration Initially resolved now with up-trending cr in setting of ongoing diarrhea -Creatinine improved on 12/11.  No IV fluids   Delirium Intermittent.  Wife concerned that UTI may be contributing.  Mental status seems okay - haldol prn -Rocephin for UTI   End-of-life care Patient remains a full scope of treatment, full code   Bacteriuria Klebsiella UTI It is possible that Klebsiella in the urine represents chronic bacteria and colonization.  However given mentation altered from baseline it is not unreasonable to give patient a trial of intravenous antibiotics to assess for any improvement in mentation.  If there is any deterioration from a C. difficile standpoint we will need to will hold on UTI treatment. Plan: Rocephin 1 g every 24 hours per sensitivities.  5-day course.   Last dose 12/15. Consider repeat UA in 4-5 days to ensure clearance of bacteruria   Acute diarrhea secondary to C. difficile infection Continues to have profuse watery BMs multiple times a day Per GI this could be related to tube feeds which can produce 6 watery bowel movements daily Patient is status post 10 days of Dificid.  Vancomycin added by GI on 12/8 Status post flexible sigmoidoscopy on 12/11.  Normal-appearing mucosa.  Biopsies taken.  Diarrhea has been improving as of 12/12 Plan: Continue p.o. vancomycin 500 mg per tube twice daily 10 day course recommended at this time Diarrhea improving.  Remaining loose stools may be secondary to tube feeds   Possible right perinephric hematoma --Described as possible abscess by radiology but per secure chat communication w/ Dr. Apolinar Junes on 12/9, this is a chornic complex hematoma dating back over a year, nothing that has been drainable thus far, possible underlying renal mass, and in light of significant comorbidities and as he is asymptomatic inpatient evaluation has been deferred, plan is outpatient urology f/u, consider MRI then.   Severe life-threatening hyperkalemia Has required daily lokelma for the past several days - continue daily lokelma for now.  Dose decreased to 5g every day  Potassium 4.8 as of 12/14    Chronic persistent atrial flutter with rvr Cardiology now following. Remains in rvr, hemodynamically stable Plan: On apixaban On dilt and metop currently  Amio infusion discontinued per cardiology Discussed with cardiology.  No plans for cardioversion.   Patient is not far off his goal rate and is relatively asymptomatic Risk outweighs benefit for more aggressive treatment modalities   Urinary retention Resolved  Chronic diastolic congestive heart failure --Recent EF of 55% on echo obtained on November 2024   COPD --Continue as needed nebulization    Coronary artery disease,  --Continue aspirin therapy   Diabetes  mellitus type 2 with hyperglycemia -- Continue Semglee 25 units nightly (previous dose 35 units)   Hypothyroidism --on levothyroxine     Nutrition Status: Nutrition Problem: Moderate Malnutrition Etiology: chronic illness (esopahgeal mass, likely malginant) Signs/Symptoms: mild fat depletion, mild muscle depletion Interventions: Tube feeding   Peripheral vascular disease S/p bilateral BKAs --Continue aspirin  --Patient is allergic to statin therapy   OSA on CPAP at night,  --CPAP at night   Esophageal cancer status post chemo and G-tube placement. --pt gets Bolus TF --Restarted post endoscopy   DVT prophylaxis: Apixaban Code Status: Full Family Communication:Spouse [name] [phone] on 12/11, at bedside 12/12.  Via phone 12/14 Disposition Plan: Status is: Inpatient Remains inpatient appropriate because: Multiple acute issues as above   Level of care: Progressive  Consultants:  GI Oncology  Procedures:  Flexible sigmoidoscopy  Antimicrobials: Ceftriaxone   Subjective: Seen and examined.  More sleepy this morning.  No family at bedside.  Diarrhea improving.  Hemodynamically stable.  Objective: Vitals:   04/13/23 0900 04/13/23 1000 04/13/23 1100 04/13/23 1200  BP:    101/75  Pulse:    (!) 122  Resp: 20 20 18 20   Temp:    98.6 F (37 C)  TempSrc:    Oral  SpO2:    98%  Weight:      Height:       No intake or output data in the 24 hours ending 04/13/23 1300  Filed Weights   04/08/23 0500 04/10/23 0425 04/11/23 0338  Weight: 122.1 kg 123 kg 125 kg    Examination:  General exam: No acute distress.  Appears fatigued and chronically ill Respiratory system: Crackles at right base.  Normal work of breathing.  Room air Cardiovascular system: S1-S2, tachycardic, irregular rhythm, no murmurs Gastrointestinal system: Soft, nondistended, hyperactive bowel sounds, + PEG Central nervous system: Alert.  Oriented x 2.  No focal deficits Extremities: Bilateral  BKA Skin: No rashes, lesions or ulcers Psychiatry: Judgement and insight appear normal. Mood & affect appropriate.     Data Reviewed: I have personally reviewed following labs and imaging studies  CBC: Recent Labs  Lab 04/09/23 0457 04/10/23 0357 04/11/23 0435 04/12/23 0512 04/13/23 0415  WBC 16.1* 14.4* 13.3* 11.1* 11.3*  HGB 12.1* 11.6* 10.6* 11.6* 11.6*  HCT 36.6* 35.3* 32.5* 36.4* 35.6*  MCV 88.4 91.5 89.5 90.5 89.9  PLT 334 303 282 285 303   Basic Metabolic Panel: Recent Labs  Lab 04/09/23 0457 04/10/23 0357 04/11/23 0435 04/12/23 0512 04/13/23 0415  NA 132* 132* 133* 130* 129*  K 4.8 4.5 4.8 5.2* 4.8  CL 99 101 103 101 100  CO2 25 25 25 22 23   GLUCOSE 174* 135* 208* 188* 193*  BUN 41* 39* 36* 31* 32*  CREATININE 1.33* 1.25* 1.25* 1.04 0.91  CALCIUM 8.5* 8.2* 8.1* 8.4* 8.3*   GFR: Estimated Creatinine Clearance: 95.5 mL/min (by C-G formula based on SCr of 0.91 mg/dL). Liver Function Tests: No results for input(s): "AST", "ALT", "ALKPHOS", "BILITOT", "PROT", "ALBUMIN" in the last 168 hours. No results for input(s): "LIPASE", "AMYLASE" in the last 168 hours. No results for input(s): "AMMONIA" in the last 168 hours. Coagulation Profile: No results for input(s): "INR", "PROTIME" in the last 168 hours. Cardiac Enzymes: No results for input(s): "CKTOTAL", "CKMB", "CKMBINDEX", "  TROPONINI" in the last 168 hours. BNP (last 3 results) No results for input(s): "PROBNP" in the last 8760 hours. HbA1C: No results for input(s): "HGBA1C" in the last 72 hours. CBG: Recent Labs  Lab 04/12/23 1258 04/12/23 1720 04/12/23 2035 04/13/23 0802 04/13/23 1220  GLUCAP 202* 212* 222* 173* 214*   Lipid Profile: No results for input(s): "CHOL", "HDL", "LDLCALC", "TRIG", "CHOLHDL", "LDLDIRECT" in the last 72 hours. Thyroid Function Tests: No results for input(s): "TSH", "T4TOTAL", "FREET4", "T3FREE", "THYROIDAB" in the last 72 hours. Anemia Panel: No results for input(s):  "VITAMINB12", "FOLATE", "FERRITIN", "TIBC", "IRON", "RETICCTPCT" in the last 72 hours. Sepsis Labs: Recent Labs  Lab 04/10/23 0357  PROCALCITON 0.19    Recent Results (from the past 240 hours)  MRSA Next Gen by PCR, Nasal     Status: Abnormal   Collection Time: 04/05/23  7:45 PM   Specimen: Nasal Mucosa; Nasal Swab  Result Value Ref Range Status   MRSA by PCR Next Gen DETECTED (A) NOT DETECTED Final    Comment: CRITICAL RESULT CALLED TO, READ BACK BY AND VERIFIED WITH: Celene Kras RN @2154  04/05/23 LFD (NOTE) The GeneXpert MRSA Assay (FDA approved for NASAL specimens only), is one component of a comprehensive MRSA colonization surveillance program. It is not intended to diagnose MRSA infection nor to guide or monitor treatment for MRSA infections. Test performance is not FDA approved in patients less than 5 years old. Performed at [facility], [address] [place], [place] [address]   Urine Culture     Status: Abnormal   Collection Time: 04/06/23  4:20 PM   Specimen: Urine, Catheterized  Result Value Ref Range Status   Specimen Description   Final    URINE, CATHETERIZED Performed at [facility] [address] [place], [place] [address]    Special Requests   Final    NONE Performed at [facility], [address] [place], [place] [address]    Culture >=100,000 COLONIES/mL KLEBSIELLA PNEUMONIAE (A)  Final   Report Status 04/09/2023 FINAL  Final   Organism ID, Bacteria KLEBSIELLA PNEUMONIAE (A)  Final      Susceptibility   Klebsiella pneumoniae - MIC*    AMPICILLIN >=32 RESISTANT Resistant     CEFAZOLIN <=4 SENSITIVE Sensitive     CEFEPIME <=0.12 SENSITIVE Sensitive     CEFTRIAXONE <=0.25 SENSITIVE Sensitive     CIPROFLOXACIN <=0.25 SENSITIVE Sensitive     GENTAMICIN <=1 SENSITIVE Sensitive     IMIPENEM <=0.25 SENSITIVE Sensitive     NITROFURANTOIN 64 INTERMEDIATE Intermediate     TRIMETH/SULFA <=20 SENSITIVE Sensitive      AMPICILLIN/SULBACTAM 4 SENSITIVE Sensitive     PIP/TAZO <=4 SENSITIVE Sensitive ug/mL    * >=100,000 COLONIES/mL KLEBSIELLA PNEUMONIAE         Radiology Studies: No results found.      Scheduled Meds:  apixaban  5 mg Per Tube BID   ascorbic acid  500 mg Per Tube Daily   aspirin  81 mg Per Tube Daily   Chlorhexidine Gluconate Cloth  6 each Topical Daily   cholecalciferol  2,000 Units Per Tube Daily   clonazePAM  0.5 mg Per Tube BID   cyanocobalamin  10,000 mcg Per Tube Daily   diltiazem  60 mg Oral Q6H   ezetimibe  10 mg Per Tube Daily   feeding supplement (GLUCERNA 1.5 CAL)  356 mL Per Tube TID PC & HS   ferrous sulfate  300 mg Per Tube Daily  fiber supplement (BANATROL TF)  60 mL Per Tube BID   free water  200 mL Per Tube TID PC & HS   insulin aspart  0-5 Units Subcutaneous QHS   insulin aspart  0-9 Units Subcutaneous TID WC   insulin glargine-yfgn  25 Units Subcutaneous QHS   levothyroxine  25 mcg Per Tube Q0600   metoprolol tartrate  25 mg Per Tube Q6H   multivitamin with minerals  1 tablet Oral Daily   mouth rinse  15 mL Mouth Rinse 4 times per day   pramipexole  2 mg Per Tube QHS   primidone  250 mg Per Tube BID   sodium zirconium cyclosilicate  5 g Per Tube Daily   vancomycin  500 mg Per Tube BID   Continuous Infusions:  cefTRIAXone (ROCEPHIN)  IV 1 g (04/12/23 1905)     LOS: 16 days      Tresa Moore, MD [facility]   If 7PM-7AM, please contact night-coverage  04/13/2023, 1:00 PM

## 2023-04-14 DIAGNOSIS — R410 Disorientation, unspecified: Secondary | ICD-10-CM | POA: Diagnosis not present

## 2023-04-14 DIAGNOSIS — N179 Acute kidney failure, unspecified: Secondary | ICD-10-CM | POA: Diagnosis not present

## 2023-04-14 DIAGNOSIS — E875 Hyperkalemia: Secondary | ICD-10-CM | POA: Diagnosis not present

## 2023-04-14 LAB — BASIC METABOLIC PANEL
Anion gap: 7 (ref 5–15)
BUN: 32 mg/dL — ABNORMAL HIGH (ref 8–23)
CO2: 24 mmol/L (ref 22–32)
Calcium: 8.8 mg/dL — ABNORMAL LOW (ref 8.9–10.3)
Chloride: 104 mmol/L (ref 98–111)
Creatinine, Ser: 1.05 mg/dL (ref 0.61–1.24)
GFR, Estimated: >60 mL/min (ref >60)
Glucose, Bld: 196 mg/dL — ABNORMAL HIGH (ref 70–99)
Potassium: 5.2 mmol/L — ABNORMAL HIGH (ref 3.5–5.1)
Sodium: 135 mmol/L (ref 135–145)

## 2023-04-14 LAB — GLUCOSE, CAPILLARY
Glucose-Capillary: 187 mg/dL — ABNORMAL HIGH (ref 70–99)
Glucose-Capillary: 204 mg/dL — ABNORMAL HIGH (ref 70–99)
Glucose-Capillary: 188 mg/dL — ABNORMAL HIGH (ref 70–99)
Glucose-Capillary: 226 mg/dL — ABNORMAL HIGH (ref 70–99)
Glucose-Capillary: 203 mg/dL — ABNORMAL HIGH (ref 70–99)
Glucose-Capillary: 180 mg/dL — ABNORMAL HIGH (ref 70–99)
Glucose-Capillary: 190 mg/dL — ABNORMAL HIGH (ref 70–99)

## 2023-04-14 LAB — PHOSPHORUS: Phosphorus: 4.4 mg/dL (ref 2.5–4.6)

## 2023-04-14 LAB — MAGNESIUM: Magnesium: 2.3 mg/dL (ref 1.7–2.4)

## 2023-04-14 MED ORDER — METOPROLOL TARTRATE 25 MG PO TABS
25.0000 mg | ORAL_TABLET | Freq: Four times a day (QID) | ORAL | Status: DC
Start: 2023-04-15 — End: 2023-04-18
  Administered 2023-04-14 – 2023-04-18 (×14): 25 mg via ORAL
  Filled 2023-04-14 (×13): qty 1

## 2023-04-14 MED ORDER — PRAMIPEXOLE DIHYDROCHLORIDE 1 MG PO TABS
2.0000 mg | ORAL_TABLET | Freq: Every day | ORAL | Status: DC
  Administered 2023-04-14 – 2023-04-18 (×5): 2 mg via ORAL
  Filled 2023-04-14 (×5): qty 2

## 2023-04-14 MED ORDER — PRIMIDONE 250 MG PO TABS
250.0000 mg | ORAL_TABLET | Freq: Two times a day (BID) | ORAL | Status: DC
  Administered 2023-04-14 – 2023-04-18 (×9): 250 mg via ORAL
  Filled 2023-04-14 (×10): qty 1

## 2023-04-14 MED ORDER — EZETIMIBE 10 MG PO TABS
10.0000 mg | ORAL_TABLET | Freq: Every day | ORAL | Status: DC
  Administered 2023-04-15 – 2023-04-18 (×4): 10 mg via ORAL
  Filled 2023-04-14 (×4): qty 1

## 2023-04-14 MED ORDER — APIXABAN 5 MG PO TABS
5.0000 mg | ORAL_TABLET | Freq: Two times a day (BID) | ORAL | Status: DC
  Administered 2023-04-14 – 2023-04-18 (×9): 5 mg via ORAL
  Filled 2023-04-14 (×9): qty 1

## 2023-04-14 MED ORDER — SODIUM ZIRCONIUM CYCLOSILICATE 5 G PO PACK
5.0000 g | PACK | Freq: Every day | ORAL | Status: DC
  Administered 2023-04-15 – 2023-04-16 (×2): 5 g via ORAL
  Filled 2023-04-14 (×3): qty 1

## 2023-04-14 MED ORDER — VITAMIN D 25 MCG (1000 UNIT) PO TABS
2000.0000 [IU] | ORAL_TABLET | Freq: Every day | ORAL | Status: DC
  Administered 2023-04-15 – 2023-04-18 (×4): 2000 [IU] via ORAL
  Filled 2023-04-14 (×4): qty 2

## 2023-04-14 MED ORDER — VITAMIN C 500 MG PO TABS
500.0000 mg | ORAL_TABLET | Freq: Every day | ORAL | Status: DC
  Administered 2023-04-15 – 2023-04-18 (×4): 500 mg via ORAL
  Filled 2023-04-14 (×5): qty 1

## 2023-04-14 MED ORDER — HALOPERIDOL 5 MG PO TABS
5.0000 mg | ORAL_TABLET | Freq: Four times a day (QID) | ORAL | Status: DC | PRN
  Administered 2023-04-14 – 2023-04-16 (×2): 5 mg via ORAL
  Filled 2023-04-14 (×3): qty 1

## 2023-04-14 MED ORDER — VITAMIN B-12 1000 MCG PO TABS
10000.0000 ug | ORAL_TABLET | Freq: Every day | ORAL | Status: DC
  Administered 2023-04-15 – 2023-04-18 (×4): 10000 ug via ORAL
  Filled 2023-04-14 (×4): qty 10

## 2023-04-14 MED ORDER — CLONAZEPAM 0.5 MG PO TABS
0.5000 mg | ORAL_TABLET | Freq: Two times a day (BID) | ORAL | Status: DC
  Administered 2023-04-14 – 2023-04-16 (×4): 0.5 mg via ORAL
  Filled 2023-04-14 (×4): qty 1

## 2023-04-14 MED ORDER — DILTIAZEM HCL 60 MG PO TABS
60.0000 mg | ORAL_TABLET | Freq: Four times a day (QID) | ORAL | Status: DC
  Administered 2023-04-14 – 2023-04-18 (×16): 60 mg via ORAL
  Filled 2023-04-14 (×4): qty 2
  Filled 2023-04-14 (×5): qty 1
  Filled 2023-04-14 (×3): qty 2
  Filled 2023-04-14 (×5): qty 1
  Filled 2023-04-14: qty 2
  Filled 2023-04-14 (×2): qty 1
  Filled 2023-04-14: qty 2
  Filled 2023-04-14: qty 1
  Filled 2023-04-14: qty 2
  Filled 2023-04-14 (×5): qty 1

## 2023-04-14 MED ORDER — ASPIRIN 81 MG PO CHEW
81.0000 mg | CHEWABLE_TABLET | Freq: Every day | ORAL | Status: DC
  Administered 2023-04-15 – 2023-04-18 (×4): 81 mg via ORAL
  Filled 2023-04-14 (×4): qty 1

## 2023-04-14 MED ORDER — FERROUS SULFATE 220 (44 FE) MG/5ML PO SOLN
300.0000 mg | Freq: Every day | ORAL | Status: DC
  Administered 2023-04-15 – 2023-04-18 (×4): 300 mg via ORAL
  Filled 2023-04-14 (×5): qty 6.82

## 2023-04-14 MED ORDER — LEVOTHYROXINE SODIUM 25 MCG PO TABS
25.0000 ug | ORAL_TABLET | Freq: Every day | ORAL | Status: DC
  Administered 2023-04-15 – 2023-04-19 (×5): 25 ug via ORAL
  Filled 2023-04-14 (×5): qty 1

## 2023-04-14 NOTE — Consult Note (Signed)
PHARMACY CONSULT NOTE - ELECTROLYTES  Pharmacy Consult for Electrolyte Monitoring and Replacement   Recent Labs: Height: 5\' 5"  (165.1 cm) (Bilateral BKAs) Weight: 126.5 kg (278 lb 14.1 oz) IBW/kg (Calculated) : 61.5 Estimated Creatinine Clearance: 83.3 mL/min (by C-G formula based on SCr of 1.05 mg/dL).  Potassium (mmol/L)  Date Value  04/14/2023 5.2 (H)  07/12/2011 4.4   Magnesium (mg/dL)  Date Value  16/01/9603 2.3   Calcium (mg/dL)  Date Value  54/12/8117 8.8 (L)   Calcium, Total (mg/dL)  Date Value  14/78/2956 8.8   Albumin (g/dL)  Date Value  21/30/8657 2.8 (L)   Phosphorus (mg/dL)  Date Value  84/69/6295 4.4   Sodium (mmol/L)  Date Value  04/14/2023 135  07/12/2011 141   Assessment  Alec A Skowron Montez Hageman. is a 67 y.o. male presenting with hyperkalemia. PMH significant for atrial fibrillation, asthma, congestive heart failure, COPD, coronary artery disease, depression, diabetes mellitus, gout, hyperlipidemia, hypertension, hypogonadism in male, morbid obesity, peripheral vascular disease, OSA on CPAP at night, esophageal cancer status post chemo and G-tube placement. Pharmacy has been consulted to monitor and replace electrolytes.  Diet: Tube feeds MIVF: None  Goal of Therapy: Electrolytes WNL  Plan:  K+ 5.2; Lokelma 5 gm daily ordered by provider  Received 4 doses of Lokelma 10 gm daily from 12/10 - 12/13 No electrolyte replacement indicated at this time Check BMP, Mg, Phos with AM labs  Thank you for allowing pharmacy to be a part of this patient's care.  Littie Deeds, PharmD Pharmacy Resident  04/14/2023 8:09 AM

## 2023-04-14 NOTE — Plan of Care (Signed)
  Problem: Education: Goal: Ability to describe self-care measures that may prevent or decrease complications (Diabetes Survival Skills Education) will improve Outcome: Progressing   Problem: Coping: Goal: Ability to adjust to condition or change in health will improve Outcome: Progressing   Problem: Fluid Volume: Goal: Ability to maintain a balanced intake and output will improve Outcome: Progressing   Problem: Health Behavior/Discharge Planning: Goal: Ability to identify and utilize available resources and services will improve Outcome: Progressing   Problem: Metabolic: Goal: Ability to maintain appropriate glucose levels will improve Outcome: Progressing   Problem: Nutritional: Goal: Maintenance of adequate nutrition will improve Outcome: Progressing Goal: Progress toward achieving an optimal weight will improve Outcome: Progressing   Problem: Skin Integrity: Goal: Risk for impaired skin integrity will decrease Outcome: Progressing   Problem: Education: Goal: Knowledge of General Education information will improve Description: Including pain rating scale, medication(s)/side effects and non-pharmacologic comfort measures Outcome: Progressing   Problem: Health Behavior/Discharge Planning: Goal: Ability to manage health-related needs will improve Outcome: Progressing   Problem: Clinical Measurements: Goal: Ability to maintain clinical measurements within normal limits will improve Outcome: Progressing Goal: Will remain free from infection Outcome: Progressing Goal: Diagnostic test results will improve Outcome: Progressing Goal: Respiratory complications will improve Outcome: Progressing Goal: Cardiovascular complication will be avoided Outcome: Progressing   Problem: Activity: Goal: Risk for activity intolerance will decrease Outcome: Progressing   Problem: Nutrition: Goal: Adequate nutrition will be maintained Outcome: Progressing   Problem: Coping: Goal:  Level of anxiety will decrease Outcome: Progressing   Problem: Elimination: Goal: Will not experience complications related to bowel motility Outcome: Progressing Goal: Will not experience complications related to urinary retention Outcome: Progressing   Problem: Pain Management: Goal: General experience of comfort will improve Outcome: Progressing   Problem: Safety: Goal: Ability to remain free from injury will improve Outcome: Progressing   Problem: Skin Integrity: Goal: Risk for impaired skin integrity will decrease Outcome: Progressing

## 2023-04-14 NOTE — TOC Progression Note (Signed)
Transition of Care (TOC) - Progression Note    Patient Details  Name: [name]. MRN: [id number] Date of Birth: February 27, 1956  Transition of [facility]) CM/SW Contact  Bing Quarry, RN Phone Number: 04/14/2023, 3:33 PM  Clinical Narrative:  12/15: Should be ready early in the week per provider. RN CM spoke with spouse via phone as she was not in hospital. Will accept [facility] if they can provide transportation to cancer treatments. Radiation therapy 5x/week and Chemo 1x/week on the same day as one of the radiation days. Also needs to have a PET Scan for cancer staging that had to be reschedule from 04/01/23 due to medical issues/hospitalization. Spouse will only accept [facility] under those conditions after a long conversation. Will need to discuss with [facility] Monday. 334 pm.   Gabriel Cirri MSN RN CM  Care Management Department.  [place]  [facility] Direct Dial: [phone] Main Office Phone: [phone] Weekends Only            Expected Discharge Plan and Services                                               Social Determinants of Health (SDOH) Interventions SDOH Screenings   Food Insecurity: No Food Insecurity (03/29/2023)  Housing: Low Risk  (03/29/2023)  Transportation Needs: No Transportation Needs (03/29/2023)  Utilities: Not At Risk (03/29/2023)  Depression (PHQ2-9): Low Risk  (11/21/2021)  Financial Resource Strain: Low Risk  (09/28/2022)   [facility] from [facility], [facility]  Tobacco Use: Medium Risk (04/10/2023)    Readmission Risk Interventions    02/13/2022    4:57 PM 09/29/2021   11:12 AM  Readmission Risk Prevention Plan  Transportation Screening Complete Complete  PCP or Specialist Appt within 3-5 Days  Complete  HRI or Home Care Consult  Complete  Social Work Consult for Recovery Care Planning/Counseling  Complete  Palliative Care Screening  Not Applicable  Medication Review Oceanographer)  Complete Complete  PCP or Specialist appointment within 3-5 days of discharge Complete   HRI or Home Care Consult Complete   SW Recovery Care/Counseling Consult Complete   Palliative Care Screening Not Applicable   Skilled Nursing Facility Not Applicable

## 2023-04-14 NOTE — Progress Notes (Signed)
PROGRESS NOTE    [name].  VWU:[id number] DOB: [birth year] DOA: 03/28/2023 PCP: [name], MD    Brief Narrative:   [name]. is a 67 y.o. male with medical history significant of atrial fibrillation, asthma, congestive heart failure, COPD, coronary artery disease, depression, diabetes mellitus, gout, hyperlipidemia, hypertension, hypogonadism in male, morbid obesity, peripheral vascular disease, OSA on CPAP at night, esophageal cancer status post chemo and G-tube placement.  Patient currently being managed for hyperkalemia, severe dehydration and AKI and acute metabolic encephalopathy.   12/15: Lokelma ordered for potassium of 5.2  Assessment & Plan:   Principal Problem:   Hyperkalemia Active Problems:   Delirium   Acute kidney injury (HCC)   Malnutrition of moderate degree   Acute hyperkalemia   Diarrhea   Perinephric fluid collection   Persistent atrial fibrillation (HCC)   Typical atrial flutter (HCC)   Diarrhea of infectious origin   Atrial flutter with rapid ventricular response (HCC)   Malignant neoplasm of lower third of esophagus (HCC)   Palliative care encounter  Acute kidney injury secondary to dehydration Initially resolved now with up-trending cr in setting of ongoing diarrhea -Creatinine improved on 12/11.  No IV fluids   Delirium Intermittent.  Wife concerned that UTI may be contributing.  Mental status seems okay - haldol prn -Rocephin for UTI   End-of-life care Patient remains a full scope of treatment, full code   Bacteriuria Klebsiella UTI It is possible that Klebsiella in the urine represents chronic bacteria and colonization.  However given mentation altered from baseline it is not unreasonable to give patient a trial of intravenous antibiotics to assess for any improvement in mentation.  If there is any deterioration from a C. difficile standpoint we will need to will hold on UTI treatment. Plan: Rocephin 1 g every  24 hours per sensitivities.  5-day course.  Last dose 12/15. Consider repeat UA in 4-5 days to ensure clearance of bacteruria   Acute diarrhea secondary to C. difficile infection Continues to have profuse watery BMs multiple times a day Per GI this could be related to tube feeds which can produce 6 watery bowel movements daily Patient is status post 10 days of Dificid.  Vancomycin added by GI on 12/8 Status post flexible sigmoidoscopy on 12/11.  Normal-appearing mucosa.  Biopsies taken.  Diarrhea has been improving as of 12/12 Plan: Continue p.o. vancomycin 500 mg per tube twice daily 10 day course recommended at this time Diarrhea improving.  Remaining loose stools may be secondary to tube feeds   Possible right perinephric hematoma --Described as possible abscess by radiology but per secure chat communication w/ Dr. Apolinar Junes on 12/9, this is a chornic complex hematoma dating back over a year, nothing that has been drainable thus far, possible underlying renal mass, and in light of significant comorbidities and as he is asymptomatic inpatient evaluation has been deferred, plan is outpatient urology f/u, consider MRI then.   Severe life-threatening hyperkalemia Has required daily lokelma for the past several days - continue daily lokelma for now.  Dose decreased to 5g every day  Potassium 5.2.  So far he has received 4 doses of Lokelma 10 g daily from 12/10 - 12/13    Chronic persistent atrial flutter with rvr Cardiology now following. Remains in rvr, hemodynamically stable Plan: On apixaban On dilt and metop currently  Amio infusion discontinued per cardiology Discussed with cardiology.  No plans for cardioversion.   Patient is not far off his  goal rate and is relatively asymptomatic Risk outweighs benefit for more aggressive treatment modalities   Urinary retention Resolved   Chronic diastolic congestive heart failure --Recent EF of 55% on echo obtained on November 2024    COPD --Continue as needed nebulization    Coronary artery disease,  --Continue aspirin therapy   Diabetes mellitus type 2 with hyperglycemia -- Continue Semglee 25 units nightly (previous dose 35 units)   Hypothyroidism --on levothyroxine     Nutrition Status: Nutrition Problem: Moderate Malnutrition Etiology: chronic illness (esopahgeal mass, likely malginant) Signs/Symptoms: mild fat depletion, mild muscle depletion Interventions: Tube feeding   Peripheral vascular disease S/p bilateral BKAs --Continue aspirin  --Patient is allergic to statin therapy   OSA on CPAP at night,  --CPAP at night   Esophageal cancer status post chemo and G-tube placement. --pt gets Bolus TF --Restarted post endoscopy   DVT prophylaxis: Apixaban Code Status: Full Family Communication: None at bedside Disposition Plan: Status is: Inpatient Remains inpatient appropriate because: Multiple acute issues as above   Level of care: Progressive  Consultants:  GI Oncology  Procedures:  Flexible sigmoidoscopy  Antimicrobials: Ceftriaxone   Subjective:  Seems frustrated as feels alone.  Hoping to have some family come and visit him.  Diarrhea improving  Objective: Vitals:   04/13/23 1700 04/14/23 0400 04/14/23 0500 04/14/23 0916  BP: 110/73   109/76  Pulse: (!) 123 98    Resp: 16   16  Temp: 97.9 F (36.6 C) 97.8 F (36.6 C)  98.1 F (36.7 C)  TempSrc: Oral Oral    SpO2: 97% 98%    Weight:   126.5 kg   Height:        Intake/Output Summary (Last 24 hours) at 04/14/2023 1025 Last data filed at 04/13/2023 1700 Gross per 24 hour  Intake 300 ml  Output --  Net 300 ml    Filed Weights   04/10/23 0425 04/11/23 0338 04/14/23 0500  Weight: 123 kg 125 kg 126.5 kg    Examination:  General exam: No acute distress.  Appears fatigued and chronically ill Respiratory system: Decreased breath sounds at right base.  Normal work of breathing.  Room air Cardiovascular system:  S1-S2, tachycardic, irregular rhythm, no murmurs Gastrointestinal system: Soft, nondistended, hyperactive bowel sounds, + PEG Central nervous system: Alert.  Oriented x 2.  No focal deficits Extremities: Bilateral BKA Skin: No rashes, lesions or ulcers Psychiatry: Judgement and insight appear normal. Mood & affect appropriate.     Data Reviewed: I have personally reviewed following labs and imaging studies  CBC: Recent Labs  Lab 04/09/23 0457 04/10/23 0357 04/11/23 0435 04/12/23 0512 04/13/23 0415  WBC 16.1* 14.4* 13.3* 11.1* 11.3*  HGB 12.1* 11.6* 10.6* 11.6* 11.6*  HCT 36.6* 35.3* 32.5* 36.4* 35.6*  MCV 88.4 91.5 89.5 90.5 89.9  PLT 334 303 282 285 303   Basic Metabolic Panel: Recent Labs  Lab 04/10/23 0357 04/11/23 0435 04/12/23 0512 04/13/23 0415 04/14/23 0447  NA 132* 133* 130* 129* 135  K 4.5 4.8 5.2* 4.8 5.2*  CL 101 103 101 100 104  CO2 25 25 22 23 24   GLUCOSE 135* 208* 188* 193* 196*  BUN 39* 36* 31* 32* 32*  CREATININE 1.25* 1.25* 1.04 0.91 1.05  CALCIUM 8.2* 8.1* 8.4* 8.3* 8.8*  MG  --   --   --   --  2.3  PHOS  --   --   --   --  4.4    CBG: Recent Labs  Lab 04/13/23 2110 04/14/23 0116 04/14/23 0440 04/14/23 0846 04/14/23 0918  GLUCAP 152* 187* 204* 188* 226*    Sepsis Labs: Recent Labs  Lab 04/10/23 0357  PROCALCITON 0.19    Recent Results (from the past 240 hours)  MRSA Next Gen by PCR, Nasal     Status: Abnormal   Collection Time: 04/05/23  7:45 PM   Specimen: Nasal Mucosa; Nasal Swab  Result Value Ref Range Status   MRSA by PCR Next Gen DETECTED (A) NOT DETECTED Final    Comment: CRITICAL RESULT CALLED TO, READ BACK BY AND VERIFIED WITH: Celene Kras RN @2154  04/05/23 LFD (NOTE) The GeneXpert MRSA Assay (FDA approved for NASAL specimens only), is one component of a comprehensive MRSA colonization surveillance program. It is not intended to diagnose MRSA infection nor to guide or monitor treatment for MRSA infections. Test  performance is not FDA approved in patients less than 43 years old. Performed at [facility], [address] [place], [place] [address]   Urine Culture     Status: Abnormal   Collection Time: 04/06/23  4:20 PM   Specimen: Urine, Catheterized  Result Value Ref Range Status   Specimen Description   Final    URINE, CATHETERIZED Performed at [facility], [address] [place], [place] [address]    Special Requests   Final    NONE Performed at [facility], [address] [place], [place] [address]    Culture >=100,000 COLONIES/mL KLEBSIELLA PNEUMONIAE (A)  Final   Report Status 04/09/2023 FINAL  Final   Organism ID, Bacteria KLEBSIELLA PNEUMONIAE (A)  Final      Susceptibility   Klebsiella pneumoniae - MIC*    AMPICILLIN >=32 RESISTANT Resistant     CEFAZOLIN <=4 SENSITIVE Sensitive     CEFEPIME <=0.12 SENSITIVE Sensitive     CEFTRIAXONE <=0.25 SENSITIVE Sensitive     CIPROFLOXACIN <=0.25 SENSITIVE Sensitive     GENTAMICIN <=1 SENSITIVE Sensitive     IMIPENEM <=0.25 SENSITIVE Sensitive     NITROFURANTOIN 64 INTERMEDIATE Intermediate     TRIMETH/SULFA <=20 SENSITIVE Sensitive     AMPICILLIN/SULBACTAM 4 SENSITIVE Sensitive     PIP/TAZO <=4 SENSITIVE Sensitive ug/mL    * >=100,000 COLONIES/mL KLEBSIELLA PNEUMONIAE         Radiology Studies: No results found.      Scheduled Meds:  apixaban  5 mg Per Tube BID   ascorbic acid  500 mg Per Tube Daily   aspirin  81 mg Per Tube Daily   Chlorhexidine Gluconate Cloth  6 each Topical Daily   cholecalciferol  2,000 Units Per Tube Daily   clonazePAM  0.5 mg Per Tube BID   cyanocobalamin  10,000 mcg Per Tube Daily   diltiazem  60 mg Oral Q6H   ezetimibe  10 mg Per Tube Daily   feeding supplement (GLUCERNA 1.5 CAL)  356 mL Per Tube TID PC & HS   ferrous sulfate  300 mg Per Tube Daily   fiber supplement (BANATROL TF)  60 mL Per Tube BID   free water  200 mL Per Tube TID PC & HS   insulin  aspart  0-5 Units Subcutaneous QHS   insulin aspart  0-9 Units Subcutaneous TID WC   insulin glargine-yfgn  25 Units Subcutaneous QHS   levothyroxine  25 mcg Per Tube Q0600   metoprolol tartrate  25 mg Per Tube Q6H   multivitamin with minerals  1 tablet Oral Daily   mouth rinse  15 mL Mouth Rinse 4 times per day   pramipexole  2 mg Per Tube QHS   primidone  250 mg Per Tube BID   sodium zirconium cyclosilicate  5 g Per Tube Daily   vancomycin  500 mg Per Tube BID   Continuous Infusions:  cefTRIAXone (ROCEPHIN)  IV 200 mL/hr at 04/13/23 1700     LOS: 17 days   Time spent 35 minutes   Delfino Lovett, MD [facility]   If 7PM-7AM, please contact night-coverage  04/14/2023, 10:25 AM

## 2023-04-14 NOTE — Plan of Care (Signed)
  Problem: Education: Goal: Ability to describe self-care measures that may prevent or decrease complications (Diabetes Survival Skills Education) will improve Outcome: Progressing   Problem: Fluid Volume: Goal: Ability to maintain a balanced intake and output will improve Outcome: Progressing   Problem: Nutritional: Goal: Maintenance of adequate nutrition will improve Outcome: Progressing   Problem: Skin Integrity: Goal: Risk for impaired skin integrity will decrease Outcome: Progressing   

## 2023-04-15 ENCOUNTER — Other Ambulatory Visit: Payer: Self-pay | Admitting: Hospice and Palliative Medicine

## 2023-04-15 ENCOUNTER — Telehealth: Payer: Self-pay

## 2023-04-15 ENCOUNTER — Inpatient Hospital Stay: Payer: Medicare Other

## 2023-04-15 ENCOUNTER — Encounter: Payer: Self-pay | Admitting: Internal Medicine

## 2023-04-15 DIAGNOSIS — R410 Disorientation, unspecified: Secondary | ICD-10-CM | POA: Diagnosis not present

## 2023-04-15 DIAGNOSIS — E875 Hyperkalemia: Secondary | ICD-10-CM | POA: Diagnosis not present

## 2023-04-15 DIAGNOSIS — I4892 Unspecified atrial flutter: Secondary | ICD-10-CM | POA: Diagnosis not present

## 2023-04-15 DIAGNOSIS — C159 Malignant neoplasm of esophagus, unspecified: Secondary | ICD-10-CM

## 2023-04-15 DIAGNOSIS — Z515 Encounter for palliative care: Secondary | ICD-10-CM | POA: Diagnosis not present

## 2023-04-15 DIAGNOSIS — N179 Acute kidney failure, unspecified: Secondary | ICD-10-CM | POA: Diagnosis not present

## 2023-04-15 LAB — CBC
HCT: 36.8 % — ABNORMAL LOW (ref 39.0–52.0)
Hemoglobin: 11.7 g/dL — ABNORMAL LOW (ref 13.0–17.0)
MCH: 29.3 pg (ref 26.0–34.0)
MCHC: 31.8 g/dL (ref 30.0–36.0)
MCV: 92 fL (ref 80.0–100.0)
Platelets: 253 10*3/uL (ref 150–400)
RBC: 4 MIL/uL — ABNORMAL LOW (ref 4.22–5.81)
RDW: 14.4 % (ref 11.5–15.5)
WBC: 10.2 10*3/uL (ref 4.0–10.5)
nRBC: 0 % (ref 0.0–0.2)

## 2023-04-15 LAB — MAGNESIUM: Magnesium: 2.3 mg/dL (ref 1.7–2.4)

## 2023-04-15 LAB — BASIC METABOLIC PANEL
Anion gap: 9 (ref 5–15)
BUN: 33 mg/dL — ABNORMAL HIGH (ref 8–23)
CO2: 22 mmol/L (ref 22–32)
Calcium: 8.8 mg/dL — ABNORMAL LOW (ref 8.9–10.3)
Chloride: 104 mmol/L (ref 98–111)
Creatinine, Ser: 1 mg/dL (ref 0.61–1.24)
GFR, Estimated: >60 mL/min (ref >60)
Glucose, Bld: 181 mg/dL — ABNORMAL HIGH (ref 70–99)
Potassium: 4.8 mmol/L (ref 3.5–5.1)
Sodium: 135 mmol/L (ref 135–145)

## 2023-04-15 LAB — GLUCOSE, CAPILLARY
Glucose-Capillary: 186 mg/dL — ABNORMAL HIGH (ref 70–99)
Glucose-Capillary: 166 mg/dL — ABNORMAL HIGH (ref 70–99)
Glucose-Capillary: 199 mg/dL — ABNORMAL HIGH (ref 70–99)
Glucose-Capillary: 207 mg/dL — ABNORMAL HIGH (ref 70–99)
Glucose-Capillary: 218 mg/dL — ABNORMAL HIGH (ref 70–99)

## 2023-04-15 LAB — PHOSPHORUS: Phosphorus: 4 mg/dL (ref 2.5–4.6)

## 2023-04-15 LAB — AMMONIA: Ammonia: 17 umol/L (ref 9–35)

## 2023-04-15 MED ORDER — NITROGLYCERIN 0.4 MG SL SUBL: 0.4000 mg | SUBLINGUAL_TABLET | SUBLINGUAL | Status: DC | PRN

## 2023-04-15 MED ORDER — AMIODARONE HCL 200 MG PO TABS
200.0000 mg | ORAL_TABLET | Freq: Two times a day (BID) | ORAL | Status: DC
  Administered 2023-04-15 – 2023-04-18 (×7): 200 mg via ORAL
  Filled 2023-04-15 (×7): qty 1

## 2023-04-15 NOTE — Telephone Encounter (Signed)
Clinical Social Worker attempted to contact patient's wife, [name], to follow up regarding caregiver stress.  Left a vm with contact information and requested a call back.

## 2023-04-15 NOTE — Progress Notes (Signed)
Physical Therapy Treatment Patient Details Name: [name]. MRN: [id number] DOB: January 11, 1956 Today's Date: 04/15/2023   History of Present Illness Pt is a 67 yo male pt admitted for hyperkalemia, AKI, UTI, and perinephric abscess. PMH includes Afib, asthma, CHF, COPD, CAD, depression, PVD, and esophageal cancer. Currently with PEG tube placed. Of note B LE BKA.    PT Comments  Pt was pleasant and motivated to participate during the session and put forth good effort throughout. Of note, resting HR was in the low 120s but with rolling left/right in the bed it went up to the upper 150s.  Once back at rest HR returned to the 120s quickly, no adverse symptoms from the patient but further mobility training deferred, MD/nursing notified. Pt put forth good effort with below therex but required mod multi-modal cuing at times for correct technique.  Pt also reported seeing a man in his room and would occasionally speak to him despite no one else being there, MD notified.  Pt will benefit from continued PT services upon discharge to safely address deficits listed in patient problem list for decreased caregiver assistance and eventual return to PLOF.      If plan is discharge home, recommend the following: Two people to help with walking and/or transfers;Assistance with cooking/housework;Direct supervision/assist for medications management;Direct supervision/assist for financial management;Assist for transportation;Help with stairs or ramp for entrance;Supervision due to cognitive status;A lot of help with bathing/dressing/bathroom   Can travel by private vehicle     No  Equipment Recommendations  Hospital bed    Recommendations for Other Services       Precautions / Restrictions Precautions Precautions: Fall Precaution Comments: BLE BKA, no prosthetics in room Restrictions Weight Bearing Restrictions Per Provider Order: No RLE Weight Bearing Per Provider Order: Non weight bearing LLE  Weight Bearing Per Provider Order: Non weight bearing     Mobility  Bed Mobility Overal bed mobility: Needs Assistance Bed Mobility: Rolling Rolling: Supervision         General bed mobility comments: Pt able to roll left/right with use of bed rails and extra time and effort but no physical assist needed    Transfers                   General transfer comment: Deferred secondary to elevated HR with rolling in bed    Ambulation/Gait                   Stairs             Wheelchair Mobility     Tilt Bed    Modified Rankin (Stroke Patients Only)       Balance                                            Cognition Arousal: Alert Behavior During Therapy: WFL for tasks assessed/performed Overall Cognitive Status: No family/caregiver present to determine baseline cognitive functioning                                 General Comments: Alert and able to follow commands well but also saw someone in the room who wasn't there and would talk to the person occasionally, MD notified        Exercises Total Joint Exercises Quad Sets: AROM, Strengthening,  Both, 5 reps, 10 reps Hip ABduction/ADduction: Strengthening, Both, 5 reps, 10 reps (with manual resistance) Straight Leg Raises: AROM, Strengthening, Both, 10 reps Knee Flexion: AROM, Strengthening, Both, 5 reps, 10 reps    General Comments        Pertinent Vitals/Pain Pain Assessment Pain Assessment: No/denies pain    Home Living                          Prior Function            PT Goals (current goals can now be found in the care plan section) Progress towards PT goals: Not progressing toward goals - comment (limited by HR response to activity this session)    Frequency    Min 1X/week      PT Plan      Co-evaluation              AM-PAC PT "6 Clicks" Mobility   Outcome Measure  Help needed turning from your back to your side  while in a flat bed without using bedrails?: A Lot Help needed moving from lying on your back to sitting on the side of a flat bed without using bedrails?: A Lot Help needed moving to and from a bed to a chair (including a wheelchair)?: Total Help needed standing up from a chair using your arms (e.g., wheelchair or bedside chair)?: Total Help needed to walk in hospital room?: Total Help needed climbing 3-5 steps with a railing? : Total 6 Click Score: 8    End of Session   Activity Tolerance: Other (comment) (limited by elevated HR) Patient left: in bed;with call bell/phone within reach;with bed alarm set Nurse Communication: Mobility status;Other (comment) (MD/nursing notified of confusion and HR response to activity) PT Visit Diagnosis: Muscle weakness (generalized) (M62.81);Other abnormalities of gait and mobility (R26.89)     Time: 2725-3664 PT Time Calculation (min) (ACUTE ONLY): 23 min  Charges:    $Therapeutic Exercise: 8-22 mins $Therapeutic Activity: 8-22 mins PT General Charges $$ ACUTE PT VISIT: 1 Visit                     D. Scott Linder Prajapati PT, DPT 04/15/23, 10:46 AM

## 2023-04-15 NOTE — Progress Notes (Signed)
Palliative Medicine [facility] at [facility] [phone] [phone]   Name: [name]. Date: 04/15/2023 MRN: [id number]  DOB: 06-24-1955  Patient Care Team: [name], MD as PCP - General (Internal Medicine) [name], MD as PCP - Cardiology (Cardiology) [name], MD as Consulting Physician (Oncology)    REASON FOR CONSULTATION: [name]. is a 67 y.o. male with multiple medical problems including COPD, A-fib, history of CHF, PVD status post bilateral BKAs, and esophageal cancer status post PEG.  Patient admitted the hospital with multiple metabolic derangements and metabolic encephalopathy.  Found to have UTI and C. difficile colitis.  Palliative care consulted to address goals.    CODE STATUS: Full code  PAST MEDICAL HISTORY: Past Medical History:  Diagnosis Date   Arrhythmia    atrial fibrillation   Asthma    CHF (congestive heart failure) (HCC)    COPD (chronic obstructive pulmonary disease) (HCC)    Coronary artery disease    Depression    Diabetes mellitus without complication (HCC)    Gout    History anabolic steroid use    Hyperlipidemia    Hypertension    Hypogonadism in male    MI (myocardial infarction) (HCC)    Morbid obesity (HCC)    Myocardial infarction (HCC)    Peripheral vascular disease (HCC)    Perirectal abscess    Pleurisy    Sleep apnea    CPAP at night, no oxygen   Varicella     PAST SURGICAL HISTORY:  Past Surgical History:  Procedure Laterality Date   ABDOMINAL AORTIC ANEURYSM REPAIR     ACHILLES TENDON SURGERY Left 01/10/2021   Procedure: ACHILLES LENGTHENING/KIDNER;  Surgeon: Rosetta Posner, DPM;  Location: [facility] ORS;  Service: Podiatry;  Laterality: Left;   AMPUTATION Left 10/03/2021   Procedure: AMPUTATION BELOW KNEE;  Surgeon: Renford Dills, MD;  Location: [facility];  Service: Vascular;  Laterality: Left;   AMPUTATION Right  05/24/2022   Procedure: AMPUTATION BELOW KNEE;  Surgeon: Annice Needy, MD;  Location: [facility];  Service: General;  Laterality: Right;   AMPUTATION TOE Right 02/10/2016   Procedure: AMPUTATION TOE 3RD TOE;  Surgeon: Gwyneth Revels, DPM;  Location: [facility];  Service: Podiatry;  Laterality: Right;   AMPUTATION TOE Left 02/24/2020   Procedure: AMPUTATION TOE;  Surgeon: Rosetta Posner, DPM;  Location: [facility];  Service: Podiatry;  Laterality: Left;   APPLICATION OF WOUND VAC Left 02/29/2020   Procedure: APPLICATION OF WOUND VAC;  Surgeon: Rosetta Posner, DPM;  Location: [facility];  Service: Podiatry;  Laterality: Left;   BIOPSY  03/15/2023   Procedure: BIOPSY;  Surgeon: Wyline Mood, MD;  Location: [facility] ENDOSCOPY;  Service: Gastroenterology;;   BIOPSY  04/10/2023   Procedure: BIOPSY;  Surgeon: Norma Fredrickson, Boykin Nearing, MD;  Location: [facility] ENDOSCOPY;  Service: Gastroenterology;;   CARDIAC CATHETERIZATION     CARDIOVERSION [name] 02/13/2022   Procedure: CARDIOVERSION;  Surgeon: Lamar Blinks, MD;  Location: [facility];  Service: Cardiovascular;  Laterality: [name];   COLONOSCOPY WITH PROPOFOL [name] 11/18/2015   Procedure: COLONOSCOPY WITH PROPOFOL;  Surgeon: Scot Jun, MD;  Location: [facility] ENDOSCOPY;  Service: Endoscopy;  Laterality: [name];   CORONARY ARTERY BYPASS GRAFT     CORONARY STENT INTERVENTION [name] 02/02/2020   Procedure: CORONARY STENT INTERVENTION;  Surgeon: Marcina Millard, MD;  Location: [facility] INVASIVE CV LAB;  Service: Cardiovascular;  Laterality: [name];   ESOPHAGOGASTRODUODENOSCOPY (EGD) WITH PROPOFOL  [name] 03/15/2023   Procedure: ESOPHAGOGASTRODUODENOSCOPY (EGD) WITH PROPOFOL;  Surgeon: Wyline Mood, MD;  Location: [facility] ENDOSCOPY;  Service: Gastroenterology;  Laterality: [name];   FLEXIBLE SIGMOIDOSCOPY [name] 04/10/2023   Procedure: FLEXIBLE SIGMOIDOSCOPY;  Surgeon: Toledo, Boykin Nearing, MD;  Location: [facility] ENDOSCOPY;  Service: Gastroenterology;  Laterality: [name];   GASTROSTOMY [name] 03/18/2023   Procedure:  INSERTION OF GASTROSTOMY TUBE;  Surgeon: Carolan Shiver, MD;  Location: [facility];  Service: General;  Laterality: [name];   IMPACTION REMOVAL  03/15/2023   Procedure: IMPACTION REMOVAL;  Surgeon: Wyline Mood, MD;  Location: [facility] ENDOSCOPY;  Service: Gastroenterology;;   INCISION AND DRAINAGE Left 08/07/2021   Procedure: INCISION AND DRAINAGE-Partial Calcanectomy;  Surgeon: Rosetta Posner, DPM;  Location: [facility];  Service: Podiatry;  Laterality: Left;   IRRIGATION AND DEBRIDEMENT FOOT Left 02/29/2020   Procedure: IRRIGATION AND DEBRIDEMENT FOOT;  Surgeon: Rosetta Posner, DPM;  Location: [facility];  Service: Podiatry;  Laterality: Left;   IRRIGATION AND DEBRIDEMENT FOOT Left 02/24/2020   Procedure: IRRIGATION AND DEBRIDEMENT FOOT;  Surgeon: Rosetta Posner, DPM;  Location: [facility];  Service: Podiatry;  Laterality: Left;   KNEE ARTHROSCOPY     LEFT HEART CATH AND CORS/GRAFTS ANGIOGRAPHY [name] 02/02/2020   Procedure: LEFT HEART CATH AND CORS/GRAFTS ANGIOGRAPHY;  Surgeon: Dalia Heading, MD;  Location: [facility] CV [facility];  Service: Cardiovascular;  Laterality: [name];   LOWER EXTREMITY ANGIOGRAPHY Left 02/25/2020   Procedure: Lower Extremity Angiography;  Surgeon: Annice Needy, MD;  Location: [facility];  Service: Cardiovascular;  Laterality: Left;   LOWER EXTREMITY ANGIOGRAPHY Left 01/04/2021   Procedure: LOWER EXTREMITY ANGIOGRAPHY;  Surgeon: Annice Needy, MD;  Location: [facility];  Service: Cardiovascular;  Laterality: Left;   LOWER EXTREMITY ANGIOGRAPHY Right 05/21/2022   Procedure: Lower Extremity Angiography;  Surgeon: Annice Needy, MD;  Location: [facility];  Service: Cardiovascular;  Laterality: Right;   METATARSAL HEAD EXCISION Left 01/10/2021   Procedure: METATARSAL HEAD EXCISION - LEFT 5th;  Surgeon: Rosetta Posner, DPM;  Location: [facility];  Service: Podiatry;  Laterality: Left;   PERIPHERAL VASCULAR CATHETERIZATION Right 01/24/2016   Procedure: Lower  Extremity Angiography;  Surgeon: Renford Dills, MD;  Location: [facility];  Service: Cardiovascular;  Laterality: Right;   PERIPHERAL VASCULAR CATHETERIZATION Right 01/25/2016   Procedure: Lower Extremity Angiography;  Surgeon: Renford Dills, MD;  Location: [facility];  Service: Cardiovascular;  Laterality: Right;   PORTACATH PLACEMENT [name] 03/18/2023   Procedure: INSERTION PORT-A-CATH;  Surgeon: Carolan Shiver, MD;  Location: [facility];  Service: General;  Laterality: [name];   TOE AMPUTATION     TONSILLECTOMY      HEMATOLOGY/ONCOLOGY HISTORY:  Oncology History  Esophageal adenocarcinoma (HCC)  03/15/2023 Initial Diagnosis   Esophageal adenocarcinoma (HCC)   04/11/2023 -  Chemotherapy   Patient is on Treatment Plan : ESOPHAGUS Carboplatin + Paclitaxel Weekly X 6 Weeks with XRT       ALLERGIES:  is allergic to penicillins, statins, and metformin and related.  MEDICATIONS:  Current Facility-Administered Medications  Medication Dose Route Frequency Provider Last Rate Last Admin   acetaminophen (TYLENOL) tablet 650 mg  650 mg Oral Q6H PRN Enedina Finner, MD   650 mg at 04/04/23 0706   apixaban (ELIQUIS) tablet 5 mg  5 mg Oral BID Barrie Folk, RPH   5 mg at 04/15/23 1036   ascorbic acid (VITAMIN C) tablet 500 mg  500 mg Oral Daily Barrie Folk, [facility]  500 mg at 04/15/23 1036   aspirin chewable tablet 81 mg  81 mg Oral Daily Barrie Folk, [place]   81 mg at 04/15/23 1036   cholecalciferol (VITAMIN D3) 25 MCG (1000 UNIT) tablet 2,000 Units  2,000 Units Oral Daily Barrie Folk, RPH   2,000 Units at 04/15/23 1036   clonazePAM (KLONOPIN) tablet 0.5 mg  0.5 mg Oral BID Barrie Folk, RPH   0.5 mg at 04/15/23 1036   cyanocobalamin (VITAMIN B12) tablet 10,000 mcg  10,000 mcg Oral Daily Barrie Folk, RPH   10,000 mcg at 04/15/23 1036   diltiazem (CARDIZEM) tablet 60 mg  60 mg Oral Q6H Otelia Sergeant, RPH   60 mg at 04/15/23 1036    ezetimibe (ZETIA) tablet 10 mg  10 mg Oral Daily Barrie Folk, RPH   10 mg at 04/15/23 1036   feeding supplement (GLUCERNA 1.5 CAL) liquid 356 mL  356 mL Per Tube TID PC & HS Rosezetta Schlatter T, MD   356 mL at 04/15/23 1420   ferrous sulfate 220 (44 Fe) MG/5ML solution 300 mg  300 mg Oral Daily Barrie Folk, RPH   300 mg at 04/15/23 1034   fiber supplement (BANATROL TF) liquid 60 mL  60 mL Per Tube BID Kathrynn Running, MD   60 mL at 04/15/23 1035   free water 200 mL  200 mL Per Tube TID PC & HS Kathrynn Running, MD   200 mL at 04/15/23 1420   haloperidol (HALDOL) tablet 5 mg  5 mg Oral Q6H PRN Barrie Folk, RPH   5 mg at 04/14/23 2327   insulin aspart (novoLOG) injection 0-5 Units  0-5 Units Subcutaneous QHS Rosezetta Schlatter T, MD   2 Units at 04/12/23 2130   insulin aspart (novoLOG) injection 0-9 Units  0-9 Units Subcutaneous TID WC Loyce Dys, MD   2 Units at 04/15/23 1035   insulin glargine-yfgn (SEMGLEE) injection 25 Units  25 Units Subcutaneous QHS Kathrynn Running, MD   25 Units at 04/14/23 2200   ipratropium-albuterol (DUONEB) 0.5-2.5 (3) MG/3ML nebulizer solution 3 mL  3 mL Nebulization Q4H PRN Loyce Dys, MD       levothyroxine (SYNTHROID) tablet 25 mcg  25 mcg Oral Q0600 Barrie Folk, RPH   25 mcg at 04/15/23 0510   metoprolol tartrate (LOPRESSOR) tablet 25 mg  25 mg Oral Q6H Barrie Folk, [place]   25 mg at 04/15/23 5621   multivitamin with minerals tablet 1 tablet  1 tablet Oral Daily Enedina Finner, MD   1 tablet at 04/15/23 1036   nitroGLYCERIN (NITROSTAT) SL tablet 0.4 mg  0.4 mg Sublingual Q5 min PRN Delfino Lovett, MD       ondansetron (ZOFRAN-ODT) disintegrating tablet 4 mg  4 mg Oral Q6H PRN Kathrynn Running, MD   4 mg at 04/04/23 3086   Oral care mouth rinse  15 mL Mouth Rinse PRN Lolita Patella B, MD       pramipexole (MIRAPEX) tablet 2 mg  2 mg Oral QHS Barrie Folk, RPH   2 mg at 04/14/23 2300   primidone (MYSOLINE) tablet 250 mg   250 mg Oral BID Barrie Folk, RPH   250 mg at 04/15/23 1034   sodium zirconium cyclosilicate (LOKELMA) packet 5 g  5 g Oral Daily Barrie Folk, RPH   5 g at 04/15/23 1034   vancomycin (VANCOCIN) 50 mg/mL oral solution SOLN 500  mg  500 mg Per Tube BID Wylie Hail M, PA-C   500 mg at 04/15/23 1034    VITAL SIGNS: BP 90/68 (BP Location: Right Arm)   Pulse (!) 125   Temp 98.7 F (37.1 C) (Oral)   Resp 19   Ht 5\' 5"  (1.651 m) Comment: Bilateral BKAs  Wt 271 lb 2.7 oz (123 kg)   SpO2 97%   BMI 45.12 kg/m  Filed Weights   04/14/23 0500 04/14/23 1930 04/15/23 0500  Weight: 278 lb 14.1 oz (126.5 kg) 268 lb 15.4 oz (122 kg) 271 lb 2.7 oz (123 kg)    Estimated body mass index is 45.12 kg/m as calculated from the following:   Height as of this encounter: 5\' 5"  (1.651 m).   Weight as of this encounter: 271 lb 2.7 oz (123 kg).  LABS: CBC:    Component Value Date/Time   WBC 10.2 04/15/2023 0629   HGB 11.7 (L) 04/15/2023 0629   HGB 10.7 (L) 05/15/2022 1440   HCT 36.8 (L) 04/15/2023 0629   HCT 33.4 (L) 05/15/2022 1440   PLT 253 04/15/2023 0629   PLT 394 05/15/2022 1440   MCV 92.0 04/15/2023 0629   MCV 89 05/15/2022 1440   MCV 96 07/12/2011 0919   NEUTROABS 7.5 03/31/2023 0320   NEUTROABS 7.5 (H) 07/12/2011 0919   LYMPHSABS 0.7 03/31/2023 0320   LYMPHSABS 1.2 07/12/2011 0919   MONOABS 0.7 03/31/2023 0320   MONOABS 0.8 (H) 07/12/2011 0919   EOSABS 0.4 03/31/2023 0320   EOSABS 0.4 07/12/2011 0919   BASOSABS 0.1 03/31/2023 0320   BASOSABS 0.1 07/12/2011 0919   Comprehensive Metabolic Panel:    Component Value Date/Time   NA 135 04/15/2023 0629   NA 141 07/12/2011 0919   K 4.8 04/15/2023 0629   K 4.4 07/12/2011 0919   CL 104 04/15/2023 0629   CL 102 07/12/2011 0919   CO2 22 04/15/2023 0629   CO2 29 07/12/2011 0919   BUN 33 (H) 04/15/2023 0629   BUN 16 05/05/2013 1022   CREATININE 1.00 04/15/2023 0629   CREATININE 0.71 05/05/2013 1022   GLUCOSE 181 (H)  04/15/2023 0629   GLUCOSE 76 07/12/2011 0919   CALCIUM 8.8 (L) 04/15/2023 0629   CALCIUM 8.8 07/12/2011 0919   AST 14 (L) 03/28/2023 0011   ALT 18 03/28/2023 0011   ALKPHOS 120 03/28/2023 0011   BILITOT 0.4 03/28/2023 0011   PROT 6.5 03/28/2023 0011   ALBUMIN 2.8 (L) 03/28/2023 0011    RADIOGRAPHIC STUDIES: CT ABDOMEN PELVIS W CONTRAST Result Date: 03/28/2023 CLINICAL DATA:  67 year old male with history of diarrhea. History of G-tube placed on 03/18/2023. History of esophageal neoplasm. * Tracking Code: BO * EXAM: CT ABDOMEN AND PELVIS WITH CONTRAST TECHNIQUE: Multidetector CT imaging of the abdomen and pelvis was performed using the standard protocol following bolus administration of intravenous contrast. RADIATION DOSE REDUCTION: This exam was performed according to the departmental dose-optimization program which includes automated exposure control, adjustment of the mA and/or kV according to patient size and/or use of iterative reconstruction technique. CONTRAST:  80mL OMNIPAQUE IOHEXOL 300 MG/ML  SOLN COMPARISON:  CT of the abdomen and pelvis 03/15/2023. FINDINGS: Lower chest: Circumferential thickening of the distal esophagus again noted, with similar irregularity just above the level of the gastroesophageal junction. Atherosclerotic calcifications in the right coronary artery. Mild scarring in the lung bases bilaterally. Hepatobiliary: No suspicious cystic or solid hepatic lesions. No intra or extrahepatic biliary ductal dilatation. Gallbladder is nearly completely decompressed,  but otherwise unremarkable in appearance. Pancreas: No pancreatic mass. No pancreatic ductal dilatation. No pancreatic or peripancreatic fluid collections or inflammatory changes. Spleen: Small capsular calcification associated with the medial aspect of the spleen, likely related to remote trauma. Otherwise, unremarkable. Adrenals/Urinary Tract: Right kidney appears distorted secondary to enlarging perinephric fluid  collection (discussed below). 1.3 cm low-attenuation lesion in the lower pole of the right kidney and 1.8 cm exophytic low-attenuation lesion in the posterior aspect of the interpolar region of the left kidney are compatible with simple cysts (no imaging follow-up recommended). Bilateral adrenal glands are normal in appearance. No hydroureteronephrosis. Urinary bladder is unremarkable in appearance. Stomach/Bowel: Percutaneous gastrostomy tube noted with retention tip in the distal body of the stomach. Trace amount of gas adjacent to this along the greater curvature of the stomach. Stomach is otherwise unremarkable in appearance. No pathologic dilatation of small bowel or colon. Numerous colonic diverticula are noted, without surrounding inflammatory changes to suggest an acute diverticulitis at this time. Normal appendix. Vascular/Lymphatic: Atherosclerosis in the abdominal aorta and pelvic vasculature. No lymphadenopathy noted in the abdomen or pelvis. Reproductive: Prostate gland and seminal vesicles are unremarkable in appearance. Other: Substantial enlargement of an infiltrative appearing right-sided perinephric fluid collection which extends caudally along the right side of the retroperitoneum intimately associated with the right quadratus lumborum musculature in the right psoas musculature. This is irregular in shape and therefore difficult to measure accurately, but is best visualized on axial image 42 of series 2 where this currently measures up to 11.5 x 5.9 cm. This is heterogeneous in attenuation, but demonstrates peripheral enhancement with surrounding inflammatory changes, concerning for enlarging abscess. No significant volume of ascites. Trace amount of pneumoperitoneum most evident in the upper abdomen. Musculoskeletal: There are no aggressive appearing lytic or blastic lesions noted in the visualized portions of the skeleton. IMPRESSION: 1. Percutaneous gastrostomy tube retention tip appears  appropriately located in the stomach. There is a small volume of pneumoperitoneum, which may be iatrogenic given the recent placement of the tube, although the presence of pneumoperitoneum 10 days postprocedure is unusual. No other source for pneumoperitoneum confidently identified on today's examination. 2. Most notably, however, today's study demonstrates an enlarging right-sided perinephric fluid collection extending caudally in the right-side of the retroperitoneum, highly concerning for enlarging abscess. 3. Persistent circumferential thickening and irregularity of the distal esophagus in this patient with history of esophageal neoplasm. 4. Colonic diverticulosis without evidence of acute diverticulitis at this time. 5. Aortic atherosclerosis, in addition to at least right coronary artery disease. Please note that although the presence of coronary artery calcium documents the presence of coronary artery disease, the severity of this disease and any potential stenosis cannot be assessed on this non-gated CT examination. Assessment for potential risk factor modification, dietary therapy or pharmacologic therapy may be warranted, if clinically indicated. 6. Additional incidental findings, as above. Electronically Signed   By: Trudie Reed M.D.   On: 03/28/2023 06:00   DG Chest 1 View Result Date: 03/28/2023 CLINICAL DATA:  Sepsis, profuse diarrhea.  G-tube placed 03/18/2023. EXAM: CHEST  1 VIEW COMPARISON:  Radiographs 05/18/2022 and CT chest abdomen and pelvis 03/15/2023 FINDINGS: Right chest wall Port-A-Cath tip in the mid SVC. Stable cardiomegaly. Sternotomy. Pulmonary vascular congestion. Left basilar atelectasis. No pleural effusion or pneumothorax. IMPRESSION: No acute cardiopulmonary process Electronically Signed   By: Minerva Fester M.D.   On: 03/28/2023 00:28   DG C-Arm 1-60 Min-No Report Result Date: 03/18/2023 Fluoroscopy was utilized by the requesting physician.  No radiographic  interpretation.   ECHOCARDIOGRAM COMPLETE Result Date: 03/17/2023    ECHOCARDIOGRAM REPORT   Patient Name:   Simone Burrington. Date of Exam: 03/17/2023 Medical Rec #:  [id number]             Height:       76.0 in Accession #:    [id number]            Weight:       297.6 lb Date of Birth:  [birth year]            BSA:          2.623 m Patient Age:    67 years              BP:           138/73 mmHg Patient Gender: M                     HR:           76 bpm. Exam Location:  [facility] Procedure: 2D Echo and Strain Analysis Indications:     CAD Native Vessel I25.10  History:         Patient has prior history of Echocardiogram examinations, most                  recent 10/05/2021.  Sonographer:     Overton Mam RDCS, FASE Referring Phys:  [id number] Antonieta Iba Diagnosing Phys: Julien Nordmann MD  Sonographer Comments: Global longitudinal strain was attempted. IMPRESSIONS  1. Left ventricular ejection fraction, by estimation, is 55 %. The left ventricle has normal function. The left ventricle demonstrates regional wall motion abnormalities (mild hypokinesis of the basal inferior wall). There is mild left ventricular hypertrophy. Left ventricular diastolic parameters are consistent with Grade I diastolic dysfunction (impaired relaxation). The average left ventricular global longitudinal strain is -12.0 %.  2. Right ventricular systolic function is normal. The right ventricular size is normal. Tricuspid regurgitation signal is inadequate for assessing PA pressure.  3. The mitral valve is normal in structure. No evidence of mitral valve regurgitation. No evidence of mitral stenosis.  4. The aortic valve is tricuspid. There is mild calcification of the aortic valve. Aortic valve regurgitation is not visualized. Aortic valve sclerosis/calcification is present, without any evidence of aortic stenosis.  5. The inferior vena cava is normal in size with greater than 50% respiratory variability, suggesting right atrial pressure of  3 mmHg. FINDINGS  Left Ventricle: Left ventricular ejection fraction, by estimation, is 55 %. Left ventricular ejection fraction by PLAX is 57 %. The left ventricle has normal function. The left ventricle demonstrates regional wall motion abnormalities. The average left ventricular global longitudinal strain is -12.0 %. The left ventricular internal cavity size was normal in size. There is mild left ventricular hypertrophy. Left ventricular diastolic parameters are consistent with Grade I diastolic dysfunction (impaired  relaxation). Right Ventricle: The right ventricular size is normal. No increase in right ventricular wall thickness. Right ventricular systolic function is normal. Tricuspid regurgitation signal is inadequate for assessing PA pressure. Left Atrium: Left atrial size was normal in size. Right Atrium: Right atrial size was normal in size. Pericardium: There is no evidence of pericardial effusion. Mitral Valve: The mitral valve is normal in structure. No evidence of mitral valve regurgitation. No evidence of mitral valve stenosis. Tricuspid Valve: The tricuspid valve is normal in structure. Tricuspid valve regurgitation is mild . No evidence of tricuspid stenosis. Aortic  Valve: The aortic valve is tricuspid. There is mild calcification of the aortic valve. Aortic valve regurgitation is not visualized. Aortic valve sclerosis/calcification is present, without any evidence of aortic stenosis. Aortic valve peak gradient measures 11.3 mmHg. Pulmonic Valve: The pulmonic valve was normal in structure. Pulmonic valve regurgitation is not visualized. No evidence of pulmonic stenosis. Aorta: The aortic root is normal in size and structure. Venous: The inferior vena cava is normal in size with greater than 50% respiratory variability, suggesting right atrial pressure of 3 mmHg. IAS/Shunts: No atrial level shunt detected by color flow Doppler.  LEFT VENTRICLE PLAX 2D LV EF:         Left            Diastology                 ventricular     LV e' medial:    6.64 cm/s                ejection        LV E/e' medial:  11.2                fraction by     LV e' lateral:   9.79 cm/s                PLAX is 57      LV E/e' lateral: 7.6                %. LVIDd:         5.30 cm         2D LVIDs:         3.70 cm         Longitudinal LV PW:         1.40 cm         Strain LV IVS:        1.40 cm         2D Strain GLS  -12.0 % LVOT diam:     2.35 cm         Avg: LV SV:         83 LV SV Index:   32 LVOT Area:     4.34 cm  RIGHT VENTRICLE RV Basal diam:  3.80 cm RV S prime:     9.57 cm/s TAPSE (M-mode): 1.8 cm LEFT ATRIUM             Index        RIGHT ATRIUM           Index LA diam:        3.80 cm 1.45 cm/m   RA Area:     19.30 cm LA Vol (A2C):   64.8 ml 24.71 ml/m  RA Volume:   56.30 ml  21.46 ml/m LA Vol (A4C):   46.0 ml 17.54 ml/m LA Biplane Vol: 59.6 ml 22.72 ml/m  AORTIC VALVE                 PULMONIC VALVE AV Area (Vmax): 2.27 cm     PV Vmax:        0.87 m/s AV Vmax:        168.00 cm/s  PV Peak grad:   3.0 mmHg AV Peak Grad:   11.3 mmHg    RVOT Peak grad: 4 mmHg LVOT Vmax:      87.80 cm/s LVOT Vmean:     58.600 cm/s LVOT VTI:       0.192  m  AORTA Ao Root diam: 3.70 cm Ao Asc diam:  3.10 cm MITRAL VALVE MV Area (PHT): 3.54 cm    SHUNTS MV Decel Time: 214 msec    Systemic VTI:  0.19 m MV E velocity: 74.40 cm/s  Systemic Diam: 2.35 cm MV A velocity: 97.50 cm/s MV E/A ratio:  0.76 Julien Nordmann MD Electronically signed by Julien Nordmann MD Signature Date/Time: 03/17/2023/2:49:50 PM    Final     PERFORMANCE STATUS (ECOG) : 3 - Symptomatic, >50% confined to bed  Review of Systems Unless otherwise noted, a complete review of systems is negative.  Physical Exam General: NAD Cardiovascular: Tachycardic Pulmonary: Unlabored Abdomen: soft, nontender, + bowel sounds GU: no suprapubic tenderness Extremities: B. BKAs Skin: no rashes Neurological: Weakness, continues  IMPRESSION: Follow-up visit.  Weekend notes  reviewed.  Patient confused today.  [place]  Patient remains tachycardic.  Case discussed with Dr. Donneta Romberg and Dr. Sherryll Burger.  Will obtain MRI of the brain for further evaluation although likely confusion is secondary to metabolic encephalopathy.  Had a long conversation with patient's wife on the phone.  She recognizes that patient's cancer is potentially treatable but incurable.  She stated several times that his condition was "terminal.  Her hope is that patient will improve and be able to pursue cancer treatment, although she recognizes that he would be a poor treatment candidate in his present condition.  She remains in agreement with current scope of treatment.  Discussed CODE STATUS.  She would like to discuss this in more detail with patient when his confusion resolves.  Patient had previously desired to remain a full code but that was before his cancer diagnosis.  Wife is interested in counseling support.  [facility]. Also gave her contact information for grief counseling at [facility].   PLAN: -Continue current scope of treatment -Wife plans to speak with patient regarding CODE STATUS when confusion resolves -Referral to SW, [facility], grief counseling -Will follow  Case and plan discussed with Dr. Donneta Romberg and Dr. Sherryll Burger  Time Total: 45 minutes  Visit consisted of counseling and education dealing with the complex and emotionally intense issues of symptom management and palliative care in the setting of serious and potentially life-threatening illness.Greater than 50%  of this time was spent counseling and coordinating care related to the above assessment and plan.  Signed by: Laurette Schimke, PhD, NP-C

## 2023-04-15 NOTE — TOC Progression Note (Addendum)
Transition of Care (TOC) - Progression Note    Patient Details  Name: [name]. MRN: [id number] Date of Birth: August 14, 1955  Transition of Care [facility]) CM/SW Contact  Truddie Hidden, RN Phone Number: 04/15/2023, 10:32 AM  Clinical Narrative:    Spoke with Tammy in admissions to confirm if facility will transport patient to oncology treatments or appointments. Per Tammy facility will not transport patient to his treatments.  Spoke with patient's wife, [name]. She was advised facility will not transport patient to his oncology treatments. She stated she was aware and is ok with that, however is requesting for him to be transported to his rescheduled PET scan appointment. She also inquired about other bed offers. [name] was advised facilities have discretion about transport and bed offers. She has inquired about [facility] which are pending at this time.   10:29am Attempt  to reach Tammy at [facility] regarding Sherry's request. No answer. Left a message.   10:30am Spoke with Dorathy Daft, Admissions Director at [facility]. Advised patient will need transport to oncology treatments. Dorathy Daft will have the DON reeval the referral.    10:41am Left a message for Ricky at [facility] to confirm bed offer and to inquire about transport for patient to his appointment.   11:25am Spoke with Tammy. [facility] will transport patient to his appointment. Attempt to contact his wife. No answer. Left a message requesting a call back.        Expected Discharge Plan and Services                                               Social Determinants of Health (SDOH) Interventions SDOH Screenings   Food Insecurity: No Food Insecurity (03/29/2023)  Housing: Low Risk  (03/29/2023)  Transportation Needs: No Transportation Needs (03/29/2023)  Utilities: Not At Risk (03/29/2023)  Depression (PHQ2-9): Low Risk  (11/21/2021)  Financial Resource Strain: Low Risk   (09/28/2022)   Received from [facility], [facility]  Tobacco Use: Medium Risk (04/10/2023)    Readmission Risk Interventions    02/13/2022    4:57 PM 09/29/2021   11:12 AM  Readmission Risk Prevention Plan  Transportation Screening Complete Complete  PCP or Specialist Appt within 3-5 Days  Complete  HRI or Home Care Consult  Complete  Social Work Consult for Recovery Care Planning/Counseling  Complete  Palliative Care Screening  Not Applicable  Medication Review Oceanographer) Complete Complete  PCP or Specialist appointment within 3-5 days of discharge Complete   HRI or Home Care Consult Complete   SW Recovery Care/Counseling Consult Complete   Palliative Care Screening Not Applicable   Skilled Nursing Facility Not Applicable

## 2023-04-15 NOTE — Progress Notes (Signed)
PROGRESS NOTE    [name].  ZOX:[id number] DOB: Sep 20, 1955 DOA: 03/28/2023 PCP: [name], MD    Brief Narrative:   [name]. is a 67 y.o. male with medical history significant of atrial fibrillation, asthma, congestive heart failure, COPD, coronary artery disease, depression, diabetes mellitus, gout, hyperlipidemia, hypertension, hypogonadism in male, morbid obesity, peripheral vascular disease, OSA on CPAP at night, esophageal cancer status post chemo and G-tube placement.  Patient currently being managed for hyperkalemia, severe dehydration and AKI and acute metabolic encephalopathy.   12/15: Lokelma ordered for potassium of 5.2 12/16: MRI brain neg, ammonia normal, started PO amiodaone  Assessment & Plan:   Principal Problem:   Hyperkalemia Active Problems:   Delirium   Acute kidney injury (HCC)   Malnutrition of moderate degree   Acute hyperkalemia   Diarrhea   Perinephric fluid collection   Persistent atrial fibrillation (HCC)   Typical atrial flutter (HCC)   Diarrhea of infectious origin   Atrial flutter with rapid ventricular response (HCC)   Malignant neoplasm of lower third of esophagus (HCC)   Palliative care encounter  Acute kidney injury secondary to dehydration Initially resolved now with up-trending cr in setting of ongoing diarrhea -Creatinine normalized   Delirium Intermittent.  Wife concerned that UTI may be contributing.  Mental status seems okay. He does intermittent confusion MRI brain neg for acute patho. Ammonia normal - haldol prn -completed course of Rocephin for UTI   End-of-life care Patient remains a full scope of treatment, full code   Bacteriuria Klebsiella UTI It is possible that Klebsiella in the urine represents chronic bacteria and colonization.  However given mentation altered from baseline it is not unreasonable to give patient a trial of intravenous antibiotics to assess for any improvement in  mentation.  If there is any deterioration from a C. difficile standpoint we will need to will hold on UTI treatment. Plan: Rocephin 1 g every 24 hours per sensitivities.  5-day course.  Last dose 12/15.  Acute diarrhea secondary to C. difficile infection Continues to have profuse watery BMs multiple times a day Per GI this could be related to tube feeds which can produce 6 watery bowel movements daily Patient is status post 10 days of Dificid.  Vancomycin added by GI on 12/8 Status post flexible sigmoidoscopy on 12/11.  Normal-appearing mucosa.  Biopsies taken.  Diarrhea has been improving as of 12/12 Plan: Continue p.o. vancomycin 500 mg per tube twice daily, 10 day course recommended at this time Diarrhea improving.  Remaining loose stools may be secondary to tube feeds  Possible right perinephric hematoma --Described as possible abscess by radiology but per secure chat communication w/ Dr. Apolinar Junes on 12/9, this is a chornic complex hematoma dating back over a year, nothing that has been drainable thus far, possible underlying renal mass, and in light of significant comorbidities and as he is asymptomatic inpatient evaluation has been deferred, plan is outpatient urology f/u, consider MRI then.   Severe life-threatening hyperkalemia Has required daily lokelma for the past several days - continue daily lokelma for now.  Dose decreased to 5g every day  Potassium normalized now.  So far he has received 4 doses of Lokelma 10 g daily from 12/10 - 12/13    Chronic persistent atrial flutter with rvr Cardiology now following. Remains in rvr, hemodynamically stable Plan: - HR remains in 120s at rest and with minimal activity in 140-150s -Cardioversion without TEE can be performed after December 23 per  cardio - continue apixaban On dilt and metop currently  Amio infusion discontinued per cardiology in the past as ineffective amiodarone 200 mg by mouth twice daily restarted in preparation for  possible cardioversion in the near future   Urinary retention Resolved   Chronic diastolic congestive heart failure --Recent EF of 55% on echo obtained on November 2024   COPD --Continue as needed nebulization    Coronary artery disease,  --Continue aspirin therapy   Diabetes mellitus type 2 with hyperglycemia -- Continue Semglee 25 units nightly (previous dose 35 units)   Hypothyroidism --on levothyroxine     Nutrition Status: Nutrition Problem: Moderate Malnutrition Etiology: chronic illness (esopahgeal mass, likely malginant) Signs/Symptoms: mild fat depletion, mild muscle depletion Interventions: Tube feeding   Peripheral vascular disease S/p bilateral BKAs --Continue aspirin  --Patient is allergic to statin therapy   OSA on CPAP at night,  --CPAP at night   Esophageal cancer status post chemo and G-tube placement. --pt gets Bolus TF --Restarted post endoscopy   DVT prophylaxis: Apixaban Code Status: Full Family Communication: None at bedside Disposition Plan: Status is: Inpatient Remains inpatient appropriate because: Multiple acute issues as above   Level of care: Progressive  Consultants:  GI Oncology  Procedures:  Flexible sigmoidoscopy  Antimicrobials: Vanco   Subjective:  Some intermittent confusion at times, HR remains in 120's - asymptomatic.  Objective: Vitals:   04/15/23 0511 04/15/23 0600 04/15/23 0745 04/15/23 1949  BP: 107/78  90/68 97/84  Pulse: (!) 125   (!) 126  Resp: (!) 23 (!) 23 19   Temp:   98.7 F (37.1 C) 98.4 F (36.9 C)  TempSrc:   Oral Oral  SpO2:   97% 95%  Weight:      Height:        Intake/Output Summary (Last 24 hours) at 04/15/2023 2142 Last data filed at 04/15/2023 1900 Gross per 24 hour  Intake 756 ml  Output 1100 ml  Net -344 ml    Filed Weights   04/14/23 0500 04/14/23 1930 04/15/23 0500  Weight: 126.5 kg 122 kg 123 kg    Examination:  General exam: No acute distress.  Appears fatigued  and chronically ill Respiratory system: Decreased breath sounds at right base.  Normal work of breathing.  Room air Cardiovascular system: S1-S2, tachycardic, irregular rhythm, no murmurs Gastrointestinal system: Soft, nondistended, hyperactive bowel sounds, + PEG Central nervous system: Alert.  Oriented x 2.  No focal deficits Extremities: Bilateral BKA Skin: No rashes, lesions or ulcers Psychiatry: Judgement and insight appear normal. Mood & affect appropriate.     Data Reviewed: I have personally reviewed following labs and imaging studies  CBC: Recent Labs  Lab 04/10/23 0357 04/11/23 0435 04/12/23 0512 04/13/23 0415 04/15/23 0629  WBC 14.4* 13.3* 11.1* 11.3* 10.2  HGB 11.6* 10.6* 11.6* 11.6* 11.7*  HCT 35.3* 32.5* 36.4* 35.6* 36.8*  MCV 91.5 89.5 90.5 89.9 92.0  PLT 303 282 285 303 253   Basic Metabolic Panel: Recent Labs  Lab 04/11/23 0435 04/12/23 0512 04/13/23 0415 04/14/23 0447 04/15/23 0629  NA 133* 130* 129* 135 135  K 4.8 5.2* 4.8 5.2* 4.8  CL 103 101 100 104 104  CO2 25 22 23 24 22   GLUCOSE 208* 188* 193* 196* 181*  BUN 36* 31* 32* 32* 33*  CREATININE 1.25* 1.04 0.91 1.05 1.00  CALCIUM 8.1* 8.4* 8.3* 8.8* 8.8*  MG  --   --   --  2.3 2.3  PHOS  --   --   --  4.4 4.0    CBG: Recent Labs  Lab 04/15/23 0423 04/15/23 0752 04/15/23 1140 04/15/23 1650 04/15/23 2122  GLUCAP 186* 166* 199* 207* 218*    Sepsis Labs: Recent Labs  Lab 04/10/23 0357  PROCALCITON 0.19    Recent Results (from the past 240 hours)  Urine Culture     Status: Abnormal   Collection Time: 04/06/23  4:20 PM   Specimen: Urine, Catheterized  Result Value Ref Range Status   Specimen Description   Final    URINE, CATHETERIZED Performed at [facility], [address], [place], [place] [address]    Special Requests   Final    NONE Performed at [facility], [address] [place], [place] [address]    Culture >=100,000 COLONIES/mL KLEBSIELLA  PNEUMONIAE (A)  Final   Report Status 04/09/2023 FINAL  Final   Organism ID, Bacteria KLEBSIELLA PNEUMONIAE (A)  Final      Susceptibility   Klebsiella pneumoniae - MIC*    AMPICILLIN >=32 RESISTANT Resistant     CEFAZOLIN <=4 SENSITIVE Sensitive     CEFEPIME <=0.12 SENSITIVE Sensitive     CEFTRIAXONE <=0.25 SENSITIVE Sensitive     CIPROFLOXACIN <=0.25 SENSITIVE Sensitive     GENTAMICIN <=1 SENSITIVE Sensitive     IMIPENEM <=0.25 SENSITIVE Sensitive     NITROFURANTOIN 64 INTERMEDIATE Intermediate     TRIMETH/SULFA <=20 SENSITIVE Sensitive     AMPICILLIN/SULBACTAM 4 SENSITIVE Sensitive     PIP/TAZO <=4 SENSITIVE Sensitive ug/mL    * >=100,000 COLONIES/mL KLEBSIELLA PNEUMONIAE         Radiology Studies: MR BRAIN WO CONTRAST Result Date: 04/15/2023 CLINICAL DATA:  Mental status change, unknown cause EXAM: MRI HEAD WITHOUT CONTRAST TECHNIQUE: Multiplanar, multiecho pulse sequences of the brain and surrounding structures were obtained without intravenous contrast. COMPARISON:  Brain MRI 01/18/2023 FINDINGS: Brain: Negative for an acute infarct. No hemorrhage. No hydrocephalus. No extra-axial fluid collection. No mass effect. No mass lesion. There is a background of mild chronic microvascular ischemic change. Redemonstrated are scattered microhemorrhages in the supratentorial brain, unchanged from prior exam. Vascular: Normal flow voids. Skull and upper cervical spine: Normal marrow signal. Sinuses/Orbits: No middle ear or mastoid effusion. Paranasal sinuses are clear. Orbits are unremarkable. Other: None. IMPRESSION: No acute intracranial process. Electronically Signed   By: Lorenza Cambridge M.D.   On: 04/15/2023 16:12        Scheduled Meds:  amiodarone  200 mg Oral BID   apixaban  5 mg Oral BID   ascorbic acid  500 mg Oral Daily   aspirin  81 mg Oral Daily   cholecalciferol  2,000 Units Oral Daily   clonazePAM  0.5 mg Oral BID   cyanocobalamin  10,000 mcg Oral Daily   diltiazem  60  mg Oral Q6H   ezetimibe  10 mg Oral Daily   feeding supplement (GLUCERNA 1.5 CAL)  356 mL Per Tube TID PC & HS   ferrous sulfate  300 mg Oral Daily   fiber supplement (BANATROL TF)  60 mL Per Tube BID   free water  200 mL Per Tube TID PC & HS   insulin aspart  0-5 Units Subcutaneous QHS   insulin aspart  0-9 Units Subcutaneous TID WC   insulin glargine-yfgn  25 Units Subcutaneous QHS   levothyroxine  25 mcg Oral Q0600   metoprolol tartrate  25 mg Oral Q6H   multivitamin with minerals  1 tablet Oral Daily   pramipexole  2 mg Oral  QHS   primidone  250 mg Oral BID   sodium zirconium cyclosilicate  5 g Oral Daily   vancomycin  500 mg Per Tube BID   Continuous Infusions:     LOS: 18 days   Time spent 35 minutes   Delfino Lovett, MD [facility]   If 7PM-7AM, please contact night-coverage  04/15/2023, 9:42 PM

## 2023-04-15 NOTE — Progress Notes (Signed)
Rounding Note    Patient Name: [name]. Date of Encounter: 04/15/2023  [facility] Cardiologist: Lorine Bears, MD   Subjective   He is slightly confused but denies chest pain or shortness of breath.  He continues to be in atrial flutter with tachycardia.  We were asked to evaluate him again.  Inpatient Medications    Scheduled Meds:  amiodarone  200 mg Oral BID   apixaban  5 mg Oral BID   ascorbic acid  500 mg Oral Daily   aspirin  81 mg Oral Daily   cholecalciferol  2,000 Units Oral Daily   clonazePAM  0.5 mg Oral BID   cyanocobalamin  10,000 mcg Oral Daily   diltiazem  60 mg Oral Q6H   ezetimibe  10 mg Oral Daily   feeding supplement (GLUCERNA 1.5 CAL)  356 mL Per Tube TID PC & HS   ferrous sulfate  300 mg Oral Daily   fiber supplement (BANATROL TF)  60 mL Per Tube BID   free water  200 mL Per Tube TID PC & HS   insulin aspart  0-5 Units Subcutaneous QHS   insulin aspart  0-9 Units Subcutaneous TID WC   insulin glargine-yfgn  25 Units Subcutaneous QHS   levothyroxine  25 mcg Oral Q0600   metoprolol tartrate  25 mg Oral Q6H   multivitamin with minerals  1 tablet Oral Daily   pramipexole  2 mg Oral QHS   primidone  250 mg Oral BID   sodium zirconium cyclosilicate  5 g Oral Daily   vancomycin  500 mg Per Tube BID   Continuous Infusions:   PRN Meds: acetaminophen, haloperidol, ipratropium-albuterol, nitroGLYCERIN, ondansetron, mouth rinse   Vital Signs    Vitals:   04/15/23 0508 04/15/23 0511 04/15/23 0600 04/15/23 0745  BP:  107/78  90/68  Pulse: (!) 125 (!) 125    Resp:  (!) 23 (!) 23 19  Temp:    98.7 F (37.1 C)  TempSrc:    Oral  SpO2:    97%  Weight:      Height:        Intake/Output Summary (Last 24 hours) at 04/15/2023 1554 Last data filed at 04/15/2023 0600 Gross per 24 hour  Intake 756 ml  Output 2000 ml  Net -1244 ml      04/15/2023    5:00 AM 04/14/2023    7:30 PM 04/14/2023    5:00 AM  Last 3 Weights   Weight (lbs) 271 lb 2.7 oz 268 lb 15.4 oz 278 lb 14.1 oz  Weight (kg) 123 kg 122 kg 126.5 kg      Telemetry    Atrial flutter rate 110-120 - Personally Reviewed  Physical Exam   GEN: No acute distress.   Neck: No JVD Cardiac: Regular but tachycardic., no murmurs, rubs, or gallops.  Respiratory: Clear to auscultation bilaterally. GI: Soft, nontender, non-distended  MS: Bilateral BKA without edema  Labs    High Sensitivity Troponin:  No results for input(s): "TROPONINIHS" in the last 720 hours.   Chemistry Recent Labs  Lab 04/13/23 0415 04/14/23 0447 04/15/23 0629  NA 129* 135 135  K 4.8 5.2* 4.8  CL 100 104 104  CO2 23 24 22   GLUCOSE 193* 196* 181*  BUN 32* 32* 33*  CREATININE 0.91 1.05 1.00  CALCIUM 8.3* 8.8* 8.8*  MG  --  2.3 2.3  GFRNONAA >60 >60 >60  ANIONGAP 6 7 9     Lipids  No results for input(s): "CHOL", "TRIG", "HDL", "LABVLDL", "LDLCALC", "CHOLHDL" in the last 168 hours.  Hematology Recent Labs  Lab 04/12/23 0512 04/13/23 0415 04/15/23 0629  WBC 11.1* 11.3* 10.2  RBC 4.02* 3.96* 4.00*  HGB 11.6* 11.6* 11.7*  HCT 36.4* 35.6* 36.8*  MCV 90.5 89.9 92.0  MCH 28.9 29.3 29.3  MCHC 31.9 32.6 31.8  RDW 14.6 14.2 14.4  PLT 285 303 253   Thyroid No results for input(s): "TSH", "FREET4" in the last 168 hours.  BNPNo results for input(s): "BNP", "PROBNP" in the last 168 hours.  DDimer No results for input(s): "DDIMER" in the last 168 hours.   Radiology    No results found.  Patient Profile     67 y.o. male with a hx of frequent falls, PAD s/p b/l BKA, DM2, afib RVR with prior cardioversion, moderate LVH, CAD with CABG in 2006, HTN, esophageal cancer s/p chemo and G-tube placement, and CKD who is being seen for the further evaluation of aflutter RVR   Assessment & Plan    Atrial flutter with RVR - History of paroxysmal a fib with multiple cardioversions - CHA2DS2-VASc of at least 5  - Continue apixaban 5 mg BID - Remains in atrial flutter with  continued elevated rates 110-120 -IV digoxin has not been effective.   Continue diltiazem and metoprolol.  Not able to uptitrate due to soft blood pressure.  Received 2 doses of IV digoxin earlier in admission which failed to improved rates - Not a candidate for TEE given esophageal cancer -Cardioversion without TEE can be performed after December 23 as long as request does not get interrupted any further. -Amiodarone drip was not effective in rate control.  However, I elected to start amiodarone 200 mg by mouth twice daily in preparation for cardioversion in the near future as it will help keep him in sinus rhythm.  Acute metabolic encephalopathy - Esophageal cancer s/p G tube - C diff with profuse diarrhea - AMS with agitation, requires safety observations - Management per IM -MRI of the brain is pending.  CAD s/p CABG/PAD s/p BKA - Denies chest pain - Continue ASA.  Esophageal cancer: I discussed with Dr. B.  Overall prognosis seems to be poor.  Palliative care team is already involved.  For questions or updates, please contact [facility] Please consult [email or web address] for contact info under        Signed, Lorine Bears, MD  04/15/2023, 3:54 PM

## 2023-04-15 NOTE — Progress Notes (Signed)
Nutrition Follow-up  DOCUMENTATION CODES:   Obesity unspecified, Non-severe (moderate) malnutrition in context of chronic illness  INTERVENTION:   -TF via g-tube:    356 ml Glucerna 1.5 4 times daily   115 ml free water flush before and after each feeding administration   Tube feeding regimen provides 2136 kcal (100% of needs), 118 grams of protein, and 1080 ml of H2O.  Total free water: 2000 ml daily   -Allow ice chips for dry mouth per discussion with MD -Continue 1 packet Banatrol BID   NUTRITION DIAGNOSIS:   Moderate Malnutrition related to chronic illness (esopahgeal mass, likely malginant) as evidenced by mild fat depletion, mild muscle depletion.  Ongoing  GOAL:   Patient will meet greater than or equal to 90% of their needs  Met with TF  MONITOR:   TF tolerance  REASON FOR ASSESSMENT:   Consult Enteral/tube feeding initiation and management  ASSESSMENT:   Pt with medical history significant of atrial fibrillation, asthma, congestive heart failure, COPD, coronary artery disease, depression, diabetes mellitus, gout, hyperlipidemia, hypertension, hypogonadism in male, morbid obesity, peripheral vascular disease, OSA on CPAP at night, esophageal cancer status post chemo and G-tube placement.  11/15- s/p EGD- food removal in lower third of esophagus, malignant appearing esophageal stenosis, completely obstructing, esophageal tumor in lowe third of esophagus (biopsied)  11/18- s/p Stamm gastrostomy  11/30- rectal tube placed 12/5- banatrol added for diarrhea 12/6- rectal tube removed by pt 12/11- s/p flex sig- revealed poor prep, stool in rectum and recto-sigmoid colon, normal mucosa in sigmoid colon (biopsied) 12/13- s/p SLP- sign off, remain NPO  Reviewed I/O's: -1.2 Lx 24 hours and +4.7 L since 04/01/23  UOP: 2 L x 24 hours   Pt lying in bed at time of visit. He he had phone on his hand and still remains confused. No family present.   Pt remains NPO and  received TF via PEG for sole nutrition support. Pt continues to tolerate TF well. Diarrhea has improved.   Wt has been stable over the past week.   Per TOC notes, plan for SNF placement (possible [facility]) once medically stable.   Palliative care following for goals of care discussions; plan for full scope treatment at this time.   Medications reviewed and include vitamin C, klonopin, vitamin D3, vitamin B-12, cardizem, and lokelma.   Labs reviewed: CBGS: 166-226 (inpatient orders for glycemic control are 0-5 units insulin aspart daily at bedtime, 0-9 units insulin aspart TID with meals, and 25 units insulin glargine-yfgn daily).    Diet Order:   Diet Order     None       EDUCATION NEEDS:   Education needs have been addressed  Skin:  Skin Assessment: Reviewed RN Assessment  Last BM:  04/15/23 (type 7)  Height:   Ht Readings from Last 1 Encounters:  04/02/23 5\' 5"  (1.651 m)    Weight:   Wt Readings from Last 1 Encounters:  04/15/23 123 kg    Ideal Body Weight:  75.2 kg (adjusted for bilateral BKAs)  BMI:  Body mass index is 45.12 kg/m.  Estimated Nutritional Needs:   Kcal:  2951-8841  Protein:  115-130 grams  Fluid:  > 2 L    Levada Schilling, RD, LDN, CDCES Registered Dietitian III Certified Diabetes Care and Education Specialist If unable to reach this RD, please use "RD Inpatient" group chat on secure chat between hours of 8am-4 pm daily

## 2023-04-15 NOTE — Consult Note (Signed)
PHARMACY CONSULT NOTE - ELECTROLYTES  Pharmacy Consult for Electrolyte Monitoring and Replacement   Recent Labs: Height: 5\' 5"  (165.1 cm) (Bilateral BKAs) Weight: 123 kg (271 lb 2.7 oz) IBW/kg (Calculated) : 61.5 Estimated Creatinine Clearance: 86.1 mL/min (by C-G formula based on SCr of 1 mg/dL).  Potassium (mmol/L)  Date Value  04/15/2023 4.8  07/12/2011 4.4   Magnesium (mg/dL)  Date Value  16/01/9603 2.3   Calcium (mg/dL)  Date Value  54/12/8117 8.8 (L)   Calcium, Total (mg/dL)  Date Value  14/78/2956 8.8   Albumin (g/dL)  Date Value  21/30/8657 2.8 (L)   Phosphorus (mg/dL)  Date Value  84/69/6295 4.0   Sodium (mmol/L)  Date Value  04/15/2023 135  07/12/2011 141   Assessment  Alec A Sweeten Montez Hageman. is a 67 y.o. male presenting with hyperkalemia, severe dehydration and AKI. PMH significant for atrial fibrillation, asthma, congestive heart failure, COPD, coronary artery disease, depression, diabetes mellitus, gout, hyperlipidemia, hypertension, hypogonadism in male, morbid obesity, peripheral vascular disease, OSA on CPAP at night, esophageal cancer status post chemo and G-tube placement. Pharmacy has been consulted to monitor and replace electrolytes.  Diet: Tube feeds MIVF: None  Goal of Therapy: Electrolytes WNL K = 4.8 Mg = 2.3 Phos = 4.0  Hyperkalemia Lokelma 5g daily (start date 12/13) 12/12 12/13 12/14  12/15 12/16   4.8 5.2 4.8 5.2 4.8   LBM 12/15  Plan:  No electrolyte replacement indicated at this time Trend K to monitor duration of daily lokelma  Check BMP, Mg, Phos with AM labs  Thank you for allowing pharmacy to be a part of this patient's care.  Effie Shy, PharmD Pharmacy Resident  04/15/2023 8:08 AM

## 2023-04-16 ENCOUNTER — Ambulatory Visit: Payer: Medicare Other

## 2023-04-16 ENCOUNTER — Other Ambulatory Visit: Payer: Self-pay | Admitting: Internal Medicine

## 2023-04-16 ENCOUNTER — Other Ambulatory Visit: Payer: Medicare Other

## 2023-04-16 DIAGNOSIS — R410 Disorientation, unspecified: Secondary | ICD-10-CM | POA: Diagnosis not present

## 2023-04-16 DIAGNOSIS — E875 Hyperkalemia: Secondary | ICD-10-CM | POA: Diagnosis not present

## 2023-04-16 DIAGNOSIS — N179 Acute kidney failure, unspecified: Secondary | ICD-10-CM | POA: Diagnosis not present

## 2023-04-16 LAB — CBC
HCT: 39.5 % (ref 39.0–52.0)
Hemoglobin: 12.6 g/dL — ABNORMAL LOW (ref 13.0–17.0)
MCH: 28.9 pg (ref 26.0–34.0)
MCHC: 31.9 g/dL (ref 30.0–36.0)
MCV: 90.6 fL (ref 80.0–100.0)
Platelets: 251 10*3/uL (ref 150–400)
RBC: 4.36 MIL/uL (ref 4.22–5.81)
RDW: 14.4 % (ref 11.5–15.5)
WBC: 10.6 10*3/uL — ABNORMAL HIGH (ref 4.0–10.5)
nRBC: 0 % (ref 0.0–0.2)

## 2023-04-16 LAB — GLUCOSE, CAPILLARY
Glucose-Capillary: 166 mg/dL — ABNORMAL HIGH (ref 70–99)
Glucose-Capillary: 212 mg/dL — ABNORMAL HIGH (ref 70–99)
Glucose-Capillary: 184 mg/dL — ABNORMAL HIGH (ref 70–99)
Glucose-Capillary: 199 mg/dL — ABNORMAL HIGH (ref 70–99)

## 2023-04-16 LAB — BASIC METABOLIC PANEL
Anion gap: 9 (ref 5–15)
BUN: 31 mg/dL — ABNORMAL HIGH (ref 8–23)
CO2: 22 mmol/L (ref 22–32)
Calcium: 8.9 mg/dL (ref 8.9–10.3)
Chloride: 104 mmol/L (ref 98–111)
Creatinine, Ser: 1.05 mg/dL (ref 0.61–1.24)
GFR, Estimated: >60 mL/min (ref >60)
Glucose, Bld: 193 mg/dL — ABNORMAL HIGH (ref 70–99)
Potassium: 5.5 mmol/L — ABNORMAL HIGH (ref 3.5–5.1)
Sodium: 135 mmol/L (ref 135–145)

## 2023-04-16 LAB — PHOSPHORUS: Phosphorus: 4.3 mg/dL (ref 2.5–4.6)

## 2023-04-16 LAB — MAGNESIUM: Magnesium: 2.4 mg/dL (ref 1.7–2.4)

## 2023-04-16 MED ORDER — CLONAZEPAM 1 MG PO TABS
1.0000 mg | ORAL_TABLET | Freq: Two times a day (BID) | ORAL | Status: DC
  Administered 2023-04-16 – 2023-04-18 (×5): 1 mg via ORAL
  Filled 2023-04-16 (×5): qty 1

## 2023-04-16 MED ORDER — QUETIAPINE FUMARATE 25 MG PO TABS: 25.0000 mg | ORAL_TABLET | Freq: Two times a day (BID) | ORAL | Status: DC

## 2023-04-16 MED ORDER — LOPERAMIDE HCL 2 MG PO CAPS
2.0000 mg | ORAL_CAPSULE | Freq: Four times a day (QID) | ORAL | Status: DC | PRN
  Administered 2023-04-16 – 2023-04-19 (×7): 2 mg via ORAL
  Filled 2023-04-16 (×7): qty 1

## 2023-04-16 MED ORDER — HALOPERIDOL 5 MG PO TABS
5.0000 mg | ORAL_TABLET | ORAL | Status: DC | PRN
  Administered 2023-04-20 – 2023-04-23 (×4): 5 mg
  Filled 2023-04-16 (×6): qty 1

## 2023-04-16 MED ORDER — SODIUM ZIRCONIUM CYCLOSILICATE 5 G PO PACK
5.0000 g | PACK | Freq: Once | ORAL | Status: AC
  Administered 2023-04-16: 5 g via ORAL
  Filled 2023-04-16: qty 1

## 2023-04-16 NOTE — Progress Notes (Addendum)
PROGRESS NOTE    [name].  [name] DOB: 1955/05/01 DOA: 03/28/2023 PCP: [name], MD    Brief Narrative:   Alec Bender. is a 67 y.o. male with medical history significant of atrial fibrillation, asthma, congestive heart failure, COPD, coronary artery disease, depression, diabetes mellitus, gout, hyperlipidemia, hypertension, hypogonadism in male, morbid obesity, peripheral vascular disease, OSA on CPAP at night, esophageal cancer status post chemo and G-tube placement.  Patient currently being managed for hyperkalemia, severe dehydration and AKI and acute metabolic encephalopathy.   12/15: Lokelma ordered for potassium of 5.2 12/16: MRI brain neg, ammonia normal, started PO amiodaone 12/17: SNF search and insurance Auth started, last day for Vanco  Assessment & Plan:   Principal Problem:   Hyperkalemia Active Problems:   Delirium   Acute kidney injury (HCC)   Malnutrition of moderate degree   Acute hyperkalemia   Diarrhea   Perinephric fluid collection   Persistent atrial fibrillation (HCC)   Typical atrial flutter (HCC)   Diarrhea of infectious origin   Atrial flutter with rapid ventricular response (HCC)   Malignant neoplasm of lower third of esophagus (HCC)   Palliative care encounter  Acute kidney injury secondary to dehydration Initially resolved now with up-trending cr in setting of ongoing diarrhea -Creatinine normalized   Delirium Intermittent.  Wife concerned that UTI may be contributing.  Mental status seems okay. He does intermittent confusion MRI brain neg for acute patho. Ammonia normal - haldol prn -completed course of Rocephin for UTI.     End-of-life care Patient remains a full scope of treatment, full code   Bacteriuria Klebsiella UTI It is possible that Klebsiella in the urine represents chronic bacteria and colonization.  However given mentation altered from baseline it is not unreasonable to give patient a  trial of intravenous antibiotics to assess for any improvement in mentation.  If there is any deterioration from a C. difficile standpoint we will need to will hold on UTI treatment. Rocephin 1 g every 24 hours per sensitivities.  5-day course.  Last dose 12/15. Wife was asking if we could restart antibiotic or check UA.  I have made clear that if she wants to do UA and if UA is clear we cannot start antibiotic.  She prefers to just empirically give him short course of antibiotics at discharge.  Acute diarrhea secondary to C. difficile infection Continues to have profuse watery BMs multiple times a day Per GI this could be related to tube feeds which can produce 6 watery bowel movements daily Patient is status post 10 days of Dificid.  Vancomycin added by GI on 12/8 Status post flexible sigmoidoscopy on 12/11.  Normal-appearing mucosa.  Biopsies taken.  Diarrhea has been improving as of 12/12 Continue p.o. vancomycin 500 mg per tube twice daily, 10 day course recommended at this time.  2 more doses of vancomycin and it can be stopped after today Diarrhea improving.  Remaining loose stools may be secondary to tube feeds.  Started Imodium as needed.  As per patient it seems to work well at home  Possible right perinephric hematoma --Described as possible abscess by radiology but per secure chat communication w/ Dr. Apolinar Junes on 12/9, this is a chornic complex hematoma dating back over a year, nothing that has been drainable thus far, possible underlying renal mass, and in light of significant comorbidities and as he is asymptomatic inpatient evaluation has been deferred, plan is outpatient urology f/u, consider MRI then.   Severe  life-threatening hyperkalemia Has required daily lokelma for the past several days - continue daily lokelma for now.  Dose decreased to 5g every day  Potassium normalized now.  So far he has received 4 doses of Lokelma 10 g daily from 12/10 - 12/13    Chronic persistent  atrial flutter with rvr Cardiology now following. Remains in rvr, hemodynamically stable Plan: - HR remains in 120s at rest and with minimal activity in 140-150s -Cardioversion without TEE can be performed after December 23 per cardio.  This could be done as an outpatient if patient has SNF bed available - continue apixaban On dilt and metop currently  Amio infusion discontinued per cardiology in the past as ineffective amiodarone 200 mg by mouth twice daily restarted in preparation for possible cardioversion after December 23  Urinary retention Resolved   Chronic diastolic congestive heart failure --Recent EF of 55% on echo obtained on November 2024   COPD --Continue as needed nebulization    Coronary artery disease,  --Continue aspirin therapy   Diabetes mellitus type 2 with hyperglycemia -- Continue Semglee 25 units nightly (previous dose 35 units)   Hypothyroidism --on levothyroxine     Nutrition Status: Nutrition Problem: Moderate Malnutrition Etiology: chronic illness (esopahgeal mass, likely malginant) Signs/Symptoms: mild fat depletion, mild muscle depletion Interventions: Tube feeding   Peripheral vascular disease S/p bilateral BKAs --Continue aspirin  --Patient is allergic to statin therapy   OSA on CPAP at night,  --CPAP at night   Esophageal cancer status post chemo and G-tube placement. --pt gets Bolus TF --Restarted post endoscopy   DVT prophylaxis: Apixaban Code Status: Full Family Communication: None at bedside Disposition Plan: Status is: Inpatient Remains inpatient appropriate because: Multiple acute issues as above   Level of care: Progressive  Consultants:  GI Oncology  Procedures:  Flexible sigmoidoscopy  Antimicrobials: Vanco   Subjective:  Some intermittent confusion at times, HR remains in 120's - asymptomatic.  No new issues  Objective: Vitals:   04/15/23 2329 04/16/23 0006 04/16/23 0457 04/16/23 0828  BP: 129/82    123/77  Pulse:    (!) 124  Resp:  20  17  Temp: 97.8 F (36.6 C)  97.7 F (36.5 C) 98.2 F (36.8 C)  TempSrc: Oral  Oral   SpO2: 98%  97% 99%  Weight:      Height:        Intake/Output Summary (Last 24 hours) at 04/16/2023 1045 Last data filed at 04/16/2023 0300 Gross per 24 hour  Intake 0 ml  Output 750 ml  Net -750 ml    Filed Weights   04/14/23 0500 04/14/23 1930 04/15/23 0500  Weight: 126.5 kg 122 kg 123 kg    Examination:  General exam: No acute distress.  Appears fatigued and chronically ill Respiratory system: Decreased breath sounds at right base.  Normal work of breathing.  Room air Cardiovascular system: S1-S2, tachycardic, irregular rhythm, no murmurs Gastrointestinal system: Soft, nondistended, hyperactive bowel sounds, + PEG Central nervous system: Alert.  Oriented x 2.  No focal deficits Extremities: Bilateral BKA Skin: No rashes, lesions or ulcers Psychiatry: Judgement and insight appear normal. Mood & affect appropriate.     Data Reviewed: I have personally reviewed following labs and imaging studies  CBC: Recent Labs  Lab 04/11/23 0435 04/12/23 0512 04/13/23 0415 04/15/23 0629 04/16/23 0504  WBC 13.3* 11.1* 11.3* 10.2 10.6*  HGB 10.6* 11.6* 11.6* 11.7* 12.6*  HCT 32.5* 36.4* 35.6* 36.8* 39.5  MCV 89.5 90.5 89.9 92.0 90.6  PLT 282 285 303 253 251   Basic Metabolic Panel: Recent Labs  Lab 04/12/23 0512 04/13/23 0415 04/14/23 0447 04/15/23 0629 04/16/23 0504  NA 130* 129* 135 135 135  K 5.2* 4.8 5.2* 4.8 5.5*  CL 101 100 104 104 104  CO2 22 23 24 22 22   GLUCOSE 188* 193* 196* 181* 193*  BUN 31* 32* 32* 33* 31*  CREATININE 1.04 0.91 1.05 1.00 1.05  CALCIUM 8.4* 8.3* 8.8* 8.8* 8.9  MG  --   --  2.3 2.3 2.4  PHOS  --   --  4.4 4.0 4.3    CBG: Recent Labs  Lab 04/15/23 0752 04/15/23 1140 04/15/23 1650 04/15/23 2122 04/16/23 0830  GLUCAP 166* 199* 207* 218* 166*    Sepsis Labs: Recent Labs  Lab 04/10/23 0357   PROCALCITON 0.19    Recent Results (from the past 240 hours)  Urine Culture     Status: Abnormal   Collection Time: 04/06/23  4:20 PM   Specimen: Urine, Catheterized  Result Value Ref Range Status   Specimen Description   Final    URINE, CATHETERIZED Performed at [facility], [address] [place], [place] [address]    Special Requests   Final    NONE Performed at [facility], [address], [place], [place] [address]    Culture >=100,000 COLONIES/mL KLEBSIELLA PNEUMONIAE (A)  Final   Report Status 04/09/2023 FINAL  Final   Organism ID, Bacteria KLEBSIELLA PNEUMONIAE (A)  Final      Susceptibility   Klebsiella pneumoniae - MIC*    AMPICILLIN >=32 RESISTANT Resistant     CEFAZOLIN <=4 SENSITIVE Sensitive     CEFEPIME <=0.12 SENSITIVE Sensitive     CEFTRIAXONE <=0.25 SENSITIVE Sensitive     CIPROFLOXACIN <=0.25 SENSITIVE Sensitive     GENTAMICIN <=1 SENSITIVE Sensitive     IMIPENEM <=0.25 SENSITIVE Sensitive     NITROFURANTOIN 64 INTERMEDIATE Intermediate     TRIMETH/SULFA <=20 SENSITIVE Sensitive     AMPICILLIN/SULBACTAM 4 SENSITIVE Sensitive     PIP/TAZO <=4 SENSITIVE Sensitive ug/mL    * >=100,000 COLONIES/mL KLEBSIELLA PNEUMONIAE         Radiology Studies: MR BRAIN WO CONTRAST Result Date: 04/15/2023 CLINICAL DATA:  Mental status change, unknown cause EXAM: MRI HEAD WITHOUT CONTRAST TECHNIQUE: Multiplanar, multiecho pulse sequences of the brain and surrounding structures were obtained without intravenous contrast. COMPARISON:  Brain MRI 01/18/2023 FINDINGS: Brain: Negative for an acute infarct. No hemorrhage. No hydrocephalus. No extra-axial fluid collection. No mass effect. No mass lesion. There is a background of mild chronic microvascular ischemic change. Redemonstrated are scattered microhemorrhages in the supratentorial brain, unchanged from prior exam. Vascular: Normal flow voids. Skull and upper cervical spine: Normal marrow signal.  Sinuses/Orbits: No middle ear or mastoid effusion. Paranasal sinuses are clear. Orbits are unremarkable. Other: None. IMPRESSION: No acute intracranial process. Electronically Signed   By: Lorenza Cambridge M.D.   On: 04/15/2023 16:12        Scheduled Meds:  amiodarone  200 mg Oral BID   apixaban  5 mg Oral BID   ascorbic acid  500 mg Oral Daily   aspirin  81 mg Oral Daily   cholecalciferol  2,000 Units Oral Daily   clonazePAM  0.5 mg Oral BID   cyanocobalamin  10,000 mcg Oral Daily   diltiazem  60 mg Oral Q6H   ezetimibe  10 mg Oral Daily   feeding supplement (GLUCERNA 1.5 CAL)  356 mL Per  Tube TID PC & HS   ferrous sulfate  300 mg Oral Daily   fiber supplement (BANATROL TF)  60 mL Per Tube BID   free water  200 mL Per Tube TID PC & HS   insulin aspart  0-5 Units Subcutaneous QHS   insulin aspart  0-9 Units Subcutaneous TID WC   insulin glargine-yfgn  25 Units Subcutaneous QHS   levothyroxine  25 mcg Oral Q0600   metoprolol tartrate  25 mg Oral Q6H   multivitamin with minerals  1 tablet Oral Daily   pramipexole  2 mg Oral QHS   primidone  250 mg Oral BID   sodium zirconium cyclosilicate  5 g Oral Daily   vancomycin  500 mg Per Tube BID   Continuous Infusions:     LOS: 19 days   Time spent 35 minutes   Delfino Lovett, MD [facility]   If 7PM-7AM, please contact night-coverage  04/16/2023, 10:45 AM

## 2023-04-16 NOTE — Progress Notes (Signed)
Rounding Note    Patient Name: [name]. Date of Encounter: 04/16/2023  [facility] Cardiologist: Lorine Bears, MD   Subjective   Patient was seen on AM rounds. He is awake and alert on my exam, denies chest pain and palpitations. He remains in atrial flutter with rate 120s.   Inpatient Medications    Scheduled Meds:  amiodarone  200 mg Oral BID   apixaban  5 mg Oral BID   ascorbic acid  500 mg Oral Daily   aspirin  81 mg Oral Daily   cholecalciferol  2,000 Units Oral Daily   clonazePAM  0.5 mg Oral BID   cyanocobalamin  10,000 mcg Oral Daily   diltiazem  60 mg Oral Q6H   ezetimibe  10 mg Oral Daily   feeding supplement (GLUCERNA 1.5 CAL)  356 mL Per Tube TID PC & HS   ferrous sulfate  300 mg Oral Daily   fiber supplement (BANATROL TF)  60 mL Per Tube BID   free water  200 mL Per Tube TID PC & HS   insulin aspart  0-5 Units Subcutaneous QHS   insulin aspart  0-9 Units Subcutaneous TID WC   insulin glargine-yfgn  25 Units Subcutaneous QHS   levothyroxine  25 mcg Oral Q0600   metoprolol tartrate  25 mg Oral Q6H   multivitamin with minerals  1 tablet Oral Daily   pramipexole  2 mg Oral QHS   primidone  250 mg Oral BID   sodium zirconium cyclosilicate  5 g Oral Daily   vancomycin  500 mg Per Tube BID   Continuous Infusions:  PRN Meds: acetaminophen, haloperidol, ipratropium-albuterol, nitroGLYCERIN, ondansetron, mouth rinse   Vital Signs    Vitals:   04/15/23 2329 04/16/23 0006 04/16/23 0457 04/16/23 0828  BP: 129/82   123/77  Pulse:    (!) 124  Resp:  20  17  Temp: 97.8 F (36.6 C)  97.7 F (36.5 C) 98.2 F (36.8 C)  TempSrc: Oral  Oral   SpO2: 98%  97% 99%  Weight:      Height:        Intake/Output Summary (Last 24 hours) at 04/16/2023 0952 Last data filed at 04/16/2023 0300 Gross per 24 hour  Intake 0 ml  Output 750 ml  Net -750 ml      04/15/2023    5:00 AM 04/14/2023    7:30 PM 04/14/2023    5:00 AM  Last 3 Weights   Weight (lbs) 271 lb 2.7 oz 268 lb 15.4 oz 278 lb 14.1 oz  Weight (kg) 123 kg 122 kg 126.5 kg      Telemetry    Atrial flutter rate 120s - Personally Reviewed  Physical Exam   GEN: No acute distress.   Neck: No JVD Cardiac: Regular rhythm, tachycardia, no murmurs, rubs, or gallops.  Respiratory: Clear to auscultation bilaterally. GI: Soft, nontender, non-distended  MS: No edema; No deformity. Bilateral BKA Neuro:  Nonfocal  Psych: Normal affect   Labs    High Sensitivity Troponin:  No results for input(s): "TROPONINIHS" in the last 720 hours.   Chemistry Recent Labs  Lab 04/14/23 0447 04/15/23 0629 04/16/23 0504  NA 135 135 135  K 5.2* 4.8 5.5*  CL 104 104 104  CO2 24 22 22   GLUCOSE 196* 181* 193*  BUN 32* 33* 31*  CREATININE 1.05 1.00 1.05  CALCIUM 8.8* 8.8* 8.9  MG 2.3 2.3 2.4  GFRNONAA >60 >60 >60  ANIONGAP 7 9 9     Lipids No results for input(s): "CHOL", "TRIG", "HDL", "LABVLDL", "LDLCALC", "CHOLHDL" in the last 168 hours.  Hematology Recent Labs  Lab 04/13/23 0415 04/15/23 0629 04/16/23 0504  WBC 11.3* 10.2 10.6*  RBC 3.96* 4.00* 4.36  HGB 11.6* 11.7* 12.6*  HCT 35.6* 36.8* 39.5  MCV 89.9 92.0 90.6  MCH 29.3 29.3 28.9  MCHC 32.6 31.8 31.9  RDW 14.2 14.4 14.4  PLT 303 253 251   Thyroid No results for input(s): "TSH", "FREET4" in the last 168 hours.  BNPNo results for input(s): "BNP", "PROBNP" in the last 168 hours.  DDimer No results for input(s): "DDIMER" in the last 168 hours.   Radiology    MR BRAIN WO CONTRAST Result Date: 04/15/2023 CLINICAL DATA:  Mental status change, unknown cause EXAM: MRI HEAD WITHOUT CONTRAST TECHNIQUE: Multiplanar, multiecho pulse sequences of the brain and surrounding structures were obtained without intravenous contrast. COMPARISON:  Brain MRI 01/18/2023 FINDINGS: Brain: Negative for an acute infarct. No hemorrhage. No hydrocephalus. No extra-axial fluid collection. No mass effect. No mass lesion. There is a  background of mild chronic microvascular ischemic change. Redemonstrated are scattered microhemorrhages in the supratentorial brain, unchanged from prior exam. Vascular: Normal flow voids. Skull and upper cervical spine: Normal marrow signal. Sinuses/Orbits: No middle ear or mastoid effusion. Paranasal sinuses are clear. Orbits are unremarkable. Other: None. IMPRESSION: No acute intracranial process. Electronically Signed   By: Lorenza Cambridge M.D.   On: 04/15/2023 16:12    Patient Profile     67 y.o. male with a hx of frequent falls, PAD s/p b/l BKA, DM2, afib RVR with prior cardioversion, moderate LVH, CAD with CABG in 2006, HTN, esophageal cancer s/p chemo and G-tube placement, and CKD who is being seen for the further evaluation of aflutter RVR   Assessment & Plan    Atrial flutter with RVR - History of paroxysmal a fib with multiple cardioversions - CHA2DS2-VASc of at least 5  - Continue apixaban 5 mg BID - Remains in atrial flutter with continued elevated rates 110-120 - IV digoxin has not been effective - Received 2 doses of IV digoxin earlier in admission which failed to improved rates - Continue diltiazem and metoprolol. Not able to uptitrate due to soft blood pressure. - Not a candidate for TEE given esophageal cancer - Plan for cardioversion without TEE after 12/23 if no further interruption in anticoagulation - Amiodarone previously no effective in rate control. However, continue 200 mg PO BID in preparation for cardioversion for assistance in rhythm control  Acute metabolic encephalopathy - Esophageal cancer s/p G tube - C diff with profuse diarrhea - AMS with agitation, requires safety observations - MRI showed no acute intracranial process - Management per IM  CAD s/p CABG/PAD s/p BKA - Denies chest pain - Continue ASA  Esophageal cancer - Overall prognosis seems to be poor, palliative care is involved  For questions or updates, please contact [facility] Please consult [email or web address] for contact info under        Signed, Orion Crook, PA-C  04/16/2023, 9:52 AM

## 2023-04-16 NOTE — Consult Note (Signed)
PHARMACY CONSULT NOTE - ELECTROLYTES  Pharmacy Consult for Electrolyte Monitoring and Replacement   Recent Labs: Height: 5\' 5"  (165.1 cm) (Bilateral BKAs) Weight: 123 kg (271 lb 2.7 oz) IBW/kg (Calculated) : 61.5 Estimated Creatinine Clearance: 82 mL/min (by C-G formula based on SCr of 1.05 mg/dL).  Potassium (mmol/L)  Date Value  04/16/2023 5.5 (H)  07/12/2011 4.4   Magnesium (mg/dL)  Date Value  16/01/9603 2.4   Calcium (mg/dL)  Date Value  54/12/8117 8.9   Calcium, Total (mg/dL)  Date Value  14/78/2956 8.8   Albumin (g/dL)  Date Value  21/30/8657 2.8 (L)   Phosphorus (mg/dL)  Date Value  84/69/6295 4.3   Sodium (mmol/L)  Date Value  04/16/2023 135  07/12/2011 141   Assessment  Alec A Ring Montez Hageman. is a 67 y.o. male presenting with hyperkalemia, severe dehydration and AKI. PMH significant for atrial fibrillation, asthma, congestive heart failure, COPD, coronary artery disease, depression, diabetes mellitus, gout, hyperlipidemia, hypertension, hypogonadism in male, morbid obesity, peripheral vascular disease, OSA on CPAP at night, esophageal cancer status post chemo and G-tube placement. Pharmacy has been consulted to monitor and replace electrolytes.  Diet: Tube feeds MIVF: None  Goal of Therapy: Electrolytes WNL K = 5.5 Mg = 2.4 Phos = 4.3  Hyperkalemia Lokelma 5g daily (start date 12/13) 12/12 12/13 12/14  12/15 12/16 12/17   4.8 5.2 4.8 5.2 4.8 5.5   LBM  12/11 12/12 12/13  12/15 12/16  X X X 2X X   Plan:  No electrolyte replacement indicated at this time Trend K to monitor duration of daily lokelma - consider increasing Lokelma to 10 mg daily Unclear etiology of cycling hyperK Consistent bowels while on Lokelma Check BMP, Mg, Phos with AM labs  Thank you for allowing pharmacy to be a part of this patient's care.  Effie Shy, PharmD Pharmacy Resident  04/16/2023 8:44 AM

## 2023-04-16 NOTE — TOC Progression Note (Addendum)
Transition of Care (TOC) - Progression Note    Patient Details  Name: [name]. MRN: [id number] Date of Birth: 31-Aug-1955  Transition of Care [facility]) CM/SW Contact  Truddie Hidden, RN Phone Number: 04/16/2023, 9:50 AM  Clinical Narrative:    Spoke with patient's wife, [name], regarding bed offers. She was reluctant to chose [facility]. She initially requested a bed offer for a facility in [facility]. RNCM explained there are no other facilities in [facility] that offered the patient a bed based on needed oncology treatments. [name] stated she would like to accept the bed at [facility]. RNCM advised [facility] would be started. She yelled "Go ahead because that's you want to do anyway!" RNCM reiterated patient choice for [facility] or SNF. RNCM also advised [facility] discharge planning is proactive from time admission. She disconnected call.    4:30pm Patient not found in [facility]. Spoke with Tammy in Admissions at [facility]. Facility will start auth.       Expected Discharge Plan and Services                                               Social Determinants of Health (SDOH) Interventions SDOH Screenings   Food Insecurity: No Food Insecurity (03/29/2023)  Housing: Low Risk  (03/29/2023)  Transportation Needs: No Transportation Needs (03/29/2023)  Utilities: Not At Risk (03/29/2023)  Depression (PHQ2-9): Low Risk  (11/21/2021)  Financial Resource Strain: Low Risk  (09/28/2022)   Received from [facility], [facility]  Tobacco Use: Medium Risk (04/10/2023)    Readmission Risk Interventions    02/13/2022    4:57 PM 09/29/2021   11:12 AM  Readmission Risk Prevention Plan  Transportation Screening Complete Complete  PCP or Specialist Appt within 3-5 Days  Complete  HRI or Home Care Consult  Complete  Social Work Consult for Recovery Care Planning/Counseling  Complete  Palliative Care Screening  Not Applicable  Medication Review Furniture conservator/restorer) Complete Complete  PCP or Specialist appointment within 3-5 days of discharge Complete   HRI or Home Care Consult Complete   SW Recovery Care/Counseling Consult Complete   Palliative Care Screening Not Applicable   Skilled Nursing Facility Not Applicable

## 2023-04-17 ENCOUNTER — Ambulatory Visit: Payer: Medicare Other

## 2023-04-17 DIAGNOSIS — R410 Disorientation, unspecified: Secondary | ICD-10-CM | POA: Diagnosis not present

## 2023-04-17 DIAGNOSIS — N179 Acute kidney failure, unspecified: Secondary | ICD-10-CM | POA: Diagnosis not present

## 2023-04-17 DIAGNOSIS — E875 Hyperkalemia: Secondary | ICD-10-CM | POA: Diagnosis not present

## 2023-04-17 DIAGNOSIS — I4892 Unspecified atrial flutter: Secondary | ICD-10-CM | POA: Diagnosis not present

## 2023-04-17 LAB — COMPREHENSIVE METABOLIC PANEL
ALT: 34 U/L (ref 0–44)
AST: 17 U/L (ref 15–41)
Albumin: 2.4 g/dL — ABNORMAL LOW (ref 3.5–5.0)
Alkaline Phosphatase: 129 U/L — ABNORMAL HIGH (ref 38–126)
Anion gap: 8 (ref 5–15)
BUN: 38 mg/dL — ABNORMAL HIGH (ref 8–23)
CO2: 24 mmol/L (ref 22–32)
Calcium: 8.9 mg/dL (ref 8.9–10.3)
Chloride: 103 mmol/L (ref 98–111)
Creatinine, Ser: 1.11 mg/dL (ref 0.61–1.24)
GFR, Estimated: >60 mL/min (ref >60)
Glucose, Bld: 241 mg/dL — ABNORMAL HIGH (ref 70–99)
Potassium: 5.2 mmol/L — ABNORMAL HIGH (ref 3.5–5.1)
Sodium: 135 mmol/L (ref 135–145)
Total Bilirubin: <0.2 mg/dL (ref <1.2)
Total Protein: 6.6 g/dL (ref 6.5–8.1)

## 2023-04-17 LAB — GLUCOSE, CAPILLARY
Glucose-Capillary: 203 mg/dL — ABNORMAL HIGH (ref 70–99)
Glucose-Capillary: 237 mg/dL — ABNORMAL HIGH (ref 70–99)
Glucose-Capillary: 235 mg/dL — ABNORMAL HIGH (ref 70–99)
Glucose-Capillary: 184 mg/dL — ABNORMAL HIGH (ref 70–99)

## 2023-04-17 LAB — CBC
HCT: 39.5 % (ref 39.0–52.0)
Hemoglobin: 12.5 g/dL — ABNORMAL LOW (ref 13.0–17.0)
MCH: 29 pg (ref 26.0–34.0)
MCHC: 31.6 g/dL (ref 30.0–36.0)
MCV: 91.6 fL (ref 80.0–100.0)
Platelets: 282 10*3/uL (ref 150–400)
RBC: 4.31 MIL/uL (ref 4.22–5.81)
RDW: 14.4 % (ref 11.5–15.5)
WBC: 9.8 10*3/uL (ref 4.0–10.5)
nRBC: 0 % (ref 0.0–0.2)

## 2023-04-17 LAB — MAGNESIUM: Magnesium: 2.3 mg/dL (ref 1.7–2.4)

## 2023-04-17 LAB — PHOSPHORUS: Phosphorus: 4.4 mg/dL (ref 2.5–4.6)

## 2023-04-17 MED ORDER — FREE WATER
140.0000 mL | Freq: Every day | Status: DC
Start: 2023-04-17 — End: 2023-04-28
  Administered 2023-04-17 – 2023-04-28 (×64): 140 mL

## 2023-04-17 MED ORDER — GLUCERNA 1.5 CAL PO LIQD
237.0000 mL | Freq: Every day | ORAL | Status: DC
  Administered 2023-04-17 – 2023-05-03 (×87): 237 mL

## 2023-04-17 MED ORDER — GLUCERNA 1.5 CAL PO LIQD
237.0000 mL | Freq: Every day | ORAL | Status: DC
Start: 2023-04-17 — End: 2023-04-17

## 2023-04-17 MED ORDER — SODIUM ZIRCONIUM CYCLOSILICATE 10 G PO PACK
10.0000 g | PACK | Freq: Every day | ORAL | Status: DC
  Administered 2023-04-17 – 2023-04-18 (×2): 10 g via ORAL
  Filled 2023-04-17 (×3): qty 1

## 2023-04-17 NOTE — Progress Notes (Signed)
PROGRESS NOTE    [name].  NUU:[id number] DOB: June 29, 1955 DOA: 03/28/2023 PCP: [name], MD    Brief Narrative:   Alec Bender. is a 67 y.o. male with medical history significant of atrial fibrillation, asthma, congestive heart failure, COPD, coronary artery disease, depression, diabetes mellitus, gout, hyperlipidemia, hypertension, hypogonadism in male, morbid obesity, peripheral vascular disease, OSA on CPAP at night, esophageal cancer status post chemo and G-tube placement.  Patient currently being managed for hyperkalemia, severe dehydration and AKI and acute metabolic encephalopathy.   12/15: Lokelma ordered for potassium of 5.2 12/16: MRI brain neg, ammonia normal, started PO amiodaone 12/17: SNF search and insurance Auth started, last day for Vanco 12/18: waiting for SNF  Assessment & Plan:   Principal Problem:   Hyperkalemia Active Problems:   Delirium   Acute kidney injury (HCC)   Malnutrition of moderate degree   Acute hyperkalemia   Diarrhea   Perinephric fluid collection   Persistent atrial fibrillation (HCC)   Typical atrial flutter (HCC)   Diarrhea of infectious origin   Atrial flutter with rapid ventricular response (HCC)   Malignant neoplasm of lower third of esophagus (HCC)   Palliative care encounter  Acute kidney injury secondary to dehydration Initially resolved now with up-trending cr in setting of ongoing diarrhea -Creatinine normalized   Delirium Intermittent.  Wife concerned that UTI may be contributing.  Mental status seems okay. He does intermittent confusion MRI brain neg for acute patho. Ammonia normal - haldol prn -completed course of Rocephin for UTI.     End-of-life care Patient remains a full scope of treatment, full code   Bacteriuria Klebsiella UTI It is possible that Klebsiella in the urine represents chronic bacteria and colonization.  However given mentation altered from baseline it is not  unreasonable to give patient a trial of intravenous antibiotics to assess for any improvement in mentation.  If there is any deterioration from a C. difficile standpoint we will need to will hold on UTI treatment. Rocephin 1 g every 24 hours per sensitivities.  5-day course.  Last dose 12/15. Wife was asking if we could restart antibiotic or check UA.  I have made clear that if she wants to do UA and if UA is clear we cannot start antibiotic.  She prefers to just empirically give him short course of antibiotics at discharge.  Acute diarrhea secondary to C. difficile infection Continues to have profuse watery BMs multiple times a day Per GI this could be related to tube feeds which can produce 6 watery bowel movements daily Patient is status post 10 days of Dificid.  Vancomycin added by GI on 12/8 Status post flexible sigmoidoscopy on 12/11.  Normal-appearing mucosa.  Biopsies taken.  Diarrhea has been improving as of 12/12 Continue p.o. vancomycin 500 mg per tube twice daily, 10 day course recommended at this time.  2 more doses of vancomycin and it can be stopped after today Diarrhea improving.  Remaining loose stools may be secondary to tube feeds.  Imodium as needed.  As per patient it seems to work well at home  Possible right perinephric hematoma --Described as possible abscess by radiology but per secure chat communication w/ Dr. Apolinar Junes on 12/9, this is a chornic complex hematoma dating back over a year, nothing that has been drainable thus far, possible underlying renal mass, and in light of significant comorbidities and as he is asymptomatic inpatient evaluation has been deferred, plan is outpatient urology f/u, consider MRI then.  Severe life-threatening hyperkalemia Has required daily lokelma for the past several days - continue daily lokelma for now.  Dose decreased to 5g every day  Potassium normalized now.  So far he has received 4 doses of Lokelma 10 g daily from 12/10 - 12/13     Chronic persistent atrial flutter with rvr Cardiology now following. Remains in rvr, hemodynamically stable - HR remains in 120s at rest and with minimal activity in 140-150s -Cardioversion without TEE can be performed after December 23 per cardio.  This could be done as an outpatient if patient has SNF bed available - continue apixaban On dilt and metop currently  Amio infusion discontinued per cardiology in the past as ineffective amiodarone 200 mg by mouth twice daily restarted in preparation for possible cardioversion after December 23  Urinary retention Resolved   Chronic diastolic congestive heart failure --Recent EF of 55% on echo obtained on November 2024   COPD --Continue as needed nebulization    Coronary artery disease,  --Continue aspirin therapy   Diabetes mellitus type 2 with hyperglycemia -- Continue Semglee 25 units nightly (previous dose 35 units)   Hypothyroidism --on levothyroxine     Nutrition Status: Nutrition Problem: Moderate Malnutrition Etiology: chronic illness (esopahgeal mass, likely malginant) Signs/Symptoms: mild fat depletion, mild muscle depletion Interventions: Tube feeding   Peripheral vascular disease S/p bilateral BKAs --Continue aspirin  --Patient is allergic to statin therapy   OSA on CPAP at night,  --CPAP at night   Esophageal cancer status post chemo and G-tube placement. --pt gets Bolus TF --Restarted post endoscopy   DVT prophylaxis: Apixaban Code Status: Full Family Communication: None at bedside Disposition Plan: Status is: Inpatient Remains inpatient appropriate because: Multiple acute issues as above   Level of care: Progressive  Consultants:  GI Oncology  Procedures:  Flexible sigmoidoscopy  Antimicrobials: Vanco   Subjective:  Getting frustrated being here and hoping to get out of here soon  Objective: Vitals:   04/17/23 0906 04/17/23 1202 04/17/23 1327 04/17/23 1955  BP: 90/74 97/71 104/69  118/87  Pulse: (!) 106 (!) 120 (!) 118 (!) 115  Resp: 17 20  (!) 22  Temp: 97.7 F (36.5 C) 98.1 F (36.7 C)  98.7 F (37.1 C)  TempSrc:    Oral  SpO2:  94%  95%  Weight:      Height:        Intake/Output Summary (Last 24 hours) at 04/17/2023 2050 Last data filed at 04/17/2023 1614 Gross per 24 hour  Intake 1409 ml  Output 1450 ml  Net -41 ml    Filed Weights   04/14/23 1930 04/15/23 0500 04/17/23 0349  Weight: 122 kg 123 kg 115 kg    Examination:  General exam: No acute distress.  Appears fatigued and chronically ill Respiratory system: Decreased breath sounds at right base.  Normal work of breathing.  Room air Cardiovascular system: S1-S2, tachycardic, irregular rhythm, no murmurs Gastrointestinal system: Soft, nondistended, hyperactive bowel sounds, + PEG Central nervous system: Alert.  Oriented x 2.  No focal deficits Extremities: Bilateral BKA Skin: No rashes, lesions or ulcers Psychiatry: Judgement and insight appear normal. Mood & affect appropriate.     Data Reviewed: I have personally reviewed following labs and imaging studies  CBC: Recent Labs  Lab 04/12/23 0512 04/13/23 0415 04/15/23 0629 04/16/23 0504 04/17/23 0454  WBC 11.1* 11.3* 10.2 10.6* 9.8  HGB 11.6* 11.6* 11.7* 12.6* 12.5*  HCT 36.4* 35.6* 36.8* 39.5 39.5  MCV 90.5 89.9 92.0 90.6  91.6  PLT 285 303 253 251 282   Basic Metabolic Panel: Recent Labs  Lab 04/13/23 0415 04/14/23 0447 04/15/23 0629 04/16/23 0504 04/17/23 0712  NA 129* 135 135 135 135  K 4.8 5.2* 4.8 5.5* 5.2*  CL 100 104 104 104 103  CO2 23 24 22 22 24   GLUCOSE 193* 196* 181* 193* 241*  BUN 32* 32* 33* 31* 38*  CREATININE 0.91 1.05 1.00 1.05 1.11  CALCIUM 8.3* 8.8* 8.8* 8.9 8.9  MG  --  2.3 2.3 2.4 2.3  PHOS  --  4.4 4.0 4.3 4.4    CBG: Recent Labs  Lab 04/16/23 1606 04/16/23 2056 04/17/23 0908 04/17/23 1203 04/17/23 1629  GLUCAP 184* 199* 203* 237* 235*    Sepsis Labs: No results for input(s):  "PROCALCITON", "LATICACIDVEN" in the last 168 hours.   No results found for this or any previous visit (from the past 240 hours).        Radiology Studies: No results found.       Scheduled Meds:  amiodarone  200 mg Oral BID   apixaban  5 mg Oral BID   ascorbic acid  500 mg Oral Daily   aspirin  81 mg Oral Daily   cholecalciferol  2,000 Units Oral Daily   clonazePAM  1 mg Oral BID   cyanocobalamin  10,000 mcg Oral Daily   diltiazem  60 mg Oral Q6H   ezetimibe  10 mg Oral Daily   feeding supplement (GLUCERNA 1.5 CAL)  237 mL Per Tube 6 X Daily   ferrous sulfate  300 mg Oral Daily   free water  140 mL Per Tube 6 X Daily   insulin aspart  0-5 Units Subcutaneous QHS   insulin aspart  0-9 Units Subcutaneous TID WC   insulin glargine-yfgn  25 Units Subcutaneous QHS   levothyroxine  25 mcg Oral Q0600   metoprolol tartrate  25 mg Oral Q6H   multivitamin with minerals  1 tablet Oral Daily   pramipexole  2 mg Oral QHS   primidone  250 mg Oral BID   sodium zirconium cyclosilicate  10 g Oral Daily   Continuous Infusions:     LOS: 20 days   Time spent 35 minutes   Delfino Lovett, MD [facility]   If 7PM-7AM, please contact night-coverage  04/17/2023, 8:50 PM

## 2023-04-17 NOTE — Consult Note (Signed)
PHARMACY CONSULT NOTE - ELECTROLYTES  Pharmacy Consult for Electrolyte Monitoring and Replacement   Recent Labs: Height: 5\' 5"  (165.1 cm) (Bilateral BKAs) Weight: 115 kg (253 lb 8.5 oz) IBW/kg (Calculated) : 61.5 Estimated Creatinine Clearance: 74.7 mL/min (by C-G formula based on SCr of 1.11 mg/dL).  Potassium (mmol/L)  Date Value  04/17/2023 5.2 (H)  07/12/2011 4.4   Magnesium (mg/dL)  Date Value  81/19/1478 2.3   Calcium (mg/dL)  Date Value  29/56/2130 8.9   Calcium, Total (mg/dL)  Date Value  86/57/8469 8.8   Albumin (g/dL)  Date Value  62/95/2841 2.4 (L)   Phosphorus (mg/dL)  Date Value  32/44/0102 4.4   Sodium (mmol/L)  Date Value  04/17/2023 135  07/12/2011 141   Assessment  [name]. is a 67 y.o. male presenting with hyperkalemia, severe dehydration and AKI. PMH significant for atrial fibrillation, asthma, congestive heart failure, COPD, coronary artery disease, depression, diabetes mellitus, gout, hyperlipidemia, hypertension, hypogonadism in male, morbid obesity, peripheral vascular disease, OSA on CPAP at night, esophageal cancer status post chemo and G-tube placement. Pharmacy has been consulted to monitor and replace electrolytes.  Diet: Tube feeds MIVF: None, free water 200 ml TID  Goal of Therapy: K+ > 4 Mg > 2   Plan:  Increase lokelma from 5g to 10g daily. Potassium trending down.  F/u with AM labs.   Thank you for allowing pharmacy to be a part of this patient's care.  [name], PharmD 04/17/2023 9:17 AM

## 2023-04-17 NOTE — Progress Notes (Addendum)
Nutrition Follow-up  DOCUMENTATION CODES:   Obesity unspecified, Non-severe (moderate) malnutrition in context of chronic illness  INTERVENTION:   -TF via g-tube:    237 ml Glucerna 1.5 6 times daily  70 ml free water flush before each feeding and 70 ml free water flush after each after each feeding administration   Tube feeding regimen provides 2136 kcal (100% of needs), 118 grams of protein, and 1080 ml of H2O.  Total free water: 1920 ml daily   -Allow ice chips for dry mouth per discussion with MD -D/c 1 packet Banatrol BID   NUTRITION DIAGNOSIS:   Moderate Malnutrition related to chronic illness (esopahgeal mass, likely malginant) as evidenced by mild fat depletion, mild muscle depletion.  Ongoing  GOAL:   Patient will meet greater than or equal to 90% of their needs  Met with TF  MONITOR:   TF tolerance  REASON FOR ASSESSMENT:   Consult Enteral/tube feeding initiation and management  ASSESSMENT:   Pt with medical history significant of atrial fibrillation, asthma, congestive heart failure, COPD, coronary artery disease, depression, diabetes mellitus, gout, hyperlipidemia, hypertension, hypogonadism in male, morbid obesity, peripheral vascular disease, OSA on CPAP at night, esophageal cancer status post chemo and G-tube placement.  11/15- s/p EGD- food removal in lower third of esophagus, malignant appearing esophageal stenosis, completely obstructing, esophageal tumor in lowe third of esophagus (biopsied)  11/18- s/p Stamm gastrostomy  11/30- rectal tube placed 12/5- banatrol added for diarrhea 12/6- rectal tube removed by pt 12/11- s/p flex sig- revealed poor prep, stool in rectum and recto-sigmoid colon, normal mucosa in sigmoid colon (biopsied) 12/13- s/p SLP- sign off, remain NPO   Reviewed I/O's: -494 ml x 24 hours and +7 L since 04/03/23  UOP: 1.1 L x 24 hours  Pt lying in bed at time of visit, but arouse easily to touch. Pt reports feeling well  today, only complaint offered I dry mouth. Pt shares that staff is providing ice chips when needed.   Pt remains NPO and received TF via PEG for sole nutrition support. Pt continues to tolerate TF well. Diarrhea has improved.   Reviewed wt hx; noted 6.5% wt loss over the past week. Noted last wt (253.5#) may be an outlier given wt stability over the past 2 weeks. RD will continue to monitor wt trends. RD completed nutrition-focused physical exam at time of visit; findings unchanged since admission.   Spoke with pt wife at bedside and on telephone. Pt wife with multiple concerns and questions regarding pt's TF and nutritional plan of care. Pt wife expressed dissatisfaction with TF formula and management PTA; these concerns were also addressed in detail when pt was admitted to the hospital (see note on 03/29/23) for further details; per note, wife did not agree with RD assessment at that time. Wife again asked for an explanation as to why previous TF formula (Osmolite 1.5) was chosen. RD provided a detailed explanation of progress and updates to care from when pt was previously admitted to receive PEG tube up until current day. RD explained to wife that prior to PEG being placed, pt was taking oral intake (comprising mostly of cream soups and broth) for 2-3 weeks prior to PEG being placed and intake was not adequate during this time period. RD discussed that anytime nutritional intake is inadequate for an extended period of time, there is a concern for refeeding risk. RD explained to pt wife what refeeding syndrome is and the standard nutritional management/ practice for this (starting  feeds slowly as nutrition is re-introduced, adding thiamine and MVI, and closely monitoring and repleting electrolytes); RD discussed if that refeeding risk was not appropriately managed and that if precautions were not put into place to monitor and treat refeeding risk, that this could result in serious complications including  death. In order to best manage pt at that time, decision making was made in order to best manage refeeding risk and management of his other co-morbidities was also taken into consideration (history DM, glucose control, anticipation of need for cancer treatments, etc). RD also assured that blood glucose monitoring was done at this time and reviewed efforts made by this RD and medical team to optimize glycemic management. A low fiber formula (Osmolite 1.5) was chosen at that time to promote tolerance for reintroduction of nutrition. RD explained that given all the factors listed above, that the most conservative measures were put into place to give pt the best chance of tolerating nutrition and to minimize/ proactively treat any complications that may have arisen. RD also shared glycemic control was adequate when pt left the hospital, so change to a speciality formula (diabetes specific) was not warranted at that time. Choosing a diabetes specific (specialty) formula also requires additional documentation, including documented evidence that a standard formula (such as Osmolite) was trialed and blood sugars were not adequately controlled on a standard formula prior to authorizing medical necessity for specialty formula. Most current ADA and ASPEN guidelines also do not recommend choosing a specialty (diabetes specific) formula solely based on history of DM, but rather focus on optimizing glycemic control through protocols for insulin management to ensure adequate glycemic control/ coverage. Pt wife reports she still does not agree with RD assessment and formula choice PTA despite review, further explanation, and evidence based protocols/ standards of care. Pt wife states that there is no amount of evidence that could be presented that would change her mind and will only accept a diabetes specific formula for pt's TF regimen.   RD also spent time discussing interventions and changes that have been made since this  admission to optimize glycemic control and overall nutritional status.   RD discussed TF regimen that pt is currently receiving and provided instructions for home use. RD provided detailed instructions on printed paper and also on AVS/ discharge instructions. Pt wife reports concern over pt feeling hungry during meals; stated his stomach was growling when she visited yesterday. RD discussed possibility to spacing out TF out the help with satiety, which wife was agreeable to. RD also discussed basics of PEG care and TF management, including how formula, water flushes, and interventions were in place to help pt meet his nutrition and hydration needs and best care with maintenance water flushes/ medication administration to ensure tube patency. Pt wife shares that she sometimes will add an additional water flush (50 ml per day BID) if she feels like this is needed to help ensure adequate medications administration and hydration; RD discussed how much 50-100 ml looked like using hospital syringe and assured that that amount of water daily would not cause harm to pt based on current regimen in place. RD reviewed plan of care with pt wife and discussed that this plan could be altered once pt transitions to next venue of care.   RD also discussed transition of care from hospital to SNF and how TF formula could be provided to facility so discharge was not delayed. Also discussed continued follow-up with [facility] and RD at SNF. In discussing  transitions of care, RD emphasized that TF regimen is flexible and able to be altered to best meet the need of the pt. RD provided examples such as adjusting timing of feeds to prevent missed doses for medical appointments, how regimen could be altered based upon weight trends/ further nutrition needs and what management might look like if these situations were to occur (ex administering TF dose early to accommodate for medical transport etc). In discussing this, RD also made  note that SNF facility may not have access to the same TF formulary as this hospital has access to and formula/ regimen may need to be altered due to this, however, facility would be responsible for pt's TF management once pt enters the facility. Pt wife also expressed concern about availability of banatrol; RD also explained that this RD was unsure of what type of products facility had access to. RD provided wife with options about what could be done going forward (1- continue banatrol until discharge; 2- discuss other fiber supplements ex nutritisource fiber to trial; 3- d/c to banatrol to see how pt does with the option of reinstating it- reviewed that there are also other therapies on board such as lomotil to help with management of diarrhea). Wife agreed to trial of discontinuation of banatrol with close observation. RD again reviewed current plan of care and changes made to ensure that wife understood and agreed to care plan. RD also provided written instructions of current TF regimen and again discussed current management and changes in care today. Pt wife expressed understanding and thanked RD for time spent with her.   RD spent over 120 minutes collecting pt data, collaborating with medical team, evaluating pt, speaking with pt wife, as well as overall coordination of care.   Medications reviewed and include vitamin C, vitamin D3, vitamin B-12, cardizem, and lokelma.   Per TOC notes, plan for SNF placement ([facility]) once medically stable.    Palliative care following for goals of care discussions; plan for full scope treatment at this time.   Labs reviewed: K: 5.2, CBGS: 166-203 (inpatient orders for glycemic control are 0-5 units insulin aspart daily at bedtime, 0-9 units insulin aspart TID with meals, and 25 units insulin glargine-yfgn daily).    NUTRITION - FOCUSED PHYSICAL EXAM:  Flowsheet Row Most Recent Value  Orbital Region No depletion  Upper Arm Region Mild depletion   Thoracic and Lumbar Region No depletion  Buccal Region Mild depletion  Temple Region Mild depletion  Clavicle Bone Region No depletion  Clavicle and Acromion Bone Region No depletion  Scapular Bone Region No depletion  Dorsal Hand Mild depletion  Patellar Region No depletion  Anterior Thigh Region No depletion  Posterior Calf Region No depletion  Edema (RD Assessment) Mild  Hair Reviewed  Eyes Reviewed  Mouth Reviewed  Skin Reviewed  Nails Reviewed       Diet Order:   Diet Order     None       EDUCATION NEEDS:   Education needs have been addressed  Skin:  Skin Assessment: Reviewed RN Assessment  Last BM:  04/17/23 (type 7)  Height:   Ht Readings from Last 1 Encounters:  04/02/23 5\' 5"  (1.651 m)    Weight:   Wt Readings from Last 1 Encounters:  04/17/23 115 kg    Ideal Body Weight:  75.2 kg (adjusted for bilateral BKAs)  BMI:  Body mass index is 42.19 kg/m.  Estimated Nutritional Needs:   Kcal:  2250-2450  Protein:  115-130 grams  Fluid:  > 2 L    Levada Schilling, RD, LDN, CDCES Registered Dietitian III Certified Diabetes Care and Education Specialist If unable to reach this RD, please use "RD Inpatient" group chat on secure chat between hours of 8am-4 pm daily

## 2023-04-17 NOTE — Plan of Care (Signed)
  Problem: Education: Goal: Ability to describe self-care measures that may prevent or decrease complications (Diabetes Survival Skills Education) will improve Outcome: Progressing   Problem: Coping: Goal: Ability to adjust to condition or change in health will improve Outcome: Progressing   Problem: Fluid Volume: Goal: Ability to maintain a balanced intake and output will improve Outcome: Progressing   Problem: Health Behavior/Discharge Planning: Goal: Ability to identify and utilize available resources and services will improve Outcome: Progressing   Problem: Metabolic: Goal: Ability to maintain appropriate glucose levels will improve Outcome: Progressing   Problem: Nutritional: Goal: Maintenance of adequate nutrition will improve Outcome: Progressing Goal: Progress toward achieving an optimal weight will improve Outcome: Progressing   Problem: Skin Integrity: Goal: Risk for impaired skin integrity will decrease Outcome: Progressing   Problem: Education: Goal: Knowledge of General Education information will improve Description: Including pain rating scale, medication(s)/side effects and non-pharmacologic comfort measures Outcome: Progressing   Problem: Health Behavior/Discharge Planning: Goal: Ability to manage health-related needs will improve Outcome: Progressing   Problem: Clinical Measurements: Goal: Ability to maintain clinical measurements within normal limits will improve Outcome: Progressing Goal: Will remain free from infection Outcome: Progressing Goal: Diagnostic test results will improve Outcome: Progressing Goal: Respiratory complications will improve Outcome: Progressing Goal: Cardiovascular complication will be avoided Outcome: Progressing   Problem: Activity: Goal: Risk for activity intolerance will decrease Outcome: Progressing   Problem: Nutrition: Goal: Adequate nutrition will be maintained Outcome: Progressing   Problem: Coping: Goal:  Level of anxiety will decrease Outcome: Progressing   Problem: Elimination: Goal: Will not experience complications related to bowel motility Outcome: Progressing Goal: Will not experience complications related to urinary retention Outcome: Progressing   Problem: Pain Management: Goal: General experience of comfort will improve Outcome: Progressing   Problem: Safety: Goal: Ability to remain free from injury will improve Outcome: Progressing   Problem: Skin Integrity: Goal: Risk for impaired skin integrity will decrease Outcome: Progressing

## 2023-04-17 NOTE — Progress Notes (Signed)
Physical Therapy Treatment Patient Details Name: [name]. MRN: [id number] DOB: [birth year] Today's Date: 04/17/2023   History of Present Illness Pt is a 67 yo male pt admitted for hyperkalemia, AKI, UTI, and perinephric abscess. PMH includes Afib, asthma, CHF, COPD, CAD, depression, PVD, and esophageal cancer. Currently with PEG tube placed. Of note B LE BKA.    PT Comments  Pt was long sitting in bed with supportive spouse at bedside. He is alert and cooperative but still disoriented/ pleasantly confused. Pt greets Chartered loss adjuster," What are you doing here?" When ask he states he is in the hospital but later in session thinks he is at home. Pt is well known by Chartered loss adjuster. He is not at cognition state he was just 3 weeks prior. Pt did agree to session and was able to sit EOB x ~ 12 minutes. He tolerates exercises well but still fatigues quickly. DC recs remain appropriate. Acute PT will continue to follow per current POC.     If plan is discharge home, recommend the following: Two people to help with walking and/or transfers;A lot of help with bathing/dressing/bathroom;Assistance with cooking/housework;Assistance with feeding;Direct supervision/assist for medications management;Direct supervision/assist for financial management;Assist for transportation;Help with stairs or ramp for entrance;Supervision due to cognitive status (tube feeds)     Equipment Recommendations  Other (comment) (defer to next level of care)       Precautions / Restrictions Precautions Precautions: Fall Precaution Comments: BLE BKA, no prosthetics in room Restrictions Weight Bearing Restrictions Per Provider Order: No RLE Weight Bearing Per Provider Order: Non weight bearing LLE Weight Bearing Per Provider Order: Non weight bearing     Mobility  Bed Mobility Overal bed mobility: Needs Assistance Bed Mobility: Supine to Sit, Sit to Supine  Supine to sit: Supervision Sit to supine: Min assist, Mod assist General  bed mobility comments: pt did not need assistance to acheive EOB short sit however still needs assistance to return to bed after EOB activity    Transfers  General transfer comment: Unsafe at this time. Mechanical lift most appropriate/only safe transfer OOB option     Balance Overall balance assessment: Needs assistance Sitting-balance support: Feet unsupported, No upper extremity supported, Single extremity supported Sitting balance-Leahy Scale: Fair     Standing balance support: Reliant on assistive device for balance, Bilateral upper extremity supported Standing balance-Leahy Scale: Poor    Cognition Arousal: Alert Behavior During Therapy: WFL for tasks assessed/performed Overall Cognitive Status: Impaired/Different from baseline    General Comments: Pt is still not at his baseline cognitively. Pleasantly confused           General Comments General comments (skin integrity, edema, etc.): Pt performed LAQ 2 x 10 BLEs, Seated marching non stop x ~2 minutes. In supine; performed iso hip extension 2 x 10, manually resisted hip abduction      Pertinent Vitals/Pain Pain Assessment Pain Assessment: 0-10 Pain Score: 3  Pain Location: back pain Pain Intervention(s): Limited activity within patient's tolerance, Monitored during session, Premedicated before session, Repositioned     PT Goals (current goals can now be found in the care plan section) Acute Rehab PT Goals Patient Stated Goal: get home Progress towards PT goals: Progressing toward goals    Frequency    Min 1X/week       AM-PAC PT "6 Clicks" Mobility   Outcome Measure  Help needed turning from your back to your side while in a flat bed without using bedrails?: A Lot Help needed moving from lying on  your back to sitting on the side of a flat bed without using bedrails?: A Lot Help needed moving to and from a bed to a chair (including a wheelchair)?: Total Help needed standing up from a chair using your arms  (e.g., wheelchair or bedside chair)?: Total Help needed to walk in hospital room?: Total Help needed climbing 3-5 steps with a railing? : Total 6 Click Score: 8    End of Session   Activity Tolerance: Patient limited by fatigue Patient left: in bed;with call bell/phone within reach;with bed alarm set Nurse Communication: Mobility status;Other (comment) PT Visit Diagnosis: Muscle weakness (generalized) (M62.81);Other abnormalities of gait and mobility (R26.89)     Time: 8182-9937 PT Time Calculation (min) (ACUTE ONLY): 17 min  Charges:    $Therapeutic Activity: 8-22 mins PT General Charges $$ ACUTE PT VISIT: 1 Visit                     Jetta Lout PTA 04/17/23, 4:42 PM

## 2023-04-17 NOTE — Progress Notes (Signed)
OT Cancellation Note  Patient Details Name: [name]. MRN: [id number] DOB: Mar 28, 1956   Cancelled Treatment:    Reason Eval/Treat Not Completed: Other (comment). Discussed with treating PT, pt fatigued after session, confused. Will re-attempt as able.  Quasean Frye L. Andersen Mckiver, OTR/L  04/17/23, 4:40 PM

## 2023-04-17 NOTE — Care Management Important Message (Signed)
Important Message  Patient Details  Name: [name]. MRN: [id number] Date of Birth: 05/11/1955   Important Message Given:  Yes - Medicare IM     Bernadette Hoit 04/17/2023, 3:08 PM

## 2023-04-18 ENCOUNTER — Ambulatory Visit: Payer: Medicare Other | Admitting: Internal Medicine

## 2023-04-18 ENCOUNTER — Other Ambulatory Visit: Payer: Medicare Other

## 2023-04-18 ENCOUNTER — Ambulatory Visit: Payer: Medicare Other

## 2023-04-18 DIAGNOSIS — E875 Hyperkalemia: Secondary | ICD-10-CM | POA: Diagnosis not present

## 2023-04-18 DIAGNOSIS — R410 Disorientation, unspecified: Secondary | ICD-10-CM | POA: Diagnosis not present

## 2023-04-18 DIAGNOSIS — N179 Acute kidney failure, unspecified: Secondary | ICD-10-CM | POA: Diagnosis not present

## 2023-04-18 LAB — GLUCOSE, CAPILLARY
Glucose-Capillary: 257 mg/dL — ABNORMAL HIGH (ref 70–99)
Glucose-Capillary: 267 mg/dL — ABNORMAL HIGH (ref 70–99)
Glucose-Capillary: 257 mg/dL — ABNORMAL HIGH (ref 70–99)
Glucose-Capillary: 242 mg/dL — ABNORMAL HIGH (ref 70–99)

## 2023-04-18 LAB — POTASSIUM: Potassium: 5.3 mmol/L — ABNORMAL HIGH (ref 3.5–5.1)

## 2023-04-18 MED ORDER — METOPROLOL TARTRATE 25 MG PO TABS
25.0000 mg | ORAL_TABLET | Freq: Four times a day (QID) | ORAL | Status: DC
  Administered 2023-04-18 – 2023-04-21 (×10): 25 mg
  Filled 2023-04-18 (×12): qty 1

## 2023-04-18 MED ORDER — DILTIAZEM HCL ER COATED BEADS 120 MG PO CP24: 240.0000 mg | ORAL_CAPSULE | Freq: Every day | ORAL | Status: DC

## 2023-04-18 MED ORDER — DILTIAZEM HCL 60 MG PO TABS
60.0000 mg | ORAL_TABLET | Freq: Four times a day (QID) | ORAL | Status: DC
  Administered 2023-04-18 – 2023-04-21 (×12): 60 mg
  Filled 2023-04-18: qty 1
  Filled 2023-04-18 (×2): qty 2
  Filled 2023-04-18: qty 1
  Filled 2023-04-18 (×2): qty 2
  Filled 2023-04-18: qty 1
  Filled 2023-04-18: qty 2
  Filled 2023-04-18: qty 1
  Filled 2023-04-18: qty 2
  Filled 2023-04-18 (×2): qty 1
  Filled 2023-04-18: qty 2
  Filled 2023-04-18 (×6): qty 1
  Filled 2023-04-18 (×3): qty 2
  Filled 2023-04-18: qty 1
  Filled 2023-04-18: qty 2

## 2023-04-18 MED ORDER — METOPROLOL TARTRATE 25 MG PO TABS: 25.0000 mg | ORAL_TABLET | Freq: Four times a day (QID) | ORAL | Status: DC

## 2023-04-18 MED ORDER — METOPROLOL SUCCINATE ER 100 MG PO TB24: 100.0000 mg | ORAL_TABLET | Freq: Every day | ORAL | Status: DC

## 2023-04-18 MED ORDER — BANATROL TF EN LIQD
60.0000 mL | Freq: Three times a day (TID) | ENTERAL | Status: DC
Start: 2023-04-18 — End: 2023-04-29
  Administered 2023-04-18 – 2023-04-28 (×23): 60 mL

## 2023-04-18 NOTE — Anesthesia Postprocedure Evaluation (Signed)
Anesthesia Post Note  Patient: [name].  Procedure(s) Performed: FLEXIBLE SIGMOIDOSCOPY BIOPSY  Patient location during evaluation: Endoscopy Anesthesia Type: General Level of consciousness: awake and alert Pain management: pain level controlled Vital Signs Assessment: post-procedure vital signs reviewed and stable Respiratory status: spontaneous breathing, nonlabored ventilation, respiratory function stable and patient connected to nasal cannula oxygen Cardiovascular status: blood pressure returned to baseline and stable Postop Assessment: no apparent nausea or vomiting Anesthetic complications: no   No notable events documented.   Last Vitals:  Vitals:   04/18/23 1714 04/18/23 2110  BP: 103/69 101/65  Pulse: (!) 115   Resp: (!) 21 (!) 21  Temp: 36.7 C 36.6 C  SpO2: 94% 94%    Last Pain:  Vitals:   04/18/23 2110  TempSrc:   PainSc: 0-No pain                 Lenard Simmer

## 2023-04-18 NOTE — Progress Notes (Signed)
PROGRESS NOTE    [name].  XLK:[id number] DOB: October 31, 1955 DOA: 03/28/2023 PCP: [name], MD    Brief Narrative:   [name] is a 67 y.o. male with medical history significant of atrial fibrillation, asthma, congestive heart failure, COPD, coronary artery disease, depression, diabetes mellitus, gout, hyperlipidemia, hypertension, hypogonadism in male, morbid obesity, peripheral vascular disease, OSA on CPAP at night, esophageal cancer status post chemo and G-tube placement.  Patient currently being managed for hyperkalemia, severe dehydration and AKI and acute metabolic encephalopathy.   12/15: Lokelma ordered for potassium of 5.2 12/16: MRI brain neg, ammonia normal, started PO amiodaone 12/17: SNF search and insurance Auth started, last day for Vanco 12/18: waiting for SNF 12/19: Patent examiner & Plan:   Principal Problem:   Hyperkalemia Active Problems:   Delirium   Acute kidney injury (HCC)   Malnutrition of moderate degree   Acute hyperkalemia   Diarrhea   Perinephric fluid collection   Persistent atrial fibrillation (HCC)   Typical atrial flutter (HCC)   Diarrhea of infectious origin   Atrial flutter with rapid ventricular response (HCC)   Malignant neoplasm of lower third of esophagus (HCC)   Palliative care encounter  Acute kidney injury secondary to dehydration Initially resolved now with up-trending cr in setting of ongoing diarrhea -Creatinine normalized   Delirium Intermittent.  Wife concerned that UTI may be contributing.  Mental status seems okay. He does intermittent confusion MRI brain neg for acute patho. Ammonia normal - haldol prn -completed course of Rocephin for UTI.     End-of-life care Patient remains a full scope of treatment, full code   Bacteriuria Klebsiella UTI It is possible that Klebsiella in the urine represents chronic bacteria and colonization.  However given mentation altered  from baseline it is not unreasonable to give patient a trial of intravenous antibiotics to assess for any improvement in mentation.  If there is any deterioration from a C. difficile standpoint we will need to will hold on UTI treatment. Rocephin 1 g every 24 hours per sensitivities.  5-day course.  Last dose 12/15. Wife was asking if we could restart antibiotic or check UA.  I have made clear that if she wants to do UA and if UA is clear we cannot start antibiotic.  She prefers to just empirically give him short course of antibiotics at discharge.  Acute diarrhea secondary to C. difficile infection Continues to have profuse watery BMs multiple times a day Per GI this could be related to tube feeds which can produce 6 watery bowel movements daily Patient is status post 10 days of Dificid.  Vancomycin added by GI on 12/8 Status post flexible sigmoidoscopy on 12/11.  Normal-appearing mucosa.  Biopsies taken.  Diarrhea has been improving as of 12/12 Continue p.o. vancomycin 500 mg per tube twice daily, 10 day course recommended at this time.  2 more doses of vancomycin and it can be stopped after today Diarrhea improving.  Remaining loose stools may be secondary to tube feeds.  Imodium as needed.  As per patient it seems to work well at home  Possible right perinephric hematoma --Described as possible abscess by radiology but per secure chat communication w/ Dr. Apolinar Junes on 12/9, this is a chornic complex hematoma dating back over a year, nothing that has been drainable thus far, possible underlying renal mass, and in light of significant comorbidities and as he is asymptomatic inpatient evaluation has been deferred, plan is outpatient urology  f/u, consider MRI then.   Severe life-threatening hyperkalemia Has required daily lokelma for the past several days - continue daily lokelma for now.  Dose decreased to 5g every day  Potassium normalized now.  So far he has received 4 doses of Lokelma 10 g daily  from 12/10 - 12/13    Chronic persistent atrial flutter with rvr Cardiology now following. Remains in rvr, hemodynamically stable - HR remains in 120s at rest and with minimal activity in 140-150s -Cardioversion without TEE can be performed after December 23 per cardio.  This could be done as an outpatient if patient has SNF bed available - continue apixaban -Continue Cardizem, metoprolol Amio infusion discontinued per cardiology in the past as ineffective amiodarone 200 mg by mouth twice daily restarted in preparation for possible cardioversion after December 23  Urinary retention Resolved   Chronic diastolic congestive heart failure --Recent EF of 55% on echo obtained on November 2024   COPD --Continue as needed nebulization    Coronary artery disease,  --Continue aspirin therapy   Diabetes mellitus type 2 with hyperglycemia -- Continue Semglee 25 units nightly (previous dose 35 units)   Hypothyroidism --on levothyroxine     Nutrition Status: Nutrition Problem: Moderate Malnutrition Etiology: chronic illness (esopahgeal mass, likely malginant) Signs/Symptoms: mild fat depletion, mild muscle depletion Interventions: Tube feeding   Peripheral vascular disease S/p bilateral BKAs --Continue aspirin  --Patient is allergic to statin therapy   OSA on CPAP at night,  --CPAP at night   Esophageal cancer status post chemo and G-tube placement. --pt gets Bolus TF --Restarted post endoscopy   DVT prophylaxis: Apixaban Code Status: Full Family Communication: None at bedside Disposition Plan: Status is: Inpatient Remains inpatient appropriate because: Multiple acute issues as above, waiting for insurance Auth for SNF   Level of care: Progressive  Consultants:  GI Oncology  Procedures:  Flexible sigmoidoscopy  Antimicrobials: Vanco completed   Subjective:  No new issues.  Hopeful to get out of here  Objective: Vitals:   04/18/23 0510 04/18/23 0529 04/18/23  0807 04/18/23 1142  BP: (!) 88/72 105/76 106/71 101/81  Pulse: 87   (!) 117  Resp: (!) 21 (!) 22 (!) 21 19  Temp:   97.9 F (36.6 C) 97.8 F (36.6 C)  TempSrc:   Oral Oral  SpO2: 92%  94% 95%  Weight:      Height:        Intake/Output Summary (Last 24 hours) at 04/18/2023 1214 Last data filed at 04/18/2023 1610 Gross per 24 hour  Intake 1348 ml  Output 1250 ml  Net 98 ml    Filed Weights   04/15/23 0500 04/17/23 0349 04/18/23 0500  Weight: 123 kg 115 kg 120.9 kg    Examination:  General exam: No acute distress.  Appears fatigued and chronically ill Respiratory system: Decreased breath sounds at right base.  Normal work of breathing.  Room air Cardiovascular system: S1-S2, tachycardic, irregular rhythm, no murmurs Gastrointestinal system: Soft, nondistended, hyperactive bowel sounds, + PEG Central nervous system: Alert.  Oriented x 2.  No focal deficits Extremities: Bilateral BKA Skin: No rashes, lesions or ulcers Psychiatry: Judgement and insight appear normal. Mood & affect appropriate.     Data Reviewed: I have personally reviewed following labs and imaging studies  CBC: Recent Labs  Lab 04/12/23 0512 04/13/23 0415 04/15/23 0629 04/16/23 0504 04/17/23 0454  WBC 11.1* 11.3* 10.2 10.6* 9.8  HGB 11.6* 11.6* 11.7* 12.6* 12.5*  HCT 36.4* 35.6* 36.8* 39.5 39.5  MCV 90.5 89.9 92.0 90.6 91.6  PLT 285 303 253 251 282   Basic Metabolic Panel: Recent Labs  Lab 04/13/23 0415 04/14/23 0447 04/15/23 0629 04/16/23 0504 04/17/23 0712 04/18/23 0829  NA 129* 135 135 135 135  --   K 4.8 5.2* 4.8 5.5* 5.2* 5.3*  CL 100 104 104 104 103  --   CO2 23 24 22 22 24   --   GLUCOSE 193* 196* 181* 193* 241*  --   BUN 32* 32* 33* 31* 38*  --   CREATININE 0.91 1.05 1.00 1.05 1.11  --   CALCIUM 8.3* 8.8* 8.8* 8.9 8.9  --   MG  --  2.3 2.3 2.4 2.3  --   PHOS  --  4.4 4.0 4.3 4.4  --     CBG: Recent Labs  Lab 04/17/23 0908 04/17/23 1203 04/17/23 1629 04/17/23 2116  04/18/23 0833  GLUCAP 203* 237* 235* 184* 257*    Sepsis Labs: No results for input(s): "PROCALCITON", "LATICACIDVEN" in the last 168 hours.   No results found for this or any previous visit (from the past 240 hours).        Radiology Studies: No results found.       Scheduled Meds:  amiodarone  200 mg Oral BID   apixaban  5 mg Oral BID   ascorbic acid  500 mg Oral Daily   aspirin  81 mg Oral Daily   cholecalciferol  2,000 Units Oral Daily   clonazePAM  1 mg Oral BID   cyanocobalamin  10,000 mcg Oral Daily   diltiazem  60 mg Per Tube Q6H   ezetimibe  10 mg Oral Daily   feeding supplement (GLUCERNA 1.5 CAL)  237 mL Per Tube 6 X Daily   ferrous sulfate  300 mg Oral Daily   free water  140 mL Per Tube 6 X Daily   insulin aspart  0-5 Units Subcutaneous QHS   insulin aspart  0-9 Units Subcutaneous TID WC   insulin glargine-yfgn  25 Units Subcutaneous QHS   levothyroxine  25 mcg Oral Q0600   metoprolol tartrate  25 mg Per Tube Q6H   multivitamin with minerals  1 tablet Oral Daily   pramipexole  2 mg Oral QHS   primidone  250 mg Oral BID   sodium zirconium cyclosilicate  10 g Oral Daily   Continuous Infusions:     LOS: 21 days   Time spent 35 minutes   Delfino Lovett, MD [facility]   If 7PM-7AM, please contact night-coverage  04/18/2023, 12:14 PM

## 2023-04-18 NOTE — Consult Note (Signed)
PHARMACY CONSULT NOTE - ELECTROLYTES  Pharmacy Consult for Electrolyte Monitoring and Replacement   Recent Labs: Height: 5\' 5"  (165.1 cm) (Bilateral BKAs) Weight: 120.9 kg (266 lb 8.6 oz) IBW/kg (Calculated) : 61.5 Estimated Creatinine Clearance: 76.8 mL/min (by C-G formula based on SCr of 1.11 mg/dL).  Potassium (mmol/L)  Date Value  04/18/2023 5.3 (H)  07/12/2011 4.4   Magnesium (mg/dL)  Date Value  96/29/5284 2.3   Calcium (mg/dL)  Date Value  13/24/4010 8.9   Calcium, Total (mg/dL)  Date Value  27/25/3664 8.8   Albumin (g/dL)  Date Value  40/34/7425 2.4 (L)   Phosphorus (mg/dL)  Date Value  95/63/8756 4.4   Sodium (mmol/L)  Date Value  04/17/2023 135  07/12/2011 141   Assessment  Alec A Sinatra Montez Hageman. is a 67 y.o. male presenting with hyperkalemia, severe dehydration and AKI. PMH significant for atrial fibrillation, asthma, congestive heart failure, COPD, coronary artery disease, depression, diabetes mellitus, gout, hyperlipidemia, hypertension, hypogonadism in male, morbid obesity, peripheral vascular disease, OSA on CPAP at night, esophageal cancer status post chemo and G-tube placement. Pharmacy has been consulted to monitor and replace electrolytes.  Diet: Tube feeds MIVF: None, free water 200 ml TID  Goal of Therapy: K+ > 4 Mg > 2   Plan:  Lokelma increased from 5g to 10g daily on 12/18. Potassium stable. Will continue for another day and may need to increase to twice daily if still elevated.   F/u with AM labs.   Thank you for allowing pharmacy to be a part of this patient's care.  Ronnald Ramp, PharmD 04/18/2023 9:53 AM

## 2023-04-18 NOTE — TOC Progression Note (Signed)
Transition of Care (TOC) - Progression Note    Patient Details  Name: [name]. MRN: [id number] Date of Birth: 1956/03/21  Transition of Care [facility]) CM/SW Contact  Truddie Hidden, RN Phone Number: 04/18/2023, 9:46 AM  Clinical Narrative:    Per Babette Relic, Admissions Director at [facility] is still pending.          Expected Discharge Plan and Services                                               Social Determinants of Health (SDOH) Interventions SDOH Screenings   Food Insecurity: No Food Insecurity (03/29/2023)  Housing: Low Risk  (03/29/2023)  Transportation Needs: No Transportation Needs (03/29/2023)  Utilities: Not At Risk (03/29/2023)  Depression (PHQ2-9): Low Risk  (11/21/2021)  Financial Resource Strain: Low Risk  (09/28/2022)   [facility], [facility]  Tobacco Use: Medium Risk (04/10/2023)    Readmission Risk Interventions    02/13/2022    4:57 PM 09/29/2021   11:12 AM  Readmission Risk Prevention Plan  Transportation Screening Complete Complete  PCP or Specialist Appt within 3-5 Days  Complete  HRI or Home Care Consult  Complete  Social Work Consult for Recovery Care Planning/Counseling  Complete  Palliative Care Screening  Not Applicable  Medication Review Oceanographer) Complete Complete  PCP or Specialist appointment within 3-5 days of discharge Complete   HRI or Home Care Consult Complete   SW Recovery Care/Counseling Consult Complete   Palliative Care Screening Not Applicable   Skilled Nursing Facility Not Applicable

## 2023-04-18 NOTE — Plan of Care (Signed)
  Problem: Education: Goal: Ability to describe self-care measures that may prevent or decrease complications (Diabetes Survival Skills Education) will improve Outcome: Progressing   Problem: Fluid Volume: Goal: Ability to maintain a balanced intake and output will improve Outcome: Progressing   Problem: Nutritional: Goal: Maintenance of adequate nutrition will improve Outcome: Progressing Goal: Progress toward achieving an optimal weight will improve Outcome: Progressing   Problem: Skin Integrity: Goal: Risk for impaired skin integrity will decrease Outcome: Progressing   Problem: Elimination: Goal: Will not experience complications related to bowel motility Outcome: Progressing   Problem: Clinical Measurements: Goal: Will remain free from infection Outcome: Progressing

## 2023-04-19 ENCOUNTER — Inpatient Hospital Stay: Payer: Self-pay | Admitting: Anesthesiology

## 2023-04-19 DIAGNOSIS — K909 Intestinal malabsorption, unspecified: Secondary | ICD-10-CM | POA: Diagnosis not present

## 2023-04-19 DIAGNOSIS — E875 Hyperkalemia: Secondary | ICD-10-CM | POA: Diagnosis not present

## 2023-04-19 DIAGNOSIS — N179 Acute kidney failure, unspecified: Secondary | ICD-10-CM | POA: Diagnosis not present

## 2023-04-19 DIAGNOSIS — R410 Disorientation, unspecified: Secondary | ICD-10-CM | POA: Diagnosis not present

## 2023-04-19 LAB — BASIC METABOLIC PANEL
Anion gap: 10 (ref 5–15)
BUN: 33 mg/dL — ABNORMAL HIGH (ref 8–23)
CO2: 25 mmol/L (ref 22–32)
Calcium: 9.1 mg/dL (ref 8.9–10.3)
Chloride: 99 mmol/L (ref 98–111)
Creatinine, Ser: 0.92 mg/dL (ref 0.61–1.24)
GFR, Estimated: >60 mL/min (ref >60)
Glucose, Bld: 254 mg/dL — ABNORMAL HIGH (ref 70–99)
Potassium: 5.5 mmol/L — ABNORMAL HIGH (ref 3.5–5.1)
Sodium: 134 mmol/L — ABNORMAL LOW (ref 135–145)

## 2023-04-19 LAB — CBC
HCT: 36.9 % — ABNORMAL LOW (ref 39.0–52.0)
Hemoglobin: 12 g/dL — ABNORMAL LOW (ref 13.0–17.0)
MCH: 29.5 pg (ref 26.0–34.0)
MCHC: 32.5 g/dL (ref 30.0–36.0)
MCV: 90.7 fL (ref 80.0–100.0)
Platelets: 287 10*3/uL (ref 150–400)
RBC: 4.07 MIL/uL — ABNORMAL LOW (ref 4.22–5.81)
RDW: 14.3 % (ref 11.5–15.5)
WBC: 12.4 10*3/uL — ABNORMAL HIGH (ref 4.0–10.5)
nRBC: 0 % (ref 0.0–0.2)

## 2023-04-19 LAB — GLUCOSE, CAPILLARY
Glucose-Capillary: 247 mg/dL — ABNORMAL HIGH (ref 70–99)
Glucose-Capillary: 238 mg/dL — ABNORMAL HIGH (ref 70–99)
Glucose-Capillary: 269 mg/dL — ABNORMAL HIGH (ref 70–99)
Glucose-Capillary: 236 mg/dL — ABNORMAL HIGH (ref 70–99)
Glucose-Capillary: 186 mg/dL — ABNORMAL HIGH (ref 70–99)

## 2023-04-19 MED ORDER — SODIUM ZIRCONIUM CYCLOSILICATE 10 G PO PACK
10.0000 g | PACK | Freq: Once | ORAL | Status: AC
  Administered 2023-04-19: 10 g via ORAL
  Filled 2023-04-19: qty 1

## 2023-04-19 MED ORDER — ACETAMINOPHEN 325 MG PO TABS
650.0000 mg | ORAL_TABLET | Freq: Four times a day (QID) | ORAL | Status: DC | PRN
  Administered 2023-04-24 – 2023-04-30 (×3): 650 mg
  Filled 2023-04-19 (×3): qty 2

## 2023-04-19 MED ORDER — APIXABAN 5 MG PO TABS
5.0000 mg | ORAL_TABLET | Freq: Two times a day (BID) | ORAL | Status: DC
  Administered 2023-04-19 – 2023-05-03 (×29): 5 mg
  Filled 2023-04-19 (×30): qty 1

## 2023-04-19 MED ORDER — FERROUS SULFATE 220 (44 FE) MG/5ML PO SOLN
300.0000 mg | Freq: Every day | ORAL | Status: DC
  Administered 2023-04-19 – 2023-05-03 (×15): 300 mg
  Filled 2023-04-19 (×14): qty 6.82
  Filled 2023-04-19: qty 6.9

## 2023-04-19 MED ORDER — ASPIRIN 81 MG PO CHEW
81.0000 mg | CHEWABLE_TABLET | Freq: Every day | ORAL | Status: DC
  Administered 2023-04-19 – 2023-04-24 (×6): 81 mg
  Filled 2023-04-19 (×6): qty 1

## 2023-04-19 MED ORDER — ADULT MULTIVITAMIN W/MINERALS CH
1.0000 | ORAL_TABLET | Freq: Every day | ORAL | Status: DC
  Administered 2023-04-19 – 2023-05-03 (×15): 1
  Filled 2023-04-19 (×15): qty 1

## 2023-04-19 MED ORDER — EZETIMIBE 10 MG PO TABS
10.0000 mg | ORAL_TABLET | Freq: Every day | ORAL | Status: DC
Start: 2023-04-19 — End: 2023-05-03
  Administered 2023-04-19 – 2023-05-03 (×15): 10 mg
  Filled 2023-04-19 (×15): qty 1

## 2023-04-19 MED ORDER — CLONAZEPAM 1 MG PO TABS
0.5000 mg | ORAL_TABLET | Freq: Once | ORAL | Status: DC
  Filled 2023-04-19: qty 1

## 2023-04-19 MED ORDER — PRIMIDONE 250 MG PO TABS
250.0000 mg | ORAL_TABLET | Freq: Two times a day (BID) | ORAL | Status: DC
  Administered 2023-04-19 – 2023-05-03 (×28): 250 mg
  Filled 2023-04-19 (×30): qty 1

## 2023-04-19 MED ORDER — LEVOTHYROXINE SODIUM 25 MCG PO TABS
25.0000 ug | ORAL_TABLET | Freq: Every day | ORAL | Status: DC
Start: 2023-04-20 — End: 2023-05-03
  Administered 2023-04-20 – 2023-05-03 (×13): 25 ug
  Filled 2023-04-19 (×14): qty 1

## 2023-04-19 MED ORDER — VITAMIN B-12 1000 MCG PO TABS
10000.0000 ug | ORAL_TABLET | Freq: Every day | ORAL | Status: DC
Start: 2023-04-19 — End: 2023-05-03
  Administered 2023-04-19 – 2023-05-03 (×15): 10000 ug
  Filled 2023-04-19 (×15): qty 10

## 2023-04-19 MED ORDER — LOPERAMIDE HCL 1 MG/7.5ML PO SUSP
2.0000 mg | Freq: Four times a day (QID) | ORAL | Status: DC | PRN
  Administered 2023-04-19 – 2023-05-03 (×16): 2 mg
  Filled 2023-04-19 (×22): qty 15

## 2023-04-19 MED ORDER — INSULIN GLARGINE-YFGN 100 UNIT/ML ~~LOC~~ SOLN
30.0000 [IU] | Freq: Every day | SUBCUTANEOUS | Status: DC
  Administered 2023-04-19 – 2023-04-25 (×7): 30 [IU] via SUBCUTANEOUS
  Filled 2023-04-19 (×7): qty 0.3

## 2023-04-19 MED ORDER — PRAMIPEXOLE DIHYDROCHLORIDE 1 MG PO TABS
2.0000 mg | ORAL_TABLET | Freq: Every day | ORAL | Status: DC
Start: 2023-04-19 — End: 2023-05-03
  Administered 2023-04-19 – 2023-05-02 (×13): 2 mg
  Filled 2023-04-19 (×15): qty 2

## 2023-04-19 MED ORDER — VITAMIN C 500 MG PO TABS
500.0000 mg | ORAL_TABLET | Freq: Every day | ORAL | Status: DC
Start: 2023-04-19 — End: 2023-05-03
  Administered 2023-04-19 – 2023-05-03 (×15): 500 mg
  Filled 2023-04-19 (×14): qty 1

## 2023-04-19 MED ORDER — CLONAZEPAM 1 MG PO TABS
1.0000 mg | ORAL_TABLET | Freq: Two times a day (BID) | ORAL | Status: DC
Start: 2023-04-19 — End: 2023-04-19
  Filled 2023-04-19: qty 1

## 2023-04-19 MED ORDER — CLONAZEPAM 0.5 MG PO TABS
0.5000 mg | ORAL_TABLET | Freq: Two times a day (BID) | ORAL | Status: DC
Start: 2023-04-19 — End: 2023-04-25
  Administered 2023-04-19 – 2023-04-24 (×12): 0.5 mg
  Filled 2023-04-19 (×11): qty 1

## 2023-04-19 MED ORDER — SODIUM ZIRCONIUM CYCLOSILICATE 10 G PO PACK
10.0000 g | PACK | Freq: Every day | ORAL | Status: DC
  Administered 2023-04-19 – 2023-04-22 (×4): 10 g
  Filled 2023-04-19 (×5): qty 1

## 2023-04-19 MED ORDER — FUROSEMIDE 40 MG PO TABS
40.0000 mg | ORAL_TABLET | Freq: Every day | ORAL | Status: DC
Start: 2023-04-19 — End: 2023-04-20
  Administered 2023-04-19: 40 mg
  Filled 2023-04-19: qty 1

## 2023-04-19 MED ORDER — AMIODARONE HCL 200 MG PO TABS
200.0000 mg | ORAL_TABLET | Freq: Two times a day (BID) | ORAL | Status: DC
  Administered 2023-04-19 – 2023-04-21 (×5): 200 mg
  Filled 2023-04-19 (×5): qty 1

## 2023-04-19 MED ORDER — VITAMIN D 25 MCG (1000 UNIT) PO TABS
2000.0000 [IU] | ORAL_TABLET | Freq: Every day | ORAL | Status: DC
Start: 2023-04-19 — End: 2023-05-03
  Administered 2023-04-19 – 2023-05-03 (×15): 2000 [IU]
  Filled 2023-04-19 (×15): qty 2

## 2023-04-19 NOTE — Plan of Care (Signed)
  Problem: Education: Goal: Ability to describe self-care measures that may prevent or decrease complications (Diabetes Survival Skills Education) will improve Outcome: Progressing   Problem: Coping: Goal: Ability to adjust to condition or change in health will improve Outcome: Progressing   Problem: Fluid Volume: Goal: Ability to maintain a balanced intake and output will improve Outcome: Progressing   Problem: Health Behavior/Discharge Planning: Goal: Ability to identify and utilize available resources and services will improve Outcome: Progressing   Problem: Metabolic: Goal: Ability to maintain appropriate glucose levels will improve Outcome: Progressing   Problem: Nutritional: Goal: Maintenance of adequate nutrition will improve Outcome: Progressing Goal: Progress toward achieving an optimal weight will improve Outcome: Progressing   Problem: Skin Integrity: Goal: Risk for impaired skin integrity will decrease Outcome: Progressing   Problem: Education: Goal: Knowledge of General Education information will improve Description: Including pain rating scale, medication(s)/side effects and non-pharmacologic comfort measures Outcome: Progressing   Problem: Health Behavior/Discharge Planning: Goal: Ability to manage health-related needs will improve Outcome: Progressing   Problem: Clinical Measurements: Goal: Ability to maintain clinical measurements within normal limits will improve Outcome: Progressing Goal: Will remain free from infection Outcome: Progressing Goal: Diagnostic test results will improve Outcome: Progressing Goal: Respiratory complications will improve Outcome: Progressing Goal: Cardiovascular complication will be avoided Outcome: Progressing   Problem: Activity: Goal: Risk for activity intolerance will decrease Outcome: Progressing   Problem: Nutrition: Goal: Adequate nutrition will be maintained Outcome: Progressing   Problem: Coping: Goal:  Level of anxiety will decrease Outcome: Progressing   Problem: Elimination: Goal: Will not experience complications related to bowel motility Outcome: Progressing Goal: Will not experience complications related to urinary retention Outcome: Progressing   Problem: Pain Management: Goal: General experience of comfort will improve Outcome: Progressing   Problem: Safety: Goal: Ability to remain free from injury will improve Outcome: Progressing   Problem: Skin Integrity: Goal: Risk for impaired skin integrity will decrease Outcome: Progressing

## 2023-04-19 NOTE — Progress Notes (Signed)
Occupational Therapy Treatment Patient Details Name: [name]. MRN: [id number] DOB: 28-Apr-1956 Today's Date: 04/19/2023   History of present illness Pt is a 67 yo male pt admitted for hyperkalemia, AKI, UTI, and perinephric abscess. PMH includes Afib, asthma, CHF, COPD, CAD, depression, PVD, and esophageal cancer. Currently with PEG tube placed. Of note B LE BKA.   OT comments  Mr Florencio was seen for OT treatment on this date. Upon arrival to room pt in bed with gown removed, agreeable to tx. Pt states location as hospital but asks OT how the duck he cooked and left in the kitchen was. MOD A for bed mobility and requiring BUE support to maintain static sitting. Pt requires MIN A don gown at bed level - cues to initiate. Pt making limited progress toward goals, will continue to follow POC. Discharge recommendation updated to reflect increased assistance required.       If plan is discharge home, recommend the following:  A lot of help with walking and/or transfers;A lot of help with bathing/dressing/bathroom   Equipment Recommendations  Hospital bed    Recommendations for Other Services      Precautions / Restrictions Precautions Precautions: Fall Precaution Comments: BLE BKA, no prosthetics in room Restrictions Weight Bearing Restrictions Per Provider Order: No       Mobility Bed Mobility Overal bed mobility: Needs Assistance Bed Mobility: Rolling, Supine to Sit, Sit to Supine Rolling: Min assist   Supine to sit: Mod assist Sit to supine: Mod assist        Transfers Overall transfer level: Needs assistance   Transfers: Bed to chair/wheelchair/BSC            Lateral/Scoot Transfers: Mod assist General transfer comment: scooting along EOB     Balance Overall balance assessment: Needs assistance Sitting-balance support: Bilateral upper extremity supported, Feet unsupported Sitting balance-Leahy Scale: Fair                                      ADL either performed or assessed with clinical judgement   ADL Overall ADL's : Needs assistance/impaired                                       General ADL Comments: MIN A don gown at bed level - cues to initiate.        Cognition Arousal: Alert Behavior During Therapy: WFL for tasks assessed/performed Overall Cognitive Status: Impaired/Different from baseline Area of Impairment: Orientation, Following commands, Safety/judgement                 Orientation Level: Disoriented to, Time, Situation     Following Commands: Follows one step commands with increased time Safety/Judgement: Decreased awareness of safety, Decreased awareness of deficits     General Comments: states location as hospital but asks therapist how the duck he cooked and left in the kitchen was.                   Pertinent Vitals/ Pain       Pain Assessment Pain Assessment: No/denies pain   Frequency  Min 1X/week        Progress Toward Goals  OT Goals(current goals can now be found in the care plan section)  Progress towards OT goals: Goals drowngraded-see care plan  Acute Rehab OT Goals  Patient Stated Goal: to go home OT Goal Formulation: With patient Time For Goal Achievement: 04/26/23 Potential to Achieve Goals: Good ADL Goals Pt Will Perform Lower Body Dressing: with min assist;sitting/lateral leans;bed level Pt Will Transfer to Toilet: with transfer board;bedside commode;with mod assist Pt Will Perform Toileting - Clothing Manipulation and hygiene: with min assist;sitting/lateral leans Additional ADL Goal #1: Pt will tolerate grooming tasks seated EOB wiht setup and supv>96min.  Plan      Co-evaluation                 AM-PAC OT "6 Clicks" Daily Activity     Outcome Measure   Help from another person eating meals?: None Help from another person taking care of personal grooming?: A Lot Help from another person toileting, which includes using  toliet, bedpan, or urinal?: A Lot Help from another person bathing (including washing, rinsing, drying)?: A Lot Help from another person to put on and taking off regular upper body clothing?: None Help from another person to put on and taking off regular lower body clothing?: A Lot 6 Click Score: 16    End of Session    OT Visit Diagnosis: Muscle weakness (generalized) (M62.81);Other symptoms and signs involving cognitive function   Activity Tolerance Patient tolerated treatment well   Patient Left in bed;with call bell/phone within reach;with bed alarm set   Nurse Communication          Time: 8657-8469 OT Time Calculation (min): 15 min  Charges: OT General Charges $OT Visit: 1 Visit OT Treatments $Therapeutic Activity: 8-22 mins  Kathie Dike, M.S. OTR/L  04/19/23, 1:46 PM  ascom [phone]

## 2023-04-19 NOTE — Consult Note (Signed)
PHARMACY CONSULT NOTE - ELECTROLYTES  Pharmacy Consult for Electrolyte Monitoring and Replacement   Recent Labs: Height: 5\' 5"  (165.1 cm) (Bilateral BKAs) Weight: 119 kg (262 lb 5.6 oz) IBW/kg (Calculated) : 61.5 Estimated Creatinine Clearance: 91.8 mL/min (by C-G formula based on SCr of 0.92 mg/dL).  Potassium (mmol/L)  Date Value  04/19/2023 5.5 (H)  07/12/2011 4.4   Magnesium (mg/dL)  Date Value  29/52/8413 2.3   Calcium (mg/dL)  Date Value  24/40/1027 9.1   Calcium, Total (mg/dL)  Date Value  25/36/6440 8.8   Albumin (g/dL)  Date Value  34/74/2595 2.4 (L)   Phosphorus (mg/dL)  Date Value  63/87/5643 4.4   Sodium (mmol/L)  Date Value  04/19/2023 134 (L)  07/12/2011 141   Assessment  [name]. is a 67 y.o. male presenting with hyperkalemia, severe dehydration and AKI. PMH significant for atrial fibrillation, asthma, congestive heart failure, COPD, coronary artery disease, depression, diabetes mellitus, gout, hyperlipidemia, hypertension, hypogonadism in male, morbid obesity, peripheral vascular disease, OSA on CPAP at night, esophageal cancer status post chemo and G-tube placement. Pharmacy has been consulted to monitor and replace electrolytes.  Diet: Tube feeds MIVF: None, free water 200 ml TID  Goal of Therapy: K+ > 4 Mg > 2 K = 5.5 12/19 12/18 12/17  12/16  5.3 5.2 5.5 4.8  10 g lokelma 10 g lokelma 10 mg lokelma 5 g lokelma   Mg = 2.3 (12/18) Phos = 4.4 (12/18)  Plan:  Consider increasing Lokelma from 10g daily to 10 mg twice daily  No pertinent medications at this time that can cause hyperkalemia  F/u with AM labs.   Thank you for allowing pharmacy to be a part of this patient's care.  [name], PharmD Pharmacy Resident  04/19/2023 7:01 AM

## 2023-04-19 NOTE — Progress Notes (Signed)
PROGRESS NOTE    Alec Lowry Bowl.  WJX:[id number] DOB: 04-04-1956 DOA: 03/28/2023 PCP: [name], MD    Brief Narrative:   Alec Bender. is a 67 y.o. male with medical history significant of atrial fibrillation, asthma, congestive heart failure, COPD, coronary artery disease, depression, diabetes mellitus, gout, hyperlipidemia, hypertension, hypogonadism in male, morbid obesity, peripheral vascular disease, OSA on CPAP at night, esophageal cancer status post chemo and G-tube placement.  Patient currently being managed for hyperkalemia, severe dehydration and AKI and acute metabolic encephalopathy.   12/15: Lokelma ordered for potassium of 5.2 12/16: MRI brain neg, ammonia normal, started PO amiodaone 12/17: SNF search and insurance Auth started, last day for Vanco 12/18: waiting for SNF 12/19: Insurance Auth pending 12/20: restarted Banatrol fibers per family request for diarrhea, cardioversion on 12/23, K 5.5  Assessment & Plan:   Principal Problem:   Hyperkalemia Active Problems:   Delirium   Acute kidney injury (HCC)   Malnutrition of moderate degree   Acute hyperkalemia   Diarrhea   Perinephric fluid collection   Persistent atrial fibrillation (HCC)   Typical atrial flutter (HCC)   Diarrhea of infectious origin   Atrial flutter with rapid ventricular response (HCC)   Malignant neoplasm of lower third of esophagus (HCC)   Palliative care encounter  Acute kidney injury secondary to dehydration Initially, up-trending cr in setting of ongoing diarrhea -Creatinine normalized   Delirium Intermittent.  Wife concerned that UTI may be contributing.  Mental status seems okay. He does intermittent confusion MRI brain neg for acute patho. Ammonia normal - haldol prn -completed course of Rocephin for UTI.     Bacteriuria Klebsiella UTI It is possible that Klebsiella in the urine represents chronic bacteria and colonization.  However given mentation  altered from baseline it is not unreasonable to give patient a trial of intravenous antibiotics to assess for any improvement in mentation.  If there is any deterioration from a C. difficile standpoint we will need to will hold on UTI treatment. Rocephin 1 g every 24 hours per sensitivities.  5-day course.  Last dose 12/15. Wife was asking if we could restart antibiotic or check UA.  I have made clear that if she wants to do UA and if UA is clear we cannot start antibiotic.  She prefers to just empirically give him short course of antibiotics at discharge.  Acute diarrhea secondary to C. difficile infection Continues to have profuse watery BMs multiple times a day Per GI this could be related to tube feeds which can produce 6 watery bowel movements daily Patient is status post 10 days of Dificid.  Vancomycin added by GI on 12/8 Status post flexible sigmoidoscopy on 12/11.  Normal-appearing mucosa.  Biopsies taken.  Diarrhea has been improving as of 12/12 Continue p.o. vancomycin 500 mg per tube twice daily, 10 day course recommended at this time.  2 more doses of vancomycin and it can be stopped after today Diarrhea improving.  Remaining loose stools may be secondary to tube feeds.  Imodium as needed. Wife requested fiber supplement - Banatrol which seem to help - restarted now  Possible right perinephric hematoma --Described as possible abscess by radiology but per secure chat communication w/ Dr. Apolinar Junes on 12/9, this is a chornic complex hematoma dating back over a year, nothing that has been drainable thus far, possible underlying renal mass, and in light of significant comorbidities and as he is asymptomatic inpatient evaluation has been deferred, plan is outpatient urology  f/u, consider MRI then.   Hyperkalemia K 5.5. pharmacy managing electrolytes, will give Lokelma and monitor    Chronic persistent atrial flutter with rvr Cardiology now following. Remains in rvr, hemodynamically stable -  HR remains in 120s at rest and with minimal activity in 140-150s -Cardioversion without TEE can be performed after December 23 per cardio.  This could be done as an outpatient if patient has SNF bed available - continue apixaban -Continue Cardizem, metoprolol Amio infusion discontinued per cardiology in the past as ineffective amiodarone 200 mg by mouth twice daily restarted in preparation for possible cardioversion on December 23  Urinary retention Resolved   Chronic diastolic congestive heart failure --Recent EF of 55% on echo obtained on November 2024   COPD --Continue as needed nebulization    Coronary artery disease,  --Continue aspirin therapy   Diabetes mellitus type 2 with hyperglycemia -- Continue Semglee 30 units nightly (previous dose 35 units)   Hypothyroidism --on levothyroxine     Nutrition Status: Nutrition Problem: Moderate Malnutrition Etiology: chronic illness (esopahgeal mass, likely malginant) Signs/Symptoms: mild fat depletion, mild muscle depletion Interventions: Tube feeding   Peripheral vascular disease S/p bilateral BKAs --Continue aspirin  --Patient is allergic to statin therapy   OSA on CPAP at night,  --CPAP at night   Esophageal cancer status post chemo and G-tube placement. --pt gets Bolus TF --Restarted post endoscopy   DVT prophylaxis: Apixaban Code Status: Full Family Communication: None at bedside Disposition Plan: Status is: Inpatient Remains inpatient appropriate because: Multiple acute issues as above, waiting for insurance Auth for SNF   Level of care: Progressive  Consultants:  GI Oncology  Procedures:  Flexible sigmoidoscopy  Antimicrobials: Vanco completed   Subjective:  Intermittent confusion. HR in 120s - asymptomatic. He was asking fiber supplements (banatrol) for his diarrhea  Objective: Vitals:   04/19/23 0500 04/19/23 0824 04/19/23 1127 04/19/23 1612  BP:  126/76 120/82 108/71  Pulse:  (!) 117 (!)  118 (!) 117  Resp:  20 20 20   Temp:  98.6 F (37 C) 98 F (36.7 C) 97.6 F (36.4 C)  TempSrc:  Oral Oral Oral  SpO2:  94% 95% 96%  Weight: 119 kg     Height:        Intake/Output Summary (Last 24 hours) at 04/19/2023 1940 Last data filed at 04/19/2023 1730 Gross per 24 hour  Intake --  Output 1900 ml  Net -1900 ml    Filed Weights   04/18/23 0500 04/18/23 0800 04/19/23 0500  Weight: 120.9 kg 119.1 kg 119 kg    Examination:  General exam: No acute distress.  Appears fatigued and chronically ill Respiratory system: Decreased breath sounds at right base.  Normal work of breathing.  Room air Cardiovascular system: S1-S2, tachycardic, irregular rhythm, no murmurs Gastrointestinal system: Soft, nondistended, hyperactive bowel sounds, + PEG Central nervous system: Alert.  Oriented x 2.  No focal deficits Extremities: Bilateral BKA Skin: No rashes, lesions or ulcers Psychiatry: Judgement and insight appear normal. Mood & affect appropriate.     Data Reviewed: I have personally reviewed following labs and imaging studies  CBC: Recent Labs  Lab 04/13/23 0415 04/15/23 0629 04/16/23 0504 04/17/23 0454 04/19/23 0531  WBC 11.3* 10.2 10.6* 9.8 12.4*  HGB 11.6* 11.7* 12.6* 12.5* 12.0*  HCT 35.6* 36.8* 39.5 39.5 36.9*  MCV 89.9 92.0 90.6 91.6 90.7  PLT 303 253 251 282 287   Basic Metabolic Panel: Recent Labs  Lab 04/14/23 0447 04/15/23 0629 04/16/23 0504  04/17/23 0712 04/18/23 0829 04/19/23 0531  NA 135 135 135 135  --  134*  K 5.2* 4.8 5.5* 5.2* 5.3* 5.5*  CL 104 104 104 103  --  99  CO2 24 22 22 24   --  25  GLUCOSE 196* 181* 193* 241*  --  254*  BUN 32* 33* 31* 38*  --  33*  CREATININE 1.05 1.00 1.05 1.11  --  0.92  CALCIUM 8.8* 8.8* 8.9 8.9  --  9.1  MG 2.3 2.3 2.4 2.3  --   --   PHOS 4.4 4.0 4.3 4.4  --   --     CBG: Recent Labs  Lab 04/18/23 2118 04/19/23 0819 04/19/23 1124 04/19/23 1605 04/19/23 1803  GLUCAP 242* 247* 238* 269* 236*    Sepsis  Labs: No results for input(s): "PROCALCITON", "LATICACIDVEN" in the last 168 hours.   No results found for this or any previous visit (from the past 240 hours).        Radiology Studies: No results found.       Scheduled Meds:  amiodarone  200 mg Per Tube BID   apixaban  5 mg Per Tube BID   ascorbic acid  500 mg Per Tube Daily   aspirin  81 mg Per Tube Daily   cholecalciferol  2,000 Units Per Tube Daily   clonazePAM  0.5 mg Per Tube BID   clonazePAM  0.5 mg Per Tube Once   cyanocobalamin  10,000 mcg Per Tube Daily   diltiazem  60 mg Per Tube Q6H   ezetimibe  10 mg Per Tube Daily   feeding supplement (GLUCERNA 1.5 CAL)  237 mL Per Tube 6 X Daily   ferrous sulfate  300 mg Per Tube Daily   fiber supplement (BANATROL TF)  60 mL Per Tube TID   free water  140 mL Per Tube 6 X Daily   furosemide  40 mg Per Tube Daily   insulin aspart  0-5 Units Subcutaneous QHS   insulin aspart  0-9 Units Subcutaneous TID WC   insulin glargine-yfgn  30 Units Subcutaneous QHS   [START ON 04/20/2023] levothyroxine  25 mcg Per Tube Q0600   metoprolol tartrate  25 mg Per Tube Q6H   multivitamin with minerals  1 tablet Per Tube Daily   pramipexole  2 mg Per Tube QHS   primidone  250 mg Per Tube BID   sodium zirconium cyclosilicate  10 g Per Tube Daily   Continuous Infusions:     LOS: 22 days   Time spent 35 minutes   Delfino Lovett, MD [facility]   If 7PM-7AM, please contact night-coverage  04/19/2023, 7:40 PM

## 2023-04-19 NOTE — TOC Progression Note (Signed)
Transition of Care (TOC) - Progression Note    Patient Details  Name: [name]. MRN: [id number] Date of Birth: 1955/05/02  Transition of Care [facility]) CM/SW Contact  Truddie Hidden, RN Phone Number: 04/19/2023, 1:41 PM  Clinical Narrative:    4:50pm Retrieved message from Tammy in admissions at [facility] that patient has been denied SNF based on utilization of all 100  SNF days as of 02/01/2023. Patient's spouse can file appeal by calling [phone] or faxing [phone].   6:30pm Spoke with patient's wife, [name]. She was advised SNF was denied and provided with phone number to call. She refused faxed number. [name] insists that patient has not used 100 days of inpatient therapy this year. She stated patient has been attending outpatient therapy. "That shouldn't count." She plans to file appeal tomorrow morning.               Expected Discharge Plan and Services                                               Social Determinants of Health (SDOH) Interventions SDOH Screenings   Food Insecurity: No Food Insecurity (03/29/2023)  Housing: Low Risk  (03/29/2023)  Transportation Needs: No Transportation Needs (03/29/2023)  Utilities: Not At Risk (03/29/2023)  Depression (PHQ2-9): Low Risk  (11/21/2021)  Financial Resource Strain: Low Risk  (09/28/2022)   Received from [facility], [facility]  Tobacco Use: Medium Risk (04/10/2023)    Readmission Risk Interventions    02/13/2022    4:57 PM 09/29/2021   11:12 AM  Readmission Risk Prevention Plan  Transportation Screening Complete Complete  PCP or Specialist Appt within 3-5 Days  Complete  HRI or Home Care Consult  Complete  Social Work Consult for Recovery Care Planning/Counseling  Complete  Palliative Care Screening  Not Applicable  Medication Review Oceanographer) Complete Complete  PCP or Specialist appointment within 3-5 days of discharge  Complete   HRI or Home Care Consult Complete   SW Recovery Care/Counseling Consult Complete   Palliative Care Screening Not Applicable   Skilled Nursing Facility Not Applicable

## 2023-04-19 NOTE — TOC Progression Note (Addendum)
Transition of Care (TOC) - Progression Note    Patient Details  Name: [name]. MRN: [id number] Date of Birth: 10-27-1955  Transition of Care [facility]) CM/SW Contact  Truddie Hidden, RN Phone Number: 04/19/2023, 2:04 PM  Clinical Narrative:    10:12am Retrieved call from patient stated the number provided to call for a fast appeal rings like a fax  10:20am Contacted Gena at facility. She provided phone number [phone]. RNCM provided number to [facility].   12:30pm Retrieved message from [place]. She stated the number provided was not successful. She was not able to reach a person. RNCM contacted Gena at [facility]. Gena advised to contact the number on back of patient's [facility] insurance card. [name] stated the number provided is the number she dialed.   1:00pm RNCM provided phone number for [facility] PPO Fast appeals per internet search @ [phone]. [name] insist that patient has not exhausted his SNF days. She has been advised final determination lies with the payor.   3:30pm Spoke with [name]. She stated she had spoke with Emoni at [facility]. No [facility] was found on file. She also said per [facility] patient has SNF days availablel. She provided a number for [employer] @ [phone] or fax [phone] for facility to submit information for auth. Spoke with Gena at facility. Gena stated she would run the available days to see the remainder patient has left. Gena provided with number for [employer].         Expected Discharge Plan and Services                                               Social Determinants of Health (SDOH) Interventions SDOH Screenings   Food Insecurity: No Food Insecurity (03/29/2023)  Housing: Low Risk  (03/29/2023)  Transportation Needs: No Transportation Needs (03/29/2023)  Utilities: Not At Risk (03/29/2023)  Depression (PHQ2-9): Low Risk  (11/21/2021)  Financial Resource Strain: Low Risk  (09/28/2022)   Received from [facility], [facility]  Tobacco Use: Medium Risk (04/10/2023)    Readmission Risk Interventions    02/13/2022    4:57 PM 09/29/2021   11:12 AM  Readmission Risk Prevention Plan  Transportation Screening Complete Complete  PCP or Specialist Appt within 3-5 Days  Complete  HRI or Home Care Consult  Complete  Social Work Consult for Recovery Care Planning/Counseling  Complete  Palliative Care Screening  Not Applicable  Medication Review Oceanographer) Complete Complete  PCP or Specialist appointment within 3-5 days of discharge Complete   HRI or Home Care Consult Complete   SW Recovery Care/Counseling Consult Complete   Palliative Care Screening Not Applicable   Skilled Nursing Facility Not Applicable

## 2023-04-19 NOTE — Progress Notes (Signed)
Progress Note  Patient Name: [name]. Date of Encounter: 04/19/2023  Primary Cardiologist: Kirke Corin  Subjective   No chest pain, dyspnea, palpitations, dizziness, presyncope, or syncope.  Remains in atrial flutter with RVR.  Has not missed any doses of apixaban.  Baseline intermittent confusion.  Inpatient Medications    Scheduled Meds:  amiodarone  200 mg Per Tube BID   apixaban  5 mg Per Tube BID   ascorbic acid  500 mg Oral Daily   aspirin  81 mg Per Tube Daily   cholecalciferol  2,000 Units Per Tube Daily   clonazePAM  1 mg Per Tube BID   cyanocobalamin  10,000 mcg Per Tube Daily   diltiazem  60 mg Per Tube Q6H   ezetimibe  10 mg Per Tube Daily   feeding supplement (GLUCERNA 1.5 CAL)  237 mL Per Tube 6 X Daily   ferrous sulfate  300 mg Per Tube Daily   fiber supplement (BANATROL TF)  60 mL Per Tube TID   free water  140 mL Per Tube 6 X Daily   insulin aspart  0-5 Units Subcutaneous QHS   insulin aspart  0-9 Units Subcutaneous TID WC   insulin glargine-yfgn  30 Units Subcutaneous QHS   [START ON 04/20/2023] levothyroxine  25 mcg Per Tube Q0600   metoprolol tartrate  25 mg Per Tube Q6H   multivitamin with minerals  1 tablet Per Tube Daily   pramipexole  2 mg Per Tube QHS   primidone  250 mg Per Tube BID   sodium zirconium cyclosilicate  10 g Per Tube Daily   Continuous Infusions:  PRN Meds: acetaminophen, haloperidol, ipratropium-albuterol, loperamide HCl, nitroGLYCERIN, ondansetron, mouth rinse   Vital Signs    Vitals:   04/18/23 2315 04/19/23 0415 04/19/23 0500 04/19/23 0824  BP: 113/77 109/68  126/76  Pulse:    (!) 117  Resp: (!) 25 (!) 22  20  Temp: 98.4 F (36.9 C) 97.9 F (36.6 C)  98.6 F (37 C)  TempSrc:  Oral  Oral  SpO2: 98% 97%  94%  Weight:   119 kg   Height:        Intake/Output Summary (Last 24 hours) at 04/19/2023 0952 Last data filed at 04/19/2023 0538 Gross per 24 hour  Intake 1348 ml  Output 1200 ml  Net 148 ml    Filed Weights   04/18/23 0500 04/18/23 0800 04/19/23 0500  Weight: 120.9 kg 119.1 kg 119 kg    Telemetry    Atrial flutter with RVR with ventricular rates in the 1 teens to 120s bpm - Personally Reviewed  ECG    No new tracings - Personally Reviewed  Physical Exam   GEN: No acute distress.   Neck: No JVD. Cardiac: Tachycardic, no murmurs, rubs, or gallops.  Respiratory: Clear to auscultation bilaterally.  GI: Soft, nontender, non-distended.   MS: Status post bilateral BKA. Neuro:  Alert and oriented x 3; Nonfocal.  Psych: Normal affect.  Labs    Chemistry Recent Labs  Lab 04/16/23 0504 04/17/23 0712 04/18/23 0829 04/19/23 0531  NA 135 135  --  134*  K 5.5* 5.2* 5.3* 5.5*  CL 104 103  --  99  CO2 22 24  --  25  GLUCOSE 193* 241*  --  254*  BUN 31* 38*  --  33*  CREATININE 1.05 1.11  --  0.92  CALCIUM 8.9 8.9  --  9.1  PROT  --  6.6  --   --  ALBUMIN  --  2.4*  --   --   AST  --  17  --   --   ALT  --  34  --   --   ALKPHOS  --  129*  --   --   BILITOT  --  <0.2  --   --   GFRNONAA >60 >60  --  >60  ANIONGAP 9 8  --  10     Hematology Recent Labs  Lab 04/16/23 0504 04/17/23 0454 04/19/23 0531  WBC 10.6* 9.8 12.4*  RBC 4.36 4.31 4.07*  HGB 12.6* 12.5* 12.0*  HCT 39.5 39.5 36.9*  MCV 90.6 91.6 90.7  MCH 28.9 29.0 29.5  MCHC 31.9 31.6 32.5  RDW 14.4 14.4 14.3  PLT 251 282 287    Cardiac EnzymesNo results for input(s): "TROPONINI" in the last 168 hours. No results for input(s): "TROPIPOC" in the last 168 hours.   BNPNo results for input(s): "BNP", "PROBNP" in the last 168 hours.   DDimer No results for input(s): "DDIMER" in the last 168 hours.   Radiology    No results found.  Cardiac Studies   2D echo 03/17/2023: 1. Left ventricular ejection fraction, by estimation, is 55 %. The left  ventricle has normal function. The left ventricle demonstrates regional  wall motion abnormalities (mild hypokinesis of the basal inferior wall).   There is mild left ventricular  hypertrophy. Left ventricular diastolic parameters are consistent with  Grade I diastolic dysfunction (impaired relaxation). The average left  ventricular global longitudinal strain is -12.0 %.   2. Right ventricular systolic function is normal. The right ventricular  size is normal. Tricuspid regurgitation signal is inadequate for assessing  PA pressure.   3. The mitral valve is normal in structure. No evidence of mitral valve  regurgitation. No evidence of mitral stenosis.   4. The aortic valve is tricuspid. There is mild calcification of the  aortic valve. Aortic valve regurgitation is not visualized. Aortic valve  sclerosis/calcification is present, without any evidence of aortic  stenosis.   5. The inferior vena cava is normal in size with greater than 50%  respiratory variability, suggesting right atrial pressure of 3 mmHg.   Patient Profile     67 y.o. male with history of CAD status post CABG in 2006, A-fib status post prior DCCV, esophageal cancer status postchemotherapy and G-tube placement, PAD status post bilateral BKA, DM2, HTN, and obesity who we are seeing for atrial flutter with RVR.  Assessment & Plan    Atrial flutter with RVR - History of paroxysmal Afib with multiple cardioversions - CHA2DS2-VASc of at least 5  - Continue apixaban 5 mg BID - Remains in atrial flutter with continued elevated rates 110-120 -IV digoxin has not been effective   Continue short acting diltiazem and metoprolol tartrate given G-tube.  Not able to uptitrate due to soft blood pressure -Continue amiodarone 200 mg twice daily through 12/30 with 200 mg daily thereafter - Not a candidate for TEE given esophageal cancer - Cardioversion without TEE can be performed after December 23 as long as request does not get interrupted any further - N.p.o. at midnight heading into 12/23 with plans for DCCV on 12/23 - Amiodarone drip was not effective in rate control.   However, I elected to start amiodarone 200 mg by mouth twice daily in preparation for cardioversion in the near future as it will help keep him in sinus rhythm, - With intermittent confusion, revisit cardioversion discussion on  12/22   Acute metabolic encephalopathy - Esophageal cancer s/p G tube - C diff with profuse diarrhea - AMS with agitation, requires safety observations - Management per IM -MRI of the brain without acute process   CAD s/p CABG/PAD s/p BKA - Denies chest pain - Continue ASA and ezetimibe - Statin intolerance  Hyperkalemia - Repeat pending - Per primary service   Normocytic anemia - Hgb stable   Informed Consent   Shared Decision Making/Informed Consent{  The risks (stroke, cardiac arrhythmias rarely resulting in the need for a temporary or permanent pacemaker, skin irritation or burns and complications associated with conscious sedation including aspiration, arrhythmia, respiratory failure and death), benefits (restoration of normal sinus rhythm) and alternatives of a direct current cardioversion were explained in detail to Mr. Maria and he agrees to proceed.        For questions or updates, please contact [facility] Please consult [email or web address] for contact info under Cardiology/STEMI.    Signed, Eula Listen, PA-C [facility] Pager: [phone] 04/19/2023, 9:52 AM

## 2023-04-19 NOTE — Progress Notes (Signed)
PT Cancellation Note  Patient Details Name: [name]. MRN: [id number] DOB: 11-Mar-1956   Cancelled Treatment:     PT attempt, Pt sound asleep. Easily awakes but is unable to stay awake to safely participate. Acute PT will continue to follow per current POC.    Rushie Chestnut 04/19/2023, 2:17 PM

## 2023-04-19 NOTE — Plan of Care (Signed)
  Problem: Education: Goal: Ability to describe self-care measures that may prevent or decrease complications (Diabetes Survival Skills Education) will improve Outcome: Progressing   Problem: Coping: Goal: Ability to adjust to condition or change in health will improve Outcome: Progressing   Problem: Fluid Volume: Goal: Ability to maintain a balanced intake and output will improve Outcome: Progressing   

## 2023-04-19 NOTE — Plan of Care (Signed)
  Problem: Clinical Measurements: Goal: Ability to maintain clinical measurements within normal limits will improve Outcome: Progressing   Problem: Safety: Goal: Ability to remain free from injury will improve Outcome: Progressing   Problem: Skin Integrity: Goal: Risk for impaired skin integrity will decrease Outcome: Progressing   Problem: Pain Management: Goal: General experience of comfort will improve Outcome: Progressing

## 2023-04-19 NOTE — Progress Notes (Signed)
Nutrition Follow-up  DOCUMENTATION CODES:   Obesity unspecified, Non-severe (moderate) malnutrition in context of chronic illness  INTERVENTION:   -TF via g-tube:    237 ml Glucerna 1.5 6 times daily  70 ml free water flush before each feeding and 70 ml free water flush after each after each feeding administration   Tube feeding regimen provides 2136 kcal (100% of needs), 118 grams of protein, and 1080 ml of H2O.  Total free water: 1920 ml daily   -Allow ice chips for dry mouth per discussion with MD -1 packet Banatrol TID   NUTRITION DIAGNOSIS:   Moderate Malnutrition related to chronic illness (esopahgeal mass, likely malginant) as evidenced by mild fat depletion, mild muscle depletion.  Ongoing  GOAL:   Patient will meet greater than or equal to 90% of their needs  Met with TF  MONITOR:   TF tolerance  REASON FOR ASSESSMENT:   Consult Enteral/tube feeding initiation and management  ASSESSMENT:   Pt with medical history significant of atrial fibrillation, asthma, congestive heart failure, COPD, coronary artery disease, depression, diabetes mellitus, gout, hyperlipidemia, hypertension, hypogonadism in male, morbid obesity, peripheral vascular disease, OSA on CPAP at night, esophageal cancer status post chemo and G-tube placement.  11/15- s/p EGD- food removal in lower third of esophagus, malignant appearing esophageal stenosis, completely obstructing, esophageal tumor in lowe third of esophagus (biopsied)  11/18- s/p Stamm gastrostomy  11/30- rectal tube placed 12/5- banatrol added for diarrhea 12/6- rectal tube removed by pt 12/11- s/p flex sig- revealed poor prep, stool in rectum and recto-sigmoid colon, normal mucosa in sigmoid colon (biopsied) 12/13- s/p SLP- sign off, remain NPO  Reviewed I/O's: +504 ml x 24 hours and +12.1 since 04/05/23  UOP: 1.2 L x 24 hours   Pt sitting up in bed at time of visit. No family at beside. He reports he is feeling well  other than "being stuck in [place]". Pt states "I'm ready to get out of here. Everyone has left me". RD reoriented pt to place and situation; assured pt that we were here to assist him. Pt denied further complaints today.   Pt unsure if he has had any changes in satiety with TF change. Pt remains NPO and received TF via PEG for sole nutrition support. Pt continues to tolerate TF well.   Case discussed with MD and RN; banatrol reinstated yesterday per wife request secondary to worsening diarrhea. Per RN, pt had to be changed 4 times yesterday secondary to diarrhea.   Noted weight increase since last visit.   Per TOC notes, plan for SNF placement ([facility]) once medically stable.   Medications reviewed and include vitamin C, klonopin, vitamin B-12, ferrous sulfate, lasix, lokelma, and cardizem.   Labs reviewed: Na: 134, K: 5.5, CBGS: 184-257 (inpatient orders for glycemic control are 0-9 units insulin apaort TID with meals and 30 units insulin glargine-yfgn daily).    Diet Order:   Diet Order     None       EDUCATION NEEDS:   Education needs have been addressed  Skin:  Skin Assessment: Reviewed RN Assessment  Last BM:  04/18/23 (type 7)  Height:   Ht Readings from Last 1 Encounters:  04/02/23 5\' 5"  (1.651 m)    Weight:   Wt Readings from Last 1 Encounters:  04/19/23 119 kg    Ideal Body Weight:  75.2 kg (adjusted for bilateral BKAs)  BMI:  Body mass index is 43.66 kg/m.  Estimated Nutritional Needs:   Kcal:  9147-8295  Protein:  115-130 grams  Fluid:  > 2 L    Levada Schilling, RD, LDN, CDCES Registered Dietitian III Certified Diabetes Care and Education Specialist If unable to reach this RD, please use "RD Inpatient" group chat on secure chat between hours of 8am-4 pm daily

## 2023-04-20 DIAGNOSIS — I2510 Atherosclerotic heart disease of native coronary artery without angina pectoris: Secondary | ICD-10-CM

## 2023-04-20 DIAGNOSIS — I10 Essential (primary) hypertension: Secondary | ICD-10-CM

## 2023-04-20 DIAGNOSIS — E875 Hyperkalemia: Secondary | ICD-10-CM | POA: Diagnosis not present

## 2023-04-20 DIAGNOSIS — I9589 Other hypotension: Secondary | ICD-10-CM | POA: Diagnosis not present

## 2023-04-20 DIAGNOSIS — R410 Disorientation, unspecified: Secondary | ICD-10-CM | POA: Diagnosis not present

## 2023-04-20 DIAGNOSIS — I4892 Unspecified atrial flutter: Secondary | ICD-10-CM | POA: Diagnosis not present

## 2023-04-20 DIAGNOSIS — C155 Malignant neoplasm of lower third of esophagus: Secondary | ICD-10-CM | POA: Diagnosis not present

## 2023-04-20 DIAGNOSIS — I2583 Coronary atherosclerosis due to lipid rich plaque: Secondary | ICD-10-CM

## 2023-04-20 LAB — CBC
HCT: 37.2 % — ABNORMAL LOW (ref 39.0–52.0)
Hemoglobin: 11.7 g/dL — ABNORMAL LOW (ref 13.0–17.0)
MCH: 29 pg (ref 26.0–34.0)
MCHC: 31.5 g/dL (ref 30.0–36.0)
MCV: 92.3 fL (ref 80.0–100.0)
Platelets: 294 10*3/uL (ref 150–400)
RBC: 4.03 MIL/uL — ABNORMAL LOW (ref 4.22–5.81)
RDW: 14.4 % (ref 11.5–15.5)
WBC: 10.8 10*3/uL — ABNORMAL HIGH (ref 4.0–10.5)
nRBC: 0 % (ref 0.0–0.2)

## 2023-04-20 LAB — BASIC METABOLIC PANEL
Anion gap: 8 (ref 5–15)
BUN: 31 mg/dL — ABNORMAL HIGH (ref 8–23)
CO2: 28 mmol/L (ref 22–32)
Calcium: 9.4 mg/dL (ref 8.9–10.3)
Chloride: 98 mmol/L (ref 98–111)
Creatinine, Ser: 0.92 mg/dL (ref 0.61–1.24)
GFR, Estimated: >60 mL/min (ref >60)
Glucose, Bld: 148 mg/dL — ABNORMAL HIGH (ref 70–99)
Potassium: 5.2 mmol/L — ABNORMAL HIGH (ref 3.5–5.1)
Sodium: 134 mmol/L — ABNORMAL LOW (ref 135–145)

## 2023-04-20 LAB — GLUCOSE, CAPILLARY
Glucose-Capillary: 154 mg/dL — ABNORMAL HIGH (ref 70–99)
Glucose-Capillary: 214 mg/dL — ABNORMAL HIGH (ref 70–99)
Glucose-Capillary: 249 mg/dL — ABNORMAL HIGH (ref 70–99)
Glucose-Capillary: 241 mg/dL — ABNORMAL HIGH (ref 70–99)

## 2023-04-20 MED ORDER — SODIUM CHLORIDE 0.9 % IV BOLUS: 500.0000 mL | Freq: Once | INTRAVENOUS | Status: DC

## 2023-04-20 NOTE — Progress Notes (Signed)
Spoke with the patient's wife, [name], on the phone this morning for discussion of DCCV.  The risks (stroke, cardiac arrhythmias rarely resulting in the need for a temporary or permanent pacemaker, skin irritation or burns and complications associated with conscious sedation including aspiration, arrhythmia, respiratory failure and death), benefits (restoration of normal sinus rhythm) and alternatives of a direct current cardioversion were explained in detail to [name] and his wife, they agree to proceed.   Two-person consent was obtained via the phone between myself and Heywood Iles, Charity fundraiser. Patient's wife agrees to move forward with DCCV. She does not have any questions at this time.

## 2023-04-20 NOTE — Progress Notes (Signed)
Progress Note  Patient Name: [name]. Date of Encounter: 04/20/2023  Primary Cardiologist: Kirke Corin  Subjective   No chest pain, dyspnea, palpitations, dizziness, presyncope, or syncope.  Remains in atrial flutter with RVR.  Has not missed any doses of apixaban. Much more lucid today. Metoprolol held overnight with BP in the 80s systolic. BP in the upper 90s systolic this morning. Asymptomatic.   Inpatient Medications    Scheduled Meds:  amiodarone  200 mg Per Tube BID   apixaban  5 mg Per Tube BID   ascorbic acid  500 mg Per Tube Daily   aspirin  81 mg Per Tube Daily   cholecalciferol  2,000 Units Per Tube Daily   clonazePAM  0.5 mg Per Tube BID   clonazePAM  0.5 mg Per Tube Once   cyanocobalamin  10,000 mcg Per Tube Daily   diltiazem  60 mg Per Tube Q6H   ezetimibe  10 mg Per Tube Daily   feeding supplement (GLUCERNA 1.5 CAL)  237 mL Per Tube 6 X Daily   ferrous sulfate  300 mg Per Tube Daily   fiber supplement (BANATROL TF)  60 mL Per Tube TID   free water  140 mL Per Tube 6 X Daily   furosemide  40 mg Per Tube Daily   insulin aspart  0-5 Units Subcutaneous QHS   insulin aspart  0-9 Units Subcutaneous TID WC   insulin glargine-yfgn  30 Units Subcutaneous QHS   levothyroxine  25 mcg Per Tube Q0600   metoprolol tartrate  25 mg Per Tube Q6H   multivitamin with minerals  1 tablet Per Tube Daily   pramipexole  2 mg Per Tube QHS   primidone  250 mg Per Tube BID   sodium zirconium cyclosilicate  10 g Per Tube Daily   Continuous Infusions:  PRN Meds: acetaminophen, haloperidol, ipratropium-albuterol, loperamide HCl, nitroGLYCERIN, ondansetron, mouth rinse   Vital Signs    Vitals:   04/19/23 2300 04/20/23 0020 04/20/23 0500 04/20/23 0615  BP:  (!) 88/63  92/65  Pulse: (!) 117 (!) 116  (!) 116  Resp: (!) 23 (!) 25  20  Temp:  98 F (36.7 C)  97.7 F (36.5 C)  TempSrc:  Axillary  Oral  SpO2: 90% 94%  95%  Weight:   119.5 kg   Height:         Intake/Output Summary (Last 24 hours) at 04/20/2023 0740 Last data filed at 04/20/2023 1191 Gross per 24 hour  Intake --  Output 1700 ml  Net -1700 ml   Filed Weights   04/18/23 0800 04/19/23 0500 04/20/23 0500  Weight: 119.1 kg 119 kg 119.5 kg    Telemetry    Atrial flutter with RVR with ventricular rates in the 1-teens bpm - Personally Reviewed  ECG    No new tracings - Personally Reviewed  Physical Exam   GEN: No acute distress.   Neck: No JVD. Cardiac: Tachycardic, no murmurs, rubs, or gallops.  Respiratory: Clear to auscultation bilaterally.  GI: Soft, nontender, non-distended.   MS: Status post bilateral BKA. Neuro:  Alert and oriented x 3; Nonfocal.  Psych: Normal affect.  Labs    Chemistry Recent Labs  Lab 04/17/23 [phone] 04/18/23 0829 04/19/23 0531 04/20/23 0356  NA 135  --  134* 134*  K 5.2* 5.3* 5.5* 5.2*  CL 103  --  99 98  CO2 24  --  25 28  GLUCOSE 241*  --  254* 148*  BUN 38*  --  33* 31*  CREATININE 1.11  --  0.92 0.92  CALCIUM 8.9  --  9.1 9.4  PROT 6.6  --   --   --   ALBUMIN 2.4*  --   --   --   AST 17  --   --   --   ALT 34  --   --   --   ALKPHOS 129*  --   --   --   BILITOT <0.2  --   --   --   GFRNONAA >60  --  >60 >60  ANIONGAP 8  --  10 8     Hematology Recent Labs  Lab 04/17/23 0454 04/19/23 0531 04/20/23 0356  WBC 9.8 12.4* 10.8*  RBC 4.31 4.07* 4.03*  HGB 12.5* 12.0* 11.7*  HCT 39.5 36.9* 37.2*  MCV 91.6 90.7 92.3  MCH 29.0 29.5 29.0  MCHC 31.6 32.5 31.5  RDW 14.4 14.3 14.4  PLT 282 287 294    Cardiac EnzymesNo results for input(s): "TROPONINI" in the last 168 hours. No results for input(s): "TROPIPOC" in the last 168 hours.   BNPNo results for input(s): "BNP", "PROBNP" in the last 168 hours.   DDimer No results for input(s): "DDIMER" in the last 168 hours.   Radiology    No results found.  Cardiac Studies   2D echo 03/17/2023: 1. Left ventricular ejection fraction, by estimation, is 55 %. The left   ventricle has normal function. The left ventricle demonstrates regional  wall motion abnormalities (mild hypokinesis of the basal inferior wall).  There is mild left ventricular  hypertrophy. Left ventricular diastolic parameters are consistent with  Grade I diastolic dysfunction (impaired relaxation). The average left  ventricular global longitudinal strain is -12.0 %.   2. Right ventricular systolic function is normal. The right ventricular  size is normal. Tricuspid regurgitation signal is inadequate for assessing  PA pressure.   3. The mitral valve is normal in structure. No evidence of mitral valve  regurgitation. No evidence of mitral stenosis.   4. The aortic valve is tricuspid. There is mild calcification of the  aortic valve. Aortic valve regurgitation is not visualized. Aortic valve  sclerosis/calcification is present, without any evidence of aortic  stenosis.   5. The inferior vena cava is normal in size with greater than 50%  respiratory variability, suggesting right atrial pressure of 3 mmHg.   Patient Profile     67 y.o. male with history of CAD status post CABG in 2006, A-fib status post prior DCCV, esophageal cancer status postchemotherapy and G-tube placement, PAD status post bilateral BKA, DM2, HTN, and obesity who we are seeing for atrial flutter with RVR.  Assessment & Plan    Atrial flutter with RVR - History of paroxysmal Afib with multiple cardioversions - CHA2DS2-VASc of at least 5  - Continue apixaban 5 mg BID - Remains in atrial flutter with continued elevated rates 110-120 - IV digoxin has not been effective   - Continue short acting diltiazem and metoprolol tartrate given G-tube, as BP allows.  Not able to uptitrate due to soft blood pressure -Continue amiodarone 200 mg twice daily through 12/30 with 200 mg daily thereafter - Not a candidate for TEE given esophageal cancer - Cardioversion without TEE can be performed on December 23 as long as request  does not get interrupted any further - Patient is alert and oriented x 3 and lucid - N.p.o. at midnight heading into 12/23 with plans  for DCCV on 12/23 - Amiodarone drip was not effective in rate control   Acute metabolic encephalopathy - Improved  - Esophageal cancer s/p G tube - C diff with profuse diarrhea - AMS with agitation, requires safety observations - Management per IM - MRI of the brain without acute process   CAD s/p CABG/PAD s/p BKA - Denies chest pain - Continue ASA and ezetimibe - Statin intolerance  Hyperkalemia - Improving - Per primary service   Normocytic anemia - Hgb stable   Informed Consent   Shared Decision Making/Informed Consent{  The risks (stroke, cardiac arrhythmias rarely resulting in the need for a temporary or permanent pacemaker, skin irritation or burns and complications associated with conscious sedation including aspiration, arrhythmia, respiratory failure and death), benefits (restoration of normal sinus rhythm) and alternatives of a direct current cardioversion were explained in detail to Mr. Prayer and he agrees to proceed.        For questions or updates, please contact [facility] Please consult [email or web address] for contact info under Cardiology/STEMI.    Signed, Eula Listen, PA-C [facility] Pager: [phone] 04/20/2023, 7:40 AM

## 2023-04-20 NOTE — Progress Notes (Signed)
MEWS Progress Note  Patient Details Name: [name]. MRN: [id number] DOB: [birth year] Today's Date: 04/20/2023   MEWS Flowsheet Documentation:  Assess: MEWS Score Temp: 97.9 F (36.6 C) BP: 96/74 MAP (mmHg): 82 Pulse Rate: (!) 112 ECG Heart Rate: (!) 112 Resp: (!) 29 Level of Consciousness: Alert SpO2: 95 % O2 Device: Room Air Patient Activity (if Appropriate): In bed O2 Flow Rate (L/min): 2 L/min Assess: MEWS Score MEWS Temp: 0 MEWS Systolic: 1 MEWS Pulse: 2 MEWS RR: 2 MEWS LOC: 0 MEWS Score: 5 MEWS Score Color: Red Assess: SIRS CRITERIA SIRS Temperature : 0 SIRS Respirations : 1 SIRS Pulse: 1 SIRS WBC: 0 SIRS Score Sum : 2 SIRS Temperature : 0 SIRS Pulse: 1 SIRS Respirations : 1 SIRS WBC: 0 SIRS Score Sum : 2    Notified CN and MD via epic chat. Cardology in Room at time. States that HR will not change. Increased vital signs per protocol. CBG 154 at this time.     Adair Laundry 04/20/2023, 7:58 AM

## 2023-04-20 NOTE — Progress Notes (Addendum)
Patient has had 3 stools today, directly coinciding with admin of Glucerna TF. Pt was lucid for me this evening and refused his tube feed. Educated on need for nutrition and patient stated "not if I'm gonna keep shitting like this". Notified Dr Chipper Herb via epic chat re: refusal of TF and the lack of banatrol in the hospital currently. Requested RN to give immodium. PRN dose given Per MAR.  Pt not urinating. Bladder scan shows . VO given to scan bladder in 4 hours.

## 2023-04-20 NOTE — Progress Notes (Signed)
Pt initially alert and oriented to self and situation this AM while speaking with MD and this RN.   Currently patient is alert to self only and stated to this RN that "you guys are stealing my presents for my wife". Attempted to reorient patient which made the patient irate and stated that he was "leaving out by that train station out the window". Attempted to reorient patient again who stated he was going to call his wife and get her to come get him. Provided patient with the phone and patient stated he knew the number. Requested haldol from pharmacy due to pt's ongoing agitation and delirium.

## 2023-04-20 NOTE — Consult Note (Signed)
PHARMACY CONSULT NOTE - ELECTROLYTES  Pharmacy Consult for Electrolyte Monitoring and Replacement   Recent Labs: Height: 5\' 5"  (165.1 cm) (Bilateral BKAs) Weight: 119.5 kg (263 lb 7.2 oz) IBW/kg (Calculated) : 61.5 Estimated Creatinine Clearance: 92.1 mL/min (by C-G formula based on SCr of 0.92 mg/dL).  Potassium (mmol/L)  Date Value  04/20/2023 5.2 (H)  07/12/2011 4.4   Magnesium (mg/dL)  Date Value  73/22/0254 2.3   Calcium (mg/dL)  Date Value  27/09/2374 9.4   Calcium, Total (mg/dL)  Date Value  28/31/5176 8.8   Albumin (g/dL)  Date Value  16/10/3708 2.4 (L)   Phosphorus (mg/dL)  Date Value  62/69/4854 4.4   Sodium (mmol/L)  Date Value  04/20/2023 134 (L)  07/12/2011 141   Assessment  Tayt A Spieker Montez Hageman. is a 67 y.o. male presenting with hyperkalemia, severe dehydration and AKI. PMH significant for atrial fibrillation, asthma, congestive heart failure, COPD, coronary artery disease, depression, diabetes mellitus, gout, hyperlipidemia, hypertension, hypogonadism in male, morbid obesity, peripheral vascular disease, OSA on CPAP at night, esophageal cancer status post chemo and G-tube placement. Pharmacy has been consulted to monitor and replace electrolytes.  Diet: Tube feeds MIVF: Free water 140 mL per tube 6x/day  Goal of Therapy: K+ > 4 Mg > 2  Plan:  Continue Lokelma 10 g daily. Lasix was discontinued per provider. Not sure what potassium content is of tube feeds. May consider low potassium formulation Will continue to follow along  Thank you for allowing pharmacy to be a part of this patient's care.  Tressie Ellis 04/20/2023 7:59 AM

## 2023-04-20 NOTE — Hospital Course (Signed)
[name]. is a 67 y.o. male with medical history significant of atrial fibrillation, asthma, congestive heart failure, COPD, coronary artery disease, depression, diabetes mellitus, gout, hyperlipidemia, hypertension, hypogonadism in male, morbid obesity, peripheral vascular disease, OSA on CPAP at night, esophageal cancer status post chemo and G-tube placement.  Patient currently being managed for hyperkalemia, severe dehydration and AKI and acute metabolic encephalopathy. MRI of the brain was negative, ammonia level was also negative. Patient was treated with [facility] for hypercalcemia.  Patient was also started on oral amiodarone for atrial flutter with RVR.  Followed by cardiology. 12/21.  Patient developed urinary retention again, Foley catheter was anchored after repeat I&O cath. 12/22.  Potassium still high, continued on Lokelma, also added a dose of Lasix, give a dose of albumin follow-up blood pressure.

## 2023-04-20 NOTE — Progress Notes (Signed)
Progress Note   Patient: [name]. BMW:[id number] DOB: 06-21-1955 DOA: 03/28/2023     23 DOS: the patient was seen and examined on 04/20/2023   Brief hospital course: [name] [name]. is a 67 y.o. male with medical history significant of atrial fibrillation, asthma, congestive heart failure, COPD, coronary artery disease, depression, diabetes mellitus, gout, hyperlipidemia, hypertension, hypogonadism in male, morbid obesity, peripheral vascular disease, OSA on CPAP at night, esophageal cancer status post chemo and G-tube placement.  Patient currently being managed for hyperkalemia, severe dehydration and AKI and acute metabolic encephalopathy. MRI of the brain was negative, ammonia level was also negative. Patient was treated with [facility] for hypercalcemia.  Patient was also started on oral amiodarone for atrial flutter with RVR.  Followed by cardiology.     Principal Problem:   Hyperkalemia Active Problems:   Delirium   Acute kidney injury (HCC)   Malnutrition of moderate degree   Acute hyperkalemia   Diarrhea   Perinephric fluid collection   Persistent atrial fibrillation (HCC)   Typical atrial flutter (HCC)   Diarrhea of infectious origin   Atrial flutter with rapid ventricular response (HCC)   Malignant neoplasm of lower third of esophagus [facility])   Palliative care encounter   Assessment and Plan: Acute kidney injury secondary to dehydration Hyperkalemia. Hyponatremia. This is secondary to dehydration from diarrhea.  Renal function had improved after fluids.  Patient has persistent hyperkalemia, currently on scheduled Lokelma.  Sodium level low but stable.  Continue to follow.   Delirium secondary to acute metabolic encephalopathy. Klebsiella UTI Patient urine culture was positive for Klebsiella at time of admission, completed antibiotics.  Currently no symptoms.   Acute diarrhea secondary to C. difficile colitis. Patient is status post 10 days of Dificid  followed with 10-day course of vancomycin.   Status post flexible sigmoidoscopy on 12/11.  Normal-appearing mucosa.  Biopsies taken.  Diarrhea has been improving as of 12/12 Diarrhea improving, but the patient still has some loose stools from tube feeding.  Continue to follow.   Possible right perinephric hematoma --Described as possible abscess by radiology but per secure chat communication w/ Dr. Apolinar Junes on 12/9, this is a chornic complex hematoma dating back over a year, nothing that has been drainable thus far, possible underlying renal mass, and in light of significant comorbidities and as he is asymptomatic inpatient evaluation has been deferred, plan is outpatient urology f/u, consider MRI then.    Chronic persistent atrial flutter with rvr Cardiology now following. Remains in rvr, hemodynamically stable , Heart rate still remain at the 110 to 120, cardiology is considering cardioversion tomorrow. Continue beta-blocker and diltiazem.  Patient blood pressure has been intermittently low, I have discontinued diuretics.  Continue to follow.   Urinary retention Resolved   Chronic diastolic congestive heart failure --Recent EF of 55% on echo obtained on November 2024 No exacerbation.   COPD --Continue as needed nebulization    Coronary artery disease,  --Continue aspirin therapy   Diabetes mellitus type 2 with hyperglycemia -- Continue Semglee 30 units nightly (previous dose 35 units)   Hypothyroidism --on levothyroxine     Moderate protein calorie malnutrition. Nutrition Problem: Moderate Malnutrition Etiology: chronic illness (esopahgeal mass, likely malginant) Signs/Symptoms: mild fat depletion, mild muscle depletion Interventions: Tube feeding   Peripheral vascular disease S/p bilateral BKAs --Continue aspirin  --Patient is allergic to statin therapy   OSA on CPAP at night,  --CPAP at night   Esophageal cancer status post chemo and G-tube  placement. Continue bolus  tube feeding.       Subjective:  Patient feels well today, still has some loose stools.  No abdominal pain or nausea vomiting.  Physical Exam: Vitals:   04/20/23 0830 04/20/23 0900 04/20/23 0930 04/20/23 1000  BP: 103/76 (!) 89/65 (!) 82/68 110/77  Pulse: (!) 114 (!) 113 (!) 114   Resp: (!) 24 (!) 22 (!) 21 (!) 25  Temp:      TempSrc:      SpO2: 92% 92% 94% 97%  Weight:      Height:       General exam: Appears calm and comfortable  Respiratory system: Clear to auscultation. Respiratory effort normal. Cardiovascular system: Regular and tachycardic. No JVD, murmurs, rubs, gallops or clicks. No pedal edema. Gastrointestinal system: Abdomen is nondistended, soft and nontender. No organomegaly or masses felt. Normal bowel sounds heard. Central nervous system: Alert and oriented x2. No focal neurological deficits. Extremities: Symmetric 5 x 5 power. Skin: No rashes, lesions or ulcers Psychiatry:  Mood & affect appropriate.    Data Reviewed:  Lab results reviewed.  Family Communication: None  Disposition: Status is: Inpatient Remains inpatient appropriate because: Severity of disease, inpatient procedure.     Time spent: 35 minutes  Author: Marrion Coy, MD 04/20/2023 1:09 PM  For on call review [email or web address].

## 2023-04-21 DIAGNOSIS — N179 Acute kidney failure, unspecified: Secondary | ICD-10-CM | POA: Diagnosis not present

## 2023-04-21 DIAGNOSIS — I4892 Unspecified atrial flutter: Secondary | ICD-10-CM | POA: Diagnosis not present

## 2023-04-21 DIAGNOSIS — I2510 Atherosclerotic heart disease of native coronary artery without angina pectoris: Secondary | ICD-10-CM | POA: Diagnosis not present

## 2023-04-21 DIAGNOSIS — E875 Hyperkalemia: Secondary | ICD-10-CM | POA: Diagnosis not present

## 2023-04-21 DIAGNOSIS — I10 Essential (primary) hypertension: Secondary | ICD-10-CM | POA: Diagnosis not present

## 2023-04-21 DIAGNOSIS — I9589 Other hypotension: Secondary | ICD-10-CM | POA: Diagnosis not present

## 2023-04-21 LAB — BASIC METABOLIC PANEL
Anion gap: 10 (ref 5–15)
BUN: 34 mg/dL — ABNORMAL HIGH (ref 8–23)
CO2: 25 mmol/L (ref 22–32)
Calcium: 9.3 mg/dL (ref 8.9–10.3)
Chloride: 99 mmol/L (ref 98–111)
Creatinine, Ser: 1.04 mg/dL (ref 0.61–1.24)
GFR, Estimated: >60 mL/min (ref >60)
Glucose, Bld: 180 mg/dL — ABNORMAL HIGH (ref 70–99)
Potassium: 5.2 mmol/L — ABNORMAL HIGH (ref 3.5–5.1)
Sodium: 134 mmol/L — ABNORMAL LOW (ref 135–145)

## 2023-04-21 LAB — GLUCOSE, CAPILLARY
Glucose-Capillary: 167 mg/dL — ABNORMAL HIGH (ref 70–99)
Glucose-Capillary: 231 mg/dL — ABNORMAL HIGH (ref 70–99)
Glucose-Capillary: 179 mg/dL — ABNORMAL HIGH (ref 70–99)
Glucose-Capillary: 157 mg/dL — ABNORMAL HIGH (ref 70–99)

## 2023-04-21 MED ORDER — FUROSEMIDE 20 MG PO TABS
20.0000 mg | ORAL_TABLET | Freq: Every day | ORAL | Status: DC
  Administered 2023-04-21 – 2023-05-01 (×11): 20 mg
  Filled 2023-04-21 (×11): qty 1

## 2023-04-21 MED ORDER — SODIUM CHLORIDE 0.9 % IV SOLN: INTRAVENOUS | Status: AC

## 2023-04-21 MED ORDER — ALBUMIN HUMAN 25 % IV SOLN
25.0000 g | Freq: Once | INTRAVENOUS | Status: AC
  Administered 2023-04-21: 25 g via INTRAVENOUS
  Filled 2023-04-21: qty 100

## 2023-04-21 MED ORDER — AMIODARONE HCL 200 MG PO TABS
400.0000 mg | ORAL_TABLET | Freq: Two times a day (BID) | ORAL | Status: AC
  Administered 2023-04-21 – 2023-04-29 (×17): 400 mg
  Filled 2023-04-21 (×17): qty 2

## 2023-04-21 MED ORDER — CHLORHEXIDINE GLUCONATE CLOTH 2 % EX PADS
6.0000 | MEDICATED_PAD | Freq: Every day | CUTANEOUS | Status: DC
  Administered 2023-04-21 – 2023-05-03 (×13): 6 via TOPICAL

## 2023-04-21 MED ORDER — AMIODARONE HCL 200 MG PO TABS
200.0000 mg | ORAL_TABLET | Freq: Once | ORAL | Status: AC
  Administered 2023-04-21: 200 mg
  Filled 2023-04-21: qty 1

## 2023-04-21 NOTE — Consult Note (Signed)
PHARMACY CONSULT NOTE - ELECTROLYTES  Pharmacy Consult for Electrolyte Monitoring and Replacement   Recent Labs: Height: 5\' 5"  (165.1 cm) (Bilateral BKAs) Weight: 119.7 kg (263 lb 14.3 oz) IBW/kg (Calculated) : 61.5 Estimated Creatinine Clearance: 81.5 mL/min (by C-G formula based on SCr of 1.04 mg/dL).  Potassium (mmol/L)  Date Value  04/21/2023 5.2 (H)  07/12/2011 4.4   Magnesium (mg/dL)  Date Value  61/60/7371 2.3   Calcium (mg/dL)  Date Value  10/24/9483 9.3   Calcium, Total (mg/dL)  Date Value  46/27/0350 8.8   Albumin (g/dL)  Date Value  09/38/1829 2.4 (L)   Phosphorus (mg/dL)  Date Value  93/71/6967 4.4   Sodium (mmol/L)  Date Value  04/21/2023 134 (L)  07/12/2011 141   Assessment  Alec A Wittmann Montez Hageman. is a 67 y.o. male presenting with hyperkalemia, severe dehydration and AKI. PMH significant for atrial fibrillation, asthma, congestive heart failure, COPD, coronary artery disease, depression, diabetes mellitus, gout, hyperlipidemia, hypertension, hypogonadism in male, morbid obesity, peripheral vascular disease, OSA on CPAP at night, esophageal cancer status post chemo and G-tube placement. Pharmacy has been consulted to monitor and replace electrolytes.  Diet: Tube feeds MIVF: Free water 140 mL per tube 6x/day  Goal of Therapy: K+ > 4 Mg > 2  Plan:  Continue Lokelma 10 g daily. Lasix was discontinued per provider. Not sure what potassium content is of tube feeds. May consider low potassium formulation F/u with labs as deem appropriate.   Thank you for allowing pharmacy to be a part of this patient's care.  Ronnald Ramp, PharmD, BCPS 04/21/2023 7:32 AM

## 2023-04-21 NOTE — Progress Notes (Signed)
Progress Note   Patient: [name]. QIO:[id number] DOB: 1955-12-18 DOA: 03/28/2023     24 DOS: the patient was seen and examined on 04/21/2023   Brief hospital course: Conway Moynihan. is a 67 y.o. male with medical history significant of atrial fibrillation, asthma, congestive heart failure, COPD, coronary artery disease, depression, diabetes mellitus, gout, hyperlipidemia, hypertension, hypogonadism in male, morbid obesity, peripheral vascular disease, OSA on CPAP at night, esophageal cancer status post chemo and G-tube placement.  Patient currently being managed for hyperkalemia, severe dehydration and AKI and acute metabolic encephalopathy. MRI of the brain was negative, ammonia level was also negative. Patient was treated with [facility] for hypercalcemia.  Patient was also started on oral amiodarone for atrial flutter with RVR.  Followed by cardiology. 12/21.  Patient developed urinary retention again, Foley catheter was anchored after repeat I&O cath. 12/22.  Potassium still high, continued on Lokelma, also added a dose of Lasix, give a dose of albumin follow-up blood pressure.      Principal Problem:   Hyperkalemia Active Problems:   Delirium   Acute kidney injury (HCC)   Malnutrition of moderate degree   Acute hyperkalemia   Diarrhea   Perinephric fluid collection   Persistent atrial fibrillation (HCC)   Typical atrial flutter (HCC)   Diarrhea of infectious origin   Atrial flutter with rapid ventricular response (HCC)   Malignant neoplasm of lower third of esophagus [facility])   Palliative care encounter   Assessment and Plan: Acute kidney injury secondary to dehydration Hyperkalemia. Hyponatremia. This is secondary to dehydration from diarrhea.  Renal function had improved after fluids.  Patient has persistent hyperkalemia, currently on scheduled Lokelma.  Sodium level low but stable.  Discussed with pharmacy, there is no other potassium binder to choose for  tube feeding.  Decision was made to give her lower dose Lasix.  Continue to follow.   Delirium secondary to acute metabolic encephalopathy. Klebsiella UTI Patient urine culture was positive for Klebsiella at time of admission, completed antibiotics.  Currently no symptoms.   Acute diarrhea secondary to C. difficile colitis. Patient is status post 10 days of Dificid followed with 10-day course of vancomycin.   Status post flexible sigmoidoscopy on 12/11.  Normal-appearing mucosa.  Biopsies taken.  Diarrhea has been improving as of 12/12 Diarrhea improving, but the patient still has some loose stools from tube feeding.  Continue as needed Imodium.   Possible right perinephric hematoma --Described as possible abscess by radiology but per secure chat communication w/ Dr. Apolinar Junes on 12/9, this is a chornic complex hematoma dating back over a year, nothing that has been drainable thus far, possible underlying renal mass, and in light of significant comorbidities and as he is asymptomatic inpatient evaluation has been deferred, plan is outpatient urology f/u, consider MRI then.    Chronic persistent atrial flutter with rvr Intermittent hypotension. Cardiology now following. Remains in rvr, hemodynamically stable , Heart rate still remain at the 110 to 120, cardiology is considering cardioversion on Monday. Continue beta-blocker and diltiazem.  Patient blood pressure has been intermittently low, gave a dose of albumin.   Urinary retention secondary to benign prostate hypertrophy. Patient developed significant urinary retention since last night with residual over 500 mL, patient had In-N-Out cath multiple times, Foley catheter was anchored again.   Chronic diastolic congestive heart failure --Recent EF of 55% on echo obtained on November 2024 No exacerbation.   COPD --Continue as needed nebulization    Coronary artery disease,  --  Continue aspirin therapy   Diabetes mellitus type 2 with  hyperglycemia -- Continue Semglee 30 units nightly (previous dose 35 units)   Hypothyroidism --on levothyroxine     Moderate protein calorie malnutrition. Nutrition Problem: Moderate Malnutrition Etiology: chronic illness (esopahgeal mass, likely malginant) Signs/Symptoms: mild fat depletion, mild muscle depletion Interventions: Tube feeding   Peripheral vascular disease S/p bilateral BKAs --Continue aspirin  --Patient is allergic to statin therapy   OSA on CPAP at night,  --CPAP at night   Esophageal cancer status post chemo and G-tube placement. Continue bolus tube feeding.       Subjective:  Patient feels well, still has significant loose stools.  No abdominal pain no nausea vomiting.  Physical Exam: Vitals:   04/21/23 0344 04/21/23 0400 04/21/23 0500 04/21/23 0800  BP:      Pulse:    (!) 112  Resp:  19    Temp:  98.2 F (36.8 C)  97.8 F (36.6 C)  TempSrc:  Axillary  Oral  SpO2:   92% 92%  Weight: 119.7 kg     Height:       General exam: Appears calm and comfortable  Respiratory system: Clear to auscultation. Respiratory effort normal. Cardiovascular system: Appear regular with tachycardia. No JVD, murmurs, rubs, gallops or clicks.  Gastrointestinal system: Abdomen is nondistended, soft and nontender. No organomegaly or masses felt. Normal bowel sounds heard. Central nervous system: Alert and oriented x2. No focal neurological deficits. Extremities: Bilateral BKA. Skin: No rashes, lesions or ulcers Psychiatry:  Mood & affect appropriate.    Data Reviewed:  Lab results reviewed.  Family Communication: Not able to reach wife  Disposition: Status is: Inpatient Remains inpatient appropriate because: Severity of disease, inpatient procedure.     Time spent: 50 minutes  Author: Marrion Coy, MD 04/21/2023 11:35 AM  For on call review [email or web address].

## 2023-04-21 NOTE — Plan of Care (Signed)
  Problem: Fluid Volume: Goal: Ability to maintain a balanced intake and output will improve Outcome: Progressing   Problem: Metabolic: Goal: Ability to maintain appropriate glucose levels will improve Outcome: Progressing   Problem: Coping: Goal: Level of anxiety will decrease Outcome: Progressing   Problem: Skin Integrity: Goal: Risk for impaired skin integrity will decrease Outcome: Progressing

## 2023-04-21 NOTE — Progress Notes (Signed)
Progress Note  Patient Name: [name]. Date of Encounter: 04/21/2023  Primary Cardiologist: Kirke Corin  Subjective   No chest pain, dyspnea, palpitations, dizziness, presyncope, or syncope. He remains in atrial flutter with ventricular rates in the 1-teens bpm.  He is for DCCV 12/23.    Inpatient Medications    Scheduled Meds:  amiodarone  200 mg Per Tube BID   apixaban  5 mg Per Tube BID   ascorbic acid  500 mg Per Tube Daily   aspirin  81 mg Per Tube Daily   cholecalciferol  2,000 Units Per Tube Daily   clonazePAM  0.5 mg Per Tube BID   cyanocobalamin  10,000 mcg Per Tube Daily   diltiazem  60 mg Per Tube Q6H   ezetimibe  10 mg Per Tube Daily   feeding supplement (GLUCERNA 1.5 CAL)  237 mL Per Tube 6 X Daily   ferrous sulfate  300 mg Per Tube Daily   fiber supplement (BANATROL TF)  60 mL Per Tube TID   free water  140 mL Per Tube 6 X Daily   insulin aspart  0-5 Units Subcutaneous QHS   insulin aspart  0-9 Units Subcutaneous TID WC   insulin glargine-yfgn  30 Units Subcutaneous QHS   levothyroxine  25 mcg Per Tube Q0600   metoprolol tartrate  25 mg Per Tube Q6H   multivitamin with minerals  1 tablet Per Tube Daily   pramipexole  2 mg Per Tube QHS   primidone  250 mg Per Tube BID   sodium zirconium cyclosilicate  10 g Per Tube Daily   Continuous Infusions:  PRN Meds: acetaminophen, haloperidol, ipratropium-albuterol, loperamide HCl, nitroGLYCERIN, ondansetron, mouth rinse   Vital Signs    Vitals:   04/20/23 1900 04/21/23 0344 04/21/23 0400 04/21/23 0500  BP:      Pulse:      Resp:   19   Temp: 98.4 F (36.9 C)  98.2 F (36.8 C)   TempSrc: Axillary  Axillary   SpO2: 90%   92%  Weight:  119.7 kg    Height:        Intake/Output Summary (Last 24 hours) at 04/21/2023 0737 Last data filed at 04/21/2023 0049 Gross per 24 hour  Intake --  Output 1600 ml  Net -1600 ml   Filed Weights   04/19/23 0500 04/20/23 0500 04/21/23 0344  Weight: 119 kg 119.5  kg 119.7 kg    Telemetry    Atrial flutter with RVR with ventricular rates in the 1-teens bpm - Personally Reviewed  ECG    No new tracings - Personally Reviewed  Physical Exam   GEN: No acute distress.   Neck: No JVD. Cardiac: Tachycardic, no murmurs, rubs, or gallops.  Respiratory: Clear to auscultation bilaterally.  GI: Soft, nontender, non-distended.   MS: Status post bilateral BKA. Neuro:  Alert and oriented x 3; Nonfocal.  Psych: Normal affect.  Labs    Chemistry Recent Labs  Lab 04/17/23 [phone] 04/18/23 0829 04/19/23 0531 04/20/23 0356 04/21/23 0448  NA 135  --  134* 134* 134*  K 5.2*   < > 5.5* 5.2* 5.2*  CL 103  --  99 98 99  CO2 24  --  25 28 25   GLUCOSE 241*  --  254* 148* 180*  BUN 38*  --  33* 31* 34*  CREATININE 1.11  --  0.92 0.92 1.04  CALCIUM 8.9  --  9.1 9.4 9.3  PROT 6.6  --   --   --   --  ALBUMIN 2.4*  --   --   --   --   AST 17  --   --   --   --   ALT 34  --   --   --   --   ALKPHOS 129*  --   --   --   --   BILITOT <0.2  --   --   --   --   GFRNONAA >60  --  >60 >60 >60  ANIONGAP 8  --  10 8 10    < > = values in this interval not displayed.     Hematology Recent Labs  Lab 04/17/23 0454 04/19/23 0531 04/20/23 0356  WBC 9.8 12.4* 10.8*  RBC 4.31 4.07* 4.03*  HGB 12.5* 12.0* 11.7*  HCT 39.5 36.9* 37.2*  MCV 91.6 90.7 92.3  MCH 29.0 29.5 29.0  MCHC 31.6 32.5 31.5  RDW 14.4 14.3 14.4  PLT 282 287 294    Cardiac EnzymesNo results for input(s): "TROPONINI" in the last 168 hours. No results for input(s): "TROPIPOC" in the last 168 hours.   BNPNo results for input(s): "BNP", "PROBNP" in the last 168 hours.   DDimer No results for input(s): "DDIMER" in the last 168 hours.   Radiology    No results found.  Cardiac Studies   2D echo 03/17/2023: 1. Left ventricular ejection fraction, by estimation, is 55 %. The left  ventricle has normal function. The left ventricle demonstrates regional  wall motion abnormalities (mild  hypokinesis of the basal inferior wall).  There is mild left ventricular  hypertrophy. Left ventricular diastolic parameters are consistent with  Grade I diastolic dysfunction (impaired relaxation). The average left  ventricular global longitudinal strain is -12.0 %.   2. Right ventricular systolic function is normal. The right ventricular  size is normal. Tricuspid regurgitation signal is inadequate for assessing  PA pressure.   3. The mitral valve is normal in structure. No evidence of mitral valve  regurgitation. No evidence of mitral stenosis.   4. The aortic valve is tricuspid. There is mild calcification of the  aortic valve. Aortic valve regurgitation is not visualized. Aortic valve  sclerosis/calcification is present, without any evidence of aortic  stenosis.   5. The inferior vena cava is normal in size with greater than 50%  respiratory variability, suggesting right atrial pressure of 3 mmHg.   Patient Profile     67 y.o. male with history of CAD status post CABG in 2006, A-fib status post prior DCCV, esophageal cancer status postchemotherapy and G-tube placement, PAD status post bilateral BKA, DM2, HTN, and obesity who we are seeing for atrial flutter with RVR.  Assessment & Plan    Atrial flutter with RVR - History of paroxysmal Afib with multiple cardioversions - CHA2DS2-VASc of at least 5  - Continue apixaban 5 mg BID - Remains in atrial flutter with continued elevated rates 110-120 - IV digoxin has not been effective   - Continue short acting diltiazem and metoprolol tartrate given G-tube, as BP allows.  Not able to uptitrate due to soft blood pressure -Continue amiodarone 200 mg twice daily through 12/30 with 200 mg daily thereafter - Not a candidate for TEE given esophageal cancer - Cardioversion without TEE can be performed on December 23 as long as request does not get interrupted any further - Two-person verbal consent obtained 12/21 with patient's spouse over the  phone - N.p.o. at midnight heading into 12/23 with plans for DCCV on 12/23 - Amiodarone  drip was not effective in rate control   Acute metabolic encephalopathy - Improved  - Esophageal cancer s/p G tube - C diff with profuse diarrhea - AMS with agitation, requires safety observations - Management per IM - MRI of the brain without acute process   CAD s/p CABG/PAD s/p BKA - Denies chest pain - Continue ASA and ezetimibe - Statin intolerance  Hyperkalemia - Stable - Per primary service   Normocytic anemia - Hgb stable   Informed Consent   Shared Decision Making/Informed Consent{  The risks (stroke, cardiac arrhythmias rarely resulting in the need for a temporary or permanent pacemaker, skin irritation or burns and complications associated with conscious sedation including aspiration, arrhythmia, respiratory failure and death), benefits (restoration of normal sinus rhythm) and alternatives of a direct current cardioversion were explained in detail to Mr. Mcglaun and he agrees to proceed.        For questions or updates, please contact [facility] Please consult [email or web address] for contact info under Cardiology/STEMI.    Signed, Eula Listen, PA-C [facility] Pager: [phone] 04/21/2023, 7:37 AM

## 2023-04-21 NOTE — Progress Notes (Signed)
Patient bladder scanned at 0030 for 488. Order for In and out cath. Patient cath 800 ml clear yellow urine. Patient has had no out put since [place]. Cath at [phone]. Bladder Scanned for 125 ml. Patient states he doesn't feel like he has to urinate. Will inform oncoming Nurse/Charge Nurse.

## 2023-04-21 NOTE — Plan of Care (Signed)
  Problem: Nutritional: Goal: Maintenance of adequate nutrition will improve Outcome: Progressing Goal: Progress toward achieving an optimal weight will improve Outcome: Progressing   

## 2023-04-21 NOTE — Progress Notes (Signed)
Nurse Diannia Ruder called Clinical research associate on house phone [phone] at 9:21 PM stating patients wife wanted to talk to me. Asked Diannia Ruder to take a message since I was in a patients room performing personal care. After complementing care I was walking up to the desk and patients wife called again at 9:51 PM. At that time Nurse Diannia Ruder transferred patients wife to said house phone. Patients wife asked me how her husband was doing and if he was confused. I told patients wife he was doing fine VS WNL, no CO pain and sleeping in bed at 8:55 PM. Patient remains confused oriented to self. Wife started rasing her voice to Clinical research associate, do not call me when he is confused its your responsibility to deal with it.   I told the wife that I did not call her this evening. Well someone did its on my phone. Writer asked what number and time. Wife could only give Clinical research associate  the last four digests of the number  number ([phone]) and continued to raise her voice about being called and don't bother her. I asked her politely to stop yelling at me and she then started yelling loader. I asked her to hold on a minute so I could get the charge Nurse, wife contained to yell at Clinical research associate however I just handed the phone to Charge Nurse Trinna Post. Wife continued to yell while Trinna Post  had the phone. Writer asked staff/tech if they had called the patients wife. Nobody had called the wife. Writer checked phone log at [phone]. No calls were made to patients wife today, last call to patients number [phone] was made on 03/2123 at 8:45 AM.

## 2023-04-22 ENCOUNTER — Encounter
Admission: EM | Disposition: A | Discharge status: Skilled Nursing Facility | Payer: Self-pay | Source: Home / Self Care | Attending: Obstetrics and Gynecology

## 2023-04-22 ENCOUNTER — Inpatient Hospital Stay: Payer: Self-pay | Admitting: Anesthesiology

## 2023-04-22 DIAGNOSIS — I483 Typical atrial flutter: Secondary | ICD-10-CM

## 2023-04-22 DIAGNOSIS — E875 Hyperkalemia: Secondary | ICD-10-CM | POA: Diagnosis not present

## 2023-04-22 DIAGNOSIS — I4892 Unspecified atrial flutter: Secondary | ICD-10-CM | POA: Diagnosis not present

## 2023-04-22 LAB — CBC WITH DIFFERENTIAL/PLATELET
Abs Immature Granulocytes: 0.06 10*3/uL (ref 0.00–0.07)
Basophils Absolute: 0.1 10*3/uL (ref 0.0–0.1)
Basophils Relative: 1 %
Eosinophils Absolute: 0.3 10*3/uL (ref 0.0–0.5)
Eosinophils Relative: 3 %
HCT: 34 % — ABNORMAL LOW (ref 39.0–52.0)
Hemoglobin: 11.2 g/dL — ABNORMAL LOW (ref 13.0–17.0)
Immature Granulocytes: 1 %
Lymphocytes Relative: 13 %
Lymphs Abs: 1.3 10*3/uL (ref 0.7–4.0)
MCH: 29.4 pg (ref 26.0–34.0)
MCHC: 32.9 g/dL (ref 30.0–36.0)
MCV: 89.2 fL (ref 80.0–100.0)
Monocytes Absolute: 0.5 10*3/uL (ref 0.1–1.0)
Monocytes Relative: 5 %
Neutro Abs: 7.9 10*3/uL — ABNORMAL HIGH (ref 1.7–7.7)
Neutrophils Relative %: 77 %
Platelets: 256 10*3/uL (ref 150–400)
RBC: 3.81 MIL/uL — ABNORMAL LOW (ref 4.22–5.81)
RDW: 14.5 % (ref 11.5–15.5)
WBC: 10.2 10*3/uL (ref 4.0–10.5)
nRBC: 0 % (ref 0.0–0.2)

## 2023-04-22 LAB — GLUCOSE, CAPILLARY
Glucose-Capillary: 165 mg/dL — ABNORMAL HIGH (ref 70–99)
Glucose-Capillary: 197 mg/dL — ABNORMAL HIGH (ref 70–99)
Glucose-Capillary: 221 mg/dL — ABNORMAL HIGH (ref 70–99)
Glucose-Capillary: 205 mg/dL — ABNORMAL HIGH (ref 70–99)

## 2023-04-22 LAB — BASIC METABOLIC PANEL
Anion gap: 9 (ref 5–15)
BUN: 35 mg/dL — ABNORMAL HIGH (ref 8–23)
CO2: 26 mmol/L (ref 22–32)
Calcium: 8.9 mg/dL (ref 8.9–10.3)
Chloride: 99 mmol/L (ref 98–111)
Creatinine, Ser: 1.09 mg/dL (ref 0.61–1.24)
GFR, Estimated: >60 mL/min (ref >60)
Glucose, Bld: 203 mg/dL — ABNORMAL HIGH (ref 70–99)
Potassium: 4.9 mmol/L (ref 3.5–5.1)
Sodium: 134 mmol/L — ABNORMAL LOW (ref 135–145)

## 2023-04-22 SURGERY — CARDIOVERSION
Anesthesia: General

## 2023-04-22 MED ORDER — PROPOFOL 1000 MG/100ML IV EMUL
INTRAVENOUS | Status: AC
  Filled 2023-04-22: qty 100

## 2023-04-22 MED ORDER — LIDOCAINE HCL (PF) 2 % IJ SOLN
INTRAMUSCULAR | Status: AC
  Filled 2023-04-22: qty 5

## 2023-04-22 NOTE — Care Management Important Message (Signed)
Important Message  Patient Details  Name: [name]. MRN: [id number] Date of Birth: 1955-07-16   Important Message Given:  Yes - Medicare IM Left IM with PTs Nurse as Pt was on Contact Precautions.    Bernadette Hoit 04/22/2023, 11:59 AM

## 2023-04-22 NOTE — Progress Notes (Signed)
Nutrition Follow-up  DOCUMENTATION CODES:   Obesity unspecified, Non-severe (moderate) malnutrition in context of chronic illness  INTERVENTION:   -TF via g-tube:    237 ml Glucerna 1.5 6 times daily  70 ml free water flush before each feeding and 70 ml free water flush after each after each feeding administration   Tube feeding regimen provides 2136 kcal (100% of needs), 118 grams of protein, and 1080 ml of H2O.  Total free water: 1920 ml daily   -Allow ice chips for dry mouth per discussion with MD -Continue 1 packet Banatrol TID   NUTRITION DIAGNOSIS:   Moderate Malnutrition related to chronic illness (esopahgeal mass, likely malginant) as evidenced by mild fat depletion, mild muscle depletion.  Ongoing  GOAL:   Patient will meet greater than or equal to 90% of their needs  Progressing   MONITOR:   TF tolerance  REASON FOR ASSESSMENT:   Consult Assessment of nutrition requirement/status  ASSESSMENT:   Pt with medical history significant of atrial fibrillation, asthma, congestive heart failure, COPD, coronary artery disease, depression, diabetes mellitus, gout, hyperlipidemia, hypertension, hypogonadism in male, morbid obesity, peripheral vascular disease, OSA on CPAP at night, esophageal cancer status post chemo and G-tube placement.  Reviewed I/O's: -1.3 L x 24 hours and -1.9 L since 04/08/23  UOP: 1.5 L x 24 hours   Per cardiology notes, pt with a-fib with RVR and rate is difficult to control. Plan to continue amiodarone.   Pt wife left a message on RD voicemail requesting call back. Noted RD was also consulted by MD. RD returned call to wife and spoke with pt wife over the phone. Pt wife expressed concern over TF regimen and missed doses. RD explained that pt was NPO and diet/feeds were held this AM to accommodate for possible procedure (however, feeds were resumed this AM after cardiology cancelled procedure). Wife also asked about TF regimen and timing of TF.  RD reviewed regimen as it was currently ordered and discussed timing or TF administration. RD also discussed that there is some flexibility in administration of feedings and medications to couple care. Per pt wife, she would like to continue with 6 feeds per day. RD offered to change TF regimen to more frequent administration, however, pt wife feels that feeds 6 times per day help with pt's hunger and would like to continue on this regimen. Pt wife reports diarrhea is improving and thankful that banatrol was resumed.   Pt wife also expressed concern about TF supplies and syringes not being cleaned after use. She reports that this was also reported to RN and Oceanographer; RD passed along concern to RN and Publishing copy and also put in care order. RD discussed TF regimen instructions and wife's concerns with RN and unit director and asked to pass on to next shift to continuity of care.   Case also discussed with MD.   Pt has experienced a 2.1% wt loss over the past week. Noted pt -1.3 L over the past 24 hours. RD will continue to monitor wt trends. Wife   Medications reviewed and include vitamin C, vitamin D3, klonopin, ferrous sulfate, lasix, and lokelma.   Per TOC notes, plan for SNF placement ([facility]) at discharge.   Labs reviewed: Na: 134, CBGS: 157-231 (inpatient orders for glycemic control are 0-5 units insulin aspart daily at bedtime, 0-9 units inuslin glargine-yfgn daily at bedtime).    Diet Order:   Diet Order  Diet NPO time specified Except for: Sips with Meds  Diet effective midnight                   EDUCATION NEEDS:   Education needs have been addressed  Skin:  Skin Assessment: Reviewed RN Assessment  Last BM:  04/21/23 (type 6)  Height:   Ht Readings from Last 1 Encounters:  04/02/23 5\' 5"  (1.651 m)    Weight:   Wt Readings from Last 1 Encounters:  04/22/23 120.4 kg    Ideal Body Weight:  75.2 kg (adjusted for bilateral  BKAs)  BMI:  Body mass index is 44.17 kg/m.  Estimated Nutritional Needs:   Kcal:  9604-5409  Protein:  115-130 grams  Fluid:  > 2 L    Levada Schilling, RD, LDN, CDCES Registered Dietitian III Certified Diabetes Care and Education Specialist If unable to reach this RD, please use "RD Inpatient" group chat on secure chat between hours of 8am-4 pm daily

## 2023-04-22 NOTE — Addendum Note (Signed)
Addendum  created 04/22/23 2440 by Jamey Harman, Cleda Mccreedy, MD   Intraprocedure Staff edited

## 2023-04-22 NOTE — Progress Notes (Signed)
Physical Therapy Treatment Patient Details Name: [name]. MRN: [id number] DOB: [birth year] Today's Date: 04/22/2023   History of Present Illness Pt is a 67 yo male pt admitted for hyperkalemia, AKI, UTI, and perinephric abscess. PMH includes Afib, asthma, CHF, COPD, CAD, depression, PVD, and esophageal cancer. Currently with PEG tube placed. Of note B LE BKA.    PT Comments  Patient is agreeable to PT session. He is confused but awake and cooperative. Patient able to sit on edge of bed for 5 minutes with no significant increase in heart rate. Sitting tolerance limited by fatigue. Continue to recommend PT to maximize independence and facilitate return to prior level of function. Recommend rehabilitation < 3 hours/day after this hospital stay.    If plan is discharge home, recommend the following: Two people to help with walking and/or transfers;A lot of help with bathing/dressing/bathroom;Assistance with cooking/housework;Assistance with feeding;Direct supervision/assist for medications management;Direct supervision/assist for financial management;Assist for transportation;Help with stairs or ramp for entrance;Supervision due to cognitive status   Can travel by private vehicle     No  Equipment Recommendations   (to be determined at next level of care)    Recommendations for Other Services       Precautions / Restrictions Precautions Precautions: Fall     Mobility  Bed Mobility Overal bed mobility: Needs Assistance Bed Mobility: Supine to Sit, Sit to Supine     Supine to sit: Mod assist Sit to supine: Min assist   General bed mobility comments: assistance for trunk support to sit upright and assistance for LE support to return to bed. verbal cues for technique.    Transfers                        Ambulation/Gait                   Stairs             Wheelchair Mobility     Tilt Bed    Modified Rankin (Stroke Patients Only)        Balance Overall balance assessment: Needs assistance Sitting-balance support: Bilateral upper extremity supported, Feet unsupported Sitting balance-Leahy Scale: Fair Sitting balance - Comments: close stand by assistance for safety.  patient using UE support intermittently for support and fatigued with activity. sitting tolerance around 5 minutes                                    Cognition Arousal: Alert Behavior During Therapy: WFL for tasks assessed/performed Overall Cognitive Status: Impaired/Different from baseline                                 General Comments: patient is confused but cooperative during session.. patient is able to follow single step commands consistently. he is confused about recent events and is easily distracted.        Exercises      General Comments General comments (skin integrity, edema, etc.): heart rate 115bpm. encouraged positioning in bed to promote knee extension.      Pertinent Vitals/Pain Pain Assessment Pain Assessment: No/denies pain    Home Living                          Prior Function  PT Goals (current goals can now be found in the care plan section) Acute Rehab PT Goals Patient Stated Goal: get home PT Goal Formulation: With patient Time For Goal Achievement: 05/06/23 Potential to Achieve Goals: Fair Progress towards PT goals: Progressing toward goals    Frequency    Min 1X/week      PT Plan      Co-evaluation              AM-PAC PT "6 Clicks" Mobility   Outcome Measure  Help needed turning from your back to your side while in a flat bed without using bedrails?: A Lot Help needed moving from lying on your back to sitting on the side of a flat bed without using bedrails?: A Lot Help needed moving to and from a bed to a chair (including a wheelchair)?: Total Help needed standing up from a chair using your arms (e.g., wheelchair or bedside chair)?:  Total Help needed to walk in hospital room?: Total Help needed climbing 3-5 steps with a railing? : Total 6 Click Score: 8    End of Session   Activity Tolerance: Patient limited by fatigue Patient left: in bed;with call bell/phone within reach;with bed alarm set Nurse Communication: Mobility status PT Visit Diagnosis: Muscle weakness (generalized) (M62.81);Other abnormalities of gait and mobility (R26.89)     Time: 1430-1450 PT Time Calculation (min) (ACUTE ONLY): 20 min  Charges:    $Therapeutic Activity: 8-22 mins PT General Charges $$ ACUTE PT VISIT: 1 Visit                     Donna Bernard, PT, MPT    Ina Homes 04/22/2023, 3:15 PM

## 2023-04-22 NOTE — Anesthesia Preprocedure Evaluation (Addendum)
Anesthesia Evaluation  Patient identified by MRN, date of birth, ID band Patient confused    Reviewed: Allergy & Precautions, NPO status , Patient's Chart, lab work & pertinent test results  Airway Mallampati: III  TM Distance: <3 FB Neck ROM: full    Dental  (+) Chipped, Poor Dentition, Missing   Pulmonary asthma , sleep apnea , COPD, former smoker   Pulmonary exam normal        Cardiovascular hypertension, + CAD, + Past MI, + Peripheral Vascular Disease and +CHF  + dysrhythmias Atrial Fibrillation      Neuro/Psych  PSYCHIATRIC DISORDERS       Neuromuscular disease    GI/Hepatic negative GI ROS, Neg liver ROS,,,  Endo/Other  diabetesHypothyroidism    Renal/GU Renal disease  negative genitourinary   Musculoskeletal   Abdominal   Peds  Hematology negative hematology ROS (+)   Anesthesia Other Findings Past Medical History: No date: Arrhythmia     Comment:  atrial fibrillation No date: Asthma No date: CHF (congestive heart failure) (HCC) No date: COPD (chronic obstructive pulmonary disease) (HCC) No date: Coronary artery disease No date: Depression No date: Diabetes mellitus without complication (HCC) No date: Gout No date: History anabolic steroid use No date: Hyperlipidemia No date: Hypertension No date: Hypogonadism in male No date: MI (myocardial infarction) (HCC) No date: Morbid obesity (HCC) No date: Myocardial infarction (HCC) No date: Peripheral vascular disease (HCC) No date: Perirectal abscess No date: Pleurisy No date: Sleep apnea     Comment:  CPAP at night, no oxygen No date: Varicella  Past Surgical History: No date: ABDOMINAL AORTIC ANEURYSM REPAIR 01/10/2021: ACHILLES TENDON SURGERY; Left     Comment:  Procedure: ACHILLES LENGTHENING/KIDNER;  Surgeon: Rosetta Posner, DPM;  Location: [facility];  Service: Podiatry;                Laterality: Left; 10/03/2021: AMPUTATION;  Left     Comment:  Procedure: AMPUTATION BELOW KNEE;  Surgeon: Renford Dills, MD;  Location: [facility];  Service: Vascular;                Laterality: Left; 05/24/2022: AMPUTATION; Right     Comment:  Procedure: AMPUTATION BELOW KNEE;  Surgeon: Annice Needy, MD;  Location: [facility];  Service: General;                Laterality: Right; 02/10/2016: AMPUTATION TOE; Right     Comment:  Procedure: AMPUTATION TOE 3RD TOE;  Surgeon: Gwyneth Revels, DPM;  Location: [facility];  Service: Podiatry;                Laterality: Right; 02/24/2020: AMPUTATION TOE; Left     Comment:  Procedure: AMPUTATION TOE;  Surgeon: Rosetta Posner, DPM;              Location: [facility];  Service: Podiatry;  Laterality:               Left; 02/29/2020: APPLICATION OF WOUND VAC; Left     Comment:  Procedure: APPLICATION OF WOUND VAC;  Surgeon: Rosetta Posner, DPM;  Location:  [facility];  Service: Podiatry;                Laterality: Left; 03/15/2023: BIOPSY     Comment:  Procedure: BIOPSY;  Surgeon: Wyline Mood, MD;  Location:              [facility] ENDOSCOPY;  Service: Gastroenterology;; 04/10/2023: BIOPSY     Comment:  Procedure: BIOPSY;  Surgeon: Norma Fredrickson, Boykin Nearing, MD;                Location: [facility] ENDOSCOPY;  Service: Gastroenterology;; No date: CARDIAC CATHETERIZATION 02/13/2022: CARDIOVERSION; [name]     Comment:  Procedure: CARDIOVERSION;  Surgeon: Lamar Blinks,               MD;  Location: [facility];  Service: Cardiovascular;                Laterality: [name]; 11/18/2015: COLONOSCOPY WITH PROPOFOL; [name]     Comment:  Procedure: COLONOSCOPY WITH PROPOFOL;  Surgeon: Scot Jun, MD;  Location: [facility] ENDOSCOPY;  Service:               Endoscopy;  Laterality: [name]; No date: CORONARY ARTERY BYPASS GRAFT 02/02/2020: CORONARY STENT INTERVENTION; [name]     Comment:  Procedure: CORONARY STENT INTERVENTION;  Surgeon:               Marcina Millard, MD;   Location: [facility] INVASIVE CV               LAB;  Service: Cardiovascular;  Laterality: [name]; 03/15/2023: ESOPHAGOGASTRODUODENOSCOPY (EGD) WITH PROPOFOL; [name]     Comment:  Procedure: ESOPHAGOGASTRODUODENOSCOPY (EGD) WITH               PROPOFOL;  Surgeon: Wyline Mood, MD;  Location: [facility]               ENDOSCOPY;  Service: Gastroenterology;  Laterality: [name]; 04/10/2023: FLEXIBLE SIGMOIDOSCOPY; [name]     Comment:  Procedure: FLEXIBLE SIGMOIDOSCOPY;  Surgeon: [place],               Boykin Nearing, MD;  Location: [facility] ENDOSCOPY;  Service:               Gastroenterology;  Laterality: [name]; 03/18/2023: GASTROSTOMY; [name]     Comment:  Procedure: INSERTION OF GASTROSTOMY TUBE;  Surgeon:               Carolan Shiver, MD;  Location: [facility] OR[facility];  Service:              General;  Laterality: [name]; 03/15/2023: IMPACTION REMOVAL     Comment:  Procedure: IMPACTION REMOVAL;  Surgeon: Wyline Mood, MD;              Location: [facility] ENDOSCOPY;  Service: Gastroenterology;; 08/07/2021: INCISION AND DRAINAGE; Left     Comment:  Procedure: INCISION AND DRAINAGE-Partial Calcanectomy;                Surgeon: Rosetta Posner, DPM;  Location: [facility];                Service: Podiatry;  Laterality: Left; 02/29/2020: IRRIGATION AND DEBRIDEMENT FOOT; Left     Comment:  Procedure: IRRIGATION AND DEBRIDEMENT FOOT;  Surgeon:               Rosetta Posner, DPM;  Location: [facility];  Service:  Podiatry;  Laterality: Left; 02/24/2020: IRRIGATION AND DEBRIDEMENT FOOT; Left     Comment:  Procedure: IRRIGATION AND DEBRIDEMENT FOOT;  Surgeon:               Rosetta Posner, DPM;  Location: [facility];  Service:               Podiatry;  Laterality: Left; No date: KNEE ARTHROSCOPY 02/02/2020: LEFT HEART CATH AND CORS/GRAFTS ANGIOGRAPHY; [name]     Comment:  Procedure: LEFT HEART CATH AND CORS/GRAFTS ANGIOGRAPHY;               Surgeon: Dalia Heading, MD;  Location: [facility] CV              LAB;  Service: Cardiovascular;   Laterality: [name]; 02/25/2020: LOWER EXTREMITY ANGIOGRAPHY; Left     Comment:  Procedure: Lower Extremity Angiography;  Surgeon: Annice Needy, MD;  Location: [facility] CV LAB;  Service:               Cardiovascular;  Laterality: Left; 01/04/2021: LOWER EXTREMITY ANGIOGRAPHY; Left     Comment:  Procedure: LOWER EXTREMITY ANGIOGRAPHY;  Surgeon: Annice Needy, MD;  Location: [facility] LAB;  Service:               Cardiovascular;  Laterality: Left; 05/21/2022: LOWER EXTREMITY ANGIOGRAPHY; Right     Comment:  Procedure: Lower Extremity Angiography;  Surgeon: Annice Needy, MD;  Location: [facility] CV LAB;  Service:               Cardiovascular;  Laterality: Right; 01/10/2021: METATARSAL HEAD EXCISION; Left     Comment:  Procedure: METATARSAL HEAD EXCISION - LEFT 5th;                Surgeon: Rosetta Posner, DPM;  Location: [facility];                Service: Podiatry;  Laterality: Left; 01/24/2016: PERIPHERAL VASCULAR CATHETERIZATION; Right     Comment:  Procedure: Lower Extremity Angiography;  Surgeon:               Renford Dills, MD;  Location: [facility] LAB;                Service: Cardiovascular;  Laterality: Right; 01/25/2016: PERIPHERAL VASCULAR CATHETERIZATION; Right     Comment:  Procedure: Lower Extremity Angiography;  Surgeon:               Renford Dills, MD;  Location: [facility] LAB;                Service: Cardiovascular;  Laterality: Right; 03/18/2023: PORTACATH PLACEMENT; [name]     Comment:  Procedure: INSERTION PORT-A-CATH;  Surgeon:               Carolan Shiver, MD;  Location: [facility];  Service:              General;  Laterality: [name]; No date: TOE AMPUTATION No date: TONSILLECTOMY  BMI    Body Mass Index: 44.17 kg/m      Reproductive/Obstetrics negative OB ROS  Anesthesia Physical Anesthesia Plan  ASA: 3  Anesthesia Plan: General    Post-op Pain Management:    Induction: Intravenous  PONV Risk Score and Plan: Propofol infusion and TIVA  Airway Management Planned: Natural Airway and Nasal Cannula  Additional Equipment:   Intra-op Plan:   Post-operative Plan:   Informed Consent: I have reviewed the patients History and Physical, chart, labs and discussed the procedure including the risks, benefits and alternatives for the proposed anesthesia with the patient or authorized representative who has indicated his/her understanding and acceptance.     Dental Advisory Given  Plan Discussed with: Anesthesiologist, CRNA and Surgeon  Anesthesia Plan Comments: (Wife consented for risks of anesthesia including but not limited to:  - adverse reactions to medications - risk of airway placement if required - damage to eyes, teeth, lips or other oral mucosa - nerve damage due to positioning  - sore throat or hoarseness - Damage to heart, brain, nerves, lungs, other parts of body or loss of life  She voiced understanding and assent.)       Anesthesia Quick Evaluation

## 2023-04-22 NOTE — TOC Progression Note (Signed)
Transition of Care (TOC) - Progression Note    Patient Details  Name: [name]. MRN: [id number] Date of Birth: 06-May-1955  Transition of Care [facility]) CM/SW Contact  Truddie Hidden, RN Phone Number: 04/22/2023, 2:54 PM  Clinical Narrative:    Per Gena patient has not used SNF days. [facility] to resubmit auth.         Expected Discharge Plan and Services                                               Social Determinants of Health (SDOH) Interventions SDOH Screenings   Food Insecurity: No Food Insecurity (03/29/2023)  Housing: Low Risk  (03/29/2023)  Transportation Needs: No Transportation Needs (03/29/2023)  Utilities: Not At Risk (03/29/2023)  Depression (PHQ2-9): Low Risk  (11/21/2021)  Financial Resource Strain: Low Risk  (09/28/2022)   [facility], [facility]  Tobacco Use: Medium Risk (04/10/2023)    Readmission Risk Interventions    02/13/2022    4:57 PM 09/29/2021   11:12 AM  Readmission Risk Prevention Plan  Transportation Screening Complete Complete  PCP or Specialist Appt within 3-5 Days  Complete  HRI or Home Care Consult  Complete  Social Work Consult for Recovery Care Planning/Counseling  Complete  Palliative Care Screening  Not Applicable  Medication Review [employer]) Complete Complete  PCP or Specialist appointment within 3-5 days of discharge Complete   HRI or Home Care Consult Complete   SW Recovery Care/Counseling Consult Complete   Palliative Care Screening Not Applicable   Skilled Nursing Facility Not Applicable

## 2023-04-22 NOTE — Progress Notes (Signed)
Around 0010H pt's wife called saying someone had just called her using the unit's phone. Pt's wife verbalized, " someone was calling me at this number [phone] at 2A nurse desk". I asked her how long ago was it and she told me "I just hung up". I was sitting at the nurses station for about 15 minutes and no one was calling her. I told the pt's wife about this but she was still upset. As per pt's wife, she doesn't want to be disturbed because she needs to wake up early tomorrow morning.

## 2023-04-22 NOTE — Progress Notes (Addendum)
Progress Note   Patient: [name]. WJX:[id number] DOB: [birth year] DOA: 03/28/2023     25 DOS: the patient was seen and examined on 04/22/2023   Brief hospital course:  [name]. is a 67 y.o. male with medical history significant of atrial fibrillation, asthma, congestive heart failure, COPD, coronary artery disease, depression, diabetes mellitus, gout, hyperlipidemia, hypertension, hypogonadism in male, morbid obesity, peripheral vascular disease, OSA on CPAP at night, esophageal cancer status post chemo and G-tube placement.  Patient currently being managed for hyperkalemia, severe dehydration and AKI and acute metabolic encephalopathy. MRI of the brain was negative, ammonia level was also negative. Patient was treated with [facility] for hypercalcemia.  Patient was also started on oral amiodarone for atrial flutter with RVR.  Followed by cardiology. 12/21.  Patient developed urinary retention again, Foley catheter was anchored after repeat I&O cath. 12/22.  Potassium still high, continued on Lokelma, also added a dose of Lasix, give a dose of albumin follow-up blood pressure.         Principal Problem:   Hyperkalemia Active Problems:   Delirium   Acute kidney injury (HCC)   Malnutrition of moderate degree   Acute hyperkalemia   Diarrhea   Perinephric fluid collection   Persistent atrial fibrillation (HCC)   Typical atrial flutter (HCC)   Diarrhea of infectious origin   Atrial flutter with rapid ventricular response (HCC)   Malignant neoplasm of lower third of esophagus [facility])   Palliative care encounter     Assessment and Plan: Acute kidney injury secondary to dehydration Hyperkalemia. Hyponatremia. This is secondary to dehydration from diarrhea.  Renal function had improved after fluids.  Patient has persistent hyperkalemia, currently on scheduled Lokelma.  Sodium level low but stable.  Discussed with pharmacy, there is no other potassium binder to choose  for tube feeding.  Dietitian consulted and case discussed   Delirium secondary to acute metabolic encephalopathy.-Improving Klebsiella UTI Patient urine culture was positive for Klebsiella at time of admission, completed antibiotics.  Currently no symptoms.   Acute diarrhea secondary to C. difficile colitis. Patient is status post 10 days of Dificid followed with 10-day course of vancomycin.   Status post flexible sigmoidoscopy on 12/11.  Normal-appearing mucosa.  Biopsies taken.  Diarrhea has been improving as of 12/12 Diarrhea improving, but the patient still has some loose stools from tube feeding.   Continue as needed Imodium   Possible right perinephric hematoma --Described as possible abscess by radiology but per secure chat communication w/ Dr. Apolinar Junes on 12/9, this is a chornic complex hematoma dating back over a year, nothing that has been drainable thus far, possible underlying renal mass, and in light of significant comorbidities and as he is asymptomatic inpatient evaluation has been deferred Continue to follow-up with urologist as an outpatient  Chronic persistent atrial flutter with rvr Intermittent hypotension. Cardiology now following. Remains in rvr, hemodynamically stable , Heart rate still remain at the 110 to 120, cardiology is considering cardioversion on Monday. Continue beta-blocker and diltiazem.     Urinary retention secondary to benign prostate hypertrophy. Patient developed significant urinary retention since last night with residual over 500 mL, patient had In-N-Out cath multiple times, Foley catheter was anchored again.   Chronic diastolic congestive heart failure --Recent EF of 55% on echo obtained on November 2024 No exacerbation.   COPD --Continue as needed nebulization    Coronary artery disease,  --Continue aspirin therapy   Diabetes mellitus type 2 with hyperglycemia Continue Semglee 30  units nightly (previous dose 35 units)    Hypothyroidism Continue levothyroxine     Moderate protein calorie malnutrition. Nutrition Problem: Moderate Malnutrition Etiology: chronic illness (esopahgeal mass, likely malginant) Signs/Symptoms: mild fat depletion, mild muscle depletion Interventions: Tube feeding   Peripheral vascular disease S/p bilateral BKAs --Continue aspirin  --Patient is allergic to statin therapy   OSA on CPAP at night,  --CPAP at night   Esophageal cancer status post chemo and G-tube placement. Continue bolus tube feeding.   Subjective:  Patient feels well, still has significant loose stools.  No abdominal pain no nausea vomiting.   Physical Exam: General exam: Appears calm and comfortable  Respiratory system: Clear to auscultation. Respiratory effort normal. Cardiovascular system: Appear regular with tachycardia. No JVD, murmurs, rubs, gallops or clicks.  Gastrointestinal system: Abdomen is nondistended, soft and nontender. No organomegaly or masses felt. Normal bowel sounds heard. Central nervous system: Alert and oriented x2. No focal neurological deficits. Extremities: Bilateral BKA. Skin: No rashes, lesions or ulcers Psychiatry:  Mood & affect appropriate.      Data Reviewed: I have reviewed patient's MRI showing no acute intracranial pathology    Latest Ref Rng & Units 04/22/2023   10:05 AM 04/21/2023    4:48 AM 04/20/2023    3:56 AM  BMP  Glucose 70 - 99 mg/dL 409  811  914   BUN 8 - 23 mg/dL 35  34  31   Creatinine 0.61 - 1.24 mg/dL 7.82  9.56  2.13   Sodium 135 - 145 mmol/L 134  134  134   Potassium 3.5 - 5.1 mmol/L 4.9  5.2  5.2   Chloride 98 - 111 mmol/L 99  99  98   CO2 22 - 32 mmol/L 26  25  28    Calcium 8.9 - 10.3 mg/dL 8.9  9.3  9.4        Latest Ref Rng & Units 04/22/2023   10:05 AM 04/20/2023    3:56 AM 04/19/2023    5:31 AM  CBC  WBC 4.0 - 10.5 K/uL 10.2  10.8  12.4   Hemoglobin 13.0 - 17.0 g/dL 08.6  57.8  46.9   Hematocrit 39.0 - 52.0 % 34.0  37.2  36.9    Platelets 150 - 400 K/uL 256  294  287       Family Communication: Discussed with patient's wife present at bedside   Disposition: Status is: Inpatient Remains inpatient appropriate because: Severity of disease, inpatient procedure.      Time spent: 45 minutes  Author: Loyce Dys, MD 04/22/2023 3:09 PM  For on call review [email or web address].

## 2023-04-22 NOTE — Plan of Care (Signed)
  Problem: Education: Goal: Ability to describe self-care measures that may prevent or decrease complications (Diabetes Survival Skills Education) will improve Outcome: Progressing   Problem: Coping: Goal: Ability to adjust to condition or change in health will improve Outcome: Progressing   Problem: Fluid Volume: Goal: Ability to maintain a balanced intake and output will improve Outcome: Progressing   Problem: Health Behavior/Discharge Planning: Goal: Ability to identify and utilize available resources and services will improve Outcome: Progressing   Problem: Metabolic: Goal: Ability to maintain appropriate glucose levels will improve Outcome: Progressing   Problem: Nutritional: Goal: Maintenance of adequate nutrition will improve Outcome: Progressing Goal: Progress toward achieving an optimal weight will improve Outcome: Progressing   Problem: Skin Integrity: Goal: Risk for impaired skin integrity will decrease Outcome: Progressing   Problem: Education: Goal: Knowledge of General Education information will improve Description: Including pain rating scale, medication(s)/side effects and non-pharmacologic comfort measures Outcome: Progressing   Problem: Health Behavior/Discharge Planning: Goal: Ability to manage health-related needs will improve Outcome: Progressing   Problem: Clinical Measurements: Goal: Ability to maintain clinical measurements within normal limits will improve Outcome: Progressing Goal: Will remain free from infection Outcome: Progressing Goal: Diagnostic test results will improve Outcome: Progressing Goal: Respiratory complications will improve Outcome: Progressing Goal: Cardiovascular complication will be avoided Outcome: Progressing   Problem: Activity: Goal: Risk for activity intolerance will decrease Outcome: Progressing   Problem: Nutrition: Goal: Adequate nutrition will be maintained Outcome: Progressing   Problem: Coping: Goal:  Level of anxiety will decrease Outcome: Progressing   Problem: Elimination: Goal: Will not experience complications related to bowel motility Outcome: Progressing Goal: Will not experience complications related to urinary retention Outcome: Progressing   Problem: Pain Management: Goal: General experience of comfort will improve Outcome: Progressing   Problem: Safety: Goal: Ability to remain free from injury will improve Outcome: Progressing   Problem: Skin Integrity: Goal: Risk for impaired skin integrity will decrease Outcome: Progressing

## 2023-04-22 NOTE — Progress Notes (Signed)
Progress Note  Patient Name: [name]. Date of Encounter: 04/22/2023  Primary Cardiologist: Kirke Corin  Subjective   Confusion much worse this morning leading to delay in DCCV. Patient denies chest pain, dyspnea, palpitations, dizziness, presyncope, or syncope.     Inpatient Medications    Scheduled Meds:  amiodarone  400 mg Per Tube BID   apixaban  5 mg Per Tube BID   ascorbic acid  500 mg Per Tube Daily   aspirin  81 mg Per Tube Daily   Chlorhexidine Gluconate Cloth  6 each Topical Daily   cholecalciferol  2,000 Units Per Tube Daily   clonazePAM  0.5 mg Per Tube BID   cyanocobalamin  10,000 mcg Per Tube Daily   ezetimibe  10 mg Per Tube Daily   feeding supplement (GLUCERNA 1.5 CAL)  237 mL Per Tube 6 X Daily   ferrous sulfate  300 mg Per Tube Daily   fiber supplement (BANATROL TF)  60 mL Per Tube TID   free water  140 mL Per Tube 6 X Daily   furosemide  20 mg Per Tube Daily   insulin aspart  0-5 Units Subcutaneous QHS   insulin aspart  0-9 Units Subcutaneous TID WC   insulin glargine-yfgn  30 Units Subcutaneous QHS   levothyroxine  25 mcg Per Tube Q0600   multivitamin with minerals  1 tablet Per Tube Daily   pramipexole  2 mg Per Tube QHS   primidone  250 mg Per Tube BID   sodium zirconium cyclosilicate  10 g Per Tube Daily   Continuous Infusions:  sodium chloride 20 mL/hr at 04/21/23 2247   PRN Meds: acetaminophen, haloperidol, ipratropium-albuterol, loperamide HCl, nitroGLYCERIN, ondansetron, mouth rinse   Vital Signs    Vitals:   04/21/23 2000 04/22/23 0000 04/22/23 0346 04/22/23 0400  BP:      Pulse: 89     Resp:      Temp: 98.2 F (36.8 C) 98.1 F (36.7 C)  98 F (36.7 C)  TempSrc: Oral Oral  Oral  SpO2: 98%     Weight:   120.4 kg   Height:        Intake/Output Summary (Last 24 hours) at 04/22/2023 0746 Last data filed at 04/22/2023 0500 Gross per 24 hour  Intake 124.01 ml  Output 1450 ml  Net -1325.99 ml   Filed Weights   04/20/23  0500 04/21/23 0344 04/22/23 0346  Weight: 119.5 kg 119.7 kg 120.4 kg    Telemetry    Atrial flutter with RVR with ventricular rates in the 1-teens bpm - Personally Reviewed  ECG    No new tracings - Personally Reviewed  Physical Exam   GEN: No acute distress.   Neck: No JVD. Cardiac: Tachycardic, no murmurs, rubs, or gallops.  Respiratory: Clear to auscultation bilaterally.  GI: Soft, nontender, non-distended.   MS: Status post bilateral BKA. Neuro:  Alert and oriented x 1 (person only); Nonfocal.  Psych: Normal affect.  Labs    Chemistry Recent Labs  Lab 04/17/23 [phone] 04/18/23 0829 04/19/23 0531 04/20/23 0356 04/21/23 0448  NA 135  --  134* 134* 134*  K 5.2*   < > 5.5* 5.2* 5.2*  CL 103  --  99 98 99  CO2 24  --  25 28 25   GLUCOSE 241*  --  254* 148* 180*  BUN 38*  --  33* 31* 34*  CREATININE 1.11  --  0.92 0.92 1.04  CALCIUM 8.9  --  9.1 9.4 9.3  PROT 6.6  --   --   --   --   ALBUMIN 2.4*  --   --   --   --   AST 17  --   --   --   --   ALT 34  --   --   --   --   ALKPHOS 129*  --   --   --   --   BILITOT <0.2  --   --   --   --   GFRNONAA >60  --  >60 >60 >60  ANIONGAP 8  --  10 8 10    < > = values in this interval not displayed.     Hematology Recent Labs  Lab 04/17/23 0454 04/19/23 0531 04/20/23 0356  WBC 9.8 12.4* 10.8*  RBC 4.31 4.07* 4.03*  HGB 12.5* 12.0* 11.7*  HCT 39.5 36.9* 37.2*  MCV 91.6 90.7 92.3  MCH 29.0 29.5 29.0  MCHC 31.6 32.5 31.5  RDW 14.4 14.3 14.4  PLT 282 287 294    Cardiac EnzymesNo results for input(s): "TROPONINI" in the last 168 hours. No results for input(s): "TROPIPOC" in the last 168 hours.   BNPNo results for input(s): "BNP", "PROBNP" in the last 168 hours.   DDimer No results for input(s): "DDIMER" in the last 168 hours.   Radiology    No results found.  Cardiac Studies   2D echo 03/17/2023: 1. Left ventricular ejection fraction, by estimation, is 55 %. The left  ventricle has normal function. The  left ventricle demonstrates regional  wall motion abnormalities (mild hypokinesis of the basal inferior wall).  There is mild left ventricular  hypertrophy. Left ventricular diastolic parameters are consistent with  Grade I diastolic dysfunction (impaired relaxation). The average left  ventricular global longitudinal strain is -12.0 %.   2. Right ventricular systolic function is normal. The right ventricular  size is normal. Tricuspid regurgitation signal is inadequate for assessing  PA pressure.   3. The mitral valve is normal in structure. No evidence of mitral valve  regurgitation. No evidence of mitral stenosis.   4. The aortic valve is tricuspid. There is mild calcification of the  aortic valve. Aortic valve regurgitation is not visualized. Aortic valve  sclerosis/calcification is present, without any evidence of aortic  stenosis.   5. The inferior vena cava is normal in size with greater than 50%  respiratory variability, suggesting right atrial pressure of 3 mmHg.   Patient Profile     67 y.o. male with history of CAD status post CABG in 2006, A-fib status post prior DCCV, esophageal cancer status postchemotherapy and G-tube placement, PAD status post bilateral BKA, DM2, HTN, and obesity who we are seeing for atrial flutter with RVR.  Assessment & Plan    Atrial flutter with RVR - History of paroxysmal Afib with multiple cardioversions - CHA2DS2-VASc of at least 5  - Continue apixaban 5 mg BID - Remains in atrial flutter with continued elevated rates 1-teens bpm - IV digoxin has not been effective   - Short-acting metoprolol and diltiazem were stopped on 12/22 due to relative hypotension, though this had been stable for a while - Initially plans were for DCCV today, however his mental status is significantly worse today when compared to how he has been over the past 3 days of rounding. He states he is in a [employer] ge[employer] in [place], [place] that his daughter owns. He states it is  summer time and  cannot recall the year. He is alert to person. Previously on rounds, he was alert and oriented x 3. He continues to report he is in a [employer] ge[employer] and that "we are wrong"  - Given acute change in mental state, we will need to cancel DCCV today until his AMS has been further evaluated by IM -May need to resume short acting diltiazem and metoprolol tartrate given G-tube, as BP allows.  Not able to uptitrate due to soft blood pressure -Continue amiodarone 200 mg twice daily through 12/30 with 200 mg daily thereafter - Not a candidate for TEE given esophageal cancer - Cardioversion without TEE can be performed on December 23 as long as OAC does not get interrupted  - Two-person verbal consent obtained 12/21 with patient's spouse over the phone - Amiodarone drip was not effective in rate control   Acute metabolic encephalopathy - Worse - No focal deficit  - Evaluation per IM - Esophageal cancer s/p G tube - C diff with profuse diarrhea - AMS with agitation, requires safety observations - Management per IM - MRI of the brain without acute process   CAD s/p CABG/PAD s/p BKA - Denies chest pain - Continue ASA and ezetimibe - Statin intolerance  Hyperkalemia - Stable - Per primary service   Normocytic anemia - Hgb stable        For questions or updates, please contact [facility] Please consult [email or web address] for contact info under Cardiology/STEMI.    Signed, Eula Listen, PA-C [facility] Pager: [phone] 04/22/2023, 7:46 AM

## 2023-04-22 NOTE — Progress Notes (Signed)
Patient continues to be anxious and agitated throughout the shift. PRN Haldol given as ordered. Remains continue to redirect. Continue to redirect, decrease stimulation and position for comfort. Denies pain or discomfort. Bed in low position, bed alarm on and call in reach. Remains NPO for scheduled procedure.

## 2023-04-22 NOTE — Plan of Care (Signed)
  Problem: Coping: Goal: Ability to adjust to condition or change in health will improve Outcome: Progressing   Problem: Fluid Volume: Goal: Ability to maintain a balanced intake and output will improve Outcome: Progressing   Problem: Health Behavior/Discharge Planning: Goal: Ability to manage health-related needs will improve Outcome: Progressing   

## 2023-04-22 NOTE — Consult Note (Signed)
PHARMACY CONSULT NOTE - ELECTROLYTES  Pharmacy Consult for Electrolyte Monitoring and Replacement   Recent Labs: Height: 5\' 5"  (165.1 cm) (Bilateral BKAs) Weight: 120.4 kg (265 lb 6.9 oz) IBW/kg (Calculated) : 61.5 Estimated Creatinine Clearance: 81.8 mL/min (by C-G formula based on SCr of 1.04 mg/dL).  Potassium (mmol/L)  Date Value  04/21/2023 5.2 (H)  07/12/2011 4.4   Magnesium (mg/dL)  Date Value  09/81/1914 2.3   Calcium (mg/dL)  Date Value  78/29/5621 9.3   Calcium, Total (mg/dL)  Date Value  30/86/5784 8.8   Albumin (g/dL)  Date Value  69/62/9528 2.4 (L)   Phosphorus (mg/dL)  Date Value  41/32/4401 4.4   Sodium (mmol/L)  Date Value  04/21/2023 134 (L)  07/12/2011 141   Assessment  Alec A Brunelli Montez Hageman. is a 67 y.o. male presenting with hyperkalemia, severe dehydration and AKI. PMH significant for atrial fibrillation, asthma, congestive heart failure, COPD, coronary artery disease, depression, diabetes mellitus, gout, hyperlipidemia, hypertension, hypogonadism in male, morbid obesity, peripheral vascular disease, OSA on CPAP at night, esophageal cancer status post chemo and G-tube placement. Pharmacy has been consulted to monitor and replace electrolytes.Pt is on amio, lasix 20 mg daily via tube,   Diet: Tube feeds MIVF: Free water 140 mL per tube 6x/day  Goal of Therapy: K+ > 4 Mg > 2  Plan:  Continue Lokelma 10 g daily. Lasix was discontinued per provider. Not sure what potassium content is of tube feeds. May consider low potassium formulation F/u with labs as deem appropriate.   Thank you for allowing pharmacy to be a part of this patient's care.  Ronnald Ramp, PharmD, BCPS 04/22/2023 9:06 AM

## 2023-04-23 ENCOUNTER — Encounter
Admission: EM | Disposition: A | Discharge status: Skilled Nursing Facility | Payer: Self-pay | Source: Home / Self Care | Attending: Obstetrics and Gynecology

## 2023-04-23 ENCOUNTER — Inpatient Hospital Stay: Payer: Medicare Other | Admitting: Anesthesiology

## 2023-04-23 DIAGNOSIS — I4892 Unspecified atrial flutter: Secondary | ICD-10-CM | POA: Diagnosis not present

## 2023-04-23 DIAGNOSIS — E875 Hyperkalemia: Secondary | ICD-10-CM | POA: Diagnosis not present

## 2023-04-23 DIAGNOSIS — R0602 Shortness of breath: Secondary | ICD-10-CM

## 2023-04-23 HISTORY — PX: CARDIOVERSION: SHX1299

## 2023-04-23 LAB — BASIC METABOLIC PANEL
Anion gap: 10 (ref 5–15)
BUN: 33 mg/dL — ABNORMAL HIGH (ref 8–23)
CO2: 26 mmol/L (ref 22–32)
Calcium: 8.9 mg/dL (ref 8.9–10.3)
Chloride: 96 mmol/L — ABNORMAL LOW (ref 98–111)
Creatinine, Ser: 1.04 mg/dL (ref 0.61–1.24)
GFR, Estimated: >60 mL/min (ref >60)
Glucose, Bld: 179 mg/dL — ABNORMAL HIGH (ref 70–99)
Potassium: 4.7 mmol/L (ref 3.5–5.1)
Sodium: 132 mmol/L — ABNORMAL LOW (ref 135–145)

## 2023-04-23 LAB — CBC WITH DIFFERENTIAL/PLATELET
Abs Immature Granulocytes: 0.06 10*3/uL (ref 0.00–0.07)
Basophils Absolute: 0.1 10*3/uL (ref 0.0–0.1)
Basophils Relative: 1 %
Eosinophils Absolute: 0.3 10*3/uL (ref 0.0–0.5)
Eosinophils Relative: 3 %
HCT: 33.3 % — ABNORMAL LOW (ref 39.0–52.0)
Hemoglobin: 10.9 g/dL — ABNORMAL LOW (ref 13.0–17.0)
Immature Granulocytes: 1 %
Lymphocytes Relative: 12 %
Lymphs Abs: 1.2 10*3/uL (ref 0.7–4.0)
MCH: 29 pg (ref 26.0–34.0)
MCHC: 32.7 g/dL (ref 30.0–36.0)
MCV: 88.6 fL (ref 80.0–100.0)
Monocytes Absolute: 0.5 10*3/uL (ref 0.1–1.0)
Monocytes Relative: 5 %
Neutro Abs: 7.9 10*3/uL — ABNORMAL HIGH (ref 1.7–7.7)
Neutrophils Relative %: 78 %
Platelets: 264 10*3/uL (ref 150–400)
RBC: 3.76 MIL/uL — ABNORMAL LOW (ref 4.22–5.81)
RDW: 14.2 % (ref 11.5–15.5)
WBC: 10 10*3/uL (ref 4.0–10.5)
nRBC: 0 % (ref 0.0–0.2)

## 2023-04-23 LAB — GLUCOSE, CAPILLARY
Glucose-Capillary: 198 mg/dL — ABNORMAL HIGH (ref 70–99)
Glucose-Capillary: 152 mg/dL — ABNORMAL HIGH (ref 70–99)
Glucose-Capillary: 145 mg/dL — ABNORMAL HIGH (ref 70–99)
Glucose-Capillary: 158 mg/dL — ABNORMAL HIGH (ref 70–99)

## 2023-04-23 SURGERY — CARDIOVERSION
Anesthesia: General

## 2023-04-23 MED ORDER — PROPOFOL 10 MG/ML IV BOLUS
INTRAVENOUS | Status: DC | PRN
  Administered 2023-04-23: 50 mg via INTRAVENOUS

## 2023-04-23 MED ORDER — RISPERIDONE 0.5 MG PO TABS
0.5000 mg | ORAL_TABLET | Freq: Two times a day (BID) | ORAL | Status: DC | PRN
  Administered 2023-04-24 – 2023-05-03 (×7): 0.5 mg
  Filled 2023-04-23 (×11): qty 1

## 2023-04-23 NOTE — Progress Notes (Signed)
   04/23/23 0736  Assess: MEWS Score  Temp 97.6 F (36.4 C)  BP 115/83  MAP (mmHg) 92  Pulse Rate (!) 118  ECG Heart Rate (!) 118  Resp 17  Level of Consciousness Alert  SpO2 95 %  O2 Device Room Air  Assess: MEWS Score  MEWS Temp 0  MEWS Systolic 0  MEWS Pulse 2  MEWS RR 0  MEWS LOC 0  MEWS Score 2  MEWS Score Color Yellow  Assess: if the MEWS score is Yellow or Red  Were vital signs accurate and taken at a resting state? Yes  Does the patient meet 2 or more of the SIRS criteria? Yes  Does the patient have a confirmed or suspected source of infection? No  MEWS guidelines implemented  Yes, yellow  Treat  MEWS Interventions Considered administering scheduled or prn medications/treatments as ordered  Take Vital Signs  Increase Vital Sign Frequency  Yellow: Q2hr x1, continue Q4hrs until patient remains green for 12hrs  Escalate  MEWS: Escalate Yellow: Discuss with charge nurse and consider notifying provider and/or RRT  Notify: Charge Nurse/RN  Name of Charge Nurse/RN Notified Bary Damyn  Provider Notification  Provider Name/Title Loyce Dys  Date Provider Notified 04/23/23  Time Provider Notified 0740  Method of Notification Page  Notification Reason Other (Comment) (Tachycardia + MEWS)  Provider response No new orders  Date of Provider Response 04/23/23  Time of Provider Response 0745  Assess: SIRS CRITERIA  SIRS Temperature  0  SIRS Respirations  0  SIRS Pulse 1  SIRS WBC 0  SIRS Score Sum  1

## 2023-04-23 NOTE — Progress Notes (Signed)
Progress Note  Patient Name: [name]. Date of Encounter: 04/23/2023  Primary Cardiologist: Kirke Corin  Subjective   The patient is alert today.  There is slight confusion but he seems to be back to baseline.  He knew that he needed cardioversion done and he asked appropriate questions.  Inpatient Medications    Scheduled Meds:  amiodarone  400 mg Per Tube BID   apixaban  5 mg Per Tube BID   ascorbic acid  500 mg Per Tube Daily   aspirin  81 mg Per Tube Daily   Chlorhexidine Gluconate Cloth  6 each Topical Daily   cholecalciferol  2,000 Units Per Tube Daily   clonazePAM  0.5 mg Per Tube BID   cyanocobalamin  10,000 mcg Per Tube Daily   ezetimibe  10 mg Per Tube Daily   feeding supplement (GLUCERNA 1.5 CAL)  237 mL Per Tube 6 X Daily   ferrous sulfate  300 mg Per Tube Daily   fiber supplement (BANATROL TF)  60 mL Per Tube TID   free water  140 mL Per Tube 6 X Daily   furosemide  20 mg Per Tube Daily   insulin aspart  0-5 Units Subcutaneous QHS   insulin aspart  0-9 Units Subcutaneous TID WC   insulin glargine-yfgn  30 Units Subcutaneous QHS   levothyroxine  25 mcg Per Tube Q0600   multivitamin with minerals  1 tablet Per Tube Daily   pramipexole  2 mg Per Tube QHS   primidone  250 mg Per Tube BID   Continuous Infusions:   PRN Meds: acetaminophen, haloperidol, ipratropium-albuterol, loperamide HCl, nitroGLYCERIN, ondansetron, mouth rinse   Vital Signs    Vitals:   04/22/23 2035 04/23/23 0030 04/23/23 0420 04/23/23 0736  BP: 117/81 127/82 113/78 115/83  Pulse: (!) 112 (!) 115 (!) 116 (!) 118  Resp: 18 (!) 21 17 17   Temp: 97.8 F (36.6 C) 97.9 F (36.6 C) 98.9 F (37.2 C) 97.6 F (36.4 C)  TempSrc: Oral Oral Oral Oral  SpO2: 96% 95% 92% 95%  Weight:      Height:        Intake/Output Summary (Last 24 hours) at 04/23/2023 0931 Last data filed at 04/23/2023 1610 Gross per 24 hour  Intake 497 ml  Output 1000 ml  Net -503 ml   Filed Weights    04/20/23 0500 04/21/23 0344 04/22/23 0346  Weight: 119.5 kg 119.7 kg 120.4 kg    Telemetry    Atrial flutter with RVR with ventricular rates in the 1-teens bpm - Personally Reviewed  ECG    No new tracings - Personally Reviewed  Physical Exam   GEN: No acute distress.   Neck: No JVD. Cardiac: Tachycardic, no murmurs, rubs, or gallops.  Respiratory: Clear to auscultation bilaterally.  GI: Soft, nontender, non-distended.   MS: Status post bilateral BKA. Neuro:  Alert and oriented x 1 (person only); Nonfocal.  Psych: Normal affect.  Labs    Chemistry Recent Labs  Lab 04/17/23 [phone] 04/18/23 0829 04/21/23 0448 04/22/23 1005 04/23/23 0452  NA 135   < > 134* 134* 132*  K 5.2*   < > 5.2* 4.9 4.7  CL 103   < > 99 99 96*  CO2 24   < > 25 26 26   GLUCOSE 241*   < > 180* 203* 179*  BUN 38*   < > 34* 35* 33*  CREATININE 1.11   < > 1.04 1.09 1.04  CALCIUM 8.9   < >  9.3 8.9 8.9  PROT 6.6  --   --   --   --   ALBUMIN 2.4*  --   --   --   --   AST 17  --   --   --   --   ALT 34  --   --   --   --   ALKPHOS 129*  --   --   --   --   BILITOT <0.2  --   --   --   --   GFRNONAA >60   < > >60 >60 >60  ANIONGAP 8   < > 10 9 10    < > = values in this interval not displayed.     Hematology Recent Labs  Lab 04/20/23 0356 04/22/23 1005 04/23/23 0452  WBC 10.8* 10.2 10.0  RBC 4.03* 3.81* 3.76*  HGB 11.7* 11.2* 10.9*  HCT 37.2* 34.0* 33.3*  MCV 92.3 89.2 88.6  MCH 29.0 29.4 29.0  MCHC 31.5 32.9 32.7  RDW 14.4 14.5 14.2  PLT 294 256 264    Cardiac EnzymesNo results for input(s): "TROPONINI" in the last 168 hours. No results for input(s): "TROPIPOC" in the last 168 hours.   BNPNo results for input(s): "BNP", "PROBNP" in the last 168 hours.   DDimer No results for input(s): "DDIMER" in the last 168 hours.   Radiology    No results found.  Cardiac Studies   2D echo 03/17/2023: 1. Left ventricular ejection fraction, by estimation, is 55 %. The left  ventricle has  normal function. The left ventricle demonstrates regional  wall motion abnormalities (mild hypokinesis of the basal inferior wall).  There is mild left ventricular  hypertrophy. Left ventricular diastolic parameters are consistent with  Grade I diastolic dysfunction (impaired relaxation). The average left  ventricular global longitudinal strain is -12.0 %.   2. Right ventricular systolic function is normal. The right ventricular  size is normal. Tricuspid regurgitation signal is inadequate for assessing  PA pressure.   3. The mitral valve is normal in structure. No evidence of mitral valve  regurgitation. No evidence of mitral stenosis.   4. The aortic valve is tricuspid. There is mild calcification of the  aortic valve. Aortic valve regurgitation is not visualized. Aortic valve  sclerosis/calcification is present, without any evidence of aortic  stenosis.   5. The inferior vena cava is normal in size with greater than 50%  respiratory variability, suggesting right atrial pressure of 3 mmHg.   Patient Profile     67 y.o. male with history of CAD status post CABG in 2006, A-fib status post prior DCCV, esophageal cancer status postchemotherapy and G-tube placement, PAD status post bilateral BKA, DM2, HTN, and obesity who we are seeing for atrial flutter with RVR.  Assessment & Plan    Atrial flutter with RVR - History of paroxysmal Afib with multiple cardioversions - CHA2DS2-VASc of at least 5  - Continue apixaban 5 mg BID - Remains in atrial flutter with continued elevated rates 1-teens bpm - IV digoxin has not been effective   - Short-acting metoprolol and diltiazem were stopped on 12/22 due to relative hypotension, though this had been stable for a while -Cardioversion was canceled yesterday due to mental status changes.  However, the patient seems to be oriented today and back to baseline.  We will proceed with cardioversion today.  I discussed this with the patient and also called  his wife and updated her. - Two-person verbal consent obtained  12/21 with patient's spouse over the phone  Acute metabolic encephalopathy -Significantly improved today and he seems to be back to baseline. - MRI of the brain without acute process   CAD s/p CABG/PAD s/p BKA - Denies chest pain -On ezetimibe - Statin intolerance Will discontinue aspirin given that he is on anticoagulation with Eliquis.  Hyperkalemia - Stable - Per primary service   Normocytic anemia - Hgb stable  Esophageal cancer: Has not received treatment yet due to multiple health issues.        For questions or updates, please contact [facility] Please consult [email or web address] for contact info under Cardiology/STEMI.    Signed, Lorine Bears, MD [facility] 04/23/2023, 9:31 AM

## 2023-04-23 NOTE — Anesthesia Preprocedure Evaluation (Addendum)
Anesthesia Evaluation  Patient identified by MRN, date of birth, ID band Patient confused    Reviewed: Allergy & Precautions, NPO status , Patient's Chart, lab work & pertinent test results  Airway Mallampati: III  TM Distance: <3 FB Neck ROM: full    Dental  (+) Chipped, Poor Dentition, Missing   Pulmonary asthma , sleep apnea , COPD, former smoker   Pulmonary exam normal        Cardiovascular hypertension, + CAD, + Past MI, + Peripheral Vascular Disease and +CHF  + dysrhythmias Atrial Fibrillation      Neuro/Psych  PSYCHIATRIC DISORDERS       Neuromuscular disease    GI/Hepatic negative GI ROS, Neg liver ROS,,,  Endo/Other  diabetesHypothyroidism    Renal/GU Renal disease  negative genitourinary   Musculoskeletal   Abdominal   Peds  Hematology negative hematology ROS (+)   Anesthesia Other Findings Past Medical History: No date: Arrhythmia     Comment:  atrial fibrillation No date: Asthma No date: CHF (congestive heart failure) (HCC) No date: COPD (chronic obstructive pulmonary disease) (HCC) No date: Coronary artery disease No date: Depression No date: Diabetes mellitus without complication (HCC) No date: Gout No date: History anabolic steroid use No date: Hyperlipidemia No date: Hypertension No date: Hypogonadism in male No date: MI (myocardial infarction) (HCC) No date: Morbid obesity (HCC) No date: Myocardial infarction (HCC) No date: Peripheral vascular disease (HCC) No date: Perirectal abscess No date: Pleurisy No date: Sleep apnea     Comment:  CPAP at night, no oxygen No date: Varicella  Past Surgical History: No date: ABDOMINAL AORTIC ANEURYSM REPAIR 01/10/2021: ACHILLES TENDON SURGERY; Left     Comment:  Procedure: ACHILLES LENGTHENING/KIDNER;  Surgeon: Rosetta Posner, DPM;  Location: [facility];  Service: Podiatry;                Laterality: Left; 10/03/2021: AMPUTATION;  Left     Comment:  Procedure: AMPUTATION BELOW KNEE;  Surgeon: Renford Dills, MD;  Location: [facility];  Service: Vascular;                Laterality: Left; 05/24/2022: AMPUTATION; Right     Comment:  Procedure: AMPUTATION BELOW KNEE;  Surgeon: Annice Needy, MD;  Location: [facility];  Service: General;                Laterality: Right; 02/10/2016: AMPUTATION TOE; Right     Comment:  Procedure: AMPUTATION TOE 3RD TOE;  Surgeon: Gwyneth Revels, DPM;  Location: [facility];  Service: Podiatry;                Laterality: Right; 02/24/2020: AMPUTATION TOE; Left     Comment:  Procedure: AMPUTATION TOE;  Surgeon: Rosetta Posner, DPM;              Location: [facility];  Service: Podiatry;  Laterality:               Left; 02/29/2020: APPLICATION OF WOUND VAC; Left     Comment:  Procedure: APPLICATION OF WOUND VAC;  Surgeon: Rosetta Posner, DPM;  Location:  [facility];  Service: Podiatry;                Laterality: Left; 03/15/2023: BIOPSY     Comment:  Procedure: BIOPSY;  Surgeon: Wyline Mood, MD;  Location:              [facility] ENDOSCOPY;  Service: Gastroenterology;; 04/10/2023: BIOPSY     Comment:  Procedure: BIOPSY;  Surgeon: Norma Fredrickson, Boykin Nearing, MD;                Location: [facility] ENDOSCOPY;  Service: Gastroenterology;; No date: CARDIAC CATHETERIZATION 02/13/2022: CARDIOVERSION; [name]     Comment:  Procedure: CARDIOVERSION;  Surgeon: Lamar Blinks,               MD;  Location: [facility];  Service: Cardiovascular;                Laterality: [name]; 11/18/2015: COLONOSCOPY WITH PROPOFOL; [name]     Comment:  Procedure: COLONOSCOPY WITH PROPOFOL;  Surgeon: Scot Jun, MD;  Location: [facility] ENDOSCOPY;  Service:               Endoscopy;  Laterality: [name]; No date: CORONARY ARTERY BYPASS GRAFT 02/02/2020: CORONARY STENT INTERVENTION; [name]     Comment:  Procedure: CORONARY STENT INTERVENTION;  Surgeon:               Marcina Millard, MD;   Location: [facility] INVASIVE CV               LAB;  Service: Cardiovascular;  Laterality: [name]; 03/15/2023: ESOPHAGOGASTRODUODENOSCOPY (EGD) WITH PROPOFOL; [name]     Comment:  Procedure: ESOPHAGOGASTRODUODENOSCOPY (EGD) WITH               PROPOFOL;  Surgeon: Wyline Mood, MD;  Location: [facility]               ENDOSCOPY;  Service: Gastroenterology;  Laterality: [name]; 04/10/2023: FLEXIBLE SIGMOIDOSCOPY; [name]     Comment:  Procedure: FLEXIBLE SIGMOIDOSCOPY;  Surgeon: [place],               Boykin Nearing, MD;  Location: [facility] ENDOSCOPY;  Service:               Gastroenterology;  Laterality: [name]; 03/18/2023: GASTROSTOMY; [name]     Comment:  Procedure: INSERTION OF GASTROSTOMY TUBE;  Surgeon:               Carolan Shiver, MD;  Location: [facility] OR[facility];  Service:              General;  Laterality: [name]; 03/15/2023: IMPACTION REMOVAL     Comment:  Procedure: IMPACTION REMOVAL;  Surgeon: Wyline Mood, MD;              Location: [facility] ENDOSCOPY;  Service: Gastroenterology;; 08/07/2021: INCISION AND DRAINAGE; Left     Comment:  Procedure: INCISION AND DRAINAGE-Partial Calcanectomy;                Surgeon: Rosetta Posner, DPM;  Location: [facility];                Service: Podiatry;  Laterality: Left; 02/29/2020: IRRIGATION AND DEBRIDEMENT FOOT; Left     Comment:  Procedure: IRRIGATION AND DEBRIDEMENT FOOT;  Surgeon:               Rosetta Posner, DPM;  Location: [facility];  Service:  Podiatry;  Laterality: Left; 02/24/2020: IRRIGATION AND DEBRIDEMENT FOOT; Left     Comment:  Procedure: IRRIGATION AND DEBRIDEMENT FOOT;  Surgeon:               Rosetta Posner, DPM;  Location: [facility];  Service:               Podiatry;  Laterality: Left; No date: KNEE ARTHROSCOPY 02/02/2020: LEFT HEART CATH AND CORS/GRAFTS ANGIOGRAPHY; [name]     Comment:  Procedure: LEFT HEART CATH AND CORS/GRAFTS ANGIOGRAPHY;               Surgeon: Dalia Heading, MD;  Location: [facility] CV              LAB;  Service: Cardiovascular;   Laterality: [name]; 02/25/2020: LOWER EXTREMITY ANGIOGRAPHY; Left     Comment:  Procedure: Lower Extremity Angiography;  Surgeon: Annice Needy, MD;  Location: [facility] CV LAB;  Service:               Cardiovascular;  Laterality: Left; 01/04/2021: LOWER EXTREMITY ANGIOGRAPHY; Left     Comment:  Procedure: LOWER EXTREMITY ANGIOGRAPHY;  Surgeon: Annice Needy, MD;  Location: [facility] LAB;  Service:               Cardiovascular;  Laterality: Left; 05/21/2022: LOWER EXTREMITY ANGIOGRAPHY; Right     Comment:  Procedure: Lower Extremity Angiography;  Surgeon: Annice Needy, MD;  Location: [facility] CV LAB;  Service:               Cardiovascular;  Laterality: Right; 01/10/2021: METATARSAL HEAD EXCISION; Left     Comment:  Procedure: METATARSAL HEAD EXCISION - LEFT 5th;                Surgeon: Rosetta Posner, DPM;  Location: [facility];                Service: Podiatry;  Laterality: Left; 01/24/2016: PERIPHERAL VASCULAR CATHETERIZATION; Right     Comment:  Procedure: Lower Extremity Angiography;  Surgeon:               Renford Dills, MD;  Location: [facility] LAB;                Service: Cardiovascular;  Laterality: Right; 01/25/2016: PERIPHERAL VASCULAR CATHETERIZATION; Right     Comment:  Procedure: Lower Extremity Angiography;  Surgeon:               Renford Dills, MD;  Location: [facility] LAB;                Service: Cardiovascular;  Laterality: Right; 03/18/2023: PORTACATH PLACEMENT; [name]     Comment:  Procedure: INSERTION PORT-A-CATH;  Surgeon:               Carolan Shiver, MD;  Location: [facility];  Service:              General;  Laterality: [name]; No date: TOE AMPUTATION No date: TONSILLECTOMY  BMI    Body Mass Index: 44.17 kg/m      Reproductive/Obstetrics negative OB ROS  Anesthesia Physical Anesthesia Plan  ASA: 3  Anesthesia Plan: General    Post-op Pain Management:    Induction: Intravenous  PONV Risk Score and Plan: Propofol infusion and TIVA  Airway Management Planned: Natural Airway and Nasal Cannula  Additional Equipment:   Intra-op Plan:   Post-operative Plan:   Informed Consent: I have reviewed the patients History and Physical, chart, labs and discussed the procedure including the risks, benefits and alternatives for the proposed anesthesia with the patient or authorized representative who has indicated his/her understanding and acceptance.     Dental Advisory Given  Plan Discussed with: Anesthesiologist, CRNA and Surgeon  Anesthesia Plan Comments: (Wife consented for risks of anesthesia including but not limited to:  - adverse reactions to medications - risk of airway placement if required - damage to eyes, teeth, lips or other oral mucosa - nerve damage due to positioning  - sore throat or hoarseness - Damage to heart, brain, nerves, lungs, other parts of body or loss of life  She voiced understanding and assent.)       Anesthesia Quick Evaluation

## 2023-04-23 NOTE — Transfer of Care (Signed)
Immediate Anesthesia Transfer of Care Note  Patient: [name].  Procedure(s) Performed: CARDIOVERSION  Patient Location: [facility]  Anesthesia Type:General  Level of Consciousness: sedated  Airway & Oxygen Therapy: Patient Spontanous Breathing  Post-op Assessment: Report given to RN and Post -op Vital signs reviewed and stable  Post vital signs: Reviewed and stable  Last Vitals:  Vitals Value Taken Time  BP 99/72 04/23/23 1406  Temp    Pulse 85 04/23/23 1408  Resp 19 04/23/23 1408  SpO2 93 % 04/23/23 1408    Last Pain:  Vitals:   04/23/23 1340  TempSrc: Oral  PainSc: 0-No pain      Patients Stated Pain Goal: 0 (04/19/23 2100)  Complications: No notable events documented.

## 2023-04-23 NOTE — Plan of Care (Signed)
  Problem: Education: Goal: Ability to describe self-care measures that may prevent or decrease complications (Diabetes Survival Skills Education) will improve Outcome: Progressing   Problem: Coping: Goal: Ability to adjust to condition or change in health will improve Outcome: Progressing   Problem: Health Behavior/Discharge Planning: Goal: Ability to identify and utilize available resources and services will improve Outcome: Progressing

## 2023-04-23 NOTE — CV Procedure (Signed)
Cardioversion procedure note For atrial flutter with RVR  Procedure Details:  Consent: Risks of procedure as well as the alternatives and risks of each were explained to the (patient/caregiver).  Consent for procedure obtained.  Time Out: Verified patient identification, verified procedure, site/side was marked, verified correct patient position, special equipment/implants available, medications/allergies/relevent history reviewed, required imaging and test results available.  Performed  Patient placed on cardiac monitor, pulse oximetry, supplemental oxygen as necessary.   Sedation given: propofol IV, Dr. Piscitello Pacer pads placed anterior and posterior chest.   Cardioverted 1 time(s).   Cardioverted at  150 J. Synchronized biphasic Converted to NSR   Evaluation: Findings: Post procedure EKG shows: NSR Complications: None Patient did tolerate procedure well.  Time Spent Directly with the Patient:  45 minutes   [name], M.D., Ph.D.  

## 2023-04-23 NOTE — Anesthesia Postprocedure Evaluation (Signed)
Anesthesia Post Note  Patient: [name].  Procedure(s) Performed: CARDIOVERSION  Patient location during evaluation: [facility] Anesthesia Type: General Level of consciousness: awake and alert Pain management: pain level controlled Vital Signs Assessment: post-procedure vital signs reviewed and stable Respiratory status: spontaneous breathing, nonlabored ventilation, respiratory function stable and patient connected to nasal cannula oxygen Cardiovascular status: blood pressure returned to baseline and stable Postop Assessment: no apparent nausea or vomiting Anesthetic complications: no   No notable events documented.   Last Vitals:  Vitals:   04/23/23 1440 04/23/23 1451  BP: 91/62 110/67  Pulse: 94 95  Resp: 19 13  Temp:    SpO2:  92%    Last Pain:  Vitals:   04/23/23 1415  TempSrc:   PainSc: Asleep                 [name]

## 2023-04-23 NOTE — Consult Note (Signed)
PHARMACY CONSULT NOTE - ELECTROLYTES  Pharmacy Consult for Electrolyte Monitoring and Replacement   Recent Labs: Height: 5\' 5"  (165.1 cm) (Bilateral BKAs) Weight: 120.4 kg (265 lb 6.9 oz) IBW/kg (Calculated) : 61.5 Estimated Creatinine Clearance: 81.8 mL/min (by C-G formula based on SCr of 1.04 mg/dL).  Potassium (mmol/L)  Date Value  04/23/2023 4.7  07/12/2011 4.4   Magnesium (mg/dL)  Date Value  16/01/9603 2.3   Calcium (mg/dL)  Date Value  54/12/8117 8.9   Calcium, Total (mg/dL)  Date Value  14/78/2956 8.8   Albumin (g/dL)  Date Value  21/30/8657 2.4 (L)   Phosphorus (mg/dL)  Date Value  84/69/6295 4.4   Sodium (mmol/L)  Date Value  04/23/2023 132 (L)  07/12/2011 141   Assessment  Alec A Glasser Montez Hageman. is a 67 y.o. male presenting with hyperkalemia, severe dehydration and AKI. PMH significant for atrial fibrillation, asthma, congestive heart failure, COPD, coronary artery disease, depression, diabetes mellitus, gout, hyperlipidemia, hypertension, hypogonadism in male, morbid obesity, peripheral vascular disease, OSA on CPAP at night, esophageal cancer status post chemo and G-tube placement. Pharmacy has been consulted to monitor and replace electrolytes.Pt is on amio, lasix 20 mg daily via tube,   Diet: Tube feeds MIVF: Free water 140 mL per tube 6x/day  Goal of Therapy: K+ > 4 Mg > 2  Plan:  Potassium level normalizing, will discontinue scheduled lokelma for now Recheck BMP, Mag, Phos tomorrow with AM labs  Thank you for allowing pharmacy to be a part of this patient's care.  Bettey Costa, PharmD Clinical Pharmacist 04/23/2023 7:15 AM

## 2023-04-23 NOTE — Progress Notes (Signed)
Progress Note   Patient: [name]. [id number]:[id number] DOB: July 02, 1955 DOA: 03/28/2023     26 DOS: the patient was seen and examined on 04/23/2023     Brief hospital course:  Jossue A Kreager Montez Hageman. is a 67 y.o. male with medical history significant of atrial fibrillation, asthma, congestive heart failure, COPD, coronary artery disease, depression, diabetes mellitus, gout, hyperlipidemia, hypertension, hypogonadism in male, morbid obesity, peripheral vascular disease, OSA on CPAP at night, esophageal cancer status post chemo and G-tube placement.  Patient currently being managed for hyperkalemia, severe dehydration and AKI and acute metabolic encephalopathy. MRI of the brain was negative, ammonia level was also negative. Patient was treated with [facility] for hypercalcemia.  Patient was also started on oral amiodarone for atrial flutter with RVR.  Followed by cardiology. 12/21.  Patient developed urinary retention again, Foley catheter was anchored after repeat I&O cath. 12/22.  Potassium still high, continued on Lokelma, also added a dose of Lasix, give a dose of albumin follow-up blood pressure.    Assessment and Plan:   Acute on chronic persistent atrial flutter with rvr Intermittent hypotension. Patient underwent successful cardioversion 04/23/2023 Continue beta-blocker and diltiazem.   Acute kidney injury secondary to dehydration Hyperkalemia. Hyponatremia. This is secondary to dehydration from diarrhea.  Renal function had improved after fluids.  Patient has persistent hyperkalemia, currently on scheduled Lokelma.  Sodium level low but stable.  Discussed with pharmacy, there is no other potassium binder to choose for tube feeding.  Dietitian consulted and case discussed   Delirium secondary to acute metabolic encephalopathy.-Improving Klebsiella UTI Patient urine culture was positive for Klebsiella at time of admission, completed antibiotics.  Currently no symptoms.    Acute diarrhea secondary to C. difficile colitis. Patient is status post 10 days of Dificid followed with 10-day course of vancomycin.   Status post flexible sigmoidoscopy on 12/11.  Normal-appearing mucosa.  Biopsies taken.  Diarrhea has been improving as of 12/12 Diarrhea improving, but the patient still has some loose stools from tube feeding.   Continue as needed Imodium   Possible right perinephric hematoma --Described as possible abscess by radiology but per secure chat communication w/ Dr. Apolinar Junes on 12/9, this is a chornic complex hematoma dating back over a year, nothing that has been drainable thus far, possible underlying renal mass, and in light of significant comorbidities and as he is asymptomatic inpatient evaluation has been deferred Continue to follow-up with urologist as an outpatient    Urinary retention secondary to benign prostate hypertrophy. Patient developed significant urinary retention since last night with residual over 500 mL, patient had In-N-Out cath multiple times, Foley catheter was anchored again.   Chronic diastolic congestive heart failure --Recent EF of 55% on echo obtained on November 2024 No exacerbation.   COPD Continue as needed nebulization    Coronary artery disease,  Continue aspirin therapy   Diabetes mellitus type 2 with hyperglycemia Continue Semglee 30 units nightly (previous dose 35 units)   Hypothyroidism Continue levothyroxine     Moderate protein calorie malnutrition. Nutrition Problem: Moderate Malnutrition Etiology: chronic illness (esopahgeal mass, likely malginant) Signs/Symptoms: mild fat depletion, mild muscle depletion Interventions: Tube feeding   Peripheral vascular disease S/p bilateral BKAs --Continue aspirin  --Patient is allergic to statin therapy   OSA on CPAP at night,  Continue CPAP   Esophageal cancer status post chemo and G-tube placement. Continue bolus tube feeding.   Subjective:  Patient's mental  status is improved today Diarrhea improving Patient underwent successful DCCV  today by cardiologist Denies chest pain shortness of breath   Physical Exam: General exam: Appears calm and comfortable  Respiratory system: Clear to auscultation. Respiratory effort normal. Cardiovascular system: Appear regular with tachycardia. No JVD, murmurs, rubs, gallops or clicks.  Gastrointestinal system: Abdomen is nondistended, soft and nontender. No organomegaly or masses felt. Normal bowel sounds heard. Central nervous system: Alert and oriented x2. No focal neurological deficits. Extremities: Bilateral BKA. Skin: No rashes, lesions or ulcers Psychiatry:  Mood & affect appropriate.       Family Communication: Discussed with patient's wife present at bedside   Disposition: Status is: Inpatient Remains inpatient appropriate because: Severity of disease, inpatient procedure.    Data Reviewed: I have reviewed patient's MRI showing no acute intracranial pathology    Latest Ref Rng & Units 04/23/2023    4:52 AM 04/22/2023   10:05 AM 04/21/2023    4:48 AM  BMP  Glucose 70 - 99 mg/dL 161  096  045   BUN 8 - 23 mg/dL 33  35  34   Creatinine 0.61 - 1.24 mg/dL 4.09  8.11  9.14   Sodium 135 - 145 mmol/L 132  134  134   Potassium 3.5 - 5.1 mmol/L 4.7  4.9  5.2   Chloride 98 - 111 mmol/L 96  99  99   CO2 22 - 32 mmol/L 26  26  25    Calcium 8.9 - 10.3 mg/dL 8.9  8.9  9.3        Latest Ref Rng & Units 04/23/2023    4:52 AM 04/22/2023   10:05 AM 04/20/2023    3:56 AM  CBC  WBC 4.0 - 10.5 K/uL 10.0  10.2  10.8   Hemoglobin 13.0 - 17.0 g/dL 78.2  95.6  21.3   Hematocrit 39.0 - 52.0 % 33.3  34.0  37.2   Platelets 150 - 400 K/uL 264  256  294     Vitals:   04/23/23 1430 04/23/23 1440 04/23/23 1451 04/23/23 1509  BP: 109/62 91/62 110/67 107/71  Pulse: 93 94 95   Resp: 16 19 13    Temp:    98.6 F (37 C)  TempSrc:    Oral  SpO2: 95%  92%   Weight:      Height:         Time spent: 41  minutes  Author: Loyce Dys, MD 04/23/2023 4:57 PM  For on call review [email or web address].

## 2023-04-23 NOTE — Anesthesia Procedure Notes (Signed)
Date/Time: 04/23/2023 1:57 PM  Performed by: Ginger Carne, CRNAPre-anesthesia Checklist: Patient identified, Emergency Drugs available, Suction available, Patient being monitored and Timeout performed Patient Re-evaluated:Patient Re-evaluated prior to induction Oxygen Delivery Method: Nasal cannula Preoxygenation: Pre-oxygenation with 100% oxygen Induction Type: IV induction

## 2023-04-24 DIAGNOSIS — I4892 Unspecified atrial flutter: Secondary | ICD-10-CM | POA: Diagnosis not present

## 2023-04-24 DIAGNOSIS — E875 Hyperkalemia: Secondary | ICD-10-CM | POA: Diagnosis not present

## 2023-04-24 LAB — BASIC METABOLIC PANEL
Anion gap: 10 (ref 5–15)
BUN: 29 mg/dL — ABNORMAL HIGH (ref 8–23)
CO2: 26 mmol/L (ref 22–32)
Calcium: 9.3 mg/dL (ref 8.9–10.3)
Chloride: 96 mmol/L — ABNORMAL LOW (ref 98–111)
Creatinine, Ser: 0.95 mg/dL (ref 0.61–1.24)
GFR, Estimated: >60 mL/min (ref >60)
Glucose, Bld: 244 mg/dL — ABNORMAL HIGH (ref 70–99)
Potassium: 5.3 mmol/L — ABNORMAL HIGH (ref 3.5–5.1)
Sodium: 132 mmol/L — ABNORMAL LOW (ref 135–145)

## 2023-04-24 LAB — CBC WITH DIFFERENTIAL/PLATELET
Abs Immature Granulocytes: 0.09 10*3/uL — ABNORMAL HIGH (ref 0.00–0.07)
Basophils Absolute: 0.1 10*3/uL (ref 0.0–0.1)
Basophils Relative: 1 %
Eosinophils Absolute: 0.3 10*3/uL (ref 0.0–0.5)
Eosinophils Relative: 2 %
HCT: 35 % — ABNORMAL LOW (ref 39.0–52.0)
Hemoglobin: 11.4 g/dL — ABNORMAL LOW (ref 13.0–17.0)
Immature Granulocytes: 1 %
Lymphocytes Relative: 8 %
Lymphs Abs: 1.2 10*3/uL (ref 0.7–4.0)
MCH: 29.3 pg (ref 26.0–34.0)
MCHC: 32.6 g/dL (ref 30.0–36.0)
MCV: 90 fL (ref 80.0–100.0)
Monocytes Absolute: 0.8 10*3/uL (ref 0.1–1.0)
Monocytes Relative: 6 %
Neutro Abs: 11.5 10*3/uL — ABNORMAL HIGH (ref 1.7–7.7)
Neutrophils Relative %: 82 %
Platelets: 294 10*3/uL (ref 150–400)
RBC: 3.89 MIL/uL — ABNORMAL LOW (ref 4.22–5.81)
RDW: 14.6 % (ref 11.5–15.5)
WBC: 13.9 10*3/uL — ABNORMAL HIGH (ref 4.0–10.5)
nRBC: 0 % (ref 0.0–0.2)

## 2023-04-24 LAB — GLUCOSE, CAPILLARY
Glucose-Capillary: 247 mg/dL — ABNORMAL HIGH (ref 70–99)
Glucose-Capillary: 216 mg/dL — ABNORMAL HIGH (ref 70–99)
Glucose-Capillary: 196 mg/dL — ABNORMAL HIGH (ref 70–99)
Glucose-Capillary: 182 mg/dL — ABNORMAL HIGH (ref 70–99)

## 2023-04-24 LAB — MAGNESIUM: Magnesium: 2.4 mg/dL (ref 1.7–2.4)

## 2023-04-24 LAB — PHOSPHORUS: Phosphorus: 4.2 mg/dL (ref 2.5–4.6)

## 2023-04-24 MED ORDER — METOPROLOL SUCCINATE ER 25 MG PO TB24
25.0000 mg | ORAL_TABLET | Freq: Every day | ORAL | Status: DC
  Administered 2023-04-24 – 2023-04-26 (×3): 25 mg via ORAL
  Filled 2023-04-24 (×5): qty 1

## 2023-04-24 MED ORDER — SODIUM ZIRCONIUM CYCLOSILICATE 10 G PO PACK
10.0000 g | PACK | Freq: Once | ORAL | Status: AC
  Administered 2023-04-24: 10 g
  Filled 2023-04-24: qty 1

## 2023-04-24 NOTE — Progress Notes (Signed)
Progress Note   Patient: [name]. KVQ:[id number] DOB: 05-Sep-1955 DOA: 03/28/2023     27 DOS: the patient was seen and examined on 04/24/2023     Brief hospital course:  [name]. is a 67 y.o. male with medical history significant of atrial fibrillation, asthma, congestive heart failure, COPD, coronary artery disease, depression, diabetes mellitus, gout, hyperlipidemia, hypertension, hypogonadism in male, morbid obesity, peripheral vascular disease, OSA on CPAP at night, esophageal cancer status post chemo and G-tube placement.  Patient currently being managed for hyperkalemia, severe dehydration and AKI and acute metabolic encephalopathy. MRI of the brain was negative, ammonia level was also negative. Patient was treated with [facility] for hypercalcemia.  Patient was also started on oral amiodarone for atrial flutter with RVR.  Followed by cardiology. 12/21.  Patient developed urinary retention again, Foley catheter was anchored after repeat I&O cath. 12/22.  Potassium still high, continued on Lokelma, also added a dose of Lasix, give a dose of albumin follow-up blood pressure.     Assessment and Plan:     Acute on chronic persistent atrial flutter with rvr Intermittent hypotension. Patient underwent successful cardioversion 04/23/2023 Continue metoprolol Diltiazem was discontinued on 1222 due to hypotension   Acute kidney injury secondary to dehydration Hyperkalemia. Hyponatremia. This is secondary to dehydration from diarrhea.  Renal function had improved after fluids.  Patient has persistent hyperkalemia, currently on scheduled Lokelma.  Sodium level low but stable.  Discussed with pharmacy, there is no other potassium binder to choose for tube feeding.  Dietitian consulted and case discussed   Delirium secondary to acute metabolic encephalopathy.-Improving Klebsiella UTI Patient urine culture was positive for Klebsiella at time of admission, completed  antibiotics.  Currently no symptoms.   Acute diarrhea secondary to C. difficile colitis. Patient is status post 10 days of Dificid followed with 10-day course of vancomycin.   Status post flexible sigmoidoscopy on 12/11.  Normal-appearing mucosa.  Biopsies taken.  Diarrhea has been improving as of 12/12 Diarrhea improving, but the patient still has some loose stools from tube feeding.   Continue as needed Imodium   Possible right perinephric hematoma --Described as possible abscess by radiology but per secure chat communication w/ Dr. Apolinar Junes on 12/9, this is a chornic complex hematoma dating back over a year, nothing that has been drainable thus far, possible underlying renal mass, and in light of significant comorbidities and as he is asymptomatic inpatient evaluation has been deferred Continue to follow-up with urologist as an outpatient     Urinary retention secondary to benign prostate hypertrophy. Patient developed significant urinary retention since last night with residual over 500 mL, patient had In-N-Out cath multiple times, Foley catheter was anchored again.   Chronic diastolic congestive heart failure --Recent EF of 55% on echo obtained on November 2024 No exacerbation.   COPD Continue as needed nebulization    Coronary artery disease,  Continue aspirin therapy   Diabetes mellitus type 2 with hyperglycemia Continue Semglee 30 units nightly (previous dose 35 units)   Hypothyroidism Continue levothyroxine  Moderate protein calorie malnutrition. Nutrition Problem: Moderate Malnutrition Etiology: chronic illness (esopahgeal mass, likely malginant) Signs/Symptoms: mild fat depletion, mild muscle depletion Interventions: Tube feeding   Peripheral vascular disease S/p bilateral BKAs --Continue aspirin  --Patient is allergic to statin therapy   OSA on CPAP at night,  Continue CPAP   Esophageal cancer status post chemo and G-tube placement. Continue bolus tube  feeding.   Subjective:  Patient seen and examined at  bedside this morning S/p DCCV Denies worsening abdominal pain nausea or vomiting   Physical Exam: General exam: Appears calm and comfortable  Respiratory system: Clear to auscultation. Respiratory effort normal. Cardiovascular system: Appear regular with tachycardia. No JVD, murmurs, rubs, gallops or clicks.  Gastrointestinal system: Abdomen is nondistended, soft and nontender. No organomegaly or masses felt. Normal bowel sounds heard. Central nervous system: Alert and oriented x2. No focal neurological deficits. Extremities: Bilateral BKA. Skin: No rashes, lesions or ulcers Psychiatry:  Mood & affect appropriate.        Family Communication: Discussed with patient's wife present at bedside   Disposition: Status is: Inpatient Remains inpatient appropriate because: Severity of disease, inpatient procedure.    Data Reviewed:    Latest Ref Rng & Units 04/24/2023    6:16 AM 04/23/2023    4:52 AM 04/22/2023   10:05 AM  CBC  WBC 4.0 - 10.5 K/uL 13.9  10.0  10.2   Hemoglobin 13.0 - 17.0 g/dL 50.0  93.8  18.2   Hematocrit 39.0 - 52.0 % 35.0  33.3  34.0   Platelets 150 - 400 K/uL 294  264  256        Latest Ref Rng & Units 04/24/2023    6:16 AM 04/23/2023    4:52 AM 04/22/2023   10:05 AM  BMP  Glucose 70 - 99 mg/dL 993  716  967   BUN 8 - 23 mg/dL 29  33  35   Creatinine 0.61 - 1.24 mg/dL 8.93  8.10  1.75   Sodium 135 - 145 mmol/L 132  132  134   Potassium 3.5 - 5.1 mmol/L 5.3  4.7  4.9   Chloride 98 - 111 mmol/L 96  96  99   CO2 22 - 32 mmol/L 26  26  26    Calcium 8.9 - 10.3 mg/dL 9.3  8.9  8.9      Vitals:   04/24/23 0425 04/24/23 0801 04/24/23 1149 04/24/23 1629  BP:      Pulse:   98 82  Resp:      Temp:  98.6 F (37 C) 98.6 F (37 C) 98.6 F (37 C)  TempSrc:  Oral Oral Oral  SpO2:   92% 96%  Weight: 118.7 kg     Height:        Author: Loyce Dys, MD 04/24/2023 4:32 PM  For on call review  [email or web address].

## 2023-04-24 NOTE — Consult Note (Signed)
PHARMACY CONSULT NOTE - ELECTROLYTES  Pharmacy Consult for Electrolyte Monitoring and Replacement   Recent Labs: Height: 5\' 5"  (165.1 cm) (Bilateral BKAs) Weight: 118.7 kg (261 lb 11 oz) IBW/kg (Calculated) : 61.5 Estimated Creatinine Clearance: 88.8 mL/min (by C-G formula based on SCr of 0.95 mg/dL).  Potassium (mmol/L)  Date Value  04/24/2023 5.3 (H)  07/12/2011 4.4   Magnesium (mg/dL)  Date Value  40/98/1191 2.4   Calcium (mg/dL)  Date Value  47/82/9562 9.3   Calcium, Total (mg/dL)  Date Value  13/11/6576 8.8   Albumin (g/dL)  Date Value  46/96/2952 2.4 (L)   Phosphorus (mg/dL)  Date Value  84/13/2440 4.2   Sodium (mmol/L)  Date Value  04/24/2023 132 (L)  07/12/2011 141   Assessment  Yousaf A Dade Montez Hageman. is a 67 y.o. male presenting with hyperkalemia, severe dehydration and AKI. PMH significant for atrial fibrillation, asthma, congestive heart failure, COPD, coronary artery disease, depression, diabetes mellitus, gout, hyperlipidemia, hypertension, hypogonadism in male, morbid obesity, peripheral vascular disease, OSA on CPAP at night, esophageal cancer status post chemo and G-tube placement. Pharmacy has been consulted to monitor and replace electrolytes.Pt is on amio, lasix 20 mg daily via tube,   Diet: Tube feeds MIVF: Free water 140 mL per tube 6x/day  Goal of Therapy: K+ > 4 Mg > 2  Plan:  K 5.3    lokelma daily just discontinued yesterday 04/23/23 Will order lokelma 10 gm po x1 for now Recheck BMP, Mag, Phos tomorrow with AM labs  Thank you for allowing pharmacy to be a part of this patient's care.  Angelique Blonder, PharmD Clinical Pharmacist 04/24/2023 8:16 AM

## 2023-04-24 NOTE — Plan of Care (Signed)
  Problem: Coping: Goal: Ability to adjust to condition or change in health will improve Outcome: Progressing   Problem: Skin Integrity: Goal: Risk for impaired skin integrity will decrease Outcome: Progressing   Problem: Nutrition: Goal: Adequate nutrition will be maintained Outcome: Progressing

## 2023-04-24 NOTE — Progress Notes (Signed)
Progress Note  Patient Name: [name]. Date of Encounter: 04/24/2023  Primary Cardiologist: Kirke Corin  Subjective   Status post successful DCCV 12/24, remains in sinus rhythm with sinus tachycardia. No symptoms of angina or cardiac decompensation.   Inpatient Medications    Scheduled Meds:  amiodarone  400 mg Per Tube BID   apixaban  5 mg Per Tube BID   ascorbic acid  500 mg Per Tube Daily   aspirin  81 mg Per Tube Daily   Chlorhexidine Gluconate Cloth  6 each Topical Daily   cholecalciferol  2,000 Units Per Tube Daily   clonazePAM  0.5 mg Per Tube BID   cyanocobalamin  10,000 mcg Per Tube Daily   ezetimibe  10 mg Per Tube Daily   feeding supplement (GLUCERNA 1.5 CAL)  237 mL Per Tube 6 X Daily   ferrous sulfate  300 mg Per Tube Daily   fiber supplement (BANATROL TF)  60 mL Per Tube TID   free water  140 mL Per Tube 6 X Daily   furosemide  20 mg Per Tube Daily   insulin aspart  0-5 Units Subcutaneous QHS   insulin aspart  0-9 Units Subcutaneous TID WC   insulin glargine-yfgn  30 Units Subcutaneous QHS   levothyroxine  25 mcg Per Tube Q0600   multivitamin with minerals  1 tablet Per Tube Daily   pramipexole  2 mg Per Tube QHS   primidone  250 mg Per Tube BID   sodium zirconium cyclosilicate  10 g Per Tube Once   Continuous Infusions:   PRN Meds: acetaminophen, ipratropium-albuterol, loperamide HCl, nitroGLYCERIN, ondansetron, mouth rinse, risperiDONE   Vital Signs    Vitals:   04/24/23 0400 04/24/23 0422 04/24/23 0425 04/24/23 0801  BP: 96/61     Pulse:  86    Resp: 20     Temp:  98 F (36.7 C)  98.6 F (37 C)  TempSrc:  Oral  Oral  SpO2:  96%    Weight:   118.7 kg   Height:        Intake/Output Summary (Last 24 hours) at 04/24/2023 0837 Last data filed at 04/24/2023 0600 Gross per 24 hour  Intake 904 ml  Output 1150 ml  Net -246 ml   Filed Weights   04/21/23 0344 04/22/23 0346 04/24/23 0425  Weight: 119.7 kg 120.4 kg 118.7 kg     Telemetry    Sinus rhythm with sinus tachycardia with rates in the 90s to low 100s bpm - Personally Reviewed  ECG    No new tracings - Personally Reviewed  Physical Exam   GEN: No acute distress.   Neck: No JVD. Cardiac: RRR, no murmurs, rubs, or gallops.  Respiratory: Clear to auscultation bilaterally.  GI: Soft, nontender, non-distended.   MS: Status post bilateral BKA. Neuro:  Alert and oriented x 1 (person only); Nonfocal.  Psych: Normal affect.  Labs    Chemistry Recent Labs  Lab 04/22/23 1005 04/23/23 0452 04/24/23 0616  NA 134* 132* 132*  K 4.9 4.7 5.3*  CL 99 96* 96*  CO2 26 26 26   GLUCOSE 203* 179* 244*  BUN 35* 33* 29*  CREATININE 1.09 1.04 0.95  CALCIUM 8.9 8.9 9.3  GFRNONAA >60 >60 >60  ANIONGAP 9 10 10      Hematology Recent Labs  Lab 04/22/23 1005 04/23/23 0452 04/24/23 0616  WBC 10.2 10.0 13.9*  RBC 3.81* 3.76* 3.89*  HGB 11.2* 10.9* 11.4*  HCT 34.0* 33.3* 35.0*  MCV 89.2 88.6 90.0  MCH 29.4 29.0 29.3  MCHC 32.9 32.7 32.6  RDW 14.5 14.2 14.6  PLT 256 264 294    Cardiac EnzymesNo results for input(s): "TROPONINI" in the last 168 hours. No results for input(s): "TROPIPOC" in the last 168 hours.   BNPNo results for input(s): "BNP", "PROBNP" in the last 168 hours.   DDimer No results for input(s): "DDIMER" in the last 168 hours.   Radiology    No results found.  Cardiac Studies   2D echo 03/17/2023: 1. Left ventricular ejection fraction, by estimation, is 55 %. The left  ventricle has normal function. The left ventricle demonstrates regional  wall motion abnormalities (mild hypokinesis of the basal inferior wall).  There is mild left ventricular  hypertrophy. Left ventricular diastolic parameters are consistent with  Grade I diastolic dysfunction (impaired relaxation). The average left  ventricular global longitudinal strain is -12.0 %.   2. Right ventricular systolic function is normal. The right ventricular  size is  normal. Tricuspid regurgitation signal is inadequate for assessing  PA pressure.   3. The mitral valve is normal in structure. No evidence of mitral valve  regurgitation. No evidence of mitral stenosis.   4. The aortic valve is tricuspid. There is mild calcification of the  aortic valve. Aortic valve regurgitation is not visualized. Aortic valve  sclerosis/calcification is present, without any evidence of aortic  stenosis.   5. The inferior vena cava is normal in size with greater than 50%  respiratory variability, suggesting right atrial pressure of 3 mmHg.   Patient Profile     67 y.o. male with history of CAD status post CABG in 2006, A-fib status post prior DCCV, esophageal cancer status postchemotherapy and G-tube placement, PAD status post bilateral BKA, DM2, HTN, and obesity who we are seeing for atrial flutter with RVR.  Assessment & Plan    Atrial flutter with RVR - History of paroxysmal Afib with multiple cardioversions - CHA2DS2-VASc of at least 5  - Continue apixaban 5 mg BID - Status post successful DCCV 12/24 and remains in sinus rhythm at this time - Continue amiodarone 400 mg bid with plan to transition to 200 mg bid on 12/30 for 7 days then 200 mg daily thereafter  - IV digoxin has not been effective   - Short-acting metoprolol and diltiazem were stopped on 12/22 due to relative hypotension, though this had been stable for a while  Acute metabolic encephalopathy - Seems to be back to baseline - MRI of the brain without acute process   CAD s/p CABG/PAD s/p BKA - Denies chest pain - On ezetimibe - Statin intolerance  Hyperkalemia - Stable - Per primary service   Normocytic anemia - Hgb stable  Esophageal cancer:  - Has not received treatment yet due to multiple health issues        For questions or updates, please contact [facility] Please consult [email or web address] for contact info under Cardiology/STEMI.    Signed, Lorine Bears, MD [facility] 04/24/2023, 8:37 AM

## 2023-04-25 ENCOUNTER — Ambulatory Visit: Payer: Medicare Other

## 2023-04-25 ENCOUNTER — Encounter: Payer: Self-pay | Admitting: Cardiovascular Disease

## 2023-04-25 DIAGNOSIS — E875 Hyperkalemia: Secondary | ICD-10-CM | POA: Diagnosis not present

## 2023-04-25 LAB — BASIC METABOLIC PANEL
Anion gap: 9 (ref 5–15)
BUN: 26 mg/dL — ABNORMAL HIGH (ref 8–23)
CO2: 29 mmol/L (ref 22–32)
Calcium: 9.2 mg/dL (ref 8.9–10.3)
Chloride: 93 mmol/L — ABNORMAL LOW (ref 98–111)
Creatinine, Ser: 1.01 mg/dL (ref 0.61–1.24)
GFR, Estimated: >60 mL/min (ref >60)
Glucose, Bld: 180 mg/dL — ABNORMAL HIGH (ref 70–99)
Potassium: 4.7 mmol/L (ref 3.5–5.1)
Sodium: 131 mmol/L — ABNORMAL LOW (ref 135–145)

## 2023-04-25 LAB — CBC WITH DIFFERENTIAL/PLATELET
Abs Immature Granulocytes: 0.05 10*3/uL (ref 0.00–0.07)
Basophils Absolute: 0.1 10*3/uL (ref 0.0–0.1)
Basophils Relative: 1 %
Eosinophils Absolute: 0.3 10*3/uL (ref 0.0–0.5)
Eosinophils Relative: 3 %
HCT: 35 % — ABNORMAL LOW (ref 39.0–52.0)
Hemoglobin: 11.1 g/dL — ABNORMAL LOW (ref 13.0–17.0)
Immature Granulocytes: 1 %
Lymphocytes Relative: 15 %
Lymphs Abs: 1.5 10*3/uL (ref 0.7–4.0)
MCH: 29.4 pg (ref 26.0–34.0)
MCHC: 31.7 g/dL (ref 30.0–36.0)
MCV: 92.6 fL (ref 80.0–100.0)
Monocytes Absolute: 0.6 10*3/uL (ref 0.1–1.0)
Monocytes Relative: 6 %
Neutro Abs: 7.6 10*3/uL (ref 1.7–7.7)
Neutrophils Relative %: 74 %
Platelets: 254 10*3/uL (ref 150–400)
RBC: 3.78 MIL/uL — ABNORMAL LOW (ref 4.22–5.81)
RDW: 14.6 % (ref 11.5–15.5)
WBC: 10.1 10*3/uL (ref 4.0–10.5)
nRBC: 0 % (ref 0.0–0.2)

## 2023-04-25 LAB — GLUCOSE, CAPILLARY
Glucose-Capillary: 197 mg/dL — ABNORMAL HIGH (ref 70–99)
Glucose-Capillary: 241 mg/dL — ABNORMAL HIGH (ref 70–99)
Glucose-Capillary: 214 mg/dL — ABNORMAL HIGH (ref 70–99)
Glucose-Capillary: 180 mg/dL — ABNORMAL HIGH (ref 70–99)

## 2023-04-25 MED ORDER — CLONAZEPAM 0.25 MG PO TBDP
0.2500 mg | ORAL_TABLET | Freq: Two times a day (BID) | ORAL | Status: DC
  Administered 2023-04-25 – 2023-05-03 (×17): 0.25 mg
  Filled 2023-04-25 (×17): qty 1

## 2023-04-25 NOTE — Progress Notes (Signed)
Progress Note   Patient: [name]. UJW:[id number] DOB: [birth year] DOA: 03/28/2023     28 DOS: the patient was seen and examined on 04/25/2023     Brief hospital course:  Alec A Carr Montez Hageman. is a 67 y.o. male with medical history significant of atrial fibrillation, asthma, congestive heart failure, COPD, coronary artery disease, depression, diabetes mellitus, gout, hyperlipidemia, hypertension, hypogonadism in male, morbid obesity, peripheral vascular disease, OSA on CPAP at night, esophageal cancer status post chemo and G-tube placement.  Patient currently being managed for hyperkalemia, severe dehydration and AKI and acute metabolic encephalopathy. MRI of the brain was negative, ammonia level was also negative. Patient was treated with [facility] for hypercalcemia.  Patient was also started on oral amiodarone for atrial flutter with RVR.  Followed by cardiology. 12/21.  Patient developed urinary retention again, Foley catheter was anchored after repeat I&O cath. 12/22.  Potassium still high, continued on Lokelma, also added a dose of Lasix, give a dose of albumin follow-up blood pressure.     Assessment and Plan:  Acute on chronic persistent atrial flutter with rvr Intermittent hypotension. Patient underwent successful cardioversion 04/23/2023 Continue metoprolol Diltiazem was discontinued on 12/22 due to hypotension   Acute kidney injury secondary to dehydration Hyperkalemia. Hyponatremia. This is secondary to dehydration from diarrhea.  Renal function had improved after fluids.  Patient has persistent hyperkalemia, currently on scheduled Lokelma.  Sodium level low but stable.  Discussed with pharmacy, there is no other potassium binder to choose for tube feeding.  Dietitian consulted and case discussed   Delirium secondary to acute metabolic encephalopathy.-Improving Klebsiella UTI Patient urine culture was positive for Klebsiella at time of admission, completed  antibiotics.  Currently no symptoms.   Acute diarrhea secondary to C. difficile colitis. Patient is status post 10 days of Dificid followed with 10-day course of vancomycin.   Status post flexible sigmoidoscopy on 12/11.  Normal-appearing mucosa.  Biopsies taken.  Diarrhea has been improving as of 12/12 Diarrhea improving, but the patient still has some loose stools from tube feeding.   Continue as needed Imodium   Possible right perinephric hematoma --Described as possible abscess by radiology but per secure chat communication w/ Dr. Apolinar Junes on 12/9, this is a chornic complex hematoma dating back over a year, nothing that has been drainable thus far, possible underlying renal mass, and in light of significant comorbidities and as he is asymptomatic inpatient evaluation has been deferred Continue to follow-up with urologist as an outpatient     Urinary retention secondary to benign prostate hypertrophy. Patient developed significant urinary retention since last night with residual over 500 mL, patient had In-N-Out cath multiple times, Foley catheter was anchored again.   Chronic diastolic congestive heart failure --Recent EF of 55% on echo obtained on November 2024 No exacerbation.   COPD Continue as needed nebulization    Coronary artery disease,  Continue aspirin therapy   Diabetes mellitus type 2 with hyperglycemia Continue Semglee 30 units nightly (previous dose 35 units)   Hypothyroidism Continue levothyroxine   Moderate protein calorie malnutrition. Nutrition Problem: Moderate Malnutrition Etiology: chronic illness (esopahgeal mass, likely malginant) Signs/Symptoms: mild fat depletion, mild muscle depletion Interventions: Tube feeding   Peripheral vascular disease S/p bilateral BKAs --Continue aspirin  --Patient is allergic to statin therapy   OSA on CPAP at night,  Continue CPAP   Esophageal cancer status post chemo and G-tube placement. Continue bolus tube  feeding.   Subjective:  Patient seen and examined today Mental status  in bed at time on presentation however still not back to baseline Denies chest pain nausea vomiting abdominal pain   Physical Exam: General exam: Appears calm and comfortable  Respiratory system: Clear to auscultation. Respiratory effort normal. Cardiovascular system: Appear regular with tachycardia. No JVD, murmurs, rubs, gallops or clicks.  Gastrointestinal system: Abdomen is nondistended, soft and nontender. No organomegaly or masses felt. Normal bowel sounds heard. Central nervous system: Alert and oriented x2. No focal neurological deficits. Extremities: Bilateral BKA. Skin: No rashes, lesions or ulcers Psychiatry:  Mood & affect appropriate.        Family Communication: Discussed with patient's wife present at bedside   Disposition: Status is: Inpatient Remains inpatient appropriate because: Severity of disease, and mental status not yet at baseline    Data Reviewed:     Latest Ref Rng & Units 04/25/2023    5:03 AM 04/24/2023    6:16 AM 04/23/2023    4:52 AM  CBC  WBC 4.0 - 10.5 K/uL 10.1  13.9  10.0   Hemoglobin 13.0 - 17.0 g/dL 16.1  09.6  04.5   Hematocrit 39.0 - 52.0 % 35.0  35.0  33.3   Platelets 150 - 400 K/uL 254  294  264        Latest Ref Rng & Units 04/25/2023    5:03 AM 04/24/2023    6:16 AM 04/23/2023    4:52 AM  BMP  Glucose 70 - 99 mg/dL 409  811  914   BUN 8 - 23 mg/dL 26  29  33   Creatinine 0.61 - 1.24 mg/dL 7.82  9.56  2.13   Sodium 135 - 145 mmol/L 131  132  132   Potassium 3.5 - 5.1 mmol/L 4.7  5.3  4.7   Chloride 98 - 111 mmol/L 93  96  96   CO2 22 - 32 mmol/L 29  26  26    Calcium 8.9 - 10.3 mg/dL 9.2  9.3  8.9      Vitals:   04/25/23 0440 04/25/23 0810 04/25/23 1150 04/25/23 1500  BP: 99/71 118/70 105/68   Pulse: 81 90 88   Resp: 17 16 16    Temp: 97.7 F (36.5 C) 97.6 F (36.4 C) 98.3 F (36.8 C) 97.8 F (36.6 C)  TempSrc: Oral Oral  Oral  SpO2: 92% 90%  96%   Weight:      Height:         Author: Loyce Dys, MD 04/25/2023 4:58 PM  For on call review [email or web address].

## 2023-04-25 NOTE — Plan of Care (Signed)
  Problem: Fluid Volume: Goal: Ability to maintain a balanced intake and output will improve Outcome: Progressing   Problem: Nutritional: Goal: Maintenance of adequate nutrition will improve Outcome: Progressing   Problem: Skin Integrity: Goal: Risk for impaired skin integrity will decrease Outcome: Progressing   

## 2023-04-25 NOTE — Progress Notes (Signed)
Physical Therapy Treatment Patient Details Name: [name]. MRN: [id number] DOB: 1956-02-08 Today's Date: 04/25/2023   History of Present Illness Pt is a 67 yo male pt admitted for hyperkalemia, AKI, UTI, and perinephric abscess. PMH includes Afib, asthma, CHF, COPD, CAD, depression, PVD, and esophageal cancer. Currently with PEG tube placed. Of note B LE BKA.    PT Comments  Pt was long sitting in bed upon arrival." I need to get those clothes on and go pick up my brother in law." Pt is alert but disoriented x 3. He was only truly oriented to sell. Author attempts to reorient t however he has moments of clarity and then returns to disoriented/confusion. He is able to consistently follow commands and remains pleasant throughout. Pt was agreeable to sitting EOB and performing there ex to promote strengthening and ROM of BLEs. Manually resisted BUE exercises performed also. Pt tolerated session well. HR much less tachy today versus previous few sessions. HR peak at 101 bpm with exercises. HOB placed > 30 degrees at conclusion of session with bed alarm in place and call bell in reach. Acute PT will continue to follow per current POC.    If plan is discharge home, recommend the following: Two people to help with walking and/or transfers;A lot of help with bathing/dressing/bathroom;Assistance with cooking/housework;Assistance with feeding;Direct supervision/assist for medications management;Direct supervision/assist for financial management;Assist for transportation;Help with stairs or ramp for entrance;Supervision due to cognitive status     Equipment Recommendations  Other (comment) (Defer to next level of care)       Precautions / Restrictions Precautions Precautions: Fall Precaution Comments: BLE BKA, no prosthetics in room Restrictions Weight Bearing Restrictions Per Provider Order: No RLE Weight Bearing Per Provider Order: Weight bearing as tolerated (no prosthetic are in  room) LLE Weight Bearing Per Provider Order: Weight bearing as tolerated (no prosthetic are in room)     Mobility  Bed Mobility Overal bed mobility: Needs Assistance Bed Mobility: Supine to Sit, Sit to Supine  Supine to sit: Mod assist Sit to supine: Min assist General bed mobility comments: Pt was able to sit EOB x ~ 8 minutes while performing ther ex with BUE/BLEs    Transfers  General transfer comment: pt would be mechanical lift for any transfers at this time. will bring slideboard next session to attempt OOB to relciner    Ambulation/Gait  General Gait Details: non ambulatory at baseline   Balance Overall balance assessment: Needs assistance Sitting-balance support: Bilateral upper extremity supported, Feet unsupported Sitting balance-Leahy Scale: Fair Sitting balance - Comments: pt was able to sit EOB and perform ther ex without LOB. Pt used BUE support while performing exercises to keep balance      Cognition Arousal: Alert Behavior During Therapy: WFL for tasks assessed/performed Overall Cognitive Status: Impaired/Different from baseline    General Comments: Pt is alert but disoriented. pt needs re-orientation to time, setting, and situation. pt only oriented to self. Does follow commands and is pleasantly confused throughout           General Comments General comments (skin integrity, edema, etc.): Most exercises performed to promote increased BLE ROM and strength. Educated pt on importanced of extending knees to stretch HS. Will need more encouragement/ education in future sessions. BUE manually resisteted tyher ex while seated EOB and while long sitting in bed      Pertinent Vitals/Pain Pain Assessment Pain Assessment: No/denies pain Pain Score: 0-No pain     PT Goals (current goals can  now be found in the care plan section) Acute Rehab PT Goals Patient Stated Goal: none stated Progress towards PT goals: Not progressing toward goals - comment     Frequency    Min 1X/week       AM-PAC PT "6 Clicks" Mobility   Outcome Measure  Help needed turning from your back to your side while in a flat bed without using bedrails?: A Lot Help needed moving from lying on your back to sitting on the side of a flat bed without using bedrails?: A Lot Help needed moving to and from a bed to a chair (including a wheelchair)?: Total Help needed standing up from a chair using your arms (e.g., wheelchair or bedside chair)?: Total Help needed to walk in hospital room?: Total Help needed climbing 3-5 steps with a railing? : Total 6 Click Score: 8    End of Session   Activity Tolerance: Patient tolerated treatment well;Patient limited by fatigue;Other (comment) (limited by confusion/ lack of legs) Patient left: in bed;with call bell/phone within reach;with bed alarm set Nurse Communication: Mobility status PT Visit Diagnosis: Muscle weakness (generalized) (M62.81);Other abnormalities of gait and mobility (R26.89)     Time: 1448-1500 PT Time Calculation (min) (ACUTE ONLY): 12 min  Charges:    $Therapeutic Exercise: 8-22 mins PT General Charges $$ ACUTE PT VISIT: 1 Visit                    Jetta Lout PTA 04/25/23, 3:18 PM

## 2023-04-25 NOTE — Plan of Care (Signed)

## 2023-04-25 NOTE — Progress Notes (Signed)
Nutrition Follow-up  DOCUMENTATION CODES:   Obesity unspecified, Non-severe (moderate) malnutrition in context of chronic illness  INTERVENTION:   -TF via g-tube:    237 ml Glucerna 1.5 6 times daily  70 ml free water flush before each feeding and 70 ml free water flush after each after each feeding administration   Tube feeding regimen provides 2136 kcal (100% of needs), 118 grams of protein, and 1080 ml of H2O.  Total free water: 1920 ml daily   -Allow ice chips for dry mouth per discussion with MD -Continue 1 packet Banatrol TID  NUTRITION DIAGNOSIS:   Moderate Malnutrition related to chronic illness (esopahgeal mass, likely malginant) as evidenced by mild fat depletion, mild muscle depletion.  Ongoing  GOAL:   Patient will meet greater than or equal to 90% of their needs  Met with TF  MONITOR:   TF tolerance  REASON FOR ASSESSMENT:   Consult Assessment of nutrition requirement/status  ASSESSMENT:   Pt with medical history significant of atrial fibrillation, asthma, congestive heart failure, COPD, coronary artery disease, depression, diabetes mellitus, gout, hyperlipidemia, hypertension, hypogonadism in male, morbid obesity, peripheral vascular disease, OSA on CPAP at night, esophageal cancer status post chemo and G-tube placement.  12/24- s/p cardioversion  Reviewed I/O's: -2 L x 24 hours and -9.3 L since 04/11/23  Per cardiology notes, cardioversion successful. He remains in sinus rhythm.   Pt remains NPO and received TF via PEG for sole source nutrition. Pt tolerating TF well.  Wt has been stable over the past week.  Medications reviewed and include vitamin C, vitamin D3, vitamin B-12, ferrous sulfate, and lasix.  Per TOC notes, plan for SNF placement ([facility]) at discharge.   Labs reviewed: Na: 131, K WDL, CBGS: 182-197 (inpatient orders for glycemic control are 0-15 units insulin aspart daily at bedtime, 0-9 units insulin aspart TID with meals,  and 30 units insulin glargine-yfgn daily).    Diet Order:   Diet Order             Diet NPO time specified  Diet effective now                   EDUCATION NEEDS:   Education needs have been addressed  Skin:  Skin Assessment: Reviewed RN Assessment  Last BM:  04/21/23 (type 6)  Height:   Ht Readings from Last 1 Encounters:  04/02/23 5\' 5"  (1.651 m)    Weight:   Wt Readings from Last 1 Encounters:  04/24/23 118.7 kg    Ideal Body Weight:  75.2 kg (adjusted for bilateral BKAs)  BMI:  Body mass index is 43.55 kg/m.  Estimated Nutritional Needs:   Kcal:  9604-5409  Protein:  115-130 grams  Fluid:  > 2 L    Levada Schilling, RD, LDN, CDCES Registered Dietitian III Certified Diabetes Care and Education Specialist If unable to reach this RD, please use "RD Inpatient" group chat on secure chat between hours of 8am-4 pm daily

## 2023-04-25 NOTE — Consult Note (Signed)
PHARMACY CONSULT NOTE - ELECTROLYTES  Pharmacy Consult for Electrolyte Monitoring and Replacement   Recent Labs: Height: 5\' 5"  (165.1 cm) (Bilateral BKAs) Weight: 118.7 kg (261 lb 11 oz) IBW/kg (Calculated) : 61.5 Estimated Creatinine Clearance: 83.6 mL/min (by C-G formula based on SCr of 1.01 mg/dL).  Potassium (mmol/L)  Date Value  04/25/2023 4.7  07/12/2011 4.4   Magnesium (mg/dL)  Date Value  24/40/1027 2.4   Calcium (mg/dL)  Date Value  25/36/6440 9.2   Calcium, Total (mg/dL)  Date Value  34/74/2595 8.8   Albumin (g/dL)  Date Value  63/87/5643 2.4 (L)   Phosphorus (mg/dL)  Date Value  32/95/1884 4.2   Sodium (mmol/L)  Date Value  04/25/2023 131 (L)  07/12/2011 141   Assessment  [name] Montez Hageman. is a 67 y.o. male presenting with hyperkalemia, severe dehydration and AKI. PMH significant for atrial fibrillation, asthma, congestive heart failure, COPD, coronary artery disease, depression, diabetes mellitus, gout, hyperlipidemia, hypertension, hypogonadism in male, morbid obesity, peripheral vascular disease, OSA on CPAP at night, esophageal cancer status post chemo and G-tube placement. Pharmacy has been consulted to monitor and replace electrolytes.Pt is on amio, lasix 20 mg daily via tube,   Diet: Tube feeds MIVF: Free water 140 mL per tube 6x/day  Goal of Therapy: K+ > 4 Mg > 2  Plan:  --Persistent mild hyponatremia, monitor --Hyperkalemia resolved --No electrolyte replacement indicated today --Defer labs for tomorrow, consider checking [phone]  Thank you for allowing pharmacy to be a part of this patient's care.  Tressie Ellis 04/25/2023 8:49 AM

## 2023-04-26 DIAGNOSIS — E875 Hyperkalemia: Secondary | ICD-10-CM | POA: Diagnosis not present

## 2023-04-26 LAB — CBC WITH DIFFERENTIAL/PLATELET
Abs Immature Granulocytes: 0.07 10*3/uL (ref 0.00–0.07)
Basophils Absolute: 0.1 10*3/uL (ref 0.0–0.1)
Basophils Relative: 1 %
Eosinophils Absolute: 0.3 10*3/uL (ref 0.0–0.5)
Eosinophils Relative: 2 %
HCT: 34.2 % — ABNORMAL LOW (ref 39.0–52.0)
Hemoglobin: 11.3 g/dL — ABNORMAL LOW (ref 13.0–17.0)
Immature Granulocytes: 1 %
Lymphocytes Relative: 11 %
Lymphs Abs: 1.2 10*3/uL (ref 0.7–4.0)
MCH: 29.7 pg (ref 26.0–34.0)
MCHC: 33 g/dL (ref 30.0–36.0)
MCV: 89.8 fL (ref 80.0–100.0)
Monocytes Absolute: 0.6 10*3/uL (ref 0.1–1.0)
Monocytes Relative: 5 %
Neutro Abs: 8.9 10*3/uL — ABNORMAL HIGH (ref 1.7–7.7)
Neutrophils Relative %: 80 %
Platelets: 248 10*3/uL (ref 150–400)
RBC: 3.81 MIL/uL — ABNORMAL LOW (ref 4.22–5.81)
RDW: 14.7 % (ref 11.5–15.5)
WBC: 11.1 10*3/uL — ABNORMAL HIGH (ref 4.0–10.5)
nRBC: 0 % (ref 0.0–0.2)

## 2023-04-26 LAB — GLUCOSE, CAPILLARY
Glucose-Capillary: 217 mg/dL — ABNORMAL HIGH (ref 70–99)
Glucose-Capillary: 287 mg/dL — ABNORMAL HIGH (ref 70–99)
Glucose-Capillary: 237 mg/dL — ABNORMAL HIGH (ref 70–99)
Glucose-Capillary: 227 mg/dL — ABNORMAL HIGH (ref 70–99)
Glucose-Capillary: 194 mg/dL — ABNORMAL HIGH (ref 70–99)

## 2023-04-26 LAB — BASIC METABOLIC PANEL
Anion gap: 10 (ref 5–15)
BUN: 26 mg/dL — ABNORMAL HIGH (ref 8–23)
CO2: 25 mmol/L (ref 22–32)
Calcium: 9.3 mg/dL (ref 8.9–10.3)
Chloride: 99 mmol/L (ref 98–111)
Creatinine, Ser: 1.01 mg/dL (ref 0.61–1.24)
GFR, Estimated: >60 mL/min (ref >60)
Glucose, Bld: 211 mg/dL — ABNORMAL HIGH (ref 70–99)
Potassium: 5.1 mmol/L (ref 3.5–5.1)
Sodium: 134 mmol/L — ABNORMAL LOW (ref 135–145)

## 2023-04-26 MED ORDER — AMIODARONE HCL 200 MG PO TABS
200.0000 mg | ORAL_TABLET | Freq: Every day | ORAL | Status: DC
Start: 2023-05-07 — End: 2023-05-03

## 2023-04-26 MED ORDER — INSULIN GLARGINE-YFGN 100 UNIT/ML ~~LOC~~ SOLN
40.0000 [IU] | Freq: Every day | SUBCUTANEOUS | Status: DC
  Administered 2023-04-26 – 2023-04-30 (×5): 40 [IU] via SUBCUTANEOUS
  Filled 2023-04-26 (×6): qty 0.4

## 2023-04-26 MED ORDER — AMIODARONE HCL 200 MG PO TABS
200.0000 mg | ORAL_TABLET | Freq: Two times a day (BID) | ORAL | Status: DC
  Administered 2023-04-30 – 2023-05-03 (×7): 200 mg via ORAL
  Filled 2023-04-26 (×7): qty 1

## 2023-04-26 MED ORDER — HALOPERIDOL LACTATE 5 MG/ML IJ SOLN
2.5000 mg | Freq: Once | INTRAMUSCULAR | Status: AC
  Administered 2023-04-26: 2.5 mg via INTRAMUSCULAR
  Filled 2023-04-26: qty 1

## 2023-04-26 MED ORDER — QUETIAPINE FUMARATE 25 MG PO TABS
25.0000 mg | ORAL_TABLET | Freq: Every day | ORAL | Status: DC
  Administered 2023-04-26 – 2023-05-02 (×7): 25 mg
  Filled 2023-04-26 (×7): qty 1

## 2023-04-26 MED ORDER — QUETIAPINE FUMARATE 25 MG PO TABS
25.0000 mg | ORAL_TABLET | Freq: Every day | ORAL | Status: DC
Start: 2023-04-26 — End: 2023-04-26

## 2023-04-26 NOTE — Consult Note (Signed)
PHARMACY CONSULT NOTE - ELECTROLYTES  Pharmacy Consult for Electrolyte Monitoring and Replacement   Recent Labs: Height: 5\' 5"  (165.1 cm) (Bilateral BKAs) Weight: 118 kg (260 lb 2.3 oz) IBW/kg (Calculated) : 61.5 Estimated Creatinine Clearance: 83.3 mL/min (by C-G formula based on SCr of 1.01 mg/dL).  Potassium (mmol/L)  Date Value  04/26/2023 5.1  07/12/2011 4.4   Magnesium (mg/dL)  Date Value  62/95/2841 2.4   Calcium (mg/dL)  Date Value  32/44/0102 9.3   Calcium, Total (mg/dL)  Date Value  72/53/6644 8.8   Albumin (g/dL)  Date Value  03/47/4259 2.4 (L)   Phosphorus (mg/dL)  Date Value  56/38/7564 4.2   Sodium (mmol/L)  Date Value  04/26/2023 134 (L)  07/12/2011 141   Assessment  [name] Montez Hageman. is a 67 y.o. male presenting with hyperkalemia, severe dehydration and AKI. PMH significant for atrial fibrillation, asthma, congestive heart failure, COPD, coronary artery disease, depression, diabetes mellitus, gout, hyperlipidemia, hypertension, hypogonadism in male, morbid obesity, peripheral vascular disease, OSA on CPAP at night, esophageal cancer status post chemo and G-tube placement. Pharmacy has been consulted to monitor and replace electrolytes.Pt is on amio, lasix 20 mg daily via tube,   Diet: Tube feeds MIVF: Free water 140 mL per tube 6x/day  Goal of Therapy: K+ > 4 Mg > 2  Plan:  No replacement needed. If K+ remains high 12/28, recommend starting lokelma daily for long term. While he was on lokelma 10 g daily, potassium did not fall below 4.5.  F/u with AM labs   Thank you for allowing pharmacy to be a part of this patient's care.  Ronnald Ramp, PharmD, BCPS 04/26/2023 8:04 AM

## 2023-04-26 NOTE — Progress Notes (Signed)
PROGRESS NOTE    Tymar Lowry Bowl.  XLK:[id number] DOB: May 03, 1955 DOA: 03/28/2023 PCP: [name], MD    Brief Narrative:   [name] is a 67 y.o. male with medical history significant of atrial fibrillation, asthma, congestive heart failure, COPD, coronary artery disease, depression, diabetes mellitus, gout, hyperlipidemia, hypertension, hypogonadism in male, morbid obesity, peripheral vascular disease, OSA on CPAP at night, esophageal cancer status post chemo and G-tube placement.  Patient currently being managed for hyperkalemia, severe dehydration and AKI and acute metabolic encephalopathy. MRI of the brain was negative, ammonia level was also negative. Patient was treated with [facility] for hypercalcemia.  Patient was also started on oral amiodarone for atrial flutter with RVR.  Followed by cardiology. 12/21.  Patient developed urinary retention again, Foley catheter was anchored after repeat I&O cath. 12/22.  Potassium still high, continued on Lokelma, also added a dose of Lasix, give a dose of albumin follow-up blood pressure.  Assessment & Plan:   Principal Problem:   Hyperkalemia Active Problems:   Delirium   Acute kidney injury (HCC)   Malnutrition of moderate degree   Acute hyperkalemia   Diarrhea   Perinephric fluid collection   Persistent atrial fibrillation (HCC)   Typical atrial flutter (HCC)   Diarrhea of infectious origin   Atrial flutter with rapid ventricular response (HCC)   Malignant neoplasm of lower third of esophagus (HCC)   Palliative care encounter   SOB (shortness of breath)  Acute on chronic persistent atrial flutter with rvr Intermittent hypotension. Patient underwent successful cardioversion 04/23/2023 Continue metoprolol Diltiazem was discontinued on 12/22 due to hypotension   Acute kidney injury secondary to dehydration Hyperkalemia. Hyponatremia. This is secondary to dehydration from diarrhea.  Renal function had  improved after fluids.  Hyperkalemia has improved.  Scheduled Lokelma discontinued   Delirium secondary to acute metabolic encephalopathy.-Improving Klebsiella UTI Patient urine culture was positive for Klebsiella at time of admission, completed antibiotics.  At this time I do not suspect patient's delirium to be infectious in nature.  Suspect hospital-acquired delirium in the setting of recent hospitalization.  This is unlikely to improve with continued hospitalization.  Patient will greatly benefit from hospital discharge and skilled nursing facility placement.  Will initiate nightly Seroquel for now.   Acute diarrhea secondary to C. difficile colitis. Patient is status post 10 days of Dificid followed with 10-day course of vancomycin.   Status post flexible sigmoidoscopy on 12/11.  Normal-appearing mucosa.  Biopsies taken.  Diarrhea has been improving as of 12/12 Diarrhea improving, but the patient still has some loose stools from tube feeding.   Continue as needed Imodium   Possible right perinephric hematoma --Described as possible abscess by radiology but per secure chat communication w/ Dr. Apolinar Junes on 12/9, this is a chornic complex hematoma dating back over a year, nothing that has been drainable thus far, possible underlying renal mass, and in light of significant comorbidities and as he is asymptomatic inpatient evaluation has been deferred Continue to follow-up with urologist as an outpatient     Urinary retention secondary to benign prostate hypertrophy. Patient developed significant urinary retention since last night with residual over 500 mL, patient had In-N-Out cath multiple times, Foley catheter was anchored again.   Chronic diastolic congestive heart failure --Recent EF of 55% on echo obtained on November 2024 No exacerbation.   COPD Continue as needed nebulization    Coronary artery disease,  Continue aspirin therapy   Diabetes mellitus type 2 with  hyperglycemia Continue Semglee 30 units nightly (previous dose 35 units)   Hypothyroidism Continue levothyroxine   Moderate protein calorie malnutrition. Nutrition Problem: Moderate Malnutrition Etiology: chronic illness (esopahgeal mass, likely malginant) Signs/Symptoms: mild fat depletion, mild muscle depletion Interventions: Tube feeding   Peripheral vascular disease S/p bilateral BKAs --Continue aspirin  --Patient is allergic to statin therapy   OSA on CPAP at night,  Continue CPAP   Esophageal cancer status post chemo and G-tube placement. Continue bolus tube feeding.  DVT prophylaxis: Apixaban Code Status: Full Family Communication: None today Disposition Plan: Status is: Inpatient Remains inpatient appropriate because: Unsafe discharge plan.  Pending insurance authorization for skilled nursing facility.   Level of care: Progressive  Consultants:  None  Procedures:  Cardioversion  Antimicrobials: None   Subjective: Seen and examined.  Resting in bed.  Answers all questions appropriately.  Objective: Vitals:   04/26/23 0000 04/26/23 0340 04/26/23 0500 04/26/23 0606  BP: 134/77 (!) 141/86  99/64  Pulse:    88  Resp: (!) 22 (!) 22  18  Temp:    (!) 97.5 F (36.4 C)  TempSrc:    Axillary  SpO2:    93%  Weight:   118 kg   Height:        Intake/Output Summary (Last 24 hours) at 04/26/2023 1416 Last data filed at 04/26/2023 0622 Gross per 24 hour  Intake 794 ml  Output 850 ml  Net -56 ml   Filed Weights   04/22/23 0346 04/24/23 0425 04/26/23 0500  Weight: 120.4 kg 118.7 kg 118 kg    Examination:  General exam: Appears calm and comfortable  Respiratory system: Bibasilar crackles.  Normal work of breathing.  Room air Cardiovascular system: S1-S2, RRR, no murmurs, no pedal edema Gastrointestinal system: Soft, NT/ND, + PEG tube Central nervous system: Alert and oriented. No focal neurological deficits. Extremities: Bilateral lower extremity  amputations Skin: No rashes, lesions or ulcers Psychiatry: Judgement and insight appear normal. Mood & affect appropriate.     Data Reviewed: I have personally reviewed following labs and imaging studies  CBC: Recent Labs  Lab 04/22/23 1005 04/23/23 0452 04/24/23 0616 04/25/23 0503 04/26/23 0602  WBC 10.2 10.0 13.9* 10.1 11.1*  NEUTROABS 7.9* 7.9* 11.5* 7.6 8.9*  HGB 11.2* 10.9* 11.4* 11.1* 11.3*  HCT 34.0* 33.3* 35.0* 35.0* 34.2*  MCV 89.2 88.6 90.0 92.6 89.8  PLT 256 264 294 254 248   Basic Metabolic Panel: Recent Labs  Lab 04/22/23 1005 04/23/23 0452 04/24/23 0616 04/25/23 0503 04/26/23 0602  NA 134* 132* 132* 131* 134*  K 4.9 4.7 5.3* 4.7 5.1  CL 99 96* 96* 93* 99  CO2 26 26 26 29 25   GLUCOSE 203* 179* 244* 180* 211*  BUN 35* 33* 29* 26* 26*  CREATININE 1.09 1.04 0.95 1.01 1.01  CALCIUM 8.9 8.9 9.3 9.2 9.3  MG  --   --  2.4  --   --   PHOS  --   --  4.2  --   --    GFR: Estimated Creatinine Clearance: 83.3 mL/min (by C-G formula based on SCr of 1.01 mg/dL). Liver Function Tests: No results for input(s): "AST", "ALT", "ALKPHOS", "BILITOT", "PROT", "ALBUMIN" in the last 168 hours. No results for input(s): "LIPASE", "AMYLASE" in the last 168 hours. No results for input(s): "AMMONIA" in the last 168 hours. Coagulation Profile: No results for input(s): "INR", "PROTIME" in the last 168 hours. Cardiac Enzymes: No results for input(s): "CKTOTAL", "CKMB", "CKMBINDEX", "TROPONINI" in the last  168 hours. BNP (last 3 results) No results for input(s): "PROBNP" in the last 8760 hours. HbA1C: No results for input(s): "HGBA1C" in the last 72 hours. CBG: Recent Labs  Lab 04/25/23 1150 04/25/23 1620 04/25/23 2142 04/26/23 0732 04/26/23 1208  GLUCAP 241* 214* 180* 217* 287*   Lipid Profile: No results for input(s): "CHOL", "HDL", "LDLCALC", "TRIG", "CHOLHDL", "LDLDIRECT" in the last 72 hours. Thyroid Function Tests: No results for input(s): "TSH", "T4TOTAL",  "FREET4", "T3FREE", "THYROIDAB" in the last 72 hours. Anemia Panel: No results for input(s): "VITAMINB12", "FOLATE", "FERRITIN", "TIBC", "IRON", "RETICCTPCT" in the last 72 hours. Sepsis Labs: No results for input(s): "PROCALCITON", "LATICACIDVEN" in the last 168 hours.  No results found for this or any previous visit (from the past 240 hours).       Radiology Studies: No results found.      Scheduled Meds:  [START ON 04/30/2023] amiodarone  200 mg Oral BID   Followed by   Melene Muller ON 05/07/2023] amiodarone  200 mg Oral Daily   amiodarone  400 mg Per Tube BID   apixaban  5 mg Per Tube BID   ascorbic acid  500 mg Per Tube Daily   Chlorhexidine Gluconate Cloth  6 each Topical Daily   cholecalciferol  2,000 Units Per Tube Daily   clonazepam  0.25 mg Per Tube BID   cyanocobalamin  10,000 mcg Per Tube Daily   ezetimibe  10 mg Per Tube Daily   feeding supplement (GLUCERNA 1.5 CAL)  237 mL Per Tube 6 X Daily   ferrous sulfate  300 mg Per Tube Daily   fiber supplement (BANATROL TF)  60 mL Per Tube TID   free water  140 mL Per Tube 6 X Daily   furosemide  20 mg Per Tube Daily   insulin aspart  0-5 Units Subcutaneous QHS   insulin aspart  0-9 Units Subcutaneous TID WC   insulin glargine-yfgn  40 Units Subcutaneous QHS   levothyroxine  25 mcg Per Tube Q0600   metoprolol succinate  25 mg Oral Daily   multivitamin with minerals  1 tablet Per Tube Daily   pramipexole  2 mg Per Tube QHS   primidone  250 mg Per Tube BID   QUEtiapine  25 mg Per Tube QHS   Continuous Infusions:   LOS: 29 days      Tresa Moore, MD [facility]   If 7PM-7AM, please contact night-coverage  04/26/2023, 2:16 PM

## 2023-04-26 NOTE — TOC Progression Note (Signed)
Transition of Care (TOC) - Progression Note    Patient Details  Name: [name]. MRN: [id number] Date of Birth: 05/09/1955  Transition of Care [facility]) CM/SW Contact  Garret Reddish, RN Phone Number: 04/26/2023, 4:51 PM  Clinical Narrative:    Chart reviewed.  I have spoken to [facility] with [employer].  She informs me that as far as she can tell patient has not used all his SNF days.    I have spoken to patient's wife and informed her on 04-17-23 insurance company denied patient's request for SNF.  Mrs. [name] reports that she did not submit an appeal.  She informed that she spoke with the insurance  company and they informed her that patient has not used all his SNF days and they are not seeing a  SNF denial.  I have informed Mrs. [name] TOC will submit a new SNF request.    TOC will continue to follow for discharge planning.          Expected Discharge Plan and Services                                               Social Determinants of Health (SDOH) Interventions SDOH Screenings   Food Insecurity: No Food Insecurity (03/29/2023)  Housing: Low Risk  (03/29/2023)  Transportation Needs: No Transportation Needs (03/29/2023)  Utilities: Not At Risk (03/29/2023)  Depression (PHQ2-9): Low Risk  (11/21/2021)  Financial Resource Strain: Low Risk  (09/28/2022)   Received from [facility], [facility]  Tobacco Use: Medium Risk (04/10/2023)    Readmission Risk Interventions    02/13/2022    4:57 PM 09/29/2021   11:12 AM  Readmission Risk Prevention Plan  Transportation Screening Complete Complete  PCP or Specialist Appt within 3-5 Days  Complete  HRI or Home Care Consult  Complete  Social Work Consult for Recovery Care Planning/Counseling  Complete  Palliative Care Screening  Not Applicable  Medication Review Oceanographer) Complete Complete  PCP or Specialist appointment within 3-5 days of discharge Complete    HRI or Home Care Consult Complete   SW Recovery Care/Counseling Consult Complete   Palliative Care Screening Not Applicable   Skilled Nursing Facility Not Applicable

## 2023-04-26 NOTE — Inpatient Diabetes Management (Signed)
Inpatient Diabetes Program Recommendations  AACE/ADA: New Consensus Statement on Inpatient Glycemic Control   Target Ranges:  Prepandial:   less than 140 mg/dL      Peak postprandial:   less than 180 mg/dL (1-2 hours)      Critically ill patients:  140 - 180 mg/dL    Latest Reference Range & Units 04/25/23 08:10 04/25/23 11:50 04/25/23 16:20 04/25/23 21:42 04/26/23 07:32  Glucose-Capillary 70 - 99 mg/dL 161 (H) 096 (H) 045 (H) 180 (H) 217 (H)   Review of Glycemic Control  Diabetes history: DM2 Outpatient Diabetes medications: Lantus 50 units BID, Humalog 15 units TID with meals Current orders for Inpatient glycemic control: Semglee 35 units at bedtime, Novolog 0-9 units TID with meals, Novolog 0-5 units QHS  Inpatient Diabetes Program Recommendations:    Insulin: Please consider ordering Novolog 3 units TID with meals for meal coverage if patient eats at least 50% of meals.  Thanks, Orlando Penner, RN, MSN, CDCES Diabetes Coordinator Inpatient Diabetes Program [phone] (Team Pager from 8am to 5pm)

## 2023-04-26 NOTE — Progress Notes (Signed)
At approx 0335 pt found to be setting be alarm off in attempt to exit bed. Reorientation unsuccessful, with pt verbally threatening this RN with "cutting" if he's touched. Pt attempting to pull bedside monitor and other articles into bed. BP and HR obtained from monitor currently attached to pt but he is otherwise refusing all VS assessment from this author and NT, Jojo. PRN Risperidone per tube given for pt's agitation and awaiting its effects. Cliffton Asters, provider on-call updated on pt condition via page. Monitor placed as far away from pt as possible. Will continue to monitor.

## 2023-04-26 NOTE — Plan of Care (Signed)

## 2023-04-26 NOTE — Progress Notes (Signed)
Occupational Therapy Re-Evaluation  Patient Details Name: [name]. MRN: [id number] DOB: May 24, 1955 Today's Date: 04/26/2023   History of present illness Pt is a 67 yo male pt admitted for hyperkalemia, AKI, UTI, and perinephric abscess. PMH includes Afib, asthma, CHF, COPD, CAD, depression, PVD, and esophageal cancer. Currently with PEG tube placed. Of note B LE BKA.   OT comments  Chart reviewed to date, pt greeted in room, reporting incontinent of BM. Pt is alert, oriented to self and place, not oriented to time/situation.  Unable to be re-oriented throughout session despite attempts but pt is pleasant and agreeable throughout. Shades lifted and lights turned on to facilitate continued alertness. Re-evaluation completed with goals updated to reflect current level of functioning. Tx session targeted improving functional mobility and participation in ADL tasks. MOD A required for multiple rolls in bed with frequent vcs for technique, TOTAL A for peri care. Pt is fatigued after toileting attempts therefore further mobility attempts deferred on this date. Pt will continue to benefit from skilled OT, OT will continue to follow acutely.       If plan is discharge home, recommend the following:  A lot of help with walking and/or transfers;A lot of help with bathing/dressing/bathroom   Equipment Recommendations  Hospital bed    Recommendations for Other Services      Precautions / Restrictions Precautions Precautions: Fall Precaution Comments: BLE BKA, no prosthetics in room Restrictions Weight Bearing Restrictions Per Provider Order: No RLE Weight Bearing Per Provider Order: Weight bearing as tolerated LLE Weight Bearing Per Provider Order: Weight bearing as tolerated       Mobility Bed Mobility Overal bed mobility: Needs Assistance Bed Mobility: Rolling Rolling: Mod assist, Used rails (multimodal step by step cues for initiation)              Transfers                    General transfer comment: unable to attempt on this date- pt reports fatigue after rolling multiple attempts for clean up due to BM     Balance       Sitting balance - Comments: Long sit in bed with supervision                                   ADL either performed or assessed with clinical judgement   ADL Overall ADL's : Needs assistance/impaired                 Upper Body Dressing : Moderate assistance;Sitting   Lower Body Dressing: Maximal assistance;Bed level       Toileting- Clothing Manipulation and Hygiene: Total assistance;+2 for physical assistance;+2 for safety/equipment;Bed level Toileting - Clothing Manipulation Details (indicate cue type and reason): incontinent of BM in bed;            Extremity/Trunk Assessment              Vision       Perception     Praxis      Cognition Arousal: Alert Behavior During Therapy: WFL for tasks assessed/performed Overall Cognitive Status: Impaired/Different from baseline Area of Impairment: Orientation, Following commands, Safety/judgement, Memory, Awareness                 Orientation Level: Disoriented to, Time, Situation (states he was working yesterday) Current Attention Level: Focused Memory: Decreased short-term memory Following Commands: Follows one  step commands with increased time Safety/Judgement: Decreased awareness of safety, Decreased awareness of deficits Awareness: Intellectual Problem Solving: Requires verbal cues, Requires tactile cues, Slow processing General Comments: Pt cognition waxes and wanes, reports what his HEP was when going to outpatient PT but then states he was at work yesterday        Exercises Other Exercises Other Exercises: Edu re importance of continued stretching/ movement of hamstring to prevent contrature    Shoulder Instructions       General Comments vss throughout    Pertinent Vitals/ Pain       Pain Assessment Pain  Assessment: No/denies pain  Home Living                                          Prior Functioning/Environment              Frequency  Min 1X/week        Progress Toward Goals  OT Goals(current goals can now be found in the care plan section)  Progress towards OT goals: Progressing toward goals  Acute Rehab OT Goals Time For Goal Achievement: 05/10/23  Plan      Co-evaluation                 AM-PAC OT "6 Clicks" Daily Activity     Outcome Measure   Help from another person eating meals?: None Help from another person taking care of personal grooming?: A Lot Help from another person toileting, which includes using toliet, bedpan, or urinal?: A Lot Help from another person bathing (including washing, rinsing, drying)?: A Lot Help from another person to put on and taking off regular upper body clothing?: None Help from another person to put on and taking off regular lower body clothing?: A Lot 6 Click Score: 16    End of Session    OT Visit Diagnosis: Muscle weakness (generalized) (M62.81);Other symptoms and signs involving cognitive function   Activity Tolerance Patient tolerated treatment well   Patient Left in bed;with call bell/phone within reach;with bed alarm set;with nursing/sitter in room   Nurse Communication Mobility status        Time: 1344-1416 OT Time Calculation (min): 32 min  Charges: OT General Charges $OT Visit: 1 Visit OT Evaluation $OT Re-eval: 1 Re-eval OT Treatments $Self Care/Home Management : 23-37 mins Oleta Mouse, OTD OTR/L  04/26/23, 3:30 PM
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
## 2023-04-26 NOTE — Progress Notes (Signed)
2.5mg  IM Haldol given per order to pt's R Deltoid with assistance of Charge RN, Mining engineer. Will continue to monitor.

## 2023-04-27 DIAGNOSIS — E875 Hyperkalemia: Secondary | ICD-10-CM | POA: Diagnosis not present

## 2023-04-27 LAB — CBC WITH DIFFERENTIAL/PLATELET
Abs Immature Granulocytes: 0.06 10*3/uL (ref 0.00–0.07)
Basophils Absolute: 0.1 10*3/uL (ref 0.0–0.1)
Basophils Relative: 1 %
Eosinophils Absolute: 0.3 10*3/uL (ref 0.0–0.5)
Eosinophils Relative: 3 %
HCT: 35 % — ABNORMAL LOW (ref 39.0–52.0)
Hemoglobin: 11.4 g/dL — ABNORMAL LOW (ref 13.0–17.0)
Immature Granulocytes: 1 %
Lymphocytes Relative: 13 %
Lymphs Abs: 1.5 10*3/uL (ref 0.7–4.0)
MCH: 29.9 pg (ref 26.0–34.0)
MCHC: 32.6 g/dL (ref 30.0–36.0)
MCV: 91.9 fL (ref 80.0–100.0)
Monocytes Absolute: 0.7 10*3/uL (ref 0.1–1.0)
Monocytes Relative: 6 %
Neutro Abs: 9.1 10*3/uL — ABNORMAL HIGH (ref 1.7–7.7)
Neutrophils Relative %: 76 %
Platelets: 280 10*3/uL (ref 150–400)
RBC: 3.81 MIL/uL — ABNORMAL LOW (ref 4.22–5.81)
RDW: 14.9 % (ref 11.5–15.5)
WBC: 11.8 10*3/uL — ABNORMAL HIGH (ref 4.0–10.5)
nRBC: 0 % (ref 0.0–0.2)

## 2023-04-27 LAB — BASIC METABOLIC PANEL
Anion gap: 9 (ref 5–15)
BUN: 32 mg/dL — ABNORMAL HIGH (ref 8–23)
CO2: 26 mmol/L (ref 22–32)
Calcium: 9.1 mg/dL (ref 8.9–10.3)
Chloride: 99 mmol/L (ref 98–111)
Creatinine, Ser: 1.05 mg/dL (ref 0.61–1.24)
GFR, Estimated: >60 mL/min (ref >60)
Glucose, Bld: 160 mg/dL — ABNORMAL HIGH (ref 70–99)
Potassium: 4.9 mmol/L (ref 3.5–5.1)
Sodium: 134 mmol/L — ABNORMAL LOW (ref 135–145)

## 2023-04-27 LAB — GLUCOSE, CAPILLARY
Glucose-Capillary: 169 mg/dL — ABNORMAL HIGH (ref 70–99)
Glucose-Capillary: 202 mg/dL — ABNORMAL HIGH (ref 70–99)
Glucose-Capillary: 208 mg/dL — ABNORMAL HIGH (ref 70–99)
Glucose-Capillary: 150 mg/dL — ABNORMAL HIGH (ref 70–99)
Glucose-Capillary: 177 mg/dL — ABNORMAL HIGH (ref 70–99)

## 2023-04-27 LAB — PROCALCITONIN: Procalcitonin: <0.1 ng/mL

## 2023-04-27 MED ORDER — ZIPRASIDONE MESYLATE 20 MG IM SOLR
10.0000 mg | Freq: Once | INTRAMUSCULAR | Status: DC
  Filled 2023-04-27: qty 20

## 2023-04-27 MED ORDER — OLANZAPINE 5 MG PO TBDP
15.0000 mg | ORAL_TABLET | Freq: Once | ORAL | Status: AC
  Administered 2023-04-27: 15 mg via ORAL
  Filled 2023-04-27: qty 1

## 2023-04-27 NOTE — Consult Note (Signed)
PHARMACY CONSULT NOTE - ELECTROLYTES  Pharmacy Consult for Electrolyte Monitoring and Replacement   Recent Labs: Height: 5\' 5"  (165.1 cm) (Bilateral BKAs) Weight: 115.8 kg (255 lb 4.7 oz) IBW/kg (Calculated) : 61.5 Estimated Creatinine Clearance: 79.2 mL/min (by C-G formula based on SCr of 1.05 mg/dL).  Potassium (mmol/L)  Date Value  04/27/2023 4.9  07/12/2011 4.4   Magnesium (mg/dL)  Date Value  40/01/2724 2.4   Calcium (mg/dL)  Date Value  36/64/4034 9.1   Calcium, Total (mg/dL)  Date Value  74/25/9563 8.8   Albumin (g/dL)  Date Value  87/56/4332 2.4 (L)   Phosphorus (mg/dL)  Date Value  95/18/8416 4.2   Sodium (mmol/L)  Date Value  04/27/2023 134 (L)  07/12/2011 141   Assessment  Alec A Mcfarland Montez Hageman. is a 67 y.o. male presenting with hyperkalemia, severe dehydration and AKI. PMH significant for atrial fibrillation, asthma, congestive heart failure, COPD, coronary artery disease, depression, diabetes mellitus, gout, hyperlipidemia, hypertension, hypogonadism in male, morbid obesity, peripheral vascular disease, OSA on CPAP at night, esophageal cancer status post chemo and G-tube placement. Pharmacy has been consulted to monitor and replace electrolytes.Pt is on amio, lasix 20 mg daily via tube,   Diet: Tube feeds MIVF: Free water 140 mL per tube 6x/day  Goal of Therapy: K+ > 4 Mg > 2  Medications:  lasix 20 mg per tube daily  Plan:  K 4.9   No replacement needed.  If K+ remains high 12/28, recommend starting lokelma daily for long term. While he was on lokelma 10 g daily, potassium did not fall below 4.5.  F/u with AM labs   Thank you for allowing pharmacy to be a part of this patient's care.  Raider Valbuena A, PharmD 04/27/2023 2:44 PM

## 2023-04-27 NOTE — Progress Notes (Signed)
PROGRESS NOTE    [name].  ZOX:[id number] DOB: 29-Jan-1956 DOA: 03/28/2023 PCP: Alec Arbour, MD    Brief Narrative:   [name]. is a 67 y.o. male with medical history significant of atrial fibrillation, asthma, congestive heart failure, COPD, coronary artery disease, depression, diabetes mellitus, gout, hyperlipidemia, hypertension, hypogonadism in male, morbid obesity, peripheral vascular disease, OSA on CPAP at night, esophageal cancer status post chemo and G-tube placement.  Patient currently being managed for hyperkalemia, severe dehydration and AKI and acute metabolic encephalopathy. MRI of the brain was negative, ammonia level was also negative. Patient was treated with [facility] for hypercalcemia.  Patient was also started on oral amiodarone for atrial flutter with RVR.  Followed by cardiology. 12/21.  Patient developed urinary retention again, Foley catheter was anchored after repeat I&O cath. 12/22.  Potassium still high, continued on Lokelma, also added a dose of Lasix, give a dose of albumin follow-up blood pressure.  Assessment & Plan:   Principal Problem:   Hyperkalemia Active Problems:   Delirium   Acute kidney injury (HCC)   Malnutrition of moderate degree   Acute hyperkalemia   Diarrhea   Perinephric fluid collection   Persistent atrial fibrillation (HCC)   Typical atrial flutter (HCC)   Diarrhea of infectious origin   Atrial flutter with rapid ventricular response (HCC)   Malignant neoplasm of lower third of esophagus (HCC)   Palliative care encounter   SOB (shortness of breath)  Acute on chronic persistent atrial flutter with rvr Intermittent hypotension. Patient underwent successful cardioversion 04/23/2023 Continue metoprolol Diltiazem was discontinued on 12/22 due to hypotension Remains in NSR   Acute kidney injury secondary to dehydration Hyperkalemia. Hyponatremia. This is secondary to dehydration from diarrhea.  Renal  function had improved after fluids.  Hyperkalemia has improved.  Scheduled Lokelma discontinued   Delirium secondary to acute metabolic encephalopathy.-Improving Klebsiella UTI Patient urine culture was positive for Klebsiella at time of admission, completed antibiotics.  At this time I do not suspect patient's delirium to be infectious in nature.  Suspect hospital-acquired delirium in the setting of recent hospitalization.  This is unlikely to improve with continued hospitalization.  Patient will greatly benefit from hospital discharge and skilled nursing facility placement.  S Seroquel nightly.  IM Geodon as needed agitation.  Avoid Haldol.  Avoid benzodiazepines   Acute diarrhea secondary to C. difficile colitis. Patient is status post 10 days of Dificid followed with 10-day course of vancomycin.   Status post flexible sigmoidoscopy on 12/11.  Normal-appearing mucosa.  Biopsies taken.  Diarrhea has been improving as of 12/12 Diarrhea improving, but the patient still has some loose stools from tube feeding.   Continue as needed Imodium   Possible right perinephric hematoma --Described as possible abscess by radiology but per secure chat communication w/ Dr. Apolinar Wood on 12/9, this is a chornic complex hematoma dating back over a year, nothing that has been drainable thus far, possible underlying renal mass, and in light of significant comorbidities and as he is asymptomatic inpatient evaluation has been deferred Continue to follow-up with urologist as an outpatient     Urinary retention secondary to benign prostate hypertrophy. Patient developed significant urinary retention since last night with residual over 500 mL, patient had In-N-Out cath multiple times, Foley catheter was anchored again.  Monitor UOP   Chronic diastolic congestive heart failure --Recent EF of 55% on echo obtained on November 2024 No exacerbation.   COPD Continue as needed nebulization  Coronary artery disease,   Continue aspirin therapy   Diabetes mellitus type 2 with hyperglycemia Continue Semglee 30 units nightly (previous dose 35 units)   Hypothyroidism Continue levothyroxine   Moderate protein calorie malnutrition. Nutrition Problem: Moderate Malnutrition Etiology: chronic illness (esopahgeal mass, likely malginant) Signs/Symptoms: mild fat depletion, mild muscle depletion Interventions: Tube feeding   Peripheral vascular disease S/p bilateral BKAs --Continue aspirin  --Patient is allergic to statin therapy   OSA on CPAP at night,  Continue CPAP   Esophageal cancer status post chemo and G-tube placement. Continue bolus tube feeding.  DVT prophylaxis: Apixaban Code Status: Full Family Communication: [name] via phone [phone] on 12/28 Disposition Plan: Status is: Inpatient Remains inpatient appropriate because: Unsafe discharge plan.  Pending insurance authorization for skilled nursing facility.   Level of care: Progressive  Consultants:  None  Procedures:  Cardioversion  Antimicrobials: None   Subjective: Seen and examined.  Resting in bed.  Agitation noted last night.  Objective: Vitals:   04/26/23 2246 04/27/23 0445 04/27/23 0810 04/27/23 1154  BP: 107/66  91/65 (!) 95/57  Pulse: 71   100  Resp: 16  14 20   Temp: 97.8 F (36.6 C)  98.3 F (36.8 C) 98.2 F (36.8 C)  TempSrc:   Oral Oral  SpO2: 95%  93% 94%  Weight:  115.8 kg    Height:        Intake/Output Summary (Last 24 hours) at 04/27/2023 1501 Last data filed at 04/27/2023 0820 Gross per 24 hour  Intake --  Output 400 ml  Net -400 ml   Filed Weights   04/24/23 0425 04/26/23 0500 04/27/23 0445  Weight: 118.7 kg 118 kg 115.8 kg    Examination:  General exam: Appears fatigued Respiratory system: Bibasilar crackles.  Normal work of breathing.  Room air Cardiovascular system: S1-S2, RRR, no murmurs, no pedal edema Gastrointestinal system: Soft, NT/ND, + PEG tube Central nervous  system: Alert.  Oriented x 2.  No focal deficits Extremities: Bilateral lower extremity amputations Skin: No rashes, lesions or ulcers Psychiatry: Judgement and insight appear normal. Mood & affect appropriate.     Data Reviewed: I have personally reviewed following labs and imaging studies  CBC: Recent Labs  Lab 04/23/23 0452 04/24/23 0616 04/25/23 0503 04/26/23 0602 04/27/23 1004  WBC 10.0 13.9* 10.1 11.1* 11.8*  NEUTROABS 7.9* 11.5* 7.6 8.9* 9.1*  HGB 10.9* 11.4* 11.1* 11.3* 11.4*  HCT 33.3* 35.0* 35.0* 34.2* 35.0*  MCV 88.6 90.0 92.6 89.8 91.9  PLT 264 294 254 248 280   Basic Metabolic Panel: Recent Labs  Lab 04/23/23 0452 04/24/23 0616 04/25/23 0503 04/26/23 0602 04/27/23 1004  NA 132* 132* 131* 134* 134*  K 4.7 5.3* 4.7 5.1 4.9  CL 96* 96* 93* 99 99  CO2 26 26 29 25 26   GLUCOSE 179* 244* 180* 211* 160*  BUN 33* 29* 26* 26* 32*  CREATININE 1.04 0.95 1.01 1.01 1.05  CALCIUM 8.9 9.3 9.2 9.3 9.1  MG  --  2.4  --   --   --   PHOS  --  4.2  --   --   --    GFR: Estimated Creatinine Clearance: 79.2 mL/min (by C-G formula based on SCr of 1.05 mg/dL). Liver Function Tests: No results for input(s): "AST", "ALT", "ALKPHOS", "BILITOT", "PROT", "ALBUMIN" in the last 168 hours. No results for input(s): "LIPASE", "AMYLASE" in the last 168 hours. No results for input(s): "AMMONIA" in the last 168 hours. Coagulation Profile: No results for  input(s): "INR", "PROTIME" in the last 168 hours. Cardiac Enzymes: No results for input(s): "CKTOTAL", "CKMB", "CKMBINDEX", "TROPONINI" in the last 168 hours. BNP (last 3 results) No results for input(s): "PROBNP" in the last 8760 hours. HbA1C: No results for input(s): "HGBA1C" in the last 72 hours. CBG: Recent Labs  Lab 04/26/23 1719 04/26/23 2013 04/26/23 2249 04/27/23 0826 04/27/23 1146  GLUCAP 237* 227* 194* 169* 202*   Lipid Profile: No results for input(s): "CHOL", "HDL", "LDLCALC", "TRIG", "CHOLHDL", "LDLDIRECT" in  the last 72 hours. Thyroid Function Tests: No results for input(s): "TSH", "T4TOTAL", "FREET4", "T3FREE", "THYROIDAB" in the last 72 hours. Anemia Panel: No results for input(s): "VITAMINB12", "FOLATE", "FERRITIN", "TIBC", "IRON", "RETICCTPCT" in the last 72 hours. Sepsis Labs: No results for input(s): "PROCALCITON", "LATICACIDVEN" in the last 168 hours.  No results found for this or any previous visit (from the past 240 hours).       Radiology Studies: No results found.      Scheduled Meds:  [START ON 04/30/2023] amiodarone  200 mg Oral BID   Followed by   Melene Muller ON 05/07/2023] amiodarone  200 mg Oral Daily   amiodarone  400 mg Per Tube BID   apixaban  5 mg Per Tube BID   ascorbic acid  500 mg Per Tube Daily   Chlorhexidine Gluconate Cloth  6 each Topical Daily   cholecalciferol  2,000 Units Per Tube Daily   clonazepam  0.25 mg Per Tube BID   cyanocobalamin  10,000 mcg Per Tube Daily   ezetimibe  10 mg Per Tube Daily   feeding supplement (GLUCERNA 1.5 CAL)  237 mL Per Tube 6 X Daily   ferrous sulfate  300 mg Per Tube Daily   fiber supplement (BANATROL TF)  60 mL Per Tube TID   free water  140 mL Per Tube 6 X Daily   furosemide  20 mg Per Tube Daily   insulin aspart  0-5 Units Subcutaneous QHS   insulin aspart  0-9 Units Subcutaneous TID WC   insulin glargine-yfgn  40 Units Subcutaneous QHS   levothyroxine  25 mcg Per Tube Q0600   metoprolol succinate  25 mg Oral Daily   multivitamin with minerals  1 tablet Per Tube Daily   pramipexole  2 mg Per Tube QHS   primidone  250 mg Per Tube BID   QUEtiapine  25 mg Per Tube QHS   ziprasidone  10 mg Intramuscular Once   Continuous Infusions:   LOS: 30 days      Tresa Moore, MD [facility]   If 7PM-7AM, please contact night-coverage  04/27/2023, 3:01 PM

## 2023-04-27 NOTE — Progress Notes (Signed)
During my rounds at 1AM, saw pt sitting at the edge of the bed, pushing himself off the bed. Staff helped me get the pt to lie back in bed. He was grabbing at swinging at [place]. Pt even threw at pillow at me. Then pt started grabbing the monitor, the oxygen cord was wrapped around his neck. I was able to unwrapped the cord from his neck but then he started swinging the cord at me. PRN Risperidone given via G tube. Took 3 nurses to give the medicine. [facility] told me that pt's wife got upset that he was given Haldol last night. NP Jon Billings made aware via secure chat.

## 2023-04-27 NOTE — Progress Notes (Signed)
1 dose of Zyprexa 15 mg given via G tube. Pt is still swinging cords and the telephone at staff.

## 2023-04-27 NOTE — Plan of Care (Signed)

## 2023-04-28 DIAGNOSIS — E875 Hyperkalemia: Secondary | ICD-10-CM | POA: Diagnosis not present

## 2023-04-28 LAB — GLUCOSE, CAPILLARY
Glucose-Capillary: 222 mg/dL — ABNORMAL HIGH (ref 70–99)
Glucose-Capillary: 216 mg/dL — ABNORMAL HIGH (ref 70–99)
Glucose-Capillary: 228 mg/dL — ABNORMAL HIGH (ref 70–99)
Glucose-Capillary: 211 mg/dL — ABNORMAL HIGH (ref 70–99)

## 2023-04-28 LAB — BASIC METABOLIC PANEL
Anion gap: 10 (ref 5–15)
BUN: 33 mg/dL — ABNORMAL HIGH (ref 8–23)
CO2: 26 mmol/L (ref 22–32)
Calcium: 9.2 mg/dL (ref 8.9–10.3)
Chloride: 98 mmol/L (ref 98–111)
Creatinine, Ser: 1.16 mg/dL (ref 0.61–1.24)
GFR, Estimated: >60 mL/min (ref >60)
Glucose, Bld: 165 mg/dL — ABNORMAL HIGH (ref 70–99)
Potassium: 4.9 mmol/L (ref 3.5–5.1)
Sodium: 134 mmol/L — ABNORMAL LOW (ref 135–145)

## 2023-04-28 MED ORDER — ZIPRASIDONE MESYLATE 20 MG IM SOLR
10.0000 mg | Freq: Every day | INTRAMUSCULAR | Status: DC | PRN
  Administered 2023-04-28 – 2023-05-01 (×3): 10 mg via INTRAMUSCULAR
  Filled 2023-04-28 (×5): qty 20

## 2023-04-28 MED ORDER — DIPHENOXYLATE-ATROPINE 2.5-0.025 MG PO TABS
1.0000 | ORAL_TABLET | Freq: Four times a day (QID) | ORAL | Status: DC
  Administered 2023-04-28 – 2023-05-03 (×21): 1
  Filled 2023-04-28 (×21): qty 1

## 2023-04-28 MED ORDER — METOPROLOL TARTRATE 25 MG PO TABS
12.5000 mg | ORAL_TABLET | Freq: Two times a day (BID) | ORAL | Status: DC
  Administered 2023-04-28 – 2023-05-03 (×10): 12.5 mg via ORAL
  Filled 2023-04-28 (×11): qty 1

## 2023-04-28 MED ORDER — FREE WATER
30.0000 mL | Freq: Every day | Status: DC
  Administered 2023-04-28 – 2023-05-03 (×26): 30 mL

## 2023-04-28 NOTE — Plan of Care (Signed)
°  Problem: Education: Goal: Ability to describe self-care measures that may prevent or decrease complications (Diabetes Survival Skills Education) will improve Outcome: Not Progressing   Problem: Coping: Goal: Ability to adjust to condition or change in health will improve Outcome: Not Progressing   Problem: Fluid Volume: Goal: Ability to maintain a balanced intake and output will improve Outcome: Progressing   Problem: Health Behavior/Discharge Planning: Goal: Ability to identify and utilize available resources and services will improve Outcome: Not Progressing   Problem: Metabolic: Goal: Ability to maintain appropriate glucose levels will improve Outcome: Progressing   Problem: Nutritional: Goal: Maintenance of adequate nutrition will improve Outcome: Not Progressing Goal: Progress toward achieving an optimal weight will improve Outcome: Not Progressing   Problem: Skin Integrity: Goal: Risk for impaired skin integrity will decrease Outcome: Not Progressing   Problem: Education: Goal: Knowledge of General Education information will improve Description: Including pain rating scale, medication(s)/side effects and non-pharmacologic comfort measures Outcome: Not Progressing   Problem: Health Behavior/Discharge Planning: Goal: Ability to manage health-related needs will improve Outcome: Not Progressing   Problem: Clinical Measurements: Goal: Ability to maintain clinical measurements within normal limits will improve Outcome: Not Progressing Goal: Will remain free from infection Outcome: Not Progressing Goal: Diagnostic test results will improve Outcome: Not Progressing Goal: Respiratory complications will improve Outcome: Not Progressing Goal: Cardiovascular complication will be avoided Outcome: Not Progressing   Problem: Activity: Goal: Risk for activity intolerance will decrease Outcome: Not Progressing   Problem: Nutrition: Goal: Adequate nutrition will be  maintained Outcome: Not Progressing   Problem: Coping: Goal: Level of anxiety will decrease Outcome: Not Progressing   Problem: Elimination: Goal: Will not experience complications related to bowel motility Outcome: Not Progressing Goal: Will not experience complications related to urinary retention Outcome: Not Progressing   Problem: Pain Management: Goal: General experience of comfort will improve Outcome: Progressing   Problem: Safety: Goal: Ability to remain free from injury will improve Outcome: Not Progressing   Problem: Skin Integrity: Goal: Risk for impaired skin integrity will decrease Outcome: Not Progressing

## 2023-04-28 NOTE — Progress Notes (Signed)
PROGRESS NOTE    [name].  WGN:[id number] DOB: 1955/05/19 DOA: 03/28/2023 PCP: Alec Arbour, MD    Brief Narrative:   [name]. is a 67 y.o. male with medical history significant of atrial fibrillation, asthma, congestive heart failure, COPD, coronary artery disease, depression, diabetes mellitus, gout, hyperlipidemia, hypertension, hypogonadism in male, morbid obesity, peripheral vascular disease, OSA on CPAP at night, esophageal cancer status post chemo and G-tube placement.  Patient currently being managed for hyperkalemia, severe dehydration and AKI and acute metabolic encephalopathy. MRI of the brain was negative, ammonia level was also negative. Patient was treated with [facility] for hypercalcemia.  Patient was also started on oral amiodarone for atrial flutter with RVR.  Followed by cardiology. 12/21.  Patient developed urinary retention again, Foley catheter was anchored after repeat I&O cath. 12/22.  Potassium still high, continued on Lokelma, also added a dose of Lasix, give a dose of albumin follow-up blood pressure.  Assessment & Plan:   Principal Problem:   Hyperkalemia Active Problems:   Delirium   Acute kidney injury (HCC)   Malnutrition of moderate degree   Acute hyperkalemia   Diarrhea   Perinephric fluid collection   Persistent atrial fibrillation (HCC)   Typical atrial flutter (HCC)   Diarrhea of infectious origin   Atrial flutter with rapid ventricular response (HCC)   Malignant neoplasm of lower third of esophagus (HCC)   Palliative care encounter   SOB (shortness of breath)  Acute on chronic persistent atrial flutter with rvr Intermittent hypotension. Patient underwent successful cardioversion 04/23/2023 Continue metoprolol Diltiazem was discontinued on 12/22 due to hypotension Remains in NSR   Acute kidney injury secondary to dehydration Hyperkalemia. Hyponatremia. This is secondary to dehydration from diarrhea.  Renal  function had improved after fluids.  Hyperkalemia has improved.  Scheduled Lokelma discontinued   Delirium secondary to acute metabolic encephalopathy.-Improving Klebsiella UTI Patient urine culture was positive for Klebsiella at time of admission, completed antibiotics.  At this time I do not suspect patient's delirium to be infectious in nature.  Suspect hospital-acquired delirium in the setting of recent hospitalization.  This is unlikely to improve with continued hospitalization.  Patient will greatly benefit from hospital discharge and skilled nursing facility placement.  Continue low-dose Seroquel nightly.  IM Geodon as needed agitation.  Avoid Haldol.  Avoid benzodiazepines. -Would greatly benefit patient to be able to exit his room in a wheelchair and care of nursing staff or the patient's wife.    Acute diarrhea secondary to C. difficile colitis. Patient is status post 10 days of Dificid followed with 10-day course of vancomycin.   Status post flexible sigmoidoscopy on 12/11.  Normal-appearing mucosa.  Biopsies taken.  Diarrhea has been improving as of 12/12 Diarrhea improving, but the patient still has some loose stools from tube feeding.   Continue as needed Imodium.  Start scheduled Lomotil 4 times daily for now   Possible right perinephric hematoma --Described as possible abscess by radiology but per secure chat communication w/ Dr. Apolinar Junes on 12/9, this is a chornic complex hematoma dating back over a year, nothing that has been drainable thus far, possible underlying renal mass, and in light of significant comorbidities and as he is asymptomatic inpatient evaluation has been deferred Continue to follow-up with urologist as an outpatient     Urinary retention secondary to benign prostate hypertrophy. Patient developed significant urinary retention since last night with residual over 500 mL, patient had In-N-Out cath multiple times, Foley catheter was  anchored again.  Monitor UOP    Chronic diastolic congestive heart failure --Recent EF of 55% on echo obtained on November 2024 No exacerbation.   COPD Continue as needed nebulization    Coronary artery disease,  Continue aspirin therapy   Diabetes mellitus type 2 with hyperglycemia Continue Semglee 30 units nightly (previous dose 35 units)   Hypothyroidism Continue levothyroxine   Moderate protein calorie malnutrition. Nutrition Problem: Moderate Malnutrition Etiology: chronic illness (esopahgeal mass, likely malginant) Signs/Symptoms: mild fat depletion, mild muscle depletion Interventions: Tube feeding   Peripheral vascular disease S/p bilateral BKAs --Continue aspirin  --Patient is allergic to statin therapy   OSA on CPAP at night,  Continue CPAP   Esophageal cancer status post chemo and G-tube placement. Continue bolus tube feeding.  DVT prophylaxis: Apixaban Code Status: Full Family Communication: [name] via phone [phone] on 12/28 Disposition Plan: Status is: Inpatient Remains inpatient appropriate because: Unsafe discharge plan.  Pending insurance authorization for skilled nursing facility.   Level of care: Progressive  Consultants:  None  Procedures:  Cardioversion  Antimicrobials: None   Subjective: Seen and examined.  Resting in bed.  Agitation last night.  Some hallucinations this morning.  Objective: Vitals:   04/27/23 2010 04/27/23 2338 04/28/23 0406 04/28/23 0800  BP: (!) 123/96 116/87 120/86 127/74  Pulse: (!) 102 96 97 (!) 104  Resp: 18 19 16 16   Temp: 97.9 F (36.6 C) 98.1 F (36.7 C) 97.9 F (36.6 C) 97.7 F (36.5 C)  TempSrc: Oral Oral Oral Oral  SpO2: 96% 95% 96% 92%  Weight:      Height:        Intake/Output Summary (Last 24 hours) at 04/28/2023 1155 Last data filed at 04/28/2023 1000 Gross per 24 hour  Intake 487 ml  Output 575 ml  Net -88 ml   Filed Weights   04/24/23 0425 04/26/23 0500 04/27/23 0445  Weight: 118.7 kg 118 kg 115.8 kg     Examination:  General exam: Appears fatigued.  Disorganized thought pattern Respiratory system: Crackles at bases bilaterally.  Normal work of breathing.  2 L Cardiovascular system: S1-S2, RRR, no murmurs, no pedal edema Gastrointestinal system: Soft, NT/ND, + PEG tube Central nervous system: Alert.  Oriented x 2.  No focal deficits Extremities: Bilateral lower extremity amputations Skin: No rashes, lesions or ulcers Psychiatry: Judgement and insight appear normal. Mood & affect appropriate.     Data Reviewed: I have personally reviewed following labs and imaging studies  CBC: Recent Labs  Lab 04/23/23 0452 04/24/23 0616 04/25/23 0503 04/26/23 0602 04/27/23 1004  WBC 10.0 13.9* 10.1 11.1* 11.8*  NEUTROABS 7.9* 11.5* 7.6 8.9* 9.1*  HGB 10.9* 11.4* 11.1* 11.3* 11.4*  HCT 33.3* 35.0* 35.0* 34.2* 35.0*  MCV 88.6 90.0 92.6 89.8 91.9  PLT 264 294 254 248 280   Basic Metabolic Panel: Recent Labs  Lab 04/24/23 0616 04/25/23 0503 04/26/23 0602 04/27/23 1004 04/28/23 0446  NA 132* 131* 134* 134* 134*  K 5.3* 4.7 5.1 4.9 4.9  CL 96* 93* 99 99 98  CO2 26 29 25 26 26   GLUCOSE 244* 180* 211* 160* 165*  BUN 29* 26* 26* 32* 33*  CREATININE 0.95 1.01 1.01 1.05 1.16  CALCIUM 9.3 9.2 9.3 9.1 9.2  MG 2.4  --   --   --   --   PHOS 4.2  --   --   --   --    GFR: Estimated Creatinine Clearance: 71.7 mL/min (by C-G formula based  on SCr of 1.16 mg/dL). Liver Function Tests: No results for input(s): "AST", "ALT", "ALKPHOS", "BILITOT", "PROT", "ALBUMIN" in the last 168 hours. No results for input(s): "LIPASE", "AMYLASE" in the last 168 hours. No results for input(s): "AMMONIA" in the last 168 hours. Coagulation Profile: No results for input(s): "INR", "PROTIME" in the last 168 hours. Cardiac Enzymes: No results for input(s): "CKTOTAL", "CKMB", "CKMBINDEX", "TROPONINI" in the last 168 hours. BNP (last 3 results) No results for input(s): "PROBNP" in the last 8760  hours. HbA1C: No results for input(s): "HGBA1C" in the last 72 hours. CBG: Recent Labs  Lab 04/27/23 1146 04/27/23 1611 04/27/23 2052 04/27/23 2129 04/28/23 0825  GLUCAP 202* 208* 177* 150* 222*   Lipid Profile: No results for input(s): "CHOL", "HDL", "LDLCALC", "TRIG", "CHOLHDL", "LDLDIRECT" in the last 72 hours. Thyroid Function Tests: No results for input(s): "TSH", "T4TOTAL", "FREET4", "T3FREE", "THYROIDAB" in the last 72 hours. Anemia Panel: No results for input(s): "VITAMINB12", "FOLATE", "FERRITIN", "TIBC", "IRON", "RETICCTPCT" in the last 72 hours. Sepsis Labs: Recent Labs  Lab 04/27/23 1004  PROCALCITON <0.10    No results found for this or any previous visit (from the past 240 hours).       Radiology Studies: No results found.      Scheduled Meds:  [START ON 04/30/2023] amiodarone  200 mg Oral BID   Followed by   Melene Muller ON 05/07/2023] amiodarone  200 mg Oral Daily   amiodarone  400 mg Per Tube BID   apixaban  5 mg Per Tube BID   ascorbic acid  500 mg Per Tube Daily   Chlorhexidine Gluconate Cloth  6 each Topical Daily   cholecalciferol  2,000 Units Per Tube Daily   clonazepam  0.25 mg Per Tube BID   cyanocobalamin  10,000 mcg Per Tube Daily   diphenoxylate-atropine  1 tablet Per Tube QID   ezetimibe  10 mg Per Tube Daily   feeding supplement (GLUCERNA 1.5 CAL)  237 mL Per Tube 6 X Daily   ferrous sulfate  300 mg Per Tube Daily   fiber supplement (BANATROL TF)  60 mL Per Tube TID   free water  140 mL Per Tube 6 X Daily   furosemide  20 mg Per Tube Daily   insulin aspart  0-5 Units Subcutaneous QHS   insulin aspart  0-9 Units Subcutaneous TID WC   insulin glargine-yfgn  40 Units Subcutaneous QHS   levothyroxine  25 mcg Per Tube Q0600   metoprolol tartrate  12.5 mg Oral BID   multivitamin with minerals  1 tablet Per Tube Daily   pramipexole  2 mg Per Tube QHS   primidone  250 mg Per Tube BID   QUEtiapine  25 mg Per Tube QHS   ziprasidone  10 mg  Intramuscular Once   Continuous Infusions:   LOS: 31 days      Tresa Moore, MD [facility]   If 7PM-7AM, please contact night-coverage  04/28/2023, 11:55 AM

## 2023-04-28 NOTE — TOC Progression Note (Signed)
Transition of Care (TOC) - Progression Note    Patient Details  Name: [name]. MRN: [id number] Date of Birth: 1955-07-03  Transition of Care [facility]) CM/SW Contact  Bing Quarry, RN Phone Number: 04/28/2023, 6:34 PM  Clinical Narrative:  12/29: Arneta Cliche clinicals to Fransico Him for [facility] for insurance authorization for [employer]. 636 pm.   Gabriel Cirri MSN RN CM  Care Management Department.  [place]  [facility] Direct Dial: [phone] (Weekends Only) [facility] Main Office Phone: [phone] [facility] Fax: [phone]          Expected Discharge Plan and Services                                               Social Determinants of Health (SDOH) Interventions SDOH Screenings   Food Insecurity: No Food Insecurity (03/29/2023)  Housing: Low Risk  (03/29/2023)  Transportation Needs: No Transportation Needs (03/29/2023)  Utilities: Not At Risk (03/29/2023)  Depression (PHQ2-9): Low Risk  (11/21/2021)  Financial Resource Strain: Low Risk  (09/28/2022)   Received from [facility], [facility]  Tobacco Use: Medium Risk (04/10/2023)    Readmission Risk Interventions    02/13/2022    4:57 PM 09/29/2021   11:12 AM  Readmission Risk Prevention Plan  Transportation Screening Complete Complete  PCP or Specialist Appt within 3-5 Days  Complete  HRI or Home Care Consult  Complete  Social Work Consult for Recovery Care Planning/Counseling  Complete  Palliative Care Screening  Not Applicable  Medication Review Oceanographer) Complete Complete  PCP or Specialist appointment within 3-5 days of discharge Complete   HRI or Home Care Consult Complete   SW Recovery Care/Counseling Consult Complete   Palliative Care Screening Not Applicable   Skilled Nursing Facility Not Applicable
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
## 2023-04-28 NOTE — Consult Note (Signed)
PHARMACY CONSULT NOTE - ELECTROLYTES  Pharmacy Consult for Electrolyte Monitoring and Replacement   Recent Labs: Height: 5\' 5"  (165.1 cm) (Bilateral BKAs) Weight: 115.8 kg (255 lb 4.7 oz) IBW/kg (Calculated) : 61.5 Estimated Creatinine Clearance: 71.7 mL/min (by C-G formula based on SCr of 1.16 mg/dL).  Potassium (mmol/L)  Date Value  04/28/2023 4.9  07/12/2011 4.4   Magnesium (mg/dL)  Date Value  04/54/0981 2.4   Calcium (mg/dL)  Date Value  19/14/7829 9.2   Calcium, Total (mg/dL)  Date Value  56/21/3086 8.8   Albumin (g/dL)  Date Value  57/84/6962 2.4 (L)   Phosphorus (mg/dL)  Date Value  95/28/4132 4.2   Sodium (mmol/L)  Date Value  04/28/2023 134 (L)  07/12/2011 141   Assessment  Alec A Malinoski Montez Hageman. is a 67 y.o. male presenting with hyperkalemia, severe dehydration and AKI. PMH significant for atrial fibrillation, asthma, congestive heart failure, COPD, coronary artery disease, depression, diabetes mellitus, gout, hyperlipidemia, hypertension, hypogonadism in male, morbid obesity, peripheral vascular disease, OSA on CPAP at night, esophageal cancer status post chemo and G-tube placement. Pharmacy has been consulted to monitor and replace electrolytes.  Diet: Tube feeds MIVF: Free water 140 mL per tube 6x/day  Goal of Therapy:  Potassium 4.0 - 5.1 mmol/L Magnesium 2.0 - 2.4 mg/dL All Other Electrolytes WNL  diuretics:  furosemide  20 mg per tube daily  Plan:  ---No electrolyte replacement needed. ---change free water flushes to 30 mL every 4 hours ---F/u with AM labs   Thank you for allowing pharmacy to be a part of this patient's care.  [name], PharmD 04/28/2023 2:23 PM

## 2023-04-28 NOTE — Progress Notes (Signed)
Pt wife called to get update on patient. Wife requests that MD add order that pt is allowed to have ice chips. Upset that she was asked to leave earlier by security.

## 2023-04-29 ENCOUNTER — Ambulatory Visit: Payer: Medicare Other

## 2023-04-29 DIAGNOSIS — E875 Hyperkalemia: Secondary | ICD-10-CM | POA: Diagnosis not present

## 2023-04-29 LAB — MAGNESIUM: Magnesium: 2.7 mg/dL — ABNORMAL HIGH (ref 1.7–2.4)

## 2023-04-29 LAB — URINALYSIS, COMPLETE (UACMP) WITH MICROSCOPIC
Bilirubin Urine: NEGATIVE
Glucose, UA: NEGATIVE mg/dL
Hgb urine dipstick: NEGATIVE
Ketones, ur: NEGATIVE mg/dL
Nitrite: POSITIVE — AB
Protein, ur: 100 mg/dL — AB
Specific Gravity, Urine: 1.02 (ref 1.005–1.030)
Squamous Epithelial / HPF: 0 /[HPF] (ref 0–5)
WBC, UA: >50 WBC/hpf (ref 0–5)
pH: 6 (ref 5.0–8.0)

## 2023-04-29 LAB — BASIC METABOLIC PANEL
Anion gap: 10 (ref 5–15)
BUN: 38 mg/dL — ABNORMAL HIGH (ref 8–23)
CO2: 28 mmol/L (ref 22–32)
Calcium: 8.9 mg/dL (ref 8.9–10.3)
Chloride: 94 mmol/L — ABNORMAL LOW (ref 98–111)
Creatinine, Ser: 1.21 mg/dL (ref 0.61–1.24)
GFR, Estimated: >60 mL/min (ref >60)
Glucose, Bld: 170 mg/dL — ABNORMAL HIGH (ref 70–99)
Potassium: 5.6 mmol/L — ABNORMAL HIGH (ref 3.5–5.1)
Sodium: 132 mmol/L — ABNORMAL LOW (ref 135–145)

## 2023-04-29 LAB — GLUCOSE, CAPILLARY
Glucose-Capillary: 217 mg/dL — ABNORMAL HIGH (ref 70–99)
Glucose-Capillary: 227 mg/dL — ABNORMAL HIGH (ref 70–99)
Glucose-Capillary: 157 mg/dL — ABNORMAL HIGH (ref 70–99)
Glucose-Capillary: 213 mg/dL — ABNORMAL HIGH (ref 70–99)

## 2023-04-29 MED ORDER — SODIUM ZIRCONIUM CYCLOSILICATE 5 G PO PACK
5.0000 g | PACK | Freq: Once | ORAL | Status: AC
  Administered 2023-04-29: 5 g
  Filled 2023-04-29: qty 1

## 2023-04-29 MED ORDER — BANATROL TF EN LIQD
60.0000 mL | Freq: Two times a day (BID) | ENTERAL | Status: DC
  Administered 2023-04-29 – 2023-05-03 (×7): 60 mL

## 2023-04-29 MED ORDER — SODIUM CHLORIDE 0.9 % IV SOLN
1.0000 g | INTRAVENOUS | Status: DC
  Administered 2023-04-29 – 2023-04-30 (×2): 1 g via INTRAVENOUS
  Filled 2023-04-29 (×3): qty 10

## 2023-04-29 NOTE — Progress Notes (Signed)
Nutrition Follow-up  DOCUMENTATION CODES:   Obesity unspecified, Non-severe (moderate) malnutrition in context of chronic illness  INTERVENTION:   -TF via g-tube:    237 ml Glucerna 1.5 6 times daily  70 ml free water flush before each feeding and 70 ml free water flush after each after each feeding administration   Tube feeding regimen provides 2136 kcal (100% of needs), 118 grams of protein, and 1080 ml of H2O.  Total free water: 1920 ml daily   -Allow ice chips for dry mouth per discussion with MD -Continue 1 packet Banatrol BID  NUTRITION DIAGNOSIS:   Moderate Malnutrition related to chronic illness (esopahgeal mass, likely malginant) as evidenced by mild fat depletion, mild muscle depletion.  Ongoing  GOAL:   Patient will meet greater than or equal to 90% of their needs  Met with TF  MONITOR:   TF tolerance  REASON FOR ASSESSMENT:   Consult Assessment of nutrition requirement/status  ASSESSMENT:   Pt with medical history significant of atrial fibrillation, asthma, congestive heart failure, COPD, coronary artery disease, depression, diabetes mellitus, gout, hyperlipidemia, hypertension, hypogonadism in male, morbid obesity, peripheral vascular disease, OSA on CPAP at night, esophageal cancer status post chemo and G-tube placement.  Reviewed I/O's: -1.2 L x 24 hours and -9.7 L since 04/15/23  UOP: 1.7 L x 24 hours   Case discussed with MD; noted consult regarding banatrol. Per MD, consult was requested by RN. RD discussed that pt wife requested banatrol be re-started, as pt was trailed off of this, but diarrhea worsened and banatrol observed to help. NOted elevated K levels; RD to decreased banatrol to BID. Per MD, schedule lokelma has also been added.   Per TOC notes, plan for SNF placement ([facility]) at discharge.   Medications reviewed and include vitamin C, klonopin, vitamin B-12, lomotil, zetia, ferrous sulfate, lasix, and lokelma.   Labs reviewed:  Na: 132, K: 5.6, Mg: 2.7, CBGS: 150-228 (inpatient orders for glycemic control are 0-5 units insulin aspart TID with meals, and 40 units insulin glargine-yfgn daily).    Diet Order:   Diet Order             Diet NPO time specified  Diet effective now                   EDUCATION NEEDS:   Education needs have been addressed  Skin:  Skin Assessment: Reviewed RN Assessment  Last BM:  06/24/22 (type 6)  Height:   Ht Readings from Last 1 Encounters:  04/02/23 5\' 5"  (1.651 m)    Weight:   Wt Readings from Last 1 Encounters:  04/29/23 115.2 kg    Ideal Body Weight:  75.2 kg (adjusted for bilateral BKAs)  BMI:  Body mass index is 42.26 kg/m.  Estimated Nutritional Needs:   Kcal:  4696-2952  Protein:  115-130 grams  Fluid:  > 2 L    Levada Schilling, RD, LDN, CDCES Registered Dietitian III Certified Diabetes Care and Education Specialist If unable to reach this RD, please use "RD Inpatient" group chat on secure chat between hours of 8am-4 pm daily

## 2023-04-29 NOTE — Plan of Care (Signed)
  Problem: Education: Goal: Ability to describe self-care measures that may prevent or decrease complications (Diabetes Survival Skills Education) will improve Outcome: Progressing   Problem: Metabolic: Goal: Ability to maintain appropriate glucose levels will improve Outcome: Progressing   Problem: Skin Integrity: Goal: Risk for impaired skin integrity will decrease Outcome: Progressing   Problem: Clinical Measurements: Goal: Diagnostic test results will improve Outcome: Progressing Goal: Respiratory complications will improve Outcome: Progressing Goal: Cardiovascular complication will be avoided Outcome: Progressing

## 2023-04-29 NOTE — Consult Note (Signed)
PHARMACY CONSULT NOTE - ELECTROLYTES  Pharmacy Consult for Electrolyte Monitoring and Replacement   Recent Labs: Height: 5\' 5"  (165.1 cm) (Bilateral BKAs) Weight: 115.2 kg (253 lb 15.5 oz) IBW/kg (Calculated) : 61.5 Estimated Creatinine Clearance: 68.6 mL/min (by C-G formula based on SCr of 1.21 mg/dL).  Potassium (mmol/L)  Date Value  04/29/2023 5.6 (H)  07/12/2011 4.4   Magnesium (mg/dL)  Date Value  16/01/9603 2.7 (H)   Calcium (mg/dL)  Date Value  54/12/8117 8.9   Calcium, Total (mg/dL)  Date Value  14/78/2956 8.8   Albumin (g/dL)  Date Value  21/30/8657 2.4 (L)   Phosphorus (mg/dL)  Date Value  84/69/6295 4.2   Sodium (mmol/L)  Date Value  04/29/2023 132 (L)  07/12/2011 141   Corrected calcium: 10.18 mg/dL  Assessment: [name]. is a 67 y.o. male presenting with hyperkalemia, severe dehydration and AKI. PMH significant for atrial fibrillation, asthma, congestive heart failure, COPD, coronary artery disease, depression, diabetes mellitus, gout, hyperlipidemia, hypertension, hypogonadism in male, morbid obesity, peripheral vascular disease, OSA on CPAP at night, esophageal cancer status post chemo and G-tube placement. Pharmacy has been consulted to monitor and replace electrolytes.  Diet: NPO MIVF: Free water 30 mL per tube 6x/day  Goal of Therapy:  Potassium 4.0 - 5.1 mmol/L Magnesium 2.0 - 2.4 mg/dL All Other Electrolytes WNL  Diuretics: furosemide 20 mg per tube daily  Plan:  ---No electrolyte replacement indicated at this time  -- K+ 5.6; Lokelma 5 gm per tube x 1 ordered by provider ---F/u with AM labs   Thank you for allowing pharmacy to be a part of this patient's care.  Littie Deeds, PharmD Pharmacy Resident  04/29/2023 7:29 AM

## 2023-04-29 NOTE — Progress Notes (Signed)
Chaplain meet with [name], wife, in cafeteria for over 2 hours.   Grief: [name] shared she is angry at the health care system for care that had poor results and feeling she has had to be caregiver for their entire marriage; bargaining for more time with [name] (they have only been married 3 years); denial as she faces yet another spouse leaving her; feelings of loneliness as she feels alone in this process; and acceptance that she knows some of [name]'s wishes and named "funeral" for first time.  What matters most: Chaplain explored with [name] what she thought was important to [name]. She named that walking was something [name] really wanted to do which he was able to stand at a recent PT appointment and was feeling accomplished. He also has enjoyed working on making small planes. This is still set up at home.   End of life: Today he asked about going home, he shared with her some time back that he does want CPR done once (he may not be aware of the impact this would have on her body due to previous heart surgeries). Her sister (a former Engineer, civil (consulting)) explained this to [name] so she knows that this would be complicated by [name]'s previous heart surgery.   Final Affairs: [name] does not have a will in place and today she spoke to an attorney.   Family Dynamics: Complicated relationships between [name] and his brother and two grandchildren (whom [name] has informed family he is not longer communicating with). [name] also shared [name] struggled with how his parents treated him as a result he has negative self-talk and copes with humor.   Chaplain offered listening presences, validated feelings and experiences, offered reflection of the journey she and [name] have been experiences, and EOL education. Chaplain assessed grief as high, stress and slower processing that impacts decision making. [name] has her own health concerns that are increasing as she takes care of [name] (ie. Diabetes, blood pressure). Chaplain encouraged  self-care. [name] has taken care of [name] at home with a h  Chaplain informed [name] that Spiritual Care services are available 24/7 upon request.     04/29/23 2100  Spiritual Encounters  Type of Visit Initial  Care provided to: [name] partners present during encounter Physician;Nurse  Referral source Nurse (RN/NT/LPN)  Reason for visit  (Complex Care)  OnCall Visit Yes

## 2023-04-29 NOTE — Progress Notes (Signed)
PROGRESS NOTE    [name].  KGM:[id number] DOB: August 23, 1955 DOA: 03/28/2023 PCP: [name], MD    Brief Narrative:   [name]. is a 67 y.o. male with medical history significant of atrial fibrillation, asthma, congestive heart failure, COPD, coronary artery disease, depression, diabetes mellitus, gout, hyperlipidemia, hypertension, hypogonadism in male, morbid obesity, peripheral vascular disease, OSA on CPAP at night, esophageal cancer status post chemo and G-tube placement.  Patient currently being managed for hyperkalemia, severe dehydration and AKI and acute metabolic encephalopathy. MRI of the brain was negative, ammonia level was also negative. Patient was treated with [facility] for hypercalcemia.  Patient was also started on oral amiodarone for atrial flutter with RVR.  Followed by cardiology. 12/21.  Patient developed urinary retention again, Foley catheter was anchored after repeat I&O cath. 12/22.  Potassium still high, continued on Lokelma, also added a dose of Lasix, give a dose of albumin follow-up blood pressure. 12/30: Potassium up to 5.6  Assessment & Plan:   Principal Problem:   Hyperkalemia Active Problems:   Delirium   Acute kidney injury (HCC)   Malnutrition of moderate degree   Acute hyperkalemia   Diarrhea   Perinephric fluid collection   Persistent atrial fibrillation (HCC)   Typical atrial flutter (HCC)   Diarrhea of infectious origin   Atrial flutter with rapid ventricular response (HCC)   Malignant neoplasm of lower third of esophagus (HCC)   Palliative care encounter   SOB (shortness of breath)  Acute on chronic persistent atrial flutter with rvr Intermittent hypotension. Patient underwent successful cardioversion 04/23/2023 Continue metoprolol Diltiazem was discontinued on 12/22 due to hypotension Remains in NSR Can likely discontinue telemetry and moved to MedSurg   Acute kidney injury secondary to  dehydration Hyperkalemia. Hyponatremia. This is secondary to dehydration from diarrhea.  Renal function had improved after fluids.  Hyperkalemia recurred on 12/30.  1 dose of Lokelma.  Recheck BMP in a.m.   Delirium secondary to acute metabolic encephalopathy.-Improving Klebsiella UTI Patient urine culture was positive for Klebsiella at time of admission, completed antibiotics.  At this time I do not suspect patient's delirium to be infectious in nature.  Suspect hospital-acquired delirium in the setting of recent hospitalization.  This is unlikely to improve with continued hospitalization.  Patient will greatly benefit from hospital discharge and skilled nursing facility placement.  Continue low-dose Seroquel nightly.  IM Geodon as needed agitation.  Avoid Haldol.  Avoid benzodiazepines. -Would greatly benefit patient to be able to exit his room in a wheelchair and care of nursing staff or the patient's wife.    Acute diarrhea secondary to C. difficile colitis. Patient is status post 10 days of Dificid followed with 10-day course of vancomycin.   Status post flexible sigmoidoscopy on 12/11.  Normal-appearing mucosa.  Biopsies taken.  Diarrhea has been improving as of 12/12 Diarrhea improving, but the patient still has some loose stools from tube feeding.   Continue as needed Imodium.  Start scheduled Lomotil 4 times daily for now   Possible right perinephric hematoma --Described as possible abscess by radiology but per secure chat communication w/ Dr. Apolinar Junes on 12/9, this is a chornic complex hematoma dating back over a year, nothing that has been drainable thus far, possible underlying renal mass, and in light of significant comorbidities and as he is asymptomatic inpatient evaluation has been deferred Continue to follow-up with urologist as an outpatient     Urinary retention secondary to benign prostate hypertrophy. Patient developed  significant urinary retention since last night with  residual over 500 mL, patient had In-N-Out cath multiple times, Foley catheter was anchored again.  Monitor UOP   Chronic diastolic congestive heart failure --Recent EF of 55% on echo obtained on November 2024 No exacerbation.   COPD Continue as needed nebulization    Coronary artery disease,  Continue aspirin therapy   Diabetes mellitus type 2 with hyperglycemia Continue Semglee 30 units nightly (previous dose 35 units)   Hypothyroidism Continue levothyroxine   Moderate protein calorie malnutrition. Nutrition Problem: Moderate Malnutrition Etiology: chronic illness (esopahgeal mass, likely malginant) Signs/Symptoms: mild fat depletion, mild muscle depletion Interventions: Tube feeding   Peripheral vascular disease S/p bilateral BKAs --Continue aspirin  --Patient is allergic to statin therapy   OSA on CPAP at night,  Continue CPAP   Esophageal cancer status post chemo and G-tube placement. Continue bolus tube feeding.  DVT prophylaxis: Apixaban Code Status: Full Family Communication: Wife [name] via phone [phone] on 12/28 Disposition Plan: Status is: Inpatient Remains inpatient appropriate because: Unsafe discharge plan.  Pending insurance authorization for skilled nursing facility.  Medically appropriate for discharge to SNF   Level of care: Telemetry Medical  Consultants:  None  Procedures:  Cardioversion  Antimicrobials: None   Subjective: Seen and examined.  More lucid this morning.  Pleasant communicative.  Objective: Vitals:   04/28/23 1700 04/28/23 1925 04/29/23 0500 04/29/23 0533  BP:  111/70  128/74  Pulse:  90  70  Resp:  (!) 21  17  Temp: 98 F (36.7 C) 98.2 F (36.8 C)  97.8 F (36.6 C)  TempSrc: Oral Oral  Oral  SpO2:  93%  95%  Weight:   115.2 kg   Height:        Intake/Output Summary (Last 24 hours) at 04/29/2023 1027 Last data filed at 04/29/2023 0536 Gross per 24 hour  Intake --  Output 1100 ml  Net -1100 ml   Filed  Weights   04/26/23 0500 04/27/23 0445 04/29/23 0500  Weight: 118 kg 115.8 kg 115.2 kg    Examination:  General exam: NAD.  Lucid this morning Respiratory system: Bibasilar crackles.  Normal work of breathing.  2 L Cardiovascular system: S1-S2, RRR, no murmurs, no pedal edema Gastrointestinal system: Soft, NT/ND, + PEG tube Central nervous system: Alert.  Oriented x 2.  No focal deficits Extremities: Bilateral lower extremity amputations Skin: No rashes, lesions or ulcers Psychiatry: Judgement and insight appear normal. Mood & affect appropriate.     Data Reviewed: I have personally reviewed following labs and imaging studies  CBC: Recent Labs  Lab 04/23/23 0452 04/24/23 0616 04/25/23 0503 04/26/23 0602 04/27/23 1004  WBC 10.0 13.9* 10.1 11.1* 11.8*  NEUTROABS 7.9* 11.5* 7.6 8.9* 9.1*  HGB 10.9* 11.4* 11.1* 11.3* 11.4*  HCT 33.3* 35.0* 35.0* 34.2* 35.0*  MCV 88.6 90.0 92.6 89.8 91.9  PLT 264 294 254 248 280   Basic Metabolic Panel: Recent Labs  Lab 04/24/23 0616 04/25/23 0503 04/26/23 0602 04/27/23 1004 04/28/23 0446 04/29/23 0322  NA 132* 131* 134* 134* 134* 132*  K 5.3* 4.7 5.1 4.9 4.9 5.6*  CL 96* 93* 99 99 98 94*  CO2 26 29 25 26 26 28   GLUCOSE 244* 180* 211* 160* 165* 170*  BUN 29* 26* 26* 32* 33* 38*  CREATININE 0.95 1.01 1.01 1.05 1.16 1.21  CALCIUM 9.3 9.2 9.3 9.1 9.2 8.9  MG 2.4  --   --   --   --  2.7*  PHOS 4.2  --   --   --   --   --    GFR: Estimated Creatinine Clearance: 68.6 mL/min (by C-G formula based on SCr of 1.21 mg/dL). Liver Function Tests: No results for input(s): "AST", "ALT", "ALKPHOS", "BILITOT", "PROT", "ALBUMIN" in the last 168 hours. No results for input(s): "LIPASE", "AMYLASE" in the last 168 hours. No results for input(s): "AMMONIA" in the last 168 hours. Coagulation Profile: No results for input(s): "INR", "PROTIME" in the last 168 hours. Cardiac Enzymes: No results for input(s): "CKTOTAL", "CKMB", "CKMBINDEX", "TROPONINI"  in the last 168 hours. BNP (last 3 results) No results for input(s): "PROBNP" in the last 8760 hours. HbA1C: No results for input(s): "HGBA1C" in the last 72 hours. CBG: Recent Labs  Lab 04/28/23 0825 04/28/23 1218 04/28/23 1639 04/28/23 2105 04/29/23 0756  GLUCAP 222* 216* 228* 211* 217*   Lipid Profile: No results for input(s): "CHOL", "HDL", "LDLCALC", "TRIG", "CHOLHDL", "LDLDIRECT" in the last 72 hours. Thyroid Function Tests: No results for input(s): "TSH", "T4TOTAL", "FREET4", "T3FREE", "THYROIDAB" in the last 72 hours. Anemia Panel: No results for input(s): "VITAMINB12", "FOLATE", "FERRITIN", "TIBC", "IRON", "RETICCTPCT" in the last 72 hours. Sepsis Labs: Recent Labs  Lab 04/27/23 1004  PROCALCITON <0.10    No results found for this or any previous visit (from the past 240 hours).       Radiology Studies: No results found.      Scheduled Meds:  [START ON 04/30/2023] amiodarone  200 mg Oral BID   Followed by   Melene Muller ON 05/07/2023] amiodarone  200 mg Oral Daily   amiodarone  400 mg Per Tube BID   apixaban  5 mg Per Tube BID   ascorbic acid  500 mg Per Tube Daily   Chlorhexidine Gluconate Cloth  6 each Topical Daily   cholecalciferol  2,000 Units Per Tube Daily   clonazepam  0.25 mg Per Tube BID   cyanocobalamin  10,000 mcg Per Tube Daily   diphenoxylate-atropine  1 tablet Per Tube QID   ezetimibe  10 mg Per Tube Daily   feeding supplement (GLUCERNA 1.5 CAL)  237 mL Per Tube 6 X Daily   ferrous sulfate  300 mg Per Tube Daily   fiber supplement (BANATROL TF)  60 mL Per Tube BID   free water  30 mL Per Tube 6 X Daily   furosemide  20 mg Per Tube Daily   insulin aspart  0-5 Units Subcutaneous QHS   insulin aspart  0-9 Units Subcutaneous TID WC   insulin glargine-yfgn  40 Units Subcutaneous QHS   levothyroxine  25 mcg Per Tube Q0600   metoprolol tartrate  12.5 mg Oral BID   multivitamin with minerals  1 tablet Per Tube Daily   pramipexole  2 mg Per Tube  QHS   primidone  250 mg Per Tube BID   QUEtiapine  25 mg Per Tube QHS   Continuous Infusions:   LOS: 32 days      Tresa Moore, MD [facility]   If 7PM-7AM, please contact night-coverage  04/29/2023, 10:27 AM

## 2023-04-29 NOTE — TOC Progression Note (Addendum)
Transition of Care (TOC) - Progression Note    Patient Details  Name: [name]. MRN: [id number] Date of Birth: 1955-07-22  Transition of Care [facility]) CM/SW Contact  Truddie Hidden, RN Phone Number: 04/29/2023, 10:36 AM  Clinical Narrative:    Sherron Monday with Dara Hoyer representative for [facility]. Auth confirmed as received and is pending. MD notified.    4:27pm Retrieved message from [facility] assistant. Patient has been denied by [facility] for exhausted SNF days. Patient wife was provided phone number to call for the option to appeal Appeals Number for [employer] [phone] Option #1 then Option#2 fax #[phone]. [name] was given ref #[id number].            Expected Discharge Plan and Services                                               Social Determinants of Health (SDOH) Interventions SDOH Screenings   Food Insecurity: No Food Insecurity (03/29/2023)  Housing: Low Risk  (03/29/2023)  Transportation Needs: No Transportation Needs (03/29/2023)  Utilities: Not At Risk (03/29/2023)  Depression (PHQ2-9): Low Risk  (11/21/2021)  Financial Resource Strain: Low Risk  (09/28/2022)   Received from [facility]  Tobacco Use: Medium Risk (04/10/2023)    Readmission Risk Interventions    02/13/2022    4:57 PM 09/29/2021   11:12 AM  Readmission Risk Prevention Plan  Transportation Screening Complete Complete  PCP or Specialist Appt within 3-5 Days  Complete  HRI or Home Care Consult  Complete  Social Work Consult for Recovery Care Planning/Counseling  Complete  Palliative Care Screening  Not Applicable  Medication Review Oceanographer) Complete Complete  PCP or Specialist appointment within 3-5 days of discharge Complete   HRI or Home Care Consult Complete   SW Recovery Care/Counseling Consult Complete   Palliative Care Screening Not Applicable   Skilled Nursing Facility Not Applicable

## 2023-04-30 DIAGNOSIS — N2889 Other specified disorders of kidney and ureter: Secondary | ICD-10-CM | POA: Diagnosis not present

## 2023-04-30 DIAGNOSIS — E875 Hyperkalemia: Secondary | ICD-10-CM | POA: Diagnosis not present

## 2023-04-30 DIAGNOSIS — Z515 Encounter for palliative care: Secondary | ICD-10-CM | POA: Diagnosis not present

## 2023-04-30 LAB — BASIC METABOLIC PANEL
Anion gap: 11 (ref 5–15)
BUN: 44 mg/dL — ABNORMAL HIGH (ref 8–23)
CO2: 26 mmol/L (ref 22–32)
Calcium: 8.9 mg/dL (ref 8.9–10.3)
Chloride: 95 mmol/L — ABNORMAL LOW (ref 98–111)
Creatinine, Ser: 1.24 mg/dL (ref 0.61–1.24)
GFR, Estimated: >60 mL/min (ref >60)
Glucose, Bld: 195 mg/dL — ABNORMAL HIGH (ref 70–99)
Potassium: 4.9 mmol/L (ref 3.5–5.1)
Sodium: 132 mmol/L — ABNORMAL LOW (ref 135–145)

## 2023-04-30 LAB — GLUCOSE, CAPILLARY
Glucose-Capillary: 165 mg/dL — ABNORMAL HIGH (ref 70–99)
Glucose-Capillary: 197 mg/dL — ABNORMAL HIGH (ref 70–99)
Glucose-Capillary: 177 mg/dL — ABNORMAL HIGH (ref 70–99)
Glucose-Capillary: 182 mg/dL — ABNORMAL HIGH (ref 70–99)

## 2023-04-30 LAB — PHOSPHORUS: Phosphorus: 4.7 mg/dL — ABNORMAL HIGH (ref 2.5–4.6)

## 2023-04-30 LAB — MAGNESIUM: Magnesium: 2.4 mg/dL (ref 1.7–2.4)

## 2023-04-30 MED ORDER — METHOCARBAMOL 500 MG PO TABS
500.0000 mg | ORAL_TABLET | Freq: Four times a day (QID) | ORAL | Status: DC | PRN
  Administered 2023-04-30: 500 mg via ORAL
  Filled 2023-04-30: qty 1

## 2023-04-30 MED ORDER — LIDOCAINE 5 % EX PTCH
1.0000 | MEDICATED_PATCH | CUTANEOUS | Status: DC
  Administered 2023-04-30 – 2023-05-01 (×2): 1 via TRANSDERMAL
  Filled 2023-04-30 (×4): qty 1

## 2023-04-30 NOTE — Progress Notes (Signed)
 Mobility Specialist - Progress Note   04/30/23 1300  Mobility  Activity Transferred from chair to bed;Turned to right side;Turned to back - supine  Level of Assistance +2 (takes two people) (hoyer\)  Assistive Device Sling  Activity Response Tolerated well  Mobility visit 1 Mobility     Pt transferred chair-bed via hoyer lift. RN at bedside to assist. Pt left in bed with alarm set, needs in reach.    Lennette Seip Mobility Specialist 04/30/23, 1:59 PM

## 2023-04-30 NOTE — Progress Notes (Signed)
 Palliative Medicine [facility] at [facility] [phone] [phone]   Name: [name]. Date: 04/30/2023 MRN: [id number]  DOB: Apr 08, 1956  Patient Care Team: Auston Reyes BIRCH, MD as PCP - General (Internal Medicine) Darron Deatrice DELENA, MD as PCP - Cardiology (Cardiology) Rennie Cindy SAUNDERS, MD as Consulting Physician (Oncology)    REASON FOR CONSULTATION: [name]. is a 67 y.o. male with multiple medical problems including COPD, A-fib, history of CHF, PVD status post bilateral BKAs, and esophageal cancer status post PEG.  Patient admitted the hospital with multiple metabolic derangements and metabolic encephalopathy.  Found to have UTI and C. difficile colitis.  Palliative care consulted to address goals.    CODE STATUS: Full code  PAST MEDICAL HISTORY: Past Medical History:  Diagnosis Date   Arrhythmia    atrial fibrillation   Asthma    CHF (congestive heart failure) (HCC)    COPD (chronic obstructive pulmonary disease) (HCC)    Coronary artery disease    Depression    Diabetes mellitus without complication (HCC)    Gout    History anabolic steroid use    Hyperlipidemia    Hypertension    Hypogonadism in male    MI (myocardial infarction) (HCC)    Morbid obesity (HCC)    Myocardial infarction (HCC)    Peripheral vascular disease (HCC)    Perirectal abscess    Pleurisy    Sleep apnea    CPAP at night, no oxygen    Varicella     PAST SURGICAL HISTORY:  Past Surgical History:  Procedure Laterality Date   ABDOMINAL AORTIC ANEURYSM REPAIR     ACHILLES TENDON SURGERY Left 01/10/2021   Procedure: ACHILLES LENGTHENING/KIDNER;  Surgeon: Lennie Barter, DPM;  Location: [facility] OR[facility];  Service: Podiatry;  Laterality: Left;   AMPUTATION Left 10/03/2021   Procedure: AMPUTATION BELOW KNEE;  Surgeon: Jama Cordella MATSU, MD;  Location: [facility];  Service: Vascular;  Laterality: Left;   AMPUTATION Right  05/24/2022   Procedure: AMPUTATION BELOW KNEE;  Surgeon: Marea Selinda RAMAN, MD;  Location: [facility];  Service: General;  Laterality: Right;   AMPUTATION TOE Right 02/10/2016   Procedure: AMPUTATION TOE 3RD TOE;  Surgeon: Eva Gay, DPM;  Location: [facility];  Service: Podiatry;  Laterality: Right;   AMPUTATION TOE Left 02/24/2020   Procedure: AMPUTATION TOE;  Surgeon: Lennie Barter, DPM;  Location: [facility];  Service: Podiatry;  Laterality: Left;   APPLICATION OF WOUND VAC Left 02/29/2020   Procedure: APPLICATION OF WOUND VAC;  Surgeon: Lennie Barter, DPM;  Location: [facility];  Service: Podiatry;  Laterality: Left;   BIOPSY  03/15/2023   Procedure: BIOPSY;  Surgeon: Therisa Bi, MD;  Location: [facility] ENDOSCOPY;  Service: Gastroenterology;;   BIOPSY  04/10/2023   Procedure: BIOPSY;  Surgeon: Aundria, Ladell POUR, MD;  Location: [facility] ENDOSCOPY;  Service: Gastroenterology;;   CARDIAC CATHETERIZATION     CARDIOVERSION [name] 02/13/2022   Procedure: CARDIOVERSION;  Surgeon: Hester Wolm PARAS, MD;  Location: [facility];  Service: Cardiovascular;  Laterality: [name];   CARDIOVERSION [name] 04/23/2023   Procedure: CARDIOVERSION;  Surgeon: Perla Evalene PARAS, MD;  Location: [facility];  Service: Cardiovascular;  Laterality: [name];   COLONOSCOPY WITH PROPOFOL  [name] 11/18/2015   Procedure: COLONOSCOPY WITH PROPOFOL ;  Surgeon: Lamar ONEIDA Holmes, MD;  Location: [facility] ENDOSCOPY;  Service: Endoscopy;  Laterality: [name];   CORONARY ARTERY BYPASS GRAFT     CORONARY STENT INTERVENTION [name] 02/02/2020   Procedure: CORONARY  STENT INTERVENTION;  Surgeon: Ammon Blunt, MD;  Location: [facility] INVASIVE CV LAB;  Service: Cardiovascular;  Laterality: [name];   ESOPHAGOGASTRODUODENOSCOPY (EGD) WITH PROPOFOL  [name] 03/15/2023   Procedure: ESOPHAGOGASTRODUODENOSCOPY (EGD) WITH PROPOFOL ;  Surgeon: Therisa Bi, MD;  Location: [facility] ENDOSCOPY;  Service: Gastroenterology;  Laterality: [name];   FLEXIBLE SIGMOIDOSCOPY [name] 04/10/2023   Procedure: FLEXIBLE  SIGMOIDOSCOPY;  Surgeon: Toledo, Ladell POUR, MD;  Location: [facility] ENDOS[facility];  Service: Gastroenterology;  Laterality: [name];   GASTROSTOMY [name] 03/18/2023   Procedure: INSERTION OF GASTROSTOMY TUBE;  Surgeon: Rodolph Romano, MD;  Location: [facility];  Service: General;  Laterality: [name];   IMPACTION REMOVAL  03/15/2023   Procedure: IMPACTION REMOVAL;  Surgeon: Therisa Bi, MD;  Location: [facility] ENDOSCOPY;  Service: Gastroenterology;;   INCISION AND DRAINAGE Left 08/07/2021   Procedure: INCISION AND DRAINAGE-Partial Calcanectomy;  Surgeon: Lennie Barter, DPM;  Location: [facility];  Service: Podiatry;  Laterality: Left;   IRRIGATION AND DEBRIDEMENT FOOT Left 02/29/2020   Procedure: IRRIGATION AND DEBRIDEMENT FOOT;  Surgeon: Lennie Barter, DPM;  Location: [facility];  Service: Podiatry;  Laterality: Left;   IRRIGATION AND DEBRIDEMENT FOOT Left 02/24/2020   Procedure: IRRIGATION AND DEBRIDEMENT FOOT;  Surgeon: Lennie Barter, DPM;  Location: [facility];  Service: Podiatry;  Laterality: Left;   KNEE ARTHROSCOPY     LEFT HEART CATH AND CORS/GRAFTS ANGIOGRAPHY [name] 02/02/2020   Procedure: LEFT HEART CATH AND CORS/GRAFTS ANGIOGRAPHY;  Surgeon: Bosie Vinie LABOR, MD;  Location: [facility];  Service: Cardiovascular;  Laterality: [name];   LOWER EXTREMITY ANGIOGRAPHY Left 02/25/2020   Procedure: Lower Extremity Angiography;  Surgeon: Marea Selinda RAMAN, MD;  Location: [facility];  Service: Cardiovascular;  Laterality: Left;   LOWER EXTREMITY ANGIOGRAPHY Left 01/04/2021   Procedure: LOWER EXTREMITY ANGIOGRAPHY;  Surgeon: Marea Selinda RAMAN, MD;  Location: [facility];  Service: Cardiovascular;  Laterality: Left;   LOWER EXTREMITY ANGIOGRAPHY Right 05/21/2022   Procedure: Lower Extremity Angiography;  Surgeon: Marea Selinda RAMAN, MD;  Location: [facility];  Service: Cardiovascular;  Laterality: Right;   METATARSAL HEAD EXCISION Left 01/10/2021   Procedure: METATARSAL HEAD EXCISION - LEFT 5th;  Surgeon:  Lennie Barter, DPM;  Location: [facility];  Service: Podiatry;  Laterality: Left;   PERIPHERAL VASCULAR CATHETERIZATION Right 01/24/2016   Procedure: Lower Extremity Angiography;  Surgeon: Cordella KANDICE Shawl, MD;  Location: [facility];  Service: Cardiovascular;  Laterality: Right;   PERIPHERAL VASCULAR CATHETERIZATION Right 01/25/2016   Procedure: Lower Extremity Angiography;  Surgeon: Cordella KANDICE Shawl, MD;  Location: [facility];  Service: Cardiovascular;  Laterality: Right;   PORTACATH PLACEMENT [name] 03/18/2023   Procedure: INSERTION PORT-A-CATH;  Surgeon: Rodolph Romano, MD;  Location: [facility];  Service: General;  Laterality: [name];   TOE AMPUTATION     TONSILLECTOMY      HEMATOLOGY/ONCOLOGY HISTORY:  Oncology History  Esophageal adenocarcinoma (HCC)  03/15/2023 Initial Diagnosis   Esophageal adenocarcinoma (HCC)   04/25/2023 -  Chemotherapy   Patient is on Treatment Plan : ESOPHAGUS Carboplatin + Paclitaxel Weekly X 6 Weeks with XRT       ALLERGIES:  is allergic to penicillins, statins, and metformin  and related.  MEDICATIONS:  Current Facility-Administered Medications  Medication Dose Route Frequency Provider Last Rate Last Admin   acetaminophen  (TYLENOL ) tablet 650 mg  650 mg Per Tube Q6H PRN Chappell, Alex B, RPH   650 mg at 04/29/23 2142   amiodarone  (PACERONE ) tablet 200 mg  200 mg Oral BID Patel, Kishan  S, RPH   200 mg at 04/30/23 9053   Followed by   NOREEN ON 05/07/2023] amiodarone  (PACERONE ) tablet 200 mg  200 mg Oral Daily Patel, Kishan S, RPH       apixaban  (ELIQUIS ) tablet 5 mg  5 mg Per Tube BID Chappell, Alex B, RPH   5 mg at 04/30/23 9052   ascorbic acid  (VITAMIN C ) tablet 500 mg  500 mg Per Tube Daily Clair Marolyn NOVAK, RPH   500 mg at 04/30/23 9052   cefTRIAXone  (ROCEPHIN ) 1 g in sodium chloride  0.9 % 100 mL IVPB  1 g Intravenous Q24H Sreenath, Sudheer B, MD 200 mL/hr at 04/29/23 1529 1 g at 04/29/23 1529   Chlorhexidine  Gluconate Cloth 2 %  PADS 6 each  6 each Topical Daily Laurita Pillion, MD   6 each at 04/30/23 1000   cholecalciferol  (VITAMIN D3) 25 MCG (1000 UNIT) tablet 2,000 Units  2,000 Units Per Tube Daily Clair Marolyn NOVAK, RPH   2,000 Units at 04/30/23 9052   clonazePAM  (KLONOPIN ) disintegrating tablet 0.25 mg  0.25 mg Per Tube BID Djan, Prince T, MD   0.25 mg at 04/30/23 9052   cyanocobalamin  (VITAMIN B12) tablet 10,000 mcg  10,000 mcg Per Tube Daily Clair Marolyn NOVAK, RPH   10,000 mcg at 04/30/23 9051   diphenoxylate -atropine  (LOMOTIL ) 2.5-0.025 MG per tablet 1 tablet  1 tablet Per Tube QID Sreenath, Sudheer B, MD   1 tablet at 04/30/23 9051   ezetimibe  (ZETIA ) tablet 10 mg  10 mg Per Tube Daily Clair Marolyn NOVAK, RPH   10 mg at 04/30/23 9051   feeding supplement (GLUCERNA 1.5 CAL) liquid 237 mL  237 mL Per Tube 6 X Daily Dorinda Homans T, MD   237 mL at 04/30/23 0948   ferrous sulfate  220 (44 Fe) MG/5ML solution 300 mg  300 mg Per Tube Daily Clair Marolyn NOVAK, RPH   300 mg at 04/30/23 9051   fiber supplement (BANATROL TF) liquid 60 mL  60 mL Per Tube BID Jhonny Sahara B, MD   60 mL at 04/30/23 0949   free water  30 mL  30 mL Per Tube 6 X Daily Nada Adriana BIRCH, RPH   30 mL at 04/30/23 9050   furosemide  (LASIX ) tablet 20 mg  20 mg Per Tube Daily Laurita Pillion, MD   20 mg at 04/30/23 9050   insulin  aspart (novoLOG ) injection 0-5 Units  0-5 Units Subcutaneous QHS Dorinda Homans T, MD   2 Units at 04/28/23 2111   insulin  aspart (novoLOG ) injection 0-9 Units  0-9 Units Subcutaneous TID WC Dorinda Homans DASEN, MD   2 Units at 04/30/23 1300   insulin  glargine-yfgn (SEMGLEE ) injection 40 Units  40 Units Subcutaneous QHS Sreenath, Sudheer B, MD   40 Units at 04/29/23 2140   ipratropium-albuterol  (DUONEB) 0.5-2.5 (3) MG/3ML nebulizer solution 3 mL  3 mL Nebulization Q4H PRN Dorinda Homans DASEN, MD       levothyroxine  (SYNTHROID ) tablet 25 mcg  25 mcg Per Tube Q0600 Clair Marolyn NOVAK, RPH   25 mcg at 04/29/23 0543   loperamide  HCl (IMODIUM ) 1 MG/7.5ML  suspension 2 mg  2 mg Per Tube Q6H PRN Chappell, Alex B, RPH   2 mg at 04/28/23 9046   metoprolol  tartrate (LOPRESSOR ) tablet 12.5 mg  12.5 mg Oral BID Sreenath, Sudheer B, MD   12.5 mg at 04/30/23 9050   multivitamin with minerals tablet 1 tablet  1 tablet Per Tube Daily Clair Marolyn NOVAK, [facility]  1 tablet at 04/30/23 9049   nitroGLYCERIN  (NITROSTAT ) SL tablet 0.4 mg  0.4 mg Sublingual Q5 min PRN Maree Hue, MD       ondansetron  (ZOFRAN -ODT) disintegrating tablet 4 mg  4 mg Oral Q6H PRN Kandis Devaughn Sayres, MD   4 mg at 04/04/23 9156   Oral care mouth rinse  15 mL Mouth Rinse PRN Jhonny Sahara B, MD       pramipexole  (MIRAPEX ) tablet 2 mg  2 mg Per Tube QHS Chappell, Alex B, RPH   2 mg at 04/29/23 2138   primidone  (MYSOLINE ) tablet 250 mg  250 mg Per Tube BID Chappell, Alex B, RPH   250 mg at 04/30/23 9049   QUEtiapine  (SEROQUEL ) tablet 25 mg  25 mg Per Tube QHS Clair Marolyn NOVAK, RPH   25 mg at 04/29/23 2137   risperiDONE  (RISPERDAL ) tablet 0.5 mg  0.5 mg Per Tube BID PRN Dorinda Homans T, MD   0.5 mg at 04/27/23 2118   ziprasidone  (GEODON ) injection 10 mg  10 mg Intramuscular Daily PRN Sreenath, Sudheer B, MD   10 mg at 04/30/23 0503    VITAL SIGNS: BP (!) 95/57 (BP Location: Right Arm)   Pulse 77   Temp 97.8 F (36.6 C) (Axillary)   Resp 19   Ht 5' 5 (1.651 m) Comment: Bilateral BKAs  Wt 262 lb 4.8 oz (119 kg)   SpO2 94%   BMI 43.65 kg/m  Filed Weights   04/27/23 0445 04/29/23 0500 04/30/23 0340  Weight: 255 lb 4.7 oz (115.8 kg) 253 lb 15.5 oz (115.2 kg) 262 lb 4.8 oz (119 kg)    Estimated body mass index is 43.65 kg/m as calculated from the following:   Height as of this encounter: 5' 5 (1.651 m).   Weight as of this encounter: 262 lb 4.8 oz (119 kg).  LABS: CBC:    Component Value Date/Time   WBC 11.8 (H) 04/27/2023 1004   HGB 11.4 (L) 04/27/2023 1004   HGB 10.7 (L) 05/15/2022 1440   HCT 35.0 (L) 04/27/2023 1004   HCT 33.4 (L) 05/15/2022 1440   PLT 280 04/27/2023 1004    PLT 394 05/15/2022 1440   MCV 91.9 04/27/2023 1004   MCV 89 05/15/2022 1440   MCV 96 07/12/2011 0919   NEUTROABS 9.1 (H) 04/27/2023 1004   NEUTROABS 7.5 (H) 07/12/2011 0919   LYMPHSABS 1.5 04/27/2023 1004   LYMPHSABS 1.2 07/12/2011 0919   MONOABS 0.7 04/27/2023 1004   MONOABS 0.8 (H) 07/12/2011 0919   EOSABS 0.3 04/27/2023 1004   EOSABS 0.4 07/12/2011 0919   BASOSABS 0.1 04/27/2023 1004   BASOSABS 0.1 07/12/2011 0919   Comprehensive Metabolic Panel:    Component Value Date/Time   NA 132 (L) 04/30/2023 0621   NA 141 07/12/2011 0919   K 4.9 04/30/2023 0621   K 4.4 07/12/2011 0919   CL 95 (L) 04/30/2023 0621   CL 102 07/12/2011 0919   CO2 26 04/30/2023 0621   CO2 29 07/12/2011 0919   BUN 44 (H) 04/30/2023 0621   BUN 16 05/05/2013 1022   CREATININE 1.24 04/30/2023 0621   CREATININE 0.71 05/05/2013 1022   GLUCOSE 195 (H) 04/30/2023 0621   GLUCOSE 76 07/12/2011 0919   CALCIUM  8.9 04/30/2023 0621   CALCIUM  8.8 07/12/2011 0919   AST 17 04/17/2023 0712   ALT 34 04/17/2023 0712   ALKPHOS 129 (H) 04/17/2023 0712   BILITOT <0.2 04/17/2023 0712   PROT 6.6 04/17/2023 9287  ALBUMIN  2.4 (L) 04/17/2023 0712    RADIOGRAPHIC STUDIES: MR BRAIN WO CONTRAST Result Date: 04/15/2023 CLINICAL DATA:  Mental status change, unknown cause EXAM: MRI HEAD WITHOUT CONTRAST TECHNIQUE: Multiplanar, multiecho pulse sequences of the brain and surrounding structures were obtained without intravenous contrast. COMPARISON:  Brain MRI 01/18/2023 FINDINGS: Brain: Negative for an acute infarct. No hemorrhage. No hydrocephalus. No extra-axial fluid collection. No mass effect. No mass lesion. There is a background of mild chronic microvascular ischemic change. Redemonstrated are scattered microhemorrhages in the supratentorial brain, unchanged from prior exam. Vascular: Normal flow voids. Skull and upper cervical spine: Normal marrow signal. Sinuses/Orbits: No middle ear or mastoid effusion. Paranasal sinuses  are clear. Orbits are unremarkable. Other: None. IMPRESSION: No acute intracranial process. Electronically Signed   By: Lyndall Gore M.D.   On: 04/15/2023 16:12    PERFORMANCE STATUS (ECOG) : 3 - Symptomatic, >50% confined to bed  Review of Systems Unless otherwise noted, a complete review of systems is negative.  Physical Exam General: NAD Pulmonary: Unlabored Extremities: B. BKAs Skin: no rashes Neurological: Weakness, some confusion  IMPRESSION: Follow-up visit.  Patient with prolonged hospitalization.  Status post cardioversion on 04/23/2023.  Hospitalization has been complicated by intermittent delirium.  Now on bedtime Seroquel .  Patient is currently mildly confused.  Thinks he is on a ship and in the wrong room.  Patient continues to have multiple medical problems potentially complicating options for cancer care.  Previous plan was to establish care at the cancer center following discharge from the hospital.  Disposition unclear.  I attempted to call patient's wife but was unsuccessful at reaching her.  PLAN: -Continue current scope of treatment -Dispo unclear -Will plan to reach out to patient's wife to clarify goals   Time Total: 15 minutes  Visit consisted of counseling and education dealing with the complex and emotionally intense issues of symptom management and palliative care in the setting of serious and potentially life-threatening illness.Greater than 50%  of this time was spent counseling and coordinating care related to the above assessment and plan.  Signed by: Fonda Mower, PhD, NP-C

## 2023-04-30 NOTE — Progress Notes (Signed)
 PROGRESS NOTE    [name].  FMW:[id number] DOB: [birth year] DOA: 03/28/2023 PCP: Auston Reyes BIRCH, MD    Brief Narrative:   [name]. is a 67 y.o. male with medical history significant of atrial fibrillation, asthma, congestive heart failure, COPD, coronary artery disease, depression, diabetes mellitus, gout, hyperlipidemia, hypertension, hypogonadism in male, morbid obesity, peripheral vascular disease, OSA on CPAP at night, esophageal cancer status post chemo and G-tube placement.  Patient currently being managed for hyperkalemia, severe dehydration and AKI and acute metabolic encephalopathy. MRI of the brain was negative, ammonia level was also negative. Patient was treated with Lokelma  for hypercalcemia.  Patient was also started on oral amiodarone  for atrial flutter with RVR.  Followed by cardiology. 12/21.  Patient developed urinary retention again, Foley catheter was anchored after repeat I&O cath. 12/22.  Potassium still high, continued on Lokelma , also added a dose of Lasix , give a dose of albumin  follow-up blood pressure. 12/30: Potassium up to 5.6 12/31: Potassium improved to 4.9  Assessment & Plan:   Principal Problem:   Hyperkalemia Active Problems:   Delirium   Acute kidney injury (HCC)   Malnutrition of moderate degree   Acute hyperkalemia   Diarrhea   Perinephric fluid collection   Persistent atrial fibrillation (HCC)   Typical atrial flutter (HCC)   Diarrhea of infectious origin   Atrial flutter with rapid ventricular response (HCC)   Malignant neoplasm of lower third of esophagus (HCC)   Palliative care encounter   SOB (shortness of breath)  Acute on chronic persistent atrial flutter with rvr Intermittent hypotension. Patient underwent successful cardioversion 04/23/2023 Continue metoprolol  Diltiazem  was discontinued on 12/22 due to hypotension Remains in NSR   Acute kidney injury secondary to  dehydration Hyperkalemia. Hyponatremia. This is secondary to dehydration from diarrhea.  Renal function had improved after fluids.  Hyperkalemia recurred on 12/30.  1 dose of Lokelma .  Hyperkalemia resolved.   Delirium secondary to acute metabolic encephalopathy.-Improving Klebsiella UTI Patient has had recurrent UTI during and before hospitalization.  Unclear whether the latest urinalysis drawn on 12/30 represents true infection but it is leukocyte and nitrate positive. Plan: IV Rocephin  Follow cultures   Acute diarrhea secondary to C. difficile colitis. Patient is status post 10 days of Dificid  followed with 10-day course of vancomycin .   Status post flexible sigmoidoscopy on 12/11.  Normal-appearing mucosa.  Biopsies taken.  Diarrhea has been improving as of 12/12 Plan: C. difficile infection has essentially resolved.  Patient has been hospitalized over 30 days so isolation precautions have been removed.  Persistent diarrhea likely secondary to tube feeds.  Lomotil  and Imodium  initiated.   Possible right perinephric hematoma --Described as possible abscess by radiology but per secure chat communication w/ Dr. Penne on 12/9, this is a chornic complex hematoma dating back over a year, nothing that has been drainable thus far, possible underlying renal mass, and in light of significant comorbidities and as he is asymptomatic inpatient evaluation has been deferred Continue to follow-up with urologist as an outpatient     Urinary retention secondary to benign prostate hypertrophy. Patient developed significant urinary retention since last night with residual over 500 mL, patient had In-N-Out cath multiple times, Foley catheter was anchored again.  Monitor UOP   Chronic diastolic congestive heart failure --Recent EF of 55% on echo obtained on November 2024 No exacerbation.   COPD Continue as needed nebulization    Coronary artery disease,  Continue aspirin  therapy   Diabetes  mellitus type  2 with hyperglycemia Continue Semglee  30 units nightly (previous dose 35 units)   Hypothyroidism Continue levothyroxine    Moderate protein calorie malnutrition. Nutrition Problem: Moderate Malnutrition Etiology: chronic illness (esopahgeal mass, likely malginant) Signs/Symptoms: mild fat depletion, mild muscle depletion Interventions: Tube feeding   Peripheral vascular disease S/p bilateral BKAs --Continue aspirin   --Patient is allergic to statin therapy   OSA on CPAP at night,  Continue CPAP   Esophageal cancer status post chemo and G-tube placement. Continue bolus tube feeding.  DVT prophylaxis: Apixaban  Code Status: Full Family Communication: Wife Joen via phone [phone] on 12/28, at bedside 12/30 Disposition Plan: Status is: Inpatient Remains inpatient appropriate because: Unsafe discharge plan.  Pending insurance authorization for skilled nursing facility.  Medically appropriate for discharge to SNF   Level of care: Telemetry Medical  Consultants:  None  Procedures:  Cardioversion  Antimicrobials: None   Subjective: Seen and examined.  Fairly lucid this morning.  Pleasant and communicative.  Objective: Vitals:   04/30/23 0017 04/30/23 0340 04/30/23 0800 04/30/23 1230  BP: 101/64 115/74 110/66 (!) 95/57  Pulse: 72 73  77  Resp: 20 17 20 19   Temp: 97.9 F (36.6 C) 97.9 F (36.6 C) 97.9 F (36.6 C) 97.8 F (36.6 C)  TempSrc:  Oral Axillary Axillary  SpO2: 93% 94% 95% 94%  Weight:  119 kg    Height:        Intake/Output Summary (Last 24 hours) at 04/30/2023 1359 Last data filed at 04/30/2023 0340 Gross per 24 hour  Intake 100 ml  Output 800 ml  Net -700 ml   Filed Weights   04/27/23 0445 04/29/23 0500 04/30/23 0340  Weight: 115.8 kg 115.2 kg 119 kg    Examination:  General exam: No acute distress.  Appears chronically ill Respiratory system: Bibasilar crackles.  Normal work of breathing.  2 L Cardiovascular system:  S1-S2, RRR, no murmurs, no pedal edema Gastrointestinal system: Soft, NT/ND, + PEG tube Central nervous system: Alert.  Oriented x 2.  No focal deficits Extremities: Bilateral lower extremity amputations Skin: No rashes, lesions or ulcers Psychiatry: Judgement and insight appear normal. Mood & affect appropriate.     Data Reviewed: I have personally reviewed following labs and imaging studies  CBC: Recent Labs  Lab 04/24/23 0616 04/25/23 0503 04/26/23 0602 04/27/23 1004  WBC 13.9* 10.1 11.1* 11.8*  NEUTROABS 11.5* 7.6 8.9* 9.1*  HGB 11.4* 11.1* 11.3* 11.4*  HCT 35.0* 35.0* 34.2* 35.0*  MCV 90.0 92.6 89.8 91.9  PLT 294 254 248 280   Basic Metabolic Panel: Recent Labs  Lab 04/24/23 0616 04/25/23 0503 04/26/23 0602 04/27/23 1004 04/28/23 0446 04/29/23 0322 04/30/23 0621  NA 132*   < > 134* 134* 134* 132* 132*  K 5.3*   < > 5.1 4.9 4.9 5.6* 4.9  CL 96*   < > 99 99 98 94* 95*  CO2 26   < > 25 26 26 28 26   GLUCOSE 244*   < > 211* 160* 165* 170* 195*  BUN 29*   < > 26* 32* 33* 38* 44*  CREATININE 0.95   < > 1.01 1.05 1.16 1.21 1.24  CALCIUM  9.3   < > 9.3 9.1 9.2 8.9 8.9  MG 2.4  --   --   --   --  2.7* 2.4  PHOS 4.2  --   --   --   --   --  4.7*   < > = values in this interval not displayed.  GFR: Estimated Creatinine Clearance: 68.1 mL/min (by C-G formula based on SCr of 1.24 mg/dL). Liver Function Tests: No results for input(s): AST, ALT, ALKPHOS, BILITOT, PROT, ALBUMIN  in the last 168 hours. No results for input(s): LIPASE, AMYLASE in the last 168 hours. No results for input(s): AMMONIA in the last 168 hours. Coagulation Profile: No results for input(s): INR, PROTIME in the last 168 hours. Cardiac Enzymes: No results for input(s): CKTOTAL, CKMB, CKMBINDEX, TROPONINI in the last 168 hours. BNP (last 3 results) No results for input(s): PROBNP in the last 8760 hours. HbA1C: No results for input(s): HGBA1C in the last 72  hours. CBG: Recent Labs  Lab 04/29/23 1155 04/29/23 1640 04/29/23 2127 04/30/23 0934 04/30/23 1233  GLUCAP 227* 157* 213* 165* 197*   Lipid Profile: No results for input(s): CHOL, HDL, LDLCALC, TRIG, CHOLHDL, LDLDIRECT in the last 72 hours. Thyroid  Function Tests: No results for input(s): TSH, T4TOTAL, FREET4, T3FREE, THYROIDAB in the last 72 hours. Anemia Panel: No results for input(s): VITAMINB12, FOLATE, FERRITIN, TIBC, IRON, RETICCTPCT in the last 72 hours. Sepsis Labs: Recent Labs  Lab 04/27/23 1004  PROCALCITON <0.10    No results found for this or any previous visit (from the past 240 hours).       Radiology Studies: No results found.      Scheduled Meds:  amiodarone   200 mg Oral BID   Followed by   NOREEN ON 05/07/2023] amiodarone   200 mg Oral Daily   apixaban   5 mg Per Tube BID   ascorbic acid   500 mg Per Tube Daily   Chlorhexidine  Gluconate Cloth  6 each Topical Daily   cholecalciferol   2,000 Units Per Tube Daily   clonazepam   0.25 mg Per Tube BID   cyanocobalamin   10,000 mcg Per Tube Daily   diphenoxylate -atropine   1 tablet Per Tube QID   ezetimibe   10 mg Per Tube Daily   feeding supplement (GLUCERNA 1.5 CAL)  237 mL Per Tube 6 X Daily   ferrous sulfate   300 mg Per Tube Daily   fiber supplement (BANATROL TF)  60 mL Per Tube BID   free water   30 mL Per Tube 6 X Daily   furosemide   20 mg Per Tube Daily   insulin  aspart  0-5 Units Subcutaneous QHS   insulin  aspart  0-9 Units Subcutaneous TID WC   insulin  glargine-yfgn  40 Units Subcutaneous QHS   levothyroxine   25 mcg Per Tube Q0600   metoprolol  tartrate  12.5 mg Oral BID   multivitamin with minerals  1 tablet Per Tube Daily   pramipexole   2 mg Per Tube QHS   primidone   250 mg Per Tube BID   QUEtiapine   25 mg Per Tube QHS   Continuous Infusions:  cefTRIAXone  (ROCEPHIN )  IV 1 g (04/29/23 1529)     LOS: 33 days      Calvin KATHEE Robson, MD [facility]   If 7PM-7AM, please contact night-coverage  04/30/2023, 1:59 PM

## 2023-04-30 NOTE — Consult Note (Addendum)
 PHARMACY CONSULT NOTE - ELECTROLYTES  Pharmacy Consult for Electrolyte Monitoring and Replacement   Recent Labs: Height: 5' 5 (165.1 cm) (Bilateral BKAs) Weight: 119 kg (262 lb 4.8 oz) IBW/kg (Calculated) : 61.5 Estimated Creatinine Clearance: 68.1 mL/min (by C-G formula based on SCr of 1.24 mg/dL).  Potassium (mmol/L)  Date Value  04/30/2023 4.9  07/12/2011 4.4   Magnesium  (mg/dL)  Date Value  87/68/7975 2.4   Calcium  (mg/dL)  Date Value  87/68/7975 8.9   Calcium , Total (mg/dL)  Date Value  96/85/7986 8.8   Albumin  (g/dL)  Date Value  87/81/7975 2.4 (L)   Phosphorus (mg/dL)  Date Value  87/68/7975 4.7 (H)   Sodium (mmol/L)  Date Value  04/30/2023 132 (L)  07/12/2011 141   Corrected calcium : 10.18 mg/dL  Assessment: [name]. is a 67 y.o. male presenting with hyperkalemia, severe dehydration and AKI. PMH significant for atrial fibrillation, asthma, congestive heart failure, COPD, coronary artery disease, depression, diabetes mellitus, gout, hyperlipidemia, hypertension, hypogonadism in male, morbid obesity, peripheral vascular disease, OSA on CPAP at night, esophageal cancer status post chemo and G-tube placement. Pharmacy has been consulted to monitor and replace electrolytes.  Diet: NPO MIVF: Free water  30 mL per tube 6x/day  Goal of Therapy:  Potassium 4.0 - 5.1 mmol/L Magnesium  2.0 - 2.4 mg/dL All Other Electrolytes WNL  Diuretics: furosemide  20 mg per tube daily  Plan:  ---K+ 4.9 today s/p Lokelma  5 gm per tube x 1  ---No electrolyte replacement indicated at this time  ---F/u with AM labs   Thank you for allowing pharmacy to be a part of this patient's care.  Evonnie Nieves, PharmD Pharmacy Resident  04/30/2023 7:45 AM

## 2023-04-30 NOTE — Progress Notes (Signed)
Bed alarm going off pt has shifted self back to the end of the bed and refuses to go back to middle of bed, wife not at bedside. Called wife and states that she is on her way back to the floor. Call bed with in reach with bed alarm set.

## 2023-04-30 NOTE — Progress Notes (Signed)
 Physical Therapy Treatment Patient Details Name: [name]. MRN: [id number] DOB: 07/03/1955 Today's Date: 04/30/2023   History of Present Illness Pt is a 67 yo male pt admitted for hyperkalemia, AKI, UTI, and perinephric abscess. PMH includes Afib, asthma, CHF, COPD, CAD, depression, PVD, and esophageal cancer. Currently with PEG tube placed. Of note B LE BKA.    PT Comments  Patient is agreeable to PT session. He was requesting to get out of bed. Bed mobility performed with supervision. Patient completed transfer from bed to recliner chair using transfer board with intermittent +2 person assistance. Patient typically reports using hoyer lift to get back to bed at home and will need hoyer lift to get back to bed and for routine out of bed mobility for safety. The patient does not appear to be at his baseline level of independence and could benefit from continued PT to decrease caregiver burden. Recommend rehabilitation < 3 hours/day after this hospital stay.    If plan is discharge home, recommend the following: Two people to help with walking and/or transfers;A lot of help with bathing/dressing/bathroom;Assistance with cooking/housework;Assistance with feeding;Direct supervision/assist for medications management;Direct supervision/assist for financial management;Assist for transportation;Help with stairs or ramp for entrance;Supervision due to cognitive status   Can travel by private vehicle     No  Equipment Recommendations  None recommended by PT    Recommendations for Other Services       Precautions / Restrictions Precautions Precautions: Fall Precaution Comments: BLE BKA, no prosthetics in room Restrictions Weight Bearing Restrictions Per Provider Order: No     Mobility  Bed Mobility Overal bed mobility: Needs Assistance Bed Mobility: Supine to Sit     Supine to sit: Supervision          Transfers Overall transfer level: Needs assistance Equipment used:  Sliding board Transfers: Bed to chair/wheelchair/BSC            Lateral/Scoot Transfers: Mod assist, With slide board General transfer comment: intermittent +2 person assistance with bed height elevated. cues for hand placement and safe technique. the patient will need a hoyer lift to get back to bed with sling in the room (secure chat provided with MD and nurse).    Ambulation/Gait                   Stairs             Wheelchair Mobility     Tilt Bed    Modified Rankin (Stroke Patients Only)       Balance Overall balance assessment: Needs assistance Sitting-balance support: Bilateral upper extremity supported, Feet unsupported Sitting balance-Leahy Scale: Fair                                      Cognition Arousal: Alert Behavior During Therapy: WFL for tasks assessed/performed Overall Cognitive Status: Impaired/Different from baseline Area of Impairment: Orientation, Following commands, Problem solving                 Orientation Level: Disoriented to, Situation, Time, Place   Memory: Decreased recall of precautions, Decreased short-term memory Following Commands: Follows one step commands with increased time Safety/Judgement: Decreased awareness of safety, Decreased awareness of deficits     General Comments: patient is cooperative. confusion seems intermittent        Exercises      General Comments General comments (skin integrity, edema, etc.): encouraged a  pillow to promote bilateral knee extension but patient declined.      Pertinent Vitals/Pain Pain Assessment Pain Assessment: No/denies pain    Home Living                          Prior Function            PT Goals (current goals can now be found in the care plan section) Acute Rehab PT Goals Patient Stated Goal: to go outside PT Goal Formulation: With patient Time For Goal Achievement: 05/06/23 Potential to Achieve Goals: Fair Progress  towards PT goals: Progressing toward goals    Frequency    Min 1X/week      PT Plan      Co-evaluation PT/OT/SLP Co-Evaluation/Treatment: Yes Reason for Co-Treatment: To address functional/ADL transfers;Necessary to address cognition/behavior during functional activity PT goals addressed during session: Mobility/safety with mobility OT goals addressed during session: ADL's and self-care      AM-PAC PT 6 Clicks Mobility   Outcome Measure  Help needed turning from your back to your side while in a flat bed without using bedrails?: A Lot Help needed moving from lying on your back to sitting on the side of a flat bed without using bedrails?: A Lot Help needed moving to and from a bed to a chair (including a wheelchair)?: Total Help needed standing up from a chair using your arms (e.g., wheelchair or bedside chair)?: Total Help needed to walk in hospital room?: Total Help needed climbing 3-5 steps with a railing? : Total 6 Click Score: 8    End of Session   Activity Tolerance: Patient tolerated treatment well Patient left: in chair;with call bell/phone within reach;with chair alarm set;with nursing/sitter in room Nurse Communication: Mobility status;Need for lift equipment PT Visit Diagnosis: Muscle weakness (generalized) (M62.81);Other abnormalities of gait and mobility (R26.89)     Time: 0940-1005 PT Time Calculation (min) (ACUTE ONLY): 25 min  Charges:    $Therapeutic Activity: 8-22 mins PT General Charges $$ ACUTE PT VISIT: 1 Visit                     Randine Essex, PT, MPT    Randine LULLA Essex 04/30/2023, 11:18 AM

## 2023-04-30 NOTE — Progress Notes (Signed)
 Chaplain makes visit at pt's wife's request, communicated via Chaplain Melissa, whom pt and wife met yesterday. Pt is confused and upset, and eventually agrees to speak to chaplain about his concerns. He shares life review and his desires for more connection with wife. Chaplain provides listening and support; pt is convinced that his wife does not love him, likely because of childhood trauma he experienced. Chaplain visits with wife [name] as well and she performs life review; she too has a history of childhood trauma as well as adult losses. Chaplain and [name] return to room, where pt is agitated and more confused. RN Renda and [name] discuss options while chaplain plays pt's favorite artist, Nicolette Agent, in attempt to calm him. Chaplain assists in getting pt re-situated in bed and stays until he is calm and appears sleepy. Chaplain notifies pt and wife that she'll be on overnight and another chaplain will arrive in the morning.

## 2023-04-30 NOTE — Progress Notes (Signed)
 Occupational Therapy Treatment Patient Details Name: [name]. MRN: [id number] DOB: 01-22-1956 Today's Date: 04/30/2023   History of present illness Pt is a 67 yo male pt admitted for hyperkalemia, AKI, UTI, and perinephric abscess. PMH includes Afib, asthma, CHF, COPD, CAD, depression, PVD, and esophageal cancer. Currently with PEG tube placed. Of note B LE BKA.   OT comments  Mr Brill was seen for OT/PT co- treatment on this date. Upon arrival to room pt in bed with RN at bedside, agreeable to tx. Pt continues to demonstrate waxing and waning cognition. Pt requires SBA for bed mobility. MOD A for slide board t/f bed>chair from elevated bed height with intermittent +2 assist. Pt making good progress toward goals, will continue to follow POC. Discharge recommendation remains appropriate.       If plan is discharge home, recommend the following:  A lot of help with walking and/or transfers;A lot of help with bathing/dressing/bathroom   Equipment Recommendations  Hospital bed    Recommendations for Other Services      Precautions / Restrictions Precautions Precautions: Fall Precaution Comments: BLE BKA, no prosthetics in room Restrictions Weight Bearing Restrictions Per Provider Order: No       Mobility Bed Mobility Overal bed mobility: Needs Assistance Bed Mobility: Supine to Sit     Supine to sit: Supervision          Transfers Overall transfer level: Needs assistance Equipment used: Sliding board Transfers: Bed to chair/wheelchair/BSC            Lateral/Scoot Transfers: Mod assist, With slide board, From elevated surface General transfer comment: intermittent +2 assist     Balance Overall balance assessment: Needs assistance Sitting-balance support: Bilateral upper extremity supported, Feet unsupported Sitting balance-Leahy Scale: Fair                                     ADL either performed or assessed with clinical judgement    ADL Overall ADL's : Needs assistance/impaired                                       General ADL Comments: MOD A for simulated BSC t/f, +2 for safety      Cognition Arousal: Alert Behavior During Therapy: WFL for tasks assessed/performed Overall Cognitive Status: Impaired/Different from baseline Area of Impairment: Orientation, Following commands, Problem solving                 Orientation Level: Disoriented to, Situation     Following Commands: Follows one step commands with increased time Safety/Judgement: Decreased awareness of safety, Decreased awareness of deficits     General Comments: Pt [place].. continues to have waxing and waning cognition                   Pertinent Vitals/ Pain       Pain Assessment Pain Assessment: No/denies pain   Frequency  Min 1X/week        Progress Toward Goals  OT Goals(current goals can now be found in the care plan section)  Progress towards OT goals: Progressing toward goals  Acute Rehab OT Goals Patient Stated Goal: to go outside OT Goal Formulation: With patient Time For Goal Achievement: 05/10/23 Potential to Achieve  Goals: Good ADL Goals Pt Will Perform Lower Body Dressing: with min assist;sitting/lateral leans;bed level Pt Will Transfer to Toilet: with transfer board;bedside commode;with mod assist Pt Will Perform Toileting - Clothing Manipulation and hygiene: with min assist;sitting/lateral leans Additional ADL Goal #1: Pt will tolerate grooming tasks seated EOB wiht setup and supv>26min.  Plan      Co-evaluation    PT/OT/SLP Co-Evaluation/Treatment: Yes Reason for Co-Treatment: To address functional/ADL transfers;Necessary to address cognition/behavior during functional activity PT goals addressed during session: Mobility/safety with mobility OT goals addressed during session: ADL's and self-care      AM-PAC OT  6 Clicks Daily Activity     Outcome Measure   Help from another person eating meals?: None Help from another person taking care of personal grooming?: A Lot Help from another person toileting, which includes using toliet, bedpan, or urinal?: A Lot Help from another person bathing (including washing, rinsing, drying)?: A Lot Help from another person to put on and taking off regular upper body clothing?: None Help from another person to put on and taking off regular lower body clothing?: A Lot 6 Click Score: 16    End of Session    OT Visit Diagnosis: Muscle weakness (generalized) (M62.81);Other symptoms and signs involving cognitive function   Activity Tolerance Patient tolerated treatment well   Patient Left in chair;with call bell/phone within reach;with chair alarm set;with nursing/sitter in room   Nurse Communication          Time: 0940-1005 OT Time Calculation (min): 25 min  Charges: OT General Charges $OT Visit: 1 Visit OT Treatments $Self Care/Home Management : 8-22 mins  Elston Slot, M.S. OTR/L  04/30/23, 10:59 AM  ascom [phone]

## 2023-04-30 NOTE — Plan of Care (Signed)
  Problem: Education: Goal: Ability to describe self-care measures that may prevent or decrease complications (Diabetes Survival Skills Education) will improve Outcome: Progressing   Problem: Coping: Goal: Ability to adjust to condition or change in health will improve Outcome: Progressing   Problem: Fluid Volume: Goal: Ability to maintain a balanced intake and output will improve Outcome: Progressing   Problem: Health Behavior/Discharge Planning: Goal: Ability to identify and utilize available resources and services will improve Outcome: Progressing   Problem: Metabolic: Goal: Ability to maintain appropriate glucose levels will improve Outcome: Progressing   Problem: Nutritional: Goal: Maintenance of adequate nutrition will improve Outcome: Progressing Goal: Progress toward achieving an optimal weight will improve Outcome: Progressing   Problem: Skin Integrity: Goal: Risk for impaired skin integrity will decrease Outcome: Progressing   Problem: Education: Goal: Knowledge of General Education information will improve Description: Including pain rating scale, medication(s)/side effects and non-pharmacologic comfort measures Outcome: Progressing   Problem: Health Behavior/Discharge Planning: Goal: Ability to manage health-related needs will improve Outcome: Progressing   Problem: Clinical Measurements: Goal: Ability to maintain clinical measurements within normal limits will improve Outcome: Progressing Goal: Will remain free from infection Outcome: Progressing Goal: Diagnostic test results will improve Outcome: Progressing Goal: Respiratory complications will improve Outcome: Progressing Goal: Cardiovascular complication will be avoided Outcome: Progressing   Problem: Activity: Goal: Risk for activity intolerance will decrease Outcome: Progressing   Problem: Nutrition: Goal: Adequate nutrition will be maintained Outcome: Progressing   Problem: Coping: Goal:  Level of anxiety will decrease Outcome: Progressing   Problem: Elimination: Goal: Will not experience complications related to bowel motility Outcome: Progressing Goal: Will not experience complications related to urinary retention Outcome: Progressing   Problem: Pain Management: Goal: General experience of comfort will improve Outcome: Progressing   Problem: Safety: Goal: Ability to remain free from injury will improve Outcome: Progressing   Problem: Skin Integrity: Goal: Risk for impaired skin integrity will decrease Outcome: Progressing

## 2023-04-30 NOTE — Plan of Care (Signed)
  Problem: Coping: Goal: Ability to adjust to condition or change in health will improve Outcome: Progressing   Problem: Fluid Volume: Goal: Ability to maintain a balanced intake and output will improve Outcome: Progressing   Problem: Health Behavior/Discharge Planning: Goal: Ability to identify and utilize available resources and services will improve Outcome: Progressing   Problem: Metabolic: Goal: Ability to maintain appropriate glucose levels will improve Outcome: Progressing   Problem: Nutritional: Goal: Maintenance of adequate nutrition will improve Outcome: Progressing   Problem: Coping: Goal: Level of anxiety will decrease Outcome: Progressing   Problem: Elimination: Goal: Will not experience complications related to urinary retention Outcome: Progressing   Problem: Safety: Goal: Ability to remain free from injury will improve Outcome: Progressing

## 2023-04-30 NOTE — Progress Notes (Signed)
 Pt sitting on edge of bed refusing to sit in the center of the bed or lay down. Discussed with pt that the reason to sit in the center or lay down is to prevent pt from falling over. Pt states You guys just want me to lay down so that you can tie me down and give me medication. Reassured pt that those measures are last resort and the only request if for pt to move more to the center of bed. Wife called and talked with pt, pt now agreeable to moving to center of bed and dangling on side of bed. Nurse tech, care nurse and 2 other nurses present and placed the pt in the center of bed. Wife is on the way to the bedside, bed alarm is set with call bell in reach. Floor matts are down.

## 2023-05-01 ENCOUNTER — Inpatient Hospital Stay: Payer: Medicare Other

## 2023-05-01 DIAGNOSIS — E875 Hyperkalemia: Secondary | ICD-10-CM | POA: Diagnosis not present

## 2023-05-01 LAB — BASIC METABOLIC PANEL
Anion gap: 10 (ref 5–15)
BUN: 47 mg/dL — ABNORMAL HIGH (ref 8–23)
CO2: 26 mmol/L (ref 22–32)
Calcium: 8.9 mg/dL (ref 8.9–10.3)
Chloride: 96 mmol/L — ABNORMAL LOW (ref 98–111)
Creatinine, Ser: 1.36 mg/dL — ABNORMAL HIGH (ref 0.61–1.24)
GFR, Estimated: 57 mL/min — ABNORMAL LOW (ref >60)
Glucose, Bld: 185 mg/dL — ABNORMAL HIGH (ref 70–99)
Potassium: 5.3 mmol/L — ABNORMAL HIGH (ref 3.5–5.1)
Sodium: 132 mmol/L — ABNORMAL LOW (ref 135–145)

## 2023-05-01 LAB — MAGNESIUM: Magnesium: 2.5 mg/dL — ABNORMAL HIGH (ref 1.7–2.4)

## 2023-05-01 LAB — PHOSPHORUS: Phosphorus: 4.2 mg/dL (ref 2.5–4.6)

## 2023-05-01 LAB — GLUCOSE, CAPILLARY
Glucose-Capillary: 201 mg/dL — ABNORMAL HIGH (ref 70–99)
Glucose-Capillary: 195 mg/dL — ABNORMAL HIGH (ref 70–99)
Glucose-Capillary: 190 mg/dL — ABNORMAL HIGH (ref 70–99)
Glucose-Capillary: 208 mg/dL — ABNORMAL HIGH (ref 70–99)

## 2023-05-01 MED ORDER — SODIUM ZIRCONIUM CYCLOSILICATE 10 G PO PACK
10.0000 g | PACK | Freq: Once | ORAL | Status: AC
  Administered 2023-05-01: 10 g via ORAL
  Filled 2023-05-01: qty 1

## 2023-05-01 MED ORDER — INSULIN GLARGINE-YFGN 100 UNIT/ML ~~LOC~~ SOLN
43.0000 [IU] | Freq: Every day | SUBCUTANEOUS | Status: DC
  Filled 2023-05-01 (×2): qty 0.43

## 2023-05-01 NOTE — Progress Notes (Signed)
   05/01/23 1900  Spiritual Encounters  Type of Visit Initial  Care provided to: Patient  Referral source Patient request  Spiritual Framework  Presenting Themes Impactful experiences and emotions   Chaplain responded to request to visit patient who struggling with current perceived situation and relationships. Chaplain allowed patient to voice his struggles.

## 2023-05-01 NOTE — Plan of Care (Signed)
  Problem: Education: Goal: Ability to describe self-care measures that may prevent or decrease complications (Diabetes Survival Skills Education) will improve Outcome: Progressing   Problem: Coping: Goal: Ability to adjust to condition or change in health will improve Outcome: Progressing   Problem: Fluid Volume: Goal: Ability to maintain a balanced intake and output will improve Outcome: Progressing   Problem: Health Behavior/Discharge Planning: Goal: Ability to identify and utilize available resources and services will improve Outcome: Progressing   Problem: Metabolic: Goal: Ability to maintain appropriate glucose levels will improve Outcome: Progressing   Problem: Nutritional: Goal: Maintenance of adequate nutrition will improve Outcome: Progressing Goal: Progress toward achieving an optimal weight will improve Outcome: Progressing   Problem: Skin Integrity: Goal: Risk for impaired skin integrity will decrease Outcome: Progressing   Problem: Education: Goal: Knowledge of General Education information will improve Description: Including pain rating scale, medication(s)/side effects and non-pharmacologic comfort measures Outcome: Progressing   Problem: Health Behavior/Discharge Planning: Goal: Ability to manage health-related needs will improve Outcome: Progressing   Problem: Clinical Measurements: Goal: Ability to maintain clinical measurements within normal limits will improve Outcome: Progressing Goal: Will remain free from infection Outcome: Progressing Goal: Diagnostic test results will improve Outcome: Progressing Goal: Respiratory complications will improve Outcome: Progressing Goal: Cardiovascular complication will be avoided Outcome: Progressing   Problem: Activity: Goal: Risk for activity intolerance will decrease Outcome: Progressing   Problem: Nutrition: Goal: Adequate nutrition will be maintained Outcome: Progressing   Problem: Coping: Goal:  Level of anxiety will decrease Outcome: Progressing   Problem: Elimination: Goal: Will not experience complications related to bowel motility Outcome: Progressing Goal: Will not experience complications related to urinary retention Outcome: Progressing   Problem: Pain Management: Goal: General experience of comfort will improve Outcome: Progressing   Problem: Safety: Goal: Ability to remain free from injury will improve Outcome: Progressing   Problem: Skin Integrity: Goal: Risk for impaired skin integrity will decrease Outcome: Progressing

## 2023-05-01 NOTE — Progress Notes (Signed)
 PROGRESS NOTE    [name].  FMW:[id number] DOB: 06-17-1955 DOA: 03/28/2023 PCP: Auston Reyes BIRCH, MD    Brief Narrative:   [name]. is a 68 y.o. male with medical history significant of atrial fibrillation, asthma, congestive heart failure, COPD, coronary artery disease, depression, diabetes mellitus, gout, hyperlipidemia, hypertension, hypogonadism in male, morbid obesity, peripheral vascular disease, OSA on CPAP at night, esophageal cancer status post chemo and G-tube placement.  Patient currently being managed for hyperkalemia, severe dehydration and AKI and acute metabolic encephalopathy. MRI of the brain was negative, ammonia level was also negative. Patient was treated with Lokelma  for hypercalcemia.  Patient was also started on oral amiodarone  for atrial flutter with RVR.  Followed by cardiology. 12/21.  Patient developed urinary retention again, Foley catheter was anchored after repeat I&O cath. 12/22.  Potassium still high, continued on Lokelma , also added a dose of Lasix , give a dose of albumin  follow-up blood pressure. 12/30: Potassium up to 5.6 12/31: Potassium improved to 4.9  Assessment & Plan:   Principal Problem:   Hyperkalemia Active Problems:   Delirium   Acute kidney injury (HCC)   Malnutrition of moderate degree   Acute hyperkalemia   Diarrhea   Perinephric fluid collection   Persistent atrial fibrillation (HCC)   Typical atrial flutter (HCC)   Diarrhea of infectious origin   Atrial flutter with rapid ventricular response (HCC)   Malignant neoplasm of lower third of esophagus (HCC)   Palliative care encounter   SOB (shortness of breath)  Acute on chronic persistent atrial flutter with rvr Intermittent hypotension. Patient underwent successful cardioversion 04/23/2023 Continue metoprolol  Diltiazem  was discontinued on 12/22 due to hypotension Remains in NSR   Acute kidney injury secondary to  dehydration Hyperkalemia. Hyponatremia. This is secondary to dehydration from diarrhea.  Renal function had improved after fluids.  Hyperkalemia  is intermittent will give lokelma  today   Delirium secondary to acute metabolic encephalopathy.-Improving Klebsiella UTI Patient has had recurrent UTI during and before hospitalization.  Unclear whether the latest urinalysis drawn on 12/30 represents true infection but it is leukocyte and nitrate positive. Plan: D/c abx and monitor for clear signs/symptoms of infection   Acute diarrhea secondary to C. difficile colitis. Patient is status post 10 days of Dificid  followed with 10-day course of vancomycin .   Status post flexible sigmoidoscopy on 12/11.  Normal-appearing mucosa.  Biopsies taken.  Diarrhea has been improving as of 12/12 Plan: C. difficile infection has essentially resolved.  Patient has been hospitalized over 30 days so isolation precautions have been removed.  Persistent diarrhea likely secondary to tube feeds.  Lomotil  and Imodium  initiated.   Possible right perinephric hematoma --Described as possible abscess by radiology but per secure chat communication w/ Dr. Penne on 12/9, this is a chornic complex hematoma dating back over a year, nothing that has been drainable thus far, possible underlying renal mass, and in light of significant comorbidities and as he is asymptomatic inpatient evaluation has been deferred Continue to follow-up with urologist as an outpatient   Urinary retention secondary to benign prostate hypertrophy. Patient developed significant urinary retention since last night with residual over 500 mL, patient had In-N-Out cath multiple times, Foley catheter was anchored again.  Monitor UOP   Chronic diastolic congestive heart failure --Recent EF of 55% on echo obtained on November 2024 No exacerbation.   COPD Continue as needed nebulization    Coronary artery disease,  Con apixaban  and zetia    Diabetes  mellitus type  2 with hyperglycemia Continue Semglee  will increase from 40 to 43 given persistent hyperglycemia   Hypothyroidism Continue levothyroxine    Moderate protein calorie malnutrition. Nutrition Problem: Moderate Malnutrition Etiology: chronic illness (esopahgeal mass, likely malginant) Signs/Symptoms: mild fat depletion, mild muscle depletion Interventions: Tube feeding   Peripheral vascular disease S/p bilateral BKAs --Continue aspirin   --Patient is allergic to statin therapy   OSA on CPAP at night,  Continue CPAP   Esophageal cancer status post chemo and G-tube placement. Continue bolus tube feeding.  Low back pain Midline tenderness says aggravated by being up in bed, no neurologic symptoms - will check plain film per wife's request   DVT prophylaxis: Apixaban  Code Status: Full Family Communication: [name] via phone 1/1 Disposition Plan: Status is: Inpatient Remains inpatient appropriate because: Unsafe discharge plan.  Pending insurance authorization for skilled nursing facility.  Medically appropriate for discharge to SNF. Insurance denied, wife has appealed   Level of care: Telemetry Medical  Consultants:  None  Procedures:  Cardioversion  Antimicrobials: None   Subjective: Seen and examined. Reports low back pain  Objective: Vitals:   05/01/23 0008 05/01/23 0359 05/01/23 0724 05/01/23 1137  BP: 132/78 118/79 (!) 116/59 (!) 110/55  Pulse: 90 96 (!) 103 81  Resp: 18 16  18   Temp: 97.7 F (36.5 C) 97.6 F (36.4 C) 98.6 F (37 C) 98.6 F (37 C)  TempSrc:   Oral Oral  SpO2: 93% 93% 100% 98%  Weight:  119.1 kg    Height:        Intake/Output Summary (Last 24 hours) at 05/01/2023 1203 Last data filed at 05/01/2023 0359 Gross per 24 hour  Intake --  Output 1050 ml  Net -1050 ml   Filed Weights   04/29/23 0500 04/30/23 0340 05/01/23 0359  Weight: 115.2 kg 119 kg 119.1 kg    Examination:  General exam: No acute distress.  Appears  chronically ill Respiratory system: Bibasilar crackles.  Normal work of breathing.  2 L Cardiovascular system: S1-S2, RRR, no murmurs, no pedal edema Gastrointestinal system: Soft, NT/ND, + PEG tube, mild eryhema around it Central nervous system: Alert.  Oriented x 2.  No focal deficits Extremities: Bilateral lower extremity amputations Skin: No rashes, lesions or ulcers Back: midline tenderness lumbar spine Psychiatry: Judgement and insight appear normal. Mood & affect appropriate.     Data Reviewed: I have personally reviewed following labs and imaging studies  CBC: Recent Labs  Lab 04/25/23 0503 04/26/23 0602 04/27/23 1004  WBC 10.1 11.1* 11.8*  NEUTROABS 7.6 8.9* 9.1*  HGB 11.1* 11.3* 11.4*  HCT 35.0* 34.2* 35.0*  MCV 92.6 89.8 91.9  PLT 254 248 280   Basic Metabolic Panel: Recent Labs  Lab 04/27/23 1004 04/28/23 0446 04/29/23 0322 04/30/23 0621 05/01/23 0523  NA 134* 134* 132* 132* 132*  K 4.9 4.9 5.6* 4.9 5.3*  CL 99 98 94* 95* 96*  CO2 26 26 28 26 26   GLUCOSE 160* 165* 170* 195* 185*  BUN 32* 33* 38* 44* 47*  CREATININE 1.05 1.16 1.21 1.24 1.36*  CALCIUM  9.1 9.2 8.9 8.9 8.9  MG  --   --  2.7* 2.4 2.5*  PHOS  --   --   --  4.7* 4.2   GFR: Estimated Creatinine Clearance: 62.1 mL/min (A) (by C-G formula based on SCr of 1.36 mg/dL (H)). Liver Function Tests: No results for input(s): AST, ALT, ALKPHOS, BILITOT, PROT, ALBUMIN  in the last 168 hours. No results for input(s): LIPASE, AMYLASE in  the last 168 hours. No results for input(s): AMMONIA in the last 168 hours. Coagulation Profile: No results for input(s): INR, PROTIME in the last 168 hours. Cardiac Enzymes: No results for input(s): CKTOTAL, CKMB, CKMBINDEX, TROPONINI in the last 168 hours. BNP (last 3 results) No results for input(s): PROBNP in the last 8760 hours. HbA1C: No results for input(s): HGBA1C in the last 72 hours. CBG: Recent Labs  Lab 04/30/23 0934  04/30/23 1233 04/30/23 1637 04/30/23 2125 05/01/23 0802  GLUCAP 165* 197* 177* 182* 201*   Lipid Profile: No results for input(s): CHOL, HDL, LDLCALC, TRIG, CHOLHDL, LDLDIRECT in the last 72 hours. Thyroid  Function Tests: No results for input(s): TSH, T4TOTAL, FREET4, T3FREE, THYROIDAB in the last 72 hours. Anemia Panel: No results for input(s): VITAMINB12, FOLATE, FERRITIN, TIBC, IRON, RETICCTPCT in the last 72 hours. Sepsis Labs: Recent Labs  Lab 04/27/23 1004  PROCALCITON <0.10    No results found for this or any previous visit (from the past 240 hours).       Radiology Studies: No results found.      Scheduled Meds:  amiodarone   200 mg Oral BID   Followed by   NOREEN ON 05/07/2023] amiodarone   200 mg Oral Daily   apixaban   5 mg Per Tube BID   ascorbic acid   500 mg Per Tube Daily   Chlorhexidine  Gluconate Cloth  6 each Topical Daily   cholecalciferol   2,000 Units Per Tube Daily   clonazepam   0.25 mg Per Tube BID   cyanocobalamin   10,000 mcg Per Tube Daily   diphenoxylate -atropine   1 tablet Per Tube QID   ezetimibe   10 mg Per Tube Daily   feeding supplement (GLUCERNA 1.5 CAL)  237 mL Per Tube 6 X Daily   ferrous sulfate   300 mg Per Tube Daily   fiber supplement (BANATROL TF)  60 mL Per Tube BID   free water   30 mL Per Tube 6 X Daily   furosemide   20 mg Per Tube Daily   insulin  aspart  0-5 Units Subcutaneous QHS   insulin  aspart  0-9 Units Subcutaneous TID WC   insulin  glargine-yfgn  40 Units Subcutaneous QHS   levothyroxine   25 mcg Per Tube Q0600   lidocaine   1 patch Transdermal Q24H   metoprolol  tartrate  12.5 mg Oral BID   multivitamin with minerals  1 tablet Per Tube Daily   pramipexole   2 mg Per Tube QHS   primidone   250 mg Per Tube BID   QUEtiapine   25 mg Per Tube QHS   sodium zirconium cyclosilicate   10 g Oral Once   Continuous Infusions:  cefTRIAXone  (ROCEPHIN )  IV 1 g (04/30/23 1522)     LOS: 34 days       Devaughn KATHEE Ban, MD [facility]   If 7PM-7AM, please contact night-coverage  05/01/2023, 12:03 PM

## 2023-05-01 NOTE — Progress Notes (Signed)
 No visitors Shae and Rickey Barbara.  Please have visitors check in at the desk

## 2023-05-01 NOTE — Progress Notes (Signed)
 Pt agitated and refusing to lay back in bed, pt sitting on edge of bed , pt pulling at lines and foley. Care RN sat with pt for 1 hour, no improvement in behavior. Pt threw water  cup at care nurse and tried to hit staff, prn Geodon  given. Pt now resting comfortably after medication. Pt placed in bed with call bell in reach, bed in lowest position with bed alarm set.

## 2023-05-02 DIAGNOSIS — E875 Hyperkalemia: Secondary | ICD-10-CM | POA: Diagnosis not present

## 2023-05-02 LAB — BASIC METABOLIC PANEL
Anion gap: 12 (ref 5–15)
BUN: 50 mg/dL — ABNORMAL HIGH (ref 8–23)
CO2: 26 mmol/L (ref 22–32)
Calcium: 9 mg/dL (ref 8.9–10.3)
Chloride: 95 mmol/L — ABNORMAL LOW (ref 98–111)
Creatinine, Ser: 1.26 mg/dL — ABNORMAL HIGH (ref 0.61–1.24)
GFR, Estimated: >60 mL/min (ref >60)
Glucose, Bld: 192 mg/dL — ABNORMAL HIGH (ref 70–99)
Potassium: 5 mmol/L (ref 3.5–5.1)
Sodium: 133 mmol/L — ABNORMAL LOW (ref 135–145)

## 2023-05-02 LAB — GLUCOSE, CAPILLARY
Glucose-Capillary: 199 mg/dL — ABNORMAL HIGH (ref 70–99)
Glucose-Capillary: 208 mg/dL — ABNORMAL HIGH (ref 70–99)
Glucose-Capillary: 211 mg/dL — ABNORMAL HIGH (ref 70–99)
Glucose-Capillary: 210 mg/dL — ABNORMAL HIGH (ref 70–99)

## 2023-05-02 LAB — CBC
HCT: 33 % — ABNORMAL LOW (ref 39.0–52.0)
Hemoglobin: 10.6 g/dL — ABNORMAL LOW (ref 13.0–17.0)
MCH: 29.5 pg (ref 26.0–34.0)
MCHC: 32.1 g/dL (ref 30.0–36.0)
MCV: 91.9 fL (ref 80.0–100.0)
Platelets: 240 10*3/uL (ref 150–400)
RBC: 3.59 MIL/uL — ABNORMAL LOW (ref 4.22–5.81)
RDW: 15.1 % (ref 11.5–15.5)
WBC: 9.3 10*3/uL (ref 4.0–10.5)
nRBC: 0 % (ref 0.0–0.2)

## 2023-05-02 MED ORDER — SODIUM ZIRCONIUM CYCLOSILICATE 5 G PO PACK
5.0000 g | PACK | Freq: Once | ORAL | Status: AC
  Administered 2023-05-02: 5 g
  Filled 2023-05-02: qty 1

## 2023-05-02 MED ORDER — HALOPERIDOL 0.5 MG PO TABS
5.0000 mg | ORAL_TABLET | Freq: Once | ORAL | Status: AC
  Administered 2023-05-02: 5 mg
  Filled 2023-05-02: qty 10

## 2023-05-02 MED ORDER — INSULIN GLARGINE-YFGN 100 UNIT/ML ~~LOC~~ SOLN
40.0000 [IU] | Freq: Every day | SUBCUTANEOUS | Status: DC
  Administered 2023-05-02: 40 [IU] via SUBCUTANEOUS
  Filled 2023-05-02 (×2): qty 0.4

## 2023-05-02 NOTE — Progress Notes (Addendum)
 PROGRESS NOTE    [name].  FMW:[id number] DOB: 1956/04/09 DOA: 03/28/2023 PCP: Alec Reyes BIRCH, MD    Brief Narrative:   [name]. is a 68 y.o. male with medical history significant of atrial fibrillation, asthma, congestive heart failure, COPD, coronary artery disease, depression, diabetes mellitus, gout, hyperlipidemia, hypertension, hypogonadism in male, morbid obesity, peripheral vascular disease, OSA on CPAP at night, esophageal cancer status post chemo and G-tube placement.  Patient currently being managed for hyperkalemia, severe dehydration and AKI and acute metabolic encephalopathy. MRI of the brain was negative, ammonia level was also negative. Patient was treated with Lokelma  for hypercalcemia.  Patient was also started on oral amiodarone  for atrial flutter with RVR.  Followed by cardiology. 12/21.  Patient developed urinary retention again, Foley catheter was anchored after repeat I&O cath. 12/22.  Potassium still high, continued on Lokelma , also added a dose of Lasix , give a dose of albumin  follow-up blood pressure. 12/30: Potassium up to 5.6 12/31: Potassium improved to 4.9  Assessment & Plan:   Principal Problem:   Hyperkalemia Active Problems:   Delirium   Acute kidney injury (HCC)   Malnutrition of moderate degree   Acute hyperkalemia   Diarrhea   Perinephric fluid collection   Persistent atrial fibrillation (HCC)   Typical atrial flutter (HCC)   Diarrhea of infectious origin   Atrial flutter with rapid ventricular response (HCC)   Malignant neoplasm of lower third of esophagus (HCC)   Palliative care encounter   SOB (shortness of breath)  Acute on chronic persistent atrial flutter with rvr Intermittent hypotension. Patient underwent successful cardioversion 04/23/2023 Continue metoprolol  Diltiazem  was discontinued on 12/22 due to hypotension Remains in NSR Continue apixaban    Acute kidney injury secondary to  dehydration Hyperkalemia. Hyponatremia. This is secondary to dehydration from diarrhea.  Renal function had improved after fluids.  Hyperkalemia  is intermittent will give lokelma  today   Delirium secondary to acute metabolic encephalopathy.-Improving Klebsiella UTI Patient has had recurrent UTI during and before hospitalization.  Unclear whether the latest urinalysis drawn on 12/30 represents true infection but it is leukocyte and nitrate positive. Plan: Hold,abx and monitor for clear signs/symptoms of infection   Acute diarrhea secondary to C. difficile colitis. Patient is status post 10 days of Dificid  followed with 10-day course of vancomycin .   Status post flexible sigmoidoscopy on 12/11.  Normal-appearing mucosa.  Biopsies taken.  Diarrhea has been improving as of 12/12 Plan: C. difficile infection has essentially resolved.  Patient has been hospitalized over 30 days so isolation precautions have been removed.  Persistent diarrhea likely secondary to tube feeds.  Lomotil  and Imodium  initiated.   Possible right perinephric hematoma --Described as possible abscess by radiology but per secure chat communication w/ Dr. Penne on 12/9, this is a chornic complex hematoma dating back over a year, nothing that has been drainable thus far, possible underlying renal mass, and in light of significant comorbidities and as he is asymptomatic inpatient evaluation has been deferred Continue to follow-up with urologist as an outpatient   Urinary retention secondary to benign prostate hypertrophy. Patient developed significant urinary retention since last night with residual over 500 mL, patient had In-N-Out cath multiple times, Foley catheter was anchored again.  Monitor UOP   Chronic diastolic congestive heart failure --Recent EF of 55% on echo obtained on November 2024 No exacerbation.   COPD Quiescent Continue as needed nebulization    Coronary artery disease,  Con apixaban  and zetia     Diabetes  mellitus type 2 with hyperglycemia Did not get insulin  last night.  - continue basal insulin  with ssi   Hypothyroidism Continue levothyroxine    Moderate protein calorie malnutrition. Nutrition Problem: Moderate Malnutrition Etiology: chronic illness (esopahgeal mass, likely malginant) Signs/Symptoms: mild fat depletion, mild muscle depletion Interventions: Tube feeding   Peripheral vascular disease S/p bilateral BKAs --Continue aspirin   --Patient is allergic to statin therapy   OSA on CPAP at night,  Continue CPAP   Esophageal cancer status post chemo and G-tube placement. Continue bolus tube feeding.  Low back pain Midline tenderness says aggravated by being up in bed, no neurologic symptoms, nothing acute seen on plain film requested by wife  DVT prophylaxis: Apixaban  Code Status: Full Family Communication: [name] via phone 1/1 Disposition Plan: Status is: Inpatient Remains inpatient appropriate because: Unsafe discharge plan.  Pending insurance authorization for skilled nursing facility.  Medically appropriate for discharge to SNF. Insurance denied, wife has appealed, we are awaiting the result of that appeal   Level of care: Telemetry Medical  Consultants:  Palliative, cardiology  Procedures:  Cardioversion  Antimicrobials: None   Subjective: Seen and examined. Sitting up in chair. No complaints  Objective: Vitals:   05/01/23 1822 05/01/23 2030 05/02/23 0417 05/02/23 0802  BP: 105/60 (!) 91/57 114/81 119/63  Pulse: 84 81 81 77  Resp: 18 18 18 17   Temp:  98.4 F (36.9 C) 97.6 F (36.4 C) 98.3 F (36.8 C)  TempSrc:      SpO2: 92% 93% 92% 92%  Weight:   119.6 kg   Height:        Intake/Output Summary (Last 24 hours) at 05/02/2023 1549 Last data filed at 05/02/2023 0426 Gross per 24 hour  Intake 948 ml  Output 1050 ml  Net -102 ml   Filed Weights   04/30/23 0340 05/01/23 0359 05/02/23 0417  Weight: 119 kg 119.1 kg 119.6 kg     Examination:  General exam: No acute distress.  Appears chronically ill Respiratory system: Bibasilar crackles.  Normal work of breathing.  2 L Cardiovascular system: S1-S2, RRR, no murmurs, no pedal edema Gastrointestinal system: Soft, NT/ND, + PEG tube, mild eryhema around it Central nervous system: Alert.  Oriented x 2.  No focal deficits Extremities: Bilateral lower extremity amputations Skin: No rashes, lesions or ulcers Psychiatry: confused, calm    Data Reviewed: I have personally reviewed following labs and imaging studies  CBC: Recent Labs  Lab 04/26/23 0602 04/27/23 1004 05/02/23 0529  WBC 11.1* 11.8* 9.3  NEUTROABS 8.9* 9.1*  --   HGB 11.3* 11.4* 10.6*  HCT 34.2* 35.0* 33.0*  MCV 89.8 91.9 91.9  PLT 248 280 240   Basic Metabolic Panel: Recent Labs  Lab 04/28/23 0446 04/29/23 0322 04/30/23 0621 05/01/23 0523 05/02/23 0529  NA 134* 132* 132* 132* 133*  K 4.9 5.6* 4.9 5.3* 5.0  CL 98 94* 95* 96* 95*  CO2 26 28 26 26 26   GLUCOSE 165* 170* 195* 185* 192*  BUN 33* 38* 44* 47* 50*  CREATININE 1.16 1.21 1.24 1.36* 1.26*  CALCIUM  9.2 8.9 8.9 8.9 9.0  MG  --  2.7* 2.4 2.5*  --   PHOS  --   --  4.7* 4.2  --    GFR: Estimated Creatinine Clearance: 67.2 mL/min (A) (by C-G formula based on SCr of 1.26 mg/dL (H)). Liver Function Tests: No results for input(s): AST, ALT, ALKPHOS, BILITOT, PROT, ALBUMIN  in the last 168 hours. No results for input(s): LIPASE, AMYLASE in  the last 168 hours. No results for input(s): AMMONIA in the last 168 hours. Coagulation Profile: No results for input(s): INR, PROTIME in the last 168 hours. Cardiac Enzymes: No results for input(s): CKTOTAL, CKMB, CKMBINDEX, TROPONINI in the last 168 hours. BNP (last 3 results) No results for input(s): PROBNP in the last 8760 hours. HbA1C: No results for input(s): HGBA1C in the last 72 hours. CBG: Recent Labs  Lab 05/01/23 1215 05/01/23 1629  05/01/23 2328 05/02/23 0803 05/02/23 1158  GLUCAP 195* 190* 208* 199* 208*   Lipid Profile: No results for input(s): CHOL, HDL, LDLCALC, TRIG, CHOLHDL, LDLDIRECT in the last 72 hours. Thyroid  Function Tests: No results for input(s): TSH, T4TOTAL, FREET4, T3FREE, THYROIDAB in the last 72 hours. Anemia Panel: No results for input(s): VITAMINB12, FOLATE, FERRITIN, TIBC, IRON, RETICCTPCT in the last 72 hours. Sepsis Labs: Recent Labs  Lab 04/27/23 1004  PROCALCITON <0.10    No results found for this or any previous visit (from the past 240 hours).       Radiology Studies: DG Lumbar Spine 2-3 Views Result Date: 05/01/2023 CLINICAL DATA:  65452 Low back pain [id number] EXAM: LUMBAR SPINE - 2-3 VIEW COMPARISON:  03/28/2023 CT abdomen/pelvis FINDINGS: This report assumes 5 non rib-bearing lumbar vertebrae. Very limited visualization of the lumbar spine on the lateral and coned lateral views including essentially non visualization of L4 and L5, presumably due to technique and/or overlying soft tissues. Lumbar vertebral body heights are grossly preserved, with no fracture. Marked multilevel lumbar degenerative disc disease. No appreciable spondylolisthesis. Mild bilateral lower lumbar facet arthropathy. No aggressive appearing focal osseous lesions. IMPRESSION: 1. Very limited lumbar spine radiographs with nearly nondiagnostic lateral views. 2. No gross lumbar spine fracture or spondylolisthesis. 3. Marked multilevel lumbar degenerative disc disease. Mild bilateral lower lumbar facet arthropathy. Electronically Signed   By: Selinda DELENA Blue M.D.   On: 05/01/2023 16:51        Scheduled Meds:  amiodarone   200 mg Oral BID   Followed by   NOREEN ON 05/07/2023] amiodarone   200 mg Oral Daily   apixaban   5 mg Per Tube BID   ascorbic acid   500 mg Per Tube Daily   Chlorhexidine  Gluconate Cloth  6 each Topical Daily   cholecalciferol   2,000 Units Per Tube Daily    clonazepam   0.25 mg Per Tube BID   cyanocobalamin   10,000 mcg Per Tube Daily   diphenoxylate -atropine   1 tablet Per Tube QID   ezetimibe   10 mg Per Tube Daily   feeding supplement (GLUCERNA 1.5 CAL)  237 mL Per Tube 6 X Daily   ferrous sulfate   300 mg Per Tube Daily   fiber supplement (BANATROL TF)  60 mL Per Tube BID   free water   30 mL Per Tube 6 X Daily   insulin  aspart  0-5 Units Subcutaneous QHS   insulin  aspart  0-9 Units Subcutaneous TID WC   insulin  glargine-yfgn  43 Units Subcutaneous QHS   levothyroxine   25 mcg Per Tube Q0600   lidocaine   1 patch Transdermal Q24H   metoprolol  tartrate  12.5 mg Oral BID   multivitamin with minerals  1 tablet Per Tube Daily   pramipexole   2 mg Per Tube QHS   primidone   250 mg Per Tube BID   QUEtiapine   25 mg Per Tube QHS   sodium zirconium cyclosilicate   5 g Per Tube Once   Continuous Infusions:     LOS: 35 days      Devaughn KATHEE Ban,  MD [facility]   If 7PM-7AM, please contact night-coverage  05/02/2023, 3:49 PM

## 2023-05-02 NOTE — Progress Notes (Signed)
 Physical Therapy Treatment Patient Details Name: [name]. MRN: [id number] DOB: 1955-05-19 Today's Date: 05/02/2023   History of Present Illness Pt is a 68 yo male pt admitted for hyperkalemia, AKI, UTI, and perinephric abscess. PMH includes Afib, asthma, CHF, COPD, CAD, depression, PVD, and esophageal cancer. Currently with PEG tube placed. Of note B LE BKA.    PT Comments  Pt was sitting at foot of bed holding onto footplate with RUE and L bed rail with LUE. RN in room at start of session. Pt is extremely disoriented and gets agitated easily. I know my wife is sleeping with that MD. She walked down the hall with him yesterday. Pt remains disoriented and has difficulty following command throughout. Overall PT session extremely limited by his cognition and inability to reorient to situation. All fall prevention strategies in place post session. RN staff aware. MD aware and meds being ordered. Acute PT will continue to follow and progress s able per current POC.    If plan is discharge home, recommend the following: Two people to help with walking and/or transfers;A lot of help with bathing/dressing/bathroom;Assistance with cooking/housework;Assistance with feeding;Direct supervision/assist for medications management;Direct supervision/assist for financial management;Assist for transportation;Help with stairs or ramp for entrance;Supervision due to cognitive status     Equipment Recommendations  None recommended by PT       Precautions / Restrictions Precautions Precautions: Fall Precaution Comments: BLE BKA, no prosthetics in room Restrictions Weight Bearing Restrictions Per Provider Order: No RLE Weight Bearing Per Provider Order: Weight bearing as tolerated (no prosthetic in room) LLE Weight Bearing Per Provider Order: Weight bearing as tolerated (no prosthetic in room)     Mobility  Bed Mobility  Supine to sit: Modified independent (Device/Increase time) Sit to supine:  Modified independent (Device/Increase time) General bed mobility comments: RN in room with pt sitting towards FOB with RUE on footboard and LUE holding L bedrail    Transfers  General transfer comment: Chartered Loss Adjuster elected not to transfer pt OOB due to agitation and poor safety awareness/impulsivity         Cognition Arousal: Alert Behavior During Therapy: Agitated, Impulsive, Restless Overall Cognitive Status: Impaired/Different from baseline Area of Impairment: Safety/judgement, Awareness, Problem solving, Following commands, Memory, Attention, Orientation    General Comments: Pt remains extremely confused and not at his baseline cognition of just 4 weeks prior. Pt is disoriented and agitated. difficult to redirect           General Comments General comments (skin integrity, edema, etc.): Most of limited session spent on reoriention and education on importance of performing ther x + fall prevention mesures      Pertinent Vitals/Pain Pain Assessment Pain Assessment: No/denies pain Pain Score: 0-No pain Pain Location: back pain Pain Intervention(s): Limited activity within patient's tolerance, Monitored during session, Premedicated before session, Repositioned     PT Goals (current goals can now be found in the care plan section) Acute Rehab PT Goals Patient Stated Goal: go home Progress towards PT goals: Not progressing toward goals - comment (cognition and impulsivity greatly limiting session/pt progression)    Frequency    Min 1X/week           Co-evaluation     PT goals addressed during session: Mobility/safety with mobility;Balance;Proper use of DME;Strengthening/ROM        AM-PAC PT 6 Clicks Mobility   Outcome Measure  Help needed turning from your back to your side while in a flat bed without using bedrails?: A Lot  Help needed moving from lying on your back to sitting on the side of a flat bed without using bedrails?: A Lot Help needed moving to and from  a bed to a chair (including a wheelchair)?: Total Help needed standing up from a chair using your arms (e.g., wheelchair or bedside chair)?: Total Help needed to walk in hospital room?: Total Help needed climbing 3-5 steps with a railing? : Total 6 Click Score: 8    End of Session   Activity Tolerance: Patient tolerated treatment well Patient left: in bed;with call bell/phone within reach;with bed alarm set Nurse Communication: Mobility status;Need for lift equipment PT Visit Diagnosis: Muscle weakness (generalized) (M62.81);Other abnormalities of gait and mobility (R26.89)     Time: 9249-9194 PT Time Calculation (min) (ACUTE ONLY): 15 min  Charges:    $Therapeutic Activity: 8-22 mins PT General Charges $$ ACUTE PT VISIT: 1 Visit                    Rankin Essex PTA 05/02/23, 5:14 PM

## 2023-05-02 NOTE — Inpatient Diabetes Management (Addendum)
 Inpatient Diabetes Program Recommendations  AACE/ADA: New Consensus Statement on Inpatient Glycemic Control (2015)  Target Ranges:  Prepandial:   less than 140 mg/dL      Peak postprandial:   less than 180 mg/dL (1-2 hours)      Critically ill patients:  140 - 180 mg/dL   Lab Results  Component Value Date   GLUCAP 208 (H) 05/02/2023   HGBA1C 7.8 (H) 03/15/2023    Review of Glycemic Control  Latest Reference Range & Units 04/30/23 21:25 05/01/23 08:02 05/01/23 12:15 05/01/23 16:29 05/01/23 23:28 05/02/23 08:03 05/02/23 11:58  Glucose-Capillary 70 - 99 mg/dL 817 (H) 798 (H) 804 (H) 190 (H) 208 (H) 199 (H) 208 (H)   Diabetes history: DM 2 Outpatient Diabetes medications: Lantus  50 units BID, Humalog 15 units TID with meals Current orders for Inpatient glycemic control: Semglee  43 units at bedtime, Novolog  0-9 units TID with meals, Novolog  0-5 units at bedtime Glucerna 237 ml per tube 6 times daily  Inpatient Diabetes Program Recommendations:    Per documentation on 05/01/23, Semglee  43 units was not given last night. Notified MD and will follow.    Thanks,  Randall Bullocks, RN, BC-ADM Inpatient Diabetes Coordinator Pager [phone]  (8a-5p)

## 2023-05-02 NOTE — Progress Notes (Signed)
 Mobility Specialist - Progress Note   05/02/23 1400  Mobility  Activity Transferred from bed to chair;Turned to right side;Turned to left side  Level of Assistance +2 (takes two people)  Assistive Device Sling  Activity Response Tolerated well  Mobility visit 1 Mobility     Pt lying in bed upon arrival, utilizing RA. Pt agreeable to activity. Voiced need to have BM; pt placed on bedpan and peri-care performed by NT. Pt rolled L/R with maxA for sling placement. Transferred bed-chair via hoyer. Pt left in chair with alarm set, needs in reach. NS aware.   Lennette Seip Mobility Specialist 05/02/23, 3:03 PM

## 2023-05-02 NOTE — Progress Notes (Signed)
 Patient seated upright in bed, a male visiting.  Male states, "who are you and why are you in here."  After introducing myself at patient's nurse, male states, "You can leave right now."  Charge nurse notified.

## 2023-05-02 NOTE — Progress Notes (Signed)
 Nutrition Follow-up  DOCUMENTATION CODES:   Obesity unspecified, Non-severe (moderate) malnutrition in context of chronic illness  INTERVENTION:   -TF via g-tube:    237 ml Glucerna 1.5 6 times daily  70 ml free water  flush before each feeding and 70 ml free water  flush after each after each feeding administration   Tube feeding regimen provides 2136 kcal (100% of needs), 118 grams of protein, and 1080 ml of H2O.  Total free water : 1920 ml daily   -Allow ice chips for dry mouth per discussion with MD -Continue 1 packet Banatrol BID  NUTRITION DIAGNOSIS:   Moderate Malnutrition related to chronic illness (esopahgeal mass, likely malginant) as evidenced by mild fat depletion, mild muscle depletion.  Ongoing  GOAL:   Patient will meet greater than or equal to 90% of their needs  Progressing   MONITOR:   TF tolerance  REASON FOR ASSESSMENT:   Consult Assessment of nutrition requirement/status  ASSESSMENT:   Pt with medical history significant of atrial fibrillation, asthma, congestive heart failure, COPD, coronary artery disease, depression, diabetes mellitus, gout, hyperlipidemia, hypertension, hypogonadism in male, morbid obesity, peripheral vascular disease, OSA on CPAP at night, esophageal cancer status post chemo and G-tube placement.  Reviewed I/O's: -42 ml x 24 hours and -10.9 L since 04/18/23  UOP: 1.1 L x 24 hours  Per nursing notes, pt experiencing intermittent delirium.   Pt remains NPO and received TF via PEG for sole source nutrition. Pt tolerating TF well.   Wt has been stable over the past week.   Per TOC notes, plan for appeal for SNF.   Palliative care following for goals of care.   Medications reviewed and include vitamin C , vitamin D3, klonopin , vitamin B-12, lomotil , zetia , and lopressor .   Labs reviewed: Na: 133, Mg: 2.5, CBGS: 190-208 (inpatient orders for glycemic control are 0-9 units insulin  aspart TID with meals and 43 units insulin   glargine-yfgn daily).    Diet Order:   Diet Order             Diet NPO time specified Except for: Ice Chips  Diet effective now                   EDUCATION NEEDS:   Education needs have been addressed  Skin:  Skin Assessment: Reviewed RN Assessment  Last BM:  05/01/23 (type 7)  Height:   Ht Readings from Last 1 Encounters:  04/02/23 5' 5 (1.651 m)    Weight:   Wt Readings from Last 1 Encounters:  05/02/23 119.6 kg    Ideal Body Weight:  75.2 kg (adjusted for bilateral BKAs)  BMI:  Body mass index is 43.88 kg/m.  Estimated Nutritional Needs:   Kcal:  2250-2450  Protein:  115-130 grams  Fluid:  > 2 L    Margery ORN, RD, LDN, CDCES Registered Dietitian III Certified Diabetes Care and Education Specialist If unable to reach this RD, please use RD Inpatient group chat on secure chat between hours of 8am-4 pm daily

## 2023-05-02 NOTE — Progress Notes (Signed)
 Mobility Specialist - Progress Note     05/02/23 1634  Mobility  Activity Transferred from chair to bed  Level of Assistance +2 (takes two people)  Assistive Device  Water Quality Scientist)  Activity Response Tolerated well  Mobility Referral Yes  Mobility visit 1 Mobility  Mobility Specialist Start Time (ACUTE ONLY) 1604 (Simultaneous filing. User may not have seen previous data.)  Mobility Specialist Stop Time (ACUTE ONLY) 1621  Mobility Specialist Time Calculation (min) (ACUTE ONLY) 17 min   Pt resting in recliner on RA upon entry. Pt transferred to bed from chair in hoyer lift. +2 assist. Weight recorded 259.4. Pt left in bed with needs in reach. Bed alarm activated.   Guido Rumble Mobility Specialist 05/02/23, 4:58 PM

## 2023-05-03 DIAGNOSIS — E875 Hyperkalemia: Secondary | ICD-10-CM | POA: Diagnosis not present

## 2023-05-03 LAB — GLUCOSE, CAPILLARY
Glucose-Capillary: 198 mg/dL — ABNORMAL HIGH (ref 70–99)
Glucose-Capillary: 234 mg/dL — ABNORMAL HIGH (ref 70–99)
Glucose-Capillary: 162 mg/dL — ABNORMAL HIGH (ref 70–99)

## 2023-05-03 LAB — BASIC METABOLIC PANEL
Anion gap: 11 (ref 5–15)
BUN: 53 mg/dL — ABNORMAL HIGH (ref 8–23)
CO2: 29 mmol/L (ref 22–32)
Calcium: 9.1 mg/dL (ref 8.9–10.3)
Chloride: 94 mmol/L — ABNORMAL LOW (ref 98–111)
Creatinine, Ser: 1.32 mg/dL — ABNORMAL HIGH (ref 0.61–1.24)
GFR, Estimated: 59 mL/min — ABNORMAL LOW (ref >60)
Glucose, Bld: 228 mg/dL — ABNORMAL HIGH (ref 70–99)
Potassium: 5.5 mmol/L — ABNORMAL HIGH (ref 3.5–5.1)
Sodium: 134 mmol/L — ABNORMAL LOW (ref 135–145)

## 2023-05-03 MED ORDER — LOPERAMIDE HCL 1 MG/7.5ML PO SUSP: 2.0000 mg | Freq: Four times a day (QID) | ORAL | Status: DC | PRN

## 2023-05-03 MED ORDER — AMIODARONE HCL 200 MG PO TABS: ORAL_TABLET | ORAL | Status: DC

## 2023-05-03 MED ORDER — DIPHENOXYLATE-ATROPINE 2.5-0.025 MG PO TABS: 1.0000 | ORAL_TABLET | Freq: Four times a day (QID) | ORAL | Status: DC

## 2023-05-03 MED ORDER — SODIUM ZIRCONIUM CYCLOSILICATE 10 G PO PACK
10.0000 g | PACK | Freq: Once | ORAL | Status: AC
  Administered 2023-05-03: 10 g
  Filled 2023-05-03: qty 1

## 2023-05-03 MED ORDER — RISPERIDONE 0.5 MG PO TABS: 0.5000 mg | ORAL_TABLET | Freq: Two times a day (BID) | ORAL | Status: DC | PRN

## 2023-05-03 MED ORDER — INSULIN ASPART 100 UNIT/ML IJ SOLN: 0.0000 [IU] | Freq: Three times a day (TID) | INTRAMUSCULAR | Status: DC

## 2023-05-03 MED ORDER — QUETIAPINE FUMARATE 25 MG PO TABS: 25.0000 mg | ORAL_TABLET | Freq: Every day | ORAL | Status: DC

## 2023-05-03 MED ORDER — BANATROL TF EN LIQD: 60.0000 mL | Freq: Two times a day (BID) | ENTERAL | Status: DC

## 2023-05-03 MED ORDER — METOPROLOL TARTRATE 25 MG PO TABS: 12.5000 mg | ORAL_TABLET | Freq: Two times a day (BID) | ORAL | Status: DC

## 2023-05-03 MED ORDER — GLUCERNA 1.5 CAL PO LIQD: 237.0000 mL | Freq: Every day | ORAL | Status: DC

## 2023-05-03 MED ORDER — LOKELMA 5 G PO PACK: 5.0000 g | PACK | Freq: Every day | ORAL | Status: DC

## 2023-05-03 MED ORDER — FREE WATER: 30.0000 mL | Freq: Every day | Status: DC

## 2023-05-03 MED ORDER — INSULIN GLARGINE-YFGN 100 UNIT/ML ~~LOC~~ SOLN: 40.0000 [IU] | Freq: Every day | SUBCUTANEOUS | Status: DC

## 2023-05-03 NOTE — Progress Notes (Signed)
 Mobility Specialist - Progress Note   05/03/23 1200  Mobility  Activity Transferred from bed to chair  Level of Assistance +2 (takes two people)  Assistive Device Sling  Activity Response Tolerated well  Mobility visit 1 Mobility     Pt sitting EOB on arrival, utilizing RA. Pt motivated for OOB activity. Transferred bed-chair via hoyer. Alarm set, needs in reach. No complaints.    Lennette Seip Mobility Specialist 05/03/23, 12:13 PM

## 2023-05-03 NOTE — TOC Transition Note (Signed)
 Transition of Care [facility]) - Discharge Note   Patient Details  Name: [name]. MRN: [id number] Date of Birth: 11-12-1955  [facility]) CM/SW Contact:  Elton Heid C Ariel Wingrove, RN Phone Number: 05/03/2023, 3:49 PM   Clinical Narrative:    Spoke with Tammy in admissions at [facility].   Per facility patient admission confirmed for today. Patient assigned room # 701 Nurse will call report to [phone]  Face sheet and medical necessity forms printed to the floor to be added to the EMS pack EMS arranged  Discharge summary and SNF transfer report sent in HUB.  Nurse, and family notified spoke with his spouse TOC signing off.          Patient Goals and CMS Choice            Discharge Placement                       Discharge Plan and Services Additional resources added to the After Visit Summary for                                       Social Drivers of Health (SDOH) Interventions SDOH Screenings   Food Insecurity: No Food Insecurity (03/29/2023)  Housing: Low Risk  (03/29/2023)  Transportation Needs: No Transportation Needs (03/29/2023)  Utilities: Not At Risk (03/29/2023)  Depression (PHQ2-9): Low Risk  (11/21/2021)  Financial Resource Strain: Low Risk  (09/28/2022)   Received from [facility], [facility]  Social Connections: Socially Isolated (04/30/2023)  Tobacco Use: Medium Risk (04/10/2023)     Readmission Risk Interventions    02/13/2022    4:57 PM 09/29/2021   11:12 AM  Readmission Risk Prevention Plan  Transportation Screening Complete Complete  PCP or Specialist Appt within 3-5 Days  Complete  HRI or Home Care Consult  Complete  Social Work Consult for Recovery Care Planning/Counseling  Complete  Palliative Care Screening  Not Applicable  Medication Review Oceanographer) Complete Complete  PCP or Specialist appointment within 3-5 days of discharge Complete   HRI or Home  Care Consult Complete   SW Recovery Care/Counseling Consult Complete   Palliative Care Screening Not Applicable   Skilled Nursing Facility Not Applicable

## 2023-05-03 NOTE — Progress Notes (Signed)
 Brief Nutrition Follow-Up Note  Case discussed with RN, MD, and TOC; plan for pt to discharge to [facility].   Pt sitting up in chair at time of visit. Pt pleasant but confused, stating his wife is an employee of the hospital. RD attempted to redirect and reorient pt to place and situation. Pt reports that he is excited to leave the hospital today.   TOC requesting to provide TF and syringes to last pt until Monday, 05/05/22. RD delivered box of TF to pt room and informed staff that it had been delivered.   INTERVENTION:    -TF via g-tube:    237 ml Glucerna 1.5 6 times daily  70 ml free water  flush before each feeding and 70 ml free water  flush after each after each feeding administration   Tube feeding regimen provides 2136 kcal (100% of needs), 118 grams of protein, and 1080 ml of H2O.  Total free water : 1920 ml daily   -Allow ice chips for dry mouth per discussion with MD  Margery ORN, RD, LDN, CDCES Registered Dietitian III Certified Diabetes Care and Education Specialist If unable to reach this RD, please use RD Inpatient group chat on secure chat between hours of 8am-4 pm daily

## 2023-05-03 NOTE — Plan of Care (Signed)
  Problem: Education: Goal: Ability to describe self-care measures that may prevent or decrease complications (Diabetes Survival Skills Education) will improve Outcome: Progressing   Problem: Coping: Goal: Ability to adjust to condition or change in health will improve Outcome: Progressing   Problem: Fluid Volume: Goal: Ability to maintain a balanced intake and output will improve Outcome: Progressing   Problem: Health Behavior/Discharge Planning: Goal: Ability to identify and utilize available resources and services will improve Outcome: Progressing   Problem: Metabolic: Goal: Ability to maintain appropriate glucose levels will improve Outcome: Progressing   Problem: Nutritional: Goal: Maintenance of adequate nutrition will improve Outcome: Progressing   Problem: Skin Integrity: Goal: Risk for impaired skin integrity will decrease Outcome: Progressing   Problem: Nutrition: Goal: Adequate nutrition will be maintained Outcome: Progressing   Problem: Coping: Goal: Level of anxiety will decrease Outcome: Progressing   Problem: Pain Management: Goal: General experience of comfort will improve Outcome: Progressing   Problem: Safety: Goal: Ability to remain free from injury will improve Outcome: Progressing   Problem: Skin Integrity: Goal: Risk for impaired skin integrity will decrease Outcome: Progressing

## 2023-05-03 NOTE — TOC Progression Note (Signed)
 Transition of Care (TOC) - Progression Note    Patient Details  Name: [name]. MRN: [id number] Date of Birth: [birth year]  [facility] CM/SW Contact  Cova Knieriem A Burrel Legrand, RN Phone Number: 05/03/2023, 9:53 AM  Clinical Narrative:    Chart reviewed.  I have spoken with [employer].  The plan has informed me that an appeal was filed on yesterday for Mr. Ostlund.  The representative informed me that the plan has 72 hours to make a determination.  I have made Dr. Rozella aware of the above information.    TOC will continue to follow for discharge planning.          Expected Discharge Plan and Services                                               Social Determinants of Health (SDOH) Interventions SDOH Screenings   Food Insecurity: No Food Insecurity (03/29/2023)  Housing: Low Risk  (03/29/2023)  Transportation Needs: No Transportation Needs (03/29/2023)  Utilities: Not At Risk (03/29/2023)  Depression (PHQ2-9): Low Risk  (11/21/2021)  Financial Resource Strain: Low Risk  (09/28/2022)   Received [facility], [facility]  Social Connections: Socially Isolated (04/30/2023)  Tobacco Use: Medium Risk (04/10/2023)    Readmission Risk Interventions    02/13/2022    4:57 PM 09/29/2021   11:12 AM  Readmission Risk Prevention Plan  Transportation Screening Complete Complete  PCP or Specialist Appt within 3-5 Days  Complete  HRI or Home Care Consult  Complete  Social Work Consult for Recovery Care Planning/Counseling  Complete  Palliative Care Screening  Not Applicable  Medication Review Oceanographer) Complete Complete  PCP or Specialist appointment within 3-5 days of discharge Complete   HRI or Home Care Consult Complete   SW Recovery Care/Counseling Consult Complete   Palliative Care Screening Not Applicable   Skilled Nursing Facility Not Applicable

## 2023-05-03 NOTE — Discharge Summary (Signed)
 Alec A [employer] FMW:[id number] DOB: 01-18-1956 DOA: 03/28/2023  PCP: Alec Reyes BIRCH, MD  Admit date: 03/28/2023 Discharge date: 05/03/2023  Time spent: 35 minutes  Recommendations for Outpatient Follow-up:  Cardiology f/u (need to schedule) Oncology f/u (need to schedule) Outpatient urology f/u (need to schedule) Check bmp in 1 week to assess potassium and kidney function    Discharge Diagnoses:  Principal Problem:   Hyperkalemia Active Problems:   Delirium   Acute kidney injury (HCC)   Malnutrition of moderate degree   Acute hyperkalemia   Diarrhea   Perinephric fluid collection   Persistent atrial fibrillation (HCC)   Typical atrial flutter (HCC)   Diarrhea of infectious origin   Atrial flutter with rapid ventricular response (HCC)   Malignant neoplasm of lower third of esophagus (HCC)   Palliative care encounter   SOB (shortness of breath)   Discharge Condition: stable  Diet recommendation: tube feeds  Filed Weights   04/30/23 0340 05/01/23 0359 05/02/23 0417  Weight: 119 kg 119.1 kg 119.6 kg    History of present illness:  From admission h and p: Alec A Jaasiel [place]. is a 68 y.o. male with medical history significant of atrial fibrillation, asthma, congestive heart failure, COPD, coronary artery disease, depression, diabetes mellitus, gout, hyperlipidemia, hypertension, hypogonadism in male, morbid obesity, peripheral vascular disease, OSA on CPAP at night, esophageal cancer status post chemo and G-tube placement.  According to patient family brought him in as he has been having generalized weakness with diarrhea.  He denies abdominal pain, chest pain, nausea vomiting, cough or urinary complaints.   Hospital Course:  Acute on chronic persistent atrial flutter with rvr Intermittent hypotension. Patient underwent successful cardioversion 04/23/2023 Continue metoprolol  Diltiazem  was discontinued on 12/22 due to hypotension Remains in NSR Continue  apixaban    Acute kidney injury secondary to dehydration Hyperkalemia. Hyponatremia. This is secondary to dehydration from diarrhea.  Renal function had improved after fluids.  Hyperkalemia  is intermittent, will d/c on lokelma , will need close monitoring of kidney function and electrolytes   Delirium secondary to acute metabolic encephalopathy.-Improving Klebsiella UTI Patient has had recurrent UTI during and before hospitalization.  Unclear whether the latest urinalysis drawn on 12/30 represents true infection but it is leukocyte and nitrate positive. Plan: Hold,abx and monitor for clear signs/symptoms of infection   Acute diarrhea secondary to C. difficile colitis. Patient is status post 10 days of Dificid  followed with 10-day course of vancomycin .   Status post flexible sigmoidoscopy on 12/11.  Normal-appearing mucosa.  Biopsies taken.  Diarrhea has been improving as of 12/12 Plan: C. difficile infection has essentially resolved.  Patient has been hospitalized over 30 days so isolation precautions have been removed.  Persistent diarrhea likely secondary to tube feeds.  Lomotil  and Imodium  initiated.   Possible right perinephric hematoma --Described as possible abscess by radiology but per secure chat communication w/ Dr. Penne on 12/9, this is a chornic complex hematoma dating back over a year, nothing that has been drainable thus far, possible underlying renal mass, and in light of significant comorbidities and as he is asymptomatic inpatient evaluation has been deferred Continue to follow-up with urologist as an outpatient   Urinary retention secondary to benign prostate hypertrophy. Patient developed significant urinary retention since last night with residual over 500 mL, patient had In-N-Out cath multiple times, Foley catheter was anchored again.  Monitor UOP   Chronic diastolic congestive heart failure --Recent EF of 55% on echo obtained on November 2024 No exacerbation.  COPD Quiescent Continue as needed nebulization    Coronary artery disease,  Con apixaban  and zetia    Diabetes mellitus type 2 with hyperglycemia Did not get insulin  last night.  - continue basal insulin  with ssi   Hypothyroidism Continue levothyroxine    Moderate protein calorie malnutrition. Nutrition Problem: Moderate Malnutrition Etiology: chronic illness (esopahgeal mass, likely malginant) Signs/Symptoms: mild fat depletion, mild muscle depletion Interventions: Tube feeding   Peripheral vascular disease S/p bilateral BKAs --Continue aspirin   --Patient is allergic to statin therapy   OSA on CPAP at night,  Continue CPAP   Esophageal cancer status post chemo and G-tube placement. Continue bolus tube feeding.   Low back pain Midline tenderness says aggravated by being up in bed, no neurologic symptoms, nothing acute seen on plain film requested by wife    Procedures: Flex sig, cardioversion   Consultations: GI, cardiology  Discharge Exam: Vitals:   05/03/23 0405 05/03/23 0748  BP: 111/66 121/66  Pulse: 77 77  Resp:  20  Temp: 98.5 F (36.9 C) 97.8 F (36.6 C)  SpO2: 97% 96%    General exam: No acute distress.  Appears chronically ill Respiratory system: Bibasilar crackles.  Normal work of breathing.   Cardiovascular system: S1-S2, RRR, no murmurs, no pedal edema Gastrointestinal system: Soft, NT/ND, + PEG tube, mild eryhema around it Central nervous system: Alert.  Oriented x 2.  No focal deficits Extremities: Bilateral lower extremity amputations Skin: No rashes, lesions or ulcers Back: midline tenderness lumbar spine Psychiatry: Judgement and insight appear normal. Mood & affect appropriate.   Discharge Instructions   Discharge Instructions     Discharge wound care:   Complete by: As directed    Standard pressure ulcer wound care   Increase activity slowly   Complete by: As directed       Allergies as of 05/03/2023       Reactions    Penicillins Other (See Comments)   Happened at 68 years old and pt. stated he passed out  He has tolerated amoxicillin /clavulanate and ampicillin /sulbactam   Statins    Other reaction(s): Muscle Pain Causes legs to ache per pt   Metformin  And Related Diarrhea        Medication List     STOP taking these medications    aspirin  81 MG chewable tablet Commonly known as: Aspirin  Childrens   diltiazem  30 MG tablet Commonly known as: CARDIZEM    FreeStyle Libre 3 Sensor Misc   furosemide  20 MG tablet Commonly known as: LASIX    gabapentin  300 MG capsule Commonly known as: NEURONTIN    HumaLOG 100 UNIT/ML injection Generic drug: insulin  lispro   insulin  glargine 100 UNIT/ML injection Commonly known as: LANTUS    isosorbide  mononitrate 60 MG 24 hr tablet Commonly known as: IMDUR    lidocaine -prilocaine  cream Commonly known as: EMLA    losartan  50 MG tablet Commonly known as: COZAAR    mupirocin  ointment 2 % Commonly known as: BACTROBAN    nitrofurantoin  (macrocrystal-monohydrate) 100 MG capsule Commonly known as: MACROBID    zinc  sulfate (50mg  elemental zinc ) 220 (50 Zn) MG capsule       TAKE these medications    acetaminophen  325 MG tablet Commonly known as: TYLENOL  Place 2 tablets (650 mg total) into feeding tube every 6 (six) hours as needed for mild pain (pain score 1-3) (or Fever >/= 101).   amiodarone  200 MG tablet Commonly known as: PACERONE  Take twice a day through 1/7 and then once daily What changed:  how much to take how to take this  when to take this additional instructions   apixaban  5 MG Tabs tablet Commonly known as: Eliquis  Place 1 tablet (5 mg total) into feeding tube 2 (two) times daily.   ascorbic acid  500 MG tablet Commonly known as: VITAMIN C  Place 1 tablet (500 mg total) into feeding tube daily.   Cholecalciferol  25 MCG (1000 UT) tablet Place 1 tablet (1,000 Units total) into feeding tube daily. What changed: how much to take    clonazePAM  0.5 MG tablet Commonly known as: KLONOPIN  Place 1 tablet (0.5 mg total) into feeding tube 2 (two) times daily.   Dermacloud Oint Apply 1 Application topically daily as needed.   diphenoxylate -atropine  2.5-0.025 MG tablet Commonly known as: LOMOTIL  Place 1 tablet into feeding tube 4 (four) times daily.   ezetimibe  10 MG tablet Commonly known as: ZETIA  Place 1 tablet (10 mg total) into feeding tube at bedtime.   feeding supplement (GLUCERNA 1.5 CAL) Liqd Place 237 mLs into feeding tube 6 (six) times daily. What changed:  how much to take when to take this Another medication with the same name was removed. Continue taking this medication, and follow the directions you see here.   ferrous sulfate  300 (60 Fe) MG/5ML syrup Place 5 mLs (300 mg total) into feeding tube daily. What changed:  how much to take Another medication with the same name was removed. Continue taking this medication, and follow the directions you see here.   fiber supplement (BANATROL TF) liquid Place 60 mLs into feeding tube 2 (two) times daily.   finasteride  5 MG tablet Commonly known as: PROSCAR  Take 1 tablet by mouth daily.   free water  Soln Place 30 mLs into feeding tube 6 (six) times daily.   insulin  aspart 100 UNIT/ML injection Commonly known as: novoLOG  Inject 0-9 Units into the skin 3 (three) times daily with meals.   insulin  glargine-yfgn 100 UNIT/ML injection Commonly known as: SEMGLEE  Inject 0.4 mLs (40 Units total) into the skin at bedtime.   levothyroxine  25 MCG tablet Commonly known as: SYNTHROID  Place 1 tablet (25 mcg total) into feeding tube daily at 6 (six) AM.   Lokelma  5 g packet Generic drug: sodium zirconium cyclosilicate  Take 5 g by mouth daily.   loperamide  HCl 1 MG/7.5ML suspension Commonly known as: IMODIUM  Place 15 mLs (2 mg total) into feeding tube every 6 (six) hours as needed for diarrhea or loose stools.   metoprolol  tartrate 25 MG tablet Commonly  known as: LOPRESSOR  Take 0.5 tablets (12.5 mg total) by mouth 2 (two) times daily.   multivitamin with minerals tablet Take 1 tablet by mouth daily.   nitroGLYCERIN  0.4 MG SL tablet Commonly known as: NITROSTAT  Place 1 tablet (0.4 mg total) under the tongue every 5 (five) minutes as needed for chest pain.   nystatin  powder Commonly known as: MYCOSTATIN /NYSTOP  Apply 1 Application topically 2 (two) times daily. Under belly   ondansetron  4 MG tablet Commonly known as: ZOFRAN  Place 1 tablet (4 mg total) into feeding tube every 6 (six) hours as needed for nausea.   pramipexole  1 MG tablet Commonly known as: MIRAPEX  Place 2 tablets (2 mg total) into feeding tube at bedtime.   primidone  250 MG tablet Commonly known as: MYSOLINE  Place 1 tablet (250 mg total) into feeding tube 2 (two) times daily.   QUEtiapine  25 MG tablet Commonly known as: SEROQUEL  Place 1 tablet (25 mg total) into feeding tube at bedtime.   risperiDONE  0.5 MG tablet Commonly known as: RISPERDAL  Place 1 tablet (0.5 mg  total) into feeding tube 2 (two) times daily as needed (agitation).   tamsulosin  0.4 MG Caps capsule Commonly known as: FLOMAX  Take 0.4 mg by mouth.   vitamin A & D ointment Apply 1 Application topically as needed for dry skin. Apply to stumps   Vitamin B-12 5000 MCG Tbdp Place 10,000 mcg into feeding tube daily.               Discharge Care Instructions  (From admission, onward)           Start     Ordered   05/03/23 0000  Discharge wound care:       Comments: Standard pressure ulcer wound care   05/03/23 1414           Allergies  Allergen Reactions   Penicillins Other (See Comments)    Happened at 68 years old and pt. stated he passed out  He has tolerated amoxicillin /clavulanate and ampicillin /sulbactam   Statins     Other reaction(s): Muscle Pain Causes legs to ache per pt   Metformin  And Related Diarrhea    Follow-up Information     Darron Deatrice LABOR, MD  Follow up.   Specialty: Cardiology Contact information: [address] [place] [address] [phone]                  The results of significant diagnostics from this hospitalization (including imaging, microbiology, ancillary and laboratory) are listed below for reference.    Significant Diagnostic Studies: DG Lumbar Spine 2-3 Views Result Date: 05/01/2023 CLINICAL DATA:  65452 Low back pain 34547 EXAM: LUMBAR SPINE - 2-3 VIEW COMPARISON:  03/28/2023 CT abdomen/pelvis FINDINGS: This report assumes 5 non rib-bearing lumbar vertebrae. Very limited visualization of the lumbar spine on the lateral and coned lateral views including essentially non visualization of L4 and L5, presumably due to technique and/or overlying soft tissues. Lumbar vertebral body heights are grossly preserved, with no fracture. Marked multilevel lumbar degenerative disc disease. No appreciable spondylolisthesis. Mild bilateral lower lumbar facet arthropathy. No aggressive appearing focal osseous lesions. IMPRESSION: 1. Very limited lumbar spine radiographs with nearly nondiagnostic lateral views. 2. No gross lumbar spine fracture or spondylolisthesis. 3. Marked multilevel lumbar degenerative disc disease. Mild bilateral lower lumbar facet arthropathy. Electronically Signed   By: Selinda LABOR Blue M.D.   On: 05/01/2023 16:51   MR BRAIN WO CONTRAST Result Date: 04/15/2023 CLINICAL DATA:  Mental status change, unknown cause EXAM: MRI HEAD WITHOUT CONTRAST TECHNIQUE: Multiplanar, multiecho pulse sequences of the brain and surrounding structures were obtained without intravenous contrast. COMPARISON:  Brain MRI 01/18/2023 FINDINGS: Brain: Negative for an acute infarct. No hemorrhage. No hydrocephalus. No extra-axial fluid collection. No mass effect. No mass lesion. There is a background of mild chronic microvascular ischemic change. Redemonstrated are scattered microhemorrhages in the supratentorial brain,  unchanged from prior exam. Vascular: Normal flow voids. Skull and upper cervical spine: Normal marrow signal. Sinuses/Orbits: No middle ear or mastoid effusion. Paranasal sinuses are clear. Orbits are unremarkable. Other: None. IMPRESSION: No acute intracranial process. Electronically Signed   By: Lyndall Gore M.D.   On: 04/15/2023 16:12    Microbiology: No results found for this or any previous visit (from the past 240 hours).   Labs: Basic Metabolic Panel: Recent Labs  Lab 04/29/23 0322 04/30/23 0621 05/01/23 0523 05/02/23 0529 05/03/23 0633  NA 132* 132* 132* 133* 134*  K 5.6* 4.9 5.3* 5.0 5.5*  CL 94* 95* 96* 95* 94*  CO2 28 26  26 26 29   GLUCOSE 170* 195* 185* 192* 228*  BUN 38* 44* 47* 50* 53*  CREATININE 1.21 1.24 1.36* 1.26* 1.32*  CALCIUM  8.9 8.9 8.9 9.0 9.1  MG 2.7* 2.4 2.5*  --   --   PHOS  --  4.7* 4.2  --   --    Liver Function Tests: No results for input(s): AST, ALT, ALKPHOS, BILITOT, PROT, ALBUMIN  in the last 168 hours. No results for input(s): LIPASE, AMYLASE in the last 168 hours. No results for input(s): AMMONIA in the last 168 hours. CBC: Recent Labs  Lab 04/27/23 1004 05/02/23 0529  WBC 11.8* 9.3  NEUTROABS 9.1*  --   HGB 11.4* 10.6*  HCT 35.0* 33.0*  MCV 91.9 91.9  PLT 280 240   Cardiac Enzymes: No results for input(s): CKTOTAL, CKMB, CKMBINDEX, TROPONINI in the last 168 hours. BNP: BNP (last 3 results) No results for input(s): BNP in the last 8760 hours.  ProBNP (last 3 results) No results for input(s): PROBNP in the last 8760 hours.  CBG: Recent Labs  Lab 05/02/23 1158 05/02/23 1711 05/02/23 2114 05/03/23 0747 05/03/23 1157  GLUCAP 208* 211* 210* 198* 234*       Signed:  Devaughn KATHEE Ban MD.  [facility] 05/03/2023, 2:18 PM

## 2023-05-03 NOTE — TOC Progression Note (Signed)
 Transition of Care (TOC) - Progression Note    Patient Details  Name: [name]. MRN: [id number] Date of Birth: Nov 09, 1955  Transition of Care [facility]) CM/SW Contact  Sun Wilensky A Renada Cronin, RN Phone Number: 05/03/2023, 1:02 PM  Clinical Narrative:    Received call from [facility].  [employer] informs me that patient has been approved for SNF.  [employer] informs me that the plan had made an error for the previous denial.  The plan reports that patient did not use all of his skilled days.   [employer] informs me that [facility] ID is [id number].  Patient is approved from 05-02-22- 05-06-2022.  Next review date is 05-06-2022.  I have informed Tillman with [facility] of the above information.  Tillman informs me that she has to speak with the insurance company to find out if patient's plan changed and will follow back up with TOC once she is able to verify insurance information.  Could be a possible that once she has confirmed with insurance company patient's benefits that she can accept patient today.    I have made Provider aware.           Expected Discharge Plan and Services                                               Social Determinants of Health (SDOH) Interventions SDOH Screenings   Food Insecurity: No Food Insecurity (03/29/2023)  Housing: Low Risk  (03/29/2023)  Transportation Needs: No Transportation Needs (03/29/2023)  Utilities: Not At Risk (03/29/2023)  Depression (PHQ2-9): Low Risk  (11/21/2021)  Financial Resource Strain: Low Risk  (09/28/2022)   Received from [facility]  Social Connections: Socially Isolated (04/30/2023)  Tobacco Use: Medium Risk (04/10/2023)    Readmission Risk Interventions    02/13/2022    4:57 PM 09/29/2021   11:12 AM  Readmission Risk Prevention Plan  Transportation Screening Complete Complete  PCP or Specialist Appt within 3-5 Days  Complete  HRI or Home Care Consult  Complete  Social Work  Consult for Recovery Care Planning/Counseling  Complete  Palliative Care Screening  Not Applicable  Medication Review Oceanographer) Complete Complete  PCP or Specialist appointment within 3-5 days of discharge Complete   HRI or Home Care Consult Complete   SW Recovery Care/Counseling Consult Complete   Palliative Care Screening Not Applicable   Skilled Nursing Facility Not Applicable

## 2023-05-06 ENCOUNTER — Other Ambulatory Visit: Payer: Self-pay | Admitting: Hospice and Palliative Medicine

## 2023-05-06 DIAGNOSIS — C159 Malignant neoplasm of esophagus, unspecified: Secondary | ICD-10-CM

## 2023-05-06 NOTE — Progress Notes (Signed)
 Referral palliative care at [facility]

## 2023-05-07 ENCOUNTER — Other Ambulatory Visit: Payer: Self-pay | Admitting: Radiology

## 2023-05-08 ENCOUNTER — Telehealth: Payer: Self-pay | Admitting: *Deleted

## 2023-05-08 NOTE — Telephone Encounter (Signed)
 Patient's wife reports that he is now admitted to [facility]. He is going to get PET as scheduled. Future appointments will need to be scheduled with [facility]. Contact Konrad Dolores [phone].

## 2023-05-09 ENCOUNTER — Inpatient Hospital Stay: Admission: RE | Admit: 2023-05-09 | Payer: Medicare Other | Source: Ambulatory Visit

## 2023-05-09 ENCOUNTER — Ambulatory Visit: Payer: Medicare Other

## 2023-05-09 ENCOUNTER — Other Ambulatory Visit: Payer: Medicare Other

## 2023-05-14 ENCOUNTER — Ambulatory Visit
Admission: RE | Admit: 2023-05-14 | Discharge: 2023-05-14 | Disposition: A | Discharge status: Home / Self Care | Payer: Medicare Other | Source: Ambulatory Visit | Attending: Internal Medicine | Admitting: Internal Medicine

## 2023-05-14 ENCOUNTER — Ambulatory Visit: Payer: Medicare Other | Admitting: Medical

## 2023-05-14 DIAGNOSIS — C159 Malignant neoplasm of esophagus, unspecified: Secondary | ICD-10-CM | POA: Diagnosis present

## 2023-05-14 LAB — GLUCOSE, CAPILLARY: Glucose-Capillary: 176 mg/dL — ABNORMAL HIGH (ref 70–99)

## 2023-05-14 MED ORDER — FLUDEOXYGLUCOSE F - 18 (FDG) INJECTION
10.7600 | Freq: Once | INTRAVENOUS | Status: AC | PRN
  Administered 2023-05-14: 10.76 via INTRAVENOUS

## 2023-05-17 ENCOUNTER — Encounter: Payer: Self-pay | Admitting: Internal Medicine

## 2023-05-21 ENCOUNTER — Ambulatory Visit
Admission: RE | Admit: 2023-05-21 | Discharge: 2023-05-21 | Disposition: A | Discharge status: Home / Self Care | Payer: Medicare Other | Source: Ambulatory Visit | Attending: Radiation Oncology | Admitting: Radiation Oncology

## 2023-05-21 ENCOUNTER — Ambulatory Visit: Payer: Medicare Other | Admitting: Student

## 2023-05-21 DIAGNOSIS — C155 Malignant neoplasm of lower third of esophagus: Secondary | ICD-10-CM | POA: Insufficient documentation

## 2023-05-21 DIAGNOSIS — C801 Malignant (primary) neoplasm, unspecified: Secondary | ICD-10-CM

## 2023-05-21 DIAGNOSIS — Z510 Encounter for antineoplastic radiation therapy: Secondary | ICD-10-CM | POA: Insufficient documentation

## 2023-05-22 ENCOUNTER — Other Ambulatory Visit: Payer: Self-pay

## 2023-05-22 ENCOUNTER — Emergency Department: Payer: Medicare Other

## 2023-05-22 ENCOUNTER — Emergency Department
Admission: EM | Admit: 2023-05-22 | Discharge: 2023-05-23 | Disposition: A | Discharge status: Skilled Nursing Facility | Payer: Medicare Other | Attending: Emergency Medicine | Admitting: Emergency Medicine

## 2023-05-22 ENCOUNTER — Encounter: Payer: Self-pay | Admitting: Emergency Medicine

## 2023-05-22 DIAGNOSIS — J449 Chronic obstructive pulmonary disease, unspecified: Secondary | ICD-10-CM | POA: Insufficient documentation

## 2023-05-22 DIAGNOSIS — Z7901 Long term (current) use of anticoagulants: Secondary | ICD-10-CM | POA: Diagnosis not present

## 2023-05-22 DIAGNOSIS — E119 Type 2 diabetes mellitus without complications: Secondary | ICD-10-CM | POA: Diagnosis not present

## 2023-05-22 DIAGNOSIS — Z794 Long term (current) use of insulin: Secondary | ICD-10-CM | POA: Diagnosis not present

## 2023-05-22 DIAGNOSIS — I110 Hypertensive heart disease with heart failure: Secondary | ICD-10-CM | POA: Insufficient documentation

## 2023-05-22 DIAGNOSIS — I509 Heart failure, unspecified: Secondary | ICD-10-CM | POA: Insufficient documentation

## 2023-05-22 DIAGNOSIS — R079 Chest pain, unspecified: Secondary | ICD-10-CM | POA: Insufficient documentation

## 2023-05-22 DIAGNOSIS — J45909 Unspecified asthma, uncomplicated: Secondary | ICD-10-CM | POA: Insufficient documentation

## 2023-05-22 DIAGNOSIS — I4891 Unspecified atrial fibrillation: Secondary | ICD-10-CM | POA: Insufficient documentation

## 2023-05-22 DIAGNOSIS — I2510 Atherosclerotic heart disease of native coronary artery without angina pectoris: Secondary | ICD-10-CM | POA: Diagnosis not present

## 2023-05-22 LAB — BASIC METABOLIC PANEL
Anion gap: 12 (ref 5–15)
BUN: 30 mg/dL — ABNORMAL HIGH (ref 8–23)
CO2: 27 mmol/L (ref 22–32)
Calcium: 8.6 mg/dL — ABNORMAL LOW (ref 8.9–10.3)
Chloride: 98 mmol/L (ref 98–111)
Creatinine, Ser: 0.97 mg/dL (ref 0.61–1.24)
GFR, Estimated: >60 mL/min (ref >60)
Glucose, Bld: 189 mg/dL — ABNORMAL HIGH (ref 70–99)
Potassium: 4.7 mmol/L (ref 3.5–5.1)
Sodium: 137 mmol/L (ref 135–145)

## 2023-05-22 LAB — CBC
HCT: 32.7 % — ABNORMAL LOW (ref 39.0–52.0)
Hemoglobin: 10.1 g/dL — ABNORMAL LOW (ref 13.0–17.0)
MCH: 29.8 pg (ref 26.0–34.0)
MCHC: 30.9 g/dL (ref 30.0–36.0)
MCV: 96.5 fL (ref 80.0–100.0)
Platelets: 172 10*3/uL (ref 150–400)
RBC: 3.39 MIL/uL — ABNORMAL LOW (ref 4.22–5.81)
RDW: 16.8 % — ABNORMAL HIGH (ref 11.5–15.5)
WBC: 10.5 10*3/uL (ref 4.0–10.5)
nRBC: 0 % (ref 0.0–0.2)

## 2023-05-22 LAB — TROPONIN I (HIGH SENSITIVITY): Troponin I (High Sensitivity): 13 ng/L (ref <18)

## 2023-05-22 NOTE — ED Triage Notes (Signed)
Patient to ED via [facility] from [facility] for centralized CP. Pt states no pain at this time just PTA. Hx of STEMI. 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 Given 324 ASA by EMS.

## 2023-05-23 LAB — TROPONIN I (HIGH SENSITIVITY): Troponin I (High Sensitivity): 13 ng/L (ref <18)

## 2023-05-23 NOTE — Discharge Instructions (Addendum)
Follow up with your cardiologist as scheduled.

## 2023-05-23 NOTE — ED Provider Notes (Signed)
[facility] Provider Note    Event Date/Time   First MD Initiated Contact with Patient 05/22/23 2303     (approximate)   History   Chest Pain   HPI  [name]. is a 68 y.o. male with history of CAD, CHF, COPD, hypertension, hyperlipidemia, diabetes, atrial fibrillation on Eliquis, peripheral vascular disease status post bilateral BKA's, esophageal adenocarcinoma who presents to the emergency department from [facility] with chest pain.  Describes it as a pressure.  Took 1 nitroglycerin with EMS and pain is gone.  Wife reports that [facility] stated he they could not give him a nitroglycerin there for his chest pain due to his blood pressure being too low.  He is now asymptomatic.  No shortness of breath, nausea or vomiting, diaphoresis or dizziness, fever or cough.  No history of PE or DVT.  Has cardiology follow-up scheduled on February 3.   History provided by patient, wife.    Past Medical History:  Diagnosis Date   Arrhythmia    atrial fibrillation   Asthma    CHF (congestive heart failure) (HCC)    COPD (chronic obstructive pulmonary disease) (HCC)    Coronary artery disease    Depression    Diabetes mellitus without complication (HCC)    Gout    History anabolic steroid use    Hyperlipidemia    Hypertension    Hypogonadism in male    MI (myocardial infarction) (HCC)    Morbid obesity (HCC)    Myocardial infarction (HCC)    Peripheral vascular disease (HCC)    Perirectal abscess    Pleurisy    Sleep apnea    CPAP at night, no oxygen   Varicella     Past Surgical History:  Procedure Laterality Date   ABDOMINAL AORTIC ANEURYSM REPAIR     ACHILLES TENDON SURGERY Left 01/10/2021   Procedure: ACHILLES LENGTHENING/KIDNER;  Surgeon: Rosetta Posner, DPM;  Location: [facility];  Service: Podiatry;  Laterality: Left;   AMPUTATION Left 10/03/2021   Procedure: AMPUTATION BELOW KNEE;  Surgeon: Renford Dills, MD;  Location:  [facility];  Service: Vascular;  Laterality: Left;   AMPUTATION Right 05/24/2022   Procedure: AMPUTATION BELOW KNEE;  Surgeon: Annice Needy, MD;  Location: [facility];  Service: General;  Laterality: Right;   AMPUTATION TOE Right 02/10/2016   Procedure: AMPUTATION TOE 3RD TOE;  Surgeon: Gwyneth Revels, DPM;  Location: [facility];  Service: Podiatry;  Laterality: Right;   AMPUTATION TOE Left 02/24/2020   Procedure: AMPUTATION TOE;  Surgeon: Rosetta Posner, DPM;  Location: [facility];  Service: Podiatry;  Laterality: Left;   APPLICATION OF WOUND VAC Left 02/29/2020   Procedure: APPLICATION OF WOUND VAC;  Surgeon: Rosetta Posner, DPM;  Location: [facility];  Service: Podiatry;  Laterality: Left;   BIOPSY  03/15/2023   Procedure: BIOPSY;  Surgeon: Wyline Mood, MD;  Location: [facility];  Service: Gastroenterology;;   BIOPSY  04/10/2023   Procedure: BIOPSY;  Surgeon: Norma Fredrickson, Boykin Nearing, MD;  Location: [facility];  Service: Gastroenterology;;   CARDIAC CATHETERIZATION     CARDIOVERSION [name] 02/13/2022   Procedure: CARDIOVERSION;  Surgeon: Lamar Blinks, MD;  Location: [facility];  Service: Cardiovascular;  Laterality: [name];   CARDIOVERSION [name] 04/23/2023   Procedure: CARDIOVERSION;  Surgeon: Antonieta Iba, MD;  Location: [facility];  Service: Cardiovascular;  Laterality: [name];   COLONOSCOPY WITH PROPOFOL [name] 11/18/2015   Procedure: COLONOSCOPY WITH PROPOFOL;  Surgeon: Scot Jun, MD;  Location: [facility];  Service: Endoscopy;  Laterality: [name];   CORONARY ARTERY BYPASS GRAFT     CORONARY STENT INTERVENTION [name] 02/02/2020   Procedure: CORONARY STENT INTERVENTION;  Surgeon: Marcina Millard, MD;  Location: [facility];  Service: Cardiovascular;  Laterality: [name];   ESOPHAGOGASTRODUODENOSCOPY (EGD) WITH PROPOFOL [name] 03/15/2023   Procedure: ESOPHAGOGASTRODUODENOSCOPY (EGD) WITH PROPOFOL;  Surgeon: Wyline Mood, MD;  Location: [facility] ENDOSCOPY;  Service: Gastroenterology;  Laterality: [name];    FLEXIBLE SIGMOIDOSCOPY [name] 04/10/2023   Procedure: FLEXIBLE SIGMOIDOSCOPY;  Surgeon: Toledo, Boykin Nearing, MD;  Location: [facility];  Service: Gastroenterology;  Laterality: [name];   GASTROSTOMY [name] 03/18/2023   Procedure: INSERTION OF GASTROSTOMY TUBE;  Surgeon: Carolan Shiver, MD;  Location: [facility];  Service: General;  Laterality: [name];   IMPACTION REMOVAL  03/15/2023   Procedure: IMPACTION REMOVAL;  Surgeon: Wyline Mood, MD;  Location[facility];  Service: Gastroenterology;;   INCISION AND DRAINAGE Left 08/07/2021   Procedure: INCISION AND DRAINAGE-Partial Calcanectomy;  Surgeon: Rosetta Posner, DPM;  Location: [facility];  Service: Podiatry;  Laterality: Left;   IRRIGATION AND DEBRIDEMENT FOOT Left 02/29/2020   Procedure: IRRIGATION AND DEBRIDEMENT FOOT;  Surgeon: Rosetta Posner, DPM;  Location: [facility];  Service: Podiatry;  Laterality: Left;   IRRIGATION AND DEBRIDEMENT FOOT Left 02/24/2020   Procedure: IRRIGATION AND DEBRIDEMENT FOOT;  Surgeon: Rosetta Posner, DPM;  Location: [facility];  Service: Podiatry;  Laterality: Left;   KNEE ARTHROSCOPY     LEFT HEART CATH AND CORS/GRAFTS ANGIOGRAPHY [name] 02/02/2020   Procedure: LEFT HEART CATH AND CORS/GRAFTS ANGIOGRAPHY;  Surgeon: Dalia Heading, MD;  Location: [facility];  Service: Cardiovascular;  Laterality: [name];   LOWER EXTREMITY ANGIOGRAPHY Left 02/25/2020   Procedure: Lower Extremity Angiography;  Surgeon: Annice Needy, MD;  Location: [facility];  Service: Cardiovascular;  Laterality: Left;   LOWER EXTREMITY ANGIOGRAPHY Left 01/04/2021   Procedure: LOWER EXTREMITY ANGIOGRAPHY;  Surgeon: Annice Needy, MD;  Location: [facility];  Service: Cardiovascular;  Laterality: Left;   LOWER EXTREMITY ANGIOGRAPHY Right 05/21/2022   Procedure: Lower Extremity Angiography;  Surgeon: Annice Needy, MD;  Location: [facility];  Service: Cardiovascular;  Laterality: Right;   METATARSAL HEAD EXCISION Left  01/10/2021   Procedure: METATARSAL HEAD EXCISION - LEFT 5th;  Surgeon: Rosetta Posner, DPM;  Location: [facility];  Service: Podiatry;  Laterality: Left;   PERIPHERAL VASCULAR CATHETERIZATION Right 01/24/2016   Procedure: Lower Extremity Angiography;  Surgeon: Renford Dills, MD;  Location: [facility];  Service: Cardiovascular;  Laterality: Right;   PERIPHERAL VASCULAR CATHETERIZATION Right 01/25/2016   Procedure: Lower Extremity Angiography;  Surgeon: Renford Dills, MD;  Location: [facility];  Service: Cardiovascular;  Laterality: Right;   PORTACATH PLACEMENT [name] 03/18/2023   Procedure: INSERTION PORT-A-CATH;  Surgeon: Carolan Shiver, MD;  Location: [facility];  Service: General;  Laterality: [name];   TOE AMPUTATION     TONSILLECTOMY      MEDICATIONS:  Prior to Admission medications   Medication Sig Start Date End Date Taking? Authorizing Provider  acetaminophen (TYLENOL) 325 MG tablet Place 2 tablets (650 mg total) into feeding tube every 6 (six) hours as needed for mild pain (pain score 1-3) (or Fever >/= 101). 03/22/23   Lurene Shadow, MD  amiodarone (PACERONE) 200 MG tablet Take twice a day through 1/7 and then once daily 05/03/23   Wouk, Wilfred Curtis, MD  apixaban (ELIQUIS) 5 MG TABS tablet Place 1  tablet (5 mg total) into feeding tube 2 (two) times daily. 03/22/23   Lurene Shadow, MD  ascorbic acid (VITAMIN C) 500 MG tablet Place 1 tablet (500 mg total) into feeding tube daily. 03/22/23   Lurene Shadow, MD  Cholecalciferol 25 MCG (1000 UT) tablet Place 1 tablet (1,000 Units total) into feeding tube daily. Patient taking differently: Place 2,000 Units into feeding tube daily. 03/22/23   Lurene Shadow, MD  clonazePAM (KLONOPIN) 0.5 MG tablet Place 1 tablet (0.5 mg total) into feeding tube 2 (two) times daily. 03/22/23   Lurene Shadow, MD  Cyanocobalamin (VITAMIN B-12) 5000 MCG TBDP Place 10,000 mcg into feeding tube daily. 03/22/23   Lurene Shadow, MD   diphenoxylate-atropine (LOMOTIL) 2.5-0.025 MG tablet Place 1 tablet into feeding tube 4 (four) times daily. 05/03/23   Wouk, Wilfred Curtis, MD  ezetimibe (ZETIA) 10 MG tablet Place 1 tablet (10 mg total) into feeding tube at bedtime. 03/22/23   Lurene Shadow, MD  ferrous sulfate 300 (60 Fe) MG/5ML syrup Place 5 mLs (300 mg total) into feeding tube daily. Patient taking differently: Place 6.8 mLs into feeding tube daily. 03/22/23   Lurene Shadow, MD  fiber supplement, BANATROL TF, liquid Place 60 mLs into feeding tube 2 (two) times daily. 05/03/23   Wouk, Wilfred Curtis, MD  finasteride (PROSCAR) 5 MG tablet Take 1 tablet by mouth daily. 02/25/23   [provider]  Infant Care Products [facility]) OINT Apply 1 Application topically daily as needed.    [provider]  insulin aspart (NOVOLOG) 100 UNIT/ML injection Inject 0-9 Units into the skin 3 (three) times daily with meals. 05/03/23   Wouk, Wilfred Curtis, MD  insulin glargine-yfgn (SEMGLEE) 100 UNIT/ML injection Inject 0.4 mLs (40 Units total) into the skin at bedtime. 05/03/23   Wouk, Wilfred Curtis, MD  levothyroxine (SYNTHROID) 25 MCG tablet Place 1 tablet (25 mcg total) into feeding tube daily at 6 (six) AM. 03/22/23   Lurene Shadow, MD  loperamide HCl (IMODIUM) 1 MG/7.5ML suspension Place 15 mLs (2 mg total) into feeding tube every 6 (six) hours as needed for diarrhea or loose stools. 05/03/23   Wouk, Wilfred Curtis, MD  metoprolol tartrate (LOPRESSOR) 25 MG tablet Take 0.5 tablets (12.5 mg total) by mouth 2 (two) times daily. 05/03/23   Wouk, Wilfred Curtis, MD  Multiple Vitamins-Minerals (MULTIVITAMIN WITH MINERALS) tablet Take 1 tablet by mouth daily.    [provider]  nitroGLYCERIN (NITROSTAT) 0.4 MG SL tablet Place 1 tablet (0.4 mg total) under the tongue every 5 (five) minutes as needed for chest pain. 06/20/22   [name], MD  Nutritional Supplements (FEEDING SUPPLEMENT, GLUCERNA 1.5 CAL,) LIQD Place 237 mLs into  feeding tube 6 (six) times daily. 05/03/23   Wouk, Wilfred Curtis, MD  nystatin (MYCOSTATIN/NYSTOP) powder Apply 1 Application topically 2 (two) times daily. Under belly    [provider]  ondansetron (ZOFRAN) 4 MG tablet Place 1 tablet (4 mg total) into feeding tube every 6 (six) hours as needed for nausea. 03/22/23   Lurene Shadow, MD  pramipexole (MIRAPEX) 1 MG tablet Place 2 tablets (2 mg total) into feeding tube at bedtime. 03/22/23   Lurene Shadow, MD  primidone (MYSOLINE) 250 MG tablet Place 1 tablet (250 mg total) into feeding tube 2 (two) times daily. 03/22/23   Lurene Shadow, MD  QUEtiapine (SEROQUEL) 25 MG tablet Place 1 tablet (25 mg total) into feeding tube at bedtime. 05/03/23   Wouk, Wilfred Curtis, MD  risperiDONE (RISPERDAL) 0.5  MG tablet Place 1 tablet (0.5 mg total) into feeding tube 2 (two) times daily as needed (agitation). 05/03/23   Wouk, Wilfred Curtis, MD  sodium zirconium cyclosilicate (LOKELMA) 5 g packet Take 5 g by mouth daily. 05/03/23   Wouk, Wilfred Curtis, MD  tamsulosin (FLOMAX) 0.4 MG CAPS capsule Take 0.4 mg by mouth.    [provider]  Vitamins A & D (VITAMIN A & D) ointment Apply 1 Application topically as needed for dry skin. Apply to stumps    [provider]  Water For Irrigation, Sterile (FREE WATER) SOLN Place 30 mLs into feeding tube 6 (six) times daily. 05/03/23   Kathrynn Running, MD    Physical Exam   Triage Vital Signs: ED Triage Vitals [05/22/23 1828]  Encounter Vitals Group     BP 112/67     Systolic BP Percentile      Diastolic BP Percentile      Pulse Rate 93     Resp 18     Temp 98.8 F (37.1 C)     Temp Source Oral     SpO2 93 %     Weight 262 lb 5.6 oz (119 kg)     Height 5\' 5"  (1.651 m)     Head Circumference      [facility]      Pain Score 0     Pain Loc      Pain Education      Exclude from Growth Chart     Most recent vital signs: Vitals:   05/23/23 0148 05/23/23 0231  BP:  115/69  Pulse: 89 89  Resp:   16  Temp: 98.1 F (36.7 C)   SpO2: 91% 96%    CONSTITUTIONAL: Alert, responds appropriately to questions.  Pleasant, no distress HEAD: Normocephalic, atraumatic EYES: Conjunctivae clear, pupils appear equal, sclera nonicteric ENT: normal nose; moist mucous membranes NECK: Supple, normal ROM CARD: RRR; S1 and S2 appreciated RESP: Normal chest excursion without splinting or tachypnea; breath sounds clear and equal bilaterally; no wheezes, no rhonchi, no rales, no hypoxia or respiratory distress, speaking full sentences ABD/GI: Non-distended; soft, non-tender, no rebound, no guarding, no peritoneal signs, gastrostomy tube in place BACK: The back appears normal EXT: Normal ROM in all joints; no deformity noted, no edema status post bilateral BKA SKIN: Normal color for age and race; warm; no rash on exposed skin NEURO: Moves all extremities equally, normal speech PSYCH: The patient's mood and manner are appropriate.   ED Results / Procedures / Treatments   LABS: (all labs ordered are listed, but only abnormal results are displayed) Labs Reviewed  CBC - Abnormal; Notable for the following components:      Result Value   RBC 3.39 (*)    Hemoglobin 10.1 (*)    HCT 32.7 (*)    RDW 16.8 (*)    All other components within normal limits  BASIC METABOLIC PANEL - Abnormal; Notable for the following components:   Glucose, Bld 189 (*)    BUN 30 (*)    Calcium 8.6 (*)    All other components within normal limits  TROPONIN I (HIGH SENSITIVITY)  TROPONIN I (HIGH SENSITIVITY)     EKG:  EKG Interpretation Date/Time:  Wednesday May 22 2023 18:32:19 EST Ventricular Rate:  99 PR Interval:  144 QRS Duration:  146 QT Interval:  376 QTC Calculation: 482 R Axis:   -89  Text Interpretation: Sinus rhythm with occasional and consecutive Premature ventricular complexes Right  bundle branch block Left anterior fascicular block Bifascicular block Possible Lateral infarct , age undetermined  Abnormal ECG When compared with ECG of 23-Apr-2023 14:05, PREVIOUS ECG IS PRESENT Confirmed by Rochele Raring [phone]) on 05/22/2023 11:07:04 PM         RADIOLOGY: My personal review and interpretation of imaging: Chest x-ray clear.  I have personally reviewed all radiology reports.   DG Chest 2 View Result Date: 05/22/2023 CLINICAL DATA:  Chest pain. EXAM: CHEST - 2 VIEW COMPARISON:  Chest radiograph dated 03/28/2023. FINDINGS: Right-sided Port-A-Cath with tip over upper SVC. There are bibasilar atelectasis. No focal consolidation, pleural effusion, pneumothorax. Stable mild cardiomegaly. Median sternotomy wires. No acute osseous pathology. IMPRESSION: 1. No active cardiopulmonary disease. 2. Mild cardiomegaly. Electronically Signed   By: Elgie Collard M.D.   On: 05/22/2023 19:13     PROCEDURES:  Critical Care performed: No    .1-3 Lead EKG Interpretation  Performed by: Brenley Priore, Layla Maw, DO Authorized by: Guenevere Roorda, Layla Maw, DO     Interpretation: normal     ECG rate:  89   ECG rate assessment: normal     Rhythm: sinus rhythm     Ectopy: none     Conduction: normal       IMPRESSION / MDM / ASSESSMENT AND PLAN / ED COURSE  I reviewed the triage vital signs and the nursing notes.    Patient here with chest pain now resolved after 1 nitroglycerin.  The patient is on the cardiac monitor to evaluate for evidence of arrhythmia and/or significant heart rate changes.   DIFFERENTIAL DIAGNOSIS (includes but not limited to):   Stable angina, ACS, PE on the differential but less likely given patient now asymptomatic, doubt dissection, pneumonia, pneumothorax, CHF exacerbation   Patient's presentation is most consistent with acute presentation with potential threat to life or bodily function.   PLAN: EKG shows no new ischemic change compared to previous.  First troponin negative.  Mild stable anemia.  Normal electrolytes.  Repeat troponin pending.  Chest x-ray reviewed and  interpreted by myself and the radiologist and shows cardiomegaly but no opacity, edema.   MEDICATIONS GIVEN IN ED: Medications - No data to display   ED COURSE: Second troponin negative.  Patient still asymptomatic and hemodynamically stable.  I feel he is safe to be discharged back to his nursing facility and follow-up with his cardiologist as scheduled on February 3.  He has nitroglycerin prescription for the nursing facility.  Suspect symptoms today secondary to stable angina.   At this time, I do not feel there is any life-threatening condition present. I reviewed all nursing notes, vitals, pertinent previous records.  All lab and urine results, EKGs, imaging ordered have been independently reviewed and interpreted by myself.  I reviewed all available radiology reports from any imaging ordered this visit.  Based on my assessment, I feel the patient is safe to be discharged home without further emergent workup and can continue workup as an outpatient as needed. Discussed all findings, treatment plan as well as usual and customary return precautions.  They verbalize understanding and are comfortable with this plan.  Outpatient follow-up has been provided as needed.  All questions have been answered.    CONSULTS:  none   OUTSIDE RECORDS REVIEWED: Reviewed recent cardiology notes.       FINAL CLINICAL IMPRESSION(S) / ED DIAGNOSES   Final diagnoses:  Nonspecific chest pain     Rx / DC Orders   ED Discharge Orders  None        Note:  This document was prepared using Dragon voice recognition software and may include unintentional dictation errors.   Xitlalli Newhard, Layla Maw, DO 05/23/23 [phone]

## 2023-05-23 NOTE — ED Notes (Signed)
called to [facility] for pt transport to facility @ [address]/[facility]/rep operator [phone].

## 2023-05-24 ENCOUNTER — Ambulatory Visit: Payer: Medicare Other | Admitting: Student

## 2023-05-24 ENCOUNTER — Telehealth: Payer: Self-pay | Admitting: *Deleted

## 2023-05-24 NOTE — Telephone Encounter (Signed)
The wife left a message and I called her back and she was very upset because she said several times that she needed to know at least 1 week before an appointment because he is no longer in the nursing home and he has to use [facility] and the wife is the only 1 to do that.  She said that she had her eye worked on and she does not need stress per the doctor who did the procedure.  And she has a lots of stress the husband is at home she is taking care of him and trying to take care of her own self.  There is an appointment for next Tuesday for simulation for radiation and it at 10:30 and then he has a another appointment same day but the appointment is at 2:45 this patient is a bilateral BKA and that is the only way that the patient can get over here for visits so we need to change this time so that they would be closer together so he is not can to be staying here for like 4 hours.  And again the patient's wife says that she has to know about all of the visits and it has to be at least a week before you need these appointments and she wants people to tell her what is going on she feels like nobody is keeping her in the loop.  At the end she did say that Dr. Donneta Romberg had called her several times about the patient.  While

## 2023-05-27 DIAGNOSIS — Z510 Encounter for antineoplastic radiation therapy: Secondary | ICD-10-CM | POA: Diagnosis not present

## 2023-05-27 DIAGNOSIS — C155 Malignant neoplasm of lower third of esophagus: Secondary | ICD-10-CM | POA: Diagnosis present

## 2023-05-28 ENCOUNTER — Encounter: Payer: Self-pay | Admitting: Internal Medicine

## 2023-05-28 ENCOUNTER — Other Ambulatory Visit: Payer: Self-pay

## 2023-05-28 ENCOUNTER — Ambulatory Visit
Admission: RE | Admit: 2023-05-28 | Discharge: 2023-05-28 | Disposition: A | Discharge status: Home / Self Care | Payer: Medicare Other | Source: Ambulatory Visit | Attending: Radiation Oncology | Admitting: Radiation Oncology

## 2023-05-28 ENCOUNTER — Other Ambulatory Visit: Payer: Self-pay | Admitting: *Deleted

## 2023-05-28 ENCOUNTER — Inpatient Hospital Stay: Payer: Medicare Other | Attending: Internal Medicine | Admitting: Internal Medicine

## 2023-05-28 VITALS — BP 84/51 | HR 80 | Temp 97.0°F

## 2023-05-28 DIAGNOSIS — E1151 Type 2 diabetes mellitus with diabetic peripheral angiopathy without gangrene: Secondary | ICD-10-CM | POA: Insufficient documentation

## 2023-05-28 DIAGNOSIS — Z8619 Personal history of other infectious and parasitic diseases: Secondary | ICD-10-CM | POA: Insufficient documentation

## 2023-05-28 DIAGNOSIS — Z7901 Long term (current) use of anticoagulants: Secondary | ICD-10-CM | POA: Insufficient documentation

## 2023-05-28 DIAGNOSIS — C155 Malignant neoplasm of lower third of esophagus: Secondary | ICD-10-CM | POA: Diagnosis present

## 2023-05-28 DIAGNOSIS — I4891 Unspecified atrial fibrillation: Secondary | ICD-10-CM | POA: Diagnosis not present

## 2023-05-28 DIAGNOSIS — E11649 Type 2 diabetes mellitus with hypoglycemia without coma: Secondary | ICD-10-CM | POA: Insufficient documentation

## 2023-05-28 DIAGNOSIS — Z87440 Personal history of urinary (tract) infections: Secondary | ICD-10-CM | POA: Insufficient documentation

## 2023-05-28 DIAGNOSIS — I2510 Atherosclerotic heart disease of native coronary artery without angina pectoris: Secondary | ICD-10-CM | POA: Insufficient documentation

## 2023-05-28 DIAGNOSIS — F17290 Nicotine dependence, other tobacco product, uncomplicated: Secondary | ICD-10-CM | POA: Insufficient documentation

## 2023-05-28 DIAGNOSIS — S37011A Minor contusion of right kidney, initial encounter: Secondary | ICD-10-CM | POA: Insufficient documentation

## 2023-05-28 LAB — RAD ONC ARIA SESSION SUMMARY
Course Elapsed Days: 0
Plan Fractions Treated to Date: 1
Plan Prescribed Dose Per Fraction: 2 Gy
Plan Total Fractions Prescribed: 25
Plan Total Prescribed Dose: 50 Gy
Reference Point Dosage Given to Date: 2 Gy
Reference Point Session Dosage Given: 2 Gy
Session Number: 1

## 2023-05-28 MED ORDER — ONDANSETRON HCL 4 MG PO TABS: 4.0000 mg | ORAL_TABLET | Freq: Three times a day (TID) | ORAL | Status: DC | PRN

## 2023-05-28 MED ORDER — LIDOCAINE-PRILOCAINE 2.5-2.5 % EX CREA: 1.0000 | TOPICAL_CREAM | CUTANEOUS | 0 refills | Status: DC | PRN

## 2023-05-28 NOTE — Progress Notes (Signed)
[name] CONSULT NOTE  Patient Care Team: [name], MD as PCP - General (Internal Medicine) [name], MD as PCP - Cardiology (Cardiology) [name], MD as Consulting Physician (Oncology)  CHIEF COMPLAINTS/PURPOSE OF CONSULTATION: esophagus cancer  Oncology History Overview Note  IMPRESSION: 1. Distal esophageal cancer with extension into the proximal stomach and metastatic high left paratracheal and gastrohepatic ligament lymph nodes. 2. Chronic right perinephric hematoma with associated peripheral hypermetabolism. 3. Sludge and or gallstones. 4.  Aortic atherosclerosis (ICD10-I70.0). 5. Enlarged pulmonic trunk, indicative of pulmonary arterial hypertension.     Electronically Signed   By: [name] M.D.   On: 05/22/2023 15:15  # JAN 28th, 2025- Start single agent radiation no chemotherapy.   # COPD CHF CAD; chronic UTI; BKA ; diabetes   Esophageal adenocarcinoma (HCC)  03/15/2023 Initial Diagnosis   Esophageal adenocarcinoma (HCC)   04/25/2023 -  Chemotherapy   Patient is on Treatment Plan : ESOPHAGUS Carboplatin + Paclitaxel Weekly X 6 Weeks with XRT     Malignant neoplasm of lower third of esophagus (HCC)  04/11/2023 Initial Diagnosis   Malignant neoplasm of lower third of esophagus (HCC)   05/28/2023 Cancer Staging   Staging form: Esophagus - Adenocarcinoma, AJCC 8th Edition - Clinical: Stage III (cT4a, cN1, cM0) - Signed by [name], MD on 05/28/2023      HISTORY OF PRESENTING ILLNESS: Patient ambulating- wheel chair/- s/p Bil BKA-   Alone/Accompanied by family- wife.  [name]. 68 y.o.  male pleasant patient with a multiple medical problems including COPD/CHF history of bilateral BKA; chronic UTIs-in the newly diagnosed esophageal cancer is here for follow-up.  Patient was seen in the hospital November December 2024 when he presented to the hospital for difficulty  swallowing/dysphagia.  Patient s/p endoscopy diagnosed with adenocarcinoma lower lip esophagus.   Patient had multiple admission to hospital for the next couple of months-hyperglycemia poorly controlled diabetes/acute renal failure; C. difficile came treatment with vancomycin.  Patient is currently status post PEG tube placement/on tube feeds.  [facility]-however wife decided to bring him home.  Patient s/p evaluation with radiation oncology.  Status post simulation today.   Review of Systems  Constitutional:  Positive for malaise/fatigue and weight loss. Negative for chills, diaphoresis and fever.  HENT:  Negative for nosebleeds and sore throat.   Eyes:  Negative for double vision.  Respiratory:  Negative for cough, hemoptysis, sputum production, shortness of breath and wheezing.   Cardiovascular:  Negative for chest pain, palpitations, orthopnea and leg swelling.  Gastrointestinal:  Negative for abdominal pain, blood in stool, constipation, diarrhea, heartburn, melena, nausea and vomiting.  Genitourinary:  Negative for dysuria, frequency and urgency.  Musculoskeletal:  Positive for back pain and joint pain.  Skin: Negative.  Negative for itching and rash.  Neurological:  Negative for dizziness, tingling, focal weakness, weakness and headaches.  Endo/Heme/Allergies:  Does not bruise/bleed easily.  Psychiatric/Behavioral:  Negative for depression. The patient is not nervous/anxious and does not have insomnia.     MEDICAL HISTORY:  Past Medical History:  Diagnosis Date   Arrhythmia    atrial fibrillation   Asthma    CHF (congestive heart failure) (HCC)    COPD (chronic obstructive pulmonary disease) (HCC)    Coronary artery disease    Depression    Diabetes mellitus without complication (HCC)    Gout    History anabolic steroid use    Hyperlipidemia  Hypertension    Hypogonadism in male    MI (myocardial infarction) (HCC)    Morbid obesity (HCC)     Myocardial infarction (HCC)    Peripheral vascular disease (HCC)    Perirectal abscess    Pleurisy    Sleep apnea    CPAP at night, no oxygen   Varicella     SURGICAL HISTORY: Past Surgical History:  Procedure Laterality Date   ABDOMINAL AORTIC ANEURYSM REPAIR     ACHILLES TENDON SURGERY Left 01/10/2021   Procedure: ACHILLES LENGTHENING/KIDNER;  Surgeon: Rosetta Posner, DPM;  Location: [facility];  Service: Podiatry;  Laterality: Left;   AMPUTATION Left 10/03/2021   Procedure: AMPUTATION BELOW KNEE;  Surgeon: Renford Dills, MD;  Location: [facility];  Service: Vascular;  Laterality: Left;   AMPUTATION Right 05/24/2022   Procedure: AMPUTATION BELOW KNEE;  Surgeon: Annice Needy, MD;  Location: [facility];  Service: General;  Laterality: Right;   AMPUTATION TOE Right 02/10/2016   Procedure: AMPUTATION TOE 3RD TOE;  Surgeon: Gwyneth Revels, DPM;  Location: [facility];  Service: Podiatry;  Laterality: Right;   AMPUTATION TOE Left 02/24/2020   Procedure: AMPUTATION TOE;  Surgeon: Rosetta Posner, DPM;  Location: [facility];  Service: Podiatry;  Laterality: Left;   APPLICATION OF WOUND VAC Left 02/29/2020   Procedure: APPLICATION OF WOUND VAC;  Surgeon: Rosetta Posner, DPM;  Location: [facility];  Service: Podiatry;  Laterality: Left;   BIOPSY  03/15/2023   Procedure: BIOPSY;  Surgeon: Wyline Mood, MD;  Location: [facility];  Service: Gastroenterology;;   BIOPSY  04/10/2023   Procedure: BIOPSY;  Surgeon: Norma Fredrickson, Boykin Nearing, MD;  Location: [facility];  Service: Gastroenterology;;   CARDIAC CATHETERIZATION     CARDIOVERSION [name] 02/13/2022   Procedure: CARDIOVERSION;  Surgeon: Lamar Blinks, MD;  Location: [facility];  Service: Cardiovascular;  Laterality: [name];   CARDIOVERSION [name] 04/23/2023   Procedure: CARDIOVERSION;  Surgeon: Antonieta Iba, MD;  Location: [facility];  Service: Cardiovascular;  Laterality: [name];   COLONOSCOPY WITH PROPOFOL [name] 11/18/2015   Procedure: COLONOSCOPY WITH  PROPOFOL;  Surgeon: Scot Jun, MD;  Location: [facility];  Service: Endoscopy;  Laterality: [name];   CORONARY ARTERY BYPASS GRAFT     CORONARY STENT INTERVENTION [name] 02/02/2020   Procedure: CORONARY STENT INTERVENTION;  Surgeon: Marcina Millard, MD;  Location: [facility];  Service: Cardiovascular;  Laterality: [name];   ESOPHAGOGASTRODUODENOSCOPY (EGD) WITH PROPOFOL [name] 03/15/2023   Procedure: ESOPHAGOGASTRODUODENOSCOPY (EGD) WITH PROPOFOL;  Surgeon: Wyline Mood, MD;  Location: [facility];  Service: Gastroenterology;  Laterality: [name];   FLEXIBLE SIGMOIDOSCOPY [name] 04/10/2023   Procedure: FLEXIBLE SIGMOIDOSCOPY;  Surgeon: Toledo, Boykin Nearing, MD;  Location: [facility];  Service: Gastroenterology;  Laterality: [name];   GASTROSTOMY [name] 03/18/2023   Procedure: INSERTION OF GASTROSTOMY TUBE;  Surgeon: Carolan Shiver, MD;  Location: [facility];  Service: General;  Laterality: [name];   IMPACTION REMOVAL  03/15/2023   Procedure: IMPACTION REMOVAL;  Surgeon: Wyline Mood, MD;  Location: [facility];  Service: Gastroenterology;;   INCISION AND DRAINAGE Left 08/07/2021   Procedure: INCISION AND DRAINAGE-Partial Calcanectomy;  Surgeon: Rosetta Posner, DPM;  Location: [facility];  Service: Podiatry;  Laterality: Left;   IRRIGATION AND DEBRIDEMENT FOOT Left 02/29/2020   Procedure: IRRIGATION AND DEBRIDEMENT FOOT;  Surgeon: Rosetta Posner, DPM;  Location: [facility];  Service: Podiatry;  Laterality: Left;   IRRIGATION AND DEBRIDEMENT FOOT Left 02/24/2020   Procedure: IRRIGATION AND DEBRIDEMENT FOOT;  Surgeon: Rosetta Posner, DPM;  Location:  [facility];  Service: Podiatry;  Laterality: Left;   KNEE ARTHROSCOPY     LEFT HEART CATH AND CORS/GRAFTS ANGIOGRAPHY [name] 02/02/2020   Procedure: LEFT HEART CATH AND CORS/GRAFTS ANGIOGRAPHY;  Surgeon: Dalia Heading, MD;  Location: [facility];  Service: Cardiovascular;  Laterality: [name];   LOWER EXTREMITY ANGIOGRAPHY Left 02/25/2020    Procedure: Lower Extremity Angiography;  Surgeon: Annice Needy, MD;  Location: [facility];  Service: Cardiovascular;  Laterality: Left;   LOWER EXTREMITY ANGIOGRAPHY Left 01/04/2021   Procedure: LOWER EXTREMITY ANGIOGRAPHY;  Surgeon: Annice Needy, MD;  Location: [facility];  Service: Cardiovascular;  Laterality: Left;   LOWER EXTREMITY ANGIOGRAPHY Right 05/21/2022   Procedure: Lower Extremity Angiography;  Surgeon: Annice Needy, MD;  Location: [facility];  Service: Cardiovascular;  Laterality: Right;   METATARSAL HEAD EXCISION Left 01/10/2021   Procedure: METATARSAL HEAD EXCISION - LEFT 5th;  Surgeon: Rosetta Posner, DPM;  Location: [facility];  Service: Podiatry;  Laterality: Left;   PERIPHERAL VASCULAR CATHETERIZATION Right 01/24/2016   Procedure: Lower Extremity Angiography;  Surgeon: Renford Dills, MD;  Location: [facility];  Service: Cardiovascular;  Laterality: Right;   PERIPHERAL VASCULAR CATHETERIZATION Right 01/25/2016   Procedure: Lower Extremity Angiography;  Surgeon: Renford Dills, MD;  Location: [facility];  Service: Cardiovascular;  Laterality: Right;   PORTACATH PLACEMENT [name] 03/18/2023   Procedure: INSERTION PORT-A-CATH;  Surgeon: Carolan Shiver, MD;  Location: [facility];  Service: General;  Laterality: [name];   TOE AMPUTATION     TONSILLECTOMY      SOCIAL HISTORY: Social History   Socioeconomic History   Marital status: Married    Spouse name: Not on file   Number of children: Not on file   Years of education: Not on file   Highest education level: Not on file  Occupational History   Not on file  Tobacco Use   Smoking status: Former    Current packs/day: 0.00    Average packs/day: 0.5 packs/day for 45.0 years (22.5 ttl pk-yrs)    Types: Cigarettes    Start date: 04/04/1970    Quit date: 04/05/2015    Years since quitting: 8.1   Smokeless tobacco: Never  Vaping Use   Vaping status: Every Day  Substance and  Sexual Activity   Alcohol use: Not Currently    Alcohol/week: 3.0 standard drinks of alcohol    Types: 3 Glasses of wine per week   Drug use: No   Sexual activity: Not on file  Other Topics Concern   Not on file  Social History Narrative   Not on file   Social Drivers of Health   Financial Resource Strain: Low Risk  (09/28/2022)   Received from [facility], [facility]   Overall Financial Resource Strain (CARDIA)    Difficulty of Paying Living Expenses: Not hard at all  Food Insecurity: No Food Insecurity (03/29/2023)   Hunger Vital Sign    Worried About Running Out of Food in the Last Year: Never true    Ran Out of Food in the Last Year: Never true  Transportation Needs: No Transportation Needs (03/29/2023)   PRAPARE - Administrator, Civil Service (Medical): No    Lack of Transportation (Non-Medical): No  Physical Activity: Not on file  Stress: Not on file  Social Connections: Socially Isolated (04/30/2023)   Social Connection and Isolation Panel [NHANES]    Frequency  of Communication with Friends and Family: Never    Frequency of Social Gatherings with Friends and Family: Never    Attends Religious Services: Never    Database administrator or Organizations: No    Attends Banker Meetings: Never    Marital Status: Married  Catering manager Violence: Not At Risk (03/29/2023)   Humiliation, Afraid, Rape, and Kick questionnaire    Fear of Current or Ex-Partner: No    Emotionally Abused: No    Physically Abused: No    Sexually Abused: No    FAMILY HISTORY: Family History  Problem Relation Age of Onset   Hypertension Father    Coronary artery disease Father    Alcohol abuse Father    Heart failure Brother     ALLERGIES:  is allergic to penicillins, statins, and metformin and related.  MEDICATIONS:  Current Outpatient Medications  Medication Sig Dispense Refill   acetaminophen (TYLENOL) 325 MG tablet Place  2 tablets (650 mg total) into feeding tube every 6 (six) hours as needed for mild pain (pain score 1-3) (or Fever >/= 101).     amiodarone (PACERONE) 200 MG tablet Take twice a day through 1/7 and then once daily     apixaban (ELIQUIS) 5 MG TABS tablet Place 1 tablet (5 mg total) into feeding tube 2 (two) times daily.     ascorbic acid (VITAMIN C) 500 MG tablet Place 1 tablet (500 mg total) into feeding tube daily.     Cholecalciferol 25 MCG (1000 UT) tablet Place 1 tablet (1,000 Units total) into feeding tube daily. (Patient taking differently: Place 2,000 Units into feeding tube daily.)     clonazePAM (KLONOPIN) 0.5 MG tablet Place 1 tablet (0.5 mg total) into feeding tube 2 (two) times daily.     Cyanocobalamin (VITAMIN B-12) 5000 MCG TBDP Place 10,000 mcg into feeding tube daily.     diphenoxylate-atropine (LOMOTIL) 2.5-0.025 MG tablet Place 1 tablet into feeding tube 4 (four) times daily.     ezetimibe (ZETIA) 10 MG tablet Place 1 tablet (10 mg total) into feeding tube at bedtime.     ferrous sulfate 300 (60 Fe) MG/5ML syrup Place 5 mLs (300 mg total) into feeding tube daily. (Patient taking differently: Place 6.8 mLs into feeding tube daily.) 150 mL 0   finasteride (PROSCAR) 5 MG tablet Take 1 tablet by mouth daily.     gabapentin (NEURONTIN) 300 MG capsule Take 1 capsule by mouth at bedtime.     HUMALOG 100 UNIT/ML injection Inject into the skin.     Infant Care Products [facility]) OINT Apply 1 Application topically daily as needed.     insulin glargine (LANTUS) 100 UNIT/ML injection Inject into the skin.     levothyroxine (SYNTHROID) 25 MCG tablet Place 1 tablet (25 mcg total) into feeding tube daily at 6 (six) AM.     lidocaine-prilocaine (EMLA) cream Apply 1 Application topically as needed. 30 g 0   loperamide HCl (IMODIUM) 1 MG/7.5ML suspension Place 15 mLs (2 mg total) into feeding tube every 6 (six) hours as needed for diarrhea or loose stools.     metoprolol tartrate (LOPRESSOR) 25  MG tablet Take 0.5 tablets (12.5 mg total) by mouth 2 (two) times daily.     Multiple Vitamins-Minerals (MULTIVITAMIN WITH MINERALS) tablet Take 1 tablet by mouth daily.     nitroGLYCERIN (NITROSTAT) 0.4 MG SL tablet Place 1 tablet (0.4 mg total) under the tongue every 5 (five) minutes as needed for chest pain. 25  tablet 3   Nutritional Supplements (FEEDING SUPPLEMENT, GLUCERNA 1.5 CAL,) LIQD Place 237 mLs into feeding tube 6 (six) times daily.     nystatin (MYCOSTATIN/NYSTOP) powder Apply 1 Application topically 2 (two) times daily. Under belly     pramipexole (MIRAPEX) 1 MG tablet Place 2 tablets (2 mg total) into feeding tube at bedtime.     primidone (MYSOLINE) 250 MG tablet Place 1 tablet (250 mg total) into feeding tube 2 (two) times daily.     tamsulosin (FLOMAX) 0.4 MG CAPS capsule Take 0.4 mg by mouth.     Vitamins A & D (VITAMIN A & D) ointment Apply 1 Application topically as needed for dry skin. Apply to stumps     Water For Irrigation, Sterile (FREE WATER) SOLN Place 30 mLs into feeding tube 6 (six) times daily.     fiber supplement, BANATROL TF, liquid Place 60 mLs into feeding tube 2 (two) times daily. (Patient not taking: Reported on 05/28/2023)     ondansetron (ZOFRAN) 4 MG tablet Place 1 tablet (4 mg total) into feeding tube every 8 (eight) hours as needed for nausea.     No current facility-administered medications for this visit.    PHYSICAL EXAMINATION:   Vitals:   05/28/23 1353  BP: (!) 84/51  Pulse: 80  Temp: (!) 97 F (36.1 C)  SpO2: 93%   There were no vitals filed for this visit.  Foley catheter in place.  Physical Exam Vitals and nursing note reviewed.  HENT:     Head: Normocephalic and atraumatic.     Mouth/Throat:     Pharynx: Oropharynx is clear.  Eyes:     Extraocular Movements: Extraocular movements intact.     Pupils: Pupils are equal, round, and reactive to light.  Cardiovascular:     Rate and Rhythm: Normal rate and regular rhythm.   Pulmonary:     Comments: Decreased breath sounds bilaterally.  Abdominal:     Palpations: Abdomen is soft.  Musculoskeletal:     Cervical back: Normal range of motion.     Comments: Bil LE BKA.   Skin:    General: Skin is warm.  Neurological:     General: No focal deficit present.     Mental Status: He is alert and oriented to person, place, and time.  Psychiatric:        Behavior: Behavior normal.        Judgment: Judgment normal.     LABORATORY DATA:  I have reviewed the data as listed Lab Results  Component Value Date   WBC 10.5 05/22/2023   HGB 10.1 (L) 05/22/2023   HCT 32.7 (L) 05/22/2023   MCV 96.5 05/22/2023   PLT 172 05/22/2023   Recent Labs    03/14/23 1618 03/15/23 0402 03/28/23 0011 03/28/23 0240 04/17/23 0712 04/18/23 0829 05/02/23 0529 05/03/23 0633 05/22/23 2056  NA 137   < > 133*   < > 135   < > 133* 134* 137  K 4.4   < > 6.5*   < > 5.2*   < > 5.0 5.5* 4.7  CL 98   < > 94*   < > 103   < > 95* 94* 98  CO2 30   < > 30   < > 24   < > 26 29 27   GLUCOSE 193*   < > 252*   < > 241*   < > 192* 228* 189*  BUN 25*   < > 61*   < >  38*   < > 50* 53* 30*  CREATININE 1.27*   < > 1.71*   < > 1.11   < > 1.26* 1.32* 0.97  CALCIUM 8.9   < > 8.6*   < > 8.9   < > 9.0 9.1 8.6*  GFRNONAA >60   < > 43*   < > >60   < > >60 59* >60  PROT 7.3  --  6.5  --  6.6  --   --   --   --   ALBUMIN 3.7  --  2.8*  --  2.4*  --   --   --   --   AST 15  --  14*  --  17  --   --   --   --   ALT 19  --  18  --  34  --   --   --   --   ALKPHOS 122  --  120  --  129*  --   --   --   --   BILITOT 0.4  --  0.4  --  <0.2  --   --   --   --    < > = values in this interval not displayed.    RADIOGRAPHIC STUDIES: I have personally reviewed the radiological images as listed and agreed with the findings in the report. DG Chest 2 View Result Date: 05/22/2023 CLINICAL DATA:  Chest pain. EXAM: CHEST - 2 VIEW COMPARISON:  Chest radiograph dated 03/28/2023. FINDINGS: Right-sided Port-A-Cath  with tip over upper SVC. There are bibasilar atelectasis. No focal consolidation, pleural effusion, pneumothorax. Stable mild cardiomegaly. Median sternotomy wires. No acute osseous pathology. IMPRESSION: 1. No active cardiopulmonary disease. 2. Mild cardiomegaly. Electronically Signed   By: Elgie Collard M.D.   On: 05/22/2023 19:13   NM PET Image Initial (PI) Skull Base To Thigh (F-18 FDG) Result Date: 05/22/2023 CLINICAL DATA:  Initial treatment strategy for esophageal cancer. EXAM: NUCLEAR MEDICINE PET SKULL BASE TO THIGH TECHNIQUE: 10.8 mCi F-18 FDG was injected intravenously. Full-ring PET imaging was performed from the skull base to thigh after the radiotracer. CT data was obtained and used for attenuation correction and anatomic localization. Fasting blood glucose: 178 mg/dl COMPARISON:  CT abdomen pelvis 03/28/2023, CT chest 03/15/2023. FINDINGS: Mediastinal blood pool activity: SUV max 3.3 Liver activity: SUV max NA NECK: No abnormal hypermetabolism.  Misregistration artifact. Incidental CT findings: None. CHEST: Hypermetabolic high left paratracheal lymph node measures 6 mm, SUV max 6.7. Extensive hypermetabolism associated with distal esophageal, gastroesophageal junction and proximal gastric masslike wall thickening, SUV max 23.2. No additional abnormal hypermetabolism. Incidental CT findings: Right IJ Port-A-Cath terminates in the SVC. Atherosclerotic calcification of the aorta. Enlarged pulmonic trunk and heart. No pericardial or pleural effusion. ABDOMEN/PELVIS: Hypermetabolic gastrohepatic ligament lymph nodes with index lymph node measuring 7 mm (6/89), SUV max 5.5. Persistent fluid collection between the lower pole right kidney and right psoas, measuring roughly 4.0 x 11.5 cm, similar to 03/28/2023, with peripheral hypermetabolism. Inguinal lymph nodes do not have hypermetabolism above blood pool. Incidental CT findings: Sludge and/or stones in the gallbladder. Small low-attenuation lesion in  the left kidney. No specific follow-up necessary. Percutaneous gastrostomy. Atherosclerotic calcification of the aorta. Foley catheter in a decompressed bladder. SKELETON: No abnormal hypermetabolism. Incidental CT findings: Degenerative changes in the spine. IMPRESSION: 1. Distal esophageal cancer with extension into the proximal stomach and metastatic high left paratracheal and gastrohepatic ligament lymph nodes. 2. Chronic right perinephric  hematoma with associated peripheral hypermetabolism. 3. Sludge and or gallstones. 4.  Aortic atherosclerosis (ICD10-I70.0). 5. Enlarged pulmonic trunk, indicative of pulmonary arterial hypertension. Electronically Signed   By: [name] M.D.   On: 05/22/2023 15:15   DG Lumbar Spine 2-3 Views Result Date: 05/01/2023 CLINICAL DATA:  65452 Low back pain 16109 EXAM: LUMBAR SPINE - 2-3 VIEW COMPARISON:  03/28/2023 CT abdomen/pelvis FINDINGS: This report assumes 5 non rib-bearing lumbar vertebrae. Very limited visualization of the lumbar spine on the lateral and coned lateral views including essentially non visualization of L4 and L5, presumably due to technique and/or overlying soft tissues. Lumbar vertebral body heights are grossly preserved, with no fracture. Marked multilevel lumbar degenerative disc disease. No appreciable spondylolisthesis. Mild bilateral lower lumbar facet arthropathy. No aggressive appearing focal osseous lesions. IMPRESSION: 1. Very limited lumbar spine radiographs with nearly nondiagnostic lateral views. 2. No gross lumbar spine fracture or spondylolisthesis. 3. Marked multilevel lumbar degenerative disc disease. Mild bilateral lower lumbar facet arthropathy. Electronically Signed   By: Delbert Phenix M.D.   On: 05/01/2023 16:51     Malignant neoplasm of lower third of esophagus (HCC) # [id number]- stage IV- PET scan JAN 2025-  Distal esophageal cancer with extension into the proximal stomach and metastatic high left paratracheal and gastrohepatic  ligament lymph nodes; no evidence of distant metastatic disease. Unresectable.   # Recommend palliative radiation.  Hold off weekly chemotherapy as patient is a poor candidate for any systemic therapy at this time [given high risk of complications-recent multiple admission to hospital-UTI; acute renal failure; hypoglycemic; C. difficile colitis; and also the logistics/ transportation].  # Recommend follow-up in approximately 2 months from now-about a month or so from patient of radiation.  Discussed that we will plan repeating a scan post radiation about 2 to 3 months.  Based upon the results of the scan could consider palliative systemic therapy/immunotherapy-if patient tolerates radiation without any major complications.   # CAD/A.fib on eliquis-   # Right Kidney hematoma [Dr.Brandon]- 2024- stable.    # History of chronic UTI-[history of nitrofurantoin prophylaxis-Dr. Sparks]    # Poorly controlled diabetes [Dr. Sofie Rower insulin/status post PEG tube- currently better controlled []    # Hx C. difficile colitis- s/p  oral vancomycin- resolved.   # Retained Foley catheter- sent a message to Dr.Brandon.otherwise will refer to urology.     #  PVD/ BKA   #Incidental findings on Imaging  PET CT , 2024:  Chronic right perinephric hematoma with associated peripheral hypermetabolism Sludge and or gallstones;  Aortic atherosclerosis;  Enlarged pulmonic trunk, indicative of pulmonary arterial hypertension.I reviewed/discussed/counseled the patient.   # DISPOSITION: # follow up in 2 months- MD; port-labs- cbc/cmp- Dr.B  # I reviewed the blood work- with the patient in detail; also reviewed the imaging independently [as summarized above]; and with the patient in detail.   # 40 minutes face-to-face with the patient discussing the above plan of care; more than 50% of time spent on prognosis/ natural history; counseling and coordination.      Above plan of care was discussed with  patient/family in detail.  My contact information was given to the patient/family.       [name], MD 05/28/2023 4:29 PM

## 2023-05-28 NOTE — Progress Notes (Signed)
Pt. Was seen in the ED for chest pain 05/22/22.  PET 05/14/23.  Would like to know who/where to go to get the catheter out. Now has a feeding tube.  Needs Emla called in, pended.  Can he have vanilla ice cream?  States banatrol helped but was not given any to take home.

## 2023-05-28 NOTE — Assessment & Plan Note (Addendum)
#   [id number]- stage IV- PET scan JAN 2025-  Distal esophageal cancer with extension into the proximal stomach and metastatic high left paratracheal and gastrohepatic ligament lymph nodes; no evidence of distant metastatic disease. Unresectable.   # Recommend palliative radiation.  Hold off weekly chemotherapy as patient is a poor candidate for any systemic therapy at this time [given high risk of complications-recent multiple admission to hospital-UTI; acute renal failure; hypoglycemic; C. difficile colitis; and also the logistics/ transportation].  # Recommend follow-up in approximately 2 months from now-about a month or so from patient of radiation.  Discussed that we will plan repeating a scan post radiation about 2 to 3 months.  Based upon the results of the scan could consider palliative systemic therapy/immunotherapy-if patient tolerates radiation without any major complications.   # CAD/A.fib on eliquis-   # Right Kidney hematoma [Dr.Brandon]- 2024- stable.    # History of chronic UTI-[history of nitrofurantoin prophylaxis-Dr. Sparks]    # Poorly controlled diabetes [Dr. Sofie Rower insulin/status post PEG tube- currently better controlled []    # Hx C. difficile colitis- s/p  oral vancomycin- resolved.   # Retained Foley catheter- sent a message to Dr.Brandon.otherwise will refer to urology.     #  PVD/ BKA   #Incidental findings on Imaging  PET CT , 2024:  Chronic right perinephric hematoma with associated peripheral hypermetabolism Sludge and or gallstones;  Aortic atherosclerosis;  Enlarged pulmonic trunk, indicative of pulmonary arterial hypertension.I reviewed/discussed/counseled the patient.   # DISPOSITION: # follow up in 2 months- MD; port-labs- cbc/cmp- Dr.B  # I reviewed the blood work- with the patient in detail; also reviewed the imaging independently [as summarized above]; and with the patient in detail.   # 40 minutes face-to-face with the patient discussing the above  plan of care; more than 50% of time spent on prognosis/ natural history; counseling and coordination.

## 2023-05-29 ENCOUNTER — Other Ambulatory Visit: Payer: Self-pay

## 2023-05-29 ENCOUNTER — Ambulatory Visit
Admission: RE | Admit: 2023-05-29 | Discharge: 2023-05-29 | Disposition: A | Discharge status: Home / Self Care | Payer: Medicare Other | Source: Ambulatory Visit | Attending: Radiation Oncology | Admitting: Radiation Oncology

## 2023-05-29 DIAGNOSIS — C155 Malignant neoplasm of lower third of esophagus: Secondary | ICD-10-CM | POA: Diagnosis not present

## 2023-05-29 LAB — RAD ONC ARIA SESSION SUMMARY
Course Elapsed Days: 1
Plan Fractions Treated to Date: 2
Plan Prescribed Dose Per Fraction: 2 Gy
Plan Total Fractions Prescribed: 25
Plan Total Prescribed Dose: 50 Gy
Reference Point Dosage Given to Date: 4 Gy
Reference Point Session Dosage Given: 2 Gy
Session Number: 2

## 2023-05-30 ENCOUNTER — Telehealth: Payer: Self-pay

## 2023-05-30 ENCOUNTER — Other Ambulatory Visit: Payer: Self-pay

## 2023-05-30 ENCOUNTER — Ambulatory Visit
Admission: RE | Admit: 2023-05-30 | Discharge: 2023-05-30 | Disposition: A | Discharge status: Home / Self Care | Payer: Medicare Other | Source: Ambulatory Visit | Attending: Radiation Oncology | Admitting: Radiation Oncology

## 2023-05-30 ENCOUNTER — Telehealth: Payer: Self-pay | Admitting: Radiation Oncology

## 2023-05-30 ENCOUNTER — Ambulatory Visit: Payer: Medicare Other | Admitting: Physician Assistant

## 2023-05-30 ENCOUNTER — Inpatient Hospital Stay: Payer: Medicare Other

## 2023-05-30 ENCOUNTER — Encounter: Payer: Self-pay | Admitting: Physician Assistant

## 2023-05-30 DIAGNOSIS — C155 Malignant neoplasm of lower third of esophagus: Secondary | ICD-10-CM | POA: Diagnosis not present

## 2023-05-30 DIAGNOSIS — N2889 Other specified disorders of kidney and ureter: Secondary | ICD-10-CM

## 2023-05-30 DIAGNOSIS — R339 Retention of urine, unspecified: Secondary | ICD-10-CM

## 2023-05-30 LAB — RAD ONC ARIA SESSION SUMMARY
Course Elapsed Days: 2
Plan Fractions Treated to Date: 3
Plan Prescribed Dose Per Fraction: 2 Gy
Plan Total Fractions Prescribed: 25
Plan Total Prescribed Dose: 50 Gy
Reference Point Dosage Given to Date: 6 Gy
Reference Point Session Dosage Given: 2 Gy
Session Number: 3

## 2023-05-30 NOTE — Telephone Encounter (Signed)
This patients wife called and left a voicemail- she stated she forgot to tell you that he has a feeding tube and not supposed to be eating by mouth because of cancer in esophagus- he is insisting on eating. She is asking if it is ok for him to eat or drink. She states he has 6 bottles of drink a day and he is a big man. She doesn't think that is enough for him.   Wife is also requesting a call from [facility] to help her with this.   [phone]

## 2023-05-30 NOTE — Telephone Encounter (Signed)
Pt's wife called, states that when pt was at the cancer center his foley catheter came out, they took him to the ER and they placed a new cath in. [facility] has been taking care of pt catheter but won't take the catheter the ER placed in without an order. Pt's wife also states pt is having UTI symptoms and catheter is now leaking.pt was added on to a PA's schedule to take a look at the catheter.

## 2023-05-30 NOTE — Progress Notes (Addendum)
Nutrition Assessment   Reason for Assessment:  Tube feeding question/message left    ASSESSMENT:  68 year old male with esophageal cancer. Past medical history of COPD/CHF, bilateral BKA, MI, chronic UTI, CAD, DM, HLD.  Patient receiving radiation only.  PEG placed in November 2024  Called wife regarding tube feeding and whether or not patient can eat orally.    Wife reports that patient has been taking 6 cartons of glucerna 1.5 ( 1 carton 6 times a day mostly, sometimes will double up on cartons).  Flush with at least 1 syringe of water before and sometimes 1-2 after.  Gives patient 6-7 oz 1-2 times a day between feedings.  Patient has been taking liquids by mouth (egg drop soup, slice cheese).  Patient reports feeling hungry.  Wife worried that he is not getting enough nutrition via tube feeding  Noted during admission evaluated by SLP on 12/13 and oral intake deferred to GI or Palliative Care.   Medications: reviewed   Labs: glucose 189 on 1/22   Anthropometrics:   Height: 65 inches Weight: 262 lb on 1/22 272 lb 7.8 oz on 12/7 274 lb on 11/8 BMI: 43  4% weight loss in the last 2 1/2 months   Estimated Energy Needs  Kcals: 5409-8119 Protein: 130-148 g Fluid: 2600 ml   NUTRITION DIAGNOSIS: Inadequate oral intake related to cancer as evidenced by feeding tube placed and relying on for nutrition    INTERVENTION:  Dr Rushie Chestnut agreed via secure chat with oral intake of liquids at this time.  Discussed with wife and patient for liquids only as tolerated.  Must be sitting up right while drinking liquids, take small sips. If unable to tolerate discontinue Recommend patient try low sugar oral nutrition supplement shake (premier protein, ensure max protein, boost max protein, etc) at least 1 per day for additional calories and protein (~160 calories and 30 gm protein) Patient has been successful with egg drop soup. We discussed other thin liquids. Continue glucerna 1.5, 6  cartons per day via feeding tube.  Flush with 70ml of water before and after each feeding, 30ml with medication and 6-7 oz water BID between feeding if not able to drink orally.   Tube feeding plus 1 shake will provide 2296 calories, 147 g protein, 2280 ml.    MONITORING, EVALUATION, GOAL: weight trends, intake   Next Visit: Tuesday, Feb 6 after radiation  Araiyah Cumpton B. Freida Busman, RD, LDN Registered Dietitian [phone]

## 2023-05-30 NOTE — Progress Notes (Signed)
05/30/2023 1:34 PM   [name] Aug 07, 1955 [id number]  CC: Chief Complaint  Patient presents with   Urinary Retention   HPI: [name]. is a 68 y.o. male with PMH chronic right perinephric hematoma, esophageal cancer undergoing IMRT with G-tube, and recurrent urinary retention, possibly chronic, who presents today for follow-up.   He was admitted in November with AKI and C. difficile.  There was an incidental finding of interval evolution/enlargement of his chronic right perinephric hematoma and a possible right lower pole renal mass.  He was found to have about a liter of urine in his bladder and Foley was placed for possibly chronic urinary retention in light of his AKI.  [facility] was [facility] at the beginning of the month, but has since returned home.  He is accompanied today by his wife via telephone.  Today they report a desire to pursue voiding trial.  Patient's sister-in-law is a nurse and may be able to remove Foley; wife does not feel comfortable doing so.  Transportation to clinic is a challenge, but they can come before or after a previously scheduled radiation treatment.  He receives nutrition via G-tube feeds.  PMH: Past Medical History:  Diagnosis Date   Arrhythmia    atrial fibrillation   Asthma    CHF (congestive heart failure) (HCC)    COPD (chronic obstructive pulmonary disease) (HCC)    Coronary artery disease    Depression    Diabetes mellitus without complication (HCC)    Gout    History anabolic steroid use    Hyperlipidemia    Hypertension    Hypogonadism in male    MI (myocardial infarction) (HCC)    Morbid obesity (HCC)    Myocardial infarction (HCC)    Peripheral vascular disease (HCC)    Perirectal abscess    Pleurisy    Sleep apnea    CPAP at night, no oxygen   Varicella     Surgical History: Past Surgical History:  Procedure Laterality Date   ABDOMINAL AORTIC ANEURYSM REPAIR     ACHILLES TENDON  SURGERY Left 01/10/2021   Procedure: ACHILLES LENGTHENING/KIDNER;  Surgeon: Rosetta Posner, DPM;  Location: [facility];  Service: Podiatry;  Laterality: Left;   AMPUTATION Left 10/03/2021   Procedure: AMPUTATION BELOW KNEE;  Surgeon: Renford Dills, MD;  Location: [facility];  Service: Vascular;  Laterality: Left;   AMPUTATION Right 05/24/2022   Procedure: AMPUTATION BELOW KNEE;  Surgeon: Annice Needy, MD;  Location: [facility];  Service: General;  Laterality: Right;   AMPUTATION TOE Right 02/10/2016   Procedure: AMPUTATION TOE 3RD TOE;  Surgeon: Gwyneth Revels, DPM;  Location: [facility];  Service: Podiatry;  Laterality: Right;   AMPUTATION TOE Left 02/24/2020   Procedure: AMPUTATION TOE;  Surgeon: Rosetta Posner, DPM;  Location: [facility];  Service: Podiatry;  Laterality: Left;   APPLICATION OF WOUND VAC Left 02/29/2020   Procedure: APPLICATION OF WOUND VAC;  Surgeon: Rosetta Posner, DPM;  Location: [facility];  Service: Podiatry;  Laterality: Left;   BIOPSY  03/15/2023   Procedure: BIOPSY;  Surgeon: Wyline Mood, MD;  Location: [facility]COPY;  Service: Gastroenterology;;   BIOPSY  04/10/2023   Procedure: BIOPSY;  Surgeon: Toledo, Boykin Nearing, MD;  Location: [facility]OS[facility];  Service: Gastroenterology;;   CARDIAC CATHETERIZATION     CARDIOVERSION [name] 02/13/2022   Procedure: CARDIOVERSION;  Surgeon: Lamar Blinks, MD;  Location: [facility];  Service: Cardiovascular;  Laterality: [name];   CARDIOVERSION  [name] 04/23/2023   Procedure: CARDIOVERSION;  Surgeon: Antonieta Iba, MD;  Location: [facility];  Service: Cardiovascular;  Laterality: [name];   COLONOSCOPY WITH PROPOFOL [name] 11/18/2015   Procedure: COLONOSCOPY WITH PROPOFOL;  Surgeon: Scot Jun, MD;  Location: [facility] ENDOSCOPY;  Service: Endoscopy;  Laterality: [name];   CORONARY ARTERY BYPASS GRAFT     CORONARY STENT INTERVENTION [name] 02/02/2020   Procedure: CORONARY STENT INTERVENTION;  Surgeon: Marcina Millard, MD;  Location: [facility];   Service: Cardiovascular;  Laterality: [name];   ESOPHAGOGASTRODUODENOSCOPY (EGD) WITH PROPOFOL [name] 03/15/2023   Procedure: ESOPHAGOGASTRODUODENOSCOPY (EGD) WITH PROPOFOL;  Surgeon: Wyline Mood, MD;  Location[facility]OSCOPY;  Service: Gastroenterology;  Laterality: [name];   FLEXIBLE SIGMOIDOSCOPY [name] 04/10/2023   Procedure: FLEXIBLE SIGMOIDOSCOPY;  Surgeon: Toledo, Boykin Nearing, MD;  Location: [facility];  Service: Gastroenterology;  Laterality: [name];   GASTROSTOMY [name] 03/18/2023   Procedure: INSERTION OF GASTROSTOMY TUBE;  Surgeon: Carolan Shiver, MD;  Location: [facility];  Service: General;  Laterality: [name];   IMPACTION REMOVAL  03/15/2023   Procedure: IMPACTION REMOVAL;  Surgeon: Wyline Mood, MD;  Location: [facility] ENDOSCOPY;  Service: Gastroenterology;;   INCISION AND DRAINAGE Left 08/07/2021   Procedure: INCISION AND DRAINAGE-Partial Calcanectomy;  Surgeon: Rosetta Posner, DPM;  Location: [facility];  Service: Podiatry;  Laterality: Left;   IRRIGATION AND DEBRIDEMENT FOOT Left 02/29/2020   Procedure: IRRIGATION AND DEBRIDEMENT FOOT;  Surgeon: Rosetta Posner, DPM;  Location: [facility];  Service: Podiatry;  Laterality: Left;   IRRIGATION AND DEBRIDEMENT FOOT Left 02/24/2020   Procedure: IRRIGATION AND DEBRIDEMENT FOOT;  Surgeon: Rosetta Posner, DPM;  Location: [facility];  Service: Podiatry;  Laterality: Left;   KNEE ARTHROSCOPY     LEFT HEART CATH AND CORS/GRAFTS ANGIOGRAPHY [name] 02/02/2020   Procedure: LEFT HEART CATH AND CORS/GRAFTS ANGIOGRAPHY;  Surgeon: Dalia Heading, MD;  Location: [facility];  Service: Cardiovascular;  Laterality: [name];   LOWER EXTREMITY ANGIOGRAPHY Left 02/25/2020   Procedure: Lower Extremity Angiography;  Surgeon: Annice Needy, MD;  Location: [facility];  Service: Cardiovascular;  Laterality: Left;   LOWER EXTREMITY ANGIOGRAPHY Left 01/04/2021   Procedure: LOWER EXTREMITY ANGIOGRAPHY;  Surgeon: Annice Needy, MD;  Location: [facility];  Service:  Cardiovascular;  Laterality: Left;   LOWER EXTREMITY ANGIOGRAPHY Right 05/21/2022   Procedure: Lower Extremity Angiography;  Surgeon: Annice Needy, MD;  Location: [facility];  Service: Cardiovascular;  Laterality: Right;   METATARSAL HEAD EXCISION Left 01/10/2021   Procedure: METATARSAL HEAD EXCISION - LEFT 5th;  Surgeon: Rosetta Posner, DPM;  Location: [facility];  Service: Podiatry;  Laterality: Left;   PERIPHERAL VASCULAR CATHETERIZATION Right 01/24/2016   Procedure: Lower Extremity Angiography;  Surgeon: Renford Dills, MD;  Location: [facility];  Service: Cardiovascular;  Laterality: Right;   PERIPHERAL VASCULAR CATHETERIZATION Right 01/25/2016   Procedure: Lower Extremity Angiography;  Surgeon: Renford Dills, MD;  Location: [facility];  Service: Cardiovascular;  Laterality: Right;   PORTACATH PLACEMENT [name] 03/18/2023   Procedure: INSERTION PORT-A-CATH;  Surgeon: Carolan Shiver, MD;  Location: [facility];  Service: General;  Laterality: [name];   TOE AMPUTATION     TONSILLECTOMY      Home Medications:  Allergies as of 05/30/2023       Reactions   Penicillins Other (See Comments)   Happened at 68 years old and pt. stated he passed out  He has tolerated amoxicillin/clavulanate and ampicillin/sulbactam   Statins  Other reaction(s): Muscle Pain Causes legs to ache per pt   Metformin And Related Diarrhea        Medication List        Accurate as of May 30, 2023  1:34 PM. If you have any questions, ask your nurse or doctor.          acetaminophen 325 MG tablet Commonly known as: TYLENOL Place 2 tablets (650 mg total) into feeding tube every 6 (six) hours as needed for mild pain (pain score 1-3) (or Fever >/= 101).   amiodarone 200 MG tablet Commonly known as: PACERONE Take twice a day through 1/7 and then once daily   apixaban 5 MG Tabs tablet Commonly known as: Eliquis Place 1 tablet (5 mg total) into feeding tube 2 (two) times  daily.   ascorbic acid 500 MG tablet Commonly known as: VITAMIN C Place 1 tablet (500 mg total) into feeding tube daily.   Cholecalciferol 25 MCG (1000 UT) tablet Place 1 tablet (1,000 Units total) into feeding tube daily. What changed: how much to take   clonazePAM 0.5 MG tablet Commonly known as: KLONOPIN Place 1 tablet (0.5 mg total) into feeding tube 2 (two) times daily.   Dermacloud Oint Apply 1 Application topically daily as needed.   diphenoxylate-atropine 2.5-0.025 MG tablet Commonly known as: LOMOTIL Place 1 tablet into feeding tube 4 (four) times daily.   ezetimibe 10 MG tablet Commonly known as: ZETIA Place 1 tablet (10 mg total) into feeding tube at bedtime.   feeding supplement (GLUCERNA 1.5 CAL) Liqd Place 237 mLs into feeding tube 6 (six) times daily.   ferrous sulfate 300 (60 Fe) MG/5ML syrup Place 5 mLs (300 mg total) into feeding tube daily. What changed: how much to take   fiber supplement (BANATROL TF) liquid Place 60 mLs into feeding tube 2 (two) times daily.   finasteride 5 MG tablet Commonly known as: PROSCAR Take 1 tablet by mouth daily.   free water Soln Place 30 mLs into feeding tube 6 (six) times daily.   gabapentin 300 MG capsule Commonly known as: NEURONTIN Take 1 capsule by mouth at bedtime.   HumaLOG 100 UNIT/ML injection Generic drug: insulin lispro Inject into the skin.   Lantus 100 UNIT/ML injection Generic drug: insulin glargine Inject into the skin.   levothyroxine 25 MCG tablet Commonly known as: SYNTHROID Place 1 tablet (25 mcg total) into feeding tube daily at 6 (six) AM.   lidocaine-prilocaine cream Commonly known as: EMLA Apply 1 Application topically as needed.   loperamide HCl 1 MG/7.5ML suspension Commonly known as: IMODIUM Place 15 mLs (2 mg total) into feeding tube every 6 (six) hours as needed for diarrhea or loose stools.   metoprolol tartrate 25 MG tablet Commonly known as: LOPRESSOR Take 0.5 tablets  (12.5 mg total) by mouth 2 (two) times daily.   multivitamin with minerals tablet Take 1 tablet by mouth daily.   nitroGLYCERIN 0.4 MG SL tablet Commonly known as: NITROSTAT Place 1 tablet (0.4 mg total) under the tongue every 5 (five) minutes as needed for chest pain.   nystatin powder Commonly known as: MYCOSTATIN/NYSTOP Apply 1 Application topically 2 (two) times daily. Under belly   ondansetron 4 MG tablet Commonly known as: ZOFRAN Place 1 tablet (4 mg total) into feeding tube every 8 (eight) hours as needed for nausea.   pramipexole 1 MG tablet Commonly known as: MIRAPEX Place 2 tablets (2 mg total) into feeding tube at bedtime.   primidone 250 MG  tablet Commonly known as: MYSOLINE Place 1 tablet (250 mg total) into feeding tube 2 (two) times daily.   tamsulosin 0.4 MG Caps capsule Commonly known as: FLOMAX Take 0.4 mg by mouth.   vitamin A & D ointment Apply 1 Application topically as needed for dry skin. Apply to stumps   Vitamin B-12 5000 MCG Tbdp Place 10,000 mcg into feeding tube daily.        Allergies:  Allergies  Allergen Reactions   Penicillins Other (See Comments)    Happened at 68 years old and pt. stated he passed out  He has tolerated amoxicillin/clavulanate and ampicillin/sulbactam   Statins     Other reaction(s): Muscle Pain Causes legs to ache per pt   Metformin And Related Diarrhea    Family History: Family History  Problem Relation Age of Onset   Hypertension Father    Coronary artery disease Father    Alcohol abuse Father    Heart failure Brother     Social History:   reports that he quit smoking about 8 years ago. His smoking use included cigarettes. He started smoking about 53 years ago. He has a 22.5 pack-year smoking history. He has never used smokeless tobacco. He reports that he does not currently use alcohol after a past usage of about 3.0 standard drinks of alcohol per week. He reports that he does not use drugs.  Physical  Exam: There were no vitals taken for this visit.  Constitutional:  Alert and oriented, no acute distress, nontoxic appearing HEENT: [place], AT Cardiovascular: No clubbing, cyanosis, or edema Respiratory: Normal respiratory effort, no increased work of breathing Skin: No rashes, bruises or suspicious lesions Neurologic: Grossly intact, no focal deficits, moving all 4 extremities Psychiatric: Normal mood and affect  Assessment & Plan:   1. Urinary retention (Primary) Due for outpatient voiding trial.  After lengthy conversation, we elected to see if his sister-in-law can come remove his Foley catheter on Monday night and come see me the following morning prior to his scheduled radiation treatment for a bladder scan.  2. Right renal mass He will ultimately require MRI for further evaluation, will defer in light of #1 for now.  Return in 5 days (on 06/04/2023) for Voiding trial-PVR only.  Carman Ching, PA-C  [facility] [place] [address], [address] [place], [place] [address] [phone]

## 2023-05-31 ENCOUNTER — Ambulatory Visit
Admission: RE | Admit: 2023-05-31 | Discharge: 2023-05-31 | Disposition: A | Discharge status: Home / Self Care | Payer: Medicare Other | Source: Ambulatory Visit | Attending: Radiation Oncology | Admitting: Radiation Oncology

## 2023-05-31 ENCOUNTER — Other Ambulatory Visit: Payer: Self-pay

## 2023-05-31 DIAGNOSIS — C155 Malignant neoplasm of lower third of esophagus: Secondary | ICD-10-CM | POA: Diagnosis not present

## 2023-05-31 LAB — RAD ONC ARIA SESSION SUMMARY
Course Elapsed Days: 3
Plan Fractions Treated to Date: 4
Plan Prescribed Dose Per Fraction: 2 Gy
Plan Total Fractions Prescribed: 25
Plan Total Prescribed Dose: 50 Gy
Reference Point Dosage Given to Date: 8 Gy
Reference Point Session Dosage Given: 2 Gy
Session Number: 4

## 2023-06-01 NOTE — Progress Notes (Unsigned)
Cardiology Clinic Note   Date: 06/03/2023 ID: [name]., DOB Jan 08, 1956, MRN [id number]  Primary Cardiologist:  Lorine Bears, MD  Chief Complaint   [name]. is a 68 y.o. male who presents to the clinic today for hospital follow-up.  Patient Profile   [name]. is followed by Dr. Kirke Corin for the history outlined below.      Past medical history significant for: CAD. S/p CABG x 2 2006: LIMA to LAD, SVG to OM1. LHC 2021 (abnormal stress test) performed at outside facility: Patent LIMA to LAD.  Occluded SVG to OM.  Mid RCA 89%.  PCI with DES to RCA. PAF/a-flutter. DCCV 02/13/2022. DCCV 04/23/2023. Chronic diastolic heart failure. Echo 03/17/2023: EF 55%.  Mild hypokinesis of the basal inferior wall.  Mild LVH.  Grade I DD.  Normal RV size/function.  Aortic valve sclerosis/calcification without stenosis. PAD. Left BKA 2023. Right BKA 05/21/2022. Hypertension. Hyperlipidemia. Lipid panel 02/22/2023: LDL 93, HDL 43, TG 71, total 150. COPD. OSA. Esophageal cancer. T2DM. Hypothyroidism. Osteomyelitis. Frequent falls.  In summary, patient has a history of CAD s/p CABG x 2 performed at an outside facility in 2006.  LHC in 2021 performed secondary to abnormal stress test as detailed above.  Patient is followed by vascular surgery for PAD and underwent left BKA in 2023 and right BKA in January 2024.  He has a history of PAF/a-flutter on long-term anticoagulation.  He has a history of esophageal cancer and underwent chemotherapy.  He had a PEG tube placed in November 2024.  Patient was last seen in the office by Dr. Kirke Corin on 11/20/2022 for routine follow-up.  He had no cardiac complaints at that time.  No medication changes were made.  Patient underwent hospital admission from 03/14/2023 to 03/22/2023 for dysphagia and was found to have food impaction secondary to esophageal mass.  He underwent PEG tube placement on 03/18/2023.  He presented to the  ED on 03/27/2023 with profuse diarrhea.  He was admitted with dehydration and hyperkalemia in the setting of diarrhea from C. difficile and possible right perinephric abscess.  Cardiology was consulted on 04/04/2023 for management of a-flutter with declining BP.  EKG on 04/03/2023 showed a-flutter with 2-1 AV block. There was concern patient was not getting full effect of amiodarone and diltiazem as they were being crushed and administered through his g-tube. It was felt aflutter was being driven by physiologic stress in the setting of cdiff and dehydration.  Initial plan including transitioning patient to IV amiodarone for rate control and pharmacologic cardioversion but patient at high risk for stroke secondary to interruption of anticoagulation from 11/28/-11/30. Not a candidate for TEE secondary to obstructing esophogeal tumor. Patient experienced delirium during hospital admission pulling off his monitoring equipment and disconnecting IV. Continued difficulty with controlling rate. IV digoxin x 2 given with no significant improvement in rate. Concern for GI absorption of medications secondary to continued diarrhea. Patient was restarted on po amiodarone 12/16 in preparation for cardioversion on 12/23. Cardioversion on 12/23 was canceled in the setting of increased confusion. He returned to baseline mental status the following day and underwent cardioversion. His hospital course was further complicated by urinary retention and he Foley was placed. Patient was discharged on 05/03/2023 to [facility].  Patient presented to the ED on 05/22/2023 for central chest pain. EKG showed sinus rhythm with no acute changes. Labs were unremarkable including negative troponin x 2. He was discharged the next day.  History of Present Illness    Today, patient is accompanied by his wife.  He reports that he is overall doing well from a cardiac standpoint. Patient denies shortness of breath, dyspnea on exertion,   orthopnea or PND. No chest pain, pressure, or tightness since ED workup on 1/22. No palpitations.  He is tolerating his medications well. He has not noted blood in stool or urine. He is undergoing radiation for esophageal cancer is very fatigued. He has a foley catheter in place. His wife is very concerned that his urine looks very cloudy. They are encouraged to contact urology.      ROS: All other systems reviewed and are otherwise negative except as noted in History of Present Illness.  EKGs/Labs Reviewed    EKG Interpretation Date/Time:  Monday June 03 2023 11:43:52 EST Ventricular Rate:  79 PR Interval:  152 QRS Duration:  152 QT Interval:  382 QTC Calculation: 438 R Axis:   -65  Text Interpretation: Normal sinus rhythm Right bundle branch block Left anterior fascicular block Bifascicular block When compared with ECG of 22-May-2023 18:32, Premature ventricular complexes are no longer Present Borderline criteria for Lateral infarct are no longer Present Confirmed by Carlos Levering [phone]) on 06/03/2023 11:48:38 AM   04/17/2023: ALT 34; AST 17 05/22/2023: BUN 30; Creatinine, Ser 0.97; Potassium 4.7; Sodium 137   06/03/2023: Hemoglobin 9.2; WBC Count 11.2    Risk Assessment/Calculations     CHA2DS2-VASc Score = 5   This indicates a 7.2% annual risk of stroke. The patient's score is based upon: CHF History: 1 HTN History: 1 Diabetes History: 1 Stroke History: 0 Vascular Disease History: 1 Age Score: 1 Gender Score: 0             Physical Exam    VS:  BP (!) 90/40   Pulse 79   Ht 5\' 5"  (1.651 m)   Wt 240 lb (108.9 kg)   SpO2 91%   BMI 39.94 kg/m  , BMI Body mass index is 39.94 kg/m.  GEN: Well nourished, well developed, in no acute distress. Neck: No JVD or carotid bruits. Cardiac:  RRR. No murmurs. No rubs or gallops.   Respiratory:  Respirations regular and unlabored. Clear to auscultation without rales, wheezing or rhonchi. GI: Soft, nontender,  nondistended. Extremities: Radials/DP/PT 2+ and equal bilaterally. No clubbing or cyanosis. Bilateral BKA.   Skin: Warm and dry, no rash. Neuro: Strength intact.  Assessment & Plan   CAD S/p CABG x 2 2006, PCI with DES to RCA 2021. SVG to OM is occluded. Patient denies chest pain, pressure or tightness since unremarkable ED workup on 1/22.  -Continue metoprolol, Zetia, as needed SL NTG.  Not on aspirin secondary to Eliquis.  PAF/a-flutter DCCV October 2023 and December 2024.  Patient denies palpitations.  Denies spontaneous bleeding concerns.  EKG shows sinus rhythm with RBBB and LAFB, 79 bpm.  -Continue amiodarone, metoprolol, Eliquis. -Refer to EP.  Chronic diastolic heart failure Echo November 2024 showed EF 55%, mild hypokinesis of the basal inferior wall, mild LVH, Grade I DD.  Patient denies dyspnea, orthopnea or PND. Euvolemic and well compensated on exam. -Continue metoprolol. GDMT limited by soft BP.   PAD S/p bilateral BKA (left 2023, right 2024). Prior to starting radiation but was able to transfer with a slide board. He has been very fatigued and weak since radiation and is now transferring with a hoyer lift.  -Continue to follow with vascular surgery.  Hypertension BP today 90/40 on intake  and 100/50 on my recheck. He denies lightheadedness or dizziness.  -Continue metoprolol.  Hyperlipidemia/statin intolerance LDL October 2024 93, not at goal.  Statins have caused myalgias in the past -Continue Zetia.  Disposition: Refer to EP. Return in 3-4 months to Dr. Kirke Corin or sooner as needed.          Signed, Etta Grandchild. Creig Landin, DNP, NP-C

## 2023-06-03 ENCOUNTER — Inpatient Hospital Stay: Payer: Medicare Other | Attending: Internal Medicine

## 2023-06-03 ENCOUNTER — Ambulatory Visit: Payer: Medicare Other | Attending: Student | Admitting: Student

## 2023-06-03 ENCOUNTER — Ambulatory Visit: Payer: Medicare Other | Admitting: Physician Assistant

## 2023-06-03 ENCOUNTER — Other Ambulatory Visit: Payer: Self-pay

## 2023-06-03 ENCOUNTER — Encounter: Payer: Self-pay | Admitting: Student

## 2023-06-03 ENCOUNTER — Ambulatory Visit
Admission: RE | Admit: 2023-06-03 | Discharge: 2023-06-03 | Disposition: A | Discharge status: Home / Self Care | Payer: Medicare Other | Source: Ambulatory Visit | Attending: Radiation Oncology | Admitting: Radiation Oncology

## 2023-06-03 VITALS — BP 100/58 | HR 73 | Temp 97.6°F

## 2023-06-03 VITALS — BP 90/40 | HR 79 | Ht 65.0 in | Wt 240.0 lb

## 2023-06-03 DIAGNOSIS — Z510 Encounter for antineoplastic radiation therapy: Secondary | ICD-10-CM | POA: Insufficient documentation

## 2023-06-03 DIAGNOSIS — Z87891 Personal history of nicotine dependence: Secondary | ICD-10-CM | POA: Diagnosis not present

## 2023-06-03 DIAGNOSIS — E785 Hyperlipidemia, unspecified: Secondary | ICD-10-CM

## 2023-06-03 DIAGNOSIS — C155 Malignant neoplasm of lower third of esophagus: Secondary | ICD-10-CM | POA: Diagnosis present

## 2023-06-03 DIAGNOSIS — R82998 Other abnormal findings in urine: Secondary | ICD-10-CM | POA: Diagnosis not present

## 2023-06-03 DIAGNOSIS — I10 Essential (primary) hypertension: Secondary | ICD-10-CM

## 2023-06-03 DIAGNOSIS — I480 Paroxysmal atrial fibrillation: Secondary | ICD-10-CM

## 2023-06-03 DIAGNOSIS — I2510 Atherosclerotic heart disease of native coronary artery without angina pectoris: Secondary | ICD-10-CM | POA: Diagnosis not present

## 2023-06-03 DIAGNOSIS — R3989 Other symptoms and signs involving the genitourinary system: Secondary | ICD-10-CM

## 2023-06-03 DIAGNOSIS — S3730XA Unspecified injury of urethra, initial encounter: Secondary | ICD-10-CM

## 2023-06-03 DIAGNOSIS — I739 Peripheral vascular disease, unspecified: Secondary | ICD-10-CM

## 2023-06-03 DIAGNOSIS — T466X5A Adverse effect of antihyperlipidemic and antiarteriosclerotic drugs, initial encounter: Secondary | ICD-10-CM

## 2023-06-03 DIAGNOSIS — I5032 Chronic diastolic (congestive) heart failure: Secondary | ICD-10-CM

## 2023-06-03 DIAGNOSIS — N3000 Acute cystitis without hematuria: Secondary | ICD-10-CM

## 2023-06-03 DIAGNOSIS — I4892 Unspecified atrial flutter: Secondary | ICD-10-CM

## 2023-06-03 DIAGNOSIS — M791 Myalgia, unspecified site: Secondary | ICD-10-CM

## 2023-06-03 DIAGNOSIS — R339 Retention of urine, unspecified: Secondary | ICD-10-CM | POA: Diagnosis not present

## 2023-06-03 LAB — CBC (CANCER CENTER ONLY)
HCT: 29.7 % — ABNORMAL LOW (ref 39.0–52.0)
Hemoglobin: 9.2 g/dL — ABNORMAL LOW (ref 13.0–17.0)
MCH: 29.7 pg (ref 26.0–34.0)
MCHC: 31 g/dL (ref 30.0–36.0)
MCV: 95.8 fL (ref 80.0–100.0)
Platelet Count: 275 10*3/uL (ref 150–400)
RBC: 3.1 MIL/uL — ABNORMAL LOW (ref 4.22–5.81)
RDW: 16.6 % — ABNORMAL HIGH (ref 11.5–15.5)
WBC Count: 11.2 10*3/uL — ABNORMAL HIGH (ref 4.0–10.5)
nRBC: 0 % (ref 0.0–0.2)

## 2023-06-03 LAB — RAD ONC ARIA SESSION SUMMARY
Course Elapsed Days: 6
Plan Fractions Treated to Date: 5
Plan Prescribed Dose Per Fraction: 2 Gy
Plan Total Fractions Prescribed: 25
Plan Total Prescribed Dose: 50 Gy
Reference Point Dosage Given to Date: 10 Gy
Reference Point Session Dosage Given: 2 Gy
Session Number: 5

## 2023-06-03 LAB — URINALYSIS, COMPLETE
Bilirubin, UA: NEGATIVE
Glucose, UA: NEGATIVE
Ketones, UA: NEGATIVE
Nitrite, UA: NEGATIVE
Specific Gravity, UA: 1.015 (ref 1.005–1.030)
Urobilinogen, Ur: 0.2 mg/dL (ref 0.2–1.0)
pH, UA: 6 (ref 5.0–7.5)

## 2023-06-03 LAB — MICROSCOPIC EXAMINATION: WBC, UA: >30 /[HPF] — AB (ref 0–5)

## 2023-06-03 MED ORDER — CEFTRIAXONE SODIUM 1 G IJ SOLR
1.0000 g | Freq: Once | INTRAMUSCULAR | Status: AC
  Administered 2023-06-03: 1 g via INTRAMUSCULAR

## 2023-06-03 MED ORDER — CEFDINIR 300 MG PO CAPS: 300.0000 mg | ORAL_CAPSULE | Freq: Two times a day (BID) | ORAL | 0 refills | Status: AC

## 2023-06-03 NOTE — Patient Instructions (Signed)
Medication Instructions:  - Your physician recommends that you continue on your current medications as directed. Please refer to the Current Medication list given to you today.  *If you need a refill on your cardiac medications before your next appointment, please call your pharmacy*   Lab Work: - none ordered  If you have labs (blood work) drawn today and your tests are completely normal, you will receive your results only by: MyChart Message (if you have MyChart) OR A paper copy in the mail If you have any lab test that is abnormal or we need to change your treatment, we will call you to review the results.   Testing/Procedures: - You have been referred to : Cardiac Electrophysiology- Dr. Lalla Brothers Dr. Jimmey Ralph Management of Atrial Fibrillation     Follow-Up: At [facility], you and your health needs are our priority.  As part of our continuing mission to provide you with exceptional heart care, we have created designated Provider Care Teams.  These Care Teams include your primary Cardiologist (physician) and Advanced Practice Providers (APPs -  Physician Assistants and Nurse Practitioners) who all work together to provide you with the care you need, when you need it.  We recommend signing up for the patient portal called "MyChart".  Sign up information is provided on this After Visit Summary.  MyChart is used to connect with patients for Virtual Visits (Telemedicine).  Patients are able to view lab/test results, encounter notes, upcoming appointments, etc.  Non-urgent messages can be sent to your provider as well.   To learn more about what you can do with MyChart, go to [email or web address].    Your next appointment:   3-4  month(s)  Provider:   Lorine Bears, MD    Other Instructions [name]

## 2023-06-03 NOTE — Progress Notes (Signed)
06/03/2023 2:41 PM   [name]. [birth year] [id number]  CC: Chief Complaint  Patient presents with   abnormal urine   HPI: [name]. is a 68 y.o. male with PMH chronic right perinephric hematoma, esophageal cancer undergoing IMRT with G-tube, and recurrent urinary retention, possibly chronic, who presents today for evaluation of possible UTI.  He is scheduled for voiding trial with me in 1 week.  He is accompanied today by his wife, who contributes to HPI.   Today he reports bladder burning discomfort.  His wife states he has been more confused.  She is concerned about the milky appearance of his urine.  Foley catheter in place draining intermittently milky but otherwise yellow urine.  PMH: Past Medical History:  Diagnosis Date   Arrhythmia    atrial fibrillation   Asthma    CHF (congestive heart failure) (HCC)    COPD (chronic obstructive pulmonary disease) (HCC)    Coronary artery disease    Depression    Diabetes mellitus without complication (HCC)    Gout    History anabolic steroid use    Hyperlipidemia    Hypertension    Hypogonadism in male    MI (myocardial infarction) (HCC)    Morbid obesity (HCC)    Myocardial infarction (HCC)    Peripheral vascular disease (HCC)    Perirectal abscess    Pleurisy    Sleep apnea    CPAP at night, no oxygen   Varicella     Surgical History: Past Surgical History:  Procedure Laterality Date   ABDOMINAL AORTIC ANEURYSM REPAIR     ACHILLES TENDON SURGERY Left 01/10/2021   Procedure: ACHILLES LENGTHENING/KIDNER;  Surgeon: Rosetta Posner, DPM;  Location: [facility];  Service: Podiatry;  Laterality: Left;   AMPUTATION Left 10/03/2021   Procedure: AMPUTATION BELOW KNEE;  Surgeon: Renford Dills, MD;  Location: [facility];  Service: Vascular;  Laterality: Left;   AMPUTATION Right 05/24/2022   Procedure: AMPUTATION BELOW KNEE;  Surgeon: Annice Needy, MD;  Location: [facility];  Service: General;  Laterality:  Right;   AMPUTATION TOE Right 02/10/2016   Procedure: AMPUTATION TOE 3RD TOE;  Surgeon: Gwyneth Revels, DPM;  Location: [facility];  Service: Podiatry;  Laterality: Right;   AMPUTATION TOE Left 02/24/2020   Procedure: AMPUTATION TOE;  Surgeon: Rosetta Posner, DPM;  Location: [facility];  Service: Podiatry;  Laterality: Left;   APPLICATION OF WOUND VAC Left 02/29/2020   Procedure: APPLICATION OF WOUND VAC;  Surgeon: Rosetta Posner, DPM;  Location: [facility];  Service: Podiatry;  Laterality: Left;   BIOPSY  03/15/2023   Procedure: BIOPSY;  Surgeon: Wyline Mood, MD;  Location: [facility]OSCOPY;  Service: Gastroenterology;;   BIOPSY  04/10/2023   Procedure: BIOPSY;  Surgeon: Norma Fredrickson, Boykin Nearing, MD;  Location: [facility] ENDOSCOPY;  Service: Gastroenterology;;   CARDIAC CATHETERIZATION     CARDIOVERSION [name] 02/13/2022   Procedure: CARDIOVERSION;  Surgeon: Lamar Blinks, MD;  Location: [facility];  Service: Cardiovascular;  Laterality: [name];   CARDIOVERSION [name] 04/23/2023   Procedure: CARDIOVERSION;  Surgeon: Antonieta Iba, MD;  Location: [facility];  Service: Cardiovascular;  Laterality: [name];   COLONOSCOPY WITH PROPOFOL [name] 11/18/2015   Procedure: COLONOSCOPY WITH PROPOFOL;  Surgeon: Scot Jun, MD;  Location: [facility] ENDOSCOPY;  Service: Endoscopy;  Laterality: [name];   CORONARY ARTERY BYPASS GRAFT     CORONARY STENT INTERVENTION [name] 02/02/2020   Procedure: CORONARY STENT INTERVENTION;  Surgeon: Marcina Millard, MD;  Location: [facility]  INVASIVE CV LAB;  Service: Cardiovascular;  Laterality: [name];   ESOPHAGOGASTRODUODENOSCOPY (EGD) WITH PROPOFOL [name] 03/15/2023   Procedure: ESOPHAGOGASTRODUODENOSCOPY (EGD) WITH PROPOFOL;  Surgeon: Wyline Mood, MD;  Location: [facility] ENDOSCOPY;  Service: Gastroenterology;  Laterality: [name];   FLEXIBLE SIGMOIDOSCOPY [name] 04/10/2023   Procedure: FLEXIBLE SIGMOIDOSCOPY;  Surgeon: Toledo, Boykin Nearing, MD;  Location: [facility] ENDOSCOPY;  Service: Gastroenterology;  Laterality: [name];    GASTROSTOMY [name] 03/18/2023   Procedure: INSERTION OF GASTROSTOMY TUBE;  Surgeon: Carolan Shiver, MD;  Location: [facility];  Service: General;  Laterality: [name];   IMPACTION REMOVAL  03/15/2023   Procedure: IMPACTION REMOVAL;  Surgeon: Wyline Mood, MD;  Location[facility] ENDOSCOPY;  Service: Gastroenterology;;   INCISION AND DRAINAGE Left 08/07/2021   Procedure: INCISION AND DRAINAGE-Partial Calcanectomy;  Surgeon: Rosetta Posner, DPM;  Location: [facility];  Service: Podiatry;  Laterality: Left;   IRRIGATION AND DEBRIDEMENT FOOT Left 02/29/2020   Procedure: IRRIGATION AND DEBRIDEMENT FOOT;  Surgeon: Rosetta Posner, DPM;  Location: [facility];  Service: Podiatry;  Laterality: Left;   IRRIGATION AND DEBRIDEMENT FOOT Left 02/24/2020   Procedure: IRRIGATION AND DEBRIDEMENT FOOT;  Surgeon: Rosetta Posner, DPM;  Location: [facility];  Service: Podiatry;  Laterality: Left;   KNEE ARTHROSCOPY     LEFT HEART CATH AND CORS/GRAFTS ANGIOGRAPHY [name] 02/02/2020   Procedure: LEFT HEART CATH AND CORS/GRAFTS ANGIOGRAPHY;  Surgeon: Dalia Heading, MD;  Location: [facility] INVASIVE CV LAB;  Service: Cardiovascular;  Laterality: [name];   LOWER EXTREMITY ANGIOGRAPHY Left 02/25/2020   Procedure: Lower Extremity Angiography;  Surgeon: Annice Needy, MD;  Location: [facility] INVASIVE CV LAB;  Service: Cardiovascular;  Laterality: Left;   LOWER EXTREMITY ANGIOGRAPHY Left 01/04/2021   Procedure: LOWER EXTREMITY ANGIOGRAPHY;  Surgeon: Annice Needy, MD;  Location: [facility] INVASIVE CV LAB;  Service: Cardiovascular;  Laterality: Left;   LOWER EXTREMITY ANGIOGRAPHY Right 05/21/2022   Procedure: Lower Extremity Angiography;  Surgeon: Annice Needy, MD;  Location: [facility] INVASIVE CV LAB;  Service: Cardiovascular;  Laterality: Right;   METATARSAL HEAD EXCISION Left 01/10/2021   Procedure: METATARSAL HEAD EXCISION - LEFT 5th;  Surgeon: Rosetta Posner, DPM;  Location: [facility];  Service: Podiatry;  Laterality: Left;   PERIPHERAL VASCULAR CATHETERIZATION  Right 01/24/2016   Procedure: Lower Extremity Angiography;  Surgeon: Renford Dills, MD;  Location: [facility] INVASIVE CV LAB;  Service: Cardiovascular;  Laterality: Right;   PERIPHERAL VASCULAR CATHETERIZATION Right 01/25/2016   Procedure: Lower Extremity Angiography;  Surgeon: Renford Dills, MD;  Location: [facility] INVASIVE CV LAB;  Service: Cardiovascular;  Laterality: Right;   PORTACATH PLACEMENT [name] 03/18/2023   Procedure: INSERTION PORT-A-CATH;  Surgeon: Carolan Shiver, MD;  Location: [facility];  Service: General;  Laterality: [name];   TOE AMPUTATION     TONSILLECTOMY      Home Medications:  Allergies as of 06/03/2023       Reactions   Penicillins Other (See Comments)   Happened at 69 years old and pt. stated he passed out  He has tolerated amoxicillin/clavulanate and ampicillin/sulbactam   Statins    Other reaction(s): Muscle Pain Causes legs to ache per pt   Metformin And Related Diarrhea        Medication List        Accurate as of June 03, 2023  2:41 PM. If you have any questions, ask your nurse or doctor.          acetaminophen 325 MG tablet Commonly known as: TYLENOL Place 2 tablets (650 mg total) into feeding tube every  6 (six) hours as needed for mild pain (pain score 1-3) (or Fever >/= 101).   amiodarone 200 MG tablet Commonly known as: PACERONE Take twice a day through 1/7 and then once daily   apixaban 5 MG Tabs tablet Commonly known as: Eliquis Place 1 tablet (5 mg total) into feeding tube 2 (two) times daily.   ascorbic acid 500 MG tablet Commonly known as: VITAMIN C Place 1 tablet (500 mg total) into feeding tube daily.   cefdinir 300 MG capsule Commonly known as: OMNICEF Take 1 capsule (300 mg total) by mouth 2 (two) times daily for 7 days. Started by: Carman Ching   Cholecalciferol 25 MCG (1000 UT) tablet Place 1 tablet (1,000 Units total) into feeding tube daily. What changed: how much to take   clonazePAM 0.5 MG  tablet Commonly known as: KLONOPIN Place 1 tablet (0.5 mg total) into feeding tube 2 (two) times daily.   Dermacloud Oint Apply 1 Application topically daily as needed.   diphenoxylate-atropine 2.5-0.025 MG tablet Commonly known as: LOMOTIL Place 1 tablet into feeding tube 4 (four) times daily.   ezetimibe 10 MG tablet Commonly known as: ZETIA Place 1 tablet (10 mg total) into feeding tube at bedtime.   feeding supplement (GLUCERNA 1.5 CAL) Liqd Place 237 mLs into feeding tube 6 (six) times daily.   ferrous sulfate 300 (60 Fe) MG/5ML syrup Place 5 mLs (300 mg total) into feeding tube daily. What changed: how much to take   fiber supplement (BANATROL TF) liquid Place 60 mLs into feeding tube 2 (two) times daily.   finasteride 5 MG tablet Commonly known as: PROSCAR Take 1 tablet by mouth daily.   free water Soln Place 30 mLs into feeding tube 6 (six) times daily.   gabapentin 300 MG capsule Commonly known as: NEURONTIN Take 1 capsule by mouth at bedtime.   HumaLOG 100 UNIT/ML injection Generic drug: insulin lispro Inject into the skin.   Lantus 100 UNIT/ML injection Generic drug: insulin glargine Inject into the skin.   levothyroxine 25 MCG tablet Commonly known as: SYNTHROID Place 1 tablet (25 mcg total) into feeding tube daily at 6 (six) AM.   lidocaine-prilocaine cream Commonly known as: EMLA Apply 1 Application topically as needed.   loperamide HCl 1 MG/7.5ML suspension Commonly known as: IMODIUM Place 15 mLs (2 mg total) into feeding tube every 6 (six) hours as needed for diarrhea or loose stools.   metoprolol tartrate 25 MG tablet Commonly known as: LOPRESSOR Take 0.5 tablets (12.5 mg total) by mouth 2 (two) times daily.   multivitamin with minerals tablet Take 1 tablet by mouth daily.   nitroGLYCERIN 0.4 MG SL tablet Commonly known as: NITROSTAT Place 1 tablet (0.4 mg total) under the tongue every 5 (five) minutes as needed for chest pain.    nystatin powder Commonly known as: MYCOSTATIN/NYSTOP Apply 1 Application topically 2 (two) times daily. Under belly   ondansetron 4 MG tablet Commonly known as: ZOFRAN Place 1 tablet (4 mg total) into feeding tube every 8 (eight) hours as needed for nausea.   pramipexole 1 MG tablet Commonly known as: MIRAPEX Place 2 tablets (2 mg total) into feeding tube at bedtime.   primidone 250 MG tablet Commonly known as: MYSOLINE Place 1 tablet (250 mg total) into feeding tube 2 (two) times daily.   tamsulosin 0.4 MG Caps capsule Commonly known as: FLOMAX Take 0.4 mg by mouth.   vitamin A & D ointment Apply 1 Application topically as needed for dry  skin. Apply to stumps   Vitamin B-12 5000 MCG Tbdp Place 10,000 mcg into feeding tube daily.        Allergies:  Allergies  Allergen Reactions   Penicillins Other (See Comments)    Happened at 68 years old and pt. stated he passed out  He has tolerated amoxicillin/clavulanate and ampicillin/sulbactam   Statins     Other reaction(s): Muscle Pain Causes legs to ache per pt   Metformin And Related Diarrhea    Family History: Family History  Problem Relation Age of Onset   Hypertension Father    Coronary artery disease Father    Alcohol abuse Father    Heart failure Brother     Social History:   reports that he quit smoking about 8 years ago. His smoking use included cigarettes. He started smoking about 53 years ago. He has a 22.5 pack-year smoking history. He has never used smokeless tobacco. He reports that he does not currently use alcohol after a past usage of about 3.0 standard drinks of alcohol per week. He reports that he does not use drugs.  Physical Exam: BP (!) 100/58   Pulse 73   Temp 97.6 F (36.4 C)   Constitutional:  Alert and oriented, no acute distress, nontoxic appearing HEENT: [place], AT Cardiovascular: No clubbing, cyanosis, or edema Respiratory: Normal respiratory effort, no increased work of breathing Skin:  No rashes, bruises or suspicious lesions Neurologic: Grossly intact, no focal deficits, moving all 4 extremities Psychiatric: Normal mood and affect  Cath Change/ Replacement  Patient is present today for a catheter change due to urinary retention.  4ml of water was removed from the balloon, a 14FR silicone foley cath was removed; brisk urethral bleeding noted with balloon deflation.  Patient was cleaned and prepped in a sterile fashion with betadine and two tubes of 2% lidocaine jelly was instilled into the urethra. A 18 FR coude foley cath was replaced into the bladder, no complications were noted. Urine return was noted and urine was amber in color. The balloon was filled with 10ml of sterile water. A night bag was attached for drainage.  Patient tolerated well. Urethral bleeding stopped by the completion of the procedure.    Performed by: Carman Ching, PA-C and Domingo Cocking, CMA  Assessment & Plan:   1. Acute cystitis without hematuria (Primary) UA appeared rather purulent on arrival and with reports of bladder discomfort I elected to exchange his Foley with plans to start empiric antibiotics.  His Foley balloon was inflated with only 4 cc of water and there was brisk urethral bleeding immediately after I deflated the balloon consistent with Foley trauma secondary to inflation within the urethra.  I exchange his catheter as above and immediately drained a significant additional amount of urine from his bladder, which again would be consistent with malpositioned Foley catheter.  We gave him a shot of IM Rocephin and are transitioning to 1 week of p.o. cefdinir.  Will send urine sample from new catheter for UA and culture.  Will keep plans for voiding trial with me next week. - Urinalysis, Complete - CULTURE, URINE COMPREHENSIVE - cefdinir (OMNICEF) 300 MG capsule; Take 1 capsule (300 mg total) by mouth 2 (two) times daily for 7 days.  Dispense: 14 capsule; Refill: 0 -  cefTRIAXone (ROCEPHIN) injection 1 g  2. Trauma of urethra, initial encounter See above.  Urethral bleeding subsided by the end of our visit today.  Return for Keep follow-up as scheduled.  Carman Ching,  PA-C  [facility] [address] [place], [place] [address] [phone]

## 2023-06-04 ENCOUNTER — Ambulatory Visit: Payer: Medicare Other | Admitting: Physician Assistant

## 2023-06-04 ENCOUNTER — Other Ambulatory Visit: Payer: Self-pay

## 2023-06-04 ENCOUNTER — Ambulatory Visit
Admission: RE | Admit: 2023-06-04 | Discharge: 2023-06-04 | Disposition: A | Discharge status: Home / Self Care | Payer: Medicare Other | Source: Ambulatory Visit | Attending: Radiation Oncology | Admitting: Radiation Oncology

## 2023-06-04 DIAGNOSIS — C155 Malignant neoplasm of lower third of esophagus: Secondary | ICD-10-CM | POA: Diagnosis not present

## 2023-06-04 LAB — RAD ONC ARIA SESSION SUMMARY
Course Elapsed Days: 7
Plan Fractions Treated to Date: 6
Plan Prescribed Dose Per Fraction: 2 Gy
Plan Total Fractions Prescribed: 25
Plan Total Prescribed Dose: 50 Gy
Reference Point Dosage Given to Date: 12 Gy
Reference Point Session Dosage Given: 2 Gy
Session Number: 6

## 2023-06-05 ENCOUNTER — Ambulatory Visit
Admission: RE | Admit: 2023-06-05 | Discharge: 2023-06-05 | Disposition: A | Discharge status: Home / Self Care | Payer: Medicare Other | Source: Ambulatory Visit | Attending: Radiation Oncology | Admitting: Radiation Oncology

## 2023-06-05 ENCOUNTER — Other Ambulatory Visit: Payer: Self-pay

## 2023-06-05 DIAGNOSIS — C155 Malignant neoplasm of lower third of esophagus: Secondary | ICD-10-CM | POA: Diagnosis not present

## 2023-06-05 LAB — RAD ONC ARIA SESSION SUMMARY
Course Elapsed Days: 8
Plan Fractions Treated to Date: 7
Plan Prescribed Dose Per Fraction: 2 Gy
Plan Total Fractions Prescribed: 25
Plan Total Prescribed Dose: 50 Gy
Reference Point Dosage Given to Date: 14 Gy
Reference Point Session Dosage Given: 2 Gy
Session Number: 7

## 2023-06-06 ENCOUNTER — Other Ambulatory Visit: Payer: Self-pay

## 2023-06-06 ENCOUNTER — Inpatient Hospital Stay: Payer: Medicare Other

## 2023-06-06 ENCOUNTER — Ambulatory Visit
Admission: RE | Admit: 2023-06-06 | Discharge: 2023-06-06 | Disposition: A | Discharge status: Home / Self Care | Payer: Medicare Other | Source: Ambulatory Visit | Attending: Radiation Oncology | Admitting: Radiation Oncology

## 2023-06-06 DIAGNOSIS — C155 Malignant neoplasm of lower third of esophagus: Secondary | ICD-10-CM | POA: Diagnosis not present

## 2023-06-06 LAB — RAD ONC ARIA SESSION SUMMARY
Course Elapsed Days: 9
Plan Fractions Treated to Date: 8
Plan Prescribed Dose Per Fraction: 2 Gy
Plan Total Fractions Prescribed: 25
Plan Total Prescribed Dose: 50 Gy
Reference Point Dosage Given to Date: 16 Gy
Reference Point Session Dosage Given: 2 Gy
Session Number: 8

## 2023-06-06 NOTE — Progress Notes (Signed)
 Nutrition Follow-up:  Patient with esophageal cancer.  Patient receiving radiation only.  PEG placed in Nov 2024.   Met with patient following radiation.  RD asked for patient to call wife during visit as she wanted to be called during meeting.  Patient said that she was asleep.  Patient asked that RD call wife later this afternoon.   Patient says that he is getting tube feeding from [facility].  Has been receiving 6 cartons per day.  Wife is feeding him.  Says that Dr Lenn told him that he could eat whatever he can tolerate orally.  He tried peanut butter sandwich and the vomited it back up.  Noted G-tube exchanged on 2/3 along with foley exchange and treated for UTI.    RD called wife ~ 2:15pm per patient request Spoke with wife and she reports that glucerna 1.5 is coming from [employer]  She has not been able to pick up oral protein shakes.  Wife tearful during phone call and reports that patient does not listen to her and does whatever he wants.  She is worried/concerned about him.  Says that she can't get any rest.     Medications: reviewed  Labs: reviewed  Anthropometrics:   Weight 240 lb on 2/3 (?? Unsure if includes wheelchair or not, bilateral BKA).    262 lb on 1/22   Estimated Energy Needs  Kcals: 2600-2975 Protein: 130-148 g Fluid: 2600 ml  NUTRITION DIAGNOSIS: Inadequate oral intake continues relying on feeding tube   INTERVENTION:  Encouraged patient to choose liquids orally as not tolerating solid foods at this time.  Wife to try and get low sugar shake for patient to sip on orally Continue glucerna 1.5, 6 cartons per day and current free water  flush. Wife agreeable to having LCSW reach out to her for support.  RD will contact LSCW.  Wife has RD contact information  MONITORING, EVALUATION, GOAL: weight trends, tube feeding tolerance   NEXT VISIT: Thursday, Feb 20th phone call with wife  [name] B. Dasie, RD, LDN Registered Dietitian [phone]

## 2023-06-07 ENCOUNTER — Ambulatory Visit
Admission: RE | Admit: 2023-06-07 | Discharge: 2023-06-07 | Disposition: A | Discharge status: Home / Self Care | Payer: Medicare Other | Source: Ambulatory Visit | Attending: Radiation Oncology | Admitting: Radiation Oncology

## 2023-06-07 ENCOUNTER — Other Ambulatory Visit: Payer: Self-pay

## 2023-06-07 ENCOUNTER — Inpatient Hospital Stay: Payer: Medicare Other

## 2023-06-07 DIAGNOSIS — C155 Malignant neoplasm of lower third of esophagus: Secondary | ICD-10-CM | POA: Diagnosis not present

## 2023-06-07 LAB — CULTURE, URINE COMPREHENSIVE

## 2023-06-07 LAB — RAD ONC ARIA SESSION SUMMARY
Course Elapsed Days: 10
Plan Fractions Treated to Date: 9
Plan Prescribed Dose Per Fraction: 2 Gy
Plan Total Fractions Prescribed: 25
Plan Total Prescribed Dose: 50 Gy
Reference Point Dosage Given to Date: 18 Gy
Reference Point Session Dosage Given: 2 Gy
Session Number: 9

## 2023-06-07 NOTE — Progress Notes (Signed)
 [facility] CSW Progress Note  Clinical Child Psychotherapist contacted caregiver by phone to provide supportive counseling per the referral from [employer].  [name] expressed feeling overwhelmed with caregiving.  She has her own health issues which are impacting the care she can provide her husband.  Their neighbors have been assisting her.  [name] and [name] have been married for three years.  She said they receive minimal support from family.  Having him go to a SNF is not an option.  She said he is receiving PT/OT at home.   Alec CHRISTELLA Au, LCSW Clinical Social Worker [facility]

## 2023-06-10 ENCOUNTER — Ambulatory Visit: Payer: Medicare Other | Admitting: Physician Assistant

## 2023-06-10 ENCOUNTER — Other Ambulatory Visit: Payer: Self-pay

## 2023-06-10 ENCOUNTER — Telehealth: Payer: Self-pay | Admitting: *Deleted

## 2023-06-10 ENCOUNTER — Ambulatory Visit
Admission: RE | Admit: 2023-06-10 | Discharge: 2023-06-10 | Disposition: A | Discharge status: Home / Self Care | Payer: Medicare Other | Source: Ambulatory Visit | Attending: Radiation Oncology | Admitting: Radiation Oncology

## 2023-06-10 ENCOUNTER — Inpatient Hospital Stay: Payer: Medicare Other

## 2023-06-10 DIAGNOSIS — R339 Retention of urine, unspecified: Secondary | ICD-10-CM

## 2023-06-10 DIAGNOSIS — C155 Malignant neoplasm of lower third of esophagus: Secondary | ICD-10-CM | POA: Diagnosis not present

## 2023-06-10 LAB — RAD ONC ARIA SESSION SUMMARY
Course Elapsed Days: 13
Plan Fractions Treated to Date: 10
Plan Prescribed Dose Per Fraction: 2 Gy
Plan Total Fractions Prescribed: 25
Plan Total Prescribed Dose: 50 Gy
Reference Point Dosage Given to Date: 20 Gy
Reference Point Session Dosage Given: 2 Gy
Session Number: 10

## 2023-06-10 LAB — BLADDER SCAN AMB NON-IMAGING: Scan Result: 139

## 2023-06-10 NOTE — Progress Notes (Signed)
 Catheter Removal  Patient is present today for a catheter removal.  8 ml of water  was drained from the balloon. A 18 FR foley cath was removed from the bladder, no complications were noted. Patient tolerated well.  Performed by: Kaedynce Tapp H RMA  Follow up/ Additional notes: in the pm for PVR

## 2023-06-10 NOTE — Progress Notes (Signed)
 06/10/2023 3:33 PM   [name]. 1956-04-23 [id number]  CC: Chief Complaint  Patient presents with   voiding trial   HPI: [name]. is a 68 y.o. male with PMH chronic Wood perinephric hematoma, esophageal cancer undergoing IMRT with G-tube, and recurrent urinary retention, possibly chronic, who presents today for voiding trial.   Foley catheter removed in the morning, see separate procedure note for details.  He returned to clinic in the afternoon for PVR.  He reports he has voided multiple times without difficulty.  PVR 139 mL.  PMH: Past Medical History:  Diagnosis Date   Arrhythmia    atrial fibrillation   Asthma    CHF (congestive heart failure) (HCC)    COPD (chronic obstructive pulmonary disease) (HCC)    Coronary artery disease    Depression    Diabetes mellitus without complication (HCC)    Gout    History anabolic steroid use    Hyperlipidemia    Hypertension    Hypogonadism in male    MI (myocardial infarction) (HCC)    Morbid obesity (HCC)    Myocardial infarction (HCC)    Peripheral vascular disease (HCC)    Perirectal abscess    Pleurisy    Sleep apnea    CPAP at night, no oxygen    Varicella     Surgical History: Past Surgical History:  Procedure Laterality Date   ABDOMINAL AORTIC ANEURYSM REPAIR     ACHILLES TENDON SURGERY Left 01/10/2021   Procedure: ACHILLES LENGTHENING/KIDNER;  Surgeon: Pink Bridges, DPM;  Location: [facility];  Service: Podiatry;  Laterality: Left;   AMPUTATION Left 10/03/2021   Procedure: AMPUTATION BELOW KNEE;  Surgeon: Jackquelyn Mass, MD;  Location: [facility];  Service: Vascular;  Laterality: Left;   AMPUTATION Wood 05/24/2022   Procedure: AMPUTATION BELOW KNEE;  Surgeon: Celso College, MD;  Location: [facility];  Service: General;  Laterality: Wood;   AMPUTATION TOE Wood 02/10/2016   Procedure: AMPUTATION TOE 3RD TOE;  Surgeon: Anell Baptist, DPM;  Location: [facility];  Service: Podiatry;   Laterality: Wood;   AMPUTATION TOE Left 02/24/2020   Procedure: AMPUTATION TOE;  Surgeon: Pink Bridges, DPM;  Location: [facility];  Service: Podiatry;  Laterality: Left;   APPLICATION OF WOUND VAC Left 02/29/2020   Procedure: APPLICATION OF WOUND VAC;  Surgeon: Pink Bridges, DPM;  Location: [facility];  Service: Podiatry;  Laterality: Left;   BIOPSY  03/15/2023   Procedure: BIOPSY;  Surgeon: Luke Salaam, MD;  Location: [facility] ENDOSCOPY;  Service: Gastroenterology;;   BIOPSY  04/10/2023   Procedure: BIOPSY;  Surgeon: Corky Diener, Alphonsus Jeans, MD;  Location: [facility];  Service: Gastroenterology;;   CARDIAC CATHETERIZATION     CARDIOVERSION [name] 02/13/2022   Procedure: CARDIOVERSION;  Surgeon: Michelle Aid, MD;  Location: [facility];  Service: Cardiovascular;  Laterality: [name];   CARDIOVERSION [name] 04/23/2023   Procedure: CARDIOVERSION;  Surgeon: Devorah Fonder, MD;  Location: [facility];  Service: Cardiovascular;  Laterality: [name];   COLONOSCOPY WITH PROPOFOL  [name] 11/18/2015   Procedure: COLONOSCOPY WITH PROPOFOL ;  Surgeon: Cassie Click, MD;  Location: [facility] ENDOSCOPY;  Service: Endoscopy;  Laterality: [name];   CORONARY ARTERY BYPASS GRAFT     CORONARY STENT INTERVENTION [name] 02/02/2020   Procedure: CORONARY STENT INTERVENTION;  Surgeon: Percival Brace, MD;  Location: [facility];  Service: Cardiovascular;  Laterality: [name];   ESOPHAGOGASTRODUODENOSCOPY (EGD) WITH PROPOFOL  [name] 03/15/2023   Procedure: ESOPHAGOGASTRODUODENOSCOPY (EGD) WITH PROPOFOL ;  Surgeon: Luke Salaam,  MD;  Location: [facility];  Service: Gastroenterology;  Laterality: [name];   FLEXIBLE SIGMOIDOSCOPY [name] 04/10/2023   Procedure: FLEXIBLE SIGMOIDOSCOPY;  Surgeon: Toledo, Alphonsus Jeans, MD;  Location: [facility];  Service: Gastroenterology;  Laterality: [name];   GASTROSTOMY [name] 03/18/2023   Procedure: INSERTION OF GASTROSTOMY TUBE;  Surgeon: Eldred Grego, MD;  Location: [facility];  Service: General;  Laterality:  [name];   IMPACTION REMOVAL  03/15/2023   Procedure: IMPACTION REMOVAL;  Surgeon: Luke Salaam, MD;  Location: [facility];  Service: Gastroenterology;;   INCISION AND DRAINAGE Left 08/07/2021   Procedure: INCISION AND DRAINAGE-Partial Calcanectomy;  Surgeon: Pink Bridges, DPM;  Location: [facility];  Service: Podiatry;  Laterality: Left;   IRRIGATION AND DEBRIDEMENT FOOT Left 02/29/2020   Procedure: IRRIGATION AND DEBRIDEMENT FOOT;  Surgeon: Pink Bridges, DPM;  Location: [facility];  Service: Podiatry;  Laterality: Left;   IRRIGATION AND DEBRIDEMENT FOOT Left 02/24/2020   Procedure: IRRIGATION AND DEBRIDEMENT FOOT;  Surgeon: Pink Bridges, DPM;  Location: [facility];  Service: Podiatry;  Laterality: Left;   KNEE ARTHROSCOPY     LEFT HEART CATH AND CORS/GRAFTS ANGIOGRAPHY [name] 02/02/2020   Procedure: LEFT HEART CATH AND CORS/GRAFTS ANGIOGRAPHY;  Surgeon: Ronney Cola, MD;  Location: [facility];  Service: Cardiovascular;  Laterality: [name];   LOWER EXTREMITY ANGIOGRAPHY Left 02/25/2020   Procedure: Lower Extremity Angiography;  Surgeon: Celso College, MD;  Location: [facility];  Service: Cardiovascular;  Laterality: Left;   LOWER EXTREMITY ANGIOGRAPHY Left 01/04/2021   Procedure: LOWER EXTREMITY ANGIOGRAPHY;  Surgeon: Celso College, MD;  Location: [facility];  Service: Cardiovascular;  Laterality: Left;   LOWER EXTREMITY ANGIOGRAPHY Wood 05/21/2022   Procedure: Lower Extremity Angiography;  Surgeon: Celso College, MD;  Location: [facility];  Service: Cardiovascular;  Laterality: Wood;   METATARSAL HEAD EXCISION Left 01/10/2021   Procedure: METATARSAL HEAD EXCISION - LEFT 5th;  Surgeon: Pink Bridges, DPM;  Location: [facility];  Service: Podiatry;  Laterality: Left;   PERIPHERAL VASCULAR CATHETERIZATION Wood 01/24/2016   Procedure: Lower Extremity Angiography;  Surgeon: Jackquelyn Mass, MD;  Location: [facility];  Service: Cardiovascular;  Laterality:  Wood;   PERIPHERAL VASCULAR CATHETERIZATION Wood 01/25/2016   Procedure: Lower Extremity Angiography;  Surgeon: Jackquelyn Mass, MD;  Location: [facility];  Service: Cardiovascular;  Laterality: Wood;   PORTACATH PLACEMENT [name] 03/18/2023   Procedure: INSERTION PORT-A-CATH;  Surgeon: Eldred Grego, MD;  Location: [facility];  Service: General;  Laterality: [name];   TOE AMPUTATION     TONSILLECTOMY      Home Medications:  Allergies as of 06/10/2023       Reactions   Penicillins Other (See Comments)   Happened at 68 years old and pt. stated he passed out  He has tolerated amoxicillin /clavulanate and ampicillin /sulbactam   Statins    Other reaction(s): Muscle Pain Causes legs to ache per pt   Metformin  And Related Diarrhea        Medication List        Accurate as of June 10, 2023  3:33 PM. If you have any questions, ask your nurse or doctor.          acetaminophen  325 MG tablet Commonly known as: TYLENOL  Place 2 tablets (650 mg total) into feeding tube every 6 (six) hours as needed for mild pain (pain score 1-3) (or Fever >/= 101).   amiodarone  200 MG tablet Commonly known as: PACERONE  Take twice a  day through 1/7 and then once daily   apixaban  5 MG Tabs tablet Commonly known as: Eliquis  Place 1 tablet (5 mg total) into feeding tube 2 (two) times daily.   ascorbic acid  500 MG tablet Commonly known as: VITAMIN C  Place 1 tablet (500 mg total) into feeding tube daily.   cefdinir  300 MG capsule Commonly known as: OMNICEF  Take 1 capsule (300 mg total) by mouth 2 (two) times daily for 7 days.   Cholecalciferol  25 MCG (1000 UT) tablet Place 1 tablet (1,000 Units total) into feeding tube daily. What changed: how much to take   clonazePAM  0.5 MG tablet Commonly known as: KLONOPIN  Place 1 tablet (0.5 mg total) into feeding tube 2 (two) times daily.   Dermacloud Oint Apply 1 Application topically daily as needed.   diphenoxylate -atropine   2.5-0.025 MG tablet Commonly known as: LOMOTIL  Place 1 tablet into feeding tube 4 (four) times daily.   ezetimibe  10 MG tablet Commonly known as: ZETIA  Place 1 tablet (10 mg total) into feeding tube at bedtime.   feeding supplement (GLUCERNA 1.5 CAL) Liqd Place 237 mLs into feeding tube 6 (six) times daily.   ferrous sulfate  300 (60 Fe) MG/5ML syrup Place 5 mLs (300 mg total) into feeding tube daily. What changed: how much to take   fiber supplement (BANATROL TF) liquid Place 60 mLs into feeding tube 2 (two) times daily.   finasteride  5 MG tablet Commonly known as: PROSCAR  Take 1 tablet by mouth daily.   free water  Soln Place 30 mLs into feeding tube 6 (six) times daily.   gabapentin  300 MG capsule Commonly known as: NEURONTIN  Take 1 capsule by mouth at bedtime.   HumaLOG 100 UNIT/ML injection Generic drug: insulin  lispro Inject into the skin.   Lantus  100 UNIT/ML injection Generic drug: insulin  glargine Inject into the skin.   levothyroxine  25 MCG tablet Commonly known as: SYNTHROID  Place 1 tablet (25 mcg total) into feeding tube daily at 6 (six) AM.   lidocaine -prilocaine  cream Commonly known as: EMLA  Apply 1 Application topically as needed.   loperamide  HCl 1 MG/7.5ML suspension Commonly known as: IMODIUM  Place 15 mLs (2 mg total) into feeding tube every 6 (six) hours as needed for diarrhea or loose stools.   metoprolol  tartrate 25 MG tablet Commonly known as: LOPRESSOR  Take 0.5 tablets (12.5 mg total) by mouth 2 (two) times daily.   multivitamin with minerals tablet Take 1 tablet by mouth daily.   nitroGLYCERIN  0.4 MG SL tablet Commonly known as: NITROSTAT  Place 1 tablet (0.4 mg total) under the tongue every 5 (five) minutes as needed for chest pain.   nystatin  powder Commonly known as: MYCOSTATIN /NYSTOP  Apply 1 Application topically 2 (two) times daily. Under belly   ondansetron  4 MG tablet Commonly known as: ZOFRAN  Place 1 tablet (4 mg total)  into feeding tube every 8 (eight) hours as needed for nausea.   pramipexole  1 MG tablet Commonly known as: MIRAPEX  Place 2 tablets (2 mg total) into feeding tube at bedtime.   primidone  250 MG tablet Commonly known as: MYSOLINE  Place 1 tablet (250 mg total) into feeding tube 2 (two) times daily.   tamsulosin  0.4 MG Caps capsule Commonly known as: FLOMAX  Take 0.4 mg by mouth.   vitamin A & D ointment Apply 1 Application topically as needed for dry skin. Apply to stumps   Vitamin B-12 5000 MCG Tbdp Place 10,000 mcg into feeding tube daily.        Allergies:  Allergies  Allergen Reactions  Penicillins Other (See Comments)    Happened at 68 years old and pt. stated he passed out  He has tolerated amoxicillin /clavulanate and ampicillin /sulbactam   Statins     Other reaction(s): Muscle Pain Causes legs to ache per pt   Metformin  And Related Diarrhea    Family History: Family History  Problem Relation Age of Onset   Hypertension Father    Coronary artery disease Father    Alcohol  abuse Father    Heart failure Brother     Social History:   reports that he quit smoking about 8 years ago. His smoking use included cigarettes. He started smoking about 53 years ago. He has a 22.5 pack-year smoking history. He has never used smokeless tobacco. He reports that he does not currently use alcohol  after a past usage of about 3.0 standard drinks of alcohol  per week. He reports that he does not use drugs.  Physical Exam: There were no vitals taken for this visit.  Constitutional:  Alert and oriented, no acute distress, nontoxic appearing HEENT: [place], AT Cardiovascular: No clubbing, cyanosis, or edema Respiratory: Normal respiratory effort, no increased work of breathing Skin: No rashes, bruises or suspicious lesions Neurologic: Grossly intact, no focal deficits, moving all 4 extremities Psychiatric: Normal mood and affect  Laboratory Data: Results for orders placed or performed in  visit on 06/10/23  Bladder Scan (Post Void Residual) in office   Collection Time: 06/10/23  3:13 PM  Result Value Ref Range   Scan Result 139 ml    Assessment & Plan:   1. Urinary retention (Primary) Voiding trial passed.  Will plan for repeat PVR in 4 weeks.  He can follow-up sooner if needed.  He is in agreement with this plan. - Bladder Scan (Post Void Residual) in office  Return in about 4 weeks (around 07/08/2023) for Repeat PVR.  Kathreen Pare, PA-C  [facility] [place] [address], [address] [place], [place] [address] [phone]

## 2023-06-10 NOTE — Telephone Encounter (Signed)
 Patient forgot to get his labs before he got his radiation today and the wife called saying that can he do it tomorrow I spoke to Burdette Carolin and she says she will make the lab appointment after his radiation tomorrow.  And wife knows that

## 2023-06-11 ENCOUNTER — Telehealth: Payer: Self-pay | Admitting: *Deleted

## 2023-06-11 ENCOUNTER — Ambulatory Visit: Payer: Medicare Other

## 2023-06-11 ENCOUNTER — Inpatient Hospital Stay: Payer: Medicare Other

## 2023-06-11 NOTE — Telephone Encounter (Signed)
Wife called to say he is not coming today,she was helping him with urine and it went allover him and the floor and it is mess, he is not coming. I let the radiation about it also.

## 2023-06-12 ENCOUNTER — Ambulatory Visit
Admission: RE | Admit: 2023-06-12 | Discharge: 2023-06-12 | Disposition: A | Discharge status: Home / Self Care | Payer: Medicare Other | Source: Ambulatory Visit | Attending: Radiation Oncology | Admitting: Radiation Oncology

## 2023-06-12 ENCOUNTER — Inpatient Hospital Stay: Payer: Medicare Other

## 2023-06-12 ENCOUNTER — Other Ambulatory Visit: Payer: Self-pay

## 2023-06-12 DIAGNOSIS — C155 Malignant neoplasm of lower third of esophagus: Secondary | ICD-10-CM

## 2023-06-12 LAB — RAD ONC ARIA SESSION SUMMARY
Course Elapsed Days: 15
Plan Fractions Treated to Date: 11
Plan Prescribed Dose Per Fraction: 2 Gy
Plan Total Fractions Prescribed: 25
Plan Total Prescribed Dose: 50 Gy
Reference Point Dosage Given to Date: 22 Gy
Reference Point Session Dosage Given: 2 Gy
Session Number: 11

## 2023-06-12 LAB — CBC (CANCER CENTER ONLY)
HCT: 34.6 % — ABNORMAL LOW (ref 39.0–52.0)
Hemoglobin: 10.5 g/dL — ABNORMAL LOW (ref 13.0–17.0)
MCH: 29.6 pg (ref 26.0–34.0)
MCHC: 30.3 g/dL (ref 30.0–36.0)
MCV: 97.5 fL (ref 80.0–100.0)
Platelet Count: 267 10*3/uL (ref 150–400)
RBC: 3.55 MIL/uL — ABNORMAL LOW (ref 4.22–5.81)
RDW: 16.5 % — ABNORMAL HIGH (ref 11.5–15.5)
WBC Count: 9.9 10*3/uL (ref 4.0–10.5)
nRBC: 0 % (ref 0.0–0.2)

## 2023-06-13 ENCOUNTER — Other Ambulatory Visit: Payer: Self-pay

## 2023-06-13 ENCOUNTER — Emergency Department: Payer: Medicare Other

## 2023-06-13 ENCOUNTER — Encounter: Payer: Self-pay | Admitting: Emergency Medicine

## 2023-06-13 ENCOUNTER — Ambulatory Visit
Admission: RE | Admit: 2023-06-13 | Discharge: 2023-06-13 | Disposition: A | Discharge status: Home / Self Care | Payer: Medicare Other | Source: Ambulatory Visit | Attending: Radiation Oncology | Admitting: Radiation Oncology

## 2023-06-13 DIAGNOSIS — Z6839 Body mass index (BMI) 39.0-39.9, adult: Secondary | ICD-10-CM

## 2023-06-13 DIAGNOSIS — I2510 Atherosclerotic heart disease of native coronary artery without angina pectoris: Secondary | ICD-10-CM | POA: Diagnosis present

## 2023-06-13 DIAGNOSIS — Z79890 Hormone replacement therapy (postmenopausal): Secondary | ICD-10-CM

## 2023-06-13 DIAGNOSIS — Z87891 Personal history of nicotine dependence: Secondary | ICD-10-CM

## 2023-06-13 DIAGNOSIS — C155 Malignant neoplasm of lower third of esophagus: Secondary | ICD-10-CM | POA: Diagnosis not present

## 2023-06-13 DIAGNOSIS — Z7901 Long term (current) use of anticoagulants: Secondary | ICD-10-CM

## 2023-06-13 DIAGNOSIS — Z515 Encounter for palliative care: Secondary | ICD-10-CM

## 2023-06-13 DIAGNOSIS — Z888 Allergy status to other drugs, medicaments and biological substances status: Secondary | ICD-10-CM

## 2023-06-13 DIAGNOSIS — R079 Chest pain, unspecified: Secondary | ICD-10-CM | POA: Diagnosis not present

## 2023-06-13 DIAGNOSIS — I110 Hypertensive heart disease with heart failure: Secondary | ICD-10-CM | POA: Diagnosis present

## 2023-06-13 DIAGNOSIS — C772 Secondary and unspecified malignant neoplasm of intra-abdominal lymph nodes: Secondary | ICD-10-CM | POA: Diagnosis present

## 2023-06-13 DIAGNOSIS — Z880 Allergy status to penicillin: Secondary | ICD-10-CM

## 2023-06-13 DIAGNOSIS — R911 Solitary pulmonary nodule: Secondary | ICD-10-CM | POA: Diagnosis present

## 2023-06-13 DIAGNOSIS — I252 Old myocardial infarction: Secondary | ICD-10-CM

## 2023-06-13 DIAGNOSIS — Z931 Gastrostomy status: Secondary | ICD-10-CM

## 2023-06-13 DIAGNOSIS — Z955 Presence of coronary angioplasty implant and graft: Secondary | ICD-10-CM

## 2023-06-13 DIAGNOSIS — Z811 Family history of alcohol abuse and dependence: Secondary | ICD-10-CM

## 2023-06-13 DIAGNOSIS — E875 Hyperkalemia: Principal | ICD-10-CM | POA: Diagnosis present

## 2023-06-13 DIAGNOSIS — I452 Bifascicular block: Secondary | ICD-10-CM | POA: Diagnosis present

## 2023-06-13 DIAGNOSIS — Z951 Presence of aortocoronary bypass graft: Secondary | ICD-10-CM

## 2023-06-13 DIAGNOSIS — Z89511 Acquired absence of right leg below knee: Secondary | ICD-10-CM

## 2023-06-13 DIAGNOSIS — F4322 Adjustment disorder with anxiety: Secondary | ICD-10-CM | POA: Diagnosis present

## 2023-06-13 DIAGNOSIS — Z794 Long term (current) use of insulin: Secondary | ICD-10-CM

## 2023-06-13 DIAGNOSIS — I959 Hypotension, unspecified: Secondary | ICD-10-CM | POA: Diagnosis present

## 2023-06-13 DIAGNOSIS — G9341 Metabolic encephalopathy: Secondary | ICD-10-CM | POA: Diagnosis not present

## 2023-06-13 DIAGNOSIS — J4489 Other specified chronic obstructive pulmonary disease: Secondary | ICD-10-CM | POA: Diagnosis present

## 2023-06-13 DIAGNOSIS — Z9221 Personal history of antineoplastic chemotherapy: Secondary | ICD-10-CM

## 2023-06-13 DIAGNOSIS — Z8249 Family history of ischemic heart disease and other diseases of the circulatory system: Secondary | ICD-10-CM

## 2023-06-13 DIAGNOSIS — Z89512 Acquired absence of left leg below knee: Secondary | ICD-10-CM

## 2023-06-13 DIAGNOSIS — F32A Depression, unspecified: Secondary | ICD-10-CM | POA: Diagnosis present

## 2023-06-13 DIAGNOSIS — Z8619 Personal history of other infectious and parasitic diseases: Secondary | ICD-10-CM

## 2023-06-13 DIAGNOSIS — I5032 Chronic diastolic (congestive) heart failure: Secondary | ICD-10-CM | POA: Diagnosis present

## 2023-06-13 DIAGNOSIS — Z79899 Other long term (current) drug therapy: Secondary | ICD-10-CM

## 2023-06-13 DIAGNOSIS — E1151 Type 2 diabetes mellitus with diabetic peripheral angiopathy without gangrene: Secondary | ICD-10-CM | POA: Diagnosis present

## 2023-06-13 DIAGNOSIS — E785 Hyperlipidemia, unspecified: Secondary | ICD-10-CM | POA: Diagnosis present

## 2023-06-13 DIAGNOSIS — C7889 Secondary malignant neoplasm of other digestive organs: Secondary | ICD-10-CM | POA: Diagnosis present

## 2023-06-13 DIAGNOSIS — L598 Other specified disorders of the skin and subcutaneous tissue related to radiation: Secondary | ICD-10-CM | POA: Diagnosis present

## 2023-06-13 DIAGNOSIS — I4819 Other persistent atrial fibrillation: Secondary | ICD-10-CM | POA: Diagnosis present

## 2023-06-13 LAB — RAD ONC ARIA SESSION SUMMARY
Course Elapsed Days: 16
Plan Fractions Treated to Date: 12
Plan Prescribed Dose Per Fraction: 2 Gy
Plan Total Fractions Prescribed: 25
Plan Total Prescribed Dose: 50 Gy
Reference Point Dosage Given to Date: 24 Gy
Reference Point Session Dosage Given: 2 Gy
Session Number: 12

## 2023-06-13 LAB — CBC
HCT: 31.1 % — ABNORMAL LOW (ref 39.0–52.0)
Hemoglobin: 10 g/dL — ABNORMAL LOW (ref 13.0–17.0)
MCH: 29.8 pg (ref 26.0–34.0)
MCHC: 32.2 g/dL (ref 30.0–36.0)
MCV: 92.6 fL (ref 80.0–100.0)
Platelets: 243 10*3/uL (ref 150–400)
RBC: 3.36 MIL/uL — ABNORMAL LOW (ref 4.22–5.81)
RDW: 16.6 % — ABNORMAL HIGH (ref 11.5–15.5)
WBC: 11.8 10*3/uL — ABNORMAL HIGH (ref 4.0–10.5)
nRBC: 0 % (ref 0.0–0.2)

## 2023-06-13 LAB — BASIC METABOLIC PANEL
Anion gap: 11 (ref 5–15)
BUN: 53 mg/dL — ABNORMAL HIGH (ref 8–23)
CO2: 24 mmol/L (ref 22–32)
Calcium: 9.1 mg/dL (ref 8.9–10.3)
Chloride: 100 mmol/L (ref 98–111)
Creatinine, Ser: 1.02 mg/dL (ref 0.61–1.24)
GFR, Estimated: >60 mL/min (ref >60)
Glucose, Bld: 120 mg/dL — ABNORMAL HIGH (ref 70–99)
Potassium: 6 mmol/L — ABNORMAL HIGH (ref 3.5–5.1)
Sodium: 135 mmol/L (ref 135–145)

## 2023-06-13 LAB — TROPONIN I (HIGH SENSITIVITY): Troponin I (High Sensitivity): 11 ng/L (ref <18)

## 2023-06-13 NOTE — ED Triage Notes (Signed)
Patient presents to ED by [facility] for chest pain starting at 2100,  Patient has a history of STEMI, receiving chemo for larynx cancer.

## 2023-06-14 ENCOUNTER — Inpatient Hospital Stay
Admission: EM | Admit: 2023-06-14 | Discharge: 2023-06-16 | DRG: 374 | Disposition: A | Discharge status: Home / Self Care | Payer: Medicare Other | Attending: Internal Medicine | Admitting: Internal Medicine

## 2023-06-14 ENCOUNTER — Ambulatory Visit: Payer: Medicare Other

## 2023-06-14 ENCOUNTER — Inpatient Hospital Stay: Payer: Medicare Other

## 2023-06-14 ENCOUNTER — Emergency Department: Payer: Medicare Other

## 2023-06-14 ENCOUNTER — Telehealth: Payer: Self-pay | Admitting: *Deleted

## 2023-06-14 ENCOUNTER — Encounter: Payer: Self-pay | Admitting: Internal Medicine

## 2023-06-14 DIAGNOSIS — E875 Hyperkalemia: Secondary | ICD-10-CM | POA: Diagnosis present

## 2023-06-14 DIAGNOSIS — Z931 Gastrostomy status: Secondary | ICD-10-CM | POA: Diagnosis not present

## 2023-06-14 DIAGNOSIS — C159 Malignant neoplasm of esophagus, unspecified: Secondary | ICD-10-CM | POA: Diagnosis not present

## 2023-06-14 DIAGNOSIS — F32A Depression, unspecified: Secondary | ICD-10-CM | POA: Diagnosis present

## 2023-06-14 DIAGNOSIS — I2510 Atherosclerotic heart disease of native coronary artery without angina pectoris: Secondary | ICD-10-CM

## 2023-06-14 DIAGNOSIS — I110 Hypertensive heart disease with heart failure: Secondary | ICD-10-CM | POA: Diagnosis present

## 2023-06-14 DIAGNOSIS — C7889 Secondary malignant neoplasm of other digestive organs: Secondary | ICD-10-CM | POA: Diagnosis present

## 2023-06-14 DIAGNOSIS — Z79890 Hormone replacement therapy (postmenopausal): Secondary | ICD-10-CM | POA: Diagnosis not present

## 2023-06-14 DIAGNOSIS — Z89511 Acquired absence of right leg below knee: Secondary | ICD-10-CM | POA: Diagnosis not present

## 2023-06-14 DIAGNOSIS — E1169 Type 2 diabetes mellitus with other specified complication: Secondary | ICD-10-CM | POA: Diagnosis present

## 2023-06-14 DIAGNOSIS — Z8249 Family history of ischemic heart disease and other diseases of the circulatory system: Secondary | ICD-10-CM | POA: Diagnosis not present

## 2023-06-14 DIAGNOSIS — R197 Diarrhea, unspecified: Secondary | ICD-10-CM | POA: Diagnosis present

## 2023-06-14 DIAGNOSIS — Z794 Long term (current) use of insulin: Secondary | ICD-10-CM | POA: Diagnosis not present

## 2023-06-14 DIAGNOSIS — I4819 Other persistent atrial fibrillation: Secondary | ICD-10-CM | POA: Diagnosis present

## 2023-06-14 DIAGNOSIS — J449 Chronic obstructive pulmonary disease, unspecified: Secondary | ICD-10-CM | POA: Diagnosis not present

## 2023-06-14 DIAGNOSIS — C155 Malignant neoplasm of lower third of esophagus: Secondary | ICD-10-CM | POA: Diagnosis present

## 2023-06-14 DIAGNOSIS — Z515 Encounter for palliative care: Secondary | ICD-10-CM | POA: Diagnosis not present

## 2023-06-14 DIAGNOSIS — Z7901 Long term (current) use of anticoagulants: Secondary | ICD-10-CM | POA: Diagnosis not present

## 2023-06-14 DIAGNOSIS — I5032 Chronic diastolic (congestive) heart failure: Secondary | ICD-10-CM | POA: Diagnosis present

## 2023-06-14 DIAGNOSIS — L598 Other specified disorders of the skin and subcutaneous tissue related to radiation: Secondary | ICD-10-CM | POA: Diagnosis present

## 2023-06-14 DIAGNOSIS — R079 Chest pain, unspecified: Secondary | ICD-10-CM | POA: Diagnosis present

## 2023-06-14 DIAGNOSIS — I452 Bifascicular block: Secondary | ICD-10-CM | POA: Diagnosis present

## 2023-06-14 DIAGNOSIS — I959 Hypotension, unspecified: Secondary | ICD-10-CM

## 2023-06-14 DIAGNOSIS — I10 Essential (primary) hypertension: Secondary | ICD-10-CM | POA: Diagnosis present

## 2023-06-14 DIAGNOSIS — I739 Peripheral vascular disease, unspecified: Secondary | ICD-10-CM | POA: Diagnosis present

## 2023-06-14 DIAGNOSIS — G9341 Metabolic encephalopathy: Secondary | ICD-10-CM | POA: Diagnosis not present

## 2023-06-14 DIAGNOSIS — S37019A Minor contusion of unspecified kidney, initial encounter: Secondary | ICD-10-CM | POA: Diagnosis present

## 2023-06-14 DIAGNOSIS — F4320 Adjustment disorder, unspecified: Secondary | ICD-10-CM | POA: Diagnosis not present

## 2023-06-14 DIAGNOSIS — R911 Solitary pulmonary nodule: Secondary | ICD-10-CM

## 2023-06-14 DIAGNOSIS — C772 Secondary and unspecified malignant neoplasm of intra-abdominal lymph nodes: Secondary | ICD-10-CM | POA: Diagnosis present

## 2023-06-14 DIAGNOSIS — J439 Emphysema, unspecified: Secondary | ICD-10-CM | POA: Diagnosis not present

## 2023-06-14 DIAGNOSIS — E785 Hyperlipidemia, unspecified: Secondary | ICD-10-CM | POA: Diagnosis present

## 2023-06-14 DIAGNOSIS — Z89512 Acquired absence of left leg below knee: Secondary | ICD-10-CM | POA: Diagnosis not present

## 2023-06-14 DIAGNOSIS — J4489 Other specified chronic obstructive pulmonary disease: Secondary | ICD-10-CM | POA: Diagnosis present

## 2023-06-14 DIAGNOSIS — E1151 Type 2 diabetes mellitus with diabetic peripheral angiopathy without gangrene: Secondary | ICD-10-CM | POA: Diagnosis present

## 2023-06-14 DIAGNOSIS — K909 Intestinal malabsorption, unspecified: Secondary | ICD-10-CM | POA: Diagnosis not present

## 2023-06-14 LAB — COMPREHENSIVE METABOLIC PANEL
ALT: 36 U/L (ref 0–44)
AST: 22 U/L (ref 15–41)
Albumin: 2.7 g/dL — ABNORMAL LOW (ref 3.5–5.0)
Alkaline Phosphatase: 121 U/L (ref 38–126)
Anion gap: 11 (ref 5–15)
BUN: 50 mg/dL — ABNORMAL HIGH (ref 8–23)
CO2: 26 mmol/L (ref 22–32)
Calcium: 9.1 mg/dL (ref 8.9–10.3)
Chloride: 100 mmol/L (ref 98–111)
Creatinine, Ser: 1.05 mg/dL (ref 0.61–1.24)
GFR, Estimated: >60 mL/min (ref >60)
Glucose, Bld: 110 mg/dL — ABNORMAL HIGH (ref 70–99)
Potassium: 5.8 mmol/L — ABNORMAL HIGH (ref 3.5–5.1)
Sodium: 137 mmol/L (ref 135–145)
Total Bilirubin: 0.5 mg/dL (ref 0.0–1.2)
Total Protein: 7.1 g/dL (ref 6.5–8.1)

## 2023-06-14 LAB — BASIC METABOLIC PANEL
Anion gap: 9 (ref 5–15)
BUN: 39 mg/dL — ABNORMAL HIGH (ref 8–23)
CO2: 24 mmol/L (ref 22–32)
Calcium: 8.8 mg/dL — ABNORMAL LOW (ref 8.9–10.3)
Chloride: 100 mmol/L (ref 98–111)
Creatinine, Ser: 0.91 mg/dL (ref 0.61–1.24)
GFR, Estimated: >60 mL/min (ref >60)
Glucose, Bld: 180 mg/dL — ABNORMAL HIGH (ref 70–99)
Potassium: 6.1 mmol/L — ABNORMAL HIGH (ref 3.5–5.1)
Sodium: 133 mmol/L — ABNORMAL LOW (ref 135–145)

## 2023-06-14 LAB — TROPONIN I (HIGH SENSITIVITY): Troponin I (High Sensitivity): 11 ng/L (ref <18)

## 2023-06-14 LAB — LACTIC ACID, PLASMA
Lactic Acid, Venous: 0.9 mmol/L (ref 0.5–1.9)
Lactic Acid, Venous: 0.6 mmol/L (ref 0.5–1.9)

## 2023-06-14 LAB — BRAIN NATRIURETIC PEPTIDE: B Natriuretic Peptide: 115.2 pg/mL — ABNORMAL HIGH (ref 0.0–100.0)

## 2023-06-14 MED ORDER — FREE WATER
30.0000 mL | Freq: Every day | Status: DC
  Administered 2023-06-14 (×2): 30 mL
  Filled 2023-06-14 (×5): qty 30

## 2023-06-14 MED ORDER — APIXABAN 5 MG PO TABS
5.0000 mg | ORAL_TABLET | Freq: Two times a day (BID) | ORAL | Status: DC
  Administered 2023-06-14 – 2023-06-16 (×5): 5 mg
  Filled 2023-06-14 (×5): qty 1

## 2023-06-14 MED ORDER — GLUCERNA 1.5 CAL PO LIQD
237.0000 mL | Freq: Every day | ORAL | Status: DC
  Administered 2023-06-14 – 2023-06-16 (×11): 237 mL

## 2023-06-14 MED ORDER — IOHEXOL 350 MG/ML SOLN
100.0000 mL | Freq: Once | INTRAVENOUS | Status: AC | PRN
  Administered 2023-06-14: 100 mL via INTRAVENOUS

## 2023-06-14 MED ORDER — ONDANSETRON HCL 4 MG/2ML IJ SOLN: 4.0000 mg | Freq: Four times a day (QID) | INTRAMUSCULAR | Status: DC | PRN

## 2023-06-14 MED ORDER — SODIUM ZIRCONIUM CYCLOSILICATE 10 G PO PACK
10.0000 g | PACK | Freq: Once | ORAL | Status: AC
  Administered 2023-06-14: 10 g via ORAL
  Filled 2023-06-14: qty 1

## 2023-06-14 MED ORDER — FREE WATER
140.0000 mL | Freq: Every day | Status: DC
  Administered 2023-06-14 – 2023-06-16 (×9): 140 mL
  Filled 2023-06-14 (×2): qty 140

## 2023-06-14 MED ORDER — ACETAMINOPHEN 325 MG PO TABS
650.0000 mg | ORAL_TABLET | ORAL | Status: DC | PRN
  Administered 2023-06-15: 650 mg via ORAL
  Filled 2023-06-14 (×2): qty 2

## 2023-06-14 MED ORDER — LACTATED RINGERS IV BOLUS
500.0000 mL | Freq: Once | INTRAVENOUS | Status: AC
  Administered 2023-06-14: 500 mL via INTRAVENOUS

## 2023-06-14 MED ORDER — LACTATED RINGERS IV BOLUS
1000.0000 mL | Freq: Once | INTRAVENOUS | Status: AC
Start: 2023-06-14 — End: 2023-06-14
  Administered 2023-06-14: 1000 mL via INTRAVENOUS

## 2023-06-14 MED ORDER — LACTATED RINGERS IV BOLUS: 1000.0000 mL | Freq: Once | INTRAVENOUS | Status: DC

## 2023-06-14 MED ORDER — LACTATED RINGERS IV BOLUS: 500.0000 mL | Freq: Once | INTRAVENOUS | Status: DC

## 2023-06-14 MED ORDER — IOHEXOL 300 MG/ML  SOLN
100.0000 mL | Freq: Once | INTRAMUSCULAR | Status: AC | PRN
  Administered 2023-06-14: 100 mL via INTRAVENOUS

## 2023-06-14 NOTE — Assessment & Plan Note (Signed)
LLN BP  Titrate BP regimen

## 2023-06-14 NOTE — Assessment & Plan Note (Signed)
Baseline Distal esophageal cancer with extension into the proximal stomach and metastatic high left paratracheal and gastrohepatic ligament lymph nodes.  On palliative radiation  Followed by Dr. Donneta Romberg  Suspect radiation and esophageal cancer is confounding issue to chest pain

## 2023-06-14 NOTE — Assessment & Plan Note (Signed)
Blood sugar 110s  SSI  Monitor

## 2023-06-14 NOTE — ED Notes (Signed)
Assumed patient care at 0715 and received report from the previous nurse.

## 2023-06-14 NOTE — Assessment & Plan Note (Signed)
S/p bilateral BKA (left 2023, right 2024)

## 2023-06-14 NOTE — Assessment & Plan Note (Signed)
Stable from a resp standpoint  Cont home inhalers

## 2023-06-14 NOTE — ED Notes (Signed)
Called lab to draw labs due to difficult blood draw.

## 2023-06-14 NOTE — Progress Notes (Signed)
Patient had 80ml omni 350 extravasation right ac dr tan assessed patient iv was removed Arm was elevated ,ice was placed site verbal instructions were given to pt a  verbal handoff given to nurse discharge orders were placed.

## 2023-06-14 NOTE — Assessment & Plan Note (Signed)
2D ECHO 03/2023 w/ EF 55% and grade 1 DD  Mildly dry  IVF hydration  Wt 108.9kg today  Monitor volume status

## 2023-06-14 NOTE — Telephone Encounter (Signed)
Wife called to say that he will not be in radiation today. He went to ER for heart pain. She was told that the radiation at the area he gets treatment and can cause heart pain. She feels that they will be back on Monday but she will lets [place] know if anything else happens.

## 2023-06-14 NOTE — ED Notes (Signed)
RUE elevated and ice pack applied per order

## 2023-06-14 NOTE — ED Provider Notes (Signed)
[facility] Provider Note    Event Date/Time   First MD Initiated Contact with Patient 06/14/23 0008     (approximate)   History   Chest Pain   HPI  [name]. is a 68 y.o. male with history of laryngeal cancer on radiation, CHF, COPD, CAD, diabetes, on Eliquis presenting with chest pain on the right, nonradiating.  He says no shortness of breath or cough, no fever, no nausea, vomiting, diarrhea.  He says he is voiding spontaneously.   On independent chart review he does have history of urinary tension in the past, was seen by urologist in February of this year.  Passed voiding trial and is due to repeat PVR in 4 weeks.  Physical Exam   Triage Vital Signs: ED Triage Vitals  Encounter Vitals Group     BP 06/13/23 2232 121/83     Systolic BP Percentile --      Diastolic BP Percentile --      Pulse Rate 06/13/23 2232 (!) 104     Resp 06/13/23 2232 20     Temp 06/13/23 2232 97.9 F (36.6 C)     Temp Source 06/13/23 2232 Oral     SpO2 06/13/23 2232 95 %     Weight 06/13/23 2233 240 lb 1.3 oz (108.9 kg)     Height 06/13/23 2233 5\' 5"  (1.651 m)     Head Circumference --      Peak Flow --      Pain Score --      Pain Loc --      Pain Education --      Exclude from Growth Chart --     Most recent vital signs: Vitals:   06/14/23 0530 06/14/23 0630  BP: (!) 99/49 (!) 93/52  Pulse: 94 92  Resp: 17 17  Temp:    SpO2: 94% 91%     General: Awake, no distress.  CV:  Good peripheral perfusion.  Resp:  Normal effort.  Not tachypneic, no work of breathing Abd:  No distention.  Soft nontender Other:  Mildly dry on exam, status post bilateral BKA's, trace edema bilaterally   ED Results / Procedures / Treatments   Labs (all labs ordered are listed, but only abnormal results are displayed) Labs Reviewed  BASIC METABOLIC PANEL - Abnormal; Notable for the following components:      Result Value   Potassium 6.0 (*)    Glucose, Bld 120  (*)    BUN 53 (*)    All other components within normal limits  CBC - Abnormal; Notable for the following components:   WBC 11.8 (*)    RBC 3.36 (*)    Hemoglobin 10.0 (*)    HCT 31.1 (*)    RDW 16.6 (*)    All other components within normal limits  COMPREHENSIVE METABOLIC PANEL - Abnormal; Notable for the following components:   Potassium 5.8 (*)    Glucose, Bld 110 (*)    BUN 50 (*)    Albumin 2.7 (*)    All other components within normal limits  BRAIN NATRIURETIC PEPTIDE - Abnormal; Notable for the following components:   B Natriuretic Peptide 115.2 (*)    All other components within normal limits  CULTURE, BLOOD (ROUTINE X 2)  CULTURE, BLOOD (ROUTINE X 2)  LACTIC ACID, PLASMA  LACTIC ACID, PLASMA  URINALYSIS, W/ REFLEX TO CULTURE (INFECTION SUSPECTED)  TROPONIN I (HIGH SENSITIVITY)  TROPONIN I (HIGH SENSITIVITY)  EKG  Sinus tachycardia, rate 101, widened QRS, normal QTc, right bundle branch block, no ischemic ST elevation, T wave flattening in aVL, not significant change compared to prior   RADIOLOGY Chest x-ray on my interpretation without focal consolidation   PROCEDURES:  Critical Care performed: Yes, see critical care procedure note(s)  Ultrasound ED Thoracic  Date/Time: 06/14/2023 4:12 AM  Performed by: Claybon Jabs, MD Authorized by: Claybon Jabs, MD   Procedure details:    Indications: hypotension     Left lung pleural:  Visualized   Right lung pleural:  Visualized Findings:    A-lines noted throughout: identified     B-lines noted throughout: not identified   Comments:     No B-lines on exam .Critical Care  Performed by: Claybon Jabs, MD Authorized by: Claybon Jabs, MD   Critical care provider statement:    Critical care time (minutes):  40   Critical care was time spent personally by me on the following activities:  Development of treatment plan with patient or surrogate, discussions with consultants, evaluation of patient's response to  treatment, examination of patient, ordering and review of laboratory studies, ordering and review of radiographic studies, ordering and performing treatments and interventions, pulse oximetry, re-evaluation of patient's condition and review of old charts    MEDICATIONS ORDERED IN ED: Medications  lactated ringers bolus 500 mL (0 mLs Intravenous Stopped 06/14/23 0301)  iohexol (OMNIPAQUE) 350 MG/ML injection 100 mL (100 mLs Intravenous Contrast Given 06/14/23 0112)  sodium zirconium cyclosilicate (LOKELMA) packet 10 g (10 g Oral Given 06/14/23 0319)  lactated ringers bolus 1,000 mL (1,000 mLs Intravenous New Bag/Given 06/14/23 0432)     IMPRESSION / MDM / ASSESSMENT AND PLAN / ED COURSE  I reviewed the triage vital signs and the nursing notes.                              Differential diagnosis includes, but is not limited to, ACS, PE, he denies any infectious symptoms to suggest pneumonia or COVID, influenza, RSV, musculoskeletal pain.  Labs were obtained out of triage, as well as an EKG.  Was found to have K of 6, his creatinine is 1.  Will repeat the lab to make sure that is not a lab error.  Will give him some fluids.  There are no obvious EKG changes compared to prior at this time.  Will also get a CT PE study.  Patient's presentation is most consistent with acute presentation with potential threat to life or bodily function.  Patient found to have hyperkalemia, dry on exam, no B-lines after initial fluid bolus.  Given negative no fluids.  He was also having some soft blood pressures in the 90s.  UA is pending but I believe he needs to be admitted for fluids and monitoring.  Consulted hospitalist who will evaluate the patient.  He is admitted.  Clinical Course as of 06/14/23 8657  Fri Jun 14, 2023  0200 Patient's IV had some extra when he went for CT, site was elevated, ice pack was placed, he is no pain or swelling there. [TT]  0201 Blood pressures were also noted to be in the 80s, added  on blood cultures, lactate and UA.  Went to reassess patient and found that his arm was above his head, repeat blood pressure now systolics of 100s. [TT]  0219 CT Angio Chest PE W/Cm &/Or Wo Cm IMPRESSION:  1. No  pulmonary embolism. No acute intrathoracic pathology  identified.  2. Stable marked circumferential thickening of the distal esophagus  with paraesophageal infiltration in keeping with the known  underlying esophageal malignancy.  3. Stable 5 mm noncalcified pulmonary nodule within the right upper  lobe, indeterminate. Close attention on subsequent surveillance  imaging is warranted.  4. Stable shotty gastrohepatic and right retrocrural adenopathy.   [TT]  0322 DG Chest Port 1 View IMPRESSION: 1. Stable chest, no acute process.   [TT]  [id number] Patient keeps moving his arm above his head, last blood pressure was systolic of 90s, had him put his arm down and retook the blood pressure, is 117 systolic. [TT]  0411 Repeat blood pressures now in the 90s.  Per wife he sometimes does have blood pressures in the 90s.  She has been trying to keep on top of his fluid intake since he has a G-tube.  I did a bedside ultrasound did not show any B-lines.  I think he is okay to get a little bit more fluids. [TT]    Clinical Course User Index [TT] Jodie Echevaria, Franchot Erichsen, MD     FINAL CLINICAL IMPRESSION(S) / ED DIAGNOSES   Final diagnoses:  Chest pain, unspecified type  Pulmonary nodule  Hyperkalemia  Hypotension, unspecified hypotension type     Rx / DC Orders   ED Discharge Orders     None        Note:  This document was prepared using Dragon voice recognition software and may include unintentional dictation errors.    Claybon Jabs, MD 06/14/23 [id number]

## 2023-06-14 NOTE — Assessment & Plan Note (Signed)
Recent admission 03/2023 w/ noted c diff assd diarrhea- resolved  On lomotil

## 2023-06-14 NOTE — Assessment & Plan Note (Signed)
Noted chronic complex hematoma dating back over a year from 03/2023 imaging-evaluated by Dr. Apolinar Junes w/ urology, nothing that has been drainable thus far, possible underlying renal mass. No active abd pain at present  Will repeat imaging in setting of moderate to severe R sided chest pain  Monitor.

## 2023-06-14 NOTE — Assessment & Plan Note (Signed)
K 5.8 today  Recurrent issue EKG stable  S/p lokelma  Monitor

## 2023-06-14 NOTE — H&P (Signed)
History and Physical    Patient: [name]. UJW:[id number] DOB: 08/30/1955 DOA: 06/14/2023 DOS: the patient was seen and examined on 06/14/2023 PCP: [name], MD  Patient coming from: Home  Chief Complaint:  Chief Complaint  Patient presents with   Chest Pain   HPI: [name]. is a 68 y.o. male with medical history significant of atrial fibrillation, asthma, congestive heart failure, COPD, coronary artery disease, depression, diabetes mellitus, gout, hyperlipidemia, hypertension, hypogonadism in male, morbid obesity, peripheral vascular disease, OSA on CPAP at night, esophageal cancer status post chemo and G-tube placement on palliative radiation presenting w/ chest pain.  Patient reports acute right-sided chest pain since yesterday evening.  Right-sided chest pain moderate to severe in intensity.  No reported alleviating or aggravating factors.  No associated nausea or diaphoresis.  Noted baseline history of STEMI and CAD status post CABG.  Chest pain not similar to prior episodes of cardiac related chest pain.  Noted baseline esophageal cancer with G-tube in place.  Patient reports getting palliative chemotherapy under roughly daily basis.  Has had radiation over affected area multiple times.  Can only swallow small amounts of fluid on a regular basis.  No shortness of breath.  No abdominal pain.  No orthopnea.  No PND.  Took aspirin nitroglycerin with minimal improvement in symptoms.  Reports mild similar episodes of chest pain like this in the past. Presented to the ER afebrile, hemodynamically stable.  Satting well on room air.  White count 11.8, hemoglobin 10, platelets 243, lactate 0.9, troponin negative x 2.  EKG sinus tach.  Potassium 6-->5.8.  Creatinine 1.CXR WNL. CTA negative for PE. Noted stable marked circumferential thickening of the distal esophagus with paraesophageal infiltration in keeping with the known underlying esophageal malignancy Review of  Systems: As mentioned in the history of present illness. All other systems reviewed and are negative. Past Medical History:  Diagnosis Date   Arrhythmia    atrial fibrillation   Asthma    CHF (congestive heart failure) (HCC)    COPD (chronic obstructive pulmonary disease) (HCC)    Coronary artery disease    Depression    Diabetes mellitus without complication (HCC)    Gout    History anabolic steroid use    Hyperlipidemia    Hypertension    Hypogonadism in male    MI (myocardial infarction) (HCC)    Morbid obesity (HCC)    Myocardial infarction (HCC)    Peripheral vascular disease (HCC)    Perirectal abscess    Pleurisy    Sleep apnea    CPAP at night, no oxygen   Varicella    Past Surgical History:  Procedure Laterality Date   ABDOMINAL AORTIC ANEURYSM REPAIR     ACHILLES TENDON SURGERY Left 01/10/2021   Procedure: ACHILLES LENGTHENING/KIDNER;  Surgeon: Rosetta Posner, DPM;  Location: [facility];  Service: Podiatry;  Laterality: Left;   AMPUTATION Left 10/03/2021   Procedure: AMPUTATION BELOW KNEE;  Surgeon: Renford Dills, MD;  Location: [facility];  Service: Vascular;  Laterality: Left;   AMPUTATION Right 05/24/2022   Procedure: AMPUTATION BELOW KNEE;  Surgeon: Annice Needy, MD;  Location: [facility];  Service: General;  Laterality: Right;   AMPUTATION TOE Right 02/10/2016   Procedure: AMPUTATION TOE 3RD TOE;  Surgeon: Gwyneth Revels, DPM;  Location: [facility];  Service: Podiatry;  Laterality: Right;   AMPUTATION TOE Left 02/24/2020   Procedure: AMPUTATION TOE;  Surgeon: Rosetta Posner, DPM;  Location: [facility];  Service: Podiatry;  Laterality: Left;   APPLICATION OF WOUND VAC Left 02/29/2020   Procedure: APPLICATION OF WOUND VAC;  Surgeon: Rosetta Posner, DPM;  Location: [facility];  Service: Podiatry;  Laterality: Left;   BIOPSY  03/15/2023   Procedure: BIOPSY;  Surgeon: Wyline Mood, MD;  Location: [facility] ENDOSCOPY;  Service: Gastroenterology;;   BIOPSY  04/10/2023   Procedure:  BIOPSY;  Surgeon: Norma Fredrickson, Boykin Nearing, MD;  Location: [facility];  Service: Gastroenterology;;   CARDIAC CATHETERIZATION     CARDIOVERSION [name] 02/13/2022   Procedure: CARDIOVERSION;  Surgeon: Lamar Blinks, MD;  Location: [facility];  Service: Cardiovascular;  Laterality: [name];   CARDIOVERSION [name] 04/23/2023   Procedure: CARDIOVERSION;  Surgeon: Antonieta Iba, MD;  Location: [facility];  Service: Cardiovascular;  Laterality: [name];   COLONOSCOPY WITH PROPOFOL [name] 11/18/2015   Procedure: COLONOSCOPY WITH PROPOFOL;  Surgeon: Scot Jun, MD;  Location: [facility] ENDOSCOPY;  Service: Endoscopy;  Laterality: [name];   CORONARY ARTERY BYPASS GRAFT     CORONARY STENT INTERVENTION [name] 02/02/2020   Procedure: CORONARY STENT INTERVENTION;  Surgeon: Marcina Millard, MD;  Location: [facility];  Service: Cardiovascular;  Laterality: [name];   ESOPHAGOGASTRODUODENOSCOPY (EGD) WITH PROPOFOL [name] 03/15/2023   Procedure: ESOPHAGOGASTRODUODENOSCOPY (EGD) WITH PROPOFOL;  Surgeon: Wyline Mood, MD;  Location: [facility] ENDOSCOPY;  Service: Gastroenterology;  Laterality: [name];   FLEXIBLE SIGMOIDOSCOPY [name] 04/10/2023   Procedure: FLEXIBLE SIGMOIDOSCOPY;  Surgeon: Toledo, Boykin Nearing, MD;  Location: [facility];  Service: Gastroenterology;  Laterality: [name];   GASTROSTOMY [name] 03/18/2023   Procedure: INSERTION OF GASTROSTOMY TUBE;  Surgeon: Carolan Shiver, MD;  Location: [facility];  Service: General;  Laterality: [name];   IMPACTION REMOVAL  03/15/2023   Procedure: IMPACTION REMOVAL;  Surgeon: Wyline Mood, MD;  Location: [facility] ENDOSCOPY;  Service: Gastroenterology;;   INCISION AND DRAINAGE Left 08/07/2021   Procedure: INCISION AND DRAINAGE-Partial Calcanectomy;  Surgeon: Rosetta Posner, DPM;  Location: [facility];  Service: Podiatry;  Laterality: Left;   IRRIGATION AND DEBRIDEMENT FOOT Left 02/29/2020   Procedure: IRRIGATION AND DEBRIDEMENT FOOT;  Surgeon: Rosetta Posner, DPM;  Location: [facility];  Service:  Podiatry;  Laterality: Left;   IRRIGATION AND DEBRIDEMENT FOOT Left 02/24/2020   Procedure: IRRIGATION AND DEBRIDEMENT FOOT;  Surgeon: Rosetta Posner, DPM;  Location: [facility];  Service: Podiatry;  Laterality: Left;   KNEE ARTHROSCOPY     LEFT HEART CATH AND CORS/GRAFTS ANGIOGRAPHY [name] 02/02/2020   Procedure: LEFT HEART CATH AND CORS/GRAFTS ANGIOGRAPHY;  Surgeon: Dalia Heading, MD;  Location: [facility];  Service: Cardiovascular;  Laterality: [name];   LOWER EXTREMITY ANGIOGRAPHY Left 02/25/2020   Procedure: Lower Extremity Angiography;  Surgeon: Annice Needy, MD;  Location: [facility];  Service: Cardiovascular;  Laterality: Left;   LOWER EXTREMITY ANGIOGRAPHY Left 01/04/2021   Procedure: LOWER EXTREMITY ANGIOGRAPHY;  Surgeon: Annice Needy, MD;  Location: [facility];  Service: Cardiovascular;  Laterality: Left;   LOWER EXTREMITY ANGIOGRAPHY Right 05/21/2022   Procedure: Lower Extremity Angiography;  Surgeon: Annice Needy, MD;  Location: [facility];  Service: Cardiovascular;  Laterality: Right;   METATARSAL HEAD EXCISION Left 01/10/2021   Procedure: METATARSAL HEAD EXCISION - LEFT 5th;  Surgeon: Rosetta Posner, DPM;  Location: [facility];  Service: Podiatry;  Laterality: Left;   PERIPHERAL VASCULAR CATHETERIZATION Right 01/24/2016   Procedure: Lower Extremity Angiography;  Surgeon: Renford Dills, MD;  Location: [facility];  Service: Cardiovascular;  Laterality: Right;   PERIPHERAL  VASCULAR CATHETERIZATION Right 01/25/2016   Procedure: Lower Extremity Angiography;  Surgeon: Renford Dills, MD;  Location: [facility];  Service: Cardiovascular;  Laterality: Right;   PORTACATH PLACEMENT [name] 03/18/2023   Procedure: INSERTION PORT-A-CATH;  Surgeon: Carolan Shiver, MD;  Location: [facility];  Service: General;  Laterality: [name];   TOE AMPUTATION     TONSILLECTOMY     Social History:  reports that he quit smoking about 8 years ago. His  smoking use included cigarettes. He started smoking about 53 years ago. He has a 22.5 pack-year smoking history. He has never used smokeless tobacco. He reports that he does not currently use alcohol after a past usage of about 3.0 standard drinks of alcohol per week. He reports that he does not use drugs.  Allergies  Allergen Reactions   Penicillins Other (See Comments)    Happened at 68 years old and pt. stated he passed out  He has tolerated amoxicillin/clavulanate and ampicillin/sulbactam   Statins     Other reaction(s): Muscle Pain Causes legs to ache per pt   Metformin And Related Diarrhea    Family History  Problem Relation Age of Onset   Hypertension Father    Coronary artery disease Father    Alcohol abuse Father    Heart failure Brother     Prior to Admission medications   Medication Sig Start Date End Date Taking? Authorizing Provider  acetaminophen (TYLENOL) 325 MG tablet Place 2 tablets (650 mg total) into feeding tube every 6 (six) hours as needed for mild pain (pain score 1-3) (or Fever >/= 101). 03/22/23   Lurene Shadow, MD  amiodarone (PACERONE) 200 MG tablet Take twice a day through 1/7 and then once daily 05/03/23   Wouk, Wilfred Curtis, MD  apixaban (ELIQUIS) 5 MG TABS tablet Place 1 tablet (5 mg total) into feeding tube 2 (two) times daily. 03/22/23   Lurene Shadow, MD  ascorbic acid (VITAMIN C) 500 MG tablet Place 1 tablet (500 mg total) into feeding tube daily. 03/22/23   Lurene Shadow, MD  Cholecalciferol 25 MCG (1000 UT) tablet Place 1 tablet (1,000 Units total) into feeding tube daily. Patient taking differently: Place 2,000 Units into feeding tube daily. 03/22/23   Lurene Shadow, MD  clonazePAM (KLONOPIN) 0.5 MG tablet Place 1 tablet (0.5 mg total) into feeding tube 2 (two) times daily. 03/22/23   Lurene Shadow, MD  Cyanocobalamin (VITAMIN B-12) 5000 MCG TBDP Place 10,000 mcg into feeding tube daily. 03/22/23   Lurene Shadow, MD  diphenoxylate-atropine  (LOMOTIL) 2.5-0.025 MG tablet Place 1 tablet into feeding tube 4 (four) times daily. 05/03/23   Wouk, Wilfred Curtis, MD  ezetimibe (ZETIA) 10 MG tablet Place 1 tablet (10 mg total) into feeding tube at bedtime. 03/22/23   Lurene Shadow, MD  ferrous sulfate 300 (60 Fe) MG/5ML syrup Place 5 mLs (300 mg total) into feeding tube daily. Patient taking differently: Place 6.8 mLs into feeding tube daily. 03/22/23   Lurene Shadow, MD  fiber supplement, BANATROL TF, liquid Place 60 mLs into feeding tube 2 (two) times daily. 05/03/23   Wouk, Wilfred Curtis, MD  finasteride (PROSCAR) 5 MG tablet Take 1 tablet by mouth daily. 02/25/23   [provider]  gabapentin (NEURONTIN) 300 MG capsule Take 1 capsule by mouth at bedtime. 02/14/23   [provider]  HUMALOG 100 UNIT/ML injection Inject into the skin. 05/27/23   [provider]  Infant Care Products [facility]) OINT Apply 1 Application topically daily as needed.  [provider]  insulin glargine (LANTUS) 100 UNIT/ML injection Inject into the skin. 03/27/23   [provider]  levothyroxine (SYNTHROID) 25 MCG tablet Place 1 tablet (25 mcg total) into feeding tube daily at 6 (six) AM. 03/22/23   Lurene Shadow, MD  lidocaine-prilocaine (EMLA) cream Apply 1 Application topically as needed. 05/28/23   [name], MD  loperamide HCl (IMODIUM) 1 MG/7.5ML suspension Place 15 mLs (2 mg total) into feeding tube every 6 (six) hours as needed for diarrhea or loose stools. 05/03/23   Wouk, Wilfred Curtis, MD  metoprolol tartrate (LOPRESSOR) 25 MG tablet Take 0.5 tablets (12.5 mg total) by mouth 2 (two) times daily. 05/03/23   Wouk, Wilfred Curtis, MD  Multiple Vitamins-Minerals (MULTIVITAMIN WITH MINERALS) tablet Take 1 tablet by mouth daily.    [provider]  nitrofurantoin, macrocrystal-monohydrate, (MACROBID) 100 MG capsule Take 100 mg by mouth 2 (two) times daily.    [provider]  nitroGLYCERIN  (NITROSTAT) 0.4 MG SL tablet Place 1 tablet (0.4 mg total) under the tongue every 5 (five) minutes as needed for chest pain. 06/20/22   [name], MD  Nutritional Supplements (FEEDING SUPPLEMENT, GLUCERNA 1.5 CAL,) LIQD Place 237 mLs into feeding tube 6 (six) times daily. 05/03/23   Wouk, Wilfred Curtis, MD  nystatin (MYCOSTATIN/NYSTOP) powder Apply 1 Application topically 2 (two) times daily. Under belly    [provider]  ondansetron (ZOFRAN) 4 MG tablet Place 1 tablet (4 mg total) into feeding tube every 8 (eight) hours as needed for nausea. 05/28/23   [name], MD  pramipexole (MIRAPEX) 1 MG tablet Place 2 tablets (2 mg total) into feeding tube at bedtime. 03/22/23   Lurene Shadow, MD  primidone (MYSOLINE) 250 MG tablet Place 1 tablet (250 mg total) into feeding tube 2 (two) times daily. 03/22/23   Lurene Shadow, MD  tamsulosin (FLOMAX) 0.4 MG CAPS capsule Take 0.4 mg by mouth.    [provider]  Vitamins A & D (VITAMIN A & D) ointment Apply 1 Application topically as needed for dry skin. Apply to stumps    [provider]  Water For Irrigation, Sterile (FREE WATER) SOLN Place 30 mLs into feeding tube 6 (six) times daily. 05/03/23   Kathrynn Running, MD    Physical Exam: Vitals:   06/14/23 0433 06/14/23 0500 06/14/23 0530 06/14/23 0630  BP: 98/62 (!) 89/65 (!) 99/49 (!) 93/52  Pulse: 93 91 94 92  Resp: 19 11 17 17   Temp: 98 F (36.7 C)     TempSrc:      SpO2: 94% 91% 94% 91%  Weight:      Height:       Physical Exam Constitutional:      Appearance: He is obese.  HENT:     Head: Normocephalic and atraumatic.     Nose: Nose normal.     Mouth/Throat:     Mouth: Mucous membranes are dry.  Eyes:     Pupils: Pupils are equal, round, and reactive to light.  Cardiovascular:     Rate and Rhythm: Normal rate and regular rhythm.  Pulmonary:     Effort: Pulmonary effort is normal.  Abdominal:     General: Bowel sounds are normal.   Musculoskeletal:     Comments: S/p bilateral AKA  Neurological:     General: No focal deficit present.  Psychiatric:        Mood and Affect: Mood normal.     Data Reviewed:  There are no new results to review at this time.  CT Angio Chest PE W/Cm &/Or Wo Cm CLINICAL DATA:  High probability pulmonary embolism, original carcinoma, chest pain. * Tracking Code: BO *  EXAM: CT ANGIOGRAPHY CHEST WITH CONTRAST  TECHNIQUE: Multidetector CT imaging of the chest was performed using the standard protocol during bolus administration of intravenous contrast. Multiplanar CT image reconstructions and MIPs were obtained to evaluate the vascular anatomy.  RADIATION DOSE REDUCTION: This exam was performed according to the departmental dose-optimization program which includes automated exposure control, adjustment of the mA and/or kV according to patient size and/or use of iterative reconstruction technique.  CONTRAST:  OMNIPAQUE IOHEXOL 350 MG/ML SOLN  COMPARISON:  03/15/2023  FINDINGS: Cardiovascular: There is adequate opacification of the pulmonary arterial tree. No intraluminal filling defect identified to suggest acute pulmonary embolism through the segmental level. Coronary artery bypass grafting has been performed. Cardiac size is within normal limits. No pericardial effusion. Mild atherosclerotic calcification within thoracic aorta. No aortic aneurysm. Right internal jugular chest port tip is seen within the superior vena cava.  Mediastinum/Nodes: Marked circumferential thickening of the distal esophagus with paraesophageal infiltration is again seen and appears stable since prior examination in keeping with the known underlying esophageal malignancy. No pathologic mediastinal adenopathy. Visualized thyroid is unremarkable.  Lungs/Pleura: Stable 5 mm noncalcified pulmonary nodule within the right upper lobe, axial image # 31/15, indeterminate. Mild emphysema.  Bronchial wall thickening present in keeping with diffuse airway inflammation. No confluent pulmonary infiltrate. No pneumothorax or pleural effusion. Minimal bibasilar atelectasis.  Upper Abdomen: Liver retention gastrostomy catheter in expected position within the distal stomach. Shotty gastrohepatic and right retrocrural adenopathy is stable. No acute abnormality.  Musculoskeletal: No acute bone abnormality. No lytic or blastic bone lesion.  Review of the MIP images confirms the above findings.  IMPRESSION: 1. No pulmonary embolism. No acute intrathoracic pathology identified. 2. Stable marked circumferential thickening of the distal esophagus with paraesophageal infiltration in keeping with the known underlying esophageal malignancy. 3. Stable 5 mm noncalcified pulmonary nodule within the right upper lobe, indeterminate. Close attention on subsequent surveillance imaging is warranted. 4. Stable shotty gastrohepatic and right retrocrural adenopathy.  Aortic Atherosclerosis (ICD10-I70.0) and Emphysema (ICD10-J43.9).  Electronically Signed   By: Helyn Numbers M.D.   On: 06/14/2023 01:59  Lab Results  Component Value Date   WBC 11.8 (H) 06/13/2023   HGB 10.0 (L) 06/13/2023   HCT 31.1 (L) 06/13/2023   MCV 92.6 06/13/2023   PLT 243 06/13/2023   Last metabolic panel Lab Results  Component Value Date   GLUCOSE 110 (H) 06/14/2023   NA 137 06/14/2023   K 5.8 (H) 06/14/2023   CL 100 06/14/2023   CO2 26 06/14/2023   BUN 50 (H) 06/14/2023   CREATININE 1.05 06/14/2023   GFRNONAA >60 06/14/2023   CALCIUM 9.1 06/14/2023   PHOS 4.2 05/01/2023   PROT 7.1 06/14/2023   ALBUMIN 2.7 (L) 06/14/2023   BILITOT 0.5 06/14/2023   ALKPHOS 121 06/14/2023   AST 22 06/14/2023   ALT 36 06/14/2023   ANIONGAP 11 06/14/2023    Assessment and Plan: * Chest pain Pt w/ nonspecific R sided chest pain x 12 hours  Trop neg x 2  EKG sinus tach  CTA negative for PE  Noted prior hx/o STEMI  S/p CABG x 2 2006, PCI with DES to RCA 2021 - symptoms non similar to prior cardiac event  Suspect radiation induced chest wall inflammation as well as esophageal  cancer as likely predominant etiologies for pain as patient reports regular radiation treatment to affected area.  Will monitor for now  Pain control     Hyperkalemia K 5.8 today  Recurrent issue EKG stable  S/p lokelma  Monitor   Malignant neoplasm of lower third of esophagus (HCC) Baseline Distal esophageal cancer with extension into the proximal stomach and metastatic high left paratracheal and gastrohepatic ligament lymph nodes.  On palliative radiation  Followed by Dr. Donneta Romberg  Suspect radiation and esophageal cancer is confounding issue to chest pain   COPD (chronic obstructive pulmonary disease) (HCC) Stable from a resp standpoint  Cont home inhalers    Essential hypertension LLN BP  Titrate BP regimen    PAD (peripheral artery disease) (HCC) S/p bilateral BKA (left 2023, right 2024)  Chronic diastolic CHF (congestive heart failure) (HCC) 2D ECHO 03/2023 w/ EF 55% and grade 1 DD  Mildly dry  IVF hydration  Wt 108.9kg today  Monitor volume status    Persistent atrial fibrillation (HCC) Fairly rate controlled at present  Cont home medications including amiodarone and eliquis    Diarrhea Recent admission 03/2023 w/ noted c diff assd diarrhea- resolved  On lomotil   Perinephric hematoma Noted chronic complex hematoma dating back over a year from 03/2023 imaging-evaluated by Dr. Apolinar Junes w/ urology, nothing that has been drainable thus far, possible underlying renal mass. No active abd pain at present  Will repeat imaging in setting of moderate to severe R sided chest pain  Monitor.  Type 2 diabetes mellitus with obesity (HCC) Blood sugar 110s  SSI  Monitor        Advance Care Planning:   Code Status: Full Code   Consults: None   Family Communication: Wife at the bedside   Severity  of Illness: The appropriate patient status for this patient is OBSERVATION. Observation status is judged to be reasonable and necessary in order to provide the required intensity of service to ensure the patient's safety. The patient's presenting symptoms, physical exam findings, and initial radiographic and laboratory data in the context of their medical condition is felt to place them at decreased risk for further clinical deterioration. Furthermore, it is anticipated that the patient will be medically stable for discharge from the hospital within 2 midnights of admission.   Author: Floydene Flock, MD 06/14/2023 8:10 AM  For on call review [email or web address].

## 2023-06-14 NOTE — Assessment & Plan Note (Addendum)
Pt w/ nonspecific R sided chest pain x 12 hours  Trop neg x 2  EKG sinus tach  CTA negative for PE  Noted prior hx/o STEMI S/p CABG x 2 2006, PCI with DES to RCA 2021 - symptoms non similar to prior cardiac event  Suspect radiation induced chest wall inflammation as well as esophageal cancer as likely predominant etiologies for pain as patient reports regular radiation treatment to affected area.  Will monitor for now  Pain control

## 2023-06-14 NOTE — ED Notes (Signed)
CCMD called and pt placed on tele.

## 2023-06-14 NOTE — Assessment & Plan Note (Signed)
Fairly rate controlled at present  Cont home medications including amiodarone and eliquis

## 2023-06-14 NOTE — Progress Notes (Signed)
Initial Nutrition Assessment  DOCUMENTATION CODES:   Obesity unspecified  INTERVENTION:   -Resume TF via g-tube:   237 ml Glucerna 1.5 6 times daily  70 ml free water flush with each feeding administration  Tube feeding regimen provides 2136 kcal (100% of needs), 118 grams of protein, and 1080 ml of H2O. Total free water: 1920 ml daily   -Oral diet for pleasure; pt cleared for liquids by radiation oncologist  -RD forwarded [facility] to [facility] for continuity of care  NUTRITION DIAGNOSIS:   Increased nutrient needs related to cancer and cancer related treatments as evidenced by estimated needs.  GOAL:   Patient will meet greater than or equal to 90% of their needs  MONITOR:   PO intake, TF tolerance  REASON FOR ASSESSMENT:   Consult Enteral/tube feeding initiation and management  ASSESSMENT:   Pt with medical history significant of atrial fibrillation, asthma, congestive heart failure, COPD, coronary artery disease, depression, diabetes mellitus, gout, hyperlipidemia, hypertension, hypogonadism in male, morbid obesity, peripheral vascular disease, OSA on CPAP at night, esophageal cancer status post chemo and G-tube placement on palliative radiation presenting w/ chest pain.  Patient reports acute right-sided chest pain since 12 hours PTA.  Pt admitted with chest pain.   11/18- s/p Stamm gastrostomy   Pt unavailable at time of visit. RD unable to obtain further nutrition-related history or complete nutrition-focused physical exam at this time.    Per H&P, pt with non specific rt sided chest pain x 12 hours PTA.   Pt with esophageal cancer, currently receiving radiation treatments at cancer center.   Pt familiar to this RD due to multiple prior admissions. Pt recently discharged from hospital on 05/03/23 to [facility]. Case discussed with RD at [facility], who has been following pt recently. Pt remains on home TF regimen of 1 carton of Glucerna 1.5 6 times  daily. Pt also receives flushes of 70 ml before and after each feeding, 30 ml flush with med passes and 6-7 ounces of water BID if unable to drink orally. Per note on 05/30/23, pt was cleared by radiation oncologist for clear liquids as tolerated. Pt consumes liquids such as egg drop soup at home and wife inquired about low sugar oral nutrition supplement shake to help maximize nutrition. Pt receive TF supplies through [employer].   Per prior history, pt ht is 6'4". Used this to help assess nutritional needs.   Reviewed wt hx; pt has experienced a 16.4% wt loss over the past 3 months, which is significant for time frame.   Labs reviewed: K: 5.8, Mg: 2.5, CBGS: 176 (inpatient orders for glycemic control are ).    Diet Order:   Diet Order             Diet Heart Room service appropriate? Yes; Fluid consistency: Thin  Diet effective now                   EDUCATION NEEDS:   No education needs have been identified at this time  Skin:  Skin Assessment: Reviewed RN Assessment  Last BM:  Unknown  Height:   Ht Readings from Last 1 Encounters:  06/13/23 5\' 5"  (1.651 m)    Weight:   Wt Readings from Last 1 Encounters:  06/13/23 108.9 kg    Ideal Body Weight:  75.2 kg (adjusted for bilateral BKAs)  BMI:  Body mass index is 39.95 kg/m.  Estimated Nutritional Needs:   Kcal:  9604-5409  Protein:  125-150 grams  Fluid:  > 2 L    Levada Schilling, RD, LDN, CDCES Registered Dietitian III Certified Diabetes Care and Education Specialist If unable to reach this RD, please use "RD Inpatient" group chat on secure chat between hours of 8am-4 pm daily

## 2023-06-15 ENCOUNTER — Other Ambulatory Visit: Payer: Self-pay

## 2023-06-15 DIAGNOSIS — R079 Chest pain, unspecified: Secondary | ICD-10-CM

## 2023-06-15 DIAGNOSIS — I5032 Chronic diastolic (congestive) heart failure: Secondary | ICD-10-CM | POA: Diagnosis not present

## 2023-06-15 DIAGNOSIS — J439 Emphysema, unspecified: Secondary | ICD-10-CM | POA: Diagnosis not present

## 2023-06-15 DIAGNOSIS — K909 Intestinal malabsorption, unspecified: Secondary | ICD-10-CM | POA: Diagnosis not present

## 2023-06-15 DIAGNOSIS — F4320 Adjustment disorder, unspecified: Secondary | ICD-10-CM | POA: Diagnosis not present

## 2023-06-15 DIAGNOSIS — R197 Diarrhea, unspecified: Secondary | ICD-10-CM

## 2023-06-15 LAB — BASIC METABOLIC PANEL
Anion gap: 6 (ref 5–15)
Anion gap: 7 (ref 5–15)
BUN: 36 mg/dL — ABNORMAL HIGH (ref 8–23)
BUN: 32 mg/dL — ABNORMAL HIGH (ref 8–23)
CO2: 24 mmol/L (ref 22–32)
CO2: 23 mmol/L (ref 22–32)
Calcium: 8.9 mg/dL (ref 8.9–10.3)
Calcium: 9 mg/dL (ref 8.9–10.3)
Chloride: 105 mmol/L (ref 98–111)
Chloride: 104 mmol/L (ref 98–111)
Creatinine, Ser: 1.03 mg/dL (ref 0.61–1.24)
Creatinine, Ser: 1.08 mg/dL (ref 0.61–1.24)
GFR, Estimated: >60 mL/min (ref >60)
GFR, Estimated: >60 mL/min (ref >60)
Glucose, Bld: 124 mg/dL — ABNORMAL HIGH (ref 70–99)
Glucose, Bld: 155 mg/dL — ABNORMAL HIGH (ref 70–99)
Potassium: 5.7 mmol/L — ABNORMAL HIGH (ref 3.5–5.1)
Potassium: 5.6 mmol/L — ABNORMAL HIGH (ref 3.5–5.1)
Sodium: 135 mmol/L (ref 135–145)
Sodium: 134 mmol/L — ABNORMAL LOW (ref 135–145)

## 2023-06-15 LAB — POTASSIUM
Potassium: 5.6 mmol/L — ABNORMAL HIGH (ref 3.5–5.1)
Potassium: 5.3 mmol/L — ABNORMAL HIGH (ref 3.5–5.1)
Potassium: 5.4 mmol/L — ABNORMAL HIGH (ref 3.5–5.1)

## 2023-06-15 LAB — TROPONIN I (HIGH SENSITIVITY)
Troponin I (High Sensitivity): 12 ng/L (ref <18)
Troponin I (High Sensitivity): 12 ng/L (ref <18)

## 2023-06-15 LAB — HIV ANTIBODY (ROUTINE TESTING W REFLEX): HIV Screen 4th Generation wRfx: NONREACTIVE

## 2023-06-15 MED ORDER — PRAMIPEXOLE DIHYDROCHLORIDE 0.25 MG PO TABS
0.2500 mg | ORAL_TABLET | Freq: Every day | ORAL | Status: DC
  Administered 2023-06-15: 0.25 mg
  Filled 2023-06-15: qty 1

## 2023-06-15 MED ORDER — DIPHENOXYLATE-ATROPINE 2.5-0.025 MG PO TABS
1.0000 | ORAL_TABLET | Freq: Four times a day (QID) | ORAL | Status: DC
  Administered 2023-06-15 – 2023-06-16 (×3): 1
  Filled 2023-06-15 (×3): qty 1

## 2023-06-15 MED ORDER — ZINC SULFATE 220 (50 ZN) MG PO CAPS
220.0000 mg | ORAL_CAPSULE | Freq: Every day | ORAL | Status: DC
  Administered 2023-06-15 – 2023-06-16 (×2): 220 mg via ORAL
  Filled 2023-06-15 (×2): qty 1

## 2023-06-15 MED ORDER — LEVOTHYROXINE SODIUM 25 MCG PO TABS
25.0000 ug | ORAL_TABLET | Freq: Every day | ORAL | Status: DC
  Administered 2023-06-16: 25 ug
  Filled 2023-06-15: qty 1

## 2023-06-15 MED ORDER — VITAMIN C 500 MG PO TABS
500.0000 mg | ORAL_TABLET | Freq: Every day | ORAL | Status: DC
  Administered 2023-06-15 – 2023-06-16 (×2): 500 mg
  Filled 2023-06-15 (×2): qty 1

## 2023-06-15 MED ORDER — FUROSEMIDE 10 MG/ML IJ SOLN
40.0000 mg | INTRAMUSCULAR | Status: AC
  Administered 2023-06-15: 40 mg via INTRAVENOUS
  Filled 2023-06-15: qty 4

## 2023-06-15 MED ORDER — FUROSEMIDE 10 MG/ML IJ SOLN: 40.0000 mg | Freq: Once | INTRAMUSCULAR | Status: DC

## 2023-06-15 MED ORDER — FUROSEMIDE 40 MG PO TABS
40.0000 mg | ORAL_TABLET | Freq: Every day | ORAL | Status: DC
  Administered 2023-06-15: 40 mg via ORAL
  Filled 2023-06-15 (×2): qty 1

## 2023-06-15 MED ORDER — GABAPENTIN 300 MG PO CAPS
300.0000 mg | ORAL_CAPSULE | Freq: Every day | ORAL | Status: DC
  Administered 2023-06-15: 300 mg via ORAL
  Filled 2023-06-15: qty 1

## 2023-06-15 MED ORDER — VITAMIN D 25 MCG (1000 UNIT) PO TABS
1000.0000 [IU] | ORAL_TABLET | Freq: Every day | ORAL | Status: DC
  Administered 2023-06-15 – 2023-06-16 (×2): 1000 [IU]
  Filled 2023-06-15 (×2): qty 1

## 2023-06-15 MED ORDER — PRIMIDONE 250 MG PO TABS
250.0000 mg | ORAL_TABLET | Freq: Two times a day (BID) | ORAL | Status: DC
  Administered 2023-06-15 – 2023-06-16 (×3): 250 mg
  Filled 2023-06-15 (×3): qty 1

## 2023-06-15 MED ORDER — METOPROLOL TARTRATE 25 MG PO TABS
12.5000 mg | ORAL_TABLET | Freq: Two times a day (BID) | ORAL | Status: DC
  Administered 2023-06-15 – 2023-06-16 (×3): 12.5 mg via ORAL
  Filled 2023-06-15 (×3): qty 1

## 2023-06-15 MED ORDER — NITROGLYCERIN 0.4 MG SL SUBL
0.4000 mg | SUBLINGUAL_TABLET | SUBLINGUAL | Status: DC | PRN
  Administered 2023-06-15 (×3): 0.4 mg via SUBLINGUAL
  Filled 2023-06-15: qty 1

## 2023-06-15 MED ORDER — NITROFURANTOIN MONOHYD MACRO 100 MG PO CAPS: 100.0000 mg | ORAL_CAPSULE | Freq: Two times a day (BID) | ORAL | Status: DC

## 2023-06-15 MED ORDER — SODIUM ZIRCONIUM CYCLOSILICATE 10 G PO PACK
10.0000 g | PACK | Freq: Every day | ORAL | Status: DC
  Administered 2023-06-15: 10 g via ORAL
  Filled 2023-06-15 (×2): qty 1

## 2023-06-15 MED ORDER — ONDANSETRON HCL 4 MG PO TABS: 4.0000 mg | ORAL_TABLET | Freq: Three times a day (TID) | ORAL | Status: DC | PRN

## 2023-06-15 MED ORDER — DILTIAZEM HCL ER COATED BEADS 180 MG PO CP24
300.0000 mg | ORAL_CAPSULE | Freq: Every day | ORAL | Status: DC
  Administered 2023-06-15 – 2023-06-16 (×2): 300 mg via ORAL
  Filled 2023-06-15 (×2): qty 1

## 2023-06-15 MED ORDER — LOSARTAN POTASSIUM 50 MG PO TABS
50.0000 mg | ORAL_TABLET | Freq: Every day | ORAL | Status: DC
  Administered 2023-06-15: 50 mg via ORAL
  Filled 2023-06-15: qty 1

## 2023-06-15 MED ORDER — LOPERAMIDE HCL 1 MG/7.5ML PO SUSP: 2.0000 mg | Freq: Four times a day (QID) | ORAL | Status: DC | PRN

## 2023-06-15 MED ORDER — MORPHINE SULFATE (PF) 2 MG/ML IV SOLN
2.0000 mg | INTRAVENOUS | Status: AC
  Administered 2023-06-15: 2 mg via INTRAVENOUS
  Filled 2023-06-15: qty 1

## 2023-06-15 MED ORDER — VITAMIN B-12 1000 MCG PO TABS
2000.0000 ug | ORAL_TABLET | Freq: Every day | ORAL | Status: DC
  Administered 2023-06-15 – 2023-06-16 (×2): 2000 ug
  Filled 2023-06-15 (×2): qty 2

## 2023-06-15 MED ORDER — ISOSORBIDE MONONITRATE ER 60 MG PO TB24
60.0000 mg | ORAL_TABLET | Freq: Every day | ORAL | Status: DC
  Administered 2023-06-15 – 2023-06-16 (×2): 60 mg via ORAL
  Filled 2023-06-15 (×2): qty 1

## 2023-06-15 MED ORDER — FINASTERIDE 5 MG PO TABS
5.0000 mg | ORAL_TABLET | Freq: Every day | ORAL | Status: DC
  Administered 2023-06-15 – 2023-06-16 (×2): 5 mg via ORAL
  Filled 2023-06-15 (×2): qty 1

## 2023-06-15 MED ORDER — QUETIAPINE FUMARATE 25 MG PO TABS
25.0000 mg | ORAL_TABLET | Freq: Two times a day (BID) | ORAL | Status: DC
  Administered 2023-06-15 – 2023-06-16 (×3): 25 mg via ORAL
  Filled 2023-06-15 (×3): qty 1

## 2023-06-15 MED ORDER — CLONAZEPAM 0.5 MG PO TABS
0.5000 mg | ORAL_TABLET | Freq: Two times a day (BID) | ORAL | Status: DC
  Administered 2023-06-15 – 2023-06-16 (×3): 0.5 mg
  Filled 2023-06-15 (×3): qty 1

## 2023-06-15 MED ORDER — ADULT MULTIVITAMIN W/MINERALS CH
1.0000 | ORAL_TABLET | Freq: Every day | ORAL | Status: DC
  Administered 2023-06-15 – 2023-06-16 (×2): 1 via ORAL
  Filled 2023-06-15 (×2): qty 1

## 2023-06-15 MED ORDER — BANATROL TF EN LIQD
60.0000 mL | Freq: Two times a day (BID) | ENTERAL | Status: DC
  Administered 2023-06-15: 60 mL

## 2023-06-15 NOTE — Consult Note (Signed)
[facility] Psychiatric Consult Initial  Patient Name: .[name].  MRN: [id number]  DOB: October 19, 1955  Consult Order details:  Orders (From admission, onward)     Start     Ordered   06/15/23 1309  IP CONSULT TO PSYCHIATRY       Comments: Dr. Virgel Manifold aware  Ordering Provider: Delfino Lovett, MD  Provider:  (Not yet assigned)  Question Answer Comment  Location [facility]   Reason for Consult? Decision making capacity/agitation      06/15/23 1309             Mode of Visit: In person    Psychiatry Consult Evaluation  Service Date: June 15, 2023 LOS:  LOS: 1 day  Chief Complaint Capacity eval  Primary Psychiatric Diagnoses  Adjustment disorder unspecified 2.   3.    Assessment  [name]. is a 68 y.o. male admitted with medical history significant of atrial fibrillation, asthma, congestive heart failure, COPD, coronary artery disease, depression, diabetes mellitus, gout, hyperlipidemia, hypertension, hypogonadism in male, morbid obesity, peripheral vascular disease, OSA on CPAP at night, esophageal cancer status post chemo and G-tube placement on palliative radiation presenting w/ chest pain.  Psychiatry was consulted for capacity evaluation as patient wants to go back home and his wife reports that he he is unable to care for self and requested a psychiatric evaluation.  On assessment patient is able to identify his medical problems, identify the need for help at home, identify his conflict with his wife, identify the pros and cons of not having the help.  He agreed to take any home health health that can be offered by the team that will assist him and his wife with his ADLs.  He denies SI/HI/intent/plan.  He remains future oriented of getting prosthetics for both of his legs so that he is able to move around better.  At this point patient is noted to have capacity to make decisions about his living situation and arrangements.      Diagnoses:  Active Hospital problems: Principal Problem:   Chest pain Active Problems:   Type 2 diabetes mellitus with obesity (HCC)   Essential hypertension   PAD (peripheral artery disease) (HCC)   Chronic diastolic CHF (congestive heart failure) (HCC)   Hyperkalemia   Perinephric hematoma   COPD (chronic obstructive pulmonary disease) (HCC)   Diarrhea   Persistent atrial fibrillation (HCC)   Malignant neoplasm of lower third of esophagus (HCC)    Plan   ## Psychiatric Medication Recommendations:  No indication of psychotropic medications at this time  ## Medical Decision Making Capacity:  Patient has capacity to make decisions about his living arrangements  ## Further Work-up:  No other workup is needed at this time   ## Disposition:-- There are no psychiatric contraindications to discharge at this time  ## Behavioral / Environmental: -Utilize compassion and acknowledge the patient's experiences while setting clear and realistic expectations for care.    ## Safety and Observation Level:  - Based on my clinical evaluation, I estimate the patient to be at NO risk of self harm in the current setting. - At this time, we recommend  routine. This decision is based on my review of the chart including patient's history and current presentation, interview of the patient, mental status examination, and consideration of suicide risk including evaluating suicidal ideation, plan, intent, suicidal or self-harm behaviors, risk factors, and protective factors. This judgment is based on our ability to directly  address suicide risk, implement suicide prevention strategies, and develop a safety plan while the patient is in the clinical setting. Please contact our team if there is a concern that risk level has changed.  CSSR Risk Category:C-SSRS RISK CATEGORY: No Risk  Suicide Risk Assessment: Patient has following modifiable risk factors for suicide: lack of access to outpatient mental  health resources, which we are addressing by requested TOC to provided resource list for an outpatient mental health. Patient has following non-modifiable or demographic risk factors for suicide: male gender Patient has the following protective factors against suicide: Cultural, spiritual, or religious beliefs that discourage suicide and no history of suicide attempts  Thank you for this consult request. Recommendations have been communicated to the primary team.  We will sign off at this time.   Verner Chol, MD       History of Present Illness  Alec Yanko. is a 68 y.o. male admitted with medical history significant of atrial fibrillation, asthma, congestive heart failure, COPD, coronary artery disease, depression, diabetes mellitus, gout, hyperlipidemia, hypertension, hypogonadism in male, morbid obesity, peripheral vascular disease, OSA on CPAP at night, esophageal cancer status post chemo and G-tube placement on palliative radiation presenting w/ chest pain.  Psychiatry was consulted for capacity evaluation as patient wants to go back home and his wife reports that he he is unable to care for self and requested a psychiatric evaluation.  On interview patient is awake, alert oriented to self, situation, time and place.  He was able to identify the medical problems that led up to his admission to the hospital.  He did acknowledge that currently his wife is having problems taking care of him at home.  He reports after getting amputation in both lower extremities is difficult for him to navigate in the house.  He reports that he supposed to go for the appointments at [facility] to get prosthetics and then get some physical therapy to learn how to walk with the prosthetics.  He did acknowledge that his wife gets very angry and he is anger outbursts does not make the situation any better.  He denies having any physical aggression.  He is agreeable to talk to a therapist or try a mood stabilizer with  outpatient psychiatrist.  He is agreeing to get some home health help at home.  He is able to express the pros and cons of not getting help at home. Psych ROS:  Depression: Denies feeling sad or hopeless, denies anhedonia, reports having fair energy and motivation to do things, denies SI/HI/plan Anxiety: Denies anxiety Mania (lifetime and current): Denies any racing thoughts, not displaying any pressured speech or grandiose delusions Psychosis: (lifetime and current): Denies auditory/visual hallucinations    Psychiatric and Social History  Psychiatric History:  Information collected from patient  Prev Dx/Sx: None reported Current Psych Provider: None reported Home Meds (current): Klonopin 0.5 mg twice daily, Seroquel 25 mg twice daily Previous Med Trials: None reported Therapy: None reported  Prior Psych Hospitalization: None reported Prior Self Harm: None reported Prior Violence: None reported  Family Psych History: None reported Family Hx suicide: None reported  Social History:  Developmental Hx: Normal Educational Hx: High school Occupational Hx: Psychologist, occupational, retired Armed forces operational officer Hx: None reported Living Situation: Lives with his wife Spiritual Hx: None reported Access to weapons/lethal means: None reported  Substance History Alcohol: Denies  Tobacco: Denies Illicit drugs: Denies Prescription drug abuse: Denies Rehab hx: Denies  Exam Findings  Physical Exam: Reviewed and agree with the  physical findings of the examination done by the medical doctor Vital Signs:  Temp:  [97.8 F (36.6 C)-98.2 F (36.8 C)] 98 F (36.7 C) (02/15 1211) Pulse Rate:  [86-103] 103 (02/15 1211) Resp:  [14-20] 18 (02/15 1211) BP: (101-128)/(59-77) 128/77 (02/15 1211) SpO2:  [92 %-99 %] 97 % (02/15 1211) Blood pressure 128/77, pulse (!) 103, temperature 98 F (36.7 C), temperature source Oral, resp. rate 18, height 5\' 5"  (1.651 m), weight 108.9 kg, SpO2 97%. Body mass index is 39.95  kg/m.    Mental Status Exam: General Appearance: Casual and Fairly Groomed  Orientation:  Full (Time, Place, and Person)  Memory:  Immediate;   Fair Recent;   Fair Remote;   Fair  Concentration:  Concentration: Fair and Attention Span: Fair  Recall:  Fair  Attention  Fair  Eye Contact:  Fair  Speech:  Clear and Coherent  Language:  Fair  Volume:  Normal  Mood: good  Affect:  Appropriate  Thought Process:  Coherent  Thought Content:  Logical  Suicidal Thoughts:  No  Homicidal Thoughts:  No  Judgement:  Fair  Insight:  Fair  Psychomotor Activity:  Normal  Akathisia:  No  Fund of Knowledge:  Fair      Assets:  Manufacturing systems engineer Desire for Improvement Financial Resources/Insurance Resilience  Cognition:  WNL  ADL's:  Impaired  AIMS (if indicated):        Other History   These have been pulled in through the EMR, reviewed, and updated if appropriate.  Family History:  The patient's family history includes Alcohol abuse in his father; Coronary artery disease in his father; Heart failure in his brother; Hypertension in his father.  Medical History: Past Medical History:  Diagnosis Date   Arrhythmia    atrial fibrillation   Asthma    CHF (congestive heart failure) (HCC)    COPD (chronic obstructive pulmonary disease) (HCC)    Coronary artery disease    Depression    Diabetes mellitus without complication (HCC)    Gout    History anabolic steroid use    Hyperlipidemia    Hypertension    Hypogonadism in male    MI (myocardial infarction) (HCC)    Morbid obesity (HCC)    Myocardial infarction (HCC)    Peripheral vascular disease (HCC)    Perirectal abscess    Pleurisy    Sleep apnea    CPAP at night, no oxygen   Varicella     Surgical History: Past Surgical History:  Procedure Laterality Date   ABDOMINAL AORTIC ANEURYSM REPAIR     ACHILLES TENDON SURGERY Left 01/10/2021   Procedure: ACHILLES LENGTHENING/KIDNER;  Surgeon: Rosetta Posner, DPM;   Location: [facility];  Service: Podiatry;  Laterality: Left;   AMPUTATION Left 10/03/2021   Procedure: AMPUTATION BELOW KNEE;  Surgeon: Renford Dills, MD;  Location: [facility];  Service: Vascular;  Laterality: Left;   AMPUTATION Right 05/24/2022   Procedure: AMPUTATION BELOW KNEE;  Surgeon: Annice Needy, MD;  Location: [facility];  Service: General;  Laterality: Right;   AMPUTATION TOE Right 02/10/2016   Procedure: AMPUTATION TOE 3RD TOE;  Surgeon: Gwyneth Revels, DPM;  Location: [facility];  Service: Podiatry;  Laterality: Right;   AMPUTATION TOE Left 02/24/2020   Procedure: AMPUTATION TOE;  Surgeon: Rosetta Posner, DPM;  Location: [facility];  Service: Podiatry;  Laterality: Left;   APPLICATION OF WOUND VAC Left 02/29/2020   Procedure: APPLICATION OF WOUND VAC;  Surgeon: Rosetta Posner, DPM;  Location:  [facility];  Service: Podiatry;  Laterality: Left;   BIOPSY  03/15/2023   Procedure: BIOPSY;  Surgeon: Wyline Mood, MD;  Location: [facility] ENDOSCOPY;  Service: Gastroenterology;;   BIOPSY  04/10/2023   Procedure: BIOPSY;  Surgeon: Norma Fredrickson, Boykin Nearing, MD;  Location: [facility]OSCOPY;  Service: Gastroenterology;;   CARDIAC CATHETERIZATION     CARDIOVERSION [name] 02/13/2022   Procedure: CARDIOVERSION;  Surgeon: Lamar Blinks, MD;  Location: [facility];  Service: Cardiovascular;  Laterality: [name];   CARDIOVERSION [name] 04/23/2023   Procedure: CARDIOVERSION;  Surgeon: Antonieta Iba, MD;  Location: [facility];  Service: Cardiovascular;  Laterality: [name];   COLONOSCOPY WITH PROPOFOL [name] 11/18/2015   Procedure: COLONOSCOPY WITH PROPOFOL;  Surgeon: Scot Jun, MD;  Location: [facility] ENDOSCOPY;  Service: Endoscopy;  Laterality: [name];   CORONARY ARTERY BYPASS GRAFT     CORONARY STENT INTERVENTION [name] 02/02/2020   Procedure: CORONARY STENT INTERVENTION;  Surgeon: Marcina Millard, MD;  Location: [facility];  Service: Cardiovascular;  Laterality: [name];   ESOPHAGOGASTRODUODENOSCOPY (EGD) WITH PROPOFOL [name]  03/15/2023   Procedure: ESOPHAGOGASTRODUODENOSCOPY (EGD) WITH PROPOFOL;  Surgeon: Wyline Mood, MD;  Location: [facility] ENDOSCOPY;  Service: Gastroenterology;  Laterality: [name];   FLEXIBLE SIGMOIDOSCOPY [name] 04/10/2023   Procedure: FLEXIBLE SIGMOIDOSCOPY;  Surgeon: Toledo, Boykin Nearing, MD;  Location: [facility]OS[facility];  Service: Gastroenterology;  Laterality: [name];   GASTROSTOMY [name] 03/18/2023   Procedure: INSERTION OF GASTROSTOMY TUBE;  Surgeon: Carolan Shiver, MD;  Location: [facility];  Service: General;  Laterality: [name];   IMPACTION REMOVAL  03/15/2023   Procedure: IMPACTION REMOVAL;  Surgeon: Wyline Mood, MD;  Location: [facility] ENDOSCOPY;  Service: Gastroenterology;;   INCISION AND DRAINAGE Left 08/07/2021   Procedure: INCISION AND DRAINAGE-Partial Calcanectomy;  Surgeon: Rosetta Posner, DPM;  Location: [facility];  Service: Podiatry;  Laterality: Left;   IRRIGATION AND DEBRIDEMENT FOOT Left 02/29/2020   Procedure: IRRIGATION AND DEBRIDEMENT FOOT;  Surgeon: Rosetta Posner, DPM;  Location: [facility];  Service: Podiatry;  Laterality: Left;   IRRIGATION AND DEBRIDEMENT FOOT Left 02/24/2020   Procedure: IRRIGATION AND DEBRIDEMENT FOOT;  Surgeon: Rosetta Posner, DPM;  Location: [facility];  Service: Podiatry;  Laterality: Left;   KNEE ARTHROSCOPY     LEFT HEART CATH AND CORS/GRAFTS ANGIOGRAPHY [name] 02/02/2020   Procedure: LEFT HEART CATH AND CORS/GRAFTS ANGIOGRAPHY;  Surgeon: Dalia Heading, MD;  Location: [facility];  Service: Cardiovascular;  Laterality: [name];   LOWER EXTREMITY ANGIOGRAPHY Left 02/25/2020   Procedure: Lower Extremity Angiography;  Surgeon: Annice Needy, MD;  Location: [facility];  Service: Cardiovascular;  Laterality: Left;   LOWER EXTREMITY ANGIOGRAPHY Left 01/04/2021   Procedure: LOWER EXTREMITY ANGIOGRAPHY;  Surgeon: Annice Needy, MD;  Location: [facility];  Service: Cardiovascular;  Laterality: Left;   LOWER EXTREMITY ANGIOGRAPHY Right 05/21/2022   Procedure:  Lower Extremity Angiography;  Surgeon: Annice Needy, MD;  Location: [facility];  Service: Cardiovascular;  Laterality: Right;   METATARSAL HEAD EXCISION Left 01/10/2021   Procedure: METATARSAL HEAD EXCISION - LEFT 5th;  Surgeon: Rosetta Posner, DPM;  Location: [facility];  Service: Podiatry;  Laterality: Left;   PERIPHERAL VASCULAR CATHETERIZATION Right 01/24/2016   Procedure: Lower Extremity Angiography;  Surgeon: Renford Dills, MD;  Location: [facility];  Service: Cardiovascular;  Laterality: Right;   PERIPHERAL VASCULAR CATHETERIZATION Right 01/25/2016   Procedure: Lower Extremity Angiography;  Surgeon: Renford Dills, MD;  Location: [facility];  Service: Cardiovascular;  Laterality:  Right;   PORTACATH PLACEMENT [name] 03/18/2023   Procedure: INSERTION PORT-A-CATH;  Surgeon: Carolan Shiver, MD;  Location: [facility];  Service: General;  Laterality: [name];   TOE AMPUTATION     TONSILLECTOMY       Medications:   Current Facility-Administered Medications:    acetaminophen (TYLENOL) tablet 650 mg, 650 mg, Oral, Q4H PRN, Floydene Flock, MD   apixaban Everlene Balls) tablet 5 mg, 5 mg, Per Tube, BID, Floydene Flock, MD, 5 mg at 06/15/23 1140   feeding supplement (GLUCERNA 1.5 CAL) liquid 237 mL, 237 mL, Per Tube, 6 X Daily, Floydene Flock, MD, 237 mL at 06/15/23 1142   free water 140 mL, 140 mL, Per Tube, 6 X Daily, Lindajo Royal V, MD, 140 mL at 06/15/23 1142   nitroGLYCERIN (NITROSTAT) SL tablet 0.4 mg, 0.4 mg, Sublingual, Q5 min PRN, Manuela Schwartz, NP, 0.4 mg at 06/15/23 0651   ondansetron (ZOFRAN) injection 4 mg, 4 mg, Intravenous, Q6H PRN, Floydene Flock, MD   QUEtiapine (SEROQUEL) tablet 25 mg, 25 mg, Oral, BID, Delfino Lovett, MD, 25 mg at 06/15/23 1141  Allergies: Allergies  Allergen Reactions   Penicillins Other (See Comments)    Happened at 68 years old and pt. stated he passed out  He has tolerated amoxicillin/clavulanate and  ampicillin/sulbactam   Statins     Other reaction(s): Muscle Pain Causes legs to ache per pt   Metformin And Related Diarrhea    Verner Chol, MD

## 2023-06-15 NOTE — Progress Notes (Signed)
Pt is refusing cardiac monitoring, vital signs and due 6AM feed. Pt is starting to get confused and restless. NP Jon Billings made aware via secure chat. Pt denies chest pain or difficulty of breathing. Ensure frequent rounding.

## 2023-06-15 NOTE — TOC Progression Note (Addendum)
Transition of Care (TOC) - Progression Note    Patient Details  Name: [name]. MRN: [id number] Date of Birth: 1955/08/09  [facility]) CM/SW Contact  Hetty Ely, RN Phone Number: 06/15/2023, 2:17 PM  Clinical Narrative: Per Attending patient to discharge home tomorrow, 06/16/23 via EMS. [facility] will resume PT/OT services per Lurline Del.         Expected Discharge Plan and Services                                               Social Determinants of Health (SDOH) Interventions SDOH Screenings   Food Insecurity: No Food Insecurity (06/14/2023)  Housing: Low Risk  (06/14/2023)  Transportation Needs: No Transportation Needs (06/14/2023)  Utilities: Not At Risk (06/14/2023)  Depression (PHQ2-9): Low Risk  (11/21/2021)  Financial Resource Strain: Low Risk  (09/28/2022)   Received from [facility], [facility], [facility]  Social Connections: Socially Isolated (06/14/2023)  Tobacco Use: Medium Risk (06/14/2023)    Readmission Risk Interventions    02/13/2022    4:57 PM 09/29/2021   11:12 AM  Readmission Risk Prevention Plan  Transportation Screening Complete Complete  PCP or Specialist Appt within 3-5 Days  Complete  HRI or Home Care Consult  Complete  Social Work Consult for Recovery Care Planning/Counseling  Complete  Palliative Care Screening  Not Applicable  Medication Review Oceanographer) Complete Complete  PCP or Specialist appointment within 3-5 days of discharge Complete   HRI or Home Care Consult Complete   SW Recovery Care/Counseling Consult Complete   Palliative Care Screening Not Applicable   Skilled Nursing Facility Not Applicable

## 2023-06-15 NOTE — Progress Notes (Signed)
1      PROGRESS NOTE    [employer]c [employer]  ZOX:[id number] DOB: 08-29-1955 DOA: 06/14/2023 PCP: [name], MD    Brief Narrative:    68 y.o. male with medical history significant of atrial fibrillation, asthma, congestive heart failure, COPD, coronary artery disease, depression, diabetes mellitus, gout, hyperlipidemia, hypertension, hypogonadism in male, morbid obesity, peripheral vascular disease, OSA on CPAP at night, esophageal cancer status post chemo and G-tube placement on palliative radiation presenting w/ chest pain   2/15: Treatment of hyperkalemia with Lasix and Lokelma.  Palliative care and psychiatry consult   Assessment & Plan:   Principal Problem:   Chest pain Active Problems:   Hyperkalemia   Malignant neoplasm of lower third of esophagus (HCC)   Essential hypertension   COPD (chronic obstructive pulmonary disease) (HCC)   PAD (peripheral artery disease) (HCC)   Chronic diastolic CHF (congestive heart failure) (HCC)   Type 2 diabetes mellitus with obesity (HCC)   Perinephric hematoma   Diarrhea   Persistent atrial fibrillation (HCC)  * Chest pain Pt w/ nonspecific R sided chest pain x 12 hours  Trop neg x 2  EKG sinus tach  CTA negative for PE  Noted prior hx/o STEMI S/p CABG x 2 2006, PCI with DES to RCA 2021 - symptoms non similar to prior cardiac event  Suspect radiation induced chest wall inflammation as well as esophageal cancer as likely predominant etiologies for pain as patient reports regular radiation treatment to affected area.  Will monitor for now  Pain control as needed    Hyperkalemia K 5.8 > 6.1> 5.7> 5.6 Recurrent issue EKG stable  S/p lokelma.  I have ordered 1 more dose of Lokelma and Lasix Monitor potassium Telemetry   Malignant neoplasm of lower third of esophagus (HCC) Baseline Distal esophageal cancer with extension into the proximal stomach and metastatic high left paratracheal and gastrohepatic ligament lymph nodes.   On palliative radiation  Followed by Dr. Donneta Romberg  Suspect radiation and esophageal cancer is confounding issue to chest pain  -Palliative care consult   COPD (chronic obstructive pulmonary disease) (HCC) Stable from a resp standpoint  Cont home inhalers    Essential hypertension Controlled    PAD (peripheral artery disease) (HCC) S/p bilateral BKA (left 2023, right 2024)   Chronic diastolic CHF (congestive heart failure) (HCC) 2D ECHO 03/2023 w/ EF 55% and grade 1 DD  Mildly dry  Continue hydration  Monitor volume status      Persistent atrial fibrillation (HCC) Fairly rate controlled at present  Cont home medications including Cardizem, metoprolol and eliquis      Diarrhea Recent admission 03/2023 w/ noted c diff assd diarrhea- resolved  On lomotil    Perinephric hematoma Noted chronic complex hematoma dating back over a year from 03/2023 imaging-evaluated by Dr. Apolinar Junes w/ urology, nothing that has been drainable thus far, possible underlying renal mass. No active abd pain at present  Monitor.   Type 2 diabetes mellitus with obesity (HCC) Blood sugar control SSI  Monitor   Acute metabolic encephalopathy/agitation Patient wanted to leave this morning AMA and refused lab work.  He also refused to see me and wanted to see only Dr. Judithann Sheen After discussion with family.  His pastor came at bedside and helped him calm down He has agreed to stay.  He also allowed for lab work and medications to be given. Seroquel added for now.  Overall difficult home situation.  He needs lot of care and family  is struggling.  Ideally he needs long-term care/24/7 care at home.  Requires Hoyer lift for any movement at home Evaluated by psychiatry.  He does have decision-making capacity per them   DVT prophylaxis: Eliquis  apixaban (ELIQUIS) tablet 5 mg     Code Status: Full code Family Communication: Discussed with wife over phone.  Pastor at bedside updated Disposition Plan:  Likely discharge tomorrow although family is not very happy about him coming back home   Consultants:  Psychiatry    Subjective:  Was very agitated this morning, refused lab work and also refused to see me and wanted to see only Dr. Judithann Sheen in the office.  After discussion with family and pastor involvement he has come down and agreed to get treated including lab work.  He denies any more chest pain but feels frustrated and tired of his situation.  He knows his family is not able to manage him at home  Objective: Vitals:   06/15/23 0636 06/15/23 0644 06/15/23 0843 06/15/23 1211  BP: 123/74 123/71 123/77 128/77  Pulse: 92 96 95 (!) 103  Resp:   18 18  Temp:   97.8 F (36.6 C) 98 F (36.7 C)  TempSrc:   Oral Oral  SpO2: 96% 94% 98% 97%  Weight:      Height:        Intake/Output Summary (Last 24 hours) at 06/15/2023 1433 Last data filed at 06/15/2023 1019 Gross per 24 hour  Intake --  Output 950 ml  Net -950 ml   Filed Weights   06/13/23 2233  Weight: 108.9 kg    Examination:  General exam: Appears calm and comfortable, morbidly obese Respiratory system: Clear to auscultation. Respiratory effort normal. Cardiovascular system: S1 & S2 heard, RRR. No JVD, murmurs, rubs, gallops or clicks. No pedal edema. Gastrointestinal system: Abdomen is soft, benign Central nervous system: Alert and oriented. No focal neurological deficits. Extremities: Bilateral AKA Skin: No rashes, lesions or ulcers Psychiatry: Judgement and insight appear normal. Mood & affect appropriate.     Data Reviewed: I have personally reviewed following labs and imaging studies  CBC: Recent Labs  Lab 06/12/23 0941 06/13/23 2303  WBC 9.9 11.8*  HGB 10.5* 10.0*  HCT 34.6* 31.1*  MCV 97.5 92.6  PLT 267 243   Basic Metabolic Panel: Recent Labs  Lab 06/13/23 2303 06/14/23 0022 06/14/23 2120 06/15/23 0623 06/15/23 1229  NA 135 137 133* 135 134*  K 6.0* 5.8* 6.1* 5.7* 5.6*  CL 100 100 100 105  104  CO2 24 26 24 24 23   GLUCOSE 120* 110* 180* 124* 155*  BUN 53* 50* 39* 36* 32*  CREATININE 1.02 1.05 0.91 1.03 1.08  CALCIUM 9.1 9.1 8.8* 8.9 9.0   GFR: Estimated Creatinine Clearance: 74.5 mL/min (by C-G formula based on SCr of 1.08 mg/dL). Liver Function Tests: Recent Labs  Lab 06/14/23 0022  AST 22  ALT 36  ALKPHOS 121  BILITOT 0.5  PROT 7.1  ALBUMIN 2.7*    Sepsis Labs: Recent Labs  Lab 06/14/23 0358 06/14/23 0619  LATICACIDVEN 0.9 0.6    Recent Results (from the past 240 hours)  Culture, blood (routine x 2)     Status: None (Preliminary result)   Collection Time: 06/14/23  3:50 AM   Specimen: BLOOD  Result Value Ref Range Status   Specimen Description BLOOD RIGHT ARM  Final   Special Requests   Final    BOTTLES DRAWN AEROBIC AND ANAEROBIC Blood Culture results may not be  optimal due to an inadequate volume of blood received in culture bottles   Culture   Final    NO GROWTH 1 DAY Performed at [facility], [address] [place], [place] [address]    Report Status PENDING  Incomplete  Culture, blood (routine x 2)     Status: None (Preliminary result)   Collection Time: 06/14/23  3:58 AM   Specimen: BLOOD  Result Value Ref Range Status   Specimen Description BLOOD RIGHT ARM  Final   Special Requests   Final    BOTTLES DRAWN AEROBIC AND ANAEROBIC Blood Culture results may not be optimal due to an inadequate volume of blood received in culture bottles   Culture   Final    NO GROWTH 1 DAY Performed at [facility], [address] [place], [place] [address]    Report Status PENDING  Incomplete         Radiology Studies: CT ABDOMEN PELVIS W CONTRAST Result Date: 06/14/2023 CLINICAL DATA:  Abdominal pain, acute, nonlocalized. Esophageal neoplasm history EXAM: CT ABDOMEN AND PELVIS WITH CONTRAST TECHNIQUE: Multidetector CT imaging of the abdomen and pelvis was performed using the standard protocol following bolus administration of  intravenous contrast. RADIATION DOSE REDUCTION: This exam was performed according to the departmental dose-optimization program which includes automated exposure control, adjustment of the mA and/or kV according to patient size and/or use of iterative reconstruction technique. CONTRAST:  OMNIPAQUE IOHEXOL 300 MG/ML  SOLN COMPARISON:  CT abdomen pelvis 03/28/2023, CT abdomen pelvis 08/13/2022 FINDINGS: Lower chest: Bilateral lower lobe atelectasis. Distal esophageal and gastroesophageal wall thickening. No acute abnormality. Hepatobiliary: No focal liver abnormality. No gallstones, gallbladder wall thickening, or pericholecystic fluid. No biliary dilatation. Pancreas: No focal lesion. Normal pancreatic contour. No surrounding inflammatory changes. No main pancreatic ductal dilatation. Spleen: Normal in size without focal abnormality. Adrenals/Urinary Tract: No adrenal nodule bilaterally. Bilateral kidneys enhance symmetrically. Right UE with the left thickening. A densely lesions likely represent simple renal cysts. Simple renal cysts, in the absence of clinically indicated signs/symptoms, require no independent follow-up. Question developing heterogeneous enhancing renal mass measuring 5 cm (2:29). No hydronephrosis. No hydroureter. The urinary bladder is unremarkable. Stomach/Bowel: Gastrostomy tube in appropriate position with inflated balloon within the gastric lumen. Stomach is within normal limits. No evidence of bowel wall thickening or dilatation. Colonic diverticulosis. The appendix is not definitely identified with no inflammatory changes in the right lower quadrant to suggest acute appendicitis. Vascular/Lymphatic: No abdominal aorta or iliac aneurysm. Severe atherosclerotic plaque of the aorta and its branches. Prominent but nonenlarged retroperitoneal lymph nodes. No abdominal, pelvic, or inguinal lymphadenopathy. Reproductive: Prostate is unremarkable. Other: Trace perihepatic high density free  fluid. No intraperitoneal free gas. Grossly stable in size 13 x 4 x 9cm (from 11.5 x 6 cm) perinephric abscess formation that is inseparable from the right psoas muscle and renal parenchyma. Abscess likely centered within the psoas muscle and abutting the kidney. Musculoskeletal: No abdominal wall hernia or abnormality. No suspicious lytic or blastic osseous lesions. No acute displaced fracture. Multilevel degenerative changes of the spine. L3-L4 and L4-L5 posterior disc osteophyte complex formation. IMPRESSION: 1. Grossly stable in size 13 x 4 cm (from 11.5 x 6 cm) perinephric abscess formation that is inseparable from the right psoas muscle and renal parenchyma. Abscess likely centered within the psoas muscle and abutting the kidney. 2. Trace perihepatic high density free fluid. 3. Right urothelial thickening suggestive of infection. Correlate with urinalysis. 4. Question developing heterogeneous enhancing renal mass  measuring 5 cm. When the patient is clinically stable and able to follow directions and hold their breath (preferably as an outpatient) further evaluation with dedicated MRI renal protocol should be considered. 5. Distal esophageal and gastroesophageal wall thickening. Findings consistent with known esophageal malignancy. 6. Colonic diverticulosis with no acute diverticulitis. 7.  Aortic Atherosclerosis (ICD10-I70.0). Electronically Signed   By: Tish Frederickson M.D.   On: 06/14/2023 17:16   CT Angio Chest PE W/Cm &/Or Wo Cm Result Date: 06/14/2023 CLINICAL DATA:  High probability pulmonary embolism, original carcinoma, chest pain. * Tracking Code: BO * EXAM: CT ANGIOGRAPHY CHEST WITH CONTRAST TECHNIQUE: Multidetector CT imaging of the chest was performed using the standard protocol during bolus administration of intravenous contrast. Multiplanar CT image reconstructions and MIPs were obtained to evaluate the vascular anatomy. RADIATION DOSE REDUCTION: This exam was performed according to the  departmental dose-optimization program which includes automated exposure control, adjustment of the mA and/or kV according to patient size and/or use of iterative reconstruction technique. CONTRAST:  OMNIPAQUE IOHEXOL 350 MG/ML SOLN COMPARISON:  03/15/2023 FINDINGS: Cardiovascular: There is adequate opacification of the pulmonary arterial tree. No intraluminal filling defect identified to suggest acute pulmonary embolism through the segmental level. Coronary artery bypass grafting has been performed. Cardiac size is within normal limits. No pericardial effusion. Mild atherosclerotic calcification within thoracic aorta. No aortic aneurysm. Right internal jugular chest port tip is seen within the superior vena cava. Mediastinum/Nodes: Marked circumferential thickening of the distal esophagus with paraesophageal infiltration is again seen and appears stable since prior examination in keeping with the known underlying esophageal malignancy. No pathologic mediastinal adenopathy. Visualized thyroid is unremarkable. Lungs/Pleura: Stable 5 mm noncalcified pulmonary nodule within the right upper lobe, axial image # 31/15, indeterminate. Mild emphysema. Bronchial wall thickening present in keeping with diffuse airway inflammation. No confluent pulmonary infiltrate. No pneumothorax or pleural effusion. Minimal bibasilar atelectasis. Upper Abdomen: Liver retention gastrostomy catheter in expected position within the distal stomach. Shotty gastrohepatic and right retrocrural adenopathy is stable. No acute abnormality. Musculoskeletal: No acute bone abnormality. No lytic or blastic bone lesion. Review of the MIP images confirms the above findings. IMPRESSION: 1. No pulmonary embolism. No acute intrathoracic pathology identified. 2. Stable marked circumferential thickening of the distal esophagus with paraesophageal infiltration in keeping with the known underlying esophageal malignancy. 3. Stable 5 mm noncalcified pulmonary  nodule within the right upper lobe, indeterminate. Close attention on subsequent surveillance imaging is warranted. 4. Stable shotty gastrohepatic and right retrocrural adenopathy. Aortic Atherosclerosis (ICD10-I70.0) and Emphysema (ICD10-J43.9). Electronically Signed   By: Helyn Numbers M.D.   On: 06/14/2023 01:59   DG Chest Port 1 View Result Date: 06/13/2023 CLINICAL DATA:  Chest pain, head and neck cancer EXAM: PORTABLE CHEST 1 VIEW COMPARISON:  05/22/2023 FINDINGS: 2 frontal views of the chest demonstrate a stable right chest wall port. Postsurgical changes from median sternotomy. The cardiac silhouette is unremarkable. Stable ectasia of the thoracic aorta. No airspace disease, effusion, or pneumothorax. No acute bony abnormalities. IMPRESSION: 1. Stable chest, no acute process. Electronically Signed   By: Sharlet Salina M.D.   On: 06/13/2023 23:09        Scheduled Meds:  apixaban  5 mg Per Tube BID   feeding supplement (GLUCERNA 1.5 CAL)  237 mL Per Tube 6 X Daily   free water  140 mL Per Tube 6 X Daily   QUEtiapine  25 mg Oral BID   sodium zirconium cyclosilicate  10 g Oral Daily  Continuous Infusions:   LOS: 1 day    Time spent: 35 minutes    Alec Lovett, MD [facility] Pager [phone]  If 7PM-7AM, please contact night-coverage [email or web address]  06/15/2023, 2:33 PM

## 2023-06-15 NOTE — Progress Notes (Signed)
       CROSS COVER NOTE  NAME: [name] MRN: [id number] DOB : [birth year] ATTENDING PHYSICIAN: Delfino Lovett, MD    Date of Service   06/15/2023   HPI/Events of Note   Admitted with chest pain Lengthy medical history Nurse reports 11/10 central chest pain.  Has been mildly agitated this shift and was refusing tele Review of labs patient with K 6.1 not reported, not treated  Interventions   Assessment/Plan:    06/15/2023    6:44 AM 06/15/2023    6:36 AM 06/14/2023   11:19 PM  Vitals with BMI  Systolic 123 123 409  Diastolic 71 74 59  Pulse 96 92 88   EKG - NO STE - SR with RBBB, L AFB bifasicular block - no evidence of hyperkalemia Stat troponin Stat chem panel Prn nitroglycerin ordered SL Morphine 2 mg x 1       Donnie Mesa NP [facility] Cross Cover 7pm-7am - check amion for availability Pager [phone]

## 2023-06-15 NOTE — Progress Notes (Signed)
Pt complained of chest pain, pain scale 11 out of 10. Pt verbalized, "it's in the center of my heart". NP Jon Billings made aware via secure chat. 12L EKG done. New orders made. 2mg  IV Morphine given. 3 doses of Nitroglycerin SL given. Latest pain scale, 8/10. Pt is still refusing cardiac monitoring. Pt is banging the Tele box on the bed saying "It's just a toy".

## 2023-06-15 NOTE — Progress Notes (Signed)
AMA paperwork signed. Pt assisted in calling wife for transportation home

## 2023-06-15 NOTE — Progress Notes (Signed)
PT Cancellation Note  Patient Details Name: [name]. MRN: [id number] DOB: 04-18-1956   Cancelled Treatment:    Reason Eval/Treat Not Completed: PT screened, no needs identified, will sign off. Patient has all needed equipment at home.  Planning to discharge in am with [facility] services ( already set up).    [name] 06/15/2023, 2:35 PM

## 2023-06-15 NOTE — Progress Notes (Signed)
Pt refusing assessment and all care this morning. Pt asking to leave. Dr. Sherryll Burger aware. Delivering AMA papers to bedside for signature.

## 2023-06-15 NOTE — Progress Notes (Signed)
Spoke with wife [name] on the phone. She states she will not come pick up the patient who has signed AMA papers. She states "you don't know what I've been thorugh, he is killing me." [name] asked to speak to her husband on the phone and proceeded to speak loudly enough I could hear her from the doorway through the phone. She stated he was "killing her when he acts like this" and "you don't care about my health and letting me rest." This RN asked patient if he felt safe at home and cared for appropriately; he stated he does. Patient asked if brother could come get him and patient's wife states no, then when I asked who can come get him, she states she will call the brother Minerva Areola. Dr. Sherryll Burger and primary RN aware.   Spoke with wife at length r/t her statement that she needs rest. [facility] consult ordered per Dr. Sherryll Burger. Patient is alert, oriented and able to make the decision for himself to leave AMA.

## 2023-06-15 NOTE — Progress Notes (Signed)
OT Cancellation Note  Patient Details Name: [name]. MRN: [id number] DOB: 09-22-1955   Cancelled Treatment:    Reason Eval/Treat Not Completed: OT screened, no needs identified, will sign off. OT consult received, chart reviewed. Patient has all needed equipment at home. Planning to discharge in am with [facility] services (already set up).   Gerrie Nordmann 06/15/2023, 3:47 PM

## 2023-06-15 NOTE — Discharge Instructions (Signed)
Discharge instructions:   Call office if you have any of the following:  headache, visual changes, fever >101.0 F, chills, breast concerns (engorgement, mastitis) excessive vaginal bleeding, incision drainage or problems, leg pain or redness, depression or any other concerns.   Activity: Do not lift > 10 lbs for 6 weeks.  No intercourse or tampons for 6 weeks.  No driving for 1-2 weeks or while taking pain medication. No strenuous activity or heavy lifting for 6 weeks.  No swimming pools, hot tubs or tub baths- showers only.    It is normal to bleed for up to 6 weeks. You should not soak through more than 1 pad in 1 hour.   Continue prenatal vitamin. Increase calories and fluids while breastfeeding.    For concerns about your baby, please call your pediatrician  Postpartum blues (feelings of happy one minute and sad another minute) are normal for the first few weeks but if it gets worse let your doctor know.              Outpatient Mental Health Providers and Psychiatrists    [facility]   [address]   [place], [place] [address]   Phone number: [phone]   Fax number: [phone]      [facility]   [address]   [place], [place] [address]   Phone number: [phone]   Fax number: [phone]      [facility]   [address] [place], [place] [address]   [phone]      [facility]   [address] [place], [place] [address]   [phone] - Open until 7:30   [email or web address]      [facility]     [address] [place], [place], [place] [address]   [phone]   [email or web address]      [facility]   [address],  [place], [place] [address]   [phone] - Closes at 5:00PM   Or   [address] [place], [place] [address]   [phone] - Closes at 5:00PM    [email or web address]       [name]   [address] [place], [place] [address]   [phone] - Closes at 5:00PM   Or   [address] [place], [place] [address]   [phone] - Closes at 5:00PM   [email or web address]   [address] [place], [place] [address]   [phone]   [email or web address]      [facility]   [address], [place], [place] [address]   [phone]      [facility]    [address] [place], [place] [address]   [phone] - Closes at 7:00PM   [email or web address]   [place], [place], [place] [address]   [phone]      [facility]   [address] [place], [place],  [address]   [phone] - 8:00AM-5:30      [facility]   [address], [place],  [address]   [phone]      [place] (Psychiatrist)    [address] [place], [place]    [phone] - Closes at 6:00PM   [email or web address]  Dr. Lew Dawes, Psychiatrist   [address] [place], [place] [address]   [phone]

## 2023-06-15 NOTE — Plan of Care (Signed)
   Problem: Education: Goal: Understanding of cardiac disease, CV risk reduction, and recovery process will improve Outcome: Progressing Goal: Individualized Educational Video(s) Outcome: Progressing   Problem: Activity: Goal: Ability to tolerate increased activity will improve Outcome: Progressing   Problem: Cardiac: Goal: Ability to achieve and maintain adequate cardiovascular perfusion will improve Outcome: Progressing   Problem: Health Behavior/Discharge Planning: Goal: Ability to safely manage health-related needs after discharge will improve Outcome: Progressing   Problem: Education: Goal: Knowledge of General Education information will improve Description: Including pain rating scale, medication(s)/side effects and non-pharmacologic comfort measures Outcome: Progressing   Problem: Health Behavior/Discharge Planning: Goal: Ability to manage health-related needs will improve Outcome: Progressing   Problem: Clinical Measurements: Goal: Ability to maintain clinical measurements within normal limits will improve Outcome: Progressing Goal: Will remain free from infection Outcome: Progressing Goal: Diagnostic test results will improve Outcome: Progressing Goal: Respiratory complications will improve Outcome: Progressing Goal: Cardiovascular complication will be avoided Outcome: Progressing   Problem: Activity: Goal: Risk for activity intolerance will decrease Outcome: Progressing   Problem: Nutrition: Goal: Adequate nutrition will be maintained Outcome: Progressing   Problem: Coping: Goal: Level of anxiety will decrease Outcome: Progressing   Problem: Elimination: Goal: Will not experience complications related to bowel motility Outcome: Progressing Goal: Will not experience complications related to urinary retention Outcome: Progressing   Problem: Pain Managment: Goal: General experience of comfort will improve and/or be controlled Outcome: Progressing    Problem: Safety: Goal: Ability to remain free from injury will improve Outcome: Progressing   Problem: Skin Integrity: Goal: Risk for impaired skin integrity will decrease Outcome: Progressing

## 2023-06-16 DIAGNOSIS — R079 Chest pain, unspecified: Secondary | ICD-10-CM | POA: Diagnosis not present

## 2023-06-16 DIAGNOSIS — J439 Emphysema, unspecified: Secondary | ICD-10-CM | POA: Diagnosis not present

## 2023-06-16 DIAGNOSIS — E875 Hyperkalemia: Secondary | ICD-10-CM

## 2023-06-16 DIAGNOSIS — C155 Malignant neoplasm of lower third of esophagus: Secondary | ICD-10-CM

## 2023-06-16 LAB — CBC
HCT: 30.1 % — ABNORMAL LOW (ref 39.0–52.0)
Hemoglobin: 9.4 g/dL — ABNORMAL LOW (ref 13.0–17.0)
MCH: 29.7 pg (ref 26.0–34.0)
MCHC: 31.2 g/dL (ref 30.0–36.0)
MCV: 95 fL (ref 80.0–100.0)
Platelets: 227 10*3/uL (ref 150–400)
RBC: 3.17 MIL/uL — ABNORMAL LOW (ref 4.22–5.81)
RDW: 16.8 % — ABNORMAL HIGH (ref 11.5–15.5)
WBC: 7.1 10*3/uL (ref 4.0–10.5)
nRBC: 0 % (ref 0.0–0.2)

## 2023-06-16 LAB — POTASSIUM
Potassium: 4.6 mmol/L (ref 3.5–5.1)
Potassium: 4.7 mmol/L (ref 3.5–5.1)

## 2023-06-16 LAB — BASIC METABOLIC PANEL
Anion gap: 9 (ref 5–15)
Anion gap: 8 (ref 5–15)
BUN: 37 mg/dL — ABNORMAL HIGH (ref 8–23)
BUN: 38 mg/dL — ABNORMAL HIGH (ref 8–23)
CO2: 21 mmol/L — ABNORMAL LOW (ref 22–32)
CO2: 23 mmol/L (ref 22–32)
Calcium: 8.7 mg/dL — ABNORMAL LOW (ref 8.9–10.3)
Calcium: 8.5 mg/dL — ABNORMAL LOW (ref 8.9–10.3)
Chloride: 105 mmol/L (ref 98–111)
Chloride: 102 mmol/L (ref 98–111)
Creatinine, Ser: 1.05 mg/dL (ref 0.61–1.24)
Creatinine, Ser: 1.13 mg/dL (ref 0.61–1.24)
GFR, Estimated: >60 mL/min (ref >60)
GFR, Estimated: >60 mL/min (ref >60)
Glucose, Bld: 169 mg/dL — ABNORMAL HIGH (ref 70–99)
Glucose, Bld: 312 mg/dL — ABNORMAL HIGH (ref 70–99)
Potassium: 5.9 mmol/L — ABNORMAL HIGH (ref 3.5–5.1)
Potassium: 5 mmol/L (ref 3.5–5.1)
Sodium: 135 mmol/L (ref 135–145)
Sodium: 133 mmol/L — ABNORMAL LOW (ref 135–145)

## 2023-06-16 LAB — GLUCOSE, RANDOM: Glucose, Bld: 224 mg/dL — ABNORMAL HIGH (ref 70–99)

## 2023-06-16 LAB — GLUCOSE, CAPILLARY: Glucose-Capillary: 238 mg/dL — ABNORMAL HIGH (ref 70–99)

## 2023-06-16 MED ORDER — HALOPERIDOL LACTATE 5 MG/ML IJ SOLN: 1.0000 mg | Freq: Four times a day (QID) | INTRAMUSCULAR | Status: DC | PRN

## 2023-06-16 MED ORDER — INSULIN ASPART 100 UNIT/ML IV SOLN
10.0000 [IU] | Freq: Once | INTRAVENOUS | Status: AC
  Administered 2023-06-16: 10 [IU] via INTRAVENOUS
  Filled 2023-06-16: qty 0.1

## 2023-06-16 MED ORDER — DEXTROSE 50 % IV SOLN
1.0000 | Freq: Once | INTRAVENOUS | Status: AC
  Administered 2023-06-16: 50 mL via INTRAVENOUS
  Filled 2023-06-16: qty 50

## 2023-06-16 MED ORDER — ALBUTEROL SULFATE (2.5 MG/3ML) 0.083% IN NEBU
10.0000 mg | INHALATION_SOLUTION | Freq: Once | RESPIRATORY_TRACT | Status: AC
  Administered 2023-06-16: 10 mg via RESPIRATORY_TRACT
  Filled 2023-06-16: qty 12

## 2023-06-16 MED ORDER — SODIUM ZIRCONIUM CYCLOSILICATE 10 G PO PACK
10.0000 g | PACK | Freq: Every day | ORAL | Status: DC
  Administered 2023-06-16: 10 g via ORAL
  Filled 2023-06-16: qty 1

## 2023-06-16 MED ORDER — FUROSEMIDE 10 MG/ML IJ SOLN
40.0000 mg | Freq: Once | INTRAMUSCULAR | Status: AC
  Administered 2023-06-16: 40 mg via INTRAVENOUS
  Filled 2023-06-16: qty 4

## 2023-06-16 NOTE — Discharge Summary (Signed)
Physician Discharge Summary   Patient: [name]. MRN: [id number] DOB: [birth year]  Admit date:     06/14/2023  Discharge date: 06/16/23  Discharge Physician: Delfino Lovett   PCP: [name], MD   Recommendations at discharge:   Follow-up with outpatient providers as requested  Discharge Diagnoses: Principal Problem:   Chest pain Active Problems:   Hyperkalemia   Malignant neoplasm of lower third of esophagus (HCC)   Essential hypertension   COPD (chronic obstructive pulmonary disease) (HCC)   PAD (peripheral artery disease) (HCC)   Chronic diastolic CHF (congestive heart failure) (HCC)   Type 2 diabetes mellitus with obesity (HCC)   Perinephric hematoma   Diarrhea   Persistent atrial fibrillation [facility])  Hospital Course: Assessment and Plan:  68 y.o. male with medical history significant of atrial fibrillation, asthma, congestive heart failure, COPD, coronary artery disease, depression, diabetes mellitus, gout, hyperlipidemia, hypertension, hypogonadism in male, morbid obesity, peripheral vascular disease, OSA on CPAP at night, esophageal cancer status post chemo and G-tube placement on palliative radiation presenting w/ chest pain    2/15: Treatment of hyperkalemia with Lasix and Lokelma.  Palliative care and psychiatry consult     Assessment & Plan:   Principal Problem:   Chest pain Active Problems:   Hyperkalemia   Malignant neoplasm of lower third of esophagus (HCC)   Essential hypertension   COPD (chronic obstructive pulmonary disease) (HCC)   PAD (peripheral artery disease) (HCC)   Chronic diastolic CHF (congestive heart failure) (HCC)   Type 2 diabetes mellitus with obesity (HCC)   Perinephric hematoma   Diarrhea   Persistent atrial fibrillation (HCC)   * Chest pain Pt w/ nonspecific R sided chest pain x 12 hours  Trop neg x 2  EKG sinus tach  CTA negative for PE  Noted prior hx/o STEMI S/p CABG x 2 2006, PCI with DES to RCA 2021 -  symptoms non similar to prior cardiac event  Suspect radiation induced chest wall inflammation as well as esophageal cancer as likely predominant etiologies for pain as patient reports regular radiation treatment to affected area.    Hyperkalemia K 5.8 > 4.6 Recurrent issue EKG stable  S/p lokelma and Lasix.  Avoid orange juice, chips and any potassium rich foods   Malignant neoplasm of lower third of esophagus (HCC) Baseline Distal esophageal cancer with extension into the proximal stomach and metastatic high left paratracheal and gastrohepatic ligament lymph nodes.  On palliative radiation  Followed by Dr. Donneta Romberg  Suspect radiation and esophageal cancer is confounding issue to chest pain    COPD (chronic obstructive pulmonary disease) (HCC) Stable from a resp standpoint  Cont home inhalers    Essential hypertension Controlled    PAD (peripheral artery disease) (HCC) S/p bilateral BKA (left 2023, right 2024)   Chronic diastolic CHF (congestive heart failure) (HCC) 2D ECHO 03/2023 w/ EF 55% and grade 1 DD    Persistent atrial fibrillation (HCC) Cont home medications including Cardizem, metoprolol and eliquis    Diarrhea Recent admission 03/2023 w/ noted c diff assd diarrhea- resolved  On lomotil    Perinephric hematoma Noted chronic complex hematoma dating back over a year from 03/2023 imaging-evaluated by Dr. Apolinar Junes w/ urology, nothing that has been drainable thus far, possible underlying renal mass. No active abd pain at present    Type 2 diabetes mellitus with obesity (HCC) Acute metabolic encephalopathy/agitation Overall difficult home situation.  He needs lot of care and family is struggling.  Ideally he needs  long-term care/24/7 care at home.  Requires Hoyer lift for any movement at home Evaluated by psychiatry.  He does have decision-making capacity per them.   He remains at very high risk for readmission -see his 30-day unplanned readmission risk score as  below  30 Day Unplanned Readmission Risk Score    Flowsheet Row ED to Hosp-Admission (Discharged) from 06/14/2023 in [facility] PCU  30 Day Unplanned Readmission Risk Score (%) 49.44 Filed at 06/16/2023 1200       This score is the patient's risk of an unplanned readmission within 30 days of being discharged (0 -100%). The score is based on dignosis, age, lab data, medications, orders, and past utilization.   Low:  0-14.9   Medium: 15-21.9   High: 22-29.9   Extreme: 30 and above               Consultants: Psychiatry Disposition: Home health Diet recommendation:  Discharge Diet Orders (From admission, onward)     Start     Ordered   06/16/23 0000  Diet - low sodium heart healthy        06/16/23 1244           Carb modified diet DISCHARGE MEDICATION: Allergies as of 06/16/2023       Reactions   Penicillins Other (See Comments)   Happened at 68 years old and pt. stated he passed out  He has tolerated amoxicillin/clavulanate and ampicillin/sulbactam   Statins    Other reaction(s): Muscle Pain Causes legs to ache per pt   Metformin And Related Diarrhea        Medication List     STOP taking these medications    feeding supplement (GLUCERNA 1.5 CAL) Liqd       TAKE these medications    acetaminophen 325 MG tablet Commonly known as: TYLENOL Place 2 tablets (650 mg total) into feeding tube every 6 (six) hours as needed for mild pain (pain score 1-3) (or Fever >/= 101).   amiodarone 200 MG tablet Commonly known as: PACERONE Take twice a day through 1/7 and then once daily What changed:  how much to take how to take this when to take this   apixaban 5 MG Tabs tablet Commonly known as: Eliquis Place 1 tablet (5 mg total) into feeding tube 2 (two) times daily.   ascorbic acid 500 MG tablet Commonly known as: VITAMIN C Place 1 tablet (500 mg total) into feeding tube daily.   Cholecalciferol 25 MCG (1000 UT) tablet Place 1 tablet  (1,000 Units total) into feeding tube daily.   clonazePAM 0.5 MG tablet Commonly known as: KLONOPIN Place 1 tablet (0.5 mg total) into feeding tube 2 (two) times daily.   Dermacloud Oint Apply 1 Application topically daily as needed.   diltiazem 300 MG 24 hr capsule Commonly known as: CARDIZEM CD Take 300 mg by mouth daily.   diphenoxylate-atropine 2.5-0.025 MG tablet Commonly known as: LOMOTIL Place 1 tablet into feeding tube 4 (four) times daily.   ezetimibe 10 MG tablet Commonly known as: ZETIA Place 1 tablet (10 mg total) into feeding tube at bedtime.   ferrous sulfate 300 (60 Fe) MG/5ML syrup Place 5 mLs (300 mg total) into feeding tube daily. What changed: how much to take   fiber supplement (BANATROL TF) liquid Place 60 mLs into feeding tube 2 (two) times daily.   finasteride 5 MG tablet Commonly known as: PROSCAR Take 1 tablet by mouth daily.   free water Soln Place  30 mLs into feeding tube 6 (six) times daily.   furosemide 40 MG tablet Commonly known as: LASIX Take 40 mg by mouth daily.   gabapentin 300 MG capsule Commonly known as: NEURONTIN Take 1 capsule by mouth at bedtime.   HumaLOG 100 UNIT/ML injection Generic drug: insulin lispro Inject into the skin. Sliding scale   isosorbide mononitrate 60 MG 24 hr tablet Commonly known as: IMDUR Take 60 mg by mouth in the morning and at bedtime.   Lantus 100 UNIT/ML injection Generic drug: insulin glargine Inject 40 Units into the skin 2 (two) times daily.   levothyroxine 25 MCG tablet Commonly known as: SYNTHROID Place 1 tablet (25 mcg total) into feeding tube daily at 6 (six) AM.   lidocaine-prilocaine cream Commonly known as: EMLA Apply 1 Application topically as needed.   loperamide HCl 1 MG/7.5ML suspension Commonly known as: IMODIUM Place 15 mLs (2 mg total) into feeding tube every 6 (six) hours as needed for diarrhea or loose stools.   losartan 50 MG tablet Commonly known as: COZAAR Take  50 mg by mouth daily.   metoprolol tartrate 25 MG tablet Commonly known as: LOPRESSOR Take 0.5 tablets (12.5 mg total) by mouth 2 (two) times daily.   multivitamin with minerals tablet Take 1 tablet by mouth daily.   nitrofurantoin (macrocrystal-monohydrate) 100 MG capsule Commonly known as: MACROBID Take 100 mg by mouth 2 (two) times daily.   nitroGLYCERIN 0.4 MG SL tablet Commonly known as: NITROSTAT Place 1 tablet (0.4 mg total) under the tongue every 5 (five) minutes as needed for chest pain.   nystatin powder Commonly known as: MYCOSTATIN/NYSTOP Apply 1 Application topically 2 (two) times daily. Under belly   ondansetron 4 MG tablet Commonly known as: ZOFRAN Place 1 tablet (4 mg total) into feeding tube every 8 (eight) hours as needed for nausea.   pramipexole 1 MG tablet Commonly known as: MIRAPEX Place 2 tablets (2 mg total) into feeding tube at bedtime.   primidone 250 MG tablet Commonly known as: MYSOLINE Place 1 tablet (250 mg total) into feeding tube 2 (two) times daily.   tamsulosin 0.4 MG Caps capsule Commonly known as: FLOMAX Take 0.4 mg by mouth.   vitamin A & D ointment Apply 1 Application topically as needed for dry skin. Apply to stumps   Vitamin B-12 5000 MCG Tbdp Place 10,000 mcg into feeding tube daily.   zinc sulfate (50mg  elemental zinc) 220 (50 Zn) MG capsule Take 220 mg by mouth daily.        Follow-up Information     [name], MD. Schedule an appointment as soon as possible for a visit in 3 day(s).   Specialty: Internal Medicine Why: [facility] Discharge F/UP Contact information: [address] [facility] [place] [address] [phone]         [name], MD. Schedule an appointment as soon as possible for a visit in 2 week(s).   Specialties: Internal Medicine, Oncology Why: [facility] Discharge F/UP Contact information: [address] [place]  [address] [phone]                Discharge Exam: Ceasar Mons Weights   06/13/23 2233  Weight: 108.9 kg   General exam: Appears calm and comfortable, morbidly obese Respiratory system: Clear to auscultation. Respiratory effort normal. Cardiovascular system: S1 & S2 heard, RRR. No JVD, murmurs, rubs, gallops or clicks. No pedal edema. Gastrointestinal system: Abdomen is soft, benign Central nervous system: Alert  and oriented. No focal neurological deficits. Extremities: Bilateral AKA Skin: No rashes, lesions or ulcers Psychiatry: Judgement and insight appear normal. Mood & affect appropriate.   Condition at discharge: fair  The results of significant diagnostics from this hospitalization (including imaging, microbiology, ancillary and laboratory) are listed below for reference.   Imaging Studies: CT ABDOMEN PELVIS W CONTRAST Result Date: 06/14/2023 CLINICAL DATA:  Abdominal pain, acute, nonlocalized. Esophageal neoplasm history EXAM: CT ABDOMEN AND PELVIS WITH CONTRAST TECHNIQUE: Multidetector CT imaging of the abdomen and pelvis was performed using the standard protocol following bolus administration of intravenous contrast. RADIATION DOSE REDUCTION: This exam was performed according to the departmental dose-optimization program which includes automated exposure control, adjustment of the mA and/or kV according to patient size and/or use of iterative reconstruction technique. CONTRAST:  OMNIPAQUE IOHEXOL 300 MG/ML  SOLN COMPARISON:  CT abdomen pelvis 03/28/2023, CT abdomen pelvis 08/13/2022 FINDINGS: Lower chest: Bilateral lower lobe atelectasis. Distal esophageal and gastroesophageal wall thickening. No acute abnormality. Hepatobiliary: No focal liver abnormality. No gallstones, gallbladder wall thickening, or pericholecystic fluid. No biliary dilatation. Pancreas: No focal lesion. Normal pancreatic contour. No surrounding inflammatory changes. No main pancreatic ductal dilatation.  Spleen: Normal in size without focal abnormality. Adrenals/Urinary Tract: No adrenal nodule bilaterally. Bilateral kidneys enhance symmetrically. Right UE with the left thickening. A densely lesions likely represent simple renal cysts. Simple renal cysts, in the absence of clinically indicated signs/symptoms, require no independent follow-up. Question developing heterogeneous enhancing renal mass measuring 5 cm (2:29). No hydronephrosis. No hydroureter. The urinary bladder is unremarkable. Stomach/Bowel: Gastrostomy tube in appropriate position with inflated balloon within the gastric lumen. Stomach is within normal limits. No evidence of bowel wall thickening or dilatation. Colonic diverticulosis. The appendix is not definitely identified with no inflammatory changes in the right lower quadrant to suggest acute appendicitis. Vascular/Lymphatic: No abdominal aorta or iliac aneurysm. Severe atherosclerotic plaque of the aorta and its branches. Prominent but nonenlarged retroperitoneal lymph nodes. No abdominal, pelvic, or inguinal lymphadenopathy. Reproductive: Prostate is unremarkable. Other: Trace perihepatic high density free fluid. No intraperitoneal free gas. Grossly stable in size 13 x 4 x 9cm (from 11.5 x 6 cm) perinephric abscess formation that is inseparable from the right psoas muscle and renal parenchyma. Abscess likely centered within the psoas muscle and abutting the kidney. Musculoskeletal: No abdominal wall hernia or abnormality. No suspicious lytic or blastic osseous lesions. No acute displaced fracture. Multilevel degenerative changes of the spine. L3-L4 and L4-L5 posterior disc osteophyte complex formation. IMPRESSION: 1. Grossly stable in size 13 x 4 cm (from 11.5 x 6 cm) perinephric abscess formation that is inseparable from the right psoas muscle and renal parenchyma. Abscess likely centered within the psoas muscle and abutting the kidney. 2. Trace perihepatic high density free fluid. 3. Right  urothelial thickening suggestive of infection. Correlate with urinalysis. 4. Question developing heterogeneous enhancing renal mass measuring 5 cm. When the patient is clinically stable and able to follow directions and hold their breath (preferably as an outpatient) further evaluation with dedicated MRI renal protocol should be considered. 5. Distal esophageal and gastroesophageal wall thickening. Findings consistent with known esophageal malignancy. 6. Colonic diverticulosis with no acute diverticulitis. 7.  Aortic Atherosclerosis (ICD10-I70.0). Electronically Signed   By: Tish Frederickson M.D.   On: 06/14/2023 17:16   CT Angio Chest PE W/Cm &/Or Wo Cm Result Date: 06/14/2023 CLINICAL DATA:  High probability pulmonary embolism, original carcinoma, chest pain. * Tracking Code: BO * EXAM: CT ANGIOGRAPHY CHEST WITH CONTRAST TECHNIQUE:  Multidetector CT imaging of the chest was performed using the standard protocol during bolus administration of intravenous contrast. Multiplanar CT image reconstructions and MIPs were obtained to evaluate the vascular anatomy. RADIATION DOSE REDUCTION: This exam was performed according to the departmental dose-optimization program which includes automated exposure control, adjustment of the mA and/or kV according to patient size and/or use of iterative reconstruction technique. CONTRAST:  OMNIPAQUE IOHEXOL 350 MG/ML SOLN COMPARISON:  03/15/2023 FINDINGS: Cardiovascular: There is adequate opacification of the pulmonary arterial tree. No intraluminal filling defect identified to suggest acute pulmonary embolism through the segmental level. Coronary artery bypass grafting has been performed. Cardiac size is within normal limits. No pericardial effusion. Mild atherosclerotic calcification within thoracic aorta. No aortic aneurysm. Right internal jugular chest port tip is seen within the superior vena cava. Mediastinum/Nodes: Marked circumferential thickening of the distal esophagus  with paraesophageal infiltration is again seen and appears stable since prior examination in keeping with the known underlying esophageal malignancy. No pathologic mediastinal adenopathy. Visualized thyroid is unremarkable. Lungs/Pleura: Stable 5 mm noncalcified pulmonary nodule within the right upper lobe, axial image # 31/15, indeterminate. Mild emphysema. Bronchial wall thickening present in keeping with diffuse airway inflammation. No confluent pulmonary infiltrate. No pneumothorax or pleural effusion. Minimal bibasilar atelectasis. Upper Abdomen: Liver retention gastrostomy catheter in expected position within the distal stomach. Shotty gastrohepatic and right retrocrural adenopathy is stable. No acute abnormality. Musculoskeletal: No acute bone abnormality. No lytic or blastic bone lesion. Review of the MIP images confirms the above findings. IMPRESSION: 1. No pulmonary embolism. No acute intrathoracic pathology identified. 2. Stable marked circumferential thickening of the distal esophagus with paraesophageal infiltration in keeping with the known underlying esophageal malignancy. 3. Stable 5 mm noncalcified pulmonary nodule within the right upper lobe, indeterminate. Close attention on subsequent surveillance imaging is warranted. 4. Stable shotty gastrohepatic and right retrocrural adenopathy. Aortic Atherosclerosis (ICD10-I70.0) and Emphysema (ICD10-J43.9). Electronically Signed   By: Helyn Numbers M.D.   On: 06/14/2023 01:59   DG Chest Port 1 View Result Date: 06/13/2023 CLINICAL DATA:  Chest pain, head and neck cancer EXAM: PORTABLE CHEST 1 VIEW COMPARISON:  05/22/2023 FINDINGS: 2 frontal views of the chest demonstrate a stable right chest wall port. Postsurgical changes from median sternotomy. The cardiac silhouette is unremarkable. Stable ectasia of the thoracic aorta. No airspace disease, effusion, or pneumothorax. No acute bony abnormalities. IMPRESSION: 1. Stable chest, no acute process.  Electronically Signed   By: Sharlet Salina M.D.   On: 06/13/2023 23:09   DG Chest 2 View Result Date: 05/22/2023 CLINICAL DATA:  Chest pain. EXAM: CHEST - 2 VIEW COMPARISON:  Chest radiograph dated 03/28/2023. FINDINGS: Right-sided Port-A-Cath with tip over upper SVC. There are bibasilar atelectasis. No focal consolidation, pleural effusion, pneumothorax. Stable mild cardiomegaly. Median sternotomy wires. No acute osseous pathology. IMPRESSION: 1. No active cardiopulmonary disease. 2. Mild cardiomegaly. Electronically Signed   By: Elgie Collard M.D.   On: 05/22/2023 19:13    Microbiology: Results for orders placed or performed during the hospital encounter of 06/14/23  Culture, blood (routine x 2)     Status: None (Preliminary result)   Collection Time: 06/14/23  3:50 AM   Specimen: BLOOD  Result Value Ref Range Status   Specimen Description BLOOD RIGHT ARM  Final   Special Requests   Final    BOTTLES DRAWN AEROBIC AND ANAEROBIC Blood Culture results may not be optimal due to an inadequate volume of blood received in culture bottles   Culture  Final    NO GROWTH 2 DAYS Performed at [facility], [address] [place], [place] [address]    Report Status PENDING  Incomplete  Culture, blood (routine x 2)     Status: None (Preliminary result)   Collection Time: 06/14/23  3:58 AM   Specimen: BLOOD  Result Value Ref Range Status   Specimen Description BLOOD RIGHT ARM  Final   Special Requests   Final    BOTTLES DRAWN AEROBIC AND ANAEROBIC Blood Culture results may not be optimal due to an inadequate volume of blood received in culture bottles   Culture   Final    NO GROWTH 2 DAYS Performed at [facility], [address] [place], [place] [address]    Report Status PENDING  Incomplete    Labs: CBC: Recent Labs  Lab 06/12/23 0941 06/13/23 2303 06/16/23 0338  WBC 9.9 11.8* 7.1  HGB 10.5* 10.0* 9.4*  HCT 34.6* 31.1* 30.1*  MCV 97.5 92.6 95.0  PLT 267 243  227   Basic Metabolic Panel: Recent Labs  Lab 06/14/23 2120 06/15/23 0623 06/15/23 1229 06/15/23 1522 06/15/23 1847 06/15/23 2140 06/16/23 0338 06/16/23 1041 06/16/23 1421  NA 133* 135 134*  --   --   --  135 133*  --   K 6.1* 5.7* 5.6*   < > 5.3* 5.4* 5.9* 5.0 4.6  4.7  CL 100 105 104  --   --   --  105 102  --   CO2 24 24 23   --   --   --  21* 23  --   GLUCOSE 180* 124* 155*  --   --   --  169* 312* 224*  BUN 39* 36* 32*  --   --   --  37* 38*  --   CREATININE 0.91 1.03 1.08  --   --   --  1.05 1.13  --   CALCIUM 8.8* 8.9 9.0  --   --   --  8.7* 8.5*  --    < > = values in this interval not displayed.   Liver Function Tests: Recent Labs  Lab 06/14/23 0022  AST 22  ALT 36  ALKPHOS 121  BILITOT 0.5  PROT 7.1  ALBUMIN 2.7*   CBG: Recent Labs  Lab 06/16/23 1123  GLUCAP 238*    Discharge time spent: greater than 30 minutes.  Signed: Delfino Lovett, MD [facility] 06/16/2023

## 2023-06-16 NOTE — Progress Notes (Signed)
AVS printed and reviewed with patient. PIV removed. Paperwork placed in chart for EMS

## 2023-06-16 NOTE — Progress Notes (Signed)
The patient has been intermittently confused during the shift. He has removed his Telemetry box and leads at this time stating that he will be going to a scan shortly. He can't wear them there. Attempting to reorient him is not possible at this time. He states that he will be getting the scan done than catching the bus at the cancer center this morning.  The majority of the shift I have been successfully able to reorient him to follow protocol.  He is addiment about doing what he says at this time.

## 2023-06-16 NOTE — Plan of Care (Signed)
   Problem: Education: Goal: Understanding of cardiac disease, CV risk reduction, and recovery process will improve Outcome: Progressing Goal: Individualized Educational Video(s) Outcome: Progressing   Problem: Activity: Goal: Ability to tolerate increased activity will improve Outcome: Progressing   Problem: Cardiac: Goal: Ability to achieve and maintain adequate cardiovascular perfusion will improve Outcome: Progressing   Problem: Health Behavior/Discharge Planning: Goal: Ability to safely manage health-related needs after discharge will improve Outcome: Progressing   Problem: Education: Goal: Knowledge of General Education information will improve Description: Including pain rating scale, medication(s)/side effects and non-pharmacologic comfort measures Outcome: Progressing   Problem: Health Behavior/Discharge Planning: Goal: Ability to manage health-related needs will improve Outcome: Progressing   Problem: Clinical Measurements: Goal: Ability to maintain clinical measurements within normal limits will improve Outcome: Progressing Goal: Will remain free from infection Outcome: Progressing Goal: Diagnostic test results will improve Outcome: Progressing Goal: Respiratory complications will improve Outcome: Progressing Goal: Cardiovascular complication will be avoided Outcome: Progressing   Problem: Activity: Goal: Risk for activity intolerance will decrease Outcome: Progressing   Problem: Nutrition: Goal: Adequate nutrition will be maintained Outcome: Progressing   Problem: Coping: Goal: Level of anxiety will decrease Outcome: Progressing   Problem: Elimination: Goal: Will not experience complications related to bowel motility Outcome: Progressing Goal: Will not experience complications related to urinary retention Outcome: Progressing   Problem: Pain Managment: Goal: General experience of comfort will improve and/or be controlled Outcome: Progressing    Problem: Safety: Goal: Ability to remain free from injury will improve Outcome: Progressing   Problem: Skin Integrity: Goal: Risk for impaired skin integrity will decrease Outcome: Progressing

## 2023-06-16 NOTE — TOC Transition Note (Signed)
Transition of Care [facility]) - Discharge Note   Patient Details  Name: [name]. MRN: [id number] Date of Birth: Dec 19, 1955  Transition of Care [facility]) CM/SW Contact:  Liliana Cline, LCSW Phone Number: 06/16/2023, 12:50 PM   Clinical Narrative:    Per MD, patient ready to DC to resume [facility] services and EMS. Per Herbert Seta with [facility], patient is active for RN, PT, and OT Called wife with update. She confirmed patient is active with [facility], she is asking for CSW to check if [facility] can take patient instead and wants to add Aide to [facility] orders. CSW called [facility] Rep Bjorn Loser. Bjorn Loser states they can accept patient. She is aware of DC home today. Notified Heather with [facility] to cancel per wife's request. Wife confirmed home address and that she is home to receive the patient via EMS transport. EMS paperwork completed. [facility] arranged.    Final next level of care: Home w Home Health Services Barriers to Discharge: Barriers Resolved   Patient Goals and CMS Choice            Discharge Placement                       Discharge Plan and Services Additional resources added to the After Visit Summary for                            [facility] Arranged: PT, OT, RN [facility] Agency: [facility]) Date [facility] Agency Contacted: 06/16/23   Representative spoke with at [facility] Agency: Herbert Seta  Social Drivers of Health (SDOH) Interventions SDOH Screenings   Food Insecurity: No Food Insecurity (06/14/2023)  Housing: Low Risk  (06/14/2023)  Transportation Needs: No Transportation Needs (06/14/2023)  Utilities: Not At Risk (06/14/2023)  Depression (PHQ2-9): Low Risk  (11/21/2021)  Financial Resource Strain: Low Risk  (09/28/2022)   Received from [facility]  Social Connections: Socially Isolated (06/14/2023)  Tobacco Use: Medium Risk (06/14/2023)     Readmission Risk Interventions    02/13/2022    4:57 PM 09/29/2021   11:12 AM  Readmission Risk  Prevention Plan  Transportation Screening Complete Complete  PCP or Specialist Appt within 3-5 Days  Complete  HRI or Home Care Consult  Complete  Social Work Consult for Recovery Care Planning/Counseling  Complete  Palliative Care Screening  Not Applicable  Medication Review [employer]) Complete Complete  PCP or Specialist appointment within 3-5 days of discharge Complete   HRI or Home Care Consult Complete   SW Recovery Care/Counseling Consult Complete   Palliative Care Screening Not Applicable   Skilled Nursing Facility Not Applicable

## 2023-06-17 ENCOUNTER — Inpatient Hospital Stay: Payer: Medicare Other

## 2023-06-17 ENCOUNTER — Encounter: Payer: Self-pay | Admitting: Internal Medicine

## 2023-06-17 ENCOUNTER — Ambulatory Visit
Admission: RE | Admit: 2023-06-17 | Discharge: 2023-06-17 | Disposition: A | Discharge status: Home / Self Care | Payer: Medicare Other | Source: Ambulatory Visit | Attending: Radiation Oncology | Admitting: Radiation Oncology

## 2023-06-17 ENCOUNTER — Other Ambulatory Visit: Payer: Self-pay

## 2023-06-17 DIAGNOSIS — C155 Malignant neoplasm of lower third of esophagus: Secondary | ICD-10-CM | POA: Diagnosis not present

## 2023-06-17 LAB — CBC (CANCER CENTER ONLY)
HCT: 29.8 % — ABNORMAL LOW (ref 39.0–52.0)
Hemoglobin: 9.2 g/dL — ABNORMAL LOW (ref 13.0–17.0)
MCH: 29.9 pg (ref 26.0–34.0)
MCHC: 30.9 g/dL (ref 30.0–36.0)
MCV: 96.8 fL (ref 80.0–100.0)
Platelet Count: 212 10*3/uL (ref 150–400)
RBC: 3.08 MIL/uL — ABNORMAL LOW (ref 4.22–5.81)
RDW: 17.3 % — ABNORMAL HIGH (ref 11.5–15.5)
WBC Count: 9.7 10*3/uL (ref 4.0–10.5)
nRBC: 0 % (ref 0.0–0.2)

## 2023-06-17 LAB — RAD ONC ARIA SESSION SUMMARY
Course Elapsed Days: 20
Plan Fractions Treated to Date: 13
Plan Prescribed Dose Per Fraction: 2 Gy
Plan Total Fractions Prescribed: 25
Plan Total Prescribed Dose: 50 Gy
Reference Point Dosage Given to Date: 26 Gy
Reference Point Session Dosage Given: 2 Gy
Session Number: 13

## 2023-06-18 ENCOUNTER — Ambulatory Visit
Admission: RE | Admit: 2023-06-18 | Discharge: 2023-06-18 | Disposition: A | Discharge status: Home / Self Care | Payer: Medicare Other | Source: Ambulatory Visit | Attending: Radiation Oncology | Admitting: Radiation Oncology

## 2023-06-18 ENCOUNTER — Other Ambulatory Visit: Payer: Self-pay

## 2023-06-18 DIAGNOSIS — C155 Malignant neoplasm of lower third of esophagus: Secondary | ICD-10-CM | POA: Diagnosis not present

## 2023-06-18 LAB — RAD ONC ARIA SESSION SUMMARY
Course Elapsed Days: 21
Plan Fractions Treated to Date: 14
Plan Prescribed Dose Per Fraction: 2 Gy
Plan Total Fractions Prescribed: 25
Plan Total Prescribed Dose: 50 Gy
Reference Point Dosage Given to Date: 28 Gy
Reference Point Session Dosage Given: 2 Gy
Session Number: 14

## 2023-06-19 ENCOUNTER — Ambulatory Visit
Admission: RE | Admit: 2023-06-19 | Discharge: 2023-06-19 | Disposition: A | Discharge status: Home / Self Care | Payer: Medicare Other | Source: Ambulatory Visit | Attending: Radiation Oncology | Admitting: Radiation Oncology

## 2023-06-19 ENCOUNTER — Other Ambulatory Visit: Payer: Self-pay

## 2023-06-19 DIAGNOSIS — C155 Malignant neoplasm of lower third of esophagus: Secondary | ICD-10-CM | POA: Diagnosis not present

## 2023-06-19 LAB — CULTURE, BLOOD (ROUTINE X 2)
Culture: NO GROWTH
Culture: NO GROWTH

## 2023-06-19 LAB — RAD ONC ARIA SESSION SUMMARY
Course Elapsed Days: 22
Plan Fractions Treated to Date: 15
Plan Prescribed Dose Per Fraction: 2 Gy
Plan Total Fractions Prescribed: 25
Plan Total Prescribed Dose: 50 Gy
Reference Point Dosage Given to Date: 30 Gy
Reference Point Session Dosage Given: 2 Gy
Session Number: 15

## 2023-06-20 ENCOUNTER — Ambulatory Visit: Payer: Medicare Other

## 2023-06-20 ENCOUNTER — Inpatient Hospital Stay: Payer: Medicare Other

## 2023-06-20 NOTE — Progress Notes (Signed)
Nutrition Follow-up:  Patient with esophageal cancer.  Patient receiving radiation only.  PEG placed on Nov 2024. Recent hospital admission with chest pain felt to be related to radiation, hyperkalemia (treated with lasix and lokelma).    Spoke with wife via phone.  Reports that patient has been taking 6 cartons of glucerna 1.5 each day.  Wife usually gives patient 2 cartons at first feeding before he comes for radiation then 1 carton for additional feedings.  Reports that urinary catheter is out and patient is urinating well.  Has eaten some egg drop soup and cream of chicken soup with success.  Wife reports some loose stool.     Medications: reviewed  Labs: K 4.6 (2/16)  Anthropometrics:   Weight 240 lb 1.3 oz on 2/13 (hospital admission)  262 lb on 1/22   Estimated Energy Needs  Kcals: 2600-2975 Protein: 130-148 g Fluid: 2600 ml  NUTRITION DIAGNOSIS: Inadequate oral intake relying on feeding tube   INTERVENTION:  Continue glucerna 1.5, 6 cartons per day. Flush with 60ml of water before and 60-120 ml after (5 feedings).  Flush with 6-7 oz water 1-2 times a day between feedings. Provides 2136 calories, 117.6 g protein and 2200-2400 ml free water (free water in formula and flush), 2880 mg K. Continue liquids orally as tolerated per MD     MONITORING, EVALUATION, GOAL: weight trends, tube feeding, intake   NEXT VISIT: Thursday, phone call March 20  Bernadette Armijo B. Freida Busman, RD, LDN Registered Dietitian [phone]

## 2023-06-21 ENCOUNTER — Other Ambulatory Visit: Payer: Self-pay

## 2023-06-21 ENCOUNTER — Ambulatory Visit
Admission: RE | Admit: 2023-06-21 | Discharge: 2023-06-21 | Disposition: A | Discharge status: Home / Self Care | Payer: Medicare Other | Source: Ambulatory Visit | Attending: Radiation Oncology | Admitting: Radiation Oncology

## 2023-06-21 DIAGNOSIS — C155 Malignant neoplasm of lower third of esophagus: Secondary | ICD-10-CM | POA: Diagnosis not present

## 2023-06-21 LAB — RAD ONC ARIA SESSION SUMMARY
Course Elapsed Days: 24
Plan Fractions Treated to Date: 16
Plan Prescribed Dose Per Fraction: 2 Gy
Plan Total Fractions Prescribed: 25
Plan Total Prescribed Dose: 50 Gy
Reference Point Dosage Given to Date: 32 Gy
Reference Point Session Dosage Given: 2 Gy
Session Number: 16

## 2023-06-24 ENCOUNTER — Inpatient Hospital Stay: Payer: Medicare Other

## 2023-06-24 ENCOUNTER — Other Ambulatory Visit: Payer: Self-pay

## 2023-06-24 ENCOUNTER — Ambulatory Visit
Admission: RE | Admit: 2023-06-24 | Discharge: 2023-06-24 | Disposition: A | Discharge status: Home / Self Care | Payer: Medicare Other | Source: Ambulatory Visit | Attending: Radiation Oncology | Admitting: Radiation Oncology

## 2023-06-24 DIAGNOSIS — C155 Malignant neoplasm of lower third of esophagus: Secondary | ICD-10-CM | POA: Diagnosis not present

## 2023-06-24 LAB — CBC (CANCER CENTER ONLY)
HCT: 36.9 % — ABNORMAL LOW (ref 39.0–52.0)
Hemoglobin: 10.9 g/dL — ABNORMAL LOW (ref 13.0–17.0)
MCH: 30.3 pg (ref 26.0–34.0)
MCHC: 29.5 g/dL — ABNORMAL LOW (ref 30.0–36.0)
MCV: 102.5 fL — ABNORMAL HIGH (ref 80.0–100.0)
Platelet Count: 181 10*3/uL (ref 150–400)
RBC: 3.6 MIL/uL — ABNORMAL LOW (ref 4.22–5.81)
RDW: 17.9 % — ABNORMAL HIGH (ref 11.5–15.5)
WBC Count: 8.9 10*3/uL (ref 4.0–10.5)
nRBC: 0 % (ref 0.0–0.2)

## 2023-06-24 LAB — RAD ONC ARIA SESSION SUMMARY
Course Elapsed Days: 27
Plan Fractions Treated to Date: 17
Plan Prescribed Dose Per Fraction: 2 Gy
Plan Total Fractions Prescribed: 25
Plan Total Prescribed Dose: 50 Gy
Reference Point Dosage Given to Date: 34 Gy
Reference Point Session Dosage Given: 2 Gy
Session Number: 17

## 2023-06-25 ENCOUNTER — Other Ambulatory Visit: Payer: Self-pay

## 2023-06-25 ENCOUNTER — Ambulatory Visit
Admission: RE | Admit: 2023-06-25 | Discharge: 2023-06-25 | Disposition: A | Discharge status: Home / Self Care | Payer: Medicare Other | Source: Ambulatory Visit | Attending: Radiation Oncology | Admitting: Radiation Oncology

## 2023-06-25 DIAGNOSIS — C155 Malignant neoplasm of lower third of esophagus: Secondary | ICD-10-CM | POA: Diagnosis not present

## 2023-06-25 LAB — RAD ONC ARIA SESSION SUMMARY
Course Elapsed Days: 28
Plan Fractions Treated to Date: 18
Plan Prescribed Dose Per Fraction: 2 Gy
Plan Total Fractions Prescribed: 25
Plan Total Prescribed Dose: 50 Gy
Reference Point Dosage Given to Date: 36 Gy
Reference Point Session Dosage Given: 2 Gy
Session Number: 18

## 2023-06-26 ENCOUNTER — Ambulatory Visit
Admission: RE | Admit: 2023-06-26 | Discharge: 2023-06-26 | Disposition: A | Discharge status: Home / Self Care | Payer: Medicare Other | Source: Ambulatory Visit | Attending: Radiation Oncology | Admitting: Radiation Oncology

## 2023-06-26 ENCOUNTER — Other Ambulatory Visit: Payer: Self-pay

## 2023-06-26 DIAGNOSIS — C155 Malignant neoplasm of lower third of esophagus: Secondary | ICD-10-CM | POA: Diagnosis not present

## 2023-06-26 LAB — RAD ONC ARIA SESSION SUMMARY
Course Elapsed Days: 29
Plan Fractions Treated to Date: 19
Plan Prescribed Dose Per Fraction: 2 Gy
Plan Total Fractions Prescribed: 25
Plan Total Prescribed Dose: 50 Gy
Reference Point Dosage Given to Date: 38 Gy
Reference Point Session Dosage Given: 2 Gy
Session Number: 19

## 2023-06-27 ENCOUNTER — Other Ambulatory Visit: Payer: Self-pay

## 2023-06-27 ENCOUNTER — Ambulatory Visit
Admission: RE | Admit: 2023-06-27 | Discharge: 2023-06-27 | Disposition: A | Discharge status: Home / Self Care | Payer: Medicare Other | Source: Ambulatory Visit | Attending: Radiation Oncology | Admitting: Radiation Oncology

## 2023-06-27 DIAGNOSIS — C155 Malignant neoplasm of lower third of esophagus: Secondary | ICD-10-CM | POA: Diagnosis not present

## 2023-06-27 LAB — RAD ONC ARIA SESSION SUMMARY
Course Elapsed Days: 30
Plan Fractions Treated to Date: 20
Plan Prescribed Dose Per Fraction: 2 Gy
Plan Total Fractions Prescribed: 25
Plan Total Prescribed Dose: 50 Gy
Reference Point Dosage Given to Date: 40 Gy
Reference Point Session Dosage Given: 2 Gy
Session Number: 20

## 2023-06-28 ENCOUNTER — Ambulatory Visit: Payer: Medicare Other

## 2023-06-29 ENCOUNTER — Emergency Department

## 2023-06-29 ENCOUNTER — Encounter: Payer: Self-pay | Admitting: Pulmonary Disease

## 2023-06-29 ENCOUNTER — Inpatient Hospital Stay

## 2023-06-29 ENCOUNTER — Other Ambulatory Visit: Payer: Self-pay

## 2023-06-29 ENCOUNTER — Inpatient Hospital Stay
Admission: EM | Admit: 2023-06-29 | Discharge: 2023-07-08 | DRG: 871 | Disposition: A | Discharge status: Home Health | Attending: Internal Medicine | Admitting: Internal Medicine

## 2023-06-29 DIAGNOSIS — W050XXA Fall from non-moving wheelchair, initial encounter: Secondary | ICD-10-CM | POA: Diagnosis present

## 2023-06-29 DIAGNOSIS — A419 Sepsis, unspecified organism: Secondary | ICD-10-CM | POA: Diagnosis not present

## 2023-06-29 DIAGNOSIS — Z89512 Acquired absence of left leg below knee: Secondary | ICD-10-CM | POA: Diagnosis not present

## 2023-06-29 DIAGNOSIS — E785 Hyperlipidemia, unspecified: Secondary | ICD-10-CM | POA: Diagnosis present

## 2023-06-29 DIAGNOSIS — J440 Chronic obstructive pulmonary disease with acute lower respiratory infection: Secondary | ICD-10-CM | POA: Diagnosis present

## 2023-06-29 DIAGNOSIS — Z951 Presence of aortocoronary bypass graft: Secondary | ICD-10-CM

## 2023-06-29 DIAGNOSIS — I5033 Acute on chronic diastolic (congestive) heart failure: Secondary | ICD-10-CM | POA: Diagnosis present

## 2023-06-29 DIAGNOSIS — I480 Paroxysmal atrial fibrillation: Secondary | ICD-10-CM | POA: Diagnosis present

## 2023-06-29 DIAGNOSIS — I959 Hypotension, unspecified: Secondary | ICD-10-CM | POA: Diagnosis present

## 2023-06-29 DIAGNOSIS — J159 Unspecified bacterial pneumonia: Secondary | ICD-10-CM | POA: Diagnosis present

## 2023-06-29 DIAGNOSIS — I110 Hypertensive heart disease with heart failure: Secondary | ICD-10-CM | POA: Diagnosis present

## 2023-06-29 DIAGNOSIS — Z604 Social exclusion and rejection: Secondary | ICD-10-CM | POA: Diagnosis present

## 2023-06-29 DIAGNOSIS — N179 Acute kidney failure, unspecified: Secondary | ICD-10-CM | POA: Diagnosis present

## 2023-06-29 DIAGNOSIS — E039 Hypothyroidism, unspecified: Secondary | ICD-10-CM | POA: Diagnosis present

## 2023-06-29 DIAGNOSIS — Y92009 Unspecified place in unspecified non-institutional (private) residence as the place of occurrence of the external cause: Secondary | ICD-10-CM | POA: Diagnosis not present

## 2023-06-29 DIAGNOSIS — Z794 Long term (current) use of insulin: Secondary | ICD-10-CM

## 2023-06-29 DIAGNOSIS — E875 Hyperkalemia: Secondary | ICD-10-CM | POA: Diagnosis present

## 2023-06-29 DIAGNOSIS — Z9221 Personal history of antineoplastic chemotherapy: Secondary | ICD-10-CM

## 2023-06-29 DIAGNOSIS — E1151 Type 2 diabetes mellitus with diabetic peripheral angiopathy without gangrene: Secondary | ICD-10-CM | POA: Diagnosis present

## 2023-06-29 DIAGNOSIS — N170 Acute kidney failure with tubular necrosis: Secondary | ICD-10-CM | POA: Diagnosis present

## 2023-06-29 DIAGNOSIS — Z89612 Acquired absence of left leg above knee: Secondary | ICD-10-CM

## 2023-06-29 DIAGNOSIS — Z79890 Hormone replacement therapy (postmenopausal): Secondary | ICD-10-CM

## 2023-06-29 DIAGNOSIS — R918 Other nonspecific abnormal finding of lung field: Secondary | ICD-10-CM | POA: Diagnosis present

## 2023-06-29 DIAGNOSIS — G9340 Encephalopathy, unspecified: Secondary | ICD-10-CM | POA: Diagnosis present

## 2023-06-29 DIAGNOSIS — J690 Pneumonitis due to inhalation of food and vomit: Secondary | ICD-10-CM | POA: Diagnosis present

## 2023-06-29 DIAGNOSIS — C159 Malignant neoplasm of esophagus, unspecified: Secondary | ICD-10-CM | POA: Diagnosis present

## 2023-06-29 DIAGNOSIS — Z89611 Acquired absence of right leg above knee: Secondary | ICD-10-CM | POA: Diagnosis not present

## 2023-06-29 DIAGNOSIS — J189 Pneumonia, unspecified organism: Secondary | ICD-10-CM

## 2023-06-29 DIAGNOSIS — Z7901 Long term (current) use of anticoagulants: Secondary | ICD-10-CM

## 2023-06-29 DIAGNOSIS — I4891 Unspecified atrial fibrillation: Secondary | ICD-10-CM | POA: Diagnosis not present

## 2023-06-29 DIAGNOSIS — E860 Dehydration: Secondary | ICD-10-CM | POA: Diagnosis present

## 2023-06-29 DIAGNOSIS — J9601 Acute respiratory failure with hypoxia: Secondary | ICD-10-CM | POA: Diagnosis present

## 2023-06-29 DIAGNOSIS — Z888 Allergy status to other drugs, medicaments and biological substances status: Secondary | ICD-10-CM

## 2023-06-29 DIAGNOSIS — I4892 Unspecified atrial flutter: Secondary | ICD-10-CM | POA: Diagnosis present

## 2023-06-29 DIAGNOSIS — Z89511 Acquired absence of right leg below knee: Secondary | ICD-10-CM

## 2023-06-29 DIAGNOSIS — R1310 Dysphagia, unspecified: Secondary | ICD-10-CM | POA: Diagnosis present

## 2023-06-29 DIAGNOSIS — I10 Essential (primary) hypertension: Secondary | ICD-10-CM | POA: Diagnosis not present

## 2023-06-29 DIAGNOSIS — B9562 Methicillin resistant Staphylococcus aureus infection as the cause of diseases classified elsewhere: Secondary | ICD-10-CM | POA: Diagnosis present

## 2023-06-29 DIAGNOSIS — Z880 Allergy status to penicillin: Secondary | ICD-10-CM

## 2023-06-29 DIAGNOSIS — I2510 Atherosclerotic heart disease of native coronary artery without angina pectoris: Secondary | ICD-10-CM | POA: Diagnosis present

## 2023-06-29 DIAGNOSIS — I452 Bifascicular block: Secondary | ICD-10-CM | POA: Diagnosis present

## 2023-06-29 DIAGNOSIS — Z79899 Other long term (current) drug therapy: Secondary | ICD-10-CM

## 2023-06-29 DIAGNOSIS — G4733 Obstructive sleep apnea (adult) (pediatric): Secondary | ICD-10-CM | POA: Diagnosis present

## 2023-06-29 DIAGNOSIS — Y839 Surgical procedure, unspecified as the cause of abnormal reaction of the patient, or of later complication, without mention of misadventure at the time of the procedure: Secondary | ICD-10-CM | POA: Diagnosis present

## 2023-06-29 DIAGNOSIS — K9423 Gastrostomy malfunction: Secondary | ICD-10-CM | POA: Diagnosis present

## 2023-06-29 DIAGNOSIS — Z87440 Personal history of urinary (tract) infections: Secondary | ICD-10-CM

## 2023-06-29 DIAGNOSIS — B9789 Other viral agents as the cause of diseases classified elsewhere: Secondary | ICD-10-CM | POA: Diagnosis present

## 2023-06-29 DIAGNOSIS — Z6841 Body Mass Index (BMI) 40.0 and over, adult: Secondary | ICD-10-CM

## 2023-06-29 DIAGNOSIS — I252 Old myocardial infarction: Secondary | ICD-10-CM

## 2023-06-29 DIAGNOSIS — Z8501 Personal history of malignant neoplasm of esophagus: Secondary | ICD-10-CM

## 2023-06-29 DIAGNOSIS — Z931 Gastrostomy status: Secondary | ICD-10-CM | POA: Diagnosis not present

## 2023-06-29 DIAGNOSIS — B348 Other viral infections of unspecified site: Secondary | ICD-10-CM

## 2023-06-29 DIAGNOSIS — Z811 Family history of alcohol abuse and dependence: Secondary | ICD-10-CM

## 2023-06-29 DIAGNOSIS — Z8249 Family history of ischemic heart disease and other diseases of the circulatory system: Secondary | ICD-10-CM

## 2023-06-29 DIAGNOSIS — I498 Other specified cardiac arrhythmias: Secondary | ICD-10-CM | POA: Diagnosis not present

## 2023-06-29 DIAGNOSIS — Z923 Personal history of irradiation: Secondary | ICD-10-CM

## 2023-06-29 DIAGNOSIS — Z955 Presence of coronary angioplasty implant and graft: Secondary | ICD-10-CM

## 2023-06-29 DIAGNOSIS — F17290 Nicotine dependence, other tobacco product, uncomplicated: Secondary | ICD-10-CM | POA: Diagnosis present

## 2023-06-29 LAB — CBC
HCT: 31.2 % — ABNORMAL LOW (ref 39.0–52.0)
Hemoglobin: 9.7 g/dL — ABNORMAL LOW (ref 13.0–17.0)
MCH: 30.6 pg (ref 26.0–34.0)
MCHC: 31.1 g/dL (ref 30.0–36.0)
MCV: 98.4 fL (ref 80.0–100.0)
Platelets: 203 10*3/uL (ref 150–400)
RBC: 3.17 MIL/uL — ABNORMAL LOW (ref 4.22–5.81)
RDW: 18 % — ABNORMAL HIGH (ref 11.5–15.5)
WBC: 7.4 10*3/uL (ref 4.0–10.5)
nRBC: 0 % (ref 0.0–0.2)

## 2023-06-29 LAB — COMPREHENSIVE METABOLIC PANEL
ALT: 17 U/L (ref 0–44)
AST: 15 U/L (ref 15–41)
Albumin: 2.8 g/dL — ABNORMAL LOW (ref 3.5–5.0)
Alkaline Phosphatase: 122 U/L (ref 38–126)
Anion gap: 7 (ref 5–15)
BUN: 83 mg/dL — ABNORMAL HIGH (ref 8–23)
CO2: 30 mmol/L (ref 22–32)
Calcium: 9.3 mg/dL (ref 8.9–10.3)
Chloride: 97 mmol/L — ABNORMAL LOW (ref 98–111)
Creatinine, Ser: 1.39 mg/dL — ABNORMAL HIGH (ref 0.61–1.24)
GFR, Estimated: 55 mL/min — ABNORMAL LOW (ref >60)
Glucose, Bld: 122 mg/dL — ABNORMAL HIGH (ref 70–99)
Potassium: 7.4 mmol/L (ref 3.5–5.1)
Sodium: 134 mmol/L — ABNORMAL LOW (ref 135–145)
Total Bilirubin: 0.4 mg/dL (ref 0.0–1.2)
Total Protein: 6.8 g/dL (ref 6.5–8.1)

## 2023-06-29 LAB — BASIC METABOLIC PANEL
Anion gap: 6 (ref 5–15)
BUN: 77 mg/dL — ABNORMAL HIGH (ref 8–23)
CO2: 30 mmol/L (ref 22–32)
Calcium: 8.7 mg/dL — ABNORMAL LOW (ref 8.9–10.3)
Chloride: 99 mmol/L (ref 98–111)
Creatinine, Ser: 1.25 mg/dL — ABNORMAL HIGH (ref 0.61–1.24)
GFR, Estimated: >60 mL/min (ref >60)
Glucose, Bld: 115 mg/dL — ABNORMAL HIGH (ref 70–99)
Potassium: 6.4 mmol/L (ref 3.5–5.1)
Sodium: 135 mmol/L (ref 135–145)

## 2023-06-29 LAB — LACTIC ACID, PLASMA
Lactic Acid, Venous: 1 mmol/L (ref 0.5–1.9)
Lactic Acid, Venous: 1.2 mmol/L (ref 0.5–1.9)
Lactic Acid, Venous: 1.1 mmol/L (ref 0.5–1.9)
Lactic Acid, Venous: 0.9 mmol/L (ref 0.5–1.9)

## 2023-06-29 LAB — BRAIN NATRIURETIC PEPTIDE: B Natriuretic Peptide: 556.9 pg/mL — ABNORMAL HIGH (ref 0.0–100.0)

## 2023-06-29 LAB — TROPONIN I (HIGH SENSITIVITY): Troponin I (High Sensitivity): 11 ng/L (ref <18)

## 2023-06-29 LAB — URINALYSIS, ROUTINE W REFLEX MICROSCOPIC
Bacteria, UA: NONE SEEN
Bilirubin Urine: NEGATIVE
Glucose, UA: NEGATIVE mg/dL
Hgb urine dipstick: NEGATIVE
Ketones, ur: NEGATIVE mg/dL
Nitrite: NEGATIVE
Protein, ur: NEGATIVE mg/dL
Specific Gravity, Urine: 1.014 (ref 1.005–1.030)
pH: 5 (ref 5.0–8.0)

## 2023-06-29 LAB — LIPASE, BLOOD: Lipase: 22 U/L (ref 11–51)

## 2023-06-29 LAB — C-REACTIVE PROTEIN: CRP: 20.6 mg/dL — ABNORMAL HIGH (ref <1.0)

## 2023-06-29 LAB — GLUCOSE, CAPILLARY
Glucose-Capillary: 145 mg/dL — ABNORMAL HIGH (ref 70–99)
Glucose-Capillary: 96 mg/dL (ref 70–99)

## 2023-06-29 LAB — MRSA NEXT GEN BY PCR, NASAL: MRSA by PCR Next Gen: DETECTED — AB

## 2023-06-29 LAB — RESP PANEL BY RT-PCR (RSV, FLU A&B, COVID)  RVPGX2
Influenza A by PCR: NEGATIVE
Influenza B by PCR: NEGATIVE
Resp Syncytial Virus by PCR: NEGATIVE
SARS Coronavirus 2 by RT PCR: NEGATIVE

## 2023-06-29 LAB — PROTIME-INR
INR: 1.3 — ABNORMAL HIGH (ref 0.8–1.2)
Prothrombin Time: 16.2 s — ABNORMAL HIGH (ref 11.4–15.2)

## 2023-06-29 LAB — APTT: aPTT: 40 s — ABNORMAL HIGH (ref 24–36)

## 2023-06-29 MED ORDER — INSULIN ASPART 100 UNIT/ML IV SOLN
10.0000 [IU] | Freq: Once | INTRAVENOUS | Status: AC
  Administered 2023-06-29: 10 [IU] via INTRAVENOUS
  Filled 2023-06-29: qty 0.1

## 2023-06-29 MED ORDER — DEXTROSE 50 % IV SOLN
1.0000 | Freq: Once | INTRAVENOUS | Status: AC
  Administered 2023-06-29: 50 mL via INTRAVENOUS
  Filled 2023-06-29: qty 50

## 2023-06-29 MED ORDER — FENTANYL 2500MCG IN NS 250ML (10MCG/ML) PREMIX INFUSION
INTRAVENOUS | Status: AC
Start: 2023-06-29 — End: 2023-06-30
  Filled 2023-06-29: qty 250

## 2023-06-29 MED ORDER — POLYETHYLENE GLYCOL 3350 17 G PO PACK: 17.0000 g | PACK | Freq: Every day | ORAL | Status: DC | PRN

## 2023-06-29 MED ORDER — MUPIROCIN 2 % EX OINT
1.0000 | TOPICAL_OINTMENT | Freq: Two times a day (BID) | CUTANEOUS | Status: AC
  Administered 2023-06-29 – 2023-07-04 (×10): 1 via NASAL
  Filled 2023-06-29: qty 22

## 2023-06-29 MED ORDER — VECURONIUM BROMIDE 10 MG IV SOLR
10.0000 mg | Freq: Once | INTRAVENOUS | Status: DC
  Filled 2023-06-29: qty 10

## 2023-06-29 MED ORDER — FAMOTIDINE 20 MG PO TABS
20.0000 mg | ORAL_TABLET | Freq: Two times a day (BID) | ORAL | Status: DC
  Administered 2023-06-29 – 2023-07-02 (×6): 20 mg
  Filled 2023-06-29 (×7): qty 1

## 2023-06-29 MED ORDER — ETOMIDATE 2 MG/ML IV SOLN
INTRAVENOUS | Status: AC | PRN
  Administered 2023-06-29: 100 mg via INTRAVENOUS

## 2023-06-29 MED ORDER — PROPOFOL 1000 MG/100ML IV EMUL
INTRAVENOUS | Status: AC
  Administered 2023-06-29: 5 ug/kg/min via INTRAVENOUS
  Filled 2023-06-29: qty 100

## 2023-06-29 MED ORDER — ORAL CARE MOUTH RINSE
15.0000 mL | OROMUCOSAL | Status: DC
  Administered 2023-06-29 – 2023-07-08 (×96): 15 mL via OROMUCOSAL

## 2023-06-29 MED ORDER — ORAL CARE MOUTH RINSE: 15.0000 mL | OROMUCOSAL | Status: DC | PRN

## 2023-06-29 MED ORDER — PROPOFOL 1000 MG/100ML IV EMUL
5.0000 ug/kg/min | INTRAVENOUS | Status: DC
  Administered 2023-06-30: 5 ug/kg/min via INTRAVENOUS
  Administered 2023-07-01 – 2023-07-02 (×3): 10 ug/kg/min via INTRAVENOUS
  Filled 2023-06-29 (×4): qty 100

## 2023-06-29 MED ORDER — ASPIRIN 81 MG PO CHEW
324.0000 mg | CHEWABLE_TABLET | ORAL | Status: AC
  Administered 2023-06-29: 324 mg via ORAL
  Filled 2023-06-29: qty 4

## 2023-06-29 MED ORDER — NOREPINEPHRINE 4 MG/250ML-% IV SOLN
2.0000 ug/min | INTRAVENOUS | Status: DC
  Administered 2023-06-29: 2 ug/min via INTRAVENOUS
  Administered 2023-06-30: 5 ug/min via INTRAVENOUS
  Administered 2023-06-30: 4 ug/min via INTRAVENOUS
  Administered 2023-07-01: 6 ug/min via INTRAVENOUS
  Filled 2023-06-29 (×4): qty 250

## 2023-06-29 MED ORDER — SODIUM ZIRCONIUM CYCLOSILICATE 5 G PO PACK
10.0000 g | PACK | Freq: Once | ORAL | Status: AC
  Administered 2023-06-29: 10 g
  Filled 2023-06-29: qty 2
  Filled 2023-06-29: qty 1

## 2023-06-29 MED ORDER — SODIUM CHLORIDE 0.9 % IV BOLUS
500.0000 mL | Freq: Once | INTRAVENOUS | Status: AC
  Administered 2023-06-29: 500 mL via INTRAVENOUS

## 2023-06-29 MED ORDER — SODIUM CHLORIDE 0.9 % IV SOLN
2.0000 g | Freq: Once | INTRAVENOUS | Status: AC
  Administered 2023-06-29: 2 g via INTRAVENOUS
  Filled 2023-06-29: qty 20

## 2023-06-29 MED ORDER — LORAZEPAM 2 MG/ML IJ SOLN
2.0000 mg | Freq: Once | INTRAMUSCULAR | Status: AC
  Administered 2023-06-29: 2 mg via INTRAVENOUS
  Filled 2023-06-29: qty 1

## 2023-06-29 MED ORDER — HEPARIN SODIUM (PORCINE) 5000 UNIT/ML IJ SOLN
5000.0000 [IU] | Freq: Three times a day (TID) | INTRAMUSCULAR | Status: DC
  Administered 2023-06-29 – 2023-06-30 (×3): 5000 [IU] via SUBCUTANEOUS
  Filled 2023-06-29 (×4): qty 1

## 2023-06-29 MED ORDER — FAMOTIDINE 20 MG PO TABS: 20.0000 mg | ORAL_TABLET | Freq: Two times a day (BID) | ORAL | Status: DC

## 2023-06-29 MED ORDER — CHLORHEXIDINE GLUCONATE CLOTH 2 % EX PADS
6.0000 | MEDICATED_PAD | Freq: Every day | CUTANEOUS | Status: AC
  Administered 2023-06-29 – 2023-07-03 (×5): 6 via TOPICAL

## 2023-06-29 MED ORDER — DOCUSATE SODIUM 100 MG PO CAPS: 100.0000 mg | ORAL_CAPSULE | Freq: Two times a day (BID) | ORAL | Status: DC | PRN

## 2023-06-29 MED ORDER — ASPIRIN 300 MG RE SUPP: 300.0000 mg | RECTAL | Status: AC

## 2023-06-29 MED ORDER — FENTANYL 2500MCG IN NS 250ML (10MCG/ML) PREMIX INFUSION
0.0000 ug/h | INTRAVENOUS | Status: DC
  Administered 2023-06-29: 25 ug/h via INTRAVENOUS
  Administered 2023-06-30 – 2023-07-01 (×2): 150 ug/h via INTRAVENOUS
  Administered 2023-07-01: 200 ug/h via INTRAVENOUS
  Filled 2023-06-29 (×3): qty 250

## 2023-06-29 MED ORDER — SODIUM CHLORIDE 0.9 % IV SOLN
500.0000 mg | Freq: Once | INTRAVENOUS | Status: AC
  Administered 2023-06-29: 500 mg via INTRAVENOUS
  Filled 2023-06-29 (×2): qty 5

## 2023-06-29 NOTE — Consult Note (Signed)
 PHARMACY CONSULT NOTE - ELECTROLYTES  Pharmacy Consult for Electrolyte Monitoring and Replacement   Recent Labs: Weight: 109 kg (240 lb 4.8 oz) Estimated Creatinine Clearance: 57.9 mL/min (A) (by C-G formula based on SCr of 1.39 mg/dL (H)). Potassium (mmol/L)  Date Value  06/29/2023 7.4 (HH)  07/12/2011 4.4   Magnesium (mg/dL)  Date Value  32/44/0102 2.5 (H)   Calcium (mg/dL)  Date Value  72/53/6644 9.3   Calcium, Total (mg/dL)  Date Value  03/47/4259 8.8   Albumin (g/dL)  Date Value  56/38/7564 2.8 (L)   Phosphorus (mg/dL)  Date Value  33/29/5188 4.2   Sodium (mmol/L)  Date Value  06/29/2023 134 (L)  07/12/2011 141   Assessment  [name] [name]. is a 68 y.o. male presenting with unresponsive and resp failure. PMH significant for CHF, COPD, CAD, diabetes, PVD, bilateral below the knee amputations . Pharmacy has been consulted to monitor and replace electrolytes.  Diet: npo MIVF:propofol and fentanyl drip Pertinent medications: n/a  Goal of Therapy: Electrolytes WNL  Plan:  Admitted with hyperkalemia and hypermagnesemia. NS bolus given. Lokelma,dextrose 50 and insulin ordered. Will follow up K level today at 1600. Check BMP, Mg, Phos with AM labs.  Thank you for allowing pharmacy to be a part of this patient's care.  [name] PharmD, BCPS 06/29/2023 1:25 PM

## 2023-06-29 NOTE — ED Notes (Signed)
 Pt was bucking OG tube, propofol titrated up and EDP [facility] informed.

## 2023-06-29 NOTE — Code Documentation (Signed)
 Preparing to intubate.

## 2023-06-29 NOTE — ED Notes (Signed)
 Temp foley not working with monitor. Axillary temp taken at 99.3

## 2023-06-29 NOTE — ED Notes (Signed)
 [facility] EDP on way to reevaluate pt.

## 2023-06-29 NOTE — ED Notes (Signed)
Pt suctioned at this time.

## 2023-06-29 NOTE — Consult Note (Addendum)
 PHARMACY CONSULT NOTE - ELECTROLYTES  Pharmacy Consult for Electrolyte Monitoring and Replacement   Recent Labs: Height: 5\' 5"  (165.1 cm) Weight: 123.2 kg (271 lb 9.7 oz) IBW/kg (Calculated) : 61.5 Estimated Creatinine Clearance: 69 mL/min (A) (by C-G formula based on SCr of 1.25 mg/dL (H)). Potassium (mmol/L)  Date Value  06/29/2023 6.4 (HH)  07/12/2011 4.4   Magnesium (mg/dL)  Date Value  16/01/9603 2.5 (H)   Calcium (mg/dL)  Date Value  54/12/8117 8.7 (L)   Calcium, Total (mg/dL)  Date Value  14/78/2956 8.8   Albumin (g/dL)  Date Value  21/30/8657 2.8 (L)   Phosphorus (mg/dL)  Date Value  84/69/6295 4.2   Sodium (mmol/L)  Date Value  06/29/2023 135  07/12/2011 141   Assessment  [name]. is a 68 y.o. male presenting with unresponsive and resp failure. PMH significant for CHF, COPD, CAD, diabetes, PVD, bilateral below the knee amputations . Pharmacy has been consulted to monitor and replace electrolytes.  Diet: npo MIVF:propofol and fentanyl drip Pertinent medications: n/a  Goal of Therapy: Electrolytes WNL  Plan:  K remains elevated at 6.4 (down from 7.4). MD ordered D50 + IV aspart Repeat BMP 4 hours after above order given - per MD orders Check BMP, Mg, Phos with AM labs.  Thank you for allowing pharmacy to be a part of this patient's care.  Afifa Truax Rodriguez-Guzman PharmD, BCPS 06/29/2023 7:06 PM

## 2023-06-29 NOTE — TOC Initial Note (Addendum)
 Transition of Care (TOC) - Initial/Assessment Note    Patient Details  Name: [name]. MRN: [id number] Date of Birth: 1955-12-10  Transition [facility]) CM/SW Contact:    Liliana Cline, LCSW Phone Number: 06/29/2023, 4:56 PM  Clinical Narrative:                 Patient known to [facility] for recent admission. Patient is from home with his wife, patient uses EMS transport when discharged home. This CSW arranged [facility] on 2/16 per wife's request. CSW called Bjorn Loser with [facility] - she states she will check tomorrow to confirm if they are still open with patient.  Patient is currently intubated in the ICU. TOC will follow for needs during this admission.  Expected Discharge Plan: Home w Home Health Services Barriers to Discharge: Continued Medical Work up   Patient Goals and CMS Choice            Expected Discharge Plan and Services       Living arrangements for the past 2 months: Single Family Home                                      Prior Living Arrangements/Services Living arrangements for the past 2 months: Single Family Home Lives with:: Spouse Patient language and need for interpreter reviewed:: Yes        Need for Family Participation in Patient Care: Yes (Comment) Care giver support system in place?: Yes (comment)   Criminal Activity/Legal Involvement Pertinent to Current Situation/Hospitalization: No - Comment as needed  Activities of Daily Living      Permission Sought/Granted                  Emotional Assessment         Alcohol / Substance Use: Not Applicable Psych Involvement: No (comment)  Admission diagnosis:  Acute respiratory failure with hypoxia (HCC) [J96.01] Pulmonary infiltrate in right lung on CXR [R91.8] Patient Active Problem List   Diagnosis Date Noted   Pulmonary infiltrate in right lung on CXR 06/29/2023   Chest pain 06/14/2023   SOB (shortness of breath) 04/23/2023   Malignant neoplasm  of lower third of esophagus (HCC) 04/11/2023   Palliative care encounter 04/11/2023   Atrial flutter with rapid ventricular response (HCC) 04/07/2023   Diarrhea 04/05/2023   Perinephric fluid collection 04/05/2023   Persistent atrial fibrillation (HCC) 04/05/2023   Typical atrial flutter (HCC) 04/05/2023   Diarrhea of infectious origin 04/05/2023   Acute hyperkalemia 03/28/2023   Esophageal adenocarcinoma (HCC) 03/15/2023   Dysphagia 03/14/2023   Coronary artery disease 03/14/2023   Type 2 diabetes mellitus with peripheral neuropathy (HCC) 03/14/2023   Status post below-knee amputation of right lower extremity (HCC) 05/31/2022   S/P amputation 05/30/2022   Poorly controlled diabetes mellitus (HCC) 05/30/2022   Acute osteomyelitis of right foot (HCC) 05/19/2022   Wound infection 05/18/2022   Ulcer of right ankle, with fat layer exposed (HCC) 05/18/2022   Non-healing wound of lower extremity, initial encounter 05/18/2022   CAD with Hx of CABG 05/18/2022   Chronic respiratory failure with hypoxia (HCC) 05/18/2022   S/P BKA (below knee amputation), left (HCC) 05/18/2022   COPD (chronic obstructive pulmonary disease) (HCC) 05/18/2022   Perinephric hematoma 03/20/2022   Bacteremia due to Klebsiella pneumoniae 03/19/2022   Hyperglycemia 03/16/2022   Septic shock (HCC) 03/16/2022   Sepsis  secondary to UTI (HCC) 03/15/2022   Acute hypoxemic respiratory failure (HCC) 02/13/2022   Atrial fibrillation with rapid ventricular response (HCC) 02/11/2022   Uncontrolled type 2 diabetes mellitus with hyperglycemia, with long-term current use of insulin (HCC) 02/11/2022   Dyslipidemia 02/11/2022   Hypothyroidism 02/11/2022   Benign prostatic hyperplasia with lower urinary tract symptoms 02/11/2022   Pressure injury of skin of heel 11/04/2021   UTI (urinary tract infection) 10/22/2021   Iron deficiency anemia 10/19/2021   Hyperkalemia 10/14/2021   Acute urinary retention 10/08/2021   Sepsis due to  methicillin resistant Staphylococcus aureus (MRSA) (HCC) 10/04/2021   Delirium 10/04/2021   Acute on chronic respiratory failure with hypoxia and hypercapnia (HCC) 10/04/2021   Acute respiratory failure with hypoxia and hypercapnia (HCC) 10/03/2021   OSA (obstructive sleep apnea) 10/03/2021   Malnutrition of moderate degree 09/29/2021   Generalized weakness 09/29/2021   Cellulitis of left foot 08/04/2021   Uncontrolled type 2 diabetes mellitus with hypoglycemia, with long-term current use of insulin (HCC) 08/04/2021   Bilateral lower extremity edema 08/04/2021   Acute osteomyelitis of lower leg, left (HCC) 08/04/2021   Atrial fibrillation, chronic (HCC) 08/04/2021   Swelling of limb 07/18/2021   CHF exacerbation (HCC) 05/08/2021   Diabetes mellitus without complication (HCC)    Atrial fibrillation with RVR (HCC)    Obesity, Class III, BMI 40-49.9 (morbid obesity) (HCC)    Venous stasis ulcer (HCC)    Sinus tachycardia    BPH (benign prostatic hyperplasia)    Chronic anticoagulation    Anasarca    Chronic diastolic CHF (congestive heart failure) (HCC)    Venous stasis of both lower extremities    Paroxysmal atrial fibrillation (HCC)    Lower extremity cellulitis 01/03/2021   Chronic osteomyelitis of left foot (HCC) 01/03/2021   Osteomyelitis (HCC) 01/03/2021   Severe sepsis (HCC) 02/24/2020   Acute kidney injury (HCC) 02/24/2020   Bilateral cellulitis of lower leg 02/23/2020   CAD (coronary artery disease) 02/02/2020   RLS (restless legs syndrome) 05/15/2019   Statin myopathy 05/15/2019   Type 2 diabetes mellitus with obesity (HCC) 02/13/2016   Essential hypertension 02/13/2016   PAD (peripheral artery disease) (HCC) 02/13/2016   Pressure injury of skin 01/25/2016   Hypoglycemia associated with diabetes (HCC) 12/27/2013   Long-term insulin use (HCC) 12/27/2013   Microalbuminuria 12/27/2013   B12 deficiency 09/29/2013   Obesity 09/29/2013   CAD (coronary artery disease),  autologous vein bypass graft 07/20/2013   Hypersomnia with sleep apnea 07/20/2013   Pure hypercholesterolemia 07/20/2013   Osteomyelitis of ankle or foot 07/20/2011   PCP:  [name], MD Pharmacy:   [facility] - [place], [place] - [address] [place] [address] Phone: [phone] Fax: [phone]     Social Drivers of Health (SDOH) Social History: SDOH Screenings   Food Insecurity: No Food Insecurity (06/14/2023)  Housing: Low Risk  (06/14/2023)  Transportation Needs: No Transportation Needs (06/14/2023)  Utilities: Not At Risk (06/14/2023)  Depression (PHQ2-9): Low Risk  (11/21/2021)  Financial Resource Strain: Low Risk  (09/28/2022)   Received from [facility]  Social Connections: Socially Isolated (06/14/2023)  Tobacco Use: Medium Risk (06/14/2023)   SDOH Interventions:     Readmission Risk Interventions    02/13/2022    4:57 PM 09/29/2021   11:12 AM  Readmission Risk Prevention Plan  Transportation Screening Complete Complete  PCP or Specialist Appt within 3-5 Days  Complete  HRI  or Home Care Consult  Complete  Social Work Consult for Recovery Care Planning/Counseling  Complete  Palliative Care Screening  Not Applicable  Medication Review Oceanographer) Complete Complete  PCP or Specialist appointment within 3-5 days of discharge Complete   HRI or Home Care Consult Complete   SW Recovery Care/Counseling Consult Complete   Palliative Care Screening Not Applicable   Skilled Nursing Facility Not Applicable

## 2023-06-29 NOTE — ED Triage Notes (Signed)
 pt to ED from home [facility] for SOB  pt being bagged with ambubag upon EMS arrival  80% on RA with EMS, NRB placed 90%, wide complexes on EKG 1 amp calcium chloride, 2 amps bicarb given R humoral head IO is only accecss, pt is bilateral BKA 92/64, 171 CBG, NS given by EMS  pt became nresponsive on way to ED, groans to pain  EDP at bedside PERRL noted

## 2023-06-29 NOTE — H&P (Signed)
 CRITICAL CARE     Name: [name]. MRN: [id number] DOB: 16-Dec-1955     LOS: 0   SUBJECTIVE FINDINGS & SIGNIFICANT EVENTS    History of Presenting Illness:  This is a 68 year old male with a history of cardiac dysfunction with atrial fibrillation coronary artery disease status post CABG and CHF, esophageal cancer status post 20 rounds of chemotherapy with 5 additional rounds planned, PEG tube status, diabetes, gout, dyslipidemia morbid obesity OSA on CPAP, bilateral above-knee amputee on wheelchair at baseline, patient is intubated on mechanical ventilation and history was obtained from wife at bedside.  She reports few days prior to current admission patient has been feeling epigastric discomfort and had his PEG tube evaluated by nurse with oozing of discolored brown foul-smelling discharge around PEG tube site.  This was evaluated further by GI provider who explained this may not need additional treatment.  Patient and wife were still concerned to seek additional evaluation with the primary care and were able to obtain antimicrobial round of doxycycline.  The following day patient progressively became more weak and had mechanical fall without outstretched hand falling on his daily and wife found him face down in living room.  He had respiratory distress in the field was noted to be hypoxemic and required bagging by EMS.  In the emergency department patient progressively became worse and required endotracheal intubation.  His workup including CXR shows right lower lobe infiltrate and he is currently being treated for pneumonia empirically.  He had blood work done with findings of acute kidney injury with hyperkalemia on BMP, moderate elevation of BNP over 556 in the context of BMI of 40.  Lines/tubes : Airway 8 mm  (Active)  Secured at (cm) 26 cm 06/29/23 1140  Measured From Lips 06/29/23 1140  Secured Location Center 06/29/23 1140  Secured By [employer] 06/29/23 1140  Bite Block No 06/29/23 1140  Prone position No 06/29/23 1140  Cuff Pressure (cm H2O) MOV (Manual Technique) 06/29/23 1140     Implanted Port 03/18/23 Right Chest (Active)     NG/OG Vented/Dual Lumen 16 Fr. Oral External length of tube 55 cm (Active)  Tube Position (Required) External length of tube 06/29/23 1212  Measurement (cm) (Required) 55 cm 06/29/23 1212  Site Assessment Clean, Dry, Intact 06/29/23 1212  Status Low continuous suction 06/29/23 1212  Drainage Appearance Bile 06/29/23 1212     Gastrostomy/Enterostomy Gastrostomy 22 Fr. LUQ (Active)    Microbiology/Sepsis markers: Results for orders placed or performed during the hospital encounter of 06/14/23  Culture, blood (routine x 2)     Status: None   Collection Time: 06/14/23  3:50 AM   Specimen: BLOOD  Result Value Ref Range Status   Specimen Description BLOOD RIGHT ARM  Final   Special Requests   Final    BOTTLES DRAWN AEROBIC AND ANAEROBIC Blood Culture results may not be optimal due to an inadequate volume of blood received in culture bottles   Culture   Final    NO GROWTH 5 DAYS Performed at [facility], [address] [place], [place] [address]    Report Status 06/19/2023 FINAL  Final  Culture, blood (routine x 2)     Status: None   Collection Time: 06/14/23  3:58 AM   Specimen: BLOOD  Result Value Ref Range Status   Specimen Description BLOOD RIGHT ARM  Final   Special Requests   Final    BOTTLES DRAWN AEROBIC AND ANAEROBIC Blood Culture results  may not be optimal due to an inadequate volume of blood received in culture bottles   Culture   Final    NO GROWTH 5 DAYS Performed at [facility], [address] [place] [place], [place] [address]    Report Status 06/19/2023 FINAL  Final    Anti-infectives:  Anti-infectives  (From admission, onward)    Start     Dose/Rate Route Frequency Ordered Stop   06/29/23 1245  cefTRIAXone (ROCEPHIN) 2 g in sodium chloride 0.9 % 100 mL IVPB        2 g 200 mL/hr over 30 Minutes Intravenous  Once 06/29/23 1237     06/29/23 1245  azithromycin (ZITHROMAX) 500 mg in sodium chloride 0.9 % 250 mL IVPB        500 mg 250 mL/hr over 60 Minutes Intravenous  Once 06/29/23 1237          Consults:      PAST MEDICAL HISTORY   Past Medical History:  Diagnosis Date   Arrhythmia    atrial fibrillation   Asthma    CHF (congestive heart failure) (HCC)    COPD (chronic obstructive pulmonary disease) (HCC)    Coronary artery disease    Depression    Diabetes mellitus without complication (HCC)    Gout    History anabolic steroid use    Hyperlipidemia    Hypertension    Hypogonadism in male    MI (myocardial infarction) (HCC)    Morbid obesity (HCC)    Myocardial infarction (HCC)    Peripheral vascular disease (HCC)    Perirectal abscess    Pleurisy    Sleep apnea    CPAP at night, no oxygen   Varicella      SURGICAL HISTORY   Past Surgical History:  Procedure Laterality Date   ABDOMINAL AORTIC ANEURYSM REPAIR     ACHILLES TENDON SURGERY Left 01/10/2021   Procedure: ACHILLES LENGTHENING/KIDNER;  Surgeon: Rosetta Posner, DPM;  Location: [facility];  Service: Podiatry;  Laterality: Left;   AMPUTATION Left 10/03/2021   Procedure: AMPUTATION BELOW KNEE;  Surgeon: Renford Dills, MD;  Location: [facility];  Service: Vascular;  Laterality: Left;   AMPUTATION Right 05/24/2022   Procedure: AMPUTATION BELOW KNEE;  Surgeon: Annice Needy, MD;  Location: [facility];  Service: General;  Laterality: Right;   AMPUTATION TOE Right 02/10/2016   Procedure: AMPUTATION TOE 3RD TOE;  Surgeon: Gwyneth Revels, DPM;  Location: [facility];  Service: Podiatry;  Laterality: Right;   AMPUTATION TOE Left 02/24/2020   Procedure: AMPUTATION TOE;  Surgeon: Rosetta Posner, DPM;  Location: [facility];   Service: Podiatry;  Laterality: Left;   APPLICATION OF WOUND VAC Left 02/29/2020   Procedure: APPLICATION OF WOUND VAC;  Surgeon: Rosetta Posner, DPM;  Location: [facility];  Service: Podiatry;  Laterality: Left;   BIOPSY  03/15/2023   Procedure: BIOPSY;  Surgeon: Wyline Mood, MD;  Location: [facility] ENDOSCOPY;  Service: Gastroenterology;;   BIOPSY  04/10/2023   Procedure: BIOPSY;  Surgeon: Norma Fredrickson, Boykin Nearing, MD;  Location: [facility] ENDOSCOPY;  Service: Gastroenterology;;   CARDIAC CATHETERIZATION     CARDIOVERSION [name] 02/13/2022   Procedure: CARDIOVERSION;  Surgeon: Lamar Blinks, MD;  Location: [facility];  Service: Cardiovascular;  Laterality: [name];   CARDIOVERSION [name] 04/23/2023   Procedure: CARDIOVERSION;  Surgeon: Antonieta Iba, MD;  Location: [facility];  Service: Cardiovascular;  Laterality: [name];   COLONOSCOPY WITH PROPOFOL [name] 11/18/2015   Procedure: COLONOSCOPY WITH PROPOFOL;  Surgeon: Wilber Bihari  Mechele Collin, MD;  Location: [facility] ENDOSCOPY;  Service: Endoscopy;  Laterality: [name];   CORONARY ARTERY BYPASS GRAFT     CORONARY STENT INTERVENTION [name] 02/02/2020   Procedure: CORONARY STENT INTERVENTION;  Surgeon: Marcina Millard, MD;  Location: [facility]AS[facility] CV [facility];  Service: Cardiovascular;  Laterality: [name];   ESOPHAGOGASTRODUODENOSCOPY (EGD) WITH PROPOFOL [name] 03/15/2023   Procedure: ESOPHAGOGASTRODUODENOSCOPY (EGD) WITH PROPOFOL;  Surgeon: Wyline Mood, MD;  Location: [facility] ENDOSCOPY;  Service: Gastroenterology;  Laterality: [name];   FLEXIBLE SIGMOIDOSCOPY [name] 04/10/2023   Procedure: FLEXIBLE SIGMOIDOSCOPY;  Surgeon: Toledo, Boykin Nearing, MD;  Location: [facility] ENDOSCOPY;  Service: Gastroenterology;  Laterality: [name];   GASTROSTOMY [name] 03/18/2023   Procedure: INSERTION OF GASTROSTOMY TUBE;  Surgeon: Carolan Shiver, MD;  Location: [facility];  Service: General;  Laterality: [name];   IMPACTION REMOVAL  03/15/2023   Procedure: IMPACTION REMOVAL;  Surgeon: Wyline Mood, MD;  Location: [facility] ENDOSCOPY;  Service:  Gastroenterology;;   INCISION AND DRAINAGE Left 08/07/2021   Procedure: INCISION AND DRAINAGE-Partial Calcanectomy;  Surgeon: Rosetta Posner, DPM;  Location: [facility];  Service: Podiatry;  Laterality: Left;   IRRIGATION AND DEBRIDEMENT FOOT Left 02/29/2020   Procedure: IRRIGATION AND DEBRIDEMENT FOOT;  Surgeon: Rosetta Posner, DPM;  Location: [facility];  Service: Podiatry;  Laterality: Left;   IRRIGATION AND DEBRIDEMENT FOOT Left 02/24/2020   Procedure: IRRIGATION AND DEBRIDEMENT FOOT;  Surgeon: Rosetta Posner, DPM;  Location: [facility];  Service: Podiatry;  Laterality: Left;   KNEE ARTHROSCOPY     LEFT HEART CATH AND CORS/GRAFTS ANGIOGRAPHY [name] 02/02/2020   Procedure: LEFT HEART CATH AND CORS/GRAFTS ANGIOGRAPHY;  Surgeon: Dalia Heading, MD;  Location: [facility];  Service: Cardiovascular;  Laterality: [name];   LOWER EXTREMITY ANGIOGRAPHY Left 02/25/2020   Procedure: Lower Extremity Angiography;  Surgeon: Annice Needy, MD;  Location: [facility];  Service: Cardiovascular;  Laterality: Left;   LOWER EXTREMITY ANGIOGRAPHY Left 01/04/2021   Procedure: LOWER EXTREMITY ANGIOGRAPHY;  Surgeon: Annice Needy, MD;  Location: [facility];  Service: Cardiovascular;  Laterality: Left;   LOWER EXTREMITY ANGIOGRAPHY Right 05/21/2022   Procedure: Lower Extremity Angiography;  Surgeon: Annice Needy, MD;  Location: [facility];  Service: Cardiovascular;  Laterality: Right;   METATARSAL HEAD EXCISION Left 01/10/2021   Procedure: METATARSAL HEAD EXCISION - LEFT 5th;  Surgeon: Rosetta Posner, DPM;  Location: [facility];  Service: Podiatry;  Laterality: Left;   PERIPHERAL VASCULAR CATHETERIZATION Right 01/24/2016   Procedure: Lower Extremity Angiography;  Surgeon: Renford Dills, MD;  Location: [facility];  Service: Cardiovascular;  Laterality: Right;   PERIPHERAL VASCULAR CATHETERIZATION Right 01/25/2016   Procedure: Lower Extremity Angiography;  Surgeon: Renford Dills,  MD;  Location: [facility];  Service: Cardiovascular;  Laterality: Right;   PORTACATH PLACEMENT [name] 03/18/2023   Procedure: INSERTION PORT-A-CATH;  Surgeon: Carolan Shiver, MD;  Location: [facility];  Service: General;  Laterality: [name];   TOE AMPUTATION     TONSILLECTOMY       FAMILY HISTORY   Family History  Problem Relation Age of Onset   Hypertension Father    Coronary artery disease Father    Alcohol abuse Father    Heart failure Brother      SOCIAL HISTORY   Social History   Tobacco Use   Smoking status: Former    Current packs/day: 0.00    Average packs/day: 0.5 packs/day for 45.0 years (22.5 ttl pk-yrs)    Types: Cigarettes  Start date: 04/04/1970    Quit date: 04/05/2015    Years since quitting: 8.2   Smokeless tobacco: Never  Vaping Use   Vaping status: Every Day  Substance Use Topics   Alcohol use: Not Currently    Alcohol/week: 3.0 standard drinks of alcohol    Types: 3 Glasses of wine per week   Drug use: No     MEDICATIONS   Current Medication:  Current Facility-Administered Medications:    aspirin chewable tablet 324 mg, 324 mg, Oral, NOW **OR** aspirin suppository 300 mg, 300 mg, Rectal, NOW, Karna Christmas, Maimouna Rondeau, MD   azithromycin (ZITHROMAX) 500 mg in sodium chloride 0.9 % 250 mL IVPB, 500 mg, Intravenous, Once, Jene Every, MD   cefTRIAXone (ROCEPHIN) 2 g in sodium chloride 0.9 % 100 mL IVPB, 2 g, Intravenous, Once, Jene Every, MD, Last Rate: 200 mL/hr at 06/29/23 1303, 2 g at 06/29/23 1303   docusate sodium (COLACE) capsule 100 mg, 100 mg, Oral, BID PRN, Vida Rigger, MD   famotidine (PEPCID) tablet 20 mg, 20 mg, Per Tube, BID, Karna Christmas, Lisett Dirusso, MD   famotidine (PEPCID) tablet 20 mg, 20 mg, Per Tube, BID, Gid Schoffstall, MD   fentaNYL 10 mcg/ml infusion, , , ,    fentaNYL in NS (87mcg/ml) infusion-PREMIX, 0-400 mcg/hr, Intravenous, Continuous, Karna Christmas, Aimie Wagman, MD, Last Rate: 2.5 mL/hr at 06/29/23 1258, 25 mcg/hr  at 06/29/23 1258   heparin injection 5,000 Units, 5,000 Units, Subcutaneous, Q8H, Michalina Calbert, MD   polyethylene glycol (MIRALAX / GLYCOLAX) packet 17 g, 17 g, Oral, Daily PRN, Vida Rigger, MD   propofol (DIPRIVAN) 1000 MG/100ML infusion, 5-80 mcg/kg/min, Intravenous, Continuous, Jene Every, MD, Last Rate: 29.4 mL/hr at 06/29/23 1213, 45 mcg/kg/min at 06/29/23 1213   vecuronium (NORCURON) injection 10 mg, 10 mg, Intravenous, Once, Jene Every, MD  Current Outpatient Medications:    acetaminophen (TYLENOL) 325 MG tablet, Place 2 tablets (650 mg total) into feeding tube every 6 (six) hours as needed for mild pain (pain score 1-3) (or Fever >/= 101)., Disp: , Rfl:    amiodarone (PACERONE) 200 MG tablet, Take twice a day through 1/7 and then once daily (Patient taking differently: Take 200 mg by mouth daily. Take twice a day through 1/7 and then once daily), Disp: , Rfl:    apixaban (ELIQUIS) 5 MG TABS tablet, Place 1 tablet (5 mg total) into feeding tube 2 (two) times daily., Disp: , Rfl:    ascorbic acid (VITAMIN C) 500 MG tablet, Place 1 tablet (500 mg total) into feeding tube daily., Disp: , Rfl:    Cholecalciferol 25 MCG (1000 UT) tablet, Place 1 tablet (1,000 Units total) into feeding tube daily., Disp: , Rfl:    clonazePAM (KLONOPIN) 0.5 MG tablet, Place 1 tablet (0.5 mg total) into feeding tube 2 (two) times daily., Disp: , Rfl:    Cyanocobalamin (VITAMIN B-12) 5000 MCG TBDP, Place 10,000 mcg into feeding tube daily., Disp: , Rfl:    diltiazem (CARDIZEM CD) 300 MG 24 hr capsule, Take 300 mg by mouth daily., Disp: , Rfl:    diphenoxylate-atropine (LOMOTIL) 2.5-0.025 MG tablet, Place 1 tablet into feeding tube 4 (four) times daily., Disp: , Rfl:    ezetimibe (ZETIA) 10 MG tablet, Place 1 tablet (10 mg total) into feeding tube at bedtime., Disp: , Rfl:    ferrous sulfate 300 (60 Fe) MG/5ML syrup, Place 5 mLs (300 mg total) into feeding tube daily. (Patient taking differently: Place  6.8 mLs into feeding tube daily.), Disp: 150  mL, Rfl: 0   fiber supplement, BANATROL TF, liquid, Place 60 mLs into feeding tube 2 (two) times daily., Disp: , Rfl:    finasteride (PROSCAR) 5 MG tablet, Take 1 tablet by mouth daily., Disp: , Rfl:    furosemide (LASIX) 40 MG tablet, Take 40 mg by mouth daily., Disp: , Rfl:    gabapentin (NEURONTIN) 300 MG capsule, Take 1 capsule by mouth at bedtime., Disp: , Rfl:    HUMALOG 100 UNIT/ML injection, Inject into the skin. Sliding scale, Disp: , Rfl:    Infant Care Products (DERMACLOUD) OINT, Apply 1 Application topically daily as needed., Disp: , Rfl:    insulin glargine (LANTUS) 100 UNIT/ML injection, Inject 40 Units into the skin 2 (two) times daily., Disp: , Rfl:    isosorbide mononitrate (IMDUR) 60 MG 24 hr tablet, Take 60 mg by mouth in the morning and at bedtime., Disp: , Rfl:    levothyroxine (SYNTHROID) 25 MCG tablet, Place 1 tablet (25 mcg total) into feeding tube daily at 6 (six) AM., Disp: , Rfl:    lidocaine-prilocaine (EMLA) cream, Apply 1 Application topically as needed., Disp: 30 g, Rfl: 0   loperamide HCl (IMODIUM) 1 MG/7.5ML suspension, Place 15 mLs (2 mg total) into feeding tube every 6 (six) hours as needed for diarrhea or loose stools., Disp: , Rfl:    losartan (COZAAR) 50 MG tablet, Take 50 mg by mouth daily., Disp: , Rfl:    metoprolol tartrate (LOPRESSOR) 25 MG tablet, Take 0.5 tablets (12.5 mg total) by mouth 2 (two) times daily., Disp: , Rfl:    Multiple Vitamins-Minerals (MULTIVITAMIN WITH MINERALS) tablet, Take 1 tablet by mouth daily., Disp: , Rfl:    nitrofurantoin, macrocrystal-monohydrate, (MACROBID) 100 MG capsule, Take 100 mg by mouth 2 (two) times daily., Disp: , Rfl:    nitroGLYCERIN (NITROSTAT) 0.4 MG SL tablet, Place 1 tablet (0.4 mg total) under the tongue every 5 (five) minutes as needed for chest pain., Disp: 25 tablet, Rfl: 3   nystatin (MYCOSTATIN/NYSTOP) powder, Apply 1 Application topically 2 (two) times daily.  Under belly, Disp: , Rfl:    ondansetron (ZOFRAN) 4 MG tablet, Place 1 tablet (4 mg total) into feeding tube every 8 (eight) hours as needed for nausea., Disp: , Rfl:    pramipexole (MIRAPEX) 1 MG tablet, Place 2 tablets (2 mg total) into feeding tube at bedtime., Disp: , Rfl:    primidone (MYSOLINE) 250 MG tablet, Place 1 tablet (250 mg total) into feeding tube 2 (two) times daily., Disp: , Rfl:    tamsulosin (FLOMAX) 0.4 MG CAPS capsule, Take 0.4 mg by mouth., Disp: , Rfl:    Vitamins A & D (VITAMIN A & D) ointment, Apply 1 Application topically as needed for dry skin. Apply to stumps, Disp: , Rfl:    Water For Irrigation, Sterile (FREE WATER) SOLN, Place 30 mLs into feeding tube 6 (six) times daily., Disp: , Rfl:    zinc sulfate, 50mg  elemental zinc, 220 (50 Zn) MG capsule, Take 220 mg by mouth daily., Disp: , Rfl:     ALLERGIES   Penicillins, Statins, and Metformin and related    REVIEW OF SYSTEMS     Unable to obtain review of systems due to sedation mechanical ventilation  PHYSICAL EXAMINATION   Vital Signs: Pulse Rate:  [72-80] 72 (03/01 1156) Resp:  [14-19] 16 (03/01 1156) BP: (121-150)/(77-137) 121/77 (03/01 1140) SpO2:  [88 %-100 %] 93 % (03/01 1156) FiO2 (%):  [80 %] 80 % (03/01 1140)  Weight:  [109 kg] 109 kg (03/01 1145)  GENERAL: Age-appropriate on mechanical ventilation sedated HEAD: Normocephalic, atraumatic.  EYES: Pupils equal, round, reactive to light.  No scleral icterus.  MOUTH: Moist mucosal membrane. NECK: Supple. No thyromegaly. No nodules. No JVD.  PULMONARY: Rhonchi bilaterally worse on the right CARDIOVASCULAR: S1 and S2. Regular rate and rhythm. No murmurs, rubs, or gallops.  GASTROINTESTINAL: Soft, nontender, non-distended. No masses. Positive bowel sounds. No hepatosplenomegaly.  MUSCULOSKELETAL: No swelling, clubbing, or edema.  NEUROLOGIC: GCS 4T SKIN:intact,warm,dry   PERTINENT DATA     Infusions:  azithromycin (ZITHROMAX) 500 mg in  sodium chloride 0.9 % 250 mL IVPB     cefTRIAXone (ROCEPHIN)  IV 2 g (06/29/23 1303)   fentaNYL     fentaNYL infusion INTRAVENOUS 25 mcg/hr (06/29/23 1258)   propofol (DIPRIVAN) infusion 45 mcg/kg/min (06/29/23 1213)   Scheduled Medications:  aspirin  324 mg Oral NOW   Or   aspirin  300 mg Rectal NOW   famotidine  20 mg Per Tube BID   famotidine  20 mg Per Tube BID   heparin  5,000 Units Subcutaneous Q8H   vecuronium  10 mg Intravenous Once   PRN Medications: docusate sodium, fentaNYL, polyethylene glycol Hemodynamic parameters:   Intake/Output: No intake/output data recorded.  Ventilator  Settings: Vent Mode: PRVC FiO2 (%):  [80 %] 80 % Set Rate:  [18 bmp] 18 bmp Vt Set:  [500 mL] 500 mL PEEP:  [8 cmH20] 8 cmH20   LAB RESULTS:  Basic Metabolic Panel: Recent Labs  Lab 06/29/23 1142  NA PENDING  K PENDING  CL PENDING  CO2 PENDING  GLUCOSE PENDING  BUN PENDING  CREATININE PENDING  CALCIUM PENDING   Liver Function Tests: Recent Labs  Lab 06/29/23 1142  AST PENDING  ALT PENDING  ALKPHOS PENDING  BILITOT PENDING  PROT PENDING  ALBUMIN PENDING   No results for input(s): "LIPASE", "AMYLASE" in the last 168 hours. No results for input(s): "AMMONIA" in the last 168 hours. CBC: Recent Labs  Lab 06/24/23 1022 06/29/23 1142  WBC 8.9 7.4  HGB 10.9* 9.7*  HCT 36.9* 31.2*  MCV 102.5* 98.4  PLT 181 203   Cardiac Enzymes: No results for input(s): "CKTOTAL", "CKMB", "CKMBINDEX", "TROPONINI" in the last 168 hours. BNP: Invalid input(s): "POCBNP" CBG: No results for input(s): "GLUCAP" in the last 168 hours.     IMAGING RESULTS:     Cardiomegaly and right lower lobe infiltrate with atelectasis  ASSESSMENT AND PLAN    -Multidisciplinary rounds held today  Acute Hypoxic Respiratory Failure -continue Full MV support -continue Bronchodilator Therapy -Wean Fio2 and PEEP as tolerated -will perform SAT/SBT when respiratory parameters are met -Due to  report of complications with PEG tube aspiration pneumonia is on the differential we will empirically treat with IV antibiotics -Tracheal aspirate -MRSA PCR -Procalcitonin trend  Acute on chronic diastolic CHF -Diurese when able, currently with hypotension -Lasix as tolerated -follow up cardiac enzymes as indicated ICU monitoring  Acute renal Failure stage II KDIGO-most likely due to ATN -follow chem 7 -follow UO -continue Foley Catheter-assess need daily   PEG tube status -Wife reports malfunctioning PEG tube with discolored foul-smelling drainage around insertion site -Patient has completed course of doxycycline and outpatient -Low intermittent suction for decompression n.p.o. for now   Esophageal cancer -Chronic condition status post 20 treatments of radiation   ID -continue IV abx as prescibed-Zithromax and Rocephin -follow up cultures-blood cultures and tracheal aspirate ordered -Urinalysis and urine culture due  to recent history of UTI, current urinalysis appears hazy with trace leukocyte esterase and no bacteria seen  GI/Nutrition GI PROPHYLAXIS as indicated DIET-->TF's as tolerated Constipation protocol as indicated  ENDO - ICU hypoglycemic\Hyperglycemia protocol -check FSBS per protocol   ELECTROLYTES -follow labs as needed -replace as needed -pharmacy consultation   DVT/GI PRX ordered -SCDs  TRANSFUSIONS AS NEEDED MONITOR FSBS ASSESS the need for LABS as needed    Critical care provider statement:   Total critical care time: 62  minutes   Performed by: Karna Christmas MD   Critical care time was exclusive of separately billable procedures and treating other patients.   Critical care was necessary to treat or prevent imminent or life-threatening deterioration.   Critical care was time spent personally by me on the following activities: development of treatment plan with patient and/or surrogate as well as nursing, discussions with consultants,  evaluation of patient's response to treatment, examination of patient, obtaining history from patient or surrogate, ordering and performing treatments and interventions, ordering and review of laboratory studies, ordering and review of radiographic studies, pulse oximetry and re-evaluation of patient's condition.    Vida Rigger, M.D.  Pulmonary & Critical Care Medicine

## 2023-06-29 NOTE — ED Notes (Signed)
 Called RT to come reassess. Pt 88% on ventilator. Suctioned with y[place]keur.

## 2023-06-29 NOTE — ED Provider Notes (Signed)
 [facility] Provider Note    Event Date/Time   First MD Initiated Contact with Patient 06/29/23 1140     (approximate)   History   Respiratory Distress and unresponsive   HPI  [name]. is a 68 y.o. male with extensive past medical history including CHF, COPD, CAD, diabetes, PVD, bilateral below the knee amputations who presents with reported respiratory distress.  EMS notes that patient was hypoxic per family and route became unresponsive and they started bagging the patient     Physical Exam   Triage Vital Signs: ED Triage Vitals  Encounter Vitals Group     BP 06/29/23 1135 (!) 150/137     Systolic BP Percentile --      Diastolic BP Percentile --      Pulse Rate 06/29/23 1135 75     Resp 06/29/23 1135 14     Temp --      Temp src --      SpO2 06/29/23 1135 100 %     Weight 06/29/23 1145 109 kg (240 lb 4.8 oz)     Height --      Head Circumference --      Peak Flow --      Pain Score --      Pain Loc --      Pain Education --      Exclude from Growth Chart --     Most recent vital signs: Vitals:   06/29/23 1155 06/29/23 1156  BP:    Pulse: 76 72  Resp: 17 16  SpO2: (!) 89% 93%     General: Opens eyes to painful stimuli, EMS bagging CV:  Good peripheral perfusion.  Bilateral BKA Resp:  Assisted respirations Abd:  No distention.  Soft Other:  No evidence of trauma   ED Results / Procedures / Treatments   Labs (all labs ordered are listed, but only abnormal results are displayed) Labs Reviewed  CBC - Abnormal; Notable for the following components:      Result Value   RBC 3.17 (*)    Hemoglobin 9.7 (*)    HCT 31.2 (*)    RDW 18.0 (*)    All other components within normal limits  BRAIN NATRIURETIC PEPTIDE - Abnormal; Notable for the following components:   B Natriuretic Peptide 556.9 (*)    All other components within normal limits  URINALYSIS, ROUTINE W REFLEX MICROSCOPIC - Abnormal; Notable for the following  components:   Color, Urine YELLOW (*)    APPearance HAZY (*)    Leukocytes,Ua TRACE (*)    All other components within normal limits  CULTURE, BLOOD (ROUTINE X 2)  CULTURE, BLOOD (ROUTINE X 2)  CULTURE, BLOOD (ROUTINE X 2)  CULTURE, BLOOD (ROUTINE X 2)  CULTURE, RESPIRATORY W GRAM STAIN  COMPREHENSIVE METABOLIC PANEL  LACTIC ACID, PLASMA  LACTIC ACID, PLASMA  PROTIME-INR  APTT  BLOOD GAS, ARTERIAL  CBC  CREATININE, SERUM  LIPASE, BLOOD  LACTIC ACID, PLASMA  LACTIC ACID, PLASMA  URINALYSIS, COMPLETE (UACMP) WITH MICROSCOPIC  TROPONIN I (HIGH SENSITIVITY)     EKG  ED ECG REPORT I, Jene Every, the attending physician, personally viewed and interpreted this ECG.  Date: 06/29/2023  Rhythm:  QRS Axis: normal Intervals: Right bundle branch block ST/T Wave abnormalities: Nonspecific changes     RADIOLOGY Chest x-ray viewed interpret by me, ET tube in good position, suspect pneumonia    PROCEDURES:  Critical Care performed: yes  CRITICAL CARE  Performed by: Jene Every   Total critical care time: 30 minutes  Critical care time was exclusive of separately billable procedures and treating other patients.  Critical care was necessary to treat or prevent imminent or life-threatening deterioration.  Critical care was time spent personally by me on the following activities: development of treatment plan with patient and/or surrogate as well as nursing, discussions with consultants, evaluation of patient's response to treatment, examination of patient, obtaining history from patient or surrogate, ordering and performing treatments and interventions, ordering and review of laboratory studies, ordering and review of radiographic studies, pulse oximetry and re-evaluation of patient's condition.   Date/Time: 06/29/2023 12:15 PM  Performed by: Jene Every, MDPre-anesthesia Checklist: Patient identified, Emergency Drugs available, Suction available and Patient being  monitored Oxygen Delivery Method: Ambu bag Preoxygenation: Pre-oxygenation with 100% oxygen Induction Type: IV induction Laryngoscope Size: Glidescope and 4 Grade View: Grade III Tube size: 8.0 mm Number of attempts: 1 Placement Confirmation: ETT inserted through vocal cords under direct vision and CO2 detector Secured at: 27 cm Tube secured with: ETT holder Future Recommendations: Recommend- induction with short-acting agent, and alternative techniques readily available       MEDICATIONS ORDERED IN ED: Medications  propofol (DIPRIVAN) 1000 MG/100ML infusion (45 mcg/kg/min  109 kg Intravenous Rate/Dose Change 06/29/23 1213)  vecuronium (NORCURON) injection 10 mg (0 mg Intravenous Hold 06/29/23 1209)  cefTRIAXone (ROCEPHIN) 2 g in sodium chloride 0.9 % 100 mL IVPB (2 g Intravenous New Bag/Given 06/29/23 1303)  azithromycin (ZITHROMAX) 500 mg in sodium chloride 0.9 % 250 mL IVPB (has no administration in time range)  fentaNYL in NS (26mcg/ml) infusion-PREMIX (25 mcg/hr Intravenous New Bag/Given 06/29/23 1258)  fentaNYL 10 mcg/ml infusion (has no administration in time range)  docusate sodium (COLACE) capsule 100 mg (has no administration in time range)  polyethylene glycol (MIRALAX / GLYCOLAX) packet 17 g (has no administration in time range)  aspirin chewable tablet 324 mg (has no administration in time range)    Or  aspirin suppository 300 mg (has no administration in time range)  heparin injection 5,000 Units (has no administration in time range)  famotidine (PEPCID) tablet 20 mg (has no administration in time range)  famotidine (PEPCID) tablet 20 mg (has no administration in time range)  etomidate (AMIDATE) injection (100 mg Intravenous Given 06/29/23 1137)  LORazepam (ATIVAN) injection 2 mg (2 mg Intravenous Given 06/29/23 1156)  sodium chloride 0.9 % bolus 500 mL (500 mLs Intravenous New Bag/Given 06/29/23 1303)     IMPRESSION / MDM / ASSESSMENT AND PLAN / ED COURSE  I  reviewed the triage vital signs and the nursing notes. Patient's presentation is most consistent with acute presentation with potential threat to life or bodily function.  Patient presents unresponsive with EMS bagging the patient, full code per family.  Responds only to painful stimuli and only opens his eyes, given mental status, hypoxia, decision made to intubate  X-ray suspicious for aspiration versus pneumonia, will cover with IV antibiotics, blood cultures, lactic pending  Postintubation blood pressure is soft, will give bolus of IV fluids and monitor carefully  Lactic acid is normal, white blood cell count is normal.  Updated the family, they also report that patient had a fall out of his wheelchair yesterday, will send for CT head  ----------------------------------------- 1:00 PM on 06/29/2023 -----------------------------------------  Discussed with Dr. Richardson Dopp of ICU for admission        FINAL CLINICAL IMPRESSION(S) / ED DIAGNOSES   Final diagnoses:  Acute respiratory failure with hypoxia (HCC)     Rx / DC Orders   ED Discharge Orders     None        Note:  This document was prepared using Dragon voice recognition software and may include unintentional dictation errors.   Jene Every, MD 06/29/23 [phone]

## 2023-06-29 NOTE — Plan of Care (Signed)
  Problem: Clinical Measurements: Goal: Ability to maintain clinical measurements within normal limits will improve Outcome: Progressing Goal: Diagnostic test results will improve Outcome: Progressing Goal: Respiratory complications will improve Outcome: Progressing Goal: Cardiovascular complication will be avoided Outcome: Progressing   Problem: Coping: Goal: Level of anxiety will decrease Outcome: Progressing   Problem: Elimination: Goal: Will not experience complications related to urinary retention Outcome: Progressing   Problem: Pain Managment: Goal: General experience of comfort will improve and/or be controlled Outcome: Progressing   Problem: Safety: Goal: Ability to remain free from injury will improve Outcome: Progressing   Problem: Activity: Goal: Risk for activity intolerance will decrease Outcome: Not Progressing- intubated/ sedated   Problem: Nutrition: Goal: Adequate nutrition will be maintained Outcome: Not Progressing- NPO

## 2023-06-30 LAB — BASIC METABOLIC PANEL
Anion gap: 6 (ref 5–15)
Anion gap: 6 (ref 5–15)
Anion gap: 7 (ref 5–15)
Anion gap: 7 (ref 5–15)
Anion gap: 7 (ref 5–15)
BUN: 50 mg/dL — ABNORMAL HIGH (ref 8–23)
BUN: 54 mg/dL — ABNORMAL HIGH (ref 8–23)
BUN: 60 mg/dL — ABNORMAL HIGH (ref 8–23)
BUN: 62 mg/dL — ABNORMAL HIGH (ref 8–23)
BUN: 72 mg/dL — ABNORMAL HIGH (ref 8–23)
CO2: 27 mmol/L (ref 22–32)
CO2: 27 mmol/L (ref 22–32)
CO2: 28 mmol/L (ref 22–32)
CO2: 29 mmol/L (ref 22–32)
CO2: 29 mmol/L (ref 22–32)
Calcium: 8.7 mg/dL — ABNORMAL LOW (ref 8.9–10.3)
Calcium: 8.7 mg/dL — ABNORMAL LOW (ref 8.9–10.3)
Calcium: 8.7 mg/dL — ABNORMAL LOW (ref 8.9–10.3)
Calcium: 8.8 mg/dL — ABNORMAL LOW (ref 8.9–10.3)
Calcium: 8.9 mg/dL (ref 8.9–10.3)
Chloride: 100 mmol/L (ref 98–111)
Chloride: 100 mmol/L (ref 98–111)
Chloride: 102 mmol/L (ref 98–111)
Chloride: 102 mmol/L (ref 98–111)
Chloride: 102 mmol/L (ref 98–111)
Creatinine, Ser: 1.15 mg/dL (ref 0.61–1.24)
Creatinine, Ser: 1.19 mg/dL (ref 0.61–1.24)
Creatinine, Ser: 1.21 mg/dL (ref 0.61–1.24)
Creatinine, Ser: 1.22 mg/dL (ref 0.61–1.24)
Creatinine, Ser: 1.28 mg/dL — ABNORMAL HIGH (ref 0.61–1.24)
GFR, Estimated: >60 mL/min (ref >60)
GFR, Estimated: >60 mL/min (ref >60)
GFR, Estimated: >60 mL/min (ref >60)
GFR, Estimated: >60 mL/min (ref >60)
GFR, Estimated: >60 mL/min (ref >60)
Glucose, Bld: 114 mg/dL — ABNORMAL HIGH (ref 70–99)
Glucose, Bld: 69 mg/dL — ABNORMAL LOW (ref 70–99)
Glucose, Bld: 84 mg/dL (ref 70–99)
Glucose, Bld: 87 mg/dL (ref 70–99)
Glucose, Bld: 88 mg/dL (ref 70–99)
Potassium: 5.6 mmol/L — ABNORMAL HIGH (ref 3.5–5.1)
Potassium: 5.8 mmol/L — ABNORMAL HIGH (ref 3.5–5.1)
Potassium: 6 mmol/L — ABNORMAL HIGH (ref 3.5–5.1)
Potassium: 6 mmol/L — ABNORMAL HIGH (ref 3.5–5.1)
Potassium: 6.2 mmol/L — ABNORMAL HIGH (ref 3.5–5.1)
Sodium: 135 mmol/L (ref 135–145)
Sodium: 135 mmol/L (ref 135–145)
Sodium: 136 mmol/L (ref 135–145)
Sodium: 136 mmol/L (ref 135–145)
Sodium: 137 mmol/L (ref 135–145)

## 2023-06-30 LAB — RESPIRATORY PANEL BY PCR

## 2023-06-30 LAB — GLUCOSE, CAPILLARY
Glucose-Capillary: 100 mg/dL — ABNORMAL HIGH (ref 70–99)
Glucose-Capillary: 116 mg/dL — ABNORMAL HIGH (ref 70–99)
Glucose-Capillary: 122 mg/dL — ABNORMAL HIGH (ref 70–99)
Glucose-Capillary: 66 mg/dL — ABNORMAL LOW (ref 70–99)
Glucose-Capillary: 69 mg/dL — ABNORMAL LOW (ref 70–99)
Glucose-Capillary: 73 mg/dL (ref 70–99)
Glucose-Capillary: 82 mg/dL (ref 70–99)
Glucose-Capillary: 84 mg/dL (ref 70–99)
Glucose-Capillary: 88 mg/dL (ref 70–99)
Glucose-Capillary: 91 mg/dL (ref 70–99)

## 2023-06-30 LAB — PROCALCITONIN: Procalcitonin: 0.33 ng/mL

## 2023-06-30 LAB — CBC
HCT: 28.2 % — ABNORMAL LOW (ref 39.0–52.0)
Hemoglobin: 9 g/dL — ABNORMAL LOW (ref 13.0–17.0)
MCH: 30.7 pg (ref 26.0–34.0)
MCHC: 31.9 g/dL (ref 30.0–36.0)
MCV: 96.2 fL (ref 80.0–100.0)
Platelets: 195 10*3/uL (ref 150–400)
RBC: 2.93 MIL/uL — ABNORMAL LOW (ref 4.22–5.81)
RDW: 18 % — ABNORMAL HIGH (ref 11.5–15.5)
WBC: 6.3 10*3/uL (ref 4.0–10.5)
nRBC: 0 % (ref 0.0–0.2)

## 2023-06-30 LAB — MAGNESIUM: Magnesium: 2.6 mg/dL — ABNORMAL HIGH (ref 1.7–2.4)

## 2023-06-30 LAB — PHOSPHORUS: Phosphorus: 3.4 mg/dL (ref 2.5–4.6)

## 2023-06-30 LAB — APTT: aPTT: 77 s — ABNORMAL HIGH (ref 24–36)

## 2023-06-30 MED ORDER — DEXTROSE 50 % IV SOLN
50.0000 mL | Freq: Once | INTRAVENOUS | Status: AC
  Administered 2023-07-01: 50 mL via INTRAVENOUS
  Filled 2023-06-30: qty 50

## 2023-06-30 MED ORDER — INSULIN ASPART 100 UNIT/ML IV SOLN
10.0000 [IU] | Freq: Once | INTRAVENOUS | Status: AC
  Administered 2023-06-30: 10 [IU] via INTRAVENOUS
  Filled 2023-06-30: qty 0.1

## 2023-06-30 MED ORDER — DEXTROSE 50 % IV SOLN
INTRAVENOUS | Status: AC
  Filled 2023-06-30: qty 50

## 2023-06-30 MED ORDER — DEXTROSE 50 % IV SOLN
12.5000 g | INTRAVENOUS | Status: AC
  Administered 2023-06-30: 12.5 g via INTRAVENOUS

## 2023-06-30 MED ORDER — HEPARIN BOLUS VIA INFUSION
4500.0000 [IU] | Freq: Once | INTRAVENOUS | Status: AC
  Administered 2023-06-30: 4500 [IU] via INTRAVENOUS
  Filled 2023-06-30: qty 4500

## 2023-06-30 MED ORDER — INSULIN ASPART 100 UNIT/ML IV SOLN
10.0000 [IU] | Freq: Once | INTRAVENOUS | Status: AC
  Administered 2023-07-01: 10 [IU] via INTRAVENOUS
  Filled 2023-06-30: qty 0.1

## 2023-06-30 MED ORDER — DEXTROSE 50 % IV SOLN
1.0000 | Freq: Once | INTRAVENOUS | Status: AC
  Administered 2023-06-30: 50 mL via INTRAVENOUS
  Filled 2023-06-30: qty 50

## 2023-06-30 MED ORDER — HEPARIN (PORCINE) 25000 UT/250ML-% IV SOLN
1700.0000 [IU]/h | INTRAVENOUS | Status: DC
  Administered 2023-06-30 – 2023-07-01 (×3): 1500 [IU]/h via INTRAVENOUS
  Administered 2023-07-02: 1700 [IU]/h via INTRAVENOUS
  Filled 2023-06-30 (×4): qty 250

## 2023-06-30 MED ORDER — MIDAZOLAM HCL 2 MG/2ML IJ SOLN
2.0000 mg | INTRAMUSCULAR | Status: DC | PRN
Start: 2023-06-30 — End: 2023-07-02
  Administered 2023-06-30 – 2023-07-01 (×4): 2 mg via INTRAVENOUS
  Filled 2023-06-30 (×4): qty 2

## 2023-06-30 MED ORDER — DEXTROSE 50 % IV SOLN
12.5000 g | INTRAVENOUS | Status: AC
  Administered 2023-06-30: 12.5 g via INTRAVENOUS
  Filled 2023-06-30: qty 50

## 2023-06-30 MED ORDER — LEVOTHYROXINE SODIUM 25 MCG PO TABS
25.0000 ug | ORAL_TABLET | Freq: Every day | ORAL | Status: DC
Start: 2023-07-01 — End: 2023-07-08
  Administered 2023-07-01 – 2023-07-08 (×8): 25 ug
  Filled 2023-06-30 (×8): qty 1

## 2023-06-30 MED ORDER — SODIUM ZIRCONIUM CYCLOSILICATE 5 G PO PACK
10.0000 g | PACK | Freq: Two times a day (BID) | ORAL | Status: DC
  Administered 2023-06-30 – 2023-07-02 (×6): 10 g
  Filled 2023-06-30 (×6): qty 2

## 2023-06-30 MED ORDER — SODIUM CHLORIDE 0.9 % IV BOLUS
500.0000 mL | Freq: Once | INTRAVENOUS | Status: AC
  Administered 2023-06-30: 500 mL via INTRAVENOUS

## 2023-06-30 NOTE — Progress Notes (Signed)
 PHARMACY - ANTICOAGULATION CONSULT NOTE  Pharmacy Consult for heparin infusion Indication: atrial fibrillation  Allergies  Allergen Reactions   Penicillins Other (See Comments)    Happened at 68 years old and pt. stated he passed out  He has tolerated amoxicillin/clavulanate and ampicillin/sulbactam   Statins     Other reaction(s): Muscle Pain Causes legs to ache per pt   Metformin And Related Diarrhea    Patient Measurements: Height: 5\' 5"  (165.1 cm) Weight: 121.9 kg (268 lb 11.9 oz) IBW/kg (Calculated) : 61.5 Heparin Dosing Weight: 90.8 kg  Vital Signs: Temp: 99.5 F (37.5 C) (03/02 1000) Temp Source: Bladder (03/02 0800) BP: 107/64 (03/02 1000) Pulse Rate: 80 (03/02 1000)  Labs: Recent Labs    06/29/23 1142 06/29/23 1240 06/29/23 1824 06/30/23 0008 06/30/23 0534  HGB 9.7*  --   --   --  9.0*  HCT 31.2*  --   --   --  28.2*  PLT 203  --   --   --  195  APTT  --  40*  --   --   --   LABPROT  --  16.2*  --   --   --   INR  --  1.3*  --   --   --   CREATININE 1.39*  --  1.25* 1.28* 1.19  TROPONINIHS 11  --   --   --   --     Estimated Creatinine Clearance: 72 mL/min (by C-G formula based on SCr of 1.19 mg/dL).   Medical History: Past Medical History:  Diagnosis Date   Arrhythmia    atrial fibrillation   Asthma    CHF (congestive heart failure) (HCC)    COPD (chronic obstructive pulmonary disease) (HCC)    Coronary artery disease    Depression    Diabetes mellitus without complication (HCC)    Gout    History anabolic steroid use    Hyperlipidemia    Hypertension    Hypogonadism in male    MI (myocardial infarction) (HCC)    Morbid obesity (HCC)    Myocardial infarction (HCC)    Peripheral vascular disease (HCC)    Perirectal abscess    Pleurisy    Sleep apnea    CPAP at night, no oxygen   Varicella     Medications:  Scheduled:   Chlorhexidine Gluconate Cloth  6 each Topical Daily   famotidine  20 mg Per Tube BID   heparin  5,000 Units  Subcutaneous Q8H   mupirocin ointment  1 Application Nasal BID   mouth rinse  15 mL Mouth Rinse Q2H   sodium zirconium cyclosilicate  10 g Per Tube BID   vecuronium  10 mg Intravenous Once    Assessment: 68 y.o. male presenting with unresponsive and resp failure. PMH significant for CHF, COPD, CAD, diabetes, PVD, bilateral below the knee amputations. He takes apixaban PTA with last dose 06/28/23 pm  Goal of Therapy:  anti-Xa level  0.3-0.7 units/ml aPTT 66 - 102 seconds Monitor platelets by anticoagulation protocol: Yes   Plan:  ---Give 4500 units IV heparin bolus x 1 ---Start heparin infusion at 1500 units/hr ---Check aPTT in 6 hours and at least once daily while on heparin: we will use aPTT to guide therapy until aPTT and anti-Xa level correlate ---Continue to monitor H&H and platelets  [name] 06/30/2023,12:22 PM

## 2023-06-30 NOTE — Progress Notes (Signed)
 CRITICAL CARE     Name: [name]. MRN: [id number] DOB: [birth year]     LOS: 1   SUBJECTIVE FINDINGS & SIGNIFICANT EVENTS    History of Presenting Illness:  This is a 68 year old male with a history of cardiac dysfunction with atrial fibrillation coronary artery disease status post CABG and CHF, esophageal cancer status post 20 rounds of chemotherapy with 5 additional rounds planned, PEG tube status, diabetes, gout, dyslipidemia morbid obesity OSA on CPAP, bilateral above-knee amputee on wheelchair at baseline, patient is intubated on mechanical ventilation and history was obtained from wife at bedside.  She reports few days prior to current admission patient has been feeling epigastric discomfort and had his PEG tube evaluated by nurse with oozing of discolored brown foul-smelling discharge around PEG tube site.  This was evaluated further by GI provider who explained this may not need additional treatment.  Patient and wife were still concerned to seek additional evaluation with the primary care and were able to obtain antimicrobial round of doxycycline.  The following day patient progressively became more weak and had mechanical fall without outstretched hand falling on his daily and wife found him face down in living room.  He had respiratory distress in the field was noted to be hypoxemic and required bagging by EMS.  In the emergency department patient progressively became worse and required endotracheal intubation.  His workup including CXR shows right lower lobe infiltrate and he is currently being treated for pneumonia empirically.  He had blood work done with findings of acute kidney injury with hyperkalemia on BMP, moderate elevation of BNP over 556 in the context of BMI of 40.   06/30/23-  no acute events  overnight, remains on PRVC/  bloodwork reviewed with no concerning new findings  Lines/tubes : Airway 8 mm (Active)  Secured at (cm) 26 cm 06/29/23 1140  Measured From Lips 06/29/23 1140  Secured Location Center 06/29/23 1140  Secured By [employer] 06/29/23 1140  Bite Block No 06/29/23 1140  Prone position No 06/29/23 1140  Cuff Pressure (cm H2O) MOV (Manual Technique) 06/29/23 1140     Implanted Port 03/18/23 Right Chest (Active)     NG/OG Vented/Dual Lumen 16 Fr. Oral External length of tube 55 cm (Active)  Tube Position (Required) External length of tube 06/29/23 1212  Measurement (cm) (Required) 55 cm 06/29/23 1212  Site Assessment Clean, Dry, Intact 06/29/23 1212  Status Low continuous suction 06/29/23 1212  Drainage Appearance Bile 06/29/23 1212     Gastrostomy/Enterostomy Gastrostomy 22 Fr. LUQ (Active)    Microbiology/Sepsis markers: Results for orders placed or performed during the hospital encounter of 06/29/23  Blood culture (routine x 2)     Status: None (Preliminary result)   Collection Time: 06/29/23 11:45 AM   Specimen: BLOOD  Result Value Ref Range Status   Specimen Description BLOOD BLOOD RIGHT ARM  Final   Special Requests   Final    BOTTLES DRAWN AEROBIC AND ANAEROBIC Blood Culture results may not be optimal due to an inadequate volume of blood received in culture bottles   Culture   Final    NO GROWTH < 24 HOURS Performed at [facility], [address] [place], [place] [address]    Report Status PENDING  Incomplete  MRSA Next Gen by PCR, Nasal     Status: Abnormal   Collection Time: 06/29/23  2:45 PM   Specimen: Nasal Mucosa; Nasal Swab  Result Value Ref Range Status  MRSA by PCR Next Gen DETECTED (A) NOT DETECTED Final    Comment: RESULT CALLED TO, READ BACK BY AND VERIFIED WITH: ALISHA SAWYER/RN AT 1626 ON [id number] BY SKY (NOTE) The GeneXpert MRSA Assay (FDA approved for NASAL specimens only), is one component of a  comprehensive MRSA colonization surveillance program. It is not intended to diagnose MRSA infection nor to guide or monitor treatment for MRSA infections. Test performance is not FDA approved in patients less than 55 years old. Performed at [facility], [address] [place], [place] [address]   Resp panel by RT-PCR (RSV, Flu A&B, Covid) Anterior Nasal Swab     Status: None   Collection Time: 06/29/23  2:45 PM   Specimen: Anterior Nasal Swab  Result Value Ref Range Status   SARS Coronavirus 2 by RT PCR NEGATIVE NEGATIVE Final    Comment: (NOTE) SARS-CoV-2 target nucleic acids are NOT DETECTED.  The SARS-CoV-2 RNA is generally detectable in upper respiratory specimens during the acute phase of infection. The lowest concentration of SARS-CoV-2 viral copies this assay can detect is 138 copies/mL. A negative result does not preclude SARS-Cov-2 infection and should not be used as the sole basis for treatment or other patient management decisions. A negative result may occur with  improper specimen collection/handling, submission of specimen other than nasopharyngeal swab, presence of viral mutation(s) within the areas targeted by this assay, and inadequate number of viral copies(<138 copies/mL). A negative result must be combined with clinical observations, patient history, and epidemiological information. The expected result is Negative.  Fact Sheet for Patients:  BloggerCourse.com  Fact Sheet for Healthcare Providers:  SeriousBroker.it  This test is no t yet approved or cleared by the [place] FDA and  has been authorized for detection and/or diagnosis of SARS-CoV-2 by FDA under an Emergency Use Authorization (EUA). This EUA will remain  in effect (meaning this test can be used) for the duration of the COVID-19 declaration under Section 564(b)(1) of the Act, 21 U.S.C.section 360bbb-3(b)(1), unless the authorization  is terminated  or revoked sooner.       Influenza A by PCR NEGATIVE NEGATIVE Final   Influenza B by PCR NEGATIVE NEGATIVE Final    Comment: (NOTE) The Xpert Xpress SARS-CoV-2/FLU/RSV plus assay is intended as an aid in the diagnosis of influenza from Nasopharyngeal swab specimens and should not be used as a sole basis for treatment. Nasal washings and aspirates are unacceptable for Xpert Xpress SARS-CoV-2/FLU/RSV testing.  Fact Sheet for Patients: BloggerCourse.com  Fact Sheet for Healthcare Providers: SeriousBroker.it  This test is not yet approved or cleared by the [place] FDA and has been authorized for detection and/or diagnosis of SARS-CoV-2 by FDA under an Emergency Use Authorization (EUA). This EUA will remain in effect (meaning this test can be used) for the duration of the COVID-19 declaration under Section 564(b)(1) of the Act, 21 U.S.C. section 360bbb-3(b)(1), unless the authorization is terminated or revoked.     Resp Syncytial Virus by PCR NEGATIVE NEGATIVE Final    Comment: (NOTE) Fact Sheet for Patients: BloggerCourse.com  Fact Sheet for Healthcare Providers: SeriousBroker.it  This test is not yet approved or cleared by the [place] FDA and has been authorized for detection and/or diagnosis of SARS-CoV-2 by FDA under an Emergency Use Authorization (EUA). This EUA will remain in effect (meaning this test can be used) for the duration of the COVID-19 declaration under Section 564(b)(1) of the Act, 21 U.S.C. section 360bbb-3(b)(1), unless the authorization is terminated or  revoked.  Performed at [facility], [address] [place], [place] [address]   Blood culture (routine x 2)     Status: None (Preliminary result)   Collection Time: 06/29/23  4:06 PM   Specimen: BLOOD  Result Value Ref Range Status   Specimen Description BLOOD BLOOD  LEFT HAND  Final   Special Requests   Final    BOTTLES DRAWN AEROBIC AND ANAEROBIC Blood Culture adequate volume   Culture   Final    NO GROWTH < 24 HOURS Performed at [facility], [address] [place], [place] [address]    Report Status PENDING  Incomplete    Anti-infectives:  Anti-infectives (From admission, onward)    Start     Dose/Rate Route Frequency Ordered Stop   06/29/23 1245  cefTRIAXone (ROCEPHIN) 2 g in sodium chloride 0.9 % 100 mL IVPB        2 g 200 mL/hr over 30 Minutes Intravenous  Once 06/29/23 1237 06/29/23 1345   06/29/23 1245  azithromycin (ZITHROMAX) 500 mg in sodium chloride 0.9 % 250 mL IVPB        500 mg 250 mL/hr over 60 Minutes Intravenous  Once 06/29/23 1237 06/29/23 1743        Consults:      PAST MEDICAL HISTORY   Past Medical History:  Diagnosis Date   Arrhythmia    atrial fibrillation   Asthma    CHF (congestive heart failure) (HCC)    COPD (chronic obstructive pulmonary disease) (HCC)    Coronary artery disease    Depression    Diabetes mellitus without complication (HCC)    Gout    History anabolic steroid use    Hyperlipidemia    Hypertension    Hypogonadism in male    MI (myocardial infarction) (HCC)    Morbid obesity (HCC)    Myocardial infarction (HCC)    Peripheral vascular disease (HCC)    Perirectal abscess    Pleurisy    Sleep apnea    CPAP at night, no oxygen   Varicella      SURGICAL HISTORY   Past Surgical History:  Procedure Laterality Date   ABDOMINAL AORTIC ANEURYSM REPAIR     ACHILLES TENDON SURGERY Left 01/10/2021   Procedure: ACHILLES LENGTHENING/KIDNER;  Surgeon: Rosetta Posner, DPM;  Location: [facility];  Service: Podiatry;  Laterality: Left;   AMPUTATION Left 10/03/2021   Procedure: AMPUTATION BELOW KNEE;  Surgeon: Renford Dills, MD;  Location: [facility];  Service: Vascular;  Laterality: Left;   AMPUTATION Right 05/24/2022   Procedure: AMPUTATION BELOW KNEE;  Surgeon: Annice Needy,  MD;  Location: [facility];  Service: General;  Laterality: Right;   AMPUTATION TOE Right 02/10/2016   Procedure: AMPUTATION TOE 3RD TOE;  Surgeon: Gwyneth Revels, DPM;  Location: [facility];  Service: Podiatry;  Laterality: Right;   AMPUTATION TOE Left 02/24/2020   Procedure: AMPUTATION TOE;  Surgeon: Rosetta Posner, DPM;  Location: [facility];  Service: Podiatry;  Laterality: Left;   APPLICATION OF WOUND VAC Left 02/29/2020   Procedure: APPLICATION OF WOUND VAC;  Surgeon: Rosetta Posner, DPM;  Location: [facility];  Service: Podiatry;  Laterality: Left;   BIOPSY  03/15/2023   Procedure: BIOPSY;  Surgeon: Wyline Mood, MD;  Location: [facility];  Service: Gastroenterology;;   BIOPSY  04/10/2023   Procedure: BIOPSY;  Surgeon: Norma Fredrickson, Boykin Nearing, MD;  Location: [facility];  Service: Gastroenterology;;   CARDIAC CATHETERIZATION     CARDIOVERSION [name] 02/13/2022  Procedure: CARDIOVERSION;  Surgeon: Lamar Blinks, MD;  Location: [facility];  Service: Cardiovascular;  Laterality: [name];   CARDIOVERSION [name] 04/23/2023   Procedure: CARDIOVERSION;  Surgeon: Antonieta Iba, MD;  Location: [facility];  Service: Cardiovascular;  Laterality: [name];   COLONOSCOPY WITH PROPOFOL [name] 11/18/2015   Procedure: COLONOSCOPY WITH PROPOFOL;  Surgeon: Scot Jun, MD;  Location: [facility]COPY;  Service: Endoscopy;  Laterality: [name];   CORONARY ARTERY BYPASS GRAFT     CORONARY STENT INTERVENTION [name] 02/02/2020   Procedure: CORONARY STENT INTERVENTION;  Surgeon: Marcina Millard, MD;  Location: [facility];  Service: Cardiovascular;  Laterality: [name];   ESOPHAGOGASTRODUODENOSCOPY (EGD) WITH PROPOFOL [name] 03/15/2023   Procedure: ESOPHAGOGASTRODUODENOSCOPY (EGD) WITH PROPOFOL;  Surgeon: Wyline Mood, MD;  Location: [facility] ENDOSCOPY;  Service: Gastroenterology;  Laterality: [name];   FLEXIBLE SIGMOIDOSCOPY [name] 04/10/2023   Procedure: FLEXIBLE SIGMOIDOSCOPY;  Surgeon: Toledo, Boykin Nearing, MD;  Location: [facility];   Service: Gastroenterology;  Laterality: [name];   GASTROSTOMY [name] 03/18/2023   Procedure: INSERTION OF GASTROSTOMY TUBE;  Surgeon: Carolan Shiver, MD;  Location: [facility];  Service: General;  Laterality: [name];   IMPACTION REMOVAL  03/15/2023   Procedure: IMPACTION REMOVAL;  Surgeon: Wyline Mood, MD;  Location: [facility] ENDOSCOPY;  Service: Gastroenterology;;   INCISION AND DRAINAGE Left 08/07/2021   Procedure: INCISION AND DRAINAGE-Partial Calcanectomy;  Surgeon: Rosetta Posner, DPM;  Location: [facility];  Service: Podiatry;  Laterality: Left;   IRRIGATION AND DEBRIDEMENT FOOT Left 02/29/2020   Procedure: IRRIGATION AND DEBRIDEMENT FOOT;  Surgeon: Rosetta Posner, DPM;  Location: [facility];  Service: Podiatry;  Laterality: Left;   IRRIGATION AND DEBRIDEMENT FOOT Left 02/24/2020   Procedure: IRRIGATION AND DEBRIDEMENT FOOT;  Surgeon: Rosetta Posner, DPM;  Location: [facility];  Service: Podiatry;  Laterality: Left;   KNEE ARTHROSCOPY     LEFT HEART CATH AND CORS/GRAFTS ANGIOGRAPHY [name] 02/02/2020   Procedure: LEFT HEART CATH AND CORS/GRAFTS ANGIOGRAPHY;  Surgeon: Dalia Heading, MD;  Location: [facility];  Service: Cardiovascular;  Laterality: [name];   LOWER EXTREMITY ANGIOGRAPHY Left 02/25/2020   Procedure: Lower Extremity Angiography;  Surgeon: Annice Needy, MD;  Location: [facility];  Service: Cardiovascular;  Laterality: Left;   LOWER EXTREMITY ANGIOGRAPHY Left 01/04/2021   Procedure: LOWER EXTREMITY ANGIOGRAPHY;  Surgeon: Annice Needy, MD;  Location: [facility];  Service: Cardiovascular;  Laterality: Left;   LOWER EXTREMITY ANGIOGRAPHY Right 05/21/2022   Procedure: Lower Extremity Angiography;  Surgeon: Annice Needy, MD;  Location: [facility];  Service: Cardiovascular;  Laterality: Right;   METATARSAL HEAD EXCISION Left 01/10/2021   Procedure: METATARSAL HEAD EXCISION - LEFT 5th;  Surgeon: Rosetta Posner, DPM;  Location: [facility];  Service: Podiatry;  Laterality:  Left;   PERIPHERAL VASCULAR CATHETERIZATION Right 01/24/2016   Procedure: Lower Extremity Angiography;  Surgeon: Renford Dills, MD;  Location: [facility];  Service: Cardiovascular;  Laterality: Right;   PERIPHERAL VASCULAR CATHETERIZATION Right 01/25/2016   Procedure: Lower Extremity Angiography;  Surgeon: Renford Dills, MD;  Location: [facility];  Service: Cardiovascular;  Laterality: Right;   PORTACATH PLACEMENT [name] 03/18/2023   Procedure: INSERTION PORT-A-CATH;  Surgeon: Carolan Shiver, MD;  Location: [facility];  Service: General;  Laterality: [name];   TOE AMPUTATION     TONSILLECTOMY       FAMILY HISTORY   Family History  Problem Relation Age of Onset   Hypertension Father    Coronary artery disease  Father    Alcohol abuse Father    Heart failure Brother      SOCIAL HISTORY   Social History   Tobacco Use   Smoking status: Former    Current packs/day: 0.00    Average packs/day: 0.5 packs/day for 45.0 years (22.5 ttl pk-yrs)    Types: Cigarettes    Start date: 04/04/1970    Quit date: 04/05/2015    Years since quitting: 8.2   Smokeless tobacco: Never  Vaping Use   Vaping status: Every Day  Substance Use Topics   Alcohol use: Not Currently    Alcohol/week: 3.0 standard drinks of alcohol    Types: 3 Glasses of wine per week   Drug use: No     MEDICATIONS   Current Medication:  Current Facility-Administered Medications:    Chlorhexidine Gluconate Cloth 2 % PADS 6 each, 6 each, Topical, Daily, Vida Rigger, MD, 6 each at 06/30/23 9147   docusate sodium (COLACE) capsule 100 mg, 100 mg, Oral, BID PRN, Vida Rigger, MD   famotidine (PEPCID) tablet 20 mg, 20 mg, Per Tube, BID, Vida Rigger, MD, 20 mg at 06/30/23 0839   fentaNYL in NS (19mcg/ml) infusion-PREMIX, 0-400 mcg/hr, Intravenous, Continuous, Karna Christmas, Rakia Frayne, MD, Last Rate: 12.5 mL/hr at 06/30/23 1000, 125 mcg/hr at 06/30/23 1000   heparin injection 5,000  Units, 5,000 Units, Subcutaneous, Q8H, Vida Rigger, MD, 5,000 Units at 06/30/23 0521   mupirocin ointment (BACTROBAN) 2 % 1 Application, 1 Application, Nasal, BID, Vida Rigger, MD, 1 Application at 06/30/23 8295   norepinephrine (LEVOPHED) 4mg  in (0.016 mg/mL) premix infusion, 2-10 mcg/min, Intravenous, Titrated, [name], RPH, Last Rate: 15 mL/hr at 06/30/23 1000, 4 mcg/min at 06/30/23 1000   Oral care mouth rinse, 15 mL, Mouth Rinse, Q2H, Oralee Rapaport, MD, 15 mL at 06/30/23 0840   Oral care mouth rinse, 15 mL, Mouth Rinse, PRN, Karna Christmas, Teriann [name], MD   polyethylene glycol (MIRALAX / GLYCOLAX) packet 17 g, 17 g, Oral, Daily PRN, Vida Rigger, MD   propofol (DIPRIVAN) 1000 MG/100ML infusion, 5-80 mcg/kg/min, Intravenous, Continuous, Jene Every, MD, Stopped at 06/29/23 1256   sodium zirconium cyclosilicate (LOKELMA) packet 10 g, 10 g, Per Tube, BID, [place], Hubbard Hartshorn, NP, 10 g at 06/30/23 0331   vecuronium (NORCURON) injection 10 mg, 10 mg, Intravenous, Once, Jene Every, MD    ALLERGIES   Penicillins, Statins, and Metformin and related    REVIEW OF SYSTEMS     Unable to obtain review of systems due to sedation mechanical ventilation  PHYSICAL EXAMINATION   Vital Signs: Temp:  [98.2 F (36.8 C)-100.4 F (38 C)] 99.5 F (37.5 C) (03/02 1000) Pulse Rate:  [58-89] 80 (03/02 1000) Resp:  [0-27] 15 (03/02 1000) BP: (73-150)/(44-137) 107/64 (03/02 1000) SpO2:  [88 %-100 %] 96 % (03/02 1000) FiO2 (%):  [50 %-80 %] 50 % (03/02 0624) Weight:  [109 kg-123.2 kg] 121.9 kg (03/02 0400)  GENERAL: Age-appropriate on mechanical ventilation sedated HEAD: Normocephalic, atraumatic.  EYES: Pupils equal, round, reactive to light.  No scleral icterus.  MOUTH: Moist mucosal membrane. NECK: Supple. No thyromegaly. No nodules. No JVD.  PULMONARY: Rhonchi bilaterally worse on the right CARDIOVASCULAR: S1 and S2. Regular rate and rhythm. No murmurs, rubs, or  gallops.  GASTROINTESTINAL: Soft, nontender, non-distended. No masses. Positive bowel sounds. No hepatosplenomegaly.  MUSCULOSKELETAL: No swelling, clubbing, or edema.  NEUROLOGIC: GCS 4T SKIN:intact,warm,dry   PERTINENT DATA     Infusions:  fentaNYL infusion INTRAVENOUS 125 mcg/hr (06/30/23 1000)  norepinephrine (LEVOPHED) Adult infusion 4 mcg/min (06/30/23 1000)   propofol (DIPRIVAN) infusion Stopped (06/29/23 1256)   Scheduled Medications:  Chlorhexidine Gluconate Cloth  6 each Topical Daily   famotidine  20 mg Per Tube BID   heparin  5,000 Units Subcutaneous Q8H   mupirocin ointment  1 Application Nasal BID   mouth rinse  15 mL Mouth Rinse Q2H   sodium zirconium cyclosilicate  10 g Per Tube BID   vecuronium  10 mg Intravenous Once   PRN Medications: docusate sodium, mouth rinse, polyethylene glycol Hemodynamic parameters:   Intake/Output: 03/01 0701 - 03/02 0700 In: 1959.1 [I.V.:339.1; NG/GT:270; IV Piggyback:1350] Out: 2635 [Urine:2435; Emesis/NG output:200]  Ventilator  Settings: Vent Mode: PRVC FiO2 (%):  [50 %-80 %] 50 % Set Rate:  [18 bmp] 18 bmp Vt Set:  [500 mL] 500 mL PEEP:  [5 cmH20-8 cmH20] 5 cmH20 Plateau Pressure:  [18 cmH20] 18 cmH20   LAB RESULTS:  Basic Metabolic Panel: Recent Labs  Lab 06/29/23 1142 06/29/23 1824 06/30/23 0008 06/30/23 0534  NA 134* 135 136 135  K 7.4* 6.4* 6.2* 6.0*  CL 97* 99 100 100  CO2 30 30 29 29   GLUCOSE 122* 115* 69* 88  BUN 83* 77* 72* 62*  CREATININE 1.39* 1.25* 1.28* 1.19  CALCIUM 9.3 8.7* 8.7* 8.8*  MG  --   --   --  2.6*  PHOS  --   --   --  3.4   Liver Function Tests: Recent Labs  Lab 06/29/23 1142  AST 15  ALT 17  ALKPHOS 122  BILITOT 0.4  PROT 6.8  ALBUMIN 2.8*   Recent Labs  Lab 06/29/23 1142  LIPASE 22   No results for input(s): "AMMONIA" in the last 168 hours. CBC: Recent Labs  Lab 06/24/23 1022 06/29/23 1142 06/30/23 0534  WBC 8.9 7.4 6.3  HGB 10.9* 9.7* 9.0*  HCT 36.9*  31.2* 28.2*  MCV 102.5* 98.4 96.2  PLT 181 203 195   Cardiac Enzymes: No results for input(s): "CKTOTAL", "CKMB", "CKMBINDEX", "TROPONINI" in the last 168 hours. BNP: Invalid input(s): "POCBNP" CBG: Recent Labs  Lab 06/30/23 0031 06/30/23 0057 06/30/23 0325 06/30/23 0533 06/30/23 0753  GLUCAP 66* 100* 73 88 84       IMAGING RESULTS:     Cardiomegaly and right lower lobe infiltrate with atelectasis  ASSESSMENT AND PLAN    -Multidisciplinary rounds held today  Acute Hypoxic Respiratory Failure -continue Full MV support -continue Bronchodilator Therapy -Wean Fio2 and PEEP as tolerated -will perform SAT/SBT when respiratory parameters are met -Due to report of complications with PEG tube aspiration pneumonia is on the differential we will empirically treat with IV antibiotics -Tracheal aspirate -MRSA PCR -Procalcitonin trend -RVP  Acute on chronic diastolic CHF -Diurese when able, currently with hypotension -Lasix as tolerated -follow up cardiac enzymes as indicated ICU monitoring  Acute renal Failure stage II KDIGO-most likely due to ATN -follow chem 7 -follow UO -continue Foley Catheter-assess need daily   PEG tube status -Wife reports malfunctioning PEG tube with discolored foul-smelling drainage around insertion site -Patient has completed course of doxycycline and outpatient -Low intermittent suction for decompression n.p.o. for now   Esophageal cancer -Chronic condition status post 20 treatments of radiation   ID -continue IV abx as prescibed-Zithromax and Rocephin -follow up cultures-blood cultures and tracheal aspirate ordered -Urinalysis and urine culture due to recent history of UTI, current urinalysis appears hazy with trace leukocyte esterase and no bacteria seen  GI/Nutrition GI PROPHYLAXIS  as indicated DIET-->TF's as tolerated Constipation protocol as indicated  ENDO - ICU hypoglycemic\Hyperglycemia protocol -check FSBS per  protocol   ELECTROLYTES -follow labs as needed -replace as needed -pharmacy consultation   DVT/GI PRX ordered -SCDs  TRANSFUSIONS AS NEEDED MONITOR FSBS ASSESS the need for LABS as needed    Critical care provider statement:   Total critical care time: 32  minutes   Performed by: Karna Christmas MD   Critical care time was exclusive of separately billable procedures and treating other patients.   Critical care was necessary to treat or prevent imminent or life-threatening deterioration.   Critical care was time spent personally by me on the following activities: development of treatment plan with patient and/or surrogate as well as nursing, discussions with consultants, evaluation of patient's response to treatment, examination of patient, obtaining history from patient or surrogate, ordering and performing treatments and interventions, ordering and review of laboratory studies, ordering and review of radiographic studies, pulse oximetry and re-evaluation of patient's condition.    Vida Rigger, M.D.  Pulmonary & Critical Care Medicine

## 2023-06-30 NOTE — Plan of Care (Signed)
  Problem: Clinical Measurements: Goal: Ability to maintain clinical measurements within normal limits will improve Outcome: Progressing Goal: Will remain free from infection Outcome: Progressing Goal: Respiratory complications will improve Outcome: Progressing   Problem: Pain Managment: Goal: General experience of comfort will improve and/or be controlled Outcome: Progressing   Problem: Safety: Goal: Ability to remain free from injury will improve Outcome: Progressing

## 2023-06-30 NOTE — Progress Notes (Signed)
 0120 - Potassium 6.2. Provider notified.   32 - Orders received for Lokelma, Insulin, and D50. Awaiting delivery of Insulin from pharmacy.

## 2023-06-30 NOTE — Consult Note (Addendum)
 PHARMACY CONSULT NOTE - ELECTROLYTES  Pharmacy Consult for Electrolyte Monitoring and Replacement   Recent Labs: Height: 5\' 5"  (165.1 cm) Weight: 121.9 kg (268 lb 11.9 oz) IBW/kg (Calculated) : 61.5 Estimated Creatinine Clearance: 72 mL/min (by C-G formula based on SCr of 1.19 mg/dL). Potassium (mmol/L)  Date Value  06/30/2023 6.0 (H)  07/12/2011 4.4   Magnesium (mg/dL)  Date Value  16/01/9603 2.6 (H)   Calcium (mg/dL)  Date Value  54/12/8117 8.8 (L)   Calcium, Total (mg/dL)  Date Value  14/78/2956 8.8   Albumin (g/dL)  Date Value  21/30/8657 2.8 (L)   Phosphorus (mg/dL)  Date Value  84/69/6295 3.4   Sodium (mmol/L)  Date Value  06/30/2023 135  07/12/2011 141   Assessment  Alec A Fenech Montez Hageman. is a 68 y.o. male presenting with unresponsive and resp failure. PMH significant for CHF, COPD, CAD, diabetes, PVD, bilateral below the knee amputations . Pharmacy has been consulted to monitor and replace electrolytes.  Diet: npo MIVF:propofol and fentanyl drip Pertinent medications: n/a  Goal of Therapy: Electrolytes WNL  Plan:  K remains elevated NP ordered D50 + 10 units IV aspart but repeat level unchanged: D50 + 10 units IV aspart to be repeated  Repeat BMP after above order given  sodium zirconium cyclosilicate 10 grams per tube BID Check BMP, Mg, Phos with AM labs.  Thank you for allowing pharmacy to be a part of this patient's care.  Burnis Medin, PharmD, BCPS 06/30/2023 7:23 AM

## 2023-06-30 NOTE — Plan of Care (Signed)

## 2023-06-30 NOTE — Progress Notes (Signed)
 PHARMACY - ANTICOAGULATION CONSULT NOTE  Pharmacy Consult for heparin infusion Indication: atrial fibrillation  Allergies  Allergen Reactions   Penicillins Other (See Comments)    Happened at 68 years old and pt. stated he passed out  He has tolerated amoxicillin/clavulanate and ampicillin/sulbactam   Statins     Other reaction(s): Muscle Pain Causes legs to ache per pt   Metformin And Related Diarrhea    Patient Measurements: Height: 5\' 5"  (165.1 cm) Weight: 121.9 kg (268 lb 11.9 oz) IBW/kg (Calculated) : 61.5 Heparin Dosing Weight: 90.8 kg  Vital Signs: Temp: 99.5 F (37.5 C) (03/02 2007) BP: 109/63 (03/02 2007) Pulse Rate: 57 (03/02 2007)  Labs: Recent Labs    06/29/23 1142 06/29/23 1240 06/29/23 1824 06/30/23 0534 06/30/23 1147 06/30/23 1632 06/30/23 1948  HGB 9.7*  --   --  9.0*  --   --   --   HCT 31.2*  --   --  28.2*  --   --   --   PLT 203  --   --  195  --   --   --   APTT  --  40*  --   --   --   --  77*  LABPROT  --  16.2*  --   --   --   --   --   INR  --  1.3*  --   --   --   --   --   CREATININE 1.39*  --    < > 1.19 1.22 1.21  --   TROPONINIHS 11  --   --   --   --   --   --    < > = values in this interval not displayed.    Estimated Creatinine Clearance: 70.8 mL/min (by C-G formula based on SCr of 1.21 mg/dL).   Medical History: Past Medical History:  Diagnosis Date   Arrhythmia    atrial fibrillation   Asthma    CHF (congestive heart failure) (HCC)    COPD (chronic obstructive pulmonary disease) (HCC)    Coronary artery disease    Depression    Diabetes mellitus without complication (HCC)    Gout    History anabolic steroid use    Hyperlipidemia    Hypertension    Hypogonadism in male    MI (myocardial infarction) (HCC)    Morbid obesity (HCC)    Myocardial infarction (HCC)    Peripheral vascular disease (HCC)    Perirectal abscess    Pleurisy    Sleep apnea    CPAP at night, no oxygen   Varicella     Medications:   Scheduled:   Chlorhexidine Gluconate Cloth  6 each Topical Daily   famotidine  20 mg Per Tube BID   [START ON 07/01/2023] levothyroxine  25 mcg Per Tube Q0600   mupirocin ointment  1 Application Nasal BID   mouth rinse  15 mL Mouth Rinse Q2H   sodium zirconium cyclosilicate  10 g Per Tube BID   vecuronium  10 mg Intravenous Once    Assessment: 68 y.o. male presenting with unresponsive and resp failure. PMH significant for CHF, COPD, CAD, diabetes, PVD, bilateral below the knee amputations. He takes apixaban PTA with last dose 06/28/23 pm  Date Time aPTT/HL Rate/Comment  3/2 1948 aPTT 77 Therapeutic x1  Goal of Therapy:  anti-Xa level  0.3-0.7 units/ml aPTT 66 - 102 seconds Monitor platelets by anticoagulation protocol: Yes   Plan:  Continue  heparin infusion at 1500 units/hr Check confirmatory aPTT in 6 hours and at least once daily while on heparin: we will use aPTT to guide therapy until aPTT and anti-Xa level correlate Continue to monitor H&H and platelets  Thank you for involving pharmacy in this patient's care.   Rockwell Alexandria, PharmD Clinical Pharmacist 06/30/2023 9:08 PM

## 2023-06-30 NOTE — TOC Progression Note (Signed)
 Transition of Care (TOC) - Progression Note    Patient Details  Name: [name]. MRN: [id number] Date of Birth: 07-12-1955  [facility]) CM/SW Contact  Liliana Cline, LCSW Phone Number: 06/30/2023, 9:24 AM  Clinical Narrative:    Patient is open with [facility] PT and RN.   Expected Discharge Plan: Home w Home Health Services Barriers to Discharge: Continued Medical Work up  Expected Discharge Plan and Services       Living arrangements for the past 2 months: Single Family Home                                       Social Determinants of Health (SDOH) Interventions SDOH Screenings   Food Insecurity: No Food Insecurity (06/29/2023)  Housing: Low Risk  (06/29/2023)  Transportation Needs: No Transportation Needs (06/29/2023)  Utilities: Not At Risk (06/29/2023)  Depression (PHQ2-9): Low Risk  (11/21/2021)  Financial Resource Strain: Low Risk  (09/28/2022)   Received from [facility]  Social Connections: Socially Isolated (06/29/2023)  Tobacco Use: Medium Risk (06/29/2023)    Readmission Risk Interventions    02/13/2022    4:57 PM 09/29/2021   11:12 AM  Readmission Risk Prevention Plan  Transportation Screening Complete Complete  PCP or Specialist Appt within 3-5 Days  Complete  HRI or Home Care Consult  Complete  Social Work Consult for Recovery Care Planning/Counseling  Complete  Palliative Care Screening  Not Applicable  Medication Review Oceanographer) Complete Complete  PCP or Specialist appointment within 3-5 days of discharge Complete   HRI or Home Care Consult Complete   SW Recovery Care/Counseling Consult Complete   Palliative Care Screening Not Applicable   Skilled Nursing Facility Not Applicable

## 2023-06-30 NOTE — Consult Note (Signed)
 PHARMACY CONSULT NOTE - ELECTROLYTES  Pharmacy Consult for Electrolyte Monitoring and Replacement   Recent Labs: Height: 5\' 5"  (165.1 cm) Weight: 121.9 kg (268 lb 11.9 oz) IBW/kg (Calculated) : 61.5 Estimated Creatinine Clearance: 70.8 mL/min (by C-G formula based on SCr of 1.21 mg/dL). Potassium (mmol/L)  Date Value  06/30/2023 5.8 (H)  07/12/2011 4.4   Magnesium (mg/dL)  Date Value  86/57/8469 2.6 (H)   Calcium (mg/dL)  Date Value  62/95/2841 8.7 (L)   Calcium, Total (mg/dL)  Date Value  32/44/0102 8.8   Albumin (g/dL)  Date Value  72/53/6644 2.8 (L)   Phosphorus (mg/dL)  Date Value  03/47/4259 3.4   Sodium (mmol/L)  Date Value  06/30/2023 136  07/12/2011 141   Assessment  [name]. is a 68 y.o. male presenting with unresponsive and resp failure. PMH significant for CHF, COPD, CAD, diabetes, PVD, bilateral below the knee amputations . Pharmacy has been consulted to monitor and replace electrolytes.  Diet: npo MIVF:propofol and fentanyl drip Pertinent medications: n/a  Goal of Therapy: Electrolytes WNL  Plan:  K remains elevated s/p D50/IV aspart x3 today - per NP, will repeat D50 + IV insulin aspart 10 units x1 Repeat BMP after above order given  sodium zirconium cyclosilicate 10 grams per tube BID Check BMP, Mg, Phos with AM labs.  Thank you for involving pharmacy in this patient's care.   Rockwell Alexandria, PharmD Clinical Pharmacist 06/30/2023 6:39 PM

## 2023-07-01 ENCOUNTER — Inpatient Hospital Stay: Payer: Medicare Other | Attending: Internal Medicine

## 2023-07-01 ENCOUNTER — Encounter: Payer: Self-pay | Admitting: Internal Medicine

## 2023-07-01 ENCOUNTER — Ambulatory Visit: Payer: Medicare Other

## 2023-07-01 ENCOUNTER — Inpatient Hospital Stay

## 2023-07-01 ENCOUNTER — Ambulatory Visit

## 2023-07-01 DIAGNOSIS — J9601 Acute respiratory failure with hypoxia: Secondary | ICD-10-CM

## 2023-07-01 DIAGNOSIS — I5033 Acute on chronic diastolic (congestive) heart failure: Secondary | ICD-10-CM

## 2023-07-01 DIAGNOSIS — Z7901 Long term (current) use of anticoagulants: Secondary | ICD-10-CM | POA: Insufficient documentation

## 2023-07-01 DIAGNOSIS — I4891 Unspecified atrial fibrillation: Secondary | ICD-10-CM | POA: Insufficient documentation

## 2023-07-01 DIAGNOSIS — N179 Acute kidney failure, unspecified: Secondary | ICD-10-CM | POA: Diagnosis not present

## 2023-07-01 DIAGNOSIS — E1151 Type 2 diabetes mellitus with diabetic peripheral angiopathy without gangrene: Secondary | ICD-10-CM | POA: Insufficient documentation

## 2023-07-01 DIAGNOSIS — I2510 Atherosclerotic heart disease of native coronary artery without angina pectoris: Secondary | ICD-10-CM | POA: Insufficient documentation

## 2023-07-01 DIAGNOSIS — R197 Diarrhea, unspecified: Secondary | ICD-10-CM | POA: Insufficient documentation

## 2023-07-01 DIAGNOSIS — Z8619 Personal history of other infectious and parasitic diseases: Secondary | ICD-10-CM | POA: Insufficient documentation

## 2023-07-01 DIAGNOSIS — K5900 Constipation, unspecified: Secondary | ICD-10-CM | POA: Insufficient documentation

## 2023-07-01 DIAGNOSIS — C155 Malignant neoplasm of lower third of esophagus: Secondary | ICD-10-CM | POA: Insufficient documentation

## 2023-07-01 DIAGNOSIS — Z89512 Acquired absence of left leg below knee: Secondary | ICD-10-CM | POA: Insufficient documentation

## 2023-07-01 DIAGNOSIS — F17290 Nicotine dependence, other tobacco product, uncomplicated: Secondary | ICD-10-CM | POA: Insufficient documentation

## 2023-07-01 LAB — CBC
HCT: 29.3 % — ABNORMAL LOW (ref 39.0–52.0)
Hemoglobin: 9.1 g/dL — ABNORMAL LOW (ref 13.0–17.0)
MCH: 30.8 pg (ref 26.0–34.0)
MCHC: 31.1 g/dL (ref 30.0–36.0)
MCV: 99.3 fL (ref 80.0–100.0)
Platelets: 188 10*3/uL (ref 150–400)
RBC: 2.95 MIL/uL — ABNORMAL LOW (ref 4.22–5.81)
RDW: 18.3 % — ABNORMAL HIGH (ref 11.5–15.5)
WBC: 7.8 10*3/uL (ref 4.0–10.5)
nRBC: 0 % (ref 0.0–0.2)

## 2023-07-01 LAB — GLUCOSE, CAPILLARY
Glucose-Capillary: 117 mg/dL — ABNORMAL HIGH (ref 70–99)
Glucose-Capillary: 128 mg/dL — ABNORMAL HIGH (ref 70–99)
Glucose-Capillary: 145 mg/dL — ABNORMAL HIGH (ref 70–99)
Glucose-Capillary: 79 mg/dL (ref 70–99)
Glucose-Capillary: 85 mg/dL (ref 70–99)
Glucose-Capillary: 99 mg/dL (ref 70–99)

## 2023-07-01 LAB — BASIC METABOLIC PANEL
Anion gap: 7 (ref 5–15)
BUN: 47 mg/dL — ABNORMAL HIGH (ref 8–23)
CO2: 28 mmol/L (ref 22–32)
Calcium: 8.8 mg/dL — ABNORMAL LOW (ref 8.9–10.3)
Chloride: 100 mmol/L (ref 98–111)
Creatinine, Ser: 1.11 mg/dL (ref 0.61–1.24)
GFR, Estimated: >60 mL/min (ref >60)
Glucose, Bld: 100 mg/dL — ABNORMAL HIGH (ref 70–99)
Potassium: 5.6 mmol/L — ABNORMAL HIGH (ref 3.5–5.1)
Sodium: 135 mmol/L (ref 135–145)

## 2023-07-01 LAB — BLOOD GAS, ARTERIAL
Acid-Base Excess: 9.6 mmol/L — ABNORMAL HIGH (ref 0.0–2.0)
Bicarbonate: 35.8 mmol/L — ABNORMAL HIGH (ref 20.0–28.0)
FIO2: 80 %
MECHVT: 500 mL
Mechanical Rate: 18
O2 Saturation: 96 %
PEEP: 8 cmH2O
Patient temperature: 37
pCO2 arterial: 54 mmHg — ABNORMAL HIGH (ref 32–48)
pH, Arterial: 7.43 (ref 7.35–7.45)
pO2, Arterial: 70 mmHg — ABNORMAL LOW (ref 83–108)

## 2023-07-01 LAB — APTT: aPTT: 86 s — ABNORMAL HIGH (ref 24–36)

## 2023-07-01 LAB — MAGNESIUM
Magnesium: 2.2 mg/dL (ref 1.7–2.4)
Magnesium: 2 mg/dL (ref 1.7–2.4)

## 2023-07-01 LAB — HEPARIN LEVEL (UNFRACTIONATED): Heparin Unfractionated: 0.55 [IU]/mL (ref 0.30–0.70)

## 2023-07-01 LAB — PROCALCITONIN: Procalcitonin: 0.27 ng/mL

## 2023-07-01 LAB — PHOSPHORUS: Phosphorus: 4.1 mg/dL (ref 2.5–4.6)

## 2023-07-01 MED ORDER — PIPERACILLIN-TAZOBACTAM 3.375 G IVPB
3.3750 g | Freq: Three times a day (TID) | INTRAVENOUS | Status: AC
  Administered 2023-07-01 – 2023-07-08 (×21): 3.375 g via INTRAVENOUS
  Filled 2023-07-01 (×21): qty 50

## 2023-07-01 MED ORDER — VANCOMYCIN HCL 2000 MG/400ML IV SOLN
2000.0000 mg | Freq: Once | INTRAVENOUS | Status: AC
  Administered 2023-07-01: 2000 mg via INTRAVENOUS
  Filled 2023-07-01: qty 400

## 2023-07-01 MED ORDER — SODIUM CHLORIDE 0.9% FLUSH: 10.0000 mL | INTRAVENOUS | Status: DC | PRN

## 2023-07-01 MED ORDER — VITAL HIGH PROTEIN PO LIQD
1000.0000 mL | ORAL | Status: DC
  Administered 2023-07-01: 1000 mL

## 2023-07-01 MED ORDER — PROSOURCE TF20 ENFIT COMPATIBL EN LIQD
60.0000 mL | Freq: Two times a day (BID) | ENTERAL | Status: DC
  Administered 2023-07-01 – 2023-07-02 (×2): 60 mL
  Filled 2023-07-01 (×3): qty 60

## 2023-07-01 MED ORDER — FREE WATER
30.0000 mL | Status: AC
  Administered 2023-07-01 – 2023-07-05 (×25): 30 mL

## 2023-07-01 MED ORDER — VANCOMYCIN HCL 1500 MG/300ML IV SOLN
1500.0000 mg | INTRAVENOUS | Status: DC
  Administered 2023-07-02: 1500 mg via INTRAVENOUS
  Filled 2023-07-01 (×2): qty 300

## 2023-07-01 NOTE — Significant Event (Signed)
 Pt began to desaturate into the 70s. 100% o2 breath given with no change. Pt bagged, NP to bedside. Suctioned for thick secretions.. Pt then went back to 90s. Chest xray done.

## 2023-07-01 NOTE — Plan of Care (Cosign Needed)
  Problem: Clinical Measurements: Goal: Will remain free from infection Outcome: Progressing   Problem: Activity: Goal: Risk for activity intolerance will decrease Outcome: Progressing   Problem: Nutrition: Goal: Adequate nutrition will be maintained Outcome: Progressing   Problem: Coping: Goal: Level of anxiety will decrease Outcome: Progressing   Problem: Elimination: Goal: Will not experience complications related to urinary retention Outcome: Progressing   Problem: Pain Managment: Goal: General experience of comfort will improve and/or be controlled Outcome: Progressing   Problem: Safety: Goal: Ability to remain free from injury will improve Outcome: Progressing   Problem: Skin Integrity: Goal: Risk for impaired skin integrity will decrease Outcome: Progressing

## 2023-07-01 NOTE — Consult Note (Addendum)
 Pharmacy Antibiotic Note  [name]. is a 68 y.o. male admitted on 06/29/2023 with pneumonia. Patient presented after experiencing epigastric discomfort and had his PEG tube evaluated due to oozing of discolored brown foul-smelling discharge. This was treated with a round of doxycyline (10 days). They presented to the ED after developing weakness and respiratory distress. They were intubated after their hypoxia became progressively worse. Pharmacy has been consulted for Vancomycin and Zosyn dosing.  Today, 07/01/2023 Scr 1.11 (baseline)  WBC 7.8  Afebrile last 24 hours  Bilateral above-knee amputee on wheelchair at baseline  3/1 Imaging:  Heterogeneous airspace opacity and consolidation of the right lung base, consistent with infection or aspiration.  Plan: Day 1 of antibiotics (previously given ceftriaxone and zithromax x 1 on 3/1) Give vancomycin 2000 mg IV x1  Followed by vancomycin 1500 mg IV Q24H. Goal AUC 400-550. Expected AUC: 490 Expected Css min: 11.3 SCr used: 1.11  Weight used: IBW, Vd used: 0.5 (BMI 44) Patient also started on Zosyn 3.375 g Q8H  Continue to monitor renal function and follow culture results  Height: 5\' 5"  (165.1 cm) Weight: 121.5 kg (267 lb 13.7 oz) IBW/kg (Calculated) : 61.5  Temp (24hrs), Avg:99.3 F (37.4 C), Min:99.1 F (37.3 C), Max:99.5 F (37.5 C)  Recent Labs  Lab 06/29/23 1142 06/29/23 1411 06/29/23 1606 06/29/23 1824 06/30/23 0008 06/30/23 0534 06/30/23 1147 06/30/23 1632 06/30/23 2247 07/01/23 0222  WBC 7.4  --   --   --   --  6.3  --   --   --  7.8  CREATININE 1.39*  --   --  1.25*   < > 1.19 1.22 1.21 1.15 1.11  LATICACIDVEN 1.0 1.2 1.1 0.9  --   --   --   --   --   --    < > = values in this interval not displayed.    Estimated Creatinine Clearance: 77 mL/min (by C-G formula based on SCr of 1.11 mg/dL).    Allergies  Allergen Reactions   Penicillins Other (See Comments)    Happened at 68 years old and pt. stated he  passed out  He has tolerated amoxicillin/clavulanate and ampicillin/sulbactam   Statins     Other reaction(s): Muscle Pain Causes legs to ache per pt   Metformin And Related Diarrhea    Antimicrobials this admission: 3/1 Zithromax x 1 3/1 Ceftriaxone x 1 3/3 Zosyn >> 3/3 Vancomycin >>   Dose adjustments this admission: NA  Microbiology results: 3/1 BCx: NGTD 3/1 Respiratory panel: Rhinovirus  3/1 MRSA PCR: positive  Thank you for allowing pharmacy to be a part of this patient's care.  Effie Shy, PharmD Pharmacy Resident  07/01/2023 12:20 PM

## 2023-07-01 NOTE — Progress Notes (Addendum)
 Initial Nutrition Assessment  DOCUMENTATION CODES:   Morbid obesity  INTERVENTION:   -TF via g-tube:   Initiate Vital High Protein @ 20 ml/hr   When medically able, increase by 10 ml every 4 hours to goal rate of 65 ml/hr.   60 ml Prosource TF BID  30 ml free water flush every 4 hours  Tube feeding regimen provides 1720 kcal (100% of needs), 176 grams of protein, and 1304 ml of H2O.  Total free water: 1484 ml daily  NUTRITION DIAGNOSIS:   Inadequate oral intake related to inability to eat as evidenced by NPO status.  GOAL:   Patient will meet greater than or equal to 90% of their needs  MONITOR:   TF tolerance, Vent status  REASON FOR ASSESSMENT:   Ventilator    ASSESSMENT:   Pt with a history of cardiac dysfunction with atrial fibrillation coronary artery disease status post CABG and CHF, esophageal cancer status post 20 rounds of chemotherapy with 5 additional rounds planned, PEG tube status, diabetes, gout, dyslipidemia morbid obesity OSA on CPAP, bilateral above-knee amputee on wheelchair at baseline. FOr a few days PTA, patient had been feeling epigastric discomfort and had his PEG tube evaluated by nurse with oozing of discolored brown foul-smelling discharge around PEG tube site. This was evaluated further by GI provider who explained this may not need additional treatment.  PCP prescribed antimicrobial round of doxycycline.  The following day patient progressively became more weak and had mechanical fall without outstretched hand falling on his daily and wife found him face down in living room.  He had respiratory distress in the field was noted to be hypoxemic and required bagging.  Pt admitted with acute hypoxic respiratory failure secondary to pulmonary infiltrate on rt lung.   Patient is currently intubated on ventilator support KUB on 06/29/23 reveals enteric tube is just beyond GE junction.  MV: 10.7 L/min Temp (24hrs), Avg:99.4 F (37.4 C), Min:99.1 F (37.3  C), Max:99.7 F (37.6 C)  Propofol: 6.5 ml/hr (provides 172 kcals daily)   Reviewed I/O's: -1.1 L x 24 hours and -1.8 L since admission  UOP: 2.4 L x 24 hours  OGT output: 0 ml x 24 hours  Pt with esophageal cancer, currently receiving radiation treatments at cancer center.    Pt familiar to this RD due to multiple prior admissions. Pt recently discharged from hospital on 05/03/23 to [facility]. Case discussed with RD at [facility], who has been following pt recently. Pt remains on home TF regimen of 1 carton of Glucerna 1.5 6 times daily. Pt also receives flushes of 70 ml before and after each feeding, 30 ml flush with med passes and 6-7 ounces of water BID if unable to drink orally. Per note on 05/30/23, pt was cleared by radiation oncologist for clear liquids as tolerated. Pt consumes liquids such as egg drop soup at home and wife inquired about low sugar oral nutrition supplement shake to help maximize nutrition. Pt receive TF supplies through [employer].   G-tube clamped.   Case discussed with RN and MD. Per RN, pt still with a lot of secretions through OGT. RN also concerned about PEG malfunction. Per MD, would like to trial trickle trickle feedings today.   Per RN, plan for wake up assessment tomorrow and possible extubation.   Noted wt gain since last admission.   Per prior history, pt ht is 6'4". Used this to help assess nutritional needs.   Medications reviewed and include lokelma  and levophed.   ADDENDUM: Receives secure chat from RN after TF has been ordered. Pt wife is questioning if TF formula is DM specific. Communicated with RN that this formula has less carbohydrate than home TF of Glucerna.  Labs reviewed: K: 5.6, CBGS: 82-99 (inpatient orders for glycemic control are none).    NUTRITION - FOCUSED PHYSICAL EXAM:  Flowsheet Row Most Recent Value  Orbital Region No depletion  Upper Arm Region Mild depletion  Thoracic and Lumbar Region No depletion   Buccal Region No depletion  Temple Region No depletion  Clavicle Bone Region Mild depletion  Clavicle and Acromion Bone Region Mild depletion  Scapular Bone Region Mild depletion  Dorsal Hand No depletion  Patellar Region No depletion  Anterior Thigh Region No depletion  Posterior Calf Region Unable to assess  Edema (RD Assessment) Mild  Hair Reviewed  Eyes Reviewed  Mouth Reviewed  Skin Reviewed  Nails Reviewed       Diet Order:   Diet Order             Diet NPO time specified  Diet effective now                   EDUCATION NEEDS:   Not appropriate for education at this time  Skin:  Skin Assessment: Reviewed RN Assessment  Last BM:  Unknown  Height:   Ht Readings from Last 1 Encounters:  06/29/23 5\' 5"  (1.651 m)    Weight:   Wt Readings from Last 1 Encounters:  07/01/23 121.5 kg    Ideal Body Weight:  75.2 kg (adjusted for bilateral AKA)  BMI:  Body mass index is 44.57 kg/m.  Estimated Nutritional Needs:   Kcal:  1610-9604  Protein:  150-190 grams  Fluid:  1.7-1.9 L    Levada Schilling, RD, LDN, CDCES Registered Dietitian III Certified Diabetes Care and Education Specialist If unable to reach this RD, please use "RD Inpatient" group chat on secure chat between hours of 8am-4 pm daily

## 2023-07-01 NOTE — Progress Notes (Signed)
 NAME:  [name]., MRN:  [id number], DOB:  [birth year], LOS: 2 ADMISSION DATE:  06/29/2023, CHIEF COMPLAINT:  Respiratory Failure   History of Present Illness:   68 year old male with a history of CAD, Afib, CHF, esophageal cancer (s/p chemoradiation), PEG tube, DM, obesity, OSA and bilateral above knee amputations who presented to the hospital on 3/1 with respiratory failure, requiring intubation on arrival. He was recently discharged from the hospital on 2/16 after brief admission with chest discomfort.  Given presentation with respiratory distress, the patient was intubated by the ED provider on arrival. He was very hypoxic and unresponsive per family and EMS. He was found by wife after likely fall, with EMS needing to bag him secondary to hypoxia.  Blood work on presentation to the ED was notable for AKI, hyperkalemia, elevated BNP, and rhinovirus on his respiratory viral panel. He is admitted to the ICU for management of respiratory failure.  Pertinent  Medical History   -CAD -CHF -COPD -DM -Gout -Obesity -PAD -OSA  Significant Hospital Events: Including procedures, antibiotic start and stop dates in addition to other pertinent events   06/30/23 - intubated ventilated 07/01/23 - did not pass SBT, wife updated at bedside  Interim History / Subjective:  Remains intubated, arouses with lightening of sedation  Objective   Blood pressure 120/75, pulse (!) 105, temperature 99.7 F (37.6 C), resp. rate 18, height 5\' 5"  (1.651 m), weight 121.5 kg, SpO2 93%.    Vent Mode: PRVC FiO2 (%):  [40 %-50 %] 50 % Set Rate:  [18 bmp] 18 bmp Vt Set:  [500 mL] 500 mL PEEP:  [5 cmH20] 5 cmH20 Plateau Pressure:  [14 cmH20-18 cmH20] 18 cmH20   Intake/Output Summary (Last 24 hours) at 07/01/2023 1424 Last data filed at 07/01/2023 1304 Gross per 24 hour  Intake 1321.81 ml  Output 1960 ml  Net -638.19 ml   Filed Weights   06/29/23 1730 06/30/23 0400 07/01/23 0429  Weight: 123.2 kg 121.9  kg 121.5 kg    Examination: Physical Exam Constitutional:      General: He is not in acute distress.    Appearance: He is ill-appearing.  Cardiovascular:     Rate and Rhythm: Normal rate and regular rhythm.  Pulmonary:     Comments: Ventilated breath sounds bilaterally Abdominal:     General: There is distension.     Palpations: Abdomen is soft.  Neurological:     Mental Status: He is disoriented.      Assessment & Plan:   #Acute Hypoxic Respiratory Failure #Acute on Chronic HFpEF #Afib #AKI #History of Esophageal Cancer on radiation therapy  Neuro - on fentanyl and propofol for analgosedation, with goal RASS of -1.  CV - elevated BNP on presentation, hypotensive after initiation of analgosedation and currently on very low dose norepinephrine for vasopressor support. Will hold off on aggressive diuresis for now, and re-consider tomorrow (especially given I/O overall -ve) Pulm - respiratory failure in the setting of rhinovirus infection, with possible superimposed bacterial infection. Other considerations on the differential include pulmonary edema as well as radiation pneumonitis (with active radiation therapy to esophageal tumor). Continued on ventilatory support with daily SBT (did not tolerate today). GI - will re-initiate tube feeds at trickle and increase rate slowly monitoring for any discharge from around the insertion site, famotidine for SUP Renal - kidney function improving, monitor, avoid nephrotoxins Endo - resume home levothyroxine, ICU glycemic protocol Hem/Onc - heparin gtt for afib, history of esophageal cancer s/p chemoradiation  ID - will broaden antibiotics for management of possible aspiration pneumonia. Broaden to Vanc/Zosyn given positive MRSA screen.  Best Practice (right click and "Reselect all SmartList Selections" daily)   Diet/type: tubefeeds DVT prophylaxis systemic heparin Pressure ulcer(s): present on admission  GI prophylaxis: H2B Lines:  Central line and yes and it is still needed Foley:  Yes, and it is still needed Code Status:  full code Last date of multidisciplinary goals of care discussion [07/01/2023]  Labs   CBC: Recent Labs  Lab 06/29/23 1142 06/30/23 0534 07/01/23 0222  WBC 7.4 6.3 7.8  HGB 9.7* 9.0* 9.1*  HCT 31.2* 28.2* 29.3*  MCV 98.4 96.2 99.3  PLT 203 195 188    Basic Metabolic Panel: Recent Labs  Lab 06/30/23 0534 06/30/23 1147 06/30/23 1632 06/30/23 2247 07/01/23 0222  NA 135 137 136 135 135  K 6.0* 6.0* 5.8* 5.6* 5.6*  CL 100 102 102 102 100  CO2 29 28 27 27 28   GLUCOSE 88 84 114* 87 100*  BUN 62* 60* 54* 50* 47*  CREATININE 1.19 1.22 1.21 1.15 1.11  CALCIUM 8.8* 8.9 8.7* 8.7* 8.8*  MG 2.6*  --   --   --  2.2  PHOS 3.4  --   --   --   --    GFR: Estimated Creatinine Clearance: 77 mL/min (by C-G formula based on SCr of 1.11 mg/dL). Recent Labs  Lab 06/29/23 1142 06/29/23 1411 06/29/23 1606 06/29/23 1824 06/30/23 0534 07/01/23 0222  PROCALCITON  --   --   --   --  0.33 0.27  WBC 7.4  --   --   --  6.3 7.8  LATICACIDVEN 1.0 1.2 1.1 0.9  --   --     Liver Function Tests: Recent Labs  Lab 06/29/23 1142  AST 15  ALT 17  ALKPHOS 122  BILITOT 0.4  PROT 6.8  ALBUMIN 2.8*   Recent Labs  Lab 06/29/23 1142  LIPASE 22   No results for input(s): "AMMONIA" in the last 168 hours.  ABG    Component Value Date/Time   PHART 7.43 06/29/2023 1257   PCO2ART 54 (H) 06/29/2023 1257   PO2ART 70 (L) 06/29/2023 1257   HCO3 35.8 (H) 06/29/2023 1257   ACIDBASEDEF 0.8 03/15/2022 1512   O2SAT 96 06/29/2023 1257     Coagulation Profile: Recent Labs  Lab 06/29/23 1240  INR 1.3*    Cardiac Enzymes: No results for input(s): "CKTOTAL", "CKMB", "CKMBINDEX", "TROPONINI" in the last 168 hours.  HbA1C: Hgb A1c MFr Bld  Date/Time Value Ref Range Status  03/15/2023 04:02 AM 7.8 (H) 4.8 - 5.6 % Final    Comment:    (NOTE) Pre diabetes:          5.7%-6.4%  Diabetes:               >6.4%  Glycemic control for   <7.0% adults with diabetes   05/22/2022 04:40 AM 11.2 (H) 4.8 - 5.6 % Final    Comment:    (NOTE) Pre diabetes:          5.7%-6.4%  Diabetes:              >6.4%  Glycemic control for   <7.0% adults with diabetes     CBG: Recent Labs  Lab 06/30/23 1931 06/30/23 2347 07/01/23 0342 07/01/23 0721 07/01/23 1128  GLUCAP 91 82 79 85 99    Review of Systems:   [name]  Past Medical History:  He,  has a past medical history of Arrhythmia, Asthma, CHF (congestive heart failure) (HCC), COPD (chronic obstructive pulmonary disease) (HCC), Coronary artery disease, Depression, Diabetes mellitus without complication (HCC), Gout, History anabolic steroid use, Hyperlipidemia, Hypertension, Hypogonadism in male, MI (myocardial infarction) (HCC), Morbid obesity (HCC), Myocardial infarction [employer]), Peripheral vascular disease (HCC), Perirectal abscess, Pleurisy, Sleep apnea, and Varicella.   Surgical History:   Past Surgical History:  Procedure Laterality Date   ABDOMINAL AORTIC ANEURYSM REPAIR     ACHILLES TENDON SURGERY Left 01/10/2021   Procedure: ACHILLES LENGTHENING/KIDNER;  Surgeon: Rosetta Posner, DPM;  Location: [facility];  Service: Podiatry;  Laterality: Left;   AMPUTATION Left 10/03/2021   Procedure: AMPUTATION BELOW KNEE;  Surgeon: Renford Dills, MD;  Location: [facility];  Service: Vascular;  Laterality: Left;   AMPUTATION Right 05/24/2022   Procedure: AMPUTATION BELOW KNEE;  Surgeon: Annice Needy, MD;  Location: [facility];  Service: General;  Laterality: Right;   AMPUTATION TOE Right 02/10/2016   Procedure: AMPUTATION TOE 3RD TOE;  Surgeon: Gwyneth Revels, DPM;  Location: [facility];  Service: Podiatry;  Laterality: Right;   AMPUTATION TOE Left 02/24/2020   Procedure: AMPUTATION TOE;  Surgeon: Rosetta Posner, DPM;  Location: [facility];  Service: Podiatry;  Laterality: Left;   APPLICATION OF WOUND VAC Left 02/29/2020   Procedure: APPLICATION OF WOUND VAC;   Surgeon: Rosetta Posner, DPM;  Location: [facility];  Service: Podiatry;  Laterality: Left;   BIOPSY  03/15/2023   Procedure: BIOPSY;  Surgeon: Wyline Mood, MD;  Location: [facility] ENDOSCOPY;  Service: Gastroenterology;;   BIOPSY  04/10/2023   Procedure: BIOPSY;  Surgeon: Norma Fredrickson, Boykin Nearing, MD;  Location: [facility];  Service: Gastroenterology;;   CARDIAC CATHETERIZATION     CARDIOVERSION [name] 02/13/2022   Procedure: CARDIOVERSION;  Surgeon: Lamar Blinks, MD;  Location: [facility];  Service: Cardiovascular;  Laterality: [name];   CARDIOVERSION [name] 04/23/2023   Procedure: CARDIOVERSION;  Surgeon: Antonieta Iba, MD;  Location: [facility];  Service: Cardiovascular;  Laterality: [name];   COLONOSCOPY WITH PROPOFOL [name] 11/18/2015   Procedure: COLONOSCOPY WITH PROPOFOL;  Surgeon: Scot Jun, MD;  Location: [facility] ENDOSCOPY;  Service: Endoscopy;  Laterality: [name];   CORONARY ARTERY BYPASS GRAFT     CORONARY STENT INTERVENTION [name] 02/02/2020   Procedure: CORONARY STENT INTERVENTION;  Surgeon: Marcina Millard, MD;  Location: [facility];  Service: Cardiovascular;  Laterality: [name];   ESOPHAGOGASTRODUODENOSCOPY (EGD) WITH PROPOFOL [name] 03/15/2023   Procedure: ESOPHAGOGASTRODUODENOSCOPY (EGD) WITH PROPOFOL;  Surgeon: Wyline Mood, MD;  Location: [facility] END[facility]COPY;  Service: Gastroenterology;  Laterality: [name];   FLEXIBLE SIGMOIDOSCOPY [name] 04/10/2023   Procedure: FLEXIBLE SIGMOIDOSCOPY;  Surgeon: Toledo, Boykin Nearing, MD;  Location: [facility]OS[facility];  Service: Gastroenterology;  Laterality: [name];   GASTROSTOMY [name] 03/18/2023   Procedure: INSERTION OF GASTROSTOMY TUBE;  Surgeon: Carolan Shiver, MD;  Location: [facility];  Service: General;  Laterality: [name];   IMPACTION REMOVAL  03/15/2023   Procedure: IMPACTION REMOVAL;  Surgeon: Wyline Mood, MD;  Location: [facility] ENDOSCOPY;  Service: Gastroenterology;;   INCISION AND DRAINAGE Left 08/07/2021   Procedure: INCISION AND DRAINAGE-Partial Calcanectomy;   Surgeon: Rosetta Posner, DPM;  Location: [facility];  Service: Podiatry;  Laterality: Left;   IRRIGATION AND DEBRIDEMENT FOOT Left 02/29/2020   Procedure: IRRIGATION AND DEBRIDEMENT FOOT;  Surgeon: Rosetta Posner, DPM;  Location: [facility];  Service: Podiatry;  Laterality: Left;   IRRIGATION AND DEBRIDEMENT FOOT Left 02/24/2020   Procedure: IRRIGATION AND DEBRIDEMENT FOOT;  Surgeon: Rosetta Posner, DPM;  Location: [facility];  Service: Podiatry;  Laterality: Left;   KNEE ARTHROSCOPY     LEFT HEART CATH AND CORS/GRAFTS ANGIOGRAPHY [name] 02/02/2020   Procedure: LEFT HEART CATH AND CORS/GRAFTS ANGIOGRAPHY;  Surgeon: Dalia Heading, MD;  Location: [facility];  Service: Cardiovascular;  Laterality: [name];   LOWER EXTREMITY ANGIOGRAPHY Left 02/25/2020   Procedure: Lower Extremity Angiography;  Surgeon: Annice Needy, MD;  Location: [facility];  Service: Cardiovascular;  Laterality: Left;   LOWER EXTREMITY ANGIOGRAPHY Left 01/04/2021   Procedure: LOWER EXTREMITY ANGIOGRAPHY;  Surgeon: Annice Needy, MD;  Location: [facility];  Service: Cardiovascular;  Laterality: Left;   LOWER EXTREMITY ANGIOGRAPHY Right 05/21/2022   Procedure: Lower Extremity Angiography;  Surgeon: Annice Needy, MD;  Location: [facility];  Service: Cardiovascular;  Laterality: Right;   METATARSAL HEAD EXCISION Left 01/10/2021   Procedure: METATARSAL HEAD EXCISION - LEFT 5th;  Surgeon: Rosetta Posner, DPM;  Location: [facility];  Service: Podiatry;  Laterality: Left;   PERIPHERAL VASCULAR CATHETERIZATION Right 01/24/2016   Procedure: Lower Extremity Angiography;  Surgeon: Renford Dills, MD;  Location: [facility];  Service: Cardiovascular;  Laterality: Right;   PERIPHERAL VASCULAR CATHETERIZATION Right 01/25/2016   Procedure: Lower Extremity Angiography;  Surgeon: Renford Dills, MD;  Location: [facility];  Service: Cardiovascular;  Laterality: Right;   PORTACATH PLACEMENT [name] 03/18/2023    Procedure: INSERTION PORT-A-CATH;  Surgeon: Carolan Shiver, MD;  Location: [facility];  Service: General;  Laterality: [name];   TOE AMPUTATION     TONSILLECTOMY       Social History:   reports that he quit smoking about 8 years ago. His smoking use included cigarettes. He started smoking about 53 years ago. He has a 22.5 pack-year smoking history. He has never used smokeless tobacco. He reports that he does not currently use alcohol after a past usage of about 3.0 standard drinks of alcohol per week. He reports that he does not use drugs.   Family History:  His family history includes Alcohol abuse in his father; Coronary artery disease in his father; Heart failure in his brother; Hypertension in his father.   Allergies Allergies  Allergen Reactions   Penicillins Other (See Comments)    Happened at 68 years old and pt. stated he passed out  He has tolerated amoxicillin/clavulanate and ampicillin/sulbactam   Statins     Other reaction(s): Muscle Pain Causes legs to ache per pt   Metformin And Related Diarrhea     Home Medications  Prior to Admission medications   Medication Sig Start Date End Date Taking? Authorizing Provider  acetaminophen (TYLENOL) 325 MG tablet Place 2 tablets (650 mg total) into feeding tube every 6 (six) hours as needed for mild pain (pain score 1-3) (or Fever >/= 101). 03/22/23  Yes Lurene Shadow, MD  amiodarone (PACERONE) 200 MG tablet Take twice a day through 1/7 and then once daily Patient taking differently: Take 200 mg by mouth daily. 05/03/23  Yes Wouk, Wilfred Curtis, MD  apixaban (ELIQUIS) 5 MG TABS tablet Place 1 tablet (5 mg total) into feeding tube 2 (two) times daily. 03/22/23  Yes Lurene Shadow, MD  ascorbic acid (VITAMIN C) 500 MG tablet Place 1 tablet (500 mg total) into feeding tube daily. 03/22/23  Yes Lurene Shadow, MD  Cholecalciferol 25 MCG (1000 UT) tablet Place 1 tablet (1,000 Units total) into feeding tube daily. 03/22/23  Yes Lurene Shadow, MD  clonazePAM (KLONOPIN) 0.5 MG tablet Place 1 tablet (0.5 mg total) into feeding tube 2 (two) times daily. 03/22/23  Yes Lurene Shadow, MD  Cyanocobalamin (VITAMIN B-12) 5000 MCG TBDP Place 10,000 mcg into feeding tube daily. 03/22/23  Yes Lurene Shadow, MD  diltiazem (CARDIZEM CD) 300 MG 24 hr capsule Take 300 mg by mouth daily.   Yes [provider]  diphenoxylate-atropine (LOMOTIL) 2.5-0.025 MG tablet Place 1 tablet into feeding tube 4 (four) times daily. 05/03/23  Yes Wouk, Wilfred Curtis, MD  doxycycline (VIBRAMYCIN) 100 MG capsule Take 100 mg by mouth 2 (two) times daily. 06/28/23  Yes [provider]  ezetimibe (ZETIA) 10 MG tablet Place 1 tablet (10 mg total) into feeding tube at bedtime. 03/22/23  Yes Lurene Shadow, MD  ferrous sulfate 300 (60 Fe) MG/5ML syrup Place 5 mLs (300 mg total) into feeding tube daily. Patient taking differently: Place 6.8 mLs into feeding tube daily. 03/22/23  Yes Lurene Shadow, MD  finasteride (PROSCAR) 5 MG tablet Take 1 tablet by mouth daily. 02/25/23  Yes [provider]  furosemide (LASIX) 40 MG tablet Take 40 mg by mouth daily.   Yes [provider]  gabapentin (NEURONTIN) 300 MG capsule Take 1 capsule by mouth at bedtime. 02/14/23  Yes [provider]  HUMALOG 100 UNIT/ML injection Inject 0-14 Units into the skin 3 (three) times daily with meals. Sliding scale 05/27/23  Yes [provider]  [employer]) OINT Apply 1 Application topically daily as needed.   Yes [provider]  insulin glargine (LANTUS) 100 UNIT/ML injection Inject 40 Units into the skin 2 (two) times daily. 03/27/23  Yes [provider]  isosorbide mononitrate (IMDUR) 60 MG 24 hr tablet Take 60 mg by mouth in the morning and at bedtime.   Yes [provider]  levothyroxine (SYNTHROID) 25 MCG tablet Place 1 tablet (25 mcg total) into feeding tube daily at 6 (six) AM. 03/22/23  Yes  Lurene Shadow, MD  loperamide HCl (IMODIUM) 1 MG/7.5ML suspension Place 15 mLs (2 mg total) into feeding tube every 6 (six) hours as needed for diarrhea or loose stools. 05/03/23  Yes Wouk, Wilfred Curtis, MD  losartan (COZAAR) 50 MG tablet Take 50 mg by mouth daily.   Yes [provider]  metoprolol tartrate (LOPRESSOR) 25 MG tablet Take 0.5 tablets (12.5 mg total) by mouth 2 (two) times daily. 05/03/23  Yes Wouk, Wilfred Curtis, MD  Multiple Vitamins-Minerals (MULTIVITAMIN WITH MINERALS) tablet Take 1 tablet by mouth daily.   Yes [provider]  nitrofurantoin, macrocrystal-monohydrate, (MACROBID) 100 MG capsule Take 100 mg by mouth daily.   Yes [provider]  nitroGLYCERIN (NITROSTAT) 0.4 MG SL tablet Place 1 tablet (0.4 mg total) under the tongue every 5 (five) minutes as needed for chest pain. 06/20/22  Yes [name], MD  nystatin (MYCOSTATIN/NYSTOP) powder Apply 1 Application topically 2 (two) times daily. Under belly   Yes [provider]  ondansetron (ZOFRAN) 4 MG tablet Place 1 tablet (4 mg total) into feeding tube every 8 (eight) hours as needed for nausea. 05/28/23  Yes [name], MD  pramipexole (MIRAPEX) 1 MG tablet Place 2 tablets (2 mg total) into feeding tube at bedtime. 03/22/23  Yes Lurene Shadow, MD  primidone (MYSOLINE) 250 MG tablet Place 1 tablet (250 mg total) into feeding tube 2 (two) times daily. 03/22/23  Yes Lurene Shadow, MD  tamsulosin (FLOMAX) 0.4 MG CAPS capsule Take 0.4 mg by mouth.  Yes [provider]  Vitamins A & D (VITAMIN A & D) ointment Apply 1 Application topically as needed for dry skin. Apply to stumps   Yes [provider]  zinc sulfate, 50mg  elemental zinc, 220 (50 Zn) MG capsule Take 220 mg by mouth daily.   Yes [provider]  fiber supplement, BANATROL TF, liquid Place 60 mLs into feeding tube 2 (two) times daily. 05/03/23   Wouk, Wilfred Curtis, MD  lidocaine-prilocaine (EMLA)  cream Apply 1 Application topically as needed. 05/28/23   [name], MD  Water For Irrigation, Sterile (FREE WATER) SOLN Place 30 mLs into feeding tube 6 (six) times daily. 05/03/23   Wouk, Wilfred Curtis, MD     Critical care time: 49 minutes      Raechel Chute, MD [facility] 07/01/2023 5:46 PM

## 2023-07-01 NOTE — Consult Note (Signed)
 PHARMACY CONSULT NOTE - ELECTROLYTES  Pharmacy Consult for Electrolyte Monitoring and Replacement   Recent Labs: Height: 5\' 5"  (165.1 cm) Weight: 121.5 kg (267 lb 13.7 oz) IBW/kg (Calculated) : 61.5 Estimated Creatinine Clearance: 77 mL/min (by C-G formula based on SCr of 1.11 mg/dL). Potassium (mmol/L)  Date Value  07/01/2023 5.6 (H)  07/12/2011 4.4   Magnesium (mg/dL)  Date Value  13/11/6576 2.2   Calcium (mg/dL)  Date Value  46/96/2952 8.8 (L)   Calcium, Total (mg/dL)  Date Value  84/13/2440 8.8   Albumin (g/dL)  Date Value  02/24/2535 2.8 (L)   Phosphorus (mg/dL)  Date Value  64/40/3474 3.4   Sodium (mmol/L)  Date Value  07/01/2023 135  07/12/2011 141   Assessment  [name]. is a 68 y.o. male presenting with unresponsive and resp failure. PMH significant for CHF, COPD, CAD, diabetes, PVD, bilateral below the knee amputations . Pharmacy has been consulted to monitor and replace electrolytes.  Diet: npo MIVF:propofol and fentanyl drip Pertinent medications: n/a  Goal of Therapy: Electrolytes WNL  Plan:  Continue sodium zirconium cyclosilicate 10 grams per tube BID Check BMP, Mg, Phos with AM labs.  Thank you for involving pharmacy in this patient's care.   Effie Shy, PharmD Pharmacy Resident  07/01/2023 6:44 AM

## 2023-07-01 NOTE — Progress Notes (Signed)
 PHARMACY - ANTICOAGULATION CONSULT NOTE  Pharmacy Consult for heparin infusion Indication: atrial fibrillation  Allergies  Allergen Reactions   Penicillins Other (See Comments)    Happened at 68 years old and pt. stated he passed out  He has tolerated amoxicillin/clavulanate and ampicillin/sulbactam   Statins     Other reaction(s): Muscle Pain Causes legs to ache per pt   Metformin And Related Diarrhea    Patient Measurements: Height: 5\' 5"  (165.1 cm) Weight: 121.9 kg (268 lb 11.9 oz) IBW/kg (Calculated) : 61.5 Heparin Dosing Weight: 90.8 kg  Vital Signs: Temp: 99.3 F (37.4 C) (03/03 0300) BP: 113/67 (03/03 0300) Pulse Rate: 81 (03/03 0300)  Labs: Recent Labs    06/29/23 1142 06/29/23 1240 06/29/23 1824 06/30/23 0534 06/30/23 1147 06/30/23 1632 06/30/23 1948 06/30/23 2247 07/01/23 0222  HGB 9.7*  --   --  9.0*  --   --   --   --  9.1*  HCT 31.2*  --   --  28.2*  --   --   --   --  29.3*  PLT 203  --   --  195  --   --   --   --  188  APTT  --  40*  --   --   --   --  77*  --  86*  LABPROT  --  16.2*  --   --   --   --   --   --   --   INR  --  1.3*  --   --   --   --   --   --   --   HEPARINUNFRC  --   --   --   --   --   --   --   --  0.55  CREATININE 1.39*  --    < > 1.19   < > 1.21  --  1.15 1.11  TROPONINIHS 11  --   --   --   --   --   --   --   --    < > = values in this interval not displayed.    Estimated Creatinine Clearance: 77.2 mL/min (by C-G formula based on SCr of 1.11 mg/dL).   Medical History: Past Medical History:  Diagnosis Date   Arrhythmia    atrial fibrillation   Asthma    CHF (congestive heart failure) (HCC)    COPD (chronic obstructive pulmonary disease) (HCC)    Coronary artery disease    Depression    Diabetes mellitus without complication (HCC)    Gout    History anabolic steroid use    Hyperlipidemia    Hypertension    Hypogonadism in male    MI (myocardial infarction) (HCC)    Morbid obesity (HCC)    Myocardial  infarction (HCC)    Peripheral vascular disease (HCC)    Perirectal abscess    Pleurisy    Sleep apnea    CPAP at night, no oxygen   Varicella     Medications:  Scheduled:   Chlorhexidine Gluconate Cloth  6 each Topical Daily   famotidine  20 mg Per Tube BID   levothyroxine  25 mcg Per Tube Q0600   mupirocin ointment  1 Application Nasal BID   mouth rinse  15 mL Mouth Rinse Q2H   sodium zirconium cyclosilicate  10 g Per Tube BID   vecuronium  10 mg Intravenous Once    Assessment: 68 y.o.  male presenting with unresponsive and resp failure. PMH significant for CHF, COPD, CAD, diabetes, PVD, bilateral below the knee amputations. He takes apixaban PTA with last dose 06/28/23 pm  Date Time aPTT/HL Rate/Comment  3/2 1948 aPTT 77 Therapeutic x1 3/3       0222   aPTT 86          Therapeutic X 2   Goal of Therapy:  anti-Xa level  0.3-0.7 units/ml aPTT 66 - 102 seconds Monitor platelets by anticoagulation protocol: Yes   Plan:  3/3 @ 0222:  HL = 0.55,  aPTT = 86 - aPTT therapeutic X 2, now correlating with HL  - Will use HL to guide dosing from here on - Will continue pt on current rate and recheck HL on 3/4 with AM labs.  - CBC daily  Continue to monitor H&H and platelets  Thank you for involving pharmacy in this patient's care.   Felice Deem D Clinical Pharmacist 07/01/2023 3:33 AM

## 2023-07-01 NOTE — Plan of Care (Signed)
  Problem: Clinical Measurements: Goal: Ability to maintain clinical measurements within normal limits will improve Outcome: Not Progressing Goal: Will remain free from infection Outcome: Progressing Goal: Diagnostic test results will improve Outcome: Not Progressing Goal: Respiratory complications will improve Outcome: Not Progressing Goal: Cardiovascular complication will be avoided Outcome: Progressing   Problem: Elimination: Goal: Will not experience complications related to urinary retention Outcome: Progressing

## 2023-07-02 ENCOUNTER — Ambulatory Visit: Payer: Medicare Other

## 2023-07-02 ENCOUNTER — Ambulatory Visit

## 2023-07-02 DIAGNOSIS — I5033 Acute on chronic diastolic (congestive) heart failure: Secondary | ICD-10-CM | POA: Diagnosis not present

## 2023-07-02 DIAGNOSIS — J9601 Acute respiratory failure with hypoxia: Secondary | ICD-10-CM | POA: Diagnosis not present

## 2023-07-02 DIAGNOSIS — I4891 Unspecified atrial fibrillation: Secondary | ICD-10-CM | POA: Diagnosis not present

## 2023-07-02 DIAGNOSIS — N179 Acute kidney failure, unspecified: Secondary | ICD-10-CM | POA: Diagnosis not present

## 2023-07-02 LAB — BASIC METABOLIC PANEL
Anion gap: 9 (ref 5–15)
BUN: 32 mg/dL — ABNORMAL HIGH (ref 8–23)
CO2: 25 mmol/L (ref 22–32)
Calcium: 8.8 mg/dL — ABNORMAL LOW (ref 8.9–10.3)
Chloride: 101 mmol/L (ref 98–111)
Creatinine, Ser: 1.13 mg/dL (ref 0.61–1.24)
GFR, Estimated: >60 mL/min (ref >60)
Glucose, Bld: 180 mg/dL — ABNORMAL HIGH (ref 70–99)
Potassium: 5.4 mmol/L — ABNORMAL HIGH (ref 3.5–5.1)
Sodium: 135 mmol/L (ref 135–145)

## 2023-07-02 LAB — GLUCOSE, CAPILLARY
Glucose-Capillary: 126 mg/dL — ABNORMAL HIGH (ref 70–99)
Glucose-Capillary: 128 mg/dL — ABNORMAL HIGH (ref 70–99)
Glucose-Capillary: 138 mg/dL — ABNORMAL HIGH (ref 70–99)
Glucose-Capillary: 148 mg/dL — ABNORMAL HIGH (ref 70–99)
Glucose-Capillary: 152 mg/dL — ABNORMAL HIGH (ref 70–99)
Glucose-Capillary: 172 mg/dL — ABNORMAL HIGH (ref 70–99)

## 2023-07-02 LAB — PROCALCITONIN: Procalcitonin: 0.28 ng/mL

## 2023-07-02 LAB — CBC
HCT: 30 % — ABNORMAL LOW (ref 39.0–52.0)
Hemoglobin: 9.4 g/dL — ABNORMAL LOW (ref 13.0–17.0)
MCH: 30.9 pg (ref 26.0–34.0)
MCHC: 31.3 g/dL (ref 30.0–36.0)
MCV: 98.7 fL (ref 80.0–100.0)
Platelets: 177 10*3/uL (ref 150–400)
RBC: 3.04 MIL/uL — ABNORMAL LOW (ref 4.22–5.81)
RDW: 17.9 % — ABNORMAL HIGH (ref 11.5–15.5)
WBC: 6.3 10*3/uL (ref 4.0–10.5)
nRBC: 0 % (ref 0.0–0.2)

## 2023-07-02 LAB — MAGNESIUM
Magnesium: 1.9 mg/dL (ref 1.7–2.4)
Magnesium: 1.7 mg/dL (ref 1.7–2.4)

## 2023-07-02 LAB — HEPARIN LEVEL (UNFRACTIONATED): Heparin Unfractionated: 0.23 [IU]/mL — ABNORMAL LOW (ref 0.30–0.70)

## 2023-07-02 LAB — PHOSPHORUS
Phosphorus: 4.5 mg/dL (ref 2.5–4.6)
Phosphorus: 3.7 mg/dL (ref 2.5–4.6)

## 2023-07-02 MED ORDER — INSULIN ASPART 100 UNIT/ML IJ SOLN
0.0000 [IU] | INTRAMUSCULAR | Status: DC
  Administered 2023-07-02: 3 [IU] via SUBCUTANEOUS
  Administered 2023-07-02 (×3): 2 [IU] via SUBCUTANEOUS
  Administered 2023-07-02: 3 [IU] via SUBCUTANEOUS
  Administered 2023-07-02 – 2023-07-03 (×5): 2 [IU] via SUBCUTANEOUS
  Administered 2023-07-03 (×2): 3 [IU] via SUBCUTANEOUS
  Administered 2023-07-04 (×2): 5 [IU] via SUBCUTANEOUS
  Administered 2023-07-04: 3 [IU] via SUBCUTANEOUS
  Administered 2023-07-04: 5 [IU] via SUBCUTANEOUS
  Administered 2023-07-04: 3 [IU] via SUBCUTANEOUS
  Administered 2023-07-05: 5 [IU] via SUBCUTANEOUS
  Administered 2023-07-05 (×2): 3 [IU] via SUBCUTANEOUS
  Administered 2023-07-05: 5 [IU] via SUBCUTANEOUS
  Administered 2023-07-05: 8 [IU] via SUBCUTANEOUS
  Administered 2023-07-06: 3 [IU] via SUBCUTANEOUS
  Administered 2023-07-06: 2 [IU] via SUBCUTANEOUS
  Administered 2023-07-06: 3 [IU] via SUBCUTANEOUS
  Administered 2023-07-06 (×3): 2 [IU] via SUBCUTANEOUS
  Administered 2023-07-07 (×3): 3 [IU] via SUBCUTANEOUS
  Administered 2023-07-07: 2 [IU] via SUBCUTANEOUS
  Administered 2023-07-07: 3 [IU] via SUBCUTANEOUS
  Administered 2023-07-08: 5 [IU] via SUBCUTANEOUS
  Administered 2023-07-08: 3 [IU] via SUBCUTANEOUS
  Administered 2023-07-08 (×2): 2 [IU] via SUBCUTANEOUS
  Filled 2023-07-02 (×31): qty 1

## 2023-07-02 MED ORDER — HEPARIN BOLUS VIA INFUSION
1400.0000 [IU] | Freq: Once | INTRAVENOUS | Status: AC
  Administered 2023-07-02: 1400 [IU] via INTRAVENOUS
  Filled 2023-07-02: qty 1400

## 2023-07-02 MED ORDER — DOCUSATE SODIUM 50 MG/5ML PO LIQD
100.0000 mg | Freq: Two times a day (BID) | ORAL | Status: DC
Start: 2023-07-02 — End: 2023-07-03
  Administered 2023-07-02 – 2023-07-03 (×3): 100 mg
  Filled 2023-07-02 (×3): qty 10

## 2023-07-02 MED ORDER — APIXABAN 5 MG PO TABS
5.0000 mg | ORAL_TABLET | Freq: Two times a day (BID) | ORAL | Status: DC
  Administered 2023-07-02 – 2023-07-08 (×12): 5 mg
  Filled 2023-07-02 (×12): qty 1

## 2023-07-02 MED ORDER — FUROSEMIDE 10 MG/ML IJ SOLN
40.0000 mg | Freq: Once | INTRAMUSCULAR | Status: AC
  Administered 2023-07-02: 40 mg via INTRAVENOUS
  Filled 2023-07-02: qty 4

## 2023-07-02 MED ORDER — IPRATROPIUM-ALBUTEROL 0.5-2.5 (3) MG/3ML IN SOLN: 3.0000 mL | Freq: Four times a day (QID) | RESPIRATORY_TRACT | Status: DC | PRN

## 2023-07-02 MED ORDER — NEPRO/CARBSTEADY PO LIQD
1000.0000 mL | ORAL | Status: AC
  Administered 2023-07-02 – 2023-07-05 (×4): 1000 mL

## 2023-07-02 MED ORDER — NEPRO/CARBSTEADY PO LIQD: 1000.0000 mL | ORAL | Status: DC

## 2023-07-02 MED ORDER — DOCUSATE SODIUM 100 MG PO CAPS: 100.0000 mg | ORAL_CAPSULE | Freq: Two times a day (BID) | ORAL | Status: DC

## 2023-07-02 MED ORDER — POLYETHYLENE GLYCOL 3350 17 G PO PACK
17.0000 g | PACK | Freq: Every day | ORAL | Status: DC
  Administered 2023-07-02 – 2023-07-03 (×2): 17 g
  Filled 2023-07-02 (×2): qty 1

## 2023-07-02 NOTE — Progress Notes (Signed)
 PHARMACY - ANTICOAGULATION CONSULT NOTE  Pharmacy Consult for heparin infusion --> transition to apixaban  Indication: atrial fibrillation  Allergies  Allergen Reactions   Penicillins Other (See Comments)    Happened at 68 years old and pt. stated he passed out  He has tolerated amoxicillin/clavulanate and ampicillin/sulbactam   Statins     Other reaction(s): Muscle Pain Causes legs to ache per pt   Metformin And Related Diarrhea    Patient Measurements: Height: 5\' 5"  (165.1 cm) Weight: 120.6 kg (265 lb 14 oz) IBW/kg (Calculated) : 61.5 Heparin Dosing Weight: 90.8 kg  Vital Signs: Temp: 100 F (37.8 C) (03/04 1300) Temp Source: Bladder (03/04 1300) BP: 92/62 (03/04 1300) Pulse Rate: 120 (03/04 1300)  Labs: Recent Labs    06/30/23 0534 06/30/23 1147 06/30/23 1948 06/30/23 2247 07/01/23 0222 07/02/23 0354  HGB 9.0*  --   --   --  9.1* 9.4*  HCT 28.2*  --   --   --  29.3* 30.0*  PLT 195  --   --   --  188 177  APTT  --   --  77*  --  86*  --   HEPARINUNFRC  --   --   --   --  0.55 0.23*  CREATININE 1.19   < >  --  1.15 1.11 1.13   < > = values in this interval not displayed.    Estimated Creatinine Clearance: 75.3 mL/min (by C-G formula based on SCr of 1.13 mg/dL).   Medical History: Past Medical History:  Diagnosis Date   Arrhythmia    atrial fibrillation   Asthma    CHF (congestive heart failure) (HCC)    COPD (chronic obstructive pulmonary disease) (HCC)    Coronary artery disease    Depression    Diabetes mellitus without complication (HCC)    Gout    History anabolic steroid use    Hyperlipidemia    Hypertension    Hypogonadism in male    MI (myocardial infarction) (HCC)    Morbid obesity (HCC)    Myocardial infarction (HCC)    Peripheral vascular disease (HCC)    Perirectal abscess    Pleurisy    Sleep apnea    CPAP at night, no oxygen   Varicella     Medications:  Scheduled:   Chlorhexidine Gluconate Cloth  6 each Topical Daily    docusate  100 mg Per Tube BID   feeding supplement (PROSource TF20)  60 mL Per Tube BID   feeding supplement (VITAL HIGH PROTEIN)  1,000 mL Per Tube Q24H   free water  30 mL Per Tube Q4H   insulin aspart  0-15 Units Subcutaneous Q4H   levothyroxine  25 mcg Per Tube Q0600   mupirocin ointment  1 Application Nasal BID   mouth rinse  15 mL Mouth Rinse Q2H   polyethylene glycol  17 g Per Tube Daily   sodium zirconium cyclosilicate  10 g Per Tube BID    Assessment: 68 y.o. male presenting with unresponsive and resp failure. PMH significant for CHF, COPD, CAD, diabetes, PVD, bilateral below the knee amputations. He takes apixaban PTA with last dose 06/28/23 pm. Pharmacy was consulted to manage this patients heparin.    Plan:  -- Since patient is refusing to let lab take take their heparin levels per provider will transitioned them back to their home apixaban -- Scheduled 5 mg apixaban BID for atrial fibrillation to be given today after the heparin gtt is stopped  Thank you for involving pharmacy in this patient's care.   Effie Shy, PharmD Pharmacy Resident  07/02/2023 4:38 PM

## 2023-07-02 NOTE — Plan of Care (Signed)
  Problem: Clinical Measurements: Goal: Ability to maintain clinical measurements within normal limits will improve Outcome: Progressing Goal: Will remain free from infection Outcome: Progressing Goal: Diagnostic test results will improve Outcome: Progressing Goal: Respiratory complications will improve Outcome: Not Progressing Goal: Cardiovascular complication will be avoided Outcome: Progressing   Problem: Nutrition: Goal: Adequate nutrition will be maintained Outcome: Progressing   Problem: Elimination: Goal: Will not experience complications related to urinary retention Outcome: Progressing

## 2023-07-02 NOTE — Consult Note (Signed)
 PHARMACY CONSULT NOTE - ELECTROLYTES  Pharmacy Consult for Electrolyte Monitoring and Replacement   Recent Labs: Height: 5\' 5"  (165.1 cm) Weight: 120.6 kg (265 lb 14 oz) IBW/kg (Calculated) : 61.5 Estimated Creatinine Clearance: 75.3 mL/min (by C-G formula based on SCr of 1.13 mg/dL). Potassium (mmol/L)  Date Value  07/02/2023 5.4 (H)  07/12/2011 4.4   Magnesium (mg/dL)  Date Value  72/53/6644 1.9   Calcium (mg/dL)  Date Value  03/47/4259 8.8 (L)   Calcium, Total (mg/dL)  Date Value  56/38/7564 8.8   Albumin (g/dL)  Date Value  33/29/5188 2.8 (L)   Phosphorus (mg/dL)  Date Value  41/66/0630 4.5   Sodium (mmol/L)  Date Value  07/02/2023 135  07/12/2011 141   Assessment  Alec A Rettig Montez Hageman. is a 68 y.o. male presenting with unresponsive and resp failure. PMH significant for CHF, COPD, CAD, diabetes, PVD, bilateral below the knee amputations . Pharmacy has been consulted to monitor and replace electrolytes.  Diet: npo MIVF:propofol and fentanyl drip Pertinent medications: n/a  Goal of Therapy: Electrolytes WNL  Plan:  Continue sodium zirconium cyclosilicate 10 grams per tube BID Check BMP, Mg, Phos with AM labs.  Thank you for involving pharmacy in this patient's care.   [name], PharmD Pharmacy Resident  07/02/2023 6:55 AM

## 2023-07-02 NOTE — Progress Notes (Signed)
 Pt extubated to bipap per order sating 93% on 35%

## 2023-07-02 NOTE — Progress Notes (Signed)
 NAME:  Alec Percival., MRN:  [id number], DOB:  [birth year], LOS: 3 ADMISSION DATE:  06/29/2023, CHIEF COMPLAINT:  Respiratory Failure   History of Present Illness:   68 year old male with a history of CAD, Afib, CHF, esophageal cancer (s/p chemoradiation), PEG tube, DM, obesity, OSA and bilateral above knee amputations who presented to the hospital on 3/1 with respiratory failure, requiring intubation on arrival. He was recently discharged from the hospital on 2/16 after brief admission with chest discomfort.  Given presentation with respiratory distress, the patient was intubated by the ED provider on arrival. He was very hypoxic and unresponsive per family and EMS. He was found by wife after likely fall, with EMS needing to bag him secondary to hypoxia.  Blood work on presentation to the ED was notable for AKI, hyperkalemia, elevated BNP, and rhinovirus on his respiratory viral panel. He is admitted to the ICU for management of respiratory failure.  Pertinent  Medical History   -CAD -CHF -COPD -DM -Gout -Obesity -PAD -OSA  Significant Hospital Events: Including procedures, antibiotic start and stop dates in addition to other pertinent events   06/30/23 - intubated ventilated 07/01/23 - did not pass SBT, wife updated at bedside 07/02/23 - diuresed with furosemide, passed SBT, extubated to BiPAP  Interim History / Subjective:  Did well off sedation on SBT this AM, extubated to BiPAP  Objective   Blood pressure 123/86, pulse (!) 120, temperature 99.3 F (37.4 C), temperature source Bladder, resp. rate 15, height 5\' 5"  (1.651 m), weight 120.6 kg, SpO2 99%.    Vent Mode: PSV FiO2 (%):  [35 %-50 %] 35 % Set Rate:  [18 bmp] 18 bmp Vt Set:  [500 mL] 500 mL PEEP:  [5 cmH20] 5 cmH20 Pressure Support:  [5 cmH20] 5 cmH20 Plateau Pressure:  [14 cmH20-15 cmH20] 14 cmH20   Intake/Output Summary (Last 24 hours) at 07/02/2023 1212 Last data filed at 07/02/2023 1000 Gross per 24 hour  Intake  1981.64 ml  Output 1050 ml  Net 931.64 ml   Filed Weights   06/30/23 0400 07/01/23 0429 07/02/23 0353  Weight: 121.9 kg 121.5 kg 120.6 kg    Examination: Physical Exam Constitutional:      General: He is not in acute distress.    Appearance: He is ill-appearing.  Cardiovascular:     Rate and Rhythm: Normal rate and regular rhythm.  Pulmonary:     Comments: Ventilated breath sounds bilaterally Abdominal:     General: There is distension.     Palpations: Abdomen is soft.  Neurological:     Mental Status: He is disoriented.      Assessment & Plan:   #Acute Hypoxic Respiratory Failure #Acute on Chronic HFpEF #Afib #AKI #History of Esophageal Cancer on radiation therapy  Neuro - on fentanyl and propofol for analgosedation. Did well with WUA and SBT, discontinue analgosedation. CV - elevated BNP on presentation, hypotensive after initiation of analgosedation and required low dose nor-epinephrine yesterday. With WUA and SBT, BP normalized, and given concern for CHF contributing to presentation, I've decided to give him IV furosemide to optimize prior to extubation. Pulm - respiratory failure in the setting of rhinovirus infection, with possible superimposed bacterial infection. Other considerations on the differential include pulmonary edema as well as radiation pneumonitis (with active radiation therapy to esophageal tumor). Passed SBT, diuresed, and extubated to BiPAP (continue today, switch to nasal cannula in the afternoon). GI - will re-initiate tube feeds at trickle (hold while on BiPAP) and increase rate  slowly monitoring for any discharge from around the insertion site. D/C SUP Renal - kidney function improving, monitor, avoid nephrotoxins Endo - resume home levothyroxine, ICU glycemic protocol Hem/Onc - heparin gtt for afib, history of esophageal cancer s/p chemoradiation ID - will broaden antibiotics for management of possible aspiration pneumonia. Broaden to Vanc/Zosyn  given positive MRSA screen. Goal to treat for 7 days (Day 1 3/3)  Best Practice (right click and "Reselect all SmartList Selections" daily)   Diet/type: tubefeeds DVT prophylaxis systemic heparin Pressure ulcer(s): present on admission  GI prophylaxis: [name] Lines: Central line and yes and it is still needed Foley:  Yes, and it is still needed Code Status:  full code Last date of multidisciplinary goals of care discussion [07/02/2023]  Labs   CBC: Recent Labs  Lab 06/29/23 1142 06/30/23 0534 07/01/23 0222 07/02/23 0354  WBC 7.4 6.3 7.8 6.3  HGB 9.7* 9.0* 9.1* 9.4*  HCT 31.2* 28.2* 29.3* 30.0*  MCV 98.4 96.2 99.3 98.7  PLT 203 195 188 177    Basic Metabolic Panel: Recent Labs  Lab 06/30/23 0534 06/30/23 1147 06/30/23 1632 06/30/23 2247 07/01/23 0222 07/01/23 1647 07/02/23 0354  NA 135 137 136 135 135  --  135  K 6.0* 6.0* 5.8* 5.6* 5.6*  --  5.4*  CL 100 102 102 102 100  --  101  CO2 29 28 27 27 28   --  25  GLUCOSE 88 84 114* 87 100*  --  180*  BUN 62* 60* 54* 50* 47*  --  32*  CREATININE 1.19 1.22 1.21 1.15 1.11  --  1.13  CALCIUM 8.8* 8.9 8.7* 8.7* 8.8*  --  8.8*  MG 2.6*  --   --   --  2.2 2.0 1.9  PHOS 3.4  --   --   --   --  4.1 4.5   GFR: Estimated Creatinine Clearance: 75.3 mL/min (by C-G formula based on SCr of 1.13 mg/dL). Recent Labs  Lab 06/29/23 1142 06/29/23 1411 06/29/23 1606 06/29/23 1824 06/30/23 0534 07/01/23 0222 07/02/23 0354  PROCALCITON  --   --   --   --  0.33 0.27 0.28  WBC 7.4  --   --   --  6.3 7.8 6.3  LATICACIDVEN 1.0 1.2 1.1 0.9  --   --   --     Liver Function Tests: Recent Labs  Lab 06/29/23 1142  AST 15  ALT 17  ALKPHOS 122  BILITOT 0.4  PROT 6.8  ALBUMIN 2.8*   Recent Labs  Lab 06/29/23 1142  LIPASE 22   No results for input(s): "AMMONIA" in the last 168 hours.  ABG    Component Value Date/Time   PHART 7.43 06/29/2023 1257   PCO2ART 54 (H) 06/29/2023 1257   PO2ART 70 (L) 06/29/2023 1257   HCO3 35.8 (H)  06/29/2023 1257   ACIDBASEDEF 0.8 03/15/2022 1512   O2SAT 96 06/29/2023 1257     Coagulation Profile: Recent Labs  Lab 06/29/23 1240  INR 1.3*    Cardiac Enzymes: No results for input(s): "CKTOTAL", "CKMB", "CKMBINDEX", "TROPONINI" in the last 168 hours.  HbA1C: Hgb A1c MFr Bld  Date/Time Value Ref Range Status  03/15/2023 04:02 AM 7.8 (H) 4.8 - 5.6 % Final    Comment:    (NOTE) Pre diabetes:          5.7%-6.4%  Diabetes:              >6.4%  Glycemic control for   <  7.0% adults with diabetes   05/22/2022 04:40 AM 11.2 (H) 4.8 - 5.6 % Final    Comment:    (NOTE) Pre diabetes:          5.7%-6.4%  Diabetes:              >6.4%  Glycemic control for   <7.0% adults with diabetes     CBG: Recent Labs  Lab 07/01/23 1929 07/01/23 2344 07/02/23 0331 07/02/23 0800 07/02/23 1147  GLUCAP 128* 145* 172* 148* 128*    Review of Systems:   [name]  Past Medical History:  He,  has a past medical history of Arrhythmia, Asthma, CHF (congestive heart failure) (HCC), COPD (chronic obstructive pulmonary disease) (HCC), Coronary artery disease, Depression, Diabetes mellitus without complication (HCC), Gout, History anabolic steroid use, Hyperlipidemia, Hypertension, Hypogonadism in male, MI (myocardial infarction) (HCC), Morbid obesity (HCC), Myocardial infarction (HCC), Peripheral vascular disease (HCC), Perirectal abscess, Pleurisy, Sleep apnea, and Varicella.   Surgical History:   Past Surgical History:  Procedure Laterality Date   ABDOMINAL AORTIC ANEURYSM REPAIR     ACHILLES TENDON SURGERY Left 01/10/2021   Procedure: ACHILLES LENGTHENING/KIDNER;  Surgeon: Rosetta Posner, DPM;  Location: [facility];  Service: Podiatry;  Laterality: Left;   AMPUTATION Left 10/03/2021   Procedure: AMPUTATION BELOW KNEE;  Surgeon: Renford Dills, MD;  Location: [facility];  Service: Vascular;  Laterality: Left;   AMPUTATION Right 05/24/2022   Procedure: AMPUTATION BELOW KNEE;  Surgeon: Annice Needy, MD;  Location: [facility];  Service: General;  Laterality: Right;   AMPUTATION TOE Right 02/10/2016   Procedure: AMPUTATION TOE 3RD TOE;  Surgeon: Gwyneth Revels, DPM;  Location: [facility];  Service: Podiatry;  Laterality: Right;   AMPUTATION TOE Left 02/24/2020   Procedure: AMPUTATION TOE;  Surgeon: Rosetta Posner, DPM;  Location: [facility];  Service: Podiatry;  Laterality: Left;   APPLICATION OF WOUND VAC Left 02/29/2020   Procedure: APPLICATION OF WOUND VAC;  Surgeon: Rosetta Posner, DPM;  Location: [facility];  Service: Podiatry;  Laterality: Left;   BIOPSY  03/15/2023   Procedure: BIOPSY;  Surgeon: Wyline Mood, MD;  Location: [facility]OSCOPY;  Service: Gastroenterology;;   BIOPSY  04/10/2023   Procedure: BIOPSY;  Surgeon: Norma Fredrickson, Boykin Nearing, MD;  Location: [facility] ENDOS[facility];  Service: Gastroenterology;;   CARDIAC CATHETERIZATION     CARDIOVERSION [name] 02/13/2022   Procedure: CARDIOVERSION;  Surgeon: Lamar Blinks, MD;  Location: [facility];  Service: Cardiovascular;  Laterality: [name];   CARDIOVERSION [name] 04/23/2023   Procedure: CARDIOVERSION;  Surgeon: Antonieta Iba, MD;  Location: [facility];  Service: Cardiovascular;  Laterality: [name];   COLONOSCOPY WITH PROPOFOL [name] 11/18/2015   Procedure: COLONOSCOPY WITH PROPOFOL;  Surgeon: Scot Jun, MD;  Location: [facility] ENDOSCOPY;  Service: Endoscopy;  Laterality: [name];   CORONARY ARTERY BYPASS GRAFT     CORONARY STENT INTERVENTION [name] 02/02/2020   Procedure: CORONARY STENT INTERVENTION;  Surgeon: Marcina Millard, MD;  Location: [facility];  Service: Cardiovascular;  Laterality: [name];   ESOPHAGOGASTRODUODENOSCOPY (EGD) WITH PROPOFOL [name] 03/15/2023   Procedure: ESOPHAGOGASTRODUODENOSCOPY (EGD) WITH PROPOFOL;  Surgeon: Wyline Mood, MD;  Location[facility];  Service: Gastroenterology;  Laterality: [name];   FLEXIBLE SIGMOIDOSCOPY [name] 04/10/2023   Procedure: FLEXIBLE SIGMOIDOSCOPY;  Surgeon: Toledo, Boykin Nearing, MD;  Location: [facility]  ENDOSCOPY;  Service: Gastroenterology;  Laterality: [name];   GASTROSTOMY [name] 03/18/2023   Procedure: INSERTION OF GASTROSTOMY TUBE;  Surgeon: Carolan Shiver, MD;  Location: [facility];  Service: General;  Laterality:  [name];   IMPACTION REMOVAL  03/15/2023   Procedure: IMPACTION REMOVAL;  Surgeon: Wyline Mood, MD;  Location: [facility] ENDOSCOPY;  Service: Gastroenterology;;   INCISION AND DRAINAGE Left 08/07/2021   Procedure: INCISION AND DRAINAGE-Partial Calcanectomy;  Surgeon: Rosetta Posner, DPM;  Location: [facility];  Service: Podiatry;  Laterality: Left;   IRRIGATION AND DEBRIDEMENT FOOT Left 02/29/2020   Procedure: IRRIGATION AND DEBRIDEMENT FOOT;  Surgeon: Rosetta Posner, DPM;  Location: [facility];  Service: Podiatry;  Laterality: Left;   IRRIGATION AND DEBRIDEMENT FOOT Left 02/24/2020   Procedure: IRRIGATION AND DEBRIDEMENT FOOT;  Surgeon: Rosetta Posner, DPM;  Location: [facility];  Service: Podiatry;  Laterality: Left;   KNEE ARTHROSCOPY     LEFT HEART CATH AND CORS/GRAFTS ANGIOGRAPHY [name] 02/02/2020   Procedure: LEFT HEART CATH AND CORS/GRAFTS ANGIOGRAPHY;  Surgeon: Dalia Heading, MD;  Location: [facility];  Service: Cardiovascular;  Laterality: [name];   LOWER EXTREMITY ANGIOGRAPHY Left 02/25/2020   Procedure: Lower Extremity Angiography;  Surgeon: Annice Needy, MD;  Location: [facility];  Service: Cardiovascular;  Laterality: Left;   LOWER EXTREMITY ANGIOGRAPHY Left 01/04/2021   Procedure: LOWER EXTREMITY ANGIOGRAPHY;  Surgeon: Annice Needy, MD;  Location: [facility];  Service: Cardiovascular;  Laterality: Left;   LOWER EXTREMITY ANGIOGRAPHY Right 05/21/2022   Procedure: Lower Extremity Angiography;  Surgeon: Annice Needy, MD;  Location: [facility];  Service: Cardiovascular;  Laterality: Right;   METATARSAL HEAD EXCISION Left 01/10/2021   Procedure: METATARSAL HEAD EXCISION - LEFT 5th;  Surgeon: Rosetta Posner, DPM;  Location: [facility];  Service: Podiatry;   Laterality: Left;   PERIPHERAL VASCULAR CATHETERIZATION Right 01/24/2016   Procedure: Lower Extremity Angiography;  Surgeon: Renford Dills, MD;  Location: [facility];  Service: Cardiovascular;  Laterality: Right;   PERIPHERAL VASCULAR CATHETERIZATION Right 01/25/2016   Procedure: Lower Extremity Angiography;  Surgeon: Renford Dills, MD;  Location: [facility];  Service: Cardiovascular;  Laterality: Right;   PORTACATH PLACEMENT [name] 03/18/2023   Procedure: INSERTION PORT-A-CATH;  Surgeon: Carolan Shiver, MD;  Location: [facility];  Service: General;  Laterality: [name];   TOE AMPUTATION     TONSILLECTOMY       Social History:   reports that he quit smoking about 8 years ago. His smoking use included cigarettes. He started smoking about 53 years ago. He has a 22.5 pack-year smoking history. He has never used smokeless tobacco. He reports that he does not currently use alcohol after a past usage of about 3.0 standard drinks of alcohol per week. He reports that he does not use drugs.   Family History:  His family history includes Alcohol abuse in his father; Coronary artery disease in his father; Heart failure in his brother; Hypertension in his father.   Allergies Allergies  Allergen Reactions   Penicillins Other (See Comments)    Happened at 68 years old and pt. stated he passed out  He has tolerated amoxicillin/clavulanate and ampicillin/sulbactam   Statins     Other reaction(s): Muscle Pain Causes legs to ache per pt   Metformin And Related Diarrhea     Home Medications  Prior to Admission medications   Medication Sig Start Date End Date Taking? Authorizing Provider  acetaminophen (TYLENOL) 325 MG tablet Place 2 tablets (650 mg total) into feeding tube every 6 (six) hours as needed for mild pain (pain score 1-3) (or Fever >/= 101). 03/22/23  Yes Lurene Shadow, MD  amiodarone (PACERONE)  200 MG tablet Take twice a day through 1/7 and then once daily Patient  taking differently: Take 200 mg by mouth daily. 05/03/23  Yes Wouk, Wilfred Curtis, MD  apixaban (ELIQUIS) 5 MG TABS tablet Place 1 tablet (5 mg total) into feeding tube 2 (two) times daily. 03/22/23  Yes Lurene Shadow, MD  ascorbic acid (VITAMIN C) 500 MG tablet Place 1 tablet (500 mg total) into feeding tube daily. 03/22/23  Yes Lurene Shadow, MD  Cholecalciferol 25 MCG (1000 UT) tablet Place 1 tablet (1,000 Units total) into feeding tube daily. 03/22/23  Yes Lurene Shadow, MD  clonazePAM (KLONOPIN) 0.5 MG tablet Place 1 tablet (0.5 mg total) into feeding tube 2 (two) times daily. 03/22/23  Yes Lurene Shadow, MD  Cyanocobalamin (VITAMIN B-12) 5000 MCG TBDP Place 10,000 mcg into feeding tube daily. 03/22/23  Yes Lurene Shadow, MD  diltiazem (CARDIZEM CD) 300 MG 24 hr capsule Take 300 mg by mouth daily.   Yes [provider]  diphenoxylate-atropine (LOMOTIL) 2.5-0.025 MG tablet Place 1 tablet into feeding tube 4 (four) times daily. 05/03/23  Yes Wouk, Wilfred Curtis, MD  doxycycline (VIBRAMYCIN) 100 MG capsule Take 100 mg by mouth 2 (two) times daily. 06/28/23  Yes [provider]  ezetimibe (ZETIA) 10 MG tablet Place 1 tablet (10 mg total) into feeding tube at bedtime. 03/22/23  Yes Lurene Shadow, MD  ferrous sulfate 300 (60 Fe) MG/5ML syrup Place 5 mLs (300 mg total) into feeding tube daily. Patient taking differently: Place 6.8 mLs into feeding tube daily. 03/22/23  Yes Lurene Shadow, MD  finasteride (PROSCAR) 5 MG tablet Take 1 tablet by mouth daily. 02/25/23  Yes [provider]  furosemide (LASIX) 40 MG tablet Take 40 mg by mouth daily.   Yes [provider]  gabapentin (NEURONTIN) 300 MG capsule Take 1 capsule by mouth at bedtime. 02/14/23  Yes [provider]  HUMALOG 100 UNIT/ML injection Inject 0-14 Units into the skin 3 (three) times daily with meals. Sliding scale 05/27/23  Yes [provider]  Infant Care Products [facility]) OINT Apply  1 Application topically daily as needed.   Yes [provider]  insulin glargine (LANTUS) 100 UNIT/ML injection Inject 40 Units into the skin 2 (two) times daily. 03/27/23  Yes [provider]  isosorbide mononitrate (IMDUR) 60 MG 24 hr tablet Take 60 mg by mouth in the morning and at bedtime.   Yes [provider]  levothyroxine (SYNTHROID) 25 MCG tablet Place 1 tablet (25 mcg total) into feeding tube daily at 6 (six) AM. 03/22/23  Yes Lurene Shadow, MD  loperamide HCl (IMODIUM) 1 MG/7.5ML suspension Place 15 mLs (2 mg total) into feeding tube every 6 (six) hours as needed for diarrhea or loose stools. 05/03/23  Yes Wouk, Wilfred Curtis, MD  losartan (COZAAR) 50 MG tablet Take 50 mg by mouth daily.   Yes [provider]  metoprolol tartrate (LOPRESSOR) 25 MG tablet Take 0.5 tablets (12.5 mg total) by mouth 2 (two) times daily. 05/03/23  Yes Wouk, Wilfred Curtis, MD  Multiple Vitamins-Minerals (MULTIVITAMIN WITH MINERALS) tablet Take 1 tablet by mouth daily.   Yes [provider]  nitrofurantoin, macrocrystal-monohydrate, (MACROBID) 100 MG capsule Take 100 mg by mouth daily.   Yes [provider]  nitroGLYCERIN (NITROSTAT) 0.4 MG SL tablet Place 1 tablet (0.4 mg total) under the tongue every 5 (five) minutes as needed for chest pain. 06/20/22  Yes [name], MD  nystatin (MYCOSTATIN/NYSTOP) powder Apply 1 Application  topically 2 (two) times daily. Under belly   Yes [provider]  ondansetron (ZOFRAN) 4 MG tablet Place 1 tablet (4 mg total) into feeding tube every 8 (eight) hours as needed for nausea. 05/28/23  Yes [name], MD  pramipexole (MIRAPEX) 1 MG tablet Place 2 tablets (2 mg total) into feeding tube at bedtime. 03/22/23  Yes Lurene Shadow, MD  primidone (MYSOLINE) 250 MG tablet Place 1 tablet (250 mg total) into feeding tube 2 (two) times daily. 03/22/23  Yes Lurene Shadow, MD  tamsulosin (FLOMAX) 0.4 MG CAPS capsule  Take 0.4 mg by mouth.   Yes [provider]  Vitamins A & D (VITAMIN A & D) ointment Apply 1 Application topically as needed for dry skin. Apply to stumps   Yes [provider]  zinc sulfate, 50mg  elemental zinc, 220 (50 Zn) MG capsule Take 220 mg by mouth daily.   Yes [provider]  fiber supplement, BANATROL TF, liquid Place 60 mLs into feeding tube 2 (two) times daily. 05/03/23   Wouk, Wilfred Curtis, MD  lidocaine-prilocaine (EMLA) cream Apply 1 Application topically as needed. 05/28/23   [name], MD  Water For Irrigation, Sterile (FREE WATER) SOLN Place 30 mLs into feeding tube 6 (six) times daily. 05/03/23   Wouk, Wilfred Curtis, MD     Critical care time: 35 minutes      Raechel Chute, MD [place] 07/02/2023 12:12 PM

## 2023-07-02 NOTE — Progress Notes (Signed)
 PHARMACY - ANTICOAGULATION CONSULT NOTE  Pharmacy Consult for heparin infusion Indication: atrial fibrillation  Allergies  Allergen Reactions   Penicillins Other (See Comments)    Happened at 68 years old and pt. stated he passed out  He has tolerated amoxicillin/clavulanate and ampicillin/sulbactam   Statins     Other reaction(s): Muscle Pain Causes legs to ache per pt   Metformin And Related Diarrhea    Patient Measurements: Height: 5\' 5"  (165.1 cm) Weight: 120.6 kg (265 lb 14 oz) IBW/kg (Calculated) : 61.5 Heparin Dosing Weight: 90.8 kg  Vital Signs: Temp: 99.5 F (37.5 C) (03/04 0400) Temp Source: Bladder (03/03 1800) BP: 105/66 (03/04 0400) Pulse Rate: 96 (03/04 0400)  Labs: Recent Labs    06/29/23 1142 06/29/23 1240 06/29/23 1824 06/30/23 0534 06/30/23 1147 06/30/23 1948 06/30/23 2247 07/01/23 0222 07/02/23 0354  HGB 9.7*  --   --  9.0*  --   --   --  9.1* 9.4*  HCT 31.2*  --   --  28.2*  --   --   --  29.3* 30.0*  PLT 203  --   --  195  --   --   --  188 177  APTT  --  40*  --   --   --  77*  --  86*  --   LABPROT  --  16.2*  --   --   --   --   --   --   --   INR  --  1.3*  --   --   --   --   --   --   --   HEPARINUNFRC  --   --   --   --   --   --   --  0.55 0.23*  CREATININE 1.39*  --    < > 1.19   < >  --  1.15 1.11 1.13  TROPONINIHS 11  --   --   --   --   --   --   --   --    < > = values in this interval not displayed.    Estimated Creatinine Clearance: 75.3 mL/min (by C-G formula based on SCr of 1.13 mg/dL).   Medical History: Past Medical History:  Diagnosis Date   Arrhythmia    atrial fibrillation   Asthma    CHF (congestive heart failure) (HCC)    COPD (chronic obstructive pulmonary disease) (HCC)    Coronary artery disease    Depression    Diabetes mellitus without complication (HCC)    Gout    History anabolic steroid use    Hyperlipidemia    Hypertension    Hypogonadism in male    MI (myocardial infarction) (HCC)    Morbid  obesity (HCC)    Myocardial infarction (HCC)    Peripheral vascular disease (HCC)    Perirectal abscess    Pleurisy    Sleep apnea    CPAP at night, no oxygen   Varicella     Medications:  Scheduled:   Chlorhexidine Gluconate Cloth  6 each Topical Daily   famotidine  20 mg Per Tube BID   feeding supplement (PROSource TF20)  60 mL Per Tube BID   feeding supplement (VITAL HIGH PROTEIN)  1,000 mL Per Tube Q24H   free water  30 mL Per Tube Q4H   insulin aspart  0-15 Units Subcutaneous Q4H   levothyroxine  25 mcg Per Tube Q0600   mupirocin  ointment  1 Application Nasal BID   mouth rinse  15 mL Mouth Rinse Q2H   sodium zirconium cyclosilicate  10 g Per Tube BID    Assessment: 68 y.o. male presenting with unresponsive and resp failure. PMH significant for CHF, COPD, CAD, diabetes, PVD, bilateral below the knee amputations. He takes apixaban PTA with last dose 06/28/23 pm  Date Time aPTT/HL Rate/Comment  3/2 1948 aPTT 77 Therapeutic x1 3/3       0222   aPTT 86          Therapeutic X 2 3/4 0354 HL 0.23 Subtherapeutic  Goal of Therapy:  anti-Xa level  0.3-0.7 units/ml aPTT 66 - 102 seconds Monitor platelets by anticoagulation protocol: Yes   Plan:  -Bolus 1400 units x 1 -Increase heparin infusion to 1700 units/hr - Will recheck HL in 6 hrs after rate change  - CBC daily  Continue to monitor H&H and platelets  Thank you for involving pharmacy in this patient's care.   Otelia Sergeant, PharmD, MBA 07/02/2023 6:00 AM

## 2023-07-02 NOTE — Progress Notes (Signed)
 pT. EXTUBATED TO BIPAP 02/01/09/35%. PT. IS TOLERATING BIPAP WELL.

## 2023-07-02 NOTE — Progress Notes (Signed)
 Nutrition Follow-up  DOCUMENTATION CODES:   Morbid obesity  INTERVENTION:   Change to Nepro @60ml /hr- Initiate at 18ml/hr, once tolerating, increase by 37ml/hr q 8 hours until goal rate is reached.  Free water flushes 30ml q4 hours to maintain tube patency   Regimen provides 2592kcal/day, 117g/day protein and 1242ml/day of free water.   Pt remains at high refeed risk; recommend monitor potassium, magnesium and phosphorus labs daily until stable  Daily weights    NUTRITION DIAGNOSIS:   Inadequate oral intake related to inability to eat as evidenced by NPO status. -ongoing   GOAL:   Patient will meet greater than or equal to 90% of their needs -not met   MONITOR:   Diet advancement, Labs, Weight trends, TF tolerance, I & O's, Skin  ASSESSMENT:   68 y/o male with h/o HTN, CHF, Afib s/p cardioversion 10/23 & 12/24, DM, CAD s/p CABG, PAD, OSA, BPH, gout, depression and COPD who is admitted with acute on chronic hypoxic respiratory failure secondary to acute diastolic CHF exacerbation, pneumonia, MRSA bacteremia, L heel osteomyelitis s/p BKA 6/6, UTI and right-sided pleural effusion s/p right sided thoracentesis w/ removal of 1.2L of pleural fluid 10/25/21, s/p R BKA 11/24, C. diff, R right mass and esophageal mass s/p chemoradiation and resulting dysphagia requiring surgical G-tube placement (100F) 03/18/23 and who is now admitted with acute respiratory failure, AKI, hyperkalemia, elevated BNP and rhinovirus.  Pt extubated today. Tube feeds held for extubation but have been restarted at trickle rate. Per chart review, pt has had five recent admissions with severe hyperkalemia. Pt's home tube feed formula of Glucerna contains large amounts of potassium; RD will change pt over to Nepro. Nepro is a diabetic formula that contains half of the potassium of Glucerna. Pt remains at high refeed risk. No BM since admission; bowel regimen initiated. Per chart, pt is down ~6lbs since admission. Pt  -1.2L on his I & Os.     Medications reviewed and include: colace, insulin, synthroid, miralax, lokelma, zosyn, vancomycin    Labs reviewed: K 5.4(H), P 4.5 wnl, Mg 1.9 wnl  Hgb 9.4(L), Hct 30.0(L) cbgs- 126, 128, 148, 172 x 24 hrs  AIC 7.8(H)- 03/2023  UOP-   Diet Order:   Diet Order             Diet NPO time specified  Diet effective now                  EDUCATION NEEDS:   Not appropriate for education at this time  Skin:  Skin Assessment: Reviewed RN Assessment (skin tears)  Last BM:  pta  Height:   Ht Readings from Last 1 Encounters:  07/02/23 6\' 4"  (1.93 m)    Weight:   Wt Readings from Last 1 Encounters:  07/02/23 120.6 kg    Ideal Body Weight:  75.2 kg (adjusted for bilateral AKA)  BMI:  Body mass index is 32.36 kg/m.  Estimated Nutritional Needs:   Kcal:  2300-2600kcal/day  Protein:  115-130g/day  Fluid:  2.0L/day  Betsey Holiday MS, RD, LDN If unable to be reached, please send secure chat to "RD inpatient" available from 8:00a-4:00p daily

## 2023-07-02 NOTE — Plan of Care (Signed)
  Problem: Clinical Measurements: Goal: Respiratory complications will improve Outcome: Progressing   Problem: Nutrition: Goal: Adequate nutrition will be maintained Outcome: Progressing   Problem: Education: Goal: Knowledge of General Education information will improve Description: Including pain rating scale, medication(s)/side effects and non-pharmacologic comfort measures Outcome: Not Progressing   Problem: Health Behavior/Discharge Planning: Goal: Ability to manage health-related needs will improve Outcome: Not Progressing   Problem: Clinical Measurements: Goal: Ability to maintain clinical measurements within normal limits will improve Outcome: Not Progressing Goal: Will remain free from infection Outcome: Not Progressing Goal: Diagnostic test results will improve Outcome: Not Progressing Goal: Cardiovascular complication will be avoided Outcome: Not Progressing   Problem: Activity: Goal: Risk for activity intolerance will decrease Outcome: Not Progressing   Problem: Coping: Goal: Level of anxiety will decrease Outcome: Not Progressing   Problem: Elimination: Goal: Will not experience complications related to bowel motility Outcome: Not Progressing Goal: Will not experience complications related to urinary retention Outcome: Not Progressing   Problem: Pain Managment: Goal: General experience of comfort will improve and/or be controlled Outcome: Not Progressing   Problem: Safety: Goal: Ability to remain free from injury will improve Outcome: Not Progressing   Problem: Skin Integrity: Goal: Risk for impaired skin integrity will decrease Outcome: Not Progressing

## 2023-07-02 NOTE — Progress Notes (Addendum)
 Heparin gtt stopped due to port being occluded. After IV team able to gain access to port again, patient unwilling to let RN  reconnect heparin gtt back to port. ICU NP made aware, will continue to monitor

## 2023-07-03 ENCOUNTER — Ambulatory Visit: Payer: Medicare Other

## 2023-07-03 ENCOUNTER — Ambulatory Visit

## 2023-07-03 DIAGNOSIS — R918 Other nonspecific abnormal finding of lung field: Secondary | ICD-10-CM | POA: Diagnosis not present

## 2023-07-03 DIAGNOSIS — J189 Pneumonia, unspecified organism: Secondary | ICD-10-CM

## 2023-07-03 DIAGNOSIS — G9340 Encephalopathy, unspecified: Secondary | ICD-10-CM

## 2023-07-03 DIAGNOSIS — B348 Other viral infections of unspecified site: Secondary | ICD-10-CM

## 2023-07-03 DIAGNOSIS — I4892 Unspecified atrial flutter: Secondary | ICD-10-CM

## 2023-07-03 DIAGNOSIS — I959 Hypotension, unspecified: Secondary | ICD-10-CM

## 2023-07-03 LAB — GLUCOSE, CAPILLARY
Glucose-Capillary: 128 mg/dL — ABNORMAL HIGH (ref 70–99)
Glucose-Capillary: 141 mg/dL — ABNORMAL HIGH (ref 70–99)
Glucose-Capillary: 128 mg/dL — ABNORMAL HIGH (ref 70–99)
Glucose-Capillary: 170 mg/dL — ABNORMAL HIGH (ref 70–99)
Glucose-Capillary: 192 mg/dL — ABNORMAL HIGH (ref 70–99)
Glucose-Capillary: 175 mg/dL — ABNORMAL HIGH (ref 70–99)

## 2023-07-03 LAB — BASIC METABOLIC PANEL
Anion gap: 11 (ref 5–15)
BUN: 27 mg/dL — ABNORMAL HIGH (ref 8–23)
CO2: 24 mmol/L (ref 22–32)
Calcium: 8.6 mg/dL — ABNORMAL LOW (ref 8.9–10.3)
Chloride: 103 mmol/L (ref 98–111)
Creatinine, Ser: 1.07 mg/dL (ref 0.61–1.24)
GFR, Estimated: >60 mL/min (ref >60)
Glucose, Bld: 147 mg/dL — ABNORMAL HIGH (ref 70–99)
Potassium: 3.8 mmol/L (ref 3.5–5.1)
Sodium: 138 mmol/L (ref 135–145)

## 2023-07-03 LAB — CBC
HCT: 28 % — ABNORMAL LOW (ref 39.0–52.0)
Hemoglobin: 8.8 g/dL — ABNORMAL LOW (ref 13.0–17.0)
MCH: 30.9 pg (ref 26.0–34.0)
MCHC: 31.4 g/dL (ref 30.0–36.0)
MCV: 98.2 fL (ref 80.0–100.0)
Platelets: 170 10*3/uL (ref 150–400)
RBC: 2.85 MIL/uL — ABNORMAL LOW (ref 4.22–5.81)
RDW: 17.7 % — ABNORMAL HIGH (ref 11.5–15.5)
WBC: 6.6 10*3/uL (ref 4.0–10.5)
nRBC: 0 % (ref 0.0–0.2)

## 2023-07-03 LAB — PHOSPHORUS: Phosphorus: 3.1 mg/dL (ref 2.5–4.6)

## 2023-07-03 LAB — PROCALCITONIN: Procalcitonin: 0.27 ng/mL

## 2023-07-03 LAB — MAGNESIUM: Magnesium: 1.8 mg/dL (ref 1.7–2.4)

## 2023-07-03 LAB — TROPONIN I (HIGH SENSITIVITY)
Troponin I (High Sensitivity): 15 ng/L (ref <18)
Troponin I (High Sensitivity): 19 ng/L — ABNORMAL HIGH (ref <18)

## 2023-07-03 LAB — TSH: TSH: 5.028 u[IU]/mL — ABNORMAL HIGH (ref 0.350–4.500)

## 2023-07-03 MED ORDER — MAGNESIUM SULFATE 2 GM/50ML IV SOLN
2.0000 g | Freq: Once | INTRAVENOUS | Status: AC
  Administered 2023-07-03: 2 g via INTRAVENOUS
  Filled 2023-07-03: qty 50

## 2023-07-03 MED ORDER — VANCOMYCIN HCL IN DEXTROSE 1-5 GM/200ML-% IV SOLN
1000.0000 mg | Freq: Two times a day (BID) | INTRAVENOUS | Status: DC
  Administered 2023-07-03 – 2023-07-05 (×4): 1000 mg via INTRAVENOUS
  Filled 2023-07-03 (×7): qty 200

## 2023-07-03 MED ORDER — AMIODARONE HCL IN DEXTROSE 360-4.14 MG/200ML-% IV SOLN
60.0000 mg/h | INTRAVENOUS | Status: DC
  Administered 2023-07-03 – 2023-07-04 (×3): 60 mg/h via INTRAVENOUS
  Filled 2023-07-03 (×3): qty 200

## 2023-07-03 MED ORDER — POTASSIUM CHLORIDE 20 MEQ PO PACK
20.0000 meq | PACK | Freq: Once | ORAL | Status: AC
  Administered 2023-07-03: 20 meq
  Filled 2023-07-03: qty 1

## 2023-07-03 MED ORDER — PHENOL 1.4 % MT LIQD
1.0000 | OROMUCOSAL | Status: DC | PRN
  Administered 2023-07-03: 1 via OROMUCOSAL
  Filled 2023-07-03: qty 177

## 2023-07-03 MED ORDER — AMIODARONE HCL IN DEXTROSE 360-4.14 MG/200ML-% IV SOLN
60.0000 mg/h | INTRAVENOUS | Status: AC
  Administered 2023-07-03 (×2): 60 mg/h via INTRAVENOUS
  Filled 2023-07-03: qty 200

## 2023-07-03 MED ORDER — AMIODARONE IV BOLUS ONLY 150 MG/100ML
150.0000 mg | Freq: Once | INTRAVENOUS | Status: AC
  Administered 2023-07-03: 150 mg via INTRAVENOUS

## 2023-07-03 MED ORDER — AMIODARONE HCL IN DEXTROSE 360-4.14 MG/200ML-% IV SOLN
INTRAVENOUS | Status: AC
  Filled 2023-07-03: qty 200

## 2023-07-03 MED ORDER — AMIODARONE IV BOLUS ONLY 150 MG/100ML
INTRAVENOUS | Status: AC
  Filled 2023-07-03: qty 100

## 2023-07-03 MED ORDER — DOCUSATE SODIUM 50 MG/5ML PO LIQD: 100.0000 mg | Freq: Two times a day (BID) | ORAL | Status: DC | PRN

## 2023-07-03 MED ORDER — AMIODARONE HCL IN DEXTROSE 360-4.14 MG/200ML-% IV SOLN
30.0000 mg/h | INTRAVENOUS | Status: DC
Start: 2023-07-03 — End: 2023-07-03

## 2023-07-03 MED ORDER — MIDAZOLAM HCL 2 MG/2ML IJ SOLN
INTRAMUSCULAR | Status: AC
  Filled 2023-07-03: qty 2

## 2023-07-03 MED ORDER — MIDAZOLAM HCL 2 MG/2ML IJ SOLN
1.0000 mg | Freq: Once | INTRAMUSCULAR | Status: AC
  Administered 2023-07-03: 1 mg via INTRAVENOUS

## 2023-07-03 MED ORDER — POLYETHYLENE GLYCOL 3350 17 G PO PACK: 17.0000 g | PACK | Freq: Every day | ORAL | Status: DC | PRN

## 2023-07-03 MED ORDER — MIDODRINE HCL 5 MG PO TABS
5.0000 mg | ORAL_TABLET | Freq: Three times a day (TID) | ORAL | Status: DC
  Administered 2023-07-03 – 2023-07-04 (×3): 5 mg via ORAL
  Filled 2023-07-03 (×3): qty 1

## 2023-07-03 NOTE — Progress Notes (Addendum)
 Brief Note:  Presented to the patient's bedside this AM alongside NP Nelson secondary to tachycardia, with ventricular rate of 170-180. Patient known to me, previously on PCCM service and transferred to [facility] overnight.   Patient was asymptomatic, and while blood pressure was soft, MAP was maintained > 65 mmHg. Reviewed labs, no electrolyte abnormalities. Also reviewed rhythm that appeared wide complex. Initial concern for ventricular tachycardia and patient was shocked (Synchronized, 120 then 150 then 200J ). Patient's previous EKG's reviewed, noted to have right bundle branch block with fascicular block. Current EKG with ventricular rate of 179, wide complex rhythm (similar morphology to prior). Differential considerations include AVNRT vs afib/flutter with aberrancy, unlikely to be ventricular tachycardia. Patient also administered amiodarone (150 mg x 3 followed by a drip). Rate improved to 140-150.  Update: NP Delton See called patient's wife and sister in law and updated on condition. Attending of record Dr. Denton Lank also updated.  Raechel Chute, MD [facility] 07/03/2023 7:55 AM

## 2023-07-03 NOTE — Progress Notes (Addendum)
 Progress Note   Patient: [name]. OHY:[id number] DOB: [birth year] DOA: 06/29/2023     4 DOS: the patient was seen and examined on 07/03/2023   Brief hospital course:  HPI on admission to ICU on 06/29/2023: "68 year old male with a history of CAD, Afib, CHF, esophageal cancer (s/p chemoradiation), PEG tube, DM, obesity, OSA and bilateral above knee amputations who presented to the hospital on 3/1 with respiratory failure, requiring intubation on arrival. He was recently discharged from the hospital on 2/16 after brief admission with chest discomfort.   Given presentation with respiratory distress, the patient was intubated by the ED provider on arrival. He was very hypoxic and unresponsive per family and EMS. He was found by wife after likely fall, with EMS needing to bag him secondary to hypoxia.  Blood work on presentation to the ED was notable for AKI, hyperkalemia, elevated BNP, and rhinovirus on his respiratory viral panel. He is admitted to the ICU for management of respiratory failure."  See ICU progress notes of 3/1--3/4 for detailed hospital events since admission.  Pt was extubated to BiPAP on 3/4.  TRH assumed care on 07/03/2023. Further hospital course and management as outlined below.   Assessment and Plan:  Acute Respiratory Failure with Hypoxia Rhinovirus infection Bacterial versus Aspiration Pneumonia - +MRSA screen Acute on Chronic HFpEF - treated with IV Lasix by PCCM Net IO Since Admission: -2,263.78 mL [07/03/23 1840] 3/5 - remains on 2 L/min [place] O2 --Wean O2 as tolerated, goal spO2 > 90% --Continue IV Vanc/Zosyn to complete total 7 day course --Pulmonary hygiene --Diuresis as needed and as BP will tolerate  A-fib/flutter with RVR Wide complex tachycardia 3/5 AM -- per PCCM, wide complex tachycardia requiring defibrillation x 3, given IV amio bolus and continue on amio gtt.  See PCCM progerss note. --Cardiology consulted  --History of difficult to control  HR"s and poor candidate for TEE due to high risk  --Continue amiodarone gtt --Heparin gtt >> Elquis 5 mg BID --Maintain K>4, Mg>2 --Telemetry  Hypotension - likely related to HR's and infection, multifactorial. --On midodrine 5 mg TID --Maintain MAP>65  Acute Kidney Injury - improving --Monitor BMP --Avoid nephrotoxins --Maintain MAP>65 for adequate renal perfusion  History of Esophageal Cancer - on radiation therapy, s/p chemo --Follow up as scheduled with Oncology and Rad/Onc after d/c      Subjective: Pt seen in stepdown with wife at bedside this AM.  He reports feeling okay, weak.  Events of the morning were very difficult and scary for both pt and wife.  He denies acute complaints for me.   Physical Exam: Vitals:   07/03/23 1530 07/03/23 1600 07/03/23 1630 07/03/23 1634  BP: 111/66 107/63 103/62   Pulse: 84 81 81 81  Resp: 17 20 20 18   Temp: 98.8 F (37.1 C) 98.6 F (37 C) 98.6 F (37 C) 98.6 F (37 C)  TempSrc:      SpO2: 96% 96% 97% 96%  Weight:      Height:       General exam: awake, alert, no acute distress HEENT: atraumatic, clear conjunctiva, anicteric sclera, moist mucus membranes, hearing grossly normal  Respiratory system: lungs clear but diminished, no wheezes, normal respiratory effort. On 2 L/min [place] o2 Cardiovascular system: normal S1/S2, RRR Gastrointestinal system: soft, NT, ND, PEG tube present Central nervous system: A&O x3. no gross focal neurologic deficits, normal speech Extremities: B/L AKA's, no edema, normal tone Skin: dry, intact, normal temperature Psychiatry: normal mood, congruent affect, judgement  and insight appear normal   Data Reviewed:  Notable labs --   BMP normal except glucose 147, Ca 8.6 Procal 0.27 CBC with Hbg 9.4 >> 8.8, normal WBC 6.6  TSH 5.028  CBG's at goal   Family Communication: wife at bedside on rounds  Disposition: Status is: Inpatient Remains inpatient appropriate because: severity of illness as  above, on IV therapies   Planned Discharge Destination: Skilled nursing facility    Time spent: 52 minutes  Author: Pennie Banter, DO 07/03/2023 6:29 PM  For on call review [email or web address].

## 2023-07-03 NOTE — Progress Notes (Signed)
 This nurse at bedside for first turn with [name] and patient converted into wide complex tachycardia.  ACLS protocol initiated with Zada Girt, NP in charge.  See code blue sheet for details. Patient remained alert during cardiac event.  Wife [name] notified.

## 2023-07-03 NOTE — Consult Note (Addendum)
 PHARMACY CONSULT NOTE - ELECTROLYTES  Pharmacy Consult for Electrolyte Monitoring and Replacement   Recent Labs: Height: 6\' 4"  (193 cm) Weight: 120.6 kg (265 lb 14 oz) IBW/kg (Calculated) : 86.8 Estimated Creatinine Clearance: 93.7 mL/min (by C-G formula based on SCr of 1.07 mg/dL). Potassium (mmol/L)  Date Value  07/03/2023 3.8  07/12/2011 4.4   Magnesium (mg/dL)  Date Value  40/98/1191 1.8   Calcium (mg/dL)  Date Value  47/82/9562 8.6 (L)   Calcium, Total (mg/dL)  Date Value  13/11/6576 8.8   Albumin (g/dL)  Date Value  46/96/2952 2.8 (L)   Phosphorus (mg/dL)  Date Value  84/13/2440 3.1   Sodium (mmol/L)  Date Value  07/03/2023 138  07/12/2011 141   Assessment  Alec A Moragne Montez Hageman. is a 68 y.o. male presenting with unresponsive and resp failure. PMH significant for CHF, COPD, CAD, diabetes, PVD, bilateral below the knee amputations . Pharmacy has been consulted to monitor and replace electrolytes.  Goal of Therapy:  Potassium 4.0 - 5.1 mmol/L Magnesium 2.0 - 2.4 mg/dL All Other Electrolytes WNL  Plan:  Discontinue sodium zirconium cyclosilicate 10 grams per tube BID as hyperkalemia resolved today 2 grams IV magnesium sulfate x 1 20 mEq KCl per tube x 1 Continue to monitor electrolytes with AM labs   Thank you for involving pharmacy in this patient's care.   Burnis Medin, PharmD, BCPS  07/03/2023 6:49 AM

## 2023-07-03 NOTE — Consult Note (Signed)
 Cardiology Consultation   Patient ID: [name]. MRN: [id number]; DOB: 05-03-1955  Admit date: 06/29/2023 Date of Consult: 07/03/2023  PCP:  [name], MD   [facility] Providers Cardiologist:  Lorine Bears, MD  Electrophysiologist:  Nobie Putnam, MD       Patient Profile:   [name]. is a 68 y.o. male with a hx of CAD s/p CABG x2 2006, DES to RCA 2021, paroxysmal atrial fibrillation/flutter, chronic diastolic heart failure LVEF 55%, PAD, bilateral BKA, hypertension, hyperlipidemia, COPD, OSA, esophageal cancer, T2DM, hypothyroidism, and frequent falls who is being seen 07/03/2023 for the evaluation of wide complex tachycardia at the request of Dr. Denton Lank.  History of Present Illness:   Mr. Bacallao follows with Dr. Kirke Corin for the above cardiac conditions. He presented to the [facility] ED 03/27/2023 with profuse diarrhea.  He was admitted with dehydration and hyperkalemia in the setting of diarrhea from C. difficile.  Cardiology was consulted on 04/04/2023 for management of atrial flutter with declining blood pressure.  EKG showed atrial flutter with 2-1 AV block. It was felt a flutter was being driven by physiological stress in the setting of C. difficile and dehydration. Initial plan included transitioning patient to IV amiodarone for rate control and pharmacologic cardioversion but patient at high risk for stroke secondary to interruption of anticoagulation. Not a candidate for TEE secondary to obstructing esophageal tumor. Patient experienced delirium in the hospital admission pulling off his monitor and disconnecting IV. Continued difficulty with rate control. IV digoxin x 2 given with no significant improvement in rate. Concern for GI absorption of medication secondary to continued diarrhea. Patient was restarted on p.o. Amio 12/16 in preparation for cardioversion on 12/23.  Cardioversion on 12/23 was canceled in the setting of increased confusion. He  returned to baseline mental status following day and underwent successful cardioversion. His hospital course was further complicated by urinary retention. Patient was discharged on 05/30/2023 to [facility].  He presented to the ED again on 05/22/2023 for central chest pain.  EKG showed sinus rhythm with no acute changes.  Labs are unremarkable including negative troponin x 2.  He was discharged the next day.  Last seen in our office 06/03/2023 and was overall doing well from a cardiac standpoint.  He remained in sinus rhythm with right bundle branch block and left anterior fascicular block. No medication changes were made. He was referred to EP for his afib/flutter.  History obtained from chart review as patient is poor historian, no family at the bedside. Patient reportedly had several days of epigastric discomfort with discharge around PEG tube site.  He was evaluated by GI provider who explained this may not need additional treatment. However patient saw primary care provider and obtained doxycycline. On 3/1, patient reportedly had increased weakness and mechanical fall, wife found him face down in the living room. He had respiratory distress in the field and was noted to be hypoxemic and required bagging by EMS. In the emergency department breathing became progressively worse and required intubation. Chest x-ray with right lower lobe infiltrate and patient is being treated for pneumonia empirically. Initial labs with findings of acute kidney injury with hyperkalemia and elevation of BNP at 556. Initial EKG showed accelerated junctional rhythm with right bundle branch block. Patient was diuresed with furosemide, extubated to BiPAP on 3/4. On 3/5 CCM team called to the bedside patient and wide-complex tachycardia with rate 170 to 180 bpm. Initial concern for ventricular tachycardia and patient  was shocked (synchronized, 120 then 150 then 200 J). Patient was also administered amiodarone (150 mg x 3 followed  by drip).  Cardiology consulted for further rate control.  Past Medical History:  Diagnosis Date   Arrhythmia    atrial fibrillation   Asthma    CHF (congestive heart failure) (HCC)    COPD (chronic obstructive pulmonary disease) (HCC)    Coronary artery disease    Depression    Diabetes mellitus without complication (HCC)    Gout    History anabolic steroid use    Hyperlipidemia    Hypertension    Hypogonadism in male    MI (myocardial infarction) (HCC)    Morbid obesity (HCC)    Myocardial infarction (HCC)    Peripheral vascular disease (HCC)    Perirectal abscess    Pleurisy    Sleep apnea    CPAP at night, no oxygen   Varicella     Past Surgical History:  Procedure Laterality Date   ABDOMINAL AORTIC ANEURYSM REPAIR     ACHILLES TENDON SURGERY Left 01/10/2021   Procedure: ACHILLES LENGTHENING/KIDNER;  Surgeon: Rosetta Posner, DPM;  Location: [facility] ORS;  Service: Podiatry;  Laterality: Left;   AMPUTATION Left 10/03/2021   Procedure: AMPUTATION BELOW KNEE;  Surgeon: Renford Dills, MD;  Location: [facility];  Service: Vascular;  Laterality: Left;   AMPUTATION Right 05/24/2022   Procedure: AMPUTATION BELOW KNEE;  Surgeon: Annice Needy, MD;  Location: [facility];  Service: General;  Laterality: Right;   AMPUTATION TOE Right 02/10/2016   Procedure: AMPUTATION TOE 3RD TOE;  Surgeon: Gwyneth Revels, DPM;  Location: [facility];  Service: Podiatry;  Laterality: Right;   AMPUTATION TOE Left 02/24/2020   Procedure: AMPUTATION TOE;  Surgeon: Rosetta Posner, DPM;  Location: [facility];  Service: Podiatry;  Laterality: Left;   APPLICATION OF WOUND VAC Left 02/29/2020   Procedure: APPLICATION OF WOUND VAC;  Surgeon: Rosetta Posner, DPM;  Location: [facility];  Service: Podiatry;  Laterality: Left;   BIOPSY  03/15/2023   Procedure: BIOPSY;  Surgeon: Wyline Mood, MD;  Location: [facility] ENDOSCOPY;  Service: Gastroenterology;;   BIOPSY  04/10/2023   Procedure: BIOPSY;  Surgeon: Norma Fredrickson, Boykin Nearing, MD;   Location: [facility];  Service: Gastroenterology;;   CARDIAC CATHETERIZATION     CARDIOVERSION [name] 02/13/2022   Procedure: CARDIOVERSION;  Surgeon: Lamar Blinks, MD;  Location: [facility];  Service: Cardiovascular;  Laterality: [name];   CARDIOVERSION [name] 04/23/2023   Procedure: CARDIOVERSION;  Surgeon: Antonieta Iba, MD;  Location: [facility];  Service: Cardiovascular;  Laterality: [name];   COLONOSCOPY WITH PROPOFOL [name] 11/18/2015   Procedure: COLONOSCOPY WITH PROPOFOL;  Surgeon: Scot Jun, MD;  Location: [facility] ENDOSCOPY;  Service: Endoscopy;  Laterality: [name];   CORONARY ARTERY BYPASS GRAFT     CORONARY STENT INTERVENTION [name] 02/02/2020   Procedure: CORONARY STENT INTERVENTION;  Surgeon: Marcina Millard, MD;  Location: [facility];  Service: Cardiovascular;  Laterality: [name];   ESOPHAGOGASTRODUODENOSCOPY (EGD) WITH PROPOFOL [name] 03/15/2023   Procedure: ESOPHAGOGASTRODUODENOSCOPY (EGD) WITH PROPOFOL;  Surgeon: Wyline Mood, MD;  Location: [facility] ENDOSCOPY;  Service: Gastroenterology;  Laterality: [name];   FLEXIBLE SIGMOIDOSCOPY [name] 04/10/2023   Procedure: FLEXIBLE SIGMOIDOSCOPY;  Surgeon: Toledo, Boykin Nearing, MD;  Location: [facility] ENDOSCOPY;  Service: Gastroenterology;  Laterality: [name];   GASTROSTOMY [name] 03/18/2023   Procedure: INSERTION OF GASTROSTOMY TUBE;  Surgeon: Carolan Shiver, MD;  Location: [facility];  Service: General;  Laterality: [name];   IMPACTION REMOVAL  03/15/2023  Procedure: IMPACTION REMOVAL;  Surgeon: Wyline Mood, MD;  Location: [facility] ENDOSCOPY;  Service: Gastroenterology;;   INCISION AND DRAINAGE Left 08/07/2021   Procedure: INCISION AND DRAINAGE-Partial Calcanectomy;  Surgeon: Rosetta Posner, DPM;  Location: [facility];  Service: Podiatry;  Laterality: Left;   IRRIGATION AND DEBRIDEMENT FOOT Left 02/29/2020   Procedure: IRRIGATION AND DEBRIDEMENT FOOT;  Surgeon: Rosetta Posner, DPM;  Location: [facility];  Service: Podiatry;  Laterality: Left;   IRRIGATION AND  DEBRIDEMENT FOOT Left 02/24/2020   Procedure: IRRIGATION AND DEBRIDEMENT FOOT;  Surgeon: Rosetta Posner, DPM;  Location: [facility];  Service: Podiatry;  Laterality: Left;   KNEE ARTHROSCOPY     LEFT HEART CATH AND CORS/GRAFTS ANGIOGRAPHY [name] 02/02/2020   Procedure: LEFT HEART CATH AND CORS/GRAFTS ANGIOGRAPHY;  Surgeon: Dalia Heading, MD;  Location: [facility];  Service: Cardiovascular;  Laterality: [name];   LOWER EXTREMITY ANGIOGRAPHY Left 02/25/2020   Procedure: Lower Extremity Angiography;  Surgeon: Annice Needy, MD;  Location: [facility];  Service: Cardiovascular;  Laterality: Left;   LOWER EXTREMITY ANGIOGRAPHY Left 01/04/2021   Procedure: LOWER EXTREMITY ANGIOGRAPHY;  Surgeon: Annice Needy, MD;  Location: [facility];  Service: Cardiovascular;  Laterality: Left;   LOWER EXTREMITY ANGIOGRAPHY Right 05/21/2022   Procedure: Lower Extremity Angiography;  Surgeon: Annice Needy, MD;  Location: [facility];  Service: Cardiovascular;  Laterality: Right;   METATARSAL HEAD EXCISION Left 01/10/2021   Procedure: METATARSAL HEAD EXCISION - LEFT 5th;  Surgeon: Rosetta Posner, DPM;  Location: [facility];  Service: Podiatry;  Laterality: Left;   PERIPHERAL VASCULAR CATHETERIZATION Right 01/24/2016   Procedure: Lower Extremity Angiography;  Surgeon: Renford Dills, MD;  Location: [facility];  Service: Cardiovascular;  Laterality: Right;   PERIPHERAL VASCULAR CATHETERIZATION Right 01/25/2016   Procedure: Lower Extremity Angiography;  Surgeon: Renford Dills, MD;  Location: [facility];  Service: Cardiovascular;  Laterality: Right;   PORTACATH PLACEMENT [name] 03/18/2023   Procedure: INSERTION PORT-A-CATH;  Surgeon: Carolan Shiver, MD;  Location: [facility];  Service: General;  Laterality: [name];   TOE AMPUTATION     TONSILLECTOMY         Inpatient Medications: Scheduled Meds:  apixaban  5 mg Per Tube BID   Chlorhexidine Gluconate Cloth  6 each  Topical Daily   docusate  100 mg Per Tube BID   free water  30 mL Per Tube Q4H   insulin aspart  0-15 Units Subcutaneous Q4H   levothyroxine  25 mcg Per Tube Q0600   mupirocin ointment  1 Application Nasal BID   mouth rinse  15 mL Mouth Rinse Q2H   polyethylene glycol  17 g Per Tube Daily   Continuous Infusions:  amiodarone 60 mg/hr (07/03/23 0740)   amiodarone     feeding supplement (NEPRO CARB STEADY) Stopped (07/03/23 0725)   norepinephrine (LEVOPHED) Adult infusion Stopped (07/02/23 0817)   piperacillin-tazobactam (ZOSYN)  IV 3.375 g (07/03/23 0634)   vancomycin 150 mL/hr at 07/02/23 1800   PRN Meds: ipratropium-albuterol, mouth rinse, sodium chloride flush  Allergies:    Allergies  Allergen Reactions   Penicillins Other (See Comments)    Happened at 68 years old and pt. stated he passed out  He has tolerated amoxicillin/clavulanate and ampicillin/sulbactam   Statins     Other reaction(s): Muscle Pain Causes legs to ache per pt   Metformin And Related Diarrhea    Social History:   Social History   Socioeconomic History  Marital status: Married    Spouse name: Not on file   Number of children: Not on file   Years of education: Not on file   Highest education level: Not on file  Occupational History   Not on file  Tobacco Use   Smoking status: Former    Current packs/day: 0.00    Average packs/day: 0.5 packs/day for 45.0 years (22.5 ttl pk-yrs)    Types: Cigarettes    Start date: 04/04/1970    Quit date: 04/05/2015    Years since quitting: 8.2   Smokeless tobacco: Never  Vaping Use   Vaping status: Every Day  Substance and Sexual Activity   Alcohol use: Not Currently    Alcohol/week: 3.0 standard drinks of alcohol    Types: 3 Glasses of wine per week   Drug use: No   Sexual activity: Not on file  Other Topics Concern   Not on file  Social History Narrative   Not on file   Social Drivers of Health   Financial Resource Strain: Low Risk  (09/28/2022)    Received from [facility]   Overall Financial Resource Strain (CARDIA)    Difficulty of Paying Living Expenses: Not hard at all  Food Insecurity: No Food Insecurity (06/29/2023)   Hunger Vital Sign    Worried About Running Out of Food in the Last Year: Never true    Ran Out of Food in the Last Year: Never true  Transportation Needs: No Transportation Needs (06/29/2023)   PRAPARE - Administrator, Civil Service (Medical): No    Lack of Transportation (Non-Medical): No  Physical Activity: Not on file  Stress: Not on file  Social Connections: Socially Isolated (06/29/2023)   Social Connection and Isolation Panel [NHANES]    Frequency of Communication with Friends and Family: Never    Frequency of Social Gatherings with Friends and Family: Never    Attends Religious Services: Never    Database administrator or Organizations: No    Attends Banker Meetings: Never    Marital Status: Married  Catering manager Violence: Patient Unable To Answer (06/29/2023)   Humiliation, Afraid, Rape, and Kick questionnaire    Fear of Current or Ex-Partner: Patient unable to answer    Emotionally Abused: Patient unable to answer    Physically Abused: Patient unable to answer    Sexually Abused: Patient unable to answer    Family History:    Family History  Problem Relation Age of Onset   Hypertension Father    Coronary artery disease Father    Alcohol abuse Father    Heart failure Brother      ROS:  Please see the history of present illness.   Physical Exam/Data:   Vitals:   07/03/23 0814 07/03/23 0815 07/03/23 0816 07/03/23 0817  BP: (!) 82/42  (!) 73/49   Pulse: 95 (!) 111 81 (!) 115  Resp: (!) 21 14 11 15   Temp: 99.5 F (37.5 C) 99.5 F (37.5 C) 99.5 F (37.5 C) 99.5 F (37.5 C)  TempSrc:      SpO2: 98% 98% 98% 97%  Weight:      Height:        Intake/Output Summary (Last 24 hours) at 07/03/2023 0821 Last data filed at 07/03/2023 0654 Gross per 24  hour  Intake 632.06 ml  Output 3075 ml  Net -2442.94 ml      07/03/2023    5:00 AM 07/02/2023    3:53  AM 07/01/2023    4:29 AM  Last 3 Weights  Weight (lbs) 259 lb 0.7 oz 265 lb 14 oz 267 lb 13.7 oz  Weight (kg) 117.5 kg 120.6 kg 121.5 kg     Body mass index is 31.53 kg/m.  General:  Well nourished, well developed, in no acute distress HEENT: normal Neck: no JVD Cardiac: tachycardic, normal rhythm, normal S1, S2; no murmur  Lungs:  clear to auscultation bilaterally, no wheezing, rhonchi or rales  Abd: soft, nontender, no hepatomegaly  Ext: bilateral BKA, no edema Skin: warm and dry  Psych:  Normal affect   EKG:  The EKG was personally reviewed and demonstrates: Accelerated junctional rhythm with RBBB Telemetry:  Telemetry was personally reviewed and demonstrates:  sinus rhythm with conversion to atrial flutter at 0722 with RVR, rates up to 180 bpm. Now back in sinus tachycardia, rate 110-130 bpm.   Relevant CV Studies:  03/17/2023 Echo complete 1. Left ventricular ejection fraction, by estimation, is 55 %. The left  ventricle has normal function. The left ventricle demonstrates regional  wall motion abnormalities (mild hypokinesis of the basal inferior wall).  There is mild left ventricular  hypertrophy. Left ventricular diastolic parameters are consistent with  Grade I diastolic dysfunction (impaired relaxation). The average left  ventricular global longitudinal strain is -12.0 %.   2. Right ventricular systolic function is normal. The right ventricular  size is normal. Tricuspid regurgitation signal is inadequate for assessing  PA pressure.   3. The mitral valve is normal in structure. No evidence of mitral valve  regurgitation. No evidence of mitral stenosis.   4. The aortic valve is tricuspid. There is mild calcification of the  aortic valve. Aortic valve regurgitation is not visualized. Aortic valve  sclerosis/calcification is present, without any evidence of aortic   stenosis.   5. The inferior vena cava is normal in size with greater than 50%  respiratory variability, suggesting right atrial pressure of 3 mmHg.   Laboratory Data:  High Sensitivity Troponin:   Recent Labs  Lab 06/13/23 2303 06/14/23 0022 06/15/23 0633 06/15/23 1229 06/29/23 1142  TROPONINIHS 11 11 12 12 11      Chemistry Recent Labs  Lab 07/01/23 0222 07/01/23 1647 07/02/23 0354 07/02/23 1650 07/03/23 0438  NA 135  --  135  --  138  K 5.6*  --  5.4*  --  3.8  CL 100  --  101  --  103  CO2 28  --  25  --  24  GLUCOSE 100*  --  180*  --  147*  BUN 47*  --  32*  --  27*  CREATININE 1.11  --  1.13  --  1.07  CALCIUM 8.8*  --  8.8*  --  8.6*  MG 2.2   < > 1.9 1.7 1.8  GFRNONAA >60  --  >60  --  >60  ANIONGAP 7  --  9  --  11   < > = values in this interval not displayed.    Recent Labs  Lab 06/29/23 1142  PROT 6.8  ALBUMIN 2.8*  AST 15  ALT 17  ALKPHOS 122  BILITOT 0.4   Lipids No results for input(s): "CHOL", "TRIG", "HDL", "LABVLDL", "LDLCALC", "CHOLHDL" in the last 168 hours.  Hematology Recent Labs  Lab 07/01/23 0222 07/02/23 0354 07/03/23 0438  WBC 7.8 6.3 6.6  RBC 2.95* 3.04* 2.85*  HGB 9.1* 9.4* 8.8*  HCT 29.3* 30.0* 28.0*  MCV 99.3 98.7 98.2  MCH 30.8 30.9 30.9  MCHC 31.1 31.3 31.4  RDW 18.3* 17.9* 17.7*  PLT 188 177 170   Thyroid No results for input(s): "TSH", "FREET4" in the last 168 hours.  BNP Recent Labs  Lab 06/29/23 1142  BNP 556.9*    DDimer No results for input(s): "DDIMER" in the last 168 hours.   Radiology/Studies:  [facility] Chest Port 1 View Result Date: 07/01/2023 IMPRESSION: Resolution of previously seen basilar opacity. Right-sided effusion is noted. Electronically Signed   By: Alcide Clever M.D.   On: 07/01/2023 00:39   DG Abd 1 View Result Date: 06/29/2023 IMPRESSION: Tip of the enteric tube just beyond the gastroesophageal junction, unchanged. The side-port is not well visualized. Electronically Signed   By: Narda Rutherford M.D.   On: 06/29/2023 20:23   DG Abd 1 View Result Date: 06/29/2023 IMPRESSION: Enteric tube tip just distal to GE junction region, consider further advancement for more optimal positioning Electronically Signed   By: Jasmine Pang M.D.   On: 06/29/2023 16:27   CT Head Wo Contrast Result Date: 06/29/2023 IMPRESSION: 1. No CT evidence for acute intracranial abnormality. 2. Mild atrophy and minimal chronic small vessel ischemic changes of the white matter Electronically Signed   By: Jasmine Pang M.D.   On: 06/29/2023 16:26   DG Abdomen 1 View Result Date: 06/29/2023 IMPRESSION: Enteric tube tip is just below the level of the GE junction. Recommend further advancing the tube by approximately 4-5 cm. Electronically Signed   By: Signa Kell M.D.   On: 06/29/2023 12:34   DG Chest Port 1 View Result Date: 06/29/2023 IMPRESSION: 1. Endotracheal intubation, tube tip over the midtrachea. 2. Heterogeneous airspace opacity and consolidation of the right lung base, consistent with infection or aspiration. 3. Cardiomegaly. Electronically Signed   By: Jearld Lesch M.D.   On: 06/29/2023 12:15   Assessment and Plan:   Paroxysmal atrial fibrillation/flutter - History of PAF/flutter, difficult to rate control on previous admission 03/2023 and ultimately underwent successful DCCV on 04/23/2023 and was discharged on oral amiodarone - On this admission, initial EKG showed accelerated junctional rhythm with RBBB - K on admission was 7.1, now improved to 3.8 - Recommend K > 4 and mag >2 - CHA2DS2VASc 5 - This morning 0722 3/5 patient converted to wide complex tachycardia. Initial concern for vtach- patient was shocked (synchronized, 120 then 150 then 200 J) and administered amiodarone (150 mg x 3 followed by drip) per CCM - Telemetry reviewed, presumed rhythm was accelerated atrial flutter with conversion to sinus tachycardia - Patient remains in sinus tachycardia at this time, difficult to rate control  secondary to hypotension - Patient is at high risk for atrial flutter recurrence with ongoing pulmonary infection - Continue IV amiodarone - Continue Eliquis 5 mg BID  Acute hypoxic respiratory failure - EMS found patient to be in respiratory distress and bagged patient en route to the ED, condition worsened and patient was intubated - In the setting of rhinovirus infection with possible bacterial pneumonia on antibiotics - Suspect CHF is likely also a factor - Received IV Lasix 40 mg yesterday with -2.3 L of output recorded - Extubated to BiPAP on 3/4 - Will hold off on further diuresis as patient is hypotensive, can consider if pressures improve - Patient no longer requiring supplemental O2 - Management per IM  Acute on chronic HFpEF - Echo 03/2023 with LVEF 55% - Received IV Lasix 40 mg yesterday with -2.3 L of output recorded - Appears  euvolemic on exam - GDMT currently on hold secondary to hypotension   Hypotension - BP 80s mmHg systolic likely secondary to infection - Will start midodrine 5 mg TID for pressure support  Acute kidney injury - Resolved, Cr 1.07  Hx esophageal cancer on radiation therapy - TEE contraindicated  For questions or updates, please contact [facility] Please consult [email or web address] for contact info under    Signed, Orion Crook, PA-C  07/03/2023 8:21 AM

## 2023-07-03 NOTE — Plan of Care (Signed)
  Problem: Education: Goal: Knowledge of General Education information will improve Description: Including pain rating scale, medication(s)/side effects and non-pharmacologic comfort measures Outcome: Progressing   Problem: Health Behavior/Discharge Planning: Goal: Ability to manage health-related needs will improve Outcome: Progressing   Problem: Clinical Measurements: Goal: Diagnostic test results will improve Outcome: Progressing Goal: Respiratory complications will improve Outcome: Progressing Goal: Cardiovascular complication will be avoided Outcome: Progressing   Problem: Activity: Goal: Risk for activity intolerance will decrease Outcome: Progressing   Problem: Nutrition: Goal: Adequate nutrition will be maintained Outcome: Progressing   Problem: Elimination: Goal: Will not experience complications related to bowel motility Outcome: Progressing Goal: Will not experience complications related to urinary retention Outcome: Progressing   Problem: Pain Managment: Goal: General experience of comfort will improve and/or be controlled Outcome: Progressing   Problem: Safety: Goal: Ability to remain free from injury will improve Outcome: Progressing   Problem: Skin Integrity: Goal: Risk for impaired skin integrity will decrease Outcome: Progressing

## 2023-07-03 NOTE — Consult Note (Signed)
 Pharmacy Antibiotic Note  [name]. is a 68 y.o. male admitted on 06/29/2023 with pneumonia. Patient presented after experiencing epigastric discomfort and had his PEG tube evaluated due to oozing of discolored brown foul-smelling discharge. This was treated with a round of doxycyline (10 days). They presented to the ED after developing weakness and respiratory distress. They were intubated after their hypoxia became progressively worse. Pharmacy has been consulted for Vancomycin and Zosyn dosing.  Today, 07/03/2023 Scr 1.11 (baseline)  WBC wnl Afebrile last 24 hours  Bilateral above-knee amputee on wheelchair at baseline  3/1 Imaging:  Heterogeneous airspace opacity and consolidation of the right lung base, consistent with infection or aspiration.  Plan: Day 3 of antibiotics (previously given ceftriaxone and zithromax x 1 on 3/1) Adjust vancomycin  to 1000 mg IV Q12H. Goal AUC 400-550. Expected AUC: 474.6 Expected Css min: 13.4 SCr used: 1.07 mg/dL  Weight used: IBW, Vd used: 0.5 (BMI 44) Patient also started on Zosyn 3.375 g Q8H  Continue to monitor renal function and follow culture results  Height: 6\' 4"  (193 cm) Weight: 117.5 kg (259 lb 0.7 oz) IBW/kg (Calculated) : 86.8  Temp (24hrs), Avg:99.5 F (37.5 C), Min:98.8 F (37.1 C), Max:100.2 F (37.9 C)  Recent Labs  Lab 06/29/23 1142 06/29/23 1411 06/29/23 1606 06/29/23 1824 06/30/23 0008 06/30/23 0534 06/30/23 1147 06/30/23 1632 06/30/23 2247 07/01/23 0222 07/02/23 0354 07/03/23 0438  WBC 7.4  --   --   --   --  6.3  --   --   --  7.8 6.3 6.6  CREATININE 1.39*  --   --  1.25*   < > 1.19   < > 1.21 1.15 1.11 1.13 1.07  LATICACIDVEN 1.0 1.2 1.1 0.9  --   --   --   --   --   --   --   --    < > = values in this interval not displayed.    Estimated Creatinine Clearance: 92.6 mL/min (by C-G formula based on SCr of 1.07 mg/dL).    Allergies  Allergen Reactions   Penicillins Other (See Comments)    Happened  at 68 years old and pt. stated he passed out  He has tolerated amoxicillin/clavulanate and ampicillin/sulbactam   Statins     Other reaction(s): Muscle Pain Causes legs to ache per pt   Metformin And Related Diarrhea    Antimicrobials this admission: 3/1 Zithromax x 1 3/1 ceftriaxone x 1 3/3 Zosyn >> 3/3 vancomycin >>   Microbiology results: 3/1 BCx: NGTD 3/1 Respiratory panel: Rhinovirus  3/1 MRSA PCR: positive  Thank you for allowing pharmacy to be a part of this patient's care.  [name], PharmD, BCPS 07/03/2023 11:50 AM

## 2023-07-04 ENCOUNTER — Ambulatory Visit

## 2023-07-04 ENCOUNTER — Ambulatory Visit: Payer: Medicare Other

## 2023-07-04 DIAGNOSIS — R918 Other nonspecific abnormal finding of lung field: Secondary | ICD-10-CM | POA: Diagnosis not present

## 2023-07-04 DIAGNOSIS — I10 Essential (primary) hypertension: Secondary | ICD-10-CM

## 2023-07-04 LAB — MAGNESIUM: Magnesium: 2 mg/dL (ref 1.7–2.4)

## 2023-07-04 LAB — BASIC METABOLIC PANEL
Anion gap: 10 (ref 5–15)
BUN: 26 mg/dL — ABNORMAL HIGH (ref 8–23)
CO2: 27 mmol/L (ref 22–32)
Calcium: 8.8 mg/dL — ABNORMAL LOW (ref 8.9–10.3)
Chloride: 102 mmol/L (ref 98–111)
Creatinine, Ser: 1.01 mg/dL (ref 0.61–1.24)
GFR, Estimated: >60 mL/min (ref >60)
Glucose, Bld: 216 mg/dL — ABNORMAL HIGH (ref 70–99)
Potassium: 3.8 mmol/L (ref 3.5–5.1)
Sodium: 139 mmol/L (ref 135–145)

## 2023-07-04 LAB — GLUCOSE, CAPILLARY
Glucose-Capillary: 185 mg/dL — ABNORMAL HIGH (ref 70–99)
Glucose-Capillary: 190 mg/dL — ABNORMAL HIGH (ref 70–99)
Glucose-Capillary: 207 mg/dL — ABNORMAL HIGH (ref 70–99)
Glucose-Capillary: 224 mg/dL — ABNORMAL HIGH (ref 70–99)
Glucose-Capillary: 240 mg/dL — ABNORMAL HIGH (ref 70–99)
Glucose-Capillary: 255 mg/dL — ABNORMAL HIGH (ref 70–99)

## 2023-07-04 LAB — CULTURE, BLOOD (ROUTINE X 2)
Culture: NO GROWTH
Culture: NO GROWTH
Special Requests: ADEQUATE

## 2023-07-04 LAB — CBC
HCT: 30.5 % — ABNORMAL LOW (ref 39.0–52.0)
Hemoglobin: 9.4 g/dL — ABNORMAL LOW (ref 13.0–17.0)
MCH: 30.3 pg (ref 26.0–34.0)
MCHC: 30.8 g/dL (ref 30.0–36.0)
MCV: 98.4 fL (ref 80.0–100.0)
Platelets: 168 10*3/uL (ref 150–400)
RBC: 3.1 MIL/uL — ABNORMAL LOW (ref 4.22–5.81)
RDW: 17.8 % — ABNORMAL HIGH (ref 11.5–15.5)
WBC: 6.2 10*3/uL (ref 4.0–10.5)
nRBC: 0 % (ref 0.0–0.2)

## 2023-07-04 LAB — PHOSPHORUS: Phosphorus: 3.3 mg/dL (ref 2.5–4.6)

## 2023-07-04 MED ORDER — PRAMIPEXOLE DIHYDROCHLORIDE 1 MG PO TABS
2.0000 mg | ORAL_TABLET | Freq: Every day | ORAL | Status: DC
  Administered 2023-07-04 – 2023-07-07 (×4): 2 mg
  Filled 2023-07-04 (×5): qty 2

## 2023-07-04 MED ORDER — ADULT MULTIVITAMIN W/MINERALS CH
1.0000 | ORAL_TABLET | Freq: Every day | ORAL | Status: DC
Start: 2023-07-04 — End: 2023-07-04

## 2023-07-04 MED ORDER — ONDANSETRON HCL 4 MG PO TABS: 4.0000 mg | ORAL_TABLET | Freq: Three times a day (TID) | ORAL | Status: DC | PRN

## 2023-07-04 MED ORDER — FINASTERIDE 5 MG PO TABS: 5.0000 mg | ORAL_TABLET | Freq: Every day | ORAL | Status: DC

## 2023-07-04 MED ORDER — GABAPENTIN 300 MG PO CAPS: 300.0000 mg | ORAL_CAPSULE | Freq: Every day | ORAL | Status: DC

## 2023-07-04 MED ORDER — CHLORHEXIDINE GLUCONATE CLOTH 2 % EX PADS
6.0000 | MEDICATED_PAD | Freq: Every day | CUTANEOUS | Status: DC
  Administered 2023-07-04 – 2023-07-08 (×5): 6 via TOPICAL

## 2023-07-04 MED ORDER — AMIODARONE HCL 200 MG PO TABS
400.0000 mg | ORAL_TABLET | Freq: Two times a day (BID) | ORAL | Status: AC
  Administered 2023-07-04 – 2023-07-07 (×7): 400 mg
  Filled 2023-07-04 (×7): qty 2

## 2023-07-04 MED ORDER — TAMSULOSIN HCL 0.4 MG PO CAPS: 0.4000 mg | ORAL_CAPSULE | Freq: Every day | ORAL | Status: DC

## 2023-07-04 MED ORDER — PRIMIDONE 250 MG PO TABS
250.0000 mg | ORAL_TABLET | Freq: Two times a day (BID) | ORAL | Status: DC
  Administered 2023-07-04 – 2023-07-08 (×7): 250 mg
  Filled 2023-07-04 (×10): qty 1

## 2023-07-04 MED ORDER — POTASSIUM CHLORIDE 20 MEQ PO PACK
20.0000 meq | PACK | Freq: Once | ORAL | Status: AC
  Administered 2023-07-04: 20 meq
  Filled 2023-07-04: qty 1

## 2023-07-04 MED ORDER — MIDODRINE HCL 5 MG PO TABS: 5.0000 mg | ORAL_TABLET | Freq: Three times a day (TID) | ORAL | Status: DC

## 2023-07-04 MED ORDER — LOPERAMIDE HCL 1 MG/7.5ML PO SUSP: 2.0000 mg | Freq: Four times a day (QID) | ORAL | Status: DC | PRN

## 2023-07-04 MED ORDER — AMIODARONE HCL 200 MG PO TABS
400.0000 mg | ORAL_TABLET | Freq: Two times a day (BID) | ORAL | Status: DC
  Administered 2023-07-04: 400 mg via ORAL
  Filled 2023-07-04: qty 2

## 2023-07-04 MED ORDER — VITAMIN C 500 MG PO TABS
500.0000 mg | ORAL_TABLET | Freq: Every day | ORAL | Status: DC
  Administered 2023-07-04 – 2023-07-08 (×5): 500 mg
  Filled 2023-07-04 (×5): qty 1

## 2023-07-04 MED ORDER — ZINC SULFATE 220 (50 ZN) MG PO CAPS: 220.0000 mg | ORAL_CAPSULE | Freq: Every day | ORAL | Status: DC

## 2023-07-04 MED ORDER — VITAMIN D 25 MCG (1000 UNIT) PO TABS
1000.0000 [IU] | ORAL_TABLET | Freq: Every day | ORAL | Status: DC
  Administered 2023-07-04 – 2023-07-08 (×5): 1000 [IU]
  Filled 2023-07-04 (×5): qty 1

## 2023-07-04 MED ORDER — ZINC SULFATE 220 (50 ZN) MG PO CAPS
220.0000 mg | ORAL_CAPSULE | Freq: Every day | ORAL | Status: DC
  Administered 2023-07-04 – 2023-07-08 (×5): 220 mg
  Filled 2023-07-04 (×5): qty 1

## 2023-07-04 MED ORDER — CLONAZEPAM 0.5 MG PO TABS
0.5000 mg | ORAL_TABLET | Freq: Two times a day (BID) | ORAL | Status: DC
  Administered 2023-07-04 – 2023-07-08 (×8): 0.5 mg
  Filled 2023-07-04 (×8): qty 1

## 2023-07-04 MED ORDER — HALOPERIDOL LACTATE 5 MG/ML IJ SOLN
2.0000 mg | Freq: Four times a day (QID) | INTRAMUSCULAR | Status: DC | PRN
  Administered 2023-07-04: 2 mg via INTRAVENOUS
  Filled 2023-07-04: qty 1

## 2023-07-04 MED ORDER — GABAPENTIN 300 MG PO CAPS
300.0000 mg | ORAL_CAPSULE | Freq: Every day | ORAL | Status: DC
  Administered 2023-07-04 – 2023-07-07 (×4): 300 mg
  Filled 2023-07-04 (×4): qty 1

## 2023-07-04 MED ORDER — ADULT MULTIVITAMIN W/MINERALS CH
1.0000 | ORAL_TABLET | Freq: Every day | ORAL | Status: DC
  Administered 2023-07-04 – 2023-07-08 (×5): 1
  Filled 2023-07-04 (×5): qty 1

## 2023-07-04 MED ORDER — MIDODRINE HCL 5 MG PO TABS
2.5000 mg | ORAL_TABLET | Freq: Three times a day (TID) | ORAL | Status: DC
  Administered 2023-07-04 – 2023-07-07 (×8): 2.5 mg
  Filled 2023-07-04 (×9): qty 1

## 2023-07-04 NOTE — Progress Notes (Signed)
   Patient Name: [name]. Date of Encounter: 07/04/2023 [facility] Cardiologist: Lorine Bears, MD   Interval Summary  .    The patient remain sin NSR with HR 70s, BPS much better on midodrine. No chest pain reported.   Vital Signs .    Vitals:   07/04/23 0400 07/04/23 0500 07/04/23 0600 07/04/23 0700  BP: 116/65 106/62 (!) 105/59 115/65  Pulse: 70 88 72 73  Resp: 18 16 15 15   Temp: 98.4 F (36.9 C) 98.9 F (37.2 C) 98.6 F (37 C) 98.4 F (36.9 C)  TempSrc:      SpO2: 97%  96% 96%  Weight:  113.8 kg    Height:        Intake/Output Summary (Last 24 hours) at 07/04/2023 0748 Last data filed at 07/04/2023 0700 Gross per 24 hour  Intake 3146.05 ml  Output 600 ml  Net 2546.05 ml      07/04/2023    5:00 AM 07/03/2023    5:00 AM 07/02/2023    3:53 AM  Last 3 Weights  Weight (lbs) 250 lb 14.1 oz 259 lb 0.7 oz 265 lb 14 oz  Weight (kg) 113.8 kg 117.5 kg 120.6 kg      Telemetry/ECG    NSR x 24 hours HR 70-80s - Personally Reviewed  Physical Exam .   GEN: No acute distress.   Neck: No JVD Cardiac: RRR, no murmurs, rubs, or gallops.  Respiratory:course breath sounds GI: Soft, nontender, non-distended  MS: No edema  Assessment & Plan .    Paroxysmal Afib/flutter - h/o PAF/flutter, difficult to rate control on previous admission 03/2023 and ultimately underwent successful DCCV on 04/23/23 and was discharged on oral amiodarone - on this admission, initial EKG showed accelerated juntional rhythm with RBBB - K on admission 7.1, now improved - Keep K>4 and Mag>2 - CHADSVASC of 5 - On 3/5 the patient converted to [facility], initial concern for Vtach, so patient was shocked and started on IV amiodarone - tele presumed to be accelrated atrial flutter with conversion to sinus tachycardia - remains in SR with HR in the 70-80s - switch IV amio to oral amio 400mg  BID - continue Eliquis 5mg  BID  Acute hypoxic respiratory failure - EMS found patient to be in  respiratory distress and bagged patient en route to the ED. Continued worsened and patient was intubated - in the setting of Rhino virus infection with possible bacterial PNA on antibiotics - suspect CHF is also a factor - IV lasix 40mg  daily - Net -1.3L - extubated Bipap on 3/4 - diuresis held for hypotension  Acute on chronic HFpEF - Echo 03/2023 showed LVEF 55% - Iv lasix 40mg  once - appears euvolemic - GDMT held 2/2 hypotension  Hypotension - started on midodrine 5mg  TID with improvement - decrease to 2.5mg  TID> will continue to wean as able  AKI - resolved  H/o esophageal cancer on radiation therapy  - follow-up as OP  For questions or updates, please contact [facility] Please consult [email or web address] for contact info under        Signed, Mayrin Schmuck David Stall, PA-C

## 2023-07-04 NOTE — TOC Progression Note (Signed)
 Transition of Care (TOC) - Progression Note    Patient Details  Name: [name]. MRN: [id number] Date of Birth: [birth year]  Transition of Care [facility]) CM/SW Contact  Garret Reddish, RN Phone Number: 07/04/2023, 3:23 PM  Clinical Narrative:    Chart reviewed.  I have been informed by Baird Lyons, Registered Dietitian that patient will be switching from Glucerna to Nepro tube feeding on discharge. Patient has current tube feeding and supplies with [employer].  I have reached out to [facility] with [employer] and informed him that patient's tube feeding will change on discharge.  Marthann Schiller will send me tube feeding order change form.    TOC will continue to follow for discharge planning.    Expected Discharge Plan: Home w Home Health Services Barriers to Discharge: Continued Medical Work up  Expected Discharge Plan and Services       Living arrangements for the past 2 months: Single Family Home                                       Social Determinants of Health (SDOH) Interventions SDOH Screenings   Food Insecurity: No Food Insecurity (06/29/2023)  Housing: Low Risk  (06/29/2023)  Transportation Needs: No Transportation Needs (06/29/2023)  Utilities: Not At Risk (06/29/2023)  Depression (PHQ2-9): Low Risk  (11/21/2021)  Financial Resource Strain: Low Risk  (09/28/2022)   Received from [facility]  Social Connections: Socially Isolated (06/29/2023)  Tobacco Use: Medium Risk (06/29/2023)    Readmission Risk Interventions    02/13/2022    4:57 PM 09/29/2021   11:12 AM  Readmission Risk Prevention Plan  Transportation Screening Complete Complete  PCP or Specialist Appt within 3-5 Days  Complete  HRI or Home Care Consult  Complete  Social Work Consult for Recovery Care Planning/Counseling  Complete  Palliative Care Screening  Not Applicable  Medication Review Oceanographer) Complete Complete  PCP or Specialist appointment within 3-5 days of discharge Complete   HRI or  Home Care Consult Complete   SW Recovery Care/Counseling Consult Complete   Palliative Care Screening Not Applicable   Skilled Nursing Facility Not Applicable

## 2023-07-04 NOTE — TOC Progression Note (Signed)
 Transition of Care (TOC) - Progression Note    Patient Details  Name: [name]. MRN: [id number] Date of Birth: December 29, 1955  Transition of Care [facility]) CM/SW Contact  Garret Reddish, RN Phone Number: 07/04/2023, 1:04 PM  Clinical Narrative:    Chart reviewed.  Noted that patient was admitted for Acute Respiratory Failure with Hypoxia,Rhinovirus infection,  and possible Bacterial versus Aspiration Pneumonia - Acute on Chronic HFpEF.  Noted that patient has a hx of difficult to control rate. Patient required Amiodarone drip on admission to ICU and now on po Amiodarone.    Noted that patient was admitted to ICU on 06-29-23 and was intubated.  Patient was extubated on 07-02-23 to Bipap now on 02 at 2L per [place]. Patient on IV Zosyn and IV Vancomycin for  possible PNA.  Cardiology following patient.    TOC will continue to follow discharge planning.     Expected Discharge Plan: Home w Home Health Services Barriers to Discharge: Continued Medical Work up  Expected Discharge Plan and Services       Living arrangements for the past 2 months: Single Family Home                                       Social Determinants of Health (SDOH) Interventions SDOH Screenings   Food Insecurity: No Food Insecurity (06/29/2023)  Housing: Low Risk  (06/29/2023)  Transportation Needs: No Transportation Needs (06/29/2023)  Utilities: Not At Risk (06/29/2023)  Depression (PHQ2-9): Low Risk  (11/21/2021)  Financial Resource Strain: Low Risk  (09/28/2022)   Received from [facility]  Social Connections: Socially Isolated (06/29/2023)  Tobacco Use: Medium Risk (06/29/2023)    Readmission Risk Interventions    02/13/2022    4:57 PM 09/29/2021   11:12 AM  Readmission Risk Prevention Plan  Transportation Screening Complete Complete  PCP or Specialist Appt within 3-5 Days  Complete  HRI or Home Care Consult  Complete  Social Work Consult for Recovery Care Planning/Counseling   Complete  Palliative Care Screening  Not Applicable  Medication Review Oceanographer) Complete Complete  PCP or Specialist appointment within 3-5 days of discharge Complete   HRI or Home Care Consult Complete   SW Recovery Care/Counseling Consult Complete   Palliative Care Screening Not Applicable   Skilled Nursing Facility Not Applicable

## 2023-07-04 NOTE — Inpatient Diabetes Management (Signed)
 Inpatient Diabetes Program Recommendations  AACE/ADA: New Consensus Statement on Inpatient Glycemic Control (2015)  Target Ranges:  Prepandial:   less than 140 mg/dL      Peak postprandial:   less than 180 mg/dL (1-2 hours)      Critically ill patients:  140 - 180 mg/dL   Lab Results  Component Value Date   GLUCAP 240 (H) 07/04/2023   HGBA1C 7.8 (H) 03/15/2023    Review of Glycemic Control  Latest Reference Range & Units 07/04/23 07:24 07/04/23 11:59  Glucose-Capillary 70 - 99 mg/dL 161 (H) 096 (H)  (H): Data is abnormally high  Diabetes history: DM2 Outpatient Diabetes medications: Lantus 40 units BID Current orders for Inpatient glycemic control: Novolog 0-15 units Q4H, Nepro Carb Steady @ 60 ml/hr  Inpatient Diabetes Program Recommendations:    Might consider;  Novolog 3 units Q3H tube feed coverage (hold if feeds ar held or discontinued).  Will continue to follow while inpatient.  Thank you, Dulce Sellar, MSN, CDCES Diabetes Coordinator Inpatient Diabetes Program [phone] (team pager from 8a-5p)

## 2023-07-04 NOTE — Progress Notes (Signed)
 Progress Note   Patient: [name]. QIO:[id number] DOB: 10/21/1955 DOA: 06/29/2023     5 DOS: the patient was seen and examined on 07/04/2023   Brief hospital course:  HPI on admission to ICU on 06/29/2023: "68 year old male with a history of CAD, Afib, CHF, esophageal cancer (s/p chemoradiation), PEG tube, DM, obesity, OSA and bilateral above knee amputations who presented to the hospital on 3/1 with respiratory failure, requiring intubation on arrival. He was recently discharged from the hospital on 2/16 after brief admission with chest discomfort.   Given presentation with respiratory distress, the patient was intubated by the ED provider on arrival. He was very hypoxic and unresponsive per family and EMS. He was found by wife after likely fall, with EMS needing to bag him secondary to hypoxia.  Blood work on presentation to the ED was notable for AKI, hyperkalemia, elevated BNP, and rhinovirus on his respiratory viral panel. He is admitted to the ICU for management of respiratory failure."  See ICU progress notes of 3/1--3/4 for detailed hospital events since admission.  Pt was extubated to BiPAP on 3/4.  TRH assumed care on 07/03/2023. Further hospital course and management as outlined below.   Assessment and Plan:  Acute Respiratory Failure with Hypoxia Rhinovirus infection Bacterial versus Aspiration Pneumonia - +MRSA screen Acute on Chronic HFpEF - treated with IV Lasix by PCCM Net IO Since Admission: -703.94 mL [07/04/23 1739] 3/5 >> 3/6 - remains on 2 L/min [place] O2 --Wean O2 as tolerated, goal spO2 > 90% --Continue IV Vanc/Zosyn to complete total 7 day course --May de-escalate antibiotics in 24-48 hours pending further improvement --Pulmonary hygiene --Diuresis as needed and as BP will tolerate (requiring midodrine)  A-fib/flutter with RVR Wide complex tachycardia 3/5 AM -- per PCCM, wide complex tachycardia requiring defibrillation x 3, given IV amio bolus and continue  on amio gtt.  See PCCM progerss note. --Cardiology consulted  --History of difficult to control HR"s and poor candidate for TEE due to high risk  --Transitioned from amiodarone gtt >> oral amio 400 mg BID x 1 week then 200 mg BID --Heparin gtt >> Elquis 5 mg BID - continue --Maintain K>4, Mg>2 --Telemetry  Hypotension - likely related to HR's and infection, multifactorial. --On midodrine 5 mg TID --Maintain MAP>65  Acute Kidney Injury - improving --Monitor BMP --Avoid nephrotoxins --Maintain MAP>65 for adequate renal perfusion  History of Esophageal Cancer - on radiation therapy, s/p chemo --Follow up as scheduled with Oncology and Rad/Onc after d/c      Subjective: Pt seen in stepdown this AM, awake and alert.  RN reported some agitation earlier, pt pulling at lines and difficult to redirect, since had calmed down when I saw him.  He denies complaints but is quite confused and disoriented this AM   Physical Exam: Vitals:   07/04/23 1400 07/04/23 1500 07/04/23 1600 07/04/23 1639  BP:  (!) 152/79 108/61 103/76  Pulse: 79 (!) 35  92  Resp: 19 15 19 20   Temp: 98.8 F (37.1 C) 98.8 F (37.1 C) 99.3 F (37.4 C) 98.4 F (36.9 C)  TempSrc:    Oral  SpO2: 99% 100%  95%  Weight:      Height:       General exam: awake, alert, no acute distress HEENT: atraumatic, clear conjunctiva, anicteric sclera, moist mucus membranes, hearing grossly normal  Respiratory system: lungs clear but diminished, no wheezes, normal respiratory effort. On 2 L/min [place] o2 Cardiovascular system: normal S1/S2, RRR Gastrointestinal system:  soft, NT, ND, PEG tube present Central nervous system: A&O x3. no gross focal neurologic deficits, normal speech Extremities: B/L AKA's, no edema, normal tone Skin: dry, intact, normal temperature Psychiatry: normal mood, congruent affect, judgement and insight appear normal   Data Reviewed:  Notable labs --   BMP normal except glucose 147, Ca 8.6 Procal  0.27 CBC with Hbg 9.4 >> 8.8, normal WBC 6.6  TSH 5.028  CBG's at goal   Family Communication: wife at bedside on rounds 3/5. Not present today, will attempt to call as time allows.   Disposition: Status is: Inpatient Remains inpatient appropriate because: severity of illness as above, on IV therapies   Planned Discharge Destination: Skilled nursing facility    Time spent: 55 minutes including time at bedside and in coordination of care with staff and consultants  Author: Pennie Banter, DO 07/04/2023 5:39 PM  For on call review [email or web address].

## 2023-07-04 NOTE — Plan of Care (Signed)

## 2023-07-04 NOTE — Plan of Care (Signed)
  Problem: Education: Goal: Knowledge of General Education information will improve Description: Including pain rating scale, medication(s)/side effects and non-pharmacologic comfort measures Outcome: Progressing   Problem: Health Behavior/Discharge Planning: Goal: Ability to manage health-related needs will improve Outcome: Progressing   Problem: Nutrition: Goal: Adequate nutrition will be maintained Outcome: Progressing   Problem: Safety: Goal: Ability to remain free from injury will improve Outcome: Progressing   

## 2023-07-04 NOTE — Plan of Care (Signed)
  Problem: Clinical Measurements: Goal: Ability to maintain clinical measurements within normal limits will improve Outcome: Progressing Goal: Will remain free from infection Outcome: Progressing Goal: Diagnostic test results will improve Outcome: Progressing Goal: Respiratory complications will improve Outcome: Progressing Goal: Cardiovascular complication will be avoided Outcome: Progressing   Problem: Activity: Goal: Risk for activity intolerance will decrease Outcome: Progressing   Problem: Coping: Goal: Level of anxiety will decrease Outcome: Progressing   Problem: Elimination: Goal: Will not experience complications related to bowel motility Outcome: Progressing   Problem: Safety: Goal: Ability to remain free from injury will improve Outcome: Progressing   Problem: Skin Integrity: Goal: Risk for impaired skin integrity will decrease Outcome: Progressing

## 2023-07-04 NOTE — Consult Note (Addendum)
 PHARMACY CONSULT NOTE - ELECTROLYTES  Pharmacy Consult for Electrolyte Monitoring and Replacement   Recent Labs: Height: 6\' 4"  (193 cm) Weight: 113.8 kg (250 lb 14.1 oz) IBW/kg (Calculated) : 86.8 Estimated Creatinine Clearance: 96.6 mL/min (by C-G formula based on SCr of 1.01 mg/dL). Potassium (mmol/L)  Date Value  07/04/2023 3.8  07/12/2011 4.4   Magnesium (mg/dL)  Date Value  40/98/1191 2.0   Calcium (mg/dL)  Date Value  47/82/9562 8.8 (L)   Calcium, Total (mg/dL)  Date Value  13/11/6576 8.8   Albumin (g/dL)  Date Value  46/96/2952 2.8 (L)   Phosphorus (mg/dL)  Date Value  84/13/2440 3.3   Sodium (mmol/L)  Date Value  07/04/2023 139  07/12/2011 141   Assessment  [name]. is a 68 y.o. male presenting with unresponsive and resp failure. PMH significant for CHF, COPD, CAD, diabetes, PVD, bilateral below the knee amputations . Pharmacy has been consulted to monitor and replace electrolytes.  Goal of Therapy:  Potassium 4.0 - 5.1 mmol/L Magnesium 2.0 - 2.4 mg/dL All Other Electrolytes WNL  Plan:  Replace with Kcl 20 mEq per tube x 1  No other replacement indicated at this time Continue to monitor electrolytes with AM labs   Because this consult was generated as part of an ICU order set and patient is transferring pharmacy will sign off for now Please feel free to reach out if any further assistance is needed  Thank you for involving pharmacy in this patient's care.   Effie Shy, PharmD Pharmacy Resident  07/04/2023 6:51 AM

## 2023-07-05 ENCOUNTER — Ambulatory Visit: Payer: Medicare Other

## 2023-07-05 ENCOUNTER — Ambulatory Visit

## 2023-07-05 DIAGNOSIS — R918 Other nonspecific abnormal finding of lung field: Secondary | ICD-10-CM | POA: Diagnosis not present

## 2023-07-05 LAB — BASIC METABOLIC PANEL
Anion gap: 9 (ref 5–15)
BUN: 21 mg/dL (ref 8–23)
CO2: 29 mmol/L (ref 22–32)
Calcium: 8.7 mg/dL — ABNORMAL LOW (ref 8.9–10.3)
Chloride: 102 mmol/L (ref 98–111)
Creatinine, Ser: 0.91 mg/dL (ref 0.61–1.24)
GFR, Estimated: >60 mL/min (ref >60)
Glucose, Bld: 198 mg/dL — ABNORMAL HIGH (ref 70–99)
Potassium: 3.8 mmol/L (ref 3.5–5.1)
Sodium: 140 mmol/L (ref 135–145)

## 2023-07-05 LAB — CBC
HCT: 28.2 % — ABNORMAL LOW (ref 39.0–52.0)
Hemoglobin: 9 g/dL — ABNORMAL LOW (ref 13.0–17.0)
MCH: 30.3 pg (ref 26.0–34.0)
MCHC: 31.9 g/dL (ref 30.0–36.0)
MCV: 94.9 fL (ref 80.0–100.0)
Platelets: 176 10*3/uL (ref 150–400)
RBC: 2.97 MIL/uL — ABNORMAL LOW (ref 4.22–5.81)
RDW: 17.8 % — ABNORMAL HIGH (ref 11.5–15.5)
WBC: 7.5 10*3/uL (ref 4.0–10.5)
nRBC: 0 % (ref 0.0–0.2)

## 2023-07-05 LAB — MAGNESIUM: Magnesium: 1.9 mg/dL (ref 1.7–2.4)

## 2023-07-05 LAB — GLUCOSE, CAPILLARY
Glucose-Capillary: 197 mg/dL — ABNORMAL HIGH (ref 70–99)
Glucose-Capillary: 191 mg/dL — ABNORMAL HIGH (ref 70–99)
Glucose-Capillary: 218 mg/dL — ABNORMAL HIGH (ref 70–99)
Glucose-Capillary: 222 mg/dL — ABNORMAL HIGH (ref 70–99)

## 2023-07-05 LAB — PHOSPHORUS: Phosphorus: 2.9 mg/dL (ref 2.5–4.6)

## 2023-07-05 MED ORDER — AMIODARONE HCL 200 MG PO TABS
200.0000 mg | ORAL_TABLET | Freq: Every day | ORAL | Status: DC
  Administered 2023-07-08: 200 mg via ORAL
  Filled 2023-07-05: qty 1

## 2023-07-05 MED ORDER — NEPRO/CARBSTEADY PO LIQD
237.0000 mL | Freq: Every day | ORAL | Status: DC
  Administered 2023-07-06 – 2023-07-08 (×9): 237 mL via ORAL

## 2023-07-05 MED ORDER — INSULIN ASPART 100 UNIT/ML IJ SOLN
3.0000 [IU] | INTRAMUSCULAR | Status: DC
  Administered 2023-07-05 – 2023-07-06 (×5): 3 [IU] via SUBCUTANEOUS
  Filled 2023-07-05 (×4): qty 1

## 2023-07-05 MED ORDER — FREE WATER
200.0000 mL | Freq: Every day | Status: DC
  Administered 2023-07-06 – 2023-07-08 (×14): 200 mL

## 2023-07-05 MED ORDER — SIMETHICONE 40 MG/0.6ML PO SUSP
40.0000 mg | Freq: Four times a day (QID) | ORAL | Status: DC | PRN
  Filled 2023-07-05 (×2): qty 0.6

## 2023-07-05 NOTE — Plan of Care (Signed)

## 2023-07-05 NOTE — Progress Notes (Addendum)
 Progress Note   Patient: [name]. ZOX:[id number] DOB: 08-27-1955 DOA: 06/29/2023     6 DOS: the patient was seen and examined on 07/05/2023   Brief hospital course:  HPI on admission to ICU on 06/29/2023: "68 year old male with a history of CAD, Afib, CHF, esophageal cancer (s/p chemoradiation), PEG tube, DM, obesity, OSA and bilateral above knee amputations who presented to the hospital on 3/1 with respiratory failure, requiring intubation on arrival. He was recently discharged from the hospital on 2/16 after brief admission with chest discomfort.   Given presentation with respiratory distress, the patient was intubated by the ED provider on arrival. He was very hypoxic and unresponsive per family and EMS. He was found by wife after likely fall, with EMS needing to bag him secondary to hypoxia.  Blood work on presentation to the ED was notable for AKI, hyperkalemia, elevated BNP, and rhinovirus on his respiratory viral panel. He is admitted to the ICU for management of respiratory failure."  See ICU progress notes of 3/1--3/4 for detailed hospital events since admission.  Pt was extubated to BiPAP on 3/4.  TRH assumed care on 07/03/2023. Further hospital course and management as outlined below.   Assessment and Plan:  Acute Respiratory Failure with Hypoxia Rhinovirus infection Bacterial versus Aspiration Pneumonia - +MRSA screen Acute on Chronic HFpEF - treated with IV Lasix by PCCM Net IO Since Admission: -1,853.94 mL [07/05/23 1234] 3/5 >> 3/6 - remains on 2 L/min [place] O2 --Wean O2 as tolerated, goal spO2 > 90% --Continue IV Vanc/Zosyn to complete total 7 day course --May de-escalate antibiotics in 24-48 hours pending further improvement --Pulmonary hygiene --Diuresis as needed and as BP will tolerate (requiring midodrine)  A-fib/flutter with RVR Wide complex tachycardia 3/5 AM -- per PCCM, wide complex tachycardia requiring defibrillation x 3, given IV amio bolus and  continue on amio gtt.  See PCCM progerss note. --Cardiology consulted  --History of difficult to control HR"s and poor candidate for TEE due to high risk  --Transitioned from amiodarone gtt >> oral amio 400 mg BID x 1 week then 200 mg BID --Heparin gtt >> Elquis 5 mg BID - continue --Maintain K>4, Mg>2 --Telemetry  Hypotension - likely related to HR's and infection, multifactorial. --On midodrine 5 mg TID --Maintain MAP>65  Acute Kidney Injury - improving --Monitor BMP --Avoid nephrotoxins --Maintain MAP>65 for adequate renal perfusion  History of Esophageal Cancer - on radiation therapy, s/p chemo --Follow up as scheduled with Oncology and Rad/Onc after d/c  Dysphagia due to esophageal cancer.  Dependent on tube feeds. --Continue tube feeds via PEG per RD's orders --Monitor and replace electrolytes  Insulin-dependent type 2 diabetes 3/7 - CBG's above goal yesterday and today --On sliding scale Novolog Q4H --Add 3 units Novolog Q4H in addition to sliding scale      Subjective: Pt seen awake resting in bed this AM. He is more alert, interactive, talkative today.  He talks about his esophageal cancer and undergoing treatment, sometimes too weak to walk when he gets home from radiation, gets briefly tearful discussing this.  He reports abdominal fullness feeling but no other acute complaints.    Physical Exam: Vitals:   07/05/23 0410 07/05/23 0500 07/05/23 0753 07/05/23 1149  BP: 97/65  109/67 120/68  Pulse: 90  84 82  Resp: 16     Temp: 98.3 F (36.8 C)  97.9 F (36.6 C) 98.9 F (37.2 C)  TempSrc: Oral     SpO2: 91%  92% 91%  Weight:  117.9 kg    Height:       General exam: awake, alert, no acute distress HEENT: moist mucus membranes, hearing grossly normal  Respiratory system: lungs clear aeration has improved, no wheezes, normal respiratory effort. Off oxygen on room air. Cardiovascular system: normal S1/S2, RRR Gastrointestinal system: soft, NT, ND, PEG tube  present  Central nervous system: A&O x3. no gross focal neurologic deficits, normal speech Extremities: B/L AKA's, no edema, normal tone Skin: dry, intact, normal temperature Psychiatry: normal mood, congruent affect, judgement and insight appear normal   Data Reviewed:  Notable labs --   BMP normal except glucose 198, Ca 8.7  CBC with Hbg 9.4 >> 8.8 >> 9.0, normal WBC 7.5  CBG's above goal yesterday & today ranging 190--255   Family Communication: wife at bedside on rounds 3/5.  Not present today, will attempt to call as time allows.   Disposition: Status is: Inpatient Remains inpatient appropriate because: severity of illness as above, on IV therapies   Planned Discharge Destination: Skilled nursing facility    Time spent: 45 minutes  Author: Pennie Banter, DO 07/05/2023 12:34 PM  For on call review [email or web address].

## 2023-07-05 NOTE — Care Management Important Message (Signed)
 Important Message  Patient Details  Name: [name]. MRN: [id number] Date of Birth: 1956-04-24   Important Message Given:  Yes - Medicare IM     Cristela Blue, CMA 07/05/2023, 9:47 AM

## 2023-07-05 NOTE — Plan of Care (Signed)
  Problem: Health Behavior/Discharge Planning: Goal: Ability to manage health-related needs will improve Outcome: Progressing   Problem: Activity: Goal: Risk for activity intolerance will decrease Outcome: Progressing   Problem: Elimination: Goal: Will not experience complications related to bowel motility Outcome: Progressing   Problem: Safety: Goal: Ability to remain free from injury will improve Outcome: Progressing   

## 2023-07-05 NOTE — Progress Notes (Signed)
 Nutrition Follow-up  DOCUMENTATION CODES:   Morbid obesity  INTERVENTION:   -TF via g-tube:  Nepro @ 40 ml/hr and increase by 10 ml every 8 hours to goal rate of 60 ml/hr.   30 ml free water flush every 4 hours   Tube feeding regimen provides 2592 kcals, 117 grams of protein, and 1227 ml of H2O.    -Transition to bolus feedings on 07/06/23:  237 ml Nepro 6 times daily  100 ml free water flush before and after each feeding administration  Tube feeding regimen provides 2520 kcal (100% of needs), 114 grams of protein, and 1032 ml of H2O. Total free water: 2232 ml daily  -Case discussed with TOC regarding changes to home TF regimen; DME order placed for discharge -Sent secure chat to DM coordinator to inform of plan to transition to bolus feeds and adjust insulin regimen for optimal control  NUTRITION DIAGNOSIS:   Inadequate oral intake related to inability to eat as evidenced by NPO status.  Ongoing  GOAL:   Patient will meet greater than or equal to 90% of their needs  Progressing   MONITOR:   Diet advancement, Labs, Weight trends, TF tolerance, I & O's, Skin  REASON FOR ASSESSMENT:   Ventilator    ASSESSMENT:   68 y/o male with h/o HTN, CHF, Afib s/p cardioversion 10/23 & 12/24, DM, CAD s/p CABG, PAD, OSA, BPH, gout, depression and COPD who is admitted with acute on chronic hypoxic respiratory failure secondary to acute diastolic CHF exacerbation, pneumonia, MRSA bacteremia, L heel osteomyelitis s/p BKA 6/6, UTI and right-sided pleural effusion s/p right sided thoracentesis w/ removal of 1.2L of pleural fluid 10/25/21, s/p R BKA 11/24, C. diff, R right mass and esophageal mass s/p chemoradiation and resulting dysphagia requiring surgical G-tube placement (73F) 03/18/23 and who is now admitted with acute respiratory failure, AKI, hyperkalemia, elevated BNP and rhinovirus.  3/4- extubated  Reviewed I/O's: +25 ml x 24 hours and -1.4 L since admission  UOP: 1 L x 24  hours   Spoke with pt at bedside, who was more alert in comparison to previous visit. No family present during visit today. Pt reports he is feeling a little better today, but complains of being weak. He states "I don't think I'm getting enough nutrition". RD reviewed with pt that he is currently receiving TF (noted Nepro infusing via PEG at 40 ml/hr).   Pt shared with RD about the pain and side effects that he experiences during radiation treatments. He reports that he usually feels "really bad" for a few days after treatment, but then improves. He complained of pain after treatments which he indicated went "down from my sternum to my stomach". Pt denies any stomach pain, nausea, vomiting, or abdominal distention currently.   Case discussed with RN and TOC. Plan to transition to bolus feedings tomorrow. Pt receives services through [facility] and TOC has been informed about pt's formula change (pt was on Glucerna PTA and has been switched to Nepro secondary to elevated potassium levels). Discussed with cancer center RD, who agrees with changes, as she reports that she was considering changing to Nepro at his next visit if K levels were still elevated. Wife is aware of formula change and plan to transition to bolus feedings. New home TF regimen instructions placed in discharge instructions and new home TF regimen DME order placed.   RD also discussed case with DM coordinator to adjust insulin regimen secondary to transition to bolus feedings.  Medications reviewed and include vitamin C, vitamin D3, klonopin, neurontin, mirapex, and zinc sulfate.   Labs reviewed: CBGS: 190-255 (inpatient orders for glycemic control are 0-15 units insulin aspart every 4 hours).    Diet Order:   Diet Order             Diet NPO time specified  Diet effective now                   EDUCATION NEEDS:   Not appropriate for education at this time  Skin:  Skin Assessment: Skin Integrity Issues: Skin Integrity  Issues:: Other (Comment) Other: skin tear to lt arm and rt groin  Last BM:  07/04/23 (type 6)  Height:   Ht Readings from Last 1 Encounters:  07/02/23 6\' 4"  (1.93 m)    Weight:   Wt Readings from Last 1 Encounters:  07/05/23 117.9 kg    Ideal Body Weight:  75.2 kg (adjusted for bilateral AKA)  BMI:  Body mass index is 31.64 kg/m.  Estimated Nutritional Needs:   Kcal:  2300-2600kcal/day  Protein:  115-130g/day  Fluid:  2.0L/day    Levada Schilling, RD, LDN, CDCES Registered Dietitian III Certified Diabetes Care and Education Specialist If unable to reach this RD, please use "RD Inpatient" group chat on secure chat between hours of 8am-4 pm daily

## 2023-07-05 NOTE — Consult Note (Signed)
 Pharmacy Antibiotic Note  Alec Staver. is a 68 y.o. male admitted on 06/29/2023 with pneumonia. Patient presented after experiencing epigastric discomfort and had his PEG tube evaluated due to oozing of discolored brown foul-smelling discharge. This was treated with a round of doxycyline (10 days). They presented to the ED after developing weakness and respiratory distress. They were intubated after their hypoxia became progressively worse. Pharmacy has been consulted for Vancomycin and Zosyn dosing.  Today, 07/05/2023 Scr 0.91 (baseline)  WBC wnl Afebrile last 24 hours  Bilateral above-knee amputee on wheelchair at baseline  3/1 Imaging:  Heterogeneous airspace opacity and consolidation of the right lung base, consistent with infection or aspiration.  Plan: Continue pip/tazo x 7 days.     Height: 6\' 4"  (193 cm) Weight: 117.9 kg (259 lb 14.8 oz) IBW/kg (Calculated) : 86.8  Temp (24hrs), Avg:98.5 F (36.9 C), Min:97.9 F (36.6 C), Max:99.3 F (37.4 C)  Recent Labs  Lab 06/29/23 1142 06/29/23 1411 06/29/23 1606 06/29/23 1824 06/30/23 0008 07/01/23 0222 07/02/23 0354 07/03/23 0438 07/04/23 0500 07/05/23 0636  WBC 7.4  --   --   --    < > 7.8 6.3 6.6 6.2 7.5  CREATININE 1.39*  --   --  1.25*   < > 1.11 1.13 1.07 1.01 0.91  LATICACIDVEN 1.0 1.2 1.1 0.9  --   --   --   --   --   --    < > = values in this interval not displayed.    Estimated Creatinine Clearance: 109 mL/min (by C-G formula based on SCr of 0.91 mg/dL).    Allergies  Allergen Reactions   Penicillins Other (See Comments)    Happened at 68 years old and pt. stated he passed out  He has tolerated amoxicillin/clavulanate and ampicillin/sulbactam   Statins     Other reaction(s): Muscle Pain Causes legs to ache per pt   Metformin And Related Diarrhea    Antimicrobials this admission: 3/1 Zithromax x 1 3/1 ceftriaxone x 1 3/3 Zosyn >> 3/3 vancomycin >> 3/7  Microbiology results: 3/1 BCx: NGTD 3/1  Respiratory panel: Rhinovirus  3/1 MRSA PCR: positive  Thank you for allowing pharmacy to be a part of this patient's care.  Ronnald Ramp, PharmD, BCPS 07/05/2023 1:00 PM

## 2023-07-05 NOTE — Progress Notes (Signed)
   Patient Name: [name]. Date of Encounter: 07/05/2023 [facility] Cardiologist: Lorine Bears, MD   Interval Summary  .    Patient is off O2. Patient is feeling better. He denies chest pain. Patient remains in NSR.   Vital Signs .    Vitals:   07/04/23 2343 07/05/23 0410 07/05/23 0500 07/05/23 0753  BP: 105/65 97/65  109/67  Pulse: 89 90  84  Resp: 16 16    Temp: 98.2 F (36.8 C) 98.3 F (36.8 C)  97.9 F (36.6 C)  TempSrc: Oral Oral    SpO2: 90% 91%  92%  Weight:   117.9 kg   Height:        Intake/Output Summary (Last 24 hours) at 07/05/2023 0915 Last data filed at 07/04/2023 2200 Gross per 24 hour  Intake 813.81 ml  Output 1000 ml  Net -186.19 ml      07/05/2023    5:00 AM 07/04/2023    5:00 AM 07/03/2023    5:00 AM  Last 3 Weights  Weight (lbs) 259 lb 14.8 oz 250 lb 14.1 oz 259 lb 0.7 oz  Weight (kg) 117.9 kg 113.8 kg 117.5 kg      Telemetry/ECG    NSR HR 80s - Personally Reviewed  Physical Exam .   GEN: No acute distress.   Neck: No JVD Cardiac: RRR, no murmurs, rubs, or gallops.  Respiratory: Clear to auscultation bilaterally. GI: Soft, nontender, non-distended  MS: No edema; b/l AKA  Assessment & Plan .    Paroxysmal Afib/flutter - h/o PAF/flutter, difficult to rate control on previous admission 03/2023 and ultimately underwent successful DCCV on 04/23/23 and was discharged on oral amiodarone - on this admission, initial EKG showed accelerated juntional rhythm with RBBB - K on admission 7.1, now improved - Keep K>4 and Mag>2 - CHADSVASC of 5 - On 3/5 the patient converted to [facility], initial concern for Vtach, so patient was shocked and started on IV amiodarone - tele presumed to be accelrated atrial flutter with conversion to sinus tachycardia - remains in SR with HR in the 70-80s - switch IV amio to oral amio 400mg  BID for 5 gram load, then down to 200mg  daily - continue Eliquis 5mg  BID   Acute hypoxic respiratory  failure Rhinovirus PNA - EMS found patient to be in respiratory distress and bagged patient en route to the ED. Continued worsened and patient was intubated - in the setting of Rhino virus infection with possible bacterial PNA on antibiotics - suspect CHF is also a factor - IV lasix 40mg  daily>held for hypotension - Net -1.3L - extubated Bipap on 3/4   Acute on chronic HFpEF - Echo 03/2023 showed LVEF 55% - Iv lasix 40mg  once - appears euvolemic - GDMT held 2/2 hypotension   Hypotension - midodrine down to 2.5mg  TID> will continue to wean as able   AKI - resolved   H/o esophageal cancer on radiation therapy  - follow-up as OP   For questions or updates, please contact  [facility] Please consult [email or web address] for contact info under        Signed, Joscelyne Renville David Stall, PA-C

## 2023-07-05 NOTE — Discharge Instructions (Signed)
 New home TF regimen:  237 ml Nepro 6 times daily  100 ml free water flush before and after each feeding administration  Tube feeding regimen provides 2520 kcal (100% of needs), 114 grams of protein, and 1032 ml of H2O. Total free water: 2232 ml daily

## 2023-07-05 NOTE — Progress Notes (Signed)
 [facility] Note  Mr. Socorro Ebron. Is currently followed by [facility].  Please do not hesitate to call with any palliative questions or concerns.  Norris Cross, RN Nurse Liaison [phone]

## 2023-07-06 DIAGNOSIS — R918 Other nonspecific abnormal finding of lung field: Secondary | ICD-10-CM | POA: Diagnosis not present

## 2023-07-06 LAB — GLUCOSE, CAPILLARY
Glucose-Capillary: 127 mg/dL — ABNORMAL HIGH (ref 70–99)
Glucose-Capillary: 137 mg/dL — ABNORMAL HIGH (ref 70–99)
Glucose-Capillary: 140 mg/dL — ABNORMAL HIGH (ref 70–99)
Glucose-Capillary: 146 mg/dL — ABNORMAL HIGH (ref 70–99)
Glucose-Capillary: 147 mg/dL — ABNORMAL HIGH (ref 70–99)
Glucose-Capillary: 185 mg/dL — ABNORMAL HIGH (ref 70–99)
Glucose-Capillary: 199 mg/dL — ABNORMAL HIGH (ref 70–99)
Glucose-Capillary: 199 mg/dL — ABNORMAL HIGH (ref 70–99)

## 2023-07-06 MED ORDER — INSULIN ASPART 100 UNIT/ML IJ SOLN
4.0000 [IU] | INTRAMUSCULAR | Status: DC
  Administered 2023-07-06 – 2023-07-08 (×12): 4 [IU] via SUBCUTANEOUS
  Filled 2023-07-06 (×10): qty 1

## 2023-07-06 NOTE — Plan of Care (Signed)

## 2023-07-06 NOTE — Progress Notes (Signed)
 Progress Note   Patient: [name]. ZOX:[id number] DOB: 1955-07-30 DOA: 06/29/2023     7 DOS: the patient was seen and examined on 07/06/2023   Brief hospital course:  HPI on admission to ICU on 06/29/2023: "68 year old male with a history of CAD, Afib, CHF, esophageal cancer (s/p chemoradiation), PEG tube, DM, obesity, OSA and bilateral above knee amputations who presented to the hospital on 3/1 with respiratory failure, requiring intubation on arrival. He was recently discharged from the hospital on 2/16 after brief admission with chest discomfort.   Given presentation with respiratory distress, the patient was intubated by the ED provider on arrival. He was very hypoxic and unresponsive per family and EMS. He was found by wife after likely fall, with EMS needing to bag him secondary to hypoxia.  Blood work on presentation to the ED was notable for AKI, hyperkalemia, elevated BNP, and rhinovirus on his respiratory viral panel. He is admitted to the ICU for management of respiratory failure."  See ICU progress notes of 3/1--3/4 for detailed hospital events since admission.  Pt was extubated to BiPAP on 3/4.  TRH assumed care on 07/03/2023. Further hospital course and management as outlined below.   Assessment and Plan:  Acute Respiratory Failure with Hypoxia Rhinovirus infection Bacterial versus Aspiration Pneumonia - +MRSA screen Acute on Chronic HFpEF - treated with IV Lasix by PCCM Net IO Since Admission: -2,578.94 mL [07/06/23 1142] 3/5 >> 3/6 - remains on 2 L/min [place] O2 3/7 -- weaned to room air --Wean O2 as tolerated, goal spO2 > 90% --Continue IV Vanc/Zosyn to complete total 7 day course --May de-escalate antibiotics in 24-48 hours pending further improvement --Pulmonary hygiene --Diuresis as needed and as BP will tolerate (requiring midodrine)  A-fib/flutter with RVR Wide complex tachycardia 3/5 AM -- per PCCM, wide complex tachycardia requiring defibrillation x 3,  given IV amio bolus and continue on amio gtt.  See PCCM progerss note. --Cardiology consulted - have signed off --History of difficult to control HR"s and poor candidate for TEE due to high risk  --Transitioned from amiodarone gtt >> oral amio 400 mg BID x 1 week then 200 mg BID --Heparin gtt >> Elquis 5 mg BID - continue --Maintain K>4, Mg>2 --Telemetry  Hypotension - likely related to HR's and infection, multifactorial. --On midodrine 5 >> 2.5 mg TID -- continue to wean as BP tolerates --Maintain MAP>65  Acute Kidney Injury - improving --Monitor BMP --Avoid nephrotoxins --Maintain MAP>65 for adequate renal perfusion  History of Esophageal Cancer - on radiation therapy, s/p chemo --Follow up as scheduled with Oncology and Rad/Onc after d/c  Dysphagia due to esophageal cancer.  Dependent on tube feeds. --Continue tube feeds via PEG per RD's orders --Monitor and replace electrolytes  Insulin-dependent type 2 diabetes 3/7 - CBG's above goal yesterday and today --On sliding scale Novolog Q4H --Added 3 units Novolog Q4H in addition to sliding scale      Subjective: Pt seen awake resting in bed this AM.  He denies acute complaints for me.  Little bit more drowsy than yesterday, reports feeling tired.   Physical Exam: Vitals:   07/05/23 1558 07/05/23 2014 07/06/23 0412 07/06/23 0731  BP: 126/69 124/73 122/80 (!) 140/85  Pulse: 86 94 83 81  Resp:  19 17 16   Temp: 98 F (36.7 C) 97.8 F (36.6 C) 98.4 F (36.9 C) 98.3 F (36.8 C)  TempSrc:  Oral Oral Oral  SpO2: 93% 93% 93% 90%  Weight:   113 kg  Height:       General exam: awake, alert, no acute distress, obese, chronically ill appearing HEENT: moist mucus membranes, hearing grossly normal  Respiratory system: CTAB diminished bases, no wheezes, normal respiratory effort, on room air. Cardiovascular system: normal S1/S2, RRR Gastrointestinal system: soft, NT, ND, PEG tube in place. Central nervous system: A&O x 2+. no  gross focal neurologic deficits, normal speech Extremities: bilateral AKA's Skin: dry, intact, normal temperature Psychiatry: normal mood, congruent affect, judgement and insight appear normal    Data Reviewed:  CBG's ranging 127--222 in past 24 hours No new labs today  Notable labs --  3/7 BMP normal except glucose 198, Ca 8.7 3/7 CBC with Hbg 9.4 >> 8.8 >> 9.0, normal WBC 7.5    Family Communication: wife at bedside on rounds 3/5.  Not present today, will attempt to call as time allows.   Disposition: Status is: Inpatient Remains inpatient appropriate because: severity of illness as above, on IV therapies   Planned Discharge Destination: Skilled nursing facility    Time spent: 38 minutes  Author: Pennie Banter, DO 07/06/2023 11:42 AM  For on call review [email or web address].

## 2023-07-07 DIAGNOSIS — R918 Other nonspecific abnormal finding of lung field: Secondary | ICD-10-CM | POA: Diagnosis not present

## 2023-07-07 LAB — GLUCOSE, CAPILLARY
Glucose-Capillary: 161 mg/dL — ABNORMAL HIGH (ref 70–99)
Glucose-Capillary: 161 mg/dL — ABNORMAL HIGH (ref 70–99)
Glucose-Capillary: 192 mg/dL — ABNORMAL HIGH (ref 70–99)
Glucose-Capillary: 168 mg/dL — ABNORMAL HIGH (ref 70–99)
Glucose-Capillary: 199 mg/dL — ABNORMAL HIGH (ref 70–99)

## 2023-07-07 MED ORDER — MIDODRINE HCL 5 MG PO TABS
2.5000 mg | ORAL_TABLET | Freq: Two times a day (BID) | ORAL | Status: DC
  Administered 2023-07-08: 2.5 mg
  Filled 2023-07-07 (×2): qty 1

## 2023-07-07 NOTE — Progress Notes (Signed)
 Progress Note   Patient: [name]. GEX:[id number] DOB: 1956-03-20 DOA: 06/29/2023     8 DOS: the patient was seen and examined on 07/07/2023   Brief hospital course:  HPI on admission to ICU on 06/29/2023: "68 year old male with a history of CAD, Afib, CHF, esophageal cancer (s/p chemoradiation), PEG tube, DM, obesity, OSA and bilateral above knee amputations who presented to the hospital on 3/1 with respiratory failure, requiring intubation on arrival. He was recently discharged from the hospital on 2/16 after brief admission with chest discomfort.   Given presentation with respiratory distress, the patient was intubated by the ED provider on arrival. He was very hypoxic and unresponsive per family and EMS. He was found by wife after likely fall, with EMS needing to bag him secondary to hypoxia.  Blood work on presentation to the ED was notable for AKI, hyperkalemia, elevated BNP, and rhinovirus on his respiratory viral panel. He is admitted to the ICU for management of respiratory failure."  See ICU progress notes of 3/1--3/4 for detailed hospital events since admission.  Pt was extubated to BiPAP on 3/4.  TRH assumed care on 07/03/2023. Further hospital course and management as outlined below.   Assessment and Plan:  Acute Respiratory Failure with Hypoxia Rhinovirus infection Bacterial versus Aspiration Pneumonia - +MRSA screen Acute on Chronic HFpEF - treated with IV Lasix by PCCM Net IO Since Admission: -1,577.24 mL [07/07/23 1633] 3/5 >> 3/6 - remains on 2 L/min [place] O2 3/7 -- weaned to room air --Wean O2 as tolerated, goal spO2 > 90% --Completed 5 days IV Vanc --Completing 7 days IV Zosyn today --May de-escalate antibiotics in 24-48 hours pending further improvement --Pulmonary hygiene --Diuresis as needed and as BP will tolerate (requiring midodrine)  A-fib/flutter with RVR Wide complex tachycardia 3/5 AM -- per PCCM, wide complex tachycardia requiring defibrillation x  3, given IV amio bolus and continue on amio gtt.  See PCCM progerss note. --Cardiology consulted - have signed off --History of difficult to control HR"s and poor candidate for TEE due to high risk  --Transitioned from amiodarone gtt >> oral amio 400 mg BID x 1 week then 200 mg BID --Heparin gtt >> Elquis 5 mg BID - continue --Maintain K>4, Mg>2 --Telemetry  Hypotension - likely related to HR's and infection, multifactorial. --On midodrine 5 >> 2.5 mg TID, reduce to BID -- continue to wean off midodrine as BP tolerates --Maintain MAP>65  Acute Kidney Injury - improving --Monitor BMP --Avoid nephrotoxins --Maintain MAP>65 for adequate renal perfusion  History of Esophageal Cancer - on radiation therapy, s/p chemo --Follow up as scheduled with Oncology and Rad/Onc after d/c  Dysphagia due to esophageal cancer.  Dependent on tube feeds. --Continue tube feeds via PEG per RD's orders --Monitor and replace electrolytes  Insulin-dependent type 2 diabetes 3/9 - CBG's overall at goal --On sliding scale Novolog Q4H --Added 3 units Novolog Q4H in addition to sliding scale      Subjective: Pt was sleeping comfortably when seen this AM, wakes briefly to voice. Denies acute complaints.  Shakes head no when asked if still having abdominal pressure like yesterday.  No acute events reported.   Physical Exam: Vitals:   07/07/23 0418 07/07/23 0812 07/07/23 1125 07/07/23 1621  BP: 129/66 117/76 120/78 134/82  Pulse: 89 84 82 76  Resp: 16     Temp: 98.7 F (37.1 C) 97.9 F (36.6 C) 97.9 F (36.6 C) 98 F (36.7 C)  TempSrc:    Oral  SpO2: 97% 96% 100% 97%  Weight:      Height:       General exam: sleeping, wakes briefly, no acute distress, obese, chronically ill appearing Respiratory system: lungs overall clear, mildly diminished bases, no wheezes, normal respiratory effort, on room air. Cardiovascular system: normal S1/S2, RRR Gastrointestinal system: soft, NT, ND, PEG tube in  place. Central nervous system: exam limited by somonlence Extremities: bilateral AKA's Skin: dry, intact, normal temperature Psychiatry:exam limited by somonlence     Data Reviewed:  No new labs today  Notable labs --  3/7 BMP normal except glucose 198, Ca 8.7 3/7 CBC with Hbg 9.4 >> 8.8 >> 9.0 normal WBC 7.5    Family Communication: wife at bedside on rounds 3/5.  Attempted to call wife today but was unsuccessful. Will attempt to call again later or tomorrow.   Disposition: Status is: Inpatient Remains inpatient appropriate because: closely monitoring for stability, last day IV antibiotics.  Anticipate stable for d/c with [facility] Monday 3/10   Planned Discharge Destination: Skilled nursing facility    Time spent: 35 minutes  Author: Pennie Banter, DO 07/07/2023 4:33 PM  For on call review [email or web address].

## 2023-07-07 NOTE — Plan of Care (Signed)
  Problem: Clinical Measurements: Goal: Will remain free from infection Outcome: Progressing Goal: Diagnostic test results will improve Outcome: Progressing   Problem: Coping: Goal: Level of anxiety will decrease Outcome: Progressing   Problem: Elimination: Goal: Will not experience complications related to bowel motility Outcome: Progressing   Problem: Pain Managment: Goal: General experience of comfort will improve and/or be controlled Outcome: Progressing   Problem: Safety: Goal: Ability to remain free from injury will improve Outcome: Progressing   Problem: Skin Integrity: Goal: Risk for impaired skin integrity will decrease Outcome: Progressing

## 2023-07-08 ENCOUNTER — Ambulatory Visit

## 2023-07-08 ENCOUNTER — Ambulatory Visit: Payer: Medicare Other

## 2023-07-08 ENCOUNTER — Ambulatory Visit: Payer: Medicare Other | Admitting: Physician Assistant

## 2023-07-08 DIAGNOSIS — R918 Other nonspecific abnormal finding of lung field: Secondary | ICD-10-CM | POA: Diagnosis not present

## 2023-07-08 LAB — CBC
HCT: 30.3 % — ABNORMAL LOW (ref 39.0–52.0)
Hemoglobin: 9.6 g/dL — ABNORMAL LOW (ref 13.0–17.0)
MCH: 30.5 pg (ref 26.0–34.0)
MCHC: 31.7 g/dL (ref 30.0–36.0)
MCV: 96.2 fL (ref 80.0–100.0)
Platelets: 198 10*3/uL (ref 150–400)
RBC: 3.15 MIL/uL — ABNORMAL LOW (ref 4.22–5.81)
RDW: 17.9 % — ABNORMAL HIGH (ref 11.5–15.5)
WBC: 6.9 10*3/uL (ref 4.0–10.5)
nRBC: 0 % (ref 0.0–0.2)

## 2023-07-08 LAB — BASIC METABOLIC PANEL
Anion gap: 7 (ref 5–15)
BUN: 18 mg/dL (ref 8–23)
CO2: 28 mmol/L (ref 22–32)
Calcium: 8.5 mg/dL — ABNORMAL LOW (ref 8.9–10.3)
Chloride: 106 mmol/L (ref 98–111)
Creatinine, Ser: 0.82 mg/dL (ref 0.61–1.24)
GFR, Estimated: >60 mL/min (ref >60)
Glucose, Bld: 147 mg/dL — ABNORMAL HIGH (ref 70–99)
Potassium: 3.6 mmol/L (ref 3.5–5.1)
Sodium: 141 mmol/L (ref 135–145)

## 2023-07-08 LAB — GLUCOSE, CAPILLARY
Glucose-Capillary: 133 mg/dL — ABNORMAL HIGH (ref 70–99)
Glucose-Capillary: 144 mg/dL — ABNORMAL HIGH (ref 70–99)
Glucose-Capillary: 182 mg/dL — ABNORMAL HIGH (ref 70–99)
Glucose-Capillary: 208 mg/dL — ABNORMAL HIGH (ref 70–99)

## 2023-07-08 LAB — MAGNESIUM: Magnesium: 2.2 mg/dL (ref 1.7–2.4)

## 2023-07-08 LAB — PHOSPHORUS: Phosphorus: 3.1 mg/dL (ref 2.5–4.6)

## 2023-07-08 MED ORDER — NEPRO/CARBSTEADY PO LIQD: 237.0000 mL | Freq: Every day | ORAL | Status: DC

## 2023-07-08 MED ORDER — POLYETHYLENE GLYCOL 3350 17 G PO PACK: 17.0000 g | PACK | Freq: Every day | ORAL | Status: DC | PRN

## 2023-07-08 MED ORDER — FREE WATER: 80.0000 mL | Freq: Every day | Status: DC

## 2023-07-08 NOTE — TOC Progression Note (Addendum)
 Transition of Care (TOC) - Progression Note    Patient Details  Name: [name]. MRN: [id number] Date of Birth: Jul 14, 1955  Transition of [facility]) CM/SW Contact  Truddie Hidden, RN Phone Number: 07/08/2023, 9:59 AM  Clinical Narrative:    Spoke with Mitch from [employer] about TF formula change from Prosource to Nepro. He is checking with administration on an additional information that may be needed.   Spoke with Maralyn Sago from [facility] to verify services from resumption of care. She will check and return call later .  12:05pm Attempt to reach patient's wife to advised of discharge today. No answer. Left a message requesting return call.   12:38pm Enteral feeding order form completed with changes and MD signature for Nepro sent back to Mitch from [employer].   3:00pm Spoke with Mitch from [employer]. Nepro being processed and is scheduled for delivery within the next 3 days. Mitch advised patient is being sent home with 4 day supply.         Expected Discharge Plan: Home w Home Health Services Barriers to Discharge: Continued Medical Work up  Expected Discharge Plan and Services       Living arrangements for the past 2 months: Single Family Home                                       Social Determinants of Health (SDOH) Interventions SDOH Screenings   Food Insecurity: No Food Insecurity (06/29/2023)  Housing: Low Risk  (06/29/2023)  Transportation Needs: No Transportation Needs (06/29/2023)  Utilities: Not At Risk (06/29/2023)  Depression (PHQ2-9): Low Risk  (11/21/2021)  Financial Resource Strain: Low Risk  (09/28/2022)   Received from [facility]  Social Connections: Socially Isolated (06/29/2023)  Tobacco Use: Medium Risk (06/29/2023)    Readmission Risk Interventions    02/13/2022    4:57 PM 09/29/2021   11:12 AM  Readmission Risk Prevention Plan  Transportation Screening Complete Complete  PCP or Specialist Appt within 3-5 Days  Complete  HRI  or Home Care Consult  Complete  Social Work Consult for Recovery Care Planning/Counseling  Complete  Palliative Care Screening  Not Applicable  Medication Review Oceanographer) Complete Complete  PCP or Specialist appointment within 3-5 days of discharge Complete   HRI or Home Care Consult Complete   SW Recovery Care/Counseling Consult Complete   Palliative Care Screening Not Applicable   Skilled Nursing Facility Not Applicable

## 2023-07-08 NOTE — TOC Transition Note (Signed)
 Transition of Care [facility]) - Discharge Note   Patient Details  Name: [name]. MRN: [id number] Date of Birth: June 25, 1955  Transition of [facility] CM/SW Contact:  Truddie Hidden, RN Phone Number: 07/08/2023, 3:29 PM   Clinical Narrative:    Spoke with patient's wife to advised of discharge plan. She was advised patient is discharging home with a 4 day supply of Nepro while [employer] will delivered another shipment in approximately 3 days. She was also advised EMS would be arranged and HH has been notified.   Sarah from [facility] notified of discharge today.   EMS arranged, "He's next when truck is available."  TOC signing off.     Barriers to Discharge: Continued Medical Work up   Patient Goals and CMS Choice            Discharge Placement                       Discharge Plan and Services Additional resources added to the After Visit Summary for                                       Social Drivers of Health (SDOH) Interventions SDOH Screenings   Food Insecurity: No Food Insecurity (06/29/2023)  Housing: Low Risk  (06/29/2023)  Transportation Needs: No Transportation Needs (06/29/2023)  Utilities: Not At Risk (06/29/2023)  Depression (PHQ2-9): Low Risk  (11/21/2021)  Financial Resource Strain: Low Risk  (09/28/2022)   Received from [facility]  Social Connections: Socially Isolated (06/29/2023)  Tobacco Use: Medium Risk (06/29/2023)     Readmission Risk Interventions    02/13/2022    4:57 PM 09/29/2021   11:12 AM  Readmission Risk Prevention Plan  Transportation Screening Complete Complete  PCP or Specialist Appt within 3-5 Days  Complete  HRI or Home Care Consult  Complete  Social Work Consult for Recovery Care Planning/Counseling  Complete  Palliative Care Screening  Not Applicable  Medication Review Oceanographer) Complete Complete  PCP or Specialist appointment within 3-5 days of discharge Complete   HRI or Home Care  Consult Complete   SW Recovery Care/Counseling Consult Complete   Palliative Care Screening Not Applicable   Skilled Nursing Facility Not Applicable

## 2023-07-08 NOTE — Progress Notes (Deleted)
 Electrophysiology Office Note:   Date:  07/08/2023  ID:  Alec Bender., DOB 03-12-1956, MRN [id number]  Primary Cardiologist: Lorine Bears, MD Electrophysiologist: Alec Putnam, MD  {Click to update primary MD,subspecialty MD or APP then REFRESH:1}    History of Present Illness:   Alec Swamy. is a 68 y.o. male with h/o CAD, Afib, CHF, esophageal cancer (s/p chemoradiation), PEG tube, DM, obesity, OSA and bilateral above knee amputations who is being seen today for evaluation of his atrial fibrillation at the request of Alec Levering, [name].  Patient recently admitted to [facility] from 06/29/2023 through 07/08/2023.  EMS was called after patient had repeated falls at home with weakness.  He was found to be in respiratory distress and hypoxic requiring bagging by EMS.  In the ED he was intubated.  The wife reported that a few days prior to his hospitalization he had epigastric discomfort and brown foul-smelling discharge around his PEG tube site.  He was found to be rhinovirus positive.He was treated empirically for pneumonia.    Discussed the use of AI scribe software for clinical note transcription with the patient, who gave verbal consent to proceed.  History of Present Illness          Review of systems complete and found to be negative unless listed in HPI.   EP Information / Studies Reviewed:    EKG is not ordered today. EKG from 07/03/23 reviewed which showed sinus tachycardia with RBBB      EKG 02/13/22: AF with RVR   Echo 03/17/23:  1. Left ventricular ejection fraction, by estimation, is 55 %. The left  ventricle has normal function. The left ventricle demonstrates regional  wall motion abnormalities (mild hypokinesis of the basal inferior wall).  There is mild left ventricular  hypertrophy. Left ventricular diastolic parameters are consistent with  Grade I diastolic dysfunction (impaired relaxation). The average left  ventricular global longitudinal strain  is -12.0 %.   2. Right ventricular systolic function is normal. The right ventricular  size is normal. Tricuspid regurgitation signal is inadequate for assessing  PA pressure.   3. The mitral valve is normal in structure. No evidence of mitral valve  regurgitation. No evidence of mitral stenosis.   4. The aortic valve is tricuspid. There is mild calcification of the  aortic valve. Aortic valve regurgitation is not visualized. Aortic valve  sclerosis/calcification is present, without any evidence of aortic  stenosis.   5. The inferior vena cava is normal in size with greater than 50%  respiratory variability, suggesting right atrial pressure of 3 mmHg.   Risk Assessment/Calculations:    CHA2DS2-VASc Score = 5  {Confirm score is correct.  If not, click here to update score.  REFRESH note.  :1} This indicates a 7.2% annual risk of stroke. The patient's score is based upon: CHF History: 1 HTN History: 1 Diabetes History: 1 Stroke History: 0 Vascular Disease History: 1 Age Score: 1 Gender Score: 0   {This patient has a significant risk of stroke if diagnosed with atrial fibrillation.  Please consider VKA or DOAC agent for anticoagulation if the bleeding risk is acceptable.   You can also use the SmartPhrase .[email or web address] for documentation.   :[id number]}          Physical Exam:   VS:  There were no vitals taken for this visit.   Wt Readings from Last 3 Encounters:  07/08/23 253 lb 4.9 oz (114.9 kg)  06/13/23 240 lb 1.3 oz (108.9 kg)  06/03/23 240 lb (108.9 kg)     GEN: Well nourished, well developed in no acute distress NECK: No JVD CARDIAC: {EPRHYTHM:28826}, no murmurs, rubs, gallops RESPIRATORY:  Clear to auscultation without rales, wheezing or rhonchi  ABDOMEN: Soft, non-distended EXTREMITIES:  No edema; No deformity   ASSESSMENT AND PLAN:    # Persistent atrial fibrillation: Patient is not an ablation candidate due to his numerous comorbidities.  He is currently on  amiodarone.  He had a recent complex hospitalization complicated by AKI.  I believe amiodarone is most appropriate at this time.  If patient has clinical improvement improves to be more stable then transition to Tikosyn could be considered down the road. -Continue amiodarone 200 mg once daily. -Continue metoprolol 12.5 mg twice daily.  # High risk medication use: LFTs normal on 06/29/2023.  TSH elevated on 07/03/2023 in the setting of acute illness. -Continue amiodarone. -Refer to endocrinology for elevated TSH in setting of amiodarone use.  He is currently on thyroid supplementation.  # Secondary hypercoagulable state due to atrial fibrillation: CHA2DS2-VASc score of 5. -Continue Eliquis.  # CAD: S/p CABG x 2 2006, PCI with DES to RCA 2021. SVG to OM is occluded. Patient denies chest pain, pressure or tightness since unremarkable ED workup on 1/22.  -Continue metoprolol, Zetia, as needed SL NTG.  Not on aspirin secondary to Eliquis.   # Chronic diastolic heart failure: Echo November 2024 showed EF 55%, mild hypokinesis of the basal inferior wall, mild LVH, Grade I DD.  Patient denies dyspnea, orthopnea or PND. Euvolemic and well compensated on exam. -Continue metoprolol. GDMT limited by soft BP.    # PAD: S/p bilateral BKA (left 2023, right 2024). Prior to starting radiation but was able to transfer with a slide board. He has been very fatigued and weak since radiation and is now transferring with a hoyer lift.  -Continue to follow with vascular surgery.   # Hypertension *** goal today.  Recommend checking blood pressures 1-2 times per week at home and recording the values.  Recommend bringing these recordings to the primary care physician.   Follow up with {FAOZH:08657} {EPFOLLOW QI:69629}  Signed, Alec Putnam, MD

## 2023-07-08 NOTE — Progress Notes (Signed)
 Brief Nutrition Follow-Up Note  Case discussed with RN, MD, and TOC. [employer] is aware of formula change and TOC is working on paperwork for this. Formula to be delivered to home. TOC requesting supply of formula at discharge (per TOC, 4 days supply will be sufficient). This was delivered to room personally by RD.   Spoke with pt and wife at bedside in presence of RN. RD provided formula to pt wife. RD also provided paperwork indicating new home regimen. RD discussed how changes were communicated to [employer] as well as [facility].   New home TF regimen:   237 ml Nepro 6 times daily   100 ml free water flush before and after each feeding administration   Tube feeding regimen provides 2520 kcal (100% of needs), 114 grams of protein, and 1032 ml of H2O. Total free water: 2232 ml daily  Plan to discharge home today.   Levada Schilling, RD, LDN, CDCES Registered Dietitian III Certified Diabetes Care and Education Specialist If unable to reach this RD, please use "RD Inpatient" group chat on secure chat between hours of 8am-4 pm daily

## 2023-07-08 NOTE — Plan of Care (Signed)
  Problem: Health Behavior/Discharge Planning: Goal: Ability to manage health-related needs will improve Outcome: Progressing   Problem: Elimination: Goal: Will not experience complications related to bowel motility Outcome: Progressing Goal: Will not experience complications related to urinary retention Outcome: Progressing   Problem: Pain Managment: Goal: General experience of comfort will improve and/or be controlled Outcome: Progressing

## 2023-07-08 NOTE — Discharge Summary (Addendum)
 Physician Discharge Summary   Patient: [name]. MRN: [id number] DOB: 1956-01-04  Admit date:     06/29/2023  Discharge date: 07/08/23  Discharge Physician: Montey Apa   PCP: Yehuda Helms, MD   Recommendations at discharge:   Follow up with Cardiology  Follow up with Primary Care Follow up with Home Health for PT, OT, RN support Continue tube feeds per Dietitian recommendations and monitor labs  Discharge Diagnoses: Principal Problem:   Pulmonary infiltrate in Wood lung on CXR Active Problems:   Acute respiratory failure with hypoxia (HCC)   Atrial fibrillation/flutter (HCC)   Rhinovirus   Pneumonia due to infectious organism   Encephalopathy  Resolved Problems:   * No resolved hospital problems. *  Hospital Course:  HPI on admission to ICU on 06/29/2023: "68 year old male with a history of CAD, Afib, CHF, esophageal cancer (s/p chemoradiation), PEG tube, DM, obesity, OSA and bilateral above knee amputations who presented to the hospital on 3/1 with respiratory failure, requiring intubation on arrival. He was recently discharged from the hospital on 2/16 after brief admission with chest discomfort.   Given presentation with respiratory distress, the patient was intubated by the ED provider on arrival. He was very hypoxic and unresponsive per family and EMS. He was found by wife after likely fall, with EMS needing to bag him secondary to hypoxia.  Blood work on presentation to the ED was notable for AKI, hyperkalemia, elevated BNP, and rhinovirus on his respiratory viral panel. He is admitted to the ICU for management of respiratory failure."   See ICU progress notes of 3/1--3/4 for detailed hospital events since admission.  Pt was extubated to BiPAP on 3/4.   TRH assumed care on 07/03/2023. Further hospital course and management as outlined below.  3/10 -- pt doing well, has no acute complaints today. He completed IV antibiotics yesterday. HR's remain  controlled and O2 stable off oxygen . Pt is medically stable for d/c home with home health today and agreeable.  Assessment and Plan:  Acute Respiratory Failure with Hypoxia - resolved Rhinovirus infection - resolved Bacterial versus Aspiration Pneumonia - +MRSA screen Sepsis due to above - POA with tachycardia, tachypnea - now resolved.  Acute on Chronic HFpEF - treated with IV Lasix  by PCCM Net IO Since Admission: -1,577.24 mL [07/07/23 1633] 3/5 >> 3/6 - remains on 2 L/min [place] O2 3/7 -- weaned to room air --Wean O2 as tolerated, goal spO2 > 90% --Completed 5 days IV Vanc --Completed 7 days IV Zosyn  on 3/9 --Pulmonary hygiene --Follow daily weights, further diuresis as needed per cardiology --Follow up with Cardiology   A-fib/flutter with RVR Wide complex tachycardia 3/5 AM -- per PCCM, wide complex tachycardia requiring defibrillation x 3, given IV amio bolus and continue on amio gtt.  See PCCM progerss note. --Cardiology consulted - have signed off -- outpatient follow up as recommended --History of difficult to control HR"s and poor candidate for TEE due to high risk  --Transitioned from amiodarone  gtt >> oral amio 400 mg BID x 1 week then 200 mg BID --Heparin  gtt >> Elquis 5 mg BID - continue --Maintain K>4, Mg>2 --Telemetry   Hypotension - likely related to HR's and infection, multifactorial. --On midodrine  5 >> 2.5 mg TID, reduce to BID -- continue to wean off midodrine  as BP tolerates --Maintain MAP>65   Acute Kidney Injury due to ATN - improving --Monitor BMP --Avoid nephrotoxins --Maintain MAP>65 for adequate renal perfusion   History of Esophageal Cancer -  on radiation therapy, s/p chemo --Follow up as scheduled with Oncology and Rad/Onc after d/c   Dysphagia due to esophageal cancer.  Dependent on tube feeds. --Continue tube feeds via PEG per RD's orders --Monitor and replace electrolytes   Insulin -dependent type 2 diabetes 3/9 - CBG's overall at goal --On  sliding scale Novolog  Q4H --Added 3 units Novolog  Q4H in addition to sliding scale         Consultants: Cardiology  Procedures performed: None   Disposition: Home health  Diet recommendation:  NPO on tube feeds  DISCHARGE MEDICATION: Allergies as of 07/08/2023       Reactions   Penicillins Other (See Comments)   Happened at 68 years old and pt. stated he passed out  He has tolerated amoxicillin /clavulanate and ampicillin /sulbactam   Statins    Other reaction(s): Muscle Pain Causes legs to ache per pt   Metformin  And Related Diarrhea        Medication List     PAUSE taking these medications    furosemide  40 MG tablet Wait to take this until your doctor or other care provider tells you to start again. Commonly known as: LASIX  Take 40 mg by mouth daily.   isosorbide  mononitrate 60 MG 24 hr tablet Wait to take this until your doctor or other care provider tells you to start again. Commonly known as: IMDUR  Take 60 mg by mouth in the morning and at bedtime.   losartan  50 MG tablet Wait to take this until your doctor or other care provider tells you to start again. Commonly known as: COZAAR  Take 50 mg by mouth daily.   metoprolol  tartrate 25 MG tablet Wait to take this until your doctor or other care provider tells you to start again. Commonly known as: LOPRESSOR  Take 0.5 tablets (12.5 mg total) by mouth 2 (two) times daily.       TAKE these medications    acetaminophen  325 MG tablet Commonly known as: TYLENOL  Place 2 tablets (650 mg total) into feeding tube every 6 (six) hours as needed for mild pain (pain score 1-3) (or Fever >/= 101).   amiodarone  200 MG tablet Commonly known as: PACERONE  Take twice a day through 1/7 and then once daily What changed:  how much to take how to take this when to take this additional instructions   apixaban  5 MG Tabs tablet Commonly known as: Eliquis  Place 1 tablet (5 mg total) into feeding tube 2 (two) times  daily.   ascorbic acid  500 MG tablet Commonly known as: VITAMIN C  Place 1 tablet (500 mg total) into feeding tube daily.   Cholecalciferol  25 MCG (1000 UT) tablet Place 1 tablet (1,000 Units total) into feeding tube daily.   clonazePAM  0.5 MG tablet Commonly known as: KLONOPIN  Place 1 tablet (0.5 mg total) into feeding tube 2 (two) times daily.   Dermacloud Oint Apply 1 Application topically daily as needed.   diltiazem  300 MG 24 hr capsule Commonly known as: CARDIZEM  CD Take 300 mg by mouth daily.   diphenoxylate -atropine  2.5-0.025 MG tablet Commonly known as: LOMOTIL  Place 1 tablet into feeding tube 4 (four) times daily.   doxycycline 100 MG capsule Commonly known as: VIBRAMYCIN Take 100 mg by mouth 2 (two) times daily.   ezetimibe  10 MG tablet Commonly known as: ZETIA  Place 1 tablet (10 mg total) into feeding tube at bedtime.   feeding supplement (NEPRO CARB STEADY) Liqd Place 237 mLs into feeding tube 6 (six) times daily.   ferrous sulfate  300 (  60 Fe) MG/5ML syrup Place 5 mLs (300 mg total) into feeding tube daily. What changed: how much to take   fiber supplement (BANATROL TF) liquid Place 60 mLs into feeding tube 2 (two) times daily.   finasteride  5 MG tablet Commonly known as: PROSCAR  Take 1 tablet by mouth daily.   free water  Soln Place 80 mLs into feeding tube 6 (six) times daily. What changed: how much to take   gabapentin  300 MG capsule Commonly known as: NEURONTIN  Take 1 capsule by mouth at bedtime.   HumaLOG 100 UNIT/ML injection Generic drug: insulin  lispro Inject 0-14 Units into the skin 3 (three) times daily with meals. Sliding scale   Lantus  100 UNIT/ML injection Generic drug: insulin  glargine Inject 40 Units into the skin 2 (two) times daily.   levothyroxine  25 MCG tablet Commonly known as: SYNTHROID  Place 1 tablet (25 mcg total) into feeding tube daily at 6 (six) AM.   lidocaine -prilocaine  cream Commonly known as: EMLA  Apply 1  Application topically as needed.   loperamide  HCl 1 MG/7.5ML suspension Commonly known as: IMODIUM  Place 15 mLs (2 mg total) into feeding tube every 6 (six) hours as needed for diarrhea or loose stools.   multivitamin with minerals tablet Take 1 tablet by mouth daily.   nitrofurantoin  (macrocrystal-monohydrate) 100 MG capsule Commonly known as: MACROBID  Take 100 mg by mouth daily.   nitroGLYCERIN  0.4 MG SL tablet Commonly known as: NITROSTAT  Place 1 tablet (0.4 mg total) under the tongue every 5 (five) minutes as needed for chest pain.   nystatin  powder Commonly known as: MYCOSTATIN /NYSTOP  Apply 1 Application topically 2 (two) times daily. Under belly   ondansetron  4 MG tablet Commonly known as: ZOFRAN  Place 1 tablet (4 mg total) into feeding tube every 8 (eight) hours as needed for nausea.   polyethylene glycol 17 g packet Commonly known as: MIRALAX  / GLYCOLAX  Place 17 g into feeding tube daily as needed for moderate constipation.   pramipexole  1 MG tablet Commonly known as: MIRAPEX  Place 2 tablets (2 mg total) into feeding tube at bedtime.   primidone  250 MG tablet Commonly known as: MYSOLINE  Place 1 tablet (250 mg total) into feeding tube 2 (two) times daily.   tamsulosin  0.4 MG Caps capsule Commonly known as: FLOMAX  Take 0.4 mg by mouth.   vitamin A & D ointment Apply 1 Application topically as needed for dry skin. Apply to stumps   Vitamin B-12 5000 MCG Tbdp Place 10,000 mcg into feeding tube daily.   zinc  sulfate (50mg  elemental zinc ) 220 (50 Zn) MG capsule Take 220 mg by mouth daily.               Durable Medical Equipment  (From admission, onward)           Start     Ordered   07/05/23 1130  For home use only DME Tube feeding  Once       Comments: Home TF DME order:   237 ml Nepro 6 times daily   100 ml free water  flush before and after each feeding administration   Tube feeding regimen provides 2520 kcal (100% of needs), 114 grams of  protein, and 1032 ml of H2O. Total free water : 2232 ml daily   07/05/23 1142            Discharge Exam: Filed Weights   07/05/23 0500 07/06/23 0412 07/08/23 0500  Weight: 117.9 kg 113 kg 114.9 kg   General exam: awake, alert, no acute distress HEENT: atraumatic,  clear conjunctiva, anicteric sclera, moist mucus membranes, hearing grossly normal  Respiratory system: CTA, no wheezes, rales or rhonchi, normal respiratory effort. Cardiovascular system: normal S1/S2, RRR   Gastrointestinal system: soft, NT, ND, PEG tube in place Central nervous system: A&O x 3. no gross focal neurologic deficits, normal speech Extremities: BL AKA's, no edema, normal tone Psychiatry: normal mood, congruent affect   Condition at discharge: stable  The results of significant diagnostics from this hospitalization (including imaging, microbiology, ancillary and laboratory) are listed below for reference.   Imaging Studies: DG Chest Port 1 View Result Date: 07/01/2023 CLINICAL DATA:  Shortness of breath EXAM: PORTABLE CHEST 1 VIEW COMPARISON:  06/29/2023 FINDINGS: Cardiac shadow is stable. Endotracheal tube and gastric catheter are noted in satisfactory position. Wood chest wall port is seen. Postsurgical changes are noted. Small effusion is noted on the Wood. The previously seen parenchymal opacity has resolved and was likely related atelectasis. IMPRESSION: Resolution of previously seen basilar opacity. Wood-sided effusion is noted. Electronically Signed   By: Violeta Grey M.D.   On: 07/01/2023 00:39   DG Abd 1 View Result Date: 06/29/2023 CLINICAL DATA:  Feeding tube placement. EXAM: ABDOMEN - 1 VIEW COMPARISON:  Earlier today. FINDINGS: Tip of the enteric tube is just beyond the gastroesophageal junction, without significant change from earlier today. Side port is not well-defined on the current exam. No bowel dilatation in the included upper abdomen. IMPRESSION: Tip of the enteric tube just beyond the  gastroesophageal junction, unchanged. The side-port is not well visualized. Electronically Signed   By: Chadwick Colonel M.D.   On: 06/29/2023 20:23   DG Abd 1 View Result Date: 06/29/2023 CLINICAL DATA:  OG tube placement EXAM: ABDOMEN - 1 VIEW COMPARISON:  06/29/2023 FINDINGS: Enteric tube tip just distal to GE junction region. Bibasilar airspace disease. Upper gas pattern is unremarkable IMPRESSION: Enteric tube tip just distal to GE junction region, consider further advancement for more optimal positioning Electronically Signed   By: Esmeralda Hedge M.D.   On: 06/29/2023 16:27   CT Head Wo Contrast Result Date: 06/29/2023 CLINICAL DATA:  Head trauma EXAM: CT HEAD WITHOUT CONTRAST TECHNIQUE: Contiguous axial images were obtained from the base of the skull through the vertex without intravenous contrast. RADIATION DOSE REDUCTION: This exam was performed according to the departmental dose-optimization program which includes automated exposure control, adjustment of the mA and/or kV according to patient size and/or use of iterative reconstruction technique. COMPARISON:  CT brain 02/11/2022, brain MRI 04/15/2023 FINDINGS: Brain: No acute territorial infarction, hemorrhage or intracranial mass. Mild atrophy and minimal chronic small vessel ischemic changes of the white matter. Ventricles are nonenlarged Vascular: No hyperdense vessels.  Carotid vascular calcification Skull: Normal. Negative for fracture or focal lesion. Sinuses/Orbits: Mucosal thickening in the sinuses. Small fluid level Wood maxillary sinus. Moderate fluid and debris in the nasopharynx. Other: None IMPRESSION: 1. No CT evidence for acute intracranial abnormality. 2. Mild atrophy and minimal chronic small vessel ischemic changes of the white matter Electronically Signed   By: Esmeralda Hedge M.D.   On: 06/29/2023 16:26   DG Abdomen 1 View Result Date: 06/29/2023 CLINICAL DATA:  Evaluate OG tube placement EXAM: ABDOMEN - 1 VIEW COMPARISON:   06/14/2023 FINDINGS: Enteric tube is identified with tip in just below the level of the GE junction. The side port is at the expected location of the GE junction. Recommend further advancing the tube by approximately 4-5 cm. Bowel gas pattern is nonspecific. No dilated loops of large or  small bowel. IMPRESSION: Enteric tube tip is just below the level of the GE junction. Recommend further advancing the tube by approximately 4-5 cm. Electronically Signed   By: Kimberley Penman M.D.   On: 06/29/2023 12:34   DG Chest Port 1 View Result Date: 06/29/2023 CLINICAL DATA:  Intubation EXAM: PORTABLE CHEST 1 VIEW COMPARISON:  06/13/2023 FINDINGS: Endotracheal intubation, tube tip over the midtrachea. Wood chest port catheter. Cardiomegaly status post median sternotomy. Heterogeneous airspace opacity and consolidation of the Wood lung base. Left lung is normally aerated. No acute osseous findings. IMPRESSION: 1. Endotracheal intubation, tube tip over the midtrachea. 2. Heterogeneous airspace opacity and consolidation of the Wood lung base, consistent with infection or aspiration. 3. Cardiomegaly. Electronically Signed   By: Fredricka Jenny M.D.   On: 06/29/2023 12:15   CT ABDOMEN PELVIS W CONTRAST Result Date: 06/14/2023 CLINICAL DATA:  Abdominal pain, acute, nonlocalized. Esophageal neoplasm history EXAM: CT ABDOMEN AND PELVIS WITH CONTRAST TECHNIQUE: Multidetector CT imaging of the abdomen and pelvis was performed using the standard protocol following bolus administration of intravenous contrast. RADIATION DOSE REDUCTION: This exam was performed according to the departmental dose-optimization program which includes automated exposure control, adjustment of the mA and/or kV according to patient size and/or use of iterative reconstruction technique. CONTRAST:  OMNIPAQUE  IOHEXOL  300 MG/ML  SOLN COMPARISON:  CT abdomen pelvis 03/28/2023, CT abdomen pelvis 08/13/2022 FINDINGS: Lower chest: Bilateral lower lobe  atelectasis. Distal esophageal and gastroesophageal wall thickening. No acute abnormality. Hepatobiliary: No focal liver abnormality. No gallstones, gallbladder wall thickening, or pericholecystic fluid. No biliary dilatation. Pancreas: No focal lesion. Normal pancreatic contour. No surrounding inflammatory changes. No main pancreatic ductal dilatation. Spleen: Normal in size without focal abnormality. Adrenals/Urinary Tract: No adrenal nodule bilaterally. Bilateral kidneys enhance symmetrically. Wood UE with the left thickening. A densely lesions likely represent simple renal cysts. Simple renal cysts, in the absence of clinically indicated signs/symptoms, require no independent follow-up. Question developing heterogeneous enhancing renal mass measuring 5 cm (2:29). No hydronephrosis. No hydroureter. The urinary bladder is unremarkable. Stomach/Bowel: Gastrostomy tube in appropriate position with inflated balloon within the gastric lumen. Stomach is within normal limits. No evidence of bowel wall thickening or dilatation. Colonic diverticulosis. The appendix is not definitely identified with no inflammatory changes in the Wood lower quadrant to suggest acute appendicitis. Vascular/Lymphatic: No abdominal aorta or iliac aneurysm. Severe atherosclerotic plaque of the aorta and its branches. Prominent but nonenlarged retroperitoneal lymph nodes. No abdominal, pelvic, or inguinal lymphadenopathy. Reproductive: Prostate is unremarkable. Other: Trace perihepatic high density free fluid. No intraperitoneal free gas. Grossly stable in size 13 x 4 x 9cm (from 11.5 x 6 cm) perinephric abscess formation that is inseparable from the Wood psoas muscle and renal parenchyma. Abscess likely centered within the psoas muscle and abutting the kidney. Musculoskeletal: No abdominal wall hernia or abnormality. No suspicious lytic or blastic osseous lesions. No acute displaced fracture. Multilevel degenerative changes of the spine.  L3-L4 and L4-L5 posterior disc osteophyte complex formation. IMPRESSION: 1. Grossly stable in size 13 x 4 cm (from 11.5 x 6 cm) perinephric abscess formation that is inseparable from the Wood psoas muscle and renal parenchyma. Abscess likely centered within the psoas muscle and abutting the kidney. 2. Trace perihepatic high density free fluid. 3. Wood urothelial thickening suggestive of infection. Correlate with urinalysis. 4. Question developing heterogeneous enhancing renal mass measuring 5 cm. When the patient is clinically stable and able to follow directions and hold their breath (preferably as an outpatient) further  evaluation with dedicated MRI renal protocol should be considered. 5. Distal esophageal and gastroesophageal wall thickening. Findings consistent with known esophageal malignancy. 6. Colonic diverticulosis with no acute diverticulitis. 7.  Aortic Atherosclerosis (ICD10-I70.0). Electronically Signed   By: Morgane  Naveau M.D.   On: 06/14/2023 17:16   CT Angio Chest PE W/Cm &/Or Wo Cm Result Date: 06/14/2023 CLINICAL DATA:  High probability pulmonary embolism, original carcinoma, chest pain. * Tracking Code: BO * EXAM: CT ANGIOGRAPHY CHEST WITH CONTRAST TECHNIQUE: Multidetector CT imaging of the chest was performed using the standard protocol during bolus administration of intravenous contrast. Multiplanar CT image reconstructions and MIPs were obtained to evaluate the vascular anatomy. RADIATION DOSE REDUCTION: This exam was performed according to the departmental dose-optimization program which includes automated exposure control, adjustment of the mA and/or kV according to patient size and/or use of iterative reconstruction technique. CONTRAST:  OMNIPAQUE  IOHEXOL  350 MG/ML SOLN COMPARISON:  03/15/2023 FINDINGS: Cardiovascular: There is adequate opacification of the pulmonary arterial tree. No intraluminal filling defect identified to suggest acute pulmonary embolism through the  segmental level. Coronary artery bypass grafting has been performed. Cardiac size is within normal limits. No pericardial effusion. Mild atherosclerotic calcification within thoracic aorta. No aortic aneurysm. Wood internal jugular chest port tip is seen within the superior vena cava. Mediastinum/Nodes: Marked circumferential thickening of the distal esophagus with paraesophageal infiltration is again seen and appears stable since prior examination in keeping with the known underlying esophageal malignancy. No pathologic mediastinal adenopathy. Visualized thyroid  is unremarkable. Lungs/Pleura: Stable 5 mm noncalcified pulmonary nodule within the Wood upper lobe, axial image # 31/15, indeterminate. Mild emphysema. Bronchial wall thickening present in keeping with diffuse airway inflammation. No confluent pulmonary infiltrate. No pneumothorax or pleural effusion. Minimal bibasilar atelectasis. Upper Abdomen: Liver retention gastrostomy catheter in expected position within the distal stomach. Shotty gastrohepatic and Wood retrocrural adenopathy is stable. No acute abnormality. Musculoskeletal: No acute bone abnormality. No lytic or blastic bone lesion. Review of the MIP images confirms the above findings. IMPRESSION: 1. No pulmonary embolism. No acute intrathoracic pathology identified. 2. Stable marked circumferential thickening of the distal esophagus with paraesophageal infiltration in keeping with the known underlying esophageal malignancy. 3. Stable 5 mm noncalcified pulmonary nodule within the Wood upper lobe, indeterminate. Close attention on subsequent surveillance imaging is warranted. 4. Stable shotty gastrohepatic and Wood retrocrural adenopathy. Aortic Atherosclerosis (ICD10-I70.0) and Emphysema (ICD10-J43.9). Electronically Signed   By: Worthy Heads M.D.   On: 06/14/2023 01:59   DG Chest Port 1 View Result Date: 06/13/2023 CLINICAL DATA:  Chest pain, head and neck cancer EXAM: PORTABLE CHEST 1  VIEW COMPARISON:  05/22/2023 FINDINGS: 2 frontal views of the chest demonstrate a stable Wood chest wall port. Postsurgical changes from median sternotomy. The cardiac silhouette is unremarkable. Stable ectasia of the thoracic aorta. No airspace disease, effusion, or pneumothorax. No acute bony abnormalities. IMPRESSION: 1. Stable chest, no acute process. Electronically Signed   By: Bobbye Burrow M.D.   On: 06/13/2023 23:09    Microbiology: Results for orders placed or performed during the hospital encounter of 06/29/23  Blood culture (routine x 2)     Status: None   Collection Time: 06/29/23 11:45 AM   Specimen: BLOOD  Result Value Ref Range Status   Specimen Description BLOOD BLOOD Wood ARM  Final   Special Requests   Final    BOTTLES DRAWN AEROBIC AND ANAEROBIC Blood Culture results may not be optimal due to an inadequate volume of blood received in culture  bottles   Culture   Final    NO GROWTH 5 DAYS Performed at [facility], [address] [place] [place], [place] [address]    Report Status 07/04/2023 FINAL  Final  MRSA Next Gen by PCR, Nasal     Status: Abnormal   Collection Time: 06/29/23  2:45 PM   Specimen: Nasal Mucosa; Nasal Swab  Result Value Ref Range Status   MRSA by PCR Next Gen DETECTED (A) NOT DETECTED Final    Comment: RESULT CALLED TO, READ BACK BY AND VERIFIED WITH: ALISHA SAWYER/RN AT 1626 ON [id number] BY SKY (NOTE) The GeneXpert MRSA Assay (FDA approved for NASAL specimens only), is one component of a comprehensive MRSA colonization surveillance program. It is not intended to diagnose MRSA infection nor to guide or monitor treatment for MRSA infections. Test performance is not FDA approved in patients less than 110 years old. Performed at [facility], [address] [place], [place] [address]   Resp panel by RT-PCR (RSV, Flu A&B, Covid) Anterior Nasal Swab     Status: None   Collection Time: 06/29/23  2:45 PM   Specimen: Anterior Nasal Swab   Result Value Ref Range Status   SARS Coronavirus 2 by RT PCR NEGATIVE NEGATIVE Final    Comment: (NOTE) SARS-CoV-2 target nucleic acids are NOT DETECTED.  The SARS-CoV-2 RNA is generally detectable in upper respiratory specimens during the acute phase of infection. The lowest concentration of SARS-CoV-2 viral copies this assay can detect is 138 copies/mL. A negative result does not preclude SARS-Cov-2 infection and should not be used as the sole basis for treatment or other patient management decisions. A negative result may occur with  improper specimen collection/handling, submission of specimen other than nasopharyngeal swab, presence of viral mutation(s) within the areas targeted by this assay, and inadequate number of viral copies(<138 copies/mL). A negative result must be combined with clinical observations, patient history, and epidemiological information. The expected result is Negative.  Fact Sheet for Patients:  BloggerCourse.com  Fact Sheet for Healthcare Providers:  SeriousBroker.it  This test is no t yet approved or cleared by the [place]  FDA and  has been authorized for detection and/or diagnosis of SARS-CoV-2 by FDA under an Emergency Use Authorization (EUA). This EUA will remain  in effect (meaning this test can be used) for the duration of the COVID-19 declaration under Section 564(b)(1) of the Act, 21 U.S.C.section 360bbb-3(b)(1), unless the authorization is terminated  or revoked sooner.       Influenza A by PCR NEGATIVE NEGATIVE Final   Influenza B by PCR NEGATIVE NEGATIVE Final    Comment: (NOTE) The Xpert Xpress SARS-CoV-2/FLU/RSV plus assay is intended as an aid in the diagnosis of influenza from Nasopharyngeal swab specimens and should not be used as a sole basis for treatment. Nasal washings and aspirates are unacceptable for Xpert Xpress SARS-CoV-2/FLU/RSV testing.  Fact Sheet for  Patients: BloggerCourse.com  Fact Sheet for Healthcare Providers: SeriousBroker.it  This test is not yet approved or cleared by the [place]  FDA and has been authorized for detection and/or diagnosis of SARS-CoV-2 by FDA under an Emergency Use Authorization (EUA). This EUA will remain in effect (meaning this test can be used) for the duration of the COVID-19 declaration under Section 564(b)(1) of the Act, 21 U.S.C. section 360bbb-3(b)(1), unless the authorization is terminated or revoked.     Resp Syncytial Virus by PCR NEGATIVE NEGATIVE Final    Comment: (NOTE) Fact Sheet for Patients: BloggerCourse.com  Fact Sheet  for Healthcare Providers: SeriousBroker.it  This test is not yet approved or cleared by the [place]  FDA and has been authorized for detection and/or diagnosis of SARS-CoV-2 by FDA under an Emergency Use Authorization (EUA). This EUA will remain in effect (meaning this test can be used) for the duration of the COVID-19 declaration under Section 564(b)(1) of the Act, 21 U.S.C. section 360bbb-3(b)(1), unless the authorization is terminated or revoked.  Performed at [facility], [address] [place], [place] [address]   Blood culture (routine x 2)     Status: None   Collection Time: 06/29/23  4:06 PM   Specimen: BLOOD  Result Value Ref Range Status   Specimen Description BLOOD BLOOD LEFT HAND  Final   Special Requests   Final    BOTTLES DRAWN AEROBIC AND ANAEROBIC Blood Culture adequate volume   Culture   Final    NO GROWTH 5 DAYS Performed at [facility], [address] [place], [place] [address]    Report Status 07/04/2023 FINAL  Final  Respiratory (~20 pathogens) panel by PCR     Status: Abnormal   Collection Time: 06/30/23 11:20 AM   Specimen: Nasopharyngeal Swab; Respiratory  Result Value Ref Range Status   Adenovirus NOT  DETECTED NOT DETECTED Final   Coronavirus 229E NOT DETECTED NOT DETECTED Final    Comment: (NOTE) The Coronavirus on the Respiratory Panel, DOES NOT test for the novel  Coronavirus (2019 nCoV)    Coronavirus HKU1 NOT DETECTED NOT DETECTED Final   Coronavirus NL63 NOT DETECTED NOT DETECTED Final   Coronavirus OC43 NOT DETECTED NOT DETECTED Final   Metapneumovirus NOT DETECTED NOT DETECTED Final   Rhinovirus / Enterovirus DETECTED (A) NOT DETECTED Final   Influenza A NOT DETECTED NOT DETECTED Final   Influenza B NOT DETECTED NOT DETECTED Final   Parainfluenza Virus 1 NOT DETECTED NOT DETECTED Final   Parainfluenza Virus 2 NOT DETECTED NOT DETECTED Final   Parainfluenza Virus 3 NOT DETECTED NOT DETECTED Final   Parainfluenza Virus 4 NOT DETECTED NOT DETECTED Final   Respiratory Syncytial Virus NOT DETECTED NOT DETECTED Final   Bordetella pertussis NOT DETECTED NOT DETECTED Final   Bordetella Parapertussis NOT DETECTED NOT DETECTED Final   Chlamydophila pneumoniae NOT DETECTED NOT DETECTED Final   Mycoplasma pneumoniae NOT DETECTED NOT DETECTED Final    Comment: Performed at [facility], [address], [place], [place] [address]    Labs: CBC: Recent Labs  Lab 07/02/23 0354 07/03/23 0438 07/04/23 0500 07/05/23 0636 07/08/23 0503  WBC 6.3 6.6 6.2 7.5 6.9  HGB 9.4* 8.8* 9.4* 9.0* 9.6*  HCT 30.0* 28.0* 30.5* 28.2* 30.3*  MCV 98.7 98.2 98.4 94.9 96.2  PLT 177 170 168 176 198   Basic Metabolic Panel: Recent Labs  Lab 07/02/23 0354 07/02/23 1650 07/03/23 0438 07/04/23 0500 07/05/23 0636 07/08/23 0503  NA 135  --  138 139 140 141  K 5.4*  --  3.8 3.8 3.8 3.6  CL 101  --  103 102 102 106  CO2 25  --  24 27 29 28   GLUCOSE 180*  --  147* 216* 198* 147*  BUN 32*  --  27* 26* 21 18  CREATININE 1.13  --  1.07 1.01 0.91 0.82  CALCIUM  8.8*  --  8.6* 8.8* 8.7* 8.5*  MG 1.9 1.7 1.8 2.0 1.9 2.2  PHOS 4.5 3.7 3.1 3.3 2.9 3.1   Liver Function Tests: No results for  input(s): "AST", "ALT", "  ALKPHOS", "BILITOT", "PROT", "ALBUMIN " in the last 168 hours. CBG: Recent Labs  Lab 07/07/23 2026 07/08/23 0003 07/08/23 0402 07/08/23 0830 07/08/23 1145  GLUCAP 199* 208* 133* 144* 182*    Discharge time spent: less than 30 minutes.  Signed: Montey Apa, DO [facility] 07/08/2023

## 2023-07-09 ENCOUNTER — Ambulatory Visit

## 2023-07-09 ENCOUNTER — Ambulatory Visit: Payer: Medicare Other | Admitting: Cardiology

## 2023-07-09 ENCOUNTER — Ambulatory Visit: Payer: Medicare Other

## 2023-07-10 ENCOUNTER — Ambulatory Visit

## 2023-07-10 ENCOUNTER — Other Ambulatory Visit: Payer: Self-pay | Admitting: *Deleted

## 2023-07-10 DIAGNOSIS — C155 Malignant neoplasm of lower third of esophagus: Secondary | ICD-10-CM

## 2023-07-11 ENCOUNTER — Telehealth: Payer: Self-pay | Admitting: Cardiovascular Disease

## 2023-07-11 ENCOUNTER — Ambulatory Visit

## 2023-07-11 NOTE — Telephone Encounter (Signed)
 Returned the call to the patient's wife, per dpr. The patient was discharged from the hospital on Monday. He was advised to "pause" the imdur, losartan, and metoprolol.   The patient's wife started him back on the Losartan and Imdur on Tuesday but has held the Metoprolol.   The patient's blood pressure while on the phone was 100/70, pulse 74. The patient was lying in bed when it was taken.   She has been advised to check his blood pressure 1-2 hours after he has taken his medication and keep a log of the readings. She will also check the blood pressure prior to giving his medication to make sure the systolic is above 100.   Patient has an appointment with EP on 07/30/23.  The patient is taking: Diltiazem 300 mg once daily Imdur 60 mg twice daily Losartan 50 mg once daily Amiodarone 200 mg once daily Furosemide 40 mg once daily  Metoprolol on hold

## 2023-07-11 NOTE — Telephone Encounter (Signed)
 Pt c/o medication issue:  1. Name of Medication: isosorbide mononitrate (IMDUR) 60 MG 24 hr tablet  metoprolol tartrate (LOPRESSOR) 25 MG tablet    2. How are you currently taking this medication (dosage and times per day)? Paused  3. Are you having a reaction (difficulty breathing--STAT)? No  4. What is your medication issue? Medications were paused in hosp?? Pt wanting to know when he is to restart medications

## 2023-07-12 ENCOUNTER — Ambulatory Visit

## 2023-07-12 NOTE — Telephone Encounter (Signed)
 Do not resume Imdur, losartan or metoprolol for now.  We will evaluate the need to resume some of these medications when he follows up.

## 2023-07-12 NOTE — Telephone Encounter (Signed)
Pt's wife made aware and verbalized understanding.

## 2023-07-15 ENCOUNTER — Ambulatory Visit

## 2023-07-15 ENCOUNTER — Other Ambulatory Visit: Payer: Self-pay

## 2023-07-15 ENCOUNTER — Ambulatory Visit
Admission: RE | Admit: 2023-07-15 | Discharge: 2023-07-15 | Disposition: A | Discharge status: Home / Self Care | Source: Ambulatory Visit | Attending: Radiation Oncology | Admitting: Radiation Oncology

## 2023-07-15 ENCOUNTER — Inpatient Hospital Stay

## 2023-07-15 ENCOUNTER — Emergency Department
Admission: EM | Admit: 2023-07-15 | Discharge: 2023-07-15 | Disposition: A | Discharge status: Home / Self Care | Attending: Emergency Medicine | Admitting: Emergency Medicine

## 2023-07-15 ENCOUNTER — Emergency Department

## 2023-07-15 DIAGNOSIS — I509 Heart failure, unspecified: Secondary | ICD-10-CM | POA: Diagnosis not present

## 2023-07-15 DIAGNOSIS — Z8619 Personal history of other infectious and parasitic diseases: Secondary | ICD-10-CM | POA: Diagnosis not present

## 2023-07-15 DIAGNOSIS — C155 Malignant neoplasm of lower third of esophagus: Secondary | ICD-10-CM | POA: Insufficient documentation

## 2023-07-15 DIAGNOSIS — I2510 Atherosclerotic heart disease of native coronary artery without angina pectoris: Secondary | ICD-10-CM | POA: Diagnosis not present

## 2023-07-15 DIAGNOSIS — Z7901 Long term (current) use of anticoagulants: Secondary | ICD-10-CM | POA: Diagnosis not present

## 2023-07-15 DIAGNOSIS — Z510 Encounter for antineoplastic radiation therapy: Secondary | ICD-10-CM | POA: Insufficient documentation

## 2023-07-15 DIAGNOSIS — I4891 Unspecified atrial fibrillation: Secondary | ICD-10-CM | POA: Insufficient documentation

## 2023-07-15 DIAGNOSIS — E662 Morbid (severe) obesity with alveolar hypoventilation: Secondary | ICD-10-CM | POA: Diagnosis not present

## 2023-07-15 DIAGNOSIS — G4733 Obstructive sleep apnea (adult) (pediatric): Secondary | ICD-10-CM

## 2023-07-15 DIAGNOSIS — R197 Diarrhea, unspecified: Secondary | ICD-10-CM | POA: Diagnosis not present

## 2023-07-15 DIAGNOSIS — R0602 Shortness of breath: Secondary | ICD-10-CM | POA: Diagnosis present

## 2023-07-15 DIAGNOSIS — K5900 Constipation, unspecified: Secondary | ICD-10-CM | POA: Diagnosis not present

## 2023-07-15 DIAGNOSIS — F17290 Nicotine dependence, other tobacco product, uncomplicated: Secondary | ICD-10-CM | POA: Diagnosis not present

## 2023-07-15 DIAGNOSIS — Z8501 Personal history of malignant neoplasm of esophagus: Secondary | ICD-10-CM | POA: Diagnosis not present

## 2023-07-15 DIAGNOSIS — E1151 Type 2 diabetes mellitus with diabetic peripheral angiopathy without gangrene: Secondary | ICD-10-CM | POA: Diagnosis not present

## 2023-07-15 DIAGNOSIS — Z89512 Acquired absence of left leg below knee: Secondary | ICD-10-CM | POA: Diagnosis not present

## 2023-07-15 LAB — CBC WITH DIFFERENTIAL/PLATELET
Abs Immature Granulocytes: 0.01 10*3/uL (ref 0.00–0.07)
Basophils Absolute: 0.1 10*3/uL (ref 0.0–0.1)
Basophils Relative: 1 %
Eosinophils Absolute: 0.5 10*3/uL (ref 0.0–0.5)
Eosinophils Relative: 9 %
HCT: 29.2 % — ABNORMAL LOW (ref 39.0–52.0)
Hemoglobin: 9.2 g/dL — ABNORMAL LOW (ref 13.0–17.0)
Immature Granulocytes: 0 %
Lymphocytes Relative: 5 %
Lymphs Abs: 0.3 10*3/uL — ABNORMAL LOW (ref 0.7–4.0)
MCH: 30.2 pg (ref 26.0–34.0)
MCHC: 31.5 g/dL (ref 30.0–36.0)
MCV: 95.7 fL (ref 80.0–100.0)
Monocytes Absolute: 0.5 10*3/uL (ref 0.1–1.0)
Monocytes Relative: 7 %
Neutro Abs: 5 10*3/uL (ref 1.7–7.7)
Neutrophils Relative %: 78 %
Platelets: 242 10*3/uL (ref 150–400)
RBC: 3.05 MIL/uL — ABNORMAL LOW (ref 4.22–5.81)
RDW: 17.4 % — ABNORMAL HIGH (ref 11.5–15.5)
WBC: 6.4 10*3/uL (ref 4.0–10.5)
nRBC: 0 % (ref 0.0–0.2)

## 2023-07-15 LAB — RESP PANEL BY RT-PCR (RSV, FLU A&B, COVID)  RVPGX2
Influenza A by PCR: NEGATIVE
Influenza B by PCR: NEGATIVE
Resp Syncytial Virus by PCR: NEGATIVE
SARS Coronavirus 2 by RT PCR: NEGATIVE

## 2023-07-15 LAB — RAD ONC ARIA SESSION SUMMARY
Course Elapsed Days: 48
Plan Fractions Treated to Date: 21
Plan Prescribed Dose Per Fraction: 2 Gy
Plan Total Fractions Prescribed: 25
Plan Total Prescribed Dose: 50 Gy
Reference Point Dosage Given to Date: 42 Gy
Reference Point Session Dosage Given: 2 Gy
Session Number: 21

## 2023-07-15 LAB — CBC (CANCER CENTER ONLY)
HCT: 32.6 % — ABNORMAL LOW (ref 39.0–52.0)
Hemoglobin: 9.9 g/dL — ABNORMAL LOW (ref 13.0–17.0)
MCH: 29.8 pg (ref 26.0–34.0)
MCHC: 30.4 g/dL (ref 30.0–36.0)
MCV: 98.2 fL (ref 80.0–100.0)
Platelet Count: 246 10*3/uL (ref 150–400)
RBC: 3.32 MIL/uL — ABNORMAL LOW (ref 4.22–5.81)
RDW: 17.4 % — ABNORMAL HIGH (ref 11.5–15.5)
WBC Count: 7.9 10*3/uL (ref 4.0–10.5)
nRBC: 0 % (ref 0.0–0.2)

## 2023-07-15 LAB — COMPREHENSIVE METABOLIC PANEL
ALT: 16 U/L (ref 0–44)
AST: 28 U/L (ref 15–41)
Albumin: 2.6 g/dL — ABNORMAL LOW (ref 3.5–5.0)
Alkaline Phosphatase: 106 U/L (ref 38–126)
Anion gap: 8 (ref 5–15)
BUN: 35 mg/dL — ABNORMAL HIGH (ref 8–23)
CO2: 30 mmol/L (ref 22–32)
Calcium: 9.2 mg/dL (ref 8.9–10.3)
Chloride: 96 mmol/L — ABNORMAL LOW (ref 98–111)
Creatinine, Ser: 0.85 mg/dL (ref 0.61–1.24)
GFR, Estimated: >60 mL/min (ref >60)
Glucose, Bld: 226 mg/dL — ABNORMAL HIGH (ref 70–99)
Potassium: 4.3 mmol/L (ref 3.5–5.1)
Sodium: 134 mmol/L — ABNORMAL LOW (ref 135–145)
Total Bilirubin: 0.5 mg/dL (ref 0.0–1.2)
Total Protein: 6.7 g/dL (ref 6.5–8.1)

## 2023-07-15 LAB — TROPONIN I (HIGH SENSITIVITY)
Troponin I (High Sensitivity): 17 ng/L (ref <18)
Troponin I (High Sensitivity): 18 ng/L — ABNORMAL HIGH (ref <18)

## 2023-07-15 LAB — LACTIC ACID, PLASMA: Lactic Acid, Venous: 1.1 mmol/L (ref 0.5–1.9)

## 2023-07-15 LAB — BRAIN NATRIURETIC PEPTIDE: B Natriuretic Peptide: 182.2 pg/mL — ABNORMAL HIGH (ref 0.0–100.0)

## 2023-07-15 NOTE — ED Notes (Signed)
 Per [name] @[facility]  truck should arriving shortly

## 2023-07-15 NOTE — ED Notes (Signed)
 Called [facility] no answer message left for call back

## 2023-07-15 NOTE — ED Notes (Addendum)
 Assumed care of pt at this time. Reported from previous RN that pt is awaiting transportation home. Instructions reviewed w/ wife and pt. D/c paperwork given to wife, she is leaving bedside at this time.

## 2023-07-15 NOTE — ED Provider Notes (Signed)
 [facility] Provider Note    Event Date/Time   First MD Initiated Contact with Patient 07/15/23 1541     (approximate)   History   Shortness of Breath   HPI  [name]. is a 68 y.o. male who presents to the ED for evaluation of Shortness of Breath   Review a medical DC summary from 1 week ago.  Admitted for respiratory failure requiring ED intubation and ICU stay.  He has a history of CAD, A-fib, CHF, esophageal cancer s/p chemo and radiation.  PEG tube.  Bilateral AKA's.  Signs of CHF, rhinovirus, AKI during this admission.  Patient presents to the ED from home via EMS for evaluation of transient shortness of breath.  Wife provides majority of history.  Reports that she has been trying to get him to use the incentive spirometer at home but he is resistant to this.  He was sitting upright with her when he seemed short of breath to her.  She tried to use the incentive spirometer but he was "barely moving it."  Here in the ED by time I see him he reports feeling better and has no complaints.  Reports he was feeling short of breath earlier but not so much now.  He has been put on nasal cannula due to hypoxia on presentation though.  No chest pain, fevers, abdominal pain, emesis   Physical Exam   Triage Vital Signs: ED Triage Vitals  Encounter Vitals Group     BP 07/15/23 1543 (!) 106/56     Systolic BP Percentile --      Diastolic BP Percentile --      Pulse Rate 07/15/23 1543 97     Resp 07/15/23 1543 19     Temp --      Temp src --      SpO2 07/15/23 1541 (!) 89 %     Weight --      Height --      Head Circumference --      Peak Flow --      Pain Score --      Pain Loc --      Pain Education --      Exclude from Growth Chart --     Most recent vital signs: Vitals:   07/15/23 1800 07/15/23 1936  BP:  (!) 117/50  Pulse:  93  Resp:  19  Temp:  98.6 F (37 C)  SpO2: 94% 95%    General: Awake, no distress.  Chronically  ill-appearing.  Bilateral AKA. CV:  Good peripheral perfusion.  Resp:  Normal effort.  No wheezing Abd:  No distention.  PEG and Foley in place.  Soft and benign abdomen throughout.  No tenderness. MSK:  No deformity noted.  Neuro:  No focal deficits appreciated. Other:     ED Results / Procedures / Treatments   Labs (all labs ordered are listed, but only abnormal results are displayed) Labs Reviewed  CBC WITH DIFFERENTIAL/PLATELET - Abnormal; Notable for the following components:      Result Value   RBC 3.05 (*)    Hemoglobin 9.2 (*)    HCT 29.2 (*)    RDW 17.4 (*)    Lymphs Abs 0.3 (*)    All other components within normal limits  COMPREHENSIVE METABOLIC PANEL - Abnormal; Notable for the following components:   Sodium 134 (*)    Chloride 96 (*)    Glucose, Bld 226 (*)  BUN 35 (*)    Albumin 2.6 (*)    All other components within normal limits  BRAIN NATRIURETIC PEPTIDE - Abnormal; Notable for the following components:   B Natriuretic Peptide 182.2 (*)    All other components within normal limits  TROPONIN I (HIGH SENSITIVITY) - Abnormal; Notable for the following components:   Troponin I (High Sensitivity) 18 (*)    All other components within normal limits  RESP PANEL BY RT-PCR (RSV, FLU A&B, COVID)  [id number]  LACTIC ACID, PLASMA  TROPONIN I (HIGH SENSITIVITY)    EKG Sinus rhythm with a rate of 93 bpm.  Right bundle.  No STEMI.  Nonspecific changes.  Treatment is baseline.  RADIOLOGY CXR interpreted by me without evidence of acute cardiopulmonary pathology.  Official radiology report(s): DG Chest Portable 1 View Result Date: 07/15/2023 CLINICAL DATA:  Shortness of breath, acute onset. History of esophageal cancer. Recent admission for pneumonia EXAM: PORTABLE CHEST 1 VIEW COMPARISON:  X-ray 07/01/2023 FINDINGS: Right IJ chest port in place with tip along the upper SVC. Sternal wires. The uppermost sternal wire is fractured. Stable cardiopericardial silhouette with  tortuous ectatic aorta. Slight widening of the mediastinum. Overlapping cardiac leads. No pneumothorax, effusion or edema. Degenerative changes along the spine. Film is under penetrated. IMPRESSION: Postop chest with chest port. Stable tortuous ectatic aorta and widened mediastinum. No consolidation. Electronically Signed   By: Karen Kays M.D.   On: 07/15/2023 17:04    PROCEDURES and INTERVENTIONS:  .1-3 Lead EKG Interpretation  Performed by: Delton Prairie, MD Authorized by: Delton Prairie, MD     Interpretation: normal     ECG rate:  90   ECG rate assessment: normal     Rhythm: sinus rhythm     Ectopy: none     Conduction: normal     Medications - No data to display   IMPRESSION / MDM / ASSESSMENT AND PLAN / ED COURSE  I reviewed the triage vital signs and the nursing notes.  Differential diagnosis includes, but is not limited to, ACS, PTX, PNA, muscle strain/spasm, PE, dissection, anxiety, pleural effusion  {Patient presents with symptoms of an acute illness or injury that is potentially life-threatening.  Patient presents to the ED after a transient episode of shortness of breath.  Does present hypoxic.  CXR without consolidation or clear volume overload.  BNP is mildly elevated but fairly low for the patient on previous readings.  Stable normocytic anemia, negative viral swabs and first troponin.  Will trend troponins, provide incentive spirometer and reassess and see if and get him off of oxygen considering his lack of persistent symptoms and looks fairly well on exam.  Clinical Course as of 07/15/23 2032  Mon Jul 15, 2023  1826 Reassessed.  Remains asymptomatic.  I turned off his nasal cannula oxygen and we discussed his incentive spirometer.  He can pull up to about 1000 cc on this pretty consistently.  They are eager to go home and we discussed this being a possibility but he needs to stay off of oxygen [DS]    Clinical Course User Index [DS] Delton Prairie, MD     FINAL  CLINICAL IMPRESSION(S) / ED DIAGNOSES   Final diagnoses:  Shortness of breath  Obesity hypoventilation syndrome (HCC)  OSA (obstructive sleep apnea)     Rx / DC Orders   ED Discharge Orders     None        Note:  This document was prepared using Dragon voice recognition software  and may include unintentional dictation errors.   Delton Prairie, MD 07/15/23 2032

## 2023-07-15 NOTE — ED Notes (Signed)
 Patient given incentive spirometer and taught how to use. Patient demonstrated proper use of incentive spirometer.

## 2023-07-15 NOTE — ED Notes (Signed)
[facility] here to transport pt back home

## 2023-07-15 NOTE — Discharge Instructions (Addendum)
 Use your incentive spirometer and CPAP machine!!  Return to the ED with any worsening symptoms

## 2023-07-15 NOTE — ED Triage Notes (Signed)
 Pt arrives via [facility] from home for SOB since 2pm. Spouse was feeding him via his G tube when it started and is concerned for aspiration. Pt recently admitted for pneumonia. Pt undergoing chemo for esophageal cancer. Per EMS, SpO2 88%. Pt 94% on 3lpm.

## 2023-07-16 ENCOUNTER — Ambulatory Visit

## 2023-07-17 ENCOUNTER — Ambulatory Visit
Admission: RE | Admit: 2023-07-17 | Discharge: 2023-07-17 | Disposition: A | Discharge status: Home / Self Care | Source: Ambulatory Visit | Attending: Radiation Oncology | Admitting: Radiation Oncology

## 2023-07-17 ENCOUNTER — Other Ambulatory Visit: Payer: Self-pay

## 2023-07-17 DIAGNOSIS — C155 Malignant neoplasm of lower third of esophagus: Secondary | ICD-10-CM | POA: Diagnosis not present

## 2023-07-17 LAB — RAD ONC ARIA SESSION SUMMARY
Course Elapsed Days: 50
Plan Fractions Treated to Date: 22
Plan Prescribed Dose Per Fraction: 2 Gy
Plan Total Fractions Prescribed: 25
Plan Total Prescribed Dose: 50 Gy
Reference Point Dosage Given to Date: 44 Gy
Reference Point Session Dosage Given: 2 Gy
Session Number: 22

## 2023-07-18 ENCOUNTER — Inpatient Hospital Stay: Payer: Medicare Other

## 2023-07-18 NOTE — Progress Notes (Signed)
 Nutrition Follow-up:  Called wife, [name] for scheduled telephone visit.  Reports that she is on a virtual call with PA for [name] and unable to talk at this time.  Will follow up on 3/25  Devell Parkerson B. Elease Hashimoto, CSO, LDN Registered Dietitian [phone]

## 2023-07-19 ENCOUNTER — Other Ambulatory Visit: Payer: Self-pay

## 2023-07-19 ENCOUNTER — Ambulatory Visit
Admission: RE | Admit: 2023-07-19 | Discharge: 2023-07-19 | Disposition: A | Discharge status: Home / Self Care | Source: Ambulatory Visit | Attending: Radiation Oncology | Admitting: Radiation Oncology

## 2023-07-19 DIAGNOSIS — C155 Malignant neoplasm of lower third of esophagus: Secondary | ICD-10-CM | POA: Diagnosis not present

## 2023-07-19 LAB — RAD ONC ARIA SESSION SUMMARY
Course Elapsed Days: 52
Plan Fractions Treated to Date: 23
Plan Prescribed Dose Per Fraction: 2 Gy
Plan Total Fractions Prescribed: 25
Plan Total Prescribed Dose: 50 Gy
Reference Point Dosage Given to Date: 46 Gy
Reference Point Session Dosage Given: 2 Gy
Session Number: 23

## 2023-07-22 ENCOUNTER — Inpatient Hospital Stay: Payer: Medicare Other

## 2023-07-22 ENCOUNTER — Inpatient Hospital Stay (HOSPITAL_BASED_OUTPATIENT_CLINIC_OR_DEPARTMENT_OTHER): Payer: Medicare Other | Admitting: Internal Medicine

## 2023-07-22 ENCOUNTER — Ambulatory Visit

## 2023-07-22 ENCOUNTER — Encounter: Payer: Self-pay | Admitting: Internal Medicine

## 2023-07-22 VITALS — BP 98/60 | HR 90 | Temp 97.0°F | Resp 14

## 2023-07-22 DIAGNOSIS — C155 Malignant neoplasm of lower third of esophagus: Secondary | ICD-10-CM | POA: Diagnosis not present

## 2023-07-22 DIAGNOSIS — Z95828 Presence of other vascular implants and grafts: Secondary | ICD-10-CM

## 2023-07-22 LAB — CBC WITH DIFFERENTIAL (CANCER CENTER ONLY)
Abs Immature Granulocytes: 0.05 10*3/uL (ref 0.00–0.07)
Basophils Absolute: 0.1 10*3/uL (ref 0.0–0.1)
Basophils Relative: 1 %
Eosinophils Absolute: 0.5 10*3/uL (ref 0.0–0.5)
Eosinophils Relative: 6 %
HCT: 32.8 % — ABNORMAL LOW (ref 39.0–52.0)
Hemoglobin: 10.1 g/dL — ABNORMAL LOW (ref 13.0–17.0)
Immature Granulocytes: 1 %
Lymphocytes Relative: 5 %
Lymphs Abs: 0.4 10*3/uL — ABNORMAL LOW (ref 0.7–4.0)
MCH: 29 pg (ref 26.0–34.0)
MCHC: 30.8 g/dL (ref 30.0–36.0)
MCV: 94.3 fL (ref 80.0–100.0)
Monocytes Absolute: 0.6 10*3/uL (ref 0.1–1.0)
Monocytes Relative: 7 %
Neutro Abs: 7.1 10*3/uL (ref 1.7–7.7)
Neutrophils Relative %: 80 %
Platelet Count: 258 10*3/uL (ref 150–400)
RBC: 3.48 MIL/uL — ABNORMAL LOW (ref 4.22–5.81)
RDW: 16.8 % — ABNORMAL HIGH (ref 11.5–15.5)
WBC Count: 8.7 10*3/uL (ref 4.0–10.5)
nRBC: 0 % (ref 0.0–0.2)

## 2023-07-22 LAB — CMP (CANCER CENTER ONLY)
ALT: 19 U/L (ref 0–44)
AST: 18 U/L (ref 15–41)
Albumin: 2.7 g/dL — ABNORMAL LOW (ref 3.5–5.0)
Alkaline Phosphatase: 109 U/L (ref 38–126)
Anion gap: 11 (ref 5–15)
BUN: 32 mg/dL — ABNORMAL HIGH (ref 8–23)
CO2: 26 mmol/L (ref 22–32)
Calcium: 8.4 mg/dL — ABNORMAL LOW (ref 8.9–10.3)
Chloride: 92 mmol/L — ABNORMAL LOW (ref 98–111)
Creatinine: 0.93 mg/dL (ref 0.61–1.24)
GFR, Estimated: >60 mL/min (ref >60)
Glucose, Bld: 238 mg/dL — ABNORMAL HIGH (ref 70–99)
Potassium: 4 mmol/L (ref 3.5–5.1)
Sodium: 129 mmol/L — ABNORMAL LOW (ref 135–145)
Total Bilirubin: 0.3 mg/dL (ref 0.0–1.2)
Total Protein: 7 g/dL (ref 6.5–8.1)

## 2023-07-22 MED ORDER — HEPARIN SOD (PORK) LOCK FLUSH 100 UNIT/ML IV SOLN
500.0000 [IU] | Freq: Once | INTRAVENOUS | Status: AC
  Administered 2023-07-22: 500 [IU] via INTRAVENOUS
  Filled 2023-07-22: qty 5

## 2023-07-22 MED ORDER — SODIUM CHLORIDE 0.9% FLUSH
10.0000 mL | Freq: Once | INTRAVENOUS | Status: AC
Start: 2023-07-22 — End: 2023-07-22
  Administered 2023-07-22: 10 mL via INTRAVENOUS
  Filled 2023-07-22: qty 10

## 2023-07-22 NOTE — Progress Notes (Signed)
 Patient had a CT on 06/29/2023, and some X-rays. Patient's wife would like to know when his next PET scan will be so she can give [facility] a week advance notice.

## 2023-07-22 NOTE — Progress Notes (Signed)
 [facility] CONSULT NOTE  Patient Care Team: [name], MD as PCP - General (Internal Medicine) [name], MD as PCP - Cardiology (Cardiology) Nobie Putnam, MD as PCP - Electrophysiology (Cardiology) [name], MD as Consulting Physician (Oncology)  CHIEF COMPLAINTS/PURPOSE OF CONSULTATION: esophagus cancer  Oncology History Overview Note  IMPRESSION: 1. Distal esophageal cancer with extension into the proximal stomach and metastatic high left paratracheal and gastrohepatic ligament lymph nodes. 2. Chronic right perinephric hematoma with associated peripheral hypermetabolism. 3. Sludge and or gallstones. 4.  Aortic atherosclerosis (ICD10-I70.0). 5. Enlarged pulmonic trunk, indicative of pulmonary arterial hypertension.     Electronically Signed   By: [name] M.D.   On: 05/22/2023 15:15  # JAN 28th, 2025- Start single agent radiation no chemotherapy.   # COPD CHF CAD; chronic UTI; BKA ; diabetes   Esophageal adenocarcinoma (HCC)  03/15/2023 Initial Diagnosis   Esophageal adenocarcinoma (HCC)   04/25/2023 - 04/25/2023 Chemotherapy   Patient is on Treatment Plan : ESOPHAGUS Carboplatin + Paclitaxel Weekly X 6 Weeks with XRT     Malignant neoplasm of lower third of esophagus (HCC)  04/11/2023 Initial Diagnosis   Malignant neoplasm of lower third of esophagus (HCC)   05/28/2023 Cancer Staging   Staging form: Esophagus - Adenocarcinoma, AJCC 8th Edition - Clinical: Stage IVB (cT4a, cN1, pM1) - Signed by [name], MD on 05/28/2023     HISTORY OF PRESENTING ILLNESS: Patient ambulating- wheel chair/- s/p Bil BKA-   Alone/Accompanied by family- wife.  [name]. 68 y.o.  male pleasant patient with a multiple medical problems including COPD/CHF history of bilateral BKA; chronic UTIs-in the newly diagnosed esophageal cancer currently s/p palliative RT is here for a follow up.  Patient had significant  difficulties with radiation- and issues with nutrition- with multiple hospital visits.   Co complains of constipation- mild nausea- s/p zofran. No diarrhea.   Review of Systems  Constitutional:  Positive for malaise/fatigue and weight loss. Negative for chills, diaphoresis and fever.  HENT:  Negative for nosebleeds and sore throat.   Eyes:  Negative for double vision.  Respiratory:  Negative for cough, hemoptysis, sputum production, shortness of breath and wheezing.   Cardiovascular:  Negative for chest pain, palpitations, orthopnea and leg swelling.  Gastrointestinal:  Negative for abdominal pain, blood in stool, constipation, diarrhea, heartburn, melena, nausea and vomiting.  Genitourinary:  Negative for dysuria, frequency and urgency.  Musculoskeletal:  Positive for back pain and joint pain.  Skin: Negative.  Negative for itching and rash.  Neurological:  Negative for dizziness, tingling, focal weakness, weakness and headaches.  Endo/Heme/Allergies:  Does not bruise/bleed easily.  Psychiatric/Behavioral:  Negative for depression. The patient is not nervous/anxious and does not have insomnia.     MEDICAL HISTORY:  Past Medical History:  Diagnosis Date   Arrhythmia    atrial fibrillation   Asthma    CHF (congestive heart failure) (HCC)    COPD (chronic obstructive pulmonary disease) (HCC)    Coronary artery disease    Depression    Diabetes mellitus without complication (HCC)    Gout    History anabolic steroid use    Hyperlipidemia    Hypertension    Hypogonadism in male    MI (myocardial infarction) (HCC)    Morbid obesity (HCC)    Myocardial infarction (HCC)    Peripheral vascular disease (HCC)    Perirectal abscess    Pleurisy    Sleep apnea  CPAP at night, no oxygen   Varicella     SURGICAL HISTORY: Past Surgical History:  Procedure Laterality Date   ABDOMINAL AORTIC ANEURYSM REPAIR     ACHILLES TENDON SURGERY Left 01/10/2021   Procedure: ACHILLES  LENGTHENING/KIDNER;  Surgeon: Rosetta Posner, DPM;  Location: [facility];  Service: Podiatry;  Laterality: Left;   AMPUTATION Left 10/03/2021   Procedure: AMPUTATION BELOW KNEE;  Surgeon: Renford Dills, MD;  Location: [facility];  Service: Vascular;  Laterality: Left;   AMPUTATION Right 05/24/2022   Procedure: AMPUTATION BELOW KNEE;  Surgeon: Annice Needy, MD;  Location: [facility];  Service: General;  Laterality: Right;   AMPUTATION TOE Right 02/10/2016   Procedure: AMPUTATION TOE 3RD TOE;  Surgeon: Gwyneth Revels, DPM;  Location: [facility];  Service: Podiatry;  Laterality: Right;   AMPUTATION TOE Left 02/24/2020   Procedure: AMPUTATION TOE;  Surgeon: Rosetta Posner, DPM;  Location: [facility];  Service: Podiatry;  Laterality: Left;   APPLICATION OF WOUND VAC Left 02/29/2020   Procedure: APPLICATION OF WOUND VAC;  Surgeon: Rosetta Posner, DPM;  Location: [facility];  Service: Podiatry;  Laterality: Left;   BIOPSY  03/15/2023   Procedure: BIOPSY;  Surgeon: Wyline Mood, MD;  Location: [facility] ENDOSCOPY;  Service: Gastroenterology;;   BIOPSY  04/10/2023   Procedure: BIOPSY;  Surgeon: Norma Fredrickson, Boykin Nearing, MD;  Location: [facility]OSCOPY;  Service: Gastroenterology;;   CARDIAC CATHETERIZATION     CARDIOVERSION [name] 02/13/2022   Procedure: CARDIOVERSION;  Surgeon: Lamar Blinks, MD;  Location: [facility];  Service: Cardiovascular;  Laterality: [name];   CARDIOVERSION [name] 04/23/2023   Procedure: CARDIOVERSION;  Surgeon: Antonieta Iba, MD;  Location: [facility];  Service: Cardiovascular;  Laterality: [name];   COLONOSCOPY WITH PROPOFOL [name] 11/18/2015   Procedure: COLONOSCOPY WITH PROPOFOL;  Surgeon: Scot Jun, MD;  Location: [facility] ENDOSCOPY;  Service: Endoscopy;  Laterality: [name];   CORONARY ARTERY BYPASS GRAFT     CORONARY STENT INTERVENTION [name] 02/02/2020   Procedure: CORONARY STENT INTERVENTION;  Surgeon: Marcina Millard, MD;  Location: [facility] INVAS[facility];  Service: Cardiovascular;  Laterality: [name];    ESOPHAGOGASTRODUODENOSCOPY (EGD) WITH PROPOFOL [name] 03/15/2023   Procedure: ESOPHAGOGASTRODUODENOSCOPY (EGD) WITH PROPOFOL;  Surgeon: Wyline Mood, MD;  Location: [facility] ENDOSCOPY;  Service: Gastroenterology;  Laterality: [name];   FLEXIBLE SIGMOIDOSCOPY [name] 04/10/2023   Procedure: FLEXIBLE SIGMOIDOSCOPY;  Surgeon: Toledo, Boykin Nearing, MD;  Location: [facility] ENDOSCOPY;  Service: Gastroenterology;  Laterality: [name];   GASTROSTOMY [name] 03/18/2023   Procedure: INSERTION OF GASTROSTOMY TUBE;  Surgeon: Carolan Shiver, MD;  Location: [facility];  Service: General;  Laterality: [name];   IMPACTION REMOVAL  03/15/2023   Procedure: IMPACTION REMOVAL;  Surgeon: Wyline Mood, MD;  Location: [facility] ENDOSCOPY;  Service: Gastroenterology;;   INCISION AND DRAINAGE Left 08/07/2021   Procedure: INCISION AND DRAINAGE-Partial Calcanectomy;  Surgeon: Rosetta Posner, DPM;  Location: [facility];  Service: Podiatry;  Laterality: Left;   IRRIGATION AND DEBRIDEMENT FOOT Left 02/29/2020   Procedure: IRRIGATION AND DEBRIDEMENT FOOT;  Surgeon: Rosetta Posner, DPM;  Location: [facility];  Service: Podiatry;  Laterality: Left;   IRRIGATION AND DEBRIDEMENT FOOT Left 02/24/2020   Procedure: IRRIGATION AND DEBRIDEMENT FOOT;  Surgeon: Rosetta Posner, DPM;  Location: [facility];  Service: Podiatry;  Laterality: Left;   KNEE ARTHROSCOPY     LEFT HEART CATH AND CORS/GRAFTS ANGIOGRAPHY [name] 02/02/2020   Procedure: LEFT HEART CATH AND CORS/GRAFTS ANGIOGRAPHY;  Surgeon: Dalia Heading, MD;  Location: [facility];  Service: Cardiovascular;  Laterality:  [name];   LOWER EXTREMITY ANGIOGRAPHY Left 02/25/2020   Procedure: Lower Extremity Angiography;  Surgeon: Annice Needy, MD;  Location: [facility];  Service: Cardiovascular;  Laterality: Left;   LOWER EXTREMITY ANGIOGRAPHY Left 01/04/2021   Procedure: LOWER EXTREMITY ANGIOGRAPHY;  Surgeon: Annice Needy, MD;  Location: [facility];  Service: Cardiovascular;  Laterality: Left;   LOWER  EXTREMITY ANGIOGRAPHY Right 05/21/2022   Procedure: Lower Extremity Angiography;  Surgeon: Annice Needy, MD;  Location: [facility];  Service: Cardiovascular;  Laterality: Right;   METATARSAL HEAD EXCISION Left 01/10/2021   Procedure: METATARSAL HEAD EXCISION - LEFT 5th;  Surgeon: Rosetta Posner, DPM;  Location: [facility];  Service: Podiatry;  Laterality: Left;   PERIPHERAL VASCULAR CATHETERIZATION Right 01/24/2016   Procedure: Lower Extremity Angiography;  Surgeon: Renford Dills, MD;  Location: [facility];  Service: Cardiovascular;  Laterality: Right;   PERIPHERAL VASCULAR CATHETERIZATION Right 01/25/2016   Procedure: Lower Extremity Angiography;  Surgeon: Renford Dills, MD;  Location: [facility];  Service: Cardiovascular;  Laterality: Right;   PORTACATH PLACEMENT [name] 03/18/2023   Procedure: INSERTION PORT-A-CATH;  Surgeon: Carolan Shiver, MD;  Location: [facility];  Service: General;  Laterality: [name];   TOE AMPUTATION     TONSILLECTOMY      SOCIAL HISTORY: Social History   Socioeconomic History   Marital status: Married    Spouse name: Not on file   Number of children: Not on file   Years of education: Not on file   Highest education level: Not on file  Occupational History   Not on file  Tobacco Use   Smoking status: Former    Current packs/day: 0.00    Average packs/day: 0.5 packs/day for 45.0 years (22.5 ttl pk-yrs)    Types: Cigarettes    Start date: 04/04/1970    Quit date: 04/05/2015    Years since quitting: 8.3   Smokeless tobacco: Never  Vaping Use   Vaping status: Every Day  Substance and Sexual Activity   Alcohol use: Not Currently    Alcohol/week: 3.0 standard drinks of alcohol    Types: 3 Glasses of wine per week   Drug use: No   Sexual activity: Not on file  Other Topics Concern   Not on file  Social History Narrative   Not on file   Social Drivers of Health   Financial Resource Strain: Low Risk  (09/28/2022)    Received [facility]   Overall Financial Resource Strain (CARDIA)    Difficulty of Paying Living Expenses: Not hard at all  Food Insecurity: No Food Insecurity (06/29/2023)   Hunger Vital Sign    Worried About Running Out of Food in the Last Year: Never true    Ran Out of Food in the Last Year: Never true  Transportation Needs: No Transportation Needs (06/29/2023)   PRAPARE - Administrator, Civil Service (Medical): No    Lack of Transportation (Non-Medical): No  Physical Activity: Not on file  Stress: Not on file  Social Connections: Socially Isolated (06/29/2023)   Social Connection and Isolation Panel [NHANES]    Frequency of Communication with Friends and Family: Never    Frequency of Social Gatherings with Friends and Family: Never    Attends Religious Services: Never    Database administrator or Organizations: No    Attends Banker Meetings: Never    Marital Status: Married  Catering manager  Violence: Patient Unable To Answer (06/29/2023)   Humiliation, Afraid, Rape, and Kick questionnaire    Fear of Current or Ex-Partner: Patient unable to answer    Emotionally Abused: Patient unable to answer    Physically Abused: Patient unable to answer    Sexually Abused: Patient unable to answer    FAMILY HISTORY: Family History  Problem Relation Age of Onset   Hypertension Father    Coronary artery disease Father    Alcohol abuse Father    Heart failure Brother     ALLERGIES:  is allergic to penicillins, statins, and metformin and related.  MEDICATIONS:  Current Outpatient Medications  Medication Sig Dispense Refill   acetaminophen (TYLENOL) 325 MG tablet Place 2 tablets (650 mg total) into feeding tube every 6 (six) hours as needed for mild pain (pain score 1-3) (or Fever >/= 101).     amiodarone (PACERONE) 200 MG tablet Take twice a day through 1/7 and then once daily (Patient taking differently: Take 200 mg by mouth daily.)      apixaban (ELIQUIS) 5 MG TABS tablet Place 1 tablet (5 mg total) into feeding tube 2 (two) times daily.     ascorbic acid (VITAMIN C) 500 MG tablet Place 1 tablet (500 mg total) into feeding tube daily.     Cholecalciferol 25 MCG (1000 UT) tablet Place 1 tablet (1,000 Units total) into feeding tube daily.     clonazePAM (KLONOPIN) 0.5 MG tablet Place 1 tablet (0.5 mg total) into feeding tube 2 (two) times daily.     Cyanocobalamin (VITAMIN B-12) 5000 MCG TBDP Place 10,000 mcg into feeding tube daily.     diltiazem (CARDIZEM CD) 300 MG 24 hr capsule Take 300 mg by mouth daily.     diphenoxylate-atropine (LOMOTIL) 2.5-0.025 MG tablet Place 1 tablet into feeding tube 4 (four) times daily.     doxycycline (VIBRAMYCIN) 100 MG capsule Take 100 mg by mouth 2 (two) times daily.     ezetimibe (ZETIA) 10 MG tablet Place 1 tablet (10 mg total) into feeding tube at bedtime.     ferrous sulfate 300 (60 Fe) MG/5ML syrup Place 5 mLs (300 mg total) into feeding tube daily. (Patient taking differently: Place 6.8 mLs into feeding tube daily.) 150 mL 0   fiber supplement, BANATROL TF, liquid Place 60 mLs into feeding tube 2 (two) times daily.     finasteride (PROSCAR) 5 MG tablet Take 1 tablet by mouth daily.     gabapentin (NEURONTIN) 300 MG capsule Take 1 capsule by mouth at bedtime.     HUMALOG 100 UNIT/ML injection Inject 0-14 Units into the skin 3 (three) times daily with meals. Sliding scale     Infant Care Products (DERMACLOUD) OINT Apply 1 Application topically daily as needed.     insulin glargine (LANTUS) 100 UNIT/ML injection Inject 40 Units into the skin 2 (two) times daily.     levothyroxine (SYNTHROID) 25 MCG tablet Place 1 tablet (25 mcg total) into feeding tube daily at 6 (six) AM.     lidocaine-prilocaine (EMLA) cream Apply 1 Application topically as needed. 30 g 0   loperamide HCl (IMODIUM) 1 MG/7.5ML suspension Place 15 mLs (2 mg total) into feeding tube every 6 (six) hours as needed for diarrhea or  loose stools.     Multiple Vitamins-Minerals (MULTIVITAMIN WITH MINERALS) tablet Take 1 tablet by mouth daily.     nitrofurantoin, macrocrystal-monohydrate, (MACROBID) 100 MG capsule Take 100 mg by mouth daily.     nitroGLYCERIN (NITROSTAT)  0.4 MG SL tablet Place 1 tablet (0.4 mg total) under the tongue every 5 (five) minutes as needed for chest pain. 25 tablet 3   Nutritional Supplements (FEEDING SUPPLEMENT, NEPRO CARB STEADY,) LIQD Place 237 mLs into feeding tube 6 (six) times daily.     nystatin (MYCOSTATIN/NYSTOP) powder Apply 1 Application topically 2 (two) times daily. Under belly     ondansetron (ZOFRAN) 4 MG tablet Place 1 tablet (4 mg total) into feeding tube every 8 (eight) hours as needed for nausea.     polyethylene glycol (MIRALAX / GLYCOLAX) 17 g packet Place 17 g into feeding tube daily as needed for moderate constipation.     pramipexole (MIRAPEX) 1 MG tablet Place 2 tablets (2 mg total) into feeding tube at bedtime.     primidone (MYSOLINE) 250 MG tablet Place 1 tablet (250 mg total) into feeding tube 2 (two) times daily.     tamsulosin (FLOMAX) 0.4 MG CAPS capsule Take 0.4 mg by mouth.     Vitamins A & D (VITAMIN A & D) ointment Apply 1 Application topically as needed for dry skin. Apply to stumps     Water For Irrigation, Sterile (FREE WATER) SOLN Place 80 mLs into feeding tube 6 (six) times daily.     zinc sulfate, 50mg  elemental zinc, 220 (50 Zn) MG capsule Take 220 mg by mouth daily.     [Paused] furosemide (LASIX) 40 MG tablet Take 40 mg by mouth daily. (Patient not taking: Reported on 07/22/2023)     [Paused] isosorbide mononitrate (IMDUR) 60 MG 24 hr tablet Take 60 mg by mouth in the morning and at bedtime. (Patient not taking: Reported on 07/22/2023)     [Paused] losartan (COZAAR) 50 MG tablet Take 50 mg by mouth daily. (Patient not taking: Reported on 07/22/2023)     No current facility-administered medications for this visit.    PHYSICAL EXAMINATION:   Vitals:    07/22/23 1317  BP: 98/60  Pulse: 90  Resp: 14  Temp: (!) 97 F (36.1 C)  SpO2: 95%    There were no vitals filed for this visit.  Foley catheter in place.  Physical Exam Vitals and nursing note reviewed.  HENT:     Head: Normocephalic and atraumatic.     Mouth/Throat:     Pharynx: Oropharynx is clear.  Eyes:     Extraocular Movements: Extraocular movements intact.     Pupils: Pupils are equal, round, and reactive to light.  Cardiovascular:     Rate and Rhythm: Normal rate and regular rhythm.  Pulmonary:     Comments: Decreased breath sounds bilaterally.  Abdominal:     Palpations: Abdomen is soft.  Musculoskeletal:     Cervical back: Normal range of motion.     Comments: Bil LE BKA.   Skin:    General: Skin is warm.  Neurological:     General: No focal deficit present.     Mental Status: He is alert and oriented to person, place, and time.  Psychiatric:        Behavior: Behavior normal.        Judgment: Judgment normal.     LABORATORY DATA:  I have reviewed the data as listed Lab Results  Component Value Date   WBC 8.7 07/22/2023   HGB 10.1 (L) 07/22/2023   HCT 32.8 (L) 07/22/2023   MCV 94.3 07/22/2023   PLT 258 07/22/2023   Recent Labs    06/29/23 1142 06/29/23 1824 07/08/23 0503 07/15/23 1549 07/22/23  1249  NA 134*   < > 141 134* 129*  K 7.4*   < > 3.6 4.3 4.0  CL 97*   < > 106 96* 92*  CO2 30   < > 28 30 26   GLUCOSE 122*   < > 147* 226* 238*  BUN 83*   < > 18 35* 32*  CREATININE 1.39*   < > 0.82 0.85 0.93  CALCIUM 9.3   < > 8.5* 9.2 8.4*  GFRNONAA 55*   < > >60 >60 >60  PROT 6.8  --   --  6.7 7.0  ALBUMIN 2.8*  --   --  2.6* 2.7*  AST 15  --   --  28 18  ALT 17  --   --  16 19  ALKPHOS 122  --   --  106 109  BILITOT 0.4  --   --  0.5 0.3   < > = values in this interval not displayed.    RADIOGRAPHIC STUDIES: I have personally reviewed the radiological images as listed and agreed with the findings in the report. DG Chest Portable 1  View Result Date: 07/15/2023 CLINICAL DATA:  Shortness of breath, acute onset. History of esophageal cancer. Recent admission for pneumonia EXAM: PORTABLE CHEST 1 VIEW COMPARISON:  X-ray 07/01/2023 FINDINGS: Right IJ chest port in place with tip along the upper SVC. Sternal wires. The uppermost sternal wire is fractured. Stable cardiopericardial silhouette with tortuous ectatic aorta. Slight widening of the mediastinum. Overlapping cardiac leads. No pneumothorax, effusion or edema. Degenerative changes along the spine. Film is under penetrated. IMPRESSION: Postop chest with chest port. Stable tortuous ectatic aorta and widened mediastinum. No consolidation. Electronically Signed   By: Karen Kays M.D.   On: 07/15/2023 17:04   DG Chest Port 1 View Result Date: 07/01/2023 CLINICAL DATA:  Shortness of breath EXAM: PORTABLE CHEST 1 VIEW COMPARISON:  06/29/2023 FINDINGS: Cardiac shadow is stable. Endotracheal tube and gastric catheter are noted in satisfactory position. Right chest wall port is seen. Postsurgical changes are noted. Small effusion is noted on the right. The previously seen parenchymal opacity has resolved and was likely related atelectasis. IMPRESSION: Resolution of previously seen basilar opacity. Right-sided effusion is noted. Electronically Signed   By: Alcide Clever M.D.   On: 07/01/2023 00:39   DG Abd 1 View Result Date: 06/29/2023 CLINICAL DATA:  Feeding tube placement. EXAM: ABDOMEN - 1 VIEW COMPARISON:  Earlier today. FINDINGS: Tip of the enteric tube is just beyond the gastroesophageal junction, without significant change from earlier today. Side port is not well-defined on the current exam. No bowel dilatation in the included upper abdomen. IMPRESSION: Tip of the enteric tube just beyond the gastroesophageal junction, unchanged. The side-port is not well visualized. Electronically Signed   By: Narda Rutherford M.D.   On: 06/29/2023 20:23   DG Abd 1 View Result Date: 06/29/2023 CLINICAL  DATA:  OG tube placement EXAM: ABDOMEN - 1 VIEW COMPARISON:  06/29/2023 FINDINGS: Enteric tube tip just distal to GE junction region. Bibasilar airspace disease. Upper gas pattern is unremarkable IMPRESSION: Enteric tube tip just distal to GE junction region, consider further advancement for more optimal positioning Electronically Signed   By: Jasmine Pang M.D.   On: 06/29/2023 16:27   CT Head Wo Contrast Result Date: 06/29/2023 CLINICAL DATA:  Head trauma EXAM: CT HEAD WITHOUT CONTRAST TECHNIQUE: Contiguous axial images were obtained from the base of the skull through the vertex without intravenous contrast. RADIATION DOSE REDUCTION:  This exam was performed according to the departmental dose-optimization program which includes automated exposure control, adjustment of the mA and/or kV according to patient size and/or use of iterative reconstruction technique. COMPARISON:  CT brain 02/11/2022, brain MRI 04/15/2023 FINDINGS: Brain: No acute territorial infarction, hemorrhage or intracranial mass. Mild atrophy and minimal chronic small vessel ischemic changes of the white matter. Ventricles are nonenlarged Vascular: No hyperdense vessels.  Carotid vascular calcification Skull: Normal. Negative for fracture or focal lesion. Sinuses/Orbits: Mucosal thickening in the sinuses. Small fluid level right maxillary sinus. Moderate fluid and debris in the nasopharynx. Other: None IMPRESSION: 1. No CT evidence for acute intracranial abnormality. 2. Mild atrophy and minimal chronic small vessel ischemic changes of the white matter Electronically Signed   By: Jasmine Pang M.D.   On: 06/29/2023 16:26   DG Abdomen 1 View Result Date: 06/29/2023 CLINICAL DATA:  Evaluate OG tube placement EXAM: ABDOMEN - 1 VIEW COMPARISON:  06/14/2023 FINDINGS: Enteric tube is identified with tip in just below the level of the GE junction. The side port is at the expected location of the GE junction. Recommend further advancing the tube by  approximately 4-5 cm. Bowel gas pattern is nonspecific. No dilated loops of large or small bowel. IMPRESSION: Enteric tube tip is just below the level of the GE junction. Recommend further advancing the tube by approximately 4-5 cm. Electronically Signed   By: Signa Kell M.D.   On: 06/29/2023 12:34   DG Chest Port 1 View Result Date: 06/29/2023 CLINICAL DATA:  Intubation EXAM: PORTABLE CHEST 1 VIEW COMPARISON:  06/13/2023 FINDINGS: Endotracheal intubation, tube tip over the midtrachea. Right chest port catheter. Cardiomegaly status post median sternotomy. Heterogeneous airspace opacity and consolidation of the right lung base. Left lung is normally aerated. No acute osseous findings. IMPRESSION: 1. Endotracheal intubation, tube tip over the midtrachea. 2. Heterogeneous airspace opacity and consolidation of the right lung base, consistent with infection or aspiration. 3. Cardiomegaly. Electronically Signed   By: Jearld Lesch M.D.   On: 06/29/2023 12:15     Malignant neoplasm of lower third of esophagus (HCC) # [id number]- stage IV- PET scan JAN 2025-  Distal esophageal cancer with extension into the proximal stomach and metastatic high left paratracheal and gastrohepatic ligament lymph nodes; no evidence of distant metastatic disease. Unresectable. NO chemotherapy as patient is a poor candidate for any systemic therapy at this time [given high risk of complications-recent multiple admission to hospital-UTI; acute renal failure; hypoglycemic; C. difficile colitis; and also the logistics/ transportation]. Currently on palliative radiation.    # awaiting to finish RT- on 3/26- recommend repeating PET scan in 2-3 months. Ordered today-  Given the issues- poor candidate for systemic therapy; however will decide after the PET scan.    # Diarrhea alternating with constipation- recommend stopping Anti-diarrheal - and if not improved- then recommend miralax prn.    # CAD/A.fib on eliquis- stable.   # Right  Kidney hematoma [Dr.Brandon]- 2024-Retained s/p Foley catheter   # History of chronic UTI-[history of nitrofurantoin prophylaxis-Dr. Sparks]    # Poorly controlled diabetes [Dr. Sofie Rower insulin/status post PEG tube- currently 238-; soidum- 129- recommend Zero gatorade twice a day-    # Hx C. difficile colitis- s/p  oral vancomycin- resolved.    #  PVD/ BKA  2-56m fu # DISPOSITION: # follow up in 1st week of JUNE 2025- MD; port-labs- cbc/cmp; PET scan prior Dr.B   Above plan of care was discussed with patient/family in detail.  My  contact information was given to the patient/family.     [name], MD 07/22/2023 2:51 PM

## 2023-07-22 NOTE — Assessment & Plan Note (Addendum)
#   [id number]- stage IV- PET scan JAN 2025-  Distal esophageal cancer with extension into the proximal stomach and metastatic high left paratracheal and gastrohepatic ligament lymph nodes; no evidence of distant metastatic disease. Unresectable. NO chemotherapy as patient is a poor candidate for any systemic therapy at this time [given high risk of complications-recent multiple admission to hospital-UTI; acute renal failure; hypoglycemic; C. difficile colitis; and also the logistics/ transportation]. Currently on palliative radiation.    # awaiting to finish RT- on 3/26- recommend repeating PET scan in 2-3 months. Ordered today-  Given the issues- poor candidate for systemic therapy; however will decide after the PET scan.    # Diarrhea alternating with constipation- recommend stopping Anti-diarrheal - and if not improved- then recommend miralax prn.    # CAD/A.fib on eliquis- stable.   # Right Kidney hematoma [Dr.Brandon]- 2024-Retained s/p Foley catheter   # History of chronic UTI-[history of nitrofurantoin prophylaxis-Dr. Sparks]    # Poorly controlled diabetes [Dr. Sofie Rower insulin/status post PEG tube- currently 238-; soidum- 129- recommend Zero gatorade twice a day-    # Hx C. difficile colitis- s/p  oral vancomycin- resolved.    #  PVD/ BKA  2-84m fu # DISPOSITION: # follow up in 1st week of JUNE 2025- MD; port-labs- cbc/cmp; PET scan prior Dr.B

## 2023-07-23 ENCOUNTER — Ambulatory Visit

## 2023-07-23 ENCOUNTER — Inpatient Hospital Stay

## 2023-07-23 NOTE — Progress Notes (Signed)
 [facility] CSW Progress Note  Visual merchandiser contacted caregiver by phone, [name], to assess needs per the request of Abelardo Diesel.  She expressed feelings of caregiver stress.  She herself is a cancer survivor.  She appeared to respond positively to life review.  Patient does have home care which has helped decrease [name]'s stress.  CSW provided active listening and supportive counseling.    Dorothey Baseman, LCSW Clinical Social Worker [facility]

## 2023-07-23 NOTE — Progress Notes (Signed)
 Nutrition  Unable to reach spouse, [name] for phone nutrition follow-up visit.  Left message with call back number.   Elyana Grabski B. Elease Hashimoto, CSO, LDN Registered Dietitian [phone]

## 2023-07-24 ENCOUNTER — Ambulatory Visit

## 2023-07-24 ENCOUNTER — Ambulatory Visit
Admission: RE | Admit: 2023-07-24 | Discharge: 2023-07-24 | Disposition: A | Discharge status: Home / Self Care | Source: Ambulatory Visit | Attending: Radiation Oncology | Admitting: Radiation Oncology

## 2023-07-24 ENCOUNTER — Other Ambulatory Visit: Payer: Self-pay

## 2023-07-24 DIAGNOSIS — C155 Malignant neoplasm of lower third of esophagus: Secondary | ICD-10-CM | POA: Diagnosis not present

## 2023-07-24 LAB — RAD ONC ARIA SESSION SUMMARY
Course Elapsed Days: 57
Plan Fractions Treated to Date: 24
Plan Prescribed Dose Per Fraction: 2 Gy
Plan Total Fractions Prescribed: 25
Plan Total Prescribed Dose: 50 Gy
Reference Point Dosage Given to Date: 48 Gy
Reference Point Session Dosage Given: 2 Gy
Session Number: 24

## 2023-07-25 ENCOUNTER — Ambulatory Visit (INDEPENDENT_AMBULATORY_CARE_PROVIDER_SITE_OTHER): Admitting: Physician Assistant

## 2023-07-25 ENCOUNTER — Ambulatory Visit: Admitting: Physician Assistant

## 2023-07-25 ENCOUNTER — Ambulatory Visit

## 2023-07-25 VITALS — BP 120/76 | HR 116

## 2023-07-25 DIAGNOSIS — R339 Retention of urine, unspecified: Secondary | ICD-10-CM | POA: Diagnosis not present

## 2023-07-25 DIAGNOSIS — N368 Other specified disorders of urethra: Secondary | ICD-10-CM

## 2023-07-25 DIAGNOSIS — T8389XA Other specified complication of genitourinary prosthetic devices, implants and grafts, initial encounter: Secondary | ICD-10-CM

## 2023-07-25 LAB — BLADDER SCAN AMB NON-IMAGING: Scan Result: 314

## 2023-07-25 MED ORDER — SULFAMETHOXAZOLE-TRIMETHOPRIM 800-160 MG PO TABS
1.0000 | ORAL_TABLET | Freq: Once | ORAL | Status: AC
  Administered 2023-07-25: 1 via ORAL

## 2023-07-25 MED ORDER — SULFAMETHOXAZOLE-TRIMETHOPRIM 800-160 MG PO TABS: 1.0000 | ORAL_TABLET | Freq: Once | ORAL | Status: DC

## 2023-07-25 NOTE — Progress Notes (Signed)
 07/25/2023 10:27 AM   [name]. [birth year] [id number]  CC: Chief Complaint  Patient presents with   Urinary Retention    Voiding trial   HPI: [name]. is a 68 y.o. male with PMH chronic right perinephric hematoma, esophageal cancer s/p chemo rads with PEG tube, and recurrent urinary retention who passed an outpatient voiding trial with me earlier this year but ultimately had Foley replaced during a subsequent admission with sepsis and acute respiratory failure due to aspiration pneumonia who presents today for voiding trial.   History today is provided by his wife via telephone.  She believes Foley catheter was placed in early March for urinary management versus I/O tracking in the setting of respiratory failure requiring mechanical ventilation.  He did not have an inpatient voiding trial.  She has noticed some tearing of the penis due to his catheter.  Foley catheter removed in the morning, see procedure note below. He returned to clinic in the afternoon for PVR. He had not voided, nor felt the urge to do so. Bladder scan . He was unable to void when prompted.  PMH: Past Medical History:  Diagnosis Date   Arrhythmia    atrial fibrillation   Asthma    CHF (congestive heart failure) (HCC)    COPD (chronic obstructive pulmonary disease) (HCC)    Coronary artery disease    Depression    Diabetes mellitus without complication (HCC)    Gout    History anabolic steroid use    Hyperlipidemia    Hypertension    Hypogonadism in male    MI (myocardial infarction) (HCC)    Morbid obesity (HCC)    Myocardial infarction (HCC)    Peripheral vascular disease (HCC)    Perirectal abscess    Pleurisy    Sleep apnea    CPAP at night, no oxygen   Varicella     Surgical History: Past Surgical History:  Procedure Laterality Date   ABDOMINAL AORTIC ANEURYSM REPAIR     ACHILLES TENDON SURGERY Left 01/10/2021   Procedure: ACHILLES LENGTHENING/KIDNER;   Surgeon: Rosetta Posner, DPM;  Location: [facility];  Service: Podiatry;  Laterality: Left;   AMPUTATION Left 10/03/2021   Procedure: AMPUTATION BELOW KNEE;  Surgeon: Renford Dills, MD;  Location: [facility];  Service: Vascular;  Laterality: Left;   AMPUTATION Right 05/24/2022   Procedure: AMPUTATION BELOW KNEE;  Surgeon: Annice Needy, MD;  Location: [facility];  Service: General;  Laterality: Right;   AMPUTATION TOE Right 02/10/2016   Procedure: AMPUTATION TOE 3RD TOE;  Surgeon: Gwyneth Revels, DPM;  Location: [facility];  Service: Podiatry;  Laterality: Right;   AMPUTATION TOE Left 02/24/2020   Procedure: AMPUTATION TOE;  Surgeon: Rosetta Posner, DPM;  Location: [facility];  Service: Podiatry;  Laterality: Left;   APPLICATION OF WOUND VAC Left 02/29/2020   Procedure: APPLICATION OF WOUND VAC;  Surgeon: Rosetta Posner, DPM;  Location: [facility];  Service: Podiatry;  Laterality: Left;   BIOPSY  03/15/2023   Procedure: BIOPSY;  Surgeon: Wyline Mood, MD;  Location: [facility] ENDOSCOPY;  Service: Gastroenterology;;   BIOPSY  04/10/2023   Procedure: BIOPSY;  Surgeon: Norma Fredrickson, Boykin Nearing, MD;  Location: [facility] ENDOSCOPY;  Service: Gastroenterology;;   CARDIAC CATHETERIZATION     CARDIOVERSION [name] 02/13/2022   Procedure: CARDIOVERSION;  Surgeon: Lamar Blinks, MD;  Location: [facility];  Service: Cardiovascular;  Laterality: [name];   CARDIOVERSION [name] 04/23/2023   Procedure: CARDIOVERSION;  Surgeon: Antonieta Iba, MD;  Location: [facility];  Service: Cardiovascular;  Laterality: [name];   COLONOSCOPY WITH PROPOFOL [name] 11/18/2015   Procedure: COLONOSCOPY WITH PROPOFOL;  Surgeon: Scot Jun, MD;  Location[facility] ENDOSCOPY;  Service: Endoscopy;  Laterality: [name];   CORONARY ARTERY BYPASS GRAFT     CORONARY STENT INTERVENTION [name] 02/02/2020   Procedure: CORONARY STENT INTERVENTION;  Surgeon: Marcina Millard, MD;  Location: [facility] IN[facility];  Service: Cardiovascular;  Laterality: [name];    ESOPHAGOGASTRODUODENOSCOPY (EGD) WITH PROPOFOL [name] 03/15/2023   Procedure: ESOPHAGOGASTRODUODENOSCOPY (EGD) WITH PROPOFOL;  Surgeon: Wyline Mood, MD;  Location: [facility] ENDOSCOPY;  Service: Gastroenterology;  Laterality: [name];   FLEXIBLE SIGMOIDOSCOPY [name] 04/10/2023   Procedure: FLEXIBLE SIGMOIDOSCOPY;  Surgeon: Toledo, Boykin Nearing, MD;  Location: [facility] ENDOSCOPY;  Service: Gastroenterology;  Laterality: [name];   GASTROSTOMY [name] 03/18/2023   Procedure: INSERTION OF GASTROSTOMY TUBE;  Surgeon: Carolan Shiver, MD;  Location: [facility];  Service: General;  Laterality: [name];   IMPACTION REMOVAL  03/15/2023   Procedure: IMPACTION REMOVAL;  Surgeon: Wyline Mood, MD;  Location: [facility] ENDOSCOPY;  Service: Gastroenterology;;   INCISION AND DRAINAGE Left 08/07/2021   Procedure: INCISION AND DRAINAGE-Partial Calcanectomy;  Surgeon: Rosetta Posner, DPM;  Location: [facility];  Service: Podiatry;  Laterality: Left;   IRRIGATION AND DEBRIDEMENT FOOT Left 02/29/2020   Procedure: IRRIGATION AND DEBRIDEMENT FOOT;  Surgeon: Rosetta Posner, DPM;  Location: [facility];  Service: Podiatry;  Laterality: Left;   IRRIGATION AND DEBRIDEMENT FOOT Left 02/24/2020   Procedure: IRRIGATION AND DEBRIDEMENT FOOT;  Surgeon: Rosetta Posner, DPM;  Location: [facility];  Service: Podiatry;  Laterality: Left;   KNEE ARTHROSCOPY     LEFT HEART CATH AND CORS/GRAFTS ANGIOGRAPHY [name] 02/02/2020   Procedure: LEFT HEART CATH AND CORS/GRAFTS ANGIOGRAPHY;  Surgeon: Dalia Heading, MD;  Location: [facility];  Service: Cardiovascular;  Laterality: [name];   LOWER EXTREMITY ANGIOGRAPHY Left 02/25/2020   Procedure: Lower Extremity Angiography;  Surgeon: Annice Needy, MD;  Location: [facility];  Service: Cardiovascular;  Laterality: Left;   LOWER EXTREMITY ANGIOGRAPHY Left 01/04/2021   Procedure: LOWER EXTREMITY ANGIOGRAPHY;  Surgeon: Annice Needy, MD;  Location: [facility];  Service: Cardiovascular;  Laterality: Left;   LOWER  EXTREMITY ANGIOGRAPHY Right 05/21/2022   Procedure: Lower Extremity Angiography;  Surgeon: Annice Needy, MD;  Location: [facility];  Service: Cardiovascular;  Laterality: Right;   METATARSAL HEAD EXCISION Left 01/10/2021   Procedure: METATARSAL HEAD EXCISION - LEFT 5th;  Surgeon: Rosetta Posner, DPM;  Location: [facility];  Service: Podiatry;  Laterality: Left;   PERIPHERAL VASCULAR CATHETERIZATION Right 01/24/2016   Procedure: Lower Extremity Angiography;  Surgeon: Renford Dills, MD;  Location: [facility];  Service: Cardiovascular;  Laterality: Right;   PERIPHERAL VASCULAR CATHETERIZATION Right 01/25/2016   Procedure: Lower Extremity Angiography;  Surgeon: Renford Dills, MD;  Location: [facility];  Service: Cardiovascular;  Laterality: Right;   PORTACATH PLACEMENT [name] 03/18/2023   Procedure: INSERTION PORT-A-CATH;  Surgeon: Carolan Shiver, MD;  Location: [facility];  Service: General;  Laterality: [name];   TOE AMPUTATION     TONSILLECTOMY      Home Medications:  Allergies as of 07/25/2023       Reactions   Penicillins Other (See Comments)   Happened at 68 years old and pt. stated he passed out  He has tolerated amoxicillin/clavulanate and ampicillin/sulbactam   Statins    Other reaction(s): Muscle Pain Causes legs to ache per pt  Metformin And Related Diarrhea        Medication List        Accurate as of July 25, 2023 10:27 AM. If you have any questions, ask your nurse or doctor.          PAUSE taking these medications    furosemide 40 MG tablet Wait to take this until your doctor or other care provider tells you to start again. Commonly known as: LASIX Take 40 mg by mouth daily.   isosorbide mononitrate 60 MG 24 hr tablet Wait to take this until your doctor or other care provider tells you to start again. Commonly known as: IMDUR Take 60 mg by mouth in the morning and at bedtime.   losartan 50 MG tablet Wait to take this until  your doctor or other care provider tells you to start again. Commonly known as: COZAAR Take 50 mg by mouth daily.       STOP taking these medications    doxycycline 100 MG capsule Commonly known as: VIBRAMYCIN Stopped by: Carman Ching       TAKE these medications    acetaminophen 325 MG tablet Commonly known as: TYLENOL Place 2 tablets (650 mg total) into feeding tube every 6 (six) hours as needed for mild pain (pain score 1-3) (or Fever >/= 101).   amiodarone 200 MG tablet Commonly known as: PACERONE Take twice a day through 1/7 and then once daily What changed:  how much to take how to take this when to take this additional instructions   apixaban 5 MG Tabs tablet Commonly known as: Eliquis Place 1 tablet (5 mg total) into feeding tube 2 (two) times daily.   ascorbic acid 500 MG tablet Commonly known as: VITAMIN C Place 1 tablet (500 mg total) into feeding tube daily.   Cholecalciferol 25 MCG (1000 UT) tablet Place 1 tablet (1,000 Units total) into feeding tube daily.   clonazePAM 0.5 MG tablet Commonly known as: KLONOPIN Place 1 tablet (0.5 mg total) into feeding tube 2 (two) times daily.   Dermacloud Oint Apply 1 Application topically daily as needed.   diltiazem 300 MG 24 hr capsule Commonly known as: CARDIZEM CD Take 300 mg by mouth daily.   diphenoxylate-atropine 2.5-0.025 MG tablet Commonly known as: LOMOTIL Place 1 tablet into feeding tube 4 (four) times daily.   ezetimibe 10 MG tablet Commonly known as: ZETIA Place 1 tablet (10 mg total) into feeding tube at bedtime.   feeding supplement (NEPRO CARB STEADY) Liqd Place 237 mLs into feeding tube 6 (six) times daily.   ferrous sulfate 300 (60 Fe) MG/5ML syrup Place 5 mLs (300 mg total) into feeding tube daily. What changed: how much to take   fiber supplement (BANATROL TF) liquid Place 60 mLs into feeding tube 2 (two) times daily.   finasteride 5 MG tablet Commonly known as:  PROSCAR Take 1 tablet by mouth daily.   free water Soln Place 80 mLs into feeding tube 6 (six) times daily.   gabapentin 300 MG capsule Commonly known as: NEURONTIN Take 1 capsule by mouth at bedtime.   HumaLOG 100 UNIT/ML injection Generic drug: insulin lispro Inject 0-14 Units into the skin 3 (three) times daily with meals. Sliding scale   Lantus 100 UNIT/ML injection Generic drug: insulin glargine Inject 40 Units into the skin 2 (two) times daily.   levothyroxine 25 MCG tablet Commonly known as: SYNTHROID Place 1 tablet (25 mcg total) into feeding tube daily at 6 (six) AM.  lidocaine-prilocaine cream Commonly known as: EMLA Apply 1 Application topically as needed.   loperamide HCl 1 MG/7.5ML suspension Commonly known as: IMODIUM Place 15 mLs (2 mg total) into feeding tube every 6 (six) hours as needed for diarrhea or loose stools.   multivitamin with minerals tablet Take 1 tablet by mouth daily.   nitrofurantoin (macrocrystal-monohydrate) 100 MG capsule Commonly known as: MACROBID Take 100 mg by mouth daily.   nitroGLYCERIN 0.4 MG SL tablet Commonly known as: NITROSTAT Place 1 tablet (0.4 mg total) under the tongue every 5 (five) minutes as needed for chest pain.   nystatin powder Commonly known as: MYCOSTATIN/NYSTOP Apply 1 Application topically 2 (two) times daily. Under belly   ondansetron 4 MG tablet Commonly known as: ZOFRAN Place 1 tablet (4 mg total) into feeding tube every 8 (eight) hours as needed for nausea.   polyethylene glycol 17 g packet Commonly known as: MIRALAX / GLYCOLAX Place 17 g into feeding tube daily as needed for moderate constipation.   pramipexole 1 MG tablet Commonly known as: MIRAPEX Place 2 tablets (2 mg total) into feeding tube at bedtime.   primidone 250 MG tablet Commonly known as: MYSOLINE Place 1 tablet (250 mg total) into feeding tube 2 (two) times daily.   tamsulosin 0.4 MG Caps capsule Commonly known as:  FLOMAX Take 0.4 mg by mouth.   vitamin A & D ointment Apply 1 Application topically as needed for dry skin. Apply to stumps   Vitamin B-12 5000 MCG Tbdp Place 10,000 mcg into feeding tube daily.   zinc sulfate (50mg  elemental zinc) 220 (50 Zn) MG capsule Take 220 mg by mouth daily.        Allergies:  Allergies  Allergen Reactions   Penicillins Other (See Comments)    Happened at 68 years old and pt. stated he passed out  He has tolerated amoxicillin/clavulanate and ampicillin/sulbactam   Statins     Other reaction(s): Muscle Pain Causes legs to ache per pt   Metformin And Related Diarrhea    Family History: Family History  Problem Relation Age of Onset   Hypertension Father    Coronary artery disease Father    Alcohol abuse Father    Heart failure Brother     Social History:   reports that he quit smoking about 8 years ago. His smoking use included cigarettes. He started smoking about 53 years ago. He has a 22.5 pack-year smoking history. He has never used smokeless tobacco. He reports that he does not currently use alcohol after a past usage of about 3.0 standard drinks of alcohol per week. He reports that he does not use drugs.  Physical Exam: BP 120/76   Pulse (!) 116   Constitutional:  Alert, no acute distress, nontoxic appearing HEENT: [place], AT Cardiovascular: No clubbing, cyanosis, or edema Respiratory: Normal respiratory effort, no increased work of breathing GU: Catheter erosion Skin: No rashes, bruises or suspicious lesions Neurologic: Grossly intact, no focal deficits, moving all 4 extremities Psychiatric: Normal mood and affect  Laboratory Data: Results for orders placed or performed in visit on 07/25/23  Bladder Scan (Post Void Residual) in office   Collection Time: 07/25/23  3:18 PM  Result Value Ref Range   Scan Result 314 ml    Catheter Removal  Patient is present today for a catheter removal.  9ml of water was drained from the balloon. A 16FR  foley cath was removed from the bladder, no complications were noted. Patient tolerated well.  Performed by:  Carman Ching, PA-C and [place], CMA  Simple Catheter Placement  Due to urinary retention patient is present today for a foley cath placement.  Patient was cleaned and prepped in a sterile fashion with betadine and 2% lidocaine jelly was instilled into the urethra. A 16 FR coude foley catheter was inserted, urine return was noted  , urine was yellow in color.  The balloon was filled with 10cc of sterile water.  A night bag was attached for drainage. Patient tolerated well, no complications were noted   Performed by: Carman Ching, PA-C , Domingo Cocking, CMA, and Ples Specter, CMA  Assessment & Plan:   1. Urinary retention (Primary) Voiding trial failed.  I offered them Foley catheter replacement and they agreed (wife consented via telephone), see above.  Will plan for repeat voiding trial in 2 weeks. - sulfamethoxazole-trimethoprim (BACTRIM DS) 800-160 MG per tablet 1 tablet - Bladder Scan (Post Void Residual) in office  2. Erosion of urethra due to catheterization of urinary tract, initial encounter [facility]) I discussed with him in person and his wife via telephone that catheter erosion does not resolve, but continued Foley catheterization may make it worse.  If he fails his next voiding trial, I will recommend pursuing SPT placement as an alternative to a urethral catheter.  Return in about 2 weeks (around 08/08/2023) for Voiding trial.  Carman Ching, PA-C  [facility] [address] [place], [place] [address] [phone]

## 2023-07-26 ENCOUNTER — Other Ambulatory Visit: Payer: Self-pay

## 2023-07-26 ENCOUNTER — Ambulatory Visit
Admission: RE | Admit: 2023-07-26 | Discharge: 2023-07-26 | Disposition: A | Discharge status: Home / Self Care | Source: Ambulatory Visit | Attending: Radiation Oncology | Admitting: Radiation Oncology

## 2023-07-26 ENCOUNTER — Ambulatory Visit

## 2023-07-26 DIAGNOSIS — C155 Malignant neoplasm of lower third of esophagus: Secondary | ICD-10-CM | POA: Diagnosis not present

## 2023-07-26 LAB — RAD ONC ARIA SESSION SUMMARY
Course Elapsed Days: 59
Plan Fractions Treated to Date: 25
Plan Prescribed Dose Per Fraction: 2 Gy
Plan Total Fractions Prescribed: 25
Plan Total Prescribed Dose: 50 Gy
Reference Point Dosage Given to Date: 50 Gy
Reference Point Session Dosage Given: 2 Gy
Session Number: 25

## 2023-07-29 NOTE — Radiation Completion Notes (Signed)
 Patient Name: [name], BUYS MRN: [id number] Date of Birth: 1955-10-15 Referring Physician:  , M.D. Date of Service: 2023-07-29 Radiation Oncologist:  , M.D. [facility]                             RADIATION ONCOLOGY END OF TREATMENT NOTE     Diagnosis: C15.9 Malignant neoplasm of esophagus, unspecified Staging on 2023-05-28: Malignant neoplasm of lower third of esophagus (HCC) T=cT4a, N=cN1, M=pM1 Intent: Curative     HPI: Patient is a 68 year old male who presented with dysphagia progressing to inability to tolerate solid foods.  He has a cyst history of CAD A-fib diabetes mellitus with peripheral vascular disease status post bilateral amputations.  He underwent up upper endoscopy showing malignant appearing intrinsic stenosis at the GE junction with a large ulcerating mass with bleeding and stigmata of recent bleeding found at the lower esophagus 40 cm from the incisors.  Mass was obstructing and circumferential.  Biopsy was positive for invasive poorly differentiated adenocarcinoma with signet ring features.  CT scans showed circumferential masslike thickening of the distal esophagus corresponding to reported area of esophageal neoplasm.  There was haziness in the paraesophageal fat which could reflect early local invasion no definitive evidence of metastatic disease was noted.  He is now seen bedside.  He has had a feeding tube placed.      ==========DELIVERED PLANS==========  First Treatment Date: 2023-05-28 Last Treatment Date: 2023-07-26   Plan Name: Esoph Site: Esophagus Technique: IMRT Mode: Photon Dose Per Fraction: 2 Gy Prescribed Dose (Delivered / Prescribed): 50 Gy / 50 Gy Prescribed Fxs (Delivered / Prescribed): 25 / 25     ==========ON TREATMENT VISIT DATES========== 2023-05-28, 2023-05-28, 2023-06-04, 2023-06-12, 2023-06-18, 2023-06-25, 2023-07-17     ==========UPCOMING VISITS========== 09/30/2023 [facility] ON[facility] EST PT  15 [name], MD  09/30/2023 [facility] PORT FLUSH W/LAB [facility]PORT FLUSH  09/24/2023 [facility]PET CT NM PET WB & SB TO MID THIGH [facility] INFUSION  09/17/2023 [facility] OFFICE VISIT [name], MD  09/05/2023 [facility] LAB [facility]MO LAB  08/29/2023 [facility] RAD ONCOLOGY FOLLOW UP 30 Carmina Miller, MD  08/14/2023 [facility] OFFICE VISIT Vanna Scotland, MD  08/08/2023 [facility] OFFICE VISIT Carman Ching, PA-C  08/08/2023 [facility] OFFICE VISIT Carman Ching, PA-C  07/30/2023 [facility] CONSULT Nobie Putnam, MD        ==========APPENDIX - ON TREATMENT VISIT NOTES==========   See weekly On Treatment Notes in Epic for details in the Media tab (listed as Progress notes on the On Treatment Visit Dates listed above).

## 2023-07-29 NOTE — Progress Notes (Signed)
 Electrophysiology Office Note:   Date:  07/30/2023  ID:  Alec Bender., DOB 05/28/1955, MRN [id number]  Primary Cardiologist: Lorine Bears, MD Electrophysiologist: Nobie Putnam, MD      History of Present Illness:   Alec Bender. is a 68 y.o. male with h/o CAD, Afib, CHF, esophageal cancer (s/p chemoradiation), PEG tube, DM, obesity, OSA and bilateral above knee amputations who is being seen today for evaluation of his atrial fibrillation at the request of Carlos Levering, NP.  History of Present Illness Patient recently admitted to [facility] from 06/29/2023 through 07/08/2023.  EMS was called after patient had repeated falls at home with weakness.  He was found to be in respiratory distress and hypoxic requiring bagging by EMS.  In the ED he was intubated.  The wife reported that a few days prior to his hospitalization he had epigastric discomfort and brown foul-smelling discharge around his PEG tube site.  He was found to be rhinovirus positive. He was treated empirically for pneumonia.  Patient has had numerous hospital admissions pertaining to a variety of his medical problems.  He has atrial fibrillation and is currently on amiodarone and Eliquis for management. He has been taking amiodarone for a long time, initially twice a day, but now takes 200 mg once daily in the morning. No recent episodes of heart fluttering, skipping, or racing, although it depends on his activity level. He is also on Eliquis to prevent stroke, which he takes regularly.  He has returned near baseline.  Functional status quite limited. He monitors his blood pressure regularly, and recent readings have been within normal limits, despite being off isosorbide and losartan. He takes a thyroid supplement, levothyroxine, and his last thyroid hormone level was slightly elevated. He is scheduled to follow up with his primary doctor in May to discuss potential adjustments to his thyroid medication.  Review of systems  complete and found to be negative unless listed in HPI.   EP Information / Studies Reviewed:    EKG is not ordered today. EKG from 07/03/23 reviewed which showed sinus tachycardia with RBBB      EKG 02/13/22: AF with RVR   Echo 03/17/23:  1. Left ventricular ejection fraction, by estimation, is 55 %. The left  ventricle has normal function. The left ventricle demonstrates regional  wall motion abnormalities (mild hypokinesis of the basal inferior wall).  There is mild left ventricular  hypertrophy. Left ventricular diastolic parameters are consistent with  Grade I diastolic dysfunction (impaired relaxation). The average left  ventricular global longitudinal strain is -12.0 %.   2. Right ventricular systolic function is normal. The right ventricular  size is normal. Tricuspid regurgitation signal is inadequate for assessing  PA pressure.   3. The mitral valve is normal in structure. No evidence of mitral valve  regurgitation. No evidence of mitral stenosis.   4. The aortic valve is tricuspid. There is mild calcification of the  aortic valve. Aortic valve regurgitation is not visualized. Aortic valve  sclerosis/calcification is present, without any evidence of aortic  stenosis.   5. The inferior vena cava is normal in size with greater than 50%  respiratory variability, suggesting right atrial pressure of 3 mmHg.   Risk Assessment/Calculations:    CHA2DS2-VASc Score = 5   This indicates a 7.2% annual risk of stroke. The patient's score is based upon: CHF History: 1 HTN History: 1 Diabetes History: 1 Stroke History: 0 Vascular Disease History: 1 Age Score: 1 Gender Score: 0  Physical Exam:   VS:  BP 122/62   Pulse 86   Ht 6\' 4"  (1.93 m)   Wt 253 lb (114.8 kg)   SpO2 94%   BMI 30.80 kg/m    Wt Readings from Last 3 Encounters:  07/30/23 253 lb (114.8 kg)  07/08/23 253 lb 4.9 oz (114.9 kg)  06/13/23 240 lb 1.3 oz (108.9 kg)     GEN: Chronically ill  appearing, in no acute distress NECK: No JVD CARDIAC: Normal rate, regular rhythm RESPIRATORY:  Clear to auscultation without rales, wheezing or rhonchi  GU: Foley in place ABDOMEN: Soft, non-distended, PEG in place EXTREMITIES:  No edema; No deformity   ASSESSMENT AND PLAN:    # Persistent atrial fibrillation: Much of patient's recent atrial fibrillation episodes appear to be occurring in the setting of other critical illness such as hyperkalemia, AKI, hypoxic respiratory failure and infections.  Patient is not an ablation candidate due to his numerous comorbidities.  He is currently on amiodarone.  Given his recurrent admissions and propensity for AKI, I do not think changing to Tikosyn would be appropriate.  I believe amiodarone gives him the best chance at present to maintain sinus rhythm.  He appears to have been tolerating this medication for quite some time. -Continue amiodarone 200 mg once daily. -Continue metoprolol 12.5 mg twice daily.  # High risk medication use: LFTs normal on 06/29/2023.  TSH elevated on 07/03/2023 in the setting of acute illness. -Continue amiodarone. -Patient and his wife state that his levothyroxine is managed by his PCP, Dr. Judithann Sheen.  He is scheduled to see him in May  # Secondary hypercoagulable state due to atrial fibrillation: CHA2DS2-VASc score of 5. -Continue Eliquis.  # CAD: S/p CABG x 2 2006, PCI with DES to RCA 2021. SVG to OM is occluded.  Patient denies chest pain. -Continue metoprolol, Zetia, as needed SL NTG.  Not on aspirin secondary to Eliquis.   # Chronic diastolic heart failure: Well compensated on exam today.  Due to presence of diastolic heart failure we will try to maintain sinus rhythm with amiodarone. -Continue metoprolol.     # Hypertension -Blood pressures have been at goal despite being off losartan and isosorbide.  Recommend checking blood pressures 1-2 times per week at home and recording the values.  Recommend bringing these  recordings to the primary care physician.  He had he has an upcoming visit in May.   Follow up with general cardiology, EP as needed.  Signed, Nobie Putnam, MD

## 2023-07-30 ENCOUNTER — Encounter: Payer: Self-pay | Admitting: Cardiology

## 2023-07-30 ENCOUNTER — Ambulatory Visit: Attending: Cardiology | Admitting: Cardiology

## 2023-07-30 VITALS — BP 122/62 | HR 86 | Ht 76.0 in | Wt 253.0 lb

## 2023-07-30 DIAGNOSIS — I4819 Other persistent atrial fibrillation: Secondary | ICD-10-CM

## 2023-07-30 DIAGNOSIS — Z79899 Other long term (current) drug therapy: Secondary | ICD-10-CM | POA: Diagnosis not present

## 2023-07-30 DIAGNOSIS — D6869 Other thrombophilia: Secondary | ICD-10-CM | POA: Diagnosis not present

## 2023-07-30 DIAGNOSIS — I10 Essential (primary) hypertension: Secondary | ICD-10-CM

## 2023-07-30 DIAGNOSIS — I5032 Chronic diastolic (congestive) heart failure: Secondary | ICD-10-CM | POA: Diagnosis not present

## 2023-07-30 NOTE — Patient Instructions (Signed)
 Medication Instructions:  Your physician recommends that you continue on your current medications as directed. Please refer to the Current Medication list given to you today.  *If you need a refill on your cardiac medications before your next appointment, please call your pharmacy*  Follow-Up: At [facility], you and your health needs are our priority.  As part of our continuing mission to provide you with exceptional heart care, our providers are all part of one team.  This team includes your primary Cardiologist (physician) and Advanced Practice Providers or APPs (Physician Assistants and Nurse Practitioners) who all work together to provide you with the care you need, when you need it.  Your next appointment:   Follow up with EP as needed

## 2023-08-07 ENCOUNTER — Ambulatory Visit: Payer: Medicare Other | Admitting: Radiation Oncology

## 2023-08-08 ENCOUNTER — Ambulatory Visit
Admission: RE | Admit: 2023-08-08 | Discharge: 2023-08-08 | Disposition: A | Discharge status: Home / Self Care | Source: Ambulatory Visit | Attending: Urology | Admitting: Urology

## 2023-08-08 ENCOUNTER — Ambulatory Visit: Admitting: Physician Assistant

## 2023-08-08 ENCOUNTER — Telehealth: Payer: Self-pay

## 2023-08-08 DIAGNOSIS — N12 Tubulo-interstitial nephritis, not specified as acute or chronic: Secondary | ICD-10-CM | POA: Diagnosis present

## 2023-08-08 DIAGNOSIS — S37019A Minor contusion of unspecified kidney, initial encounter: Secondary | ICD-10-CM | POA: Insufficient documentation

## 2023-08-08 DIAGNOSIS — R339 Retention of urine, unspecified: Secondary | ICD-10-CM

## 2023-08-08 MED ORDER — IOHEXOL 300 MG/ML  SOLN
100.0000 mL | Freq: Once | INTRAMUSCULAR | Status: AC | PRN
  Administered 2023-08-08: 100 mL via INTRAVENOUS

## 2023-08-08 NOTE — Telephone Encounter (Signed)
 Pts wife [name] states [facility] cx her appt. For this afternoon for a voiding trial.   I am unable to get transportation this late in the day.   Per Sam if pt has not urinated by 10pm or has pain from not being able to pee he needs to be seen in the ED. [name] is aware. She voices understanding.

## 2023-08-08 NOTE — Progress Notes (Signed)
 Catheter Removal  Patient is present today for a catheter removal.  9ml of water was drained from the balloon. A 16FR coude foley cath was removed from the bladder, no complications were noted. Patient tolerated well.  Performed by: Debbe Bales, CMA (AAMA)  Follow up/ Additional notes: RTC this afternoon for PVR.    ADDENDUM: He was scheduled for 2 part voiding trial with me today but transportation did not pick him up for afternoon visit with PVR. We counseled his wife via telephone to call 911 for EMS transport to the ED if he is unable to void by 10pm or if he develops abdominal pain and the urge to void without being able to do so.

## 2023-08-09 NOTE — Telephone Encounter (Signed)
 Wonderful news!  Lets offer him a follow-up in 4 weeks for repeat PVR and symptom recheck.

## 2023-08-09 NOTE — Telephone Encounter (Signed)
 Pt's wife LM on triage line stating that the patient starting voiding spontaneously yesterday evening and has been able to void with no issues since that time.

## 2023-08-13 NOTE — Telephone Encounter (Signed)
 Called pt's wife to offer this appt. Pt wife said that theres no need for this appt and that they already have an appt with Dr.Brandon tomorrow. Pt's wife was informed that this appt was to see if he is emptying well. Pt wife states we can do it tomorrow.

## 2023-08-14 ENCOUNTER — Ambulatory Visit (INDEPENDENT_AMBULATORY_CARE_PROVIDER_SITE_OTHER): Payer: Self-pay | Admitting: Urology

## 2023-08-14 VITALS — BP 125/73 | HR 80

## 2023-08-14 DIAGNOSIS — R339 Retention of urine, unspecified: Secondary | ICD-10-CM

## 2023-08-14 DIAGNOSIS — C159 Malignant neoplasm of esophagus, unspecified: Secondary | ICD-10-CM

## 2023-08-14 DIAGNOSIS — N2889 Other specified disorders of kidney and ureter: Secondary | ICD-10-CM

## 2023-08-14 NOTE — Progress Notes (Signed)
 Marcelle Overlie Plume,acting as a scribe for Vanna Scotland, MD.,have documented all relevant documentation on the behalf of Vanna Scotland, MD,as directed by  Vanna Scotland, MD while in the presence of Vanna Scotland, MD.  08/14/23 3:43 PM   [name]. 1955-12-18 [id number]  Referring provider: [name], MD [address] [facility] [place],  [place] [address]  Chief Complaint  Patient presents with   Follow-up    HPI: 68 year old male with a personal history of stage four esophageal cancer and a right-sided flank hematoma, initially thought to be related to trauma over a year ago.   He has a history of a gas and perinephric collection that was not amenable to drainage. This improved over six months but appeared acutely worse in November.  He has undergone radiation therapy for his esophageal cancer and is considering chemotherapy as the next step in his treatment plan. His cancer is stage 4 and appears to be progressing.  He follows up today with another scan of his kidney that shows a chronic hematoma again with gas. There is a suggestion on his last two scans that there's possibly an underlying central lower pole renal mass about four centimeters that was not previously appreciated. Radiologic interpretation is pending, I personally reviewed that study.   He has a G-tube and consumes milkshakes four times a day for nutrition.    He is asymptomatic and voiding spontaneously with no urinary complaints.   He is focused on maintaining his quality of life and is not interested in further scans or interventions for the kidney issue unless symptoms arise.  PMH: Past Medical History:  Diagnosis Date   Arrhythmia    atrial fibrillation   Asthma    CHF (congestive heart failure) (HCC)    COPD (chronic obstructive pulmonary disease) (HCC)    Coronary artery disease    Depression    Diabetes mellitus without complication (HCC)    Gout    History  anabolic steroid use    Hyperlipidemia    Hypertension    Hypogonadism in male    MI (myocardial infarction) (HCC)    Morbid obesity (HCC)    Myocardial infarction (HCC)    Peripheral vascular disease (HCC)    Perirectal abscess    Pleurisy    Sleep apnea    CPAP at night, no oxygen   Varicella     Surgical History: Past Surgical History:  Procedure Laterality Date   ABDOMINAL AORTIC ANEURYSM REPAIR     ACHILLES TENDON SURGERY Left 01/10/2021   Procedure: ACHILLES LENGTHENING/KIDNER;  Surgeon: Rosetta Posner, DPM;  Location: [facility] OR[facility];  Service: Podiatry;  Laterality: Left;   AMPUTATION Left 10/03/2021   Procedure: AMPUTATION BELOW KNEE;  Surgeon: Renford Dills, MD;  Location: [facility];  Service: Vascular;  Laterality: Left;   AMPUTATION Right 05/24/2022   Procedure: AMPUTATION BELOW KNEE;  Surgeon: Annice Needy, MD;  Location: [facility];  Service: General;  Laterality: Right;   AMPUTATION TOE Right 02/10/2016   Procedure: AMPUTATION TOE 3RD TOE;  Surgeon: Gwyneth Revels, DPM;  Location: [facility];  Service: Podiatry;  Laterality: Right;   AMPUTATION TOE Left 02/24/2020   Procedure: AMPUTATION TOE;  Surgeon: Rosetta Posner, DPM;  Location: [facility];  Service: Podiatry;  Laterality: Left;   APPLICATION OF WOUND VAC Left 02/29/2020   Procedure: APPLICATION OF WOUND VAC;  Surgeon: Rosetta Posner, DPM;  Location: [facility];  Service: Podiatry;  Laterality: Left;  BIOPSY  03/15/2023   Procedure: BIOPSY;  Surgeon: Luke Salaam, MD;  Location: [facility] ENDOSCOPY;  Service: Gastroenterology;;   BIOPSY  04/10/2023   Procedure: BIOPSY;  Surgeon: Corky Diener, Alphonsus Jeans, MD;  Location: [facility] ENDOSCOPY;  Service: Gastroenterology;;   CARDIAC CATHETERIZATION     CARDIOVERSION [name] 02/13/2022   Procedure: CARDIOVERSION;  Surgeon: Michelle Aid, MD;  Location: [facility];  Service: Cardiovascular;  Laterality: [name];   CARDIOVERSION [name] 04/23/2023   Procedure: CARDIOVERSION;  Surgeon: Devorah Fonder, MD;   Location: [facility];  Service: Cardiovascular;  Laterality: [name];   COLONOSCOPY WITH PROPOFOL [name] 11/18/2015   Procedure: COLONOSCOPY WITH PROPOFOL;  Surgeon: Cassie Click, MD;  Location: [facility] ENDOSCOPY;  Service: Endoscopy;  Laterality: [name];   CORONARY ARTERY BYPASS GRAFT     CORONARY STENT INTERVENTION [name] 02/02/2020   Procedure: CORONARY STENT INTERVENTION;  Surgeon: Percival Brace, MD;  Location: [facility] INVASIVE [facility] LAB;  Service: Cardiovascular;  Laterality: [name];   ESOPHAGOGASTRODUODENOSCOPY (EGD) WITH PROPOFOL [name] 03/15/2023   Procedure: ESOPHAGOGASTRODUODENOSCOPY (EGD) WITH PROPOFOL;  Surgeon: Luke Salaam, MD;  Location: [facility] ENDOSCOPY;  Service: Gastroenterology;  Laterality: [name];   FLEXIBLE SIGMOIDOSCOPY [name] 04/10/2023   Procedure: FLEXIBLE SIGMOIDOSCOPY;  Surgeon: Toledo, Alphonsus Jeans, MD;  Location: [facility] ENDOSCOPY;  Service: Gastroenterology;  Laterality: [name];   GASTROSTOMY [name] 03/18/2023   Procedure: INSERTION OF GASTROSTOMY TUBE;  Surgeon: Eldred Grego, MD;  Location: [facility];  Service: General;  Laterality: [name];   IMPACTION REMOVAL  03/15/2023   Procedure: IMPACTION REMOVAL;  Surgeon: Luke Salaam, MD;  Location: [facility] ENDOSCOPY;  Service: Gastroenterology;;   INCISION AND DRAINAGE Left 08/07/2021   Procedure: INCISION AND DRAINAGE-Partial Calcanectomy;  Surgeon: Pink Bridges, DPM;  Location: [facility];  Service: Podiatry;  Laterality: Left;   IRRIGATION AND DEBRIDEMENT FOOT Left 02/29/2020   Procedure: IRRIGATION AND DEBRIDEMENT FOOT;  Surgeon: Pink Bridges, DPM;  Location: [facility];  Service: Podiatry;  Laterality: Left;   IRRIGATION AND DEBRIDEMENT FOOT Left 02/24/2020   Procedure: IRRIGATION AND DEBRIDEMENT FOOT;  Surgeon: Pink Bridges, DPM;  Location: [facility];  Service: Podiatry;  Laterality: Left;   KNEE ARTHROSCOPY     LEFT HEART CATH AND CORS/GRAFTS ANGIOGRAPHY [name] 02/02/2020   Procedure: LEFT HEART CATH AND CORS/GRAFTS ANGIOGRAPHY;  Surgeon: Ronney Cola,  MD;  Location: [facility] CV LAB;  Service: Cardiovascular;  Laterality: [name];   LOWER EXTREMITY ANGIOGRAPHY Left 02/25/2020   Procedure: Lower Extremity Angiography;  Surgeon: Celso College, MD;  Location: [facility];  Service: Cardiovascular;  Laterality: Left;   LOWER EXTREMITY ANGIOGRAPHY Left 01/04/2021   Procedure: LOWER EXTREMITY ANGIOGRAPHY;  Surgeon: Celso College, MD;  Location: [facility];  Service: Cardiovascular;  Laterality: Left;   LOWER EXTREMITY ANGIOGRAPHY Right 05/21/2022   Procedure: Lower Extremity Angiography;  Surgeon: Celso College, MD;  Location: [facility];  Service: Cardiovascular;  Laterality: Right;   METATARSAL HEAD EXCISION Left 01/10/2021   Procedure: METATARSAL HEAD EXCISION - LEFT 5th;  Surgeon: Pink Bridges, DPM;  Location: [facility];  Service: Podiatry;  Laterality: Left;   PERIPHERAL VASCULAR CATHETERIZATION Right 01/24/2016   Procedure: Lower Extremity Angiography;  Surgeon: Jackquelyn Mass, MD;  Location: [facility];  Service: Cardiovascular;  Laterality: Right;   PERIPHERAL VASCULAR CATHETERIZATION Right 01/25/2016   Procedure: Lower Extremity Angiography;  Surgeon: Jackquelyn Mass, MD;  Location: [facility];  Service: Cardiovascular;  Laterality: Right;   PORTACATH PLACEMENT [name] 03/18/2023   Procedure:  INSERTION PORT-A-CATH;  Surgeon: Eldred Grego, MD;  Location: [facility];  Service: General;  Laterality: [name];   TOE AMPUTATION     TONSILLECTOMY      Home Medications:  Allergies as of 08/14/2023       Reactions   Penicillins Other (See Comments)   Happened at 68 years old and pt. stated he passed out  He has tolerated amoxicillin/clavulanate and ampicillin/sulbactam   Statins    Other reaction(s): Muscle Pain Causes legs to ache per pt   Metformin And Related Diarrhea        Medication List        Accurate as of August 14, 2023  3:43 PM. If you have any questions, ask your nurse or  doctor.          PAUSE taking these medications    furosemide 40 MG tablet Wait to take this until your doctor or other care provider tells you to start again. Commonly known as: LASIX Take 40 mg by mouth daily.   isosorbide mononitrate 60 MG 24 hr tablet Wait to take this until your doctor or other care provider tells you to start again. Commonly known as: IMDUR Take 60 mg by mouth in the morning and at bedtime.   losartan 50 MG tablet Wait to take this until your doctor or other care provider tells you to start again. Commonly known as: COZAAR Take 50 mg by mouth daily.       TAKE these medications    acetaminophen 325 MG tablet Commonly known as: TYLENOL Place 2 tablets (650 mg total) into feeding tube every 6 (six) hours as needed for mild pain (pain score 1-3) (or Fever >/= 101).   amiodarone 200 MG tablet Commonly known as: PACERONE Take twice a day through 1/7 and then once daily What changed:  how much to take how to take this when to take this additional instructions   apixaban 5 MG Tabs tablet Commonly known as: Eliquis Place 1 tablet (5 mg total) into feeding tube 2 (two) times daily.   ascorbic acid 500 MG tablet Commonly known as: VITAMIN C Place 1 tablet (500 mg total) into feeding tube daily.   Cholecalciferol 25 MCG (1000 UT) tablet Place 1 tablet (1,000 Units total) into feeding tube daily.   clonazePAM 0.5 MG tablet Commonly known as: KLONOPIN Place 1 tablet (0.5 mg total) into feeding tube 2 (two) times daily.   Dermacloud Oint Apply 1 Application topically daily as needed.   diltiazem 300 MG 24 hr capsule Commonly known as: CARDIZEM CD Take 300 mg by mouth daily.   diphenoxylate-atropine 2.5-0.025 MG tablet Commonly known as: LOMOTIL Place 1 tablet into feeding tube 4 (four) times daily.   ezetimibe 10 MG tablet Commonly known as: ZETIA Place 1 tablet (10 mg total) into feeding tube at bedtime.   feeding supplement (NEPRO CARB  STEADY) Liqd Place 237 mLs into feeding tube 6 (six) times daily.   ferrous sulfate 300 (60 Fe) MG/5ML syrup Place 5 mLs (300 mg total) into feeding tube daily. What changed: how much to take   fiber supplement (BANATROL TF) liquid Place 60 mLs into feeding tube 2 (two) times daily.   finasteride 5 MG tablet Commonly known as: PROSCAR Take 1 tablet by mouth daily.   free water Soln Place 80 mLs into feeding tube 6 (six) times daily.   gabapentin 300 MG capsule Commonly known as: NEURONTIN Take 1 capsule by mouth at bedtime.   HumaLOG 100  UNIT/ML injection Generic drug: insulin lispro Inject 0-14 Units into the skin 3 (three) times daily with meals. Sliding scale   Lantus 100 UNIT/ML injection Generic drug: insulin glargine Inject 40 Units into the skin 2 (two) times daily.   levothyroxine 25 MCG tablet Commonly known as: SYNTHROID Place 1 tablet (25 mcg total) into feeding tube daily at 6 (six) AM.   lidocaine-prilocaine cream Commonly known as: EMLA Apply 1 Application topically as needed.   loperamide HCl 1 MG/7.5ML suspension Commonly known as: IMODIUM Place 15 mLs (2 mg total) into feeding tube every 6 (six) hours as needed for diarrhea or loose stools.   multivitamin with minerals tablet Take 1 tablet by mouth daily.   nitrofurantoin (macrocrystal-monohydrate) 100 MG capsule Commonly known as: MACROBID Take 100 mg by mouth daily.   nitroGLYCERIN 0.4 MG SL tablet Commonly known as: NITROSTAT Place 1 tablet (0.4 mg total) under the tongue every 5 (five) minutes as needed for chest pain.   nystatin powder Commonly known as: MYCOSTATIN/NYSTOP Apply 1 Application topically 2 (two) times daily. Under belly   ondansetron 4 MG tablet Commonly known as: ZOFRAN Place 1 tablet (4 mg total) into feeding tube every 8 (eight) hours as needed for nausea.   polyethylene glycol 17 g packet Commonly known as: MIRALAX / GLYCOLAX Place 17 g into feeding tube daily as  needed for moderate constipation.   pramipexole 1 MG tablet Commonly known as: MIRAPEX Place 2 tablets (2 mg total) into feeding tube at bedtime.   primidone 250 MG tablet Commonly known as: MYSOLINE Place 1 tablet (250 mg total) into feeding tube 2 (two) times daily.   tamsulosin 0.4 MG Caps capsule Commonly known as: FLOMAX Take 0.4 mg by mouth.   vitamin A & D ointment Apply 1 Application topically as needed for dry skin. Apply to stumps   Vitamin B-12 5000 MCG Tbdp Place 10,000 mcg into feeding tube daily.   zinc sulfate (50mg  elemental zinc) 220 (50 Zn) MG capsule Take 220 mg by mouth daily.        Allergies:  Allergies  Allergen Reactions   Penicillins Other (See Comments)    Happened at 68 years old and pt. stated he passed out  He has tolerated amoxicillin/clavulanate and ampicillin/sulbactam   Statins     Other reaction(s): Muscle Pain Causes legs to ache per pt   Metformin And Related Diarrhea    Family History: Family History  Problem Relation Age of Onset   Hypertension Father    Coronary artery disease Father    Alcohol abuse Father    Heart failure Brother     Social History:  reports that he quit smoking about 8 years ago. His smoking use included cigarettes. He started smoking about 53 years ago. He has a 22.5 pack-year smoking history. He has never used smokeless tobacco. He reports that he does not currently use alcohol after a past usage of about 3.0 standard drinks of alcohol per week. He reports that he does not use drugs.   Physical Exam: BP 125/73   Pulse 80   Constitutional:  Alert and oriented, No acute distress. HEENT: [place] AT, moist mucus membranes.  Trachea midline, no masses. Neurologic: Grossly intact, no focal deficits, moving all 4 extremities. Psychiatric: Normal mood and affect.   Pertinent Imaging:  08/08/2023: CT abdomen pelvis with contrast was personally reviewed. Radiologic interpretation is pending. Compared to multipe  previous serial studies.  Kidney stable from 2/25.  Last two studies are  suggestive of possible underlying mass.     Assessment & Plan:    1. Terminal Esophageal Cancer - He has stage IV esophageal cancer and has completed radiation therapy.  - He is considering chemotherapy as the next step. The focus is on maintaining quality of life.  - No new interventions are planned at this time.  - He is encouraged to live life according to his preferences and to return if any new symptoms arise.  2. Renal Mass with Chronic Hematoma - He has a chronic hematoma in the kidney with a possible underlying renal mass.  - Given his terminal cancer diagnosis and comorbidities, conservative management is recommended. - - No further imaging or intervention is planned unless he develops symptoms such as flank pain. - He is being followed with serial imaging imaging by oncology and they can let us  know if it appears worse or requires any intervention with goal of decreasing doctors appts  4. History of Urinary Retention - He has a history of urinary retention but is currently voiding spontaneously without complaints.  - He is advised to return if he experiences any issues with urination.   Return if symptoms worsen or fail to improve.  I have reviewed the above documentation for accuracy and completeness, and I agree with the above.   Dustin Gimenez, MD   [facility] [address], [address] [place], [place] [address] [phone]

## 2023-08-20 ENCOUNTER — Emergency Department

## 2023-08-20 ENCOUNTER — Inpatient Hospital Stay
Admission: EM | Admit: 2023-08-20 | Discharge: 2023-08-23 | DRG: 871 | Disposition: A | Discharge status: Hospice | Attending: Internal Medicine | Admitting: Internal Medicine

## 2023-08-20 ENCOUNTER — Encounter: Payer: Self-pay | Admitting: Emergency Medicine

## 2023-08-20 DIAGNOSIS — Z931 Gastrostomy status: Secondary | ICD-10-CM

## 2023-08-20 DIAGNOSIS — Z7901 Long term (current) use of anticoagulants: Secondary | ICD-10-CM

## 2023-08-20 DIAGNOSIS — L89312 Pressure ulcer of right buttock, stage 2: Secondary | ICD-10-CM | POA: Diagnosis present

## 2023-08-20 DIAGNOSIS — R6510 Systemic inflammatory response syndrome (SIRS) of non-infectious origin without acute organ dysfunction: Secondary | ICD-10-CM | POA: Diagnosis present

## 2023-08-20 DIAGNOSIS — Z888 Allergy status to other drugs, medicaments and biological substances status: Secondary | ICD-10-CM

## 2023-08-20 DIAGNOSIS — A419 Sepsis, unspecified organism: Secondary | ICD-10-CM | POA: Diagnosis not present

## 2023-08-20 DIAGNOSIS — J440 Chronic obstructive pulmonary disease with acute lower respiratory infection: Secondary | ICD-10-CM | POA: Diagnosis present

## 2023-08-20 DIAGNOSIS — D649 Anemia, unspecified: Secondary | ICD-10-CM

## 2023-08-20 DIAGNOSIS — I739 Peripheral vascular disease, unspecified: Secondary | ICD-10-CM | POA: Diagnosis present

## 2023-08-20 DIAGNOSIS — R6520 Severe sepsis without septic shock: Secondary | ICD-10-CM | POA: Diagnosis present

## 2023-08-20 DIAGNOSIS — I110 Hypertensive heart disease with heart failure: Secondary | ICD-10-CM | POA: Diagnosis present

## 2023-08-20 DIAGNOSIS — Z66 Do not resuscitate: Secondary | ICD-10-CM | POA: Diagnosis not present

## 2023-08-20 DIAGNOSIS — E039 Hypothyroidism, unspecified: Secondary | ICD-10-CM | POA: Diagnosis present

## 2023-08-20 DIAGNOSIS — D62 Acute posthemorrhagic anemia: Secondary | ICD-10-CM | POA: Diagnosis present

## 2023-08-20 DIAGNOSIS — K922 Gastrointestinal hemorrhage, unspecified: Secondary | ICD-10-CM | POA: Diagnosis present

## 2023-08-20 DIAGNOSIS — Z6830 Body mass index (BMI) 30.0-30.9, adult: Secondary | ICD-10-CM

## 2023-08-20 DIAGNOSIS — F419 Anxiety disorder, unspecified: Secondary | ICD-10-CM | POA: Diagnosis present

## 2023-08-20 DIAGNOSIS — R0602 Shortness of breath: Secondary | ICD-10-CM | POA: Diagnosis not present

## 2023-08-20 DIAGNOSIS — Z880 Allergy status to penicillin: Secondary | ICD-10-CM

## 2023-08-20 DIAGNOSIS — Z9221 Personal history of antineoplastic chemotherapy: Secondary | ICD-10-CM

## 2023-08-20 DIAGNOSIS — I252 Old myocardial infarction: Secondary | ICD-10-CM

## 2023-08-20 DIAGNOSIS — Z923 Personal history of irradiation: Secondary | ICD-10-CM

## 2023-08-20 DIAGNOSIS — N2889 Other specified disorders of kidney and ureter: Secondary | ICD-10-CM | POA: Diagnosis present

## 2023-08-20 DIAGNOSIS — I10 Essential (primary) hypertension: Secondary | ICD-10-CM | POA: Diagnosis present

## 2023-08-20 DIAGNOSIS — F17290 Nicotine dependence, other tobacco product, uncomplicated: Secondary | ICD-10-CM | POA: Diagnosis present

## 2023-08-20 DIAGNOSIS — N400 Enlarged prostate without lower urinary tract symptoms: Secondary | ICD-10-CM | POA: Diagnosis present

## 2023-08-20 DIAGNOSIS — Z89612 Acquired absence of left leg above knee: Secondary | ICD-10-CM

## 2023-08-20 DIAGNOSIS — I2510 Atherosclerotic heart disease of native coronary artery without angina pectoris: Secondary | ICD-10-CM | POA: Diagnosis present

## 2023-08-20 DIAGNOSIS — Z79899 Other long term (current) drug therapy: Secondary | ICD-10-CM

## 2023-08-20 DIAGNOSIS — J9601 Acute respiratory failure with hypoxia: Secondary | ICD-10-CM | POA: Diagnosis present

## 2023-08-20 DIAGNOSIS — I5032 Chronic diastolic (congestive) heart failure: Secondary | ICD-10-CM | POA: Diagnosis present

## 2023-08-20 DIAGNOSIS — E66811 Obesity, class 1: Secondary | ICD-10-CM | POA: Diagnosis present

## 2023-08-20 DIAGNOSIS — Z515 Encounter for palliative care: Secondary | ICD-10-CM

## 2023-08-20 DIAGNOSIS — K921 Melena: Secondary | ICD-10-CM | POA: Diagnosis present

## 2023-08-20 DIAGNOSIS — Z1152 Encounter for screening for COVID-19: Secondary | ICD-10-CM

## 2023-08-20 DIAGNOSIS — R509 Fever, unspecified: Secondary | ICD-10-CM

## 2023-08-20 DIAGNOSIS — S37019A Minor contusion of unspecified kidney, initial encounter: Secondary | ICD-10-CM | POA: Diagnosis present

## 2023-08-20 DIAGNOSIS — E1165 Type 2 diabetes mellitus with hyperglycemia: Secondary | ICD-10-CM | POA: Diagnosis present

## 2023-08-20 DIAGNOSIS — K222 Esophageal obstruction: Secondary | ICD-10-CM | POA: Diagnosis present

## 2023-08-20 DIAGNOSIS — Z89611 Acquired absence of right leg above knee: Secondary | ICD-10-CM

## 2023-08-20 DIAGNOSIS — Z79890 Hormone replacement therapy (postmenopausal): Secondary | ICD-10-CM

## 2023-08-20 DIAGNOSIS — Z794 Long term (current) use of insulin: Secondary | ICD-10-CM

## 2023-08-20 DIAGNOSIS — I480 Paroxysmal atrial fibrillation: Secondary | ICD-10-CM | POA: Diagnosis present

## 2023-08-20 DIAGNOSIS — Z951 Presence of aortocoronary bypass graft: Secondary | ICD-10-CM

## 2023-08-20 DIAGNOSIS — Z811 Family history of alcohol abuse and dependence: Secondary | ICD-10-CM

## 2023-08-20 DIAGNOSIS — G4733 Obstructive sleep apnea (adult) (pediatric): Secondary | ICD-10-CM | POA: Diagnosis present

## 2023-08-20 DIAGNOSIS — Z539 Procedure and treatment not carried out, unspecified reason: Secondary | ICD-10-CM | POA: Diagnosis present

## 2023-08-20 DIAGNOSIS — Z955 Presence of coronary angioplasty implant and graft: Secondary | ICD-10-CM

## 2023-08-20 DIAGNOSIS — E785 Hyperlipidemia, unspecified: Secondary | ICD-10-CM | POA: Diagnosis present

## 2023-08-20 DIAGNOSIS — J189 Pneumonia, unspecified organism: Secondary | ICD-10-CM | POA: Diagnosis present

## 2023-08-20 DIAGNOSIS — Y95 Nosocomial condition: Secondary | ICD-10-CM | POA: Diagnosis present

## 2023-08-20 DIAGNOSIS — E1151 Type 2 diabetes mellitus with diabetic peripheral angiopathy without gangrene: Secondary | ICD-10-CM | POA: Diagnosis present

## 2023-08-20 DIAGNOSIS — E66813 Obesity, class 3: Secondary | ICD-10-CM | POA: Diagnosis present

## 2023-08-20 DIAGNOSIS — Z8249 Family history of ischemic heart disease and other diseases of the circulatory system: Secondary | ICD-10-CM

## 2023-08-20 DIAGNOSIS — C155 Malignant neoplasm of lower third of esophagus: Secondary | ICD-10-CM | POA: Diagnosis present

## 2023-08-20 LAB — RESP PANEL BY RT-PCR (RSV, FLU A&B, COVID)  RVPGX2
Influenza A by PCR: NEGATIVE
Influenza B by PCR: NEGATIVE
Resp Syncytial Virus by PCR: NEGATIVE
SARS Coronavirus 2 by RT PCR: NEGATIVE

## 2023-08-20 LAB — CBC WITH DIFFERENTIAL/PLATELET
Abs Immature Granulocytes: 0.06 10*3/uL (ref 0.00–0.07)
Basophils Absolute: 0.1 10*3/uL (ref 0.0–0.1)
Basophils Relative: 1 %
Eosinophils Absolute: 0.4 10*3/uL (ref 0.0–0.5)
Eosinophils Relative: 3 %
HCT: 23.2 % — ABNORMAL LOW (ref 39.0–52.0)
Hemoglobin: 7 g/dL — ABNORMAL LOW (ref 13.0–17.0)
Immature Granulocytes: 1 %
Lymphocytes Relative: 4 %
Lymphs Abs: 0.5 10*3/uL — ABNORMAL LOW (ref 0.7–4.0)
MCH: 28.1 pg (ref 26.0–34.0)
MCHC: 30.2 g/dL (ref 30.0–36.0)
MCV: 93.2 fL (ref 80.0–100.0)
Monocytes Absolute: 0.8 10*3/uL (ref 0.1–1.0)
Monocytes Relative: 6 %
Neutro Abs: 11 10*3/uL — ABNORMAL HIGH (ref 1.7–7.7)
Neutrophils Relative %: 85 %
Platelets: 279 10*3/uL (ref 150–400)
RBC: 2.49 MIL/uL — ABNORMAL LOW (ref 4.22–5.81)
RDW: 17.2 % — ABNORMAL HIGH (ref 11.5–15.5)
WBC: 12.8 10*3/uL — ABNORMAL HIGH (ref 4.0–10.5)
nRBC: 0 % (ref 0.0–0.2)

## 2023-08-20 LAB — COMPREHENSIVE METABOLIC PANEL WITH GFR
ALT: 37 U/L (ref 0–44)
AST: 26 U/L (ref 15–41)
Albumin: 2.4 g/dL — ABNORMAL LOW (ref 3.5–5.0)
Alkaline Phosphatase: 113 U/L (ref 38–126)
Anion gap: 9 (ref 5–15)
BUN: 59 mg/dL — ABNORMAL HIGH (ref 8–23)
CO2: 25 mmol/L (ref 22–32)
Calcium: 8.2 mg/dL — ABNORMAL LOW (ref 8.9–10.3)
Chloride: 104 mmol/L (ref 98–111)
Creatinine, Ser: 0.95 mg/dL (ref 0.61–1.24)
GFR, Estimated: >60 mL/min (ref >60)
Glucose, Bld: 235 mg/dL — ABNORMAL HIGH (ref 70–99)
Potassium: 3.8 mmol/L (ref 3.5–5.1)
Sodium: 138 mmol/L (ref 135–145)
Total Bilirubin: 0.4 mg/dL (ref 0.0–1.2)
Total Protein: 6.1 g/dL — ABNORMAL LOW (ref 6.5–8.1)

## 2023-08-20 LAB — LACTIC ACID, PLASMA: Lactic Acid, Venous: 1.9 mmol/L (ref 0.5–1.9)

## 2023-08-20 LAB — BRAIN NATRIURETIC PEPTIDE: B Natriuretic Peptide: 116 pg/mL — ABNORMAL HIGH (ref 0.0–100.0)

## 2023-08-20 LAB — TROPONIN I (HIGH SENSITIVITY): Troponin I (High Sensitivity): 15 ng/L (ref <18)

## 2023-08-20 MED ORDER — SODIUM CHLORIDE 0.9 % IV SOLN
2.0000 g | Freq: Once | INTRAVENOUS | Status: AC
  Administered 2023-08-20: 2 g via INTRAVENOUS
  Filled 2023-08-20: qty 12.5

## 2023-08-20 MED ORDER — LACTATED RINGERS IV BOLUS (SEPSIS)
500.0000 mL | Freq: Once | INTRAVENOUS | Status: AC
  Administered 2023-08-20: 500 mL via INTRAVENOUS

## 2023-08-20 MED ORDER — VANCOMYCIN HCL IN DEXTROSE 1-5 GM/200ML-% IV SOLN
1000.0000 mg | Freq: Once | INTRAVENOUS | Status: AC
  Administered 2023-08-21: 1000 mg via INTRAVENOUS
  Filled 2023-08-20: qty 200

## 2023-08-20 NOTE — ED Provider Notes (Signed)
 [facility] Provider Note    Event Date/Time   First MD Initiated Contact with Patient 08/20/23 2216     (approximate)   History   Shortness of Breath   HPI  [name]. is a 68 y.o. male with history CAD, atrial fibrillation, CHF, esophageal cancer, PEG tube, diabetes, obesity, bilateral AKA's, and OSA who presents with shortness of breath, acute onset several hours ago and associated with a cough.  He he also reports some central, pressure-like chest pain.  He denies any vomiting or diarrhea.  He has no fever.  EMS found him to be tachycardic.  They gave bronchodilators and Solu-Medrol .  I reviewed the past medical records; the patient was admitted to the hospitalist service in March with respiratory distress requiring intubation and an ICU stay thought to be due to rhinovirus infection and pneumonia.  He was subsequently seen in the ED on 3/17 for shortness of breath but this resolved and workup was negative.   Physical Exam   Triage Vital Signs: ED Triage Vitals  Encounter Vitals Group     BP 08/20/23 2216 123/67     Systolic BP Percentile --      Diastolic BP Percentile --      Pulse Rate 08/20/23 2216 (!) 126     Resp 08/20/23 2216 16     Temp --      Temp src --      SpO2 08/20/23 2216 94 %     Weight --      Height 08/20/23 2218 6\' 4"  (1.93 m)     Head Circumference --      Peak Flow --      Pain Score 08/20/23 2217 0     Pain Loc --      Pain Education --      Exclude from Growth Chart --     Most recent vital signs: Vitals:   08/20/23 2230 08/20/23 2245  BP: 110/65   Pulse: (!) 128   Resp: (!) 26   Temp:  98.5 F (36.9 C)  SpO2: 94%      General: Alert, weak appearing but in no distress. CV:  Good peripheral perfusion.  Tachycardic, regular rhythm. Resp:  Slightly increased effort.  Diminished breath sounds bilaterally. Abd:  No distention.  Other:  No peripheral edema.   ED Results / Procedures / Treatments    Labs (all labs ordered are listed, but only abnormal results are displayed) Labs Reviewed  CBC WITH DIFFERENTIAL/PLATELET - Abnormal; Notable for the following components:      Result Value   WBC 12.8 (*)    RBC 2.49 (*)    Hemoglobin 7.0 (*)    HCT 23.2 (*)    RDW 17.2 (*)    Neutro Abs 11.0 (*)    Lymphs Abs 0.5 (*)    All other components within normal limits  CULTURE, BLOOD (ROUTINE X 2)  CULTURE, BLOOD (ROUTINE X 2)  RESP PANEL BY RT-PCR (RSV, FLU A&B, COVID)  RVPGX2  LACTIC ACID, PLASMA  LACTIC ACID, PLASMA  COMPREHENSIVE METABOLIC PANEL WITH GFR  BRAIN NATRIURETIC PEPTIDE  TROPONIN I (HIGH SENSITIVITY)     EKG  ED ECG REPORT I, Lind Repine, the attending physician, personally viewed and interpreted this ECG.  Date: 08/20/2023 EKG Time: 2215 Rate: 126 Rhythm: Atrial tachycardia QRS Axis: normal Intervals: RBBB, LAFB ST/T Wave abnormalities: Prolonged QTc Narrative Interpretation: no evidence of acute ischemia    RADIOLOGY  Chest x-ray: I independently viewed and interpreted the images; there are bilateral opacities concerning for pneumonia  PROCEDURES:  Critical Care performed: Yes, see critical care procedure note(s)  .Critical Care  Performed by: Lind Repine, MD Authorized by: Lind Repine, MD   Critical care provider statement:    Critical care time (minutes):  30   Critical care time was exclusive of:  Separately billable procedures and treating other patients   Critical care was necessary to treat or prevent imminent or life-threatening deterioration of the following conditions:  Respiratory failure and sepsis   Critical care was time spent personally by me on the following activities:  Development of treatment plan with patient or surrogate, discussions with consultants, evaluation of patient's response to treatment, examination of patient, ordering and review of laboratory studies, ordering and review of radiographic  studies, ordering and performing treatments and interventions, pulse oximetry, re-evaluation of patient's condition, review of old charts and obtaining history from patient or surrogate    MEDICATIONS ORDERED IN ED: Medications  lactated ringers  bolus 500 mL (500 mLs Intravenous New Bag/Given 08/20/23 2256)  vancomycin  (VANCOCIN ) IVPB 1000 mg/200 mL premix (has no administration in time range)  ceFEPIme  (MAXIPIME ) 2 g in sodium chloride  0.9 % 100 mL IVPB (has no administration in time range)     IMPRESSION / MDM / ASSESSMENT AND PLAN / ED COURSE  I reviewed the triage vital signs and the nursing notes.  68 year old male with PMH as noted above presents with acute onset of worsening shortness of breath over the last several hours.  EMS found him to be hypoxic to the 80s and tachycardic to the 120s.  He is now on 4 L O2 with an O2 saturation in the mid 90s.  He is tachycardic to the 120s with otherwise normal vital signs.  Exam demonstrates slightly increased respiratory effort but no distress, and diminished breath sounds bilaterally.  Differential diagnosis includes, but is not limited to, sepsis, possible pneumonia, acute bronchitis, viral syndrome such as COVID or influenza, UTI or other nonrespiratory source, dehydration, lecture light abnormality, other metabolic cause.  I have a low suspicion for CHF exacerbation or other cardiac etiology; the patient appears dehydrated clinically.  EKG shows tachycardia with RBBB and LAFB causing resulting wide-complex QRS but the overall morphology is similar to prior.  This does not appear consistent with V. tach.  We will obtain lab workup, chest x-ray, give fluids, and reassess.  Patient's presentation is most consistent with acute presentation with potential threat to life or bodily function.  The patient is on the cardiac monitor to evaluate for evidence of arrhythmia and/or significant heart rate changes.    ----------------------------------------- 11:05 PM on 08/20/2023 -----------------------------------------  Chest x-ray shows findings concerning for pneumonia.  Lab workup is pending.  I have ordered IV antibiotics for empiric coverage of HCAP.  I signed the patient out to the oncoming ED physician Dr. Vallery Gavel.   FINAL CLINICAL IMPRESSION(S) / ED DIAGNOSES   Final diagnoses:  Shortness of breath     Rx / DC Orders   ED Discharge Orders     None        Note:  This document was prepared using Dragon voice recognition software and may include unintentional dictation errors.    Lind Repine, MD 08/20/23 [phone]

## 2023-08-20 NOTE — ED Triage Notes (Signed)
 Pt arrived via [facility] from home with c/o weakness and respiratory distress. EMS reported initial oxygen  saturation was 88% given 1 Duo-neb and placed on 4L oxygen  by nasal cannula with increase to 92%. Pt received 125mg  Solumedrol in route to ED. Hx/o CPOD and CHF.

## 2023-08-20 NOTE — ED Provider Notes (Signed)
-----------------------------------------   11:16 PM on 08/20/2023 -----------------------------------------   Assumed care of patient who is pending lab work and anticipate hospitalization.  ----------------------------------------- 12:13 AM on 08/21/2023 -----------------------------------------   Laboratory results significant for anemia, lactic acid less than 2.  Will consult hospitalist services for evaluation and admission.  Tylenol  given for fever.   Tamiko Leopard J, MD 08/21/23 [phone]

## 2023-08-20 NOTE — ED Notes (Signed)
 CCMD called to initiate cardiac monitoring

## 2023-08-21 ENCOUNTER — Inpatient Hospital Stay: Admitting: Anesthesiology

## 2023-08-21 ENCOUNTER — Encounter
Admission: EM | Disposition: A | Discharge status: Hospice | Payer: Self-pay | Source: Home / Self Care | Attending: Internal Medicine

## 2023-08-21 ENCOUNTER — Encounter: Payer: Self-pay | Admitting: Internal Medicine

## 2023-08-21 ENCOUNTER — Other Ambulatory Visit: Payer: Self-pay

## 2023-08-21 DIAGNOSIS — D649 Anemia, unspecified: Secondary | ICD-10-CM

## 2023-08-21 DIAGNOSIS — C155 Malignant neoplasm of lower third of esophagus: Secondary | ICD-10-CM | POA: Diagnosis present

## 2023-08-21 DIAGNOSIS — J440 Chronic obstructive pulmonary disease with acute lower respiratory infection: Secondary | ICD-10-CM | POA: Diagnosis present

## 2023-08-21 DIAGNOSIS — D62 Acute posthemorrhagic anemia: Secondary | ICD-10-CM | POA: Diagnosis present

## 2023-08-21 DIAGNOSIS — R6520 Severe sepsis without septic shock: Secondary | ICD-10-CM | POA: Diagnosis present

## 2023-08-21 DIAGNOSIS — J189 Pneumonia, unspecified organism: Secondary | ICD-10-CM

## 2023-08-21 DIAGNOSIS — R0602 Shortness of breath: Secondary | ICD-10-CM | POA: Diagnosis present

## 2023-08-21 DIAGNOSIS — E66811 Obesity, class 1: Secondary | ICD-10-CM | POA: Diagnosis present

## 2023-08-21 DIAGNOSIS — E1165 Type 2 diabetes mellitus with hyperglycemia: Secondary | ICD-10-CM | POA: Diagnosis present

## 2023-08-21 DIAGNOSIS — J9601 Acute respiratory failure with hypoxia: Secondary | ICD-10-CM | POA: Diagnosis present

## 2023-08-21 DIAGNOSIS — Z89612 Acquired absence of left leg above knee: Secondary | ICD-10-CM | POA: Diagnosis not present

## 2023-08-21 DIAGNOSIS — K921 Melena: Secondary | ICD-10-CM | POA: Diagnosis present

## 2023-08-21 DIAGNOSIS — Z931 Gastrostomy status: Secondary | ICD-10-CM

## 2023-08-21 DIAGNOSIS — I110 Hypertensive heart disease with heart failure: Secondary | ICD-10-CM | POA: Diagnosis present

## 2023-08-21 DIAGNOSIS — R6510 Systemic inflammatory response syndrome (SIRS) of non-infectious origin without acute organ dysfunction: Secondary | ICD-10-CM | POA: Diagnosis present

## 2023-08-21 DIAGNOSIS — Y95 Nosocomial condition: Secondary | ICD-10-CM | POA: Diagnosis present

## 2023-08-21 DIAGNOSIS — Z515 Encounter for palliative care: Secondary | ICD-10-CM | POA: Diagnosis not present

## 2023-08-21 DIAGNOSIS — I5032 Chronic diastolic (congestive) heart failure: Secondary | ICD-10-CM | POA: Diagnosis present

## 2023-08-21 DIAGNOSIS — Z1152 Encounter for screening for COVID-19: Secondary | ICD-10-CM | POA: Diagnosis not present

## 2023-08-21 DIAGNOSIS — E039 Hypothyroidism, unspecified: Secondary | ICD-10-CM | POA: Diagnosis present

## 2023-08-21 DIAGNOSIS — Z794 Long term (current) use of insulin: Secondary | ICD-10-CM | POA: Diagnosis not present

## 2023-08-21 DIAGNOSIS — L89312 Pressure ulcer of right buttock, stage 2: Secondary | ICD-10-CM | POA: Diagnosis present

## 2023-08-21 DIAGNOSIS — A419 Sepsis, unspecified organism: Secondary | ICD-10-CM | POA: Insufficient documentation

## 2023-08-21 DIAGNOSIS — K922 Gastrointestinal hemorrhage, unspecified: Secondary | ICD-10-CM | POA: Diagnosis not present

## 2023-08-21 DIAGNOSIS — I480 Paroxysmal atrial fibrillation: Secondary | ICD-10-CM | POA: Diagnosis present

## 2023-08-21 DIAGNOSIS — K222 Esophageal obstruction: Secondary | ICD-10-CM | POA: Diagnosis present

## 2023-08-21 DIAGNOSIS — Z66 Do not resuscitate: Secondary | ICD-10-CM | POA: Diagnosis not present

## 2023-08-21 DIAGNOSIS — Z89611 Acquired absence of right leg above knee: Secondary | ICD-10-CM | POA: Diagnosis not present

## 2023-08-21 DIAGNOSIS — E1151 Type 2 diabetes mellitus with diabetic peripheral angiopathy without gangrene: Secondary | ICD-10-CM | POA: Diagnosis present

## 2023-08-21 HISTORY — PX: ESOPHAGOGASTRODUODENOSCOPY: SHX5428

## 2023-08-21 LAB — BASIC METABOLIC PANEL WITH GFR
Anion gap: 7 (ref 5–15)
BUN: 70 mg/dL — ABNORMAL HIGH (ref 8–23)
CO2: 27 mmol/L (ref 22–32)
Calcium: 8.7 mg/dL — ABNORMAL LOW (ref 8.9–10.3)
Chloride: 101 mmol/L (ref 98–111)
Creatinine, Ser: 1.13 mg/dL (ref 0.61–1.24)
GFR, Estimated: >60 mL/min (ref >60)
Glucose, Bld: 269 mg/dL — ABNORMAL HIGH (ref 70–99)
Potassium: 4.3 mmol/L (ref 3.5–5.1)
Sodium: 135 mmol/L (ref 135–145)

## 2023-08-21 LAB — CBC
HCT: 19.5 % — ABNORMAL LOW (ref 39.0–52.0)
HCT: 21.4 % — ABNORMAL LOW (ref 39.0–52.0)
Hemoglobin: 6 g/dL — ABNORMAL LOW (ref 13.0–17.0)
Hemoglobin: 6.9 g/dL — ABNORMAL LOW (ref 13.0–17.0)
MCH: 28.8 pg (ref 26.0–34.0)
MCH: 29.9 pg (ref 26.0–34.0)
MCHC: 30.8 g/dL (ref 30.0–36.0)
MCHC: 32.2 g/dL (ref 30.0–36.0)
MCV: 93.8 fL (ref 80.0–100.0)
MCV: 92.6 fL (ref 80.0–100.0)
Platelets: 201 10*3/uL (ref 150–400)
Platelets: 154 K/uL (ref 150–400)
RBC: 2.08 MIL/uL — ABNORMAL LOW (ref 4.22–5.81)
RBC: 2.31 MIL/uL — ABNORMAL LOW (ref 4.22–5.81)
RDW: 17 % — ABNORMAL HIGH (ref 11.5–15.5)
RDW: 16.5 % — ABNORMAL HIGH (ref 11.5–15.5)
WBC: 12.8 10*3/uL — ABNORMAL HIGH (ref 4.0–10.5)
WBC: 10.1 K/uL (ref 4.0–10.5)
nRBC: 0 % (ref 0.0–0.2)
nRBC: 0 % (ref 0.0–0.2)

## 2023-08-21 LAB — MRSA NEXT GEN BY PCR, NASAL: MRSA by PCR Next Gen: DETECTED — AB

## 2023-08-21 LAB — URINALYSIS, ROUTINE W REFLEX MICROSCOPIC
Bacteria, UA: NONE SEEN
Bilirubin Urine: NEGATIVE
Glucose, UA: NEGATIVE mg/dL
Hgb urine dipstick: NEGATIVE
Ketones, ur: NEGATIVE mg/dL
Nitrite: NEGATIVE
Protein, ur: 30 mg/dL — AB
Specific Gravity, Urine: 1.017 (ref 1.005–1.030)
Squamous Epithelial / HPF: 0 /HPF (ref 0–5)
pH: 5 (ref 5.0–8.0)

## 2023-08-21 LAB — TROPONIN I (HIGH SENSITIVITY): Troponin I (High Sensitivity): 15 ng/L (ref <18)

## 2023-08-21 LAB — HEMOGLOBIN: Hemoglobin: 6.1 g/dL — ABNORMAL LOW (ref 13.0–17.0)

## 2023-08-21 LAB — PREPARE RBC (CROSSMATCH)

## 2023-08-21 LAB — CBG MONITORING, ED: Glucose-Capillary: 194 mg/dL — ABNORMAL HIGH (ref 70–99)

## 2023-08-21 LAB — GLUCOSE, CAPILLARY: Glucose-Capillary: 147 mg/dL — ABNORMAL HIGH (ref 70–99)

## 2023-08-21 SURGERY — EGD (ESOPHAGOGASTRODUODENOSCOPY)
Anesthesia: General

## 2023-08-21 MED ORDER — SODIUM CHLORIDE 0.9% IV SOLUTION
Freq: Once | INTRAVENOUS | Status: DC
Start: 2023-08-21 — End: 2023-08-22
  Filled 2023-08-21: qty 250

## 2023-08-21 MED ORDER — PRIMIDONE 250 MG PO TABS
250.0000 mg | ORAL_TABLET | Freq: Two times a day (BID) | ORAL | Status: DC
  Administered 2023-08-21 – 2023-08-23 (×5): 250 mg
  Filled 2023-08-21 (×8): qty 1

## 2023-08-21 MED ORDER — AMIODARONE HCL 200 MG PO TABS
200.0000 mg | ORAL_TABLET | Freq: Every day | ORAL | Status: DC
  Administered 2023-08-21 – 2023-08-22 (×2): 200 mg via ORAL
  Filled 2023-08-21 (×2): qty 1

## 2023-08-21 MED ORDER — EZETIMIBE 10 MG PO TABS
10.0000 mg | ORAL_TABLET | Freq: Every day | ORAL | Status: DC
  Administered 2023-08-21: 10 mg
  Filled 2023-08-21: qty 1

## 2023-08-21 MED ORDER — LACTATED RINGERS IV SOLN: INTRAVENOUS | Status: DC

## 2023-08-21 MED ORDER — VANCOMYCIN HCL 1500 MG/300ML IV SOLN
1500.0000 mg | Freq: Once | INTRAVENOUS | Status: AC
  Administered 2023-08-21: 1500 mg via INTRAVENOUS
  Filled 2023-08-21: qty 300

## 2023-08-21 MED ORDER — ACETAMINOPHEN 325 MG PO TABS: 650.0000 mg | ORAL_TABLET | Freq: Four times a day (QID) | ORAL | Status: DC | PRN

## 2023-08-21 MED ORDER — SODIUM CHLORIDE 0.9% IV SOLUTION
Freq: Once | INTRAVENOUS | Status: AC
  Filled 2023-08-21: qty 250

## 2023-08-21 MED ORDER — IPRATROPIUM-ALBUTEROL 0.5-2.5 (3) MG/3ML IN SOLN
3.0000 mL | Freq: Three times a day (TID) | RESPIRATORY_TRACT | Status: DC
  Administered 2023-08-22: 3 mL via RESPIRATORY_TRACT
  Filled 2023-08-21 (×2): qty 3

## 2023-08-21 MED ORDER — SODIUM CHLORIDE 0.9 % IV SOLN
2.0000 g | Freq: Three times a day (TID) | INTRAVENOUS | Status: DC
  Administered 2023-08-21 – 2023-08-22 (×3): 2 g via INTRAVENOUS
  Filled 2023-08-21 (×4): qty 12.5

## 2023-08-21 MED ORDER — PRAMIPEXOLE DIHYDROCHLORIDE 1 MG PO TABS
2.0000 mg | ORAL_TABLET | Freq: Every day | ORAL | Status: DC
  Administered 2023-08-21 – 2023-08-22 (×2): 2 mg
  Filled 2023-08-21 (×4): qty 2

## 2023-08-21 MED ORDER — LACTATED RINGERS IV SOLN
150.0000 mL/h | INTRAVENOUS | Status: DC
  Administered 2023-08-21 (×2): 150 mL/h via INTRAVENOUS

## 2023-08-21 MED ORDER — PANTOPRAZOLE SODIUM 40 MG IV SOLR
40.0000 mg | Freq: Two times a day (BID) | INTRAVENOUS | Status: DC
  Administered 2023-08-21 – 2023-08-22 (×2): 40 mg via INTRAVENOUS
  Filled 2023-08-21 (×2): qty 10

## 2023-08-21 MED ORDER — TAMSULOSIN HCL 0.4 MG PO CAPS
0.4000 mg | ORAL_CAPSULE | Freq: Every day | ORAL | Status: DC
  Administered 2023-08-21 – 2023-08-23 (×3): 0.4 mg via ORAL
  Filled 2023-08-21 (×3): qty 1

## 2023-08-21 MED ORDER — GABAPENTIN 300 MG PO CAPS
300.0000 mg | ORAL_CAPSULE | Freq: Every day | ORAL | Status: DC
  Administered 2023-08-21: 300 mg via ORAL
  Filled 2023-08-21: qty 1

## 2023-08-21 MED ORDER — ACETAMINOPHEN 500 MG PO TABS
1000.0000 mg | ORAL_TABLET | Freq: Once | ORAL | Status: DC
  Filled 2023-08-21: qty 2

## 2023-08-21 MED ORDER — ONDANSETRON HCL 4 MG/2ML IJ SOLN
4.0000 mg | Freq: Four times a day (QID) | INTRAMUSCULAR | Status: DC | PRN
  Administered 2023-08-22: 4 mg via INTRAVENOUS
  Filled 2023-08-21: qty 2

## 2023-08-21 MED ORDER — ACETAMINOPHEN 650 MG RE SUPP: 650.0000 mg | Freq: Four times a day (QID) | RECTAL | Status: DC | PRN

## 2023-08-21 MED ORDER — NITROGLYCERIN 0.4 MG SL SUBL
0.4000 mg | SUBLINGUAL_TABLET | SUBLINGUAL | Status: DC | PRN
  Administered 2023-08-21: 0.4 mg via SUBLINGUAL
  Filled 2023-08-21: qty 1

## 2023-08-21 MED ORDER — LEVOTHYROXINE SODIUM 25 MCG PO TABS
25.0000 ug | ORAL_TABLET | Freq: Every day | ORAL | Status: DC
  Administered 2023-08-21 – 2023-08-23 (×3): 25 ug
  Filled 2023-08-21 (×3): qty 1

## 2023-08-21 MED ORDER — SODIUM CHLORIDE 0.9 % IV SOLN
2.0000 g | Freq: Three times a day (TID) | INTRAVENOUS | Status: DC
  Administered 2023-08-21: 2 g via INTRAVENOUS
  Filled 2023-08-21: qty 12.5

## 2023-08-21 MED ORDER — LIDOCAINE HCL (CARDIAC) PF 100 MG/5ML IV SOSY
PREFILLED_SYRINGE | INTRAVENOUS | Status: DC | PRN
  Administered 2023-08-21: 100 mg via INTRAVENOUS

## 2023-08-21 MED ORDER — INSULIN GLARGINE-YFGN 100 UNIT/ML ~~LOC~~ SOLN
20.0000 [IU] | Freq: Two times a day (BID) | SUBCUTANEOUS | Status: DC
  Administered 2023-08-21 (×2): 20 [IU] via SUBCUTANEOUS
  Filled 2023-08-21 (×4): qty 0.2

## 2023-08-21 MED ORDER — SODIUM CHLORIDE 0.9% IV SOLUTION
Freq: Once | INTRAVENOUS | Status: DC
  Filled 2023-08-21: qty 250

## 2023-08-21 MED ORDER — ALBUTEROL SULFATE (2.5 MG/3ML) 0.083% IN NEBU: 2.5000 mg | INHALATION_SOLUTION | RESPIRATORY_TRACT | Status: DC | PRN

## 2023-08-21 MED ORDER — ORAL CARE MOUTH RINSE: 15.0000 mL | OROMUCOSAL | Status: DC | PRN

## 2023-08-21 MED ORDER — ONDANSETRON HCL 4 MG PO TABS: 4.0000 mg | ORAL_TABLET | Freq: Four times a day (QID) | ORAL | Status: DC | PRN

## 2023-08-21 MED ORDER — PROPOFOL 10 MG/ML IV BOLUS
INTRAVENOUS | Status: DC | PRN
  Administered 2023-08-21: 10 mg via INTRAVENOUS
  Administered 2023-08-21: 70 mg via INTRAVENOUS

## 2023-08-21 MED ORDER — FINASTERIDE 5 MG PO TABS
5.0000 mg | ORAL_TABLET | Freq: Every day | ORAL | Status: DC
  Administered 2023-08-21 – 2023-08-23 (×3): 5 mg via ORAL
  Filled 2023-08-21 (×3): qty 1

## 2023-08-21 MED ORDER — IPRATROPIUM-ALBUTEROL 0.5-2.5 (3) MG/3ML IN SOLN
3.0000 mL | Freq: Four times a day (QID) | RESPIRATORY_TRACT | Status: DC
  Administered 2023-08-21 (×3): 3 mL via RESPIRATORY_TRACT
  Filled 2023-08-21 (×3): qty 3

## 2023-08-21 MED ORDER — CLONAZEPAM 0.5 MG PO TABS
0.5000 mg | ORAL_TABLET | Freq: Two times a day (BID) | ORAL | Status: DC
Start: 2023-08-21 — End: 2023-08-23
  Administered 2023-08-21 – 2023-08-23 (×5): 0.5 mg
  Filled 2023-08-21 (×6): qty 1

## 2023-08-21 MED ORDER — VANCOMYCIN HCL 1750 MG/350ML IV SOLN
1750.0000 mg | Freq: Two times a day (BID) | INTRAVENOUS | Status: DC
  Filled 2023-08-21: qty 350

## 2023-08-21 MED ORDER — ACETAMINOPHEN 500 MG PO TABS
1000.0000 mg | ORAL_TABLET | Freq: Once | ORAL | Status: AC
  Administered 2023-08-21: 1000 mg

## 2023-08-21 MED ORDER — SODIUM CHLORIDE 0.9 % IV BOLUS
500.0000 mL | Freq: Once | INTRAVENOUS | Status: AC
  Administered 2023-08-21: 500 mL via INTRAVENOUS

## 2023-08-21 NOTE — Assessment & Plan Note (Signed)
 Clinically euvolemic but close monitoring for fluid overload in view of sepsis fluids Holding Lasix , isosorbide , losartan  because of low blood pressures

## 2023-08-21 NOTE — Assessment & Plan Note (Addendum)
 Peripheral artery disease s/p bilateral BKA Holding Eliquis , due to anemia  Continue ezetimibe  Supportive care for transfers and fall precautions

## 2023-08-21 NOTE — Assessment & Plan Note (Deleted)
 S/p bilateral BKA Holding blood thinners due to anemia Continue ezetimibe  Supportive care for transfers and fall precautions

## 2023-08-21 NOTE — ED Notes (Signed)
 Spouse asked for pt to be able to have ice chips and kangaroo pump feeding orders. Yates MD ordered to keep NPO and strictly meds only per tube throughout night to assess for further bleeding and to be cautious. So far, no new tarry stools have been noted since night shift.

## 2023-08-21 NOTE — Assessment & Plan Note (Addendum)
 Chronic perinephric hematoma with renal mass Chronic anticoagulation Hemoglobin 7, down from 10 months prior CT abdomen from 08/08/23 ordered by urology showed: "No change in the size of the complex bilobed fluid collection in the right posterior retroperitoneal soft tissues extending into the adjacent iliopsoas muscle and right lumbar muscle. Collection appears better defined more hypodense and could correlate with resolving hematoma" Will get urinalysis Repeat CT abdomen and pelvis if H/H downtrending Hold systemic anticoagulation for now pending stability of hemoglobin

## 2023-08-21 NOTE — Assessment & Plan Note (Addendum)
 History of acute urinary retention History of Klebsiella UTI 03/2023 Previously on UTI prophylaxis nitrofurantoin  No acute issues at the moment Continue finasteride  and tamsulosin  Last seen by urologist 08/14/2023 Follow urinalysis to evaluate for possible UTI

## 2023-08-21 NOTE — Progress Notes (Signed)
 Pharmacy Antibiotic Note  [name]. is a 68 y.o. male admitted on 08/20/2023 with pneumonia.  Pharmacy has been consulted for Vanc, Cefepime  dosing.  Plan: Cefepime  2 gm IV X 1 given in ED on 4/22 @ 2313. Cefepime  2 gm IV Q8H ordered to start on 4/23 @ 0700.   Vancomycin  1 gm IV X 1 given on 4/23 @ 0011.   Additional Vanc 1500 mg IV X 1 ordered to make total loading dose of 2500 mg.  Vancomycin  1750 mg IV Q12H ordered to start on 4/23 @ 1200.  AUC = 530.5 Vanc trough = 15.4   Height: 6\' 4"  (193 cm) IBW/kg (Calculated) : 86.8  Temp (24hrs), Avg:99.8 F (37.7 C), Min:98.5 F (36.9 C), Max:101.1 F (38.4 C)  Recent Labs  Lab 08/20/23 2247  WBC 12.8*  CREATININE 0.95  LATICACIDVEN 1.9    CrCl cannot be calculated (Unknown ideal weight.).    Allergies  Allergen Reactions   Penicillins Other (See Comments)    Happened at 68 years old and pt. stated he passed out  He has tolerated amoxicillin /clavulanate and ampicillin /sulbactam   Statins     Other reaction(s): Muscle Pain Causes legs to ache per pt   Metformin  And Related Diarrhea    Antimicrobials this admission:   >>    >>   Dose adjustments this admission:   Microbiology results:  BCx:   UCx:    Sputum:    MRSA PCR:   Thank you for allowing pharmacy to be a part of this patient's care.  Irene Mitcham D 08/21/2023 4:23 AM

## 2023-08-21 NOTE — Assessment & Plan Note (Signed)
 Blood glucose 235 Continue basal insulin  Sliding scale insulin  coverage

## 2023-08-21 NOTE — Assessment & Plan Note (Signed)
 Holding anticoagulation due to suspected acute blood loss Continue amiodarone  Hold diltiazem  due to soft blood pressures

## 2023-08-21 NOTE — H&P (Signed)
 History and Physical    Patient: [name]. WUJ:[id number] DOB: [birth year] DOA: 08/20/2023 DOS: the patient was seen and examined on 08/21/2023 PCP: Yehuda Helms, MD  Patient coming from: Home  Chief Complaint:  Chief Complaint  Patient presents with   Shortness of Breath    HPI: Alec Bubolz. is a 68 y.o. male with medical history significant for CAD, Afib, CHF, esophageal cancer (s/p chemoradiation), G tube dependent, PAD s/p bilateral AKA ,DM, renal mass with chronic hematoma being followed by urology (last seen 08/14/23), morbid obesity, OSA hospitalized from 3/1 to 3/10 with respiratory failure from RSV vs aspiration pneumonia requiring intubation, weaned off oxygen  prior to discharge, who presents with respiratory distress.  EMS reports O2 sat of 88% on room air improving to 92 on 4 L.  He received DuoNebs and Solu-Medrol  en route. Wife at bedside who gives a history, also states that for the past 4 days he has been having black stool. ED course and data review: Tachycardic to 126 with Tmax 101.4.  During workup, BP dropped as low as 98/60 from 123/67 on triage.  O2 sats mid 90s on 4 L. Labs notable for the following: WBC 12,800 with lactic acid 1.9.  Respiratory viral panel negative Troponin 15 and BNP 116 Hemoglobin 7, down from 10.1 a month ago Blood glucose 235 LFTs WNL EKG, personally viewed and interpreted showing atrial tachycardia at 126 Chest x-ray shows interstitial and patchy airspace opacities in the left lower lung representing atelectasis or pneumonia UA not done Patient started on broad-spectrum antibiotics to cover HCAP with Maxipime  and vancomycin  Sepsis fluids 500 mg ordered  Hospitalist consulted for admission.   Review of Systems: As mentioned in the history of present illness. All other systems reviewed and are negative.  Past Medical History:  Diagnosis Date   Arrhythmia    atrial fibrillation   Asthma    CHF (congestive heart  failure) (HCC)    COPD (chronic obstructive pulmonary disease) (HCC)    Coronary artery disease    Depression    Diabetes mellitus without complication (HCC)    Gout    History anabolic steroid use    Hyperlipidemia    Hypertension    Hypogonadism in male    MI (myocardial infarction) (HCC)    Morbid obesity (HCC)    Myocardial infarction (HCC)    Peripheral vascular disease (HCC)    Perirectal abscess    Pleurisy    Sleep apnea    CPAP at night, no oxygen    Varicella    Past Surgical History:  Procedure Laterality Date   ABDOMINAL AORTIC ANEURYSM REPAIR     ACHILLES TENDON SURGERY Left 01/10/2021   Procedure: ACHILLES LENGTHENING/KIDNER;  Surgeon: Pink Bridges, DPM;  Location: [facility];  Service: Podiatry;  Laterality: Left;   AMPUTATION Left 10/03/2021   Procedure: AMPUTATION BELOW KNEE;  Surgeon: Jackquelyn Mass, MD;  Location: [facility];  Service: Vascular;  Laterality: Left;   AMPUTATION Right 05/24/2022   Procedure: AMPUTATION BELOW KNEE;  Surgeon: Celso College, MD;  Location: [facility];  Service: General;  Laterality: Right;   AMPUTATION TOE Right 02/10/2016   Procedure: AMPUTATION TOE 3RD TOE;  Surgeon: Anell Baptist, DPM;  Location: [facility];  Service: Podiatry;  Laterality: Right;   AMPUTATION TOE Left 02/24/2020   Procedure: AMPUTATION TOE;  Surgeon: Pink Bridges, DPM;  Location: [facility];  Service: Podiatry;  Laterality: Left;   APPLICATION OF WOUND VAC Left 02/29/2020  Procedure: APPLICATION OF WOUND VAC;  Surgeon: Pink Bridges, DPM;  Location: [facility];  Service: Podiatry;  Laterality: Left;   BIOPSY  03/15/2023   Procedure: BIOPSY;  Surgeon: Luke Salaam, MD;  Location: [facility];  Service: Gastroenterology;;   BIOPSY  04/10/2023   Procedure: BIOPSY;  Surgeon: Corky Diener, Alphonsus Jeans, MD;  Location: [facility];  Service: Gastroenterology;;   CARDIAC CATHETERIZATION     CARDIOVERSION [name] 02/13/2022   Procedure: CARDIOVERSION;  Surgeon: Michelle Aid,  MD;  Location: [facility];  Service: Cardiovascular;  Laterality: [name];   CARDIOVERSION [name] 04/23/2023   Procedure: CARDIOVERSION;  Surgeon: Devorah Fonder, MD;  Location: [facility];  Service: Cardiovascular;  Laterality: [name];   COLONOSCOPY WITH PROPOFOL  [name] 11/18/2015   Procedure: COLONOSCOPY WITH PROPOFOL ;  Surgeon: Cassie Click, MD;  Location: [facility];  Service: Endoscopy;  Laterality: [name];   CORONARY ARTERY BYPASS GRAFT     CORONARY STENT INTERVENTION [name] 02/02/2020   Procedure: CORONARY STENT INTERVENTION;  Surgeon: Percival Brace, MD;  Location: [facility];  Service: Cardiovascular;  Laterality: [name];   ESOPHAGOGASTRODUODENOSCOPY (EGD) WITH PROPOFOL  [name] 03/15/2023   Procedure: ESOPHAGOGASTRODUODENOSCOPY (EGD) WITH PROPOFOL ;  Surgeon: Luke Salaam, MD;  Location: [facility];  Service: Gastroenterology;  Laterality: [name];   FLEXIBLE SIGMOIDOSCOPY [name] 04/10/2023   Procedure: FLEXIBLE SIGMOIDOSCOPY;  Surgeon: Toledo, Alphonsus Jeans, MD;  Location: [facility];  Service: Gastroenterology;  Laterality: [name];   GASTROSTOMY [name] 03/18/2023   Procedure: INSERTION OF GASTROSTOMY TUBE;  Surgeon: Eldred Grego, MD;  Location: [facility];  Service: General;  Laterality: [name];   IMPACTION REMOVAL  03/15/2023   Procedure: IMPACTION REMOVAL;  Surgeon: Luke Salaam, MD;  Location: [facility];  Service: Gastroenterology;;   INCISION AND DRAINAGE Left 08/07/2021   Procedure: INCISION AND DRAINAGE-Partial Calcanectomy;  Surgeon: Pink Bridges, DPM;  Location: [facility];  Service: Podiatry;  Laterality: Left;   IRRIGATION AND DEBRIDEMENT FOOT Left 02/29/2020   Procedure: IRRIGATION AND DEBRIDEMENT FOOT;  Surgeon: Pink Bridges, DPM;  Location: [facility];  Service: Podiatry;  Laterality: Left;   IRRIGATION AND DEBRIDEMENT FOOT Left 02/24/2020   Procedure: IRRIGATION AND DEBRIDEMENT FOOT;  Surgeon: Pink Bridges, DPM;  Location: [facility];  Service: Podiatry;  Laterality: Left;    KNEE ARTHROSCOPY     LEFT HEART CATH AND CORS/GRAFTS ANGIOGRAPHY [name] 02/02/2020   Procedure: LEFT HEART CATH AND CORS/GRAFTS ANGIOGRAPHY;  Surgeon: Ronney Cola, MD;  Location: [facility];  Service: Cardiovascular;  Laterality: [name];   LOWER EXTREMITY ANGIOGRAPHY Left 02/25/2020   Procedure: Lower Extremity Angiography;  Surgeon: Celso College, MD;  Location: [facility];  Service: Cardiovascular;  Laterality: Left;   LOWER EXTREMITY ANGIOGRAPHY Left 01/04/2021   Procedure: LOWER EXTREMITY ANGIOGRAPHY;  Surgeon: Celso College, MD;  Location: [facility];  Service: Cardiovascular;  Laterality: Left;   LOWER EXTREMITY ANGIOGRAPHY Right 05/21/2022   Procedure: Lower Extremity Angiography;  Surgeon: Celso College, MD;  Location: [facility];  Service: Cardiovascular;  Laterality: Right;   METATARSAL HEAD EXCISION Left 01/10/2021   Procedure: METATARSAL HEAD EXCISION - LEFT 5th;  Surgeon: Pink Bridges, DPM;  Location: [facility];  Service: Podiatry;  Laterality: Left;   PERIPHERAL VASCULAR CATHETERIZATION Right 01/24/2016   Procedure: Lower Extremity Angiography;  Surgeon: Jackquelyn Mass, MD;  Location: [facility];  Service: Cardiovascular;  Laterality: Right;   PERIPHERAL VASCULAR CATHETERIZATION Right 01/25/2016   Procedure: Lower Extremity Angiography;  Surgeon: Jackquelyn Mass,  MD;  Location: [facility];  Service: Cardiovascular;  Laterality: Right;   PORTACATH PLACEMENT [name] 03/18/2023   Procedure: INSERTION PORT-A-CATH;  Surgeon: Eldred Grego, MD;  Location: [facility];  Service: General;  Laterality: [name];   TOE AMPUTATION     TONSILLECTOMY     Social History:  reports that he quit smoking about 8 years ago. His smoking use included cigarettes. He started smoking about 53 years ago. He has a 22.5 pack-year smoking history. He has never used smokeless tobacco. He reports that he does not currently use alcohol  after a past usage of  about 3.0 standard drinks of alcohol  per week. He reports that he does not use drugs.  Allergies  Allergen Reactions   Penicillins Other (See Comments)    Happened at 68 years old and pt. stated he passed out  He has tolerated amoxicillin /clavulanate and ampicillin /sulbactam   Statins     Other reaction(s): Muscle Pain Causes legs to ache per pt   Metformin  And Related Diarrhea    Family History  Problem Relation Age of Onset   Hypertension Father    Coronary artery disease Father    Alcohol  abuse Father    Heart failure Brother     Prior to Admission medications   Medication Sig Start Date End Date Taking? Authorizing Provider  acetaminophen  (TYLENOL ) 325 MG tablet Place 2 tablets (650 mg total) into feeding tube every 6 (six) hours as needed for mild pain (pain score 1-3) (or Fever >/= 101). 03/22/23   Sheril Dines, MD  amiodarone  (PACERONE ) 200 MG tablet Take twice a day through 1/7 and then once daily Patient taking differently: Take 200 mg by mouth daily. 05/03/23   Wouk, Haynes Lips, MD  apixaban  (ELIQUIS ) 5 MG TABS tablet Place 1 tablet (5 mg total) into feeding tube 2 (two) times daily. 03/22/23   Sheril Dines, MD  ascorbic acid  (VITAMIN C ) 500 MG tablet Place 1 tablet (500 mg total) into feeding tube daily. 03/22/23   Sheril Dines, MD  Cholecalciferol  25 MCG (1000 UT) tablet Place 1 tablet (1,000 Units total) into feeding tube daily. 03/22/23   Sheril Dines, MD  clonazePAM  (KLONOPIN ) 0.5 MG tablet Place 1 tablet (0.5 mg total) into feeding tube 2 (two) times daily. 03/22/23   Sheril Dines, MD  Cyanocobalamin  (VITAMIN B-12) 5000 MCG TBDP Place 10,000 mcg into feeding tube daily. 03/22/23   Sheril Dines, MD  diltiazem  (CARDIZEM  CD) 300 MG 24 hr capsule Take 300 mg by mouth daily.    [provider]  diphenoxylate -atropine  (LOMOTIL ) 2.5-0.025 MG tablet Place 1 tablet into feeding tube 4 (four) times daily. 05/03/23   Wouk, Haynes Lips, MD  ezetimibe  (ZETIA ) 10  MG tablet Place 1 tablet (10 mg total) into feeding tube at bedtime. 03/22/23   Sheril Dines, MD  ferrous sulfate  300 (60 Fe) MG/5ML syrup Place 5 mLs (300 mg total) into feeding tube daily. Patient taking differently: Place 6.8 mLs into feeding tube daily. 03/22/23   Sheril Dines, MD  fiber supplement, BANATROL TF, liquid Place 60 mLs into feeding tube 2 (two) times daily. 05/03/23   Wouk, Haynes Lips, MD  finasteride  (PROSCAR ) 5 MG tablet Take 1 tablet by mouth daily. 02/25/23   [provider]  furosemide  (LASIX ) 40 MG tablet Take 40 mg by mouth daily. Patient not taking: Reported on 08/08/2023    [provider]  gabapentin  (NEURONTIN ) 300 MG capsule Take 1 capsule by mouth at bedtime. 02/14/23   [provider]  HUMALOG 100 UNIT/ML injection Inject 0-14 Units into the skin 3 (three) times daily with meals. Sliding scale 05/27/23   [provider]  Infant [employer] [facility]) OINT Apply 1 Application topically daily as needed.    [provider]  insulin  glargine (LANTUS ) 100 UNIT/ML injection Inject 40 Units into the skin 2 (two) times daily. 03/27/23   [provider]  isosorbide  mononitrate (IMDUR ) 60 MG 24 hr tablet Take 60 mg by mouth in the morning and at bedtime. Patient not taking: Reported on 08/08/2023    [provider]  levothyroxine  (SYNTHROID ) 25 MCG tablet Place 1 tablet (25 mcg total) into feeding tube daily at 6 (six) AM. 03/22/23   Sheril Dines, MD  lidocaine -prilocaine  (EMLA ) cream Apply 1 Application topically as needed. 05/28/23   Brahmanday, Govinda R, MD  loperamide  HCl (IMODIUM ) 1 MG/7.5ML suspension Place 15 mLs (2 mg total) into feeding tube every 6 (six) hours as needed for diarrhea or loose stools. 05/03/23   Wouk, Haynes Lips, MD  losartan  (COZAAR ) 50 MG tablet Take 50 mg by mouth daily. Patient not taking: Reported on 08/08/2023    [provider]  Multiple Vitamins-Minerals (MULTIVITAMIN  WITH MINERALS) tablet Take 1 tablet by mouth daily.    [provider]  nitrofurantoin , macrocrystal-monohydrate, (MACROBID ) 100 MG capsule Take 100 mg by mouth daily.    [provider]  nitroGLYCERIN  (NITROSTAT ) 0.4 MG SL tablet Place 1 tablet (0.4 mg total) under the tongue every 5 (five) minutes as needed for chest pain. 06/20/22   Wenona Hamilton, MD  Nutritional Supplements (FEEDING SUPPLEMENT, NEPRO CARB STEADY,) LIQD Place 237 mLs into feeding tube 6 (six) times daily. 07/08/23   Montey Apa, DO  nystatin  (MYCOSTATIN /NYSTOP ) powder Apply 1 Application topically 2 (two) times daily. Under belly    [provider]  ondansetron  (ZOFRAN ) 4 MG tablet Place 1 tablet (4 mg total) into feeding tube every 8 (eight) hours as needed for nausea. 05/28/23   Brahmanday, Govinda R, MD  polyethylene glycol (MIRALAX  / GLYCOLAX ) 17 g packet Place 17 g into feeding tube daily as needed for moderate constipation. 07/08/23   Montey Apa, DO  pramipexole  (MIRAPEX ) 1 MG tablet Place 2 tablets (2 mg total) into feeding tube at bedtime. 03/22/23   Sheril Dines, MD  primidone  (MYSOLINE ) 250 MG tablet Place 1 tablet (250 mg total) into feeding tube 2 (two) times daily. 03/22/23   Sheril Dines, MD  tamsulosin  (FLOMAX ) 0.4 MG CAPS capsule Take 0.4 mg by mouth.    [provider]  Vitamins A & D (VITAMIN A & D) ointment Apply 1 Application topically as needed for dry skin. Apply to stumps    [provider]  Water  For Irrigation, Sterile (FREE WATER ) SOLN Place 80 mLs into feeding tube 6 (six) times daily. 07/08/23   Darus Engels A, DO  zinc  sulfate, 50mg  elemental zinc , 220 (50 Zn) MG capsule Take 220 mg by mouth daily.    [provider]    Physical Exam: Vitals:   08/20/23 2245 08/20/23 2315 08/20/23 2330 08/21/23 0010  BP:  106/61 98/60   Pulse:  (!) 125 (!) 123   Resp:  (!) 4 16   Temp: 98.5 F (36.9 C)   (!) 101.1 F (38.4 C)  TempSrc:  Oral   Oral  SpO2:  95% 94%   Height:       Physical Exam Vitals and nursing note reviewed.  Constitutional:  General: He is not in acute distress.    Comments: Chronically ill-appearing male, pale, lethargic  HENT:     Head: Normocephalic and atraumatic.  Cardiovascular:     Rate and Rhythm: Regular rhythm. Tachycardia present.     Heart sounds: Normal heart sounds.  Pulmonary:     Effort: Pulmonary effort is normal.     Breath sounds: Normal breath sounds.  Abdominal:     Palpations: Abdomen is soft.     Tenderness: There is no abdominal tenderness.  Neurological:     General: No focal deficit present.     Labs on Admission: I have personally reviewed following labs and imaging studies  CBC: Recent Labs  Lab 08/20/23 2247  WBC 12.8*  NEUTROABS 11.0*  HGB 7.0*  HCT 23.2*  MCV 93.2  PLT 279   Basic Metabolic Panel: Recent Labs  Lab 08/20/23 2247  NA 138  K 3.8  CL 104  CO2 25  GLUCOSE 235*  BUN 59*  CREATININE 0.95  CALCIUM  8.2*   GFR: CrCl cannot be calculated (Unknown ideal weight.). Liver Function Tests: Recent Labs  Lab 08/20/23 2247  AST 26  ALT 37  ALKPHOS 113  BILITOT 0.4  PROT 6.1*  ALBUMIN  2.4*   No results for input(s): "LIPASE", "AMYLASE" in the last 168 hours. No results for input(s): "AMMONIA" in the last 168 hours. Coagulation Profile: No results for input(s): "INR", "PROTIME" in the last 168 hours. Cardiac Enzymes: No results for input(s): "CKTOTAL", "CKMB", "CKMBINDEX", "TROPONINI" in the last 168 hours. BNP (last 3 results) No results for input(s): "PROBNP" in the last 8760 hours. HbA1C: No results for input(s): "HGBA1C" in the last 72 hours. CBG: No results for input(s): "GLUCAP" in the last 168 hours. Lipid Profile: No results for input(s): "CHOL", "HDL", "LDLCALC", "TRIG", "CHOLHDL", "LDLDIRECT" in the last 72 hours. Thyroid  Function Tests: No results for input(s): "TSH", "T4TOTAL", "FREET4", "T3FREE", "THYROIDAB"  in the last 72 hours. Anemia Panel: No results for input(s): "VITAMINB12", "FOLATE", "FERRITIN", "TIBC", "IRON", "RETICCTPCT" in the last 72 hours. Urine analysis:    Component Value Date/Time   COLORURINE YELLOW (A) 06/29/2023 1247   APPEARANCEUR HAZY (A) 06/29/2023 1247   APPEARANCEUR Cloudy (A) 06/03/2023 1440   LABSPEC 1.014 06/29/2023 1247   PHURINE 5.0 06/29/2023 1247   GLUCOSEU NEGATIVE 06/29/2023 1247   HGBUR NEGATIVE 06/29/2023 1247   BILIRUBINUR NEGATIVE 06/29/2023 1247   BILIRUBINUR Negative 06/03/2023 1440   KETONESUR NEGATIVE 06/29/2023 1247   PROTEINUR NEGATIVE 06/29/2023 1247   NITRITE NEGATIVE 06/29/2023 1247   LEUKOCYTESUR TRACE (A) 06/29/2023 1247    Radiological Exams on Admission: DG Chest Port 1 View Result Date: 08/20/2023 CLINICAL DATA:  Shortness of breath EXAM: PORTABLE CHEST 1 VIEW COMPARISON:  07/15/2023 FINDINGS: Right chest wall Port-A-Cath tip in the mid SVC. Stable cardiomediastinal silhouette. Sternotomy and CABG. Ectasias of the ascending aorta. Interstitial and patchy airspace opacities in the left lower lung. No pleural effusion or pneumothorax. IMPRESSION: Interstitial and patchy airspace opacities in the left lower lung may atelectasis or pneumonia. Electronically Signed   By: Rozell Cornet M.D.   On: 08/20/2023 22:48     Data Reviewed: Relevant notes from primary care and specialist visits, past discharge summaries as available in EHR, including Care Everywhere. Prior diagnostic testing as pertinent to current admission diagnoses Updated medications and problem lists for reconciliation ED course, including vitals, labs, imaging, treatment and response to treatment Triage notes, nursing and pharmacy notes and ED provider's notes Notable results as  noted in HPI   Assessment and Plan: * Sepsis due to pneumonia( HCAP vs aspiration) (HCC) Acute respiratory failure with hypoxia-history of mechanical ventilation 06/29/23) COPD exacerbation Sepsis  criteria include fever, tachycardia, leukocytosis, hypotension, hypoxia-O2 sat 88% on room air with EMS Suspected source is respiratory but patient does have history of Klebsiella UTI 03/2023 and C. difficile 03/2023 Cefepime  and vancomycin  to treat pneumonia Follow urinalysis Sepsis fluids with monitoring for fluid overload in view of CHF history Schedule and as needed nebulized bronchodilators Hold steroids due to possible acute infection Antitussives Supplemental oxygen  to keep sats over 92% Patient is acutely ill with multiple chronic comorbidities and at high risk of clinical decompensation especially in view of recent need for mechanical ventilation  Acute blood loss anemia Melena, with history of esophageal cancer Chronic anticoagulation Hemoglobin 7 and becoming hypotensive following admission Will transfuse 1 unit PRBC Serial H&H and transfuse if needed GI consult Keep n.p.o./nothing per PEG  Perinephric hematoma Chronic perinephric hematoma with renal mass Chronic anticoagulation Hemoglobin 7, down from 10 months prior CT abdomen from 08/08/23 ordered by urology showed: "No change in the size of the complex bilobed fluid collection in the right posterior retroperitoneal soft tissues extending into the adjacent iliopsoas muscle and right lumbar muscle. Collection appears better defined more hypodense and could correlate with resolving hematoma" Will get urinalysis Repeat CT abdomen and pelvis if H/H downtrending Hold systemic anticoagulation for now pending stability of hemoglobin  Uncontrolled type 2 diabetes mellitus with hyperglycemia, with long-term current use of insulin  (HCC) Blood glucose 235 Continue basal insulin  Sliding scale insulin  coverage  Paroxysmal atrial fibrillation (HCC) Holding anticoagulation due to suspected acute blood loss Continue amiodarone  Hold diltiazem  due to soft blood pressures  Malignant neoplasm of lower third of esophagus (HCC) G-tube  dependent No acute issues suspected Dietary consult for tube feeds  Essential hypertension Currently hypotensive Holding home antihypertensives of diltiazem , furosemide , losartan  due to soft blood pressure related to sepsis  CAD with Hx of CABG Peripheral artery disease s/p bilateral BKA Holding Eliquis , due to anemia  Continue ezetimibe  Supportive care for transfers and fall precautions   OSA (obstructive sleep apnea) CPAP nightly  Obesity, Class III, BMI 40-49.9 (morbid obesity) (HCC) Complicating factor to overall prognosis and care  Chronic diastolic CHF (congestive heart failure) (HCC) Clinically euvolemic but close monitoring for fluid overload in view of sepsis fluids Holding Lasix , isosorbide , losartan  because of low blood pressures  BPH (benign prostatic hyperplasia) History of acute urinary retention History of Klebsiella UTI 03/2023 Previously on UTI prophylaxis nitrofurantoin  No acute issues at the moment Continue finasteride  and tamsulosin  Last seen by urologist 08/14/2023 Follow urinalysis to evaluate for possible UTI    DVT prophylaxis: SCD  Consults: GI Dr. Emerick Hanlon  Advance Care Planning:   Code Status: Prior   Family Communication: Wife at bedside  Disposition Plan: Back to previous home environment  Severity of Illness: The appropriate patient status for this patient is INPATIENT. Inpatient status is judged to be reasonable and necessary in order to provide the required intensity of service to ensure the patient's safety. The patient's presenting symptoms, physical exam findings, and initial radiographic and laboratory data in the context of their chronic comorbidities is felt to place them at high risk for further clinical deterioration. Furthermore, it is not anticipated that the patient will be medically stable for discharge from the hospital within 2 midnights of admission.   * I certify that at the point of admission it is my clinical  judgment  that the patient will require inpatient hospital care spanning beyond 2 midnights from the point of admission due to high intensity of service, high risk for further deterioration and high frequency of surveillance required.*   CRITICAL CARE Performed by: Lanetta Pion   Total critical care time: 80 minutes  Critical care time was exclusive of separately billable procedures and treating other patients.  Critical care was necessary to treat or prevent imminent or life-threatening deterioration.  Critical care was time spent personally by me on the following activities: development of treatment plan with patient and/or surrogate as well as nursing, discussions with consultants, evaluation of patient's response to treatment, examination of patient, obtaining history from patient or surrogate, ordering and performing treatments and interventions, ordering and review of laboratory studies, ordering and review of radiographic studies, pulse oximetry and re-evaluation of patient's condition.  Author: Lanetta Pion, MD 08/21/2023 1:39 AM  For on call review [email or web address].

## 2023-08-21 NOTE — Assessment & Plan Note (Deleted)
 Chronic perinephric hematoma with renal mass Chronic anticoagulation Hemoglobin 7, down from 10 months prior CT abdomen from 08/19/23 ordered by urology showed: "No change in the size of the complex bilobed fluid collection in the right posterior retroperitoneal soft tissues extending into the adjacent iliopsoas muscle and right lumbar muscle. Collection appears better defined more hypodense and could correlate with resolving hematoma" Will get urinalysis Serial H&H and transfuse if needed Repeat CT abdomen and pelvis if H/H downtrending Hold systemic anticoagulation for now pending stability of hemoglobin

## 2023-08-21 NOTE — Op Note (Signed)
 [facility] Gastroenterology Patient Name: [name] Procedure Date: 08/21/2023 3:57 PM MRN: [id number] Account #: [id number] Date of Birth: 24-Feb-1956 Admit Type: Inpatient Age: 68 Room: [facility] ENDO ROOM 2 Gender: Male Note Status: Finalized Instrument Name: Upper Endoscope [id number] Procedure:             Upper GI endoscopy Indications:           Melena Providers:             Marnee Sink MD, MD Medicines:             Propofol  per Anesthesia Complications:         No immediate complications. Procedure:             Pre-Anesthesia Assessment:                        - Prior to the procedure, a History and Physical was                         performed, and patient medications and allergies were                         reviewed. The patient's tolerance of previous                         anesthesia was also reviewed. The risks and benefits                         of the procedure and the sedation options and risks                         were discussed with the patient. All questions were                         answered, and informed consent was obtained. Prior                         Anticoagulants: The patient has taken Eliquis                          (apixaban ), last dose was 1 day prior to procedure.                         ASA Grade Assessment: III - A patient with severe                         systemic disease. After reviewing the risks and                         benefits, the patient was deemed in satisfactory                         condition to undergo the procedure.                        After obtaining informed consent, the endoscope was                         passed under direct vision. Throughout the  procedure,                         the patient's blood pressure, pulse, and oxygen                          saturations were monitored continuously. The Endoscope                         was introduced through the mouth, with the intention                          of advancing to the duodenum. The scope was advanced                         to the second part of the duodenum before the                         procedure was aborted. Medications were given. The                         patient tolerated the procedure well. Findings:      A large, fungating mass with no bleeding and no stigmata of recent       bleeding was found in the lower third of the esophagus. The mass was       completely obstructing and circumferential. Impression:            - Completely obstructing, malignant esophageal tumor                         was found in the lower third of the esophagus.                        - No specimens collected. Recommendation:        - Return patient to hospital ward for ongoing care.                        - Resume previous diet.                        - Consider CT angio with vascular surgery embolization                         if positive.                        - No ibuprofen, naproxen, or other non-steroidal                         anti-inflammatory drugs.                        - Use a proton pump inhibitor IV BID. Procedure Code(s):     --- Professional ---                        [phone], 52, Esophagogastroduodenoscopy, flexible,                         transoral; diagnostic, including collection of  specimen(s) by brushing or washing, when performed                         (separate procedure) Diagnosis Code(s):     --- Professional ---                        K92.1, Melena (includes Hematochezia)                        C15.5, Malignant neoplasm of lower third of esophagus CPT copyright 2022 [employer]. All rights reserved. The codes documented in this report are preliminary and upon coder review may  be revised to meet current compliance requirements. Marnee Sink MD, MD 08/21/2023 4:14:19 PM This report has been signed electronically. Number of Addenda: 0 Note Initiated On:  08/21/2023 3:57 PM Estimated Blood Loss:  Estimated blood loss: none.      [facility]

## 2023-08-21 NOTE — Assessment & Plan Note (Addendum)
 G-tube dependent No acute issues suspected Dietary consult for tube feeds

## 2023-08-21 NOTE — Assessment & Plan Note (Addendum)
 Acute respiratory failure with hypoxia-history of mechanical ventilation 06/29/23) COPD exacerbation Sepsis criteria include fever, tachycardia, leukocytosis, hypotension, hypoxia-O2 sat 88% on room air with EMS Suspected source is respiratory but patient does have history of Klebsiella UTI 03/2023 and C. difficile 03/2023 Cefepime  and vancomycin  to treat pneumonia Follow urinalysis Sepsis fluids with monitoring for fluid overload in view of CHF history Schedule and as needed nebulized bronchodilators Hold steroids due to possible acute infection Antitussives Supplemental oxygen  to keep sats over 92% Patient is acutely ill with multiple chronic comorbidities and at high risk of clinical decompensation especially in view of recent need for mechanical ventilation

## 2023-08-21 NOTE — Anesthesia Preprocedure Evaluation (Signed)
 Anesthesia Evaluation  Patient identified by MRN, date of birth, ID band Patient confused    Reviewed: Allergy & Precautions, NPO status , Patient's Chart, lab work & pertinent test results  Airway Mallampati: III  TM Distance: <3 FB Neck ROM: full    Dental  (+) Chipped, Poor Dentition, Missing   Pulmonary asthma , sleep apnea , COPD, former smoker   Pulmonary exam normal        Cardiovascular hypertension, + CAD, + Past MI, + Peripheral Vascular Disease and +CHF  + dysrhythmias Atrial Fibrillation      Neuro/Psych  PSYCHIATRIC DISORDERS       Neuromuscular disease    GI/Hepatic negative GI ROS, Neg liver ROS,,,  Endo/Other  diabetesHypothyroidism    Renal/GU      Musculoskeletal   Abdominal   Peds  Hematology  (+) Blood dyscrasia, anemia   Anesthesia Other Findings Past Medical History: No date: Arrhythmia     Comment:  atrial fibrillation No date: Asthma No date: CHF (congestive heart failure) (HCC) No date: COPD (chronic obstructive pulmonary disease) (HCC) No date: Coronary artery disease No date: Depression No date: Diabetes mellitus without complication (HCC) No date: Gout No date: History anabolic steroid use No date: Hyperlipidemia No date: Hypertension No date: Hypogonadism in male No date: MI (myocardial infarction) (HCC) No date: Morbid obesity (HCC) No date: Myocardial infarction (HCC) No date: Peripheral vascular disease (HCC) No date: Perirectal abscess No date: Pleurisy No date: Sleep apnea     Comment:  CPAP at night, no oxygen  No date: Varicella  Past Surgical History: No date: ABDOMINAL AORTIC ANEURYSM REPAIR 01/10/2021: ACHILLES TENDON SURGERY; Left     Comment:  Procedure: ACHILLES LENGTHENING/KIDNER;  Surgeon: Pink Bridges, DPM;  Location: [facility] ORS;  Service: Podiatry;                Laterality: Left; 10/03/2021: AMPUTATION; Left     Comment:  Procedure:  AMPUTATION BELOW KNEE;  Surgeon: Jackquelyn Mass, MD;  Location: [facility] ORS;  Service: Vascular;                Laterality: Left; 05/24/2022: AMPUTATION; Right     Comment:  Procedure: AMPUTATION BELOW KNEE;  Surgeon: Celso College, MD;  Location: [facility] ORS;  Service: General;                Laterality: Right; 02/10/2016: AMPUTATION TOE; Right     Comment:  Procedure: AMPUTATION TOE 3RD TOE;  Surgeon: Anell Baptist, DPM;  Location: [facility] ORS;  Service: Podiatry;                Laterality: Right; 02/24/2020: AMPUTATION TOE; Left     Comment:  Procedure: AMPUTATION TOE;  Surgeon: Pink Bridges, DPM;              Location: [facility] ORS;  Service: Podiatry;  Laterality:               Left; 02/29/2020: APPLICATION OF WOUND VAC; Left     Comment:  Procedure: APPLICATION OF WOUND VAC;  Surgeon: Pink Bridges, DPM;  Location: [facility]  ORS;  Service: Podiatry;                Laterality: Left; 03/15/2023: BIOPSY     Comment:  Procedure: BIOPSY;  Surgeon: Luke Salaam, MD;  Location:              [facility] ENDOSCOPY;  Service: Gastroenterology;; 04/10/2023: BIOPSY     Comment:  Procedure: BIOPSY;  Surgeon: Corky Diener, Alphonsus Jeans, MD;                Location: [facility] ENDOSCOPY;  Service: Gastroenterology;; No date: CARDIAC CATHETERIZATION 02/13/2022: CARDIOVERSION; [name]     Comment:  Procedure: CARDIOVERSION;  Surgeon: Michelle Aid,               MD;  Location: [facility] ORS;  Service: Cardiovascular;                Laterality: [name]; 11/18/2015: COLONOSCOPY WITH PROPOFOL ; [name]     Comment:  Procedure: COLONOSCOPY WITH PROPOFOL ;  Surgeon: Cassie Click, MD;  Location: [facility] ENDOSCOPY;  Service:               Endoscopy;  Laterality: [name]; No date: CORONARY ARTERY BYPASS GRAFT 02/02/2020: CORONARY STENT INTERVENTION; [name]     Comment:  Procedure: CORONARY STENT INTERVENTION;  Surgeon:               Percival Brace, MD;  Location: [facility] INVASIVE CV                LAB;  Service: Cardiovascular;  Laterality: [name]; 03/15/2023: ESOPHAGOGASTRODUODENOSCOPY (EGD) WITH PROPOFOL ; [name]     Comment:  Procedure: ESOPHAGOGASTRODUODENOSCOPY (EGD) WITH               PROPOFOL ;  Surgeon: Luke Salaam, MD;  Location: [facility]               ENDOSCOPY;  Service: Gastroenterology;  Laterality: [name]; 04/10/2023: FLEXIBLE SIGMOIDOSCOPY; [name]     Comment:  Procedure: FLEXIBLE SIGMOIDOSCOPY;  Surgeon: [place],               Alphonsus Jeans, MD;  Location: [facility] ENDOSCOPY;  Service:               Gastroenterology;  Laterality: [name]; 03/18/2023: GASTROSTOMY; [name]     Comment:  Procedure: INSERTION OF GASTROSTOMY TUBE;  Surgeon:               Eldred Grego, MD;  Location: [facility] ORS;  Service:              General;  Laterality: [name]; 03/15/2023: IMPACTION REMOVAL     Comment:  Procedure: IMPACTION REMOVAL;  Surgeon: Luke Salaam, MD;              Location: [facility] ENDOSCOPY;  Service: Gastroenterology;; 08/07/2021: INCISION AND DRAINAGE; Left     Comment:  Procedure: INCISION AND DRAINAGE-Partial Calcanectomy;                Surgeon: Pink Bridges, DPM;  Location: [facility] ORS;                Service: Podiatry;  Laterality: Left; 02/29/2020: IRRIGATION AND DEBRIDEMENT FOOT; Left     Comment:  Procedure: IRRIGATION AND DEBRIDEMENT FOOT;  Surgeon:               Pink Bridges, DPM;  Location: [facility] ORS;  Service:  Podiatry;  Laterality: Left; 02/24/2020: IRRIGATION AND DEBRIDEMENT FOOT; Left     Comment:  Procedure: IRRIGATION AND DEBRIDEMENT FOOT;  Surgeon:               Pink Bridges, DPM;  Location: [facility] ORS;  Service:               Podiatry;  Laterality: Left; No date: KNEE ARTHROSCOPY 02/02/2020: LEFT HEART CATH AND CORS/GRAFTS ANGIOGRAPHY; [name]     Comment:  Procedure: LEFT HEART CATH AND CORS/GRAFTS ANGIOGRAPHY;               Surgeon: Ronney Cola, MD;  Location: [facility] INVASIVE CV              LAB;  Service: Cardiovascular;  Laterality: [name]; 02/25/2020: LOWER  EXTREMITY ANGIOGRAPHY; Left     Comment:  Procedure: Lower Extremity Angiography;  Surgeon: Celso College, MD;  Location: [facility] INVASIVE CV LAB;  Service:               Cardiovascular;  Laterality: Left; 01/04/2021: LOWER EXTREMITY ANGIOGRAPHY; Left     Comment:  Procedure: LOWER EXTREMITY ANGIOGRAPHY;  Surgeon: Celso College, MD;  Location: [facility] INVASIVE CV LAB;  Service:               Cardiovascular;  Laterality: Left; 05/21/2022: LOWER EXTREMITY ANGIOGRAPHY; Right     Comment:  Procedure: Lower Extremity Angiography;  Surgeon: Celso College, MD;  Location: [facility] INVASIVE CV LAB;  Service:               Cardiovascular;  Laterality: Right; 01/10/2021: METATARSAL HEAD EXCISION; Left     Comment:  Procedure: METATARSAL HEAD EXCISION - LEFT 5th;                Surgeon: Pink Bridges, DPM;  Location: [facility] ORS;                Service: Podiatry;  Laterality: Left; 01/24/2016: PERIPHERAL VASCULAR CATHETERIZATION; Right     Comment:  Procedure: Lower Extremity Angiography;  Surgeon:               Jackquelyn Mass, MD;  Location: [facility] INVASIVE CV LAB;                Service: Cardiovascular;  Laterality: Right; 01/25/2016: PERIPHERAL VASCULAR CATHETERIZATION; Right     Comment:  Procedure: Lower Extremity Angiography;  Surgeon:               Jackquelyn Mass, MD;  Location: [facility] INVASIVE CV LAB;                Service: Cardiovascular;  Laterality: Right; 03/18/2023: PORTACATH PLACEMENT; [name]     Comment:  Procedure: INSERTION PORT-A-CATH;  Surgeon:               Eldred Grego, MD;  Location: [facility] ORS;  Service:              General;  Laterality: [name]; No date: TOE AMPUTATION No date: TONSILLECTOMY  BMI    Body Mass Index: 44.17 kg/m      Reproductive/Obstetrics negative OB ROS  Anesthesia Physical Anesthesia Plan  ASA: 4  Anesthesia Plan: General   Post-op Pain Management: Minimal or no  pain anticipated   Induction: Intravenous  PONV Risk Score and Plan: 3 and Propofol  infusion, TIVA and Ondansetron   Airway Management Planned: Nasal Cannula  Additional Equipment: None  Intra-op Plan:   Post-operative Plan:   Informed Consent: I have reviewed the patients History and Physical, chart, labs and discussed the procedure including the risks, benefits and alternatives for the proposed anesthesia with the patient or authorized representative who has indicated his/her understanding and acceptance.     Dental advisory given  Plan Discussed with: CRNA and Surgeon  Anesthesia Plan Comments: (Discussed risks of anesthesia with patient, including possibility of difficulty with spontaneous ventilation under anesthesia necessitating airway intervention, PONV, and rare risks such as cardiac or respiratory or neurological events, and allergic reactions. Discussed the role of CRNA in patient's perioperative care. Patient understands.)       Anesthesia Quick Evaluation

## 2023-08-21 NOTE — Progress Notes (Signed)
 Progress Note   Patient: [name]. ZOX:[id number] DOB: 05/28/1955 DOA: 08/20/2023     0 DOS: the patient was seen and examined on 08/21/2023   Brief hospital course: 67yo with h/o CAD, afib, chronic HFpEF, esophageal CA s/p chemo/rads with G-tube dependence, PAD s/p B AKA, DM, renal mass with chronic hematuria (last seen by urology 4/16), OSA, and morbid obesity who presented on 4/22 with SOB.  He was last hospitalized from 3/1-10 with respiratory failure from RSV and/or aspiration PNA requiring intubation but was weaned off O2 prior to dc.  Sat 88% on RA.  Reported 4 days of black stools, Hgb 7 (10 a month ago).  CXR with interstitial and patchy infiltrates in LLL, started on Cefepime  and Vanc for HCAP.  Transfused 1 unit PRBC overnight on 4/23.   Assessment and Plan:  Acute blood loss anemia Melena, with history of esophageal cancer/UGI bleed Hemoglobin 7 and becoming hypotensive following admission Transfused 1 unit PRBC and then 2 more for Hgb goal >10 GI consult Keep n.p.o./nothing per PEG EGD performed - completely obstructing, malignant esophageal tumor in the lower third of the esophagus IV Protonix  BID No NSAIDs indefinitely Hold Eliquis  - uncertain if this is related but it certainly won't help if he has a bleed If significant ongoing bleeding, can try a CTA with vascular surgery embolization   Possible sepsis with acute hypoxic respiratory failure Recent admission with PNA requiring mechanical ventilation Sepsis criteria include fever to 101.1, tachycardia, leukocytosis, hypotension, hypoxia-O2 sat 88% on room air with EMS Given known malignancy and active GI bleed, infection is uncertain; hypoxia may be related to anemia CXR with LLL opacities but patient has no respiratory/URI symptoms (just the hypoxia as above) Patient does have history of Klebsiella UTI 03/2023 and C. difficile 03/2023 but UA with 30 protein, large LE, otherwise WNL so not obviously  infected Cefepime  and vancomycin  ordered to treat but pharmacy was concerned about vanc dosing in the setting of B AKA and given ? Of infection vs. Bleed and cancer-associated fever, will hold Vanc for now Continue empiric Cefepime  Supplemental oxygen  to keep sats over 92% Patient is acutely ill with multiple chronic comorbidities and at high risk of clinical decompensation especially in view of recent need for mechanical ventilation   Perinephric hematoma Chronic perinephric hematoma with renal mass Hemoglobin 7, down from 10 months prior CT abdomen from 08/08/23 ordered by urology showed: "No change in the size of the complex bilobed fluid collection in the Wood posterior retroperitoneal soft tissues extending into the adjacent iliopsoas muscle and Wood lumbar muscle. Collection appears better defined more hypodense and could correlate with resolving hematoma" Hold systemic anticoagulation for now pending stability of hemoglobin "Given his terminal cancer diagnosis and comorbidities, conservative management is recommended"   Malignant neoplasm of lower third of esophagus Stage T4N1M1 stage 4 distal esophageal cancer, now completely obstructing esophagus despite recent completion of radiation, unresectable Considered to be a poor candidate for systemic therapy but may reconsider after PET scan in 2-3 months G-tube dependent - hold tube feeds during active GI bleed His prognosis is very poor Despite this, he remains full code Realistically, he appears to be a hospice candidate Will request that Dr. Valentine Gasmen and Jerrilyn Moras Borders both consult   Uncontrolled type 2 diabetes mellitus with hyperglycemia, with long-term current use of insulin  Last A1c was 7.8, suboptimal control Continue basal insulin  Sliding scale insulin  coverage   Paroxysmal atrial fibrillation  Holding anticoagulation due to suspected acute blood loss  Continue amiodarone  for rate control Hold diltiazem  due to soft blood  pressures (although cardiology notes indicate that he is on metoprolol  rather than diltiazem )  Essential hypertension Currently hypotensive Holding home antihypertensives of diltiazem , furosemide  due to soft blood pressure related to sepsis   CAD/PAD S/p CABG x 2 2006, PCI with DES 2021 s/p bilateral AKA Holding Eliquis , due to anemia  Hold ezetimibe  Supportive care for transfers and fall precautions He has been unable to transfer himself since 03/2023 Continue Klonopin , Primidone , Mirapex    OSA (obstructive sleep apnea) CPAP nightly   Obesity, Class III, BMI 40-49.9 (morbid obesity)  Complicating factor to overall prognosis and care   Chronic diastolic CHF (congestive heart failure) (HCC) Clinically euvolemic but close monitoring for fluid overload in view of sepsis fluids Holding Lasix , isosorbide , losartan  because of low blood pressures   BPH (benign prostatic hyperplasia) History of Klebsiella UTI 03/2023 UTI prophylaxis with nitrofurantoin  (can resume once he is no longer on Cefepime ) Continue finasteride  and tamsulosin  Last seen by urologist 08/14/2023 Follow urinalysis to evaluate for possible UTI  Hypothyroidism Continue Synthroid      Consultants: GI Oncology Palliative care  Procedures: EGD 4/23  Antibiotics: Cefepime  4/22- Vancomycin  4/22-  30 Day Unplanned Readmission Risk Score    Flowsheet Row ED to Hosp-Admission (Current) from 08/20/2023 in [facility] Emergency Department at [facility]  30 Day Unplanned Readmission Risk Score (%) 55.33 Filed at 08/21/2023 0801       This score is the patient's risk of an unplanned readmission within 30 days of being discharged (0 -100%). The score is based on dignosis, age, lab data, medications, orders, and past utilization.   Low:  0-14.9   Medium: 15-21.9   High: 22-29.9   Extreme: 30 and above          Subjective: Patient is somnolent but able to answer questions.  Wife provided much of the  history.  Her sister works as a Surveyor, minerals here and was also present.  He started with black stools on Saturday persisting through the weekend.  He finished his radiation therapy within the last month.  He is a full code.  Physical Exam: Vitals:   08/21/23 1648 08/21/23 1747 08/21/23 1800 08/21/23 1813  BP: 109/69  (!) 105/55   Pulse:   89 91  Resp:   (!) 21 15  Temp:  98.5 F (36.9 C)    TempSrc:  Oral    SpO2:   95% 95%  Height:        Intake/Output Summary (Last 24 hours) at 08/21/2023 1831 Last data filed at 08/21/2023 1610 Gross per 24 hour  Intake 485 ml  Output --  Net 485 ml   There were no vitals filed for this visit.  Exam:  General:  Appears acutely on chronically ill, very pale Eyes:   EOMI, normal lids, iris, pale conjunctivae ENT:  grossly normal hearing, lips & tongue, mmm Cardiovascular:  RRR.  Respiratory:   CTA bilaterally with no wheezes/rales/rhonchi.  Normal respiratory effort. Abdomen:  soft, NT, ND; G tube in place with melena noted when pulled back Skin:  no rash or induration seen on limited exam Musculoskeletal:  s/p B AKA; stumps C/D/I Psychiatric: blunted/somnolent mood and affect, speech sparse but appropriate Neurologic:  CN 2-12 grossly intact, moves all extremities in coordinated fashion  Data Reviewed: I have reviewed the patient's lab results since admission.  Pertinent labs for today include:  Glucose 269 BUN 70, up from 32 on 3/24  CBC 7 -> 6 (after 1 unit); 10.1 on 3/24     Family Communication: Wife and her sister were present throughout evaluation  Disposition: Status is: Inpatient Remains inpatient appropriate because: ongoing management  Planned Discharge Destination:  to be determined    Time spent: 50 minutes  Author: Lorita Rosa, MD 08/21/2023 6:31 PM  For on call review [email or web address].

## 2023-08-21 NOTE — Assessment & Plan Note (Signed)
 Complicating factor to overall prognosis and care

## 2023-08-21 NOTE — ED Notes (Signed)
 CCMD notified of pt transport to the floor

## 2023-08-21 NOTE — ED Notes (Signed)
 New bag of LR was started, upon arrival- noted that pump was turned off by someone and did not notify primary RN.

## 2023-08-21 NOTE — Assessment & Plan Note (Addendum)
 Melena, with history of esophageal cancer Chronic anticoagulation Hemoglobin 7 and becoming hypotensive following admission Will transfuse 1 unit PRBC Serial H&H and transfuse if needed GI consult Keep n.p.o./nothing per PEG

## 2023-08-21 NOTE — Assessment & Plan Note (Signed)
 CPAP nightly

## 2023-08-21 NOTE — Assessment & Plan Note (Addendum)
 Currently hypotensive Holding home antihypertensives of diltiazem , furosemide , losartan  due to soft blood pressure related to sepsis

## 2023-08-21 NOTE — TOC CM/SW Note (Signed)
 12:45pm  CSW attempted to complete readmit assessment.  Patient was sleeping.

## 2023-08-21 NOTE — ED Notes (Signed)
 Pt endorsing 8/10 CP to the L side.

## 2023-08-21 NOTE — Consult Note (Signed)
 Marnee Sink, MD [facility]  [address], [address] [place], [place] [address] Phone: [phone] Fax : [phone]  Consultation  Referring Provider:     Dr. Vallarie Gauze Primary Care Physician:  Yehuda Helms, MD Primary Gastroenterologist: [facility]         Reason for Consultation:     GI bleed  Date of Admission:  08/20/2023 Date of Consultation:  08/21/2023           HPI:   Raylin Winer. is a 68 y.o. male who had an upper endoscopy due to dysphagia by Dr. Antony Baumgartner after being found to have retained food material on a barium swallow with weight loss and dysphagia to liquids and solids.  The patient underwent a upper endoscopy in November 2020 for that showed an esophageal mass. The patient presented to the emergency department with shortness of breath with a history of CHF, coronary artery disease, A-fib, G-tube dependent, diabetes and a renal mass.  The patient was in the hospital earlier this year for respiratory failure from RSV with aspiration pneumonia requiring intubation.  It was reported in the emergency department by the patient's wife that he had been having 4 days of black stools.  His blood pressure was low at 98/60 and his oxygen  saturation was in the mid 90s on 4 L of oxygen . The patient's labs showed:  Component     Latest Ref Rng 07/15/2023 07/22/2023 08/20/2023 08/21/2023  Hemoglobin     13.0 - 17.0 g/dL 9.2 (L)  13.0 (L)  7.0 (L)  6.1 (L)   Hemoglobin      9.9 (L)       His BMP also showed a high BUN to creatinine ratio indicating that this is likely an upper GI bleed.  The patient follows up with urology for a chronic hematoma containing gas. The patient's esophageal cancer was reported to be stage IV esophageal cancer for which she was treated with radiation therapy.  Past Medical History:  Diagnosis Date   Arrhythmia    atrial fibrillation   Asthma    CHF (congestive heart failure) (HCC)    COPD (chronic obstructive pulmonary disease) (HCC)    Coronary artery  disease    Depression    Diabetes mellitus without complication (HCC)    Gout    History anabolic steroid use    Hyperlipidemia    Hypertension    Hypogonadism in male    MI (myocardial infarction) (HCC)    Morbid obesity (HCC)    Myocardial infarction (HCC)    Peripheral vascular disease (HCC)    Perirectal abscess    Pleurisy    Sleep apnea    CPAP at night, no oxygen    Varicella     Past Surgical History:  Procedure Laterality Date   ABDOMINAL AORTIC ANEURYSM REPAIR     ACHILLES TENDON SURGERY Left 01/10/2021   Procedure: ACHILLES LENGTHENING/KIDNER;  Surgeon: Pink Bridges, DPM;  Location: [facility] ORS;  Service: Podiatry;  Laterality: Left;   AMPUTATION Left 10/03/2021   Procedure: AMPUTATION BELOW KNEE;  Surgeon: Jackquelyn Mass, MD;  Location: [facility];  Service: Vascular;  Laterality: Left;   AMPUTATION Right 05/24/2022   Procedure: AMPUTATION BELOW KNEE;  Surgeon: Celso College, MD;  Location: [facility];  Service: General;  Laterality: Right;   AMPUTATION TOE Right 02/10/2016   Procedure: AMPUTATION TOE 3RD TOE;  Surgeon: Anell Baptist, DPM;  Location: [facility];  Service: Podiatry;  Laterality: Right;  AMPUTATION TOE Left 02/24/2020   Procedure: AMPUTATION TOE;  Surgeon: Pink Bridges, DPM;  Location: [facility];  Service: Podiatry;  Laterality: Left;   APPLICATION OF WOUND VAC Left 02/29/2020   Procedure: APPLICATION OF WOUND VAC;  Surgeon: Pink Bridges, DPM;  Location: [facility];  Service: Podiatry;  Laterality: Left;   BIOPSY  03/15/2023   Procedure: BIOPSY;  Surgeon: Luke Salaam, MD;  Location: [facility] ENDOSCOPY;  Service: Gastroenterology;;   BIOPSY  04/10/2023   Procedure: BIOPSY;  Surgeon: Corky Diener, Alphonsus Jeans, MD;  Location: [facility] ENDOSCOPY;  Service: Gastroenterology;;   CARDIAC CATHETERIZATION     CARDIOVERSION [name] 02/13/2022   Procedure: CARDIOVERSION;  Surgeon: Michelle Aid, MD;  Location: [facility];  Service: Cardiovascular;  Laterality: [name];   CARDIOVERSION [name]  04/23/2023   Procedure: CARDIOVERSION;  Surgeon: Devorah Fonder, MD;  Location: [facility];  Service: Cardiovascular;  Laterality: [name];   COLONOSCOPY WITH PROPOFOL  [name] 11/18/2015   Procedure: COLONOSCOPY WITH PROPOFOL ;  Surgeon: Cassie Click, MD;  Location: [facility] ENDOSCOPY;  Service: Endoscopy;  Laterality: [name];   CORONARY ARTERY BYPASS GRAFT     CORONARY STENT INTERVENTION [name] 02/02/2020   Procedure: CORONARY STENT INTERVENTION;  Surgeon: Percival Brace, MD;  Location: [facility] IN[facility]IVE CV LAB;  Service: Cardiovascular;  Laterality: [name];   ESOPHAGOGASTRODUODENOSCOPY (EGD) WITH PROPOFOL  [name] 03/15/2023   Procedure: ESOPHAGOGASTRODUODENOSCOPY (EGD) WITH PROPOFOL ;  Surgeon: Luke Salaam, MD;  Location: [facility] ENDOSCOPY;  Service: Gastroenterology;  Laterality: [name];   FLEXIBLE SIGMOIDOSCOPY [name] 04/10/2023   Procedure: FLEXIBLE SIGMOIDOSCOPY;  Surgeon: Toledo, Alphonsus Jeans, MD;  Location: [facility] ENDOSCOPY;  Service: Gastroenterology;  Laterality: [name];   GASTROSTOMY [name] 03/18/2023   Procedure: INSERTION OF GASTROSTOMY TUBE;  Surgeon: Eldred Grego, MD;  Location: [facility];  Service: General;  Laterality: [name];   IMPACTION REMOVAL  03/15/2023   Procedure: IMPACTION REMOVAL;  Surgeon: Luke Salaam, MD;  Location: [facility] ENDOSCOPY;  Service: Gastroenterology;;   INCISION AND DRAINAGE Left 08/07/2021   Procedure: INCISION AND DRAINAGE-Partial Calcanectomy;  Surgeon: Pink Bridges, DPM;  Location: [facility];  Service: Podiatry;  Laterality: Left;   IRRIGATION AND DEBRIDEMENT FOOT Left 02/29/2020   Procedure: IRRIGATION AND DEBRIDEMENT FOOT;  Surgeon: Pink Bridges, DPM;  Location: [facility];  Service: Podiatry;  Laterality: Left;   IRRIGATION AND DEBRIDEMENT FOOT Left 02/24/2020   Procedure: IRRIGATION AND DEBRIDEMENT FOOT;  Surgeon: Pink Bridges, DPM;  Location: [facility];  Service: Podiatry;  Laterality: Left;   KNEE ARTHROSCOPY     LEFT HEART CATH AND CORS/GRAFTS ANGIOGRAPHY [name] 02/02/2020    Procedure: LEFT HEART CATH AND CORS/GRAFTS ANGIOGRAPHY;  Surgeon: Ronney Cola, MD;  Location: [facility];  Service: Cardiovascular;  Laterality: [name];   LOWER EXTREMITY ANGIOGRAPHY Left 02/25/2020   Procedure: Lower Extremity Angiography;  Surgeon: Celso College, MD;  Location: [facility];  Service: Cardiovascular;  Laterality: Left;   LOWER EXTREMITY ANGIOGRAPHY Left 01/04/2021   Procedure: LOWER EXTREMITY ANGIOGRAPHY;  Surgeon: Celso College, MD;  Location: [facility];  Service: Cardiovascular;  Laterality: Left;   LOWER EXTREMITY ANGIOGRAPHY Right 05/21/2022   Procedure: Lower Extremity Angiography;  Surgeon: Celso College, MD;  Location: [facility];  Service: Cardiovascular;  Laterality: Right;   METATARSAL HEAD EXCISION Left 01/10/2021   Procedure: METATARSAL HEAD EXCISION - LEFT 5th;  Surgeon: Pink Bridges, DPM;  Location: [facility];  Service: Podiatry;  Laterality: Left;   PERIPHERAL VASCULAR CATHETERIZATION Right 01/24/2016   Procedure: Lower Extremity Angiography;  Surgeon:  Jackquelyn Mass, MD;  Location: [facility];  Service: Cardiovascular;  Laterality: Right;   PERIPHERAL VASCULAR CATHETERIZATION Right 01/25/2016   Procedure: Lower Extremity Angiography;  Surgeon: Jackquelyn Mass, MD;  Location: [facility];  Service: Cardiovascular;  Laterality: Right;   PORTACATH PLACEMENT [name] 03/18/2023   Procedure: INSERTION PORT-A-CATH;  Surgeon: Eldred Grego, MD;  Location: [facility];  Service: General;  Laterality: [name];   TOE AMPUTATION     TONSILLECTOMY      Prior to Admission medications   Medication Sig Start Date End Date Taking? Authorizing Provider  acetaminophen  (TYLENOL ) 325 MG tablet Place 2 tablets (650 mg total) into feeding tube every 6 (six) hours as needed for mild pain (pain score 1-3) (or Fever >/= 101). 03/22/23  Yes Sheril Dines, MD  amiodarone  (PACERONE ) 200 MG tablet Take twice a day through 1/7 and  then once daily Patient taking differently: Take 200 mg by mouth daily. 05/03/23  Yes Wouk, Haynes Lips, MD  apixaban  (ELIQUIS ) 5 MG TABS tablet Place 1 tablet (5 mg total) into feeding tube 2 (two) times daily. 03/22/23  Yes Sheril Dines, MD  ascorbic acid  (VITAMIN C ) 500 MG tablet Place 1 tablet (500 mg total) into feeding tube daily. 03/22/23  Yes Sheril Dines, MD  Cholecalciferol  25 MCG (1000 UT) tablet Place 1 tablet (1,000 Units total) into feeding tube daily. 03/22/23  Yes Sheril Dines, MD  clonazePAM  (KLONOPIN ) 0.5 MG tablet Place 1 tablet (0.5 mg total) into feeding tube 2 (two) times daily. 03/22/23  Yes Sheril Dines, MD  Cyanocobalamin  (VITAMIN B-12) 5000 MCG TBDP Place 10,000 mcg into feeding tube daily. 03/22/23  Yes Sheril Dines, MD  diltiazem  (CARDIZEM  CD) 300 MG 24 hr capsule Take 300 mg by mouth daily.   Yes [provider]  diphenoxylate -atropine  (LOMOTIL ) 2.5-0.025 MG tablet Place 1 tablet into feeding tube 4 (four) times daily. 05/03/23  Yes Wouk, Haynes Lips, MD  ezetimibe  (ZETIA ) 10 MG tablet Place 1 tablet (10 mg total) into feeding tube at bedtime. 03/22/23  Yes Sheril Dines, MD  ferrous sulfate  300 (60 Fe) MG/5ML syrup Place 5 mLs (300 mg total) into feeding tube daily. Patient taking differently: Place 6.8 mLs into feeding tube daily. 03/22/23  Yes Sheril Dines, MD  finasteride  (PROSCAR ) 5 MG tablet Take 1 tablet by mouth daily. 02/25/23  Yes [provider]  furosemide  (LASIX ) 40 MG tablet Take 40 mg by mouth daily.   Yes [provider]  gabapentin  (NEURONTIN ) 300 MG capsule Take 1 capsule by mouth at bedtime. 02/14/23  Yes [provider]  HUMALOG 100 UNIT/ML injection Inject 0-14 Units into the skin 3 (three) times daily with meals. Sliding scale 05/27/23  Yes [provider]  Infant Care Products [facility]) OINT Apply 1 Application topically daily as needed.   Yes [provider]  insulin  glargine  (LANTUS ) 100 UNIT/ML injection Inject 40 Units into the skin 2 (two) times daily. 03/27/23  Yes [provider]  levothyroxine  (SYNTHROID ) 25 MCG tablet Place 1 tablet (25 mcg total) into feeding tube daily at 6 (six) AM. 03/22/23  Yes Sheril Dines, MD  lidocaine -prilocaine  (EMLA ) cream Apply 1 Application topically as needed. 05/28/23  Yes Brahmanday, Govinda R, MD  loperamide  HCl (IMODIUM ) 1 MG/7.5ML suspension Place 15 mLs (2 mg total) into feeding tube every 6 (six) hours as needed for diarrhea or loose stools. 05/03/23  Yes Wouk, Haynes Lips, MD  Multiple Vitamins-Minerals (MULTIVITAMIN WITH MINERALS) tablet Take 1  tablet by mouth daily.   Yes [provider]  nitrofurantoin , macrocrystal-monohydrate, (MACROBID ) 100 MG capsule Take 100 mg by mouth daily.   Yes [provider]  nitroGLYCERIN  (NITROSTAT ) 0.4 MG SL tablet Place 1 tablet (0.4 mg total) under the tongue every 5 (five) minutes as needed for chest pain. 06/20/22  Yes Wenona Hamilton, MD  Nutritional Supplements (FEEDING SUPPLEMENT, NEPRO CARB STEADY,) LIQD Place 237 mLs into feeding tube 6 (six) times daily. 07/08/23  Yes Montey Apa, DO  nystatin  (MYCOSTATIN /NYSTOP ) powder Apply 1 Application topically 2 (two) times daily. Under belly   Yes [provider]  ondansetron  (ZOFRAN ) 4 MG tablet Place 1 tablet (4 mg total) into feeding tube every 8 (eight) hours as needed for nausea. 05/28/23  Yes Brahmanday, Govinda R, MD  polyethylene glycol (MIRALAX  / GLYCOLAX ) 17 g packet Place 17 g into feeding tube daily as needed for moderate constipation. 07/08/23  Yes Darus Engels A, DO  pramipexole  (MIRAPEX ) 1 MG tablet Place 2 tablets (2 mg total) into feeding tube at bedtime. 03/22/23  Yes Sheril Dines, MD  primidone  (MYSOLINE ) 250 MG tablet Place 1 tablet (250 mg total) into feeding tube 2 (two) times daily. 03/22/23  Yes Sheril Dines, MD  tamsulosin  (FLOMAX ) 0.4 MG CAPS capsule Take 0.4 mg by mouth.    Yes [provider]  Vitamins A & D (VITAMIN A & D) ointment Apply 1 Application topically as needed for dry skin. Apply to stumps   Yes [provider]  Water  For Irrigation, Sterile (FREE WATER ) SOLN Place 80 mLs into feeding tube 6 (six) times daily. 07/08/23  Yes Darus Engels A, DO  zinc  sulfate, 50mg  elemental zinc , 220 (50 Zn) MG capsule Take 220 mg by mouth daily.   Yes [provider]  fiber supplement, BANATROL TF, liquid Place 60 mLs into feeding tube 2 (two) times daily. 05/03/23   Wouk, Haynes Lips, MD  isosorbide  mononitrate (IMDUR ) 60 MG 24 hr tablet Take 60 mg by mouth in the morning and at bedtime. Patient not taking: Reported on 08/08/2023    [provider]  losartan  (COZAAR ) 50 MG tablet Take 50 mg by mouth daily. Patient not taking: Reported on 08/08/2023    [provider]    Family History  Problem Relation Age of Onset   Hypertension Father    Coronary artery disease Father    Alcohol  abuse Father    Heart failure Brother      Social History   Tobacco Use   Smoking status: Former    Current packs/day: 0.00    Average packs/day: 0.5 packs/day for 45.0 years (22.5 ttl pk-yrs)    Types: Cigarettes    Start date: 04/04/1970    Quit date: 04/05/2015    Years since quitting: 8.3   Smokeless tobacco: Never  Vaping Use   Vaping status: Every Day  Substance Use Topics   Alcohol  use: Not Currently    Alcohol /week: 3.0 standard drinks of alcohol     Types: 3 Glasses of wine per week   Drug use: No    Allergies as of 08/20/2023 - Review Complete 08/20/2023  Allergen Reaction Noted   Penicillins Other (See Comments) 08/07/2021   Statins  03/19/2019   Metformin  and related Diarrhea 03/15/2022    Review of Systems:    All systems reviewed and negative except where noted in HPI.   Physical Exam:  Vital signs in last 24 hours: Temp:  [97.7 F (36.5 C)-101.1 F (38.4  C)] 97.7 F (36.5 C) (04/23 0853) Pulse Rate:   [94-128] 94 (04/23 0853) Resp:  [4-26] 20 (04/23 0853) BP: (88-123)/(50-67) 92/58 (04/23 0557) SpO2:  [94 %-97 %] 97 % (04/23 0853)   General:   Pleasant, cooperative in NAD Head:  Normocephalic and atraumatic. Eyes:   No icterus.   Conjunctiva pink. PERRLA. Ears:  Normal auditory acuity. Neck:  Supple; no masses or thyroidomegaly Lungs: Respirations even and unlabored. Lungs clear to auscultation bilaterally.   No wheezes, crackles, or rhonchi.  Heart:  Regular rate and rhythm;  Without murmur, clicks, rubs or gallops Abdomen:  Soft, nondistended, nontender. Normal bowel sounds. No appreciable masses or hepatomegaly.  No rebound or guarding.  Rectal:  Not performed. Msk:  Symmetrical without gross deformities.   Extremities: Bilateral amputations of lower extremities Neurologic: Lethargic;  grossly normal neurologically. Skin:  Intact without significant lesions or rashes. Cervical Nodes:  No significant cervical adenopathy.  LAB RESULTS: Recent Labs    08/20/23 2247 08/21/23 0500  WBC 12.8*  --   HGB 7.0* 6.1*  HCT 23.2*  --   PLT 279  --    BMET Recent Labs    08/20/23 2247 08/21/23 0500  NA 138 135  K 3.8 4.3  CL 104 101  CO2 25 27  GLUCOSE 235* 269*  BUN 59* 70*  CREATININE 0.95 1.13  CALCIUM  8.2* 8.7*   LFT Recent Labs    08/20/23 2247  PROT 6.1*  ALBUMIN  2.4*  AST 26  ALT 37  ALKPHOS 113  BILITOT 0.4   PT/INR No results for input(s): "LABPROT", "INR" in the last 72 hours.  STUDIES: DG Chest Port 1 View Result Date: 08/20/2023 CLINICAL DATA:  Shortness of breath EXAM: PORTABLE CHEST 1 VIEW COMPARISON:  07/15/2023 FINDINGS: Right chest wall Port-A-Cath tip in the mid SVC. Stable cardiomediastinal silhouette. Sternotomy and CABG. Ectasias of the ascending aorta. Interstitial and patchy airspace opacities in the left lower lung. No pleural effusion or pneumothorax. IMPRESSION: Interstitial and patchy airspace opacities in the left lower lung may  atelectasis or pneumonia. Electronically Signed   By: Rozell Cornet M.D.   On: 08/20/2023 22:48      Impression / Plan:   Assessment: Principal Problem:   Sepsis due to pneumonia( HCAP vs aspiration) (HCC) Active Problems:   Essential hypertension   PAD (peripheral artery disease) (HCC)   Obesity, Class III, BMI 40-49.9 (morbid obesity) (HCC)   BPH (benign prostatic hyperplasia)   Chronic anticoagulation   Chronic diastolic CHF (congestive heart failure) (HCC)   Paroxysmal atrial fibrillation (HCC)   OSA (obstructive sleep apnea)   Uncontrolled type 2 diabetes mellitus with hyperglycemia, with long-term current use of insulin  (HCC)   Acute respiratory failure with hypoxia (HCC)   Perinephric hematoma   CAD with Hx of CABG   Malignant neoplasm of lower third of esophagus (HCC)   HCAP (healthcare-associated pneumonia)   Acute blood loss anemia   Gastrostomy tube dependent (HCC)   Melena   Torien A Adon Gehlhausen. is a 68 y.o. y/o male with a history of stage IV esophageal cancer with a renal mass that has been deferred any further workup by urology.  The patient came in with profound anemia and black stools.  The patient has been reported that the black stools have slowed down.  The patient has been given Excedrin and it is unclear whether he has bleeding from his esophageal mass or from peptic ulcer disease from his NSAIDs.  Plan:  The  patient will be set up for an EGD after transfusions.  If the patient's esophagus is unpassable then the patient will likely need to be considered for a bleeding scan and possible intervention by vascular surgery for embolization.  If the lesion is able to be passed and there is no peptic ulcer disease but the cancer is the cause of the bleeding then I would recommend reassessing the need for anticoagulation and avoid further NSAID use.  If there is peptic ulcer disease found if we are able to pass the mass then that will be treated during the endoscopy.   The patient's family have been explained the plan and agree with it.  Thank you for involving me in the care of this patient.      LOS: 0 days   Marnee Sink, MD, MD. Sylvan Evener 08/21/2023, 9:05 AM,  Pager [phone] 7am-5pm  Check AMION for 5pm -7am coverage and on weekends   Note: This dictation was prepared with Dragon dictation along with smaller phrase technology. Any transcriptional errors that result from this process are unintentional.

## 2023-08-21 NOTE — Transfer of Care (Signed)
 Immediate Anesthesia Transfer of Care Note  Patient: [name].  Procedure(s) Performed: EGD (ESOPHAGOGASTRODUODENOSCOPY)  Patient Location: Endoscopy Unit  Anesthesia Type:General  Level of Consciousness: drowsy  Airway & Oxygen  Therapy: Patient Spontanous Breathing and Patient connected to face mask oxygen   Post-op Assessment: Report given to RN and Post -op Vital signs reviewed and stable  Post vital signs: Reviewed and stable  Last Vitals:  Vitals Value Taken Time  BP 111/62 1615  Temp 35.8 1615  Pulse 90 1615  Resp 18 1615  SpO2 98% 1615    Last Pain:  Vitals:   08/21/23 1543  TempSrc: Temporal  PainSc: 0-No pain         Complications: No notable events documented.

## 2023-08-21 NOTE — Anesthesia Postprocedure Evaluation (Signed)
 Anesthesia Post Note  Patient: Lizbeth Right.  Procedure(s) Performed: EGD (ESOPHAGOGASTRODUODENOSCOPY)  Patient location during evaluation: Endoscopy Anesthesia Type: General Level of consciousness: awake and alert Pain management: pain level controlled Vital Signs Assessment: post-procedure vital signs reviewed and stable Respiratory status: spontaneous breathing, nonlabored ventilation, respiratory function stable and patient connected to nasal cannula oxygen  Cardiovascular status: blood pressure returned to baseline and stable Postop Assessment: no apparent nausea or vomiting Anesthetic complications: no  No notable events documented.   Last Vitals:  Vitals:   08/21/23 1616 08/21/23 1626  BP: (!) 111/59 111/63  Pulse: 90 91  Resp: (!) 22   Temp: (!) 35.9 C   SpO2: 95% 99%    Last Pain:  Vitals:   08/21/23 1626  TempSrc:   PainSc: 0-No pain                 Enrique Harvest

## 2023-08-22 ENCOUNTER — Encounter: Payer: Self-pay | Admitting: Gastroenterology

## 2023-08-22 DIAGNOSIS — K922 Gastrointestinal hemorrhage, unspecified: Secondary | ICD-10-CM

## 2023-08-22 DIAGNOSIS — J189 Pneumonia, unspecified organism: Secondary | ICD-10-CM | POA: Diagnosis not present

## 2023-08-22 DIAGNOSIS — Z515 Encounter for palliative care: Secondary | ICD-10-CM

## 2023-08-22 LAB — CBC
HCT: 24.8 % — ABNORMAL LOW (ref 39.0–52.0)
Hemoglobin: 8.2 g/dL — ABNORMAL LOW (ref 13.0–17.0)
MCH: 29.5 pg (ref 26.0–34.0)
MCHC: 33.1 g/dL (ref 30.0–36.0)
MCV: 89.2 fL (ref 80.0–100.0)
Platelets: 278 10*3/uL (ref 150–400)
RBC: 2.78 MIL/uL — ABNORMAL LOW (ref 4.22–5.81)
RDW: 16.9 % — ABNORMAL HIGH (ref 11.5–15.5)
WBC: 16 10*3/uL — ABNORMAL HIGH (ref 4.0–10.5)
nRBC: 0 % (ref 0.0–0.2)

## 2023-08-22 LAB — GLUCOSE, CAPILLARY
Glucose-Capillary: 179 mg/dL — ABNORMAL HIGH (ref 70–99)
Glucose-Capillary: 198 mg/dL — ABNORMAL HIGH (ref 70–99)

## 2023-08-22 MED ORDER — PANTOPRAZOLE SODIUM 40 MG PO TBEC
40.0000 mg | DELAYED_RELEASE_TABLET | Freq: Two times a day (BID) | ORAL | Status: DC
  Administered 2023-08-22 – 2023-08-23 (×2): 40 mg via ORAL
  Filled 2023-08-22 (×2): qty 1

## 2023-08-22 MED ORDER — ONDANSETRON HCL 4 MG/2ML IJ SOLN: 4.0000 mg | Freq: Four times a day (QID) | INTRAMUSCULAR | Status: DC | PRN

## 2023-08-22 MED ORDER — ACETAMINOPHEN 650 MG RE SUPP: 650.0000 mg | Freq: Four times a day (QID) | RECTAL | Status: DC | PRN

## 2023-08-22 MED ORDER — POLYVINYL ALCOHOL 1.4 % OP SOLN: 1.0000 [drp] | Freq: Four times a day (QID) | OPHTHALMIC | Status: DC | PRN

## 2023-08-22 MED ORDER — MORPHINE SULFATE (CONCENTRATE) 10 MG /0.5 ML PO SOLN: 5.0000 mg | ORAL | Status: DC | PRN

## 2023-08-22 MED ORDER — GLYCOPYRROLATE 1 MG PO TABS
1.0000 mg | ORAL_TABLET | ORAL | Status: DC | PRN
Start: 2023-08-22 — End: 2023-08-23

## 2023-08-22 MED ORDER — HALOPERIDOL 0.5 MG PO TABS: 0.5000 mg | ORAL_TABLET | ORAL | Status: DC | PRN

## 2023-08-22 MED ORDER — ACETAMINOPHEN 325 MG PO TABS
650.0000 mg | ORAL_TABLET | Freq: Four times a day (QID) | ORAL | Status: DC | PRN
Start: 2023-08-22 — End: 2023-08-23

## 2023-08-22 MED ORDER — GLYCOPYRROLATE 0.2 MG/ML IJ SOLN: 0.2000 mg | INTRAMUSCULAR | Status: DC | PRN

## 2023-08-22 MED ORDER — HALOPERIDOL LACTATE 2 MG/ML PO CONC: 0.5000 mg | ORAL | Status: DC | PRN

## 2023-08-22 MED ORDER — BIOTENE DRY MOUTH MT LIQD: 15.0000 mL | OROMUCOSAL | Status: DC | PRN

## 2023-08-22 MED ORDER — AMIODARONE HCL 200 MG PO TABS
200.0000 mg | ORAL_TABLET | Freq: Every day | ORAL | Status: DC
  Administered 2023-08-23: 200 mg
  Filled 2023-08-22: qty 1

## 2023-08-22 MED ORDER — LORAZEPAM 2 MG/ML IJ SOLN: 1.0000 mg | INTRAMUSCULAR | Status: DC | PRN

## 2023-08-22 MED ORDER — GABAPENTIN 250 MG/5ML PO SOLN
300.0000 mg | Freq: Every day | ORAL | Status: DC
  Administered 2023-08-23: 300 mg
  Filled 2023-08-22 (×3): qty 6

## 2023-08-22 MED ORDER — LORAZEPAM 2 MG/ML PO CONC: 1.0000 mg | ORAL | Status: DC | PRN

## 2023-08-22 MED ORDER — ONDANSETRON HCL 4 MG PO TABS: 4.0000 mg | ORAL_TABLET | Freq: Four times a day (QID) | ORAL | Status: DC | PRN

## 2023-08-22 MED ORDER — DIPHENHYDRAMINE HCL 50 MG/ML IJ SOLN
12.5000 mg | INTRAMUSCULAR | Status: DC | PRN
  Administered 2023-08-22: 12.5 mg via INTRAVENOUS
  Filled 2023-08-22: qty 1

## 2023-08-22 MED ORDER — HALOPERIDOL LACTATE 5 MG/ML IJ SOLN
0.5000 mg | INTRAMUSCULAR | Status: DC | PRN
  Administered 2023-08-22: 0.5 mg via INTRAVENOUS
  Filled 2023-08-22: qty 1

## 2023-08-22 MED ORDER — LORAZEPAM 1 MG PO TABS
1.0000 mg | ORAL_TABLET | ORAL | Status: DC | PRN
  Administered 2023-08-22: 1 mg via ORAL
  Filled 2023-08-22: qty 1

## 2023-08-22 NOTE — Consult Note (Signed)
 Palliative Medicine [facility] at [facility] Telephone:([phone] Fax:([phone]   Name: [name]. Date: 08/22/2023 MRN: [id number]  DOB: 1956/04/22  Patient Care Team: Yehuda Helms, MD as PCP - General (Internal Medicine) Wenona Hamilton, MD as PCP - Cardiology (Cardiology) Ardeen Kohler, MD as PCP - Electrophysiology (Cardiology) Gwyn Leos, MD as Consulting Physician (Oncology)    REASON FOR CONSULTATION: [name]. is a 68 y.o. male with multiple medical problems including COPD, A-fib, history of CHF, PVD status post bilateral BKAs, and esophageal cancer status post PEG and XRT.  Patient with several recent hospitalizations, most recently 06/29/2023 to 07/08/2023 with respiratory failure from RSV and/for aspiration pneumonia requiring intubation.  Patient now readmitted 08/22/23 with black tarry stools and HCAP.  EGD showed complete esophageal occlusion.  Palliative care consulted to address goals.  SOCIAL HISTORY:     reports that he quit smoking about 8 years ago. His smoking use included cigarettes. He started smoking about 53 years ago. He has a 22.5 pack-year smoking history. He has never used smokeless tobacco. He reports that he does not currently use alcohol  after a past usage of about 3.0 standard drinks of alcohol  per week. He reports that he does not use drugs.  Patient is married lives at home with his wife.  He had 2 children, both of whom are now deceased.  Patient worked as a Psychologist, occupational.  ADVANCE DIRECTIVES:  Not on file  CODE STATUS: DNR  PAST MEDICAL HISTORY: Past Medical History:  Diagnosis Date   Arrhythmia    atrial fibrillation   Asthma    CHF (congestive heart failure) (HCC)    COPD (chronic obstructive pulmonary disease) (HCC)    Coronary artery disease    Depression    Diabetes mellitus without complication (HCC)    Gout    History anabolic steroid use    Hyperlipidemia     Hypertension    Hypogonadism in male    MI (myocardial infarction) (HCC)    Morbid obesity (HCC)    Myocardial infarction (HCC)    Peripheral vascular disease (HCC)    Perirectal abscess    Pleurisy    Sleep apnea    CPAP at night, no oxygen    Varicella     PAST SURGICAL HISTORY:  Past Surgical History:  Procedure Laterality Date   ABDOMINAL AORTIC ANEURYSM REPAIR     ACHILLES TENDON SURGERY Left 01/10/2021   Procedure: ACHILLES LENGTHENING/KIDNER;  Surgeon: Pink Bridges, DPM;  Location: [facility] ORS;  Service: Podiatry;  Laterality: Left;   AMPUTATION Left 10/03/2021   Procedure: AMPUTATION BELOW KNEE;  Surgeon: Jackquelyn Mass, MD;  Location: [facility];  Service: Vascular;  Laterality: Left;   AMPUTATION Wood 05/24/2022   Procedure: AMPUTATION BELOW KNEE;  Surgeon: Celso College, MD;  Location: [facility];  Service: General;  Laterality: Wood;   AMPUTATION TOE Wood 02/10/2016   Procedure: AMPUTATION TOE 3RD TOE;  Surgeon: Anell Baptist, DPM;  Location: [facility];  Service: Podiatry;  Laterality: Wood;   AMPUTATION TOE Left 02/24/2020   Procedure: AMPUTATION TOE;  Surgeon: Pink Bridges, DPM;  Location: [facility];  Service: Podiatry;  Laterality: Left;   APPLICATION OF WOUND VAC Left 02/29/2020   Procedure: APPLICATION OF WOUND VAC;  Surgeon: Pink Bridges, DPM;  Location: [facility];  Service: Podiatry;  Laterality: Left;   BIOPSY  03/15/2023   Procedure: BIOPSY;  Surgeon: Luke Salaam, MD;  Location: [facility]OSCOPY;  Service: Gastroenterology;;   BIOPSY  04/10/2023   Procedure: BIOPSY;  Surgeon: Corky Diener, Alphonsus Jeans, MD;  Location: [facility]OSCOPY;  Service: Gastroenterology;;   CARDIAC CATHETERIZATION     CARDIOVERSION [name] 02/13/2022   Procedure: CARDIOVERSION;  Surgeon: Michelle Aid, MD;  Location: [facility];  Service: Cardiovascular;  Laterality: [name];   CARDIOVERSION [name] 04/23/2023   Procedure: CARDIOVERSION;  Surgeon: Devorah Fonder, MD;  Location: [facility];  Service:  Cardiovascular;  Laterality: [name];   COLONOSCOPY WITH PROPOFOL  [name] 11/18/2015   Procedure: COLONOSCOPY WITH PROPOFOL ;  Surgeon: Cassie Click, MD;  Location: [facility] ENDOSCOPY;  Service: Endoscopy;  Laterality: [name];   CORONARY ARTERY BYPASS GRAFT     CORONARY STENT INTERVENTION [name] 02/02/2020   Procedure: CORONARY STENT INTERVENTION;  Surgeon: Percival Brace, MD;  Location: [facility] INVASIVE CV LAB;  Service: Cardiovascular;  Laterality: [name];   ESOPHAGOGASTRODUODENOSCOPY [name] 08/21/2023   Procedure: EGD (ESOPHAGOGASTRODUODENOSCOPY);  Surgeon: Marnee Sink, MD;  Location: [facility] ENDOSCOPY;  Service: Endoscopy;  Laterality: [name];   ESOPHAGOGASTRODUODENOSCOPY (EGD) WITH PROPOFOL  [name] 03/15/2023   Procedure: ESOPHAGOGASTRODUODENOSCOPY (EGD) WITH PROPOFOL ;  Surgeon: Luke Salaam, MD;  Location: [facility] ENDOSCOPY;  Service: Gastroenterology;  Laterality: [name];   FLEXIBLE SIGMOIDOSCOPY [name] 04/10/2023   Procedure: FLEXIBLE SIGMOIDOSCOPY;  Surgeon: Toledo, Alphonsus Jeans, MD;  Location: [facility] ENDOSCOPY;  Service: Gastroenterology;  Laterality: [name];   GASTROSTOMY [name] 03/18/2023   Procedure: INSERTION OF GASTROSTOMY TUBE;  Surgeon: Eldred Grego, MD;  Location: [facility];  Service: General;  Laterality: [name];   IMPACTION REMOVAL  03/15/2023   Procedure: IMPACTION REMOVAL;  Surgeon: Luke Salaam, MD;  Location: [facility] ENDOSCOPY;  Service: Gastroenterology;;   INCISION AND DRAINAGE Left 08/07/2021   Procedure: INCISION AND DRAINAGE-Partial Calcanectomy;  Surgeon: Pink Bridges, DPM;  Location: [facility];  Service: Podiatry;  Laterality: Left;   IRRIGATION AND DEBRIDEMENT FOOT Left 02/29/2020   Procedure: IRRIGATION AND DEBRIDEMENT FOOT;  Surgeon: Pink Bridges, DPM;  Location: [facility];  Service: Podiatry;  Laterality: Left;   IRRIGATION AND DEBRIDEMENT FOOT Left 02/24/2020   Procedure: IRRIGATION AND DEBRIDEMENT FOOT;  Surgeon: Pink Bridges, DPM;  Location: [facility];  Service: Podiatry;  Laterality: Left;   KNEE  ARTHROSCOPY     LEFT HEART CATH AND CORS/GRAFTS ANGIOGRAPHY [name] 02/02/2020   Procedure: LEFT HEART CATH AND CORS/GRAFTS ANGIOGRAPHY;  Surgeon: Ronney Cola, MD;  Location: [facility] CV [facility];  Service: Cardiovascular;  Laterality: [name];   LOWER EXTREMITY ANGIOGRAPHY Left 02/25/2020   Procedure: Lower Extremity Angiography;  Surgeon: Celso College, MD;  Location: [facility];  Service: Cardiovascular;  Laterality: Left;   LOWER EXTREMITY ANGIOGRAPHY Left 01/04/2021   Procedure: LOWER EXTREMITY ANGIOGRAPHY;  Surgeon: Celso College, MD;  Location: [facility];  Service: Cardiovascular;  Laterality: Left;   LOWER EXTREMITY ANGIOGRAPHY Wood 05/21/2022   Procedure: Lower Extremity Angiography;  Surgeon: Celso College, MD;  Location: [facility];  Service: Cardiovascular;  Laterality: Wood;   METATARSAL HEAD EXCISION Left 01/10/2021   Procedure: METATARSAL HEAD EXCISION - LEFT 5th;  Surgeon: Pink Bridges, DPM;  Location: [facility];  Service: Podiatry;  Laterality: Left;   PERIPHERAL VASCULAR CATHETERIZATION Wood 01/24/2016   Procedure: Lower Extremity Angiography;  Surgeon: Jackquelyn Mass, MD;  Location: [facility];  Service: Cardiovascular;  Laterality: Wood;   PERIPHERAL VASCULAR CATHETERIZATION Wood 01/25/2016   Procedure: Lower Extremity Angiography;  Surgeon: Jackquelyn Mass, MD;  Location: [facility];  Service: Cardiovascular;  Laterality: Wood;  PORTACATH PLACEMENT [name] 03/18/2023   Procedure: INSERTION PORT-A-CATH;  Surgeon: Eldred Grego, MD;  Location: [facility];  Service: General;  Laterality: [name];   TOE AMPUTATION     TONSILLECTOMY      HEMATOLOGY/ONCOLOGY HISTORY:  Oncology History Overview Note  IMPRESSION: 1. Distal esophageal cancer with extension into the proximal stomach and metastatic high left paratracheal and gastrohepatic ligament lymph nodes. 2. Chronic Wood perinephric hematoma with associated  peripheral hypermetabolism. 3. Sludge and or gallstones. 4.  Aortic atherosclerosis (ICD10-I70.0). 5. Enlarged pulmonic trunk, indicative of pulmonary arterial hypertension.     Electronically Signed   By: Shearon Denis M.D.   On: 05/22/2023 15:15  # JAN 28th, 2025- Start single agent radiation no chemotherapy.   # COPD CHF CAD; chronic UTI; BKA ; diabetes   Esophageal adenocarcinoma (HCC)  03/15/2023 Initial Diagnosis   Esophageal adenocarcinoma (HCC)   04/25/2023 - 04/25/2023 Chemotherapy   Patient is on Treatment Plan : ESOPHAGUS Carboplatin + Paclitaxel Weekly X 6 Weeks with XRT     Malignant neoplasm of lower third of esophagus (HCC)  04/11/2023 Initial Diagnosis   Malignant neoplasm of lower third of esophagus (HCC)   05/28/2023 Cancer Staging   Staging form: Esophagus - Adenocarcinoma, AJCC 8th Edition - Clinical: Stage IVB (cT4a, cN1, pM1) - Signed by Gwyn Leos, MD on 05/28/2023     ALLERGIES:  is allergic to penicillins, statins, and metformin  and related.  MEDICATIONS:  Current Facility-Administered Medications  Medication Dose Route Frequency Provider Last Rate Last Admin   acetaminophen  (TYLENOL ) tablet 650 mg  650 mg Per Tube Q6H PRN Lorita Rosa, MD       Or   acetaminophen  (TYLENOL ) suppository 650 mg  650 mg Rectal Q6H PRN Lorita Rosa, MD       albuterol  (PROVENTIL ) (2.5 MG/3ML) 0.083% nebulizer solution 2.5 mg  2.5 mg Nebulization Q2H PRN Marnee Sink, MD       Cecily Cohen ON 08/23/2023] amiodarone  (PACERONE ) tablet 200 mg  200 mg Per Tube Daily Lorita Rosa, MD       antiseptic oral rinse (BIOTENE) solution 15 mL  15 mL Topical PRN Lorita Rosa, MD       clonazePAM  (KLONOPIN ) tablet 0.5 mg  0.5 mg Per Tube BID Marnee Sink, MD   0.5 mg at 08/22/23 1610   diphenhydrAMINE  (BENADRYL ) injection 12.5 mg  12.5 mg Intravenous Q4H PRN Lorita Rosa, MD       finasteride  (PROSCAR ) tablet 5 mg  5 mg Oral Daily Marnee Sink, MD   5 mg at  08/22/23 9604   gabapentin  (NEURONTIN ) 250 MG/5ML solution 300 mg  300 mg Per Tube QHS Yates, Jennifer, MD       glycopyrrolate  (ROBINUL ) tablet 1 mg  1 mg Oral Q4H PRN Lorita Rosa, MD       Or   glycopyrrolate  (ROBINUL ) injection 0.2 mg  0.2 mg Subcutaneous Q4H PRN Lorita Rosa, MD       Or   glycopyrrolate  (ROBINUL ) injection 0.2 mg  0.2 mg Intravenous Q4H PRN Lorita Rosa, MD       haloperidol  (HALDOL ) tablet 0.5 mg  0.5 mg Oral Q4H PRN Lorita Rosa, MD       Or   haloperidol  (HALDOL ) 2 MG/ML solution 0.5 mg  0.5 mg Sublingual Q4H PRN Lorita Rosa, MD       Or   haloperidol  lactate (HALDOL ) injection 0.5 mg  0.5 mg Intravenous Q4H PRN Lorita Rosa, MD       levothyroxine  (  SYNTHROID ) tablet 25 mcg  25 mcg Per Tube Q0600 Marnee Sink, MD   25 mcg at 08/22/23 0537   LORazepam  (ATIVAN ) tablet 1 mg  1 mg Oral Q4H PRN Lorita Rosa, MD       Or   LORazepam  (ATIVAN ) 2 MG/ML concentrated solution 1 mg  1 mg Sublingual Q4H PRN Lorita Rosa, MD       Or   LORazepam  (ATIVAN ) injection 1 mg  1 mg Intravenous Q4H PRN Lorita Rosa, MD       morphine  CONCENTRATE 10 mg / 0.5 ml oral solution 5 mg  5 mg Oral Q2H PRN Lorita Rosa, MD       Or   morphine  CONCENTRATE 10 mg / 0.5 ml oral solution 5 mg  5 mg Sublingual Q2H PRN Lorita Rosa, MD       nitroGLYCERIN  (NITROSTAT ) SL tablet 0.4 mg  0.4 mg Sublingual Q5 min PRN Marnee Sink, MD   0.4 mg at 08/21/23 1610   ondansetron  (ZOFRAN ) tablet 4 mg  4 mg Per Tube Q6H PRN Lorita Rosa, MD       Or   ondansetron  (ZOFRAN ) injection 4 mg  4 mg Intravenous Q6H PRN Lorita Rosa, MD       Oral care mouth rinse  15 mL Mouth Rinse PRN Lorita Rosa, MD       pantoprazole  (PROTONIX ) EC tablet 40 mg  40 mg Oral BID Lorita Rosa, MD       polyvinyl alcohol  (LIQUIFILM TEARS) 1.4 % ophthalmic solution 1 drop  1 drop Both Eyes QID PRN Lorita Rosa, MD       pramipexole  (MIRAPEX ) tablet 2 mg  2 mg Per Tube QHS Marnee Sink, MD    2 mg at 08/21/23 2118   primidone  (MYSOLINE ) tablet 250 mg  250 mg Per Tube BID Marnee Sink, MD   250 mg at 08/22/23 0930   tamsulosin  (FLOMAX ) capsule 0.4 mg  0.4 mg Oral Daily Marnee Sink, MD   0.4 mg at 08/22/23 9604    VITAL SIGNS: BP 112/75 (BP Location: Wood Arm)   Pulse (!) 109   Temp 98 F (36.7 C)   Resp 18   Ht 6\' 4"  (1.93 m)   Wt 251 lb 5.2 oz (114 kg)   SpO2 94%   BMI 30.59 kg/m  Filed Weights   08/21/23 2241 08/22/23 0500  Weight: 252 lb 13.9 oz (114.7 kg) 251 lb 5.2 oz (114 kg)    Estimated body mass index is 30.59 kg/m as calculated from the following:   Height as of this encounter: 6\' 4"  (1.93 m).   Weight as of this encounter: 251 lb 5.2 oz (114 kg).  LABS: CBC:    Component Value Date/Time   WBC 16.0 (H) 08/22/2023 1134   HGB 8.2 (L) 08/22/2023 1134   HGB 10.1 (L) 07/22/2023 1249   HGB 10.7 (L) 05/15/2022 1440   HCT 24.8 (L) 08/22/2023 1134   HCT 33.4 (L) 05/15/2022 1440   PLT 278 08/22/2023 1134   PLT 258 07/22/2023 1249   PLT 394 05/15/2022 1440   MCV 89.2 08/22/2023 1134   MCV 89 05/15/2022 1440   MCV 96 07/12/2011 0919   NEUTROABS 11.0 (H) 08/20/2023 2247   NEUTROABS 7.5 (H) 07/12/2011 0919   LYMPHSABS 0.5 (L) 08/20/2023 2247   LYMPHSABS 1.2 07/12/2011 0919   MONOABS 0.8 08/20/2023 2247   MONOABS 0.8 (H) 07/12/2011 0919   EOSABS 0.4 08/20/2023 2247   EOSABS 0.4 07/12/2011 0919  BASOSABS 0.1 08/20/2023 2247   BASOSABS 0.1 07/12/2011 0919   Comprehensive Metabolic Panel:    Component Value Date/Time   NA 135 08/21/2023 0500   NA 141 07/12/2011 0919   K 4.3 08/21/2023 0500   K 4.4 07/12/2011 0919   CL 101 08/21/2023 0500   CL 102 07/12/2011 0919   CO2 27 08/21/2023 0500   CO2 29 07/12/2011 0919   BUN 70 (H) 08/21/2023 0500   BUN 16 05/05/2013 1022   CREATININE 1.13 08/21/2023 0500   CREATININE 0.93 07/22/2023 1249   CREATININE 0.71 05/05/2013 1022   GLUCOSE 269 (H) 08/21/2023 0500   GLUCOSE 76 07/12/2011 0919   CALCIUM  8.7  (L) 08/21/2023 0500   CALCIUM  8.8 07/12/2011 0919   AST 26 08/20/2023 2247   AST 18 07/22/2023 1249   ALT 37 08/20/2023 2247   ALT 19 07/22/2023 1249   ALKPHOS 113 08/20/2023 2247   BILITOT 0.4 08/20/2023 2247   BILITOT 0.3 07/22/2023 1249   PROT 6.1 (L) 08/20/2023 2247   ALBUMIN  2.4 (L) 08/20/2023 2247    RADIOGRAPHIC STUDIES: DG Chest Port 1 View Result Date: 08/20/2023 CLINICAL DATA:  Shortness of breath EXAM: PORTABLE CHEST 1 VIEW COMPARISON:  07/15/2023 FINDINGS: Wood chest wall Port-A-Cath tip in the mid SVC. Stable cardiomediastinal silhouette. Sternotomy and CABG. Ectasias of the ascending aorta. Interstitial and patchy airspace opacities in the left lower lung. No pleural effusion or pneumothorax. IMPRESSION: Interstitial and patchy airspace opacities in the left lower lung may atelectasis or pneumonia. Electronically Signed   By: Rozell Cornet M.D.   On: 08/20/2023 22:48   CT Abdomen Pelvis W Contrast Result Date: 08/19/2023 CLINICAL DATA:  Follow-up Wood renal parenchymal laceration or retroperitoneal hematoma EXAM: CT ABDOMEN AND PELVIS WITH CONTRAST TECHNIQUE: Multidetector CT imaging of the abdomen and pelvis was performed using the standard protocol following bolus administration of intravenous contrast. RADIATION DOSE REDUCTION: This exam was performed according to the departmental dose-optimization program which includes automated exposure control, adjustment of the mA and/or kV according to patient size and/or use of iterative reconstruction technique. CONTRAST:  OMNIPAQUE  IOHEXOL  300 MG/ML  SOLN COMPARISON:  June 14, 2023 FINDINGS: Lower chest: Small bilateral pleural reactions and minute Wood pleural effusion with minimal basilar hypoventilatory atelectasis Hepatobiliary: No focal liver abnormality is seen. No gallstones, gallbladder wall thickening, or biliary dilatation. Pancreas: Unremarkable. No pancreatic ductal dilatation or surrounding inflammatory changes.  Spleen: Normal in size without focal abnormality. No change in the capsular calcification of the spleen about 1 cm in maximum diameter. Adrenals/Urinary Tract: Comparison with prior examination accounting for differences in techniques again noted is the presence of a complex bilobed fluid collection in the Wood posterior retroperitoneal soft tissues extending into the adjacent iliopsoas muscle and Wood lumbar muscle. Measuring 10 cm in maximum transverse diameter, 3 point 7 cm in AP diameter and 10 cm in longitudinal diameter. Overall appears unchanged since prior examination. Slightly displaced in the lower pole of the Wood kidney anteriorly. Collection appears better defined more hypodense and could correlate with resolving hematoma. Small amount of fluid of the hepatorenal fossa with minimal stranding similar but decreased since prior examination. Left kidney is unremarkable. The left adrenal gland remains enlarged similar to prior examination better defined on the coronal images measuring about 1.7 x 2 cm in diameter. Follow-up recommended. Stomach/Bowel: Stomach is within normal limits. Appendix appears normal. No evidence of bowel wall thickening, distention, or inflammatory changes. Abundant residual fecal material throughout the left side of  the colon and rectosigmoid colon with mild fecal impaction in the rectum Gastrostomy tube in the stomach in good position Vascular/Lymphatic: Aortic atherosclerosis. No enlarged abdominal or pelvic lymph nodes. Reproductive: Prostate is unremarkable. Other: No abdominal wall hernia or abnormality. No abdominopelvic ascites. Musculoskeletal: No acute or significant osseous findings. IMPRESSION: *No change in the size of the complex bilobed fluid collection in the Wood posterior retroperitoneal soft tissues extending into the adjacent iliopsoas muscle and Wood lumbar muscle. Collection appears better defined more hypodense and could correlate with resolving hematoma.  *Small amount of fluid in the hepatorenal fossa with minimal stranding similar but decreased since prior examination. *Left adrenal gland remains enlarged similar to prior examination better defined on the coronal images measuring about 1.7 x 2 cm in diameter. Follow-up recommended. *Abundant residual fecal material throughout the left side of the colon and rectosigmoid colon with mild fecal impaction in the rectum. *Small bilateral pleural reactions and minute Wood pleural effusion with minimal basilar hypoventilatory atelectasis. *Aortic atherosclerosis. Electronically Signed   By: Fredrich Jefferson M.D.   On: 08/19/2023 19:20    PERFORMANCE STATUS (ECOG) : 3 - Symptomatic, >50% confined to bed  Review of Systems Unless otherwise noted, a complete review of systems is negative.  Physical Exam General: NAD Cardiovascular: Irregular Pulmonary: Unlabored Extremities: Bilateral BKA's Skin: no rashes Neurological: Weakness but otherwise nonfocal  IMPRESSION: Patient with multiple recent hospitalizations, now admitted with presumed GI bleed and HCAP.  He underwent EGD, which showed complete esophageal occlusion.  Has stage IV adenocarcinoma of the esophagus.  Status post PEG placement and XRT.   Together with Dr. Murrel Arnt, we met with patient, wife, and sister-in-law.  Patient has had intermittent confusion but was able to participate meaningfully in a conversation regarding goals this afternoon.  Both patient and wife verbalized understanding that patient is not felt to be a viable candidate for additional cancer treatment.  In light of his multiple comorbidities, active bleeding, and advanced cancer, his prognosis is poor and patient is likely nearing end-of-life.  Patient and wife are in agreement with proceeding with best supportive care with hospice involvement at home.  Patient states that he would not wish to be resuscitated and agrees with DNR/DNI.  Wife also verbalized understanding and  agreement.  Wife wishes to take patient home with hospice involvement.  We did discuss possible future option of transitioning to IPU.  Will have hospice liaison coordinate.  PLAN: - Best supportive care - Home with hospice - Chaplain consult - DNR  Case and plan discussed with Dr. Valentine Gasmen and Dr. Murrel Arnt  Time Total: 70 minutes  Visit consisted of counseling and education dealing with the complex and emotionally intense issues of symptom management and palliative care in the setting of serious and potentially life-threatening illness.Greater than 50%  of this time was spent counseling and coordinating care related to the above assessment and plan.  Signed by: Gerilyn Kobus, PhD, NP-C

## 2023-08-22 NOTE — Progress Notes (Signed)
 Progress Note   Patient: [name]. EAV:[id number] DOB: [birth year] DOA: 08/20/2023     1 DOS: the patient was seen and examined on 08/22/2023   Brief hospital course: 68yo with h/o CAD, afib, chronic HFpEF, esophageal CA s/p chemo/rads with G-tube dependence, PAD s/p B AKA, DM, renal mass with chronic hematuria (last seen by urology 4/16), OSA, and morbid obesity who presented on 4/22 with SOB.  He was last hospitalized from 3/1-10 with respiratory failure from RSV and/or aspiration PNA requiring intubation but was weaned off O2 prior to dc.  Sat 69% on RA.  Reported 4 days of black stools, Hgb 7 (10 a month ago).  CXR with interstitial and patchy infiltrates in LLL, started on Cefepime  and Vanc for HCAP.  Transfused 1 unit PRBC overnight on 4/23 and then 2 more on 4/24.  EGD with complete esophageal occlusion, unable to pass scope.  Oncology consulted and agrees with hospice/DNR.  Palliative care consulting.    Assessment and Plan:  Acute blood loss anemia Melena, with history of esophageal cancer/UGI bleed Hemoglobin 7 and became hypotensive following admission Transfused 1 unit PRBC and then 2 more for Hgb goal >10 GI consulted EGD performed - completely obstructing, malignant esophageal tumor in the lower third of the esophagus IV Protonix  BID No NSAIDs indefinitely Hold Eliquis  - uncertain if this is related but it certainly won't help if he has a bleed If significant ongoing bleeding, can try a CTA with vascular surgery embolization   Possible sepsis with acute hypoxic respiratory failure Recent admission with PNA requiring mechanical ventilation Sepsis criteria include fever to 101.1, tachycardia, leukocytosis, hypotension, hypoxia-O2 sat 88% on room air with EMS Given known malignancy and active GI bleed, infection is uncertain; hypoxia may be related to anemia CXR with LLL opacities but patient has no respiratory/URI symptoms (just the hypoxia as above) Patient does  have history of Klebsiella UTI 03/2023 and C. difficile 03/2023 but UA with 30 protein, large LE, otherwise WNL so not obviously infected Cefepime  and vancomycin  ordered to treat but pharmacy was concerned about vanc dosing in the setting of B AKA and given ? Of infection vs. Bleed and cancer-associated fever, will hold Vanc for now Continue empiric Cefepime  Supplemental oxygen  to keep sats over 92% Patient is acutely ill with multiple chronic comorbidities and at high risk of clinical decompensation especially in view of recent need for mechanical ventilation   Perinephric hematoma Chronic perinephric hematoma with renal mass Hemoglobin 7, down from 10 months prior CT abdomen from 08/08/23 ordered by urology showed: "No change in the size of the complex bilobed fluid collection in the Wood posterior retroperitoneal soft tissues extending into the adjacent iliopsoas muscle and Wood lumbar muscle. Collection appears better defined more hypodense and could correlate with resolving hematoma" Hold systemic anticoagulation for now pending stability of hemoglobin "Given his terminal cancer diagnosis and comorbidities, conservative management is recommended"   Malignant neoplasm of lower third of esophagus Stage [id number] stage 4 distal esophageal cancer, now completely obstructing esophagus despite recent completion of radiation, unresectable Considered to be a poor candidate for systemic therapy but may reconsider after PET scan in 2-3 months G-tube dependent - hold tube feeds during active GI bleed His prognosis is very poor Despite this, he remains full code Realistically, he appears to be a hospice candidate Dr. Valentine Gasmen and Jerrilyn Moras Borders consulting - patient is recommended for hospice Patient is aware, agrees with DNR Will discuss further GOC with patient, wife, sister-in-law soon  Uncontrolled type 2 diabetes mellitus with hyperglycemia, with long-term current use of insulin  Last A1c was  7.8, suboptimal control Stop basal insulin  Sliding scale insulin  coverage for now (patient is concerned that his wife will not give herself insulin  if she is not also giving it to him)   Paroxysmal atrial fibrillation  Holding anticoagulation due to suspected acute blood loss Continue amiodarone  for rate control Hold diltiazem  due to soft blood pressures (although cardiology notes indicate that he is on metoprolol  rather than diltiazem )   Essential hypertension Currently hypotensive Holding home antihypertensives of diltiazem , furosemide  due to soft blood pressure related to sepsis   CAD/PAD S/p CABG x 2 2006, PCI with DES 2021 s/p bilateral AKA Holding Eliquis , due to anemia  Hold ezetimibe  Supportive care for transfers and fall precautions He has been unable to transfer himself since 03/2023 Continue Klonopin , Primidone , Mirapex    OSA (obstructive sleep apnea) CPAP nightly   Obesity, Class III, BMI 40-49.9 (morbid obesity)  Complicating factor to overall prognosis and care   Chronic diastolic CHF (congestive heart failure) (HCC) Clinically euvolemic but close monitoring for fluid overload in view of sepsis fluids Holding Lasix , isosorbide , losartan  because of low blood pressures   BPH (benign prostatic hyperplasia) History of Klebsiella UTI 03/2023 UTI prophylaxis with nitrofurantoin  (can resume once he is no longer on Cefepime ) Continue finasteride  and tamsulosin  Last seen by urologist 08/14/2023   Hypothyroidism Continue Synthroid          Consultants: GI Oncology Palliative care   Procedures: EGD 4/23   Antibiotics: Cefepime  4/22- Vancomycin  4/22-23  30 Day Unplanned Readmission Risk Score    Flowsheet Row ED to Hosp-Admission (Current) from 08/20/2023 in [facility]  30 Day Unplanned Readmission Risk Score (%) 55.25 Filed at 08/22/2023 0801       This score is the patient's risk of an unplanned readmission within 30 days of being  discharged (0 -100%). The score is based on dignosis, age, lab data, medications, orders, and past utilization.   Low:  0-14.9   Medium: 15-21.9   High: 22-29.9   Extreme: 30 and above           Subjective: The patient was sitting up in bed.  When asked what he was told, he said 3 doctors have now told him that he will be "in the funeral home next week."  He wants DNR and wants to go home with hospice tomorrow.   Objective: Vitals:   08/22/23 0757 08/22/23 0827  BP: 97/60   Pulse: (!) 112   Resp:    Temp: 98.2 F (36.8 C)   SpO2: 93% 97%    Intake/Output Summary (Last 24 hours) at 08/22/2023 0859 Last data filed at 08/22/2023 0303 Gross per 24 hour  Intake 1331.87 ml  Output --  Net 1331.87 ml   Filed Weights   08/21/23 2241 08/22/23 0500  Weight: 114.7 kg 114 kg    Exam:  General:  Appears chronically ill, pale; sitting up in bed and requesting to move to chair Eyes:   EOMI, normal lids, iris, pale conjunctivae ENT:  grossly normal hearing, lips & tongue, mmm Cardiovascular:  RRR.  Respiratory:   CTA bilaterally with no wheezes/rales/rhonchi.  Normal respiratory effort. Abdomen:  soft, NT, ND; G tube in place Skin:  no rash or induration seen on limited exam Musculoskeletal:  s/p B BKA; stumps C/D/I Psychiatric: blunted mood and affect, speech fluent and appropriate; nurse is currently reporting some confusion/agitation that was not  present at the time of my exam Neurologic:  CN 2-12 grossly intact, moves all extremities in coordinated fashion  Data Reviewed: I have reviewed the patient's lab results since admission.  Pertinent labs for today include:   WBC 10.1 Hgb 6.9     Family Communication: None present; I spoke with his wife by telephone and will have a face to face discussion upon her arrival  Disposition: Status is: Inpatient Remains inpatient appropriate because: ongoing management     Time spent: 50 minutes  Unresulted Labs (From admission,  onward)    None        Author: Lorita Rosa, MD 08/22/2023 8:59 AM  For on call review [email or web address].

## 2023-08-22 NOTE — Progress Notes (Signed)
   08/22/23 1700  Spiritual Encounters  Type of Visit Initial  Care provided to: Pt and family  Conversation partners present during encounter Other (comment) [facility] Care Provider)  Referral source Nurse (RN/NT/LPN)  Reason for visit Urgent spiritual support  OnCall Visit Yes  Spiritual Framework  Presenting Themes Significant life change  Community/Connection Friend(s)  Patient Stress Factors Major life changes  Family Stress Factors Exhausted  Interventions  Spiritual Care Interventions Made Established relationship of care and support;Compassionate presence;Reflective listening;Meditation;Prayer;Encouragement  Intervention Outcomes  Outcomes Connection to spiritual care   Chaplain visited pt and family that is preparing for hospice care. The Hospice care provider came in and explained what they could do to assist. Once hospice left, chaplain remained and provided compassionate presence. After being with family for a few hours prayer was given at the families request.

## 2023-08-22 NOTE — Progress Notes (Signed)
   08/22/23 2100  Spiritual Encounters  Type of Visit Follow up  Care provided to: Patient  Referral source Nurse (RN/NT/LPN)  Reason for visit Routine spiritual support  OnCall Visit Yes  Interventions  Spiritual Care Interventions Made Compassionate presence   Chaplain made a follow up visit with pt. Patient struggled with knowing where they were. Patient kept saying they were stuck on a different floor from the one they were actually located on. Patient asked chaplain several times to remove tube that was used for urinating. Chaplain asked nurse for help so patient would not remove the tube. The nurse help by encouraging pt not to try and remove the tube.

## 2023-08-22 NOTE — IPAL (Signed)
  Interdisciplinary Goals of Care Family Meeting   Date carried out: 08/22/2023  Location of the meeting: Bedside  Member's involved: Physician, Family Member or next of kin, and Palliative care team member  Durable Power of Attorney or acting medical decision maker: wife    Discussion: We discussed goals of care for [employer]. .  Based on inoperative esophageal cancer that is now obstructing the entire esophagus in conjunction with underlying comorbidities making him unsuitable for chemotherapy, patient's prognosis is terminal.  His hemoglobin is marginally improved but there is no intervention available for ongoing GI bleeding.  As such, he is appropriate for hospice.   After prolonged family discussion with patient, wife, and sister-in-law as well as Marine scientist and myself, we are in agreement with transition to comfort measures and plan to discharge home with hospice tomorrow.  He will need a hospital bed delivered to the home prior to discharge and ambulance transport home.  They already have a hoyer lift at home.  [facility] was requested by family based on prior experience and TOC and the hospice liaison have been notified.  Code status:   Code Status: Do not attempt resuscitation (DNR) - Comfort care   Disposition: Home with Hospice  Time spent for the meeting: 45 minutes    Lorita Rosa, MD  08/22/2023, 3:58 PM

## 2023-08-22 NOTE — Consult Note (Signed)
 [facility] CONSULT NOTE  Patient Care Team: Yehuda Helms, MD as PCP - General (Internal Medicine) Wenona Hamilton, MD as PCP - Cardiology (Cardiology) Ardeen Kohler, MD as PCP - Electrophysiology (Cardiology) Gwyn Leos, MD as Consulting Physician (Oncology)  CHIEF COMPLAINTS/PURPOSE OF CONSULTATION:  Esophagus cancer  HISTORY OF PRESENTING ILLNESS:  [name]. 68 y.o.  male pleasant patient with a multiple medical problems including CAD PVD status post bilateral AKA, history of A-fib and history of esophagus cancer status post palliative radiation-status post PEG tube placement, morbid obesity is currently admitted to hospital for respiratory distress.  Of note patient was recently admitted to hospital last month respiratory failure from RSV/aspiration needing intubation.  As per the wife patient also had 4 days of black-colored stool.  Patient noted to be anemic in the emergency room with a hemoglobin of 7.  Patient's status post PRBC transfusion.  Patient also status post antibiotics as he met sepsis criteria.   Patient underwent a EGD that showed obstructive esophagus malignancy.  Oncology has been consulted for further recommendations/goals of care discussion.  Patient is resting comfortably in the bed.  Denies any pain.  He is in fact concerned about his tube feeds being currently on hold.  Pt  is a poor historian.   Review of Systems  Unable to perform ROS: Other    MEDICAL HISTORY:  Past Medical History:  Diagnosis Date   Arrhythmia    atrial fibrillation   Asthma    CHF (congestive heart failure) (HCC)    COPD (chronic obstructive pulmonary disease) (HCC)    Coronary artery disease    Depression    Diabetes mellitus without complication (HCC)    Gout    History anabolic steroid use    Hyperlipidemia    Hypertension    Hypogonadism in male    MI (myocardial infarction) (HCC)    Morbid obesity (HCC)    Myocardial  infarction (HCC)    Peripheral vascular disease (HCC)    Perirectal abscess    Pleurisy    Sleep apnea    CPAP at night, no oxygen    Varicella     SURGICAL HISTORY: Past Surgical History:  Procedure Laterality Date   ABDOMINAL AORTIC ANEURYSM REPAIR     ACHILLES TENDON SURGERY Left 01/10/2021   Procedure: ACHILLES LENGTHENING/KIDNER;  Surgeon: Pink Bridges, DPM;  Location: [facility] ORS;  Service: Podiatry;  Laterality: Left;   AMPUTATION Left 10/03/2021   Procedure: AMPUTATION BELOW KNEE;  Surgeon: Jackquelyn Mass, MD;  Location: [facility];  Service: Vascular;  Laterality: Left;   AMPUTATION Wood 05/24/2022   Procedure: AMPUTATION BELOW KNEE;  Surgeon: Celso College, MD;  Location: [facility];  Service: General;  Laterality: Wood;   AMPUTATION TOE Wood 02/10/2016   Procedure: AMPUTATION TOE 3RD TOE;  Surgeon: Anell Baptist, DPM;  Location: [facility];  Service: Podiatry;  Laterality: Wood;   AMPUTATION TOE Left 02/24/2020   Procedure: AMPUTATION TOE;  Surgeon: Pink Bridges, DPM;  Location: [facility];  Service: Podiatry;  Laterality: Left;   APPLICATION OF WOUND VAC Left 02/29/2020   Procedure: APPLICATION OF WOUND VAC;  Surgeon: Pink Bridges, DPM;  Location: [facility];  Service: Podiatry;  Laterality: Left;   BIOPSY  03/15/2023   Procedure: BIOPSY;  Surgeon: Luke Salaam, MD;  Location: [facility] ENDOSCOPY;  Service: Gastroenterology;;   BIOPSY  04/10/2023   Procedure: BIOPSY;  Surgeon: Toledo, Teodoro K, MD;  Location: [facility] ENDOSCOPY;  Service: Gastroenterology;;  CARDIAC CATHETERIZATION     CARDIOVERSION [name] 02/13/2022   Procedure: CARDIOVERSION;  Surgeon: Michelle Aid, MD;  Location: [facility];  Service: Cardiovascular;  Laterality: [name];   CARDIOVERSION [name] 04/23/2023   Procedure: CARDIOVERSION;  Surgeon: Devorah Fonder, MD;  Location: [facility];  Service: Cardiovascular;  Laterality: [name];   COLONOSCOPY WITH PROPOFOL  [name] 11/18/2015   Procedure: COLONOSCOPY WITH PROPOFOL ;  Surgeon:  Cassie Click, MD;  Location: [facility] ENDOSCOPY;  Service: Endoscopy;  Laterality: [name];   CORONARY ARTERY BYPASS GRAFT     CORONARY STENT INTERVENTION [name] 02/02/2020   Procedure: CORONARY STENT INTERVENTION;  Surgeon: Percival Brace, MD;  Location: [facility] INVASIVE CV LAB;  Service: Cardiovascular;  Laterality: [name];   ESOPHAGOGASTRODUODENOSCOPY [name] 08/21/2023   Procedure: EGD (ESOPHAGOGASTRODUODENOSCOPY);  Surgeon: Marnee Sink, MD;  Location: [facility] ENDOSCOPY;  Service: Endoscopy;  Laterality: [name];   ESOPHAGOGASTRODUODENOSCOPY (EGD) WITH PROPOFOL  [name] 03/15/2023   Procedure: ESOPHAGOGASTRODUODENOSCOPY (EGD) WITH PROPOFOL ;  Surgeon: Luke Salaam, MD;  Location: [facility] ENDOSCOPY;  Service: Gastroenterology;  Laterality: [name];   FLEXIBLE SIGMOIDOSCOPY [name] 04/10/2023   Procedure: FLEXIBLE SIGMOIDOSCOPY;  Surgeon: Toledo, Alphonsus Jeans, MD;  Location: [facility] ENDOSCOPY;  Service: Gastroenterology;  Laterality: [name];   GASTROSTOMY [name] 03/18/2023   Procedure: INSERTION OF GASTROSTOMY TUBE;  Surgeon: Eldred Grego, MD;  Location: [facility];  Service: General;  Laterality: [name];   IMPACTION REMOVAL  03/15/2023   Procedure: IMPACTION REMOVAL;  Surgeon: Luke Salaam, MD;  Location: [facility] ENDOSCOPY;  Service: Gastroenterology;;   INCISION AND DRAINAGE Left 08/07/2021   Procedure: INCISION AND DRAINAGE-Partial Calcanectomy;  Surgeon: Pink Bridges, DPM;  Location: [facility];  Service: Podiatry;  Laterality: Left;   IRRIGATION AND DEBRIDEMENT FOOT Left 02/29/2020   Procedure: IRRIGATION AND DEBRIDEMENT FOOT;  Surgeon: Pink Bridges, DPM;  Location: [facility];  Service: Podiatry;  Laterality: Left;   IRRIGATION AND DEBRIDEMENT FOOT Left 02/24/2020   Procedure: IRRIGATION AND DEBRIDEMENT FOOT;  Surgeon: Pink Bridges, DPM;  Location: [facility];  Service: Podiatry;  Laterality: Left;   KNEE ARTHROSCOPY     LEFT HEART CATH AND CORS/GRAFTS ANGIOGRAPHY [name] 02/02/2020   Procedure: LEFT HEART CATH AND CORS/GRAFTS ANGIOGRAPHY;   Surgeon: Ronney Cola, MD;  Location: [facility] INVASIVE CV LAB;  Service: Cardiovascular;  Laterality: [name];   LOWER EXTREMITY ANGIOGRAPHY Left 02/25/2020   Procedure: Lower Extremity Angiography;  Surgeon: Celso College, MD;  Location: [facility] CV LAB;  Service: Cardiovascular;  Laterality: Left;   LOWER EXTREMITY ANGIOGRAPHY Left 01/04/2021   Procedure: LOWER EXTREMITY ANGIOGRAPHY;  Surgeon: Celso College, MD;  Location: [facility] CV LAB;  Service: Cardiovascular;  Laterality: Left;   LOWER EXTREMITY ANGIOGRAPHY Wood 05/21/2022   Procedure: Lower Extremity Angiography;  Surgeon: Celso College, MD;  Location: [facility] CV LAB;  Service: Cardiovascular;  Laterality: Wood;   METATARSAL HEAD EXCISION Left 01/10/2021   Procedure: METATARSAL HEAD EXCISION - LEFT 5th;  Surgeon: Pink Bridges, DPM;  Location: [facility];  Service: Podiatry;  Laterality: Left;   PERIPHERAL VASCULAR CATHETERIZATION Wood 01/24/2016   Procedure: Lower Extremity Angiography;  Surgeon: Jackquelyn Mass, MD;  Location: [facility] CV LAB;  Service: Cardiovascular;  Laterality: Wood;   PERIPHERAL VASCULAR CATHETERIZATION Wood 01/25/2016   Procedure: Lower Extremity Angiography;  Surgeon: Jackquelyn Mass, MD;  Location: [facility] CV LAB;  Service: Cardiovascular;  Laterality: Wood;   PORTACATH PLACEMENT [name] 03/18/2023   Procedure: INSERTION PORT-A-CATH;  Surgeon: Eldred Grego, MD;  Location: [facility];  Service: General;  Laterality: [name];  TOE AMPUTATION     TONSILLECTOMY      SOCIAL HISTORY: Social History   Socioeconomic History   Marital status: Married    Spouse name: Not on file   Number of children: Not on file   Years of education: Not on file   Highest education level: Not on file  Occupational History   Not on file  Tobacco Use   Smoking status: Former    Current packs/day: 0.00    Average packs/day: 0.5 packs/day for 45.0 years (22.5 ttl pk-yrs)    Types: Cigarettes    Start  date: 04/04/1970    Quit date: 04/05/2015    Years since quitting: 8.3   Smokeless tobacco: Never  Vaping Use   Vaping status: Every Day  Substance and Sexual Activity   Alcohol  use: Not Currently    Alcohol /week: 3.0 standard drinks of alcohol     Types: 3 Glasses of wine per week   Drug use: No   Sexual activity: Not on file  Other Topics Concern   Not on file  Social History Narrative   Not on file   Social Drivers of Health   Financial Resource Strain: Low Risk  (09/28/2022)   Received from [facility]   Overall Financial Resource Strain (CARDIA)    Difficulty of Paying Living Expenses: Not hard at all  Food Insecurity: No Food Insecurity (08/21/2023)   Hunger Vital Sign    Worried About Running Out of Food in the Last Year: Never true    Ran Out of Food in the Last Year: Never true  Transportation Needs: No Transportation Needs (08/21/2023)   PRAPARE - Administrator, Civil Service (Medical): No    Lack of Transportation (Non-Medical): No  Physical Activity: Not on file  Stress: Not on file  Social Connections: Socially Isolated (08/21/2023)   Social Connection and Isolation Panel [NHANES]    Frequency of Communication with Friends and Family: Never    Frequency of Social Gatherings with Friends and Family: Never    Attends Religious Services: Never    Database administrator or Organizations: No    Attends Banker Meetings: Never    Marital Status: Married  Catering manager Violence: Not At Risk (08/21/2023)   Humiliation, Afraid, Rape, and Kick questionnaire    Fear of Current or Ex-Partner: No    Emotionally Abused: No    Physically Abused: No    Sexually Abused: No    FAMILY HISTORY: Family History  Problem Relation Age of Onset   Hypertension Father    Coronary artery disease Father    Alcohol  abuse Father    Heart failure Brother     ALLERGIES:  is allergic to penicillins, statins, and metformin  and  related.  MEDICATIONS:  Current Facility-Administered Medications  Medication Dose Route Frequency Provider Last Rate Last Admin   acetaminophen  (TYLENOL ) tablet 650 mg  650 mg Per Tube Q6H PRN Lorita Rosa, MD       Or   acetaminophen  (TYLENOL ) suppository 650 mg  650 mg Rectal Q6H PRN Lorita Rosa, MD       albuterol  (PROVENTIL ) (2.5 MG/3ML) 0.083% nebulizer solution 2.5 mg  2.5 mg Nebulization Q2H PRN Marnee Sink, MD       Cecily Cohen ON 08/23/2023] amiodarone  (PACERONE ) tablet 200 mg  200 mg Per Tube Daily Lorita Rosa, MD       antiseptic oral rinse (BIOTENE) solution 15 mL  15 mL Topical PRN Lorita Rosa, MD  clonazePAM  (KLONOPIN ) tablet 0.5 mg  0.5 mg Per Tube BID Marnee Sink, MD   0.5 mg at 08/22/23 2125   diphenhydrAMINE  (BENADRYL ) injection 12.5 mg  12.5 mg Intravenous Q4H PRN Lorita Rosa, MD   12.5 mg at 08/22/23 2120   finasteride  (PROSCAR ) tablet 5 mg  5 mg Oral Daily Wohl, Darren, MD   5 mg at 08/22/23 1610   gabapentin  (NEURONTIN ) 250 MG/5ML solution 300 mg  300 mg Per Tube QHS Yates, Jennifer, MD       glycopyrrolate  (ROBINUL ) tablet 1 mg  1 mg Oral Q4H PRN Lorita Rosa, MD       Or   glycopyrrolate  (ROBINUL ) injection 0.2 mg  0.2 mg Subcutaneous Q4H PRN Lorita Rosa, MD       Or   glycopyrrolate  (ROBINUL ) injection 0.2 mg  0.2 mg Intravenous Q4H PRN Lorita Rosa, MD       haloperidol  (HALDOL ) tablet 0.5 mg  0.5 mg Oral Q4H PRN Lorita Rosa, MD       Or   haloperidol  (HALDOL ) 2 MG/ML solution 0.5 mg  0.5 mg Sublingual Q4H PRN Lorita Rosa, MD       Or   haloperidol  lactate (HALDOL ) injection 0.5 mg  0.5 mg Intravenous Q4H PRN Lorita Rosa, MD       levothyroxine  (SYNTHROID ) tablet 25 mcg  25 mcg Per Tube Q0600 Wohl, Darren, MD   25 mcg at 08/22/23 0537   LORazepam  (ATIVAN ) tablet 1 mg  1 mg Oral Q4H PRN Lorita Rosa, MD   1 mg at 08/22/23 2126   Or   LORazepam  (ATIVAN ) 2 MG/ML concentrated solution 1 mg  1 mg Sublingual Q4H PRN Lorita Rosa, MD       Or   LORazepam  (ATIVAN ) injection 1 mg  1 mg Intravenous Q4H PRN Lorita Rosa, MD       morphine  CONCENTRATE 10 mg / 0.5 ml oral solution 5 mg  5 mg Oral Q2H PRN Lorita Rosa, MD       Or   morphine  CONCENTRATE 10 mg / 0.5 ml oral solution 5 mg  5 mg Sublingual Q2H PRN Lorita Rosa, MD       nitroGLYCERIN  (NITROSTAT ) SL tablet 0.4 mg  0.4 mg Sublingual Q5 min PRN Marnee Sink, MD   0.4 mg at 08/21/23 9604   ondansetron  (ZOFRAN ) tablet 4 mg  4 mg Per Tube Q6H PRN Lorita Rosa, MD       Or   ondansetron  (ZOFRAN ) injection 4 mg  4 mg Intravenous Q6H PRN Lorita Rosa, MD       Oral care mouth rinse  15 mL Mouth Rinse PRN Lorita Rosa, MD       pantoprazole  (PROTONIX ) EC tablet 40 mg  40 mg Oral BID Lorita Rosa, MD   40 mg at 08/22/23 2126   polyvinyl alcohol  (LIQUIFILM TEARS) 1.4 % ophthalmic solution 1 drop  1 drop Both Eyes QID PRN Lorita Rosa, MD       pramipexole  (MIRAPEX ) tablet 2 mg  2 mg Per Tube QHS Wohl, Darren, MD   2 mg at 08/21/23 2118   primidone  (MYSOLINE ) tablet 250 mg  250 mg Per Tube BID Marnee Sink, MD   250 mg at 08/22/23 0930   tamsulosin  (FLOMAX ) capsule 0.4 mg  0.4 mg Oral Daily Marnee Sink, MD   0.4 mg at 08/22/23 5409    PHYSICAL EXAMINATION:   Vitals:   08/22/23 1844 08/22/23 2013  BP: 107/65 117/61  Pulse: Aaron Aas)  103 (!) 103  Resp: 18 18  Temp: 98.5 F (36.9 C) (!) 97.5 F (36.4 C)  SpO2: 96% 96%   Filed Weights   08/21/23 2241 08/22/23 0500  Weight: 252 lb 13.9 oz (114.7 kg) 251 lb 5.2 oz (114 kg)  Bilateral BKA.  PEG  tube in place.  Physical Exam Vitals and nursing note reviewed.  HENT:     Head: Normocephalic and atraumatic.     Mouth/Throat:     Pharynx: Oropharynx is clear.  Eyes:     Extraocular Movements: Extraocular movements intact.     Pupils: Pupils are equal, round, and reactive to light.  Cardiovascular:     Rate and Rhythm: Normal rate and regular rhythm.  Pulmonary:     Comments:  Decreased breath sounds bilaterally.  Abdominal:     Palpations: Abdomen is soft.  Musculoskeletal:        General: Normal range of motion.     Cervical back: Normal range of motion.  Skin:    General: Skin is warm.  Neurological:     General: No focal deficit present.     Mental Status: He is alert and oriented to person, place, and time.  Psychiatric:        Behavior: Behavior normal.        Judgment: Judgment normal.     LABORATORY DATA:  I have reviewed the data as listed Lab Results  Component Value Date   WBC 16.0 (H) 08/22/2023   HGB 8.2 (L) 08/22/2023   HCT 24.8 (L) 08/22/2023   MCV 89.2 08/22/2023   PLT 278 08/22/2023   Recent Labs    07/15/23 1549 07/22/23 1249 08/20/23 2247 08/21/23 0500  NA 134* 129* 138 135  K 4.3 4.0 3.8 4.3  CL 96* 92* 104 101  CO2 30 26 25 27   GLUCOSE 226* 238* 235* 269*  BUN 35* 32* 59* 70*  CREATININE 0.85 0.93 0.95 1.13  CALCIUM  9.2 8.4* 8.2* 8.7*  GFRNONAA >60 >60 >60 >60  PROT 6.7 7.0 6.1*  --   ALBUMIN  2.6* 2.7* 2.4*  --   AST 28 18 26   --   ALT 16 19 37  --   ALKPHOS 106 109 113  --   BILITOT 0.5 0.3 0.4  --     RADIOGRAPHIC STUDIES: I have personally reviewed the radiological images as listed and agreed with the findings in the report. DG Chest Port 1 View Result Date: 08/20/2023 CLINICAL DATA:  Shortness of breath EXAM: PORTABLE CHEST 1 VIEW COMPARISON:  07/15/2023 FINDINGS: Wood chest wall Port-A-Cath tip in the mid SVC. Stable cardiomediastinal silhouette. Sternotomy and CABG. Ectasias of the ascending aorta. Interstitial and patchy airspace opacities in the left lower lung. No pleural effusion or pneumothorax. IMPRESSION: Interstitial and patchy airspace opacities in the left lower lung may atelectasis or pneumonia. Electronically Signed   By: Rozell Cornet M.D.   On: 08/20/2023 22:48   CT Abdomen Pelvis W Contrast Result Date: 08/19/2023 CLINICAL DATA:  Follow-up Wood renal parenchymal laceration or  retroperitoneal hematoma EXAM: CT ABDOMEN AND PELVIS WITH CONTRAST TECHNIQUE: Multidetector CT imaging of the abdomen and pelvis was performed using the standard protocol following bolus administration of intravenous contrast. RADIATION DOSE REDUCTION: This exam was performed according to the departmental dose-optimization program which includes automated exposure control, adjustment of the mA and/or kV according to patient size and/or use of iterative reconstruction technique. CONTRAST:  OMNIPAQUE  IOHEXOL  300 MG/ML  SOLN COMPARISON:  June 14, 2023  FINDINGS: Lower chest: Small bilateral pleural reactions and minute Wood pleural effusion with minimal basilar hypoventilatory atelectasis Hepatobiliary: No focal liver abnormality is seen. No gallstones, gallbladder wall thickening, or biliary dilatation. Pancreas: Unremarkable. No pancreatic ductal dilatation or surrounding inflammatory changes. Spleen: Normal in size without focal abnormality. No change in the capsular calcification of the spleen about 1 cm in maximum diameter. Adrenals/Urinary Tract: Comparison with prior examination accounting for differences in techniques again noted is the presence of a complex bilobed fluid collection in the Wood posterior retroperitoneal soft tissues extending into the adjacent iliopsoas muscle and Wood lumbar muscle. Measuring 10 cm in maximum transverse diameter, 3 point 7 cm in AP diameter and 10 cm in longitudinal diameter. Overall appears unchanged since prior examination. Slightly displaced in the lower pole of the Wood kidney anteriorly. Collection appears better defined more hypodense and could correlate with resolving hematoma. Small amount of fluid of the hepatorenal fossa with minimal stranding similar but decreased since prior examination. Left kidney is unremarkable. The left adrenal gland remains enlarged similar to prior examination better defined on the coronal images measuring about 1.7 x 2 cm in  diameter. Follow-up recommended. Stomach/Bowel: Stomach is within normal limits. Appendix appears normal. No evidence of bowel wall thickening, distention, or inflammatory changes. Abundant residual fecal material throughout the left side of the colon and rectosigmoid colon with mild fecal impaction in the rectum Gastrostomy tube in the stomach in good position Vascular/Lymphatic: Aortic atherosclerosis. No enlarged abdominal or pelvic lymph nodes. Reproductive: Prostate is unremarkable. Other: No abdominal wall hernia or abnormality. No abdominopelvic ascites. Musculoskeletal: No acute or significant osseous findings. IMPRESSION: *No change in the size of the complex bilobed fluid collection in the Wood posterior retroperitoneal soft tissues extending into the adjacent iliopsoas muscle and Wood lumbar muscle. Collection appears better defined more hypodense and could correlate with resolving hematoma. *Small amount of fluid in the hepatorenal fossa with minimal stranding similar but decreased since prior examination. *Left adrenal gland remains enlarged similar to prior examination better defined on the coronal images measuring about 1.7 x 2 cm in diameter. Follow-up recommended. *Abundant residual fecal material throughout the left side of the colon and rectosigmoid colon with mild fecal impaction in the rectum. *Small bilateral pleural reactions and minute Wood pleural effusion with minimal basilar hypoventilatory atelectasis. *Aortic atherosclerosis. Electronically Signed   By: Fredrich Jefferson M.D.   On: 08/19/2023 37:66   68 year old male patient with multiple medical problems including CAD PVD status post bilateral AKA, history of A-fib and history of esophagus cancer status post palliative radiation-status post PEG tube placement, morbid obesity is currently admitted to hospital for respiratory distress/melena and severe anemia.  # Stage IV esophageal cancer status post palliative radiation-EGD shows  progressive disease with obstruction.  # Severe anemia/melena-secondary to esophagus malignancy-hemoglobin 6.7 post PRBC transfusion.  #.  Multiple other comorbidities : A-fib-not a candidate for anticoagulation; poorly controlled diabetes;   # ECOG performance status 3-4  # Recommendations and plan:  # Patient is a poor candidate for any further therapy from oncology standpoint given he has progressed post palliative radiation.  Patient is not a candidate for any systemic therapy given his multiple comorbidities.  # Recommend hospice.  Discussed with [employer] and also primary service.  I tried to reach the patient's wife left a voicemail to discuss my recommendations.  Thank you Dr.Yates for allowing me to participate in the care of your pleasant patient. Please do not hesitate to contact me with questions  or concerns in the interim.    Gwyn Leos, MD 08/22/2023 9:55 PM

## 2023-08-22 NOTE — TOC Initial Note (Signed)
 Transition of Care (TOC) - Initial/Assessment Note    Patient Details  Name: [name]. MRN: [id number] Date of Birth: [birth year]  Transition of [facility] CM/SW Contact:    Alcario Tinkey C Crystalann Korf, RN Phone Number: 08/22/2023, 4:00 PM  Clinical Narrative:  Message received from Josh, Palliative NP stating patient's family is requesting hospice care via [facility].   Attempt to reach patient's spouse by phone and at room number. No answer. Referral forwarded to [facility] from [facility].                        Patient Goals and CMS Choice            Expected Discharge Plan and Services                                              Prior Living Arrangements/Services                       Activities of Daily Living   ADL Screening (condition at time of admission) Independently performs ADLs?: No Does the patient have a NEW difficulty with bathing/dressing/toileting/self-feeding that is expected to last >3 days?: No Does the patient have a NEW difficulty with getting in/out of bed, walking, or climbing stairs that is expected to last >3 days?: No Does the patient have a NEW difficulty with communication that is expected to last >3 days?: No Is the patient deaf or have difficulty hearing?: No Does the patient have difficulty seeing, even when wearing glasses/contacts?: No Does the patient have difficulty concentrating, remembering, or making decisions?: No  Permission Sought/Granted                  Emotional Assessment              Admission diagnosis:  Shortness of breath [R06.02] HCAP (healthcare-associated pneumonia) [J18.9] Fever, unspecified fever cause [R50.9] Sepsis due to pneumonia (HCC) [J18.9, A41.9] Anemia, unspecified type [D64.9] Sepsis, due to unspecified organism, unspecified whether acute organ dysfunction present [facility]) [A41.9] Patient Active Problem List   Diagnosis Date Noted   Sepsis due to pneumonia( HCAP vs  aspiration) (HCC) 08/21/2023   Acute blood loss anemia 08/21/2023   Gastrostomy tube dependent (HCC) 08/21/2023   Upper gastrointestinal bleed 08/21/2023   Acute posthemorrhagic anemia 08/21/2023   SIRS (systemic inflammatory response syndrome) (HCC) 08/21/2023   Rhinovirus 07/03/2023   HCAP (healthcare-associated pneumonia) 07/03/2023   Encephalopathy 07/03/2023   Pulmonary infiltrate in Wood lung on CXR 06/29/2023   Chest pain 06/14/2023   SOB (shortness of breath) 04/23/2023   Malignant neoplasm of lower third of esophagus (HCC) 04/11/2023   Palliative care encounter 04/11/2023   Atrial flutter with rapid ventricular response (HCC) 04/07/2023   Diarrhea 04/05/2023   Perinephric fluid collection 04/05/2023   Persistent atrial fibrillation (HCC) 04/05/2023   Typical atrial flutter (HCC) 04/05/2023   Diarrhea of infectious origin 04/05/2023   Acute hyperkalemia 03/28/2023   Esophageal adenocarcinoma (HCC) 03/15/2023   Dysphagia 03/14/2023   Coronary artery disease 03/14/2023   Type 2 diabetes mellitus with peripheral neuropathy (HCC) 03/14/2023   Status post below-knee amputation of Wood lower extremity (HCC) 05/31/2022   S/P amputation 05/30/2022   Poorly controlled diabetes mellitus (HCC) 05/30/2022   Acute osteomyelitis of Wood foot (HCC) 05/19/2022   Wound infection 05/18/2022  Ulcer of Wood ankle, with fat layer exposed (HCC) 05/18/2022   Non-healing wound of lower extremity, initial encounter 05/18/2022   CAD with Hx of CABG 05/18/2022   Chronic respiratory failure with hypoxia (HCC) 05/18/2022   S/P BKA (below knee amputation), left (HCC) 05/18/2022   COPD (chronic obstructive pulmonary disease) (HCC) 05/18/2022   Perinephric hematoma 03/20/2022   Bacteremia due to Klebsiella pneumoniae 03/19/2022   Hyperglycemia 03/16/2022   Septic shock (HCC) 03/16/2022   Sepsis secondary to UTI (HCC) 03/15/2022   Acute respiratory failure with hypoxia (HCC) 02/13/2022   Atrial  fibrillation/flutter (HCC) 02/11/2022   Uncontrolled type 2 diabetes mellitus with hyperglycemia, with long-term current use of insulin  (HCC) 02/11/2022   Dyslipidemia 02/11/2022   Hypothyroidism 02/11/2022   Benign prostatic hyperplasia with lower urinary tract symptoms 02/11/2022   Pressure injury of skin of heel 11/04/2021   UTI (urinary tract infection) 10/22/2021   Iron deficiency anemia 10/19/2021   Hyperkalemia 10/14/2021   Acute urinary retention 10/08/2021   Sepsis due to methicillin resistant Staphylococcus aureus (MRSA) (HCC) 10/04/2021   Delirium 10/04/2021   Acute on chronic respiratory failure with hypoxia and hypercapnia (HCC) 10/04/2021   Acute respiratory failure with hypoxia and hypercapnia (HCC) 10/03/2021   OSA (obstructive sleep apnea) 10/03/2021   Malnutrition of moderate degree 09/29/2021   Generalized weakness 09/29/2021   Cellulitis of left foot 08/04/2021   Uncontrolled type 2 diabetes mellitus with hypoglycemia, with long-term current use of insulin  (HCC) 08/04/2021   Bilateral lower extremity edema 08/04/2021   Acute osteomyelitis of lower leg, left (HCC) 08/04/2021   Atrial fibrillation, chronic (HCC) 08/04/2021   Swelling of limb 07/18/2021   CHF exacerbation (HCC) 05/08/2021   Diabetes mellitus without complication (HCC)    Atrial fibrillation with RVR (HCC)    Obesity, Class III, BMI 40-49.9 (morbid obesity) (HCC)    Venous stasis ulcer (HCC)    Sinus tachycardia    BPH (benign prostatic hyperplasia)    Chronic anticoagulation    Anasarca    Chronic diastolic CHF (congestive heart failure) (HCC)    Venous stasis of both lower extremities    Paroxysmal atrial fibrillation (HCC)    Lower extremity cellulitis 01/03/2021   Chronic osteomyelitis of left foot (HCC) 01/03/2021   Osteomyelitis (HCC) 01/03/2021   Severe sepsis (HCC) 02/24/2020   Acute kidney injury (HCC) 02/24/2020   Bilateral cellulitis of lower leg 02/23/2020   CAD (coronary artery  disease) 02/02/2020   RLS (restless legs syndrome) 05/15/2019   Statin myopathy 05/15/2019   Type 2 diabetes mellitus with obesity (HCC) 02/13/2016   Essential hypertension 02/13/2016   PAD (peripheral artery disease) (HCC) 02/13/2016   Pressure injury of skin 01/25/2016   Hypoglycemia associated with diabetes (HCC) 12/27/2013   Long-term insulin  use (HCC) 12/27/2013   Microalbuminuria 12/27/2013   B12 deficiency 09/29/2013   Obesity 09/29/2013   CAD (coronary artery disease), autologous vein bypass graft 07/20/2013   Hypersomnia with sleep apnea 07/20/2013   Pure hypercholesterolemia 07/20/2013   Osteomyelitis of ankle or foot 07/20/2011   PCP:  Yehuda Helms, MD Pharmacy:   [facility] - [place], [place] - [address] [place] [address] Phone: [phone] Fax: [phone]     Social Drivers of Health (SDOH) Social History: SDOH Screenings   Food Insecurity: No Food Insecurity (08/21/2023)  Housing: Low Risk  (08/21/2023)  Transportation Needs: No Transportation Needs (08/21/2023)  Utilities: Not At Risk (08/21/2023)  Depression (PHQ2-9):  Low Risk  (11/21/2021)  Financial Resource Strain: Low Risk  (09/28/2022)   Received from [facility]  Social Connections: Socially Isolated (08/21/2023)  Tobacco Use: Medium Risk (08/21/2023)   SDOH Interventions:     Readmission Risk Interventions    02/13/2022    4:57 PM 09/29/2021   11:12 AM  Readmission Risk Prevention Plan  Transportation Screening Complete Complete  PCP or Specialist Appt within 3-5 Days  Complete  HRI or Home Care Consult  Complete  Social Work Consult for Recovery Care Planning/Counseling  Complete  Palliative Care Screening  Not Applicable  Medication Review Oceanographer) Complete Complete  PCP or Specialist appointment within 3-5 days of discharge Complete   HRI or Home Care Consult Complete   SW Recovery Care/Counseling Consult Complete   Palliative  Care Screening Not Applicable   Skilled Nursing Facility Not Applicable

## 2023-08-22 NOTE — Progress Notes (Signed)
 [facility], Room 257[facility] Liaison Note  Received request from Keona Alston, RN, Transitions of Care Manager, for hospice services at home after discharge.  Spoke with patient and spouse to initiate education related to hospice philosophy, services, and team approach to care.  Patient and spouse verbalized understanding of information given.  Per discussion, the plan is for discharge home tomorrow.    DME needs discussed.  Patient has the following equipment in the home. WC,BSC, shower chair, hoyer lift, walker.    Patient/family requests the following equipment in the home:  Hospital bed.   The address has been verified and is correct in the chart. [name] Santizo and phone number [phone] is the family contact to arrange time of equipment delivery.    Please send signed and completed DNR home with the patient/family.  Please provide prescriptions at discharge as needed to ensure ongoing symptom management.   [facility] information and contact numbers given to spouse.   Above information shared with Keona Alston, RN, Transitions of Care Manager.     Please call with any Hospice related questions or concerns.  Thank you for the opportunity to participate in this patient's care.  Helmut Lobe, [facility] Liaison [phone]

## 2023-08-22 NOTE — Progress Notes (Signed)
 The patient had a repeat hemoglobin today with his hemoglobin being 8.2 today.  There is no further sign of any GI bleeding.  He denies any abdominal pain or having any black stools.  The patient had an upper endoscopy yesterday with a nearly obstructing mass in the distal esophagus with the scope not being able to pass this mass. Recommendations for any further bleeding would be to avoid NSAIDs and if there is further melanotic stools then having the patient undergo a CT angiography with possible embolization by vascular surgery of any bleeding vessels.  Nothing further to do from a GI point of view.  I will sign off.  Please call if any further GI concerns or questions.  We would like to thank you for the opportunity to participate in the care of [name].Aaron Aas

## 2023-08-22 NOTE — Hospital Course (Signed)
 15XW with h/o CAD, afib, chronic HFpEF, esophageal CA s/p chemo/rads with G-tube dependence, PAD s/p B AKA, DM, renal mass with chronic hematuria (last seen by urology 4/16), OSA, and morbid obesity who presented on 4/22 with SOB.  He was last hospitalized from 3/1-10 with respiratory failure from RSV and/or aspiration PNA requiring intubation but was weaned off O2 prior to dc.  Sat 88% on RA.  Reported 4 days of black stools, Hgb 7 (10 a month ago).  CXR with interstitial and patchy infiltrates in LLL, started on Cefepime  and Vanc for HCAP.  Transfused 1 unit PRBC overnight on 4/23 and then 2 more on 4/24.  EGD with complete esophageal occlusion, unable to pass scope.  Oncology consulted and agrees with hospice/DNR.  Palliative care consulting.

## 2023-08-22 NOTE — Progress Notes (Signed)
 Report called to [name] on 1C patient to transfer to 116. Family at bedside and aware.

## 2023-08-23 DIAGNOSIS — K922 Gastrointestinal hemorrhage, unspecified: Secondary | ICD-10-CM | POA: Diagnosis not present

## 2023-08-23 DIAGNOSIS — Z515 Encounter for palliative care: Secondary | ICD-10-CM | POA: Diagnosis not present

## 2023-08-23 LAB — GLUCOSE, CAPILLARY: Glucose-Capillary: 173 mg/dL — ABNORMAL HIGH (ref 70–99)

## 2023-08-23 MED ORDER — PANTOPRAZOLE SODIUM 40 MG PO TBEC
40.0000 mg | DELAYED_RELEASE_TABLET | Freq: Two times a day (BID) | ORAL | 0 refills | Status: DC
Start: 2023-08-23 — End: 2023-09-12

## 2023-08-23 NOTE — Plan of Care (Signed)
  Problem: Education: Goal: Ability to describe self-care measures that may prevent or decrease complications (Diabetes Survival Skills Education) will improve Outcome: Not Progressing Goal: Individualized Educational Video(s) Outcome: Not Progressing   Problem: Coping: Goal: Ability to adjust to condition or change in health will improve Outcome: Not Progressing   Problem: Fluid Volume: Goal: Ability to maintain a balanced intake and output will improve Outcome: Progressing   Problem: Health Behavior/Discharge Planning: Goal: Ability to identify and utilize available resources and services will improve Outcome: Progressing Goal: Ability to manage health-related needs will improve Outcome: Progressing   Problem: Metabolic: Goal: Ability to maintain appropriate glucose levels will improve Outcome: Progressing   Problem: Nutritional: Goal: Maintenance of adequate nutrition will improve Outcome: Progressing Goal: Progress toward achieving an optimal weight will improve Outcome: Progressing   Problem: Skin Integrity: Goal: Risk for impaired skin integrity will decrease Outcome: Progressing   Problem: Tissue Perfusion: Goal: Adequacy of tissue perfusion will improve Outcome: Progressing   Problem: Fluid Volume: Goal: Hemodynamic stability will improve Outcome: Progressing   Problem: Clinical Measurements: Goal: Diagnostic test results will improve Outcome: Progressing Goal: Signs and symptoms of infection will decrease Outcome: Progressing   Problem: Respiratory: Goal: Ability to maintain adequate ventilation will improve Outcome: Progressing   Problem: Education: Goal: Knowledge of General Education information will improve Description: Including pain rating scale, medication(s)/side effects and non-pharmacologic comfort measures Outcome: Progressing   Problem: Health Behavior/Discharge Planning: Goal: Ability to manage health-related needs will improve Outcome:  Progressing   Problem: Clinical Measurements: Goal: Ability to maintain clinical measurements within normal limits will improve Outcome: Progressing Goal: Will remain free from infection Outcome: Progressing Goal: Diagnostic test results will improve Outcome: Progressing Goal: Respiratory complications will improve Outcome: Progressing Goal: Cardiovascular complication will be avoided Outcome: Progressing   Problem: Activity: Goal: Risk for activity intolerance will decrease Outcome: Progressing   Problem: Nutrition: Goal: Adequate nutrition will be maintained Outcome: Progressing   Problem: Coping: Goal: Level of anxiety will decrease Outcome: Progressing   Problem: Elimination: Goal: Will not experience complications related to bowel motility Outcome: Progressing Goal: Will not experience complications related to urinary retention Outcome: Progressing   Problem: Pain Managment: Goal: General experience of comfort will improve and/or be controlled Outcome: Progressing   Problem: Safety: Goal: Ability to remain free from injury will improve Outcome: Progressing   Problem: Skin Integrity: Goal: Risk for impaired skin integrity will decrease Outcome: Progressing   Problem: Education: Goal: Knowledge of the prescribed therapeutic regimen will improve Outcome: Progressing   Problem: Coping: Goal: Ability to identify and develop effective coping behavior will improve Outcome: Progressing   Problem: Clinical Measurements: Goal: Quality of life will improve Outcome: Progressing   Problem: Respiratory: Goal: Verbalizations of increased ease of respirations will increase Outcome: Progressing   Problem: Role Relationship: Goal: Family's ability to cope with current situation will improve Outcome: Progressing Goal: Ability to verbalize concerns, feelings, and thoughts to partner or family member will improve Outcome: Progressing   Problem: Pain  Management: Goal: Satisfaction with pain management regimen will improve Outcome: Progressing

## 2023-08-23 NOTE — Progress Notes (Signed)
 Palliative Medicine [facility] at [facility] [phone] [phone]   Name: Lizbeth Right. Date: 08/23/2023 MRN: [id number]  DOB: 12-11-1955  Patient Care Team: Yehuda Helms, MD as PCP - General (Internal Medicine) Wenona Hamilton, MD as PCP - Cardiology (Cardiology) Ardeen Kohler, MD as PCP - Electrophysiology (Cardiology) Gwyn Leos, MD as Consulting Physician (Oncology)    REASON FOR CONSULTATION: Lizbeth Right. is a 68 y.o. male with multiple medical problems including COPD, A-fib, history of CHF, PVD status post bilateral BKAs, and esophageal cancer status post PEG and XRT.  Patient with several recent hospitalizations, most recently 06/29/2023 to 07/08/2023 with respiratory failure from RSV and/for aspiration pneumonia requiring intubation.  Patient now readmitted 08/22/23 with black tarry stools and HCAP.  EGD showed complete esophageal occlusion.  Palliative care consulted to address goals.   CODE STATUS: DNR  PAST MEDICAL HISTORY: Past Medical History:  Diagnosis Date   Arrhythmia    atrial fibrillation   Asthma    CHF (congestive heart failure) (HCC)    COPD (chronic obstructive pulmonary disease) (HCC)    Coronary artery disease    Depression    Diabetes mellitus without complication (HCC)    Gout    History anabolic steroid use    Hyperlipidemia    Hypertension    Hypogonadism in male    MI (myocardial infarction) (HCC)    Morbid obesity (HCC)    Myocardial infarction (HCC)    Peripheral vascular disease (HCC)    Perirectal abscess    Pleurisy    Sleep apnea    CPAP at night, no oxygen    Varicella     PAST SURGICAL HISTORY:  Past Surgical History:  Procedure Laterality Date   ABDOMINAL AORTIC ANEURYSM REPAIR     ACHILLES TENDON SURGERY Left 01/10/2021   Procedure: ACHILLES LENGTHENING/KIDNER;  Surgeon: Pink Bridges, DPM;  Location: [facility] ORS;  Service: Podiatry;  Laterality:  Left;   AMPUTATION Left 10/03/2021   Procedure: AMPUTATION BELOW KNEE;  Surgeon: Jackquelyn Mass, MD;  Location: [facility];  Service: Vascular;  Laterality: Left;   AMPUTATION Right 05/24/2022   Procedure: AMPUTATION BELOW KNEE;  Surgeon: Celso College, MD;  Location: [facility];  Service: General;  Laterality: Right;   AMPUTATION TOE Right 02/10/2016   Procedure: AMPUTATION TOE 3RD TOE;  Surgeon: Anell Baptist, DPM;  Location: [facility];  Service: Podiatry;  Laterality: Right;   AMPUTATION TOE Left 02/24/2020   Procedure: AMPUTATION TOE;  Surgeon: Pink Bridges, DPM;  Location: [facility];  Service: Podiatry;  Laterality: Left;   APPLICATION OF WOUND VAC Left 02/29/2020   Procedure: APPLICATION OF WOUND VAC;  Surgeon: Pink Bridges, DPM;  Location: [facility];  Service: Podiatry;  Laterality: Left;   BIOPSY  03/15/2023   Procedure: BIOPSY;  Surgeon: Luke Salaam, MD;  Location: [facility] ENDOSCOPY;  Service: Gastroenterology;;   BIOPSY  04/10/2023   Procedure: BIOPSY;  Surgeon: Corky Diener, Alphonsus Jeans, MD;  Location: [facility] ENDOSCOPY;  Service: Gastroenterology;;   CARDIAC CATHETERIZATION     CARDIOVERSION [name] 02/13/2022   Procedure: CARDIOVERSION;  Surgeon: Michelle Aid, MD;  Location: [facility];  Service: Cardiovascular;  Laterality: [name];   CARDIOVERSION [name] 04/23/2023   Procedure: CARDIOVERSION;  Surgeon: Devorah Fonder, MD;  Location: [facility];  Service: Cardiovascular;  Laterality: [name];   COLONOSCOPY WITH PROPOFOL  [name] 11/18/2015   Procedure: COLONOSCOPY WITH PROPOFOL ;  Surgeon: Cassie Click, MD;  Location: [facility] ENDOSCOPY;  Service: Endoscopy;  Laterality: [name];   CORONARY ARTERY BYPASS GRAFT     CORONARY STENT INTERVENTION [name] 02/02/2020   Procedure: CORONARY STENT INTERVENTION;  Surgeon: Percival Brace, MD;  Location: [facility] INVASIVE CV LAB;  Service: Cardiovascular;  Laterality: [name];   ESOPHAGOGASTRODUODENOSCOPY [name] 08/21/2023   Procedure: EGD (ESOPHAGOGASTRODUODENOSCOPY);  Surgeon: Marnee Sink, MD;  Location: [facility] ENDOSCOPY;  Service: Endoscopy;  Laterality: [name];   ESOPHAGOGASTRODUODENOSCOPY (EGD) WITH PROPOFOL  [name] 03/15/2023   Procedure: ESOPHAGOGASTRODUODENOSCOPY (EGD) WITH PROPOFOL ;  Surgeon: Luke Salaam, MD;  Location: [facility] ENDOSCOPY;  Service: Gastroenterology;  Laterality: [name];   FLEXIBLE SIGMOIDOSCOPY [name] 04/10/2023   Procedure: FLEXIBLE SIGMOIDOSCOPY;  Surgeon: Toledo, Alphonsus Jeans, MD;  Location: [facility] ENDOSCOPY;  Service: Gastroenterology;  Laterality: [name];   GASTROSTOMY [name] 03/18/2023   Procedure: INSERTION OF GASTROSTOMY TUBE;  Surgeon: Eldred Grego, MD;  Location: [facility];  Service: General;  Laterality: [name];   IMPACTION REMOVAL  03/15/2023   Procedure: IMPACTION REMOVAL;  Surgeon: Luke Salaam, MD;  Location: [facility] ENDOSCOPY;  Service: Gastroenterology;;   INCISION AND DRAINAGE Left 08/07/2021   Procedure: INCISION AND DRAINAGE-Partial Calcanectomy;  Surgeon: Pink Bridges, DPM;  Location: [facility];  Service: Podiatry;  Laterality: Left;   IRRIGATION AND DEBRIDEMENT FOOT Left 02/29/2020   Procedure: IRRIGATION AND DEBRIDEMENT FOOT;  Surgeon: Pink Bridges, DPM;  Location: [facility];  Service: Podiatry;  Laterality: Left;   IRRIGATION AND DEBRIDEMENT FOOT Left 02/24/2020   Procedure: IRRIGATION AND DEBRIDEMENT FOOT;  Surgeon: Pink Bridges, DPM;  Location: [facility];  Service: Podiatry;  Laterality: Left;   KNEE ARTHROSCOPY     LEFT HEART CATH AND CORS/GRAFTS ANGIOGRAPHY [name] 02/02/2020   Procedure: LEFT HEART CATH AND CORS/GRAFTS ANGIOGRAPHY;  Surgeon: Ronney Cola, MD;  Location: [facility] CV LAB;  Service: Cardiovascular;  Laterality: [name];   LOWER EXTREMITY ANGIOGRAPHY Left 02/25/2020   Procedure: Lower Extremity Angiography;  Surgeon: Celso College, MD;  Location: [facility];  Service: Cardiovascular;  Laterality: Left;   LOWER EXTREMITY ANGIOGRAPHY Left 01/04/2021   Procedure: LOWER EXTREMITY ANGIOGRAPHY;  Surgeon: Celso College, MD;  Location:  [facility];  Service: Cardiovascular;  Laterality: Left;   LOWER EXTREMITY ANGIOGRAPHY Right 05/21/2022   Procedure: Lower Extremity Angiography;  Surgeon: Celso College, MD;  Location: [facility];  Service: Cardiovascular;  Laterality: Right;   METATARSAL HEAD EXCISION Left 01/10/2021   Procedure: METATARSAL HEAD EXCISION - LEFT 5th;  Surgeon: Pink Bridges, DPM;  Location: [facility];  Service: Podiatry;  Laterality: Left;   PERIPHERAL VASCULAR CATHETERIZATION Right 01/24/2016   Procedure: Lower Extremity Angiography;  Surgeon: Jackquelyn Mass, MD;  Location: [facility];  Service: Cardiovascular;  Laterality: Right;   PERIPHERAL VASCULAR CATHETERIZATION Right 01/25/2016   Procedure: Lower Extremity Angiography;  Surgeon: Jackquelyn Mass, MD;  Location: [facility];  Service: Cardiovascular;  Laterality: Right;   PORTACATH PLACEMENT [name] 03/18/2023   Procedure: INSERTION PORT-A-CATH;  Surgeon: Eldred Grego, MD;  Location: [facility];  Service: General;  Laterality: [name];   TOE AMPUTATION     TONSILLECTOMY      HEMATOLOGY/ONCOLOGY HISTORY:  Oncology History Overview Note  IMPRESSION: 1. Distal esophageal cancer with extension into the proximal stomach and metastatic high left paratracheal and gastrohepatic ligament lymph nodes. 2. Chronic right perinephric hematoma with associated peripheral hypermetabolism. 3. Sludge and or gallstones. 4.  Aortic atherosclerosis (ICD10-I70.0). 5. Enlarged pulmonic trunk, indicative of pulmonary arterial hypertension.     Electronically Signed  By: Shearon Denis M.D.   On: 05/22/2023 15:15  # JAN 28th, 2025- Start single agent radiation no chemotherapy.   # COPD CHF CAD; chronic UTI; BKA ; diabetes   Esophageal adenocarcinoma (HCC)  03/15/2023 Initial Diagnosis   Esophageal adenocarcinoma (HCC)   04/25/2023 - 04/25/2023 Chemotherapy   Patient is on Treatment Plan : ESOPHAGUS Carboplatin +  Paclitaxel Weekly X 6 Weeks with XRT     Malignant neoplasm of lower third of esophagus (HCC)  04/11/2023 Initial Diagnosis   Malignant neoplasm of lower third of esophagus (HCC)   05/28/2023 Cancer Staging   Staging form: Esophagus - Adenocarcinoma, AJCC 8th Edition - Clinical: Stage IVB (cT4a, cN1, pM1) - Signed by Gwyn Leos, MD on 05/28/2023     ALLERGIES:  is allergic to penicillins, statins, and metformin  and related.  MEDICATIONS:  Current Facility-Administered Medications  Medication Dose Route Frequency Provider Last Rate Last Admin   acetaminophen  (TYLENOL ) tablet 650 mg  650 mg Per Tube Q6H PRN Lorita Rosa, MD       Or   acetaminophen  (TYLENOL ) suppository 650 mg  650 mg Rectal Q6H PRN Lorita Rosa, MD       albuterol  (PROVENTIL ) (2.5 MG/3ML) 0.083% nebulizer solution 2.5 mg  2.5 mg Nebulization Q2H PRN Marnee Sink, MD       amiodarone  (PACERONE ) tablet 200 mg  200 mg Per Tube Daily Lorita Rosa, MD       antiseptic oral rinse (BIOTENE) solution 15 mL  15 mL Topical PRN Lorita Rosa, MD       clonazePAM  (KLONOPIN ) tablet 0.5 mg  0.5 mg Per Tube BID Marnee Sink, MD   0.5 mg at 08/22/23 2125   diphenhydrAMINE  (BENADRYL ) injection 12.5 mg  12.5 mg Intravenous Q4H PRN Lorita Rosa, MD   12.5 mg at 08/22/23 2120   finasteride  (PROSCAR ) tablet 5 mg  5 mg Oral Daily Marnee Sink, MD   5 mg at 08/22/23 7829   gabapentin  (NEURONTIN ) 250 MG/5ML solution 300 mg  300 mg Per Tube QHS Yates, Jennifer, MD   300 mg at 08/23/23 0002   glycopyrrolate  (ROBINUL ) tablet 1 mg  1 mg Oral Q4H PRN Lorita Rosa, MD       Or   glycopyrrolate  (ROBINUL ) injection 0.2 mg  0.2 mg Subcutaneous Q4H PRN Lorita Rosa, MD       Or   glycopyrrolate  (ROBINUL ) injection 0.2 mg  0.2 mg Intravenous Q4H PRN Lorita Rosa, MD       haloperidol  (HALDOL ) tablet 0.5 mg  0.5 mg Oral Q4H PRN Lorita Rosa, MD       Or   haloperidol  (HALDOL ) 2 MG/ML solution 0.5 mg  0.5 mg Sublingual  Q4H PRN Lorita Rosa, MD       Or   haloperidol  lactate (HALDOL ) injection 0.5 mg  0.5 mg Intravenous Q4H PRN Lorita Rosa, MD   0.5 mg at 08/22/23 2355   levothyroxine  (SYNTHROID ) tablet 25 mcg  25 mcg Per Tube Q0600 Wohl, Darren, MD   25 mcg at 08/23/23 0556   LORazepam  (ATIVAN ) tablet 1 mg  1 mg Oral Q4H PRN Lorita Rosa, MD   1 mg at 08/22/23 2126   Or   LORazepam  (ATIVAN ) 2 MG/ML concentrated solution 1 mg  1 mg Sublingual Q4H PRN Lorita Rosa, MD       Or   LORazepam  (ATIVAN ) injection 1 mg  1 mg Intravenous Q4H PRN Lorita Rosa, MD       morphine  CONCENTRATE 10  mg / 0.5 ml oral solution 5 mg  5 mg Oral Q2H PRN Lorita Rosa, MD       Or   morphine  CONCENTRATE 10 mg / 0.5 ml oral solution 5 mg  5 mg Sublingual Q2H PRN Lorita Rosa, MD       nitroGLYCERIN  (NITROSTAT ) SL tablet 0.4 mg  0.4 mg Sublingual Q5 min PRN Marnee Sink, MD   0.4 mg at 08/21/23 8841   ondansetron  (ZOFRAN ) tablet 4 mg  4 mg Per Tube Q6H PRN Lorita Rosa, MD       Or   ondansetron  (ZOFRAN ) injection 4 mg  4 mg Intravenous Q6H PRN Lorita Rosa, MD       Oral care mouth rinse  15 mL Mouth Rinse PRN Lorita Rosa, MD       pantoprazole  (PROTONIX ) EC tablet 40 mg  40 mg Oral BID Lorita Rosa, MD   40 mg at 08/22/23 2126   polyvinyl alcohol  (LIQUIFILM TEARS) 1.4 % ophthalmic solution 1 drop  1 drop Both Eyes QID PRN Lorita Rosa, MD       pramipexole  (MIRAPEX ) tablet 2 mg  2 mg Per Tube QHS Marnee Sink, MD   2 mg at 08/22/23 2341   primidone  (MYSOLINE ) tablet 250 mg  250 mg Per Tube BID Marnee Sink, MD   250 mg at 08/22/23 2341   tamsulosin  (FLOMAX ) capsule 0.4 mg  0.4 mg Oral Daily Marnee Sink, MD   0.4 mg at 08/22/23 0928    VITAL SIGNS: BP 107/66 (BP Location: Left Arm)   Pulse (!) 101   Temp (!) 97.4 F (36.3 C) (Oral)   Resp 18   Ht 6\' 4"  (1.93 m)   Wt 251 lb 5.2 oz (114 kg)   SpO2 95%   BMI 30.59 kg/m  Filed Weights   08/21/23 2241 08/22/23 0500  Weight: 252 lb 13.9  oz (114.7 kg) 251 lb 5.2 oz (114 kg)    Estimated body mass index is 30.59 kg/m as calculated from the following:   Height as of this encounter: 6\' 4"  (1.93 m).   Weight as of this encounter: 251 lb 5.2 oz (114 kg).  LABS: CBC:    Component Value Date/Time   WBC 16.0 (H) 08/22/2023 1134   HGB 8.2 (L) 08/22/2023 1134   HGB 10.1 (L) 07/22/2023 1249   HGB 10.7 (L) 05/15/2022 1440   HCT 24.8 (L) 08/22/2023 1134   HCT 33.4 (L) 05/15/2022 1440   PLT 278 08/22/2023 1134   PLT 258 07/22/2023 1249   PLT 394 05/15/2022 1440   MCV 89.2 08/22/2023 1134   MCV 89 05/15/2022 1440   MCV 96 07/12/2011 0919   NEUTROABS 11.0 (H) 08/20/2023 2247   NEUTROABS 7.5 (H) 07/12/2011 0919   LYMPHSABS 0.5 (L) 08/20/2023 2247   LYMPHSABS 1.2 07/12/2011 0919   MONOABS 0.8 08/20/2023 2247   MONOABS 0.8 (H) 07/12/2011 0919   EOSABS 0.4 08/20/2023 2247   EOSABS 0.4 07/12/2011 0919   BASOSABS 0.1 08/20/2023 2247   BASOSABS 0.1 07/12/2011 0919   Comprehensive Metabolic Panel:    Component Value Date/Time   NA 135 08/21/2023 0500   NA 141 07/12/2011 0919   K 4.3 08/21/2023 0500   K 4.4 07/12/2011 0919   CL 101 08/21/2023 0500   CL 102 07/12/2011 0919   CO2 27 08/21/2023 0500   CO2 29 07/12/2011 0919   BUN 70 (H) 08/21/2023 0500   BUN 16 05/05/2013 1022   CREATININE 1.13 08/21/2023  0500   CREATININE 0.93 07/22/2023 1249   CREATININE 0.71 05/05/2013 1022   GLUCOSE 269 (H) 08/21/2023 0500   GLUCOSE 76 07/12/2011 0919   CALCIUM  8.7 (L) 08/21/2023 0500   CALCIUM  8.8 07/12/2011 0919   AST 26 08/20/2023 2247   AST 18 07/22/2023 1249   ALT 37 08/20/2023 2247   ALT 19 07/22/2023 1249   ALKPHOS 113 08/20/2023 2247   BILITOT 0.4 08/20/2023 2247   BILITOT 0.3 07/22/2023 1249   PROT 6.1 (L) 08/20/2023 2247   ALBUMIN  2.4 (L) 08/20/2023 2247    RADIOGRAPHIC STUDIES: DG Chest Port 1 View Result Date: 08/20/2023 CLINICAL DATA:  Shortness of breath EXAM: PORTABLE CHEST 1 VIEW COMPARISON:  07/15/2023  FINDINGS: Right chest wall Port-A-Cath tip in the mid SVC. Stable cardiomediastinal silhouette. Sternotomy and CABG. Ectasias of the ascending aorta. Interstitial and patchy airspace opacities in the left lower lung. No pleural effusion or pneumothorax. IMPRESSION: Interstitial and patchy airspace opacities in the left lower lung may atelectasis or pneumonia. Electronically Signed   By: Rozell Cornet M.D.   On: 08/20/2023 22:48   CT Abdomen Pelvis W Contrast Result Date: 08/19/2023 CLINICAL DATA:  Follow-up right renal parenchymal laceration or retroperitoneal hematoma EXAM: CT ABDOMEN AND PELVIS WITH CONTRAST TECHNIQUE: Multidetector CT imaging of the abdomen and pelvis was performed using the standard protocol following bolus administration of intravenous contrast. RADIATION DOSE REDUCTION: This exam was performed according to the departmental dose-optimization program which includes automated exposure control, adjustment of the mA and/or kV according to patient size and/or use of iterative reconstruction technique. CONTRAST:  OMNIPAQUE  IOHEXOL  300 MG/ML  SOLN COMPARISON:  June 14, 2023 FINDINGS: Lower chest: Small bilateral pleural reactions and minute right pleural effusion with minimal basilar hypoventilatory atelectasis Hepatobiliary: No focal liver abnormality is seen. No gallstones, gallbladder wall thickening, or biliary dilatation. Pancreas: Unremarkable. No pancreatic ductal dilatation or surrounding inflammatory changes. Spleen: Normal in size without focal abnormality. No change in the capsular calcification of the spleen about 1 cm in maximum diameter. Adrenals/Urinary Tract: Comparison with prior examination accounting for differences in techniques again noted is the presence of a complex bilobed fluid collection in the right posterior retroperitoneal soft tissues extending into the adjacent iliopsoas muscle and right lumbar muscle. Measuring 10 cm in maximum transverse diameter, 3  point 7 cm in AP diameter and 10 cm in longitudinal diameter. Overall appears unchanged since prior examination. Slightly displaced in the lower pole of the right kidney anteriorly. Collection appears better defined more hypodense and could correlate with resolving hematoma. Small amount of fluid of the hepatorenal fossa with minimal stranding similar but decreased since prior examination. Left kidney is unremarkable. The left adrenal gland remains enlarged similar to prior examination better defined on the coronal images measuring about 1.7 x 2 cm in diameter. Follow-up recommended. Stomach/Bowel: Stomach is within normal limits. Appendix appears normal. No evidence of bowel wall thickening, distention, or inflammatory changes. Abundant residual fecal material throughout the left side of the colon and rectosigmoid colon with mild fecal impaction in the rectum Gastrostomy tube in the stomach in good position Vascular/Lymphatic: Aortic atherosclerosis. No enlarged abdominal or pelvic lymph nodes. Reproductive: Prostate is unremarkable. Other: No abdominal wall hernia or abnormality. No abdominopelvic ascites. Musculoskeletal: No acute or significant osseous findings. IMPRESSION: *No change in the size of the complex bilobed fluid collection in the right posterior retroperitoneal soft tissues extending into the adjacent iliopsoas muscle and right lumbar muscle. Collection appears better defined more hypodense and  could correlate with resolving hematoma. *Small amount of fluid in the hepatorenal fossa with minimal stranding similar but decreased since prior examination. *Left adrenal gland remains enlarged similar to prior examination better defined on the coronal images measuring about 1.7 x 2 cm in diameter. Follow-up recommended. *Abundant residual fecal material throughout the left side of the colon and rectosigmoid colon with mild fecal impaction in the rectum. *Small bilateral pleural reactions and minute right  pleural effusion with minimal basilar hypoventilatory atelectasis. *Aortic atherosclerosis. Electronically Signed   By: Fredrich Jefferson M.D.   On: 08/19/2023 19:20    PERFORMANCE STATUS (ECOG) : 4 - Bedbound  Review of Systems Unless otherwise noted, a complete review of systems is negative.  Physical Exam General: frail appearing Pulmonary: unlabored Extremities: B AKA Skin: no rashes Neurological: Weakness but otherwise nonfocal  IMPRESSION: Follow up visit.   I met with patient's brother, Lyell Samuel.  Lyell Samuel states that he recognizes that patient is nearing end-of-life and plan is to focus on comfort.  He is also in agreement with hospice and states that he will try to support patient's wife after discharge.  I also called and spoke with patient's sister-in-law.  Symptomatically, patient is comfortable appearing today.  PLAN: -Home with hospice -Plan for comfort  Case and plan discussed with Dr. Valentine Gasmen   Time Total: 20 minutes  Visit consisted of counseling and education dealing with the complex and emotionally intense issues of symptom management and palliative care in the setting of serious and potentially life-threatening illness.Greater than 50%  of this time was spent counseling and coordinating care related to the above assessment and plan.  Signed by: Gerilyn Kobus, PhD, NP-C

## 2023-08-23 NOTE — TOC Transition Note (Signed)
 Transition of Care [facility]) - Discharge Note   Patient Details  Name: [name]. MRN: [id number] Date of Birth: 08-Mar-1956  [facility]) CM/SW Contact:  Elsie Halo, RN Phone Number: 08/23/2023, 3:36 PM   Clinical Narrative:    Patient is medically clear for dc to home with [facility]. Fredrik Jensen with [facility] initially accepted the referral and Deborrah Fam is following. Transportation arranged with [facility].    Final next level of care: Home w Hospice Care     Patient Goals and CMS Choice            Discharge Placement                       Discharge Plan and Services Additional resources added to the After Visit Summary for                              [facility] Agency: Other - See comment Lennart Quitter of [place]) Date [facility] Agency Contacted: 08/22/23 Time HH Agency Contacted: [phone] Representative spoke with at [facility] Agency: Fredrik Jensen and [place]  Social Drivers of Health (SDOH) Interventions SDOH Screenings   Food Insecurity: No Food Insecurity (08/21/2023)  Housing: Low Risk  (08/21/2023)  Transportation Needs: No Transportation Needs (08/21/2023)  Utilities: Not At Risk (08/21/2023)  Depression (PHQ2-9): Low Risk  (11/21/2021)  Financial Resource Strain: Low Risk  (09/28/2022)   Received from [facility]  Social Connections: Socially Isolated (08/21/2023)  Tobacco Use: Medium Risk (08/21/2023)     Readmission Risk Interventions    02/13/2022    4:57 PM 09/29/2021   11:12 AM  Readmission Risk Prevention Plan  Transportation Screening Complete Complete  PCP or Specialist Appt within 3-5 Days  Complete  HRI or Home Care Consult  Complete  Social Work Consult for Recovery Care Planning/Counseling  Complete  Palliative Care Screening  Not Applicable  Medication Review Oceanographer) Complete Complete  PCP or Specialist appointment within 3-5 days of discharge Complete   HRI or Home Care Consult Complete   SW Recovery  Care/Counseling Consult Complete   Palliative Care Screening Not Applicable   Skilled Nursing Facility Not Applicable

## 2023-08-23 NOTE — Progress Notes (Signed)
 Patient has been very confused. He was determined to get out of the bed and "go upstairs." He kept rattling his side rails and asking his girlfriend to get up and help him. He did not respond to reorientation. At one point he asked staff to take him to the funeral home because he was dying.  He removed his Purewick and was tugging at his IV line stating he was just screwing it in. His clonopin and ativan  did not calm him or deter his risky behavior.  He was given Haldol  and finally went to sleep. He has not voided since pulling his Purewick. His bladder was scanned and to only showed 140 ml of urine

## 2023-08-23 NOTE — Discharge Summary (Addendum)
 Physician Discharge Summary   Patient: [name]. MRN: [id number] DOB: 10/15/1955  Admit date:     08/20/2023  Discharge date: 08/23/23  Discharge Physician: Lorita Rosa   PCP: Yehuda Helms, MD   Recommendations at discharge:   You are being discharged home with hospice  Discharge Diagnoses: Principal Problem:   Upper gastrointestinal bleed Active Problems:   Acute respiratory failure with hypoxia (HCC)   HCAP (healthcare-associated pneumonia)   Acute blood loss anemia   Chronic anticoagulation   Paroxysmal atrial fibrillation (HCC)   Uncontrolled type 2 diabetes mellitus with hyperglycemia, with long-term current use of insulin  (HCC)   Perinephric hematoma   Essential hypertension   Malignant neoplasm of lower third of esophagus (HCC)   Gastrostomy tube dependent (HCC)   PAD (peripheral artery disease) (HCC)   Obesity, Class III, BMI 40-49.9 (morbid obesity) (HCC)   OSA (obstructive sleep apnea)   CAD with Hx of CABG   Chronic diastolic CHF (congestive heart failure) (HCC)   BPH (benign prostatic hyperplasia)   SIRS (systemic inflammatory response syndrome) [facility])    Hospital Course: 68yo with h/o CAD, afib, chronic HFpEF, esophageal CA s/p chemo/rads with G-tube dependence, PAD s/p B AKA, DM, renal mass with chronic hematuria (last seen by urology 4/16), OSA, and morbid obesity who presented on 4/22 with SOB.  He was last hospitalized from 3/1-10 with respiratory failure from RSV and/or aspiration PNA requiring intubation but was weaned off O2 prior to dc.  Sat 88% on RA.  Reported 4 days of black stools, Hgb 7 (10 a month ago).  CXR with interstitial and patchy infiltrates in LLL, started on Cefepime  and Vanc for HCAP.  Transfused 1 unit PRBC overnight on 4/23 and then 2 more on 4/24.  EGD with complete esophageal occlusion, unable to pass scope.  Oncology consulted and agrees with hospice/DNR.  Palliative care consulting.    Assessment and Plan:  End  of life care Patient with a known h/o esophageal cancer with recent completion of radiation therapy Presented with UGI bleed with ABLA He was transfused but remained anemic EGD performed showing complete esophageal obstruction He is not a candidate for further treatment and is likely to continue to have worsening anemia As such, hospice was recommended After prolonged GOC conversations, patient and family are in agreement with plan for home with hospice He will be discharged today to home by ambulance after delivery of his hospital bed  Acute blood loss anemia Melena, with history of esophageal cancer/UGI bleed Hemoglobin 7 and became hypotensive following admission Transfused 1 unit PRBC and then 2 more for Hgb goal >10 GI consulted EGD performed - completely obstructing, malignant esophageal tumor in the lower third of the esophagus No NSAIDs indefinitely Hold Eliquis     Possible sepsis with acute hypoxic respiratory failure Recent admission with PNA requiring mechanical ventilation Sepsis criteria include fever to 101.1, tachycardia, leukocytosis, hypotension, hypoxia-O2 sat 88% on room air with EMS Given known malignancy and active GI bleed, infection is unlikely; hypoxia is likely related to anemia CXR with LLL opacities but patient has no respiratory/URI symptoms (just the hypoxia as above) Patient does have history of Klebsiella UTI 03/2023 and C. difficile 03/2023 but UA with 30 protein, large LE, otherwise WNL so not obviously infected Cefepime  and vancomycin  ordered to treat, have been discontinued   Perinephric hematoma Chronic perinephric hematoma with renal mass Hemoglobin 7, down from 10 months prior CT abdomen from 08/08/23 ordered by urology showed: "No change in  the size of the complex bilobed fluid collection in the Wood posterior retroperitoneal soft tissues extending into the adjacent iliopsoas muscle and Wood lumbar muscle. Collection appears better defined more  hypodense and could correlate with resolving hematoma" Hold systemic anticoagulation for now pending stability of hemoglobin "Given his terminal cancer diagnosis and comorbidities, conservative management is recommended"   Malignant neoplasm of lower third of esophagus Stage T4N1M1 stage 4 distal esophageal cancer, now completely obstructing esophagus despite recent completion of radiation, unresectable G-tube dependent - not receiving tube feeds His prognosis is very poor Dr. Valentine Gasmen and [facility] consulting - patient is recommended for hospice   Uncontrolled type 2 diabetes mellitus with hyperglycemia, with long-term current use of insulin  Last A1c was 7.8, suboptimal control Stop basal insulin  and SSI   Paroxysmal atrial fibrillation  Holding anticoagulation due to suspected acute blood loss Continue amiodarone  for rate control for now Hold diltiazem  due to soft blood pressures    Essential hypertension Currently hypotensive Holding home antihypertensives of diltiazem , furosemide     CAD/PAD S/p CABG x 2 2006, PCI with DES 2021 s/p bilateral AKA Holding Eliquis , due to anemia  Hold ezetimibe  He has been unable to transfer himself since 03/2023 Continue Klonopin , Primidone , Mirapex  for now   OSA (obstructive sleep apnea) CPAP nightly if desired   Obesity, Class 1 Complicating factor to overall prognosis and care   BPH (benign prostatic hyperplasia) Continue finasteride  and tamsulosin  for now Last seen by urologist 08/14/2023   Hypothyroidism Continue Synthroid  for now  Pressure ulcer Pressure Injury 08/21/23 Buttocks Wood Stage 2 -  Partial thickness loss of dermis presenting as a shallow open injury with a red, pink wound bed without slough. Open area with white covering wound base (Active)  08/21/23 2241  Location: Buttocks  Location Orientation: Wood  Staging: Stage 2 -  Partial thickness loss of dermis presenting as a shallow open injury with a red, pink  wound bed without slough.  Wound Description (Comments): Open area with white covering wound base  Present on Admission: Yes            Consultants: GI Oncology Palliative care   Procedures: EGD 4/23   Antibiotics: Cefepime  4/22- Vancomycin  4/22-23    Disposition: Hospice care at home Diet recommendation:  Clear liquid diet DISCHARGE MEDICATION: Allergies as of 08/23/2023       Reactions   Penicillins Other (See Comments)   Happened at 68 years old and pt. stated he passed out  He has tolerated amoxicillin /clavulanate and ampicillin /sulbactam   Statins    Other reaction(s): Muscle Pain Causes legs to ache per pt   Metformin  And Related Diarrhea        Medication List     STOP taking these medications    apixaban  5 MG Tabs tablet Commonly known as: Eliquis    ascorbic acid  500 MG tablet Commonly known as: VITAMIN C    Cholecalciferol  25 MCG (1000 UT) tablet   diltiazem  300 MG 24 hr capsule Commonly known as: CARDIZEM  CD   ezetimibe  10 MG tablet Commonly known as: ZETIA    feeding supplement (NEPRO CARB STEADY) Liqd   ferrous sulfate  300 (60 Fe) MG/5ML syrup   fiber supplement (BANATROL TF) liquid   HumaLOG 100 UNIT/ML injection Generic drug: insulin  lispro   Lantus  100 UNIT/ML injection Generic drug: insulin  glargine   multivitamin with minerals tablet   nitrofurantoin  (macrocrystal-monohydrate) 100 MG capsule Commonly known as: MACROBID    Vitamin B-12 5000 MCG Tbdp   zinc  sulfate (50mg  elemental  zinc ) 220 (50 Zn) MG capsule       TAKE these medications    acetaminophen  325 MG tablet Commonly known as: TYLENOL  Place 2 tablets (650 mg total) into feeding tube every 6 (six) hours as needed for mild pain (pain score 1-3) (or Fever >/= 101).   amiodarone  200 MG tablet Commonly known as: PACERONE  Take twice a day through 1/7 and then once daily What changed:  how much to take how to take this when to take this additional  instructions   clonazePAM  0.5 MG tablet Commonly known as: KLONOPIN  Place 1 tablet (0.5 mg total) into feeding tube 2 (two) times daily.   Dermacloud Oint Apply 1 Application topically daily as needed.   diphenoxylate -atropine  2.5-0.025 MG tablet Commonly known as: LOMOTIL  Place 1 tablet into feeding tube 4 (four) times daily.   finasteride  5 MG tablet Commonly known as: PROSCAR  Take 1 tablet by mouth daily.   free water  Soln Place 80 mLs into feeding tube 6 (six) times daily.   gabapentin  300 MG capsule Commonly known as: NEURONTIN  Take 1 capsule by mouth at bedtime.   levothyroxine  25 MCG tablet Commonly known as: SYNTHROID  Place 1 tablet (25 mcg total) into feeding tube daily at 6 (six) AM.   lidocaine -prilocaine  cream Commonly known as: EMLA  Apply 1 Application topically as needed.   loperamide  HCl 1 MG/7.5ML suspension Commonly known as: IMODIUM  Place 15 mLs (2 mg total) into feeding tube every 6 (six) hours as needed for diarrhea or loose stools.   nitroGLYCERIN  0.4 MG SL tablet Commonly known as: NITROSTAT  Place 1 tablet (0.4 mg total) under the tongue every 5 (five) minutes as needed for chest pain.   nystatin  powder Commonly known as: MYCOSTATIN /NYSTOP  Apply 1 Application topically 2 (two) times daily. Under belly   ondansetron  4 MG tablet Commonly known as: ZOFRAN  Place 1 tablet (4 mg total) into feeding tube every 8 (eight) hours as needed for nausea.   pantoprazole  40 MG tablet Commonly known as: PROTONIX  Take 1 tablet (40 mg total) by mouth 2 (two) times daily.   polyethylene glycol 17 g packet Commonly known as: MIRALAX  / GLYCOLAX  Place 17 g into feeding tube daily as needed for moderate constipation.   pramipexole  1 MG tablet Commonly known as: MIRAPEX  Place 2 tablets (2 mg total) into feeding tube at bedtime.   primidone  250 MG tablet Commonly known as: MYSOLINE  Place 1 tablet (250 mg total) into feeding tube 2 (two) times daily.    tamsulosin  0.4 MG Caps capsule Commonly known as: FLOMAX  Take 0.4 mg by mouth.   vitamin A & D ointment Apply 1 Application topically as needed for dry skin. Apply to stumps               Durable Medical Equipment  (From admission, onward)           Start     Ordered   08/22/23 1601  For home use only DME Hospital bed  Once       Question Answer Comment  Length of Need Lifetime   The above medical condition requires: Patient requires the ability to reposition frequently   Bed type Semi-electric   Trapeze Bar Yes   Support Surface: Low Air loss Mattress      08/22/23 1601            Discharge Exam:    Subjective: Seen with his brother.  Appears to understand to some extent the situation.   Objective: Vitals:  08/22/23 2013 08/23/23 0528  BP: 117/61 107/66  Pulse: (!) 103 (!) 101  Resp: 18 18  Temp: (!) 97.5 F (36.4 C) (!) 97.4 F (36.3 C)  SpO2: 96% 95%    Intake/Output Summary (Last 24 hours) at 08/23/2023 1154 Last data filed at 08/23/2023 0530 Gross per 24 hour  Intake --  Output 600 ml  Net -600 ml   Filed Weights   08/21/23 2241 08/22/23 0500  Weight: 114.7 kg 114 kg    Exam:  General:  Appears chronically ill, pale; calm now but agitated overnight (patient is unaware of that) Eyes:   EOMI, normal lids, iris, pale conjunctivae ENT:  grossly normal hearing, lips & tongue, mmm Cardiovascular:  RRR.  Respiratory:   CTA bilaterally with no wheezes/rales/rhonchi.  Normal respiratory effort. Abdomen:  soft, NT, ND; G tube in place Skin:  no rash or induration seen on limited exam Musculoskeletal:  s/p B BKA; stumps C/D/I Psychiatric: blunted mood and affect, speech fluent and appropriate Neurologic:  CN 2-12 grossly intact, moves all extremities in coordinated fashion  Data Reviewed: I have reviewed the patient's lab results since admission.  Pertinent labs for today include:   None    Condition at discharge: poor  The  results of significant diagnostics from this hospitalization (including imaging, microbiology, ancillary and laboratory) are listed below for reference.   Imaging Studies: DG Chest Port 1 View Result Date: 08/20/2023 CLINICAL DATA:  Shortness of breath EXAM: PORTABLE CHEST 1 VIEW COMPARISON:  07/15/2023 FINDINGS: Wood chest wall Port-A-Cath tip in the mid SVC. Stable cardiomediastinal silhouette. Sternotomy and CABG. Ectasias of the ascending aorta. Interstitial and patchy airspace opacities in the left lower lung. No pleural effusion or pneumothorax. IMPRESSION: Interstitial and patchy airspace opacities in the left lower lung may atelectasis or pneumonia. Electronically Signed   By: Rozell Cornet M.D.   On: 08/20/2023 22:48   CT Abdomen Pelvis W Contrast Result Date: 08/19/2023 CLINICAL DATA:  Follow-up Wood renal parenchymal laceration or retroperitoneal hematoma EXAM: CT ABDOMEN AND PELVIS WITH CONTRAST TECHNIQUE: Multidetector CT imaging of the abdomen and pelvis was performed using the standard protocol following bolus administration of intravenous contrast. RADIATION DOSE REDUCTION: This exam was performed according to the departmental dose-optimization program which includes automated exposure control, adjustment of the mA and/or kV according to patient size and/or use of iterative reconstruction technique. CONTRAST:  OMNIPAQUE  IOHEXOL  300 MG/ML  SOLN COMPARISON:  June 14, 2023 FINDINGS: Lower chest: Small bilateral pleural reactions and minute Wood pleural effusion with minimal basilar hypoventilatory atelectasis Hepatobiliary: No focal liver abnormality is seen. No gallstones, gallbladder wall thickening, or biliary dilatation. Pancreas: Unremarkable. No pancreatic ductal dilatation or surrounding inflammatory changes. Spleen: Normal in size without focal abnormality. No change in the capsular calcification of the spleen about 1 cm in maximum diameter. Adrenals/Urinary Tract:  Comparison with prior examination accounting for differences in techniques again noted is the presence of a complex bilobed fluid collection in the Wood posterior retroperitoneal soft tissues extending into the adjacent iliopsoas muscle and Wood lumbar muscle. Measuring 10 cm in maximum transverse diameter, 3 point 7 cm in AP diameter and 10 cm in longitudinal diameter. Overall appears unchanged since prior examination. Slightly displaced in the lower pole of the Wood kidney anteriorly. Collection appears better defined more hypodense and could correlate with resolving hematoma. Small amount of fluid of the hepatorenal fossa with minimal stranding similar but decreased since prior examination. Left kidney is unremarkable. The left adrenal gland remains  enlarged similar to prior examination better defined on the coronal images measuring about 1.7 x 2 cm in diameter. Follow-up recommended. Stomach/Bowel: Stomach is within normal limits. Appendix appears normal. No evidence of bowel wall thickening, distention, or inflammatory changes. Abundant residual fecal material throughout the left side of the colon and rectosigmoid colon with mild fecal impaction in the rectum Gastrostomy tube in the stomach in good position Vascular/Lymphatic: Aortic atherosclerosis. No enlarged abdominal or pelvic lymph nodes. Reproductive: Prostate is unremarkable. Other: No abdominal wall hernia or abnormality. No abdominopelvic ascites. Musculoskeletal: No acute or significant osseous findings. IMPRESSION: *No change in the size of the complex bilobed fluid collection in the Wood posterior retroperitoneal soft tissues extending into the adjacent iliopsoas muscle and Wood lumbar muscle. Collection appears better defined more hypodense and could correlate with resolving hematoma. *Small amount of fluid in the hepatorenal fossa with minimal stranding similar but decreased since prior examination. *Left adrenal gland remains enlarged  similar to prior examination better defined on the coronal images measuring about 1.7 x 2 cm in diameter. Follow-up recommended. *Abundant residual fecal material throughout the left side of the colon and rectosigmoid colon with mild fecal impaction in the rectum. *Small bilateral pleural reactions and minute Wood pleural effusion with minimal basilar hypoventilatory atelectasis. *Aortic atherosclerosis. Electronically Signed   By: Fredrich Jefferson M.D.   On: 08/19/2023 19:20    Microbiology: Results for orders placed or performed during the hospital encounter of 08/20/23  Blood culture (routine x 2)     Status: None (Preliminary result)   Collection Time: 08/20/23 10:47 PM   Specimen: BLOOD  Result Value Ref Range Status   Specimen Description BLOOD BLOOD Wood FOREARM  Final   Special Requests   Final    BOTTLES DRAWN AEROBIC AND ANAEROBIC Blood Culture adequate volume   Culture   Final    NO GROWTH 3 DAYS Performed at [facility], [address] [place], [place] [address]    Report Status PENDING  Incomplete  Resp panel by RT-PCR (RSV, Flu A&B, Covid) Anterior Nasal Swab     Status: None   Collection Time: 08/20/23 10:47 PM   Specimen: Anterior Nasal Swab  Result Value Ref Range Status   SARS Coronavirus 2 by RT PCR NEGATIVE NEGATIVE Final    Comment: (NOTE) SARS-CoV-2 target nucleic acids are NOT DETECTED.  The SARS-CoV-2 RNA is generally detectable in upper respiratory specimens during the acute phase of infection. The lowest concentration of SARS-CoV-2 viral copies this assay can detect is 138 copies/mL. A negative result does not preclude SARS-Cov-2 infection and should not be used as the sole basis for treatment or other patient management decisions. A negative result may occur with  improper specimen collection/handling, submission of specimen other than nasopharyngeal swab, presence of viral mutation(s) within the areas targeted by this assay, and inadequate  number of viral copies(<138 copies/mL). A negative result must be combined with clinical observations, patient history, and epidemiological information. The expected result is Negative.  Fact Sheet for Patients:  BloggerCourse.com  Fact Sheet for Healthcare Providers:  SeriousBroker.it  This test is no t yet approved or cleared by the [place]  FDA and  has been authorized for detection and/or diagnosis of SARS-CoV-2 by FDA under an Emergency Use Authorization (EUA). This EUA will remain  in effect (meaning this test can be used) for the duration of the COVID-19 declaration under Section 564(b)(1) of the Act, 21 U.S.C.section 360bbb-3(b)(1), unless the authorization is terminated  or revoked  sooner.       Influenza A by PCR NEGATIVE NEGATIVE Final   Influenza B by PCR NEGATIVE NEGATIVE Final    Comment: (NOTE) The Xpert Xpress SARS-CoV-2/FLU/RSV plus assay is intended as an aid in the diagnosis of influenza from Nasopharyngeal swab specimens and should not be used as a sole basis for treatment. Nasal washings and aspirates are unacceptable for Xpert Xpress SARS-CoV-2/FLU/RSV testing.  Fact Sheet for Patients: BloggerCourse.com  Fact Sheet for Healthcare Providers: SeriousBroker.it  This test is not yet approved or cleared by the [place]  FDA and has been authorized for detection and/or diagnosis of SARS-CoV-2 by FDA under an Emergency Use Authorization (EUA). This EUA will remain in effect (meaning this test can be used) for the duration of the COVID-19 declaration under Section 564(b)(1) of the Act, 21 U.S.C. section 360bbb-3(b)(1), unless the authorization is terminated or revoked.     Resp Syncytial Virus by PCR NEGATIVE NEGATIVE Final    Comment: (NOTE) Fact Sheet for Patients: BloggerCourse.com  Fact Sheet for Healthcare  Providers: SeriousBroker.it  This test is not yet approved or cleared by the [place]  FDA and has been authorized for detection and/or diagnosis of SARS-CoV-2 by FDA under an Emergency Use Authorization (EUA). This EUA will remain in effect (meaning this test can be used) for the duration of the COVID-19 declaration under Section 564(b)(1) of the Act, 21 U.S.C. section 360bbb-3(b)(1), unless the authorization is terminated or revoked.  Performed at [facility], [address] [place], [place] [address]   Blood culture (routine x 2)     Status: None (Preliminary result)   Collection Time: 08/21/23  2:28 AM   Specimen: BLOOD  Result Value Ref Range Status   Specimen Description BLOOD BLOOD Wood ARM  Final   Special Requests   Final    BOTTLES DRAWN AEROBIC AND ANAEROBIC Blood Culture adequate volume   Culture   Final    NO GROWTH 2 DAYS Performed at [facility], [address] [place], [place] [address]    Report Status PENDING  Incomplete  MRSA Next Gen by PCR, Nasal     Status: Abnormal   Collection Time: 08/21/23 12:05 PM   Specimen: Nasal Mucosa; Nasal Swab  Result Value Ref Range Status   MRSA by PCR Next Gen DETECTED (A) NOT DETECTED Final    Comment: RESULT CALLED TO, READ BACK BY AND VERIFIED WITH: DEVYN TAYLOR, RN 1450 08/21/23 GM (NOTE) The GeneXpert MRSA Assay (FDA approved for NASAL specimens only), is one component of a comprehensive MRSA colonization surveillance program. It is not intended to diagnose MRSA infection nor to guide or monitor treatment for MRSA infections. Test performance is not FDA approved in patients less than 35 years old. Performed at [facility], [address] [place], [place] [address]     Labs: CBC: Recent Labs  Lab 08/20/23 2247 08/21/23 0500 08/21/23 0927 08/21/23 2220 08/22/23 1134  WBC 12.8*  --  12.8* 10.1 16.0*  NEUTROABS 11.0*  --   --   --   --   HGB  7.0* 6.1* 6.0* 6.9* 8.2*  HCT 23.2*  --  19.5* 21.4* 24.8*  MCV 93.2  --  93.8 92.6 89.2  PLT 279  --  201 154 278   Basic Metabolic Panel: Recent Labs  Lab 08/20/23 2247 08/21/23 0500  NA 138 135  K 3.8 4.3  CL 104 101  CO2 25 27  GLUCOSE 235* 269*  BUN 59* 70*  CREATININE 0.95 1.13  CALCIUM  8.2* 8.7*   Liver Function Tests: Recent Labs  Lab 08/20/23 2247  AST 26  ALT 37  ALKPHOS 113  BILITOT 0.4  PROT 6.1*  ALBUMIN  2.4*   CBG: Recent Labs  Lab 08/21/23 0955 08/21/23 2301 08/22/23 1155 08/22/23 1604  GLUCAP 194* 147* 179* 198*    Discharge time spent: greater than 30 minutes.  Signed: Lorita Rosa, MD [facility] 08/23/2023

## 2023-08-23 NOTE — TOC Transition Note (Addendum)
 Transition of Care [facility]) - Discharge Note   Patient Details  Name: [name]. MRN: [id number] Date of Birth: 1955/06/15  Transition of Care [facility]) CM/SW Contact:  Alessa Mazur C Kalel Harty, RN Phone Number: 08/23/2023, 12:07 PM   Clinical Narrative:    Spoke with patient's wife, [name], to confirm discharge plan for today. She stated she has been in contact with [place] from [facility] and is waiting on patient's bed to be delivered.   Face sheet and medcial necessity forms printed to the floor to be added to the EMS packet.          Patient Goals and CMS Choice            Discharge Placement                       Discharge Plan and Services Additional resources added to the After Visit Summary for                                       Social Drivers of Health (SDOH) Interventions SDOH Screenings   Food Insecurity: No Food Insecurity (08/21/2023)  Housing: Low Risk  (08/21/2023)  Transportation Needs: No Transportation Needs (08/21/2023)  Utilities: Not At Risk (08/21/2023)  Depression (PHQ2-9): Low Risk  (11/21/2021)  Financial Resource Strain: Low Risk  (09/28/2022)   Received from [facility]  Social Connections: Socially Isolated (08/21/2023)  Tobacco Use: Medium Risk (08/21/2023)     Readmission Risk Interventions    02/13/2022    4:57 PM 09/29/2021   11:12 AM  Readmission Risk Prevention Plan  Transportation Screening Complete Complete  PCP or Specialist Appt within 3-5 Days  Complete  HRI or Home Care Consult  Complete  Social Work Consult for Recovery Care Planning/Counseling  Complete  Palliative Care Screening  Not Applicable  Medication Review Oceanographer) Complete Complete  PCP or Specialist appointment within 3-5 days of discharge Complete   HRI or Home Care Consult Complete   SW Recovery Care/Counseling Consult Complete   Palliative Care Screening Not Applicable   Skilled Nursing Facility Not Applicable

## 2023-08-24 ENCOUNTER — Encounter: Payer: Self-pay | Admitting: Internal Medicine

## 2023-08-24 LAB — TYPE AND SCREEN
ABO/RH(D): O NEG
Antibody Screen: NEGATIVE
Unit division: 0
Unit division: 0
Unit division: 0
Unit division: 0
Unit division: 0

## 2023-08-24 LAB — BPAM RBC
Blood Product Expiration Date: 202505252359
Blood Product Expiration Date: 202505252359
Blood Product Expiration Date: 202505252359
Blood Product Expiration Date: 202505252359
Blood Product Expiration Date: 202505252359
ISSUE DATE / TIME: 202504230520
ISSUE DATE / TIME: 202504231306
ISSUE DATE / TIME: 202504231805
Unit Type and Rh: 5100
Unit Type and Rh: 5100
Unit Type and Rh: 5100
Unit Type and Rh: 5100
Unit Type and Rh: 5100

## 2023-08-24 LAB — PREPARE RBC (CROSSMATCH)

## 2023-08-24 NOTE — Progress Notes (Signed)
 On 4/25-met with patient, discussed regarding hospice plan.  He is in agreement.  I spoke to patient's wife Valinda Gault, patient is currently discharged home on hospice.  As per the wife hospice just enrolled him into the services.   GB

## 2023-08-25 LAB — CULTURE, BLOOD (ROUTINE X 2)
Culture: NO GROWTH
Special Requests: ADEQUATE

## 2023-08-26 ENCOUNTER — Other Ambulatory Visit: Payer: Self-pay | Admitting: Cardiovascular Disease

## 2023-08-26 LAB — CULTURE, BLOOD (ROUTINE X 2)
Culture: NO GROWTH
Special Requests: ADEQUATE

## 2023-08-27 ENCOUNTER — Other Ambulatory Visit: Payer: Self-pay

## 2023-08-27 ENCOUNTER — Telehealth: Payer: Self-pay | Admitting: *Deleted

## 2023-08-27 NOTE — Telephone Encounter (Signed)
 Wife called to stop all appts for cancer center . I will try to get the scan tomorrow since every one  has gone now.his wife said that that the cancer center does a good job.

## 2023-08-29 ENCOUNTER — Ambulatory Visit: Admitting: Radiation Oncology

## 2023-08-29 ENCOUNTER — Telehealth: Payer: Self-pay | Admitting: Cardiovascular Disease

## 2023-08-29 NOTE — Telephone Encounter (Signed)
 Wife Catherin Closs) called to report patient is now in hospice care.

## 2023-09-05 ENCOUNTER — Inpatient Hospital Stay: Payer: Medicare Other

## 2023-09-17 ENCOUNTER — Ambulatory Visit: Payer: Medicare Other | Admitting: Cardiovascular Disease

## 2023-09-24 ENCOUNTER — Ambulatory Visit

## 2023-09-27 ENCOUNTER — Telehealth: Payer: Self-pay | Admitting: Internal Medicine

## 2023-09-27 NOTE — Telephone Encounter (Signed)
 I called patient's wife and offered my condolences.  FYI-

## 2023-09-29 DEATH — deceased

## 2023-09-30 ENCOUNTER — Ambulatory Visit: Admitting: Internal Medicine

## 2023-09-30 ENCOUNTER — Other Ambulatory Visit

## 2024-02-10 NOTE — Telephone Encounter (Signed)
 Erroneous encounter
